# Patient Record
Sex: Female | Born: 1950 | Race: Black or African American | Hispanic: No | Marital: Married | Smoking: Former smoker
Health system: Southern US, Community
[De-identification: ages and names within clinical notes are randomized; demographics above are authoritative.]

## PROBLEM LIST (undated history)

## (undated) DIAGNOSIS — I480 Paroxysmal atrial fibrillation: Secondary | ICD-10-CM

## (undated) DIAGNOSIS — E119 Type 2 diabetes mellitus without complications: Secondary | ICD-10-CM

## (undated) DIAGNOSIS — R7881 Bacteremia: Secondary | ICD-10-CM

## (undated) DIAGNOSIS — G4730 Sleep apnea, unspecified: Secondary | ICD-10-CM

## (undated) DIAGNOSIS — K219 Gastro-esophageal reflux disease without esophagitis: Secondary | ICD-10-CM

## (undated) DIAGNOSIS — J45909 Unspecified asthma, uncomplicated: Secondary | ICD-10-CM

## (undated) DIAGNOSIS — M1990 Unspecified osteoarthritis, unspecified site: Secondary | ICD-10-CM

## (undated) DIAGNOSIS — J449 Chronic obstructive pulmonary disease, unspecified: Secondary | ICD-10-CM

## (undated) DIAGNOSIS — Z9289 Personal history of other medical treatment: Secondary | ICD-10-CM

## (undated) DIAGNOSIS — I10 Essential (primary) hypertension: Secondary | ICD-10-CM

## (undated) DIAGNOSIS — E669 Obesity, unspecified: Secondary | ICD-10-CM

## (undated) DIAGNOSIS — N179 Acute kidney failure, unspecified: Secondary | ICD-10-CM

## (undated) DIAGNOSIS — N809 Endometriosis, unspecified: Secondary | ICD-10-CM

## (undated) HISTORY — DX: Type 2 diabetes mellitus without complications: E11.9

## (undated) HISTORY — DX: Endometriosis, unspecified: N80.9

## (undated) HISTORY — DX: Essential (primary) hypertension: I10

## (undated) HISTORY — DX: Gastro-esophageal reflux disease without esophagitis: K21.9

## (undated) HISTORY — PX: ABDOMINAL HYSTERECTOMY: SHX81

## (undated) HISTORY — DX: Obesity, unspecified: E66.9

## (undated) HISTORY — DX: Sleep apnea, unspecified: G47.30

## (undated) HISTORY — DX: Chronic obstructive pulmonary disease, unspecified: J44.9

## (undated) HISTORY — DX: Unspecified asthma, uncomplicated: J45.909

## (undated) HISTORY — DX: Unspecified osteoarthritis, unspecified site: M19.90

---

## 1997-10-09 ENCOUNTER — Inpatient Hospital Stay (HOSPITAL_COMMUNITY): Admission: RE | Admit: 1997-10-09 | Discharge: 1997-10-10 | Payer: Self-pay | Admitting: Gynecology

## 1998-07-25 ENCOUNTER — Other Ambulatory Visit: Admission: RE | Admit: 1998-07-25 | Discharge: 1998-07-25 | Payer: Self-pay | Admitting: Gynecology

## 1999-06-13 ENCOUNTER — Other Ambulatory Visit: Admission: RE | Admit: 1999-06-13 | Discharge: 1999-06-13 | Payer: Self-pay | Admitting: Gynecology

## 2000-04-14 HISTORY — PX: NASAL SINUS SURGERY: SHX719

## 2000-08-13 ENCOUNTER — Other Ambulatory Visit: Admission: RE | Admit: 2000-08-13 | Discharge: 2000-08-13 | Payer: Self-pay | Admitting: Gynecology

## 2001-07-02 ENCOUNTER — Other Ambulatory Visit: Admission: RE | Admit: 2001-07-02 | Discharge: 2001-07-02 | Payer: Self-pay | Admitting: Gynecology

## 2003-11-20 ENCOUNTER — Other Ambulatory Visit: Payer: Self-pay

## 2004-01-19 ENCOUNTER — Ambulatory Visit: Payer: Self-pay

## 2004-01-30 ENCOUNTER — Ambulatory Visit: Payer: Self-pay

## 2004-02-09 ENCOUNTER — Ambulatory Visit: Payer: Self-pay

## 2004-10-01 ENCOUNTER — Ambulatory Visit: Payer: Self-pay | Admitting: Family Medicine

## 2005-04-05 ENCOUNTER — Emergency Department: Payer: Self-pay | Admitting: Emergency Medicine

## 2005-09-17 ENCOUNTER — Emergency Department: Payer: Self-pay | Admitting: General Practice

## 2005-10-06 ENCOUNTER — Ambulatory Visit: Payer: Self-pay | Admitting: Family Medicine

## 2005-10-30 ENCOUNTER — Encounter: Payer: Self-pay | Admitting: Orthopedic Surgery

## 2005-11-02 ENCOUNTER — Emergency Department: Payer: Self-pay | Admitting: Emergency Medicine

## 2005-11-02 ENCOUNTER — Other Ambulatory Visit: Payer: Self-pay

## 2005-11-12 ENCOUNTER — Encounter: Payer: Self-pay | Admitting: Orthopedic Surgery

## 2005-12-13 ENCOUNTER — Encounter: Payer: Self-pay | Admitting: Orthopedic Surgery

## 2006-03-10 ENCOUNTER — Emergency Department: Payer: Self-pay | Admitting: Emergency Medicine

## 2006-03-11 ENCOUNTER — Other Ambulatory Visit: Payer: Self-pay

## 2006-07-02 ENCOUNTER — Ambulatory Visit: Payer: Self-pay

## 2006-07-04 ENCOUNTER — Ambulatory Visit: Payer: Self-pay

## 2006-09-20 ENCOUNTER — Emergency Department: Payer: Self-pay | Admitting: Emergency Medicine

## 2006-09-20 ENCOUNTER — Other Ambulatory Visit: Payer: Self-pay

## 2006-09-21 DIAGNOSIS — I10 Essential (primary) hypertension: Secondary | ICD-10-CM | POA: Insufficient documentation

## 2006-10-27 ENCOUNTER — Ambulatory Visit: Payer: Self-pay | Admitting: Family Medicine

## 2006-10-29 ENCOUNTER — Ambulatory Visit: Payer: Self-pay | Admitting: Gastroenterology

## 2006-10-29 HISTORY — PX: COLONOSCOPY: SHX174

## 2006-11-03 ENCOUNTER — Ambulatory Visit: Payer: Self-pay | Admitting: Family Medicine

## 2007-09-30 ENCOUNTER — Ambulatory Visit: Payer: Self-pay | Admitting: Family Medicine

## 2009-01-31 ENCOUNTER — Ambulatory Visit: Payer: Self-pay

## 2010-01-22 ENCOUNTER — Ambulatory Visit: Payer: Self-pay

## 2011-01-28 ENCOUNTER — Ambulatory Visit: Payer: Self-pay | Admitting: Family Medicine

## 2011-07-06 ENCOUNTER — Emergency Department: Payer: Self-pay | Admitting: Emergency Medicine

## 2012-03-02 ENCOUNTER — Ambulatory Visit: Payer: Self-pay

## 2013-03-02 ENCOUNTER — Ambulatory Visit: Payer: Self-pay

## 2013-07-26 LAB — HEMOGLOBIN A1C: Hgb A1c MFr Bld: 5.8 % (ref 4.0–6.0)

## 2013-08-01 ENCOUNTER — Ambulatory Visit: Payer: Self-pay | Admitting: Family Medicine

## 2013-08-01 DIAGNOSIS — I369 Nonrheumatic tricuspid valve disorder, unspecified: Secondary | ICD-10-CM

## 2013-08-03 ENCOUNTER — Ambulatory Visit: Payer: Self-pay | Admitting: Family Medicine

## 2013-08-10 ENCOUNTER — Ambulatory Visit: Payer: Self-pay | Admitting: Family Medicine

## 2013-08-11 ENCOUNTER — Other Ambulatory Visit: Payer: Self-pay | Admitting: Family Medicine

## 2013-08-11 LAB — CBC WITH DIFFERENTIAL/PLATELET
BASOS PCT: 1.1 %
Basophil #: 0.1 10*3/uL (ref 0.0–0.1)
EOS ABS: 1.2 10*3/uL — AB (ref 0.0–0.7)
Eosinophil %: 11.6 %
HCT: 42.6 % (ref 35.0–47.0)
HGB: 13.5 g/dL (ref 12.0–16.0)
LYMPHS ABS: 3.2 10*3/uL (ref 1.0–3.6)
Lymphocyte %: 31.6 %
MCH: 25.1 pg — ABNORMAL LOW (ref 26.0–34.0)
MCHC: 31.7 g/dL — ABNORMAL LOW (ref 32.0–36.0)
MCV: 79 fL — ABNORMAL LOW (ref 80–100)
Monocyte #: 0.6 x10 3/mm (ref 0.2–0.9)
Monocyte %: 5.6 %
NEUTROS PCT: 50.1 %
Neutrophil #: 5.1 10*3/uL (ref 1.4–6.5)
Platelet: 300 10*3/uL (ref 150–440)
RBC: 5.37 10*6/uL — ABNORMAL HIGH (ref 3.80–5.20)
RDW: 14.1 % (ref 11.5–14.5)
WBC: 10.2 10*3/uL (ref 3.6–11.0)

## 2013-08-11 LAB — COMPREHENSIVE METABOLIC PANEL
ALT: 26 U/L (ref 12–78)
Albumin: 3.1 g/dL — ABNORMAL LOW (ref 3.4–5.0)
Alkaline Phosphatase: 91 U/L
Anion Gap: 4 — ABNORMAL LOW (ref 7–16)
BILIRUBIN TOTAL: 0.3 mg/dL (ref 0.2–1.0)
BUN: 13 mg/dL (ref 7–18)
CO2: 32 mmol/L (ref 21–32)
Calcium, Total: 9.2 mg/dL (ref 8.5–10.1)
Chloride: 102 mmol/L (ref 98–107)
Creatinine: 0.93 mg/dL (ref 0.60–1.30)
GLUCOSE: 107 mg/dL — AB (ref 65–99)
OSMOLALITY: 276 (ref 275–301)
POTASSIUM: 3.3 mmol/L — AB (ref 3.5–5.1)
SGOT(AST): 27 U/L (ref 15–37)
Sodium: 138 mmol/L (ref 136–145)
Total Protein: 8.8 g/dL — ABNORMAL HIGH (ref 6.4–8.2)

## 2013-08-11 LAB — TSH: THYROID STIMULATING HORM: 1.17 u[IU]/mL

## 2013-11-15 ENCOUNTER — Emergency Department: Payer: Self-pay | Admitting: Emergency Medicine

## 2014-02-07 ENCOUNTER — Ambulatory Visit: Payer: Self-pay | Admitting: Family Medicine

## 2014-05-12 DIAGNOSIS — R0981 Nasal congestion: Secondary | ICD-10-CM | POA: Diagnosis not present

## 2014-05-12 DIAGNOSIS — J328 Other chronic sinusitis: Secondary | ICD-10-CM | POA: Diagnosis not present

## 2014-05-12 DIAGNOSIS — J330 Polyp of nasal cavity: Secondary | ICD-10-CM | POA: Diagnosis not present

## 2014-05-17 DIAGNOSIS — I10 Essential (primary) hypertension: Secondary | ICD-10-CM | POA: Diagnosis not present

## 2014-05-17 DIAGNOSIS — J321 Chronic frontal sinusitis: Secondary | ICD-10-CM | POA: Diagnosis not present

## 2014-05-17 DIAGNOSIS — J320 Chronic maxillary sinusitis: Secondary | ICD-10-CM | POA: Diagnosis not present

## 2014-05-17 DIAGNOSIS — J449 Chronic obstructive pulmonary disease, unspecified: Secondary | ICD-10-CM | POA: Diagnosis not present

## 2014-05-22 LAB — HM MAMMOGRAPHY: HM Mammogram: NORMAL

## 2014-05-29 ENCOUNTER — Ambulatory Visit: Payer: Self-pay | Admitting: Anesthesiology

## 2014-05-29 ENCOUNTER — Ambulatory Visit: Payer: Self-pay | Admitting: Family Medicine

## 2014-05-29 DIAGNOSIS — Z1231 Encounter for screening mammogram for malignant neoplasm of breast: Secondary | ICD-10-CM | POA: Diagnosis not present

## 2014-06-08 ENCOUNTER — Ambulatory Visit: Payer: Self-pay | Admitting: Otolaryngology

## 2014-06-08 DIAGNOSIS — J323 Chronic sphenoidal sinusitis: Secondary | ICD-10-CM | POA: Diagnosis not present

## 2014-06-08 DIAGNOSIS — Z87891 Personal history of nicotine dependence: Secondary | ICD-10-CM | POA: Diagnosis not present

## 2014-06-08 DIAGNOSIS — J320 Chronic maxillary sinusitis: Secondary | ICD-10-CM | POA: Diagnosis not present

## 2014-06-08 DIAGNOSIS — J321 Chronic frontal sinusitis: Secondary | ICD-10-CM | POA: Diagnosis not present

## 2014-06-08 DIAGNOSIS — J449 Chronic obstructive pulmonary disease, unspecified: Secondary | ICD-10-CM | POA: Diagnosis not present

## 2014-06-08 DIAGNOSIS — J329 Chronic sinusitis, unspecified: Secondary | ICD-10-CM | POA: Diagnosis not present

## 2014-06-08 DIAGNOSIS — K219 Gastro-esophageal reflux disease without esophagitis: Secondary | ICD-10-CM | POA: Diagnosis not present

## 2014-06-08 DIAGNOSIS — J338 Other polyp of sinus: Secondary | ICD-10-CM | POA: Diagnosis not present

## 2014-06-08 DIAGNOSIS — J339 Nasal polyp, unspecified: Secondary | ICD-10-CM | POA: Diagnosis not present

## 2014-06-08 DIAGNOSIS — J324 Chronic pansinusitis: Secondary | ICD-10-CM | POA: Diagnosis not present

## 2014-06-08 DIAGNOSIS — I10 Essential (primary) hypertension: Secondary | ICD-10-CM | POA: Diagnosis not present

## 2014-06-08 DIAGNOSIS — G4730 Sleep apnea, unspecified: Secondary | ICD-10-CM | POA: Diagnosis not present

## 2014-06-08 DIAGNOSIS — J322 Chronic ethmoidal sinusitis: Secondary | ICD-10-CM | POA: Diagnosis not present

## 2014-06-16 DIAGNOSIS — J323 Chronic sphenoidal sinusitis: Secondary | ICD-10-CM | POA: Diagnosis not present

## 2014-06-16 DIAGNOSIS — J321 Chronic frontal sinusitis: Secondary | ICD-10-CM | POA: Diagnosis not present

## 2014-06-16 DIAGNOSIS — J320 Chronic maxillary sinusitis: Secondary | ICD-10-CM | POA: Diagnosis not present

## 2014-06-16 DIAGNOSIS — J338 Other polyp of sinus: Secondary | ICD-10-CM | POA: Diagnosis not present

## 2014-06-16 DIAGNOSIS — J322 Chronic ethmoidal sinusitis: Secondary | ICD-10-CM | POA: Diagnosis not present

## 2014-06-22 DIAGNOSIS — J328 Other chronic sinusitis: Secondary | ICD-10-CM | POA: Diagnosis not present

## 2014-06-30 DIAGNOSIS — Z48813 Encounter for surgical aftercare following surgery on the respiratory system: Secondary | ICD-10-CM | POA: Diagnosis not present

## 2014-07-11 DIAGNOSIS — J328 Other chronic sinusitis: Secondary | ICD-10-CM | POA: Diagnosis not present

## 2014-08-07 LAB — SURGICAL PATHOLOGY

## 2014-08-11 DIAGNOSIS — J329 Chronic sinusitis, unspecified: Secondary | ICD-10-CM | POA: Diagnosis not present

## 2014-08-11 DIAGNOSIS — J301 Allergic rhinitis due to pollen: Secondary | ICD-10-CM | POA: Diagnosis not present

## 2014-08-11 DIAGNOSIS — J330 Polyp of nasal cavity: Secondary | ICD-10-CM | POA: Diagnosis not present

## 2014-08-13 NOTE — Op Note (Signed)
PATIENT NAME:  Alexandra Foster, Alexandra Foster MR#:  [id number] DATE OF BIRTH:  04-Mar-1951  DATE OF PROCEDURE:  06/08/2014  PREOPERATIVE DIAGNOSES: Chronic sinusitis, pansinusitis, nasal polyposis, nasal obstruction.   POSTOPERATIVE DIAGNOSES: Chronic sinusitis, pansinusitis, nasal polyposis, nasal obstruction.   PROCEDURES PERFORMED: 1.  Bilateral maxillary antrostomy with tissue removal.  2.  Right frontal sinusotomy.  3.  Left frontal sinusotomy.  4.  Right total ethmoidectomy.  5.  Left total ethmoidectomy.  6.  Right sphenoidotomy.  7.  Left sphenoidotomy.  8.  Image-guided sinus surgery.   SURGEON: Carloyn Manner, M.D.   ANESTHESIA: General endotracheal anesthesia.   ESTIMATED BLOOD LOSS: 300 mL.   INTRAVENOUS FLUIDS: Please see anesthesia record.   COMPLICATIONS: None.   DRAINS/STENT PLACEMENTS: Propel stent and Arista.   INDICATIONS FOR PROCEDURE: The patient is a 64 year old female with a history of chronic sinusitis and severe nasal polyposis with near complete nasal obstruction with disease resistant to medical management.   OPERATIVE FINDINGS: Severe bilateral nasal polyposis with complete obstruction of bilateral maxillary antrostomies, complete opacification of bilateral maxillary sinuses with polyps within the antrostomy and sinus itself, complete obliteration of the ethmoids, as well as the frontal sinus outflow tracts bilaterally with nasal polyposis and obstruction of the sphenoid sinus with nasal polyposis, bilateral wide maxillary antrostomies, total ethmoidectomies and sphenoidotomies and frontal sinuses that was performed. Decision was made to stop the procedure with some polypoid tissue along the fovea ethmoidalis remaining secondary to blood loss.   DESCRIPTION OF PROCEDURE: After the patient was identified in holding, the benefits and risks of the procedure were discussed and consent was reviewed. The patient was taken to the operating room and placed in the supine  position. General endotracheal anesthesia was induced. The patient was rotated to 45 degrees and a shoulder roll was placed. The Stryker image-guided sinus system was set up and calibrated in normal fashion with an acceptable error of 1 mm. This was referred to throughout the duration of the case for proper positioning and opened up the diseased sinus contents. At this time, 5 mL of 1% lidocaine with 1:100,000 epinephrine were injected into the patient's anterior septum and anterior/inferior turbinate and columella, and Afrin-soaked pledgets were placed in the patient's nasal cavity. The patient was prepped and draped in sterile fashion and a 0-degree endoscope was brought into the field. This demonstrated nasal polyposis with complete obstruction of the bilateral nasal cavities from the columella all the way back to the nasopharynx. Using a Diego microdebrider and straight biting forceps, the polyps were debulked and debrided until the posterior nasopharynx was identified. Care was taken to avoid injury to the eustachian tube orifice. At this time, the [facility] microdebrider and straight biting forceps were used to superiorly, was brought up superiorly for evaluation until the middle turbinate was identified. There was a large middle turbinate on the right side with some polypoid degeneration on it. This was removed. On the left side, there was a smaller middle turbinate and some fragments of the ethmoid bulla were noted in this area, as well; as well as some fragments of the uncinate process which had been on the left side plastered down and on the right brought forward by the severe nasal polyposis from the maxillary antrostomy. At this time, Acclarent balloon sinuplasty device was used on the right frontal sinus outflow tract. This was inflated sequentially from the right frontal sinus. This demonstrated a widely patent frontal sinus outflow tract, and then using a 45-degree up-biting forceps the outflow  was  carefully picked and the polyps were removed demonstrating a widely patent frontal sinus tract. On the left side, this also was performed with Acclarent balloon sinuplasty device and then nasal polyposis was removed from the sinus outflow tract, as well as the remnants of the agir nasi cell on the left side resulting in a widely patent frontal sinusotomy bilaterally.   At this time, attention was directed to  maxillary antrostomy on the right. Using Acclarent balloon sinuplasty device, the maxillary antrostomy residual ostia was identified. This was sequentially dilated. This demonstrated significant polyps emanating from the right maxillary cavity. This was removed with a 45-degree and straight biting forceps. Using a 30-degree scope, more polyps were removed from the antrostomy and then [facility] microdebrider was used to  enlarge the antrostomy. There was a significant amount of osteitic bone there. At this time, the anterior ethmoid was entered on the right side. There was a significant amount of nasal polyposis pretty much filling the entire anterior and posterior ethmoid with no residual delineation between the two. The nasal polyps were fibrous in nature and with significant amount of bleeding. These were slowly debulked with hemostasis with Afrin-soaked pledgets. This was taken more posteriorly until the face of the sphenoid was encountered, as well as the superior turbinate  had been plastered medially. The sphenoid os was entered with straight suction and polypoid tissue was removed from the sphenoid os. Hemostasis was achieved using topical pledgets.   At this time, attention was directed to the patient's left sinus. In a similar fashion, the left maxillary antrostomy was entered using an Acclarent balloon sinuplasty device. This was sequentially dilated and then nasal polyps were removed from the antrostomy, as well as within the left maxillary sinus itself. Significant amount of thickened mucus behind  the polyps was noted, as well. Diego microdebrider was used to enlarge the antrostomy and connect to the natural os. Again, significant amount of osteitic bone.   Attention was directed to the polyps filling the anterior ethmoid on the left side. At this time, beginning in an inferior and medial position, the left anterior ethmoid was entered. This was debrided superiorly and then laterally using a Diego microdebrider. The superior turbinate was identified and the sphenoid os was entered using a straight image-guided sinus suction, and then polyps around the sphenoid os were removed, and hemostasis was achieved using Afrin-soaked pledgets. At this time, the EBL was estimated at approximately 300 mL. Given the fact that all 8 sinuses had been opened, she did have some residual tissue and polyps along the superior ethmoid cavity bilaterally. At this time, decision was made to place Propel stents and possibly stage the surgery due to increased blood loss, as well as lack of residual landmarks. At this time, the Afrin-soaked pledgets were removed from the patient's nasal cavity and a standard-sized Propel stent was placed bilaterally endoscopically, and then Arista was placed along the sphenoid os and ethmoid cavities bilaterally, as well as the maxillary antrostomy bilaterally. This was suctioned from the nasopharynx with good hemostasis. At this time, care of the patient was transferred to anesthesia.   ____________________________ Jerene Bears, MD ccv:am D: 06/08/2014 10:40:17 ET T: 06/08/2014 22:33:53 ET JOB#: [id number]  cc: Jerene Bears, MD, <Dictator> Jerene Bears MD ELECTRONICALLY SIGNED 07/05/2014 9:55

## 2014-09-18 ENCOUNTER — Ambulatory Visit (INDEPENDENT_AMBULATORY_CARE_PROVIDER_SITE_OTHER): Payer: Commercial Managed Care - HMO | Admitting: Family Medicine

## 2014-09-18 ENCOUNTER — Encounter (INDEPENDENT_AMBULATORY_CARE_PROVIDER_SITE_OTHER): Payer: Self-pay

## 2014-09-18 ENCOUNTER — Encounter: Payer: Self-pay | Admitting: Family Medicine

## 2014-09-18 VITALS — BP 130/76 | HR 104 | Temp 98.6°F | Resp 18 | Ht 64.0 in | Wt 257.0 lb

## 2014-09-18 DIAGNOSIS — J440 Chronic obstructive pulmonary disease with acute lower respiratory infection: Secondary | ICD-10-CM

## 2014-09-18 DIAGNOSIS — K2100 Gastro-esophageal reflux disease with esophagitis, without bleeding: Secondary | ICD-10-CM | POA: Insufficient documentation

## 2014-09-18 DIAGNOSIS — G4733 Obstructive sleep apnea (adult) (pediatric): Secondary | ICD-10-CM

## 2014-09-18 DIAGNOSIS — I10 Essential (primary) hypertension: Secondary | ICD-10-CM

## 2014-09-18 DIAGNOSIS — I779 Disorder of arteries and arterioles, unspecified: Secondary | ICD-10-CM | POA: Insufficient documentation

## 2014-09-18 DIAGNOSIS — I7090 Unspecified atherosclerosis: Secondary | ICD-10-CM | POA: Insufficient documentation

## 2014-09-18 DIAGNOSIS — J449 Chronic obstructive pulmonary disease, unspecified: Secondary | ICD-10-CM | POA: Insufficient documentation

## 2014-09-18 DIAGNOSIS — I6529 Occlusion and stenosis of unspecified carotid artery: Secondary | ICD-10-CM | POA: Diagnosis not present

## 2014-09-18 NOTE — Progress Notes (Signed)
Name: Alexandra Foster   MRN: [id number]    DOB: 1950/07/17   Date:09/18/2014       Progress Note  Subjective  Chief Complaint  Chief Complaint  Patient presents with  . Hypertension  . COPD    Hypertension This is a chronic problem. The current episode started more than 1 year ago. The problem has been gradually improving since onset. Associated symptoms include anxiety, peripheral edema and shortness of breath. Pertinent negatives include no blurred vision, chest pain, headaches, neck pain, orthopnea, palpitations or PND. Risk factors for coronary artery disease include dyslipidemia, obesity and sedentary lifestyle. Past treatments include diuretics. The current treatment provides moderate improvement. Compliance problems include medication cost.  Hypertensive end-organ damage includes kidney disease.  Arthritis Presents for follow-up visit. She complains of pain and joint warmth. Affected locations include the right knee, left knee, right PIP, left PIP, left DIP and right DIP. Her pain is at a severity of 4/10. Pertinent negatives include no diarrhea, dysuria, fever or weight loss. Her past medical history is significant for osteoarthritis. Past treatments include nothing. Factors aggravating her arthritis include activity. Compliance problems include medication cost.     COPD Currently off inhalers 2nd to cost and is having more respiratory difficulty in relation to seasonal changes.   SLEEP APNEA Diagnosed with OSA many years ago, but never got CPAP. NOW has edema, sleep disturbance, memory problems.  EDEMA LOWER EXTREMITY EDEMA minimally responsive to diuretic.  OSA probably contributing  No past medical history on file.  History  Substance Use Topics  . Smoking status: Former Smoker    Quit date: 01/05/2009  . Smokeless tobacco: Not on file  . Alcohol Use: No     Current outpatient prescriptions:  .  albuterol (PROVENTIL HFA;VENTOLIN HFA) 108 (90 BASE) MCG/ACT inhaler,  Inhale into the lungs., Disp: , Rfl:  .  beclomethasone (QVAR) 80 MCG/ACT inhaler, Inhale into the lungs., Disp: , Rfl:  .  Potassium Chloride ER 20 MEQ TBCR, Take by mouth., Disp: , Rfl:  .  triamterene-hydrochlorothiazide (MAXZIDE-25) 37.5-25 MG per tablet, 1 tablet., Disp: , Rfl:   No Known Allergies  Review of Systems  Constitutional: Negative.  Negative for fever, chills and weight loss.  HENT: Negative.  Negative for congestion, hearing loss, sore throat and tinnitus.   Eyes: Negative.  Negative for blurred vision, double vision and redness.  Respiratory: Positive for shortness of breath and wheezing. Negative for cough and hemoptysis.   Cardiovascular: Positive for leg swelling. Negative for chest pain, palpitations, orthopnea, claudication and PND.  Gastrointestinal: Negative for heartburn, nausea, vomiting, diarrhea, constipation and blood in stool.  Genitourinary: Negative.  Negative for dysuria, urgency, frequency and hematuria.  Musculoskeletal: Positive for joint pain, falls and arthritis. Negative for myalgias, back pain and neck pain.  Skin: Negative for itching.  Neurological: Negative for dizziness, tingling, tremors, focal weakness, seizures, loss of consciousness, weakness and headaches.  Endo/Heme/Allergies: Does not bruise/bleed easily.  Psychiatric/Behavioral: Positive for memory loss. Negative for depression and substance abuse. The patient is not nervous/anxious and does not have insomnia.       Objective  Filed Vitals:   09/18/14 0843  BP: 130/76  Pulse: 104  Temp: 98.6 F (37 C)  TempSrc: Oral  Resp: 18  Height: 5' 4" (1.626 m)  Weight: 257 lb (116.574 kg)  SpO2: 98%     Physical Exam  Constitutional: She is oriented to person, place, and time and well-developed, well-nourished, and in no distress.  HENT:  Head: Normocephalic and atraumatic.  Eyes: EOM are normal. Pupils are equal, round, and reactive to light.  Neck: Normal range of motion. No  thyromegaly present.  Cardiovascular: Normal rate, regular rhythm and normal heart sounds.   No murmur heard. Pulmonary/Chest: Effort normal and breath sounds normal. No respiratory distress.  Abdominal: Soft. Bowel sounds are normal.  Musculoskeletal: Normal range of motion. She exhibits no edema.  Neurological: She is alert and oriented to person, place, and time. No cranial nerve deficit. Gait normal.  Skin: Skin is warm and dry. No rash noted.  Psychiatric: Memory and affect normal.       Assessment & Plan  1. Essential hypertension Well-controlled - Potassium Chloride ER 20 MEQ TBCR; Take by mouth. - triamterene-hydrochlorothiazide (MAXZIDE-25) 37.5-25 MG per tablet; 1 tablet. - Lipid Profile - Comp Met (CMET) - TSH  2. Obstructive chronic bronchitis with acute bronchitis Stable - albuterol (PROVENTIL HFA;VENTOLIN HFA) 108 (90 BASE) MCG/ACT inhaler; Inhale into the lungs. - beclomethasone (QVAR) 80 MCG/ACT inhaler; Inhale into the lungs.  3. Carotid artery plaque, unspecified laterality Continue to monitor - Lipid Profile  4. OSA (obstructive sleep apnea) New sleep study - Ambulatory referral to Sleep Studies.

## 2014-09-18 NOTE — Patient Instructions (Signed)
F/u 4 mo

## 2014-10-02 ENCOUNTER — Other Ambulatory Visit: Payer: Self-pay | Admitting: Family Medicine

## 2014-10-28 ENCOUNTER — Other Ambulatory Visit: Payer: Self-pay | Admitting: Family Medicine

## 2014-10-31 NOTE — Telephone Encounter (Signed)
rf only one as they are the same dyazide

## 2014-11-10 DIAGNOSIS — J301 Allergic rhinitis due to pollen: Secondary | ICD-10-CM | POA: Diagnosis not present

## 2014-11-10 DIAGNOSIS — J330 Polyp of nasal cavity: Secondary | ICD-10-CM | POA: Diagnosis not present

## 2014-12-01 ENCOUNTER — Other Ambulatory Visit: Payer: Self-pay | Admitting: Family Medicine

## 2014-12-29 ENCOUNTER — Other Ambulatory Visit: Payer: Self-pay | Admitting: Family Medicine

## 2015-01-29 ENCOUNTER — Other Ambulatory Visit: Payer: Self-pay | Admitting: Family Medicine

## 2015-03-07 ENCOUNTER — Other Ambulatory Visit: Payer: Self-pay | Admitting: Family Medicine

## 2015-03-27 ENCOUNTER — Other Ambulatory Visit: Payer: Self-pay

## 2015-03-28 ENCOUNTER — Telehealth: Payer: Self-pay | Admitting: Family Medicine

## 2015-03-28 MED ORDER — TRIAMTERENE-HCTZ 37.5-25 MG PO CAPS: 1.0000 | ORAL_CAPSULE | Freq: Every day | ORAL | Status: DC

## 2015-03-28 NOTE — Telephone Encounter (Signed)
Alexandra Foster from [facility] is requesting refill on Trimterene and Omeprazole.

## 2015-03-28 NOTE — Telephone Encounter (Signed)
Omeprazole is not in Epic medication list and it is not on pt medication list in allscripts. I did send refill for triamterene HCTZ.

## 2015-04-13 ENCOUNTER — Encounter: Payer: Self-pay | Admitting: Family Medicine

## 2015-04-13 ENCOUNTER — Ambulatory Visit (INDEPENDENT_AMBULATORY_CARE_PROVIDER_SITE_OTHER): Payer: Commercial Managed Care - HMO | Admitting: Family Medicine

## 2015-04-13 ENCOUNTER — Other Ambulatory Visit: Payer: Self-pay | Admitting: Family Medicine

## 2015-04-13 VITALS — BP 128/78 | HR 88 | Temp 98.3°F | Resp 16 | Wt 258.8 lb

## 2015-04-13 DIAGNOSIS — J440 Chronic obstructive pulmonary disease with acute lower respiratory infection: Secondary | ICD-10-CM | POA: Diagnosis not present

## 2015-04-13 DIAGNOSIS — I10 Essential (primary) hypertension: Secondary | ICD-10-CM | POA: Diagnosis not present

## 2015-04-13 DIAGNOSIS — J302 Other seasonal allergic rhinitis: Secondary | ICD-10-CM

## 2015-04-13 DIAGNOSIS — J309 Allergic rhinitis, unspecified: Secondary | ICD-10-CM | POA: Insufficient documentation

## 2015-04-13 MED ORDER — TRIAMTERENE-HCTZ 37.5-25 MG PO CAPS: 1.0000 | ORAL_CAPSULE | Freq: Every day | ORAL | Status: DC

## 2015-04-13 MED ORDER — PREDNISONE 10 MG (21) PO TBPK: ORAL_TABLET | ORAL | Status: DC

## 2015-04-13 MED ORDER — HYDROCOD POLST-CPM POLST ER 10-8 MG/5ML PO SUER: 5.0000 mL | Freq: Two times a day (BID) | ORAL | Status: DC | PRN

## 2015-04-13 MED ORDER — AMOXICILLIN-POT CLAVULANATE 875-125 MG PO TABS: 1.0000 | ORAL_TABLET | Freq: Two times a day (BID) | ORAL | Status: DC

## 2015-04-13 NOTE — Patient Instructions (Signed)
1) START USING QVAR INHALER EVERY DAY  2) ONLY START SYMBICORT INHALER IF STILL FEELING SHORT OF BREATH AFTER USING QVAR CONSISTENTLY FOR 1 WEEK  3) USE NEBULIZER ALBUTEROL MORNING AND EVENING WHEN WHEEZING THEN WEAN DOWN TO AS NEEDED

## 2015-04-13 NOTE — Progress Notes (Signed)
Name: Alexandra Foster   MRN: [id number]    DOB: [birth year]   Date:04/13/2015       Progress Note  Subjective  Chief Complaint  Chief Complaint  Patient presents with  . Bronchitis  . Medication Refill    Dyazide    HPI  Patient is here today with concerns regarding the following symptoms wheezing, sore throat, congestion, post nasal drip, coryza, sinus pressure, productive cough and achiness that started about 2 weeks ago.  Associated with fatigue and malaise. No fevers or headaches. Has tried the following home remedies: nasal saline wash. Not using her Qvar or Symbicort every day as she states she didn't know they were every day medications. Did have sinus surgery with Dr. Pryor Ochoa to help open up her breathing.   Needs refill of Dyazide for her HTN. Taking this every day as instructed with no problems.    Past Medical History  Diagnosis Date  . COPD (chronic obstructive pulmonary disease) ([place])   . Hypertension     Social History  Substance Use Topics  . Smoking status: Former Smoker    Quit date: 01/05/2009  . Smokeless tobacco: Not on file  . Alcohol Use: No     Current outpatient prescriptions:  .  albuterol (PROVENTIL HFA;VENTOLIN HFA) 108 (90 BASE) MCG/ACT inhaler, Inhale into the lungs., Disp: , Rfl:  .  beclomethasone (QVAR) 80 MCG/ACT inhaler, Inhale into the lungs., Disp: , Rfl:  .  triamterene-hydrochlorothiazide (DYAZIDE) 37.5-25 MG capsule, Take 1 each (1 capsule total) by mouth daily., Disp: 90 capsule, Rfl: 0 .  Potassium Chloride ER 20 MEQ TBCR, Take by mouth., Disp: , Rfl:   No Known Allergies  ROS  Positive for fatigue, nasal congestion, sinus pressure, ear fullness, cough as mentioned in HPI, otherwise all systems reviewed and are negative.  Objective  Filed Vitals:   04/13/15 1410  BP: 128/78  Pulse: 88  Temp: 98.3 F (36.8 C)  TempSrc: Oral  Resp: 16  Weight: 258 lb 12.8 oz (117.391 kg)  SpO2: 98%   Body mass index is 44.4  kg/(m^2).   Physical Exam  Constitutional: Patient is obese and well-nourished. In no acute distress but does appear to be fatigued from acute illness. HEENT:  - Head: Normocephalic and atraumatic.  - Ears: RIGHT TM bulging with minimal clear exudate, LEFT TM bulging with minimal clear exudate.  - Nose: Nasal mucosa boggy and congested.  - Mouth/Throat: Oropharynx is moist with slight erythema of bilateral tonsils without hypertrophy or exudates. Post nasal drainage present.  - Eyes: Conjunctivae clear, EOM movements normal. PERRLA. No scleral icterus.  Neck: Normal range of motion. Neck supple. No JVD present. No thyromegaly present. No local lymphadenopathy. Cardiovascular: Regular rate, regular rhythm with no murmurs heard.  Pulmonary/Chest: Effort normal and breath sounds reduced with end exp wheezing.  Musculoskeletal: Normal range of motion bilateral UE and LE, no joint effusions. Skin: Skin is warm and dry. No rash noted. Psychiatric: Patient has a funny laughing mood and affect. Behavior is normal in office today. Judgment and thought content normal in office today.   Assessment & Plan  1. Essential hypertension Refilled.  - triamterene-hydrochlorothiazide (DYAZIDE) 37.5-25 MG capsule; Take 1 each (1 capsule total) by mouth daily.  Dispense: 90 capsule; Refill: 1  2. Obstructive chronic bronchitis with acute bronchitis (HCC) Pulse ox on room air within reasonable limits. Etiologies include initial allergic rhinitis or viral infection progressing to superimposed bacterial infection. Instructed patient on increasing hydration, nasal saline spray,  steam inhalation, NSAID if tolerated and not contraindicated. If not already doing so start taking daily anti-histamine and use a steroid nasal spray.   Instructed patient to start using her inhalers properly.   - amoxicillin-clavulanate (AUGMENTIN) 875-125 MG tablet; Take 1 tablet by mouth 2 (two) times daily.  Dispense: 20 tablet;  Refill: 0 - predniSONE (STERAPRED UNI-PAK 21 TAB) 10 MG (21) TBPK tablet; Use as directed in a 6 day taper PredPak  Dispense: 21 tablet; Refill: 0 - chlorpheniramine-HYDROcodone (TUSSIONEX PENNKINETIC ER) 10-8 MG/5ML SUER; Take 5 mLs by mouth every 12 (twelve) hours as needed.  Dispense: 115 mL; Refill: 0

## 2015-05-03 DIAGNOSIS — J324 Chronic pansinusitis: Secondary | ICD-10-CM | POA: Diagnosis not present

## 2015-05-03 DIAGNOSIS — B379 Candidiasis, unspecified: Secondary | ICD-10-CM | POA: Diagnosis not present

## 2015-05-03 DIAGNOSIS — J330 Polyp of nasal cavity: Secondary | ICD-10-CM | POA: Diagnosis not present

## 2015-05-24 ENCOUNTER — Other Ambulatory Visit: Payer: Self-pay | Admitting: Family Medicine

## 2015-07-03 ENCOUNTER — Telehealth: Payer: Self-pay | Admitting: Family Medicine

## 2015-07-03 NOTE — Telephone Encounter (Signed)
Please send Triamterene HCTZ and her reflux medication to [facility]. Please call patient once complete.

## 2015-07-04 ENCOUNTER — Other Ambulatory Visit: Payer: Self-pay

## 2015-07-04 DIAGNOSIS — I10 Essential (primary) hypertension: Secondary | ICD-10-CM

## 2015-07-10 ENCOUNTER — Other Ambulatory Visit: Payer: Self-pay

## 2015-07-10 DIAGNOSIS — I10 Essential (primary) hypertension: Secondary | ICD-10-CM

## 2015-07-10 DIAGNOSIS — J440 Chronic obstructive pulmonary disease with acute lower respiratory infection: Secondary | ICD-10-CM

## 2015-07-10 MED ORDER — TRIAMTERENE-HCTZ 37.5-25 MG PO CAPS: 1.0000 | ORAL_CAPSULE | Freq: Every day | ORAL | Status: DC

## 2015-07-11 DIAGNOSIS — H5203 Hypermetropia, bilateral: Secondary | ICD-10-CM | POA: Diagnosis not present

## 2015-07-14 DIAGNOSIS — Z0100 Encounter for examination of eyes and vision without abnormal findings: Secondary | ICD-10-CM | POA: Diagnosis not present

## 2015-07-18 ENCOUNTER — Other Ambulatory Visit: Payer: Self-pay

## 2015-07-18 DIAGNOSIS — I10 Essential (primary) hypertension: Secondary | ICD-10-CM

## 2015-07-18 MED ORDER — TRIAMTERENE-HCTZ 37.5-25 MG PO CAPS: 1.0000 | ORAL_CAPSULE | Freq: Every day | ORAL | Status: DC

## 2015-07-20 ENCOUNTER — Telehealth: Payer: Self-pay

## 2015-07-20 NOTE — Telephone Encounter (Signed)
Pt stated that you would know what she ws on for her acid reflux and that she needs a refill for it

## 2015-07-26 ENCOUNTER — Other Ambulatory Visit: Payer: Self-pay | Admitting: Family Medicine

## 2015-07-26 MED ORDER — OMEPRAZOLE 20 MG PO CPDR: 20.0000 mg | DELAYED_RELEASE_CAPSULE | Freq: Every day | ORAL | Status: DC

## 2015-08-31 DIAGNOSIS — J324 Chronic pansinusitis: Secondary | ICD-10-CM | POA: Diagnosis not present

## 2015-08-31 DIAGNOSIS — J331 Polypoid sinus degeneration: Secondary | ICD-10-CM | POA: Diagnosis not present

## 2015-10-03 DIAGNOSIS — J331 Polypoid sinus degeneration: Secondary | ICD-10-CM | POA: Diagnosis not present

## 2015-10-03 DIAGNOSIS — J324 Chronic pansinusitis: Secondary | ICD-10-CM | POA: Diagnosis not present

## 2015-12-24 ENCOUNTER — Other Ambulatory Visit: Payer: Self-pay | Admitting: Family Medicine

## 2015-12-24 DIAGNOSIS — I10 Essential (primary) hypertension: Secondary | ICD-10-CM

## 2016-01-03 DIAGNOSIS — J324 Chronic pansinusitis: Secondary | ICD-10-CM | POA: Diagnosis not present

## 2016-01-03 DIAGNOSIS — J331 Polypoid sinus degeneration: Secondary | ICD-10-CM | POA: Diagnosis not present

## 2016-01-03 DIAGNOSIS — J301 Allergic rhinitis due to pollen: Secondary | ICD-10-CM | POA: Diagnosis not present

## 2016-01-08 ENCOUNTER — Ambulatory Visit (INDEPENDENT_AMBULATORY_CARE_PROVIDER_SITE_OTHER): Payer: Commercial Managed Care - HMO | Admitting: Family Medicine

## 2016-01-08 ENCOUNTER — Encounter: Payer: Self-pay | Admitting: Family Medicine

## 2016-01-08 VITALS — BP 118/78 | HR 84 | Temp 98.1°F | Wt 266.7 lb

## 2016-01-08 DIAGNOSIS — I6529 Occlusion and stenosis of unspecified carotid artery: Secondary | ICD-10-CM | POA: Diagnosis not present

## 2016-01-08 DIAGNOSIS — I10 Essential (primary) hypertension: Secondary | ICD-10-CM | POA: Diagnosis not present

## 2016-01-08 DIAGNOSIS — G4733 Obstructive sleep apnea (adult) (pediatric): Secondary | ICD-10-CM | POA: Diagnosis not present

## 2016-01-08 DIAGNOSIS — Z1231 Encounter for screening mammogram for malignant neoplasm of breast: Secondary | ICD-10-CM | POA: Diagnosis not present

## 2016-01-08 DIAGNOSIS — Z79899 Other long term (current) drug therapy: Secondary | ICD-10-CM

## 2016-01-08 DIAGNOSIS — K219 Gastro-esophageal reflux disease without esophagitis: Secondary | ICD-10-CM

## 2016-01-08 DIAGNOSIS — Z1159 Encounter for screening for other viral diseases: Secondary | ICD-10-CM

## 2016-01-08 DIAGNOSIS — J411 Mucopurulent chronic bronchitis: Secondary | ICD-10-CM | POA: Diagnosis not present

## 2016-01-08 DIAGNOSIS — Z23 Encounter for immunization: Secondary | ICD-10-CM

## 2016-01-08 DIAGNOSIS — E2839 Other primary ovarian failure: Secondary | ICD-10-CM

## 2016-01-08 DIAGNOSIS — Z0001 Encounter for general adult medical examination with abnormal findings: Secondary | ICD-10-CM | POA: Diagnosis not present

## 2016-01-08 DIAGNOSIS — Z1211 Encounter for screening for malignant neoplasm of colon: Secondary | ICD-10-CM

## 2016-01-08 DIAGNOSIS — Z0000 Encounter for general adult medical examination without abnormal findings: Secondary | ICD-10-CM

## 2016-01-08 MED ORDER — TRIAMTERENE-HCTZ 37.5-25 MG PO CAPS: 1.0000 | ORAL_CAPSULE | Freq: Every day | ORAL | 1 refills | Status: DC

## 2016-01-08 MED ORDER — ASPIRIN 81 MG PO TABS: 81.0000 mg | ORAL_TABLET | Freq: Every day | ORAL | 0 refills | Status: DC

## 2016-01-08 MED ORDER — OMEPRAZOLE 20 MG PO CPDR: 20.0000 mg | DELAYED_RELEASE_CAPSULE | Freq: Every day | ORAL | 1 refills | Status: DC

## 2016-01-08 MED ORDER — BUDESONIDE-FORMOTEROL FUMARATE 80-4.5 MCG/ACT IN AERO: 2.0000 | INHALATION_SPRAY | Freq: Two times a day (BID) | RESPIRATORY_TRACT | 1 refills | Status: DC

## 2016-01-08 MED ORDER — ATORVASTATIN CALCIUM 40 MG PO TABS: 40.0000 mg | ORAL_TABLET | Freq: Every day | ORAL | 3 refills | Status: DC

## 2016-01-08 NOTE — Progress Notes (Signed)
Name: Alexandra Foster   MRN: [id number]    DOB: 07/03/1950   Date:01/08/2016       Progress Note  Subjective  Chief Complaint  Chief Complaint  Patient presents with  . Annual Exam  . Medication Refill    HPI  Functional ability/safety issues: No Issues Hearing issues: Addressed  Activities of daily living: Discussed Home safety issues: No Issues  End Of Life Planning: Offered verbal information regarding advanced directives, healthcare power of attorney.  Preventative care, Health maintenance, Preventative health measures discussed.  Preventative screenings discussed today: lab work, colonoscopy,  mammogram, DEXA.  Low Dose CT Chest recommended if Age 28-80 years, 30 pack-year currently smoking OR have quit w/in 15years. Refused  Lifestyle risk factor issued reviewed: Diet, exercise, weight management, advised patient smoking is not healthy, nutrition/diet.  Preventative health measures discussed (5-10 year plan).  Reviewed and recommended vaccinations: - Pneumovax -refused - Prevnar -refused - Annual Influenza -refused - Zostavax -refused - Tdap   Depression screening: Done Fall risk screening: Done Discuss ADLs/IADLs: Done  Current medical providers: See HPI  Other health risk factors identified this visit: No other issues Cognitive impairment issues: None identified  All above discussed with patient. Appropriate education, counseling and referral will be made based upon the above.   HTN: she is taking bp medication and denies side effects, no chest pain, no palpitation  Chronic bronchitis: she quit smoking about 10 years ago, she has been out of Symbicort , she states she coughs when hot, and also every morning when she brushes her teeth, she has SOB with activity, no wheezing, no orthopnea  Obesity: she has a BMI of over 45 with co-morbidities, she skips breakfast, eats junk food and drinks sodas almost 60 ounces of sodas daily.   OSA: she had sleep  study done, but could not afford CPAP supplies, discussed increase risk of heart attacks and strokes with OSA that is not treated.   GERD: she has been taking Omeprazole, explained the risk of colitis, alzheimer's and bone loss associated with long term use of PPI, but she states she is unable to stop it , since when she tries it caused regurgitation and heartburn     Patient Active Problem List   Diagnosis Date Noted  . OSA (obstructive sleep apnea) 01/08/2016  . Allergic rhinitis 04/13/2015  . Arterial vascular disease 09/18/2014  . Carotid artery plaque 09/18/2014  . COPD (chronic obstructive pulmonary disease) ([place]) 09/18/2014  . Morbid obesity ([place]) 09/18/2014  . Esophagitis, reflux 09/18/2014  . Essential hypertension 09/21/2006    History reviewed. No pertinent surgical history.  Family History  Problem Relation Age of Onset  . Congestive Heart Failure Mother   . Coronary artery disease Father     Social History   Social History  . Marital status: Married    Spouse name: N/A  . Number of children: N/A  . Years of education: N/A   Occupational History  . Not on file.   Social History Main Topics  . Smoking status: Former Smoker    Quit date: 01/05/2009  . Smokeless tobacco: Former Systems developer    Types: Snuff    Quit date: 01/07/2006  . Alcohol use No  . Drug use: No  . Sexual activity: No   Other Topics Concern  . Not on file   Social History Narrative  . No narrative on file     Current Outpatient Prescriptions:  .  albuterol (PROVENTIL HFA;VENTOLIN HFA) 108 (90 BASE) MCG/ACT  inhaler, Inhale into the lungs., Disp: , Rfl:  .  budesonide-formoterol (SYMBICORT) 80-4.5 MCG/ACT inhaler, Inhale 2 puffs into the lungs 2 (two) times daily., Disp: 3 Inhaler, Rfl: 1 .  omeprazole (PRILOSEC) 20 MG capsule, Take 1 capsule (20 mg total) by mouth daily., Disp: 90 capsule, Rfl: 1 .  triamterene-hydrochlorothiazide (DYAZIDE) 37.5-25 MG capsule, Take 1 each (1 capsule total)  by mouth daily., Disp: 90 capsule, Rfl: 1  No Known Allergies   ROS  Constitutional: Negative for fever , positive for  weight change.  Respiratory: Positive for cough and shortness of breath.   Cardiovascular: Negative for chest pain or palpitations.  Gastrointestinal: Negative for abdominal pain, no bowel changes.  Musculoskeletal: Positive for gait problem ( secondary to pain ) or joint swelling.  Skin: Negative for rash.  Neurological: Negative for dizziness or headache.  No other specific complaints in a complete review of systems (except as listed in HPI above).  Objective  Vitals:   01/08/16 1506  BP: 118/78  Pulse: 84  Temp: 98.1 F (36.7 C)  SpO2: 98%  Weight: 266 lb 11.2 oz (121 kg)    Body mass index is 45.78 kg/m.  Physical Exam  Constitutional: Patient appears well-developed and well-nourished. No distress.  HENT: Head: Normocephalic and atraumatic. Ears: B TMs ok, no erythema or effusion; Nose: Nose normal. Mouth/Throat: Oropharynx is clear and moist. No oropharyngeal exudate.  Eyes: Conjunctivae and EOM are normal. Pupils are equal, round, and reactive to light. No scleral icterus.  Neck: Normal range of motion. Neck supple. No JVD present. No thyromegaly present.  Cardiovascular: Normal rate, regular rhythm and normal heart sounds.  No murmur heard. No BLE edema. Pulmonary/Chest: Effort normal and breath sounds normal. No respiratory distress. Abdominal: Soft. Bowel sounds are normal, no distension. There is no tenderness. no masses Breast: no lumps or masses, no nipple discharge or rashes FEMALE GENITALIA:  External genitalia normal External urethra normal Vaginal vault normal without discharge or lesions Cervix normal without discharge or lesions Bimanual exam normal without masses RECTAL: no rectal masses or hemorrhoids Musculoskeletal: Normal range of motion, no joint effusions. No gross deformities Neurological: he is alert and oriented to person,  place, and time. No cranial nerve deficit. Coordination, balance, strength, speech and gait are normal.  Skin: Skin is warm and dry. No rash noted. No erythema.  Psychiatric: Patient has a normal mood and affect. behavior is normal. Judgment and thought content normal.   PHQ2/9: Depression screen [facility] 2/9 01/08/2016 04/13/2015  Decreased Interest 0 0  Down, Depressed, Hopeless 0 0  PHQ - 2 Score 0 0     Fall Risk: Fall Risk  01/08/2016 04/13/2015  Falls in the past year? No No      Functional Status Survey: Is the patient deaf or have difficulty hearing?: No Does the patient have difficulty seeing, even when wearing glasses/contacts?: Yes (glasses) Does the patient have difficulty concentrating, remembering, or making decisions?: No Does the patient have difficulty walking or climbing stairs?: No Does the patient have difficulty dressing or bathing?: No Does the patient have difficulty doing errands alone such as visiting a doctor's office or shopping?: No    Assessment & Plan  1. Medicare annual wellness visit, subsequent  Discussed importance of 150 minutes of physical activity weekly, eat two servings of fish weekly, eat one serving of tree nuts ( cashews, pistachios, pecans, almonds.[name]) every other day, eat 6 servings of fruit/vegetables daily and drink plenty of water and avoid sweet beverages.  2. Essential hypertension  - triamterene-hydrochlorothiazide (DYAZIDE) 37.5-25 MG capsule; Take 1 each (1 capsule total) by mouth daily.  Dispense: 90 capsule; Refill: 1 - CBC with Differential/Platelet - COMPLETE METABOLIC PANEL WITH GFR - Lipid panel  3. OSA (obstructive sleep apnea)  -sleep study   4. Morbid obesity, unspecified obesity type [facility])  Discussed with the patient the risk posed by an increased BMI. Discussed importance of portion control, calorie counting and at least 150 minutes of physical activity weekly. Avoid sweet beverages and drink more water. Eat at  least 6 servings of fruit and vegetables daily   5. Mucopurulent chronic bronchitis (HCC)  - budesonide-formoterol (SYMBICORT) 80-4.5 MCG/ACT inhaler; Inhale 2 puffs into the lungs 2 (two) times daily.  Dispense: 3 Inhaler; Refill: 1  6. Needs flu shot  refused  7. Need for vaccination for pneumococcus  refused  8. Need for shingles vaccine  Refused    9. Colon cancer screening  - Ambulatory referral to General Surgery  10. Ovarian failure  - DG Bone Density; Future  11. Encounter for screening mammogram for breast cancer  - MM Digital Screening; Future  12. Gastroesophageal reflux disease without esophagitis  - omeprazole (PRILOSEC) 20 MG capsule; Take 1 capsule (20 mg total) by mouth daily.  Dispense: 90 capsule; Refill: 1  13. Long-term use of high-risk medication  - CBC with Differential/Platelet - COMPLETE METABOLIC PANEL WITH GFR  14. Need for hepatitis C screening test  - Hepatitis C antibody  15. Carotid artery plaque, unspecified laterality  Needs to start statin therapy  - atorvastatin (LIPITOR) 40 MG tablet; Take 1 tablet (40 mg total) by mouth daily.  Dispense: 90 tablet; Refill: 3 - aspirin 81 MG tablet; Take 1 tablet (81 mg total) by mouth daily.  Dispense: 30 tablet; Refill: 0

## 2016-01-09 LAB — COMPLETE METABOLIC PANEL WITH GFR
ALT: 13 U/L (ref 6–29)
AST: 19 U/L (ref 10–35)
Albumin: 3.5 g/dL — ABNORMAL LOW (ref 3.6–5.1)
Alkaline Phosphatase: 70 U/L (ref 33–130)
BUN: 17 mg/dL (ref 7–25)
CHLORIDE: 103 mmol/L (ref 98–110)
CO2: 27 mmol/L (ref 20–31)
CREATININE: 0.95 mg/dL (ref 0.50–0.99)
Calcium: 9.3 mg/dL (ref 8.6–10.4)
GFR, Est African American: 72 mL/min (ref >=60)
GFR, Est Non African American: 62 mL/min (ref >=60)
Glucose, Bld: 110 mg/dL — ABNORMAL HIGH (ref 65–99)
POTASSIUM: 3.7 mmol/L (ref 3.5–5.3)
Sodium: 144 mmol/L (ref 135–146)
Total Bilirubin: 0.4 mg/dL (ref 0.2–1.2)
Total Protein: 7.1 g/dL (ref 6.1–8.1)

## 2016-01-09 LAB — CBC WITH DIFFERENTIAL/PLATELET
BASOS PCT: 1 %
Basophils Absolute: 66 cells/uL (ref 0–200)
EOS PCT: 18 %
Eosinophils Absolute: 1188 cells/uL — ABNORMAL HIGH (ref 15–500)
HCT: 40.7 % (ref 35.0–45.0)
Hemoglobin: 12.7 g/dL (ref 11.7–15.5)
LYMPHS PCT: 19 %
Lymphs Abs: 1254 cells/uL (ref 850–3900)
MCH: 24.8 pg — ABNORMAL LOW (ref 27.0–33.0)
MCHC: 31.2 g/dL — ABNORMAL LOW (ref 32.0–36.0)
MCV: 79.5 fL — ABNORMAL LOW (ref 80.0–100.0)
MONO ABS: 330 {cells}/uL (ref 200–950)
MONOS PCT: 5 %
MPV: 10.4 fL (ref 7.5–12.5)
Neutro Abs: 3762 cells/uL (ref 1500–7800)
Neutrophils Relative %: 57 %
PLATELETS: 320 10*3/uL (ref 140–400)
RBC: 5.12 MIL/uL — AB (ref 3.80–5.10)
RDW: 14.3 % (ref 11.0–15.0)
WBC: 6.6 10*3/uL (ref 3.8–10.8)

## 2016-01-09 LAB — LIPID PANEL
CHOLESTEROL: 166 mg/dL (ref 125–200)
HDL: 31 mg/dL — AB (ref >=46)
LDL Cholesterol: 113 mg/dL (ref <130)
Total CHOL/HDL Ratio: 5.4 Ratio — ABNORMAL HIGH (ref <=5.0)
Triglycerides: 110 mg/dL (ref <150)
VLDL: 22 mg/dL (ref <30)

## 2016-01-09 LAB — HEPATITIS C ANTIBODY: HCV Ab: NEGATIVE

## 2016-01-14 ENCOUNTER — Other Ambulatory Visit: Payer: Self-pay

## 2016-01-14 ENCOUNTER — Other Ambulatory Visit: Payer: Self-pay | Admitting: Family Medicine

## 2016-01-14 DIAGNOSIS — R7309 Other abnormal glucose: Secondary | ICD-10-CM

## 2016-01-23 ENCOUNTER — Encounter: Payer: Self-pay | Admitting: *Deleted

## 2016-01-24 ENCOUNTER — Ambulatory Visit: Payer: Self-pay | Admitting: General Surgery

## 2016-02-05 ENCOUNTER — Encounter: Payer: Commercial Managed Care - HMO | Admitting: Family Medicine

## 2016-02-06 ENCOUNTER — Ambulatory Visit
Admission: RE | Admit: 2016-02-06 | Discharge: 2016-02-06 | Disposition: A | Discharge status: Home / Self Care | Payer: Commercial Managed Care - HMO | Source: Ambulatory Visit | Attending: Family Medicine | Admitting: Family Medicine

## 2016-02-06 ENCOUNTER — Encounter: Payer: Commercial Managed Care - HMO | Admitting: Family Medicine

## 2016-02-06 ENCOUNTER — Other Ambulatory Visit: Payer: Self-pay | Admitting: Family Medicine

## 2016-02-06 DIAGNOSIS — Z1231 Encounter for screening mammogram for malignant neoplasm of breast: Secondary | ICD-10-CM | POA: Diagnosis not present

## 2016-02-06 DIAGNOSIS — M85852 Other specified disorders of bone density and structure, left thigh: Secondary | ICD-10-CM | POA: Diagnosis not present

## 2016-02-06 DIAGNOSIS — E2839 Other primary ovarian failure: Secondary | ICD-10-CM | POA: Insufficient documentation

## 2016-02-07 ENCOUNTER — Encounter: Payer: Self-pay | Admitting: *Deleted

## 2016-02-10 ENCOUNTER — Telehealth: Payer: Self-pay | Admitting: Family Medicine

## 2016-02-11 NOTE — Progress Notes (Signed)
Patient was unaware but will call them to schedule appointment.

## 2016-02-12 NOTE — Telephone Encounter (Signed)
Not seen

## 2016-02-13 ENCOUNTER — Encounter: Payer: Self-pay | Admitting: Family Medicine

## 2016-02-13 ENCOUNTER — Telehealth: Payer: Self-pay

## 2016-02-13 DIAGNOSIS — M8580 Other specified disorders of bone density and structure, unspecified site: Secondary | ICD-10-CM | POA: Insufficient documentation

## 2016-02-13 NOTE — Telephone Encounter (Signed)
Patient called stating somebody called her from this office. I told her that I was not sure why they called but that [facility] wanted a copy of her sleep study and she said that she has not had a sleep study in years.   She then went on to ask about her dexa results. After she verified her date of birth, results were review. I then printed it out for her along with some information on Osteopenia and mailed it to her home address.  She stated that she is already taken the supplements but wanted to know if there was any medication that could help prevent any further damage.  Please advise.

## 2016-03-11 ENCOUNTER — Ambulatory Visit (INDEPENDENT_AMBULATORY_CARE_PROVIDER_SITE_OTHER): Payer: Commercial Managed Care - HMO | Admitting: Neurology

## 2016-03-11 ENCOUNTER — Encounter: Payer: Self-pay | Admitting: Neurology

## 2016-03-11 VITALS — BP 100/62 | HR 78 | Resp 22 | Ht 64.0 in | Wt 260.0 lb

## 2016-03-11 DIAGNOSIS — R400 Somnolence: Secondary | ICD-10-CM | POA: Diagnosis not present

## 2016-03-11 DIAGNOSIS — R351 Nocturia: Secondary | ICD-10-CM | POA: Diagnosis not present

## 2016-03-11 DIAGNOSIS — G4733 Obstructive sleep apnea (adult) (pediatric): Secondary | ICD-10-CM | POA: Diagnosis not present

## 2016-03-11 DIAGNOSIS — G2581 Restless legs syndrome: Secondary | ICD-10-CM | POA: Diagnosis not present

## 2016-03-11 DIAGNOSIS — R51 Headache: Secondary | ICD-10-CM

## 2016-03-11 DIAGNOSIS — R519 Headache, unspecified: Secondary | ICD-10-CM

## 2016-03-11 NOTE — Patient Instructions (Signed)

## 2016-03-11 NOTE — Progress Notes (Signed)
Subjective:    Patient ID: Alexandra Foster is a 65 y.o. female.  HPI      Star Age, MD, PhD [facility] [address] [place], [place] [address]  Dear Dr. Ancil Boozer,   I saw your patient, Alexandra Foster, upon your kind request in my neurologic clinic today for initial consultation of her sleep disorder, in particular, concern for underlying obstructive sleep apnea. The patient is unaccompanied today. As you know, [name] is a 65 year old right-handed woman with an underlying medical history of hypertension, chronic bronchitis, remote history of smoking, and morbid obesity, who was previously diagnosed with obstructive sleep apnea as understand. She had a sleep study over 5 years ago. Sleep study results are not available for my review. As I understand, she was diagnosed with obstructive sleep apnea but could not afford CPAP at the time. I reviewed your office note from 01/08/2016. She would be willing to through with a sleep study and consider CPAP. She reports loud snoring and witnessed apneas while asleep. She reports not waking up rested and daytime somnolence. Her Epworth sleepiness score is 13 out of 24 today, her fatigue score is 49 out of 63. In addition, she endorses restless leg symptoms including need to move her legs and difficulty finding a comfortable position at night. She also suffers from left hip pain and left ankle pain. She was told recently that she has osteoporosis. She has occasional morning headaches and reports nocturia 4-5 times on any given night. She does not drink much in the way of water. She likes to drink sweet tea and drinks about 3 bottles of Volo per day.   Her Past Medical History Is Significant For: Past Medical History:  Diagnosis Date  . Asthma   . COPD (chronic obstructive pulmonary disease) ([place])   . Hypertension     Her Past Surgical History Is Significant For: Past Surgical History:   Procedure Laterality Date  . NASAL SINUS SURGERY  2002    Her Family History Is Significant For: Family History  Problem Relation Age of Onset  . Congestive Heart Failure Mother   . Coronary artery disease Father   . Breast cancer Sister 89    Her Social History Is Significant For: Social History   Social History  . Marital status: Married    Spouse name: N/A  . Number of children: 1  . Years of education: 67   Social History Main Topics  . Smoking status: Former Smoker    Quit date: 01/05/2009  . Smokeless tobacco: Former Systems developer    Types: Snuff    Quit date: 04/2001  . Alcohol use No  . Drug use: No  . Sexual activity: No   Other Topics Concern  . None   Social History Narrative  . None    Her Allergies Are:  No Known Allergies:   Her Current Medications Are:  Outpatient Encounter Prescriptions as of 03/11/2016  Medication Sig  . albuterol (PROVENTIL HFA;VENTOLIN HFA) 108 (90 BASE) MCG/ACT inhaler Inhale into the lungs.  [name] aspirin 81 MG tablet Take 1 tablet (81 mg total) by mouth daily.  [name] atorvastatin (LIPITOR) 40 MG tablet Take 1 tablet (40 mg total) by mouth daily.  . budesonide-formoterol (SYMBICORT) 80-4.5 MCG/ACT inhaler Inhale 2 puffs into the lungs 2 (two) times daily.  [name] omeprazole (PRILOSEC) 20 MG capsule Take 1 capsule (20 mg total) by mouth daily.  [name] triamterene-hydrochlorothiazide (DYAZIDE) 37.5-25 MG capsule Take  1 each (1 capsule total) by mouth daily.   No facility-administered encounter medications on file as of 03/11/2016.   :  Review of Systems:  Out of a complete 14 point review of systems, all are reviewed and negative with the exception of these symptoms as listed below: Review of Systems  Neurological:       Falls asleep easily when sitting still, wakes up many times during the night, snoring, witnessed apnea, wakes up feeling tired, morning headaches, daytime fatigue.   Epworth Sleepiness Scale 0= would never doze 1= slight chance  of dozing 2= moderate chance of dozing 3= high chance of dozing  Sitting and reading:3 Watching TV:3 Sitting inactive in a public place (ex. Theater or meeting):2 As a passenger in a car for an hour without a break:1 Lying down to rest in the afternoon:1 Sitting and talking to someone:0 Sitting quietly after lunch (no alcohol):2 In a car, while stopped in traffic:1 Total:13   Objective:  Neurologic Exam  Physical Exam Physical Examination:   Vitals:   03/11/16 1421  BP: 100/62  Pulse: 78  Resp: (!) 22   General Examination: The patient is a very pleasant 65 y.o. female in no acute distress. She appears well-developed and well-nourished and well groomed.   HEENT: Normocephalic, atraumatic, pupils are equal, round and reactive to light and accommodation. Funduscopic exam is normal with sharp disc margins noted. Extraocular tracking is good without limitation to gaze excursion or nystagmus noted. Normal smooth pursuit is noted. Hearing is grossly intact. Tympanic membranes are clear bilaterally. Face is symmetric with normal facial animation and normal facial sensation. Speech is clear with no dysarthria noted. There is no hypophonia. There is no lip, neck/head, jaw or voice tremor. Neck is supple with full range of passive and active motion. There are no carotid bruits on auscultation. Oropharynx exam reveals: mild mouth dryness, marginal dental hygiene with several missing teeth and moderate airway crowding, due to tonsils of 3+ and larger uvula, somewhat wider tongue. Mallampati is class III. Tongue protrudes centrally and palate elevates symmetrically. Neck size is 16.25 inches. She has a Mild overbite. She appears to have nasal congestion.  Chest: Clear to auscultation without wheezing, rhonchi or crackles noted.  Heart: S1+S2+0, regular and normal without murmurs, rubs or gallops noted.   Abdomen: Soft, non-tender and non-distended with normal bowel sounds appreciated on  auscultation.  Extremities: There is trace edema around the ankles. Pedal pulses are intact.  Skin: Warm and dry without trophic changes noted. There are no varicose veins.  Musculoskeletal: exam reveals no obvious joint deformities, tenderness or joint swelling or erythema with the exception of mild swelling noted on the dorsum of the left hand, slightly tender, has a cystic feel to it.   Neurologically:  Mental status: The patient is awake, alert and oriented in all 4 spheres. Her immediate and remote memory, attention, language skills and fund of knowledge are appropriate. There is no evidence of aphasia, agnosia, apraxia or anomia. Speech is clear with normal prosody and enunciation. Thought process is linear. Mood is normal and affect is normal.  Cranial nerves II - XII are as described above under HEENT exam. In addition: shoulder shrug is normal with equal shoulder height noted. Motor exam: Normal bulk, strength and tone is noted. There is no drift, tremor or rebound. Romberg is negative. Reflexes are 2+ throughout. Fine motor skills and coordination: intact with normal finger taps, normal hand movements, normal rapid alternating patting, normal foot taps and  normal foot agility.  Cerebellar testing: No dysmetria or intention tremor on finger to nose testing. Heel to shin is limited bilaterally, most likely secondary to body habitus.   Sensory exam: intact to light touch, pinprick, vibration, temperature sense in the upper and lower extremities.  Gait, station and balance: She stands with difficulty. No veering to one side is noted. No leaning to one side is noted. Posture is age-appropriate and stance is narrow based. Gait shows normal stride length and normal pace, but she has a mild limp. She does report left hip pain. tandem walk is difficult for her.               Assessment and Plan:  In summary, Angala Hilgers is a very pleasant 65 y.o.-year old female with an underlying medical  history of hypertension, chronic bronchitis, remote history of smoking, and morbid obesity, whose history and physical exam are in keeping with obstructive sleep apnea (OSA). I had a long chat with the patient about my findings and the diagnosis of OSA, its prognosis and treatment options. We talked about medical treatments, surgical interventions and non-pharmacological approaches. I explained in particular the risks and ramifications of untreated moderate to severe OSA, especially with respect to developing cardiovascular disease down the Road, including congestive heart failure, difficult to treat hypertension, cardiac arrhythmias, or stroke. Even type 2 diabetes has, in part, been linked to untreated OSA. Symptoms of untreated OSA include daytime sleepiness, memory problems, mood irritability and mood disorder such as depression and anxiety, lack of energy, as well as recurrent headaches, especially morning headaches. We talked about trying to maintain a healthy lifestyle in general, as well as the importance of weight control. I encouraged the patient to eat healthy, exercise daily and keep well hydrated, to keep a scheduled bedtime and wake time routine, to not skip any meals and eat healthy snacks in between meals. I advised the patient not to drive when feeling sleepy.Furthermore, she is advised to drink more water and reduce her soda and tea intake.  I recommended the following at this time: sleep study with potential positive airway pressure titration. (We will score hypopneas at 4% and split the sleep study into diagnostic and treatment portion, if the estimated. 2 hour AHI is >15/h).   I explained the sleep test procedure to the patient and also outlined possible surgical and non-surgical treatment options of OSA, including the use of a custom-made dental device (which would require a referral to a specialist dentist or oral surgeon), upper airway surgical options, such as pillar implants,  radiofrequency surgery, tongue base surgery, and UPPP (which would involve a referral to an ENT surgeon). Rarely, jaw surgery such as mandibular advancement may be considered.  I also explained the CPAP treatment option to the patient, who indicated that she would be willing to try CPAP if the need arises. I explained the importance of being compliant with PAP treatment, not only for insurance purposes but primarily to improve Her symptoms, and for the patient's long term health benefit, including to reduce Her cardiovascular risks. I answered all her questions today and the patient was in agreement. I would like to see her back after the sleep study is completed and encouraged her to call with any interim questions, concerns, problems or updates.   Thank you very much for allowing me to participate in the care of this nice patient. If I can be of any further assistance to you please do not hesitate to call me  at [phone].  Sincerely,   Star Age, MD, PhD

## 2016-03-14 ENCOUNTER — Ambulatory Visit (INDEPENDENT_AMBULATORY_CARE_PROVIDER_SITE_OTHER): Payer: Commercial Managed Care - HMO | Admitting: Family Medicine

## 2016-03-14 ENCOUNTER — Encounter: Payer: Self-pay | Admitting: Family Medicine

## 2016-03-14 VITALS — BP 118/68 | HR 93 | Temp 98.7°F | Resp 16 | Ht 64.0 in | Wt 263.4 lb

## 2016-03-14 DIAGNOSIS — M85859 Other specified disorders of bone density and structure, unspecified thigh: Secondary | ICD-10-CM

## 2016-03-14 DIAGNOSIS — L301 Dyshidrosis [pompholyx]: Secondary | ICD-10-CM

## 2016-03-14 DIAGNOSIS — I10 Essential (primary) hypertension: Secondary | ICD-10-CM | POA: Diagnosis not present

## 2016-03-14 DIAGNOSIS — M67442 Ganglion, left hand: Secondary | ICD-10-CM | POA: Diagnosis not present

## 2016-03-14 DIAGNOSIS — N3941 Urge incontinence: Secondary | ICD-10-CM

## 2016-03-14 DIAGNOSIS — G4733 Obstructive sleep apnea (adult) (pediatric): Secondary | ICD-10-CM | POA: Diagnosis not present

## 2016-03-14 MED ORDER — AMMONIUM LACTATE 12 % EX CREA
TOPICAL_CREAM | CUTANEOUS | 0 refills | Status: DC | PRN
Start: 2016-03-14 — End: 2017-04-21

## 2016-03-14 NOTE — Progress Notes (Signed)
Name: Alexandra Foster   MRN: [id number]    DOB: 1951/03/14   Date:03/14/2016       Progress Note  Subjective  Chief Complaint  Chief Complaint  Patient presents with  . Foot Injury    cut on bottom of left foot pt has had it for almost 2 weeks due to dry skin  . Cyst    on top of left hand for 2 weeks tender to touch    HPI  Cyst hand: she states that two weeks ago she noticed a bump on her left dorsal hand. It is non-tender, it gets bigger and smaller.   Rash on foot: she has very dry skin on both of her feet, and dries and cracks. She used a cream in the past and it worked well.   OSA: she went to sleep lab and will have repeat sleep study. She still has morning headaches, leg edema, and fatigue.  Urge Incontinence: she has a long history of urge incontinence but much worse over the past couple of months, having to wear depends. No dysuria or hematuria  HTN: she is on diuretic, we will not change at this time. No chest pain or palpitation  Stress: sisters diagnosed with breast cancer and grand-daughter is senior in [employer], has scholarships for [employer] but is now pregnant  Patient Active Problem List   Diagnosis Date Noted  . Urge incontinence 03/14/2016  . Osteopenia 02/13/2016  . OSA (obstructive sleep apnea) 01/08/2016  . Allergic rhinitis 04/13/2015  . Arterial vascular disease 09/18/2014  . Carotid artery plaque 09/18/2014  . COPD (chronic obstructive pulmonary disease) ([place]) 09/18/2014  . Morbid obesity ([place]) 09/18/2014  . Esophagitis, reflux 09/18/2014  . Essential hypertension 09/21/2006    Past Surgical History:  Procedure Laterality Date  . NASAL SINUS SURGERY  2002    Family History  Problem Relation Age of Onset  . Congestive Heart Failure Mother   . Coronary artery disease Father   . Breast cancer Sister 34    Social History   Social History  . Marital status: Married    Spouse name: N/A  . Number of children: 1  . Years of  education: 40   Occupational History  . Not on file.   Social History Main Topics  . Smoking status: Former Smoker    Quit date: 01/05/2009  . Smokeless tobacco: Former Systems developer    Types: Snuff    Quit date: 04/2001  . Alcohol use No  . Drug use: No  . Sexual activity: No   Other Topics Concern  . Not on file   Social History Narrative  . No narrative on file     Current Outpatient Prescriptions:  .  albuterol (PROVENTIL HFA;VENTOLIN HFA) 108 (90 BASE) MCG/ACT inhaler, Inhale into the lungs., Disp: , Rfl:  .  aspirin 81 MG tablet, Take 1 tablet (81 mg total) by mouth daily., Disp: 30 tablet, Rfl: 0 .  atorvastatin (LIPITOR) 40 MG tablet, Take 1 tablet (40 mg total) by mouth daily., Disp: 90 tablet, Rfl: 3 .  budesonide-formoterol (SYMBICORT) 80-4.5 MCG/ACT inhaler, Inhale 2 puffs into the lungs 2 (two) times daily., Disp: 3 Inhaler, Rfl: 1 .  omeprazole (PRILOSEC) 20 MG capsule, Take 1 capsule (20 mg total) by mouth daily., Disp: 90 capsule, Rfl: 1 .  triamterene-hydrochlorothiazide (DYAZIDE) 37.5-25 MG capsule, Take 1 each (1 capsule total) by mouth daily., Disp: 90 capsule, Rfl: 1  No Known Allergies   ROS  Constitutional: Negative  for fever or significant  weight change.  Respiratory: positive  For intermittent  cough and intermittent  shortness of breath.   Cardiovascular: Negative for chest pain or palpitations.  Gastrointestinal: Negative for abdominal pain, no bowel changes.  Musculoskeletal: Negative for gait problem or joint swelling.  Skin: Negative for rash.  Neurological: Negative for dizziness or headache.  No other specific complaints in a complete review of systems (except as listed in HPI above).  Objective  Vitals:   03/14/16 1451  BP: 118/68  Pulse: 93  Resp: 16  Temp: 98.7 F (37.1 C)  TempSrc: Oral  SpO2: 94%  Weight: 263 lb 6 oz (119.5 kg)  Height: '5\' 4"'  (1.626 m)    Body mass index is 45.21 kg/m.  Physical Exam  Constitutional: Patient  appears well-developed and well-nourished. Obese  No distress.  HEENT: head atraumatic, normocephalic, pupils equal and reactive to light, neck supple, throat within normal limits Cardiovascular: Normal rate, regular rhythm and normal heart sounds.  No murmur heard. Trace BLE edema. Pulmonary/Chest: Effort normal and breath sounds normal. No respiratory distress. Abdominal: Soft.  There is no tenderness. Psychiatric: Patient has a normal mood and affect. behavior is normal. Judgment and thought content normal.  Recent Results (from the past 2160 hour(s))  CBC with Differential/Platelet     Status: Abnormal   Collection Time: 01/08/16  3:43 PM  Result Value Ref Range   WBC 6.6 3.8 - 10.8 K/uL   RBC 5.12 (H) 3.80 - 5.10 MIL/uL   Hemoglobin 12.7 11.7 - 15.5 g/dL   HCT 40.7 35.0 - 45.0 %   MCV 79.5 (L) 80.0 - 100.0 fL   MCH 24.8 (L) 27.0 - 33.0 pg   MCHC 31.2 (L) 32.0 - 36.0 g/dL   RDW 14.3 11.0 - 15.0 %   Platelets 320 140 - 400 K/uL   MPV 10.4 7.5 - 12.5 fL   Neutro Abs 3,762 1,500 - 7,800 cells/uL   Lymphs Abs 1,254 850 - 3,900 cells/uL   Monocytes Absolute 330 200 - 950 cells/uL   Eosinophils Absolute 1,188 (H) 15 - 500 cells/uL   Basophils Absolute 66 0 - 200 cells/uL   Neutrophils Relative % 57 %   Lymphocytes Relative 19 %   Monocytes Relative 5 %   Eosinophils Relative 18 %   Basophils Relative 1 %   Smear Review Criteria for review not met   COMPLETE METABOLIC PANEL WITH GFR     Status: Abnormal   Collection Time: 01/08/16  3:43 PM  Result Value Ref Range   Sodium 144 135 - 146 mmol/L   Potassium 3.7 3.5 - 5.3 mmol/L   Chloride 103 98 - 110 mmol/L   CO2 27 20 - 31 mmol/L   Glucose, Bld 110 (H) 65 - 99 mg/dL   BUN 17 7 - 25 mg/dL   Creat 0.95 0.50 - 0.99 mg/dL    Comment:   For patients > or = 67 years of age: The upper reference limit for Creatinine is approximately 13% higher for people identified as African-American.      Total Bilirubin 0.4 0.2 - 1.2 mg/dL    Alkaline Phosphatase 70 33 - 130 U/L   AST 19 10 - 35 U/L   ALT 13 6 - 29 U/L   Total Protein 7.1 6.1 - 8.1 g/dL   Albumin 3.5 (L) 3.6 - 5.1 g/dL   Calcium 9.3 8.6 - 10.4 mg/dL   GFR, Est African American 72 >=60 mL/min  GFR, Est Non African American 62 >=60 mL/min  Hepatitis C antibody     Status: None   Collection Time: 01/08/16  3:43 PM  Result Value Ref Range   HCV Ab NEGATIVE NEGATIVE  Lipid panel     Status: Abnormal   Collection Time: 01/08/16  3:43 PM  Result Value Ref Range   Cholesterol 166 125 - 200 mg/dL   Triglycerides 110 <150 mg/dL   HDL 31 (L) >=46 mg/dL   Total CHOL/HDL Ratio 5.4 (H) <=5.0 Ratio   VLDL 22 <30 mg/dL   LDL Cholesterol 113 <130 mg/dL    Comment:   Total Cholesterol/HDL Ratio:CHD Risk                        Coronary Heart Disease Risk Table                                        Men       Women          1/2 Average Risk              3.4        3.3              Average Risk              5.0        4.4           2X Average Risk              9.6        7.1           3X Average Risk             23.4       11.0 Use the calculated Patient Ratio above and the CHD Risk table  to determine the patient's CHD Risk.       PHQ2/9: Depression screen [facility] 2/9 03/14/2016 01/08/2016 04/13/2015  Decreased Interest 0 0 0  Down, Depressed, Hopeless 0 0 0  PHQ - 2 Score 0 0 0     Fall Risk: Fall Risk  03/14/2016 01/08/2016 04/13/2015  Falls in the past year? No No No      Assessment & Plan  1. Essential hypertension  She is taking diuretic but has urge incontinence, discussed changing medication, but she wants to hold off for now, we will wait for her to start CPAP   2. OSA (obstructive sleep apnea)  She will have repeat study soon  3. Urge incontinence  - CULTURE, URINE COMPREHENSIVE  4. Osteopenia of neck of femur  Continue calcium and vitamin D  5. Ganglion cyst of flexor tendon sheath of finger of left hand  reassurance  6. Dyshidrotic  foot dermatitis  - ammonium lactate (LAC-HYDRIN) 12 % cream; Apply topically as needed for dry skin.  Dispense: 385 g; Refill: 0

## 2016-03-16 LAB — CULTURE, URINE COMPREHENSIVE

## 2016-03-17 ENCOUNTER — Other Ambulatory Visit: Payer: Self-pay | Admitting: Family Medicine

## 2016-03-18 ENCOUNTER — Telehealth: Payer: Self-pay | Admitting: Family Medicine

## 2016-03-18 NOTE — Telephone Encounter (Signed)
Pt is asking for her results of her labs. Can leave a message if no answer.

## 2016-03-19 NOTE — Telephone Encounter (Signed)
Attempted to reach pt but she has a voicemail that has not been set up yet

## 2016-04-02 ENCOUNTER — Telehealth: Payer: Self-pay | Admitting: Family Medicine

## 2016-04-11 NOTE — Telephone Encounter (Signed)
ERRENOUS °

## 2016-04-20 ENCOUNTER — Ambulatory Visit (INDEPENDENT_AMBULATORY_CARE_PROVIDER_SITE_OTHER): Payer: Commercial Managed Care - HMO | Admitting: Neurology

## 2016-04-20 DIAGNOSIS — G4733 Obstructive sleep apnea (adult) (pediatric): Secondary | ICD-10-CM | POA: Diagnosis not present

## 2016-04-20 DIAGNOSIS — G4720 Circadian rhythm sleep disorder, unspecified type: Secondary | ICD-10-CM

## 2016-04-28 ENCOUNTER — Telehealth: Payer: Self-pay

## 2016-04-28 NOTE — Telephone Encounter (Signed)
I spoke to patient and she is aware of results and recommendations. She is willing to start treatment. I will send referral to [facility], she would like to use [facility]. PCP will get a copy of the report. Patient will receive a letter reminding her to make f/u appt and stress the importance of compliance.

## 2016-04-28 NOTE — Progress Notes (Signed)
Alexandra Foster:  Patient referred by Dr. Ancil Boozer, seen by me on 03/11/16, split study on 04/20/16. Please call and notify patient that the recent sleep study confirmed the diagnosis of severe OSA. She did well with CPAP during the study with significant improvement of the respiratory events. Therefore, I would like start the patient on CPAP therapy at home by prescribing a machine for home use. I placed the order in the chart. The patient will need a follow up appointment with me in 8 to 10 weeks post set up that has to be scheduled; please go ahead and schedule while you have the patient on the phone and make sure patient understands the importance of keeping this window for the FU appointment, as it is often an insurance requirement and failing to adhere to this may result in losing coverage for sleep apnea treatment.  Please re-enforce the importance of compliance with treatment and the need for [place] to monitor compliance data - again an insurance requirement and good feedback for the patient as far as how they are doing.  Also remind patient, that any upcoming CPAP machine or mask issues, should be first addressed with the DME company. Please ask if patient has a preference regarding DME company.  Please arrange for CPAP set up at home through a DME company of patient's choice - once you have spoken to the patient - and faxed/routed report to PCP and referring MD (if other than PCP), you can close this encounter, thanks,   Star Age, MD, PhD [facility] ([place])

## 2016-04-28 NOTE — Procedures (Signed)
PATIENT'S NAME:  Alexandra Foster, Alexandra Foster DOB:      04/20/2018      MR#:    [id number]     DATE OF RECORDING: 04/20/2016 REFERRING M.D.:  Ashok Norris, MD Study Performed:  Split-Night Titration Study HISTORY: 66 year old woman with a history of hypertension, chronic bronchitis, remote history of smoking, and morbid obesity, who was previously diagnosed with obstructive sleep over 5 years ago. Sleep study, but could not afford CPAP at the time. The patient endorsed the Epworth Sleepiness Scale at 13/24 points The patient's weight 260 pounds with a height of 64 (inches), resulting in a BMI of 44.4 kg/m2. The patient's neck circumference measured 16.2 inches.  CURRENT MEDICATIONS: Albuterol, Aspirin, Atorvastatin, Budesonide-formoterol, Omeprazole and Triamterene-Hydrochlorothiazide    PROCEDURE:  This is a multichannel digital polysomnogram utilizing the Somnostar 11.2 system.  Electrodes and sensors were applied and monitored per AASM Specifications.   EEG, EOG, Chin and Limb EMG, were sampled at 200 Hz.  ECG, Snore and Nasal Pressure, Thermal Airflow, Respiratory Effort, CPAP Flow and Pressure, Oximetry was sampled at 50 Hz. Digital video and audio were recorded.      BASELINE STUDY WITHOUT CPAP RESULTS:  Lights Out was at 21:16 and Lights On at 05:06 for the night.  Total recording time (TRT) was 233, with a total sleep time (TST) of 202 minutes.   The patient's sleep latency was 13.5 minutes.  REM latency was 79 minutes.  The sleep efficiency was 86.7 %.    SLEEP ARCHITECTURE: WASO (Wake after sleep onset) was 8.5 minutes, Stage N1 was 12 minutes, Stage N2 was 148.5 minutes, Stage N3 was 0 minutes and Stage R (REM sleep) was 41.5 minutes.  The percentages were Stage N1 5.9%, Stage N2 73.5%, Stage N3 was absent, and Stage R (REM sleep) 20.5%.   The arousals were noted as: 25 were spontaneous, 0 were associated with PLMs, 150 were associated with respiratory events.   Audio and video analysis did not  show any abnormal or unusual movements, behaviors, phonations or vocalizations.  The patient took no bathroom breaks. Moderate to loud snoring was noted. The EKG was in keeping with normal sinus rhythm (NSR)  RESPIRATORY ANALYSIS:  There were a total of 150 respiratory events:  52 obstructive apneas, 0 central apneas and 0 mixed apneas with a total of 52 apneas and an apnea index (AI) of 15.4. There were 98 hypopneas with a hypopnea index of 29.1. The patient also had 0 respiratory event related arousals (RERAs).  Snoring was noted.     The total APNEA/HYPOPNEA INDEX (AHI) was 44.6 /hour and the total RESPIRATORY DISTURBANCE INDEX was 44.6 /hour.  57 events occurred in REM sleep and 128 events in NREM. The REM AHI was 82.4, /hour versus a non-REM AHI of 34.8 /hour. The patient spent 91.5 minutes sleep time in the supine position 309 minutes in non-supine. The supine AHI was 52.2 /hour versus a non-supine AHI of 38.6 /hour.  OXYGEN SATURATION & C02:  The wake baseline 02 saturation was 93%, with the lowest being 69%. Time spent below 89% saturation equaled 107 minutes.  PERIODIC LIMB MOVEMENTS:    The patient had a total of 0 Periodic Limb Movements.  The Periodic Limb Movement (PLM) index was 0 /hour and the PLM Arousal index was 0 /hour.  TITRATION STUDY WITH CPAP RESULTS:   CPAP was initiated at 5 cmH20 with heated humidity per AASM split night standards and pressure was advanced to 8/8 cmH20 because of hypopneas,  apneas and desaturations. At a PAP pressure of 8 cmH20, there was a reduction of the AHI to 0/hours (O2 nadir of 91%) with non-supine REM sleep achieved on a pressure of 7 cm.   Total recording time (TRT) was 226 minutes, with a total sleep time (TST) of 198 minutes. The patient's sleep latency was 9 minutes. REM latency was 11 minutes.  The sleep efficiency was 87.6 %.    SLEEP ARCHITECTURE: Wake after sleep was 22 minutes, Stage N1 5.5 minutes, Stage N2 134 minutes, Stage N3 0 minutes  and Stage R (REM sleep) 58.5 minutes. The percentages were: Stage N1 2.8%, Stage N2 67.7%, Stage N3 was absent and Stage R (REM sleep) 29.5%.   The arousals were noted as: 13 were spontaneous, 0 were associated with PLMs, 11 were associated with respiratory events.  RESPIRATORY ANALYSIS:  There were a total of 11 respiratory events: 0 obstructive apneas, 0 central apneas and 0 mixed apneas with a total of 0 apneas and an apnea index (AI) of 0. There were 11 hypopneas with a hypopnea index of 3.3 /hour. The patient also had 0 respiratory event related arousals (RERAs).      The total APNEA/HYPOPNEA INDEX  (AHI) was 3.3 /hour and the total RESPIRATORY DISTURBANCE INDEX was 3.3 /hour.  10 events occurred in REM sleep and 1 events in NREM. The REM AHI was 10.3 /hour versus a non-REM AHI of .4 /hour. REM sleep was achieved on a pressure of  cm/h2o (AHI was  .) The patient spent 2% of total sleep time in the supine position. The supine AHI was 0.0 /hour, versus a non-supine AHI of 3.4/hour.  OXYGEN SATURATION & C02:  The wake baseline 02 saturation was 92%, with the lowest being 84%. Time spent below 89% saturation equaled 27 minutes.  PERIODIC LIMB MOVEMENTS:    The patient had a total of 0 Periodic Limb Movements. The Periodic Limb Movement (PLM) index was 0 /hour and the PLM Arousal index was 0 /hour.   Post-study, the patient indicated that sleep was better than usual.  POLYSOMNOGRAPHY IMPRESSION :   1. Obstructive Sleep Apnea (OSA)  2. Dysfunctions associated with sleep stages or arousals from sleep  RECOMMENDATIONS:  1. This patient has severe obstructive sleep apnea and responded well on CPAP therapy. I will, therefore, start the patient on home CPAP treatment at a pressure of 8 cm via medium FFM, with heated humidity. The patient should be reminded to be fully compliant with PAP therapy to improve sleep related symptoms and decrease long term cardiovascular risks. Please note that untreated  obstructive sleep apnea carries additional perioperative morbidity. Patients with significant obstructive sleep apnea should receive perioperative PAP therapy and the surgeons and particularly the anesthesiologist should be informed of the diagnosis and the severity of the sleep disordered breathing. 2. The patient should be cautioned not to drive, work at heights, or operate dangerous or heavy equipment when tired or sleepy. Review and reiteration of good sleep hygiene measures should be pursued with any patient. 3. This study shows sleep fragmentation and abnormal sleep stage percentages; these are nonspecific findings and per se do not signify an intrinsic sleep disorder or a cause for the patient's sleep-related symptoms. Causes include (but are not limited to) the first night effect of the sleep study, circadian rhythm disturbances, medication effect or an underlying mood disorder or medical problem.  4. The patient will be seen in follow-up by Dr. Rexene Alberts at [facility] for discussion of the test  results and further management strategies. The referring provider will be notified of the test results.  I certify that I have reviewed the entire raw data recording prior to the issuance of this report in accordance with the Standards of Accreditation of the American Academy of Sleep Medicine (AASM)   Star Age, MD, PhD Diplomat, American Board of Psychiatry and Neurology (Neurology and Sleep Medicine)

## 2016-04-28 NOTE — Addendum Note (Signed)
Addended by: Star Age on: 04/28/2016 08:06 AM   Modules accepted: Orders

## 2016-04-28 NOTE — Telephone Encounter (Signed)
-----   Message from Star Age, MD sent at 04/28/2016  8:06 AM EST ----- Beverlee Nims:  Patient referred by Dr. Ancil Boozer, seen by me on 03/11/16, split study on 04/20/16. Please call and notify patient that the recent sleep study confirmed the diagnosis of severe OSA. She did well with CPAP during the study with significant improvement of the respiratory events. Therefore, I would like start the patient on CPAP therapy at home by prescribing a machine for home use. I placed the order in the chart. The patient will need a follow up appointment with me in 8 to 10 weeks post set up that has to be scheduled; please go ahead and schedule while you have the patient on the phone and make sure patient understands the importance of keeping this window for the FU appointment, as it is often an insurance requirement and failing to adhere to this may result in losing coverage for sleep apnea treatment.  Please re-enforce the importance of compliance with treatment and the need for [place] to monitor compliance data - again an insurance requirement and good feedback for the patient as far as how they are doing.  Also remind patient, that any upcoming CPAP machine or mask issues, should be first addressed with the DME company. Please ask if patient has a preference regarding DME company.  Please arrange for CPAP set up at home through a DME company of patient's choice - once you have spoken to the patient - and faxed/routed report to PCP and referring MD (if other than PCP), you can close this encounter, thanks,   Star Age, MD, PhD [facility] ([place])

## 2016-05-06 DIAGNOSIS — G4733 Obstructive sleep apnea (adult) (pediatric): Secondary | ICD-10-CM | POA: Diagnosis not present

## 2016-05-16 DIAGNOSIS — J324 Chronic pansinusitis: Secondary | ICD-10-CM | POA: Diagnosis not present

## 2016-05-16 DIAGNOSIS — J331 Polypoid sinus degeneration: Secondary | ICD-10-CM | POA: Diagnosis not present

## 2016-06-06 DIAGNOSIS — G4733 Obstructive sleep apnea (adult) (pediatric): Secondary | ICD-10-CM | POA: Diagnosis not present

## 2016-06-14 ENCOUNTER — Other Ambulatory Visit: Payer: Self-pay | Admitting: Family Medicine

## 2016-06-14 DIAGNOSIS — I10 Essential (primary) hypertension: Secondary | ICD-10-CM

## 2016-06-27 DIAGNOSIS — J340 Abscess, furuncle and carbuncle of nose: Secondary | ICD-10-CM | POA: Diagnosis not present

## 2016-06-27 DIAGNOSIS — J331 Polypoid sinus degeneration: Secondary | ICD-10-CM | POA: Diagnosis not present

## 2016-07-02 DIAGNOSIS — B351 Tinea unguium: Secondary | ICD-10-CM | POA: Diagnosis not present

## 2016-07-02 DIAGNOSIS — L820 Inflamed seborrheic keratosis: Secondary | ICD-10-CM | POA: Diagnosis not present

## 2016-07-02 DIAGNOSIS — B353 Tinea pedis: Secondary | ICD-10-CM | POA: Diagnosis not present

## 2016-07-02 DIAGNOSIS — L821 Other seborrheic keratosis: Secondary | ICD-10-CM | POA: Diagnosis not present

## 2016-07-04 DIAGNOSIS — G4733 Obstructive sleep apnea (adult) (pediatric): Secondary | ICD-10-CM | POA: Diagnosis not present

## 2016-07-21 ENCOUNTER — Encounter: Payer: Self-pay | Admitting: Neurology

## 2016-07-21 ENCOUNTER — Encounter (INDEPENDENT_AMBULATORY_CARE_PROVIDER_SITE_OTHER): Payer: Self-pay

## 2016-07-21 ENCOUNTER — Ambulatory Visit (INDEPENDENT_AMBULATORY_CARE_PROVIDER_SITE_OTHER): Payer: Medicare HMO | Admitting: Neurology

## 2016-07-21 VITALS — BP 140/84 | HR 75 | Resp 20 | Ht 64.0 in | Wt 266.0 lb

## 2016-07-21 DIAGNOSIS — G4733 Obstructive sleep apnea (adult) (pediatric): Secondary | ICD-10-CM

## 2016-07-21 DIAGNOSIS — Z9989 Dependence on other enabling machines and devices: Secondary | ICD-10-CM | POA: Diagnosis not present

## 2016-07-21 NOTE — Progress Notes (Signed)
Subjective:    Patient ID: Alexandra Foster is a 66 y.o. female.  HPI     Interim history:   Alexandra Foster is a 66 year old right-handed woman with an underlying medical history of hypertension, chronic bronchitis, remote history of smoking, and morbid obesity, who presents for follow-up consultation of her obstructive sleep apnea, after her recent split-night sleep study. The patient is unaccompanied today. I first met her on 03/11/2016 at the request of her primary care physician, at which time she reported a prior diagnosis of OSA. She was invited for sleep study. She had a split-night sleep study on 04/20/2016. I went over her test results with her in detail today. Baseline sleep efficiency was 86.7%, sleep latency 13.5 minutes, wake after sleep onset 8.5 minutes. She had an increased percentage of stage II sleep, absence of slow-wave sleep and REM sleep was 20.5% with a REM latency of 79 minutes which was normal. Total AHI was 44.6 per hour, REM AHI 82.4 per hour. Average oxygen saturation was 93%, nadir was 69%. She had no significant PLMS. CPAP was initiated at 5 cm and titrated to 8 cm. AHI was 0 per hour on the final pressure, O2 nadir of 91% but only nonsupine REM sleep achieved on a pressure of 7 cm. Average oxygen saturation post CPAP was 92%, nadir was 84%. She had no significant PLMS post CPAP.  Today, 07/21/2016 (all dictated new, as well as above notes, some dictation done in note pad or Word, outside of chart, may appear as copied):  I reviewed her CPAP compliance data from 06/17/2016 through 07/16/2016, which is a total of 30 days, during which time she used her CPAP 25 days with percent used days greater than 4 hours at 80%, indicating very good compliance with an average usage of 8 hours and 28 minutes, residual AHI 3.7 per hour, leak on the low side with the 95th percentile at 6.2 L/m on a pressure of 9 cm with EPR of 3. She reports doing very well with CPAP. She likes the machine,  has no recent trouble using it although she had difficulty in the beginning and had to go back to her DME company for more than one time for reeducation and help with the headgear. Finally she got the hang of it and has been doing rather well. She missed a couple of days because of interim stressors. One stressor has been her 56 year old granddaughter who had problems with her pregnancy, and she had to go to ER with her and stay overnight. She reports feeling better with her sleep including less daytime somnolence, better sleep quality and better sleep consolidation. She is rather pleased with how she is doing.    The patient's allergies, current medications, family history, past medical history, past social history, past surgical history and problem list were reviewed and updated as appropriate.   Previously (copied from previous notes for reference):   03/11/2016: She was previously diagnosed with obstructive sleep apnea as understand. She had a sleep study over 5 years ago. Sleep study results are not available for my review. As I understand, she was diagnosed with obstructive sleep apnea but could not afford CPAP at the time. I reviewed your office note from 01/08/2016. She would be willing to through with a sleep study and consider CPAP. She reports loud snoring and witnessed apneas while asleep. She reports not waking up rested and daytime somnolence. Her Epworth sleepiness score is 13 out of 24 today, her fatigue score is  49 out of 63. In addition, she endorses restless leg symptoms including need to move her legs and difficulty finding a comfortable position at night. She also suffers from left hip pain and left ankle pain. She was told recently that she has osteoporosis. She has occasional morning headaches and reports nocturia 4-5 times on any given night. She does not drink much in the way of water. She likes to drink sweet tea and drinks about 3 bottles of Mountain Dew per day.   Her Past  Medical History Is Significant For: Past Medical History:  Diagnosis Date  . Asthma   . COPD (chronic obstructive pulmonary disease) (HCC)   . Hypertension     Her Past Surgical History Is Significant For: Past Surgical History:  Procedure Laterality Date  . NASAL SINUS SURGERY  2002    Her Family History Is Significant For: Family History  Problem Relation Age of Onset  . Congestive Heart Failure Mother   . Coronary artery disease Father   . Breast cancer Sister 86    Her Social History Is Significant For: Social History   Social History  . Marital status: Married    Spouse name: N/A  . Number of children: 1  . Years of education: 34   Social History Main Topics  . Smoking status: Former Smoker    Quit date: 01/05/2009  . Smokeless tobacco: Former Neurosurgeon    Types: Snuff    Quit date: 04/2001  . Alcohol use No  . Drug use: No  . Sexual activity: No   Other Topics Concern  . None   Social History Narrative  . None    Her Allergies Are:  No Known Allergies:   Her Current Medications Are:  Outpatient Encounter Prescriptions as of 07/21/2016  Medication Sig  . albuterol (PROVENTIL HFA;VENTOLIN HFA) 108 (90 BASE) MCG/ACT inhaler Inhale into the lungs.  [name] ammonium lactate (LAC-HYDRIN) 12 % cream Apply topically as needed for dry skin.  [name] aspirin 81 MG tablet Take 1 tablet (81 mg total) by mouth daily.  [name] atorvastatin (LIPITOR) 40 MG tablet Take 1 tablet (40 mg total) by mouth daily.  . budesonide-formoterol (SYMBICORT) 80-4.5 MCG/ACT inhaler Inhale 2 puffs into the lungs 2 (two) times daily.  [name] omeprazole (PRILOSEC) 20 MG capsule Take 1 capsule (20 mg total) by mouth daily.  [name] triamterene-hydrochlorothiazide (DYAZIDE) 37.5-25 MG capsule TAKE 1 CAPSULE EVERY DAY   No facility-administered encounter medications on file as of 07/21/2016.   :  Review of Systems:  Out of a complete 14 point review of systems, all are reviewed and negative with the exception of these  symptoms as listed below: Review of Systems  Neurological:       Pt presents today to discuss her cpap. Pt says that she is feeling better on her cpap and it has helped the soreness in her legs.    Objective:  Neurologic Exam  Physical Exam Physical Examination:   Vitals:   07/21/16 1139  BP: 140/84  Pulse: 75  Resp: 20   General Examination: The patient is a very pleasant 66 y.o. female in no acute distress. She appears well-developed and well-nourished and well groomed.   HEENT: Normocephalic, atraumatic, pupils are equal, round and reactive to light and accommodation. Extraocular tracking is good without limitation to gaze excursion or nystagmus noted. Normal smooth pursuit is noted. Hearing is grossly intact. Face is symmetric with normal facial animation and normal facial sensation. Speech is clear with no dysarthria  noted. There is no hypophonia. There is no lip, neck/head, jaw or voice tremor. Neck is supple with full range of passive and active motion. There are no carotid bruits on auscultation. Oropharynx exam reveals: mild mouth dryness, adequate dental hygiene and moderate airway crowding. Mallampati is class III. Tongue protrudes centrally and palate elevates symmetrically. Tonsils are about 3+ in size.   Chest: Clear to auscultation without wheezing, rhonchi or crackles noted.  Heart: S1+S2+0, regular and normal without murmurs, rubs or gallops noted.   Abdomen: Soft, non-tender and non-distended with normal bowel sounds appreciated on auscultation.  Extremities: There is no pitting edema in the distal lower extremities bilaterally. Pedal pulses are intact.  Skin: Warm and dry without trophic changes noted.  Musculoskeletal: exam reveals no obvious joint deformities, tenderness or joint swelling or erythema.   Neurologically:  Mental status: The patient is awake, alert and oriented in all 4 spheres. Her immediate and remote memory, attention, language skills and fund  of knowledge are appropriate. There is no evidence of aphasia, agnosia, apraxia or anomia. Speech is clear with normal prosody and enunciation. Thought process is linear. Mood is normal and affect is normal.  Cranial nerves II - XII are as described above under HEENT exam. In addition: shoulder shrug is normal with equal shoulder height noted. Motor exam: Normal bulk, strength and tone is noted. There is no drift, tremor or rebound. Romberg is negative. Reflexes are 1+ throughout. Fine motor skills and coordination: intact with normal finger taps, normal hand movements, normal rapid alternating patting, normal foot taps and normal foot agility.  Cerebellar testing: No dysmetria or intention tremor on finger to nose testing. Heel to shin is unremarkable bilaterally. There is no truncal or gait ataxia.  Sensory exam: intact to light touch in the upper and lower extremities.  Gait, station and balance: She stands easily. No veering to one side is noted. No leaning to one side is noted. Posture is age-appropriate and stance is narrow based. Gait shows normal stride length and normal pace. No problems turning are noted.   Assessment and Plan:  In summary, Alexandra Foster is a very pleasant 66 y.o.-year old female with an underlying medical history of hypertension, chronic bronchitis, remote history of smoking, and morbid obesity, who returns for follow-up consultation of her severe obstructive sleep apnea, after her recent split-night sleep study in January 2018. This indicated severe obstructive sleep apnea with a total AHI of 44.6 per hour, O2 nadir at 69%. She did well with CPAP therapy at the time and has been on CPAP treatment at home. She has fairly good CPAP compliance but has skipped a few days in the past month. She indicates improvement of her sleep quality, daytime somnolence and sleep consolidation. Physical exam is stable. We talked about her test results in detail today and also her recent  compliance data. She is commended for her treatment adherence and encouraged to be fully compliant with treatment and not skip any nights. She had some stressors recently including having to go to ER with her GD, who is pregnant and had some complications. Physical exam is stable. I suggested a 6 month follow-up, she can see one of our nurse practitioners at the time. I answered all her questions today and she was in agreement. I spent 25 minutes in total face-to-face time with the patient, more than 50% of which was spent in counseling and coordination of care, reviewing test results, reviewing medication and discussing or reviewing the diagnosis  of OSA, its prognosis and treatment options. Pertinent laboratory and imaging test results that were available during this visit with the patient were reviewed by me and considered in my medical decision making (see chart for details).

## 2016-07-21 NOTE — Patient Instructions (Addendum)
Please continue using your CPAP regularly. While your insurance requires that you use CPAP at least 4 hours each night on 70% of the nights, I recommend, that you not skip any nights and use it throughout the night if you can. Getting used to CPAP and staying with the treatment long term does take time and patience and discipline. Untreated obstructive sleep apnea when it is moderate to severe can have an adverse impact on cardiovascular health and raise her risk for heart disease, arrhythmias, hypertension, congestive heart failure, stroke and diabetes. Untreated obstructive sleep apnea causes sleep disruption, nonrestorative sleep, and sleep deprivation. This can have an impact on your day to day functioning and cause daytime sleepiness and impairment of cognitive function, memory loss, mood disturbance, and problems focussing. Using CPAP regularly can improve these symptoms.  You don't have to bring your machine next time. We can see you in 6 months, you can see one of our nurse practitioners as you are stable. I will see you after that.

## 2016-08-04 DIAGNOSIS — G4733 Obstructive sleep apnea (adult) (pediatric): Secondary | ICD-10-CM | POA: Diagnosis not present

## 2016-08-12 DIAGNOSIS — G4733 Obstructive sleep apnea (adult) (pediatric): Secondary | ICD-10-CM | POA: Diagnosis not present

## 2016-08-13 DIAGNOSIS — L820 Inflamed seborrheic keratosis: Secondary | ICD-10-CM | POA: Diagnosis not present

## 2016-08-13 DIAGNOSIS — L821 Other seborrheic keratosis: Secondary | ICD-10-CM | POA: Diagnosis not present

## 2016-08-18 ENCOUNTER — Encounter: Payer: Self-pay | Admitting: General Surgery

## 2016-08-21 ENCOUNTER — Other Ambulatory Visit: Payer: Self-pay | Admitting: Family Medicine

## 2016-08-21 DIAGNOSIS — K219 Gastro-esophageal reflux disease without esophagitis: Secondary | ICD-10-CM

## 2016-08-21 DIAGNOSIS — I10 Essential (primary) hypertension: Secondary | ICD-10-CM

## 2016-08-26 ENCOUNTER — Encounter: Payer: Self-pay | Admitting: General Surgery

## 2016-08-26 ENCOUNTER — Ambulatory Visit (INDEPENDENT_AMBULATORY_CARE_PROVIDER_SITE_OTHER): Payer: Medicare HMO | Admitting: General Surgery

## 2016-08-26 VITALS — BP 134/84 | HR 76 | Resp 16 | Ht 65.5 in | Wt 260.0 lb

## 2016-08-26 DIAGNOSIS — Z1211 Encounter for screening for malignant neoplasm of colon: Secondary | ICD-10-CM

## 2016-08-26 MED ORDER — MAGNESIUM CITRATE PO SOLN: 1.0000 | Freq: Once | ORAL | 0 refills | Status: AC

## 2016-08-26 MED ORDER — POLYETHYLENE GLYCOL 3350 17 GM/SCOOP PO POWD: 1.0000 | Freq: Once | ORAL | 0 refills | Status: AC

## 2016-08-26 NOTE — Patient Instructions (Addendum)
Colonoscopy, Adult A colonoscopy is an exam to look at the entire large intestine. During the exam, a lubricated, bendable tube is inserted into the anus and then passed into the rectum, colon, and other parts of the large intestine. A colonoscopy is often done as a part of normal colorectal screening or in response to certain symptoms, such as anemia, persistent diarrhea, abdominal pain, and blood in the stool. The exam can help screen for and diagnose medical problems, including:  Tumors.  Polyps.  Inflammation.  Areas of bleeding. Tell a health care provider about:  Any allergies you have.  All medicines you are taking, including vitamins, herbs, eye drops, creams, and over-the-counter medicines.  Any problems you or family members have had with anesthetic medicines.  Any blood disorders you have.  Any surgeries you have had.  Any medical conditions you have.  Any problems you have had passing stool. What are the risks? Generally, this is a safe procedure. However, problems may occur, including:  Bleeding.  A tear in the intestine.  A reaction to medicines given during the exam.  Infection (rare). What happens before the procedure? Eating and drinking restrictions  Follow instructions from your health care provider about eating and drinking, which may include:  A few days before the procedure - follow a low-fiber diet. Avoid nuts, seeds, dried fruit, raw fruits, and vegetables.  1-3 days before the procedure - follow a clear liquid diet. Drink only clear liquids, such as clear broth or bouillon, black coffee or tea, clear juice, clear soft drinks or sports drinks, gelatin dessert, and popsicles. Avoid any liquids that contain red or purple dye.  On the day of the procedure - do not eat or drink anything during the 2 hours before the procedure, or within the time period that your health care provider recommends. Bowel prep  If you were prescribed an oral bowel prep to  clean out your colon:  Take it as told by your health care provider. Starting the day before your procedure, you will need to drink a large amount of medicated liquid. The liquid will cause you to have multiple loose stools until your stool is almost clear or light green.  If your skin or anus gets irritated from diarrhea, you may use these to relieve the irritation:  Medicated wipes, such as adult wet wipes with aloe and vitamin E.  A skin soothing-product like petroleum jelly.  If you vomit while drinking the bowel prep, take a break for up to 60 minutes and then begin the bowel prep again. If vomiting continues and you cannot take the bowel prep without vomiting, call your health care provider. General instructions   Ask your health care provider about changing or stopping your regular medicines. This is especially important if you are taking diabetes medicines or blood thinners.  Plan to have someone take you home from the hospital or clinic. What happens during the procedure?  An IV tube may be inserted into one of your veins.  You will be given medicine to help you relax (sedative).  To reduce your risk of infection:  Your health care team will wash or sanitize their hands.  Your anal area will be washed with soap.  You will be asked to lie on your side with your knees bent.  Your health care provider will lubricate a long, thin, flexible tube. The tube will have a camera and a light on the end.  The tube will be inserted into your anus.    The tube will be gently eased through your rectum and colon.  Air will be delivered into your colon to keep it open. You may feel some pressure or cramping.  The camera will be used to take images during the procedure.  A small tissue sample may be removed from your body to be examined under a microscope (biopsy). If any potential problems are found, the tissue will be sent to a lab for testing.  If small polyps are found, your health  care provider may remove them and have them checked for cancer cells.  The tube that was inserted into your anus will be slowly removed. The procedure may vary among health care providers and hospitals. What happens after the procedure?  Your blood pressure, heart rate, breathing rate, and blood oxygen level will be monitored until the medicines you were given have worn off.  Do not drive for 24 hours after the exam.  You may have a small amount of blood in your stool.  You may pass gas and have mild abdominal cramping or bloating due to the air that was used to inflate your colon during the exam.  It is up to you to get the results of your procedure. Ask your health care provider, or the department performing the procedure, when your results will be ready. This information is not intended to replace advice given to you by your health care provider. Make sure you discuss any questions you have with your health care provider. Document Released: 03/28/2000 Document Revised: 01/30/2016 Document Reviewed: 06/12/2015 Elsevier Interactive Patient Education  2017 [employer].  The patient is scheduled for a Colonoscopy at [facility] on 11/05/16. They are aware to call the day before to get their arrival time. The patient will complete a two day clear liquid prep. She will take Magnesium Citrate 10 oz on Monday 11/03/16 at noon, and will completed the Miralax prep on 11/04/16 at 5 pm.. She may continue her 81 mg Aspirin. The patient will only take her breathing medication and her blood pressure medications with a sip of water at 6 am the morning of her colonoscopy. Miralax and Magnesium Citrate prescriptions have been sent into the patient's pharmacy. The patient is aware of date and instructions.

## 2016-08-26 NOTE — Progress Notes (Signed)
Patient ID: Alexandra Foster, female   DOB: July 17, 1950, 66 y.o.   MRN: [id number]  Chief Complaint  Patient presents with  . Colonoscopy    HPI Alexandra Foster is a 66 y.o. female.  Who presents for a colonoscopy discussion. The last colonoscopy was completed on 10-29-06 . Denies any gastrointestinal issues. Bowels move every 2 weeks and no bleeding noted. She states nothing OTC helps with her bowels, her mother was like that as well. Recently started on CPAP with improved sleep. Has not noticed any increase in energy. HPI  Past Medical History:  Diagnosis Date  . Asthma   . COPD (chronic obstructive pulmonary disease) ([place])   . Endometriosis   . Hypertension   . Sleep apnea    CPAP    Past Surgical History:  Procedure Laterality Date  . COLONOSCOPY  10/29/2006   Dr Allen Norris  . NASAL SINUS SURGERY  2002   Dr Carlis Abbott    Family History  Problem Relation Age of Onset  . Congestive Heart Failure Mother   . Coronary artery disease Father   . Breast cancer Sister 55    Social History Social History  Substance Use Topics  . Smoking status: Former Smoker    Quit date: 01/05/2009  . Smokeless tobacco: Former Systems developer    Types: Snuff    Quit date: 04/2001  . Alcohol use No    No Known Allergies  Current Outpatient Prescriptions  Medication Sig Dispense Refill  . albuterol (PROVENTIL HFA;VENTOLIN HFA) 108 (90 BASE) MCG/ACT inhaler Inhale into the lungs.    [name] ammonium lactate (LAC-HYDRIN) 12 % cream Apply topically as needed for dry skin. 385 g 0  . aspirin 81 MG tablet Take 1 tablet (81 mg total) by mouth daily. 30 tablet 0  . atorvastatin (LIPITOR) 40 MG tablet Take 1 tablet (40 mg total) by mouth daily. 90 tablet 3  . budesonide-formoterol (SYMBICORT) 80-4.5 MCG/ACT inhaler Inhale 2 puffs into the lungs 2 (two) times daily. 3 Inhaler 1  . mometasone (NASONEX) 50 MCG/ACT nasal spray Place 2 sprays into the nose daily.    [name] omeprazole (PRILOSEC) 20 MG capsule TAKE 1 CAPSULE  EVERY DAY 90 capsule 1  . triamterene-hydrochlorothiazide (DYAZIDE) 37.5-25 MG capsule TAKE 1 CAPSULE EVERY DAY 90 capsule 1   No current facility-administered medications for this visit.     Review of Systems Review of Systems  Constitutional: Negative.   Respiratory: Negative.   Cardiovascular: Negative.   Gastrointestinal: Positive for constipation. Negative for blood in stool.    Blood pressure 134/84, pulse 76, resp. rate 16, height 5' 5.5" (1.664 m), weight 260 lb (117.9 kg).  Physical Exam Physical Exam  Constitutional: She is oriented to person, place, and time. She appears well-developed and well-nourished.  HENT:  Mouth/Throat: Oropharynx is clear and moist.  Eyes: Conjunctivae are normal. No scleral icterus.  Neck: Neck supple.  Cardiovascular: Normal rate, regular rhythm and normal heart sounds.   Pulmonary/Chest: Effort normal and breath sounds normal.  Abdominal: Soft.  Lymphadenopathy:    She has no cervical adenopathy.  Neurological: She is alert and oriented to person, place, and time.  Skin: Skin is warm and dry.  Psychiatric: Her behavior is normal.    Data Reviewed 10/29/2006 colonoscopy showed a 6 mm polyp in the rectum. Pathology not available. Exam was completed by Lucilla Lame, M.D.  Assessment    Candidate for screening colonoscopy.    Plan    In light of her profound obstipation  a 2 day clear liquid diet prep with the addition of a 10 ounce bottle of citrate of magnesia preprocedure date 2 will be implemented.    Colonoscopy with possible biopsy/polypectomy prn: Information regarding the procedure, including its potential risks and complications (including but not limited to perforation of the bowel, which may require emergency surgery to repair, and bleeding) was verbally given to the patient. Educational information regarding lower intestinal endoscopy was given to the patient. Written instructions for how to complete the bowel prep using  Miralax were provided. The importance of drinking ample fluids to avoid dehydration as a result of the prep emphasized.    HPI, Physical Exam, Assessment and Plan have been scribed under the direction and in the presence of Robert Bellow, MD.  Karie Fetch, RN  The patient is scheduled for a Colonoscopy at [facility] on 11/05/16. They are aware to call the day before to get their arrival time. The patient will complete a two day clear liquid prep. She will take Magnesium Citrate 10 oz on Monday 11/03/16 at noon, and will completed the Miralax prep on 11/04/16 at 5 pm.. She may continue her 81 mg Aspirin. The patient will only take her breathing medication and her blood pressure medications with a sip of water at 6 am the morning of her colonoscopy. Miralax and Magnesium Citrate prescriptions have been sent into the patient's pharmacy. The patient is aware of date and instructions.  Documented by Lesly Rubenstein LPN  I have completed the exam and reviewed the above documentation for accuracy and completeness.  I agree with the above.  Haematologist has been used and any errors in dictation or transcription are unintentional.  Hervey Ard, M.D., F.A.C.S.  Robert Bellow 08/27/2016, 7:14 AM

## 2016-08-27 ENCOUNTER — Telehealth: Payer: Self-pay | Admitting: Neurology

## 2016-08-27 DIAGNOSIS — Z1211 Encounter for screening for malignant neoplasm of colon: Secondary | ICD-10-CM | POA: Insufficient documentation

## 2016-08-27 NOTE — Telephone Encounter (Signed)
Patient called office in reference to [facility] stating they have not received any record from our office that patient has been here and compliance with CPAP use.  Patient states if information is not sent over [facility] will not pay and patient will be responsible for cost.  Please call

## 2016-08-27 NOTE — Telephone Encounter (Signed)
Usually when DME needs the notes, they send [place] a request form and we fax the notes in. I will fax in notes now for her.

## 2016-09-01 NOTE — Telephone Encounter (Signed)
LM that we have faxed in that information last week. Left call back number for further questions.

## 2016-09-03 DIAGNOSIS — G4733 Obstructive sleep apnea (adult) (pediatric): Secondary | ICD-10-CM | POA: Diagnosis not present

## 2016-10-04 DIAGNOSIS — G4733 Obstructive sleep apnea (adult) (pediatric): Secondary | ICD-10-CM | POA: Diagnosis not present

## 2016-10-14 DIAGNOSIS — J324 Chronic pansinusitis: Secondary | ICD-10-CM | POA: Diagnosis not present

## 2016-11-03 DIAGNOSIS — G4733 Obstructive sleep apnea (adult) (pediatric): Secondary | ICD-10-CM | POA: Diagnosis not present

## 2016-11-05 ENCOUNTER — Encounter: Payer: Self-pay | Admitting: *Deleted

## 2016-11-05 ENCOUNTER — Ambulatory Visit
Admission: RE | Admit: 2016-11-05 | Discharge: 2016-11-05 | Disposition: A | Discharge status: Home / Self Care | Payer: Medicare HMO | Source: Ambulatory Visit | Attending: General Surgery | Admitting: General Surgery

## 2016-11-05 ENCOUNTER — Ambulatory Visit: Payer: Medicare HMO | Admitting: Anesthesiology

## 2016-11-05 ENCOUNTER — Encounter
Admission: RE | Disposition: A | Discharge status: Home / Self Care | Payer: Self-pay | Source: Ambulatory Visit | Attending: General Surgery

## 2016-11-05 DIAGNOSIS — Z6841 Body Mass Index (BMI) 40.0 and over, adult: Secondary | ICD-10-CM | POA: Diagnosis not present

## 2016-11-05 DIAGNOSIS — D120 Benign neoplasm of cecum: Secondary | ICD-10-CM | POA: Insufficient documentation

## 2016-11-05 DIAGNOSIS — K219 Gastro-esophageal reflux disease without esophagitis: Secondary | ICD-10-CM | POA: Insufficient documentation

## 2016-11-05 DIAGNOSIS — G4730 Sleep apnea, unspecified: Secondary | ICD-10-CM | POA: Diagnosis not present

## 2016-11-05 DIAGNOSIS — K5730 Diverticulosis of large intestine without perforation or abscess without bleeding: Secondary | ICD-10-CM | POA: Diagnosis not present

## 2016-11-05 DIAGNOSIS — K5900 Constipation, unspecified: Secondary | ICD-10-CM | POA: Insufficient documentation

## 2016-11-05 DIAGNOSIS — Z79899 Other long term (current) drug therapy: Secondary | ICD-10-CM | POA: Diagnosis not present

## 2016-11-05 DIAGNOSIS — Z7982 Long term (current) use of aspirin: Secondary | ICD-10-CM | POA: Insufficient documentation

## 2016-11-05 DIAGNOSIS — Z87891 Personal history of nicotine dependence: Secondary | ICD-10-CM | POA: Insufficient documentation

## 2016-11-05 DIAGNOSIS — I10 Essential (primary) hypertension: Secondary | ICD-10-CM | POA: Insufficient documentation

## 2016-11-05 DIAGNOSIS — K5790 Diverticulosis of intestine, part unspecified, without perforation or abscess without bleeding: Secondary | ICD-10-CM | POA: Diagnosis not present

## 2016-11-05 DIAGNOSIS — K635 Polyp of colon: Secondary | ICD-10-CM | POA: Diagnosis not present

## 2016-11-05 DIAGNOSIS — Z1211 Encounter for screening for malignant neoplasm of colon: Secondary | ICD-10-CM | POA: Diagnosis not present

## 2016-11-05 DIAGNOSIS — J449 Chronic obstructive pulmonary disease, unspecified: Secondary | ICD-10-CM | POA: Diagnosis not present

## 2016-11-05 HISTORY — PX: COLONOSCOPY WITH PROPOFOL: SHX5780

## 2016-11-05 SURGERY — COLONOSCOPY WITH PROPOFOL
Anesthesia: General

## 2016-11-05 MED ORDER — LIDOCAINE 2% (20 MG/ML) 5 ML SYRINGE
INTRAMUSCULAR | Status: DC | PRN
  Administered 2016-11-05: 50 mg via INTRAVENOUS

## 2016-11-05 MED ORDER — GLYCOPYRROLATE 0.2 MG/ML IJ SOLN
INTRAMUSCULAR | Status: DC | PRN
  Administered 2016-11-05: 0.2 mg via INTRAVENOUS

## 2016-11-05 MED ORDER — PROPOFOL 500 MG/50ML IV EMUL
INTRAVENOUS | Status: AC
  Filled 2016-11-05: qty 50

## 2016-11-05 MED ORDER — SODIUM CHLORIDE 0.9 % IV SOLN
INTRAVENOUS | Status: DC
  Administered 2016-11-05: 08:00:00 via INTRAVENOUS

## 2016-11-05 MED ORDER — PROPOFOL 10 MG/ML IV BOLUS
INTRAVENOUS | Status: DC | PRN
  Administered 2016-11-05: 60 mg via INTRAVENOUS
  Administered 2016-11-05: 20 mg via INTRAVENOUS

## 2016-11-05 MED ORDER — PROPOFOL 500 MG/50ML IV EMUL
INTRAVENOUS | Status: DC | PRN
  Administered 2016-11-05: 150 ug/kg/min via INTRAVENOUS

## 2016-11-05 NOTE — Anesthesia Preprocedure Evaluation (Signed)
Anesthesia Evaluation  Patient identified by MRN, date of birth, ID band Patient awake    Reviewed: Allergy & Precautions, H&P , NPO status , Patient's Chart, lab work & pertinent test results, reviewed documented beta blocker date and time   History of Anesthesia Complications Negative for: history of anesthetic complications  Airway Mallampati: III  TM Distance: >3 FB Neck ROM: full    Dental  (+) Dental Advidsory Given, Chipped   Pulmonary neg shortness of breath, asthma , sleep apnea and Continuous Positive Airway Pressure Ventilation , COPD,  COPD inhaler, neg recent URI, former smoker,           Cardiovascular Exercise Tolerance: Good hypertension, (-) angina(-) CAD, (-) Past MI, (-) Cardiac Stents and (-) CABG (-) dysrhythmias (-) Valvular Problems/Murmurs     Neuro/Psych negative neurological ROS  negative psych ROS   GI/Hepatic Neg liver ROS, GERD  ,  Endo/Other  neg diabetesMorbid obesity  Renal/GU negative Renal ROS  negative genitourinary   Musculoskeletal   Abdominal   Peds  Hematology negative hematology ROS (+)   Anesthesia Other Findings Past Medical History: No date: Asthma No date: COPD (chronic obstructive pulmonary disease) (HCC) No date: Endometriosis No date: Hypertension No date: Sleep apnea     Comment:  CPAP   Reproductive/Obstetrics negative OB ROS                             Anesthesia Physical Anesthesia Plan  ASA: III  Anesthesia Plan: General   Post-op Pain Management:    Induction: Intravenous  PONV Risk Score and Plan: 3 and Propofol  Airway Management Planned: Natural Airway and Nasal Cannula  Additional Equipment:   Intra-op Plan:   Post-operative Plan:   Informed Consent: I have reviewed the patients History and Physical, chart, labs and discussed the procedure including the risks, benefits and alternatives for the proposed anesthesia  with the patient or authorized representative who has indicated his/her understanding and acceptance.   Dental Advisory Given  Plan Discussed with: Anesthesiologist, CRNA and Surgeon  Anesthesia Plan Comments:         Anesthesia Quick Evaluation

## 2016-11-05 NOTE — Transfer of Care (Signed)
Immediate Anesthesia Transfer of Care Note  Patient: Alexandra Foster  Procedure(s) Performed: Procedure(s): COLONOSCOPY WITH PROPOFOL (N/A)  Patient Location: Endoscopy Unit  Anesthesia Type:General  Level of Consciousness: sedated  Airway & Oxygen Therapy: Patient connected to nasal cannula oxygen  Post-op Assessment: Post -op Vital signs reviewed and stable  Post vital signs: stable  Last Vitals:  Vitals:   11/05/16 0850 11/05/16 0853  BP: 100/66 100/66  Pulse: 74 74  Resp: (!) 23 (!) 23  Temp: (!) 35.6 C (!) 35.6 C    Last Pain:  Vitals:   11/05/16 0853  TempSrc: Tympanic         Complications: No apparent anesthesia complications

## 2016-11-05 NOTE — Anesthesia Post-op Follow-up Note (Cosign Needed)
Anesthesia QCDR form completed.        

## 2016-11-05 NOTE — Anesthesia Postprocedure Evaluation (Signed)
Anesthesia Post Note  Patient: Alexandra Foster  Procedure(s) Performed: Procedure(s) (LRB): COLONOSCOPY WITH PROPOFOL (N/A)  Patient location during evaluation: Endoscopy Anesthesia Type: General Level of consciousness: awake and alert Pain management: pain level controlled Vital Signs Assessment: post-procedure vital signs reviewed and stable Respiratory status: spontaneous breathing, nonlabored ventilation, respiratory function stable and patient connected to nasal cannula oxygen Cardiovascular status: blood pressure returned to baseline and stable Postop Assessment: no signs of nausea or vomiting Anesthetic complications: no     Last Vitals:  Vitals:   11/05/16 0930 11/05/16 0935  BP: 107/67 107/62  Pulse: 66 64  Resp: 17 16  Temp:      Last Pain:  Vitals:   11/05/16 0853  TempSrc: Tympanic                 Martha Clan

## 2016-11-05 NOTE — H&P (Signed)
Alexandra Foster [id number] 07-Aug-1950     HPI: 66 y/o woman for screening colonoscopy. Long history of constipation. Tolerated prep well.   Prescriptions Prior to Admission  Medication Sig Dispense Refill Last Dose  . albuterol (PROVENTIL HFA;VENTOLIN HFA) 108 (90 BASE) MCG/ACT inhaler Inhale into the lungs.   Past Month at Unknown time  . aspirin 81 MG tablet Take 1 tablet (81 mg total) by mouth daily. 30 tablet 0 Past Week at Unknown time  . atorvastatin (LIPITOR) 40 MG tablet Take 1 tablet (40 mg total) by mouth daily. 90 tablet 3 Past Week at Unknown time  . budesonide-formoterol (SYMBICORT) 80-4.5 MCG/ACT inhaler Inhale 2 puffs into the lungs 2 (two) times daily. 3 Inhaler 1 Past Week at Unknown time  . mometasone (NASONEX) 50 MCG/ACT nasal spray Place 2 sprays into the nose daily.   Past Week at Unknown time  . omeprazole (PRILOSEC) 20 MG capsule TAKE 1 CAPSULE EVERY DAY 90 capsule 1 Past Week at Unknown time  . triamterene-hydrochlorothiazide (DYAZIDE) 37.5-25 MG capsule TAKE 1 CAPSULE EVERY DAY 90 capsule 1 Past Week at Unknown time  . ammonium lactate (LAC-HYDRIN) 12 % cream Apply topically as needed for dry skin. 385 g 0 Taking   No Known Allergies Past Medical History:  Diagnosis Date  . Asthma   . COPD (chronic obstructive pulmonary disease) ([place])   . Endometriosis   . Hypertension   . Sleep apnea    CPAP   Past Surgical History:  Procedure Laterality Date  . COLONOSCOPY  10/29/2006   Dr Allen Norris  . NASAL SINUS SURGERY  2002   Dr Carlis Abbott   Social History   Social History  . Marital status: Married    Spouse name: N/A  . Number of children: 1  . Years of education: 7   Occupational History  . Not on file.   Social History Main Topics  . Smoking status: Former Smoker    Quit date: 01/05/2009  . Smokeless tobacco: Former Systems developer    Types: Snuff    Quit date: 04/2001  . Alcohol use No  . Drug use: No  . Sexual activity: No   Other Topics Concern  . Not on  file   Social History Narrative  . No narrative on file   Social History   Social History Narrative  . No narrative on file     ROS: Negative.     PE: HEENT: Negative. Lungs: Clear. Cardio: RR. Robert Bellow 11/05/2016   Assessment/Plan:  Proceed with planned endoscopy.

## 2016-11-05 NOTE — Op Note (Signed)
[facility] Patient Name: Alexandra Foster Procedure Date: 11/05/2016 8:19 AM MRN: [id number] Account #: [id number] Date of Birth: 03-Jul-1950 Admit Type: Outpatient Age: 65 Room: [facility] ENDO ROOM 1 Gender: Female Note Status: Finalized Procedure:            Colonoscopy Providers:            Robert Bellow, MD Referring MD:         Bethena Roys. Sowles, MD (Referring MD) Complications:        No immediate complications. Procedure:            Pre-Anesthesia Assessment:                       - Prior to the procedure, a History and Physical was                        performed, and patient medications, allergies and                        sensitivities were reviewed. The patient's tolerance of                        previous anesthesia was reviewed.                       - The risks and benefits of the procedure and the                        sedation options and risks were discussed with the                        patient. All questions were answered and informed                        consent was obtained.                       After obtaining informed consent, the colonoscope was                        passed under direct vision. Throughout the procedure,                        the patient's blood pressure, pulse, and oxygen                        saturations were monitored continuously. The                        Colonoscope was introduced through the anus and                        advanced to the the cecum, identified by appendiceal                        orifice and ileocecal valve. The colonoscopy was                        performed without difficulty. The patient tolerated the  procedure well. The quality of the bowel preparation                        was excellent. Findings:      A few medium-mouthed diverticula were found in the sigmoid colon.      The retroflexed view of the distal rectum and anal verge was normal and        showed no anal or rectal abnormalities.      A 5 mm polyp was found in the cecum. The polyp was sessile. Biopsies       were taken with a cold forceps for histology. Impression:           - Diverticulosis in the sigmoid colon.                       - The distal rectum and anal verge are normal on                        retroflexion view.                       - No specimens collected. Recommendation:       - Repeat colonoscopy in 10 years for screening purposes.                       - Telephone endoscopist for pathology results in 1 week. Robert Bellow, MD 11/05/2016 8:49:53 AM This report has been signed electronically. Number of Addenda: 0 Note Initiated On: 11/05/2016 8:19 AM Scope Withdrawal Time: 0 hours 10 minutes 7 seconds  Total Procedure Duration: 0 hours 17 minutes 23 seconds       [facility]

## 2016-11-06 ENCOUNTER — Telehealth: Payer: Self-pay

## 2016-11-06 ENCOUNTER — Encounter: Payer: Self-pay | Admitting: General Surgery

## 2016-11-06 LAB — SURGICAL PATHOLOGY

## 2016-11-07 NOTE — Telephone Encounter (Signed)
-----   Message from Robert Bellow, MD sent at 11/06/2016  3:08 PM EDT -----  Please notify the patient that the polyp was OK.  Would recommend a follow up exam in 5 years. Thanks.  ----- Message ----- From: Interface, Lab In Three Zero One Sent: 11/06/2016  11:17 AM To: Robert Bellow, MD

## 2016-11-12 NOTE — Telephone Encounter (Signed)
Notified patient as instructed, patient pleased. Discussed follow-up appointments, patient agrees. Patient placed in recalls.   

## 2016-12-04 DIAGNOSIS — G4733 Obstructive sleep apnea (adult) (pediatric): Secondary | ICD-10-CM | POA: Diagnosis not present

## 2016-12-30 ENCOUNTER — Other Ambulatory Visit: Payer: Self-pay | Admitting: Family Medicine

## 2017-01-04 DIAGNOSIS — G4733 Obstructive sleep apnea (adult) (pediatric): Secondary | ICD-10-CM | POA: Diagnosis not present

## 2017-01-20 ENCOUNTER — Encounter (INDEPENDENT_AMBULATORY_CARE_PROVIDER_SITE_OTHER): Payer: Self-pay

## 2017-01-20 ENCOUNTER — Encounter: Payer: Self-pay | Admitting: Adult Health

## 2017-01-20 ENCOUNTER — Ambulatory Visit (INDEPENDENT_AMBULATORY_CARE_PROVIDER_SITE_OTHER): Payer: Medicare HMO | Admitting: Adult Health

## 2017-01-20 VITALS — BP 127/76 | HR 68 | Ht 65.0 in | Wt 265.4 lb

## 2017-01-20 DIAGNOSIS — Z9989 Dependence on other enabling machines and devices: Secondary | ICD-10-CM | POA: Diagnosis not present

## 2017-01-20 DIAGNOSIS — G4733 Obstructive sleep apnea (adult) (pediatric): Secondary | ICD-10-CM

## 2017-01-20 NOTE — Patient Instructions (Signed)
Your Plan:  Continue using CPAP nightly Tighten straps to avoid mask leaking If your symptoms worsen or you develop new symptoms please let us know.   Thank you for coming to see [place] at [facility]. I hope we have been able to provide you high quality care today.  You may receive a patient satisfaction survey over the next few weeks. We would appreciate your feedback and comments so that we may continue to improve ourselves and the health of our patients.

## 2017-01-20 NOTE — Progress Notes (Signed)
I agree with the assessment and plan as directed by NP .   Ruven Corradi, MD  

## 2017-01-20 NOTE — Progress Notes (Signed)
PATIENT: Alexandra Foster DOB: 19-Nov-1950  REASON FOR VISIT: follow up- OSA on CPAP HISTORY FROM: patient  HISTORY OF PRESENT ILLNESS: Today 01/20/17 Ms.Tozzi is an 66 year old female with a history of obstructive sleep apnea on CPAP. She returns today for a compliance download. Her download indicates that she uses her machine 26 out of 30 days for compliance of 87%. On average she uses her machine 8 hours and 46 minutes. Her residual AHI is 3.2 on 9 cm of water with EPR of 3. The patient reports that occasionally she does have a leak. Reports that she has not tried tightening her straps. She denies any new symptoms. She returns today for an evaluation.  HISTORY 07/21/2016 Copied from Dr. Guadelupe Sabin notes: I reviewed her CPAP compliance data from 06/17/2016 through 07/16/2016, which is a total of 30 days, during which time she used her CPAP 25 days with percent used days greater than 4 hours at 80%, indicating very good compliance with an average usage of 8 hours and 28 minutes, residual AHI 3.7 per hour, leak on the low side with the 95th percentile at 6.2 L/m on a pressure of 9 cm with EPR of 3. She reports doing very well with CPAP. She likes the machine, has no recent trouble using it although she had difficulty in the beginning and had to go back to her DME company for more than one time for reeducation and help with the headgear. Finally she got the hang of it and has been doing rather well. She missed a couple of days because of interim stressors. One stressor has been her 61 year old granddaughter who had problems with her pregnancy, and she had to go to ER with her and stay overnight. She reports feeling better with her sleep including less daytime somnolence, better sleep quality and better sleep consolidation. She is rather pleased with how she is doing.    REVIEW OF SYSTEMS: Out of a complete 14 system review of symptoms, the patient complains only of the following symptoms, and all  other reviewed systems are negative. See HPI  ALLERGIES: No Known Allergies  HOME MEDICATIONS: Outpatient Medications Prior to Visit  Medication Sig Dispense Refill  . albuterol (PROVENTIL HFA;VENTOLIN HFA) 108 (90 BASE) MCG/ACT inhaler Inhale into the lungs.    [name] ammonium lactate (LAC-HYDRIN) 12 % cream Apply topically as needed for dry skin. 385 g 0  . aspirin 81 MG tablet Take 1 tablet (81 mg total) by mouth daily. 30 tablet 0  . atorvastatin (LIPITOR) 40 MG tablet Take 1 tablet (40 mg total) by mouth daily. 90 tablet 3  . budesonide-formoterol (SYMBICORT) 80-4.5 MCG/ACT inhaler Inhale 2 puffs into the lungs 2 (two) times daily. 3 Inhaler 1  . mometasone (NASONEX) 50 MCG/ACT nasal spray Place 2 sprays into the nose daily.    [name] omeprazole (PRILOSEC) 20 MG capsule TAKE 1 CAPSULE EVERY DAY 90 capsule 1  . triamterene-hydrochlorothiazide (DYAZIDE) 37.5-25 MG capsule TAKE 1 CAPSULE EVERY DAY 90 capsule 1   No facility-administered medications prior to visit.     PAST MEDICAL HISTORY: Past Medical History:  Diagnosis Date  . Asthma   . COPD (chronic obstructive pulmonary disease) ([place])   . Endometriosis   . Hypertension   . Sleep apnea    CPAP    PAST SURGICAL HISTORY: Past Surgical History:  Procedure Laterality Date  . COLONOSCOPY  10/29/2006   Dr Allen Norris  . COLONOSCOPY WITH PROPOFOL N/A 11/05/2016   Procedure: COLONOSCOPY WITH PROPOFOL;  Surgeon: Robert Bellow, MD;  [facility]: [facility] ENDOSCOPY;  Service: Endoscopy;  Laterality: N/A;  . NASAL SINUS SURGERY  2002   Dr Carlis Abbott    FAMILY HISTORY: Family History  Problem Relation Age of Onset  . Congestive Heart Failure Mother   . Coronary artery disease Father   . Breast cancer Sister 30    SOCIAL HISTORY: Social History   Social History  . Marital status: Married    Spouse name: N/A  . Number of children: 1  . Years of education: 24   Occupational History  . Not on file.   Social History Main Topics  . Smoking  status: Former Smoker    Quit date: 01/05/2009  . Smokeless tobacco: Former Systems developer    Types: Snuff    Quit date: 04/2001  . Alcohol use No  . Drug use: No  . Sexual activity: No   Other Topics Concern  . Not on file   Social History Narrative  . No narrative on file      PHYSICAL EXAM  Vitals:   01/20/17 1456  BP: 127/76  Pulse: 68  Weight: 265 lb 6.4 oz (120.4 kg)  Height: 5\' 5"  (1.651 m)   Body mass index is 44.16 kg/m.  Generalized: Well developed, in no acute distress   Neurological examination  Mentation: Alert oriented to time, place, history taking. Follows all commands speech and language fluent Cranial nerve II-XII: Pupils were equal round reactive to light. Extraocular movements were full, visual field were full on confrontational test. Facial sensation and strength were normal. Uvula tongue midline. Head turning and shoulder shrug  were normal and symmetric.mallampatti 4 Motor: The motor testing reveals 5 over 5 strength of all 4 extremities. Good symmetric motor tone is noted throughout.  Sensory: Sensory testing is intact to soft touch on all 4 extremities. No evidence of extinction is noted.  Coordination: Cerebellar testing reveals good finger-nose-finger and heel-to-shin bilaterally.  Gait and station: Gait is normal.  Reflexes: Deep tendon reflexes are symmetric and normal bilaterally.   DIAGNOSTIC DATA (LABS, IMAGING, TESTING) - I reviewed patient records, labs, notes, testing and imaging myself where available.  Lab Results  Component Value Date   WBC 6.6 01/08/2016   HGB 12.7 01/08/2016   HCT 40.7 01/08/2016   MCV 79.5 (L) 01/08/2016   PLT 320 01/08/2016      Component Value Date/Time   NA 144 01/08/2016 1543   NA 138 08/11/2013 0933   K 3.7 01/08/2016 1543   K 3.3 (L) 08/11/2013 0933   CL 103 01/08/2016 1543   CL 102 08/11/2013 0933   CO2 27 01/08/2016 1543   CO2 32 08/11/2013 0933   GLUCOSE 110 (H) 01/08/2016 1543   GLUCOSE 107 (H)  08/11/2013 0933   BUN 17 01/08/2016 1543   BUN 13 08/11/2013 0933   CREATININE 0.95 01/08/2016 1543   CALCIUM 9.3 01/08/2016 1543   CALCIUM 9.2 08/11/2013 0933   PROT 7.1 01/08/2016 1543   PROT 8.8 (H) 08/11/2013 0933   ALBUMIN 3.5 (L) 01/08/2016 1543   ALBUMIN 3.1 (L) 08/11/2013 0933   AST 19 01/08/2016 1543   AST 27 08/11/2013 0933   ALT 13 01/08/2016 1543   ALT 26 08/11/2013 0933   ALKPHOS 70 01/08/2016 1543   ALKPHOS 91 08/11/2013 0933   BILITOT 0.4 01/08/2016 1543   BILITOT 0.3 08/11/2013 0933   GFRNONAA 62 01/08/2016 1543   GFRAA 72 01/08/2016 1543   Lab Results  Component Value Date  CHOL 166 01/08/2016   HDL 31 (L) 01/08/2016   LDLCALC 113 01/08/2016   TRIG 110 01/08/2016   CHOLHDL 5.4 (H) 01/08/2016   Lab Results  Component Value Date   HGBA1C 5.8 07/26/2013   No results found for: VITAMINB12 Lab Results  Component Value Date   TSH 1.17 08/11/2013      ASSESSMENT AND PLAN 66 y.o. year old female  has a past medical history of Asthma; COPD (chronic obstructive pulmonary disease) ([place]); Endometriosis; Hypertension; and Sleep apnea. here with:  1. Obstructive sleep apnea on CPAP  The patient CPAP download shows excellent compliance in the treatment of her apnea. She is encouraged to continue using the CPAP nightly. She should also make sure she tightens the straps on her mask to avoid a leak. The patient is advised that if her symptoms worsen or she develops new symptoms she should let us know. She will follow-up in one year with Dr. Rexene Alberts  I spent 15 minutes with the patient. 50% of this time was spent reviewing her CPAP      Ward Givens, MSN, NP-C 01/20/2017, 3:22 PM [facility] [address] [place], [place] [address] [phone]

## 2017-01-28 ENCOUNTER — Telehealth: Payer: Self-pay | Admitting: Family Medicine

## 2017-01-28 NOTE — Telephone Encounter (Signed)
Patient does not have a CPE scheduled, will need one to be scheduled. Patient has not been here since March 14, 2016. Patient is overdue for medication follow up. Also will need a physical to make sure there is no new breast problems and breast exam before we can schedule a mammogram. Called patient house and informed husband to have his wife call back up here to schedule an appointment.

## 2017-01-28 NOTE — Telephone Encounter (Signed)
Pt last mammogram was in October and she is now needing a new order to be placed. It is okay to leave detailed message on home phone [phone]

## 2017-01-29 NOTE — Telephone Encounter (Signed)
appt made for 03/23/17

## 2017-02-03 DIAGNOSIS — G4733 Obstructive sleep apnea (adult) (pediatric): Secondary | ICD-10-CM | POA: Diagnosis not present

## 2017-03-06 DIAGNOSIS — G4733 Obstructive sleep apnea (adult) (pediatric): Secondary | ICD-10-CM | POA: Diagnosis not present

## 2017-03-23 ENCOUNTER — Encounter: Payer: Commercial Managed Care - HMO | Admitting: Family Medicine

## 2017-04-05 DIAGNOSIS — G4733 Obstructive sleep apnea (adult) (pediatric): Secondary | ICD-10-CM | POA: Diagnosis not present

## 2017-04-21 ENCOUNTER — Ambulatory Visit (INDEPENDENT_AMBULATORY_CARE_PROVIDER_SITE_OTHER): Payer: Commercial Managed Care - HMO

## 2017-04-21 VITALS — Ht 65.0 in | Wt 263.4 lb

## 2017-04-21 DIAGNOSIS — Z1231 Encounter for screening mammogram for malignant neoplasm of breast: Secondary | ICD-10-CM

## 2017-04-21 DIAGNOSIS — Z1239 Encounter for other screening for malignant neoplasm of breast: Secondary | ICD-10-CM

## 2017-04-21 DIAGNOSIS — Z0000 Encounter for general adult medical examination without abnormal findings: Secondary | ICD-10-CM

## 2017-04-21 NOTE — Patient Instructions (Addendum)
Alexandra Foster , Thank you for taking time to come for your Medicare Wellness Visit. I appreciate your ongoing commitment to your health goals. Please review the following plan we discussed and let me know if I can assist you in the future.   Screening recommendations/referrals: Colonoscopy: Completed 11/05/16. Repeat every 5 years Mammogram: Completed 02/06/16. Repeat every year. Ordered today. Please call [phone] to schedule your mammogram.  Bone Density: Completed 02/06/16. Osteoporotic screenings no longer required Lung Cancer Screening: You declined my offer to submit your name to Burgess Estelle, RN (Oncology Nurse Navigator) regarding the possible need for this exam. If you should change your mind, please call our office.  Hepatitis C Screening: Completed 01/08/16 Hepatitis B/HIV/Syphillis: You do not qualify for this test  Vision/Dental Exams: Recommended yearly ophthalmology/optometry visit for glaucoma screening and checkup Recommended yearly dental visit for hygiene and checkup  Vaccinations: Influenza vaccine: Declined. Please call our office if you should change your mind. Pneumococcal vaccine: Declined. Please call our office if you should change your mind. Tdap vaccine: Declined. Please call your insurance company to determine your out of pocket expense. You may also receive this vaccine at your local pharmacy or Health Dept. Shingles vaccine: Declined. Please call your insurance company to determine your out of pocket expense. You may also receive this vaccine at your local pharmacy or Health Dept.  Advanced directives: Advance directive discussed with you today. I have provided a copy for you to complete at home and have notarized. Once this is complete please bring a copy in to our office so we can scan it into your chart.  Conditions/risks identified: Recommend to exercise at least 150 minutes per week.  Next appointment: You are scheduled to see Dr. Ancil Boozer on 05/06/17 @  3:20pm.   Please schedule your Annual Wellness Visit with your Nurse Health Advisor in one year.  Preventive Care 3 Years and Older, Female Preventive care refers to lifestyle choices and visits with your health care provider that can promote health and wellness. What does preventive care include?  A yearly physical exam. This is also called an annual well check.  Dental exams once or twice a year.  Routine eye exams. Ask your health care provider how often you should have your eyes checked.  Personal lifestyle choices, including:  Daily care of your teeth and gums.  Regular physical activity.  Eating a healthy diet.  Avoiding tobacco and drug use.  Limiting alcohol use.  Practicing safe sex.  Taking low-dose aspirin every day.  Taking vitamin and mineral supplements as recommended by your health care provider. What happens during an annual well check? The services and screenings done by your health care provider during your annual well check will depend on your age, overall health, lifestyle risk factors, and family history of disease. Counseling  Your health care provider may ask you questions about your:  Alcohol use.  Tobacco use.  Drug use.  Emotional well-being.  Home and relationship well-being.  Sexual activity.  Eating habits.  History of falls.  Memory and ability to understand (cognition).  Work and work Statistician.  Reproductive health. Screening  You may have the following tests or measurements:  Height, weight, and BMI.  Blood pressure.  Lipid and cholesterol levels. These may be checked every 5 years, or more frequently if you are over 66 years old.  Skin check.  Lung cancer screening. You may have this screening every year starting at age 48 if you have a 30-pack-year history of  smoking and currently smoke or have quit within the past 15 years.  Fecal occult blood test (FOBT) of the stool. You may have this test every year  starting at age 39.  Flexible sigmoidoscopy or colonoscopy. You may have a sigmoidoscopy every 5 years or a colonoscopy every 10 years starting at age 31.  Hepatitis C blood test.  Hepatitis B blood test.  Sexually transmitted disease (STD) testing.  Diabetes screening. This is done by checking your blood sugar (glucose) after you have not eaten for a while (fasting). You may have this done every 1-3 years.  Bone density scan. This is done to screen for osteoporosis. You may have this done starting at age 67.  Mammogram. This may be done every 1-2 years. Talk to your health care provider about how often you should have regular mammograms. Talk with your health care provider about your test results, treatment options, and if necessary, the need for more tests. Vaccines  Your health care provider may recommend certain vaccines, such as:  Influenza vaccine. This is recommended every year.  Tetanus, diphtheria, and acellular pertussis (Tdap, Td) vaccine. You may need a Td booster every 10 years.  Zoster vaccine. You may need this after age 11.  Pneumococcal 13-valent conjugate (PCV13) vaccine. One dose is recommended after age 19.  Pneumococcal polysaccharide (PPSV23) vaccine. One dose is recommended after age 26. Talk to your health care provider about which screenings and vaccines you need and how often you need them. This information is not intended to replace advice given to you by your health care provider. Make sure you discuss any questions you have with your health care provider. Document Released: 04/27/2015 Document Revised: 12/19/2015 Document Reviewed: 01/30/2015 Elsevier Interactive Patient Education  2017 [place] Prevention in the Home Falls can cause injuries. They can happen to people of all ages. There are many things you can do to make your home safe and to help prevent falls. What can I do on the outside of my home?  Regularly fix the edges of walkways  and driveways and fix any cracks.  Remove anything that might make you trip as you walk through a door, such as a raised step or threshold.  Trim any bushes or trees on the path to your home.  Use bright outdoor lighting.  Clear any walking paths of anything that might make someone trip, such as rocks or tools.  Regularly check to see if handrails are loose or broken. Make sure that both sides of any steps have handrails.  Any raised decks and porches should have guardrails on the edges.  Have any leaves, snow, or ice cleared regularly.  Use sand or salt on walking paths during winter.  Clean up any spills in your garage right away. This includes oil or grease spills. What can I do in the bathroom?  Use night lights.  Install grab bars by the toilet and in the tub and shower. Do not use towel bars as grab bars.  Use non-skid mats or decals in the tub or shower.  If you need to sit down in the shower, use a plastic, non-slip stool.  Keep the floor dry. Clean up any water that spills on the floor as soon as it happens.  Remove soap buildup in the tub or shower regularly.  Attach bath mats securely with double-sided non-slip rug tape.  Do not have throw rugs and other things on the floor that can make you trip. What can  I do in the bedroom?  Use night lights.  Make sure that you have a light by your bed that is easy to reach.  Do not use any sheets or blankets that are too big for your bed. They should not hang down onto the floor.  Have a firm chair that has side arms. You can use this for support while you get dressed.  Do not have throw rugs and other things on the floor that can make you trip. What can I do in the kitchen?  Clean up any spills right away.  Avoid walking on wet floors.  Keep items that you use a lot in easy-to-reach places.  If you need to reach something above you, use a strong step stool that has a grab bar.  Keep electrical cords out of the  way.  Do not use floor polish or wax that makes floors slippery. If you must use wax, use non-skid floor wax.  Do not have throw rugs and other things on the floor that can make you trip. What can I do with my stairs?  Do not leave any items on the stairs.  Make sure that there are handrails on both sides of the stairs and use them. Fix handrails that are broken or loose. Make sure that handrails are as long as the stairways.  Check any carpeting to make sure that it is firmly attached to the stairs. Fix any carpet that is loose or worn.  Avoid having throw rugs at the top or bottom of the stairs. If you do have throw rugs, attach them to the floor with carpet tape.  Make sure that you have a light switch at the top of the stairs and the bottom of the stairs. If you do not have them, ask someone to add them for you. What else can I do to help prevent falls?  Wear shoes that:  Do not have high heels.  Have rubber bottoms.  Are comfortable and fit you well.  Are closed at the toe. Do not wear sandals.  If you use a stepladder:  Make sure that it is fully opened. Do not climb a closed stepladder.  Make sure that both sides of the stepladder are locked into place.  Ask someone to hold it for you, if possible.  Clearly mark and make sure that you can see:  Any grab bars or handrails.  First and last steps.  Where the edge of each step is.  Use tools that help you move around (mobility aids) if they are needed. These include:  Canes.  Walkers.  Scooters.  Crutches.  Turn on the lights when you go into a dark area. Replace any light bulbs as soon as they burn out.  Set up your furniture so you have a clear path. Avoid moving your furniture around.  If any of your floors are uneven, fix them.  If there are any pets around you, be aware of where they are.  Review your medicines with your doctor. Some medicines can make you feel dizzy. This can increase your chance  of falling. Ask your doctor what other things that you can do to help prevent falls. This information is not intended to replace advice given to you by your health care provider. Make sure you discuss any questions you have with your health care provider. Document Released: 01/25/2009 Document Revised: 09/06/2015 Document Reviewed: 05/05/2014 Elsevier Interactive Patient Education  2017 [employer].

## 2017-04-21 NOTE — Progress Notes (Signed)
Subjective:   Alexandra Foster is a 67 y.o. female who presents for Medicare Annual (Subsequent) preventive examination.  Review of Systems:  N/A Cardiac Risk Factors include: sedentary lifestyle;advanced age (>70men, >34 women);hypertension;obesity (BMI >30kg/m2);family history of premature cardiovascular disease     Objective:     Vitals: Ht 5\' 5"  (1.651 m)   Wt 263 lb 6.4 oz (119.5 kg)   BMI 43.83 kg/m   Body mass index is 43.83 kg/m.  Advanced Directives 04/21/2017 11/05/2016 01/08/2016 04/13/2015 09/18/2014 09/18/2014  Does Patient Have a Medical Advance Directive? No No No No No No  Would patient like information on creating a medical advance directive? Yes (MAU/Ambulatory/Procedural Areas - Information given) No - Patient declined - No - patient declined information - -   Pt has been provided with the "MOST" and "DNR" documents for her review. Pt has been advised that she will only need to complete the document that is most appropriate to suit her health care wishes. Once completed, pt has been advised to return the appropriate document to the office for the physician to review and sign. Verbalized acceptance and understanding.  Tobacco Social History   Tobacco Use  Smoking Status Former Smoker  . Packs/day: 2.00  . Years: 40.00  . Pack years: 80.00  . Types: Cigarettes  . Last attempt to quit: 2008  . Years since quitting: 11.0  Smokeless Tobacco Former Systems developer  . Types: Snuff  . Quit date: 04/2001  Tobacco Comment   smoking cessation materials not required     Counseling given: No Comment: smoking cessation materials not required   Clinical Intake:  Pre-visit preparation completed: Yes  Pain : No/denies pain  BMI - recorded: 43.83 Nutritional Status: BMI > 30  Obese Has the patient had any N/V/D within the last 2 months?  No Has the patient had any unintentional weight loss or weight gain?  No Does the patient have any non-healing wounds? Yes, rash under  pannus. Uses cornstarch or powder to prevent break down. Diabetes: No How often do you need to have someone help you when you read instructions, pamphlets, or other written materials from your doctor or pharmacy?: 1 - Never  Interpreter Needed?: No  Information entered by :: Idell Pickles, LPN  Past Medical History:  Diagnosis Date  . Asthma   . COPD (chronic obstructive pulmonary disease) ([place])   . Endometriosis   . Hypertension   . Sleep apnea    CPAP   Past Surgical History:  Procedure Laterality Date  . COLONOSCOPY  10/29/2006   Dr Allen Norris  . COLONOSCOPY WITH PROPOFOL N/A 11/05/2016   Procedure: COLONOSCOPY WITH PROPOFOL;  Surgeon: Robert Bellow, MD;  Location: [facility] ENDOSCOPY;  Service: Endoscopy;  Laterality: N/A;  . NASAL SINUS SURGERY  2002   Dr Carlis Abbott   Family History  Problem Relation Age of Onset  . Congestive Heart Failure Mother   . Coronary artery disease Father   . Breast cancer Sister 59  . Heart disease Brother   . Varicose Veins Brother   . Alcohol abuse Brother    Social History   Socioeconomic History  . Marital status: Married    Spouse name: Trilby Drummer  . Number of children: 1  . Years of education: 35  . Highest education level: 12th grade  Social Needs  . Financial resource strain: Not hard at all  . Food insecurity - worry: Never true  . Food insecurity - inability: Never true  . Transportation needs -  medical: No  . Transportation needs - non-medical: No  Occupational History    Employer: RETIRED  Tobacco Use  . Smoking status: Former Smoker    Packs/day: 2.00    Years: 40.00    Pack years: 80.00    Types: Cigarettes    Last attempt to quit: 2008    Years since quitting: 11.0  . Smokeless tobacco: Former Systems developer    Types: Snuff    Quit date: 04/2001  . Tobacco comment: smoking cessation materials not required  Substance and Sexual Activity  . Alcohol use: No    Alcohol/week: 0.0 oz  . Drug use: No  . Sexual activity: No  Other Topics  Concern  . None  Social History Narrative  . None    Outpatient Encounter Medications as of 04/21/2017  Medication Sig  . albuterol (PROVENTIL HFA;VENTOLIN HFA) 108 (90 BASE) MCG/ACT inhaler Inhale into the lungs.  [name] aspirin 81 MG tablet Take 1 tablet (81 mg total) by mouth daily.  [name] atorvastatin (LIPITOR) 40 MG tablet Take 1 tablet (40 mg total) by mouth daily.  . mometasone (NASONEX) 50 MCG/ACT nasal spray Place 2 sprays into the nose daily.  [name] omeprazole (PRILOSEC) 20 MG capsule TAKE 1 CAPSULE EVERY DAY  . triamterene-hydrochlorothiazide (DYAZIDE) 37.5-25 MG capsule TAKE 1 CAPSULE EVERY DAY  . budesonide-formoterol (SYMBICORT) 80-4.5 MCG/ACT inhaler Inhale 2 puffs into the lungs 2 (two) times daily.  . [DISCONTINUED] ammonium lactate (LAC-HYDRIN) 12 % cream Apply topically as needed for dry skin.   No facility-administered encounter medications on file as of 04/21/2017.     Activities of Daily Living In your present state of health, do you have any difficulty performing the following activities: 04/21/2017  Hearing? N  Comment denies use of hearing aids  Vision? N  Comment wears eyeglasses  Difficulty concentrating or making decisions? Y  Comment short term memory loss  Walking or climbing stairs? Y  Comment shortness of breath and joint pain  Dressing or bathing? N  Doing errands, shopping? N  Preparing Food and eating ? N  Comment denies uese of dentures  Using the Toilet? N  In the past six months, have you accidently leaked urine? N  Do you have problems with loss of bowel control? N  Managing your Medications? N  Managing your Finances? N  Housekeeping or managing your Housekeeping? N  Some recent data might be hidden    Patient Care Team: Steele Sizer, MD as PCP - General (Family Medicine) Star Age, MD as Consulting Physician (Neurology)    Assessment:   This is a routine wellness examination for [place].  Exercise Activities and Dietary  recommendations Current Exercise Habits: The patient does not participate in regular exercise at present, Exercise limited by: None identified  Goals    . Exercise 150 min/wk Moderate Activity     Recommend to exercise at least 150 minutes per week.        Fall Risk Fall Risk  04/21/2017 03/14/2016 01/08/2016 04/13/2015  Falls in the past year? No No No No   Is the patient's home free of loose throw rugs in walkways, pet beds, electrical cords, etc?   yes      Grab bars in the bathroom? yes Denies use of a shower chair      Handrails on the stairs?   No. States she does not have any stairs in or around home      Adequate lighting?   yes   Denies use of  a walker, cane or w/c. States she does use of an elevated toilet seat.  Timed Get Up and Go performed: Yes. 15 sec to ambulate 10 feet. Gait slow and steady. No intervention required.  Depression Screen PHQ 2/9 Scores 04/21/2017 03/14/2016 01/08/2016 04/13/2015  PHQ - 2 Score 0 0 0 0     Cognitive Function     6CIT Screen 04/21/2017  What Year? 0 points  What month? 0 points  What time? 0 points  Count back from 20 0 points  Months in reverse 4 points  Repeat phrase 6 points  Total Score 10    Immunization History  Administered Date(s) Administered  . Meningococcal Conjugate 12/10/2006  . Td 12/10/2006    Qualifies for Shingles Vaccine? Yes. Due for Zostavax or Shingrix vaccine. Education has been provided regarding the importance of this vaccine. Pt has been advised to call her insurance company to determine her out of pocket expense. Advised she may also receive this vaccine at her local pharmacy or Health Dept. Verbalized acceptance and understanding.  Due for Tdap vaccine. Declined my offer to administer today. Education has been provided regarding the importance of this vaccine but still declined. Pt has been advised to call her insurance company to determine her out of pocket expense. Advised she may also receive this  vaccine at her local pharmacy or Health Dept. Verbalized acceptance and understanding.  Due for Flu vaccine. Declined my offer to administer today. Education has been provided regarding the importance of this vaccine but still declined. Pt has been advised to call our office should she change her mind an want to receive this vaccine. Also advised she may receive this vaccine at her local pharmacy or Health Dept. Verbalized acceptance and understanding.  Due for Pneumococcal vaccine. Declined my offer to administer today. Education has been provided regarding the importance of this vaccine but still declined. Pt has been advised to call our office should she change her mind an want to receive this vaccine. Also advised she may receive this vaccine at her local pharmacy or Health Dept. Verbalized acceptance and understanding.  Screening Tests Health Maintenance  Topic Date Due  . MAMMOGRAM  02/05/2017  . INFLUENZA VACCINE  12/22/2017 (Originally 11/12/2016)  . TETANUS/TDAP  04/21/2018 (Originally 12/09/2016)  . PNA vac Low Risk Adult (1 of 2 - PCV13) 04/21/2018 (Originally 11/05/2013)  . COLONOSCOPY  11/05/2021  . DEXA SCAN  Completed  . Hepatitis C Screening  Completed    Cancer Screenings: Lung: Low Dose CT Chest recommended if Age 2-80 years, 30 pack-year currently smoking OR have quit w/in 15years. Patient does qualify. Declined my offer to submit her name to Burgess Estelle, RN (Oncology Nurse Navigator) regarding the possible need for this exam. Education has been provided regarding the importance of this exam but still declined. Pt was advised to call our office if she should change her mind. Verbalized acceptance and understanding. Breast:  Up to date on Mammogram? No  Completed 02/06/16. Repeat every year. Mammogram ordered today. The patient has been provided with contact information to schedule her own appointment. Up to date of Bone Density/Dexa? Yes. Completed 02/06/16. Osteoporotic  screenings no longer required Colorectal: Completed colonoscopy 11/05/16. Repeat every 5 years  Additional Screenings: Hepatitis B/HIV/Syphillis: Does not qualify for this test Hepatitis C Screening: Completed 01/08/16     Plan:  In preparation for this patient's Medicare Annual Wellness visit, I have personally reviewed the following information in the patient's chart:  A. Medical and  Surgical History B. Office visits, Hospitalizations and ER visits within the last 12 months C. Radiology, Laboratory and Pathological Reports (including those records in [place]) D. Health Maintenance for accuracy and any attached, scanned or relevant reports or letters E.  Immunization History F.  Upcoming referrals and appointments G. Advance directives and Code Status  I have personally addressed the Medicare Wellness Questionnaire with the patient and have noted and/or up dated the following information:  A.  Current medications (including OTC medications) B. Medication Allergies C. Medical and Surgical History D.  Physicians on the patient's care team [place] Physical activity G. Functional ability and status H. Nutritional status I. Risk for fall and preventative measures (including TUG test) J. Use of alcohol, tobacco or illicit drugs  K.          Screenings such as hearing, vision, cognitive and depression L. Advance Directives and Code Status M. Realistic Patient Goals N. Financial and Social strains, if any  In addition, I have reviewed and discussed with the patient, certain preventive protocols, quality metrics, and best practice recommendations. To ensure quality care and proper  continuity of care, any concerns that arose during the Medicare Wellness visit were addressed with the patient's physician. A written personalized care plan for preventive services as well as general preventive health recommendations were provided to patient.  Signed,  Aleatha Borer, LPN  I have  reviewed this encounter including the documentation in this note and/or discussed this patient with the provider, Aleatha Borer, LPN. I am certifying that I agree with the content of this note as supervising physician.  Steele Sizer, MD [facility] 04/21/2017, 4:39 PM

## 2017-05-01 ENCOUNTER — Other Ambulatory Visit: Payer: Self-pay | Admitting: Family Medicine

## 2017-05-01 DIAGNOSIS — I6529 Occlusion and stenosis of unspecified carotid artery: Secondary | ICD-10-CM

## 2017-05-01 DIAGNOSIS — I10 Essential (primary) hypertension: Secondary | ICD-10-CM

## 2017-05-01 DIAGNOSIS — K219 Gastro-esophageal reflux disease without esophagitis: Secondary | ICD-10-CM

## 2017-05-04 NOTE — Telephone Encounter (Signed)
Refill request for Hypertension medication:  Dyazide 37.5-25 mg Last office visit pertaining to hypertension: 03/14/2016  BP Readings from Last 3 Encounters:  01/20/17 127/76  11/05/16 107/62  08/26/16 134/84   Follow-up on file. 05/06/2017   Lab Results  Component Value Date   CREATININE 0.95 01/08/2016   BUN 17 01/08/2016   NA 144 01/08/2016   K 3.7 01/08/2016   CL 103 01/08/2016   CO2 27 01/08/2016    Refill Request for Cholesterol medication. Atorvastatin 40 mg  Last physical: 01/08/2016  Lab Results  Component Value Date   CHOL 166 01/08/2016   HDL 31 (L) 01/08/2016   LDLCALC 113 01/08/2016   TRIG 110 01/08/2016   CHOLHDL 5.4 (H) 01/08/2016

## 2017-05-06 ENCOUNTER — Ambulatory Visit: Payer: Commercial Managed Care - HMO | Admitting: Family Medicine

## 2017-05-06 ENCOUNTER — Encounter: Payer: Self-pay | Admitting: Family Medicine

## 2017-05-06 VITALS — BP 124/76 | HR 80 | Temp 98.0°F | Resp 18 | Ht 65.0 in | Wt 264.2 lb

## 2017-05-06 DIAGNOSIS — J411 Mucopurulent chronic bronchitis: Secondary | ICD-10-CM

## 2017-05-06 DIAGNOSIS — I6529 Occlusion and stenosis of unspecified carotid artery: Secondary | ICD-10-CM | POA: Diagnosis not present

## 2017-05-06 DIAGNOSIS — I10 Essential (primary) hypertension: Secondary | ICD-10-CM | POA: Diagnosis not present

## 2017-05-06 DIAGNOSIS — Z23 Encounter for immunization: Secondary | ICD-10-CM

## 2017-05-06 DIAGNOSIS — R739 Hyperglycemia, unspecified: Secondary | ICD-10-CM

## 2017-05-06 DIAGNOSIS — Z0000 Encounter for general adult medical examination without abnormal findings: Secondary | ICD-10-CM

## 2017-05-06 DIAGNOSIS — E785 Hyperlipidemia, unspecified: Secondary | ICD-10-CM

## 2017-05-06 DIAGNOSIS — K219 Gastro-esophageal reflux disease without esophagitis: Secondary | ICD-10-CM | POA: Diagnosis not present

## 2017-05-06 DIAGNOSIS — G4733 Obstructive sleep apnea (adult) (pediatric): Secondary | ICD-10-CM | POA: Diagnosis not present

## 2017-05-06 MED ORDER — UMECLIDINIUM-VILANTEROL 62.5-25 MCG/INH IN AEPB: 1.0000 | INHALATION_SPRAY | Freq: Every day | RESPIRATORY_TRACT | 1 refills | Status: DC

## 2017-05-06 MED ORDER — TRIAMTERENE-HCTZ 37.5-25 MG PO CAPS: 1.0000 | ORAL_CAPSULE | Freq: Every day | ORAL | 1 refills | Status: DC

## 2017-05-06 MED ORDER — ATORVASTATIN CALCIUM 40 MG PO TABS: 40.0000 mg | ORAL_TABLET | Freq: Every day | ORAL | 1 refills | Status: DC

## 2017-05-06 MED ORDER — OMEPRAZOLE 20 MG PO CPDR: 20.0000 mg | DELAYED_RELEASE_CAPSULE | Freq: Every day | ORAL | 1 refills | Status: DC

## 2017-05-06 NOTE — Patient Instructions (Signed)
Preventive Care 69 Years and Older, Female Preventive care refers to lifestyle choices and visits with your health care provider that can promote health and wellness. What does preventive care include?  A yearly physical exam. This is also called an annual well check.  Dental exams once or twice a year.  Routine eye exams. Ask your health care provider how often you should have your eyes checked.  Personal lifestyle choices, including: ? Daily care of your teeth and gums. ? Regular physical activity. ? Eating a healthy diet. ? Avoiding tobacco and drug use. ? Limiting alcohol use. ? Practicing safe sex. ? Taking low-dose aspirin every day. ? Taking vitamin and mineral supplements as recommended by your health care provider. What happens during an annual well check? The services and screenings done by your health care provider during your annual well check will depend on your age, overall health, lifestyle risk factors, and family history of disease. Counseling Your health care provider may ask you questions about your:  Alcohol use.  Tobacco use.  Drug use.  Emotional well-being.  Home and relationship well-being.  Sexual activity.  Eating habits.  History of falls.  Memory and ability to understand (cognition).  Work and work Statistician.  Reproductive health.  Screening You may have the following tests or measurements:  Height, weight, and BMI.  Blood pressure.  Lipid and cholesterol levels. These may be checked every 5 years, or more frequently if you are over 45 years old.  Skin check.  Lung cancer screening. You may have this screening every year starting at age 59 if you have a 30-pack-year history of smoking and currently smoke or have quit within the past 15 years.  Fecal occult blood test (FOBT) of the stool. You may have this test every year starting at age 50.  Flexible sigmoidoscopy or colonoscopy. You may have a sigmoidoscopy every 5 years or  a colonoscopy every 10 years starting at age 21.  Hepatitis C blood test.  Hepatitis B blood test.  Sexually transmitted disease (STD) testing.  Diabetes screening. This is done by checking your blood sugar (glucose) after you have not eaten for a while (fasting). You may have this done every 1-3 years.  Bone density scan. This is done to screen for osteoporosis. You may have this done starting at age 73.  Mammogram. This may be done every 1-2 years. Talk to your health care provider about how often you should have regular mammograms.  Talk with your health care provider about your test results, treatment options, and if necessary, the need for more tests. Vaccines Your health care provider may recommend certain vaccines, such as:  Influenza vaccine. This is recommended every year.  Tetanus, diphtheria, and acellular pertussis (Tdap, Td) vaccine. You may need a Td booster every 10 years.  Varicella vaccine. You may need this if you have not been vaccinated.  Zoster vaccine. You may need this after age 20.  Measles, mumps, and rubella (MMR) vaccine. You may need at least one dose of MMR if you were born in 1957 or later. You may also need a second dose.  Pneumococcal 13-valent conjugate (PCV13) vaccine. One dose is recommended after age 90.  Pneumococcal polysaccharide (PPSV23) vaccine. One dose is recommended after age 72.  Meningococcal vaccine. You may need this if you have certain conditions.  Hepatitis A vaccine. You may need this if you have certain conditions or if you travel or work in places where you may be exposed to hepatitis  A.  Hepatitis B vaccine. You may need this if you have certain conditions or if you travel or work in places where you may be exposed to hepatitis B.  Haemophilus influenzae type b (Hib) vaccine. You may need this if you have certain conditions.  Talk to your health care provider about which screenings and vaccines you need and how often you  need them. This information is not intended to replace advice given to you by your health care provider. Make sure you discuss any questions you have with your health care provider. Document Released: 04/27/2015 Document Revised: 12/19/2015 Document Reviewed: 01/30/2015 Elsevier Interactive Patient Education  2018 Elsevier Inc.  

## 2017-05-06 NOTE — Progress Notes (Signed)
Name: Alexandra Foster   MRN: [id number]    DOB: Feb 28, 1951   Date:05/06/2017       Progress Note  Subjective  Chief Complaint  Chief Complaint  Patient presents with  . Annual Exam  . Hyperlipidemia  . Hypertension    HPI  Well Woman: she is married but they have not been sexually active for along time, husband has ED, she states she does not miss having sex, no vaginal discharge, s/p hysterectomy for DUB, up to date with all screening, refuses all immunization, even after counseling.   HTN: she has not been seen in over one year, she states compliant with medication, no chest pain or palpitation, she has mild lower extremity edema and wears compression stocking hoses daily  Dyslipidemia and carotid artery stenosis: mild taking atorvastatin, needs to have labs done, denies side effects of medication  COPD: doing well, sees Dr. Pryor Ochoa and states no longer on Symbicort, discussed options and we will try switching to Anoro, she states no cough, but has SOB with activity, it may be multifactorial.   Morbid obesity: she has been obese since reconstructive sinus surgery 11 years ago she had to take steroids and gained weight, unable to lose weight since. Weight has been stable, she has a history of hyperglycemia, we will check hgbA1C. She states she likes to drink soda, 120 ounces of mountain Dew daily, discussed importance of weaning self off and try to move more.   GERD: discussed risk associated with long term PPI use, explained that stopping sodas may control GERD symptoms.   OSA: sees neurologist every 6 months , very compliant with CPAP machine, she wakes feeling rested, no longer snoring at night. Echo showed enlarged right atrium before starting on CPAP    Patient Active Problem List   Diagnosis Date Noted  . Encounter for screening colonoscopy 08/27/2016  . Urge incontinence 03/14/2016  . Osteopenia 02/13/2016  . OSA (obstructive sleep apnea) 01/08/2016  . Allergic rhinitis  04/13/2015  . Arterial vascular disease 09/18/2014  . Carotid artery plaque 09/18/2014  . COPD (chronic obstructive pulmonary disease) ([place]) 09/18/2014  . Morbid obesity ([place]) 09/18/2014  . Esophagitis, reflux 09/18/2014  . Essential hypertension 09/21/2006    Past Surgical History:  Procedure Laterality Date  . COLONOSCOPY  10/29/2006   Dr Allen Norris  . COLONOSCOPY WITH PROPOFOL N/A 11/05/2016   Procedure: COLONOSCOPY WITH PROPOFOL;  Surgeon: Robert Bellow, MD;  Location: [facility] ENDOSCOPY;  Service: Endoscopy;  Laterality: N/A;  . NASAL SINUS SURGERY  2002   Dr Carlis Abbott    Family History  Problem Relation Age of Onset  . Congestive Heart Failure Mother   . Coronary artery disease Father   . Breast cancer Sister 39  . Heart disease Brother   . Varicose Veins Brother   . Alcohol abuse Brother     Social History   Socioeconomic History  . Marital status: Married    Spouse name: Trilby Drummer  . Number of children: 1  . Years of education: 6  . Highest education level: 12th grade  Social Needs  . Financial resource strain: Not hard at all  . Food insecurity - worry: Never true  . Food insecurity - inability: Never true  . Transportation needs - medical: No  . Transportation needs - non-medical: No  Occupational History    Employer: RETIRED  Tobacco Use  . Smoking status: Former Smoker    Packs/day: 2.00    Years: 40.00    Pack years:  80.00    Types: Cigarettes    Last attempt to quit: 2008    Years since quitting: 11.0  . Smokeless tobacco: Former Systems developer    Types: Snuff    Quit date: 04/2001  . Tobacco comment: smoking cessation materials not required  Substance and Sexual Activity  . Alcohol use: No    Alcohol/week: 0.0 oz  . Drug use: No  . Sexual activity: No  Other Topics Concern  . Not on file  Social History Narrative  . Not on file     Current Outpatient Medications:  .  albuterol (PROVENTIL HFA;VENTOLIN HFA) 108 (90 BASE) MCG/ACT inhaler, Inhale into the  lungs., Disp: , Rfl:  .  aspirin 81 MG tablet, Take 1 tablet (81 mg total) by mouth daily., Disp: 30 tablet, Rfl: 0 .  atorvastatin (LIPITOR) 40 MG tablet, Take 1 tablet (40 mg total) by mouth daily., Disp: 90 tablet, Rfl: 1 .  omeprazole (PRILOSEC) 20 MG capsule, Take 1 capsule (20 mg total) by mouth daily., Disp: 90 capsule, Rfl: 1 .  triamterene-hydrochlorothiazide (DYAZIDE) 37.5-25 MG capsule, Take 1 each (1 capsule total) by mouth daily., Disp: 90 capsule, Rfl: 1 .  umeclidinium-vilanterol (ANORO ELLIPTA) 62.5-25 MCG/INH AEPB, Inhale 1 puff into the lungs daily., Disp: 180 each, Rfl: 1  No Known Allergies   ROS  Constitutional: Negative for fever or weight change.  Respiratory: Negative for cough, she has  shortness of breath with activity.   Cardiovascular: Negative for chest pain or palpitations.  Gastrointestinal: Negative for abdominal pain, no bowel changes.  Musculoskeletal: Negative for gait problem or joint swelling.  Skin: Negative for rash.  Neurological: Negative for dizziness or headache.  No other specific complaints in a complete review of systems (except as listed in HPI above).  Objective  Vitals:   05/06/17 1529  BP: 124/76  Pulse: 80  Resp: 18  Temp: 98 F (36.7 C)  TempSrc: Oral  SpO2: 94%  Weight: 264 lb 3.2 oz (119.8 kg)  Height: 5\' 5"  (1.651 m)    Body mass index is 43.97 kg/m.  Physical Exam  Constitutional: Patient appears well-developed and obesity  No distress.  HENT: Head: Normocephalic and atraumatic. Ears: B TMs ok, no erythema or effusion; Nose: Nose normal. Mouth/Throat: Oropharynx is clear and moist. No oropharyngeal exudate.  Eyes: Conjunctivae and EOM are normal. Pupils are equal, round, and reactive to light. No scleral icterus.  Neck: Normal range of motion. Neck supple. No JVD present. No thyromegaly present.  Cardiovascular: Normal rate, regular rhythm and normal heart sounds.  No murmur heard. No BLE edema. Pulmonary/Chest:  Effort normal and breath sounds normal. No respiratory distress. Abdominal: Soft. Bowel sounds are normal, no distension. There is no tenderness. no masses Breast: no lumps or masses, no nipple discharge or rashes FEMALE GENITALIA:  Exam not done Pelvic not done, s/p hysterectomy  RECTAL:not done Musculoskeletal: Normal range of motion, no joint effusions. No gross deformities Neurological: he is alert and oriented to person, place, and time. No cranial nerve deficit. Coordination, balance, strength, speech and gait are normal.  Skin: Skin is warm and dry. Hyperpigmentation under breast. No erythema.  Psychiatric: Patient has a normal mood and affect. behavior is normal. Judgment and thought content normal.  PHQ2/9: Depression screen [facility] 2/9 04/21/2017 03/14/2016 01/08/2016 04/13/2015  Decreased Interest 0 0 0 0  Down, Depressed, Hopeless 0 0 0 0  PHQ - 2 Score 0 0 0 0     Fall Risk: Fall Risk  04/21/2017 03/14/2016 01/08/2016 04/13/2015  Falls in the past year? No No No No      Assessment & Plan  1. Encounter for preventive health examination  Discussed importance of 150 minutes of physical activity weekly, eat two servings of fish weekly, eat one serving of tree nuts ( cashews, pistachios, pecans, almonds.[name]) every other day, eat 6 servings of fruit/vegetables daily and drink plenty of water and avoid sweet beverages.   2. Carotid artery plaque, unspecified laterality  - atorvastatin (LIPITOR) 40 MG tablet; Take 1 tablet (40 mg total) by mouth daily.  Dispense: 90 tablet; Refill: 1  3. Essential hypertension  - triamterene-hydrochlorothiazide (DYAZIDE) 37.5-25 MG capsule; Take 1 each (1 capsule total) by mouth daily.  Dispense: 90 capsule; Refill: 1 - COMPLETE METABOLIC PANEL WITH GFR - CBC with Differential/Platelet  4. Gastroesophageal reflux disease without esophagitis  - omeprazole (PRILOSEC) 20 MG capsule; Take 1 capsule (20 mg total) by mouth daily.  Dispense: 90 capsule;  Refill: 1  5. Hyperglycemia  - Hemoglobin A1c  6. Dyslipidemia  - Lipid panel  7. Morbid obesity ([place])  Discussed with the patient the risk posed by an increased BMI. Discussed importance of portion control, calorie counting and at least 150 minutes of physical activity weekly. Avoid sweet beverages and drink more water. Eat at least 6 servings of fruit and vegetables daily   8. Mucopurulent chronic bronchitis (HCC)  Seeing Dr. Pryor Ochoa, and is using Symbicort  - umeclidinium-vilanterol (ANORO ELLIPTA) 62.5-25 MCG/INH AEPB; Inhale 1 puff into the lungs daily.  Dispense: 180 each; Refill: 1  9. Needs flu shot  refused  10. Need for vaccination for pneumococcus  Refused  11. Need for shingles vaccine  refused

## 2017-05-13 DIAGNOSIS — E785 Hyperlipidemia, unspecified: Secondary | ICD-10-CM | POA: Diagnosis not present

## 2017-05-13 DIAGNOSIS — R739 Hyperglycemia, unspecified: Secondary | ICD-10-CM | POA: Diagnosis not present

## 2017-05-13 DIAGNOSIS — I10 Essential (primary) hypertension: Secondary | ICD-10-CM | POA: Diagnosis not present

## 2017-05-14 LAB — CBC WITH DIFFERENTIAL/PLATELET
BASOS ABS: 45 {cells}/uL (ref 0–200)
BASOS PCT: 0.4 %
EOS PCT: 1.9 %
Eosinophils Absolute: 213 cells/uL (ref 15–500)
HCT: 40.5 % (ref 35.0–45.0)
HEMOGLOBIN: 12.5 g/dL (ref 11.7–15.5)
Lymphs Abs: 2576 cells/uL (ref 850–3900)
MCH: 23.4 pg — ABNORMAL LOW (ref 27.0–33.0)
MCHC: 30.9 g/dL — AB (ref 32.0–36.0)
MCV: 75.8 fL — ABNORMAL LOW (ref 80.0–100.0)
MONOS PCT: 6.2 %
MPV: 10.4 fL (ref 7.5–12.5)
NEUTROS ABS: 7672 {cells}/uL (ref 1500–7800)
Neutrophils Relative %: 68.5 %
PLATELETS: 394 10*3/uL (ref 140–400)
RBC: 5.34 10*6/uL — ABNORMAL HIGH (ref 3.80–5.10)
RDW: 13.3 % (ref 11.0–15.0)
TOTAL LYMPHOCYTE: 23 %
WBC mixed population: 694 cells/uL (ref 200–950)
WBC: 11.2 10*3/uL — ABNORMAL HIGH (ref 3.8–10.8)

## 2017-05-14 LAB — COMPLETE METABOLIC PANEL WITH GFR
AG Ratio: 1.3 (calc) (ref 1.0–2.5)
ALBUMIN MSPROF: 3.6 g/dL (ref 3.6–5.1)
ALT: 29 U/L (ref 6–29)
AST: 22 U/L (ref 10–35)
Alkaline phosphatase (APISO): 94 U/L (ref 33–130)
BUN/Creatinine Ratio: 30 (calc) — ABNORMAL HIGH (ref 6–22)
BUN: 27 mg/dL — ABNORMAL HIGH (ref 7–25)
CALCIUM: 9.2 mg/dL (ref 8.6–10.4)
CO2: 34 mmol/L — AB (ref 20–32)
CREATININE: 0.89 mg/dL (ref 0.50–0.99)
Chloride: 105 mmol/L (ref 98–110)
GFR, EST NON AFRICAN AMERICAN: 67 mL/min/{1.73_m2} (ref >=60)
GFR, Est African American: 77 mL/min/{1.73_m2} (ref >=60)
GLOBULIN: 2.7 g/dL (ref 1.9–3.7)
Glucose, Bld: 115 mg/dL — ABNORMAL HIGH (ref 65–99)
Potassium: 4.1 mmol/L (ref 3.5–5.3)
SODIUM: 145 mmol/L (ref 135–146)
TOTAL PROTEIN: 6.3 g/dL (ref 6.1–8.1)
Total Bilirubin: 0.3 mg/dL (ref 0.2–1.2)

## 2017-05-14 LAB — HEMOGLOBIN A1C
HEMOGLOBIN A1C: 6.6 %{Hb} — AB (ref <5.7)
Mean Plasma Glucose: 143 (calc)
eAG (mmol/L): 7.9 (calc)

## 2017-05-14 LAB — LIPID PANEL
CHOL/HDL RATIO: 2.4 (calc) (ref <5.0)
Cholesterol: 117 mg/dL (ref <200)
HDL: 48 mg/dL — ABNORMAL LOW (ref >50)
LDL Cholesterol (Calc): 55 mg/dL (calc)
NON-HDL CHOLESTEROL (CALC): 69 mg/dL (ref <130)
Triglycerides: 68 mg/dL (ref <150)

## 2017-05-18 DIAGNOSIS — J331 Polypoid sinus degeneration: Secondary | ICD-10-CM | POA: Diagnosis not present

## 2017-05-21 ENCOUNTER — Ambulatory Visit
Admission: RE | Admit: 2017-05-21 | Discharge: 2017-05-21 | Disposition: A | Discharge status: Home / Self Care | Payer: Medicare HMO | Source: Ambulatory Visit | Attending: Family Medicine | Admitting: Family Medicine

## 2017-05-21 DIAGNOSIS — Z1239 Encounter for other screening for malignant neoplasm of breast: Secondary | ICD-10-CM

## 2017-05-21 DIAGNOSIS — Z1231 Encounter for screening mammogram for malignant neoplasm of breast: Secondary | ICD-10-CM | POA: Insufficient documentation

## 2017-05-27 DIAGNOSIS — G4733 Obstructive sleep apnea (adult) (pediatric): Secondary | ICD-10-CM | POA: Diagnosis not present

## 2017-06-05 ENCOUNTER — Other Ambulatory Visit: Payer: Self-pay | Admitting: Podiatry

## 2017-06-05 ENCOUNTER — Ambulatory Visit: Payer: Medicare HMO

## 2017-06-05 ENCOUNTER — Encounter: Payer: Self-pay | Admitting: Podiatry

## 2017-06-05 ENCOUNTER — Ambulatory Visit: Payer: Medicare HMO | Admitting: Podiatry

## 2017-06-05 ENCOUNTER — Ambulatory Visit (INDEPENDENT_AMBULATORY_CARE_PROVIDER_SITE_OTHER): Payer: Medicare HMO

## 2017-06-05 DIAGNOSIS — M779 Enthesopathy, unspecified: Principal | ICD-10-CM

## 2017-06-05 DIAGNOSIS — M778 Other enthesopathies, not elsewhere classified: Secondary | ICD-10-CM

## 2017-06-05 DIAGNOSIS — M7751 Other enthesopathy of right foot: Secondary | ICD-10-CM

## 2017-06-05 DIAGNOSIS — M7752 Other enthesopathy of left foot: Secondary | ICD-10-CM | POA: Diagnosis not present

## 2017-06-05 DIAGNOSIS — R52 Pain, unspecified: Secondary | ICD-10-CM

## 2017-06-05 DIAGNOSIS — M722 Plantar fascial fibromatosis: Secondary | ICD-10-CM

## 2017-06-05 NOTE — Progress Notes (Signed)
   Subjective:    Patient ID: Alexandra Foster, female    DOB: [birth year], 67 y.o.   MRN: [id number]  HPI     Review of Systems  All other systems reviewed and are negative.      Objective:   Physical Exam        Assessment & Plan:

## 2017-06-06 DIAGNOSIS — G4733 Obstructive sleep apnea (adult) (pediatric): Secondary | ICD-10-CM | POA: Diagnosis not present

## 2017-06-08 NOTE — Progress Notes (Signed)
   HPI: 67 year old female presenting today with a chief complaint of pain to the dorsal aspect of the right foot that began one week ago. She also reports pain to the left forefoot as well. Walking and bearing weight increase the pain while resting the feet help alleviate it. She has not done anything for treatment. She denies any injury. Patient is here for further evaluation and treatment.   Past Medical History:  Diagnosis Date  . Asthma   . COPD (chronic obstructive pulmonary disease) ([place])   . Endometriosis   . Hypertension   . Sleep apnea    CPAP     Physical Exam: General: The patient is alert and oriented x3 in no acute distress.  Dermatology: Skin is warm, dry and supple bilateral lower extremities. Negative for open lesions or macerations.  Vascular: Palpable pedal pulses bilaterally. No edema or erythema noted. Capillary refill within normal limits.  Neurological: Epicritic and protective threshold grossly intact bilaterally.   Musculoskeletal Exam: Pain with palpation to the dorsal aspect of the right midfoot. Range of motion within normal limits to all pedal and ankle joints bilateral. Muscle strength 5/5 in all groups bilateral.  Fibrous movable mass noted to the plantar medial arch of the left foot consistent with a plantar fibroma.  There is also some pain on palpation along the medial longitudinal arch consistent with a monitor fasciitis likely irritated by the plantar fibroma.  Radiographic Exam:  Normal osseous mineralization. Joint spaces preserved. No fracture/dislocation/boney destruction.    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 X-Rays reviewed.  - Injection of 0.5 mLs Celestone Soluspan injected into the right midfoot. - Injection of 0.5 mLs Celestone Soluspan injected into the fibroma of the left foot. - Recommended good shoe gear. - Recommended OTC Motrin 400 mg.  - Return to clinic as  needed.    Edrick Kins, DPM [facility]  Dr. Edrick Kins, DPM    [address], [place] [address]                Office [phone]  Fax [phone]

## 2017-06-26 ENCOUNTER — Telehealth: Payer: Self-pay | Admitting: Family Medicine

## 2017-06-26 NOTE — Telephone Encounter (Signed)
Refill not appropriate

## 2017-06-26 NOTE — Telephone Encounter (Signed)
Copied from [place]. Topic: Quick Communication - See Telephone Encounter >> Jun 26, 2017  3:05 PM Ether Griffins B wrote: CRM for notification. See Telephone encounter for:  Pt requesting tessalon perles called in. Please send to [facility] [place], [place] 06/26/17.

## 2017-07-04 DIAGNOSIS — G4733 Obstructive sleep apnea (adult) (pediatric): Secondary | ICD-10-CM | POA: Diagnosis not present

## 2017-08-04 DIAGNOSIS — G4733 Obstructive sleep apnea (adult) (pediatric): Secondary | ICD-10-CM | POA: Diagnosis not present

## 2017-08-11 DIAGNOSIS — H2513 Age-related nuclear cataract, bilateral: Secondary | ICD-10-CM | POA: Diagnosis not present

## 2017-08-11 LAB — HM DIABETES EYE EXAM

## 2017-09-03 DIAGNOSIS — G4733 Obstructive sleep apnea (adult) (pediatric): Secondary | ICD-10-CM | POA: Diagnosis not present

## 2017-10-08 ENCOUNTER — Telehealth: Payer: Self-pay | Admitting: Family Medicine

## 2017-10-08 NOTE — Telephone Encounter (Signed)
Please write the letter.  Thank you

## 2017-10-08 NOTE — Telephone Encounter (Signed)
Pt is going out of town on Sunday and is requesting that you please write her a letter stating that she need her cpap machine along with distilled water for medical purposes. (pt is wanting to take it on the plane with her). Pt does have upcoming appointment with you in July.

## 2017-10-27 ENCOUNTER — Other Ambulatory Visit: Payer: Self-pay | Admitting: Family Medicine

## 2017-10-27 DIAGNOSIS — I10 Essential (primary) hypertension: Secondary | ICD-10-CM

## 2017-10-27 DIAGNOSIS — K219 Gastro-esophageal reflux disease without esophagitis: Secondary | ICD-10-CM

## 2017-10-28 NOTE — Telephone Encounter (Signed)
Refill request was sent to Dr. Krichna Sowles for approval and submission.  

## 2017-11-03 ENCOUNTER — Encounter: Payer: Self-pay | Admitting: Family Medicine

## 2017-11-03 ENCOUNTER — Ambulatory Visit (INDEPENDENT_AMBULATORY_CARE_PROVIDER_SITE_OTHER): Payer: Medicare HMO | Admitting: Family Medicine

## 2017-11-03 VITALS — BP 124/76 | HR 78 | Temp 98.2°F | Resp 16 | Ht 65.0 in | Wt 262.4 lb

## 2017-11-03 DIAGNOSIS — Z599 Problem related to housing and economic circumstances, unspecified: Secondary | ICD-10-CM

## 2017-11-03 DIAGNOSIS — E785 Hyperlipidemia, unspecified: Secondary | ICD-10-CM

## 2017-11-03 DIAGNOSIS — D72829 Elevated white blood cell count, unspecified: Secondary | ICD-10-CM | POA: Diagnosis not present

## 2017-11-03 DIAGNOSIS — I6529 Occlusion and stenosis of unspecified carotid artery: Secondary | ICD-10-CM

## 2017-11-03 DIAGNOSIS — J411 Mucopurulent chronic bronchitis: Secondary | ICD-10-CM | POA: Diagnosis not present

## 2017-11-03 DIAGNOSIS — I10 Essential (primary) hypertension: Secondary | ICD-10-CM | POA: Diagnosis not present

## 2017-11-03 DIAGNOSIS — K219 Gastro-esophageal reflux disease without esophagitis: Secondary | ICD-10-CM

## 2017-11-03 DIAGNOSIS — Z598 Other problems related to housing and economic circumstances: Secondary | ICD-10-CM | POA: Diagnosis not present

## 2017-11-03 DIAGNOSIS — M170 Bilateral primary osteoarthritis of knee: Secondary | ICD-10-CM

## 2017-11-03 DIAGNOSIS — E669 Obesity, unspecified: Secondary | ICD-10-CM | POA: Diagnosis not present

## 2017-11-03 DIAGNOSIS — E1169 Type 2 diabetes mellitus with other specified complication: Secondary | ICD-10-CM

## 2017-11-03 DIAGNOSIS — Z79899 Other long term (current) drug therapy: Secondary | ICD-10-CM

## 2017-11-03 MED ORDER — TRIAMTERENE-HCTZ 37.5-25 MG PO CAPS: 1.0000 | ORAL_CAPSULE | Freq: Every day | ORAL | 1 refills | Status: DC

## 2017-11-03 MED ORDER — METFORMIN HCL ER 750 MG PO TB24: 750.0000 mg | ORAL_TABLET | Freq: Every day | ORAL | 1 refills | Status: DC

## 2017-11-03 MED ORDER — ACETAMINOPHEN 500 MG PO TABS: 500.0000 mg | ORAL_TABLET | Freq: Four times a day (QID) | ORAL | 0 refills | Status: DC | PRN

## 2017-11-03 MED ORDER — OMEPRAZOLE 20 MG PO CPDR: 20.0000 mg | DELAYED_RELEASE_CAPSULE | Freq: Every day | ORAL | 1 refills | Status: DC

## 2017-11-03 MED ORDER — ATORVASTATIN CALCIUM 40 MG PO TABS: 40.0000 mg | ORAL_TABLET | Freq: Every day | ORAL | 1 refills | Status: DC

## 2017-11-03 NOTE — Progress Notes (Signed)
Name: Alexandra Foster   MRN: [id number]    DOB: 12-19-1950   Date:11/03/2017       Progress Note  Subjective  Chief Complaint  Chief Complaint  Patient presents with  . Follow-up    patient is here for her 6 months f/u  . Hypertension  . Hyperlipidemia  . COPD  . Obesity    patient would like to talk about some weight loss medication  . Gastroesophageal Reflux  . Sleep Apnea    HPI   HTN: she is compliant with medication. Discussed changing to ARB/ACE but we will only do it if she has proteinuria. No chest pain or palpitation.   Dyslipidemia and carotid artery stenosis: she is taking statin therapy. Reviewed labs and LDL is at goal. No myalgia  OA both knees: she states daily aching pain, no effusion, around 4/10. She has been applying heat and topical medication with help of symptoms.   COPD: doing well, sees Dr. Pryor Ochoa and states no longer on [facility] and she states it really helped with symptoms but she is not able to afford medication. She has daily cough but denies side effects of medication She has an occasional wheeze. The humidity makes symptoms worse this time of the year.   Morbid obesity: she has been obese since reconstructive sinus surgery 11 years ago she had to take steroids and gained weight, unable to lose weight since Weight is stable, lost 2 lbs since last visit.   New onset DM: she had a hgbA1C of 6.6% , she denies polyphagia, polydipsia or polyuria. She likes sweets. Explained regular follow ups, life style modification. She is up to date with eye exam, food exam today, needs to check urine micro.   GERD: discussed risk associated with long term PPI, she is doing well on Omeprazole, no heartburn or indigestion as long as she takes medication   OSA: sees neurologist every 6 months , very compliant with CPAP machine, she wakes feeling rested, no longer snoring at night. Echo showed enlarged right atrium before starting on CPAP. She states  edema controlled with CPAP.    Patient Active Problem List   Diagnosis Date Noted  . Encounter for screening colonoscopy 08/27/2016  . Urge incontinence 03/14/2016  . Osteopenia 02/13/2016  . OSA (obstructive sleep apnea) 01/08/2016  . Allergic rhinitis 04/13/2015  . Arterial vascular disease 09/18/2014  . Carotid artery plaque 09/18/2014  . COPD (chronic obstructive pulmonary disease) ([place]) 09/18/2014  . Morbid obesity ([place]) 09/18/2014  . Esophagitis, reflux 09/18/2014  . Essential hypertension 09/21/2006    Past Surgical History:  Procedure Laterality Date  . COLONOSCOPY  10/29/2006   Dr Allen Norris  . COLONOSCOPY WITH PROPOFOL N/A 11/05/2016   Procedure: COLONOSCOPY WITH PROPOFOL;  Surgeon: Robert Bellow, MD;  Location: [facility] ENDOSCOPY;  Service: Endoscopy;  Laterality: N/A;  . NASAL SINUS SURGERY  2002   Dr Carlis Abbott    Family History  Problem Relation Age of Onset  . Congestive Heart Failure Mother   . Coronary artery disease Father   . Breast cancer Sister 32  . Heart disease Brother   . Varicose Veins Brother   . Alcohol abuse Brother     Social History   Socioeconomic History  . Marital status: Married    Spouse name: Trilby Drummer  . Number of children: 1  . Years of education: 72  . Highest education level: 12th grade  Occupational History    Employer: RETIRED  Social Needs  . Financial  resource strain: Not hard at all  . Food insecurity:    Worry: Never true    Inability: Never true  . Transportation needs:    Medical: No    Non-medical: No  Tobacco Use  . Smoking status: Former Smoker    Packs/day: 2.00    Years: 40.00    Pack years: 80.00    Types: Cigarettes    Last attempt to quit: 2008    Years since quitting: 11.5  . Smokeless tobacco: Former Systems developer    Types: Snuff    Quit date: 04/2001  . Tobacco comment: smoking cessation materials not required  Substance and Sexual Activity  . Alcohol use: No    Alcohol/week: 0.0 oz  . Drug use: No  . Sexual  activity: Never  Lifestyle  . Physical activity:    Days per week: 0 days    Minutes per session: 0 min  . Stress: Not at all  Relationships  . Social connections:    Talks on phone: More than three times a week    Gets together: More than three times a week    Attends religious service: 1 to 4 times per year    Active member of club or organization: No    Attends meetings of clubs or organizations: Never    Relationship status: Married  . Intimate partner violence:    Fear of current or ex partner: No    Emotionally abused: No    Physically abused: No    Forced sexual activity: No  Other Topics Concern  . Not on file  Social History Narrative  . Not on file     Current Outpatient Medications:  .  aspirin 81 MG tablet, Take 1 tablet (81 mg total) by mouth daily., Disp: 30 tablet, Rfl: 0 .  atorvastatin (LIPITOR) 40 MG tablet, Take 1 tablet (40 mg total) by mouth daily., Disp: 90 tablet, Rfl: 1 .  omeprazole (PRILOSEC) 20 MG capsule, TAKE 1 CAPSULE (20 MG TOTAL) BY MOUTH DAILY., Disp: 90 capsule, Rfl: 0 .  triamterene-hydrochlorothiazide (DYAZIDE) 37.5-25 MG capsule, TAKE 1 CAPSULE EVERY DAY, Disp: 90 capsule, Rfl: 0 .  albuterol (PROVENTIL HFA;VENTOLIN HFA) 108 (90 BASE) MCG/ACT inhaler, Inhale into the lungs., Disp: , Rfl:  .  umeclidinium-vilanterol (ANORO ELLIPTA) 62.5-25 MCG/INH AEPB, Inhale 1 puff into the lungs daily. (Patient not taking: Reported on 11/03/2017), Disp: 180 each, Rfl: 1  No Known Allergies   ROS  Constitutional: Negative for fever or weight change.  Respiratory:Positive for morning  cough but no  shortness of breath.   Cardiovascular: Negative for chest pain or palpitations.  Gastrointestinal: Negative for abdominal pain, no bowel changes.  Musculoskeletal: Negative for gait problem or joint swelling. She has joint pain  Skin: Negative for rash.  Neurological: Negative for dizziness or headache.  No other specific complaints in a complete review of  systems (except as listed in HPI above).  Objective  Vitals:   11/03/17 0839  BP: 124/76  Pulse: 78  Resp: 16  Temp: 98.2 F (36.8 C)  TempSrc: Oral  SpO2: 94%  Weight: 262 lb 6.4 oz (119 kg)  Height: 5\' 5"  (1.651 m)    Body mass index is 43.67 kg/m.  Physical Exam  Constitutional: Patient appears well-developed and well-nourished. Obese  No distress.  HEENT: head atraumatic, normocephalic, pupils equal and reactive to light, neck supple, throat within normal limits Cardiovascular: Normal rate, regular rhythm and normal heart sounds.  No murmur heard. Trace  BLE  edema. Pulmonary/Chest: Effort normal and breath sounds normal. No respiratory distress. Abdominal: Soft.  There is no tenderness. Psychiatric: Patient has a normal mood and affect. behavior is normal. Judgment and thought content normal.  Diabetic Foot Exam: Diabetic Foot Exam - Simple   Simple Foot Form Visual Inspection See comments:  Yes Sensation Testing Intact to touch and monofilament testing bilaterally:  Yes Pulse Check Posterior Tibialis and Dorsalis pulse intact bilaterally:  Yes Comments Thick nails     PHQ2/9: Depression screen [facility] 2/9 11/03/2017 04/21/2017 03/14/2016 01/08/2016 04/13/2015  Decreased Interest 0 0 0 0 0  Down, Depressed, Hopeless 0 0 0 0 0  PHQ - 2 Score 0 0 0 0 0  Altered sleeping 0 - - - -  Tired, decreased energy 0 - - - -  Change in appetite 0 - - - -  Feeling bad or failure about yourself  0 - - - -  Trouble concentrating 0 - - - -  Moving slowly or fidgety/restless 0 - - - -  PHQ-9 Score 0 - - - -     Fall Risk: Fall Risk  11/03/2017 04/21/2017 03/14/2016 01/08/2016 04/13/2015  Falls in the past year? No No No No No     Functional Status Survey: Is the patient deaf or have difficulty hearing?: No Does the patient have difficulty seeing, even when wearing glasses/contacts?: No Does the patient have difficulty concentrating, remembering, or making decisions?: No Does  the patient have difficulty walking or climbing stairs?: No Does the patient have difficulty dressing or bathing?: No Does the patient have difficulty doing errands alone such as visiting a doctor's office or shopping?: No    Assessment & Plan  1. Diabetes mellitus type 2 in obese (HCC)  - COMPLETE METABOLIC PANEL WITH GFR - POCT HgB A1C ( error - machine broke)  - Urine Microalbumin w/creat. ratio - Amb ref to Medical Nutrition Therapy-MNT - metFORMIN (GLUCOPHAGE-XR) 750 MG 24 hr tablet; Take 1 tablet (750 mg total) by mouth daily with breakfast.  Dispense: 90 tablet; Refill: 1 - Hemoglobin A1c  2. Leukocytosis, unspecified type  - CBC with Differential/Platelet  3. Essential hypertension  - triamterene-hydrochlorothiazide (DYAZIDE) 37.5-25 MG capsule; Take 1 each (1 capsule total) by mouth daily.  Dispense: 90 capsule; Refill: 1  4. Gastroesophageal reflux disease without esophagitis  - omeprazole (PRILOSEC) 20 MG capsule; Take 1 capsule (20 mg total) by mouth daily.  Dispense: 90 capsule; Refill: 1  5. Dyslipidemia  Continue statin therapy   6. Mucopurulent chronic bronchitis ([place])  We will refer to [facility] for Anoro  7. Morbid obesity ([place])  Discussed with the patient the risk posed by an increased BMI. Discussed importance of portion control, calorie counting and at least 150 minutes of physical activity weekly. Avoid sweet beverages and drink more water. Eat at least 6 servings of fruit and vegetables daily   8. Long-term use of high-risk medication  Check comp panel   9. Carotid artery plaque, unspecified laterality  - atorvastatin (LIPITOR) 40 MG tablet; Take 1 tablet (40 mg total) by mouth daily.  Dispense: 90 tablet; Refill: 1  10. Financial difficulty  - Ambulatory referral to [facility]  11. Primary osteoarthritis of both knees  - acetaminophen (TYLENOL) 500 MG tablet; Take 1 tablet (500 mg total) by mouth every 6 (six) hours as needed.  Dispense: 480  tablet; Refill: 0

## 2017-11-04 LAB — CBC WITH DIFFERENTIAL/PLATELET
Basophils Absolute: 20 cells/uL (ref 0–200)
Basophils Relative: 0.3 %
EOS PCT: 7.4 %
Eosinophils Absolute: 481 cells/uL (ref 15–500)
HCT: 40.6 % (ref 35.0–45.0)
Hemoglobin: 12.7 g/dL (ref 11.7–15.5)
Lymphs Abs: 1157 cells/uL (ref 850–3900)
MCH: 23.9 pg — ABNORMAL LOW (ref 27.0–33.0)
MCHC: 31.3 g/dL — ABNORMAL LOW (ref 32.0–36.0)
MCV: 76.3 fL — AB (ref 80.0–100.0)
MPV: 10.9 fL (ref 7.5–12.5)
Monocytes Relative: 4.9 %
Neutro Abs: 4524 cells/uL (ref 1500–7800)
Neutrophils Relative %: 69.6 %
Platelets: 349 10*3/uL (ref 140–400)
RBC: 5.32 10*6/uL — AB (ref 3.80–5.10)
RDW: 13.1 % (ref 11.0–15.0)
TOTAL LYMPHOCYTE: 17.8 %
WBC: 6.5 10*3/uL (ref 3.8–10.8)
WBCMIX: 319 {cells}/uL (ref 200–950)

## 2017-11-04 LAB — COMPLETE METABOLIC PANEL WITH GFR
AG Ratio: 1.4 (calc) (ref 1.0–2.5)
ALBUMIN MSPROF: 3.9 g/dL (ref 3.6–5.1)
ALKALINE PHOSPHATASE (APISO): 105 U/L (ref 33–130)
ALT: 15 U/L (ref 6–29)
AST: 19 U/L (ref 10–35)
BILIRUBIN TOTAL: 0.5 mg/dL (ref 0.2–1.2)
BUN: 12 mg/dL (ref 7–25)
CHLORIDE: 104 mmol/L (ref 98–110)
CO2: 31 mmol/L (ref 20–32)
Calcium: 9.4 mg/dL (ref 8.6–10.4)
Creat: 0.87 mg/dL (ref 0.50–0.99)
GFR, EST AFRICAN AMERICAN: 79 mL/min/{1.73_m2} (ref >=60)
GFR, Est Non African American: 68 mL/min/{1.73_m2} (ref >=60)
GLUCOSE: 106 mg/dL — AB (ref 65–99)
Globulin: 2.8 g/dL (calc) (ref 1.9–3.7)
Potassium: 3.5 mmol/L (ref 3.5–5.3)
Sodium: 144 mmol/L (ref 135–146)
TOTAL PROTEIN: 6.7 g/dL (ref 6.1–8.1)

## 2017-11-04 LAB — HEMOGLOBIN A1C
EAG (MMOL/L): 7.6 (calc)
Hgb A1c MFr Bld: 6.4 % of total Hgb — ABNORMAL HIGH (ref <5.7)
MEAN PLASMA GLUCOSE: 137 (calc)

## 2017-11-04 LAB — MICROALBUMIN / CREATININE URINE RATIO
Creatinine, Urine: 155 mg/dL (ref 20–275)
MICROALB UR: 0.5 mg/dL
MICROALB/CREAT RATIO: 3 ug/mg{creat} (ref <30)

## 2017-11-17 ENCOUNTER — Encounter: Payer: Self-pay | Admitting: Dietician

## 2017-11-17 ENCOUNTER — Encounter: Payer: Medicare HMO | Attending: Family Medicine | Admitting: Dietician

## 2017-11-17 VITALS — BP 106/74 | Ht 65.0 in | Wt 260.1 lb

## 2017-11-17 DIAGNOSIS — Z713 Dietary counseling and surveillance: Secondary | ICD-10-CM | POA: Insufficient documentation

## 2017-11-17 DIAGNOSIS — E1169 Type 2 diabetes mellitus with other specified complication: Secondary | ICD-10-CM | POA: Diagnosis not present

## 2017-11-17 DIAGNOSIS — E669 Obesity, unspecified: Secondary | ICD-10-CM | POA: Insufficient documentation

## 2017-11-17 DIAGNOSIS — E119 Type 2 diabetes mellitus without complications: Secondary | ICD-10-CM

## 2017-11-17 NOTE — Progress Notes (Signed)
Diabetes Self-Management Education  Visit Type: First/Initial  Appt. Start Time:  1345   Appt. End Time:1500  11/17/2017  Ms. Alverda Skeans, identified by name and date of birth, is a 67 y.o. female with a diagnosis of Diabetes: Type 2.   ASSESSMENT  Blood pressure 106/74, height 5\' 5"  (1.651 m), weight 260 lb 1.6 oz (118 kg). Body mass index is 43.28 kg/m.  Diabetes Self-Management Education - 11/17/17 1513      Visit Information   Visit Type  First/Initial      Initial Visit   Diabetes Type  Type 2      Health Coping   How would you rate your overall health?  Fair      Psychosocial Assessment   Patient Belief/Attitude about Diabetes  Motivated to manage diabetes    Self-care barriers  None    Self-management support  Doctor's office    Other persons present  Patient    Patient Concerns  Weight Control    Special Needs  None    Preferred Learning Style  Auditory    Learning Readiness  Ready    What is the last grade level you completed in school?  12      Pre-Education Assessment   Patient understands the diabetes disease and treatment process.  Needs Instruction    Patient understands incorporating nutritional management into lifestyle.  Needs Instruction    Patient undertands incorporating physical activity into lifestyle.  Needs Instruction    Patient understands using medications safely.  Needs Instruction    Patient understands monitoring blood glucose, interpreting and using results  Needs Instruction    Patient understands prevention, detection, and treatment of acute complications.  Needs Instruction    Patient understands prevention, detection, and treatment of chronic complications.  Needs Instruction    Patient understands how to develop strategies to address psychosocial issues.  Needs Instruction    Patient understands how to develop strategies to promote health/change behavior.  Needs Instruction      Complications   Last HgB A1C per patient/outside  source  6.4 % 11-03-17    Have you had a dilated eye exam in the past 12 months?  Yes 06-2017    Have you had a dental exam in the past 12 months?  Yes 1 year ago    Are you checking your feet?  Yes    How many days per week are you checking your feet?  7      Dietary Intake   Breakfast  eats breakfast at 10a    Snack (morning)  am snack-chips, cookies    Lunch  eats lunch at 2:30p    Snack (afternoon)  PM snack-chips, cookies    Dinner  eats supper at 6:30p-eats fried foods and sweets  2-3x/wk    Snack (evening)  bedtime snack-chips, cookies    Beverage(s)  drinks grape and cranberry juices 1x/day, sweet tea and regular soda 2-3x/day, water 2-3x/day      Exercise   Exercise Type  ADL's limited due to pain in knees due to arthritis      Patient Education   Previous Diabetes Education  No    Disease state   Factors that contribute to the development of diabetes;Definition of diabetes, type 1 and 2, and the diagnosis of diabetes    Nutrition management   Role of diet in the treatment of diabetes and the relationship between the three main macronutrients and blood glucose level;Food label reading, portion sizes and measuring  food.;Carbohydrate counting    Physical activity and exercise   Role of exercise on diabetes management, blood pressure control and cardiac health.;Helped patient identify appropriate exercises in relation to his/her diabetes, diabetes complications and other health issue.    Medications  Reviewed patients medication for diabetes, action, purpose, timing of dose and side effects.    Monitoring  Taught/evaluated SMBG meter.;Purpose and frequency of SMBG.;Yearly dilated eye exam;Identified appropriate SMBG and/or A1C goals.;Taught/discussed recording of test results and interpretation of SMBG. gave pt COntour Next EZ meter and instructed on its use-BG 106 (2 hr pp lunch)    Chronic complications  Relationship between chronic complications and blood glucose control;Retinopathy  and reason for yearly dilated eye exams;Dental care    Personal strategies to promote health  Lifestyle issues that need to be addressed for better diabetes care;Helped patient develop diabetes management plan for (enter comment)      Outcomes   Expected Outcomes  Demonstrated interest in learning. Expect positive outcomes       Individualized Plan for Diabetes Self-Management Training:   Learning Objective:  Patient will have a greater understanding of diabetes self-management. Patient education plan is to attend individual and/or group sessions per assessed needs and concerns.   Plan:   Patient Instructions   Check blood sugars 2 x day before breakfast and 2 hrs after supper every day  Bring blood sugar records to the next appointment/class  Call your doctor for a prescription for:  1. Meter strips (type) Contour Next test strips checking  2 times per day  2. Lancets (type) Microlet lancets checking  2   times per day  Exercise:  Try exercises in Work Out to [employer]  Eat 3 meals day  and a  snack a day at bedtime  Eat 2-3 carbohydrate servings/meal + protein  Eat 1 carbohydrate serving/snack + protein  Space meals 4-5 hours apart  Make healthy food choices  Avoid sugar sweetened drinks (soda, tea, coffee, sports drinks, fruit juices)  Drink plenty of water  Limit intake of fried foods, snack foods and sweets/desserts  Make a dentist  appointment  Get a Sharps container  Return for appointment/classes on:  12-21-17   Expected Outcomes:  Demonstrated interest in learning. Expect positive outcomes  Education material provided: General meal planning guidelines, Contour Next EZ meter, Workout To Go handout  If problems or questions, patient to contact team via:  [phone]  Future DSME appointment:  12-21-17

## 2017-11-17 NOTE — Patient Instructions (Addendum)
  Check blood sugars 2 x day before breakfast and 2 hrs after supper every day  Bring blood sugar records to the next appointment/class  Call your doctor for a prescription for:  1. Meter strips (type) Contour Next test strips checking  2 times per day  2. Lancets (type) Microlet lancets checking  2   times per day  Exercise:  Try exercises in Work Out to [employer]  Eat 3 meals day  And a  snack a day at bedtime  Eat 2-3 carbohydrate servings/meal + protein  Eat 1 carbohydrate serving/snack + protein  Space meals 4-6 hours apart  Make healthy food choices  Avoid sugar sweetened drinks (soda, tea, coffee, sports drinks, fruit juices)  Drink plenty of water  Limit intake of fried foods, snack foods and sweets/desserts  Make a dentist  appointment  Get a Sharps container  Return for appointment/classes on:  12-21-17

## 2017-11-25 ENCOUNTER — Other Ambulatory Visit: Payer: Self-pay | Admitting: Family Medicine

## 2017-11-25 MED ORDER — MICROLET LANCETS MISC: 1.0000 | Freq: Two times a day (BID) | 2 refills | Status: DC

## 2017-11-25 MED ORDER — GLUCOSE BLOOD VI STRP: 1.0000 | ORAL_STRIP | Freq: Two times a day (BID) | 12 refills | Status: DC

## 2017-11-25 NOTE — Telephone Encounter (Signed)
Pt has been to see Ronney Asters ( RN for diabetes) and the RN asked her to let you know that she needs the PCP to call in Contour Next Test Strips ( checking 2 x a day) And Microlet Lancets ( checking 2 x a day).  Pharm is [facility] on [address]

## 2017-11-25 NOTE — Telephone Encounter (Signed)
Refill request was sent to Dr. Bethena Roys. Sowles for approval and submission.

## 2017-12-08 ENCOUNTER — Other Ambulatory Visit: Payer: Self-pay | Admitting: Family Medicine

## 2017-12-08 MED ORDER — ACCU-CHEK AVIVA VI SOLN: 1.0000 | Freq: Every day | 0 refills | Status: DC

## 2017-12-08 MED ORDER — GLUCOSE BLOOD VI STRP: ORAL_STRIP | 12 refills | Status: DC

## 2017-12-08 MED ORDER — ACCU-CHEK SOFT TOUCH LANCETS MISC: 12 refills | Status: DC

## 2017-12-08 NOTE — Telephone Encounter (Signed)
Copied from [place] [phone]. Topic: Quick Communication - Rx Refill/Question >> Dec 08, 2017 10:09 AM Scherrie Gerlach wrote: Medication: glucose blood (CONTOUR NEXT TEST) test strip MICROLET LANCETS MISC  Pt states [employer] never received the RX for any of her supplies to check her sugar.  Pt is out of everything.  Had been reusing the lancets because she did not know. Does not know what to do, because she cannot check her sugar at all. [place], [place] [phone] (Phone) [phone] (Fax)  Pt states she really needs to check her sugar,  Please advise.

## 2017-12-08 NOTE — Telephone Encounter (Signed)
Spoke with [employer] and patient's insurance does not cover Contour. They do however cover Accu-chek or True Metrix please change to preferred meter and testing supplies.

## 2017-12-16 ENCOUNTER — Telehealth: Payer: Self-pay

## 2017-12-16 ENCOUNTER — Ambulatory Visit (INDEPENDENT_AMBULATORY_CARE_PROVIDER_SITE_OTHER): Payer: Medicare HMO

## 2017-12-16 VITALS — BP 120/70 | HR 75 | Ht 65.0 in | Wt 260.1 lb

## 2017-12-16 DIAGNOSIS — R42 Dizziness and giddiness: Secondary | ICD-10-CM | POA: Diagnosis not present

## 2017-12-16 LAB — POCT GLUCOSE (DEVICE FOR HOME USE)
Glucose Fasting, POC: 83 mg/dL (ref 70–99)
POC Glucose: 83 mg/dl (ref 70–99)

## 2017-12-16 NOTE — Telephone Encounter (Signed)
Pt needs Accu-chek aviva plus meter

## 2017-12-16 NOTE — Telephone Encounter (Signed)
Pt states that [employer] has sent 100 test strips and syringes  that do not fit her accu-check. Pt said that she has no way of taking her medication without a new machine. Please advise.

## 2017-12-17 ENCOUNTER — Other Ambulatory Visit: Payer: Self-pay

## 2017-12-17 ENCOUNTER — Other Ambulatory Visit: Payer: Self-pay | Admitting: Nurse Practitioner

## 2017-12-17 MED ORDER — BLOOD GLUCOSE METER KIT: PACK | 0 refills | Status: DC

## 2017-12-17 MED ORDER — ACCU-CHEK AVIVA PLUS W/DEVICE KIT: 1.0000 | PACK | Freq: Every day | 0 refills | Status: DC

## 2017-12-17 NOTE — Addendum Note (Signed)
Addended by: Fredderick Severance on: 12/17/2017 01:40 PM   Modules accepted: Orders

## 2017-12-17 NOTE — Progress Notes (Signed)
Please sign orders for glucose kit and supplies. I signed it once by mistake and it printed and I shredded prescription. Patient states that she got meter lancets and strips but they are all different brands and strips do not fit her machine and needles to not fit into device properly.

## 2017-12-17 NOTE — Telephone Encounter (Signed)
Sent to [facility]

## 2017-12-17 NOTE — Telephone Encounter (Signed)
I contacted [facility] and spoke to the pharmacist, Gerrit Friends, to find out which meter is needed for the supplies that was sent out. She stated that it was the Accu-Chek Aviva Plus Meter. A verbal order was given for the Accu-Chek Aviva plus meter and the order for the [employer]ayer Contour glucometer was discontinued. She stated that it will be sent out priority mail and she should receive it in 5-7 days.

## 2017-12-21 ENCOUNTER — Encounter: Payer: Self-pay | Admitting: Dietician

## 2017-12-21 ENCOUNTER — Ambulatory Visit: Payer: Medicare HMO

## 2017-12-21 NOTE — Progress Notes (Signed)
Patient cancelled her attendance for classes and does not wish to reschedule.

## 2017-12-22 ENCOUNTER — Encounter: Payer: Self-pay | Admitting: Dietician

## 2017-12-23 ENCOUNTER — Encounter: Payer: Self-pay | Admitting: Neurology

## 2017-12-28 ENCOUNTER — Ambulatory Visit: Payer: Medicare HMO

## 2018-01-04 ENCOUNTER — Ambulatory Visit: Payer: Medicare HMO

## 2018-01-13 ENCOUNTER — Other Ambulatory Visit: Payer: Self-pay | Admitting: Family Medicine

## 2018-01-13 DIAGNOSIS — M170 Bilateral primary osteoarthritis of knee: Secondary | ICD-10-CM

## 2018-01-14 ENCOUNTER — Telehealth: Payer: Self-pay

## 2018-01-14 ENCOUNTER — Ambulatory Visit (INDEPENDENT_AMBULATORY_CARE_PROVIDER_SITE_OTHER): Payer: Medicare HMO | Admitting: Family Medicine

## 2018-01-14 VITALS — BP 118/78 | HR 78 | Temp 98.4°F | Resp 16 | Ht 65.0 in | Wt 255.1 lb

## 2018-01-14 DIAGNOSIS — E669 Obesity, unspecified: Secondary | ICD-10-CM | POA: Diagnosis not present

## 2018-01-14 DIAGNOSIS — M170 Bilateral primary osteoarthritis of knee: Secondary | ICD-10-CM | POA: Diagnosis not present

## 2018-01-14 DIAGNOSIS — E1169 Type 2 diabetes mellitus with other specified complication: Secondary | ICD-10-CM | POA: Diagnosis not present

## 2018-01-14 DIAGNOSIS — Z23 Encounter for immunization: Secondary | ICD-10-CM

## 2018-01-14 MED ORDER — LIDOCAINE HCL (PF) 1 % IJ SOLN
2.0000 mL | Freq: Once | INTRAMUSCULAR | Status: AC
  Administered 2018-01-14: 2 mL

## 2018-01-14 MED ORDER — TRIAMCINOLONE ACETONIDE 40 MG/ML IJ SUSP
40.0000 mg | Freq: Once | INTRAMUSCULAR | Status: AC
  Administered 2018-01-14: 40 mg via INTRAMUSCULAR

## 2018-01-14 NOTE — Telephone Encounter (Signed)
We can do that in our office, but I don't mind placing referral if she would prefer that

## 2018-01-14 NOTE — Telephone Encounter (Signed)
Patient is calling and states she is on her way. She will be there in 3 minutes.

## 2018-01-14 NOTE — Telephone Encounter (Signed)
Copied from [place] [phone]. Topic: Referral - Request >> Jan 14, 2018  9:10 AM Hewitt Shorts wrote: Pt was calling for a referral to an orthopedic specialist for knee pain and to consider getting a cortisone shot\ Best number [phone]

## 2018-01-14 NOTE — Progress Notes (Signed)
Name: Alexandra Foster   MRN: [id number]    DOB: 1950/12/10   Date:01/14/2018       Progress Note  Subjective  Chief Complaint  Chief Complaint  Patient presents with  . Knee Pain    Onset-2 weeks ago, constant pain in bilateral knees-has arthritis in knees. Has tried Tylenol, Ibuprofen, pain rub over her knees with no relief.    HPI  OA both knees: she states she started walking about one month ago and had to stop because both knees have been very painful since. She states tylenol helps but pain is getting worse and knees are also stiff when she first starts moving. She states her knees pop, pain now is 8/10. Denies knee instability   Obesity: she lost 6 lbs since last visit with physical activity, advised to resume walking and do some quad exercises at home, also advised to resume water aerobics. Getting stronger and losing weight will help with pain  DM: taking metformin , glucose below 130 , explained cortisone may increase levels and to be strict with her diet   Patient Active Problem List   Diagnosis Date Noted  . Osteoarthritis of both knees 01/14/2018  . Urge incontinence 03/14/2016  . Osteopenia 02/13/2016  . OSA (obstructive sleep apnea) 01/08/2016  . Allergic rhinitis 04/13/2015  . Arterial vascular disease 09/18/2014  . Carotid artery plaque 09/18/2014  . COPD (chronic obstructive pulmonary disease) ([place]) 09/18/2014  . Morbid obesity ([place]) 09/18/2014  . Esophagitis, reflux 09/18/2014  . Essential hypertension 09/21/2006    Past Surgical History:  Procedure Laterality Date  . COLONOSCOPY  10/29/2006   Dr Allen Norris  . COLONOSCOPY WITH PROPOFOL N/A 11/05/2016   Procedure: COLONOSCOPY WITH PROPOFOL;  Surgeon: Robert Bellow, MD;  Location: [facility] ENDOSCOPY;  Service: Endoscopy;  Laterality: N/A;  . NASAL SINUS SURGERY  2002   Dr Carlis Abbott    Family History  Problem Relation Age of Onset  . Congestive Heart Failure Mother   . Coronary artery disease Father   .  Breast cancer Sister 57  . Heart disease Brother   . Varicose Veins Brother   . Alcohol abuse Brother     Social History   Socioeconomic History  . Marital status: Married    Spouse name: Trilby Drummer  . Number of children: 1  . Years of education: 41  . Highest education level: 12th grade  Occupational History    Employer: RETIRED  Social Needs  . Financial resource strain: Not hard at all  . Food insecurity:    Worry: Never true    Inability: Never true  . Transportation needs:    Medical: No    Non-medical: No  Tobacco Use  . Smoking status: Former Smoker    Packs/day: 2.00    Years: 40.00    Pack years: 80.00    Types: Cigarettes    Last attempt to quit: 2008    Years since quitting: 11.7  . Smokeless tobacco: Former Systems developer    Types: Snuff    Quit date: 04/2001  . Tobacco comment: smoking cessation materials not required  Substance and Sexual Activity  . Alcohol use: No    Alcohol/week: 0.0 standard drinks  . Drug use: No  . Sexual activity: Never  Lifestyle  . Physical activity:    Days per week: 0 days    Minutes per session: 0 min  . Stress: Not at all  Relationships  . Social connections:    Talks on phone: More than  three times a week    Gets together: More than three times a week    Attends religious service: 1 to 4 times per year    Active member of club or organization: No    Attends meetings of clubs or organizations: Never    Relationship status: Married  . Intimate partner violence:    Fear of current or ex partner: No    Emotionally abused: No    Physically abused: No    Forced sexual activity: No  Other Topics Concern  . Not on file  Social History Narrative  . Not on file     Current Outpatient Medications:  .  ACETAMINOPHEN EXTRA STRENGTH 500 MG tablet, TAKE 1 TABLET  EVERY 6 (SIX) HOURS AS NEEDED., Disp: 360 tablet, Rfl: 0 .  albuterol (PROVENTIL HFA;VENTOLIN HFA) 108 (90 BASE) MCG/ACT inhaler, Inhale into the lungs., Disp: , Rfl:  .   aspirin 81 MG tablet, Take 1 tablet (81 mg total) by mouth daily., Disp: 30 tablet, Rfl: 0 .  atorvastatin (LIPITOR) 40 MG tablet, Take 1 tablet (40 mg total) by mouth daily., Disp: 90 tablet, Rfl: 1 .  Blood Glucose Calibration (ACCU-CHEK AVIVA) SOLN, 1 each by In Vitro route daily., Disp: 1 each, Rfl: 0 .  blood glucose meter kit and supplies, Dispense based on patient and insurance preference. Use up to four times daily as directed. (FOR ICD-10 E10.9, E11.9)., Disp: 1 each, Rfl: 0 .  blood glucose meter kit and supplies, Dispense based on patient and insurance preference. Use up to four times daily as directed. (FOR ICD-10 E10.9, E11.9)., Disp: 1 each, Rfl: 0 .  Blood Glucose Monitoring Suppl (ACCU-CHEK AVIVA PLUS) w/Device KIT, 1 each by Does not apply route daily., Disp: 1 kit, Rfl: 0 .  glucose blood (ACCU-CHEK AVIVA PLUS) test strip, Use as instructed, Disp: 100 each, Rfl: 12 .  Lancets (ACCU-CHEK SOFT TOUCH) lancets, Use as instructed, Disp: 100 each, Rfl: 12 .  metFORMIN (GLUCOPHAGE-XR) 750 MG 24 hr tablet, Take 1 tablet (750 mg total) by mouth daily with breakfast., Disp: 90 tablet, Rfl: 1 .  montelukast (SINGULAIR) 10 MG tablet, Take 10 mg by mouth at bedtime., Disp: , Rfl:  .  Multiple Vitamins-Minerals (MULTIVITAL PO), Take 1 tablet by mouth daily., Disp: , Rfl:  .  omeprazole (PRILOSEC) 20 MG capsule, Take 1 capsule (20 mg total) by mouth daily., Disp: 90 capsule, Rfl: 1 .  triamterene-hydrochlorothiazide (DYAZIDE) 37.5-25 MG capsule, Take 1 each (1 capsule total) by mouth daily., Disp: 90 capsule, Rfl: 1 .  umeclidinium-vilanterol (ANORO ELLIPTA) 62.5-25 MCG/INH AEPB, Inhale 1 puff into the lungs daily., Disp: 180 each, Rfl: 1  No Known Allergies  I personally reviewed active problem list, medication list, allergies, family history, social history with the patient/caregiver today.   ROS  Constitutional: Negative for fever , positive for mild  weight change.  Respiratory:  Negative for cough and shortness of breath.   Cardiovascular: Negative for chest pain or palpitations.  Gastrointestinal: Negative for abdominal pain, no bowel changes.  Musculoskeletal: positive  for gait problem and intermittent joint swelling.  Skin: Negative for rash.  Neurological: Negative for dizziness or headache.  No other specific complaints in a complete review of systems (except as listed in HPI above).  Objective  Vitals:   01/14/18 1455  BP: 118/78  Pulse: 78  Resp: 16  Temp: 98.4 F (36.9 C)  TempSrc: Oral  SpO2: 96%  Weight: 255 lb 1.6 oz (115.7 kg)  Height: 5' 5" (1.651 m)    Body mass index is 42.45 kg/m.  Physical Exam  Constitutional: Patient appears well-developed and well-nourished. Obese  No distress.  HEENT: head atraumatic, normocephalic, pupils equal and reactive to light, neck supple, throat within normal limits Cardiovascular: Normal rate, regular rhythm and normal heart sounds.  No murmur heard. No BLE edema. Pulmonary/Chest: Effort normal and breath sounds normal. No respiratory distress. Muscular Skeletal: mild knee effusion, no erythema or pain during palpation , crepitus with extension of both knees Abdominal: Soft.  There is no tenderness. Psychiatric: Patient has a normal mood and affect. behavior is normal. Judgment and thought content normal.  Recent Results (from the past 2160 hour(s))  CBC with Differential/Platelet     Status: Abnormal   Collection Time: 11/03/17  9:41 AM  Result Value Ref Range   WBC 6.5 3.8 - 10.8 Thousand/uL   RBC 5.32 (H) 3.80 - 5.10 Million/uL   Hemoglobin 12.7 11.7 - 15.5 g/dL   HCT 40.6 35.0 - 45.0 %   MCV 76.3 (L) 80.0 - 100.0 fL   MCH 23.9 (L) 27.0 - 33.0 pg   MCHC 31.3 (L) 32.0 - 36.0 g/dL   RDW 13.1 11.0 - 15.0 %   Platelets 349 140 - 400 Thousand/uL   MPV 10.9 7.5 - 12.5 fL   Neutro Abs 4,524 1,500 - 7,800 cells/uL   Lymphs Abs 1,157 850 - 3,900 cells/uL   WBC mixed population 319 200 - 950  cells/uL   Eosinophils Absolute 481 15 - 500 cells/uL   Basophils Absolute 20 0 - 200 cells/uL   Neutrophils Relative % 69.6 %   Total Lymphocyte 17.8 %   Monocytes Relative 4.9 %   Eosinophils Relative 7.4 %   Basophils Relative 0.3 %  COMPLETE METABOLIC PANEL WITH GFR     Status: Abnormal   Collection Time: 11/03/17  9:41 AM  Result Value Ref Range   Glucose, Bld 106 (H) 65 - 99 mg/dL    Comment: .            Fasting reference interval . For someone without known diabetes, a glucose value between 100 and 125 mg/dL is consistent with prediabetes and should be confirmed with a follow-up test. .    BUN 12 7 - 25 mg/dL   Creat 0.87 0.50 - 0.99 mg/dL    Comment: For patients >64 years of age, the reference limit for Creatinine is approximately 13% higher for people identified as African-American. .    GFR, Est Non African American 68 > OR = 60 mL/min/1.30m   GFR, Est African American 79 > OR = 60 mL/min/1.749m  BUN/Creatinine Ratio NOT APPLICABLE 6 - 22 (calc)   Sodium 144 135 - 146 mmol/L   Potassium 3.5 3.5 - 5.3 mmol/L   Chloride 104 98 - 110 mmol/L   CO2 31 20 - 32 mmol/L   Calcium 9.4 8.6 - 10.4 mg/dL   Total Protein 6.7 6.1 - 8.1 g/dL   Albumin 3.9 3.6 - 5.1 g/dL   Globulin 2.8 1.9 - 3.7 g/dL (calc)   AG Ratio 1.4 1.0 - 2.5 (calc)   Total Bilirubin 0.5 0.2 - 1.2 mg/dL   Alkaline phosphatase (APISO) 105 33 - 130 U/L   AST 19 10 - 35 U/L   ALT 15 6 - 29 U/L  Urine Microalbumin w/creat. ratio     Status: None   Collection Time: 11/03/17  9:41 AM  Result Value Ref Range  Creatinine, Urine 155 20 - 275 mg/dL   Microalb, Ur 0.5 mg/dL    Comment: Reference Range Not established    Microalb Creat Ratio 3 <30 mcg/mg creat    Comment: . The ADA defines abnormalities in albumin excretion as follows: [name] Category         Result (mcg/mg creatinine) . Normal                    <30 Microalbuminuria         30-299  Clinical albuminuria   > OR = 300 . The ADA  recommends that at least two of three specimens collected within a 3-6 month period be abnormal before considering a patient to be within a diagnostic category.   Hemoglobin A1c     Status: Abnormal   Collection Time: 11/03/17  9:41 AM  Result Value Ref Range   Hgb A1c MFr Bld 6.4 (H) <5.7 % of total Hgb    Comment: For someone without known diabetes, a hemoglobin  A1c value between 5.7% and 6.4% is consistent with prediabetes and should be confirmed with a  follow-up test. . For someone with known diabetes, a value <7% indicates that their diabetes is well controlled. A1c targets should be individualized based on duration of diabetes, age, comorbid conditions, and other considerations. . This assay result is consistent with an increased risk of diabetes. . Currently, no consensus exists regarding use of hemoglobin A1c for diagnosis of diabetes for children. .    Mean Plasma Glucose 137 (calc)   eAG (mmol/L) 7.6 (calc)  POCT Glucose (Device for Home Use)     Status: Normal   Collection Time: 12/16/17  4:40 PM  Result Value Ref Range   Glucose Fasting, POC 83 70 - 99 mg/dL   POC Glucose 83 70 - 99 mg/dl     PHQ2/9: Depression screen [facility] 2/9 01/14/2018 11/17/2017 11/03/2017 04/21/2017 03/14/2016  Decreased Interest 0 0 0 0 0  Down, Depressed, Hopeless 0 0 0 0 0  PHQ - 2 Score 0 0 0 0 0  Altered sleeping - - 0 - -  Tired, decreased energy - - 0 - -  Change in appetite - - 0 - -  Feeling bad or failure about yourself  - - 0 - -  Trouble concentrating - - 0 - -  Moving slowly or fidgety/restless - - 0 - -  PHQ-9 Score - - 0 - -    Fall Risk: Fall Risk  01/14/2018 11/17/2017 11/03/2017 04/21/2017 03/14/2016  Falls in the past year? _0     Functional Status Survey: Is the patient deaf or have difficulty hearing?: No Does the patient have difficulty seeing, even when wearing glasses/contacts?: Yes Does the patient have difficulty concentrating, remembering, or making  decisions?: No Does the patient have difficulty walking or climbing stairs?: Yes(Bilateral Knee Pain) Does the patient have difficulty dressing or bathing?: No Does the patient have difficulty doing errands alone such as visiting a doctor's office or shopping?: No    Assessment & Plan   1. Primary osteoarthritis of both knees   Consent signed: YES  Procedure: Knee Joint Injection Location: right knee Injection approach: lateral knee Equipment used: 25 gauge 1.5 inch needle Medication: 2 mL Kenalog (60m/1mL) Anesthesia: 1% Lidocaine w/o Epinephrine Cleaned and prepped: Betadine  The risks, benefits, treatment options discussed with patient prior to procedure.  Consent signed. Area cleansed with sterile betadine.     Patient tolerated  procedure well with no complications and no bleeding. Bandage placed at site of injection. Patient instructed on potential for steroid reaction pain within the initial 24-48hr period. May use ice packs directly on injected site as needed.  2. Need for immunization against influenza  Refused  3. Morbid obesity ([place])  Discussed with the patient the risk posed by an increased BMI. Discussed importance of portion control, calorie counting and at least 150 minutes of physical activity weekly. Avoid sweet beverages and drink more water. Eat at least 6 servings of fruit and vegetables daily    4. Diabetes mellitus type 2 in obese ([facility])  Diabetes is well controlled at this time, explained that steroid injection may raise glucose levels and she will monitor at this time

## 2018-01-15 ENCOUNTER — Encounter: Payer: Self-pay | Admitting: Family Medicine

## 2018-01-26 ENCOUNTER — Ambulatory Visit: Payer: Medicare HMO | Admitting: Neurology

## 2018-03-09 ENCOUNTER — Ambulatory Visit (INDEPENDENT_AMBULATORY_CARE_PROVIDER_SITE_OTHER): Payer: Medicare HMO | Admitting: Family Medicine

## 2018-03-09 ENCOUNTER — Encounter: Payer: Self-pay | Admitting: Family Medicine

## 2018-03-09 VITALS — BP 118/78 | HR 76 | Temp 98.1°F | Resp 16 | Ht 65.0 in | Wt 256.8 lb

## 2018-03-09 DIAGNOSIS — Z599 Problem related to housing and economic circumstances, unspecified: Secondary | ICD-10-CM

## 2018-03-09 DIAGNOSIS — E669 Obesity, unspecified: Secondary | ICD-10-CM

## 2018-03-09 DIAGNOSIS — I739 Peripheral vascular disease, unspecified: Secondary | ICD-10-CM | POA: Diagnosis not present

## 2018-03-09 DIAGNOSIS — R718 Other abnormality of red blood cells: Secondary | ICD-10-CM

## 2018-03-09 DIAGNOSIS — J411 Mucopurulent chronic bronchitis: Secondary | ICD-10-CM | POA: Diagnosis not present

## 2018-03-09 DIAGNOSIS — E785 Hyperlipidemia, unspecified: Secondary | ICD-10-CM | POA: Diagnosis not present

## 2018-03-09 DIAGNOSIS — M170 Bilateral primary osteoarthritis of knee: Secondary | ICD-10-CM | POA: Diagnosis not present

## 2018-03-09 DIAGNOSIS — I10 Essential (primary) hypertension: Secondary | ICD-10-CM

## 2018-03-09 DIAGNOSIS — I779 Disorder of arteries and arterioles, unspecified: Secondary | ICD-10-CM

## 2018-03-09 DIAGNOSIS — Z598 Other problems related to housing and economic circumstances: Secondary | ICD-10-CM

## 2018-03-09 DIAGNOSIS — E1169 Type 2 diabetes mellitus with other specified complication: Secondary | ICD-10-CM | POA: Diagnosis not present

## 2018-03-09 NOTE — Progress Notes (Signed)
Name: Alexandra Foster   MRN: [id number]    DOB: [birth year]   Date:03/09/2018       Progress Note  Subjective  Chief Complaint  Chief Complaint  Patient presents with  . Medication Refill  . Diabetes    Average-85-90  . Osteoarthritis    Both Knee are worst during the winter times  . Obesity  . Hypertension    Wears compression hoses at night  . Hyperlipidemia    Cramps in her legs at night  . COPD    Denies any symptoms  . Sleep Apnea    HPI  HTN: she is compliant with medication, no proteinuria and is not on ARB or ACE. No chest pain or palpitation.   Dyslipidemia and carotid artery stenosis: she is taking statin therapy. Reviewed labs and LDL is at goal, HDL was low, she has increased fish intake to twice a week, not eating tree nuts yet. No myalgia  COPD: doing well, sees Dr. Pryor Ochoa and states no longer on Symbicort, tried Anoro and she states it really helped with symptoms but she is not able to afford medication, she states she thinks she is on Qvar now, she states all inhalers are too expensive, we will place referral to chronic care management . She denies cough or SOB, she has occasional wheezing. She does not smoke, quit 8 years ago  Morbid obesity: she has been obese since reconstructive sinus surgery 11 years ago she had to take steroids and gained weight. Her weight is stable, she has co-morbidities, knee OA, HTN, dyslipidemia, DM. Discussed life style modification.   DM: she had a hgbA1C of 6.6% January 2019 , she denies polyphagia, polydipsia but she has nocturia.She likes sweets. She went to one class of diabetic teaching class.  She is up to date with eye exam, food exam today, needs to check urine micro. She is on Metformin and denies side effects.   GERD: discussed risk associated with long term PPI, she is doing well on Omeprazole, no heartburn or indigestion as long as she takes medication . Unchanged   OSA: sees neurologist every 6 months , very  compliant with CPAP machine, she wakes feeling rested, no longer snoring at night. Echo showed enlarged right atrium before starting on CPAP. She states edema controlled with CPAP.  Unchanged   OA both knees: she states she started walking during the Summer but stopped because of knee pain. She had steroid injection in our office about 6 weeks ago but only improved the pain for about 2 weeks, discussed referral to Ortho. She states tylenol is helping with symptoms. She states her knees pop, pain now is 2/10. Denies knee instability .   Carotid artery disease: doppler done 2015, discussed repeating test and she is willing to go , on aspirin and statin therapy   Patient Active Problem List   Diagnosis Date Noted  . Osteoarthritis of both knees 01/14/2018  . Urge incontinence 03/14/2016  . Osteopenia 02/13/2016  . OSA (obstructive sleep apnea) 01/08/2016  . Allergic rhinitis 04/13/2015  . Arterial vascular disease 09/18/2014  . Carotid artery plaque 09/18/2014  . COPD (chronic obstructive pulmonary disease) ([place]) 09/18/2014  . Morbid obesity ([place]) 09/18/2014  . Esophagitis, reflux 09/18/2014  . Essential hypertension 09/21/2006    Past Surgical History:  Procedure Laterality Date  . COLONOSCOPY  10/29/2006   Dr Allen Norris  . COLONOSCOPY WITH PROPOFOL N/A 11/05/2016   Procedure: COLONOSCOPY WITH PROPOFOL;  Surgeon: Robert Bellow, MD;  Location: [facility] ENDOSCOPY;  Service: Endoscopy;  Laterality: N/A;  . NASAL SINUS SURGERY  2002   Dr Carlis Abbott    Family History  Problem Relation Age of Onset  . Congestive Heart Failure Mother   . Coronary artery disease Father   . Breast cancer Sister 51  . Heart disease Brother   . Varicose Veins Brother   . Alcohol abuse Brother     Social History   Socioeconomic History  . Marital status: Married    Spouse name: Trilby Drummer  . Number of children: 1  . Years of education: 48  . Highest education level: 12th grade  Occupational History     Employer: RETIRED  Social Needs  . Financial resource strain: Not hard at all  . Food insecurity:    Worry: Never true    Inability: Never true  . Transportation needs:    Medical: No    Non-medical: No  Tobacco Use  . Smoking status: Former Smoker    Packs/day: 2.00    Years: 40.00    Pack years: 80.00    Types: Cigarettes    Last attempt to quit: 2008    Years since quitting: 11.9  . Smokeless tobacco: Former Systems developer    Types: Snuff    Quit date: 04/2001  . Tobacco comment: smoking cessation materials not required  Substance and Sexual Activity  . Alcohol use: No    Alcohol/week: 0.0 standard drinks  . Drug use: No  . Sexual activity: Never  Lifestyle  . Physical activity:    Days per week: 0 days    Minutes per session: 0 min  . Stress: Not at all  Relationships  . Social connections:    Talks on phone: More than three times a week    Gets together: More than three times a week    Attends religious service: 1 to 4 times per year    Active member of club or organization: No    Attends meetings of clubs or organizations: Never    Relationship status: Married  . Intimate partner violence:    Fear of current or ex partner: No    Emotionally abused: No    Physically abused: No    Forced sexual activity: No  Other Topics Concern  . Not on file  Social History Narrative  . Not on file     Current Outpatient Medications:  .  ACETAMINOPHEN EXTRA STRENGTH 500 MG tablet, TAKE 1 TABLET  EVERY 6 (SIX) HOURS AS NEEDED., Disp: 360 tablet, Rfl: 0 .  albuterol (PROVENTIL HFA;VENTOLIN HFA) 108 (90 BASE) MCG/ACT inhaler, Inhale into the lungs., Disp: , Rfl:  .  aspirin 81 MG tablet, Take 1 tablet (81 mg total) by mouth daily., Disp: 30 tablet, Rfl: 0 .  atorvastatin (LIPITOR) 40 MG tablet, Take 1 tablet (40 mg total) by mouth daily., Disp: 90 tablet, Rfl: 1 .  Blood Glucose Calibration (ACCU-CHEK AVIVA) SOLN, 1 each by In Vitro route daily., Disp: 1 each, Rfl: 0 .  blood glucose  meter kit and supplies, Dispense based on patient and insurance preference. Use up to four times daily as directed. (FOR ICD-10 E10.9, E11.9)., Disp: 1 each, Rfl: 0 .  blood glucose meter kit and supplies, Dispense based on patient and insurance preference. Use up to four times daily as directed. (FOR ICD-10 E10.9, E11.9)., Disp: 1 each, Rfl: 0 .  Blood Glucose Monitoring Suppl (ACCU-CHEK AVIVA PLUS) w/Device KIT, 1 each by Does not apply route daily., Disp:  1 kit, Rfl: 0 .  glucose blood (ACCU-CHEK AVIVA PLUS) test strip, Use as instructed, Disp: 100 each, Rfl: 12 .  Lancets (ACCU-CHEK SOFT TOUCH) lancets, Use as instructed, Disp: 100 each, Rfl: 12 .  metFORMIN (GLUCOPHAGE-XR) 750 MG 24 hr tablet, Take 1 tablet (750 mg total) by mouth daily with breakfast., Disp: 90 tablet, Rfl: 1 .  montelukast (SINGULAIR) 10 MG tablet, Take 10 mg by mouth at bedtime., Disp: , Rfl:  .  Multiple Vitamins-Minerals (MULTIVITAL PO), Take 1 tablet by mouth daily., Disp: , Rfl:  .  omeprazole (PRILOSEC) 20 MG capsule, Take 1 capsule (20 mg total) by mouth daily., Disp: 90 capsule, Rfl: 1 .  triamterene-hydrochlorothiazide (DYAZIDE) 37.5-25 MG capsule, Take 1 each (1 capsule total) by mouth daily., Disp: 90 capsule, Rfl: 1 .  umeclidinium-vilanterol (ANORO ELLIPTA) 62.5-25 MCG/INH AEPB, Inhale 1 puff into the lungs daily., Disp: 180 each, Rfl: 1  No Known Allergies  I personally reviewed active problem list, medication list, allergies, family history, social history with the patient/caregiver today.   ROS  Constitutional: Negative for fever or weight change.  Respiratory: Negative for cough and shortness of breath.   Cardiovascular: Negative for chest pain or palpitations.  Gastrointestinal: Negative for abdominal pain, no bowel changes.  Musculoskeletal: Positive  for gait problem and intermittent  joint swelling.  Skin: Negative for rash.  Neurological: Negative for dizziness or headache.  No other specific  complaints in a complete review of systems (except as listed in HPI above).  Objective  Vitals:   03/09/18 0828  BP: 118/78  Pulse: 76  Resp: 16  Temp: 98.1 F (36.7 C)  TempSrc: Oral  SpO2: 96%  Weight: 256 lb 12.8 oz (116.5 kg)  Height: '5\' 5"'  (1.651 m)    Body mass index is 42.73 kg/m.  Physical Exam  Constitutional: Patient appears well-developed and well-nourished. Obese  No distress.  HEENT: head atraumatic, normocephalic, pupils equal and reactive to light, neck supple, throat within normal limits Cardiovascular: Normal rate, regular rhythm and normal heart sounds.  No murmur heard. No BLE edema. Pulmonary/Chest: Effort normal and breath sounds normal. No respiratory distress. Abdominal: Soft.  There is no tenderness. Psychiatric: Patient has a normal mood and affect. behavior is normal. Judgment and thought content normal.  Recent Results (from the past 2160 hour(s))  POCT Glucose (Device for Home Use)     Status: Normal   Collection Time: 12/16/17  4:40 PM  Result Value Ref Range   Glucose Fasting, POC 83 70 - 99 mg/dL   POC Glucose 83 70 - 99 mg/dl    Diabetic Foot Exam: Diabetic Foot Exam - Simple   Simple Foot Form Diabetic Foot exam was performed with the following findings:  Yes 03/09/2018  9:00 AM  Visual Inspection See comments:  Yes Sensation Testing Intact to touch and monofilament testing bilaterally:  Yes Pulse Check Posterior Tibialis and Dorsalis pulse intact bilaterally:  Yes Comments Dry skin , some callus formation, thick toenails.       PHQ2/9: Depression screen [facility] 2/9 03/09/2018 01/14/2018 11/17/2017 11/03/2017 04/21/2017  Decreased Interest 2 0 0 0 0  Down, Depressed, Hopeless 0 0 0 0 0  PHQ - 2 Score 2 0 0 0 0  Altered sleeping 0 - - 0 -  Tired, decreased energy 2 - - 0 -  Change in appetite 0 - - 0 -  Feeling bad or failure about yourself  0 - - 0 -  Trouble concentrating 0 - -  0 -  Moving slowly or fidgety/restless 0 - - 0 -   Suicidal thoughts 0 - - - -  PHQ-9 Score 4 - - 0 -  Difficult doing work/chores Not difficult at all - - - -     Fall Risk: Fall Risk  01/14/2018 11/17/2017 11/03/2017 04/21/2017 03/14/2016  Falls in the past year? No No No No No     Functional Status Survey: Is the patient deaf or have difficulty hearing?: No Does the patient have difficulty seeing, even when wearing glasses/contacts?: Yes Does the patient have difficulty concentrating, remembering, or making decisions?: No Does the patient have difficulty walking or climbing stairs?: Yes Does the patient have difficulty dressing or bathing?: No Does the patient have difficulty doing errands alone such as visiting a doctor's office or shopping?: No    Assessment & Plan  1. Diabetes mellitus type 2 in obese (HCC)  - Hemoglobin A1c  2. Abnormal red blood cells  - CBC with Differential/Platelet - Iron, TIBC and Ferritin Panel  3. Mucopurulent chronic bronchitis (HCC)  Needs Anoro but cannot afford, we will refer to chronic care management   4. Morbid obesity ([place])  Discussed with the patient the risk posed by an increased BMI. Discussed importance of portion control, calorie counting and at least 150 minutes of physical activity weekly. Avoid sweet beverages and drink more water. Eat at least 6 servings of fruit and vegetables daily   5. Essential hypertension  At goal   6. Dyslipidemia   7. Dyslipidemia associated with type 2 diabetes mellitus (HCC)  - Lipid panel  8. Bilateral carotid artery disease, unspecified type ([place])  Continue aspirin and statin Referral vascular study   9. Financial difficulty  - Ambulatory referral to Chronic Care Management Services  10. Primary osteoarthritis of both knees  - Ambulatory referral to Orthopedic Surgery

## 2018-03-18 DIAGNOSIS — M170 Bilateral primary osteoarthritis of knee: Secondary | ICD-10-CM | POA: Diagnosis not present

## 2018-03-19 ENCOUNTER — Ambulatory Visit: Payer: Self-pay

## 2018-03-19 DIAGNOSIS — Z598 Other problems related to housing and economic circumstances: Secondary | ICD-10-CM

## 2018-03-19 DIAGNOSIS — I10 Essential (primary) hypertension: Secondary | ICD-10-CM

## 2018-03-19 DIAGNOSIS — Z599 Problem related to housing and economic circumstances, unspecified: Secondary | ICD-10-CM

## 2018-03-19 DIAGNOSIS — E1169 Type 2 diabetes mellitus with other specified complication: Secondary | ICD-10-CM

## 2018-03-19 DIAGNOSIS — E669 Obesity, unspecified: Principal | ICD-10-CM

## 2018-03-19 NOTE — Chronic Care Management (AMB) (Addendum)
    Chronic Care Management Note    67 y.o. year old female referred to Chronic Care Management by Dr. Steele Sizer for chronic care management and care coordination related to her financial difficulties. Chronic conditions include HTN, COPD, Morbid Obesity and Arterial Vascular Disease. Last office visit with Steele Sizer, MD was 03/09/18.   Was unable to reach patient via telephone today for introduction of services provided by the CCM Team. I have left HIPAA compliant message with her husband asking patient to return my call. (unsuccessful outreach #[phone]).   Addendum: At 4:52 CCM RN CM received an incoming phone call from patient returning call. Ms.Mcneish indicates that she is not taking her medications as prescribed because she cannot afford her inhalers.  Ms. Brodbeck was given information about Chronic Care Management services today including:  1. CCM service includes personalized support from designated clinical staff supervised by her physician, including individualized plan of care and coordination with other care providers 2. 24/7 contact phone numbers for assistance for urgent and routine care needs. 3. Service will only be billed when office clinical staff spend 20 minutes or more in a month to coordinate care. 4. Only one practitioner may furnish and bill the service in a calendar month. 5. The patient may stop CCM services at any time (effective at the end of the month) by phone call to the office staff. 6. The patient will be responsible for cost sharing (co-pay) of up to 20% of the service fee (after annual deductible is met).  Patient agreed to services and verbal consent obtained.   Plan: The CCM Team will meet with Ms. Mullan in the office for her initial visit on 03/30/18 at 1:30     Marge Vandermeulen E. Rollene Rotunda, RN, BSN Nurse Care Coordinator [facility] Care Management  [phone]

## 2018-03-19 NOTE — Patient Instructions (Signed)
1. Thank You for allowing the CCM (Chronic Care Management) Team to assist you with your healthcare goals!! We look forward to meeting you on 03/30/18 at 1:30pm. 2. Please bring all your medications and glucometer with you to your appointment 2. Contact the CCM Team if you have any question or need to reschedule your initial visit.  CCM (Chronic Care Management) Team   Trish Fountain RN, BSN Nurse Care Coordinator  [phone]  Ruben Reason PharmD  Clinical Pharmacist  [phone]   Alexandra Foster was given information about Chronic Care Management services today including:  1. CCM service includes personalized support from designated clinical staff supervised by her physician, including individualized plan of care and coordination with other care providers 2. 24/7 contact phone numbers for assistance for urgent and routine care needs. 3. Service will only be billed when office clinical staff spend 20 minutes or more in a month to coordinate care. 4. Only one practitioner may furnish and bill the service in a calendar month. 5. The patient may stop CCM services at any time (effective at the end of the month) by phone call to the office staff. 6. The patient will be responsible for cost sharing (co-pay) of up to 20% of the service fee (after annual deductible is met).  Patient agreed to services and verbal consent obtained.

## 2018-03-30 ENCOUNTER — Ambulatory Visit (INDEPENDENT_AMBULATORY_CARE_PROVIDER_SITE_OTHER): Payer: Medicare HMO | Admitting: Pharmacist

## 2018-03-30 ENCOUNTER — Ambulatory Visit: Payer: Self-pay

## 2018-03-30 DIAGNOSIS — I10 Essential (primary) hypertension: Secondary | ICD-10-CM | POA: Diagnosis not present

## 2018-03-30 DIAGNOSIS — J411 Mucopurulent chronic bronchitis: Secondary | ICD-10-CM | POA: Diagnosis not present

## 2018-03-30 DIAGNOSIS — E669 Obesity, unspecified: Secondary | ICD-10-CM

## 2018-03-30 DIAGNOSIS — E1169 Type 2 diabetes mellitus with other specified complication: Secondary | ICD-10-CM | POA: Diagnosis not present

## 2018-03-30 NOTE — Chronic Care Management (AMB) (Signed)
Chronic Care Management   Initial Visit Note  03/30/2018 Name: Alexandra Foster MRN: [id number] DOB: 1951-04-03  Referred by: Steele Sizer, MD Reason for referral : Chronic Care Management (financial difficulties)   Subjective: "Working on my breathing is the most important thing you can help me with right now"  Objective:   Assessment: 67 y.o. year old female referred to Chronic Care Management by Dr. Steele Sizer for chronic care management and care coordination related to her financial difficulties. Chronic conditions include HTN, COPD, Morbid Obesity and Arterial Vascular Disease. Last office visit with Steele Sizer, MD was 03/09/18. The CCM Team met with [name] today in the office to establish a plan of care related to her current self stated goals.  Goals Addressed    . "I stay constipated and my mouth stays dry" (pt-stated)       Pharmacist Clinical Goal(s): Over the next 30 days, patient will verbalize understanding of constipation care plan as evidenced by patient report  Interventions:  -CCM pharmacist will review medication list for medications with anticholinergic side effects -CCM pharmacist will recommend a constipation bowel regimen     . "I want to work on my breathing" (pt-stated)       [name] admits to exertional shortness of breath with daily activities such as vacuuming. Although there are no stairs in her home, she feels she would have difficulty. 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 She is not exposed to cleaning materials at work and she no longer uses harsh cleaning products at home. She understands that her weight may play a part in her shortness of breath and is interested in working on weight loss after the first of the year. She is not taking any of her prescribed inhaled medications for her COPD related to cost. [facility] pharmacist will assist with medication cost reduction.  Clinical Goals: (1)Over the next 14 days,  patient will verbalize utilization of purse lip breathing for relief from exertional shortness of breath. (2) Over the next 14 days, patient will verbalize knowledge of COPD yellow zone and actions to take.  Interventions: Basic COPD education provided. Pursed lip breathing with teach back and return demonstration, provided handout via AVS on Pursed lip breathing, provided patient with [facility] Calendar and reinforced COPD action plan    . "My copay for one of my medicines was $400" (pt-stated)       Clinical Goal(s): Over the next 10 days, [name] will provide the necessary supplementary documents (proof of out of pocket prescription expenditure, proof of household income) needed for medication assistance applications to CCM pharmacist.   Interventions:  CCM pharmacist will apply for medication assistance programs for inhalers and DM medications for 2020.        Plan: will follow up with patient in 2 weeks for ongoing assessment and to assess progression towards goals established during today's visit   [name] was given information about Chronic Care Management services today including:  1. CCM service includes personalized support from designated clinical staff supervised by her physician, including individualized plan of care and coordination with other care providers 2. 24/7 contact phone numbers for assistance for urgent and routine care needs. 3. Service will only be billed when office clinical staff spend 20 minutes or more in a month to coordinate care. 4. Only one practitioner may furnish and bill the service in a calendar month. 5. The patient may stop CCM services at any time (effective at the  Chronic Care Management   Initial Visit Note  03/30/2018 Name: Alexandra Foster MRN: [id number] DOB: 1951-04-03  Referred by: Steele Sizer, MD Reason for referral : Chronic Care Management (financial difficulties)   Subjective: "Working on my breathing is the most important thing you can help me with right now"  Objective:   Assessment: 67 y.o. year old female referred to Chronic Care Management by Dr. Steele Sizer for chronic care management and care coordination related to her financial difficulties. Chronic conditions include HTN, COPD, Morbid Obesity and Arterial Vascular Disease. Last office visit with Steele Sizer, MD was 03/09/18. The CCM Team met with [name] today in the office to establish a plan of care related to her current self stated goals.  Goals Addressed    . "I stay constipated and my mouth stays dry" (pt-stated)       Pharmacist Clinical Goal(s): Over the next 30 days, patient will verbalize understanding of constipation care plan as evidenced by patient report  Interventions:  -CCM pharmacist will review medication list for medications with anticholinergic side effects -CCM pharmacist will recommend a constipation bowel regimen     . "I want to work on my breathing" (pt-stated)       [name] admits to exertional shortness of breath with daily activities such as vacuuming. Although there are no stairs in her home, she feels she would have difficulty. 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 She is not exposed to cleaning materials at work and she no longer uses harsh cleaning products at home. She understands that her weight may play a part in her shortness of breath and is interested in working on weight loss after the first of the year. She is not taking any of her prescribed inhaled medications for her COPD related to cost. [facility] pharmacist will assist with medication cost reduction.  Clinical Goals: (1)Over the next 14 days,  patient will verbalize utilization of purse lip breathing for relief from exertional shortness of breath. (2) Over the next 14 days, patient will verbalize knowledge of COPD yellow zone and actions to take.  Interventions: Basic COPD education provided. Pursed lip breathing with teach back and return demonstration, provided handout via AVS on Pursed lip breathing, provided patient with [facility] Calendar and reinforced COPD action plan    . "My copay for one of my medicines was $400" (pt-stated)       Clinical Goal(s): Over the next 10 days, [name] will provide the necessary supplementary documents (proof of out of pocket prescription expenditure, proof of household income) needed for medication assistance applications to CCM pharmacist.   Interventions:  CCM pharmacist will apply for medication assistance programs for inhalers and DM medications for 2020.        Plan: will follow up with patient in 2 weeks for ongoing assessment and to assess progression towards goals established during today's visit   [name] was given information about Chronic Care Management services today including:  1. CCM service includes personalized support from designated clinical staff supervised by her physician, including individualized plan of care and coordination with other care providers 2. 24/7 contact phone numbers for assistance for urgent and routine care needs. 3. Service will only be billed when office clinical staff spend 20 minutes or more in a month to coordinate care. 4. Only one practitioner may furnish and bill the service in a calendar month. 5. The patient may stop CCM services at any time (effective at the

## 2018-03-30 NOTE — Chronic Care Management (AMB) (Signed)
Chronic Care Management   Note  03/30/2018 Name: Alexandra Foster MRN: [id number] DOB: [birth year]   Subjective:   Does the patient  feel that his/her medications are working for him/her?  "I'm still constipated a lot. I'm having breathing trouble because I can't afford my inhalers"  Has the patient been experiencing any side effects to the medications prescribed?  no  Does the patient measure his/her own blood glucose at home?  yes   Does the patient measure his/her own blood pressure at home? no   Does the patient have any problems obtaining medications due to transportation or finances?   yes  Understanding of regimen: fair Understanding of indications: good Potential of compliance: poor - doesn't have inhalers due to financial strain  Objective: Lab Results  Component Value Date   CREATININE 0.87 11/03/2017   CREATININE 0.89 05/13/2017   CREATININE 0.95 01/08/2016    Lab Results  Component Value Date   HGBA1C 6.4 (H) 11/03/2017    Lipid Panel     Component Value Date/Time   CHOL 117 05/13/2017 0900   TRIG 68 05/13/2017 0900   HDL 48 (L) 05/13/2017 0900   CHOLHDL 2.4 05/13/2017 0900   VLDL 22 01/08/2016 1543   LDLCALC 55 05/13/2017 0900    BP Readings from Last 3 Encounters:  03/09/18 118/78  01/14/18 118/78  12/16/17 120/70    No Known Allergies  Medications Reviewed Today    Reviewed by Cathi Roan, [facility] (Pharmacist) on 03/30/18 at 1619  Med List Status: <None>  Medication Order Taking? Sig Documenting Provider Last Dose Status Informant  albuterol (PROVENTIL HFA;VENTOLIN HFA) 108 (90 BASE) MCG/ACT inhaler [id number] No Inhale into the lungs. [provider] Not Taking Active            Med Note [name], LATISHA A   Tue Nov 03, 2017  8:44 AM) Patient cannot afford this medication.   aspirin 81 MG tablet [id number] Yes Take 1 tablet (81 mg total) by mouth daily. Steele Sizer, MD Taking Active   atorvastatin (LIPITOR) 40 MG tablet  [id number] Yes Take 1 tablet (40 mg total) by mouth daily. Steele Sizer, MD Taking Active   Blood Glucose Calibration ([place]) SOLN [id number] Yes 1 each by In Vitro route daily. Steele Sizer, MD Taking Active   Blood Glucose Monitoring Suppl (ACCU-CHEK AVIVA PLUS) w/Device KIT [id number] Yes 1 each by Does not apply route daily. Steele Sizer, MD Taking Active   calcium carbonate (OSCAL) 1500 (600 Ca) MG TABS tablet [id number] Yes Take 600 mg of elemental calcium by mouth 2 (two) times daily with a meal. [provider] Taking Active   docusate sodium (COLACE) 100 MG capsule [id number] Yes Take 100 mg by mouth daily. [provider] Taking Active            Med Note Kary Kos, Blair Heys   Tue Mar 30, 2018  4:19 PM) "I'm still constipated a lot"   glucose blood (ACCU-CHEK AVIVA PLUS) test strip [id number] Yes Use as instructed Steele Sizer, MD Taking Active   Lancets (ACCU-CHEK SOFT TOUCH) lancets [id number] Yes Use as instructed Steele Sizer, MD Taking Active   metFORMIN (GLUCOPHAGE-XR) 750 MG 24 hr tablet [id number] Yes Take 1 tablet (750 mg total) by mouth daily with breakfast. Steele Sizer, MD Taking Active   montelukast (SINGULAIR) 10 MG tablet [id number] Yes Take 10 mg by mouth at bedtime. [provider] Taking Active   Multiple Vitamins-Minerals (MULTIVITAL PO) [id number] Yes Take 1 tablet by  mouth daily. [provider] Taking Active   omeprazole (PRILOSEC) 20 MG capsule [id number] Yes Take 1 capsule (20 mg total) by mouth daily. Steele Sizer, MD Taking Active   triamterene-hydrochlorothiazide (DYAZIDE) 37.5-25 MG capsule [id number] Yes Take 1 each (1 capsule total) by mouth daily. Steele Sizer, MD Taking Active   umeclidinium-vilanterol [facility] ELLIPTA) 62.5-25 MCG/INH AEPB [id number] No Inhale 1 puff into the lungs daily.  Patient not taking:  Reported on 03/30/2018   Steele Sizer, MD Not Taking Active            Med Note [name],  LATISHA A   Tue Nov 03, 2017  8:44 AM) Patient is not able to afford this medication.  vitamin B-12 (CYANOCOBALAMIN) 100 MCG tablet [id number] Yes Take 100 mcg by mouth daily. [provider] Taking Active          Assessment:   Total Number of meds:  9 (polypharmacy > 10 meds)  Indications for all medications: _0  Yes       _1  No  Adherence Review  _2  Excellent (no doses missed/week)     _3  Good (no more than 1 dose missed/week)     _4  Partial (2-3 doses missed/week)     _5  Poor (>3 doses missed/week)  Intervention  YES NO  Explanation   Safety/Adverse Med Event      Contraindication present _6  _7     Unsafe medication _8  _9     Allergic reaction _10  _11     Drug interaction _12  _13     Excessive dose/duration _14  _15     Undesirable side effect _16  _17  Constipation, dry mouth   Adherence      Cannot afford medication _18  _19  Inhalers, medication for a fungus from podiatrist    Cannot self-administer medication appropriately _20  _21     Does not understand directions _22  _23     Prefers not to take _24  _25     Product unavailable _26  _27     Forgets to take _28  _29     Other pertinent pharmacist  counseling      Goals Addressed            This Visit's Progress   . "I stay constipated and my mouth stays dry" (pt-stated)       Pharmacist Clinical Goal(s): Over the next 30 days, patient will verbalize understanding of constipation care plan as evidenced by patient report  Interventions:  -CCM pharmacist will review medication list for medications with anticholinergic side effects -CCM pharmacist will recommend a constipation bowel regimen     . "I want to work on my breathing" (pt-stated)        Clinical Goals: Over the next 14 days, patient will verbalize utilization of purse lip breathing for relief from exertional shortness of breath.  Interventions: Basic COPD education provided. Pursed lip breathing with teach back and return demonstration, provided handout via AVS on Pursed lip  breathing, provided patient with [facility] Calendar and reinforced COPD action plan    . "My copay for one of my medicines was $400" (pt-stated)       Clinical Goal(s): Over the next 10 days, Ms. Oberholzer will provide the necessary supplementary documents (proof of out of pocket prescription expenditure, proof of household income) needed for medication assistance applications to CCM pharmacist.   Interventions:  CCM pharmacist will apply for medication assistance programs for inhalers and DM medications for 2020.         Time:  Time spent counseling patient: 75 minutes Additional time spent on charting: 20  minutes  Plan: Recommendations discussed with patient:  - Pharmacist and patient will apply for assistance for inhalers for the 2020 cycle - Pharmacist will investigate medication prescribed by podiatrist some time ago for foot fungus   Follow up in 2 weeks  Ruben Reason, PharmD Clinical Pharmacist [place] [phone]

## 2018-03-30 NOTE — Patient Instructions (Addendum)
1. Almyra Free (pharmacist) will make an appointment with you for 2020 medication assistance applications. Please gather necessary documents in preparation for appointment.  2. Please continue to take all your oral medications as prescribed. 3. Practice pursed lip breathing when you are feeling stressed or when you have a moment (example: commercial breaks during a favorite TV show). This will really help improve your shortness of breath! Remember to "smell the roses, and blow out the candles" 4. Contact Portia (nurse) or the [place] office if you feel like it might be a "yellow zone" day for your breathing     Ms. Bloxham was given information about Chronic Care Management services today including:  1. CCM service includes personalized support from designated clinical staff supervised by her physician, including individualized plan of care and coordination with other care providers 2. 24/7 contact phone numbers for assistance for urgent and routine care needs. 3. Service will only be billed when office clinical staff spend 20 minutes or more in a month to coordinate care. 4. Only one practitioner may furnish and bill the service in a calendar month. 5. The patient may stop CCM services at any time (effective at the end of the month) by phone call to the office staff. 6. The patient will be responsible for cost sharing (co-pay) of up to 20% of the service fee (after annual deductible is met).  Patient agreed to services and verbal consent obtained.  Please call a member of the CCM (Chronic Care Management) Team with any questions or case management needs:   Vanetta Mulders, BSN Nurse Care Coordinator  [phone]  Ruben Reason, PharmD  Clinical Pharmacist  [phone]  Pursed Lip Breathing Pursed lip breathing is a technique to relieve the feeling of being short of breath. Some long-term respiratory conditions, like chronic obstructive pulmonary disease (COPD) and severe asthma, can  make it hard to breathe out (exhale) all of the air in your lungs. This can make air that has less oxygen than normal build up in your lungs (air trapping). Trapped air means your lungs fill with less fresh air when you breathe in (inhale). As a result, you feel short of breath.  Pursed lip breathing keeps your airways open longer when you exhale and empties more air from your lungs. This makes more space for fresh air when you inhale. Pursed lip breathing can also slow down your breathing and help your body not have to work so hard to breathe. Over time, pursed lip breathing may help you be able to be more physically active and do more activities. How to perform pursed lip breathing Being short of breath can make you tense and anxious. Before you start this breathing exercise, take a minute to relax your shoulders and close your eyes. Then: 1. Start the exercise by closing your mouth. 2. Breathe in through your nose, taking a normal breath. You can do this at your normal rate of breathing. If you feel you are not getting enough air, breathe in while slowly counting to 2 or 3. 3. Pucker (purse) your lips as if you were going to whistle. 4. Gently tighten your abdomen muscles or press on your belly to help push the air out. 5. Breathe out slowly through your pursed lips. Take at least twice as long to breathe out as it takes you to breathe in. 6. Make sure that you breathe out all of the air, but do not force air out. 7. Repeat the exercise until your breathing  improves. Ask your health care provider how often and how long to do this exercise.  Follow these instructions at home:  Take over-the-counter and prescription medicines only as told by your health care provider.  Return to your normal activities as told by your health care provider. Ask your health care provider what activities are safe for you.  Do not use any products that contain nicotine or tobacco, such as cigarettes and e-cigarettes.  If you need help quitting, ask your health care provider.  Keep all follow-up visits as told by your health care provider. This is important. Contact a health care provider if:  Your shortness of breath gets worse.  You become less able to exercise or be active.  You develop a cough.  You develop a fever. Get help right away if:  You are struggling to breathe.  Your shortness of breath prevents you from engaging in any activity. Summary  Pursed lip breathing is a breathing technique that helps to remove trapped air from your lungs. It helps you get more oxygen into your lungs and makes your body have to work less hard to breathe.  Pursed lip breathing can gradually make you more able to be physically active.  You can do pursed lip breathing on your own at home.  Ask your health care provider how often and how long you should do pursed lip breathing. This information is not intended to replace advice given to you by your health care provider. Make sure you discuss any questions you have with your health care provider.   Goals Addressed            This Visit's Progress   . "I stay constipated and my mouth stays dry" (pt-stated)       Pharmacist Clinical Goal(s): Over the next 30 days, patient will verbalize understanding of constipation care plan as evidenced by patient report  Interventions:  -CCM pharmacist will review medication list for medications with anticholinergic side effects -CCM pharmacist will recommend a constipation bowel regimen     . "I want to work on my breathing" (pt-stated)        Clinical Goals: Over the next 14 days, patient will verbalize utilization of purse lip breathing for relief from exertional shortness of breath.  Interventions: Basic COPD education provided. Pursed lip breathing with teach back and return demonstration, provided handout via AVS on Pursed lip breathing, provided patient with [facility] Calendar and reinforced COPD action plan     . "My copay for one of my medicines was $400" (pt-stated)       Clinical Goal(s): Over the next 10 days, Ms. Muckle will provide the necessary supplementary documents (proof of out of pocket prescription expenditure, proof of household income) needed for medication assistance applications to CCM pharmacist.   Interventions:  CCM pharmacist will apply for medication assistance programs for inhalers and DM medications for 2020.

## 2018-04-05 ENCOUNTER — Other Ambulatory Visit: Payer: Self-pay | Admitting: Family Medicine

## 2018-04-05 DIAGNOSIS — E1169 Type 2 diabetes mellitus with other specified complication: Secondary | ICD-10-CM

## 2018-04-05 DIAGNOSIS — E669 Obesity, unspecified: Principal | ICD-10-CM

## 2018-04-09 ENCOUNTER — Other Ambulatory Visit (INDEPENDENT_AMBULATORY_CARE_PROVIDER_SITE_OTHER): Payer: Self-pay | Admitting: Nurse Practitioner

## 2018-04-09 DIAGNOSIS — I779 Disorder of arteries and arterioles, unspecified: Secondary | ICD-10-CM

## 2018-04-09 DIAGNOSIS — I739 Peripheral vascular disease, unspecified: Principal | ICD-10-CM

## 2018-04-13 ENCOUNTER — Telehealth: Payer: Self-pay

## 2018-04-13 ENCOUNTER — Ambulatory Visit: Payer: Self-pay | Admitting: Pharmacist

## 2018-04-13 DIAGNOSIS — J411 Mucopurulent chronic bronchitis: Secondary | ICD-10-CM

## 2018-04-13 DIAGNOSIS — Z598 Other problems related to housing and economic circumstances: Secondary | ICD-10-CM

## 2018-04-13 DIAGNOSIS — Z599 Problem related to housing and economic circumstances, unspecified: Secondary | ICD-10-CM

## 2018-04-13 DIAGNOSIS — E1169 Type 2 diabetes mellitus with other specified complication: Secondary | ICD-10-CM

## 2018-04-13 DIAGNOSIS — E669 Obesity, unspecified: Secondary | ICD-10-CM

## 2018-04-13 NOTE — Chronic Care Management (AMB) (Signed)
  Chronic Care Management   Note  04/13/2018 Name: Alexandra Foster MRN: [id number] DOB: 12/25/1950    67 y.o. year old female referred to Chronic Care Management by Dr. Ancil Boozer for financial difficulties affecting her overall health. Chronic conditions include COPD, obesity. Last office visit with Steele Sizer, MD was  03/09/18. Initial office visit with CCM team was 03/30/18.   Outreach call today was placed to schedule an appointment with CCM pharmacist to complete medication assistance applications for [id number].   Was unable to reach patient via telephone today and have left HIPAA compliant voicemail asking patient to return my call. (unsuccessful outreach #1).  Plan: Will follow-up within 3-5  business days via telephone.   Ruben Reason, PharmD Clinical Pharmacist [facility] [phone]

## 2018-04-15 ENCOUNTER — Encounter (INDEPENDENT_AMBULATORY_CARE_PROVIDER_SITE_OTHER): Payer: Medicare HMO | Admitting: Nurse Practitioner

## 2018-04-15 ENCOUNTER — Encounter (INDEPENDENT_AMBULATORY_CARE_PROVIDER_SITE_OTHER): Payer: Medicare HMO

## 2018-04-16 ENCOUNTER — Ambulatory Visit: Payer: Self-pay | Admitting: Pharmacist

## 2018-04-16 ENCOUNTER — Telehealth: Payer: Self-pay

## 2018-04-16 DIAGNOSIS — E1169 Type 2 diabetes mellitus with other specified complication: Secondary | ICD-10-CM

## 2018-04-16 DIAGNOSIS — Z599 Problem related to housing and economic circumstances, unspecified: Secondary | ICD-10-CM

## 2018-04-16 DIAGNOSIS — E669 Obesity, unspecified: Secondary | ICD-10-CM

## 2018-04-16 DIAGNOSIS — Z598 Other problems related to housing and economic circumstances: Secondary | ICD-10-CM

## 2018-04-16 NOTE — Chronic Care Management (AMB) (Signed)
  Chronic Care Management   Note  04/16/2018 Name: Clemie General MRN: [id number] DOB: December 21, 1950   Outreach call placed to patient today to review medication assistance plan for 2020.  Was unable to reach patient via telephone today and have left HIPAA compliant voicemail asking patient to return my call. (unsuccessful outreach #2).  Plan: Will follow-up within 3-5  business days via telephone.   Ruben Reason, PharmD Clinical Pharmacist [facility] [phone]

## 2018-04-19 ENCOUNTER — Other Ambulatory Visit: Payer: Self-pay | Admitting: Family Medicine

## 2018-04-19 DIAGNOSIS — E1169 Type 2 diabetes mellitus with other specified complication: Secondary | ICD-10-CM

## 2018-04-19 DIAGNOSIS — E669 Obesity, unspecified: Principal | ICD-10-CM

## 2018-04-20 ENCOUNTER — Ambulatory Visit: Payer: Self-pay | Admitting: Pharmacist

## 2018-04-20 ENCOUNTER — Telehealth: Payer: Self-pay

## 2018-04-20 DIAGNOSIS — Z598 Other problems related to housing and economic circumstances: Secondary | ICD-10-CM

## 2018-04-20 DIAGNOSIS — Z599 Problem related to housing and economic circumstances, unspecified: Secondary | ICD-10-CM

## 2018-04-20 NOTE — Chronic Care Management (AMB) (Addendum)
  Care Management   Note  04/20/2018 Name: Azariya Freeman MRN: [id number] DOB: [birth year]   Outreach call placed to patient today to review medication assistance plan for 2020.  Was unable to reach patient via telephone today and have left HIPAA compliant voicemail asking patient to return my call. (unsuccessful outreach #3).  Ms. Cassel did leave CM pharmacist a voicemail during off hours last night, indicating she is engaged in her care and the CM team will continue outreach.   Plan: Will follow-up within 3-5  business days via telephone.   Ruben Reason, PharmD Clinical Pharmacist [facility] [phone]

## 2018-04-22 ENCOUNTER — Other Ambulatory Visit: Payer: Self-pay | Admitting: Family Medicine

## 2018-04-22 ENCOUNTER — Ambulatory Visit: Payer: Medicare HMO

## 2018-04-22 ENCOUNTER — Ambulatory Visit (INDEPENDENT_AMBULATORY_CARE_PROVIDER_SITE_OTHER): Payer: Medicare HMO

## 2018-04-22 ENCOUNTER — Telehealth: Payer: Self-pay

## 2018-04-22 ENCOUNTER — Ambulatory Visit (INDEPENDENT_AMBULATORY_CARE_PROVIDER_SITE_OTHER): Payer: Medicare HMO | Admitting: Pharmacist

## 2018-04-22 VITALS — BP 112/72 | HR 76 | Temp 97.9°F | Resp 16 | Ht 65.0 in | Wt 257.4 lb

## 2018-04-22 DIAGNOSIS — Z598 Other problems related to housing and economic circumstances: Secondary | ICD-10-CM

## 2018-04-22 DIAGNOSIS — E1169 Type 2 diabetes mellitus with other specified complication: Secondary | ICD-10-CM | POA: Diagnosis not present

## 2018-04-22 DIAGNOSIS — E669 Obesity, unspecified: Secondary | ICD-10-CM | POA: Diagnosis not present

## 2018-04-22 DIAGNOSIS — Z1231 Encounter for screening mammogram for malignant neoplasm of breast: Secondary | ICD-10-CM | POA: Diagnosis not present

## 2018-04-22 DIAGNOSIS — M8580 Other specified disorders of bone density and structure, unspecified site: Secondary | ICD-10-CM | POA: Diagnosis not present

## 2018-04-22 DIAGNOSIS — J411 Mucopurulent chronic bronchitis: Secondary | ICD-10-CM

## 2018-04-22 DIAGNOSIS — Z0000 Encounter for general adult medical examination without abnormal findings: Secondary | ICD-10-CM | POA: Diagnosis not present

## 2018-04-22 DIAGNOSIS — Z599 Problem related to housing and economic circumstances, unspecified: Secondary | ICD-10-CM

## 2018-04-22 DIAGNOSIS — Z780 Asymptomatic menopausal state: Secondary | ICD-10-CM | POA: Diagnosis not present

## 2018-04-22 NOTE — Patient Instructions (Signed)
Alexandra Foster , Thank you for taking time to come for your Medicare Wellness Visit. I appreciate your ongoing commitment to your health goals. Please review the following plan we discussed and let me know if I can assist you in the future.   Screening recommendations/referrals: Colonoscopy: done 11/05/16 repeat in 2023 Mammogram: done 05/21/17. Please call [phone] to schedule your mammogram and bone density. Bone Density: done 02/06/16 Recommended yearly ophthalmology/optometry visit for glaucoma screening and checkup Recommended yearly dental visit for hygiene and checkup  Vaccinations: Influenza vaccine: postponed Pneumococcal vaccine: postponed Tdap vaccine: due please contact us if you get a cut or scrape Shingles vaccine: Shingrix discussed. Please contact your pharmacy for coverage information.     Advanced directives: Advance directive discussed with you today. I have provided a copy for you to complete at home and have notarized. Once this is complete please bring a copy in to our office so we can scan it into your chart.  Conditions/risks identified: Recommend increasing physical activity to 150 minutes per week.   Next appointment: Please follow up in one year for your Medicare Annual Wellness visit.     Preventive Care 6 Years and Older, Female Preventive care refers to lifestyle choices and visits with your health care provider that can promote health and wellness. What does preventive care include?  A yearly physical exam. This is also called an annual well check.  Dental exams once or twice a year.  Routine eye exams. Ask your health care provider how often you should have your eyes checked.  Personal lifestyle choices, including:  Daily care of your teeth and gums.  Regular physical activity.  Eating a healthy diet.  Avoiding tobacco and drug use.  Limiting alcohol use.  Practicing safe sex.  Taking low-dose aspirin every day.  Taking vitamin and  mineral supplements as recommended by your health care provider. What happens during an annual well check? The services and screenings done by your health care provider during your annual well check will depend on your age, overall health, lifestyle risk factors, and family history of disease. Counseling  Your health care provider may ask you questions about your:  Alcohol use.  Tobacco use.  Drug use.  Emotional well-being.  Home and relationship well-being.  Sexual activity.  Eating habits.  History of falls.  Memory and ability to understand (cognition).  Work and work Statistician.  Reproductive health. Screening  You may have the following tests or measurements:  Height, weight, and BMI.  Blood pressure.  Lipid and cholesterol levels. These may be checked every 5 years, or more frequently if you are over 69 years old.  Skin check.  Lung cancer screening. You may have this screening every year starting at age 50 if you have a 30-pack-year history of smoking and currently smoke or have quit within the past 15 years.  Fecal occult blood test (FOBT) of the stool. You may have this test every year starting at age 37.  Flexible sigmoidoscopy or colonoscopy. You may have a sigmoidoscopy every 5 years or a colonoscopy every 10 years starting at age 65.  Hepatitis C blood test.  Hepatitis B blood test.  Sexually transmitted disease (STD) testing.  Diabetes screening. This is done by checking your blood sugar (glucose) after you have not eaten for a while (fasting). You may have this done every 1-3 years.  Bone density scan. This is done to screen for osteoporosis. You may have this done starting at age 70.  Mammogram.  This may be done every 1-2 years. Talk to your health care provider about how often you should have regular mammograms. Talk with your health care provider about your test results, treatment options, and if necessary, the need for more tests. Vaccines    Your health care provider may recommend certain vaccines, such as:  Influenza vaccine. This is recommended every year.  Tetanus, diphtheria, and acellular pertussis (Tdap, Td) vaccine. You may need a Td booster every 10 years.  Zoster vaccine. You may need this after age 57.  Pneumococcal 13-valent conjugate (PCV13) vaccine. One dose is recommended after age 68.  Pneumococcal polysaccharide (PPSV23) vaccine. One dose is recommended after age 94. Talk to your health care provider about which screenings and vaccines you need and how often you need them. This information is not intended to replace advice given to you by your health care provider. Make sure you discuss any questions you have with your health care provider. Document Released: 04/27/2015 Document Revised: 12/19/2015 Document Reviewed: 01/30/2015 Elsevier Interactive Patient Education  2017 [place] Prevention in the Home Falls can cause injuries. They can happen to people of all ages. There are many things you can do to make your home safe and to help prevent falls. What can I do on the outside of my home?  Regularly fix the edges of walkways and driveways and fix any cracks.  Remove anything that might make you trip as you walk through a door, such as a raised step or threshold.  Trim any bushes or trees on the path to your home.  Use bright outdoor lighting.  Clear any walking paths of anything that might make someone trip, such as rocks or tools.  Regularly check to see if handrails are loose or broken. Make sure that both sides of any steps have handrails.  Any raised decks and porches should have guardrails on the edges.  Have any leaves, snow, or ice cleared regularly.  Use sand or salt on walking paths during winter.  Clean up any spills in your garage right away. This includes oil or grease spills. What can I do in the bathroom?  Use night lights.  Install grab bars by the toilet and in the  tub and shower. Do not use towel bars as grab bars.  Use non-skid mats or decals in the tub or shower.  If you need to sit down in the shower, use a plastic, non-slip stool.  Keep the floor dry. Clean up any water that spills on the floor as soon as it happens.  Remove soap buildup in the tub or shower regularly.  Attach bath mats securely with double-sided non-slip rug tape.  Do not have throw rugs and other things on the floor that can make you trip. What can I do in the bedroom?  Use night lights.  Make sure that you have a light by your bed that is easy to reach.  Do not use any sheets or blankets that are too big for your bed. They should not hang down onto the floor.  Have a firm chair that has side arms. You can use this for support while you get dressed.  Do not have throw rugs and other things on the floor that can make you trip. What can I do in the kitchen?  Clean up any spills right away.  Avoid walking on wet floors.  Keep items that you use a lot in easy-to-reach places.  If you need to reach  something above you, use a strong step stool that has a grab bar.  Keep electrical cords out of the way.  Do not use floor polish or wax that makes floors slippery. If you must use wax, use non-skid floor wax.  Do not have throw rugs and other things on the floor that can make you trip. What can I do with my stairs?  Do not leave any items on the stairs.  Make sure that there are handrails on both sides of the stairs and use them. Fix handrails that are broken or loose. Make sure that handrails are as long as the stairways.  Check any carpeting to make sure that it is firmly attached to the stairs. Fix any carpet that is loose or worn.  Avoid having throw rugs at the top or bottom of the stairs. If you do have throw rugs, attach them to the floor with carpet tape.  Make sure that you have a light switch at the top of the stairs and the bottom of the stairs. If you  do not have them, ask someone to add them for you. What else can I do to help prevent falls?  Wear shoes that:  Do not have high heels.  Have rubber bottoms.  Are comfortable and fit you well.  Are closed at the toe. Do not wear sandals.  If you use a stepladder:  Make sure that it is fully opened. Do not climb a closed stepladder.  Make sure that both sides of the stepladder are locked into place.  Ask someone to hold it for you, if possible.  Clearly mark and make sure that you can see:  Any grab bars or handrails.  First and last steps.  Where the edge of each step is.  Use tools that help you move around (mobility aids) if they are needed. These include:  Canes.  Walkers.  Scooters.  Crutches.  Turn on the lights when you go into a dark area. Replace any light bulbs as soon as they burn out.  Set up your furniture so you have a clear path. Avoid moving your furniture around.  If any of your floors are uneven, fix them.  If there are any pets around you, be aware of where they are.  Review your medicines with your doctor. Some medicines can make you feel dizzy. This can increase your chance of falling. Ask your doctor what other things that you can do to help prevent falls. This information is not intended to replace advice given to you by your health care provider. Make sure you discuss any questions you have with your health care provider. Document Released: 01/25/2009 Document Revised: 09/06/2015 Document Reviewed: 05/05/2014 Elsevier Interactive Patient Education  2017 [employer].

## 2018-04-22 NOTE — Chronic Care Management (AMB) (Signed)
Chronic Care Management   Note  04/22/2018 Name: Alexandra Foster MRN: [id number] DOB: 11/14/1950   Care Coordination:  Successful telephone encounter to Alexandra Foster, 68 y.o. year old female referred to Chronic Care Management by Dr. [name] for chronic care management and care coordination related to her financial difficulties. Chronic conditions include HTN, COPD, Morbid Obesity and Arterial Vascular Disease. Last office visit with [name], MD was 03/09/18. Alexandra Foster met with the CCM Team on 03/30/18 and goals were established. Today I followed up with Alexandra Foster to assess progress towards goals.  Unfortunately Alexandra Foster could not engage with RN CM during call. She states she "has a bad cough and is on the way to the primary care office for a 2pm appointment. Alexandra Foster request call from CCM RN CM at a later time.  Plan: Will follow up with Alexandra Foster next week    Alexandra Delo E. Suzie Portela, RN, BSN Nurse Care Coordinator [facility] Care Management  [phone]

## 2018-04-22 NOTE — Chronic Care Management (AMB) (Signed)
  Chronic Care Management   Follow Up Note   04/22/2018 Name: Ceonna Frazzini MRN: [id number] DOB: 07-Jan-1951  Referred by: Steele Sizer, MD Reason for referral : Chronic Care Management (Follow up- medication assistance)    Subjective: "I have a cough but I'm okay"   Assessment: Ms. Demario is a 68 year old female seeing Dr. Ancil Boozer for primary care. Ms. Rouse engaged with CCM team for assistance with medication costs, COPD education, and dry mouth side effects. Initial CCM office visit 03/30/18. CCM pharmacist follow up outreach today to follow up on documentation for medication assistance applications. HIPAA identifiers verified.  Ms. Fullenwider states that she will no longer pursue medication assistance applications due to her husband's concerns about sharing financial information. CCM pharmacist and nurse care manager STRONGLY urged Ms. Vanschaick to reconsider.   Goals Addressed            This Visit's Progress   . COMPLETED: "My copay for one of my medicines was $400" (pt-stated)       Clinical Goal(s): Over the next 10 days, Ms. Carnell will provide the necessary supplementary documents (proof of out of pocket prescription expenditure, proof of household income) needed for medication assistance applications to CCM pharmacist.   Interventions:  04/22/18: Patient has declined CCM clinical pharmacy and declines to apply to any medication assistance applications. 03/30/18: CCM pharmacist will apply for medication assistance programs for inhalers and DM medications for 2020.         Plan: CCM pharmacist will be delighted to assist Ms. Weathers in the future with medication assistance applications or speak to Ms. Schorr's husband about his concerns.    Ruben Reason, PharmD Clinical Pharmacist [facility] [phone]

## 2018-04-22 NOTE — Telephone Encounter (Signed)
Copied from [place] [id number]. Topic: General - Other >> Apr 22, 2018 10:57 AM Leward Quan A wrote: Reason for CRM: Patient called to get clarification on why Dr Ancil Boozer denied her Metformin. Patient states that she has been without this medication for 2 weeks now and would like to know why her refill was denied. Requesting a call back from Dr Ancil Boozer or her assistant  Ph# [phone]

## 2018-04-22 NOTE — Telephone Encounter (Signed)
It was sent 01/06 to [facility]

## 2018-04-22 NOTE — Progress Notes (Addendum)
Subjective:   Alexandra Foster is a 68 y.o. female who presents for Medicare Annual (Subsequent) preventive examination.  Review of Systems:   Cardiac Risk Factors include: advanced age (>35mn, >>36women);diabetes mellitus;dyslipidemia;hypertension;obesity (BMI >30kg/m2)     Objective:     Vitals: BP 112/72 (BP Location: Right Arm, Patient Position: Sitting, Cuff Size: Large)   Pulse 76   Temp 97.9 F (36.6 C) (Oral)   Resp 16   Ht '5\' 5"'  (1.651 m)   Wt 257 lb 6.4 oz (116.8 kg)   SpO2 94%   BMI 42.83 kg/m   Body mass index is 42.83 kg/m.  Advanced Directives 04/22/2018 11/17/2017 04/21/2017 11/05/2016 01/08/2016 04/13/2015 09/18/2014  Does Patient Have a Medical Advance Directive? No Yes No No No No No  Type of Advance Directive - Healthcare Power of [place] will - - - - -  Would patient like information on creating a medical advance directive? Yes (MAU/Ambulatory/Procedural Areas - Information given) - Yes (MAU/Ambulatory/Procedural Areas - Information given) No - Patient declined - No - patient declined information -    Tobacco Social History   Tobacco Use  Smoking Status Former Smoker  . Packs/day: 2.00  . Years: 40.00  . Pack years: 80.00  . Types: Cigarettes  . Last attempt to quit: 2008  . Years since quitting: 12.0  Smokeless Tobacco Former USystems developer . Types: Snuff  . Quit date: 04/2001  Tobacco Comment   smoking cessation materials not required     Counseling given: Not Answered Comment: smoking cessation materials not required   Clinical Intake:  Pre-visit preparation completed: Yes  Pain : No/denies pain     Nutritional Status: BMI > 30  Obese Nutritional Risks: None Diabetes: Yes CBG done?: No Did pt. bring in CBG monitor from home?: No   Nutrition Risk Assessment:  Has the patient had any N/V/D within the last 2 months?  No  Does the patient have any non-healing wounds?  No  Has the patient had any unintentional weight loss or weight  gain?  No   Diabetes:  Is the patient diabetic?  Yes  If diabetic, was a CBG obtained today?  No  Did the patient bring in their glucometer from home?  No  How often do you monitor your CBG's? Several times per week.   Financial Strains and Diabetes Management:  Are you having any financial strains with the device, your supplies or your medication? No .  Does the patient want to be seen by Chronic Care Management for management of their diabetes?  Yes  pt already being seen by care management to assist in getting COPD meds  Would the patient like to be referred to a Nutritionist or for Diabetic Management?  No   Diabetic Exams:  [facility]..   Diabetic Foot Exam: Completed 03/09/18.   How often do you need to have someone help you when you read instructions, pamphlets, or other written materials from your doctor or pharmacy?: 1 - Never What is the last grade level you completed in school?: 12th grade  Interpreter Needed?: No  Information entered by :: KClemetine MarkerLPN  Past Medical History:  Diagnosis Date  . Arthritis   . Asthma   . COPD (ch[place] obstructive pulmonary disease) ([place]   . Diabetes mellitus without complication ([place]   . Endometriosis   . GERD (gastroesophageal reflux disease)   . Hypertension   . Obesity   .  Sleep apnea    CPAP   Past Surgical History:  Procedure Laterality Date  . ABDOMINAL HYSTERECTOMY    . COLONOSCOPY  10/29/2006   Dr Allen Norris  . COLONOSCOPY WITH PROPOFOL N/A 11/05/2016   Procedure: COLONOSCOPY WITH PROPOFOL;  Surgeon: Robert Bellow, MD;  Location: [facility] ENDOSCOPY;  Service: Endoscopy;  Laterality: N/A;  . NASAL SINUS SURGERY  2002   Dr Carlis Abbott   Family History  Problem Relation Age of Onset  . Congestive Heart Failure Mother   . Coronary artery disease Father   . Breast cancer Sister 43  . Heart disease Brother   . Varicose Veins Brother   . Alcohol abuse  Brother    Social History   Socioeconomic History  . Marital status: Married    Spouse name: Trilby Drummer  . Number of children: 1  . Years of education: 72  . Highest education level: 12th grade  Occupational History    Employer: RETIRED  Social Needs  . Financial resource strain: Not hard at all  . Food insecurity:    Worry: Never true    Inability: Never true  . Transportation needs:    Medical: No    Non-medical: No  Tobacco Use  . Smoking status: Former Smoker    Packs/day: 2.00    Years: 40.00    Pack years: 80.00    Types: Cigarettes    Last attempt to quit: 2008    Years since quitting: 12.0  . Smokeless tobacco: Former Systems developer    Types: Snuff    Quit date: 04/2001  . Tobacco comment: smoking cessation materials not required  Substance and Sexual Activity  . Alcohol use: No    Alcohol/week: 0.0 standard drinks  . Drug use: No  . Sexual activity: Not Currently  Lifestyle  . Physical activity:    Days per week: 0 days    Minutes per session: 0 min  . Stress: Not at all  Relationships  . Social connections:    Talks on phone: More than three times a week    Gets together: More than three times a week    Attends religious service: 1 to 4 times per year    Active member of club or organization: No    Attends meetings of clubs or organizations: Never    Relationship status: Married  Other Topics Concern  . Not on file  Social History Narrative  . Not on file    Outpatient Encounter Medications as of 04/22/2018  Medication Sig  . aspirin 81 MG tablet Take 1 tablet (81 mg total) by mouth daily.  [name] atorvastatin (LIPITOR) 40 MG tablet Take 1 tablet (40 mg total) by mouth daily.  . Blood Glucose Calibration (ACCU-CHEK AVIVA) SOLN 1 each by In Vitro route daily.  . Blood Glucose Monitoring Suppl (ACCU-CHEK AVIVA PLUS) w/Device KIT 1 each by Does not apply route daily.  . calcium carbonate (OSCAL) 1500 (600 Ca) MG TABS tablet Take 600 mg of elemental calcium by mouth 2  (two) times daily with a meal.  . docusate sodium (COLACE) 100 MG capsule Take 100 mg by mouth daily.  [name] glucose blood (ACCU-CHEK AVIVA PLUS) test strip Use as instructed  . Lancets (ACCU-CHEK SOFT TOUCH) lancets Use as instructed  . metFORMIN (GLUCOPHAGE-XR) 750 MG 24 hr tablet TAKE 1 TABLET  DAILY WITH BREAKFAST. (Patient taking differently: Give w/food.)  . montelukast (SINGULAIR) 10 MG tablet Take 10 mg by mouth at bedtime.  . Multiple Vitamins-Minerals (MULTIVITAL PO)  Take 1 tablet by mouth daily.  [name] omeprazole (PRILOSEC) 20 MG capsule Take 1 capsule (20 mg total) by mouth daily.  [name] triamterene-hydrochlorothiazide (DYAZIDE) 37.5-25 MG capsule Take 1 each (1 capsule total) by mouth daily.  . vitamin B-12 (CYANOCOBALAMIN) 100 MCG tablet Take 100 mcg by mouth daily.  [name] albuterol (PROVENTIL HFA;VENTOLIN HFA) 108 (90 BASE) MCG/ACT inhaler Inhale into the lungs.  . umeclidinium-vilanterol (ANORO ELLIPTA) 62.5-25 MCG/INH AEPB Inhale 1 puff into the lungs daily. (Patient not taking: Reported on 03/30/2018)   No facility-administered encounter medications on file as of 04/22/2018.     Activities of Daily Living In your present state of health, do you have any difficulty performing the following activities: 04/22/2018 03/09/2018  Hearing? N N  Comment declines hearing aids -  Vision? N Y  Comment wears glasses -  Difficulty concentrating or making decisions? N N  Walking or climbing stairs? N Y  Comment - -  Dressing or bathing? N N  Doing errands, shopping? N N  Preparing Food and eating ? N -  Using the Toilet? N -  In the past six months, have you accidently leaked urine? Y -  Comment wears pads for protection -  Do you have problems with loss of bowel control? N -  Managing your Medications? N -  Managing your Finances? N -  Housekeeping or managing your Housekeeping? N -  Some recent data might be hidden    Patient Care Team: Steele Sizer, MD as PCP - General (Family  Medicine) Star Age, MD as Consulting Physician (Neurology) Carloyn Manner, MD as Referring Physician (Otolaryngology) Benedetto Goad, RN as Case Manager Cathi Roan, [facility] (Pharmacist)    Assessment:   This is a routine wellness examination for [place].  Exercise Activities and Dietary recommendations Current Exercise Habits: The patient does not participate in regular exercise at present, Exercise limited by: respiratory conditions(s)  Goals      Patient Stated   . "I stay constipated and my mouth stays dry" (pt-stated)     Pharmacist Clinical Goal(s): Over the next 30 days, patient will verbalize understanding of constipation care plan as evidenced by patient report  Interventions:  -[facility] pharmacist will review medication list for medications with anticholinergic side effects -[facility] pharmacist will recommend a constipation bowel regimen     . "I want to work on my breathing" (pt-stated)      Clinical Goals: Over the next 14 days, patient will verbalize utilization of purse lip breathing for relief from exertional shortness of breath.  Interventions: Basic COPD education provided. Pursed lip breathing with teach back and return demonstration, provided handout via AVS on Pursed lip breathing, provided patient with [facility] Calendar and reinforced COPD action plan      Other   . Exercise 150 min/wk Moderate Activity     Recommend to exercise at least 150 minutes per week.        Fall Risk Fall Risk  04/22/2018 01/14/2018 11/17/2017 11/03/2017 04/21/2017  Falls in the past year? 0 No No No No  Number falls in past yr: 0 - - - -   FALL RISK PREVENTION PERTAINING TO THE HOME:  Any stairs in or around the home WITH handrails? No  Home free of loose throw rugs in walkways, pet beds, electrical cords, etc? Yes  Adequate lighting in your home to reduce risk of falls? Yes   ASSISTIVE DEVICES UTILIZED TO PREVENT FALLS:  Life alert? No  Use of a cane, walker  or w/c? No  Grab bars  in the bathroom? Yes  Shower chair or bench in shower? Yes  Elevated toilet seat or a handicapped toilet? Yes   DME ORDERS:  DME order needed?  No   TIMED UP AND GO:  Was the test performed? Yes .  Length of time to ambulate 10 feet: 6 sec.   GAIT:  Appearance of gait: Gait stead-fast and without the use of an assistive device. Education: Fall risk prevention has been discussed.  Intervention(s) required? No   Depression Screen PHQ 2/9 Scores 04/22/2018 03/09/2018 01/14/2018 11/17/2017  PHQ - 2 Score 0 2 0 0  PHQ- 9 Score - 4 - -     Cognitive Function     6CIT Screen 04/22/2018 04/21/2017  What Year? 0 points 0 points  What month? 0 points 0 points  What time? 0 points 0 points  Count back from 20 2 points 0 points  Months in reverse 4 points 4 points  Repeat phrase 2 points 6 points  Total Score 8 10    Immunization History  Administered Date(s) Administered  . Meningococcal Conjugate 12/10/2006  . Td 12/10/2006    Qualifies for Shingles Vaccine? Yes . Due for Shingrix. Education has been provided regarding the importance of this vaccine. Pt has been advised to call insurance company to determine out of pocket expense. Advised may also receive vaccine at local pharmacy or Health Dept. Verbalized acceptance and understanding.  Tdap: Although this vaccine is not a covered service during a Wellness Exam, does the patient still wish to receive this vaccine today?  No .  Education has been provided regarding the importance of this vaccine. Advised may receive this vaccine at local pharmacy or Health Dept. Aware to provide a copy of the vaccination record if obtained from local pharmacy or Health Dept. Verbalized acceptance and understanding.  Flu Vaccine: Due for Flu vaccine. Does the patient want to receive this vaccine today?  No . Education has been provided regarding the importance of this vaccine but still declined. Advised may receive this vaccine at local pharmacy or Health  Dept. Aware to provide a copy of the vaccination record if obtained from local pharmacy or Health Dept. Verbalized acceptance and understanding.  Pneumococcal Vaccine: Due for Pneumococcal vaccine. Does the patient want to receive this vaccine today?  No . Education has been provided regarding the importance of this vaccine but still declined. Advised may receive this vaccine at local pharmacy or Health Dept. Aware to provide a copy of the vaccination record if obtained from local pharmacy or Health Dept. Verbalized acceptance and understanding.   Screening Tests Health Maintenance  Topic Date Due  . PNA vac Low Risk Adult (1 of 2 - PCV13) 11/05/2013  . TETANUS/TDAP  12/09/2016  . INFLUENZA VACCINE  03/01/2019 (Originally 11/12/2017)  . MAMMOGRAM  05/21/2018  . URINE MICROALBUMIN  11/04/2018  . COLONOSCOPY  11/05/2021  . DEXA SCAN  Completed  . Hepatitis C Screening  Completed    Cancer Screenings:  Colorectal Screening: Completed 11/05/16. Repeat every 5 years;   Mammogram: Completed 05/21/17. Repeat every year;  Ordered today. Pt provided with contact information and advised to call to schedule appt.   Bone Density: Completed 02/06/16. Results reflect OSTEOPENIA,  Repeat every 2 years. Ordered today. Pt provided with contact information and advised to call to schedule appt.   Lung Cancer Screening: (Low Dose CT Chest recommended if Age 65-80 years, 30 pack-year currently smoking OR have quit  w/in 15years.) does not qualify.   Additional Screening:  Hepatitis C Screening: does qualify; Completed 01/08/16  Vision Screening: Recommended annual ophthalmology exams for early detection of glaucoma and other disorders of the eye. Is the patient up to date with their annual eye exam?  Yes  Who is the provider or what is the name of the office in which the pt attends annual eye exams? [facility]  Dental Screening: Recommended annual dental exams for proper oral hygiene  Community  Resource Referral:  CRR required this visit?  No      Plan:    I have personally reviewed and addressed the Medicare Annual Wellness questionnaire and have noted the following in the patient's chart:  A. Medical and social history B. Use of alcohol, tobacco or illicit drugs  C. Current medications and supplements D. Functional ability and status E.  Nutritional status F.  Physical activity G. Advance directives H. List of other physicians I.  Hospitalizations, surgeries, and ER visits in previous 12 months J.  [place] such as hearing and vision if needed, cognitive and depression L. Referrals and appointments   In addition, I have reviewed and discussed with patient certain preventive protocols, quality metrics, and best practice recommendations. A written personalized care plan for preventive services as well as general preventive health recommendations were provided to patient.   Signed,  Clemetine Marker, LPN Nurse Health Advisor   Nurse Notes: Pt c/o cough for the past week. She attended a funeral last Thursday and got wet in the rain and has felt like she has had a cold ever since. She did a breathing tx earlier today with some relief and has appt with Raelyn Ensign tomorrow morning for cough due to nothing OTC helping. She is working with care management to hopefully get an anoro inhaler to help with her COPD as well. Pt appreciative of visit today.

## 2018-04-23 ENCOUNTER — Encounter: Payer: Self-pay | Admitting: Family Medicine

## 2018-04-23 ENCOUNTER — Ambulatory Visit (INDEPENDENT_AMBULATORY_CARE_PROVIDER_SITE_OTHER): Payer: Medicare HMO | Admitting: Family Medicine

## 2018-04-23 VITALS — BP 130/80 | HR 85 | Temp 98.4°F | Resp 16 | Ht 65.0 in | Wt 256.0 lb

## 2018-04-23 DIAGNOSIS — G4733 Obstructive sleep apnea (adult) (pediatric): Secondary | ICD-10-CM

## 2018-04-23 DIAGNOSIS — E785 Hyperlipidemia, unspecified: Secondary | ICD-10-CM | POA: Diagnosis not present

## 2018-04-23 DIAGNOSIS — E669 Obesity, unspecified: Secondary | ICD-10-CM

## 2018-04-23 DIAGNOSIS — E1169 Type 2 diabetes mellitus with other specified complication: Secondary | ICD-10-CM | POA: Insufficient documentation

## 2018-04-23 DIAGNOSIS — R718 Other abnormality of red blood cells: Secondary | ICD-10-CM | POA: Diagnosis not present

## 2018-04-23 DIAGNOSIS — J441 Chronic obstructive pulmonary disease with (acute) exacerbation: Secondary | ICD-10-CM | POA: Diagnosis not present

## 2018-04-23 MED ORDER — ALBUTEROL SULFATE HFA 108 (90 BASE) MCG/ACT IN AERS: 1.0000 | INHALATION_SPRAY | RESPIRATORY_TRACT | 2 refills | Status: DC | PRN

## 2018-04-23 MED ORDER — BENZONATATE 100 MG PO CAPS: 100.0000 mg | ORAL_CAPSULE | Freq: Three times a day (TID) | ORAL | 0 refills | Status: DC | PRN

## 2018-04-23 MED ORDER — AZITHROMYCIN 250 MG PO TABS: ORAL_TABLET | ORAL | 0 refills | Status: DC

## 2018-04-23 MED ORDER — ALBUTEROL SULFATE (2.5 MG/3ML) 0.083% IN NEBU: 2.5000 mg | INHALATION_SOLUTION | Freq: Four times a day (QID) | RESPIRATORY_TRACT | 1 refills | Status: DC | PRN

## 2018-04-23 MED ORDER — PREDNISONE 10 MG PO TABS: ORAL_TABLET | ORAL | 0 refills | Status: AC

## 2018-04-23 MED ORDER — UMECLIDINIUM-VILANTEROL 62.5-25 MCG/INH IN AEPB: 1.0000 | INHALATION_SPRAY | Freq: Every day | RESPIRATORY_TRACT | 1 refills | Status: DC

## 2018-04-23 NOTE — Patient Instructions (Signed)
Cool Mist Vaporizer A cool mist vaporizer is a device that releases a cool mist into the air. If you have a cough or a cold, using a vaporizer may help relieve your symptoms. The mist adds moisture to the air, which may help thin your mucus and make it less sticky. When your mucus is thin and less sticky, it easier for you to breathe and to cough up secretions. Do not use a vaporizer if you are allergic to mold. Follow these instructions at home:  Follow the instructions that come with the vaporizer.  Do not use anything other than distilled water in the vaporizer.  Do not run the vaporizer all of the time. Doing that can cause mold or bacteria to grow in the vaporizer.  Clean the vaporizer after each time that you use it.  Clean and dry the vaporizer well before storing it.  Stop using the vaporizer if your breathing symptoms get worse. This information is not intended to replace advice given to you by your health care provider. Make sure you discuss any questions you have with your health care provider. Document Released: 12/27/2003 Document Revised: 10/19/2015 Document Reviewed: 06/30/2015 Elsevier Interactive Patient Education  2019 [employer]   Chronic Obstructive Pulmonary Disease Exacerbation Chronic obstructive pulmonary disease (COPD) is a long-term (chronic) lung problem. In COPD, the flow of air from the lungs is limited. COPD exacerbations are times that breathing gets worse and you need more than your normal treatment. Without treatment, they can be life threatening. If they happen often, your lungs can become more damaged. If your COPD gets worse, your doctor may treat you with:  Medicines.  Oxygen.  Different ways to clear your airway, such as using a mask. Follow these instructions at home: Medicines  Take over-the-counter and prescription medicines only as told by your doctor.  If you take an antibiotic or steroid medicine, do not stop taking the medicine even  if you start to feel better.  Keep up with shots (vaccinations) as told by your doctor. Be sure to get a yearly (annual) flu shot. Lifestyle  Do not smoke. If you need help quitting, ask your doctor.  Eat healthy foods.  Exercise regularly.  Get plenty of sleep.  Avoid tobacco smoke and other things that can bother your lungs.  Wash your hands often with soap and water. This will help keep you from getting an infection. If you cannot use soap and water, use hand sanitizer.  During flu season, avoid areas that are crowded with people. General instructions  Drink enough fluid to keep your pee (urine) clear or pale yellow. Do not do this if your doctor has told you not to.  Use a cool mist machine (vaporizer).  If you use oxygen or a machine that turns medicine into a mist (nebulizer), continue to use it as told.  Follow all instructions for rehabilitation. These are steps you can take to make your body work better.  Keep all follow-up visits as told by your doctor. This is important. Contact a doctor if:  Your COPD symptoms get worse than normal. Get help right away if:  You are short of breath and it gets worse.  You have trouble talking.  You have chest pain.  You cough up blood.  You have a fever.  You keep throwing up (vomiting).  You feel weak or you pass out (faint).  You feel confused.  You are not able to sleep because of your symptoms.  You are  not able to do daily activities. Summary  COPD exacerbations are times that breathing gets worse and you need more treatment than normal.  COPD exacerbations can be very serious and may cause your lungs to become more damaged.  Do not smoke. If you need help quitting, ask your doctor.  Stay up-to-date on your shots. Get a flu shot every year. This information is not intended to replace advice given to you by your health care provider. Make sure you discuss any questions you have with your health care  provider. Document Released: 03/20/2011 Document Revised: 05/05/2016 Document Reviewed: 05/05/2016 Elsevier Interactive Patient Education  2019 [employer].

## 2018-04-23 NOTE — Progress Notes (Signed)
Name: Alexandra Foster   MRN: [id number]    DOB: 10-30-1950   Date:04/23/2018       Progress Note  Subjective  Chief Complaint  Chief Complaint  Patient presents with  . Cough    HPI  PT presents with concern for ongoing cough for 2 weeks.  She notes 2 weeks ago she was out in the rain for a funeral and noticed that a cough started soon after.  She has been out of her anoro and albuterol HFA for some time now.  She does have a neb machine and is doing albuterol nebs TID - this does seem to help.  She has taken prednisone in the past and done well on it.  She denies shortness of breath with exertion, denies fevers or chills, no URI symptoms.  - DMII: She does have DM, but last A1C was 6.4%.  She is diet controlled only. Discussed DM sick day care with her including eating regular meals and looking out for polyuria, polydipsia, and polyphagia.   Patient Active Problem List   Diagnosis Date Noted  . Osteoarthritis of both knees 01/14/2018  . Urge incontinence 03/14/2016  . Osteopenia 02/13/2016  . OSA (obstructive sleep apnea) 01/08/2016  . Allergic rhinitis 04/13/2015  . Arterial vascular disease 09/18/2014  . Carotid artery plaque 09/18/2014  . COPD (chronic obstructive pulmonary disease) ([place]) 09/18/2014  . Morbid obesity ([place]) 09/18/2014  . Esophagitis, reflux 09/18/2014  . Essential hypertension 09/21/2006    Social History   Tobacco Use  . Smoking status: Former Smoker    Packs/day: 2.00    Years: 40.00    Pack years: 80.00    Types: Cigarettes    Last attempt to quit: 2008    Years since quitting: 12.0  . Smokeless tobacco: Former Systems developer    Types: Snuff    Quit date: 04/2001  . Tobacco comment: smoking cessation materials not required  Substance Use Topics  . Alcohol use: No    Alcohol/week: 0.0 standard drinks     Current Outpatient Medications:  .  aspirin 81 MG tablet, Take 1 tablet (81 mg total) by mouth daily., Disp: 30 tablet, Rfl: 0 .  atorvastatin  (LIPITOR) 40 MG tablet, Take 1 tablet (40 mg total) by mouth daily., Disp: 90 tablet, Rfl: 1 .  Blood Glucose Calibration (ACCU-CHEK AVIVA) SOLN, 1 each by In Vitro route daily., Disp: 1 each, Rfl: 0 .  Blood Glucose Monitoring Suppl (ACCU-CHEK AVIVA PLUS) w/Device KIT, 1 each by Does not apply route daily., Disp: 1 kit, Rfl: 0 .  calcium carbonate (OSCAL) 1500 (600 Ca) MG TABS tablet, Take 600 mg of elemental calcium by mouth 2 (two) times daily with a meal., Disp: , Rfl:  .  docusate sodium (COLACE) 100 MG capsule, Take 100 mg by mouth daily., Disp: , Rfl:  .  glucose blood (ACCU-CHEK AVIVA PLUS) test strip, Use as instructed, Disp: 100 each, Rfl: 12 .  Lancets (ACCU-CHEK SOFT TOUCH) lancets, Use as instructed, Disp: 100 each, Rfl: 12 .  metFORMIN (GLUCOPHAGE-XR) 750 MG 24 hr tablet, TAKE 1 TABLET  DAILY WITH BREAKFAST. (Patient taking differently: Give w/food.), Disp: 90 tablet, Rfl: 1 .  montelukast (SINGULAIR) 10 MG tablet, Take 10 mg by mouth at bedtime., Disp: , Rfl:  .  Multiple Vitamins-Minerals (MULTIVITAL PO), Take 1 tablet by mouth daily., Disp: , Rfl:  .  omeprazole (PRILOSEC) 20 MG capsule, Take 1 capsule (20 mg total) by mouth daily., Disp: 90 capsule, Rfl:  1 .  triamterene-hydrochlorothiazide (DYAZIDE) 37.5-25 MG capsule, Take 1 each (1 capsule total) by mouth daily., Disp: 90 capsule, Rfl: 1 .  umeclidinium-vilanterol (ANORO ELLIPTA) 62.5-25 MCG/INH AEPB, Inhale 1 puff into the lungs daily., Disp: 180 each, Rfl: 1 .  vitamin B-12 (CYANOCOBALAMIN) 100 MCG tablet, Take 100 mcg by mouth daily., Disp: , Rfl:  .  albuterol (PROVENTIL HFA;VENTOLIN HFA) 108 (90 BASE) MCG/ACT inhaler, Inhale into the lungs., Disp: , Rfl:   No Known Allergies  I personally reviewed active problem list, medication list, allergies, lab results with the patient/caregiver today.  ROS  Constitutional: Negative for fever or weight change.  Respiratory: See HPI Cardiovascular: Negative for chest pain or  palpitations.  Gastrointestinal: Negative for abdominal pain, no bowel changes.  Musculoskeletal: Negative for gait problem or joint swelling.  Skin: Negative for rash.  Neurological: Negative for dizziness or headache.  No other specific complaints in a complete review of systems (except as listed in HPI above).   Objective  Vitals:   04/23/18 0729  BP: 130/80  Pulse: 85  Resp: 16  Temp: 98.4 F (36.9 C)  TempSrc: Oral  SpO2: 97%  Weight: 256 lb (116.1 kg)  Height: _0  (1.651 m)   Body mass index is 42.6 kg/m.  Nursing Note and Vital Signs reviewed.  Physical Exam  Constitutional: Patient appears well-developed and well-nourished. Obese No distress.  HEENT: head atraumatic, normocephalic, pupils equal and reactive to light, Bilateral TM's without erythema or effusion,  bilateral maxillary and frontal sinuses are non-tender, neck supple without lymphadenopathy, throat within normal limits - no erythema or exudate, no tonsillar swelling Cardiovascular: Normal rate, regular rhythm and normal heart sounds.  No murmur heard. No BLE edema. Pulmonary/Chest: Effort normal and breath sounds with mild inspiratory wheezes throughout and rhonchi at the bases. No respiratory distress. Abdominal: Soft, bowel sounds normal, there is no tenderness, no HSM Psychiatric: Patient has a normal mood and affect. behavior is normal. Judgment and thought content normal.  No results found for this or any previous visit (from the past 72 hour(s)).  Assessment & Plan  1. Chronic obstructive pulmonary disease with acute exacerbation (HCC) - albuterol (PROVENTIL HFA;VENTOLIN HFA) 108 (90 Base) MCG/ACT inhaler; Inhale 1-2 puffs into the lungs every 4 (four) hours as needed for wheezing or shortness of breath.  Dispense: 1 Inhaler; Refill: 2 - umeclidinium-vilanterol (ANORO ELLIPTA) 62.5-25 MCG/INH AEPB; Inhale 1 puff into the lungs daily.  Dispense: 180 each; Refill: 1 - albuterol (PROVENTIL) (2.5  MG/3ML) 0.083% nebulizer solution; Take 3 mLs (2.5 mg total) by nebulization every 6 (six) hours as needed for wheezing or shortness of breath.  Dispense: 150 mL; Refill: 1 - benzonatate (TESSALON PERLES) 100 MG capsule; Take 1-2 capsules (100-200 mg total) by mouth 3 (three) times daily as needed.  Dispense: 30 capsule; Refill: 0 - azithromycin (ZITHROMAX) 250 MG tablet; Day 1: Take 2 tablets by mouth; Days 2-5: Take 1 tablet by mouth daily  Dispense: 6 tablet; Refill: 0 - predniSONE (DELTASONE) 10 MG tablet; Take 5 tablets (50 mg total) by mouth daily with breakfast for 1 day, THEN 4 tablets (40 mg total) daily with breakfast for 1 day, THEN 3 tablets (30 mg total) daily with breakfast for 1 day, THEN 2 tablets (20 mg total) daily with breakfast for 1 day, THEN 1 tablet (10 mg total) daily with breakfast for 1 day.  Dispense: 15 tablet; Refill: 0  2. OSA (obstructive sleep apnea) - Continue CPAP at night  3.  Diabetes mellitus type 2 in obese ([place]) - Will monitor for polyuria, polyphagia, or polydipsia.  -Red flags and when to present for emergency care or RTC including fever >101.32F, chest pain, shortness of breath, new/worsening/un-resolving symptoms, reviewed with patient at time of visit. Follow up and care instructions discussed and provided in AVS.

## 2018-04-23 NOTE — Telephone Encounter (Signed)
Spoke with [facility] and her Metformin was shipped out on 04/21/2018. Left a message to inform the patient.

## 2018-04-24 LAB — HEMOGLOBIN A1C
HEMOGLOBIN A1C: 6.1 %{Hb} — AB (ref <5.7)
MEAN PLASMA GLUCOSE: 128 (calc)
eAG (mmol/L): 7.1 (calc)

## 2018-04-24 LAB — CBC WITH DIFFERENTIAL/PLATELET
Absolute Monocytes: 362 cells/uL (ref 200–950)
BASOS PCT: 0.6 %
Basophils Absolute: 43 cells/uL (ref 0–200)
EOS PCT: 6.1 %
Eosinophils Absolute: 433 cells/uL (ref 15–500)
HCT: 38.6 % (ref 35.0–45.0)
Hemoglobin: 12.2 g/dL (ref 11.7–15.5)
Lymphs Abs: 1732 cells/uL (ref 850–3900)
MCH: 24.7 pg — ABNORMAL LOW (ref 27.0–33.0)
MCHC: 31.6 g/dL — ABNORMAL LOW (ref 32.0–36.0)
MCV: 78.3 fL — ABNORMAL LOW (ref 80.0–100.0)
MPV: 10.7 fL (ref 7.5–12.5)
Monocytes Relative: 5.1 %
NEUTROS PCT: 63.8 %
Neutro Abs: 4530 cells/uL (ref 1500–7800)
PLATELETS: 366 10*3/uL (ref 140–400)
RBC: 4.93 10*6/uL (ref 3.80–5.10)
RDW: 13.1 % (ref 11.0–15.0)
TOTAL LYMPHOCYTE: 24.4 %
WBC: 7.1 10*3/uL (ref 3.8–10.8)

## 2018-04-24 LAB — LIPID PANEL
Cholesterol: 96 mg/dL (ref <200)
HDL: 33 mg/dL — ABNORMAL LOW (ref >50)
LDL CHOLESTEROL (CALC): 48 mg/dL
NON-HDL CHOLESTEROL (CALC): 63 mg/dL (ref <130)
TRIGLYCERIDES: 72 mg/dL (ref <150)
Total CHOL/HDL Ratio: 2.9 (calc) (ref <5.0)

## 2018-04-24 LAB — IRON,TIBC AND FERRITIN PANEL
%SAT: 11 % — AB (ref 16–45)
Ferritin: 23 ng/mL (ref 16–288)
IRON: 42 ug/dL — AB (ref 45–160)
TIBC: 374 ug/dL (ref 250–450)

## 2018-04-26 NOTE — Patient Instructions (Signed)
Goals Addressed            This Visit's Progress   . COMPLETED: "My copay for one of my medicines was $400" (pt-stated)       Clinical Goal(s): Over the next 10 days, Ms. Raynes will provide the necessary supplementary documents (proof of out of pocket prescription expenditure, proof of household income) needed for medication assistance applications to CCM pharmacist.   Interventions:  04/22/18: Patient has declined CCM clinical pharmacy and declines to apply to any medication assistance applications. 03/30/18: CCM pharmacist will apply for medication assistance programs for inhalers and DM medications for 2020.        1. Alexandra Foster (pharmacist) will be happy to speak with your husband about his concerns applying for medication assistance. We strongly urge you to reconsider applying and are happy to help in the future with applications if you reconsider.   Please call a member of the CCM (Chronic Care Management) Team with any questions or case management needs:   Alexandra Foster, BSN Nurse Care Coordinator  [phone]  Ruben Reason, PharmD  Clinical Pharmacist  [phone]  The patient verbalized understanding of instructions provided today and declined a print copy of patient instruction materials.

## 2018-04-28 ENCOUNTER — Encounter (INDEPENDENT_AMBULATORY_CARE_PROVIDER_SITE_OTHER): Payer: Self-pay | Admitting: Vascular Surgery

## 2018-04-28 ENCOUNTER — Ambulatory Visit (INDEPENDENT_AMBULATORY_CARE_PROVIDER_SITE_OTHER): Payer: Medicare HMO | Admitting: Vascular Surgery

## 2018-04-28 ENCOUNTER — Ambulatory Visit (INDEPENDENT_AMBULATORY_CARE_PROVIDER_SITE_OTHER): Payer: Medicare HMO

## 2018-04-28 VITALS — BP 142/75 | HR 75 | Resp 18 | Ht 65.0 in | Wt 254.6 lb

## 2018-04-28 DIAGNOSIS — M79605 Pain in left leg: Secondary | ICD-10-CM | POA: Diagnosis not present

## 2018-04-28 DIAGNOSIS — R600 Localized edema: Secondary | ICD-10-CM | POA: Diagnosis not present

## 2018-04-28 DIAGNOSIS — M79604 Pain in right leg: Secondary | ICD-10-CM | POA: Diagnosis not present

## 2018-04-28 DIAGNOSIS — I10 Essential (primary) hypertension: Secondary | ICD-10-CM | POA: Diagnosis not present

## 2018-04-28 DIAGNOSIS — I779 Disorder of arteries and arterioles, unspecified: Secondary | ICD-10-CM

## 2018-04-28 DIAGNOSIS — I739 Peripheral vascular disease, unspecified: Secondary | ICD-10-CM | POA: Diagnosis not present

## 2018-04-28 DIAGNOSIS — E669 Obesity, unspecified: Secondary | ICD-10-CM

## 2018-04-28 DIAGNOSIS — E1169 Type 2 diabetes mellitus with other specified complication: Secondary | ICD-10-CM | POA: Diagnosis not present

## 2018-04-29 ENCOUNTER — Ambulatory Visit: Payer: Self-pay

## 2018-04-29 ENCOUNTER — Telehealth: Payer: Self-pay

## 2018-04-29 DIAGNOSIS — J441 Chronic obstructive pulmonary disease with (acute) exacerbation: Secondary | ICD-10-CM

## 2018-04-29 DIAGNOSIS — E1169 Type 2 diabetes mellitus with other specified complication: Secondary | ICD-10-CM

## 2018-04-29 DIAGNOSIS — Z598 Other problems related to housing and economic circumstances: Secondary | ICD-10-CM

## 2018-04-29 DIAGNOSIS — Z599 Problem related to housing and economic circumstances, unspecified: Secondary | ICD-10-CM

## 2018-04-29 DIAGNOSIS — E669 Obesity, unspecified: Secondary | ICD-10-CM

## 2018-04-29 NOTE — Chronic Care Management (AMB) (Signed)
  Chronic Care Management Note   Successful telephone encounter to [facility], 68 y.o.year old femalereferred to Chronic Care Management by Dr. Laure Kidney chronic care management and care coordination related to her financial difficulties. Chronic conditions includeHTN, COPD, Morbid Obesity and Arterial Vascular Disease.Last office visit with Steele Sizer, [place] 03/09/18. Ms Grays met with the CCM Team on 03/30/18 and goals were established. Today I followed up with Ms. Bourcier to assess progress towards goals.  Unfortunately Ms. Dishner was unavailable per her husband. HIPAA compliant message left with husband requesting patient call back.  Plan: Will follow-up within 3-5  business days via telephone.     Theoplis Garciagarcia E. Rollene Rotunda, RN, BSN Nurse Care Coordinator [facility] Care Management  [phone]

## 2018-05-03 ENCOUNTER — Encounter (INDEPENDENT_AMBULATORY_CARE_PROVIDER_SITE_OTHER): Payer: Self-pay | Admitting: Vascular Surgery

## 2018-05-03 ENCOUNTER — Telehealth: Payer: Self-pay

## 2018-05-03 DIAGNOSIS — M79604 Pain in right leg: Secondary | ICD-10-CM | POA: Insufficient documentation

## 2018-05-03 DIAGNOSIS — M79605 Pain in left leg: Principal | ICD-10-CM

## 2018-05-03 DIAGNOSIS — R600 Localized edema: Secondary | ICD-10-CM | POA: Insufficient documentation

## 2018-05-03 NOTE — Progress Notes (Signed)
Subjective:    Patient ID: Oneida Alar, female    DOB: 1951-01-09, 68 y.o.   MRN: [id number] Chief Complaint  Patient presents with  . Establish Care   Presents as a new patient referred by Dr. Carlynn Purl for evaluation of carotid artery disease.  The patient denies any history of amaurosis fugax, neurological deficits or past TIA/CVA.  The patient underwent a bilateral carotid duplex which was notable for no evidence of stenosis in the bilateral internal carotid arteries.  Bilateral vertebral arteries demonstrate antegrade flow.  Normal flow hemodynamics were seen in the bilateral subclavian arteries. The patient endorses a history of experiencing "cramps" in the bilateral legs which occurs mostly at night.  The patient notes that these cramps do not always occur with activity.  The patient also experiences swelling to the bilateral legs which worsens towards the end of the day or with sitting and standing for long periods of time.  Patient denies any claudication-like symptoms, rest pain or ulcer formation to the bilateral legs.  At this time, the patient does not engage in conservative therapy.  The patient denies any recent recurrent bouts of cellulitis.  Patient denies any fever, nausea vomiting.  Review of Systems  Constitutional: Negative.   HENT: Negative.   Eyes: Negative.   Respiratory: Negative.   Cardiovascular: Positive for leg swelling.       Lower extremity cramping  Gastrointestinal: Negative.   Endocrine: Negative.   Genitourinary: Negative.   Musculoskeletal: Negative.   Skin: Negative.   Allergic/Immunologic: Negative.   Neurological: Negative.   Hematological: Negative.   Psychiatric/Behavioral: Negative.       Objective:   Physical Exam Vitals signs reviewed.  Constitutional:      Appearance: Normal appearance.  HENT:     Head: Normocephalic and atraumatic.     Right Ear: External ear normal.     Left Ear: External ear normal.     Nose: Nose normal.   Mouth/Throat:     Mouth: Mucous membranes are dry.     Pharynx: Oropharynx is clear.  Eyes:     Extraocular Movements: Extraocular movements intact.     Conjunctiva/sclera: Conjunctivae normal.     Pupils: Pupils are equal, round, and reactive to light.  Neck:     Musculoskeletal: Normal range of motion.     Comments: No carotid bruits noted on exam Cardiovascular:     Rate and Rhythm: Normal rate and regular rhythm.     Comments: Hard to palpate pedal pulses on exam Pulmonary:     Effort: Pulmonary effort is normal.     Breath sounds: Normal breath sounds.  Musculoskeletal: Normal range of motion.        General: Swelling (Mild nonpitting bilateral lower extremity edema) present.  Neurological:     General: No focal deficit present.     Mental Status: She is alert and oriented to person, place, and time. Mental status is at baseline.  Psychiatric:        Mood and Affect: Mood normal.        Behavior: Behavior normal.        Thought Content: Thought content normal.        Judgment: Judgment normal.    BP (!) 142/75 (BP Location: Right Arm, Patient Position: Sitting)   Pulse 75   Resp 18   Ht 5\' 5"  (1.651 m)   Wt 254 lb 9.6 oz (115.5 kg)   BMI 42.37 kg/m   Past Medical History:  Diagnosis  Date  . Arthritis   . Asthma   . COPD (chronic obstructive pulmonary disease) (HCC)   . Diabetes mellitus without complication (HCC)   . Endometriosis   . GERD (gastroesophageal reflux disease)   . Hypertension   . Obesity   . Sleep apnea    CPAP   Social History   Socioeconomic History  . Marital status: Married    Spouse name: [name]  . Number of children: 1  . Years of education: 50  . Highest education level: 12th grade  Occupational History    Employer: RETIRED  Social Needs  . Financial resource strain: Not hard at all  . Food insecurity:    Worry: Never true    Inability: Never true  . Transportation needs:    Medical: No    Non-medical: No  Tobacco Use  .  Smoking status: Former Smoker    Packs/day: 2.00    Years: 40.00    Pack years: 80.00    Types: Cigarettes    Last attempt to quit: 2008    Years since quitting: 12.0  . Smokeless tobacco: Former Neurosurgeon    Types: Snuff    Quit date: 04/2001  . Tobacco comment: smoking cessation materials not required  Substance and Sexual Activity  . Alcohol use: No    Alcohol/week: 0.0 standard drinks  . Drug use: No  . Sexual activity: Not Currently  Lifestyle  . Physical activity:    Days per week: 0 days    Minutes per session: 0 min  . Stress: Not at all  Relationships  . Social connections:    Talks on phone: More than three times a week    Gets together: More than three times a week    Attends religious service: 1 to 4 times per year    Active member of club or organization: No    Attends meetings of clubs or organizations: Never    Relationship status: Married  . Intimate partner violence:    Fear of current or ex partner: No    Emotionally abused: No    Physically abused: No    Forced sexual activity: No  Other Topics Concern  . Not on file  Social History Narrative  . Not on file   Past Surgical History:  Procedure Laterality Date  . ABDOMINAL HYSTERECTOMY    . COLONOSCOPY  10/29/2006   Dr Servando Snare  . COLONOSCOPY WITH PROPOFOL N/A 11/05/2016   Procedure: COLONOSCOPY WITH PROPOFOL;  Surgeon: Earline Mayotte, MD;  Location: [facility] ENDOSCOPY;  Service: Endoscopy;  Laterality: N/A;  . NASAL SINUS SURGERY  2002   Dr Chestine Spore   Family History  Problem Relation Age of Onset  . Congestive Heart Failure Mother   . Coronary artery disease Father   . Breast cancer Sister 40  . Heart disease Brother   . Varicose Veins Brother   . Alcohol abuse Brother     No Known Allergies     Assessment & Plan:  Presents as a new patient referred by Dr. Carlynn Purl for evaluation of carotid artery disease.  The patient denies any history of amaurosis fugax, neurological deficits or past TIA/CVA.  The  patient underwent a bilateral carotid duplex which was notable for no evidence of stenosis in the bilateral internal carotid arteries.  Bilateral vertebral arteries demonstrate antegrade flow.  Normal flow hemodynamics were seen in the bilateral subclavian arteries. The patient endorses a history of experiencing "cramps" in the bilateral legs which occurs mostly at  night.  The patient notes that these cramps do not always occur with activity.  The patient also experiences swelling to the bilateral legs which worsens towards the end of the day or with sitting and standing for long periods of time.  Patient denies any claudication-like symptoms, rest pain or ulcer formation to the bilateral legs.  At this time, the patient does not engage in conservative therapy.  The patient denies any recent recurrent bouts of cellulitis.  Patient denies any fever, nausea vomiting.  1. Lower extremity pain, bilateral - New Endorses a history of bilateral lower extremity calf cramping which occurs intermittently at night not always associated with activity Hard to palpate pedal pulses on exam Patient with multiple risk factors for peripheral artery disease I will bring the patient back and have her undergo bilateral ABI to rule out any contributing peripheral artery disease  - VAS [place] LOWER EXTREMITY VENOUS REFLUX; Future  2. Bilateral lower extremity edema - New The patient does experience bilateral lower extremity edema associated with cramping at night I will have the patient come back and undergo bilateral lower extremity venous duplex to rule out any contributing venous disease The patient was encouraged to start wearing compression socks medical grade (20-2mmhg).  Placing the socks in the a.m. and removing them in the p.m.  The patient was encouraged not to sleep in her stockings. The patient was encouraged to elevate her legs heart level or higher multiple times a day The patient was encouraged to remain as  active as possible  - VAS [place] LOWER EXTREMITY VENOUS REFLUX; Future  3. Essential hypertension - Stable On appropriate medications Encouraged good control as its slows the progression of atherosclerotic disease  4. Diabetes mellitus type 2 in obese (HCC) - Stable On appropriate medications Encouraged good control as its slows the progression of atherosclerotic disease  5. Carotid Stenosis - None No carotid stenosis seen on duplex No carotid bruits heard on exam  Current Outpatient Medications on File Prior to Visit  Medication Sig Dispense Refill  . albuterol (PROVENTIL HFA;VENTOLIN HFA) 108 (90 Base) MCG/ACT inhaler Inhale 1-2 puffs into the lungs every 4 (four) hours as needed for wheezing or shortness of breath. 1 Inhaler 2  . albuterol (PROVENTIL) (2.5 MG/3ML) 0.083% nebulizer solution Take 3 mLs (2.5 mg total) by nebulization every 6 (six) hours as needed for wheezing or shortness of breath. 150 mL 1  . aspirin 81 MG tablet Take 1 tablet (81 mg total) by mouth daily. 30 tablet 0  . atorvastatin (LIPITOR) 40 MG tablet Take 1 tablet (40 mg total) by mouth daily. 90 tablet 1  . azithromycin (ZITHROMAX) 250 MG tablet Day 1: Take 2 tablets by mouth; Days 2-5: Take 1 tablet by mouth daily 6 tablet 0  . benzonatate (TESSALON PERLES) 100 MG capsule Take 1-2 capsules (100-200 mg total) by mouth 3 (three) times daily as needed. 30 capsule 0  . Blood Glucose Calibration (ACCU-CHEK AVIVA) SOLN 1 each by In Vitro route daily. 1 each 0  . Blood Glucose Monitoring Suppl (ACCU-CHEK AVIVA PLUS) w/Device KIT 1 each by Does not apply route daily. 1 kit 0  . calcium carbonate (OSCAL) 1500 (600 Ca) MG TABS tablet Take 600 mg of elemental calcium by mouth 2 (two) times daily with a meal.    . docusate sodium (COLACE) 100 MG capsule Take 100 mg by mouth daily.    [name] glucose blood (ACCU-CHEK AVIVA PLUS) test strip Use as instructed 100 each 12  .  Lancets (ACCU-CHEK SOFT TOUCH) lancets Use as instructed 100  each 12  . metFORMIN (GLUCOPHAGE-XR) 750 MG 24 hr tablet TAKE 1 TABLET  DAILY WITH BREAKFAST. (Patient taking differently: Give w/food.) 90 tablet 1  . montelukast (SINGULAIR) 10 MG tablet Take 10 mg by mouth at bedtime.    . Multiple Vitamins-Minerals (MULTIVITAL PO) Take 1 tablet by mouth daily.    [name] omeprazole (PRILOSEC) 20 MG capsule Take 1 capsule (20 mg total) by mouth daily. 90 capsule 1  . triamterene-hydrochlorothiazide (DYAZIDE) 37.5-25 MG capsule Take 1 each (1 capsule total) by mouth daily. 90 capsule 1  . umeclidinium-vilanterol (ANORO ELLIPTA) 62.5-25 MCG/INH AEPB Inhale 1 puff into the lungs daily. 180 each 1  . vitamin B-12 (CYANOCOBALAMIN) 100 MCG tablet Take 100 mcg by mouth daily.     No current facility-administered medications on file prior to visit.     There are no Patient Instructions on file for this visit. No follow-ups on file.   Eduardo Honor A Drea Jurewicz, PA-C

## 2018-05-06 ENCOUNTER — Ambulatory Visit: Payer: Self-pay

## 2018-05-06 ENCOUNTER — Telehealth: Payer: Self-pay

## 2018-05-06 DIAGNOSIS — I7090 Unspecified atherosclerosis: Secondary | ICD-10-CM

## 2018-05-06 DIAGNOSIS — E669 Obesity, unspecified: Secondary | ICD-10-CM

## 2018-05-06 DIAGNOSIS — E1169 Type 2 diabetes mellitus with other specified complication: Secondary | ICD-10-CM

## 2018-05-06 DIAGNOSIS — J441 Chronic obstructive pulmonary disease with (acute) exacerbation: Secondary | ICD-10-CM

## 2018-05-06 NOTE — Chronic Care Management (AMB) (Signed)
Chronic Care Management   Outreach Note  05/06/2018 Name: Jhane Vanderzwaag MRN: [id number] DOB: October 30, 1950  Referred by: [name], MD Reason for referral : Chronic Care Management (follow up DM/med assistance)    Ms. Cloe is a 68 y.o.year old femalereferred to Chronic Care Management by Dr. Hoy Register chronic care management and care coordination related to her financial difficulties. Chronic conditions includeHTN, COPD, Morbid Obesity and Arterial Vascular Disease.Last office visit with [name], MDwas 03/09/18.Ms Dileonardo met with the CCM Team on 03/30/18 and goals were established. Today I followed up with Ms. Robers to assess progress towards goals.  2nd unsuccessful telephone outreach attempt to Ms. Nesha Chrisp [place] today. The CM team will reach out to Ms. Luisa Chrisp Roat again over the next 7 days. If we are unable to reach tMs. Minahil Chrisp Medearis by phone on the 3rd attempt, we will discontinue outreach calls but will be available at any time to provide care management services to Ms. Leina Chrisp Dahle.   Follow Up Plan: The CM team will reach out to the patient again over the next 7 days.      Tanasia Budzinski E. Suzie Portela, RN, BSN Nurse Care Coordinator [facility] Care Management  [phone]

## 2018-05-18 ENCOUNTER — Ambulatory Visit: Payer: Self-pay

## 2018-05-18 ENCOUNTER — Telehealth: Payer: Self-pay

## 2018-05-18 DIAGNOSIS — E669 Obesity, unspecified: Secondary | ICD-10-CM

## 2018-05-18 DIAGNOSIS — J441 Chronic obstructive pulmonary disease with (acute) exacerbation: Secondary | ICD-10-CM

## 2018-05-18 DIAGNOSIS — E1169 Type 2 diabetes mellitus with other specified complication: Secondary | ICD-10-CM

## 2018-05-18 NOTE — Chronic Care Management (AMB) (Signed)
Chronic Care Management   Note  05/18/2018 Name: Alexandra Foster MRN: [id number] DOB: 06-Jun-1950  67 y.o. year old female referred to Chronic Care Management team by PCP, Dr.Krichna Sowles for Medication Assistance . Ms Kilkenny met with the CCM Team to establish goals 03/30/18. It has been difficult maintaining contact with Ms. Coste. Third unsuccessful outreach call made today. CCM RN CM again left request for call back with patients husband.   CCM team services are being discontinued due to three unsuccessful outreach attempts.   Patient has been provided CCM contact information if he/she wishes to engage with care managers in the future.     Zach Tietje E. Suzie Portela, RN, BSN Nurse Care Coordinator [facility] / [facility] Care Management  [phone]

## 2018-05-31 ENCOUNTER — Encounter (INDEPENDENT_AMBULATORY_CARE_PROVIDER_SITE_OTHER): Payer: Medicare HMO

## 2018-05-31 ENCOUNTER — Ambulatory Visit (INDEPENDENT_AMBULATORY_CARE_PROVIDER_SITE_OTHER): Payer: Medicare HMO | Admitting: Vascular Surgery

## 2018-05-31 DIAGNOSIS — I7090 Unspecified atherosclerosis: Secondary | ICD-10-CM

## 2018-06-04 ENCOUNTER — Telehealth: Payer: Self-pay

## 2018-06-04 NOTE — Telephone Encounter (Signed)
Copied from [place] [id number]. Topic: Referral - Question >> Jun 03, 2018  2:54 PM Vernona Rieger wrote: Reason for CRM: Darlene with [facility] called and said that they received a request that the office needed a copy of her diabetic eye exam. She said she faxed that back in July and the patient has not been in since. Please advise [phone]

## 2018-06-07 NOTE — Telephone Encounter (Signed)
Spoke with Carlyon Shadow and she will be faxing it over

## 2018-06-10 ENCOUNTER — Encounter: Payer: Self-pay | Admitting: Family Medicine

## 2018-06-24 ENCOUNTER — Other Ambulatory Visit: Payer: Self-pay | Admitting: Family Medicine

## 2018-06-24 DIAGNOSIS — I6529 Occlusion and stenosis of unspecified carotid artery: Secondary | ICD-10-CM

## 2018-06-24 DIAGNOSIS — I10 Essential (primary) hypertension: Secondary | ICD-10-CM

## 2018-06-24 DIAGNOSIS — K219 Gastro-esophageal reflux disease without esophagitis: Secondary | ICD-10-CM

## 2018-06-29 ENCOUNTER — Encounter: Payer: Self-pay | Admitting: Family Medicine

## 2018-06-29 ENCOUNTER — Ambulatory Visit (INDEPENDENT_AMBULATORY_CARE_PROVIDER_SITE_OTHER): Payer: Medicare HMO | Admitting: Family Medicine

## 2018-06-29 ENCOUNTER — Other Ambulatory Visit: Payer: Self-pay

## 2018-06-29 VITALS — BP 140/86 | HR 105 | Temp 99.1°F | Resp 16 | Ht 65.0 in | Wt 265.0 lb

## 2018-06-29 DIAGNOSIS — J411 Mucopurulent chronic bronchitis: Secondary | ICD-10-CM

## 2018-06-29 DIAGNOSIS — E669 Obesity, unspecified: Secondary | ICD-10-CM

## 2018-06-29 DIAGNOSIS — Z23 Encounter for immunization: Secondary | ICD-10-CM

## 2018-06-29 DIAGNOSIS — E1169 Type 2 diabetes mellitus with other specified complication: Secondary | ICD-10-CM

## 2018-06-29 DIAGNOSIS — M170 Bilateral primary osteoarthritis of knee: Secondary | ICD-10-CM | POA: Diagnosis not present

## 2018-06-29 DIAGNOSIS — E785 Hyperlipidemia, unspecified: Secondary | ICD-10-CM

## 2018-06-29 DIAGNOSIS — M62830 Muscle spasm of back: Secondary | ICD-10-CM

## 2018-06-29 DIAGNOSIS — K219 Gastro-esophageal reflux disease without esophagitis: Secondary | ICD-10-CM

## 2018-06-29 DIAGNOSIS — G4733 Obstructive sleep apnea (adult) (pediatric): Secondary | ICD-10-CM | POA: Diagnosis not present

## 2018-06-29 MED ORDER — BACLOFEN 10 MG PO TABS: 10.0000 mg | ORAL_TABLET | Freq: Three times a day (TID) | ORAL | 0 refills | Status: DC

## 2018-06-29 NOTE — Progress Notes (Signed)
Name: Alexandra Foster   MRN: [id number]    DOB: September 26, 1950   Date:06/29/2018       Progress Note  Subjective  Chief Complaint  Chief Complaint  Patient presents with  . Back Pain    HPI  Back pain: she noticed a right upper back pain over the past, described a sore, only present with movement, no rashes, bothers her at night. 7/10 when present. No radiation at this time, she states urine is yellow when she drinks sodas, but clear when drinks water, no urgency, no dysuria or hematuria. She has nocturia but stable. No previous history of kidney stone. Symptoms going on for about one month.   HTN:she is compliant with medication, no proteinuria and is not on ARB or ACE. No chest pain or palpitation.BP is at goal for her. She states at home it is even lower   Dyslipidemia and carotid artery stenosis:she is taking statin therapy. Reviewed labs and LDL is at goal, HDL was low, she has increased fish intake to twice a week, she has been eating tree nuts now.   COPD: she is doing well, last flare was 04/2018 and had to take a Zpack , she has been on Anoro and tolerating medication. . She denies cough or SOB, she has occasional wheezing. She does not smoke, quit 9  years ago  Morbid obesity: she has been obese since reconstructive sinus surgery 12 years ago she had to take steroids and gained weight. Her weight has gone up 9 lbs since Nov . She has co-morbidities, knee OA, HTN, dyslipidemia, DM. Discussed life style modification. She states she had a lot of funerals in the past 4 months and she likes to eat   DM: she had a hgbA1C of 6.6% January 2019 , down to 6.1% January 2020 , she denies polyphagia, polydipsia but she has nocturia.She likes sweets, likes to eat and has been drinking sodas.She states glucose at home has been controlled 90's-114/ . She went to one class of diabetic teaching class lasin 2019.  She iis up to date with foot exam, eye exam due April 2020.She is on Metformin and  denies side effects.   GERD: discussed risk associated with long term PPI, she is doing well on Omeprazole, no heartburn or indigestion as long as she takes medication.   OSA: sees neurologist every 6 months , very compliant with CPAP machine, she wakes feeling rested, no longer snoring at night. Echo showed enlarged right atrium before starting on CPAP. She states edema controlled with CPAP.Unchanged   OAboth knees:no instability, pain is aching like and only when walking, no effusion or redness. Discussed Tylenol prn   Carotid artery disease: doppler done 2015, discussed repeating test and she is willing to go , on aspirin and statin therapy Unchanged, reviewed labs   Patient Active Problem List   Diagnosis Date Noted  . Bilateral lower extremity edema 05/03/2018  . Lower extremity pain, bilateral 05/03/2018  . Diabetes mellitus type 2 in obese ([place]) 04/23/2018  . Osteoarthritis of both knees 01/14/2018  . Urge incontinence 03/14/2016  . Osteopenia 02/13/2016  . OSA (obstructive sleep apnea) 01/08/2016  . Allergic rhinitis 04/13/2015  . Arterial vascular disease 09/18/2014  . Carotid artery plaque 09/18/2014  . COPD (chronic obstructive pulmonary disease) ([place]) 09/18/2014  . Morbid obesity ([place]) 09/18/2014  . Esophagitis, reflux 09/18/2014  . Essential hypertension 09/21/2006    Past Surgical History:  Procedure Laterality Date  . ABDOMINAL HYSTERECTOMY    .  COLONOSCOPY  10/29/2006   Dr Allen Norris  . COLONOSCOPY WITH PROPOFOL N/A 11/05/2016   Procedure: COLONOSCOPY WITH PROPOFOL;  Surgeon: Robert Bellow, MD;  Location: [facility] ENDOSCOPY;  Service: Endoscopy;  Laterality: N/A;  . NASAL SINUS SURGERY  2002   Dr Carlis Abbott    Family History  Problem Relation Age of Onset  . Congestive Heart Failure Mother   . Coronary artery disease Father   . Breast cancer Sister 78  . Heart disease Brother   . Varicose Veins Brother   . Alcohol abuse Brother     Social History    Socioeconomic History  . Marital status: Married    Spouse name: Trilby Drummer  . Number of children: 1  . Years of education: 77  . Highest education level: 12th grade  Occupational History    Employer: RETIRED  Social Needs  . Financial resource strain: Not hard at all  . Food insecurity:    Worry: Never true    Inability: Never true  . Transportation needs:    Medical: No    Non-medical: No  Tobacco Use  . Smoking status: Former Smoker    Packs/day: 2.00    Years: 40.00    Pack years: 80.00    Types: Cigarettes    Last attempt to quit: 2008    Years since quitting: 12.2  . Smokeless tobacco: Former Systems developer    Types: Snuff    Quit date: 04/2001  . Tobacco comment: smoking cessation materials not required  Substance and Sexual Activity  . Alcohol use: No    Alcohol/week: 0.0 standard drinks  . Drug use: No  . Sexual activity: Not Currently  Lifestyle  . Physical activity:    Days per week: 0 days    Minutes per session: 0 min  . Stress: Not at all  Relationships  . Social connections:    Talks on phone: More than three times a week    Gets together: More than three times a week    Attends religious service: 1 to 4 times per year    Active member of club or organization: No    Attends meetings of clubs or organizations: Never    Relationship status: Married  . Intimate partner violence:    Fear of current or ex partner: No    Emotionally abused: No    Physically abused: No    Forced sexual activity: No  Other Topics Concern  . Not on file  Social History Narrative  . Not on file     Current Outpatient Medications:  .  albuterol (PROVENTIL HFA;VENTOLIN HFA) 108 (90 Base) MCG/ACT inhaler, Inhale 1-2 puffs into the lungs every 4 (four) hours as needed for wheezing or shortness of breath., Disp: 1 Inhaler, Rfl: 2 .  albuterol (PROVENTIL) (2.5 MG/3ML) 0.083% nebulizer solution, Take 3 mLs (2.5 mg total) by nebulization every 6 (six) hours as needed for wheezing or  shortness of breath., Disp: 150 mL, Rfl: 1 .  aspirin 81 MG tablet, Take 1 tablet (81 mg total) by mouth daily., Disp: 30 tablet, Rfl: 0 .  atorvastatin (LIPITOR) 40 MG tablet, TAKE 1 TABLET EVERY DAY, Disp: 90 tablet, Rfl: 1 .  baclofen (LIORESAL) 10 MG tablet, Take 1 tablet (10 mg total) by mouth 3 (three) times daily., Disp: 30 each, Rfl: 0 .  Blood Glucose Calibration (ACCU-CHEK AVIVA) SOLN, 1 each by In Vitro route daily., Disp: 1 each, Rfl: 0 .  Blood Glucose Monitoring Suppl (ACCU-CHEK AVIVA PLUS) w/Device  KIT, 1 each by Does not apply route daily., Disp: 1 kit, Rfl: 0 .  calcium carbonate (OSCAL) 1500 (600 Ca) MG TABS tablet, Take 600 mg of elemental calcium by mouth 2 (two) times daily with a meal., Disp: , Rfl:  .  docusate sodium (COLACE) 100 MG capsule, Take 100 mg by mouth daily., Disp: , Rfl:  .  glucose blood (ACCU-CHEK AVIVA PLUS) test strip, Use as instructed, Disp: 100 each, Rfl: 12 .  Lancets (ACCU-CHEK SOFT TOUCH) lancets, Use as instructed, Disp: 100 each, Rfl: 12 .  metFORMIN (GLUCOPHAGE-XR) 750 MG 24 hr tablet, TAKE 1 TABLET  DAILY WITH BREAKFAST. (Patient taking differently: Give w/food.), Disp: 90 tablet, Rfl: 1 .  montelukast (SINGULAIR) 10 MG tablet, Take 10 mg by mouth at bedtime., Disp: , Rfl:  .  Multiple Vitamins-Minerals (MULTIVITAL PO), Take 1 tablet by mouth daily., Disp: , Rfl:  .  omeprazole (PRILOSEC) 20 MG capsule, TAKE 1 CAPSULE EVERY DAY, Disp: 90 capsule, Rfl: 1 .  triamterene-hydrochlorothiazide (DYAZIDE) 37.5-25 MG capsule, TAKE 1 CAPSULE EVERY DAY, Disp: 90 capsule, Rfl: 1 .  umeclidinium-vilanterol (ANORO ELLIPTA) 62.5-25 MCG/INH AEPB, Inhale 1 puff into the lungs daily., Disp: 180 each, Rfl: 1 .  vitamin B-12 (CYANOCOBALAMIN) 100 MCG tablet, Take 100 mcg by mouth daily., Disp: , Rfl:   No Known Allergies  I personally reviewed active problem list, medication list, allergies, family history, social history, health maintenance with the patient/caregiver  today.   ROS  Constitutional: Negative for fever , positive for weight change - 9 lbs .  Respiratory: Negative for cough and shortness of breath.   Cardiovascular: Negative for chest pain or palpitations.  Gastrointestinal: Negative for abdominal pain, no bowel changes.  Musculoskeletal: positive  for gait problem but no  joint swelling.  Skin: Negative for rash.  Neurological: Negative for dizziness or headache.  No other specific complaints in a complete review of systems (except as listed in HPI above).  Objective  Vitals:   06/29/18 1545  BP: 140/86  Pulse: (!) 105  Resp: 16  Temp: 99.1 F (37.3 C)  TempSrc: Oral  SpO2: 98%  Weight: 265 lb (120.2 kg)  Height: '5\' 5"'  (1.651 m)    Body mass index is 44.1 kg/m.  Physical Exam  Constitutional: Patient appears well-developed and well-nourished. Obese No distress.  HEENT: head atraumatic, normocephalic, pupils equal and reactive to light, neck supple, throat within normal limits Cardiovascular: Normal rate, regular rhythm and normal heart sounds.  No murmur heard. No BLE edema. Pulmonary/Chest: Effort normal and breath sounds normal. No respiratory distress. Abdominal: Soft.  There is no tenderness. Negative CVA tenderness Muscular Skeletal: pain during palpation of right upper back, not over spinal processes no rashes Skin: she has a black head pea size, no signs of infection above bra line  Psychiatric: Patient has a normal mood and affect. behavior is normal. Judgment and thought content normal.  Recent Results (from the past 2160 hour(s))  Lipid panel     Status: Abnormal   Collection Time: 04/23/18  8:01 AM  Result Value Ref Range   Cholesterol 96 <200 mg/dL   HDL 33 (L) >50 mg/dL   Triglycerides 72 <150 mg/dL   LDL Cholesterol (Calc) 48 mg/dL (calc)    Comment: Reference range: <100 . Desirable range <100 mg/dL for primary prevention;   <70 mg/dL for patients with CHD or diabetic patients  with > or = 2 CHD  risk factors. [name] LDL-C is now calculated using the  Martin-Hopkins  calculation, which is a validated novel method providing  better accuracy than the Friedewald equation in the  estimation of LDL-C.  Cresenciano Genre et al. Annamaria Helling. 8453;646(80): 2061-2068  (http://education.[facility].com/faq/FAQ164)    Total CHOL/HDL Ratio 2.9 <5.0 (calc)   Non-HDL Cholesterol (Calc) 63 <130 mg/dL (calc)    Comment: For patients with diabetes plus 1 major ASCVD risk  factor, treating to a non-HDL-C goal of <100 mg/dL  (LDL-C of <70 mg/dL) is considered a therapeutic  option.   CBC with Differential/Platelet     Status: Abnormal   Collection Time: 04/23/18  8:01 AM  Result Value Ref Range   WBC 7.1 3.8 - 10.8 Thousand/uL   RBC 4.93 3.80 - 5.10 Million/uL   Hemoglobin 12.2 11.7 - 15.5 g/dL   HCT 38.6 35.0 - 45.0 %   MCV 78.3 (L) 80.0 - 100.0 fL   MCH 24.7 (L) 27.0 - 33.0 pg   MCHC 31.6 (L) 32.0 - 36.0 g/dL   RDW 13.1 11.0 - 15.0 %   Platelets 366 140 - 400 Thousand/uL   MPV 10.7 7.5 - 12.5 fL   Neutro Abs 4,530 1,500 - 7,800 cells/uL   Lymphs Abs 1,732 850 - 3,900 cells/uL   Absolute Monocytes 362 200 - 950 cells/uL   Eosinophils Absolute 433 15 - 500 cells/uL   Basophils Absolute 43 0 - 200 cells/uL   Neutrophils Relative % 63.8 %   Total Lymphocyte 24.4 %   Monocytes Relative 5.1 %   Eosinophils Relative 6.1 %   Basophils Relative 0.6 %  Iron, TIBC and Ferritin Panel     Status: Abnormal   Collection Time: 04/23/18  8:01 AM  Result Value Ref Range   Iron 42 (L) 45 - 160 mcg/dL   TIBC 374 250 - 450 mcg/dL (calc)   %SAT 11 (L) 16 - 45 % (calc)   Ferritin 23 16 - 288 ng/mL  Hemoglobin A1c     Status: Abnormal   Collection Time: 04/23/18  8:01 AM  Result Value Ref Range   Hgb A1c MFr Bld 6.1 (H) <5.7 % of total Hgb    Comment: For someone without known diabetes, a hemoglobin  A1c value between 5.7% and 6.4% is consistent with prediabetes and should be confirmed with a  follow-up  test. . For someone with known diabetes, a value <7% indicates that their diabetes is well controlled. A1c targets should be individualized based on duration of diabetes, age, comorbid conditions, and other considerations. . This assay result is consistent with an increased risk of diabetes. . Currently, no consensus exists regarding use of hemoglobin A1c for diagnosis of diabetes for children. .    Mean Plasma Glucose 128 (calc)   eAG (mmol/L) 7.1 (calc)      PHQ2/9: Depression screen [facility] 2/9 06/29/2018 04/23/2018 04/22/2018 03/09/2018 01/14/2018  Decreased Interest 0 0 0 2 0  Down, Depressed, Hopeless 0 0 0 0 0  PHQ - 2 Score 0 0 0 2 0  Altered sleeping 0 0 - 0 -  Tired, decreased energy 0 1 - 2 -  Change in appetite 0 0 - 0 -  Feeling bad or failure about yourself  0 0 - 0 -  Trouble concentrating 0 0 - 0 -  Moving slowly or fidgety/restless 0 0 - 0 -  Suicidal thoughts 0 0 - 0 -  PHQ-9 Score 0 1 - 4 -  Difficult doing work/chores - Not difficult at all - Not difficult at all -  phq 9 negative  Fall Risk: Fall Risk  06/29/2018 04/23/2018 04/22/2018 01/14/2018 11/17/2017  Falls in the past year? 0 0 0 No No  Number falls in past yr: 0 0 0 - -  Injury with Fall? 0 0 - - -  Follow up - Falls evaluation completed - - -     Assessment & Plan  1. Diabetes mellitus type 2 in obese [facility])  Doing well, continue medication   2. OSA (obstructive sleep apnea)  Compliant continue CPAP   3. Mucopurulent chronic bronchitis ([facility])  Continue Anoro  4. Morbid obesity ([place])  Needs to stop sodas, avoid sweets and exercise more   5. Dyslipidemia  On statin therapy   6. Dyslipidemia associated with type 2 diabetes mellitus ([place])  On statin, last A1C is at goal   7. Primary osteoarthritis of both knees   8. Gastroesophageal reflux disease without esophagitis  Under control   9. Need for vaccination for pneumococcus  She agrees on getting it today   10. Back muscle  spasm  - baclofen (LIORESAL) 10 MG tablet; Take 1 tablet (10 mg total) by mouth 3 (three) times daily.  Dispense: 30 each; Refill: 0

## 2018-07-06 ENCOUNTER — Other Ambulatory Visit: Payer: Medicare HMO

## 2018-07-08 ENCOUNTER — Encounter (INDEPENDENT_AMBULATORY_CARE_PROVIDER_SITE_OTHER): Payer: Self-pay | Admitting: Vascular Surgery

## 2018-07-08 NOTE — Progress Notes (Signed)
Patient was a no-show 

## 2018-07-13 ENCOUNTER — Ambulatory Visit: Payer: Medicare HMO | Admitting: Family Medicine

## 2018-08-12 ENCOUNTER — Other Ambulatory Visit: Payer: Medicare HMO

## 2018-10-12 ENCOUNTER — Ambulatory Visit: Payer: Medicare HMO | Admitting: Family Medicine

## 2018-11-17 ENCOUNTER — Ambulatory Visit
Admission: RE | Admit: 2018-11-17 | Discharge: 2018-11-17 | Disposition: A | Discharge status: Home / Self Care | Payer: Medicare HMO | Source: Ambulatory Visit | Attending: Family Medicine | Admitting: Family Medicine

## 2018-11-17 ENCOUNTER — Other Ambulatory Visit: Payer: Self-pay

## 2018-11-17 DIAGNOSIS — M8580 Other specified disorders of bone density and structure, unspecified site: Secondary | ICD-10-CM | POA: Insufficient documentation

## 2018-11-17 DIAGNOSIS — Z1231 Encounter for screening mammogram for malignant neoplasm of breast: Secondary | ICD-10-CM | POA: Insufficient documentation

## 2018-11-17 DIAGNOSIS — Z780 Asymptomatic menopausal state: Secondary | ICD-10-CM | POA: Insufficient documentation

## 2018-11-17 DIAGNOSIS — M85851 Other specified disorders of bone density and structure, right thigh: Secondary | ICD-10-CM | POA: Diagnosis not present

## 2018-11-24 ENCOUNTER — Ambulatory Visit: Payer: Medicare HMO | Admitting: Family Medicine

## 2018-12-15 ENCOUNTER — Ambulatory Visit (INDEPENDENT_AMBULATORY_CARE_PROVIDER_SITE_OTHER): Payer: Medicare HMO | Admitting: Family Medicine

## 2018-12-15 ENCOUNTER — Encounter: Payer: Self-pay | Admitting: Family Medicine

## 2018-12-15 VITALS — BP 120/87 | Temp 97.2°F | Wt 259.0 lb

## 2018-12-15 DIAGNOSIS — M170 Bilateral primary osteoarthritis of knee: Secondary | ICD-10-CM

## 2018-12-15 DIAGNOSIS — I10 Essential (primary) hypertension: Secondary | ICD-10-CM | POA: Diagnosis not present

## 2018-12-15 DIAGNOSIS — E785 Hyperlipidemia, unspecified: Secondary | ICD-10-CM

## 2018-12-15 DIAGNOSIS — J411 Mucopurulent chronic bronchitis: Secondary | ICD-10-CM

## 2018-12-15 DIAGNOSIS — E611 Iron deficiency: Secondary | ICD-10-CM | POA: Diagnosis not present

## 2018-12-15 DIAGNOSIS — E669 Obesity, unspecified: Secondary | ICD-10-CM

## 2018-12-15 DIAGNOSIS — K219 Gastro-esophageal reflux disease without esophagitis: Secondary | ICD-10-CM

## 2018-12-15 DIAGNOSIS — G4733 Obstructive sleep apnea (adult) (pediatric): Secondary | ICD-10-CM

## 2018-12-15 DIAGNOSIS — I6529 Occlusion and stenosis of unspecified carotid artery: Secondary | ICD-10-CM | POA: Diagnosis not present

## 2018-12-15 DIAGNOSIS — E1169 Type 2 diabetes mellitus with other specified complication: Secondary | ICD-10-CM | POA: Diagnosis not present

## 2018-12-15 NOTE — Progress Notes (Signed)
Name: Alexandra Foster   MRN: [id number]    DOB: [birth year]   Date:12/15/2018       Progress Note  Subjective  Chief Complaint  Chief Complaint  Patient presents with  . Medication Refill  . Diabetes    Checks daily Average-104   . Hypertension    Denies any symptoms  . Dyslipidemia  . COPD  . Morbid Obesity  . Sleep Apnea    Stays her CPAP machine is leaking and needs a part fixed on it.  Sleeping around 12 hours nightly on the machine  . Gastroesophageal Reflux    I connected with  Judith Blonder Yarbro on 12/15/18 at  3:00 PM EDT by telephone and verified that I am speaking with the correct person using two identifiers.  I discussed the limitations, risks, security and privacy concerns of performing an evaluation and management service by telephone and the availability of in person appointments. Staff also discussed with the patient that there may be a patient responsible charge related to this service. Patient Location: at work  Provider Location: [facility]   HPI  HTN:she is compliant with medication, no proteinuria and is not on ARB or ACE.No chest pain or palpitation.She states she is feeling well   Dyslipidemia and carotid artery stenosis:she is taking statin therapy. Reviewed labs and LDL was 48 HDL was low, she has increased fish at least twice a week, she has been eating tree nuts now.   COPD: she is doing well, last flare was 04/2018 and had to take a Zpack , she has been on Anoro and tolerating medication.  She denies cough or SOB, she has occasional wheezing. She does not smoke, she quit smoking many years ago.   Morbid obesity: she has been obese since reconstructive sinus surgery 12 years ago she had to take steroids and gained weight. . She has co-morbidities, knee OA, HTN, dyslipidemia, DM. Discussed life style modification.She is still walking daily for about 20 minutes   DM: she had a hgbA1C of 6.6%January 2019, down to 6.1%  January 2020 , she denies polyphagia, polydipsia but she has nocturia.She likes sweets, likes to eat and has been drinking sodas.She states glucose at home has been controlled 114-124 .She went to one class of diabetic teaching class last year Denies hypoglycemic episodes   GERD: discussed risk associated with long term PPI, she is doing well on Omeprazole, no heartburn or indigestion as long as she takes medication. Unchanged   OSA: sees neurologist every 6 months , very compliant with CPAP machine, she wakes feeling rested, no longer snoring at night. Echo showed enlarged right atrium before starting on CPAP. She states only has edema if sitting up all night   OAboth knees:no instability, pain is aching like and only when walking, no effusion or redness. She states she does not worry about it   Carotid artery disease: doppler done 2015, discussed repeating testand she is willing to go, on aspirin and statin therapy, she has not been back in years but does not want to go   Patient Active Problem List   Diagnosis Date Noted  . Bilateral lower extremity edema 05/03/2018  . Lower extremity pain, bilateral 05/03/2018  . Diabetes mellitus type 2 in obese ([place]) 04/23/2018  . Osteoarthritis of both knees 01/14/2018  . Urge incontinence 03/14/2016  . Osteopenia 02/13/2016  . OSA (obstructive sleep apnea) 01/08/2016  . Allergic rhinitis 04/13/2015  . Arterial vascular disease 09/18/2014  . Carotid artery  plaque 09/18/2014  . COPD (chronic obstructive pulmonary disease) ([place]) 09/18/2014  . Morbid obesity ([place]) 09/18/2014  . Esophagitis, reflux 09/18/2014  . Essential hypertension 09/21/2006    Past Surgical History:  Procedure Laterality Date  . ABDOMINAL HYSTERECTOMY    . COLONOSCOPY  10/29/2006   Dr Allen Norris  . COLONOSCOPY WITH PROPOFOL N/A 11/05/2016   Procedure: COLONOSCOPY WITH PROPOFOL;  Surgeon: Robert Bellow, MD;  Location: [facility] ENDOSCOPY;  Service: Endoscopy;  Laterality:  N/A;  . NASAL SINUS SURGERY  2002   Dr Carlis Abbott    Family History  Problem Relation Age of Onset  . Congestive Heart Failure Mother   . Coronary artery disease Father   . Breast cancer Sister 34  . Heart disease Brother   . Varicose Veins Brother   . Alcohol abuse Brother     Social History   Socioeconomic History  . Marital status: Married    Spouse name: Trilby Drummer  . Number of children: 1  . Years of education: 42  . Highest education level: 12th grade  Occupational History    Employer: RETIRED  Social Needs  . Financial resource strain: Not hard at all  . Food insecurity    Worry: Never true    Inability: Never true  . Transportation needs    Medical: No    Non-medical: No  Tobacco Use  . Smoking status: Former Smoker    Packs/day: 2.00    Years: 40.00    Pack years: 80.00    Types: Cigarettes    Quit date: 2008    Years since quitting: 12.6  . Smokeless tobacco: Former Systems developer    Types: Snuff    Quit date: 04/2001  . Tobacco comment: smoking cessation materials not required  Substance and Sexual Activity  . Alcohol use: No    Alcohol/week: 0.0 standard drinks  . Drug use: No  . Sexual activity: Not Currently  Lifestyle  . Physical activity    Days per week: 0 days    Minutes per session: 0 min  . Stress: Not at all  Relationships  . Social connections    Talks on phone: More than three times a week    Gets together: More than three times a week    Attends religious service: 1 to 4 times per year    Active member of club or organization: No    Attends meetings of clubs or organizations: Never    Relationship status: Married  . Intimate partner violence    Fear of current or ex partner: No    Emotionally abused: No    Physically abused: No    Forced sexual activity: No  Other Topics Concern  . Not on file  Social History Narrative  . Not on file     Current Outpatient Medications:  .  albuterol (PROVENTIL HFA;VENTOLIN HFA) 108 (90 Base) MCG/ACT  inhaler, Inhale 1-2 puffs into the lungs every 4 (four) hours as needed for wheezing or shortness of breath., Disp: 1 Inhaler, Rfl: 2 .  albuterol (PROVENTIL) (2.5 MG/3ML) 0.083% nebulizer solution, Take 3 mLs (2.5 mg total) by nebulization every 6 (six) hours as needed for wheezing or shortness of breath., Disp: 150 mL, Rfl: 1 .  aspirin 81 MG tablet, Take 1 tablet (81 mg total) by mouth daily., Disp: 30 tablet, Rfl: 0 .  atorvastatin (LIPITOR) 40 MG tablet, TAKE 1 TABLET EVERY DAY, Disp: 90 tablet, Rfl: 1 .  Blood Glucose Calibration (ACCU-CHEK AVIVA) SOLN, 1 each by  In Vitro route daily., Disp: 1 each, Rfl: 0 .  Blood Glucose Monitoring Suppl (ACCU-CHEK AVIVA PLUS) w/Device KIT, 1 each by Does not apply route daily., Disp: 1 kit, Rfl: 0 .  calcium carbonate (OSCAL) 1500 (600 Ca) MG TABS tablet, Take 600 mg of elemental calcium by mouth 2 (two) times daily with a meal., Disp: , Rfl:  .  docusate sodium (COLACE) 100 MG capsule, Take 100 mg by mouth daily., Disp: , Rfl:  .  glucose blood (ACCU-CHEK AVIVA PLUS) test strip, Use as instructed, Disp: 100 each, Rfl: 12 .  Lancets (ACCU-CHEK SOFT TOUCH) lancets, Use as instructed, Disp: 100 each, Rfl: 12 .  metFORMIN (GLUCOPHAGE-XR) 750 MG 24 hr tablet, TAKE 1 TABLET  DAILY WITH BREAKFAST. (Patient taking differently: Give w/food.), Disp: 90 tablet, Rfl: 1 .  montelukast (SINGULAIR) 10 MG tablet, Take 10 mg by mouth at bedtime., Disp: , Rfl:  .  Multiple Vitamins-Minerals (MULTIVITAL PO), Take 1 tablet by mouth daily., Disp: , Rfl:  .  omeprazole (PRILOSEC) 20 MG capsule, TAKE 1 CAPSULE EVERY DAY, Disp: 90 capsule, Rfl: 1 .  triamterene-hydrochlorothiazide (DYAZIDE) 37.5-25 MG capsule, TAKE 1 CAPSULE EVERY DAY, Disp: 90 capsule, Rfl: 1 .  umeclidinium-vilanterol (ANORO ELLIPTA) 62.5-25 MCG/INH AEPB, Inhale 1 puff into the lungs daily., Disp: 180 each, Rfl: 1 .  vitamin B-12 (CYANOCOBALAMIN) 100 MCG tablet, Take 100 mcg by mouth daily., Disp: , Rfl:   No  Known Allergies  I personally reviewed active problem list, medication list, allergies, family history, social history, health maintenance with the patient/caregiver today.   ROS  Ten systems reviewed and is negative except as mentioned in HPI  " I am feeling great "  Objective  Virtual encounter, vitals at home  Vitals:   12/15/18 1405  BP: 120/87  Temp: (!) 97.2 F (36.2 C)    Body mass index is 43.1 kg/m.  Physical Exam  Awake, alert and oriented, and very cheerful on the phone  PHQ2/9: Depression screen [facility] 2/9 12/15/2018 06/29/2018 04/23/2018 04/22/2018 03/09/2018  Decreased Interest 0 0 0 0 2  Down, Depressed, Hopeless 0 0 0 0 0  PHQ - 2 Score 0 0 0 0 2  Altered sleeping 0 0 0 - 0  Tired, decreased energy 0 0 1 - 2  Change in appetite 0 0 0 - 0  Feeling bad or failure about yourself  0 0 0 - 0  Trouble concentrating 0 0 0 - 0  Moving slowly or fidgety/restless 0 0 0 - 0  Suicidal thoughts 0 0 0 - 0  PHQ-9 Score 0 0 1 - 4  Difficult doing work/chores Not difficult at all - Not difficult at all - Not difficult at all   PHQ-2/9 Result is negative.    Fall Risk: Fall Risk  12/15/2018 06/29/2018 04/23/2018 04/22/2018 01/14/2018  Falls in the past year? 0 0 0 0 No  Number falls in past yr: 0 0 0 0 -  Injury with Fall? 0 0 0 - -  Follow up - - Falls evaluation completed - -    Assessment & Plan  1. Diabetes mellitus type 2 in obese (H[facility])  - Hemoglobin A1c - Microalbumin / creatinine urine ratio  2. Carotid artery plaque, unspecified laterality   3. Essential hypertension  - COMPLETE METABOLIC PANEL WITH GFR - CBC with Differential/Platelet  4. Gastroesophageal reflux disease without esophagitis  Under control with low dose PPI  5. OSA (obstructive sleep apnea)  compliant  6.  Mucopurulent chronic bronchitis ([place])  Not currently taking medication daily but states feeling better  7. Primary osteoarthritis of both knees  Continue daily walks  8.  Dyslipidemia associated with type 2 diabetes mellitus ([place])   9. Morbid obesity ([place])  Discussed with the patient the risk posed by an increased BMI. Discussed importance of portion control, calorie counting and at least 150 minutes of physical activity weekly. Avoid sweet beverages and drink more water. Eat at least 6 servings of fruit and vegetables daily   10. Iron deficiency  - Iron, TIBC and Ferritin Panel  I discussed the assessment and treatment plan with the patient. The patient was provided an opportunity to ask questions and all were answered. The patient agreed with the plan and demonstrated an understanding of the instructions.   The patient was advised to call back or seek an in-person evaluation if the symptoms worsen or if the condition fails to improve as anticipated.  I provided 25 minutes of non-face-to-face time during this encounter.  Loistine Chance, MD

## 2018-12-16 ENCOUNTER — Other Ambulatory Visit: Payer: Self-pay | Admitting: Family Medicine

## 2018-12-16 DIAGNOSIS — K219 Gastro-esophageal reflux disease without esophagitis: Secondary | ICD-10-CM

## 2018-12-16 DIAGNOSIS — E669 Obesity, unspecified: Secondary | ICD-10-CM

## 2018-12-16 DIAGNOSIS — E1169 Type 2 diabetes mellitus with other specified complication: Secondary | ICD-10-CM

## 2019-01-12 ENCOUNTER — Other Ambulatory Visit: Payer: Self-pay | Admitting: Family Medicine

## 2019-02-17 DIAGNOSIS — E113393 Type 2 diabetes mellitus with moderate nonproliferative diabetic retinopathy without macular edema, bilateral: Secondary | ICD-10-CM | POA: Diagnosis not present

## 2019-02-18 ENCOUNTER — Telehealth: Payer: Self-pay

## 2019-02-18 NOTE — Telephone Encounter (Signed)
Pt called back, requesting return call. Please advise

## 2019-02-18 NOTE — Telephone Encounter (Signed)
Called and spoke with patient. I also called and spoke with [employer]. I informed the patient that she needs to call [phone] option 1 for DME and speak with respiratory therapist and explain what is going on with her machine. Patient verbalized understanding.

## 2019-02-18 NOTE — Telephone Encounter (Signed)
Left message Dr. Ancil Boozer gave patient a prescription for a new CPAP. It is up front and patient can take it to her supplier.

## 2019-02-18 NOTE — Telephone Encounter (Signed)
Copied from [place] [id number]. Topic: General - Other >> Feb 18, 2019  9:06 AM Pauline Good wrote: Reason for CRM: pt calling and wanting to know status of her getting a new sleep machine. Please call pt to advise because she really need it

## 2019-02-18 NOTE — Telephone Encounter (Signed)
Copied from [place] [id number]. Topic: General - Inquiry >> Feb 16, 2019 11:18 AM Alexandra Foster wrote: Reason for CRM:   Pt states that she has a sleep machine and the water is leaking from it.  She wants to know what she needs to do to get another sleep machine. Pt can be reached at [phone]

## 2019-02-22 ENCOUNTER — Ambulatory Visit (INDEPENDENT_AMBULATORY_CARE_PROVIDER_SITE_OTHER): Payer: Medicare HMO

## 2019-02-22 ENCOUNTER — Other Ambulatory Visit: Payer: Self-pay

## 2019-02-22 DIAGNOSIS — Z23 Encounter for immunization: Secondary | ICD-10-CM | POA: Diagnosis not present

## 2019-02-23 DIAGNOSIS — E669 Obesity, unspecified: Secondary | ICD-10-CM | POA: Diagnosis not present

## 2019-02-23 DIAGNOSIS — E1169 Type 2 diabetes mellitus with other specified complication: Secondary | ICD-10-CM | POA: Diagnosis not present

## 2019-02-23 DIAGNOSIS — G4733 Obstructive sleep apnea (adult) (pediatric): Secondary | ICD-10-CM | POA: Diagnosis not present

## 2019-02-23 DIAGNOSIS — I10 Essential (primary) hypertension: Secondary | ICD-10-CM | POA: Diagnosis not present

## 2019-02-23 DIAGNOSIS — E611 Iron deficiency: Secondary | ICD-10-CM | POA: Diagnosis not present

## 2019-02-24 LAB — CBC WITH DIFFERENTIAL/PLATELET
Absolute Monocytes: 400 cells/uL (ref 200–950)
Basophils Absolute: 41 cells/uL (ref 0–200)
Basophils Relative: 0.6 %
Eosinophils Absolute: 373 cells/uL (ref 15–500)
Eosinophils Relative: 5.4 %
HCT: 40.5 % (ref 35.0–45.0)
Hemoglobin: 12.4 g/dL (ref 11.7–15.5)
Lymphs Abs: 1497 cells/uL (ref 850–3900)
MCH: 24.1 pg — ABNORMAL LOW (ref 27.0–33.0)
MCHC: 30.6 g/dL — ABNORMAL LOW (ref 32.0–36.0)
MCV: 78.8 fL — ABNORMAL LOW (ref 80.0–100.0)
MPV: 10.7 fL (ref 7.5–12.5)
Monocytes Relative: 5.8 %
Neutro Abs: 4589 cells/uL (ref 1500–7800)
Neutrophils Relative %: 66.5 %
Platelets: 382 10*3/uL (ref 140–400)
RBC: 5.14 10*6/uL — ABNORMAL HIGH (ref 3.80–5.10)
RDW: 13 % (ref 11.0–15.0)
Total Lymphocyte: 21.7 %
WBC: 6.9 10*3/uL (ref 3.8–10.8)

## 2019-02-24 LAB — COMPLETE METABOLIC PANEL WITH GFR
AG Ratio: 1.5 (calc) (ref 1.0–2.5)
ALT: 13 U/L (ref 6–29)
AST: 16 U/L (ref 10–35)
Albumin: 3.8 g/dL (ref 3.6–5.1)
Alkaline phosphatase (APISO): 84 U/L (ref 37–153)
BUN: 20 mg/dL (ref 7–25)
CO2: 31 mmol/L (ref 20–32)
Calcium: 9.3 mg/dL (ref 8.6–10.4)
Chloride: 106 mmol/L (ref 98–110)
Creat: 0.86 mg/dL (ref 0.60–0.93)
GFR, Est African American: 79 mL/min/{1.73_m2} (ref >=60)
GFR, Est Non African American: 68 mL/min/{1.73_m2} (ref >=60)
Globulin: 2.6 g/dL (calc) (ref 1.9–3.7)
Glucose, Bld: 113 mg/dL — ABNORMAL HIGH (ref 65–99)
Potassium: 3.8 mmol/L (ref 3.5–5.3)
Sodium: 147 mmol/L — ABNORMAL HIGH (ref 135–146)
Total Bilirubin: 0.5 mg/dL (ref 0.2–1.2)
Total Protein: 6.4 g/dL (ref 6.1–8.1)

## 2019-02-24 LAB — HEMOGLOBIN A1C
Hgb A1c MFr Bld: 6.2 % of total Hgb — ABNORMAL HIGH (ref <5.7)
Mean Plasma Glucose: 131 (calc)
eAG (mmol/L): 7.3 (calc)

## 2019-02-24 LAB — IRON,TIBC AND FERRITIN PANEL
%SAT: 17 % (calc) (ref 16–45)
Ferritin: 19 ng/mL (ref 16–288)
Iron: 64 ug/dL (ref 45–160)
TIBC: 368 mcg/dL (calc) (ref 250–450)

## 2019-02-24 LAB — MICROALBUMIN / CREATININE URINE RATIO
Creatinine, Urine: 200 mg/dL (ref 20–275)
Microalb Creat Ratio: 4 mcg/mg creat (ref <30)
Microalb, Ur: 0.8 mg/dL

## 2019-03-02 ENCOUNTER — Other Ambulatory Visit: Payer: Self-pay | Admitting: Family Medicine

## 2019-03-02 DIAGNOSIS — I10 Essential (primary) hypertension: Secondary | ICD-10-CM

## 2019-03-03 NOTE — Telephone Encounter (Signed)
Alexandra Foster, with [facility], states they received order for CPAP, however it did not include pressures. She is requesting call back.

## 2019-03-07 ENCOUNTER — Other Ambulatory Visit: Payer: Self-pay | Admitting: Family Medicine

## 2019-03-07 DIAGNOSIS — J441 Chronic obstructive pulmonary disease with (acute) exacerbation: Secondary | ICD-10-CM

## 2019-03-07 NOTE — Telephone Encounter (Signed)
Spoke with [facility] and they want notes and pressure settings. Since [facility] perform and handle their CPAP [employer] advised to cancel the order from [place]. Then have the patient call [facility] to place order for new CPAP machine. Left detailed message with patient husband and cancelled our order with [employer].

## 2019-03-22 ENCOUNTER — Other Ambulatory Visit: Payer: Self-pay | Admitting: Family Medicine

## 2019-03-22 DIAGNOSIS — I6529 Occlusion and stenosis of unspecified carotid artery: Secondary | ICD-10-CM

## 2019-04-27 ENCOUNTER — Telehealth: Payer: Self-pay | Admitting: Family Medicine

## 2019-04-27 NOTE — Telephone Encounter (Signed)
Pt has been around 3 people that was positive for covid and she is asking to be sent for covid test and she has symptoms of cough, fatigue, cough is very bad and wants cogh meds not the pearls. Pharm is [facility] on [address].

## 2019-04-28 ENCOUNTER — Ambulatory Visit (INDEPENDENT_AMBULATORY_CARE_PROVIDER_SITE_OTHER): Payer: Medicare HMO | Admitting: Family Medicine

## 2019-04-28 ENCOUNTER — Ambulatory Visit: Payer: Medicare HMO

## 2019-04-28 ENCOUNTER — Encounter: Payer: Self-pay | Admitting: Family Medicine

## 2019-04-28 VITALS — BP 124/91 | HR 86 | Temp 97.0°F

## 2019-04-28 DIAGNOSIS — R059 Cough, unspecified: Secondary | ICD-10-CM

## 2019-04-28 DIAGNOSIS — J441 Chronic obstructive pulmonary disease with (acute) exacerbation: Secondary | ICD-10-CM

## 2019-04-28 DIAGNOSIS — R05 Cough: Secondary | ICD-10-CM

## 2019-04-28 MED ORDER — PROMETHAZINE-CODEINE 6.25-10 MG/5ML PO SOLN: 5.0000 mL | Freq: Two times a day (BID) | ORAL | 0 refills | Status: DC

## 2019-04-28 NOTE — Progress Notes (Signed)
Name: Alexandra Foster   MRN: [id number]    DOB: November 17, 1950   Date:04/28/2019       Progress Note  Subjective  Chief Complaint  Chief Complaint  Patient presents with  . Cough    Onset- 5 days, clear cough  . Fatigue    I connected with  Alexandra Foster on 04/28/19 at 11:40 AM EST by telephone and verified that I am speaking with the correct person using two identifiers.  I discussed the limitations, risks, security and privacy concerns of performing an evaluation and management service by telephone and the availability of in person appointments. Staff also discussed with the patient that there may be a patient responsible charge related to this service. Patient Location: at home  Provider Location: [facility]   HPI  Cough: she states she developed a productive cough with clear phlegm about one week ago associated with fatigue and  Also  lack of appetite . She has COPD and denies wheezing or SOB. No fever or chills. Lack of taste but has a chronic lack of smell due to sinus surgery l. She had some tessalon perles at home and it seemed to help with symptoms. She states she is tired of coughing and would like a cough syrup instead. She has been using her inhalers.   Patient Active Problem List   Diagnosis Date Noted  . Bilateral lower extremity edema 05/03/2018  . Lower extremity pain, bilateral 05/03/2018  . Diabetes mellitus type 2 in obese ([place]) 04/23/2018  . Osteoarthritis of both knees 01/14/2018  . Urge incontinence 03/14/2016  . Osteopenia 02/13/2016  . OSA (obstructive sleep apnea) 01/08/2016  . Allergic rhinitis 04/13/2015  . Arterial vascular disease 09/18/2014  . Carotid artery plaque 09/18/2014  . COPD (chronic obstructive pulmonary disease) ([place]) 09/18/2014  . Morbid obesity ([place]) 09/18/2014  . Esophagitis, reflux 09/18/2014  . Essential hypertension 09/21/2006    Past Surgical History:  Procedure Laterality Date  . ABDOMINAL  HYSTERECTOMY    . COLONOSCOPY  10/29/2006   Dr Allen Norris  . COLONOSCOPY WITH PROPOFOL N/A 11/05/2016   Procedure: COLONOSCOPY WITH PROPOFOL;  Surgeon: Robert Bellow, MD;  Location: [facility] ENDOSCOPY;  Service: Endoscopy;  Laterality: N/A;  . NASAL SINUS SURGERY  2002   Dr Carlis Abbott    Family History  Problem Relation Age of Onset  . Congestive Heart Failure Mother   . Coronary artery disease Father   . Breast cancer Sister 72  . Heart disease Brother   . Varicose Veins Brother   . Alcohol abuse Brother       Current Outpatient Medications:  .  albuterol (PROVENTIL HFA;VENTOLIN HFA) 108 (90 Base) MCG/ACT inhaler, Inhale 1-2 puffs into the lungs every 4 (four) hours as needed for wheezing or shortness of breath., Disp: 1 Inhaler, Rfl: 2 .  albuterol (PROVENTIL) (2.5 MG/3ML) 0.083% nebulizer solution, Take 3 mLs (2.5 mg total) by nebulization every 6 (six) hours as needed for wheezing or shortness of breath., Disp: 150 mL, Rfl: 1 .  aspirin 81 MG tablet, Take 1 tablet (81 mg total) by mouth daily., Disp: 30 tablet, Rfl: 0 .  atorvastatin (LIPITOR) 40 MG tablet, TAKE 1 TABLET EVERY DAY, Disp: 90 tablet, Rfl: 1 .  Blood Glucose Calibration (ACCU-CHEK AVIVA) SOLN, 1 each by In Vitro route daily., Disp: 1 each, Rfl: 0 .  Blood Glucose Monitoring Suppl (ACCU-CHEK AVIVA PLUS) w/Device KIT, USE AS DIRECTED, Disp: 1 kit, Rfl: 0 .  calcium carbonate (OSCAL)  1500 (600 Ca) MG TABS tablet, Take 600 mg of elemental calcium by mouth 2 (two) times daily with a meal., Disp: , Rfl:  .  docusate sodium (COLACE) 100 MG capsule, Take 100 mg by mouth daily., Disp: , Rfl:  .  glucose blood (ACCU-CHEK AVIVA PLUS) test strip, Use as instructed, Disp: 100 each, Rfl: 12 .  Lancets (ACCU-CHEK SOFT TOUCH) lancets, Use as instructed, Disp: 100 each, Rfl: 12 .  metFORMIN (GLUCOPHAGE-XR) 750 MG 24 hr tablet, TAKE 1 TABLET DAILY WITH BREAKFAST., Disp: 90 tablet, Rfl: 1 .  montelukast (SINGULAIR) 10 MG tablet, Take 10 mg by  mouth at bedtime., Disp: , Rfl:  .  Multiple Vitamins-Minerals (MULTIVITAL PO), Take 1 tablet by mouth daily., Disp: , Rfl:  .  omeprazole (PRILOSEC) 20 MG capsule, TAKE 1 CAPSULE EVERY DAY, Disp: 90 capsule, Rfl: 1 .  triamterene-hydrochlorothiazide (DYAZIDE) 37.5-25 MG capsule, TAKE 1 CAPSULE EVERY DAY, Disp: 90 capsule, Rfl: 1 .  umeclidinium-vilanterol (ANORO ELLIPTA) 62.5-25 MCG/INH AEPB, Inhale 1 puff into the lungs daily., Disp: 180 each, Rfl: 1 .  vitamin B-12 (CYANOCOBALAMIN) 100 MCG tablet, Take 100 mcg by mouth daily., Disp: , Rfl:   No Known Allergies  I personally reviewed active problem list, medication list, allergies, family history, social history with the patient/caregiver today.   ROS  Ten systems reviewed and is negative except as mentioned in HPI   Objective  Virtual encounter, vitals  Obtained at home  Vitals:   04/28/19 1218  BP: (!) 124/91  Pulse: 86  Temp: (!) 97 F (36.1 C)    There is no height or weight on file to calculate BMI.  Physical Exam  Awake, alert and oriented  PHQ2/9: Depression screen [facility] 2/9 04/28/2019 12/15/2018 06/29/2018 04/23/2018 04/22/2018  Decreased Interest 0 0 0 0 0  Down, Depressed, Hopeless 0 0 0 0 0  PHQ - 2 Score 0 0 0 0 0  Altered sleeping 0 0 0 0 -  Tired, decreased energy 0 0 0 1 -  Change in appetite 0 0 0 0 -  Feeling bad or failure about yourself  0 0 0 0 -  Trouble concentrating 0 0 0 0 -  Moving slowly or fidgety/restless 0 0 0 0 -  Suicidal thoughts 0 0 0 0 -  PHQ-9 Score 0 0 0 1 -  Difficult doing work/chores Not difficult at all Not difficult at all - Not difficult at all -   PHQ-2/9 Result is negative.    Fall Risk: Fall Risk  04/28/2019 12/15/2018 06/29/2018 04/23/2018 04/22/2018  Falls in the past year? 0 0 0 0 0  Number falls in past yr: 0 0 0 0 0  Injury with Fall? 0 0 0 0 -  Follow up - - - Falls evaluation completed -    Assessment & Plan  1. Cough  She is getting tested for COVID-19 at local  pharmacy, appointment already made for 2 pm, discussed self quarantine until results are back, avoid taking cough syrup if she noticed sob or wheezing, if no improvement and COVID test negative we can check CXR and start prednisone and maybe antibiotics - Promethazine-Codeine 6.25-10 MG/5ML SOLN; Take 5 mLs by mouth 2 (two) times daily.  Dispense: 180 mL; Refill: 0  2. COPD exacerbation ([place])  See above  I discussed the assessment and treatment plan with the patient. The patient was provided an opportunity to ask questions and all were answered. The patient agreed with the plan and  demonstrated an understanding of the instructions.   The patient was advised to call back or seek an in-person evaluation if the symptoms worsen or if the condition fails to improve as anticipated.  I provided 15 minutes of non-face-to-face time during this encounter.  Loistine Chance, MD

## 2019-04-29 ENCOUNTER — Other Ambulatory Visit: Payer: Self-pay

## 2019-04-29 ENCOUNTER — Emergency Department: Payer: Medicare HMO

## 2019-04-29 ENCOUNTER — Inpatient Hospital Stay
Admission: EM | Admit: 2019-04-29 | Discharge: 2019-04-30 | DRG: 177 | Disposition: A | Discharge status: Other Acute Inpatient Hospital | Payer: Medicare HMO | Attending: Family Medicine | Admitting: Family Medicine

## 2019-04-29 ENCOUNTER — Encounter: Payer: Self-pay | Admitting: Intensive Care

## 2019-04-29 DIAGNOSIS — E119 Type 2 diabetes mellitus without complications: Secondary | ICD-10-CM | POA: Diagnosis not present

## 2019-04-29 DIAGNOSIS — Z87891 Personal history of nicotine dependence: Secondary | ICD-10-CM | POA: Diagnosis not present

## 2019-04-29 DIAGNOSIS — R05 Cough: Secondary | ICD-10-CM | POA: Diagnosis not present

## 2019-04-29 DIAGNOSIS — J188 Other pneumonia, unspecified organism: Secondary | ICD-10-CM | POA: Diagnosis not present

## 2019-04-29 DIAGNOSIS — E669 Obesity, unspecified: Secondary | ICD-10-CM | POA: Diagnosis present

## 2019-04-29 DIAGNOSIS — Z90710 Acquired absence of both cervix and uterus: Secondary | ICD-10-CM

## 2019-04-29 DIAGNOSIS — I10 Essential (primary) hypertension: Secondary | ICD-10-CM | POA: Diagnosis not present

## 2019-04-29 DIAGNOSIS — U071 COVID-19: Secondary | ICD-10-CM | POA: Diagnosis not present

## 2019-04-29 DIAGNOSIS — Z7984 Long term (current) use of oral hypoglycemic drugs: Secondary | ICD-10-CM | POA: Diagnosis not present

## 2019-04-29 DIAGNOSIS — Z6839 Body mass index (BMI) 39.0-39.9, adult: Secondary | ICD-10-CM | POA: Diagnosis not present

## 2019-04-29 DIAGNOSIS — J1282 Pneumonia due to coronavirus disease 2019: Secondary | ICD-10-CM | POA: Diagnosis not present

## 2019-04-29 DIAGNOSIS — K219 Gastro-esophageal reflux disease without esophagitis: Secondary | ICD-10-CM | POA: Diagnosis present

## 2019-04-29 DIAGNOSIS — R0902 Hypoxemia: Secondary | ICD-10-CM | POA: Diagnosis not present

## 2019-04-29 DIAGNOSIS — Z7982 Long term (current) use of aspirin: Secondary | ICD-10-CM | POA: Diagnosis not present

## 2019-04-29 DIAGNOSIS — J440 Chronic obstructive pulmonary disease with acute lower respiratory infection: Secondary | ICD-10-CM | POA: Diagnosis present

## 2019-04-29 DIAGNOSIS — Z79899 Other long term (current) drug therapy: Secondary | ICD-10-CM

## 2019-04-29 DIAGNOSIS — J449 Chronic obstructive pulmonary disease, unspecified: Secondary | ICD-10-CM | POA: Diagnosis not present

## 2019-04-29 DIAGNOSIS — J9601 Acute respiratory failure with hypoxia: Secondary | ICD-10-CM | POA: Diagnosis not present

## 2019-04-29 DIAGNOSIS — G4733 Obstructive sleep apnea (adult) (pediatric): Secondary | ICD-10-CM | POA: Diagnosis present

## 2019-04-29 DIAGNOSIS — R0602 Shortness of breath: Secondary | ICD-10-CM | POA: Diagnosis not present

## 2019-04-29 DIAGNOSIS — B349 Viral infection, unspecified: Secondary | ICD-10-CM | POA: Diagnosis not present

## 2019-04-29 LAB — CBC WITH DIFFERENTIAL/PLATELET
Abs Immature Granulocytes: 0.14 10*3/uL — ABNORMAL HIGH (ref 0.00–0.07)
Basophils Absolute: 0 10*3/uL (ref 0.0–0.1)
Basophils Relative: 0 %
Eosinophils Absolute: 0.6 10*3/uL — ABNORMAL HIGH (ref 0.0–0.5)
Eosinophils Relative: 6 %
HCT: 38.3 % (ref 36.0–46.0)
Hemoglobin: 11.7 g/dL — ABNORMAL LOW (ref 12.0–15.0)
Immature Granulocytes: 2 %
Lymphocytes Relative: 7 %
Lymphs Abs: 0.7 10*3/uL (ref 0.7–4.0)
MCH: 24.3 pg — ABNORMAL LOW (ref 26.0–34.0)
MCHC: 30.5 g/dL (ref 30.0–36.0)
MCV: 79.5 fL — ABNORMAL LOW (ref 80.0–100.0)
Monocytes Absolute: 0.2 10*3/uL (ref 0.1–1.0)
Monocytes Relative: 2 %
Neutro Abs: 7.8 10*3/uL — ABNORMAL HIGH (ref 1.7–7.7)
Neutrophils Relative %: 83 %
Platelets: 440 10*3/uL — ABNORMAL HIGH (ref 150–400)
RBC: 4.82 MIL/uL (ref 3.87–5.11)
RDW: 13.8 % (ref 11.5–15.5)
WBC: 9.4 10*3/uL (ref 4.0–10.5)
nRBC: 0.3 % — ABNORMAL HIGH (ref 0.0–0.2)

## 2019-04-29 LAB — LACTIC ACID, PLASMA
Lactic Acid, Venous: 1.5 mmol/L (ref 0.5–1.9)
Lactic Acid, Venous: 1.1 mmol/L (ref 0.5–1.9)

## 2019-04-29 LAB — FERRITIN: Ferritin: 136 ng/mL (ref 11–307)

## 2019-04-29 LAB — COMPREHENSIVE METABOLIC PANEL
ALT: 21 U/L (ref 0–44)
AST: 27 U/L (ref 15–41)
Albumin: 2.8 g/dL — ABNORMAL LOW (ref 3.5–5.0)
Alkaline Phosphatase: 63 U/L (ref 38–126)
Anion gap: 13 (ref 5–15)
BUN: 16 mg/dL (ref 8–23)
CO2: 27 mmol/L (ref 22–32)
Calcium: 8.8 mg/dL — ABNORMAL LOW (ref 8.9–10.3)
Chloride: 101 mmol/L (ref 98–111)
Creatinine, Ser: 0.9 mg/dL (ref 0.44–1.00)
GFR calc Af Amer: >60 mL/min (ref >60)
GFR calc non Af Amer: >60 mL/min (ref >60)
Glucose, Bld: 100 mg/dL — ABNORMAL HIGH (ref 70–99)
Potassium: 3.1 mmol/L — ABNORMAL LOW (ref 3.5–5.1)
Sodium: 141 mmol/L (ref 135–145)
Total Bilirubin: 0.8 mg/dL (ref 0.3–1.2)
Total Protein: 7.9 g/dL (ref 6.5–8.1)

## 2019-04-29 LAB — GLUCOSE, CAPILLARY: Glucose-Capillary: 137 mg/dL — ABNORMAL HIGH (ref 70–99)

## 2019-04-29 LAB — BRAIN NATRIURETIC PEPTIDE: B Natriuretic Peptide: 25 pg/mL (ref 0.0–100.0)

## 2019-04-29 LAB — TRIGLYCERIDES: Triglycerides: 118 mg/dL (ref <150)

## 2019-04-29 LAB — FIBRINOGEN: Fibrinogen: 643 mg/dL — ABNORMAL HIGH (ref 210–475)

## 2019-04-29 LAB — C-REACTIVE PROTEIN: CRP: 8.4 mg/dL — ABNORMAL HIGH (ref <1.0)

## 2019-04-29 LAB — POC SARS CORONAVIRUS 2 AG: SARS Coronavirus 2 Ag: POSITIVE — AB

## 2019-04-29 LAB — LACTATE DEHYDROGENASE: LDH: 308 U/L — ABNORMAL HIGH (ref 98–192)

## 2019-04-29 LAB — TROPONIN I (HIGH SENSITIVITY)
Troponin I (High Sensitivity): <2 ng/L (ref <18)
Troponin I (High Sensitivity): <2 ng/L (ref <18)

## 2019-04-29 LAB — FIBRIN DERIVATIVES D-DIMER (ARMC ONLY): Fibrin derivatives D-dimer (ARMC): 3970.48 ng/mL (FEU) — ABNORMAL HIGH (ref 0.00–499.00)

## 2019-04-29 LAB — PROCALCITONIN: Procalcitonin: <0.1 ng/mL

## 2019-04-29 LAB — ABO/RH: ABO/RH(D): O POS

## 2019-04-29 MED ORDER — ZINC SULFATE 220 (50 ZN) MG PO CAPS
220.0000 mg | ORAL_CAPSULE | Freq: Every day | ORAL | Status: DC
  Administered 2019-04-29: 22:00:00 220 mg via ORAL
  Filled 2019-04-29: qty 1

## 2019-04-29 MED ORDER — INSULIN ASPART 100 UNIT/ML ~~LOC~~ SOLN
0.0000 [IU] | SUBCUTANEOUS | Status: DC
  Administered 2019-04-29 – 2019-04-30 (×2): 2 [IU] via SUBCUTANEOUS
  Administered 2019-04-30: 3 [IU] via SUBCUTANEOUS
  Filled 2019-04-29 (×3): qty 1

## 2019-04-29 MED ORDER — DEXAMETHASONE SODIUM PHOSPHATE 10 MG/ML IJ SOLN
6.0000 mg | Freq: Once | INTRAMUSCULAR | Status: AC
  Administered 2019-04-29: 6 mg via INTRAVENOUS
  Filled 2019-04-29: qty 1

## 2019-04-29 MED ORDER — ASCORBIC ACID 500 MG PO TABS
500.0000 mg | ORAL_TABLET | Freq: Every day | ORAL | Status: DC
  Administered 2019-04-29: 22:00:00 500 mg via ORAL
  Filled 2019-04-29: qty 1

## 2019-04-29 MED ORDER — GUAIFENESIN-DM 100-10 MG/5ML PO SYRP
10.0000 mL | ORAL_SOLUTION | ORAL | Status: DC | PRN
  Filled 2019-04-29: qty 10

## 2019-04-29 MED ORDER — ONDANSETRON HCL 4 MG PO TABS: 4.0000 mg | ORAL_TABLET | Freq: Four times a day (QID) | ORAL | Status: DC | PRN

## 2019-04-29 MED ORDER — VITAMIN D 25 MCG (1000 UNIT) PO TABS
1000.0000 [IU] | ORAL_TABLET | Freq: Every day | ORAL | Status: DC
  Administered 2019-04-29 – 2019-04-30 (×2): 1000 [IU] via ORAL
  Filled 2019-04-29 (×2): qty 1

## 2019-04-29 MED ORDER — SODIUM CHLORIDE 0.9 % IV SOLN
200.0000 mg | Freq: Once | INTRAVENOUS | Status: AC
  Administered 2019-04-29: 23:00:00 200 mg via INTRAVENOUS
  Filled 2019-04-29: qty 200

## 2019-04-29 MED ORDER — ASPIRIN EC 81 MG PO TBEC
81.0000 mg | DELAYED_RELEASE_TABLET | Freq: Every day | ORAL | Status: DC
  Administered 2019-04-30: 81 mg via ORAL
  Filled 2019-04-29: qty 1

## 2019-04-29 MED ORDER — ASPIRIN EC 81 MG PO TBEC: 81.0000 mg | DELAYED_RELEASE_TABLET | Freq: Every day | ORAL | Status: DC

## 2019-04-29 MED ORDER — ENOXAPARIN SODIUM 40 MG/0.4ML ~~LOC~~ SOLN
40.0000 mg | SUBCUTANEOUS | Status: DC
  Administered 2019-04-29: 22:00:00 40 mg via SUBCUTANEOUS
  Filled 2019-04-29: qty 0.4

## 2019-04-29 MED ORDER — TRIAMTERENE-HCTZ 37.5-25 MG PO CAPS: 1.0000 | ORAL_CAPSULE | Freq: Every day | ORAL | Status: DC

## 2019-04-29 MED ORDER — ACETAMINOPHEN 325 MG PO TABS: 650.0000 mg | ORAL_TABLET | Freq: Four times a day (QID) | ORAL | Status: DC | PRN

## 2019-04-29 MED ORDER — TRAZODONE HCL 50 MG PO TABS: 25.0000 mg | ORAL_TABLET | Freq: Every evening | ORAL | Status: DC | PRN

## 2019-04-29 MED ORDER — VITAMIN B-12 100 MCG PO TABS
100.0000 ug | ORAL_TABLET | Freq: Every day | ORAL | Status: DC
  Administered 2019-04-30: 11:00:00 100 ug via ORAL
  Filled 2019-04-29: qty 1

## 2019-04-29 MED ORDER — PANTOPRAZOLE SODIUM 40 MG PO TBEC
40.0000 mg | DELAYED_RELEASE_TABLET | Freq: Every day | ORAL | Status: DC
  Administered 2019-04-30: 11:00:00 40 mg via ORAL
  Filled 2019-04-29: qty 1

## 2019-04-29 MED ORDER — DOCUSATE SODIUM 100 MG PO CAPS
100.0000 mg | ORAL_CAPSULE | Freq: Every day | ORAL | Status: DC
  Administered 2019-04-30: 11:00:00 100 mg via ORAL
  Filled 2019-04-29: qty 1

## 2019-04-29 MED ORDER — HYDROCOD POLST-CPM POLST ER 10-8 MG/5ML PO SUER: 5.0000 mL | Freq: Two times a day (BID) | ORAL | Status: DC | PRN

## 2019-04-29 MED ORDER — DEXAMETHASONE SODIUM PHOSPHATE 10 MG/ML IJ SOLN: 6.0000 mg | INTRAMUSCULAR | Status: DC

## 2019-04-29 MED ORDER — UMECLIDINIUM-VILANTEROL 62.5-25 MCG/INH IN AEPB
1.0000 | INHALATION_SPRAY | Freq: Every day | RESPIRATORY_TRACT | Status: DC
  Administered 2019-04-30: 1 via RESPIRATORY_TRACT
  Filled 2019-04-29: qty 14

## 2019-04-29 MED ORDER — ONDANSETRON HCL 4 MG/2ML IJ SOLN: 4.0000 mg | Freq: Four times a day (QID) | INTRAMUSCULAR | Status: DC | PRN

## 2019-04-29 MED ORDER — MONTELUKAST SODIUM 10 MG PO TABS
10.0000 mg | ORAL_TABLET | Freq: Every day | ORAL | Status: DC
  Administered 2019-04-29: 10 mg via ORAL
  Filled 2019-04-29 (×2): qty 1

## 2019-04-29 MED ORDER — ATORVASTATIN CALCIUM 20 MG PO TABS
40.0000 mg | ORAL_TABLET | Freq: Every day | ORAL | Status: DC
  Administered 2019-04-29: 40 mg via ORAL
  Filled 2019-04-29: qty 2

## 2019-04-29 MED ORDER — CALCIUM CARBONATE 1500 (600 CA) MG PO TABS: 600.0000 mg | ORAL_TABLET | Freq: Two times a day (BID) | ORAL | Status: DC

## 2019-04-29 MED ORDER — TRIAMTERENE-HCTZ 37.5-25 MG PO TABS
1.0000 | ORAL_TABLET | Freq: Every day | ORAL | Status: DC
  Administered 2019-04-30: 1 via ORAL
  Filled 2019-04-29: qty 1

## 2019-04-29 MED ORDER — GUAIFENESIN ER 600 MG PO TB12
600.0000 mg | ORAL_TABLET | Freq: Two times a day (BID) | ORAL | Status: DC
  Administered 2019-04-29 – 2019-04-30 (×2): 600 mg via ORAL
  Filled 2019-04-29 (×2): qty 1

## 2019-04-29 MED ORDER — SODIUM CHLORIDE 0.9 % IV SOLN: INTRAVENOUS | Status: DC

## 2019-04-29 MED ORDER — MAGNESIUM HYDROXIDE 400 MG/5ML PO SUSP: 30.0000 mL | Freq: Every day | ORAL | Status: DC | PRN

## 2019-04-29 MED ORDER — ALBUTEROL SULFATE HFA 108 (90 BASE) MCG/ACT IN AERS
1.0000 | INHALATION_SPRAY | RESPIRATORY_TRACT | Status: DC | PRN
  Filled 2019-04-29: qty 6.7

## 2019-04-29 MED ORDER — FAMOTIDINE 20 MG PO TABS
20.0000 mg | ORAL_TABLET | Freq: Two times a day (BID) | ORAL | Status: DC
  Administered 2019-04-29 – 2019-04-30 (×2): 20 mg via ORAL
  Filled 2019-04-29 (×2): qty 1

## 2019-04-29 MED ORDER — SODIUM CHLORIDE 0.9 % IV SOLN
100.0000 mg | Freq: Every day | INTRAVENOUS | Status: DC
  Administered 2019-04-30: 11:00:00 100 mg via INTRAVENOUS
  Filled 2019-04-29 (×2): qty 20

## 2019-04-29 NOTE — Consult Note (Signed)
Remdesivir - Pharmacy Brief Note   O:  ALT: 21 CXR: Impression of multifocal pneumonia SpO2: Hypoxic requiring 2L [place]   A/P:  04/29/19 SARS-CoV-2 POC antigen test positive  Remdesivir 200 mg IVPB once followed by 100 mg IVPB daily x 4 days. (already ordered)  [place] Resident 04/29/2019 9:05 PM

## 2019-04-29 NOTE — ED Triage Notes (Signed)
Patient sent from [facility] urgent care for cough and hypoxemia. Patient reports they also did a chest xray.

## 2019-04-29 NOTE — H&P (Signed)
[facility] [place] NAME: Alexandra Foster    MR#:  [id number]  DATE OF BIRTH:  Nov 28, 1950  DATE OF ADMISSION:  04/29/2019  PRIMARY CARE PHYSICIAN: Steele Sizer, MD   REQUESTING/REFERRING PHYSICIAN: Marjean Donna, MD  CHIEF COMPLAINT:   Chief Complaint  Patient presents with  . Cough    HISTORY OF PRESENT ILLNESS:  Alexandra Foster  is a 69 y.o. female with a known history of COPD/asthma, type 2 diabetes mellitus, hypertension, and obstructive sleep apnea on CPAP, who presented to the emergency room with acute onset of worsening dyspnea with associated cough with no wheezing since last week.  She denied any fever or chills.  Her cough is been any dry.  She admits to loss of taste and smell.  No nausea or vomiting or diarrhea.  She denies any chest pain or palpitations or abdominal pain bleeding diathesis.  No known COVID-19 exposures.  Upon presentation to the emergency room, that she was 99.8 with a pulse ox 97% on room air.  That went up to 94% on 2 L of O2 by nasal cannula.  Labs revealed hypokalemia with potassium of 3.1 and CMP otherwise was unremarkable.  BNP was 25 and troponin I less than 2.  Procalcitonin was less than 0.1 and CBC showed mild thrombocytosis of 440 and hemoglobin of 11.7 with hematocrit 38.3.  COVID-19 rapid antigen test came back positive.  Chest ray showed multifocal pneumonia more notable on the right mid and lower lung zones than elsewhere.  The patient was given 6 mg of IV Decadron and was ordered IV remdesivir.  She will be admitted to medical monitored isolation bed for further evaluation and management. PAST MEDICAL HISTORY:   Past Medical History:  Diagnosis Date  . Arthritis   . Asthma   . COPD (chronic obstructive pulmonary disease) ([place])   . Diabetes mellitus without complication ([place])   . Endometriosis   . GERD (gastroesophageal reflux disease)   . Hypertension   . Obesity   . Sleep apnea    CPAP    PAST SURGICAL  HISTORY:   Past Surgical History:  Procedure Laterality Date  . ABDOMINAL HYSTERECTOMY    . COLONOSCOPY  10/29/2006   Dr Allen Norris  . COLONOSCOPY WITH PROPOFOL N/A 11/05/2016   Procedure: COLONOSCOPY WITH PROPOFOL;  Surgeon: Robert Bellow, MD;  Location: [facility] ENDOSCOPY;  Service: Endoscopy;  Laterality: N/A;  . NASAL SINUS SURGERY  2002   Dr Carlis Abbott    SOCIAL HISTORY:   Social History   Tobacco Use  . Smoking status: Former Smoker    Packs/day: 2.00    Years: 40.00    Pack years: 80.00    Types: Cigarettes    Quit date: 2008    Years since quitting: 13.0  . Smokeless tobacco: Former Systems developer    Types: Snuff    Quit date: 04/2001  . Tobacco comment: smoking cessation materials not required  Substance Use Topics  . Alcohol use: No    Alcohol/week: 0.0 standard drinks    FAMILY HISTORY:   Family History  Problem Relation Age of Onset  . Congestive Heart Failure Mother   . Coronary artery disease Father   . Breast cancer Sister 51  . Heart disease Brother   . Varicose Veins Brother   . Alcohol abuse Brother     DRUG ALLERGIES:  No Known Allergies  REVIEW OF SYSTEMS:   ROS As per history of present illness. All pertinent systems  were reviewed above. Constitutional,  HEENT, cardiovascular, respiratory, GI, GU, musculoskeletal, neuro, psychiatric, endocrine,  integumentary and hematologic systems were reviewed and are otherwise  negative/unremarkable except for positive findings mentioned above in the HPI.   MEDICATIONS AT HOME:   Prior to Admission medications   Medication Sig Start Date End Date Taking? Authorizing Provider  albuterol (PROVENTIL HFA;VENTOLIN HFA) 108 (90 Base) MCG/ACT inhaler Inhale 1-2 puffs into the lungs every 4 (four) hours as needed for wheezing or shortness of breath. 04/23/18   Hubbard Hartshorn, FNP  albuterol (PROVENTIL) (2.5 MG/3ML) 0.083% nebulizer solution Take 3 mLs (2.5 mg total) by nebulization every 6 (six) hours as needed for wheezing  or shortness of breath. 04/23/18   Hubbard Hartshorn, FNP  aspirin 81 MG tablet Take 1 tablet (81 mg total) by mouth daily. 01/08/16   Steele Sizer, MD  atorvastatin (LIPITOR) 40 MG tablet TAKE 1 TABLET EVERY DAY 03/22/19   Steele Sizer, MD  Blood Glucose Calibration (ACCU-CHEK AVIVA) SOLN 1 each by In Vitro route daily. 12/08/17   Steele Sizer, MD  Blood Glucose Monitoring Suppl (ACCU-CHEK AVIVA PLUS) w/Device KIT USE AS DIRECTED 01/13/19   Ancil Boozer, Drue Stager, MD  calcium carbonate (OSCAL) 1500 (600 Ca) MG TABS tablet Take 600 mg of elemental calcium by mouth 2 (two) times daily with a meal.    [provider]  docusate sodium (COLACE) 100 MG capsule Take 100 mg by mouth daily.    [provider]  glucose blood (ACCU-CHEK AVIVA PLUS) test strip Use as instructed 12/08/17   Steele Sizer, MD  Lancets (ACCU-CHEK SOFT TOUCH) lancets Use as instructed 12/08/17   Steele Sizer, MD  metFORMIN (GLUCOPHAGE-XR) 750 MG 24 hr tablet TAKE 1 TABLET DAILY WITH BREAKFAST. 12/16/18   Sowles, Drue Stager, MD  montelukast (SINGULAIR) 10 MG tablet Take 10 mg by mouth at bedtime.    [provider]  Multiple Vitamins-Minerals (MULTIVITAL PO) Take 1 tablet by mouth daily.    [provider]  omeprazole (PRILOSEC) 20 MG capsule TAKE 1 CAPSULE EVERY DAY 12/16/18   Steele Sizer, MD  Promethazine-Codeine 6.25-10 MG/5ML SOLN Take 5 mLs by mouth 2 (two) times daily. 04/28/19   Steele Sizer, MD  triamterene-hydrochlorothiazide (DYAZIDE) 37.5-25 MG capsule TAKE 1 CAPSULE EVERY DAY 03/03/19   Sowles, Drue Stager, MD  umeclidinium-vilanterol [facility] ELLIPTA) 62.5-25 MCG/INH AEPB Inhale 1 puff into the lungs daily. 04/23/18   Hubbard Hartshorn, FNP  vitamin B-12 (CYANOCOBALAMIN) 100 MCG tablet Take 100 mcg by mouth daily.    [provider]      VITAL SIGNS:  Blood pressure (!) 117/59, pulse 77, temperature 99.8 F (37.7 C), temperature source Oral, resp. rate 20, height '5\' 5"'  (1.651 m),  weight 106.6 kg, SpO2 (!) 89 %.  PHYSICAL EXAMINATION:  Physical Exam  GENERAL:  69 y.o.-year-old African-American female patient lying in the bed in mild respiratory distress with conversational dyspnea. EYES: Pupils equal, round, reactive to light and accommodation. No scleral icterus. Extraocular muscles intact.  HEENT: Head atraumatic, normocephalic. Oropharynx and nasopharynx clear.  NECK:  Supple, no jugular venous distention. No thyroid enlargement, no tenderness.  LUNGS: Diminished bibasal breath sounds with bibasal and midlung zone crackles. CARDIOVASCULAR: Regular rate and rhythm, S1, S2 normal. No murmurs, rubs, or gallops.  ABDOMEN: Soft, nondistended, nontender. Bowel sounds present. No organomegaly or mass.  EXTREMITIES: No pedal edema, cyanosis, or clubbing.  NEUROLOGIC: Cranial nerves II through XII are intact. Muscle strength 5/5 in all extremities. Sensation intact. Gait not  checked.  PSYCHIATRIC: The patient is alert and oriented x 3.  Normal affect and good eye contact. SKIN: No obvious rash, lesion, or ulcer.   LABORATORY PANEL:   CBC Recent Labs  Lab 04/29/19 1351  WBC 9.4  HGB 11.7*  HCT 38.3  PLT 440*   ------------------------------------------------------------------------------------------------------------------  Chemistries  Recent Labs  Lab 04/29/19 1351  NA 141  K 3.1*  CL 101  CO2 27  GLUCOSE 100*  BUN 16  CREATININE 0.90  CALCIUM 8.8*  AST 27  ALT 21  ALKPHOS 63  BILITOT 0.8   ------------------------------------------------------------------------------------------------------------------  Cardiac Enzymes No results for input(s): TROPONINI in the last 168 hours. ------------------------------------------------------------------------------------------------------------------  RADIOLOGY:  DG Chest 2 View  Result Date: 04/29/2019 CLINICAL DATA:  Cough and hypoxia EXAM: CHEST - 2 VIEW COMPARISON:  February 07, 2014 FINDINGS: There  is multifocal airspace opacity with somewhat greater opacity in the right mid and lower lung zones compared to the left side where opacity is mainly in the left lower lung region. Heart size and pulmonary vascularity are normal. No adenopathy. There is mild degenerative change in the thoracic spine. IMPRESSION: Multifocal pneumonia, more notable in the right mid and lower lung zones than elsewhere. Cardiac silhouette within normal limits. No adenopathy. Electronically Signed   By: Lowella Grip III M.D.   On: 04/29/2019 14:35      IMPRESSION AND PLAN:   1.  Acute hypoxemic respiratory failure secondary to COVID-19. -The patient will be admitted to a medically monitored isolation bed. -O2 protocol will be followed to keep O2 saturation above 93.   2.  Multifocal pneumonia secondary to COVID-19. -The patient will be admitted to an isolation monitored bed with droplet and contact precautions. -Given multifocal pneumonia we will empirically place the patient on IV Rocephin and Zithromax for possible bacterial superinfection only with elevated Procalcitonin. -The patient will be placed on scheduled Mucinex and as needed Tussionex. -We will avoid nebulization as much as we can, give bronchodilator MDI if needed, and with deterioration of oxygenation try to avoid BiPAP/CPAP if possible.    -Will obtain sputum Gram stain culture and sensitivity and follow blood cultures. -O2 protocol will be followed. -We will follow CRP, ferritin, LDH and D-dimer. -Will follow manual differential for ANC/ALC ratio as well as follow troponin I and daily CBC with manual differential and CMP. - Will place the patient on IV Remdisivir and IV steroid therapy with Decadron with elevated inflammatory markers. -The patient will be placed on vitamin D3, vitamin C, zinc sulfate, p.o. Pepcid and aspirin. -Actemra can be considered for CRP more than 7 with associated hypoxemia.  3.  Type 2 diabetes mellitus  -The patient  will be placed on supplement coverage with NovoLog and will hold off Metformin.  4.  Hypertension.  We will continue her antihypertensives.  5.  COPD/asthma.  No current exacerbation. -We will place her on albuterol MDI as needed. -I will hold off her Breo Ellipta. -We will continue Singulair.  6.  GERD. -We will continue PPI therapy.  7.  DVT prophylaxis. -Subtenons Lovenox    All the records are reviewed and case discussed with ED provider. The plan of care was discussed in details with the patient (and family). I answered all questions. The patient agreed to proceed with the above mentioned plan. Further management will depend upon hospital course.   CODE STATUS: Full code  TOTAL TIME TAKING CARE OF THIS PATIENT: 55 minutes.    Christel Mormon M.D on  04/29/2019 at 8:49 PM  [facility]   From 7 PM-7 AM, contact night-coverage [email or web address]  CC: Primary care physician; Steele Sizer, MD   Note: This dictation was prepared with Dragon dictation along with smaller phrase technology. Any transcriptional errors that result from this process are unintentional.

## 2019-04-29 NOTE — ED Provider Notes (Signed)
[facility] Emergency Department Provider Note  ____________________________________________   None    (approximate)  I have reviewed the triage vital signs and the nursing notes.   HISTORY  Chief Complaint Cough    HPI Alexandra Foster is a 69 y.o. female with COPD, diabetes, hypertension, sleep apnea who comes in from urgent care for cough and hypoxemia.  Patient states that 2 of the people that she hangs out with test positive for coronavirus.  She went to urgent care today because she went to see if she had coronavirus.  She started feeling short of breath today as well.  Shortness of breath is moderate, worse with exertion, better at rest, constant.  Denies any abdominal pain, chest pain.  Maybe a little bit of darker urine than normal.  Patient was told that she was hypoxic at the urgent care but unclear exactly how low she was.  Patient was put on 2 L.          Past Medical History:  Diagnosis Date  . Arthritis   . Asthma   . COPD (chronic obstructive pulmonary disease) ([place])   . Diabetes mellitus without complication ([place])   . Endometriosis   . GERD (gastroesophageal reflux disease)   . Hypertension   . Obesity   . Sleep apnea    CPAP    Patient Active Problem List   Diagnosis Date Noted  . Bilateral lower extremity edema 05/03/2018  . Lower extremity pain, bilateral 05/03/2018  . Diabetes mellitus type 2 in obese ([place]) 04/23/2018  . Osteoarthritis of both knees 01/14/2018  . Urge incontinence 03/14/2016  . Osteopenia 02/13/2016  . OSA (obstructive sleep apnea) 01/08/2016  . Allergic rhinitis 04/13/2015  . Arterial vascular disease 09/18/2014  . Carotid artery plaque 09/18/2014  . COPD (chronic obstructive pulmonary disease) ([place]) 09/18/2014  . Morbid obesity ([place]) 09/18/2014  . Esophagitis, reflux 09/18/2014  . Essential hypertension 09/21/2006    Past Surgical History:  Procedure Laterality Date  . ABDOMINAL  HYSTERECTOMY    . COLONOSCOPY  10/29/2006   Dr Allen Norris  . COLONOSCOPY WITH PROPOFOL N/A 11/05/2016   Procedure: COLONOSCOPY WITH PROPOFOL;  Surgeon: Robert Bellow, MD;  Location: [facility] ENDOSCOPY;  Service: Endoscopy;  Laterality: N/A;  . NASAL SINUS SURGERY  2002   Dr Carlis Abbott    Prior to Admission medications   Medication Sig Start Date End Date Taking? Authorizing Provider  albuterol (PROVENTIL HFA;VENTOLIN HFA) 108 (90 Base) MCG/ACT inhaler Inhale 1-2 puffs into the lungs every 4 (four) hours as needed for wheezing or shortness of breath. 04/23/18   Hubbard Hartshorn, FNP  albuterol (PROVENTIL) (2.5 MG/3ML) 0.083% nebulizer solution Take 3 mLs (2.5 mg total) by nebulization every 6 (six) hours as needed for wheezing or shortness of breath. 04/23/18   Hubbard Hartshorn, FNP  aspirin 81 MG tablet Take 1 tablet (81 mg total) by mouth daily. 01/08/16   Steele Sizer, MD  atorvastatin (LIPITOR) 40 MG tablet TAKE 1 TABLET EVERY DAY 03/22/19   Steele Sizer, MD  Blood Glucose Calibration (ACCU-CHEK AVIVA) SOLN 1 each by In Vitro route daily. 12/08/17   Steele Sizer, MD  Blood Glucose Monitoring Suppl (ACCU-CHEK AVIVA PLUS) w/Device KIT USE AS DIRECTED 01/13/19   Ancil Boozer, Drue Stager, MD  calcium carbonate (OSCAL) 1500 (600 Ca) MG TABS tablet Take 600 mg of elemental calcium by mouth 2 (two) times daily with a meal.    [provider]  docusate sodium (COLACE) 100 MG capsule  Take 100 mg by mouth daily.    [provider]  glucose blood (ACCU-CHEK AVIVA PLUS) test strip Use as instructed 12/08/17   Steele Sizer, MD  Lancets (ACCU-CHEK SOFT TOUCH) lancets Use as instructed 12/08/17   Steele Sizer, MD  metFORMIN (GLUCOPHAGE-XR) 750 MG 24 hr tablet TAKE 1 TABLET DAILY WITH BREAKFAST. 12/16/18   Sowles, Drue Stager, MD  montelukast (SINGULAIR) 10 MG tablet Take 10 mg by mouth at bedtime.    [provider]  Multiple Vitamins-Minerals (MULTIVITAL PO) Take 1 tablet by mouth daily.    [provider]  omeprazole (PRILOSEC) 20 MG capsule TAKE 1 CAPSULE EVERY DAY 12/16/18   Steele Sizer, MD  Promethazine-Codeine 6.25-10 MG/5ML SOLN Take 5 mLs by mouth 2 (two) times daily. 04/28/19   Steele Sizer, MD  triamterene-hydrochlorothiazide (DYAZIDE) 37.5-25 MG capsule TAKE 1 CAPSULE EVERY DAY 03/03/19   Sowles, Drue Stager, MD  umeclidinium-vilanterol [facility] ELLIPTA) 62.5-25 MCG/INH AEPB Inhale 1 puff into the lungs daily. 04/23/18   Hubbard Hartshorn, FNP  vitamin B-12 (CYANOCOBALAMIN) 100 MCG tablet Take 100 mcg by mouth daily.    [provider]    Allergies Patient has no known allergies.  Family History  Problem Relation Age of Onset  . Congestive Heart Failure Mother   . Coronary artery disease Father   . Breast cancer Sister 34  . Heart disease Brother   . Varicose Veins Brother   . Alcohol abuse Brother     Social History Social History   Tobacco Use  . Smoking status: Former Smoker    Packs/day: 2.00    Years: 40.00    Pack years: 80.00    Types: Cigarettes    Quit date: 2008    Years since quitting: 13.0  . Smokeless tobacco: Former Systems developer    Types: Snuff    Quit date: 04/2001  . Tobacco comment: smoking cessation materials not required  Substance Use Topics  . Alcohol use: No    Alcohol/week: 0.0 standard drinks  . Drug use: No      Review of Systems Constitutional: No fever/chills Eyes: No visual changes. ENT: No sore throat. Cardiovascular: No chest pain Respiratory: Positive for SOB Gastrointestinal: No abdominal pain.  No nausea, no vomiting.  No diarrhea.  No constipation. Genitourinary: Negative for dysuria. Musculoskeletal: Negative for back pain. Skin: Negative for rash. Neurological: Negative for headaches, focal weakness or numbness. All other ROS negative ____________________________________________   PHYSICAL EXAM:  VITAL SIGNS: ED Triage Vitals  Enc Vitals Group     BP 04/29/19 1344 (!) 105/55     Pulse Rate 04/29/19  1344 78     Resp 04/29/19 1344 18     Temp 04/29/19 1344 99.8 F (37.7 C)     Temp Source 04/29/19 1344 Oral     SpO2 04/29/19 1344 (!) 87 %     Weight 04/29/19 1345 235 lb (106.6 kg)     Height 04/29/19 1345 '5\' 5"'  (1.651 m)     Head Circumference --      Peak Flow --      Pain Score 04/29/19 1345 0     Pain Loc --      Pain Edu? --      Excl. in [place]? --     Constitutional: Alert and oriented. Well appearing and in no acute distress. Eyes: Conjunctivae are normal. EOMI. Head: Atraumatic. Nose: No congestion/rhinnorhea. Mouth/Throat: Mucous membranes are moist.   Neck: No stridor. Trachea Midline. FROM Cardiovascular: Normal rate,  regular rhythm. Grossly normal heart sounds.  Good peripheral circulation. Respiratory: On 2 L, clear lungs Gastrointestinal: Soft and nontender. No distention. No abdominal bruits.  Musculoskeletal: No lower extremity tenderness nor edema.  No joint effusions. Neurologic:  Normal speech and language. No gross focal neurologic deficits are appreciated.  Skin:  Skin is warm, dry and intact. No rash noted. Psychiatric: Mood and affect are normal. Speech and behavior are normal. GU: Deferred   ____________________________________________   LABS (all labs ordered are listed, but only abnormal results are displayed)  Labs Reviewed  CBC WITH DIFFERENTIAL/PLATELET - Abnormal; Notable for the following components:      Result Value   Hemoglobin 11.7 (*)    MCV 79.5 (*)    MCH 24.3 (*)    Platelets 440 (*)    nRBC 0.3 (*)    Neutro Abs 7.8 (*)    Eosinophils Absolute 0.6 (*)    Abs Immature Granulocytes 0.14 (*)    All other components within normal limits  COMPREHENSIVE METABOLIC PANEL - Abnormal; Notable for the following components:   Potassium 3.1 (*)    Glucose, Bld 100 (*)    Calcium 8.8 (*)    Albumin 2.8 (*)    All other components within normal limits  POC SARS CORONAVIRUS 2 AG - Abnormal; Notable for the following components:   SARS  Coronavirus 2 Ag POSITIVE (*)    All other components within normal limits  URINALYSIS, ROUTINE W REFLEX MICROSCOPIC  PROCALCITONIN  PROCALCITONIN  LACTIC ACID, PLASMA  LACTIC ACID, PLASMA  LACTATE DEHYDROGENASE  FERRITIN  TRIGLYCERIDES  C-REACTIVE PROTEIN  BRAIN NATRIURETIC PEPTIDE  FIBRIN DERIVATIVES D-DIMER ([facility] ONLY)  FIBRINOGEN  POC SARS CORONAVIRUS 2 AG -  ED  TROPONIN I (HIGH SENSITIVITY)   ____________________________________________   ED ECG REPORT I, Vanessa [place], the attending physician, personally viewed and interpreted this ECG.  EKG is normal sinus rate of 78, no ST elevation, no T wave inversions, normal intervals ____________________________________________  RADIOLOGY Robert Bellow, personally viewed and evaluated these images (plain radiographs) as part of my medical decision making, as well as reviewing the written report by the radiologist.  ED MD interpretation: Concern for multifocal pneumonia.  Official radiology report(s): DG Chest 2 View  Result Date: 04/29/2019 CLINICAL DATA:  Cough and hypoxia EXAM: CHEST - 2 VIEW COMPARISON:  February 07, 2014 FINDINGS: There is multifocal airspace opacity with somewhat greater opacity in the right mid and lower lung zones compared to the left side where opacity is mainly in the left lower lung region. Heart size and pulmonary vascularity are normal. No adenopathy. There is mild degenerative change in the thoracic spine. IMPRESSION: Multifocal pneumonia, more notable in the right mid and lower lung zones than elsewhere. Cardiac silhouette within normal limits. No adenopathy. Electronically Signed   By: Lowella Grip III M.D.   On: 04/29/2019 14:35    ____________________________________________   PROCEDURES  Procedure(s) performed (including Critical Care):  .Critical Care Performed by: Vanessa [place], MD Authorized by: Vanessa [place], MD   Critical care provider statement:    Critical care time  (minutes):  35   Critical care was necessary to treat or prevent imminent or life-threatening deterioration of the following conditions:  Respiratory failure   Critical care was time spent personally by me on the following activities:  Discussions with consultants, evaluation of patient's response to treatment, examination of patient, ordering and performing treatments and interventions, ordering and review of laboratory studies,  ordering and review of radiographic studies, pulse oximetry, re-evaluation of patient's condition, obtaining history from patient or surrogate and review of old charts     ____________________________________________   INITIAL IMPRESSION / [place] / ED COURSE   Betania Dizon Chumney was evaluated in Emergency Department on 04/29/2019 for the symptoms described in the history of present illness. She was evaluated in the context of the global COVID-19 pandemic, which necessitated consideration that the patient might be at risk for infection with the SARS-CoV-2 virus that causes COVID-19. Institutional protocols and algorithms that pertain to the evaluation of patients at risk for COVID-19 are in a state of rapid change based on information released by regulatory bodies including the CDC and federal and state organizations. These policies and algorithms were followed during the patient's care in the ED.     Pt presents with SOB. Most likely COVID will get testing.   PNA-will get xray to evaluation Anemia-CBC to evaluate ACS- will get trops Arrhythmia-Will get EKG and keep on monitor.  PE-lower suspicion given no risk factors and other cause more likely  Chest x-ray consistent with Covid pneumonia.  Patient was hypoxic to 87% and so placed on 2 L.  Will give Decadron and remdesivir and discussed with hospital team for admission              ____________________________________________   FINAL CLINICAL IMPRESSION(S) / ED DIAGNOSES   Final  diagnoses:  Acute respiratory failure with hypoxia ([place])  COVID-19     MEDICATIONS GIVEN DURING THIS VISIT:  Medications  dexamethasone (DECADRON) injection 6 mg (has no administration in time range)     ED Discharge Orders    None       Note:  This document was prepared using Dragon voice recognition software and may include unintentional dictation errors.   Vanessa , MD 04/29/19 2034

## 2019-04-30 ENCOUNTER — Inpatient Hospital Stay (HOSPITAL_COMMUNITY)
Admission: AD | Admit: 2019-04-30 | Discharge: 2019-05-03 | DRG: 177 | Disposition: A | Discharge status: Home / Self Care | Payer: Medicare HMO | Source: Other Acute Inpatient Hospital | Attending: Internal Medicine | Admitting: Internal Medicine

## 2019-04-30 ENCOUNTER — Encounter (HOSPITAL_COMMUNITY): Payer: Self-pay | Admitting: Internal Medicine

## 2019-04-30 ENCOUNTER — Other Ambulatory Visit: Payer: Self-pay

## 2019-04-30 DIAGNOSIS — G4733 Obstructive sleep apnea (adult) (pediatric): Secondary | ICD-10-CM | POA: Diagnosis not present

## 2019-04-30 DIAGNOSIS — E785 Hyperlipidemia, unspecified: Secondary | ICD-10-CM | POA: Diagnosis present

## 2019-04-30 DIAGNOSIS — E669 Obesity, unspecified: Secondary | ICD-10-CM | POA: Diagnosis present

## 2019-04-30 DIAGNOSIS — Z8249 Family history of ischemic heart disease and other diseases of the circulatory system: Secondary | ICD-10-CM | POA: Diagnosis not present

## 2019-04-30 DIAGNOSIS — Z79899 Other long term (current) drug therapy: Secondary | ICD-10-CM | POA: Diagnosis not present

## 2019-04-30 DIAGNOSIS — Z7982 Long term (current) use of aspirin: Secondary | ICD-10-CM | POA: Diagnosis not present

## 2019-04-30 DIAGNOSIS — J1282 Pneumonia due to coronavirus disease 2019: Secondary | ICD-10-CM | POA: Diagnosis not present

## 2019-04-30 DIAGNOSIS — J9601 Acute respiratory failure with hypoxia: Secondary | ICD-10-CM | POA: Diagnosis not present

## 2019-04-30 DIAGNOSIS — Z803 Family history of malignant neoplasm of breast: Secondary | ICD-10-CM

## 2019-04-30 DIAGNOSIS — Z7951 Long term (current) use of inhaled steroids: Secondary | ICD-10-CM | POA: Diagnosis not present

## 2019-04-30 DIAGNOSIS — R0902 Hypoxemia: Secondary | ICD-10-CM | POA: Diagnosis not present

## 2019-04-30 DIAGNOSIS — K219 Gastro-esophageal reflux disease without esophagitis: Secondary | ICD-10-CM | POA: Diagnosis present

## 2019-04-30 DIAGNOSIS — Z6839 Body mass index (BMI) 39.0-39.9, adult: Secondary | ICD-10-CM | POA: Diagnosis not present

## 2019-04-30 DIAGNOSIS — E119 Type 2 diabetes mellitus without complications: Secondary | ICD-10-CM | POA: Diagnosis not present

## 2019-04-30 DIAGNOSIS — Z7984 Long term (current) use of oral hypoglycemic drugs: Secondary | ICD-10-CM

## 2019-04-30 DIAGNOSIS — Z23 Encounter for immunization: Secondary | ICD-10-CM | POA: Diagnosis not present

## 2019-04-30 DIAGNOSIS — I10 Essential (primary) hypertension: Secondary | ICD-10-CM | POA: Diagnosis present

## 2019-04-30 DIAGNOSIS — Z87891 Personal history of nicotine dependence: Secondary | ICD-10-CM | POA: Diagnosis not present

## 2019-04-30 DIAGNOSIS — J440 Chronic obstructive pulmonary disease with acute lower respiratory infection: Secondary | ICD-10-CM | POA: Diagnosis present

## 2019-04-30 DIAGNOSIS — U071 COVID-19: Principal | ICD-10-CM | POA: Diagnosis present

## 2019-04-30 LAB — CBC WITH DIFFERENTIAL/PLATELET
Abs Immature Granulocytes: 0.17 10*3/uL — ABNORMAL HIGH (ref 0.00–0.07)
Basophils Absolute: 0 10*3/uL (ref 0.0–0.1)
Basophils Relative: 0 %
Eosinophils Absolute: 0 10*3/uL (ref 0.0–0.5)
Eosinophils Relative: 0 %
HCT: 37.5 % (ref 36.0–46.0)
Hemoglobin: 11.2 g/dL — ABNORMAL LOW (ref 12.0–15.0)
Immature Granulocytes: 2 %
Lymphocytes Relative: 8 %
Lymphs Abs: 0.6 10*3/uL — ABNORMAL LOW (ref 0.7–4.0)
MCH: 24.2 pg — ABNORMAL LOW (ref 26.0–34.0)
MCHC: 29.9 g/dL — ABNORMAL LOW (ref 30.0–36.0)
MCV: 81 fL (ref 80.0–100.0)
Monocytes Absolute: 0.1 10*3/uL (ref 0.1–1.0)
Monocytes Relative: 2 %
Neutro Abs: 7 10*3/uL (ref 1.7–7.7)
Neutrophils Relative %: 88 %
Platelets: 454 10*3/uL — ABNORMAL HIGH (ref 150–400)
RBC: 4.63 MIL/uL (ref 3.87–5.11)
RDW: 13.9 % (ref 11.5–15.5)
WBC: 8 10*3/uL (ref 4.0–10.5)
nRBC: 0.3 % — ABNORMAL HIGH (ref 0.0–0.2)

## 2019-04-30 LAB — URINALYSIS, ROUTINE W REFLEX MICROSCOPIC
Bilirubin Urine: NEGATIVE
Glucose, UA: NEGATIVE mg/dL
Hgb urine dipstick: NEGATIVE
Ketones, ur: NEGATIVE mg/dL
Leukocytes,Ua: NEGATIVE
Nitrite: NEGATIVE
Protein, ur: NEGATIVE mg/dL
Specific Gravity, Urine: 1.019 (ref 1.005–1.030)
pH: 7 (ref 5.0–8.0)

## 2019-04-30 LAB — COMPREHENSIVE METABOLIC PANEL
ALT: 20 U/L (ref 0–44)
AST: 25 U/L (ref 15–41)
Albumin: 2.6 g/dL — ABNORMAL LOW (ref 3.5–5.0)
Alkaline Phosphatase: 58 U/L (ref 38–126)
Anion gap: 11 (ref 5–15)
BUN: 17 mg/dL (ref 8–23)
CO2: 25 mmol/L (ref 22–32)
Calcium: 8.9 mg/dL (ref 8.9–10.3)
Chloride: 107 mmol/L (ref 98–111)
Creatinine, Ser: 0.84 mg/dL (ref 0.44–1.00)
GFR calc Af Amer: >60 mL/min (ref >60)
GFR calc non Af Amer: >60 mL/min (ref >60)
Glucose, Bld: 156 mg/dL — ABNORMAL HIGH (ref 70–99)
Potassium: 3.8 mmol/L (ref 3.5–5.1)
Sodium: 143 mmol/L (ref 135–145)
Total Bilirubin: 0.6 mg/dL (ref 0.3–1.2)
Total Protein: 7.5 g/dL (ref 6.5–8.1)

## 2019-04-30 LAB — GLUCOSE, CAPILLARY
Glucose-Capillary: 108 mg/dL — ABNORMAL HIGH (ref 70–99)
Glucose-Capillary: 119 mg/dL — ABNORMAL HIGH (ref 70–99)
Glucose-Capillary: 132 mg/dL — ABNORMAL HIGH (ref 70–99)
Glucose-Capillary: 132 mg/dL — ABNORMAL HIGH (ref 70–99)
Glucose-Capillary: 160 mg/dL — ABNORMAL HIGH (ref 70–99)

## 2019-04-30 LAB — EXPECTORATED SPUTUM ASSESSMENT W GRAM STAIN, RFLX TO RESP C

## 2019-04-30 LAB — PROCALCITONIN: Procalcitonin: <0.1 ng/mL

## 2019-04-30 LAB — FERRITIN: Ferritin: 148 ng/mL (ref 11–307)

## 2019-04-30 LAB — C-REACTIVE PROTEIN: CRP: 8.4 mg/dL — ABNORMAL HIGH (ref <1.0)

## 2019-04-30 LAB — FIBRIN DERIVATIVES D-DIMER (ARMC ONLY): Fibrin derivatives D-dimer (ARMC): 3386.86 ng/mL (FEU) — ABNORMAL HIGH (ref 0.00–499.00)

## 2019-04-30 MED ORDER — MONTELUKAST SODIUM 10 MG PO TABS
10.0000 mg | ORAL_TABLET | Freq: Every day | ORAL | Status: DC
  Administered 2019-04-30 – 2019-05-02 (×3): 10 mg via ORAL
  Filled 2019-04-30 (×3): qty 1

## 2019-04-30 MED ORDER — ASPIRIN 81 MG PO TBEC
81.0000 mg | DELAYED_RELEASE_TABLET | Freq: Every day | ORAL | Status: DC
  Administered 2019-05-01 – 2019-05-03 (×3): 81 mg via ORAL
  Filled 2019-04-30 (×5): qty 1

## 2019-04-30 MED ORDER — LINAGLIPTIN 5 MG PO TABS
5.0000 mg | ORAL_TABLET | Freq: Every day | ORAL | Status: DC
  Administered 2019-05-01 – 2019-05-03 (×3): 5 mg via ORAL
  Filled 2019-04-30 (×2): qty 1

## 2019-04-30 MED ORDER — ENOXAPARIN SODIUM 60 MG/0.6ML ~~LOC~~ SOLN
50.0000 mg | SUBCUTANEOUS | Status: DC
  Administered 2019-04-30: 50 mg via SUBCUTANEOUS
  Filled 2019-04-30: qty 0.6

## 2019-04-30 MED ORDER — LINAGLIPTIN 5 MG PO TABS
5.0000 mg | ORAL_TABLET | Freq: Every day | ORAL | Status: DC
  Administered 2019-04-30: 11:00:00 5 mg via ORAL
  Filled 2019-04-30: qty 1

## 2019-04-30 MED ORDER — INFLUENZA VAC A&B SA ADJ QUAD 0.5 ML IM PRSY
0.5000 mL | PREFILLED_SYRINGE | INTRAMUSCULAR | Status: AC
  Administered 2019-05-02: 0.5 mL via INTRAMUSCULAR
  Filled 2019-04-30 (×2): qty 0.5

## 2019-04-30 MED ORDER — ACETAMINOPHEN 325 MG PO TABS: 650.0000 mg | ORAL_TABLET | Freq: Four times a day (QID) | ORAL | Status: DC | PRN

## 2019-04-30 MED ORDER — SENNOSIDES-DOCUSATE SODIUM 8.6-50 MG PO TABS: 1.0000 | ORAL_TABLET | Freq: Every evening | ORAL | Status: DC | PRN

## 2019-04-30 MED ORDER — PNEUMOCOCCAL VAC POLYVALENT 25 MCG/0.5ML IJ INJ
0.5000 mL | INJECTION | INTRAMUSCULAR | Status: AC
  Administered 2019-05-02: 13:00:00 0.5 mL via INTRAMUSCULAR
  Filled 2019-04-30 (×2): qty 0.5

## 2019-04-30 MED ORDER — IPRATROPIUM-ALBUTEROL 20-100 MCG/ACT IN AERS
1.0000 | INHALATION_SPRAY | Freq: Four times a day (QID) | RESPIRATORY_TRACT | Status: DC
  Administered 2019-04-30 – 2019-05-03 (×10): 1 via RESPIRATORY_TRACT
  Filled 2019-04-30: qty 4

## 2019-04-30 MED ORDER — DOCUSATE SODIUM 100 MG PO CAPS
100.0000 mg | ORAL_CAPSULE | Freq: Every day | ORAL | Status: DC
  Administered 2019-05-01 – 2019-05-03 (×3): 100 mg via ORAL
  Filled 2019-04-30 (×3): qty 1

## 2019-04-30 MED ORDER — PANTOPRAZOLE SODIUM 40 MG PO TBEC
40.0000 mg | DELAYED_RELEASE_TABLET | Freq: Every day | ORAL | Status: DC
  Administered 2019-05-01 – 2019-05-03 (×3): 40 mg via ORAL
  Filled 2019-04-30 (×3): qty 1

## 2019-04-30 MED ORDER — TRIAMTERENE-HCTZ 37.5-25 MG PO CAPS
1.0000 | ORAL_CAPSULE | Freq: Every day | ORAL | Status: DC
  Administered 2019-05-01 – 2019-05-03 (×3): 1 via ORAL
  Filled 2019-04-30 (×3): qty 1

## 2019-04-30 MED ORDER — DEXAMETHASONE 6 MG PO TABS
6.0000 mg | ORAL_TABLET | ORAL | Status: DC
  Administered 2019-04-30: 6 mg via ORAL
  Filled 2019-04-30: qty 1

## 2019-04-30 MED ORDER — VITAMIN B-12 100 MCG PO TABS
100.0000 ug | ORAL_TABLET | Freq: Every day | ORAL | Status: DC
  Administered 2019-05-01 – 2019-05-03 (×3): 100 ug via ORAL
  Filled 2019-04-30 (×3): qty 1

## 2019-04-30 MED ORDER — CALCIUM CARBONATE 1250 (500 CA) MG PO TABS
500.0000 mg | ORAL_TABLET | Freq: Two times a day (BID) | ORAL | Status: DC
  Administered 2019-05-01 – 2019-05-03 (×4): 500 mg via ORAL
  Filled 2019-04-30 (×8): qty 1

## 2019-04-30 MED ORDER — ATORVASTATIN CALCIUM 40 MG PO TABS
40.0000 mg | ORAL_TABLET | Freq: Every day | ORAL | Status: DC
  Administered 2019-04-30 – 2019-05-03 (×4): 40 mg via ORAL
  Filled 2019-04-30 (×3): qty 1

## 2019-04-30 MED ORDER — ALBUTEROL SULFATE HFA 108 (90 BASE) MCG/ACT IN AERS
1.0000 | INHALATION_SPRAY | RESPIRATORY_TRACT | Status: DC | PRN
  Filled 2019-04-30: qty 6.7

## 2019-04-30 MED ORDER — UMECLIDINIUM-VILANTEROL 62.5-25 MCG/INH IN AEPB
1.0000 | INHALATION_SPRAY | Freq: Every day | RESPIRATORY_TRACT | Status: DC
  Administered 2019-05-01 – 2019-05-03 (×3): 1 via RESPIRATORY_TRACT
  Filled 2019-04-30: qty 14

## 2019-04-30 MED ORDER — INSULIN ASPART 100 UNIT/ML ~~LOC~~ SOLN
0.0000 [IU] | SUBCUTANEOUS | Status: DC
  Administered 2019-05-01: 2 [IU] via SUBCUTANEOUS
  Administered 2019-05-01: 3 [IU] via SUBCUTANEOUS
  Administered 2019-05-02 (×2): 2 [IU] via SUBCUTANEOUS
  Administered 2019-05-02 – 2019-05-03 (×2): 3 [IU] via SUBCUTANEOUS

## 2019-04-30 MED ORDER — GUAIFENESIN-DM 100-10 MG/5ML PO SYRP: 10.0000 mL | ORAL_SOLUTION | ORAL | Status: DC | PRN

## 2019-04-30 MED ORDER — SODIUM CHLORIDE 0.9 % IV SOLN
100.0000 mg | Freq: Every day | INTRAVENOUS | Status: AC
  Administered 2019-05-01 – 2019-05-03 (×3): 100 mg via INTRAVENOUS
  Filled 2019-04-30 (×3): qty 20

## 2019-04-30 MED ORDER — HYDROCOD POLST-CPM POLST ER 10-8 MG/5ML PO SUER: 5.0000 mL | Freq: Two times a day (BID) | ORAL | Status: DC | PRN

## 2019-04-30 NOTE — ED Notes (Signed)
This tech order pt's breakfast form dietary.

## 2019-04-30 NOTE — Discharge Summary (Addendum)
[facility] discharge summary for transfer to Alexandra Foster DOB: 12/09/1950 DOA: 04/29/2019 PCP: Steele Sizer, MD   Date of discharge 04/30/2019    Brief Narrative:  [name] is a 69 y.o. F with asthma/COPD, DM, HTN, MO/OSA on CPAP who presented with few days cough and recent COVID exposure.  Went to UC on day of admission, there was noted to be hypoxic, sent to ER.  In the ER, SpO2 87% on room air and respiratory rate 23-27 per minute. CXR showed multifocal pneumonia.        Assessment & Plan:  Coronavirus pneumonia with acute hypoxic respiratory failure Patient presented with respiratory rate 23 to 27/min, SPO2 87% on room air, multifocal pneumonia on chest x-ray in the setting of the ongoing 2020-2021 COVID-19 pandemic.  -Continue remdesivir, day 2 -Continue steroids, day 2 -Continue zinc and vitamin C    Asthma/COPD No wheezing on exam -Continue steroids -Continue LABA/LAMA -Continue montelukast -Combivent as needed  Diabetes Glucose is stable  -Continue sliding scale corrections -Continue aspirin and atorvastatin -Hold Metformin -Start linagliptin  OSA BMI 39 -Continue Pepcid, pantoprazole -CPAP deferred given Covid  Hypertension Blood pressure controlled -Continue triamterene, hydrochlorothiazide      Disposition: The patient was admitted with COVID-19.   I will transfer to the [facility] for systemwide protocol.        MDM: The below labs and imaging reports were reviewed and summarized above.  Medication management as above.      DVT prophylaxis: Lovenox Code Status: Full code Family Communication:      Subjective: Patient has a cough, but actually feels fairly well.  Would like to shower.  Overnight, still on 2 L supplemental oxygen.  No confusion, vomiting, diarrhea.  Objective: Vitals:   04/29/19 1937 04/29/19 2025 04/29/19 2242 04/30/19 0729  BP: (!) 117/59   (!)  120/57  Pulse: 77  78 69  Resp: 20  (!) 27 (!) 23  Temp:    98.4 F (36.9 C)  TempSrc:    Oral  SpO2: 94% (!) 89% (!) 89% 91%  Weight:      Height:        Intake/Output Summary (Last 24 hours) at 04/30/2019 1129 Last data filed at 04/30/2019 0532 Gross per 24 hour  Intake --  Output 1 ml  Net -1 ml   Filed Weights   04/29/19 1345  Weight: 106.6 kg    Examination: General appearance: Obese adult female, alert and in no acute distress.  No acute sitting on the edge of the bed HEENT: Anicteric, conjunctiva pink, lids and lashes normal. No nasal deformity, discharge, epistaxis.  Lips moist, dentition normal, oropharynx moist, no oral lesions, hearing normal.   Skin: Warm and dry.    No suspicious rashes or lesions. Cardiac: Tachycardic, regular, nl S1-S2, no murmurs appreciated.  Capillary refill is brisk.  JVP not visible.  No LE edema.  Radial pulses 2+ and symmetric. Respiratory: Tachypneic with exertion, no rales or wheezes appreciated, lung sounds diminished bilaterally. Abdomen: Abdomen soft.  No TTP or guarding. No ascites, distension, hepatosplenomegaly.   MSK: No deformities or effusions. Neuro: Awake and alert.  EOMI, moves all extremities. Speech fluent.    Psych: Sensorium intact and responding to questions, attention normal. Affect normal.  Judgment and insight appear normal.    Data Reviewed: I have personally reviewed following labs and imaging studies:  CBC: Recent Labs  Lab 04/29/19 1351 04/30/19 0530  WBC 9.4 8.0  NEUTROABS 7.8* 7.0  HGB 11.7* 11.2*  HCT 38.3 37.5  MCV 79.5* 81.0  PLT 440* 962*   Basic Metabolic Panel: Recent Labs  Lab 04/29/19 1351 04/30/19 0530  NA 141 143  K 3.1* 3.8  CL 101 107  CO2 27 25  GLUCOSE 100* 156*  BUN 16 17  CREATININE 0.90 0.84  CALCIUM 8.8* 8.9   GFR: Estimated Creatinine Clearance: 75.6 mL/min (by C-G formula based on SCr of 0.84 mg/dL). Liver Function Tests: Recent Labs  Lab 04/29/19 1351  04/30/19 0530  AST 27 25  ALT 21 20  ALKPHOS 63 58  BILITOT 0.8 0.6  PROT 7.9 7.5  ALBUMIN 2.8* 2.6*   No results for input(s): LIPASE, AMYLASE in the last 168 hours. No results for input(s): AMMONIA in the last 168 hours. Coagulation Profile: No results for input(s): INR, PROTIME in the last 168 hours. Cardiac Enzymes: No results for input(s): CKTOTAL, CKMB, CKMBINDEX, TROPONINI in the last 168 hours. BNP (last 3 results) No results for input(s): PROBNP in the last 8760 hours. HbA1C: No results for input(s): HGBA1C in the last 72 hours. CBG: Recent Labs  Lab 04/29/19 2250 04/30/19 0515 04/30/19 0734  GLUCAP 137* 160* 119*   Lipid Profile: Recent Labs    04/29/19 1933  TRIG 118   Thyroid Function Tests: No results for input(s): TSH, T4TOTAL, FREET4, T3FREE, THYROIDAB in the last 72 hours. Anemia Panel: Recent Labs    04/29/19 1933 04/30/19 0530  FERRITIN 136 148   Urine analysis: No results found for: COLORURINE, APPEARANCEUR, LABSPEC, PHURINE, GLUCOSEU, HGBUR, BILIRUBINUR, KETONESUR, PROTEINUR, UROBILINOGEN, NITRITE, LEUKOCYTESUR Sepsis Labs: @LABRCNTIP (procalcitonin:4,lacticacidven:4)  ) Recent Results (from the past 240 hour(s))  Culture, blood (Routine X 2) w Reflex to ID Panel     Status: None (Preliminary result)   Collection Time: 04/29/19 10:01 PM   Specimen: BLOOD  Result Value Ref Range Status   Specimen Description BLOOD RIGHT ARM  Final   Special Requests   Final    BOTTLES DRAWN AEROBIC AND ANAEROBIC Blood Culture results may not be optimal due to an excessive volume of blood received in culture bottles   Culture   Final    NO GROWTH < 12 HOURS Performed at [facility], [address], [place], [place] [address]    Report Status PENDING  Incomplete  Culture, blood (Routine X 2) w Reflex to ID Panel     Status: None (Preliminary result)   Collection Time: 04/29/19 10:01 PM   Specimen: BLOOD  Result Value Ref Range Status    Specimen Description BLOOD RIGHT FOREARM  Final   Special Requests   Final    BOTTLES DRAWN AEROBIC AND ANAEROBIC Blood Culture adequate volume   Culture   Final    NO GROWTH < 12 HOURS Performed at [facility], [address] [place], [place] [address]    Report Status PENDING  Incomplete         Radiology Studies: DG Chest 2 View  Result Date: 04/29/2019 CLINICAL DATA:  Cough and hypoxia EXAM: CHEST - 2 VIEW COMPARISON:  February 07, 2014 FINDINGS: There is multifocal airspace opacity with somewhat greater opacity in the right mid and lower lung zones compared to the left side where opacity is mainly in the left lower lung region. Heart size and pulmonary vascularity are normal. No adenopathy. There is mild degenerative change in the thoracic spine. IMPRESSION: Multifocal pneumonia, more notable in the right mid and lower  lung zones than elsewhere. Cardiac silhouette within normal limits. No adenopathy. Electronically Signed   By: Lowella Grip III M.D.   On: 04/29/2019 14:35        Scheduled Meds: . vitamin C  500 mg Oral Daily  . aspirin EC  81 mg Oral Daily  . atorvastatin  40 mg Oral Daily  . cholecalciferol  1,000 Units Oral Daily  . dexamethasone (DECADRON) injection  6 mg Intravenous Q24H  . docusate sodium  100 mg Oral Daily  . enoxaparin (LOVENOX) injection  40 mg Subcutaneous Q24H  . famotidine  20 mg Oral BID  . guaiFENesin  600 mg Oral BID  . insulin aspart  0-15 Units Subcutaneous Q4H  . linagliptin  5 mg Oral Daily  . montelukast  10 mg Oral QHS  . pantoprazole  40 mg Oral Daily  . triamterene-hydrochlorothiazide  1 tablet Oral Daily  . umeclidinium-vilanterol  1 puff Inhalation Daily  . vitamin B-12  100 mcg Oral Daily  . zinc sulfate  220 mg Oral Daily   Continuous Infusions: . remdesivir 100 mg in NS 100 mL 100 mg (04/30/19 1117)     LOS: 1 day    Time spent: 25 minutes    Edwin Dada, MD [facility] 04/30/2019,  11:29 AM     Please page though [place] or Epic secure chat:  For [employer], Adult nurse

## 2019-04-30 NOTE — ED Notes (Signed)
Transfer consent signed by pt on paper- electronic signature not working.

## 2019-04-30 NOTE — H&P (Signed)
History and Physical    Alexandra Foster MIW:[id number] DOB: 01-Apr-1951 DOA: 04/30/2019  PCP: Steele Sizer, MD  Patient coming from: [facility]  I have personally briefly reviewed patient's old medical records in [place]  Chief Complaint: SOB  HPI: Alexandra Foster is a 69 y.o. female with medical history significant of Asthma/COPD, T2DM, HTN, OSA on CPAP initially admitted to [facility] with hypoxemic respiratory failure and COVID-19 pneumonia presenting in transfer to [facility] today.  Patient has been since been treated per team at [facility] with Remdesivir and Steroids with improvement in how she feels.  She has been having symptoms x 1 week. She continues to have cough and sob but overall feels well.  No reported fevers or chills.  She denies any diarrhea.  She is presently feeling comfortable, tired.  Former smoker, no alcohol or drugs  Review of Systems: As per HPI otherwise 10 point review of systems negative.    Past Medical History:  Diagnosis Date  . Arthritis   . Asthma   . COPD (chronic obstructive pulmonary disease) ([place])   . Diabetes mellitus without complication ([place])   . Endometriosis   . GERD (gastroesophageal reflux disease)   . Hypertension   . Obesity   . Sleep apnea    CPAP    Past Surgical History:  Procedure Laterality Date  . ABDOMINAL HYSTERECTOMY    . COLONOSCOPY  10/29/2006   Dr Allen Norris  . COLONOSCOPY WITH PROPOFOL N/A 11/05/2016   Procedure: COLONOSCOPY WITH PROPOFOL;  Surgeon: Robert Bellow, MD;  Location: [facility] ENDOSCOPY;  Service: Endoscopy;  Laterality: N/A;  . NASAL SINUS SURGERY  2002   Dr Carlis Abbott     reports that she quit smoking about 13 years ago. Her smoking use included cigarettes. She has a 80.00 pack-year smoking history. She quit smokeless tobacco use about 18 years ago.  Her smokeless tobacco use included snuff. She reports that she does not drink alcohol or use drugs.  No Known Allergies  Family History  Problem Relation Age of  Onset  . Congestive Heart Failure Mother   . Coronary artery disease Father   . Breast cancer Sister 84  . Heart disease Brother   . Varicose Veins Brother   . Alcohol abuse Brother      Prior to Admission medications   Medication Sig Start Date End Date Taking? Authorizing Provider  albuterol (PROVENTIL HFA;VENTOLIN HFA) 108 (90 Base) MCG/ACT inhaler Inhale 1-2 puffs into the lungs every 4 (four) hours as needed for wheezing or shortness of breath. 04/23/18   Hubbard Hartshorn, FNP  albuterol (PROVENTIL) (2.5 MG/3ML) 0.083% nebulizer solution Take 3 mLs (2.5 mg total) by nebulization every 6 (six) hours as needed for wheezing or shortness of breath. 04/23/18   Hubbard Hartshorn, FNP  aspirin 81 MG tablet Take 1 tablet (81 mg total) by mouth daily. 01/08/16   Steele Sizer, MD  atorvastatin (LIPITOR) 40 MG tablet TAKE 1 TABLET EVERY DAY 03/22/19   Steele Sizer, MD  Blood Glucose Calibration (ACCU-CHEK AVIVA) SOLN 1 each by In Vitro route daily. 12/08/17   Steele Sizer, MD  Blood Glucose Monitoring Suppl (ACCU-CHEK AVIVA PLUS) w/Device KIT USE AS DIRECTED 01/13/19   Ancil Boozer, Drue Stager, MD  calcium carbonate (OSCAL) 1500 (600 Ca) MG TABS tablet Take 600 mg of elemental calcium by mouth 2 (two) times daily with a meal.    [provider]  docusate sodium (COLACE) 100 MG capsule Take 100 mg by mouth daily.  [provider]  glucose blood (ACCU-CHEK AVIVA PLUS) test strip Use as instructed 12/08/17   Steele Sizer, MD  Lancets (ACCU-CHEK SOFT TOUCH) lancets Use as instructed 12/08/17   Steele Sizer, MD  metFORMIN (GLUCOPHAGE-XR) 750 MG 24 hr tablet TAKE 1 TABLET DAILY WITH BREAKFAST. 12/16/18   Sowles, Drue Stager, MD  montelukast (SINGULAIR) 10 MG tablet Take 10 mg by mouth at bedtime.    [provider]  Multiple Vitamins-Minerals (MULTIVITAL PO) Take 1 tablet by mouth daily.    [provider]  omeprazole (PRILOSEC) 20 MG capsule TAKE 1 CAPSULE EVERY DAY 12/16/18    Steele Sizer, MD  Promethazine-Codeine 6.25-10 MG/5ML SOLN Take 5 mLs by mouth 2 (two) times daily. 04/28/19   Steele Sizer, MD  triamterene-hydrochlorothiazide (DYAZIDE) 37.5-25 MG capsule TAKE 1 CAPSULE EVERY DAY 03/03/19   Sowles, Drue Stager, MD  umeclidinium-vilanterol [facility] ELLIPTA) 62.5-25 MCG/INH AEPB Inhale 1 puff into the lungs daily. 04/23/18   Hubbard Hartshorn, FNP  vitamin B-12 (CYANOCOBALAMIN) 100 MCG tablet Take 100 mcg by mouth daily.    [provider]    Physical Exam: Vitals:   04/30/19 1844 04/30/19 1900  BP: (!) 123/58   Pulse:  64  Resp:  20  SpO2:  95%    Vitals:   04/30/19 1844 04/30/19 1900  BP: (!) 123/58   Pulse:  64  Resp:  20  SpO2:  95%     Constitutional: NAD, calm, comfortable, tachypneic Eyes: PERRL, lids and conjunctivae normal, EOMI ENMT: Mucous membranes are moist. Posterior pharynx clear of any exudate or lesions.Normal dentition.  Neck: normal, supple, no masses, no thyromegaly Respiratory: Posterior crackles appreciated. Normal respiratory effort. No accessory muscle use.  Exam limited by PPE Cardiovascular: Regular rate and rhythm, no murmurs / rubs / gallops. No extremity edema. 2+ pedal pulses.   Abdomen: no tenderness, no masses palpated. No hepatosplenomegaly. Bowel sounds positive.  Musculoskeletal: no clubbing / cyanosis. No joint deformity upper and lower extremities. Good ROM, no contractures. Normal muscle tone.  Skin: no rashes, lesions, ulcers. No induration Neurologic: CN 2-12 grossly intact. Sensation and strength intact Psychiatric: Normal judgment and insight. Alert and oriented x 3. Normal mood.    Labs on Admission: I have personally reviewed following labs and imaging studies  CBC: Recent Labs  Lab 04/29/19 1351 04/30/19 0530  WBC 9.4 8.0  NEUTROABS 7.8* 7.0  HGB 11.7* 11.2*  HCT 38.3 37.5  MCV 79.5* 81.0  PLT 440* 053*   Basic Metabolic Panel: Recent Labs  Lab 04/29/19 1351 04/30/19 0530  NA  141 143  K 3.1* 3.8  CL 101 107  CO2 27 25  GLUCOSE 100* 156*  BUN 16 17  CREATININE 0.90 0.84  CALCIUM 8.8* 8.9   GFR: Estimated Creatinine Clearance: 75.6 mL/min (by C-G formula based on SCr of 0.84 mg/dL). Liver Function Tests: Recent Labs  Lab 04/29/19 1351 04/30/19 0530  AST 27 25  ALT 21 20  ALKPHOS 63 58  BILITOT 0.8 0.6  PROT 7.9 7.5  ALBUMIN 2.8* 2.6*   No results for input(s): LIPASE, AMYLASE in the last 168 hours. No results for input(s): AMMONIA in the last 168 hours. Coagulation Profile: No results for input(s): INR, PROTIME in the last 168 hours. Cardiac Enzymes: No results for input(s): CKTOTAL, CKMB, CKMBINDEX, TROPONINI in the last 168 hours. BNP (last 3 results) No results for input(s): PROBNP in the last 8760 hours. HbA1C: No results for input(s): HGBA1C in the last 72 hours. CBG: Recent  Labs  Lab 04/29/19 2250 04/30/19 0515 04/30/19 0734 04/30/19 1244 04/30/19 1938  GLUCAP 137* 160* 119* 132* 108*   Lipid Profile: Recent Labs    04/29/19 1933  TRIG 118   Thyroid Function Tests: No results for input(s): TSH, T4TOTAL, FREET4, T3FREE, THYROIDAB in the last 72 hours. Anemia Panel: Recent Labs    04/29/19 1933 04/30/19 0530  FERRITIN 136 148   Urine analysis:    Component Value Date/Time   COLORURINE YELLOW (A) 04/30/2019 1540   APPEARANCEUR CLEAR (A) 04/30/2019 1540   LABSPEC 1.019 04/30/2019 1540   PHURINE 7.0 04/30/2019 1540   GLUCOSEU NEGATIVE 04/30/2019 1540   HGBUR NEGATIVE 04/30/2019 1540   BILIRUBINUR NEGATIVE 04/30/2019 1540   KETONESUR NEGATIVE 04/30/2019 1540   PROTEINUR NEGATIVE 04/30/2019 1540   NITRITE NEGATIVE 04/30/2019 [place] 04/30/2019 1540    Radiological Exams on Admission: DG Chest 2 View  Result Date: 04/29/2019 CLINICAL DATA:  Cough and hypoxia EXAM: CHEST - 2 VIEW COMPARISON:  February 07, 2014 FINDINGS: There is multifocal airspace opacity with somewhat greater opacity in the  right mid and lower lung zones compared to the left side where opacity is mainly in the left lower lung region. Heart size and pulmonary vascularity are normal. No adenopathy. There is mild degenerative change in the thoracic spine. IMPRESSION: Multifocal pneumonia, more notable in the right mid and lower lung zones than elsewhere. Cardiac silhouette within normal limits. No adenopathy. Electronically Signed   By: Lowella Grip III M.D.   On: 04/29/2019 14:35    EKG: Independently reviewed.   Assessment/Plan Alexandra Foster is a 69 y.o. female with medical history significant of Asthma/COPD, T2DM, HTN, OSA on CPAP initially admitted to [facility] with hypoxemic respiratory failure and COVID-19 pneumonia presenting in transfer to [facility] today.  # Acute Hypoxemic Respiratory Failure # COVID-19 Pneumonia # Asthma/COPD - continue COVID-19 Rx with Remdesivir, Dexamethasone and oxygen supplementation - PharmD to continue 100 mg dosing of Remdesivir (started at [facility]) - do not suspect concurrent bacterial pneumonia,procalcitonin low (was given abx on day of admission) - oxygen requirement at 3 L at this time - continue Vit C/Zinc, potential benefit outweighs harm at this time - continue inhalers, proning and ICS as tolerated - trend inflammatory markers  # T2DM - ISS and CBG - ordered linagliptin  # HTN - continue pta dyazide (triamterene-HCTZ)  # HLD - continue atorvastatin  # Obesity # OSA - BMI of 56.81 - complicates all aspects of care - holding CPAP at present   DVT prophylaxis: Lovenox Code Status: Full Code Admission status: Inpatient   Truddie Hidden MD [facility] Pager [phone]  If 7PM-7AM, please contact night-coverage [email or web address] Password [facility]  04/30/2019, 9:13 PM

## 2019-04-30 NOTE — ED Notes (Signed)
Meal given to pt.

## 2019-05-01 LAB — CBC WITH DIFFERENTIAL/PLATELET
Abs Immature Granulocytes: 0.23 10*3/uL — ABNORMAL HIGH (ref 0.00–0.07)
Basophils Absolute: 0 10*3/uL (ref 0.0–0.1)
Basophils Relative: 0 %
Eosinophils Absolute: 0.1 10*3/uL (ref 0.0–0.5)
Eosinophils Relative: 1 %
HCT: 35.8 % — ABNORMAL LOW (ref 36.0–46.0)
Hemoglobin: 10.7 g/dL — ABNORMAL LOW (ref 12.0–15.0)
Immature Granulocytes: 2 %
Lymphocytes Relative: 5 %
Lymphs Abs: 0.6 10*3/uL — ABNORMAL LOW (ref 0.7–4.0)
MCH: 24.2 pg — ABNORMAL LOW (ref 26.0–34.0)
MCHC: 29.9 g/dL — ABNORMAL LOW (ref 30.0–36.0)
MCV: 80.8 fL (ref 80.0–100.0)
Monocytes Absolute: 0.4 10*3/uL (ref 0.1–1.0)
Monocytes Relative: 3 %
Neutro Abs: 10.2 10*3/uL — ABNORMAL HIGH (ref 1.7–7.7)
Neutrophils Relative %: 89 %
Platelets: 488 10*3/uL — ABNORMAL HIGH (ref 150–400)
RBC: 4.43 MIL/uL (ref 3.87–5.11)
RDW: 14 % (ref 11.5–15.5)
WBC: 11.6 10*3/uL — ABNORMAL HIGH (ref 4.0–10.5)
nRBC: 0.2 % (ref 0.0–0.2)

## 2019-05-01 LAB — COMPREHENSIVE METABOLIC PANEL
ALT: 19 U/L (ref 0–44)
AST: 19 U/L (ref 15–41)
Albumin: 2.5 g/dL — ABNORMAL LOW (ref 3.5–5.0)
Alkaline Phosphatase: 56 U/L (ref 38–126)
Anion gap: 12 (ref 5–15)
BUN: 22 mg/dL (ref 8–23)
CO2: 26 mmol/L (ref 22–32)
Calcium: 9.5 mg/dL (ref 8.9–10.3)
Chloride: 105 mmol/L (ref 98–111)
Creatinine, Ser: 0.81 mg/dL (ref 0.44–1.00)
GFR calc Af Amer: >60 mL/min (ref >60)
GFR calc non Af Amer: >60 mL/min (ref >60)
Glucose, Bld: 130 mg/dL — ABNORMAL HIGH (ref 70–99)
Potassium: 3.5 mmol/L (ref 3.5–5.1)
Sodium: 143 mmol/L (ref 135–145)
Total Bilirubin: 0.4 mg/dL (ref 0.3–1.2)
Total Protein: 7 g/dL (ref 6.5–8.1)

## 2019-05-01 LAB — ABO/RH: ABO/RH(D): O POS

## 2019-05-01 LAB — GLUCOSE, CAPILLARY
Glucose-Capillary: 157 mg/dL — ABNORMAL HIGH (ref 70–99)
Glucose-Capillary: 128 mg/dL — ABNORMAL HIGH (ref 70–99)
Glucose-Capillary: 93 mg/dL (ref 70–99)
Glucose-Capillary: 98 mg/dL (ref 70–99)

## 2019-05-01 LAB — PHOSPHORUS: Phosphorus: 3.1 mg/dL (ref 2.5–4.6)

## 2019-05-01 LAB — MAGNESIUM: Magnesium: 2 mg/dL (ref 1.7–2.4)

## 2019-05-01 LAB — HIV ANTIBODY (ROUTINE TESTING W REFLEX): HIV Screen 4th Generation wRfx: NONREACTIVE

## 2019-05-01 LAB — FERRITIN: Ferritin: 126 ng/mL (ref 11–307)

## 2019-05-01 LAB — D-DIMER, QUANTITATIVE: D-Dimer, Quant: 3.09 ug/mL-FEU — ABNORMAL HIGH (ref 0.00–0.50)

## 2019-05-01 LAB — C-REACTIVE PROTEIN: CRP: 4.4 mg/dL — ABNORMAL HIGH (ref <1.0)

## 2019-05-01 MED ORDER — POTASSIUM CHLORIDE CRYS ER 20 MEQ PO TBCR
40.0000 meq | EXTENDED_RELEASE_TABLET | Freq: Once | ORAL | Status: AC
  Administered 2019-05-01: 40 meq via ORAL
  Filled 2019-05-01: qty 2

## 2019-05-01 MED ORDER — ENOXAPARIN SODIUM 60 MG/0.6ML ~~LOC~~ SOLN
0.5000 mg/kg | SUBCUTANEOUS | Status: DC
  Administered 2019-05-01 – 2019-05-02 (×2): 55 mg via SUBCUTANEOUS
  Filled 2019-05-01 (×3): qty 0.6

## 2019-05-01 MED ORDER — DEXAMETHASONE 4 MG PO TABS
4.0000 mg | ORAL_TABLET | ORAL | Status: DC
  Administered 2019-05-01: 4 mg via ORAL
  Filled 2019-05-01: qty 1

## 2019-05-01 NOTE — Progress Notes (Signed)
PROGRESS NOTE                                                                                                                                                                                                             Patient Demographics:    Alexandra Foster, is a 69 y.o. female, DOB - [birth year], [id number]  Outpatient Primary MD for the patient is Steele Sizer, MD    LOS - 1  Admit date - 04/30/2019    CC - SOB     Brief Narrative  Alexandra Foster is a 69 y.o. female with medical history significant of Asthma/COPD, T2DM, HTN, OSA on CPAP initially admitted to [facility] with hypoxemic respiratory failure and COVID-19 pneumonia presenting in transfer to [facility] on 04/30/2019.  She has been treated so far with IV steroids and remdesivir.   Subjective:    Alexandra Foster today has, No headache, No chest pain, No abdominal pain - No Nausea, No new weakness tingling or numbness, mild cough but improved shortness of breath.   Assessment  & Plan :     1. Acute Hypoxic Resp. Failure due to Acute Covid 19 Viral Pneumonitis during the ongoing 2020 Covid 19 Pandemic - she mild to moderate disease has been treated with IV steroids and remdesivir with good improvement, down to 2 to 3 L nasal cannula oxygen and relatively symptom-free except for cough, will continue to titrate down oxygen and steroids, increase activity.  Likely discharge in the next 1 to 2 days.  Encouraged the patient to sit up in chair in the daytime use I-S and flutter valve for pulmonary toiletry and then prone in bed when at night.   SpO2: 95 % O2 Flow Rate (L/min): 3 L/min  Recent Labs  Lab 04/29/19 1912 04/29/19 1933 04/29/19 2030 04/30/19 0530 05/01/19 0115  CRP  --   --  8.4* 8.4* 4.4*  DDIMER  --   --   --   --  3.09*  FERRITIN  --  136  --  148 126  BNP  --  25.0  --   --   --   PROCALCITON <0.10  --   --  <0.10  --     Hepatic Function Latest Ref Rng & Units  05/01/2019 04/30/2019 04/29/2019  Total Protein 6.5 - 8.1 g/dL 7.0 7.5 7.9  Albumin 3.5 - 5.0 g/dL 2.5(L) 2.6(L) 2.8(L)  AST 15 - 41 U/L '19 25 27  ' ALT 0 - 44 U/L '19 20 21  ' Alk Phosphatase 38 - 126 U/L 56 58 63  Total Bilirubin 0.3 - 1.2 mg/dL 0.4 0.6 0.8      2.  Dyslipidemia.  Home dose statin.  3.  Essential hypertension.  On Dyazide continue.  4.  Obesity with OSA.  BMI of 39, follow with PCP for weight loss, CPAP at night at home, will use use oxygen here.   5.  DM type II.  Continue linagliptin along with sliding scale, change diet to carb modified.  Lab Results  Component Value Date   HGBA1C 6.2 (H) 02/23/2019    CBG (last 3)  Recent Labs    04/30/19 2338 05/01/19 0418 05/01/19 0801  GLUCAP 132* 157* 128*     Condition - Stable  Family Communication  : Husband on 05/01/2019 by me   Code Status :  Full  Diet :   Diet Order            Diet regular Room service appropriate? Yes; Fluid consistency: Thin  Diet effective now               Disposition Plan  :  Home  Consults  :  None  Procedures  :    PUD Prophylaxis : PPI  DVT Prophylaxis  :  Lovenox added  Lab Results  Component Value Date   PLT 488 (H) 05/01/2019    Inpatient Medications  Scheduled Meds: . aspirin  81 mg Oral Daily  . atorvastatin  40 mg Oral Daily  . calcium carbonate  500 mg of elemental calcium Oral BID WC  . dexamethasone  4 mg Oral Q24H  . docusate sodium  100 mg Oral Daily  . influenza vaccine adjuvanted  0.5 mL Intramuscular Tomorrow-1000  . insulin aspart  0-15 Units Subcutaneous Q4H  . Ipratropium-Albuterol  1 puff Inhalation Q6H  . linagliptin  5 mg Oral Daily  . montelukast  10 mg Oral QHS  . pantoprazole  40 mg Oral Daily  . pneumococcal 23 valent vaccine  0.5 mL Intramuscular Tomorrow-1000  . triamterene-hydrochlorothiazide  1 capsule Oral Daily  . umeclidinium-vilanterol  1 puff Inhalation Daily  . vitamin B-12  100 mcg Oral Daily   Continuous  Infusions: . remdesivir 100 mg in NS 100 mL     PRN Meds:.acetaminophen, albuterol, chlorpheniramine-HYDROcodone, guaiFENesin-dextromethorphan, senna-docusate  Antibiotics  :    Anti-infectives (From admission, onward)   Start     Dose/Rate Route Frequency Ordered Stop   05/01/19 1000  remdesivir 100 mg in sodium chloride 0.9 % 100 mL IVPB     100 mg 200 mL/hr over 30 Minutes Intravenous Daily 04/30/19 1946 05/04/19 0959       Time Spent in minutes  30   Lala Lund M.D on 05/01/2019 at 10:14 AM  To page go to [email or web address] - password [facility]  [facility] -  Office  [phone]    See all Orders from today for further details    Objective:   Vitals:   05/01/19 0440 05/01/19 0600 05/01/19 0735 05/01/19 0801  BP:   123/76 118/75  Pulse:  (!) 56 62 60  Resp:  (!) 21 (!) 22 (!) 22  Temp: 97.9 F (36.6 C)  98.2 F (36.8 C)   TempSrc:   Oral   SpO2: 91% 96% 92% 95%  Weight:      Height:        Wt  Readings from Last 3 Encounters:  04/30/19 108 kg  04/29/19 106.6 kg  12/15/18 117.5 kg     Intake/Output Summary (Last 24 hours) at 05/01/2019 1014 Last data filed at 05/01/2019 0000 Gross per 24 hour  Intake 360 ml  Output 2 ml  Net 358 ml     Physical Exam  Awake Alert,   No new F.N deficits, Normal affect [place].AT,PERRAL Supple Neck,No JVD, No cervical lymphadenopathy appriciated.  Symmetrical Chest wall movement, Good air movement bilaterally, CTAB RRR,No Gallops,Rubs or new Murmurs, No Parasternal Heave +ve B.Sounds, Abd Soft, No tenderness, No organomegaly appriciated, No rebound - guarding or rigidity. No Cyanosis, Clubbing or edema, No new Rash or bruise      Data Review:    CBC Recent Labs  Lab 04/29/19 1351 04/30/19 0530 05/01/19 0115  WBC 9.4 8.0 11.6*  HGB 11.7* 11.2* 10.7*  HCT 38.3 37.5 35.8*  PLT 440* 454* 488*  MCV 79.5* 81.0 80.8  MCH 24.3* 24.2* 24.2*  MCHC 30.5 29.9* 29.9*  RDW 13.8 13.9 14.0  LYMPHSABS 0.7 0.6* 0.6*   MONOABS 0.2 0.1 0.4  EOSABS 0.6* 0.0 0.1  BASOSABS 0.0 0.0 0.0    Chemistries  Recent Labs  Lab 04/29/19 1351 04/30/19 0530 05/01/19 0115  NA 141 143 143  K 3.1* 3.8 3.5  CL 101 107 105  CO2 '27 25 26  ' GLUCOSE 100* 156* 130*  BUN '16 17 22  ' CREATININE 0.90 0.84 0.81  CALCIUM 8.8* 8.9 9.5  MG  --   --  2.0  AST '27 25 19  ' ALT '21 20 19  ' ALKPHOS 63 58 56  BILITOT 0.8 0.6 0.4   ------------------------------------------------------------------------------------------------------------------ Recent Labs    04/29/19 1933  TRIG 118    Lab Results  Component Value Date   HGBA1C 6.2 (H) 02/23/2019   ------------------------------------------------------------------------------------------------------------------ No results for input(s): TSH, T4TOTAL, T3FREE, THYROIDAB in the last 72 hours.  Invalid input(s): FREET3  Cardiac Enzymes No results for input(s): CKMB, TROPONINI, MYOGLOBIN in the last 168 hours.  Invalid input(s): CK ------------------------------------------------------------------------------------------------------------------    Component Value Date/Time   BNP 25.0 04/29/2019 1933    Micro Results Recent Results (from the past 240 hour(s))  Culture, blood (Routine X 2) w Reflex to ID Panel     Status: None (Preliminary result)   Collection Time: 04/29/19 10:01 PM   Specimen: BLOOD  Result Value Ref Range Status   Specimen Description BLOOD RIGHT ARM  Final   Special Requests   Final    BOTTLES DRAWN AEROBIC AND ANAEROBIC Blood Culture results may not be optimal due to an excessive volume of blood received in culture bottles   Culture   Final    NO GROWTH 2 DAYS Performed at [facility], [address] [place], [place] [address]    Report Status PENDING  Incomplete  Culture, blood (Routine X 2) w Reflex to ID Panel     Status: None (Preliminary result)   Collection Time: 04/29/19 10:01 PM   Specimen: BLOOD  Result Value Ref Range  Status   Specimen Description BLOOD RIGHT FOREARM  Final   Special Requests   Final    BOTTLES DRAWN AEROBIC AND ANAEROBIC Blood Culture adequate volume   Culture   Final    NO GROWTH 2 DAYS Performed at [facility], [address] [place], [place] [address]    Report Status PENDING  Incomplete  Culture, sputum-assessment     Status: None   Collection Time: 04/30/19  3:40 PM   Specimen: Urine, Clean Catch; Sputum  Result Value Ref Range Status   Specimen Description EXPECTORATED SPUTUM  Final   Special Requests NONE  Final   Sputum evaluation   Final    Sputum specimen not acceptable for testing.  Please recollect.   Performed at [facility], [address], [place], [place] [address]    Report Status 04/30/2019 FINAL  Final    Radiology Reports DG Chest 2 View  Result Date: 04/29/2019 CLINICAL DATA:  Cough and hypoxia EXAM: CHEST - 2 VIEW COMPARISON:  February 07, 2014 FINDINGS: There is multifocal airspace opacity with somewhat greater opacity in the right mid and lower lung zones compared to the left side where opacity is mainly in the left lower lung region. Heart size and pulmonary vascularity are normal. No adenopathy. There is mild degenerative change in the thoracic spine. IMPRESSION: Multifocal pneumonia, more notable in the right mid and lower lung zones than elsewhere. Cardiac silhouette within normal limits. No adenopathy. Electronically Signed   By: Lowella Grip III M.D.   On: 04/29/2019 14:35

## 2019-05-01 NOTE — Progress Notes (Signed)
PHARMACY CONSULT: Lovenox for VTE prophylaxis  Last Lovenox 50mg  (0.5 mg/kg) [place] q24h given on 1/16 at 2100  Wt: 108 kg BMI:  39.6 Scr:  0.81, CrCl >30 ml/hr  H/H: 10.7/35.8 Pltc: 488 D-dimer: 3.09  A/P:  Due to obesity, begin weight-adjusted lovenox 0.5mg /kg sq q24h  Pharmacy will sign off consult, but continue to monitor peripherally.   Gretta Arab PharmD, BCPS Clinical pharmacist phone 7am- 5pm: [phone] 05/01/2019 10:53 AM

## 2019-05-02 LAB — COMPREHENSIVE METABOLIC PANEL
ALT: 17 U/L (ref 0–44)
AST: 16 U/L (ref 15–41)
Albumin: 2.6 g/dL — ABNORMAL LOW (ref 3.5–5.0)
Alkaline Phosphatase: 53 U/L (ref 38–126)
Anion gap: 10 (ref 5–15)
BUN: 25 mg/dL — ABNORMAL HIGH (ref 8–23)
CO2: 25 mmol/L (ref 22–32)
Calcium: 9.4 mg/dL (ref 8.9–10.3)
Chloride: 108 mmol/L (ref 98–111)
Creatinine, Ser: 0.72 mg/dL (ref 0.44–1.00)
GFR calc Af Amer: >60 mL/min (ref >60)
GFR calc non Af Amer: >60 mL/min (ref >60)
Glucose, Bld: 141 mg/dL — ABNORMAL HIGH (ref 70–99)
Potassium: 4.3 mmol/L (ref 3.5–5.1)
Sodium: 143 mmol/L (ref 135–145)
Total Bilirubin: 0.6 mg/dL (ref 0.3–1.2)
Total Protein: 6.7 g/dL (ref 6.5–8.1)

## 2019-05-02 LAB — CBC WITH DIFFERENTIAL/PLATELET
Abs Immature Granulocytes: 0.3 10*3/uL — ABNORMAL HIGH (ref 0.00–0.07)
Basophils Absolute: 0 10*3/uL (ref 0.0–0.1)
Basophils Relative: 0 %
Eosinophils Absolute: 0 10*3/uL (ref 0.0–0.5)
Eosinophils Relative: 0 %
HCT: 36.6 % (ref 36.0–46.0)
Hemoglobin: 11 g/dL — ABNORMAL LOW (ref 12.0–15.0)
Immature Granulocytes: 3 %
Lymphocytes Relative: 6 %
Lymphs Abs: 0.6 10*3/uL — ABNORMAL LOW (ref 0.7–4.0)
MCH: 24.2 pg — ABNORMAL LOW (ref 26.0–34.0)
MCHC: 30.1 g/dL (ref 30.0–36.0)
MCV: 80.4 fL (ref 80.0–100.0)
Monocytes Absolute: 0.4 10*3/uL (ref 0.1–1.0)
Monocytes Relative: 4 %
Neutro Abs: 8.1 10*3/uL — ABNORMAL HIGH (ref 1.7–7.7)
Neutrophils Relative %: 87 %
Platelets: 557 10*3/uL — ABNORMAL HIGH (ref 150–400)
RBC: 4.55 MIL/uL (ref 3.87–5.11)
RDW: 14.1 % (ref 11.5–15.5)
WBC: 9.4 10*3/uL (ref 4.0–10.5)
nRBC: 0.2 % (ref 0.0–0.2)

## 2019-05-02 LAB — D-DIMER, QUANTITATIVE: D-Dimer, Quant: 2.02 ug/mL-FEU — ABNORMAL HIGH (ref 0.00–0.50)

## 2019-05-02 LAB — GLUCOSE, CAPILLARY
Glucose-Capillary: 110 mg/dL — ABNORMAL HIGH (ref 70–99)
Glucose-Capillary: 115 mg/dL — ABNORMAL HIGH (ref 70–99)
Glucose-Capillary: 115 mg/dL — ABNORMAL HIGH (ref 70–99)
Glucose-Capillary: 124 mg/dL — ABNORMAL HIGH (ref 70–99)
Glucose-Capillary: 138 mg/dL — ABNORMAL HIGH (ref 70–99)

## 2019-05-02 LAB — BRAIN NATRIURETIC PEPTIDE: B Natriuretic Peptide: 55.8 pg/mL (ref 0.0–100.0)

## 2019-05-02 LAB — MAGNESIUM: Magnesium: 2.1 mg/dL (ref 1.7–2.4)

## 2019-05-02 LAB — C-REACTIVE PROTEIN: CRP: 1.3 mg/dL — ABNORMAL HIGH (ref <1.0)

## 2019-05-02 MED ORDER — DEXAMETHASONE 2 MG PO TABS
2.0000 mg | ORAL_TABLET | ORAL | Status: DC
  Administered 2019-05-02: 2 mg via ORAL
  Filled 2019-05-02 (×2): qty 1

## 2019-05-02 NOTE — Progress Notes (Signed)
PROGRESS NOTE                                                                                                                                                                                                             Patient Demographics:    Alexandra Foster, is a 69 y.o. female, DOB - 07/01/1950, [id number]  Outpatient Primary MD for the patient is Steele Sizer, MD    LOS - 2  Admit date - 04/30/2019    CC - SOB     Brief Narrative  Alexandra Foster is a 69 y.o. female with medical history significant of Asthma/COPD, T2DM, HTN, OSA on CPAP initially admitted to [facility] with hypoxemic respiratory failure and COVID-19 pneumonia presenting in transfer to [facility] on 04/30/2019.  She has been treated so far with IV steroids and remdesivir.   Subjective:   Patient in bed, appears comfortable, denies any headache, no fever, no chest pain or pressure, no shortness of breath , no abdominal pain. No focal weakness.   Assessment  & Plan :     1. Acute Hypoxic Resp. Failure due to Acute Covid 19 Viral Pneumonitis during the ongoing 2020 Covid 19 Pandemic - she mild to moderate disease has been treated with IV steroids and remdesivir with good improvement, down to1L nasal cannula oxygen and relatively symptom-free except for cough, will continue to titrate down oxygen and steroids, increase activity.  Likely discharge in am.  Encouraged the patient to sit up in chair in the daytime use I-S and flutter valve for pulmonary toiletry and then prone in bed when at night.   Recent Labs  Lab 04/29/19 1912 04/29/19 1933 04/29/19 2030 04/30/19 0530 05/01/19 0115 05/02/19 0540  CRP  --   --  8.4* 8.4* 4.4* 1.3*  DDIMER  --   --   --   --  3.09* 2.02*  FERRITIN  --  136  --  148 126  --   BNP  --  25.0  --   --   --  55.8  PROCALCITON <0.10  --   --  <0.10  --   --     Hepatic Function Latest Ref Rng & Units 05/02/2019 05/01/2019 04/30/2019  Total  Protein 6.5 - 8.1 g/dL 6.7 7.0 7.5  Albumin 3.5 - 5.0 g/dL 2.6(L) 2.5(L) 2.6(L)  AST 15 - 41 U/L _0 ALT  0 - 44 U/L _0 Alk Phosphatase 38 - 126 U/L 53 56 58  Total Bilirubin 0.3 - 1.2 mg/dL 0.6 0.4 0.6      2.  Dyslipidemia.  Home dose statin.  3.  Essential hypertension.  On Dyazide continue.  4.  Obesity with OSA.  BMI of 39, follow with PCP for weight loss, CPAP at night at home, will use use oxygen here.   5.  DM type II.  Continue linagliptin along with sliding scale, change diet to carb modified.  Lab Results  Component Value Date   HGBA1C 6.2 (H) 02/23/2019    CBG (last 3)  Recent Labs    05/01/19 1926 05/02/19 0014 05/02/19 0454  GLUCAP 98 115* 138*     Condition - Stable  Family Communication  : Husband on 05/01/2019 by me   Code Status :  Full  Diet :   Diet Order            Diet Carb Modified Fluid consistency: Thin; Room service appropriate? Yes  Diet effective now               Disposition Plan  :  Home  Consults  :  None  Procedures  :    PUD Prophylaxis : PPI  DVT Prophylaxis  :  Lovenox added  Lab Results  Component Value Date   PLT 557 (H) 05/02/2019    Inpatient Medications  Scheduled Meds: . aspirin  81 mg Oral Daily  . atorvastatin  40 mg Oral Daily  . calcium carbonate  500 mg of elemental calcium Oral BID WC  . dexamethasone  4 mg Oral Q24H  . docusate sodium  100 mg Oral Daily  . enoxaparin (LOVENOX) injection  0.5 mg/kg Subcutaneous Q24H  . influenza vaccine adjuvanted  0.5 mL Intramuscular Tomorrow-1000  . insulin aspart  0-15 Units Subcutaneous Q4H  . Ipratropium-Albuterol  1 puff Inhalation Q6H  . linagliptin  5 mg Oral Daily  . montelukast  10 mg Oral QHS  . pantoprazole  40 mg Oral Daily  . pneumococcal 23 valent vaccine  0.5 mL Intramuscular Tomorrow-1000  . triamterene-hydrochlorothiazide  1 capsule Oral Daily  . umeclidinium-vilanterol  1 puff Inhalation Daily  . vitamin B-12  100 mcg Oral  Daily   Continuous Infusions: . remdesivir 100 mg in NS 100 mL 100 mg (05/02/19 0923)   PRN Meds:.acetaminophen, albuterol, chlorpheniramine-HYDROcodone, guaiFENesin-dextromethorphan, senna-docusate  Antibiotics  :    Anti-infectives (From admission, onward)   Start     Dose/Rate Route Frequency Ordered Stop   05/01/19 1000  remdesivir 100 mg in sodium chloride 0.9 % 100 mL IVPB     100 mg 200 mL/hr over 30 Minutes Intravenous Daily 04/30/19 1946 05/04/19 0959       Time Spent in minutes  30   Lala Lund M.D on 05/02/2019 at 10:56 AM  To page go to [email or web address] - password [facility]  [facility] -  Office  [phone]    See all Orders from today for further details    Objective:   Vitals:   05/01/19 1800 05/01/19 1933 05/02/19 0318 05/02/19 0740  BP: (!) 120/56 114/60 (!) 126/55 113/68  Pulse: 67 74 71   Resp: _1 Temp: 98.2 F (36.8 C) 98.9 F (37.2 C) 98.6 F (37 C) 98.5 F (36.9 C)  TempSrc: Oral Oral Oral Oral  SpO2: 94% 90% 95% 93%  Weight:      Height:  Wt Readings from Last 3 Encounters:  04/30/19 108 kg  04/29/19 106.6 kg  12/15/18 117.5 kg     Intake/Output Summary (Last 24 hours) at 05/02/2019 1056 Last data filed at 05/01/2019 1933 Gross per 24 hour  Intake 200 ml  Output --  Net 200 ml     Physical Exam  Awake Alert,   No new F.N deficits, Normal affect [place].AT,PERRAL Supple Neck,No JVD, No cervical lymphadenopathy appriciated.  Symmetrical Chest wall movement, Good air movement bilaterally, CTAB RRR,No Gallops, Rubs or new Murmurs, No Parasternal Heave +ve B.Sounds, Abd Soft, No tenderness, No organomegaly appriciated, No rebound - guarding or rigidity. No Cyanosis, Clubbing or edema, No new Rash or bruise    Data Review:    CBC Recent Labs  Lab 04/29/19 1351 04/30/19 0530 05/01/19 0115 05/02/19 0540  WBC 9.4 8.0 11.6* 9.4  HGB 11.7* 11.2* 10.7* 11.0*  HCT 38.3 37.5 35.8* 36.6  PLT 440* 454* 488*  557*  MCV 79.5* 81.0 80.8 80.4  MCH 24.3* 24.2* 24.2* 24.2*  MCHC 30.5 29.9* 29.9* 30.1  RDW 13.8 13.9 14.0 14.1  LYMPHSABS 0.7 0.6* 0.6* 0.6*  MONOABS 0.2 0.1 0.4 0.4  EOSABS 0.6* 0.0 0.1 0.0  BASOSABS 0.0 0.0 0.0 0.0    Chemistries  Recent Labs  Lab 04/29/19 1351 04/30/19 0530 05/01/19 0115 05/02/19 0540  NA 141 143 143 143  K 3.1* 3.8 3.5 4.3  CL 101 107 105 108  CO2 _0 GLUCOSE 100* 156* 130* 141*  BUN _1 25*  CREATININE 0.90 0.84 0.81 0.72  CALCIUM 8.8* 8.9 9.5 9.4  MG  --   --  2.0 2.1  AST _2 ALT _3 ALKPHOS 63 58 56 53  BILITOT 0.8 0.6 0.4 0.6   ------------------------------------------------------------------------------------------------------------------ Recent Labs    04/29/19 1933  TRIG 118    Lab Results  Component Value Date   HGBA1C 6.2 (H) 02/23/2019   ------------------------------------------------------------------------------------------------------------------ No results for input(s): TSH, T4TOTAL, T3FREE, THYROIDAB in the last 72 hours.  Invalid input(s): FREET3  Cardiac Enzymes No results for input(s): CKMB, TROPONINI, MYOGLOBIN in the last 168 hours.  Invalid input(s): CK ------------------------------------------------------------------------------------------------------------------    Component Value Date/Time   BNP 55.8 05/02/2019 0540    Micro Results Recent Results (from the past 240 hour(s))  Culture, blood (Routine X 2) w Reflex to ID Panel     Status: None (Preliminary result)   Collection Time: 04/29/19 10:01 PM   Specimen: BLOOD  Result Value Ref Range Status   Specimen Description BLOOD RIGHT ARM  Final   Special Requests   Final    BOTTLES DRAWN AEROBIC AND ANAEROBIC Blood Culture results may not be optimal due to an excessive volume of blood received in culture bottles   Culture   Final    NO GROWTH 3 DAYS Performed at [facility], [address], [place] [address]    Report Status PENDING  Incomplete  Culture, blood (Routine X 2) w Reflex to ID Panel     Status: None (Preliminary result)   Collection Time: 04/29/19 10:01 PM   Specimen: BLOOD  Result Value Ref Range Status   Specimen Description BLOOD RIGHT FOREARM  Final   Special Requests   Final    BOTTLES DRAWN AEROBIC AND ANAEROBIC Blood Culture adequate volume   Culture   Final    NO GROWTH 3 DAYS Performed at [facility], [address] [place], [place] [address]    Report Status PENDING  Incomplete  Culture, sputum-assessment     Status: None   Collection Time: 04/30/19  3:40 PM   Specimen: Urine, Clean Catch; Sputum  Result Value Ref Range Status   Specimen Description EXPECTORATED SPUTUM  Final   Special Requests NONE  Final   Sputum evaluation   Final    Sputum specimen not acceptable for testing.  Please recollect.   Performed at [facility], [address], [place], [place] [address]    Report Status 04/30/2019 FINAL  Final    Radiology Reports DG Chest 2 View  Result Date: 04/29/2019 CLINICAL DATA:  Cough and hypoxia EXAM: CHEST - 2 VIEW COMPARISON:  February 07, 2014 FINDINGS: There is multifocal airspace opacity with somewhat greater opacity in the right mid and lower lung zones compared to the left side where opacity is mainly in the left lower lung region. Heart size and pulmonary vascularity are normal. No adenopathy. There is mild degenerative change in the thoracic spine. IMPRESSION: Multifocal pneumonia, more notable in the right mid and lower lung zones than elsewhere. Cardiac silhouette within normal limits. No adenopathy. Electronically Signed   By: Lowella Grip III M.D.   On: 04/29/2019 14:35

## 2019-05-02 NOTE — Progress Notes (Signed)
Occupational Therapy Evaluation Patient Details Name: Alexandra Foster MRN: [id number] DOB: 1951-03-10 Today's Date: 05/02/2019    History of Present Illness 69 year old female admitted with PNEUMONIA secondary to [place]   PMH includes COPD, Asthma and OSA with CPAP. Transfer from [facility].   Clinical Impression   Patient very determined to work hard.  She lives at home with husband and is independent at prior level. She required supervision with functional mobility and was independent with upper body ADLs.  She did steady herself on surfaces when resting while standing.  Could not get a consistent pleth on SpO2 but at rest she was about 94 and with mobility she was about 89 on room air.  She was not short of breath or lightheaded with mobility.  Will follow acutely with OT to address the deficits listed below to ensure a safe discharge home.     Follow Up Recommendations  No OT follow up    Equipment Recommendations  3 in 1 bedside commode    Recommendations for Other Services       Precautions / Restrictions Precautions Precautions: None Precaution Comments: try RA , use ear Restrictions Weight Bearing Restrictions: No      Mobility Bed Mobility Overal bed mobility: Independent                Transfers Overall transfer level: Needs assistance Equipment used: None Transfers: Sit to/from Stand Sit to Stand: Supervision             Balance Overall balance assessment: Mild deficits observed, not formally tested                                         ADL either performed or assessed with clinical judgement   ADL Overall ADL's : Needs assistance/impaired Eating/Feeding: Independent   Grooming: Standing;Supervision/safety   Upper Body Bathing: Sitting;Modified independent   Lower Body Bathing: Sitting/lateral leans;Supervison/ safety   Upper Body Dressing : Independent;Sitting   Lower Body Dressing: Supervision/safety;Sit to/from stand   Toilet Transfer: Supervision/safety   Toileting- Water quality scientist and Hygiene: Supervision/safety       Functional mobility during ADLs: Supervision/safety       Vision         Perception     Praxis      Pertinent Vitals/Pain Pain Assessment: No/denies pain     Hand Dominance     Extremity/Trunk Assessment Upper Extremity Assessment Upper Extremity Assessment: Generalized weakness   Lower Extremity Assessment Lower Extremity Assessment: Generalized weakness   Cervical / Trunk Assessment Cervical / Trunk Assessment: Normal   Communication Communication Communication: No difficulties   Cognition Arousal/Alertness: Awake/alert Behavior During Therapy: WFL for tasks assessed/performed Overall Cognitive Status: Within Functional Limits for tasks assessed                                     General Comments      Exercises Exercises: Other exercises;General Upper Extremity General Exercises - Upper Extremity Shoulder Flexion: AROM;10 reps;Theraband Theraband Level (Shoulder Flexion): Level 1 (Yellow) Shoulder ABduction: AROM;15 reps;Theraband Theraband Level (Shoulder Abduction): Level 1 (Yellow) Elbow Flexion: AROM;10 reps;Theraband Theraband Level (Elbow Flexion): Level 1 (Yellow) Elbow Extension: AROM;10 reps;Theraband Theraband Level (Elbow Extension): Level 1 (Yellow) Other Exercises Other Exercises: x10 Incentive spirometer Other Exercises: x10 flutter valve   Shoulder  Instructions      Home Living Family/patient expects to be discharged to:: Private residence Living Arrangements: Spouse/significant other Available Help at Discharge: Available PRN/intermittently Type of Home: House Home Access: Stairs to enter [employer] of Steps: 4 Entrance Stairs-Rails: None Home Layout: One level     Bathroom Shower/Tub: Occupational psychologist: Standard     Home Equipment: None          Prior  Functioning/Environment Level of Independence: Independent                 OT Problem List: Decreased strength;Decreased activity tolerance;Impaired balance (sitting and/or standing);Cardiopulmonary status limiting activity      OT Treatment/Interventions: Self-care/ADL training;Therapeutic exercise;Energy conservation;Therapeutic activities;Balance training    OT Goals(Current goals can be found in the care plan section) Acute Rehab OT Goals Patient Stated Goal: To get home OT Goal Formulation: With patient Time For Goal Achievement: 05/16/19 Potential to Achieve Goals: Good  OT Frequency: Min 2X/week   Barriers to D/C:            Co-evaluation              AM-PAC OT "6 Clicks" Daily Activity     Outcome Measure Help from another person eating meals?: None Help from another person taking care of personal grooming?: A Little Help from another person toileting, which includes using toliet, bedpan, or urinal?: A Little Help from another person bathing (including washing, rinsing, drying)?: A Little Help from another person to put on and taking off regular upper body clothing?: None Help from another person to put on and taking off regular lower body clothing?: A Little 6 Click Score: 20   End of Session Nurse Communication: Mobility status  Activity Tolerance: Patient tolerated treatment well Patient left: in chair;with call bell/phone within reach  OT Visit Diagnosis: Unsteadiness on feet (R26.81);Muscle weakness (generalized) (M62.81)                Time: 3419-6222 OT Time Calculation (min): 26 min Charges:  OT General Charges $OT Visit: 1 Visit OT Evaluation $OT Eval Moderate Complexity: 1 Mod OT Treatments $Therapeutic Activity: 8-22 mins  August Luz, OTR/L   Alexandra Foster 05/02/2019, 11:03 AM

## 2019-05-02 NOTE — Plan of Care (Signed)
  Problem: Education: Goal: Knowledge of risk factors and measures for prevention of condition will improve Outcome: Progressing   Problem: Coping: Goal: Psychosocial and spiritual needs will be supported Outcome: Progressing   Problem: Respiratory: Goal: Will maintain a patent airway Outcome: Progressing Goal: Complications related to the disease process, condition or treatment will be avoided or minimized Outcome: Progressing   

## 2019-05-02 NOTE — Evaluation (Addendum)
Physical Therapy Evaluation Patient Details Name: Alexandra Foster MRN: [id number] DOB: [birth year] Today's Date: 05/02/2019   History of Present Illness  69 year old female admitted with PNEUMONIA secondary to [place]   PMH includes COPD, Asthma and OSA with CPAP. Transfer from [facility].  Clinical Impression  The patient is eager to mobilize and ambulate. SPO2(finger) 96% resting. Intermittent readings while ambulating so placed on ear when returned to room with SPO2 92%. Provided HEP and  Instructions, Encouraged IS and flutter and reviewed their use. Patient verbalizes and demonstrates. Patient  Should progress to return home. Pt admitted with above diagnosis.   Pt currently with functional limitations due to the deficits listed below (see PT Problem List). Pt will benefit from skilled PT to increase their independence and safety with mobility to allow discharge to the venue listed below.       Follow Up Recommendations No PT follow up    Equipment Recommendations  Patient requests a toilet riser   Recommendations for Other Services       Precautions / Restrictions Precautions Precaution Comments: try RA , use ear      Mobility  Bed Mobility Overal bed mobility: Independent                Transfers Overall transfer level: Needs assistance Equipment used: None Transfers: Sit to/from Stand Sit to Stand: Supervision         General transfer comment: ad lib in BR ,  Ambulation/Gait Ambulation/Gait assistance: Min guard Gait Distance (Feet): 220 Feet Assistive device: None Gait Pattern/deviations: Step-through pattern Gait velocity: decr Gait velocity interpretation: <1.31 ft/sec, indicative of household ambulator General Gait Details: lateral  weight shifts when ambulating, slow speed  Stairs            Wheelchair Mobility    Modified Rankin (Stroke Patients Only)       Balance Overall balance assessment: No apparent balance deficits (not formally  assessed)                                           Pertinent Vitals/Pain      Home Living Family/patient expects to be discharged to:: Private residence Living Arrangements: Spouse/significant other Available Help at Discharge: Available PRN/intermittently Type of Home: House Home Access: Stairs to enter     Home Layout: One level Home Equipment: None      Prior Function Level of Independence: Independent               Hand Dominance        Extremity/Trunk Assessment        Lower Extremity Assessment Lower Extremity Assessment: Generalized weakness    Cervical / Trunk Assessment Cervical / Trunk Assessment: Normal  Communication      Cognition Arousal/Alertness: Awake/alert Behavior During Therapy: WFL for tasks assessed/performed Overall Cognitive Status: Within Functional Limits for tasks assessed                                        General Comments General comments (skin integrity, edema, etc.): propped on counter to brush teeth.    Exercises     Assessment/Plan    PT Assessment Patient needs continued PT services  PT Problem List Decreased strength;Decreased mobility;Decreased safety awareness;Decreased knowledge of precautions;Decreased activity tolerance;Decreased balance;Cardiopulmonary status  limiting activity       PT Treatment Interventions DME instruction;Gait training;Functional mobility training;Patient/family education;Therapeutic exercise;Therapeutic activities    PT Goals (Current goals can be found in the Care Plan section)  Acute Rehab PT Goals Patient Stated Goal: to get moving PT Goal Formulation: With patient Time For Goal Achievement: 05/16/19 Potential to Achieve Goals: Good    Frequency Min 3X/week   Barriers to discharge   spouse works but poatient should do well in her home.    Co-evaluation               AM-PAC PT "6 Clicks" Mobility  Outcome Measure Help needed  turning from your back to your side while in a flat bed without using bedrails?: None Help needed moving from lying on your back to sitting on the side of a flat bed without using bedrails?: None Help needed moving to and from a bed to a chair (including a wheelchair)?: None Help needed standing up from a chair using your arms (e.g., wheelchair or bedside chair)?: None Help needed to walk in hospital room?: A Little Help needed climbing 3-5 steps with a railing? : A Little 6 Click Score: 22    End of Session Equipment Utilized During Treatment: Oxygen(did not use O2) Activity Tolerance: Patient tolerated treatment well Patient left: in chair;with call bell/phone within reach Nurse Communication: Mobility status PT Visit Diagnosis: Unsteadiness on feet (R26.81);Difficulty in walking, not elsewhere classified (R26.2)    Time: 3790-2409 PT Time Calculation (min) (ACUTE ONLY): 47 min   Charges:   PT Evaluation $PT Eval Moderate Complexity: 1 Mod PT Treatments $Gait Training: 8-22 mins $Self Care/Home Management: [place] Pager [phone] Office [phone]   Claretha Cooper 05/02/2019, 10:08 AM

## 2019-05-02 NOTE — Progress Notes (Signed)
   05/02/19 1258  Family/Significant Other Communication  Family/Significant Other Update Called;Updated (Husband [place])

## 2019-05-03 DIAGNOSIS — Z23 Encounter for immunization: Secondary | ICD-10-CM | POA: Diagnosis not present

## 2019-05-03 LAB — CBC WITH DIFFERENTIAL/PLATELET
Abs Immature Granulocytes: 0.4 10*3/uL — ABNORMAL HIGH (ref 0.00–0.07)
Basophils Absolute: 0.1 10*3/uL (ref 0.0–0.1)
Basophils Relative: 1 %
Eosinophils Absolute: 0 10*3/uL (ref 0.0–0.5)
Eosinophils Relative: 0 %
HCT: 36.2 % (ref 36.0–46.0)
Hemoglobin: 10.9 g/dL — ABNORMAL LOW (ref 12.0–15.0)
Immature Granulocytes: 5 %
Lymphocytes Relative: 11 %
Lymphs Abs: 0.9 10*3/uL (ref 0.7–4.0)
MCH: 24.1 pg — ABNORMAL LOW (ref 26.0–34.0)
MCHC: 30.1 g/dL (ref 30.0–36.0)
MCV: 80.1 fL (ref 80.0–100.0)
Monocytes Absolute: 0.5 10*3/uL (ref 0.1–1.0)
Monocytes Relative: 6 %
Neutro Abs: 7 10*3/uL (ref 1.7–7.7)
Neutrophils Relative %: 77 %
Platelets: 583 10*3/uL — ABNORMAL HIGH (ref 150–400)
RBC: 4.52 MIL/uL (ref 3.87–5.11)
RDW: 14.1 % (ref 11.5–15.5)
WBC: 8.9 10*3/uL (ref 4.0–10.5)
nRBC: 0.2 % (ref 0.0–0.2)

## 2019-05-03 LAB — COMPREHENSIVE METABOLIC PANEL
ALT: 17 U/L (ref 0–44)
AST: 18 U/L (ref 15–41)
Albumin: 2.4 g/dL — ABNORMAL LOW (ref 3.5–5.0)
Alkaline Phosphatase: 50 U/L (ref 38–126)
Anion gap: 8 (ref 5–15)
BUN: 25 mg/dL — ABNORMAL HIGH (ref 8–23)
CO2: 27 mmol/L (ref 22–32)
Calcium: 9.7 mg/dL (ref 8.9–10.3)
Chloride: 107 mmol/L (ref 98–111)
Creatinine, Ser: 0.74 mg/dL (ref 0.44–1.00)
GFR calc Af Amer: >60 mL/min (ref >60)
GFR calc non Af Amer: >60 mL/min (ref >60)
Glucose, Bld: 125 mg/dL — ABNORMAL HIGH (ref 70–99)
Potassium: 3.8 mmol/L (ref 3.5–5.1)
Sodium: 142 mmol/L (ref 135–145)
Total Bilirubin: 0.3 mg/dL (ref 0.3–1.2)
Total Protein: 6.2 g/dL — ABNORMAL LOW (ref 6.5–8.1)

## 2019-05-03 LAB — BRAIN NATRIURETIC PEPTIDE: B Natriuretic Peptide: 66.7 pg/mL (ref 0.0–100.0)

## 2019-05-03 LAB — D-DIMER, QUANTITATIVE: D-Dimer, Quant: 2 ug/mL-FEU — ABNORMAL HIGH (ref 0.00–0.50)

## 2019-05-03 LAB — GLUCOSE, CAPILLARY
Glucose-Capillary: 121 mg/dL — ABNORMAL HIGH (ref 70–99)
Glucose-Capillary: 144 mg/dL — ABNORMAL HIGH (ref 70–99)
Glucose-Capillary: 144 mg/dL — ABNORMAL HIGH (ref 70–99)
Glucose-Capillary: 92 mg/dL (ref 70–99)
Glucose-Capillary: 93 mg/dL (ref 70–99)

## 2019-05-03 LAB — C-REACTIVE PROTEIN: CRP: 0.9 mg/dL (ref <1.0)

## 2019-05-03 LAB — MAGNESIUM: Magnesium: 2 mg/dL (ref 1.7–2.4)

## 2019-05-03 NOTE — Progress Notes (Signed)
   05/03/19 1114  Family/Significant Other Communication  Family/Significant Other Update Called;Updated (Husband [place])

## 2019-05-03 NOTE — Progress Notes (Signed)
Patient scheduled for outpatient Remdesivir infusion at 0830AM on Tuesday 1/20  Please advise them to report to [facility] at [address].  Drive to the security guard and tell them you are here for an infusion. They will direct you to the front entrance where we will come and get you.  For questions call [phone].  Thanks

## 2019-05-03 NOTE — Discharge Summary (Signed)
Alexandra Foster PIR:[id number] DOB: 07-07-1950 DOA: 04/30/2019  PCP: Steele Sizer, MD  Admit date: 04/30/2019  Discharge date: 05/03/2019  Admitted From: [facility]   Disposition:  Home   Recommendations for Outpatient Follow-up:   Follow up with PCP in 1-2 weeks  PCP Please obtain BMP/CBC, 2 view CXR in 1week,  (see Discharge instructions)   PCP Please follow up on the following pending results:    Home Health: None   Equipment/Devices: None  Consultations: None  Discharge Condition: Stable    CODE STATUS: Full    Diet Recommendation: Heart Healthy Low Carb  CC - SOB   Brief history of present illness from the day of admission and additional interim summary    Alexandra Foster a 69 y.o.femalewith medical history significant ofAsthma/COPD, T2DM, HTN, OSA on CPAP initially admitted to [facility] with hypoxemic respiratory failure and COVID-19 pneumonia presenting in transfer to [facility] on 04/30/2019.  She has been treated so far with IV steroids and remdesivir.                                                                 Hospital Course   1. Acute Hypoxic Resp. Failure due to Acute Covid 19 Viral Pneumonitis during the ongoing 2020 Covid 19 Pandemic - she had mild to moderate disease was treated with IV steroids and remdesivir with good improvement, down to RA and symptom-free except for mild cough, will DC home with outpt last Remdesivir dose and PCP follow up in 1 week.   Recent Labs  Lab 04/29/19 1912 04/29/19 1933 04/29/19 2030 04/30/19 0530 05/01/19 0115 05/02/19 0540 05/03/19 0150  CRP  --   --  8.4* 8.4* 4.4* 1.3* 0.9  DDIMER  --   --   --   --  3.09* 2.02* 2.00*  FERRITIN  --  136  --  148 126  --   --   BNP  --  25.0  --   --   --  55.8 66.7  PROCALCITON <0.10  --   --  <0.10  --   --   --      Hepatic Function Latest Ref Rng & Units 05/03/2019 05/02/2019 05/01/2019  Total Protein 6.5 - 8.1 g/dL 6.2(L) 6.7 7.0  Albumin 3.5 - 5.0 g/dL 2.4(L) 2.6(L) 2.5(L)  AST 15 - 41 U/L _0 ALT 0 - 44 U/L _1 Alk Phosphatase 38 - 126 U/L 50 53 56  Total Bilirubin 0.3 - 1.2 mg/dL 0.3 0.6 0.4    2.  Dyslipidemia.  Home dose statin.  3.  Essential hypertension.  On Dyazide continue.  4.  Obesity with OSA.  BMI of 39, follow with PCP for weight loss, CPAP at night at home.   5.  DM type II.  Home Rx and carb modified.   Discharge diagnosis     Active Problems:   Pneumonia due to COVID-19 virus    Discharge instructions    Discharge Instructions    Discharge instructions   Complete by: As directed    You are scheduled for an outpatient infusion of Remdesivir at  0830AM on Tuesday 1/20 .  Please [facility] to [name] at [address].  Drive to the security guard and tell them you are here for an infusion. They will direct you to the front entrance where we will come and get you.  For questions call [phone].     Follow with Primary MD Steele Sizer, MD in 7 days   Get CBC, CMP, 2 view Chest X ray -  checked next visit within 1 week by Primary MD    Activity: As tolerated with Full fall precautions use walker/cane & assistance as needed  Disposition Home    Diet: Heart Healthy Low Carb.  Special Instructions: If you have smoked or chewed Tobacco  in the last 2 yrs please stop smoking, stop any regular Alcohol  and or any Recreational drug use.  On your next visit with your primary care physician please Get Medicines reviewed and adjusted.  Please request your Prim.MD to go over all Hospital Tests and Procedure/Radiological results at the follow up, please get all Hospital records sent to your Prim MD by signing hospital release before you go home.  If you experience worsening of your admission symptoms, develop shortness of breath, life  threatening emergency, suicidal or homicidal thoughts you must seek medical attention immediately by calling 911 or calling your MD immediately  if symptoms less severe.  You Must read complete instructions/literature along with all the possible adverse reactions/side effects for all the Medicines you take and that have been prescribed to you. Take any new Medicines after you have completely understood and accpet all the possible adverse reactions/side effects.   Increase activity slowly   Complete by: As directed    MyChart COVID-19 home monitoring program   Complete by: May 03, 2019    Is the patient willing to use the [place] for home monitoring?: Yes   Temperature monitoring   Complete by: May 03, 2019    After how many days would you like to receive a notification of this patient's flowsheet entries?: 1      Discharge Medications   Allergies as of 05/03/2019   No Known Allergies     Medication List    TAKE these medications   Accu-Chek Aviva Plus w/Device Kit USE AS DIRECTED   Accu-Chek Aviva Soln 1 each by In Vitro route daily.   accu-chek soft touch lancets Use as instructed   albuterol 108 (90 Base) MCG/ACT inhaler Commonly known as: VENTOLIN HFA Inhale 1-2 puffs into the lungs every 4 (four) hours as needed for wheezing or shortness of breath.   albuterol (2.5 MG/3ML) 0.083% nebulizer solution Commonly known as: PROVENTIL Take 3 mLs (2.5 mg total) by nebulization every 6 (six) hours as needed for wheezing or shortness of breath.   aspirin 81 MG tablet Take 1 tablet (81 mg total) by mouth daily.   atorvastatin 40 MG tablet Commonly known as: LIPITOR TAKE 1 TABLET EVERY DAY   calcium carbonate 1500 (600 Ca) MG Tabs tablet Commonly known as: OSCAL Take 600 mg of elemental calcium by mouth 2 (two) times daily with a meal.   docusate sodium 100 MG  capsule Commonly known as: COLACE Take 100 mg by mouth daily.   glucose blood test strip Commonly  known as: Accu-Chek Aviva Plus Use as instructed   metFORMIN 750 MG 24 hr tablet Commonly known as: GLUCOPHAGE-XR TAKE 1 TABLET DAILY WITH BREAKFAST.   montelukast 10 MG tablet Commonly known as: SINGULAIR Take 10 mg by mouth at bedtime.   MULTIVITAL PO Take 1 tablet by mouth daily.   omeprazole 20 MG capsule Commonly known as: PRILOSEC TAKE 1 CAPSULE EVERY DAY   Promethazine-Codeine 6.25-10 MG/5ML Soln Take 5 mLs by mouth 2 (two) times daily.   triamterene-hydrochlorothiazide 37.5-25 MG capsule Commonly known as: DYAZIDE TAKE 1 CAPSULE EVERY DAY   umeclidinium-vilanterol 62.5-25 MCG/INH Aepb Commonly known as: Anoro Ellipta Inhale 1 puff into the lungs daily.   vitamin B-12 100 MCG tablet Commonly known as: CYANOCOBALAMIN Take 100 mcg by mouth daily.       Follow-up Information    Steele Sizer, MD. Schedule an appointment as soon as possible for a visit in 1 week(s).   Specialty: Family Medicine Contact information: [address] [place] [address] [phone]           Major procedures and Radiology Reports - PLEASE review detailed and final reports thoroughly  -        DG Chest 2 View  Result Date: 04/29/2019 CLINICAL DATA:  Cough and hypoxia EXAM: CHEST - 2 VIEW COMPARISON:  February 07, 2014 FINDINGS: There is multifocal airspace opacity with somewhat greater opacity in the right mid and lower lung zones compared to the left side where opacity is mainly in the left lower lung region. Heart size and pulmonary vascularity are normal. No adenopathy. There is mild degenerative change in the thoracic spine. IMPRESSION: Multifocal pneumonia, more notable in the right mid and lower lung zones than elsewhere. Cardiac silhouette within normal limits. No adenopathy. Electronically Signed   By: Lowella Grip III M.D.   On: 04/29/2019 14:35    Micro Results     Recent Results (from the past 240 hour(s))  Culture, blood (Routine X 2) w  Reflex to ID Panel     Status: None (Preliminary result)   Collection Time: 04/29/19 10:01 PM   Specimen: BLOOD  Result Value Ref Range Status   Specimen Description BLOOD RIGHT ARM  Final   Special Requests   Final    BOTTLES DRAWN AEROBIC AND ANAEROBIC Blood Culture results may not be optimal due to an excessive volume of blood received in culture bottles   Culture   Final    NO GROWTH 4 DAYS Performed at [facility], [address], [place], [place] [address]    Report Status PENDING  Incomplete  Culture, blood (Routine X 2) w Reflex to ID Panel     Status: None (Preliminary result)   Collection Time: 04/29/19 10:01 PM   Specimen: BLOOD  Result Value Ref Range Status   Specimen Description BLOOD RIGHT FOREARM  Final   Special Requests   Final    BOTTLES DRAWN AEROBIC AND ANAEROBIC Blood Culture adequate volume   Culture   Final    NO GROWTH 4 DAYS Performed at [facility], [address] [place], [place] [address]    Report Status PENDING  Incomplete  Culture, sputum-assessment     Status: None   Collection Time: 04/30/19  3:40 PM   Specimen: Urine, Clean Catch; Sputum  Result Value Ref Range Status   Specimen Description EXPECTORATED SPUTUM  Final   Special Requests NONE  Final   Sputum evaluation   Final    Sputum specimen not acceptable for testing.  Please recollect.   Performed at [facility], [address], [place], [place] [address]    Report Status 04/30/2019 FINAL  Final    Today   Subjective    Hartlyn Reigel today has no headache,no chest abdominal pain,no new weakness tingling or numbness, feels much better wants to go home today.     Objective   Blood pressure 108/61, pulse 72, temperature 98.5 F (36.9 C), temperature source Oral, resp. rate 16, height _0  (1.651 m), weight 108 kg, SpO2 94 %.   Intake/Output Summary (Last 24 hours) at 05/03/2019 0916 Last data filed at 05/02/2019 1800 Gross per 24 hour  Intake 990  ml  Output -  Net 990 ml    Exam  Awake Alert,  No new F.N deficits, Normal affect [place].AT,PERRAL Supple Neck,No JVD, No cervical lymphadenopathy appriciated.  Symmetrical Chest wall movement, Good air movement bilaterally, CTAB RRR,No Gallops,Rubs or new Murmurs, No Parasternal Heave +ve B.Sounds, Abd Soft, Non tender, No organomegaly appriciated, No rebound -guarding or rigidity. No Cyanosis, Clubbing or edema, No new Rash or bruise   Data Review   CBC w Diff:  Lab Results  Component Value Date   WBC 8.9 05/03/2019   HGB 10.9 (L) 05/03/2019   HGB 13.5 08/11/2013   HCT 36.2 05/03/2019   HCT 42.6 08/11/2013   PLT 583 (H) 05/03/2019   PLT 300 08/11/2013   LYMPHOPCT 11 05/03/2019   LYMPHOPCT 31.6 08/11/2013   MONOPCT 6 05/03/2019   MONOPCT 5.6 08/11/2013   EOSPCT 0 05/03/2019   EOSPCT 11.6 08/11/2013   BASOPCT 1 05/03/2019   BASOPCT 1.1 08/11/2013    CMP:  Lab Results  Component Value Date   NA 142 05/03/2019   NA 138 08/11/2013   K 3.8 05/03/2019   K 3.3 (L) 08/11/2013   CL 107 05/03/2019   CL 102 08/11/2013   CO2 27 05/03/2019   CO2 32 08/11/2013   BUN 25 (H) 05/03/2019   BUN 13 08/11/2013   CREATININE 0.74 05/03/2019   CREATININE 0.86 02/23/2019   PROT 6.2 (L) 05/03/2019   PROT 8.8 (H) 08/11/2013   ALBUMIN 2.4 (L) 05/03/2019   ALBUMIN 3.1 (L) 08/11/2013   BILITOT 0.3 05/03/2019   BILITOT 0.3 08/11/2013   ALKPHOS 50 05/03/2019   ALKPHOS 91 08/11/2013   AST 18 05/03/2019   AST 27 08/11/2013   ALT 17 05/03/2019   ALT 26 08/11/2013  .   Total Time in preparing paper work, data evaluation and todays exam - 57 minutes  Lala Lund M.D on 05/03/2019 at 9:16 AM  [facility]   Office  [phone]

## 2019-05-03 NOTE — Discharge Instructions (Addendum)
You are scheduled for an outpatient infusion of Remdesivir at  0830AM on Tuesday 1/20 .  Please report to [name] at [address]  Drive to the security guard and tell them you are here for an infusion. They will direct you to the front entrance where we will come and get you.  For questions call [phone].     Follow with Primary MD Steele Sizer, MD in 7 days   Get CBC, CMP, 2 view Chest X ray -  checked next visit within 1 week by Primary MD    Activity: As tolerated with Full fall precautions use walker/cane & assistance as needed  Disposition Home    Diet: Heart Healthy Low Carb.  Special Instructions: If you have smoked or chewed Tobacco  in the last 2 yrs please stop smoking, stop any regular Alcohol  and or any Recreational drug use.  On your next visit with your primary care physician please Get Medicines reviewed and adjusted.  Please request your Prim.MD to go over all Hospital Tests and Procedure/Radiological results at the follow up, please get all Hospital records sent to your Prim MD by signing hospital release before you go home.  If you experience worsening of your admission symptoms, develop shortness of breath, life threatening emergency, suicidal or homicidal thoughts you must seek medical attention immediately by calling 911 or calling your MD immediately  if symptoms less severe.  You Must read complete instructions/literature along with all the possible adverse reactions/side effects for all the Medicines you take and that have been prescribed to you. Take any new Medicines after you have completely understood and accpet all the possible adverse reactions/side effects.      Person Under Monitoring Name: Alexandra Foster  Location: [place] [address]   Infection Prevention Recommendations for Individuals Confirmed to have, or Being Evaluated for, 2019 Novel Coronavirus (COVID-19) Infection Who Receive Care at  Home  Individuals who are confirmed to have, or are being evaluated for, COVID-19 should follow the prevention steps below until a healthcare provider or local or state health department says they can return to normal activities.  Stay home except to get medical care You should restrict activities outside your home, except for getting medical care. Do not go to work, school, or public areas, and do not use public transportation or taxis.  Call ahead before visiting your doctor Before your medical appointment, call the healthcare provider and tell them that you have, or are being evaluated for, COVID-19 infection. This will help the healthcare provider's office take steps to keep other people from getting infected. Ask your healthcare provider to call the local or state health department.  Monitor your symptoms Seek prompt medical attention if your illness is worsening (e.g., difficulty breathing). Before going to your medical appointment, call the healthcare provider and tell them that you have, or are being evaluated for, COVID-19 infection. Ask your healthcare provider to call the local or state health department.  Wear a facemask You should wear a facemask that covers your nose and mouth when you are in the same room with other people and when you visit a healthcare provider. People who live with or visit you should also wear a facemask while they are in the same room with you.  Separate yourself from other people in your home As much as possible, you should stay in a different room from other people in your home. Also, you should use a separate  bathroom, if available.  Avoid sharing household items You should not share dishes, drinking glasses, cups, eating utensils, towels, bedding, or other items with other people in your home. After using these items, you should wash them thoroughly with soap and water.  Cover your coughs and sneezes Cover your mouth and nose with a tissue  when you cough or sneeze, or you can cough or sneeze into your sleeve. Throw used tissues in a lined trash can, and immediately wash your hands with soap and water for at least 20 seconds or use an alcohol-based hand rub.  Wash your [facility] your hands often and thoroughly with soap and water for at least 20 seconds. You can use an alcohol-based hand sanitizer if soap and water are not available and if your hands are not visibly dirty. Avoid touching your eyes, nose, and mouth with unwashed hands.   Prevention Steps for Caregivers and Household Members of Individuals Confirmed to have, or Being Evaluated for, COVID-19 Infection Being Cared for in the Home  If you live with, or provide care at home for, a person confirmed to have, or being evaluated for, COVID-19 infection please follow these guidelines to prevent infection:  Follow healthcare provider's instructions Make sure that you understand and can help the patient follow any healthcare provider instructions for all care.  Provide for the patient's basic needs You should help the patient with basic needs in the home and provide support for getting groceries, prescriptions, and other personal needs.  Monitor the patient's symptoms If they are getting sicker, call his or her medical provider and tell them that the patient has, or is being evaluated for, COVID-19 infection. This will help the healthcare provider's office take steps to keep other people from getting infected. Ask the healthcare provider to call the local or state health department.  Limit the number of people who have contact with the patient  If possible, have only one caregiver for the patient.  Other household members should stay in another home or place of residence. If this is not possible, they should stay  in another room, or be separated from the patient as much as possible. Use a separate bathroom, if available.  Restrict visitors who do not have an  essential need to be in the home.  Keep older adults, very young children, and other sick people away from the patient Keep older adults, very young children, and those who have compromised immune systems or chronic health conditions away from the patient. This includes people with chronic heart, lung, or kidney conditions, diabetes, and cancer.  Ensure good ventilation Make sure that shared spaces in the home have good air flow, such as from an air conditioner or an opened window, weather permitting.  Wash your hands often  Wash your hands often and thoroughly with soap and water for at least 20 seconds. You can use an alcohol based hand sanitizer if soap and water are not available and if your hands are not visibly dirty.  Avoid touching your eyes, nose, and mouth with unwashed hands.  Use disposable paper towels to dry your hands. If not available, use dedicated cloth towels and replace them when they become wet.  Wear a facemask and gloves  Wear a disposable facemask at all times in the room and gloves when you touch or have contact with the patient's blood, body fluids, and/or secretions or excretions, such as sweat, saliva, sputum, nasal mucus, vomit, urine, or feces.  Ensure the mask fits  over your nose and mouth tightly, and do not touch it during use.  Throw out disposable facemasks and gloves after using them. Do not reuse.  Wash your hands immediately after removing your facemask and gloves.  If your personal clothing becomes contaminated, carefully remove clothing and launder. Wash your hands after handling contaminated clothing.  Place all used disposable facemasks, gloves, and other waste in a lined container before disposing them with other household waste.  Remove gloves and wash your hands immediately after handling these items.  Do not share dishes, glasses, or other household items with the patient  Avoid sharing household items. You should not share dishes,  drinking glasses, cups, eating utensils, towels, bedding, or other items with a patient who is confirmed to have, or being evaluated for, COVID-19 infection.  After the person uses these items, you should wash them thoroughly with soap and water.  Wash laundry thoroughly  Immediately remove and wash clothes or bedding that have blood, body fluids, and/or secretions or excretions, such as sweat, saliva, sputum, nasal mucus, vomit, urine, or feces, on them.  Wear gloves when handling laundry from the patient.  Read and follow directions on labels of laundry or clothing items and detergent. In general, wash and dry with the warmest temperatures recommended on the label.  Clean all areas the individual has used often  Clean all touchable surfaces, such as counters, tabletops, doorknobs, bathroom fixtures, toilets, phones, keyboards, tablets, and bedside tables, every day. Also, clean any surfaces that may have blood, body fluids, and/or secretions or excretions on them.  Wear gloves when cleaning surfaces the patient has come in contact with.  Use a diluted bleach solution (e.g., dilute bleach with 1 part bleach and 10 parts water) or a household disinfectant with a label that says EPA-registered for coronaviruses. To make a bleach solution at home, add 1 tablespoon of bleach to 1 quart (4 cups) of water. For a larger supply, add  cup of bleach to 1 gallon (16 cups) of water.  Read labels of cleaning products and follow recommendations provided on product labels. Labels contain instructions for safe and effective use of the cleaning product including precautions you should take when applying the product, such as wearing gloves or eye protection and making sure you have good ventilation during use of the product.  Remove gloves and wash hands immediately after cleaning.  Monitor yourself for signs and symptoms of illness Caregivers and household members are considered close contacts, should  monitor their health, and will be asked to limit movement outside of the home to the extent possible. Follow the monitoring steps for close contacts listed on the symptom monitoring form.   ? If you have additional questions, contact your local health department or call the epidemiologist on call at [phone] (available 24/7). ? This guidance is subject to change. For the most up-to-date guidance from [facility], please refer to their website: [email or web address]

## 2019-05-04 ENCOUNTER — Telehealth: Payer: Self-pay

## 2019-05-04 ENCOUNTER — Ambulatory Visit (HOSPITAL_COMMUNITY)
Admission: RE | Admit: 2019-05-04 | Discharge: 2019-05-04 | Disposition: A | Discharge status: Home / Self Care | Payer: Medicare HMO | Source: Ambulatory Visit | Attending: Pulmonary Disease | Admitting: Pulmonary Disease

## 2019-05-04 VITALS — BP 103/66 | HR 74 | Temp 98.2°F | Resp 15

## 2019-05-04 DIAGNOSIS — J1282 Pneumonia due to coronavirus disease 2019: Secondary | ICD-10-CM | POA: Diagnosis not present

## 2019-05-04 DIAGNOSIS — U071 COVID-19: Secondary | ICD-10-CM | POA: Diagnosis not present

## 2019-05-04 LAB — CULTURE, BLOOD (ROUTINE X 2)
Culture: NO GROWTH
Culture: NO GROWTH
Special Requests: ADEQUATE

## 2019-05-04 MED ORDER — EPINEPHRINE 0.3 MG/0.3ML IJ SOAJ: 0.3000 mg | Freq: Once | INTRAMUSCULAR | Status: DC | PRN

## 2019-05-04 MED ORDER — SODIUM CHLORIDE 0.9 % IV SOLN
100.0000 mg | Freq: Once | INTRAVENOUS | Status: AC
  Administered 2019-05-04: 100 mg via INTRAVENOUS

## 2019-05-04 MED ORDER — SODIUM CHLORIDE 0.9 % IV SOLN
INTRAVENOUS | Status: AC
  Filled 2019-05-04: qty 20

## 2019-05-04 MED ORDER — FAMOTIDINE IN NACL 20-0.9 MG/50ML-% IV SOLN: 20.0000 mg | Freq: Once | INTRAVENOUS | Status: DC | PRN

## 2019-05-04 MED ORDER — DIPHENHYDRAMINE HCL 50 MG/ML IJ SOLN: 50.0000 mg | Freq: Once | INTRAMUSCULAR | Status: DC | PRN

## 2019-05-04 MED ORDER — METHYLPREDNISOLONE SODIUM SUCC 125 MG IJ SOLR: 125.0000 mg | Freq: Once | INTRAMUSCULAR | Status: DC | PRN

## 2019-05-04 MED ORDER — ALBUTEROL SULFATE HFA 108 (90 BASE) MCG/ACT IN AERS: 2.0000 | INHALATION_SPRAY | Freq: Once | RESPIRATORY_TRACT | Status: DC | PRN

## 2019-05-04 MED ORDER — SODIUM CHLORIDE 0.9 % IV SOLN
INTRAVENOUS | Status: DC | PRN
  Administered 2019-05-04: 250 mL via INTRAVENOUS

## 2019-05-04 NOTE — Progress Notes (Signed)
  Diagnosis: COVID-19  Physician:Dr. Joya Gaskins  Procedure: [facility] Infusion Clinic Med: remdesivir infusion.  Complications: No immediate complications noted.  Discharge: Discharged home   Alexandra Foster 05/04/2019

## 2019-05-04 NOTE — Progress Notes (Signed)
  Diagnosis: COVID-19  Physician: Dr. Joya Gaskins  Procedure: [facility] Infusion Clinic Med: remdesivir infusion.  Complications: No immediate complications noted.  Discharge: Discharged home   Alexandra Foster 05/04/2019

## 2019-05-04 NOTE — Telephone Encounter (Signed)
Transition Care Management Follow-up Telephone Call  Date of discharge and from where: 05/03/19 [facility]  How have you been since you were released from the hospital? Pt states she is doing okay, received infusion today. SOB on exertion and some fatigue.   Any questions or concerns? No   Items Reviewed:  Did the pt receive and understand the discharge instructions provided? Yes   Medications obtained and verified? Yes   Any new allergies since your discharge? No   Dietary orders reviewed? Yes  Do you have support at home? Yes   Functional Questionnaire: (I = Independent and D = Dependent) ADLs: I  Bathing/Dressing- I  Meal Prep- D while in quarantine but husband at home  Eating- I  Maintaining continence- I  Transferring/Ambulation- I  Managing Meds- I  Follow up appointments reviewed:   PCP Hospital f/u appt confirmed? No  Pt needs appt within one week, Dr. Ancil Boozer booked, msg sent to request overbook or f/u with NP/PA.  Are transportation arrangements needed? No   If their condition worsens, is the pt aware to call PCP or go to the Emergency Dept.? Yes  Was the patient provided with contact information for the PCP's office or ED? Yes  Was to pt encouraged to call back with questions or concerns? Yes

## 2019-05-04 NOTE — Discharge Instructions (Signed)
10 Things You Can Do to Manage Your COVID-19 Symptoms at Home If you have possible or confirmed COVID-19: 1. Stay home from work and school. And stay away from other public places. If you must go out, avoid using any kind of public transportation, ridesharing, or taxis. 2. Monitor your symptoms carefully. If your symptoms get worse, call your healthcare provider immediately. 3. Get rest and stay hydrated. 4. If you have a medical appointment, call the healthcare provider ahead of time and tell them that you have or may have COVID-19. 5. For medical emergencies, call 911 and notify the dispatch personnel that you have or may have COVID-19. 6. Cover your cough and sneezes with a tissue or use the inside of your elbow. 7. Wash your hands often with soap and water for at least 20 seconds or clean your hands with an alcohol-based hand sanitizer that contains at least 60% alcohol. 8. As much as possible, stay in a specific room and away from other people in your home. Also, you should use a separate bathroom, if available. If you need to be around other people in or outside of the home, wear a mask. 9. Avoid sharing personal items with other people in your household, like dishes, towels, and bedding. 10. Clean all surfaces that are touched often, like counters, tabletops, and doorknobs. Use household cleaning sprays or wipes according to the label instructions. cdc.gov/coronavirus 10/13/2018 This information is not intended to replace advice given to you by your health care provider. Make sure you discuss any questions you have with your health care provider. Document Revised: 03/17/2019 Document Reviewed: 03/17/2019 Elsevier Patient Education  2020 Elsevier Inc.  

## 2019-05-09 NOTE — Telephone Encounter (Signed)
Called pt to let her know of her appt on 05-12-2019 AT 10:20

## 2019-05-09 NOTE — Telephone Encounter (Signed)
Please let us know where to schedule her

## 2019-05-09 NOTE — Telephone Encounter (Signed)
Patient called in and became very agitated that no one from office has called her to set up a hospital follow up visit. Attempted to set patient up for an appointment and patient because frustrated, due to still having symptoms of covid-19 appointment turned to virtual visit, and stated she doesn't have time for this foolishness, will find a new doctor, wants to be seen by [place], and so forth. Attempted to make appointment again for patient, advising her of the way the appointment may be, pt states she doesn't care what we do and disconnected the phone. Please advise.

## 2019-05-12 ENCOUNTER — Encounter: Payer: Self-pay | Admitting: Family Medicine

## 2019-05-12 ENCOUNTER — Ambulatory Visit (INDEPENDENT_AMBULATORY_CARE_PROVIDER_SITE_OTHER): Payer: Medicare HMO | Admitting: Family Medicine

## 2019-05-12 ENCOUNTER — Other Ambulatory Visit: Payer: Self-pay

## 2019-05-12 DIAGNOSIS — Z09 Encounter for follow-up examination after completed treatment for conditions other than malignant neoplasm: Secondary | ICD-10-CM

## 2019-05-12 DIAGNOSIS — E441 Mild protein-calorie malnutrition: Secondary | ICD-10-CM

## 2019-05-12 DIAGNOSIS — J1282 Pneumonia due to coronavirus disease 2019: Secondary | ICD-10-CM | POA: Diagnosis not present

## 2019-05-12 DIAGNOSIS — U071 COVID-19: Secondary | ICD-10-CM | POA: Diagnosis not present

## 2019-05-12 NOTE — Progress Notes (Signed)
Name: Alexandra Foster   MRN: [id number]    DOB: [birth year]   Date:05/12/2019       Progress Note  Subjective  Chief Complaint  Chief Complaint  Patient presents with  . Hospitalization Follow-up    I connected with  Judith Blonder Cordle on 05/12/19 at 10:20 AM EST by telephone and verified that I am speaking with the correct person using two identifiers.  I discussed the limitations, risks, security and privacy concerns of performing an evaluation and management service by telephone and the availability of in person appointments. Staff also discussed with the patient that there may be a patient responsible charge related to this service. Patient Location: at home  Provider Location: [facility]   HPI  COVID-19 pneumonia: she went to Urgent Care ( not sure of the name) on 04/29/2019 because she had been exposed to COVID-19 , she was found to be hypoxic and sent to [facility] for further evaluation, at [facility] she was found to have pulse ox of 87 % on room air and RR of 23-27 per minute, CXR showed multifocal pneumonia.  Initial evaluation showed   very high d- dimer, platelets of 440 , mild anemia, low potassium , albumin of 2.8, LDH 308, positive SARS coronavirus 2 ag and CRP,  At [facility] she was given nasal canula  oxygen and started on steroids, remdesivir , zinc and vitamin C, she was  transferred to [facility] on the 16 th, she improved clinically and was discharged home on the 01/19 with recommendation to go back for  Remdesevir  infusion on 01/20 . She did everything as recommended. Per discharge instructions she is due for repeat CXR and labs that we will order today. She states she is feeling much better, no fever or chills, very mild cough and states  she still has mild sob with activity . She states appetite is back to normal, no lack of taste, but she has chronic lack of sense of smell.   She denies nausea, vomiting, change in bowel movements. Reviewed labs and albumin was  low, discussed adding Glucerna once a day. She states her glucose has been at goal int he 120's range or lower.   Patient Active Problem List   Diagnosis Date Noted  . Pneumonia due to COVID-19 virus 04/30/2019  . COVID-19 04/29/2019  . Bilateral lower extremity edema 05/03/2018  . Lower extremity pain, bilateral 05/03/2018  . Diabetes mellitus type 2 in obese ([place]) 04/23/2018  . Osteoarthritis of both knees 01/14/2018  . Urge incontinence 03/14/2016  . Osteopenia 02/13/2016  . OSA (obstructive sleep apnea) 01/08/2016  . Allergic rhinitis 04/13/2015  . Arterial vascular disease 09/18/2014  . Carotid artery plaque 09/18/2014  . COPD (chronic obstructive pulmonary disease) ([place]) 09/18/2014  . Morbid obesity ([place]) 09/18/2014  . Esophagitis, reflux 09/18/2014  . Essential hypertension 09/21/2006    Past Surgical History:  Procedure Laterality Date  . ABDOMINAL HYSTERECTOMY    . COLONOSCOPY  10/29/2006   Dr Allen Norris  . COLONOSCOPY WITH PROPOFOL N/A 11/05/2016   Procedure: COLONOSCOPY WITH PROPOFOL;  Surgeon: Robert Bellow, MD;  Location: [facility] ENDOSCOPY;  Service: Endoscopy;  Laterality: N/A;  . NASAL SINUS SURGERY  2002   Dr Carlis Abbott    Family History  Problem Relation Age of Onset  . Congestive Heart Failure Mother   . Coronary artery disease Father   . Breast cancer Sister 53  . Heart disease Brother   . Varicose Veins Brother   .  Alcohol abuse Brother     Social History   Socioeconomic History  . Marital status: Married    Spouse name: Trilby Drummer  . Number of children: 1  . Years of education: 26  . Highest education level: 12th grade  Occupational History    Employer: RETIRED  Tobacco Use  . Smoking status: Former Smoker    Packs/day: 2.00    Years: 40.00    Pack years: 80.00    Types: Cigarettes    Quit date: 2008    Years since quitting: 13.0  . Smokeless tobacco: Former Systems developer    Types: Snuff    Quit date: 04/2001  . Tobacco comment: smoking cessation  materials not required  Substance and Sexual Activity  . Alcohol use: No    Alcohol/week: 0.0 standard drinks  . Drug use: No  . Sexual activity: Not Currently  Other Topics Concern  . Not on file  Social History Narrative  . Not on file   Social Determinants of Health   Financial Resource Strain:   . Difficulty of Paying Living Expenses: Not on file  Food Insecurity:   . Worried About Charity fundraiser in the Last Year: Not on file  . Ran Out of Food in the Last Year: Not on file  Transportation Needs:   . Lack of Transportation (Medical): Not on file  . Lack of Transportation (Non-Medical): Not on file  Physical Activity:   . Days of Exercise per Week: Not on file  . Minutes of Exercise per Session: Not on file  Stress:   . Feeling of Stress : Not on file  Social Connections:   . Frequency of Communication with Friends and Family: Not on file  . Frequency of Social Gatherings with Friends and Family: Not on file  . Attends Religious Services: Not on file  . Active Member of Clubs or Organizations: Not on file  . Attends Archivist Meetings: Not on file  . Marital Status: Not on file  Intimate Partner Violence:   . Fear of Current or Ex-Partner: Not on file  . Emotionally Abused: Not on file  . Physically Abused: Not on file  . Sexually Abused: Not on file     Current Outpatient Medications:  .  albuterol (PROVENTIL HFA;VENTOLIN HFA) 108 (90 Base) MCG/ACT inhaler, Inhale 1-2 puffs into the lungs every 4 (four) hours as needed for wheezing or shortness of breath., Disp: 1 Inhaler, Rfl: 2 .  albuterol (PROVENTIL) (2.5 MG/3ML) 0.083% nebulizer solution, Take 3 mLs (2.5 mg total) by nebulization every 6 (six) hours as needed for wheezing or shortness of breath., Disp: 150 mL, Rfl: 1 .  aspirin 81 MG tablet, Take 1 tablet (81 mg total) by mouth daily., Disp: 30 tablet, Rfl: 0 .  atorvastatin (LIPITOR) 40 MG tablet, TAKE 1 TABLET EVERY DAY, Disp: 90 tablet, Rfl: 1  .  Blood Glucose Calibration (ACCU-CHEK AVIVA) SOLN, 1 each by In Vitro route daily., Disp: 1 each, Rfl: 0 .  Blood Glucose Monitoring Suppl (ACCU-CHEK AVIVA PLUS) w/Device KIT, USE AS DIRECTED, Disp: 1 kit, Rfl: 0 .  calcium carbonate (OSCAL) 1500 (600 Ca) MG TABS tablet, Take 600 mg of elemental calcium by mouth 2 (two) times daily with a meal., Disp: , Rfl:  .  docusate sodium (COLACE) 100 MG capsule, Take 100 mg by mouth daily., Disp: , Rfl:  .  glucose blood (ACCU-CHEK AVIVA PLUS) test strip, Use as instructed, Disp: 100 each, Rfl: 12 .  Lancets (ACCU-CHEK SOFT TOUCH) lancets, Use as instructed, Disp: 100 each, Rfl: 12 .  metFORMIN (GLUCOPHAGE-XR) 750 MG 24 hr tablet, TAKE 1 TABLET DAILY WITH BREAKFAST., Disp: 90 tablet, Rfl: 1 .  montelukast (SINGULAIR) 10 MG tablet, Take 10 mg by mouth at bedtime., Disp: , Rfl:  .  Multiple Vitamins-Minerals (MULTIVITAL PO), Take 1 tablet by mouth daily., Disp: , Rfl:  .  omeprazole (PRILOSEC) 20 MG capsule, TAKE 1 CAPSULE EVERY DAY, Disp: 90 capsule, Rfl: 1 .  Promethazine-Codeine 6.25-10 MG/5ML SOLN, Take 5 mLs by mouth 2 (two) times daily., Disp: 180 mL, Rfl: 0 .  triamterene-hydrochlorothiazide (DYAZIDE) 37.5-25 MG capsule, TAKE 1 CAPSULE EVERY DAY, Disp: 90 capsule, Rfl: 1 .  umeclidinium-vilanterol (ANORO ELLIPTA) 62.5-25 MCG/INH AEPB, Inhale 1 puff into the lungs daily., Disp: 180 each, Rfl: 1 .  vitamin B-12 (CYANOCOBALAMIN) 100 MCG tablet, Take 100 mcg by mouth daily., Disp: , Rfl:   No Known Allergies  I personally reviewed active problem list, medication list, allergies, family history, social history with the patient/caregiver today.   ROS  Ten systems reviewed and is negative except as mentioned in HPI   Objective  Virtual encounter, vitals not obtained.  There is no height or weight on file to calculate BMI.  Physical Exam  Awake, alert and oriented  PHQ2/9: Depression screen [facility] 2/9 05/12/2019 04/28/2019 12/15/2018 06/29/2018  04/23/2018  Decreased Interest 0 0 0 0 0  Down, Depressed, Hopeless 0 0 0 0 0  PHQ - 2 Score 0 0 0 0 0  Altered sleeping 0 0 0 0 0  Tired, decreased energy 0 0 0 0 1  Change in appetite 0 0 0 0 0  Feeling bad or failure about yourself  0 0 0 0 0  Trouble concentrating 0 0 0 0 0  Moving slowly or fidgety/restless 0 0 0 0 0  Suicidal thoughts 0 0 0 0 0  PHQ-9 Score 0 0 0 0 1  Difficult doing work/chores - Not difficult at all Not difficult at all - Not difficult at all  Some recent data might be hidden   PHQ-2/9 Result is negative.    Fall Risk: Fall Risk  05/12/2019 04/28/2019 12/15/2018 06/29/2018 04/23/2018  Falls in the past year? 0 0 0 0 0  Number falls in past yr: 0 0 0 0 0  Injury with Fall? 0 0 0 0 0  Follow up - - - - Falls evaluation completed    Assessment & Plan  1. Pneumonia due to COVID-19 virus  - DG Chest 2 View; Future - Comprehensive metabolic panel; Future - CBC; Future  2. Hospital discharge follow-up   3. Mild protein-calorie malnutrition ([place])  She will add glucerna once daily   I discussed the assessment and treatment plan with the patient. The patient was provided an opportunity to ask questions and all were answered. The patient agreed with the plan and demonstrated an understanding of the instructions.   The patient was advised to call back or seek an in-person evaluation if the symptoms worsen or if the condition fails to improve as anticipated.  I provided 25 minutes of non-face-to-face time during this encounter.  Loistine Chance, MD

## 2019-05-17 ENCOUNTER — Ambulatory Visit
Admission: RE | Admit: 2019-05-17 | Discharge: 2019-05-17 | Disposition: A | Discharge status: Home / Self Care | Payer: Medicare HMO | Source: Ambulatory Visit | Attending: Family Medicine | Admitting: Family Medicine

## 2019-05-17 ENCOUNTER — Other Ambulatory Visit
Admission: RE | Admit: 2019-05-17 | Discharge: 2019-05-17 | Disposition: A | Discharge status: Home / Self Care | Payer: Medicare HMO | Source: Ambulatory Visit | Attending: Family Medicine | Admitting: Family Medicine

## 2019-05-17 ENCOUNTER — Other Ambulatory Visit: Payer: Self-pay

## 2019-05-17 DIAGNOSIS — J1282 Pneumonia due to coronavirus disease 2019: Secondary | ICD-10-CM

## 2019-05-17 DIAGNOSIS — J181 Lobar pneumonia, unspecified organism: Secondary | ICD-10-CM | POA: Diagnosis not present

## 2019-05-17 DIAGNOSIS — U071 COVID-19: Secondary | ICD-10-CM | POA: Diagnosis not present

## 2019-05-17 LAB — COMPREHENSIVE METABOLIC PANEL
ALT: 14 U/L (ref 0–44)
AST: 19 U/L (ref 15–41)
Albumin: 3.2 g/dL — ABNORMAL LOW (ref 3.5–5.0)
Alkaline Phosphatase: 69 U/L (ref 38–126)
Anion gap: 12 (ref 5–15)
BUN: 16 mg/dL (ref 8–23)
CO2: 28 mmol/L (ref 22–32)
Calcium: 9.2 mg/dL (ref 8.9–10.3)
Chloride: 103 mmol/L (ref 98–111)
Creatinine, Ser: 0.76 mg/dL (ref 0.44–1.00)
GFR calc Af Amer: >60 mL/min (ref >60)
GFR calc non Af Amer: >60 mL/min (ref >60)
Glucose, Bld: 108 mg/dL — ABNORMAL HIGH (ref 70–99)
Potassium: 3.6 mmol/L (ref 3.5–5.1)
Sodium: 143 mmol/L (ref 135–145)
Total Bilirubin: 0.7 mg/dL (ref 0.3–1.2)
Total Protein: 7.3 g/dL (ref 6.5–8.1)

## 2019-05-17 LAB — CBC
HCT: 40.5 % (ref 36.0–46.0)
Hemoglobin: 12.2 g/dL (ref 12.0–15.0)
MCH: 24.7 pg — ABNORMAL LOW (ref 26.0–34.0)
MCHC: 30.1 g/dL (ref 30.0–36.0)
MCV: 82 fL (ref 80.0–100.0)
Platelets: 323 10*3/uL (ref 150–400)
RBC: 4.94 MIL/uL (ref 3.87–5.11)
RDW: 15.5 % (ref 11.5–15.5)
WBC: 7.2 10*3/uL (ref 4.0–10.5)
nRBC: 0 % (ref 0.0–0.2)

## 2019-05-24 ENCOUNTER — Other Ambulatory Visit: Payer: Self-pay

## 2019-05-26 ENCOUNTER — Other Ambulatory Visit: Payer: Self-pay

## 2019-05-26 ENCOUNTER — Ambulatory Visit (INDEPENDENT_AMBULATORY_CARE_PROVIDER_SITE_OTHER): Payer: Medicare HMO

## 2019-05-26 VITALS — BP 110/70 | HR 75 | Temp 97.1°F | Resp 15 | Ht 65.0 in | Wt 250.4 lb

## 2019-05-26 DIAGNOSIS — Z0000 Encounter for general adult medical examination without abnormal findings: Secondary | ICD-10-CM | POA: Diagnosis not present

## 2019-05-26 NOTE — Patient Instructions (Signed)
[name] , Thank you for taking time to come for your Medicare Wellness Visit. I appreciate your ongoing commitment to your health goals. Please review the following plan we discussed and let me know if I can assist you in the future.   Screening recommendations/referrals: Colonoscopy: done 11/05/16 Mammogram: done 11/17/18 Bone Density: done 11/17/18 Recommended yearly ophthalmology/optometry visit for glaucoma screening and checkup Recommended yearly dental visit for hygiene and checkup  Vaccinations: Influenza vaccine: done 05/02/19 Pneumococcal vaccine: done 05/02/19 Tdap vaccine: due Shingles vaccine: Shingrix discussed. Please contact your pharmacy for coverage information.   Advanced directives: Please bring a copy of your health care power of attorney and living will to the office at your convenience.  Conditions/risks identified: Keep up the great work!  Next appointment: Please follow up in one year for your Medicare Annual Wellness visit.     Preventive Care 31 Years and Older, Female Preventive care refers to lifestyle choices and visits with your health care provider that can promote health and wellness. What does preventive care include?  A yearly physical exam. This is also called an annual well check.  Dental exams once or twice a year.  Routine eye exams. Ask your health care provider how often you should have your eyes checked.  Personal lifestyle choices, including:  Daily care of your teeth and gums.  Regular physical activity.  Eating a healthy diet.  Avoiding tobacco and drug use.  Limiting alcohol use.  Practicing safe sex.  Taking low-dose aspirin every day.  Taking vitamin and mineral supplements as recommended by your health care provider. What happens during an annual well check? The services and screenings done by your health care provider during your annual well check will depend on your age, overall health, lifestyle risk factors, and  family history of disease. Counseling  Your health care provider may ask you questions about your:  Alcohol use.  Tobacco use.  Drug use.  Emotional well-being.  Home and relationship well-being.  Sexual activity.  Eating habits.  History of falls.  Memory and ability to understand (cognition).  Work and work Statistician.  Reproductive health. Screening  You may have the following tests or measurements:  Height, weight, and BMI.  Blood pressure.  Lipid and cholesterol levels. These may be checked every 5 years, or more frequently if you are over 30 years old.  Skin check.  Lung cancer screening. You may have this screening every year starting at age 73 if you have a 30-pack-year history of smoking and currently smoke or have quit within the past 15 years.  Fecal occult blood test (FOBT) of the stool. You may have this test every year starting at age 3.  Flexible sigmoidoscopy or colonoscopy. You may have a sigmoidoscopy every 5 years or a colonoscopy every 10 years starting at age 73.  Hepatitis C blood test.  Hepatitis B blood test.  Sexually transmitted disease (STD) testing.  Diabetes screening. This is done by checking your blood sugar (glucose) after you have not eaten for a while (fasting). You may have this done every 1-3 years.  Bone density scan. This is done to screen for osteoporosis. You may have this done starting at age 70.  Mammogram. This may be done every 1-2 years. Talk to your health care provider about how often you should have regular mammograms. Talk with your health care provider about your test results, treatment options, and if necessary, the need for more tests. Vaccines  Your health care provider may  recommend certain vaccines, such as:  Influenza vaccine. This is recommended every year.  Tetanus, diphtheria, and acellular pertussis (Tdap, Td) vaccine. You may need a Td booster every 10 years.  Zoster vaccine. You may need this  after age 60.  Pneumococcal 13-valent conjugate (PCV13) vaccine. One dose is recommended after age 69.  Pneumococcal polysaccharide (PPSV23) vaccine. One dose is recommended after age 81. Talk to your health care provider about which screenings and vaccines you need and how often you need them. This information is not intended to replace advice given to you by your health care provider. Make sure you discuss any questions you have with your health care provider. Document Released: 04/27/2015 Document Revised: 12/19/2015 Document Reviewed: 01/30/2015 Elsevier Interactive Patient Education  2017 [place] Prevention in the Home Falls can cause injuries. They can happen to people of all ages. There are many things you can do to make your home safe and to help prevent falls. What can I do on the outside of my home?  Regularly fix the edges of walkways and driveways and fix any cracks.  Remove anything that might make you trip as you walk through a door, such as a raised step or threshold.  Trim any bushes or trees on the path to your home.  Use bright outdoor lighting.  Clear any walking paths of anything that might make someone trip, such as rocks or tools.  Regularly check to see if handrails are loose or broken. Make sure that both sides of any steps have handrails.  Any raised decks and porches should have guardrails on the edges.  Have any leaves, snow, or ice cleared regularly.  Use sand or salt on walking paths during winter.  Clean up any spills in your garage right away. This includes oil or grease spills. What can I do in the bathroom?  Use night lights.  Install grab bars by the toilet and in the tub and shower. Do not use towel bars as grab bars.  Use non-skid mats or decals in the tub or shower.  If you need to sit down in the shower, use a plastic, non-slip stool.  Keep the floor dry. Clean up any water that spills on the floor as soon as it  happens.  Remove soap buildup in the tub or shower regularly.  Attach bath mats securely with double-sided non-slip rug tape.  Do not have throw rugs and other things on the floor that can make you trip. What can I do in the bedroom?  Use night lights.  Make sure that you have a light by your bed that is easy to reach.  Do not use any sheets or blankets that are too big for your bed. They should not hang down onto the floor.  Have a firm chair that has side arms. You can use this for support while you get dressed.  Do not have throw rugs and other things on the floor that can make you trip. What can I do in the kitchen?  Clean up any spills right away.  Avoid walking on wet floors.  Keep items that you use a lot in easy-to-reach places.  If you need to reach something above you, use a strong step stool that has a grab bar.  Keep electrical cords out of the way.  Do not use floor polish or wax that makes floors slippery. If you must use wax, use non-skid floor wax.  Do not have throw rugs and  other things on the floor that can make you trip. What can I do with my stairs?  Do not leave any items on the stairs.  Make sure that there are handrails on both sides of the stairs and use them. Fix handrails that are broken or loose. Make sure that handrails are as long as the stairways.  Check any carpeting to make sure that it is firmly attached to the stairs. Fix any carpet that is loose or worn.  Avoid having throw rugs at the top or bottom of the stairs. If you do have throw rugs, attach them to the floor with carpet tape.  Make sure that you have a light switch at the top of the stairs and the bottom of the stairs. If you do not have them, ask someone to add them for you. What else can I do to help prevent falls?  Wear shoes that:  Do not have high heels.  Have rubber bottoms.  Are comfortable and fit you well.  Are closed at the toe. Do not wear sandals.  If you  use a stepladder:  Make sure that it is fully opened. Do not climb a closed stepladder.  Make sure that both sides of the stepladder are locked into place.  Ask someone to hold it for you, if possible.  Clearly mark and make sure that you can see:  Any grab bars or handrails.  First and last steps.  Where the edge of each step is.  Use tools that help you move around (mobility aids) if they are needed. These include:  Canes.  Walkers.  Scooters.  Crutches.  Turn on the lights when you go into a dark area. Replace any light bulbs as soon as they burn out.  Set up your furniture so you have a clear path. Avoid moving your furniture around.  If any of your floors are uneven, fix them.  If there are any pets around you, be aware of where they are.  Review your medicines with your doctor. Some medicines can make you feel dizzy. This can increase your chance of falling. Ask your doctor what other things that you can do to help prevent falls. This information is not intended to replace advice given to you by your health care provider. Make sure you discuss any questions you have with your health care provider. Document Released: 01/25/2009 Document Revised: 09/06/2015 Document Reviewed: 05/05/2014 Elsevier Interactive Patient Education  2017 [employer].

## 2019-05-26 NOTE — Progress Notes (Signed)
Subjective:   Alexandra Foster is a 69 y.o. female who presents for Medicare Annual (Subsequent) preventive examination.  Review of Systems:   Cardiac Risk Factors include: advanced age (>54mn, >>58women);diabetes mellitus;dyslipidemia;hypertension;obesity (BMI >30kg/m2)     Objective:     Vitals: BP 110/70 (BP Location: Right Arm, Patient Position: Sitting, Cuff Size: Large)   Pulse 75   Temp (!) 97.1 F (36.2 C) (Temporal)   Resp 15   Ht _0  (1.651 m)   Wt 250 lb 6.4 oz (113.6 kg)   SpO2 92%   BMI 41.67 kg/m   Body mass index is 41.67 kg/m.  Advanced Directives 04/30/2019 04/29/2019 04/22/2018 11/17/2017 04/21/2017 11/05/2016 01/08/2016  Does Patient Have a Medical Advance Directive? Yes Yes No Yes No No No  Type of Advance Directive Living will Living will - [place] - - -  Does patient want to make changes to medical advance directive? No - Patient declined - - - - - -  Would patient like information on creating a medical advance directive? - No - Patient declined Yes (MAU/Ambulatory/Procedural Areas - Information given) - Yes (MAU/Ambulatory/Procedural Areas - Information given) No - Patient declined -    Tobacco Social History   Tobacco Use  Smoking Status Former Smoker  . Packs/day: 2.00  . Years: 40.00  . Pack years: 80.00  . Types: Cigarettes  . Quit date: 2008  . Years since quitting: 13.1  Smokeless Tobacco Former USystems developer . Types: Snuff  . Quit date: 04/2001  Tobacco Comment   smoking cessation materials not required     Counseling given: Not Answered Comment: smoking cessation materials not required   Clinical Intake:  Pre-visit preparation completed: Yes  Pain : No/denies pain     BMI - recorded: 41.67 Nutritional Status: BMI > 30  Obese Nutritional Risks: None Diabetes: Yes CBG done?: No Did pt. bring in CBG monitor from home?: No   Nutrition Risk Assessment:  Has the patient had any N/V/D within the last  2 months?  Yes  Does the patient have any non-healing wounds?  No  Has the patient had any unintentional weight loss or weight gain?  No   Diabetes:  Is the patient diabetic?  Yes  If diabetic, was a CBG obtained today?  No  Did the patient bring in their glucometer from home?  No  How often do you monitor your CBG's? Twice daily.   Financial Strains and Diabetes Management:  Are you having any financial strains with the device, your supplies or your medication? No .  Does the patient want to be seen by Chronic Care Management for management of their diabetes?  No  Would the patient like to be referred to a Nutritionist or for Diabetic Management?  No   Diabetic Exams:  Diabetic Eye Exam: Completed 03/24/19 per patient by Dr. BGloriann Loan will request copy of diabetic eye exam.   Diabetic Foot Exam: Completed 03/09/18. Pt has been advised about the importance in completing this exam. Pt is scheduled for diabetic foot exam on 06/29/19.   How often do you need to have someone help you when you read instructions, pamphlets, or other written materials from your doctor or pharmacy?: 1 - Never  Interpreter Needed?: No  Information entered by :: KClemetine MarkerLPN  Past Medical History:  Diagnosis Date  . Arthritis   . Asthma   . COPD (chronic obstructive pulmonary disease) ([place]   . Diabetes mellitus without  complication ([place])   . Endometriosis   . GERD (gastroesophageal reflux disease)   . Hypertension   . Obesity   . Sleep apnea    CPAP   Past Surgical History:  Procedure Laterality Date  . ABDOMINAL HYSTERECTOMY    . COLONOSCOPY  10/29/2006   Dr Allen Norris  . COLONOSCOPY WITH PROPOFOL N/A 11/05/2016   Procedure: COLONOSCOPY WITH PROPOFOL;  Surgeon: Robert Bellow, MD;  Location: [facility] ENDOSCOPY;  Service: Endoscopy;  Laterality: N/A;  . NASAL SINUS SURGERY  2002   Dr Carlis Abbott   Family History  Problem Relation Age of Onset  . Congestive Heart Failure Mother   . Coronary artery  disease Father   . Breast cancer Sister 78  . Heart disease Brother   . Varicose Veins Brother   . Alcohol abuse Brother    Social History   Socioeconomic History  . Marital status: Married    Spouse name: Trilby Drummer  . Number of children: 1  . Years of education: 51  . Highest education level: 12th grade  Occupational History    Employer: RETIRED  Tobacco Use  . Smoking status: Former Smoker    Packs/day: 2.00    Years: 40.00    Pack years: 80.00    Types: Cigarettes    Quit date: 2008    Years since quitting: 13.1  . Smokeless tobacco: Former Systems developer    Types: Snuff    Quit date: 04/2001  . Tobacco comment: smoking cessation materials not required  Substance and Sexual Activity  . Alcohol use: No    Alcohol/week: 0.0 standard drinks  . Drug use: No  . Sexual activity: Not Currently  Other Topics Concern  . Not on file  Social History Narrative  . Not on file   Social Determinants of Health   Financial Resource Strain: Low Risk   . Difficulty of Paying Living Expenses: Not hard at all  Food Insecurity: No Food Insecurity  . Worried About Charity fundraiser in the Last Year: Never true  . Ran Out of Food in the Last Year: Never true  Transportation Needs: No Transportation Needs  . Lack of Transportation (Medical): No  . Lack of Transportation (Non-Medical): No  Physical Activity: Inactive  . Days of Exercise per Week: 0 days  . Minutes of Exercise per Session: 0 min  Stress: No Stress Concern Present  . Feeling of Stress : Not at all  Social Connections: Slightly Isolated  . Frequency of Communication with Friends and Family: More than three times a week  . Frequency of Social Gatherings with Friends and Family: More than three times a week  . Attends Religious Services: 1 to 4 times per year  . Active Member of Clubs or Organizations: No  . Attends Archivist Meetings: Never  . Marital Status: Married    Outpatient Encounter Medications as of  05/26/2019  Medication Sig  . albuterol (PROVENTIL HFA;VENTOLIN HFA) 108 (90 Base) MCG/ACT inhaler Inhale 1-2 puffs into the lungs every 4 (four) hours as needed for wheezing or shortness of breath.  [name] aspirin 81 MG tablet Take 1 tablet (81 mg total) by mouth daily.  [name] atorvastatin (LIPITOR) 40 MG tablet TAKE 1 TABLET EVERY DAY  . Blood Glucose Calibration (ACCU-CHEK AVIVA) SOLN 1 each by In Vitro route daily.  . Blood Glucose Monitoring Suppl (ACCU-CHEK AVIVA PLUS) w/Device KIT USE AS DIRECTED  . calcium carbonate (OSCAL) 1500 (600 Ca) MG TABS tablet Take 600 mg of  elemental calcium by mouth 2 (two) times daily with a meal.  . docusate sodium (COLACE) 100 MG capsule Take 100 mg by mouth daily.  [name] glucose blood (ACCU-CHEK AVIVA PLUS) test strip Use as instructed  . Lancets (ACCU-CHEK SOFT TOUCH) lancets Use as instructed  . metFORMIN (GLUCOPHAGE-XR) 750 MG 24 hr tablet TAKE 1 TABLET DAILY WITH BREAKFAST.  . montelukast (SINGULAIR) 10 MG tablet Take 10 mg by mouth at bedtime.  . Multiple Vitamins-Minerals (MULTIVITAL PO) Take 1 tablet by mouth daily.  [name] omeprazole (PRILOSEC) 20 MG capsule TAKE 1 CAPSULE EVERY DAY  . triamterene-hydrochlorothiazide (DYAZIDE) 37.5-25 MG capsule TAKE 1 CAPSULE EVERY DAY  . umeclidinium-vilanterol (ANORO ELLIPTA) 62.5-25 MCG/INH AEPB Inhale 1 puff into the lungs daily.  . vitamin B-12 (CYANOCOBALAMIN) 100 MCG tablet Take 100 mcg by mouth daily.  [name] albuterol (PROVENTIL) (2.5 MG/3ML) 0.083% nebulizer solution Take 3 mLs (2.5 mg total) by nebulization every 6 (six) hours as needed for wheezing or shortness of breath. (Patient not taking: Reported on 05/26/2019)  . Promethazine-Codeine 6.25-10 MG/5ML SOLN Take 5 mLs by mouth 2 (two) times daily. (Patient not taking: Reported on 05/26/2019)   No facility-administered encounter medications on file as of 05/26/2019.    Activities of Daily Living In your present state of health, do you have any difficulty performing the  following activities: 05/26/2019 05/12/2019  Hearing? N N  Comment declines hearing aids -  Vision? N N  Difficulty concentrating or making decisions? N N  Walking or climbing stairs? N N  Dressing or bathing? N N  Doing errands, shopping? N N  Preparing Food and eating ? N -  Using the Toilet? N -  In the past six months, have you accidently leaked urine? N -  Do you have problems with loss of bowel control? N -  Managing your Medications? N -  Managing your Finances? N -  Housekeeping or managing your Housekeeping? N -  Some recent data might be hidden    Patient Care Team: Steele Sizer, MD as PCP - General (Family Medicine) Star Age, MD as Consulting Physician (Neurology) Carloyn Manner, MD as Referring Physician (Otolaryngology) Cathi Roan, [facility] (Pharmacist)    Assessment:   This is a routine wellness examination for Maury.  Exercise Activities and Dietary recommendations Current Exercise Habits: The patient does not participate in regular exercise at present, Exercise limited by: orthopedic condition(s);respiratory conditions(s)  Goals    . Exercise 150 min/wk Moderate Activity     Recommend to exercise at least 150 minutes per week.        Fall Risk Fall Risk  05/26/2019 05/12/2019 04/28/2019 12/15/2018 06/29/2018  Falls in the past year? 0 0 0 0 0  Number falls in past yr: 0 0 0 0 0  Injury with Fall? 0 0 0 0 0  Risk for fall due to : No Fall Risks - - - -  Follow up Falls prevention discussed - - - -   FALL RISK PREVENTION PERTAINING TO THE HOME:  Any stairs in or around the home? Yes  If so, do they handrails? No  - a few steps outside  Home free of loose throw rugs in walkways, pet beds, electrical cords, etc? Yes  Adequate lighting in your home to reduce risk of falls? Yes   ASSISTIVE DEVICES UTILIZED TO PREVENT FALLS:  Life alert? No  Use of a cane, walker or w/c? No  Grab bars in the bathroom? No Shower chair or bench in  shower? Yes    Elevated toilet seat or a handicapped toilet? No   DME ORDERS:  DME order needed?  Yes  - nebulizer   TIMED UP AND GO:  Was the test performed? Yes .  Length of time to ambulate 10 feet: 6 sec.   GAIT:  Appearance of gait: Gait stead-fast and without the use of an assistive device.   Education: Fall risk prevention has been discussed.  Intervention(s) required? No   Depression Screen PHQ 2/9 Scores 05/26/2019 05/12/2019 04/28/2019 12/15/2018  PHQ - 2 Score 0 0 0 0  PHQ- 9 Score - 0 0 0     Cognitive Function     6CIT Screen 05/26/2019 04/22/2018 04/21/2017  What Year? 0 points 0 points 0 points  What month? 0 points 0 points 0 points  What time? 0 points 0 points 0 points  Count back from 20 0 points 2 points 0 points  Months in reverse 4 points 4 points 4 points  Repeat phrase 2 points 2 points 6 points  Total Score _0 Immunization History  Administered Date(s) Administered  . Fluad Quad(high Dose 65+) 02/22/2019, 05/02/2019  . Meningococcal Conjugate 12/10/2006  . Pneumococcal Polysaccharide-23 06/29/2018, 05/02/2019  . Td 12/10/2006    Qualifies for Shingles Vaccine? Yes . Due for Shingrix. Education has been provided regarding the importance of this vaccine. Pt has been advised to call insurance company to determine out of pocket expense. Advised may also receive vaccine at local pharmacy or Health Dept. Verbalized acceptance and understanding.  Tdap: Although this vaccine is not a covered service during a Wellness Exam, does the patient still wish to receive this vaccine today?  No .  Education has been provided regarding the importance of this vaccine. Advised may receive this vaccine at local pharmacy or Health Dept. Aware to provide a copy of the vaccination record if obtained from local pharmacy or Health Dept. Verbalized acceptance and understanding.  Flu Vaccine: Up to date  Pneumococcal Vaccine: Up to date   Screening Tests Health Maintenance  Topic  Date Due  . OPHTHALMOLOGY EXAM  08/12/2018  . FOOT EXAM  03/10/2019  . TETANUS/TDAP  04/27/2020 (Originally 12/09/2016)  . HEMOGLOBIN A1C  08/23/2019  . MAMMOGRAM  11/17/2019  . URINE MICROALBUMIN  02/23/2020  . PNA vac Low Risk Adult (2 of 2 - PCV13) 05/01/2020  . COLONOSCOPY  11/05/2021  . INFLUENZA VACCINE  Completed  . DEXA SCAN  Completed  . Hepatitis C Screening  Completed    Cancer Screenings:  Colorectal Screening: Completed 11/05/16. Repeat every 5 years;   Mammogram: Completed 11/17/18. Repeat every year.  Bone Density: Completed 11/17/18. Results reflect NORMAL. Repeat every 2 years.   Lung Cancer Screening: (Low Dose CT Chest recommended if Age 81-80 years, 30 pack-year currently smoking OR have quit w/in 15years.) does not qualify.   Additional Screening:  Hepatitis C Screening: does qualify; Completed 01/08/16  Vision Screening: Recommended annual ophthalmology exams for early detection of glaucoma and other disorders of the eye. Is the patient up to date with their annual eye exam?  Yes  Who is the provider or what is the name of the office in which the pt attends annual eye exams? Dr. Gloriann Loan  Dental Screening: Recommended annual dental exams for proper oral hygiene  Community Resource Referral:  CRR required this visit?  No      Plan:     I have personally reviewed and addressed the Medicare  Annual Wellness questionnaire and have noted the following in the patient's chart:  A. Medical and social history B. Use of alcohol, tobacco or illicit drugs  C. Current medications and supplements D. Functional ability and status E.  Nutritional status F.  Physical activity G. Advance directives H. List of other physicians I.  Hospitalizations, surgeries, and ER visits in previous 12 months J.  [place] such as hearing and vision if needed, cognitive and depression L. Referrals and appointments   In addition, I have reviewed and discussed with patient  certain preventive protocols, quality metrics, and best practice recommendations. A written personalized care plan for preventive services as well as general preventive health recommendations were provided to patient.   Signed,   Clemetine Marker, LPN Nurse Health Advisor     Nurse Notes: pt states she received albuterol nebulizer inhalation solution from [facility] but pt states she does not have a nebulizer machine. Pt will need rx for nebulizer sent to [facility] in order to use nebulizer treatments. Pt has COPD and experiences episodes of SOB at times and uses her Anoro and albuterol inhalers daily. Please advise patient if she needs to use these in addition to albuterol inhaler.

## 2019-05-31 DIAGNOSIS — J449 Chronic obstructive pulmonary disease, unspecified: Secondary | ICD-10-CM | POA: Diagnosis not present

## 2019-05-31 DIAGNOSIS — G4733 Obstructive sleep apnea (adult) (pediatric): Secondary | ICD-10-CM | POA: Diagnosis not present

## 2019-06-10 DIAGNOSIS — J331 Polypoid sinus degeneration: Secondary | ICD-10-CM | POA: Diagnosis not present

## 2019-06-10 DIAGNOSIS — J324 Chronic pansinusitis: Secondary | ICD-10-CM | POA: Diagnosis not present

## 2019-06-28 DIAGNOSIS — G4733 Obstructive sleep apnea (adult) (pediatric): Secondary | ICD-10-CM | POA: Diagnosis not present

## 2019-06-28 DIAGNOSIS — J449 Chronic obstructive pulmonary disease, unspecified: Secondary | ICD-10-CM | POA: Diagnosis not present

## 2019-06-29 ENCOUNTER — Ambulatory Visit: Payer: Medicare HMO | Admitting: Family Medicine

## 2019-07-14 ENCOUNTER — Ambulatory Visit (INDEPENDENT_AMBULATORY_CARE_PROVIDER_SITE_OTHER): Payer: Medicare HMO | Admitting: Family Medicine

## 2019-07-14 ENCOUNTER — Encounter: Payer: Self-pay | Admitting: Family Medicine

## 2019-07-14 ENCOUNTER — Other Ambulatory Visit: Payer: Self-pay

## 2019-07-14 VITALS — BP 140/80 | HR 95 | Temp 96.8°F | Resp 16 | Ht 65.0 in | Wt 260.1 lb

## 2019-07-14 DIAGNOSIS — Z8616 Personal history of COVID-19: Secondary | ICD-10-CM | POA: Insufficient documentation

## 2019-07-14 DIAGNOSIS — I10 Essential (primary) hypertension: Secondary | ICD-10-CM

## 2019-07-14 DIAGNOSIS — J302 Other seasonal allergic rhinitis: Secondary | ICD-10-CM

## 2019-07-14 DIAGNOSIS — K219 Gastro-esophageal reflux disease without esophagitis: Secondary | ICD-10-CM | POA: Diagnosis not present

## 2019-07-14 DIAGNOSIS — E669 Obesity, unspecified: Secondary | ICD-10-CM

## 2019-07-14 DIAGNOSIS — G4733 Obstructive sleep apnea (adult) (pediatric): Secondary | ICD-10-CM | POA: Diagnosis not present

## 2019-07-14 DIAGNOSIS — M170 Bilateral primary osteoarthritis of knee: Secondary | ICD-10-CM | POA: Diagnosis not present

## 2019-07-14 DIAGNOSIS — E1169 Type 2 diabetes mellitus with other specified complication: Secondary | ICD-10-CM | POA: Diagnosis not present

## 2019-07-14 DIAGNOSIS — E785 Hyperlipidemia, unspecified: Secondary | ICD-10-CM

## 2019-07-14 DIAGNOSIS — Z79899 Other long term (current) drug therapy: Secondary | ICD-10-CM

## 2019-07-14 DIAGNOSIS — J411 Mucopurulent chronic bronchitis: Secondary | ICD-10-CM | POA: Diagnosis not present

## 2019-07-14 DIAGNOSIS — J3089 Other allergic rhinitis: Secondary | ICD-10-CM

## 2019-07-14 MED ORDER — METFORMIN HCL ER 750 MG PO TB24: 750.0000 mg | ORAL_TABLET | Freq: Every day | ORAL | 1 refills | Status: DC

## 2019-07-14 MED ORDER — ANORO ELLIPTA 62.5-25 MCG/INH IN AEPB: 1.0000 | INHALATION_SPRAY | Freq: Every day | RESPIRATORY_TRACT | 1 refills | Status: DC

## 2019-07-14 MED ORDER — LORATADINE 10 MG PO TABS: 10.0000 mg | ORAL_TABLET | Freq: Every day | ORAL | 1 refills | Status: DC

## 2019-07-14 MED ORDER — TRIAMTERENE-HCTZ 37.5-25 MG PO CAPS: 1.0000 | ORAL_CAPSULE | Freq: Every day | ORAL | 1 refills | Status: DC

## 2019-07-14 MED ORDER — MONTELUKAST SODIUM 10 MG PO TABS: 10.0000 mg | ORAL_TABLET | Freq: Every day | ORAL | 1 refills | Status: DC

## 2019-07-14 MED ORDER — FLUTICASONE PROPIONATE 50 MCG/ACT NA SUSP: 2.0000 | Freq: Every day | NASAL | 1 refills | Status: DC

## 2019-07-14 MED ORDER — ALBUTEROL SULFATE HFA 108 (90 BASE) MCG/ACT IN AERS: 1.0000 | INHALATION_SPRAY | RESPIRATORY_TRACT | 1 refills | Status: DC | PRN

## 2019-07-14 MED ORDER — OMEPRAZOLE 20 MG PO CPDR: 20.0000 mg | DELAYED_RELEASE_CAPSULE | Freq: Every day | ORAL | 1 refills | Status: DC

## 2019-07-14 NOTE — Progress Notes (Signed)
Name: Alexandra Foster   MRN: [id number]    DOB: 01-09-1951   Date:07/14/2019       Progress Note  Subjective  Chief Complaint  Chief Complaint  Patient presents with  . Diabetes  . Hypertension  . Gastroesophageal Reflux  . COPD    HPI  HTN:she is compliant with medication, no proteinuria and is not on ARB or ACE.No chest pain, palpitation dizziness.   Dyslipidemia and carotid artery stenosis:she is taking statin therapy and aspirin 81 mg daily . No myalgias, we will recheck labs today   COPD:she is doing well, last flare was 04/2018 and had to take a Zpack, also had COVID-19 pneumonia 04/2019, she states back to baseline. She denies wheezing or cough, she would like to resume Anoro because it helps her breath better.   Morbid obesity: she has been obese since reconstructive sinus surgery 12years ago she had to take steroids and gained weight. . She has co-morbidities, knee OA, HTN, dyslipidemia, DM. She has not been physically active but has only gained 4 lbs since last year.   DM: she had a hgbA1C of 6.6%January 2019, down to 6.1% January 2020, she denies polyphagia, polydipsia but she has nocturia.She likes sweets, likes to eat and still drinks  Sodas. She states glucose at home has been controlled 115-134. She states glucose went up last night after she ate two slices of red velvet cake. She went to one class of diabetic teaching class last year Denies hypoglycemic episodes   GERD: discussed risk associated with long term PPI, she is doing well on Omeprazole, no heartburn or indigestion as long as she takes medication. Unchanged   OSA: sees neurologist every 6 months , very compliant with CPAP machine, she wakes feeling rested, no longer snoring at night. Echo showed enlarged right atrium before starting on CPAP. Lower extremity edema is intermittent , doing better since compliant with CPAP   OAboth knees:no instability, pain is aching like and only when walking,  no effusion or redness. Currently no pain, she asked about steroid injections and advised to wait until symptomatic   Carotid artery disease: doppler done 2015, repeat in 2020 and it was negative for plaque, seen by Dr. Erven Colla last year   Patient Active Problem List   Diagnosis Date Noted  . Personal history of covid-19 07/14/2019  . Bilateral lower extremity edema 05/03/2018  . Lower extremity pain, bilateral 05/03/2018  . Diabetes mellitus type 2 in obese ([place]) 04/23/2018  . Osteoarthritis of both knees 01/14/2018  . Urge incontinence 03/14/2016  . Osteopenia 02/13/2016  . OSA (obstructive sleep apnea) 01/08/2016  . Allergic rhinitis 04/13/2015  . COPD (chronic obstructive pulmonary disease) ([place]) 09/18/2014  . Morbid obesity ([place]) 09/18/2014  . Esophagitis, reflux 09/18/2014  . Essential hypertension 09/21/2006    Past Surgical History:  Procedure Laterality Date  . ABDOMINAL HYSTERECTOMY    . COLONOSCOPY  10/29/2006   Dr Allen Norris  . COLONOSCOPY WITH PROPOFOL N/A 11/05/2016   Procedure: COLONOSCOPY WITH PROPOFOL;  Surgeon: Robert Bellow, MD;  Location: [facility] ENDOSCOPY;  Service: Endoscopy;  Laterality: N/A;  . NASAL SINUS SURGERY  2002   Dr Carlis Abbott    Family History  Problem Relation Age of Onset  . Congestive Heart Failure Mother   . Coronary artery disease Father   . Breast cancer Sister 25  . Heart disease Brother   . Varicose Veins Brother   . Alcohol abuse Brother     Social History   Tobacco  Use  . Smoking status: Former Smoker    Packs/day: 2.00    Years: 40.00    Pack years: 80.00    Types: Cigarettes    Quit date: 2008    Years since quitting: 13.2  . Smokeless tobacco: Former Systems developer    Types: Snuff    Quit date: 04/2001  . Tobacco comment: smoking cessation materials not required  Substance Use Topics  . Alcohol use: No    Alcohol/week: 0.0 standard drinks     Current Outpatient Medications:  .  albuterol (PROVENTIL) (2.5 MG/3ML) 0.083%  nebulizer solution, Take 3 mLs (2.5 mg total) by nebulization every 6 (six) hours as needed for wheezing or shortness of breath., Disp: 150 mL, Rfl: 1 .  albuterol (VENTOLIN HFA) 108 (90 Base) MCG/ACT inhaler, Inhale 1-2 puffs into the lungs every 4 (four) hours as needed for wheezing or shortness of breath., Disp: 54 g, Rfl: 1 .  aspirin 81 MG tablet, Take 1 tablet (81 mg total) by mouth daily., Disp: 30 tablet, Rfl: 0 .  atorvastatin (LIPITOR) 40 MG tablet, TAKE 1 TABLET EVERY DAY, Disp: 90 tablet, Rfl: 1 .  Blood Glucose Calibration (ACCU-CHEK AVIVA) SOLN, 1 each by In Vitro route daily., Disp: 1 each, Rfl: 0 .  Blood Glucose Monitoring Suppl (ACCU-CHEK AVIVA PLUS) w/Device KIT, USE AS DIRECTED, Disp: 1 kit, Rfl: 0 .  calcium carbonate (OSCAL) 1500 (600 Ca) MG TABS tablet, Take 600 mg of elemental calcium by mouth 2 (two) times daily with a meal., Disp: , Rfl:  .  docusate sodium (COLACE) 100 MG capsule, Take 100 mg by mouth daily., Disp: , Rfl:  .  glucose blood (ACCU-CHEK AVIVA PLUS) test strip, Use as instructed, Disp: 100 each, Rfl: 12 .  Lancets (ACCU-CHEK SOFT TOUCH) lancets, Use as instructed, Disp: 100 each, Rfl: 12 .  metFORMIN (GLUCOPHAGE-XR) 750 MG 24 hr tablet, Take 1 tablet (750 mg total) by mouth daily with breakfast., Disp: 90 tablet, Rfl: 1 .  montelukast (SINGULAIR) 10 MG tablet, Take 1 tablet (10 mg total) by mouth at bedtime., Disp: 90 tablet, Rfl: 1 .  Multiple Vitamins-Minerals (MULTIVITAL PO), Take 1 tablet by mouth daily., Disp: , Rfl:  .  omeprazole (PRILOSEC) 20 MG capsule, Take 1 capsule (20 mg total) by mouth daily., Disp: 90 capsule, Rfl: 1 .  triamterene-hydrochlorothiazide (DYAZIDE) 37.5-25 MG capsule, Take 1 each (1 capsule total) by mouth daily., Disp: 90 capsule, Rfl: 1 .  umeclidinium-vilanterol (ANORO ELLIPTA) 62.5-25 MCG/INH AEPB, Inhale 1 puff into the lungs daily., Disp: 180 each, Rfl: 1 .  vitamin B-12 (CYANOCOBALAMIN) 100 MCG tablet, Take 100 mcg by mouth  daily., Disp: , Rfl:  .  fluticasone (FLONASE) 50 MCG/ACT nasal spray, Place 2 sprays into both nostrils daily., Disp: 48 g, Rfl: 1 .  loratadine (CLARITIN) 10 MG tablet, Take 1 tablet (10 mg total) by mouth daily., Disp: 90 tablet, Rfl: 1  No Known Allergies  I personally reviewed active problem list, medication list, allergies, family history, social history, health maintenance with the patient/caregiver today.   ROS  Constitutional: Negative for fever or weight change.  Respiratory: Negative for cough , some  shortness of breath with activity .   Cardiovascular: Negative for chest pain or palpitations.  Gastrointestinal: Negative for abdominal pain, no bowel changes.  Musculoskeletal: Negative for gait problem or joint swelling.  Skin: Negative for rash.  Neurological: Negative for dizziness or headache.  No other specific complaints in a complete review of systems (except  as listed in HPI above).   Objective  Vitals:   07/14/19 0905  BP: 140/80  Pulse: 95  Resp: 16  Temp: (!) 96.8 F (36 C)  TempSrc: Temporal  SpO2: 95%  Weight: 260 lb 1.6 oz (118 kg)  Height: '5\' 5"'  (1.651 m)    Body mass index is 43.28 kg/m.  Physical Exam  Constitutional: Patient appears well-developed and well-nourished. Obese  No distress.  HEENT: head atraumatic, normocephalic, pupils equal and reactive to light Cardiovascular: Normal rate, regular rhythm and normal heart sounds.  No murmur heard. No BLE edema. Pulmonary/Chest: Effort normal and breath sounds normal. No respiratory distress. Abdominal: Soft.  There is no tenderness. Psychiatric: Patient has a normal mood and affect. behavior is normal. Judgment and thought content normal.  Recent Results (from the past 2160 hour(s))  CBC with Differential     Status: Abnormal   Collection Time: 04/29/19  1:51 PM  Result Value Ref Range   WBC 9.4 4.0 - 10.5 K/uL   RBC 4.82 3.87 - 5.11 MIL/uL   Hemoglobin 11.7 (L) 12.0 - 15.0 g/dL   HCT  38.3 36.0 - 46.0 %   MCV 79.5 (L) 80.0 - 100.0 fL   MCH 24.3 (L) 26.0 - 34.0 pg   MCHC 30.5 30.0 - 36.0 g/dL   RDW 13.8 11.5 - 15.5 %   Platelets 440 (H) 150 - 400 K/uL   nRBC 0.3 (H) 0.0 - 0.2 %   Neutrophils Relative % 83 %   Neutro Abs 7.8 (H) 1.7 - 7.7 K/uL   Lymphocytes Relative 7 %   Lymphs Abs 0.7 0.7 - 4.0 K/uL   Monocytes Relative 2 %   Monocytes Absolute 0.2 0.1 - 1.0 K/uL   Eosinophils Relative 6 %   Eosinophils Absolute 0.6 (H) 0.0 - 0.5 K/uL   Basophils Relative 0 %   Basophils Absolute 0.0 0.0 - 0.1 K/uL   Immature Granulocytes 2 %   Abs Immature Granulocytes 0.14 (H) 0.00 - 0.07 K/uL    Comment: Performed at [facility], [address] [place], [place] [address]  Comprehensive metabolic panel     Status: Abnormal   Collection Time: 04/29/19  1:51 PM  Result Value Ref Range   Sodium 141 135 - 145 mmol/L   Potassium 3.1 (L) 3.5 - 5.1 mmol/L   Chloride 101 98 - 111 mmol/L   CO2 27 22 - 32 mmol/L   Glucose, Bld 100 (H) 70 - 99 mg/dL   BUN 16 8 - 23 mg/dL   Creatinine, Ser 0.90 0.44 - 1.00 mg/dL   Calcium 8.8 (L) 8.9 - 10.3 mg/dL   Total Protein 7.9 6.5 - 8.1 g/dL   Albumin 2.8 (L) 3.5 - 5.0 g/dL   AST 27 15 - 41 U/L   ALT 21 0 - 44 U/L   Alkaline Phosphatase 63 38 - 126 U/L   Total Bilirubin 0.8 0.3 - 1.2 mg/dL   GFR calc non Af Amer >60 >60 mL/min   GFR calc Af Amer >60 >60 mL/min   Anion gap 13 5 - 15    Comment: Performed at [facility], [place], [place] [address]  Troponin I (High Sensitivity)     Status: None   Collection Time: 04/29/19  7:12 PM  Result Value Ref Range   Troponin I (High Sensitivity) <2 <18 ng/L    Comment: (NOTE) Elevated high sensitivity troponin I (hsTnI) values and significant  changes across serial measurements  may suggest ACS but many other  chronic and acute conditions are known to elevate hsTnI results.  Refer to the "Links" section for chest pain algorithms and additional   guidance. Performed at [facility], [address] [place], [place] [address]   Procalcitonin - Baseline     Status: None   Collection Time: 04/29/19  7:12 PM  Result Value Ref Range   Procalcitonin <0.10 ng/mL    Comment:        Interpretation: PCT (Procalcitonin) <= 0.5 ng/mL: Systemic infection (sepsis) is not likely. Local bacterial infection is possible. (NOTE)       Sepsis PCT Algorithm           Lower Respiratory Tract                                      Infection PCT Algorithm    ----------------------------     ----------------------------         PCT < 0.25 ng/mL                PCT < 0.10 ng/mL         Strongly encourage             Strongly discourage   discontinuation of antibiotics    initiation of antibiotics    ----------------------------     -----------------------------       PCT 0.25 - 0.50 ng/mL            PCT 0.10 - 0.25 ng/mL               OR       >80% decrease in PCT            Discourage initiation of                                            antibiotics      Encourage discontinuation           of antibiotics    ----------------------------     -----------------------------         PCT >= 0.50 ng/mL              PCT 0.26 - 0.50 ng/mL               AND        <80% decrease in PCT             Encourage initiation of                                             antibiotics       Encourage continuation           of antibiotics    ----------------------------     -----------------------------        PCT >= 0.50 ng/mL                  PCT > 0.50 ng/mL               AND         increase in PCT  Strongly encourage                                      initiation of antibiotics    Strongly encourage escalation           of antibiotics                                     -----------------------------                                           PCT <= 0.25 ng/mL                                                 OR                                         > 80% decrease in PCT                                     Discontinue / Do not initiate                                             antibiotics Performed at [facility], [address] [place], [place] [address]   Lactate dehydrogenase     Status: Abnormal   Collection Time: 04/29/19  7:33 PM  Result Value Ref Range   LDH 308 (H) 98 - 192 U/L    Comment: Performed at [facility], [address] [place], [place] [address]  Ferritin     Status: None   Collection Time: 04/29/19  7:33 PM  Result Value Ref Range   Ferritin 136 11 - 307 ng/mL    Comment: Performed at [facility], [address] [place], [place] [address]  Triglycerides     Status: None   Collection Time: 04/29/19  7:33 PM  Result Value Ref Range   Triglycerides 118 <150 mg/dL    Comment: Performed at [facility], [address] [place], [place] [address]  Brain natriuretic peptide     Status: None   Collection Time: 04/29/19  7:33 PM  Result Value Ref Range   B Natriuretic Peptide 25.0 0.0 - 100.0 pg/mL    Comment: Performed at [facility], [address] [place], [place] [address]  POC SARS Coronavirus 2 Ag     Status: Abnormal   Collection Time: 04/29/19  8:11 PM  Result Value Ref Range   SARS Coronavirus 2 Ag POSITIVE (A) NEGATIVE    Comment: (NOTE) SARS-CoV-2 antigen PRESENT. Positive results indicate the presence of viral antigens, but clinical correlation with patient history and other diagnostic information is necessary to determine patient infection status.  Positive results do not rule out bacterial infection or co-infection  with  other viruses. False positive results are rare but can occur, and confirmatory RT-PCR testing may be appropriate in some circumstances. The expected result is Negative. Fact Sheet for Patients: PodPark.tn Fact Sheet for Providers:  GiftContent.is  This test is not yet approved or cleared by the [place] FDA and  has been authorized for detection and/or diagnosis of SARS-CoV-2 by FDA under an Emergency Use Authorization (EUA).  This EUA will remain in effect (meaning this test can be used) for the duration of  the COVID-19 declaration under Section 564(b)(1) of the Act, 21 U.S.C. section 360bbb-3(b)(1), unless the a uthorization is terminated or revoked sooner.   Lactic acid, plasma     Status: None   Collection Time: 04/29/19  8:29 PM  Result Value Ref Range   Lactic Acid, Venous 1.5 0.5 - 1.9 mmol/L    Comment: Performed at [facility], [address] [place], [place] [address]  Fibrin derivatives D-Dimer [facility] only)     Status: Abnormal   Collection Time: 04/29/19  8:29 PM  Result Value Ref Range   Fibrin derivatives D-dimer ([facility]) 3,970.48 (H) 0.00 - 499.00 ng/mL (FEU)    Comment: (NOTE) <> Exclusion of Venous Thromboembolism (VTE) - OUTPATIENT ONLY   (Emergency Department or [place])   0-499 ng/ml (FEU): With a low to intermediate pretest probability                      for VTE this test result excludes the diagnosis                      of VTE.   >499 ng/ml (FEU) : VTE not excluded; additional work up for VTE is                      required. <> Testing on Inpatients and Evaluation of Disseminated Intravascular   Coagulation (DIC) Reference Range:   0-499 ng/ml (FEU) Performed at [facility], [address] [place], [place] [address]   Fibrinogen     Status: Abnormal   Collection Time: 04/29/19  8:29 PM  Result Value Ref Range   Fibrinogen 643 (H) 210 - 475 mg/dL    Comment: Performed at [facility], [address] [place], [place] [address]  C-reactive protein     Status: Abnormal   Collection Time: 04/29/19  8:30 PM  Result Value Ref Range   CRP 8.4 (H) <1.0 mg/dL    Comment: Performed at [facility] [address],  [place], [place] [address]  Troponin I (High Sensitivity)     Status: None   Collection Time: 04/29/19  8:30 PM  Result Value Ref Range   Troponin I (High Sensitivity) <2 <18 ng/L    Comment: (NOTE) Elevated high sensitivity troponin I (hsTnI) values and significant  changes across serial measurements may suggest ACS but many other  chronic and acute conditions are known to elevate hsTnI results.  Refer to the "Links" section for chest pain algorithms and additional  guidance. Performed at [facility], [address] [place], [place] [address]   ABO/Rh     Status: None   Collection Time: 04/29/19  8:30 PM  Result Value Ref Range   ABO/RH(D)      O POS Performed at [facility], [address] [place], [place] [address]   Culture, blood (Routine X 2) w Reflex to ID Panel     Status:  None   Collection Time: 04/29/19 10:01 PM   Specimen: BLOOD  Result Value Ref Range   Specimen Description BLOOD RIGHT ARM    Special Requests      BOTTLES DRAWN AEROBIC AND ANAEROBIC Blood Culture results may not be optimal due to an excessive volume of blood received in culture bottles   Culture      NO GROWTH 5 DAYS Performed at [facility], [address] [place], [place] [address]    Report Status 05/04/2019 FINAL   Culture, blood (Routine X 2) w Reflex to ID Panel     Status: None   Collection Time: 04/29/19 10:01 PM   Specimen: BLOOD  Result Value Ref Range   Specimen Description BLOOD RIGHT FOREARM    Special Requests      BOTTLES DRAWN AEROBIC AND ANAEROBIC Blood Culture adequate volume   Culture      NO GROWTH 5 DAYS Performed at [facility], [address], [place], [place] [address]    Report Status 05/04/2019 FINAL   Lactic acid, plasma     Status: None   Collection Time: 04/29/19 10:02 PM  Result Value Ref Range   Lactic Acid, Venous 1.1 0.5 - 1.9 mmol/L    Comment: Performed at [facility], [address] [place],  [place] [address]  Glucose, capillary     Status: Abnormal   Collection Time: 04/29/19 10:50 PM  Result Value Ref Range   Glucose-Capillary 137 (H) 70 - 99 mg/dL  Glucose, capillary     Status: Abnormal   Collection Time: 04/30/19  5:15 AM  Result Value Ref Range   Glucose-Capillary 160 (H) 70 - 99 mg/dL  Procalcitonin     Status: None   Collection Time: 04/30/19  5:30 AM  Result Value Ref Range   Procalcitonin <0.10 ng/mL    Comment:        Interpretation: PCT (Procalcitonin) <= 0.5 ng/mL: Systemic infection (sepsis) is not likely. Local bacterial infection is possible. (NOTE)       Sepsis PCT Algorithm           Lower Respiratory Tract                                      Infection PCT Algorithm    ----------------------------     ----------------------------         PCT < 0.25 ng/mL                PCT < 0.10 ng/mL         Strongly encourage             Strongly discourage   discontinuation of antibiotics    initiation of antibiotics    ----------------------------     -----------------------------       PCT 0.25 - 0.50 ng/mL            PCT 0.10 - 0.25 ng/mL               OR       >80% decrease in PCT            Discourage initiation of  antibiotics      Encourage discontinuation           of antibiotics    ----------------------------     -----------------------------         PCT >= 0.50 ng/mL              PCT 0.26 - 0.50 ng/mL               AND        <80% decrease in PCT             Encourage initiation of                                             antibiotics       Encourage continuation           of antibiotics    ----------------------------     -----------------------------        PCT >= 0.50 ng/mL                  PCT > 0.50 ng/mL               AND         increase in PCT                  Strongly encourage                                      initiation of antibiotics    Strongly encourage escalation           of antibiotics                                      -----------------------------                                           PCT <= 0.25 ng/mL                                                 OR                                        > 80% decrease in PCT                                     Discontinue / Do not initiate                                             antibiotics Performed at [facility], [address] [place], [place] [address]   CBC with Differential/Platelet     Status: Abnormal   Collection Time: 04/30/19  5:30 AM  Result Value Ref Range   WBC 8.0 4.0 - 10.5 K/uL   RBC 4.63 3.87 - 5.11 MIL/uL   Hemoglobin 11.2 (L) 12.0 - 15.0 g/dL   HCT 37.5 36.0 - 46.0 %   MCV 81.0 80.0 - 100.0 fL   MCH 24.2 (L) 26.0 - 34.0 pg   MCHC 29.9 (L) 30.0 - 36.0 g/dL   RDW 13.9 11.5 - 15.5 %   Platelets 454 (H) 150 - 400 K/uL   nRBC 0.3 (H) 0.0 - 0.2 %   Neutrophils Relative % 88 %   Neutro Abs 7.0 1.7 - 7.7 K/uL   Lymphocytes Relative 8 %   Lymphs Abs 0.6 (L) 0.7 - 4.0 K/uL   Monocytes Relative 2 %   Monocytes Absolute 0.1 0.1 - 1.0 K/uL   Eosinophils Relative 0 %   Eosinophils Absolute 0.0 0.0 - 0.5 K/uL   Basophils Relative 0 %   Basophils Absolute 0.0 0.0 - 0.1 K/uL   Immature Granulocytes 2 %   Abs Immature Granulocytes 0.17 (H) 0.00 - 0.07 K/uL    Comment: Performed at [facility], [address] [place], [place] [address]  Comprehensive metabolic panel     Status: Abnormal   Collection Time: 04/30/19  5:30 AM  Result Value Ref Range   Sodium 143 135 - 145 mmol/L   Potassium 3.8 3.5 - 5.1 mmol/L   Chloride 107 98 - 111 mmol/L   CO2 25 22 - 32 mmol/L   Glucose, Bld 156 (H) 70 - 99 mg/dL   BUN 17 8 - 23 mg/dL   Creatinine, Ser 0.84 0.44 - 1.00 mg/dL   Calcium 8.9 8.9 - 10.3 mg/dL   Total Protein 7.5 6.5 - 8.1 g/dL   Albumin 2.6 (L) 3.5 - 5.0 g/dL   AST 25 15 - 41 U/L   ALT 20 0 - 44 U/L   Alkaline Phosphatase 58 38 - 126 U/L   Total Bilirubin 0.6 0.3 - 1.2 mg/dL    GFR calc non Af Amer >60 >60 mL/min   GFR calc Af Amer >60 >60 mL/min   Anion gap 11 5 - 15    Comment: Performed at [facility], [address] [place], [place] [address]  C-reactive protein     Status: Abnormal   Collection Time: 04/30/19  5:30 AM  Result Value Ref Range   CRP 8.4 (H) <1.0 mg/dL    Comment: Performed at [facility], [address], [place], [place] [address]  Fibrin derivatives D-Dimer [facility] only)     Status: Abnormal   Collection Time: 04/30/19  5:30 AM  Result Value Ref Range   Fibrin derivatives D-dimer ([facility]) 3,386.86 (H) 0.00 - 499.00 ng/mL (FEU)    Comment: (NOTE) <> Exclusion of Venous Thromboembolism (VTE) - OUTPATIENT ONLY   (Emergency Department or [place])   0-499 ng/ml (FEU): With a low to intermediate pretest probability                      for VTE this test result excludes the diagnosis                      of VTE.   >499 ng/ml (FEU) : VTE not excluded; additional work up for VTE is                      required. <> Testing on Inpatients and Evaluation of  Disseminated Intravascular   Coagulation (DIC) Reference Range:   0-499 ng/ml (FEU) Performed at [facility], [address] [place], [place] [address]   Ferritin     Status: None   Collection Time: 04/30/19  5:30 AM  Result Value Ref Range   Ferritin 148 11 - 307 ng/mL    Comment: Performed at [facility], [address] [place], [place] [address]  Glucose, capillary     Status: Abnormal   Collection Time: 04/30/19  7:34 AM  Result Value Ref Range   Glucose-Capillary 119 (H) 70 - 99 mg/dL  Glucose, capillary     Status: Abnormal   Collection Time: 04/30/19 12:44 PM  Result Value Ref Range   Glucose-Capillary 132 (H) 70 - 99 mg/dL  Urinalysis, Routine w reflex microscopic     Status: Abnormal   Collection Time: 04/30/19  3:40 PM  Result Value Ref Range   Color, Urine YELLOW (A) YELLOW   APPearance CLEAR (A) CLEAR   Specific Gravity, Urine 1.019  1.005 - 1.030   pH 7.0 5.0 - 8.0   Glucose, UA NEGATIVE NEGATIVE mg/dL   Hgb urine dipstick NEGATIVE NEGATIVE   Bilirubin Urine NEGATIVE NEGATIVE   Ketones, ur NEGATIVE NEGATIVE mg/dL   Protein, ur NEGATIVE NEGATIVE mg/dL   Nitrite NEGATIVE NEGATIVE   Leukocytes,Ua NEGATIVE NEGATIVE    Comment: Performed at [facility], [address] [place], [place] [address]  Culture, sputum-assessment     Status: None   Collection Time: 04/30/19  3:40 PM   Specimen: Urine, Clean Catch; Sputum  Result Value Ref Range   Specimen Description EXPECTORATED SPUTUM    Special Requests NONE    Sputum evaluation      Sputum specimen not acceptable for testing.  Please recollect.   Performed at [facility], [address] [place], [place] [address]    Report Status 04/30/2019 FINAL   Glucose, capillary     Status: Abnormal   Collection Time: 04/30/19  7:38 PM  Result Value Ref Range   Glucose-Capillary 108 (H) 70 - 99 mg/dL  Glucose, capillary     Status: Abnormal   Collection Time: 04/30/19 11:38 PM  Result Value Ref Range   Glucose-Capillary 132 (H) 70 - 99 mg/dL  HIV Antibody (routine testing w rflx)     Status: None   Collection Time: 05/01/19  1:15 AM  Result Value Ref Range   HIV Screen 4th Generation wRfx NON REACTIVE NON REACTIVE    Comment: Performed at [facility] [address] [place], [place] [address]  ABO/Rh     Status: None   Collection Time: 05/01/19  1:15 AM  Result Value Ref Range   ABO/RH(D)      O POS Performed at [facility], [place] [address] [place], [place] [address]   CBC with Differential/Platelet     Status: Abnormal   Collection Time: 05/01/19  1:15 AM  Result Value Ref Range   WBC 11.6 (H) 4.0 - 10.5 K/uL   RBC 4.43 3.87 - 5.11 MIL/uL   Hemoglobin 10.7 (L) 12.0 - 15.0 g/dL   HCT 35.8 (L) 36.0 - 46.0 %   MCV 80.8 80.0 - 100.0 fL   MCH 24.2 (L) 26.0 - 34.0 pg   MCHC 29.9 (L) 30.0 - 36.0 g/dL   RDW 14.0 11.5 -  15.5 %   Platelets 488 (H) 150 - 400 K/uL   nRBC 0.2 0.0 - 0.2 %  Neutrophils Relative % 89 %   Neutro Abs 10.2 (H) 1.7 - 7.7 K/uL   Lymphocytes Relative 5 %   Lymphs Abs 0.6 (L) 0.7 - 4.0 K/uL   Monocytes Relative 3 %   Monocytes Absolute 0.4 0.1 - 1.0 K/uL   Eosinophils Relative 1 %   Eosinophils Absolute 0.1 0.0 - 0.5 K/uL   Basophils Relative 0 %   Basophils Absolute 0.0 0.0 - 0.1 K/uL   Immature Granulocytes 2 %   Abs Immature Granulocytes 0.23 (H) 0.00 - 0.07 K/uL    Comment: Performed at [facility], [place] [address], [place], [place] [address]  Comprehensive metabolic panel     Status: Abnormal   Collection Time: 05/01/19  1:15 AM  Result Value Ref Range   Sodium 143 135 - 145 mmol/L   Potassium 3.5 3.5 - 5.1 mmol/L   Chloride 105 98 - 111 mmol/L   CO2 26 22 - 32 mmol/L   Glucose, Bld 130 (H) 70 - 99 mg/dL   BUN 22 8 - 23 mg/dL   Creatinine, Ser 0.81 0.44 - 1.00 mg/dL   Calcium 9.5 8.9 - 10.3 mg/dL   Total Protein 7.0 6.5 - 8.1 g/dL   Albumin 2.5 (L) 3.5 - 5.0 g/dL   AST 19 15 - 41 U/L   ALT 19 0 - 44 U/L   Alkaline Phosphatase 56 38 - 126 U/L   Total Bilirubin 0.4 0.3 - 1.2 mg/dL   GFR calc non Af Amer >60 >60 mL/min   GFR calc Af Amer >60 >60 mL/min   Anion gap 12 5 - 15    Comment: Performed at [facility], [place] [address] [place], [place] [address]  C-reactive protein     Status: Abnormal   Collection Time: 05/01/19  1:15 AM  Result Value Ref Range   CRP 4.4 (H) <1.0 mg/dL    Comment: Performed at [facility], [place] [address] [place], [place] [address]  D-dimer, quantitative (not at [facility])     Status: Abnormal   Collection Time: 05/01/19  1:15 AM  Result Value Ref Range   D-Dimer, Quant 3.09 (H) 0.00 - 0.50 ug/mL-FEU    Comment: (NOTE) At the manufacturer cut-off of 0.50 ug/mL FEU, this assay has been documented to exclude PE with a sensitivity and negative predictive value of 97 to 99%.  At this  time, this assay has not been approved by the FDA to exclude DVT/VTE. Results should be correlated with clinical presentation. Performed at [facility], [place] [address], [place], [place] [address]   Ferritin     Status: None   Collection Time: 05/01/19  1:15 AM  Result Value Ref Range   Ferritin 126 11 - 307 ng/mL    Comment: Performed at [facility], [place] [address] [place], [place] [address]  Magnesium     Status: None   Collection Time: 05/01/19  1:15 AM  Result Value Ref Range   Magnesium 2.0 1.7 - 2.4 mg/dL    Comment: Performed at [facility], [place] [address] [place], [place] [address]  Phosphorus     Status: None   Collection Time: 05/01/19  1:15 AM  Result Value Ref Range   Phosphorus 3.1 2.5 - 4.6 mg/dL    Comment: Performed at [facility], [place] [address], [place], [place] [address]  Glucose, capillary     Status: Abnormal   Collection Time: 05/01/19  4:18 AM  Result Value Ref Range   Glucose-Capillary 157 (H) 70 - 99 mg/dL  Glucose, capillary     Status: Abnormal   Collection Time: 05/01/19  8:01 AM  Result Value Ref Range   Glucose-Capillary 128 (H) 70 - 99 mg/dL  Glucose, capillary     Status: None   Collection Time: 05/01/19  5:31 PM  Result Value Ref Range   Glucose-Capillary 93 70 - 99 mg/dL  Glucose, capillary     Status: None   Collection Time: 05/01/19  7:26 PM  Result Value Ref Range   Glucose-Capillary 98 70 - 99 mg/dL  Glucose, capillary     Status: Abnormal   Collection Time: 05/02/19 12:14 AM  Result Value Ref Range   Glucose-Capillary 115 (H) 70 - 99 mg/dL  Glucose, capillary     Status: Abnormal   Collection Time: 05/02/19  4:54 AM  Result Value Ref Range   Glucose-Capillary 138 (H) 70 - 99 mg/dL  CBC with Differential/Platelet     Status: Abnormal   Collection Time: 05/02/19  5:40 AM  Result Value Ref Range   WBC 9.4 4.0 - 10.5 K/uL   RBC 4.55 3.87 - 5.11 MIL/uL    Hemoglobin 11.0 (L) 12.0 - 15.0 g/dL   HCT 36.6 36.0 - 46.0 %   MCV 80.4 80.0 - 100.0 fL   MCH 24.2 (L) 26.0 - 34.0 pg   MCHC 30.1 30.0 - 36.0 g/dL   RDW 14.1 11.5 - 15.5 %   Platelets 557 (H) 150 - 400 K/uL   nRBC 0.2 0.0 - 0.2 %   Neutrophils Relative % 87 %   Neutro Abs 8.1 (H) 1.7 - 7.7 K/uL   Lymphocytes Relative 6 %   Lymphs Abs 0.6 (L) 0.7 - 4.0 K/uL   Monocytes Relative 4 %   Monocytes Absolute 0.4 0.1 - 1.0 K/uL   Eosinophils Relative 0 %   Eosinophils Absolute 0.0 0.0 - 0.5 K/uL   Basophils Relative 0 %   Basophils Absolute 0.0 0.0 - 0.1 K/uL   Immature Granulocytes 3 %   Abs Immature Granulocytes 0.30 (H) 0.00 - 0.07 K/uL    Comment: Performed at [facility], [place] [address] [place], [place] [address]  BNP daily     Status: None   Collection Time: 05/02/19  5:40 AM  Result Value Ref Range   B Natriuretic Peptide 55.8 0.0 - 100.0 pg/mL    Comment: Performed at [facility], [place] [address], [place], [place] [address]  Comprehensive metabolic panel     Status: Abnormal   Collection Time: 05/02/19  5:40 AM  Result Value Ref Range   Sodium 143 135 - 145 mmol/L   Potassium 4.3 3.5 - 5.1 mmol/L    Comment: DELTA CHECK NOTED NO VISIBLE HEMOLYSIS    Chloride 108 98 - 111 mmol/L   CO2 25 22 - 32 mmol/L   Glucose, Bld 141 (H) 70 - 99 mg/dL   BUN 25 (H) 8 - 23 mg/dL   Creatinine, Ser 0.72 0.44 - 1.00 mg/dL   Calcium 9.4 8.9 - 10.3 mg/dL   Total Protein 6.7 6.5 - 8.1 g/dL   Albumin 2.6 (L) 3.5 - 5.0 g/dL   AST 16 15 - 41 U/L   ALT 17 0 - 44 U/L   Alkaline Phosphatase 53 38 - 126 U/L   Total Bilirubin 0.6 0.3 - 1.2 mg/dL   GFR calc non Af Amer >60 >60 mL/min  GFR calc Af Amer >60 >60 mL/min   Anion gap 10 5 - 15    Comment: Performed at [facility], [place] [address] [place], [place] [address]  C-reactive protein     Status: Abnormal   Collection Time: 05/02/19  5:40 AM  Result Value Ref Range   CRP 1.3 (H)  <1.0 mg/dL    Comment: Performed at [facility], [place] [address] [place], [place] [address]  D-dimer, quantitative (not at [facility])     Status: Abnormal   Collection Time: 05/02/19  5:40 AM  Result Value Ref Range   D-Dimer, Quant 2.02 (H) 0.00 - 0.50 ug/mL-FEU    Comment: (NOTE) At the manufacturer cut-off of 0.50 ug/mL FEU, this assay has been documented to exclude PE with a sensitivity and negative predictive value of 97 to 99%.  At this time, this assay has not been approved by the FDA to exclude DVT/VTE. Results should be correlated with clinical presentation. Performed at [facility], [place] [address] [place], [place] [address]   Magnesium     Status: None   Collection Time: 05/02/19  5:40 AM  Result Value Ref Range   Magnesium 2.1 1.7 - 2.4 mg/dL    Comment: Performed at [facility], [place] [address], [place], [place] [address]  Glucose, capillary     Status: Abnormal   Collection Time: 05/02/19  7:43 AM  Result Value Ref Range   Glucose-Capillary 144 (H) 70 - 99 mg/dL  Glucose, capillary     Status: Abnormal   Collection Time: 05/02/19 12:11 PM  Result Value Ref Range   Glucose-Capillary 115 (H) 70 - 99 mg/dL  Glucose, capillary     Status: Abnormal   Collection Time: 05/02/19  4:14 PM  Result Value Ref Range   Glucose-Capillary 124 (H) 70 - 99 mg/dL  Glucose, capillary     Status: Abnormal   Collection Time: 05/02/19  7:33 PM  Result Value Ref Range   Glucose-Capillary 110 (H) 70 - 99 mg/dL  Glucose, capillary     Status: Abnormal   Collection Time: 05/02/19 11:52 PM  Result Value Ref Range   Glucose-Capillary 121 (H) 70 - 99 mg/dL  CBC with Differential/Platelet     Status: Abnormal   Collection Time: 05/03/19  1:50 AM  Result Value Ref Range   WBC 8.9 4.0 - 10.5 K/uL   RBC 4.52 3.87 - 5.11 MIL/uL   Hemoglobin 10.9 (L) 12.0 - 15.0 g/dL   HCT 36.2 36.0 - 46.0 %   MCV 80.1 80.0 - 100.0 fL   MCH 24.1 (L) 26.0  - 34.0 pg   MCHC 30.1 30.0 - 36.0 g/dL   RDW 14.1 11.5 - 15.5 %   Platelets 583 (H) 150 - 400 K/uL   nRBC 0.2 0.0 - 0.2 %   Neutrophils Relative % 77 %   Neutro Abs 7.0 1.7 - 7.7 K/uL   Lymphocytes Relative 11 %   Lymphs Abs 0.9 0.7 - 4.0 K/uL   Monocytes Relative 6 %   Monocytes Absolute 0.5 0.1 - 1.0 K/uL   Eosinophils Relative 0 %   Eosinophils Absolute 0.0 0.0 - 0.5 K/uL   Basophils Relative 1 %   Basophils Absolute 0.1 0.0 - 0.1 K/uL   Immature Granulocytes 5 %   Abs Immature Granulocytes 0.40 (H) 0.00 - 0.07 K/uL    Comment: Performed at [facility], [place] [address], [place], [place] [address]  BNP daily     Status: None   Collection Time: 05/03/19  1:50 AM  Result Value Ref Range   B Natriuretic Peptide 66.7 0.0 - 100.0 pg/mL    Comment: Performed at [facility], [place] [address], [place], [place] [address]  Comprehensive metabolic panel     Status: Abnormal   Collection Time: 05/03/19  1:50 AM  Result Value Ref Range   Sodium 142 135 - 145 mmol/L   Potassium 3.8 3.5 - 5.1 mmol/L   Chloride 107 98 - 111 mmol/L   CO2 27 22 - 32 mmol/L   Glucose, Bld 125 (H) 70 - 99 mg/dL   BUN 25 (H) 8 - 23 mg/dL   Creatinine, Ser 0.74 0.44 - 1.00 mg/dL   Calcium 9.7 8.9 - 10.3 mg/dL   Total Protein 6.2 (L) 6.5 - 8.1 g/dL   Albumin 2.4 (L) 3.5 - 5.0 g/dL   AST 18 15 - 41 U/L   ALT 17 0 - 44 U/L   Alkaline Phosphatase 50 38 - 126 U/L   Total Bilirubin 0.3 0.3 - 1.2 mg/dL   GFR calc non Af Amer >60 >60 mL/min   GFR calc Af Amer >60 >60 mL/min   Anion gap 8 5 - 15    Comment: Performed at [facility], [place] [address], [place], [place] [address]  C-reactive protein     Status: None   Collection Time: 05/03/19  1:50 AM  Result Value Ref Range   CRP 0.9 <1.0 mg/dL    Comment: Performed at [facility], [place] [address], [place], [place] [address]  D-dimer, quantitative (not at [facility])     Status: Abnormal    Collection Time: 05/03/19  1:50 AM  Result Value Ref Range   D-Dimer, Quant 2.00 (H) 0.00 - 0.50 ug/mL-FEU    Comment: (NOTE) At the manufacturer cut-off of 0.50 ug/mL FEU, this assay has been documented to exclude PE with a sensitivity and negative predictive value of 97 to 99%.  At this time, this assay has not been approved by the FDA to exclude DVT/VTE. Results should be correlated with clinical presentation. Performed at [facility], [place] [address] [place], [place] [address]   Magnesium     Status: None   Collection Time: 05/03/19  1:50 AM  Result Value Ref Range   Magnesium 2.0 1.7 - 2.4 mg/dL    Comment: Performed at [facility], [place] [address] [place], [place] [address]  Glucose, capillary     Status: Abnormal   Collection Time: 05/03/19  3:30 AM  Result Value Ref Range   Glucose-Capillary 144 (H) 70 - 99 mg/dL  Glucose, capillary     Status: None   Collection Time: 05/03/19  8:18 AM  Result Value Ref Range   Glucose-Capillary 93 70 - 99 mg/dL  Glucose, capillary     Status: None   Collection Time: 05/03/19 11:51 AM  Result Value Ref Range   Glucose-Capillary 92 70 - 99 mg/dL  CBC     Status: Abnormal   Collection Time: 05/17/19 11:02 AM  Result Value Ref Range   WBC 7.2 4.0 - 10.5 K/uL   RBC 4.94 3.87 - 5.11 MIL/uL   Hemoglobin 12.2 12.0 - 15.0 g/dL   HCT 40.5 36.0 - 46.0 %   MCV 82.0 80.0 - 100.0 fL   MCH 24.7 (L) 26.0 - 34.0 pg   MCHC 30.1 30.0 - 36.0 g/dL  RDW 15.5 11.5 - 15.5 %   Platelets 323 150 - 400 K/uL   nRBC 0.0 0.0 - 0.2 %    Comment: Performed at [facility], [place] [place], [place] [address]  Comprehensive metabolic panel     Status: Abnormal   Collection Time: 05/17/19 11:02 AM  Result Value Ref Range   Sodium 143 135 - 145 mmol/L   Potassium 3.6 3.5 - 5.1 mmol/L   Chloride 103 98 - 111 mmol/L   CO2 28 22 - 32 mmol/L   Glucose, Bld 108 (H) 70 - 99 mg/dL   BUN 16 8 - 23 mg/dL    Creatinine, Ser 0.76 0.44 - 1.00 mg/dL   Calcium 9.2 8.9 - 10.3 mg/dL   Total Protein 7.3 6.5 - 8.1 g/dL   Albumin 3.2 (L) 3.5 - 5.0 g/dL   AST 19 15 - 41 U/L   ALT 14 0 - 44 U/L   Alkaline Phosphatase 69 38 - 126 U/L   Total Bilirubin 0.7 0.3 - 1.2 mg/dL   GFR calc non Af Amer >60 >60 mL/min   GFR calc Af Amer >60 >60 mL/min   Anion gap 12 5 - 15    Comment: Performed at [facility], [address] [place], [place] [address]    Diabetic Foot Exam: Diabetic Foot Exam - Simple   Simple Foot Form Diabetic Foot exam was performed with the following findings: Yes 07/14/2019  9:35 AM  Visual Inspection See comments: Yes Sensation Testing Intact to touch and monofilament testing bilaterally: Yes Pulse Check Posterior Tibialis and Dorsalis pulse intact bilaterally: Yes Comments Cracked heels, thick toenails      PHQ2/9: Depression screen [facility] 2/9 07/14/2019 05/26/2019 05/12/2019 04/28/2019 12/15/2018  Decreased Interest 0 0 0 0 0  Down, Depressed, Hopeless 0 0 0 0 0  PHQ - 2 Score 0 0 0 0 0  Altered sleeping 0 - 0 0 0  Tired, decreased energy 0 - 0 0 0  Change in appetite 0 - 0 0 0  Feeling bad or failure about yourself  0 - 0 0 0  Trouble concentrating 0 - 0 0 0  Moving slowly or fidgety/restless 0 - 0 0 0  Suicidal thoughts 0 - 0 0 0  PHQ-9 Score 0 - 0 0 0  Difficult doing work/chores - - - Not difficult at all Not difficult at all  Some recent data might be hidden    phq 9 is negative   Fall Risk: Fall Risk  07/14/2019 05/26/2019 05/12/2019 04/28/2019 12/15/2018  Falls in the past year? 0 0 0 0 0  Number falls in past yr: 0 0 0 0 0  Injury with Fall? 0 0 0 0 0  Risk for fall due to : - No Fall Risks - - -  Follow up - Falls prevention discussed - - -     Assessment & Plan  1. Diabetes mellitus type 2 in obese (HCC)  - metFORMIN (GLUCOPHAGE-XR) 750 MG 24 hr tablet; Take 1 tablet (750 mg total) by mouth daily with breakfast.  Dispense: 90 tablet; Refill: 1 -  Hemoglobin A1c  2. Mucopurulent chronic bronchitis ([place])  - umeclidinium-vilanterol (ANORO ELLIPTA) 62.5-25 MCG/INH AEPB; Inhale 1 puff into the lungs daily.  Dispense: 180 each; Refill: 1 - albuterol (VENTOLIN HFA) 108 (90 Base) MCG/ACT inhaler; Inhale 1-2 puffs into the lungs every 4 (four) hours as needed for wheezing or shortness of breath.  Dispense: 54 g; Refill:  1  3. OSA (obstructive sleep apnea)  Continue CPAP machine use  4. Dyslipidemia associated with type 2 diabetes mellitus ([place])  - Lipid panel  5. Gastroesophageal reflux disease without esophagitis  - omeprazole (PRILOSEC) 20 MG capsule; Take 1 capsule (20 mg total) by mouth daily.  Dispense: 90 capsule; Refill: 1  6. Primary osteoarthritis of both knees   7. Morbid obesity ([place])  Discussed low carb diet  8. Dyslipidemia  - Lipid panel  9. Personal history of covid-19   10. Essential hypertension  - triamterene-hydrochlorothiazide (DYAZIDE) 37.5-25 MG capsule; Take 1 each (1 capsule total) by mouth daily.  Dispense: 90 capsule; Refill: 1 - COMPLETE METABOLIC PANEL WITH GFR  11. Perennial allergic rhinitis with seasonal variation  - montelukast (SINGULAIR) 10 MG tablet; Take 1 tablet (10 mg total) by mouth at bedtime.  Dispense: 90 tablet; Refill: 1 - loratadine (CLARITIN) 10 MG tablet; Take 1 tablet (10 mg total) by mouth daily.  Dispense: 90 tablet; Refill: 1 - fluticasone (FLONASE) 50 MCG/ACT nasal spray; Place 2 sprays into both nostrils daily.  Dispense: 48 g; Refill: 1  12. Long-term use of high-risk medication  - Vitamin B12

## 2019-07-15 LAB — LIPID PANEL
Cholesterol: 105 mg/dL (ref <200)
HDL: 34 mg/dL — ABNORMAL LOW (ref >=50)
LDL Cholesterol (Calc): 52 mg/dL (calc)
Non-HDL Cholesterol (Calc): 71 mg/dL (calc) (ref <130)
Total CHOL/HDL Ratio: 3.1 (calc) (ref <5.0)
Triglycerides: 100 mg/dL (ref <150)

## 2019-07-15 LAB — HEMOGLOBIN A1C
Hgb A1c MFr Bld: 6.3 % of total Hgb — ABNORMAL HIGH (ref <5.7)
Mean Plasma Glucose: 134 (calc)
eAG (mmol/L): 7.4 (calc)

## 2019-07-15 LAB — COMPLETE METABOLIC PANEL WITH GFR
AG Ratio: 1.3 (calc) (ref 1.0–2.5)
ALT: 25 U/L (ref 6–29)
AST: 21 U/L (ref 10–35)
Albumin: 3.8 g/dL (ref 3.6–5.1)
Alkaline phosphatase (APISO): 86 U/L (ref 37–153)
BUN/Creatinine Ratio: 21 (calc) (ref 6–22)
BUN: 20 mg/dL (ref 7–25)
CO2: 32 mmol/L (ref 20–32)
Calcium: 9.8 mg/dL (ref 8.6–10.4)
Chloride: 103 mmol/L (ref 98–110)
Creat: 0.94 mg/dL — ABNORMAL HIGH (ref 0.60–0.93)
GFR, Est African American: 71 mL/min/{1.73_m2} (ref >=60)
GFR, Est Non African American: 61 mL/min/{1.73_m2} (ref >=60)
Globulin: 2.9 g/dL (calc) (ref 1.9–3.7)
Glucose, Bld: 114 mg/dL — ABNORMAL HIGH (ref 65–99)
Potassium: 4.2 mmol/L (ref 3.5–5.3)
Sodium: 145 mmol/L (ref 135–146)
Total Bilirubin: 0.6 mg/dL (ref 0.2–1.2)
Total Protein: 6.7 g/dL (ref 6.1–8.1)

## 2019-07-15 LAB — VITAMIN B12: Vitamin B-12: >2000 pg/mL — ABNORMAL HIGH (ref 200–1100)

## 2019-07-18 ENCOUNTER — Other Ambulatory Visit: Payer: Self-pay | Admitting: Family Medicine

## 2019-07-18 MED ORDER — STIOLTO RESPIMAT 2.5-2.5 MCG/ACT IN AERS: 2.0000 | INHALATION_SPRAY | Freq: Every day | RESPIRATORY_TRACT | 0 refills | Status: DC

## 2019-07-19 ENCOUNTER — Telehealth: Payer: Self-pay | Admitting: Family Medicine

## 2019-07-19 MED ORDER — STIOLTO RESPIMAT 2.5-2.5 MCG/ACT IN AERS: 2.0000 | INHALATION_SPRAY | Freq: Every day | RESPIRATORY_TRACT | 0 refills | Status: DC

## 2019-07-19 NOTE — Telephone Encounter (Signed)
Tiotropium Bromide-Olodaterol (STIOLTO RESPIMAT) 2.5-2.5 MCG/ACT AERS     Patient's pharmacy calling to request clarification on this medication. They state that it was sent with no directions.

## 2019-07-20 ENCOUNTER — Telehealth: Payer: Self-pay | Admitting: Family Medicine

## 2019-07-20 NOTE — Telephone Encounter (Signed)
Copied from [place] [id number]. Topic: General - Other >> Jul 20, 2019  4:00 PM Celene Kras wrote: Reason for CRM: Pt calling stating that she missed the call for her lab results. Pt is requesting a call back. Please advise.     Returned patient's call to go over lab results per the result note from Dr. Ancil Boozer. Patient verbalized understanding.

## 2019-07-29 DIAGNOSIS — G4733 Obstructive sleep apnea (adult) (pediatric): Secondary | ICD-10-CM | POA: Diagnosis not present

## 2019-07-29 DIAGNOSIS — J449 Chronic obstructive pulmonary disease, unspecified: Secondary | ICD-10-CM | POA: Diagnosis not present

## 2019-08-11 ENCOUNTER — Telehealth: Payer: Self-pay | Admitting: Family Medicine

## 2019-08-11 NOTE — Chronic Care Management (AMB) (Signed)
  Chronic Care Management   Outreach Note  08/11/2019 Name: Mekayla Soman MRN: [id number] DOB: [birth year]  Judith Blonder Mccaster is a 69 y.o. year old female who is a primary care patient of Steele Sizer, MD. I reached out to [employer] by phone today in response to a referral sent by Ms. Judith Blonder Tri[place]'s health plan.     An unsuccessful telephone outreach was attempted today. The patient was referred to the case management team for assistance with care management and care coordination.   Follow Up Plan: A HIPPA compliant phone message was left for the patient providing contact information and requesting a return call.  The care management team will reach out to the patient again over the next 7 days.  If patient returns call to provider office, please advise to call [facility] [place], [place], [place], [place] [address] Direct Dial: [phone] [email or web address] Website: [email or web address]

## 2019-08-12 NOTE — Chronic Care Management (AMB) (Signed)
  Chronic Care Management   Outreach Note  08/12/2019 Name: Eleftheria Taborn MRN: [id number] DOB: 11-May-1950  Judith Blonder Alberts is a 69 y.o. year old female who is a primary care patient of Steele Sizer, MD. I reached out to [employer] by phone today in response to a referral sent by Ms. Judith Blonder Kludt's health plan.     A second unsuccessful telephone outreach was attempted today. The patient was referred to the case management team for assistance with care management and care coordination.   Follow Up Plan: A HIPPA compliant phone message was left for the patient providing contact information and requesting a return call.  The care management team will reach out to the patient again over the next 7 days.  If patient returns call to provider office, please advise to call [facility] at [place], [place], [place], [place] [address] Direct Dial: [phone] [email or web address] Website: [email or web address]

## 2019-08-16 NOTE — Chronic Care Management (AMB) (Signed)
  Chronic Care Management   Outreach Note  08/16/2019 Name: Eupha Lobb MRN: [id number] DOB: 31-Jan-1951  Judith Blonder Baksh is a 69 y.o. year old female who is a primary care patient of Steele Sizer, MD. I reached out to [employer] by phone today in response to a referral sent by Ms. Judith Blonder [place]'s health plan.     Third unsuccessful telephone outreach was attempted today. The patient was referred to the case management team for assistance with care management and care coordination. The patient's primary care provider has been notified of our unsuccessful attempts to make or maintain contact with the patient. The care management team is pleased to engage with this patient at any time in the future should he/she be interested in assistance from the care management team.   Follow Up Plan: The care management team is available to follow up with the patient after provider conversation with the patient regarding recommendation for care management engagement and subsequent re-referral to the care management team.   Noreene Larsson, [place], [place], [place] [address] Direct Dial: [phone] [email or web address] Website: [email or web address]

## 2019-08-17 ENCOUNTER — Ambulatory Visit (INDEPENDENT_AMBULATORY_CARE_PROVIDER_SITE_OTHER): Payer: Medicare HMO | Admitting: Family Medicine

## 2019-08-17 ENCOUNTER — Encounter: Payer: Self-pay | Admitting: Family Medicine

## 2019-08-17 ENCOUNTER — Other Ambulatory Visit: Payer: Self-pay

## 2019-08-17 VITALS — BP 126/70 | HR 77 | Temp 97.5°F | Resp 16 | Ht 65.0 in | Wt 262.6 lb

## 2019-08-17 DIAGNOSIS — E785 Hyperlipidemia, unspecified: Secondary | ICD-10-CM

## 2019-08-17 DIAGNOSIS — M170 Bilateral primary osteoarthritis of knee: Secondary | ICD-10-CM | POA: Diagnosis not present

## 2019-08-17 DIAGNOSIS — E1169 Type 2 diabetes mellitus with other specified complication: Secondary | ICD-10-CM

## 2019-08-17 MED ORDER — LIDOCAINE HCL (PF) 1 % IJ SOLN
4.0000 mL | Freq: Once | INTRAMUSCULAR | Status: AC
  Administered 2019-08-17: 4 mL

## 2019-08-17 MED ORDER — TRIAMCINOLONE ACETONIDE 40 MG/ML IJ SUSP
80.0000 mg | Freq: Once | INTRAMUSCULAR | Status: AC
  Administered 2019-08-17: 80 mg via INTRAMUSCULAR

## 2019-08-17 NOTE — Progress Notes (Signed)
Name: Alexandra Foster   MRN: [id number]    DOB: Jul 30, 1950   Date:08/17/2019       Progress Note  Subjective  Chief Complaint  Chief Complaint  Patient presents with  . Knee Pain    She thinks she needs a shot in her knee to help with pain.    HPI  OAboth knees:no instability, pain is aching like and only when walking, no effusion or redness.She states her pain has been worse over the past week, she is also worried because she will be taking care of her grand-daughter next week and needs to be able to walk.  DMII: last A1C was done 07/14/2019 and it was 6.3%. Explained steroid injection causes elevation of glucose and she needs to be very careful with her diet over the next week.   Patient Active Problem List   Diagnosis Date Noted  . Personal history of covid-19 07/14/2019  . Bilateral lower extremity edema 05/03/2018  . Lower extremity pain, bilateral 05/03/2018  . Diabetes mellitus type 2 in obese ([place]) 04/23/2018  . Osteoarthritis of both knees 01/14/2018  . Urge incontinence 03/14/2016  . Osteopenia 02/13/2016  . OSA (obstructive sleep apnea) 01/08/2016  . Allergic rhinitis 04/13/2015  . COPD (chronic obstructive pulmonary disease) ([place]) 09/18/2014  . Morbid obesity ([place]) 09/18/2014  . Esophagitis, reflux 09/18/2014  . Essential hypertension 09/21/2006    Past Surgical History:  Procedure Laterality Date  . ABDOMINAL HYSTERECTOMY    . COLONOSCOPY  10/29/2006   Dr Allen Norris  . COLONOSCOPY WITH PROPOFOL N/A 11/05/2016   Procedure: COLONOSCOPY WITH PROPOFOL;  Surgeon: Robert Bellow, MD;  Location: [facility] ENDOSCOPY;  Service: Endoscopy;  Laterality: N/A;  . NASAL SINUS SURGERY  2002   Dr Carlis Abbott    Family History  Problem Relation Age of Onset  . Congestive Heart Failure Mother   . Coronary artery disease Father   . Breast cancer Sister 66  . Heart disease Brother   . Varicose Veins Brother   . Alcohol abuse Brother     Social History   Tobacco Use  . Smoking  status: Former Smoker    Packs/day: 2.00    Years: 40.00    Pack years: 80.00    Types: Cigarettes    Quit date: 2008    Years since quitting: 13.3  . Smokeless tobacco: Former Systems developer    Types: Snuff    Quit date: 04/2001  . Tobacco comment: smoking cessation materials not required  Substance Use Topics  . Alcohol use: No    Alcohol/week: 0.0 standard drinks     Current Outpatient Medications:  .  albuterol (PROVENTIL) (2.5 MG/3ML) 0.083% nebulizer solution, Take 3 mLs (2.5 mg total) by nebulization every 6 (six) hours as needed for wheezing or shortness of breath., Disp: 150 mL, Rfl: 1 .  albuterol (VENTOLIN HFA) 108 (90 Base) MCG/ACT inhaler, Inhale 1-2 puffs into the lungs every 4 (four) hours as needed for wheezing or shortness of breath., Disp: 54 g, Rfl: 1 .  aspirin 81 MG tablet, Take 1 tablet (81 mg total) by mouth daily., Disp: 30 tablet, Rfl: 0 .  atorvastatin (LIPITOR) 40 MG tablet, TAKE 1 TABLET EVERY DAY, Disp: 90 tablet, Rfl: 1 .  Blood Glucose Calibration (ACCU-CHEK AVIVA) SOLN, 1 each by In Vitro route daily., Disp: 1 each, Rfl: 0 .  Blood Glucose Monitoring Suppl (ACCU-CHEK AVIVA PLUS) w/Device KIT, USE AS DIRECTED, Disp: 1 kit, Rfl: 0 .  calcium carbonate (OSCAL) 1500 (600 Ca)  MG TABS tablet, Take 600 mg of elemental calcium by mouth 2 (two) times daily with a meal., Disp: , Rfl:  .  docusate sodium (COLACE) 100 MG capsule, Take 100 mg by mouth daily., Disp: , Rfl:  .  fluticasone (FLONASE) 50 MCG/ACT nasal spray, Place 2 sprays into both nostrils daily., Disp: 48 g, Rfl: 1 .  glucose blood (ACCU-CHEK AVIVA PLUS) test strip, Use as instructed, Disp: 100 each, Rfl: 12 .  Lancets (ACCU-CHEK SOFT TOUCH) lancets, Use as instructed, Disp: 100 each, Rfl: 12 .  loratadine (CLARITIN) 10 MG tablet, Take 1 tablet (10 mg total) by mouth daily., Disp: 90 tablet, Rfl: 1 .  metFORMIN (GLUCOPHAGE-XR) 750 MG 24 hr tablet, Take 1 tablet (750 mg total) by mouth daily with breakfast.,  Disp: 90 tablet, Rfl: 1 .  montelukast (SINGULAIR) 10 MG tablet, Take 1 tablet (10 mg total) by mouth at bedtime., Disp: 90 tablet, Rfl: 1 .  Multiple Vitamins-Minerals (MULTIVITAL PO), Take 1 tablet by mouth daily., Disp: , Rfl:  .  omeprazole (PRILOSEC) 20 MG capsule, Take 1 capsule (20 mg total) by mouth daily., Disp: 90 capsule, Rfl: 1 .  Thiamine HCl (VITAMIN B-1) 250 MG tablet, Take 250 mg by mouth daily., Disp: , Rfl:  .  Tiotropium Bromide-Olodaterol (STIOLTO RESPIMAT) 2.5-2.5 MCG/ACT AERS, Inhale 2 puffs into the lungs daily., Disp: 12 g, Rfl: 0 .  triamterene-hydrochlorothiazide (DYAZIDE) 37.5-25 MG capsule, Take 1 each (1 capsule total) by mouth daily., Disp: 90 capsule, Rfl: 1 .  vitamin B-12 (CYANOCOBALAMIN) 100 MCG tablet, Take 100 mcg by mouth daily., Disp: , Rfl:   No Known Allergies  I personally reviewed active problem list, medication list, allergies, family history, social history, health maintenance with the patient/caregiver today.   ROS  Ten systems reviewed and is negative except as mentioned in HPI   Objective  Vitals:   08/17/19 1127  BP: 126/70  Pulse: 77  Resp: 16  Temp: (!) 97.5 F (36.4 C)  TempSrc: Temporal  SpO2: 93%  Weight: 262 lb 9.6 oz (119.1 kg)  Height: '5\' 5"'  (1.651 m)    Body mass index is 43.7 kg/m.  Physical Exam  Constitutional: Patient appears well-developed and well-nourished. Obese  No distress.  HEENT: head atraumatic, normocephalic, pupils equal and reactive to light Cardiovascular: Normal rate, regular rhythm and normal heart sounds.  No murmur heard. No BLE edema. Pulmonary/Chest: Effort normal and breath sounds normal. No respiratory distress. Abdominal: Soft.  There is no tenderness. Muscular Skeletal: crepitus with extension of both knees, mild effusion, no redness.  Psychiatric: Patient has a normal mood and affect. behavior is normal. Judgment and thought content normal.  Recent Results (from the past 2160 hour(s))   Lipid panel     Status: Abnormal   Collection Time: 07/14/19  9:40 AM  Result Value Ref Range   Cholesterol 105 <200 mg/dL   HDL 34 (L) > OR = 50 mg/dL   Triglycerides 100 <150 mg/dL   LDL Cholesterol (Calc) 52 mg/dL (calc)    Comment: Reference range: <100 . Desirable range <100 mg/dL for primary prevention;   <70 mg/dL for patients with CHD or diabetic patients  with > or = 2 CHD risk factors. [name] LDL-C is now calculated using the Martin-Hopkins  calculation, which is a validated novel method providing  better accuracy than the Friedewald equation in the  estimation of LDL-C.  Cresenciano Genre et al. Annamaria Helling. 4665;993(57): 2061-2068  (http://education.[employer].com/faq/FAQ164)    Total CHOL/HDL Ratio 3.1 <  5.0 (calc)   Non-HDL Cholesterol (Calc) 71 <130 mg/dL (calc)    Comment: For patients with diabetes plus 1 major ASCVD risk  factor, treating to a non-HDL-C goal of <100 mg/dL  (LDL-C of <70 mg/dL) is considered a therapeutic  option.   COMPLETE METABOLIC PANEL WITH GFR     Status: Abnormal   Collection Time: 07/14/19  9:40 AM  Result Value Ref Range   Glucose, Bld 114 (H) 65 - 99 mg/dL    Comment: .            Fasting reference interval . For someone without known diabetes, a glucose value between 100 and 125 mg/dL is consistent with prediabetes and should be confirmed with a follow-up test. .    BUN 20 7 - 25 mg/dL   Creat 0.94 (H) 0.60 - 0.93 mg/dL    Comment: For patients >3 years of age, the reference limit for Creatinine is approximately 13% higher for people identified as African-American. .    GFR, Est Non African American 61 > OR = 60 mL/min/1.45m   GFR, Est African American 71 > OR = 60 mL/min/1.710m  BUN/Creatinine Ratio 21 6 - 22 (calc)   Sodium 145 135 - 146 mmol/L   Potassium 4.2 3.5 - 5.3 mmol/L   Chloride 103 98 - 110 mmol/L   CO2 32 20 - 32 mmol/L   Calcium 9.8 8.6 - 10.4 mg/dL   Total Protein 6.7 6.1 - 8.1 g/dL   Albumin 3.8 3.6 - 5.1 g/dL    Globulin 2.9 1.9 - 3.7 g/dL (calc)   AG Ratio 1.3 1.0 - 2.5 (calc)   Total Bilirubin 0.6 0.2 - 1.2 mg/dL   Alkaline phosphatase (APISO) 86 37 - 153 U/L   AST 21 10 - 35 U/L   ALT 25 6 - 29 U/L  Vitamin B12     Status: Abnormal   Collection Time: 07/14/19  9:40 AM  Result Value Ref Range   Vitamin B-12 >2,000 (H) 200 - 1,100 pg/mL  Hemoglobin A1c     Status: Abnormal   Collection Time: 07/14/19  9:40 AM  Result Value Ref Range   Hgb A1c MFr Bld 6.3 (H) <5.7 % of total Hgb    Comment: For someone without known diabetes, a hemoglobin  A1c value between 5.7% and 6.4% is consistent with prediabetes and should be confirmed with a  follow-up test. . For someone with known diabetes, a value <7% indicates that their diabetes is well controlled. A1c targets should be individualized based on duration of diabetes, age, comorbid conditions, and other considerations. . This assay result is consistent with an increased risk of diabetes. . Currently, no consensus exists regarding use of hemoglobin A1c for diagnosis of diabetes for children. .    Mean Plasma Glucose 134 (calc)   eAG (mmol/L) 7.4 (calc)    PHQ2/9: [facility] 08/17/2019 07/14/2019 05/26/2019 05/12/2019 04/28/2019  Decreased Interest 0 0 0 0 0  Down, Depressed, Hopeless 0 0 0 0 0  PHQ - 2 Score 0 0 0 0 0  Altered sleeping 0 0 - 0 0  Tired, decreased energy 0 0 - 0 0  Change in appetite 0 0 - 0 0  Feeling bad or failure about yourself  0 0 - 0 0  Trouble concentrating 0 0 - 0 0  Moving slowly or fidgety/restless 0 0 - 0 0  Suicidal thoughts 0 0 - 0 0  PHQ-9 Score 0 0 -  0 0  Difficult doing work/chores - - - - Not difficult at all  Some recent data might be hidden    phq 9 is negative   Fall Risk: Fall Risk  08/17/2019 07/14/2019 05/26/2019 05/12/2019 04/28/2019  Falls in the past year? 0 0 0 0 0  Number falls in past yr: 0 0 0 0 0  Injury with Fall? 0 0 0 0 0  Risk for fall due to : - - No Fall Risks - -   Follow up - - Falls prevention discussed - -     Functional Status Survey: Is the patient deaf or have difficulty hearing?: No Does the patient have difficulty seeing, even when wearing glasses/contacts?: No Does the patient have difficulty concentrating, remembering, or making decisions?: No Does the patient have difficulty walking or climbing stairs?: Yes Does the patient have difficulty dressing or bathing?: No Does the patient have difficulty doing errands alone such as visiting a doctor's office or shopping?: No    Assessment & Plan  1. Primary osteoarthritis of both knees  Consent form signed Lateral approach of both knees  Area prepped with alcohol  Injection with lidocaine 1% and Kenalog 66m/1 ml injected on both knee joints  Patient tolerated procedure well No side effects  Lidocaine 1 % 4 ml Kenalog 40/ml times two   2. Dyslipidemia associated with type 2 diabetes mellitus [place]  She will follow a strict diabetes diet next week

## 2019-08-28 DIAGNOSIS — G4733 Obstructive sleep apnea (adult) (pediatric): Secondary | ICD-10-CM | POA: Diagnosis not present

## 2019-08-28 DIAGNOSIS — J449 Chronic obstructive pulmonary disease, unspecified: Secondary | ICD-10-CM | POA: Diagnosis not present

## 2019-09-02 ENCOUNTER — Telehealth: Payer: Self-pay | Admitting: Family Medicine

## 2019-09-02 NOTE — Chronic Care Management (AMB) (Signed)
  Chronic Care Management   Outreach Note  09/02/2019 Name: [name] MRN: [id number] DOB: 1951/03/13  Alexandra Foster is a 69 y.o. year old female who is a primary care patient of Steele Sizer, MD. I reached out to [employer] by phone today in response to a referral sent by Ms. Alexandra Blonder Walters's health plan.     An unsuccessful telephone outreach was attempted today. The patient was referred to the case management team for assistance with care management and care coordination.   Follow Up Plan: A HIPPA compliant phone message was left for the patient providing contact information and requesting a return call. The care management team will reach out to the patient again over the next 7 days. If patient returns call to provider office, please advise to call [place] at [phone].  [place], [place] [address] Direct Dial: [phone] [email or web address]: [email or web address]

## 2019-09-08 NOTE — Chronic Care Management (AMB) (Signed)
  Chronic Care Management   Note  09/08/2019 Name: Krista Godsil MRN: [id number] DOB: 03/19/1951  Alexandra Foster is a 69 y.o. year old female who is a primary care patient of Steele Sizer, MD and is actively engaged with the care management team. I reached out to [employer] by phone today to assist with scheduling an initial visit with the Pharmacist.  Follow up plan: Patient declines further follow up and engagement by the care management team. Appropriate care team members and provider have been notified via electronic communication. The care management team is available to follow up with the patient after provider conversation with the patient regarding recommendation for care management engagement and subsequent re-referral to the care management team.    [place], [place], [place] [address] Direct Dial: [email or web address] Website: [email or web address]

## 2019-09-26 ENCOUNTER — Other Ambulatory Visit: Payer: Self-pay

## 2019-09-26 DIAGNOSIS — J441 Chronic obstructive pulmonary disease with (acute) exacerbation: Secondary | ICD-10-CM

## 2019-09-26 MED ORDER — ACCU-CHEK AVIVA PLUS W/DEVICE KIT: 1.0000 | PACK | 0 refills | Status: DC

## 2019-09-26 MED ORDER — ALBUTEROL SULFATE (2.5 MG/3ML) 0.083% IN NEBU: 2.5000 mg | INHALATION_SOLUTION | Freq: Four times a day (QID) | RESPIRATORY_TRACT | 1 refills | Status: DC | PRN

## 2019-09-26 MED ORDER — ACCU-CHEK AVIVA PLUS W/DEVICE KIT: 3.0000 | PACK | Freq: Three times a day (TID) | 0 refills | Status: AC

## 2019-09-27 ENCOUNTER — Telehealth: Payer: Self-pay | Admitting: Family Medicine

## 2019-09-27 ENCOUNTER — Other Ambulatory Visit: Payer: Self-pay

## 2019-09-27 NOTE — Telephone Encounter (Signed)
Copied from [place] [id number]. Topic: General - Inquiry >> Sep 26, 2019  3:47 PM Richardo Priest, NT wrote: Reason for CRM: Patient called in stating she would like to speak with PCP or her team ASAP. Patient did not want to discuss with agent and would not say what it was in regards to, just stated it was urgent. Please advise.

## 2019-09-28 DIAGNOSIS — J449 Chronic obstructive pulmonary disease, unspecified: Secondary | ICD-10-CM | POA: Diagnosis not present

## 2019-09-28 DIAGNOSIS — G4733 Obstructive sleep apnea (adult) (pediatric): Secondary | ICD-10-CM | POA: Diagnosis not present

## 2019-10-03 ENCOUNTER — Other Ambulatory Visit: Payer: Self-pay | Admitting: *Deleted

## 2019-10-03 ENCOUNTER — Other Ambulatory Visit: Payer: Self-pay | Admitting: Family Medicine

## 2019-10-03 DIAGNOSIS — J441 Chronic obstructive pulmonary disease with (acute) exacerbation: Secondary | ICD-10-CM

## 2019-10-03 MED ORDER — ALBUTEROL SULFATE (2.5 MG/3ML) 0.083% IN NEBU: 2.5000 mg | INHALATION_SOLUTION | Freq: Four times a day (QID) | RESPIRATORY_TRACT | 1 refills | Status: DC | PRN

## 2019-10-03 NOTE — Telephone Encounter (Signed)
Patient requesting albuterol (Proventil) 0.083% nebulizer solution, Dispense 150 ml with 1 refill be resent to [facility]. Spoke with Ebony Hail at [facility] prescription never received. Approving request.

## 2019-10-03 NOTE — Telephone Encounter (Signed)
RX REFILL albuterol (PROVENTIL) (2.5 MG/3ML) 0.083% nebulizer solution  [place], [place] Phone:  [phone]  Fax:  [phone]

## 2019-10-18 ENCOUNTER — Telehealth: Payer: Self-pay

## 2019-10-18 NOTE — Telephone Encounter (Signed)
Copied from [place] [phone]. Topic: General - Inquiry >> Oct 10, 2019 11:32 AM Greggory Keen D wrote: Reason for CRM: Pt called asking for  a prescription for prednisone for her sinus.  She stated she is not coming in the office and that Dr. Ruthine Dose has sent it to the pharmacy before.  [address]  CB#  [phone]

## 2019-10-25 ENCOUNTER — Other Ambulatory Visit: Payer: Self-pay | Admitting: Emergency Medicine

## 2019-10-25 NOTE — Progress Notes (Unsigned)
mammogram

## 2019-10-26 ENCOUNTER — Other Ambulatory Visit: Payer: Self-pay | Admitting: Family Medicine

## 2019-10-26 DIAGNOSIS — R059 Cough, unspecified: Secondary | ICD-10-CM

## 2019-10-26 NOTE — Telephone Encounter (Signed)
Requested medication (s) are due for refill today: yesp   Requested medication (s) are on the active medication list: yes  Last refill: 07/21/2019  Future visit scheduled: yes  Notes to clinic:  medication not assigned to a protocol, review manually   Requested Prescriptions  Pending Prescriptions Disp [place] 2.5-2.5 MCG/ACT AERS [Pharmacy Med Name: STIOLTO RESPIMAT 2.5-2.5 MCG/ACT Aerosol Solution] 12 g 0    Sig: Inhale 2 puffs into the lungs daily.      Off-Protocol Failed - 10/26/2019 10:33 AM      Failed - Medication not assigned to a protocol, review manually.      Passed - Valid encounter within last 12 months    Recent Outpatient Visits           2 months ago Primary osteoarthritis of both [facility], Drue Stager, MD   3 months ago Diabetes mellitus type 2 in obese [facility])   [facility] Steele Sizer, MD   5 months ago Pneumonia due to COVID-19 virus   [facility] Steele Sizer, MD   6 months ago Cough   [facility] Steele Sizer, MD   10 months ago OSA (obstructive sleep apnea)   [facility] Steele Sizer, MD       Future Appointments             In 3 weeks Ancil Boozer, Drue Stager, MD [facility], [place]   In 7 months  [facility], [facility]

## 2019-10-27 ENCOUNTER — Other Ambulatory Visit: Payer: Self-pay

## 2019-10-27 ENCOUNTER — Ambulatory Visit (INDEPENDENT_AMBULATORY_CARE_PROVIDER_SITE_OTHER): Payer: Medicare HMO | Admitting: Family Medicine

## 2019-10-27 ENCOUNTER — Encounter: Payer: Self-pay | Admitting: Family Medicine

## 2019-10-27 VITALS — BP 128/78 | HR 96 | Temp 97.6°F | Resp 18 | Ht 65.0 in | Wt 265.2 lb

## 2019-10-27 DIAGNOSIS — J324 Chronic pansinusitis: Secondary | ICD-10-CM

## 2019-10-27 DIAGNOSIS — J441 Chronic obstructive pulmonary disease with (acute) exacerbation: Secondary | ICD-10-CM

## 2019-10-27 MED ORDER — PREDNISONE 10 MG (21) PO TBPK: ORAL_TABLET | ORAL | 0 refills | Status: DC

## 2019-10-27 MED ORDER — BENZONATATE 100 MG PO CAPS: 100.0000 mg | ORAL_CAPSULE | Freq: Three times a day (TID) | ORAL | 2 refills | Status: DC | PRN

## 2019-10-27 MED ORDER — PROMETHAZINE-CODEINE 6.25-10 MG/5ML PO SOLN: 5.0000 mL | Freq: Three times a day (TID) | ORAL | 0 refills | Status: DC | PRN

## 2019-10-27 MED ORDER — IPRATROPIUM-ALBUTEROL 0.5-2.5 (3) MG/3ML IN SOLN: 3.0000 mL | Freq: Three times a day (TID) | RESPIRATORY_TRACT | 0 refills | Status: DC | PRN

## 2019-10-27 MED ORDER — AZITHROMYCIN 250 MG PO TABS: 250.0000 mg | ORAL_TABLET | Freq: Every day | ORAL | 0 refills | Status: DC

## 2019-10-27 NOTE — Progress Notes (Signed)
Patient ID: Alexandra Foster, female    DOB: Oct 02, 1950, 69 y.o.   MRN: [id number]  PCP: Steele Sizer, MD  Chief Complaint  Patient presents with  . Cough  . URI  . COPD    Subjective:   Alexandra Foster is a 69 y.o. female, presents to clinic with CC of the following:  HPI  Patient is a 69 year old female who presents with a COPD exacerbation and chronic sinusitis.  She states that she has been coughing and congested, worse than her baseline for over 2 weeks, and she brings in an old bottle of cough medicine and would like a refill.  She does have albuterol nebs at home, she reports increased cough, sputum production, states that she usually gets better with cough syrup and often require steroids.  She denies any chest pain, fever, sweats, fatigue, myalgias.  She reports that she has a long history of sinus congestion and recurrent sinus infections following a sinus surgery that made her much worse (over 10 years ago).  She does follow with ENT (Dr Pryor Ochoa).  She states that her congestion is about at her baseline and she denies any acute worsening of sinus pain or pressure.  Patient Active Problem List   Diagnosis Date Noted  . Personal history of covid-19 07/14/2019  . Bilateral lower extremity edema 05/03/2018  . Lower extremity pain, bilateral 05/03/2018  . Diabetes mellitus type 2 in obese ([place]) 04/23/2018  . Osteoarthritis of both knees 01/14/2018  . Urge incontinence 03/14/2016  . Osteopenia 02/13/2016  . OSA (obstructive sleep apnea) 01/08/2016  . Allergic rhinitis 04/13/2015  . COPD (chronic obstructive pulmonary disease) ([place]) 09/18/2014  . Morbid obesity ([place]) 09/18/2014  . Esophagitis, reflux 09/18/2014  . Essential hypertension 09/21/2006      Current Outpatient Medications:  .  albuterol (PROVENTIL) (2.5 MG/3ML) 0.083% nebulizer solution, Take 3 mLs (2.5 mg total) by nebulization every 6 (six) hours as needed for wheezing or shortness of breath., Disp:  150 mL, Rfl: 1 .  aspirin 81 MG tablet, Take 1 tablet (81 mg total) by mouth daily., Disp: 30 tablet, Rfl: 0 .  atorvastatin (LIPITOR) 40 MG tablet, TAKE 1 TABLET EVERY DAY, Disp: 90 tablet, Rfl: 1 .  calcium carbonate (OSCAL) 1500 (600 Ca) MG TABS tablet, Take 600 mg of elemental calcium by mouth 2 (two) times daily with a meal., Disp: , Rfl:  .  docusate sodium (COLACE) 100 MG capsule, Take 100 mg by mouth daily., Disp: , Rfl:  .  fluticasone (FLONASE) 50 MCG/ACT nasal spray, Place 2 sprays into both nostrils daily., Disp: 48 g, Rfl: 1 .  glucose blood (ACCU-CHEK AVIVA PLUS) test strip, Use as instructed, Disp: 100 each, Rfl: 12 .  loratadine (CLARITIN) 10 MG tablet, Take 1 tablet (10 mg total) by mouth daily., Disp: 90 tablet, Rfl: 1 .  metFORMIN (GLUCOPHAGE-XR) 750 MG 24 hr tablet, Take 1 tablet (750 mg total) by mouth daily with breakfast., Disp: 90 tablet, Rfl: 1 .  montelukast (SINGULAIR) 10 MG tablet, Take 1 tablet (10 mg total) by mouth at bedtime., Disp: 90 tablet, Rfl: 1 .  Multiple Vitamins-Minerals (MULTIVITAL PO), Take 1 tablet by mouth daily., Disp: , Rfl:  .  omeprazole (PRILOSEC) 20 MG capsule, Take 1 capsule (20 mg total) by mouth daily., Disp: 90 capsule, Rfl: 1 .  STIOLTO RESPIMAT 2.5-2.5 MCG/ACT AERS, INHALE 2 PUFFS INTO THE LUNGS DAILY., Disp: 12 g, Rfl: 0 .  Thiamine HCl (VITAMIN B-1) 250 MG  tablet, Take 250 mg by mouth daily., Disp: , Rfl:  .  triamterene-hydrochlorothiazide (DYAZIDE) 37.5-25 MG capsule, Take 1 each (1 capsule total) by mouth daily., Disp: 90 capsule, Rfl: 1 .  vitamin B-12 (CYANOCOBALAMIN) 100 MCG tablet, Take 100 mcg by mouth daily., Disp: , Rfl:  .  Blood Glucose Calibration (ACCU-CHEK AVIVA) SOLN, 1 each by In Vitro route daily. (Patient not taking: Reported on 10/27/2019), Disp: 1 each, Rfl: 0 .  Blood Glucose Monitoring Suppl (ACCU-CHEK AVIVA PLUS) w/Device KIT, 1 each by Other route See admin instructions. (Patient not taking: Reported on 10/27/2019),  Disp: 1 kit, Rfl: 0 .  Lancets (ACCU-CHEK SOFT TOUCH) lancets, Use as instructed (Patient not taking: Reported on 10/27/2019), Disp: 100 each, Rfl: 12   No Known Allergies   Social History   Tobacco Use  . Smoking status: Former Smoker    Packs/day: 2.00    Years: 40.00    Pack years: 80.00    Types: Cigarettes    Quit date: 2008    Years since quitting: 13.5  . Smokeless tobacco: Former Systems developer    Types: Snuff    Quit date: 04/2001  . Tobacco comment: smoking cessation materials not required  Vaping Use  . Vaping Use: Never used  Substance Use Topics  . Alcohol use: No    Alcohol/week: 0.0 standard drinks  . Drug use: No      Chart Review Today: I personally reviewed active problem list, medication list, allergies, family history, social history, health maintenance, notes from last encounter, lab results, imaging with the patient/caregiver today.   Review of Systems 10 Systems reviewed and are negative for acute change except as noted in the HPI.     Objective:   Vitals:   10/27/19 1030  BP: 128/78  Pulse: 96  Resp: 18  Temp: 97.6 F (36.4 C)  TempSrc: Temporal  SpO2: 97%  Weight: 265 lb 3.2 oz (120.3 kg)  Height: 5' 5" (1.651 m)    Body mass index is 44.13 kg/m.  Physical Exam Vitals and nursing note reviewed.  Constitutional:      General: She is not in acute distress.    Appearance: Normal appearance. She is obese. She is not ill-appearing, toxic-appearing or diaphoretic.  HENT:     Head: No right periorbital erythema or left periorbital erythema.     Right Ear: Tympanic membrane, ear canal and external ear normal.     Left Ear: Ear canal and external ear normal.     Nose: Mucosal edema and congestion present.     Right Turbinates: Enlarged, swollen and pale.     Left Turbinates: Enlarged, swollen and pale.     Right Sinus: No maxillary sinus tenderness or frontal sinus tenderness.     Left Sinus: No maxillary sinus tenderness or frontal sinus  tenderness.     Mouth/Throat:     Pharynx: Oropharynx is clear. Uvula midline. Posterior oropharyngeal erythema (mild injection) present. No pharyngeal swelling, oropharyngeal exudate or uvula swelling.  Eyes:     General: No scleral icterus.       Right Foster: No discharge.        Left Foster: No discharge.     Conjunctiva/sclera: Conjunctivae normal.  Pulmonary:     Effort: Tachypnea (very mildly increased WOB) present. No accessory muscle usage or retractions.     Breath sounds: Examination of the right-lower field reveals decreased breath sounds. Examination of the left-lower field reveals decreased breath sounds. Decreased breath sounds and rhonchi  present. No wheezing or rales.     Comments: Coarse breath sounds bilaterally at the bases, frequent intermittent coughing.  Patient able to speak in full and complete sentences Abdominal:     General: Bowel sounds are normal.     Palpations: Abdomen is soft.  Skin:    Coloration: Skin is not jaundiced or pale.     Findings: No bruising or erythema.  Neurological:     Mental Status: She is alert.     Gait: Gait abnormal.  Psychiatric:        Mood and Affect: Mood normal.        Behavior: Behavior normal.      Results for orders placed or performed in visit on 07/14/19  Lipid panel  Result Value Ref Range   Cholesterol 105 <200 mg/dL   HDL 34 (L) > OR = 50 mg/dL   Triglycerides 100 <150 mg/dL   LDL Cholesterol (Calc) 52 mg/dL (calc)   Total CHOL/HDL Ratio 3.1 <5.0 (calc)   Non-HDL Cholesterol (Calc) 71 <130 mg/dL (calc)  COMPLETE METABOLIC PANEL WITH GFR  Result Value Ref Range   Glucose, Bld 114 (H) 65 - 99 mg/dL   BUN 20 7 - 25 mg/dL   Creat 0.94 (H) 0.60 - 0.93 mg/dL   GFR, Est Non African American 61 > OR = 60 mL/min/1.3m   GFR, Est African American 71 > OR = 60 mL/min/1.717m  BUN/Creatinine Ratio 21 6 - 22 (calc)   Sodium 145 135 - 146 mmol/L   Potassium 4.2 3.5 - 5.3 mmol/L   Chloride 103 98 - 110 mmol/L   CO2 32 20 -  32 mmol/L   Calcium 9.8 8.6 - 10.4 mg/dL   Total Protein 6.7 6.1 - 8.1 g/dL   Albumin 3.8 3.6 - 5.1 g/dL   Globulin 2.9 1.9 - 3.7 g/dL (calc)   AG Ratio 1.3 1.0 - 2.5 (calc)   Total Bilirubin 0.6 0.2 - 1.2 mg/dL   Alkaline phosphatase (APISO) 86 37 - 153 U/L   AST 21 10 - 35 U/L   ALT 25 6 - 29 U/L  Vitamin B12  Result Value Ref Range   Vitamin B-12 >2,000 (H) 200 - 1,100 pg/mL  Hemoglobin A1c  Result Value Ref Range   Hgb A1c MFr Bld 6.3 (H) <5.7 % of total Hgb   Mean Plasma Glucose 134 (calc)   eAG (mmol/L) 7.4 (calc)       Assessment & Plan:   1. Chronic obstructive pulmonary disease with acute exacerbation (HCC) Patient presents with URI type symptoms with acute on chronic sinusitis and COPD exacerbation though it does seem mild at this point.  She has nebulizers at home, has not been using them.  She does bring in with her codeine cough syrup which she states helps her at night for more severe coughing, she request a refill.  She does not take it during the day and she has tolerated this well without any adverse side effects.  She is also run out of [employer] are helpful for her and she usually takes 2-3 times a day when her COPD is little worse.  She notes that the abrupt changes in weather and rain tends to cause her to have a COPD exacerbation.  Weather has been very hot and humid with multiple rainy days in the last week.   She reports that she usually gets a short taper pack of steroids and this helps her get back to baseline.  Encouraged her to use Mucinex, can start the steroids and Z-Pak today, she would like to try DuoNebs as opposed to albuterol nebs, she is having a more difficult time sleeping due to frequent coughing that is worse at night and worse first thing in the morning. Currently she denies any acute worsening of her sinus congestion or pain.  She does see Dr. Pryor Ochoa.  She is nontender to her maxillary and frontal sinuses bilaterally does have  significant amount of edema and congestion on exam today with clear to mucoid nasal discharge very obstructed on the right side.  I explained to her that if she has any worsening of sinus congestion and pain with any severe facial pain fevers or headaches she would need a different antibiotic to treat a bacterial sinus infection.  - azithromycin (ZITHROMAX Z-PAK) 250 MG tablet; Take 1 tablet (250 mg total) by mouth daily. 525m PO day 1, then 2526mPO days 205  Dispense: 6 tablet; Refill: 0 - predniSONE (STERAPRED UNI-PAK 21 TAB) 10 MG (21) TBPK tablet; Take as directed on package.  (60 mg po on day 1, 50 mg po on day 2...)  Dispense: 21 tablet; Refill: 0 - Promethazine-Codeine 6.25-10 MG/5ML SOLN; Take 5 mLs by mouth 3 (three) times daily as needed.  Dispense: 180 mL; Refill: 0 - benzonatate (TESSALON PERLES) 100 MG capsule; Take 1-2 capsules (100-200 mg total) by mouth 3 (three) times daily as needed.  Dispense: 30 capsule; Refill: 2 - ipratropium-albuterol (DUONEB) 0.5-2.5 (3) MG/3ML SOLN; Take 3 mLs by nebulization 3 (three) times daily as needed.  Dispense: 180 mL; Refill: 0  2. Chronic pansinusitis Encouraged her to keep taking meds daily as prescribed by ENT and to follow-up here or with ENT if she experiences acute worsening.  Encouraged patient to follow-up if not improving over the next week.    LeDelsa GranaPA-C 10/27/19 10:43 AM

## 2019-10-27 NOTE — Patient Instructions (Addendum)
Please follow up with [place] or call if you are getting worse - you may need a stronger antibiotic if you are worsening.

## 2019-10-28 DIAGNOSIS — G4733 Obstructive sleep apnea (adult) (pediatric): Secondary | ICD-10-CM | POA: Diagnosis not present

## 2019-10-28 DIAGNOSIS — J449 Chronic obstructive pulmonary disease, unspecified: Secondary | ICD-10-CM | POA: Diagnosis not present

## 2019-11-16 ENCOUNTER — Other Ambulatory Visit: Payer: Self-pay | Admitting: Family Medicine

## 2019-11-16 ENCOUNTER — Telehealth: Payer: Self-pay | Admitting: Emergency Medicine

## 2019-11-16 DIAGNOSIS — Z1231 Encounter for screening mammogram for malignant neoplasm of breast: Secondary | ICD-10-CM

## 2019-11-16 NOTE — Telephone Encounter (Signed)
Please send order for mammogram to [facility]

## 2019-11-18 ENCOUNTER — Ambulatory Visit: Payer: Medicare HMO | Admitting: Family Medicine

## 2019-11-28 DIAGNOSIS — G4733 Obstructive sleep apnea (adult) (pediatric): Secondary | ICD-10-CM | POA: Diagnosis not present

## 2019-11-28 DIAGNOSIS — J449 Chronic obstructive pulmonary disease, unspecified: Secondary | ICD-10-CM | POA: Diagnosis not present

## 2019-12-16 ENCOUNTER — Ambulatory Visit
Admission: RE | Admit: 2019-12-16 | Discharge: 2019-12-16 | Disposition: A | Discharge status: Home / Self Care | Payer: Medicare HMO | Source: Ambulatory Visit | Attending: Family Medicine | Admitting: Family Medicine

## 2019-12-16 ENCOUNTER — Other Ambulatory Visit: Payer: Self-pay

## 2019-12-16 DIAGNOSIS — Z1231 Encounter for screening mammogram for malignant neoplasm of breast: Secondary | ICD-10-CM | POA: Insufficient documentation

## 2019-12-28 ENCOUNTER — Other Ambulatory Visit: Payer: Self-pay | Admitting: Family Medicine

## 2019-12-28 DIAGNOSIS — J302 Other seasonal allergic rhinitis: Secondary | ICD-10-CM

## 2019-12-28 DIAGNOSIS — I6529 Occlusion and stenosis of unspecified carotid artery: Secondary | ICD-10-CM

## 2019-12-28 DIAGNOSIS — I10 Essential (primary) hypertension: Secondary | ICD-10-CM

## 2019-12-28 NOTE — Telephone Encounter (Signed)
Requested Prescriptions  Pending Prescriptions Disp Refills  . fluticasone (FLONASE) 50 MCG/ACT nasal spray [Pharmacy Med Name: FLUTICASONE PROPIONATE 50 MCG/ACT Suspension] 48 g 1    Sig: USE 2 SPRAYS IN EACH NOSTRIL EVERY DAY     Ear, Nose, and Throat: Nasal Preparations - Corticosteroids Passed - 12/28/2019  7:17 PM      Passed - Valid encounter within last 12 months    Recent Outpatient Visits          2 months ago Chronic obstructive pulmonary disease with acute exacerbation [facility])   [facility] Delsa Grana, PA-C   4 months ago Primary osteoarthritis of both knees   [facility] [place], Drue Stager, MD   5 months ago Diabetes mellitus type 2 in obese [facility])   [facility] Steele Sizer, MD   7 months ago Pneumonia due to COVID-19 virus   [facility] Steele Sizer, MD   8 months ago Cough   [facility] Steele Sizer, MD      Future Appointments            In 5 months [facility] [place] [facility]

## 2019-12-29 DIAGNOSIS — J449 Chronic obstructive pulmonary disease, unspecified: Secondary | ICD-10-CM | POA: Diagnosis not present

## 2019-12-29 DIAGNOSIS — G4733 Obstructive sleep apnea (adult) (pediatric): Secondary | ICD-10-CM | POA: Diagnosis not present

## 2020-01-28 DIAGNOSIS — J449 Chronic obstructive pulmonary disease, unspecified: Secondary | ICD-10-CM | POA: Diagnosis not present

## 2020-01-28 DIAGNOSIS — G4733 Obstructive sleep apnea (adult) (pediatric): Secondary | ICD-10-CM | POA: Diagnosis not present

## 2020-01-31 DIAGNOSIS — J324 Chronic pansinusitis: Secondary | ICD-10-CM | POA: Diagnosis not present

## 2020-01-31 DIAGNOSIS — J331 Polypoid sinus degeneration: Secondary | ICD-10-CM | POA: Diagnosis not present

## 2020-01-31 DIAGNOSIS — R430 Anosmia: Secondary | ICD-10-CM | POA: Diagnosis not present

## 2020-02-10 ENCOUNTER — Encounter: Payer: Self-pay | Admitting: Family Medicine

## 2020-02-10 ENCOUNTER — Other Ambulatory Visit: Payer: Self-pay | Admitting: Emergency Medicine

## 2020-02-10 ENCOUNTER — Other Ambulatory Visit: Payer: Self-pay

## 2020-02-10 ENCOUNTER — Ambulatory Visit (INDEPENDENT_AMBULATORY_CARE_PROVIDER_SITE_OTHER): Payer: Medicare HMO | Admitting: Family Medicine

## 2020-02-10 VITALS — BP 120/80 | HR 83 | Temp 98.2°F | Resp 16 | Ht 65.0 in | Wt 264.6 lb

## 2020-02-10 DIAGNOSIS — K219 Gastro-esophageal reflux disease without esophagitis: Secondary | ICD-10-CM

## 2020-02-10 DIAGNOSIS — G4733 Obstructive sleep apnea (adult) (pediatric): Secondary | ICD-10-CM

## 2020-02-10 DIAGNOSIS — J411 Mucopurulent chronic bronchitis: Secondary | ICD-10-CM | POA: Diagnosis not present

## 2020-02-10 DIAGNOSIS — J441 Chronic obstructive pulmonary disease with (acute) exacerbation: Secondary | ICD-10-CM

## 2020-02-10 DIAGNOSIS — Z23 Encounter for immunization: Secondary | ICD-10-CM

## 2020-02-10 DIAGNOSIS — J324 Chronic pansinusitis: Secondary | ICD-10-CM | POA: Diagnosis not present

## 2020-02-10 DIAGNOSIS — M170 Bilateral primary osteoarthritis of knee: Secondary | ICD-10-CM

## 2020-02-10 DIAGNOSIS — E1169 Type 2 diabetes mellitus with other specified complication: Secondary | ICD-10-CM

## 2020-02-10 DIAGNOSIS — E785 Hyperlipidemia, unspecified: Secondary | ICD-10-CM

## 2020-02-10 DIAGNOSIS — E669 Obesity, unspecified: Secondary | ICD-10-CM

## 2020-02-10 DIAGNOSIS — I10 Essential (primary) hypertension: Secondary | ICD-10-CM

## 2020-02-10 DIAGNOSIS — R252 Cramp and spasm: Secondary | ICD-10-CM

## 2020-02-10 LAB — POCT GLYCOSYLATED HEMOGLOBIN (HGB A1C): Hemoglobin A1C: 6.4 % — AB (ref 4.0–5.6)

## 2020-02-10 LAB — POCT UA - MICROALBUMIN: Microalbumin Ur, POC: 20 mg/L

## 2020-02-10 MED ORDER — BACLOFEN 10 MG PO TABS: 10.0000 mg | ORAL_TABLET | Freq: Every day | ORAL | 0 refills | Status: DC | PRN

## 2020-02-10 MED ORDER — METFORMIN HCL ER 750 MG PO TB24: 750.0000 mg | ORAL_TABLET | Freq: Every day | ORAL | 1 refills | Status: DC

## 2020-02-10 MED ORDER — OMEPRAZOLE 20 MG PO CPDR: 20.0000 mg | DELAYED_RELEASE_CAPSULE | Freq: Every day | ORAL | 1 refills | Status: DC

## 2020-02-10 MED ORDER — BENZONATATE 100 MG PO CAPS: 100.0000 mg | ORAL_CAPSULE | Freq: Three times a day (TID) | ORAL | 0 refills | Status: DC | PRN

## 2020-02-10 MED ORDER — STIOLTO RESPIMAT 2.5-2.5 MCG/ACT IN AERS: 2.0000 | INHALATION_SPRAY | Freq: Every day | RESPIRATORY_TRACT | 1 refills | Status: DC

## 2020-02-10 MED ORDER — TRELEGY ELLIPTA 100-62.5-25 MCG/INH IN AEPB: 1.0000 | INHALATION_SPRAY | Freq: Every day | RESPIRATORY_TRACT | 1 refills | Status: DC

## 2020-02-10 NOTE — Progress Notes (Signed)
Name: Alexandra Foster   MRN: [id number]    DOB: 08-07-1950   Date:02/10/2020       Progress Note  Subjective  Chief Complaint  Chief Complaint  Patient presents with  . Follow-up  . Medication Refill    HPI  OAboth knees:no instability, pain is aching like and only when walking 6/10 , no effusion or redness.She has daily pain, no effusion.   HTN:she is compliant with medication, no proteinuria and is not on ARB or ACE.No chest pain, palpitation dizziness, she denies side effects of medication .   Dyslipidemia and carotid artery stenosis:she is taking statin therapy and aspirin 81 mg daily . No myalgias, low HDL discussed importance of eating more fish and tree nuts to increase good cholesterol   COPD:she had a flare  04/2018 and had to take a Zpack, also had COVID-19 pneumonia 04/2019, and another flare July 2021 and took another round of Zpack and prednisone. She has been coughing again over the past few weeks, but feels like is from post-nasal drainage. No sob very seldom has wheezing. She has been using Stiolto bid, explained dose is once daily.   Morbid obesity: she has been obese since reconstructive sinus surgery 12years ago she had to take steroids and gained weight. . She has co-morbidities, knee OA, HTN, dyslipidemia, DM. She is not exercising, likes potatoes chips and has desserts, also still drinking mountain dew   DM: she had a hgbA1C of 6.6%January 2019, today is 6.4 % . She denies polyphagia, polydipsia but she has nocturia. She likes sweets, also still eating chips and [facility].  She states glucose at home has been in the low 100. Discussed importance of diet again   GERD: discussed risk associated with long term PPI, she is doing well on Omeprazole, no heartburn or indigestion as long as she takes medication.Unchanged   OSA: sees neurologist every 6 months , very compliant with CPAP machine, she wakes feeling rested, no longer snoring at night. Echo  showed enlarged right atrium before starting on CPAP. She sates her machine is broken and needs a new one sent to [facility]   Carotid artery disease: doppler done 2015, repeat in 2020 and it was negative for plaque, she is under the care of Alexandra Foster AR: she sees Alexandra Foster last week, she has nasal congestion, post-nasal drainage, no fever or chills.   Muscle cramp: she states sometimes has a muscle spasm on abdomen when she turns, can last up to 20 minutes, we will try a muscle relaxer, explained importance of regular physical activity   Patient Active Problem List   Diagnosis Date Noted  . Personal history of COVID-19 07/14/2019  . Bilateral lower extremity edema 05/03/2018  . Lower extremity pain, bilateral 05/03/2018  . Diabetes mellitus type 2 in obese ([place]) 04/23/2018  . Osteoarthritis of both knees 01/14/2018  . Urge incontinence 03/14/2016  . Osteopenia 02/13/2016  . OSA (obstructive sleep apnea) 01/08/2016  . Allergic rhinitis 04/13/2015  . COPD (chronic obstructive pulmonary disease) ([place]) 09/18/2014  . Morbid obesity ([place]) 09/18/2014  . Esophagitis, reflux 09/18/2014  . Essential hypertension 09/21/2006    Past Surgical History:  Procedure Laterality Date  . ABDOMINAL HYSTERECTOMY    . COLONOSCOPY  10/29/2006   Dr Allen Norris  . COLONOSCOPY WITH PROPOFOL N/A 11/05/2016   Procedure: COLONOSCOPY WITH PROPOFOL;  Surgeon: Robert Bellow, MD;  Location: [facility] ENDOSCOPY;  Service: Endoscopy;  Laterality: N/A;  . NASAL SINUS SURGERY  2002   Dr Carlis Abbott    Family History  Problem Relation Age of Onset  . Congestive Heart Failure Mother   . Coronary artery disease Father   . Breast cancer Sister 45  . Heart disease Brother   . Varicose Veins Brother   . Alcohol abuse Brother     Social History   Tobacco Use  . Smoking status: Former Smoker    Packs/day: 2.00    Years: 40.00    Pack years: 80.00    Types: Cigarettes    Quit date: 2008    Years since  quitting: 13.8  . Smokeless tobacco: Former Systems developer    Types: Snuff    Quit date: 04/2001  . Tobacco comment: smoking cessation materials not required  Substance Use Topics  . Alcohol use: No    Alcohol/week: 0.0 standard drinks     Current Outpatient Medications:  .  albuterol (PROVENTIL) (2.5 MG/3ML) 0.083% nebulizer solution, Take 3 mLs (2.5 mg total) by nebulization every 6 (six) hours as needed for wheezing or shortness of breath., Disp: 150 mL, Rfl: 1 .  aspirin 81 MG tablet, Take 1 tablet (81 mg total) by mouth daily., Disp: 30 tablet, Rfl: 0 .  atorvastatin (LIPITOR) 40 MG tablet, TAKE 1 TABLET EVERY DAY, Disp: 90 tablet, Rfl: 1 .  Blood Glucose Calibration (ACCU-CHEK AVIVA) SOLN, 1 each by In Vitro route daily., Disp: 1 each, Rfl: 0 .  Blood Glucose Monitoring Suppl (ACCU-CHEK AVIVA PLUS) w/Device KIT, 1 each by Other route See admin instructions., Disp: 1 kit, Rfl: 0 .  calcium carbonate (OSCAL) 1500 (600 Ca) MG TABS tablet, Take 600 mg of elemental calcium by mouth 2 (two) times daily with a meal., Disp: , Rfl:  .  docusate sodium (COLACE) 100 MG capsule, Take 100 mg by mouth daily., Disp: , Rfl:  .  Fluticasone Propionate (XHANCE NA), Place 1 puff into the nose in the morning and at bedtime., Disp: , Rfl:  .  glucose blood (ACCU-CHEK AVIVA PLUS) test strip, Use as instructed, Disp: 100 each, Rfl: 12 .  Lancets (ACCU-CHEK SOFT TOUCH) lancets, Use as instructed, Disp: 100 each, Rfl: 12 .  metFORMIN (GLUCOPHAGE-XR) 750 MG 24 hr tablet, Take 1 tablet (750 mg total) by mouth daily with breakfast., Disp: 90 tablet, Rfl: 1 .  montelukast (SINGULAIR) 10 MG tablet, TAKE 1 TABLET AT BEDTIME, Disp: 90 tablet, Rfl: 1 .  Multiple Vitamins-Minerals (MULTIVITAL PO), Take 1 tablet by mouth daily., Disp: , Rfl:  .  omeprazole (PRILOSEC) 20 MG capsule, Take 1 capsule (20 mg total) by mouth daily., Disp: 90 capsule, Rfl: 1 .  Thiamine HCl (VITAMIN B-1) 250 MG tablet, Take 250 mg by mouth daily.,  Disp: , Rfl:  .  triamterene-hydrochlorothiazide (DYAZIDE) 37.5-25 MG capsule, TAKE 1 CAPSULE EVERY DAY, Disp: 90 capsule, Rfl: 1 .  vitamin B-12 (CYANOCOBALAMIN) 100 MCG tablet, Take 100 mcg by mouth daily., Disp: , Rfl:  .  baclofen (LIORESAL) 10 MG tablet, Take 1 tablet (10 mg total) by mouth daily as needed for muscle spasms., Disp: 90 each, Rfl: 0 .  benzonatate (TESSALON PERLES) 100 MG capsule, Take 1-2 capsules (100-200 mg total) by mouth 3 (three) times daily as needed., Disp: 90 capsule, Rfl: 0 .  Fluticasone-Umeclidin-Vilant (TRELEGY ELLIPTA) 100-62.5-25 MCG/INH AEPB, Inhale 1 puff into the lungs daily. In place of Stiolto, Disp: 180 each, Rfl: 1 .  loratadine (CLARITIN) 10 MG tablet, TAKE 1 TABLET EVERY DAY (Patient not taking: Reported on 02/10/2020),  Disp: 90 tablet, Rfl: 1  No Known Allergies  I personally reviewed active problem list, medication list, allergies, family history, social history, health maintenance with the patient/caregiver today.   ROS  Constitutional: Negative for fever or weight change.  Respiratory: Positive for cough but no  shortness of breath.   Cardiovascular: Negative   for chest pain or palpitations.  Gastrointestinal: Negative for abdominal pain, no bowel changes.  Musculoskeletal: Positive   for gait problem but no  joint swelling.  Skin: Negative for rash.  Neurological: Negative for dizziness or headache.  No other specific complaints in a complete review of systems (except as listed in HPI above).  Objective  Vitals:   02/10/20 1320  BP: 120/80  Pulse: 83  Resp: 16  Temp: 98.2 F (36.8 C)  TempSrc: Oral  SpO2: 99%  Weight: 264 lb 9.6 oz (120 kg)  Height: '5\' 5"'  (1.651 m)    Body mass index is 44.03 kg/m.  Physical Exam   Constitutional: Patient appears well-developed and well-nourished. Obese  No distress.  HEENT: head atraumatic, normocephalic, pupils equal and reactive to light,  neck supple Cardiovascular: Normal rate,  regular rhythm and normal heart sounds.  No murmur heard. No BLE edema. Pulmonary/Chest: Effort normal and breath sounds normal. No respiratory distress. Abdominal: Soft.  There is no tenderness.large abdomen  Psychiatric: Patient has a normal mood and affect. behavior is normal. Judgment and thought content normal.  Recent Results (from the past 2160 hour(s))  POCT HgB A1C     Status: Abnormal   Collection Time: 02/10/20  1:24 PM  Result Value Ref Range   Hemoglobin A1C 6.4 (A) 4.0 - 5.6 %   HbA1c POC (<> result, manual entry)     HbA1c, POC (prediabetic range)     HbA1c, POC (controlled diabetic range)        PHQ2/9: Depression screen [facility] 2/9 02/10/2020 10/27/2019 08/17/2019 07/14/2019 05/26/2019  Decreased Interest 0 0 0 0 0  Down, Depressed, Hopeless 0 0 0 0 0  PHQ - 2 Score 0 0 0 0 0  Altered sleeping - 0 0 0 -  Tired, decreased energy - 0 0 0 -  Change in appetite - 0 0 0 -  Feeling bad or failure about yourself  - 0 0 0 -  Trouble concentrating - 0 0 0 -  Moving slowly or fidgety/restless - 0 0 0 -  Suicidal thoughts - 0 0 0 -  PHQ-9 Score - 0 0 0 -  Difficult doing work/chores - Not difficult at all - - -  Some recent data might be hidden    phq 9 is negative   Fall Risk: Fall Risk  02/10/2020 10/27/2019 08/17/2019 07/14/2019 05/26/2019  Falls in the past year? 0 0 0 0 0  Number falls in past yr: 0 0 0 0 0  Injury with Fall? 0 0 0 0 0  Risk for fall due to : - - - - No Fall Risks  Follow up - Falls evaluation completed - - Falls prevention discussed     Functional Status Survey: Is the patient deaf or have difficulty hearing?: No Does the patient have difficulty seeing, even when wearing glasses/contacts?: No Does the patient have difficulty concentrating, remembering, or making decisions?: No Does the patient have difficulty walking or climbing stairs?: No Does the patient have difficulty dressing or bathing?: No Does the patient have difficulty doing errands alone  such as visiting a doctor's office or shopping?: No  Assessment & Plan  1. Diabetes mellitus type 2 in obese (HCC)  - POCT HgB A1C - metFORMIN (GLUCOPHAGE-XR) 750 MG 24 hr tablet; Take 1 tablet (750 mg total) by mouth daily with breakfast.  Dispense: 90 tablet; Refill: 1 - POCT UA - Microalbumin  2. Need for immunization against influenza  - Flu Vaccine QUAD High Dose(Fluad)  3. Chronic obstructive pulmonary disease with acute exacerbation (HCC)  - benzonatate (TESSALON PERLES) 100 MG capsule; Take 1-2 capsules (100-200 mg total) by mouth 3 (three) times daily as needed.  Dispense: 90 capsule; Refill: 0 - Tiotropium Bromide-Olodaterol (STIOLTO RESPIMAT) 2.5-2.5 MCG/ACT AERS; Inhale 2 puffs into the lungs daily.  Dispense: 12 g; Refill: 1  4. Gastroesophageal reflux disease without esophagitis  - omeprazole (PRILOSEC) 20 MG capsule; Take 1 capsule (20 mg total) by mouth daily.  Dispense: 90 capsule; Refill: 1  5. Mucopurulent chronic bronchitis ([place])  Stop stiolto  - Fluticasone-Umeclidin-Vilant (TRELEGY ELLIPTA) 100-62.5-25 MCG/INH AEPB; Inhale 1 puff into the lungs daily. In place of Stiolto  Dispense: 180 each; Refill: 1  6. Dyslipidemia associated with type 2 diabetes mellitus ([place])  On statin   7. Chronic pansinusitis  Under the care of Alexandra Foster  8. Primary osteoarthritis of both knees   9. Morbid obesity ([place])  Discussed with the patient the risk posed by an increased BMI. Discussed importance of portion control, calorie counting and at least 150 minutes of physical activity weekly. Avoid sweet beverages and drink more water. Eat at least 6 servings of fruit and vegetables daily   10. OSA (obstructive sleep apnea)  Sending new rx for CPAP supplies and  Machine to [facility] as requested by patient   11. Essential hypertension   12. Muscle cramp  On chest with movement  - baclofen (LIORESAL) 10 MG tablet; Take 1 tablet (10 mg total) by mouth daily as needed  for muscle spasms.  Dispense: 90 each; Refill: 0

## 2020-02-28 DIAGNOSIS — G4733 Obstructive sleep apnea (adult) (pediatric): Secondary | ICD-10-CM | POA: Diagnosis not present

## 2020-02-28 DIAGNOSIS — J449 Chronic obstructive pulmonary disease, unspecified: Secondary | ICD-10-CM | POA: Diagnosis not present

## 2020-02-29 ENCOUNTER — Telehealth: Payer: Self-pay

## 2020-02-29 NOTE — Telephone Encounter (Signed)
Pt would like to know if your able to give her a cortizone shot in her leg for leg pain (knee pain?)

## 2020-03-01 NOTE — Telephone Encounter (Signed)
appt scheduled

## 2020-03-05 NOTE — Progress Notes (Signed)
Name: Alexandra Foster   MRN: [id number]    DOB: [birth year]   Date:03/06/2020       Progress Note  Subjective  Chief Complaint  Cortisone injection   HPI    OAboth knees:no instability, pain is aching like and only when walking 6/10 but worse over the past week and could barely walk, she states both knees are bothering her, but right worse than left. She denies falls. Pain is aching and sometimes throbbing , on anterior knee and no radiation. Affect her gait at times   DM: glucose at home has been 120's fasting, last A1C at goal, denies polyphagia, polydipsia or polyuria  Patient Active Problem List   Diagnosis Date Noted  . Personal history of COVID-19 07/14/2019  . Bilateral lower extremity edema 05/03/2018  . Lower extremity pain, bilateral 05/03/2018  . Diabetes mellitus type 2 in obese ([place]) 04/23/2018  . Osteoarthritis of both knees 01/14/2018  . Urge incontinence 03/14/2016  . Osteopenia 02/13/2016  . OSA (obstructive sleep apnea) 01/08/2016  . Allergic rhinitis 04/13/2015  . COPD (chronic obstructive pulmonary disease) ([place]) 09/18/2014  . Morbid obesity ([place]) 09/18/2014  . Esophagitis, reflux 09/18/2014  . Essential hypertension 09/21/2006    Past Surgical History:  Procedure Laterality Date  . ABDOMINAL HYSTERECTOMY    . COLONOSCOPY  10/29/2006   Dr Allen Norris  . COLONOSCOPY WITH PROPOFOL N/A 11/05/2016   Procedure: COLONOSCOPY WITH PROPOFOL;  Surgeon: Robert Bellow, MD;  Location: [facility] ENDOSCOPY;  Service: Endoscopy;  Laterality: N/A;  . NASAL SINUS SURGERY  2002   Dr Carlis Abbott    Family History  Problem Relation Age of Onset  . Congestive Heart Failure Mother   . Coronary artery disease Father   . Breast cancer Sister 12  . Heart disease Brother   . Varicose Veins Brother   . Alcohol abuse Brother     Social History   Tobacco Use  . Smoking status: Former Smoker    Packs/day: 2.00    Years: 40.00    Pack years: 80.00    Types: Cigarettes    Quit  date: 2008    Years since quitting: 13.9  . Smokeless tobacco: Former Systems developer    Types: Snuff    Quit date: 04/2001  . Tobacco comment: smoking cessation materials not required  Substance Use Topics  . Alcohol use: No    Alcohol/week: 0.0 standard drinks     Current Outpatient Medications:  .  albuterol (PROVENTIL) (2.5 MG/3ML) 0.083% nebulizer solution, Take 3 mLs (2.5 mg total) by nebulization every 6 (six) hours as needed for wheezing or shortness of breath., Disp: 150 mL, Rfl: 1 .  aspirin 81 MG tablet, Take 1 tablet (81 mg total) by mouth daily., Disp: 30 tablet, Rfl: 0 .  atorvastatin (LIPITOR) 40 MG tablet, TAKE 1 TABLET EVERY DAY, Disp: 90 tablet, Rfl: 1 .  baclofen (LIORESAL) 10 MG tablet, Take 1 tablet (10 mg total) by mouth daily as needed for muscle spasms., Disp: 90 each, Rfl: 0 .  benzonatate (TESSALON PERLES) 100 MG capsule, Take 1-2 capsules (100-200 mg total) by mouth 3 (three) times daily as needed., Disp: 90 capsule, Rfl: 0 .  Blood Glucose Calibration (ACCU-CHEK AVIVA) SOLN, 1 each by In Vitro route daily., Disp: 1 each, Rfl: 0 .  Blood Glucose Monitoring Suppl (ACCU-CHEK AVIVA PLUS) w/Device KIT, 1 each by Other route See admin instructions., Disp: 1 kit, Rfl: 0 .  calcium carbonate (OSCAL) 1500 (600 Ca) MG TABS tablet, Take  600 mg of elemental calcium by mouth 2 (two) times daily with a meal., Disp: , Rfl:  .  docusate sodium (COLACE) 100 MG capsule, Take 100 mg by mouth daily., Disp: , Rfl:  .  Fluticasone Propionate (XHANCE NA), Place 1 puff into the nose in the morning and at bedtime., Disp: , Rfl:  .  Fluticasone-Umeclidin-Vilant (TRELEGY ELLIPTA) 100-62.5-25 MCG/INH AEPB, Inhale 1 puff into the lungs daily. In place of Stiolto, Disp: 180 each, Rfl: 1 .  glucose blood (ACCU-CHEK AVIVA PLUS) test strip, Use as instructed, Disp: 100 each, Rfl: 12 .  Lancets (ACCU-CHEK SOFT TOUCH) lancets, Use as instructed, Disp: 100 each, Rfl: 12 .  metFORMIN (GLUCOPHAGE-XR) 750 MG 24  hr tablet, Take 1 tablet (750 mg total) by mouth daily with breakfast., Disp: 90 tablet, Rfl: 1 .  montelukast (SINGULAIR) 10 MG tablet, TAKE 1 TABLET AT BEDTIME, Disp: 90 tablet, Rfl: 1 .  Multiple Vitamins-Minerals (MULTIVITAL PO), Take 1 tablet by mouth daily., Disp: , Rfl:  .  omeprazole (PRILOSEC) 20 MG capsule, Take 1 capsule (20 mg total) by mouth daily., Disp: 90 capsule, Rfl: 1 .  predniSONE (DELTASONE) 10 MG tablet, , Disp: , Rfl:  .  Thiamine HCl (VITAMIN B-1) 250 MG tablet, Take 250 mg by mouth daily., Disp: , Rfl:  .  triamterene-hydrochlorothiazide (DYAZIDE) 37.5-25 MG capsule, TAKE 1 CAPSULE EVERY DAY, Disp: 90 capsule, Rfl: 1 .  vitamin B-12 (CYANOCOBALAMIN) 100 MCG tablet, Take 100 mcg by mouth daily., Disp: , Rfl:   No Known Allergies  I personally reviewed active problem list, medication list, allergies, family history, social history with the patient/caregiver today.   ROS  Ten systems reviewed and is negative except as mentioned in HPI   Objective  Vitals:   03/06/20 0904  BP: 128/72  Pulse: 79  Resp: 16  Temp: 98 F (36.7 C)  TempSrc: Oral  SpO2: 96%  Weight: 263 lb 8 oz (119.5 kg)  Height: '5\' 5"'  (1.651 m)    Body mass index is 43.85 kg/m.  Physical Exam  Constitutional: Patient appears well-developed and well-nourished. Obese  No distress.  HEENT: head atraumatic, normocephalic, pupils equal and reactive to light,  neck supple Cardiovascular: Normal rate, regular rhythm and normal heart sounds.  No murmur heard. No BLE edema. Pulmonary/Chest: Effort normal and breath sounds normal. No respiratory distress. Abdominal: Soft.  There is no tenderness. Muscular Skeletal: crepitus with extension of both knees, mild effusion on right knee, no increase in warmth or redness, slow gait  Psychiatric: Patient has a normal mood and affect. behavior is normal. Judgment and thought content normal.  Recent Results (from the past 2160 hour(s))  POCT HgB A1C      Status: Abnormal   Collection Time: 02/10/20  1:24 PM  Result Value Ref Range   Hemoglobin A1C 6.4 (A) 4.0 - 5.6 %   HbA1c POC (<> result, manual entry)     HbA1c, POC (prediabetic range)     HbA1c, POC (controlled diabetic range)    POCT UA - Microalbumin     Status: Abnormal   Collection Time: 02/10/20  2:11 PM  Result Value Ref Range   Microalbumin Ur, POC 20 mg/L   Creatinine, POC     Albumin/Creatinine Ratio, Urine, POC        PHQ2/9: Depression screen [facility] 2/9 03/06/2020 02/10/2020 10/27/2019 08/17/2019 07/14/2019  Decreased Interest 2 0 0 0 0  Down, Depressed, Hopeless 0 0 0 0 0  PHQ - 2 Score  2 0 0 0 0  Altered sleeping 0 - 0 0 0  Tired, decreased energy 0 - 0 0 0  Change in appetite 0 - 0 0 0  Feeling bad or failure about yourself  0 - 0 0 0  Trouble concentrating 0 - 0 0 0  Moving slowly or fidgety/restless 0 - 0 0 0  Suicidal thoughts 0 - 0 0 0  PHQ-9 Score 2 - 0 0 0  Difficult doing work/chores - - Not difficult at all - -  Some recent data might be hidden    phq 9 is negative   Fall Risk: Fall Risk  03/06/2020 02/10/2020 10/27/2019 08/17/2019 07/14/2019  Falls in the past year? 0 0 0 0 0  Number falls in past yr: 0 0 0 0 0  Injury with Fall? 0 0 0 0 0  Risk for fall due to : - - - - -  Follow up - - Falls evaluation completed - -      Assessment & Plan  1. Dyslipidemia associated with type 2 diabetes mellitus ([place])  Explained steroids will increase her glucose levels and needs to avoid a lot of sweets specially with Thanksgiving coming up   2. Primary osteoarthritis of both knees   Consent signed: YES  Procedure: right knee  Injection Location:tried medially but too narrow of space and finished laterally  Equipment used: 25 gauge 1.5 inch needle Medication: 1 mL Kenalog (1m/1mL) Anesthesia: 1% Lidocaine w/o Epinephrine Cleaned and prepped: Betadine  The risks, benefits, treatment options discussed with patient prior to procedure.  Consent signed.  Area cleansed with sterile betadine. Overlying skin numbed with liquid nitrogen.  Patient tolerated procedure well with no complications and no bleeding. Bandage placed at site of injection. Patient instructed on potential for steroid reaction pain within the initial 24-48hr period. May use ice packs directly on injected site as needed.  3. Chronic pain of right knee

## 2020-03-06 ENCOUNTER — Encounter: Payer: Self-pay | Admitting: Family Medicine

## 2020-03-06 ENCOUNTER — Ambulatory Visit (INDEPENDENT_AMBULATORY_CARE_PROVIDER_SITE_OTHER): Payer: Medicare HMO | Admitting: Family Medicine

## 2020-03-06 ENCOUNTER — Other Ambulatory Visit: Payer: Self-pay

## 2020-03-06 VITALS — BP 128/72 | HR 79 | Temp 98.0°F | Resp 16 | Ht 65.0 in | Wt 263.5 lb

## 2020-03-06 DIAGNOSIS — M25561 Pain in right knee: Secondary | ICD-10-CM | POA: Diagnosis not present

## 2020-03-06 DIAGNOSIS — E1169 Type 2 diabetes mellitus with other specified complication: Secondary | ICD-10-CM

## 2020-03-06 DIAGNOSIS — G8929 Other chronic pain: Secondary | ICD-10-CM

## 2020-03-06 DIAGNOSIS — M170 Bilateral primary osteoarthritis of knee: Secondary | ICD-10-CM | POA: Diagnosis not present

## 2020-03-06 DIAGNOSIS — E785 Hyperlipidemia, unspecified: Secondary | ICD-10-CM

## 2020-03-29 DIAGNOSIS — G4733 Obstructive sleep apnea (adult) (pediatric): Secondary | ICD-10-CM | POA: Diagnosis not present

## 2020-03-29 DIAGNOSIS — J449 Chronic obstructive pulmonary disease, unspecified: Secondary | ICD-10-CM | POA: Diagnosis not present

## 2020-04-29 DIAGNOSIS — J449 Chronic obstructive pulmonary disease, unspecified: Secondary | ICD-10-CM | POA: Diagnosis not present

## 2020-04-29 DIAGNOSIS — G4733 Obstructive sleep apnea (adult) (pediatric): Secondary | ICD-10-CM | POA: Diagnosis not present

## 2020-05-01 ENCOUNTER — Other Ambulatory Visit: Payer: Self-pay | Admitting: Family Medicine

## 2020-05-01 DIAGNOSIS — J441 Chronic obstructive pulmonary disease with (acute) exacerbation: Secondary | ICD-10-CM

## 2020-05-01 DIAGNOSIS — R252 Cramp and spasm: Secondary | ICD-10-CM

## 2020-05-29 ENCOUNTER — Ambulatory Visit: Payer: Medicare HMO

## 2020-05-30 DIAGNOSIS — G4733 Obstructive sleep apnea (adult) (pediatric): Secondary | ICD-10-CM | POA: Diagnosis not present

## 2020-05-30 DIAGNOSIS — J449 Chronic obstructive pulmonary disease, unspecified: Secondary | ICD-10-CM | POA: Diagnosis not present

## 2020-06-01 ENCOUNTER — Telehealth: Payer: Self-pay | Admitting: Family Medicine

## 2020-06-01 NOTE — Telephone Encounter (Signed)
Copied from [place] [id number]. Topic: Medicare AWV >> Jun 01, 2020  1:28 PM Cher Nakai R wrote: Reason for CRM:   Left message for patient to call back and schedule Medicare Annual Wellness Visit (AWV) in office.   If unable to come into the office for AWV,  please offer to do virtually or by telephone.  Last AWV:  05/26/2019  Please schedule at anytime with [place].  40 minute appointment  Any questions, please contact me at [phone]

## 2020-06-12 ENCOUNTER — Ambulatory Visit: Payer: Medicare HMO | Admitting: Family Medicine

## 2020-06-12 ENCOUNTER — Other Ambulatory Visit: Payer: Self-pay

## 2020-06-12 ENCOUNTER — Ambulatory Visit: Payer: Medicare HMO | Admitting: Podiatry

## 2020-06-12 DIAGNOSIS — M79675 Pain in left toe(s): Secondary | ICD-10-CM | POA: Diagnosis not present

## 2020-06-12 DIAGNOSIS — B351 Tinea unguium: Secondary | ICD-10-CM | POA: Diagnosis not present

## 2020-06-12 DIAGNOSIS — M79674 Pain in right toe(s): Secondary | ICD-10-CM

## 2020-06-12 DIAGNOSIS — L989 Disorder of the skin and subcutaneous tissue, unspecified: Secondary | ICD-10-CM | POA: Diagnosis not present

## 2020-06-12 DIAGNOSIS — E0843 Diabetes mellitus due to underlying condition with diabetic autonomic (poly)neuropathy: Secondary | ICD-10-CM | POA: Diagnosis not present

## 2020-06-12 NOTE — Progress Notes (Signed)
   SUBJECTIVE Patient with a history of diabetes mellitus presents to office today complaining of elongated, thickened nails that cause pain while ambulating in shoes.  She is unable to trim her own nails. Patient is here for further evaluation and treatment.   Past Medical History:  Diagnosis Date  . Arthritis   . Asthma   . COPD (chronic obstructive pulmonary disease) ([place])   . Diabetes mellitus without complication ([place])   . Endometriosis   . GERD (gastroesophageal reflux disease)   . Hypertension   . Obesity   . Sleep apnea    CPAP    OBJECTIVE General Patient is awake, alert, and oriented x 3 and in no acute distress. Derm Skin is dry and supple bilateral. Negative open lesions or macerations. Remaining integument unremarkable. Nails are tender, long, thickened and dystrophic with subungual debris, consistent with onychomycosis, 1-5 bilateral. No signs of infection noted.  Hyperkeratotic preulcerative callus tissue also noted to the bilateral feet Vasc  DP and PT pedal pulses palpable bilaterally. Temperature gradient within normal limits.  Neuro Epicritic and protective threshold sensation diminished bilaterally.  Musculoskeletal Exam No symptomatic pedal deformities noted bilateral. Muscular strength within normal limits.  ASSESSMENT 1. Diabetes Mellitus w/ peripheral neuropathy 2. Onychomycosis of nail due to dermatophyte bilateral 3. Pain in foot bilateral 4.  Benign skin lesions/preulcerative calluses bilateral feet  PLAN OF CARE 1. Patient evaluated today. 2. Instructed to maintain good pedal hygiene and foot care. Stressed importance of controlling blood sugar.  3. Mechanical debridement of nails 1-5 bilaterally performed using a nail nipper. Filed with dremel without incident.  4.  Excisional debridement of the hyperkeratotic skin lesions was performed using a tissue nipper without incident or bleeding  5.  Return to clinic in 3 mos.     Edrick Kins,  DPM [facility]  Dr. Edrick Kins, DPM    [address], [place] [address]                Office [phone]  Fax [phone]

## 2020-07-18 ENCOUNTER — Telehealth: Payer: Self-pay | Admitting: Family Medicine

## 2020-07-18 NOTE — Telephone Encounter (Signed)
Copied from [place] [id number]. Topic: Medicare AWV >> Jul 18, 2020  2:44 PM Cher Nakai R wrote: Reason for CRM:  Left message for  patient to call back and schedule Medicare Annual Wellness Visit (AWV) in office.   If unable to come into the office for AWV,  please offer to do virtually or by telephone.  Last AWV:  05/26/2019  Please schedule at anytime with [place].  40 minute appointment  Any questions, please contact me at [phone]

## 2020-07-23 ENCOUNTER — Telehealth: Payer: Self-pay | Admitting: Family Medicine

## 2020-07-23 NOTE — Telephone Encounter (Signed)
Copied from [place] [phone]. Topic: Medicare AWV >> Jul 23, 2020 10:15 AM Cher Nakai R wrote: Reason for CRM:   Left message for patient to call back and schedule Medicare Annual Wellness Visit (AWV) in office.   If unable to come into the office for AWV,  please offer to do virtually or by telephone.  Last AWV:  05/26/2019  Please schedule at anytime with [place].  40 minute appointment  Any questions, please contact me at [phone]

## 2020-07-30 ENCOUNTER — Telehealth: Payer: Self-pay | Admitting: Family Medicine

## 2020-07-30 NOTE — Telephone Encounter (Signed)
Patient returned call and scheduled for 08/09/2020 at 3:30pm. Patient states she would like to discuss her feet being cold. Please follow up with the patient if symptoms can not be discussed at the time of appointment.   Copied from [place] [id number]. Topic: Medicare AWV >> Jul 23, 2020 10:15 AM Cher Nakai R wrote: Reason for CRM:   Left message for patient to call back and schedule Medicare Annual Wellness Visit (AWV) in office.   If unable to come into the office for AWV,  please offer to do virtually or by telephone.  Last AWV:  05/26/2019  Please schedule at anytime with [facility].  40 minute appointment  Any questions, please contact me at [phone]

## 2020-07-31 ENCOUNTER — Telehealth (INDEPENDENT_AMBULATORY_CARE_PROVIDER_SITE_OTHER): Payer: Medicare HMO | Admitting: Family Medicine

## 2020-07-31 ENCOUNTER — Encounter: Payer: Self-pay | Admitting: Family Medicine

## 2020-07-31 ENCOUNTER — Other Ambulatory Visit: Payer: Self-pay

## 2020-07-31 DIAGNOSIS — J310 Chronic rhinitis: Secondary | ICD-10-CM

## 2020-07-31 DIAGNOSIS — J441 Chronic obstructive pulmonary disease with (acute) exacerbation: Secondary | ICD-10-CM

## 2020-07-31 DIAGNOSIS — J329 Chronic sinusitis, unspecified: Secondary | ICD-10-CM

## 2020-07-31 MED ORDER — BENZONATATE 100 MG PO CAPS: 100.0000 mg | ORAL_CAPSULE | Freq: Three times a day (TID) | ORAL | 0 refills | Status: DC | PRN

## 2020-07-31 MED ORDER — AZELASTINE HCL 0.1 % NA SOLN: 2.0000 | Freq: Two times a day (BID) | NASAL | 1 refills | Status: DC | PRN

## 2020-07-31 MED ORDER — ZYRTEC ALLERGY 10 MG PO TBDP: 10.0000 mg | ORAL_TABLET | Freq: Every day | ORAL | 1 refills | Status: DC

## 2020-07-31 MED ORDER — DOXYCYCLINE HYCLATE 100 MG PO TABS: 100.0000 mg | ORAL_TABLET | Freq: Two times a day (BID) | ORAL | 0 refills | Status: AC

## 2020-07-31 MED ORDER — PREDNISONE 10 MG (21) PO TBPK: ORAL_TABLET | ORAL | 0 refills | Status: DC

## 2020-07-31 NOTE — Progress Notes (Signed)
Name: Alexandra Foster   MRN: [id number]    DOB: [birth year]   Date:07/31/2020       Progress Note  Subjective:    Chief Complaint  Chief Complaint  Patient presents with  . Sinusitis    Cough, congested, head stuffy for 1 week  . Allergic Rhinitis     I connected with  Judith Blonder Kleiman on 07/31/20 at 11:00 AM EDT by telephone and verified that I am speaking with the correct person using two identifiers.   I discussed the limitations, risks, security and privacy concerns of performing an evaluation and management service by telephone and the availability of in person appointments. Staff also discussed with the patient that there may be a patient responsible charge related to this service.  Patient verbalized understanding and agreed to proceed with encounter. Patient Location:  car Provider Location:  [facility] clinic office  Additional Individuals present:  none  Sinusitis This is a recurrent problem. The current episode started in the past 7 days. The problem has been gradually worsening since onset. There has been no fever. Associated symptoms include congestion, ear pain and sinus pressure. Pertinent negatives include no chills, coughing, diaphoresis, headaches, hoarse voice, neck pain, shortness of breath, sneezing, sore throat or swollen glands. Past treatments include nothing.   Pt presents with worse nasal sx and congestion exacerbated with heavy pollen  Last night had an ear ache - but she put some sweet oil in it and it felt better. Pressure behind eyes No HA, fever, sore throat, coughing, facial pain, H/C chills, sweats, wheeze, SOB Does endorse generalized fatigue   She has DM, HTN, HLD and COPD - on singulair and trelegy, she reports hx of sinus surgery and recurrent sinus infections at least once a year - usually gets steroids and an antibiotic   She is not on an antihistamine or any nasal sprays - she says they don't work. She doesn't want augmentin - causes vaginal  sx Hasn't tried any decongestants She wants refill on tessalon     Patient Active Problem List   Diagnosis Date Noted  . Personal history of COVID-19 07/14/2019  . Bilateral lower extremity edema 05/03/2018  . Lower extremity pain, bilateral 05/03/2018  . Diabetes mellitus type 2 in obese ([place]) 04/23/2018  . Osteoarthritis of both knees 01/14/2018  . Urge incontinence 03/14/2016  . Osteopenia 02/13/2016  . OSA (obstructive sleep apnea) 01/08/2016  . Allergic rhinitis 04/13/2015  . COPD (chronic obstructive pulmonary disease) ([place]) 09/18/2014  . Morbid obesity ([place]) 09/18/2014  . Esophagitis, reflux 09/18/2014  . Essential hypertension 09/21/2006    Social History   Tobacco Use  . Smoking status: Former Smoker    Packs/day: 2.00    Years: 40.00    Pack years: 80.00    Types: Cigarettes    Quit date: 2008    Years since quitting: 14.3  . Smokeless tobacco: Former Systems developer    Types: Snuff    Quit date: 04/2001  . Tobacco comment: smoking cessation materials not required  Substance Use Topics  . Alcohol use: No    Alcohol/week: 0.0 standard drinks     Current Outpatient Medications:  .  albuterol (PROVENTIL) (2.5 MG/3ML) 0.083% nebulizer solution, Take 3 mLs (2.5 mg total) by nebulization every 6 (six) hours as needed for wheezing or shortness of breath., Disp: 150 mL, Rfl: 1 .  aspirin 81 MG tablet, Take 1 tablet (81 mg total) by mouth daily., Disp: 30 tablet, Rfl: 0 .  atorvastatin (LIPITOR) 40 MG tablet, TAKE 1 TABLET EVERY DAY, Disp: 90 tablet, Rfl: 1 .  baclofen (LIORESAL) 10 MG tablet, TAKE 1 TABLET DAILY AS NEEDED FOR MUSCLE SPASMS, Disp: 60 tablet, Rfl: 0 .  calcium carbonate (OSCAL) 1500 (600 Ca) MG TABS tablet, Take 600 mg of elemental calcium by mouth 2 (two) times daily with a meal., Disp: , Rfl:  .  docusate sodium (COLACE) 100 MG capsule, Take 100 mg by mouth daily., Disp: , Rfl:  .  Fluticasone Propionate (XHANCE NA), Place 1 puff into the nose in the morning  and at bedtime., Disp: , Rfl:  .  Fluticasone-Umeclidin-Vilant (TRELEGY ELLIPTA) 100-62.5-25 MCG/INH AEPB, Inhale 1 puff into the lungs daily. In place of Stiolto, Disp: 180 each, Rfl: 1 .  metFORMIN (GLUCOPHAGE-XR) 750 MG 24 hr tablet, Take 1 tablet (750 mg total) by mouth daily with breakfast., Disp: 90 tablet, Rfl: 1 .  montelukast (SINGULAIR) 10 MG tablet, TAKE 1 TABLET AT BEDTIME, Disp: 90 tablet, Rfl: 1 .  Multiple Vitamins-Minerals (MULTIVITAL PO), Take 1 tablet by mouth daily., Disp: , Rfl:  .  predniSONE (DELTASONE) 10 MG tablet, , Disp: , Rfl:  .  Thiamine HCl (VITAMIN B-1) 250 MG tablet, Take 250 mg by mouth daily., Disp: , Rfl:  .  triamterene-hydrochlorothiazide (DYAZIDE) 37.5-25 MG capsule, TAKE 1 CAPSULE EVERY DAY, Disp: 90 capsule, Rfl: 1 .  vitamin B-12 (CYANOCOBALAMIN) 100 MCG tablet, Take 100 mcg by mouth daily., Disp: , Rfl:  .  benzonatate (TESSALON PERLES) 100 MG capsule, Take 1-2 capsules (100-200 mg total) by mouth 3 (three) times daily as needed. (Patient not taking: Reported on 07/31/2020), Disp: 90 capsule, Rfl: 0 .  Blood Glucose Calibration (ACCU-CHEK AVIVA) SOLN, 1 each by In Vitro route daily. (Patient not taking: Reported on 07/31/2020), Disp: 1 each, Rfl: 0 .  Blood Glucose Monitoring Suppl (ACCU-CHEK AVIVA PLUS) w/Device KIT, 1 each by Other route See admin instructions. (Patient not taking: Reported on 07/31/2020), Disp: 1 kit, Rfl: 0 .  glucose blood (ACCU-CHEK AVIVA PLUS) test strip, Use as instructed (Patient not taking: Reported on 07/31/2020), Disp: 100 each, Rfl: 12 .  Lancets (ACCU-CHEK SOFT TOUCH) lancets, Use as instructed (Patient not taking: Reported on 07/31/2020), Disp: 100 each, Rfl: 12 .  omeprazole (PRILOSEC) 20 MG capsule, Take 1 capsule (20 mg total) by mouth daily. (Patient not taking: Reported on 07/31/2020), Disp: 90 capsule, Rfl: 1  No Known Allergies  Chart Review: I personally reviewed active problem list, medication list, allergies, family  history, social history, health maintenance, notes from last encounter, lab results, imaging with the patient/caregiver today.   Review of Systems  Constitutional: Negative.  Negative for activity change, appetite change, chills, diaphoresis, fever and unexpected weight change.  HENT: Positive for congestion, ear pain, postnasal drip, rhinorrhea and sinus pressure. Negative for drooling, ear discharge, facial swelling, hoarse voice, sinus pain, sneezing, sore throat, trouble swallowing and voice change.   Eyes: Negative.   Respiratory: Negative.  Negative for cough and shortness of breath.   Cardiovascular: Negative.   Gastrointestinal: Negative.   Endocrine: Negative.   Genitourinary: Negative.   Musculoskeletal: Negative.  Negative for neck pain.  Skin: Negative.   Allergic/Immunologic: Negative.   Neurological: Negative.  Negative for dizziness and headaches.  Hematological: Negative.   Psychiatric/Behavioral: Negative.   All other systems reviewed and are negative.    Objective:    Virtual encounter, vitals limited, only able to obtain the following There were no vitals filed for  this visit. There is no height or weight on file to calculate BMI. Nursing Note and Vital Signs reviewed.  Physical Exam Vitals and nursing note reviewed.  Pulmonary:     Effort: No respiratory distress.  Neurological:     Mental Status: She is alert.     PE limited by telephone encounter  No results found for this or any previous visit (from the past 72 hour(s)).  Assessment and Plan:     ICD-10-CM   1. Rhinosinusitis  J31.0 predniSONE (STERAPRED UNI-PAK 21 TAB) 10 MG (21) TBPK tablet   J32.9 doxycycline (VIBRA-TABS) 100 MG tablet    Cetirizine HCl (ZYRTEC ALLERGY) 10 MG TBDP    azelastine (ASTELIN) 0.1 % nasal spray   seems to be triggered by allergies, hx of surgery and recurrent infections, worsening pain x 7 d behind eyes and severe congestion  2. Chronic obstructive pulmonary  disease with acute exacerbation (HCC)  J44.1 benzonatate (TESSALON PERLES) 100 MG capsule    predniSONE (STERAPRED UNI-PAK 21 TAB) 10 MG (21) TBPK tablet    doxycycline (VIBRA-TABS) 100 MG tablet   currently COPD sx have been at her baseline, steroids and doxy if any worsening with URI sx, she requests refill on tessalon   Instructions to pt:  Suggest using a once a day antihistamine like generic and/or over the counter zyrtec, claritin, allegra or xyzal and doing a steroid nasal spray and nasal saline in addition to avoiding pollen.  If tolerated you can also try decongestants or other cold or allergy meds over the counter.  If the prescribed nasal spray is too expensive you may want to look it up on Goodrx.com or not pay for it and instead use flonase, nasonex, or nasocort - or generic equivalent.  I do believe you need to treat the allergy symptoms and if you do not then the steroids may help for a few days but symptoms may be likely to return again with spring and continued allergens.    Please follow up if not improving in the next 1-2 weeks.   -Red flags and when to present for emergency care or RTC including but not limited to new/worsening/un-resolving symptoms,  reviewed with patient at time of visit. Follow up and care instructions discussed and provided in AVS. - I discussed the assessment and treatment plan with the patient. The patient was provided an opportunity to ask questions and all were answered. The patient agreed with the plan and demonstrated an understanding of the instructions.  - The patient was advised to call back or seek an in-person evaluation if the symptoms worsen or if the condition fails to improve as anticipated.  I provided 23 minutes of non-face-to-face time during this encounter, 17 min on the phone with pt and additional 5+ for documentation, chart review, prescribing meds and arranging f/up plan with staff.  Delsa Grana, PA-C 07/31/20 11:20 AM

## 2020-07-31 NOTE — Patient Instructions (Signed)
Suggest using a once a day antihistamine like generic and/or over the counter zyrtec, claritin, allegra or xyzal and doing a steroid nasal spray and nasal saline in addition to avoiding pollen.  If tolerated you can also try decongestants or other cold or allergy meds over the counter.  If the prescribed nasal spray is too expensive you may want to look it up on Goodrx.com or not pay for it and instead use flonase, nasonex, or nasocort - or generic equivalent.  I do believe you need to treat the allergy symptoms and if you do not then the steroids may help for a few days but symptoms may be likely to return again with spring and continued allergens.    Please follow up if not improving in the next 1-2 weeks.    Allergic Rhinitis, Adult Allergic rhinitis is a reaction to allergens. Allergens are things that can cause an allergic reaction. This condition affects the lining inside the nose (mucous membrane). There are two types of allergic rhinitis:  Seasonal. This type is also called hay fever. It happens only during some times of the year.  Perennial. This type can happen at any time of the year. This condition cannot be spread from person to person (is not contagious). It can be mild, worse, or very bad. It can develop at any age and may be outgrown. What are the causes? This condition may be caused by:  Pollen from grasses, trees, and weeds.  Dust mites.  Smoke.  Mold.  Car fumes.  The pee (urine), spit, or dander of pets. Dander is dead skin cells from a pet.   What increases the risk? You are more likely to develop this condition if:  You have allergies in your family.  You have problems like allergies in your family. You may have: ? Swelling of parts of your eyes and eyelids. ? Asthma. This affects how you breathe. ? Long-term redness and swelling on your skin. ? Food allergies. What are the signs or symptoms? The main symptom of this condition is a runny or stuffy  nose (nasal congestion). Other symptoms may include:  Sneezing or coughing.  Itching and tearing of your eyes.  Mucus that drips down the back of your throat (postnasal drip).  Trouble sleeping.  Feeling tired.  Headache.  Sore throat. How is this treated? There is no cure for this condition. You should avoid things that you are allergic to. Treatment can help to relieve symptoms. This may include:  Medicines that block allergy symptoms, such as corticosteroids or antihistamines. These may be given as a shot, nasal spray, or pill.  Avoiding things you are allergic to.  Medicines that give you bits of what you are allergic to over time. This is called immunotherapy. It is done if other treatments do not help. You may get: ? Shots. ? Medicine under your tongue.  Stronger medicines, if other treatments do not help. Follow these instructions at home: Avoiding allergens Find out what things you are allergic to and avoid them. To do this, try these things:  If you get allergies any time of year: ? Replace carpet with wood, tile, or vinyl flooring. Carpet can trap pet dander and dust. ? Do not smoke. Do not allow smoking in your home. ? Change your heating and air conditioning filters at least once a month.  If you get allergies only some times of the year: ? Keep windows closed when you can. ? Plan things to do outside when  pollen counts are lowest. Check pollen counts before you plan things to do outside. ? When you come indoors, change your clothes and shower before you sit on furniture or bedding.   If you are allergic to a pet: ? Keep the pet out of your bedroom. ? Vacuum, sweep, and dust often.   General instructions  Take over-the-counter and prescription medicines only as told by your doctor.  Drink enough fluid to keep your pee (urine) pale yellow.  Keep all follow-up visits as told by your doctor. This is important. Where to find more information  American  Academy of All[employer], Asthma & Immunology: www.aaaai.org Contact a doctor if:  You have a fever.  You get a cough that does not go away.  You make whistling sounds when you breathe (wheeze).  Your symptoms slow you down.  Your symptoms stop you from doing your normal things each day. Get help right away if:  You are short of breath. This symptom may be an emergency. Do not wait to see if the symptom will go away. Get medical help right away. Call your local emergency services (911 in the [place] Do not drive yourself to the hospital. Summary  Allergic rhinitis may be treated by taking medicines and avoiding things you are allergic to.  If you have allergies only some of the year, keep windows closed when you can at those times.  Contact your doctor if you get a fever or a cough that does not go away. This information is not intended to replace advice given to you by your health care provider. Make sure you discuss any questions you have with your health care provider. Document Revised: 05/23/2019 Document Reviewed: 03/29/2019 Elsevier Patient Education  2021 [employer].   Sinusitis, Adult Sinusitis is soreness and swelling (inflammation) of your sinuses. Sinuses are hollow spaces in the bones around your face. They are located:  Around your eyes.  In the middle of your forehead.  Behind your nose.  In your cheekbones. Your sinuses and nasal passages are lined with a fluid called mucus. Mucus drains out of your sinuses. Swelling can trap mucus in your sinuses. This lets germs (bacteria, virus, or fungus) grow, which leads to infection. Most of the time, this condition is caused by a virus. What are the causes? This condition is caused by:  Allergies.  Asthma.  Germs.  Things that block your nose or sinuses.  Growths in the nose (nasal polyps).  Chemicals or irritants in the air.  Fungus (rare). What increases the risk? You are more likely to develop this  condition if:  You have a weak body defense system (immune system).  You do a lot of swimming or diving.  You use nasal sprays too much.  You smoke. What are the signs or symptoms? The main symptoms of this condition are pain and a feeling of pressure around the sinuses. Other symptoms include:  Stuffy nose (congestion).  Runny nose (drainage).  Swelling and warmth in the sinuses.  Headache.  Toothache.  A cough that may get worse at night.  Mucus that collects in the throat or the back of the nose (postnasal drip).  Being unable to smell and taste.  Being very tired (fatigue).  A fever.  Sore throat.  Bad breath. How is this diagnosed? This condition is diagnosed based on:  Your symptoms.  Your medical history.  A physical exam.  Tests to find out if your condition is short-term (acute) or long-term (chronic). Your  doctor may: ? Check your nose for growths (polyps). ? Check your sinuses using a tool that has a light (endoscope). ? Check for allergies or germs. ? Do imaging tests, such as an MRI or CT scan. How is this treated? Treatment for this condition depends on the cause and whether it is short-term or long-term.  If caused by a virus, your symptoms should go away on their own within 10 days. You may be given medicines to relieve symptoms. They include: ? Medicines that shrink swollen tissue in the nose. ? Medicines that treat allergies (antihistamines). ? A spray that treats swelling of the nostrils. ? Rinses that help get rid of thick mucus in your nose (nasal saline washes).  If caused by bacteria, your doctor may wait to see if you will get better without treatment. You may be given antibiotic medicine if you have: ? A very bad infection. ? A weak body defense system.  If caused by growths in the nose, you may need to have surgery. Follow these instructions at home: Medicines  Take, use, or apply over-the-counter and prescription medicines  only as told by your doctor. These may include nasal sprays.  If you were prescribed an antibiotic medicine, take it as told by your doctor. Do not stop taking the antibiotic even if you start to feel better. Hydrate and humidify  Drink enough water to keep your pee (urine) pale yellow.  Use a cool mist humidifier to keep the humidity level in your home above 50%.  Breathe in steam for 10-15 minutes, 3-4 times a day, or as told by your doctor. You can do this in the bathroom while a hot shower is running.  Try not to spend time in cool or dry air.   Rest  Rest as much as you can.  Sleep with your head raised (elevated).  Make sure you get enough sleep each night. General instructions  Put a warm, moist washcloth on your face 3-4 times a day, or as often as told by your doctor. This will help with discomfort.  Wash your hands often with soap and water. If there is no soap and water, use hand sanitizer.  Do not smoke. Avoid being around people who are smoking (secondhand smoke).  Keep all follow-up visits as told by your doctor. This is important.   Contact a doctor if:  You have a fever.  Your symptoms get worse.  Your symptoms do not get better within 10 days. Get help right away if:  You have a very bad headache.  You cannot stop throwing up (vomiting).  You have very bad pain or swelling around your face or eyes.  You have trouble seeing.  You feel confused.  Your neck is stiff.  You have trouble breathing. Summary  Sinusitis is swelling of your sinuses. Sinuses are hollow spaces in the bones around your face.  This condition is caused by tissues in your nose that become inflamed or swollen. This traps germs. These can lead to infection.  If you were prescribed an antibiotic medicine, take it as told by your doctor. Do not stop taking it even if you start to feel better.  Keep all follow-up visits as told by your doctor. This is important. This information  is not intended to replace advice given to you by your health care provider. Make sure you discuss any questions you have with your health care provider. Document Revised: 08/31/2017 Document Reviewed: 08/31/2017 Elsevier Patient Education  2021 Elsevier  Inc.  

## 2020-08-01 ENCOUNTER — Telehealth: Payer: Self-pay | Admitting: Family Medicine

## 2020-08-01 DIAGNOSIS — J302 Other seasonal allergic rhinitis: Secondary | ICD-10-CM

## 2020-08-01 DIAGNOSIS — J3089 Other allergic rhinitis: Secondary | ICD-10-CM

## 2020-08-01 DIAGNOSIS — R252 Cramp and spasm: Secondary | ICD-10-CM

## 2020-08-01 NOTE — Telephone Encounter (Signed)
Requested medications are due for refill today.  yes  Requested medications are on the active medications list.  yes  Last refill. 05/01/2020  Future visit scheduled.   yes  Notes to clinic.  Medication not delegated.

## 2020-08-01 NOTE — Telephone Encounter (Signed)
Requested Prescriptions  Pending Prescriptions Disp Refills  . montelukast (SINGULAIR) 10 MG tablet [Pharmacy Med Name: MONTELUKAST SODIUM 10 MG Tablet] 90 tablet 1    Sig: TAKE 1 TABLET AT BEDTIME     Pulmonology:  Leukotriene Inhibitors Passed - 08/01/2020  1:04 AM      Passed - Valid encounter within last 12 months    Recent Outpatient Visits          [facility] Delsa Grana, PA-C   4 months ago Dyslipidemia associated with type 2 diabetes mellitus [facility]   [facility] [place], Drue Stager, MD   5 months ago Diabetes mellitus type 2 in obese [facility])   [facility] [place], Drue Stager, MD   9 months ago Chronic obstructive pulmonary disease with acute exacerbation [facility])   [facility] Delsa Grana, PA-C   11 months ago Primary osteoarthritis of both knees   [facility] [place], Drue Stager, MD             . baclofen (LIORESAL) 10 MG tablet [Pharmacy Med Name: BACLOFEN 10 MG Tablet] 60 tablet 0    Sig: TAKE 1 TABLET DAILY AS NEEDED FOR MUSCLE SPASMS     Not Delegated - Analgesics:  Muscle Relaxants Failed - 08/01/2020  1:04 AM      Failed - This refill cannot be delegated      Passed - Valid encounter within last 6 months    Recent Outpatient Visits          [facility] Delsa Grana, PA-C   4 months ago Dyslipidemia associated with type 2 diabetes mellitus [facility])   [facility] [place], Drue Stager, MD   5 months ago Diabetes mellitus type 2 in obese [facility])   [facility] Steele Sizer, MD   9 months ago Chronic obstructive pulmonary disease with acute exacerbation [facility])   [facility] Delsa Grana, PA-C   11 months ago Primary osteoarthritis of both knees   [facility] Steele Sizer, MD

## 2020-08-02 NOTE — Telephone Encounter (Signed)
appt scheduled for 5.13.2022 with Dr Ancil Boozer. Pt aware of medication being sent to pharmacy

## 2020-08-09 ENCOUNTER — Ambulatory Visit (INDEPENDENT_AMBULATORY_CARE_PROVIDER_SITE_OTHER): Payer: Medicare HMO

## 2020-08-09 VITALS — BP 112/72 | HR 87 | Temp 98.3°F | Resp 16 | Ht 65.0 in | Wt 265.5 lb

## 2020-08-09 DIAGNOSIS — Z0000 Encounter for general adult medical examination without abnormal findings: Secondary | ICD-10-CM | POA: Diagnosis not present

## 2020-08-09 NOTE — Progress Notes (Signed)
 Subjective:   Alexandra Foster is a 69 y.o. female who presents for Medicare Annual (Subsequent) preventive examination.  Review of Systems     Cardiac Risk Factors include: advanced age (>55men, >65 women);diabetes mellitus;dyslipidemia;hypertension;sedentary lifestyle;obesity (BMI >30kg/m2)     Objective:    Today's Vitals   08/09/20 1531 08/09/20 1532  BP: 112/72   Pulse: 87   Resp: 16   Temp: 98.3 F (36.8 C)   TempSrc: Oral   SpO2: 95%   Weight: 265 lb 8 oz (120.4 kg)   Height: 5' 5" (1.651 m)   PainSc:  8    Body mass index is 44.18 kg/m.  Advanced Directives 08/09/2020 04/30/2019 04/29/2019 04/22/2018 11/17/2017 04/21/2017 11/05/2016  Does Patient Have a Medical Advance Directive? No Yes Yes No Yes No No  Type of Advance Directive - Living will Living will - Healthcare Power of Attorney;Living will - -  Does patient want to make changes to medical advance directive? - No - Patient declined - - - - -  Would patient like information on creating a medical advance directive? Yes (MAU/Ambulatory/Procedural Areas - Information given) - No - Patient declined Yes (MAU/Ambulatory/Procedural Areas - Information given) - Yes (MAU/Ambulatory/Procedural Areas - Information given) No - Patient declined    Current Medications (verified) Outpatient Encounter Medications as of 08/09/2020  Medication Sig  . albuterol (PROVENTIL) (2.5 MG/3ML) 0.083% nebulizer solution Take 3 mLs (2.5 mg total) by nebulization every 6 (six) hours as needed for wheezing or shortness of breath.  . aspirin 81 MG tablet Take 1 tablet (81 mg total) by mouth daily.  . atorvastatin (LIPITOR) 40 MG tablet TAKE 1 TABLET EVERY DAY  . azelastine (ASTELIN) 0.1 % nasal spray Place 2 sprays into both nostrils 2 (two) times daily as needed for rhinitis or allergies.  . baclofen (LIORESAL) 10 MG tablet TAKE 1 TABLET DAILY AS NEEDED FOR MUSCLE SPASMS  . Blood Glucose Monitoring Suppl (ACCU-CHEK AVIVA PLUS) w/Device KIT 1 each  by Other route See admin instructions.  . calcium carbonate (OSCAL) 1500 (600 Ca) MG TABS tablet Take 600 mg of elemental calcium by mouth 2 (two) times daily with a meal.  . Cetirizine HCl (ZYRTEC ALLERGY) 10 MG TBDP Take 10 mg by mouth at bedtime. For sinus symptoms and seasonal allergies  . Fluticasone-Umeclidin-Vilant (TRELEGY ELLIPTA) 100-62.5-25 MCG/INH AEPB Inhale 1 puff into the lungs daily. In place of Stiolto  . glucose blood (ACCU-CHEK AVIVA PLUS) test strip Use as instructed  . Lancets (ACCU-CHEK SOFT TOUCH) lancets Use as instructed  . metFORMIN (GLUCOPHAGE-XR) 750 MG 24 hr tablet Take 1 tablet (750 mg total) by mouth daily with breakfast.  . montelukast (SINGULAIR) 10 MG tablet TAKE 1 TABLET AT BEDTIME  . triamterene-hydrochlorothiazide (DYAZIDE) 37.5-25 MG capsule TAKE 1 CAPSULE EVERY DAY  . vitamin B-12 (CYANOCOBALAMIN) 100 MCG tablet Take 100 mcg by mouth daily.  . benzonatate (TESSALON PERLES) 100 MG capsule Take 1-2 capsules (100-200 mg total) by mouth 3 (three) times daily as needed. (Patient not taking: Reported on 08/09/2020)  . docusate sodium (COLACE) 100 MG capsule Take 100 mg by mouth daily. (Patient not taking: Reported on 08/09/2020)  . Multiple Vitamins-Minerals (MULTIVITAL PO) Take 1 tablet by mouth daily. (Patient not taking: Reported on 08/09/2020)  . omeprazole (PRILOSEC) 20 MG capsule Take 1 capsule (20 mg total) by mouth daily. (Patient not taking: No sig reported)  . predniSONE (STERAPRED UNI-PAK 21 TAB) 10 MG (21) TBPK tablet Take as directed on package.  (  60 mg po on day 1, 50 mg po on day 2...) (Patient taking differently: Take as directed on package.  (60 mg po on day 1, 50 mg po on day 2...))  . Thiamine HCl (VITAMIN B-1) 250 MG tablet Take 250 mg by mouth daily. (Patient not taking: Reported on 08/09/2020)  . [DISCONTINUED] Blood Glucose Calibration (ACCU-CHEK AVIVA) SOLN 1 each by In Vitro route daily. (Patient not taking: Reported on 07/31/2020)   No  facility-administered encounter medications on file as of 08/09/2020.    Allergies (verified) Patient has no known allergies.   History: Past Medical History:  Diagnosis Date  . Arthritis   . Asthma   . COPD (chronic obstructive pulmonary disease) ([place])   . Diabetes mellitus without complication ([place])   . Endometriosis   . GERD (gastroesophageal reflux disease)   . Hypertension   . Obesity   . Sleep apnea    CPAP   Past Surgical History:  Procedure Laterality Date  . ABDOMINAL HYSTERECTOMY    . COLONOSCOPY  10/29/2006   Dr Allen Norris  . COLONOSCOPY WITH PROPOFOL N/A 11/05/2016   Procedure: COLONOSCOPY WITH PROPOFOL;  Surgeon: Robert Bellow, MD;  Location: [facility] ENDOSCOPY;  Service: Endoscopy;  Laterality: N/A;  . NASAL SINUS SURGERY  2002   Dr Carlis Abbott   Family History  Problem Relation Age of Onset  . Congestive Heart Failure Mother   . Coronary artery disease Father   . Breast cancer Sister 59  . Heart disease Brother   . Varicose Veins Brother   . Alcohol abuse Brother    Social History   Socioeconomic History  . Marital status: Married    Spouse name: Trilby Drummer  . Number of children: 1  . Years of education: 56  . Highest education level: 12th grade  Occupational History    Employer: RETIRED  Tobacco Use  . Smoking status: Former Smoker    Packs/day: 2.00    Years: 40.00    Pack years: 80.00    Types: Cigarettes    Quit date: 2008    Years since quitting: 14.3  . Smokeless tobacco: Former Systems developer    Types: Snuff    Quit date: 04/2001  . Tobacco comment: smoking cessation materials not required  Vaping Use  . Vaping Use: Never used  Substance and Sexual Activity  . Alcohol use: No    Alcohol/week: 0.0 standard drinks  . Drug use: No  . Sexual activity: Not Currently  Other Topics Concern  . Not on file  Social History Narrative  . Not on file   Social Determinants of Health   Financial Resource Strain: Medium Risk  . Difficulty of Paying Living  Expenses: Somewhat hard  Food Insecurity: No Food Insecurity  . Worried About Charity fundraiser in the Last Year: Never true  . Ran Out of Food in the Last Year: Never true  Transportation Needs: No Transportation Needs  . Lack of Transportation (Medical): No  . Lack of Transportation (Non-Medical): No  Physical Activity: Inactive  . Days of Exercise per Week: 0 days  . Minutes of Exercise per Session: 0 min  Stress: No Stress Concern Present  . Feeling of Stress : Not at all  Social Connections: Moderately Integrated  . Frequency of Communication with Friends and Family: More than three times a week  . Frequency of Social Gatherings with Friends and Family: More than three times a week  . Attends Religious Services: More than 4 times per year  .  Active Member of Clubs or Organizations: No  . Attends Archivist Meetings: Never  . Marital Status: Married    Tobacco Counseling Counseling given: Not Answered Comment: smoking cessation materials not required   Clinical Intake:  Pre-visit preparation completed: Yes  Pain : 0-10 Pain Score: 8  Pain Type: Chronic pain Pain Location: Knee Pain Orientation: Right,Left Pain Descriptors / Indicators: Aching,Sore Pain Onset: More than a month ago Pain Frequency: Constant     BMI - recorded: 44.18 Nutritional Status: BMI > 30  Obese Nutritional Risks: None Diabetes: Yes CBG done?: No Did pt. bring in CBG monitor from home?: No  How often do you need to have someone help you when you read instructions, pamphlets, or other written materials from your doctor or pharmacy?: 1 - Never  Nutrition Risk Assessment:  Has the patient had any N/V/D within the last 2 months?  No  Does the patient have any non-healing wounds?  No  Has the patient had any unintentional weight loss or weight gain?  No   Diabetes:  Is the patient diabetic?  Yes  If diabetic, was a CBG obtained today?  No  Did the patient bring in their  glucometer from home?  No  How often do you monitor your CBG's? Occasionally per patient.   Financial Strains and Diabetes Management:  Are you having any financial strains with the device, your supplies or your medication? No .  Does the patient want to be seen by Chronic Care Management for management of their diabetes?  No  Would the patient like to be referred to a Nutritionist or for Diabetic Management?  No   Diabetic Exams:  Diabetic Eye Exam: Completed 08/11/17 negative retinopathy. Overdue for diabetic eye exam. Pt has been advised about the importance in completing this exam.   Diabetic Foot Exam: Completed 07/14/19. Pt has been advised about the importance in completing this exam. Pt is scheduled for diabetic foot exam on 08/24/20.    Interpreter Needed?: No  Information entered by :: Clemetine Marker LPN   Activities of Daily Living In your present state of health, do you have any difficulty performing the following activities: 08/09/2020 07/31/2020  Hearing? N N  Comment declines hearing aids -  Vision? N N  Difficulty concentrating or making decisions? N N  Walking or climbing stairs? Y Y  Dressing or bathing? N N  Doing errands, shopping? N N  Preparing Food and eating ? N -  Using the Toilet? N -  In the past six months, have you accidently leaked urine? Y -  Comment wears pads for protection -  Do you have problems with loss of bowel control? N -  Managing your Medications? N -  Managing your Finances? N -  Housekeeping or managing your Housekeeping? N -  Some recent data might be hidden    Patient Care Team: Steele Sizer, MD as PCP - General (Family Medicine) Star Age, MD as Consulting Physician (Neurology) Carloyn Manner, MD as Referring Physician (Otolaryngology) Cathi Roan, [facility] (Pharmacist)  Indicate any recent Medical Services you may have received from other than [facility] providers in the past year (date may be approximate).     Assessment:    This is a routine wellness examination for [place].  Hearing/Vision screen  Hearing Screening   125Hz 250Hz 500Hz 1000Hz 2000Hz 3000Hz 4000Hz 6000Hz 8000Hz  Right ear:           Left ear:  Comments: Pt denies hearing difficulty  Vision Screening Comments: Annual vision screenings done by Dr. Gloriann Loan  Dietary issues and exercise activities discussed: Current Exercise Habits: The patient does not participate in regular exercise at present, Exercise limited by: orthopedic condition(s)  Goals    . DIET - INCREASE WATER INTAKE     Recommend drinking 6-8 glasses of water per day     . Exercise 150 min/wk Moderate Activity     Recommend to exercise at least 150 minutes per week.       Depression Screen PHQ 2/9 Scores 08/09/2020 07/31/2020 03/06/2020 02/10/2020 10/27/2019 08/17/2019 07/14/2019  PHQ - 2 Score 0 0 2 0 0 0 0  PHQ- 9 Score - - 2 - 0 0 0    Fall Risk Fall Risk  08/09/2020 07/31/2020 03/06/2020 02/10/2020 10/27/2019  Falls in the past year? 0 0 0 0 0  Number falls in past yr: 0 0 0 0 0  Injury with Fall? 0 0 0 0 0  Risk for fall due to : No Fall Risks - - - -  Follow up Falls prevention discussed Falls evaluation completed - - Falls evaluation completed    [place]:  Any stairs in or around the home? Yes  If so, are there any without handrails? Yes  - 3 steps outside Home free of loose throw rugs in walkways, pet beds, electrical cords, etc? Yes  Adequate lighting in your home to reduce risk of falls? Yes   ASSISTIVE DEVICES UTILIZED TO PREVENT FALLS:  Life alert? No  Use of a cane, walker or w/c? No  Grab bars in the bathroom? No  Shower chair or bench in shower? No  Elevated toilet seat or a handicapped toilet? No   TIMED UP AND GO:  Was the test performed? Yes .  Length of time to ambulate 10 feet: 5 sec.   Gait steady and fast without use of assistive device  Cognitive Function:     6CIT Screen 08/09/2020 05/26/2019  04/22/2018 04/21/2017  What Year? 0 points 0 points 0 points 0 points  What month? 0 points 0 points 0 points 0 points  What time? 0 points 0 points 0 points 0 points  Count back from 20 0 points 0 points 2 points 0 points  Months in reverse 4 points 4 points 4 points 4 points  Repeat phrase 10 points 2 points 2 points 6 points  Total Score _0 Immunizations Immunization History  Administered Date(s) Administered  . Fluad Quad(high Dose 65+) 02/22/2019, 05/02/2019, 02/10/2020  . Meningococcal Conjugate 12/10/2006  . Pneumococcal Polysaccharide-23 06/29/2018, 05/02/2019  . Td 12/10/2006    TDAP status: Due, Education has been provided regarding the importance of this vaccine. Advised may receive this vaccine at local pharmacy or Health Dept. Aware to provide a copy of the vaccination record if obtained from local pharmacy or Health Dept. Verbalized acceptance and understanding.  Flu Vaccine status: Up to date  Pneumococcal vaccine status: Up to date  Covid-19 vaccine status: Completed vaccines . Pt advised to bring a copy of vaccine record to next appt.   Qualifies for Shingles Vaccine? Yes   Zostavax completed No   Shingrix Completed?: No.    Education has been provided regarding the importance of this vaccine. Patient has been advised to call insurance company to determine out of pocket expense if they have not yet received this vaccine. Advised may also receive vaccine  at local pharmacy or Health Dept. Verbalized acceptance and understanding.  Screening Tests Health Maintenance  Topic Date Due  . COVID-19 Vaccine (1) Never done  . OPHTHALMOLOGY EXAM  08/12/2018  . PNA vac Low Risk Adult (2 of 2 - PCV13) 05/01/2020  . FOOT EXAM  07/13/2020  . TETANUS/TDAP  04/14/2021 (Originally 12/09/2016)  . HEMOGLOBIN A1C  08/10/2020  . INFLUENZA VACCINE  11/12/2020  . MAMMOGRAM  12/15/2020  . URINE MICROALBUMIN  02/09/2021  . COLONOSCOPY (Pts 45-49yrs Insurance coverage will need  to be confirmed)  11/05/2021  . DEXA SCAN  Completed  . Hepatitis C Screening  Completed  . HPV VACCINES  Aged Out    Health Maintenance  Health Maintenance Due  Topic Date Due  . COVID-19 Vaccine (1) Never done  . OPHTHALMOLOGY EXAM  08/12/2018  . PNA vac Low Risk Adult (2 of 2 - PCV13) 05/01/2020  . FOOT EXAM  07/13/2020    Colorectal cancer screening: Type of screening: Colonoscopy. Completed 11/05/16. Repeat every 5 years  Mammogram status: Completed 12/16/19. Repeat every year  Bone Density status: Completed 11/17/18. Results reflect: Bone density results: NORMAL. Repeat every 2 years.  Lung Cancer Screening: (Low Dose CT Chest recommended if Age 55-80 years, 30 pack-year currently smoking OR have quit w/in 15years.) does not qualify.   Additional Screening:  Hepatitis C Screening: does qualify; Completed 12/19/15  Vision Screening: Recommended annual ophthalmology exams for early detection of glaucoma and other disorders of the eye. Is the patient up to date with their annual eye exam?  No  Who is the provider or what is the name of the office in which the patient attends annual eye exams? Dr. Bell.   Dental Screening: Recommended annual dental exams for proper oral hygiene  Community Resource Referral / Chronic Care Management: CRR required this visit?  No   CCM required this visit?  No      Plan:     I have personally reviewed and noted the following in the patient's chart:   . Medical and social history . Use of alcohol, tobacco or illicit drugs  . Current medications and supplements . Functional ability and status . Nutritional status . Physical activity . Advanced directives . List of other physicians . Hospitalizations, surgeries, and ER visits in previous 12 months . Vitals . Screenings to include cognitive, depression, and falls . Referrals and appointments  In addition, I have reviewed and discussed with patient certain preventive protocols, quality  metrics, and best practice recommendations. A written personalized care plan for preventive services as well as general preventive health recommendations were provided to patient.      , LPN   08/09/2020   Nurse Notes: none     

## 2020-08-09 NOTE — Patient Instructions (Signed)
Alexandra Foster , Thank you for taking time to come for your Medicare Wellness Visit. I appreciate your ongoing commitment to your health goals. Please review the following plan we discussed and let me know if I can assist you in the future.   Screening recommendations/referrals: Colonoscopy: done 11/05/16. Repeat in 2023.  Mammogram: done 12/16/19 Bone Density: done 11/17/18 Recommended yearly ophthalmology/optometry visit for glaucoma screening and checkup Recommended yearly dental visit for hygiene and checkup  Vaccinations: Influenza vaccine: done 02/10/20 Pneumococcal vaccine: done 05/02/19 Tdap vaccine: due Shingles vaccine: Shingrix discussed. Please contact your pharmacy for coverage information.  Covid-19: please bring a copy of your vaccine record to your next appointment.   Advanced directives: Advance directive discussed with you today. I have provided a copy for you to complete at home and have notarized. Once this is complete please bring a copy in to our office so we can scan it into your chart.  Conditions/risks identified: Recommend drinking 6-8 glasses of water per day   Next appointment: Follow up in one year for your annual wellness visit    Preventive Care 65 Years and Older, Female Preventive care refers to lifestyle choices and visits with your health care provider that can promote health and wellness. What does preventive care include?  A yearly physical exam. This is also called an annual well check.  Dental exams once or twice a year.  Routine eye exams. Ask your health care provider how often you should have your eyes checked.  Personal lifestyle choices, including:  Daily care of your teeth and gums.  Regular physical activity.  Eating a healthy diet.  Avoiding tobacco and drug use.  Limiting alcohol use.  Practicing safe sex.  Taking low-dose aspirin every day.  Taking vitamin and mineral supplements as recommended by your health care  provider. What happens during an annual well check? The services and screenings done by your health care provider during your annual well check will depend on your age, overall health, lifestyle risk factors, and family history of disease. Counseling  Your health care provider may ask you questions about your:  Alcohol use.  Tobacco use.  Drug use.  Emotional well-being.  Home and relationship well-being.  Sexual activity.  Eating habits.  History of falls.  Memory and ability to understand (cognition).  Work and work Statistician.  Reproductive health. Screening  You may have the following tests or measurements:  Height, weight, and BMI.  Blood pressure.  Lipid and cholesterol levels. These may be checked every 5 years, or more frequently if you are over 81 years old.  Skin check.  Lung cancer screening. You may have this screening every year starting at age 52 if you have a 30-pack-year history of smoking and currently smoke or have quit within the past 15 years.  Fecal occult blood test (FOBT) of the stool. You may have this test every year starting at age 41.  Flexible sigmoidoscopy or colonoscopy. You may have a sigmoidoscopy every 5 years or a colonoscopy every 10 years starting at age 31.  Hepatitis C blood test.  Hepatitis B blood test.  Sexually transmitted disease (STD) testing.  Diabetes screening. This is done by checking your blood sugar (glucose) after you have not eaten for a while (fasting). You may have this done every 1-3 years.  Bone density scan. This is done to screen for osteoporosis. You may have this done starting at age 58.  Mammogram. This may be done every 1-2 years. Talk to  your health care provider about how often you should have regular mammograms. Talk with your health care provider about your test results, treatment options, and if necessary, the need for more tests. Vaccines  Your health care provider may recommend certain  vaccines, such as:  Influenza vaccine. This is recommended every year.  Tetanus, diphtheria, and acellular pertussis (Tdap, Td) vaccine. You may need a Td booster every 10 years.  Zoster vaccine. You may need this after age 50.  Pneumococcal 13-valent conjugate (PCV13) vaccine. One dose is recommended after age 63.  Pneumococcal polysaccharide (PPSV23) vaccine. One dose is recommended after age 49. Talk to your health care provider about which screenings and vaccines you need and how often you need them. This information is not intended to replace advice given to you by your health care provider. Make sure you discuss any questions you have with your health care provider. Document Released: 04/27/2015 Document Revised: 12/19/2015 Document Reviewed: 01/30/2015 Elsevier Interactive Patient Education  2017 [employer] Prevention in the Home Falls can cause injuries. They can happen to people of all ages. There are many things you can do to make your home safe and to help prevent falls. What can I do on the outside of my home?  Regularly fix the edges of walkways and driveways and fix any cracks.  Remove anything that might make you trip as you walk through a door, such as a raised step or threshold.  Trim any bushes or trees on the path to your home.  Use bright outdoor lighting.  Clear any walking paths of anything that might make someone trip, such as rocks or tools.  Regularly check to see if handrails are loose or broken. Make sure that both sides of any steps have handrails.  Any raised decks and porches should have guardrails on the edges.  Have any leaves, snow, or ice cleared regularly.  Use sand or salt on walking paths during winter.  Clean up any spills in your garage right away. This includes oil or grease spills. What can I do in the bathroom?  Use night lights.  Install grab bars by the toilet and in the tub and shower. Do not use towel bars as grab  bars.  Use non-skid mats or decals in the tub or shower.  If you need to sit down in the shower, use a plastic, non-slip stool.  Keep the floor dry. Clean up any water that spills on the floor as soon as it happens.  Remove soap buildup in the tub or shower regularly.  Attach bath mats securely with double-sided non-slip rug tape.  Do not have throw rugs and other things on the floor that can make you trip. What can I do in the bedroom?  Use night lights.  Make sure that you have a light by your bed that is easy to reach.  Do not use any sheets or blankets that are too big for your bed. They should not hang down onto the floor.  Have a firm chair that has side arms. You can use this for support while you get dressed.  Do not have throw rugs and other things on the floor that can make you trip. What can I do in the kitchen?  Clean up any spills right away.  Avoid walking on wet floors.  Keep items that you use a lot in easy-to-reach places.  If you need to reach something above you, use a strong step stool that has  a grab bar.  Keep electrical cords out of the way.  Do not use floor polish or wax that makes floors slippery. If you must use wax, use non-skid floor wax.  Do not have throw rugs and other things on the floor that can make you trip. What can I do with my stairs?  Do not leave any items on the stairs.  Make sure that there are handrails on both sides of the stairs and use them. Fix handrails that are broken or loose. Make sure that handrails are as long as the stairways.  Check any carpeting to make sure that it is firmly attached to the stairs. Fix any carpet that is loose or worn.  Avoid having throw rugs at the top or bottom of the stairs. If you do have throw rugs, attach them to the floor with carpet tape.  Make sure that you have a light switch at the top of the stairs and the bottom of the stairs. If you do not have them, ask someone to add them for  you. What else can I do to help prevent falls?  Wear shoes that:  Do not have high heels.  Have rubber bottoms.  Are comfortable and fit you well.  Are closed at the toe. Do not wear sandals.  If you use a stepladder:  Make sure that it is fully opened. Do not climb a closed stepladder.  Make sure that both sides of the stepladder are locked into place.  Ask someone to hold it for you, if possible.  Clearly mark and make sure that you can see:  Any grab bars or handrails.  First and last steps.  Where the edge of each step is.  Use tools that help you move around (mobility aids) if they are needed. These include:  Canes.  Walkers.  Scooters.  Crutches.  Turn on the lights when you go into a dark area. Replace any light bulbs as soon as they burn out.  Set up your furniture so you have a clear path. Avoid moving your furniture around.  If any of your floors are uneven, fix them.  If there are any pets around you, be aware of where they are.  Review your medicines with your doctor. Some medicines can make you feel dizzy. This can increase your chance of falling. Ask your doctor what other things that you can do to help prevent falls. This information is not intended to replace advice given to you by your health care provider. Make sure you discuss any questions you have with your health care provider. Document Released: 01/25/2009 Document Revised: 09/06/2015 Document Reviewed: 05/05/2014 Elsevier Interactive Patient Education  2017 [employer].

## 2020-08-23 NOTE — Progress Notes (Signed)
Name: Alexandra Foster   MRN: [id number]    DOB: July 15, 1950   Date:08/24/2020       Progress Note  Subjective  Chief Complaint  Follow up   HPI    OAboth knees:no instability, pain is aching like and only when walking it can go up to 10/10, she states both knees are painful, no effusion or erythema. She denies falls.   DM: glucose at home has been 124-128  fasting, last A1C at goal, denies polyphagia, polydipsia or polyuria. She has dyslipidemia, obesity. She is due for an eye exam.   OSA: machine broke and she has not been wearing it lately, advised to contact me with the name of the company that providers her machine   HTN: she states she still has medication at home, denies chest pain, palpitation of sob  AR: she states having nasal congestion since yesterday, using Astelin and not sure if taking zyrtec of singulair. She asked for more prednisone, but explained not indicated but we should check her for COVID and resume allergy medications. Offered flonase but she declined . She was frustrated about not getting prednisone and having to get tested. Discussed self isolation and need to monitor for SOB and get therapy if COVID-19 positive   Leg cramps: improves with rest, states not sure if baclofen works, initially said she was out, after that she said she has it at home.   GERD: denies heart burn or indigestion as long as she takes PPI   Chronic bronchitis: she coughs in the mornings, dry cough, smoked for 30 years but quit in 2008, she denies sob or wheezing. Taking Trelegy prn only, explained only valid for 30 days once opened   Morbid obesity: she walks one mile daily, she still goes out to eat out but only about twice a week, she does not eat fast food, she drinks mountain three times a week - one can. She is not sure what to do to lose weight. "I love to eat"  Patient Active Problem List   Diagnosis Date Noted  . Personal history of COVID-19 07/14/2019  . Bilateral lower  extremity edema 05/03/2018  . Lower extremity pain, bilateral 05/03/2018  . Diabetes mellitus type 2 in obese ([place]) 04/23/2018  . Osteoarthritis of both knees 01/14/2018  . Urge incontinence 03/14/2016  . Osteopenia 02/13/2016  . OSA (obstructive sleep apnea) 01/08/2016  . Allergic rhinitis 04/13/2015  . COPD (chronic obstructive pulmonary disease) ([place]) 09/18/2014  . Morbid obesity ([place]) 09/18/2014  . Esophagitis, reflux 09/18/2014  . Essential hypertension 09/21/2006    Past Surgical History:  Procedure Laterality Date  . ABDOMINAL HYSTERECTOMY    . COLONOSCOPY  10/29/2006   Dr Allen Norris  . COLONOSCOPY WITH PROPOFOL N/A 11/05/2016   Procedure: COLONOSCOPY WITH PROPOFOL;  Surgeon: Robert Bellow, MD;  Location: [facility] ENDOSCOPY;  Service: Endoscopy;  Laterality: N/A;  . NASAL SINUS SURGERY  2002   Dr Carlis Abbott    Family History  Problem Relation Age of Onset  . Congestive Heart Failure Mother   . Coronary artery disease Father   . Breast cancer Sister 8  . Heart disease Brother   . Varicose Veins Brother   . Alcohol abuse Brother     Social History   Tobacco Use  . Smoking status: Former Smoker    Packs/day: 2.00    Years: 40.00    Pack years: 80.00    Types: Cigarettes    Quit date: 2008    Years since  quitting: 14.3  . Smokeless tobacco: Former Systems developer    Types: Snuff    Quit date: 04/2001  . Tobacco comment: smoking cessation materials not required  Substance Use Topics  . Alcohol use: No    Alcohol/week: 0.0 standard drinks     Current Outpatient Medications:  .  albuterol (PROVENTIL) (2.5 MG/3ML) 0.083% nebulizer solution, Take 3 mLs (2.5 mg total) by nebulization every 6 (six) hours as needed for wheezing or shortness of breath., Disp: 150 mL, Rfl: 1 .  aspirin 81 MG tablet, Take 1 tablet (81 mg total) by mouth daily., Disp: 30 tablet, Rfl: 0 .  azelastine (ASTELIN) 0.1 % nasal spray, Place 2 sprays into both nostrils 2 (two) times daily as needed for rhinitis  or allergies., Disp: 30 mL, Rfl: 1 .  baclofen (LIORESAL) 10 MG tablet, TAKE 1 TABLET DAILY AS NEEDED FOR MUSCLE SPASMS, Disp: 60 tablet, Rfl: 0 .  calcium carbonate (OSCAL) 1500 (600 Ca) MG TABS tablet, Take 600 mg of elemental calcium by mouth 2 (two) times daily with a meal., Disp: , Rfl:  .  Cetirizine HCl (ZYRTEC ALLERGY) 10 MG TBDP, Take 10 mg by mouth at bedtime. For sinus symptoms and seasonal allergies, Disp: 30 tablet, Rfl: 1 .  docusate sodium (COLACE) 100 MG capsule, Take 100 mg by mouth daily., Disp: , Rfl:  .  Fluticasone-Umeclidin-Vilant (TRELEGY ELLIPTA) 100-62.5-25 MCG/INH AEPB, Inhale 1 puff into the lungs daily. In place of Stiolto, Disp: 180 each, Rfl: 1 .  metFORMIN (GLUCOPHAGE-XR) 750 MG 24 hr tablet, Take 1 tablet (750 mg total) by mouth daily with breakfast., Disp: 90 tablet, Rfl: 1 .  Multiple Vitamins-Minerals (MULTIVITAL PO), Take 1 tablet by mouth daily., Disp: , Rfl:  .  triamterene-hydrochlorothiazide (DYAZIDE) 37.5-25 MG capsule, TAKE 1 CAPSULE EVERY DAY, Disp: 90 capsule, Rfl: 1 .  vitamin B-12 (CYANOCOBALAMIN) 100 MCG tablet, Take 100 mcg by mouth daily., Disp: , Rfl:  .  atorvastatin (LIPITOR) 40 MG tablet, Take 1 tablet (40 mg total) by mouth daily., Disp: 90 tablet, Rfl: 1 .  Blood Glucose Monitoring Suppl (ACCU-CHEK AVIVA PLUS) w/Device KIT, 1 each by Other route See admin instructions. (Patient not taking: Reported on 08/24/2020), Disp: 1 kit, Rfl: 0 .  glucose blood (ACCU-CHEK AVIVA PLUS) test strip, Use as instructed (Patient not taking: Reported on 08/24/2020), Disp: 100 each, Rfl: 12 .  Lancets (ACCU-CHEK SOFT TOUCH) lancets, Use as instructed (Patient not taking: Reported on 08/24/2020), Disp: 100 each, Rfl: 12 .  montelukast (SINGULAIR) 10 MG tablet, TAKE 1 TABLET AT BEDTIME (Patient not taking: Reported on 08/24/2020), Disp: 90 tablet, Rfl: 1 .  omeprazole (PRILOSEC) 20 MG capsule, Take 1 capsule (20 mg total) by mouth daily. (Patient not taking: No sig  reported), Disp: 90 capsule, Rfl: 1 .  Thiamine HCl (VITAMIN B-1) 250 MG tablet, Take 250 mg by mouth daily. (Patient not taking: Reported on 08/24/2020), Disp: , Rfl:   No Known Allergies  I personally reviewed active problem list, medication list, allergies, family history, social history with the patient/caregiver today.   ROS  Constitutional: Negative for fever or weight change.  Respiratory: positive  for cough but no  shortness of breath.   Cardiovascular: Negative for chest pain or palpitations.  Gastrointestinal: Negative for abdominal pain, no bowel changes.  Musculoskeletal: positive  for gait problem but no joint swelling.  Skin: Negative for rash.  Neurological: Negative for dizziness or headache.  No other specific complaints in a complete review of systems (  except as listed in HPI above).   Objective  Vitals:   08/24/20 1522  BP: 134/82  Pulse: 78  Resp: 16  Temp: 99.1 F (37.3 C)  TempSrc: Oral  SpO2: 96%  Weight: 263 lb (119.3 kg)  Height: '5\' 5"'  (1.651 m)    Body mass index is 43.77 kg/m.  Physical Exam  Constitutional: Patient appears well-developed and well-nourished. Obese  No distress.  HEENT: head atraumatic, normocephalic, pupils equal and reactive to light,  neck supple, throat within normal limits Cardiovascular: Normal rate, regular rhythm and normal heart sounds.  No murmur heard. No BLE edema. Pulmonary/Chest: Effort normal and breath sounds normal. No respiratory distress. Abdominal: Soft.  There is no tenderness. Psychiatric: Patient has a normal mood and affect. behavior is normal. Judgment and thought content normal.  Recent Results (from the past 2160 hour(s))  POCT HgB A1C     Status: Abnormal   Collection Time: 08/24/20  3:50 PM  Result Value Ref Range   Hemoglobin A1C 6.4 (A) 4.0 - 5.6 %   HbA1c POC (<> result, manual entry)     HbA1c, POC (prediabetic range)     HbA1c, POC (controlled diabetic range)         PHQ2/9: Depression screen [facility] 2/9 08/24/2020 08/09/2020 07/31/2020 03/06/2020 02/10/2020  Decreased Interest 0 0 0 2 0  Down, Depressed, Hopeless 0 0 0 0 0  PHQ - 2 Score 0 0 0 2 0  Altered sleeping - - - 0 -  Tired, decreased energy - - - 0 -  Change in appetite - - - 0 -  Feeling bad or failure about yourself  - - - 0 -  Trouble concentrating - - - 0 -  Moving slowly or fidgety/restless - - - 0 -  Suicidal thoughts - - - 0 -  PHQ-9 Score - - - 2 -  Difficult doing work/chores - - - - -  Some recent data might be hidden    phq 9 is negative   Fall Risk: Fall Risk  08/24/2020 08/09/2020 07/31/2020 03/06/2020 02/10/2020  Falls in the past year? 0 0 0 0 0  Number falls in past yr: 0 0 0 0 0  Injury with Fall? 0 0 0 0 0  Risk for fall due to : - No Fall Risks - - -  Follow up Falls evaluation completed Falls prevention discussed Falls evaluation completed - -    Functional Status Survey: Is the patient deaf or have difficulty hearing?: No Does the patient have difficulty seeing, even when wearing glasses/contacts?: No Does the patient have difficulty concentrating, remembering, or making decisions?: No Does the patient have difficulty walking or climbing stairs?: Yes Does the patient have difficulty dressing or bathing?: No Does the patient have difficulty doing errands alone such as visiting a doctor's office or shopping?: No    Assessment & Plan  1. Dyslipidemia associated with type 2 diabetes mellitus (HCC)  - HM Diabetes Foot Exam - POCT HgB A1C - Lipid panel - atorvastatin (LIPITOR) 40 MG tablet; Take 1 tablet (40 mg total) by mouth daily.  Dispense: 90 tablet; Refill: 1  2. Simple chronic bronchitis ([place])  Discussed importance of taking it daily   3. Perennial allergic rhinitis with seasonal variation  Resume medication  4. Primary osteoarthritis of both knees  Stable, able to walk daily   5. OSA (obstructive sleep apnea)  - CBC with  Differential/Platelet  6. Gastroesophageal reflux disease without esophagitis  Doing well  at this time  7. Morbid obesity ([place])  Discussed with the patient the risk posed by an increased BMI. Discussed importance of portion control, calorie counting and at least 150 minutes of physical activity weekly. Avoid sweet beverages and drink more water. Eat at least 6 servings of fruit and vegetables daily   8. Essential hypertension  - COMPLETE METABOLIC PANEL WITH GFR  9. Diabetes mellitus type 2 in obese ([place])   10. Nasal congestion  - Novel Coronavirus, NAA ([facility])  11. Carotid artery plaque, unspecified laterality  - atorvastatin (LIPITOR) 40 MG tablet; Take 1 tablet (40 mg total) by mouth daily.  Dispense: 90 tablet; Refill: 1  12. Muscle cramp

## 2020-08-24 ENCOUNTER — Other Ambulatory Visit: Payer: Self-pay

## 2020-08-24 ENCOUNTER — Ambulatory Visit (INDEPENDENT_AMBULATORY_CARE_PROVIDER_SITE_OTHER): Payer: Medicare HMO | Admitting: Family Medicine

## 2020-08-24 ENCOUNTER — Encounter: Payer: Self-pay | Admitting: Family Medicine

## 2020-08-24 VITALS — BP 134/82 | HR 78 | Temp 99.1°F | Resp 16 | Ht 65.0 in | Wt 263.0 lb

## 2020-08-24 DIAGNOSIS — K219 Gastro-esophageal reflux disease without esophagitis: Secondary | ICD-10-CM

## 2020-08-24 DIAGNOSIS — Z23 Encounter for immunization: Secondary | ICD-10-CM | POA: Diagnosis not present

## 2020-08-24 DIAGNOSIS — J3089 Other allergic rhinitis: Secondary | ICD-10-CM | POA: Diagnosis not present

## 2020-08-24 DIAGNOSIS — I6529 Occlusion and stenosis of unspecified carotid artery: Secondary | ICD-10-CM

## 2020-08-24 DIAGNOSIS — I10 Essential (primary) hypertension: Secondary | ICD-10-CM

## 2020-08-24 DIAGNOSIS — M170 Bilateral primary osteoarthritis of knee: Secondary | ICD-10-CM | POA: Diagnosis not present

## 2020-08-24 DIAGNOSIS — G4733 Obstructive sleep apnea (adult) (pediatric): Secondary | ICD-10-CM | POA: Diagnosis not present

## 2020-08-24 DIAGNOSIS — J302 Other seasonal allergic rhinitis: Secondary | ICD-10-CM

## 2020-08-24 DIAGNOSIS — E1169 Type 2 diabetes mellitus with other specified complication: Secondary | ICD-10-CM

## 2020-08-24 DIAGNOSIS — J410 Simple chronic bronchitis: Secondary | ICD-10-CM

## 2020-08-24 DIAGNOSIS — E785 Hyperlipidemia, unspecified: Secondary | ICD-10-CM | POA: Diagnosis not present

## 2020-08-24 DIAGNOSIS — R0981 Nasal congestion: Secondary | ICD-10-CM | POA: Diagnosis not present

## 2020-08-24 DIAGNOSIS — R252 Cramp and spasm: Secondary | ICD-10-CM

## 2020-08-24 DIAGNOSIS — E669 Obesity, unspecified: Secondary | ICD-10-CM

## 2020-08-24 LAB — POCT GLYCOSYLATED HEMOGLOBIN (HGB A1C): Hemoglobin A1C: 6.4 % — AB (ref 4.0–5.6)

## 2020-08-24 MED ORDER — ATORVASTATIN CALCIUM 40 MG PO TABS: 1.0000 | ORAL_TABLET | Freq: Every day | ORAL | 1 refills | Status: DC

## 2020-08-25 LAB — SPECIMEN STATUS REPORT

## 2020-08-25 LAB — NOVEL CORONAVIRUS, NAA: SARS-CoV-2, NAA: NOT DETECTED

## 2020-08-25 LAB — SARS-COV-2, NAA 2 DAY TAT

## 2020-08-31 ENCOUNTER — Other Ambulatory Visit: Payer: Self-pay | Admitting: Family Medicine

## 2020-08-31 ENCOUNTER — Telehealth: Payer: Self-pay | Admitting: Family Medicine

## 2020-08-31 DIAGNOSIS — J329 Chronic sinusitis, unspecified: Secondary | ICD-10-CM

## 2020-08-31 DIAGNOSIS — J441 Chronic obstructive pulmonary disease with (acute) exacerbation: Secondary | ICD-10-CM

## 2020-08-31 DIAGNOSIS — J302 Other seasonal allergic rhinitis: Secondary | ICD-10-CM

## 2020-08-31 DIAGNOSIS — J3089 Other allergic rhinitis: Secondary | ICD-10-CM

## 2020-08-31 DIAGNOSIS — I10 Essential (primary) hypertension: Secondary | ICD-10-CM

## 2020-08-31 DIAGNOSIS — J310 Chronic rhinitis: Secondary | ICD-10-CM

## 2020-08-31 NOTE — Telephone Encounter (Signed)
Pt requesting antibotic and prednisone please sent to [facility]

## 2020-09-03 ENCOUNTER — Other Ambulatory Visit: Payer: Self-pay | Admitting: Family Medicine

## 2020-09-03 MED ORDER — AMOXICILLIN-POT CLAVULANATE 875-125 MG PO TABS: 1.0000 | ORAL_TABLET | Freq: Two times a day (BID) | ORAL | 0 refills | Status: DC

## 2020-09-05 ENCOUNTER — Telehealth: Payer: Self-pay

## 2020-09-05 NOTE — Telephone Encounter (Signed)
RX was written and faxed to [facility] 09/04/2020

## 2020-09-05 NOTE — Telephone Encounter (Signed)
Copied from [place] [id number]. Topic: General - Inquiry >> Sep 05, 2020  4:05 PM Valere Dross wrote: Reason for CRM: Patient called in stated she called yesterday 09/04/2020, no documentation is shown on chart, patient states that her CPAP machine is no longer working and requesting to have PCP write a prescription for a new one. Please advise

## 2020-09-18 ENCOUNTER — Ambulatory Visit: Payer: Medicare HMO | Admitting: Podiatry

## 2020-09-18 ENCOUNTER — Other Ambulatory Visit: Payer: Self-pay

## 2020-09-18 DIAGNOSIS — G4733 Obstructive sleep apnea (adult) (pediatric): Secondary | ICD-10-CM

## 2020-10-02 DIAGNOSIS — G4733 Obstructive sleep apnea (adult) (pediatric): Secondary | ICD-10-CM | POA: Diagnosis not present

## 2020-10-04 ENCOUNTER — Other Ambulatory Visit: Payer: Self-pay | Admitting: Family Medicine

## 2020-10-04 DIAGNOSIS — E1169 Type 2 diabetes mellitus with other specified complication: Secondary | ICD-10-CM

## 2020-10-04 DIAGNOSIS — E113393 Type 2 diabetes mellitus with moderate nonproliferative diabetic retinopathy without macular edema, bilateral: Secondary | ICD-10-CM | POA: Diagnosis not present

## 2020-10-04 DIAGNOSIS — K219 Gastro-esophageal reflux disease without esophagitis: Secondary | ICD-10-CM

## 2020-10-08 DIAGNOSIS — M25562 Pain in left knee: Secondary | ICD-10-CM | POA: Diagnosis not present

## 2020-10-08 DIAGNOSIS — M170 Bilateral primary osteoarthritis of knee: Secondary | ICD-10-CM | POA: Diagnosis not present

## 2020-10-08 DIAGNOSIS — M25561 Pain in right knee: Secondary | ICD-10-CM | POA: Diagnosis not present

## 2020-10-22 DIAGNOSIS — M170 Bilateral primary osteoarthritis of knee: Secondary | ICD-10-CM | POA: Diagnosis not present

## 2020-10-22 DIAGNOSIS — M25561 Pain in right knee: Secondary | ICD-10-CM | POA: Diagnosis not present

## 2020-10-22 DIAGNOSIS — M25562 Pain in left knee: Secondary | ICD-10-CM | POA: Diagnosis not present

## 2020-10-29 DIAGNOSIS — M170 Bilateral primary osteoarthritis of knee: Secondary | ICD-10-CM | POA: Diagnosis not present

## 2020-10-29 DIAGNOSIS — M25561 Pain in right knee: Secondary | ICD-10-CM | POA: Diagnosis not present

## 2020-10-29 DIAGNOSIS — M25562 Pain in left knee: Secondary | ICD-10-CM | POA: Diagnosis not present

## 2020-11-01 DIAGNOSIS — G4733 Obstructive sleep apnea (adult) (pediatric): Secondary | ICD-10-CM | POA: Diagnosis not present

## 2020-11-05 DIAGNOSIS — M25561 Pain in right knee: Secondary | ICD-10-CM | POA: Diagnosis not present

## 2020-11-05 DIAGNOSIS — M25562 Pain in left knee: Secondary | ICD-10-CM | POA: Diagnosis not present

## 2020-11-05 DIAGNOSIS — M170 Bilateral primary osteoarthritis of knee: Secondary | ICD-10-CM | POA: Diagnosis not present

## 2020-11-19 DIAGNOSIS — M25561 Pain in right knee: Secondary | ICD-10-CM | POA: Diagnosis not present

## 2020-11-19 DIAGNOSIS — M25562 Pain in left knee: Secondary | ICD-10-CM | POA: Diagnosis not present

## 2020-11-19 DIAGNOSIS — M170 Bilateral primary osteoarthritis of knee: Secondary | ICD-10-CM | POA: Diagnosis not present

## 2020-12-02 DIAGNOSIS — G4733 Obstructive sleep apnea (adult) (pediatric): Secondary | ICD-10-CM | POA: Diagnosis not present

## 2020-12-06 ENCOUNTER — Other Ambulatory Visit: Payer: Self-pay | Admitting: Family Medicine

## 2020-12-06 DIAGNOSIS — R252 Cramp and spasm: Secondary | ICD-10-CM

## 2020-12-06 NOTE — Telephone Encounter (Signed)
Requested medication (s) are due for refill today:  no  Requested medication (s) are on the active medication list: yes  Last refill: 08/02/2020  Future visit scheduled:  yes   Notes to clinic:  this refill cannot be delegated   Requested Prescriptions  Pending Prescriptions Disp Refills   baclofen (LIORESAL) 10 MG tablet [Pharmacy Med Name: BACLOFEN 10 MG Tablet] 60 tablet 0    Sig: TAKE 1 TABLET DAILY AS NEEDED FOR MUSCLE SPASMS     Not Delegated - Analgesics:  Muscle Relaxants Failed - 12/06/2020  2:46 PM      Failed - This refill cannot be delegated      Passed - Valid encounter within last 6 months    Recent Outpatient Visits           3 months ago Dyslipidemia associated with type 2 diabetes mellitus [facility])   [facility] Steele Sizer, MD   4 months ago Rhinosinusitis   [facility] [place], Kristeen Miss, PA-C   9 months ago Dyslipidemia associated with type 2 diabetes mellitus [facility])   [facility] [place], Drue Stager, MD   10 months ago Diabetes mellitus type 2 in obese [facility])   [facility] Steele Sizer, MD   1 year ago Chronic obstructive pulmonary disease with acute exacerbation [facility])   [facility] Delsa Grana, PA-C       Future Appointments             In 2 months Steele Sizer, MD [facility], [facility]

## 2020-12-07 ENCOUNTER — Other Ambulatory Visit: Payer: Self-pay

## 2020-12-20 ENCOUNTER — Other Ambulatory Visit: Payer: Self-pay | Admitting: Family Medicine

## 2020-12-20 DIAGNOSIS — Z1231 Encounter for screening mammogram for malignant neoplasm of breast: Secondary | ICD-10-CM

## 2021-01-01 ENCOUNTER — Ambulatory Visit
Admission: RE | Admit: 2021-01-01 | Discharge: 2021-01-01 | Disposition: A | Discharge status: Home / Self Care | Payer: Medicare HMO | Source: Ambulatory Visit | Attending: Family Medicine | Admitting: Family Medicine

## 2021-01-01 ENCOUNTER — Other Ambulatory Visit: Payer: Self-pay

## 2021-01-01 DIAGNOSIS — Z1231 Encounter for screening mammogram for malignant neoplasm of breast: Secondary | ICD-10-CM | POA: Diagnosis not present

## 2021-01-02 DIAGNOSIS — G4733 Obstructive sleep apnea (adult) (pediatric): Secondary | ICD-10-CM | POA: Diagnosis not present

## 2021-02-01 DIAGNOSIS — G4733 Obstructive sleep apnea (adult) (pediatric): Secondary | ICD-10-CM | POA: Diagnosis not present

## 2021-02-06 DIAGNOSIS — G4733 Obstructive sleep apnea (adult) (pediatric): Secondary | ICD-10-CM | POA: Diagnosis not present

## 2021-02-11 ENCOUNTER — Encounter: Payer: Self-pay | Admitting: Family Medicine

## 2021-02-11 ENCOUNTER — Other Ambulatory Visit: Payer: Self-pay

## 2021-02-11 ENCOUNTER — Ambulatory Visit (INDEPENDENT_AMBULATORY_CARE_PROVIDER_SITE_OTHER): Payer: Medicare HMO | Admitting: Family Medicine

## 2021-02-11 VITALS — BP 130/82 | HR 86 | Temp 98.2°F | Resp 16 | Ht 65.0 in | Wt 269.9 lb

## 2021-02-11 DIAGNOSIS — I10 Essential (primary) hypertension: Secondary | ICD-10-CM

## 2021-02-11 DIAGNOSIS — E1169 Type 2 diabetes mellitus with other specified complication: Secondary | ICD-10-CM

## 2021-02-11 DIAGNOSIS — Z23 Encounter for immunization: Secondary | ICD-10-CM

## 2021-02-11 DIAGNOSIS — E669 Obesity, unspecified: Secondary | ICD-10-CM | POA: Diagnosis not present

## 2021-02-11 DIAGNOSIS — E785 Hyperlipidemia, unspecified: Secondary | ICD-10-CM | POA: Diagnosis not present

## 2021-02-11 DIAGNOSIS — I6529 Occlusion and stenosis of unspecified carotid artery: Secondary | ICD-10-CM

## 2021-02-11 DIAGNOSIS — J3089 Other allergic rhinitis: Secondary | ICD-10-CM

## 2021-02-11 DIAGNOSIS — M170 Bilateral primary osteoarthritis of knee: Secondary | ICD-10-CM

## 2021-02-11 DIAGNOSIS — J410 Simple chronic bronchitis: Secondary | ICD-10-CM | POA: Diagnosis not present

## 2021-02-11 DIAGNOSIS — K219 Gastro-esophageal reflux disease without esophagitis: Secondary | ICD-10-CM | POA: Diagnosis not present

## 2021-02-11 DIAGNOSIS — J302 Other seasonal allergic rhinitis: Secondary | ICD-10-CM

## 2021-02-11 DIAGNOSIS — G4733 Obstructive sleep apnea (adult) (pediatric): Secondary | ICD-10-CM | POA: Diagnosis not present

## 2021-02-11 MED ORDER — TRIAMTERENE-HCTZ 37.5-25 MG PO CAPS: 1.0000 | ORAL_CAPSULE | Freq: Every day | ORAL | 1 refills | Status: DC

## 2021-02-11 MED ORDER — LORATADINE 10 MG PO TABS: 10.0000 mg | ORAL_TABLET | Freq: Every day | ORAL | 1 refills | Status: DC

## 2021-02-11 MED ORDER — METFORMIN HCL ER 750 MG PO TB24: 750.0000 mg | ORAL_TABLET | Freq: Every day | ORAL | 1 refills | Status: DC

## 2021-02-11 MED ORDER — MONTELUKAST SODIUM 10 MG PO TABS: 10.0000 mg | ORAL_TABLET | Freq: Every day | ORAL | 1 refills | Status: DC

## 2021-02-11 MED ORDER — ATORVASTATIN CALCIUM 40 MG PO TABS: 40.0000 mg | ORAL_TABLET | Freq: Every day | ORAL | 1 refills | Status: DC

## 2021-02-11 MED ORDER — OMEPRAZOLE 20 MG PO CPDR: 20.0000 mg | DELAYED_RELEASE_CAPSULE | Freq: Every day | ORAL | 1 refills | Status: DC

## 2021-02-11 NOTE — Progress Notes (Signed)
Name: Alexandra Foster   MRN: [id number]    DOB: [birth year]   Date:02/11/2021       Progress Note  Subjective  Chief Complaint  Chief Complaint  Patient presents with   Consult    Discuss cpap machine    HPI  OA both knees:no instability, pain is aching like and only when walking it can go up to 9/10, zero when resting,  she states both knees are painful, no effusion or erythema. She denies falls. She was seen by Ortho and had hyaluronic acid injections without much help   DM: glucose at home has been 120's  fasting, last A1C at goal but due for repeat level , denies polyphagia, polydipsia or polyuria. She has dyslipidemia, obesity. She is taking statins without problems, she is going to have eye exam tomorrow with Dr. Gloriann Loan   OSA: machine broke last week, but they replaced it yesterday and she got to wear last night.   HTN: she states she still has medication at home, denies chest pain, palpitation and has stable SOB with activity   AR: she states nasal congestion has increased in the past week when her CPAP machine broke. Initially nasal congestion but this morning also some clear rhinorrhea, feels stuffy, no fever or chills or change in appetite  Advised to resume Loratadine in am, make sure she is taking singular and resume using nasal sprays twice daily   Leg cramps: she states calves cramps at night, but does not last, discussed magnesium oxide, advised to stretch before going to bed and make sure she is staying hydrated   GERD: denies heart burn or indigestion as long as she takes PPI , unchanged   Chronic bronchitis: she coughs in the mornings, dry cough, smoked for 30 years but quit in 2008, she denies wheezing, but has some SOB with activity . Taking Trelegy prn only, explained only valid for 60 days once opened , she states she is using about every other day   Morbid obesity: she has only been walking inside her house due to knee pain, has big family dinners multiple times  a week. She states she has a large family and eats frequently . She cooks [place] style food daily for her husband and she likes to eat    Patient Active Problem List   Diagnosis Date Noted   Personal history of COVID-19 07/14/2019   Bilateral lower extremity edema 05/03/2018   Lower extremity pain, bilateral 05/03/2018   Diabetes mellitus type 2 in obese ([place]) 04/23/2018   Osteoarthritis of both knees 01/14/2018   Urge incontinence 03/14/2016   Osteopenia 02/13/2016   OSA (obstructive sleep apnea) 01/08/2016   Allergic rhinitis 04/13/2015   COPD (chronic obstructive pulmonary disease) ([place]) 09/18/2014   Morbid obesity ([place]) 09/18/2014   Esophagitis, reflux 09/18/2014   Essential hypertension 09/21/2006    Past Surgical History:  Procedure Laterality Date   ABDOMINAL HYSTERECTOMY     COLONOSCOPY  10/29/2006   Dr Allen Norris   COLONOSCOPY WITH PROPOFOL N/A 11/05/2016   Procedure: COLONOSCOPY WITH PROPOFOL;  Surgeon: Robert Bellow, MD;  Location: [facility] ENDOSCOPY;  Service: Endoscopy;  Laterality: N/A;   NASAL SINUS SURGERY  2002   Dr Carlis Abbott    Family History  Problem Relation Age of Onset   Congestive Heart Failure Mother    Coronary artery disease Father    Breast cancer Sister 21   Heart disease Brother    Varicose Veins Brother    Alcohol abuse Brother  Social History   Tobacco Use   Smoking status: Former    Packs/day: 2.00    Years: 40.00    Pack years: 80.00    Types: Cigarettes    Quit date: 2008    Years since quitting: 14.8   Smokeless tobacco: Former    Types: Snuff    Quit date: 04/2001   Tobacco comments:    smoking cessation materials not required  Substance Use Topics   Alcohol use: No    Alcohol/week: 0.0 standard drinks     Current Outpatient Medications:    albuterol (PROVENTIL) (2.5 MG/3ML) 0.083% nebulizer solution, Take 3 mLs (2.5 mg total) by nebulization every 6 (six) hours as needed for wheezing or shortness of breath., Disp: 150 mL,  Rfl: 1   aspirin 81 MG tablet, Take 1 tablet (81 mg total) by mouth daily., Disp: 30 tablet, Rfl: 0   atorvastatin (LIPITOR) 40 MG tablet, Take 1 tablet (40 mg total) by mouth daily., Disp: 90 tablet, Rfl: 1   azelastine (ASTELIN) 0.1 % nasal spray, Place 2 sprays into both nostrils 2 (two) times daily as needed for rhinitis or allergies., Disp: 30 mL, Rfl: 1   baclofen (LIORESAL) 10 MG tablet, TAKE 1 TABLET DAILY AS NEEDED FOR MUSCLE SPASMS, Disp: 60 tablet, Rfl: 0   calcium carbonate (OSCAL) 1500 (600 Ca) MG TABS tablet, Take 600 mg of elemental calcium by mouth 2 (two) times daily with a meal., Disp: , Rfl:    Cetirizine HCl (ZYRTEC ALLERGY) 10 MG TBDP, Take 10 mg by mouth at bedtime. For sinus symptoms and seasonal allergies, Disp: 30 tablet, Rfl: 1   docusate sodium (COLACE) 100 MG capsule, Take 100 mg by mouth daily., Disp: , Rfl:    fluticasone (FLONASE) 50 MCG/ACT nasal spray, , Disp: , Rfl:    Fluticasone-Umeclidin-Vilant (TRELEGY ELLIPTA) 100-62.5-25 MCG/INH AEPB, Inhale 1 puff into the lungs daily. In place of Stiolto, Disp: 180 each, Rfl: 1   loratadine (CLARITIN) 10 MG tablet, TAKE 1 TABLET EVERY DAY, Disp: 90 tablet, Rfl: 1   meloxicam (MOBIC) 15 MG tablet, , Disp: , Rfl:    metFORMIN (GLUCOPHAGE-XR) 750 MG 24 hr tablet, TAKE 1 TABLET DAILY WITH BREAKFAST, Disp: 90 tablet, Rfl: 1   Multiple Vitamins-Minerals (MULTIVITAL PO), Take 1 tablet by mouth daily., Disp: , Rfl:    omeprazole (PRILOSEC) 20 MG capsule, TAKE 1 CAPSULE EVERY DAY, Disp: 90 capsule, Rfl: 1   ORTHOVISC 30 MG/2ML SOSY, , Disp: , Rfl:    triamterene-hydrochlorothiazide (DYAZIDE) 37.5-25 MG capsule, TAKE 1 CAPSULE EVERY DAY, Disp: 90 capsule, Rfl: 1   vitamin B-12 (CYANOCOBALAMIN) 100 MCG tablet, Take 100 mcg by mouth daily., Disp: , Rfl:    amoxicillin-clavulanate (AUGMENTIN) 875-125 MG tablet, Take 1 tablet by mouth 2 (two) times daily. (Patient not taking: Reported on 02/11/2021), Disp: 20 tablet, Rfl: 0   Blood  Glucose Monitoring Suppl (ACCU-CHEK AVIVA PLUS) w/Device KIT, 1 each by Other route See admin instructions. (Patient not taking: No sig reported), Disp: 1 kit, Rfl: 0   glucose blood (ACCU-CHEK AVIVA PLUS) test strip, Use as instructed (Patient not taking: No sig reported), Disp: 100 each, Rfl: 12   Lancets (ACCU-CHEK SOFT TOUCH) lancets, Use as instructed (Patient not taking: No sig reported), Disp: 100 each, Rfl: 12   montelukast (SINGULAIR) 10 MG tablet, TAKE 1 TABLET AT BEDTIME (Patient not taking: No sig reported), Disp: 90 tablet, Rfl: 1   Thiamine HCl (VITAMIN B-1) 250 MG tablet, Take 250 mg  by mouth daily. (Patient not taking: No sig reported), Disp: , Rfl:   No Known Allergies  I personally reviewed active problem list, medication list, allergies, family history, social history, health maintenance with the patient/caregiver today.   ROS  Constitutional: Negative for fever, positive  weight change.  Respiratory: Negative for cough but has shortness of breath with activity .   Cardiovascular: Negative for chest pain or palpitations.  Gastrointestinal: Negative for abdominal pain, no bowel changes.  Musculoskeletal: positive for gait problem and intermittent bilateral  joint swelling.  Skin: Negative for rash.  Neurological: Negative for dizziness or headache.  No other specific complaints in a complete review of systems (except as listed in HPI above).   Objective  Vitals:   02/11/21 0851  BP: 130/82  Pulse: 86  Resp: 16  Temp: 98.2 F (36.8 C)  TempSrc: Oral  SpO2: 98%  Weight: 269 lb 14.4 oz (122.4 kg)  Height: _0  (1.651 m)    Body mass index is 44.91 kg/m.  Physical Exam  Constitutional: Patient appears well-developed and well-nourished. Obese  No distress.  HEENT: head atraumatic, normocephalic, pupils equal and reactive to light, neck supple Cardiovascular: Normal rate, regular rhythm and normal heart sounds.  No murmur heard. Trace  BLE  edema. Pulmonary/Chest: Effort normal and breath sounds normal. No respiratory distress. Abdominal: Soft.  There is no tenderness. Psychiatric: Patient has a normal mood and affect. behavior is normal. Judgment and thought content normal.   PHQ2/9: Depression screen [facility] 2/9 02/11/2021 08/24/2020 08/09/2020 07/31/2020 03/06/2020  Decreased Interest 0 0 0 0 2  Down, Depressed, Hopeless 0 0 0 0 0  PHQ - 2 Score 0 0 0 0 2  Altered sleeping - - - - 0  Tired, decreased energy - - - - 0  Change in appetite - - - - 0  Feeling bad or failure about yourself  - - - - 0  Trouble concentrating - - - - 0  Moving slowly or fidgety/restless - - - - 0  Suicidal thoughts - - - - 0  PHQ-9 Score - - - - 2  Difficult doing work/chores - - - - -  Some recent data might be hidden    phq 9 is negative   Fall Risk: Fall Risk  02/11/2021 08/24/2020 08/09/2020 07/31/2020 03/06/2020  Falls in the past year? 0 0 0 0 0  Number falls in past yr: 0 0 0 0 0  Injury with Fall? 0 0 0 0 0  Risk for fall due to : - - No Fall Risks - -  Follow up Falls evaluation completed Falls evaluation completed Falls prevention discussed Falls evaluation completed -      Functional Status Survey: Is the patient deaf or have difficulty hearing?: No Does the patient have difficulty seeing, even when wearing glasses/contacts?: No Does the patient have difficulty concentrating, remembering, or making decisions?: No Does the patient have difficulty walking or climbing stairs?: Yes Does the patient have difficulty dressing or bathing?: No Does the patient have difficulty doing errands alone such as visiting a doctor's office or shopping?: No    Assessment & Plan  1. Diabetes mellitus type 2 in obese (HCC)  - Urine Microalbumin w/creat. ratio - Hemoglobin A1c - metFORMIN (GLUCOPHAGE-XR) 750 MG 24 hr tablet; Take 1 tablet (750 mg total) by mouth daily with breakfast.  Dispense: 90 tablet; Refill: 1  2. Dyslipidemia associated  with type 2 diabetes mellitus (HCC)  - atorvastatin (LIPITOR) 40  MG tablet; Take 1 tablet (40 mg total) by mouth daily.  Dispense: 90 tablet; Refill: 1 - metFORMIN (GLUCOPHAGE-XR) 750 MG 24 hr tablet; Take 1 tablet (750 mg total) by mouth daily with breakfast.  Dispense: 90 tablet; Refill: 1  3. Need for immunization against influenza  - Flu Vaccine QUAD High Dose(Fluad)  4. Morbid obesity ([place])  Discussed with the patient the risk posed by an increased BMI. Discussed importance of portion control, calorie counting and at least 150 minutes of physical activity weekly. Avoid sweet beverages and drink more water. Eat at least 6 servings of fruit and vegetables daily    5. Simple chronic bronchitis (HCC)   6. Carotid artery plaque, unspecified laterality  - atorvastatin (LIPITOR) 40 MG tablet; Take 1 tablet (40 mg total) by mouth daily.  Dispense: 90 tablet; Refill: 1  7. Essential hypertension  - triamterene-hydrochlorothiazide (DYAZIDE) 37.5-25 MG capsule; Take 1 each (1 capsule total) by mouth daily.  Dispense: 90 capsule; Refill: 1  8. Gastroesophageal reflux disease without esophagitis  - omeprazole (PRILOSEC) 20 MG capsule; Take 1 capsule (20 mg total) by mouth daily.  Dispense: 90 capsule; Refill: 1  9. Perennial allergic rhinitis with seasonal variation  - montelukast (SINGULAIR) 10 MG tablet; Take 1 tablet (10 mg total) by mouth at bedtime.  Dispense: 90 tablet; Refill: 1 - loratadine (CLARITIN) 10 MG tablet; Take 1 tablet (10 mg total) by mouth daily.  Dispense: 90 tablet; Refill: 1  10. OSA (obstructive sleep apnea)   11. Primary osteoarthritis of both knees   12. Dyslipidemia

## 2021-02-11 NOTE — Patient Instructions (Signed)
Take Loratadine in am Zyrtec in pm Make sure you are taking montelukast and using fluticasone in am and Astelin in pm

## 2021-02-12 DIAGNOSIS — E113393 Type 2 diabetes mellitus with moderate nonproliferative diabetic retinopathy without macular edema, bilateral: Secondary | ICD-10-CM | POA: Diagnosis not present

## 2021-02-12 LAB — COMPLETE METABOLIC PANEL WITH GFR
AG Ratio: 1.2 (calc) (ref 1.0–2.5)
ALT: 19 U/L (ref 6–29)
AST: 22 U/L (ref 10–35)
Albumin: 3.8 g/dL (ref 3.6–5.1)
Alkaline phosphatase (APISO): 102 U/L (ref 37–153)
BUN: 13 mg/dL (ref 7–25)
CO2: 34 mmol/L — ABNORMAL HIGH (ref 20–32)
Calcium: 9.9 mg/dL (ref 8.6–10.4)
Chloride: 105 mmol/L (ref 98–110)
Creat: 0.98 mg/dL (ref 0.60–1.00)
Globulin: 3.3 g/dL (calc) (ref 1.9–3.7)
Glucose, Bld: 121 mg/dL — ABNORMAL HIGH (ref 65–99)
Potassium: 4.6 mmol/L (ref 3.5–5.3)
Sodium: 147 mmol/L — ABNORMAL HIGH (ref 135–146)
Total Bilirubin: 0.3 mg/dL (ref 0.2–1.2)
Total Protein: 7.1 g/dL (ref 6.1–8.1)
eGFR: 61 mL/min/{1.73_m2} (ref >=60)

## 2021-02-12 LAB — CBC WITH DIFFERENTIAL/PLATELET
Absolute Monocytes: 448 cells/uL (ref 200–950)
Basophils Absolute: 40 cells/uL (ref 0–200)
Basophils Relative: 0.5 %
Eosinophils Absolute: 1152 cells/uL — ABNORMAL HIGH (ref 15–500)
Eosinophils Relative: 14.4 %
HCT: 38.6 % (ref 35.0–45.0)
Hemoglobin: 11.8 g/dL (ref 11.7–15.5)
Lymphs Abs: 1552 cells/uL (ref 850–3900)
MCH: 23.9 pg — ABNORMAL LOW (ref 27.0–33.0)
MCHC: 30.6 g/dL — ABNORMAL LOW (ref 32.0–36.0)
MCV: 78.3 fL — ABNORMAL LOW (ref 80.0–100.0)
MPV: 10.7 fL (ref 7.5–12.5)
Monocytes Relative: 5.6 %
Neutro Abs: 4808 cells/uL (ref 1500–7800)
Neutrophils Relative %: 60.1 %
Platelets: 338 10*3/uL (ref 140–400)
RBC: 4.93 10*6/uL (ref 3.80–5.10)
RDW: 13.8 % (ref 11.0–15.0)
Total Lymphocyte: 19.4 %
WBC: 8 10*3/uL (ref 3.8–10.8)

## 2021-02-12 LAB — HEMOGLOBIN A1C
Hgb A1c MFr Bld: 6.3 % of total Hgb — ABNORMAL HIGH (ref <5.7)
Mean Plasma Glucose: 134 mg/dL
eAG (mmol/L): 7.4 mmol/L

## 2021-02-12 LAB — LIPID PANEL
Cholesterol: 106 mg/dL (ref <200)
HDL: 30 mg/dL — ABNORMAL LOW (ref >=50)
LDL Cholesterol (Calc): 62 mg/dL (calc)
Non-HDL Cholesterol (Calc): 76 mg/dL (calc) (ref <130)
Total CHOL/HDL Ratio: 3.5 (calc) (ref <5.0)
Triglycerides: 67 mg/dL (ref <150)

## 2021-02-12 LAB — MICROALBUMIN / CREATININE URINE RATIO
Creatinine, Urine: 151 mg/dL (ref 20–275)
Microalb Creat Ratio: 3 mcg/mg creat (ref <30)
Microalb, Ur: 0.5 mg/dL

## 2021-02-18 ENCOUNTER — Other Ambulatory Visit: Payer: Self-pay | Admitting: Family Medicine

## 2021-02-18 DIAGNOSIS — E1169 Type 2 diabetes mellitus with other specified complication: Secondary | ICD-10-CM

## 2021-02-18 DIAGNOSIS — J3089 Other allergic rhinitis: Secondary | ICD-10-CM

## 2021-02-18 DIAGNOSIS — I6529 Occlusion and stenosis of unspecified carotid artery: Secondary | ICD-10-CM

## 2021-02-18 DIAGNOSIS — J302 Other seasonal allergic rhinitis: Secondary | ICD-10-CM

## 2021-02-25 DIAGNOSIS — M170 Bilateral primary osteoarthritis of knee: Secondary | ICD-10-CM | POA: Diagnosis not present

## 2021-03-01 ENCOUNTER — Ambulatory Visit: Payer: Medicare HMO | Admitting: Family Medicine

## 2021-03-21 ENCOUNTER — Telehealth: Payer: Self-pay | Admitting: Emergency Medicine

## 2021-03-21 NOTE — Telephone Encounter (Signed)
Cough and congested. Would like something called in.

## 2021-03-25 NOTE — Telephone Encounter (Signed)
Spoke with patient and she already have something for the cough/congestion. She will call back if need

## 2021-03-25 NOTE — Telephone Encounter (Signed)
Call and schedule her appointment

## 2021-04-30 ENCOUNTER — Encounter: Payer: Self-pay | Admitting: Family Medicine

## 2021-04-30 ENCOUNTER — Telehealth (INDEPENDENT_AMBULATORY_CARE_PROVIDER_SITE_OTHER): Payer: Medicare HMO | Admitting: Family Medicine

## 2021-04-30 VITALS — HR 82 | Resp 18 | Ht 65.0 in | Wt 269.0 lb

## 2021-04-30 DIAGNOSIS — J069 Acute upper respiratory infection, unspecified: Secondary | ICD-10-CM

## 2021-04-30 LAB — POCT INFLUENZA A/B
Influenza A, POC: NEGATIVE
Influenza B, POC: NEGATIVE

## 2021-04-30 NOTE — Progress Notes (Signed)
Name: Alexandra Foster   MRN: [id number]    DOB: 05/27/1950   Date:04/30/2021       Progress Note  Subjective  Chief Complaint  Chief Complaint  Patient presents with   Sore Throat    I connected with  Judith Blonder Maenza on 04/30/21 at  9:40 AM EST by telephone and verified that I am speaking with the correct person using two identifiers.   I discussed the limitations, risks, security and privacy concerns of performing an evaluation and management service by telephone and the availability of in person appointments. Staff also discussed with the patient that there may be a patient responsible charge related to this service. Patient Location: at ome  Provider Location: [facility] Additional Individuals present: husband   HPI  URI: she states yesterday she noticed nasal congestion and this morning woke up with a sore throat. Denies rhinorrhea or cough. Denies SOB. She is feeling tired. Appetite is good . She states she does not leave her house frequently but her daughter goes visit her on a regular basis. Daughter was at her house this past weekend and yesterday. She has been using nose spray, she has not tried anything else. No rashes, nausea, vomiting, change in bowel movements   Last comp panel done 10/22 and GFR was 61  She is up to date with flu vaccine , only had 2 COVID vaccines in 2021.   Patient Active Problem List   Diagnosis Date Noted   Personal history of COVID-19 07/14/2019   Bilateral lower extremity edema 05/03/2018   Lower extremity pain, bilateral 05/03/2018   Diabetes mellitus type 2 in obese ([place]) 04/23/2018   Osteoarthritis of both knees 01/14/2018   Urge incontinence 03/14/2016   Osteopenia 02/13/2016   OSA (obstructive sleep apnea) 01/08/2016   Allergic rhinitis 04/13/2015   COPD (chronic obstructive pulmonary disease) ([place]) 09/18/2014   Morbid obesity ([place]) 09/18/2014   Esophagitis, reflux 09/18/2014   Essential hypertension 09/21/2006    Social History    Tobacco Use   Smoking status: Former    Packs/day: 2.00    Years: 40.00    Pack years: 80.00    Types: Cigarettes    Quit date: 2008    Years since quitting: 15.0   Smokeless tobacco: Former    Types: Snuff    Quit date: 04/2001   Tobacco comments:    smoking cessation materials not required  Substance Use Topics   Alcohol use: No    Alcohol/week: 0.0 standard drinks     Current Outpatient Medications:    albuterol (PROVENTIL) (2.5 MG/3ML) 0.083% nebulizer solution, Take 3 mLs (2.5 mg total) by nebulization every 6 (six) hours as needed for wheezing or shortness of breath., Disp: 150 mL, Rfl: 1   aspirin 81 MG tablet, Take 1 tablet (81 mg total) by mouth daily., Disp: 30 tablet, Rfl: 0   atorvastatin (LIPITOR) 40 MG tablet, Take 1 tablet (40 mg total) by mouth daily., Disp: 90 tablet, Rfl: 1   azelastine (ASTELIN) 0.1 % nasal spray, Place 2 sprays into both nostrils 2 (two) times daily as needed for rhinitis or allergies., Disp: 30 mL, Rfl: 1   baclofen (LIORESAL) 10 MG tablet, Take 10 mg by mouth daily., Disp: , Rfl:    calcium carbonate (OSCAL) 1500 (600 Ca) MG TABS tablet, Take 600 mg of elemental calcium by mouth 2 (two) times daily with a meal., Disp: , Rfl:    celecoxib (CELEBREX) 200 MG capsule, Take 200 mg by  mouth daily., Disp: , Rfl:    Cetirizine HCl (ZYRTEC ALLERGY) 10 MG TBDP, Take 10 mg by mouth at bedtime. For sinus symptoms and seasonal allergies, Disp: 30 tablet, Rfl: 1   docusate sodium (COLACE) 100 MG capsule, Take 100 mg by mouth daily., Disp: , Rfl:    fluticasone (FLONASE) 50 MCG/ACT nasal spray, , Disp: , Rfl:    Fluticasone-Umeclidin-Vilant (TRELEGY ELLIPTA) 100-62.5-25 MCG/INH AEPB, Inhale 1 puff into the lungs daily. In place of Stiolto, Disp: 180 each, Rfl: 1   glucose blood (ACCU-CHEK AVIVA PLUS) test strip, Use as instructed, Disp: 100 each, Rfl: 12   Lancets (ACCU-CHEK SOFT TOUCH) lancets, Use as instructed, Disp: 100 each, Rfl: 12   loratadine  (CLARITIN) 10 MG tablet, Take 1 tablet (10 mg total) by mouth daily., Disp: 90 tablet, Rfl: 1   meloxicam (MOBIC) 15 MG tablet, Take 15 mg by mouth daily., Disp: , Rfl:    metFORMIN (GLUCOPHAGE-XR) 750 MG 24 hr tablet, Take 1 tablet (750 mg total) by mouth daily with breakfast., Disp: 90 tablet, Rfl: 1   montelukast (SINGULAIR) 10 MG tablet, Take 1 tablet (10 mg total) by mouth at bedtime., Disp: 90 tablet, Rfl: 1   Multiple Vitamins-Minerals (MULTIVITAL PO), Take 1 tablet by mouth daily., Disp: , Rfl:    omeprazole (PRILOSEC) 20 MG capsule, Take 1 capsule (20 mg total) by mouth daily., Disp: 90 capsule, Rfl: 1   ORTHOVISC 30 MG/2ML SOSY, , Disp: , Rfl:    triamterene-hydrochlorothiazide (DYAZIDE) 37.5-25 MG capsule, Take 1 each (1 capsule total) by mouth daily., Disp: 90 capsule, Rfl: 1   vitamin B-12 (CYANOCOBALAMIN) 100 MCG tablet, Take 100 mcg by mouth daily., Disp: , Rfl:   No Known Allergies  I personally reviewed active problem list, medication list, allergies with the patient/caregiver today.  ROS  Ten systems reviewed and is negative except as mentioned in HPI   Objective  Virtual encounter, vitals not obtained.  Body mass index is 44.76 kg/m.  Nursing Note and Vital Signs reviewed.  Physical Exam  Awake, alert and oriented , sounds nasally   Assessment & Plan  1. Viral upper respiratory tract infection  - POCT Influenza A/B - Novel Coronavirus, NAA ([facility])   -Red flags and when to present for emergency care or RTC including fever >101.22F, chest pain, shortness of breath, new/worsening/un-resolving symptoms,  reviewed with patient at time of visit. Follow up and care instructions discussed and provided in AVS. - I discussed the assessment and treatment plan with the patient. The patient was provided an opportunity to ask questions and all were answered. The patient agreed with the plan and demonstrated an understanding of the instructions.  - The patient was advised  to call back or seek an in-person evaluation if the symptoms worsen or if the condition fails to improve as anticipated.  I provided 15  minutes of non-face-to-face time during this encounter.  Loistine Chance, MD

## 2021-05-01 LAB — SPECIMEN STATUS REPORT

## 2021-05-01 LAB — NOVEL CORONAVIRUS, NAA: SARS-CoV-2, NAA: NOT DETECTED

## 2021-05-01 LAB — SARS-COV-2, NAA 2 DAY TAT

## 2021-05-02 ENCOUNTER — Other Ambulatory Visit: Payer: Self-pay | Admitting: Family Medicine

## 2021-05-02 ENCOUNTER — Ambulatory Visit: Payer: Medicare HMO | Admitting: Family Medicine

## 2021-05-02 DIAGNOSIS — I10 Essential (primary) hypertension: Secondary | ICD-10-CM

## 2021-05-04 ENCOUNTER — Emergency Department: Payer: Medicare HMO

## 2021-05-04 ENCOUNTER — Inpatient Hospital Stay
Admission: EM | Admit: 2021-05-04 | Discharge: 2021-06-13 | DRG: 003 | Disposition: A | Discharge status: Other Acute Inpatient Hospital | Payer: Medicare HMO | Attending: Internal Medicine | Admitting: Internal Medicine

## 2021-05-04 ENCOUNTER — Other Ambulatory Visit: Payer: Self-pay

## 2021-05-04 DIAGNOSIS — K7200 Acute and subacute hepatic failure without coma: Secondary | ICD-10-CM | POA: Diagnosis present

## 2021-05-04 DIAGNOSIS — K862 Cyst of pancreas: Secondary | ICD-10-CM | POA: Diagnosis not present

## 2021-05-04 DIAGNOSIS — R569 Unspecified convulsions: Secondary | ICD-10-CM | POA: Diagnosis not present

## 2021-05-04 DIAGNOSIS — L03116 Cellulitis of left lower limb: Secondary | ICD-10-CM | POA: Diagnosis present

## 2021-05-04 DIAGNOSIS — A483 Toxic shock syndrome: Secondary | ICD-10-CM | POA: Diagnosis present

## 2021-05-04 DIAGNOSIS — Z4682 Encounter for fitting and adjustment of non-vascular catheter: Secondary | ICD-10-CM | POA: Diagnosis not present

## 2021-05-04 DIAGNOSIS — Z87828 Personal history of other (healed) physical injury and trauma: Secondary | ICD-10-CM | POA: Diagnosis not present

## 2021-05-04 DIAGNOSIS — E440 Moderate protein-calorie malnutrition: Secondary | ICD-10-CM | POA: Diagnosis present

## 2021-05-04 DIAGNOSIS — B950 Streptococcus, group A, as the cause of diseases classified elsewhere: Secondary | ICD-10-CM

## 2021-05-04 DIAGNOSIS — J440 Chronic obstructive pulmonary disease with acute lower respiratory infection: Secondary | ICD-10-CM | POA: Diagnosis not present

## 2021-05-04 DIAGNOSIS — I609 Nontraumatic subarachnoid hemorrhage, unspecified: Secondary | ICD-10-CM | POA: Diagnosis not present

## 2021-05-04 DIAGNOSIS — S066X0A Traumatic subarachnoid hemorrhage without loss of consciousness, initial encounter: Secondary | ICD-10-CM | POA: Diagnosis not present

## 2021-05-04 DIAGNOSIS — R6521 Severe sepsis with septic shock: Secondary | ICD-10-CM | POA: Diagnosis not present

## 2021-05-04 DIAGNOSIS — Z430 Encounter for attention to tracheostomy: Secondary | ICD-10-CM | POA: Diagnosis not present

## 2021-05-04 DIAGNOSIS — A408 Other streptococcal sepsis: Secondary | ICD-10-CM | POA: Diagnosis not present

## 2021-05-04 DIAGNOSIS — K8689 Other specified diseases of pancreas: Secondary | ICD-10-CM

## 2021-05-04 DIAGNOSIS — E119 Type 2 diabetes mellitus without complications: Secondary | ICD-10-CM | POA: Diagnosis not present

## 2021-05-04 DIAGNOSIS — J9620 Acute and chronic respiratory failure, unspecified whether with hypoxia or hypercapnia: Secondary | ICD-10-CM

## 2021-05-04 DIAGNOSIS — E11649 Type 2 diabetes mellitus with hypoglycemia without coma: Secondary | ICD-10-CM | POA: Diagnosis present

## 2021-05-04 DIAGNOSIS — Z6841 Body Mass Index (BMI) 40.0 and over, adult: Secondary | ICD-10-CM

## 2021-05-04 DIAGNOSIS — K9423 Gastrostomy malfunction: Secondary | ICD-10-CM | POA: Diagnosis not present

## 2021-05-04 DIAGNOSIS — J441 Chronic obstructive pulmonary disease with (acute) exacerbation: Secondary | ICD-10-CM | POA: Diagnosis not present

## 2021-05-04 DIAGNOSIS — Z95810 Presence of automatic (implantable) cardiac defibrillator: Secondary | ICD-10-CM | POA: Diagnosis not present

## 2021-05-04 DIAGNOSIS — G253 Myoclonus: Secondary | ICD-10-CM | POA: Diagnosis not present

## 2021-05-04 DIAGNOSIS — Z452 Encounter for adjustment and management of vascular access device: Secondary | ICD-10-CM | POA: Diagnosis not present

## 2021-05-04 DIAGNOSIS — R001 Bradycardia, unspecified: Secondary | ICD-10-CM | POA: Diagnosis not present

## 2021-05-04 DIAGNOSIS — E1165 Type 2 diabetes mellitus with hyperglycemia: Secondary | ICD-10-CM | POA: Diagnosis present

## 2021-05-04 DIAGNOSIS — G9340 Encephalopathy, unspecified: Secondary | ICD-10-CM

## 2021-05-04 DIAGNOSIS — B955 Unspecified streptococcus as the cause of diseases classified elsewhere: Secondary | ICD-10-CM

## 2021-05-04 DIAGNOSIS — I480 Paroxysmal atrial fibrillation: Secondary | ICD-10-CM | POA: Diagnosis present

## 2021-05-04 DIAGNOSIS — I878 Other specified disorders of veins: Secondary | ICD-10-CM | POA: Diagnosis not present

## 2021-05-04 DIAGNOSIS — E669 Obesity, unspecified: Secondary | ICD-10-CM | POA: Diagnosis present

## 2021-05-04 DIAGNOSIS — D72829 Elevated white blood cell count, unspecified: Secondary | ICD-10-CM | POA: Diagnosis not present

## 2021-05-04 DIAGNOSIS — E8809 Other disorders of plasma-protein metabolism, not elsewhere classified: Secondary | ICD-10-CM | POA: Diagnosis not present

## 2021-05-04 DIAGNOSIS — G4730 Sleep apnea, unspecified: Secondary | ICD-10-CM | POA: Diagnosis not present

## 2021-05-04 DIAGNOSIS — I959 Hypotension, unspecified: Secondary | ICD-10-CM | POA: Diagnosis not present

## 2021-05-04 DIAGNOSIS — Z791 Long term (current) use of non-steroidal anti-inflammatories (NSAID): Secondary | ICD-10-CM

## 2021-05-04 DIAGNOSIS — K7460 Unspecified cirrhosis of liver: Secondary | ICD-10-CM | POA: Diagnosis not present

## 2021-05-04 DIAGNOSIS — Z20822 Contact with and (suspected) exposure to covid-19: Secondary | ICD-10-CM | POA: Diagnosis present

## 2021-05-04 DIAGNOSIS — E8721 Acute metabolic acidosis: Secondary | ICD-10-CM | POA: Diagnosis not present

## 2021-05-04 DIAGNOSIS — R0689 Other abnormalities of breathing: Secondary | ICD-10-CM | POA: Diagnosis not present

## 2021-05-04 DIAGNOSIS — E1169 Type 2 diabetes mellitus with other specified complication: Secondary | ICD-10-CM | POA: Diagnosis present

## 2021-05-04 DIAGNOSIS — Z9911 Dependence on respirator [ventilator] status: Secondary | ICD-10-CM

## 2021-05-04 DIAGNOSIS — Z8616 Personal history of COVID-19: Secondary | ICD-10-CM | POA: Diagnosis not present

## 2021-05-04 DIAGNOSIS — G9341 Metabolic encephalopathy: Secondary | ICD-10-CM | POA: Diagnosis present

## 2021-05-04 DIAGNOSIS — M79605 Pain in left leg: Secondary | ICD-10-CM | POA: Diagnosis not present

## 2021-05-04 DIAGNOSIS — R1310 Dysphagia, unspecified: Secondary | ICD-10-CM | POA: Diagnosis not present

## 2021-05-04 DIAGNOSIS — G931 Anoxic brain damage, not elsewhere classified: Secondary | ICD-10-CM | POA: Diagnosis not present

## 2021-05-04 DIAGNOSIS — R918 Other nonspecific abnormal finding of lung field: Secondary | ICD-10-CM | POA: Diagnosis not present

## 2021-05-04 DIAGNOSIS — R0602 Shortness of breath: Secondary | ICD-10-CM | POA: Diagnosis not present

## 2021-05-04 DIAGNOSIS — Z515 Encounter for palliative care: Secondary | ICD-10-CM | POA: Diagnosis not present

## 2021-05-04 DIAGNOSIS — J9602 Acute respiratory failure with hypercapnia: Secondary | ICD-10-CM | POA: Diagnosis not present

## 2021-05-04 DIAGNOSIS — R1110 Vomiting, unspecified: Secondary | ICD-10-CM | POA: Diagnosis not present

## 2021-05-04 DIAGNOSIS — A491 Streptococcal infection, unspecified site: Secondary | ICD-10-CM | POA: Diagnosis not present

## 2021-05-04 DIAGNOSIS — A400 Sepsis due to streptococcus, group A: Principal | ICD-10-CM | POA: Diagnosis present

## 2021-05-04 DIAGNOSIS — R52 Pain, unspecified: Secondary | ICD-10-CM | POA: Diagnosis not present

## 2021-05-04 DIAGNOSIS — Z01818 Encounter for other preprocedural examination: Secondary | ICD-10-CM

## 2021-05-04 DIAGNOSIS — R609 Edema, unspecified: Secondary | ICD-10-CM | POA: Diagnosis not present

## 2021-05-04 DIAGNOSIS — I6200 Nontraumatic subdural hemorrhage, unspecified: Secondary | ICD-10-CM

## 2021-05-04 DIAGNOSIS — D6489 Other specified anemias: Secondary | ICD-10-CM | POA: Diagnosis not present

## 2021-05-04 DIAGNOSIS — E871 Hypo-osmolality and hyponatremia: Secondary | ICD-10-CM | POA: Diagnosis not present

## 2021-05-04 DIAGNOSIS — R600 Localized edema: Secondary | ICD-10-CM | POA: Diagnosis not present

## 2021-05-04 DIAGNOSIS — Z811 Family history of alcohol abuse and dependence: Secondary | ICD-10-CM

## 2021-05-04 DIAGNOSIS — N809 Endometriosis, unspecified: Secondary | ICD-10-CM | POA: Diagnosis present

## 2021-05-04 DIAGNOSIS — N186 End stage renal disease: Secondary | ICD-10-CM | POA: Diagnosis not present

## 2021-05-04 DIAGNOSIS — I517 Cardiomegaly: Secondary | ICD-10-CM | POA: Diagnosis not present

## 2021-05-04 DIAGNOSIS — R935 Abnormal findings on diagnostic imaging of other abdominal regions, including retroperitoneum: Secondary | ICD-10-CM | POA: Diagnosis not present

## 2021-05-04 DIAGNOSIS — J90 Pleural effusion, not elsewhere classified: Secondary | ICD-10-CM | POA: Diagnosis not present

## 2021-05-04 DIAGNOSIS — R509 Fever, unspecified: Secondary | ICD-10-CM | POA: Diagnosis not present

## 2021-05-04 DIAGNOSIS — A419 Sepsis, unspecified organism: Secondary | ICD-10-CM | POA: Diagnosis not present

## 2021-05-04 DIAGNOSIS — D631 Anemia in chronic kidney disease: Secondary | ICD-10-CM | POA: Diagnosis present

## 2021-05-04 DIAGNOSIS — J9600 Acute respiratory failure, unspecified whether with hypoxia or hypercapnia: Secondary | ICD-10-CM | POA: Diagnosis not present

## 2021-05-04 DIAGNOSIS — D500 Iron deficiency anemia secondary to blood loss (chronic): Secondary | ICD-10-CM | POA: Diagnosis not present

## 2021-05-04 DIAGNOSIS — J9811 Atelectasis: Secondary | ICD-10-CM | POA: Diagnosis not present

## 2021-05-04 DIAGNOSIS — E1122 Type 2 diabetes mellitus with diabetic chronic kidney disease: Secondary | ICD-10-CM | POA: Diagnosis present

## 2021-05-04 DIAGNOSIS — I5033 Acute on chronic diastolic (congestive) heart failure: Secondary | ICD-10-CM | POA: Diagnosis present

## 2021-05-04 DIAGNOSIS — N2581 Secondary hyperparathyroidism of renal origin: Secondary | ICD-10-CM | POA: Diagnosis present

## 2021-05-04 DIAGNOSIS — N170 Acute kidney failure with tubular necrosis: Secondary | ICD-10-CM | POA: Diagnosis not present

## 2021-05-04 DIAGNOSIS — R109 Unspecified abdominal pain: Secondary | ICD-10-CM | POA: Diagnosis not present

## 2021-05-04 DIAGNOSIS — J811 Chronic pulmonary edema: Secondary | ICD-10-CM | POA: Diagnosis not present

## 2021-05-04 DIAGNOSIS — E872 Acidosis, unspecified: Secondary | ICD-10-CM | POA: Diagnosis present

## 2021-05-04 DIAGNOSIS — I4892 Unspecified atrial flutter: Secondary | ICD-10-CM | POA: Diagnosis not present

## 2021-05-04 DIAGNOSIS — D734 Cyst of spleen: Secondary | ICD-10-CM | POA: Diagnosis not present

## 2021-05-04 DIAGNOSIS — F32A Depression, unspecified: Secondary | ICD-10-CM | POA: Diagnosis not present

## 2021-05-04 DIAGNOSIS — R140 Abdominal distension (gaseous): Secondary | ICD-10-CM

## 2021-05-04 DIAGNOSIS — N179 Acute kidney failure, unspecified: Secondary | ICD-10-CM

## 2021-05-04 DIAGNOSIS — B965 Pseudomonas (aeruginosa) (mallei) (pseudomallei) as the cause of diseases classified elsewhere: Secondary | ICD-10-CM | POA: Diagnosis not present

## 2021-05-04 DIAGNOSIS — G4733 Obstructive sleep apnea (adult) (pediatric): Secondary | ICD-10-CM | POA: Diagnosis present

## 2021-05-04 DIAGNOSIS — E875 Hyperkalemia: Secondary | ICD-10-CM | POA: Diagnosis not present

## 2021-05-04 DIAGNOSIS — J189 Pneumonia, unspecified organism: Secondary | ICD-10-CM | POA: Diagnosis not present

## 2021-05-04 DIAGNOSIS — D62 Acute posthemorrhagic anemia: Secondary | ICD-10-CM | POA: Diagnosis not present

## 2021-05-04 DIAGNOSIS — J449 Chronic obstructive pulmonary disease, unspecified: Secondary | ICD-10-CM | POA: Diagnosis present

## 2021-05-04 DIAGNOSIS — I953 Hypotension of hemodialysis: Secondary | ICD-10-CM | POA: Diagnosis not present

## 2021-05-04 DIAGNOSIS — Z87891 Personal history of nicotine dependence: Secondary | ICD-10-CM | POA: Diagnosis not present

## 2021-05-04 DIAGNOSIS — M726 Necrotizing fasciitis: Secondary | ICD-10-CM | POA: Diagnosis not present

## 2021-05-04 DIAGNOSIS — R0902 Hypoxemia: Secondary | ICD-10-CM | POA: Diagnosis not present

## 2021-05-04 DIAGNOSIS — R7881 Bacteremia: Secondary | ICD-10-CM | POA: Diagnosis not present

## 2021-05-04 DIAGNOSIS — Z930 Tracheostomy status: Secondary | ICD-10-CM

## 2021-05-04 DIAGNOSIS — K219 Gastro-esophageal reflux disease without esophagitis: Secondary | ICD-10-CM | POA: Diagnosis present

## 2021-05-04 DIAGNOSIS — J151 Pneumonia due to Pseudomonas: Secondary | ICD-10-CM | POA: Diagnosis not present

## 2021-05-04 DIAGNOSIS — Z90710 Acquired absence of both cervix and uterus: Secondary | ICD-10-CM

## 2021-05-04 DIAGNOSIS — J9601 Acute respiratory failure with hypoxia: Secondary | ICD-10-CM | POA: Diagnosis not present

## 2021-05-04 DIAGNOSIS — J9503 Malfunction of tracheostomy stoma: Secondary | ICD-10-CM | POA: Diagnosis not present

## 2021-05-04 DIAGNOSIS — I509 Heart failure, unspecified: Secondary | ICD-10-CM | POA: Diagnosis not present

## 2021-05-04 DIAGNOSIS — E876 Hypokalemia: Secondary | ICD-10-CM | POA: Diagnosis not present

## 2021-05-04 DIAGNOSIS — Z978 Presence of other specified devices: Secondary | ICD-10-CM | POA: Diagnosis not present

## 2021-05-04 DIAGNOSIS — K5730 Diverticulosis of large intestine without perforation or abscess without bleeding: Secondary | ICD-10-CM | POA: Diagnosis not present

## 2021-05-04 DIAGNOSIS — D6959 Other secondary thrombocytopenia: Secondary | ICD-10-CM | POA: Diagnosis not present

## 2021-05-04 DIAGNOSIS — K8020 Calculus of gallbladder without cholecystitis without obstruction: Secondary | ICD-10-CM | POA: Diagnosis not present

## 2021-05-04 DIAGNOSIS — K3189 Other diseases of stomach and duodenum: Secondary | ICD-10-CM | POA: Diagnosis not present

## 2021-05-04 DIAGNOSIS — E785 Hyperlipidemia, unspecified: Secondary | ICD-10-CM | POA: Diagnosis present

## 2021-05-04 DIAGNOSIS — I4891 Unspecified atrial fibrillation: Secondary | ICD-10-CM

## 2021-05-04 DIAGNOSIS — Z0189 Encounter for other specified special examinations: Secondary | ICD-10-CM

## 2021-05-04 DIAGNOSIS — M79662 Pain in left lower leg: Secondary | ICD-10-CM | POA: Diagnosis not present

## 2021-05-04 DIAGNOSIS — R6520 Severe sepsis without septic shock: Secondary | ICD-10-CM | POA: Diagnosis not present

## 2021-05-04 DIAGNOSIS — Z7982 Long term (current) use of aspirin: Secondary | ICD-10-CM

## 2021-05-04 DIAGNOSIS — S81802A Unspecified open wound, left lower leg, initial encounter: Secondary | ICD-10-CM | POA: Diagnosis not present

## 2021-05-04 DIAGNOSIS — Z4901 Encounter for fitting and adjustment of extracorporeal dialysis catheter: Secondary | ICD-10-CM | POA: Diagnosis not present

## 2021-05-04 DIAGNOSIS — L089 Local infection of the skin and subcutaneous tissue, unspecified: Secondary | ICD-10-CM | POA: Diagnosis not present

## 2021-05-04 DIAGNOSIS — Z803 Family history of malignant neoplasm of breast: Secondary | ICD-10-CM

## 2021-05-04 DIAGNOSIS — R4182 Altered mental status, unspecified: Secondary | ICD-10-CM | POA: Diagnosis not present

## 2021-05-04 DIAGNOSIS — E878 Other disorders of electrolyte and fluid balance, not elsewhere classified: Secondary | ICD-10-CM | POA: Diagnosis not present

## 2021-05-04 DIAGNOSIS — R9431 Abnormal electrocardiogram [ECG] [EKG]: Secondary | ICD-10-CM | POA: Diagnosis not present

## 2021-05-04 DIAGNOSIS — I132 Hypertensive heart and chronic kidney disease with heart failure and with stage 5 chronic kidney disease, or end stage renal disease: Secondary | ICD-10-CM | POA: Diagnosis present

## 2021-05-04 DIAGNOSIS — D649 Anemia, unspecified: Secondary | ICD-10-CM | POA: Diagnosis not present

## 2021-05-04 DIAGNOSIS — K7689 Other specified diseases of liver: Secondary | ICD-10-CM | POA: Diagnosis not present

## 2021-05-04 DIAGNOSIS — Z7189 Other specified counseling: Secondary | ICD-10-CM | POA: Diagnosis not present

## 2021-05-04 DIAGNOSIS — Z79899 Other long term (current) drug therapy: Secondary | ICD-10-CM

## 2021-05-04 DIAGNOSIS — J9621 Acute and chronic respiratory failure with hypoxia: Secondary | ICD-10-CM | POA: Diagnosis not present

## 2021-05-04 DIAGNOSIS — J9690 Respiratory failure, unspecified, unspecified whether with hypoxia or hypercapnia: Secondary | ICD-10-CM | POA: Diagnosis not present

## 2021-05-04 DIAGNOSIS — R401 Stupor: Secondary | ICD-10-CM | POA: Diagnosis not present

## 2021-05-04 DIAGNOSIS — Z8249 Family history of ischemic heart disease and other diseases of the circulatory system: Secondary | ICD-10-CM

## 2021-05-04 DIAGNOSIS — Z7984 Long term (current) use of oral hypoglycemic drugs: Secondary | ICD-10-CM

## 2021-05-04 HISTORY — DX: Personal history of other medical treatment: Z92.89

## 2021-05-04 HISTORY — DX: Paroxysmal atrial fibrillation: I48.0

## 2021-05-04 HISTORY — DX: Bacteremia: R78.81

## 2021-05-04 HISTORY — DX: Acute kidney failure, unspecified: N17.9

## 2021-05-04 LAB — CBC WITH DIFFERENTIAL/PLATELET
Abs Immature Granulocytes: 0.14 10*3/uL — ABNORMAL HIGH (ref 0.00–0.07)
Basophils Absolute: 0.1 10*3/uL (ref 0.0–0.1)
Basophils Relative: 0 %
Eosinophils Absolute: 0.1 10*3/uL (ref 0.0–0.5)
Eosinophils Relative: 1 %
HCT: 40.8 % (ref 36.0–46.0)
Hemoglobin: 12.5 g/dL (ref 12.0–15.0)
Immature Granulocytes: 1 %
Lymphocytes Relative: 2 %
Lymphs Abs: 0.3 10*3/uL — ABNORMAL LOW (ref 0.7–4.0)
MCH: 24 pg — ABNORMAL LOW (ref 26.0–34.0)
MCHC: 30.6 g/dL (ref 30.0–36.0)
MCV: 78.5 fL — ABNORMAL LOW (ref 80.0–100.0)
Monocytes Absolute: 0.4 10*3/uL (ref 0.1–1.0)
Monocytes Relative: 3 %
Neutro Abs: 15.5 10*3/uL — ABNORMAL HIGH (ref 1.7–7.7)
Neutrophils Relative %: 93 %
Platelets: 282 10*3/uL (ref 150–400)
RBC: 5.2 MIL/uL — ABNORMAL HIGH (ref 3.87–5.11)
RDW: 14.6 % (ref 11.5–15.5)
Smear Review: NORMAL
WBC Morphology: INCREASED
WBC: 16.5 10*3/uL — ABNORMAL HIGH (ref 4.0–10.5)
nRBC: 0.2 % (ref 0.0–0.2)

## 2021-05-04 LAB — COMPREHENSIVE METABOLIC PANEL
ALT: 48 U/L — ABNORMAL HIGH (ref 0–44)
AST: 67 U/L — ABNORMAL HIGH (ref 15–41)
Albumin: 2.8 g/dL — ABNORMAL LOW (ref 3.5–5.0)
Alkaline Phosphatase: 103 U/L (ref 38–126)
Anion gap: 16 — ABNORMAL HIGH (ref 5–15)
BUN: 44 mg/dL — ABNORMAL HIGH (ref 8–23)
CO2: 26 mmol/L (ref 22–32)
Calcium: 8.7 mg/dL — ABNORMAL LOW (ref 8.9–10.3)
Chloride: 99 mmol/L (ref 98–111)
Creatinine, Ser: 3.47 mg/dL — ABNORMAL HIGH (ref 0.44–1.00)
GFR, Estimated: 13 mL/min — ABNORMAL LOW (ref >60)
Glucose, Bld: 144 mg/dL — ABNORMAL HIGH (ref 70–99)
Potassium: 2.9 mmol/L — ABNORMAL LOW (ref 3.5–5.1)
Sodium: 141 mmol/L (ref 135–145)
Total Bilirubin: 1.1 mg/dL (ref 0.3–1.2)
Total Protein: 7.9 g/dL (ref 6.5–8.1)

## 2021-05-04 LAB — RESP PANEL BY RT-PCR (FLU A&B, COVID) ARPGX2
Influenza A by PCR: NEGATIVE
Influenza B by PCR: NEGATIVE
SARS Coronavirus 2 by RT PCR: NEGATIVE

## 2021-05-04 LAB — LACTIC ACID, PLASMA
Lactic Acid, Venous: 3.8 mmol/L (ref 0.5–1.9)
Lactic Acid, Venous: 6 mmol/L (ref 0.5–1.9)

## 2021-05-04 MED ORDER — LACTATED RINGERS IV BOLUS (SEPSIS)
1000.0000 mL | Freq: Once | INTRAVENOUS | Status: AC
  Administered 2021-05-04: 1000 mL via INTRAVENOUS

## 2021-05-04 MED ORDER — VANCOMYCIN HCL IN DEXTROSE 1-5 GM/200ML-% IV SOLN
1000.0000 mg | Freq: Once | INTRAVENOUS | Status: AC
  Administered 2021-05-04: 1000 mg via INTRAVENOUS
  Filled 2021-05-04: qty 200

## 2021-05-04 MED ORDER — POLYETHYLENE GLYCOL 3350 17 G PO PACK
17.0000 g | PACK | Freq: Every day | ORAL | Status: DC | PRN
  Administered 2021-05-09: 17 g via ORAL

## 2021-05-04 MED ORDER — DOCUSATE SODIUM 100 MG PO CAPS: 100.0000 mg | ORAL_CAPSULE | Freq: Two times a day (BID) | ORAL | Status: DC | PRN

## 2021-05-04 MED ORDER — SODIUM CHLORIDE 0.9 % IV BOLUS
1000.0000 mL | Freq: Once | INTRAVENOUS | Status: AC
  Administered 2021-05-04: 1000 mL via INTRAVENOUS

## 2021-05-04 MED ORDER — FENTANYL CITRATE PF 50 MCG/ML IJ SOSY
50.0000 ug | PREFILLED_SYRINGE | Freq: Once | INTRAMUSCULAR | Status: AC
  Administered 2021-05-05: 50 ug via INTRAVENOUS
  Filled 2021-05-04: qty 1

## 2021-05-04 MED ORDER — LACTATED RINGERS IV BOLUS (SEPSIS)
800.0000 mL | Freq: Once | INTRAVENOUS | Status: AC
  Administered 2021-05-04: 800 mL via INTRAVENOUS

## 2021-05-04 MED ORDER — VANCOMYCIN HCL IN DEXTROSE 1-5 GM/200ML-% IV SOLN: 1000.0000 mg | Freq: Once | INTRAVENOUS | Status: DC

## 2021-05-04 MED ORDER — SODIUM CHLORIDE 0.9 % IV SOLN
2.0000 g | Freq: Once | INTRAVENOUS | Status: AC
  Administered 2021-05-04: 2 g via INTRAVENOUS
  Filled 2021-05-04: qty 20

## 2021-05-04 MED ORDER — VANCOMYCIN HCL 1500 MG/300ML IV SOLN
1500.0000 mg | Freq: Once | INTRAVENOUS | Status: AC
  Administered 2021-05-05: 1500 mg via INTRAVENOUS
  Filled 2021-05-04: qty 300

## 2021-05-04 MED ORDER — POTASSIUM CHLORIDE 10 MEQ/100ML IV SOLN
10.0000 meq | INTRAVENOUS | Status: AC
  Administered 2021-05-05 (×3): 10 meq via INTRAVENOUS
  Filled 2021-05-04 (×3): qty 100

## 2021-05-04 MED ORDER — SODIUM CHLORIDE 0.9 % IV SOLN
2.0000 g | INTRAVENOUS | Status: DC
  Administered 2021-05-05: 2 g via INTRAVENOUS
  Filled 2021-05-04: qty 2

## 2021-05-04 MED ORDER — OXYCODONE HCL 5 MG PO TABS
10.0000 mg | ORAL_TABLET | Freq: Four times a day (QID) | ORAL | Status: DC | PRN
  Administered 2021-05-05 – 2021-05-10 (×4): 10 mg via ORAL
  Filled 2021-05-04 (×4): qty 2

## 2021-05-04 MED ORDER — HEPARIN SODIUM (PORCINE) 5000 UNIT/ML IJ SOLN
5000.0000 [IU] | Freq: Three times a day (TID) | INTRAMUSCULAR | Status: DC
  Administered 2021-05-05 – 2021-05-06 (×6): 5000 [IU] via SUBCUTANEOUS
  Filled 2021-05-04 (×6): qty 1

## 2021-05-04 MED ORDER — VANCOMYCIN VARIABLE DOSE PER UNSTABLE RENAL FUNCTION (PHARMACIST DOSING): Status: DC

## 2021-05-04 MED ORDER — ACETAMINOPHEN 325 MG PO TABS
650.0000 mg | ORAL_TABLET | Freq: Once | ORAL | Status: AC
  Administered 2021-05-05: 650 mg via ORAL
  Filled 2021-05-04: qty 2

## 2021-05-04 NOTE — ED Provider Notes (Signed)
[facility] Provider Note    Event Date/Time   First MD Initiated Contact with Patient 05/04/21 2042     (approximate)   History   Leg Pain   HPI  Alexandra Foster is a 71 y.o. female here with left leg pain.  Patient states that over the last several days, she has had progressively worsening aching, throbbing, left leg pain.  She states it began around her ankle and has spread up her leg.  She has had difficulty walking due to this.  She said decreased appetite.  She has had fevers and chills.  Denies history of cellulitis.  She does not recall hitting it or having any wounds there.  She said no drainage from the area.  She has been increasingly weak today which is what brought her to the ER.  Denies any specific alleviating factors.  The pain is severe but particularly worse with any movement or palpation.  It is felt hot and swollen.     Physical Exam   Triage Vital Signs: ED Triage Vitals  Enc Vitals Group     BP 05/04/21 1940 (!) 101/55     Pulse Rate 05/04/21 1940 96     Resp 05/04/21 1940 (!) 22     Temp 05/04/21 1940 99.9 F (37.7 C)     Temp Source 05/04/21 1940 Oral     SpO2 05/04/21 1940 94 %     Weight 05/04/21 1941 266 lb (120.7 kg)     Height 05/04/21 1941 5\' 5"  (1.651 m)     Head Circumference --      Peak Flow --      Pain Score 05/04/21 1940 10     Pain Loc --      Pain Edu? --      Excl. in [place]? --     Most recent vital signs: Vitals:   05/04/21 2255 05/04/21 2300  BP: 111/73 (!) 103/55  Pulse: (!) 102 (!) 101  Resp: (!) 24 (!) 26  Temp:    SpO2: 100% 96%     General: Awake, no distress.  CV:  Good peripheral perfusion.  No murmurs or rubs. Resp:  Normal effort.  Lungs clear bilaterally Abd:  No distention.  No tenderness Other:  Left lower extremity with marked edema, warmth, cellulitis, and induration throughout the mid to lower left leg, circumferential, extending to the ankle.  Ankle range of motion is minimally  painful.  No appreciable fluctuance or focal abscess.  No appreciable crepitance.  Distal DP pulse 2+, toes warm and well-perfused.   ED Results / Procedures / Treatments   Labs (all labs ordered are listed, but only abnormal results are displayed) Labs Reviewed  LACTIC ACID, PLASMA - Abnormal; Notable for the following components:      Result Value   Lactic Acid, Venous 3.8 (*)    All other components within normal limits  COMPREHENSIVE METABOLIC PANEL - Abnormal; Notable for the following components:   Potassium 2.9 (*)    Glucose, Bld 144 (*)    BUN 44 (*)    Creatinine, Ser 3.47 (*)    Calcium 8.7 (*)    Albumin 2.8 (*)    AST 67 (*)    ALT 48 (*)    GFR, Estimated 13 (*)    Anion gap 16 (*)    All other components within normal limits  CBC WITH DIFFERENTIAL/PLATELET - Abnormal; Notable for the following components:   WBC 16.5 (*)  RBC 5.20 (*)    MCV 78.5 (*)    MCH 24.0 (*)    Neutro Abs 15.5 (*)    Lymphs Abs 0.3 (*)    Abs Immature Granulocytes 0.14 (*)    All other components within normal limits  LACTIC ACID, PLASMA - Abnormal; Notable for the following components:   Lactic Acid, Venous 6.0 (*)    All other components within normal limits  RESP PANEL BY RT-PCR (FLU A&B, COVID) ARPGX2  CULTURE, BLOOD (ROUTINE X 2)  CULTURE, BLOOD (ROUTINE X 2)  URINE CULTURE  URINE CULTURE  LACTIC ACID, PLASMA  APTT  URINALYSIS, COMPLETE (UACMP) WITH MICROSCOPIC  PROCALCITONIN  PROCALCITONIN  CBC  CREATININE, SERUM  LACTIC ACID, PLASMA  LACTIC ACID, PLASMA  CORTISOL  PROTIME-INR  APTT  D-DIMER, QUANTITATIVE  CBC  BASIC METABOLIC PANEL  MAGNESIUM  PHOSPHORUS       RADIOLOGY Chest x-ray: Clear CT soft tissue left: Diffuse subcutaneous edema likely from cellulitis, no focal abscess, no free air   I also independently reviewed and agree wit radiologist interpretations.   PROCEDURES:  Critical Care performed: Yes, see critical care procedure  note(s)  .Critical Care Performed by: Duffy Bruce, MD Authorized by: Duffy Bruce, MD   Critical care provider statement:    Critical care time (minutes):  30   Critical care time was exclusive of:  Separately billable procedures and treating other patients   Critical care was necessary to treat or prevent imminent or life-threatening deterioration of the following conditions:  Cardiac failure, circulatory failure and respiratory failure   Critical care was time spent personally by me on the following activities:  Development of treatment plan with patient or surrogate, discussions with consultants, evaluation of patient's response to treatment, examination of patient, ordering and review of laboratory studies, ordering and review of radiographic studies, ordering and performing treatments and interventions, pulse oximetry, re-evaluation of patient's condition and review of old [place] ED: Medications  acetaminophen (TYLENOL) tablet 650 mg (has no administration in time range)  sodium chloride 0.9 % bolus 1,000 mL (has no administration in time range)  potassium chloride 10 mEq in 100 mL IVPB (has no administration in time range)  docusate sodium (COLACE) capsule 100 mg (has no administration in time range)  polyethylene glycol (MIRALAX / GLYCOLAX) packet 17 g (has no administration in time range)  vancomycin (VANCOCIN) IVPB 1000 mg/200 mL premix (has no administration in time range)  ceFEPIme (MAXIPIME) 2 g in sodium chloride 0.9 % 100 mL IVPB (has no administration in time range)  heparin injection 5,000 Units (has no administration in time range)  lactated ringers bolus 1,000 mL (0 mLs Intravenous Stopped 05/04/21 2210)    And  lactated ringers bolus 800 mL (0 mLs Intravenous Stopped 05/04/21 2304)  vancomycin (VANCOCIN) IVPB 1000 mg/200 mL premix (0 mg Intravenous Stopped 05/04/21 2243)  cefTRIAXone (ROCEPHIN) 2 g in sodium chloride 0.9 % 100 mL IVPB (0 g  Intravenous Stopped 05/04/21 2206)     IMPRESSION / MDM / [place] / ED COURSE  I reviewed the triage vital signs and the nursing notes.                               The patient is on the cardiac monitor to evaluate for evidence of arrhythmia and/or significant heart rate changes.   Ddx:  Cellulitis, necrotizing infection, DVT, venous stasis with  secondary infection   MDM:  71 year old female here with severe sepsis secondary to left lower extremity cellulitis.  Patient hypotensive on arrival.  Initial lab work shows significant AKI with creatinine 3.4 and baseline less than 1, mild transaminitis, hypokalemia, as well as significant leukocytosis with bandemia.  Lactic acid 3.8.  This trended up to 6, but this was after lactated ringer fluid resuscitation and given her mild transaminitis, there could be a component of type II lactic acidosis.  Otherwise, CT of the lower extremity obtained secondary to her degree of illness and shows no evidence of neck fascia or free air.  Chest x-ray clear.  Patient given empiric, broad-spectrum antibiotics and resuscitated based on ideal body weight and given additional liter as well for her persistent lactic acidosis.  Patient will be admitted to the ICU.  Intensivist consulted, case discussed, and will admit the patient.   MEDICATIONS GIVEN IN ED: Medications  acetaminophen (TYLENOL) tablet 650 mg (has no administration in time range)  sodium chloride 0.9 % bolus 1,000 mL (has no administration in time range)  potassium chloride 10 mEq in 100 mL IVPB (has no administration in time range)  docusate sodium (COLACE) capsule 100 mg (has no administration in time range)  polyethylene glycol (MIRALAX / GLYCOLAX) packet 17 g (has no administration in time range)  vancomycin (VANCOCIN) IVPB 1000 mg/200 mL premix (has no administration in time range)  ceFEPIme (MAXIPIME) 2 g in sodium chloride 0.9 % 100 mL IVPB (has no administration in time range)   heparin injection 5,000 Units (has no administration in time range)  lactated ringers bolus 1,000 mL (0 mLs Intravenous Stopped 05/04/21 2210)    And  lactated ringers bolus 800 mL (0 mLs Intravenous Stopped 05/04/21 2304)  vancomycin (VANCOCIN) IVPB 1000 mg/200 mL premix (0 mg Intravenous Stopped 05/04/21 2243)  cefTRIAXone (ROCEPHIN) 2 g in sodium chloride 0.9 % 100 mL IVPB (0 g Intravenous Stopped 05/04/21 2206)     Consults:  Intensivist   EMR reviewed  Office visit 11/14 for DM and osteoarthritis 04/30/21 telephone visit for URI sx     FINAL CLINICAL IMPRESSION(S) / ED DIAGNOSES   Final diagnoses:  Severe sepsis ([place])  Left leg cellulitis  AKI (acute kidney injury) ([place])     Rx / DC Orders   ED Discharge Orders     None        Note:  This document was prepared using Dragon voice recognition software and may include unintentional dictation errors.   Duffy Bruce, MD 05/04/21 [phone]

## 2021-05-04 NOTE — ED Triage Notes (Signed)
Pt presents to ER c/o LLE pain since Thursday.  Pt denies any injury to leg.  LLE appears red, warm and swollen. Pt A&O x4, in NAD at this time.

## 2021-05-04 NOTE — ED Notes (Signed)
Hospitalist at bedside 

## 2021-05-04 NOTE — ED Notes (Signed)
First Nurse Note:   Patient in via [facility] from home, reports left lower leg pain since Tuesday, but worsening today.  NAD noted upon arrival.  EMS Vitals: HR 98 SpO2: 98% Temp 98.2 CBG 208

## 2021-05-04 NOTE — Consult Note (Signed)
CODE SEPSIS - PHARMACY COMMUNICATION  **Broad Spectrum Antibiotics should be administered within 1 hour of Sepsis diagnosis**  Time Code Sepsis Called/Page Received: 2040  Antibiotics Ordered: 2040  Time of 1st antibiotic administration: 2136  Additional action taken by pharmacy: N/A  If necessary, Name of Provider/Nurse Contacted: N/A    Darnelle Bos ,PharmD Clinical Pharmacist  05/04/2021  8:42 PM

## 2021-05-04 NOTE — ED Notes (Signed)
Per Dr. Ellender Hose, both liters of LR on pressure bags at this time.

## 2021-05-04 NOTE — Consult Note (Signed)
PHARMACY -  BRIEF ANTIBIOTIC NOTE   Pharmacy has received consult(s) for vancomycin from an ED provider.  The patient's profile has been reviewed for ht/wt/allergies/indication/available labs.    One time order(s) placed for vancomycin 1 g  Further antibiotics/pharmacy consults should be ordered by admitting physician if indicated.                       Thank you, Darnelle Bos, PharmD 05/04/2021  8:43 PM

## 2021-05-04 NOTE — H&P (Signed)
NAME:  Alexandra Foster, MRN:  [id number], DOB:  [birth year], LOS: 0 ADMISSION DATE:  05/04/2021, CONSULTATION DATE:  05/04/2021 REFERRING MD:  Duffy Bruce MD CHIEF COMPLAINT:  Left leg pain    HPI  71 y.o female with medical history significant of Asthma/COPD, T2DM, HTN, OSA on CPAP, GERD, COVID-19 pneumonia, and Bilateral lower extremity edema who presented to the ED with chief complaints of LLE pain and chills since Thursday.  Patient report onset of symptoms for the past several days with worsening pain, swelling, fevers and chills, poor po intake and difficulty walking since Thursday. Denies injury to extremity or hx of cellulitis.  ED Course: In the emergency department, the temperature was 37.7C, the heart rate 103 beats/minute, the blood pressure 75/64  mm Hg, the respiratory rate 27 breaths/minute, and the oxygen saturation 99% on RA. CT of left lower leg showed diffuse subcutaneous edema concerning for cellulitis with no fluid collection/abscess. Pertinent Labs in Red/Diagnostics Findings: Na+/ K+: 141/2.9 Glucose: 144 BUN/Cr.: 44/3.47 AST/ALT:67/48   WBC/ TMAX: 16.5/ afebrile PCT: pending Lactic acid: 3.8>6.0 COVID PCR: Negative  Patient given 30 cc/kg of fluids and started on broad-spectrum antibiotics for sepsis with septic shock. Patient remained hypotensive despite IVF boluses therefore was started on Levophed. PCCM consulted.  Past Medical History    Arthritis   Asthma   COPD (chronic obstructive pulmonary disease) ([place])   Diabetes mellitus without complication ([place])   Endometriosis   GERD (gastroesophageal reflux disease)   Hypertension   Obesity   Sleep apnea    CPAP   Significant Hospital Events   05/04/21: Admitted to the ICU with severe sepsis with shock secondary to cellulitis of LLE  Consults:  PCCM  Procedures:  NONE  Significant Diagnostic Tests:  1/21: Chest Xray> no active cardiopulmonary process 1/21: CT left lower leg>Diffuse  subcutaneous edema may represent cellulitis. No drainable fluid collection/abscess 1/21: Ultrasound lower unilateral left> no DVT  Micro Data:  1/21: SARS-CoV-2 PCR> negative 1/21: Influenza PCR> negative 1/21: Blood culture x2> 1/21: Urine Culture> 1/21: MRSA PCR>>   Antimicrobials:  Vancomycin 1/21> Cefepime 1/21> Ceftriaxone 1/21 X1  OBJECTIVE  Blood pressure (!) 103/55, pulse (!) 101, temperature 99.9 F (37.7 C), temperature source Oral, resp. rate (!) 26, height 5\' 5"  (1.651 m), weight 120.7 kg, SpO2 96 %.        Intake/Output Summary (Last 24 hours) at 05/04/2021 2349 Last data filed at 05/04/2021 2304 Gross per 24 hour  Intake 2025.74 ml  Output --  Net 2025.74 ml   Filed Weights   05/04/21 1941  Weight: 120.7 kg     Physical Examination  GENERAL: 71 year-old critically ill patient lying in the bed with no acute distress.  EYES: Pupils equal, round, reactive to light and accommodation. No scleral icterus. Extraocular muscles intact.  HEENT: Head atraumatic, normocephalic. Oropharynx and nasopharynx clear.  NECK:  Supple, no jugular venous distention. No thyroid enlargement, no tenderness.  LUNGS: Normal breath sounds bilaterally, no wheezing, rales,rhonchi or crepitation. No use of accessory muscles of respiration.  CARDIOVASCULAR: S1, S2 normal. No murmurs, rubs, or gallops.  ABDOMEN: Soft, nontender, nondistended. Bowel sounds present. No organomegaly or mass.  EXTREMITIES: Left lower extremity edema, erythema, NO cyanosis, or clubbing.  NEUROLOGIC: Cranial nerves II through XII are intact.  Muscle strength in LOWER extremities decreased. Sensation intact. Gait not checked.  PSYCHIATRIC: The patient is alert and oriented x 3.  SKIN: No obvious rash, lesion, or ulcer.    Labs/imaging that I  havepersonally reviewed  (right click and "Reselect all SmartList Selections" daily)     Labs   CBC: Recent Labs  Lab 05/04/21 1943  WBC 16.5*  NEUTROABS 15.5*   HGB 12.5  HCT 40.8  MCV 78.5*  PLT 161    Basic Metabolic Panel: Recent Labs  Lab 05/04/21 1943  NA 141  K 2.9*  CL 99  CO2 26  GLUCOSE 144*  BUN 44*  CREATININE 3.47*  CALCIUM 8.7*   GFR: Estimated Creatinine Clearance: 19.1 mL/min (A) (by C-G formula based on SCr of 3.47 mg/dL (H)). Recent Labs  Lab 05/04/21 1943 05/04/21 2150  WBC 16.5*  --   LATICACIDVEN 3.8* 6.0*    Liver Function Tests: Recent Labs  Lab 05/04/21 1943  AST 67*  ALT 48*  ALKPHOS 103  BILITOT 1.1  PROT 7.9  ALBUMIN 2.8*   No results for input(s): LIPASE, AMYLASE in the last 168 hours. No results for input(s): AMMONIA in the last 168 hours.  ABG No results found for: PHART, PCO2ART, PO2ART, HCO3, TCO2, ACIDBASEDEF, O2SAT   Coagulation Profile: No results for input(s): INR, PROTIME in the last 168 hours.  Cardiac Enzymes: No results for input(s): CKTOTAL, CKMB, CKMBINDEX, TROPONINI in the last 168 hours.  HbA1C: Hemoglobin A1C  Date/Time Value Ref Range Status  08/24/2020 03:50 PM 6.4 (A) 4.0 - 5.6 % Final  02/10/2020 01:24 PM 6.4 (A) 4.0 - 5.6 % Final   Hgb A1c MFr Bld  Date/Time Value Ref Range Status  02/11/2021 09:41 AM 6.3 (H) <5.7 % of total Hgb Final    Comment:    For someone without known diabetes, a hemoglobin  A1c value between 5.7% and 6.4% is consistent with prediabetes and should be confirmed with a  follow-up test. . For someone with known diabetes, a value <7% indicates that their diabetes is well controlled. A1c targets should be individualized based on duration of diabetes, age, comorbid conditions, and other considerations. . This assay result is consistent with an increased risk of diabetes. . Currently, no consensus exists regarding use of hemoglobin A1c for diagnosis of diabetes for children. [name]   07/14/2019 09:40 AM 6.3 (H) <5.7 % of total Hgb Final    Comment:    For someone without known diabetes, a hemoglobin  A1c value between 5.7% and  6.4% is consistent with prediabetes and should be confirmed with a  follow-up test. . For someone with known diabetes, a value <7% indicates that their diabetes is well controlled. A1c targets should be individualized based on duration of diabetes, age, comorbid conditions, and other considerations. . This assay result is consistent with an increased risk of diabetes. . Currently, no consensus exists regarding use of hemoglobin A1c for diagnosis of diabetes for children. .     CBG: No results for input(s): GLUCAP in the last 168 hours.  Review of Systems:   Review of Systems  Constitutional:  Positive for chills, fever and malaise/fatigue.  HENT:  Positive for congestion.   Eyes: Negative.   Respiratory: Negative.    Cardiovascular:  Positive for leg swelling.  Gastrointestinal: Negative.   Genitourinary: Negative.   Musculoskeletal: Negative.   Skin: Negative.   Neurological: Negative.   Endo/Heme/Allergies: Negative.   Psychiatric/Behavioral: Negative.     Past Medical History  She,  has a past medical history of Arthritis, Asthma, COPD (chronic obstructive pulmonary disease) ([place]), Diabetes mellitus without complication ([place]), Endometriosis, GERD (gastroesophageal reflux disease), Hypertension, Obesity, and Sleep apnea.   Surgical History  Past Surgical History:  Procedure Laterality Date   ABDOMINAL HYSTERECTOMY     COLONOSCOPY  10/29/2006   Dr Allen Norris   COLONOSCOPY WITH PROPOFOL N/A 11/05/2016   Procedure: COLONOSCOPY WITH PROPOFOL;  Surgeon: Robert Bellow, MD;  Location[facility] ENDOSCOPY;  Service: Endoscopy;  Laterality: N/A;   NASAL SINUS SURGERY  2002   Dr Carlis Abbott     Social History   reports that she quit smoking about 15 years ago. Her smoking use included cigarettes. She has a 80.00 pack-year smoking history. She quit smokeless tobacco use about 20 years ago.  Her smokeless tobacco use included snuff. She reports that she does not drink alcohol and  does not use drugs.   Family History   Her family history includes Alcohol abuse in her brother; Breast cancer (age of onset: 20) in her sister; Congestive Heart Failure in her mother; Coronary artery disease in her father; Heart disease in her brother; Varicose Veins in her brother.   Allergies No Known Allergies   Home Medications  Prior to Admission medications   Medication Sig Start Date End Date Taking? Authorizing Provider  albuterol (PROVENTIL) (2.5 MG/3ML) 0.083% nebulizer solution Take 3 mLs (2.5 mg total) by nebulization every 6 (six) hours as needed for wheezing or shortness of breath. 10/03/19   Steele Sizer, MD  aspirin 81 MG tablet Take 1 tablet (81 mg total) by mouth daily. 01/08/16   Steele Sizer, MD  atorvastatin (LIPITOR) 40 MG tablet Take 1 tablet (40 mg total) by mouth daily. 02/11/21   Steele Sizer, MD  azelastine (ASTELIN) 0.1 % nasal spray Place 2 sprays into both nostrils 2 (two) times daily as needed for rhinitis or allergies. 07/31/20   Delsa Grana, PA-C  baclofen (LIORESAL) 10 MG tablet Take 10 mg by mouth daily. 04/16/21   [provider]  calcium carbonate (OSCAL) 1500 (600 Ca) MG TABS tablet Take 600 mg of elemental calcium by mouth 2 (two) times daily with a meal.    [provider]  celecoxib (CELEBREX) 200 MG capsule Take 200 mg by mouth daily. 04/03/21   [provider]  Cetirizine HCl (ZYRTEC ALLERGY) 10 MG TBDP Take 10 mg by mouth at bedtime. For sinus symptoms and seasonal allergies 07/31/20   Delsa Grana, PA-C  docusate sodium (COLACE) 100 MG capsule Take 100 mg by mouth daily.    [provider]  fluticasone Asencion Islam) 50 MCG/ACT nasal spray  08/31/20   [provider]  Fluticasone-Umeclidin-Vilant (TRELEGY ELLIPTA) 100-62.5-25 MCG/INH AEPB Inhale 1 puff into the lungs daily. In place of Stiolto 02/10/20   Steele Sizer, MD  glucose blood (ACCU-CHEK AVIVA PLUS) test strip Use as instructed 12/08/17   Steele Sizer, MD  Lancets (ACCU-CHEK SOFT TOUCH) lancets Use as instructed 12/08/17   Steele Sizer, MD  loratadine (CLARITIN) 10 MG tablet Take 1 tablet (10 mg total) by mouth daily. 02/11/21   Steele Sizer, MD  meloxicam (MOBIC) 15 MG tablet Take 15 mg by mouth daily. 04/23/21   [provider]  metFORMIN (GLUCOPHAGE-XR) 750 MG 24 hr tablet Take 1 tablet (750 mg total) by mouth daily with breakfast. 02/11/21   Ancil Boozer, Drue Stager, MD  montelukast (SINGULAIR) 10 MG tablet Take 1 tablet (10 mg total) by mouth at bedtime. 02/11/21   Steele Sizer, MD  Multiple Vitamins-Minerals (MULTIVITAL PO) Take 1 tablet by mouth daily.    [provider]  omeprazole (PRILOSEC) 20 MG capsule Take 1 capsule (20 mg total) by mouth daily. 02/11/21  Steele Sizer, MD  Bondurant 30 MG/2ML SOSY  10/09/20   [provider]  triamterene-hydrochlorothiazide (DYAZIDE) 37.5-25 MG capsule TAKE 1 CAPSULE EVERY DAY 05/02/21   Steele Sizer, MD  vitamin B-12 (CYANOCOBALAMIN) 100 MCG tablet Take 100 mcg by mouth daily.    [provider]  Scheduled Meds:  acetaminophen  650 mg Oral Once   [START ON 05/05/2021] fentaNYL (SUBLIMAZE) injection  50 mcg Intravenous Once   heparin  5,000 Units Subcutaneous Q8H   vancomycin variable dose per unstable renal function (pharmacist dosing)   Does not apply See admin instructions   Continuous Infusions:  ceFEPime (MAXIPIME) IV     potassium chloride     sodium chloride     vancomycin     PRN Meds:.docusate sodium, oxyCODONE, polyethylene glycol  Active Hospital Problem list    Assessment & Plan:  Severe Sepsis with shock  Secondary to Cellulitis of left lower leg Lactic: 3.8>6.0, Baseline PCT: 35.76, CT: left lower leg showed diffuse subcutaneous edema concerning for cellulitis, US DVT negative Initial interventions/workup included: 2.8 L of NS/LR & Ceftriaxone/ Vancomycin -Supplemental oxygen as needed, to maintain SpO2 > 90% -F/u cultures,  trend lactic/ PCT -Monitor WBC/ fever curve -IV antibiotics with cefepime & vancomycin  -IVF hydration as needed -Pressors for MAP goal >65 -Strict I/O's  AKI likely hemodynamically mediated in the setting of sepsis Lactic Acidosis Hypokalemia -Monitor I&O's / urinary output -Follow BMP -Ensure adequate renal perfusion -Avoid nephrotoxic agents as able -Replace electrolytes as indicated  COPD without evidence of acute exacerbation PMHx: Asthma, OSA on CPAP -Supplemental O2 as needed to maintain O2 saturations 88 to 92% -Follow intermittent ABG and chest x-ray as needed -Duonebs scheduled and PRN   Diabetes mellitus -CBGs -Sliding scale insulin -Follow ICU hyper/hypoglycemia protocol -Hold home Metformin    HLD  + Goal LDL<100 -Atorvastatin 40mg  PO qhs  Best practice:  Diet:  Oral Pain/Anxiety/Delirium protocol (if indicated): No VAP protocol (if indicated): Not indicated DVT prophylaxis: Subcutaneous Heparin GI prophylaxis: PPI Glucose control:  SSI Yes Central venous access:  N/A Arterial line:  N/A Foley:  N/A Mobility:  bed rest  PT consulted: N/A Last date of multidisciplinary goals of care discussion [1/21] Code Status:  full code Disposition: ICU   = Goals of Care = Code Status Order: ICU  Primary Emergency ContactANALISIA, [place], Home Phone: [phone] Wishes to pursue full aggressive treatment and intervention options, including CPR and intubation, but goals of care will be addressed on going with family if that should become necessary.  Critical care time: 45 minutes     Rufina Falco, DNP, CCRN, FNP-C, AGACNP-BC Acute Care Nurse Practitioner  [facility] Pager: [phone] [place] [facility]  .

## 2021-05-04 NOTE — ED Notes (Signed)
Ultrasound at bedside

## 2021-05-04 NOTE — Sepsis Progress Note (Addendum)
Elink following for Sepsis Protocol Please see Dr. Ellender Hose Notes from 9:40pm, using IBW for fluid resuscitation for sepsis protocol

## 2021-05-05 DIAGNOSIS — R6520 Severe sepsis without septic shock: Secondary | ICD-10-CM

## 2021-05-05 DIAGNOSIS — E872 Acidosis, unspecified: Secondary | ICD-10-CM

## 2021-05-05 DIAGNOSIS — A419 Sepsis, unspecified organism: Secondary | ICD-10-CM

## 2021-05-05 LAB — LACTIC ACID, PLASMA
Lactic Acid, Venous: 6.3 mmol/L (ref 0.5–1.9)
Lactic Acid, Venous: 6.3 mmol/L (ref 0.5–1.9)
Lactic Acid, Venous: 4.2 mmol/L (ref 0.5–1.9)
Lactic Acid, Venous: 3.7 mmol/L (ref 0.5–1.9)
Lactic Acid, Venous: 4.1 mmol/L (ref 0.5–1.9)
Lactic Acid, Venous: 2.9 mmol/L (ref 0.5–1.9)

## 2021-05-05 LAB — BLOOD CULTURE ID PANEL (REFLEXED) - BCID2
A.calcoaceticus-baumannii: NOT DETECTED
Bacteroides fragilis: NOT DETECTED
Candida albicans: NOT DETECTED
Candida auris: NOT DETECTED
Candida glabrata: NOT DETECTED
Candida krusei: NOT DETECTED
Candida parapsilosis: NOT DETECTED
Candida tropicalis: NOT DETECTED
Cryptococcus neoformans/gattii: NOT DETECTED
Enterobacter cloacae complex: NOT DETECTED
Enterobacterales: NOT DETECTED
Enterococcus Faecium: NOT DETECTED
Enterococcus faecalis: NOT DETECTED
Escherichia coli: NOT DETECTED
Haemophilus influenzae: NOT DETECTED
Klebsiella aerogenes: NOT DETECTED
Klebsiella oxytoca: NOT DETECTED
Klebsiella pneumoniae: NOT DETECTED
Listeria monocytogenes: NOT DETECTED
Neisseria meningitidis: NOT DETECTED
Proteus species: NOT DETECTED
Pseudomonas aeruginosa: NOT DETECTED
Salmonella species: NOT DETECTED
Serratia marcescens: NOT DETECTED
Staphylococcus aureus (BCID): NOT DETECTED
Staphylococcus epidermidis: NOT DETECTED
Staphylococcus lugdunensis: NOT DETECTED
Staphylococcus species: NOT DETECTED
Stenotrophomonas maltophilia: NOT DETECTED
Streptococcus agalactiae: NOT DETECTED
Streptococcus pneumoniae: NOT DETECTED
Streptococcus pyogenes: DETECTED — AB
Streptococcus species: DETECTED — AB

## 2021-05-05 LAB — CBC
HCT: 35.7 % — ABNORMAL LOW (ref 36.0–46.0)
Hemoglobin: 10.8 g/dL — ABNORMAL LOW (ref 12.0–15.0)
MCH: 23.6 pg — ABNORMAL LOW (ref 26.0–34.0)
MCHC: 30.3 g/dL (ref 30.0–36.0)
MCV: 77.9 fL — ABNORMAL LOW (ref 80.0–100.0)
Platelets: 228 10*3/uL (ref 150–400)
RBC: 4.58 MIL/uL (ref 3.87–5.11)
RDW: 14.8 % (ref 11.5–15.5)
WBC: 17.6 10*3/uL — ABNORMAL HIGH (ref 4.0–10.5)
nRBC: 0.2 % (ref 0.0–0.2)

## 2021-05-05 LAB — APTT
aPTT: 33 seconds (ref 24–36)
aPTT: 36 seconds (ref 24–36)

## 2021-05-05 LAB — CORTISOL: Cortisol, Plasma: 84.1 ug/dL

## 2021-05-05 LAB — BASIC METABOLIC PANEL
Anion gap: 13 (ref 5–15)
Anion gap: 16 — ABNORMAL HIGH (ref 5–15)
BUN: 48 mg/dL — ABNORMAL HIGH (ref 8–23)
BUN: 54 mg/dL — ABNORMAL HIGH (ref 8–23)
CO2: 21 mmol/L — ABNORMAL LOW (ref 22–32)
CO2: 18 mmol/L — ABNORMAL LOW (ref 22–32)
Calcium: 7.8 mg/dL — ABNORMAL LOW (ref 8.9–10.3)
Calcium: 7.7 mg/dL — ABNORMAL LOW (ref 8.9–10.3)
Chloride: 106 mmol/L (ref 98–111)
Chloride: 105 mmol/L (ref 98–111)
Creatinine, Ser: 3.3 mg/dL — ABNORMAL HIGH (ref 0.44–1.00)
Creatinine, Ser: 3.43 mg/dL — ABNORMAL HIGH (ref 0.44–1.00)
GFR, Estimated: 14 mL/min — ABNORMAL LOW (ref >60)
GFR, Estimated: 14 mL/min — ABNORMAL LOW (ref >60)
Glucose, Bld: 114 mg/dL — ABNORMAL HIGH (ref 70–99)
Glucose, Bld: 166 mg/dL — ABNORMAL HIGH (ref 70–99)
Potassium: 3.3 mmol/L — ABNORMAL LOW (ref 3.5–5.1)
Potassium: 3.8 mmol/L (ref 3.5–5.1)
Sodium: 140 mmol/L (ref 135–145)
Sodium: 139 mmol/L (ref 135–145)

## 2021-05-05 LAB — CBG MONITORING, ED
Glucose-Capillary: 123 mg/dL — ABNORMAL HIGH (ref 70–99)
Glucose-Capillary: 102 mg/dL — ABNORMAL HIGH (ref 70–99)

## 2021-05-05 LAB — PROTIME-INR
INR: 1.3 — ABNORMAL HIGH (ref 0.8–1.2)
Prothrombin Time: 16.1 seconds — ABNORMAL HIGH (ref 11.4–15.2)

## 2021-05-05 LAB — D-DIMER, QUANTITATIVE: D-Dimer, Quant: 2.22 ug/mL-FEU — ABNORMAL HIGH (ref 0.00–0.50)

## 2021-05-05 LAB — MAGNESIUM
Magnesium: 1.5 mg/dL — ABNORMAL LOW (ref 1.7–2.4)
Magnesium: 2.2 mg/dL (ref 1.7–2.4)

## 2021-05-05 LAB — MRSA NEXT GEN BY PCR, NASAL: MRSA by PCR Next Gen: NOT DETECTED

## 2021-05-05 LAB — PHOSPHORUS
Phosphorus: 2.8 mg/dL (ref 2.5–4.6)
Phosphorus: 4.5 mg/dL (ref 2.5–4.6)

## 2021-05-05 LAB — GLUCOSE, CAPILLARY
Glucose-Capillary: 108 mg/dL — ABNORMAL HIGH (ref 70–99)
Glucose-Capillary: 145 mg/dL — ABNORMAL HIGH (ref 70–99)
Glucose-Capillary: 166 mg/dL — ABNORMAL HIGH (ref 70–99)

## 2021-05-05 LAB — PROCALCITONIN
Procalcitonin: 35.76 ng/mL
Procalcitonin: 40.92 ng/mL

## 2021-05-05 MED ORDER — SODIUM BICARBONATE 8.4 % IV SOLN
INTRAVENOUS | Status: AC
  Filled 2021-05-05: qty 150

## 2021-05-05 MED ORDER — ATORVASTATIN CALCIUM 20 MG PO TABS
40.0000 mg | ORAL_TABLET | Freq: Every day | ORAL | Status: DC
  Administered 2021-05-05 – 2021-05-10 (×5): 40 mg via ORAL
  Filled 2021-05-05 (×6): qty 2

## 2021-05-05 MED ORDER — ASPIRIN EC 81 MG PO TBEC
81.0000 mg | DELAYED_RELEASE_TABLET | Freq: Every day | ORAL | Status: DC
  Administered 2021-05-05 – 2021-05-10 (×5): 81 mg via ORAL
  Filled 2021-05-05 (×6): qty 1

## 2021-05-05 MED ORDER — INSULIN ASPART 100 UNIT/ML IJ SOLN: 0.0000 [IU] | Freq: Every day | INTRAMUSCULAR | Status: DC

## 2021-05-05 MED ORDER — CHLORHEXIDINE GLUCONATE CLOTH 2 % EX PADS
6.0000 | MEDICATED_PAD | Freq: Every day | CUTANEOUS | Status: DC
  Administered 2021-05-05 – 2021-06-13 (×35): 6 via TOPICAL

## 2021-05-05 MED ORDER — INSULIN ASPART 100 UNIT/ML IJ SOLN
0.0000 [IU] | Freq: Three times a day (TID) | INTRAMUSCULAR | Status: DC
  Administered 2021-05-05: 1 [IU] via SUBCUTANEOUS
  Administered 2021-05-06: 3 [IU] via SUBCUTANEOUS
  Administered 2021-05-06 – 2021-05-07 (×4): 2 [IU] via SUBCUTANEOUS
  Administered 2021-05-08: 13:00:00 1 [IU] via SUBCUTANEOUS
  Administered 2021-05-08: 08:00:00 2 [IU] via SUBCUTANEOUS
  Administered 2021-05-09 – 2021-05-10 (×2): 1 [IU] via SUBCUTANEOUS
  Filled 2021-05-05 (×12): qty 1

## 2021-05-05 MED ORDER — UMECLIDINIUM BROMIDE 62.5 MCG/ACT IN AEPB
1.0000 | INHALATION_SPRAY | Freq: Every day | RESPIRATORY_TRACT | Status: DC
  Administered 2021-05-05 – 2021-05-10 (×5): 1 via RESPIRATORY_TRACT
  Filled 2021-05-05: qty 7

## 2021-05-05 MED ORDER — PENICILLIN G POTASSIUM 20000000 UNITS IJ SOLR
4.0000 10*6.[IU] | Freq: Three times a day (TID) | INTRAVENOUS | Status: AC
  Administered 2021-05-06 – 2021-05-07 (×6): 4 10*6.[IU] via INTRAVENOUS
  Filled 2021-05-05 (×9): qty 4

## 2021-05-05 MED ORDER — NOREPINEPHRINE 4 MG/250ML-% IV SOLN: 2.0000 ug/min | INTRAVENOUS | Status: DC

## 2021-05-05 MED ORDER — POTASSIUM CHLORIDE CRYS ER 20 MEQ PO TBCR
40.0000 meq | EXTENDED_RELEASE_TABLET | Freq: Once | ORAL | Status: AC
  Administered 2021-05-05: 40 meq via ORAL
  Filled 2021-05-05: qty 2

## 2021-05-05 MED ORDER — CLINDAMYCIN PHOSPHATE 600 MG/50ML IV SOLN
600.0000 mg | Freq: Three times a day (TID) | INTRAVENOUS | Status: DC
  Administered 2021-05-05 – 2021-05-06 (×4): 600 mg via INTRAVENOUS
  Filled 2021-05-05 (×6): qty 50

## 2021-05-05 MED ORDER — LACTATED RINGERS IV BOLUS
500.0000 mL | Freq: Once | INTRAVENOUS | Status: AC
  Administered 2021-05-05: 500 mL via INTRAVENOUS

## 2021-05-05 MED ORDER — MONTELUKAST SODIUM 10 MG PO TABS
10.0000 mg | ORAL_TABLET | Freq: Every day | ORAL | Status: DC
  Administered 2021-05-05 – 2021-05-09 (×3): 10 mg via ORAL
  Filled 2021-05-05 (×3): qty 1

## 2021-05-05 MED ORDER — MAGNESIUM SULFATE 2 GM/50ML IV SOLN
2.0000 g | Freq: Once | INTRAVENOUS | Status: AC
Start: 2021-05-05 — End: 2021-05-05
  Administered 2021-05-05: 2 g via INTRAVENOUS
  Filled 2021-05-05: qty 50

## 2021-05-05 MED ORDER — MOMETASONE FURO-FORMOTEROL FUM 200-5 MCG/ACT IN AERO
2.0000 | INHALATION_SPRAY | Freq: Two times a day (BID) | RESPIRATORY_TRACT | Status: DC
  Administered 2021-05-05 – 2021-05-10 (×7): 2 via RESPIRATORY_TRACT
  Filled 2021-05-05: qty 8.8

## 2021-05-05 MED ORDER — SODIUM CHLORIDE 0.9 % IV SOLN
250.0000 mL | INTRAVENOUS | Status: DC
  Administered 2021-05-10 – 2021-05-28 (×9): 250 mL via INTRAVENOUS

## 2021-05-05 MED ORDER — PENICILLIN G POTASSIUM 20000000 UNITS IJ SOLR: 4.0000 10*6.[IU] | INTRAVENOUS | Status: DC

## 2021-05-05 MED ORDER — SODIUM CHLORIDE 0.9 % IV BOLUS
1000.0000 mL | Freq: Once | INTRAVENOUS | Status: AC
  Administered 2021-05-05: 1000 mL via INTRAVENOUS

## 2021-05-05 MED ORDER — PANTOPRAZOLE SODIUM 40 MG PO TBEC
40.0000 mg | DELAYED_RELEASE_TABLET | Freq: Every day | ORAL | Status: DC
  Administered 2021-05-05 – 2021-05-10 (×5): 40 mg via ORAL
  Filled 2021-05-05 (×5): qty 1

## 2021-05-05 NOTE — Consult Note (Signed)
PHARMACY - PHYSICIAN COMMUNICATION CRITICAL VALUE ALERT - BLOOD CULTURE IDENTIFICATION (BCID)  Alexandra Foster is an 71 y.o. female who presented to [facility] on 05/04/2021 with a chief complaint of cellulitis  Assessment: Good afternoon. Strep pyogenes growing in 2/4 blood cultures (aerobe and anaerobe). No resistance detected. Recommend Penicillin G 4 million units IV q8h and clindamycin 600 mg IV q8h  Name of physician (or Provider) Contacted: Dr. Jonnie Finner  Current antibiotics: Cefepime 2g IV every 24 hours  Changes to prescribed antibiotics recommended:  Recommendations accepted by provider  Results for orders placed or performed during the hospital encounter of 05/04/21  Blood Culture ID Panel (Reflexed) (Collected: 05/04/2021  7:43 PM)  Result Value Ref Range   Enterococcus faecalis NOT DETECTED NOT DETECTED   Enterococcus Faecium NOT DETECTED NOT DETECTED   Listeria monocytogenes NOT DETECTED NOT DETECTED   Staphylococcus species NOT DETECTED NOT DETECTED   Staphylococcus aureus (BCID) NOT DETECTED NOT DETECTED   Staphylococcus epidermidis NOT DETECTED NOT DETECTED   Staphylococcus lugdunensis NOT DETECTED NOT DETECTED   Streptococcus species DETECTED (A) NOT DETECTED   Streptococcus agalactiae NOT DETECTED NOT DETECTED   Streptococcus pneumoniae NOT DETECTED NOT DETECTED   Streptococcus pyogenes DETECTED (A) NOT DETECTED   A.calcoaceticus-baumannii NOT DETECTED NOT DETECTED   Bacteroides fragilis NOT DETECTED NOT DETECTED   Enterobacterales NOT DETECTED NOT DETECTED   Enterobacter cloacae complex NOT DETECTED NOT DETECTED   Escherichia coli NOT DETECTED NOT DETECTED   Klebsiella aerogenes NOT DETECTED NOT DETECTED   Klebsiella oxytoca NOT DETECTED NOT DETECTED   Klebsiella pneumoniae NOT DETECTED NOT DETECTED   Proteus species NOT DETECTED NOT DETECTED   Salmonella species NOT DETECTED NOT DETECTED   Serratia marcescens NOT DETECTED NOT DETECTED   Haemophilus  influenzae NOT DETECTED NOT DETECTED   Neisseria meningitidis NOT DETECTED NOT DETECTED   Pseudomonas aeruginosa NOT DETECTED NOT DETECTED   Stenotrophomonas maltophilia NOT DETECTED NOT DETECTED   Candida albicans NOT DETECTED NOT DETECTED   Candida auris NOT DETECTED NOT DETECTED   Candida glabrata NOT DETECTED NOT DETECTED   Candida krusei NOT DETECTED NOT DETECTED   Candida parapsilosis NOT DETECTED NOT DETECTED   Candida tropicalis NOT DETECTED NOT DETECTED   Cryptococcus neoformans/gattii NOT DETECTED NOT DETECTED    Darrick Penna 05/05/2021  10:25 AM

## 2021-05-05 NOTE — Progress Notes (Signed)
Patient has been placed on cpap of 10 cm h2o. No auto titration available for use at this time.

## 2021-05-05 NOTE — ED Notes (Signed)
Pt resting comfortably in bed. Complaining of sore throat at this time.  Pt denies complaints at this time.

## 2021-05-05 NOTE — Progress Notes (Signed)
Pharmacy Antibiotic Note  Alexandra Foster is a 71 y.o. female admitted on 05/04/2021 with sepsis and cellulitis.  Pharmacy has been consulted for Vanc, Cefepime dosing.  Pt appears to have AKI:  baseline SrCr is ~ 1.0 but SrCr on 1/21 was 3.47.   Plan: Cefepime 2 gm IV Q24H ordered to start on 1/22 @ ~ 0000.  Vancomycin 1 gm IV X 1 given in ED on 1/21 @ 2143. Additional Vanc 1500 mg ordered to make total loading dose of 2500 mg. - since this pt is AKI, will dose by levels until renal function stabilizes. - will draw random vanc on 1/22 @ 2200.   Height: 5\' 5"  (165.1 cm) Weight: 120.7 kg (266 lb) IBW/kg (Calculated) : 57  Temp (24hrs), Avg:99.9 F (37.7 C), Min:99.9 F (37.7 C), Max:99.9 F (37.7 C)  Recent Labs  Lab 05/04/21 1943 05/04/21 2150 05/05/21 0027  WBC 16.5*  --   --   CREATININE 3.47*  --   --   LATICACIDVEN 3.8* 6.0* 6.3*   6.3*    Estimated Creatinine Clearance: 19.1 mL/min (A) (by C-G formula based on SCr of 3.47 mg/dL (H)).    No Known Allergies  Antimicrobials this admission:  >>    >>   Dose adjustments this admission:   Microbiology results:  BCx:   UCx:    Sputum:    MRSA PCR:   Thank you for allowing pharmacy to be a part of this patients care.  Hailley Byers D 05/05/2021 1:33 AM

## 2021-05-05 NOTE — Progress Notes (Signed)
Spoke with Alexandra Foster states pt is not on pressors at this time.  Pt currenly has adequate access for current medications.

## 2021-05-05 NOTE — ED Notes (Signed)
Alexandra Foster (family) leaves bedside. Call with updates and when pt gets up to floor. [phone])

## 2021-05-05 NOTE — Consult Note (Signed)
PHARMACY CONSULT NOTE - FOLLOW UP  Pharmacy Consult for Electrolyte Monitoring and Replacement   Recent Labs: Potassium (mmol/L)  Date Value  05/05/2021 3.3 (L)  08/11/2013 3.3 (L)   Magnesium (mg/dL)  Date Value  05/05/2021 2.2   Calcium (mg/dL)  Date Value  05/05/2021 7.8 (L)   Calcium, Total (mg/dL)  Date Value  08/11/2013 9.2   Albumin (g/dL)  Date Value  05/04/2021 2.8 (L)  08/11/2013 3.1 (L)   Phosphorus (mg/dL)  Date Value  05/05/2021 4.5   Sodium (mmol/L)  Date Value  05/05/2021 140  08/11/2013 138     Assessment: Pharmacy has been consulted to monitor and replace electrolytes in 70yo patient with severe sepsis and strep pyogenes bacteremia.   Goal of Therapy:  Electrolytes WNL  Plan:  K 3.3 --KCL 37mEq PO x 1 dose ordered --will f/u electrolytes with AM labs  Pearla Dubonnet ,PharmD Clinical Pharmacist 05/05/2021 1:26 PM

## 2021-05-05 NOTE — ED Notes (Signed)
Pt c/o of pain rated 7/10 in left leg. Medication will be given

## 2021-05-05 NOTE — ED Notes (Signed)
Pt given some ice chips and bubbler added to oxygen to improve comfort and throat pain

## 2021-05-05 NOTE — ED Notes (Signed)
Pt placed on 2 liters nasal cannula due to pts oxygen dropping to 87% ans pt stating she uses cpap at night.

## 2021-05-06 ENCOUNTER — Encounter: Payer: Self-pay | Admitting: Internal Medicine

## 2021-05-06 ENCOUNTER — Inpatient Hospital Stay (HOSPITAL_COMMUNITY)
Admit: 2021-05-06 | Discharge: 2021-05-06 | Disposition: A | Discharge status: Home / Self Care | Payer: Medicare HMO | Attending: Internal Medicine | Admitting: Internal Medicine

## 2021-05-06 DIAGNOSIS — I4891 Unspecified atrial fibrillation: Secondary | ICD-10-CM

## 2021-05-06 DIAGNOSIS — A400 Sepsis due to streptococcus, group A: Principal | ICD-10-CM

## 2021-05-06 DIAGNOSIS — R7881 Bacteremia: Secondary | ICD-10-CM

## 2021-05-06 DIAGNOSIS — L03116 Cellulitis of left lower limb: Secondary | ICD-10-CM

## 2021-05-06 DIAGNOSIS — A419 Sepsis, unspecified organism: Secondary | ICD-10-CM | POA: Diagnosis not present

## 2021-05-06 LAB — CBC
HCT: 33.4 % — ABNORMAL LOW (ref 36.0–46.0)
Hemoglobin: 10.5 g/dL — ABNORMAL LOW (ref 12.0–15.0)
MCH: 23.9 pg — ABNORMAL LOW (ref 26.0–34.0)
MCHC: 31.4 g/dL (ref 30.0–36.0)
MCV: 75.9 fL — ABNORMAL LOW (ref 80.0–100.0)
Platelets: 240 10*3/uL (ref 150–400)
RBC: 4.4 MIL/uL (ref 3.87–5.11)
RDW: 14.8 % (ref 11.5–15.5)
WBC: 16.2 10*3/uL — ABNORMAL HIGH (ref 4.0–10.5)
nRBC: 0.6 % — ABNORMAL HIGH (ref 0.0–0.2)

## 2021-05-06 LAB — ECHOCARDIOGRAM COMPLETE BUBBLE STUDY
AR max vel: 2.4 cm2
AV Area VTI: 2.75 cm2
AV Area mean vel: 2.31 cm2
AV Mean grad: 5 mmHg
AV Peak grad: 8.9 mmHg
Ao pk vel: 1.49 m/s
Area-P 1/2: 6.07 cm2
S' Lateral: 2.1 cm

## 2021-05-06 LAB — GLUCOSE, CAPILLARY
Glucose-Capillary: 188 mg/dL — ABNORMAL HIGH (ref 70–99)
Glucose-Capillary: 203 mg/dL — ABNORMAL HIGH (ref 70–99)
Glucose-Capillary: 153 mg/dL — ABNORMAL HIGH (ref 70–99)
Glucose-Capillary: 145 mg/dL — ABNORMAL HIGH (ref 70–99)

## 2021-05-06 LAB — HEMOGLOBIN A1C
Hgb A1c MFr Bld: 6.7 % — ABNORMAL HIGH (ref 4.8–5.6)
Mean Plasma Glucose: 146 mg/dL

## 2021-05-06 LAB — PHOSPHORUS: Phosphorus: 4.4 mg/dL (ref 2.5–4.6)

## 2021-05-06 LAB — BASIC METABOLIC PANEL
Anion gap: 11 (ref 5–15)
BUN: 60 mg/dL — ABNORMAL HIGH (ref 8–23)
CO2: 23 mmol/L (ref 22–32)
Calcium: 7.4 mg/dL — ABNORMAL LOW (ref 8.9–10.3)
Chloride: 104 mmol/L (ref 98–111)
Creatinine, Ser: 3.46 mg/dL — ABNORMAL HIGH (ref 0.44–1.00)
GFR, Estimated: 13 mL/min — ABNORMAL LOW (ref >60)
Glucose, Bld: 189 mg/dL — ABNORMAL HIGH (ref 70–99)
Potassium: 4.2 mmol/L (ref 3.5–5.1)
Sodium: 138 mmol/L (ref 135–145)

## 2021-05-06 LAB — MAGNESIUM: Magnesium: 2.4 mg/dL (ref 1.7–2.4)

## 2021-05-06 LAB — PROCALCITONIN: Procalcitonin: 66.16 ng/mL

## 2021-05-06 MED ORDER — HEPARIN BOLUS VIA INFUSION
4300.0000 [IU] | Freq: Once | INTRAVENOUS | Status: AC
  Administered 2021-05-06: 4300 [IU] via INTRAVENOUS
  Filled 2021-05-06: qty 4300

## 2021-05-06 MED ORDER — MIDODRINE HCL 5 MG PO TABS
10.0000 mg | ORAL_TABLET | Freq: Three times a day (TID) | ORAL | Status: DC
  Administered 2021-05-06 – 2021-05-10 (×8): 10 mg via ORAL
  Filled 2021-05-06 (×9): qty 2

## 2021-05-06 MED ORDER — HEPARIN (PORCINE) 25000 UT/250ML-% IV SOLN
1200.0000 [IU]/h | INTRAVENOUS | Status: DC
  Administered 2021-05-06 – 2021-05-07 (×2): 1200 [IU]/h via INTRAVENOUS
  Filled 2021-05-06 (×2): qty 250

## 2021-05-06 MED ORDER — AMIODARONE LOAD VIA INFUSION
150.0000 mg | Freq: Once | INTRAVENOUS | Status: DC
  Administered 2021-05-06: 150 mg via INTRAVENOUS
  Filled 2021-05-06: qty 83.34

## 2021-05-06 MED ORDER — AMIODARONE HCL IN DEXTROSE 360-4.14 MG/200ML-% IV SOLN
60.0000 mg/h | INTRAVENOUS | Status: DC
  Administered 2021-05-06: 60 mg/h via INTRAVENOUS
  Filled 2021-05-06: qty 200

## 2021-05-06 MED ORDER — MIDODRINE HCL 5 MG PO TABS
10.0000 mg | ORAL_TABLET | Freq: Three times a day (TID) | ORAL | Status: DC
Start: 2021-05-07 — End: 2021-05-06

## 2021-05-06 MED ORDER — CLINDAMYCIN PHOSPHATE 900 MG/50ML IV SOLN
900.0000 mg | Freq: Three times a day (TID) | INTRAVENOUS | Status: DC
  Administered 2021-05-06 – 2021-05-08 (×4): 900 mg via INTRAVENOUS
  Filled 2021-05-06 (×7): qty 50

## 2021-05-06 MED ORDER — AMIODARONE HCL IN DEXTROSE 360-4.14 MG/200ML-% IV SOLN
30.0000 mg/h | INTRAVENOUS | Status: DC
  Administered 2021-05-06 – 2021-05-10 (×9): 30 mg/h via INTRAVENOUS
  Filled 2021-05-06 (×9): qty 200

## 2021-05-06 MED ORDER — AMIODARONE IV BOLUS ONLY 150 MG/100ML
INTRAVENOUS | Status: AC
  Administered 2021-05-06: 150 mg
  Filled 2021-05-06: qty 100

## 2021-05-06 NOTE — Consult Note (Signed)
NAME: Alexandra Foster  DOB: 11-Nov-1950  MRN: [id number]  Date/Time: 05/06/2021 2:56 PM  REQUESTING PROVIDER: Dr.Kasa Subjective:  REASON FOR CONSULT: strep pyogenes bacteremia ?History from chart and family at bed side Alexandra Foster is a 71 y.o. female with a history of DM, OSA, COPD, HTN She presented to the ED on 04/15/21 with left leg pain. It had started in the ankle area and progressed upwards making walking very painful In the ED vitals temp of 99.9, BP 101/55, HR 96 . WBC 16.5, cr 3.47, K 2.9, AST 67, ALT 48, lactate 6 Had a sore throat on 04/30/21 with nasal congestion and had a virtual visit with her PCP on 04/30/21 and was diagnosed with viral URI. CT leg showed diffuse subcutaneous edema .Blood cultures sent and she was started on vanco/cefepime which was later changed to PCN and clindamycin as blood culture came back as Group A strep Pt is in the ICU with toxic shock syndrome in AKI Past Medical History:  Diagnosis Date   AKI (acute kidney injury) ([place])    a. 04/2021 in setting of bacteremia/shock.   Arthritis    Asthma    Bacteremia    a. 04/2021 S pyogenes bacteremia in setting of lower ext cellulitis.   COPD (chronic obstructive pulmonary disease) (HCC)    Diabetes mellitus without complication (HCC)    Endometriosis    GERD (gastroesophageal reflux disease)    History of echocardiogram    a. 07/2013 Echo: EF 55-60%, impaired relaxation, mild TR; b. 04/2021 Echo: EF 50-55%, mild LVH, nl RV fxn, mild BAE, Ao sclerosis w/o stenosis.   Hypertension    Obesity    PAF (paroxysmal atrial fibrillation) ([place])    a. 04/2021 in setting of septic shock/cellulitis.   Sleep apnea    CPAP    Past Surgical History:  Procedure Laterality Date   ABDOMINAL HYSTERECTOMY     COLONOSCOPY  10/29/2006   Dr Allen Norris   COLONOSCOPY WITH PROPOFOL N/A 11/05/2016   Procedure: COLONOSCOPY WITH PROPOFOL;  Surgeon: Robert Bellow, MD;  Location: [facility] ENDOSCOPY;  Service: Endoscopy;  Laterality:  N/A;   NASAL SINUS SURGERY  2002   Dr Carlis Abbott    Social History   Socioeconomic History   Marital status: Married    Spouse name: Trilby Drummer   Number of children: 1   Years of education: 12   Highest education level: 12th grade  Occupational History    Employer: RETIRED  Tobacco Use   Smoking status: Former    Packs/day: 2.00    Years: 40.00    Pack years: 80.00    Types: Cigarettes    Quit date: 2008    Years since quitting: 15.0   Smokeless tobacco: Former    Types: Snuff    Quit date: 04/2001   Tobacco comments:    smoking cessation materials not required  Vaping Use   Vaping Use: Never used  Substance and Sexual Activity   Alcohol use: No    Alcohol/week: 0.0 standard drinks   Drug use: No   Sexual activity: Not Currently  Other Topics Concern   Not on file  Social History Narrative   Not on file   Social Determinants of Health   Financial Resource Strain: Medium Risk   Difficulty of Paying Living Expenses: Somewhat hard  Food Insecurity: No Food Insecurity   Worried About Charity fundraiser in the Last Year: Never true   Ran Out of Food in the Last Year: Never  true  Transportation Needs: No Transportation Needs   Lack of Transportation (Medical): No   Lack of Transportation (Non-Medical): No  Physical Activity: Inactive   Days of Exercise per Week: 0 days   Minutes of Exercise per Session: 0 min  Stress: No Stress Concern Present   Feeling of Stress : Not at all  Social Connections: Moderately Integrated   Frequency of Communication with Friends and Family: More than three times a week   Frequency of Social Gatherings with Friends and Family: More than three times a week   Attends Religious Services: More than 4 times per year   Active Member of [employer] or Organizations: No   Attends Music therapist: Never   Marital Status: Married  Human resources officer Violence: Not At Risk   Fear of Current or Ex-Partner: No   Emotionally Abused: No    Physically Abused: No   Sexually Abused: No    Family History  Problem Relation Age of Onset   Congestive Heart Failure Mother    Coronary artery disease Father    Breast cancer Sister 58   Heart disease Brother    Varicose Veins Brother    Alcohol abuse Brother    No Known Allergies I? Current Facility-Administered Medications  Medication Dose Route Frequency Provider Last Rate Last Admin   0.9 %  sodium chloride infusion  250 mL Intravenous Continuous Bennie Pierini, MD   Held at 05/05/21 [phone]   amiodarone (NEXTERONE PREMIX) 360-4.14 MG/200ML-% (1.8 mg/mL) IV infusion  30 mg/hr Intravenous Continuous Flora Lipps, MD 16.67 mL/hr at 05/06/21 1350 30 mg/hr at 05/06/21 1350   aspirin EC tablet 81 mg  81 mg Oral Daily Bennie Pierini, MD   81 mg at 05/06/21 0910   atorvastatin (LIPITOR) tablet 40 mg  40 mg Oral Daily Bennie Pierini, MD   40 mg at 05/06/21 0910   Chlorhexidine Gluconate Cloth 2 % PADS 6 each  6 each Topical Q0600 Bennie Pierini, MD   6 each at 05/06/21 [phone]   clindamycin (CLEOCIN) IVPB 600 mg  600 mg Intravenous Q8H Bennie Pierini, MD 100 mL/hr at 05/06/21 1414 600 mg at 05/06/21 1414   docusate sodium (COLACE) capsule 100 mg  100 mg Oral BID PRN Lang Snow, NP       heparin injection 5,000 Units  5,000 Units Subcutaneous Q8H Lang Snow, NP   5,000 Units at 05/06/21 1340   insulin aspart (novoLOG) injection 0-5 Units  0-5 Units Subcutaneous QHS Lang Snow, NP       insulin aspart (novoLOG) injection 0-9 Units  0-9 Units Subcutaneous TID WC Lang Snow, NP   3 Units at 05/06/21 1238   mometasone-formoterol (DULERA) 200-5 MCG/ACT inhaler 2 puff  2 puff Inhalation BID Bennie Pierini, MD   2 puff at 05/06/21 0743   montelukast (SINGULAIR) tablet 10 mg  10 mg Oral QHS Bennie Pierini, MD   10 mg at 05/05/21 2150   oxyCODONE (Oxy IR/ROXICODONE) immediate release tablet 10 mg  10 mg Oral Q6H PRN Lang Snow, NP   10 mg at 05/06/21 1414   pantoprazole (PROTONIX) EC tablet 40 mg  40 mg Oral Daily Lang Snow, NP   40 mg at 05/06/21 0910   penicillin G potassium 4 Million Units in dextrose 5 % 250 mL IVPB  4 Million Units Intravenous Q8H Bennie Pierini, MD 250 mL/hr at 05/06/21 0910 4 Million Units at  05/06/21 0910   polyethylene glycol (MIRALAX / GLYCOLAX) packet 17 g  17 g Oral Daily PRN Lang Snow, NP       umeclidinium bromide (INCRUSE ELLIPTA) 62.5 MCG/ACT 1 puff  1 puff Inhalation Daily Bennie Pierini, MD   1 puff at 05/06/21 0744     Abtx:  Anti-infectives (From admission, onward)    Start     Dose/Rate Route Frequency Ordered Stop   05/06/21 0030  penicillin G potassium 4 Million Units in dextrose 5 % 250 mL IVPB        4 Million Units 250 mL/hr over 60 Minutes Intravenous Every 8 hours 05/05/21 1023     05/05/21 1400  clindamycin (CLEOCIN) IVPB 600 mg        600 mg 100 mL/hr over 30 Minutes Intravenous Every 8 hours 05/05/21 1010     05/05/21 1200  penicillin G potassium 4 Million Units in dextrose 5 % 250 mL IVPB  Status:  Discontinued        4 Million Units 250 mL/hr over 60 Minutes Intravenous Every 4 hours 05/05/21 1018 05/05/21 1024   05/04/21 2345  ceFEPIme (MAXIPIME) 2 g in sodium chloride 0.9 % 100 mL IVPB  Status:  Discontinued        2 g 200 mL/hr over 30 Minutes Intravenous Every 24 hours 05/04/21 2328 05/05/21 1012   05/04/21 2345  vancomycin (VANCOREADY) IVPB 1500 mg/300 mL        1,500 mg 150 mL/hr over 120 Minutes Intravenous  Once 05/04/21 2337 05/05/21 0349   05/04/21 2338  vancomycin variable dose per unstable renal function (pharmacist dosing)  Status:  Discontinued         Does not apply See admin instructions 05/04/21 2338 05/05/21 1030   05/04/21 2330  vancomycin (VANCOCIN) IVPB 1000 mg/200 mL premix  Status:  Discontinued        1,000 mg 200 mL/hr over 60 Minutes Intravenous  Once 05/04/21 2328 05/04/21 2336    05/04/21 2045  vancomycin (VANCOCIN) IVPB 1000 mg/200 mL premix        1,000 mg 200 mL/hr over 60 Minutes Intravenous  Once 05/04/21 2040 05/04/21 2243   05/04/21 2045  cefTRIAXone (ROCEPHIN) 2 g in sodium chloride 0.9 % 100 mL IVPB        2 g 200 mL/hr over 30 Minutes Intravenous  Once 05/04/21 2040 05/04/21 2206       REVIEW OF SYSTEMS:  Not reliable - pt is confused C/o pain left leg Itching abdomen Objective:  VITALS:  BP (!) 84/63    Pulse 83    Temp 99.5 F (37.5 C) (Oral)    Resp (!) 25    Ht 5\' 5"  (1.651 m)    Wt 123.4 kg    SpO2 96%    BMI 45.27 kg/m  PHYSICAL EXAM:  General: Awake but confused- does respond to questions inconsistently Head: Normocephalic, without obvious abnormality, atraumatic. Eyes: Conjunctivae clear, anicteric sclerae. Pupils are equal ENT could not examine her throat properly Neck: Supple, symmetrical, no adenopathy, thyroid: non tender no carotid bruit and no JVD. Back: did not examine Lungs: b/l air entry Heart: irregular RVR Abdomen: Soft, non-tender,not distended. Bowel sounds normal. No masses Extremities: left leg swollen- erythematous- superficial blisters   Skin: no rash over abdomen or extremities Lymph: Cervical, supraclavicular normal. Neurologic: cannot assess Pertinent Labs Lab Results CBC    Component Value Date/Time   WBC 16.2 (H) 05/06/2021 0325   RBC 4.40  05/06/2021 0325   HGB 10.5 (L) 05/06/2021 0325   HGB 13.5 08/11/2013 0933   HCT 33.4 (L) 05/06/2021 0325   HCT 42.6 08/11/2013 0933   PLT 240 05/06/2021 0325   PLT 300 08/11/2013 0933   MCV 75.9 (L) 05/06/2021 0325   MCV 79 (L) 08/11/2013 0933   MCH 23.9 (L) 05/06/2021 0325   MCHC 31.4 05/06/2021 0325   RDW 14.8 05/06/2021 0325   RDW 14.1 08/11/2013 0933   LYMPHSABS 0.3 (L) 05/04/2021 1943   LYMPHSABS 3.2 08/11/2013 0933   MONOABS 0.4 05/04/2021 1943   MONOABS 0.6 08/11/2013 0933   EOSABS 0.1 05/04/2021 1943   EOSABS 1.2 (H) 08/11/2013 0933   BASOSABS 0.1  05/04/2021 1943   BASOSABS 0.1 08/11/2013 0933    CMP Latest Ref Rng & Units 05/06/2021 05/05/2021 05/05/2021  Glucose 70 - 99 mg/dL 189(H) 166(H) 114(H)  BUN 8 - 23 mg/dL 60(H) 54(H) 48(H)  Creatinine 0.44 - 1.00 mg/dL 3.46(H) 3.43(H) 3.30(H)  Sodium 135 - 145 mmol/L 138 139 140  Potassium 3.5 - 5.1 mmol/L 4.2 3.8 3.3(L)  Chloride 98 - 111 mmol/L 104 105 106  CO2 22 - 32 mmol/L 23 18(L) 21(L)  Calcium 8.9 - 10.3 mg/dL 7.4(L) 7.7(L) 7.8(L)  Total Protein 6.5 - 8.1 g/dL - - -  Total Bilirubin 0.3 - 1.2 mg/dL - - -  Alkaline Phos 38 - 126 U/L - - -  AST 15 - 41 U/L - - -  ALT 0 - 44 U/L - - -      Microbiology: Recent Results (from the past 240 hour(s))  Novel Coronavirus, NAA ([facility])     Status: None   Collection Time: 04/30/21 12:00 AM   Specimen: Nasopharyngeal(NP) swabs in vial transport medium   Nasopharynge  Previous  Result Value Ref Range Status   SARS-CoV-2, NAA Not Detected Not Detected Final    Comment: This nucleic acid amplification test was developed and its performance characteristics determined by [employer]. Nucleic acid amplification tests include RT-PCR and TMA. This test has not been FDA cleared or approved. This test has been authorized by FDA under an Emergency Use Authorization (EUA). This test is only authorized for the duration of time the declaration that circumstances exist justifying the authorization of the emergency use of in vitro diagnostic tests for detection of SARS-CoV-2 virus and/or diagnosis of COVID-19 infection under section 564(b)(1) of the Act, 21 U.S.C. 353IRW-4(R) (1), unless the authorization is terminated or revoked sooner. When diagnostic testing is negative, the possibility of a false negative result should be considered in the context of a patient's recent exposures and the presence of clinical signs and symptoms consistent with COVID-19. An individual without symptoms of COVID-19 and who is not shedding SARS-CoV-2  virus wo uld expect to have a negative (not detected) result in this assay.   SARS-COV-2, NAA 2 DAY TAT     Status: None   Collection Time: 04/30/21 12:00 AM   Nasopharynge  Previous  Result Value Ref Range Status   SARS-CoV-2, NAA 2 DAY TAT Performed  Final  Blood Culture (routine x 2)     Status: Abnormal (Preliminary result)   Collection Time: 05/04/21  7:43 PM   Specimen: BLOOD  Result Value Ref Range Status   Specimen Description   Final    BLOOD RIGHT ANTECUBITAL Performed at [facility], [place] [address] [place], [place] [address]    Special Requests   Final    BOTTLES DRAWN AEROBIC  AND ANAEROBIC Blood Culture adequate volume Performed at [facility], [address] [place], [place] [address]    Culture  Setup Time   Final    GRAM POSITIVE COCCI IN BOTH AEROBIC AND ANAEROBIC BOTTLES RESULT CALLED TO, READ BACK BY AND VERIFIED WITH: Nilsa Nutting 05/05/21 @ 1007 BY SB    Culture (A)  Final    GROUP A STREP (S.PYOGENES) ISOLATED SUSCEPTIBILITIES TO FOLLOW HEALTH DEPARTMENT NOTIFIED Performed at [facility], [place] [address], [place], [place] [address]    Report Status PENDING  Incomplete  Blood Culture ID Panel (Reflexed)     Status: Abnormal   Collection Time: 05/04/21  7:43 PM  Result Value Ref Range Status   Enterococcus faecalis NOT DETECTED NOT DETECTED Final   Enterococcus Faecium NOT DETECTED NOT DETECTED Final   Listeria monocytogenes NOT DETECTED NOT DETECTED Final   Staphylococcus species NOT DETECTED NOT DETECTED Final   Staphylococcus aureus (BCID) NOT DETECTED NOT DETECTED Final   Staphylococcus epidermidis NOT DETECTED NOT DETECTED Final   Staphylococcus lugdunensis NOT DETECTED NOT DETECTED Final   Streptococcus species DETECTED (A) NOT DETECTED Final    Comment: RESULT CALLED TO, READ BACK BY AND VERIFIED WITH: Nilsa Nutting 05/05/21 @ 1007 BY SB    Streptococcus agalactiae NOT DETECTED NOT DETECTED Final   Streptococcus  pneumoniae NOT DETECTED NOT DETECTED Final   Streptococcus pyogenes DETECTED (A) NOT DETECTED Final    Comment: RESULT CALLED TO, READ BACK BY AND VERIFIED WITH: Nilsa Nutting 05/05/21 @ 1007 BY SB    A.calcoaceticus-baumannii NOT DETECTED NOT DETECTED Final   Bacteroides fragilis NOT DETECTED NOT DETECTED Final   Enterobacterales NOT DETECTED NOT DETECTED Final   Enterobacter cloacae complex NOT DETECTED NOT DETECTED Final   Escherichia coli NOT DETECTED NOT DETECTED Final   Klebsiella aerogenes NOT DETECTED NOT DETECTED Final   Klebsiella oxytoca NOT DETECTED NOT DETECTED Final   Klebsiella pneumoniae NOT DETECTED NOT DETECTED Final   Proteus species NOT DETECTED NOT DETECTED Final   Salmonella species NOT DETECTED NOT DETECTED Final   Serratia marcescens NOT DETECTED NOT DETECTED Final   Haemophilus influenzae NOT DETECTED NOT DETECTED Final   Neisseria meningitidis NOT DETECTED NOT DETECTED Final   Pseudomonas aeruginosa NOT DETECTED NOT DETECTED Final   Stenotrophomonas maltophilia NOT DETECTED NOT DETECTED Final   Candida albicans NOT DETECTED NOT DETECTED Final   Candida auris NOT DETECTED NOT DETECTED Final   Candida glabrata NOT DETECTED NOT DETECTED Final   Candida krusei NOT DETECTED NOT DETECTED Final   Candida parapsilosis NOT DETECTED NOT DETECTED Final   Candida tropicalis NOT DETECTED NOT DETECTED Final   Cryptococcus neoformans/gattii NOT DETECTED NOT DETECTED Final    Comment: Performed at [facility], [address] [place], [place] [address]  Blood Culture (routine x 2)     Status: Abnormal (Preliminary result)   Collection Time: 05/04/21  9:29 PM   Specimen: BLOOD  Result Value Ref Range Status   Specimen Description   Final    BLOOD RIGHT ANTECUBITAL Performed at [facility], [address] [place], [place] [address]    Special Requests   Final    BOTTLES DRAWN AEROBIC AND ANAEROBIC Blood Culture adequate volume Performed at [facility], [address] [place], [place] [address]    Culture  Setup Time   Final    GRAM POSITIVE COCCI IN BOTH AEROBIC AND ANAEROBIC BOTTLES CRITICAL VALUE NOTED.  VALUE IS CONSISTENT WITH  PREVIOUSLY REPORTED AND CALLED VALUE. Performed at [facility], [address], [place], [place] [address]    Culture (A)  Final    GROUP A STREP (S.PYOGENES) ISOLATED HEALTH DEPARTMENT NOTIFIED Performed at [facility], [place] [address], [place], [place] [address]    Report Status PENDING  Incomplete  Resp Panel by RT-PCR (Flu A&B, Covid) Nasopharyngeal Swab     Status: None   Collection Time: 05/04/21  9:50 PM   Specimen: Nasopharyngeal Swab; Nasopharyngeal(NP) swabs in vial transport medium  Result Value Ref Range Status   SARS Coronavirus 2 by RT PCR NEGATIVE NEGATIVE Final    Comment: (NOTE) SARS-CoV-2 target nucleic acids are NOT DETECTED.  The SARS-CoV-2 RNA is generally detectable in upper respiratory specimens during the acute phase of infection. The lowest concentration of SARS-CoV-2 viral copies this assay can detect is 138 copies/mL. A negative result does not preclude SARS-Cov-2 infection and should not be used as the sole basis for treatment or other patient management decisions. A negative result may occur with  improper specimen collection/handling, submission of specimen other than nasopharyngeal swab, presence of viral mutation(s) within the areas targeted by this assay, and inadequate number of viral copies(<138 copies/mL). A negative result must be combined with clinical observations, patient history, and epidemiological information. The expected result is Negative.  Fact Sheet for Patients:  EntrepreneurPulse.com.au  Fact Sheet for Healthcare Providers:  IncredibleEmployment.be  This test is no t yet approved or cleared by the [place] FDA and  has been authorized for detection and/or diagnosis of  SARS-CoV-2 by FDA under an Emergency Use Authorization (EUA). This EUA will remain  in effect (meaning this test can be used) for the duration of the COVID-19 declaration under Section 564(b)(1) of the Act, 21 U.S.C.section 360bbb-3(b)(1), unless the authorization is terminated  or revoked sooner.       Influenza A by PCR NEGATIVE NEGATIVE Final   Influenza B by PCR NEGATIVE NEGATIVE Final    Comment: (NOTE) The Xpert Xpress SARS-CoV-2/FLU/RSV plus assay is intended as an aid in the diagnosis of influenza from Nasopharyngeal swab specimens and should not be used as a sole basis for treatment. Nasal washings and aspirates are unacceptable for Xpert Xpress SARS-CoV-2/FLU/RSV testing.  Fact Sheet for Patients: EntrepreneurPulse.com.au  Fact Sheet for Healthcare Providers: IncredibleEmployment.be  This test is not yet approved or cleared by the [place] FDA and has been authorized for detection and/or diagnosis of SARS-CoV-2 by FDA under an Emergency Use Authorization (EUA). This EUA will remain in effect (meaning this test can be used) for the duration of the COVID-19 declaration under Section 564(b)(1) of the Act, 21 U.S.C. section 360bbb-3(b)(1), unless the authorization is terminated or revoked.  Performed at [facility], [address] [place], [place] [address]   MRSA Next Gen by PCR, Nasal     Status: None   Collection Time: 05/05/21 11:02 AM   Specimen: Nasal Mucosa; Nasal Swab  Result Value Ref Range Status   MRSA by PCR Next Gen NOT DETECTED NOT DETECTED Final    Comment: (NOTE) The GeneXpert MRSA Assay (FDA approved for NASAL specimens only), is one component of a comprehensive MRSA colonization surveillance program. It is not intended to diagnose MRSA infection nor to guide or monitor treatment for MRSA infections. Test performance is not FDA approved in patients less than 54 years old. Performed at [facility], [address] [place], [place] [address]     IMAGING RESULTS:  I have personally reviewed the films ? Impression/Recommendation streptococcus pyogenes bacteremia with streptococcal toxic shock syndrome secondary left leg cellulitis On appropriate antibiotics PCN/Clindamycin Will repeat blood culture Would recommend surgery to see her even if no intervention may be needed now ? ?AKI  New onset Afib- on Amiodarone COPD OSA DM- on insulin Anemia  ___________________________________________________ Discussed with family at bed side, requesting provider Note:  This document was prepared using Dragon voice recognition software and may include unintentional dictation errors.

## 2021-05-06 NOTE — Consult Note (Signed)
PHARMACY CONSULT NOTE - FOLLOW UP  Pharmacy Consult for Electrolyte Monitoring and Replacement   Recent Labs: Potassium (mmol/L)  Date Value  05/06/2021 4.2  08/11/2013 3.3 (L)   Magnesium (mg/dL)  Date Value  05/06/2021 2.4   Calcium (mg/dL)  Date Value  05/06/2021 7.4 (L)   Calcium, Total (mg/dL)  Date Value  08/11/2013 9.2   Albumin (g/dL)  Date Value  05/04/2021 2.8 (L)  08/11/2013 3.1 (L)   Phosphorus (mg/dL)  Date Value  05/06/2021 4.4   Sodium (mmol/L)  Date Value  05/06/2021 138  08/11/2013 138     Assessment: Pharmacy has been consulted to monitor and replace electrolytes in 71yo patient with severe sepsis and strep pyogenes bacteremia.   K: 3.3>3.8>4.2 Phos: 2.8>4.5>4.4 Mg: 1.5>2.2>2.4  Goal of Therapy:  Electrolytes WNL  Plan:  Lytes improved to WNL, currently stable. No additional repletion warranted at this time. Will f/u electrolytes with AM labs  Lorna Dibble ,PharmD Clinical Pharmacist 05/06/2021 7:57 AM

## 2021-05-06 NOTE — Progress Notes (Signed)
Dr. Mortimer Fries notified about low BP.  Midodrine started.

## 2021-05-06 NOTE — Consult Note (Signed)
Cardiology Consult    Patient ID: Alexandra Foster MRN: [id number], DOB/AGE: 1950/08/16   Admit date: 05/04/2021 Date of Consult: 05/06/2021  Primary Physician: Steele Sizer, MD Primary Cardiologist: Kathlyn Sacramento, MD - new Requesting Provider: Corbin Ade, MD  Patient Profile    Alexandra Foster is a 70 y.o. female with a history of asthma/COPD, HTN, DMII, obesity, OSA on CPAP, GERD, and COVID-19 pneumonia, who is being seen today for the evaluation of afib w/ RVR in the setting of admission for lower extremity cellulitis, S pyogenes bacteremia, shock, and AKI at the request of Dr. Mortimer Fries.  Past Medical History   Past Medical History:  Diagnosis Date   AKI (acute kidney injury) ([place])    a. 04/2021 in setting of bacteremia/shock.   Arthritis    Asthma    Bacteremia    a. 04/2021 S pyogenes bacteremia in setting of lower ext cellulitis.   COPD (chronic obstructive pulmonary disease) (HCC)    Diabetes mellitus without complication (HCC)    Endometriosis    GERD (gastroesophageal reflux disease)    History of echocardiogram    a. 07/2013 Echo: EF 55-60%, impaired relaxation, mild TR; b. 04/2021 Echo: EF 50-55%, mild LVH, nl RV fxn, mild BAE, Ao sclerosis w/o stenosis.   Hypertension    Obesity    PAF (paroxysmal atrial fibrillation) ([place])    a. 04/2021 in setting of septic shock/cellulitis.   Sleep apnea    CPAP    Past Surgical History:  Procedure Laterality Date   ABDOMINAL HYSTERECTOMY     COLONOSCOPY  10/29/2006   Dr Allen Norris   COLONOSCOPY WITH PROPOFOL N/A 11/05/2016   Procedure: COLONOSCOPY WITH PROPOFOL;  Surgeon: Robert Bellow, MD;  Location: [facility] ENDOSCOPY;  Service: Endoscopy;  Laterality: N/A;   NASAL SINUS SURGERY  2002   Dr Carlis Abbott     Allergies  No Known Allergies  History of Present Illness    71 y/o ? w/ a h/o  asthma/COPD, HTN, DMII, obesity, OSA on CPAP, GERD, and COVID-19 pneumonia.  She prev had an echo in 07/2013, which showed an EF of 55-60% w/  impaired relaxation.  She otw does not have a cardiac history.  She lives locally w/ her husband.  She is sedentary, but is generally able to carry out routine house chores w/o limitations.  She does have some degree of chronic DOE in the setting of prior heavy tobacco abuse (quit ~ 2004) and asthma.  She does not require supplemental O2 @ home, but does use inhalers/nebs, and wears CPAP @ night.   Alexandra Foster was in her [place] until 1/17, when she began to note increasing L lower ext edema.  Over the course of the week, her husband started to note alteration in mental status and pt also developed fever and chills.  Family called EMS on the evening of 1/21 and pt was taken to the [facility] ED.  Here, she had a low-grade fever, and was noted to have an elevated lactate, which eventually rose 6.3; AKI w/ creat up to 3.47 (prev nl on 02/11/2021), and leukocytosis (up to 17.6).  BC grew S pyogenes.  LE u/s was neg for DVT.  She was placed on abx and required vasopressor therapy in the setting of septic shock/hypotension, and admitted to ICU.  Pt responded well to abx and IVF.  She has not required vasopressors in the past 24 hrs.  Unfortunately @ 06:20, she developed afib w/ RVR w/ rates into the 160's.  She was placed on IV amio w/ rates currently trending in the 140's.  She has been completely asymptomatic and denies c/p, dyspnea, or palpitations.  Echo this AM, while in rapid afib, showed low normal EF @ 50-55%.  Family @ bedside.  All questions answered.  Inpatient Medications     aspirin EC  81 mg Oral Daily   atorvastatin  40 mg Oral Daily   Chlorhexidine Gluconate Cloth  6 each Topical Q0600   heparin  4,300 Units Intravenous Once   insulin aspart  0-5 Units Subcutaneous QHS   insulin aspart  0-9 Units Subcutaneous TID WC   mometasone-formoterol  2 puff Inhalation BID   montelukast  10 mg Oral QHS   pantoprazole  40 mg Oral Daily   umeclidinium bromide  1 puff Inhalation Daily    Family History     Family History  Problem Relation Age of Onset   Congestive Heart Failure Mother    Coronary artery disease Father 28   Breast cancer Sister 32   Heart disease Brother    Varicose Veins Brother    Alcohol abuse Brother    She indicated that her mother is deceased. She indicated that her father is deceased. She indicated that her sister is alive. She indicated that two of her three brothers are alive.   Social History    Social History   Socioeconomic History   Marital status: Married    Spouse name: Trilby Drummer   Number of children: 1   Years of education: 12   Highest education level: 12th grade  Occupational History    Employer: RETIRED  Tobacco Use   Smoking status: Former    Packs/day: 2.00    Years: 40.00    Pack years: 80.00    Types: Cigarettes    Quit date: 2003    Years since quitting: 20.0   Smokeless tobacco: Former    Types: Snuff    Quit date: 04/2001   Tobacco comments:    smoking cessation materials not required  Vaping Use   Vaping Use: Never used  Substance and Sexual Activity   Alcohol use: No    Alcohol/week: 0.0 standard drinks   Drug use: No   Sexual activity: Not Currently  Other Topics Concern   Not on file  Social History Narrative   Lives locally w/ husband.  Does not routinely exercise.   Social Determinants of Health   Financial Resource Strain: Medium Risk   Difficulty of Paying Living Expenses: Somewhat hard  Food Insecurity: No Food Insecurity   Worried About Charity fundraiser in the Last Year: Never true   Ran Out of Food in the Last Year: Never true  Transportation Needs: No Transportation Needs   Lack of Transportation (Medical): No   Lack of Transportation (Non-Medical): No  Physical Activity: Inactive   Days of Exercise per Week: 0 days   Minutes of Exercise per Session: 0 min  Stress: No Stress Concern Present   Feeling of Stress : Not at all  Social Connections: Moderately Integrated   Frequency of Communication with  Friends and Family: More than three times a week   Frequency of Social Gatherings with Friends and Family: More than three times a week   Attends Religious Services: More than 4 times per year   Active Member of [employer] or Organizations: No   Attends Archivist Meetings: Never   Marital Status: Married  Human resources officer Violence: Not At Risk   Fear  of Current or Ex-Partner: No   Emotionally Abused: No   Physically Abused: No   Sexually Abused: No     Review of Systems    General:  +++ chills/fever/AMS prior to admission.  No night sweats or weight changes.  Cardiovascular:  No chest pain, +++ chronic dyspnea on exertion, +++ L lower ext edema x 1 wk, no orthopnea, palpitations, paroxysmal nocturnal dyspnea. Dermatological: No rash, lesions/masses Respiratory: No cough, +++ chronic dyspnea on exertion. Urologic: No hematuria, dysuria Abdominal:   No nausea, vomiting, diarrhea, bright red blood per rectum, melena, or hematemesis Neurologic:  +++ AMS/generalized wkns prior to admission.  No visual changes. All other systems reviewed and are otherwise negative except as noted above.  Physical Exam    Blood pressure (!) 84/63, pulse 83, temperature 99.5 F (37.5 C), temperature source Oral, resp. rate (!) 25, height 5\' 5"  (1.651 m), weight 123.4 kg, SpO2 96 %.  General: Pleasant, NAD Psych: Flat affect. Neuro: Alert and oriented X 3. Moves all extremities spontaneously. HEENT: Normal  Neck: Supple, obese, difficult to gauge JVP.  No bruits. Lungs:  Resp regular and unlabored, diminished breath sounds bilat. Heart: IR, IR, tachy, distant, no s3, s4, or murmurs. Abdomen: Obese, soft, non-tender, non-distended, BS + x 4.  Extremities: No clubbing, cyanosis.  3+ L LE edema w/ large fluid filled blisters to L anterior shin and L lateral ankle. DP/PT1+, Radials 2+ and equal bilaterally.  Labs    Cardiac Enzymes No results for input(s): TROPONINIHS in the last 720 hours.    Lab  Results  Component Value Date   WBC 16.2 (H) 05/06/2021   HGB 10.5 (L) 05/06/2021   HCT 33.4 (L) 05/06/2021   MCV 75.9 (L) 05/06/2021   PLT 240 05/06/2021    Recent Labs  Lab 05/04/21 1943 05/05/21 0454 05/06/21 0325  NA 141   < > 138  K 2.9*   < > 4.2  CL 99   < > 104  CO2 26   < > 23  BUN 44*   < > 60*  CREATININE 3.47*   < > 3.46*  CALCIUM 8.7*   < > 7.4*  PROT 7.9  --   --   BILITOT 1.1  --   --   ALKPHOS 103  --   --   ALT 48*  --   --   AST 67*  --   --   GLUCOSE 144*   < > 189*   < > = values in this interval not displayed.   Lab Results  Component Value Date   CHOL 106 02/11/2021   HDL 30 (L) 02/11/2021   LDLCALC 62 02/11/2021   TRIG 67 02/11/2021   Lab Results  Component Value Date   DDIMER 2.22 (H) 05/05/2021     Radiology Studies    CT Tibia Fibula Left Wo Contrast  Result Date: 05/04/2021 CLINICAL DATA:  Concern for soft tissue infection. EXAM: CT OF THE LOWER LEFT EXTREMITY WITHOUT CONTRAST TECHNIQUE: Multidetector CT imaging of the lower left extremity was performed according to the standard protocol. RADIATION DOSE REDUCTION: This exam was performed according to the departmental dose-optimization program which includes automated exposure control, adjustment of the mA and/or kV according to patient size and/or use of iterative reconstruction technique. COMPARISON:  None. FINDINGS: Bones/Joint/Cartilage No acute fracture or dislocation. There is moderate arthritic changes of the knee with tricompartmental narrowing. Ligaments Suboptimally assessed by CT. Muscles and Tendons No acute findings. Soft tissues Diffuse  subcutaneous edema may represent cellulitis. No drainable fluid collection/abscess. No soft tissue gas. IMPRESSION: Diffuse subcutaneous edema may represent cellulitis. No drainable fluid collection/abscess. Electronically Signed   By: Anner Crete M.D.   On: 05/04/2021 22:22   US Venous Img Lower Unilateral Left  Result Date:  05/04/2021 CLINICAL DATA:  Left lower extremity pain. EXAM: Left LOWER EXTREMITY VENOUS DOPPLER ULTRASOUND TECHNIQUE: Gray-scale sonography with compression, as well as color and duplex ultrasound, were performed to evaluate the deep venous system(s) from the level of the common femoral vein through the popliteal and proximal calf veins. COMPARISON:  None. FINDINGS: VENOUS Normal compressibility of the common femoral, superficial femoral, and popliteal veins, as well as the visualized calf veins. Visualized portions of profunda femoral vein and great saphenous vein unremarkable. No filling defects to suggest DVT on grayscale or color Doppler imaging. Doppler waveforms show normal direction of venous flow, normal respiratory plasticity and response to augmentation. Limited views of the contralateral common femoral vein are unremarkable. OTHER None. Limitations: none IMPRESSION: Negative. Electronically Signed   By: Anner Crete M.D.   On: 05/04/2021 23:39   DG Chest Port 1 View  Result Date: 05/04/2021 CLINICAL DATA:  Questionable sepsis. EXAM: PORTABLE CHEST 1 VIEW COMPARISON:  05/17/2019 FINDINGS: Cardiomegaly. No confluent airspace opacity, effusions or edema. No acute bony abnormality. IMPRESSION: No active disease. Electronically Signed   By: Rolm Baptise M.D.   On: 05/04/2021 21:03    ECG & Cardiac Imaging    05/02/2019 RSR, 78, no acute ST/T changes - personally reviewed. 12 lead from this admission pending.  Assessment & Plan    1.  Afib w/ RVR:  pt admitted w/ a several day h/o progressive L lower ext edema, fevers/chills, AMS, hypotension, AKI, sepsis, and S pyogenes bacteremia in the setting of L lower ext cellulitis.  She has responded well to abx and IVF, however, this AM, she developed rapid Afib into the 160's.  She is completely asymptomatic.  She has been placed on IV amiodarone by critical care team and rates are currently trending in the 140's.  Hemodynamically stable.  Echo this  AM w/ low-nl EF (50-55%).  CHA2DS2VASc = 4.  Will add IV heparin for the time being.  Soft BPs prevent usage of ? blocker/dilt, while AKI makes her a poor candidate for digoxin.  If she doesn't convert on IV amio this evening, or becomes hemodynamically unstable, we will need to consider DCCV on 1/24.  Will likely require long term [place].  2.  Septic shock/S pyogenes bacteremia/L lower extremity cellulitis:  slowly improving on IV abx.  Pressures stable and not currently requiring vasopressors.  3.  AKI:  in setting of #2.  Creat nl in 01/2021.  Not much improvement since admission  Creat 3.46 this AM.  Follow.  Avoid nephrotoxic agents.  4.  Essential HTN/Hypotension:  BP stable currently.  Follow closely in the setting of rapid afib.  5.  DMII:  per IM/CCM.  6.  Normocytic anemia:  H/H drifting down since admission.  Follow on heparin.  Signed, Murray Hodgkins, NP 05/06/2021, 4:00 PM  For questions or updates, please contact   Please consult [email or web address] for contact info under Cardiology/STEMI.

## 2021-05-06 NOTE — Progress Notes (Signed)
*  PRELIMINARY RESULTS* Echocardiogram 2D Echocardiogram has been performed.  Alexandra Foster 05/06/2021, 12:17 PM

## 2021-05-06 NOTE — Progress Notes (Signed)
VAST consult received to obtain 2nd IV access; pt on amiodarone and abx. Upon entering patient's room, noted IV pump beeping d/t patient bending arm. She stated she was right handed and this IV is continuously beeping every time she moves. Spoke with her about moving IV to a location where she would be able to move without issue and she agreed.  2 PIV's placed in left forearm area so that right ac IV can be dc'd. Informed patient's nurse.

## 2021-05-06 NOTE — Consult Note (Addendum)
ANTICOAGULATION CONSULT NOTE  Pharmacy Consult for heparin Indication: chest pain/ACS  No Known Allergies  Patient Measurements: Height: 5\' 5"  (165.1 cm) Weight: 123.4 kg (272 lb 0.8 oz) IBW/kg (Calculated) : 57 Heparin Dosing Weight: 86.1 kg  Vital Signs: Temp: 99.5 F (37.5 C) (01/23 1200) Temp Source: Oral (01/23 1200) BP: 84/63 (01/23 1300) Pulse Rate: 83 (01/23 1400)  Labs: Recent Labs    05/04/21 1943 05/05/21 0027 05/05/21 0454 05/05/21 1737 05/06/21 0325  HGB 12.5  --  10.8*  --  10.5*  HCT 40.8  --  35.7*  --  33.4*  PLT 282  --  228  --  240  APTT  --  36   33  --   --   --   LABPROT  --  16.1*  --   --   --   INR  --  1.3*  --   --   --   CREATININE 3.47*  --  3.30* 3.43* 3.46*    Estimated Creatinine Clearance: 19.4 mL/min (A) (by C-G formula based on SCr of 3.46 mg/dL (H)).   Medical History: Past Medical History:  Diagnosis Date   AKI (acute kidney injury) ([place])    a. 04/2021 in setting of bacteremia/shock.   Arthritis    Asthma    Bacteremia    a. 04/2021 S pyogenes bacteremia in setting of lower ext cellulitis.   COPD (chronic obstructive pulmonary disease) (H[facility])    Diabetes mellitus without complication (H[facility])    Endometriosis    GERD (gastroesophageal reflux disease)    History of echocardiogram    a. 07/2013 Echo: EF 55-60%, impaired relaxation, mild TR; b. 04/2021 Echo: EF 50-55%, mild LVH, nl RV fxn, mild BAE, Ao sclerosis w/o stenosis.   Hypertension    Obesity    PAF (paroxysmal atrial fibrillation) ([place])    a. 04/2021 in setting of septic shock/cellulitis.   Sleep apnea    CPAP    Medications:  Medications Prior to Admission  Medication Sig Dispense Refill Last Dose   albuterol (PROVENTIL) (2.5 MG/3ML) 0.083% nebulizer solution Take 3 mLs (2.5 mg total) by nebulization every 6 (six) hours as needed for wheezing or shortness of breath. 150 mL 1 Past Month   aspirin 81 MG tablet Take 1 tablet (81 mg total) by mouth daily. 30 tablet 0  Past Month   atorvastatin (LIPITOR) 40 MG tablet Take 1 tablet (40 mg total) by mouth daily. 90 tablet 1 Past Month   azelastine (ASTELIN) 0.1 % nasal spray Place 2 sprays into both nostrils 2 (two) times daily as needed for rhinitis or allergies. 30 mL 1 Past Month   baclofen (LIORESAL) 10 MG tablet Take 10 mg by mouth daily.   Past Month   celecoxib (CELEBREX) 200 MG capsule Take 200 mg by mouth daily.   Past Month   Cetirizine HCl (ZYRTEC ALLERGY) 10 MG TBDP Take 10 mg by mouth at bedtime. For sinus symptoms and seasonal allergies 30 tablet 1 Past Month   docusate sodium (COLACE) 100 MG capsule Take 100 mg by mouth daily.   Past Month   fluticasone (FLONASE) 50 MCG/ACT nasal spray    Past Month   glucose blood (ACCU-CHEK AVIVA PLUS) test strip Use as instructed 100 each 12 Past Month   Lancets (ACCU-CHEK SOFT TOUCH) lancets Use as instructed 100 each 12 Past Month   meloxicam (MOBIC) 15 MG tablet Take 15 mg by mouth daily.   Past Month   metFORMIN (GLUCOPHAGE-XR)  750 MG 24 hr tablet Take 1 tablet (750 mg total) by mouth daily with breakfast. 90 tablet 1 Past Month   montelukast (SINGULAIR) 10 MG tablet Take 1 tablet (10 mg total) by mouth at bedtime. 90 tablet 1 Past Month   Multiple Vitamins-Minerals (MULTIVITAL PO) Take 1 tablet by mouth daily.   Past Month   omeprazole (PRILOSEC) 20 MG capsule Take 1 capsule (20 mg total) by mouth daily. 90 capsule 1 Past Month   ORTHOVISC 30 MG/2ML SOSY    Past Month   triamterene-hydrochlorothiazide (DYAZIDE) 37.5-25 MG capsule TAKE 1 CAPSULE EVERY DAY 90 capsule 1 Past Month   vitamin B-12 (CYANOCOBALAMIN) 100 MCG tablet Take 100 mcg by mouth daily.   Past Month   calcium carbonate (OSCAL) 1500 (600 Ca) MG TABS tablet Take 600 mg of elemental calcium by mouth 2 (two) times daily with a meal. (Patient not taking: Reported on 05/05/2021)   Not Taking   Fluticasone-Umeclidin-Vilant (TRELEGY ELLIPTA) 100-62.5-25 MCG/INH AEPB Inhale 1 puff into the lungs daily.  In place of Stiolto 180 each 1 PRN at PRN   loratadine (CLARITIN) 10 MG tablet Take 1 tablet (10 mg total) by mouth daily. 90 tablet 1 PRN at PRN   Scheduled:   aspirin EC  81 mg Oral Daily   atorvastatin  40 mg Oral Daily   Chlorhexidine Gluconate Cloth  6 each Topical Q0600   heparin  5,000 Units Subcutaneous Q8H   insulin aspart  0-5 Units Subcutaneous QHS   insulin aspart  0-9 Units Subcutaneous TID WC   mometasone-formoterol  2 puff Inhalation BID   montelukast  10 mg Oral QHS   pantoprazole  40 mg Oral Daily   umeclidinium bromide  1 puff Inhalation Daily   Infusions:   sodium chloride Stopped (05/05/21 0816)   amiodarone 30 mg/hr (05/06/21 1350)   clindamycin (CLEOCIN) IV     pencillin G potassium IV 4 Million Units (05/06/21 0910)   PRN: docusate sodium, oxyCODONE, polyethylene glycol Anti-infectives (From admission, onward)    Start     Dose/Rate Route Frequency Ordered Stop   05/06/21 2200  clindamycin (CLEOCIN) IVPB 900 mg        900 mg 100 mL/hr over 30 Minutes Intravenous Every 8 hours 05/06/21 1519     05/06/21 0030  penicillin G potassium 4 Million Units in dextrose 5 % 250 mL IVPB        4 Million Units 250 mL/hr over 60 Minutes Intravenous Every 8 hours 05/05/21 1023     05/05/21 1400  clindamycin (CLEOCIN) IVPB 600 mg  Status:  Discontinued        600 mg 100 mL/hr over 30 Minutes Intravenous Every 8 hours 05/05/21 1010 05/06/21 1519   05/05/21 1200  penicillin G potassium 4 Million Units in dextrose 5 % 250 mL IVPB  Status:  Discontinued        4 Million Units 250 mL/hr over 60 Minutes Intravenous Every 4 hours 05/05/21 1018 05/05/21 1024   05/04/21 2345  ceFEPIme (MAXIPIME) 2 g in sodium chloride 0.9 % 100 mL IVPB  Status:  Discontinued        2 g 200 mL/hr over 30 Minutes Intravenous Every 24 hours 05/04/21 2328 05/05/21 1012   05/04/21 2345  vancomycin (VANCOREADY) IVPB 1500 mg/300 mL        1,500 mg 150 mL/hr over 120 Minutes Intravenous  Once 05/04/21  2337 05/05/21 0349   05/04/21 2338  vancomycin variable dose per unstable  renal function (pharmacist dosing)  Status:  Discontinued         Does not apply See admin instructions 05/04/21 2338 05/05/21 1030   05/04/21 2330  vancomycin (VANCOCIN) IVPB 1000 mg/200 mL premix  Status:  Discontinued        1,000 mg 200 mL/hr over 60 Minutes Intravenous  Once 05/04/21 2328 05/04/21 2336   05/04/21 2045  vancomycin (VANCOCIN) IVPB 1000 mg/200 mL premix        1,000 mg 200 mL/hr over 60 Minutes Intravenous  Once 05/04/21 2040 05/04/21 2243   05/04/21 2045  cefTRIAXone (ROCEPHIN) 2 g in sodium chloride 0.9 % 100 mL IVPB        2 g 200 mL/hr over 30 Minutes Intravenous  Once 05/04/21 2040 05/04/21 2206       Assessment: Pharmacy consulted to heparin for ACS. No trops ordered. ECG with no acute ST/T changes per note. Pt does have new onset afib. CHA2DS2VASc 4.   Goal of Therapy:  Heparin level 0.3-0.7 units/ml Monitor platelets by anticoagulation protocol: Yes   Plan:  Give 4300 units bolus x 1 Start heparin infusion at 1200 units/hr Check anti-Xa level in 8 hours and daily while on heparin Continue to monitor H&H and platelets  Oswald Hillock, PharmD, BCPS 05/06/2021,3:48 PM

## 2021-05-06 NOTE — Progress Notes (Signed)
NAME:  Alexandra Foster, MRN:  [id number], DOB:  08-May-1950, LOS: 2 ADMISSION DATE:  05/04/2021   BRIEF SYNOPSIS 71 y.o female with medical history significant of Asthma/COPD, T2DM, HTN, OSA on CPAP, GERD, COVID-19 pneumonia, and Bilateral lower extremity edema who presented to the ED with chief complaints of LLE pain and chills since Thursday.   Patient given 30 cc/kg of fluids and started on broad-spectrum antibiotics for sepsis with septic shock. Patient remained hypotensive despite IVF boluses therefore was started on Levophed. PCCM consulted.   Past Medical History     Arthritis   Asthma   COPD (chronic obstructive pulmonary disease) ([place])   Diabetes mellitus without complication ([place])   Endometriosis   GERD (gastroesophageal reflux disease)   Hypertension   Obesity   Sleep apnea    CPAP    Significant Hospital Events   05/04/21: Admitted to the ICU with severe sepsis with shock secondary to cellulitis of LLE  1/23 off pressors +Afib with RVR   Significant Diagnostic Tests:  1/21: Chest Xray> no active cardiopulmonary process 1/21: CT left lower leg>Diffuse subcutaneous edema may represent cellulitis. No drainable fluid collection/abscess 1/21: Ultrasound lower unilateral left> no DVT   Micro Data:  1/21: SARS-CoV-2 PCR> negative 1/21: Influenza PCR> negative 1/21: Blood culture x2> 1/21: Urine Culture> 1/21: MRSA PCR>>    Antimicrobials:  Vancomycin 1/21> Cefepime 1/21> Ceftriaxone 1/21 X1 Micro Data:  1/15 COVID NEG 1/21 strep pyogenes bacteremia      Interim History / Subjective:  Alert and awake Severe afib with RVR HR 160's Admitted for severe sepsis      Objective   Blood pressure 94/75, pulse (!) 106, temperature 98.8 F (37.1 C), temperature source Axillary, resp. rate 12, height 5\' 5"  (1.651 m), weight 123.4 kg, SpO2 95 %.        Intake/Output Summary (Last 24 hours) at 05/06/2021 9937 Last data filed at 05/06/2021 0400 Gross per 24  hour  Intake --  Output 700 ml  Net -700 ml   Filed Weights   05/04/21 1941 05/05/21 1100  Weight: 120.7 kg 123.4 kg    ROS +pain No chest pain no SOB ALL OTHER ROS NEGATIVE  GENERAL: 71 year-old critically ill patient lying in the bed with no acute distress.  EYES: Pupils equal, round, reactive to light and accommodation. No scleral icterus. Extraocular muscles intact.  LUNGS: Normal breath sounds bilaterally, no wheezing CARDIOVASCULAR: S1, S2 normal.  ABDOMEN: Soft, nontender EXTREMITIES: Left lower extremity edema, erythema, NO cyanosis, or clubbing.  NEUROLOGIC: Cranial nerves II through XII are intact.  Muscle strength in LOWER extremities decreased. Sensation intact. Gait not checked.  PSYCHIATRIC: The patient is alert and oriented x 3.  SKIN: No obvious rash, lesion, or ulcer.       Labs/imaging that I havepersonally reviewed  (right click and "Reselect all SmartList Selections" daily)      ASSESSMENT AND PLAN SYNOPSIS  Admitted for Severe Sepsis with shock  Secondary to Cellulitis of left lower leg strep pyogenes bacteremia with acute renla failure and new onset afib with RVR  SEPTIC shock SOURCE-left leg -use vasopressors to keep MAP>65 as needed -follow ABG and LA as needed -follow up cultures -consider stress dose steroids   CARDIAC new onset afib wth RVR ICU monitoring Start amio bolus  and infusion   ACUTE KIDNEY INJURY/Renal Failure -continue Foley Catheter-assess need -Avoid nephrotoxic agents -Follow urine output, BMP -Ensure adequate renal perfusion, optimize oxygenation -Renal dose medications   Intake/Output Summary (  Last 24 hours) at 05/06/2021 9326 Last data filed at 05/06/2021 0400 Gross per 24 hour  Intake --  Output 700 ml  Net -700 ml    INFECTIOUS DISEASE -continue antibiotics as prescribed -follow up cultures -follow up ID consultation  ENDO - ICU hypoglycemic\Hyperglycemia protocol -check FSBS per  protocol   GI GI PROPHYLAXIS as indicated  NUTRITIONAL STATUS DIET--> as tolerated Constipation protocol as indicated   ELECTROLYTES -follow labs as needed -replace as needed -pharmacy consultation and following   ACUTE ANEMIA- TRANSFUSE AS NEEDED CONSIDER TRANSFUSION  IF HGB<7 DVT PRX with TED/SCD's ONLY     Best practice:  Diet:  Oral Pain/Anxiety/Delirium protocol (if indicated): No VAP protocol (if indicated): Not indicated DVT prophylaxis: Subcutaneous Heparin GI prophylaxis: PPI Glucose control:  SSI Yes Central venous access:  N/A Arterial line:  N/A Foley:  N/A Mobility:  bed rest  PT consulted: N/A Last date of multidisciplinary goals of care discussion [1/21] Code Status:  full code Disposition: ICU   Labs   CBC: Recent Labs  Lab 05/04/21 1943 05/05/21 0454 05/06/21 0325  WBC 16.5* 17.6* 16.2*  NEUTROABS 15.5*  --   --   HGB 12.5 10.8* 10.5*  HCT 40.8 35.7* 33.4*  MCV 78.5* 77.9* 75.9*  PLT 282 228 712    Basic Metabolic Panel: Recent Labs  Lab 05/04/21 1943 05/05/21 0454 05/05/21 1153 05/05/21 1737 05/06/21 0325  NA 141 140  --  139 138  K 2.9* 3.3*  --  3.8 4.2  CL 99 106  --  105 104  CO2 26 21*  --  18* 23  GLUCOSE 144* 114*  --  166* 189*  BUN 44* 48*  --  54* 60*  CREATININE 3.47* 3.30*  --  3.43* 3.46*  CALCIUM 8.7* 7.8*  --  7.7* 7.4*  MG  --  1.5* 2.2  --  2.4  PHOS  --  2.8 4.5  --  4.4   GFR: Estimated Creatinine Clearance: 19.4 mL/min (A) (by C-G formula based on SCr of 3.46 mg/dL (H)). Recent Labs  Lab 05/04/21 1943 05/04/21 2130 05/04/21 2150 05/05/21 0453 05/05/21 0454 05/05/21 1153 05/05/21 1737 05/05/21 2017 05/06/21 0325  PROCALCITON  --  35.76  --   --  40.92  --   --   --  66.16  WBC 16.5*  --   --   --  17.6*  --   --   --  16.2*  LATICACIDVEN 3.8*  --    < > 4.2*  --  3.7* 4.1* 2.9*  --    < > = values in this interval not displayed.    Liver Function Tests: Recent Labs  Lab 05/04/21 1943   AST 67*  ALT 48*  ALKPHOS 103  BILITOT 1.1  PROT 7.9  ALBUMIN 2.8*   Coagulation Profile: Recent Labs  Lab 05/05/21 0027  INR 1.3*    Cardiac Enzymes: No results for input(s): CKTOTAL, CKMB, CKMBINDEX, TROPONINI in the last 168 hours.  HbA1C: Hemoglobin A1C  Date/Time Value Ref Range Status  08/24/2020 03:50 PM 6.4 (A) 4.0 - 5.6 % Final  02/10/2020 01:24 PM 6.4 (A) 4.0 - 5.6 % Final   Hgb A1c MFr Bld  Date/Time Value Ref Range Status  02/11/2021 09:41 AM 6.3 (H) <5.7 % of total Hgb Final    Comment:    For someone without known diabetes, a hemoglobin  A1c value between 5.7% and 6.4% is consistent with prediabetes and should be confirmed  with a  follow-up test. . For someone with known diabetes, a value <7% indicates that their diabetes is well controlled. A1c targets should be individualized based on duration of diabetes, age, comorbid conditions, and other considerations. . This assay result is consistent with an increased risk of diabetes. . Currently, no consensus exists regarding use of hemoglobin A1c for diagnosis of diabetes for children. [name]   07/14/2019 09:40 AM 6.3 (H) <5.7 % of total Hgb Final    Comment:    For someone without known diabetes, a hemoglobin  A1c value between 5.7% and 6.4% is consistent with prediabetes and should be confirmed with a  follow-up test. . For someone with known diabetes, a value <7% indicates that their diabetes is well controlled. A1c targets should be individualized based on duration of diabetes, age, comorbid conditions, and other considerations. . This assay result is consistent with an increased risk of diabetes. . Currently, no consensus exists regarding use of hemoglobin A1c for diagnosis of diabetes for children. .     CBG: Recent Labs  Lab 05/05/21 0229 05/05/21 0755 05/05/21 1100 05/05/21 1556 05/05/21 2154  GLUCAP 123* 102* 108* 145* 166*   No Known Allergies   Home Medications  Prior to  Admission medications   Medication Sig Start Date End Date Taking? Authorizing Provider  albuterol (PROVENTIL) (2.5 MG/3ML) 0.083% nebulizer solution Take 3 mLs (2.5 mg total) by nebulization every 6 (six) hours as needed for wheezing or shortness of breath. 10/03/19  Yes Sowles, Drue Stager, MD  aspirin 81 MG tablet Take 1 tablet (81 mg total) by mouth daily. 01/08/16  Yes Sowles, Drue Stager, MD  atorvastatin (LIPITOR) 40 MG tablet Take 1 tablet (40 mg total) by mouth daily. 02/11/21  Yes Sowles, Drue Stager, MD  azelastine (ASTELIN) 0.1 % nasal spray Place 2 sprays into both nostrils 2 (two) times daily as needed for rhinitis or allergies. 07/31/20  Yes Delsa Grana, PA-C  baclofen (LIORESAL) 10 MG tablet Take 10 mg by mouth daily. 04/16/21  Yes [provider]  celecoxib (CELEBREX) 200 MG capsule Take 200 mg by mouth daily. 04/03/21  Yes [provider]  Cetirizine HCl (ZYRTEC ALLERGY) 10 MG TBDP Take 10 mg by mouth at bedtime. For sinus symptoms and seasonal allergies 07/31/20  Yes Delsa Grana, PA-C  docusate sodium (COLACE) 100 MG capsule Take 100 mg by mouth daily.   Yes [provider]  fluticasone Asencion Islam) 50 MCG/ACT nasal spray  08/31/20  Yes [provider]  glucose blood (ACCU-CHEK AVIVA PLUS) test strip Use as instructed 12/08/17  Yes Sowles, Drue Stager, MD  Lancets (ACCU-CHEK SOFT TOUCH) lancets Use as instructed 12/08/17  Yes Sowles, Drue Stager, MD  meloxicam (MOBIC) 15 MG tablet Take 15 mg by mouth daily. 04/23/21  Yes [provider]  metFORMIN (GLUCOPHAGE-XR) 750 MG 24 hr tablet Take 1 tablet (750 mg total) by mouth daily with breakfast. 02/11/21  Yes Sowles, Drue Stager, MD  montelukast (SINGULAIR) 10 MG tablet Take 1 tablet (10 mg total) by mouth at bedtime. 02/11/21  Yes Sowles, Drue Stager, MD  Multiple Vitamins-Minerals (MULTIVITAL PO) Take 1 tablet by mouth daily.   Yes [provider]  omeprazole (PRILOSEC) 20 MG capsule Take 1 capsule (20 mg total) by  mouth daily. 02/11/21  Yes Steele Sizer, MD  [place] 30 MG/2ML SOSY  10/09/20  Yes [provider]  triamterene-hydrochlorothiazide (DYAZIDE) 37.5-25 MG capsule TAKE 1 CAPSULE EVERY DAY 05/02/21  Yes Sowles, Drue Stager, MD  vitamin B-12 (CYANOCOBALAMIN) 100 MCG tablet Take 100  mcg by mouth daily.   Yes [provider]  calcium carbonate (OSCAL) 1500 (600 Ca) MG TABS tablet Take 600 mg of elemental calcium by mouth 2 (two) times daily with a meal. Patient not taking: Reported on 05/05/2021    [provider]  Fluticasone-Umeclidin-Vilant (TRELEGY ELLIPTA) 100-62.5-25 MCG/INH AEPB Inhale 1 puff into the lungs daily. In place of Stiolto 02/10/20   Steele Sizer, MD  loratadine (CLARITIN) 10 MG tablet Take 1 tablet (10 mg total) by mouth daily. 02/11/21   Steele Sizer, MD       DVT/GI PRX  assessed I Assessed the need for Labs I Assessed the need for Foley I Assessed the need for Central Venous Line Family Discussion when available I Assessed the need for Mobilization I made an Assessment of medications to be adjusted accordingly Safety Risk assessment completed  CASE DISCUSSED IN MULTIDISCIPLINARY ROUNDS WITH ICU TEAM     Critical Care Time devoted to patient care services described in this note is 40 minutes.   Critical care was necessary to treat /prevent imminent and life-threatening deterioration.  Patient is critically ill. Patient with Multiorgan failure and at high risk for cardiac arrest and death.    Corrin Parker, M.D.  [facility]  Medical Director [place] Director [facility] Cardio-Pulmonary Department

## 2021-05-07 ENCOUNTER — Encounter
Admission: EM | Disposition: A | Discharge status: Nursing Home (Includes ICF) | Payer: Self-pay | Source: Home / Self Care | Attending: Internal Medicine

## 2021-05-07 ENCOUNTER — Inpatient Hospital Stay: Payer: Medicare HMO

## 2021-05-07 ENCOUNTER — Encounter: Payer: Self-pay | Admitting: Internal Medicine

## 2021-05-07 ENCOUNTER — Inpatient Hospital Stay: Payer: Medicare HMO | Admitting: Anesthesiology

## 2021-05-07 ENCOUNTER — Other Ambulatory Visit: Payer: Self-pay

## 2021-05-07 DIAGNOSIS — L03116 Cellulitis of left lower limb: Secondary | ICD-10-CM | POA: Diagnosis not present

## 2021-05-07 DIAGNOSIS — I480 Paroxysmal atrial fibrillation: Secondary | ICD-10-CM | POA: Diagnosis not present

## 2021-05-07 DIAGNOSIS — I959 Hypotension, unspecified: Secondary | ICD-10-CM | POA: Diagnosis not present

## 2021-05-07 DIAGNOSIS — A419 Sepsis, unspecified organism: Secondary | ICD-10-CM | POA: Diagnosis not present

## 2021-05-07 DIAGNOSIS — A491 Streptococcal infection, unspecified site: Secondary | ICD-10-CM | POA: Diagnosis not present

## 2021-05-07 DIAGNOSIS — R6520 Severe sepsis without septic shock: Secondary | ICD-10-CM | POA: Diagnosis not present

## 2021-05-07 DIAGNOSIS — E872 Acidosis, unspecified: Secondary | ICD-10-CM | POA: Diagnosis not present

## 2021-05-07 HISTORY — PX: WOUND DEBRIDEMENT: SHX247

## 2021-05-07 LAB — CULTURE, BLOOD (ROUTINE X 2): Special Requests: ADEQUATE

## 2021-05-07 LAB — MAGNESIUM: Magnesium: 2.5 mg/dL — ABNORMAL HIGH (ref 1.7–2.4)

## 2021-05-07 LAB — CBC
HCT: 29.9 % — ABNORMAL LOW (ref 36.0–46.0)
Hemoglobin: 9.7 g/dL — ABNORMAL LOW (ref 12.0–15.0)
MCH: 23.8 pg — ABNORMAL LOW (ref 26.0–34.0)
MCHC: 32.4 g/dL (ref 30.0–36.0)
MCV: 73.3 fL — ABNORMAL LOW (ref 80.0–100.0)
Platelets: 294 10*3/uL (ref 150–400)
RBC: 4.08 MIL/uL (ref 3.87–5.11)
RDW: 14.8 % (ref 11.5–15.5)
WBC: 21.9 10*3/uL — ABNORMAL HIGH (ref 4.0–10.5)
nRBC: 0.5 % — ABNORMAL HIGH (ref 0.0–0.2)

## 2021-05-07 LAB — URINALYSIS, COMPLETE (UACMP) WITH MICROSCOPIC
Glucose, UA: NEGATIVE mg/dL
Leukocytes,Ua: NEGATIVE
Nitrite: NEGATIVE
Protein, ur: 100 mg/dL — AB
Specific Gravity, Urine: 1.015 (ref 1.005–1.030)
pH: 5 (ref 5.0–8.0)

## 2021-05-07 LAB — HEPARIN LEVEL (UNFRACTIONATED)
Heparin Unfractionated: 0.44 IU/mL (ref 0.30–0.70)
Heparin Unfractionated: 0.45 IU/mL (ref 0.30–0.70)

## 2021-05-07 LAB — BASIC METABOLIC PANEL
Anion gap: 14 (ref 5–15)
BUN: 69 mg/dL — ABNORMAL HIGH (ref 8–23)
CO2: 22 mmol/L (ref 22–32)
Calcium: 7 mg/dL — ABNORMAL LOW (ref 8.9–10.3)
Chloride: 98 mmol/L (ref 98–111)
Creatinine, Ser: 3.32 mg/dL — ABNORMAL HIGH (ref 0.44–1.00)
GFR, Estimated: 14 mL/min — ABNORMAL LOW (ref >60)
Glucose, Bld: 148 mg/dL — ABNORMAL HIGH (ref 70–99)
Potassium: 3.4 mmol/L — ABNORMAL LOW (ref 3.5–5.1)
Sodium: 134 mmol/L — ABNORMAL LOW (ref 135–145)

## 2021-05-07 LAB — LACTIC ACID, PLASMA
Lactic Acid, Venous: 1.7 mmol/L (ref 0.5–1.9)
Lactic Acid, Venous: 1.7 mmol/L (ref 0.5–1.9)

## 2021-05-07 LAB — GLUCOSE, CAPILLARY
Glucose-Capillary: 142 mg/dL — ABNORMAL HIGH (ref 70–99)
Glucose-Capillary: 148 mg/dL — ABNORMAL HIGH (ref 70–99)
Glucose-Capillary: 111 mg/dL — ABNORMAL HIGH (ref 70–99)

## 2021-05-07 LAB — PHOSPHORUS: Phosphorus: 4.5 mg/dL (ref 2.5–4.6)

## 2021-05-07 SURGERY — DEBRIDEMENT, WOUND
Anesthesia: General | Site: Leg Lower | Laterality: Left

## 2021-05-07 MED ORDER — LACTATED RINGERS IV BOLUS
1000.0000 mL | Freq: Once | INTRAVENOUS | Status: AC
  Administered 2021-05-07: 02:00:00 1000 mL via INTRAVENOUS

## 2021-05-07 MED ORDER — MIDAZOLAM HCL 2 MG/2ML IJ SOLN
INTRAMUSCULAR | Status: AC
  Filled 2021-05-07: qty 2

## 2021-05-07 MED ORDER — PHENYLEPHRINE HCL-NACL 20-0.9 MG/250ML-% IV SOLN
0.0000 ug/min | INTRAVENOUS | Status: DC
  Administered 2021-05-07: 11:00:00 60 ug/min via INTRAVENOUS
  Administered 2021-05-07: 03:00:00 20 ug/min via INTRAVENOUS
  Filled 2021-05-07 (×2): qty 250

## 2021-05-07 MED ORDER — PHENYLEPHRINE HCL (PRESSORS) 10 MG/ML IV SOLN
INTRAVENOUS | Status: AC
  Filled 2021-05-07: qty 1

## 2021-05-07 MED ORDER — PROPOFOL 1000 MG/100ML IV EMUL
0.0000 ug/kg/min | INTRAVENOUS | Status: DC
  Administered 2021-05-08: 05:00:00 25 ug/kg/min via INTRAVENOUS
  Filled 2021-05-07: qty 100

## 2021-05-07 MED ORDER — NOREPINEPHRINE BITARTRATE 1 MG/ML IV SOLN
INTRAVENOUS | Status: DC | PRN
  Administered 2021-05-07: 22:00:00 2 mL via INTRAVENOUS
  Administered 2021-05-07: 23:00:00 3 mL via INTRAVENOUS

## 2021-05-07 MED ORDER — FENTANYL 2500MCG IN NS 250ML (10MCG/ML) PREMIX INFUSION: 25.0000 ug/h | INTRAVENOUS | Status: DC

## 2021-05-07 MED ORDER — PROPOFOL 10 MG/ML IV BOLUS
INTRAVENOUS | Status: AC
  Filled 2021-05-07: qty 20

## 2021-05-07 MED ORDER — FENTANYL CITRATE (PF) 100 MCG/2ML IJ SOLN
INTRAMUSCULAR | Status: AC
  Filled 2021-05-07: qty 2

## 2021-05-07 MED ORDER — SODIUM CHLORIDE 0.9 % IV SOLN: INTRAVENOUS | Status: DC | PRN

## 2021-05-07 MED ORDER — DAKINS (1/4 STRENGTH) 0.125 % EX SOLN
CUTANEOUS | Status: DC | PRN
  Administered 2021-05-07: 1

## 2021-05-07 MED ORDER — LACTATED RINGERS IV BOLUS
1000.0000 mL | Freq: Once | INTRAVENOUS | Status: AC
  Administered 2021-05-07: 08:00:00 1000 mL via INTRAVENOUS

## 2021-05-07 MED ORDER — PROPOFOL 10 MG/ML IV BOLUS
INTRAVENOUS | Status: DC | PRN
Start: 2021-05-07 — End: 2021-05-07
  Administered 2021-05-07: 50 mg via INTRAVENOUS

## 2021-05-07 MED ORDER — BUPIVACAINE-EPINEPHRINE (PF) 0.25% -1:200000 IJ SOLN
INTRAMUSCULAR | Status: AC
  Filled 2021-05-07: qty 30

## 2021-05-07 MED ORDER — MIDAZOLAM HCL 2 MG/2ML IJ SOLN
INTRAMUSCULAR | Status: DC | PRN
Start: 2021-05-07 — End: 2021-05-07
  Administered 2021-05-07: 2 mg via INTRAVENOUS

## 2021-05-07 MED ORDER — ADULT MULTIVITAMIN W/MINERALS CH
1.0000 | ORAL_TABLET | Freq: Every day | ORAL | Status: DC
  Administered 2021-05-09 – 2021-05-10 (×2): 1 via ORAL
  Filled 2021-05-07 (×2): qty 1

## 2021-05-07 MED ORDER — ROCURONIUM BROMIDE 100 MG/10ML IV SOLN
INTRAVENOUS | Status: DC | PRN
  Administered 2021-05-07: 20 mg via INTRAVENOUS
  Administered 2021-05-07: 80 mg via INTRAVENOUS

## 2021-05-07 MED ORDER — CALCIUM CHLORIDE 10 % IV SOLN
INTRAVENOUS | Status: AC
  Filled 2021-05-07: qty 10

## 2021-05-07 MED ORDER — PENICILLIN G POT IN DEXTROSE 60000 UNIT/ML IV SOLN
3.0000 10*6.[IU] | INTRAVENOUS | Status: DC
  Administered 2021-05-08 – 2021-05-14 (×37): 3 10*6.[IU] via INTRAVENOUS
  Filled 2021-05-07 (×46): qty 50

## 2021-05-07 MED ORDER — POTASSIUM CHLORIDE CRYS ER 20 MEQ PO TBCR
40.0000 meq | EXTENDED_RELEASE_TABLET | Freq: Once | ORAL | Status: AC
  Administered 2021-05-07: 12:00:00 40 meq via ORAL
  Filled 2021-05-07: qty 2

## 2021-05-07 MED ORDER — DOCUSATE SODIUM 50 MG/5ML PO LIQD
100.0000 mg | Freq: Two times a day (BID) | ORAL | Status: DC
  Administered 2021-05-09 – 2021-05-20 (×14): 100 mg
  Filled 2021-05-07 (×16): qty 10

## 2021-05-07 MED ORDER — ENSURE MAX PROTEIN PO LIQD
11.0000 [oz_av] | Freq: Two times a day (BID) | ORAL | Status: DC
  Administered 2021-05-07 – 2021-05-09 (×2): 11 [oz_av] via ORAL
  Filled 2021-05-07: qty 330

## 2021-05-07 MED ORDER — FENTANYL 2500MCG IN NS 250ML (10MCG/ML) PREMIX INFUSION
INTRAVENOUS | Status: AC
  Administered 2021-05-07: 25 ug/h via INTRAVENOUS
  Filled 2021-05-07: qty 250

## 2021-05-07 MED ORDER — LACTATED RINGERS IV SOLN
INTRAVENOUS | Status: DC | PRN
Start: 2021-05-07 — End: 2021-05-07

## 2021-05-07 MED ORDER — SODIUM CHLORIDE 0.9 % IR SOLN
Status: DC | PRN
  Administered 2021-05-07: 2500 mL

## 2021-05-07 MED ORDER — PHENYLEPHRINE HCL (PRESSORS) 10 MG/ML IV SOLN
INTRAVENOUS | Status: DC | PRN
  Administered 2021-05-07 (×3): 80 ug via INTRAVENOUS
  Administered 2021-05-07 (×3): 160 ug via INTRAVENOUS
  Administered 2021-05-07: 80 ug via INTRAVENOUS

## 2021-05-07 MED ORDER — NOREPINEPHRINE 4 MG/250ML-% IV SOLN
INTRAVENOUS | Status: AC
  Filled 2021-05-07: qty 250

## 2021-05-07 MED ORDER — DAKINS (1/4 STRENGTH) 0.125 % EX SOLN
CUTANEOUS | Status: AC
  Filled 2021-05-07: qty 473

## 2021-05-07 MED ORDER — FENTANYL BOLUS VIA INFUSION
25.0000 ug | INTRAVENOUS | Status: DC | PRN
  Filled 2021-05-07: qty 100

## 2021-05-07 MED ORDER — SUCCINYLCHOLINE CHLORIDE 200 MG/10ML IV SOSY
PREFILLED_SYRINGE | INTRAVENOUS | Status: DC | PRN
  Administered 2021-05-07: 140 mg via INTRAVENOUS

## 2021-05-07 MED ORDER — POLYETHYLENE GLYCOL 3350 17 G PO PACK
17.0000 g | PACK | Freq: Every day | ORAL | Status: DC
  Administered 2021-05-09 – 2021-05-12 (×2): 17 g
  Filled 2021-05-07 (×4): qty 1

## 2021-05-07 MED ORDER — FENTANYL CITRATE (PF) 100 MCG/2ML IJ SOLN
INTRAMUSCULAR | Status: DC | PRN
  Administered 2021-05-07 (×2): 50 ug via INTRAVENOUS

## 2021-05-07 MED ORDER — CALCIUM CHLORIDE 10 % IV SOLN
INTRAVENOUS | Status: DC | PRN
Start: 2021-05-07 — End: 2021-05-07
  Administered 2021-05-07 (×2): 1 g via INTRAVENOUS

## 2021-05-07 MED ORDER — PROPOFOL 1000 MG/100ML IV EMUL
INTRAVENOUS | Status: AC
  Administered 2021-05-07: 5 ug/kg/min via INTRAVENOUS
  Filled 2021-05-07: qty 100

## 2021-05-07 MED ORDER — NOREPINEPHRINE 4 MG/250ML-% IV SOLN
INTRAVENOUS | Status: DC | PRN
  Administered 2021-05-07: 8 ug/min via INTRAVENOUS

## 2021-05-07 SURGICAL SUPPLY — 38 items
APL PRP STRL LF DISP 70% ISPRP (MISCELLANEOUS)
BLADE CLIPPER SURG (BLADE) ×1 IMPLANT
BLADE SURG 15 STRL LF DISP TIS (BLADE) ×1 IMPLANT
BLADE SURG 15 STRL SS (BLADE) ×2
BNDG ELASTIC 6X5.8 VLCR STR LF (GAUZE/BANDAGES/DRESSINGS) ×2 IMPLANT
BNDG GAUZE ELAST 4 BULKY (GAUZE/BANDAGES/DRESSINGS) ×1 IMPLANT
CHLORAPREP W/TINT 26 (MISCELLANEOUS) ×1 IMPLANT
DRAIN PENROSE 12X.25 LTX STRL (MISCELLANEOUS) IMPLANT
DRAIN PENROSE 5/8X18 LTX STRL (DRAIN) IMPLANT
DRAPE 3/4 80X56 (DRAPES) ×1 IMPLANT
DRAPE IMP U-DRAPE 54X76 (DRAPES) ×1 IMPLANT
DRAPE LAPAROTOMY 77X122 PED (DRAPES) ×1 IMPLANT
ELECT REM PT RETURN 9FT ADLT (ELECTROSURGICAL) ×2
ELECTRODE REM PT RTRN 9FT ADLT (ELECTROSURGICAL) ×1 IMPLANT
GAUZE 4X4 16PLY ~~LOC~~+RFID DBL (SPONGE) ×2 IMPLANT
GAUZE SPONGE 4X4 12PLY STRL (GAUZE/BANDAGES/DRESSINGS) IMPLANT
GLOVE SURG ENC MOIS LTX SZ6.5 (GLOVE) ×4 IMPLANT
GLOVE SURG UNDER POLY LF SZ6.5 (GLOVE) ×4 IMPLANT
GOWN STRL REUS W/ TWL LRG LVL3 (GOWN DISPOSABLE) ×2 IMPLANT
GOWN STRL REUS W/TWL LRG LVL3 (GOWN DISPOSABLE) ×6
KIT TURNOVER KIT A (KITS) ×2 IMPLANT
MANIFOLD NEPTUNE II (INSTRUMENTS) ×3 IMPLANT
NEEDLE HYPO 22GX1.5 SAFETY (NEEDLE) ×2 IMPLANT
NS IRRIG 1000ML POUR BTL (IV SOLUTION) ×2 IMPLANT
PACK BASIN MINOR ARMC (MISCELLANEOUS) ×2 IMPLANT
PAD ABD DERMACEA PRESS 5X9 (GAUZE/BANDAGES/DRESSINGS) IMPLANT
SOL PREP PVP 2OZ (MISCELLANEOUS) ×2
SOLUTION PREP PVP 2OZ (MISCELLANEOUS) ×2 IMPLANT
SPONGE T-LAP 18X18 ~~LOC~~+RFID (SPONGE) ×6 IMPLANT
STOCKINETTE IMPERV 14X48 (MISCELLANEOUS) ×1 IMPLANT
SUT ETHILON 3-0 FS-10 30 BLK (SUTURE)
SUTURE EHLN 3-0 FS-10 30 BLK (SUTURE) IMPLANT
SWAB CULTURE AMIES ANAERIB BLU (MISCELLANEOUS) IMPLANT
SYR 10ML LL (SYRINGE) ×2 IMPLANT
SYR BULB IRRIG 60ML STRL (SYRINGE) ×2 IMPLANT
TOWEL OR 17X26 4PK STRL BLUE (TOWEL DISPOSABLE) ×3 IMPLANT
TRAY FOLEY SLVR 16FR LF STAT (SET/KITS/TRAYS/PACK) ×1 IMPLANT
WATER STERILE IRR 500ML POUR (IV SOLUTION) ×2 IMPLANT

## 2021-05-07 NOTE — Progress Notes (Signed)
ID Patient more awake But hypotensive and  back on pressors. Seen by surgeon and plan is to take her for surgery for the left leg infection.  On examination awake Respond to some questions appropriately Family at bedside BP (!) 97/53    Pulse 95    Temp 100 F (37.8 C) (Oral)    Resp (!) 24    Ht 5\' 5"  (1.651 m)    Wt 123.4 kg    SpO2 94%    BMI 45.27 kg/m   Chest bilateral air entry HS- irregular .  Rate better controlled than yesterday Abdomen soft Left leg swollen below the knee Blisters Tender to touch No crepitus      Labs CBC Latest Ref Rng & Units 05/07/2021 05/06/2021 05/05/2021  WBC 4.0 - 10.5 K/uL 21.9(H) 16.2(H) 17.6(H)  Hemoglobin 12.0 - 15.0 g/dL 9.7(L) 10.5(L) 10.8(L)  Hematocrit 36.0 - 46.0 % 29.9(L) 33.4(L) 35.7(L)  Platelets 150 - 400 K/uL 294 240 228    CMP Latest Ref Rng & Units 05/07/2021 05/06/2021 05/05/2021  Glucose 70 - 99 mg/dL 148(H) 189(H) 166(H)  BUN 8 - 23 mg/dL 69(H) 60(H) 54(H)  Creatinine 0.44 - 1.00 mg/dL 3.32(H) 3.46(H) 3.43(H)  Sodium 135 - 145 mmol/L 134(L) 138 139  Potassium 3.5 - 5.1 mmol/L 3.4(L) 4.2 3.8  Chloride 98 - 111 mmol/L 98 104 105  CO2 22 - 32 mmol/L 22 23 18(L)  Calcium 8.9 - 10.3 mg/dL 7.0(L) 7.4(L) 7.7(L)  Total Protein 6.5 - 8.1 g/dL - - -  Total Bilirubin 0.3 - 1.2 mg/dL - - -  Alkaline Phos 38 - 126 U/L - - -  AST 15 - 41 U/L - - -  ALT 0 - 44 U/L - - -     Micro 05/04/2021 blood culture group A streptococcus  Imaging  Diffuse subcutaneous edema may represent cellulitis. No drainable fluid collection/abscess. No soft tissue gas.   Impression/recommendation Streptococcus pyogenes bacteremia with streptococcal toxic shock syndrome with left leg cellulitis with subcutaneous edema and blistering.  Concern for necrotizing fasciitis but no crepitus. Patient seen by surgeon.  With undergoing surgery.  Agree with that because of worsening leukocytosis. Patient is currently on IV penicillin and clindamycin.  We will send  repeat blood culture   AKI  New onset A. fib on amiodarone  COPD  OSA  Diabetes mellitus on insulin  Anemia  Discussed with daughter and husband at bedside.  Discussed with hospitalist.

## 2021-05-07 NOTE — TOC Initial Note (Signed)
Transition of Care [facility]) - Initial/Assessment Note    Patient Details  Name: Alexandra Foster MRN: [id number] Date of Birth: 1950/07/15  Transition of Care [facility]) CM/SW Contact:    Shelbie Hutching, RN Phone Number: 05/07/2021, 3:50 PM  Clinical Narrative:                 Patient admitted to the hospital with severe sepsis.  RNCM met with patient and patient's daughter at the bedside, daughter Alexandra Foster.  Patient is from home with her husband, independent at home, drives.  Daughter reports that patient is very active.  Patient is current with her PCP Dr. Ancil Boozer and gets prescriptions from [facility] on [address].   [name] reports that patient would likely be open to SNF for rehab if recommended and or home health services.  TOC will cont to follow.   Expected Discharge Plan: Skilled Nursing Facility Barriers to Discharge: Continued Medical Work up   Patient Goals and CMS Choice Patient states their goals for this hospitalization and ongoing recovery are:: patient lethargic but daughter at the bedside and hopes for a full recovery and for patient to get back home CMS Medicare.gov Compare Post Acute Care list provided to:: Patient Choice offered to / list presented to : Patient  Expected Discharge Plan and Services Expected Discharge Plan: [place]   Discharge Planning Services: CM Consult   Living arrangements for the past 2 months: Single Family Home                                      Prior Living Arrangements/Services Living arrangements for the past 2 months: Single Family Home Lives with:: Spouse Patient language and need for interpreter reviewed:: Yes Do you feel safe going back to the place where you live?: Yes      Need for Family Participation in Patient Care: Yes (Comment) Care giver support system in place?: Yes (comment)   Criminal Activity/Legal Involvement Pertinent to Current Situation/Hospitalization: No - Comment as  needed  Activities of Daily Living Home Assistive Devices/Equipment: None ADL Screening (condition at time of admission) Patient's cognitive ability adequate to safely complete daily activities?: Yes Is the patient deaf or have difficulty hearing?: No Does the patient have difficulty seeing, even when wearing glasses/contacts?: No Does the patient have difficulty concentrating, remembering, or making decisions?: No Patient able to express need for assistance with ADLs?: Yes Does the patient have difficulty dressing or bathing?: Yes Independently performs ADLs?: No Communication: Independent Dressing (OT): Needs assistance Is this a change from baseline?: Change from baseline, expected to last >3 days Grooming: Needs assistance Is this a change from baseline?: Change from baseline, expected to last >3 days Feeding: Independent Bathing: Needs assistance Is this a change from baseline?: Change from baseline, expected to last >3 days Toileting: Needs assistance Is this a change from baseline?: Change from baseline, expected to last >3days In/Out Bed: Dependent Is this a change from baseline?: Change from baseline, expected to last >3 days Walks in Home: Needs assistance Is this a change from baseline?: Change from baseline, expected to last >3 days Does the patient have difficulty walking or climbing stairs?: Yes Weakness of Legs: Left Weakness of Arms/Hands: None  Permission Sought/Granted Permission sought to share information with : Case Manager, Family Supports, Other (comment) Permission granted to share information with : Yes, Verbal Permission Granted  Share Information with NAME: Trilby Drummer  Bender     Permission granted to share info w Relationship: spouse  Permission granted to share info w Contact Information: [phone]  Emotional Assessment Appearance:: Appears stated age Attitude/Demeanor/Rapport: Lethargic Affect (typically observed): Accepting Orientation: :  Oriented to Self, Oriented to Place, Oriented to  Time, Oriented to Situation Alcohol / Substance Use: Not Applicable Psych Involvement: No (comment)  Admission diagnosis:  AKI (acute kidney injury) ([place]) [N17.9] Left leg cellulitis [W54.627] Severe sepsis ([place]) [A41.9, R65.20] Severe sepsis with lactic acidosis ([place]) [A41.9, R65.20, E87.20] Patient Active Problem List   Diagnosis Date Noted   Severe sepsis with lactic acidosis ([place]) 05/04/2021   Personal history of COVID-19 07/14/2019   Bilateral lower extremity edema 05/03/2018   Lower extremity pain, bilateral 05/03/2018   Diabetes mellitus type 2 in obese ([place]) 04/23/2018   Osteoarthritis of both knees 01/14/2018   Urge incontinence 03/14/2016   Osteopenia 02/13/2016   OSA (obstructive sleep apnea) 01/08/2016   Allergic rhinitis 04/13/2015   COPD (chronic obstructive pulmonary disease) ([place]) 09/18/2014   Morbid obesity ([place]) 09/18/2014   Esophagitis, reflux 09/18/2014   Essential hypertension 09/21/2006   PCP:  Steele Sizer, MD Pharmacy:   [facility] [place] [place] [address] Phone: [phone] Fax: [phone]  [facility] [address] [place] [address] Phone: [phone] Fax: [phone]     Social Determinants of Health (SDOH) Interventions    Readmission Risk Interventions No flowsheet data found.

## 2021-05-07 NOTE — Procedures (Signed)
Central Venous Catheter Insertion Procedure Note  Alexandra Foster  [id number]  1950/06/21  Date:05/07/21  Time:8:20 PM   Provider Performing:Bentlee Drier A [place]   Procedure: Insertion of Non-tunneled Central Venous [phone]) with US guidance ([id number])   Indication(s) Medication administration and Difficult access  Consent Risks of the procedure as well as the alternatives and risks of each were explained to the patient and/or caregiver.  Consent for the procedure was obtained and is signed in the bedside chart  Anesthesia Topical only with 1% lidocaine   Timeout Verified patient identification, verified procedure, site/side was marked, verified correct patient position, special equipment/implants available, medications/allergies/relevant history reviewed, required imaging and test results available.  Sterile Technique Maximal sterile technique including full sterile barrier drape, hand hygiene, sterile gown, sterile gloves, mask, hair covering, sterile ultrasound probe cover (if used).  Procedure Description Area of catheter insertion was cleaned with chlorhexidine and draped in sterile fashion.  With real-time ultrasound guidance a central venous catheter was placed into the right internal jugular vein. Nonpulsatile blood flow and easy flushing noted in all ports.  The catheter was sutured in place and sterile dressing applied.  Complications/Tolerance None; patient tolerated the procedure well. Chest X-ray is ordered to verify placement for internal jugular or subclavian cannulation.   Chest x-ray is not ordered for femoral cannulation.  EBL Minimal  Specimen(s) None   Rufina Falco, DNP, CCRN, FNP-C, AGACNP-BC Acute Care Nurse Practitioner  [facility] Pager: [phone] [place] [facility]

## 2021-05-07 NOTE — Anesthesia Procedure Notes (Signed)
Procedure Name: Intubation Date/Time: 05/07/2021 8:52 PM Performed by: Esaw Grandchild, CRNA Pre-anesthesia Checklist: Patient identified, Emergency Drugs available, Suction available and Patient being monitored Patient Re-evaluated:Patient Re-evaluated prior to induction Oxygen Delivery Method: Circle system utilized Preoxygenation: Pre-oxygenation with 100% oxygen Induction Type: IV induction and Rapid sequence Laryngoscope Size: McGraph and 3 Grade View: Grade I Tube type: Oral Tube size: 7.5 mm Number of attempts: 1 Airway Equipment and Method: Stylet Placement Confirmation: ETT inserted through vocal cords under direct vision, positive ETCO2 and breath sounds checked- equal and bilateral Secured at: 21 cm Tube secured with: Tape Dental Injury: Teeth and Oropharynx as per pre-operative assessment  Comments: No mask ventilation attempted. Patient with actively bleeding lip prior to induction of anesthesia; she said her lips were very dry.

## 2021-05-07 NOTE — Transfer of Care (Signed)
Immediate Anesthesia Transfer of Care Note  Patient: Alexandra Foster  Procedure(s) Performed: DEBRIDEMENT WOUND (Left: Leg Lower)  Patient Location: ICU  Anesthesia Type:General  Level of Consciousness: Patient remains intubated per anesthesia plan  Airway & Oxygen Therapy: Patient Spontanous Breathing, Patient remains intubated per anesthesia plan and Patient placed on Ventilator (see vital sign flow sheet for setting)  Post-op Assessment: Report given to RN and Post -op Vital signs reviewed and stable  Post vital signs: Reviewed and stable  Last Vitals:  Vitals Value Taken Time  BP 101/58 05/07/21 2250  Temp    Pulse 102 05/07/21 2255  Resp 18 05/07/21 2255  SpO2 94 % 05/07/21 2255  Vitals shown include unvalidated device data.  Last Pain:  Vitals:   05/07/21 2000  TempSrc: Oral  PainSc: 0-No pain         Complications: No notable events documented.

## 2021-05-07 NOTE — Consult Note (Signed)
ANTICOAGULATION CONSULT NOTE  Pharmacy Consult for heparin Indication: chest pain/ACS  No Known Allergies  Patient Measurements: Height: 5\' 5"  (165.1 cm) Weight: 123.4 kg (272 lb 0.8 oz) IBW/kg (Calculated) : 57 Heparin Dosing Weight: 86.1 kg  Vital Signs: Temp: 99.9 F (37.7 C) (01/23 1900) Temp Source: Oral (01/23 1900) BP: 85/60 (01/24 0100) Pulse Rate: 148 (01/24 0100)  Labs: Recent Labs    05/04/21 1943 05/05/21 0027 05/05/21 0454 05/05/21 1737 05/06/21 0325 05/07/21 0007  HGB 12.5  --  10.8*  --  10.5*  --   HCT 40.8  --  35.7*  --  33.4*  --   PLT 282  --  228  --  240  --   APTT  --  36   33  --   --   --   --   LABPROT  --  16.1*  --   --   --   --   INR  --  1.3*  --   --   --   --   HEPARINUNFRC  --   --   --   --   --  0.45  CREATININE 3.47*  --  3.30* 3.43* 3.46*  --      Estimated Creatinine Clearance: 19.4 mL/min (A) (by C-G formula based on SCr of 3.46 mg/dL (H)).   Medical History: Past Medical History:  Diagnosis Date   AKI (acute kidney injury) ([place])    a. 04/2021 in setting of bacteremia/shock.   Arthritis    Asthma    Bacteremia    a. 04/2021 S pyogenes bacteremia in setting of lower ext cellulitis.   COPD (chronic obstructive pulmonary disease) (HCC)    Diabetes mellitus without complication (HCC)    Endometriosis    GERD (gastroesophageal reflux disease)    History of echocardiogram    a. 07/2013 Echo: EF 55-60%, impaired relaxation, mild TR; b. 04/2021 Echo: EF 50-55%, mild LVH, nl RV fxn, mild BAE, Ao sclerosis w/o stenosis.   Hypertension    Obesity    PAF (paroxysmal atrial fibrillation) ([place])    a. 04/2021 in setting of septic shock/cellulitis.   Sleep apnea    CPAP    Medications:  Medications Prior to Admission  Medication Sig Dispense Refill Last Dose   albuterol (PROVENTIL) (2.5 MG/3ML) 0.083% nebulizer solution Take 3 mLs (2.5 mg total) by nebulization every 6 (six) hours as needed for wheezing or shortness of breath.  150 mL 1 Past Month   aspirin 81 MG tablet Take 1 tablet (81 mg total) by mouth daily. 30 tablet 0 Past Month   atorvastatin (LIPITOR) 40 MG tablet Take 1 tablet (40 mg total) by mouth daily. 90 tablet 1 Past Month   azelastine (ASTELIN) 0.1 % nasal spray Place 2 sprays into both nostrils 2 (two) times daily as needed for rhinitis or allergies. 30 mL 1 Past Month   baclofen (LIORESAL) 10 MG tablet Take 10 mg by mouth daily.   Past Month   celecoxib (CELEBREX) 200 MG capsule Take 200 mg by mouth daily.   Past Month   Cetirizine HCl (ZYRTEC ALLERGY) 10 MG TBDP Take 10 mg by mouth at bedtime. For sinus symptoms and seasonal allergies 30 tablet 1 Past Month   docusate sodium (COLACE) 100 MG capsule Take 100 mg by mouth daily.   Past Month   fluticasone (FLONASE) 50 MCG/ACT nasal spray    Past Month   glucose blood (ACCU-CHEK AVIVA PLUS) test strip Use as  instructed 100 each 12 Past Month   Lancets (ACCU-CHEK SOFT TOUCH) lancets Use as instructed 100 each 12 Past Month   meloxicam (MOBIC) 15 MG tablet Take 15 mg by mouth daily.   Past Month   metFORMIN (GLUCOPHAGE-XR) 750 MG 24 hr tablet Take 1 tablet (750 mg total) by mouth daily with breakfast. 90 tablet 1 Past Month   montelukast (SINGULAIR) 10 MG tablet Take 1 tablet (10 mg total) by mouth at bedtime. 90 tablet 1 Past Month   Multiple Vitamins-Minerals (MULTIVITAL PO) Take 1 tablet by mouth daily.   Past Month   omeprazole (PRILOSEC) 20 MG capsule Take 1 capsule (20 mg total) by mouth daily. 90 capsule 1 Past Month   ORTHOVISC 30 MG/2ML SOSY    Past Month   triamterene-hydrochlorothiazide (DYAZIDE) 37.5-25 MG capsule TAKE 1 CAPSULE EVERY DAY 90 capsule 1 Past Month   vitamin B-12 (CYANOCOBALAMIN) 100 MCG tablet Take 100 mcg by mouth daily.   Past Month   calcium carbonate (OSCAL) 1500 (600 Ca) MG TABS tablet Take 600 mg of elemental calcium by mouth 2 (two) times daily with a meal. (Patient not taking: Reported on 05/05/2021)   Not Taking    Fluticasone-Umeclidin-Vilant (TRELEGY ELLIPTA) 100-62.5-25 MCG/INH AEPB Inhale 1 puff into the lungs daily. In place of Stiolto 180 each 1 PRN at PRN   loratadine (CLARITIN) 10 MG tablet Take 1 tablet (10 mg total) by mouth daily. 90 tablet 1 PRN at PRN   Scheduled:   aspirin EC  81 mg Oral Daily   atorvastatin  40 mg Oral Daily   Chlorhexidine Gluconate Cloth  6 each Topical Q0600   insulin aspart  0-5 Units Subcutaneous QHS   insulin aspart  0-9 Units Subcutaneous TID WC   midodrine  10 mg Oral TID WC   mometasone-formoterol  2 puff Inhalation BID   montelukast  10 mg Oral QHS   pantoprazole  40 mg Oral Daily   umeclidinium bromide  1 puff Inhalation Daily   Infusions:   sodium chloride Stopped (05/05/21 0816)   amiodarone 30 mg/hr (05/06/21 1350)   clindamycin (CLEOCIN) IV 900 mg (05/06/21 2127)   heparin 1,200 Units/hr (05/06/21 1817)   pencillin G potassium IV 4 Million Units (05/06/21 1825)   phenylephrine (NEO-SYNEPHRINE) Adult infusion     PRN: docusate sodium, oxyCODONE, polyethylene glycol Anti-infectives (From admission, onward)    Start     Dose/Rate Route Frequency Ordered Stop   05/06/21 2200  clindamycin (CLEOCIN) IVPB 900 mg        900 mg 100 mL/hr over 30 Minutes Intravenous Every 8 hours 05/06/21 1519     05/06/21 0030  penicillin G potassium 4 Million Units in dextrose 5 % 250 mL IVPB        4 Million Units 250 mL/hr over 60 Minutes Intravenous Every 8 hours 05/05/21 1023     05/05/21 1400  clindamycin (CLEOCIN) IVPB 600 mg  Status:  Discontinued        600 mg 100 mL/hr over 30 Minutes Intravenous Every 8 hours 05/05/21 1010 05/06/21 1519   05/05/21 1200  penicillin G potassium 4 Million Units in dextrose 5 % 250 mL IVPB  Status:  Discontinued        4 Million Units 250 mL/hr over 60 Minutes Intravenous Every 4 hours 05/05/21 1018 05/05/21 1024   05/04/21 2345  ceFEPIme (MAXIPIME) 2 g in sodium chloride 0.9 % 100 mL IVPB  Status:  Discontinued  2  g 200 mL/hr over 30 Minutes Intravenous Every 24 hours 05/04/21 2328 05/05/21 1012   05/04/21 2345  vancomycin (VANCOREADY) IVPB 1500 mg/300 mL        1,500 mg 150 mL/hr over 120 Minutes Intravenous  Once 05/04/21 2337 05/05/21 0349   05/04/21 2338  vancomycin variable dose per unstable renal function (pharmacist dosing)  Status:  Discontinued         Does not apply See admin instructions 05/04/21 2338 05/05/21 1030   05/04/21 2330  vancomycin (VANCOCIN) IVPB 1000 mg/200 mL premix  Status:  Discontinued        1,000 mg 200 mL/hr over 60 Minutes Intravenous  Once 05/04/21 2328 05/04/21 2336   05/04/21 2045  vancomycin (VANCOCIN) IVPB 1000 mg/200 mL premix        1,000 mg 200 mL/hr over 60 Minutes Intravenous  Once 05/04/21 2040 05/04/21 2243   05/04/21 2045  cefTRIAXone (ROCEPHIN) 2 g in sodium chloride 0.9 % 100 mL IVPB        2 g 200 mL/hr over 30 Minutes Intravenous  Once 05/04/21 2040 05/04/21 2206       Assessment: Pharmacy consulted to heparin for ACS. No trops ordered. ECG with no acute ST/T changes per note. Pt does have new onset afib. CHA2DS2VASc 4.   Goal of Therapy:  Heparin level 0.3-0.7 units/ml Monitor platelets by anticoagulation protocol: Yes  0124 0007 HL 0.45, therapeutic x 1   Plan:  Continue heparin infusion at 1200 units/hr Recheck HL in 8 hr to confirm CBC daily while on heparin  Renda Rolls, PharmD, [facility] 05/07/2021 1:42 AM

## 2021-05-07 NOTE — Consult Note (Signed)
PHARMACY CONSULT NOTE - FOLLOW UP  Pharmacy Consult for Electrolyte Monitoring and Replacement   Recent Labs: Potassium (mmol/L)  Date Value  05/06/2021 4.2  08/11/2013 3.3 (L)   Magnesium (mg/dL)  Date Value  05/06/2021 2.4   Calcium (mg/dL)  Date Value  05/06/2021 7.4 (L)   Calcium, Total (mg/dL)  Date Value  08/11/2013 9.2   Albumin (g/dL)  Date Value  05/04/2021 2.8 (L)  08/11/2013 3.1 (L)   Phosphorus (mg/dL)  Date Value  05/06/2021 4.4   Sodium (mmol/L)  Date Value  05/06/2021 138  08/11/2013 138     Assessment: Pharmacy has been consulted to monitor and replace electrolytes in 71yo patient with severe sepsis and strep pyogenes bacteremia.   K: 4.2>3.4 Phos: 4.4>4.5 Mg: 2.4>2.5  Goal of Therapy:  Electrolytes WNL  Plan:  K: 4.2>3.4: Replete with 88meq PO x1 based on prior response. No additional repletion warranted at this time. Will f/u electrolytes with AM labs  Lorna Dibble, PharmD, [facility] Clinical Pharmacist 05/07/2021 9:24 AM

## 2021-05-07 NOTE — Progress Notes (Signed)
NAME:  Alexandra Foster, MRN:  [id number], DOB:  02/04/1951, LOS: 3 ADMISSION DATE:  05/04/2021   BRIEF SYNOPSIS 71 y.o female with medical history significant of Asthma/COPD, T2DM, HTN, OSA on CPAP, GERD, COVID-19 pneumonia, and Bilateral lower extremity edema who presented to the ED with chief complaints of LLE pain and chills since Thursday.   Patient given 30 cc/kg of fluids and started on broad-spectrum antibiotics for sepsis with septic shock. Patient remained hypotensive despite IVF boluses therefore was started on Levophed. PCCM consulted.   Past Medical History     Arthritis   Asthma   COPD (chronic obstructive pulmonary disease) ([place])   Diabetes mellitus without complication ([place])   Endometriosis   GERD (gastroesophageal reflux disease)   Hypertension   Obesity   Sleep apnea    CPAP    Significant Hospital Events   05/04/21: Admitted to the ICU with severe sepsis with shock secondary to cellulitis of LLE  1/23 off pressors +Afib with RVR 1/24 blood pressure low but cuff size not big enough, inaccurate readings   Significant Diagnostic Tests:  1/21: Chest Xray> no active cardiopulmonary process 1/21: CT left lower leg>Diffuse subcutaneous edema may represent cellulitis. No drainable fluid collection/abscess 1/21: Ultrasound lower unilateral left> no DVT   Micro Data:  1/21: SARS-CoV-2 PCR> negative 1/21: Influenza PCR> negative 1/21: Blood culture x2> 1/21: Urine Culture> 1/21: MRSA PCR>>    Antimicrobials:  Vancomycin 1/21> Cefepime 1/21> Ceftriaxone 1/21 X1 Micro Data:  1/15 COVID NEG 1/21 strep pyogenes bacteremia      Interim History / Subjective:  Alert and awake Back to Sinus rhythm No major complaints at this time Admitted for severe sepsis Started on pressors but BP cuff is inaccurate      Objective   Blood pressure 91/64, pulse 97, temperature 99.7 F (37.6 C), temperature source Axillary, resp. rate 18, height 5\' 5"  (1.651 m),  weight 123.4 kg, SpO2 91 %.        Intake/Output Summary (Last 24 hours) at 05/07/2021 0733 Last data filed at 05/07/2021 0533 Gross per 24 hour  Intake 660 ml  Output 1500 ml  Net -840 ml    Filed Weights   05/04/21 1941 05/05/21 1100  Weight: 120.7 kg 123.4 kg    ROS +pain No chest pain no SOB ALL OTHER ROS NEGATIVE   Physical Examination:   General Appearance: No distress  EYES PERRLA, EOM intact.   NECK Supple, No JVD Pulmonary: normal breath sounds, No wheezing.  CardiovascularNormal S1,S2.  No m/r/g.   Abdomen: Benign, Soft, non-tender. Skin:   warm, no rashes, no ecchymosis  Extremities: left leg with blister Neuro:without focal findings,  speech normal  PSYCHIATRIC: Mood, affect within normal limits.       Labs/imaging that I havepersonally reviewed  (right click and "Reselect all SmartList Selections" daily)      ASSESSMENT AND PLAN SYNOPSIS  Admitted for Severe Sepsis with shock  Secondary to Cellulitis of left lower leg strep pyogenes bacteremia with acute renla failure and new onset afib with RVR  SEPTIC shock SOURCE-left leg -use vasopressors to keep MAP>65 as needed -follow ABG and LA as needed -stress dose steroids Midodrine 10 mg tid   CARDIAC new onset afib wth RVR ICU monitoring  amio infusion and heparin infusion  ACUTE KIDNEY INJURY/Renal Failure -continue Foley Catheter-assess need -Avoid nephrotoxic agents -Follow urine output, BMP -Ensure adequate renal perfusion, optimize oxygenation -Renal dose medications   Intake/Output Summary (Last 24 hours) at 05/07/2021 0736  Last data filed at 05/07/2021 0533 Gross per 24 hour  Intake 660 ml  Output 1500 ml  Net -840 ml   BMP Latest Ref Rng & Units 05/06/2021 05/05/2021 05/05/2021  Glucose 70 - 99 mg/dL 189(H) 166(H) 114(H)  BUN 8 - 23 mg/dL 60(H) 54(H) 48(H)  Creatinine 0.44 - 1.00 mg/dL 3.46(H) 3.43(H) 3.30(H)  BUN/Creat Ratio 6 - 22 (calc) - - -  Sodium 135 - 145 mmol/L 138  139 140  Potassium 3.5 - 5.1 mmol/L 4.2 3.8 3.3(L)  Chloride 98 - 111 mmol/L 104 105 106  CO2 22 - 32 mmol/L 23 18(L) 21(L)  Calcium 8.9 - 10.3 mg/dL 7.4(L) 7.7(L) 7.8(L)     INFECTIOUS DISEASE -continue antibiotics as prescribed -follow up cultures -follow up ID consultation    ENDO - ICU hypoglycemic\Hyperglycemia protocol -check FSBS per protocol   GI GI PROPHYLAXIS as indicated  NUTRITIONAL STATUS DIET--> as tolerated Constipation protocol as indicated   ELECTROLYTES -follow labs as needed -replace as needed -pharmacy consultation and following  ACUTE ANEMIA- TRANSFUSE AS NEEDED CONSIDER TRANSFUSION  IF HGB<7 DVT PRX with TED/SCD's ONLY     Best practice:  Diet:  Oral Pain/Anxiety/Delirium protocol (if indicated): No VAP protocol (if indicated): Not indicated DVT prophylaxis: Subcutaneous Heparin GI prophylaxis: PPI Glucose control:  SSI Yes Central venous access:  N/A Arterial line:  N/A Foley:  N/A Mobility:  bed rest  PT consulted: N/A Last date of multidisciplinary goals of care discussion [1/21] Code Status:  full code Disposition: ICU   Labs   CBC: Recent Labs  Lab 05/04/21 1943 05/05/21 0454 05/06/21 0325  WBC 16.5* 17.6* 16.2*  NEUTROABS 15.5*  --   --   HGB 12.5 10.8* 10.5*  HCT 40.8 35.7* 33.4*  MCV 78.5* 77.9* 75.9*  PLT 282 228 240     Basic Metabolic Panel: Recent Labs  Lab 05/04/21 1943 05/05/21 0454 05/05/21 1153 05/05/21 1737 05/06/21 0325  NA 141 140  --  139 138  K 2.9* 3.3*  --  3.8 4.2  CL 99 106  --  105 104  CO2 26 21*  --  18* 23  GLUCOSE 144* 114*  --  166* 189*  BUN 44* 48*  --  54* 60*  CREATININE 3.47* 3.30*  --  3.43* 3.46*  CALCIUM 8.7* 7.8*  --  7.7* 7.4*  MG  --  1.5* 2.2  --  2.4  PHOS  --  2.8 4.5  --  4.4    GFR: Estimated Creatinine Clearance: 19.4 mL/min (A) (by C-G formula based on SCr of 3.46 mg/dL (H)). Recent Labs  Lab 05/04/21 1943 05/04/21 2130 05/04/21 2150 05/05/21 0453  05/05/21 0454 05/05/21 1153 05/05/21 1737 05/05/21 2017 05/06/21 0325  PROCALCITON  --  35.76  --   --  40.92  --   --   --  66.16  WBC 16.5*  --   --   --  17.6*  --   --   --  16.2*  LATICACIDVEN 3.8*  --    < > 4.2*  --  3.7* 4.1* 2.9*  --    < > = values in this interval not displayed.     Liver Function Tests: Recent Labs  Lab 05/04/21 1943  AST 67*  ALT 48*  ALKPHOS 103  BILITOT 1.1  PROT 7.9  ALBUMIN 2.8*    Coagulation Profile: Recent Labs  Lab 05/05/21 0027  INR 1.3*     Cardiac Enzymes: No results for  input(s): CKTOTAL, CKMB, CKMBINDEX, TROPONINI in the last 168 hours.  HbA1C: Hgb A1c MFr Bld  Date/Time Value Ref Range Status  05/05/2021 04:53 AM 6.7 (H) 4.8 - 5.6 % Final    Comment:    (NOTE)         Prediabetes: 5.7 - 6.4         Diabetes: >6.4         Glycemic control for adults with diabetes: <7.0   02/11/2021 09:41 AM 6.3 (H) <5.7 % of total Hgb Final    Comment:    For someone without known diabetes, a hemoglobin  A1c value between 5.7% and 6.4% is consistent with prediabetes and should be confirmed with a  follow-up test. . For someone with known diabetes, a value <7% indicates that their diabetes is well controlled. A1c targets should be individualized based on duration of diabetes, age, comorbid conditions, and other considerations. . This assay result is consistent with an increased risk of diabetes. . Currently, no consensus exists regarding use of hemoglobin A1c for diagnosis of diabetes for children. .     CBG: Recent Labs  Lab 05/05/21 2154 05/06/21 0725 05/06/21 1158 05/06/21 1555 05/06/21 2119  GLUCAP 166* 188* 203* 153* 145*    No Known Allergies   Home Medications  Prior to Admission medications   Medication Sig Start Date End Date Taking? Authorizing Provider  albuterol (PROVENTIL) (2.5 MG/3ML) 0.083% nebulizer solution Take 3 mLs (2.5 mg total) by nebulization every 6 (six) hours as needed for wheezing  or shortness of breath. 10/03/19  Yes Sowles, Drue Stager, MD  aspirin 81 MG tablet Take 1 tablet (81 mg total) by mouth daily. 01/08/16  Yes Sowles, Drue Stager, MD  atorvastatin (LIPITOR) 40 MG tablet Take 1 tablet (40 mg total) by mouth daily. 02/11/21  Yes Sowles, Drue Stager, MD  azelastine (ASTELIN) 0.1 % nasal spray Place 2 sprays into both nostrils 2 (two) times daily as needed for rhinitis or allergies. 07/31/20  Yes Delsa Grana, PA-C  baclofen (LIORESAL) 10 MG tablet Take 10 mg by mouth daily. 04/16/21  Yes [provider]  celecoxib (CELEBREX) 200 MG capsule Take 200 mg by mouth daily. 04/03/21  Yes [provider]  Cetirizine HCl (ZYRTEC ALLERGY) 10 MG TBDP Take 10 mg by mouth at bedtime. For sinus symptoms and seasonal allergies 07/31/20  Yes Delsa Grana, PA-C  docusate sodium (COLACE) 100 MG capsule Take 100 mg by mouth daily.   Yes [provider]  fluticasone Asencion Islam) 50 MCG/ACT nasal spray  08/31/20  Yes [provider]  glucose blood (ACCU-CHEK AVIVA PLUS) test strip Use as instructed 12/08/17  Yes Sowles, Drue Stager, MD  Lancets (ACCU-CHEK SOFT TOUCH) lancets Use as instructed 12/08/17  Yes Sowles, Drue Stager, MD  meloxicam (MOBIC) 15 MG tablet Take 15 mg by mouth daily. 04/23/21  Yes [provider]  metFORMIN (GLUCOPHAGE-XR) 750 MG 24 hr tablet Take 1 tablet (750 mg total) by mouth daily with breakfast. 02/11/21  Yes Sowles, Drue Stager, MD  montelukast (SINGULAIR) 10 MG tablet Take 1 tablet (10 mg total) by mouth at bedtime. 02/11/21  Yes Sowles, Drue Stager, MD  Multiple Vitamins-Minerals (MULTIVITAL PO) Take 1 tablet by mouth daily.   Yes [provider]  omeprazole (PRILOSEC) 20 MG capsule Take 1 capsule (20 mg total) by mouth daily. 02/11/21  Yes Steele Sizer, MD  [place] 30 MG/2ML SOSY  10/09/20  Yes [provider]  triamterene-hydrochlorothiazide (DYAZIDE) 37.5-25 MG capsule TAKE 1 CAPSULE EVERY DAY 05/02/21  Yes  Steele Sizer, MD   vitamin B-12 (CYANOCOBALAMIN) 100 MCG tablet Take 100 mcg by mouth daily.   Yes [provider]  calcium carbonate (OSCAL) 1500 (600 Ca) MG TABS tablet Take 600 mg of elemental calcium by mouth 2 (two) times daily with a meal. Patient not taking: Reported on 05/05/2021    [provider]  Fluticasone-Umeclidin-Vilant (TRELEGY ELLIPTA) 100-62.5-25 MCG/INH AEPB Inhale 1 puff into the lungs daily. In place of Stiolto 02/10/20   Steele Sizer, MD  loratadine (CLARITIN) 10 MG tablet Take 1 tablet (10 mg total) by mouth daily. 02/11/21   Steele Sizer, MD        DVT/GI PRX  assessed I Assessed the need for Labs I Assessed the need for Foley I Assessed the need for Central Venous Line Family Discussion when available I Assessed the need for Mobilization I made an Assessment of medications to be adjusted accordingly Safety Risk assessment completed  CASE DISCUSSED IN MULTIDISCIPLINARY ROUNDS WITH ICU TEAM   Critical Care Time devoted to patient care services described in this note is 35 minutes.     Corrin Parker, M.D.  [facility]  Medical Director [place] Director [facility] Cardio-Pulmonary Department

## 2021-05-07 NOTE — Progress Notes (Signed)
Progress Note  Patient Name: Alexandra Foster Date of Encounter: 05/07/2021  Alexandra Foster Cardiologist: Kathlyn Sacramento, MD   Subjective   No specific complaints, general malaise, tired, weak Swelling and pain lower extremities worse on the left Family at the bedside, commenting on worsening blister on left lower extremity  On amiodarone infusion Converted to normal sinus rhythm 12:17 AM Normal sinus rhythm in the 90s on telemetry Remains on low-dose pressors for blood pressure support   Inpatient Medications    Scheduled Meds:  amiodarone  150 mg Intravenous Once   aspirin EC  81 mg Oral Daily   atorvastatin  40 mg Oral Daily   Chlorhexidine Gluconate Cloth  6 each Topical Q0600   insulin aspart  0-5 Units Subcutaneous QHS   insulin aspart  0-9 Units Subcutaneous TID WC   midodrine  10 mg Oral TID WC   mometasone-formoterol  2 puff Inhalation BID   montelukast  10 mg Oral QHS   [START ON 05/08/2021] multivitamin with minerals  1 tablet Oral Daily   pantoprazole  40 mg Oral Daily   Ensure Max Protein  11 oz Oral BID   umeclidinium bromide  1 puff Inhalation Daily   Continuous Infusions:  sodium chloride Stopped (05/05/21 0816)   amiodarone 30 mg/hr (05/07/21 1212)   clindamycin (CLEOCIN) IV 900 mg (05/07/21 0549)   heparin 1,200 Units/hr (05/07/21 1158)   pencillin G potassium IV 4 Million Units (05/07/21 0903)   phenylephrine (NEO-SYNEPHRINE) Adult infusion 60 mcg/min (05/07/21 1041)   PRN Meds: docusate sodium, oxyCODONE, polyethylene glycol   Vital Signs    Vitals:   05/07/21 0800 05/07/21 0900 05/07/21 1000 05/07/21 1100  BP: (!) 87/50 (!) 86/54 (!) 91/47 (!) 91/50  Pulse: 93 89 93 89  Resp: 19 19 20 20   Temp:      TempSrc:      SpO2: 95% 94% 94% 94%  Weight:      Height:        Intake/Output Summary (Last 24 hours) at 05/07/2021 1434 Last data filed at 05/07/2021 1100 Gross per 24 hour  Intake 180 ml  Output 700 ml  Net -520 ml   Last 3  Weights 05/05/2021 05/04/2021 04/30/2021  Weight (lbs) 272 lb 0.8 oz 266 lb 269 lb  Weight (kg) 123.4 kg 120.657 kg 122.018 kg      Telemetry    Normal sinus rhythm- Personally Reviewed  ECG     - Personally Reviewed  Physical Exam   GEN: No acute distress.  Obese Neck: No JVD Cardiac: RRR, no murmurs, rubs, or gallops.  Respiratory: Clear to auscultation bilaterally. GI: Soft, nontender, non-distended  MS: No edema; No deformity. Skin: Blistering left lower extremity Neuro:  Nonfocal  Psych: Normal affect   Labs    High Sensitivity Troponin:  No results for input(s): TROPONINIHS in the last 720 hours.   Chemistry Recent Labs  Lab 05/04/21 1943 05/05/21 0454 05/05/21 1153 05/05/21 1737 05/06/21 0325 05/07/21 0821  NA 141   < >  --  139 138 134*  K 2.9*   < >  --  3.8 4.2 3.4*  CL 99   < >  --  105 104 98  CO2 26   < >  --  18* 23 22  GLUCOSE 144*   < >  --  166* 189* 148*  BUN 44*   < >  --  54* 60* 69*  CREATININE 3.47*   < >  --  3.43* 3.46* 3.32*  CALCIUM 8.7*   < >  --  7.7* 7.4* 7.0*  MG  --    < > 2.2  --  2.4 2.5*  PROT 7.9  --   --   --   --   --   ALBUMIN 2.8*  --   --   --   --   --   AST 67*  --   --   --   --   --   ALT 48*  --   --   --   --   --   ALKPHOS 103  --   --   --   --   --   BILITOT 1.1  --   --   --   --   --   GFRNONAA 13*   < >  --  14* 13* 14*  ANIONGAP 16*   < >  --  16* 11 14   < > = values in this interval not displayed.    Lipids No results for input(s): CHOL, TRIG, HDL, LABVLDL, LDLCALC, CHOLHDL in the last 168 hours.  Hematology Recent Labs  Lab 05/05/21 0454 05/06/21 0325 05/07/21 0821  WBC 17.6* 16.2* 21.9*  RBC 4.58 4.40 4.08  HGB 10.8* 10.5* 9.7*  HCT 35.7* 33.4* 29.9*  MCV 77.9* 75.9* 73.3*  MCH 23.6* 23.9* 23.8*  MCHC 30.3 31.4 32.4  RDW 14.8 14.8 14.8  PLT 228 240 294   Thyroid No results for input(s): TSH, FREET4 in the last 168 hours.  BNPNo results for input(s): BNP, PROBNP in the last 168 hours.   DDimer  Recent Labs  Lab 05/05/21 0027  DDIMER 2.22*     Radiology    ECHOCARDIOGRAM COMPLETE BUBBLE STUDY  Result Date: 05/06/2021    ECHOCARDIOGRAM REPORT   Patient Name:   Alexandra Foster Date of Exam: 05/06/2021 Medical Rec #:  [id number]           Height:       65.0 in Accession #:    [id number]          Weight:       272.0 lb Date of Birth:  1950/09/15           BSA:          2.254 m Patient Age:    71 years            BP:           93/69 mmHg Patient Gender: F                   HR:           139 bpm. Exam Location:  [facility] Procedure: 2D Echo, Cardiac Doppler, Color Doppler and Saline Contrast Bubble            Study Indications:     Bacteremia 790.7  History:         Patient has no prior history of Echocardiogram examinations.                  COPD; Risk Factors:Hypertension and Diabetes.  Sonographer:     Sherrie Sport Referring Phys:  [id number] ADAM ROSS SCHERTZ Diagnosing Phys: Kathlyn Sacramento MD  Sonographer Comments: Suboptimal parasternal window and suboptimal apical window. Image acquisition challenging due to COPD. IMPRESSIONS  1. Left ventricular ejection fraction, by estimation, is 50 to 55%. The left ventricle has low normal function. Left ventricular endocardial border not  optimally defined to evaluate regional wall motion. There is mild left ventricular hypertrophy. Left ventricular diastolic parameters are indeterminate.  2. Right ventricular systolic function is normal. The right ventricular size is normal. Tricuspid regurgitation signal is inadequate for assessing PA pressure.  3. Left atrial size was mildly dilated.  4. Right atrial size was mildly dilated.  5. The mitral valve is normal in structure. No evidence of mitral valve regurgitation. No evidence of mitral stenosis.  6. The aortic valve is normal in structure. Aortic valve regurgitation is not visualized. Aortic valve sclerosis/calcification is present, without any evidence of aortic stenosis.  7. challenging image quality.  FINDINGS  Left Ventricle: Left ventricular ejection fraction, by estimation, is 50 to 55%. The left ventricle has low normal function. Left ventricular endocardial border not optimally defined to evaluate regional wall motion. The left ventricular internal cavity  size was normal in size. There is mild left ventricular hypertrophy. Left ventricular diastolic parameters are indeterminate. Right Ventricle: The right ventricular size is normal. No increase in right ventricular wall thickness. Right ventricular systolic function is normal. Tricuspid regurgitation signal is inadequate for assessing PA pressure. Left Atrium: Left atrial size was mildly dilated. Right Atrium: Right atrial size was mildly dilated. Pericardium: There is no evidence of pericardial effusion. Mitral Valve: The mitral valve is normal in structure. No evidence of mitral valve regurgitation. No evidence of mitral valve stenosis. Tricuspid Valve: The tricuspid valve is normal in structure. Tricuspid valve regurgitation is not demonstrated. No evidence of tricuspid stenosis. Aortic Valve: The aortic valve is normal in structure. Aortic valve regurgitation is not visualized. Aortic valve sclerosis/calcification is present, without any evidence of aortic stenosis. Aortic valve mean gradient measures 5.0 mmHg. Aortic valve peak  gradient measures 8.9 mmHg. Aortic valve area, by VTI measures 2.75 cm. Pulmonic Valve: The pulmonic valve was normal in structure. Pulmonic valve regurgitation is not visualized. No evidence of pulmonic stenosis. Aorta: The aortic root is normal in size and structure. Venous: The inferior vena cava was not well visualized. IAS/Shunts: No atrial level shunt detected by color flow Doppler. Agitated saline contrast was given intravenously to evaluate for intracardiac shunting.  LEFT VENTRICLE PLAX 2D LVIDd:         2.80 cm LVIDs:         2.10 cm LV PW:         1.10 cm LV IVS:        1.05 cm LVOT diam:     2.00 cm LV SV:          45 LV SV Index:   20 LVOT Area:     3.14 cm  RIGHT VENTRICLE RV Basal diam:  4.00 cm RV S prime:     18.70 cm/s LEFT ATRIUM             Index        RIGHT ATRIUM           Index LA diam:        3.00 cm 1.33 cm/m   RA Area:     20.20 cm LA Vol (A2C):   56.4 ml 25.02 ml/m  RA Volume:   61.90 ml  27.46 ml/m LA Vol (A4C):   51.0 ml 22.62 ml/m LA Biplane Vol: 53.5 ml 23.73 ml/m  AORTIC VALVE AV Area (Vmax):    2.40 cm AV Area (Vmean):   2.31 cm AV Area (VTI):     2.75 cm AV Vmax:  149.00 cm/s AV Vmean:          107.000 cm/s AV VTI:            0.164 m AV Peak Grad:      8.9 mmHg AV Mean Grad:      5.0 mmHg LVOT Vmax:         114.00 cm/s LVOT Vmean:        78.700 cm/s LVOT VTI:          0.144 m LVOT/AV VTI ratio: 0.88  AORTA Ao Root diam: 2.83 cm MITRAL VALVE                TRICUSPID VALVE MV Area (PHT): 6.07 cm     TR Peak grad:   13.4 mmHg MV Decel Time: 125 msec     TR Vmax:        183.00 cm/s MV E velocity: 111.00 cm/s                             SHUNTS                             Systemic VTI:  0.14 m                             Systemic Diam: 2.00 cm Kathlyn Sacramento MD Electronically signed by Kathlyn Sacramento MD Signature Date/Time: 05/06/2021/1:25:12 PM    Final     Cardiac Studies   Echo  1. Left ventricular ejection fraction, by estimation, is 50 to 55%. The  left ventricle has low normal function. Left ventricular endocardial  border not optimally defined to evaluate regional wall motion. There is  mild left ventricular hypertrophy. Left  ventricular diastolic parameters are indeterminate.   2. Right ventricular systolic function is normal. The right ventricular  size is normal. Tricuspid regurgitation signal is inadequate for assessing  PA pressure.   3. Left atrial size was mildly dilated.   4. Right atrial size was mildly dilated.   5. The mitral valve is normal in structure. No evidence of mitral valve  regurgitation. No evidence of mitral stenosis.   6. The aortic valve is  normal in structure. Aortic valve regurgitation is  not visualized. Aortic valve sclerosis/calcification is present, without  any evidence of aortic stenosis.   7. challenging image quality.   Patient Profile  Alexandra Foster is a 71 y.o. female with a history of asthma/COPD, HTN, DMII, obesity, OSA on CPAP, GERD, and COVID-19 pneumonia, who is being seen today for the evaluation of afib w/ RVR in the setting of admission for lower extremity cellulitis, S pyogenes bacteremia, shock, and AKI   Assessment & Plan    1.  Afib w/ RVR:   Presenting morning of May 06, 2021 in the setting of sepsis Started on IV amiodarone, converting to normal sinus rhythm 2:17 AM overnight -Echo with low normal ejection fraction and atrial fibrillation -Would continue amiodarone infusion, high risk of recurrent arrhythmia in the setting of sepsis -On heparin infusion CHA2DS2VASc = 4.  (May need to hold heparin given worsening anemia 12.5 down to 9.7 today) -Given low blood pressure, not on ? blocker/dilt, -Not a candidate for digoxin in the setting of renal dysfunction  High risk of recurrent arrhythmia   2.  Septic shock/S pyogenes bacteremia/L lower extremity cellulitis:  Continued hypotension, maintained on low-dose pressors  on antibiotics ID following Worsening blistering on left lower extremity  3.  AKI:/ATN Creat nl in 01/2021.   Most recent measurement creatinine 3.3 BUN 69   4.  DMII:   per IM/CCM.   5.  Normocytic anemia:   H/H drifting down since admission.   12.5, 10.8, 10.5 most recently 9.7 WBC trending upwards, platelets holding steady   Long discussion with family at the bedside,    Total encounter time more than 50 minutes  Greater than 50% was spent in counseling and coordination of care with the patient   For questions or updates, please contact [place] Please consult [email or web address] for contact info under        Signed, Ida Rogue, MD  05/07/2021,  2:34 PM

## 2021-05-07 NOTE — Consult Note (Signed)
SURGICAL CONSULTATION NOTE   HISTORY OF PRESENT ILLNESS (HPI):  71 y.o. female presented to [facility] ED for evaluation of left leg swelling.  Patient unable to give history due to lethargy.  Husband and daughter at bedside giving history.  As per husband there is no timing of when the swelling started.  She has chronic swelling of bilateral lower extremity.  She did start presenting chills and swelling when she came to the emergency room on 05/04/2021.  At that moment she was found with hypotension, tachycardia respiratory rate of 27 and leukocytosis.  CT of the lower extremity without drainable fluid collection or gas.  I personally evaluate the images.  Due to the sepsis picture she was started on broad-spectrum antibiotic and IV fluid resuscitation.  She was started on Levophed.  Blood cultures eventually showed strep pyogenes bacteremia.  In the last few days the swelling of the left leg has been worsening and now with bullae.  There is severe tenderness to palpation and severe edema and cellulitis.  I was consulted today by Dr. Mal Misty for evaluation of worsening left leg swelling.   PAST MEDICAL HISTORY (PMH):  Past Medical History:  Diagnosis Date   AKI (acute kidney injury) ([place])    a. 04/2021 in setting of bacteremia/shock.   Arthritis    Asthma    Bacteremia    a. 04/2021 S pyogenes bacteremia in setting of lower ext cellulitis.   COPD (chronic obstructive pulmonary disease) (HCC)    Diabetes mellitus without complication (HCC)    Endometriosis    GERD (gastroesophageal reflux disease)    History of echocardiogram    a. 07/2013 Echo: EF 55-60%, impaired relaxation, mild TR; b. 04/2021 Echo: EF 50-55%, mild LVH, nl RV fxn, mild BAE, Ao sclerosis w/o stenosis.   Hypertension    Obesity    PAF (paroxysmal atrial fibrillation) ([place])    a. 04/2021 in setting of septic shock/cellulitis.   Sleep apnea    CPAP     PAST SURGICAL HISTORY ([place]):  Past Surgical History:  Procedure Laterality  Date   ABDOMINAL HYSTERECTOMY     COLONOSCOPY  10/29/2006   Dr Allen Norris   COLONOSCOPY WITH PROPOFOL N/A 11/05/2016   Procedure: COLONOSCOPY WITH PROPOFOL;  Surgeon: Robert Bellow, MD;  Location: [facility] ENDOSCOPY;  Service: Endoscopy;  Laterality: N/A;   NASAL SINUS SURGERY  2002   Dr Carlis Abbott     MEDICATIONS:  Prior to Admission medications   Medication Sig Start Date End Date Taking? Authorizing Provider  albuterol (PROVENTIL) (2.5 MG/3ML) 0.083% nebulizer solution Take 3 mLs (2.5 mg total) by nebulization every 6 (six) hours as needed for wheezing or shortness of breath. 10/03/19  Yes Sowles, Drue Stager, MD  aspirin 81 MG tablet Take 1 tablet (81 mg total) by mouth daily. 01/08/16  Yes Sowles, Drue Stager, MD  atorvastatin (LIPITOR) 40 MG tablet Take 1 tablet (40 mg total) by mouth daily. 02/11/21  Yes Sowles, Drue Stager, MD  azelastine (ASTELIN) 0.1 % nasal spray Place 2 sprays into both nostrils 2 (two) times daily as needed for rhinitis or allergies. 07/31/20  Yes Delsa Grana, PA-C  baclofen (LIORESAL) 10 MG tablet Take 10 mg by mouth daily. 04/16/21  Yes [provider]  celecoxib (CELEBREX) 200 MG capsule Take 200 mg by mouth daily. 04/03/21  Yes [provider]  Cetirizine HCl (ZYRTEC ALLERGY) 10 MG TBDP Take 10 mg by mouth at bedtime. For sinus symptoms and seasonal allergies 07/31/20  Yes Delsa Grana, PA-C  docusate  sodium (COLACE) 100 MG capsule Take 100 mg by mouth daily.   Yes [provider]  fluticasone Asencion Islam) 50 MCG/ACT nasal spray  08/31/20  Yes [provider]  glucose blood (ACCU-CHEK AVIVA PLUS) test strip Use as instructed 12/08/17  Yes Sowles, Drue Stager, MD  Lancets (ACCU-CHEK SOFT TOUCH) lancets Use as instructed 12/08/17  Yes Sowles, Drue Stager, MD  meloxicam (MOBIC) 15 MG tablet Take 15 mg by mouth daily. 04/23/21  Yes [provider]  metFORMIN (GLUCOPHAGE-XR) 750 MG 24 hr tablet Take 1 tablet (750 mg total) by mouth daily with breakfast.  02/11/21  Yes Sowles, Drue Stager, MD  montelukast (SINGULAIR) 10 MG tablet Take 1 tablet (10 mg total) by mouth at bedtime. 02/11/21  Yes Sowles, Drue Stager, MD  Multiple Vitamins-Minerals (MULTIVITAL PO) Take 1 tablet by mouth daily.   Yes [provider]  omeprazole (PRILOSEC) 20 MG capsule Take 1 capsule (20 mg total) by mouth daily. 02/11/21  Yes Steele Sizer, MD  [place] 30 MG/2ML SOSY  10/09/20  Yes [provider]  triamterene-hydrochlorothiazide (DYAZIDE) 37.5-25 MG capsule TAKE 1 CAPSULE EVERY DAY 05/02/21  Yes Sowles, Drue Stager, MD  vitamin B-12 (CYANOCOBALAMIN) 100 MCG tablet Take 100 mcg by mouth daily.   Yes [provider]  calcium carbonate (OSCAL) 1500 (600 Ca) MG TABS tablet Take 600 mg of elemental calcium by mouth 2 (two) times daily with a meal. Patient not taking: Reported on 05/05/2021    [provider]  Fluticasone-Umeclidin-Vilant (TRELEGY ELLIPTA) 100-62.5-25 MCG/INH AEPB Inhale 1 puff into the lungs daily. In place of Stiolto 02/10/20   Steele Sizer, MD  loratadine (CLARITIN) 10 MG tablet Take 1 tablet (10 mg total) by mouth daily. 02/11/21   Steele Sizer, MD     ALLERGIES:  No Known Allergies   SOCIAL HISTORY:  Social History   Socioeconomic History   Marital status: Married    Spouse name: Trilby Drummer   Number of children: 1   Years of education: 12   Highest education level: 12th grade  Occupational History    Employer: RETIRED  Tobacco Use   Smoking status: Former    Packs/day: 2.00    Years: 40.00    Pack years: 80.00    Types: Cigarettes    Quit date: 2003    Years since quitting: 20.0   Smokeless tobacco: Former    Types: Snuff    Quit date: 04/2001   Tobacco comments:    smoking cessation materials not required  Vaping Use   Vaping Use: Never used  Substance and Sexual Activity   Alcohol use: No    Alcohol/week: 0.0 standard drinks   Drug use: No   Sexual activity: Not Currently  Other Topics Concern    Not on file  Social History Narrative   Lives locally w/ husband.  Does not routinely exercise.   Social Determinants of Health   Financial Resource Strain: Medium Risk   Difficulty of Paying Living Expenses: Somewhat hard  Food Insecurity: No Food Insecurity   Worried About Charity fundraiser in the Last Year: Never true   Ran Out of Food in the Last Year: Never true  Transportation Needs: No Transportation Needs   Lack of Transportation (Medical): No   Lack of Transportation (Non-Medical): No  Physical Activity: Inactive   Days of Exercise per Week: 0 days   Minutes of Exercise per Session: 0 min  Stress: No Stress Concern Present   Feeling of Stress : Not at all  Social  Connections: Moderately Integrated   Frequency of Communication with Friends and Family: More than three times a week   Frequency of Social Gatherings with Friends and Family: More than three times a week   Attends Religious Services: More than 4 times per year   Active Member of [employer] or Organizations: No   Attends Music therapist: Never   Marital Status: Married  Human resources officer Violence: Not At Risk   Fear of Current or Ex-Partner: No   Emotionally Abused: No   Physically Abused: No   Sexually Abused: No      FAMILY HISTORY:  Family History  Problem Relation Age of Onset   Congestive Heart Failure Mother    Coronary artery disease Father 5   Breast cancer Sister 72   Heart disease Brother    Varicose Veins Brother    Alcohol abuse Brother      REVIEW OF SYSTEMS:  Constitutional: denies weight loss, fever, positive for chills, or sweats  Eyes: denies any other vision changes, history of eye injury  ENT: denies sore throat, hearing problems  Respiratory: denies shortness of breath, wheezing  Cardiovascular: denies chest pain, palpitations  Gastrointestinal: abdominal pain, nausea and vomiting Genitourinary: denies burning with urination or urinary frequency Musculoskeletal:  Positive  joint pains or cramps  Skin: Positive for cellulitis and edema Neurological: denies any other headache, dizziness, weakness  Psychiatric: denies any other depression, anxiety   All other review of systems were negative   VITAL SIGNS:  Temp:  [99.7 F (37.6 C)-99.9 F (37.7 C)] 99.7 F (37.6 C) (01/24 0410) Pulse Rate:  [54-150] 101 (01/24 1600) Resp:  [16-23] 23 (01/24 1600) BP: (73-97)/(47-75) 97/53 (01/24 1600) SpO2:  [88 %-98 %] 94 % (01/24 1600)     Height: 5\' 5"  (165.1 cm) Weight: 123.4 kg BMI (Calculated): 45.27   INTAKE/OUTPUT:  This shift: Total I/O In: 220 [P.O.:220] Out: -   Last 2 shifts: @IOLAST2SHIFTS @   PHYSICAL EXAM:  Constitutional:  -- Obese --Lethargic Eyes:  -- Pupils equally round and reactive to light  -- No scleral icterus  Ear, nose, and throat:  -- No jugular venous distension  Pulmonary:  -- No crackles  -- Equal breath sounds bilaterally -- Breathing non-labored at rest Cardiovascular:  -- S1, S2 present  -- No pericardial rubs --Irregular rhythm Gastrointestinal:  -- Abdomen soft, nontender, non-distended, no guarding or rebound tenderness -- No abdominal masses appreciated, pulsatile or otherwise  Musculoskeletal and Integumentary:  -- Wounds: None appreciated -- Extremities: Lateral lower extremity edema.  Worse on the left leg.  There is severe edema with cellulitis and bullae of the left leg.  There is severe tenderness to palpation. Neurologic:  -- Motor function: intact and symmetric -- Sensation: intact and symmetric   Leg on 05/04/20:                          Leg on 05/07/20:     Labs:  CBC Latest Ref Rng & Units 05/07/2021 05/06/2021 05/05/2021  WBC 4.0 - 10.5 K/uL 21.9(H) 16.2(H) 17.6(H)  Hemoglobin 12.0 - 15.0 g/dL 9.7(L) 10.5(L) 10.8(L)  Hematocrit 36.0 - 46.0 % 29.9(L) 33.4(L) 35.7(L)  Platelets 150 - 400 K/uL 294 240 228   CMP Latest Ref Rng & Units 05/07/2021 05/06/2021 05/05/2021  Glucose 70 - 99 mg/dL 148(H)  189(H) 166(H)  BUN 8 - 23 mg/dL 69(H) 60(H) 54(H)  Creatinine 0.44 - 1.00 mg/dL 3.32(H) 3.46(H) 3.43(H)  Sodium 135 -  145 mmol/L 134(L) 138 139  Potassium 3.5 - 5.1 mmol/L 3.4(L) 4.2 3.8  Chloride 98 - 111 mmol/L 98 104 105  CO2 22 - 32 mmol/L 22 23 18(L)  Calcium 8.9 - 10.3 mg/dL 7.0(L) 7.4(L) 7.7(L)  Total Protein 6.5 - 8.1 g/dL - - -  Total Bilirubin 0.3 - 1.2 mg/dL - - -  Alkaline Phos 38 - 126 U/L - - -  AST 15 - 41 U/L - - -  ALT 0 - 44 U/L - - -    Imaging studies:  EXAM: CT OF THE LOWER LEFT EXTREMITY WITHOUT CONTRAST   TECHNIQUE: Multidetector CT imaging of the lower left extremity was performed according to the standard protocol.   RADIATION DOSE REDUCTION: This exam was performed according to the departmental dose-optimization program which includes automated exposure control, adjustment of the mA and/or kV according to patient size and/or use of iterative reconstruction technique.   COMPARISON:  None.   FINDINGS: Bones/Joint/Cartilage   No acute fracture or dislocation. There is moderate arthritic changes of the knee with tricompartmental narrowing.   Ligaments   Suboptimally assessed by CT.   Muscles and Tendons   No acute findings.   Soft tissues   Diffuse subcutaneous edema may represent cellulitis. No drainable fluid collection/abscess. No soft tissue gas.   IMPRESSION: Diffuse subcutaneous edema may represent cellulitis. No drainable fluid collection/abscess.     Electronically Signed   By: Anner Crete M.D.   On: 05/04/2021 22:22  Assessment/Plan:  71 y.o. female with left leg necrotizing fasciitis, complicated by pertinent comorbidities including streptococcal toxic shock syndrome, strep pyogenes bacteremia, COPD, type 2 diabetes mellitus, hypertension, obstructive sleep apnea, GERD.  I discussed with the family including the husband and the daughter about the difficult situation.  Patient with multiple comorbidities with severe  necrotizing fasciitis of the left leg.  I discussed with the family my recommendation about debridement of the left leg.  The goal of surgery is to control the infection source.  Patient will ended up with huge wound defect with difficult wound care.  This can be a very long-term issue.  Patient diabetic which make having uncontrolled infection very difficult.  I also discussed with patient the possibility of getting out of the surgery with need of mechanical ventilation.  After a long discussion the husband and the daughter agreed with plan of debridement.  Patient not been able to participate in discussion due to lethargy.   Arnold Long, MD

## 2021-05-07 NOTE — Op Note (Addendum)
ATTENDING Surgeon(s): Herbert Pun, MD   ANESTHESIA: General   PRE-OPERATIVE DIAGNOSIS: Left leg necrotizing fasciitis   POST-OPERATIVE DIAGNOSIS: Same   PROCEDURE(S):  1.) Sharp excisional debridement of left leg    INTRAOPERATIVE FINDINGS:  Pre Operative measurement 40 x 30 cm area of cellulitis  Post operative: A 30 x 30 cm (900 sq cm) area of debridement into tibial bone Necrosis of left leg fascia almost circumferentially.    ESTIMATED BLOOD LOSS: 600 mL    SPECIMENS: left leg necrotizing fasciitis   COMPLICATIONS: None apparent   CONDITION AT END OF PROCEDURE: Hemodynamically stable and awake   INDICATIONS FOR PROCEDURE:  Patient is a 71 year old female with left leg necrotizing fasciitis. Patient with leukocytosis, pain, cellulitis and blister.       DETAILS OF PROCEDURE: After informed consent,patient was taken to the OR. Time out performed. General anesthesia induced. Patient placed on supine position. The left leg  was cleaned and draped in sterile fashion. With #15 blade and scissors and electrocautery, necrotic tissue from the left leg area was resected. Ischemic fat tissue, muscle and bone were debrided down to the tibial bone . Pulse lavage was used for debridement and irrigation. Hemostasis achieved. The large wound was dressed with Kerlex wet to dry with Dakins solution.   Herbert Pun, MD, FACS

## 2021-05-07 NOTE — Anesthesia Preprocedure Evaluation (Addendum)
Anesthesia Evaluation  Patient identified by MRN, date of birth, ID bandGeneral Assessment Comment: Patient AOx3 but somnolent.  Patient presented with leg pain, found to be in septic shock and new onset RVR, requiring amiodarone for rhythm control and pressors for BP. Now converted to sinus rhythm. Concern for necrotizing fasciitis per surgeon.  Reviewed: Allergy & Precautions, NPO status , Patient's Chart, lab work & pertinent test results  History of Anesthesia Complications Negative for: history of anesthetic complications  Airway Mallampati: III  TM Distance: >3 FB Neck ROM: Full    Dental no notable dental hx. (+) Teeth Intact   Pulmonary neg pulmonary ROS, asthma , sleep apnea and Continuous Positive Airway Pressure Ventilation , COPD,  COPD inhaler, Patient abstained from smoking.Not current smoker, former smoker,    Pulmonary exam normal breath sounds clear to auscultation       Cardiovascular Exercise Tolerance: Good METShypertension, (-) CAD and (-) Past MI + dysrhythmias Atrial Fibrillation  Rhythm:Regular Rate:Normal - Systolic murmurs Echo 1. Left ventricular ejection fraction, by estimation, is 50 to 55%. The  left ventricle has low normal function. Left ventricular endocardial  border not optimally defined to evaluate regional wall motion. There is  mild left ventricular hypertrophy. Left  ventricular diastolic parameters are indeterminate.  2. Right ventricular systolic function is normal. The right ventricular  size is normal. Tricuspid regurgitation signal is inadequate for assessing  PA pressure.  3. Left atrial size was mildly dilated.  4. Right atrial size was mildly dilated.  5. The mitral valve is normal in structure. No evidence of mitral valve  regurgitation. No evidence of mitral stenosis.  6. The aortic valve is normal in structure. Aortic valve regurgitation is  not visualized. Aortic valve  sclerosis/calcification is present, without  any evidence of aortic stenosis.  7. challenging image quality.    Neuro/Psych negative neurological ROS  negative psych ROS   GI/Hepatic neg GERD  ,(+)     (-) substance abuse  ,   Endo/Other  diabetes, Well ControlledMorbid obesity  Renal/GU CRF and ARFRenal disease     Musculoskeletal  (+) Arthritis ,   Abdominal (+) + obese,   Peds  Hematology   Anesthesia Other Findings Past Medical History: No date: AKI (acute kidney injury) ([place])     Comment:  a. 04/2021 in setting of bacteremia/shock. No date: Arthritis No date: Asthma No date: Bacteremia     Comment:  a. 04/2021 S pyogenes bacteremia in setting of lower ext               cellulitis. No date: COPD (chronic obstructive pulmonary disease) (HCC) No date: Diabetes mellitus without complication (HCC) No date: Endometriosis No date: GERD (gastroesophageal reflux disease) No date: History of echocardiogram     Comment:  a. 07/2013 Echo: EF 55-60%, impaired relaxation, mild TR;              b. 04/2021 Echo: EF 50-55%, mild LVH, nl RV fxn, mild BAE,              Ao sclerosis w/o stenosis. No date: Hypertension No date: Obesity No date: PAF (paroxysmal atrial fibrillation) ([place])     Comment:  a. 04/2021 in setting of septic shock/cellulitis. No date: Sleep apnea     Comment:  CPAP  Reproductive/Obstetrics                            Anesthesia Physical Anesthesia Plan  ASA:  4  Anesthesia Plan: General   Post-op Pain Management: Ofirmev IV (intra-op) and Dilaudid IV   Induction: Intravenous and Rapid sequence  PONV Risk Score and Plan: 4 or greater and Ondansetron, Dexamethasone, Midazolam and Treatment may vary due to age or medical condition  Airway Management Planned: Oral ETT and Video Laryngoscope Planned  Additional Equipment: None  Intra-op Plan:   Post-operative Plan: Extubation in OR and Possible Post-op  intubation/ventilation  Informed Consent: I have reviewed the patients History and Physical, chart, labs and discussed the procedure including the risks, benefits and alternatives for the proposed anesthesia with the patient or authorized representative who has indicated his/her understanding and acceptance.     Dental advisory given and Consent reviewed with POA  Plan Discussed with: CRNA and Surgeon  Anesthesia Plan Comments: (Discussed risks of anesthesia with patient, though due to her somnolence I asked permission if I could talk to her daughter and husband at bedside as well which she agreed. Risks discussed including PONV, sore throat, lip/dental/eye damage. Rare risks discussed as well, such as cardiorespiratory and neurological sequelae, and allergic reactions. Patient counseled on being higher risk for anesthesia due to comorbidities: septic shock, new onset afib, morbid obesity. Patient was told about increased risk of cardiac and respiratory events, including death. Discussed DNR, and patient said she would want to be resuscitated but not be artificially kept alive for a prolonged period. Discussed possible post op intubation. Discussed the role of CRNA in patient's perioperative care. Patient and the family at bedside understand.)        Anesthesia Quick Evaluation

## 2021-05-07 NOTE — Progress Notes (Signed)
Notified of patients continued low blood pressure w/systolics of 24M and MAPs around 68. Remains in Afib w/RVR, HR 130s-140s. She was started on an Amio gtt today and received a bolus prior to initiation.   Heart rate difficult to control with additional medications given low blood pressure. Unable to give Digoxin 2/2 AKI.   Fluid status currently -150cc. Will give additonal 500cc LR bolus x1 now. If no improvement in blood pressure then will initiate Neo gtt.     Tonye Royalty ACNP-BC

## 2021-05-07 NOTE — Consult Note (Signed)
ANTICOAGULATION CONSULT NOTE  Pharmacy Consult for heparin Indication: chest pain/ACS  No Known Allergies  Patient Measurements: Height: 5\' 5"  (165.1 cm) Weight: 123.4 kg (272 lb 0.8 oz) IBW/kg (Calculated) : 57 Heparin Dosing Weight: 86.1 kg  Vital Signs: Temp: 99.7 F (37.6 C) (01/24 0410) Temp Source: Axillary (01/24 0410) BP: 91/64 (01/24 0530) Pulse Rate: 97 (01/24 0530)  Labs: Recent Labs    05/04/21 1943 05/05/21 0027 05/05/21 0454 05/05/21 1737 05/06/21 0325 05/07/21 0007  HGB 12.5  --  10.8*  --  10.5*  --   HCT 40.8  --  35.7*  --  33.4*  --   PLT 282  --  228  --  240  --   APTT  --  36   33  --   --   --   --   LABPROT  --  16.1*  --   --   --   --   INR  --  1.3*  --   --   --   --   HEPARINUNFRC  --   --   --   --   --  0.45  CREATININE 3.47*  --  3.30* 3.43* 3.46*  --      Estimated Creatinine Clearance: 19.4 mL/min (A) (by C-G formula based on SCr of 3.46 mg/dL (H)).   Medical History: Past Medical History:  Diagnosis Date   AKI (acute kidney injury) ([place])    a. 04/2021 in setting of bacteremia/shock.   Arthritis    Asthma    Bacteremia    a. 04/2021 S pyogenes bacteremia in setting of lower ext cellulitis.   COPD (chronic obstructive pulmonary disease) (HCC)    Diabetes mellitus without complication (HCC)    Endometriosis    GERD (gastroesophageal reflux disease)    History of echocardiogram    a. 07/2013 Echo: EF 55-60%, impaired relaxation, mild TR; b. 04/2021 Echo: EF 50-55%, mild LVH, nl RV fxn, mild BAE, Ao sclerosis w/o stenosis.   Hypertension    Obesity    PAF (paroxysmal atrial fibrillation) ([place])    a. 04/2021 in setting of septic shock/cellulitis.   Sleep apnea    CPAP    Medications:  Medications Prior to Admission  Medication Sig Dispense Refill Last Dose   albuterol (PROVENTIL) (2.5 MG/3ML) 0.083% nebulizer solution Take 3 mLs (2.5 mg total) by nebulization every 6 (six) hours as needed for wheezing or shortness of breath.  150 mL 1 Past Month   aspirin 81 MG tablet Take 1 tablet (81 mg total) by mouth daily. 30 tablet 0 Past Month   atorvastatin (LIPITOR) 40 MG tablet Take 1 tablet (40 mg total) by mouth daily. 90 tablet 1 Past Month   azelastine (ASTELIN) 0.1 % nasal spray Place 2 sprays into both nostrils 2 (two) times daily as needed for rhinitis or allergies. 30 mL 1 Past Month   baclofen (LIORESAL) 10 MG tablet Take 10 mg by mouth daily.   Past Month   celecoxib (CELEBREX) 200 MG capsule Take 200 mg by mouth daily.   Past Month   Cetirizine HCl (ZYRTEC ALLERGY) 10 MG TBDP Take 10 mg by mouth at bedtime. For sinus symptoms and seasonal allergies 30 tablet 1 Past Month   docusate sodium (COLACE) 100 MG capsule Take 100 mg by mouth daily.   Past Month   fluticasone (FLONASE) 50 MCG/ACT nasal spray    Past Month   glucose blood (ACCU-CHEK AVIVA PLUS) test strip Use as  instructed 100 each 12 Past Month   Lancets (ACCU-CHEK SOFT TOUCH) lancets Use as instructed 100 each 12 Past Month   meloxicam (MOBIC) 15 MG tablet Take 15 mg by mouth daily.   Past Month   metFORMIN (GLUCOPHAGE-XR) 750 MG 24 hr tablet Take 1 tablet (750 mg total) by mouth daily with breakfast. 90 tablet 1 Past Month   montelukast (SINGULAIR) 10 MG tablet Take 1 tablet (10 mg total) by mouth at bedtime. 90 tablet 1 Past Month   Multiple Vitamins-Minerals (MULTIVITAL PO) Take 1 tablet by mouth daily.   Past Month   omeprazole (PRILOSEC) 20 MG capsule Take 1 capsule (20 mg total) by mouth daily. 90 capsule 1 Past Month   ORTHOVISC 30 MG/2ML SOSY    Past Month   triamterene-hydrochlorothiazide (DYAZIDE) 37.5-25 MG capsule TAKE 1 CAPSULE EVERY DAY 90 capsule 1 Past Month   vitamin B-12 (CYANOCOBALAMIN) 100 MCG tablet Take 100 mcg by mouth daily.   Past Month   calcium carbonate (OSCAL) 1500 (600 Ca) MG TABS tablet Take 600 mg of elemental calcium by mouth 2 (two) times daily with a meal. (Patient not taking: Reported on 05/05/2021)   Not Taking    Fluticasone-Umeclidin-Vilant (TRELEGY ELLIPTA) 100-62.5-25 MCG/INH AEPB Inhale 1 puff into the lungs daily. In place of Stiolto 180 each 1 PRN at PRN   loratadine (CLARITIN) 10 MG tablet Take 1 tablet (10 mg total) by mouth daily. 90 tablet 1 PRN at PRN   Scheduled:   aspirin EC  81 mg Oral Daily   atorvastatin  40 mg Oral Daily   Chlorhexidine Gluconate Cloth  6 each Topical Q0600   insulin aspart  0-5 Units Subcutaneous QHS   insulin aspart  0-9 Units Subcutaneous TID WC   midodrine  10 mg Oral TID WC   mometasone-formoterol  2 puff Inhalation BID   montelukast  10 mg Oral QHS   pantoprazole  40 mg Oral Daily   umeclidinium bromide  1 puff Inhalation Daily   Infusions:   sodium chloride Stopped (05/05/21 0816)   amiodarone 30 mg/hr (05/07/21 0141)   clindamycin (CLEOCIN) IV 900 mg (05/07/21 0549)   heparin 1,200 Units/hr (05/06/21 1817)   pencillin G potassium IV 4 Million Units (05/07/21 0141)   phenylephrine (NEO-SYNEPHRINE) Adult infusion 60 mcg/min (05/07/21 0654)   PRN: docusate sodium, oxyCODONE, polyethylene glycol Anti-infectives (From admission, onward)    Start     Dose/Rate Route Frequency Ordered Stop   05/06/21 2200  clindamycin (CLEOCIN) IVPB 900 mg        900 mg 100 mL/hr over 30 Minutes Intravenous Every 8 hours 05/06/21 1519     05/06/21 0030  penicillin G potassium 4 Million Units in dextrose 5 % 250 mL IVPB        4 Million Units 250 mL/hr over 60 Minutes Intravenous Every 8 hours 05/05/21 1023     05/05/21 1400  clindamycin (CLEOCIN) IVPB 600 mg  Status:  Discontinued        600 mg 100 mL/hr over 30 Minutes Intravenous Every 8 hours 05/05/21 1010 05/06/21 1519   05/05/21 1200  penicillin G potassium 4 Million Units in dextrose 5 % 250 mL IVPB  Status:  Discontinued        4 Million Units 250 mL/hr over 60 Minutes Intravenous Every 4 hours 05/05/21 1018 05/05/21 1024   05/04/21 2345  ceFEPIme (MAXIPIME) 2 g in sodium chloride 0.9 % 100 mL IVPB  Status:   Discontinued  2 g 200 mL/hr over 30 Minutes Intravenous Every 24 hours 05/04/21 2328 05/05/21 1012   05/04/21 2345  vancomycin (VANCOREADY) IVPB 1500 mg/300 mL        1,500 mg 150 mL/hr over 120 Minutes Intravenous  Once 05/04/21 2337 05/05/21 0349   05/04/21 2338  vancomycin variable dose per unstable renal function (pharmacist dosing)  Status:  Discontinued         Does not apply See admin instructions 05/04/21 2338 05/05/21 1030   05/04/21 2330  vancomycin (VANCOCIN) IVPB 1000 mg/200 mL premix  Status:  Discontinued        1,000 mg 200 mL/hr over 60 Minutes Intravenous  Once 05/04/21 2328 05/04/21 2336   05/04/21 2045  vancomycin (VANCOCIN) IVPB 1000 mg/200 mL premix        1,000 mg 200 mL/hr over 60 Minutes Intravenous  Once 05/04/21 2040 05/04/21 2243   05/04/21 2045  cefTRIAXone (ROCEPHIN) 2 g in sodium chloride 0.9 % 100 mL IVPB        2 g 200 mL/hr over 30 Minutes Intravenous  Once 05/04/21 2040 05/04/21 2206       Assessment: Pharmacy consulted to heparin for ACS. No trops ordered. ECG with no acute ST/T changes per note. Pt does have new onset afib. [id number] 4.   Goal of Therapy:  Heparin level 0.3-0.7 units/ml Monitor platelets by anticoagulation protocol: Yes  Date Time   HL Rate/Comment 0124 0007 0.45 Therapeutic x1 0124 0821 0.44 Therapeutic x2     Baseline Labs: aPTT - 33s INR - 1.3 Hgb - 12.5>10.8>10.5>9.7 Plts - 228>240>294   Plan:  HL therapeutic x2. Continue heparin infusion at 1200 units/hr Check Heparin level daily with AM Labs since consecutively therapeutic CTM CBC daily while on heparin gtt  Lorna Dibble, PharmD, [place] Pharmacist 05/07/2021 10:43 AM

## 2021-05-07 NOTE — Progress Notes (Signed)
PHARMACY NOTE:  ANTIMICROBIAL RENAL DOSAGE ADJUSTMENT  Current antimicrobial regimen includes a mismatch between antimicrobial dosage and estimated renal function.  As per policy approved by the Pharmacy & Therapeutics and Medical Executive Committees, the antimicrobial dosage will be adjusted accordingly.  Current antimicrobial dosage: Penicillin G  Indication: Group A streptococcus bacteremia  Renal Function:  Estimated Creatinine Clearance: 20.2 mL/min (A) (by C-G formula based on SCr of 3.32 mg/dL (H)). []      On intermittent HD, scheduled: []      On CRRT    Antimicrobial dosage has been changed to:  Penicillin G 3 MU q4h  Additional comments: Will monitor renal function and need to adjust dose based on ID's plan for antibiotics.   Thank you for allowing pharmacy to be a part of this patient's care.  Doreene Eland, PharmD, BCPS, BCIDP Work Cell: [phone] 05/07/2021 4:18 PM

## 2021-05-07 NOTE — Progress Notes (Signed)
Progress Note    Alexandra Foster  ZOX:[id number] DOB: October 26, 1950  DOA: 05/04/2021 PCP: [name], MD      Brief Narrative:    Medical records reviewed and are as summarized below:  Alexandra Foster is a 71 y.o. female with medical history significant of Asthma/COPD, T2DM, HTN, OSA on CPAP, GERD, COVID-19 pneumonia, and Bilateral lower extremity edema who presented to the ED with chief complaints of left lower extremity pain and chills.      Assessment/Plan:   Principal Problem:   Severe sepsis with lactic acidosis (HCC)   Body mass index is 45.27 kg/m.  (Morbid obesity)  Septic shock from strep pyogenes bacteremia and left leg cellulitis with necrotizing fasciitis: Continue IV penicillin and IV clindamycin.  Analgesics as needed for pain.  She remains on IV Neo-Synephrine drip for refractory hypotension.  Systolic BP was 85 this morning when I saw her.  Consulted Dr. Hazle Quant, general surgeon, for further evaluation.  Patient will be taken to the OR for surgery today.  Follow-up with ID.  Nonoliguric AKI: Creatinine is trending down but is still elevated and not back to baseline.  Monitor BMP closely  Atrial fibrillation with RVR: She was on IV amiodarone drip and IV heparin infusion.  However, IV heparin drip has been discontinued because of drop in hemoglobin  Acute on chronic anemia: This may be partly due to hemodilution.  Monitor H&H closely.  Type II DM with hyperglycemia: Use NovoLog as needed for hyperglycemia.  Hypokalemia: Replete potassium and monitor levels.   Other comorbidities include asthma, COPD, OSA, GERD   Plan of care was discussed with the patient and her sister at the bedside.  Patient was transferred to the hospitalist service today.  However, given her critical illness (septic shock on Neo-Synephrine drip, AKI and plan for debridement of left leg necrotizing fasciitis today), patient will be transferred back to the critical  care service.  Case was discussed with Dr. Belia Heman, who has graciously accepted the patient in transfer.   CRITICAL CARE Performed by: Lurene Shadow   Total critical care time: 36 minutes  Critical care time was exclusive of separately billable procedures and treating other patients.  Critical care was necessary to treat or prevent imminent or life-threatening deterioration.  Critical care was time spent personally by me on the following activities: development of treatment plan with patient and/or surrogate as well as nursing, discussions with consultants, evaluation of patient's response to treatment, examination of patient, obtaining history from patient or surrogate, ordering and performing treatments and interventions, ordering and review of laboratory studies, ordering and review of radiographic studies, pulse oximetry and re-evaluation of patient's condition.     Diet Order             Diet NPO time specified  Diet effective now                      Consultants: Cardiologist Intensivist Infectious disease General surgeon  Procedures: None    Medications:    amiodarone  150 mg Intravenous Once   aspirin EC  81 mg Oral Daily   atorvastatin  40 mg Oral Daily   Chlorhexidine Gluconate Cloth  6 each Topical Q0600   insulin aspart  0-5 Units Subcutaneous QHS   insulin aspart  0-9 Units Subcutaneous TID WC   midodrine  10 mg Oral TID WC   mometasone-formoterol  2 puff Inhalation BID   montelukast  10 mg Oral QHS   [  START ON 05/08/2021] multivitamin with minerals  1 tablet Oral Daily   pantoprazole  40 mg Oral Daily   Ensure Max Protein  11 oz Oral BID   umeclidinium bromide  1 puff Inhalation Daily   Continuous Infusions:  sodium chloride Stopped (05/05/21 0816)   amiodarone 30 mg/hr (05/07/21 1212)   clindamycin (CLEOCIN) IV 900 mg (05/07/21 0549)   pencillin G potassium IV     pencillin G potassium IV 4 Million Units (05/07/21 0903)   phenylephrine  (NEO-SYNEPHRINE) Adult infusion 60 mcg/min (05/07/21 1041)     Anti-infectives (From admission, onward)    Start     Dose/Rate Route Frequency Ordered Stop   05/07/21 2215  penicillin G potassium 3 Million Units in dextrose 50mL IVPB        3 Million Units 100 mL/hr over 30 Minutes Intravenous Every 4 hours 05/07/21 1601     05/06/21 2200  clindamycin (CLEOCIN) IVPB 900 mg        900 mg 100 mL/hr over 30 Minutes Intravenous Every 8 hours 05/06/21 1519     05/06/21 0030  penicillin G potassium 4 Million Units in dextrose 5 % 250 mL IVPB        4 Million Units 250 mL/hr over 60 Minutes Intravenous Every 8 hours 05/05/21 1023 05/08/21 0029   05/05/21 1400  clindamycin (CLEOCIN) IVPB 600 mg  Status:  Discontinued        600 mg 100 mL/hr over 30 Minutes Intravenous Every 8 hours 05/05/21 1010 05/06/21 1519   05/05/21 1200  penicillin G potassium 4 Million Units in dextrose 5 % 250 mL IVPB  Status:  Discontinued        4 Million Units 250 mL/hr over 60 Minutes Intravenous Every 4 hours 05/05/21 1018 05/05/21 1024   05/04/21 2345  ceFEPIme (MAXIPIME) 2 g in sodium chloride 0.9 % 100 mL IVPB  Status:  Discontinued        2 g 200 mL/hr over 30 Minutes Intravenous Every 24 hours 05/04/21 2328 05/05/21 1012   05/04/21 2345  vancomycin (VANCOREADY) IVPB 1500 mg/300 mL        1,500 mg 150 mL/hr over 120 Minutes Intravenous  Once 05/04/21 2337 05/05/21 0349   05/04/21 2338  vancomycin variable dose per unstable renal function (pharmacist dosing)  Status:  Discontinued         Does not apply See admin instructions 05/04/21 2338 05/05/21 1030   05/04/21 2330  vancomycin (VANCOCIN) IVPB 1000 mg/200 mL premix  Status:  Discontinued        1,000 mg 200 mL/hr over 60 Minutes Intravenous  Once 05/04/21 2328 05/04/21 2336   05/04/21 2045  vancomycin (VANCOCIN) IVPB 1000 mg/200 mL premix        1,000 mg 200 mL/hr over 60 Minutes Intravenous  Once 05/04/21 2040 05/04/21 2243   05/04/21 2045  cefTRIAXone  (ROCEPHIN) 2 g in sodium chloride 0.9 % 100 mL IVPB        2 g 200 mL/hr over 30 Minutes Intravenous  Once 05/04/21 2040 05/04/21 2206              Family Communication/Anticipated D/C date and plan/Code Status   DVT prophylaxis:      Code Status: Full Code  Family Communication: Discussed with her sister at the bedside Disposition Plan: To be determined   Status is: Inpatient  Remains inpatient appropriate because: On IV antibiotics and vasopressors  Subjective:   Interval events noted.  She complains of pain in the left leg.  Objective:    Vitals:   05/07/21 1100 05/07/21 1400 05/07/21 1500 05/07/21 1600  BP: (!) 91/50 (!) 95/54 (!) 76/61 (!) 97/53  Pulse: 89 90 89 (!) 101  Resp: 20 20 19  (!) 23  Temp:      TempSrc:      SpO2: 94% 95% 93% 94%  Weight:      Height:       No data found.   Intake/Output Summary (Last 24 hours) at 05/07/2021 1617 Last data filed at 05/07/2021 1400 Gross per 24 hour  Intake 220 ml  Output 700 ml  Net -480 ml   Filed Weights   05/04/21 1941 05/05/21 1100  Weight: 120.7 kg 123.4 kg    Exam:  GEN: NAD SKIN: Warm and dry EYES: No pallor or icterus ENT: MMM CV: RRR PULM: CTA B ABD: soft, obese, NT, +BS CNS: AAO x 3, non focal EXT: Left leg swelling with tenderness.  Blisters have formed on the left leg        Data Reviewed:   I have personally reviewed following labs and imaging studies:  Labs: Labs show the following:   Basic Metabolic Panel: Recent Labs  Lab 05/04/21 1943 05/05/21 0454 05/05/21 1153 05/05/21 1737 05/06/21 0325 05/07/21 0821  NA 141 140  --  139 138 134*  K 2.9* 3.3*  --  3.8 4.2 3.4*  CL 99 106  --  105 104 98  CO2 26 21*  --  18* 23 22  GLUCOSE 144* 114*  --  166* 189* 148*  BUN 44* 48*  --  54* 60* 69*  CREATININE 3.47* 3.30*  --  3.43* 3.46* 3.32*  CALCIUM 8.7* 7.8*  --  7.7* 7.4* 7.0*  MG  --  1.5* 2.2  --  2.4 2.5*  PHOS  --  2.8 4.5  --  4.4 4.5    GFR Estimated Creatinine Clearance: 20.2 mL/min (A) (by C-G formula based on SCr of 3.32 mg/dL (H)). Liver Function Tests: Recent Labs  Lab 05/04/21 1943  AST 67*  ALT 48*  ALKPHOS 103  BILITOT 1.1  PROT 7.9  ALBUMIN 2.8*   No results for input(s): LIPASE, AMYLASE in the last 168 hours. No results for input(s): AMMONIA in the last 168 hours. Coagulation profile Recent Labs  Lab 05/05/21 0027  INR 1.3*    CBC: Recent Labs  Lab 05/04/21 1943 05/05/21 0454 05/06/21 0325 05/07/21 0821  WBC 16.5* 17.6* 16.2* 21.9*  NEUTROABS 15.5*  --   --   --   HGB 12.5 10.8* 10.5* 9.7*  HCT 40.8 35.7* 33.4* 29.9*  MCV 78.5* 77.9* 75.9* 73.3*  PLT 282 228 240 294   Cardiac Enzymes: No results for input(s): CKTOTAL, CKMB, CKMBINDEX, TROPONINI in the last 168 hours. BNP (last 3 results) No results for input(s): PROBNP in the last 8760 hours. CBG: Recent Labs  Lab 05/06/21 1555 05/06/21 2119 05/07/21 0738 05/07/21 1135 05/07/21 1550  GLUCAP 153* 145* 142* 148* 111*   D-Dimer: Recent Labs    05/05/21 0027  DDIMER 2.22*   Hgb A1c: Recent Labs    05/05/21 0453  HGBA1C 6.7*   Lipid Profile: No results for input(s): CHOL, HDL, LDLCALC, TRIG, CHOLHDL, LDLDIRECT in the last 72 hours. Thyroid function studies: No results for input(s): TSH, T4TOTAL, T3FREE, THYROIDAB in the last 72 hours.  Invalid input(s): FREET3 Anemia work up: No results  for input(s): VITAMINB12, FOLATE, FERRITIN, TIBC, IRON, RETICCTPCT in the last 72 hours. Sepsis Labs: Recent Labs  Lab 05/04/21 1943 05/04/21 2130 05/04/21 2150 05/05/21 0454 05/05/21 1153 05/05/21 1737 05/05/21 2017 05/06/21 0325 05/07/21 0821 05/07/21 1046  PROCALCITON  --  35.76  --  40.92  --   --   --  66.16  --   --   WBC 16.5*  --   --  17.6*  --   --   --  16.2* 21.9*  --   LATICACIDVEN 3.8*  --    < >  --    < > 4.1* 2.9*  --  1.7 1.7   < > = values in this interval not displayed.    Microbiology Recent  Results (from the past 240 hour(s))  Novel Coronavirus, NAA ([facility])     Status: None   Collection Time: 04/30/21 12:00 AM   Specimen: Nasopharyngeal(NP) swabs in vial transport medium   Nasopharynge  Previous  Result Value Ref Range Status   SARS-CoV-2, NAA Not Detected Not Detected Final    Comment: This nucleic acid amplification test was developed and its performance characteristics determined by [employer]. Nucleic acid amplification tests include RT-PCR and TMA. This test has not been FDA cleared or approved. This test has been authorized by FDA under an Emergency Use Authorization (EUA). This test is only authorized for the duration of time the declaration that circumstances exist justifying the authorization of the emergency use of in vitro diagnostic tests for detection of SARS-CoV-2 virus and/or diagnosis of COVID-19 infection under section 564(b)(1) of the Act, 21 U.S.C. 563OVF-6(E) (1), unless the authorization is terminated or revoked sooner. When diagnostic testing is negative, the possibility of a false negative result should be considered in the context of a patient's recent exposures and the presence of clinical signs and symptoms consistent with COVID-19. An individual without symptoms of COVID-19 and who is not shedding SARS-CoV-2 virus wo uld expect to have a negative (not detected) result in this assay.   SARS-COV-2, NAA 2 DAY TAT     Status: None   Collection Time: 04/30/21 12:00 AM   Nasopharynge  Previous  Result Value Ref Range Status   SARS-CoV-2, NAA 2 DAY TAT Performed  Final  Blood Culture (routine x 2)     Status: Abnormal   Collection Time: 05/04/21  7:43 PM   Specimen: BLOOD  Result Value Ref Range Status   Specimen Description   Final    BLOOD RIGHT ANTECUBITAL Performed at [facility], [address], [place], [place] [address]    Special Requests   Final    BOTTLES DRAWN AEROBIC AND ANAEROBIC Blood Culture adequate  volume Performed at [facility], [address] [place], [place] [address]    Culture  Setup Time   Final    GRAM POSITIVE COCCI IN BOTH AEROBIC AND ANAEROBIC BOTTLES RESULT CALLED TO, READ BACK BY AND VERIFIED WITH: Delight Stare 05/05/21 @ 1007 BY SB    Culture (A)  Final    GROUP A STREP (S.PYOGENES) ISOLATED HEALTH DEPARTMENT NOTIFIED Performed at [facility], [address] [place], [place] [address]    Report Status 05/07/2021 FINAL  Final   Organism ID, Bacteria GROUP A STREP (S.PYOGENES) ISOLATED  Final      Susceptibility   Group a strep (s.pyogenes) isolated - MIC*    PENICILLIN <=0.06 SENSITIVE Sensitive     CEFTRIAXONE <=0.12 SENSITIVE Sensitive  ERYTHROMYCIN <=0.12 SENSITIVE Sensitive     LEVOFLOXACIN 1 SENSITIVE Sensitive     VANCOMYCIN 0.5 SENSITIVE Sensitive     * GROUP A STREP (S.PYOGENES) ISOLATED  Blood Culture ID Panel (Reflexed)     Status: Abnormal   Collection Time: 05/04/21  7:43 PM  Result Value Ref Range Status   Enterococcus faecalis NOT DETECTED NOT DETECTED Final   Enterococcus Faecium NOT DETECTED NOT DETECTED Final   Listeria monocytogenes NOT DETECTED NOT DETECTED Final   Staphylococcus species NOT DETECTED NOT DETECTED Final   Staphylococcus aureus (BCID) NOT DETECTED NOT DETECTED Final   Staphylococcus epidermidis NOT DETECTED NOT DETECTED Final   Staphylococcus lugdunensis NOT DETECTED NOT DETECTED Final   Streptococcus species DETECTED (A) NOT DETECTED Final    Comment: RESULT CALLED TO, READ BACK BY AND VERIFIED WITH: Delight Stare 05/05/21 @ 1007 BY SB    Streptococcus agalactiae NOT DETECTED NOT DETECTED Final   Streptococcus pneumoniae NOT DETECTED NOT DETECTED Final   Streptococcus pyogenes DETECTED (A) NOT DETECTED Final    Comment: RESULT CALLED TO, READ BACK BY AND VERIFIED WITH: Delight Stare 05/05/21 @ 1007 BY SB    A.calcoaceticus-baumannii NOT DETECTED NOT DETECTED Final   Bacteroides fragilis NOT  DETECTED NOT DETECTED Final   Enterobacterales NOT DETECTED NOT DETECTED Final   Enterobacter cloacae complex NOT DETECTED NOT DETECTED Final   Escherichia coli NOT DETECTED NOT DETECTED Final   Klebsiella aerogenes NOT DETECTED NOT DETECTED Final   Klebsiella oxytoca NOT DETECTED NOT DETECTED Final   Klebsiella pneumoniae NOT DETECTED NOT DETECTED Final   Proteus species NOT DETECTED NOT DETECTED Final   Salmonella species NOT DETECTED NOT DETECTED Final   Serratia marcescens NOT DETECTED NOT DETECTED Final   Haemophilus influenzae NOT DETECTED NOT DETECTED Final   Neisseria meningitidis NOT DETECTED NOT DETECTED Final   Pseudomonas aeruginosa NOT DETECTED NOT DETECTED Final   Stenotrophomonas maltophilia NOT DETECTED NOT DETECTED Final   Candida albicans NOT DETECTED NOT DETECTED Final   Candida auris NOT DETECTED NOT DETECTED Final   Candida glabrata NOT DETECTED NOT DETECTED Final   Candida krusei NOT DETECTED NOT DETECTED Final   Candida parapsilosis NOT DETECTED NOT DETECTED Final   Candida tropicalis NOT DETECTED NOT DETECTED Final   Cryptococcus neoformans/gattii NOT DETECTED NOT DETECTED Final    Comment: Performed at [facility], [address] [place], [place] [address]  Blood Culture (routine x 2)     Status: Abnormal (Preliminary result)   Collection Time: 05/04/21  9:29 PM   Specimen: BLOOD  Result Value Ref Range Status   Specimen Description   Final    BLOOD RIGHT ANTECUBITAL Performed at [facility], [address] [place], [place] [address]    Special Requests   Final    BOTTLES DRAWN AEROBIC AND ANAEROBIC Blood Culture adequate volume Performed at [facility], [address] [place], [place] [address]    Culture  Setup Time   Final    GRAM POSITIVE COCCI IN BOTH AEROBIC AND ANAEROBIC BOTTLES CRITICAL VALUE NOTED.  VALUE IS CONSISTENT WITH PREVIOUSLY REPORTED AND CALLED VALUE. Performed at [facility], [address] [place], [place] [address]    Culture (A)  Final    GROUP A STREP (S.PYOGENES) ISOLATED HEALTH DEPARTMENT NOTIFIED SUSCEPTIBILITIES PERFORMED ON PREVIOUS CULTURE WITHIN THE LAST 5 DAYS. STAPHYLOCOCCUS EPIDERMIDIS THE SIGNIFICANCE OF ISOLATING THIS ORGANISM FROM A SINGLE SET OF BLOOD CULTURES WHEN MULTIPLE SETS ARE DRAWN IS UNCERTAIN. PLEASE  NOTIFY THE MICROBIOLOGY DEPARTMENT WITHIN ONE WEEK IF SPECIATION AND SENSITIVITIES ARE REQUIRED. Performed at [facility], [address] [place], [place] [address]    Report Status PENDING  Incomplete  Resp Panel by RT-PCR (Flu A&B, Covid) Nasopharyngeal Swab     Status: None   Collection Time: 05/04/21  9:50 PM   Specimen: Nasopharyngeal Swab; Nasopharyngeal(NP) swabs in vial transport medium  Result Value Ref Range Status   SARS Coronavirus 2 by RT PCR NEGATIVE NEGATIVE Final    Comment: (NOTE) SARS-CoV-2 target nucleic acids are NOT DETECTED.  The SARS-CoV-2 RNA is generally detectable in upper respiratory specimens during the acute phase of infection. The lowest concentration of SARS-CoV-2 viral copies this assay can detect is 138 copies/mL. A negative result does not preclude SARS-Cov-2 infection and should not be used as the sole basis for treatment or other patient management decisions. A negative result may occur with  improper specimen collection/handling, submission of specimen other than nasopharyngeal swab, presence of viral mutation(s) within the areas targeted by this assay, and inadequate number of viral copies(<138 copies/mL). A negative result must be combined with clinical observations, patient history, and epidemiological information. The expected result is Negative.  Fact Sheet for Patients:  BloggerCourse.com  Fact Sheet for Healthcare Providers:  SeriousBroker.it  This test is no t yet approved or cleared by the [place] FDA and  has been authorized for  detection and/or diagnosis of SARS-CoV-2 by FDA under an Emergency Use Authorization (EUA). This EUA will remain  in effect (meaning this test can be used) for the duration of the COVID-19 declaration under Section 564(b)(1) of the Act, 21 U.S.C.section 360bbb-3(b)(1), unless the authorization is terminated  or revoked sooner.       Influenza A by PCR NEGATIVE NEGATIVE Final   Influenza B by PCR NEGATIVE NEGATIVE Final    Comment: (NOTE) The Xpert Xpress SARS-CoV-2/FLU/RSV plus assay is intended as an aid in the diagnosis of influenza from Nasopharyngeal swab specimens and should not be used as a sole basis for treatment. Nasal washings and aspirates are unacceptable for Xpert Xpress SARS-CoV-2/FLU/RSV testing.  Fact Sheet for Patients: BloggerCourse.com  Fact Sheet for Healthcare Providers: SeriousBroker.it  This test is not yet approved or cleared by the [place] FDA and has been authorized for detection and/or diagnosis of SARS-CoV-2 by FDA under an Emergency Use Authorization (EUA). This EUA will remain in effect (meaning this test can be used) for the duration of the COVID-19 declaration under Section 564(b)(1) of the Act, 21 U.S.C. section 360bbb-3(b)(1), unless the authorization is terminated or revoked.  Performed at [facility], [address] [place], [place] [address]   MRSA Next Gen by PCR, Nasal     Status: None   Collection Time: 05/05/21 11:02 AM   Specimen: Nasal Mucosa; Nasal Swab  Result Value Ref Range Status   MRSA by PCR Next Gen NOT DETECTED NOT DETECTED Final    Comment: (NOTE) The GeneXpert MRSA Assay (FDA approved for NASAL specimens only), is one component of a comprehensive MRSA colonization surveillance program. It is not intended to diagnose MRSA infection nor to guide or monitor treatment for MRSA infections. Test performance is not FDA approved in patients less than 58  years old. Performed at [facility], [address] [place], [place] [address]     Procedures and diagnostic studies:  ECHOCARDIOGRAM COMPLETE BUBBLE STUDY  Result Date: 05/06/2021    ECHOCARDIOGRAM REPORT   Patient Name:  Alexandra Foster Date of Exam: 05/06/2021 Medical Rec #:  [id number]           Height:       65.0 in Accession #:    [id number]          Weight:       272.0 lb Date of Birth:  [birth year]           BSA:          2.254 m Patient Age:    70 years            BP:           93/69 mmHg Patient Gender: F                   HR:           139 bpm. Exam Location:  [facility] Procedure: 2D Echo, Cardiac Doppler, Color Doppler and Saline Contrast Bubble            Study Indications:     Bacteremia 790.7  History:         Patient has no prior history of Echocardiogram examinations.                  COPD; Risk Factors:Hypertension and Diabetes.  Sonographer:     Cristela Blue Referring Phys:  [id number] ADAM ROSS SCHERTZ Diagnosing Phys: Lorine Bears MD  Sonographer Comments: Suboptimal parasternal window and suboptimal apical window. Image acquisition challenging due to COPD. IMPRESSIONS  1. Left ventricular ejection fraction, by estimation, is 50 to 55%. The left ventricle has low normal function. Left ventricular endocardial border not optimally defined to evaluate regional wall motion. There is mild left ventricular hypertrophy. Left ventricular diastolic parameters are indeterminate.  2. Right ventricular systolic function is normal. The right ventricular size is normal. Tricuspid regurgitation signal is inadequate for assessing PA pressure.  3. Left atrial size was mildly dilated.  4. Right atrial size was mildly dilated.  5. The mitral valve is normal in structure. No evidence of mitral valve regurgitation. No evidence of mitral stenosis.  6. The aortic valve is normal in structure. Aortic valve regurgitation is not visualized. Aortic valve sclerosis/calcification is present, without any  evidence of aortic stenosis.  7. challenging image quality. FINDINGS  Left Ventricle: Left ventricular ejection fraction, by estimation, is 50 to 55%. The left ventricle has low normal function. Left ventricular endocardial border not optimally defined to evaluate regional wall motion. The left ventricular internal cavity  size was normal in size. There is mild left ventricular hypertrophy. Left ventricular diastolic parameters are indeterminate. Right Ventricle: The right ventricular size is normal. No increase in right ventricular wall thickness. Right ventricular systolic function is normal. Tricuspid regurgitation signal is inadequate for assessing PA pressure. Left Atrium: Left atrial size was mildly dilated. Right Atrium: Right atrial size was mildly dilated. Pericardium: There is no evidence of pericardial effusion. Mitral Valve: The mitral valve is normal in structure. No evidence of mitral valve regurgitation. No evidence of mitral valve stenosis. Tricuspid Valve: The tricuspid valve is normal in structure. Tricuspid valve regurgitation is not demonstrated. No evidence of tricuspid stenosis. Aortic Valve: The aortic valve is normal in structure. Aortic valve regurgitation is not visualized. Aortic valve sclerosis/calcification is present, without any evidence of aortic stenosis. Aortic valve mean gradient measures 5.0 mmHg. Aortic valve peak  gradient measures 8.9 mmHg. Aortic valve area, by VTI measures 2.75 cm. Pulmonic Valve: The pulmonic valve was normal in  structure. Pulmonic valve regurgitation is not visualized. No evidence of pulmonic stenosis. Aorta: The aortic root is normal in size and structure. Venous: The inferior vena cava was not well visualized. IAS/Shunts: No atrial level shunt detected by color flow Doppler. Agitated saline contrast was given intravenously to evaluate for intracardiac shunting.  LEFT VENTRICLE PLAX 2D LVIDd:         2.80 cm LVIDs:         2.10 cm LV PW:         1.10 cm  LV IVS:        1.05 cm LVOT diam:     2.00 cm LV SV:         45 LV SV Index:   20 LVOT Area:     3.14 cm  RIGHT VENTRICLE RV Basal diam:  4.00 cm RV S prime:     18.70 cm/s LEFT ATRIUM             Index        RIGHT ATRIUM           Index LA diam:        3.00 cm 1.33 cm/m   RA Area:     20.20 cm LA Vol (A2C):   56.4 ml 25.02 ml/m  RA Volume:   61.90 ml  27.46 ml/m LA Vol (A4C):   51.0 ml 22.62 ml/m LA Biplane Vol: 53.5 ml 23.73 ml/m  AORTIC VALVE AV Area (Vmax):    2.40 cm AV Area (Vmean):   2.31 cm AV Area (VTI):     2.75 cm AV Vmax:           149.00 cm/s AV Vmean:          107.000 cm/s AV VTI:            0.164 m AV Peak Grad:      8.9 mmHg AV Mean Grad:      5.0 mmHg LVOT Vmax:         114.00 cm/s LVOT Vmean:        78.700 cm/s LVOT VTI:          0.144 m LVOT/AV VTI ratio: 0.88  AORTA Ao Root diam: 2.83 cm MITRAL VALVE                TRICUSPID VALVE MV Area (PHT): 6.07 cm     TR Peak grad:   13.4 mmHg MV Decel Time: 125 msec     TR Vmax:        183.00 cm/s MV E velocity: 111.00 cm/s                             SHUNTS                             Systemic VTI:  0.14 m                             Systemic Diam: 2.00 cm Lorine Bears MD Electronically signed by Lorine Bears MD Signature Date/Time: 05/06/2021/1:25:12 PM    Final                LOS: 3 days   Arsal Tappan  [facility]   Pager on [email or web address]. If 7PM-7AM, please contact night-coverage at [email or web address]     05/07/2021, 4:17 PM

## 2021-05-08 ENCOUNTER — Encounter: Payer: Self-pay | Admitting: General Surgery

## 2021-05-08 DIAGNOSIS — R7881 Bacteremia: Secondary | ICD-10-CM

## 2021-05-08 DIAGNOSIS — Z978 Presence of other specified devices: Secondary | ICD-10-CM

## 2021-05-08 DIAGNOSIS — A419 Sepsis, unspecified organism: Secondary | ICD-10-CM | POA: Diagnosis not present

## 2021-05-08 DIAGNOSIS — R6521 Severe sepsis with septic shock: Secondary | ICD-10-CM

## 2021-05-08 DIAGNOSIS — D649 Anemia, unspecified: Secondary | ICD-10-CM

## 2021-05-08 DIAGNOSIS — I4891 Unspecified atrial fibrillation: Secondary | ICD-10-CM | POA: Diagnosis not present

## 2021-05-08 DIAGNOSIS — R6520 Severe sepsis without septic shock: Secondary | ICD-10-CM | POA: Diagnosis not present

## 2021-05-08 DIAGNOSIS — E119 Type 2 diabetes mellitus without complications: Secondary | ICD-10-CM

## 2021-05-08 DIAGNOSIS — L03116 Cellulitis of left lower limb: Secondary | ICD-10-CM | POA: Diagnosis not present

## 2021-05-08 DIAGNOSIS — E872 Acidosis, unspecified: Secondary | ICD-10-CM | POA: Diagnosis not present

## 2021-05-08 DIAGNOSIS — B955 Unspecified streptococcus as the cause of diseases classified elsewhere: Secondary | ICD-10-CM

## 2021-05-08 LAB — GLUCOSE, CAPILLARY
Glucose-Capillary: 161 mg/dL — ABNORMAL HIGH (ref 70–99)
Glucose-Capillary: 125 mg/dL — ABNORMAL HIGH (ref 70–99)
Glucose-Capillary: 120 mg/dL — ABNORMAL HIGH (ref 70–99)
Glucose-Capillary: 104 mg/dL — ABNORMAL HIGH (ref 70–99)

## 2021-05-08 LAB — CBC
HCT: 27.7 % — ABNORMAL LOW (ref 36.0–46.0)
Hemoglobin: 9.5 g/dL — ABNORMAL LOW (ref 12.0–15.0)
MCH: 26 pg (ref 26.0–34.0)
MCHC: 34.3 g/dL (ref 30.0–36.0)
MCV: 75.9 fL — ABNORMAL LOW (ref 80.0–100.0)
Platelets: 270 10*3/uL (ref 150–400)
RBC: 3.65 MIL/uL — ABNORMAL LOW (ref 3.87–5.11)
RDW: 16.1 % — ABNORMAL HIGH (ref 11.5–15.5)
WBC: 23.7 10*3/uL — ABNORMAL HIGH (ref 4.0–10.5)
nRBC: 0.7 % — ABNORMAL HIGH (ref 0.0–0.2)

## 2021-05-08 LAB — BASIC METABOLIC PANEL
Anion gap: 13 (ref 5–15)
BUN: 71 mg/dL — ABNORMAL HIGH (ref 8–23)
CO2: 21 mmol/L — ABNORMAL LOW (ref 22–32)
Calcium: 7.5 mg/dL — ABNORMAL LOW (ref 8.9–10.3)
Chloride: 100 mmol/L (ref 98–111)
Creatinine, Ser: 3.48 mg/dL — ABNORMAL HIGH (ref 0.44–1.00)
GFR, Estimated: 13 mL/min — ABNORMAL LOW (ref >60)
Glucose, Bld: 168 mg/dL — ABNORMAL HIGH (ref 70–99)
Potassium: 4.1 mmol/L (ref 3.5–5.1)
Sodium: 134 mmol/L — ABNORMAL LOW (ref 135–145)

## 2021-05-08 LAB — HEPATIC FUNCTION PANEL
ALT: 47 U/L — ABNORMAL HIGH (ref 0–44)
AST: 136 U/L — ABNORMAL HIGH (ref 15–41)
Albumin: <1.5 g/dL — ABNORMAL LOW (ref 3.5–5.0)
Alkaline Phosphatase: 135 U/L — ABNORMAL HIGH (ref 38–126)
Bilirubin, Direct: 2.8 mg/dL — ABNORMAL HIGH (ref 0.0–0.2)
Indirect Bilirubin: 1 mg/dL — ABNORMAL HIGH (ref 0.3–0.9)
Total Bilirubin: 3.8 mg/dL — ABNORMAL HIGH (ref 0.3–1.2)
Total Protein: 4.8 g/dL — ABNORMAL LOW (ref 6.5–8.1)

## 2021-05-08 LAB — BLOOD GAS, ARTERIAL
Acid-base deficit: 5.8 mmol/L — ABNORMAL HIGH (ref 0.0–2.0)
Bicarbonate: 20.7 mmol/L (ref 20.0–28.0)
FIO2: 0.6
MECHVT: 450 mL
Mechanical Rate: 18
O2 Saturation: 96.8 %
PEEP: 5 cmH2O
Patient temperature: 37
pCO2 arterial: 44 mmHg (ref 32.0–48.0)
pH, Arterial: 7.28 — ABNORMAL LOW (ref 7.350–7.450)
pO2, Arterial: 99 mmHg (ref 83.0–108.0)

## 2021-05-08 LAB — TRIGLYCERIDES: Triglycerides: 183 mg/dL — ABNORMAL HIGH (ref <150)

## 2021-05-08 LAB — CULTURE, BLOOD (ROUTINE X 2): Special Requests: ADEQUATE

## 2021-05-08 LAB — PHOSPHORUS: Phosphorus: 7.7 mg/dL — ABNORMAL HIGH (ref 2.5–4.6)

## 2021-05-08 LAB — MAGNESIUM: Magnesium: 2.5 mg/dL — ABNORMAL HIGH (ref 1.7–2.4)

## 2021-05-08 LAB — CORTISOL-AM, BLOOD: Cortisol - AM: 33.9 ug/dL — ABNORMAL HIGH (ref 6.7–22.6)

## 2021-05-08 LAB — CK: Total CK: 1575 U/L — ABNORMAL HIGH (ref 38–234)

## 2021-05-08 MED ORDER — HYDROMORPHONE HCL 1 MG/ML IJ SOLN
0.5000 mg | INTRAMUSCULAR | Status: DC | PRN
  Administered 2021-05-08 – 2021-05-10 (×5): 0.5 mg via INTRAVENOUS
  Filled 2021-05-08 (×6): qty 1

## 2021-05-08 MED ORDER — NOREPINEPHRINE 4 MG/250ML-% IV SOLN
0.0000 ug/min | INTRAVENOUS | Status: DC
  Administered 2021-05-08: 05:00:00 15 ug/min via INTRAVENOUS
  Filled 2021-05-08: qty 250

## 2021-05-08 MED ORDER — LINEZOLID 600 MG/300ML IV SOLN
600.0000 mg | Freq: Two times a day (BID) | INTRAVENOUS | Status: DC
  Administered 2021-05-08 – 2021-05-13 (×12): 600 mg via INTRAVENOUS
  Filled 2021-05-08 (×14): qty 300

## 2021-05-08 MED ORDER — DAKINS (1/4 STRENGTH) 0.125 % EX SOLN
1.0000 "application " | Freq: Every day | CUTANEOUS | Status: AC
  Administered 2021-05-08 – 2021-05-09 (×2): 1
  Filled 2021-05-08 (×2): qty 473

## 2021-05-08 MED ORDER — NOREPINEPHRINE 16 MG/250ML-% IV SOLN
0.0000 ug/min | INTRAVENOUS | Status: DC
  Administered 2021-05-08: 08:00:00 22 ug/min via INTRAVENOUS
  Administered 2021-05-09: 9 ug/min via INTRAVENOUS
  Administered 2021-05-10: 25 ug/min via INTRAVENOUS
  Administered 2021-05-11 (×2): 40 ug/min via INTRAVENOUS
  Administered 2021-05-11: 32 ug/min via INTRAVENOUS
  Administered 2021-05-12: 26 ug/min via INTRAVENOUS
  Filled 2021-05-08 (×9): qty 250

## 2021-05-08 NOTE — Progress Notes (Signed)
Progress Note  Patient Name: Alexandra Foster Date of Encounter: 05/08/2021  Primary Cardiologist: Kathlyn Sacramento, MD  Subjective   Pt currently intubated following excisional debridement of left lower leg overnight.  Sedation off and CCM planning to extubate this AM.  Inpatient Medications    Scheduled Meds:  amiodarone  150 mg Intravenous Once   aspirin EC  81 mg Oral Daily   atorvastatin  40 mg Oral Daily   Chlorhexidine Gluconate Cloth  6 each Topical Q0600   docusate  100 mg Per Tube BID   insulin aspart  0-5 Units Subcutaneous QHS   insulin aspart  0-9 Units Subcutaneous TID WC   midodrine  10 mg Oral TID WC   mometasone-formoterol  2 puff Inhalation BID   montelukast  10 mg Oral QHS   multivitamin with minerals  1 tablet Oral Daily   pantoprazole  40 mg Oral Daily   polyethylene glycol  17 g Per Tube Daily   Ensure Max Protein  11 oz Oral BID   umeclidinium bromide  1 puff Inhalation Daily   Continuous Infusions:  sodium chloride Stopped (05/05/21 0816)   amiodarone 30 mg/hr (05/08/21 0035)   clindamycin (CLEOCIN) IV 900 mg (05/08/21 0612)   norepinephrine (LEVOPHED) Adult infusion 22 mcg/min (05/08/21 0817)   pencillin G potassium IV 3 Million Units (05/08/21 0909)   PRN Meds: docusate sodium, HYDROmorphone (DILAUDID) injection, oxyCODONE, polyethylene glycol   Vital Signs    Vitals:   05/08/21 0830 05/08/21 0900 05/08/21 0930 05/08/21 0944  BP: (!) 88/51 110/61 133/60   Pulse: 91 90 97   Resp: 19 (!) 21 (!) 24   Temp:      TempSrc:      SpO2: 95% 97% 99% 98%  Weight:      Height:        Intake/Output Summary (Last 24 hours) at 05/08/2021 1145 Last data filed at 05/07/2021 2240 Gross per 24 hour  Intake 1270 ml  Output 400 ml  Net 870 ml   Filed Weights   05/04/21 1941 05/05/21 1100  Weight: 120.7 kg 123.4 kg    Physical Exam   GEN: Obese, intubated.  Occas opens eyes. HEENT: Grossly normal.  Neck: Supple, obese, difficult to gauge  JVP.  No carotid bruits, or masses. Cardiac: RRR, no murmurs, rubs, or gallops. No clubbing, cyanosis.  Dsg to left lower leg - edema noted.  Radials 2+, DP/PT 1+ and equal bilaterally.  Respiratory:  Respirations regular and unlabored, coarse/ventilated breath sounds. GI: Soft, nontender, nondistended, BS + x 4. MS: no deformity or atrophy. Skin: warm and dry, no rash. Neuro:  Pt coming off of sedation - minimally responsive. Psych: minimally responsive.  Labs    Chemistry Recent Labs  Lab 05/04/21 1943 05/05/21 0454 05/06/21 0325 05/07/21 0821 05/08/21 0450  NA 141   < > 138 134* 134*  K 2.9*   < > 4.2 3.4* 4.1  CL 99   < > 104 98 100  CO2 26   < > 23 22 21*  GLUCOSE 144*   < > 189* 148* 168*  BUN 44*   < > 60* 69* 71*  CREATININE 3.47*   < > 3.46* 3.32* 3.48*  CALCIUM 8.7*   < > 7.4* 7.0* 7.5*  PROT 7.9  --   --   --   --   ALBUMIN 2.8*  --   --   --   --   AST 67*  --   --   --   --  ALT 48*  --   --   --   --   ALKPHOS 103  --   --   --   --   BILITOT 1.1  --   --   --   --   GFRNONAA 13*   < > 13* 14* 13*  ANIONGAP 16*   < > 11 14 13    < > = values in this interval not displayed.     Hematology Recent Labs  Lab 05/06/21 0325 05/07/21 0821 05/08/21 0450  WBC 16.2* 21.9* 23.7*  RBC 4.40 4.08 3.65*  HGB 10.5* 9.7* 9.5*  HCT 33.4* 29.9* 27.7*  MCV 75.9* 73.3* 75.9*  MCH 23.9* 23.8* 26.0  MCHC 31.4 32.4 34.3  RDW 14.8 14.8 16.1*  PLT 240 294 270   DDimer  Recent Labs  Lab 05/05/21 0027  DDIMER 2.22*     Lipids  Lab Results  Component Value Date   CHOL 106 02/11/2021   HDL 30 (L) 02/11/2021   LDLCALC 62 02/11/2021   TRIG 183 (H) 05/08/2021   CHOLHDL 3.5 02/11/2021    HbA1c  Lab Results  Component Value Date   HGBA1C 6.7 (H) 05/05/2021    Radiology    DG Chest 1 View  Result Date: 05/07/2021 CLINICAL DATA:  Endotracheally intubated. EXAM: CHEST  1 VIEW COMPARISON:  Chest radiograph earlier today. FINDINGS: Endotracheal tube tip is 4 cm  from the carina at the level of the clavicular heads. Right internal jugular central line tip in the region of the atrial caval junction. Cardiomegaly again seen. No pneumothorax. Lung volumes remain low without confluent consolidation. No significant pleural effusion. IMPRESSION: 1. Endotracheal tube tip 4 cm from the carina at the level of the clavicular heads. 2. Right central line tip at the atrial caval junction. 3. Stable cardiomegaly. Low lung volumes. Electronically Signed   By: Keith Rake M.D.   On: 05/07/2021 23:56   CT Tibia Fibula Left Wo Contrast  Result Date: 05/04/2021 CLINICAL DATA:  Concern for soft tissue infection. EXAM: CT OF THE LOWER LEFT EXTREMITY WITHOUT CONTRAST TECHNIQUE: Multidetector CT imaging of the lower left extremity was performed according to the standard protocol. RADIATION DOSE REDUCTION: This exam was performed according to the departmental dose-optimization program which includes automated exposure control, adjustment of the mA and/or kV according to patient size and/or use of iterative reconstruction technique. COMPARISON:  None. FINDINGS: Bones/Joint/Cartilage No acute fracture or dislocation. There is moderate arthritic changes of the knee with tricompartmental narrowing. Ligaments Suboptimally assessed by CT. Muscles and Tendons No acute findings. Soft tissues Diffuse subcutaneous edema may represent cellulitis. No drainable fluid collection/abscess. No soft tissue gas. IMPRESSION: Diffuse subcutaneous edema may represent cellulitis. No drainable fluid collection/abscess. Electronically Signed   By: Anner Crete M.D.   On: 05/04/2021 22:22   US Venous Img Lower Unilateral Left  Result Date: 05/04/2021 CLINICAL DATA:  Left lower extremity pain. EXAM: Left LOWER EXTREMITY VENOUS DOPPLER ULTRASOUND TECHNIQUE: Gray-scale sonography with compression, as well as color and duplex ultrasound, were performed to evaluate the deep venous system(s) from the level of  the common femoral vein through the popliteal and proximal calf veins. COMPARISON:  None. FINDINGS: VENOUS Normal compressibility of the common femoral, superficial femoral, and popliteal veins, as well as the visualized calf veins. Visualized portions of profunda femoral vein and great saphenous vein unremarkable. No filling defects to suggest DVT on grayscale or color Doppler imaging. Doppler waveforms show normal direction of venous flow, normal respiratory  plasticity and response to augmentation. Limited views of the contralateral common femoral vein are unremarkable. OTHER None. Limitations: none IMPRESSION: Negative. Electronically Signed   By: Anner Crete M.D.   On: 05/04/2021 23:39   DG Chest Port 1 View  Result Date: 05/07/2021 CLINICAL DATA:  Central line placement. EXAM: PORTABLE CHEST 1 VIEW COMPARISON:  05/04/2021 FINDINGS: A new right jugular central venous catheter seen with tip overlying the mid right atrium. No evidence of pneumothorax. Moderate cardiomegaly is again noted. Patient is partially rotated to the right. Low lung volumes are again seen, however there is no evidence of pulmonary consolidation or pleural effusion. IMPRESSION: New right jugular central venous catheter tip overlies the mid right atrium. No evidence of pneumothorax. Moderate cardiomegaly and low lung volumes. Electronically Signed   By: Marlaine Hind M.D.   On: 05/07/2021 20:18   DG Chest Port 1 View  Result Date: 05/04/2021 CLINICAL DATA:  Questionable sepsis. EXAM: PORTABLE CHEST 1 VIEW COMPARISON:  05/17/2019 FINDINGS: Cardiomegaly. No confluent airspace opacity, effusions or edema. No acute bony abnormality. IMPRESSION: No active disease. Electronically Signed   By: Rolm Baptise M.D.   On: 05/04/2021 21:03   Telemetry    Suspect ectopic atrial rhythm is setting of very little HR variability - 95-98 w/ brief elevation into 1-teens earlier this AM - Personally Reviewed  ECG    RSR vs Ectopic atrial  rhythm, 97, no acute ST/T changes - Personally Reviewed  Cardiac Studies   2D Echocardiogram 01.23.2023   1. Left ventricular ejection fraction, by estimation, is 50 to 55%. The  left ventricle has low normal function. Left ventricular endocardial  border not optimally defined to evaluate regional wall motion. There is  mild left ventricular hypertrophy. Left  ventricular diastolic parameters are indeterminate.   2. Right ventricular systolic function is normal. The right ventricular  size is normal. Tricuspid regurgitation signal is inadequate for assessing  PA pressure.   3. Left atrial size was mildly dilated.   4. Right atrial size was mildly dilated.   5. The mitral valve is normal in structure. No evidence of mitral valve  regurgitation. No evidence of mitral stenosis.   6. The aortic valve is normal in structure. Aortic valve regurgitation is  not visualized. Aortic valve sclerosis/calcification is present, without  any evidence of aortic stenosis.   7. challenging image quality.  _____________   Patient Profile     71 y.o. female with a history of asthma/COPD, HTN, DMII, obesity, OSA on CPAP, GERD, and COVID-19 pneumonia, who was admitted 1/22 w/ L LE cellulitis, septic shock, S pyogenes bacteremia, hypotension, and AKI.  Developed rapid Afib 1/23  Converted to sinus vs ectopic atrial rhythm early on 1/24 on amio. S/p debridement of LLE.  Assessment & Plan    1.  Afib w/ RVR/Ectopic atrial rhythm:  occurred early on 1/23 in the setting of admission for  L LE cellulitis, septic shock, S pyogenes bacteremia, hypotension, and AKI.  Converted to sinus vs ectopic atrial rhythm early on 1/24 and HR has been relatively steady between 95-98 since.  12 lead this AM likely show ectopic atrial rhythm.  Cont IV amio for the time being.  She is currently intubated, but can consider transition to oral amio once taking POs.  Hopefully will not require long term amio, however BPs previously too  soft for ? blocker or ccb, and she is back on norepi.  Heparin currently on hold in setting of anemia 1/24 and surgery  overnight.  CHA2DS2VASc = 4.  With high risk of recurrent arrhythmia, will likely need [place] once feasible.  2.  Septic shock/hypotension/L LE cellulitis/S pyogenes bacteremia:  s/p debridement of L lower leg overnight.  Abx/vasopressor rx per CCM.  3.  AKI/ATN:  in setting of above.  Creat slow to improve.  4.  DMII:  per IM.  5.  Normocytic anemia:  s/p PRBC's in post-op setting.  Heparin off. Follow.  Signed, Murray Hodgkins, NP  05/08/2021, 11:45 AM    For questions or updates, please contact   Please consult [email or web address] for contact info under Cardiology/STEMI.

## 2021-05-08 NOTE — Progress Notes (Signed)
Patient extubated this am. Lethargic but able to be aroused. Patient intermit confused to date and situation. Foley intact with adequate urine output. Surgeon in to change patients dressing. Dressing has been re-enforced this evening. Npo. Family at bedside continue to monitor.

## 2021-05-08 NOTE — Progress Notes (Signed)
Late entry:  Patient arrived on the unit per OR team, report given at bedside. Left lower extremity assessed with MD and nurse. Vital signs on monitor as follows: HR-99, BP-101/58, Temp-98.0, and 94% oxygenation on ventilator. 2nd unit of emergency PRBC verified by 2 RN and started. PRBC info-[id number], O-Pos expiration date 05/28/2021 @ 2359. Blood bank personnel called to inform blood crossmatch was complete and results were negative. Dressing reinforced due to increased drainage.   Family later updated and at bedside. No questions or concerns at this time. Family will return in the a.m.

## 2021-05-08 NOTE — Progress Notes (Signed)
Patient ID: Alexandra Foster, female   DOB: 1950/09/23, 71 y.o.   MRN: [id number]     [place] [facility] Day(s): 4.   Interval History: Patient seen and examined, no acute events or new complaints overnight.  Patient was able to be extubated this morning.  Still on Levophed blood trying to wean it off.  Patient still lethargic responding to pain and simple questions.  Vital signs in last 24 hours: [min-max] current  Temp:  [98 F (36.7 C)-100 F (37.8 C)] 99 F (37.2 C) (01/25 1500) Pulse Rate:  [90-102] 91 (01/25 1530) Resp:  [10-27] 10 (01/25 1530) BP: (80-133)/(41-92) 113/61 (01/25 1530) SpO2:  [93 %-100 %] 95 % (01/25 1530) FiO2 (%):  [40 %-70 %] 40 % (01/25 0735)     Height: 5\' 5"  (165.1 cm) Weight: 123.4 kg BMI (Calculated): 45.27   Physical Exam:  Constitutional: alert, cooperative and no distress  Respiratory: breathing non-labored at rest  Cardiovascular: regular rate and sinus rhythm  Gastrointestinal: soft, non-tender, and non-distended Extremity: No purulence or gross necrotic tissue.  Exposed muscle bone and tendon.       Labs:  CBC Latest Ref Rng & Units 05/08/2021 05/07/2021 05/06/2021  WBC 4.0 - 10.5 K/uL 23.7(H) 21.9(H) 16.2(H)  Hemoglobin 12.0 - 15.0 g/dL 9.5(L) 9.7(L) 10.5(L)  Hematocrit 36.0 - 46.0 % 27.7(L) 29.9(L) 33.4(L)  Platelets 150 - 400 K/uL 270 294 240   CMP Latest Ref Rng & Units 05/08/2021 05/07/2021 05/06/2021  Glucose 70 - 99 mg/dL 168(H) 148(H) 189(H)  BUN 8 - 23 mg/dL 71(H) 69(H) 60(H)  Creatinine 0.44 - 1.00 mg/dL 3.48(H) 3.32(H) 3.46(H)  Sodium 135 - 145 mmol/L 134(L) 134(L) 138  Potassium 3.5 - 5.1 mmol/L 4.1 3.4(L) 4.2  Chloride 98 - 111 mmol/L 100 98 104  CO2 22 - 32 mmol/L 21(L) 22 23  Calcium 8.9 - 10.3 mg/dL 7.5(L) 7.0(L) 7.4(L)  Total Protein 6.5 - 8.1 g/dL 4.8(L) - -  Total Bilirubin 0.3 - 1.2 mg/dL 3.8(H) - -  Alkaline Phos 38 - 126 U/L 135(H) - -  AST 15 - 41 U/L 136(H) - -  ALT 0 - 44 U/L 47(H) - -     Imaging studies: No new pertinent imaging studies   Assessment/Plan:  71 y.o. female with left leg necrotizing fasciitis with 1 Day Post-Op s/p debridement, complicated by pertinent comorbidities including strep pyogenes bacteremia, COPD, type 2 diabetes mellitus, hypertension, obstructive sleep apnea, GERD  Left leg necrotizing fasciitis -S/p debridement 05/07/2021 -I personally changed the wound and apply wet-to-dry Dakin's solution dressing. -There was a viable muscular tissue.  There was no purulent or necrotic tissue at this moment. -I will continue with bedside dressing changes until better demarcation of skin or other tissue for ischemia.  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 -I will continue to follow closely.   Arnold Long, MD

## 2021-05-08 NOTE — Progress Notes (Signed)
GOALS OF CARE DISCUSSION  The Clinical status was relayed to family in detail. Family at bedside  Updated and notified of patients medical condition.   Explained to family course of therapy and the modalities    Patient with Progressive multiorgan failure with a very high probablity of a very minimal chance of meaningful recovery despite all aggressive and optimal medical therapy.  PATIENT REMAINS FULL CODE  Family understands the situation. Plan for trial of extubation today   Family are satisfied with Plan of action and management. All questions answered  Additional CC time 20 mins   Alexandra Foster Patricia Pesa, M.D.  [facility]  Medical Director [place] Director [facility] Cardio-Pulmonary Department

## 2021-05-08 NOTE — Progress Notes (Signed)
NAME:  Alexandra Foster, MRN:  [id number], DOB:  [birth year], LOS: 4 ADMISSION DATE:  05/04/2021   BRIEF SYNOPSIS 71 y.o female with medical history significant of Asthma/COPD, T2DM, HTN, OSA on CPAP, GERD, COVID-19 pneumonia, and Bilateral lower extremity edema who presented to the ED with chief complaints of LLE pain and chills since Thursday.   Patient given 30 cc/kg of fluids and started on broad-spectrum antibiotics for sepsis with septic shock. Patient remained hypotensive despite IVF boluses therefore was started on Levophed. PCCM consulted.   Past Medical History     Arthritis   Asthma   COPD (chronic obstructive pulmonary disease) ([place])   Diabetes mellitus without complication ([place])   Endometriosis   GERD (gastroesophageal reflux disease)   Hypertension   Obesity   Sleep apnea    CPAP    Significant Hospital Events   05/04/21: Admitted to the ICU with severe sepsis with shock secondary to cellulitis of LLE 1/23 off pressors +Afib with RVR 1/24 blood pressure low  1/24 to OR for fasciotomy 1/25 post op resp failure with severe septic shock   Significant Diagnostic Tests:  1/21: Chest Xray> no active cardiopulmonary process 1/21: CT left lower leg>Diffuse subcutaneous edema may represent cellulitis. No drainable fluid collection/abscess 1/21: Ultrasound lower unilateral left> no DVT   Micro Data:  1/21: SARS-CoV-2 PCR> negative 1/21: Influenza PCR> negative 1/21: Blood culture x2> 1/21: Urine Culture> 1/21: MRSA PCR>>    Antimicrobials:  Vancomycin 1/21> Cefepime 1/21> Ceftriaxone 1/21 X1 Micro Data:  1/15 COVID NEG 1/21 strep pyogenes bacteremia      Interim History / Subjective:  Patient with acute ned fasciitis with emergent fasciotomy Critically ill with multiorgan failure Prognosis is poor      Objective   Blood pressure (!) 80/41, pulse 94, temperature 98.7 F (37.1 C), temperature source Oral, resp. rate 17, height _0  (1.651 m),  weight 123.4 kg, SpO2 97 %.    Vent Mode: PRVC FiO2 (%):  [60 %-70 %] 60 % Set Rate:  [18 bmp] 18 bmp Vt Set:  [450 mL] 450 mL PEEP:  [5 cmH20] 5 cmH20   Intake/Output Summary (Last 24 hours) at 05/08/2021 0720 Last data filed at 05/07/2021 2240 Gross per 24 hour  Intake 1390 ml  Output 400 ml  Net 990 ml    Filed Weights   05/04/21 1941 05/05/21 1100  Weight: 120.7 kg 123.4 kg    REVIEW OF SYSTEMS  PATIENT IS UNABLE TO PROVIDE COMPLETE REVIEW OF SYSTEMS DUE TO SEVERE CRITICAL ILLNESS AND TOXIC METABOLIC ENCEPHALOPATHY    PHYSICAL EXAMINATION:  GENERAL:critically ill appearing, +resp distress EYES: Pupils equal, round, reactive to light.  No scleral icterus.  MOUTH: Moist mucosal membrane. INTUBATED NECK: Supple.  PULMONARY: +rhonchi, +wheezing CARDIOVASCULAR: S1 and S2.  No murmurs  GASTROINTESTINAL: Soft, nontender, -distended. Positive bowel sounds.  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 with post op resp failure with severe septic shock  Severe ACUTE Hypoxic and Hypercapnic Respiratory Failure -continue Mechanical Ventilator support -Wean Fio2 and PEEP as tolerated -VAP/VENT bundle implementation - Wean PEEP & FiO2 as  tolerated, maintain SpO2 > 88% - Head of bed elevated 30 degrees, VAP protocol in place - Plateau pressures less than 30 cm H20  - Intermittent chest x-ray & ABG PRN - Ensure adequate pulmonary hygiene  -will NOT perform SAT/SBT when respiratory parameters are met   SEPTIC shock SOURCE-LLE -use vasopressors to keep MAP>65 as  needed -follow ABG and LA as needed -follow up cultures -emperic ABX - stress dose steroids -  CARDIAC new onset afib wth RVR ICU monitoring  amio infusion and heparin infusion   ACUTE KIDNEY INJURY/Renal Failure -continue Foley Catheter-assess need -Avoid nephrotoxic agents -Follow urine output, BMP -Ensure adequate renal perfusion, optimize oxygenation -Renal dose medications   Intake/Output Summary (Last 24 hours) at 05/08/2021 0727 Last data filed at 05/07/2021 2240 Gross per 24 hour  Intake 1390 ml  Output 400 ml  Net 990 ml    BMP Latest Ref Rng & Units 05/08/2021 05/07/2021 05/06/2021  Glucose 70 - 99 mg/dL 168(H) 148(H) 189(H)  BUN 8 - 23 mg/dL 71(H) 69(H) 60(H)  Creatinine 0.44 - 1.00 mg/dL 3.48(H) 3.32(H) 3.46(H)  BUN/Creat Ratio 6 - 22 (calc) - - -  Sodium 135 - 145 mmol/L 134(L) 134(L) 138  Potassium 3.5 - 5.1 mmol/L 4.1 3.4(L) 4.2  Chloride 98 - 111 mmol/L 100 98 104  CO2 22 - 32 mmol/L 21(L) 22 23  Calcium 8.9 - 10.3 mg/dL 7.5(L) 7.0(L) 7.4(L)    INFECTIOUS DISEASE -continue antibiotics as prescribed -follow up cultures -follow up ID consultation   ENDO - ICU hypoglycemic\Hyperglycemia protocol -check FSBS per protocol   GI GI PROPHYLAXIS as indicated  NUTRITIONAL STATUS DIET-->TF's as tolerated Constipation protocol as indicated   ELECTROLYTES -follow labs as needed -replace as needed -pharmacy consultation and following   ACUTE ANEMIA- TRANSFUSE AS NEEDED CONSIDER TRANSFUSION  IF HGB<7 DVT PRX with TED/SCD's ONLY     Best practice:  Diet:  Oral Pain/Anxiety/Delirium protocol (if indicated): No VAP protocol (if indicated): Not indicated DVT prophylaxis: Subcutaneous Heparin GI prophylaxis: PPI Glucose control:  SSI Yes Central venous access:  N/A Arterial line:  N/A Foley:  N/A Mobility:  bed rest  PT consulted: N/A Last date of multidisciplinary goals of care discussion [1/21] Code Status:  full code Disposition:  ICU   Labs   CBC: Recent Labs  Lab 05/04/21 1943 05/05/21 0454 05/06/21 0325 05/07/21 0821 05/08/21 0450  WBC 16.5* 17.6* 16.2* 21.9* 23.7*  NEUTROABS 15.5*  --   --   --   --   HGB 12.5 10.8* 10.5* 9.7* 9.5*  HCT 40.8 35.7* 33.4* 29.9* 27.7*  MCV 78.5* 77.9* 75.9* 73.3* 75.9*  PLT 282 228 240 294 270     Basic Metabolic Panel: Recent Labs  Lab 05/05/21 0454 05/05/21 1153 05/05/21 1737 05/06/21 0325 05/07/21 0821 05/08/21 0450  NA 140  --  139 138 134* 134*  K 3.3*  --  3.8 4.2 3.4* 4.1  CL 106  --  105 104 98 100  CO2 21*  --  18* 23 22 21*  GLUCOSE 114*  --  166* 189* 148* 168*  BUN 48*  --  54* 60* 69* 71*  CREATININE 3.30*  --  3.43* 3.46* 3.32* 3.48*  CALCIUM 7.8*  --  7.7* 7.4* 7.0* 7.5*  MG 1.5* 2.2  --  2.4 2.5* 2.5*  PHOS 2.8 4.5  --  4.4 4.5 7.7*    GFR: Estimated Creatinine Clearance: 19.3 mL/min (A) (by C-G formula based on SCr of 3.48 mg/dL (  H)). Recent Labs  Lab 05/04/21 2130 05/04/21 2150 05/05/21 0454 05/05/21 1153 05/05/21 1737 05/05/21 2017 05/06/21 0325 05/07/21 0821 05/07/21 1046 05/08/21 0450  PROCALCITON 35.76  --  40.92  --   --   --  66.16  --   --   --   WBC  --   --  17.6*  --   --   --  16.2* 21.9*  --  23.7*  LATICACIDVEN  --    < >  --    < > 4.1* 2.9*  --  1.7 1.7  --    < > = values in this interval not displayed.     Liver Function Tests: Recent Labs  Lab 05/04/21 1943  AST 67*  ALT 48*  ALKPHOS 103  BILITOT 1.1  PROT 7.9  ALBUMIN 2.8*    Coagulation Profile: Recent Labs  Lab 05/05/21 0027  INR 1.3*     Cardiac Enzymes: No results for input(s): CKTOTAL, CKMB, CKMBINDEX, TROPONINI in the last 168 hours.  HbA1C: Hgb A1c MFr Bld  Date/Time Value Ref Range Status  05/05/2021 04:53 AM 6.7 (H) 4.8 - 5.6 % Final    Comment:    (NOTE)         Prediabetes: 5.7 - 6.4         Diabetes: >6.4         Glycemic control for adults with diabetes: <7.0   02/11/2021 09:41 AM 6.3 (H) <5.7 % of total Hgb Final     Comment:    For someone without known diabetes, a hemoglobin  A1c value between 5.7% and 6.4% is consistent with prediabetes and should be confirmed with a  follow-up test. . For someone with known diabetes, a value <7% indicates that their diabetes is well controlled. A1c targets should be individualized based on duration of diabetes, age, comorbid conditions, and other considerations. . This assay result is consistent with an increased risk of diabetes. . Currently, no consensus exists regarding use of hemoglobin A1c for diagnosis of diabetes for children. .     CBG: Recent Labs  Lab 05/06/21 1555 05/06/21 2119 05/07/21 0738 05/07/21 1135 05/07/21 1550  GLUCAP 153* 145* 142* 148* 111*    No Known Allergies   Home Medications  Prior to Admission medications   Medication Sig Start Date End Date Taking? Authorizing Provider  albuterol (PROVENTIL) (2.5 MG/3ML) 0.083% nebulizer solution Take 3 mLs (2.5 mg total) by nebulization every 6 (six) hours as needed for wheezing or shortness of breath. 10/03/19  Yes Sowles, Drue Stager, MD  aspirin 81 MG tablet Take 1 tablet (81 mg total) by mouth daily. 01/08/16  Yes Sowles, Drue Stager, MD  atorvastatin (LIPITOR) 40 MG tablet Take 1 tablet (40 mg total) by mouth daily. 02/11/21  Yes Sowles, Drue Stager, MD  azelastine (ASTELIN) 0.1 % nasal spray Place 2 sprays into both nostrils 2 (two) times daily as needed for rhinitis or allergies. 07/31/20  Yes Delsa Grana, PA-C  baclofen (LIORESAL) 10 MG tablet Take 10 mg by mouth daily. 04/16/21  Yes [provider]  celecoxib (CELEBREX) 200 MG capsule Take 200 mg by mouth daily. 04/03/21  Yes [provider]  Cetirizine HCl (ZYRTEC ALLERGY) 10 MG TBDP Take 10 mg by mouth at bedtime. For sinus symptoms and seasonal allergies 07/31/20  Yes Delsa Grana, PA-C  docusate sodium (COLACE) 100 MG capsule Take 100 mg by mouth daily.   Yes [provider]  fluticasone (FLONASE) 50  MCG/ACT nasal spray  08/31/20  Yes [provider]  glucose blood (ACCU-CHEK AVIVA PLUS) test strip Use as instructed 12/08/17  Yes Sowles, Drue Stager, MD  Lancets (ACCU-CHEK SOFT TOUCH) lancets Use as instructed 12/08/17  Yes Sowles, Drue Stager, MD  meloxicam (MOBIC) 15 MG tablet Take 15 mg by mouth daily. 04/23/21  Yes [provider]  metFORMIN (GLUCOPHAGE-XR) 750 MG 24 hr tablet Take 1 tablet (750 mg total) by mouth daily with breakfast. 02/11/21  Yes Sowles, Drue Stager, MD  montelukast (SINGULAIR) 10 MG tablet Take 1 tablet (10 mg total) by mouth at bedtime. 02/11/21  Yes Sowles, Drue Stager, MD  Multiple Vitamins-Minerals (MULTIVITAL PO) Take 1 tablet by mouth daily.   Yes [provider]  omeprazole (PRILOSEC) 20 MG capsule Take 1 capsule (20 mg total) by mouth daily. 02/11/21  Yes Steele Sizer, MD  [place] 30 MG/2ML SOSY  10/09/20  Yes [provider]  triamterene-hydrochlorothiazide (DYAZIDE) 37.5-25 MG capsule TAKE 1 CAPSULE EVERY DAY 05/02/21  Yes Sowles, Drue Stager, MD  vitamin B-12 (CYANOCOBALAMIN) 100 MCG tablet Take 100 mcg by mouth daily.   Yes [provider]  calcium carbonate (OSCAL) 1500 (600 Ca) MG TABS tablet Take 600 mg of elemental calcium by mouth 2 (two) times daily with a meal. Patient not taking: Reported on 05/05/2021    [provider]  Fluticasone-Umeclidin-Vilant (TRELEGY ELLIPTA) 100-62.5-25 MCG/INH AEPB Inhale 1 puff into the lungs daily. In place of Stiolto 02/10/20   Steele Sizer, MD  loratadine (CLARITIN) 10 MG tablet Take 1 tablet (10 mg total) by mouth daily. 02/11/21   Steele Sizer, MD       DVT/GI PRX  assessed I Assessed the need for Labs I Assessed the need for Foley I Assessed the need for Central Venous Line Family Discussion when available I Assessed the need for Mobilization I made an Assessment of medications to be adjusted accordingly Safety Risk assessment completed  CASE DISCUSSED IN  MULTIDISCIPLINARY ROUNDS WITH ICU TEAM     Critical Care Time devoted to patient care services described in this note is 65 minutes.  Critical care was necessary to treat /prevent imminent and life-threatening deterioration. Overall, patient is critically ill, prognosis is guarded.  Patient with Multiorgan failure and at high risk for cardiac arrest and death.    Corrin Parker, M.D.  [facility]  Medical Director [place] Director [facility] Cardio-Pulmonary Department

## 2021-05-08 NOTE — Procedures (Signed)
Extubation Procedure Note  Patient Details:   Name: Alexandra Foster DOB: Aug 15, 1950 MRN: [id number]   Airway Documentation:  Airway (Active)   Vent end date: 05/08/21 Vent end time: 0950   Evaluation  O2 sats: stable throughout Complications: No apparent complications Patient did tolerate procedure well. Bilateral Breath Sounds: Rhonchi   Yes.  PT extubated to 4L Nasal Canula. Cuff leak present.  Able to cough and speak.  Conni Slipper 05/08/2021, 9:56 AM

## 2021-05-08 NOTE — Consult Note (Signed)
PHARMACY CONSULT NOTE - FOLLOW UP  Pharmacy Consult for Electrolyte Monitoring and Replacement   Recent Labs: Potassium (mmol/L)  Date Value  05/08/2021 4.1  08/11/2013 3.3 (L)   Magnesium (mg/dL)  Date Value  05/08/2021 2.5 (H)   Calcium (mg/dL)  Date Value  05/08/2021 7.5 (L)   Calcium, Total (mg/dL)  Date Value  08/11/2013 9.2   Albumin (g/dL)  Date Value  05/04/2021 2.8 (L)  08/11/2013 3.1 (L)   Phosphorus (mg/dL)  Date Value  05/08/2021 7.7 (H)   Sodium (mmol/L)  Date Value  05/08/2021 134 (L)  08/11/2013 138     Assessment: Pharmacy has been consulted to monitor and replace electrolytes in 71yo patient with severe sepsis and strep pyogenes bacteremia.    Goal of Therapy:  Electrolytes WNL  Plan:  --No replacement indicated at this time --Continue to follow along  Tawnya Crook, PharmD, BCPS Clinical Pharmacist 05/08/2021 12:12 PM

## 2021-05-08 NOTE — Progress Notes (Signed)
Date of Admission:  05/04/2021    ID: Alexandra Foster is a 71 y.o. female  Principal Problem:   Severe sepsis with lactic acidosis (HCC)    Subjective: Pthad extensive debridement to the left leg and necrotizing fascitis tissue  removed Pt got extubated this morning, Still a bit sedated    Medications:   amiodarone  150 mg Intravenous Once   aspirin EC  81 mg Oral Daily   atorvastatin  40 mg Oral Daily   Chlorhexidine Gluconate Cloth  6 each Topical Q0600   docusate  100 mg Per Tube BID   insulin aspart  0-5 Units Subcutaneous QHS   insulin aspart  0-9 Units Subcutaneous TID WC   midodrine  10 mg Oral TID WC   mometasone-formoterol  2 puff Inhalation BID   montelukast  10 mg Oral QHS   multivitamin with minerals  1 tablet Oral Daily   pantoprazole  40 mg Oral Daily   polyethylene glycol  17 g Per Tube Daily   Ensure Max Protein  11 oz Oral BID   umeclidinium bromide  1 puff Inhalation Daily    Objective: Vital signs in last 24 hours:Patient Vitals for the past 24 hrs:  BP Temp Temp src Pulse Resp SpO2  05/08/21 0944 -- -- -- -- -- 98 %  05/08/21 0930 133/60 -- -- 97 (!) 24 99 %  05/08/21 0900 110/61 -- -- 90 (!) 21 97 %  05/08/21 0830 (!) 88/51 -- -- 91 19 95 %  05/08/21 0800 (!) 98/48 -- -- 91 20 95 %  05/08/21 0737 -- -- -- -- -- 97 %  05/08/21 0735 -- -- -- -- -- 100 %  05/08/21 0730 (!) 86/51 -- -- 91 18 97 %  05/08/21 0700 (!) 93/54 98.5 F (36.9 C) -- 92 20 98 %  05/08/21 0400 (!) 80/41 98.7 F (37.1 C) Oral 94 17 97 %  05/07/21 2255 112/67 98 F (36.7 C) Oral (!) 102 18 94 %  05/07/21 2252 -- -- -- -- -- 94 %  05/07/21 2000 (!) 117/92 99.8 F (37.7 C) Oral (!) 102 (!) 27 93 %  05/07/21 1700 -- -- -- 95 (!) 24 94 %  05/07/21 1600 (!) 97/53 100 F (37.8 C) Oral (!) 101 (!) 23 94 %  05/07/21 1500 (!) 76/61 -- -- 89 19 93 %  05/07/21 1400 (!) 95/54 -- -- 90 20 95 %    PHYSICAL EXAM:  General: somnolent Neck:symmetrical, no adenopathy, thyroid: non  tender no carotid bruit and no JVD. Lungs: b/l air entry Heart: s1s2 , sinus tachycardia Abdomen: Soft, non-tender,not distended. Bowel sounds normal. No masses Extremities:left leg  Post debridement 05/08/21     05/07/21   On admission   Skin: No rashes or lesions. Or bruising Lymph: Cervical, supraclavicular normal. Neurologic: cannot be assessed  Lab Results Recent Labs    05/07/21 0821 05/08/21 0450  WBC 21.9* 23.7*  HGB 9.7* 9.5*  HCT 29.9* 27.7*  NA 134* 134*  K 3.4* 4.1  CL 98 100  CO2 22 21*  BUN 69* 71*  CREATININE 3.32* 3.48*   Microbiology: 05/04/21 [facility] Group A strep 4/4 1of 4 staph epidermidis- a contaminant Studies/Results: DG Chest 1 View  Result Date: 05/07/2021 CLINICAL DATA:  Endotracheally intubated. EXAM: CHEST  1 VIEW COMPARISON:  Chest radiograph earlier today. FINDINGS: Endotracheal tube tip is 4 cm from the carina at the level of the clavicular heads. Right internal jugular central  line tip in the region of the atrial caval junction. Cardiomegaly again seen. No pneumothorax. Lung volumes remain low without confluent consolidation. No significant pleural effusion. IMPRESSION: 1. Endotracheal tube tip 4 cm from the carina at the level of the clavicular heads. 2. Right central line tip at the atrial caval junction. 3. Stable cardiomegaly. Low lung volumes. Electronically Signed   By: Alexandra Foster M.D.   On: 05/07/2021 23:56   DG Chest Port 1 View  Result Date: 05/07/2021 CLINICAL DATA:  Central line placement. EXAM: PORTABLE CHEST 1 VIEW COMPARISON:  05/04/2021 FINDINGS: A new right jugular central venous catheter seen with tip overlying the mid right atrium. No evidence of pneumothorax. Moderate cardiomegaly is again noted. Patient is partially rotated to the right. Low lung volumes are again seen, however there is no evidence of pulmonary consolidation or pleural effusion. IMPRESSION: New right jugular central venous catheter tip overlies the mid  right atrium. No evidence of pneumothorax. Moderate cardiomegaly and low lung volumes. Electronically Signed   By: Alexandra Foster M.D.   On: 05/07/2021 20:18   ECHOCARDIOGRAM COMPLETE BUBBLE STUDY  Result Date: 05/06/2021    ECHOCARDIOGRAM REPORT   Patient Name:   Alexandra Foster Date of Exam: 05/06/2021 Medical Rec #:  [id number]           Height:       65.0 in Accession #:    [id number]          Weight:       272.0 lb Date of Birth:  Jul 20, 1950           BSA:          2.254 m Patient Age:    40 years            BP:           93/69 mmHg Patient Gender: F                   HR:           139 bpm. Exam Location:  [facility] Procedure: 2D Echo, Cardiac Doppler, Color Doppler and Saline Contrast Bubble            Study Indications:     Bacteremia 790.7  History:         Patient has no prior history of Echocardiogram examinations.                  COPD; Risk Factors:Hypertension and Diabetes.  Sonographer:     Alexandra Foster Referring Phys:  [id number] Alexandra Foster Diagnosing Phys: Alexandra Sacramento MD  Sonographer Comments: Suboptimal parasternal window and suboptimal apical window. Image acquisition challenging due to COPD. IMPRESSIONS  1. Left ventricular ejection fraction, by estimation, is 50 to 55%. The left ventricle has low normal function. Left ventricular endocardial border not optimally defined to evaluate regional wall motion. There is mild left ventricular hypertrophy. Left ventricular diastolic parameters are indeterminate.  2. Right ventricular systolic function is normal. The right ventricular size is normal. Tricuspid regurgitation signal is inadequate for assessing PA pressure.  3. Left atrial size was mildly dilated.  4. Right atrial size was mildly dilated.  5. The mitral valve is normal in structure. No evidence of mitral valve regurgitation. No evidence of mitral stenosis.  6. The aortic valve is normal in structure. Aortic valve regurgitation is not visualized. Aortic valve sclerosis/calcification is  present, without any evidence of aortic stenosis.  7. challenging image quality. FINDINGS  Left Ventricle: Left ventricular ejection fraction, by estimation, is 50 to 55%. The left ventricle has low normal function. Left ventricular endocardial border not optimally defined to evaluate regional wall motion. The left ventricular internal cavity  size was normal in size. There is mild left ventricular hypertrophy. Left ventricular diastolic parameters are indeterminate. Right Ventricle: The right ventricular size is normal. No increase in right ventricular wall thickness. Right ventricular systolic function is normal. Tricuspid regurgitation signal is inadequate for assessing PA pressure. Left Atrium: Left atrial size was mildly dilated. Right Atrium: Right atrial size was mildly dilated. Pericardium: There is no evidence of pericardial effusion. Mitral Valve: The mitral valve is normal in structure. No evidence of mitral valve regurgitation. No evidence of mitral valve stenosis. Tricuspid Valve: The tricuspid valve is normal in structure. Tricuspid valve regurgitation is not demonstrated. No evidence of tricuspid stenosis. Aortic Valve: The aortic valve is normal in structure. Aortic valve regurgitation is not visualized. Aortic valve sclerosis/calcification is present, without any evidence of aortic stenosis. Aortic valve mean gradient measures 5.0 mmHg. Aortic valve peak  gradient measures 8.9 mmHg. Aortic valve area, by VTI measures 2.75 cm. Pulmonic Valve: The pulmonic valve was normal in structure. Pulmonic valve regurgitation is not visualized. No evidence of pulmonic stenosis. Aorta: The aortic root is normal in size and structure. Venous: The inferior vena cava was not well visualized. IAS/Shunts: No atrial level shunt detected by color flow Doppler. Agitated saline contrast was given intravenously to evaluate for intracardiac shunting.  LEFT VENTRICLE PLAX 2D LVIDd:         2.80 cm LVIDs:         2.10 cm LV  PW:         1.10 cm LV IVS:        1.05 cm LVOT diam:     2.00 cm LV SV:         45 LV SV Index:   20 LVOT Area:     3.14 cm  RIGHT VENTRICLE RV Basal diam:  4.00 cm RV S prime:     18.70 cm/s LEFT ATRIUM             Index        RIGHT ATRIUM           Index LA diam:        3.00 cm 1.33 cm/m   RA Area:     20.20 cm LA Vol (A2C):   56.4 ml 25.02 ml/m  RA Volume:   61.90 ml  27.46 ml/m LA Vol (A4C):   51.0 ml 22.62 ml/m LA Biplane Vol: 53.5 ml 23.73 ml/m  AORTIC VALVE AV Area (Vmax):    2.40 cm AV Area (Vmean):   2.31 cm AV Area (VTI):     2.75 cm AV Vmax:           149.00 cm/s AV Vmean:          107.000 cm/s AV VTI:            0.164 m AV Peak Grad:      8.9 mmHg AV Mean Grad:      5.0 mmHg LVOT Vmax:         114.00 cm/s LVOT Vmean:        78.700 cm/s LVOT VTI:          0.144 m LVOT/AV VTI ratio: 0.88  AORTA Ao Root diam: 2.83 cm MITRAL VALVE  TRICUSPID VALVE MV Area (PHT): 6.07 cm     TR Peak grad:   13.4 mmHg MV Decel Time: 125 msec     TR Vmax:        183.00 cm/s MV E velocity: 111.00 cm/s                             SHUNTS                             Systemic VTI:  0.14 m                             Systemic Diam: 2.00 cm Alexandra Sacramento MD Electronically signed by Alexandra Sacramento MD Signature Date/Time: 05/06/2021/1:25:12 PM    Final      Assessment/Plan:  Streptococcus pyogenes bacteremia with severe sepsis Secondary to left leg necrotizing celluliits>fascitis S/p debridement Pt is currently on penicillin and clindamycin .  2 doses missed. Will Dc clindamycin and change to linezolid to provide antitoxin effect and also to circumvent  potential clindamycin resistance Leucocytosis Pt has been extubated this morning post surgery  Pressor need is decreasing  AKI due to sepsis Discussed with intensivist -- Discussed IVIG risk VS benefit- currently risk outweighs benefit with risk for thrombosis, hypervisocosity and worsening renal function  Anemia  DM on sliding scale Afib now  in sinus rhythm- on Amiodarone  Pt seen with surgeon Discussed the management with family and care team

## 2021-05-09 DIAGNOSIS — I480 Paroxysmal atrial fibrillation: Secondary | ICD-10-CM | POA: Diagnosis not present

## 2021-05-09 DIAGNOSIS — A419 Sepsis, unspecified organism: Secondary | ICD-10-CM | POA: Diagnosis not present

## 2021-05-09 DIAGNOSIS — A408 Other streptococcal sepsis: Secondary | ICD-10-CM

## 2021-05-09 DIAGNOSIS — E119 Type 2 diabetes mellitus without complications: Secondary | ICD-10-CM

## 2021-05-09 DIAGNOSIS — R6520 Severe sepsis without septic shock: Secondary | ICD-10-CM | POA: Diagnosis not present

## 2021-05-09 DIAGNOSIS — N179 Acute kidney failure, unspecified: Secondary | ICD-10-CM | POA: Diagnosis not present

## 2021-05-09 DIAGNOSIS — L03116 Cellulitis of left lower limb: Secondary | ICD-10-CM | POA: Diagnosis not present

## 2021-05-09 DIAGNOSIS — D649 Anemia, unspecified: Secondary | ICD-10-CM

## 2021-05-09 LAB — GLUCOSE, CAPILLARY
Glucose-Capillary: 119 mg/dL — ABNORMAL HIGH (ref 70–99)
Glucose-Capillary: 124 mg/dL — ABNORMAL HIGH (ref 70–99)
Glucose-Capillary: 111 mg/dL — ABNORMAL HIGH (ref 70–99)
Glucose-Capillary: 128 mg/dL — ABNORMAL HIGH (ref 70–99)

## 2021-05-09 LAB — PHOSPHORUS: Phosphorus: 7.8 mg/dL — ABNORMAL HIGH (ref 2.5–4.6)

## 2021-05-09 LAB — CBC
HCT: 23 % — ABNORMAL LOW (ref 36.0–46.0)
Hemoglobin: 8.1 g/dL — ABNORMAL LOW (ref 12.0–15.0)
MCH: 26.2 pg (ref 26.0–34.0)
MCHC: 35.2 g/dL (ref 30.0–36.0)
MCV: 74.4 fL — ABNORMAL LOW (ref 80.0–100.0)
Platelets: 257 10*3/uL (ref 150–400)
RBC: 3.09 MIL/uL — ABNORMAL LOW (ref 3.87–5.11)
RDW: 16.9 % — ABNORMAL HIGH (ref 11.5–15.5)
WBC: 22.2 10*3/uL — ABNORMAL HIGH (ref 4.0–10.5)
nRBC: 0.5 % — ABNORMAL HIGH (ref 0.0–0.2)

## 2021-05-09 LAB — URINE CULTURE: Culture: NO GROWTH

## 2021-05-09 LAB — MAGNESIUM: Magnesium: 2.7 mg/dL — ABNORMAL HIGH (ref 1.7–2.4)

## 2021-05-09 LAB — BASIC METABOLIC PANEL
Anion gap: 12 (ref 5–15)
BUN: 77 mg/dL — ABNORMAL HIGH (ref 8–23)
CO2: 21 mmol/L — ABNORMAL LOW (ref 22–32)
Calcium: 7.2 mg/dL — ABNORMAL LOW (ref 8.9–10.3)
Chloride: 98 mmol/L (ref 98–111)
Creatinine, Ser: 3.88 mg/dL — ABNORMAL HIGH (ref 0.44–1.00)
GFR, Estimated: 12 mL/min — ABNORMAL LOW (ref >60)
Glucose, Bld: 119 mg/dL — ABNORMAL HIGH (ref 70–99)
Potassium: 4.3 mmol/L (ref 3.5–5.1)
Sodium: 131 mmol/L — ABNORMAL LOW (ref 135–145)

## 2021-05-09 MED ORDER — ASCORBIC ACID 500 MG PO TABS
500.0000 mg | ORAL_TABLET | Freq: Two times a day (BID) | ORAL | Status: DC
  Administered 2021-05-09 – 2021-05-14 (×7): 500 mg via ORAL
  Filled 2021-05-09 (×8): qty 1

## 2021-05-09 MED ORDER — SODIUM CHLORIDE 0.9% FLUSH: 10.0000 mL | INTRAVENOUS | Status: DC | PRN

## 2021-05-09 MED ORDER — ZINC SULFATE 220 (50 ZN) MG PO CAPS
220.0000 mg | ORAL_CAPSULE | Freq: Every day | ORAL | Status: DC
  Administered 2021-05-10: 220 mg via ORAL
  Filled 2021-05-09: qty 1

## 2021-05-09 MED ORDER — BLISTEX MEDICATED EX OINT
TOPICAL_OINTMENT | CUTANEOUS | Status: DC | PRN
  Administered 2021-05-14: 1 via TOPICAL
  Filled 2021-05-09 (×4): qty 6.3

## 2021-05-09 MED ORDER — SODIUM CHLORIDE 0.9% FLUSH
10.0000 mL | Freq: Two times a day (BID) | INTRAVENOUS | Status: DC
  Administered 2021-05-09 – 2021-05-10 (×2): 10 mL
  Administered 2021-05-10: 30 mL
  Administered 2021-05-11: 10 mL
  Administered 2021-05-11: 30 mL
  Administered 2021-05-12 – 2021-05-13 (×3): 10 mL
  Administered 2021-05-13: 09:00:00 30 mL
  Administered 2021-05-14 – 2021-05-15 (×3): 10 mL
  Administered 2021-05-15: 10:00:00 30 mL
  Administered 2021-05-16: 10 mL
  Administered 2021-05-16: 10:00:00 30 mL
  Administered 2021-05-17 – 2021-05-27 (×19): 10 mL
  Administered 2021-05-27: 11:00:00 30 mL
  Administered 2021-05-28: 21:00:00 10 mL
  Administered 2021-05-28: 09:00:00 30 mL
  Administered 2021-05-29 – 2021-05-31 (×6): 10 mL
  Administered 2021-06-01: 30 mL
  Administered 2021-06-01 – 2021-06-04 (×4): 10 mL
  Administered 2021-06-04: 20 mL
  Administered 2021-06-05 – 2021-06-10 (×8): 10 mL
  Administered 2021-06-10: 20 mL
  Administered 2021-06-11: 40 mL
  Administered 2021-06-11 – 2021-06-12 (×3): 10 mL
  Administered 2021-06-13: 30 mL

## 2021-05-09 MED ORDER — HEPARIN SODIUM (PORCINE) 5000 UNIT/ML IJ SOLN
5000.0000 [IU] | Freq: Three times a day (TID) | INTRAMUSCULAR | Status: DC
  Administered 2021-05-09 – 2021-05-14 (×13): 5000 [IU] via SUBCUTANEOUS
  Filled 2021-05-09 (×13): qty 1

## 2021-05-09 NOTE — Progress Notes (Signed)
Date of Admission:  05/04/2021    ID: Alexandra Foster is a 71 y.o. female  Principal Problem:   Severe sepsis with lactic acidosis (HCC)    Subjective: Pt somnolent Eyes closed but on calling her name opens her eyes Does not want to talk but acknowledges family    Medications:   amiodarone  150 mg Intravenous Once   vitamin C  500 mg Oral BID   aspirin EC  81 mg Oral Daily   atorvastatin  40 mg Oral Daily   Chlorhexidine Gluconate Cloth  6 each Topical Q0600   docusate  100 mg Per Tube BID   heparin injection (subcutaneous)  5,000 Units Subcutaneous Q8H   insulin aspart  0-5 Units Subcutaneous QHS   insulin aspart  0-9 Units Subcutaneous TID WC   midodrine  10 mg Oral TID WC   mometasone-formoterol  2 puff Inhalation BID   montelukast  10 mg Oral QHS   multivitamin with minerals  1 tablet Oral Daily   pantoprazole  40 mg Oral Daily   polyethylene glycol  17 g Per Tube Daily   Ensure Max Protein  11 oz Oral BID   sodium hypochlorite  1 application Irrigation Daily   umeclidinium bromide  1 puff Inhalation Daily   [START ON 05/10/2021] zinc sulfate  220 mg Oral Daily    Objective: Vital signs in last 24 hours:Patient Vitals for the past 24 hrs:  BP Temp Temp src Pulse Resp SpO2  05/09/21 1830 (!) 103/50 -- -- 92 20 100 %  05/09/21 1815 (!) 97/48 -- -- 90 16 98 %  05/09/21 1800 (!) 95/48 -- -- 88 18 98 %  05/09/21 1745 (!) 97/48 -- -- 89 16 98 %  05/09/21 1730 99/60 -- -- 89 15 99 %  05/09/21 1715 (!) 95/53 -- -- 88 16 97 %  05/09/21 1700 (!) 95/56 -- -- 88 18 100 %  05/09/21 1645 (!) 87/53 -- -- 87 15 97 %  05/09/21 1630 (!) 90/52 -- -- 87 14 97 %  05/09/21 1615 (!) 88/52 -- -- 88 16 97 %  05/09/21 1600 (!) 95/54 98.1 F (36.7 C) Oral 88 15 97 %  05/09/21 1545 (!) 100/55 -- -- 87 14 99 %  05/09/21 1530 (!) 92/54 -- -- 85 16 98 %  05/09/21 1515 (!) 91/54 -- -- 86 15 98 %  05/09/21 1500 (!) 106/52 -- -- 87 16 98 %  05/09/21 1445 (!) 101/50 -- -- 87 14 98 %   05/09/21 1430 (!) 76/55 -- -- 86 16 98 %  05/09/21 1415 (!) 109/56 -- -- 85 15 96 %  05/09/21 1400 (!) 101/56 -- -- 88 15 98 %  05/09/21 1345 (!) 92/58 -- -- 86 16 98 %  05/09/21 1330 (!) 99/52 -- -- 88 17 98 %  05/09/21 1315 (!) 100/55 -- -- 88 18 97 %  05/09/21 1300 (!) 121/59 -- -- 88 16 97 %  05/09/21 1245 (!) 103/54 -- -- 87 17 99 %  05/09/21 1230 (!) 98/55 -- -- 88 17 99 %  05/09/21 1215 (!) 89/49 -- -- 84 17 99 %  05/09/21 1200 (!) 115/50 98.1 F (36.7 C) Oral 90 18 99 %  05/09/21 1145 (!) 97/57 -- -- 88 18 99 %  05/09/21 1130 (!) 105/50 -- -- 89 16 98 %  05/09/21 1115 (!) 100/52 -- -- 88 19 97 %  05/09/21 1100 (!) 101/50 -- -- 87  17 98 %  05/09/21 1045 (!) 110/50 -- -- 82 18 99 %  05/09/21 1030 106/60 -- -- 91 17 99 %  05/09/21 1015 (!) 105/55 -- -- 91 19 99 %  05/09/21 1000 (!) 102/58 -- -- 91 17 99 %  05/09/21 0945 (!) 115/57 -- -- 94 20 100 %  05/09/21 0930 103/60 -- -- 93 17 100 %  05/09/21 0915 106/60 -- -- 93 15 100 %  05/09/21 0900 (!) 106/57 -- -- 92 15 100 %  05/09/21 0845 -- -- -- 88 14 100 %  05/09/21 0830 (!) 116/59 -- -- 94 18 100 %  05/09/21 0815 -- -- -- 92 18 100 %  05/09/21 0800 (!) 112/56 97.6 F (36.4 C) Axillary 91 17 100 %  05/09/21 0700 (!) 98/56 -- -- 88 16 100 %  05/09/21 0400 105/75 98.9 F (37.2 C) Axillary 96 19 99 %  05/09/21 0000 (!) 112/59 99 F (37.2 C) Oral 93 (!) 21 100 %  05/08/21 2000 (!) 112/57 99 F (37.2 C) Oral 94 18 96 %   PHYSICAL EXAM:  General: somnolent Neck:RT IJ central line  Lungs: b/l air entry Heart: s1s2 , HR better controlled  Abdomen: Soft, non-tender,not distended. Bowel sounds normal. No masses Foley  Extremities:left leg  Post debridement 05/08/21     05/07/21   On admission   Skin: No rashes or lesions. Or bruising Lymph: Cervical, supraclavicular normal. Neurologic: cannot be assessed  Lab Results Recent Labs    05/08/21 0450 05/09/21 0545  WBC 23.7* 22.2*  HGB 9.5* 8.1*  HCT 27.7*  23.0*  NA 134* 131*  K 4.1 4.3  CL 100 98  CO2 21* 21*  BUN 71* 77*  CREATININE 3.48* 3.88*   Microbiology: 05/04/21 BC Group A strep 4/4 1of 4 staph epidermidis- a contaminant Studies/Results: DG Chest 1 View  Result Date: 05/07/2021 CLINICAL DATA:  Endotracheally intubated. EXAM: CHEST  1 VIEW COMPARISON:  Chest radiograph earlier today. FINDINGS: Endotracheal tube tip is 4 cm from the carina at the level of the clavicular heads. Right internal jugular central line tip in the region of the atrial caval junction. Cardiomegaly again seen. No pneumothorax. Lung volumes remain low without confluent consolidation. No significant pleural effusion. IMPRESSION: 1. Endotracheal tube tip 4 cm from the carina at the level of the clavicular heads. 2. Right central line tip at the atrial caval junction. 3. Stable cardiomegaly. Low lung volumes. Electronically Signed   By: Keith Rake M.D.   On: 05/07/2021 23:56   DG Chest Port 1 View  Result Date: 05/07/2021 CLINICAL DATA:  Central line placement. EXAM: PORTABLE CHEST 1 VIEW COMPARISON:  05/04/2021 FINDINGS: A new right jugular central venous catheter seen with tip overlying the mid right atrium. No evidence of pneumothorax. Moderate cardiomegaly is again noted. Patient is partially rotated to the right. Low lung volumes are again seen, however there is no evidence of pulmonary consolidation or pleural effusion. IMPRESSION: New right jugular central venous catheter tip overlies the mid right atrium. No evidence of pneumothorax. Moderate cardiomegaly and low lung volumes. Electronically Signed   By: Marlaine Hind M.D.   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   Leucocytosis  Pressor need is decreasing  AKI due to sepsis- cr upward trend Discussed with intensivist -- Discussed  IVIG risk VS benefit-  currently risk outweighs benefit with risk for thrombosis, hypervisocosity and worsening renal function  Anemia  DM on sliding scale Afib now in sinus rhythm- on Amiodarone  Transaminitis/hyperbilirubinemia  Discussed the management with family and her nurse

## 2021-05-09 NOTE — Progress Notes (Signed)
NAME:  Alexandra Foster, MRN:  [id number], DOB:  [birth year], LOS: 5 ADMISSION DATE:  05/04/2021   BRIEF SYNOPSIS 71 y.o female with medical history significant of Asthma/COPD, T2DM, HTN, OSA on CPAP, GERD, COVID-19 pneumonia, and Bilateral lower extremity edema who presented to the ED with chief complaints of LLE pain and chills since Thursday.   Patient given 30 cc/kg of fluids and started on broad-spectrum antibiotics for sepsis with septic shock. Patient remained hypotensive despite IVF boluses therefore was started on Levophed. PCCM consulted.  NOW WITH DX OF SEVERE SEPTIC SHOCK FROM NECROTIZING FASCIITIS  Past Medical History     Arthritis   Asthma   COPD (chronic obstructive pulmonary disease) ([place])   Diabetes mellitus without complication ([place])   Endometriosis   GERD (gastroesophageal reflux disease)   Hypertension   Obesity   Sleep apnea    CPAP    Significant Hospital Events   05/04/21: Admitted to the ICU with severe sepsis with shock secondary to cellulitis of LLE 1/23 off pressors +Afib with RVR 1/24 blood pressure low  1/24 to OR for fasciotomy 1/25 post op resp failure with severe septic shock 1/26 remains on pressors   Significant Diagnostic Tests:  1/21: Chest Xray> no active cardiopulmonary process 1/21: CT left lower leg>Diffuse subcutaneous edema may represent cellulitis. No drainable fluid collection/abscess 1/21: Ultrasound lower unilateral left> no DVT   Micro Data:  1/21: SARS-CoV-2 PCR> negative 1/21: Influenza PCR> negative 1/21: Blood culture x2> 1/21: Urine Culture> 1/21: MRSA PCR>>    Antimicrobials:  Vancomycin 1/21> Cefepime 1/21> Ceftriaxone 1/21 X1 Micro Data:  1/15 COVID NEG 1/21 strep pyogenes bacteremia      Interim History / Subjective:  Patient with acute ned fasciitis with emergent fasciotomy Critically ill remains on pressors Prognosis is poor      Objective   Blood pressure (!) 110/50, pulse 82, temperature  97.6 F (36.4 C), temperature source Axillary, resp. rate 18, height 5\' 5"  (1.651 m), weight 123.4 kg, SpO2 99 %.        Intake/Output Summary (Last 24 hours) at 05/09/2021 1125 Last data filed at 05/09/2021 1051 Gross per 24 hour  Intake 3769.43 ml  Output 1200 ml  Net 2569.43 ml    Filed Weights   05/04/21 1941 05/05/21 1100  Weight: 120.7 kg 123.4 kg   LIMITED ROS due to lethargy    PHYSICAL EXAMINATION:  GENERAL:critically ill appearing,  NECK: Supple.  PULMONARY: +rhonchi,  CARDIOVASCULAR: S1 and S2.  No murmurs  GASTROINTESTINAL: Soft, nontender, -distended. Positive bowel sounds.  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 with post op resp failure with severe septic shock   SEPTIC shock SOURCE-LLE -use vasopressors to keep MAP>65 as needed - stress dose steroids   CARDIAC new onset afib wth RVR ICU monitoring  amio infusion and heparin infusion  ACUTE KIDNEY INJURY/Renal Failure -continue Foley Catheter-assess need -Avoid nephrotoxic agents -Follow urine output, BMP -Ensure adequate renal perfusion, optimize oxygenation -Renal dose medications   Intake/Output Summary (Last 24 hours) at 05/09/2021 1127 Last data filed at 05/09/2021 1051 Gross per 24  hour  Intake 3769.43 ml  Output 1200 ml  Net 2569.43 ml     BMP Latest Ref Rng & Units 05/09/2021 05/08/2021 05/07/2021  Glucose 70 - 99 mg/dL 119(H) 168(H) 148(H)  BUN 8 - 23 mg/dL 77(H) 71(H) 69(H)  Creatinine 0.44 - 1.00 mg/dL 3.88(H) 3.48(H) 3.32(H)  BUN/Creat Ratio 6 -  22 (calc) - - -  Sodium 135 - 145 mmol/L 131(L) 134(L) 134(L)  Potassium 3.5 - 5.1 mmol/L 4.3 4.1 3.4(L)  Chloride 98 - 111 mmol/L 98 100 98  CO2 22 - 32 mmol/L 21(L) 21(L) 22  Calcium 8.9 - 10.3 mg/dL 7.2(L) 7.5(L) 7.0(L)    INFECTIOUS DISEASE -continue antibiotics as prescribed -follow up cultures -follow up ID consultation   ENDO - ICU hypoglycemic\Hyperglycemia protocol -check FSBS per protocol   GI GI PROPHYLAXIS as indicated  NUTRITIONAL STATUS DIET-->as tolerated Constipation protocol as indicated   ELECTROLYTES -follow labs as needed -replace as needed -pharmacy consultation and following  ACUTE ANEMIA- TRANSFUSE AS NEEDED CONSIDER TRANSFUSION  IF HGB<7 DVT PRX with TED/SCD's ONLY     Best practice:  Diet:  Oral Pain/Anxiety/Delirium protocol (if indicated): No VAP protocol (if indicated): Not indicated DVT prophylaxis: Subcutaneous Heparin GI prophylaxis: PPI Glucose control:  SSI Yes Central venous access:  N/A Arterial line:  N/A Foley:  N/A Mobility:  bed rest  PT consulted: N/A Last date of multidisciplinary goals of care discussion [1/21] Code Status:  full code Disposition: ICU   Labs   CBC: Recent Labs  Lab 05/04/21 1943 05/05/21 0454 05/06/21 0325 05/07/21 0821 05/08/21 0450 05/09/21 0545  WBC 16.5* 17.6* 16.2* 21.9* 23.7* 22.2*  NEUTROABS 15.5*  --   --   --   --   --   HGB 12.5 10.8* 10.5* 9.7* 9.5* 8.1*  HCT 40.8 35.7* 33.4* 29.9* 27.7* 23.0*  MCV 78.5* 77.9* 75.9* 73.3* 75.9* 74.4*  PLT 282 228 240 294 270 257     Basic Metabolic Panel: Recent Labs  Lab 05/05/21 1153 05/05/21 1737 05/06/21 0325 05/07/21 0821 05/08/21 0450 05/09/21 0545  NA  --  139 138 134* 134* 131*  K  --  3.8 4.2 3.4* 4.1 4.3  CL  --  105 104 98 100 98  CO2  --  18* 23 22 21* 21*  GLUCOSE  --  166* 189* 148* 168* 119*  BUN  --  54* 60* 69* 71* 77*  CREATININE  --  3.43* 3.46* 3.32* 3.48* 3.88*  CALCIUM  --  7.7* 7.4* 7.0* 7.5* 7.2*  MG  2.2  --  2.4 2.5* 2.5* 2.7*  PHOS 4.5  --  4.4 4.5 7.7* 7.8*    GFR: Estimated Creatinine Clearance: 17.3 mL/min (A) (by C-G formula based on SCr of 3.88 mg/dL (H)). Recent Labs  Lab 05/04/21 2130 05/04/21 2150 05/05/21 0454 05/05/21 1153 05/05/21 1737 05/05/21 2017 05/06/21 0325 05/07/21 0821 05/07/21 1046 05/08/21 0450 05/09/21 0545  PROCALCITON 35.76  --  40.92  --   --   --  66.16  --   --   --   --   WBC  --   --  17.6*  --   --   --  16.2* 21.9*  --  23.7* 22.2*  LATICACIDVEN  --    < >  --    < > 4.1* 2.9*  --  1.7 1.7  --   --    < > = values in this interval not displayed.     Liver  Function Tests: Recent Labs  Lab 05/04/21 1943 05/08/21 1313  AST 67* 136*  ALT 48* 47*  ALKPHOS 103 135*  BILITOT 1.1 3.8*  PROT 7.9 4.8*  ALBUMIN 2.8* <1.5*    Coagulation Profile: Recent Labs  Lab 05/05/21 0027  INR 1.3*     Cardiac Enzymes: Recent Labs  Lab 05/08/21 1313  CKTOTAL 1,575*    HbA1C: Hgb A1c MFr Bld  Date/Time Value Ref Range Status  05/05/2021 04:53 AM 6.7 (H) 4.8 - 5.6 % Final    Comment:    (NOTE)         Prediabetes: 5.7 - 6.4         Diabetes: >6.4         Glycemic control for adults with diabetes: <7.0   02/11/2021 09:41 AM 6.3 (H) <5.7 % of total Hgb Final    Comment:    For someone without known diabetes, a hemoglobin  A1c value between 5.7% and 6.4% is consistent with prediabetes and should be confirmed with a  follow-up test. . For someone with known diabetes, a value <7% indicates that their diabetes is well controlled. A1c targets should be individualized based on duration of diabetes, age, comorbid conditions, and other considerations. . This assay result is consistent with an increased risk of diabetes. . Currently, no consensus exists regarding use of hemoglobin A1c for diagnosis of diabetes for children. .     CBG: Recent Labs  Lab 05/08/21 1115 05/08/21 1609 05/08/21 2142 05/09/21 0729 05/09/21 1103   GLUCAP 125* 120* 104* 119* 124*    No Known Allergies   Home Medications  Prior to Admission medications   Medication Sig Start Date End Date Taking? Authorizing Provider  albuterol (PROVENTIL) (2.5 MG/3ML) 0.083% nebulizer solution Take 3 mLs (2.5 mg total) by nebulization every 6 (six) hours as needed for wheezing or shortness of breath. 10/03/19  Yes Sowles, Drue Stager, MD  aspirin 81 MG tablet Take 1 tablet (81 mg total) by mouth daily. 01/08/16  Yes Sowles, Drue Stager, MD  atorvastatin (LIPITOR) 40 MG tablet Take 1 tablet (40 mg total) by mouth daily. 02/11/21  Yes Sowles, Drue Stager, MD  azelastine (ASTELIN) 0.1 % nasal spray Place 2 sprays into both nostrils 2 (two) times daily as needed for rhinitis or allergies. 07/31/20  Yes Delsa Grana, PA-C  baclofen (LIORESAL) 10 MG tablet Take 10 mg by mouth daily. 04/16/21  Yes [provider]  celecoxib (CELEBREX) 200 MG capsule Take 200 mg by mouth daily. 04/03/21  Yes [provider]  Cetirizine HCl (ZYRTEC ALLERGY) 10 MG TBDP Take 10 mg by mouth at bedtime. For sinus symptoms and seasonal allergies 07/31/20  Yes Delsa Grana, PA-C  docusate sodium (COLACE) 100 MG capsule Take 100 mg by mouth daily.   Yes [provider]  fluticasone Asencion Islam) 50 MCG/ACT nasal spray  08/31/20  Yes [provider]  glucose blood (ACCU-CHEK AVIVA PLUS) test strip Use as instructed 12/08/17  Yes Sowles, Drue Stager, MD  Lancets (ACCU-CHEK SOFT TOUCH) lancets Use as instructed 12/08/17  Yes Sowles, Drue Stager, MD  meloxicam (MOBIC) 15 MG tablet Take 15 mg by mouth daily. 04/23/21  Yes [provider]  metFORMIN (GLUCOPHAGE-XR) 750 MG 24 hr tablet Take 1 tablet (750 mg total) by mouth daily with breakfast. 02/11/21  Yes Sowles, Drue Stager, MD  montelukast (SINGULAIR) 10 MG tablet Take 1 tablet (10 mg total) by mouth at bedtime. 02/11/21  Yes Sowles, Drue Stager, MD  Multiple Vitamins-Minerals (MULTIVITAL PO) Take 1 tablet by  mouth daily.   Yes  [provider]  omeprazole (PRILOSEC) 20 MG capsule Take 1 capsule (20 mg total) by mouth daily. 02/11/21  Yes Steele Sizer, MD  [place] 30 MG/2ML SOSY  10/09/20  Yes [provider]  triamterene-hydrochlorothiazide (DYAZIDE) 37.5-25 MG capsule TAKE 1 CAPSULE EVERY DAY 05/02/21  Yes Sowles, Drue Stager, MD  vitamin B-12 (CYANOCOBALAMIN) 100 MCG tablet Take 100 mcg by mouth daily.   Yes [provider]  calcium carbonate (OSCAL) 1500 (600 Ca) MG TABS tablet Take 600 mg of elemental calcium by mouth 2 (two) times daily with a meal. Patient not taking: Reported on 05/05/2021    [provider]  Fluticasone-Umeclidin-Vilant (TRELEGY ELLIPTA) 100-62.5-25 MCG/INH AEPB Inhale 1 puff into the lungs daily. In place of Stiolto 02/10/20   Steele Sizer, MD  loratadine (CLARITIN) 10 MG tablet Take 1 tablet (10 mg total) by mouth daily. 02/11/21   Steele Sizer, MD          DVT/GI PRX  assessed I Assessed the need for Labs I Assessed the need for Foley I Assessed the need for Central Venous Line Family Discussion when available I Assessed the need for Mobilization I made an Assessment of medications to be adjusted accordingly Safety Risk assessment completed  CASE DISCUSSED IN MULTIDISCIPLINARY ROUNDS WITH ICU TEAM     Critical Care Time devoted to patient care services described in this note is 55 minutes.  Critical care was necessary to treat /prevent imminent and life-threatening deterioration. Overall, patient is critically ill, prognosis is guarded.  Patient with Multiorgan failure and at high risk for cardiac arrest and death.    Corrin Parker, M.D.  [facility]  Medical Director [place] Director [facility] Cardio-Pulmonary Department

## 2021-05-09 NOTE — Progress Notes (Signed)
Initial Nutrition Assessment  DOCUMENTATION CODES:   Morbid obesity  INTERVENTION:   Recommend consideration of nutrition support to support wound healing   Ensure Max protein supplement po BID, each supplement provides 150kcal and 30g of protein.  MVI po daily  Vitamin C 561m po BID  Zinc 228m(5018mlemental) po daily   Pt at high refeed risk; recommend monitor potassium, magnesium and phosphorus labs daily until stable  NUTRITION DIAGNOSIS:   Increased nutrient needs related to wound healing as evidenced by estimated needs.  GOAL:   Patient will meet greater than or equal to 90% of their needs  MONITOR:   PO intake, Supplement acceptance, Labs, Weight trends, Skin, I & O's  REASON FOR ASSESSMENT:   Rounds    ASSESSMENT:   70 39o. female with a history of asthma/COPD, HTN, DMII, obesity, OSA on CPAP, GERD and COVID-19 pneumonia who was admitted 1/22 w/ left LE cellulitis, septic shock, S pyogenes bacteremia, hypotension, new Afib and AKI.  Pt s/p I & D 1/24  Met with pt in room today. Pt lethargic today. Pt reports that she is not hungry. Pt has been refusing meals and supplements. RD discussed with pt the importance of adequate nutrition needed to support wound healing. Pt reports that she will drink the Ensure but then refused later when it was offered. Pt eating <25% of meals for her entire admission. Wound recommend consideration of nutrition support to support wound healing. Pt is at high refeed risk. Per chart, pt appears weight stable at baseline. Pt has not been weighed since 1/22; will request daily weights.   Medications reviewed and include: aspirin, colace, heparin, insulin, MVI, protonix, miralax, levophed, penicillin, hydromorphone, miralax  Labs reviewed: Na 131(L), BUN 77(H), creat 3.88(H), P 7.8(H), Mg 2.7(H), albumin <1.5(L), AST 136(H), ALT 47(H), tbili 3.8(H) Wbc- 22.2(H), Hgb 8.1(L), Hct 23.0(L) Cbgs- 124, 119 x 24hrs AIC 6.7(H)-  1/22  NUTRITION - FOCUSED PHYSICAL EXAM:  Flowsheet Row Most Recent Value  Orbital Region No depletion  Upper Arm Region No depletion  Thoracic and Lumbar Region No depletion  Buccal Region No depletion  Temple Region No depletion  Clavicle Bone Region No depletion  Clavicle and Acromion Bone Region No depletion  Scapular Bone Region No depletion  Dorsal Hand No depletion  Patellar Region No depletion  Anterior Thigh Region No depletion  Posterior Calf Region No depletion  Edema (RD Assessment) Mild  Hair Reviewed  Eyes Reviewed  Mouth Reviewed  Skin Reviewed  Nails Reviewed   Diet Order:   Diet Order             Diet NPO time specified  Diet effective now                  EDUCATION NEEDS:   Education needs have been addressed  Skin:  Skin Assessment: Reviewed RN Assessment (necrotizing fasciitis L leg 30 x 30 cm)  Last BM:  1/26- type 5  Height:   Ht Readings from Last 1 Encounters:  05/04/21 '5\' 5"'  (1.651 m)    Weight:   Wt Readings from Last 1 Encounters:  05/05/21 123.4 kg    Ideal Body Weight:  56.8 kg  BMI:  Body mass index is 45.27 kg/m.  Estimated Nutritional Needs:   Kcal:  2400-2700kcal/day  Protein:  120-140g/day  Fluid:  1.7-2.0L/day  CasKoleen Distance, RD, LDN Please refer to [facility] and/or RD on-call/weekend/after hours pager

## 2021-05-09 NOTE — Progress Notes (Signed)
Progress Note  Patient Name: Alexandra Foster Date of Encounter: 05/09/2021  [facility] Cardiologist: Kathlyn Sacramento, MD   Subjective   Patient remains in NSR. Plan for possible repeat debridement Friday. Scr 3.88, BUN 77,  Hgb 8.1, WBC 22.2.   Inpatient Medications    Scheduled Meds:  amiodarone  150 mg Intravenous Once   aspirin EC  81 mg Oral Daily   atorvastatin  40 mg Oral Daily   Chlorhexidine Gluconate Cloth  6 each Topical Q0600   docusate  100 mg Per Tube BID   insulin aspart  0-5 Units Subcutaneous QHS   insulin aspart  0-9 Units Subcutaneous TID WC   midodrine  10 mg Oral TID WC   mometasone-formoterol  2 puff Inhalation BID   montelukast  10 mg Oral QHS   multivitamin with minerals  1 tablet Oral Daily   pantoprazole  40 mg Oral Daily   polyethylene glycol  17 g Per Tube Daily   Ensure Max Protein  11 oz Oral BID   sodium hypochlorite  1 application Irrigation Daily   umeclidinium bromide  1 puff Inhalation Daily   Continuous Infusions:  sodium chloride Stopped (05/05/21 0816)   amiodarone 30 mg/hr (05/09/21 0850)   linezolid (ZYVOX) IV 600 mg (05/08/21 2138)   norepinephrine (LEVOPHED) Adult infusion 8 mcg/min (05/09/21 0850)   pencillin G potassium IV 3 Million Units (05/09/21 0434)   PRN Meds: docusate sodium, HYDROmorphone (DILAUDID) injection, oxyCODONE, polyethylene glycol   Vital Signs    Vitals:   05/09/21 0815 05/09/21 0830 05/09/21 0845 05/09/21 0900  BP:  (!) 116/59  (!) 106/57  Pulse: 92 94 88 92  Resp: 18 18 14 15   Temp:      TempSrc:      SpO2: 100% 100% 100% 100%  Weight:      Height:        Intake/Output Summary (Last 24 hours) at 05/09/2021 0921 Last data filed at 05/09/2021 0850 Gross per 24 hour  Intake 2584.86 ml  Output 1500 ml  Net 1084.86 ml   Last 3 Weights 05/05/2021 05/04/2021 04/30/2021  Weight (lbs) 272 lb 0.8 oz 266 lb 269 lb  Weight (kg) 123.4 kg 120.657 kg 122.018 kg      Telemetry    NSR, HR 90s -  Personally Reviewed  ECG    No new - Personally Reviewed  Physical Exam   GEN: No acute distress.   Neck: No JVD Cardiac: RRR, no murmurs, rubs, or gallops.  Respiratory: Clear to auscultation bilaterally. GI: Soft, nontender, non-distended  MS: No edema; LLE wrapped Neuro:  Nonfocal  Psych: Normal affect   Labs    High Sensitivity Troponin:  No results for input(s): TROPONINIHS in the last 720 hours.   Chemistry Recent Labs  Lab 05/04/21 1943 05/05/21 0454 05/07/21 0821 05/08/21 0450 05/08/21 1313 05/09/21 0545  NA 141   < > 134* 134*  --  131*  K 2.9*   < > 3.4* 4.1  --  4.3  CL 99   < > 98 100  --  98  CO2 26   < > 22 21*  --  21*  GLUCOSE 144*   < > 148* 168*  --  119*  BUN 44*   < > 69* 71*  --  77*  CREATININE 3.47*   < > 3.32* 3.48*  --  3.88*  CALCIUM 8.7*   < > 7.0* 7.5*  --  7.2*  MG  --    < >  2.5* 2.5*  --  2.7*  PROT 7.9  --   --   --  4.8*  --   ALBUMIN 2.8*  --   --   --  <1.5*  --   AST 67*  --   --   --  136*  --   ALT 48*  --   --   --  47*  --   ALKPHOS 103  --   --   --  135*  --   BILITOT 1.1  --   --   --  3.8*  --   GFRNONAA 13*   < > 14* 13*  --  12*  ANIONGAP 16*   < > 14 13  --  12   < > = values in this interval not displayed.    Lipids  Recent Labs  Lab 05/08/21 0450  TRIG 183*    Hematology Recent Labs  Lab 05/07/21 0821 05/08/21 0450 05/09/21 0545  WBC 21.9* 23.7* 22.2*  RBC 4.08 3.65* 3.09*  HGB 9.7* 9.5* 8.1*  HCT 29.9* 27.7* 23.0*  MCV 73.3* 75.9* 74.4*  MCH 23.8* 26.0 26.2  MCHC 32.4 34.3 35.2  RDW 14.8 16.1* 16.9*  PLT 294 270 257   Thyroid No results for input(s): TSH, FREET4 in the last 168 hours.  BNPNo results for input(s): BNP, PROBNP in the last 168 hours.  DDimer  Recent Labs  Lab 05/05/21 0027  DDIMER 2.22*     Radiology    DG Chest 1 View  Result Date: 05/07/2021 CLINICAL DATA:  Endotracheally intubated. EXAM: CHEST  1 VIEW COMPARISON:  Chest radiograph earlier today. FINDINGS: Endotracheal  tube tip is 4 cm from the carina at the level of the clavicular heads. Right internal jugular central line tip in the region of the atrial caval junction. Cardiomegaly again seen. No pneumothorax. Lung volumes remain low without confluent consolidation. No significant pleural effusion. IMPRESSION: 1. Endotracheal tube tip 4 cm from the carina at the level of the clavicular heads. 2. Right central line tip at the atrial caval junction. 3. Stable cardiomegaly. Low lung volumes. Electronically Signed   By: Keith Rake M.D.   On: 05/07/2021 23:56   DG Chest Port 1 View  Result Date: 05/07/2021 CLINICAL DATA:  Central line placement. EXAM: PORTABLE CHEST 1 VIEW COMPARISON:  05/04/2021 FINDINGS: A new right jugular central venous catheter seen with tip overlying the mid right atrium. No evidence of pneumothorax. Moderate cardiomegaly is again noted. Patient is partially rotated to the right. Low lung volumes are again seen, however there is no evidence of pulmonary consolidation or pleural effusion. IMPRESSION: New right jugular central venous catheter tip overlies the mid right atrium. No evidence of pneumothorax. Moderate cardiomegaly and low lung volumes. Electronically Signed   By: Marlaine Hind M.D.   On: 05/07/2021 20:18    Cardiac Studies   2D Echocardiogram 01.23.2023    1. Left ventricular ejection fraction, by estimation, is 50 to 55%. The  left ventricle has low normal function. Left ventricular endocardial  border not optimally defined to evaluate regional wall motion. There is  mild left ventricular hypertrophy. Left  ventricular diastolic parameters are indeterminate.   2. Right ventricular systolic function is normal. The right ventricular  size is normal. Tricuspid regurgitation signal is inadequate for assessing  PA pressure.   3. Left atrial size was mildly dilated.   4. Right atrial size was mildly dilated.   5. The mitral valve is normal in  structure. No evidence of mitral valve   regurgitation. No evidence of mitral stenosis.   6. The aortic valve is normal in structure. Aortic valve regurgitation is  not visualized. Aortic valve sclerosis/calcification is present, without  any evidence of aortic stenosis.   7. challenging image quality.   Patient Profile     71 y.o. female with a history of asthma/COPD, HTN, DMII, obesity, OSA on CPAP, GERD, and COVID-19 pneumonia, who was admitted 1/22 w/ L LE cellulitis, septic shock, S pyogenes bacteremia, hypotension, and AKI.  Developed rapid Afib 1/23  Converted to sinus vs ectopic atrial rhythm early on 1/24 on amio. S/p debridement of LLE.  Assessment & Plan    Afib RVR Ectopic atrial rhythm - in the setting of lower leg cellulitis, septic shock, hypotension, AKI and S pyogenes bacteremia - noted on 1/23 and converted to NSR 1/24 - IV amiodarone - BP unable to tolerated BB or CCB, on Norepi and midodrine - IV heparin held for anemia - CHADSVAC of 4, she will need longterm anticoagulation once able - continue IV amio for now, maybe can transition to oral after procedures  Septic shock Hypotension LLD cellulitis - s/p debridement - extubated 1/25 - abx and vasopressors, weaning levo - surgery and CCM following  AKI/ATN - scr  up today - may need IVIG  Anemia - s/p PRBCs - IV heparin held - monitor HGb  For questions or updates, please contact [facility] Please consult [email or web address] for contact info under        Signed, Zahara Rembert Ninfa Meeker, PA-C  05/09/2021, 9:21 AM

## 2021-05-09 NOTE — Consult Note (Signed)
PHARMACY CONSULT NOTE - FOLLOW UP  Pharmacy Consult for Electrolyte Monitoring and Replacement   Recent Labs: Potassium (mmol/L)  Date Value  05/09/2021 4.3  08/11/2013 3.3 (L)   Magnesium (mg/dL)  Date Value  05/09/2021 2.7 (H)   Calcium (mg/dL)  Date Value  05/09/2021 7.2 (L)   Calcium, Total (mg/dL)  Date Value  08/11/2013 9.2   Albumin (g/dL)  Date Value  05/08/2021 <1.5 (L)  08/11/2013 3.1 (L)   Phosphorus (mg/dL)  Date Value  05/09/2021 7.8 (H)   Sodium (mmol/L)  Date Value  05/09/2021 131 (L)  08/11/2013 138     Assessment: Pharmacy has been consulted to monitor and replace electrolytes in 71 yo patient with severe sepsis and strep pyogenes bacteremia.    Goal of Therapy:  Electrolytes WNL  Plan:  --No replacement indicated at this time --Continue to follow along  Tawnya Crook, PharmD, BCPS Clinical Pharmacist 05/09/2021 1:30 PM

## 2021-05-09 NOTE — Progress Notes (Signed)
Patient ID: Alexandra Foster, female   DOB: [birth year], 71 y.o.   MRN: [id number]     [facility] Day(s): 5.   Interval History: Patient seen and examined, no acute events or new complaints overnight.  Slowly decreasing Levophed unable to be completely continued.  Continue with elevated white blood cell count.  Vital signs in last 24 hours: [min-max] current  Temp:  [97.6 F (36.4 C)-99 F (37.2 C)] 98.1 F (36.7 C) (01/26 1600) Pulse Rate:  [82-96] 89 (01/26 1745) Resp:  [14-21] 16 (01/26 1745) BP: (76-121)/(48-75) 97/48 (01/26 1745) SpO2:  [96 %-100 %] 98 % (01/26 1745)     Height: 5\' 5"  (165.1 cm) Weight: 123.4 kg BMI (Calculated): 45.27   Physical Exam:  Constitutional: Decreased mental status but arousable and able to answer simple questions. Extremity: Left leg open wound with healthy muscular tissue.  Questionable posterior skin ischemia     Labs:  CBC Latest Ref Rng & Units 05/09/2021 05/08/2021 05/07/2021  WBC 4.0 - 10.5 K/uL 22.2(H) 23.7(H) 21.9(H)  Hemoglobin 12.0 - 15.0 g/dL 8.1(L) 9.5(L) 9.7(L)  Hematocrit 36.0 - 46.0 % 23.0(L) 27.7(L) 29.9(L)  Platelets 150 - 400 K/uL 257 270 294   CMP Latest Ref Rng & Units 05/09/2021 05/08/2021 05/07/2021  Glucose 70 - 99 mg/dL 119(H) 168(H) 148(H)  BUN 8 - 23 mg/dL 77(H) 71(H) 69(H)  Creatinine 0.44 - 1.00 mg/dL 3.88(H) 3.48(H) 3.32(H)  Sodium 135 - 145 mmol/L 131(L) 134(L) 134(L)  Potassium 3.5 - 5.1 mmol/L 4.3 4.1 3.4(L)  Chloride 98 - 111 mmol/L 98 100 98  CO2 22 - 32 mmol/L 21(L) 21(L) 22  Calcium 8.9 - 10.3 mg/dL 7.2(L) 7.5(L) 7.0(L)  Total Protein 6.5 - 8.1 g/dL - 4.8(L) -  Total Bilirubin 0.3 - 1.2 mg/dL - 3.8(H) -  Alkaline Phos 38 - 126 U/L - 135(H) -  AST 15 - 41 U/L - 136(H) -  ALT 0 - 44 U/L - 47(H) -    Imaging studies: No new pertinent imaging studies   Assessment/Plan:  71 y.o. female with left leg necrotizing fasciitis with 1 Day Post-Op s/p debridement, complicated by pertinent  comorbidities including strep pyogenes bacteremia, COPD, type 2 diabetes mellitus, hypertension, obstructive sleep apnea, GERD   Left leg necrotizing fasciitis -S/p debridement 05/07/2021 -I personally changed the wound and apply wet-to-dry Dakin's solution dressing. -There was a viable muscular tissue.  There was no purulent or necrotic tissue at this moment.  Posterior skin questionable survival. -I plan to do second debridement and deep cleaning of the deep muscular or bone tissue in the OR tomorrow and possible placement of negative pressure dressing.  Family at bedside and agree with plan. 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 -I will continue to follow closely.  Arnold Long, MD

## 2021-05-09 NOTE — Anesthesia Postprocedure Evaluation (Signed)
Anesthesia Post Note  Patient: Alexandra Foster  Procedure(s) Performed: DEBRIDEMENT WOUND (Left: Leg Lower)  Patient location during evaluation: SICU Anesthesia Type: General Level of consciousness: awake and awake and alert Pain management: pain level controlled Vital Signs Assessment: post-procedure vital signs reviewed and stable Respiratory status: spontaneous breathing and respiratory function stable Cardiovascular status: stable Postop Assessment: no apparent nausea or vomiting Anesthetic complications: no   No notable events documented.   Last Vitals:  Vitals:   05/09/21 0000 05/09/21 0400  BP: (!) 112/59 105/75  Pulse: 93 96  Resp: (!) 21 19  Temp: 37.2 C 37.2 C  SpO2: 100% 99%    Last Pain:  Vitals:   05/09/21 0417  TempSrc:   PainSc: 0-No pain                 Rolla Plate P

## 2021-05-10 ENCOUNTER — Inpatient Hospital Stay: Payer: Medicare HMO

## 2021-05-10 ENCOUNTER — Other Ambulatory Visit: Payer: Self-pay

## 2021-05-10 ENCOUNTER — Inpatient Hospital Stay: Payer: Medicare HMO | Admitting: Anesthesiology

## 2021-05-10 ENCOUNTER — Encounter
Admission: EM | Disposition: A | Discharge status: Nursing Home (Includes ICF) | Payer: Self-pay | Source: Home / Self Care | Attending: Internal Medicine

## 2021-05-10 DIAGNOSIS — I4891 Unspecified atrial fibrillation: Secondary | ICD-10-CM | POA: Diagnosis not present

## 2021-05-10 DIAGNOSIS — E119 Type 2 diabetes mellitus without complications: Secondary | ICD-10-CM | POA: Diagnosis not present

## 2021-05-10 DIAGNOSIS — R6520 Severe sepsis without septic shock: Secondary | ICD-10-CM | POA: Diagnosis not present

## 2021-05-10 DIAGNOSIS — I4892 Unspecified atrial flutter: Secondary | ICD-10-CM | POA: Diagnosis not present

## 2021-05-10 DIAGNOSIS — A419 Sepsis, unspecified organism: Secondary | ICD-10-CM | POA: Diagnosis not present

## 2021-05-10 DIAGNOSIS — M726 Necrotizing fasciitis: Secondary | ICD-10-CM | POA: Diagnosis not present

## 2021-05-10 DIAGNOSIS — L03116 Cellulitis of left lower limb: Secondary | ICD-10-CM | POA: Diagnosis not present

## 2021-05-10 DIAGNOSIS — E8809 Other disorders of plasma-protein metabolism, not elsewhere classified: Secondary | ICD-10-CM

## 2021-05-10 DIAGNOSIS — A400 Sepsis due to streptococcus, group A: Secondary | ICD-10-CM | POA: Diagnosis not present

## 2021-05-10 DIAGNOSIS — D649 Anemia, unspecified: Secondary | ICD-10-CM | POA: Diagnosis not present

## 2021-05-10 DIAGNOSIS — N170 Acute kidney failure with tubular necrosis: Secondary | ICD-10-CM

## 2021-05-10 DIAGNOSIS — B950 Streptococcus, group A, as the cause of diseases classified elsewhere: Secondary | ICD-10-CM

## 2021-05-10 HISTORY — PX: INCISION AND DRAINAGE OF WOUND: SHX1803

## 2021-05-10 HISTORY — PX: APPLICATION OF WOUND VAC: SHX5189

## 2021-05-10 LAB — CBC
HCT: 17.8 % — ABNORMAL LOW (ref 36.0–46.0)
HCT: 16.3 % — ABNORMAL LOW (ref 36.0–46.0)
Hemoglobin: 6.2 g/dL — ABNORMAL LOW (ref 12.0–15.0)
Hemoglobin: 4.9 g/dL — CL (ref 12.0–15.0)
MCH: 26.2 pg (ref 26.0–34.0)
MCH: 26.1 pg (ref 26.0–34.0)
MCHC: 34.8 g/dL (ref 30.0–36.0)
MCHC: 30.1 g/dL (ref 30.0–36.0)
MCV: 75.1 fL — ABNORMAL LOW (ref 80.0–100.0)
MCV: 86.7 fL (ref 80.0–100.0)
Platelets: 317 10*3/uL (ref 150–400)
Platelets: 267 10*3/uL (ref 150–400)
RBC: 2.37 MIL/uL — ABNORMAL LOW (ref 3.87–5.11)
RBC: 1.88 MIL/uL — ABNORMAL LOW (ref 3.87–5.11)
RDW: 17.9 % — ABNORMAL HIGH (ref 11.5–15.5)
RDW: 19.9 % — ABNORMAL HIGH (ref 11.5–15.5)
WBC: 23.6 10*3/uL — ABNORMAL HIGH (ref 4.0–10.5)
WBC: 40.9 10*3/uL — ABNORMAL HIGH (ref 4.0–10.5)
nRBC: 0.8 % — ABNORMAL HIGH (ref 0.0–0.2)
nRBC: 3.2 % — ABNORMAL HIGH (ref 0.0–0.2)

## 2021-05-10 LAB — BASIC METABOLIC PANEL
Anion gap: 13 (ref 5–15)
BUN: 83 mg/dL — ABNORMAL HIGH (ref 8–23)
BUN: 86 mg/dL — ABNORMAL HIGH (ref 8–23)
CO2: 19 mmol/L — ABNORMAL LOW (ref 22–32)
CO2: <7 mmol/L — ABNORMAL LOW (ref 22–32)
Calcium: 7.5 mg/dL — ABNORMAL LOW (ref 8.9–10.3)
Calcium: 7.5 mg/dL — ABNORMAL LOW (ref 8.9–10.3)
Chloride: 101 mmol/L (ref 98–111)
Chloride: 106 mmol/L (ref 98–111)
Creatinine, Ser: 3.68 mg/dL — ABNORMAL HIGH (ref 0.44–1.00)
Creatinine, Ser: 4.32 mg/dL — ABNORMAL HIGH (ref 0.44–1.00)
GFR, Estimated: 13 mL/min — ABNORMAL LOW (ref >60)
GFR, Estimated: 10 mL/min — ABNORMAL LOW (ref >60)
Glucose, Bld: 145 mg/dL — ABNORMAL HIGH (ref 70–99)
Glucose, Bld: 24 mg/dL — CL (ref 70–99)
Potassium: 4.6 mmol/L (ref 3.5–5.1)
Potassium: >7.5 mmol/L (ref 3.5–5.1)
Sodium: 133 mmol/L — ABNORMAL LOW (ref 135–145)
Sodium: 138 mmol/L (ref 135–145)

## 2021-05-10 LAB — BLOOD GAS, VENOUS
Acid-base deficit: 24.1 mmol/L — ABNORMAL HIGH (ref 0.0–2.0)
Bicarbonate: 7.3 mmol/L — ABNORMAL LOW (ref 20.0–28.0)
FIO2: 100
MECHVT: 460 mL
Mechanical Rate: 20
O2 Saturation: 74.6 %
PEEP: 5 cmH2O
Patient temperature: 37
pCO2, Ven: 38 mmHg — ABNORMAL LOW (ref 44.0–60.0)
pH, Ven: <6.9 — CL (ref 7.250–7.430)
pO2, Ven: 69 mmHg — ABNORMAL HIGH (ref 32.0–45.0)

## 2021-05-10 LAB — HEPATIC FUNCTION PANEL
ALT: 36 U/L (ref 0–44)
AST: 97 U/L — ABNORMAL HIGH (ref 15–41)
Albumin: <1.5 g/dL — ABNORMAL LOW (ref 3.5–5.0)
Alkaline Phosphatase: 145 U/L — ABNORMAL HIGH (ref 38–126)
Bilirubin, Direct: 1 mg/dL — ABNORMAL HIGH (ref 0.0–0.2)
Indirect Bilirubin: 0.8 mg/dL (ref 0.3–0.9)
Total Bilirubin: 1.8 mg/dL — ABNORMAL HIGH (ref 0.3–1.2)
Total Protein: 5 g/dL — ABNORMAL LOW (ref 6.5–8.1)

## 2021-05-10 LAB — GLUCOSE, CAPILLARY
Glucose-Capillary: 121 mg/dL — ABNORMAL HIGH (ref 70–99)
Glucose-Capillary: 103 mg/dL — ABNORMAL HIGH (ref 70–99)
Glucose-Capillary: 107 mg/dL — ABNORMAL HIGH (ref 70–99)
Glucose-Capillary: 25 mg/dL — CL (ref 70–99)
Glucose-Capillary: 169 mg/dL — ABNORMAL HIGH (ref 70–99)

## 2021-05-10 LAB — MAGNESIUM
Magnesium: 3 mg/dL — ABNORMAL HIGH (ref 1.7–2.4)
Magnesium: 3.6 mg/dL — ABNORMAL HIGH (ref 1.7–2.4)

## 2021-05-10 LAB — PHOSPHORUS
Phosphorus: 8.9 mg/dL — ABNORMAL HIGH (ref 2.5–4.6)
Phosphorus: 17.5 mg/dL — ABNORMAL HIGH (ref 2.5–4.6)

## 2021-05-10 LAB — SURGICAL PATHOLOGY

## 2021-05-10 LAB — PREPARE RBC (CROSSMATCH)

## 2021-05-10 SURGERY — IRRIGATION AND DEBRIDEMENT WOUND
Anesthesia: General | Laterality: Left

## 2021-05-10 MED ORDER — LIDOCAINE HCL (CARDIAC) PF 100 MG/5ML IV SOSY
PREFILLED_SYRINGE | INTRAVENOUS | Status: DC | PRN
  Administered 2021-05-10: 100 mg via INTRAVENOUS

## 2021-05-10 MED ORDER — PHENYLEPHRINE 40 MCG/ML (10ML) SYRINGE FOR IV PUSH (FOR BLOOD PRESSURE SUPPORT)
PREFILLED_SYRINGE | INTRAVENOUS | Status: DC | PRN
  Administered 2021-05-10 (×3): 160 ug via INTRAVENOUS

## 2021-05-10 MED ORDER — EPINEPHRINE HCL 5 MG/250ML IV SOLN IN NS
0.5000 ug/min | INTRAVENOUS | Status: DC
  Administered 2021-05-10: 0.5 ug/min via INTRAVENOUS
  Filled 2021-05-10: qty 250

## 2021-05-10 MED ORDER — SUCCINYLCHOLINE CHLORIDE 200 MG/10ML IV SOSY
PREFILLED_SYRINGE | INTRAVENOUS | Status: DC | PRN
  Administered 2021-05-10: 100 mg via INTRAVENOUS

## 2021-05-10 MED ORDER — SODIUM CHLORIDE 0.9% IV SOLUTION: Freq: Once | INTRAVENOUS | Status: AC

## 2021-05-10 MED ORDER — PIVOT 1.5 CAL PO LIQD
1000.0000 mL | ORAL | Status: DC
  Filled 2021-05-10: qty 1000

## 2021-05-10 MED ORDER — IPRATROPIUM-ALBUTEROL 0.5-2.5 (3) MG/3ML IN SOLN
3.0000 mL | Freq: Four times a day (QID) | RESPIRATORY_TRACT | Status: DC
  Administered 2021-05-11 – 2021-05-15 (×17): 3 mL via RESPIRATORY_TRACT
  Filled 2021-05-10 (×18): qty 3

## 2021-05-10 MED ORDER — STERILE WATER FOR INJECTION IV SOLN
INTRAVENOUS | Status: DC
  Filled 2021-05-10: qty 1000
  Filled 2021-05-10: qty 150
  Filled 2021-05-10 (×3): qty 1000
  Filled 2021-05-10: qty 150
  Filled 2021-05-10: qty 1000
  Filled 2021-05-10: qty 150

## 2021-05-10 MED ORDER — INSULIN ASPART 100 UNIT/ML IV SOLN
10.0000 [IU] | Freq: Once | INTRAVENOUS | Status: AC
  Administered 2021-05-10: 10 [IU] via INTRAVENOUS
  Filled 2021-05-10: qty 0.1

## 2021-05-10 MED ORDER — PROPOFOL 10 MG/ML IV BOLUS
INTRAVENOUS | Status: DC | PRN
  Administered 2021-05-10: 100 mg via INTRAVENOUS

## 2021-05-10 MED ORDER — FREE WATER
30.0000 mL | Status: DC
  Administered 2021-05-12 – 2021-05-13 (×8): 30 mL

## 2021-05-10 MED ORDER — SODIUM CHLORIDE 0.9 % IV BOLUS
500.0000 mL | Freq: Once | INTRAVENOUS | Status: AC
  Administered 2021-05-10: 500 mL via INTRAVENOUS

## 2021-05-10 MED ORDER — DOPAMINE-DEXTROSE 3.2-5 MG/ML-% IV SOLN
0.0000 ug/kg/min | INTRAVENOUS | Status: DC
  Administered 2021-05-11 (×3): 20 ug/kg/min via INTRAVENOUS
  Filled 2021-05-10 (×5): qty 250

## 2021-05-10 MED ORDER — SODIUM BICARBONATE 8.4 % IV SOLN
INTRAVENOUS | Status: AC
  Administered 2021-05-10: 50 meq via INTRAVENOUS
  Filled 2021-05-10: qty 50

## 2021-05-10 MED ORDER — LIDOCAINE HCL (PF) 2 % IJ SOLN
INTRAMUSCULAR | Status: AC
  Filled 2021-05-10: qty 5

## 2021-05-10 MED ORDER — VASOPRESSIN 20 UNIT/ML IV SOLN
INTRAVENOUS | Status: DC | PRN
Start: 2021-05-10 — End: 2021-05-10
  Administered 2021-05-10: 2 [IU] via INTRAVENOUS
  Administered 2021-05-10: 1 [IU] via INTRAVENOUS
  Administered 2021-05-10 (×2): 2 [IU] via INTRAVENOUS

## 2021-05-10 MED ORDER — CALCIUM GLUCONATE-NACL 2-0.675 GM/100ML-% IV SOLN
2.0000 g | Freq: Once | INTRAVENOUS | Status: AC
  Administered 2021-05-10: 2000 mg via INTRAVENOUS
  Filled 2021-05-10: qty 100

## 2021-05-10 MED ORDER — VASOPRESSIN 20 UNITS/100 ML INFUSION FOR SHOCK
0.0000 [IU]/min | INTRAVENOUS | Status: DC
  Administered 2021-05-10: 0.04 [IU]/min via INTRAVENOUS
  Administered 2021-05-10: 0.03 [IU]/min via INTRAVENOUS
  Administered 2021-05-11 – 2021-05-12 (×5): 0.04 [IU]/min via INTRAVENOUS
  Administered 2021-05-13: 0.02 [IU]/min via INTRAVENOUS
  Administered 2021-05-13: 0.04 [IU]/min via INTRAVENOUS
  Administered 2021-05-14: 0.02 [IU]/min via INTRAVENOUS
  Filled 2021-05-10 (×10): qty 100

## 2021-05-10 MED ORDER — AMIODARONE HCL 200 MG PO TABS: 400.0000 mg | ORAL_TABLET | Freq: Two times a day (BID) | ORAL | Status: DC

## 2021-05-10 MED ORDER — SODIUM CHLORIDE 0.9 % IV BOLUS
1000.0000 mL | Freq: Once | INTRAVENOUS | Status: AC
  Administered 2021-05-10: 1000 mL via INTRAVENOUS

## 2021-05-10 MED ORDER — ETOMIDATE 2 MG/ML IV SOLN
20.0000 mg | Freq: Once | INTRAVENOUS | Status: AC
  Administered 2021-05-10: 20 mg via INTRAVENOUS

## 2021-05-10 MED ORDER — AMIODARONE HCL 200 MG PO TABS: 200.0000 mg | ORAL_TABLET | Freq: Every day | ORAL | Status: DC

## 2021-05-10 MED ORDER — ETOMIDATE 2 MG/ML IV SOLN
INTRAVENOUS | Status: AC
  Filled 2021-05-10: qty 20

## 2021-05-10 MED ORDER — VECURONIUM BROMIDE 10 MG IV SOLR
INTRAVENOUS | Status: AC
  Filled 2021-05-10: qty 10

## 2021-05-10 MED ORDER — SODIUM CHLORIDE 0.9 % IV SOLN
0.5000 ug/min | INTRAVENOUS | Status: DC
  Administered 2021-05-11 – 2021-05-12 (×4): 20 ug/min via INTRAVENOUS
  Administered 2021-05-12: 12 ug/min via INTRAVENOUS
  Filled 2021-05-10 (×7): qty 10

## 2021-05-10 MED ORDER — SODIUM CHLORIDE 0.9 % IV SOLN
0.5000 mg/h | INTRAVENOUS | Status: DC
  Administered 2021-05-10: 0.58 mg/h via INTRAVENOUS
  Administered 2021-05-11: 0.25 mg/h via INTRAVENOUS
  Filled 2021-05-10: qty 5

## 2021-05-10 MED ORDER — HYDROMORPHONE HCL 1 MG/ML IJ SOLN
0.5000 mg | Freq: Once | INTRAMUSCULAR | Status: AC
  Administered 2021-05-11: 0.5 mg via INTRAVENOUS
  Filled 2021-05-10: qty 1

## 2021-05-10 MED ORDER — SODIUM BICARBONATE 8.4 % IV SOLN: 100.0000 meq | Freq: Once | INTRAVENOUS | Status: AC

## 2021-05-10 MED ORDER — SODIUM BICARBONATE 8.4 % IV SOLN
INTRAVENOUS | Status: AC
  Administered 2021-05-10: 100 meq via INTRAVENOUS
  Filled 2021-05-10: qty 100

## 2021-05-10 MED ORDER — FENTANYL CITRATE (PF) 100 MCG/2ML IJ SOLN
INTRAMUSCULAR | Status: AC
  Filled 2021-05-10: qty 2

## 2021-05-10 MED ORDER — SODIUM BICARBONATE 8.4 % IV SOLN: INTRAVENOUS | Status: DC

## 2021-05-10 MED ORDER — ONDANSETRON HCL 4 MG/2ML IJ SOLN
INTRAMUSCULAR | Status: DC | PRN
  Administered 2021-05-10: 4 mg via INTRAVENOUS

## 2021-05-10 MED ORDER — PROPOFOL 1000 MG/100ML IV EMUL
0.0000 ug/kg/min | INTRAVENOUS | Status: DC
  Filled 2021-05-10: qty 100

## 2021-05-10 MED ORDER — MIDAZOLAM HCL 2 MG/2ML IJ SOLN
4.0000 mg | Freq: Once | INTRAMUSCULAR | Status: AC
  Administered 2021-05-10: 4 mg via INTRAVENOUS

## 2021-05-10 MED ORDER — DOPAMINE-DEXTROSE 3.2-5 MG/ML-% IV SOLN
INTRAVENOUS | Status: AC
  Administered 2021-05-10: 20 ug/kg/min via INTRAVENOUS
  Filled 2021-05-10: qty 250

## 2021-05-10 MED ORDER — MIDAZOLAM HCL 2 MG/2ML IJ SOLN
INTRAMUSCULAR | Status: AC
  Filled 2021-05-10: qty 4

## 2021-05-10 MED ORDER — VECURONIUM BROMIDE 10 MG IV SOLR: 10.0000 mg | Freq: Once | INTRAVENOUS | Status: AC

## 2021-05-10 MED ORDER — ONDANSETRON HCL 4 MG/2ML IJ SOLN
4.0000 mg | Freq: Four times a day (QID) | INTRAMUSCULAR | Status: DC | PRN
  Administered 2021-05-19 – 2021-06-10 (×5): 4 mg via INTRAVENOUS
  Filled 2021-05-10 (×5): qty 2

## 2021-05-10 MED ORDER — JUVEN PO PACK: 1.0000 | PACK | Freq: Two times a day (BID) | ORAL | Status: DC

## 2021-05-10 MED ORDER — DEXTROSE 50 % IV SOLN
1.0000 | Freq: Once | INTRAVENOUS | Status: AC
Start: 2021-05-10 — End: 2021-05-10
  Filled 2021-05-10: qty 50

## 2021-05-10 MED ORDER — DEXTROSE 50 % IV SOLN
INTRAVENOUS | Status: AC
  Administered 2021-05-10: 50 mL via INTRAVENOUS
  Filled 2021-05-10: qty 50

## 2021-05-10 MED ORDER — HYDROMORPHONE BOLUS VIA INFUSION
0.2500 mg | INTRAVENOUS | Status: DC | PRN
  Administered 2021-05-10 – 2021-05-13 (×5): 0.25 mg via INTRAVENOUS
  Filled 2021-05-10: qty 1

## 2021-05-10 MED ORDER — FENTANYL CITRATE (PF) 100 MCG/2ML IJ SOLN
INTRAMUSCULAR | Status: DC | PRN
Start: 2021-05-10 — End: 2021-05-10
  Administered 2021-05-10 (×4): 25 ug via INTRAVENOUS

## 2021-05-10 MED ORDER — SODIUM ZIRCONIUM CYCLOSILICATE 5 G PO PACK
10.0000 g | PACK | Freq: Three times a day (TID) | ORAL | Status: DC
  Administered 2021-05-10: 10 g
  Filled 2021-05-10: qty 2

## 2021-05-10 MED ORDER — FENTANYL CITRATE (PF) 100 MCG/2ML IJ SOLN
100.0000 ug | Freq: Once | INTRAMUSCULAR | Status: AC
  Administered 2021-05-10: 100 ug via INTRAVENOUS

## 2021-05-10 MED ORDER — DEXTROSE 10 % IV SOLN: INTRAVENOUS | Status: DC

## 2021-05-10 MED ORDER — SODIUM BICARBONATE 8.4 % IV SOLN: 50.0000 meq | Freq: Once | INTRAVENOUS | Status: AC

## 2021-05-10 MED ORDER — ONDANSETRON HCL 4 MG/2ML IJ SOLN
INTRAMUSCULAR | Status: AC
  Administered 2021-05-10: 4 mg via INTRAVENOUS
  Filled 2021-05-10: qty 2

## 2021-05-10 MED ORDER — VASOPRESSIN 20 UNIT/ML IV SOLN
INTRAVENOUS | Status: AC
  Filled 2021-05-10: qty 1

## 2021-05-10 SURGICAL SUPPLY — 30 items
BLADE CLIPPER SURG (BLADE) ×3 IMPLANT
BLADE SURG 15 STRL LF DISP TIS (BLADE) ×2 IMPLANT
BLADE SURG 15 STRL SS (BLADE) ×3
BRUSH SCRUB EZ  4% CHG (MISCELLANEOUS) ×3
BRUSH SCRUB EZ 4% CHG (MISCELLANEOUS) ×2 IMPLANT
CANISTER WOUND CARE 500ML ATS (WOUND CARE) ×1 IMPLANT
DRAPE 3/4 80X56 (DRAPES) ×2 IMPLANT
DRAPE CHEST BREAST 77X106 FENE (MISCELLANEOUS) IMPLANT
DRAPE LAPAROTOMY 77X122 PED (DRAPES) ×3 IMPLANT
DRSG MEPITEL 4X7.2 (GAUZE/BANDAGES/DRESSINGS) ×2 IMPLANT
ELECT REM PT RETURN 9FT ADLT (ELECTROSURGICAL) ×3
ELECTRODE REM PT RTRN 9FT ADLT (ELECTROSURGICAL) ×2 IMPLANT
GAUZE 4X4 16PLY ~~LOC~~+RFID DBL (SPONGE) ×3 IMPLANT
GLOVE SURG ENC MOIS LTX SZ6.5 (GLOVE) ×3 IMPLANT
GLOVE SURG UNDER POLY LF SZ6.5 (GLOVE) ×3 IMPLANT
GOWN SRG LRG LVL 4 IMPRV REINF (GOWNS) IMPLANT
GOWN STRL REIN LRG LVL4 (GOWNS) ×12
GOWN STRL REUS W/ TWL LRG LVL3 (GOWN DISPOSABLE) ×4 IMPLANT
GOWN STRL REUS W/TWL LRG LVL3 (GOWN DISPOSABLE) ×12
HANDLE YANKAUER SUCT BULB TIP (MISCELLANEOUS) ×1 IMPLANT
IV NS 1000ML (IV SOLUTION) ×3
IV NS 1000ML BAXH (IV SOLUTION) IMPLANT
MANIFOLD NEPTUNE II (INSTRUMENTS) ×3 IMPLANT
NEEDLE HYPO 22GX1.5 SAFETY (NEEDLE) ×3 IMPLANT
NS IRRIG 1000ML POUR BTL (IV SOLUTION) ×3 IMPLANT
PACK BASIN MINOR ARMC (MISCELLANEOUS) ×3 IMPLANT
SOL PREP PVP 2OZ (MISCELLANEOUS) ×6
SOLUTION PREP PVP 2OZ (MISCELLANEOUS) ×4 IMPLANT
SPONGE T-LAP 18X18 ~~LOC~~+RFID (SPONGE) ×6 IMPLANT
WATER STERILE IRR 500ML POUR (IV SOLUTION) ×3 IMPLANT

## 2021-05-10 NOTE — Op Note (Signed)
ATTENDING Surgeon(s): Herbert Pun, MD   ANESTHESIA: General   PRE-OPERATIVE DIAGNOSIS: Left leg necrotizing fasciitis   POST-OPERATIVE DIAGNOSIS: Same   PROCEDURE(S):  1.) Sharp excisional debridement of left leg 2.) Veraflow (negative pressure dressing (DME) placement)     INTRAOPERATIVE FINDINGS:  Pre Operative measurement 30 x 30 cm area of cellulitis  Post operative: A 40 x 30 cm (1200 sq cm) area of debridement into tidial bone anteriorly and into gastrocnemius muscle posteriorly.     ESTIMATED BLOOD LOSS: 100 mL    SPECIMENS: left leg necrotizing fasciitis   COMPLICATIONS: None apparent   CONDITION AT END OF PROCEDURE: Hemodynamically stable and awake   INDICATIONS FOR PROCEDURE:  Patient is a 72 year old female with left leg necrotizing fasciitis with ischemic posterior skin and persistent elevated WBC count.    DETAILS OF PROCEDURE: After informed consent,patient was taken to the OR. Time out performed. General anesthesia induced. Patient placed on supine position. The left leg  was cleaned and draped in sterile fashion. With electrocautery, necrotic posterior skin and dermal tissue was resected. Ischemic fat tissue, muscle and bone were debrided down to the tibial bone . Pulse lavage was used for debridement and irrigation. Hemostasis achieved. I personally placed the Wound VAC (Veraflow system) covering the 40 x 30 cm (1200 sq cm) area. Patient tolerated the procedure well.    Herbert Pun, MD, FACS

## 2021-05-10 NOTE — Consult Note (Signed)
PHARMACY CONSULT NOTE - FOLLOW UP  Pharmacy Consult for Electrolyte Monitoring and Replacement   Recent Labs: Potassium (mmol/L)  Date Value  05/10/2021 4.6  08/11/2013 3.3 (L)   Magnesium (mg/dL)  Date Value  05/10/2021 3.0 (H)   Calcium (mg/dL)  Date Value  05/10/2021 7.5 (L)   Calcium, Total (mg/dL)  Date Value  08/11/2013 9.2   Albumin (g/dL)  Date Value  05/08/2021 <1.5 (L)  08/11/2013 3.1 (L)   Phosphorus (mg/dL)  Date Value  05/10/2021 8.9 (H)   Sodium (mmol/L)  Date Value  05/10/2021 133 (L)  08/11/2013 138     Assessment: Pharmacy has been consulted to monitor and replace electrolytes in 71 yo patient with severe sepsis and strep pyogenes bacteremia.    Goal of Therapy:  Electrolytes WNL  Plan:  --No replacement indicated at this time --Continue to follow along  Tawnya Crook, PharmD, BCPS Clinical Pharmacist 05/10/2021 11:46 AM

## 2021-05-10 NOTE — Transfer of Care (Addendum)
Immediate Anesthesia Transfer of Care Note  Patient: Alexandra Foster  Procedure(s) Performed: IRRIGATION AND DEBRIDEMENT LEFT LEG (Left) APPLICATION OF WOUND VAC  Patient Location: ICU  Anesthesia Type:General  Level of Consciousness: awake, alert  and oriented  Airway & Oxygen Therapy: Patient Spontanous Breathing and Patient connected to nasal cannula oxygen  Post-op Assessment: Report given to RN and Post -op Vital signs reviewed and stable  Post vital signs: Reviewed and stable  Last Vitals:  Vitals Value Taken Time  BP 115/85 05/10/21 1415  Temp    Pulse 94 05/10/21 1416  Resp 17 05/10/21 1416  SpO2 99 % 05/10/21 1416  Vitals shown include unvalidated device data.  Last Pain:  Vitals:   05/10/21 1215  TempSrc:   PainSc: Asleep         Complications: No notable events documented.

## 2021-05-10 NOTE — Progress Notes (Addendum)
NAME:  Alexandra Foster, MRN:  [id number], DOB:  Feb 02, 1951, LOS: 6 ADMISSION DATE:  05/04/2021   BRIEF SYNOPSIS 71 y.o female with medical history significant of Asthma/COPD, T2DM, HTN, OSA on CPAP, GERD, COVID-19 pneumonia, and Bilateral lower extremity edema who presented to the ED with chief complaints of LLE pain and chills since Thursday.   Patient given 30 cc/kg of fluids and started on broad-spectrum antibiotics for sepsis with septic shock. Patient remained hypotensive despite IVF boluses therefore was started on Levophed. PCCM consulted.  NOW WITH DX OF SEVERE SEPTIC SHOCK FROM NECROTIZING FASCIITIS  Past Medical History     Arthritis   Asthma   COPD (chronic obstructive pulmonary disease) ([place])   Diabetes mellitus without complication ([place])   Endometriosis   GERD (gastroesophageal reflux disease)   Hypertension   Obesity   Sleep apnea    CPAP    Significant Hospital Events   05/04/21: Admitted to the ICU with severe sepsis with shock secondary to cellulitis of LLE 1/23 off pressors +Afib with RVR 1/24 blood pressure low  1/24 to OR for fasciotomy 1/25 post op resp failure with severe septic shock 1/26 remains on pressors 1/27 returns to the OR today for debridement, wound VAC   Significant Diagnostic Tests:  1/21: Chest Xray> no active cardiopulmonary process 1/21: CT left lower leg>Diffuse subcutaneous edema may represent cellulitis. No drainable fluid collection/abscess 1/21: Ultrasound lower unilateral left> no DVT   Micro Data:  1/21: SARS-CoV-2 PCR> negative 1/21: Influenza PCR> negative 1/21: Blood culture x2> Streptococcus pyogenes 1/21: Urine Culture> negative 1/21: MRSA PCR>> negative   Antimicrobials:  Vancomycin 1/21> Cefepime 1/21> Ceftriaxone 1/21 >>1/25 Penicilli G 1/24>> Linezolid 1/25>>   *Antimicrobials per ID Interim History / Subjective:  Patient with acute ned fasciitis with emergent fasciotomy Critically ill remains on  pressors Prognosis is poor Return to the OR 1/27  Objective   Blood pressure 115/85, pulse 95, temperature 98.7 F (37.1 C), temperature source Axillary, resp. rate 14, height 5\' 5"  (1.651 m), weight 123.4 kg, SpO2 99 %.        Intake/Output Summary (Last 24 hours) at 05/10/2021 1611 Last data filed at 05/10/2021 1350 Gross per 24 hour  Intake 843.97 ml  Output 840 ml  Net 3.97 ml    Filed Weights   05/04/21 1941 05/05/21 1100  Weight: 120.7 kg 123.4 kg   LIMITED ROS due to lethargy    PHYSICAL EXAMINATION:  GENERAL: Obese woman, critically ill appearing, does respond but slow to do so, lethargic. NECK: Supple.  Trachea midline, no stridor. PULMONARY: Coarse breath sounds, no wheezes.   CARDIOVASCULAR: S1 and S2.  No murmurs  GASTROINTESTINAL: Obese, soft, nontender, non-distended. Positive bowel sounds.  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  GAS bacteremia/sepsis/septic shock   GAS SEPTIC shock SOURCE-LLE, necrotizing fasciitis Volume challenge Continues to be pressor dependent: Norepinephrine/vasopressin Maintain MAP> 65 Check Cortisol level Source control by surgical intervention   CARDIAC new onset afib wth RVR ICU monitoring On amiodarone  Normal sinus currently  ACUTE KIDNEY INJURY/Renal Failure ATN/septic shock -continue Foley Catheter-assess need -Avoid nephrotoxic agents -Follow urine output, BMP -Ensure  adequate renal perfusion, optimize oxygenation -Renal dose medications   Intake/Output Summary (Last 24 hours) at 05/10/2021 1611 Last data filed at  05/10/2021 1350 Gross per 24 hour  Intake 843.97 ml  Output 840 ml  Net 3.97 ml      BMP Latest Ref Rng & Units 05/10/2021 05/09/2021 05/08/2021  Glucose 70 - 99 mg/dL 145(H) 119(H) 168(H)  BUN 8 - 23 mg/dL 83(H) 77(H) 71(H)  Creatinine 0.44 - 1.00 mg/dL 3.68(H) 3.88(H) 3.48(H)  BUN/Creat Ratio 6 - 22 (calc) - - -  Sodium 135 - 145 mmol/L 133(L) 131(L) 134(L)  Potassium 3.5 - 5.1 mmol/L 4.6 4.3 4.1  Chloride 98 - 111 mmol/L 101 98 100  CO2 22 - 32 mmol/L 19(L) 21(L) 21(L)  Calcium 8.9 - 10.3 mg/dL 7.5(L) 7.2(L) 7.5(L)    INFECTIOUS DISEASE GAS bacteremia/severe sepsis Necrotizing fasciitis -continue antibiotics as prescribed -follow up cultures -follow up ID consultation -Continue surgical management of necrotizing fasciitis   ENDO - ICU hypoglycemic\Hyperglycemia protocol -check FSBS per protocol   GI GI PROPHYLAXIS as indicated  NUTRITIONAL STATUS DIET-->as tolerated Constipation protocol as indicated May need Dobbhoff placed if p.o. intake remains marginal   ELECTROLYTES -follow labs as needed -replace as needed -pharmacy consulting and following  Anemia, multifactorial: Acute blood loss, critical illness Transfuse 1 unit PRBCs as she is below threshold today No evidence of active bleeding    Best practice:  Diet:  Oral Pain/Anxiety/Delirium protocol (if indicated): No VAP protocol (if indicated): Not indicated DVT prophylaxis: Subcutaneous Heparin GI prophylaxis: PPI Glucose control:  SSI Yes Central venous access:  N/A Arterial line:  N/A Foley:  N/A Mobility:  bed rest  PT consulted: N/A Last date of multidisciplinary goals of care discussion [1/21] Code Status:  full code Disposition: ICU   Labs   CBC: Recent Labs  Lab 05/04/21 1943 05/05/21 0454 05/06/21 0325 05/07/21 0821 05/08/21 0450 05/09/21 0545 05/10/21 0450  WBC 16.5*   < > 16.2* 21.9* 23.7* 22.2* 23.6*  NEUTROABS 15.5*  --   --   --   --   --   --   HGB 12.5   < > 10.5*  9.7* 9.5* 8.1* 6.2*  HCT 40.8   < > 33.4* 29.9* 27.7* 23.0* 17.8*  MCV 78.5*   < > 75.9* 73.3* 75.9* 74.4* 75.1*  PLT 282   < > 240 294 270 257 317   < > = values in this interval not displayed.     Basic Metabolic Panel: Recent Labs  Lab 05/06/21 0325 05/07/21 0821 05/08/21 0450 05/09/21 0545 05/10/21 0450  NA 138 134* 134* 131* 133*  K 4.2 3.4* 4.1 4.3 4.6  CL 104 98 100 98 101  CO2 23 22 21* 21* 19*  GLUCOSE 189* 148* 168* 119* 145*  BUN 60* 69* 71* 77* 83*  CREATININE 3.46* 3.32* 3.48* 3.88* 3.68*  CALCIUM 7.4* 7.0* 7.5* 7.2* 7.5*  MG 2.4 2.5* 2.5* 2.7* 3.0*  PHOS 4.4 4.5 7.7* 7.8* 8.9*    GFR: Estimated Creatinine Clearance: 18.2 mL/min (A) (by C-G formula based on SCr of 3.68 mg/dL (H)). Recent Labs  Lab 05/04/21 2130 05/04/21 2150 05/05/21 0454 05/05/21 1153 05/05/21 1737 05/05/21 2017 05/06/21 0325 05/07/21 0821 05/07/21 1046 05/08/21 0450 05/09/21 0545 05/10/21 0450  PROCALCITON 35.76  --  40.92  --   --   --  66.16  --   --   --   --   --   WBC  --   --  17.6*  --   --   --  16.2* 21.9*  --  23.7* 22.2* 23.6*  LATICACIDVEN  --    < >  --    < > 4.1* 2.9*  --  1.7 1.7  --   --   --    < > = values in this interval not displayed.     Liver Function Tests: Recent Labs  Lab 05/04/21 1943 05/08/21 1313 05/10/21 0450  AST 67* 136* 97*  ALT 48* 47* 36  ALKPHOS 103 135* 145*  BILITOT 1.1 3.8* 1.8*  PROT 7.9 4.8* 5.0*  ALBUMIN 2.8* <1.5* <1.5*    Coagulation Profile: Recent Labs  Lab 05/05/21 0027  INR 1.3*     Cardiac Enzymes: Recent Labs  Lab 05/08/21 1313  CKTOTAL 1,575*     HbA1C: Hgb A1c MFr Bld  Date/Time Value Ref Range Status  05/05/2021 04:53 AM 6.7 (H) 4.8 - 5.6 % Final    Comment:    (NOTE)         Prediabetes: 5.7 - 6.4         Diabetes: >6.4         Glycemic control for adults with diabetes: <7.0   02/11/2021 09:41 AM 6.3 (H) <5.7 % of total Hgb Final    Comment:    For someone without known diabetes, a  hemoglobin  A1c value between 5.7% and 6.4% is consistent with prediabetes and should be confirmed with a  follow-up test. . For someone with known diabetes, a value <7% indicates that their diabetes is well controlled. A1c targets should be individualized based on duration of diabetes, age, comorbid conditions, and other considerations. . This assay result is consistent with an increased risk of diabetes. . Currently, no consensus exists regarding use of hemoglobin A1c for diagnosis of diabetes for children. .     CBG: Recent Labs  Lab 05/09/21 1501 05/09/21 2103 05/10/21 0750 05/10/21 1112 05/10/21 1554  GLUCAP 111* 128* 121* 103* 107*    No Known Allergies   Medications  Scheduled Meds:  [START ON 05/15/2021] amiodarone  200 mg Oral Daily   amiodarone  400 mg Oral BID   vitamin C  500 mg Oral BID   atorvastatin  40 mg Oral Daily   Chlorhexidine Gluconate Cloth  6 each Topical Q0600   docusate  100 mg Per Tube BID   free water  30 mL Per Tube Q4H   heparin injection (subcutaneous)  5,000 Units Subcutaneous Q8H   insulin aspart  0-5 Units Subcutaneous QHS   insulin aspart  0-9 Units Subcutaneous TID WC   midodrine  10 mg Oral TID WC   mometasone-formoterol  2 puff Inhalation BID   montelukast  10 mg Oral QHS   multivitamin with minerals  1 tablet Oral Daily   [START ON 05/11/2021] nutrition supplement (JUVEN)  1 packet Oral BID BM   pantoprazole  40 mg Oral Daily   polyethylene glycol  17 g Per Tube Daily   Ensure Max Protein  11 oz Oral BID   sodium chloride flush  10-40 mL Intracatheter Q12H   sodium hypochlorite  1 application Irrigation Daily   umeclidinium bromide  1 puff Inhalation Daily   zinc sulfate  220 mg Oral Daily   Continuous Infusions:  sodium chloride     feeding supplement (PIVOT 1.5 CAL)     linezolid (ZYVOX) IV 600 mg (05/10/21 1127)   norepinephrine (LEVOPHED) Adult infusion 22 mcg/min (05/10/21 1335)  pencillin G potassium IV 3  Million Units (05/10/21 1626)   vasopressin 0.03 Units/min (05/10/21 1631)   PRN Meds:.docusate sodium, HYDROmorphone (DILAUDID) injection, lip balm, ondansetron (ZOFRAN) IV, oxyCODONE, polyethylene glycol, sodium chloride flush   CASE DISCUSSED IN MULTIDISCIPLINARY ROUNDS WITH ICU TEAM.  Updated patient's husband via phone.   Critical Care Time devoted to patient care services described in this note is 45 minutes.  Critical care was necessary to treat /prevent imminent and life-threatening deterioration. Overall, patient is critically ill, prognosis is guarded.  Patient with Multiorgan failure and at high risk for cardiac arrest and death.   Renold Don, MD Advanced Bronchoscopy PCCM [facility]    *This note was dictated using voice recognition software/Dragon.  Despite best efforts to proofread, errors can occur which can change the meaning. Any transcriptional errors that result from this process are unintentional and may not be fully corrected at the time of dictation.

## 2021-05-10 NOTE — Anesthesia Procedure Notes (Signed)
Procedure Name: Intubation Date/Time: 05/10/2021 12:35 PM Performed by: Demetrius Charity, CRNA Pre-anesthesia Checklist: Patient identified, Patient being monitored, Timeout performed, Emergency Drugs available and Suction available Patient Re-evaluated:Patient Re-evaluated prior to induction Oxygen Delivery Method: Circle system utilized Preoxygenation: Pre-oxygenation with 100% oxygen Induction Type: IV induction Ventilation: Mask ventilation without difficulty Laryngoscope Size: 3 and McGraph Grade View: Grade I Tube type: Oral Tube size: 6.5 mm Number of attempts: 1 Airway Equipment and Method: Stylet Placement Confirmation: ETT inserted through vocal cords under direct vision, positive ETCO2 and breath sounds checked- equal and bilateral Secured at: 21 cm Tube secured with: Tape Dental Injury: Teeth and Oropharynx as per pre-operative assessment

## 2021-05-10 NOTE — Progress Notes (Addendum)
Date of Admission:  05/04/2021    ID: Alexandra Foster is a 71 y.o. female  Principal Problem:   Severe sepsis with lactic acidosis (HCC)    Subjective: She is just back from OR  sedated    Objective: Vital signs in last 24 hours:Patient Vitals for the past 24 hrs:  BP Temp Temp src Pulse Resp SpO2  05/10/21 1200 135/61 -- -- 93 (!) 21 98 %  05/10/21 1138 (!) 100/48 98.7 F (37.1 C) Axillary 93 18 99 %  05/10/21 1130 (!) 100/48 -- -- 94 15 98 %  05/10/21 1121 (!) 96/57 98.6 F (37 C) Axillary 92 19 99 %  05/10/21 0900 (!) 100/46 -- -- 95 20 99 %  05/10/21 0804 -- -- -- 92 19 96 %  05/10/21 0800 (!) 93/56 98.7 F (37.1 C) Axillary 98 (!) 9 97 %  05/10/21 0700 (!) 91/57 -- -- 94 19 100 %  05/10/21 0600 101/73 -- -- 97 (!) 24 100 %  05/10/21 0500 (!) 99/59 -- -- 94 (!) 21 98 %  05/10/21 0400 (!) 90/52 98.8 F (37.1 C) Oral 94 (!) 24 95 %  05/10/21 0300 (!) 111/53 -- -- 95 (!) 23 100 %  05/10/21 0200 (!) 103/55 -- -- 93 20 100 %  05/10/21 0100 (!) 108/54 -- -- 97 19 100 %  05/10/21 0000 108/70 99 F (37.2 C) Oral 93 (!) 22 100 %  05/09/21 2300 (!) 99/50 -- -- 94 20 98 %  05/09/21 2200 (!) 90/54 -- -- 94 20 98 %  05/09/21 2100 (!) 92/51 -- -- 93 17 98 %  05/09/21 2000 (!) 93/53 98.6 F (37 C) Oral 93 18 100 %  05/09/21 1830 (!) 103/50 -- -- 92 20 100 %  05/09/21 1815 (!) 97/48 -- -- 90 16 98 %  05/09/21 1800 (!) 95/48 -- -- 88 18 98 %  05/09/21 1745 (!) 97/48 -- -- 89 16 98 %  05/09/21 1730 99/60 -- -- 89 15 99 %  05/09/21 1715 (!) 95/53 -- -- 88 16 97 %  05/09/21 1700 (!) 95/56 -- -- 88 18 100 %  05/09/21 1645 (!) 87/53 -- -- 87 15 97 %  05/09/21 1630 (!) 90/52 -- -- 87 14 97 %  05/09/21 1615 (!) 88/52 -- -- 88 16 97 %  05/09/21 1600 (!) 95/54 98.1 F (36.7 C) Oral 88 15 97 %  05/09/21 1545 (!) 100/55 -- -- 87 14 99 %  05/09/21 1530 (!) 92/54 -- -- 85 16 98 %  05/09/21 1515 (!) 91/54 -- -- 86 15 98 %  05/09/21 1500 (!) 106/52 -- -- 87 16 98 %  05/09/21 1445  (!) 101/50 -- -- 87 14 98 %  05/09/21 1430 (!) 76/55 -- -- 86 16 98 %  05/09/21 1415 (!) 109/56 -- -- 85 15 96 %  05/09/21 1400 (!) 101/56 -- -- 88 15 98 %  05/09/21 1345 (!) 92/58 -- -- 86 16 98 %  05/09/21 1330 (!) 99/52 -- -- 88 17 98 %  05/09/21 1315 (!) 100/55 -- -- 88 18 97 %  05/09/21 1300 (!) 121/59 -- -- 88 16 97 %   PHYSICAL EXAM:  General: somnolent Neck:RT IJ central line  Lungs: b/l air entry Heart: s1s2 , HR better controlled  Abdomen: Soft, non-tender,not distended. Bowel sounds normal. No masses Foley  Extremities:left leg  Today the leg has wound vac Post debridement 05/09/21  05/07/21   On admission   Skin: No rashes or lesions. Or bruising Lymph: Cervical, supraclavicular normal. Neurologic: cannot be assessed  Lab Results Recent Labs    05/09/21 0545 05/10/21 0450  WBC 22.2* 23.6*  HGB 8.1* 6.2*  HCT 23.0* 17.8*  NA 131* 133*  K 4.3 4.6  CL 98 101  CO2 21* 19*  BUN 77* 83*  CREATININE 3.88* 3.68*   Microbiology: 05/04/21 [facility] Group A strep 4/4 1of 4 staph epidermidis- a contaminant 05/09/21 [facility]  Assessment/Plan:  Streptococcus pyogenes bacteremia with severe sepsis Secondary to left leg necrotizing celluliits>fascitis S/p debridement - today was taken back to OR and had wound vac Pt is currently on penicillin and linezolid .   Leucocytosis  AKI due to sepsis- improving Discussed with intensivist  /nephrologist on wed-- Discussed IVIG risk VS benefit- currently risk outweighs benefit with risk for thrombosis, hypervisocosity and worsening renal function  Anemia- got PRBC  DM on sliding scale Afib now in sinus rhythm- on Amiodarone  Transaminitis/hyperbilirubinemia- improving Severe hypoalbuminemia  Discussed the management with care team ID will follow her peripherally this weekend  Call if needed

## 2021-05-10 NOTE — Anesthesia Postprocedure Evaluation (Signed)
Anesthesia Post Note  Patient: Alexandra Foster  Procedure(s) Performed: IRRIGATION AND DEBRIDEMENT LEFT LEG (Left) APPLICATION OF WOUND VAC  Patient location during evaluation: ICU Anesthesia Type: General Level of consciousness: awake Pain management: pain level controlled Vital Signs Assessment: post-procedure vital signs reviewed and stable Respiratory status: spontaneous breathing, respiratory function stable and nonlabored ventilation Cardiovascular status: blood pressure returned to baseline Postop Assessment: no apparent nausea or vomiting Anesthetic complications: no   No notable events documented.   Last Vitals:  Vitals:   05/10/21 1215 05/10/21 1422  BP: (!) 106/55 115/85  Pulse: 93 95  Resp: 17 14  Temp:  37.1 C  SpO2: 98% 99%    Last Pain:  Vitals:   05/10/21 1422  TempSrc: Axillary  PainSc: 10-Worst pain ever                 Darrin Nipper

## 2021-05-10 NOTE — Progress Notes (Signed)
Chaplain arrived in room, husband requested prayer.

## 2021-05-10 NOTE — Anesthesia Preprocedure Evaluation (Addendum)
Anesthesia Evaluation  Patient identified by MRN, date of birth, ID bandGeneral Assessment Comment:Patient drowsy from medication, history obtained from husband and daughter at bedside  Reviewed: Allergy & Precautions, NPO status , Patient's Chart, lab work & pertinent test results  History of Anesthesia Complications Negative for: history of anesthetic complications  Airway Mallampati: Unable to assess       Dental  (+) Teeth Intact   Pulmonary asthma , sleep apnea and Continuous Positive Airway Pressure Ventilation , COPD, former smoker,    Pulmonary exam normal breath sounds clear to auscultation       Cardiovascular hypertension, + dysrhythmias (a fib this admission, now on amiodarone infusion and in SR)  Rhythm:Regular Rate:Normal  ECG 05/06/21:  1. Left ventricular ejection fraction, by estimation, is 50 to 55%. The left ventricle has low normal function. Left ventricular endocardial border not optimally defined to evaluate regional wall motion. There is mild left ventricular hypertrophy. Left ventricular diastolic parameters are indeterminate.  2. Right ventricular systolic function is normal. The right ventricular size is normal. Tricuspid regurgitation signal is inadequate for assessing PA pressure.  3. Left atrial size was mildly dilated.  4. Right atrial size was mildly dilated.  5. The mitral valve is normal in structure. No evidence of mitral valve regurgitation. No evidence of mitral stenosis.  6. The aortic valve is normal in structure. Aortic valve regurgitation is not visualized. Aortic valve sclerosis/calcification is present, without any evidence of aortic stenosis.  7. challenging image quality.   Neuro/Psych negative neurological ROS     GI/Hepatic negative GI ROS,   Endo/Other  diabetes, Type 2Class 3 obesity  Renal/GU Renal disease (AKI on CKD)     Musculoskeletal  (+) Arthritis , Left LE  necrotizing fasciitis s/p fasciotomy and debridement on 05/07/21    Abdominal   Peds  Hematology  (+) Blood dyscrasia, anemia ,   Anesthesia Other Findings From ICU note:  Significant Hospital Events 05/04/21:Admitted to the ICU with severe sepsis with shock secondary to cellulitis of LLE 1/23 off pressors +Afib with RVR 1/24 blood pressure low  1/24 to OR for fasciotomy 1/25 post op resp failure with severe septic shock 1/26 remains on pressors  Reproductive/Obstetrics                            Anesthesia Physical Anesthesia Plan  ASA: 4  Anesthesia Plan: General   Post-op Pain Management:    Induction: Intravenous  PONV Risk Score and Plan: 3 and Ondansetron, Dexamethasone and Treatment may vary due to age or medical condition  Airway Management Planned: Oral ETT  Additional Equipment:   Intra-op Plan:   Post-operative Plan: Extubation in OR  Informed Consent: I have reviewed the patients History and Physical, chart, labs and discussed the procedure including the risks, benefits and alternatives for the proposed anesthesia with the patient or authorized representative who has indicated his/her understanding and acceptance.     Dental advisory given and Consent reviewed with POA  Plan Discussed with: CRNA  Anesthesia Plan Comments: (Patient's husband consented for risks of anesthesia including but not limited to:  - adverse reactions to medications - damage to eyes, teeth, lips or other oral mucosa - nerve damage due to positioning  - sore throat or hoarseness - damage to heart, brain, nerves, lungs, other parts of body or loss of life  Informed patient's husband about role of CRNA in peri- and intra-operative care; he voiced understanding.)  Anesthesia Quick Evaluation

## 2021-05-10 NOTE — Progress Notes (Signed)
Nutrition Follow Up Note   DOCUMENTATION CODES:   Morbid obesity  INTERVENTION:   Once nasogastric tube in place, recommend:  Pivot 1.5_0 /hr- Initiate at 32m/hr and increase by 117mhr q 8 hours until goal rate is reached.   Free water flushes 3083m4 hours to maintain tube patency   Regimen provides 2340kcal/day, 147g/day protein and 1364m12my of free water   Pt at high refeed risk; recommend monitor potassium, magnesium and phosphorus labs daily until stable  Juven Fruit Punch BID via tube, each serving provides 95kcal and 2.5g of protein (amino acids glutamine and arginine)  NUTRITION DIAGNOSIS:   Increased nutrient needs related to wound healing as evidenced by estimated needs.  GOAL:   Patient will meet greater than or equal to 90% of their needs -not met   MONITOR:   PO intake, Supplement acceptance, Labs, Weight trends, Skin, I & O's  ASSESSMENT:   70 y12. female with a history of asthma/COPD, HTN, DMII, obesity, OSA on CPAP, GERD and COVID-19 pneumonia who was admitted 1/22 w/ left LE cellulitis, septic shock, S pyogenes bacteremia, hypotension, new Afib and AKI.  Pt s/p I & D 1/24  Pt with poor appetite and oral intake in hospital. Pt lethargic and did not eat anything or take in any supplements yesterday. Plan for today is for pt to return to the OR for debridement and wound VAC placement. Pt will need adequate nutrition for wound healing. Recommend nutrition support to help pt meet her estimated needs. Pt's nutritional status was discussed in rounds today. Pt would likely benefit from TPN to support wound healing. Pt with bacteremia on admission so unsure if PICC line would be able to be placed. Will plan for NGT and tube feeds for now. Pt is at high refeed risk. Pt may require transfer to tertiary care center.   Medications reviewed and include: vitamin C, colace, heparin, insulin, MVI, protonix, miralax, zinc, levophed, penicillin  Labs reviewed: Na  133(L), K 4.6 wnl, BUN 83(H), creat 3.68(H), P 8.9(H), Mg 3.0(H) Wbc- 23.6(H), Hgb 6.2(L), Hct 17.8(L) Cbgs- 145, 119 x 24hrs  Diet Order:   Diet Order             Diet NPO time specified  Diet effective midnight                  EDUCATION NEEDS:   Education needs have been addressed  Skin:  Skin Assessment: Reviewed RN Assessment (necrotizing fasciitis L leg 30 x 30 cm)  Last BM:  1/27- type 6  Height:   Ht Readings from Last 1 Encounters:  05/04/21 _1  (1.651 m)    Weight:   Wt Readings from Last 1 Encounters:  05/05/21 123.4 kg    Ideal Body Weight:  56.8 kg  BMI:  Body mass index is 45.27 kg/m.  Estimated Nutritional Needs:   Kcal:  2400-2700kcal/day  Protein:  120-140g/day  Fluid:  1.7-2.0L/day  CaseKoleen Distance RD, LDN Please refer to [facility] RD and/or RD on-call/weekend/after hours pager

## 2021-05-10 NOTE — Progress Notes (Signed)
Progress Note  Patient Name: Alexandra Foster Date of Encounter: 05/10/2021  [facility] Cardiologist: Kathlyn Sacramento, MD   Subjective   Seen on the way to the OR for debridement. No chest pain. In SR. Hgb 6.2, Receiving PRBCs.   Inpatient Medications    Scheduled Meds:  amiodarone  150 mg Intravenous Once   vitamin C  500 mg Oral BID   aspirin EC  81 mg Oral Daily   atorvastatin  40 mg Oral Daily   Chlorhexidine Gluconate Cloth  6 each Topical Q0600   docusate  100 mg Per Tube BID   free water  30 mL Per Tube Q4H   heparin injection (subcutaneous)  5,000 Units Subcutaneous Q8H   insulin aspart  0-5 Units Subcutaneous QHS   insulin aspart  0-9 Units Subcutaneous TID WC   midodrine  10 mg Oral TID WC   mometasone-formoterol  2 puff Inhalation BID   montelukast  10 mg Oral QHS   multivitamin with minerals  1 tablet Oral Daily   [START ON 05/11/2021] nutrition supplement (JUVEN)  1 packet Oral BID BM   pantoprazole  40 mg Oral Daily   polyethylene glycol  17 g Per Tube Daily   Ensure Max Protein  11 oz Oral BID   sodium chloride flush  10-40 mL Intracatheter Q12H   sodium hypochlorite  1 application Irrigation Daily   umeclidinium bromide  1 puff Inhalation Daily   zinc sulfate  220 mg Oral Daily   Continuous Infusions:  sodium chloride Stopped (05/05/21 0816)   amiodarone 30 mg/hr (05/10/21 1106)   feeding supplement (PIVOT 1.5 CAL)     linezolid (ZYVOX) IV 600 mg (05/10/21 1127)   norepinephrine (LEVOPHED) Adult infusion 4 mcg/min (05/10/21 1106)   pencillin G potassium IV 3 Million Units (05/10/21 0807)   PRN Meds: docusate sodium, HYDROmorphone (DILAUDID) injection, lip balm, ondansetron (ZOFRAN) IV, oxyCODONE, polyethylene glycol, sodium chloride flush   Vital Signs    Vitals:   05/10/21 1121 05/10/21 1130 05/10/21 1138 05/10/21 1200  BP: (!) 96/57 (!) 100/48 (!) 100/48 135/61  Pulse: 92 94 93 93  Resp: 19 15 18  (!) 21  Temp: 98.6 F (37 C)  98.7 F  (37.1 C)   TempSrc: Axillary  Axillary   SpO2: 99% 98% 99% 98%  Weight:      Height:        Intake/Output Summary (Last 24 hours) at 05/10/2021 1219 Last data filed at 05/10/2021 1138 Gross per 24 hour  Intake 311.68 ml  Output 1200 ml  Net -888.32 ml   Last 3 Weights 05/05/2021 05/04/2021 04/30/2021  Weight (lbs) 272 lb 0.8 oz 266 lb 269 lb  Weight (kg) 123.4 kg 120.657 kg 122.018 kg      Telemetry    SR, HR 90s - Personally Reviewed  ECG    NO new - Personally Reviewed  Physical Exam   GEN: No acute distress.   Neck: No JVD Cardiac: RRR, no murmurs, rubs, or gallops.  Respiratory: Clear to auscultation bilaterally. GI: Soft, nontender, non-distended  MS: No edema; LLE wrapped Neuro:  Nonfocal  Psych: Normal affect   Labs    High Sensitivity Troponin:  No results for input(s): TROPONINIHS in the last 720 hours.   Chemistry Recent Labs  Lab 05/04/21 1943 05/05/21 0454 05/08/21 0450 05/08/21 1313 05/09/21 0545 05/10/21 0450  NA 141   < > 134*  --  131* 133*  K 2.9*   < > 4.1  --  4.3 4.6  CL 99   < > 100  --  98 101  CO2 26   < > 21*  --  21* 19*  GLUCOSE 144*   < > 168*  --  119* 145*  BUN 44*   < > 71*  --  77* 83*  CREATININE 3.47*   < > 3.48*  --  3.88* 3.68*  CALCIUM 8.7*   < > 7.5*  --  7.2* 7.5*  MG  --    < > 2.5*  --  2.7* 3.0*  PROT 7.9  --   --  4.8*  --   --   ALBUMIN 2.8*  --   --  <1.5*  --   --   AST 67*  --   --  136*  --   --   ALT 48*  --   --  47*  --   --   ALKPHOS 103  --   --  135*  --   --   BILITOT 1.1  --   --  3.8*  --   --   GFRNONAA 13*   < > 13*  --  12* 13*  ANIONGAP 16*   < > 13  --  12 13   < > = values in this interval not displayed.    Lipids  Recent Labs  Lab 05/08/21 0450  TRIG 183*    Hematology Recent Labs  Lab 05/08/21 0450 05/09/21 0545 05/10/21 0450  WBC 23.7* 22.2* 23.6*  RBC 3.65* 3.09* 2.37*  HGB 9.5* 8.1* 6.2*  HCT 27.7* 23.0* 17.8*  MCV 75.9* 74.4* 75.1*  MCH 26.0 26.2 26.2  MCHC 34.3 35.2  34.8  RDW 16.1* 16.9* 17.9*  PLT 270 257 317   Thyroid No results for input(s): TSH, FREET4 in the last 168 hours.  BNPNo results for input(s): BNP, PROBNP in the last 168 hours.  DDimer  Recent Labs  Lab 05/05/21 0027  DDIMER 2.22*     Radiology    No results found.  Cardiac Studies     2D Echocardiogram 01.23.2023    1. Left ventricular ejection fraction, by estimation, is 50 to 55%. The  left ventricle has low normal function. Left ventricular endocardial  border not optimally defined to evaluate regional wall motion. There is  mild left ventricular hypertrophy. Left  ventricular diastolic parameters are indeterminate.   2. Right ventricular systolic function is normal. The right ventricular  size is normal. Tricuspid regurgitation signal is inadequate for assessing  PA pressure.   3. Left atrial size was mildly dilated.   4. Right atrial size was mildly dilated.   5. The mitral valve is normal in structure. No evidence of mitral valve  regurgitation. No evidence of mitral stenosis.   6. The aortic valve is normal in structure. Aortic valve regurgitation is  not visualized. Aortic valve sclerosis/calcification is present, without  any evidence of aortic stenosis.   7. challenging image quality.   Patient Profile     71 y.o. female with a history of asthma/COPD, HTN, DMII, obesity, OSA on CPAP, GERD, and COVID-19 pneumonia, who was admitted 1/22 w/ L LE cellulitis, septic shock, S pyogenes bacteremia, hypotension, and AKI.  Developed rapid Afib 1/23  Converted to sinus vs ectopic atrial rhythm early on 1/24 on amio. S/p debridement of LLE.  Assessment & Plan    Afib RVR - in the setting of lower leg cellulitis, septic shock, hypotension, AKI and S pyogenes bacteremia -  noted on 1/23 and converted to NSR 1/24 - IV amiodarone - BP unable to tolerated BB or CCB, on Norepi and midodrine - IV heparin held for anemia - CHADSVAC of 4, she will need longterm  anticoagulation once able - continue IV amio for now, maybe can transition to oral after procedures   Septic shock Hypotension LLD cellulitis - s/p debridement 1/24 - extubated 1/25 - abx and vasopressors - surgery and CCM following - OR today for debridement    AKI/ATN - scr stable - may need IVIG, risks>benefits, hold on this   Anemia - s/p PRBCs - IV heparin held - monitor HGb  For questions or updates, please contact [facility] Please consult [email or web address] for contact info under        Signed, Javian Nudd Ninfa Meeker, PA-C  05/10/2021, 12:19 PM

## 2021-05-10 NOTE — Procedures (Signed)
Intubation Procedure Note  Alexandra Foster  [id number]  Sep 26, 1950  Date:05/10/21  Time:10:04 PM   Provider Performing:Caid Radin L Rust-Chester    Procedure: Intubation (31500)  Indication(s) Respiratory Failure  Consent Risks of the procedure as well as the alternatives and risks of each were explained to the patient and/or caregiver.  Consent for the procedure was obtained and is signed in the bedside chart   Anesthesia Etomidate, Versed, and Fentanyl   Time Out Verified patient identification, verified procedure, site/side was marked, verified correct patient position, special equipment/implants available, medications/allergies/relevant history reviewed, required imaging and test results available.   Sterile Technique Usual hand hygeine, masks, and gloves were used   Procedure Description Patient positioned in bed supine.  Sedation given as noted above.  Patient was intubated with endotracheal tube using Glidescope.  View was Grade 1 full glottis .  Number of attempts was 1.  Colorimetric CO2 detector was consistent with tracheal placement.   Complications/Tolerance None; patient tolerated the procedure well. Chest X-ray is ordered to verify placement.   EBL Minimal   Specimen(s) None  Venetia Night, AGACNP-BC Acute Care Nurse Practitioner [facility]   [phone] / [phone] Please see Amion for pager details.

## 2021-05-11 ENCOUNTER — Inpatient Hospital Stay: Payer: Medicare HMO

## 2021-05-11 DIAGNOSIS — E872 Acidosis, unspecified: Secondary | ICD-10-CM | POA: Diagnosis not present

## 2021-05-11 DIAGNOSIS — A419 Sepsis, unspecified organism: Secondary | ICD-10-CM | POA: Diagnosis not present

## 2021-05-11 DIAGNOSIS — R6521 Severe sepsis with septic shock: Secondary | ICD-10-CM | POA: Diagnosis not present

## 2021-05-11 DIAGNOSIS — R6520 Severe sepsis without septic shock: Secondary | ICD-10-CM | POA: Diagnosis not present

## 2021-05-11 LAB — BASIC METABOLIC PANEL
Anion gap: 26 — ABNORMAL HIGH (ref 5–15)
Anion gap: 16 — ABNORMAL HIGH (ref 5–15)
BUN: 84 mg/dL — ABNORMAL HIGH (ref 8–23)
BUN: 72 mg/dL — ABNORMAL HIGH (ref 8–23)
CO2: 10 mmol/L — ABNORMAL LOW (ref 22–32)
CO2: 25 mmol/L (ref 22–32)
Calcium: 7 mg/dL — ABNORMAL LOW (ref 8.9–10.3)
Calcium: 6.7 mg/dL — ABNORMAL LOW (ref 8.9–10.3)
Chloride: 99 mmol/L (ref 98–111)
Chloride: 95 mmol/L — ABNORMAL LOW (ref 98–111)
Creatinine, Ser: 4.12 mg/dL — ABNORMAL HIGH (ref 0.44–1.00)
Creatinine, Ser: 3.28 mg/dL — ABNORMAL HIGH (ref 0.44–1.00)
GFR, Estimated: 11 mL/min — ABNORMAL LOW (ref >60)
GFR, Estimated: 14 mL/min — ABNORMAL LOW (ref >60)
Glucose, Bld: 343 mg/dL — ABNORMAL HIGH (ref 70–99)
Glucose, Bld: 123 mg/dL — ABNORMAL HIGH (ref 70–99)
Potassium: 6 mmol/L — ABNORMAL HIGH (ref 3.5–5.1)
Potassium: 4.6 mmol/L (ref 3.5–5.1)
Sodium: 135 mmol/L (ref 135–145)
Sodium: 136 mmol/L (ref 135–145)

## 2021-05-11 LAB — BLOOD GAS, VENOUS
Acid-base deficit: 20.6 mmol/L — ABNORMAL HIGH (ref 0.0–2.0)
Acid-base deficit: 12.8 mmol/L — ABNORMAL HIGH (ref 0.0–2.0)
Bicarbonate: 8.9 mmol/L — ABNORMAL LOW (ref 20.0–28.0)
Bicarbonate: 13.2 mmol/L — ABNORMAL LOW (ref 20.0–28.0)
FIO2: 100
MECHVT: 460 mL
Mechanical Rate: 20
O2 Saturation: 91.3 %
O2 Saturation: 98.3 %
PEEP: 5 cmH2O
Patient temperature: 37
Patient temperature: 37
pCO2, Ven: 33 mmHg — ABNORMAL LOW (ref 44.0–60.0)
pCO2, Ven: 30 mmHg — ABNORMAL LOW (ref 44.0–60.0)
pH, Ven: 7.04 — CL (ref 7.250–7.430)
pH, Ven: 7.25 (ref 7.250–7.430)
pO2, Ven: 89 mmHg — ABNORMAL HIGH (ref 32.0–45.0)
pO2, Ven: 126 mmHg — ABNORMAL HIGH (ref 32.0–45.0)

## 2021-05-11 LAB — TYPE AND SCREEN
ABO/RH(D): O POS
Antibody Screen: NEGATIVE
Unit division: 0
Unit division: 0
Unit division: 0
Unit division: 0
Unit division: 0

## 2021-05-11 LAB — CBC WITH DIFFERENTIAL/PLATELET
Abs Immature Granulocytes: 5.79 10*3/uL — ABNORMAL HIGH (ref 0.00–0.07)
Basophils Absolute: 0.1 10*3/uL (ref 0.0–0.1)
Basophils Relative: 0 %
Eosinophils Absolute: 0 10*3/uL (ref 0.0–0.5)
Eosinophils Relative: 0 %
HCT: 33.2 % — ABNORMAL LOW (ref 36.0–46.0)
Hemoglobin: 10.8 g/dL — ABNORMAL LOW (ref 12.0–15.0)
Immature Granulocytes: 14 %
Lymphocytes Relative: 4 %
Lymphs Abs: 1.6 10*3/uL (ref 0.7–4.0)
MCH: 28.7 pg (ref 26.0–34.0)
MCHC: 32.5 g/dL (ref 30.0–36.0)
MCV: 88.3 fL (ref 80.0–100.0)
Monocytes Absolute: 0.9 10*3/uL (ref 0.1–1.0)
Monocytes Relative: 2 %
Neutro Abs: 34.4 10*3/uL — ABNORMAL HIGH (ref 1.7–7.7)
Neutrophils Relative %: 80 %
Platelets: 219 10*3/uL (ref 150–400)
RBC: 3.76 MIL/uL — ABNORMAL LOW (ref 3.87–5.11)
RDW: 18.6 % — ABNORMAL HIGH (ref 11.5–15.5)
Smear Review: NORMAL
WBC: 42.8 10*3/uL — ABNORMAL HIGH (ref 4.0–10.5)
nRBC: 5.4 % — ABNORMAL HIGH (ref 0.0–0.2)

## 2021-05-11 LAB — RENAL FUNCTION PANEL
Albumin: <1.5 g/dL — ABNORMAL LOW (ref 3.5–5.0)
Anion gap: 17 — ABNORMAL HIGH (ref 5–15)
BUN: 89 mg/dL — ABNORMAL HIGH (ref 8–23)
CO2: 22 mmol/L (ref 22–32)
Calcium: 5.5 mg/dL — CL (ref 8.9–10.3)
Chloride: 96 mmol/L — ABNORMAL LOW (ref 98–111)
Creatinine, Ser: 4 mg/dL — ABNORMAL HIGH (ref 0.44–1.00)
GFR, Estimated: 11 mL/min — ABNORMAL LOW (ref >60)
Glucose, Bld: 204 mg/dL — ABNORMAL HIGH (ref 70–99)
Phosphorus: 10.8 mg/dL — ABNORMAL HIGH (ref 2.5–4.6)
Potassium: 4.5 mmol/L (ref 3.5–5.1)
Sodium: 135 mmol/L (ref 135–145)

## 2021-05-11 LAB — BPAM RBC
Blood Product Expiration Date: 202303012359
Blood Product Expiration Date: 202303082359
Blood Product Expiration Date: 202303082359
Blood Product Expiration Date: 202303082359
Blood Product Expiration Date: 202303082359
ISSUE DATE / TIME: 202301271114
ISSUE DATE / TIME: 202301272142
ISSUE DATE / TIME: 202301272202
ISSUE DATE / TIME: 202301272202
ISSUE DATE / TIME: 202301272235
Unit Type and Rh: 5100
Unit Type and Rh: 5100
Unit Type and Rh: 5100
Unit Type and Rh: 5100
Unit Type and Rh: 5100

## 2021-05-11 LAB — COMPREHENSIVE METABOLIC PANEL
ALT: 1746 U/L — ABNORMAL HIGH (ref 0–44)
AST: 4359 U/L — ABNORMAL HIGH (ref 15–41)
Albumin: <1.5 g/dL — ABNORMAL LOW (ref 3.5–5.0)
Alkaline Phosphatase: 185 U/L — ABNORMAL HIGH (ref 38–126)
Anion gap: 24 — ABNORMAL HIGH (ref 5–15)
BUN: 92 mg/dL — ABNORMAL HIGH (ref 8–23)
CO2: 15 mmol/L — ABNORMAL LOW (ref 22–32)
Calcium: 6.8 mg/dL — ABNORMAL LOW (ref 8.9–10.3)
Chloride: 98 mmol/L (ref 98–111)
Creatinine, Ser: 4.21 mg/dL — ABNORMAL HIGH (ref 0.44–1.00)
GFR, Estimated: 11 mL/min — ABNORMAL LOW (ref >60)
Glucose, Bld: 368 mg/dL — ABNORMAL HIGH (ref 70–99)
Potassium: 5.4 mmol/L — ABNORMAL HIGH (ref 3.5–5.1)
Sodium: 137 mmol/L (ref 135–145)
Total Bilirubin: 2.5 mg/dL — ABNORMAL HIGH (ref 0.3–1.2)
Total Protein: 4.6 g/dL — ABNORMAL LOW (ref 6.5–8.1)

## 2021-05-11 LAB — CBC
HCT: 32.2 % — ABNORMAL LOW (ref 36.0–46.0)
Hemoglobin: 9.7 g/dL — ABNORMAL LOW (ref 12.0–15.0)
MCH: 27.6 pg (ref 26.0–34.0)
MCHC: 30.1 g/dL (ref 30.0–36.0)
MCV: 91.5 fL (ref 80.0–100.0)
Platelets: 208 10*3/uL (ref 150–400)
RBC: 3.52 MIL/uL — ABNORMAL LOW (ref 3.87–5.11)
RDW: 18.5 % — ABNORMAL HIGH (ref 11.5–15.5)
WBC: 42.7 10*3/uL — ABNORMAL HIGH (ref 4.0–10.5)
nRBC: 2.6 % — ABNORMAL HIGH (ref 0.0–0.2)

## 2021-05-11 LAB — GLUCOSE, CAPILLARY
Glucose-Capillary: 114 mg/dL — ABNORMAL HIGH (ref 70–99)
Glucose-Capillary: 119 mg/dL — ABNORMAL HIGH (ref 70–99)
Glucose-Capillary: 124 mg/dL — ABNORMAL HIGH (ref 70–99)
Glucose-Capillary: 142 mg/dL — ABNORMAL HIGH (ref 70–99)
Glucose-Capillary: 165 mg/dL — ABNORMAL HIGH (ref 70–99)
Glucose-Capillary: 191 mg/dL — ABNORMAL HIGH (ref 70–99)
Glucose-Capillary: 199 mg/dL — ABNORMAL HIGH (ref 70–99)
Glucose-Capillary: 205 mg/dL — ABNORMAL HIGH (ref 70–99)
Glucose-Capillary: 231 mg/dL — ABNORMAL HIGH (ref 70–99)
Glucose-Capillary: 239 mg/dL — ABNORMAL HIGH (ref 70–99)
Glucose-Capillary: 264 mg/dL — ABNORMAL HIGH (ref 70–99)
Glucose-Capillary: 270 mg/dL — ABNORMAL HIGH (ref 70–99)
Glucose-Capillary: 285 mg/dL — ABNORMAL HIGH (ref 70–99)
Glucose-Capillary: 299 mg/dL — ABNORMAL HIGH (ref 70–99)
Glucose-Capillary: 299 mg/dL — ABNORMAL HIGH (ref 70–99)
Glucose-Capillary: 301 mg/dL — ABNORMAL HIGH (ref 70–99)
Glucose-Capillary: 304 mg/dL — ABNORMAL HIGH (ref 70–99)

## 2021-05-11 LAB — HEMOGLOBIN AND HEMATOCRIT, BLOOD
HCT: 29.8 % — ABNORMAL LOW (ref 36.0–46.0)
HCT: 30.1 % — ABNORMAL LOW (ref 36.0–46.0)
Hemoglobin: 9.9 g/dL — ABNORMAL LOW (ref 12.0–15.0)
Hemoglobin: 9.9 g/dL — ABNORMAL LOW (ref 12.0–15.0)

## 2021-05-11 LAB — CORTISOL: Cortisol, Plasma: 26.7 ug/dL

## 2021-05-11 LAB — PHOSPHORUS
Phosphorus: 14.2 mg/dL — ABNORMAL HIGH (ref 2.5–4.6)
Phosphorus: 8.4 mg/dL — ABNORMAL HIGH (ref 2.5–4.6)

## 2021-05-11 LAB — TRIGLYCERIDES: Triglycerides: 145 mg/dL (ref <150)

## 2021-05-11 LAB — MAGNESIUM
Magnesium: 2.9 mg/dL — ABNORMAL HIGH (ref 1.7–2.4)
Magnesium: 2.2 mg/dL (ref 1.7–2.4)

## 2021-05-11 MED ORDER — LEVETIRACETAM IN NACL 1000 MG/100ML IV SOLN
1000.0000 mg | Freq: Two times a day (BID) | INTRAVENOUS | Status: DC
  Administered 2021-05-12 – 2021-05-14 (×5): 1000 mg via INTRAVENOUS
  Filled 2021-05-11 (×7): qty 100

## 2021-05-11 MED ORDER — INSULIN REGULAR(HUMAN) IN NACL 100-0.9 UT/100ML-% IV SOLN
INTRAVENOUS | Status: DC
  Administered 2021-05-11: 27 [IU]/h via INTRAVENOUS
  Administered 2021-05-11: 14 [IU]/h via INTRAVENOUS
  Filled 2021-05-11 (×2): qty 100

## 2021-05-11 MED ORDER — HYDROCORTISONE SOD SUC (PF) 100 MG IJ SOLR
100.0000 mg | Freq: Three times a day (TID) | INTRAMUSCULAR | Status: DC
  Administered 2021-05-11 – 2021-05-15 (×13): 100 mg via INTRAVENOUS
  Filled 2021-05-11 (×13): qty 2

## 2021-05-11 MED ORDER — ZINC SULFATE 220 (50 ZN) MG PO CAPS
220.0000 mg | ORAL_CAPSULE | Freq: Every day | ORAL | Status: AC
  Administered 2021-05-12 – 2021-05-21 (×9): 220 mg
  Filled 2021-05-11 (×10): qty 1

## 2021-05-11 MED ORDER — INSULIN ASPART 100 UNIT/ML IJ SOLN: 0.0000 [IU] | INTRAMUSCULAR | Status: DC

## 2021-05-11 MED ORDER — SODIUM CHLORIDE 0.9 % FOR CRRT
INTRAVENOUS_CENTRAL | Status: DC | PRN
  Filled 2021-05-11 (×2): qty 1000

## 2021-05-11 MED ORDER — PRISMASOL BGK 0/2.5 32-2.5 MEQ/L EC SOLN
Status: DC
  Filled 2021-05-11: qty 5000

## 2021-05-11 MED ORDER — SODIUM BICARBONATE 8.4 % IV SOLN
100.0000 meq | Freq: Once | INTRAVENOUS | Status: AC
  Administered 2021-05-11: 100 meq via INTRAVENOUS
  Filled 2021-05-11: qty 50

## 2021-05-11 MED ORDER — MIDAZOLAM HCL 2 MG/2ML IJ SOLN
2.0000 mg | INTRAMUSCULAR | Status: DC | PRN
  Administered 2021-05-24 – 2021-05-25 (×2): 2 mg via INTRAVENOUS
  Filled 2021-05-11 (×3): qty 2

## 2021-05-11 MED ORDER — ORAL CARE MOUTH RINSE
15.0000 mL | OROMUCOSAL | Status: DC
  Administered 2021-05-11 – 2021-06-13 (×161): 15 mL via OROMUCOSAL

## 2021-05-11 MED ORDER — BUDESONIDE 0.25 MG/2ML IN SUSP
0.2500 mg | Freq: Two times a day (BID) | RESPIRATORY_TRACT | Status: DC
  Administered 2021-05-11 – 2021-05-23 (×25): 0.25 mg via RESPIRATORY_TRACT
  Filled 2021-05-11 (×25): qty 2

## 2021-05-11 MED ORDER — PRISMASOL BGK 0/2.5 32-2.5 MEQ/L EC SOLN
Status: DC
  Filled 2021-05-11 (×38): qty 5000

## 2021-05-11 MED ORDER — PANTOPRAZOLE SODIUM 40 MG IV SOLR
40.0000 mg | INTRAVENOUS | Status: DC
  Administered 2021-05-11 – 2021-06-05 (×26): 40 mg via INTRAVENOUS
  Filled 2021-05-11: qty 40
  Filled 2021-05-11: qty 10
  Filled 2021-05-11: qty 40
  Filled 2021-05-11 (×2): qty 10
  Filled 2021-05-11: qty 40
  Filled 2021-05-11 (×2): qty 10
  Filled 2021-05-11: qty 40
  Filled 2021-05-11 (×2): qty 10
  Filled 2021-05-11: qty 40
  Filled 2021-05-11 (×2): qty 10
  Filled 2021-05-11: qty 40
  Filled 2021-05-11: qty 10
  Filled 2021-05-11: qty 40
  Filled 2021-05-11: qty 10
  Filled 2021-05-11 (×3): qty 40
  Filled 2021-05-11: qty 10
  Filled 2021-05-11: qty 40
  Filled 2021-05-11 (×3): qty 10

## 2021-05-11 MED ORDER — INSULIN ASPART 100 UNIT/ML IV SOLN
10.0000 [IU] | Freq: Once | INTRAVENOUS | Status: AC
  Administered 2021-05-11: 10 [IU] via INTRAVENOUS
  Filled 2021-05-11: qty 0.1

## 2021-05-11 MED ORDER — INSULIN ASPART 100 UNIT/ML IJ SOLN
1.0000 [IU] | INTRAMUSCULAR | Status: DC
  Administered 2021-05-11: 1 [IU] via SUBCUTANEOUS
  Administered 2021-05-12 (×3): 2 [IU] via SUBCUTANEOUS
  Administered 2021-05-12: 3 [IU] via SUBCUTANEOUS
  Administered 2021-05-12: 2 [IU] via SUBCUTANEOUS
  Administered 2021-05-12: 3 [IU] via SUBCUTANEOUS
  Administered 2021-05-13: 2 [IU] via SUBCUTANEOUS
  Administered 2021-05-13: 1 [IU] via SUBCUTANEOUS
  Administered 2021-05-13: 2 [IU] via SUBCUTANEOUS
  Administered 2021-05-13: 1 [IU] via SUBCUTANEOUS
  Administered 2021-05-13 – 2021-05-14 (×3): 2 [IU] via SUBCUTANEOUS
  Administered 2021-05-14 (×2): 3 [IU] via SUBCUTANEOUS
  Administered 2021-05-14: 2 [IU] via SUBCUTANEOUS
  Administered 2021-05-14 – 2021-05-15 (×5): 3 [IU] via SUBCUTANEOUS
  Administered 2021-05-15 (×3): 2 [IU] via SUBCUTANEOUS
  Administered 2021-05-16 (×3): 1 [IU] via SUBCUTANEOUS
  Administered 2021-05-17: 2 [IU] via SUBCUTANEOUS
  Administered 2021-05-17 (×2): 1 [IU] via SUBCUTANEOUS
  Administered 2021-05-18 (×2): 2 [IU] via SUBCUTANEOUS
  Filled 2021-05-11 (×35): qty 1

## 2021-05-11 MED ORDER — SODIUM BICARBONATE 8.4 % IV SOLN
INTRAVENOUS | Status: AC
  Filled 2021-05-11: qty 50

## 2021-05-11 MED ORDER — IMMUNE GLOBULIN (HUMAN) 10 GM/100ML IV SOLN
500.0000 mg/kg | INTRAVENOUS | Status: AC
  Administered 2021-05-12 – 2021-05-13 (×2): 30 g via INTRAVENOUS
  Filled 2021-05-11 (×2): qty 300

## 2021-05-11 MED ORDER — INSULIN ASPART 100 UNIT/ML IJ SOLN: 1.0000 [IU] | INTRAMUSCULAR | Status: DC

## 2021-05-11 MED ORDER — INSULIN ASPART 100 UNIT/ML IJ SOLN
INTRAMUSCULAR | Status: AC
  Filled 2021-05-11: qty 1

## 2021-05-11 MED ORDER — OXYCODONE HCL 5 MG PO TABS
10.0000 mg | ORAL_TABLET | Freq: Four times a day (QID) | ORAL | Status: DC | PRN
  Filled 2021-05-11: qty 2

## 2021-05-11 MED ORDER — HEPARIN SODIUM (PORCINE) 1000 UNIT/ML DIALYSIS
1000.0000 [IU] | INTRAMUSCULAR | Status: DC | PRN
  Administered 2021-05-11 (×2): 1800 [IU] via INTRAVENOUS_CENTRAL
  Administered 2021-05-14: 2600 [IU] via INTRAVENOUS_CENTRAL
  Filled 2021-05-11 (×2): qty 6
  Filled 2021-05-11: qty 3
  Filled 2021-05-11: qty 5
  Filled 2021-05-11: qty 6

## 2021-05-11 MED ORDER — CHLORHEXIDINE GLUCONATE 0.12% ORAL RINSE (MEDLINE KIT)
15.0000 mL | Freq: Two times a day (BID) | OROMUCOSAL | Status: DC
  Administered 2021-05-11 – 2021-06-13 (×30): 15 mL via OROMUCOSAL

## 2021-05-11 MED ORDER — CALCIUM GLUCONATE-NACL 2-0.675 GM/100ML-% IV SOLN
2.0000 g | Freq: Once | INTRAVENOUS | Status: AC
  Administered 2021-05-11: 2000 mg via INTRAVENOUS
  Filled 2021-05-11: qty 100

## 2021-05-11 MED ORDER — INSULIN DETEMIR 100 UNIT/ML ~~LOC~~ SOLN
38.0000 [IU] | Freq: Two times a day (BID) | SUBCUTANEOUS | Status: DC
  Administered 2021-05-12 – 2021-05-18 (×12): 38 [IU] via SUBCUTANEOUS
  Filled 2021-05-11 (×14): qty 0.38

## 2021-05-11 MED ORDER — DEXTROSE 50 % IV SOLN: 0.0000 mL | INTRAVENOUS | Status: DC | PRN

## 2021-05-11 MED ORDER — IMMUNE GLOBULIN (HUMAN) 10 GM/100ML IV SOLN
1.0000 g/kg | Freq: Once | INTRAVENOUS | Status: AC
Start: 2021-05-11 — End: 2021-05-11
  Administered 2021-05-11: 55 g via INTRAVENOUS
  Filled 2021-05-11: qty 550

## 2021-05-11 MED ORDER — INSULIN DETEMIR 100 UNIT/ML ~~LOC~~ SOLN
38.0000 [IU] | Freq: Two times a day (BID) | SUBCUTANEOUS | Status: DC
  Administered 2021-05-11: 38 [IU] via SUBCUTANEOUS
  Filled 2021-05-11 (×2): qty 0.38

## 2021-05-11 MED ORDER — JUVEN PO PACK
1.0000 | PACK | Freq: Two times a day (BID) | ORAL | Status: DC
  Administered 2021-05-13: 1

## 2021-05-11 MED ORDER — MIDAZOLAM HCL 2 MG/2ML IJ SOLN: 2.0000 mg | Freq: Once | INTRAMUSCULAR | Status: DC

## 2021-05-11 MED ORDER — DEXTROSE 50 % IV SOLN
1.0000 | Freq: Once | INTRAVENOUS | Status: AC
  Administered 2021-05-11: 50 mL via INTRAVENOUS
  Filled 2021-05-11: qty 50

## 2021-05-11 MED ORDER — ALBUMIN HUMAN 25 % IV SOLN
25.0000 g | Freq: Four times a day (QID) | INTRAVENOUS | Status: AC
  Administered 2021-05-11 – 2021-05-12 (×6): 25 g via INTRAVENOUS
  Filled 2021-05-11 (×6): qty 100

## 2021-05-11 MED ORDER — INSULIN ASPART 100 UNIT/ML IJ SOLN
0.0000 [IU] | INTRAMUSCULAR | Status: DC
  Administered 2021-05-11: 11 [IU] via SUBCUTANEOUS
  Filled 2021-05-11: qty 1

## 2021-05-11 MED ORDER — ADULT MULTIVITAMIN W/MINERALS CH
1.0000 | ORAL_TABLET | Freq: Every day | ORAL | Status: DC
  Administered 2021-05-12 – 2021-05-14 (×3): 1
  Filled 2021-05-11 (×4): qty 1

## 2021-05-11 MED ORDER — LEVETIRACETAM IN NACL 1000 MG/100ML IV SOLN
1000.0000 mg | Freq: Once | INTRAVENOUS | Status: AC
  Administered 2021-05-11: 1000 mg via INTRAVENOUS
  Filled 2021-05-11: qty 100

## 2021-05-11 NOTE — Procedures (Signed)
°  OPERATIVE NOTE   PROCEDURE: Ultrasound guidance for vascular access LEFT IJ vein Placement of Foster 20cm dialysis catheter LEFT IJ vein  PRE-OPERATIVE DIAGNOSIS: 1. Acute renal failure 2. Severe, sepsis  POST-OPERATIVE DIAGNOSIS: Same  SURGEON: Jamesetta So, MD  ASSISTANT(S): None  ANESTHESIA: local  ESTIMATED BLOOD LOSS: Minimal   FINDING(S): 1.  None  SPECIMEN(S):  None  INDICATIONS:    Patient is Foster 71 y.o.female who presents with Severe sepsis.  Risks and benefits were discussed, and informed consent was obtained..  DESCRIPTION: After obtaining full informed written consent, the patient was laid flat in the bed.  The Left Neck was sterilely prepped and draped in Foster sterile surgical field was created. The Left Internal Jugular vein was visualized with ultrasound and found to be widely patent. It was then accessed under direct guidance without difficulty with Foster Seldinger needle and Foster permanent image was recorded. Foster J-wire was then placed. After skin nick and dilatation, Foster 20cm dialysis catheter was placed over the wire and the wire was removed. The lumens withdrew dark red nonpulsatile blood and flushed easily with sterile saline. The catheter was secured to the skin with 3 nylon sutures. Sterile dressing was placed.  COMPLICATIONS: None  CONDITION: Stable  Alexandra Foster 05/11/2021 12:57 PM  This note was created with Dragon Medical transcription system. Any errors in dictation are purely unintentional.

## 2021-05-11 NOTE — Progress Notes (Signed)
Concerns for subclinical seizure post suction by respiratory. Involuntary jerking. Asked NP to exam at bedside

## 2021-05-11 NOTE — Progress Notes (Signed)
Acute Hypoxic Respiratory Failure in the setting of worsening septic shock PMHx: COPD Patient re-intubated due to increased lethargy and work of breathing - Ventilator settings: PRVC  8 mL/kg, 100% FiO2, 5 PEEP, continue ventilator support & lung protective strategies - Wean PEEP & FiO2 as tolerated, maintain SpO2 > 90% - Head of bed elevated 30 degrees, VAP protocol in place - Plateau pressures less than 30 cm H20  - Intermittent chest x-ray & ABG PRN - Daily WUA with SBT as tolerated  - Ensure adequate pulmonary hygiene  - Budesonide nebs BID, bronchodilators PRN - PAD protocol in place: continue Dilaudid drip  Severe Metabolic Acidosis Acute Kidney Injury  VBG (as unable to obtain ABG): < 6.9/ 38/69/7.3 Baseline Cr: 0.98, Cr on admission: 3.47 > 4.32 - sodium bicarbonate amps ordered & drip initiated - trend VBG Q 4 - Strict I/O's: alert provider if UOP < 0.5 mL/kg/hr - gentle IVF hydration  - Daily BMP, replace electrolytes PRN - Avoid nephrotoxic agents as able, ensure adequate renal perfusion - Nephrology consulted, appreciate input   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

## 2021-05-11 NOTE — Progress Notes (Signed)
Patient has had an eventful day.  At the beginning of the shift negative pressure dressing was noted to be leaking and negative pressure machine continued to alarm, negative pressure dressing assessed and area with leak reinforced, despite this machine continued to alarm.  Company was reached out too with no resolution, Dr. Peyton Najjar notified of status.  Dr. Peyton Najjar came to bedside and reinforced the negative pressure dressing and stopped the 66mL irrigation.  Negative pressure dressing now functioning properly.  Patient had two blood sugars greater than 250, Dr. Patsey Berthold notified and Insulin gtt ordered.  Patient was later seen by Dr. Theador Hawthorne and CRRT ordered, consents obtained for CRRT and Dialysis catheter placement.  Dialysis catheter placed by Dr. Lorenso Courier once time out was done, patient tolerated well and placement verified via CXR.  Patient continues with fluctuating blood pressures and bed position remain semi trendelenburg to flat position, Dr. Patsey Berthold updated and ok to hold anything that is via OG tube.  Later in the shift, patients blood sugars returned to goal range, Dr. Patsey Berthold notified and ordered for insulin gtt to be discontiued two hours after Levemir administration and sliding scale ordered as well.  Lab called with critical value Calcium of 5.5; other labs noted to be phosphorus 10.8 and albumin <1.5; Dr. Theador Hawthorne notified and ordered to continue with CCRT.  Will endorse to oncoming shift.

## 2021-05-11 NOTE — Progress Notes (Signed)
CRRT restarted with no complications.

## 2021-05-11 NOTE — Progress Notes (Addendum)
NAME:  Alexandra Foster, MRN:  [id number], DOB:  April 02, 1951, LOS: 7 ADMISSION DATE:  05/04/2021   BRIEF SYNOPSIS 71 y.o female with medical history significant of Asthma/COPD, T2DM, HTN, OSA on CPAP, GERD, COVID-19 pneumonia, and Bilateral lower extremity edema who presented to the ED with chief complaints of LLE pain and chills since Thursday.   Patient given 30 cc/kg of fluids and started on broad-spectrum antibiotics for sepsis with septic shock. Patient remained hypotensive despite IVF boluses therefore was started on Levophed. PCCM consulted.  NOW WITH DX OF SEVERE SEPTIC SHOCK FROM NECROTIZING FASCIITIS  Past Medical History     Arthritis   Asthma   COPD (chronic obstructive pulmonary disease) ([place])   Diabetes mellitus without complication ([place])   Endometriosis   GERD (gastroesophageal reflux disease)   Hypertension   Obesity   Sleep apnea    CPAP    Significant Hospital Events   05/04/21: Admitted to the ICU with severe sepsis with shock secondary to cellulitis of LLE 1/23 off pressors +Afib with RVR 1/24 blood pressure low  1/24 to OR for fasciotomy 1/25 post op resp failure with severe septic shock 1/26 remains on pressors 1/27 returns to the OR today for debridement, wound VAC 1/28 overnight with hemodynamic deterioration, required reintubation, currently on pressors   Significant Diagnostic Tests:  1/21: Chest Xray> no active cardiopulmonary process 1/21: CT left lower leg>Diffuse subcutaneous edema may represent cellulitis. No drainable fluid collection/abscess 1/21: Ultrasound lower unilateral left> no DVT   Micro Data:  1/21: SARS-CoV-2 PCR> negative 1/21: Influenza PCR> negative 1/21: Blood culture x2> Streptococcus pyogenes 1/21: Urine Culture> negative 1/21: MRSA PCR>> negative   Antimicrobials:  Vancomycin 1/21> off Cefepime 1/21> off Ceftriaxone 1/21 >> off Penicilli G 1/24>> Linezolid 1/25>>   *Antimicrobials per ID Interim History /  Subjective:  Patient with acute necrotizing fasciitis with emergent fasciotomy Critically ill remains on pressors Prognosis is poor Returned to the OR 1/27 progressive hemodynamic instability after OR  Objective   Blood pressure (!) 141/47, pulse 94, temperature 99.9 F (37.7 C), temperature source Esophageal, resp. rate 20, height 5\' 5"  (1.651 m), weight 128.3 kg, SpO2 93 %.    Vent Mode: PRVC FiO2 (%):  [70 %-100 %] 70 % Set Rate:  [20 bmp] 20 bmp Vt Set:  [460 mL] 460 mL PEEP:  [5 cmH20] 5 cmH20 Plateau Pressure:  [14 cmH20-18 cmH20] 14 cmH20   Intake/Output Summary (Last 24 hours) at 05/11/2021 1018 Last data filed at 05/11/2021 2025 Gross per 24 hour  Intake 9710.48 ml  Output 765 ml  Net 8945.48 ml    Filed Weights   05/04/21 1941 05/05/21 1100 05/11/21 0500  Weight: 120.7 kg 123.4 kg 128.3 kg   Unable to obtain ROS due to the patient being intubated  PHYSICAL EXAMINATION:  GENERAL: Obese, particularly ill-appearing woman, sedated on the ventilator, occasionally will open eyes to no specific stimulus, query myoclonic activity at the jaw.  Unresponsive. HEAD: Normocephalic, atraumatic.  EYES: Pupils equal, sluggishly reactive to light.  No scleral icterus.  Mild scleral edema. MOUTH: Orotracheally intubated, mucosa moist. NECK: Supple. No thyromegaly. Trachea midline. No JVD.  No adenopathy.  No crepitus. PULMONARY: Good air entry bilaterally.  No adventitious sounds. CARDIOVASCULAR: S1 and S2.  Regular rate and rhythm.  ABDOMEN: Obese, nondistended, soft, normoactive bowel sounds. MUSCULOSKELETAL: Left lower extremity with wound VAC in place, surgery redressing.  Status post fasciotomy NEUROLOGIC: Unresponsive, some quivering/myoclonus of the jaw.  Eye opening without significant stimulus,  intermittent. SKIN: Intact,warm,dry.  Assessment & Plan  Acute Hypoxic Respiratory Failure in the setting of worsening septic shock PMHx: COPD Patient re-intubated due to  increased lethargy and work of breathing - Ventilator settings: PRVC  8 mL/kg, 100% FiO2, 5 PEEP, continue ventilator support & lung protective strategies - Wean PEEP & FiO2 as tolerated, maintain SpO2 > 90% - Head of bed elevated 30 degrees, VAP protocol in place - Plateau pressures less than 30 cm H20  - Intermittent chest x-ray & ABG PRN - Daily WUA with SBT as tolerated  - Ensure adequate pulmonary hygiene  - Budesonide nebs BID, bronchodilators PRN - PAD protocol in place: continue Dilaudid drip (avoiding fentanyl due to linezolid administration)   Severe Metabolic Acidosis Acute Kidney Injury  VBG (as unable to obtain ABG): < 6.9/ 38/69 >> 7.04/33/89 Baseline Cr: 0.98, Cr on admission: 3.47 > 4.32>> 4.12 - sodium bicarbonate infusion - trend VBG Q 4 - Strict I/O's: alert provider if UOP < 0.5 mL/kg/hr - Continue volume resuscitation as tolerates - Daily BMP, replace electrolytes PRN - Avoid nephrotoxic agents as able, ensure adequate renal perfusion - Suspect will need CRRT - Nephrology consulted, appreciate input    Worsening Septic Shock secondary to LLE necrotizing fasciitis Streptococcal toxic shock syndrome Hypovolemic shock aggravating the above - Currently on dopamine & epinephrine drips as well as levophed & vasopressin - Weaning off drips as tolerated - Consider IVIG administration - patient volume resuscitated and received transfusion - attempted arterial line multiple times, unsuccessful due to poor caliber of vessels - f/u cortisol level >> 26.7  - SDS ordered  Group A strep necrotizing fasciitis L LE Streptococcal toxic shock - Continue antibiotics: Penicillin G/linezolid - Status postdebridement, wound VAC per surgery - Poor prognosis   Anemia: Acute Blood Loss in the setting of I&D intervention and LLE wound vac placement Hgb: 4.9>>9.7>>10.8 - Transfused 4 units of RBC's last night - Monitor for s/s of bleeding, there is oozing from wound VAC and left  lower extremity - Daily CBC - Transfuse for Hgb <7  Diabetes mellitus with poor control Physiologic stress driving poor control -Transient hypoglycemia, corrected - Now hyperglycemic persistently despite SSI - ICU insulin infusion protocol  Transaminitis D/T shock liver - Supportive care - Monitor hepatic function  Acute encephalopathy DDx: Acute metabolic, query HIE given shock/massive resuscitation last PM -Toxic metabolic encephalopathy - Concern for toxemic/ischemic encephalopathy due to prolonged resuscitation - Continue supportive care - May need MRI brain/EEG when more stable    Best practice:  Diet: NPO Pain/Anxiety/Delirium protocol (if indicated): Yes, initiated VAP protocol (if indicated): Yes, initiated DVT prophylaxis: Subcutaneous Heparin GI prophylaxis: PPI Glucose control: ICU hyperglycemia protocol, insulin drip Central venous access:  N/A Arterial line:  N/A Foley:  N/A Mobility:  bed rest  PT consulted: N/A Last date of multidisciplinary goals of care discussion [1/27] Code Status:  full code Disposition: ICU  Discussed with patient's husband, daughter and sister on 1/27.  Very poor prognosis.  They are aware of the patient's very tenuous status.  Need for multiple pressors due to streptococcal toxic shock syndrome.  They desire full code still present.  Labs   CBC: Recent Labs  Lab 05/04/21 1943 05/05/21 0454 05/09/21 0545 05/10/21 0450 05/10/21 2040 05/11/21 0040 05/11/21 0531  WBC 16.5*   < > 22.2* 23.6* 40.9* 42.7* 42.8*  NEUTROABS 15.5*  --   --   --   --   --  34.4*  HGB 12.5   < >  8.1* 6.2* 4.9* 9.7* 10.8*  HCT 40.8   < > 23.0* 17.8* 16.3* 32.2* 33.2*  MCV 78.5*   < > 74.4* 75.1* 86.7 91.5 88.3  PLT 282   < > 257 317 267 208 219   < > = values in this interval not displayed.     Basic Metabolic Panel: Recent Labs  Lab 05/08/21 0450 05/09/21 0545 05/10/21 0450 05/10/21 2040 05/11/21 0040 05/11/21 0531  NA 134* 131* 133*  138 135 137  K 4.1 4.3 4.6 >7.5* 6.0* 5.4*  CL 100 98 101 106 99 98  CO2 21* 21* 19* <7* 10* 15*  GLUCOSE 168* 119* 145* 24* 343* 368*  BUN 71* 77* 83* 86* 84* 92*  CREATININE 3.48* 3.88* 3.68* 4.32* 4.12* 4.21*  CALCIUM 7.5* 7.2* 7.5* 7.5* 7.0* 6.8*  MG 2.5* 2.7* 3.0* 3.6*  --  2.9*  PHOS 7.7* 7.8* 8.9* 17.5*  --  14.2*    GFR: Estimated Creatinine Clearance: 16.3 mL/min (A) (by C-G formula based on SCr of 4.21 mg/dL (H)). Recent Labs  Lab 05/04/21 2130 05/04/21 2150 05/05/21 0454 05/05/21 1153 05/05/21 1737 05/05/21 2017 05/06/21 0325 05/07/21 0821 05/07/21 1046 05/08/21 0450 05/10/21 0450 05/10/21 2040 05/11/21 0040 05/11/21 0531  PROCALCITON 35.76  --  40.92  --   --   --  66.16  --   --   --   --   --   --   --   WBC  --   --  17.6*  --   --   --  16.2* 21.9*  --    < > 23.6* 40.9* 42.7* 42.8*  LATICACIDVEN  --    < >  --    < > 4.1* 2.9*  --  1.7 1.7  --   --   --   --   --    < > = values in this interval not displayed.     Liver Function Tests: Recent Labs  Lab 05/04/21 1943 05/08/21 1313 05/10/21 0450 05/11/21 0531  AST 67* 136* 97* 4,359*  ALT 48* 47* 36 1,746*  ALKPHOS 103 135* 145* 185*  BILITOT 1.1 3.8* 1.8* 2.5*  PROT 7.9 4.8* 5.0* 4.6*  ALBUMIN 2.8* <1.5* <1.5* <1.5*    Coagulation Profile: Recent Labs  Lab 05/05/21 0027  INR 1.3*     Cardiac Enzymes: Recent Labs  Lab 05/08/21 1313  CKTOTAL 1,575*     HbA1C: Hgb A1c MFr Bld  Date/Time Value Ref Range Status  05/05/2021 04:53 AM 6.7 (H) 4.8 - 5.6 % Final    Comment:    (NOTE)         Prediabetes: 5.7 - 6.4         Diabetes: >6.4         Glycemic control for adults with diabetes: <7.0   02/11/2021 09:41 AM 6.3 (H) <5.7 % of total Hgb Final    Comment:    For someone without known diabetes, a hemoglobin  A1c value between 5.7% and 6.4% is consistent with prediabetes and should be confirmed with a  follow-up test. . For someone with known diabetes, a value <7% indicates  that their diabetes is well controlled. A1c targets should be individualized based on duration of diabetes, age, comorbid conditions, and other considerations. . This assay result is consistent with an increased risk of diabetes. . Currently, no consensus exists regarding use of hemoglobin A1c for diagnosis of diabetes for children. .     CBG: Recent  Labs  Lab 05/11/21 0136 05/11/21 0215 05/11/21 0317 05/11/21 0417 05/11/21 0738  GLUCAP 231* 239* 285* 301* 270*    No Known Allergies   Medications  Scheduled Meds:  vitamin C  500 mg Oral BID   atorvastatin  40 mg Oral Daily   budesonide (PULMICORT) nebulizer solution  0.25 mg Nebulization BID   Chlorhexidine Gluconate Cloth  6 each Topical Q0600   docusate  100 mg Per Tube BID   free water  30 mL Per Tube Q4H   heparin injection (subcutaneous)  5,000 Units Subcutaneous Q8H   hydrocortisone sod succinate (SOLU-CORTEF) inj  100 mg Intravenous Q8H   ipratropium-albuterol  3 mL Nebulization Q6H   multivitamin with minerals  1 tablet Per Tube Daily   nutrition supplement (JUVEN)  1 packet Per Tube BID BM   pantoprazole (PROTONIX) IV  40 mg Intravenous Q24H   polyethylene glycol  17 g Per Tube Daily   sodium chloride flush  10-40 mL Intracatheter Q12H   sodium zirconium cyclosilicate  10 g Per Tube TID   zinc sulfate  220 mg Per Tube Daily   Continuous Infusions:  sodium chloride Stopped (05/10/21 2242)   albumin human 25 g (05/11/21 0913)   calcium gluconate     DOPamine 20 mcg/kg/min (05/11/21 0609)   epinephrine 20 mcg/min (05/11/21 0912)   feeding supplement (PIVOT 1.5 CAL) Stopped (05/10/21 2054)   HYDROmorphone Stopped (05/11/21 0345)   insulin     linezolid (ZYVOX) IV Stopped (05/11/21 0014)   norepinephrine (LEVOPHED) Adult infusion 40 mcg/min (05/11/21 0907)   pencillin G potassium IV 3 Million Units (05/11/21 0522)    sodium bicarbonate (isotonic) infusion in sterile water 125 mL/hr at 05/11/21 0600    vasopressin 0.04 Units/min (05/11/21 0908)   PRN Meds:.dextrose, docusate sodium, HYDROmorphone, lip balm, ondansetron (ZOFRAN) IV, oxyCODONE, polyethylene glycol, sodium chloride flush   The patient is critically ill with multiple organ systems failure and requires high complexity decision making for assessment and support, frequent evaluation and titration of therapies, application of advanced monitoring technologies and extensive interpretation of multiple databases. Critical Care Time devoted to patient care services described in this note is 45 minutes.  Discussed with patient's family and updated.  Discussed with Dr. Peyton Najjar.  Discussed with Dr. Theador Hawthorne, nephrology.  Renold Don, MD Advanced Bronchoscopy PCCM [facility]    *This note was dictated using voice recognition software/Dragon.  Despite best efforts to proofread, errors can occur which can change the meaning. Any transcriptional errors that result from this process are unintentional and may not be fully corrected at the time of dictation.

## 2021-05-11 NOTE — Progress Notes (Signed)
CRRT stopped negative pressure alarms. Will re-start.

## 2021-05-11 NOTE — Progress Notes (Signed)
Patient ID: Alexandra Foster, female   DOB: 1950-08-22, 71 y.o.   MRN: [id number]     [facility] Day(s): 7.   Interval History: Patient seen and examined.  Patient clinically deteriorated overnight.  She has needed increased dose of vasopressors.  She also on mechanical ventilation this morning.  Veraflow wound VAC having issues of leg but the wound VAC therapy to suction it with adequate negative pressure.  Vital signs in last 24 hours: [min-max] current  Temp:  [93.9 F (34.4 C)-99.9 F (37.7 C)] 99.9 F (37.7 C) (01/28 0930) Pulse Rate:  [28-164] 94 (01/28 0330) Resp:  [14-29] 20 (01/28 0930) BP: (44-180)/(15-150) 141/47 (01/28 0930) SpO2:  [43 %-99 %] 93 % (01/28 0930) FiO2 (%):  [70 %-100 %] 70 % (01/28 0930) Weight:  [128.3 kg] 128.3 kg (01/28 0500)     Height: 5\' 5"  (165.1 cm) Weight: 128.3 kg BMI (Calculated): 47.07   Physical Exam:  Constitutional: Critically ill, sedated on mechanical ventilation Extremity: Left leg with negative pressure dressing in place.  No sign of ischemia.  Irrigating fluid is serosanguineous  Labs:  CBC Latest Ref Rng & Units 05/11/2021 05/11/2021 05/10/2021  WBC 4.0 - 10.5 K/uL 42.8(H) 42.7(H) 40.9(H)  Hemoglobin 12.0 - 15.0 g/dL 10.8(L) 9.7(L) 4.9(LL)  Hematocrit 36.0 - 46.0 % 33.2(L) 32.2(L) 16.3(L)  Platelets 150 - 400 K/uL 219 208 267   CMP Latest Ref Rng & Units 05/11/2021 05/11/2021 05/10/2021  Glucose 70 - 99 mg/dL 368(H) 343(H) 24(LL)  BUN 8 - 23 mg/dL 92(H) 84(H) 86(H)  Creatinine 0.44 - 1.00 mg/dL 4.21(H) 4.12(H) 4.32(H)  Sodium 135 - 145 mmol/L 137 135 138  Potassium 3.5 - 5.1 mmol/L 5.4(H) 6.0(H) >7.5(HH)  Chloride 98 - 111 mmol/L 98 99 106  CO2 22 - 32 mmol/L 15(L) 10(L) <7(L)  Calcium 8.9 - 10.3 mg/dL 6.8(L) 7.0(L) 7.5(L)  Total Protein 6.5 - 8.1 g/dL 4.6(L) - -  Total Bilirubin 0.3 - 1.2 mg/dL 2.5(H) - -  Alkaline Phos 38 - 126 U/L 185(H) - -  AST 15 - 41 U/L 4,359(H) - -  ALT 0 - 44 U/L 1,746(H) - -     Imaging studies: No new pertinent imaging studies   Assessment/Plan:  71 y.o. female with left leg necrotizing fasciitis with 3 Day Post-Op s/p debridement, complicated by pertinent comorbidities including strep pyogenes bacteremia now in septic shock, respiratory failure mechanical ventilation, COPD, type 2 diabetes mellitus, hypertension, obstructive sleep apnea, GERD   Left leg necrotizing fasciitis -S/p debridement 05/07/2021 -Second debridement on 05/10/2021.  Application of vera flow wound VAC system -The irrigation system is causing a lot of leaks but the regular wound VAC therapy with adequate seal and negative pressure.  We will keep regular wound VAC therapy. -Patient clinically deteriorating.  Currently critically ill, and septic shock on max dose vasopressors, respiratory failure on mechanical ventilation.  Continue management by primary care team. 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 -I will continue to follow closely.  Arnold Long, MD

## 2021-05-11 NOTE — Plan of Care (Signed)
Continuing with plan of care. 

## 2021-05-11 NOTE — Progress Notes (Signed)
CRRT started 14:35.

## 2021-05-11 NOTE — Progress Notes (Signed)
°   05/11/21 2020  Clinical Encounter Type  Visited With Family  Visit Type Follow-up  Spiritual Encounters  Spiritual Needs Prayer   Chaplain Burris returned to check on family's well-being. Met with Alexandra Foster earlier and she is now joined by her father and two aunts. Daryel November gathered the family in ICU waiting area for prayer. Chaplain B also continued to encourage their self-care and let to them know that Daryel November would be available to offer support throughout the night as needed.

## 2021-05-11 NOTE — Progress Notes (Signed)
°   05/11/21 1340  Clinical Encounter Type  Visited With Family;Health care provider  Visit Type Initial  Spiritual Encounters  Spiritual Needs Prayer;Emotional   Chaplain Burris checked-in with care team; informed them of my presence and availability. Met with Pt's daugther, Berenice Primas, in ICU waiting. Chaplain Burris offered compassionate, non-anxious presence and active listening. Chaplain B also offered prayer at daughter's request. Offered coninued pastoral presence and support; will continue to follow.

## 2021-05-11 NOTE — Consult Note (Signed)
Alexandra Foster MRN: [id number] DOB/AGE: Dec 02, 1950 71 y.o. Primary Care Physician:Sowles, [name], MD Admit date: 05/04/2021 Chief Complaint:  Chief Complaint  Patient presents with   Leg Pain   HPI: Patient is a 71 year old African-American female with a past medical history of diabetes mellitus type 2, hypertension, obstructive sleep apnea on CPAP, GERD, history of COVID-19 pneumonia who came to the ER on January 21 with chief complaint of left lower extremity pain and chills.  Upon evaluation in the ER patient was found to be hypotensive with SBP of 75 mmHg, tachypneic with respirate of 27 breaths/min.   Patient had lactic acidosis of 6.0. Patient was admitted with septic shock secondary to cellulitis of left lower leg.  Patient was admitted to ICU with toxic shock syndrome and acute kidney injury Patient blood cultures later came back positive for Streptococcus pyogenes bacteremia. Surgery was consulted for worsening of lower extremity edema and development of bullae .  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 fib with RVR and cardiology was consulted Nephrology was consulted for worsening of her AKI Patient is currently intubated and on maximum dose of vasopressors        Past Medical History:  Diagnosis Date   AKI (acute kidney injury) (HCC)    a. 04/2021 in setting of bacteremia/shock.   Arthritis    Asthma    Bacteremia    a. 04/2021 S pyogenes bacteremia in setting of lower ext cellulitis.   COPD (chronic obstructive pulmonary disease) (HCC)    Diabetes mellitus without complication (HCC)    Endometriosis    GERD (gastroesophageal reflux disease)    History of echocardiogram    a. 07/2013 Echo: EF 55-60%, impaired relaxation, mild TR; b. 04/2021 Echo: EF 50-55%, mild LVH, nl RV fxn, mild BAE, Ao sclerosis w/o stenosis.   Hypertension     Obesity    PAF (paroxysmal atrial fibrillation) (HCC)    a. 04/2021 in setting of septic shock/cellulitis.   Sleep apnea    CPAP        Family History  Problem Relation Age of Onset   Congestive Heart Failure Mother    Coronary artery disease Father 60   Breast cancer Sister 58   Heart disease Brother    Varicose Veins Brother    Alcohol abuse Brother     Social History:  reports that she quit smoking about 20 years ago. Her smoking use included cigarettes. She has a 80.00 pack-year smoking history. She quit smokeless tobacco use about 20 years ago.  Her smokeless tobacco use included snuff. She reports that she does not drink alcohol and does not use drugs.   Allergies: No Known Allergies  Medications Prior to Admission  Medication Sig Dispense Refill   albuterol (PROVENTIL) (2.5 MG/3ML) 0.083% nebulizer solution Take 3 mLs (2.5 mg total) by nebulization every 6 (six) hours as needed for wheezing or shortness of breath. 150 mL 1   aspirin 81 MG tablet Take 1 tablet (81 mg total) by mouth daily. 30 tablet 0   atorvastatin (LIPITOR) 40 MG tablet Take 1 tablet (40 mg total) by mouth daily. 90 tablet 1   azelastine (ASTELIN) 0.1 % nasal spray Place 2 sprays into both nostrils 2 (two) times daily as needed for rhinitis or allergies. 30 mL 1   baclofen (LIORESAL) 10 MG tablet Take 10 mg by mouth daily.     celecoxib (CELEBREX)  200 MG capsule Take 200 mg by mouth daily.     Cetirizine HCl (ZYRTEC ALLERGY) 10 MG TBDP Take 10 mg by mouth at bedtime. For sinus symptoms and seasonal allergies 30 tablet 1   docusate sodium (COLACE) 100 MG capsule Take 100 mg by mouth daily.     fluticasone (FLONASE) 50 MCG/ACT nasal spray      glucose blood (ACCU-CHEK AVIVA PLUS) test strip Use as instructed 100 each 12   Lancets (ACCU-CHEK SOFT TOUCH) lancets Use as instructed 100 each 12   meloxicam (MOBIC) 15 MG tablet Take 15 mg by mouth daily.     metFORMIN (GLUCOPHAGE-XR) 750 MG 24 hr tablet Take 1  tablet (750 mg total) by mouth daily with breakfast. 90 tablet 1   montelukast (SINGULAIR) 10 MG tablet Take 1 tablet (10 mg total) by mouth at bedtime. 90 tablet 1   Multiple Vitamins-Minerals (MULTIVITAL PO) Take 1 tablet by mouth daily.     omeprazole (PRILOSEC) 20 MG capsule Take 1 capsule (20 mg total) by mouth daily. 90 capsule 1   ORTHOVISC 30 MG/2ML SOSY      triamterene-hydrochlorothiazide (DYAZIDE) 37.5-25 MG capsule TAKE 1 CAPSULE EVERY DAY 90 capsule 1   vitamin B-12 (CYANOCOBALAMIN) 100 MCG tablet Take 100 mcg by mouth daily.     calcium carbonate (OSCAL) 1500 (600 Ca) MG TABS tablet Take 600 mg of elemental calcium by mouth 2 (two) times daily with a meal. (Patient not taking: Reported on 05/05/2021)     Fluticasone-Umeclidin-Vilant (TRELEGY ELLIPTA) 100-62.5-25 MCG/INH AEPB Inhale 1 puff into the lungs daily. In place of Stiolto 180 each 1   loratadine (CLARITIN) 10 MG tablet Take 1 tablet (10 mg total) by mouth daily. 90 tablet 1       ZOX:WRUEAVW is not able to offer any complaints  Medications  vitamin C  500 mg Oral BID   atorvastatin  40 mg Oral Daily   budesonide (PULMICORT) nebulizer solution  0.25 mg Nebulization BID   chlorhexidine gluconate (MEDLINE KIT)  15 mL Mouth Rinse BID   Chlorhexidine Gluconate Cloth  6 each Topical Q0600   docusate  100 mg Per Tube BID   free water  30 mL Per Tube Q4H   heparin injection (subcutaneous)  5,000 Units Subcutaneous Q8H   hydrocortisone sod succinate (SOLU-CORTEF) inj  100 mg Intravenous Q8H   ipratropium-albuterol  3 mL Nebulization Q6H   mouth rinse  15 mL Mouth Rinse 10 times per day   multivitamin with minerals  1 tablet Per Tube Daily   nutrition supplement (JUVEN)  1 packet Per Tube BID BM   pantoprazole (PROTONIX) IV  40 mg Intravenous Q24H   polyethylene glycol  17 g Per Tube Daily   sodium chloride flush  10-40 mL Intracatheter Q12H   zinc sulfate  220 mg Per Tube Daily      Physical Exam: Vital signs in  last 24 hours: Temp:  [93.9 F (34.4 C)-100.8 F (38.2 C)] 98.8 F (37.1 C) (01/28 1600) Pulse Rate:  [28-164] 107 (01/28 1400) Resp:  [14-29] 24 (01/28 1600) BP: (44-180)/(15-150) 123/48 (01/28 1600) SpO2:  [43 %-100 %] 99 % (01/28 1600) FiO2 (%):  [70 %-100 %] 70 % (01/28 1600) Weight:  [128.3 kg] 128.3 kg (01/28 0500) Weight change:  Last BM Date: 05/10/21  Intake/Output from previous day: 01/27 0701 - 01/28 0700 In: 9710.5 [P.O.:600; I.V.:4965.1; Blood:1661; IV Piggyback:2234.4] Out: 765 [Urine:755; Blood:10] Total I/O In: 3310.7 [I.V.:2804.9; Other:50; IV Piggyback:455.8] Out: 184 [  Urine:75; Other:109]   Physical Exam:  General- Patient is critically ill appearing,  Resp- Intubated rhonchi present bilaterally, breath sounds present bilaterally  CVS- S1S2 irregular in rate and rhythm  GIT- BS+, soft, NT, ND  EXT- 2+ LE Edema,  Left leg with negative pressure dressing Wound VAC in situ  CNS- CN 2-12 grossly intact. Moving all 4 extremities  Psych- Unable to assess Access- Temporary catheter was placed   Lab Results: CBC Recent Labs    05/11/21 0040 05/11/21 0531 05/11/21 1239  WBC 42.7* 42.8*  --   HGB 9.7* 10.8* 9.9*  HCT 32.2* 33.2* 29.8*  PLT 208 219  --     BMET Recent Labs    05/11/21 0040 05/11/21 0531  NA 135 137  K 6.0* 5.4*  CL 99 98  CO2 10* 15*  GLUCOSE 343* 368*  BUN 84* 92*  CREATININE 4.12* 4.21*  CALCIUM 7.0* 6.8*    MICRO Recent Results (from the past 240 hour(s))  Blood Culture (routine x 2)     Status: Abnormal   Collection Time: 05/04/21  7:43 PM   Specimen: BLOOD  Result Value Ref Range Status   Specimen Description   Final    BLOOD RIGHT ANTECUBITAL Performed at [facility], [address] [place], [place] [address]    Special Requests   Final    BOTTLES DRAWN AEROBIC AND ANAEROBIC Blood Culture adequate volume Performed at [facility], [address] [place], [place] [address]    Culture   Setup Time   Final    GRAM POSITIVE COCCI IN BOTH AEROBIC AND ANAEROBIC BOTTLES RESULT CALLED TO, READ BACK BY AND VERIFIED WITH: Delight Stare 05/05/21 @ 1007 BY SB    Culture (A)  Final    GROUP A STREP (S.PYOGENES) ISOLATED HEALTH DEPARTMENT NOTIFIED Performed at [facility], [address], [place], [place] [address]    Report Status 05/07/2021 FINAL  Final   Organism ID, Bacteria GROUP A STREP (S.PYOGENES) ISOLATED  Final      Susceptibility   Group a strep (s.pyogenes) isolated - MIC*    PENICILLIN <=0.06 SENSITIVE Sensitive     CEFTRIAXONE <=0.12 SENSITIVE Sensitive     ERYTHROMYCIN <=0.12 SENSITIVE Sensitive     LEVOFLOXACIN 1 SENSITIVE Sensitive     VANCOMYCIN 0.5 SENSITIVE Sensitive     * GROUP A STREP (S.PYOGENES) ISOLATED  Blood Culture ID Panel (Reflexed)     Status: Abnormal   Collection Time: 05/04/21  7:43 PM  Result Value Ref Range Status   Enterococcus faecalis NOT DETECTED NOT DETECTED Final   Enterococcus Faecium NOT DETECTED NOT DETECTED Final   Listeria monocytogenes NOT DETECTED NOT DETECTED Final   Staphylococcus species NOT DETECTED NOT DETECTED Final   Staphylococcus aureus (BCID) NOT DETECTED NOT DETECTED Final   Staphylococcus epidermidis NOT DETECTED NOT DETECTED Final   Staphylococcus lugdunensis NOT DETECTED NOT DETECTED Final   Streptococcus species DETECTED (A) NOT DETECTED Final    Comment: RESULT CALLED TO, READ BACK BY AND VERIFIED WITH: Delight Stare 05/05/21 @ 1007 BY SB    Streptococcus agalactiae NOT DETECTED NOT DETECTED Final   Streptococcus pneumoniae NOT DETECTED NOT DETECTED Final   Streptococcus pyogenes DETECTED (A) NOT DETECTED Final    Comment: RESULT CALLED TO, READ BACK BY AND VERIFIED WITH: Delight Stare 05/05/21 @ 1007 BY SB    A.calcoaceticus-baumannii NOT DETECTED NOT DETECTED Final   Bacteroides fragilis NOT DETECTED NOT DETECTED Final   Enterobacterales  NOT DETECTED NOT DETECTED Final   Enterobacter cloacae  complex NOT DETECTED NOT DETECTED Final   Escherichia coli NOT DETECTED NOT DETECTED Final   Klebsiella aerogenes NOT DETECTED NOT DETECTED Final   Klebsiella oxytoca NOT DETECTED NOT DETECTED Final   Klebsiella pneumoniae NOT DETECTED NOT DETECTED Final   Proteus species NOT DETECTED NOT DETECTED Final   Salmonella species NOT DETECTED NOT DETECTED Final   Serratia marcescens NOT DETECTED NOT DETECTED Final   Haemophilus influenzae NOT DETECTED NOT DETECTED Final   Neisseria meningitidis NOT DETECTED NOT DETECTED Final   Pseudomonas aeruginosa NOT DETECTED NOT DETECTED Final   Stenotrophomonas maltophilia NOT DETECTED NOT DETECTED Final   Candida albicans NOT DETECTED NOT DETECTED Final   Candida auris NOT DETECTED NOT DETECTED Final   Candida glabrata NOT DETECTED NOT DETECTED Final   Candida krusei NOT DETECTED NOT DETECTED Final   Candida parapsilosis NOT DETECTED NOT DETECTED Final   Candida tropicalis NOT DETECTED NOT DETECTED Final   Cryptococcus neoformans/gattii NOT DETECTED NOT DETECTED Final    Comment: Performed at [facility], [address] [place], [place] [address]  Blood Culture (routine x 2)     Status: Abnormal   Collection Time: 05/04/21  9:29 PM   Specimen: BLOOD  Result Value Ref Range Status   Specimen Description   Final    BLOOD RIGHT ANTECUBITAL Performed at [facility], [address] [place], [place] [address]    Special Requests   Final    BOTTLES DRAWN AEROBIC AND ANAEROBIC Blood Culture adequate volume Performed at [facility], [address] [place], [place] [address]    Culture  Setup Time   Final    GRAM POSITIVE COCCI IN BOTH AEROBIC AND ANAEROBIC BOTTLES CRITICAL VALUE NOTED.  VALUE IS CONSISTENT WITH PREVIOUSLY REPORTED AND CALLED VALUE. Performed at [facility], [address], [place], [place] [address]    Culture (A)  Final    GROUP A STREP (S.PYOGENES) ISOLATED HEALTH DEPARTMENT  NOTIFIED SUSCEPTIBILITIES PERFORMED ON PREVIOUS CULTURE WITHIN THE LAST 5 DAYS. STAPHYLOCOCCUS EPIDERMIDIS THE SIGNIFICANCE OF ISOLATING THIS ORGANISM FROM A SINGLE SET OF BLOOD CULTURES WHEN MULTIPLE SETS ARE DRAWN IS UNCERTAIN. PLEASE NOTIFY THE MICROBIOLOGY DEPARTMENT WITHIN ONE WEEK IF SPECIATION AND SENSITIVITIES ARE REQUIRED. Performed at [facility], [address] [place], [place] [address]    Report Status 05/08/2021 FINAL  Final  Resp Panel by RT-PCR (Flu A&B, Covid) Nasopharyngeal Swab     Status: None   Collection Time: 05/04/21  9:50 PM   Specimen: Nasopharyngeal Swab; Nasopharyngeal(NP) swabs in vial transport medium  Result Value Ref Range Status   SARS Coronavirus 2 by RT PCR NEGATIVE NEGATIVE Final    Comment: (NOTE) SARS-CoV-2 target nucleic acids are NOT DETECTED.  The SARS-CoV-2 RNA is generally detectable in upper respiratory specimens during the acute phase of infection. The lowest concentration of SARS-CoV-2 viral copies this assay can detect is 138 copies/mL. A negative result does not preclude SARS-Cov-2 infection and should not be used as the sole basis for treatment or other patient management decisions. A negative result may occur with  improper specimen collection/handling, submission of specimen other than nasopharyngeal swab, presence of viral mutation(s) within the areas targeted by this assay, and inadequate number of viral copies(<138 copies/mL). A negative result must be combined with clinical observations, patient history, and epidemiological information. The expected result is Negative.  Fact Sheet for Patients:  BloggerCourse.com  Fact Sheet for Healthcare  Providers:  SeriousBroker.it  This test is no t yet approved or cleared by the [place] and  has been authorized for detection and/or diagnosis of SARS-CoV-2 by FDA under an Emergency Use Authorization (EUA). This EUA will  remain  in effect (meaning this test can be used) for the duration of the COVID-19 declaration under Section 564(b)(1) of the Act, 21 U.S.C.section 360bbb-3(b)(1), unless the authorization is terminated  or revoked sooner.       Influenza A by PCR NEGATIVE NEGATIVE Final   Influenza B by PCR NEGATIVE NEGATIVE Final    Comment: (NOTE) The Xpert Xpress SARS-CoV-2/FLU/RSV plus assay is intended as an aid in the diagnosis of influenza from Nasopharyngeal swab specimens and should not be used as a sole basis for treatment. Nasal washings and aspirates are unacceptable for Xpert Xpress SARS-CoV-2/FLU/RSV testing.  Fact Sheet for Patients: BloggerCourse.com  Fact Sheet for Healthcare Providers: SeriousBroker.it  This test is not yet approved or cleared by the [place] FDA and has been authorized for detection and/or diagnosis of SARS-CoV-2 by FDA under an Emergency Use Authorization (EUA). This EUA will remain in effect (meaning this test can be used) for the duration of the COVID-19 declaration under Section 564(b)(1) of the Act, 21 U.S.C. section 360bbb-3(b)(1), unless the authorization is terminated or revoked.  Performed at [facility], [address] [place], [place] [address]   MRSA Next Gen by PCR, Nasal     Status: None   Collection Time: 05/05/21 11:02 AM   Specimen: Nasal Mucosa; Nasal Swab  Result Value Ref Range Status   MRSA by PCR Next Gen NOT DETECTED NOT DETECTED Final    Comment: (NOTE) The GeneXpert MRSA Assay (FDA approved for NASAL specimens only), is one component of a comprehensive MRSA colonization surveillance program. It is not intended to diagnose MRSA infection nor to guide or monitor treatment for MRSA infections. Test performance is not FDA approved in patients less than 58 years old. Performed at [facility], [address], [place], [place] [address]   Urine Culture      Status: None   Collection Time: 05/07/21  2:30 PM   Specimen: Urine, Random  Result Value Ref Range Status   Specimen Description   Final    URINE, RANDOM Performed at [facility], [address], [place], [place] [address]    Special Requests   Final    NONE Performed at [facility], [address] [place], [place] [address]    Culture   Final    NO GROWTH Performed at [facility], [address] [place], [place] [address]    Report Status 05/09/2021 FINAL  Final  Aerobic/Anaerobic Culture w Gram Stain (surgical/deep wound)     Status: None (Preliminary result)   Collection Time: 05/07/21  9:35 PM   Specimen: PATH Other; Tissue  Result Value Ref Range Status   Specimen Description   Final    LEG LEFT Performed at [facility], [address] [place], [place] [address]    Special Requests PT PREVIOUSLY ON CLINDAMYCIN,PENICILLIN  Final   Gram Stain   Final    RARE WBC PRESENT,BOTH PMN AND MONONUCLEAR NO ORGANISMS SEEN    Culture   Final    NO GROWTH 3 DAYS NO ANAEROBES ISOLATED; CULTURE IN PROGRESS FOR 5 DAYS Performed at [facility], [address] [place], [place] [address]    Report Status PENDING  Incomplete  CULTURE,  BLOOD (ROUTINE X 2) w Reflex to ID Panel     Status: None (Preliminary result)   Collection Time: 05/09/21  7:13 AM   Specimen: BLOOD  Result Value Ref Range Status   Specimen Description BLOOD BLOOD LEFT HAND  Final   Special Requests   Final    BOTTLES DRAWN AEROBIC AND ANAEROBIC Blood Culture adequate volume   Culture   Final    NO GROWTH 2 DAYS Performed at [facility], [address] [place], [place] [address]    Report Status PENDING  Incomplete  CULTURE, BLOOD (ROUTINE X 2) w Reflex to ID Panel     Status: None (Preliminary result)   Collection Time: 05/09/21  7:23 AM   Specimen: BLOOD  Result Value Ref Range Status   Specimen Description BLOOD LEFT THUMB  Final   Special Requests    Final    BOTTLES DRAWN AEROBIC AND ANAEROBIC Blood Culture results may not be optimal due to an inadequate volume of blood received in culture bottles   Culture   Final    NO GROWTH 2 DAYS Performed at [facility], [address] [place], [place] [address]    Report Status PENDING  Incomplete      Lab Results  Component Value Date   CALCIUM 6.8 (L) 05/11/2021   PHOS 14.2 (H) 05/11/2021      Impression:      1)Renal    Acute kidney injury Patient has AKI secondary to ATN Patient has ATN secondary to septic shock Patient creatinine at the time of admission was 3.5 Patient's creatinine has worsened to now 4.2  Patient acute kidney injury is worsening patient hyperkalemia is worsening Patient acidosis is worsening Patient needs renal replacement therapy I had extensive discussion with the patient's family, husband and daughter.  I did discussion with the patient's ICU team. I discussed with the patient family about need/risk/benefit of CRRT.  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    Lab Results  Component Value Date   CALCIUM 6.8 (L) 05/11/2021   PHOS 14.2 (H) 05/11/2021    Renal failure associated hyperphosphatemia  5) septic shock Patient is on IV pressors Patient is on antibiotics   6) Electrolytes   BMP Latest Ref Rng & Units 05/11/2021 05/11/2021 05/10/2021  Glucose 70 - 99 mg/dL 865(H) 846(N) 62(XB)  BUN 8 - 23 mg/dL 28(U) 13(K) 44(W)   Creatinine 0.44 - 1.00 mg/dL 1.02(V) 2.53(G) 6.44(I)  BUN/Creat Ratio 6 - 22 (calc) - - -  Sodium 135 - 145 mmol/L 137 135 138  Potassium 3.5 - 5.1 mmol/L 5.4(H) 6.0(H) >7.5(HH)  Chloride 98 - 111 mmol/L 98 99 106  CO2 22 - 32 mmol/L 15(L) 10(L) <7(L)  Calcium 8.9 - 10.3 mg/dL 3.4(V) 7.0(L) 7.5(L)     Sodium Normonatremic   Potassium Hyperkalemia Patient is hyperkalemic secondary to AKI     7)Acute metabolic acidosis Patient venous pH had dropped to less than 6.9 last night and was stable  at 7 Patient had lactic acidosis-Patient lactate at the peak was 6.0   Anion gap 137-113 is equal to 24 Patient albumin is less than 1.5  Patient delta anion gap is 24-2 =22 Patient is delta bicarb is 24-15 is equal to 9-patient was also on IV bicarb  8) acute respiratory failure Patient is currently intubated  Plan  Patient requires renal replacement therapy Patient is acidotic, hyperkalemic, with phosphorus of more than 14. I had extensive discussion with the patient family, ICU team about need/risk/benefit renal placement therapy I spent more than 90 minutes in the critical care of the patient.     Oaklyn Jakubek s Wolfgang Phoenix 05/11/2021, 5:08 PM

## 2021-05-11 NOTE — Progress Notes (Signed)
PHARMACY -  BRIEF CONSULT NOTE  Pharmacy has received consult(s) for IVIG therapy for streptococcal toxic shock syndrome: 1 g/kg of ideal body weight on day 1, followed by 0.5 g/kg on days 2 and 3. The patient's profile has been reviewed for ht/wt/allergies/indication/available labs.    Order(s) placed for IVIG 55 g on day 1 followed by IVIG 30 g on days 2 and 3.                   Thank you, Forde Dandy Ragena Fiola 05/11/2021  4:28 PM

## 2021-05-11 NOTE — Progress Notes (Signed)
Shift summary: Patient deteriorating at shift change, requiring re-intubation, max dose vasopressors, D10 (briefly for BG 25), several amps of bicarb and hyperkalemic workups. 275 UOP total. Wound vac has severe leak, 500 output. Surgery and wound vac rep contacted about dressing. Patient started to arouse around 45 and was dyssynch with vent. Low dose diladid started. Family at bedside.

## 2021-05-11 NOTE — Progress Notes (Addendum)
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 No purposeful movement noted. No response to stimuli or blink to threat. Event resolved without PRN administration, lasting about 2 minutes. Due to liver shock, will avoid valproic acid instead treating with Keppra. - load with 1000 mg of keppra, followed by 1000 mg BID while on CRRT - versed PRN for seizure like activity - EEG ordered - consider CTH/MRI once patient's hemodynamics have stabilized - consulted neurology, appreciate input   Venetia Night, AGACNP-BC Acute Care Nurse Practitioner [place]   [phone] / [phone] Please see Amion for pager details.

## 2021-05-11 NOTE — Progress Notes (Signed)
Progress Note  Patient Name: Alexandra Foster Date of Encounter: 05/11/2021  [facility] Cardiologist: Kathlyn Sacramento, MD    Patient Profile     71 y.o. female with a history of asthma/COPD, HTN, DMII, obesity, OSA on CPAP, GERD, and COVID-19 pneumonia, who was admitted 1/22 w/ L LE necrotizing fasciitis, septic shock, S pyogenes bacteremia, hypotension requiring phenylephrine and norepinephrine and AKI and severe anemia.  Developed rapid Afib 1/23 Converted to sinus vs ectopic atrial rhythm early on 1/24 on amio.  Because of anemia anticoagulation was deferred.  s/p debridement of LLE.  Subjective   1/27 hemodynamic deterioration requiring intubation back on pressors indeed, epinephrine, norepinephrine, dopamine, vasopressin  Inpatient Medications    Scheduled Meds:  vitamin C  500 mg Oral BID   atorvastatin  40 mg Oral Daily   budesonide (PULMICORT) nebulizer solution  0.25 mg Nebulization BID   Chlorhexidine Gluconate Cloth  6 each Topical Q0600   docusate  100 mg Per Tube BID   free water  30 mL Per Tube Q4H   heparin injection (subcutaneous)  5,000 Units Subcutaneous Q8H   hydrocortisone sod succinate (SOLU-CORTEF) inj  100 mg Intravenous Q8H   ipratropium-albuterol  3 mL Nebulization Q6H   multivitamin with minerals  1 tablet Per Tube Daily   nutrition supplement (JUVEN)  1 packet Per Tube BID BM   pantoprazole (PROTONIX) IV  40 mg Intravenous Q24H   polyethylene glycol  17 g Per Tube Daily   sodium chloride flush  10-40 mL Intracatheter Q12H   sodium zirconium cyclosilicate  10 g Per Tube TID   zinc sulfate  220 mg Per Tube Daily   Continuous Infusions:  sodium chloride Stopped (05/10/21 2242)   albumin human 25 g (05/11/21 0913)   DOPamine 20 mcg/kg/min (05/11/21 1136)   epinephrine 20 mcg/min (05/11/21 0912)   feeding supplement (PIVOT 1.5 CAL) Stopped (05/10/21 2054)   HYDROmorphone Stopped (05/11/21 0345)   insulin 14 Units/hr (05/11/21 1032)    linezolid (ZYVOX) IV 600 mg (05/11/21 1101)   norepinephrine (LEVOPHED) Adult infusion 40 mcg/min (05/11/21 0907)   pencillin G potassium IV 3 Million Units (05/11/21 1052)   prismasol BGK 2/2.5 dialysis solution      sodium bicarbonate (isotonic) infusion in sterile water 125 mL/hr at 05/11/21 0600   vasopressin 0.04 Units/min (05/11/21 0908)   PRN Meds: dextrose, docusate sodium, heparin, HYDROmorphone, lip balm, ondansetron (ZOFRAN) IV, oxyCODONE, polyethylene glycol, sodium chloride, sodium chloride flush   Vital Signs    Vitals:   05/11/21 0800 05/11/21 0830 05/11/21 0900 05/11/21 0930  BP: (!) 115/51 123/60 128/64 (!) 141/47  Pulse:      Resp: 20 18 20 20   Temp: 99.1 F (37.3 C) 99.3 F (37.4 C) 99.7 F (37.6 C) 99.9 F (37.7 C)  TempSrc: Esophageal Esophageal Esophageal Esophageal  SpO2: 93% 93% 93% 93%  Weight:      Height:        Intake/Output Summary (Last 24 hours) at 05/11/2021 1159 Last data filed at 05/11/2021 1601 Gross per 24 hour  Intake 9710.48 ml  Output 515 ml  Net 9195.48 ml    Last 3 Weights 05/11/2021 05/05/2021 05/04/2021  Weight (lbs) 282 lb 13.6 oz 272 lb 0.8 oz 266 lb  Weight (kg) 128.3 kg 123.4 kg 120.657 kg      Telemetry    SR, HR60's>>110s - personally reviewed   ECG     Physical Exam   Well developed and nourished intubated and sedated  HENT normal Neck supple  Clear good air movement bilaterally laterally  Regular but rapid rate and rhythm,  2  over 6 systolic murmur  abd-soft with active BS No Clubbing cyanosis edema Skin-warm upper extremities     Labs    High Sensitivity Troponin:  No results for input(s): TROPONINIHS in the last 720 hours.   Chemistry Recent Labs  Lab 05/08/21 1313 05/09/21 0545 05/10/21 0450 05/10/21 2040 05/11/21 0040 05/11/21 0531  NA  --    < > 133* 138 135 137  K  --    < > 4.6 >7.5* 6.0* 5.4*  CL  --    < > 101 106 99 98  CO2  --    < > 19* <7* 10* 15*  GLUCOSE  --    < > 145* 24* 343*  368*  BUN  --    < > 83* 86* 84* 92*  CREATININE  --    < > 3.68* 4.32* 4.12* 4.21*  CALCIUM  --    < > 7.5* 7.5* 7.0* 6.8*  MG  --    < > 3.0* 3.6*  --  2.9*  PROT 4.8*  --  5.0*  --   --  4.6*  ALBUMIN <1.5*  --  <1.5*  --   --  <1.5*  AST 136*  --  97*  --   --  4,359*  ALT 47*  --  36  --   --  1,746*  ALKPHOS 135*  --  145*  --   --  185*  BILITOT 3.8*  --  1.8*  --   --  2.5*  GFRNONAA  --    < > 13* 10* 11* 11*  ANIONGAP  --    < > 13 NOT CALCULATED 26* 24*   < > = values in this interval not displayed.     Lipids  Recent Labs  Lab 05/11/21 0531  TRIG 145     Hematology Recent Labs  Lab 05/10/21 2040 05/11/21 0040 05/11/21 0531  WBC 40.9* 42.7* 42.8*  RBC 1.88* 3.52* 3.76*  HGB 4.9* 9.7* 10.8*  HCT 16.3* 32.2* 33.2*  MCV 86.7 91.5 88.3  MCH 26.1 27.6 28.7  MCHC 30.1 30.1 32.5  RDW 19.9* 18.5* 18.6*  PLT 267 208 219    Thyroid No results for input(s): TSH, FREET4 in the last 168 hours.  BNPNo results for input(s): BNP, PROBNP in the last 168 hours.  DDimer  Recent Labs  Lab 05/05/21 0027  DDIMER 2.22*      Radiology    DG Abd 1 View  Result Date: 05/11/2021 CLINICAL DATA:  NG tube placement. EXAM: ABDOMEN - 1 VIEW COMPARISON:  None. FINDINGS: Nasal/orogastric tube passes below the diaphragm, well into the stomach, tip below the included field of view. Endotracheal tube tip projects 3 cm above the carina. IMPRESSION: 1. Well-positioned nasal/orogastric tube. Electronically Signed   By: Lajean Manes M.D.   On: 05/11/2021 11:33   DG Chest Port 1 View  Result Date: 05/10/2021 CLINICAL DATA:  Post intubation, OG tube EXAM: PORTABLE CHEST 1 VIEW COMPARISON:  05/10/2021 at 1906 hours FINDINGS: Lungs are essentially clear.  No pleural effusion or pneumothorax. Endotracheal tube terminates 3 cm above the carina. Enteric tube courses into the stomach. Right IJ venous catheter terminates at the cavoatrial junction. Defibrillator pads overlying the chest.  IMPRESSION: Endotracheal tube terminates 3 cm above the carina. Enteric tube courses into the stomach. Electronically Signed   By:  Julian Hy M.D.   On: 05/10/2021 21:29   DG Chest Port 1 View  Result Date: 05/10/2021 CLINICAL DATA:  Short of breath EXAM: PORTABLE CHEST 1 VIEW COMPARISON:  05/07/2021 FINDINGS: Single frontal view of the chest demonstrates stable right internal jugular catheter. Endotracheal tube is been removed. Cardiac silhouette is unremarkable. No airspace disease, effusion, or pneumothorax. Minimal linear opacities at the lung bases consistent with scarring or subsegmental atelectasis. No acute bony abnormalities. IMPRESSION: 1. Bibasilar hypoventilatory change.  No acute process. Electronically Signed   By: Randa Ngo M.D.   On: 05/10/2021 19:13    Cardiac Studies     2D Echocardiogram 01.23.2023    1. Left ventricular ejection fraction, by estimation, is 50 to 55%. The  left ventricle has low normal function. Left ventricular endocardial  border not optimally defined to evaluate regional wall motion. There is  mild left ventricular hypertrophy. Left  ventricular diastolic parameters are indeterminate.   2. Right ventricular systolic function is normal. The right ventricular  size is normal. Tricuspid regurgitation signal is inadequate for assessing  PA pressure.   3. Left atrial size was mildly dilated.   4. Right atrial size was mildly dilated.   5. The mitral valve is normal in structure. No evidence of mitral valve  regurgitation. No evidence of mitral stenosis.   6. The aortic valve is normal in structure. Aortic valve regurgitation is  not visualized. Aortic valve sclerosis/calcification is present, without  any evidence of aortic stenosis.   7. challenging image quality.    Assessment & Plan    Afib RVR    Septic shock s/p s. Pyogenes  Respiratory failure   AKI/ATN   Anemia   The patient's situation has become quite dire, she is  currently on 4 pressor agents.  Thankfully she is holding sinus rhythm but I suspect she will have recurrent atrial fibrillation.  Given the fact that she is able to hold sinus rhythm, if she were to develop recurrent atrial fibrillation and it were to be associated with hemodynamic instability, I would cardiovert her urgently.  Amiodarone intravenously can be up to a good sword associate with hypotension.  For now we will hold off.  Prognosis looks evermore grim    For questions or updates, please contact [place] Please consult [email or web address] for contact info under        Signed, Virl Axe, MD  05/11/2021, 11:59 AM

## 2021-05-11 NOTE — Consult Note (Signed)
[facility] Consult Note  MRN : [id number]  Alexandra Foster is a 71 y.o. (March 13, 1951) female who presents with chief complaint of  Chief Complaint  Patient presents with   Leg Pain  .  History of Present Illness: Patient is admitted to the hospital with Necrotizing Fascitis and has developed acute renal failure.  The patient is intubated and sedated. The patient is critically ill with Severe Sepsis. The nephrology service has decided to initiate dialysis at this time, and we are asked to place a temporary dialysis catheter for immediate dialysis use.    Current Facility-Administered Medications  Medication Dose Route Frequency Provider Last Rate Last Admin   0.9 %  sodium chloride infusion  250 mL Intravenous Continuous Bennie Pierini, MD   Stopped at 05/10/21 2242   albumin human 25 % solution 25 g  25 g Intravenous Q6H Rust-Chester, Britton L, NP 60 mL/hr at 05/11/21 0913 25 g at 05/11/21 8299   ascorbic acid (VITAMIN C) tablet 500 mg  500 mg Oral BID Flora Lipps, MD   500 mg at 05/10/21 1128   atorvastatin (LIPITOR) tablet 40 mg  40 mg Oral Daily Bennie Pierini, MD   40 mg at 05/10/21 1127   budesonide (PULMICORT) nebulizer solution 0.25 mg  0.25 mg Nebulization BID Rust-Chester, Huel Cote, NP   0.25 mg at 05/11/21 0755   Chlorhexidine Gluconate Cloth 2 % PADS 6 each  6 each Topical Q0600 Bennie Pierini, MD   6 each at 05/10/21 0644   dextrose 50 % solution 0-50 mL  0-50 mL Intravenous PRN Tyler Pita, MD       docusate (COLACE) 50 MG/5ML liquid 100 mg  100 mg Per Tube BID Lang Snow, NP   100 mg at 05/09/21 2142   docusate sodium (COLACE) capsule 100 mg  100 mg Oral BID PRN Lang Snow, NP       DOPamine (INTROPIN) 800 mg in dextrose 5 % 250 mL (3.2 mg/mL) infusion  0-20 mcg/kg/min Intravenous Titrated Rust-Chester, Britton L, NP 46.3 mL/hr at 05/11/21 1136 20 mcg/kg/min at 05/11/21 1136   EPINEPHrine  (ADRENALIN) 10 mg in sodium chloride 0.9 % 250 mL (0.04 mg/mL) infusion  0.5-20 mcg/min Intravenous Titrated Rust-Chester, Britton L, NP 30 mL/hr at 05/11/21 0912 20 mcg/min at 05/11/21 0912   feeding supplement (PIVOT 1.5 CAL) liquid 1,000 mL  1,000 mL Per Tube Continuous Tyler Pita, MD   Held at 05/10/21 2054   free water 30 mL  30 mL Per Tube Q4H Tyler Pita, MD       heparin injection 1,000-6,000 Units  1,000-6,000 Units CRRT PRN Liana Gerold, MD   1,800 Units at 05/11/21 1242   heparin injection 5,000 Units  5,000 Units Subcutaneous Q8H Flora Lipps, MD   5,000 Units at 05/11/21 0522   hydrocortisone sodium succinate (SOLU-CORTEF) 100 MG injection 100 mg  100 mg Intravenous Q8H Rust-Chester, Britton L, NP   100 mg at 05/11/21 0522   HYDROmorphone (DILAUDID) 50 mg in sodium chloride 0.9 % 100 mL (0.5 mg/mL) infusion  0.5-4 mg/hr Intravenous Continuous Rust-Chester, Britton L, NP   Stopped at 05/11/21 0345   HYDROmorphone (DILAUDID) bolus via infusion 0.25-1 mg  0.25-1 mg Intravenous Q30 min PRN Rust-Chester, Britton L, NP   0.25 mg at 05/10/21 2015   insulin regular, human (MYXREDLIN) 100 units/ 100 mL infusion   Intravenous Continuous Tyler Pita, MD 14 mL/hr at  05/11/21 1032 14 Units/hr at 05/11/21 1032   ipratropium-albuterol (DUONEB) 0.5-2.5 (3) MG/3ML nebulizer solution 3 mL  3 mL Nebulization Q6H Tyler Pita, MD   3 mL at 05/11/21 0755   linezolid (ZYVOX) IVPB 600 mg  600 mg Intravenous Q12H Tsosie Billing, MD 300 mL/hr at 05/11/21 1101 600 mg at 05/11/21 1101   lip balm (BLISTEX) ointment   Topical PRN Flora Lipps, MD       multivitamin with minerals tablet 1 tablet  1 tablet Per Tube Daily Rust-Chester, Toribio Harbour L, NP       norepinephrine (LEVOPHED) 16 mg in 237mL premix infusion  0-40 mcg/min Intravenous Titrated Flora Lipps, MD 37.5 mL/hr at 05/11/21 0907 40 mcg/min at 05/11/21 1157   nutrition supplement (JUVEN) (JUVEN) powder packet 1 packet   1 packet Per Tube BID BM Rust-Chester, Huel Cote, NP       ondansetron (ZOFRAN) injection 4 mg  4 mg Intravenous Q6H PRN Tyler Pita, MD   4 mg at 05/10/21 1201   oxyCODONE (Oxy IR/ROXICODONE) immediate release tablet 10 mg  10 mg Per Tube Q6H PRN Rust-Chester, Huel Cote, NP       pantoprazole (PROTONIX) injection 40 mg  40 mg Intravenous Q24H Rust-Chester, Britton L, NP   40 mg at 05/11/21 0522   penicillin G potassium 3 Million Units in dextrose 54mL IVPB  3 Million Units Intravenous Q4H Berton Mount, RPH 100 mL/hr at 05/11/21 1052 3 Million Units at 05/11/21 1052   polyethylene glycol (MIRALAX / GLYCOLAX) packet 17 g  17 g Oral Daily PRN Lang Snow, NP   17 g at 05/09/21 1012   polyethylene glycol (MIRALAX / GLYCOLAX) packet 17 g  17 g Per Tube Daily Lang Snow, NP   17 g at 05/09/21 1038   prismasol BGK 2/2.5 dialysis solution   CRRT Continuous Bhutani, Manpreet S, MD       sodium bicarbonate 150 mEq in sterile water 1,150 mL infusion   Intravenous Continuous Rust-Chester, Britton L, NP 125 mL/hr at 05/11/21 0600 Infusion Verify at 05/11/21 0600   sodium chloride 0.9 % primer fluid for CRRT   CRRT PRN Bhutani, Manpreet S, MD       sodium chloride flush (NS) 0.9 % injection 10-40 mL  10-40 mL Intracatheter Q12H Kathlyn Sacramento A, MD   10 mL at 05/11/21 0915   sodium chloride flush (NS) 0.9 % injection 10-40 mL  10-40 mL Intracatheter PRN Wellington Hampshire, MD       sodium zirconium cyclosilicate (LOKELMA) packet 10 g  10 g Per Tube TID Rust-Chester, Toribio Harbour L, NP   10 g at 05/10/21 2255   vasopressin (PITRESSIN) 20 Units in sodium chloride 0.9 % 100 mL infusion-*FOR SHOCK*  0-0.04 Units/min Intravenous Continuous Rust-Chester, Britton L, NP 12 mL/hr at 05/11/21 0908 0.04 Units/min at 05/11/21 0908   zinc sulfate capsule 220 mg  220 mg Per Tube Daily Rust-Chester, Huel Cote, NP        Past Medical History:  Diagnosis Date   AKI (acute kidney injury) ([place])     a. 04/2021 in setting of bacteremia/shock.   Arthritis    Asthma    Bacteremia    a. 04/2021 S pyogenes bacteremia in setting of lower ext cellulitis.   COPD (chronic obstructive pulmonary disease) (H[facility])    Diabetes mellitus without complication ([facility])    Endometriosis    GERD (gastroesophageal reflux disease)    History of echocardiogram  a. 07/2013 Echo: EF 55-60%, impaired relaxation, mild TR; b. 04/2021 Echo: EF 50-55%, mild LVH, nl RV fxn, mild BAE, Ao sclerosis w/o stenosis.   Hypertension    Obesity    PAF (paroxysmal atrial fibrillation) ([place])    a. 04/2021 in setting of septic shock/cellulitis.   Sleep apnea    CPAP    Past Surgical History:  Procedure Laterality Date   ABDOMINAL HYSTERECTOMY     COLONOSCOPY  10/29/2006   Dr Allen Norris   COLONOSCOPY WITH PROPOFOL N/A 11/05/2016   Procedure: COLONOSCOPY WITH PROPOFOL;  Surgeon: Robert Bellow, MD;  Location: [facility] ENDOSCOPY;  Service: Endoscopy;  Laterality: N/A;   NASAL SINUS SURGERY  2002   Dr Carlis Abbott   WOUND DEBRIDEMENT Left 05/07/2021   Procedure: DEBRIDEMENT WOUND;  Surgeon: Herbert Pun, MD;  Location: [facility] ORS;  Service: General;  Laterality: Left;    Social History Social History   Tobacco Use   Smoking status: Former    Packs/day: 2.00    Years: 40.00    Pack years: 80.00    Types: Cigarettes    Quit date: 2003    Years since quitting: 20.0   Smokeless tobacco: Former    Types: Snuff    Quit date: 04/2001   Tobacco comments:    smoking cessation materials not required  Vaping Use   Vaping Use: Never used  Substance Use Topics   Alcohol use: No    Alcohol/week: 0.0 standard drinks   Drug use: No    Family History Family History  Problem Relation Age of Onset   Congestive Heart Failure Mother    Coronary artery disease Father 97   Breast cancer Sister 70   Heart disease Brother    Varicose Veins Brother    Alcohol abuse Brother     No Known Allergies   REVIEW OF SYSTEMS Unable to  obtain. PT Intubated sedated  Constitutional: [] Weight loss  [] Fever  [] Chills Cardiac: [] Chest pain   [] Chest pressure   [] Palpitations   [] Shortness of breath when laying flat   [] Shortness of breath at rest   [] Shortness of breath with exertion. Vascular:  [] Pain in legs with walking   [] Pain in legs at rest   [] Pain in legs when laying flat   [] Claudication   [] Pain in feet when walking  [] Pain in feet at rest  [] Pain in feet when laying flat   [] History of DVT   [] Phlebitis   [] Swelling in legs   [] Varicose veins   [] Non-healing ulcers Pulmonary:   [] Uses home oxygen   [] Productive cough   [] Hemoptysis   [] Wheeze  [] COPD   [] Asthma Neurologic:  [] Dizziness  [] Blackouts   [] Seizures   [] History of stroke   [] History of TIA  [] Aphasia   [] Temporary blindness   [] Dysphagia   [] Weakness or numbness in arms   [] Weakness or numbness in legs Musculoskeletal:  [] Arthritis   [] Joint swelling   [] Joint pain   [] Low back pain Hematologic:  [] Easy bruising  [] Easy bleeding   [] Hypercoagulable state   [] Anemic  [] Hepatitis Gastrointestinal:  [] Blood in stool   [] Vomiting blood  [] Gastroesophageal reflux/heartburn   [] Difficulty swallowing. Genitourinary:  [] Chronic kidney disease   [] Difficult urination  [] Frequent urination  [] Burning with urination   [] Blood in urine Skin:  [] Rashes   [] Ulcers   [] Wounds Psychological:  [] History of anxiety   []  History of major depression.  Unable to obtain the review of systems due to the patient's severe systemic illness and  altered mental status.       Physical Examination  Vitals:   05/11/21 0800 05/11/21 0830 05/11/21 0900 05/11/21 0930  BP: (!) 115/51 123/60 128/64 (!) 141/47  Pulse:      Resp: 20 18 20 20   Temp: 99.1 F (37.3 C) 99.3 F (37.4 C) 99.7 F (37.6 C) 99.9 F (37.7 C)  TempSrc: Esophageal Esophageal Esophageal Esophageal  SpO2: 93% 93% 93% 93%  Weight:      Height:       Body mass index is 47.07 kg/m. Gen: Intubated sedated Neck:  Supple, no nuchal rigidity.  No JVD.  Pulmonary:  Good air movement, clear to auscultation bilaterally.  Cardiac: RRR, normal S1, S2, no Murmurs, rubs or gallops. Gastrointestinal: soft, non-tender/non-distended. No guarding/reflex.  Musculoskeletal: M/S 5/5 throughout.  Extremities without ischemic changes.  No deformity or atrophy. 2+ Edema in the lower extremities bilaterally Psychiatric: Difficult to assess due to the severity of patient's illness. Dermatologic: No rashes or ulcers noted.   Lymph : No Cervical, Axillary, or Inguinal lymphadenopathy.     CBC Lab Results  Component Value Date   WBC 42.8 (H) 05/11/2021   HGB 10.8 (L) 05/11/2021   HCT 33.2 (L) 05/11/2021   MCV 88.3 05/11/2021   PLT 219 05/11/2021    BMET    Component Value Date/Time   NA 137 05/11/2021 0531   NA 138 08/11/2013 0933   K 5.4 (H) 05/11/2021 0531   K 3.3 (L) 08/11/2013 0933   CL 98 05/11/2021 0531   CL 102 08/11/2013 0933   CO2 15 (L) 05/11/2021 0531   CO2 32 08/11/2013 0933   GLUCOSE 368 (H) 05/11/2021 0531   GLUCOSE 107 (H) 08/11/2013 0933   BUN 92 (H) 05/11/2021 0531   BUN 13 08/11/2013 0933   CREATININE 4.21 (H) 05/11/2021 0531   CREATININE 0.98 02/11/2021 0941   CALCIUM 6.8 (L) 05/11/2021 0531   CALCIUM 9.2 08/11/2013 0933   GFRNONAA 11 (L) 05/11/2021 0531   GFRNONAA 61 07/14/2019 0940   GFRAA 71 07/14/2019 0940   Estimated Creatinine Clearance: 16.3 mL/min (A) (by C-G formula based on SCr of 4.21 mg/dL (H)).  COAG Lab Results  Component Value Date   INR 1.3 (H) 05/05/2021    Radiology DG Chest 1 View  Result Date: 05/07/2021 CLINICAL DATA:  Endotracheally intubated. EXAM: CHEST  1 VIEW COMPARISON:  Chest radiograph earlier today. FINDINGS: Endotracheal tube tip is 4 cm from the carina at the level of the clavicular heads. Right internal jugular central line tip in the region of the atrial caval junction. Cardiomegaly again seen. No pneumothorax. Lung volumes remain low  without confluent consolidation. No significant pleural effusion. IMPRESSION: 1. Endotracheal tube tip 4 cm from the carina at the level of the clavicular heads. 2. Right central line tip at the atrial caval junction. 3. Stable cardiomegaly. Low lung volumes. Electronically Signed   By: Keith Rake M.D.   On: 05/07/2021 23:56   DG Abd 1 View  Result Date: 05/11/2021 CLINICAL DATA:  NG tube placement. EXAM: ABDOMEN - 1 VIEW COMPARISON:  None. FINDINGS: Nasal/orogastric tube passes below the diaphragm, well into the stomach, tip below the included field of view. Endotracheal tube tip projects 3 cm above the carina. IMPRESSION: 1. Well-positioned nasal/orogastric tube. Electronically Signed   By: Lajean Manes M.D.   On: 05/11/2021 11:33   CT Tibia Fibula Left Wo Contrast  Result Date: 05/04/2021 CLINICAL DATA:  Concern for soft tissue infection. EXAM: CT OF  THE LOWER LEFT EXTREMITY WITHOUT CONTRAST TECHNIQUE: Multidetector CT imaging of the lower left extremity was performed according to the standard protocol. RADIATION DOSE REDUCTION: This exam was performed according to the departmental dose-optimization program which includes automated exposure control, adjustment of the mA and/or kV according to patient size and/or use of iterative reconstruction technique. COMPARISON:  None. FINDINGS: Bones/Joint/Cartilage No acute fracture or dislocation. There is moderate arthritic changes of the knee with tricompartmental narrowing. Ligaments Suboptimally assessed by CT. Muscles and Tendons No acute findings. Soft tissues Diffuse subcutaneous edema may represent cellulitis. No drainable fluid collection/abscess. No soft tissue gas. IMPRESSION: Diffuse subcutaneous edema may represent cellulitis. No drainable fluid collection/abscess. Electronically Signed   By: Anner Crete M.D.   On: 05/04/2021 22:22   US Venous Img Lower Unilateral Left  Result Date: 05/04/2021 CLINICAL DATA:  Left lower extremity pain.  EXAM: Left LOWER EXTREMITY VENOUS DOPPLER ULTRASOUND TECHNIQUE: Gray-scale sonography with compression, as well as color and duplex ultrasound, were performed to evaluate the deep venous system(s) from the level of the common femoral vein through the popliteal and proximal calf veins. COMPARISON:  None. FINDINGS: VENOUS Normal compressibility of the common femoral, superficial femoral, and popliteal veins, as well as the visualized calf veins. Visualized portions of profunda femoral vein and great saphenous vein unremarkable. No filling defects to suggest DVT on grayscale or color Doppler imaging. Doppler waveforms show normal direction of venous flow, normal respiratory plasticity and response to augmentation. Limited views of the contralateral common femoral vein are unremarkable. OTHER None. Limitations: none IMPRESSION: Negative. Electronically Signed   By: Anner Crete M.D.   On: 05/04/2021 23:39   DG Chest Port 1 View  Result Date: 05/10/2021 CLINICAL DATA:  Post intubation, OG tube EXAM: PORTABLE CHEST 1 VIEW COMPARISON:  05/10/2021 at 1906 hours FINDINGS: Lungs are essentially clear.  No pleural effusion or pneumothorax. Endotracheal tube terminates 3 cm above the carina. Enteric tube courses into the stomach. Right IJ venous catheter terminates at the cavoatrial junction. Defibrillator pads overlying the chest. IMPRESSION: Endotracheal tube terminates 3 cm above the carina. Enteric tube courses into the stomach. Electronically Signed   By: Julian Hy M.D.   On: 05/10/2021 21:29   DG Chest Port 1 View  Result Date: 05/10/2021 CLINICAL DATA:  Short of breath EXAM: PORTABLE CHEST 1 VIEW COMPARISON:  05/07/2021 FINDINGS: Single frontal view of the chest demonstrates stable right internal jugular catheter. Endotracheal tube is been removed. Cardiac silhouette is unremarkable. No airspace disease, effusion, or pneumothorax. Minimal linear opacities at the lung bases consistent with scarring or  subsegmental atelectasis. No acute bony abnormalities. IMPRESSION: 1. Bibasilar hypoventilatory change.  No acute process. Electronically Signed   By: Randa Ngo M.D.   On: 05/10/2021 19:13   DG Chest Port 1 View  Result Date: 05/07/2021 CLINICAL DATA:  Central line placement. EXAM: PORTABLE CHEST 1 VIEW COMPARISON:  05/04/2021 FINDINGS: A new right jugular central venous catheter seen with tip overlying the mid right atrium. No evidence of pneumothorax. Moderate cardiomegaly is again noted. Patient is partially rotated to the right. Low lung volumes are again seen, however there is no evidence of pulmonary consolidation or pleural effusion. IMPRESSION: New right jugular central venous catheter tip overlies the mid right atrium. No evidence of pneumothorax. Moderate cardiomegaly and low lung volumes. Electronically Signed   By: Marlaine Hind M.D.   On: 05/07/2021 20:18   DG Chest Port 1 View  Result Date: 05/04/2021 CLINICAL DATA:  Questionable sepsis.  EXAM: PORTABLE CHEST 1 VIEW COMPARISON:  05/17/2019 FINDINGS: Cardiomegaly. No confluent airspace opacity, effusions or edema. No acute bony abnormality. IMPRESSION: No active disease. Electronically Signed   By: Rolm Baptise M.D.   On: 05/04/2021 21:03   ECHOCARDIOGRAM COMPLETE BUBBLE STUDY  Result Date: 05/06/2021    ECHOCARDIOGRAM REPORT   Patient Name:   [facility] Date of Exam: 05/06/2021 Medical Rec #:  [id number]           Height:       65.0 in Accession #:    [id number]          Weight:       272.0 lb Date of Birth:  06-01-1950           BSA:          2.254 m Patient Age:    16 years            BP:           93/69 mmHg Patient Gender: F                   HR:           139 bpm. Exam Location:  [facility] Procedure: 2D Echo, Cardiac Doppler, Color Doppler and Saline Contrast Bubble            Study Indications:     Bacteremia 790.7  History:         Patient has no prior history of Echocardiogram examinations.                  COPD; Risk  Factors:Hypertension and Diabetes.  Sonographer:     Sherrie Sport Referring Phys:  [id number] ADAM ROSS SCHERTZ Diagnosing Phys: Kathlyn Sacramento MD  Sonographer Comments: Suboptimal parasternal window and suboptimal apical window. Image acquisition challenging due to COPD. IMPRESSIONS  1. Left ventricular ejection fraction, by estimation, is 50 to 55%. The left ventricle has low normal function. Left ventricular endocardial border not optimally defined to evaluate regional wall motion. There is mild left ventricular hypertrophy. Left ventricular diastolic parameters are indeterminate.  2. Right ventricular systolic function is normal. The right ventricular size is normal. Tricuspid regurgitation signal is inadequate for assessing PA pressure.  3. Left atrial size was mildly dilated.  4. Right atrial size was mildly dilated.  5. The mitral valve is normal in structure. No evidence of mitral valve regurgitation. No evidence of mitral stenosis.  6. The aortic valve is normal in structure. Aortic valve regurgitation is not visualized. Aortic valve sclerosis/calcification is present, without any evidence of aortic stenosis.  7. challenging image quality. FINDINGS  Left Ventricle: Left ventricular ejection fraction, by estimation, is 50 to 55%. The left ventricle has low normal function. Left ventricular endocardial border not optimally defined to evaluate regional wall motion. The left ventricular internal cavity  size was normal in size. There is mild left ventricular hypertrophy. Left ventricular diastolic parameters are indeterminate. Right Ventricle: The right ventricular size is normal. No increase in right ventricular wall thickness. Right ventricular systolic function is normal. Tricuspid regurgitation signal is inadequate for assessing PA pressure. Left Atrium: Left atrial size was mildly dilated. Right Atrium: Right atrial size was mildly dilated. Pericardium: There is no evidence of pericardial effusion. Mitral  Valve: The mitral valve is normal in structure. No evidence of mitral valve regurgitation. No evidence of mitral valve stenosis. Tricuspid Valve: The tricuspid valve is normal in structure. Tricuspid valve regurgitation is not demonstrated. No  evidence of tricuspid stenosis. Aortic Valve: The aortic valve is normal in structure. Aortic valve regurgitation is not visualized. Aortic valve sclerosis/calcification is present, without any evidence of aortic stenosis. Aortic valve mean gradient measures 5.0 mmHg. Aortic valve peak  gradient measures 8.9 mmHg. Aortic valve area, by VTI measures 2.75 cm. Pulmonic Valve: The pulmonic valve was normal in structure. Pulmonic valve regurgitation is not visualized. No evidence of pulmonic stenosis. Aorta: The aortic root is normal in size and structure. Venous: The inferior vena cava was not well visualized. IAS/Shunts: No atrial level shunt detected by color flow Doppler. Agitated saline contrast was given intravenously to evaluate for intracardiac shunting.  LEFT VENTRICLE PLAX 2D LVIDd:         2.80 cm LVIDs:         2.10 cm LV PW:         1.10 cm LV IVS:        1.05 cm LVOT diam:     2.00 cm LV SV:         45 LV SV Index:   20 LVOT Area:     3.14 cm  RIGHT VENTRICLE RV Basal diam:  4.00 cm RV S prime:     18.70 cm/s LEFT ATRIUM             Index        RIGHT ATRIUM           Index LA diam:        3.00 cm 1.33 cm/m   RA Area:     20.20 cm LA Vol (A2C):   56.4 ml 25.02 ml/m  RA Volume:   61.90 ml  27.46 ml/m LA Vol (A4C):   51.0 ml 22.62 ml/m LA Biplane Vol: 53.5 ml 23.73 ml/m  AORTIC VALVE AV Area (Vmax):    2.40 cm AV Area (Vmean):   2.31 cm AV Area (VTI):     2.75 cm AV Vmax:           149.00 cm/s AV Vmean:          107.000 cm/s AV VTI:            0.164 m AV Peak Grad:      8.9 mmHg AV Mean Grad:      5.0 mmHg LVOT Vmax:         114.00 cm/s LVOT Vmean:        78.700 cm/s LVOT VTI:          0.144 m LVOT/AV VTI ratio: 0.88  AORTA Ao Root diam: 2.83 cm MITRAL VALVE                 TRICUSPID VALVE MV Area (PHT): 6.07 cm     TR Peak grad:   13.4 mmHg MV Decel Time: 125 msec     TR Vmax:        183.00 cm/s MV E velocity: 111.00 cm/s                             SHUNTS                             Systemic VTI:  0.14 m                             Systemic Diam: 2.00 cm Kathlyn Sacramento  MD Electronically signed by Kathlyn Sacramento MD Signature Date/Time: 05/06/2021/1:25:12 PM    Final       Assessment/Plan 1. ARF 2. Sepsis   We will proceed with temporary dialysis catheter placement at this time.  Risks and benefits discussed with patient and/or family, and the catheter will be placed to allow immediate initiation of dialysis.  If the patient's renal function does not improve throughout the hospital course, we will be happy to place a tunneled dialysis catheter for long term use prior to discharge.     Evaristo Bury, MD  05/11/2021 12:46 PM

## 2021-05-11 NOTE — Consult Note (Signed)
PHARMACY CONSULT NOTE - FOLLOW UP  Pharmacy Consult for Electrolyte Monitoring and Replacement   Recent Labs: Potassium (mmol/L)  Date Value  05/11/2021 5.4 (H)  08/11/2013 3.3 (L)   Magnesium (mg/dL)  Date Value  05/11/2021 2.9 (H)   Calcium (mg/dL)  Date Value  05/11/2021 6.8 (L)   Calcium, Total (mg/dL)  Date Value  08/11/2013 9.2   Albumin (g/dL)  Date Value  05/11/2021 <1.5 (L)  08/11/2013 3.1 (L)   Phosphorus (mg/dL)  Date Value  05/11/2021 14.2 (H)   Sodium (mmol/L)  Date Value  05/11/2021 137  08/11/2013 138     Assessment: Pharmacy has been consulted to monitor and replace electrolytes in 71 yo patient with severe sepsis and strep pyogenes bacteremia.    Goal of Therapy:  Electrolytes WNL  Plan:  --No replacement indicated at this time --Continue to follow along  Alexandra Foster, PharmD, BCPS Clinical Pharmacist 05/11/2021 9:01 AM

## 2021-05-12 DIAGNOSIS — G9340 Encephalopathy, unspecified: Secondary | ICD-10-CM

## 2021-05-12 DIAGNOSIS — Z9911 Dependence on respirator [ventilator] status: Secondary | ICD-10-CM

## 2021-05-12 DIAGNOSIS — G253 Myoclonus: Secondary | ICD-10-CM | POA: Diagnosis not present

## 2021-05-12 DIAGNOSIS — N179 Acute kidney failure, unspecified: Secondary | ICD-10-CM

## 2021-05-12 DIAGNOSIS — E119 Type 2 diabetes mellitus without complications: Secondary | ICD-10-CM | POA: Diagnosis not present

## 2021-05-12 DIAGNOSIS — K7200 Acute and subacute hepatic failure without coma: Secondary | ICD-10-CM

## 2021-05-12 DIAGNOSIS — A483 Toxic shock syndrome: Secondary | ICD-10-CM

## 2021-05-12 DIAGNOSIS — E872 Acidosis, unspecified: Secondary | ICD-10-CM

## 2021-05-12 DIAGNOSIS — R401 Stupor: Secondary | ICD-10-CM | POA: Diagnosis not present

## 2021-05-12 DIAGNOSIS — A419 Sepsis, unspecified organism: Secondary | ICD-10-CM | POA: Diagnosis not present

## 2021-05-12 DIAGNOSIS — J9601 Acute respiratory failure with hypoxia: Secondary | ICD-10-CM

## 2021-05-12 DIAGNOSIS — R6521 Severe sepsis with septic shock: Secondary | ICD-10-CM | POA: Diagnosis not present

## 2021-05-12 DIAGNOSIS — D62 Acute posthemorrhagic anemia: Secondary | ICD-10-CM

## 2021-05-12 DIAGNOSIS — D649 Anemia, unspecified: Secondary | ICD-10-CM | POA: Diagnosis not present

## 2021-05-12 DIAGNOSIS — G931 Anoxic brain damage, not elsewhere classified: Secondary | ICD-10-CM | POA: Diagnosis not present

## 2021-05-12 DIAGNOSIS — M726 Necrotizing fasciitis: Secondary | ICD-10-CM

## 2021-05-12 DIAGNOSIS — A400 Sepsis due to streptococcus, group A: Secondary | ICD-10-CM | POA: Diagnosis not present

## 2021-05-12 DIAGNOSIS — J9620 Acute and chronic respiratory failure, unspecified whether with hypoxia or hypercapnia: Secondary | ICD-10-CM

## 2021-05-12 DIAGNOSIS — B950 Streptococcus, group A, as the cause of diseases classified elsewhere: Secondary | ICD-10-CM

## 2021-05-12 DIAGNOSIS — L03116 Cellulitis of left lower limb: Secondary | ICD-10-CM | POA: Diagnosis not present

## 2021-05-12 DIAGNOSIS — B955 Unspecified streptococcus as the cause of diseases classified elsewhere: Secondary | ICD-10-CM

## 2021-05-12 LAB — GLUCOSE, CAPILLARY
Glucose-Capillary: 160 mg/dL — ABNORMAL HIGH (ref 70–99)
Glucose-Capillary: 158 mg/dL — ABNORMAL HIGH (ref 70–99)
Glucose-Capillary: 213 mg/dL — ABNORMAL HIGH (ref 70–99)
Glucose-Capillary: 152 mg/dL — ABNORMAL HIGH (ref 70–99)
Glucose-Capillary: 156 mg/dL — ABNORMAL HIGH (ref 70–99)
Glucose-Capillary: 176 mg/dL — ABNORMAL HIGH (ref 70–99)

## 2021-05-12 LAB — RENAL FUNCTION PANEL
Albumin: 2 g/dL — ABNORMAL LOW (ref 3.5–5.0)
Albumin: 2.7 g/dL — ABNORMAL LOW (ref 3.5–5.0)
Anion gap: 9 (ref 5–15)
Anion gap: 12 (ref 5–15)
BUN: 53 mg/dL — ABNORMAL HIGH (ref 8–23)
BUN: 44 mg/dL — ABNORMAL HIGH (ref 8–23)
CO2: 25 mmol/L (ref 22–32)
CO2: 26 mmol/L (ref 22–32)
Calcium: 6.3 mg/dL — CL (ref 8.9–10.3)
Calcium: 7.8 mg/dL — ABNORMAL LOW (ref 8.9–10.3)
Chloride: 98 mmol/L (ref 98–111)
Chloride: 94 mmol/L — ABNORMAL LOW (ref 98–111)
Creatinine, Ser: 2.38 mg/dL — ABNORMAL HIGH (ref 0.44–1.00)
Creatinine, Ser: 2.04 mg/dL — ABNORMAL HIGH (ref 0.44–1.00)
GFR, Estimated: 21 mL/min — ABNORMAL LOW (ref >60)
GFR, Estimated: 25 mL/min — ABNORMAL LOW (ref >60)
Glucose, Bld: 143 mg/dL — ABNORMAL HIGH (ref 70–99)
Glucose, Bld: 162 mg/dL — ABNORMAL HIGH (ref 70–99)
Phosphorus: 6.1 mg/dL — ABNORMAL HIGH (ref 2.5–4.6)
Phosphorus: 4.9 mg/dL — ABNORMAL HIGH (ref 2.5–4.6)
Potassium: 4.1 mmol/L (ref 3.5–5.1)
Potassium: 4.4 mmol/L (ref 3.5–5.1)
Sodium: 132 mmol/L — ABNORMAL LOW (ref 135–145)
Sodium: 132 mmol/L — ABNORMAL LOW (ref 135–145)

## 2021-05-12 LAB — COMPREHENSIVE METABOLIC PANEL
ALT: 1711 U/L — ABNORMAL HIGH (ref 0–44)
AST: 6250 U/L — ABNORMAL HIGH (ref 15–41)
Albumin: 2.5 g/dL — ABNORMAL LOW (ref 3.5–5.0)
Alkaline Phosphatase: 262 U/L — ABNORMAL HIGH (ref 38–126)
Anion gap: 11 (ref 5–15)
BUN: 50 mg/dL — ABNORMAL HIGH (ref 8–23)
CO2: 27 mmol/L (ref 22–32)
Calcium: 7.6 mg/dL — ABNORMAL LOW (ref 8.9–10.3)
Chloride: 94 mmol/L — ABNORMAL LOW (ref 98–111)
Creatinine, Ser: 2.29 mg/dL — ABNORMAL HIGH (ref 0.44–1.00)
GFR, Estimated: 22 mL/min — ABNORMAL LOW (ref >60)
Glucose, Bld: 155 mg/dL — ABNORMAL HIGH (ref 70–99)
Potassium: 4.4 mmol/L (ref 3.5–5.1)
Sodium: 132 mmol/L — ABNORMAL LOW (ref 135–145)
Total Bilirubin: 3.9 mg/dL — ABNORMAL HIGH (ref 0.3–1.2)
Total Protein: 6.3 g/dL — ABNORMAL LOW (ref 6.5–8.1)

## 2021-05-12 LAB — HEPATIC FUNCTION PANEL
ALT: 1613 U/L — ABNORMAL HIGH (ref 0–44)
AST: 6519 U/L — ABNORMAL HIGH (ref 15–41)
Albumin: 2.1 g/dL — ABNORMAL LOW (ref 3.5–5.0)
Alkaline Phosphatase: 232 U/L — ABNORMAL HIGH (ref 38–126)
Bilirubin, Direct: 2 mg/dL — ABNORMAL HIGH (ref 0.0–0.2)
Indirect Bilirubin: 1 mg/dL — ABNORMAL HIGH (ref 0.3–0.9)
Total Bilirubin: 3 mg/dL — ABNORMAL HIGH (ref 0.3–1.2)
Total Protein: 5.3 g/dL — ABNORMAL LOW (ref 6.5–8.1)

## 2021-05-12 LAB — CBC
HCT: 24.5 % — ABNORMAL LOW (ref 36.0–46.0)
Hemoglobin: 8.2 g/dL — ABNORMAL LOW (ref 12.0–15.0)
MCH: 28.7 pg (ref 26.0–34.0)
MCHC: 33.5 g/dL (ref 30.0–36.0)
MCV: 85.7 fL (ref 80.0–100.0)
Platelets: 187 10*3/uL (ref 150–400)
RBC: 2.86 MIL/uL — ABNORMAL LOW (ref 3.87–5.11)
RDW: 19.2 % — ABNORMAL HIGH (ref 11.5–15.5)
WBC: 40.8 10*3/uL — ABNORMAL HIGH (ref 4.0–10.5)
nRBC: 13.7 % — ABNORMAL HIGH (ref 0.0–0.2)

## 2021-05-12 LAB — PHOSPHORUS: Phosphorus: 5.8 mg/dL — ABNORMAL HIGH (ref 2.5–4.6)

## 2021-05-12 LAB — MAGNESIUM: Magnesium: 2.1 mg/dL (ref 1.7–2.4)

## 2021-05-12 LAB — CK: Total CK: 4670 U/L — ABNORMAL HIGH (ref 38–234)

## 2021-05-12 MED ORDER — CALCIUM GLUCONATE-NACL 2-0.675 GM/100ML-% IV SOLN
2.0000 g | Freq: Once | INTRAVENOUS | Status: AC
  Administered 2021-05-12: 2000 mg via INTRAVENOUS
  Filled 2021-05-12: qty 100

## 2021-05-12 MED ORDER — SENNOSIDES 8.8 MG/5ML PO SYRP
10.0000 mL | ORAL_SOLUTION | Freq: Every evening | ORAL | Status: DC | PRN
  Administered 2021-05-14: 10 mL
  Filled 2021-05-12 (×2): qty 10

## 2021-05-12 MED ORDER — POLYETHYLENE GLYCOL 3350 17 G PO PACK
17.0000 g | PACK | Freq: Every day | ORAL | Status: DC
  Administered 2021-05-13 – 2021-05-19 (×5): 17 g
  Filled 2021-05-12 (×6): qty 1

## 2021-05-12 NOTE — Progress Notes (Signed)
Alexandra Foster  MRN: [id number]  DOB/AGE: 1950-08-07 71 y.o.  Primary Care Physician:Sowles, [name], MD  Admit date: 05/04/2021  Chief Complaint:  Chief Complaint  Patient presents with   Leg Pain    S-Pt presented on  05/04/2021 with  Chief Complaint  Patient presents with   Leg Pain  . 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 Patient was diagnosed with septic shock and admitted to ICU Patient had acute kidney injury and nephrology was consulted Patient was evaluated yesterday and was found to have acidosis, hyperphosphatemia, hyperkalemia was on vasopressors patient was started on CRRT Patient was seen again today in ICU Patient remains intubated unable to offer any complaints   Medications  vitamin C  500 mg Oral BID   budesonide (PULMICORT) nebulizer solution  0.25 mg Nebulization BID   chlorhexidine gluconate (MEDLINE KIT)  15 mL Mouth Rinse BID   Chlorhexidine Gluconate Cloth  6 each Topical Q0600   docusate  100 mg Per Tube BID   free water  30 mL Per Tube Q4H   heparin injection (subcutaneous)  5,000 Units Subcutaneous Q8H   hydrocortisone sod succinate (SOLU-CORTEF) inj  100 mg Intravenous Q8H   insulin aspart  1-3 Units Subcutaneous Q4H   insulin detemir  38 Units Subcutaneous BID   ipratropium-albuterol  3 mL Nebulization Q6H   mouth rinse  15 mL Mouth Rinse 10 times per day   multivitamin with minerals  1 tablet Per Tube Daily   nutrition supplement (JUVEN)  1 packet Per Tube BID BM   pantoprazole (PROTONIX) IV  40 mg Intravenous Q24H   polyethylene glycol  17 g Per Tube Daily   sodium chloride flush  10-40 mL Intracatheter Q12H   zinc sulfate  220 mg Per Tube Daily         ROS: Unable to get any data  Physical  Exam: Vital signs in last 24 hours: Temp:  [96.1 F (35.6 C)-100.8 F (38.2 C)] 98.2 F (36.8 C) (01/29 0800) Pulse Rate:  [92-107] 92 (01/28 2100) Resp:  [16-24] 20 (01/29 0800) BP: (98-149)/(24-83) 138/43 (01/29 0800) SpO2:  [93 %-100 %] 98 % (01/29 0800) FiO2 (%):  [70 %] 70 % (01/29 0800) Weight:  [130.4 kg] 130.4 kg (01/29 0500) Weight change: 2.1 kg Last BM Date: 05/10/21  Intake/Output from previous day: 01/28 0701 - 01/29 0700 In: 8031.3 [I.V.:6385.9; IV Piggyback:1595.3] Out: 4495 [Urine:90; Drains:15] Total I/O In: 259.5 [I.V.:186.2; IV Piggyback:73.3] Out: 255 [Urine:15; Drains:30; Other:210]   Physical Exam:  General- Patient is critically ill appearing,   Resp- Intubated rhonchi present bilaterally, breath sounds present bilaterally   CVS- S1S2 irregular in rate and rhythm   GIT- BS+, soft, NT, ND   EXT- 2+ LE Edema,  Left leg with negative pressure dressing Wound VAC in situ    Psych- Unable to assess  Access- Temporary catheter was placed  Lab Results:  CBC  Recent Labs    05/11/21 0040 05/11/21 0531 05/11/21 1239 05/11/21 2052  WBC 42.7* 42.8*  --   --   HGB 9.7* 10.8* 9.9* 9.9*  HCT 32.2* 33.2* 29.8* 30.1*  PLT 208 219  --   --     BMET  Recent Labs    05/11/21 2052 05/12/21 0431  NA  136 132*  K 4.6 4.1  CL 95* 98  CO2 25 25  GLUCOSE 123* 143*  BUN 72* 53*  CREATININE 3.28* 2.38*  CALCIUM 6.7* 6.3*      Most recent Creatinine trend  Lab Results  Component Value Date   CREATININE 2.38 (H) 05/12/2021   CREATININE 3.28 (H) 05/11/2021   CREATININE 4.00 (H) 05/11/2021      MICRO   Recent Results (from the past 240 hour(s))  Blood Culture (routine x 2)     Status: Abnormal   Collection Time: 05/04/21  7:43 PM   Specimen: BLOOD  Result Value Ref Range Status   Specimen Description   Final    BLOOD RIGHT ANTECUBITAL Performed at [facility], [address], [place], [place] [address]    Special Requests    Final    BOTTLES DRAWN AEROBIC AND ANAEROBIC Blood Culture adequate volume Performed at [facility], [address] [place], [place] [address]    Culture  Setup Time   Final    GRAM POSITIVE COCCI IN BOTH AEROBIC AND ANAEROBIC BOTTLES RESULT CALLED TO, READ BACK BY AND VERIFIED WITH: Delight Stare 05/05/21 @ 1007 BY SB    Culture (A)  Final    GROUP A STREP (S.PYOGENES) ISOLATED HEALTH DEPARTMENT NOTIFIED Performed at [facility], [address], [place], [place] [address]    Report Status 05/07/2021 FINAL  Final   Organism ID, Bacteria GROUP A STREP (S.PYOGENES) ISOLATED  Final      Susceptibility   Group a strep (s.pyogenes) isolated - MIC*    PENICILLIN <=0.06 SENSITIVE Sensitive     CEFTRIAXONE <=0.12 SENSITIVE Sensitive     ERYTHROMYCIN <=0.12 SENSITIVE Sensitive     LEVOFLOXACIN 1 SENSITIVE Sensitive     VANCOMYCIN 0.5 SENSITIVE Sensitive     * GROUP A STREP (S.PYOGENES) ISOLATED  Blood Culture ID Panel (Reflexed)     Status: Abnormal   Collection Time: 05/04/21  7:43 PM  Result Value Ref Range Status   Enterococcus faecalis NOT DETECTED NOT DETECTED Final   Enterococcus Faecium NOT DETECTED NOT DETECTED Final   Listeria monocytogenes NOT DETECTED NOT DETECTED Final   Staphylococcus species NOT DETECTED NOT DETECTED Final   Staphylococcus aureus (BCID) NOT DETECTED NOT DETECTED Final   Staphylococcus epidermidis NOT DETECTED NOT DETECTED Final   Staphylococcus lugdunensis NOT DETECTED NOT DETECTED Final   Streptococcus species DETECTED (A) NOT DETECTED Final    Comment: RESULT CALLED TO, READ BACK BY AND VERIFIED WITH: Delight Stare 05/05/21 @ 1007 BY SB    Streptococcus agalactiae NOT DETECTED NOT DETECTED Final   Streptococcus pneumoniae NOT DETECTED NOT DETECTED Final   Streptococcus pyogenes DETECTED (A) NOT DETECTED Final    Comment: RESULT CALLED TO, READ BACK BY AND VERIFIED WITH: Delight Stare 05/05/21 @ 1007 BY SB     A.calcoaceticus-baumannii NOT DETECTED NOT DETECTED Final   Bacteroides fragilis NOT DETECTED NOT DETECTED Final   Enterobacterales NOT DETECTED NOT DETECTED Final   Enterobacter cloacae complex NOT DETECTED NOT DETECTED Final   Escherichia coli NOT DETECTED NOT DETECTED Final   Klebsiella aerogenes NOT DETECTED NOT DETECTED Final   Klebsiella oxytoca NOT DETECTED NOT DETECTED Final   Klebsiella pneumoniae NOT DETECTED NOT DETECTED Final   Proteus species NOT DETECTED NOT DETECTED Final   Salmonella species NOT DETECTED NOT DETECTED Final   Serratia marcescens NOT DETECTED NOT DETECTED Final   Haemophilus influenzae NOT DETECTED NOT DETECTED Final  Neisseria meningitidis NOT DETECTED NOT DETECTED Final   Pseudomonas aeruginosa NOT DETECTED NOT DETECTED Final   Stenotrophomonas maltophilia NOT DETECTED NOT DETECTED Final   Candida albicans NOT DETECTED NOT DETECTED Final   Candida auris NOT DETECTED NOT DETECTED Final   Candida glabrata NOT DETECTED NOT DETECTED Final   Candida krusei NOT DETECTED NOT DETECTED Final   Candida parapsilosis NOT DETECTED NOT DETECTED Final   Candida tropicalis NOT DETECTED NOT DETECTED Final   Cryptococcus neoformans/gattii NOT DETECTED NOT DETECTED Final    Comment: Performed at [facility], [address] [place], [place] [address]  Blood Culture (routine x 2)     Status: Abnormal   Collection Time: 05/04/21  9:29 PM   Specimen: BLOOD  Result Value Ref Range Status   Specimen Description   Final    BLOOD RIGHT ANTECUBITAL Performed at [facility], [address] [place], [place] [address]    Special Requests   Final    BOTTLES DRAWN AEROBIC AND ANAEROBIC Blood Culture adequate volume Performed at [facility], [address] [place], [place] [address]    Culture  Setup Time   Final    GRAM POSITIVE COCCI IN BOTH AEROBIC AND ANAEROBIC BOTTLES CRITICAL VALUE NOTED.  VALUE IS CONSISTENT WITH PREVIOUSLY REPORTED AND  CALLED VALUE. Performed at [facility], [address] [place], [place] [address]    Culture (A)  Final    GROUP A STREP (S.PYOGENES) ISOLATED HEALTH DEPARTMENT NOTIFIED SUSCEPTIBILITIES PERFORMED ON PREVIOUS CULTURE WITHIN THE LAST 5 DAYS. STAPHYLOCOCCUS EPIDERMIDIS THE SIGNIFICANCE OF ISOLATING THIS ORGANISM FROM A SINGLE SET OF BLOOD CULTURES WHEN MULTIPLE SETS ARE DRAWN IS UNCERTAIN. PLEASE NOTIFY THE MICROBIOLOGY DEPARTMENT WITHIN ONE WEEK IF SPECIATION AND SENSITIVITIES ARE REQUIRED. Performed at [facility], [address] [place], [place] [address]    Report Status 05/08/2021 FINAL  Final  Resp Panel by RT-PCR (Flu A&B, Covid) Nasopharyngeal Swab     Status: None   Collection Time: 05/04/21  9:50 PM   Specimen: Nasopharyngeal Swab; Nasopharyngeal(NP) swabs in vial transport medium  Result Value Ref Range Status   SARS Coronavirus 2 by RT PCR NEGATIVE NEGATIVE Final    Comment: (NOTE) SARS-CoV-2 target nucleic acids are NOT DETECTED.  The SARS-CoV-2 RNA is generally detectable in upper respiratory specimens during the acute phase of infection. The lowest concentration of SARS-CoV-2 viral copies this assay can detect is 138 copies/mL. A negative result does not preclude SARS-Cov-2 infection and should not be used as the sole basis for treatment or other patient management decisions. A negative result may occur with  improper specimen collection/handling, submission of specimen other than nasopharyngeal swab, presence of viral mutation(s) within the areas targeted by this assay, and inadequate number of viral copies(<138 copies/mL). A negative result must be combined with clinical observations, patient history, and epidemiological information. The expected result is Negative.  Fact Sheet for Patients:  BloggerCourse.com  Fact Sheet for Healthcare Providers:  SeriousBroker.it  This test is no t yet  approved or cleared by the [place] FDA and  has been authorized for detection and/or diagnosis of SARS-CoV-2 by FDA under an Emergency Use Authorization (EUA). This EUA will remain  in effect (meaning this test can be used) for the duration of the COVID-19 declaration under Section 564(b)(1) of the Act, 21 U.S.C.section 360bbb-3(b)(1), unless the authorization is terminated  or revoked sooner.       Influenza A by PCR NEGATIVE NEGATIVE Final  Influenza B by PCR NEGATIVE NEGATIVE Final    Comment: (NOTE) The Xpert Xpress SARS-CoV-2/FLU/RSV plus assay is intended as an aid in the diagnosis of influenza from Nasopharyngeal swab specimens and should not be used as a sole basis for treatment. Nasal washings and aspirates are unacceptable for Xpert Xpress SARS-CoV-2/FLU/RSV testing.  Fact Sheet for Patients: BloggerCourse.com  Fact Sheet for Healthcare Providers: SeriousBroker.it  This test is not yet approved or cleared by the [place] FDA and has been authorized for detection and/or diagnosis of SARS-CoV-2 by FDA under an Emergency Use Authorization (EUA). This EUA will remain in effect (meaning this test can be used) for the duration of the COVID-19 declaration under Section 564(b)(1) of the Act, 21 U.S.C. section 360bbb-3(b)(1), unless the authorization is terminated or revoked.  Performed at [facility], [address] [place], [place] [address]   MRSA Next Gen by PCR, Nasal     Status: None   Collection Time: 05/05/21 11:02 AM   Specimen: Nasal Mucosa; Nasal Swab  Result Value Ref Range Status   MRSA by PCR Next Gen NOT DETECTED NOT DETECTED Final    Comment: (NOTE) The GeneXpert MRSA Assay (FDA approved for NASAL specimens only), is one component of a comprehensive MRSA colonization surveillance program. It is not intended to diagnose MRSA infection nor to guide or monitor treatment for MRSA  infections. Test performance is not FDA approved in patients less than 65 years old. Performed at [facility], [address], [place], [place] [address]   Urine Culture     Status: None   Collection Time: 05/07/21  2:30 PM   Specimen: Urine, Random  Result Value Ref Range Status   Specimen Description   Final    URINE, RANDOM Performed at [facility], [address], [place], [place] [address]    Special Requests   Final    NONE Performed at [facility], [address] [place], [place] [address]    Culture   Final    NO GROWTH Performed at [facility], [address], [place], [place] [address]    Report Status 05/09/2021 FINAL  Final  Aerobic/Anaerobic Culture w Gram Stain (surgical/deep wound)     Status: None (Preliminary result)   Collection Time: 05/07/21  9:35 PM   Specimen: PATH Other; Tissue  Result Value Ref Range Status   Specimen Description   Final    LEG LEFT Performed at [facility], [address] [place], [place] [address]    Special Requests PT PREVIOUSLY ON CLINDAMYCIN,PENICILLIN  Final   Gram Stain   Final    RARE WBC PRESENT,BOTH PMN AND MONONUCLEAR NO ORGANISMS SEEN    Culture   Final    NO GROWTH 3 DAYS NO ANAEROBES ISOLATED; CULTURE IN PROGRESS FOR 5 DAYS Performed at [facility], [address] [place], [place] [address]    Report Status PENDING  Incomplete  CULTURE, BLOOD (ROUTINE X 2) w Reflex to ID Panel     Status: None (Preliminary result)   Collection Time: 05/09/21  7:13 AM   Specimen: BLOOD  Result Value Ref Range Status   Specimen Description BLOOD BLOOD LEFT HAND  Final   Special Requests   Final    BOTTLES DRAWN AEROBIC AND ANAEROBIC Blood Culture adequate volume   Culture   Final    NO GROWTH 3 DAYS Performed at [facility], [address], [place], [place] [address]  Report Status PENDING  Incomplete  CULTURE, BLOOD (ROUTINE X 2) w Reflex to ID Panel      Status: None (Preliminary result)   Collection Time: 05/09/21  7:23 AM   Specimen: BLOOD  Result Value Ref Range Status   Specimen Description BLOOD LEFT THUMB  Final   Special Requests   Final    BOTTLES DRAWN AEROBIC AND ANAEROBIC Blood Culture results may not be optimal due to an inadequate volume of blood received in culture bottles   Culture   Final    NO GROWTH 3 DAYS Performed at [facility], [address] [place], [place] [address]    Report Status PENDING  Incomplete         Impression:   1)Renal    Acute kidney injury Patient has AKI secondary to ATN Patient has ATN secondary to septic shock Patient creatinine at the time of admission was 3.5 Patient's creatinine has worsened to 4.2   Patient acute kidney injury is worsening patient hyperkalemia is worsening Patient acidosis was worsening Yesterday patient CRRT was initiated on May 11, 2021 We will continue patient on CRRT       2)Septic shock Patient continues to be on vasopressors  Patient yesterday was on 4 pressors.  Today we have been able to titrate blood pressure of Patient though is requiring 3 pressors   3)Anemia of Critical illness  CBC Latest Ref Rng & Units 05/11/2021 05/11/2021 05/11/2021  WBC 4.0 - 10.5 K/uL - - 42.8(H)  Hemoglobin 12.0 - 15.0 g/dL 7.6(H) 6.0(V) 10.8(L)  Hematocrit 36.0 - 46.0 % 30.1(L) 29.8(L) 33.2(L)  Platelets 150 - 400 K/uL - - 219       HGb at goal (9--11)   4) Secondary hyperparathyroidism -CKD Mineral-Bone Disorder/Renal failure associated hyperphosphatemia    Lab Results  Component Value Date   CALCIUM 6.3 (LL) 05/12/2021   PHOS 6.1 (H) 05/12/2021    Patient phosphorus is much better than before yesterday patient had a phosphorus of 14 It has come down to 6.1   5)Hypocalcemia Patient calcium when corrected for albumin of 2 comes out to be 6.3+1.6 is equal to 7.9 Patient creatinine calcium is most likely above 8  6) Electrolytes    BMP Latest Ref Rng & Units 05/12/2021 05/11/2021 05/11/2021  Glucose 70 - 99 mg/dL 371(G) 626(R) 485(I)  BUN 8 - 23 mg/dL 62(V) 03(J) 00(X)  Creatinine 0.44 - 1.00 mg/dL 3.81(W) 2.99(B) 7.16(R)  BUN/Creat Ratio 6 - 22 (calc) - - -  Sodium 135 - 145 mmol/L 132(L) 136 135  Potassium 3.5 - 5.1 mmol/L 4.1 4.6 4.5  Chloride 98 - 111 mmol/L 98 95(L) 96(L)  CO2 22 - 32 mmol/L 25 25 22   Calcium 8.9 - 10.3 mg/dL 6.3(LL) 6.7(L) 5.5(LL)     Sodium Normonatremic   Potassium Patient was hyperkalemic earlier now better    7)Acute metabolic acidosis Patient yesterday required IV bicarb drip Patient acute metabolic acidosis is now better Will DC patient's bicarb drip  8)Acute respiratory failure Patient remains intubated   Plan:  I spent around 40 minutes of critical care time as patient is on CRRT secondary to acidosis, hyperkalemia, acute kidney injury and hyperphosphatemia We will continue patient on CRRT I discussed patient case with the ICU team and with the family   Anthonia Monger s Wolfgang Phoenix 05/12/2021, 8:46 AM

## 2021-05-12 NOTE — Plan of Care (Signed)
Continuing with plan of care. 

## 2021-05-12 NOTE — Consult Note (Signed)
NEURO HOSPITALIST CONSULT NOTE   Requesting physician: Dr. Patsey Berthold  Reason for Consult:Myoclonic seizure activity in the setting of suspected anoxia  History obtained from:  Chart     HPI:                                                                                                                                          Alexandra Foster is an 71 y.o. female with a PMHx of asthma/COPD, DM2, HTN, OSA on CPAP, GERD, COVID-19 pneumonia, and bilateral lower extremity edema who was admitted to the ICU on 1/22 for streptococcal toxic shock syndrome 2/2 LLE cellulitis with Strep pyogenes bacteremia. She also had AKI in the setting of shock, antihypertensive use and NSAID use. Blood pressure responded to aggressive volume resuscitation in the ED. She was awake and alert in the ED on initial evaluation by CCM.   She initially was intubated, then extubated, but required reintubation overnight on 1/28 in the setting of hemodynamic deterioration following wound debridement in the OR.   While here she has developed new onset of atrial fibrillation and her cellulitis has progressed to necrotizing fascitis and she continues to be in severe septic shock requiring pressors. She is on penicillin G and linezolid currently. Prognosis is felt to be poor by CCM.   Yesterday morning, CCM exam revealed quivering/myoclonus of the jaw and intermittent eye opening without significant stimulus. At about 8 PM yesterday, the patient began to exhibit involuntary jerking following suction by RT. CCM NP was called to examine the patient and noted that she was demonstrating rhythmic jerking of bilateral arms and head with no purposeful movement noted, no response to stimuli and no blink to threat. Event resolved without PRN administration, lasting about 2 minutes. EEG was ordered, the patient was loaded with 1000 mg Keppra followed by orders for 1000 mg BID while on CRRT. Versed PRN seizure activity was  also ordered. CCM note stated that VPA would be avoided due to shock liver. The myoclonic seizure activity is suspected to be due to anoxic brain injury. However, the patient so far has been too unstable for MRI.    Past Medical History:  Diagnosis Date   AKI (acute kidney injury) ([place])    a. 04/2021 in setting of bacteremia/shock.   Arthritis    Asthma    Bacteremia    a. 04/2021 S pyogenes bacteremia in setting of lower ext cellulitis.   COPD (chronic obstructive pulmonary disease) (H[facility])    Diabetes mellitus without complication (HCC)    Endometriosis    GERD (gastroesophageal reflux disease)    History of echocardiogram    a. 07/2013 Echo: EF 55-60%, impaired relaxation, mild TR; b. 04/2021 Echo: EF 50-55%, mild LVH, nl RV fxn, mild BAE, Ao sclerosis w/o stenosis.  Hypertension    Obesity    PAF (paroxysmal atrial fibrillation) ([place])    a. 04/2021 in setting of septic shock/cellulitis.   Sleep apnea    CPAP    Past Surgical History:  Procedure Laterality Date   ABDOMINAL HYSTERECTOMY     COLONOSCOPY  10/29/2006   Dr Allen Norris   COLONOSCOPY WITH PROPOFOL N/A 11/05/2016   Procedure: COLONOSCOPY WITH PROPOFOL;  Surgeon: Robert Bellow, MD;  Location: [facility] ENDOSCOPY;  Service: Endoscopy;  Laterality: N/A;   NASAL SINUS SURGERY  2002   Dr Carlis Abbott   WOUND DEBRIDEMENT Left 05/07/2021   Procedure: DEBRIDEMENT WOUND;  Surgeon: Herbert Pun, MD;  Location: [facility] ORS;  Service: General;  Laterality: Left;    Family History  Problem Relation Age of Onset   Congestive Heart Failure Mother    Coronary artery disease Father 52   Breast cancer Sister 2   Heart disease Brother    Varicose Veins Brother    Alcohol abuse Brother               Social History:  reports that she quit smoking about 20 years ago. Her smoking use included cigarettes. She has a 80.00 pack-year smoking history. She quit smokeless tobacco use about 20 years ago.  Her smokeless tobacco use included snuff. She  reports that she does not drink alcohol and does not use drugs.  No Known Allergies  MEDICATIONS:                                                                                                                     Scheduled:  vitamin C  500 mg Oral BID   budesonide (PULMICORT) nebulizer solution  0.25 mg Nebulization BID   chlorhexidine gluconate (MEDLINE KIT)  15 mL Mouth Rinse BID   Chlorhexidine Gluconate Cloth  6 each Topical Q0600   docusate  100 mg Per Tube BID   free water  30 mL Per Tube Q4H   heparin injection (subcutaneous)  5,000 Units Subcutaneous Q8H   hydrocortisone sod succinate (SOLU-CORTEF) inj  100 mg Intravenous Q8H   insulin aspart  1-3 Units Subcutaneous Q4H   insulin detemir  38 Units Subcutaneous BID   ipratropium-albuterol  3 mL Nebulization Q6H   mouth rinse  15 mL Mouth Rinse 10 times per day   multivitamin with minerals  1 tablet Per Tube Daily   nutrition supplement (JUVEN)  1 packet Per Tube BID BM   pantoprazole (PROTONIX) IV  40 mg Intravenous Q24H   [START ON 05/13/2021] polyethylene glycol  17 g Per Tube Daily   sodium chloride flush  10-40 mL Intracatheter Q12H   zinc sulfate  220 mg Per Tube Daily   Continuous:  sodium chloride Stopped (05/10/21 2242)   DOPamine Stopped (05/11/21 2206)   epinephrine 12 mcg/min (05/12/21 2100)   feeding supplement (PIVOT 1.5 CAL) Stopped (05/10/21 2054)   HYDROmorphone 0.5 mg/hr (05/11/21 1915)   Immune Globulin 10%     levETIRAcetam 1,000 mg (05/12/21  1202)   linezolid (ZYVOX) IV Stopped (05/12/21 1155)   norepinephrine (LEVOPHED) Adult infusion Stopped (05/12/21 1816)   pencillin G potassium IV 3 Million Units (05/12/21 2100)   prismasol BGK 2/2.5 dialysis solution 1,500 mL/hr at 05/12/21 1824   prismasol BGK 2/2.5 replacement solution 500 mL/hr at 05/12/21 1121   prismasol BGK 2/2.5 replacement solution 500 mL/hr at 05/12/21 1130   vasopressin 0.04 Units/min (05/12/21 2100)   . ROS:                                                                                                                                        Unable to obtain due to unresponsiveness.    Blood pressure (!) 138/43, pulse 92, temperature 98.2 F (36.8 C), temperature source Axillary, resp. rate 20, height '5\' 5"'  (1.651 m), weight 130.4 kg, SpO2 98 %.   General Examination:                                                                                                       Physical Exam  HEENT-  [place]/AT. Scleral icterus noted.    Lungs- Intubated.    Extremities- Dressing and wound vac to LLE   Neurological Examination Mental Status: Opens eyes to sternal rub and localizes with RUE. Will also move LUE towards chest when RUE is held in position during sternal rub. Does not gaze towards or away from visual stimuli. No attempts to communicate. Not responding to commands.  Cranial Nerves: II: PERRL 2 mm >> 1 mm. No blink to threat bilaterally III,IV, VI: No ptosis. Eyes are dysconjugated. Doll's eye reflex suppressed in the awake state.  V,VII: Weak corneal reflexes bilaterally VIII: Not responding to voice IX,X: Intubated XI: Head is midline XII: Intubated Motor/Sensory: Flaccid tone x 4 while at rest.   Not responding to any motor commands.  Localizes with RUE during sternal rub, moving with 2/5 strength. Will also move LUE slightly towards chest, but more weakly than RUE, when RUE is held in position during sternal rub. RLE only movement is dorsiflexion of foot to noxious plantar stimulation.  No movement of LLE to any stimuli.  Deep Tendon Reflexes: Hypoactive throughout Plantars: Right: Weakly upgoing   Left: Mute Cerebellar/Gait: Unable to assess   Lab Results: Basic Metabolic Panel: Recent Labs  Lab 05/09/21 0545 05/10/21 0450 05/10/21 2040 05/11/21 0040 05/11/21 0531 05/11/21 1600 05/11/21 2052 05/12/21 0431  NA 131* 133* 138 135 137 135 136 132*  K 4.3 4.6 >  7.5* 6.0* 5.4* 4.5 4.6 4.1  CL 98 101  106 99 98 96* 95* 98  CO2 21* 19* <7* 10* 15* '22 25 25  ' GLUCOSE 119* 145* 24* 343* 368* 204* 123* 143*  BUN 77* 83* 86* 84* 92* 89* 72* 53*  CREATININE 3.88* 3.68* 4.32* 4.12* 4.21* 4.00* 3.28* 2.38*  CALCIUM 7.2* 7.5* 7.5* 7.0* 6.8* 5.5* 6.7* 6.3*  MG 2.7* 3.0* 3.6*  --  2.9*  --  2.2  --   PHOS 7.8* 8.9* 17.5*  --  14.2* 10.8* 8.4* 6.1*    CBC: Recent Labs  Lab 05/09/21 0545 05/10/21 0450 05/10/21 2040 05/11/21 0040 05/11/21 0531 05/11/21 1239 05/11/21 2052  WBC 22.2* 23.6* 40.9* 42.7* 42.8*  --   --   NEUTROABS  --   --   --   --  34.4*  --   --   HGB 8.1* 6.2* 4.9* 9.7* 10.8* 9.9* 9.9*  HCT 23.0* 17.8* 16.3* 32.2* 33.2* 29.8* 30.1*  MCV 74.4* 75.1* 86.7 91.5 88.3  --   --   PLT 257 317 267 208 219  --   --     Cardiac Enzymes: Recent Labs  Lab 05/08/21 1313  CKTOTAL 1,575*    Lipid Panel: Recent Labs  Lab 05/08/21 0450 05/11/21 0531  TRIG 183* 145    Imaging: DG Abd 1 View  Result Date: 05/11/2021 CLINICAL DATA:  NG tube placement. EXAM: ABDOMEN - 1 VIEW COMPARISON:  None. FINDINGS: Nasal/orogastric tube passes below the diaphragm, well into the stomach, tip below the included field of view. Endotracheal tube tip projects 3 cm above the carina. IMPRESSION: 1. Well-positioned nasal/orogastric tube. Electronically Signed   By: Lajean Manes M.D.   On: 05/11/2021 11:33   DG Chest Port 1 View  Result Date: 05/11/2021 CLINICAL DATA:  Central line placement. EXAM: PORTABLE CHEST 1 VIEW COMPARISON:  05/10/2021 and earlier exams. FINDINGS: New left internal jugular central venous line. Catheter tip projects in the mid superior vena cava at its confluence with the left brachiocephalic vein. Right internal jugular central venous line, endotracheal tube and nasal/orogastric tube are stable in well positioned. Mild increase in lung base opacities compared to the previous day's exam, consistent with atelectasis accentuated by low lung volumes and patient rotation. Remainder  of the lungs is clear. No convincing pneumothorax. IMPRESSION: 1. New left internal jugular central venous catheter, tip projecting in the mid superior vena cava. 2. No pneumothorax. 3. Mild increase in lung base opacities consistent with atelectasis. No other change. Electronically Signed   By: Lajean Manes M.D.   On: 05/11/2021 12:56   DG Chest Port 1 View  Result Date: 05/10/2021 CLINICAL DATA:  Post intubation, OG tube EXAM: PORTABLE CHEST 1 VIEW COMPARISON:  05/10/2021 at 1906 hours FINDINGS: Lungs are essentially clear.  No pleural effusion or pneumothorax. Endotracheal tube terminates 3 cm above the carina. Enteric tube courses into the stomach. Right IJ venous catheter terminates at the cavoatrial junction. Defibrillator pads overlying the chest. IMPRESSION: Endotracheal tube terminates 3 cm above the carina. Enteric tube courses into the stomach. Electronically Signed   By: Julian Hy M.D.   On: 05/10/2021 21:29   DG Chest Port 1 View  Result Date: 05/10/2021 CLINICAL DATA:  Short of breath EXAM: PORTABLE CHEST 1 VIEW COMPARISON:  05/07/2021 FINDINGS: Single frontal view of the chest demonstrates stable right internal jugular catheter. Endotracheal tube is been removed. Cardiac silhouette is unremarkable. No airspace disease, effusion, or pneumothorax. Minimal linear opacities  at the lung bases consistent with scarring or subsegmental atelectasis. No acute bony abnormalities. IMPRESSION: 1. Bibasilar hypoventilatory change.  No acute process. Electronically Signed   By: Randa Ngo M.D.   On: 05/10/2021 19:13     Assessment: 71 y.o. female with a PMHx of asthma/COPD, DM2, HTN, OSA on CPAP, GERD, COVID-19 pneumonia, and bilateral lower extremity edema who was admitted to the ICU on 1/22 for streptococcal toxic shock syndrome 2/2 LLE cellulitis with Strep pyogenes bacteremia. She also had AKI in the setting of shock, antihypertensive use and NSAID use. Yesterday morning, CCM exam  revealed quivering/myoclonus of the jaw and intermittent eye opening without significant stimulus. At about 8 PM yesterday, the patient began to exhibit involuntary jerking following suction by RT. CCM NP was called to examine the patient and noted that she was demonstrating rhythmic jerking of bilateral arms and head with no purposeful movement noted, no response to stimuli and no blink to threat. Event resolved without PRN administration, lasting about 2 minutes. EEG was ordered, the patient was loaded with 1000 mg Keppra followed by orders for 1000 mg BID while on CRRT. Versed PRN seizure activity was also ordered. CCM note stated that VPA would be avoided due to liver shock. The myoclonic seizure activity is suspected to be due to anoxic brain injury. However, the patient so far has been too unstable for MRI.  - Neurological exam reveals patient to be in an awake semiresponsive state, localizing to sternal rub but not following any commands. No myoclonus or other seizure activity noted.  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  - Severe AKI on CRRT - Multiple electrolyte derangements - Overall prognosis is poor - Neurological prognostication will require MRI brain to assess for possible anoxic brain injury.   Recommendations: - EEG pending.  - Continue Keppra 1000 mg IV BID while on CRRT. Will need to switch to alternate renal dosing regimen if started on intermittent hemodialysis - Continue Versed PRN seizure activity - Agree that VPA is not a good choice given shock liver - Neurology will continue to follow.   40 minutes spent in the neurological evaluation and management of this critically ill patient.   Electronically signed: Dr. Kerney Elbe 05/12/2021, 8:34 AM

## 2021-05-12 NOTE — Progress Notes (Signed)
NAME:  Alexandra Foster, MRN:  [id number], DOB:  [birth year], LOS: 8 ADMISSION DATE:  05/04/2021  BRIEF SYNOPSIS 71 y.o female with medical history significant of Asthma/COPD, T2DM, HTN, OSA on CPAP, GERD, COVID-19 pneumonia, and Bilateral lower extremity edema who presented to the ED with chief complaint of LLE pain and chills since Thursday.  Patient was given 30 cc/kg of fluids and started on broad-spectrum antibiotics for sepsis with septic shock. Patient remained hypotensive despite IVF boluses therefore was started on Levophed. PCCM consulted and patient was admitted to the ICU. She was taken to the OR by general surgery and left leg was debrided  for necrotizing fasciitis and a wound vac placed. Patient developed severe refractory shock pos-op requiring multiple pressors. She is intubated and on 0.5mg  infusion of dilaudid for pain and sedation  Subjective: Patient remains intubated. On epi at , levo at and vasopressin at 0.4. She would open her eyes to voice and withdraw to pain. K+ trending down. Remains anuric. Seen by Dr. Evie Lacks from vascular this morning. States that she may take patient to the OR for a left AKA if she improves hemodynamically and if general surgery is okay.  Per Dr. Jayme Cloud, she will be okay if patient can have left above-the-knee amputation as this may improve her outcome.  She remains on CRRT.  Patient had an episode of myoclonus last night and was loaded with Keppra.  No further seizure-like activity noted.  He has an EEG due today and she is also due for a neurology evaluation.  Nephrology is following.  No major changes to CRRT parameters this morning.  Per nursing staff, patient has not had a bowel movement since the 27th  Past Medical History     Arthritis   Asthma   COPD (chronic obstructive pulmonary disease) (HCC)   Diabetes mellitus without complication (HCC)   Endometriosis   GERD (gastroesophageal reflux disease)   Hypertension   Obesity    Sleep apnea    CPAP    Significant Hospital Events   05/04/21: Admitted to the ICU with severe sepsis with shock secondary to cellulitis of LLE 1/23 off pressors +Afib with RVR 1/24 blood pressure low  1/24 to OR for fasciotomy 1/25 post op resp failure with severe septic shock 1/26 remains on pressors 1/27 returns to the OR today for debridement, wound VAC 1/28 overnight with hemodynamic deterioration, required reintubation, currently on pressors 1/29 remains on 3 pressors, CRRT, wound VAC and minimal sedation.  Awakens to voice and touch.  Seen by vascular surgery and awaiting input from general surgery regarding amputation of left leg   Significant Diagnostic Tests:  1/21: Chest Xray> no active cardiopulmonary process 1/21: CT left lower leg>Diffuse subcutaneous edema may represent cellulitis. No drainable fluid collection/abscess 1/21: Ultrasound lower unilateral left> no DVT   Micro Data:  1/21: SARS-CoV-2 PCR> negative 1/21: Influenza PCR> negative 1/21: Blood culture x2> Streptococcus pyogenes 1/21: Urine Culture> negative 1/21: MRSA PCR>> negative   Antimicrobials:  Vancomycin 1/21> off Cefepime 1/21> off Ceftriaxone 1/21 >> off Penicilli G 1/24>> Linezolid 1/25>>  *Antimicrobials per ID Interim History / Subjective:  Patient with acute necrotizing fasciitis with emergent fasciotomy Critically ill remains on pressors Prognosis is poor Returned to the OR 1/27 progressive hemodynamic instability after OR  Objective   Blood pressure (!) 145/47, pulse 92, temperature (!) 97.5 F (36.4 C), temperature source Esophageal, resp. rate 20, height 5\' 5"  (1.651 m), weight 130.4 kg, SpO2 98 %.  Vent Mode: PRVC FiO2 (%):  [70 %] 70 % Set Rate:  [20 bmp] 20 bmp Vt Set:  [460 mL] 460 mL PEEP:  [5 cmH20] 5 cmH20 Plateau Pressure:  [17 cmH20] 17 cmH20   Intake/Output Summary (Last 24 hours) at 05/12/2021 0920 Last data filed at 05/12/2021 0900 Gross per 24 hour  Intake  8451.44 ml  Output 4995 ml  Net 3456.44 ml    Filed Weights   05/05/21 1100 05/11/21 0500 05/12/21 0500  Weight: 123.4 kg 128.3 kg 130.4 kg   Unable to obtain ROS due to the patient being intubated  PHYSICAL EXAMINATION:  GENERAL: Obese, particularly ill-appearing woman, sedated on the ventilator, opens eyes to voice and touch HEAD: Normocephalic, atraumatic.  EYES: Pupils equal, reactive, + corneal reflexes;  No scleral icterus.  Mild scleral edema. MOUTH: Orotracheally intubated, mucosa moist. NECK: Supple. No thyromegaly. Trachea midline. No JVD.  No adenopathy.  No crepitus. PULMONARY: Good air entry bilaterally.  No adventitious sounds. CARDIOVASCULAR: AP regular, S1 and S2; no murmur ABDOMEN: Obese, distended, firm and hypoactive bowel sounds. MUSCULOSKELETAL: Left lower extremity with wound VAC in place, surgery redressing.  Status post fasciotomy NEUROLOGIC:  Eye opening without significant stimulus, intermittent, withdraws to pain, moves all extremities to noxious stimulus.  SKIN: Intact,warm, LLE with wound VAC and occlusive dressing  Assessment & Plan  Acute Hypoxic Respiratory Failure in the setting of worsening septic shock PMHx: COPD -Patient re-intubated due to increased lethargy and work of breathing; mains on full vent support with the following settings: PRVC  8 mL/kg, 100% FiO2, 5 PEEP, continue ventilator support & lung protective strategies - Wean PEEP & FiO2 as tolerated, maintain SpO2 > 90% - Head of bed elevated 30 degrees, VAP protocol in place - Plateau pressures less than 30 cm H20  - Intermittent chest x-ray & ABG PRN - Daily WUA with SBT as tolerated  - Ensure adequate pulmonary hygiene  - Budesonide nebs BID, bronchodilators PRN - PAD protocol in place: continue Dilaudid drip (avoiding fentanyl due to linezolid administration)   Severe Metabolic Acidosis Acute Kidney Injury  VBG (as unable to obtain ABG): < 6.9/ 38/69 >> 7.04/33/89 Baseline Cr:  0.98, Cr on admission: 3.47 > 4.32>> 4.12; now down to 2.29 - sodium bicarbonate infusion discontinued - VBG prn - Strict I/O's: alert provider if UOP < 0.5 mL/kg/hr - Continue volume resuscitation as tolerated - Daily BMP, replace electrolytes PRN - Avoid nephrotoxic agents as able, ensure adequate renal perfusion - CRRT per nephrology - Nephrology following   Refractory Septic Shock secondary to LLE necrotizing fasciitis Streptococcal toxic shock syndrome Hypovolemic shock aggravating the above - Currently on epinephrine, levophed and vasopressin gttes - Weaning pressors as tolerated - Continue IVIG administration x 3 days as ordered - Continue albumin infusion and sodium bicarb - Monitor CVP and notify provider is <8  or >12 - f/u cortisol level >> 26.7  - SDS ordered  Group A strep necrotizing fasciitis L LE Streptococcal toxic shock - Continue antibiotics: Penicillin G/linezolid - Status postdebridement, wound VAC per surgery - Poor prognosis -Vascular and general surgery following   Anemia: Acute Blood Loss in the setting of I&D intervention and LLE wound vac placement Hgb: 4.9>>9.7>>10.8; now down to 8.2 - Continue to trend CBC and transfuse prn; if going to the OR for any procedure, consider transfusing prior to procedure - Monitor for s/s of bleeding -Monitor wound VAC output; if BRB, notify surgery - Daily CBC  Diabetes mellitus with poor control  Physiologic stress driving poor control -Transient hypoglycemia, corrected; no recurrent episodes - Now hyperglycemic persistently despite SSI; Continue glycemic control and  - ICU insulin infusion protocol  Transaminitis D/T shock liver - Supportive care - Monitor hepatic function  Acute encephalopathy with questionable myoclonic seizure activity last night 05/11/21 DDx: Acute metabolic, query HIE given shock/massive resuscitation last PM -Toxic metabolic encephalopathy - Concern for toxemic/ischemic encephalopathy  due to prolonged resuscitation - Continue supportive care - Loaded with keppra; continue at 1gram bid -EEG ordered and pending -Prn versed  -Neurology consulted; awaiting input    Best practice:  Diet: NPO Pain/Anxiety/Delirium protocol (if indicated): Yes, initiated VAP protocol (if indicated): Yes, initiated DVT prophylaxis: Subcutaneous Heparin GI prophylaxis: PPI Glucose control: ICU hyperglycemia protocol, insulin drip discontinued; SQ insulin Central venous access:  Rt IJV Arterial line:  N/A Foley:  Yes Mobility:  bed rest  PT consulted: N/A Last date of multidisciplinary goals of care discussion [1/27] Code Status:  full code Disposition: ICU   Patient seen and examined with Dr. Jayme Cloud.  Plan of care updated.  Dr. Jayme Cloud updated  patient's husband, daughter and sister on 1/27.  Very poor prognosis.  They are aware of the patient's very tenuous status.  Need for multiple pressors due to streptococcal toxic shock syndrome.  They desire full code still present.  Labs   CBC: Recent Labs  Lab 05/09/21 0545 05/10/21 0450 05/10/21 2040 05/11/21 0040 05/11/21 0531 05/11/21 1239 05/11/21 2052  WBC 22.2* 23.6* 40.9* 42.7* 42.8*  --   --   NEUTROABS  --   --   --   --  34.4*  --   --   HGB 8.1* 6.2* 4.9* 9.7* 10.8* 9.9* 9.9*  HCT 23.0* 17.8* 16.3* 32.2* 33.2* 29.8* 30.1*  MCV 74.4* 75.1* 86.7 91.5 88.3  --   --   PLT 257 317 267 208 219  --   --      Basic Metabolic Panel: Recent Labs  Lab 05/09/21 0545 05/10/21 0450 05/10/21 2040 05/11/21 0040 05/11/21 0531 05/11/21 1600 05/11/21 2052 05/12/21 0431  NA 131* 133* 138 135 137 135 136 132*  K 4.3 4.6 >7.5* 6.0* 5.4* 4.5 4.6 4.1  CL 98 101 106 99 98 96* 95* 98  CO2 21* 19* <7* 10* 15* 22 25 25   GLUCOSE 119* 145* 24* 343* 368* 204* 123* 143*  BUN 77* 83* 86* 84* 92* 89* 72* 53*  CREATININE 3.88* 3.68* 4.32* 4.12* 4.21* 4.00* 3.28* 2.38*  CALCIUM 7.2* 7.5* 7.5* 7.0* 6.8* 5.5* 6.7* 6.3*  MG 2.7* 3.0* 3.6*   --  2.9*  --  2.2  --   PHOS 7.8* 8.9* 17.5*  --  14.2* 10.8* 8.4* 6.1*    GFR: Estimated Creatinine Clearance: 29.1 mL/min (A) (by C-G formula based on SCr of 2.38 mg/dL (H)). Recent Labs  Lab 05/05/21 1737 05/05/21 2017 05/06/21 0325 05/06/21 0325 05/07/21 0821 05/07/21 1046 05/08/21 0450 05/10/21 0450 05/10/21 2040 05/11/21 0040 05/11/21 0531  PROCALCITON  --   --  66.16  --   --   --   --   --   --   --   --   WBC  --   --  16.2*   < > 21.9*  --    < > 23.6* 40.9* 42.7* 42.8*  LATICACIDVEN 4.1* 2.9*  --   --  1.7 1.7  --   --   --   --   --    < > =  values in this interval not displayed.     Liver Function Tests: Recent Labs  Lab 05/08/21 1313 05/10/21 0450 05/11/21 0531 05/11/21 1600 05/12/21 0431  AST 136* 97* 4,359*  --  6,519*  ALT 47* 36 1,746*  --  1,613*  ALKPHOS 135* 145* 185*  --  232*  BILITOT 3.8* 1.8* 2.5*  --  3.0*  PROT 4.8* 5.0* 4.6*  --  5.3*  ALBUMIN <1.5* <1.5* <1.5* <1.5* 2.1*   2.0*    Coagulation Profile: No results for input(s): INR, PROTIME in the last 168 hours.   Cardiac Enzymes: Recent Labs  Lab 05/08/21 1313  CKTOTAL 1,575*     HbA1C: Hgb A1c MFr Bld  Date/Time Value Ref Range Status  05/05/2021 04:53 AM 6.7 (H) 4.8 - 5.6 % Final    Comment:    (NOTE)         Prediabetes: 5.7 - 6.4         Diabetes: >6.4         Glycemic control for adults with diabetes: <7.0   02/11/2021 09:41 AM 6.3 (H) <5.7 % of total Hgb Final    Comment:    For someone without known diabetes, a hemoglobin  A1c value between 5.7% and 6.4% is consistent with prediabetes and should be confirmed with a  follow-up test. . For someone with known diabetes, a value <7% indicates that their diabetes is well controlled. A1c targets should be individualized based on duration of diabetes, age, comorbid conditions, and other considerations. . This assay result is consistent with an increased risk of diabetes. . Currently, no consensus exists  regarding use of hemoglobin A1c for diagnosis of diabetes for children. .     CBG: Recent Labs  Lab 05/11/21 1959 05/11/21 2055 05/11/21 2353 05/12/21 0407 05/12/21 0803  GLUCAP 119* 124* 142* 160* 158*    No Known Allergies   Medications  Scheduled Meds:  vitamin C  500 mg Oral BID   budesonide (PULMICORT) nebulizer solution  0.25 mg Nebulization BID   chlorhexidine gluconate (MEDLINE KIT)  15 mL Mouth Rinse BID   Chlorhexidine Gluconate Cloth  6 each Topical Q0600   docusate  100 mg Per Tube BID   free water  30 mL Per Tube Q4H   heparin injection (subcutaneous)  5,000 Units Subcutaneous Q8H   hydrocortisone sod succinate (SOLU-CORTEF) inj  100 mg Intravenous Q8H   insulin aspart  1-3 Units Subcutaneous Q4H   insulin detemir  38 Units Subcutaneous BID   ipratropium-albuterol  3 mL Nebulization Q6H   mouth rinse  15 mL Mouth Rinse 10 times per day   multivitamin with minerals  1 tablet Per Tube Daily   nutrition supplement (JUVEN)  1 packet Per Tube BID BM   pantoprazole (PROTONIX) IV  40 mg Intravenous Q24H   polyethylene glycol  17 g Per Tube Daily   sodium chloride flush  10-40 mL Intracatheter Q12H   zinc sulfate  220 mg Per Tube Daily   Continuous Infusions:  sodium chloride Stopped (05/10/21 2242)   albumin human 54 mL/hr at 05/12/21 0900   DOPamine Stopped (05/11/21 2206)   epinephrine 20 mcg/min (05/12/21 0900)   feeding supplement (PIVOT 1.5 CAL) Stopped (05/10/21 2054)   HYDROmorphone 0.5 mg/hr (05/11/21 1915)   Immune Globulin 10%     levETIRAcetam     linezolid (ZYVOX) IV Stopped (05/12/21 0149)   norepinephrine (LEVOPHED) Adult infusion 30 mcg/min (05/12/21 0900)   pencillin G potassium IV 3 Million  Units (05/12/21 0523)   prismasol BGK 2/2.5 dialysis solution 1,500 mL/hr at 05/12/21 0800   prismasol BGK 2/2.5 replacement solution 500 mL/hr at 05/12/21 0131   prismasol BGK 2/2.5 replacement solution 500 mL/hr at 05/12/21 0131    sodium bicarbonate  (isotonic) infusion in sterile water 125 mL/hr at 05/12/21 0900   vasopressin 0.04 Units/min (05/12/21 0900)   PRN Meds:.docusate sodium, heparin, HYDROmorphone, lip balm, midazolam, ondansetron (ZOFRAN) IV, oxyCODONE, polyethylene glycol, sodium chloride, sodium chloride flush   The patient is critically ill with multiple organ systems failure and requires high complexity decision making for assessment and support, frequent evaluation and titration of therapies, application of advanced monitoring technologies and extensive interpretation of multiple databases. Critical Care Time devoted to patient care services described in this note is 45 minutes.  Patient seen and examined with Dr. Jayme Cloud.  Dr. Jayme Cloud and I spoke with Esco from vascular surgery.  Awaiting input from general surgery.  Plan of care discussed with patient's family.  Alexandra [facility] ANP-BC Pulmonary and Critical Care Medicine [facility] Pager [phone] or [phone]  NB: This document was prepared using Dragon voice recognition software and may include unintentional dictation errors.

## 2021-05-12 NOTE — Progress Notes (Signed)
Date of Admission:  05/04/2021    ID: Alexandra Foster is a 71 y.o. female  Principal Problem:   Severe sepsis with septic shock (CODE) ([place]) Active Problems:   COPD (chronic obstructive pulmonary disease) ([place])   Morbid obesity ([place])   Diabetes mellitus type 2 in obese [facility])   Atrial fibrillation and flutter (HCC)   Necrotizing fasciitis ([place])   Acute on chronic respiratory failure ([place])   On mechanically assisted ventilation ([place])   Shock liver   Anemia associated with acute blood loss   Metabolic acidosis   Encounter for continuous renal replacement therapy (CRRT) for acute renal failure (HCC)   Acute encephalopathy   Group A streptococcal infection   Streptococcal toxic shock syndrome ([place])    Subjective:  Spoke to her nurses Pt deteriorated over the weekend Had 2nd surgery on Friday Dropped Hb, hypotensive worsening AKI, shock liver Had to go on CRRT on 05/11/21,  I discussed with Dr.Gonzalez yesterday and she was started on IVIG Yesterday she was on 4 pressors and this morning 3 ( epi, norepi and vasopressin)and now 2  Also on dilaudid Had twitching of the rt eye and rt shoulder like  and was started on keppra EEG pending  Neurology consulted   Objective: Vital signs in last 24 hours:Patient Vitals for the past 24 hrs:  BP Temp Temp src Pulse Resp SpO2 Weight  05/12/21 1815 (!) 142/56 (!) 97.3 F (36.3 C) Esophageal 87 11 100 % --  05/12/21 1800 (!) 143/48 (!) 97.5 F (36.4 C) Esophageal 86 (!) 4 100 % --  05/12/21 1745 (!) 146/49 (!) 97.3 F (36.3 C) Esophageal 86 20 100 % --  05/12/21 1730 (!) 145/50 (!) 97.3 F (36.3 C) Esophageal 86 (!) 23 100 % --  05/12/21 1715 (!) 147/49 (!) 97.3 F (36.3 C) Esophageal 87 20 99 % --  05/12/21 1700 (!) 150/52 (!) 97.3 F (36.3 C) Esophageal 88 19 99 % --  05/12/21 1645 (!) 149/51 (!) 97.3 F (36.3 C) Esophageal 87 20 99 % --  05/12/21 1630 (!) 148/51 (!) 97.3 F (36.3 C) Esophageal 87 20 99 % --  05/12/21 1615  (!) 147/51 (!) 97.5 F (36.4 C) Esophageal 88 12 100 % --  05/12/21 1600 (!) 160/57 (!) 97.5 F (36.4 C) Esophageal 89 (!) 22 100 % --  05/12/21 1545 (!) 155/51 97.9 F (36.6 C) Esophageal -- 18 100 % --  05/12/21 1530 (!) 127/58 98.4 F (36.9 C) Esophageal -- 18 100 % --  05/12/21 1515 (!) 136/50 98.2 F (36.8 C) Esophageal -- 19 100 % --  05/12/21 1500 (!) 135/50 98.2 F (36.8 C) Esophageal -- (!) 21 100 % --  05/12/21 1445 (!) 137/47 98.1 F (36.7 C) Esophageal -- (!) 21 100 % --  05/12/21 1430 (!) 136/48 98.1 F (36.7 C) Esophageal -- 19 97 % --  05/12/21 1415 (!) 137/45 98.2 F (36.8 C) Esophageal -- (!) 21 96 % --  05/12/21 1400 (!) 144/47 98.2 F (36.8 C) Esophageal -- (!) 21 97 % --  05/12/21 1345 (!) 145/50 98.2 F (36.8 C) Esophageal -- (!) 23 95 % --  05/12/21 1330 (!) 142/49 98.1 F (36.7 C) Esophageal -- (!) 21 95 % --  05/12/21 1324 -- -- -- -- -- 94 % --  05/12/21 1315 (!) 142/47 98.1 F (36.7 C) Esophageal -- 19 94 % --  05/12/21 1300 (!) 142/46 98.1 F (36.7 C) Esophageal -- 17 95 % --  05/12/21 1245 (!) 145/50 97.9 F (36.6 C) Esophageal -- 16 97 % --  05/12/21 1230 (!) 131/47 97.9 F (36.6 C) Esophageal -- 20 96 % --  05/12/21 1215 (!) 131/49 97.7 F (36.5 C) Esophageal -- (!) 23 96 % --  05/12/21 1200 (!) 114/45 97.7 F (36.5 C) Esophageal -- 19 96 % --  05/12/21 1145 (!) 135/45 97.7 F (36.5 C) Esophageal -- (!) 23 96 % --  05/12/21 1130 (!) 138/50 (!) 97.5 F (36.4 C) Esophageal -- (!) 21 96 % --  05/12/21 1115 (!) 134/44 (!) 97.3 F (36.3 C) Esophageal -- 20 97 % --  05/12/21 1100 (!) 131/47 (!) 97.2 F (36.2 C) Esophageal -- 20 97 % --  05/12/21 1045 (!) 132/45 (!) 96.4 F (35.8 C) Esophageal -- 20 96 % --  05/12/21 1030 (!) 130/48 (!) 97 F (36.1 C) Esophageal -- 20 96 % --  05/12/21 1015 (!) 144/48 (!) 97.2 F (36.2 C) Esophageal -- 18 96 % --  05/12/21 1000 (!) 138/42 (!) 97.2 F (36.2 C) Esophageal -- 19 98 % --  05/12/21 0945 (!)  147/42 (!) 97.2 F (36.2 C) Esophageal -- 20 97 % --  05/12/21 0930 (!) 145/42 (!) 97.3 F (36.3 C) Esophageal -- 20 96 % --  05/12/21 0915 (!) 146/42 (!) 97.3 F (36.3 C) Esophageal -- 20 96 % --  05/12/21 0900 (!) 145/47 (!) 97.5 F (36.4 C) Esophageal -- 20 98 % --  05/12/21 0845 (!) 140/44 97.7 F (36.5 C) Esophageal -- 20 99 % --  05/12/21 0830 (!) 143/41 97.7 F (36.5 C) -- -- 20 99 % --  05/12/21 0815 (!) 140/42 97.7 F (36.5 C) Esophageal -- 20 98 % --  05/12/21 0800 (!) 138/43 98.2 F (36.8 C) Axillary -- 20 98 % --  05/12/21 0745 (!) 140/45 (!) 97.3 F (36.3 C) Esophageal -- 19 96 % --  05/12/21 0740 -- -- -- -- -- 98 % --  05/12/21 0730 (!) 135/45 (!) 97.3 F (36.3 C) Esophageal -- (!) 21 99 % --  05/12/21 0715 (!) 136/53 (!) 97.2 F (36.2 C) Esophageal -- 20 96 % --  05/12/21 0700 (!) 130/43 (!) 97.2 F (36.2 C) Esophageal -- 20 96 % --  05/12/21 0600 (!) 125/39 (!) 97 F (36.1 C) -- -- 20 -- --  05/12/21 0540 -- (!) 96.8 F (36 C) -- -- 18 -- --  05/12/21 0500 (!) 129/42 (!) 96.8 F (36 C) -- -- 20 -- 130.4 kg  05/12/21 0400 (!) 133/38 98.3 F (36.8 C) Axillary -- 20 -- --  05/12/21 0300 (!) 129/42 (!) 96.4 F (35.8 C) -- -- 20 -- --  05/12/21 0216 (!) 129/41 (!) 96.3 F (35.7 C) -- -- 18 100 % --  05/12/21 0200 (!) 129/36 (!) 96.4 F (35.8 C) -- -- 17 -- --  05/12/21 0100 (!) 129/39 97.8 F (36.6 C) Axillary -- 20 -- --  05/12/21 0030 (!) 124/39 (!) 96.1 F (35.6 C) -- -- 20 -- --  05/12/21 0015 (!) 124/42 (!) 96.1 F (35.6 C) -- -- 20 -- --  05/12/21 0000 -- -- -- -- -- 100 % --  05/11/21 2300 -- -- -- -- -- 95 % --  05/11/21 2245 (!) 117/43 (!) 96.3 F (35.7 C) -- -- 20 -- --  05/11/21 2145 (!) 148/46 (!) 96.4 F (35.8 C) -- -- (!) 23 -- --  05/11/21 2130 (!) 146/50 (!) 96.4 F (  35.8 C) -- -- (!) 23 -- --  05/11/21 2115 (!) 149/49 (!) 96.4 F (35.8 C) -- -- (!) 22 -- --  05/11/21 2100 (!) 145/47 (!) 96.4 F (35.8 C) Core 92 (!) 23 95 % --   05/11/21 2045 -- (!) 96.4 F (35.8 C) -- -- (!) 23 -- --  05/11/21 2030 (!) 136/47 (!) 96.4 F (35.8 C) -- -- (!) 22 -- --  05/11/21 2015 -- (!) 96.6 F (35.9 C) -- -- (!) 21 -- --  05/11/21 2001 126/64 (!) 96.6 F (35.9 C) -- -- (!) 22 -- --  05/11/21 2000 126/64 (!) 96.6 F (35.9 C) -- -- (!) 22 -- --  05/11/21 1945 -- (!) 96.6 F (35.9 C) -- -- (!) 22 -- --  05/11/21 1930 (!) 113/58 (!) 96.8 F (36 C) -- -- (!) 21 -- --  05/11/21 1915 -- (!) 97 F (36.1 C) -- -- (!) 21 -- --  05/11/21 1900 113/69 (!) 97.2 F (36.2 C) -- -- (!) 22 -- --   PHYSICAL EXAM:  General: intubated, on 2 pressors Neck:RT IJ central line  Left IJ HD cath Lungs: b/l air entry Heart: s1s2 , sinus   Abdomen: Soft, non-tender,not distended. Bowel sounds normal. No masses Foley  Extremities:left leg   wound vac Post debridement 05/09/21     05/07/21   On admission   Anasarca. Neurologic: cannot be assessed  Lab Results CBC Latest Ref Rng & Units 05/12/2021 05/11/2021 05/11/2021  WBC 4.0 - 10.5 K/uL 40.8(H) - -  Hemoglobin 12.0 - 15.0 g/dL 8.2(L) 9.9(L) 9.9(L)  Hematocrit 36.0 - 46.0 % 24.5(L) 30.1(L) 29.8(L)  Platelets 150 - 400 K/uL 187 - -    CMP Latest Ref Rng & Units 05/12/2021 05/12/2021 05/12/2021  Glucose 70 - 99 mg/dL 162(H) 155(H) 143(H)  BUN 8 - 23 mg/dL 44(H) 50(H) 53(H)  Creatinine 0.44 - 1.00 mg/dL 2.04(H) 2.29(H) 2.38(H)  Sodium 135 - 145 mmol/L 132(L) 132(L) 132(L)  Potassium 3.5 - 5.1 mmol/L 4.4 4.4 4.1  Chloride 98 - 111 mmol/L 94(L) 94(L) 98  CO2 22 - 32 mmol/L 26 27 25   Calcium 8.9 - 10.3 mg/dL 7.8(L) 7.6(L) 6.3(LL)  Total Protein 6.5 - 8.1 g/dL - 6.3(L) 5.3(L)  Total Bilirubin 0.3 - 1.2 mg/dL - 3.9(H) 3.0(H)  Alkaline Phos 38 - 126 U/L - 262(H) 232(H)  AST 15 - 41 U/L - 6,250(H) 6,519(H)  ALT 0 - 44 U/L - 1,711(H) 1,613(H)     Microbiology: 05/04/21 BC Group A strep 4/4 1of 4 staph epidermidis- a contaminant 05/07/21- Wound culture NG 05/09/21  BC-NG  Assessment/Plan:  Streptococcus pyogenes bacteremia with severe sepsis with shock, multiorgan failure  left leg necrotizing celluliits>fascitis S/p debridement  X2 - 05/07/21 and 05/10/21   Pt is currently on penicillin and linezolid  and IVIG   On 2 pressors Leucocytosis -  worsening - could be from stress dose steroids and also reactive to Hb drop   AKI- worsening - hypovolemic shock and septic shock Now on CRRT   Anemia- got PRBC  Shock liver with increasing transaminitis and bilirubin Severe hypoalbuminemia Third spacing- getting Albumin'  DM on sliding scale  Afib now in sinus rhythm-   Intensivist, nephrologist surgeon on board  Discussed the management with her nurses

## 2021-05-12 NOTE — Consult Note (Signed)
PHARMACY CONSULT NOTE - FOLLOW UP  Pharmacy Consult for Electrolyte Monitoring and Replacement   Recent Labs: Potassium (mmol/L)  Date Value  05/12/2021 4.1  08/11/2013 3.3 (L)   Magnesium (mg/dL)  Date Value  05/11/2021 2.2   Calcium (mg/dL)  Date Value  05/12/2021 6.3 (LL)   Calcium, Total (mg/dL)  Date Value  08/11/2013 9.2   Albumin (g/dL)  Date Value  05/12/2021 2.0 (L)  05/12/2021 2.1 (L)  08/11/2013 3.1 (L)   Phosphorus (mg/dL)  Date Value  05/12/2021 6.1 (H)   Sodium (mmol/L)  Date Value  05/12/2021 132 (L)  08/11/2013 138     Assessment: Pharmacy has been consulted to monitor and replace electrolytes in 71 yo patient with severe sepsis and strep pyogenes bacteremia.    Goal of Therapy:  Electrolytes WNL  Plan:  --No replacement indicated at this time --Continue to follow along  Oswald Hillock, PharmD, BCPS Clinical Pharmacist 05/12/2021 8:38 AM

## 2021-05-12 NOTE — Progress Notes (Signed)
Progress Note  Patient Name: Alexandra Foster Date of Encounter: 05/12/2021  [facility] Cardiologist: Kathlyn Sacramento, MD    Patient Profile     71 y.o. female with a history of asthma/COPD, HTN, DMII, obesity, OSA on CPAP, GERD, and COVID-19 pneumonia, who was admitted 1/22 w/ L LE necrotizing fasciitis, septic shock, S pyogenes bacteremia, hypotension requiring phenylephrine and norepinephrine and AKI and severe anemia.  Developed rapid Afib 1/23 Converted to sinus vs ectopic atrial rhythm early on 1/24 on amio.  Because of anemia anticoagulation was deferred.  s/p debridement of LLE. 1/27 hemodynamic deterioration requiring intubation back on pressors indeed, epinephrine, norepinephrine, dopamine, vasopressin; 1/29 remains intubated now on 60% on epinephrine, nor epi and vasopressin AKI on CRRT    Subjective   Intubated and sedated   Inpatient Medications    Scheduled Meds:  vitamin C  500 mg Oral BID   budesonide (PULMICORT) nebulizer solution  0.25 mg Nebulization BID   chlorhexidine gluconate (MEDLINE KIT)  15 mL Mouth Rinse BID   Chlorhexidine Gluconate Cloth  6 each Topical Q0600   docusate  100 mg Per Tube BID   free water  30 mL Per Tube Q4H   heparin injection (subcutaneous)  5,000 Units Subcutaneous Q8H   hydrocortisone sod succinate (SOLU-CORTEF) inj  100 mg Intravenous Q8H   insulin aspart  1-3 Units Subcutaneous Q4H   insulin detemir  38 Units Subcutaneous BID   ipratropium-albuterol  3 mL Nebulization Q6H   mouth rinse  15 mL Mouth Rinse 10 times per day   multivitamin with minerals  1 tablet Per Tube Daily   nutrition supplement (JUVEN)  1 packet Per Tube BID BM   pantoprazole (PROTONIX) IV  40 mg Intravenous Q24H   [START ON 05/13/2021] polyethylene glycol  17 g Per Tube Daily   sodium chloride flush  10-40 mL Intracatheter Q12H   zinc sulfate  220 mg Per Tube Daily   Continuous Infusions:  sodium chloride Stopped (05/10/21 2242)   albumin human  Stopped (05/12/21 1037)   DOPamine Stopped (05/11/21 2206)   epinephrine 20 mcg/min (05/12/21 1300)   feeding supplement (PIVOT 1.5 CAL) Stopped (05/10/21 2054)   HYDROmorphone 0.5 mg/hr (05/11/21 1915)   Immune Globulin 10%     levETIRAcetam 1,000 mg (05/12/21 1202)   linezolid (ZYVOX) IV Stopped (05/12/21 1155)   norepinephrine (LEVOPHED) Adult infusion 21 mcg/min (05/12/21 1300)   pencillin G potassium IV 3 Million Units (05/12/21 1331)   prismasol BGK 2/2.5 dialysis solution 1,500 mL/hr at 05/12/21 0800   prismasol BGK 2/2.5 replacement solution 500 mL/hr at 05/12/21 1121   prismasol BGK 2/2.5 replacement solution 500 mL/hr at 05/12/21 0131   vasopressin 0.04 Units/min (05/12/21 1300)   PRN Meds: docusate sodium, heparin, HYDROmorphone, lip balm, midazolam, ondansetron (ZOFRAN) IV, oxyCODONE, sennosides, sodium chloride, sodium chloride flush   Vital Signs    Vitals:   05/12/21 1245 05/12/21 1300 05/12/21 1315 05/12/21 1324  BP: (!) 145/50 (!) 142/46 (!) 142/47   Pulse:      Resp: '16 17 19   ' Temp: 97.9 F (36.6 C) 98.1 F (36.7 C) 98.1 F (36.7 C)   TempSrc: Esophageal Esophageal Esophageal   SpO2: 97% 95% 94% 94%  Weight:      Height:        Intake/Output Summary (Last 24 hours) at 05/12/2021 1332 Last data filed at 05/12/2021 1300 Gross per 24 hour  Intake 9524.87 ml  Output 6676 ml  Net 2848.87 ml  Last 3 Weights 05/12/2021 05/11/2021 05/05/2021  Weight (lbs) 287 lb 7.7 oz 282 lb 13.6 oz 272 lb 0.8 oz  Weight (kg) 130.4 kg 128.3 kg 123.4 kg      Telemetry    SR, HR60's>>110s - personally reviewed   ECG     Physical Exam   Well developed and nourished intubated and sedated HENT   Neck supple   Air movement laterally  reasonable Regular rate and rhythm,   Abd-soft with active BS No Clubbing cyanosis no edema Skin-warm and dry A & Oriented  Grossly normal sensory and motor function       Labs    High Sensitivity Troponin:  No results for  input(s): TROPONINIHS in the last 720 hours.   Chemistry Recent Labs  Lab 05/11/21 0531 05/11/21 1600 05/11/21 2052 05/12/21 0431 05/12/21 1025  NA 137 135 136 132* 132*  K 5.4* 4.5 4.6 4.1 4.4  CL 98 96* 95* 98 94*  CO2 15* '22 25 25 27  ' GLUCOSE 368* 204* 123* 143* 155*  BUN 92* 89* 72* 53* 50*  CREATININE 4.21* 4.00* 3.28* 2.38* 2.29*  CALCIUM 6.8* 5.5* 6.7* 6.3* 7.6*  MG 2.9*  --  2.2  --  2.1  PROT 4.6*  --   --  5.3* 6.3*  ALBUMIN <1.5* <1.5*  --  2.1*   2.0* 2.5*  AST 4,359*  --   --  6,519* 6,250*  ALT 1,746*  --   --  1,613* 1,711*  ALKPHOS 185*  --   --  232* 262*  BILITOT 2.5*  --   --  3.0* 3.9*  GFRNONAA 11* 11* 14* 21* 22*  ANIONGAP 24* 17* 16* 9 11     Lipids  Recent Labs  Lab 05/11/21 0531  TRIG 145     Hematology Recent Labs  Lab 05/11/21 0040 05/11/21 0531 05/11/21 1239 05/11/21 2052 05/12/21 1025  WBC 42.7* 42.8*  --   --  40.8*  RBC 3.52* 3.76*  --   --  2.86*  HGB 9.7* 10.8* 9.9* 9.9* 8.2*  HCT 32.2* 33.2* 29.8* 30.1* 24.5*  MCV 91.5 88.3  --   --  85.7  MCH 27.6 28.7  --   --  28.7  MCHC 30.1 32.5  --   --  33.5  RDW 18.5* 18.6*  --   --  19.2*  PLT 208 219  --   --  187    Thyroid No results for input(s): TSH, FREET4 in the last 168 hours.  BNPNo results for input(s): BNP, PROBNP in the last 168 hours.  DDimer  No results for input(s): DDIMER in the last 168 hours.    Radiology    DG Abd 1 View  Result Date: 05/11/2021 CLINICAL DATA:  NG tube placement. EXAM: ABDOMEN - 1 VIEW COMPARISON:  None. FINDINGS: Nasal/orogastric tube passes below the diaphragm, well into the stomach, tip below the included field of view. Endotracheal tube tip projects 3 cm above the carina. IMPRESSION: 1. Well-positioned nasal/orogastric tube. Electronically Signed   By: Lajean Manes M.D.   On: 05/11/2021 11:33   DG Chest Port 1 View  Result Date: 05/11/2021 CLINICAL DATA:  Central line placement. EXAM: PORTABLE CHEST 1 VIEW COMPARISON:  05/10/2021 and  earlier exams. FINDINGS: New left internal jugular central venous line. Catheter tip projects in the mid superior vena cava at its confluence with the left brachiocephalic vein. Right internal jugular central venous line, endotracheal tube and nasal/orogastric tube are stable in  well positioned. Mild increase in lung base opacities compared to the previous day's exam, consistent with atelectasis accentuated by low lung volumes and patient rotation. Remainder of the lungs is clear. No convincing pneumothorax. IMPRESSION: 1. New left internal jugular central venous catheter, tip projecting in the mid superior vena cava. 2. No pneumothorax. 3. Mild increase in lung base opacities consistent with atelectasis. No other change. Electronically Signed   By: Lajean Manes M.D.   On: 05/11/2021 12:56   DG Chest Port 1 View  Result Date: 05/10/2021 CLINICAL DATA:  Post intubation, OG tube EXAM: PORTABLE CHEST 1 VIEW COMPARISON:  05/10/2021 at 1906 hours FINDINGS: Lungs are essentially clear.  No pleural effusion or pneumothorax. Endotracheal tube terminates 3 cm above the carina. Enteric tube courses into the stomach. Right IJ venous catheter terminates at the cavoatrial junction. Defibrillator pads overlying the chest. IMPRESSION: Endotracheal tube terminates 3 cm above the carina. Enteric tube courses into the stomach. Electronically Signed   By: Julian Hy M.D.   On: 05/10/2021 21:29   DG Chest Port 1 View  Result Date: 05/10/2021 CLINICAL DATA:  Short of breath EXAM: PORTABLE CHEST 1 VIEW COMPARISON:  05/07/2021 FINDINGS: Single frontal view of the chest demonstrates stable right internal jugular catheter. Endotracheal tube is been removed. Cardiac silhouette is unremarkable. No airspace disease, effusion, or pneumothorax. Minimal linear opacities at the lung bases consistent with scarring or subsegmental atelectasis. No acute bony abnormalities. IMPRESSION: 1. Bibasilar hypoventilatory change.  No acute  process. Electronically Signed   By: Randa Ngo M.D.   On: 05/10/2021 19:13    Cardiac Studies     2D Echocardiogram 01.23.2023    1. Left ventricular ejection fraction, by estimation, is 50 to 55%. The  left ventricle has low normal function. Left ventricular endocardial  border not optimally defined to evaluate regional wall motion. There is  mild left ventricular hypertrophy. Left  ventricular diastolic parameters are indeterminate.   2. Right ventricular systolic function is normal. The right ventricular  size is normal. Tricuspid regurgitation signal is inadequate for assessing  PA pressure.   3. Left atrial size was mildly dilated.   4. Right atrial size was mildly dilated.   5. The mitral valve is normal in structure. No evidence of mitral valve  regurgitation. No evidence of mitral stenosis.   6. The aortic valve is normal in structure. Aortic valve regurgitation is  not visualized. Aortic valve sclerosis/calcification is present, without  any evidence of aortic stenosis.   7. challenging image quality.    Assessment & Plan    Afib RVR    Septic shock s/p s. Pyogenes from necrotizing fasciitis Leg  requiring pressors  Respiratory failure intbuated   AKI/ATN on CRRT   Anemia  Somewhat improved overnight but still on very heavy pressor and ventilatory and now renal support  No family  No Afib  as noted previously in Afib recurs and is hemodynamically compromising would use DCCV and not amio       For questions or updates, please contact [facility] Please consult [email or web address] for contact info under        Signed, Virl Axe, MD  05/12/2021, 1:32 PM

## 2021-05-12 NOTE — Progress Notes (Signed)
Patient had an eventful day.  B/P patient was intermittently responsive to touch and voice.  Last BM charted was 1/22 and Dr. Patsey Berthold notified, as patient was not receiving anything down OG tube due to previously needing to be in trendelenburg position due to hypotension and patient now is able to tolerate a semi-fowler position, Dr. Patsey Berthold ok for patient to receive scheduled stool softeners via OG tube along with other medications and water flushes, tube feeds to still be held at this time.  Throughout the shift the patient has been steadly weaned down from Levo and maintaining B/P, towards end of the shift Levo was turned off.  Patient had a coughing episode not resolved with ETT and oral suctioning, patient hooked up to LIS via OG tube and had output of 80mL and has been stable since.  Will continue to monitor and will endorse to oncoming shift.

## 2021-05-12 NOTE — Progress Notes (Signed)
Patient ID: Alexandra Foster, female   DOB: Feb 23, 1951, 71 y.o.   MRN: [id number]     [facility] Day(s): 8.   Interval History: Patient seen and examined, no acute events or new complaints overnight.  Patient evaluated today and continue critically ill, sedated on mechanical ventilation.  As per nurses no issues with wound VAC since last evaluation yesterday.  Vital signs in last 24 hours: [min-max] current  Temp:  [96.1 F (35.6 C)-100.8 F (38.2 C)] 97.5 F (36.4 C) (01/29 0900) Pulse Rate:  [92-107] 92 (01/28 2100) Resp:  [16-24] 20 (01/29 0900) BP: (98-149)/(24-83) 145/47 (01/29 0900) SpO2:  [93 %-100 %] 98 % (01/29 0900) FiO2 (%):  [70 %] 70 % (01/29 0900) Weight:  [130.4 kg] 130.4 kg (01/29 0500)     Height: 5\' 5"  (165.1 cm) Weight: 130.4 kg BMI (Calculated): 47.84   Physical Exam:  Constitutional: Critically ill, sedated, limitation Extremity: Left leg wound covered with wound VAC system.  Soft and healthy food.  No sign of distal ischemia.  Labs:  CBC Latest Ref Rng & Units 05/11/2021 05/11/2021 05/11/2021  WBC 4.0 - 10.5 K/uL - - 42.8(H)  Hemoglobin 12.0 - 15.0 g/dL 9.9(L) 9.9(L) 10.8(L)  Hematocrit 36.0 - 46.0 % 30.1(L) 29.8(L) 33.2(L)  Platelets 150 - 400 K/uL - - 219   CMP Latest Ref Rng & Units 05/12/2021 05/11/2021 05/11/2021  Glucose 70 - 99 mg/dL 143(H) 123(H) 204(H)  BUN 8 - 23 mg/dL 53(H) 72(H) 89(H)  Creatinine 0.44 - 1.00 mg/dL 2.38(H) 3.28(H) 4.00(H)  Sodium 135 - 145 mmol/L 132(L) 136 135  Potassium 3.5 - 5.1 mmol/L 4.1 4.6 4.5  Chloride 98 - 111 mmol/L 98 95(L) 96(L)  CO2 22 - 32 mmol/L 25 25 22   Calcium 8.9 - 10.3 mg/dL 6.3(LL) 6.7(L) 5.5(LL)  Total Protein 6.5 - 8.1 g/dL 5.3(L) - -  Total Bilirubin 0.3 - 1.2 mg/dL 3.0(H) - -  Alkaline Phos 38 - 126 U/L 232(H) - -  AST 15 - 41 U/L 6,519(H) - -  ALT 0 - 44 U/L 1,613(H) - -    Imaging studies: No new pertinent imaging studies   Assessment/Plan:  71 y.o. female with left leg  necrotizing fasciitis with 3 Day Post-Op s/p debridement, complicated by pertinent comorbidities including strep pyogenes bacteremia now in septic shock, respiratory failure mechanical ventilation, COPD, type 2 diabetes mellitus, hypertension, obstructive sleep apnea, GERD   Left leg necrotizing fasciitis -S/p debridement 05/07/2021 -Second debridement on 05/10/2021.  Application of vera flow wound VAC system -Regular wound VAC therapy working adequately.  No leak.  We will keep regular wound VAC therapy.  We will try to change negative pressure dressing tomorrow. -Patient showed mild improvement in of her critical status.  Currently critically ill, and septic shock on vasopressors, respiratory failure on mechanical ventilation and acute liver failure.  Continue management by primary care team. 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 -I will continue to follow closely.  Arnold Long, MD

## 2021-05-12 NOTE — Progress Notes (Signed)
Calcium 6.3 , informed  NP

## 2021-05-13 ENCOUNTER — Encounter: Payer: Self-pay | Admitting: General Surgery

## 2021-05-13 DIAGNOSIS — A419 Sepsis, unspecified organism: Secondary | ICD-10-CM | POA: Diagnosis not present

## 2021-05-13 DIAGNOSIS — G9340 Encephalopathy, unspecified: Secondary | ICD-10-CM | POA: Diagnosis not present

## 2021-05-13 DIAGNOSIS — I4891 Unspecified atrial fibrillation: Secondary | ICD-10-CM | POA: Diagnosis not present

## 2021-05-13 DIAGNOSIS — R6521 Severe sepsis with septic shock: Secondary | ICD-10-CM | POA: Diagnosis not present

## 2021-05-13 DIAGNOSIS — A400 Sepsis due to streptococcus, group A: Secondary | ICD-10-CM | POA: Diagnosis not present

## 2021-05-13 DIAGNOSIS — I4892 Unspecified atrial flutter: Secondary | ICD-10-CM | POA: Diagnosis not present

## 2021-05-13 DIAGNOSIS — G253 Myoclonus: Secondary | ICD-10-CM | POA: Diagnosis not present

## 2021-05-13 LAB — SEDIMENTATION RATE: Sed Rate: 59 mm/hr — ABNORMAL HIGH (ref 0–22)

## 2021-05-13 LAB — RENAL FUNCTION PANEL
Albumin: 2.4 g/dL — ABNORMAL LOW (ref 3.5–5.0)
Albumin: 2.6 g/dL — ABNORMAL LOW (ref 3.5–5.0)
Anion gap: 7 (ref 5–15)
Anion gap: 11 (ref 5–15)
BUN: 36 mg/dL — ABNORMAL HIGH (ref 8–23)
BUN: 31 mg/dL — ABNORMAL HIGH (ref 8–23)
CO2: 26 mmol/L (ref 22–32)
CO2: 25 mmol/L (ref 22–32)
Calcium: 8.1 mg/dL — ABNORMAL LOW (ref 8.9–10.3)
Calcium: 8.8 mg/dL — ABNORMAL LOW (ref 8.9–10.3)
Chloride: 98 mmol/L (ref 98–111)
Chloride: 97 mmol/L — ABNORMAL LOW (ref 98–111)
Creatinine, Ser: 1.77 mg/dL — ABNORMAL HIGH (ref 0.44–1.00)
Creatinine, Ser: 1.56 mg/dL — ABNORMAL HIGH (ref 0.44–1.00)
GFR, Estimated: 30 mL/min — ABNORMAL LOW (ref >60)
GFR, Estimated: 35 mL/min — ABNORMAL LOW (ref >60)
Glucose, Bld: 159 mg/dL — ABNORMAL HIGH (ref 70–99)
Glucose, Bld: 146 mg/dL — ABNORMAL HIGH (ref 70–99)
Phosphorus: 3.4 mg/dL (ref 2.5–4.6)
Phosphorus: 2.8 mg/dL (ref 2.5–4.6)
Potassium: 4 mmol/L (ref 3.5–5.1)
Potassium: 3.9 mmol/L (ref 3.5–5.1)
Sodium: 131 mmol/L — ABNORMAL LOW (ref 135–145)
Sodium: 133 mmol/L — ABNORMAL LOW (ref 135–145)

## 2021-05-13 LAB — CBC
HCT: 22.2 % — ABNORMAL LOW (ref 36.0–46.0)
Hemoglobin: 7.3 g/dL — ABNORMAL LOW (ref 12.0–15.0)
MCH: 28.9 pg (ref 26.0–34.0)
MCHC: 32.9 g/dL (ref 30.0–36.0)
MCV: 87.7 fL (ref 80.0–100.0)
Platelets: 141 10*3/uL — ABNORMAL LOW (ref 150–400)
RBC: 2.53 MIL/uL — ABNORMAL LOW (ref 3.87–5.11)
RDW: 19.9 % — ABNORMAL HIGH (ref 11.5–15.5)
WBC: 34.8 10*3/uL — ABNORMAL HIGH (ref 4.0–10.5)
nRBC: 13.6 % — ABNORMAL HIGH (ref 0.0–0.2)

## 2021-05-13 LAB — PATHOLOGIST SMEAR REVIEW

## 2021-05-13 LAB — GLUCOSE, CAPILLARY
Glucose-Capillary: 155 mg/dL — ABNORMAL HIGH (ref 70–99)
Glucose-Capillary: 142 mg/dL — ABNORMAL HIGH (ref 70–99)
Glucose-Capillary: 165 mg/dL — ABNORMAL HIGH (ref 70–99)
Glucose-Capillary: 146 mg/dL — ABNORMAL HIGH (ref 70–99)
Glucose-Capillary: 152 mg/dL — ABNORMAL HIGH (ref 70–99)
Glucose-Capillary: 172 mg/dL — ABNORMAL HIGH (ref 70–99)

## 2021-05-13 LAB — AEROBIC/ANAEROBIC CULTURE W GRAM STAIN (SURGICAL/DEEP WOUND): Culture: NO GROWTH

## 2021-05-13 LAB — HEPATIC FUNCTION PANEL
ALT: 1400 U/L — ABNORMAL HIGH (ref 0–44)
AST: 4114 U/L — ABNORMAL HIGH (ref 15–41)
Albumin: 2.4 g/dL — ABNORMAL LOW (ref 3.5–5.0)
Alkaline Phosphatase: 266 U/L — ABNORMAL HIGH (ref 38–126)
Bilirubin, Direct: 2.7 mg/dL — ABNORMAL HIGH (ref 0.0–0.2)
Indirect Bilirubin: 1.3 mg/dL — ABNORMAL HIGH (ref 0.3–0.9)
Total Bilirubin: 4 mg/dL — ABNORMAL HIGH (ref 0.3–1.2)
Total Protein: 5.9 g/dL — ABNORMAL LOW (ref 6.5–8.1)

## 2021-05-13 LAB — TSH: TSH: 0.196 u[IU]/mL — ABNORMAL LOW (ref 0.350–4.500)

## 2021-05-13 LAB — HEMOGLOBIN AND HEMATOCRIT, BLOOD
HCT: 22.4 % — ABNORMAL LOW (ref 36.0–46.0)
Hemoglobin: 7.6 g/dL — ABNORMAL LOW (ref 12.0–15.0)

## 2021-05-13 LAB — MAGNESIUM: Magnesium: 1.8 mg/dL (ref 1.7–2.4)

## 2021-05-13 LAB — PROCALCITONIN: Procalcitonin: 20.94 ng/mL

## 2021-05-13 MED ORDER — PIVOT 1.5 CAL PO LIQD
1000.0000 mL | ORAL | Status: AC
  Administered 2021-05-13: 1000 mL
  Filled 2021-05-13: qty 1000

## 2021-05-13 MED ORDER — ALBUMIN HUMAN 25 % IV SOLN
25.0000 g | Freq: Four times a day (QID) | INTRAVENOUS | Status: AC
  Administered 2021-05-13 – 2021-05-14 (×4): 25 g via INTRAVENOUS
  Filled 2021-05-13 (×3): qty 100

## 2021-05-13 MED ORDER — FREE WATER
30.0000 mL | Status: AC
  Administered 2021-05-13 – 2021-05-21 (×46): 30 mL

## 2021-05-13 NOTE — Consult Note (Signed)
PHARMACY CONSULT NOTE - FOLLOW UP  Pharmacy Consult for Electrolyte Monitoring and Replacement   Recent Labs: Potassium (mmol/L)  Date Value  05/13/2021 4.0  08/11/2013 3.3 (L)   Magnesium (mg/dL)  Date Value  05/13/2021 1.8   Calcium (mg/dL)  Date Value  05/13/2021 8.1 (L)   Calcium, Total (mg/dL)  Date Value  08/11/2013 9.2   Albumin (g/dL)  Date Value  05/13/2021 2.4 (L)  05/13/2021 2.4 (L)  08/11/2013 3.1 (L)   Phosphorus (mg/dL)  Date Value  05/13/2021 3.4   Sodium (mmol/L)  Date Value  05/13/2021 131 (L)  08/11/2013 138   Assessment: 71 yo female with a previous medical history of diabetes mellitus type 2, hypertension, obstructive sleep apnea on CPAP, asthma/COPD, GERD, history of COVID-19 pneumonia presenting with lower left extremity pain and chills. Patient found to have severe sepsis and strep pyogenes bacteremia on admission and is currently intubated and sedated. Pharmacy has been consulted to monitor and replace electrolytes.  Nutrition:  --Feeds per tube --Free water per tube 30 mL Q4H  Goal of Therapy:  Electrolytes within normal limits  Plan:  --Mild hyponatremia Na 131, nephrology following  --Repeat IV albumin 25g Q6H --Follow-up with AM labs  [facility] 05/13/2021 7:49 AM

## 2021-05-13 NOTE — Progress Notes (Signed)
Date of Admission:  05/04/2021    ID: Alexandra Foster is a 71 y.o. female  Principal Problem:   Severe sepsis with septic shock (CODE) ([place]) Active Problems:   COPD (chronic obstructive pulmonary disease) (HCC)   Morbid obesity ([place])   Diabetes mellitus type 2 in obese [facility]   Atrial fibrillation and flutter (HCC)   Necrotizing fasciitis ([place])   Acute on chronic respiratory failure ([place])   On mechanically assisted ventilation ([place])   Shock liver   Anemia associated with acute blood loss   Metabolic acidosis   Encounter for continuous renal replacement therapy (CRRT) for acute renal failure (HCC)   Acute encephalopathy   Group A streptococcal infection   Streptococcal toxic shock syndrome (HCC)    Subjective:  Remains intubated Pt deteriorated over the weekend Had 2nd surgery on Friday Dropped Hb, hypotensive worsening AKI, shock liver Had to go on CRRT on 05/11/21,  I discussed with Dr.Gonzalez over the weekend and started on IVIG Had twitching of the rt eye and rt shoulder like  and was started on keppra EEG triphasic waves , no epileptiform discharges  Objective: Vital signs in last 24 hours:Patient Vitals for the past 24 hrs:  BP Temp Temp src Pulse Resp SpO2 Weight  05/13/21 2021 -- -- -- -- -- 95 % --  05/13/21 2020 -- -- -- -- -- 94 % --  05/13/21 2000 (!) 120/45 97.7 F (36.5 C) Esophageal 83 15 93 % --  05/13/21 1900 (!) 108/52 (!) 97.5 F (36.4 C) Esophageal 84 18 91 % --  05/13/21 1845 (!) 118/46 (!) 97.5 F (36.4 C) Esophageal 85 20 90 % --  05/13/21 1834 -- 97.7 F (36.5 C) Esophageal 92 15 91 % --  05/13/21 1830 (!) 108/48 97.9 F (36.6 C) Esophageal 86 (!) 24 92 % --  05/13/21 1815 (!) 147/57 97.9 F (36.6 C) Esophageal 92 16 (!) 89 % --  05/13/21 1800 121/80 97.7 F (36.5 C) Esophageal 91 15 93 % --  05/13/21 1745 (!) 113/43 (!) 97.5 F (36.4 C) -- 84 17 92 % --  05/13/21 1730 (!) 119/45 (!) 97.3 F (36.3 C) Esophageal 85 20 93 % --   05/13/21 1715 (!) 114/47 (!) 97.3 F (36.3 C) Esophageal 79 19 92 % --  05/13/21 1700 129/60 (!) 96.8 F (36 C) Esophageal 79 (!) 23 92 % --  05/13/21 1645 (!) 110/43 (!) 97.2 F (36.2 C) Esophageal 87 (!) 23 94 % --  05/13/21 1630 (!) 115/46 (!) 97.2 F (36.2 C) Esophageal 77 (!) 24 98 % --  05/13/21 1615 (!) 117/45 (!) 97.2 F (36.2 C) Esophageal 75 (!) 21 100 % --  05/13/21 1600 (!) 125/47 (!) 97.2 F (36.2 C) Esophageal 81 (!) 22 100 % --  05/13/21 1545 (!) 134/55 (!) 97.3 F (36.3 C) Esophageal 88 (!) 21 93 % --  05/13/21 1532 -- (!) 97.3 F (36.3 C) Esophageal 80 (!) 23 95 % --  05/13/21 1530 (!) 113/52 (!) 97.3 F (36.3 C) Esophageal 80 20 94 % --  05/13/21 1515 (!) 117/50 (!) 97.3 F (36.3 C) Esophageal 82 (!) 24 94 % --  05/13/21 1500 (!) 125/49 (!) 97.5 F (36.4 C) Esophageal 82 (!) 25 94 % --  05/13/21 1430 (!) 122/47 97.9 F (36.6 C) Esophageal 93 13 92 % --  05/13/21 1415 (!) 158/64 98.2 F (36.8 C) Esophageal 93 (!) 26 94 % --  05/13/21 1403 -- 98.1 F (36.7  C) Esophageal 90 (!) 21 91 % --  05/13/21 1400 (!) 141/50 98.2 F (36.8 C) Esophageal 87 18 92 % --  05/13/21 1345 (!) 110/48 98.1 F (36.7 C) Esophageal 83 (!) 21 91 % --  05/13/21 1330 (!) 122/51 98.1 F (36.7 C) Esophageal 81 (!) 24 91 % --  05/13/21 1315 (!) 116/47 98.1 F (36.7 C) Esophageal 87 20 91 % --  05/13/21 1301 (!) 121/46 98.1 F (36.7 C) Esophageal 84 (!) 22 91 % --  05/13/21 1300 (!) 121/46 98.1 F (36.7 C) Esophageal 84 18 91 % --  05/13/21 1259 -- 98.1 F (36.7 C) Esophageal 88 (!) 21 (!) 89 % --  05/13/21 1258 -- 98.1 F (36.7 C) Esophageal 84 17 (!) 89 % --  05/13/21 1257 -- 98.1 F (36.7 C) Esophageal 83 (!) 25 (!) 89 % --  05/13/21 1256 -- 98.1 F (36.7 C) -- 86 (!) 23 (!) 89 % --  05/13/21 1255 (!) 126/49 98.1 F (36.7 C) -- 85 (!) 24 (!) 89 % --  05/13/21 1254 -- 98.1 F (36.7 C) -- 89 (!) 29 (!) 89 % --  05/13/21 1253 -- 98.1 F (36.7 C) -- 88 (!) 25 (!) 89 % --   05/13/21 1252 -- 98.2 F (36.8 C) -- 88 18 92 % --  05/13/21 1251 -- 98.1 F (36.7 C) -- 85 20 (!) 89 % --  05/13/21 1250 -- 97.9 F (36.6 C) Esophageal 86 17 90 % --  05/13/21 1249 -- 97.9 F (36.6 C) Esophageal 84 19 (!) 89 % --  05/13/21 1248 -- 97.9 F (36.6 C) Esophageal 88 20 (!) 89 % --  05/13/21 1247 -- 97.7 F (36.5 C) Esophageal 86 19 90 % --  05/13/21 1246 -- (!) 97.5 F (36.4 C) Esophageal 85 (!) 24 90 % --  05/13/21 1245 (!) 128/46 (!) 97.3 F (36.3 C) Esophageal 84 18 92 % --  05/13/21 1230 (!) 125/50 98.1 F (36.7 C) Esophageal 86 (!) 22 91 % --  05/13/21 1215 (!) 134/55 98.1 F (36.7 C) Esophageal 89 18 93 % --  05/13/21 1200 (!) 117/49 97.9 F (36.6 C) Esophageal 83 (!) 22 91 % --  05/13/21 1145 (!) 119/52 98.1 F (36.7 C) Esophageal 85 (!) 22 91 % --  05/13/21 1130 (!) 128/43 97.9 F (36.6 C) Esophageal 87 17 91 % --  05/13/21 1115 (!) 128/40 97.7 F (36.5 C) Esophageal 86 (!) 22 92 % --  05/13/21 1100 (!) 126/47 97.7 F (36.5 C) Esophageal 90 (!) 24 91 % --  05/13/21 1045 (!) 125/49 (!) 96.8 F (36 C) Esophageal 86 (!) 23 92 % --  05/13/21 1030 (!) 126/49 97.7 F (36.5 C) Esophageal 88 16 93 % --  05/13/21 1015 (!) 118/46 (!) 97.5 F (36.4 C) Esophageal 81 20 92 % --  05/13/21 1000 (!) 117/44 (!) 97.5 F (36.4 C) Esophageal 83 20 92 % --  05/13/21 0945 (!) 127/52 (!) 97.5 F (36.4 C) Esophageal 87 14 93 % --  05/13/21 0936 -- -- -- -- -- 93 % --  05/13/21 0934 -- 97.7 F (36.5 C) Esophageal 88 11 (!) 88 % --  05/13/21 0930 (!) 115/49 (!) 97.5 F (36.4 C) Esophageal 83 13 91 % --  05/13/21 0915 (!) 118/50 (!) 97.5 F (36.4 C) Esophageal 86 12 90 % --  05/13/21 0900 (!) 118/41 (!) 97.5 F (36.4 C) Esophageal 85 13 92 % --  05/13/21 0845 [name])  123/49 97.7 F (36.5 C) Esophageal 87 14 91 % --  05/13/21 0830 (!) 113/40 (!) 97.5 F (36.4 C) Esophageal 84 12 93 % --  05/13/21 0815 (!) 128/50 97.7 F (36.5 C) Esophageal 92 12 93 % --  05/13/21 0800  (!) 133/47 97.7 F (36.5 C) Esophageal 86 12 94 % --  05/13/21 0757 -- -- -- -- -- 94 % --  05/13/21 0745 (!) 133/49 (!) 97.5 F (36.4 C) Esophageal 89 12 92 % --  05/13/21 0730 -- (!) 97.5 F (36.4 C) Esophageal 86 13 95 % --  05/13/21 0728 (!) 147/56 (!) 97.5 F (36.4 C) Esophageal 84 13 96 % --  05/13/21 0715 (!) 120/45 (!) 97.3 F (36.3 C) Esophageal 82 19 95 % --  05/13/21 0700 (!) 126/45 (!) 97.3 F (36.3 C) Esophageal 86 18 96 % --  05/13/21 0645 (!) 120/45 (!) 97.3 F (36.3 C) Esophageal 84 16 96 % --  05/13/21 0615 (!) 134/51 (!) 97.5 F (36.4 C) -- 90 13 95 % --  05/13/21 0600 (!) 121/45 (!) 97.3 F (36.3 C) -- 83 20 97 % --  05/13/21 0545 (!) 128/48 (!) 97.3 F (36.3 C) -- 84 (!) 27 97 % --  05/13/21 0530 (!) 125/52 (!) 97.3 F (36.3 C) -- 85 15 96 % --  05/13/21 0515 (!) 123/47 (!) 97.3 F (36.3 C) -- 83 (!) 22 97 % --  05/13/21 0500 (!) 127/51 (!) 97.5 F (36.4 C) -- 85 (!) 26 97 % --  05/13/21 0445 (!) 124/46 (!) 97.5 F (36.4 C) -- 85 14 95 % 130.7 kg  05/13/21 0430 (!) 127/51 97.7 F (36.5 C) -- 87 13 93 % --  05/13/21 0415 (!) 129/49 97.7 F (36.5 C) -- 88 13 93 % --  05/13/21 0400 (!) 125/47 97.7 F (36.5 C) -- 86 14 92 % --  05/13/21 0345 (!) 127/46 97.7 F (36.5 C) -- 86 (!) 27 93 % --  05/13/21 0330 (!) 127/51 97.9 F (36.6 C) -- 87 16 99 % --  05/13/21 0315 (!) 119/40 97.7 F (36.5 C) -- 86 (!) 21 99 % --  05/13/21 0300 (!) 124/45 97.7 F (36.5 C) -- 85 20 97 % --  05/13/21 0245 -- (!) 97.5 F (36.4 C) -- 82 (!) 21 99 % --  05/13/21 0230 (!) 120/44 (!) 97.5 F (36.4 C) -- 82 20 99 % --  05/13/21 0215 (!) 119/44 (!) 97.5 F (36.4 C) -- 82 20 98 % --  05/13/21 0200 (!) 120/43 (!) 97.5 F (36.4 C) -- 82 19 99 % --  05/13/21 0145 (!) 120/43 97.7 F (36.5 C) -- 83 19 100 % --  05/13/21 0130 (!) 122/44 (!) 97.5 F (36.4 C) -- 82 (!) 26 100 % --  05/13/21 0115 (!) 126/44 97.7 F (36.5 C) -- 84 20 99 % --  05/13/21 0100 (!) 128/46 (!) 97.5 F  (36.4 C) -- 83 (!) 22 100 % --  05/13/21 0045 (!) 126/47 (!) 97.5 F (36.4 C) -- 82 (!) 22 99 % --  05/13/21 0030 (!) 126/48 (!) 97.5 F (36.4 C) -- 77 19 100 % --  05/13/21 0015 (!) 128/48 (!) 97.5 F (36.4 C) -- 83 19 99 % --  05/13/21 0000 (!) 135/48 (!) 97.5 F (36.4 C) -- 86 18 100 % --  05/12/21 2345 (!) 127/47 (!) 97.5 F (36.4 C) -- 83 19 98 % --  05/12/21 2330 [name])  128/49 (!) 97.5 F (36.4 C) -- 85 19 100 % --  05/12/21 2315 (!) 130/49 (!) 97.5 F (36.4 C) -- 84 (!) 24 99 % --  05/12/21 2300 (!) 133/50 (!) 97.5 F (36.4 C) -- 85 (!) 25 98 % --  05/12/21 2245 (!) 128/48 (!) 97.5 F (36.4 C) -- 84 (!) 23 98 % --  05/12/21 2230 (!) 130/47 (!) 97.5 F (36.4 C) -- 84 (!) 21 98 % --  05/12/21 2215 (!) 136/52 97.7 F (36.5 C) -- 86 (!) 23 98 % --  05/12/21 2200 (!) 132/44 (!) 97.5 F (36.4 C) -- 83 18 98 % --  05/12/21 2145 (!) 135/49 (!) 97.5 F (36.4 C) -- 84 (!) 23 99 % --   PHYSICAL EXAM:  General: intubated, on 2 pressors Neck:RT IJ central line  Left IJ HD cath Lungs: b/l air entry Heart: s1s2 , sinus   Abdomen: Soft, non-tender,not distended. Bowel sounds normal. No masses Foley  Extremities:left leg   wound vac Anasarca. Neurologic: cannot be assessed  Lab Results CBC Latest Ref Rng & Units 05/13/2021 05/13/2021 05/12/2021  WBC 4.0 - 10.5 K/uL - 34.8(H) 40.8(H)  Hemoglobin 12.0 - 15.0 g/dL 7.6(L) 7.3(L) 8.2(L)  Hematocrit 36.0 - 46.0 % 22.4(L) 22.2(L) 24.5(L)  Platelets 150 - 400 K/uL - 141(L) 187    CMP Latest Ref Rng & Units 05/13/2021 05/13/2021 05/12/2021  Glucose 70 - 99 mg/dL 146(H) 159(H) 162(H)  BUN 8 - 23 mg/dL 31(H) 36(H) 44(H)  Creatinine 0.44 - 1.00 mg/dL 1.56(H) 1.77(H) 2.04(H)  Sodium 135 - 145 mmol/L 133(L) 131(L) 132(L)  Potassium 3.5 - 5.1 mmol/L 3.9 4.0 4.4  Chloride 98 - 111 mmol/L 97(L) 98 94(L)  CO2 22 - 32 mmol/L 25 26 26   Calcium 8.9 - 10.3 mg/dL 8.8(L) 8.1(L) 7.8(L)  Total Protein 6.5 - 8.1 g/dL - 5.9(L) -  Total Bilirubin 0.3 -  1.2 mg/dL - 4.0(H) -  Alkaline Phos 38 - 126 U/L - 266(H) -  AST 15 - 41 U/L - 4,114(H) -  ALT 0 - 44 U/L - 1,400(H) -     Microbiology: 05/04/21 BC Group A strep 4/4 1of 4 staph epidermidis- a contaminant 05/07/21- Wound culture NG 05/09/21 BC-NG  Assessment/Plan:  Streptococcus pyogenes bacteremia with severe sepsis with shock, multiorgan failure  left leg necrotizing celluliits>fascitis S/p debridement  X2 - 05/07/21 and 05/10/21   Pt is currently on penicillin and linezolid  and IVIG ( 3r d dose)  On 2 pressors Leucocytosis -  trending down today- could be from stress dose steroids and also reactive to Hb drop   AKI-  - hypovolemic shock and septic shock Now on CRRT   Anemia- got PRBC  Shock liver with increasing transaminitis and bilirubin Severe hypoalbuminemia Third spacing- getting Albumin'  DM on sliding scale  Afib now in sinus rhythm-   Intensivist, nephrologist surgeon on board  Discussed the management with her nurse and intensivist

## 2021-05-13 NOTE — Progress Notes (Addendum)
Nutrition Follow-up  DOCUMENTATION CODES:   Morbid obesity  INTERVENTION:   -D/c MVI with minerals daily -Renal MVI daily via tube -Continue 500 mg vitamin C BID via tube -Continue 220 mg zinc daily via tube -Initiate trickle feeds of Pivot 1.5 @ 20 ml/hr via OGT  30 ml free water flush every 6 hours   Tube feeding regimen provides 720 kcal (58% of needs), 45 grams of protein, and 360 ml of H2O. Total free water: 540 ml daily  Recommend increasing feed by 10 ml every 4 hours to goal rate of 35 ml/hr.   90 ml Prosource TF TID.    Tube feeding regimen provides 1500 kcal (100% of needs), 145 grams of protein, and 702 ml of H2O.  Total free water: 882 ml daily  NUTRITION DIAGNOSIS:   Increased nutrient needs related to wound healing as evidenced by estimated needs.  Ongoing  GOAL:   Patient will meet greater than or equal to 90% of their needs  Progressing   MONITOR:   PO intake, Supplement acceptance, Labs, Weight trends, Skin, I & O's  REASON FOR ASSESSMENT:   Rounds    ASSESSMENT:   71 y.o. female with a history of asthma/COPD, HTN, DMII, obesity, OSA on CPAP, GERD and COVID-19 pneumonia who was admitted 1/22 w/ left LE cellulitis, septic shock, S pyogenes bacteremia, hypotension, new Afib and AKI.  1/24- s/p I&D of lt leg 1/27- s/p PROCEDURE(S):  1.) Sharp excisional debridement of left leg 2.) Veraflow (negative pressure dressing (DME) placement); NGT placed for enteral nutrition; intubated 1/28- CRRT initiated 1/30- IVIG started due to streptococcal toxic shock syndrome  Patient is currently intubated on ventilator support. OGT placement verified by x-ray.  MV: 9.4 L/min Temp (24hrs), Avg:97.6 F (36.4 C), Min:96.8 F (36 C), Max:98.2 F (36.8 C)  Reviewed I/O's: -1.2 L x 24 hours and +15.6 L since admission  UOP: 22 ml x 24 hours  NGT output: 110 ml x 24 hours  Drain output: 230 ml x 24 hours  Case discussed with RN, MD, and during ICU rounds.  Pt with bleeding from leg and mouth (where she bit her tongue). Pt is tolerating CRRT well; she is still not making urine but outputting 100 ml/hr via CRRT. Pt is +15 L.   Pt with possible seizure activity over the weekend and started on keppra.   Per MD, plan is OR vs bedside debridement vs amputation. Plan to start trickle feeds today.   Medications reviewed and include albumin, epinephrine, dilaudid, keppra, levophed, penicillin, and vasopressin.   Lab Results  Component Value Date   HGBA1C 6.7 (H) 05/05/2021   PTA DM medications are .   Labs reviewed: Na: 131, CBGS: [id number] (inpatient orders for glycemic control are 1-3 units insulin aspart every 4 hours and 38 units insulin detemir BID).    Diet Order:   Diet Order             Diet NPO time specified  Diet effective midnight                   EDUCATION NEEDS:   Education needs have been addressed  Skin:  Skin Assessment: Skin Integrity Issues: Skin Integrity Issues:: Wound VAC Wound Vac: closed lt leg  Last BM:  05/10/21  Height:   Ht Readings from Last 1 Encounters:  05/04/21 5\' 5"  (1.651 m)    Weight:   Wt Readings from Last 1 Encounters:  05/13/21 130.7 kg    Ideal  Body Weight:  56.8 kg  BMI:  Body mass index is 47.95 kg/m.  Estimated Nutritional Needs:   Kcal:  1250-1420  Protein:  130-145 grams  Fluid:  > 1.2 L    Loistine Chance, RD, LDN, [place] Registered Dietitian II Certified Diabetes Care and Education Specialist Please refer to [facility] for RD and/or RD on-call/weekend/after hours pager

## 2021-05-13 NOTE — Procedures (Signed)
Routine EEG Report  Alexandra Foster is a 71 y.o. female with a history of septic shock and encephalopathy who is undergoing an EEG to evaluate for seizures.  Report: This EEG was acquired with electrodes placed according to the International 10-20 electrode system (including Fp1, Fp2, F3, F4, C3, C4, P3, P4, O1, O2, T3, T4, T5, T6, A1, A2, Fz, Cz, Pz). The following electrodes were missing or displaced: none.  The best background was continuous at 3-4 Hz. This activity is reactive to stimulation. No sleep architecture was identified. There were frequent triphasic waves that did not appear epileptiform. There was no focal slowing. There were no interictal epileptiform discharges. There were no electrographic seizures identified. Photic stimulation and hyperventilation were not performed.   Impression and clinical correlation: This EEG was obtained while sedated on hydromorphone and is abnormal due to: - Severe diffuse slowing indicative of global cerebral dysfunction - Frequent triphasic waves indicative of metabolic encephalopathy  Epileptiform abnormalities were not seen during this recording.  Su Monks, MD [facility] [phone]  If 7pm- 7am, please page neurology on call as listed in [place].

## 2021-05-13 NOTE — Progress Notes (Signed)
NAME:  Alexandra Foster, MRN:  [id number], DOB:  1951-03-13, LOS: 9 ADMISSION DATE:  05/04/2021  BRIEF SYNOPSIS 71 y.o female with medical history significant of Asthma/COPD, T2DM, HTN, OSA on CPAP, GERD, COVID-19 pneumonia, and Bilateral lower extremity edema who presented to the ED with chief complaint of LLE pain and chills since Thursday.  Patient was given 30 cc/kg of fluids and started on broad-spectrum antibiotics for sepsis with septic shock. Patient remained hypotensive despite IVF boluses therefore was started on Levophed. PCCM consulted and patient was admitted to the ICU. She was taken to the OR by general surgery and left leg was debrided  for necrotizing fasciitis and a wound vac placed. Patient developed severe refractory shock pos-op requiring multiple pressors. She is intubated and on 0.7m infusion of dilaudid for pain and sedation  Subjective: Patient remains intubated. On epi at 212m, levo at 3137mand vasopressin at 0.4. She would open her eyes to voice and withdraw to pain. K+ trending down. Remains anuric. Seen by Dr. EscLorenso Courierom vascular this morning. States that she may take patient to the OR for a left AKA if she improves hemodynamically and if general surgery is okay.  Per Dr. GonPatsey Bertholdhe will be okay if patient can have left above-the-knee amputation as this may improve her outcome.  She remains on CRRT.  Patient had an episode of myoclonus last night and was loaded with Keppra.  No further seizure-like activity noted.  He has an EEG due today and she is also due for a neurology evaluation.  Nephrology is following.  No major changes to CRRT parameters this morning.  Per nursing staff, patient has not had a bowel movement since the 27th  Past Medical History     Arthritis   Asthma   COPD [place]ch[place] disease) ([place] Endometriosis   GERD (gastroesophageal reflux disease)   Hypertension   Obesity    Sleep apnea    CPAP    Significant Hospital Events   05/04/21: Admitted to the ICU with severe sepsis with shock secondary to cellulitis of LLE 1/23 off pressors +Afib with RVR 1/24 blood pressure low  1/24 to OR for fasciotomy 1/25 post op resp failure with severe septic shock 1/26 remains on pressors 1/27 returns to the OR today for debridement, wound VAC 1/28 overnight with hemodynamic deterioration, required reintubation, currently on pressors 1/29 remains on 3 pressors, CRRT, wound VAC and minimal sedation.  Awakens to voice and touch.  Seen by vascular surgery and awaiting input from general surgery regarding amputation of left leg 1/30 remains on vent remains on pressors   Significant Diagnostic Tests:  1/21: Chest Xray> no active cardiopulmonary process 1/21: CT left lower leg>Diffuse subcutaneous edema may represent cellulitis. No drainable fluid collection/abscess 1/21: Ultrasound lower unilateral left> no DVT   Micro Data:  1/21: SARS-CoV-2 PCR> negative 1/21: Influenza PCR> negative 1/21: Blood culture x2> Streptococcus pyogenes 1/21: Urine Culture> negative 1/21: MRSA PCR>> negative   Antimicrobials:  Vancomycin 1/21> off Cefepime 1/21> off Ceftriaxone 1/21 >> off Penicilli G 1/24>> Linezolid 1/25>>  *Antimicrobials per ID Interim History / Subjective:   Remains critically ill Patient with acute necrotizing fasciitis with emergent fasciotomy remains on pressors Prognosis is poor Returned to the OR 1/27 progressive hemodynamic instability after OR ?need for amputation Remains on CRRT  Objective   Blood pressure (!) 147/56, pulse 84, temperature (!) 97.5 F (36.4 C), temperature source Esophageal, resp.  rate 13, height 5' 5" (1.651 m), weight 130.7 kg, SpO2 94 %.    Vent Mode: PSV FiO2 (%):  [40 %-70 %] 40 % Set Rate:  [20 bmp] 20 bmp Vt Set:  [460 mL] 460 mL PEEP:  [5 cmH20] 5 cmH20 Pressure Support:  [5 cmH20] 5 cmH20 Plateau Pressure:  [17 cmH20] 17  cmH20   Intake/Output Summary (Last 24 hours) at 05/13/2021 0804 Last data filed at 05/13/2021 0800 Gross per 24 hour  Intake 3215.79 ml  Output 4497 ml  Net -1281.21 ml    Filed Weights   05/11/21 0500 05/12/21 0500 05/13/21 0445  Weight: 128.3 kg 130.4 kg 130.7 kg    REVIEW OF SYSTEMS  PATIENT IS UNABLE TO PROVIDE COMPLETE REVIEW OF SYSTEMS DUE TO SEVERE CRITICAL ILLNESS AND TOXIC METABOLIC ENCEPHALOPATHY    PHYSICAL EXAMINATION:  GENERAL:critically ill appearing, +resp distress EYES: Pupils equal, round, reactive to light.  No scleral icterus.  MOUTH: Moist mucosal membrane. INTUBATED NECK: Supple.  PULMONARY: +rhonchi, +wheezing CARDIOVASCULAR: S1 and S2.  No murmurs  GASTROINTESTINAL: Soft, nontender, +distended. Positive bowel sounds.  MUSCULOSKELETAL: No swelling, clubbing, or edema.  NEUROLOGIC: obtunded MUSCULOSKELETAL: Left lower extremity with wound VAC in place, surgery redressing.  Status post fasciotomy   Assessment & Plan   Admitted to the ICU with severe sepsis with shock secondary to cellulitis of LLE septic shock with LLL necrotizing fascitis post op resp failure and severe septic shock with progressive renal failure on CRRT and progressive multiorgan failure  Acute Hypoxic Respiratory Failure in the setting of worsening septic shock PMHx: COPD -Patient re-intubated due to increased lethargy and work of breathing; mains on full vent support with the following settings: PRVC  8 mL/kg, 100% FiO2, 5 PEEP, continue ventilator support & lung protective strategies  Severe ACUTE Hypoxic and Hypercapnic Respiratory Failure -continue Mechanical Ventilator support -Wean Fio2 and PEEP as tolerated -VAP/VENT bundle implementation - Wean PEEP & FiO2 as tolerated, maintain SpO2 > 88% - Head of bed elevated 30 degrees, VAP protocol in place - Plateau pressures less than 30 cm H20  - Intermittent chest x-ray & ABG PRN - Ensure adequate pulmonary hygiene  -will NOT  perform SAT/SBT until GEN surg decides on amputation    ACUTE KIDNEY INJURY/Renal Failure -continue Foley Catheter-assess need -Avoid nephrotoxic agents -Follow urine output, BMP -Ensure adequate renal perfusion, optimize oxygenation -Renal dose medications Remains on CRRT  Intake/Output Summary (Last 24 hours) at 05/13/2021 0808 Last data filed at 05/13/2021 0800 Gross per 24 hour  Intake 3215.79 ml  Output 4497 ml  Net -1281.21 ml   SEPTIC shock SOURCE-LLE Refractory Septic Shock secondary to LLE necrotizing fasciitis Streptococcal toxic shock syndrome -use vasopressors to keep MAP>65 as needed -follow ABG and LA as needed -follow up cultures  stress dose steroids -aggressive IV fluid Resuscitation S/p IVIG   Group A strep necrotizing fasciitis L LE Streptococcal toxic shock - Continue antibiotics: Penicillin G/linezolid - Status postdebridement, wound VAC per surgery - Poor prognosis -Vascular and general surgery following S/p IVIG   ACUTE ANEMIA- TRANSFUSE AS NEEDED CONSIDER TRANSFUSION  IF HGB<7 DVT PRX with TED/SCD's ONLY   ENDO - ICU hypoglycemic\Hyperglycemia protocol -check FSBS per protocol   GI GI PROPHYLAXIS as indicated  Transaminitis D/T shock liver - Supportive care - Monitor hepatic function  NUTRITIONAL STATUS DIET-->NPO Constipation protocol as indicated   ELECTROLYTES -follow labs as needed -replace as needed -pharmacy consultation and following    Acute encephalopathy with questionable myoclonic seizure activity  last night 05/11/21 DDx: Acute metabolic, query HIE given shock/massive resuscitation last PM -Toxic metabolic encephalopathy - Concern for toxemic/ischemic encephalopathy due to prolonged resuscitation - Continue supportive care - Loaded with keppra; continue at 1gram bid -EEG ordered and pending -Prn versed  -Neurology consulted; awaiting input    Best practice:  Diet: NPO Pain/Anxiety/Delirium protocol (if  indicated): Yes, initiated VAP protocol (if indicated): Yes, initiated DVT prophylaxis: Subcutaneous Heparin GI prophylaxis: PPI Glucose control: ICU hyperglycemia protocol, insulin drip discontinued; SQ insulin Central venous access:  Rt IJV Arterial line:  N/A Foley:  Yes Mobility:  bed rest  PT consulted: N/A Last date of multidisciplinary goals of care discussion [1/27] Code Status:  full code Disposition: ICU    Labs   CBC: Recent Labs  Lab 05/10/21 2040 05/11/21 0040 05/11/21 0531 05/11/21 1239 05/11/21 2052 05/12/21 1025 05/13/21 0311  WBC 40.9* 42.7* 42.8*  --   --  40.8* 34.8*  NEUTROABS  --   --  34.4*  --   --   --   --   HGB 4.9* 9.7* 10.8* 9.9* 9.9* 8.2* 7.3*  HCT 16.3* 32.2* 33.2* 29.8* 30.1* 24.5* 22.2*  MCV 86.7 91.5 88.3  --   --  85.7 87.7  PLT 267 208 219  --   --  187 141*     Basic Metabolic Panel: Recent Labs  Lab 05/10/21 2040 05/11/21 0040 05/11/21 0531 05/11/21 1600 05/11/21 2052 05/12/21 0431 05/12/21 1025 05/12/21 1603 05/13/21 0311  NA 138   < > 137   < > 136 132* 132* 132* 131*  K >7.5*   < > 5.4*   < > 4.6 4.1 4.4 4.4 4.0  CL 106   < > 98   < > 95* 98 94* 94* 98  CO2 <7*   < > 15*   < > _0 GLUCOSE 24*   < > 368*   < > 123* 143* 155* 162* 159*  BUN 86*   < > 92*   < > 72* 53* 50* 44* 36*  CREATININE 4.32*   < > 4.21*   < > 3.28* 2.38* 2.29* 2.04* 1.77*  CALCIUM 7.5*   < > 6.8*   < > 6.7* 6.3* 7.6* 7.8* 8.1*  MG 3.6*  --  2.9*  --  2.2  --  2.1  --  1.8  PHOS 17.5*  --  14.2*   < > 8.4* 6.1* 5.8* 4.9* 3.4   < > = values in this interval not displayed.    GFR: Estimated Creatinine Clearance: 39.2 mL/min (A) (by C-G formula based on SCr of 1.77 mg/dL (H)). Recent Labs  Lab 05/07/21 0821 05/07/21 1046 05/08/21 0450 05/11/21 0040 05/11/21 0531 05/12/21 1025 05/13/21 0311  PROCALCITON  --   --   --   --   --   --  20.94  WBC 21.9*  --    < > 42.7* 42.8* 40.8* 34.8*  LATICACIDVEN 1.7 1.7  --   --   --   --    --    < > = values in this interval not displayed.     Liver Function Tests: Recent Labs  Lab 05/10/21 0450 05/11/21 0531 05/11/21 1600 05/12/21 0431 05/12/21 1025 05/12/21 1603 05/13/21 0311  AST 97* 4,359*  --  6,519* 6,250*  --  4,114*  ALT 36 1,746*  --  1,613* 1,711*  --  1,400*  ALKPHOS 145* 185*  --  232*  262*  --  266*  BILITOT 1.8* 2.5*  --  3.0* 3.9*  --  4.0*  PROT 5.0* 4.6*  --  5.3* 6.3*  --  5.9*  ALBUMIN <1.5* <1.5* <1.5* 2.1*   2.0* 2.5* 2.7* 2.4*   2.4*    Coagulation Profile: No results for input(s): INR, PROTIME in the last 168 hours.   Cardiac Enzymes: Recent Labs  Lab 05/08/21 1313 05/12/21 1603  CKTOTAL 1,575* 4,670*     HbA1C: Hgb A1c MFr Bld  Date/Time Value Ref Range Status  05/05/2021 04:53 AM 6.7 (H) 4.8 - 5.6 % Final    Comment:    (NOTE)         Prediabetes: 5.7 - 6.4         Diabetes: >6.4         Glycemic control for adults with diabetes: <7.0   02/11/2021 09:41 AM 6.3 (H) <5.7 % of total Hgb Final    Comment:    For someone without known diabetes, a hemoglobin  A1c value between 5.7% and 6.4% is consistent with prediabetes and should be confirmed with a  follow-up test. . For someone with known diabetes, a value <7% indicates that their diabetes is well controlled. A1c targets should be individualized based on duration of diabetes, age, comorbid conditions, and other considerations. . This assay result is consistent with an increased risk of diabetes. . Currently, no consensus exists regarding use of hemoglobin A1c for diagnosis of diabetes for children. .     CBG: Recent Labs  Lab 05/12/21 1613 05/12/21 1945 05/12/21 2314 05/13/21 0428 05/13/21 0734  GLUCAP 152* 156* 176* 155* 142*    No Known Allergies   Medications  Scheduled Meds:  vitamin C  500 mg Oral BID   budesonide (PULMICORT) nebulizer solution  0.25 mg Nebulization BID   chlorhexidine gluconate (MEDLINE KIT)  15 mL Mouth Rinse BID    Chlorhexidine Gluconate Cloth  6 each Topical Q0600   docusate  100 mg Per Tube BID   free water  30 mL Per Tube Q4H   heparin injection (subcutaneous)  5,000 Units Subcutaneous Q8H   hydrocortisone sod succinate (SOLU-CORTEF) inj  100 mg Intravenous Q8H   insulin aspart  1-3 Units Subcutaneous Q4H   insulin detemir  38 Units Subcutaneous BID   ipratropium-albuterol  3 mL Nebulization Q6H   mouth rinse  15 mL Mouth Rinse 10 times per day   multivitamin with minerals  1 tablet Per Tube Daily   nutrition supplement (JUVEN)  1 packet Per Tube BID BM   pantoprazole (PROTONIX) IV  40 mg Intravenous Q24H   polyethylene glycol  17 g Per Tube Daily   sodium chloride flush  10-40 mL Intracatheter Q12H   zinc sulfate  220 mg Per Tube Daily   Continuous Infusions:  sodium chloride Stopped (05/10/21 2242)   epinephrine 4 mcg/min (05/13/21 0800)   feeding supplement (PIVOT 1.5 CAL) Stopped (05/10/21 2054)   HYDROmorphone 0.5 mg/hr (05/11/21 1915)   Immune Globulin 10% Stopped (05/12/21 2140)   levETIRAcetam Stopped (05/12/21 2150)   linezolid (ZYVOX) IV Stopped (05/12/21 2301)   norepinephrine (LEVOPHED) Adult infusion Stopped (05/12/21 1816)   pencillin G potassium IV 3 Million Units (05/13/21 0604)   prismasol BGK 2/2.5 dialysis solution 1,500 mL/hr at 05/13/21 0803   prismasol BGK 2/2.5 replacement solution 500 mL/hr at 05/13/21 0804   prismasol BGK 2/2.5 replacement solution 500 mL/hr at 05/13/21 0803   vasopressin 0.02 Units/min (05/13/21 0800)  PRN Meds:.docusate sodium, heparin, HYDROmorphone, lip balm, midazolam, ondansetron (ZOFRAN) IV, oxyCODONE, sennosides, sodium chloride, sodium chloride flush    DVT/GI PRX  assessed I Assessed the need for Labs I Assessed the need for Foley I Assessed the need for Central Venous Line Family Discussion when available I Assessed the need for Mobilization I made an Assessment of medications to be adjusted accordingly Safety Risk assessment  completed  CASE DISCUSSED IN MULTIDISCIPLINARY ROUNDS WITH ICU TEAM     Critical Care Time devoted to patient care services described in this note is 55 minutes.  Critical care was necessary to treat /prevent imminent and life-threatening deterioration. Overall, patient is critically ill, prognosis is guarded.  Patient with Multiorgan failure and at high risk for cardiac arrest and death.    Corrin Parker, M.D.  [facility]  Medical Director [place] Director [facility] Cardio-Pulmonary Department

## 2021-05-13 NOTE — Progress Notes (Signed)
Progress Note  Patient Name: Alexandra Foster Date of Encounter: 05/13/2021  Primary Cardiologist: Fletcher Anon  Subjective   She remains intubated and sedated with vasopressor support with vasopressin and epinephrine. No further evidence of Afib on tele.   Inpatient Medications    Scheduled Meds:  vitamin C  500 mg Oral BID   budesonide (PULMICORT) nebulizer solution  0.25 mg Nebulization BID   chlorhexidine gluconate (MEDLINE KIT)  15 mL Mouth Rinse BID   Chlorhexidine Gluconate Cloth  6 each Topical Q0600   docusate  100 mg Per Tube BID   free water  30 mL Per Tube Q4H   heparin injection (subcutaneous)  5,000 Units Subcutaneous Q8H   hydrocortisone sod succinate (SOLU-CORTEF) inj  100 mg Intravenous Q8H   insulin aspart  1-3 Units Subcutaneous Q4H   insulin detemir  38 Units Subcutaneous BID   ipratropium-albuterol  3 mL Nebulization Q6H   mouth rinse  15 mL Mouth Rinse 10 times per day   multivitamin with minerals  1 tablet Per Tube Daily   nutrition supplement (JUVEN)  1 packet Per Tube BID BM   pantoprazole (PROTONIX) IV  40 mg Intravenous Q24H   polyethylene glycol  17 g Per Tube Daily   sodium chloride flush  10-40 mL Intracatheter Q12H   zinc sulfate  220 mg Per Tube Daily   Continuous Infusions:  sodium chloride Stopped (05/10/21 2242)   epinephrine 4 mcg/min (05/13/21 0800)   feeding supplement (PIVOT 1.5 CAL) Stopped (05/10/21 2054)   HYDROmorphone 0.5 mg/hr (05/11/21 1915)   Immune Globulin 10% Stopped (05/12/21 2140)   levETIRAcetam Stopped (05/12/21 2150)   linezolid (ZYVOX) IV Stopped (05/12/21 2301)   norepinephrine (LEVOPHED) Adult infusion Stopped (05/12/21 1816)   pencillin G potassium IV 3 Million Units (05/13/21 0604)   prismasol BGK 2/2.5 dialysis solution 1,500 mL/hr at 05/13/21 0803   prismasol BGK 2/2.5 replacement solution 500 mL/hr at 05/13/21 0804   prismasol BGK 2/2.5 replacement solution 500 mL/hr at 05/13/21 0803   vasopressin 0.02  Units/min (05/13/21 0800)   PRN Meds: docusate sodium, heparin, HYDROmorphone, lip balm, midazolam, ondansetron (ZOFRAN) IV, oxyCODONE, sennosides, sodium chloride, sodium chloride flush   Vital Signs    Vitals:   05/13/21 0600 05/13/21 0615 05/13/21 0728 05/13/21 0757  BP: (!) 121/45 (!) 134/51 (!) 147/56   Pulse: 83 90 84   Resp: '20 13 13   ' Temp: (!) 97.3 F (36.3 C) (!) 97.5 F (36.4 C) (!) 97.5 F (36.4 C)   TempSrc:   Esophageal   SpO2: 97% 95% 96% 94%  Weight:      Height:        Intake/Output Summary (Last 24 hours) at 05/13/2021 0832 Last data filed at 05/13/2021 0800 Gross per 24 hour  Intake 3215.79 ml  Output 4497 ml  Net -1281.21 ml   Filed Weights   05/11/21 0500 05/12/21 0500 05/13/21 0445  Weight: 128.3 kg 130.4 kg 130.7 kg    Telemetry    SR with PACs - Personally Reviewed  ECG    No new tracings - Personally Reviewed  Physical Exam   GEN: Critically ill appearing.  Neck: No JVD. Cardiac: RRR, no murmurs, rubs, or gallops.  Respiratory: Vented breath sounds bilaterally.  GI: Soft, nontender, non-distended.   MS: No edema. Neuro:  Intubated and sedated.  Psych: Intubated and sedated.  Labs    Chemistry Recent Labs  Lab 05/12/21 0431 05/12/21 1025 05/12/21 1603 05/13/21 0311  NA 132*  132* 132* 131*  K 4.1 4.4 4.4 4.0  CL 98 94* 94* 98  CO2 '25 27 26 26  ' GLUCOSE 143* 155* 162* 159*  BUN 53* 50* 44* 36*  CREATININE 2.38* 2.29* 2.04* 1.77*  CALCIUM 6.3* 7.6* 7.8* 8.1*  PROT 5.3* 6.3*  --  5.9*  ALBUMIN 2.1*   2.0* 2.5* 2.7* 2.4*   2.4*  AST 6,519* 6,250*  --  4,114*  ALT 1,613* 1,711*  --  1,400*  ALKPHOS 232* 262*  --  266*  BILITOT 3.0* 3.9*  --  4.0*  GFRNONAA 21* 22* 25* 30*  ANIONGAP '9 11 12 7     ' Hematology Recent Labs  Lab 05/11/21 0531 05/11/21 1239 05/11/21 2052 05/12/21 1025 05/13/21 0311  WBC 42.8*  --   --  40.8* 34.8*  RBC 3.76*  --   --  2.86* 2.53*  HGB 10.8*   < > 9.9* 8.2* 7.3*  HCT 33.2*   < > 30.1*  24.5* 22.2*  MCV 88.3  --   --  85.7 87.7  MCH 28.7  --   --  28.7 28.9  MCHC 32.5  --   --  33.5 32.9  RDW 18.6*  --   --  19.2* 19.9*  PLT 219  --   --  187 141*   < > = values in this interval not displayed.    Cardiac EnzymesNo results for input(s): TROPONINI in the last 168 hours. No results for input(s): TROPIPOC in the last 168 hours.   BNPNo results for input(s): BNP, PROBNP in the last 168 hours.   DDimer No results for input(s): DDIMER in the last 168 hours.   Radiology    DG Abd 1 View  Result Date: 05/11/2021 IMPRESSION: 1. Well-positioned nasal/orogastric tube. Electronically Signed   By: Lajean Manes M.D.   On: 05/11/2021 11:33   DG Chest Port 1 View  Result Date: 05/11/2021 IMPRESSION: 1. New left internal jugular central venous catheter, tip projecting in the mid superior vena cava. 2. No pneumothorax. 3. Mild increase in lung base opacities consistent with atelectasis. No other change. Electronically Signed   By: Lajean Manes M.D.   On: 05/11/2021 12:56    Cardiac Studies   2D echo 05/06/2021: 1. Left ventricular ejection fraction, by estimation, is 50 to 55%. The  left ventricle has low normal function. Left ventricular endocardial  border not optimally defined to evaluate regional wall motion. There is  mild left ventricular hypertrophy. Left  ventricular diastolic parameters are indeterminate.   2. Right ventricular systolic function is normal. The right ventricular  size is normal. Tricuspid regurgitation signal is inadequate for assessing  PA pressure.   3. Left atrial size was mildly dilated.   4. Right atrial size was mildly dilated.   5. The mitral valve is normal in structure. No evidence of mitral valve  regurgitation. No evidence of mitral stenosis.   6. The aortic valve is normal in structure. Aortic valve regurgitation is  not visualized. Aortic valve sclerosis/calcification is present, without  any evidence of aortic stenosis.   7.  challenging image quality.  Patient Profile     71 y.o. female with history of asthma, COPD, HTN, DMII, obesity, OSA on CPAP, GERD, and COVID-19 pneumonia who was admitted 1/22 with septic shock secondary to left lower extremity necrotizing fasciitis s/p debridement, and S pyogenes bacteremia, requiring phenylephrine and norepinephrine with admission complicated by AKI requiring CRRT and severe anemia, along with the development of  rapid Afib on 1/23 with conversion to sinus vs ectopic atrial rhythm early on 1/24 on amiodarone.  Because of anemia anticoagulation was deferred.  On 1/27, she had hemodynamic deterioration requiring intubation along with multiple pressors including epinephrine, norepinephrine, dopamine, vasopressin.  We are seeing for Afib with RVR.  Assessment & Plan    1. Afib with RVR: -In the setting of her severe acute illness -Maintaining sinus rhythm -Heparin gtt/OAC has been deferred due to brief episode in the setting of her acute illness, and in the context of severe anemia requiring pRBC -High risk for recurrent atrial arrhythmia -If she has recurrence of Afib, and is hemodynamically unstable, would require DCCV -Potassium and magnesium normal -Check TSH -Echo with low normal LVSF  Remaining of care per primary service and CCM       For questions or updates, please contact [facility] Please consult [email or web address] for contact info under Cardiology/STEMI.    Signed, Christell Faith, PA-C [place] Pager: [phone] 05/13/2021, 8:32 AM

## 2021-05-13 NOTE — Progress Notes (Signed)
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 EEG today showed triphasic waves but no epileptiform discharges.  O:  Vitals:   05/13/21 1845 05/13/21 1900  BP: (!) 118/46 (!) 108/52  Pulse: 85 84  Resp: 20 18  Temp: (!) 97.5 F (36.4 C) (!) 97.5 F (36.4 C)  SpO2: 90% 91%    Physical Exam  HEENT-  [place]/AT. Scleral icterus noted.    Lungs- Intubated.    Extremities- Dressing and wound vac to LLE     Neurological Examination Mental Status: Opens eyes to sternal rub and localizes with RUE. Will also move LUE towards chest when RUE is held in position during sternal rub. Does not gaze towards or away from visual stimuli. No attempts to communicate. Not responding to commands.  Cranial Nerves: II: PERRL 2 mm >> 1 mm. No blink to threat bilaterally III,IV, VI: No ptosis. Eyes are dysconjugated. Doll's eye reflex suppressed in the awake state.  V,VII: Weak corneal reflexes bilaterally VIII: Not responding to voice IX,X: Intubated XI: Head is midline XII: Intubated Motor/Sensory: Flaccid tone x 4 while at rest.   Not responding to any motor commands.  Localizes with RUE during sternal rub, moving with 2/5 strength. Will also move LUE slightly towards chest, but more weakly than RUE, when RUE is held in position during sternal rub. RLE only movement is dorsiflexion of foot to noxious plantar stimulation.  No movement of LLE to any stimuli.  Deep Tendon Reflexes: Hypoactive throughout Plantars: Right: Weakly upgoing                                    Left: Mute Cerebellar/Gait: Unable to assess  Assessment: 71 y.o. female with a PMHx of asthma/COPD, DM2, HTN, OSA on CPAP, GERD, COVID-19 pneumonia, and bilateral lower extremity edema who was admitted to the ICU on 1/22 for streptococcal toxic shock syndrome 2/2 LLE cellulitis with Strep pyogenes bacteremia. She also had AKI in the setting of shock, antihypertensive use and NSAID use. On 1/28, CCM exam revealed  quivering/myoclonus of the jaw and intermittent eye opening without significant stimulus. At about 8 PM that day, the patient began to exhibit involuntary jerking following suction by RT. CCM NP was called to examine the patient and noted that she was demonstrating rhythmic jerking of bilateral arms and head with no purposeful movement noted, no response to stimuli and no blink to threat. Event resolved without PRN administration, lasting about 2 minutes. EEG was ordered, the patient was loaded with 1000 mg Keppra followed by orders for 1000 mg BID while on CRRT. Versed PRN seizure activity was also ordered. CCM note stated that VPA would be avoided due to liver shock. The myoclonic seizure activity is suspected to be due to anoxic brain injury. However, the patient so far has been too unstable for MRI.  - Neurological exam reveals patient to be in an awake semiresponsive state, localizing to sternal rub but not following any commands. No myoclonus or other seizure activity noted.  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  - Severe AKI on CRRT - Multiple electrolyte derangements - Overall prognosis is poor - Neurological prognostication will require MRI brain to assess for  possible anoxic brain injury.  - EEG 1/30 showed triphasic waves but no epileptiform abnl. Recommend continuation of keppra given recent clinical events c/f seizure   Recommendations: - Continue Keppra 1000 mg IV BID while on CRRT. Will need to switch to alternate renal dosing regimen if started on intermittent hemodialysis - Continue Versed PRN seizure activity - Agree that VPA is not a good choice given shock liver - Neurology will continue to follow.   Su Monks, MD [facility] [phone]  If 7pm- 7am, please page neurology on call as listed in [place].

## 2021-05-13 NOTE — TOC Progression Note (Signed)
Transition of Care [facility]) - Progression Note    Patient Details  Name: Alexandra Foster MRN: [id number] Date of Birth: 04-Oct-1950  [facility] CM/SW Contact  Shelbie Hutching, RN Phone Number: 05/13/2021, 11:49 AM  Clinical Narrative:    Patient is currently in the ICU intubated and sedated requiring CRRT.    TOC will follow.   Expected Discharge Plan: [place] Barriers to Discharge: Continued Medical Work up  Expected Discharge Plan and Services Expected Discharge Plan: [place]   Discharge Planning Services: CM Consult   Living arrangements for the past 2 months: Single Family Home                                       Social Determinants of Health (SDOH) Interventions    Readmission Risk Interventions No flowsheet data found.

## 2021-05-13 NOTE — Progress Notes (Signed)
Eeg done 

## 2021-05-13 NOTE — Progress Notes (Signed)
[facility]  ROUNDING NOTE   Subjective:  Patient remains critically ill at the moment. Still on CRRT at this time. Case discussed with nursing. We decided to increase ultrafiltration target to 100 cc/h.   Objective:  Vital signs in last 24 hours:  Temp:  [96.4 F (35.8 C)-98.4 F (36.9 C)] 97.5 F (36.4 C) (01/30 1000) Pulse Rate:  [77-92] 83 (01/30 1000) Resp:  [4-27] 20 (01/30 1000) BP: (113-160)/(40-58) 117/44 (01/30 1000) SpO2:  [88 %-100 %] 92 % (01/30 1000) FiO2 (%):  [40 %-70 %] 40 % (01/30 1000) Weight:  [130.7 kg] 130.7 kg (01/30 0445)  Weight change: 0.3 kg Filed Weights   05/11/21 0500 05/12/21 0500 05/13/21 0445  Weight: 128.3 kg 130.4 kg 130.7 kg    Intake/Output: I/O last 3 completed shifts: In: 7608.5 [I.V.:4453.4; Other:396; NG/GT:330; IV Piggyback:2429.1] Out: 8135 [Urine:22; Emesis/NG output:110; Drains:230; Other:7773]   Intake/Output this shift:  Total I/O In: 168.2 [I.V.:38.2; Other:30; IV Piggyback:100] Out: 328 [Emesis/NG output:30; Other:298]  Physical Exam: General: Critically ill-appearing  Head: Normocephalic, atraumatic.  Endotracheal tube in place  Eyes: Anicteric  Neck: Supple  Lungs:  Scattered rhonchi bilateral, normal effort  Heart: S1S2 no rubs  Abdomen:  Soft, nontender, bowel sounds present  Extremities: Left lower extremity wound VAC in place.  2+ right lower extremity edema  Neurologic: Intubated, sedated  Skin: No acute rash  Access: Left IJ temporary dialysis catheter    Basic Metabolic Panel: Recent Labs  Lab 05/10/21 2040 05/11/21 0040 05/11/21 0531 05/11/21 1600 05/11/21 2052 05/12/21 0431 05/12/21 1025 05/12/21 1603 05/13/21 0311  NA 138   < > 137   < > 136 132* 132* 132* 131*  K >7.5*   < > 5.4*   < > 4.6 4.1 4.4 4.4 4.0  CL 106   < > 98   < > 95* 98 94* 94* 98  CO2 <7*   < > 15*   < > '25 25 27 26 26  ' GLUCOSE 24*   < > 368*   < > 123* 143* 155* 162* 159*  BUN 86*   < > 92*   < > 72* 53*  50* 44* 36*  CREATININE 4.32*   < > 4.21*   < > 3.28* 2.38* 2.29* 2.04* 1.77*  CALCIUM 7.5*   < > 6.8*   < > 6.7* 6.3* 7.6* 7.8* 8.1*  MG 3.6*  --  2.9*  --  2.2  --  2.1  --  1.8  PHOS 17.5*  --  14.2*   < > 8.4* 6.1* 5.8* 4.9* 3.4   < > = values in this interval not displayed.    Liver Function Tests: Recent Labs  Lab 05/10/21 0450 05/11/21 0531 05/11/21 1600 05/12/21 0431 05/12/21 1025 05/12/21 1603 05/13/21 0311  AST 97* 4,359*  --  6,519* 6,250*  --  4,114*  ALT 36 1,746*  --  1,613* 1,711*  --  1,400*  ALKPHOS 145* 185*  --  232* 262*  --  266*  BILITOT 1.8* 2.5*  --  3.0* 3.9*  --  4.0*  PROT 5.0* 4.6*  --  5.3* 6.3*  --  5.9*  ALBUMIN <1.5* <1.5* <1.5* 2.1*   2.0* 2.5* 2.7* 2.4*   2.4*   No results for input(s): LIPASE, AMYLASE in the last 168 hours. No results for input(s): AMMONIA in the last 168 hours.  CBC: Recent Labs  Lab 05/10/21 2040 05/11/21 0040 05/11/21 0531 05/11/21 1239 05/11/21 2052 05/12/21  1025 05/13/21 0311  WBC 40.9* 42.7* 42.8*  --   --  40.8* 34.8*  NEUTROABS  --   --  34.4*  --   --   --   --   HGB 4.9* 9.7* 10.8* 9.9* 9.9* 8.2* 7.3*  HCT 16.3* 32.2* 33.2* 29.8* 30.1* 24.5* 22.2*  MCV 86.7 91.5 88.3  --   --  85.7 87.7  PLT 267 208 219  --   --  187 141*    Cardiac Enzymes: Recent Labs  Lab 05/08/21 1313 05/12/21 1603  CKTOTAL 1,575* 4,670*    BNP: Invalid input(s): POCBNP  CBG: Recent Labs  Lab 05/12/21 1613 05/12/21 1945 05/12/21 2314 05/13/21 0428 05/13/21 0734  GLUCAP 152* 156* 176* 155* 142*    Microbiology: Results for orders placed or performed during the hospital encounter of 05/04/21  Blood Culture (routine x 2)     Status: Abnormal   Collection Time: 05/04/21  7:43 PM   Specimen: BLOOD  Result Value Ref Range Status   Specimen Description   Final    BLOOD RIGHT ANTECUBITAL Performed at [facility], [place] [address], [place], [place] [address]    Special Requests   Final    BOTTLES DRAWN AEROBIC  AND ANAEROBIC Blood Culture adequate volume Performed at [facility], [address] [place], [place] [address]    Culture  Setup Time   Final    GRAM POSITIVE COCCI IN BOTH AEROBIC AND ANAEROBIC BOTTLES RESULT CALLED TO, READ BACK BY AND VERIFIED WITH: Nilsa Nutting 05/05/21 @ 1007 BY SB    Culture (A)  Final    GROUP A STREP (S.PYOGENES) ISOLATED HEALTH DEPARTMENT NOTIFIED Performed at [facility], [place] [address] [place], [place] [address]    Report Status 05/07/2021 FINAL  Final   Organism ID, Bacteria GROUP A STREP (S.PYOGENES) ISOLATED  Final      Susceptibility   Group a strep (s.pyogenes) isolated - MIC*    PENICILLIN <=0.06 SENSITIVE Sensitive     CEFTRIAXONE <=0.12 SENSITIVE Sensitive     ERYTHROMYCIN <=0.12 SENSITIVE Sensitive     LEVOFLOXACIN 1 SENSITIVE Sensitive     VANCOMYCIN 0.5 SENSITIVE Sensitive     * GROUP A STREP (S.PYOGENES) ISOLATED  Blood Culture ID Panel (Reflexed)     Status: Abnormal   Collection Time: 05/04/21  7:43 PM  Result Value Ref Range Status   Enterococcus faecalis NOT DETECTED NOT DETECTED Final   Enterococcus Faecium NOT DETECTED NOT DETECTED Final   Listeria monocytogenes NOT DETECTED NOT DETECTED Final   Staphylococcus species NOT DETECTED NOT DETECTED Final   Staphylococcus aureus (BCID) NOT DETECTED NOT DETECTED Final   Staphylococcus epidermidis NOT DETECTED NOT DETECTED Final   Staphylococcus lugdunensis NOT DETECTED NOT DETECTED Final   Streptococcus species DETECTED (A) NOT DETECTED Final    Comment: RESULT CALLED TO, READ BACK BY AND VERIFIED WITH: Nilsa Nutting 05/05/21 @ 1007 BY SB    Streptococcus agalactiae NOT DETECTED NOT DETECTED Final   Streptococcus pneumoniae NOT DETECTED NOT DETECTED Final   Streptococcus pyogenes DETECTED (A) NOT DETECTED Final    Comment: RESULT CALLED TO, READ BACK BY AND VERIFIED WITH: Nilsa Nutting 05/05/21 @ 1007 BY SB    A.calcoaceticus-baumannii NOT DETECTED NOT DETECTED  Final   Bacteroides fragilis NOT DETECTED NOT DETECTED Final   Enterobacterales NOT DETECTED NOT DETECTED Final   Enterobacter cloacae complex NOT DETECTED NOT DETECTED Final   Escherichia coli NOT DETECTED NOT DETECTED Final  Klebsiella aerogenes NOT DETECTED NOT DETECTED Final   Klebsiella oxytoca NOT DETECTED NOT DETECTED Final   Klebsiella pneumoniae NOT DETECTED NOT DETECTED Final   Proteus species NOT DETECTED NOT DETECTED Final   Salmonella species NOT DETECTED NOT DETECTED Final   Serratia marcescens NOT DETECTED NOT DETECTED Final   Haemophilus influenzae NOT DETECTED NOT DETECTED Final   Neisseria meningitidis NOT DETECTED NOT DETECTED Final   Pseudomonas aeruginosa NOT DETECTED NOT DETECTED Final   Stenotrophomonas maltophilia NOT DETECTED NOT DETECTED Final   Candida albicans NOT DETECTED NOT DETECTED Final   Candida auris NOT DETECTED NOT DETECTED Final   Candida glabrata NOT DETECTED NOT DETECTED Final   Candida krusei NOT DETECTED NOT DETECTED Final   Candida parapsilosis NOT DETECTED NOT DETECTED Final   Candida tropicalis NOT DETECTED NOT DETECTED Final   Cryptococcus neoformans/gattii NOT DETECTED NOT DETECTED Final    Comment: Performed at [facility], [address] [place], [place] [address]  Blood Culture (routine x 2)     Status: Abnormal   Collection Time: 05/04/21  9:29 PM   Specimen: BLOOD  Result Value Ref Range Status   Specimen Description   Final    BLOOD RIGHT ANTECUBITAL Performed at [facility], [address] [place], [place] [address]    Special Requests   Final    BOTTLES DRAWN AEROBIC AND ANAEROBIC Blood Culture adequate volume Performed at [facility], [address] [place], [place] [address]    Culture  Setup Time   Final    GRAM POSITIVE COCCI IN BOTH AEROBIC AND ANAEROBIC BOTTLES CRITICAL VALUE NOTED.  VALUE IS CONSISTENT WITH PREVIOUSLY REPORTED AND CALLED VALUE. Performed at [facility],  [address] [place], [place] [address]    Culture (A)  Final    GROUP A STREP (S.PYOGENES) ISOLATED HEALTH DEPARTMENT NOTIFIED SUSCEPTIBILITIES PERFORMED ON PREVIOUS CULTURE WITHIN THE LAST 5 DAYS. STAPHYLOCOCCUS EPIDERMIDIS THE SIGNIFICANCE OF ISOLATING THIS ORGANISM FROM A SINGLE SET OF BLOOD CULTURES WHEN MULTIPLE SETS ARE DRAWN IS UNCERTAIN. PLEASE NOTIFY THE MICROBIOLOGY DEPARTMENT WITHIN ONE WEEK IF SPECIATION AND SENSITIVITIES ARE REQUIRED. Performed at [facility], [place] [address], [place], [place] [address]    Report Status 05/08/2021 FINAL  Final  Resp Panel by RT-PCR (Flu A&B, Covid) Nasopharyngeal Swab     Status: None   Collection Time: 05/04/21  9:50 PM   Specimen: Nasopharyngeal Swab; Nasopharyngeal(NP) swabs in vial transport medium  Result Value Ref Range Status   SARS Coronavirus 2 by RT PCR NEGATIVE NEGATIVE Final    Comment: (NOTE) SARS-CoV-2 target nucleic acids are NOT DETECTED.  The SARS-CoV-2 RNA is generally detectable in upper respiratory specimens during the acute phase of infection. The lowest concentration of SARS-CoV-2 viral copies this assay can detect is 138 copies/mL. A negative result does not preclude SARS-Cov-2 infection and should not be used as the sole basis for treatment or other patient management decisions. A negative result may occur with  improper specimen collection/handling, submission of specimen other than nasopharyngeal swab, presence of viral mutation(s) within the areas targeted by this assay, and inadequate number of viral copies(<138 copies/mL). A negative result must be combined with clinical observations, patient history, and epidemiological information. The expected result is Negative.  Fact Sheet for Patients:  EntrepreneurPulse.com.au  Fact Sheet for Healthcare Providers:  IncredibleEmployment.be  This test is no t yet approved or cleared by the [place] FDA and  has  been authorized for detection and/or  diagnosis of SARS-CoV-2 by FDA under an Emergency Use Authorization (EUA). This EUA will remain  in effect (meaning this test can be used) for the duration of the COVID-19 declaration under Section 564(b)(1) of the Act, 21 U.S.C.section 360bbb-3(b)(1), unless the authorization is terminated  or revoked sooner.       Influenza A by PCR NEGATIVE NEGATIVE Final   Influenza B by PCR NEGATIVE NEGATIVE Final    Comment: (NOTE) The Xpert Xpress SARS-CoV-2/FLU/RSV plus assay is intended as an aid in the diagnosis of influenza from Nasopharyngeal swab specimens and should not be used as a sole basis for treatment. Nasal washings and aspirates are unacceptable for Xpert Xpress SARS-CoV-2/FLU/RSV testing.  Fact Sheet for Patients: EntrepreneurPulse.com.au  Fact Sheet for Healthcare Providers: IncredibleEmployment.be  This test is not yet approved or cleared by the [place] FDA and has been authorized for detection and/or diagnosis of SARS-CoV-2 by FDA under an Emergency Use Authorization (EUA). This EUA will remain in effect (meaning this test can be used) for the duration of the COVID-19 declaration under Section 564(b)(1) of the Act, 21 U.S.C. section 360bbb-3(b)(1), unless the authorization is terminated or revoked.  Performed at [facility], [address] [place], [place] [address]   MRSA Next Gen by PCR, Nasal     Status: None   Collection Time: 05/05/21 11:02 AM   Specimen: Nasal Mucosa; Nasal Swab  Result Value Ref Range Status   MRSA by PCR Next Gen NOT DETECTED NOT DETECTED Final    Comment: (NOTE) The GeneXpert MRSA Assay (FDA approved for NASAL specimens only), is one component of a comprehensive MRSA colonization surveillance program. It is not intended to diagnose MRSA infection nor to guide or monitor treatment for MRSA infections. Test performance is not FDA approved in patients  less than 64 years old. Performed at [facility], [address] [place], [place] [address]   Urine Culture     Status: None   Collection Time: 05/07/21  2:30 PM   Specimen: Urine, Random  Result Value Ref Range Status   Specimen Description   Final    URINE, RANDOM Performed at [facility], [address] [place], [place] [address]    Special Requests   Final    NONE Performed at [facility], [address], [place], [place] [address]    Culture   Final    NO GROWTH Performed at [facility], [place] [address], [place], [place] [address]    Report Status 05/09/2021 FINAL  Final  Aerobic/Anaerobic Culture w Gram Stain (surgical/deep wound)     Status: None (Preliminary result)   Collection Time: 05/07/21  9:35 PM   Specimen: PATH Other; Tissue  Result Value Ref Range Status   Specimen Description   Final    LEG LEFT Performed at [facility] [address] [place], [place] [address]    Special Requests PT PREVIOUSLY ON CLINDAMYCIN,PENICILLIN  Final   Gram Stain   Final    RARE WBC PRESENT,BOTH PMN AND MONONUCLEAR NO ORGANISMS SEEN    Culture   Final    NO GROWTH 4 DAYS NO ANAEROBES ISOLATED; CULTURE IN PROGRESS FOR 5 DAYS Performed at [facility], [place] [address], [place], [place] [address]    Report Status PENDING  Incomplete  CULTURE, BLOOD (ROUTINE X 2) w Reflex to ID Panel     Status: None (Preliminary result)   Collection Time: 05/09/21  7:13 AM  Specimen: BLOOD  Result Value Ref Range Status   Specimen Description BLOOD BLOOD LEFT HAND  Final   Special Requests   Final    BOTTLES DRAWN AEROBIC AND ANAEROBIC Blood Culture adequate volume   Culture   Final    NO GROWTH 4 DAYS Performed at [facility], [address] [place], [place] [address]    Report Status PENDING  Incomplete  CULTURE, BLOOD (ROUTINE X 2) w Reflex to ID Panel     Status: None (Preliminary result)   Collection Time: 05/09/21   7:23 AM   Specimen: BLOOD  Result Value Ref Range Status   Specimen Description BLOOD LEFT THUMB  Final   Special Requests   Final    BOTTLES DRAWN AEROBIC AND ANAEROBIC Blood Culture results may not be optimal due to an inadequate volume of blood received in culture bottles   Culture   Final    NO GROWTH 4 DAYS Performed at [facility], [address] [place], [place] [address]    Report Status PENDING  Incomplete    Coagulation Studies: No results for input(s): LABPROT, INR in the last 72 hours.  Urinalysis: No results for input(s): COLORURINE, LABSPEC, PHURINE, GLUCOSEU, HGBUR, BILIRUBINUR, KETONESUR, PROTEINUR, UROBILINOGEN, NITRITE, LEUKOCYTESUR in the last 72 hours.  Invalid input(s): APPERANCEUR    Imaging: DG Chest Port 1 View  Result Date: 05/11/2021 CLINICAL DATA:  Central line placement. EXAM: PORTABLE CHEST 1 VIEW COMPARISON:  05/10/2021 and earlier exams. FINDINGS: New left internal jugular central venous line. Catheter tip projects in the mid superior vena cava at its confluence with the left brachiocephalic vein. Right internal jugular central venous line, endotracheal tube and nasal/orogastric tube are stable in well positioned. Mild increase in lung base opacities compared to the previous day's exam, consistent with atelectasis accentuated by low lung volumes and patient rotation. Remainder of the lungs is clear. No convincing pneumothorax. IMPRESSION: 1. New left internal jugular central venous catheter, tip projecting in the mid superior vena cava. 2. No pneumothorax. 3. Mild increase in lung base opacities consistent with atelectasis. No other change. Electronically Signed   By: Lajean Manes M.D.   On: 05/11/2021 12:56     Medications:    sodium chloride Stopped (05/13/21 0946)   albumin human     epinephrine 3 mcg/min (05/13/21 1000)   feeding supplement (PIVOT 1.5 CAL) Stopped (05/10/21 2054)   HYDROmorphone 0.5 mg/hr (05/13/21 1021)   Immune  Globulin 10% Stopped (05/12/21 2140)   levETIRAcetam 1,000 mg (05/13/21 0917)   linezolid (ZYVOX) IV 600 mg (05/13/21 1025)   norepinephrine (LEVOPHED) Adult infusion Stopped (05/12/21 1816)   pencillin G potassium IV 3 Million Units (05/13/21 0945)   prismasol BGK 2/2.5 dialysis solution 1,500 mL/hr at 05/13/21 0803   prismasol BGK 2/2.5 replacement solution 500 mL/hr at 05/13/21 0804   prismasol BGK 2/2.5 replacement solution 500 mL/hr at 05/13/21 0803   vasopressin 0.02 Units/min (05/13/21 1000)    vitamin C  500 mg Oral BID   budesonide (PULMICORT) nebulizer solution  0.25 mg Nebulization BID   chlorhexidine gluconate (MEDLINE KIT)  15 mL Mouth Rinse BID   Chlorhexidine Gluconate Cloth  6 each Topical Q0600   docusate  100 mg Per Tube BID   free water  30 mL Per Tube Q4H   heparin injection (subcutaneous)  5,000 Units Subcutaneous Q8H   hydrocortisone sod succinate (SOLU-CORTEF) inj  100 mg Intravenous Q8H   insulin aspart  1-3 Units Subcutaneous Q4H  insulin detemir  38 Units Subcutaneous BID   ipratropium-albuterol  3 mL Nebulization Q6H   mouth rinse  15 mL Mouth Rinse 10 times per day   multivitamin with minerals  1 tablet Per Tube Daily   nutrition supplement (JUVEN)  1 packet Per Tube BID BM   pantoprazole (PROTONIX) IV  40 mg Intravenous Q24H   polyethylene glycol  17 g Per Tube Daily   sodium chloride flush  10-40 mL Intracatheter Q12H   zinc sulfate  220 mg Per Tube Daily   docusate sodium, heparin, HYDROmorphone, lip balm, midazolam, ondansetron (ZOFRAN) IV, oxyCODONE, sennosides, sodium chloride, sodium chloride flush  Assessment/ Plan:  71 y.o. female with past medical history of diabetes mellitus type 2, hypertension, obstructive sleep apnea on CPAP, GERD, history of COVID-19 pneumonia who came in with left lower extremity pain and chills and subsequently found to have streptococcal pyogenes bacteremia and cellulitis status post surgical debridement with subsequent  wound VAC placement.  1.  Acute kidney injury secondary to ATN from septic shock. 01/29 0701 - 01/30 0700 In: 3461.1 [I.V.:1437.1; NG/GT:330; IV Piggyback:1298] Out: 4830 [Urine:22; Emesis/NG output:110; Drains:230] Lab Results  Component Value Date   CREATININE 1.77 (H) 05/13/2021   CREATININE 2.04 (H) 05/12/2021   CREATININE 2.29 (H) 05/12/2021  Patient continues to be oliguric at this point in time.  She is requiring multiple pressors.  Therefore we will continue CRRT.  Increase ultrafiltration target to 100 cc/h.  2.  Acute respiratory failure.  Continue ventilatory support at this time.  3.  Hypotension.  Maintain pressors to achieve a map of 65 or greater.  4.  Left lower extremity deep infection status post surgical debridement and wound VAC placement/Streptococcus pyogenes infection.  Patient on linezolid and penicillin at this time.  5.  Hyponatremia.  Serum sodium 131.  Should continue to improve with ongoing dialysis treatment    LOS: 9 Jazminn Pomales 1/30/202310:30 AM

## 2021-05-13 NOTE — Progress Notes (Signed)
Patient ID: Alexandra Foster, female   DOB: 23-Jul-1950, 70 y.o.   MRN: [id number]     [facility] Day(s): 9.   Interval History: Patient seen and examined, no acute events or new complaints overnight.  Patient continue critically ill, sedated on mechanical ventilation.  Continued septic shock on vasopressors.  Vital signs in last 24 hours: [min-max] current  Temp:  [96.8 F (36 C)-98.2 F (36.8 C)] 97.7 F (36.5 C) (01/30 1834) Pulse Rate:  [75-93] 92 (01/30 1834) Resp:  [11-29] 15 (01/30 1834) BP: (108-158)/(40-80) 108/48 (01/30 1830) SpO2:  [88 %-100 %] 91 % (01/30 1834) FiO2 (%):  [40 %-60 %] 45 % (01/30 1630) Weight:  [130.7 kg] 130.7 kg (01/30 0445)     Height: 5\' 5"  (165.1 cm) Weight: 130.7 kg BMI (Calculated): 47.95   Physical Exam:  Constitutional: Critically ill, sedated on mechanical ventilation Extremity: Left leg muscular tissue without sign of ischemia.  No purulence.  There was a small 3 x 3 cm patch of ischemia on the anterior part of the foot.         Labs:  CBC Latest Ref Rng & Units 05/13/2021 05/13/2021 05/12/2021  WBC 4.0 - 10.5 K/uL - 34.8(H) 40.8(H)  Hemoglobin 12.0 - 15.0 g/dL 7.6(L) 7.3(L) 8.2(L)  Hematocrit 36.0 - 46.0 % 22.4(L) 22.2(L) 24.5(L)  Platelets 150 - 400 K/uL - 141(L) 187   CMP Latest Ref Rng & Units 05/13/2021 05/12/2021 05/12/2021  Glucose 70 - 99 mg/dL 159(H) 162(H) 155(H)  BUN 8 - 23 mg/dL 36(H) 44(H) 50(H)  Creatinine 0.44 - 1.00 mg/dL 1.77(H) 2.04(H) 2.29(H)  Sodium 135 - 145 mmol/L 131(L) 132(L) 132(L)  Potassium 3.5 - 5.1 mmol/L 4.0 4.4 4.4  Chloride 98 - 111 mmol/L 98 94(L) 94(L)  CO2 22 - 32 mmol/L 26 26 27   Calcium 8.9 - 10.3 mg/dL 8.1(L) 7.8(L) 7.6(L)  Total Protein 6.5 - 8.1 g/dL 5.9(L) - 6.3(L)  Total Bilirubin 0.3 - 1.2 mg/dL 4.0(H) - 3.9(H)  Alkaline Phos 38 - 126 U/L 266(H) - 262(H)  AST 15 - 41 U/L 4,114(H) - 6,250(H)  ALT 0 - 44 U/L 1,400(H) - 1,711(H)    Imaging studies: No new pertinent  imaging studies   Assessment/Plan:  71 y.o. female with left leg necrotizing fasciitis with 4 Day Post-Op s/p debridement, complicated by pertinent comorbidities including strep pyogenes bacteremia now in septic shock, respiratory failure mechanical ventilation, liver failure, acute renal failure on CRRT, COPD, type 2 diabetes mellitus, hypertension, obstructive sleep apnea, GERD   Left leg necrotizing fasciitis -S/p debridement 05/07/2021 -Second debridement on 05/10/2021.  Application of wound VAC system -I personally changed the wound VAC (DME) today.  Wound still measures 40 cm x 30 cm. -There was a small patch of ischemia on the anterior portion of the left foot.  Eventually this might need to come off but no need of debridement at this moment.  We will let tissue demarcate.  No sign of purulence. -I will continue to follow closely.  Arnold Long, MD

## 2021-05-14 ENCOUNTER — Inpatient Hospital Stay: Payer: Medicare HMO

## 2021-05-14 DIAGNOSIS — A400 Sepsis due to streptococcus, group A: Secondary | ICD-10-CM | POA: Diagnosis not present

## 2021-05-14 DIAGNOSIS — M726 Necrotizing fasciitis: Secondary | ICD-10-CM

## 2021-05-14 DIAGNOSIS — J9600 Acute respiratory failure, unspecified whether with hypoxia or hypercapnia: Secondary | ICD-10-CM

## 2021-05-14 DIAGNOSIS — G253 Myoclonus: Secondary | ICD-10-CM | POA: Diagnosis not present

## 2021-05-14 DIAGNOSIS — G9340 Encephalopathy, unspecified: Secondary | ICD-10-CM | POA: Diagnosis not present

## 2021-05-14 DIAGNOSIS — R6521 Severe sepsis with septic shock: Secondary | ICD-10-CM | POA: Diagnosis not present

## 2021-05-14 DIAGNOSIS — A419 Sepsis, unspecified organism: Secondary | ICD-10-CM | POA: Diagnosis not present

## 2021-05-14 LAB — APTT: aPTT: 45 seconds — ABNORMAL HIGH (ref 24–36)

## 2021-05-14 LAB — TYPE AND SCREEN
ABO/RH(D): O POS
Antibody Screen: NEGATIVE
Unit division: 0
Unit division: 0

## 2021-05-14 LAB — CBC
HCT: 20.5 % — ABNORMAL LOW (ref 36.0–46.0)
Hemoglobin: 6.7 g/dL — ABNORMAL LOW (ref 12.0–15.0)
MCH: 28.3 pg (ref 26.0–34.0)
MCHC: 32.7 g/dL (ref 30.0–36.0)
MCV: 86.5 fL (ref 80.0–100.0)
Platelets: 121 10*3/uL — ABNORMAL LOW (ref 150–400)
RBC: 2.37 MIL/uL — ABNORMAL LOW (ref 3.87–5.11)
RDW: 20.4 % — ABNORMAL HIGH (ref 11.5–15.5)
WBC: 28.4 10*3/uL — ABNORMAL HIGH (ref 4.0–10.5)
nRBC: 14 % — ABNORMAL HIGH (ref 0.0–0.2)

## 2021-05-14 LAB — HEMOGLOBIN AND HEMATOCRIT, BLOOD
HCT: 23.5 % — ABNORMAL LOW (ref 36.0–46.0)
Hemoglobin: 8 g/dL — ABNORMAL LOW (ref 12.0–15.0)

## 2021-05-14 LAB — SURGICAL PATHOLOGY

## 2021-05-14 LAB — RENAL FUNCTION PANEL
Albumin: 3 g/dL — ABNORMAL LOW (ref 3.5–5.0)
Albumin: 2.9 g/dL — ABNORMAL LOW (ref 3.5–5.0)
Anion gap: 9 (ref 5–15)
Anion gap: 10 (ref 5–15)
BUN: 33 mg/dL — ABNORMAL HIGH (ref 8–23)
BUN: 39 mg/dL — ABNORMAL HIGH (ref 8–23)
CO2: 26 mmol/L (ref 22–32)
CO2: 23 mmol/L (ref 22–32)
Calcium: 9.1 mg/dL (ref 8.9–10.3)
Calcium: 9.2 mg/dL (ref 8.9–10.3)
Chloride: 98 mmol/L (ref 98–111)
Chloride: 96 mmol/L — ABNORMAL LOW (ref 98–111)
Creatinine, Ser: 1.45 mg/dL — ABNORMAL HIGH (ref 0.44–1.00)
Creatinine, Ser: 1.75 mg/dL — ABNORMAL HIGH (ref 0.44–1.00)
GFR, Estimated: 38 mL/min — ABNORMAL LOW (ref >60)
GFR, Estimated: 31 mL/min — ABNORMAL LOW (ref >60)
Glucose, Bld: 193 mg/dL — ABNORMAL HIGH (ref 70–99)
Glucose, Bld: 275 mg/dL — ABNORMAL HIGH (ref 70–99)
Phosphorus: 2.3 mg/dL — ABNORMAL LOW (ref 2.5–4.6)
Phosphorus: 2.6 mg/dL (ref 2.5–4.6)
Potassium: 3.6 mmol/L (ref 3.5–5.1)
Potassium: 3.9 mmol/L (ref 3.5–5.1)
Sodium: 133 mmol/L — ABNORMAL LOW (ref 135–145)
Sodium: 129 mmol/L — ABNORMAL LOW (ref 135–145)

## 2021-05-14 LAB — GLUCOSE, CAPILLARY
Glucose-Capillary: 117 mg/dL — ABNORMAL HIGH (ref 70–99)
Glucose-Capillary: 191 mg/dL — ABNORMAL HIGH (ref 70–99)
Glucose-Capillary: 177 mg/dL — ABNORMAL HIGH (ref 70–99)
Glucose-Capillary: 218 mg/dL — ABNORMAL HIGH (ref 70–99)
Glucose-Capillary: 246 mg/dL — ABNORMAL HIGH (ref 70–99)
Glucose-Capillary: 232 mg/dL — ABNORMAL HIGH (ref 70–99)
Glucose-Capillary: 209 mg/dL — ABNORMAL HIGH (ref 70–99)

## 2021-05-14 LAB — BLOOD GAS, ARTERIAL
Acid-Base Excess: 2.8 mmol/L — ABNORMAL HIGH (ref 0.0–2.0)
Bicarbonate: 27.9 mmol/L (ref 20.0–28.0)
FIO2: 0.5
MECHVT: 460 mL
Mechanical Rate: 20
O2 Saturation: 98.9 %
PEEP: 5 cmH2O
Patient temperature: 37
pCO2 arterial: 45 mmHg (ref 32.0–48.0)
pH, Arterial: 7.4 (ref 7.350–7.450)
pO2, Arterial: 129 mmHg — ABNORMAL HIGH (ref 83.0–108.0)

## 2021-05-14 LAB — PREPARE RBC (CROSSMATCH)

## 2021-05-14 LAB — BPAM RBC
Blood Product Expiration Date: 202302142359
Blood Product Expiration Date: 202302142359
ISSUE DATE / TIME: 202301242212
ISSUE DATE / TIME: 202301242212
Unit Type and Rh: 5100
Unit Type and Rh: 5100

## 2021-05-14 LAB — CULTURE, BLOOD (ROUTINE X 2)
Culture: NO GROWTH
Culture: NO GROWTH
Special Requests: ADEQUATE

## 2021-05-14 LAB — PROTIME-INR
INR: 1.8 — ABNORMAL HIGH (ref 0.8–1.2)
Prothrombin Time: 21 seconds — ABNORMAL HIGH (ref 11.4–15.2)

## 2021-05-14 LAB — PROCALCITONIN: Procalcitonin: 21.12 ng/mL

## 2021-05-14 LAB — MAGNESIUM: Magnesium: 1.9 mg/dL (ref 1.7–2.4)

## 2021-05-14 LAB — T4, FREE: Free T4: 0.48 ng/dL — ABNORMAL LOW (ref 0.61–1.12)

## 2021-05-14 MED ORDER — HEPARIN SODIUM (PORCINE) 5000 UNIT/ML IJ SOLN: 5000.0000 [IU] | Freq: Two times a day (BID) | INTRAMUSCULAR | Status: DC

## 2021-05-14 MED ORDER — MAGNESIUM SULFATE 2 GM/50ML IV SOLN
2.0000 g | Freq: Once | INTRAVENOUS | Status: AC
  Administered 2021-05-14: 2 g via INTRAVENOUS
  Filled 2021-05-14: qty 50

## 2021-05-14 MED ORDER — RENA-VITE PO TABS
1.0000 | ORAL_TABLET | Freq: Every day | ORAL | Status: AC
  Administered 2021-05-15 – 2021-05-21 (×7): 1
  Filled 2021-05-14 (×7): qty 1

## 2021-05-14 MED ORDER — JUVEN PO PACK
1.0000 | PACK | Freq: Two times a day (BID) | ORAL | Status: AC
  Administered 2021-05-14 – 2021-05-21 (×13): 1

## 2021-05-14 MED ORDER — POTASSIUM PHOSPHATES 15 MMOLE/5ML IV SOLN
20.0000 mmol | Freq: Once | INTRAVENOUS | Status: AC
  Administered 2021-05-14: 20 mmol via INTRAVENOUS
  Filled 2021-05-14: qty 6.67

## 2021-05-14 MED ORDER — ASCORBIC ACID 500 MG PO TABS
500.0000 mg | ORAL_TABLET | Freq: Two times a day (BID) | ORAL | Status: AC
  Administered 2021-05-14 – 2021-05-21 (×14): 500 mg
  Filled 2021-05-14 (×14): qty 1

## 2021-05-14 MED ORDER — HEPARIN SODIUM (PORCINE) 1000 UNIT/ML DIALYSIS
1000.0000 [IU] | INTRAMUSCULAR | Status: DC | PRN
  Administered 2021-05-15: 1600 [IU] via INTRAVENOUS_CENTRAL
  Administered 2021-05-17: 1300 [IU] via INTRAVENOUS_CENTRAL
  Administered 2021-05-17: 1000 [IU] via INTRAVENOUS_CENTRAL
  Administered 2021-05-17: 1300 [IU] via INTRAVENOUS_CENTRAL
  Filled 2021-05-14: qty 4
  Filled 2021-05-14 (×4): qty 6
  Filled 2021-05-14: qty 4
  Filled 2021-05-14: qty 3
  Filled 2021-05-14 (×3): qty 6

## 2021-05-14 MED ORDER — MIDODRINE HCL 5 MG PO TABS
10.0000 mg | ORAL_TABLET | Freq: Three times a day (TID) | ORAL | Status: DC
  Administered 2021-05-14 – 2021-06-01 (×50): 10 mg
  Filled 2021-05-14 (×48): qty 2

## 2021-05-14 MED ORDER — AMIODARONE HCL IN DEXTROSE 360-4.14 MG/200ML-% IV SOLN
60.0000 mg/h | INTRAVENOUS | Status: AC
  Administered 2021-05-14 (×2): 60 mg/h via INTRAVENOUS
  Filled 2021-05-14 (×2): qty 200

## 2021-05-14 MED ORDER — SODIUM CHLORIDE 0.9 % IV SOLN
2.0000 g | INTRAVENOUS | Status: DC
  Administered 2021-05-14 – 2021-05-20 (×7): 2 g via INTRAVENOUS
  Filled 2021-05-14 (×7): qty 2

## 2021-05-14 MED ORDER — SODIUM CHLORIDE 0.9% IV SOLUTION: Freq: Once | INTRAVENOUS | Status: AC

## 2021-05-14 MED ORDER — LINEZOLID 600 MG PO TABS
600.0000 mg | ORAL_TABLET | Freq: Two times a day (BID) | ORAL | Status: DC
  Administered 2021-05-14 – 2021-05-20 (×13): 600 mg
  Filled 2021-05-14 (×14): qty 1

## 2021-05-14 MED ORDER — FENTANYL CITRATE PF 50 MCG/ML IJ SOSY
25.0000 ug | PREFILLED_SYRINGE | INTRAMUSCULAR | Status: DC | PRN
  Administered 2021-05-15 – 2021-05-20 (×2): 50 ug via INTRAVENOUS
  Filled 2021-05-14 (×2): qty 1

## 2021-05-14 MED ORDER — PROPOFOL 1000 MG/100ML IV EMUL
5.0000 ug/kg/min | INTRAVENOUS | Status: DC
  Administered 2021-05-14: 5 ug/kg/min via INTRAVENOUS
  Administered 2021-05-15 (×2): 13 ug/kg/min via INTRAVENOUS
  Administered 2021-05-15: 18 ug/kg/min via INTRAVENOUS
  Administered 2021-05-16: 15 ug/kg/min via INTRAVENOUS
  Filled 2021-05-14 (×6): qty 100

## 2021-05-14 MED ORDER — PROSOURCE TF PO LIQD
90.0000 mL | Freq: Two times a day (BID) | ORAL | Status: DC
  Administered 2021-05-14 – 2021-05-16 (×5): 90 mL
  Filled 2021-05-14 (×6): qty 90

## 2021-05-14 MED ORDER — FENTANYL CITRATE PF 50 MCG/ML IJ SOSY
PREFILLED_SYRINGE | INTRAMUSCULAR | Status: AC
  Administered 2021-05-14: 50 ug via INTRAVENOUS
  Filled 2021-05-14: qty 1

## 2021-05-14 MED ORDER — AMIODARONE HCL IN DEXTROSE 360-4.14 MG/200ML-% IV SOLN
30.0000 mg/h | INTRAVENOUS | Status: DC
  Administered 2021-05-14 (×2): 30 mg/h via INTRAVENOUS
  Filled 2021-05-14: qty 200

## 2021-05-14 MED ORDER — VITAL AF 1.2 CAL PO LIQD
1000.0000 mL | ORAL | Status: DC
  Administered 2021-05-14: 1000 mL

## 2021-05-14 MED ORDER — PHENYLEPHRINE HCL-NACL 20-0.9 MG/250ML-% IV SOLN
0.0000 ug/min | INTRAVENOUS | Status: DC
  Administered 2021-05-15: 20 ug/min via INTRAVENOUS
  Administered 2021-05-15: 50 ug/min via INTRAVENOUS
  Administered 2021-05-15: 75 ug/min via INTRAVENOUS
  Filled 2021-05-14 (×3): qty 250

## 2021-05-14 MED ORDER — LEVETIRACETAM 100 MG/ML PO SOLN
1000.0000 mg | Freq: Two times a day (BID) | ORAL | Status: DC
  Administered 2021-05-14 – 2021-05-19 (×11): 1000 mg
  Filled 2021-05-14 (×12): qty 10

## 2021-05-14 MED ORDER — FENTANYL CITRATE PF 50 MCG/ML IJ SOSY
50.0000 ug | PREFILLED_SYRINGE | Freq: Once | INTRAMUSCULAR | Status: AC
  Administered 2021-05-14: 50 ug via INTRAVENOUS

## 2021-05-14 MED ORDER — AMIODARONE LOAD VIA INFUSION
150.0000 mg | Freq: Once | INTRAVENOUS | Status: AC
Start: 2021-05-14 — End: 2021-05-14
  Administered 2021-05-14: 150 mg via INTRAVENOUS
  Filled 2021-05-14: qty 83.34

## 2021-05-14 NOTE — Progress Notes (Signed)
GOALS OF CARE DISCUSSION Daughter at bedside The Clinical status was relayed to family in detail.  Updated and notified of patients medical condition.    Patient remains unresponsive and will not open eyes to command.   Patient is having a weak cough and struggling to remove secretions.   Patient with increased WOB and using accessory muscles to breathe Explained to family course of therapy and the modalities    Patient with Progressive multiorgan failure with a very high probablity of a very minimal chance of meaningful recovery despite all aggressive and optimal medical therapy.  PATIENT REMAINS FULL CODE  Family understands the situation. Findings concerning for CVA, obtain MRI Continue vent and pressors and CRRT  Family are satisfied with Plan of action and management. All questions answered  Additional CC time 35 mins   Bennie Chirico Patricia Pesa, M.D.  [facility]  Medical Director [place] Director [facility]

## 2021-05-14 NOTE — Progress Notes (Signed)
Notified by Radiologist regarding abnormal MRI Brain findings with recommendation to obtain Non contrast CT Head to further assess if subarachnoid hemorrhage is present.  Therefore, stat Non contrasted CT Head ordered, and pts daughter updated regarding MRI Brain findings and the need for CT Head.  Dr. Mortimer Fries notified and agreed with plan of care as outline above.  Will continue to monitor and assess pt.  Rosilyn Mings, AGNP  Pulmonary/Critical Care Pager [phone] (please enter 7 digits) PCCM Consult Pager [phone] (please enter 7 digits)

## 2021-05-14 NOTE — Plan of Care (Signed)
°  Problem: Education: Goal: Knowledge of General Education information will improve Description: Including pain rating scale, medication(s)/side effects and non-pharmacologic comfort measures Outcome: Progressing   Problem: Health Behavior/Discharge Planning: Goal: Ability to manage health-related needs will improve Outcome: Progressing   Problem: Clinical Measurements: Goal: Ability to maintain clinical measurements within normal limits will improve Outcome: Progressing Goal: Will remain free from infection Outcome: Progressing Goal: Diagnostic test results will improve Outcome: Progressing   Problem: Pain Managment: Goal: General experience of comfort will improve Outcome: Progressing   Problem: Safety: Goal: Ability to remain free from injury will improve Outcome: Progressing   Problem: Skin Integrity: Goal: Risk for impaired skin integrity will decrease Outcome: Progressing   Problem: Activity: Goal: Ability to tolerate increased activity will improve Outcome: Progressing   Problem: Respiratory: Goal: Ability to maintain a clear airway and adequate ventilation will improve Outcome: Progressing   Problem: Role Relationship: Goal: Method of communication will improve Outcome: Progressing

## 2021-05-14 NOTE — Progress Notes (Addendum)
Wasted 30 mL of Dilaudid gtt with Ashok Pall, RN.

## 2021-05-14 NOTE — Progress Notes (Signed)
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 Opens her eyes to verbal stimuli off sedation but does not follow commands.   O:  Vitals:   05/14/21 1445 05/14/21 1500  BP: (!) 135/57 (!) 126/53  Pulse: 88 87  Resp: 20 18  Temp:    SpO2: 98% 98%    Physical Exam  HEENT-  [place]/AT. Scleral icterus noted.    Lungs- Intubated.    Extremities- Dressing and wound vac to LLE     Neurological Examination Mental Status: Opens eyes to verbal stimuli. Does not gaze towards or away from visual stimuli. No attempts to communicate. Not responding to commands.  Cranial Nerves: II: PERRL 2 mm >> 1 mm. No blink to threat bilaterally III,IV, VI: No ptosis. Eyes are dysconjugated. Doll's eye reflex suppressed in the awake state.  V,VII: Weak corneal reflexes bilaterally VIII: Not responding to voice IX,X: Intubated XI: Head is midline XII: Intubated Motor/Sensory: Flaccid tone x 4 while at rest.   Not responding to any motor commands.  Minimal withdrawal BUE to noxious stimuli  RLE only movement is dorsiflexion of foot to noxious plantar stimulation.  No movement of LLE to any stimuli.  Deep Tendon Reflexes: Hypoactive throughout Plantars: Right: Weakly upgoing                                    Left: Mute Cerebellar/Gait: Unable to assess  Assessment: 71 y.o. female with a PMHx of asthma/COPD, DM2, HTN, OSA on CPAP, GERD, COVID-19 pneumonia, and bilateral lower extremity edema who was admitted to the ICU on 1/22 for streptococcal toxic shock syndrome 2/2 LLE cellulitis with Strep pyogenes bacteremia. She also had AKI in the setting of shock, antihypertensive use and NSAID use. On 1/28, CCM exam revealed quivering/myoclonus of the jaw and intermittent eye opening without significant stimulus. At about 8 PM that day, the patient began to exhibit involuntary jerking following suction by RT. CCM NP was called to examine the patient and noted that she was demonstrating rhythmic  jerking of bilateral arms and head with no purposeful movement noted, no response to stimuli and no blink to threat. Event resolved without PRN administration, lasting about 2 minutes. EEG was ordered, the patient was loaded with 1000 mg Keppra followed by orders for 1000 mg BID while on CRRT. Versed PRN seizure activity was also ordered. CCM note stated that VPA would be avoided due to liver shock. The myoclonic seizure activity is suspected to be due to anoxic brain injury.  - Neurological exam reveals patient to be in an awake semiresponsive state, withdrawing to pain but not following any commands. No myoclonus or other seizure activity noted.  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  - Severe AKI on CRRT - Multiple electrolyte derangements - Overall prognosis is poor - Neurological prognostication will require MRI brain to assess for possible anoxic brain injury.  - EEG 1/30 showed triphasic waves but no epileptiform abnl. Recommend continuation of keppra given recent clinical events c/f seizure   Recommendations: - Continue Keppra 1000 mg IV BID while on CRRT. Will need to switch to alternate renal dosing regimen if started on intermittent hemodialysis - Continue Versed PRN seizure activity -  Agree that VPA is not a good choice given shock liver - MRI brain wo contrast today - Neurology will continue to follow.   Su Monks, MD [facility] [phone]  If 7pm- 7am, please page neurology on call as listed in [place].

## 2021-05-14 NOTE — Consult Note (Signed)
Neurosurgery-New Consultation Evaluation 05/14/2021 Alexandra Foster [id number]  Identifying Statement: Alexandra Foster is a 71 y.o. female from [place] [id number] with severe sepsis  Physician Requesting Consultation: No ref. provider found  History of Present Illness: Alexandra Foster is a 71 y.o female with a history of asthma, COPD, type 2 diabetes, hypertension, obstructive sleep apnea, GERD presenting to the ER with left lower extremity pain and fevers for about a week.  She is found to be in septic shock and admitted to the ICU.  She is currently undergoing treatment for left lower extremity cellulitis with necrotizing fasciitis status post debridement on 05/07/21.  She is currently in multisystem organ failure on CRRT.  Neurosurgery was consulted due to progressive cognitive decline and head CT showing small volume subarachnoid hemorrhage in the setting of a poor neurologic exam. Unfortunately patient is unable to write any additional history due to intubation and being non responsive.  Past Medical History:  Past Medical History:  Diagnosis Date   AKI (acute kidney injury) ([facility])    a. 04/2021 in setting of bacteremia/shock.   Arthritis    Asthma    Bacteremia    a. 04/2021 S pyogenes bacteremia in setting of lower ext cellulitis.   COPD (chronic obstructive pulmonary disease) (H[facility])    Diabetes mellitus without complication ([facility])    Endometriosis    GERD (gastroesophageal reflux disease)    History of echocardiogram    a. 07/2013 Echo: EF 55-60%, impaired relaxation, mild TR; b. 04/2021 Echo: EF 50-55%, mild LVH, nl RV fxn, mild BAE, Ao sclerosis w/o stenosis.   Hypertension    Obesity    PAF (paroxysmal atrial fibrillation) ([facility])    a. 04/2021 in setting of septic shock/cellulitis.   Sleep apnea    CPAP    Social History: Social History   Socioeconomic History   Marital status: Married    Spouse name: Trilby Drummer   Number of children: 1   Years of education: 12    Highest education level: 12th grade  Occupational History    Employer: RETIRED  Tobacco Use   Smoking status: Former    Packs/day: 2.00    Years: 40.00    Pack years: 80.00    Types: Cigarettes    Quit date: 2003    Years since quitting: 20.0   Smokeless tobacco: Former    Types: Snuff    Quit date: 04/2001   Tobacco comments:    smoking cessation materials not required  Vaping Use   Vaping Use: Never used  Substance and Sexual Activity   Alcohol use: No    Alcohol/week: 0.0 standard drinks   Drug use: No   Sexual activity: Not Currently  Other Topics Concern   Not on file  Social History Narrative   Lives locally w/ husband.  Does not routinely exercise.   Social Determinants of Health   Financial Resource Strain: Medium Risk   Difficulty of Paying Living Expenses: Somewhat hard  Food Insecurity: No Food Insecurity   Worried About Charity fundraiser in the Last Year: Never true   Ran Out of Food in the Last Year: Never true  Transportation Needs: No Transportation Needs   Lack of Transportation (Medical): No   Lack of Transportation (Non-Medical): No  Physical Activity: Inactive   Days of Exercise per Week: 0 days   Minutes of Exercise per Session: 0 min  Stress: No Stress Concern Present   Feeling of Stress : Not at all  Social Connections:  Moderately Integrated   Frequency of Communication with Friends and Family: More than three times a week   Frequency of Social Gatherings with Friends and Family: More than three times a week   Attends Religious Services: More than 4 times per year   Active Member of [employer] or Organizations: No   Attends Music therapist: Never   Marital Status: Married  Human resources officer Violence: Not At Risk   Fear of Current or Ex-Partner: No   Emotionally Abused: No   Physically Abused: No   Sexually Abused: No    Family History: Family History  Problem Relation Age of Onset   Congestive Heart Failure Mother     Coronary artery disease Father 52   Breast cancer Sister 74   Heart disease Brother    Varicose Veins Brother    Alcohol abuse Brother     Review of Systems:  Review of Systems - General ROS: Negative Psychological ROS: Negative Ophthalmic ROS: Negative ENT ROS: Negative Hematological and Lymphatic ROS: Negative  Endocrine ROS: Negative Respiratory ROS: Negative Cardiovascular ROS: Negative Gastrointestinal ROS: Negative Genito-Urinary ROS: Negative Musculoskeletal ROS: Negative Neurological ROS: Negative Dermatological ROS: Negative  Physical Exam: BP (!) 149/67    Pulse 89    Temp 99.1 F (37.3 C)    Resp (!) 21    Ht 5\' 5"  (1.651 m)    Wt 128.6 kg    SpO2 98%    BMI 47.18 kg/m  Body mass index is 47.18 kg/m. Body surface area is 2.43 meters squared. General appearance: intubated and unresponsive. Head: Normocephalic, atraumatic Eyes: pulps 61mm but equal and reactive to light Oropharynx: Moist without lesions Heart: Normal, regular rate and rhythm, without murmur Lungs: on vent  Abdomen: Soft, nondistended Ext: significant LLE wound with wound vac in place   Neurologic exam:  intubated and up responsive.  Unable to follow simply commands.  Cough and gag intact Localizes to pain with RUE otherwise grimaces to pain.  Laboratory: Results for orders placed or performed during the hospital encounter of 05/04/21  Resp Panel by RT-PCR (Flu A&B, Covid) Nasopharyngeal Swab   Specimen: Nasopharyngeal Swab; Nasopharyngeal(NP) swabs in vial transport medium  Result Value Ref Range   SARS Coronavirus 2 by RT PCR NEGATIVE NEGATIVE   Influenza A by PCR NEGATIVE NEGATIVE   Influenza B by PCR NEGATIVE NEGATIVE  Blood Culture (routine x 2)   Specimen: BLOOD  Result Value Ref Range   Specimen Description      BLOOD RIGHT ANTECUBITAL Performed at [facility] [address], [place], [place] [address]    Special Requests      BOTTLES DRAWN AEROBIC AND ANAEROBIC Blood  Culture adequate volume Performed at [facility], [place], [place] [address]    Culture  Setup Time      GRAM POSITIVE COCCI IN BOTH AEROBIC AND ANAEROBIC BOTTLES RESULT CALLED TO, READ BACK BY AND VERIFIED WITH: Nilsa Nutting 05/05/21 @ 1007 BY SB    Culture (A)     GROUP A STREP (S.PYOGENES) ISOLATED HEALTH DEPARTMENT NOTIFIED Performed at [facility], [place] [address], [place], [place] [address]    Report Status 05/07/2021 FINAL    Organism ID, Bacteria GROUP A STREP (S.PYOGENES) ISOLATED       Susceptibility   Group a strep (s.pyogenes) isolated - MIC*    PENICILLIN <=0.06 SENSITIVE Sensitive     CEFTRIAXONE <=0.12 SENSITIVE Sensitive     ERYTHROMYCIN <=0.12 SENSITIVE  Sensitive     LEVOFLOXACIN 1 SENSITIVE Sensitive     VANCOMYCIN 0.5 SENSITIVE Sensitive     * GROUP A STREP (S.PYOGENES) ISOLATED  Blood Culture (routine x 2)   Specimen: BLOOD  Result Value Ref Range   Specimen Description      BLOOD RIGHT ANTECUBITAL Performed at [facility], [address] [place], [place] [address]    Special Requests      BOTTLES DRAWN AEROBIC AND ANAEROBIC Blood Culture adequate volume Performed at [facility], [address] [place], [place] [address]    Culture  Setup Time      GRAM POSITIVE COCCI IN BOTH AEROBIC AND ANAEROBIC BOTTLES CRITICAL VALUE NOTED.  VALUE IS CONSISTENT WITH PREVIOUSLY REPORTED AND CALLED VALUE. Performed at [facility], [place], [place] [address]    Culture (A)     GROUP A STREP (S.PYOGENES) ISOLATED HEALTH DEPARTMENT NOTIFIED SUSCEPTIBILITIES PERFORMED ON PREVIOUS CULTURE WITHIN THE LAST 5 DAYS. STAPHYLOCOCCUS EPIDERMIDIS THE SIGNIFICANCE OF ISOLATING THIS ORGANISM FROM A SINGLE SET OF BLOOD CULTURES WHEN MULTIPLE SETS ARE DRAWN IS UNCERTAIN. PLEASE NOTIFY THE MICROBIOLOGY DEPARTMENT WITHIN ONE WEEK IF SPECIATION AND SENSITIVITIES ARE REQUIRED. Performed at [facility], [place] [address], [place], [place] [address]    Report Status 05/08/2021 FINAL   Blood Culture ID Panel (Reflexed)  Result Value Ref Range   Enterococcus faecalis NOT DETECTED NOT DETECTED   Enterococcus Faecium NOT DETECTED NOT DETECTED   Listeria monocytogenes NOT DETECTED NOT DETECTED   Staphylococcus species NOT DETECTED NOT DETECTED   Staphylococcus aureus (BCID) NOT DETECTED NOT DETECTED   Staphylococcus epidermidis NOT DETECTED NOT DETECTED   Staphylococcus lugdunensis NOT DETECTED NOT DETECTED   Streptococcus species DETECTED (A) NOT DETECTED   Streptococcus agalactiae NOT DETECTED NOT DETECTED   Streptococcus pneumoniae NOT DETECTED NOT DETECTED   Streptococcus pyogenes DETECTED (A) NOT DETECTED   A.calcoaceticus-baumannii NOT DETECTED NOT DETECTED   Bacteroides fragilis NOT DETECTED NOT DETECTED   Enterobacterales NOT DETECTED NOT DETECTED   Enterobacter cloacae complex NOT DETECTED NOT DETECTED   Escherichia coli NOT DETECTED NOT DETECTED   Klebsiella aerogenes NOT DETECTED NOT DETECTED   Klebsiella oxytoca NOT DETECTED NOT DETECTED   Klebsiella pneumoniae NOT DETECTED NOT DETECTED   Proteus species NOT DETECTED NOT DETECTED   Salmonella species NOT DETECTED NOT DETECTED   Serratia marcescens NOT DETECTED NOT DETECTED   Haemophilus influenzae NOT DETECTED NOT DETECTED   Neisseria meningitidis NOT DETECTED NOT DETECTED   Pseudomonas aeruginosa NOT DETECTED NOT DETECTED   Stenotrophomonas maltophilia NOT DETECTED NOT DETECTED   Candida albicans NOT DETECTED NOT DETECTED   Candida auris NOT DETECTED NOT DETECTED   Candida glabrata NOT DETECTED NOT DETECTED   Candida krusei NOT DETECTED NOT DETECTED   Candida parapsilosis NOT DETECTED NOT DETECTED   Candida tropicalis NOT DETECTED NOT DETECTED   Cryptococcus neoformans/gattii NOT DETECTED NOT DETECTED  MRSA Next Gen by PCR, Nasal   Specimen: Nasal Mucosa; Nasal Swab  Result Value Ref Range   MRSA by PCR Next Gen  NOT DETECTED NOT DETECTED  Urine Culture   Specimen: Urine, Random  Result Value Ref Range   Specimen Description      URINE, RANDOM Performed at [facility], [address] [place], [place] [address]    Special Requests      NONE Performed at [facility], [address], [place], [place] [address]    Culture  NO GROWTH Performed at [facility], [place] [address] [place], [place] [address]    Report Status 05/09/2021 FINAL   Aerobic/Anaerobic Culture w Gram Stain (surgical/deep wound)   Specimen: PATH Other; Tissue  Result Value Ref Range   Specimen Description      LEG LEFT Performed at [facility], [place] [address] [place], [place] [address]    Special Requests PT PREVIOUSLY ON CLINDAMYCIN,PENICILLIN    Gram Stain      RARE WBC PRESENT,BOTH PMN AND MONONUCLEAR NO ORGANISMS SEEN    Culture      No growth aerobically or anaerobically. Performed at [facility], [place] [address] [place], [place] [address]    Report Status 05/13/2021 FINAL   CULTURE, BLOOD (ROUTINE X 2) w Reflex to ID Panel   Specimen: BLOOD  Result Value Ref Range   Specimen Description BLOOD BLOOD LEFT HAND    Special Requests      BOTTLES DRAWN AEROBIC AND ANAEROBIC Blood Culture adequate volume   Culture      NO GROWTH 5 DAYS Performed at [facility], [address] [place], [place] [address]    Report Status 05/14/2021 FINAL   CULTURE, BLOOD (ROUTINE X 2) w Reflex to ID Panel   Specimen: BLOOD  Result Value Ref Range   Specimen Description BLOOD LEFT THUMB    Special Requests      BOTTLES DRAWN AEROBIC AND ANAEROBIC Blood Culture results may not be optimal due to an inadequate volume of blood received in culture bottles   Culture      NO GROWTH 5 DAYS Performed at [facility], [address] [place], [place] [address]    Report Status 05/14/2021 FINAL   Lactic acid, plasma  Result Value Ref Range   Lactic Acid, Venous  3.8 (HH) 0.5 - 1.9 mmol/L  Comprehensive metabolic panel  Result Value Ref Range   Sodium 141 135 - 145 mmol/L   Potassium 2.9 (L) 3.5 - 5.1 mmol/L   Chloride 99 98 - 111 mmol/L   CO2 26 22 - 32 mmol/L   Glucose, Bld 144 (H) 70 - 99 mg/dL   BUN 44 (H) 8 - 23 mg/dL   Creatinine, Ser 3.47 (H) 0.44 - 1.00 mg/dL   Calcium 8.7 (L) 8.9 - 10.3 mg/dL   Total Protein 7.9 6.5 - 8.1 g/dL   Albumin 2.8 (L) 3.5 - 5.0 g/dL   AST 67 (H) 15 - 41 U/L   ALT 48 (H) 0 - 44 U/L   Alkaline Phosphatase 103 38 - 126 U/L   Total Bilirubin 1.1 0.3 - 1.2 mg/dL   GFR, Estimated 13 (L) >60 mL/min   Anion gap 16 (H) 5 - 15  CBC with Differential  Result Value Ref Range   WBC 16.5 (H) 4.0 - 10.5 K/uL   RBC 5.20 (H) 3.87 - 5.11 MIL/uL   Hemoglobin 12.5 12.0 - 15.0 g/dL   HCT 40.8 36.0 - 46.0 %   MCV 78.5 (L) 80.0 - 100.0 fL   MCH 24.0 (L) 26.0 - 34.0 pg   MCHC 30.6 30.0 - 36.0 g/dL   RDW 14.6 11.5 - 15.5 %   Platelets 282 150 - 400 K/uL   nRBC 0.2 0.0 - 0.2 %   Neutrophils Relative % 93 %   Neutro Abs 15.5 (H) 1.7 - 7.7 K/uL   Lymphocytes Relative 2 %   Lymphs Abs 0.3 (L) 0.7 - 4.0 K/uL   Monocytes  Relative 3 %   Monocytes Absolute 0.4 0.1 - 1.0 K/uL   Eosinophils Relative 1 %   Eosinophils Absolute 0.1 0.0 - 0.5 K/uL   Basophils Relative 0 %   Basophils Absolute 0.1 0.0 - 0.1 K/uL   WBC Morphology INCREASED BANDS (>20% BANDS)    Smear Review Normal platelet morphology    Immature Granulocytes 1 %   Abs Immature Granulocytes 0.14 (H) 0.00 - 0.07 K/uL   Burr Cells PRESENT   Lactic acid, plasma  Result Value Ref Range   Lactic Acid, Venous 6.0 (HH) 0.5 - 1.9 mmol/L  Procalcitonin - Baseline  Result Value Ref Range   Procalcitonin 35.76 ng/mL  Procalcitonin  Result Value Ref Range   Procalcitonin 40.92 ng/mL  Lactic acid, plasma  Result Value Ref Range   Lactic Acid, Venous 6.3 (HH) 0.5 - 1.9 mmol/L  Lactic acid, plasma  Result Value Ref Range   Lactic Acid, Venous 4.2 (HH) 0.5 - 1.9 mmol/L   Cortisol  Result Value Ref Range   Cortisol, Plasma 84.1 ug/dL  Protime-INR  Result Value Ref Range   Prothrombin Time 16.1 (H) 11.4 - 15.2 seconds   INR 1.3 (H) 0.8 - 1.2  APTT  Result Value Ref Range   aPTT 36 24 - 36 seconds  D-dimer, quantitative  Result Value Ref Range   D-Dimer, Quant 2.22 (H) 0.00 - 0.50 ug/mL-FEU  CBC  Result Value Ref Range   WBC 17.6 (H) 4.0 - 10.5 K/uL   RBC 4.58 3.87 - 5.11 MIL/uL   Hemoglobin 10.8 (L) 12.0 - 15.0 g/dL   HCT 35.7 (L) 36.0 - 46.0 %   MCV 77.9 (L) 80.0 - 100.0 fL   MCH 23.6 (L) 26.0 - 34.0 pg   MCHC 30.3 30.0 - 36.0 g/dL   RDW 14.8 11.5 - 15.5 %   Platelets 228 150 - 400 K/uL   nRBC 0.2 0.0 - 0.2 %  Basic metabolic panel  Result Value Ref Range   Sodium 140 135 - 145 mmol/L   Potassium 3.3 (L) 3.5 - 5.1 mmol/L   Chloride 106 98 - 111 mmol/L   CO2 21 (L) 22 - 32 mmol/L   Glucose, Bld 114 (H) 70 - 99 mg/dL   BUN 48 (H) 8 - 23 mg/dL   Creatinine, Ser 3.30 (H) 0.44 - 1.00 mg/dL   Calcium 7.8 (L) 8.9 - 10.3 mg/dL   GFR, Estimated 14 (L) >60 mL/min   Anion gap 13 5 - 15  Magnesium  Result Value Ref Range   Magnesium 1.5 (L) 1.7 - 2.4 mg/dL  Phosphorus  Result Value Ref Range   Phosphorus 2.8 2.5 - 4.6 mg/dL  APTT  Result Value Ref Range   aPTT 33 24 - 36 seconds  Lactic acid, plasma  Result Value Ref Range   Lactic Acid, Venous 6.3 (HH) 0.5 - 1.9 mmol/L  Hemoglobin A1c  Result Value Ref Range   Hgb A1c MFr Bld 6.7 (H) 4.8 - 5.6 %   Mean Plasma Glucose 146 mg/dL  Glucose, capillary  Result Value Ref Range   Glucose-Capillary 108 (H) 70 - 99 mg/dL  Basic metabolic panel  Result Value Ref Range   Sodium 139 135 - 145 mmol/L   Potassium 3.8 3.5 - 5.1 mmol/L   Chloride 105 98 - 111 mmol/L   CO2 18 (L) 22 - 32 mmol/L   Glucose, Bld 166 (H) 70 - 99 mg/dL   BUN 54 (H) 8 - 23 mg/dL  Creatinine, Ser 3.43 (H) 0.44 - 1.00 mg/dL   Calcium 7.7 (L) 8.9 - 10.3 mg/dL   GFR, Estimated 14 (L) >60 mL/min   Anion gap 16 (H) 5 - 15   Magnesium  Result Value Ref Range   Magnesium 2.2 1.7 - 2.4 mg/dL  Phosphorus  Result Value Ref Range   Phosphorus 4.5 2.5 - 4.6 mg/dL  Lactic acid, plasma  Result Value Ref Range   Lactic Acid, Venous 3.7 (HH) 0.5 - 1.9 mmol/L  Lactic acid, plasma  Result Value Ref Range   Lactic Acid, Venous 4.1 (HH) 0.5 - 1.9 mmol/L  Glucose, capillary  Result Value Ref Range   Glucose-Capillary 145 (H) 70 - 99 mg/dL  Procalcitonin  Result Value Ref Range   Procalcitonin 66.16 ng/mL  CBC  Result Value Ref Range   WBC 16.2 (H) 4.0 - 10.5 K/uL   RBC 4.40 3.87 - 5.11 MIL/uL   Hemoglobin 10.5 (L) 12.0 - 15.0 g/dL   HCT 33.4 (L) 36.0 - 46.0 %   MCV 75.9 (L) 80.0 - 100.0 fL   MCH 23.9 (L) 26.0 - 34.0 pg   MCHC 31.4 30.0 - 36.0 g/dL   RDW 14.8 11.5 - 15.5 %   Platelets 240 150 - 400 K/uL   nRBC 0.6 (H) 0.0 - 0.2 %  Basic metabolic panel  Result Value Ref Range   Sodium 138 135 - 145 mmol/L   Potassium 4.2 3.5 - 5.1 mmol/L   Chloride 104 98 - 111 mmol/L   CO2 23 22 - 32 mmol/L   Glucose, Bld 189 (H) 70 - 99 mg/dL   BUN 60 (H) 8 - 23 mg/dL   Creatinine, Ser 3.46 (H) 0.44 - 1.00 mg/dL   Calcium 7.4 (L) 8.9 - 10.3 mg/dL   GFR, Estimated 13 (L) >60 mL/min   Anion gap 11 5 - 15  Magnesium  Result Value Ref Range   Magnesium 2.4 1.7 - 2.4 mg/dL  Phosphorus  Result Value Ref Range   Phosphorus 4.4 2.5 - 4.6 mg/dL  Lactic acid, plasma  Result Value Ref Range   Lactic Acid, Venous 2.9 (HH) 0.5 - 1.9 mmol/L  Glucose, capillary  Result Value Ref Range   Glucose-Capillary 166 (H) 70 - 99 mg/dL  Glucose, capillary  Result Value Ref Range   Glucose-Capillary 188 (H) 70 - 99 mg/dL  Glucose, capillary  Result Value Ref Range   Glucose-Capillary 203 (H) 70 - 99 mg/dL  Glucose, capillary  Result Value Ref Range   Glucose-Capillary 153 (H) 70 - 99 mg/dL  Heparin level (unfractionated)  Result Value Ref Range   Heparin Unfractionated 0.45 0.30 - 0.70 IU/mL  CBC  Result Value Ref Range    WBC 21.9 (H) 4.0 - 10.5 K/uL   RBC 4.08 3.87 - 5.11 MIL/uL   Hemoglobin 9.7 (L) 12.0 - 15.0 g/dL   HCT 29.9 (L) 36.0 - 46.0 %   MCV 73.3 (L) 80.0 - 100.0 fL   MCH 23.8 (L) 26.0 - 34.0 pg   MCHC 32.4 30.0 - 36.0 g/dL   RDW 14.8 11.5 - 15.5 %   Platelets 294 150 - 400 K/uL   nRBC 0.5 (H) 0.0 - 0.2 %  Basic metabolic panel  Result Value Ref Range   Sodium 134 (L) 135 - 145 mmol/L   Potassium 3.4 (L) 3.5 - 5.1 mmol/L   Chloride 98 98 - 111 mmol/L   CO2 22 22 - 32 mmol/L   Glucose, Bld 148 (  H) 70 - 99 mg/dL   BUN 69 (H) 8 - 23 mg/dL   Creatinine, Ser 3.32 (H) 0.44 - 1.00 mg/dL   Calcium 7.0 (L) 8.9 - 10.3 mg/dL   GFR, Estimated 14 (L) >60 mL/min   Anion gap 14 5 - 15  Magnesium  Result Value Ref Range   Magnesium 2.5 (H) 1.7 - 2.4 mg/dL  Phosphorus  Result Value Ref Range   Phosphorus 4.5 2.5 - 4.6 mg/dL  Glucose, capillary  Result Value Ref Range   Glucose-Capillary 145 (H) 70 - 99 mg/dL  Heparin level (unfractionated)  Result Value Ref Range   Heparin Unfractionated 0.44 0.30 - 0.70 IU/mL  Urinalysis, Complete w Microscopic  Result Value Ref Range   Color, Urine YELLOW YELLOW   APPearance CLEAR (A) CLEAR   Specific Gravity, Urine 1.015 1.005 - 1.030   pH 5.0 5.0 - 8.0   Glucose, UA NEGATIVE NEGATIVE mg/dL   Hgb urine dipstick MODERATE (A) NEGATIVE   Bilirubin Urine SMALL (A) NEGATIVE   Ketones, ur TRACE (A) NEGATIVE mg/dL   Protein, ur 100 (A) NEGATIVE mg/dL   Nitrite NEGATIVE NEGATIVE   Leukocytes,Ua NEGATIVE NEGATIVE   RBC / HPF 0-5 0 - 5 RBC/hpf   WBC, UA 0-5 0 - 5 WBC/hpf   Bacteria, UA RARE (A) NONE SEEN   Squamous Epithelial / LPF 0-5 0 - 5   Mucus PRESENT   Glucose, capillary  Result Value Ref Range   Glucose-Capillary 142 (H) 70 - 99 mg/dL  Lactic acid, plasma  Result Value Ref Range   Lactic Acid, Venous 1.7 0.5 - 1.9 mmol/L  Lactic acid, plasma  Result Value Ref Range   Lactic Acid, Venous 1.7 0.5 - 1.9 mmol/L  Glucose, capillary  Result Value Ref  Range   Glucose-Capillary 148 (H) 70 - 99 mg/dL  Glucose, capillary  Result Value Ref Range   Glucose-Capillary 111 (H) 70 - 99 mg/dL  CBC  Result Value Ref Range   WBC 23.7 (H) 4.0 - 10.5 K/uL   RBC 3.65 (L) 3.87 - 5.11 MIL/uL   Hemoglobin 9.5 (L) 12.0 - 15.0 g/dL   HCT 27.7 (L) 36.0 - 46.0 %   MCV 75.9 (L) 80.0 - 100.0 fL   MCH 26.0 26.0 - 34.0 pg   MCHC 34.3 30.0 - 36.0 g/dL   RDW 16.1 (H) 11.5 - 15.5 %   Platelets 270 150 - 400 K/uL   nRBC 0.7 (H) 0.0 - 0.2 %  Basic metabolic panel  Result Value Ref Range   Sodium 134 (L) 135 - 145 mmol/L   Potassium 4.1 3.5 - 5.1 mmol/L   Chloride 100 98 - 111 mmol/L   CO2 21 (L) 22 - 32 mmol/L   Glucose, Bld 168 (H) 70 - 99 mg/dL   BUN 71 (H) 8 - 23 mg/dL   Creatinine, Ser 3.48 (H) 0.44 - 1.00 mg/dL   Calcium 7.5 (L) 8.9 - 10.3 mg/dL   GFR, Estimated 13 (L) >60 mL/min   Anion gap 13 5 - 15  Magnesium  Result Value Ref Range   Magnesium 2.5 (H) 1.7 - 2.4 mg/dL  Phosphorus  Result Value Ref Range   Phosphorus 7.7 (H) 2.5 - 4.6 mg/dL  Cortisol-am, blood  Result Value Ref Range   Cortisol - AM 33.9 (H) 6.7 - 22.6 ug/dL  Triglycerides  Result Value Ref Range   Triglycerides 183 (H) <150 mg/dL  Blood gas, arterial  Result Value Ref Range  FIO2 0.60    Delivery systems VENTILATOR    Mode PRESSURE REGULATED VOLUME CONTROL    VT 450 mL   Peep/cpap 5.0 cm H20   pH, Arterial 7.28 (L) 7.350 - 7.450   pCO2 arterial 44 32.0 - 48.0 mmHg   pO2, Arterial 99 83.0 - 108.0 mmHg   Bicarbonate 20.7 20.0 - 28.0 mmol/L   Acid-base deficit 5.8 (H) 0.0 - 2.0 mmol/L   O2 Saturation 96.8 %   Patient temperature 37.0    Collection site RIGHT RADIAL    Sample type ARTERIAL DRAW    Allens test (pass/fail) PASS PASS   Mechanical Rate 18   Glucose, capillary  Result Value Ref Range   Glucose-Capillary 161 (H) 70 - 99 mg/dL  Glucose, capillary  Result Value Ref Range   Glucose-Capillary 125 (H) 70 - 99 mg/dL  CK  Result Value Ref Range   Total  CK 1,575 (H) 38 - 234 U/L  Hepatic function panel  Result Value Ref Range   Total Protein 4.8 (L) 6.5 - 8.1 g/dL   Albumin <1.5 (L) 3.5 - 5.0 g/dL   AST 136 (H) 15 - 41 U/L   ALT 47 (H) 0 - 44 U/L   Alkaline Phosphatase 135 (H) 38 - 126 U/L   Total Bilirubin 3.8 (H) 0.3 - 1.2 mg/dL   Bilirubin, Direct 2.8 (H) 0.0 - 0.2 mg/dL   Indirect Bilirubin 1.0 (H) 0.3 - 0.9 mg/dL  Glucose, capillary  Result Value Ref Range   Glucose-Capillary 120 (H) 70 - 99 mg/dL  CBC  Result Value Ref Range   WBC 22.2 (H) 4.0 - 10.5 K/uL   RBC 3.09 (L) 3.87 - 5.11 MIL/uL   Hemoglobin 8.1 (L) 12.0 - 15.0 g/dL   HCT 23.0 (L) 36.0 - 46.0 %   MCV 74.4 (L) 80.0 - 100.0 fL   MCH 26.2 26.0 - 34.0 pg   MCHC 35.2 30.0 - 36.0 g/dL   RDW 16.9 (H) 11.5 - 15.5 %   Platelets 257 150 - 400 K/uL   nRBC 0.5 (H) 0.0 - 0.2 %  Basic metabolic panel  Result Value Ref Range   Sodium 131 (L) 135 - 145 mmol/L   Potassium 4.3 3.5 - 5.1 mmol/L   Chloride 98 98 - 111 mmol/L   CO2 21 (L) 22 - 32 mmol/L   Glucose, Bld 119 (H) 70 - 99 mg/dL   BUN 77 (H) 8 - 23 mg/dL   Creatinine, Ser 3.88 (H) 0.44 - 1.00 mg/dL   Calcium 7.2 (L) 8.9 - 10.3 mg/dL   GFR, Estimated 12 (L) >60 mL/min   Anion gap 12 5 - 15  Magnesium  Result Value Ref Range   Magnesium 2.7 (H) 1.7 - 2.4 mg/dL  Phosphorus  Result Value Ref Range   Phosphorus 7.8 (H) 2.5 - 4.6 mg/dL  Glucose, capillary  Result Value Ref Range   Glucose-Capillary 104 (H) 70 - 99 mg/dL  Glucose, capillary  Result Value Ref Range   Glucose-Capillary 119 (H) 70 - 99 mg/dL  Glucose, capillary  Result Value Ref Range   Glucose-Capillary 124 (H) 70 - 99 mg/dL  Glucose, capillary  Result Value Ref Range   Glucose-Capillary 111 (H) 70 - 99 mg/dL  CBC  Result Value Ref Range   WBC 23.6 (H) 4.0 - 10.5 K/uL   RBC 2.37 (L) 3.87 - 5.11 MIL/uL   Hemoglobin 6.2 (L) 12.0 - 15.0 g/dL   HCT 17.8 (L) 36.0 - 46.0 %  MCV 75.1 (L) 80.0 - 100.0 fL   MCH 26.2 26.0 - 34.0 pg   MCHC 34.8  30.0 - 36.0 g/dL   RDW 17.9 (H) 11.5 - 15.5 %   Platelets 317 150 - 400 K/uL   nRBC 0.8 (H) 0.0 - 0.2 %  Basic metabolic panel  Result Value Ref Range   Sodium 133 (L) 135 - 145 mmol/L   Potassium 4.6 3.5 - 5.1 mmol/L   Chloride 101 98 - 111 mmol/L   CO2 19 (L) 22 - 32 mmol/L   Glucose, Bld 145 (H) 70 - 99 mg/dL   BUN 83 (H) 8 - 23 mg/dL   Creatinine, Ser 3.68 (H) 0.44 - 1.00 mg/dL   Calcium 7.5 (L) 8.9 - 10.3 mg/dL   GFR, Estimated 13 (L) >60 mL/min   Anion gap 13 5 - 15  Magnesium  Result Value Ref Range   Magnesium 3.0 (H) 1.7 - 2.4 mg/dL  Phosphorus  Result Value Ref Range   Phosphorus 8.9 (H) 2.5 - 4.6 mg/dL  Glucose, capillary  Result Value Ref Range   Glucose-Capillary 128 (H) 70 - 99 mg/dL   Comment 1 Notify RN    Comment 2 Document in Chart   Glucose, capillary  Result Value Ref Range   Glucose-Capillary 121 (H) 70 - 99 mg/dL  Glucose, capillary  Result Value Ref Range   Glucose-Capillary 103 (H) 70 - 99 mg/dL  Hepatic function panel  Result Value Ref Range   Total Protein 5.0 (L) 6.5 - 8.1 g/dL   Albumin <1.5 (L) 3.5 - 5.0 g/dL   AST 97 (H) 15 - 41 U/L   ALT 36 0 - 44 U/L   Alkaline Phosphatase 145 (H) 38 - 126 U/L   Total Bilirubin 1.8 (H) 0.3 - 1.2 mg/dL   Bilirubin, Direct 1.0 (H) 0.0 - 0.2 mg/dL   Indirect Bilirubin 0.8 0.3 - 0.9 mg/dL  Glucose, capillary  Result Value Ref Range   Glucose-Capillary 107 (H) 70 - 99 mg/dL  Cortisol, Random  Result Value Ref Range   Cortisol, Plasma 26.7 ug/dL  Magnesium  Result Value Ref Range   Magnesium 2.9 (H) 1.7 - 2.4 mg/dL  Phosphorus  Result Value Ref Range   Phosphorus 14.2 (H) 2.5 - 4.6 mg/dL  Triglycerides  Result Value Ref Range   Triglycerides 145 <150 mg/dL  Basic metabolic panel  Result Value Ref Range   Sodium 138 135 - 145 mmol/L   Potassium >7.5 (HH) 3.5 - 5.1 mmol/L   Chloride 106 98 - 111 mmol/L   CO2 <7 (L) 22 - 32 mmol/L   Glucose, Bld 24 (LL) 70 - 99 mg/dL   BUN 86 (H) 8 - 23 mg/dL    Creatinine, Ser 4.32 (H) 0.44 - 1.00 mg/dL   Calcium 7.5 (L) 8.9 - 10.3 mg/dL   GFR, Estimated 10 (L) >60 mL/min   Anion gap NOT CALCULATED 5 - 15  Magnesium  Result Value Ref Range   Magnesium 3.6 (H) 1.7 - 2.4 mg/dL  Blood gas, venous  Result Value Ref Range   FIO2 100.00    Mode PRESSURE REGULATED VOLUME CONTROL    VT 460 mL   Peep/cpap 5.0 cm H20   pH, Ven <6.900 (LL) 7.250 - 7.430   pCO2, Ven 38 (L) 44.0 - 60.0 mmHg   pO2, Ven 69.0 (H) 32.0 - 45.0 mmHg   Bicarbonate 7.3 (L) 20.0 - 28.0 mmol/L   Acid-base deficit 24.1 (H) 0.0 - 2.0  mmol/L   O2 Saturation 74.6 %   Patient temperature 37.0    Collection site VEIN    Sample type VENOUS    Mechanical Rate 20   Phosphorus  Result Value Ref Range   Phosphorus 17.5 (H) 2.5 - 4.6 mg/dL  CBC  Result Value Ref Range   WBC 40.9 (H) 4.0 - 10.5 K/uL   RBC 1.88 (L) 3.87 - 5.11 MIL/uL   Hemoglobin 4.9 (LL) 12.0 - 15.0 g/dL   HCT 16.3 (L) 36.0 - 46.0 %   MCV 86.7 80.0 - 100.0 fL   MCH 26.1 26.0 - 34.0 pg   MCHC 30.1 30.0 - 36.0 g/dL   RDW 19.9 (H) 11.5 - 15.5 %   Platelets 267 150 - 400 K/uL   nRBC 3.2 (H) 0.0 - 0.2 %  Pathologist smear review  Result Value Ref Range   Path Review Blood smear is reviewed.   Glucose, capillary  Result Value Ref Range   Glucose-Capillary 25 (LL) 70 - 99 mg/dL   Comment 1 Repeat Test   Comprehensive metabolic panel  Result Value Ref Range   Sodium 137 135 - 145 mmol/L   Potassium 5.4 (H) 3.5 - 5.1 mmol/L   Chloride 98 98 - 111 mmol/L   CO2 15 (L) 22 - 32 mmol/L   Glucose, Bld 368 (H) 70 - 99 mg/dL   BUN 92 (H) 8 - 23 mg/dL   Creatinine, Ser 4.21 (H) 0.44 - 1.00 mg/dL   Calcium 6.8 (L) 8.9 - 10.3 mg/dL   Total Protein 4.6 (L) 6.5 - 8.1 g/dL   Albumin <1.5 (L) 3.5 - 5.0 g/dL   AST 4,359 (H) 15 - 41 U/L   ALT 1,746 (H) 0 - 44 U/L   Alkaline Phosphatase 185 (H) 38 - 126 U/L   Total Bilirubin 2.5 (H) 0.3 - 1.2 mg/dL   GFR, Estimated 11 (L) >60 mL/min   Anion gap 24 (H) 5 - 15  CBC with  Differential/Platelet  Result Value Ref Range   WBC 42.8 (H) 4.0 - 10.5 K/uL   RBC 3.76 (L) 3.87 - 5.11 MIL/uL   Hemoglobin 10.8 (L) 12.0 - 15.0 g/dL   HCT 33.2 (L) 36.0 - 46.0 %   MCV 88.3 80.0 - 100.0 fL   MCH 28.7 26.0 - 34.0 pg   MCHC 32.5 30.0 - 36.0 g/dL   RDW 18.6 (H) 11.5 - 15.5 %   Platelets 219 150 - 400 K/uL   nRBC 5.4 (H) 0.0 - 0.2 %   Neutrophils Relative % 80 %   Neutro Abs 34.4 (H) 1.7 - 7.7 K/uL   Lymphocytes Relative 4 %   Lymphs Abs 1.6 0.7 - 4.0 K/uL   Monocytes Relative 2 %   Monocytes Absolute 0.9 0.1 - 1.0 K/uL   Eosinophils Relative 0 %   Eosinophils Absolute 0.0 0.0 - 0.5 K/uL   Basophils Relative 0 %   Basophils Absolute 0.1 0.0 - 0.1 K/uL   WBC Morphology MILD LEFT SHIFT (1-5% METAS, OCC MYELO, OCC BANDS)    Smear Review Normal platelet morphology    Immature Granulocytes 14 %   Abs Immature Granulocytes 5.79 (H) 0.00 - 0.07 K/uL   Burr Cells PRESENT    Polychromasia PRESENT   CBC  Result Value Ref Range   WBC 42.7 (H) 4.0 - 10.5 K/uL   RBC 3.52 (L) 3.87 - 5.11 MIL/uL   Hemoglobin 9.7 (L) 12.0 - 15.0 g/dL   HCT 32.2 (L) 36.0 -  46.0 %   MCV 91.5 80.0 - 100.0 fL   MCH 27.6 26.0 - 34.0 pg   MCHC 30.1 30.0 - 36.0 g/dL   RDW 18.5 (H) 11.5 - 15.5 %   Platelets 208 150 - 400 K/uL   nRBC 2.6 (H) 0.0 - 0.2 %  Basic metabolic panel  Result Value Ref Range   Sodium 135 135 - 145 mmol/L   Potassium 6.0 (H) 3.5 - 5.1 mmol/L   Chloride 99 98 - 111 mmol/L   CO2 10 (L) 22 - 32 mmol/L   Glucose, Bld 343 (H) 70 - 99 mg/dL   BUN 84 (H) 8 - 23 mg/dL   Creatinine, Ser 4.12 (H) 0.44 - 1.00 mg/dL   Calcium 7.0 (L) 8.9 - 10.3 mg/dL   GFR, Estimated 11 (L) >60 mL/min   Anion gap 26 (H) 5 - 15  Blood gas, venous  Result Value Ref Range   pH, Ven 7.04 (LL) 7.250 - 7.430   pCO2, Ven 33 (L) 44.0 - 60.0 mmHg   pO2, Ven 89.0 (H) 32.0 - 45.0 mmHg   Bicarbonate 8.9 (L) 20.0 - 28.0 mmol/L   Acid-base deficit 20.6 (H) 0.0 - 2.0 mmol/L   O2 Saturation 91.3 %    Patient temperature 37.0    Collection site LINE    Sample type VENOUS   Glucose, capillary  Result Value Ref Range   Glucose-Capillary 169 (H) 70 - 99 mg/dL  Glucose, capillary  Result Value Ref Range   Glucose-Capillary 191 (H) 70 - 99 mg/dL  Glucose, capillary  Result Value Ref Range   Glucose-Capillary 231 (H) 70 - 99 mg/dL  Glucose, capillary  Result Value Ref Range   Glucose-Capillary 239 (H) 70 - 99 mg/dL  Glucose, capillary  Result Value Ref Range   Glucose-Capillary 285 (H) 70 - 99 mg/dL  Glucose, capillary  Result Value Ref Range   Glucose-Capillary 301 (H) 70 - 99 mg/dL  Blood gas, venous  Result Value Ref Range   FIO2 100.00    Mode PRESSURE REGULATED VOLUME CONTROL    VT 460 mL   Peep/cpap 5.0 cm H20   pH, Ven 7.25 7.250 - 7.430   pCO2, Ven 30 (L) 44.0 - 60.0 mmHg   pO2, Ven 126.0 (H) 32.0 - 45.0 mmHg   Bicarbonate 13.2 (L) 20.0 - 28.0 mmol/L   Acid-base deficit 12.8 (H) 0.0 - 2.0 mmol/L   O2 Saturation 98.3 %   Patient temperature 37.0    Collection site VEIN    Sample type VENOUS    Mechanical Rate 20   Hemoglobin and hematocrit, blood  Result Value Ref Range   Hemoglobin 9.9 (L) 12.0 - 15.0 g/dL   HCT 29.8 (L) 36.0 - 46.0 %  Glucose, capillary  Result Value Ref Range   Glucose-Capillary 270 (H) 70 - 99 mg/dL  Glucose, capillary  Result Value Ref Range   Glucose-Capillary 299 (H) 70 - 99 mg/dL  Renal function panel (daily at 1600)  Result Value Ref Range   Sodium 135 135 - 145 mmol/L   Potassium 4.5 3.5 - 5.1 mmol/L   Chloride 96 (L) 98 - 111 mmol/L   CO2 22 22 - 32 mmol/L   Glucose, Bld 204 (H) 70 - 99 mg/dL   BUN 89 (H) 8 - 23 mg/dL   Creatinine, Ser 4.00 (H) 0.44 - 1.00 mg/dL   Calcium 5.5 (LL) 8.9 - 10.3 mg/dL   Phosphorus 10.8 (H) 2.5 - 4.6 mg/dL  Albumin <1.5 (L) 3.5 - 5.0 g/dL   GFR, Estimated 11 (L) >60 mL/min   Anion gap 17 (H) 5 - 15  Glucose, capillary  Result Value Ref Range   Glucose-Capillary 299 (H) 70 - 99 mg/dL  Glucose,  capillary  Result Value Ref Range   Glucose-Capillary 304 (H) 70 - 99 mg/dL  Glucose, capillary  Result Value Ref Range   Glucose-Capillary 264 (H) 70 - 99 mg/dL  Glucose, capillary  Result Value Ref Range   Glucose-Capillary 205 (H) 70 - 99 mg/dL  Glucose, capillary  Result Value Ref Range   Glucose-Capillary 199 (H) 70 - 99 mg/dL  Renal function panel (daily at 0500)  Result Value Ref Range   Sodium 132 (L) 135 - 145 mmol/L   Potassium 4.1 3.5 - 5.1 mmol/L   Chloride 98 98 - 111 mmol/L   CO2 25 22 - 32 mmol/L   Glucose, Bld 143 (H) 70 - 99 mg/dL   BUN 53 (H) 8 - 23 mg/dL   Creatinine, Ser 2.38 (H) 0.44 - 1.00 mg/dL   Calcium 6.3 (LL) 8.9 - 10.3 mg/dL   Phosphorus 6.1 (H) 2.5 - 4.6 mg/dL   Albumin 2.0 (L) 3.5 - 5.0 g/dL   GFR, Estimated 21 (L) >60 mL/min   Anion gap 9 5 - 15  Glucose, capillary  Result Value Ref Range   Glucose-Capillary 165 (H) 70 - 99 mg/dL  Glucose, capillary  Result Value Ref Range   Glucose-Capillary 114 (H) 70 - 99 mg/dL  Basic metabolic panel  Result Value Ref Range   Sodium 136 135 - 145 mmol/L   Potassium 4.6 3.5 - 5.1 mmol/L   Chloride 95 (L) 98 - 111 mmol/L   CO2 25 22 - 32 mmol/L   Glucose, Bld 123 (H) 70 - 99 mg/dL   BUN 72 (H) 8 - 23 mg/dL   Creatinine, Ser 3.28 (H) 0.44 - 1.00 mg/dL   Calcium 6.7 (L) 8.9 - 10.3 mg/dL   GFR, Estimated 14 (L) >60 mL/min   Anion gap 16 (H) 5 - 15  Magnesium  Result Value Ref Range   Magnesium 2.2 1.7 - 2.4 mg/dL  Phosphorus  Result Value Ref Range   Phosphorus 8.4 (H) 2.5 - 4.6 mg/dL  Hemoglobin and hematocrit, blood  Result Value Ref Range   Hemoglobin 9.9 (L) 12.0 - 15.0 g/dL   HCT 30.1 (L) 36.0 - 46.0 %  Hepatic function panel  Result Value Ref Range   Total Protein 5.3 (L) 6.5 - 8.1 g/dL   Albumin 2.1 (L) 3.5 - 5.0 g/dL   AST 6,519 (H) 15 - 41 U/L   ALT 1,613 (H) 0 - 44 U/L   Alkaline Phosphatase 232 (H) 38 - 126 U/L   Total Bilirubin 3.0 (H) 0.3 - 1.2 mg/dL   Bilirubin, Direct 2.0 (H)  0.0 - 0.2 mg/dL   Indirect Bilirubin 1.0 (H) 0.3 - 0.9 mg/dL  Glucose, capillary  Result Value Ref Range   Glucose-Capillary 119 (H) 70 - 99 mg/dL  Glucose, capillary  Result Value Ref Range   Glucose-Capillary 124 (H) 70 - 99 mg/dL  Renal function panel (daily at 1600)  Result Value Ref Range   Sodium 132 (L) 135 - 145 mmol/L   Potassium 4.4 3.5 - 5.1 mmol/L   Chloride 94 (L) 98 - 111 mmol/L   CO2 26 22 - 32 mmol/L   Glucose, Bld 162 (H) 70 - 99 mg/dL   BUN 44 (H) 8 -  23 mg/dL   Creatinine, Ser 2.04 (H) 0.44 - 1.00 mg/dL   Calcium 7.8 (L) 8.9 - 10.3 mg/dL   Phosphorus 4.9 (H) 2.5 - 4.6 mg/dL   Albumin 2.7 (L) 3.5 - 5.0 g/dL   GFR, Estimated 25 (L) >60 mL/min   Anion gap 12 5 - 15  Glucose, capillary  Result Value Ref Range   Glucose-Capillary 142 (H) 70 - 99 mg/dL  Glucose, capillary  Result Value Ref Range   Glucose-Capillary 160 (H) 70 - 99 mg/dL  Glucose, capillary  Result Value Ref Range   Glucose-Capillary 158 (H) 70 - 99 mg/dL  CBC  Result Value Ref Range   WBC 40.8 (H) 4.0 - 10.5 K/uL   RBC 2.86 (L) 3.87 - 5.11 MIL/uL   Hemoglobin 8.2 (L) 12.0 - 15.0 g/dL   HCT 24.5 (L) 36.0 - 46.0 %   MCV 85.7 80.0 - 100.0 fL   MCH 28.7 26.0 - 34.0 pg   MCHC 33.5 30.0 - 36.0 g/dL   RDW 19.2 (H) 11.5 - 15.5 %   Platelets 187 150 - 400 K/uL   nRBC 13.7 (H) 0.0 - 0.2 %  Comprehensive metabolic panel  Result Value Ref Range   Sodium 132 (L) 135 - 145 mmol/L   Potassium 4.4 3.5 - 5.1 mmol/L   Chloride 94 (L) 98 - 111 mmol/L   CO2 27 22 - 32 mmol/L   Glucose, Bld 155 (H) 70 - 99 mg/dL   BUN 50 (H) 8 - 23 mg/dL   Creatinine, Ser 2.29 (H) 0.44 - 1.00 mg/dL   Calcium 7.6 (L) 8.9 - 10.3 mg/dL   Total Protein 6.3 (L) 6.5 - 8.1 g/dL   Albumin 2.5 (L) 3.5 - 5.0 g/dL   AST 6,250 (H) 15 - 41 U/L   ALT 1,711 (H) 0 - 44 U/L   Alkaline Phosphatase 262 (H) 38 - 126 U/L   Total Bilirubin 3.9 (H) 0.3 - 1.2 mg/dL   GFR, Estimated 22 (L) >60 mL/min   Anion gap 11 5 - 15  Magnesium   Result Value Ref Range   Magnesium 2.1 1.7 - 2.4 mg/dL  Phosphorus  Result Value Ref Range   Phosphorus 5.8 (H) 2.5 - 4.6 mg/dL  Glucose, capillary  Result Value Ref Range   Glucose-Capillary 213 (H) 70 - 99 mg/dL  Glucose, capillary  Result Value Ref Range   Glucose-Capillary 152 (H) 70 - 99 mg/dL  Renal function panel (daily at 0500)  Result Value Ref Range   Sodium 131 (L) 135 - 145 mmol/L   Potassium 4.0 3.5 - 5.1 mmol/L   Chloride 98 98 - 111 mmol/L   CO2 26 22 - 32 mmol/L   Glucose, Bld 159 (H) 70 - 99 mg/dL   BUN 36 (H) 8 - 23 mg/dL   Creatinine, Ser 1.77 (H) 0.44 - 1.00 mg/dL   Calcium 8.1 (L) 8.9 - 10.3 mg/dL   Phosphorus 3.4 2.5 - 4.6 mg/dL   Albumin 2.4 (L) 3.5 - 5.0 g/dL   GFR, Estimated 30 (L) >60 mL/min   Anion gap 7 5 - 15  Magnesium  Result Value Ref Range   Magnesium 1.8 1.7 - 2.4 mg/dL  CBC  Result Value Ref Range   WBC 34.8 (H) 4.0 - 10.5 K/uL   RBC 2.53 (L) 3.87 - 5.11 MIL/uL   Hemoglobin 7.3 (L) 12.0 - 15.0 g/dL   HCT 22.2 (L) 36.0 - 46.0 %   MCV 87.7 80.0 - 100.0  fL   MCH 28.9 26.0 - 34.0 pg   MCHC 32.9 30.0 - 36.0 g/dL   RDW 19.9 (H) 11.5 - 15.5 %   Platelets 141 (L) 150 - 400 K/uL   nRBC 13.6 (H) 0.0 - 0.2 %  Procalcitonin  Result Value Ref Range   Procalcitonin 20.94 ng/mL  Sedimentation rate  Result Value Ref Range   Sed Rate 59 (H) 0 - 22 mm/hr  CK  Result Value Ref Range   Total CK 4,670 (H) 38 - 234 U/L  Glucose, capillary  Result Value Ref Range   Glucose-Capillary 156 (H) 70 - 99 mg/dL  Hepatic function panel  Result Value Ref Range   Total Protein 5.9 (L) 6.5 - 8.1 g/dL   Albumin 2.4 (L) 3.5 - 5.0 g/dL   AST 4,114 (H) 15 - 41 U/L   ALT 1,400 (H) 0 - 44 U/L   Alkaline Phosphatase 266 (H) 38 - 126 U/L   Total Bilirubin 4.0 (H) 0.3 - 1.2 mg/dL   Bilirubin, Direct 2.7 (H) 0.0 - 0.2 mg/dL   Indirect Bilirubin 1.3 (H) 0.3 - 0.9 mg/dL  Glucose, capillary  Result Value Ref Range   Glucose-Capillary 176 (H) 70 - 99 mg/dL  Renal  function panel (daily at 1600)  Result Value Ref Range   Sodium 133 (L) 135 - 145 mmol/L   Potassium 3.9 3.5 - 5.1 mmol/L   Chloride 97 (L) 98 - 111 mmol/L   CO2 25 22 - 32 mmol/L   Glucose, Bld 146 (H) 70 - 99 mg/dL   BUN 31 (H) 8 - 23 mg/dL   Creatinine, Ser 1.56 (H) 0.44 - 1.00 mg/dL   Calcium 8.8 (L) 8.9 - 10.3 mg/dL   Phosphorus 2.8 2.5 - 4.6 mg/dL   Albumin 2.6 (L) 3.5 - 5.0 g/dL   GFR, Estimated 35 (L) >60 mL/min   Anion gap 11 5 - 15  Glucose, capillary  Result Value Ref Range   Glucose-Capillary 155 (H) 70 - 99 mg/dL  Hemoglobin and hematocrit, blood  Result Value Ref Range   Hemoglobin 7.6 (L) 12.0 - 15.0 g/dL   HCT 22.4 (L) 36.0 - 46.0 %  Glucose, capillary  Result Value Ref Range   Glucose-Capillary 142 (H) 70 - 99 mg/dL  TSH  Result Value Ref Range   TSH 0.196 (L) 0.350 - 4.500 uIU/mL  Glucose, capillary  Result Value Ref Range   Glucose-Capillary 165 (H) 70 - 99 mg/dL  Glucose, capillary  Result Value Ref Range   Glucose-Capillary 146 (H) 70 - 99 mg/dL  Renal function panel (daily at 0500)  Result Value Ref Range   Sodium 133 (L) 135 - 145 mmol/L   Potassium 3.6 3.5 - 5.1 mmol/L   Chloride 98 98 - 111 mmol/L   CO2 26 22 - 32 mmol/L   Glucose, Bld 193 (H) 70 - 99 mg/dL   BUN 33 (H) 8 - 23 mg/dL   Creatinine, Ser 1.45 (H) 0.44 - 1.00 mg/dL   Calcium 9.1 8.9 - 10.3 mg/dL   Phosphorus 2.3 (L) 2.5 - 4.6 mg/dL   Albumin 3.0 (L) 3.5 - 5.0 g/dL   GFR, Estimated 38 (L) >60 mL/min   Anion gap 9 5 - 15  Magnesium  Result Value Ref Range   Magnesium 1.9 1.7 - 2.4 mg/dL  Procalcitonin  Result Value Ref Range   Procalcitonin 21.12 ng/mL  Blood gas, arterial  Result Value Ref Range   FIO2 0.50  Delivery systems VENTILATOR    Mode PRESSURE REGULATED VOLUME CONTROL    VT 460 mL   Peep/cpap 5.0 cm H20   pH, Arterial 7.40 7.350 - 7.450   pCO2 arterial 45 32.0 - 48.0 mmHg   pO2, Arterial 129 (H) 83.0 - 108.0 mmHg   Bicarbonate 27.9 20.0 - 28.0 mmol/L    Acid-Base Excess 2.8 (H) 0.0 - 2.0 mmol/L   O2 Saturation 98.9 %   Patient temperature 37.0    Collection site RIGHT RADIAL    Sample type ARTERIAL DRAW    Allens test (pass/fail) PASS PASS   Mechanical Rate 20   T4, free  Result Value Ref Range   Free T4 0.48 (L) 0.61 - 1.12 ng/dL  CBC  Result Value Ref Range   WBC 28.4 (H) 4.0 - 10.5 K/uL   RBC 2.37 (L) 3.87 - 5.11 MIL/uL   Hemoglobin 6.7 (L) 12.0 - 15.0 g/dL   HCT 20.5 (L) 36.0 - 46.0 %   MCV 86.5 80.0 - 100.0 fL   MCH 28.3 26.0 - 34.0 pg   MCHC 32.7 30.0 - 36.0 g/dL   RDW 20.4 (H) 11.5 - 15.5 %   Platelets 121 (L) 150 - 400 K/uL   nRBC 14.0 (H) 0.0 - 0.2 %  Glucose, capillary  Result Value Ref Range   Glucose-Capillary 152 (H) 70 - 99 mg/dL   Comment 1 Document in Chart   Glucose, capillary  Result Value Ref Range   Glucose-Capillary 172 (H) 70 - 99 mg/dL  Renal function panel (daily at 1600)  Result Value Ref Range   Sodium 129 (L) 135 - 145 mmol/L   Potassium 3.9 3.5 - 5.1 mmol/L   Chloride 96 (L) 98 - 111 mmol/L   CO2 23 22 - 32 mmol/L   Glucose, Bld 275 (H) 70 - 99 mg/dL   BUN 39 (H) 8 - 23 mg/dL   Creatinine, Ser 1.75 (H) 0.44 - 1.00 mg/dL   Calcium 9.2 8.9 - 10.3 mg/dL   Phosphorus 2.6 2.5 - 4.6 mg/dL   Albumin 2.9 (L) 3.5 - 5.0 g/dL   GFR, Estimated 31 (L) >60 mL/min   Anion gap 10 5 - 15  Glucose, capillary  Result Value Ref Range   Glucose-Capillary 117 (H) 70 - 99 mg/dL  Glucose, capillary  Result Value Ref Range   Glucose-Capillary 191 (H) 70 - 99 mg/dL  Glucose, capillary  Result Value Ref Range   Glucose-Capillary 177 (H) 70 - 99 mg/dL  Hemoglobin and hematocrit, blood  Result Value Ref Range   Hemoglobin 8.0 (L) 12.0 - 15.0 g/dL   HCT 23.5 (L) 36.0 - 46.0 %  Protime-INR  Result Value Ref Range   Prothrombin Time 21.0 (H) 11.4 - 15.2 seconds   INR 1.8 (H) 0.8 - 1.2  APTT  Result Value Ref Range   aPTT 45 (H) 24 - 36 seconds  Glucose, capillary  Result Value Ref Range    Glucose-Capillary 218 (H) 70 - 99 mg/dL  Glucose, capillary  Result Value Ref Range   Glucose-Capillary 246 (H) 70 - 99 mg/dL  CBG monitoring, ED  Result Value Ref Range   Glucose-Capillary 123 (H) 70 - 99 mg/dL  CBG monitoring, ED  Result Value Ref Range   Glucose-Capillary 102 (H) 70 - 99 mg/dL  ECHOCARDIOGRAM COMPLETE BUBBLE STUDY  Result Value Ref Range   Ao pk vel 1.49 m/s   AV Area VTI 2.75 cm2   AR max vel 2.40 cm2  AV Mean grad 5.0 mmHg   AV Peak grad 8.9 mmHg   S' Lateral 2.10 cm   AV Area mean vel 2.31 cm2   Area-P 1/2 6.07 cm2  Type and screen [place]  Result Value Ref Range   ABO/RH(D) O POS    Antibody Screen NEG    Sample Expiration 05/07/2021,2359    Unit Number [id number]    Blood Component Type RED CELLS,LR    Unit division 00    Status of Unit ISSUED,FINAL    Transfusion Status OK TO TRANSFUSE    Crossmatch Result COMPATIBLE    Unit tag comment      EMERGENCY RELEASE Performed at [facility], [address] [place], [place] [address]    Unit Number [id number]    Blood Component Type RED CELLS,LR    Unit division 00    Status of Unit ISSUED,FINAL    Transfusion Status OK TO TRANSFUSE    Crossmatch Result COMPATIBLE    Unit tag comment EMERGENCY RELEASE   Prepare RBC (crossmatch)  Result Value Ref Range   Order Confirmation      ORDER PROCESSED BY BLOOD BANK Performed at [facility], [address] [place], [place] [address]   Type and screen  Result Value Ref Range   ABO/RH(D) O POS    Antibody Screen NEG    Sample Expiration 05/11/2021,2359    Unit Number [id number]    Blood Component Type RED CELLS,LR    Unit division 00    Status of Unit ISSUED,FINAL    Transfusion Status OK TO TRANSFUSE    Crossmatch Result      Compatible Performed at [facility], [address], [place], [place] [address]    Unit Number [id number]    Blood Component Type RED CELLS,LR    Unit  division 00    Status of Unit ISSUED,FINAL    Transfusion Status OK TO TRANSFUSE    Crossmatch Result Compatible    Unit Number [id number]    Blood Component Type RED CELLS,LR    Unit division 00    Status of Unit ISSUED,FINAL    Transfusion Status OK TO TRANSFUSE    Crossmatch Result Compatible    Unit Number [id number]    Blood Component Type RED CELLS,LR    Unit division 00    Status of Unit ISSUED,FINAL    Transfusion Status OK TO TRANSFUSE    Crossmatch Result Compatible    Unit Number [id number]    Blood Component Type RED CELLS,LR    Unit division 00    Status of Unit ISSUED,FINAL    Transfusion Status OK TO TRANSFUSE    Crossmatch Result Compatible   Prepare RBC (crossmatch)  Result Value Ref Range   Order Confirmation      ORDER PROCESSED BY BLOOD BANK Performed at [facility], [address] [place], [place] [address]   Prepare RBC (crossmatch)  Result Value Ref Range   Order Confirmation      ORDER PROCESSED BY BLOOD BANK Performed at [facility], [address] [place], [place] [address]   Type and screen [place]  Result Value Ref Range   ABO/RH(D) O POS    Antibody Screen NEG    Sample Expiration 05/16/2021,2359    Unit Number [id number]    Blood Component Type RBC LR PHER2    Unit division 00    Status of Unit ISSUED    Transfusion Status OK  TO TRANSFUSE    Crossmatch Result      Compatible Performed at [facility], [address] [place], [place] [address]   Prepare RBC (crossmatch)  Result Value Ref Range   Order Confirmation      ORDER PROCESSED BY BLOOD BANK Performed at [facility], [address] [place], [place] [address]   Surgical pathology  Result Value Ref Range   SURGICAL PATHOLOGY      SURGICAL PATHOLOGY CASE: [id number] PATIENT: Alverda Skeans Surgical Pathology Report     Specimen Submitted: A. Fasciitis, left leg  Clinical History:  Necrotizing fasciitis; left lower extremity      DIAGNOSIS: A. SKIN AND SOFT TISSUE, LEFT LEG; DEBRIDEMENT: - SKIN AND SOFT TISSUE WITH NECROSIS, MIXED INFLAMMATION, AND ABSCESS FORMATION.  GROSS DESCRIPTION: A. Labeled: Left leg fasciitis Received: Fresh Collection time: 9:29 PM on 05/07/2021 Placed into formalin time: 10:47 PM on 05/07/2021 Tissue fragment(s): Multiple Size: Aggregate, 16.0 x 16.0 x 5.5 cm Description: Received are multiple fragments of dull, hemorrhagic skin and soft tissue.  The skin fragments show areas of sloughing. Representative sections are submitted in 2 cassettes.  CM 05/08/2021  Additional sections submitted in A3-A6. CM 05/09/2021  Final Diagnosis performed by Quay Burow, MD.   Electronically signed 05/10/2021 2:02:42PM The electronic signature indicates that the named  Attending Pathologist has evaluated the specimen Technical component performed at [facility], [address] [place], [place] [address] Lab: [phone] Dir: Rush Farmer, MD, MMM  Professional component performed at [facility], [facility], [place], [place], [place] [address] Lab: [phone] Dir: Kathi Simpers, MD   Surgical pathology  Result Value Ref Range   SURGICAL PATHOLOGY      SURGICAL PATHOLOGY CASE: [id number] PATIENT: Alverda Skeans Surgical Pathology Report     Specimen Submitted: A. Skin, left leg  Clinical History: Left leg necrotizing fasciitis      DIAGNOSIS: A. SKIN AND SOFT TISSUE, LEFT LEG; DEBRIDEMENT: - DIFFUSE CELLULITIS WITH DEEP SOFT TISSUE NECROSIS AND PARTIAL EPIDERMAL NECROSIS.  GROSS DESCRIPTION: A. Labeled: Left leg skin Received: Fresh Collection time: 1:02 PM on 05/10/2021 Placed into formalin time: 3:03 PM on 05/10/2021 Tissue fragment(s): 1 Size: 25 x 16 x 2.5 cm Description: Received is a fragment of skin and underlying dull soft tissue.  The skin has multiple areas of sloughing. Representative  sections are submitted in 2 cassettes.  CM 05/13/2021  Final Diagnosis performed by Bryan Lemma, MD.   Electronically signed 05/14/2021 1:01:14PM The electronic signature indicates that the named Attending Pathologist has evaluated the specimen Technical component performed at [facility], [address] [place], [place] [address] Lab: [phone] Dir: Rush Farmer, MD, MMM  Professional component performed at [facility], [facility], [place], [place], [place] [address] Lab: [phone] Dir: Kathi Simpers, MD   [facility]  Result Value Ref Range   ISSUE DATE / TIME [id number]    Blood Product Unit Number [id number]    Unit Type and Rh 5100    Blood Product Expiration Date 488891694503    ISSUE DATE / TIME [id number]    Blood Product Unit Number [id number]    Unit Type and Rh 5100    Blood Product Expiration Date 655374827078   [facility] RBC  Result Value Ref Range   ISSUE DATE / TIME 675449201007    Blood Product Unit Number [id number]    PRODUCT CODE [id number]    Unit Type and Rh 5100    Blood Product Expiration Date  500938182993    ISSUE DATE / TIME 716967893810    Blood Product Unit Number [id number]    PRODUCT CODE [id number]    Unit Type and Rh 5100    Blood Product Expiration Date 443154008676    ISSUE DATE / TIME 195093267124    Blood Product Unit Number [id number]    PRODUCT CODE [id number]    Unit Type and Rh 5100    Blood Product Expiration Date 937902409735    ISSUE DATE / TIME 329924268341    Blood Product Unit Number [id number]    PRODUCT CODE [id number]    Unit Type and Rh 5100    Blood Product Expiration Date 448185631497    ISSUE DATE / TIME 026378588502    Blood Product Unit Number [id number]    PRODUCT CODE [id number]    Unit Type and Rh 5100    Blood Product Expiration Date 836629476546   [facility] RBC  Result Value Ref Range   ISSUE DATE / TIME 503546568127    Blood Product Unit Number [id number]    PRODUCT CODE  [id number]    Unit Type and Rh 5100    Blood Product Expiration Date 665993570177     Imaging:   I personally reviewed radiology studies to include:  CT head 05/14/21 IMPRESSION: Hyperintensity left middle frontal sulcus compatible with acute subarachnoid hemorrhage, small volume.   No other acute intracranial abnormality. No hydrocephalus or infarct.   Extensive paranasal sinus mucosal disease with air-fluid levels.   Electronically Signed: By: Franchot Gallo M.D. On: 05/14/2021 15:59  MRI brain 05/14/21  IMPRESSION: 1. Sulcal FLAIR hyperintensity overlying bilateral frontal lobe sulci may be artifactual related to intubation; however, subarachnoid hemorrhage can have a similar appearance. Differential also includes meningitis. Recommend noncontrast CT for further evaluation for subarachnoid hemorrhage. Consider correlation with lumbar puncture to evaluate for meningitis as indicated, if the CT head is negative for hemorrhage. 2. Fluid in the bilateral occipital scalp.   These results were called by telephone at the time of interpretation on 05/14/2021 at 3:10 pm to provider DANA GRAVES NP, who verbally acknowledged these results.     Electronically Signed   By: Valetta Mole M.D.   On: 05/14/2021 15:11  Impression/Plan:     1.  Diagnosis: small volume subarachnoid hemorrhage overlying left middle frontal sulcus. Question aneurysmal versus spontaneous.  2.  Plan -MRA for further evaluation - continue to avoid sedation if possible -No plan for neurosurgical intervention at this time - further plan per CC - please call with any questions or concerns.  Cooper Render PA-C Neurosurgery

## 2021-05-14 NOTE — Progress Notes (Signed)
Dr. Mortimer Fries rounding on patient and wants her ventilator switched back to [facility] mode.  Switched with MD present and called Heather RT to notify.  Also wanted to start Propofol gtt and give Fentanyl PRN for comfort.

## 2021-05-14 NOTE — Progress Notes (Signed)
Date of Admission:  05/04/2021    ID: Alexandra Foster is a 71 y.o. female  Principal Problem:   Severe sepsis with septic shock (CODE) ([place]) Active Problems:   COPD (chronic obstructive pulmonary disease) ([place])   Morbid obesity ([place])   Diabetes mellitus type 2 in obese [facility])   Atrial fibrillation and flutter ([facility])   Necrotizing fasciitis ([place])   Acute on chronic respiratory failure ([place])   On mechanically assisted ventilation ([place])   Shock liver   Anemia associated with acute blood loss   Metabolic acidosis   Encounter for continuous renal replacement therapy (CRRT) for acute renal failure ([facility])   Acute encephalopathy   Group A streptococcal infection   Streptococcal toxic shock syndrome ([place])    Subjective:  Pt remains intubated No urine output On CRRT On one pressor  Restarted amoidarone today for afib Had MRI brain    Objective: Vital signs in last 24 hours:Patient Vitals for the past 24 hrs:  BP Temp Temp src Pulse Resp SpO2 Weight  05/14/21 0900 132/70 (!) 97.3 F (36.3 C) -- (!) 161 18 99 % --  05/14/21 0847 (!) 137/58 (!) 97.3 F (36.3 C) -- 88 12 99 % --  05/14/21 0837 (!) 137/52 (!) 97.3 F (36.3 C) -- 87 20 100 % --  05/14/21 0807 135/62 (!) 97.2 F (36.2 C) Esophageal 85 19 93 % --  05/14/21 0800 (!) 135/59 (!) 97 F (36.1 C) -- 89 (!) 21 93 % --  05/14/21 0730 (!) 133/52 (!) 97.2 F (36.2 C) Esophageal 87 18 94 % --  05/14/21 0722 -- -- -- -- -- 92 % --  05/14/21 0715 (!) 129/53 (!) 97 F (36.1 C) -- 84 17 93 % --  05/14/21 0600 (!) 127/50 (!) 97.2 F (36.2 C) -- 85 16 92 % --  05/14/21 0500 114/61 97.7 F (36.5 C) -- 87 16 90 % --  05/14/21 0433 -- -- -- -- -- -- 128.6 kg  05/14/21 0400 (!) 124/49 98.1 F (36.7 C) -- 88 20 92 % --  05/14/21 0300 (!) 118/50 98.1 F (36.7 C) -- 86 16 92 % --  05/14/21 0200 (!) 121/53 98.4 F (36.9 C) -- 88 19 93 % --  05/14/21 0100 (!) 116/47 98.6 F (37 C) -- 88 20 93 % --  05/14/21 0000 (!) 116/46 --  -- 94 13 95 % --  05/13/21 2340 -- -- -- -- -- 95 % --  05/13/21 2300 (!) 121/49 98.6 F (37 C) -- 88 16 93 % --  05/13/21 2200 (!) 112/47 98.4 F (36.9 C) -- 88 17 93 % --  05/13/21 2100 (!) 121/48 (!) 97.5 F (36.4 C) -- 88 20 94 % --  05/13/21 2021 -- -- -- -- -- 95 % --  05/13/21 2020 -- -- -- -- -- 94 % --  05/13/21 2000 (!) 120/45 97.7 F (36.5 C) Esophageal 83 15 93 % --  05/13/21 1900 (!) 108/52 (!) 97.5 F (36.4 C) Esophageal 84 18 91 % --  05/13/21 1845 (!) 118/46 (!) 97.5 F (36.4 C) Esophageal 85 20 90 % --  05/13/21 1834 -- 97.7 F (36.5 C) Esophageal 92 15 91 % --  05/13/21 1830 (!) 108/48 97.9 F (36.6 C) Esophageal 86 (!) 24 92 % --  05/13/21 1815 (!) 147/57 97.9 F (36.6 C) Esophageal 92 16 (!) 89 % --  05/13/21 1800 121/80 97.7 F (36.5 C) Esophageal 91 15 93 % --  05/13/21 1745 (!) 113/43 (!) 97.5 F (36.4 C) -- 84 17 92 % --  05/13/21 1730 (!) 119/45 (!) 97.3 F (36.3 C) Esophageal 85 20 93 % --  05/13/21 1715 (!) 114/47 (!) 97.3 F (36.3 C) Esophageal 79 19 92 % --  05/13/21 1700 129/60 (!) 96.8 F (36 C) Esophageal 79 (!) 23 92 % --  05/13/21 1645 (!) 110/43 (!) 97.2 F (36.2 C) Esophageal 87 (!) 23 94 % --  05/13/21 1630 (!) 115/46 (!) 97.2 F (36.2 C) Esophageal 77 (!) 24 98 % --  05/13/21 1615 (!) 117/45 (!) 97.2 F (36.2 C) Esophageal 75 (!) 21 100 % --  05/13/21 1600 (!) 125/47 (!) 97.2 F (36.2 C) Esophageal 81 (!) 22 100 % --  05/13/21 1545 (!) 134/55 (!) 97.3 F (36.3 C) Esophageal 88 (!) 21 93 % --  05/13/21 1532 -- (!) 97.3 F (36.3 C) Esophageal 80 (!) 23 95 % --  05/13/21 1530 (!) 113/52 (!) 97.3 F (36.3 C) Esophageal 80 20 94 % --  05/13/21 1515 (!) 117/50 (!) 97.3 F (36.3 C) Esophageal 82 (!) 24 94 % --  05/13/21 1500 (!) 125/49 (!) 97.5 F (36.4 C) Esophageal 82 (!) 25 94 % --  05/13/21 1430 (!) 122/47 97.9 F (36.6 C) Esophageal 93 13 92 % --  05/13/21 1415 (!) 158/64 98.2 F (36.8 C) Esophageal 93 (!) 26 94 % --  05/13/21  1403 -- 98.1 F (36.7 C) Esophageal 90 (!) 21 91 % --  05/13/21 1400 (!) 141/50 98.2 F (36.8 C) Esophageal 87 18 92 % --  05/13/21 1345 (!) 110/48 98.1 F (36.7 C) Esophageal 83 (!) 21 91 % --  05/13/21 1330 (!) 122/51 98.1 F (36.7 C) Esophageal 81 (!) 24 91 % --  05/13/21 1315 (!) 116/47 98.1 F (36.7 C) Esophageal 87 20 91 % --  05/13/21 1301 (!) 121/46 98.1 F (36.7 C) Esophageal 84 (!) 22 91 % --  05/13/21 1300 (!) 121/46 98.1 F (36.7 C) Esophageal 84 18 91 % --  05/13/21 1259 -- 98.1 F (36.7 C) Esophageal 88 (!) 21 (!) 89 % --  05/13/21 1258 -- 98.1 F (36.7 C) Esophageal 84 17 (!) 89 % --  05/13/21 1257 -- 98.1 F (36.7 C) Esophageal 83 (!) 25 (!) 89 % --  05/13/21 1256 -- 98.1 F (36.7 C) -- 86 (!) 23 (!) 89 % --  05/13/21 1255 (!) 126/49 98.1 F (36.7 C) -- 85 (!) 24 (!) 89 % --  05/13/21 1254 -- 98.1 F (36.7 C) -- 89 (!) 29 (!) 89 % --  05/13/21 1253 -- 98.1 F (36.7 C) -- 88 (!) 25 (!) 89 % --  05/13/21 1252 -- 98.2 F (36.8 C) -- 88 18 92 % --  05/13/21 1251 -- 98.1 F (36.7 C) -- 85 20 (!) 89 % --  05/13/21 1250 -- 97.9 F (36.6 C) Esophageal 86 17 90 % --  05/13/21 1249 -- 97.9 F (36.6 C) Esophageal 84 19 (!) 89 % --  05/13/21 1248 -- 97.9 F (36.6 C) Esophageal 88 20 (!) 89 % --  05/13/21 1247 -- 97.7 F (36.5 C) Esophageal 86 19 90 % --  05/13/21 1246 -- (!) 97.5 F (36.4 C) Esophageal 85 (!) 24 90 % --  05/13/21 1245 (!) 128/46 (!) 97.3 F (36.3 C) Esophageal 84 18 92 % --  05/13/21 1230 (!) 125/50 98.1 F (36.7 C) Esophageal 86 (!) 22 91 % --  05/13/21 1215 (!) 134/55 98.1 F (36.7 C) Esophageal 89 18 93 % --  05/13/21 1200 (!) 117/49 97.9 F (36.6 C) Esophageal 83 (!) 22 91 % --  05/13/21 1145 (!) 119/52 98.1 F (36.7 C) Esophageal 85 (!) 22 91 % --  05/13/21 1130 (!) 128/43 97.9 F (36.6 C) Esophageal 87 17 91 % --  05/13/21 1115 (!) 128/40 97.7 F (36.5 C) Esophageal 86 (!) 22 92 % --  05/13/21 1100 (!) 126/47 97.7 F (36.5 C)  Esophageal 90 (!) 24 91 % --  05/13/21 1045 (!) 125/49 (!) 96.8 F (36 C) Esophageal 86 (!) 23 92 % --  05/13/21 1030 (!) 126/49 97.7 F (36.5 C) Esophageal 88 16 93 % --  05/13/21 1015 (!) 118/46 (!) 97.5 F (36.4 C) Esophageal 81 20 92 % --  05/13/21 1000 (!) 117/44 (!) 97.5 F (36.4 C) Esophageal 83 20 92 % --  05/13/21 0945 (!) 127/52 (!) 97.5 F (36.4 C) Esophageal 87 14 93 % --   PHYSICAL EXAM:  General: intubated, on 1 pressor NG tube- getting tube feeds Neck:RT IJ central line  Left IJ HD cath Lungs: b/l air entry Heart: s1s2 , sinus   Abdomen: Soft, non-tender,not distended. Bowel sounds normal. No masses Foley  Extremities:left leg   wound vac Anasarca. Neurologic: cannot be assessed  Lab Results CBC Latest Ref Rng & Units 05/14/2021 05/13/2021 05/13/2021  WBC 4.0 - 10.5 K/uL 28.4(H) - 34.8(H)  Hemoglobin 12.0 - 15.0 g/dL 6.7(L) 7.6(L) 7.3(L)  Hematocrit 36.0 - 46.0 % 20.5(L) 22.4(L) 22.2(L)  Platelets 150 - 400 K/uL 121(L) - 141(L)    CMP Latest Ref Rng & Units 05/14/2021 05/13/2021 05/13/2021  Glucose 70 - 99 mg/dL 193(H) 146(H) 159(H)  BUN 8 - 23 mg/dL 33(H) 31(H) 36(H)  Creatinine 0.44 - 1.00 mg/dL 1.45(H) 1.56(H) 1.77(H)  Sodium 135 - 145 mmol/L 133(L) 133(L) 131(L)  Potassium 3.5 - 5.1 mmol/L 3.6 3.9 4.0  Chloride 98 - 111 mmol/L 98 97(L) 98  CO2 22 - 32 mmol/L 26 25 26   Calcium 8.9 - 10.3 mg/dL 9.1 8.8(L) 8.1(L)  Total Protein 6.5 - 8.1 g/dL - - 5.9(L)  Total Bilirubin 0.3 - 1.2 mg/dL - - 4.0(H)  Alkaline Phos 38 - 126 U/L - - 266(H)  AST 15 - 41 U/L - - 4,114(H)  ALT 0 - 44 U/L - - 1,400(H)     Microbiology: 05/04/21 BC Group A strep 4/4 1of 4 staph epidermidis- a contaminant 05/07/21- Wound culture NG 05/09/21 BC-NG  Assessment/Plan:  Streptococcus pyogenes bacteremia with septic shock, multiorgan failure  left leg necrotizing celluliits>fascitis S/p debridement  X2 - 05/07/21 and 05/10/21 - has wound vac  Pt is currently on penicillin and linezolid   - PCN will be changed to ceftriaxone to prevent fluid overload and third spacing Completed 3 doses of IVIG Leucocytosis -  trending down --could be from stress dose steroids and also reactive to Hb drop  Acute resp failure- pt remains intubated  CT head shows small volume SAH   AKI-  - hypovolemic shock and septic shock on CRRT since 12/28   Anemia- received  PRBC  Shock liver with  transaminitis and hyperbilirubinemia  Severe hypoalbuminemia Third spacing- getting Albumin'  DM on sliding scale  Afib was in sinus rhythm->> transient AFIB restarted amio>> sinus  Discussed the management with her nurse and intensivist

## 2021-05-14 NOTE — Progress Notes (Signed)
Patient ID: Abiha Lukehart, female   DOB: 1950/09/01, 71 y.o.   MRN: [id number]     [place] [facility] Day(s): 10.   Interval History: Patient seen and examined, no acute events overnight.  Patient continue critically ill, sedated on mechanical ventilation.  Continue on pressors.  Now just on epi and vasopressin.  As per nurse no issues with a negative pressure dressing.  Vital signs in last 24 hours: [min-max] current  Temp:  [96.8 F (36 C)-98.6 F (37 C)] 97.2 F (36.2 C) (01/31 0600) Pulse Rate:  [75-94] 85 (01/31 0600) Resp:  [11-29] 16 (01/31 0600) BP: (108-158)/(40-80) 127/50 (01/31 0600) SpO2:  [88 %-100 %] 92 % (01/31 0600) FiO2 (%):  [40 %-50 %] 50 % (01/31 0301) Weight:  [128.6 kg] 128.6 kg (01/31 0433)     Height: 5\' 5"  (165.1 cm) Weight: 128.6 kg BMI (Calculated): 47.18   Physical Exam:  Constitutional: Critically ill, sedated on mechanical ventilation Extremity: Left leg covered with negative pressure dressing.  Left foot without sign of ischemia.  Labs:  CBC Latest Ref Rng & Units 05/14/2021 05/13/2021 05/13/2021  WBC 4.0 - 10.5 K/uL 28.4(H) - 34.8(H)  Hemoglobin 12.0 - 15.0 g/dL 6.7(L) 7.6(L) 7.3(L)  Hematocrit 36.0 - 46.0 % 20.5(L) 22.4(L) 22.2(L)  Platelets 150 - 400 K/uL 121(L) - 141(L)   CMP Latest Ref Rng & Units 05/14/2021 05/13/2021 05/13/2021  Glucose 70 - 99 mg/dL 193(H) 146(H) 159(H)  BUN 8 - 23 mg/dL 33(H) 31(H) 36(H)  Creatinine 0.44 - 1.00 mg/dL 1.45(H) 1.56(H) 1.77(H)  Sodium 135 - 145 mmol/L 133(L) 133(L) 131(L)  Potassium 3.5 - 5.1 mmol/L 3.6 3.9 4.0  Chloride 98 - 111 mmol/L 98 97(L) 98  CO2 22 - 32 mmol/L 26 25 26   Calcium 8.9 - 10.3 mg/dL 9.1 8.8(L) 8.1(L)  Total Protein 6.5 - 8.1 g/dL - - 5.9(L)  Total Bilirubin 0.3 - 1.2 mg/dL - - 4.0(H)  Alkaline Phos 38 - 126 U/L - - 266(H)  AST 15 - 41 U/L - - 4,114(H)  ALT 0 - 44 U/L - - 1,400(H)    Imaging studies: No new pertinent imaging studies   Assessment/Plan:  71 y.o.  female with left leg necrotizing fasciitis with 5 Day Post-Op s/p debridement, complicated by pertinent comorbidities including strep pyogenes bacteremia now in septic shock, respiratory failure mechanical ventilation, liver failure, acute renal failure on CRRT, COPD, type 2 diabetes mellitus, hypertension, obstructive sleep apnea, GERD   Left leg necrotizing fasciitis -S/p debridement 05/07/2021 -Second debridement on 05/10/2021.  Application of wound VAC system -Negative pressure dressing in place.  No issues with since yesterday.  Next wound VAC change tomorrow. -There has been slow improvement.  Continue critical care by primary team and consultants. -I will continue to follow closely.  Arnold Long, MD

## 2021-05-14 NOTE — Progress Notes (Signed)
[facility]  ROUNDING NOTE   Subjective:  Critical illness persist at this time. Remains oligoanuric with urine output of only 5 cc over the preceding 24 hours. Continues on CRRT. Ventilatory support continues.   Objective:  Vital signs in last 24 hours:  Temp:  [96.8 F (36 C)-98.6 F (37 C)] 97.2 F (36.2 C) (01/31 0730) Pulse Rate:  [75-94] 87 (01/31 0730) Resp:  [11-29] 18 (01/31 0730) BP: (108-158)/(40-80) 133/52 (01/31 0730) SpO2:  [88 %-100 %] 94 % (01/31 0730) FiO2 (%):  [40 %-50 %] 50 % (01/31 0730) Weight:  [128.6 kg] 128.6 kg (01/31 0433)  Weight change: -2.1 kg Filed Weights   05/12/21 0500 05/13/21 0445 05/14/21 0433  Weight: 130.4 kg 130.7 kg 128.6 kg    Intake/Output: I/O last 3 completed shifts: In: 3910.7 [I.V.:1396.7; Other:426; NG/GT:784; IV FVCBSWHQP:5916] Out: [place].Neigh [Urine:9; Emesis/NG output:120; Drains:150; BWGYK:5993]   Intake/Output this shift:  No intake/output data recorded.  Physical Exam: General: Critically ill-appearing  Head: Normocephalic, atraumatic.  Endotracheal tube in place  Eyes: Anicteric  Neck: Supple  Lungs:  Scattered rhonchi bilateral, normal effort  Heart: S1S2 no rubs  Abdomen:  Soft, nontender, bowel sounds present  Extremities: Left lower extremity wound VAC in place.  2+ right lower extremity edema  Neurologic: Intubated, sedated  Skin: No acute rash  Access: Left IJ temporary dialysis catheter    Basic Metabolic Panel: Recent Labs  Lab 05/11/21 0531 05/11/21 1600 05/11/21 2052 05/12/21 0431 05/12/21 1025 05/12/21 1603 05/13/21 0311 05/13/21 1650 05/14/21 0410  NA 137   < > 136   < > 132* 132* 131* 133* 133*  K 5.4*   < > 4.6   < > 4.4 4.4 4.0 3.9 3.6  CL 98   < > 95*   < > 94* 94* 98 97* 98  CO2 15*   < > 25   < > '27 26 26 25 26  ' GLUCOSE 368*   < > 123*   < > 155* 162* 159* 146* 193*  BUN 92*   < > 72*   < > 50* 44* 36* 31* 33*  CREATININE 4.21*   < > 3.28*   < > 2.29* 2.04* 1.77* 1.56*  1.45*  CALCIUM 6.8*   < > 6.7*   < > 7.6* 7.8* 8.1* 8.8* 9.1  MG 2.9*  --  2.2  --  2.1  --  1.8  --  1.9  PHOS 14.2*   < > 8.4*   < > 5.8* 4.9* 3.4 2.8 2.3*   < > = values in this interval not displayed.     Liver Function Tests: Recent Labs  Lab 05/10/21 0450 05/11/21 0531 05/11/21 1600 05/12/21 0431 05/12/21 1025 05/12/21 1603 05/13/21 0311 05/13/21 1650 05/14/21 0410  AST 97* 4,359*  --  6,519* 6,250*  --  4,114*  --   --   ALT 36 1,746*  --  1,613* 1,711*  --  1,400*  --   --   ALKPHOS 145* 185*  --  232* 262*  --  266*  --   --   BILITOT 1.8* 2.5*  --  3.0* 3.9*  --  4.0*  --   --   PROT 5.0* 4.6*  --  5.3* 6.3*  --  5.9*  --   --   ALBUMIN <1.5* <1.5*   < > 2.1*   2.0* 2.5* 2.7* 2.4*   2.4* 2.6* 3.0*   < > = values in this  interval not displayed.    No results for input(s): LIPASE, AMYLASE in the last 168 hours. No results for input(s): AMMONIA in the last 168 hours.  CBC: Recent Labs  Lab 05/11/21 0040 05/11/21 0531 05/11/21 1239 05/11/21 2052 05/12/21 1025 05/13/21 0311 05/13/21 1650 05/14/21 0410  WBC 42.7* 42.8*  --   --  40.8* 34.8*  --  28.4*  NEUTROABS  --  34.4*  --   --   --   --   --   --   HGB 9.7* 10.8*   < > 9.9* 8.2* 7.3* 7.6* 6.7*  HCT 32.2* 33.2*   < > 30.1* 24.5* 22.2* 22.4* 20.5*  MCV 91.5 88.3  --   --  85.7 87.7  --  86.5  PLT 208 219  --   --  187 141*  --  121*   < > = values in this interval not displayed.     Cardiac Enzymes: Recent Labs  Lab 05/08/21 1313 05/12/21 1603  CKTOTAL 1,575* 4,670*     BNP: Invalid input(s): POCBNP  CBG: Recent Labs  Lab 05/13/21 1648 05/13/21 1957 05/13/21 2322 05/14/21 0330 05/14/21 0708  GLUCAP 146* 152* 172* 191* 177*     Microbiology: Results for orders placed or performed during the hospital encounter of 05/04/21  Blood Culture (routine x 2)     Status: Abnormal   Collection Time: 05/04/21  7:43 PM   Specimen: BLOOD  Result Value Ref Range Status   Specimen Description    Final    BLOOD RIGHT ANTECUBITAL Performed at [facility], [place] [address], [place], [place] [address]    Special Requests   Final    BOTTLES DRAWN AEROBIC AND ANAEROBIC Blood Culture adequate volume Performed at [facility], [address] [place], [place] [address]    Culture  Setup Time   Final    GRAM POSITIVE COCCI IN BOTH AEROBIC AND ANAEROBIC BOTTLES RESULT CALLED TO, READ BACK BY AND VERIFIED WITH: Nilsa Nutting 05/05/21 @ 1007 BY SB    Culture (A)  Final    GROUP A STREP (S.PYOGENES) ISOLATED HEALTH DEPARTMENT NOTIFIED Performed at [facility], [place] [address] [place], [place] [address]    Report Status 05/07/2021 FINAL  Final   Organism ID, Bacteria GROUP A STREP (S.PYOGENES) ISOLATED  Final      Susceptibility   Group a strep (s.pyogenes) isolated - MIC*    PENICILLIN <=0.06 SENSITIVE Sensitive     CEFTRIAXONE <=0.12 SENSITIVE Sensitive     ERYTHROMYCIN <=0.12 SENSITIVE Sensitive     LEVOFLOXACIN 1 SENSITIVE Sensitive     VANCOMYCIN 0.5 SENSITIVE Sensitive     * GROUP A STREP (S.PYOGENES) ISOLATED  Blood Culture ID Panel (Reflexed)     Status: Abnormal   Collection Time: 05/04/21  7:43 PM  Result Value Ref Range Status   Enterococcus faecalis NOT DETECTED NOT DETECTED Final   Enterococcus Faecium NOT DETECTED NOT DETECTED Final   Listeria monocytogenes NOT DETECTED NOT DETECTED Final   Staphylococcus species NOT DETECTED NOT DETECTED Final   Staphylococcus aureus (BCID) NOT DETECTED NOT DETECTED Final   Staphylococcus epidermidis NOT DETECTED NOT DETECTED Final   Staphylococcus lugdunensis NOT DETECTED NOT DETECTED Final   Streptococcus species DETECTED (A) NOT DETECTED Final    Comment: RESULT CALLED TO, READ BACK BY AND VERIFIED WITH: Nilsa Nutting 05/05/21 @ 1007 BY SB    Streptococcus agalactiae NOT DETECTED NOT DETECTED Final   Streptococcus pneumoniae  NOT DETECTED NOT DETECTED Final   Streptococcus pyogenes DETECTED (A) NOT DETECTED  Final    Comment: RESULT CALLED TO, READ BACK BY AND VERIFIED WITH: Nilsa Nutting 05/05/21 @ 1007 BY SB    A.calcoaceticus-baumannii NOT DETECTED NOT DETECTED Final   Bacteroides fragilis NOT DETECTED NOT DETECTED Final   Enterobacterales NOT DETECTED NOT DETECTED Final   Enterobacter cloacae complex NOT DETECTED NOT DETECTED Final   Escherichia coli NOT DETECTED NOT DETECTED Final   Klebsiella aerogenes NOT DETECTED NOT DETECTED Final   Klebsiella oxytoca NOT DETECTED NOT DETECTED Final   Klebsiella pneumoniae NOT DETECTED NOT DETECTED Final   Proteus species NOT DETECTED NOT DETECTED Final   Salmonella species NOT DETECTED NOT DETECTED Final   Serratia marcescens NOT DETECTED NOT DETECTED Final   Haemophilus influenzae NOT DETECTED NOT DETECTED Final   Neisseria meningitidis NOT DETECTED NOT DETECTED Final   Pseudomonas aeruginosa NOT DETECTED NOT DETECTED Final   Stenotrophomonas maltophilia NOT DETECTED NOT DETECTED Final   Candida albicans NOT DETECTED NOT DETECTED Final   Candida auris NOT DETECTED NOT DETECTED Final   Candida glabrata NOT DETECTED NOT DETECTED Final   Candida krusei NOT DETECTED NOT DETECTED Final   Candida parapsilosis NOT DETECTED NOT DETECTED Final   Candida tropicalis NOT DETECTED NOT DETECTED Final   Cryptococcus neoformans/gattii NOT DETECTED NOT DETECTED Final    Comment: Performed at [facility], [address] [place], [place] [address]  Blood Culture (routine x 2)     Status: Abnormal   Collection Time: 05/04/21  9:29 PM   Specimen: BLOOD  Result Value Ref Range Status   Specimen Description   Final    BLOOD RIGHT ANTECUBITAL Performed at [facility], [address] [place], [place] [address]    Special Requests   Final    BOTTLES DRAWN AEROBIC AND ANAEROBIC Blood Culture adequate volume Performed at [facility], [address] [place], [place] [address]    Culture  Setup Time   Final    GRAM POSITIVE COCCI  IN BOTH AEROBIC AND ANAEROBIC BOTTLES CRITICAL VALUE NOTED.  VALUE IS CONSISTENT WITH PREVIOUSLY REPORTED AND CALLED VALUE. Performed at [facility], [address], [place], [place] [address]    Culture (A)  Final    GROUP A STREP (S.PYOGENES) ISOLATED HEALTH DEPARTMENT NOTIFIED SUSCEPTIBILITIES PERFORMED ON PREVIOUS CULTURE WITHIN THE LAST 5 DAYS. STAPHYLOCOCCUS EPIDERMIDIS THE SIGNIFICANCE OF ISOLATING THIS ORGANISM FROM A SINGLE SET OF BLOOD CULTURES WHEN MULTIPLE SETS ARE DRAWN IS UNCERTAIN. PLEASE NOTIFY THE MICROBIOLOGY DEPARTMENT WITHIN ONE WEEK IF SPECIATION AND SENSITIVITIES ARE REQUIRED. Performed at [facility], [place] [address] [place], [place] [address]    Report Status 05/08/2021 FINAL  Final  Resp Panel by RT-PCR (Flu A&B, Covid) Nasopharyngeal Swab     Status: None   Collection Time: 05/04/21  9:50 PM   Specimen: Nasopharyngeal Swab; Nasopharyngeal(NP) swabs in vial transport medium  Result Value Ref Range Status   SARS Coronavirus 2 by RT PCR NEGATIVE NEGATIVE Final    Comment: (NOTE) SARS-CoV-2 target nucleic acids are NOT DETECTED.  The SARS-CoV-2 RNA is generally detectable in upper respiratory specimens during the acute phase of infection. The lowest concentration of SARS-CoV-2 viral copies this assay can detect is 138 copies/mL. A negative result does not preclude SARS-Cov-2 infection and should not be used as the sole basis for treatment or other patient management decisions. A negative result may occur with  improper specimen collection/handling, submission  of specimen other than nasopharyngeal swab, presence of viral mutation(s) within the areas targeted by this assay, and inadequate number of viral copies(<138 copies/mL). A negative result must be combined with clinical observations, patient history, and epidemiological information. The expected result is Negative.  Fact Sheet for Patients:   EntrepreneurPulse.com.au  Fact Sheet for Healthcare Providers:  IncredibleEmployment.be  This test is no t yet approved or cleared by the [place] FDA and  has been authorized for detection and/or diagnosis of SARS-CoV-2 by FDA under an Emergency Use Authorization (EUA). This EUA will remain  in effect (meaning this test can be used) for the duration of the COVID-19 declaration under Section 564(b)(1) of the Act, 21 U.S.C.section 360bbb-3(b)(1), unless the authorization is terminated  or revoked sooner.       Influenza A by PCR NEGATIVE NEGATIVE Final   Influenza B by PCR NEGATIVE NEGATIVE Final    Comment: (NOTE) The Xpert Xpress SARS-CoV-2/FLU/RSV plus assay is intended as an aid in the diagnosis of influenza from Nasopharyngeal swab specimens and should not be used as a sole basis for treatment. Nasal washings and aspirates are unacceptable for Xpert Xpress SARS-CoV-2/FLU/RSV testing.  Fact Sheet for Patients: EntrepreneurPulse.com.au  Fact Sheet for Healthcare Providers: IncredibleEmployment.be  This test is not yet approved or cleared by the [place] FDA and has been authorized for detection and/or diagnosis of SARS-CoV-2 by FDA under an Emergency Use Authorization (EUA). This EUA will remain in effect (meaning this test can be used) for the duration of the COVID-19 declaration under Section 564(b)(1) of the Act, 21 U.S.C. section 360bbb-3(b)(1), unless the authorization is terminated or revoked.  Performed at [facility], [address] [place], [place] [address]   MRSA Next Gen by PCR, Nasal     Status: None   Collection Time: 05/05/21 11:02 AM   Specimen: Nasal Mucosa; Nasal Swab  Result Value Ref Range Status   MRSA by PCR Next Gen NOT DETECTED NOT DETECTED Final    Comment: (NOTE) The GeneXpert MRSA Assay (FDA approved for NASAL specimens only), is one component of a  comprehensive MRSA colonization surveillance program. It is not intended to diagnose MRSA infection nor to guide or monitor treatment for MRSA infections. Test performance is not FDA approved in patients less than 43 years old. Performed at [facility], [address] [place], [place] [address]   Urine Culture     Status: None   Collection Time: 05/07/21  2:30 PM   Specimen: Urine, Random  Result Value Ref Range Status   Specimen Description   Final    URINE, RANDOM Performed at [facility], [address] [place], [place] [address]    Special Requests   Final    NONE Performed at [facility], [address], [place], [place] [address]    Culture   Final    NO GROWTH Performed at [facility], [place] [address], [place], [place] [address]    Report Status 05/09/2021 FINAL  Final  Aerobic/Anaerobic Culture w Gram Stain (surgical/deep wound)     Status: None   Collection Time: 05/07/21  9:35 PM   Specimen: PATH Other; Tissue  Result Value Ref Range Status   Specimen Description   Final    LEG LEFT Performed at [facility], [address] [place], [place] [address]    Special Requests PT PREVIOUSLY ON CLINDAMYCIN,PENICILLIN  Final   Gram Stain   Final    RARE WBC  PRESENT,BOTH PMN AND MONONUCLEAR NO ORGANISMS SEEN    Culture   Final    No growth aerobically or anaerobically. Performed at [facility], [place] [address], [place], [place] [address]    Report Status 05/13/2021 FINAL  Final  CULTURE, BLOOD (ROUTINE X 2) w Reflex to ID Panel     Status: None   Collection Time: 05/09/21  7:13 AM   Specimen: BLOOD  Result Value Ref Range Status   Specimen Description BLOOD BLOOD LEFT HAND  Final   Special Requests   Final    BOTTLES DRAWN AEROBIC AND ANAEROBIC Blood Culture adequate volume   Culture   Final    NO GROWTH 5 DAYS Performed at [facility], [address] [place], [place] [address]    Report Status  05/14/2021 FINAL  Final  CULTURE, BLOOD (ROUTINE X 2) w Reflex to ID Panel     Status: None   Collection Time: 05/09/21  7:23 AM   Specimen: BLOOD  Result Value Ref Range Status   Specimen Description BLOOD LEFT THUMB  Final   Special Requests   Final    BOTTLES DRAWN AEROBIC AND ANAEROBIC Blood Culture results may not be optimal due to an inadequate volume of blood received in culture bottles   Culture   Final    NO GROWTH 5 DAYS Performed at [facility], [address] [place], [place] [address]    Report Status 05/14/2021 FINAL  Final    Coagulation Studies: No results for input(s): LABPROT, INR in the last 72 hours.  Urinalysis: No results for input(s): COLORURINE, LABSPEC, PHURINE, GLUCOSEU, HGBUR, BILIRUBINUR, KETONESUR, PROTEINUR, UROBILINOGEN, NITRITE, LEUKOCYTESUR in the last 72 hours.  Invalid input(s): APPERANCEUR    Imaging: EEG adult  Result Date: 05/13/2021 Derek Jack, MD     05/13/2021  8:16 PM Routine EEG Report Alexandra Foster is a 71 y.o. female with a history of septic shock and encephalopathy who is undergoing an EEG to evaluate for seizures. Report: This EEG was acquired with electrodes placed according to the International 10-20 electrode system (including Fp1, Fp2, F3, F4, C3, C4, P3, P4, O1, O2, T3, T4, T5, T6, A1, A2, Fz, Cz, Pz). The following electrodes were missing or displaced: none. The best background was continuous at 3-4 Hz. This activity is reactive to stimulation. No sleep architecture was identified. There were frequent triphasic waves that did not appear epileptiform. There was no focal slowing. There were no interictal epileptiform discharges. There were no electrographic seizures identified. Photic stimulation and hyperventilation were not performed. Impression and clinical correlation: This EEG was obtained while sedated on hydromorphone and is abnormal due to: - Severe diffuse slowing indicative of global cerebral dysfunction -  Frequent triphasic waves indicative of metabolic encephalopathy Epileptiform abnormalities were not seen during this recording. Su Monks, MD [facility] [phone] If 7pm- 7am, please page neurology on call as listed in [place].     Medications:    sodium chloride Stopped (05/13/21 2154)   epinephrine 2.5 mcg/min (05/14/21 0700)   HYDROmorphone 0.5 mg/hr (05/14/21 0700)   levETIRAcetam 1,000 mg (05/13/21 2204)   linezolid (ZYVOX) IV 600 mg (05/13/21 2151)   norepinephrine (LEVOPHED) Adult infusion Stopped (05/12/21 1816)   pencillin G potassium IV Stopped (05/14/21 0542)   potassium PHOSPHATE IVPB (in mmol)     prismasol BGK 2/2.5 dialysis solution 1,500 mL/hr at 05/13/21 1810   prismasol BGK 2/2.5 replacement solution 500 mL/hr at 05/13/21 1800   prismasol  BGK 2/2.5 replacement solution 500 mL/hr at 05/13/21 1805   vasopressin 0.02 Units/min (05/14/21 0700)    sodium chloride   Intravenous Once   vitamin C  500 mg Oral BID   budesonide (PULMICORT) nebulizer solution  0.25 mg Nebulization BID   chlorhexidine gluconate (MEDLINE KIT)  15 mL Mouth Rinse BID   Chlorhexidine Gluconate Cloth  6 each Topical Q0600   docusate  100 mg Per Tube BID   feeding supplement (PIVOT 1.5 CAL)  1,000 mL Per Tube Q24H   free water  30 mL Per Tube Q4H   heparin injection (subcutaneous)  5,000 Units Subcutaneous Q8H   hydrocortisone sod succinate (SOLU-CORTEF) inj  100 mg Intravenous Q8H   insulin aspart  1-3 Units Subcutaneous Q4H   insulin detemir  38 Units Subcutaneous BID   ipratropium-albuterol  3 mL Nebulization Q6H   mouth rinse  15 mL Mouth Rinse 10 times per day   multivitamin with minerals  1 tablet Per Tube Daily   pantoprazole (PROTONIX) IV  40 mg Intravenous Q24H   polyethylene glycol  17 g Per Tube Daily   sodium chloride flush  10-40 mL Intracatheter Q12H   zinc sulfate  220 mg Per Tube Daily   docusate sodium, heparin, HYDROmorphone, lip balm, midazolam, ondansetron  (ZOFRAN) IV, oxyCODONE, sennosides, sodium chloride, sodium chloride flush  Assessment/ Plan:  71 y.o. female with past medical history of diabetes mellitus type 2, hypertension, obstructive sleep apnea on CPAP, GERD, history of COVID-19 pneumonia who came in with left lower extremity pain and chills and subsequently found to have streptococcal pyogenes bacteremia and cellulitis status post surgical debridement with subsequent wound VAC placement.  1.  Acute kidney injury secondary to ATN from septic shock. 01/30 0701 - 01/31 0700 In: 2631.4 [I.V.:1053.4; JS/UN:991; IV Piggyback:854] Out: 4445 [Urine:5; Emesis/NG output:90; Drains:100] Lab Results  Component Value Date   CREATININE 1.45 (H) 05/14/2021   CREATININE 1.56 (H) 05/13/2021   CREATININE 1.77 (H) 05/13/2021  Patient remains oligoanuric.  Urine output only 5 cc over the preceding 24 hours.  Continue CRRT.  Maintain ultrafiltration target of 100 cc/h.  2.  Acute respiratory failure.  Weaning as per pulmonary/critical care.  Continue ventilatory support.  3.  Hypotension.  Continue epinephrine and vasopressin to maintain MAP of 65 or greater.  4.  Left lower extremity deep infection status post surgical debridement and wound VAC placement/Streptococcus pyogenes infection.  Patient on linezolid and penicillin at this time.  5.  Hyponatremia.  Serum sodium up a bit to 133 today.  Continue to monitor.    LOS: 10 Kamali Sakata 1/31/20237:51 AM

## 2021-05-14 NOTE — Consult Note (Addendum)
PHARMACY CONSULT NOTE - FOLLOW UP  Pharmacy Consult for Electrolyte Monitoring and Replacement   Recent Labs: Potassium (mmol/L)  Date Value  05/14/2021 3.6  08/11/2013 3.3 (L)   Magnesium (mg/dL)  Date Value  05/14/2021 1.9   Calcium (mg/dL)  Date Value  05/14/2021 9.1   Calcium, Total (mg/dL)  Date Value  08/11/2013 9.2   Albumin (g/dL)  Date Value  05/14/2021 3.0 (L)  08/11/2013 3.1 (L)   Phosphorus (mg/dL)  Date Value  05/14/2021 2.3 (L)   Sodium (mmol/L)  Date Value  05/14/2021 133 (L)  08/11/2013 138   Assessment: 71 yo female with a previous medical history of diabetes mellitus type 2, hypertension, obstructive sleep apnea on CPAP, asthma/COPD, GERD, history of COVID-19 pneumonia presenting with lower left extremity pain and chills. Patient found to have severe sepsis and strep pyogenes bacteremia on admission and is currently intubated and sedated. Pharmacy has been consulted to monitor and replace electrolytes.  Nutrition:  --Feeds per tube --Free water per tube 30 mL Q4H  Goal of Therapy:  Electrolytes within normal limits  Plan:  --Mild hyponatremia Na 133, nephrology following --Phosphorous 2.3, will give 20 mmol K-Phos (~30 mEq K)  --Follow-up with evening labs  Owens Loffler 05/14/2021 7:35 AM

## 2021-05-14 NOTE — Progress Notes (Signed)
Brief Progress Note  CT Head revealed a small volume [facility] notified Neurology and Neurosurgery consulted.  Case discussed with Dr. Maurine Minister, [place] Pager [phone] (please enter 7 digits) PCCM Consult Pager [phone] (please enter 7 digits)

## 2021-05-14 NOTE — Progress Notes (Addendum)
Nutrition Follow-up  DOCUMENTATION CODES:   Morbid obesity  INTERVENTION:   -Continue renal MVI daily via tube -Continue 500 mg vitamin C BID via tube -Continue 220 mg zinc daily via tube -D/c Pivot 1.5  -Initiate Vital AF 1.2 @ 20 ml/hr via OGT and increase by 10 ml every 4 hours to goal rate of 50 ml/hr.   90 ml Prosource TF BID.    30 ml free water flush every 6 hours to maintain tube patency  Tube feeding regimen provides 1600 kcal (100% of needs), 134 grams of protein, and 973 ml of H2O. Total free water: 1153 ml daily  -1 packet Juven BID, each packet provides 95 calories, 2.5 grams of protein (collagen), and 9.8 grams of carbohydrate (3 grams sugar); also contains 7 grams of L-arginine and L-glutamine, 300 mg vitamin C, 15 mg vitamin E, 1.2 mcg vitamin B-12, 9.5 mg zinc, 200 mg calcium, and 1.5 g  Calcium Beta-hydroxy-Beta-methylbutyrate to support wound healing   NUTRITION DIAGNOSIS:   Increased nutrient needs related to wound healing as evidenced by estimated needs.  Ongoing  GOAL:   Patient will meet greater than or equal to 90% of their needs  Progressing   MONITOR:   PO intake, Supplement acceptance, Labs, Weight trends, Skin, I & O's  REASON FOR ASSESSMENT:   Rounds    ASSESSMENT:   71 y.o. female with a history of asthma/COPD, HTN, DMII, obesity, OSA on CPAP, GERD and COVID-19 pneumonia who was admitted 1/22 w/ left LE cellulitis, septic shock, S pyogenes bacteremia, hypotension, new Afib and AKI.  1/24- s/p I&D of lt leg 1/27- s/p PROCEDURE(S):  1.) Sharp excisional debridement of left leg 2.) Veraflow (negative pressure dressing (DME) placement); NGT placed for enteral nutrition; intubated 1/28- CRRT initiated 1/30- IVIG started due to streptococcal toxic shock syndrome  Patient is currently intubated on ventilator support MV: 8.4 L/min Temp (24hrs), Avg:97.7 F (36.5 C), Min:96.8 F (36 C), Max:98.6 F (37 C)  Reviewed I/O's: -2.5 L x 24  hours and +13.1 L since admission  UOP: 5 ml x 24 hours  NGT output: 90 ml x 24 hours  Drain output: 100 ml x 24 hours  Case discussed with RN, MD, and during ICU rounds.    Per RN, surgery changed wound vac yesterday. Pt not following commands. She developed a-fib with RVR and was started on amiodarone. She is able so bleeding from the mouth; she is currently receiving blood transfusion. Plan to wean off vasopressin.   Pt remains on CRRT and pulling off 100 ml/hr. Pt is +14 L.   Pt remains on trickle feeds (Pivot 1.5 @ 20 ml/hr) and tolerating well. RD received permission to advance TF today. Pivot d/c'd and replaced with Vital AF 1.2, as Pivot 1.5 is currently out of stock.   Medications reviewed and include vitamin C, colace, solu-cortef, keppra, zyvox, miralax, zinc sulfate, amiodarone, dilaudid, magnesium sulfate, and pitressin.   Labs reviewed: Na: 133, Phos: 2.3 (on IV supplementation), CBGS: 152-191 (inpatient orders for glycemic control are 1-3 units insulin aspart every 4 hours and 38 units insulin detemir BID).    Diet Order:   Diet Order             Diet NPO time specified  Diet effective midnight                   EDUCATION NEEDS:   Education needs have been addressed  Skin:  Skin Assessment: Skin Integrity Issues: Skin Integrity  Issues:: Wound VAC Wound Vac: closed lt leg  Last BM:  05/10/21  Height:   Ht Readings from Last 1 Encounters:  05/04/21 5\' 5"  (1.651 m)    Weight:   Wt Readings from Last 1 Encounters:  05/14/21 128.6 kg    Ideal Body Weight:  56.8 kg  BMI:  Body mass index is 47.18 kg/m.  Estimated Nutritional Needs:   Kcal:  1250-1420  Protein:  130-145 grams  Fluid:  > 1.2 L    Loistine Chance, RD, LDN, [place] Registered Dietitian II Certified Diabetes Care and Education Specialist Please refer to [facility] for RD and/or RD on-call/weekend/after hours pager

## 2021-05-14 NOTE — Progress Notes (Signed)
NAME:  Alexandra Foster, MRN:  [id number], DOB:  02-25-1951, LOS: 35 ADMISSION DATE:  05/04/2021  BRIEF SYNOPSIS 71 y.o female with medical history significant of Asthma/COPD, T2DM, HTN, OSA on CPAP, GERD, COVID-19 pneumonia, and Bilateral lower extremity edema who presented to the ED with chief complaint of LLE pain and chills since Thursday.  Patient was given 30 cc/kg of fluids and started on broad-spectrum antibiotics for sepsis with septic shock.   Admitted to the ICU with severe sepsis with shock secondary to cellulitis of LLE septic shock with LLL necrotizing fascitis post op resp failure and severe septic shock with progressive renal failure on CRRT and progressive multiorgan failure   Past Medical History     Arthritis   Asthma   COPD (chronic obstructive pulmonary disease) ([place])   Diabetes mellitus without complication ([place])   Endometriosis   GERD (gastroesophageal reflux disease)   Hypertension   Obesity   Sleep apnea    CPAP    Significant Hospital Events   05/04/21: Admitted to the ICU with severe sepsis with shock secondary to cellulitis of LLE 1/23 off pressors +Afib with RVR 1/24 blood pressure low  1/24 to OR for fasciotomy 1/25 post op resp failure with severe septic shock 1/26 remains on pressors 1/27 returns to the OR today for debridement, wound VAC 1/28 overnight with hemodynamic deterioration, required reintubation, currently on pressors 1/29 remains on 3 pressors, CRRT, wound VAC and minimal sedation.  Awakens to voice and touch.  Seen by vascular surgery and awaiting input from general surgery regarding amputation of left leg 1/30 remains on vent remains on pressors 1/31 remains on pressors, on vent, on CRRT   Significant Diagnostic Tests:  1/21: Chest Xray> no active cardiopulmonary process 1/21: CT left lower leg>Diffuse subcutaneous edema may represent cellulitis. No drainable fluid collection/abscess 1/21: Ultrasound lower unilateral left> no  DVT   Micro Data:  1/21: SARS-CoV-2 PCR> negative 1/21: Influenza PCR> negative 1/21: Blood culture x2> Streptococcus pyogenes 1/21: Urine Culture> negative 1/21: MRSA PCR>> negative   Antimicrobials:  Vancomycin 1/21> off Cefepime 1/21> off Ceftriaxone 1/21 >> off Penicilli G 1/24>> Linezolid 1/25>>  *Antimicrobials per ID    Interim History / Subjective:  Remains critically ill Patient with acute necrotizing fasciitis with emergent fasciotomy remains on pressors Wound vac on left leg Returned to the OR 1/27 progressive hemodynamic instability after OR Remains on CCRT Fio2 increased to 50%   Objective   Blood pressure (!) 133/52, pulse 87, temperature (!) 97.2 F (36.2 C), temperature source Esophageal, resp. rate 18, height 5' 5" (1.651 m), weight 128.6 kg, SpO2 94 %.    Vent Mode: PRVC FiO2 (%):  [40 %-50 %] 50 % Set Rate:  [20 bmp] 20 bmp Vt Set:  [460 mL] 460 mL PEEP:  [5 cmH20] 5 cmH20 Pressure Support:  [5 cmH20] 5 cmH20 Plateau Pressure:  [17 cmH20] 17 cmH20   Intake/Output Summary (Last 24 hours) at 05/14/2021 0741 Last data filed at 05/14/2021 0700 Gross per 24 hour  Intake 2631.37 ml  Output 5128 ml  Net -2496.63 ml    Filed Weights   05/12/21 0500 05/13/21 0445 05/14/21 0433  Weight: 130.4 kg 130.7 kg 128.6 kg    REVIEW OF SYSTEMS  PATIENT IS UNABLE TO PROVIDE COMPLETE REVIEW OF SYSTEMS DUE TO SEVERE CRITICAL ILLNESS AND TOXIC METABOLIC ENCEPHALOPATHY    PHYSICAL EXAMINATION:  GENERAL:critically ill appearing, +resp distress EYES: Pupils equal, round, reactive to light.  No scleral icterus.  MOUTH: Moist mucosal membrane. INTUBATED NECK: Supple.  PULMONARY: +rhonchi, +wheezing CARDIOVASCULAR: S1 and S2.  No murmurs  GASTROINTESTINAL: Soft, nontender, -distended. Positive bowel sounds.  NEUROLOGIC: obtunded MUSCULOSKELETAL: Left lower extremity with wound VAC in place, surgery redressing.  Status post fasciotomy   Assessment & Plan    Admitted to the ICU with severe sepsis with shock secondary to cellulitis of LLE septic shock with LLL necrotizing fascitis post op resp failure and severe septic shock with progressive renal failure on CRRT and progressive multiorgan failure  Severe ACUTE Hypoxic and Hypercapnic Respiratory Failure -continue Mechanical Ventilator support -Wean Fio2 and PEEP as tolerated -VAP/VENT bundle implementation - Wean PEEP & FiO2 as tolerated, maintain SpO2 > 88% - Head of bed elevated 30 degrees, VAP protocol in place - Plateau pressures less than 30 cm H20  - Intermittent chest x-ray & ABG PRN - Ensure adequate pulmonary hygiene  -will NOT perform SAT/SBT when respiratory parameters are met  SEPTIC shock SOURCE-septic shock  -use vasopressors to keep MAP>65 as needed -follow ABG and LA as needed -follow up cultures -emperic ABX -consider stress dose steroids -aggressive IV fluid Resuscitation   Acute Hypoxic Respiratory Failure in the setting of worsening septic shock PMHx: COPD Refractory Septic Shock secondary to LLE necrotizing fasciitis Streptococcal toxic shock syndrome   ACUTE KIDNEY INJURY/Renal Failure -continue Foley Catheter-assess need -Avoid nephrotoxic agents -Follow urine output, BMP -Ensure adequate renal perfusion, optimize oxygenation -Renal dose medications Remains On CRRT   Intake/Output Summary (Last 24 hours) at 05/14/2021 0746 Last data filed at 05/14/2021 0700 Gross per 24 hour  Intake 2631.37 ml  Output 5128 ml  Net -2496.63 ml    SEPTIC shock SOURCE-LLE Refractory Septic Shock secondary to LLE necrotizing fasciitis Streptococcal toxic shock syndrome -use vasopressors to keep MAP>65 as needed  stress dose steroids S/p IVIG   Group A strep necrotizing fasciitis L LE Streptococcal toxic shock - Continue antibiotics: Penicillin G/linezolid - Status postdebridement, wound VAC per surgery - Poor prognosis -Vascular and general surgery  following S/p IVIG   ACUTE ANEMIA- TRANSFUSE AS NEEDED CONSIDER TRANSFUSION  IF HGB<7 DVT PRX with TED/SCD's ONLY   ENDO - ICU hypoglycemic\Hyperglycemia protocol -check FSBS per protocol   GI GI PROPHYLAXIS as indicated  NUTRITIONAL STATUS DIET-->TF's as tolerated Constipation protocol as indicated   ELECTROLYTES -follow labs as needed -replace as needed -pharmacy consultation and following    Acute encephalopathy with questionable myoclonic seizure activity last night 05/11/21 DDx: Acute metabolic, query HIE given shock/massive resuscitation last PM -Toxic metabolic encephalopathy - Concern for toxemic/ischemic encephalopathy due to prolonged resuscitation - Loaded with keppra; continue at 1gram bid Consider MRI brain -Prn versed  -Neurology consulted    Best practice:  Diet: NPO Pain/Anxiety/Delirium protocol (if indicated): Yes, initiated VAP protocol (if indicated): Yes, initiated DVT prophylaxis: Subcutaneous Heparin GI prophylaxis: PPI Glucose control: ICU hyperglycemia protocol, insulin drip discontinued; SQ insulin Central venous access:  Rt IJV Arterial line:  N/A Foley:  Yes Mobility:  bed rest  PT consulted: N/A Last date of multidisciplinary goals of care discussion [1/27] Code Status:  full code Disposition: ICU    Labs   CBC: Recent Labs  Lab 05/11/21 0040 05/11/21 0531 05/11/21 1239 05/11/21 2052 05/12/21 1025 05/13/21 0311 05/13/21 1650 05/14/21 0410  WBC 42.7* 42.8*  --   --  40.8* 34.8*  --  28.4*  NEUTROABS  --  34.4*  --   --   --   --   --   --  HGB 9.7* 10.8*   < > 9.9* 8.2* 7.3* 7.6* 6.7*  HCT 32.2* 33.2*   < > 30.1* 24.5* 22.2* 22.4* 20.5*  MCV 91.5 88.3  --   --  85.7 87.7  --  86.5  PLT 208 219  --   --  187 141*  --  121*   < > = values in this interval not displayed.     Basic Metabolic Panel: Recent Labs  Lab 05/11/21 0531 05/11/21 1600 05/11/21 2052 05/12/21 0431 05/12/21 1025 05/12/21 1603  05/13/21 0311 05/13/21 1650 05/14/21 0410  NA 137   < > 136   < > 132* 132* 131* 133* 133*  K 5.4*   < > 4.6   < > 4.4 4.4 4.0 3.9 3.6  CL 98   < > 95*   < > 94* 94* 98 97* 98  CO2 15*   < > 25   < > _0 GLUCOSE 368*   < > 123*   < > 155* 162* 159* 146* 193*  BUN 92*   < > 72*   < > 50* 44* 36* 31* 33*  CREATININE 4.21*   < > 3.28*   < > 2.29* 2.04* 1.77* 1.56* 1.45*  CALCIUM 6.8*   < > 6.7*   < > 7.6* 7.8* 8.1* 8.8* 9.1  MG 2.9*  --  2.2  --  2.1  --  1.8  --  1.9  PHOS 14.2*   < > 8.4*   < > 5.8* 4.9* 3.4 2.8 2.3*   < > = values in this interval not displayed.    GFR: Estimated Creatinine Clearance: 47.4 mL/min (A) (by C-G formula based on SCr of 1.45 mg/dL (H)). Recent Labs  Lab 05/07/21 0821 05/07/21 1046 05/08/21 0450 05/11/21 0531 05/12/21 1025 05/13/21 0311 05/14/21 0410  PROCALCITON  --   --   --   --   --  20.94 21.12  WBC 21.9*  --    < > 42.8* 40.8* 34.8* 28.4*  LATICACIDVEN 1.7 1.7  --   --   --   --   --    < > = values in this interval not displayed.     Liver Function Tests: Recent Labs  Lab 05/10/21 0450 05/11/21 0531 05/11/21 1600 05/12/21 0431 05/12/21 1025 05/12/21 1603 05/13/21 0311 05/13/21 1650 05/14/21 0410  AST 97* 4,359*  --  6,519* 6,250*  --  4,114*  --   --   ALT 36 1,746*  --  1,613* 1,711*  --  1,400*  --   --   ALKPHOS 145* 185*  --  232* 262*  --  266*  --   --   BILITOT 1.8* 2.5*  --  3.0* 3.9*  --  4.0*  --   --   PROT 5.0* 4.6*  --  5.3* 6.3*  --  5.9*  --   --   ALBUMIN <1.5* <1.5*   < > 2.1*   2.0* 2.5* 2.7* 2.4*   2.4* 2.6* 3.0*   < > = values in this interval not displayed.    Coagulation Profile: No results for input(s): INR, PROTIME in the last 168 hours.   Cardiac Enzymes: Recent Labs  Lab 05/08/21 1313 05/12/21 1603  CKTOTAL 1,575* 4,670*     HbA1C: Hgb A1c MFr Bld  Date/Time Value Ref Range Status  05/05/2021 04:53 AM 6.7 (H) 4.8 - 5.6 % Final    Comment:    (  NOTE)         Prediabetes:  5.7 - 6.4         Diabetes: >6.4         Glycemic control for adults with diabetes: <7.0   02/11/2021 09:41 AM 6.3 (H) <5.7 % of total Hgb Final    Comment:    For someone without known diabetes, a hemoglobin  A1c value between 5.7% and 6.4% is consistent with prediabetes and should be confirmed with a  follow-up test. . For someone with known diabetes, a value <7% indicates that their diabetes is well controlled. A1c targets should be individualized based on duration of diabetes, age, comorbid conditions, and other considerations. . This assay result is consistent with an increased risk of diabetes. . Currently, no consensus exists regarding use of hemoglobin A1c for diagnosis of diabetes for children. .     CBG: Recent Labs  Lab 05/13/21 1648 05/13/21 1957 05/13/21 2322 05/14/21 0330 05/14/21 0708  GLUCAP 146* 152* 172* 191* 177*    No Known Allergies   Medications  Scheduled Meds:  sodium chloride   Intravenous Once   vitamin C  500 mg Oral BID   budesonide (PULMICORT) nebulizer solution  0.25 mg Nebulization BID   chlorhexidine gluconate (MEDLINE KIT)  15 mL Mouth Rinse BID   Chlorhexidine Gluconate Cloth  6 each Topical Q0600   docusate  100 mg Per Tube BID   feeding supplement (PIVOT 1.5 CAL)  1,000 mL Per Tube Q24H   free water  30 mL Per Tube Q4H   heparin injection (subcutaneous)  5,000 Units Subcutaneous Q8H   hydrocortisone sod succinate (SOLU-CORTEF) inj  100 mg Intravenous Q8H   insulin aspart  1-3 Units Subcutaneous Q4H   insulin detemir  38 Units Subcutaneous BID   ipratropium-albuterol  3 mL Nebulization Q6H   mouth rinse  15 mL Mouth Rinse 10 times per day   multivitamin with minerals  1 tablet Per Tube Daily   pantoprazole (PROTONIX) IV  40 mg Intravenous Q24H   polyethylene glycol  17 g Per Tube Daily   sodium chloride flush  10-40 mL Intracatheter Q12H   zinc sulfate  220 mg Per Tube Daily   Continuous Infusions:  sodium chloride  Stopped (05/13/21 2154)   epinephrine 2.5 mcg/min (05/14/21 0700)   HYDROmorphone 0.5 mg/hr (05/14/21 0700)   levETIRAcetam 1,000 mg (05/13/21 2204)   linezolid (ZYVOX) IV 600 mg (05/13/21 2151)   norepinephrine (LEVOPHED) Adult infusion Stopped (05/12/21 1816)   pencillin G potassium IV Stopped (05/14/21 0542)   prismasol BGK 2/2.5 dialysis solution 1,500 mL/hr at 05/13/21 1810   prismasol BGK 2/2.5 replacement solution 500 mL/hr at 05/13/21 1800   prismasol BGK 2/2.5 replacement solution 500 mL/hr at 05/13/21 1805   vasopressin 0.02 Units/min (05/14/21 0700)   PRN Meds:.docusate sodium, heparin, HYDROmorphone, lip balm, midazolam, ondansetron (ZOFRAN) IV, oxyCODONE, sennosides, sodium chloride, sodium chloride flush     DVT/GI PRX  assessed I Assessed the need for Labs I Assessed the need for Foley I Assessed the need for Central Venous Line Family Discussion when available I Assessed the need for Mobilization I made an Assessment of medications to be adjusted accordingly Safety Risk assessment completed  CASE DISCUSSED IN MULTIDISCIPLINARY ROUNDS WITH ICU TEAM     Critical Care Time devoted to patient care services described in this note is 55 minutes.  Critical care was necessary to treat /prevent imminent and life-threatening deterioration. Overall, patient is critically ill, prognosis is guarded.  Patient with Multiorgan failure and at high risk for cardiac arrest and death.    Corrin Parker, M.D.  [facility]  Medical Director [place] Director [facility] Cardio-Pulmonary Department

## 2021-05-15 ENCOUNTER — Inpatient Hospital Stay: Payer: Medicare HMO

## 2021-05-15 DIAGNOSIS — G9340 Encephalopathy, unspecified: Secondary | ICD-10-CM

## 2021-05-15 DIAGNOSIS — D62 Acute posthemorrhagic anemia: Secondary | ICD-10-CM

## 2021-05-15 DIAGNOSIS — A419 Sepsis, unspecified organism: Secondary | ICD-10-CM | POA: Diagnosis not present

## 2021-05-15 DIAGNOSIS — J9621 Acute and chronic respiratory failure with hypoxia: Secondary | ICD-10-CM

## 2021-05-15 DIAGNOSIS — R6521 Severe sepsis with septic shock: Secondary | ICD-10-CM | POA: Diagnosis not present

## 2021-05-15 DIAGNOSIS — A400 Sepsis due to streptococcus, group A: Secondary | ICD-10-CM | POA: Diagnosis not present

## 2021-05-15 LAB — GLUCOSE, CAPILLARY
Glucose-Capillary: 163 mg/dL — ABNORMAL HIGH (ref 70–99)
Glucose-Capillary: 190 mg/dL — ABNORMAL HIGH (ref 70–99)
Glucose-Capillary: 198 mg/dL — ABNORMAL HIGH (ref 70–99)
Glucose-Capillary: 201 mg/dL — ABNORMAL HIGH (ref 70–99)
Glucose-Capillary: 213 mg/dL — ABNORMAL HIGH (ref 70–99)
Glucose-Capillary: 242 mg/dL — ABNORMAL HIGH (ref 70–99)

## 2021-05-15 LAB — RENAL FUNCTION PANEL
Albumin: 2.6 g/dL — ABNORMAL LOW (ref 3.5–5.0)
Albumin: 2.4 g/dL — ABNORMAL LOW (ref 3.5–5.0)
Anion gap: 9 (ref 5–15)
Anion gap: 12 (ref 5–15)
BUN: 48 mg/dL — ABNORMAL HIGH (ref 8–23)
BUN: 51 mg/dL — ABNORMAL HIGH (ref 8–23)
CO2: 24 mmol/L (ref 22–32)
CO2: 21 mmol/L — ABNORMAL LOW (ref 22–32)
Calcium: 9.2 mg/dL (ref 8.9–10.3)
Calcium: 9.3 mg/dL (ref 8.9–10.3)
Chloride: 98 mmol/L (ref 98–111)
Chloride: 99 mmol/L (ref 98–111)
Creatinine, Ser: 1.66 mg/dL — ABNORMAL HIGH (ref 0.44–1.00)
Creatinine, Ser: 1.43 mg/dL — ABNORMAL HIGH (ref 0.44–1.00)
GFR, Estimated: 33 mL/min — ABNORMAL LOW (ref >60)
GFR, Estimated: 39 mL/min — ABNORMAL LOW (ref >60)
Glucose, Bld: 228 mg/dL — ABNORMAL HIGH (ref 70–99)
Glucose, Bld: 241 mg/dL — ABNORMAL HIGH (ref 70–99)
Phosphorus: 2.3 mg/dL — ABNORMAL LOW (ref 2.5–4.6)
Phosphorus: 2.6 mg/dL (ref 2.5–4.6)
Potassium: 3.8 mmol/L (ref 3.5–5.1)
Potassium: 3.3 mmol/L — ABNORMAL LOW (ref 3.5–5.1)
Sodium: 131 mmol/L — ABNORMAL LOW (ref 135–145)
Sodium: 132 mmol/L — ABNORMAL LOW (ref 135–145)

## 2021-05-15 LAB — CBC WITH DIFFERENTIAL/PLATELET
Abs Immature Granulocytes: 0.33 10*3/uL — ABNORMAL HIGH (ref 0.00–0.07)
Basophils Absolute: 0.1 10*3/uL (ref 0.0–0.1)
Basophils Relative: 0 %
Eosinophils Absolute: 0 10*3/uL (ref 0.0–0.5)
Eosinophils Relative: 0 %
HCT: 26.5 % — ABNORMAL LOW (ref 36.0–46.0)
Hemoglobin: 8.7 g/dL — ABNORMAL LOW (ref 12.0–15.0)
Immature Granulocytes: 1 %
Lymphocytes Relative: 3 %
Lymphs Abs: 0.7 10*3/uL (ref 0.7–4.0)
MCH: 28.9 pg (ref 26.0–34.0)
MCHC: 32.8 g/dL (ref 30.0–36.0)
MCV: 88 fL (ref 80.0–100.0)
Monocytes Absolute: 0.6 10*3/uL (ref 0.1–1.0)
Monocytes Relative: 3 %
Neutro Abs: 22.7 10*3/uL — ABNORMAL HIGH (ref 1.7–7.7)
Neutrophils Relative %: 93 %
Platelets: 122 10*3/uL — ABNORMAL LOW (ref 150–400)
RBC: 3.01 MIL/uL — ABNORMAL LOW (ref 3.87–5.11)
RDW: 19.8 % — ABNORMAL HIGH (ref 11.5–15.5)
WBC: 24.5 10*3/uL — ABNORMAL HIGH (ref 4.0–10.5)
nRBC: 15.5 % — ABNORMAL HIGH (ref 0.0–0.2)

## 2021-05-15 LAB — TYPE AND SCREEN
ABO/RH(D): O POS
Antibody Screen: NEGATIVE
Unit division: 0

## 2021-05-15 LAB — TROPONIN I (HIGH SENSITIVITY)
Troponin I (High Sensitivity): 338 ng/L (ref <18)
Troponin I (High Sensitivity): 305 ng/L (ref <18)

## 2021-05-15 LAB — BPAM RBC
Blood Product Expiration Date: 202303032359
ISSUE DATE / TIME: 202301310743
Unit Type and Rh: 5100

## 2021-05-15 LAB — T3: T3, Total: 50 ng/dL — ABNORMAL LOW (ref 71–180)

## 2021-05-15 LAB — HEPATIC FUNCTION PANEL
ALT: 749 U/L — ABNORMAL HIGH (ref 0–44)
AST: 639 U/L — ABNORMAL HIGH (ref 15–41)
Albumin: 2.6 g/dL — ABNORMAL LOW (ref 3.5–5.0)
Alkaline Phosphatase: 328 U/L — ABNORMAL HIGH (ref 38–126)
Bilirubin, Direct: 3.5 mg/dL — ABNORMAL HIGH (ref 0.0–0.2)
Indirect Bilirubin: 1.5 mg/dL — ABNORMAL HIGH (ref 0.3–0.9)
Total Bilirubin: 5 mg/dL — ABNORMAL HIGH (ref 0.3–1.2)
Total Protein: 6.6 g/dL (ref 6.5–8.1)

## 2021-05-15 LAB — MAGNESIUM: Magnesium: 1.9 mg/dL (ref 1.7–2.4)

## 2021-05-15 LAB — TRIGLYCERIDES: Triglycerides: 121 mg/dL (ref <150)

## 2021-05-15 MED ORDER — POTASSIUM PHOSPHATES 15 MMOLE/5ML IV SOLN
20.0000 mmol | Freq: Once | INTRAVENOUS | Status: DC
  Filled 2021-05-15: qty 6.67

## 2021-05-15 MED ORDER — AMIODARONE HCL 200 MG PO TABS
200.0000 mg | ORAL_TABLET | Freq: Two times a day (BID) | ORAL | Status: DC
  Filled 2021-05-15: qty 1

## 2021-05-15 MED ORDER — METOCLOPRAMIDE HCL 5 MG/ML IJ SOLN
10.0000 mg | Freq: Three times a day (TID) | INTRAMUSCULAR | Status: DC
  Administered 2021-05-15 – 2021-05-16 (×3): 10 mg via INTRAVENOUS
  Filled 2021-05-15 (×3): qty 2

## 2021-05-15 MED ORDER — AMIODARONE HCL IN DEXTROSE 360-4.14 MG/200ML-% IV SOLN
INTRAVENOUS | Status: AC
  Administered 2021-05-15: 30 mg/h via INTRAVENOUS
  Filled 2021-05-15: qty 200

## 2021-05-15 MED ORDER — AMIODARONE HCL 200 MG PO TABS: 200.0000 mg | ORAL_TABLET | Freq: Every day | ORAL | Status: DC

## 2021-05-15 MED ORDER — REVEFENACIN 175 MCG/3ML IN SOLN
175.0000 ug | Freq: Every day | RESPIRATORY_TRACT | Status: DC
  Administered 2021-05-15 – 2021-05-23 (×9): 175 ug via RESPIRATORY_TRACT
  Filled 2021-05-15 (×9): qty 3

## 2021-05-15 MED ORDER — AMIODARONE HCL IN DEXTROSE 360-4.14 MG/200ML-% IV SOLN
30.0000 mg/h | INTRAVENOUS | Status: DC
  Administered 2021-05-15 – 2021-05-20 (×10): 30 mg/h via INTRAVENOUS
  Filled 2021-05-15 (×11): qty 200

## 2021-05-15 MED ORDER — MAGIC MOUTHWASH
10.0000 mL | Freq: Four times a day (QID) | ORAL | Status: DC
  Administered 2021-05-15 – 2021-05-16 (×2): 10 mL via ORAL
  Filled 2021-05-15 (×6): qty 10
  Filled 2021-05-15: qty 30
  Filled 2021-05-15 (×4): qty 10
  Filled 2021-05-15: qty 20
  Filled 2021-05-15 (×7): qty 10

## 2021-05-15 MED ORDER — LORAZEPAM 2 MG/ML IJ SOLN: 2.0000 mg | Freq: Once | INTRAMUSCULAR | Status: AC

## 2021-05-15 MED ORDER — ATROPINE SULFATE 1 MG/10ML IJ SOSY
PREFILLED_SYRINGE | INTRAMUSCULAR | Status: AC
  Filled 2021-05-15: qty 10

## 2021-05-15 MED ORDER — POTASSIUM & SODIUM PHOSPHATES 280-160-250 MG PO PACK
1.0000 | PACK | Freq: Three times a day (TID) | ORAL | Status: AC
  Administered 2021-05-15 (×3): 1
  Filled 2021-05-15 (×3): qty 1

## 2021-05-15 MED ORDER — POTASSIUM CHLORIDE 20 MEQ PO PACK
20.0000 meq | PACK | Freq: Once | ORAL | Status: AC
  Administered 2021-05-15: 20 meq
  Filled 2021-05-15: qty 1

## 2021-05-15 MED ORDER — LORAZEPAM 2 MG/ML IJ SOLN
INTRAMUSCULAR | Status: AC
  Administered 2021-05-15: 2 mg via INTRAVENOUS
  Filled 2021-05-15: qty 1

## 2021-05-15 MED ORDER — LACTULOSE 10 GM/15ML PO SOLN
20.0000 g | Freq: Two times a day (BID) | ORAL | Status: DC
  Administered 2021-05-15: 20 g
  Filled 2021-05-15: qty 30

## 2021-05-15 MED ORDER — AMIODARONE HCL IN DEXTROSE 360-4.14 MG/200ML-% IV SOLN: 30.0000 mg/h | INTRAVENOUS | Status: AC

## 2021-05-15 MED ORDER — ARFORMOTEROL TARTRATE 15 MCG/2ML IN NEBU
15.0000 ug | INHALATION_SOLUTION | Freq: Two times a day (BID) | RESPIRATORY_TRACT | Status: DC
  Administered 2021-05-15 – 2021-05-23 (×17): 15 ug via RESPIRATORY_TRACT
  Filled 2021-05-15 (×17): qty 2

## 2021-05-15 NOTE — Progress Notes (Signed)
Nutrition Follow-up  DOCUMENTATION CODES:   Morbid obesity  INTERVENTION:   -Continue renal MVI daily via tube -Continue 500 mg vitamin C BID via tube -Continue 220 mg zinc daily via tube -Continue Vital AF 1.2 @ 50 ml/hr via OGT   90 ml Prosource TF BID.     30 ml free water flush every 6 hours to maintain tube patency   Tube feeding regimen provides 1600 kcal (100% of needs), 134 grams of protein, and 973 ml of H2O. Total free water: 1153 ml daily  TF + propofol provides 1872 kcals  -1 packet Juven BID, each packet provides 95 calories, 2.5 grams of protein (collagen), and 9.8 grams of carbohydrate (3 grams sugar); also contains 7 grams of L-arginine and L-glutamine, 300 mg vitamin C, 15 mg vitamin E, 1.2 mcg vitamin B-12, 9.5 mg zinc, 200 mg calcium, and 1.5 g  Calcium Beta-hydroxy-Beta-methylbutyrate to support wound healing   NUTRITION DIAGNOSIS:   Increased nutrient needs related to wound healing as evidenced by estimated needs.  Ongoing  GOAL:   Patient will meet greater than or equal to 90% of their needs  Met with TF  MONITOR:   PO intake, Supplement acceptance, Labs, Weight trends, Skin, I & O's  REASON FOR ASSESSMENT:   Rounds    ASSESSMENT:   71 y.o. female with a history of asthma/COPD, HTN, DMII, obesity, OSA on CPAP, GERD and COVID-19 pneumonia who was admitted 1/22 w/ left LE cellulitis, septic shock, S pyogenes bacteremia, hypotension, new Afib and AKI.  1/24- s/p I&D of lt leg 1/27- s/p PROCEDURE(S):  1.) Sharp excisional debridement of left leg 2.) Veraflow (negative pressure dressing (DME) placement); NGT placed for enteral nutrition; intubated 1/28- CRRT initiated 1/30- IVIG started due to streptococcal toxic shock syndrome  Patient is currently intubated on ventilator support MV: 11.8 L/min Temp (24hrs), Avg:97.5 F (36.4 C), Min:96.4 F (35.8 C), Max:99.5 F (37.5 C)  Propofol: 10.3 ml/hr (provides 272 kcals)   Case discussed with  RN. MD, and during ICU rounds.   Pt remains ion CRRT. Per RN, pt is till +12 L and took 0.5 L yesterday. She is still not making urine, but has a foley.   Pt with small SAH; per neurology no further needs at this time.   Pt remains with swelling and bleeding gums with oral mucosa sloughing off; per MD< likey related to TSS or Katherina Right syndrome.   Plan to stop steroids today. Pt has not had a BM yet; KUB show contrast in the gut. Lactulose and reglan started (pt had BM after rounding). Pt tolerating TF well.  Medications reviewed and include vitamin C, colace, lactulose, keppra, reglan, miralax, zinc sulfate,   Labs reviewed: Na: 131, CBGS: 190-213 (inpatient orders for glycemic control are 1-3 units insulin aspart every 4 hours and 38 units insulin detemir BID).    Diet Order:   Diet Order             Diet NPO time specified  Diet effective midnight                   EDUCATION NEEDS:   Education needs have been addressed  Skin:  Skin Assessment: Skin Integrity Issues: Skin Integrity Issues:: Wound VAC Wound Vac: closed lt leg  Last BM:  05/15/21  Height:   Ht Readings from Last 1 Encounters:  05/04/21 '5\' 5"'  (1.651 m)    Weight:   Wt Readings from Last 1 Encounters:  05/15/21 128.4 kg  Ideal Body Weight:  56.8 kg  BMI:  Body mass index is 47.11 kg/m.  Estimated Nutritional Needs:   Kcal:  1250-1420  Protein:  130-145 grams  Fluid:  > 1.2 L    Loistine Chance, RD, LDN, [place] Registered Dietitian II Certified Diabetes Care and Education Specialist Please refer to [facility] for RD and/or RD on-call/weekend/after hours pager

## 2021-05-15 NOTE — Plan of Care (Signed)
MRI brain shows small SAH; NSU has been consulted. Per ICU team, no further neurology needs at this time, as patient will be undergoing trach when stable to do so. Neurology to sign off, but please re-engage if additional neurologic concerns arise.   Su Monks, MD [facility] [phone]  If 7pm- 7am, please page neurology on call as listed in [place].,mss

## 2021-05-15 NOTE — Progress Notes (Signed)
Date of Admission:  05/04/2021     ID: Alexandra Foster is a 71 y.o. female Principal Problem:   Severe sepsis with septic shock (CODE) ([place]) Active Problems:   COPD (chronic obstructive pulmonary disease) ([place])   Morbid obesity ([place])   Diabetes mellitus type 2 in obese ([place])   Atrial fibrillation and flutter (HCC)   Necrotizing fasciitis ([place])   Acute on chronic respiratory failure ([place])   On mechanically assisted ventilation ([place])   Shock liver   Anemia associated with acute blood loss   Metabolic acidosis   Encounter for continuous renal replacement therapy (CRRT) for acute renal failure (HCC)   Acute encephalopathy   Group A streptococcal infection   Streptococcal toxic shock syndrome ([place])    Subjective: Patient remains intubated and hence no review of system available As per her nurse t she was on 1 pressor till this morning and she has turned it off at 7 AM.  Blood pressure is well maintained now.  Medications:   amiodarone  200 mg Per Tube BID   Followed by   Derrill Memo ON 05/22/2021] amiodarone  200 mg Per Tube Daily   arformoterol  15 mcg Nebulization BID   vitamin C  500 mg Per Tube BID   atropine       budesonide (PULMICORT) nebulizer solution  0.25 mg Nebulization BID   chlorhexidine gluconate (MEDLINE KIT)  15 mL Mouth Rinse BID   Chlorhexidine Gluconate Cloth  6 each Topical Q0600   docusate  100 mg Per Tube BID   feeding supplement (PROSource TF)  90 mL Per Tube BID   free water  30 mL Per Tube Q4H   insulin aspart  1-3 Units Subcutaneous Q4H   insulin detemir  38 Units Subcutaneous BID   lactulose  20 g Per Tube BID   levETIRAcetam  1,000 mg Per Tube BID   linezolid  600 mg Per Tube Q12H   magic mouthwash  10 mL Oral QID   mouth rinse  15 mL Mouth Rinse 10 times per day   metoCLOPramide (REGLAN) injection  10 mg Intravenous Q8H   midodrine  10 mg Per Tube TID WC   multivitamin  1 tablet Per Tube QHS   nutrition supplement (JUVEN)  1 packet Per Tube  BID BM   pantoprazole (PROTONIX) IV  40 mg Intravenous Q24H   polyethylene glycol  17 g Per Tube Daily   potassium & sodium phosphates  1 packet Per Tube TID WC & HS   revefenacin  175 mcg Nebulization Daily   sodium chloride flush  10-40 mL Intracatheter Q12H   zinc sulfate  220 mg Per Tube Daily    Objective: Vital signs in last 24 hours: Temp:  [96.4 F (35.8 C)-99.5 F (37.5 C)] 97.7 F (36.5 C) (02/01 1600) Pulse Rate:  [30-151] 151 (02/01 1600) Resp:  [16-36] 28 (02/01 1600) BP: (95-151)/(42-131) 102/53 (02/01 1545) SpO2:  [97 %-100 %] 99 % (02/01 1600) FiO2 (%):  [35 %-40 %] 35 % (02/01 1459) Weight:  [128.4 kg] 128.4 kg (02/01 0452)  PHYSICAL EXAM:  General: Intubated and sedated. Head: Normocephalic, without obvious abnormality, atraumatic. Eyes: Conjunctivae clear, anicteric sclerae. Pupils are equal Oral cavity tongue has dried blood Neck: Bilateral IJ lines No carotid bruit and no JVD. Lungs: Bilateral air entry Heart: Regular rate and rhythm, no murmur, rub or gallop. Abdomen: Soft, non-tender,not distended. Bowel sounds normal. No masses Extremities: Right lower extremity wound VAC present  Picture reviewed  skin: No rashes or lesions. Or bruising Lymph: Cervical, supraclavicular normal. Neurologic: Cannot be assessed  Lab Results Recent Labs    05/14/21 0410 05/14/21 1056 05/14/21 1555 05/15/21 0313  WBC 28.4*  --   --  24.5*  HGB 6.7* 8.0*  --  8.7*  HCT 20.5* 23.5*  --  26.5*  NA 133*  --  129* 131*  K 3.6  --  3.9 3.8  CL 98  --  96* 98  CO2 26  --  23 24  BUN 33*  --  39* 48*  CREATININE 1.45*  --  1.75* 1.66*   Liver Panel Recent Labs    05/13/21 0311 05/13/21 1650 05/14/21 1555 05/15/21 0313  PROT 5.9*  --   --  6.6  ALBUMIN 2.4*   2.4*   < > 2.9* 2.6*   2.6*  AST 4,114*  --   --  639*  ALT 1,400*  --   --  749*  ALKPHOS 266*  --   --  328*  BILITOT 4.0*  --   --  5.0*  BILIDIR 2.7*  --   --  3.5*  IBILI 1.3*  --   --   1.5*   < > = values in this interval not displayed.   Sedimentation Rate Recent Labs    05/13/21 0311  ESRSEDRATE 59*   C-Reactive Protein No results for input(s): CRP in the last 72 hours.  Microbiology:  05/04/21 BC Group A strep 4/4 1of 4 staph epidermidis- a contaminant 05/07/21- Wound culture NG 05/09/21 BC-NG Studies/Results: DG Chest 1 View  Result Date: 05/15/2021 CLINICAL DATA:  71 year old female with respiratory failure. Sepsis. EXAM: CHEST  1 VIEW COMPARISON:  Portable chest 05/11/2021 and earlier. FINDINGS: Portable AP supine view at 0609 hours. The patient remains mildly rotated to the right. Endotracheal tube tip in good position between the clavicles and carina. Stable left IJ approach dual lumen vascular catheter. Enteric tube and pH probe in place and appears stable. Right IJ central line tip now projects at the level the right atrium although lung volumes are slightly lower. Stable cardiac size and mediastinal contours. No pneumothorax. No pleural effusion or consolidation. Increased pulmonary vascularity since 05/10/2021. Paucity of bowel gas. No acute osseous abnormality identified. IMPRESSION: 1. Essentially stable lines and tubes. 2. Lower lung volumes with increased pulmonary vascularity. Consider mild or developing interstitial edema. No pleural effusion or consolidation. Electronically Signed   By: Genevie Ann M.D.   On: 05/15/2021 06:46   DG Abd 1 View  Result Date: 05/15/2021 CLINICAL DATA:  71 year old female with respiratory failure. Sepsis. EXAM: ABDOMEN - 1 VIEW COMPARISON:  05/11/2021. FINDINGS: Portable AP supine views at 0603 hours. Enteric tube terminates in the gastric body. Side hole appears to be inside the stomach. There is mild to moderate gastric distention with air. A small volume of fundal oral contrast persists. Paucity of bowel gas elsewhere in the visible abdomen and pelvis. Left hemipelvis phlebolith. No acute osseous abnormality identified. IMPRESSION:  1. Satisfactory enteric tube termination in the stomach. Mild to moderate gastric distention with air. Small volume oral contrast suspected in the gastric fundus. 2. Paucity of bowel gas elsewhere in the abdomen and pelvis. Electronically Signed   By: Genevie Ann M.D.   On: 05/15/2021 06:47   CT HEAD WO CONTRAST (5MM)  Addendum Date: 05/14/2021   ADDENDUM REPORT: 05/14/2021 16:47 ADDENDUM: These results will be called to the ordering clinician or representative by the Radiologist Assistant, and  communication documented in the PACS or [employer]. Electronically Signed   By: Franchot Gallo M.D.   On: 05/14/2021 16:47   Result Date: 05/14/2021 CLINICAL DATA:  Subarachnoid hemorrhage.  Sepsis. EXAM: CT HEAD WITHOUT CONTRAST TECHNIQUE: Contiguous axial images were obtained from the base of the skull through the vertex without intravenous contrast. RADIATION DOSE REDUCTION: This exam was performed according to the departmental dose-optimization program which includes automated exposure control, adjustment of the mA and/or kV according to patient size and/or use of iterative reconstruction technique. COMPARISON:  MRI head 05/14/2021 FINDINGS: Brain: Hyperdensity in the left middle frontal sulcus. This corresponds to the FLAIR hyperintensity on MRI and is most consistent with small volume acute subarachnoid hemorrhage on the left. No other areas of high-density hemorrhage. Ventricle size normal.  Negative for acute infarct or mass. Empty sella with enlargement of the sella filled with CSF. Small pituitary. Vascular: Negative for hyperdense vessel Skull: Negative Sinuses/Orbits: Extensive mucosal edema paranasal sinuses. Probable air-fluid levels in the maxillary sinus bilaterally. Bilateral proptosis without orbital mass. Other: None IMPRESSION: Hyperintensity left middle frontal sulcus compatible with acute subarachnoid hemorrhage, small volume. No other acute intracranial abnormality. No hydrocephalus or infarct.  Extensive paranasal sinus mucosal disease with air-fluid levels. Electronically Signed: By: Franchot Gallo M.D. On: 05/14/2021 15:59   MR ANGIO HEAD WO CONTRAST  Result Date: 05/15/2021 CLINICAL DATA:  Subarachnoid hemorrhage EXAM: MRA HEAD WITHOUT CONTRAST TECHNIQUE: Angiographic images of the Circle of Willis were acquired using MRA technique without intravenous contrast. COMPARISON:  No prior MRA, correlation is made with MRI 05/14/2021 FINDINGS: Anterior circulation: Both internal carotid arteries are patent to the termini, without significant stenosis. A1 segments patent. Normal anterior communicating artery. Anterior cerebral arteries are patent to their distal aspects. No M1 stenosis or occlusion. Normal MCA bifurcations. Distal MCA branches perfused and symmetric. Posterior circulation: Vertebral arteries patent to the vertebrobasilar junction without stenosis. Basilar patent to its distal aspect. Superior cerebellar arteries patent bilaterally. Patent P1 segments. PCAs perfused to their distal aspects without stenosis. The bilateral posterior communicating arteries are patent. Anatomic variants: None significant Other: None. IMPRESSION: No intracranial large vessel occlusion or significant stenosis. No aneurysm or other vascular abnormality. Electronically Signed   By: Merilyn Baba M.D.   On: 05/15/2021 02:43   MR BRAIN WO CONTRAST  Result Date: 05/14/2021 CLINICAL DATA:  Altered mental status, septic shock secondary to left lower extremity cellulitis EXAM: MRI HEAD WITHOUT CONTRAST TECHNIQUE: Multiplanar, multiecho pulse sequences of the brain and surrounding structures were obtained without intravenous contrast. COMPARISON:  Brain MRI 08/10/2013 FINDINGS: Brain: There is no evidence of acute infarct. There is sulcal FLAIR hyperintensity with trace SWI signal dropout overlying a left frontal lobe sulcus and the right superior frontal sulcus (15-35, 15-39). There is no other evidence of acute  intracranial hemorrhage or extra-axial fluid collection. Parenchymal volume is normal. The ventricles are normal in size. Parenchymal signal is normal, with no significant burden of white matter microangiopathic change. A partially empty sella is noted. There is no mass lesion. There is no midline shift. Vascular: Normal flow voids. Skull and upper cervical spine: Normal marrow signal. Sinuses/Orbits: There is mucosal thickening throughout the paranasal sinuses with layering fluid in the maxillary sinuses and complete opacification of the mastoid air cells, likely at least partially related to intubation. The globes and orbits are unremarkable. Other: There is scalp fluid predominantly in the bilateral occipital regions. 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 IMPRESSION: 1. Sulcal FLAIR hyperintensity  overlying bilateral frontal lobe sulci may be artifactual related to intubation; however, subarachnoid hemorrhage can have a similar appearance. Differential also includes meningitis. Recommend noncontrast CT for further evaluation for subarachnoid hemorrhage. Consider correlation with lumbar puncture to evaluate for meningitis as indicated, if the CT head is negative for hemorrhage. 2. Fluid in the bilateral occipital scalp. These results were called by telephone at the time of interpretation on 05/14/2021 at 3:10 pm to provider DANA GRAVES NP, who verbally acknowledged these results. Electronically Signed   By: Valetta Mole M.D.   On: 05/14/2021 15:11   EEG adult  Result Date: 05/13/2021 Derek Jack, MD     05/13/2021  8:16 PM Routine EEG Report Alexandra Foster is a 71 y.o. female with a history of septic shock and encephalopathy who is undergoing an EEG to evaluate for seizures. Report: This EEG was acquired with electrodes placed according to the International 10-20 electrode system (including Fp1, Fp2, F3, F4, C3, C4, P3, P4, O1, O2, T3, T4, T5, T6, A1, A2, Fz, Cz, Pz). The following electrodes  were missing or displaced: none. The best background was continuous at 3-4 Hz. This activity is reactive to stimulation. No sleep architecture was identified. There were frequent triphasic waves that did not appear epileptiform. There was no focal slowing. There were no interictal epileptiform discharges. There were no electrographic seizures identified. Photic stimulation and hyperventilation were not performed. Impression and clinical correlation: This EEG was obtained while sedated on hydromorphone and is abnormal due to: - Severe diffuse slowing indicative of global cerebral dysfunction - Frequent triphasic waves indicative of metabolic encephalopathy Epileptiform abnormalities were not seen during this recording. Su Monks, MD [facility] [phone] If 7pm- 7am, please page neurology on call as listed in [place].     Assessment/Plan:  Streptococcus pyogenes bacteremia with septic shock and multiorgan failure.  Left leg necrotizing fasciitis. Status post debridement and due to on 05/07/2021 and 05/10/2021.  Now has a wound VAC.  She is going to need full-thickness graft Will need plastic surgery to see her Patient is currently on ceftriaxone and linezolid.  She was on penicillin but because of fluid overload it was changed to ceftriaxone on 05/14/2021. She also received 3 doses of IVIG  Leukocytosis has been trending down now  Acute respiratory failure patient remains intubated  Encephalopathy MRI showed small subarachnoid hemorrhage.  MRA was normal  AKI a combination of septic shock and hypovolemic shock.  On CRRT since 05/11/2021.  Transaminitis and hyperbilirubinemia secondary to shock has improved  Anemia has received PRBC in the past.  Hemoglobin dropping again.  The blood loss is from the left leg  Severe hypoalbuminemia with third spacing.  Has been getting albumin until yesterday  Diabetes mellitus On sliding scale  A. fib now in sinus rhythm back on  amiodarone  Discussed the management with the nurse and the care team.

## 2021-05-15 NOTE — Progress Notes (Signed)
Pt transported to MRI and returned to ICU 4 on the vent without incident. Pt remains on the vent and is tol well at this time.

## 2021-05-15 NOTE — Progress Notes (Signed)
GOALS OF CARE DISCUSSION  The Clinical status was relayed to family in detail. Daughter At Bedside Updated and notified of patients medical condition.    Patient remains unresponsive and will not open eyes to command.   Patient is having a weak cough and struggling to remove secretions.   Patient with increased WOB and using accessory muscles to breathe Explained to family course of therapy and the modalities    Patient with Progressive multiorgan failure with a very high probablity of a very minimal chance of meaningful recovery despite all aggressive and optimal medical therapy.  PATIENT REMAINS FULL CODE  Family understands the situation.  Patient has had multiple intubations and with lip and tongue swelling, she will need a trach for survival, she has agreed and consented to [facility] Will consult ENT  Family are satisfied with Plan of action and management. All questions answered  Additional CC time 35 mins   Brenee Gajda Patricia Pesa, M.D.  [facility]  Medical Director [place] Director [facility] Cardio-P[facility] Department

## 2021-05-15 NOTE — Consult Note (Signed)
Alexandra Foster, Alexandra Foster [id number] Aug 26, 1950 [name], MD  Reason for Consult: Respiratory failure, need for tracheostomy Requesting Physician: Flora Lipps, MD Consulting Physician: [name], MD  HPI: This 71 y.o. year old female was admitted on 05/04/2021 for AKI (acute kidney injury) [facility]) [N17.9] Left leg cellulitis [L03.116] Severe sepsis ([place]) [A41.9, R65.20] Severe sepsis with lactic acidosis ([place]) [A41.9, R65.20, E87.20].  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  The patient has failed extubation and the CCU service is recommended proceeding with a tracheostomy.  She notably has a small subarachnoid hemorrhage on CT scan.  Apparently they have stopped anticoagulants but she did have an elevated INR on the 31st and the platelet count is a little low though acceptable at 121 when last evaluated.  Medications:  Current Facility-Administered Medications  Medication Dose Route Frequency Provider Last Rate Last Admin   0.9 %  sodium chloride infusion  250 mL Intravenous Continuous Bennie Pierini, MD   Stopped at 05/13/21 2154   amiodarone (NEXTERONE PREMIX) 360-4.14 MG/200ML-% (1.8 mg/mL) IV infusion  30 mg/hr Intravenous Continuous Flora Lipps, MD 16.67 mL/hr at 05/15/21 1700 30 mg/hr at 05/15/21 1700   amiodarone (NEXTERONE PREMIX) 360-4.14 MG/200ML-% (1.8 mg/mL) IV infusion  30 mg/hr Intravenous Continuous Flora Lipps, MD       amiodarone (PACERONE) tablet 200 mg  200 mg Per Tube BID Flora Lipps, MD       Followed by   Derrill Memo ON 05/22/2021] amiodarone (PACERONE) tablet 200 mg  200 mg Per Tube Daily Flora Lipps, MD       arformoterol (BROVANA) nebulizer solution 15 mcg  15 mcg Nebulization BID Flora Lipps, MD   15 mcg at 05/15/21 1132   ascorbic acid (VITAMIN C) tablet 500 mg  500 mg Per Tube BID Flora Lipps, MD    500 mg at 05/15/21 0939   atropine 1 MG/10ML injection            budesonide (PULMICORT) nebulizer solution 0.25 mg  0.25 mg Nebulization BID Rust-Chester, Britton L, NP   0.25 mg at 05/15/21 0742   cefTRIAXone (ROCEPHIN) 2 g in sodium chloride 0.9 % 100 mL IVPB  2 g Intravenous Q24H Tsosie Billing, MD   Stopped at 05/15/21 1642   chlorhexidine gluconate (MEDLINE KIT) (PERIDEX) 0.12 % solution 15 mL  15 mL Mouth Rinse BID Tyler Pita, MD   15 mL at 05/15/21 0809   Chlorhexidine Gluconate Cloth 2 % PADS 6 each  6 each Topical Q0600 Bennie Pierini, MD   6 each at 05/15/21 0558   docusate (COLACE) 50 MG/5ML liquid 100 mg  100 mg Per Tube BID Lang Snow, NP   100 mg at 05/15/21 1856   docusate sodium (COLACE) capsule 100 mg  100 mg Oral BID PRN Lang Snow, NP       feeding supplement (PROSource TF) liquid 90 mL  90 mL Per Tube BID Flora Lipps, MD   90 mL at 05/15/21 0939   feeding supplement (VITAL AF 1.2 CAL) liquid 1,000 mL  1,000 mL Per Tube Continuous Flora Lipps, MD 40 mL/hr at 05/15/21 0400 Rate Change at 05/15/21 0400   fentaNYL (SUBLIMAZE) injection 25-50 mcg  25-50 mcg Intravenous Q2H PRN Milus Banister, NP   50 mcg at 05/15/21 1338   free water 30 mL  30 mL Per Tube Q4H Kasa,  Maretta Bees, MD   30 mL at 05/15/21 1614   heparin injection 1,000-6,000 Units  1,000-6,000 Units CRRT PRN Lang Snow, NP   1,600 Units at 05/15/21 0125   insulin aspart (novoLOG) injection 1-3 Units  1-3 Units Subcutaneous Q4H Rust-Chester, Huel Cote, NP   3 Units at 05/15/21 1620   insulin detemir (LEVEMIR) injection 38 Units  38 Units Subcutaneous BID Rust-Chester, Britton L, NP   38 Units at 05/15/21 0938   lactulose (CHRONULAC) 10 GM/15ML solution 20 g  20 g Per Tube BID Flora Lipps, MD   20 g at 05/15/21 1108   levETIRAcetam (KEPPRA) 100 MG/ML solution 1,000 mg  1,000 mg Per Tube BID Flora Lipps, MD   1,000 mg at 05/15/21 0939   linezolid (ZYVOX) tablet 600  mg  600 mg Per Tube Q12H Benita Gutter, RPH   600 mg at 05/15/21 3149   lip balm (BLISTEX) ointment   Topical PRN Flora Lipps, MD   Given at 05/14/21 2205   magic mouthwash  10 mL Oral QID Flora Lipps, MD       MEDLINE mouth rinse  15 mL Mouth Rinse 10 times per day Tyler Pita, MD   15 mL at 05/15/21 1600   metoCLOPramide (REGLAN) injection 10 mg  10 mg Intravenous Q8H Flora Lipps, MD   10 mg at 05/15/21 1338   midazolam (VERSED) injection 2 mg  2 mg Intravenous Q1H PRN Rust-Chester, Huel Cote, NP       midodrine (PROAMATINE) tablet 10 mg  10 mg Per Tube TID WC Flora Lipps, MD   10 mg at 05/15/21 1621   multivitamin (RENA-VIT) tablet 1 tablet  1 tablet Per Tube QHS Flora Lipps, MD       nutrition supplement (JUVEN) (JUVEN) powder packet 1 packet  1 packet Per Tube BID BM Flora Lipps, MD   1 packet at 05/15/21 1339   ondansetron (ZOFRAN) injection 4 mg  4 mg Intravenous Q6H PRN Tyler Pita, MD   4 mg at 05/10/21 1201   oxyCODONE (Oxy IR/ROXICODONE) immediate release tablet 10 mg  10 mg Per Tube Q6H PRN Rust-Chester, Huel Cote, NP       pantoprazole (PROTONIX) injection 40 mg  40 mg Intravenous Q24H Rust-Chester, Britton L, NP   40 mg at 05/15/21 0451   phenylephrine (NEO-SYNEPHRINE) 40m/NS 2550mpremix infusion  0-400 mcg/min Intravenous Titrated KaFlora LippsMD 37.5 mL/hr at 05/15/21 1722 50 mcg/min at 05/15/21 1722   polyethylene glycol (MIRALAX / GLYCOLAX) packet 17 g  17 g Per Tube Daily Tukov-Yual, Magdalene S, NP   17 g at 05/15/21 0939   potassium & sodium phosphates (PHOS-NAK) 280-160-250 MG packet 1 packet  1 packet Per Tube TID WC & HS ChBenita GutterRPH   1 packet at 05/15/21 1621   prismasol BGK 2/2.5 dialysis solution   CRRT Continuous BhTheador HawthorneManpreet S, MD 1,500 mL/hr at 05/15/21 1556 New Bag at 05/15/21 1556   prismasol BGK 2/2.5 replacement solution   CRRT Continuous BhTheador HawthorneManpreet S, MD 500 mL/hr at 05/15/21 1555 New Bag at 05/15/21 1555   prismasol  BGK 2/2.5 replacement solution   CRRT Continuous BhTheador HawthorneManpreet S, MD 500 mL/hr at 05/15/21 1554 New Bag at 05/15/21 1554   propofol (DIPRIVAN) 1000 MG/100ML infusion  5-80 mcg/kg/min Intravenous Titrated KaFlora LippsMD 10.03 mL/hr at 05/15/21 1700 13 mcg/kg/min at 05/15/21 1700   revefenacin (YUPELRI) nebulizer solution 175 mcg  175 mcg Nebulization Daily Kasa,  Maretta Bees, MD   175 mcg at 05/15/21 1132   sennosides (SENOKOT) 8.8 MG/5ML syrup 10 mL  10 mL Per Tube QHS PRN Tukov-Yual, Magdalene S, NP   10 mL at 05/14/21 2105   sodium chloride 0.9 % primer fluid for CRRT   CRRT PRN Liana Gerold, MD   Given at 05/14/21 1657   sodium chloride flush (NS) 0.9 % injection 10-40 mL  10-40 mL Intracatheter Q12H Kathlyn Sacramento A, MD   30 mL at 05/15/21 0939   sodium chloride flush (NS) 0.9 % injection 10-40 mL  10-40 mL Intracatheter PRN Wellington Hampshire, MD       zinc sulfate capsule 220 mg  220 mg Per Tube Daily Rust-Chester, Britton L, NP   220 mg at 05/15/21 0939  .  Medications Prior to Admission  Medication Sig Dispense Refill   albuterol (PROVENTIL) (2.5 MG/3ML) 0.083% nebulizer solution Take 3 mLs (2.5 mg total) by nebulization every 6 (six) hours as needed for wheezing or shortness of breath. 150 mL 1   aspirin 81 MG tablet Take 1 tablet (81 mg total) by mouth daily. 30 tablet 0   atorvastatin (LIPITOR) 40 MG tablet Take 1 tablet (40 mg total) by mouth daily. 90 tablet 1   azelastine (ASTELIN) 0.1 % nasal spray Place 2 sprays into both nostrils 2 (two) times daily as needed for rhinitis or allergies. 30 mL 1   baclofen (LIORESAL) 10 MG tablet Take 10 mg by mouth daily.     celecoxib (CELEBREX) 200 MG capsule Take 200 mg by mouth daily.     Cetirizine HCl (ZYRTEC ALLERGY) 10 MG TBDP Take 10 mg by mouth at bedtime. For sinus symptoms and seasonal allergies 30 tablet 1   docusate sodium (COLACE) 100 MG capsule Take 100 mg by mouth daily.     fluticasone (FLONASE) 50 MCG/ACT nasal spray       glucose blood (ACCU-CHEK AVIVA PLUS) test strip Use as instructed 100 each 12   Lancets (ACCU-CHEK SOFT TOUCH) lancets Use as instructed 100 each 12   meloxicam (MOBIC) 15 MG tablet Take 15 mg by mouth daily.     metFORMIN (GLUCOPHAGE-XR) 750 MG 24 hr tablet Take 1 tablet (750 mg total) by mouth daily with breakfast. 90 tablet 1   montelukast (SINGULAIR) 10 MG tablet Take 1 tablet (10 mg total) by mouth at bedtime. 90 tablet 1   Multiple Vitamins-Minerals (MULTIVITAL PO) Take 1 tablet by mouth daily.     omeprazole (PRILOSEC) 20 MG capsule Take 1 capsule (20 mg total) by mouth daily. 90 capsule 1   ORTHOVISC 30 MG/2ML SOSY      triamterene-hydrochlorothiazide (DYAZIDE) 37.5-25 MG capsule TAKE 1 CAPSULE EVERY DAY 90 capsule 1   vitamin B-12 (CYANOCOBALAMIN) 100 MCG tablet Take 100 mcg by mouth daily.     calcium carbonate (OSCAL) 1500 (600 Ca) MG TABS tablet Take 600 mg of elemental calcium by mouth 2 (two) times daily with a meal. (Patient not taking: Reported on 05/05/2021)     Fluticasone-Umeclidin-Vilant (TRELEGY ELLIPTA) 100-62.5-25 MCG/INH AEPB Inhale 1 puff into the lungs daily. In place of Stiolto 180 each 1   loratadine (CLARITIN) 10 MG tablet Take 1 tablet (10 mg total) by mouth daily. 90 tablet 1    Allergies: No Known Allergies  PMH:  Past Medical History:  Diagnosis Date   AKI (acute kidney injury) ([place])    a. 04/2021 in setting of bacteremia/shock.   Arthritis    Asthma  Bacteremia    a. 04/2021 S pyogenes bacteremia in setting of lower ext cellulitis.   COPD (chronic obstructive pulmonary disease) ([facility])    Diabetes mellitus without complication ([facility])    Endometriosis    GERD (gastroesophageal reflux disease)    History of echocardiogram    a. 07/2013 Echo: EF 55-60%, impaired relaxation, mild TR; b. 04/2021 Echo: EF 50-55%, mild LVH, nl RV fxn, mild BAE, Ao sclerosis w/o stenosis.   Hypertension    Obesity    PAF (paroxysmal atrial fibrillation) ([place])    a. 04/2021 in  setting of septic shock/cellulitis.   Sleep apnea    CPAP    Fam Hx:  Family History  Problem Relation Age of Onset   Congestive Heart Failure Mother    Coronary artery disease Father 59   Breast cancer Sister 50   Heart disease Brother    Varicose Veins Brother    Alcohol abuse Brother     Soc Hx:  Social History   Socioeconomic History   Marital status: Married    Spouse name: Trilby Drummer   Number of children: 1   Years of education: 12   Highest education level: 12th grade  Occupational History    Employer: RETIRED  Tobacco Use   Smoking status: Former    Packs/day: 2.00    Years: 40.00    Pack years: 80.00    Types: Cigarettes    Quit date: 2003    Years since quitting: 20.0   Smokeless tobacco: Former    Types: Snuff    Quit date: 04/2001   Tobacco comments:    smoking cessation materials not required  Vaping Use   Vaping Use: Never used  Substance and Sexual Activity   Alcohol use: No    Alcohol/week: 0.0 standard drinks   Drug use: No   Sexual activity: Not Currently  Other Topics Concern   Not on file  Social History Narrative   Lives locally w/ husband.  Does not routinely exercise.   Social Determinants of Health   Financial Resource Strain: Medium Risk   Difficulty of Paying Living Expenses: Somewhat hard  Food Insecurity: No Food Insecurity   Worried About Charity fundraiser in the Last Year: Never true   Ran Out of Food in the Last Year: Never true  Transportation Needs: No Transportation Needs   Lack of Transportation (Medical): No   Lack of Transportation (Non-Medical): No  Physical Activity: Inactive   Days of Exercise per Week: 0 days   Minutes of Exercise per Session: 0 min  Stress: No Stress Concern Present   Feeling of Stress : Not at all  Social Connections: Moderately Integrated   Frequency of Communication with Friends and Family: More than three times a week   Frequency of Social Gatherings with Friends and Family: More than  three times a week   Attends Religious Services: More than 4 times per year   Active Member of [employer] or Organizations: No   Attends Archivist Meetings: Never   Marital Status: Married  Human resources officer Violence: Not At Risk   Fear of Current or Ex-Partner: No   Emotionally Abused: No   Physically Abused: No   Sexually Abused: No    PSH:  Past Surgical History:  Procedure Laterality Date   ABDOMINAL HYSTERECTOMY     APPLICATION OF WOUND VAC  05/10/2021   Procedure: APPLICATION OF WOUND VAC;  Surgeon: Herbert Pun, MD;  Location: [facility] ORS;  Service: General;;  COLONOSCOPY  10/29/2006   Dr Allen Norris   COLONOSCOPY WITH PROPOFOL N/A 11/05/2016   Procedure: COLONOSCOPY WITH PROPOFOL;  Surgeon: Robert Bellow, MD;  Location: [facility] ENDOSCOPY;  Service: Endoscopy;  Laterality: N/A;   INCISION AND DRAINAGE OF WOUND Left 05/10/2021   Procedure: IRRIGATION AND DEBRIDEMENT LEFT LEG;  Surgeon: Herbert Pun, MD;  Location: [facility] ORS;  Service: General;  Laterality: Left;   NASAL SINUS SURGERY  2002   Dr Carlis Abbott   WOUND DEBRIDEMENT Left 05/07/2021   Procedure: DEBRIDEMENT WOUND;  Surgeon: Herbert Pun, MD;  Location: [facility] ORS;  Service: General;  Laterality: Left;  . Procedures since admission: No admission procedures for hospital encounter.  ROS: Review of systems normal other than 12 systems except per HPI.  PHYSICAL EXAM  Vitals: Blood pressure (!) 114/42, pulse (!) 137, temperature (!) 97 F (36.1 C), resp. rate (!) 30, height _0  (1.651 m), weight 128.4 kg, SpO2 100 %.. General: Well-developed, Well-nourished on vent and sedated Mood: Unable to assess. Patient on vent and sedated Orientation: Patient on vent and sedated. Vocal Quality: Unable to assess. Patient intubated, on vent and sedated Head and Face: NCAT. No facial asymmetry. No visible skin lesions. No significant facial scars. No tenderness with sinus percussion. Facial strength normal and  symmetric. Ears: External ears with normal landmarks, no lesions. External auditory canals free of infection, cerumen impaction or lesions. Tympanic membranes intact with good landmarks and normal mobility on pneumatic otoscopy. No middle ear effusion. Hearing: Unable to assess. Patient on vent and sedated Nose: External nose normal with midline dorsum and no lesions or deformity. Nasal Cavity reveals essentially midline septum with normal inferior turbinates. No significant mucosal congestion or erythema. Nasal secretions are minimal and clear. No polyps seen on anterior rhinoscopy. Oral Cavity/ Oropharynx: Lips are mildly edematous.  Lips are cracked and dry and oral cavity shows dry mucous membranes, some edema of the tongue and areas on the sides of the tongue where she has apparently bitten the tongue though no large lacerations are seen and no active bleeding.  No gross dental caries are seen though exam is limited due to presence of endotracheal tube.  Posterior pharynx cannot be visualized. Indirect Laryngoscopy/Nasopharyngoscopy: Visualization of the larynx, hypopharynx and nasopharynx is not possible in this setting with routine examination. Neck: Supple and symmetric with no palpable masses, tenderness or crepitance. The trachea is midline. Thyroid gland is soft, nontender and symmetric with no masses or enlargement. Parotid and submandibular glands are soft, nontender and symmetric, without masses. Lymphatic: Cervical lymph nodes are without palpable lymphadenopathy or tenderness. Respiratory: Patient on ventilator for respiratory support Cardiovascular: Carotid pulse shows regular rate and rhythm Neurologic: Unable to assess. Patient on vent and sedated Eyes: Unable to assess. Patient on vent and sedated  MEDICAL DECISION MAKING: Data Review:  Results for orders placed or performed during the hospital encounter of 05/04/21 (from the past 48 hour(s))  Glucose, capillary     Status:  Abnormal   Collection Time: 05/13/21  7:57 PM  Result Value Ref Range   Glucose-Capillary 152 (H) 70 - 99 mg/dL    Comment: Glucose reference range applies only to samples taken after fasting for at least 8 hours.   Comment 1 Document in Chart   Glucose, capillary     Status: Abnormal   Collection Time: 05/13/21 11:22 PM  Result Value Ref Range   Glucose-Capillary 172 (H) 70 - 99 mg/dL    Comment: Glucose reference range applies only to samples  taken after fasting for at least 8 hours.  Glucose, capillary     Status: Abnormal   Collection Time: 05/14/21  3:30 AM  Result Value Ref Range   Glucose-Capillary 191 (H) 70 - 99 mg/dL    Comment: Glucose reference range applies only to samples taken after fasting for at least 8 hours.  Blood gas, arterial     Status: Abnormal   Collection Time: 05/14/21  3:41 AM  Result Value Ref Range   FIO2 0.50    Delivery systems VENTILATOR    Mode PRESSURE REGULATED VOLUME CONTROL    VT 460 mL   Peep/cpap 5.0 cm H20   pH, Arterial 7.40 7.350 - 7.450   pCO2 arterial 45 32.0 - 48.0 mmHg   pO2, Arterial 129 (H) 83.0 - 108.0 mmHg   Bicarbonate 27.9 20.0 - 28.0 mmol/L   Acid-Base Excess 2.8 (H) 0.0 - 2.0 mmol/L   O2 Saturation 98.9 %   Patient temperature 37.0    Collection site RIGHT RADIAL    Sample type ARTERIAL DRAW    Allens test (pass/fail) PASS PASS   Mechanical Rate 20     Comment: Performed at [facility], [address] [place], [place] [address]  Renal function panel (daily at 0500)     Status: Abnormal   Collection Time: 05/14/21  4:10 AM  Result Value Ref Range   Sodium 133 (L) 135 - 145 mmol/L   Potassium 3.6 3.5 - 5.1 mmol/L   Chloride 98 98 - 111 mmol/L   CO2 26 22 - 32 mmol/L   Glucose, Bld 193 (H) 70 - 99 mg/dL    Comment: Glucose reference range applies only to samples taken after fasting for at least 8 hours.   BUN 33 (H) 8 - 23 mg/dL   Creatinine, Ser 1.45 (H) 0.44 - 1.00 mg/dL   Calcium 9.1 8.9 - 10.3 mg/dL    Phosphorus 2.3 (L) 2.5 - 4.6 mg/dL   Albumin 3.0 (L) 3.5 - 5.0 g/dL   GFR, Estimated 38 (L) >60 mL/min    Comment: (NOTE) Calculated using the CKD-EPI Creatinine Equation (2021)    Anion gap 9 5 - 15    Comment: Performed at [facility], [address] [place], [place] [address]  Magnesium     Status: None   Collection Time: 05/14/21  4:10 AM  Result Value Ref Range   Magnesium 1.9 1.7 - 2.4 mg/dL    Comment: Performed at [facility], [address] [place], [place] [address]  Procalcitonin     Status: None   Collection Time: 05/14/21  4:10 AM  Result Value Ref Range   Procalcitonin 21.12 ng/mL    Comment:        Interpretation: PCT >= 10 ng/mL: Important systemic inflammatory response, almost exclusively due to severe bacterial sepsis or septic shock. (NOTE)       Sepsis PCT Algorithm           Lower Respiratory Tract                                      Infection PCT Algorithm    ----------------------------     ----------------------------         PCT < 0.25 ng/mL                PCT < 0.10 ng/mL  Strongly encourage             Strongly discourage   discontinuation of antibiotics    initiation of antibiotics    ----------------------------     -----------------------------       PCT 0.25 - 0.50 ng/mL            PCT 0.10 - 0.25 ng/mL               OR       >80% decrease in PCT            Discourage initiation of                                            antibiotics      Encourage discontinuation           of antibiotics    ----------------------------     -----------------------------         PCT >= 0.50 ng/mL              PCT 0.26 - 0.50 ng/mL                AND       <80% decrease in PCT             Encourage initiation of                                             antibiotics       Encourage continuation           of antibiotics    ----------------------------     -----------------------------        PCT >= 0.50 ng/mL                   PCT > 0.50 ng/mL               AND         increase in PCT                  Strongly encourage                                      initiation of antibiotics    Strongly encourage escalation           of antibiotics                                     -----------------------------                                           PCT <= 0.25 ng/mL                                                 OR                                        >  80% decrease in PCT                                      Discontinue / Do not initiate                                             antibiotics  Performed at [facility], [address] [place], [place] [address]   T4, free     Status: Abnormal   Collection Time: 05/14/21  4:10 AM  Result Value Ref Range   Free T4 0.48 (L) 0.61 - 1.12 ng/dL    Comment: (NOTE) Biotin ingestion may interfere with free T4 tests. If the results are inconsistent with the TSH level, previous test results, or the clinical presentation, then consider biotin interference. If needed, order repeat testing after stopping biotin. Performed at [facility], [address] [place], [place] [address]   T3     Status: Abnormal   Collection Time: 05/14/21  4:10 AM  Result Value Ref Range   T3, Total 50 (L) 71 - 180 ng/dL    Comment: (NOTE) Performed At: [facility] [address] [place], [place] [id number] Jolene Schimke MD [id number]   CBC     Status: Abnormal   Collection Time: 05/14/21  4:10 AM  Result Value Ref Range   WBC 28.4 (H) 4.0 - 10.5 K/uL   RBC 2.37 (L) 3.87 - 5.11 MIL/uL   Hemoglobin 6.7 (L) 12.0 - 15.0 g/dL   HCT 69.0 (L) 74.2 - 50.4 %   MCV 86.5 80.0 - 100.0 fL   MCH 28.3 26.0 - 34.0 pg   MCHC 32.7 30.0 - 36.0 g/dL   RDW 30.4 (H) 39.3 - 53.8 %   Platelets 121 (L) 150 - 400 K/uL   nRBC 14.0 (H) 0.0 - 0.2 %    Comment: Performed at [facility], [address] [place], [place] [address]  Prepare RBC (crossmatch)      Status: None   Collection Time: 05/14/21  5:30 AM  Result Value Ref Range   Order Confirmation      ORDER PROCESSED BY BLOOD BANK Performed at [facility], [address] [place], [place] [address]   Glucose, capillary     Status: Abnormal   Collection Time: 05/14/21  7:08 AM  Result Value Ref Range   Glucose-Capillary 177 (H) 70 - 99 mg/dL    Comment: Glucose reference range applies only to samples taken after fasting for at least 8 hours.  Hemoglobin and hematocrit, blood     Status: Abnormal   Collection Time: 05/14/21 10:56 AM  Result Value Ref Range   Hemoglobin 8.0 (L) 12.0 - 15.0 g/dL   HCT 28.9 (L) 03.7 - 40.2 %    Comment: Performed at [facility], [address] [place], [place] [address]  Protime-INR     Status: Abnormal   Collection Time: 05/14/21 10:56 AM  Result Value Ref Range   Prothrombin Time 21.0 (H) 11.4 - 15.2 seconds   INR 1.8 (H) 0.8 - 1.2    Comment: (NOTE) INR goal varies based on device and disease states. Performed at [facility], [address] [place], [place] [address]   APTT     Status: Abnormal  Collection Time: 05/14/21 10:56 AM  Result Value Ref Range   aPTT 45 (H) 24 - 36 seconds    Comment:        IF BASELINE aPTT IS ELEVATED, SUGGEST PATIENT RISK ASSESSMENT BE USED TO DETERMINE APPROPRIATE ANTICOAGULANT THERAPY. Performed at [facility], [address] [place], [place] [address]   Glucose, capillary     Status: Abnormal   Collection Time: 05/14/21 11:31 AM  Result Value Ref Range   Glucose-Capillary 218 (H) 70 - 99 mg/dL    Comment: Glucose reference range applies only to samples taken after fasting for at least 8 hours.  Glucose, capillary     Status: Abnormal   Collection Time: 05/14/21  3:16 PM  Result Value Ref Range   Glucose-Capillary 246 (H) 70 - 99 mg/dL    Comment: Glucose reference range applies only to samples taken after fasting for at least 8 hours.  Renal function  panel (daily at 1600)     Status: Abnormal   Collection Time: 05/14/21  3:55 PM  Result Value Ref Range   Sodium 129 (L) 135 - 145 mmol/L   Potassium 3.9 3.5 - 5.1 mmol/L   Chloride 96 (L) 98 - 111 mmol/L   CO2 23 22 - 32 mmol/L   Glucose, Bld 275 (H) 70 - 99 mg/dL    Comment: Glucose reference range applies only to samples taken after fasting for at least 8 hours.   BUN 39 (H) 8 - 23 mg/dL   Creatinine, Ser 1.75 (H) 0.44 - 1.00 mg/dL   Calcium 9.2 8.9 - 10.3 mg/dL   Phosphorus 2.6 2.5 - 4.6 mg/dL   Albumin 2.9 (L) 3.5 - 5.0 g/dL   GFR, Estimated 31 (L) >60 mL/min    Comment: (NOTE) Calculated using the CKD-EPI Creatinine Equation (2021)    Anion gap 10 5 - 15    Comment: Performed at [facility], [address] [place], [place] [address]  Glucose, capillary     Status: Abnormal   Collection Time: 05/14/21  7:25 PM  Result Value Ref Range   Glucose-Capillary 232 (H) 70 - 99 mg/dL    Comment: Glucose reference range applies only to samples taken after fasting for at least 8 hours.   Comment 1 Notify RN    Comment 2 Document in Chart   Glucose, capillary     Status: Abnormal   Collection Time: 05/14/21 11:28 PM  Result Value Ref Range   Glucose-Capillary 209 (H) 70 - 99 mg/dL    Comment: Glucose reference range applies only to samples taken after fasting for at least 8 hours.  Renal function panel (daily at 0500)     Status: Abnormal   Collection Time: 05/15/21  3:13 AM  Result Value Ref Range   Sodium 131 (L) 135 - 145 mmol/L   Potassium 3.8 3.5 - 5.1 mmol/L   Chloride 98 98 - 111 mmol/L   CO2 24 22 - 32 mmol/L   Glucose, Bld 228 (H) 70 - 99 mg/dL    Comment: Glucose reference range applies only to samples taken after fasting for at least 8 hours.   BUN 48 (H) 8 - 23 mg/dL   Creatinine, Ser 1.66 (H) 0.44 - 1.00 mg/dL   Calcium 9.2 8.9 - 10.3 mg/dL   Phosphorus 2.3 (L) 2.5 - 4.6 mg/dL   Albumin 2.6 (L) 3.5 - 5.0 g/dL   GFR, Estimated 33 (L) >60 mL/min     Comment: (NOTE) Calculated using  the CKD-EPI Creatinine Equation (2021)    Anion gap 9 5 - 15    Comment: Performed at [facility], [address] [place], [place] [address]  Magnesium     Status: None   Collection Time: 05/15/21  3:13 AM  Result Value Ref Range   Magnesium 1.9 1.7 - 2.4 mg/dL    Comment: Performed at [facility], [address] [place], [place] [address]  CBC with Differential/Platelet     Status: Abnormal   Collection Time: 05/15/21  3:13 AM  Result Value Ref Range   WBC 24.5 (H) 4.0 - 10.5 K/uL   RBC 3.01 (L) 3.87 - 5.11 MIL/uL   Hemoglobin 8.7 (L) 12.0 - 15.0 g/dL   HCT 26.5 (L) 36.0 - 46.0 %   MCV 88.0 80.0 - 100.0 fL   MCH 28.9 26.0 - 34.0 pg   MCHC 32.8 30.0 - 36.0 g/dL   RDW 19.8 (H) 11.5 - 15.5 %   Platelets 122 (L) 150 - 400 K/uL   nRBC 15.5 (H) 0.0 - 0.2 %   Neutrophils Relative % 93 %   Neutro Abs 22.7 (H) 1.7 - 7.7 K/uL   Lymphocytes Relative 3 %   Lymphs Abs 0.7 0.7 - 4.0 K/uL   Monocytes Relative 3 %   Monocytes Absolute 0.6 0.1 - 1.0 K/uL   Eosinophils Relative 0 %   Eosinophils Absolute 0.0 0.0 - 0.5 K/uL   Basophils Relative 0 %   Basophils Absolute 0.1 0.0 - 0.1 K/uL   WBC Morphology MILD LEFT SHIFT (1-5% METAS, OCC MYELO, OCC BANDS)    Smear Review MORPHOLOGY UNREMARKABLE    Immature Granulocytes 1 %   Abs Immature Granulocytes 0.33 (H) 0.00 - 0.07 K/uL   Polychromasia PRESENT     Comment: Performed at [facility], [address] [place], [place] [address]  Hepatic function panel     Status: Abnormal   Collection Time: 05/15/21  3:13 AM  Result Value Ref Range   Total Protein 6.6 6.5 - 8.1 g/dL   Albumin 2.6 (L) 3.5 - 5.0 g/dL   AST 639 (H) 15 - 41 U/L   ALT 749 (H) 0 - 44 U/L   Alkaline Phosphatase 328 (H) 38 - 126 U/L   Total Bilirubin 5.0 (H) 0.3 - 1.2 mg/dL   Bilirubin, Direct 3.5 (H) 0.0 - 0.2 mg/dL   Indirect Bilirubin 1.5 (H) 0.3 - 0.9 mg/dL    Comment: Performed at [facility],  [address] [place], [place] [address]  Triglycerides     Status: None   Collection Time: 05/15/21  3:13 AM  Result Value Ref Range   Triglycerides 121 <150 mg/dL    Comment: Performed at [facility], [address] [place], [place] [address]  Glucose, capillary     Status: Abnormal   Collection Time: 05/15/21  3:55 AM  Result Value Ref Range   Glucose-Capillary 201 (H) 70 - 99 mg/dL    Comment: Glucose reference range applies only to samples taken after fasting for at least 8 hours.  Glucose, capillary     Status: Abnormal   Collection Time: 05/15/21  7:31 AM  Result Value Ref Range   Glucose-Capillary 190 (H) 70 - 99 mg/dL    Comment: Glucose reference range applies only to samples taken after fasting for at least 8 hours.  Glucose, capillary     Status: Abnormal   Collection Time: 05/15/21 11:08 AM  Result Value Ref  Range   Glucose-Capillary 213 (H) 70 - 99 mg/dL    Comment: Glucose reference range applies only to samples taken after fasting for at least 8 hours.  Glucose, capillary     Status: Abnormal   Collection Time: 05/15/21  4:17 PM  Result Value Ref Range   Glucose-Capillary 242 (H) 70 - 99 mg/dL    Comment: Glucose reference range applies only to samples taken after fasting for at least 8 hours.  . DG Chest 1 View  Result Date: 05/15/2021 CLINICAL DATA:  71 year old female with respiratory failure. Sepsis. EXAM: CHEST  1 VIEW COMPARISON:  Portable chest 05/11/2021 and earlier. FINDINGS: Portable AP supine view at 0609 hours. The patient remains mildly rotated to the right. Endotracheal tube tip in good position between the clavicles and carina. Stable left IJ approach dual lumen vascular catheter. Enteric tube and pH probe in place and appears stable. Right IJ central line tip now projects at the level the right atrium although lung volumes are slightly lower. Stable cardiac size and mediastinal contours. No pneumothorax. No pleural effusion or consolidation.  Increased pulmonary vascularity since 05/10/2021. Paucity of bowel gas. No acute osseous abnormality identified. IMPRESSION: 1. Essentially stable lines and tubes. 2. Lower lung volumes with increased pulmonary vascularity. Consider mild or developing interstitial edema. No pleural effusion or consolidation. Electronically Signed   By: Genevie Ann M.D.   On: 05/15/2021 06:46   DG Abd 1 View  Result Date: 05/15/2021 CLINICAL DATA:  71 year old female with respiratory failure. Sepsis. EXAM: ABDOMEN - 1 VIEW COMPARISON:  05/11/2021. FINDINGS: Portable AP supine views at 0603 hours. Enteric tube terminates in the gastric body. Side hole appears to be inside the stomach. There is mild to moderate gastric distention with air. A small volume of fundal oral contrast persists. Paucity of bowel gas elsewhere in the visible abdomen and pelvis. Left hemipelvis phlebolith. No acute osseous abnormality identified. IMPRESSION: 1. Satisfactory enteric tube termination in the stomach. Mild to moderate gastric distention with air. Small volume oral contrast suspected in the gastric fundus. 2. Paucity of bowel gas elsewhere in the abdomen and pelvis. Electronically Signed   By: Genevie Ann M.D.   On: 05/15/2021 06:47   CT HEAD WO CONTRAST (5MM)  Addendum Date: 05/14/2021   ADDENDUM REPORT: 05/14/2021 16:47 ADDENDUM: These results will be called to the ordering clinician or representative by the Radiologist Assistant, and communication documented in the PACS or [employer]. Electronically Signed   By: Franchot Gallo M.D.   On: 05/14/2021 16:47   Result Date: 05/14/2021 CLINICAL DATA:  Subarachnoid hemorrhage.  Sepsis. EXAM: CT HEAD WITHOUT CONTRAST TECHNIQUE: Contiguous axial images were obtained from the base of the skull through the vertex without intravenous contrast. RADIATION DOSE REDUCTION: This exam was performed according to the departmental dose-optimization program which includes automated exposure control, adjustment  of the mA and/or kV according to patient size and/or use of iterative reconstruction technique. COMPARISON:  MRI head 05/14/2021 FINDINGS: Brain: Hyperdensity in the left middle frontal sulcus. This corresponds to the FLAIR hyperintensity on MRI and is most consistent with small volume acute subarachnoid hemorrhage on the left. No other areas of high-density hemorrhage. Ventricle size normal.  Negative for acute infarct or mass. Empty sella with enlargement of the sella filled with CSF. Small pituitary. Vascular: Negative for hyperdense vessel Skull: Negative Sinuses/Orbits: Extensive mucosal edema paranasal sinuses. Probable air-fluid levels in the maxillary sinus bilaterally. Bilateral proptosis without orbital mass. Other: None IMPRESSION: Hyperintensity left middle frontal  sulcus compatible with acute subarachnoid hemorrhage, small volume. No other acute intracranial abnormality. No hydrocephalus or infarct. Extensive paranasal sinus mucosal disease with air-fluid levels. Electronically Signed: By: Franchot Gallo M.D. On: 05/14/2021 15:59   MR ANGIO HEAD WO CONTRAST  Result Date: 05/15/2021 CLINICAL DATA:  Subarachnoid hemorrhage EXAM: MRA HEAD WITHOUT CONTRAST TECHNIQUE: Angiographic images of the Circle of Willis were acquired using MRA technique without intravenous contrast. COMPARISON:  No prior MRA, correlation is made with MRI 05/14/2021 FINDINGS: Anterior circulation: Both internal carotid arteries are patent to the termini, without significant stenosis. A1 segments patent. Normal anterior communicating artery. Anterior cerebral arteries are patent to their distal aspects. No M1 stenosis or occlusion. Normal MCA bifurcations. Distal MCA branches perfused and symmetric. Posterior circulation: Vertebral arteries patent to the vertebrobasilar junction without stenosis. Basilar patent to its distal aspect. Superior cerebellar arteries patent bilaterally. Patent P1 segments. PCAs perfused to their distal  aspects without stenosis. The bilateral posterior communicating arteries are patent. Anatomic variants: None significant Other: None. IMPRESSION: No intracranial large vessel occlusion or significant stenosis. No aneurysm or other vascular abnormality. Electronically Signed   By: Merilyn Baba M.D.   On: 05/15/2021 02:43   MR BRAIN WO CONTRAST  Result Date: 05/14/2021 CLINICAL DATA:  Altered mental status, septic shock secondary to left lower extremity cellulitis EXAM: MRI HEAD WITHOUT CONTRAST TECHNIQUE: Multiplanar, multiecho pulse sequences of the brain and surrounding structures were obtained without intravenous contrast. COMPARISON:  Brain MRI 08/10/2013 FINDINGS: Brain: There is no evidence of acute infarct. There is sulcal FLAIR hyperintensity with trace SWI signal dropout overlying a left frontal lobe sulcus and the right superior frontal sulcus (15-35, 15-39). There is no other evidence of acute intracranial hemorrhage or extra-axial fluid collection. Parenchymal volume is normal. The ventricles are normal in size. Parenchymal signal is normal, with no significant burden of white matter microangiopathic change. A partially empty sella is noted. There is no mass lesion. There is no midline shift. Vascular: Normal flow voids. Skull and upper cervical spine: Normal marrow signal. Sinuses/Orbits: There is mucosal thickening throughout the paranasal sinuses with layering fluid in the maxillary sinuses and complete opacification of the mastoid air cells, likely at least partially related to intubation. The globes and orbits are unremarkable. Other: There is scalp fluid predominantly in the bilateral occipital regions. 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 IMPRESSION: 1. Sulcal FLAIR hyperintensity overlying bilateral frontal lobe sulci may be artifactual related to intubation; however, subarachnoid hemorrhage can have a similar appearance. Differential also includes meningitis. Recommend noncontrast  CT for further evaluation for subarachnoid hemorrhage. Consider correlation with lumbar puncture to evaluate for meningitis as indicated, if the CT head is negative for hemorrhage. 2. Fluid in the bilateral occipital scalp. These results were called by telephone at the time of interpretation on 05/14/2021 at 3:10 pm to provider DANA GRAVES NP, who verbally acknowledged these results. Electronically Signed   By: Valetta Mole M.D.   On: 05/14/2021 15:11   EEG adult  Result Date: 05/13/2021 Derek Jack, MD     05/13/2021  8:16 PM Routine EEG Report Cherika Jessie Hirt is a 71 y.o. female with a history of septic shock and encephalopathy who is undergoing an EEG to evaluate for seizures. Report: This EEG was acquired with electrodes placed according to the International 10-20 electrode system (including Fp1, Fp2, F3, F4, C3, C4, P3, P4, O1, O2, T3, T4, T5, T6, A1, A2, Fz, Cz, Pz). The following electrodes were missing or  displaced: none. The best background was continuous at 3-4 Hz. This activity is reactive to stimulation. No sleep architecture was identified. There were frequent triphasic waves that did not appear epileptiform. There was no focal slowing. There were no interictal epileptiform discharges. There were no electrographic seizures identified. Photic stimulation and hyperventilation were not performed. Impression and clinical correlation: This EEG was obtained while sedated on hydromorphone and is abnormal due to: - Severe diffuse slowing indicative of global cerebral dysfunction - Frequent triphasic waves indicative of metabolic encephalopathy Epileptiform abnormalities were not seen during this recording. Su Monks, MD [facility] [phone] If 7pm- 7am, please page neurology on call as listed in [place].  [name]   ASSESSMENT: Respiratory failure.  PLAN: Critical care consult involving over 31 minutes of consultation, chart review, examination, discussion with family, and care  coordination. Will schedule tracheostomy. Risks including bleeding, infection, scarring, recurrent laryngeal nerve injury discussed with patient's family and nursing staff will get a consent on the chart.  She is currently scheduled for surgery on Friday at noon.  In the meantime the primary service needs to reassess her coagulation status and try to correct if possible to minimize bleeding.  Platelet count is in an acceptable range but ideally we would have the INR improved.  Since anticoagulation has been discontinued I am assuming this is due to her liver failure. Will recheck INR in AM, but may need primary service to address this in order to safely proceed.    [name], MD 05/15/2021 5:37 PM

## 2021-05-15 NOTE — Progress Notes (Signed)
Inpatient Diabetes Program Recommendations  AACE/ADA: New Consensus Statement on Inpatient Glycemic Control (2015)  Target Ranges:  Prepandial:   less than 140 mg/dL      Peak postprandial:   less than 180 mg/dL (1-2 hours)      Critically ill patients:  140 - 180 mg/dL    Latest Reference Range & Units 05/14/21 23:28 05/15/21 03:55 05/15/21 07:31  Glucose-Capillary 70 - 99 mg/dL 209 (H)  3 units Novolog  201 (H)  3 units Novolog  190 (H)  2 units Novolog   (H): Data is abnormally high   Home DM Meds: Metformin 750 mg daily  Current Orders: Levemir 38 units BID Novolog 1-2-3 Q4H    Getting Solucortef 100 mg Q8H    MD- Note pt getting tube feeds (goal rate 50cc/hr).    CBGs now >200  Once Tube Feeds reach goal rate today, please consider adding Novolog Tube Feed Coverage:  Novolog 3 units Q4 hours HOLD if tube fees HELD for any reason    --Will follow patient during hospitalization--  Wyn Quaker RN, MSN, CDE Diabetes Coordinator Inpatient Glycemic Control Team Team Pager: [phone] (8a-5p)

## 2021-05-15 NOTE — Progress Notes (Signed)
Patient ID: Alexandra Foster, female   DOB: 18-Feb-1951, 72 y.o.   MRN: [id number]     [place] [facility] Day(s): 11.   Interval History: Patient seen and examined, no acute events or new complaints overnight. As per discussed with primary team, small improvement. Now discontinue vasopressors.   Vital signs in last 24 hours: [min-max] current  Temp:  [96.4 F (35.8 C)-99.5 F (37.5 C)] 97.3 F (36.3 C) (02/01 1045) Pulse Rate:  [30-89] 78 (02/01 1045) Resp:  [18-26] 20 (02/01 1045) BP: (95-150)/(42-72) 150/55 (02/01 1045) SpO2:  [97 %-100 %] 100 % (02/01 1045) FiO2 (%):  [35 %-40 %] 35 % (02/01 1132) Weight:  [128.4 kg] 128.4 kg (02/01 0452)     Height: 5\' 5"  (165.1 cm) Weight: 128.4 kg BMI (Calculated): 47.11   Physical Exam:  Constitutional: alert, cooperative and no distress  Extremity: minimal granulation tissue of left leg muscular tissue. Small patch of ischemia of skin at the dorsal aspect of left foot (not increasing in size).        Labs:  CBC Latest Ref Rng & Units 05/15/2021 05/14/2021 05/14/2021  WBC 4.0 - 10.5 K/uL 24.5(H) - 28.4(H)  Hemoglobin 12.0 - 15.0 g/dL 8.7(L) 8.0(L) 6.7(L)  Hematocrit 36.0 - 46.0 % 26.5(L) 23.5(L) 20.5(L)  Platelets 150 - 400 K/uL 122(L) - 121(L)   CMP Latest Ref Rng & Units 05/15/2021 05/14/2021 05/14/2021  Glucose 70 - 99 mg/dL 228(H) 275(H) 193(H)  BUN 8 - 23 mg/dL 48(H) 39(H) 33(H)  Creatinine 0.44 - 1.00 mg/dL 1.66(H) 1.75(H) 1.45(H)  Sodium 135 - 145 mmol/L 131(L) 129(L) 133(L)  Potassium 3.5 - 5.1 mmol/L 3.8 3.9 3.6  Chloride 98 - 111 mmol/L 98 96(L) 98  CO2 22 - 32 mmol/L 24 23 26   Calcium 8.9 - 10.3 mg/dL 9.2 9.2 9.1  Total Protein 6.5 - 8.1 g/dL 6.6 - -  Total Bilirubin 0.3 - 1.2 mg/dL 5.0(H) - -  Alkaline Phos 38 - 126 U/L 328(H) - -  AST 15 - 41 U/L 639(H) - -  ALT 0 - 44 U/L 749(H) - -    Imaging studies: No new pertinent imaging studies   Assessment/Plan:  71 y.o. female with left leg necrotizing  fasciitis with 5 Day Post-Op s/p debridement, complicated by pertinent comorbidities including strep pyogenes bacteremia now in septic shock, respiratory failure mechanical ventilation, liver failure, acute renal failure on CRRT, COPD, type 2 diabetes mellitus, hypertension, obstructive sleep apnea, GERD   Left leg necrotizing fasciitis -S/p debridement 05/07/2021 -Second debridement on 05/10/2021.  Application of wound VAC system -I personally changed the negative pressure dressing with Wound VAC (DME). Wound still measure 40 x 30 cm.  -There has been slow improvement.  Now without vasopressors.  -I will continue to follow closely.  Arnold Long, MD

## 2021-05-15 NOTE — Progress Notes (Signed)
NAME:  Alexandra Foster, MRN:  [id number], DOB:  [birth year], LOS: 52 ADMISSION DATE:  05/04/2021  BRIEF SYNOPSIS 71 y.o female with medical history significant of Asthma/COPD, T2DM, HTN, OSA on CPAP, GERD, COVID-19 pneumonia, and Bilateral lower extremity edema who presented to the ED with chief complaint of LLE pain and chills since Thursday.  Patient was given 30 cc/kg of fluids and started on broad-spectrum antibiotics for sepsis with septic shock.   Admitted to the ICU with severe sepsis with shock secondary to cellulitis of LLE septic shock with LLL necrotizing fascitis post op resp failure and severe septic shock with progressive renal failure on CRRT and progressive multiorgan failure  MRI/CT SHOWS SMALL SAH  Past Medical History     Arthritis   Asthma   COPD (chronic obstructive pulmonary disease) ([place])   Diabetes mellitus without complication ([place])   Endometriosis   GERD (gastroesophageal reflux disease)   Hypertension   Obesity   Sleep apnea    CPAP    Significant Hospital Events   05/04/21: Admitted to the ICU with severe sepsis with shock secondary to cellulitis of LLE 1/23 off pressors +Afib with RVR 1/24 blood pressure low  1/24 to OR for fasciotomy 1/25 post op resp failure with severe septic shock 1/26 remains on pressors 1/27 returns to the OR today for debridement, wound VAC 1/28 overnight with hemodynamic deterioration, required reintubation, currently on pressors 1/29 remains on 3 pressors, CRRT, wound VAC and minimal sedation.  Awakens to voice and touch.  Seen by vascular surgery and awaiting input from general surgery regarding amputation of left leg 1/30 remains on vent remains on pressors 1/31 remains on pressors, on vent, on CRRT 2/1 remains on pressors, on CRRT, MRI/CT shows [place]   Significant Diagnostic Tests:  1/21: Chest Xray> no active cardiopulmonary process 1/21: CT left lower leg>Diffuse subcutaneous edema may represent cellulitis. No  drainable fluid collection/abscess 1/21: Ultrasound lower unilateral left> no DVT   Micro Data:  1/21: SARS-CoV-2 PCR> negative 1/21: Influenza PCR> negative 1/21: Blood culture x2> Streptococcus pyogenes 1/21: Urine Culture> negative 1/21: MRSA PCR>> negative   Antimicrobials:  Vancomycin 1/21> off Cefepime 1/21> off Ceftriaxone 1/21 >> off Penicilli G 1/24>> Linezolid 1/25>>  *Antimicrobials per ID    Interim History / Subjective:  Remains critically ill Patient with acute necrotizing fasciitis with emergent fasciotomy remains on pressors Wound vac in place to be changed today per surgery Returned to the OR 1/27 progressive hemodynamic instability after OR Remains on CCRT  Vent Mode: PRVC FiO2 (%):  [40 %-50 %] 40 % Set Rate:  [20 bmp] 20 bmp Vt Set:  [460 mL] 460 mL PEEP:  [5 cmH20] 5 cmH20 Pressure Support:  [5 cmH20] 5 cmH20     Objective   Blood pressure (!) 128/50, pulse 70, temperature (!) 96.8 F (36 C), resp. rate (!) 22, height _0  (1.651 m), weight 128.4 kg, SpO2 100 %.    Vent Mode: PRVC FiO2 (%):  [40 %-50 %] 40 % Set Rate:  [20 bmp] 20 bmp Vt Set:  [460 mL] 460 mL PEEP:  [5 cmH20] 5 cmH20 Pressure Support:  [5 cmH20] 5 cmH20   Intake/Output Summary (Last 24 hours) at 05/15/2021 0740 Last data filed at 05/15/2021 0700 Gross per 24 hour  Intake 2468.96 ml  Output 3140 ml  Net -671.04 ml    Filed Weights   05/13/21 0445 05/14/21 0433 05/15/21 0452  Weight: 130.7 kg 128.6 kg 128.4 kg  REVIEW OF SYSTEMS  PATIENT IS UNABLE TO PROVIDE COMPLETE REVIEW OF SYSTEMS DUE TO SEVERE CRITICAL ILLNESS AND TOXIC METABOLIC ENCEPHALOPATHY   PHYSICAL EXAMINATION:  GENERAL:critically ill appearing, +resp distress EYES: Pupils equal, round, reactive to light.  No scleral icterus.  MOUTH: Moist mucosal membrane. INTUBATED NECK: Supple.  PULMONARY: +rhonchi, +wheezing CARDIOVASCULAR: S1 and S2.  No murmurs  GASTROINTESTINAL: Soft, nontender,  -distended. Positive bowel sounds.  MUSCULOSKELETAL: No swelling, clubbing, or edema.  NEUROLOGIC: obtunded MUSCULOSKELETAL: Left lower extremity with wound VAC in place, surgery redressing.  Status post fasciotomy   Assessment & Plan   Admitted to the ICU with severe sepsis with shock secondary to cellulitis of LLE septic shock with LLL necrotizing fascitis post op resp failure and severe septic shock with progressive renal failure on CRRT and progressive multiorgan failure   Severe ACUTE Hypoxic and Hypercapnic Respiratory Failure -continue Mechanical Ventilator support -Wean Fio2 and PEEP as tolerated -VAP/VENT bundle implementation - Wean PEEP & FiO2 as tolerated, maintain SpO2 > 88% - Head of bed elevated 30 degrees, VAP protocol in place - Plateau pressures less than 30 cm H20  - Intermittent chest x-ray & ABG PRN - Ensure adequate pulmonary hygiene  -will perform SAT/SBT when respiratory parameters are met  Acute Hypoxic Respiratory Failure in the setting of worsening septic shock PMHx: COPD Refractory Septic Shock secondary to LLE necrotizing fasciitis Streptococcal toxic shock syndrome  ACUTE KIDNEY INJURY/Renal Failure -continue Foley Catheter-assess need -Avoid nephrotoxic agents -Follow urine output, BMP -Ensure adequate renal perfusion, optimize oxygenation -Renal dose medications Remains on CRRT  Intake/Output Summary (Last 24 hours) at 05/15/2021 0743 Last data filed at 05/15/2021 0700 Gross per 24 hour  Intake 2468.96 ml  Output 3140 ml  Net -671.04 ml     SEPTIC shock SOURCE-LLE Refractory Septic Shock secondary to LLE necrotizing fasciitis Streptococcal toxic shock syndrome -use vasopressors to keep MAP>65 as needed  stress dose steroids S/p IVIG Wound vac change today per surgery   Group A strep necrotizing fasciitis L LE Streptococcal toxic shock - Continue antibiotics: Penicillin G/linezolid - Status postdebridement, wound VAC per surgery -  Poor prognosis -Vascular and general surgery following S/p IVIG    ENDO - ICU hypoglycemic\Hyperglycemia protocol -check FSBS per protocol   GI GI PROPHYLAXIS as indicated  NUTRITIONAL STATUS DIET-->TF's as tolerated Constipation protocol as indicated   ELECTROLYTES -follow labs as needed -replace as needed -pharmacy consultation and following   Acute encephalopathy with questionable myoclonic seizure activity last night 05/11/21 DDx: Acute metabolic, query HIE given shock/massive resuscitation last PM -Toxic metabolic encephalopathy - Concern for toxemic/ischemic encephalopathy due to prolonged resuscitation - Loaded with keppra; continue at 1gram bid MRI/CT HEAD SHOWS SMALL VOLUME [place] APPRECIATE [place] -Neurology consulted    Best practice:  Diet: NPO Pain/Anxiety/Delirium protocol (if indicated): Yes, initiated VAP protocol (if indicated): Yes, initiated DVT prophylaxis: Subcutaneous Heparin GI prophylaxis: PPI Glucose control: ICU hyperglycemia protocol, insulin drip discontinued; SQ insulin Central venous access:  Rt IJV Arterial line:  N/A Foley:  Yes Mobility:  bed rest  PT consulted: N/A Last date of multidisciplinary goals of care discussion [1/27] Code Status:  full code Disposition: ICU    Labs   CBC: Recent Labs  Lab 05/11/21 0531 05/11/21 1239 05/12/21 1025 05/13/21 0311 05/13/21 1650 05/14/21 0410 05/14/21 1056 05/15/21 0313  WBC 42.8*  --  40.8* 34.8*  --  28.4*  --  24.5*  NEUTROABS 34.4*  --   --   --   --   --   --  22.7*  HGB 10.8*   < > 8.2* 7.3* 7.6* 6.7* 8.0* 8.7*  HCT 33.2*   < > 24.5* 22.2* 22.4* 20.5* 23.5* 26.5*  MCV 88.3  --  85.7 87.7  --  86.5  --  88.0  PLT 219  --  187 141*  --  121*  --  122*   < > = values in this interval not displayed.     Basic Metabolic Panel: Recent Labs  Lab 05/11/21 2052 05/12/21 0431 05/12/21 1025 05/12/21 1603 05/13/21 0311 05/13/21 1650 05/14/21 0410 05/14/21 1555  05/15/21 0313  NA 136   < > 132*   < > 131* 133* 133* 129* 131*  K 4.6   < > 4.4   < > 4.0 3.9 3.6 3.9 3.8  CL 95*   < > 94*   < > 98 97* 98 96* 98  CO2 25   < > 27   < > _0 GLUCOSE 123*   < > 155*   < > 159* 146* 193* 275* 228*  BUN 72*   < > 50*   < > 36* 31* 33* 39* 48*  CREATININE 3.28*   < > 2.29*   < > 1.77* 1.56* 1.45* 1.75* 1.66*  CALCIUM 6.7*   < > 7.6*   < > 8.1* 8.8* 9.1 9.2 9.2  MG 2.2  --  2.1  --  1.8  --  1.9  --  1.9  PHOS 8.4*   < > 5.8*   < > 3.4 2.8 2.3* 2.6 2.3*   < > = values in this interval not displayed.    GFR: Estimated Creatinine Clearance: 41.4 mL/min (A) (by C-G formula based on SCr of 1.66 mg/dL (H)). Recent Labs  Lab 05/12/21 1025 05/13/21 0311 05/14/21 0410 05/15/21 0313  PROCALCITON  --  20.94 21.12  --   WBC 40.8* 34.8* 28.4* 24.5*     Liver Function Tests: Recent Labs  Lab 05/11/21 0531 05/11/21 1600 05/12/21 0431 05/12/21 1025 05/12/21 1603 05/13/21 0311 05/13/21 1650 05/14/21 0410 05/14/21 1555 05/15/21 0313  AST 4,359*  --  6,519* 6,250*  --  4,114*  --   --   --  639*  ALT 1,746*  --  1,613* 1,711*  --  1,400*  --   --   --  749*  ALKPHOS 185*  --  232* 262*  --  266*  --   --   --  328*  BILITOT 2.5*  --  3.0* 3.9*  --  4.0*  --   --   --  5.0*  PROT 4.6*  --  5.3* 6.3*  --  5.9*  --   --   --  6.6  ALBUMIN <1.5*   < > 2.1*   2.0* 2.5*   < > 2.4*   2.4* 2.6* 3.0* 2.9* 2.6*   2.6*   < > = values in this interval not displayed.    Coagulation Profile: Recent Labs  Lab 05/14/21 1056  INR 1.8*     Cardiac Enzymes: Recent Labs  Lab 05/08/21 1313 05/12/21 1603  CKTOTAL 1,575* 4,670*     HbA1C: Hgb A1c MFr Bld  Date/Time Value Ref Range Status  05/05/2021 04:53 AM 6.7 (H) 4.8 - 5.6 % Final    Comment:    (NOTE)         Prediabetes: 5.7 - 6.4         Diabetes: >6.4  Glycemic control for adults with diabetes: <7.0   02/11/2021 09:41 AM 6.3 (H) <5.7 % of total Hgb Final    Comment:    For  someone without known diabetes, a hemoglobin  A1c value between 5.7% and 6.4% is consistent with prediabetes and should be confirmed with a  follow-up test. . For someone with known diabetes, a value <7% indicates that their diabetes is well controlled. A1c targets should be individualized based on duration of diabetes, age, comorbid conditions, and other considerations. . This assay result is consistent with an increased risk of diabetes. . Currently, no consensus exists regarding use of hemoglobin A1c for diagnosis of diabetes for children. .     CBG: Recent Labs  Lab 05/14/21 1516 05/14/21 1925 05/14/21 2328 05/15/21 0355 05/15/21 0731  GLUCAP 246* 232* 209* 201* 190*    No Known Allergies   Medications  Scheduled Meds:  vitamin C  500 mg Per Tube BID   budesonide (PULMICORT) nebulizer solution  0.25 mg Nebulization BID   chlorhexidine gluconate (MEDLINE KIT)  15 mL Mouth Rinse BID   Chlorhexidine Gluconate Cloth  6 each Topical Q0600   docusate  100 mg Per Tube BID   feeding supplement (PROSource TF)  90 mL Per Tube BID   free water  30 mL Per Tube Q4H   hydrocortisone sod succinate (SOLU-CORTEF) inj  100 mg Intravenous Q8H   insulin aspart  1-3 Units Subcutaneous Q4H   insulin detemir  38 Units Subcutaneous BID   ipratropium-albuterol  3 mL Nebulization Q6H   levETIRAcetam  1,000 mg Per Tube BID   linezolid  600 mg Per Tube Q12H   mouth rinse  15 mL Mouth Rinse 10 times per day   midodrine  10 mg Per Tube TID WC   multivitamin  1 tablet Per Tube QHS   nutrition supplement (JUVEN)  1 packet Per Tube BID BM   pantoprazole (PROTONIX) IV  40 mg Intravenous Q24H   polyethylene glycol  17 g Per Tube Daily   sodium chloride flush  10-40 mL Intracatheter Q12H   zinc sulfate  220 mg Per Tube Daily   Continuous Infusions:  sodium chloride Stopped (05/13/21 2154)   amiodarone 30 mg/hr (05/15/21 0659)   cefTRIAXone (ROCEPHIN)  IV Stopped (05/14/21 1644)   feeding  supplement (VITAL AF 1.2 CAL) 40 mL/hr at 05/15/21 0400   HYDROmorphone Stopped (05/14/21 0843)   norepinephrine (LEVOPHED) Adult infusion Stopped (05/12/21 1816)   phenylephrine (NEO-SYNEPHRINE) Adult infusion 5 mcg/min (05/15/21 0659)   prismasol BGK 2/2.5 dialysis solution 1,500 mL/hr at 05/14/21 2353   prismasol BGK 2/2.5 replacement solution 500 mL/hr at 05/14/21 1700   prismasol BGK 2/2.5 replacement solution 500 mL/hr at 05/14/21 1700   propofol (DIPRIVAN) infusion 13 mcg/kg/min (05/15/21 0659)   PRN Meds:.docusate sodium, fentaNYL (SUBLIMAZE) injection, heparin, HYDROmorphone, lip balm, midazolam, ondansetron (ZOFRAN) IV, oxyCODONE, sennosides, sodium chloride, sodium chloride flush      DVT/GI PRX  assessed I Assessed the need for Labs I Assessed the need for Foley I Assessed the need for Central Venous Line Family Discussion when available I Assessed the need for Mobilization I made an Assessment of medications to be adjusted accordingly Safety Risk assessment completed  CASE DISCUSSED IN MULTIDISCIPLINARY ROUNDS WITH ICU TEAM     Critical Care Time devoted to patient care services described in this note is 55 minutes.  Critical care was necessary to treat /prevent imminent and life-threatening deterioration. Overall, patient is critically ill, prognosis is  guarded.  Patient with Multiorgan failure and at high risk for cardiac arrest and death.    Corrin Parker, M.D.  [facility]  Medical Director [place] Director [facility] Cardio-Pulmonary Department

## 2021-05-15 NOTE — Progress Notes (Signed)
[facility]  ROUNDING NOTE   Subjective:  Patient seen and evaluated at bedside in the critical care unit. Continues with CRRT. Still not making any urine at this time. Weaned off of pressors at the moment.   Objective:  Vital signs in last 24 hours:  Temp:  [96.4 F (35.8 C)-99.5 F (37.5 C)] 96.8 F (36 C) (02/01 0900) Pulse Rate:  [30-138] 72 (02/01 0900) Resp:  [10-28] 20 (02/01 0900) BP: (95-149)/(42-75) 144/59 (02/01 0900) SpO2:  [89 %-100 %] 100 % (02/01 0900) FiO2 (%):  [40 %] 40 % (02/01 0800) Weight:  [128.4 kg] 128.4 kg (02/01 0452)  Weight change: -0.2 kg Filed Weights   05/13/21 0445 05/14/21 0433 05/15/21 0452  Weight: 130.7 kg 128.6 kg 128.4 kg    Intake/Output: I/O last 3 completed shifts: In: 4005.8 [I.V.:1585.9; Blood:482.5; Other:40; NG/GT:973.3; IV Piggyback:924.1] Out: 5718 [Drains:570; QZESP:2330]   Intake/Output this shift:  Total I/O In: 139.1 [I.V.:49.1; NG/GT:90] Out: 360 [Other:360]  Physical Exam: General: Critically ill-appearing  Head: Normocephalic, atraumatic.  Endotracheal tube in place, dried blood in oral cavity  Eyes: Anicteric  Neck: Supple  Lungs:  Scattered rhonchi bilateral, normal effort  Heart: S1S2 no rubs  Abdomen:  Soft, nontender, bowel sounds present  Extremities: Left lower extremity wound VAC in place.  2+ right lower extremity edema  Neurologic: Intubated, sedated  Skin: No acute rash  Access: Left IJ temporary dialysis catheter    Basic Metabolic Panel: Recent Labs  Lab 05/11/21 2052 05/12/21 0431 05/12/21 1025 05/12/21 1603 05/13/21 0311 05/13/21 1650 05/14/21 0410 05/14/21 1555 05/15/21 0313  NA 136   < > 132*   < > 131* 133* 133* 129* 131*  K 4.6   < > 4.4   < > 4.0 3.9 3.6 3.9 3.8  CL 95*   < > 94*   < > 98 97* 98 96* 98  CO2 25   < > 27   < > '26 25 26 23 24  ' GLUCOSE 123*   < > 155*   < > 159* 146* 193* 275* 228*  BUN 72*   < > 50*   < > 36* 31* 33* 39* 48*  CREATININE 3.28*   <  > 2.29*   < > 1.77* 1.56* 1.45* 1.75* 1.66*  CALCIUM 6.7*   < > 7.6*   < > 8.1* 8.8* 9.1 9.2 9.2  MG 2.2  --  2.1  --  1.8  --  1.9  --  1.9  PHOS 8.4*   < > 5.8*   < > 3.4 2.8 2.3* 2.6 2.3*   < > = values in this interval not displayed.     Liver Function Tests: Recent Labs  Lab 05/11/21 0531 05/11/21 1600 05/12/21 0431 05/12/21 1025 05/12/21 1603 05/13/21 0311 05/13/21 1650 05/14/21 0410 05/14/21 1555 05/15/21 0313  AST 4,359*  --  6,519* 6,250*  --  4,114*  --   --   --  639*  ALT 1,746*  --  1,613* 1,711*  --  1,400*  --   --   --  749*  ALKPHOS 185*  --  232* 262*  --  266*  --   --   --  328*  BILITOT 2.5*  --  3.0* 3.9*  --  4.0*  --   --   --  5.0*  PROT 4.6*  --  5.3* 6.3*  --  5.9*  --   --   --  6.6  ALBUMIN <1.5*   < > 2.1*   2.0* 2.5*   < > 2.4*   2.4* 2.6* 3.0* 2.9* 2.6*   2.6*   < > = values in this interval not displayed.    No results for input(s): LIPASE, AMYLASE in the last 168 hours. No results for input(s): AMMONIA in the last 168 hours.  CBC: Recent Labs  Lab 05/11/21 0531 05/11/21 1239 05/12/21 1025 05/13/21 0311 05/13/21 1650 05/14/21 0410 05/14/21 1056 05/15/21 0313  WBC 42.8*  --  40.8* 34.8*  --  28.4*  --  24.5*  NEUTROABS 34.4*  --   --   --   --   --   --  22.7*  HGB 10.8*   < > 8.2* 7.3* 7.6* 6.7* 8.0* 8.7*  HCT 33.2*   < > 24.5* 22.2* 22.4* 20.5* 23.5* 26.5*  MCV 88.3  --  85.7 87.7  --  86.5  --  88.0  PLT 219  --  187 141*  --  121*  --  122*   < > = values in this interval not displayed.     Cardiac Enzymes: Recent Labs  Lab 05/08/21 1313 05/12/21 1603  CKTOTAL 1,575* 4,670*     BNP: Invalid input(s): POCBNP  CBG: Recent Labs  Lab 05/14/21 1516 05/14/21 1925 05/14/21 2328 05/15/21 0355 05/15/21 0731  GLUCAP 246* 232* 209* 201* 190*     Microbiology: Results for orders placed or performed during the hospital encounter of 05/04/21  Blood Culture (routine x 2)     Status: Abnormal   Collection Time:  05/04/21  7:43 PM   Specimen: BLOOD  Result Value Ref Range Status   Specimen Description   Final    BLOOD RIGHT ANTECUBITAL Performed at [facility], [place] [address] [place], [place] [address]    Special Requests   Final    BOTTLES DRAWN AEROBIC AND ANAEROBIC Blood Culture adequate volume Performed at [facility], [address] [place], [place] [address]    Culture  Setup Time   Final    GRAM POSITIVE COCCI IN BOTH AEROBIC AND ANAEROBIC BOTTLES RESULT CALLED TO, READ BACK BY AND VERIFIED WITH: Nilsa Nutting 05/05/21 @ 1007 BY SB    Culture (A)  Final    GROUP A STREP (S.PYOGENES) ISOLATED HEALTH DEPARTMENT NOTIFIED Performed at [facility], [place] [address], [place], [place] [address]    Report Status 05/07/2021 FINAL  Final   Organism ID, Bacteria GROUP A STREP (S.PYOGENES) ISOLATED  Final      Susceptibility   Group a strep (s.pyogenes) isolated - MIC*    PENICILLIN <=0.06 SENSITIVE Sensitive     CEFTRIAXONE <=0.12 SENSITIVE Sensitive     ERYTHROMYCIN <=0.12 SENSITIVE Sensitive     LEVOFLOXACIN 1 SENSITIVE Sensitive     VANCOMYCIN 0.5 SENSITIVE Sensitive     * GROUP A STREP (S.PYOGENES) ISOLATED  Blood Culture ID Panel (Reflexed)     Status: Abnormal   Collection Time: 05/04/21  7:43 PM  Result Value Ref Range Status   Enterococcus faecalis NOT DETECTED NOT DETECTED Final   Enterococcus Faecium NOT DETECTED NOT DETECTED Final   Listeria monocytogenes NOT DETECTED NOT DETECTED Final   Staphylococcus species NOT DETECTED NOT DETECTED Final   Staphylococcus aureus (BCID) NOT DETECTED NOT DETECTED Final   Staphylococcus epidermidis NOT DETECTED NOT DETECTED Final   Staphylococcus lugdunensis NOT DETECTED NOT DETECTED Final   Streptococcus species DETECTED (A) NOT DETECTED Final  Comment: RESULT CALLED TO, READ BACK BY AND VERIFIED WITH: Nilsa Nutting 05/05/21 @ 1007 BY SB    Streptococcus agalactiae NOT DETECTED NOT DETECTED Final   Streptococcus  pneumoniae NOT DETECTED NOT DETECTED Final   Streptococcus pyogenes DETECTED (A) NOT DETECTED Final    Comment: RESULT CALLED TO, READ BACK BY AND VERIFIED WITH: Nilsa Nutting 05/05/21 @ 1007 BY SB    A.calcoaceticus-baumannii NOT DETECTED NOT DETECTED Final   Bacteroides fragilis NOT DETECTED NOT DETECTED Final   Enterobacterales NOT DETECTED NOT DETECTED Final   Enterobacter cloacae complex NOT DETECTED NOT DETECTED Final   Escherichia coli NOT DETECTED NOT DETECTED Final   Klebsiella aerogenes NOT DETECTED NOT DETECTED Final   Klebsiella oxytoca NOT DETECTED NOT DETECTED Final   Klebsiella pneumoniae NOT DETECTED NOT DETECTED Final   Proteus species NOT DETECTED NOT DETECTED Final   Salmonella species NOT DETECTED NOT DETECTED Final   Serratia marcescens NOT DETECTED NOT DETECTED Final   Haemophilus influenzae NOT DETECTED NOT DETECTED Final   Neisseria meningitidis NOT DETECTED NOT DETECTED Final   Pseudomonas aeruginosa NOT DETECTED NOT DETECTED Final   Stenotrophomonas maltophilia NOT DETECTED NOT DETECTED Final   Candida albicans NOT DETECTED NOT DETECTED Final   Candida auris NOT DETECTED NOT DETECTED Final   Candida glabrata NOT DETECTED NOT DETECTED Final   Candida krusei NOT DETECTED NOT DETECTED Final   Candida parapsilosis NOT DETECTED NOT DETECTED Final   Candida tropicalis NOT DETECTED NOT DETECTED Final   Cryptococcus neoformans/gattii NOT DETECTED NOT DETECTED Final    Comment: Performed at [facility], [address] [place], [place] [address]  Blood Culture (routine x 2)     Status: Abnormal   Collection Time: 05/04/21  9:29 PM   Specimen: BLOOD  Result Value Ref Range Status   Specimen Description   Final    BLOOD RIGHT ANTECUBITAL Performed at [facility], [address], [place], [place] [address]    Special Requests   Final    BOTTLES DRAWN AEROBIC AND ANAEROBIC Blood Culture adequate volume Performed at [facility], [address] [place], [place] [address]    Culture  Setup Time   Final    GRAM POSITIVE COCCI IN BOTH AEROBIC AND ANAEROBIC BOTTLES CRITICAL VALUE NOTED.  VALUE IS CONSISTENT WITH PREVIOUSLY REPORTED AND CALLED VALUE. Performed at [facility], [address], [place], [place] [address]    Culture (A)  Final    GROUP A STREP (S.PYOGENES) ISOLATED HEALTH DEPARTMENT NOTIFIED SUSCEPTIBILITIES PERFORMED ON PREVIOUS CULTURE WITHIN THE LAST 5 DAYS. STAPHYLOCOCCUS EPIDERMIDIS THE SIGNIFICANCE OF ISOLATING THIS ORGANISM FROM A SINGLE SET OF BLOOD CULTURES WHEN MULTIPLE SETS ARE DRAWN IS UNCERTAIN. PLEASE NOTIFY THE MICROBIOLOGY DEPARTMENT WITHIN ONE WEEK IF SPECIATION AND SENSITIVITIES ARE REQUIRED. Performed at [facility], [place] [address] [place], [place] [address]    Report Status 05/08/2021 FINAL  Final  Resp Panel by RT-PCR (Flu A&B, Covid) Nasopharyngeal Swab     Status: None   Collection Time: 05/04/21  9:50 PM   Specimen: Nasopharyngeal Swab; Nasopharyngeal(NP) swabs in vial transport medium  Result Value Ref Range Status   SARS Coronavirus 2 by RT PCR NEGATIVE NEGATIVE Final    Comment: (NOTE) SARS-CoV-2 target nucleic acids are NOT DETECTED.  The SARS-CoV-2 RNA is generally detectable in upper respiratory specimens during the acute phase of infection. The lowest concentration of SARS-CoV-2 viral copies this assay can detect is 138 copies/mL. A negative result does  not preclude SARS-Cov-2 infection and should not be used as the sole basis for treatment or other patient management decisions. A negative result may occur with  improper specimen collection/handling, submission of specimen other than nasopharyngeal swab, presence of viral mutation(s) within the areas targeted by this assay, and inadequate number of viral copies(<138 copies/mL). A negative result must be combined with clinical observations, patient history, and epidemiological information. The expected  result is Negative.  Fact Sheet for Patients:  EntrepreneurPulse.com.au  Fact Sheet for Healthcare Providers:  IncredibleEmployment.be  This test is no t yet approved or cleared by the [place] FDA and  has been authorized for detection and/or diagnosis of SARS-CoV-2 by FDA under an Emergency Use Authorization (EUA). This EUA will remain  in effect (meaning this test can be used) for the duration of the COVID-19 declaration under Section 564(b)(1) of the Act, 21 U.S.C.section 360bbb-3(b)(1), unless the authorization is terminated  or revoked sooner.       Influenza A by PCR NEGATIVE NEGATIVE Final   Influenza B by PCR NEGATIVE NEGATIVE Final    Comment: (NOTE) The Xpert Xpress SARS-CoV-2/FLU/RSV plus assay is intended as an aid in the diagnosis of influenza from Nasopharyngeal swab specimens and should not be used as a sole basis for treatment. Nasal washings and aspirates are unacceptable for Xpert Xpress SARS-CoV-2/FLU/RSV testing.  Fact Sheet for Patients: EntrepreneurPulse.com.au  Fact Sheet for Healthcare Providers: IncredibleEmployment.be  This test is not yet approved or cleared by the [place] FDA and has been authorized for detection and/or diagnosis of SARS-CoV-2 by FDA under an Emergency Use Authorization (EUA). This EUA will remain in effect (meaning this test can be used) for the duration of the COVID-19 declaration under Section 564(b)(1) of the Act, 21 U.S.C. section 360bbb-3(b)(1), unless the authorization is terminated or revoked.  Performed at [facility], [address] [place], [place] [address]   MRSA Next Gen by PCR, Nasal     Status: None   Collection Time: 05/05/21 11:02 AM   Specimen: Nasal Mucosa; Nasal Swab  Result Value Ref Range Status   MRSA by PCR Next Gen NOT DETECTED NOT DETECTED Final    Comment: (NOTE) The GeneXpert MRSA Assay (FDA approved  for NASAL specimens only), is one component of a comprehensive MRSA colonization surveillance program. It is not intended to diagnose MRSA infection nor to guide or monitor treatment for MRSA infections. Test performance is not FDA approved in patients less than 43 years old. Performed at [facility], [address] [place], [place] [address]   Urine Culture     Status: None   Collection Time: 05/07/21  2:30 PM   Specimen: Urine, Random  Result Value Ref Range Status   Specimen Description   Final    URINE, RANDOM Performed at [facility], [address] [place], [place] [address]    Special Requests   Final    NONE Performed at [facility], [address] [place], [place] [address]    Culture   Final    NO GROWTH Performed at [facility], [place] [address] [place], [place] [address]    Report Status 05/09/2021 FINAL  Final  Aerobic/Anaerobic Culture w Gram Stain (surgical/deep wound)     Status: None   Collection Time: 05/07/21  9:35 PM   Specimen: PATH Other; Tissue  Result Value Ref Range Status   Specimen Description   Final    LEG LEFT Performed at [facility], [address] [place], [place] [address]    Special Requests PT PREVIOUSLY ON CLINDAMYCIN,PENICILLIN  Final   Gram Stain   Final    RARE WBC PRESENT,BOTH PMN AND MONONUCLEAR NO ORGANISMS SEEN    Culture   Final    No growth aerobically or anaerobically. Performed at [facility], [place] [address], [place], [place] [address]    Report Status 05/13/2021 FINAL  Final  CULTURE, BLOOD (ROUTINE X 2) w Reflex to ID Panel     Status: None   Collection Time: 05/09/21  7:13 AM   Specimen: BLOOD  Result Value Ref Range Status   Specimen Description BLOOD BLOOD LEFT HAND  Final   Special Requests   Final    BOTTLES DRAWN AEROBIC AND ANAEROBIC Blood Culture adequate volume   Culture   Final    NO GROWTH 5 DAYS Performed at [facility], [address] [place], [place] [address]    Report Status 05/14/2021 FINAL  Final  CULTURE, BLOOD (ROUTINE X 2) w Reflex to ID Panel     Status: None   Collection Time: 05/09/21  7:23 AM   Specimen: BLOOD  Result Value Ref Range Status   Specimen Description BLOOD LEFT THUMB  Final   Special Requests   Final    BOTTLES DRAWN AEROBIC AND ANAEROBIC Blood Culture results may not be optimal due to an inadequate volume of blood received in culture bottles   Culture   Final    NO GROWTH 5 DAYS Performed at [facility], [address] [place], [place] [address]    Report Status 05/14/2021 FINAL  Final    Coagulation Studies: Recent Labs    05/14/21 1056  LABPROT 21.0*  INR 1.8*    Urinalysis: No results for input(s): COLORURINE, LABSPEC, PHURINE, GLUCOSEU, HGBUR, BILIRUBINUR, KETONESUR, PROTEINUR, UROBILINOGEN, NITRITE, LEUKOCYTESUR in the last 72 hours.  Invalid input(s): APPERANCEUR    Imaging: DG Chest 1 View  Result Date: 05/15/2021 CLINICAL DATA:  71 year old female with respiratory failure. Sepsis. EXAM: CHEST  1 VIEW COMPARISON:  Portable chest 05/11/2021 and earlier. FINDINGS: Portable AP supine view at 0609 hours. The patient remains mildly rotated to the right. Endotracheal tube tip in good position between the clavicles and carina. Stable left IJ approach dual lumen vascular catheter. Enteric tube and pH probe in place and appears stable. Right IJ central line tip now projects at the level the right atrium although lung volumes are slightly lower. Stable cardiac size and mediastinal contours. No pneumothorax. No pleural effusion or consolidation. Increased pulmonary vascularity since 05/10/2021. Paucity of bowel gas. No acute osseous abnormality identified. IMPRESSION: 1. Essentially stable lines and tubes. 2. Lower lung volumes with increased pulmonary vascularity. Consider mild or developing interstitial edema. No pleural effusion or consolidation. Electronically Signed   By:  Genevie Ann M.D.   On: 05/15/2021 06:46   DG Abd 1 View  Result Date: 05/15/2021 CLINICAL DATA:  71 year old female with respiratory failure. Sepsis. EXAM: ABDOMEN - 1 VIEW COMPARISON:  05/11/2021. FINDINGS: Portable AP supine views at 0603 hours. Enteric tube terminates in the gastric body. Side hole appears to be inside the stomach. There is mild to moderate gastric distention with air. A small volume of fundal oral contrast persists. Paucity of bowel gas elsewhere in the visible abdomen and pelvis. Left hemipelvis phlebolith. No acute osseous abnormality identified. IMPRESSION: 1. Satisfactory enteric tube termination in the stomach. Mild to moderate gastric distention with air. Small volume  oral contrast suspected in the gastric fundus. 2. Paucity of bowel gas elsewhere in the abdomen and pelvis. Electronically Signed   By: Genevie Ann M.D.   On: 05/15/2021 06:47   CT HEAD WO CONTRAST (5MM)  Addendum Date: 05/14/2021   ADDENDUM REPORT: 05/14/2021 16:47 ADDENDUM: These results will be called to the ordering clinician or representative by the Radiologist Assistant, and communication documented in the PACS or [employer]. Electronically Signed   By: Franchot Gallo M.D.   On: 05/14/2021 16:47   Result Date: 05/14/2021 CLINICAL DATA:  Subarachnoid hemorrhage.  Sepsis. EXAM: CT HEAD WITHOUT CONTRAST TECHNIQUE: Contiguous axial images were obtained from the base of the skull through the vertex without intravenous contrast. RADIATION DOSE REDUCTION: This exam was performed according to the departmental dose-optimization program which includes automated exposure control, adjustment of the mA and/or kV according to patient size and/or use of iterative reconstruction technique. COMPARISON:  MRI head 05/14/2021 FINDINGS: Brain: Hyperdensity in the left middle frontal sulcus. This corresponds to the FLAIR hyperintensity on MRI and is most consistent with small volume acute subarachnoid hemorrhage on the left. No other  areas of high-density hemorrhage. Ventricle size normal.  Negative for acute infarct or mass. Empty sella with enlargement of the sella filled with CSF. Small pituitary. Vascular: Negative for hyperdense vessel Skull: Negative Sinuses/Orbits: Extensive mucosal edema paranasal sinuses. Probable air-fluid levels in the maxillary sinus bilaterally. Bilateral proptosis without orbital mass. Other: None IMPRESSION: Hyperintensity left middle frontal sulcus compatible with acute subarachnoid hemorrhage, small volume. No other acute intracranial abnormality. No hydrocephalus or infarct. Extensive paranasal sinus mucosal disease with air-fluid levels. Electronically Signed: By: Franchot Gallo M.D. On: 05/14/2021 15:59   MR ANGIO HEAD WO CONTRAST  Result Date: 05/15/2021 CLINICAL DATA:  Subarachnoid hemorrhage EXAM: MRA HEAD WITHOUT CONTRAST TECHNIQUE: Angiographic images of the Circle of Willis were acquired using MRA technique without intravenous contrast. COMPARISON:  No prior MRA, correlation is made with MRI 05/14/2021 FINDINGS: Anterior circulation: Both internal carotid arteries are patent to the termini, without significant stenosis. A1 segments patent. Normal anterior communicating artery. Anterior cerebral arteries are patent to their distal aspects. No M1 stenosis or occlusion. Normal MCA bifurcations. Distal MCA branches perfused and symmetric. Posterior circulation: Vertebral arteries patent to the vertebrobasilar junction without stenosis. Basilar patent to its distal aspect. Superior cerebellar arteries patent bilaterally. Patent P1 segments. PCAs perfused to their distal aspects without stenosis. The bilateral posterior communicating arteries are patent. Anatomic variants: None significant Other: None. IMPRESSION: No intracranial large vessel occlusion or significant stenosis. No aneurysm or other vascular abnormality. Electronically Signed   By: Merilyn Baba M.D.   On: 05/15/2021 02:43   MR BRAIN WO  CONTRAST  Result Date: 05/14/2021 CLINICAL DATA:  Altered mental status, septic shock secondary to left lower extremity cellulitis EXAM: MRI HEAD WITHOUT CONTRAST TECHNIQUE: Multiplanar, multiecho pulse sequences of the brain and surrounding structures were obtained without intravenous contrast. COMPARISON:  Brain MRI 08/10/2013 FINDINGS: Brain: There is no evidence of acute infarct. There is sulcal FLAIR hyperintensity with trace SWI signal dropout overlying a left frontal lobe sulcus and the right superior frontal sulcus (15-35, 15-39). There is no other evidence of acute intracranial hemorrhage or extra-axial fluid collection. Parenchymal volume is normal. The ventricles are normal in size. Parenchymal signal is normal, with no significant burden of white matter microangiopathic change. A partially empty sella is noted. There is no mass lesion. There is no midline shift. Vascular: Normal flow voids. Skull and upper  cervical spine: Normal marrow signal. Sinuses/Orbits: There is mucosal thickening throughout the paranasal sinuses with layering fluid in the maxillary sinuses and complete opacification of the mastoid air cells, likely at least partially related to intubation. The globes and orbits are unremarkable. Other: There is scalp fluid predominantly in the bilateral occipital regions. 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 IMPRESSION: 1. Sulcal FLAIR hyperintensity overlying bilateral frontal lobe sulci may be artifactual related to intubation; however, subarachnoid hemorrhage can have a similar appearance. Differential also includes meningitis. Recommend noncontrast CT for further evaluation for subarachnoid hemorrhage. Consider correlation with lumbar puncture to evaluate for meningitis as indicated, if the CT head is negative for hemorrhage. 2. Fluid in the bilateral occipital scalp. These results were called by telephone at the time of interpretation on 05/14/2021 at 3:10 pm to provider DANA GRAVES  NP, who verbally acknowledged these results. Electronically Signed   By: Valetta Mole M.D.   On: 05/14/2021 15:11   EEG adult  Result Date: 05/13/2021 Derek Jack, MD     05/13/2021  8:16 PM Routine EEG Report Maudean Hoffmann Cottam is a 71 y.o. female with a history of septic shock and encephalopathy who is undergoing an EEG to evaluate for seizures. Report: This EEG was acquired with electrodes placed according to the International 10-20 electrode system (including Fp1, Fp2, F3, F4, C3, C4, P3, P4, O1, O2, T3, T4, T5, T6, A1, A2, Fz, Cz, Pz). The following electrodes were missing or displaced: none. The best background was continuous at 3-4 Hz. This activity is reactive to stimulation. No sleep architecture was identified. There were frequent triphasic waves that did not appear epileptiform. There was no focal slowing. There were no interictal epileptiform discharges. There were no electrographic seizures identified. Photic stimulation and hyperventilation were not performed. Impression and clinical correlation: This EEG was obtained while sedated on hydromorphone and is abnormal due to: - Severe diffuse slowing indicative of global cerebral dysfunction - Frequent triphasic waves indicative of metabolic encephalopathy Epileptiform abnormalities were not seen during this recording. Su Monks, MD [facility] [phone] If 7pm- 7am, please page neurology on call as listed in [place].     Medications:    sodium chloride Stopped (05/13/21 2154)   amiodarone 30 mg/hr (05/15/21 0900)   cefTRIAXone (ROCEPHIN)  IV Stopped (05/14/21 1644)   feeding supplement (VITAL AF 1.2 CAL) 40 mL/hr at 05/15/21 0400   phenylephrine (NEO-SYNEPHRINE) Adult infusion Stopped (05/15/21 0700)   prismasol BGK 2/2.5 dialysis solution 1,500 mL/hr at 05/15/21 0851   prismasol BGK 2/2.5 replacement solution 500 mL/hr at 05/14/21 1700   prismasol BGK 2/2.5 replacement solution 500 mL/hr at 05/14/21 1700   propofol  (DIPRIVAN) infusion Stopped (05/15/21 0844)    vitamin C  500 mg Per Tube BID   budesonide (PULMICORT) nebulizer solution  0.25 mg Nebulization BID   chlorhexidine gluconate (MEDLINE KIT)  15 mL Mouth Rinse BID   Chlorhexidine Gluconate Cloth  6 each Topical Q0600   docusate  100 mg Per Tube BID   feeding supplement (PROSource TF)  90 mL Per Tube BID   free water  30 mL Per Tube Q4H   hydrocortisone sod succinate (SOLU-CORTEF) inj  100 mg Intravenous Q8H   insulin aspart  1-3 Units Subcutaneous Q4H   insulin detemir  38 Units Subcutaneous BID   ipratropium-albuterol  3 mL Nebulization Q6H   levETIRAcetam  1,000 mg Per Tube BID   linezolid  600 mg Per Tube Q12H   mouth rinse  15 mL Mouth Rinse 10 times per day   midodrine  10 mg Per Tube TID WC   multivitamin  1 tablet Per Tube QHS   nutrition supplement (JUVEN)  1 packet Per Tube BID BM   pantoprazole (PROTONIX) IV  40 mg Intravenous Q24H   polyethylene glycol  17 g Per Tube Daily   potassium & sodium phosphates  1 packet Per Tube TID WC & HS   sodium chloride flush  10-40 mL Intracatheter Q12H   zinc sulfate  220 mg Per Tube Daily   docusate sodium, fentaNYL (SUBLIMAZE) injection, heparin, lip balm, midazolam, ondansetron (ZOFRAN) IV, oxyCODONE, sennosides, sodium chloride, sodium chloride flush  Assessment/ Plan:  71 y.o. female with past medical history of diabetes mellitus type 2, hypertension, obstructive sleep apnea on CPAP, GERD, history of COVID-19 pneumonia who came in with left lower extremity pain and chills and subsequently found to have streptococcal pyogenes bacteremia and cellulitis status post surgical debridement with subsequent wound VAC placement.  1.  Acute kidney injury secondary to ATN from septic shock. 01/31 0701 - 02/01 0700 In: 2469 [I.V.:682.7; Blood:482.5; NG/GT:618.3; IV Piggyback:645.4] Out: 3140 [Drains:570] Lab Results  Component Value Date   CREATININE 1.66 (H) 05/15/2021   CREATININE 1.75 (H)  05/14/2021   CREATININE 1.45 (H) 05/14/2021  We will plan to maintain the patient on CRRT given the fact that she has no urine output at the moment.  Continue ultrafiltration target of 100 cc/h.  2.  Acute respiratory failure.  Weaning protocol as per pulmonary/critical care however suspect that she may end up requiring tracheostomy and PEG tube placement.  3.  Hypotension.  Currently weaned off of pressors but continue to monitor blood pressure closely.  4.  Left lower extremity deep infection status post surgical debridement and wound VAC placement/Streptococcus pyogenes infection.  Patient transition to ceftriaxone.  5.  Hyponatremia.  Sodium slightly lower today at 131.  Continue to monitor    LOS: 11 Mayes Sangiovanni 2/1/20239:42 AM

## 2021-05-15 NOTE — Progress Notes (Signed)
Pt CRRT stopped at 0125 in order to transport to MRI. Blood return succesful. CRRT restarted at 1610 with no complications.

## 2021-05-15 NOTE — Consult Note (Signed)
PHARMACY CONSULT NOTE - FOLLOW UP  Pharmacy Consult for Electrolyte Monitoring and Replacement   Recent Labs: Potassium (mmol/L)  Date Value  05/15/2021 3.8  08/11/2013 3.3 (L)   Magnesium (mg/dL)  Date Value  05/15/2021 1.9   Calcium (mg/dL)  Date Value  05/15/2021 9.2   Calcium, Total (mg/dL)  Date Value  08/11/2013 9.2   Albumin (g/dL)  Date Value  05/15/2021 2.6 (L)  05/15/2021 2.6 (L)  08/11/2013 3.1 (L)   Phosphorus (mg/dL)  Date Value  05/15/2021 2.3 (L)   Sodium (mmol/L)  Date Value  05/15/2021 131 (L)  08/11/2013 138   Assessment: 71 yo female with a previous medical history of diabetes mellitus type 2, hypertension, obstructive sleep apnea on CPAP, asthma/COPD, GERD, history of COVID-19 pneumonia presenting with lower left extremity pain and chills. Patient found to have severe sepsis and strep pyogenes bacteremia on admission and is currently intubated and sedated. Pharmacy has been consulted to monitor and replace electrolytes.  Nutrition:  --Feeds per tube --Free water per tube 30 mL Q4H  Goal of Therapy:  Electrolytes within normal limits  Plan:  --Hyponatremia Na 131, nephrology following --Phosphorous 2.3, will give Phos-Nak 1 packet per tube x 3 doses --Follow-up with evening labs  [facility] 05/15/2021 10:43 AM

## 2021-05-16 ENCOUNTER — Inpatient Hospital Stay (HOSPITAL_COMMUNITY)
Admit: 2021-05-16 | Discharge: 2021-05-16 | Disposition: A | Discharge status: Home / Self Care | Payer: Medicare HMO | Attending: Internal Medicine | Admitting: Internal Medicine

## 2021-05-16 DIAGNOSIS — M726 Necrotizing fasciitis: Secondary | ICD-10-CM

## 2021-05-16 DIAGNOSIS — A400 Sepsis due to streptococcus, group A: Secondary | ICD-10-CM | POA: Diagnosis not present

## 2021-05-16 DIAGNOSIS — R9431 Abnormal electrocardiogram [ECG] [EKG]: Secondary | ICD-10-CM | POA: Diagnosis not present

## 2021-05-16 DIAGNOSIS — R6521 Severe sepsis with septic shock: Secondary | ICD-10-CM | POA: Diagnosis not present

## 2021-05-16 DIAGNOSIS — Z9911 Dependence on respirator [ventilator] status: Secondary | ICD-10-CM

## 2021-05-16 DIAGNOSIS — B950 Streptococcus, group A, as the cause of diseases classified elsewhere: Secondary | ICD-10-CM

## 2021-05-16 DIAGNOSIS — B955 Unspecified streptococcus as the cause of diseases classified elsewhere: Secondary | ICD-10-CM

## 2021-05-16 DIAGNOSIS — I480 Paroxysmal atrial fibrillation: Secondary | ICD-10-CM | POA: Diagnosis not present

## 2021-05-16 DIAGNOSIS — A419 Sepsis, unspecified organism: Secondary | ICD-10-CM | POA: Diagnosis not present

## 2021-05-16 DIAGNOSIS — J441 Chronic obstructive pulmonary disease with (acute) exacerbation: Secondary | ICD-10-CM | POA: Diagnosis not present

## 2021-05-16 DIAGNOSIS — A483 Toxic shock syndrome: Secondary | ICD-10-CM

## 2021-05-16 DIAGNOSIS — A491 Streptococcal infection, unspecified site: Secondary | ICD-10-CM | POA: Diagnosis not present

## 2021-05-16 LAB — APTT: aPTT: 32 seconds (ref 24–36)

## 2021-05-16 LAB — ECHOCARDIOGRAM COMPLETE
AR max vel: 2.07 cm2
AV Area VTI: 1.95 cm2
AV Area mean vel: 1.83 cm2
AV Mean grad: 6 mmHg
AV Peak grad: 9.9 mmHg
Ao pk vel: 1.57 m/s
Area-P 1/2: 3.27 cm2
Height: 65 in
MV VTI: 1.81 cm2
S' Lateral: 3.74 cm
Weight: 4373.93 oz

## 2021-05-16 LAB — PROTIME-INR
INR: 1.3 — ABNORMAL HIGH (ref 0.8–1.2)
Prothrombin Time: 15.7 seconds — ABNORMAL HIGH (ref 11.4–15.2)

## 2021-05-16 LAB — RENAL FUNCTION PANEL
Albumin: 2.2 g/dL — ABNORMAL LOW (ref 3.5–5.0)
Albumin: 2.1 g/dL — ABNORMAL LOW (ref 3.5–5.0)
Anion gap: 11 (ref 5–15)
Anion gap: 13 (ref 5–15)
BUN: 45 mg/dL — ABNORMAL HIGH (ref 8–23)
BUN: 43 mg/dL — ABNORMAL HIGH (ref 8–23)
CO2: 22 mmol/L (ref 22–32)
CO2: 20 mmol/L — ABNORMAL LOW (ref 22–32)
Calcium: 9.6 mg/dL (ref 8.9–10.3)
Calcium: 8.7 mg/dL — ABNORMAL LOW (ref 8.9–10.3)
Chloride: 101 mmol/L (ref 98–111)
Chloride: 100 mmol/L (ref 98–111)
Creatinine, Ser: 1.32 mg/dL — ABNORMAL HIGH (ref 0.44–1.00)
Creatinine, Ser: 1.2 mg/dL — ABNORMAL HIGH (ref 0.44–1.00)
GFR, Estimated: 43 mL/min — ABNORMAL LOW (ref >60)
GFR, Estimated: 48 mL/min — ABNORMAL LOW (ref >60)
Glucose, Bld: 156 mg/dL — ABNORMAL HIGH (ref 70–99)
Glucose, Bld: 124 mg/dL — ABNORMAL HIGH (ref 70–99)
Phosphorus: 2.4 mg/dL — ABNORMAL LOW (ref 2.5–4.6)
Phosphorus: 2.9 mg/dL (ref 2.5–4.6)
Potassium: 3.1 mmol/L — ABNORMAL LOW (ref 3.5–5.1)
Potassium: 3.4 mmol/L — ABNORMAL LOW (ref 3.5–5.1)
Sodium: 134 mmol/L — ABNORMAL LOW (ref 135–145)
Sodium: 133 mmol/L — ABNORMAL LOW (ref 135–145)

## 2021-05-16 LAB — CBC
HCT: 28.6 % — ABNORMAL LOW (ref 36.0–46.0)
Hemoglobin: 9.5 g/dL — ABNORMAL LOW (ref 12.0–15.0)
MCH: 28.5 pg (ref 26.0–34.0)
MCHC: 33.2 g/dL (ref 30.0–36.0)
MCV: 85.9 fL (ref 80.0–100.0)
Platelets: 144 10*3/uL — ABNORMAL LOW (ref 150–400)
RBC: 3.33 MIL/uL — ABNORMAL LOW (ref 3.87–5.11)
RDW: 19.9 % — ABNORMAL HIGH (ref 11.5–15.5)
WBC: 12.6 10*3/uL — ABNORMAL HIGH (ref 4.0–10.5)
nRBC: 38.8 % — ABNORMAL HIGH (ref 0.0–0.2)

## 2021-05-16 LAB — MAGNESIUM: Magnesium: 1.8 mg/dL (ref 1.7–2.4)

## 2021-05-16 LAB — GLUCOSE, CAPILLARY
Glucose-Capillary: 140 mg/dL — ABNORMAL HIGH (ref 70–99)
Glucose-Capillary: 147 mg/dL — ABNORMAL HIGH (ref 70–99)
Glucose-Capillary: 143 mg/dL — ABNORMAL HIGH (ref 70–99)
Glucose-Capillary: 115 mg/dL — ABNORMAL HIGH (ref 70–99)
Glucose-Capillary: 115 mg/dL — ABNORMAL HIGH (ref 70–99)

## 2021-05-16 MED ORDER — POTASSIUM & SODIUM PHOSPHATES 280-160-250 MG PO PACK
1.0000 | PACK | Freq: Three times a day (TID) | ORAL | Status: AC
  Administered 2021-05-16 (×3): 1
  Filled 2021-05-16 (×3): qty 1

## 2021-05-16 MED ORDER — SODIUM CHLORIDE 0.9 % IV SOLN
0.5000 mg/h | INTRAVENOUS | Status: DC
  Administered 2021-05-16: 0.5 mg/h via INTRAVENOUS
  Filled 2021-05-16: qty 5

## 2021-05-16 MED ORDER — PHENYLEPHRINE HCL-NACL 20-0.9 MG/250ML-% IV SOLN
0.0000 ug/min | INTRAVENOUS | Status: DC
  Filled 2021-05-16: qty 250

## 2021-05-16 MED ORDER — PRISMASOL BGK 4/2.5 32-4-2.5 MEQ/L EC SOLN: Status: DC

## 2021-05-16 MED ORDER — PHENYLEPHRINE CONCENTRATED 100MG/250ML (0.4 MG/ML) INFUSION SIMPLE
0.0000 ug/min | INTRAVENOUS | Status: DC
  Administered 2021-05-16: 30 ug/min via INTRAVENOUS
  Administered 2021-05-16: 35 ug/min via INTRAVENOUS
  Filled 2021-05-16 (×4): qty 250

## 2021-05-16 MED ORDER — PRISMASOL BGK 4/2.5 32-4-2.5 MEQ/L REPLACEMENT SOLN
Status: DC
  Filled 2021-05-16 (×15): qty 5000

## 2021-05-16 MED ORDER — HYDROMORPHONE BOLUS VIA INFUSION
0.2500 mg | INTRAVENOUS | Status: DC | PRN
  Filled 2021-05-16: qty 1

## 2021-05-16 MED ORDER — MAGNESIUM SULFATE 2 GM/50ML IV SOLN
2.0000 g | Freq: Once | INTRAVENOUS | Status: AC
  Administered 2021-05-16: 2 g via INTRAVENOUS
  Filled 2021-05-16: qty 50

## 2021-05-16 MED ORDER — VITAL AF 1.2 CAL PO LIQD
1000.0000 mL | ORAL | Status: DC
  Administered 2021-05-16 – 2021-05-19 (×6): 1000 mL

## 2021-05-16 MED ORDER — HYDROMORPHONE HCL 1 MG/ML IJ SOLN
0.5000 mg | Freq: Once | INTRAMUSCULAR | Status: AC
  Administered 2021-05-16: 0.5 mg via INTRAVENOUS

## 2021-05-16 MED ORDER — PHENYLEPHRINE CONCENTRATED 100MG/250ML (0.4 MG/ML) INFUSION SIMPLE
0.0000 ug/min | INTRAVENOUS | Status: DC
  Administered 2021-05-16: 115 ug/min via INTRAVENOUS
  Filled 2021-05-16: qty 250

## 2021-05-16 MED ORDER — PROSOURCE TF PO LIQD
45.0000 mL | Freq: Three times a day (TID) | ORAL | Status: DC
  Administered 2021-05-16 – 2021-05-20 (×10): 45 mL
  Filled 2021-05-16 (×12): qty 45

## 2021-05-16 MED ORDER — ARTIFICIAL TEARS OPHTHALMIC OINT
TOPICAL_OINTMENT | OPHTHALMIC | Status: DC | PRN
  Administered 2021-05-16: 1 via OPHTHALMIC
  Filled 2021-05-16 (×2): qty 3.5

## 2021-05-16 NOTE — Progress Notes (Signed)
*  PRELIMINARY RESULTS* Echocardiogram 2D Echocardiogram has been performed.  Alexandra Foster 05/16/2021, 12:46 PM

## 2021-05-16 NOTE — Consult Note (Addendum)
PHARMACY CONSULT NOTE - FOLLOW UP  Pharmacy Consult for Electrolyte Monitoring and Replacement   Recent Labs: Potassium (mmol/L)  Date Value  05/16/2021 3.1 (L)  08/11/2013 3.3 (L)   Magnesium (mg/dL)  Date Value  05/16/2021 1.8   Calcium (mg/dL)  Date Value  05/16/2021 9.6   Calcium, Total (mg/dL)  Date Value  08/11/2013 9.2   Albumin (g/dL)  Date Value  05/16/2021 2.2 (L)  08/11/2013 3.1 (L)   Phosphorus (mg/dL)  Date Value  05/16/2021 2.4 (L)   Sodium (mmol/L)  Date Value  05/16/2021 134 (L)  08/11/2013 138   Assessment: 71 yo female with a previous medical history of diabetes mellitus type 2, hypertension, obstructive sleep apnea on CPAP, asthma/COPD, GERD, history of COVID-19 pneumonia presenting with lower left extremity pain and chills. Patient found to have severe sepsis and strep pyogenes bacteremia on admission and is currently intubated and sedated. Pharmacy has been consulted to monitor and replace electrolytes.  Nutrition:  --Feeds per tube --Free water per tube 30 mL Q4H  Goal of Therapy:  Electrolytes within normal limits  Plan:  --Mild hyponatremia Na 134 likely due to fluid overload volume management per CRRT --Phosphorous 2.4, will give Phos-Nak 1 packet per tube x 3 doses --K 3.1, will defer potassium management to nephrology  --Mg 1.8, gave Mag sulfate IV 2 g x 1 --Follow-up with evening labs  [facility] 05/16/2021 7:27 AM

## 2021-05-16 NOTE — Progress Notes (Signed)
Progress Note  Patient Name: Alexandra Foster Date of Encounter: 05/16/2021  [facility] Cardiologist: Kathlyn Sacramento, MD   Subjective   Patient intubated,  Notes reviewed, case discussed with nursing Bradycardia yesterday, amiodarone infusion held Later in afternoon around 3:30 PM converted to atrial fibrillation with RVR Amiodarone restarted, converted to normal sinus rhythm around 6:15 PM Remains on amiodarone infusion 0.5 mg/min Normal sinus rhythm overnight and this morning, blood pressure stable on low-dose pressor  Inpatient Medications    Scheduled Meds:  amiodarone  200 mg Per Tube BID   Followed by   Derrill Memo ON 05/22/2021] amiodarone  200 mg Per Tube Daily   arformoterol  15 mcg Nebulization BID   vitamin C  500 mg Per Tube BID   budesonide (PULMICORT) nebulizer solution  0.25 mg Nebulization BID   chlorhexidine gluconate (MEDLINE KIT)  15 mL Mouth Rinse BID   Chlorhexidine Gluconate Cloth  6 each Topical Q0600   docusate  100 mg Per Tube BID   feeding supplement (PROSource TF)  45 mL Per Tube TID   free water  30 mL Per Tube Q4H   insulin aspart  1-3 Units Subcutaneous Q4H   insulin detemir  38 Units Subcutaneous BID   levETIRAcetam  1,000 mg Per Tube BID   linezolid  600 mg Per Tube Q12H   magic mouthwash  10 mL Oral QID   mouth rinse  15 mL Mouth Rinse 10 times per day   midodrine  10 mg Per Tube TID WC   multivitamin  1 tablet Per Tube QHS   nutrition supplement (JUVEN)  1 packet Per Tube BID BM   pantoprazole (PROTONIX) IV  40 mg Intravenous Q24H   polyethylene glycol  17 g Per Tube Daily   potassium & sodium phosphates  1 packet Per Tube TID WC & HS   revefenacin  175 mcg Nebulization Daily   sodium chloride flush  10-40 mL Intracatheter Q12H   zinc sulfate  220 mg Per Tube Daily   Continuous Infusions:   prismasol BGK 4/2.5 500 mL/hr at 05/16/21 0805    prismasol BGK 4/2.5 500 mL/hr at 05/16/21 0805   sodium chloride Stopped (05/13/21 2154)    amiodarone 30 mg/hr (05/16/21 1500)   cefTRIAXone (ROCEPHIN)  IV Stopped (05/15/21 1642)   feeding supplement (VITAL AF 1.2 CAL) 65 mL/hr at 05/16/21 1415   HYDROmorphone 0.5 mg/hr (05/16/21 1500)   phenylephrine (NEO-SYNEPHRINE) Adult infusion 125 mcg/min (05/16/21 1500)   prismasol BGK 4/2.5 2,000 mL/hr at 05/16/21 1304   PRN Meds: artificial tears, docusate sodium, fentaNYL (SUBLIMAZE) injection, heparin, HYDROmorphone, lip balm, midazolam, ondansetron (ZOFRAN) IV, oxyCODONE, sennosides, sodium chloride, sodium chloride flush   Vital Signs    Vitals:   05/16/21 1330 05/16/21 1400 05/16/21 1430 05/16/21 1500  BP: (!) 135/48 (!) 134/56 116/67 125/90  Pulse:  77 76 74  Resp: (!) 23 (!) '21 19 18  ' Temp: (!) 96.6 F (35.9 C) (!) 96.6 F (35.9 C) (!) 96.8 F (36 C) (!) 97 F (36.1 C)  TempSrc:      SpO2:  100% 99% 100%  Weight:      Height:        Intake/Output Summary (Last 24 hours) at 05/16/2021 1558 Last data filed at 05/16/2021 1500 Gross per 24 hour  Intake 2287.93 ml  Output 3959 ml  Net -1671.07 ml   Last 3 Weights 05/16/2021 05/15/2021 05/14/2021  Weight (lbs) 273 lb 5.9 oz 283 lb 1.1 oz 283  lb 8.2 oz  Weight (kg) 124 kg 128.4 kg 128.6 kg      Telemetry    Atrial fibrillation 3:30 PM yesterday continuing to 6:15 PM, normal sinus rhythm thereafter- Personally Reviewed  ECG     - Personally Reviewed  Physical Exam   GEN: Intubated sedated Neck: Unable to estimate JVD Cardiac: RRR, no murmurs, rubs, or gallops.  Respiratory: Clear to auscultation bilaterally. GI: Soft, non-distended  MS: No deformity.  Legs wrapped Neuro: Unable to test, sedated, intubated Psych: Unable to test  Labs    High Sensitivity Troponin:   Recent Labs  Lab 05/15/21 1708 05/15/21 1926  TROPONINIHS 338* 305*     Chemistry Recent Labs  Lab 05/12/21 1025 05/12/21 1603 05/13/21 0311 05/13/21 1650 05/14/21 0410 05/14/21 1555 05/15/21 0313 05/15/21 1708 05/16/21 0422  NA 132*    < > 131*   < > 133*   < > 131* 132* 134*  K 4.4   < > 4.0   < > 3.6   < > 3.8 3.3* 3.1*  CL 94*   < > 98   < > 98   < > 98 99 101  CO2 27   < > 26   < > 26   < > 24 21* 22  GLUCOSE 155*   < > 159*   < > 193*   < > 228* 241* 156*  BUN 50*   < > 36*   < > 33*   < > 48* 51* 45*  CREATININE 2.29*   < > 1.77*   < > 1.45*   < > 1.66* 1.43* 1.32*  CALCIUM 7.6*   < > 8.1*   < > 9.1   < > 9.2 9.3 9.6  MG 2.1  --  1.8  --  1.9  --  1.9  --  1.8  PROT 6.3*  --  5.9*  --   --   --  6.6  --   --   ALBUMIN 2.5*   < > 2.4*   2.4*   < > 3.0*   < > 2.6*   2.6* 2.4* 2.2*  AST 6,250*  --  4,114*  --   --   --  639*  --   --   ALT 1,711*  --  1,400*  --   --   --  749*  --   --   ALKPHOS 262*  --  266*  --   --   --  328*  --   --   BILITOT 3.9*  --  4.0*  --   --   --  5.0*  --   --   GFRNONAA 22*   < > 30*   < > 38*   < > 33* 39* 43*  ANIONGAP 11   < > 7   < > 9   < > '9 12 11   ' < > = values in this interval not displayed.    Lipids  Recent Labs  Lab 05/15/21 0313  TRIG 121    Hematology Recent Labs  Lab 05/14/21 0410 05/14/21 1056 05/15/21 0313 05/16/21 0422  WBC 28.4*  --  24.5* 12.6*  RBC 2.37*  --  3.01* 3.33*  HGB 6.7* 8.0* 8.7* 9.5*  HCT 20.5* 23.5* 26.5* 28.6*  MCV 86.5  --  88.0 85.9  MCH 28.3  --  28.9 28.5  MCHC 32.7  --  32.8 33.2  RDW 20.4*  --  19.8* 19.9*  PLT 121*  --  122* 144*   Thyroid  Recent Labs  Lab 05/13/21 0311 05/14/21 0410  TSH 0.196*  --   FREET4  --  0.48*    BNPNo results for input(s): BNP, PROBNP in the last 168 hours.  DDimer No results for input(s): DDIMER in the last 168 hours.   Radiology    DG Chest 1 View  Result Date: 05/15/2021 CLINICAL DATA:  71 year old female with respiratory failure. Sepsis. EXAM: CHEST  1 VIEW COMPARISON:  Portable chest 05/11/2021 and earlier. FINDINGS: Portable AP supine view at 0609 hours. The patient remains mildly rotated to the right. Endotracheal tube tip in good position between the clavicles and carina. Stable  left IJ approach dual lumen vascular catheter. Enteric tube and pH probe in place and appears stable. Right IJ central line tip now projects at the level the right atrium although lung volumes are slightly lower. Stable cardiac size and mediastinal contours. No pneumothorax. No pleural effusion or consolidation. Increased pulmonary vascularity since 05/10/2021. Paucity of bowel gas. No acute osseous abnormality identified. IMPRESSION: 1. Essentially stable lines and tubes. 2. Lower lung volumes with increased pulmonary vascularity. Consider mild or developing interstitial edema. No pleural effusion or consolidation. Electronically Signed   By: Genevie Ann M.D.   On: 05/15/2021 06:46   DG Abd 1 View  Result Date: 05/15/2021 CLINICAL DATA:  71 year old female with respiratory failure. Sepsis. EXAM: ABDOMEN - 1 VIEW COMPARISON:  05/11/2021. FINDINGS: Portable AP supine views at 0603 hours. Enteric tube terminates in the gastric body. Side hole appears to be inside the stomach. There is mild to moderate gastric distention with air. A small volume of fundal oral contrast persists. Paucity of bowel gas elsewhere in the visible abdomen and pelvis. Left hemipelvis phlebolith. No acute osseous abnormality identified. IMPRESSION: 1. Satisfactory enteric tube termination in the stomach. Mild to moderate gastric distention with air. Small volume oral contrast suspected in the gastric fundus. 2. Paucity of bowel gas elsewhere in the abdomen and pelvis. Electronically Signed   By: Genevie Ann M.D.   On: 05/15/2021 06:47   MR ANGIO HEAD WO CONTRAST  Result Date: 05/15/2021 CLINICAL DATA:  Subarachnoid hemorrhage EXAM: MRA HEAD WITHOUT CONTRAST TECHNIQUE: Angiographic images of the Circle of Willis were acquired using MRA technique without intravenous contrast. COMPARISON:  No prior MRA, correlation is made with MRI 05/14/2021 FINDINGS: Anterior circulation: Both internal carotid arteries are patent to the termini, without  significant stenosis. A1 segments patent. Normal anterior communicating artery. Anterior cerebral arteries are patent to their distal aspects. No M1 stenosis or occlusion. Normal MCA bifurcations. Distal MCA branches perfused and symmetric. Posterior circulation: Vertebral arteries patent to the vertebrobasilar junction without stenosis. Basilar patent to its distal aspect. Superior cerebellar arteries patent bilaterally. Patent P1 segments. PCAs perfused to their distal aspects without stenosis. The bilateral posterior communicating arteries are patent. Anatomic variants: None significant Other: None. IMPRESSION: No intracranial large vessel occlusion or significant stenosis. No aneurysm or other vascular abnormality. Electronically Signed   By: Merilyn Baba M.D.   On: 05/15/2021 02:43   ECHOCARDIOGRAM COMPLETE  Result Date: 05/16/2021    ECHOCARDIOGRAM REPORT   Patient Name:   Alexandra Foster Date of Exam: 05/16/2021 Medical Rec #:  [id number]           Height:       65.0 in Accession #:    [id number]          Weight:  273.4 lb Date of Birth:  09-16-1950           BSA:          2.259 m Patient Age:    23 years            BP:           115/50 mmHg Patient Gender: F                   HR:           75 bpm. Exam Location:  [facility] Procedure: 2D Echo, Color Doppler and Cardiac Doppler Indications:     R94.31 Abnormal ECG  History:         Patient has prior history of Echocardiogram examinations, most                  recent 05/06/2021. COPD; Risk Factors:Diabetes and Hypertension.  Sonographer:     Charmayne Sheer Referring Phys:  [id number] Flora Lipps Diagnosing Phys: Ida Rogue MD  Sonographer Comments: Echo performed with patient supine and on artificial respirator and Technically difficult study due to poor echo windows. Image acquisition challenging due to patient body habitus and Image acquisition challenging due to COPD. IMPRESSIONS  1. Left ventricular ejection fraction, by estimation, is 60 to 65%. The left  ventricle has normal function. The left ventricle has no regional wall motion abnormalities. There is mild left ventricular hypertrophy. Left ventricular diastolic parameters are consistent with Grade I diastolic dysfunction (impaired relaxation).  2. Right ventricular systolic function is normal. The right ventricular size is normal. There is mildly elevated pulmonary artery systolic pressure. The estimated right ventricular systolic pressure is 95.6 mmHg.  3. The mitral valve is normal in structure. No evidence of mitral valve regurgitation. No evidence of mitral stenosis.  4. The aortic valve is normal in structure. Aortic valve regurgitation is not visualized. No aortic stenosis is present.  5. The inferior vena cava is normal in size with greater than 50% respiratory variability, suggesting right atrial pressure of 3 mmHg. FINDINGS  Left Ventricle: Left ventricular ejection fraction, by estimation, is 60 to 65%. The left ventricle has normal function. The left ventricle has no regional wall motion abnormalities. The left ventricular internal cavity size was normal in size. There is  mild left ventricular hypertrophy. Left ventricular diastolic parameters are consistent with Grade I diastolic dysfunction (impaired relaxation). Right Ventricle: The right ventricular size is normal. No increase in right ventricular wall thickness. Right ventricular systolic function is normal. There is mildly elevated pulmonary artery systolic pressure. The tricuspid regurgitant velocity is 3.16  m/s, and with an assumed right atrial pressure of 5 mmHg, the estimated right ventricular systolic pressure is 21.3 mmHg. Left Atrium: Left atrial size was normal in size. Right Atrium: Right atrial size was normal in size. Pericardium: There is no evidence of pericardial effusion. Mitral Valve: The mitral valve is normal in structure. No evidence of mitral valve regurgitation. No evidence of mitral valve stenosis. MV peak gradient, 3.7  mmHg. The mean mitral valve gradient is 2.0 mmHg. Tricuspid Valve: The tricuspid valve is normal in structure. Tricuspid valve regurgitation is mild . No evidence of tricuspid stenosis. Aortic Valve: The aortic valve is normal in structure. Aortic valve regurgitation is not visualized. No aortic stenosis is present. Aortic valve mean gradient measures 6.0 mmHg. Aortic valve peak gradient measures 9.9 mmHg. Aortic valve area, by VTI measures 1.95 cm. Pulmonic Valve: The pulmonic valve was normal in structure.  Pulmonic valve regurgitation is mild. No evidence of pulmonic stenosis. Aorta: The aortic root is normal in size and structure. Venous: The inferior vena cava is normal in size with greater than 50% respiratory variability, suggesting right atrial pressure of 3 mmHg. IAS/Shunts: No atrial level shunt detected by color flow Doppler.  LEFT VENTRICLE PLAX 2D LVIDd:         4.57 cm   Diastology LVIDs:         3.74 cm   LV e' medial:    5.66 cm/s LV PW:         1.01 cm   LV E/e' medial:  10.6 LV IVS:        0.73 cm   LV e' lateral:   4.13 cm/s LVOT diam:     1.80 cm   LV E/e' lateral: 14.6 LV SV:         47 LV SV Index:   21 LVOT Area:     2.54 cm  LEFT ATRIUM           Index LA diam:      3.20 cm 1.42 cm/m LA Vol (A4C): 42.8 ml 18.95 ml/m  AORTIC VALVE                     PULMONIC VALVE AV Area (Vmax):    2.07 cm      PV Vmax:          1.32 m/s AV Area (Vmean):   1.83 cm      PV Vmean:         97.400 cm/s AV Area (VTI):     1.95 cm      PV VTI:           0.208 m AV Vmax:           157.00 cm/s   PV Peak grad:     7.0 mmHg AV Vmean:          108.000 cm/s  PV Mean grad:     4.0 mmHg AV VTI:            0.240 m       PR End Diast Vel: 6.66 msec AV Peak Grad:      9.9 mmHg AV Mean Grad:      6.0 mmHg LVOT Vmax:         128.00 cm/s LVOT Vmean:        77.800 cm/s LVOT VTI:          0.184 m LVOT/AV VTI ratio: 0.77  AORTA Ao Root diam: 2.60 cm MITRAL VALVE               TRICUSPID VALVE MV Area (PHT): 3.27 cm    TR  Peak grad:   39.9 mmHg MV Area VTI:   1.81 cm    TR Vmax:        316.00 cm/s MV Peak grad:  3.7 mmHg MV Mean grad:  2.0 mmHg    SHUNTS MV Vmax:       0.96 m/s    Systemic VTI:  0.18 m MV Vmean:      56.7 cm/s   Systemic Diam: 1.80 cm MV Decel Time: 232 msec MV E velocity: 60.10 cm/s MV A velocity: 67.90 cm/s MV E/A ratio:  0.89 Ida Rogue MD Electronically signed by Ida Rogue MD Signature Date/Time: 05/16/2021/3:38:58 PM    Final     Cardiac Studies   Echocardiogram performed today showing  normal LV function, normal RV function, no significant valvular heart disease, mild to moderately elevated right heart pressure  Patient Profile     Alexandra Foster is a 71 y.o. female with a history of asthma/COPD, HTN, DMII, obesity, OSA on CPAP, GERD, and COVID-19 pneumonia, who is being seen today for the evaluation of afib w/ RVR in the setting of admission for lower extremity cellulitis, S pyogenes bacteremia, shock, and AKI   Assessment & Plan    1.  Afib w/ RVR:   -Has had paroxysmal atrial fibrillation since admission though had been relatively well controlled on IV amiodarone -Amiodarone held yesterday in the setting of bradycardia though with recurrent atrial fibrillation has been restarted, now maintaining normal sinus rhythm as of 6 PM --Would continue amiodarone infusion at low rate given high risk of recurrent arrhythmia -Unable to use other rhythm controlling agents given hypotension -On heparin infusion CHA2DS2VASc = 4.   -Receiving heparin infusion through CRRT -Normal ejection fraction on echo  2.  Septic shock/S pyogenes bacteremia/L lower extremity cellulitis:   Continued hypotension  Has had radical debridement January 24, January 27, wound VAC in place Plan to apply extracellular matrix per surgery  3.  AKI:/ATN Oliguric ATN in the setting of sepsis /shock requiring CRRT   4.  DMII:   per IM/CCM.   5.  Normocytic anemia:   Slow improvement, hemoglobin 9.5 up from  7.33 days ago  6.  Bradycardia Likely in the setting of sedation For recurrent bradycardia would hold sedation, amiodarone until rate improves then consider restarting amiodarone infusion at low rate   Case discussed with nursing Critically ill, very complex, multiorgan system failure  Total encounter time more than 60 minutes  Greater than 50% was spent in counseling and coordination of care with the patient   For questions or updates, please contact [place] Please consult [email or web address] for contact info under        Signed, Ida Rogue, MD  05/16/2021, 3:58 PM

## 2021-05-16 NOTE — Plan of Care (Signed)
°  Problem: Education: Goal: Knowledge of General Education information will improve Description: Including pain rating scale, medication(s)/side effects and non-pharmacologic comfort measures Outcome: Progressing   Problem: Health Behavior/Discharge Planning: Goal: Ability to manage health-related needs will improve Outcome: Progressing   Problem: Clinical Measurements: Goal: Ability to maintain clinical measurements within normal limits will improve Outcome: Progressing Goal: Will remain free from infection Outcome: Progressing Goal: Diagnostic test results will improve Outcome: Progressing   Problem: Pain Managment: Goal: General experience of comfort will improve Outcome: Progressing   Problem: Safety: Goal: Ability to remain free from injury will improve Outcome: Progressing   Problem: Skin Integrity: Goal: Risk for impaired skin integrity will decrease Outcome: Progressing   Problem: Activity: Goal: Ability to tolerate increased activity will improve Outcome: Progressing   Problem: Respiratory: Goal: Ability to maintain a clear airway and adequate ventilation will improve Outcome: Progressing   Problem: Role Relationship: Goal: Method of communication will improve Outcome: Progressing   Problem: Education: Goal: Knowledge of disease and its progression will improve Outcome: Progressing   Problem: Clinical Measurements: Goal: Complications related to the disease process or treatment will be avoided or minimized Outcome: Progressing Goal: Dialysis access will remain free of complications Outcome: Progressing   Problem: Fluid Volume: Goal: Fluid volume balance will be maintained or improved Outcome: Progressing   Problem: Urinary Elimination: Goal: Progression of disease will be identified and treated Outcome: Progressing

## 2021-05-16 NOTE — Progress Notes (Signed)
[facility]  ROUNDING NOTE   Subjective:  Patient remains critically ill. Case discussed with nursing. We will switch the patient to a 4K bath.    Objective:  Vital signs in last 24 hours:  Temp:  [95.4 F (35.2 C)-98.6 F (37 C)] 97.7 F (36.5 C) (02/02 0645) Pulse Rate:  [30-151] 79 (02/02 0645) Resp:  [16-36] 28 (02/02 0645) BP: (72-151)/(42-131) 123/47 (02/02 0645) SpO2:  [99 %-100 %] 100 % (02/02 0645) FiO2 (%):  [35 %-40 %] 35 % (02/02 0310) Weight:  [329 kg] 124 kg (02/02 0500)  Weight change: -4.4 kg Filed Weights   05/14/21 0433 05/15/21 0452 05/16/21 0500  Weight: 128.6 kg 128.4 kg 124 kg    Intake/Output: I/O last 3 completed shifts: In: 2969.1 [I.V.:1193.8; Other:45.3; NG/GT:1630; IV Piggyback:100] Out: 5188 [Urine:15; Drains:245; CZYSA:6301]   Intake/Output this shift:  No intake/output data recorded.  Physical Exam: General: Critically ill-appearing  Head: Normocephalic, atraumatic.  Endotracheal tube in place  Eyes: Anicteric  Neck: Supple  Lungs:  Scattered rhonchi bilateral, normal effort  Heart: S1S2 no rubs  Abdomen:  Soft, nontender, bowel sounds present  Extremities: Left lower extremity wound VAC in place.  1+ right lower extremity edema  Neurologic: Intubated  Skin: No acute rash  Access: Left IJ temporary dialysis catheter    Basic Metabolic Panel: Recent Labs  Lab 05/12/21 1025 05/12/21 1603 05/13/21 0311 05/13/21 1650 05/14/21 0410 05/14/21 1555 05/15/21 0313 05/15/21 1708 05/16/21 0422  NA 132*   < > 131*   < > 133* 129* 131* 132* 134*  K 4.4   < > 4.0   < > 3.6 3.9 3.8 3.3* 3.1*  CL 94*   < > 98   < > 98 96* 98 99 101  CO2 27   < > 26   < > '26 23 24 ' 21* 22  GLUCOSE 155*   < > 159*   < > 193* 275* 228* 241* 156*  BUN 50*   < > 36*   < > 33* 39* 48* 51* 45*  CREATININE 2.29*   < > 1.77*   < > 1.45* 1.75* 1.66* 1.43* 1.32*  CALCIUM 7.6*   < > 8.1*   < > 9.1 9.2 9.2 9.3 9.6  MG 2.1  --  1.8  --  1.9  --  1.9   --  1.8  PHOS 5.8*   < > 3.4   < > 2.3* 2.6 2.3* 2.6 2.4*   < > = values in this interval not displayed.     Liver Function Tests: Recent Labs  Lab 05/11/21 0531 05/11/21 1600 05/12/21 0431 05/12/21 1025 05/12/21 1603 05/13/21 0311 05/13/21 1650 05/14/21 0410 05/14/21 1555 05/15/21 0313 05/15/21 1708 05/16/21 0422  AST 4,359*  --  6,519* 6,250*  --  4,114*  --   --   --  639*  --   --   ALT 1,746*  --  1,613* 1,711*  --  1,400*  --   --   --  749*  --   --   ALKPHOS 185*  --  232* 262*  --  266*  --   --   --  328*  --   --   BILITOT 2.5*  --  3.0* 3.9*  --  4.0*  --   --   --  5.0*  --   --   PROT 4.6*  --  5.3* 6.3*  --  5.9*  --   --   --  6.6  --   --   ALBUMIN <1.5*   < > 2.1*   2.0* 2.5*   < > 2.4*   2.4*   < > 3.0* 2.9* 2.6*   2.6* 2.4* 2.2*   < > = values in this interval not displayed.    No results for input(s): LIPASE, AMYLASE in the last 168 hours. No results for input(s): AMMONIA in the last 168 hours.  CBC: Recent Labs  Lab 05/11/21 0531 05/11/21 1239 05/12/21 1025 05/13/21 0311 05/13/21 1650 05/14/21 0410 05/14/21 1056 05/15/21 0313 05/16/21 0422  WBC 42.8*  --  40.8* 34.8*  --  28.4*  --  24.5* 12.6*  NEUTROABS 34.4*  --   --   --   --   --   --  22.7*  --   HGB 10.8*   < > 8.2* 7.3* 7.6* 6.7* 8.0* 8.7* 9.5*  HCT 33.2*   < > 24.5* 22.2* 22.4* 20.5* 23.5* 26.5* 28.6*  MCV 88.3  --  85.7 87.7  --  86.5  --  88.0 85.9  PLT 219  --  187 141*  --  121*  --  122* 144*   < > = values in this interval not displayed.     Cardiac Enzymes: Recent Labs  Lab 05/12/21 1603  CKTOTAL 4,670*     BNP: Invalid input(s): POCBNP  CBG: Recent Labs  Lab 05/15/21 1617 05/15/21 1917 05/15/21 2330 05/16/21 0317 05/16/21 0721  GLUCAP 242* 198* 163* 140* 147*     Microbiology: Results for orders placed or performed during the hospital encounter of 05/04/21  Blood Culture (routine x 2)     Status: Abnormal   Collection Time: 05/04/21  7:43 PM    Specimen: BLOOD  Result Value Ref Range Status   Specimen Description   Final    BLOOD RIGHT ANTECUBITAL Performed at [facility], [place] [address] [place], [place] [address]    Special Requests   Final    BOTTLES DRAWN AEROBIC AND ANAEROBIC Blood Culture adequate volume Performed at [facility], [address] [place], [place] [address]    Culture  Setup Time   Final    GRAM POSITIVE COCCI IN BOTH AEROBIC AND ANAEROBIC BOTTLES RESULT CALLED TO, READ BACK BY AND VERIFIED WITH: Nilsa Nutting 05/05/21 @ 1007 BY SB    Culture (A)  Final    GROUP A STREP (S.PYOGENES) ISOLATED HEALTH DEPARTMENT NOTIFIED Performed at [facility], [place] [address] [place], [place] [address]    Report Status 05/07/2021 FINAL  Final   Organism ID, Bacteria GROUP A STREP (S.PYOGENES) ISOLATED  Final      Susceptibility   Group a strep (s.pyogenes) isolated - MIC*    PENICILLIN <=0.06 SENSITIVE Sensitive     CEFTRIAXONE <=0.12 SENSITIVE Sensitive     ERYTHROMYCIN <=0.12 SENSITIVE Sensitive     LEVOFLOXACIN 1 SENSITIVE Sensitive     VANCOMYCIN 0.5 SENSITIVE Sensitive     * GROUP A STREP (S.PYOGENES) ISOLATED  Blood Culture ID Panel (Reflexed)     Status: Abnormal   Collection Time: 05/04/21  7:43 PM  Result Value Ref Range Status   Enterococcus faecalis NOT DETECTED NOT DETECTED Final   Enterococcus Faecium NOT DETECTED NOT DETECTED Final   Listeria monocytogenes NOT DETECTED NOT DETECTED Final   Staphylococcus species NOT DETECTED NOT DETECTED Final   Staphylococcus aureus (BCID) NOT DETECTED NOT DETECTED Final   Staphylococcus epidermidis NOT DETECTED NOT DETECTED Final  Staphylococcus lugdunensis NOT DETECTED NOT DETECTED Final   Streptococcus species DETECTED (A) NOT DETECTED Final    Comment: RESULT CALLED TO, READ BACK BY AND VERIFIED WITH: Nilsa Nutting 05/05/21 @ 1007 BY SB    Streptococcus agalactiae NOT DETECTED NOT DETECTED Final   Streptococcus pneumoniae NOT  DETECTED NOT DETECTED Final   Streptococcus pyogenes DETECTED (A) NOT DETECTED Final    Comment: RESULT CALLED TO, READ BACK BY AND VERIFIED WITH: Nilsa Nutting 05/05/21 @ 1007 BY SB    A.calcoaceticus-baumannii NOT DETECTED NOT DETECTED Final   Bacteroides fragilis NOT DETECTED NOT DETECTED Final   Enterobacterales NOT DETECTED NOT DETECTED Final   Enterobacter cloacae complex NOT DETECTED NOT DETECTED Final   Escherichia coli NOT DETECTED NOT DETECTED Final   Klebsiella aerogenes NOT DETECTED NOT DETECTED Final   Klebsiella oxytoca NOT DETECTED NOT DETECTED Final   Klebsiella pneumoniae NOT DETECTED NOT DETECTED Final   Proteus species NOT DETECTED NOT DETECTED Final   Salmonella species NOT DETECTED NOT DETECTED Final   Serratia marcescens NOT DETECTED NOT DETECTED Final   Haemophilus influenzae NOT DETECTED NOT DETECTED Final   Neisseria meningitidis NOT DETECTED NOT DETECTED Final   Pseudomonas aeruginosa NOT DETECTED NOT DETECTED Final   Stenotrophomonas maltophilia NOT DETECTED NOT DETECTED Final   Candida albicans NOT DETECTED NOT DETECTED Final   Candida auris NOT DETECTED NOT DETECTED Final   Candida glabrata NOT DETECTED NOT DETECTED Final   Candida krusei NOT DETECTED NOT DETECTED Final   Candida parapsilosis NOT DETECTED NOT DETECTED Final   Candida tropicalis NOT DETECTED NOT DETECTED Final   Cryptococcus neoformans/gattii NOT DETECTED NOT DETECTED Final    Comment: Performed at [facility], [address] [place], [place] [address]  Blood Culture (routine x 2)     Status: Abnormal   Collection Time: 05/04/21  9:29 PM   Specimen: BLOOD  Result Value Ref Range Status   Specimen Description   Final    BLOOD RIGHT ANTECUBITAL Performed at [facility], [address] [place], [place] [address]    Special Requests   Final    BOTTLES DRAWN AEROBIC AND ANAEROBIC Blood Culture adequate volume Performed at [facility], [address] [place], [place] [address]    Culture  Setup Time   Final    GRAM POSITIVE COCCI IN BOTH AEROBIC AND ANAEROBIC BOTTLES CRITICAL VALUE NOTED.  VALUE IS CONSISTENT WITH PREVIOUSLY REPORTED AND CALLED VALUE. Performed at [facility], [address], [place], [place] [address]    Culture (A)  Final    GROUP A STREP (S.PYOGENES) ISOLATED HEALTH DEPARTMENT NOTIFIED SUSCEPTIBILITIES PERFORMED ON PREVIOUS CULTURE WITHIN THE LAST 5 DAYS. STAPHYLOCOCCUS EPIDERMIDIS THE SIGNIFICANCE OF ISOLATING THIS ORGANISM FROM A SINGLE SET OF BLOOD CULTURES WHEN MULTIPLE SETS ARE DRAWN IS UNCERTAIN. PLEASE NOTIFY THE MICROBIOLOGY DEPARTMENT WITHIN ONE WEEK IF SPECIATION AND SENSITIVITIES ARE REQUIRED. Performed at [facility], [place] [address], [place], [place] [address]    Report Status 05/08/2021 FINAL  Final  Resp Panel by RT-PCR (Flu A&B, Covid) Nasopharyngeal Swab     Status: None   Collection Time: 05/04/21  9:50 PM   Specimen: Nasopharyngeal Swab; Nasopharyngeal(NP) swabs in vial transport medium  Result Value Ref Range Status   SARS Coronavirus 2 by RT PCR NEGATIVE NEGATIVE Final    Comment: (NOTE) SARS-CoV-2 target nucleic acids are NOT DETECTED.  The SARS-CoV-2 RNA is generally detectable in upper respiratory specimens during the acute phase of  infection. The lowest concentration of SARS-CoV-2 viral copies this assay can detect is 138 copies/mL. A negative result does not preclude SARS-Cov-2 infection and should not be used as the sole basis for treatment or other patient management decisions. A negative result may occur with  improper specimen collection/handling, submission of specimen other than nasopharyngeal swab, presence of viral mutation(s) within the areas targeted by this assay, and inadequate number of viral copies(<138 copies/mL). A negative result must be combined with clinical observations, patient history, and epidemiological information. The expected result is  Negative.  Fact Sheet for Patients:  EntrepreneurPulse.com.au  Fact Sheet for Healthcare Providers:  IncredibleEmployment.be  This test is no t yet approved or cleared by the [place] FDA and  has been authorized for detection and/or diagnosis of SARS-CoV-2 by FDA under an Emergency Use Authorization (EUA). This EUA will remain  in effect (meaning this test can be used) for the duration of the COVID-19 declaration under Section 564(b)(1) of the Act, 21 U.S.C.section 360bbb-3(b)(1), unless the authorization is terminated  or revoked sooner.       Influenza A by PCR NEGATIVE NEGATIVE Final   Influenza B by PCR NEGATIVE NEGATIVE Final    Comment: (NOTE) The Xpert Xpress SARS-CoV-2/FLU/RSV plus assay is intended as an aid in the diagnosis of influenza from Nasopharyngeal swab specimens and should not be used as a sole basis for treatment. Nasal washings and aspirates are unacceptable for Xpert Xpress SARS-CoV-2/FLU/RSV testing.  Fact Sheet for Patients: EntrepreneurPulse.com.au  Fact Sheet for Healthcare Providers: IncredibleEmployment.be  This test is not yet approved or cleared by the [place] FDA and has been authorized for detection and/or diagnosis of SARS-CoV-2 by FDA under an Emergency Use Authorization (EUA). This EUA will remain in effect (meaning this test can be used) for the duration of the COVID-19 declaration under Section 564(b)(1) of the Act, 21 U.S.C. section 360bbb-3(b)(1), unless the authorization is terminated or revoked.  Performed at [facility], [address] [place], [place] [address]   MRSA Next Gen by PCR, Nasal     Status: None   Collection Time: 05/05/21 11:02 AM   Specimen: Nasal Mucosa; Nasal Swab  Result Value Ref Range Status   MRSA by PCR Next Gen NOT DETECTED NOT DETECTED Final    Comment: (NOTE) The GeneXpert MRSA Assay (FDA approved for NASAL  specimens only), is one component of a comprehensive MRSA colonization surveillance program. It is not intended to diagnose MRSA infection nor to guide or monitor treatment for MRSA infections. Test performance is not FDA approved in patients less than 19 years old. Performed at [facility], [address], [place], [place] [address]   Urine Culture     Status: None   Collection Time: 05/07/21  2:30 PM   Specimen: Urine, Random  Result Value Ref Range Status   Specimen Description   Final    URINE, RANDOM Performed at [facility], [address] [place], [place] [address]    Special Requests   Final    NONE Performed at [facility], [address] [place], [place] [address]    Culture   Final    NO GROWTH Performed at [facility], [place] [address], [place], [place] [address]    Report Status 05/09/2021 FINAL  Final  Aerobic/Anaerobic Culture w Gram Stain (surgical/deep wound)     Status: None   Collection Time: 05/07/21  9:35 PM   Specimen: PATH Other; Tissue  Result Value  Ref Range Status   Specimen Description   Final    LEG LEFT Performed at [facility], [address] [place], [place] [address]    Special Requests PT PREVIOUSLY ON CLINDAMYCIN,PENICILLIN  Final   Gram Stain   Final    RARE WBC PRESENT,BOTH PMN AND MONONUCLEAR NO ORGANISMS SEEN    Culture   Final    No growth aerobically or anaerobically. Performed at [facility], [place] [address], [place], [place] [address]    Report Status 05/13/2021 FINAL  Final  CULTURE, BLOOD (ROUTINE X 2) w Reflex to ID Panel     Status: None   Collection Time: 05/09/21  7:13 AM   Specimen: BLOOD  Result Value Ref Range Status   Specimen Description BLOOD BLOOD LEFT HAND  Final   Special Requests   Final    BOTTLES DRAWN AEROBIC AND ANAEROBIC Blood Culture adequate volume   Culture   Final    NO GROWTH 5 DAYS Performed at [facility], [address]  [place], [place] [address]    Report Status 05/14/2021 FINAL  Final  CULTURE, BLOOD (ROUTINE X 2) w Reflex to ID Panel     Status: None   Collection Time: 05/09/21  7:23 AM   Specimen: BLOOD  Result Value Ref Range Status   Specimen Description BLOOD LEFT THUMB  Final   Special Requests   Final    BOTTLES DRAWN AEROBIC AND ANAEROBIC Blood Culture results may not be optimal due to an inadequate volume of blood received in culture bottles   Culture   Final    NO GROWTH 5 DAYS Performed at [facility], [address] [place], [place] [address]    Report Status 05/14/2021 FINAL  Final    Coagulation Studies: Recent Labs    05/14/21 1056 05/16/21 0422  LABPROT 21.0* 15.7*  INR 1.8* 1.3*     Urinalysis: No results for input(s): COLORURINE, LABSPEC, PHURINE, GLUCOSEU, HGBUR, BILIRUBINUR, KETONESUR, PROTEINUR, UROBILINOGEN, NITRITE, LEUKOCYTESUR in the last 72 hours.  Invalid input(s): APPERANCEUR    Imaging: DG Chest 1 View  Result Date: 05/15/2021 CLINICAL DATA:  71 year old female with respiratory failure. Sepsis. EXAM: CHEST  1 VIEW COMPARISON:  Portable chest 05/11/2021 and earlier. FINDINGS: Portable AP supine view at 0609 hours. The patient remains mildly rotated to the right. Endotracheal tube tip in good position between the clavicles and carina. Stable left IJ approach dual lumen vascular catheter. Enteric tube and pH probe in place and appears stable. Right IJ central line tip now projects at the level the right atrium although lung volumes are slightly lower. Stable cardiac size and mediastinal contours. No pneumothorax. No pleural effusion or consolidation. Increased pulmonary vascularity since 05/10/2021. Paucity of bowel gas. No acute osseous abnormality identified. IMPRESSION: 1. Essentially stable lines and tubes. 2. Lower lung volumes with increased pulmonary vascularity. Consider mild or developing interstitial edema. No pleural effusion or consolidation.  Electronically Signed   By: Genevie Ann M.D.   On: 05/15/2021 06:46   DG Abd 1 View  Result Date: 05/15/2021 CLINICAL DATA:  71 year old female with respiratory failure. Sepsis. EXAM: ABDOMEN - 1 VIEW COMPARISON:  05/11/2021. FINDINGS: Portable AP supine views at 0603 hours. Enteric tube terminates in the gastric body. Side hole appears to be inside the stomach. There is mild to moderate gastric distention with air. A small volume of fundal oral contrast persists. Paucity of bowel gas elsewhere in the visible abdomen and pelvis. Left hemipelvis  phlebolith. No acute osseous abnormality identified. IMPRESSION: 1. Satisfactory enteric tube termination in the stomach. Mild to moderate gastric distention with air. Small volume oral contrast suspected in the gastric fundus. 2. Paucity of bowel gas elsewhere in the abdomen and pelvis. Electronically Signed   By: Genevie Ann M.D.   On: 05/15/2021 06:47   CT HEAD WO CONTRAST (5MM)  Addendum Date: 05/14/2021   ADDENDUM REPORT: 05/14/2021 16:47 ADDENDUM: These results will be called to the ordering clinician or representative by the Radiologist Assistant, and communication documented in the PACS or [employer]. Electronically Signed   By: Franchot Gallo M.D.   On: 05/14/2021 16:47   Result Date: 05/14/2021 CLINICAL DATA:  Subarachnoid hemorrhage.  Sepsis. EXAM: CT HEAD WITHOUT CONTRAST TECHNIQUE: Contiguous axial images were obtained from the base of the skull through the vertex without intravenous contrast. RADIATION DOSE REDUCTION: This exam was performed according to the departmental dose-optimization program which includes automated exposure control, adjustment of the mA and/or kV according to patient size and/or use of iterative reconstruction technique. COMPARISON:  MRI head 05/14/2021 FINDINGS: Brain: Hyperdensity in the left middle frontal sulcus. This corresponds to the FLAIR hyperintensity on MRI and is most consistent with small volume acute subarachnoid  hemorrhage on the left. No other areas of high-density hemorrhage. Ventricle size normal.  Negative for acute infarct or mass. Empty sella with enlargement of the sella filled with CSF. Small pituitary. Vascular: Negative for hyperdense vessel Skull: Negative Sinuses/Orbits: Extensive mucosal edema paranasal sinuses. Probable air-fluid levels in the maxillary sinus bilaterally. Bilateral proptosis without orbital mass. Other: None IMPRESSION: Hyperintensity left middle frontal sulcus compatible with acute subarachnoid hemorrhage, small volume. No other acute intracranial abnormality. No hydrocephalus or infarct. Extensive paranasal sinus mucosal disease with air-fluid levels. Electronically Signed: By: Franchot Gallo M.D. On: 05/14/2021 15:59   MR ANGIO HEAD WO CONTRAST  Result Date: 05/15/2021 CLINICAL DATA:  Subarachnoid hemorrhage EXAM: MRA HEAD WITHOUT CONTRAST TECHNIQUE: Angiographic images of the Circle of Willis were acquired using MRA technique without intravenous contrast. COMPARISON:  No prior MRA, correlation is made with MRI 05/14/2021 FINDINGS: Anterior circulation: Both internal carotid arteries are patent to the termini, without significant stenosis. A1 segments patent. Normal anterior communicating artery. Anterior cerebral arteries are patent to their distal aspects. No M1 stenosis or occlusion. Normal MCA bifurcations. Distal MCA branches perfused and symmetric. Posterior circulation: Vertebral arteries patent to the vertebrobasilar junction without stenosis. Basilar patent to its distal aspect. Superior cerebellar arteries patent bilaterally. Patent P1 segments. PCAs perfused to their distal aspects without stenosis. The bilateral posterior communicating arteries are patent. Anatomic variants: None significant Other: None. IMPRESSION: No intracranial large vessel occlusion or significant stenosis. No aneurysm or other vascular abnormality. Electronically Signed   By: Merilyn Baba M.D.   On:  05/15/2021 02:43   MR BRAIN WO CONTRAST  Result Date: 05/14/2021 CLINICAL DATA:  Altered mental status, septic shock secondary to left lower extremity cellulitis EXAM: MRI HEAD WITHOUT CONTRAST TECHNIQUE: Multiplanar, multiecho pulse sequences of the brain and surrounding structures were obtained without intravenous contrast. COMPARISON:  Brain MRI 08/10/2013 FINDINGS: Brain: There is no evidence of acute infarct. There is sulcal FLAIR hyperintensity with trace SWI signal dropout overlying a left frontal lobe sulcus and the right superior frontal sulcus (15-35, 15-39). There is no other evidence of acute intracranial hemorrhage or extra-axial fluid collection. Parenchymal volume is normal. The ventricles are normal in size. Parenchymal signal is normal, with no significant burden of white matter microangiopathic  change. A partially empty sella is noted. There is no mass lesion. There is no midline shift. Vascular: Normal flow voids. Skull and upper cervical spine: Normal marrow signal. Sinuses/Orbits: There is mucosal thickening throughout the paranasal sinuses with layering fluid in the maxillary sinuses and complete opacification of the mastoid air cells, likely at least partially related to intubation. The globes and orbits are unremarkable. Other: There is scalp fluid predominantly in the bilateral occipital regions. 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 IMPRESSION: 1. Sulcal FLAIR hyperintensity overlying bilateral frontal lobe sulci may be artifactual related to intubation; however, subarachnoid hemorrhage can have a similar appearance. Differential also includes meningitis. Recommend noncontrast CT for further evaluation for subarachnoid hemorrhage. Consider correlation with lumbar puncture to evaluate for meningitis as indicated, if the CT head is negative for hemorrhage. 2. Fluid in the bilateral occipital scalp. These results were called by telephone at the time of interpretation on 05/14/2021  at 3:10 pm to provider DANA GRAVES NP, who verbally acknowledged these results. Electronically Signed   By: Valetta Mole M.D.   On: 05/14/2021 15:11     Medications:     prismasol BGK 4/2.5      prismasol BGK 4/2.5     sodium chloride Stopped (05/13/21 2154)   amiodarone 30 mg/hr (05/16/21 0159)   cefTRIAXone (ROCEPHIN)  IV Stopped (05/15/21 1642)   feeding supplement (VITAL AF 1.2 CAL) 40 mL/hr at 05/15/21 0400   magnesium sulfate bolus IVPB     phenylephrine (NEO-SYNEPHRINE) Adult infusion 35 mcg/min (05/16/21 0659)   prismasol BGK 4/2.5     propofol (DIPRIVAN) infusion 5 mcg/kg/min (05/16/21 0659)    amiodarone  200 mg Per Tube BID   Followed by   Derrill Memo ON 05/22/2021] amiodarone  200 mg Per Tube Daily   arformoterol  15 mcg Nebulization BID   vitamin C  500 mg Per Tube BID   budesonide (PULMICORT) nebulizer solution  0.25 mg Nebulization BID   chlorhexidine gluconate (MEDLINE KIT)  15 mL Mouth Rinse BID   Chlorhexidine Gluconate Cloth  6 each Topical Q0600   docusate  100 mg Per Tube BID   feeding supplement (PROSource TF)  90 mL Per Tube BID   free water  30 mL Per Tube Q4H   insulin aspart  1-3 Units Subcutaneous Q4H   insulin detemir  38 Units Subcutaneous BID   lactulose  20 g Per Tube BID   levETIRAcetam  1,000 mg Per Tube BID   linezolid  600 mg Per Tube Q12H   magic mouthwash  10 mL Oral QID   mouth rinse  15 mL Mouth Rinse 10 times per day   metoCLOPramide (REGLAN) injection  10 mg Intravenous Q8H   midodrine  10 mg Per Tube TID WC   multivitamin  1 tablet Per Tube QHS   nutrition supplement (JUVEN)  1 packet Per Tube BID BM   pantoprazole (PROTONIX) IV  40 mg Intravenous Q24H   polyethylene glycol  17 g Per Tube Daily   potassium & sodium phosphates  1 packet Per Tube TID WC & HS   revefenacin  175 mcg Nebulization Daily   sodium chloride flush  10-40 mL Intracatheter Q12H   zinc sulfate  220 mg Per Tube Daily   docusate sodium, fentaNYL (SUBLIMAZE) injection,  heparin, lip balm, midazolam, ondansetron (ZOFRAN) IV, oxyCODONE, sennosides, sodium chloride, sodium chloride flush  Assessment/ Plan:  71 y.o. female with past medical history of diabetes mellitus type 2, hypertension, obstructive  sleep apnea on CPAP, GERD, history of COVID-19 pneumonia who came in with left lower extremity pain and chills and subsequently found to have streptococcal pyogenes bacteremia and cellulitis status post surgical debridement with subsequent wound VAC placement.  1.  Acute kidney injury secondary to ATN from septic shock. 02/01 0701 - 02/02 0700 In: 2075.4 [I.V.:760.1; NG/GT:1210; IV Piggyback:100] Out: 9968 [Urine:15; Drains:125] Lab Results  Component Value Date   CREATININE 1.32 (H) 05/16/2021   CREATININE 1.43 (H) 05/15/2021   CREATININE 1.66 (H) 05/15/2021  02/01 0701 - 02/02 0700 In: 2075.4 [I.V.:760.1; NG/GT:1210; IV Piggyback:100] Out: 9570 [Urine:15; Drains:125]  Patient remains quite oliguric.  We will maintain the patient on CRRT in an effort to continue ultrafiltration as the patient has been positive for the admission.  Switch the patient to a 4K bath.  2.  Acute respiratory failure.  Continue to wean the patient from the ventilator.  Tracheostomy placement to occur soon.  3.  Hypotension.  Patient back on phenylephrine.  4.  Left lower extremity deep infection status post surgical debridement and wound VAC placement/Streptococcus pyogenes infection.  Patient transitioned to ceftriaxone.  5.  Hyponatremia.  Sodium improved to 134 today.    LOS: 12 Latrelle Bazar 2/2/20237:35 AM

## 2021-05-16 NOTE — Progress Notes (Signed)
NAME:  Alexandra Foster, MRN:  [id number], DOB:  19-May-1950, LOS: 3 ADMISSION DATE:  05/04/2021  BRIEF SYNOPSIS 71 y.o female with medical history significant of Asthma/COPD, T2DM, HTN, OSA on CPAP, GERD, COVID-19 pneumonia, and Bilateral lower extremity edema who presented to the ED with chief complaint of LLE pain and chills since Thursday.  Patient was given 30 cc/kg of fluids and started on broad-spectrum antibiotics for sepsis with septic shock.   Admitted to the ICU with severe sepsis with shock secondary to cellulitis of LLE septic shock with LLL necrotizing fascitis post op resp failure and severe septic shock with progressive renal failure on CRRT and progressive multiorgan failure  MRI/CT SHOWS SMALL SAH  Past Medical History     Arthritis   Asthma   COPD (chronic obstructive pulmonary disease) ([place])   Diabetes mellitus without complication ([place])   Endometriosis   GERD (gastroesophageal reflux disease)   Hypertension   Obesity   Sleep apnea    CPAP    Significant Hospital Events   05/04/21: Admitted to the ICU with severe sepsis with shock secondary to cellulitis of LLE 1/23 off pressors +Afib with RVR 1/24 blood pressure low  1/24 to OR for fasciotomy 1/25 post op resp failure with severe septic shock 1/26 remains on pressors 1/27 returns to the OR today for debridement, wound VAC 1/28 overnight with hemodynamic deterioration, required reintubation, currently on pressors 1/29 remains on 3 pressors, CRRT, wound VAC and minimal sedation.  Awakens to voice and touch.  Seen by vascular surgery and awaiting input from general surgery regarding amputation of left leg 1/30 remains on vent remains on pressors 1/31 remains on pressors, on vent, on CRRT 2/1 remains on pressors, on CRRT, MRI/CT shows SAH, afib with RVR, ECHO pending, restarted AMIO at 33 2/2 remains on vent plan for trach tomorrow   Significant Diagnostic Tests:  1/21: Chest Xray> no active cardiopulmonary  process 1/21: CT left lower leg>Diffuse subcutaneous edema may represent cellulitis. No drainable fluid collection/abscess 1/21: Ultrasound lower unilateral left> no DVT   Micro Data:  1/21: SARS-CoV-2 PCR> negative 1/21: Influenza PCR> negative 1/21: Blood culture x2> Streptococcus pyogenes 1/21: Urine Culture> negative 1/21: MRSA PCR>> negative   Antimicrobials:  Vancomycin 1/21> off Cefepime 1/21> off Ceftriaxone 1/21 >> off Penicilli G 1/24>> Linezolid 1/25>>  *Antimicrobials per ID    Interim History / Subjective:  Remains critically ill Patient with acute necrotizing fasciitis with emergent fasciotomy Off pressors On CRRT  Returned to the OR 1/27 progressive hemodynamic instability after OR ENT consulted for [facility]  Vent Mode: PRVC FiO2 (%):  [35 %-40 %] 35 % Set Rate:  [20 bmp] 20 bmp Vt Set:  [460 mL] 460 mL PEEP:  [5 cmH20] 5 cmH20     Objective   Blood pressure (!) 123/47, pulse 79, temperature 97.7 F (36.5 C), resp. rate (!) 28, height '5\' 5"'  (1.651 m), weight 124 kg, SpO2 100 %.    Vent Mode: PRVC FiO2 (%):  [35 %-40 %] 35 % Set Rate:  [20 bmp] 20 bmp Vt Set:  [460 mL] 460 mL PEEP:  [5 cmH20] 5 cmH20   Intake/Output Summary (Last 24 hours) at 05/16/2021 0741 Last data filed at 05/16/2021 0700 Gross per 24 hour  Intake 2075.42 ml  Output 4094 ml  Net -2018.58 ml    Filed Weights   05/14/21 0433 05/15/21 0452 05/16/21 0500  Weight: 128.6 kg 128.4 kg 124 kg    REVIEW OF SYSTEMS  PATIENT  IS UNABLE TO PROVIDE COMPLETE REVIEW OF SYSTEMS DUE TO SEVERE CRITICAL ILLNESS AND TOXIC METABOLIC ENCEPHALOPATHY    PHYSICAL EXAMINATION:  GENERAL:critically ill appearing, +resp distress EYES: Pupils equal, round, reactive to light.  No scleral icterus.  MOUTH: Moist mucosal membrane. INTUBATED NECK: Supple.  PULMONARY: +rhonchi, +wheezing CARDIOVASCULAR: S1 and S2.  No murmurs  GASTROINTESTINAL: Soft, nontender, -distended. Positive bowel sounds.   NEUROLOGIC: obtunded MUSCULOSKELETAL: Left lower extremity with wound VAC in place, surgery redressing.  Status post fasciotomy   Assessment & Plan   Admitted to the ICU with severe sepsis with shock secondary to cellulitis of LLE septic shock with LLL necrotizing fascitis post op resp failure and severe septic shock with progressive renal failure on CRRT and progressive multiorgan failure  Severe ACUTE Hypoxic and Hypercapnic Respiratory Failure -continue Mechanical Ventilator support -Wean Fio2 and PEEP as tolerated -VAP/VENT bundle implementation - Wean PEEP & FiO2 as tolerated, maintain SpO2 > 88% - Head of bed elevated 30 degrees, VAP protocol in place - Plateau pressures less than 30 cm H20  - Intermittent chest x-ray & ABG PRN - Ensure adequate pulmonary hygiene  -will NOT perform SAT/SBT when respiratory parameters are met ENT CONSULTED FOR TRACH   Acute Hypoxic Respiratory Failure in the setting of worsening septic shock PMHx: COPD Refractory Septic Shock secondary to LLE necrotizing fasciitis Streptococcal toxic shock syndrome Weaning off pressors now  ACUTE KIDNEY INJURY/Renal Failure -continue Foley Catheter-assess need -Avoid nephrotoxic agents -Follow urine output, BMP -Ensure adequate renal perfusion, optimize oxygenation -Renal dose medications On CRRT   Intake/Output Summary (Last 24 hours) at 05/16/2021 0743 Last data filed at 05/16/2021 0700 Gross per 24 hour  Intake 2075.42 ml  Output 4094 ml  Net -2018.58 ml     SEPTIC shock SOURCE-LLE Refractory Septic Shock secondary to LLE necrotizing fasciitis Streptococcal toxic shock syndrome -use vasopressors to keep MAP>65 as needed S/p IVIG  INFECTIOUS DISEASE -continue antibiotics as prescribed -follow up cultures -follow up ID consultation Group A strep necrotizing fasciitis L LE Streptococcal toxic shock - Continue antibiotics: Penicillin G/linezolid - Status postdebridement, wound VAC per  surgery - Poor prognosis -Vascular and general surgery following S/p IVIG    ENDO - ICU hypoglycemic\Hyperglycemia protocol -check FSBS per protocol   GI GI PROPHYLAXIS as indicated  NUTRITIONAL STATUS DIET-->TF's as tolerated Constipation protocol as indicated   ELECTROLYTES -follow labs as needed -replace as needed -pharmacy consultation and following  Acute encephalopathy  -Toxic metabolic encephalopathy - Loaded with keppra; continue at 1gram bid MRI/CT HEAD SHOWS SMALL VOLUME [place] APPRECIATE NEUROSURGERY RECS -Neurology consulted    Best practice:  Diet: NPO Pain/Anxiety/Delirium protocol (if indicated): Yes, initiated VAP protocol (if indicated): Yes, initiated DVT prophylaxis: Subcutaneous Heparin GI prophylaxis: PPI Glucose control: ICU hyperglycemia protocol, insulin drip discontinued; SQ insulin Central venous access:  Rt IJV Arterial line:  N/A Foley:  Yes Mobility:  bed rest  PT consulted: N/A Last date of multidisciplinary goals of care discussion [1/27] Code Status:  full code Disposition: ICU    Labs   CBC: Recent Labs  Lab 05/11/21 0531 05/11/21 1239 05/12/21 1025 05/13/21 0311 05/13/21 1650 05/14/21 0410 05/14/21 1056 05/15/21 0313 05/16/21 0422  WBC 42.8*  --  40.8* 34.8*  --  28.4*  --  24.5* 12.6*  NEUTROABS 34.4*  --   --   --   --   --   --  22.7*  --   HGB 10.8*   < > 8.2* 7.3* 7.6* 6.7* 8.0* 8.7*  9.5*  HCT 33.2*   < > 24.5* 22.2* 22.4* 20.5* 23.5* 26.5* 28.6*  MCV 88.3  --  85.7 87.7  --  86.5  --  88.0 85.9  PLT 219  --  187 141*  --  121*  --  122* 144*   < > = values in this interval not displayed.     Basic Metabolic Panel: Recent Labs  Lab 05/12/21 1025 05/12/21 1603 05/13/21 0311 05/13/21 1650 05/14/21 0410 05/14/21 1555 05/15/21 0313 05/15/21 1708 05/16/21 0422  NA 132*   < > 131*   < > 133* 129* 131* 132* 134*  K 4.4   < > 4.0   < > 3.6 3.9 3.8 3.3* 3.1*  CL 94*   < > 98   < > 98 96* 98 99 101  CO2  27   < > 26   < > '26 23 24 ' 21* 22  GLUCOSE 155*   < > 159*   < > 193* 275* 228* 241* 156*  BUN 50*   < > 36*   < > 33* 39* 48* 51* 45*  CREATININE 2.29*   < > 1.77*   < > 1.45* 1.75* 1.66* 1.43* 1.32*  CALCIUM 7.6*   < > 8.1*   < > 9.1 9.2 9.2 9.3 9.6  MG 2.1  --  1.8  --  1.9  --  1.9  --  1.8  PHOS 5.8*   < > 3.4   < > 2.3* 2.6 2.3* 2.6 2.4*   < > = values in this interval not displayed.    GFR: Estimated Creatinine Clearance: 51 mL/min (A) (by C-G formula based on SCr of 1.32 mg/dL (H)). Recent Labs  Lab 05/13/21 0311 05/14/21 0410 05/15/21 0313 05/16/21 0422  PROCALCITON 20.94 21.12  --   --   WBC 34.8* 28.4* 24.5* 12.6*     Liver Function Tests: Recent Labs  Lab 05/11/21 0531 05/11/21 1600 05/12/21 0431 05/12/21 1025 05/12/21 1603 05/13/21 0311 05/13/21 1650 05/14/21 0410 05/14/21 1555 05/15/21 0313 05/15/21 1708 05/16/21 0422  AST 4,359*  --  6,519* 6,250*  --  4,114*  --   --   --  639*  --   --   ALT 1,746*  --  1,613* 1,711*  --  1,400*  --   --   --  749*  --   --   ALKPHOS 185*  --  232* 262*  --  266*  --   --   --  328*  --   --   BILITOT 2.5*  --  3.0* 3.9*  --  4.0*  --   --   --  5.0*  --   --   PROT 4.6*  --  5.3* 6.3*  --  5.9*  --   --   --  6.6  --   --   ALBUMIN <1.5*   < > 2.1*   2.0* 2.5*   < > 2.4*   2.4*   < > 3.0* 2.9* 2.6*   2.6* 2.4* 2.2*   < > = values in this interval not displayed.    Coagulation Profile: Recent Labs  Lab 05/14/21 1056 05/16/21 0422  INR 1.8* 1.3*     Cardiac Enzymes: Recent Labs  Lab 05/12/21 1603  CKTOTAL 4,670*     HbA1C: Hgb A1c MFr Bld  Date/Time Value Ref Range Status  05/05/2021 04:53 AM 6.7 (H) 4.8 - 5.6 % Final  Comment:    (NOTE)         Prediabetes: 5.7 - 6.4         Diabetes: >6.4         Glycemic control for adults with diabetes: <7.0   02/11/2021 09:41 AM 6.3 (H) <5.7 % of total Hgb Final    Comment:    For someone without known diabetes, a hemoglobin  A1c value between 5.7%  and 6.4% is consistent with prediabetes and should be confirmed with a  follow-up test. . For someone with known diabetes, a value <7% indicates that their diabetes is well controlled. A1c targets should be individualized based on duration of diabetes, age, comorbid conditions, and other considerations. . This assay result is consistent with an increased risk of diabetes. . Currently, no consensus exists regarding use of hemoglobin A1c for diagnosis of diabetes for children. .     CBG: Recent Labs  Lab 05/15/21 1617 05/15/21 1917 05/15/21 2330 05/16/21 0317 05/16/21 0721  GLUCAP 242* 198* 163* 140* 147*    No Known Allergies   Medications  Scheduled Meds:  amiodarone  200 mg Per Tube BID   Followed by   Derrill Memo ON 05/22/2021] amiodarone  200 mg Per Tube Daily   arformoterol  15 mcg Nebulization BID   vitamin C  500 mg Per Tube BID   budesonide (PULMICORT) nebulizer solution  0.25 mg Nebulization BID   chlorhexidine gluconate (MEDLINE KIT)  15 mL Mouth Rinse BID   Chlorhexidine Gluconate Cloth  6 each Topical Q0600   docusate  100 mg Per Tube BID   feeding supplement (PROSource TF)  90 mL Per Tube BID   free water  30 mL Per Tube Q4H   insulin aspart  1-3 Units Subcutaneous Q4H   insulin detemir  38 Units Subcutaneous BID   lactulose  20 g Per Tube BID   levETIRAcetam  1,000 mg Per Tube BID   linezolid  600 mg Per Tube Q12H   magic mouthwash  10 mL Oral QID   mouth rinse  15 mL Mouth Rinse 10 times per day   metoCLOPramide (REGLAN) injection  10 mg Intravenous Q8H   midodrine  10 mg Per Tube TID WC   multivitamin  1 tablet Per Tube QHS   nutrition supplement (JUVEN)  1 packet Per Tube BID BM   pantoprazole (PROTONIX) IV  40 mg Intravenous Q24H   polyethylene glycol  17 g Per Tube Daily   potassium & sodium phosphates  1 packet Per Tube TID WC & HS   revefenacin  175 mcg Nebulization Daily   sodium chloride flush  10-40 mL Intracatheter Q12H   zinc sulfate  220  mg Per Tube Daily   Continuous Infusions:   prismasol BGK 4/2.5      prismasol BGK 4/2.5     sodium chloride Stopped (05/13/21 2154)   amiodarone 30 mg/hr (05/16/21 0159)   cefTRIAXone (ROCEPHIN)  IV Stopped (05/15/21 1642)   feeding supplement (VITAL AF 1.2 CAL) 40 mL/hr at 05/15/21 0400   magnesium sulfate bolus IVPB     phenylephrine (NEO-SYNEPHRINE) Adult infusion 35 mcg/min (05/16/21 0659)   prismasol BGK 4/2.5     propofol (DIPRIVAN) infusion 5 mcg/kg/min (05/16/21 0659)   PRN Meds:.docusate sodium, fentaNYL (SUBLIMAZE) injection, heparin, lip balm, midazolam, ondansetron (ZOFRAN) IV, oxyCODONE, sennosides, sodium chloride, sodium chloride flush      DVT/GI PRX  assessed I Assessed the need for Labs I Assessed the need for Foley I  Assessed the need for Central Venous Line Family Discussion when available I Assessed the need for Mobilization I made an Assessment of medications to be adjusted accordingly Safety Risk assessment completed  CASE DISCUSSED IN MULTIDISCIPLINARY ROUNDS WITH ICU TEAM     Critical Care Time devoted to patient care services described in this note is 55 minutes.  Critical care was necessary to treat /prevent imminent and life-threatening deterioration. Overall, patient is critically ill, prognosis is guarded.  Patient with Multiorgan failure and at high risk for cardiac arrest and death.    Corrin Parker, M.D.  [facility]  Medical Director [place] Director [facility] Cardio-Pulmonary Department

## 2021-05-16 NOTE — Progress Notes (Signed)
Patient ID: Alexandra Foster, female   DOB: 1950-12-21, 71 y.o.   MRN: [id number]     [place] [facility] Day(s): 12.   Interval History: Patient seen and examined, no acute events or new complaints overnight.  Patient continue critically ill, sedated on mechanical ventilation.  No issues with the wound VAC system as per nurse.  Vital signs in last 24 hours: [min-max] current  Temp:  [95.4 F (35.2 C)-98.6 F (37 C)] 97.2 F (36.2 C) (02/02 0830) Pulse Rate:  [31-151] 79 (02/02 0815) Resp:  [16-36] 29 (02/02 0830) BP: (72-151)/(42-131) 98/57 (02/02 0830) SpO2:  [99 %-100 %] 100 % (02/02 0815) FiO2 (%):  [30 %-35 %] 30 % (02/02 0754) Weight:  [124 kg] 124 kg (02/02 0500)     Height: 5\' 5"  (165.1 cm) Weight: 124 kg BMI (Calculated): 45.49   Physical Exam:  Constitutional: Critically ill, sedated on mechanical ventilation Extremity: Left leg covered with negative pressure dressing.  Left foot warm without sign of ischemia.  Labs:  CBC Latest Ref Rng & Units 05/16/2021 05/15/2021 05/14/2021  WBC 4.0 - 10.5 K/uL 12.6(H) 24.5(H) -  Hemoglobin 12.0 - 15.0 g/dL 9.5(L) 8.7(L) 8.0(L)  Hematocrit 36.0 - 46.0 % 28.6(L) 26.5(L) 23.5(L)  Platelets 150 - 400 K/uL 144(L) 122(L) -   CMP Latest Ref Rng & Units 05/16/2021 05/15/2021 05/15/2021  Glucose 70 - 99 mg/dL 156(H) 241(H) 228(H)  BUN 8 - 23 mg/dL 45(H) 51(H) 48(H)  Creatinine 0.44 - 1.00 mg/dL 1.32(H) 1.43(H) 1.66(H)  Sodium 135 - 145 mmol/L 134(L) 132(L) 131(L)  Potassium 3.5 - 5.1 mmol/L 3.1(L) 3.3(L) 3.8  Chloride 98 - 111 mmol/L 101 99 98  CO2 22 - 32 mmol/L 22 21(L) 24  Calcium 8.9 - 10.3 mg/dL 9.6 9.3 9.2  Total Protein 6.5 - 8.1 g/dL - - 6.6  Total Bilirubin 0.3 - 1.2 mg/dL - - 5.0(H)  Alkaline Phos 38 - 126 U/L - - 328(H)  AST 15 - 41 U/L - - 639(H)  ALT 0 - 44 U/L - - 749(H)    Imaging studies: No new pertinent imaging studies   Assessment/Plan:  71 y.o. female with left leg necrotizing fasciitis with 9 Day Post-Op  s/p debridement, complicated by pertinent comorbidities including strep pyogenes bacteremia now in septic shock, respiratory failure mechanical ventilation, liver failure, acute renal failure on CRRT, COPD, type 2 diabetes mellitus, hypertension, obstructive sleep apnea, GERD   Left leg necrotizing fasciitis -S/p debridement 05/07/2021 -Second debridement on 05/10/2021.  Application of wound VAC system -I coordinated to change the wound VAC tomorrow and apply extracellular matrix to aid in wound healing.  -There has been slow improvement.  Continue without vasopressors.  -I will continue to follow closely.  Arnold Long, MD

## 2021-05-16 NOTE — Progress Notes (Signed)
Date of Admission:  05/04/2021     ID: Alexandra Foster is a 71 y.o. female Principal Problem:   Severe sepsis with septic shock (CODE) ([place]) Active Problems:   COPD (chronic obstructive pulmonary disease) ([place])   Morbid obesity ([place])   Diabetes mellitus type 2 in obese ([place])   Atrial fibrillation and flutter (HCC)   Necrotizing fasciitis ([place])   Acute on chronic respiratory failure ([place])   On mechanically assisted ventilation ([place])   Shock liver   Anemia associated with acute blood loss   Metabolic acidosis   Encounter for continuous renal replacement therapy (CRRT) for acute renal failure (HCC)   Acute encephalopathy   Group A streptococcal infection   Streptococcal toxic shock syndrome (HCC)    Subjective: Remains intubated. On pressor On Dilaudid  Medications:   arformoterol  15 mcg Nebulization BID   vitamin C  500 mg Per Tube BID   budesonide (PULMICORT) nebulizer solution  0.25 mg Nebulization BID   chlorhexidine gluconate (MEDLINE KIT)  15 mL Mouth Rinse BID   Chlorhexidine Gluconate Cloth  6 each Topical Q0600   docusate  100 mg Per Tube BID   feeding supplement (PROSource TF)  45 mL Per Tube TID   free water  30 mL Per Tube Q4H   insulin aspart  1-3 Units Subcutaneous Q4H   insulin detemir  38 Units Subcutaneous BID   levETIRAcetam  1,000 mg Per Tube BID   linezolid  600 mg Per Tube Q12H   magic mouthwash  10 mL Oral QID   mouth rinse  15 mL Mouth Rinse 10 times per day   midodrine  10 mg Per Tube TID WC   multivitamin  1 tablet Per Tube QHS   nutrition supplement (JUVEN)  1 packet Per Tube BID BM   pantoprazole (PROTONIX) IV  40 mg Intravenous Q24H   polyethylene glycol  17 g Per Tube Daily   revefenacin  175 mcg Nebulization Daily   sodium chloride flush  10-40 mL Intracatheter Q12H   zinc sulfate  220 mg Per Tube Daily    Objective: Vital signs in last 24 hours: Temp:  [95.2 F (35.1 C)-98.6 F (37 C)] 97.3 F (36.3 C) (02/02 2300) Pulse  Rate:  [70-82] 78 (02/02 2300) Resp:  [15-32] 20 (02/02 2300) BP: (88-135)/(37-90) 105/59 (02/02 2300) SpO2:  [99 %-100 %] 100 % (02/02 2300) FiO2 (%):  [30 %-35 %] 30 % (02/02 2000) Weight:  [643 kg] 124 kg (02/02 0500)  PHYSICAL EXAM:  General: Intubated and sedated. Head: Normocephalic, without obvious abnormality, atraumatic. Eyes: Conjunctivae clear, anicteric sclerae. Pupils are equal Oral cavity tongue has dried blood Neck: Bilateral IJ lines No carotid bruit and no JVD. Lungs: Bilateral air entry Heart: Regular rate and rhythm, no murmur, rub or gallop. Abdomen: Soft, non-tender,not distended. Bowel sounds normal. No masses Extremities: Right lower extremity wound VAC present  Picture reviewed    skin: No rashes or lesions. Or bruising Lymph: Cervical, supraclavicular normal. Neurologic: Cannot be assessed  Lab Results Recent Labs    05/15/21 0313 05/15/21 1708 05/16/21 0422 05/16/21 1646  WBC 24.5*  --  12.6*  --   HGB 8.7*  --  9.5*  --   HCT 26.5*  --  28.6*  --   NA 131*   < > 134* 133*  K 3.8   < > 3.1* 3.4*  CL 98   < > 101 100  CO2 24   < > 22  20*  BUN 48*   < > 45* 43*  CREATININE 1.66*   < > 1.32* 1.20*   < > = values in this interval not displayed.   Liver Panel Recent Labs    05/15/21 0313 05/15/21 1708 05/16/21 0422 05/16/21 1646  PROT 6.6  --   --   --   ALBUMIN 2.6*   2.6*   < > 2.2* 2.1*  AST 639*  --   --   --   ALT 749*  --   --   --   ALKPHOS 328*  --   --   --   BILITOT 5.0*  --   --   --   BILIDIR 3.5*  --   --   --   IBILI 1.5*  --   --   --    < > = values in this interval not displayed.   Sedimentation Rate No results for input(s): ESRSEDRATE in the last 72 hours.  C-Reactive Protein No results for input(s): CRP in the last 72 hours.  Microbiology:  05/04/21 BC Group A strep 4/4 1of 4 staph epidermidis- a contaminant 05/07/21- Wound culture NG 05/09/21 BC-NG Studies/Results: DG Chest 1 View  Result Date:  05/15/2021 CLINICAL DATA:  71 year old female with respiratory failure. Sepsis. EXAM: CHEST  1 VIEW COMPARISON:  Portable chest 05/11/2021 and earlier. FINDINGS: Portable AP supine view at 0609 hours. The patient remains mildly rotated to the right. Endotracheal tube tip in good position between the clavicles and carina. Stable left IJ approach dual lumen vascular catheter. Enteric tube and pH probe in place and appears stable. Right IJ central line tip now projects at the level the right atrium although lung volumes are slightly lower. Stable cardiac size and mediastinal contours. No pneumothorax. No pleural effusion or consolidation. Increased pulmonary vascularity since 05/10/2021. Paucity of bowel gas. No acute osseous abnormality identified. IMPRESSION: 1. Essentially stable lines and tubes. 2. Lower lung volumes with increased pulmonary vascularity. Consider mild or developing interstitial edema. No pleural effusion or consolidation. Electronically Signed   By: Genevie Ann M.D.   On: 05/15/2021 06:46   DG Abd 1 View  Result Date: 05/15/2021 CLINICAL DATA:  71 year old female with respiratory failure. Sepsis. EXAM: ABDOMEN - 1 VIEW COMPARISON:  05/11/2021. FINDINGS: Portable AP supine views at 0603 hours. Enteric tube terminates in the gastric body. Side hole appears to be inside the stomach. There is mild to moderate gastric distention with air. A small volume of fundal oral contrast persists. Paucity of bowel gas elsewhere in the visible abdomen and pelvis. Left hemipelvis phlebolith. No acute osseous abnormality identified. IMPRESSION: 1. Satisfactory enteric tube termination in the stomach. Mild to moderate gastric distention with air. Small volume oral contrast suspected in the gastric fundus. 2. Paucity of bowel gas elsewhere in the abdomen and pelvis. Electronically Signed   By: Genevie Ann M.D.   On: 05/15/2021 06:47   MR ANGIO HEAD WO CONTRAST  Result Date: 05/15/2021 CLINICAL DATA:  Subarachnoid  hemorrhage EXAM: MRA HEAD WITHOUT CONTRAST TECHNIQUE: Angiographic images of the Circle of Willis were acquired using MRA technique without intravenous contrast. COMPARISON:  No prior MRA, correlation is made with MRI 05/14/2021 FINDINGS: Anterior circulation: Both internal carotid arteries are patent to the termini, without significant stenosis. A1 segments patent. Normal anterior communicating artery. Anterior cerebral arteries are patent to their distal aspects. No M1 stenosis or occlusion. Normal MCA bifurcations. Distal MCA branches perfused and symmetric. Posterior circulation: Vertebral arteries patent to  the vertebrobasilar junction without stenosis. Basilar patent to its distal aspect. Superior cerebellar arteries patent bilaterally. Patent P1 segments. PCAs perfused to their distal aspects without stenosis. The bilateral posterior communicating arteries are patent. Anatomic variants: None significant Other: None. IMPRESSION: No intracranial large vessel occlusion or significant stenosis. No aneurysm or other vascular abnormality. Electronically Signed   By: Merilyn Baba M.D.   On: 05/15/2021 02:43   ECHOCARDIOGRAM COMPLETE  Result Date: 05/16/2021    ECHOCARDIOGRAM REPORT   Patient Name:   [facility] Date of Exam: 05/16/2021 Medical Rec #:  [id number]           Height:       65.0 in Accession #:    [id number]          Weight:       273.4 lb Date of Birth:  [birth year]           BSA:          2.259 m Patient Age:    54 years            BP:           115/50 mmHg Patient Gender: F                   HR:           75 bpm. Exam Location:  [facility] Procedure: 2D Echo, Color Doppler and Cardiac Doppler Indications:     R94.31 Abnormal ECG  History:         Patient has prior history of Echocardiogram examinations, most                  recent 05/06/2021. COPD; Risk Factors:Diabetes and Hypertension.  Sonographer:     Charmayne Sheer Referring Phys:  [id number] Flora Lipps Diagnosing Phys: Ida Rogue MD  Sonographer  Comments: Echo performed with patient supine and on artificial respirator and Technically difficult study due to poor echo windows. Image acquisition challenging due to patient body habitus and Image acquisition challenging due to COPD. IMPRESSIONS  1. Left ventricular ejection fraction, by estimation, is 60 to 65%. The left ventricle has normal function. The left ventricle has no regional wall motion abnormalities. There is mild left ventricular hypertrophy. Left ventricular diastolic parameters are consistent with Grade I diastolic dysfunction (impaired relaxation).  2. Right ventricular systolic function is normal. The right ventricular size is normal. There is mildly elevated pulmonary artery systolic pressure. The estimated right ventricular systolic pressure is 61.6 mmHg.  3. The mitral valve is normal in structure. No evidence of mitral valve regurgitation. No evidence of mitral stenosis.  4. The aortic valve is normal in structure. Aortic valve regurgitation is not visualized. No aortic stenosis is present.  5. The inferior vena cava is normal in size with greater than 50% respiratory variability, suggesting right atrial pressure of 3 mmHg. FINDINGS  Left Ventricle: Left ventricular ejection fraction, by estimation, is 60 to 65%. The left ventricle has normal function. The left ventricle has no regional wall motion abnormalities. The left ventricular internal cavity size was normal in size. There is  mild left ventricular hypertrophy. Left ventricular diastolic parameters are consistent with Grade I diastolic dysfunction (impaired relaxation). Right Ventricle: The right ventricular size is normal. No increase in right ventricular wall thickness. Right ventricular systolic function is normal. There is mildly elevated pulmonary artery systolic pressure. The tricuspid regurgitant velocity is 3.16  m/s, and with an assumed right atrial pressure of 5 mmHg,  the estimated right ventricular systolic pressure is 16.1  mmHg. Left Atrium: Left atrial size was normal in size. Right Atrium: Right atrial size was normal in size. Pericardium: There is no evidence of pericardial effusion. Mitral Valve: The mitral valve is normal in structure. No evidence of mitral valve regurgitation. No evidence of mitral valve stenosis. MV peak gradient, 3.7 mmHg. The mean mitral valve gradient is 2.0 mmHg. Tricuspid Valve: The tricuspid valve is normal in structure. Tricuspid valve regurgitation is mild . No evidence of tricuspid stenosis. Aortic Valve: The aortic valve is normal in structure. Aortic valve regurgitation is not visualized. No aortic stenosis is present. Aortic valve mean gradient measures 6.0 mmHg. Aortic valve peak gradient measures 9.9 mmHg. Aortic valve area, by VTI measures 1.95 cm. Pulmonic Valve: The pulmonic valve was normal in structure. Pulmonic valve regurgitation is mild. No evidence of pulmonic stenosis. Aorta: The aortic root is normal in size and structure. Venous: The inferior vena cava is normal in size with greater than 50% respiratory variability, suggesting right atrial pressure of 3 mmHg. IAS/Shunts: No atrial level shunt detected by color flow Doppler.  LEFT VENTRICLE PLAX 2D LVIDd:         4.57 cm   Diastology LVIDs:         3.74 cm   LV e' medial:    5.66 cm/s LV PW:         1.01 cm   LV E/e' medial:  10.6 LV IVS:        0.73 cm   LV e' lateral:   4.13 cm/s LVOT diam:     1.80 cm   LV E/e' lateral: 14.6 LV SV:         47 LV SV Index:   21 LVOT Area:     2.54 cm  LEFT ATRIUM           Index LA diam:      3.20 cm 1.42 cm/m LA Vol (A4C): 42.8 ml 18.95 ml/m  AORTIC VALVE                     PULMONIC VALVE AV Area (Vmax):    2.07 cm      PV Vmax:          1.32 m/s AV Area (Vmean):   1.83 cm      PV Vmean:         97.400 cm/s AV Area (VTI):     1.95 cm      PV VTI:           0.208 m AV Vmax:           157.00 cm/s   PV Peak grad:     7.0 mmHg AV Vmean:          108.000 cm/s  PV Mean grad:     4.0 mmHg AV VTI:             0.240 m       PR End Diast Vel: 6.66 msec AV Peak Grad:      9.9 mmHg AV Mean Grad:      6.0 mmHg LVOT Vmax:         128.00 cm/s LVOT Vmean:        77.800 cm/s LVOT VTI:          0.184 m LVOT/AV VTI ratio: 0.77  AORTA Ao Root diam: 2.60 cm MITRAL VALVE  TRICUSPID VALVE MV Area (PHT): 3.27 cm    TR Peak grad:   39.9 mmHg MV Area VTI:   1.81 cm    TR Vmax:        316.00 cm/s MV Peak grad:  3.7 mmHg MV Mean grad:  2.0 mmHg    SHUNTS MV Vmax:       0.96 m/s    Systemic VTI:  0.18 m MV Vmean:      56.7 cm/s   Systemic Diam: 1.80 cm MV Decel Time: 232 msec MV E velocity: 60.10 cm/s MV A velocity: 67.90 cm/s MV E/A ratio:  0.89 Ida Rogue MD Electronically signed by Ida Rogue MD Signature Date/Time: 05/16/2021/3:38:58 PM    Final      Assessment/Plan:  Streptococcus pyogenes bacteremia with septic shock and multiorgan failure.  Left leg necrotizing fasciitis. Status post debridement and due to on 05/07/2021 and 05/10/2021.  Now has a wound VAC.  She is going to need full-thickness graft Will need plastic surgery to see her Patient is currently on ceftriaxone and linezolid.  She was on penicillin but because of fluid overload it was changed to ceftriaxone on 05/14/2021. She also received 3 doses of IVIG  Acute hypoxic respiratory failure.  Remains intubated Planning for tracheostomy tomorrow  Leukocytosis almost back to normal. Acute respiratory failure patient remains intubated  Encephalopathy MRI showed small subarachnoid hemorrhage.  MRA was normal  AKI a combination of septic shock and hypovolemic shock.  On CRRT since 05/11/2021. No urine output   Transaminitis and hyperbilirubinemia secondary to shock has improved  Anemia has received PRBC in the past.  Hemoglobin dropping again.  The blood loss is from the left leg  Severe hypoalbuminemia with third spacing.  Has been getting albumin until yesterday  Diabetes mellitus On sliding scale  A. fib now in sinus  rhythm  on amiodarone  Discussed the management with the nurse and the care team.

## 2021-05-16 NOTE — Progress Notes (Addendum)
Nutrition Follow-up  DOCUMENTATION CODES:   Morbid obesity  INTERVENTION:   -Continue renal MVI daily via tube -Continue 500 mg vitamin C BID via tube -Continue 220 mg zinc daily via tube -Increase Vital AF 1.2 @ 65 ml/hr via OGT   45 ml Prosource TF TID.     30 ml free water flush every 6 hours to maintain tube patency   Tube feeding regimen provides 1992 kcal (100% of needs), 150 grams of protein, and 1265 ml of H2O. Total free water: 1445 ml daily   -1 packet Juven BID, each packet provides 95 calories, 2.5 grams of protein (collagen), and 9.8 grams of carbohydrate (3 grams sugar); also contains 7 grams of L-arginine and L-glutamine, 300 mg vitamin C, 15 mg vitamin E, 1.2 mcg vitamin B-12, 9.5 mg zinc, 200 mg calcium, and 1.5 g  Calcium Beta-hydroxy-Beta-methylbutyrate to support wound healing   NUTRITION DIAGNOSIS:   Increased nutrient needs related to wound healing as evidenced by estimated needs.  Ongoing  GOAL:   Patient will meet greater than or equal to 90% of their needs  Progressing   MONITOR:   PO intake, Supplement acceptance, Labs, Weight trends, Skin, I & O's  REASON FOR ASSESSMENT:   Rounds    ASSESSMENT:   71 y.o. female with a history of asthma/COPD, HTN, DMII, obesity, OSA on CPAP, GERD and COVID-19 pneumonia who was admitted 1/22 w/ left LE cellulitis, septic shock, S pyogenes bacteremia, hypotension, new Afib and AKI.  1/24- s/p I&D of lt leg 1/27- s/p PROCEDURE(S):  1.) Sharp excisional debridement of left leg 2.) Veraflow (negative pressure dressing (DME) placement); NGT placed for enteral nutrition; intubated 1/28- CRRT initiated 1/30- IVIG started due to streptococcal toxic shock syndrome 2/1- rectal tube placed  Patient is currently intubated on ventilator support MV: 13.4 L/min Temp (24hrs), Avg:97.3 F (36.3 C), Min:95.4 F (35.2 C), Max:98.6 F (37 C)  Reviewed I/O's: -2.1 L x 24 hours and +10.4 L since admission  UOP: 15 ml  x 24 hours  Drain output: 125 ml x 24 hours  Rectal tube output: 100 ml x 24 hours  Case discussed with RN. MD, and during ICU rounds.   Pt now with rectal tube due to loose stools. Plan to d/c reglan and transition colace and miralax to PRN.   Plan for trach tomorrow. General surgery also plans to add extracellular matrix to wound vac change tomorrow to decrease frequency of changes (pt does not tolerate wound vac changes well). Plan to hold off on PEG placement, but will place NGT in OR. Pt currently on TF and tolerating well.   Pt with elevated troponins, which are trending down. Plan for echo.   Medications reviewed and include colace, keppra, zyvox, miralax, dilaudid, and phenylephrine.   Labs reviewed: Na: 134, K: 3.1, CBGS: 140-198 (inpatient orders for glycemic control are 1-3 units insulin aspart every 4 hours and 38 units insulin detemir BID).    Diet Order:   Diet Order             Diet NPO time specified  Diet effective midnight                   EDUCATION NEEDS:   Education needs have been addressed  Skin:  Skin Assessment: Skin Integrity Issues: Skin Integrity Issues:: Stage I Stage I: coccyx Wound Vac: closed lt leg  Last BM:  05/16/21 (via rectal tube)  Height:   Ht Readings from Last 1 Encounters:  05/04/21 5\' 5"  (1.651 m)    Weight:   Wt Readings from Last 1 Encounters:  05/16/21 124 kg    Ideal Body Weight:  56.8 kg  BMI:  Body mass index is 45.49 kg/m.  Estimated Nutritional Needs:   Kcal:  2000-2200  Protein:  120-140 grams  Fluid:  > 2 L    Loistine Chance, RD, LDN, [place] Registered Dietitian II Certified Diabetes Care and Education Specialist Please refer to [facility] for RD and/or RD on-call/weekend/after hours pager

## 2021-05-16 NOTE — Progress Notes (Signed)
° °   Attending Progress Note  History: Alexandra Foster is here for sepsis and necrotizing fasciitis with continued critical illness.   She continues to slowly improve from a critical illness standpoint.   Physical Exam: Vitals:   05/16/21 0754 05/16/21 0800  BP:  (!) 109/53  Pulse:    Resp: (!) 26 (!) 28  Temp: (!) 97.3 F (36.3 C) (!) 97.5 F (36.4 C)  SpO2: 100%    Intubated, unresponsive Cough and gag intact No volitional movement  Data:  Recent Labs  Lab 05/15/21 0313 05/15/21 1708 05/16/21 0422  NA 131* 132* 134*  K 3.8 3.3* 3.1*  CL 98 99 101  CO2 24 21* 22  BUN 48* 51* 45*  CREATININE 1.66* 1.43* 1.32*  GLUCOSE 228* 241* 156*  CALCIUM 9.2 9.3 9.6   Recent Labs  Lab 05/15/21 0313  AST 639*  ALT 749*  ALKPHOS 328*     Recent Labs  Lab 05/14/21 0410 05/14/21 1056 05/15/21 0313 05/16/21 0422  WBC 28.4*  --  24.5* 12.6*  HGB 6.7*   < > 8.7* 9.5*  HCT 20.5*   < > 26.5* 28.6*  PLT 121*  --  122* 144*   < > = values in this interval not displayed.   Recent Labs  Lab 05/14/21 1056 05/16/21 0422  APTT 45* 32  INR 1.8* 1.3*         Other tests/results:  MRA 05/15/21 IMPRESSION: No intracranial large vessel occlusion or significant stenosis. No aneurysm or other vascular abnormality.     Electronically Signed   By: Merilyn Baba M.D.   On: 05/15/2021 02:43  Assessment/Plan:  Alexandra Foster is suffering from sepsis, necrotizing fasciitis, liver, kidney, and respiratory failure.  She was found to have a small subarachnoid hemorrhage on imaging.  This does not follow a vascular pattern, and there is no aneurysm on MRA.  I think her SAH is likely secondary to some event during her critical illness, whether swings in blood pressure, alterations of vascular permeability during sepsis, or some other cause.  - No further workup necessary - No change in management necessary - Care per ICU/ID/other teams involved  Meade Maw MD,  [facility], [place] Department of Neurosurgery

## 2021-05-17 ENCOUNTER — Inpatient Hospital Stay: Payer: Medicare HMO | Admitting: Certified Registered"

## 2021-05-17 ENCOUNTER — Encounter
Admission: EM | Disposition: A | Discharge status: Nursing Home (Includes ICF) | Payer: Self-pay | Source: Home / Self Care | Attending: Internal Medicine

## 2021-05-17 ENCOUNTER — Other Ambulatory Visit: Payer: Self-pay

## 2021-05-17 ENCOUNTER — Inpatient Hospital Stay: Payer: Medicare HMO

## 2021-05-17 DIAGNOSIS — J9601 Acute respiratory failure with hypoxia: Secondary | ICD-10-CM | POA: Diagnosis not present

## 2021-05-17 DIAGNOSIS — J441 Chronic obstructive pulmonary disease with (acute) exacerbation: Secondary | ICD-10-CM | POA: Diagnosis not present

## 2021-05-17 DIAGNOSIS — E1169 Type 2 diabetes mellitus with other specified complication: Secondary | ICD-10-CM

## 2021-05-17 DIAGNOSIS — I4891 Unspecified atrial fibrillation: Secondary | ICD-10-CM | POA: Diagnosis not present

## 2021-05-17 DIAGNOSIS — E669 Obesity, unspecified: Secondary | ICD-10-CM

## 2021-05-17 DIAGNOSIS — A491 Streptococcal infection, unspecified site: Secondary | ICD-10-CM | POA: Diagnosis not present

## 2021-05-17 DIAGNOSIS — G9340 Encephalopathy, unspecified: Secondary | ICD-10-CM | POA: Diagnosis not present

## 2021-05-17 DIAGNOSIS — A419 Sepsis, unspecified organism: Secondary | ICD-10-CM | POA: Diagnosis not present

## 2021-05-17 HISTORY — PX: TRACHEOSTOMY TUBE PLACEMENT: SHX814

## 2021-05-17 HISTORY — PX: APPLICATION OF WOUND VAC: SHX5189

## 2021-05-17 LAB — RENAL FUNCTION PANEL
Albumin: 1.9 g/dL — ABNORMAL LOW (ref 3.5–5.0)
Albumin: 2.2 g/dL — ABNORMAL LOW (ref 3.5–5.0)
Anion gap: 12 (ref 5–15)
Anion gap: 10 (ref 5–15)
BUN: 44 mg/dL — ABNORMAL HIGH (ref 8–23)
BUN: 52 mg/dL — ABNORMAL HIGH (ref 8–23)
CO2: 21 mmol/L — ABNORMAL LOW (ref 22–32)
CO2: 20 mmol/L — ABNORMAL LOW (ref 22–32)
Calcium: 8.3 mg/dL — ABNORMAL LOW (ref 8.9–10.3)
Calcium: 7.8 mg/dL — ABNORMAL LOW (ref 8.9–10.3)
Chloride: 102 mmol/L (ref 98–111)
Chloride: 103 mmol/L (ref 98–111)
Creatinine, Ser: 1.38 mg/dL — ABNORMAL HIGH (ref 0.44–1.00)
Creatinine, Ser: 1.58 mg/dL — ABNORMAL HIGH (ref 0.44–1.00)
GFR, Estimated: 41 mL/min — ABNORMAL LOW (ref >60)
GFR, Estimated: 35 mL/min — ABNORMAL LOW (ref >60)
Glucose, Bld: 103 mg/dL — ABNORMAL HIGH (ref 70–99)
Glucose, Bld: 159 mg/dL — ABNORMAL HIGH (ref 70–99)
Phosphorus: 3.6 mg/dL (ref 2.5–4.6)
Phosphorus: 4.6 mg/dL (ref 2.5–4.6)
Potassium: 4.5 mmol/L (ref 3.5–5.1)
Potassium: 5.1 mmol/L (ref 3.5–5.1)
Sodium: 135 mmol/L (ref 135–145)
Sodium: 133 mmol/L — ABNORMAL LOW (ref 135–145)

## 2021-05-17 LAB — CBC
HCT: 28 % — ABNORMAL LOW (ref 36.0–46.0)
Hemoglobin: 9.2 g/dL — ABNORMAL LOW (ref 12.0–15.0)
MCH: 28.8 pg (ref 26.0–34.0)
MCHC: 32.9 g/dL (ref 30.0–36.0)
MCV: 87.5 fL (ref 80.0–100.0)
Platelets: 156 10*3/uL (ref 150–400)
RBC: 3.2 MIL/uL — ABNORMAL LOW (ref 3.87–5.11)
RDW: 20.3 % — ABNORMAL HIGH (ref 11.5–15.5)
WBC: 11.4 10*3/uL — ABNORMAL HIGH (ref 4.0–10.5)
nRBC: 72.9 % — ABNORMAL HIGH (ref 0.0–0.2)

## 2021-05-17 LAB — HEPATIC FUNCTION PANEL
ALT: 326 U/L — ABNORMAL HIGH (ref 0–44)
AST: 163 U/L — ABNORMAL HIGH (ref 15–41)
Albumin: 1.9 g/dL — ABNORMAL LOW (ref 3.5–5.0)
Alkaline Phosphatase: 452 U/L — ABNORMAL HIGH (ref 38–126)
Bilirubin, Direct: 2.4 mg/dL — ABNORMAL HIGH (ref 0.0–0.2)
Indirect Bilirubin: 1.2 mg/dL — ABNORMAL HIGH (ref 0.3–0.9)
Total Bilirubin: 3.6 mg/dL — ABNORMAL HIGH (ref 0.3–1.2)
Total Protein: 6.2 g/dL — ABNORMAL LOW (ref 6.5–8.1)

## 2021-05-17 LAB — GLUCOSE, CAPILLARY
Glucose-Capillary: 107 mg/dL — ABNORMAL HIGH (ref 70–99)
Glucose-Capillary: 106 mg/dL — ABNORMAL HIGH (ref 70–99)
Glucose-Capillary: 109 mg/dL — ABNORMAL HIGH (ref 70–99)
Glucose-Capillary: 142 mg/dL — ABNORMAL HIGH (ref 70–99)
Glucose-Capillary: 138 mg/dL — ABNORMAL HIGH (ref 70–99)
Glucose-Capillary: 156 mg/dL — ABNORMAL HIGH (ref 70–99)

## 2021-05-17 LAB — MAGNESIUM: Magnesium: 2.4 mg/dL (ref 1.7–2.4)

## 2021-05-17 LAB — PROTIME-INR
INR: 1.3 — ABNORMAL HIGH (ref 0.8–1.2)
Prothrombin Time: 15.8 seconds — ABNORMAL HIGH (ref 11.4–15.2)

## 2021-05-17 SURGERY — CREATION, TRACHEOSTOMY
Anesthesia: General | Site: Neck

## 2021-05-17 SURGERY — APPLICATION, WOUND VAC
Anesthesia: Choice | Laterality: Left

## 2021-05-17 MED ORDER — HYDROCORTISONE SOD SUC (PF) 100 MG IJ SOLR
100.0000 mg | Freq: Three times a day (TID) | INTRAMUSCULAR | Status: DC
  Administered 2021-05-17 – 2021-05-19 (×6): 100 mg via INTRAVENOUS
  Filled 2021-05-17 (×6): qty 2

## 2021-05-17 MED ORDER — SODIUM CHLORIDE 0.9 % IV SOLN: INTRAVENOUS | Status: DC | PRN

## 2021-05-17 MED ORDER — MAGIC MOUTHWASH W/LIDOCAINE
10.0000 mL | Freq: Four times a day (QID) | ORAL | Status: DC
  Administered 2021-05-17 – 2021-05-20 (×11): 10 mL via ORAL
  Filled 2021-05-17 (×14): qty 10

## 2021-05-17 MED ORDER — PHENYLEPHRINE 40 MCG/ML (10ML) SYRINGE FOR IV PUSH (FOR BLOOD PRESSURE SUPPORT)
PREFILLED_SYRINGE | INTRAVENOUS | Status: DC | PRN
  Administered 2021-05-17: 200 ug via INTRAVENOUS
  Administered 2021-05-17: 100 ug via INTRAVENOUS

## 2021-05-17 MED ORDER — PHENYLEPHRINE HCL (PRESSORS) 10 MG/ML IV SOLN
INTRAVENOUS | Status: AC
  Filled 2021-05-17: qty 1

## 2021-05-17 MED ORDER — LIDOCAINE-EPINEPHRINE 1 %-1:100000 IJ SOLN
INTRAMUSCULAR | Status: AC
  Filled 2021-05-17: qty 1

## 2021-05-17 MED ORDER — LIDOCAINE-EPINEPHRINE 1 %-1:100000 IJ SOLN
INTRAMUSCULAR | Status: DC | PRN
  Administered 2021-05-17: 5 mL

## 2021-05-17 MED ORDER — ROCURONIUM BROMIDE 100 MG/10ML IV SOLN
INTRAVENOUS | Status: DC | PRN
  Administered 2021-05-17: 70 mg via INTRAVENOUS

## 2021-05-17 MED ORDER — FENTANYL CITRATE (PF) 100 MCG/2ML IJ SOLN
INTRAMUSCULAR | Status: AC
  Filled 2021-05-17: qty 2

## 2021-05-17 MED ORDER — MIDAZOLAM HCL 2 MG/2ML IJ SOLN
INTRAMUSCULAR | Status: DC | PRN
  Administered 2021-05-17: 2 mg via INTRAVENOUS

## 2021-05-17 MED ORDER — MIDAZOLAM HCL 2 MG/2ML IJ SOLN
INTRAMUSCULAR | Status: AC
  Filled 2021-05-17: qty 2

## 2021-05-17 MED ORDER — ALBUMIN HUMAN 25 % IV SOLN
25.0000 g | Freq: Two times a day (BID) | INTRAVENOUS | Status: DC
  Administered 2021-05-17 – 2021-05-19 (×6): 25 g via INTRAVENOUS
  Filled 2021-05-17 (×7): qty 100

## 2021-05-17 MED ORDER — SODIUM ZIRCONIUM CYCLOSILICATE 5 G PO PACK
10.0000 g | PACK | Freq: Once | ORAL | Status: AC
  Administered 2021-05-17: 10 g
  Filled 2021-05-17: qty 2

## 2021-05-17 MED ORDER — PHENYLEPHRINE CONCENTRATED 100MG/250ML (0.4 MG/ML) INFUSION SIMPLE
0.0000 ug/min | INTRAVENOUS | Status: DC
  Administered 2021-05-17: 220 ug/min via INTRAVENOUS
  Filled 2021-05-17 (×3): qty 250

## 2021-05-17 MED ORDER — 0.9 % SODIUM CHLORIDE (POUR BTL) OPTIME
TOPICAL | Status: DC | PRN
  Administered 2021-05-17: 1000 mL

## 2021-05-17 MED ORDER — VASOPRESSIN 20 UNITS/100 ML INFUSION FOR SHOCK
0.0000 [IU]/min | INTRAVENOUS | Status: DC
  Administered 2021-05-17 – 2021-05-20 (×6): 0.03 [IU]/min via INTRAVENOUS
  Administered 2021-05-20: 0.02 [IU]/min via INTRAVENOUS
  Filled 2021-05-17 (×9): qty 100

## 2021-05-17 MED ORDER — SUGAMMADEX SODIUM 200 MG/2ML IV SOLN
INTRAVENOUS | Status: DC | PRN
  Administered 2021-05-17: 200 mg via INTRAVENOUS

## 2021-05-17 MED ORDER — SODIUM CHLORIDE 0.9 % IV SOLN
0.0000 ug/min | INTRAVENOUS | Status: DC
  Administered 2021-05-17: 180 ug/min via INTRAVENOUS
  Filled 2021-05-17: qty 5

## 2021-05-17 MED ORDER — VASOPRESSIN 20 UNIT/ML IV SOLN
INTRAVENOUS | Status: DC | PRN
Start: 2021-05-17 — End: 2021-05-17
  Administered 2021-05-17: 1 [IU] via INTRAVENOUS
  Administered 2021-05-17: 2 [IU] via INTRAVENOUS
  Administered 2021-05-17 (×2): 1 [IU] via INTRAVENOUS

## 2021-05-17 MED ORDER — MAGIC MOUTHWASH
10.0000 mL | Freq: Four times a day (QID) | ORAL | Status: DC
  Filled 2021-05-17 (×3): qty 10

## 2021-05-17 MED ORDER — PROPOFOL 500 MG/50ML IV EMUL
INTRAVENOUS | Status: AC
  Filled 2021-05-17: qty 50

## 2021-05-17 SURGICAL SUPPLY — 67 items
4.0 vicryl PS-2 ×1 IMPLANT
BASIN GRAD PLASTIC 32OZ STRL (MISCELLANEOUS) ×1 IMPLANT
BLADE SURG 15 STRL LF DISP TIS (BLADE) ×3 IMPLANT
BLADE SURG 15 STRL SS (BLADE) ×4
BLADE SURG SZ11 CARB STEEL (BLADE) ×4 IMPLANT
BNDG COHESIVE 4X5 TAN ST LF (GAUZE/BANDAGES/DRESSINGS) ×1 IMPLANT
CANISTER WOUND CARE 500ML ATS (WOUND CARE) ×4 IMPLANT
DRAPE INCISE 23X17 IOBAN STRL (DRAPES) ×2
DRAPE INCISE 23X17 STRL (DRAPES) IMPLANT
DRAPE INCISE IOBAN 23X17 STRL (DRAPES) ×6 IMPLANT
DRAPE LAPAROTOMY 100X77 ABD (DRAPES) ×4 IMPLANT
DRAPE MAG INST 16X20 L/F (DRAPES) ×4 IMPLANT
DRSG EMULSION OIL 3X8 NADH (GAUZE/BANDAGES/DRESSINGS) ×4 IMPLANT
DRSG MEPITEL 4X7.2 (GAUZE/BANDAGES/DRESSINGS) ×2 IMPLANT
DRSG MEPITEL 8X12 (GAUZE/BANDAGES/DRESSINGS) ×3 IMPLANT
DRSG VAC ATS LRG SENSATRAC (GAUZE/BANDAGES/DRESSINGS) ×3 IMPLANT
DRSG VAC ATS MED SENSATRAC (GAUZE/BANDAGES/DRESSINGS) ×4 IMPLANT
ELECT CAUTERY BLADE TIP 2.5 (TIP) ×4
ELECT REM PT RETURN 9FT ADLT (ELECTROSURGICAL) ×4
ELECTRODE CAUTERY BLDE TIP 2.5 (TIP) ×3 IMPLANT
ELECTRODE REM PT RTRN 9FT ADLT (ELECTROSURGICAL) ×6 IMPLANT
GAUZE 4X4 16PLY ~~LOC~~+RFID DBL (SPONGE) ×9 IMPLANT
GLOVE SURG ENC MOIS LTX SZ6.5 (GLOVE) ×4 IMPLANT
GLOVE SURG ENC MOIS LTX SZ7.5 (GLOVE) ×4 IMPLANT
GLOVE SURG SYN 6.5 ES PF (GLOVE) ×4 IMPLANT
GLOVE SURG SYN 6.5 PF PI (GLOVE) IMPLANT
GLOVE SURG UNDER POLY LF SZ6.5 (GLOVE) ×4 IMPLANT
GLOVE SURG UNDER POLY LF SZ7 (GLOVE) ×1 IMPLANT
GOWN STRL REUS W/ TWL LRG LVL3 (GOWN DISPOSABLE) ×12 IMPLANT
GOWN STRL REUS W/TWL LRG LVL3 (GOWN DISPOSABLE) ×16
GRAFT MYRIAD 20X20 (Graft) ×1 IMPLANT
GRAFT MYRIAD 3 LAYER 10X10 (Graft) ×1 IMPLANT
GRAFT MYRIAD 3 LAYER 10X20 (Graft) ×2 IMPLANT
HEMOSTAT SURGICEL 2X3 (HEMOSTASIS) IMPLANT
HLDR TRACH TUBE NECKBAND 18 (MISCELLANEOUS) ×3 IMPLANT
HOLDER TRACH TUBE NECKBAND 18 (MISCELLANEOUS) ×4
KIT TURNOVER KIT A (KITS) ×8 IMPLANT
LABEL OR SOLS (LABEL) ×4 IMPLANT
MANIFOLD NEPTUNE II (INSTRUMENTS) ×8 IMPLANT
NS IRRIG 500ML POUR BTL (IV SOLUTION) ×4 IMPLANT
PACK BASIN MINOR ARMC (MISCELLANEOUS) ×4 IMPLANT
PACK HEAD/NECK (MISCELLANEOUS) ×4 IMPLANT
POWDER MYRIAD MORCELLS 1000MG (Miscellaneous) ×1 IMPLANT
POWDER MYRIAD MORCELLS 500MG (Miscellaneous) ×1 IMPLANT
SHEARS HARMONIC 9CM CVD (BLADE) ×4 IMPLANT
SOL PREP PVP 2OZ (MISCELLANEOUS) ×4
SOLUTION PREP PVP 2OZ (MISCELLANEOUS) ×3 IMPLANT
SPONGE DRAIN TRACH 4X4 STRL 2S (GAUZE/BANDAGES/DRESSINGS) ×4 IMPLANT
SPONGE KITTNER 5P (MISCELLANEOUS) ×4 IMPLANT
SPONGE T-LAP 18X18 ~~LOC~~+RFID (SPONGE) ×4 IMPLANT
STOCKINETTE IMPERVIOUS 9X36 MD (GAUZE/BANDAGES/DRESSINGS) ×1 IMPLANT
SUCTION FRAZIER HANDLE 10FR (MISCELLANEOUS) ×4
SUCTION TUBE FRAZIER 10FR DISP (MISCELLANEOUS) IMPLANT
SURGILUBE 2OZ TUBE FLIPTOP (MISCELLANEOUS) ×1 IMPLANT
SUT ETHILON 2 0 FS 18 (SUTURE) ×4 IMPLANT
SUT SILK 2 0 (SUTURE) ×8
SUT SILK 2-0 18XBRD TIE 12 (SUTURE) ×3 IMPLANT
SUT VIC AB 3-0 PS2 18 (SUTURE) ×5 IMPLANT
SUT VIC AB 4-0 PS2 18 (SUTURE) ×1 IMPLANT
SYR 10ML LL (SYRINGE) ×4 IMPLANT
TUBE TRACH  6.0 CUFF FLEX (MISCELLANEOUS)
TUBE TRACH 6.0 CUFF FLEX (MISCELLANEOUS) IMPLANT
TUBE TRACH 8.0 EXL PROX  CUF (TUBING)
TUBE TRACH 8.0 EXL PROX CUF (TUBING) IMPLANT
TUBE TRACH FLEX 8.0 CUFF (MISCELLANEOUS) ×4
TUBE TRACH FLEX 8.5 CUFF (MISCELLANEOUS) IMPLANT
WATER STERILE IRR 500ML POUR (IV SOLUTION) ×5 IMPLANT

## 2021-05-17 NOTE — Progress Notes (Signed)
Restarted CRRT with no complications.

## 2021-05-17 NOTE — H&P (Signed)
Patient has had ongoing blood pressure lability and they added another pressor this morning.  Discussed with Dr. Mortimer Fries as well as Dr. Micah Flesher and they feel that despite this it is reasonable to proceed with surgery.  I did discuss with the family that the patient is obviously critically ill so any procedures carry some risk from an anesthetic perspective, but they do agree to proceed.. All questions regarding the procedure answered, and family expressed understanding of the procedure.  [name] @TODAY @

## 2021-05-17 NOTE — Progress Notes (Signed)
NAME:  Alexandra Foster, MRN:  [id number], DOB:  1950/04/16, LOS: 7 ADMISSION DATE:  05/04/2021  BRIEF SYNOPSIS 71 y.o female with medical history significant of Asthma/COPD, T2DM, HTN, OSA on CPAP, GERD, COVID-19 pneumonia, and Bilateral lower extremity edema who presented to the ED with chief complaint of LLE pain and chills since Thursday.  Patient was given 30 cc/kg of fluids and started on broad-spectrum antibiotics for sepsis with septic shock.   Admitted to the ICU with severe sepsis with shock secondary to cellulitis of LLE septic shock with LLL necrotizing fascitis post op resp failure and severe septic shock with progressive renal failure on CRRT and progressive multiorgan failure  MRI/CT SHOWS SMALL SAH  Past Medical History     Arthritis   Asthma   COPD (chronic obstructive pulmonary disease) ([place])   Diabetes mellitus without complication ([place])   Endometriosis   GERD (gastroesophageal reflux disease)   Hypertension   Obesity   Sleep apnea    CPAP    Significant Hospital Events   05/04/21: Admitted to the ICU with severe sepsis with shock secondary to cellulitis of LLE 1/23 off pressors +Afib with RVR 1/24 blood pressure low  1/24 to OR for fasciotomy 1/25 post op resp failure with severe septic shock 1/26 remains on pressors 1/27 returns to the OR today for debridement, wound VAC 1/28 overnight with hemodynamic deterioration, required reintubation, currently on pressors 1/29 remains on 3 pressors, CRRT, wound VAC and minimal sedation.  Awakens to voice and touch.  Seen by vascular surgery and awaiting input from general surgery regarding amputation of left leg 1/30 remains on vent remains on pressors 1/31 remains on pressors, on vent, on CRRT 2/1 remains on pressors, on CRRT, MRI/CT shows SAH, afib with RVR, ECHO pending, restarted AMIO at 33 2/2 remains on vent plan for trach tomorrow 2/3 remains on crrt, remains on vent, plan for [facility] and wound vac change in  RO   Significant Diagnostic Tests:  1/21: Chest Xray> no active cardiopulmonary process 1/21: CT left lower leg>Diffuse subcutaneous edema may represent cellulitis. No drainable fluid collection/abscess 1/21: Ultrasound lower unilateral left> no DVT   Micro Data:  1/21: SARS-CoV-2 PCR> negative 1/21: Influenza PCR> negative 1/21: Blood culture x2> Streptococcus pyogenes 1/21: Urine Culture> negative 1/21: MRSA PCR>> negative   Antimicrobials:  Vancomycin 1/21> off Cefepime 1/21> off Ceftriaxone 1/21 >> off Penicilli G 1/24>> Linezolid 1/25>>  *Antimicrobials per ID    Interim History / Subjective:  Remains critically ill Patient with acute necrotizing fasciitis with emergent fasciotomy Off pressors On CRRT Remains on vent Plan for trach today   Vent Mode: PRVC FiO2 (%):  [30 %-40 %] 40 % Set Rate:  [20 bmp] 20 bmp Vt Set:  [460 mL] 460 mL PEEP:  [5 cmH20] 5 cmH20     Objective   Blood pressure (!) 104/48, pulse 82, temperature 98.6 F (37 C), resp. rate 15, height '5\' 5"'  (1.651 m), weight 124 kg, SpO2 99 %.    Vent Mode: PRVC FiO2 (%):  [30 %-40 %] 40 % Set Rate:  [20 bmp] 20 bmp Vt Set:  [460 mL] 460 mL PEEP:  [5 cmH20] 5 cmH20   Intake/Output Summary (Last 24 hours) at 05/17/2021 0733 Last data filed at 05/17/2021 0700 Gross per 24 hour  Intake 1814.24 ml  Output 4024 ml  Net -2209.76 ml    Filed Weights   05/15/21 0452 05/16/21 0500 05/17/21 0445  Weight: 128.4 kg 124 kg 124 kg  REVIEW OF SYSTEMS  PATIENT IS UNABLE TO PROVIDE COMPLETE REVIEW OF SYSTEMS DUE TO SEVERE CRITICAL ILLNESS AND TOXIC METABOLIC ENCEPHALOPATHY    PHYSICAL EXAMINATION:  GENERAL:critically ill appearing, +resp distress EYES: Pupils equal, round, reactive to light.  No scleral icterus.  MOUTH: Moist mucosal membrane. INTUBATED NECK: Supple.  PULMONARY: +rhonchi, +wheezing CARDIOVASCULAR: S1 and S2.  No murmurs  GASTROINTESTINAL: Soft, nontender, -distended.  Positive bowel sounds.  MUSCULOSKELETAL: No swelling, clubbing, or edema.  NEUROLOGIC: obtunded MUSCULOSKELETAL: Left lower extremity with wound VAC in place, surgery redressing.  Status post fasciotomy   Assessment & Plan   Admitted to the ICU with severe sepsis with shock secondary to cellulitis of LLE septic shock with LLL necrotizing fascitis post op resp failure and severe septic shock with progressive renal failure on CRRT and progressive multiorgan failure  Severe ACUTE Hypoxic and Hypercapnic Respiratory Failure -continue Mechanical Ventilator support -Wean Fio2 and PEEP as tolerated -VAP/VENT bundle implementation - Wean PEEP & FiO2 as tolerated, maintain SpO2 > 88% - Head of bed elevated 30 degrees, VAP protocol in place - Plateau pressures less than 30 cm H20  - Intermittent chest x-ray & ABG PRN - Ensure adequate pulmonary hygiene  TRACH TODAY   Acute Hypoxic Respiratory Failure in the setting of worsening septic shock PMHx: COPD Refractory Septic Shock secondary to LLE necrotizing fasciitis Streptococcal toxic shock syndrome Weaning off pressors now   SEPTIC shock SOURCE-LLE Refractory Septic Shock secondary to LLE necrotizing fasciitis Streptococcal toxic shock syndrome -use vasopressors to keep MAP>65 as needed S/p IVIG  INFECTIOUS DISEASE -continue antibiotics as prescribed -follow up cultures -follow up ID consultation Group A strep necrotizing fasciitis L LE Streptococcal toxic shock - Continue antibiotics: Penicillin G/linezolid - Status postdebridement, wound VAC per surgery - Poor prognosis -Vascular and general surgery following S/p IVIG   ACUTE KIDNEY INJURY/Renal Failure -continue Foley Catheter-assess need -Avoid nephrotoxic agents -Follow urine output, BMP -Ensure adequate renal perfusion, optimize oxygenation -Renal dose medications Continue CRRT  Intake/Output Summary (Last 24 hours) at 05/17/2021 0734 Last data filed at 05/17/2021  0700 Gross per 24 hour  Intake 1814.24 ml  Output 4024 ml  Net -2209.76 ml    Acute encephalopathy  -Toxic metabolic encephalopathy - Loaded with keppra; continue at 1gram bid MRI/CT HEAD SHOWS SMALL VOLUME [place] APPRECIATE NEUROSURGERY RECS -Neurology consulted   ENDO - ICU hypoglycemic\Hyperglycemia protocol -check FSBS per protocol   GI GI PROPHYLAXIS as indicated Check LFT's   NUTRITIONAL STATUS DIET-->TF's as tolerated Constipation protocol as indicated   ELECTROLYTES -follow labs as needed -replace as needed -pharmacy consultation and following    Best practice:  Diet: NPO Pain/Anxiety/Delirium protocol (if indicated): Yes, initiated VAP protocol (if indicated): Yes, initiated DVT prophylaxis: Subcutaneous Heparin GI prophylaxis: PPI Glucose control: ICU hyperglycemia protocol, insulin drip discontinued; SQ insulin Central venous access:  Rt IJV Arterial line:  N/A Foley:  Yes Mobility:  bed rest  PT consulted: N/A Last date of multidisciplinary goals of care discussion [1/27] Code Status:  full code Disposition: ICU    Labs   CBC: Recent Labs  Lab 05/11/21 0531 05/11/21 1239 05/13/21 0311 05/13/21 1650 05/14/21 0410 05/14/21 1056 05/15/21 0313 05/16/21 0422 05/17/21 0350  WBC 42.8*   < > 34.8*  --  28.4*  --  24.5* 12.6* 11.4*  NEUTROABS 34.4*  --   --   --   --   --  22.7*  --   --   HGB 10.8*   < > 7.3*   < >  6.7* 8.0* 8.7* 9.5* 9.2*  HCT 33.2*   < > 22.2*   < > 20.5* 23.5* 26.5* 28.6* 28.0*  MCV 88.3   < > 87.7  --  86.5  --  88.0 85.9 87.5  PLT 219   < > 141*  --  121*  --  122* 144* 156   < > = values in this interval not displayed.     Basic Metabolic Panel: Recent Labs  Lab 05/13/21 0311 05/13/21 1650 05/14/21 0410 05/14/21 1555 05/15/21 0313 05/15/21 1708 05/16/21 0422 05/16/21 1646 05/17/21 0350  NA 131*   < > 133*   < > 131* 132* 134* 133* 135  K 4.0   < > 3.6   < > 3.8 3.3* 3.1* 3.4* 4.5  CL 98   < > 98   < > 98  99 101 100 102  CO2 26   < > 26   < > 24 21* 22 20* 21*  GLUCOSE 159*   < > 193*   < > 228* 241* 156* 124* 103*  BUN 36*   < > 33*   < > 48* 51* 45* 43* 44*  CREATININE 1.77*   < > 1.45*   < > 1.66* 1.43* 1.32* 1.20* 1.38*  CALCIUM 8.1*   < > 9.1   < > 9.2 9.3 9.6 8.7* 8.3*  MG 1.8  --  1.9  --  1.9  --  1.8  --  2.4  PHOS 3.4   < > 2.3*   < > 2.3* 2.6 2.4* 2.9 3.6   < > = values in this interval not displayed.    GFR: Estimated Creatinine Clearance: 48.7 mL/min (A) (by C-G formula based on SCr of 1.38 mg/dL (H)). Recent Labs  Lab 05/13/21 0311 05/14/21 0410 05/15/21 0313 05/16/21 0422 05/17/21 0350  PROCALCITON 20.94 21.12  --   --   --   WBC 34.8* 28.4* 24.5* 12.6* 11.4*     Liver Function Tests: Recent Labs  Lab 05/11/21 0531 05/11/21 1600 05/12/21 0431 05/12/21 1025 05/12/21 1603 05/13/21 0311 05/13/21 1650 05/15/21 0313 05/15/21 1708 05/16/21 0422 05/16/21 1646 05/17/21 0350  AST 4,359*  --  6,519* 6,250*  --  4,114*  --  639*  --   --   --   --   ALT 1,746*  --  1,613* 1,711*  --  1,400*  --  749*  --   --   --   --   ALKPHOS 185*  --  232* 262*  --  266*  --  328*  --   --   --   --   BILITOT 2.5*  --  3.0* 3.9*  --  4.0*  --  5.0*  --   --   --   --   PROT 4.6*  --  5.3* 6.3*  --  5.9*  --  6.6  --   --   --   --   ALBUMIN <1.5*   < > 2.1*   2.0* 2.5*   < > 2.4*   2.4*   < > 2.6*   2.6* 2.4* 2.2* 2.1* 1.9*   < > = values in this interval not displayed.    Coagulation Profile: Recent Labs  Lab 05/14/21 1056 05/16/21 0422 05/17/21 0350  INR 1.8* 1.3* 1.3*     Cardiac Enzymes: Recent Labs  Lab 05/12/21 1603  CKTOTAL 4,670*     HbA1C: Hgb A1c MFr Bld  Date/Time  Value Ref Range Status  05/05/2021 04:53 AM 6.7 (H) 4.8 - 5.6 % Final    Comment:    (NOTE)         Prediabetes: 5.7 - 6.4         Diabetes: >6.4         Glycemic control for adults with diabetes: <7.0   02/11/2021 09:41 AM 6.3 (H) <5.7 % of total Hgb Final    Comment:    For  someone without known diabetes, a hemoglobin  A1c value between 5.7% and 6.4% is consistent with prediabetes and should be confirmed with a  follow-up test. . For someone with known diabetes, a value <7% indicates that their diabetes is well controlled. A1c targets should be individualized based on duration of diabetes, age, comorbid conditions, and other considerations. . This assay result is consistent with an increased risk of diabetes. . Currently, no consensus exists regarding use of hemoglobin A1c for diagnosis of diabetes for children. .     CBG: Recent Labs  Lab 05/16/21 1601 05/16/21 1907 05/16/21 2316 05/17/21 0433 05/17/21 0729  GLUCAP 143* 115* 115* 107* 106*    No Known Allergies   Medications  Scheduled Meds:  arformoterol  15 mcg Nebulization BID   vitamin C  500 mg Per Tube BID   budesonide (PULMICORT) nebulizer solution  0.25 mg Nebulization BID   chlorhexidine gluconate (MEDLINE KIT)  15 mL Mouth Rinse BID   Chlorhexidine Gluconate Cloth  6 each Topical Q0600   docusate  100 mg Per Tube BID   feeding supplement (PROSource TF)  45 mL Per Tube TID   free water  30 mL Per Tube Q4H   insulin aspart  1-3 Units Subcutaneous Q4H   insulin detemir  38 Units Subcutaneous BID   levETIRAcetam  1,000 mg Per Tube BID   linezolid  600 mg Per Tube Q12H   magic mouthwash  10 mL Oral QID   mouth rinse  15 mL Mouth Rinse 10 times per day   midodrine  10 mg Per Tube TID WC   multivitamin  1 tablet Per Tube QHS   nutrition supplement (JUVEN)  1 packet Per Tube BID BM   pantoprazole (PROTONIX) IV  40 mg Intravenous Q24H   polyethylene glycol  17 g Per Tube Daily   revefenacin  175 mcg Nebulization Daily   sodium chloride flush  10-40 mL Intracatheter Q12H   zinc sulfate  220 mg Per Tube Daily   Continuous Infusions:   prismasol BGK 4/2.5 500 mL/hr at 05/17/21 0459    prismasol BGK 4/2.5 500 mL/hr at 05/17/21 0459   sodium chloride Stopped (05/13/21 2154)    amiodarone 30 mg/hr (05/17/21 0700)   cefTRIAXone (ROCEPHIN)  IV Stopped (05/16/21 1700)   feeding supplement (VITAL AF 1.2 CAL) Stopped (05/17/21 0000)   HYDROmorphone 0.5 mg/hr (05/17/21 0700)   phenylephrine (NEO-SYNEPHRINE) Adult infusion     prismasol BGK 4/2.5 2,000 mL/hr at 05/17/21 0500   PRN Meds:.artificial tears, docusate sodium, fentaNYL (SUBLIMAZE) injection, heparin, HYDROmorphone, lip balm, midazolam, ondansetron (ZOFRAN) IV, oxyCODONE, sennosides, sodium chloride, sodium chloride flush      DVT/GI PRX  assessed I Assessed the need for Labs I Assessed the need for Foley I Assessed the need for Central Venous Line Family Discussion when available I Assessed the need for Mobilization I made an Assessment of medications to be adjusted accordingly Safety Risk assessment completed  CASE DISCUSSED IN MULTIDISCIPLINARY ROUNDS WITH ICU TEAM  Critical Care Time devoted to patient care services described in this note is 55 minutes.  Critical care was necessary to treat /prevent imminent and life-threatening deterioration. Overall, patient is critically ill, prognosis is guarded.  Patient with Multiorgan failure and at high risk for cardiac arrest and death.    Corrin Parker, M.D.  [facility]  Medical Director [place] Director [facility] Cardio-Pulmonary Department

## 2021-05-17 NOTE — Anesthesia Preprocedure Evaluation (Signed)
Anesthesia Evaluation  Patient identified by MRN, date of birth, ID band Patient awake    Reviewed: Allergy & Precautions, NPO status , Patient's Chart, lab work & pertinent test results  History of Anesthesia Complications Negative for: history of anesthetic complications  Airway Mallampati: Intubated       Dental  (+) Dental Advidsory Given   Pulmonary shortness of breath, asthma , sleep apnea , COPD, former smoker,     + decreased breath sounds      Cardiovascular hypertension, Normal cardiovascular exam     Neuro/Psych negative neurological ROS  negative psych ROS   GI/Hepatic Neg liver ROS, GERD  ,(+) Hepatitis -  Endo/Other  diabetes  Renal/GU Renal disease     Musculoskeletal   Abdominal   Peds  Hematology negative hematology ROS (+)   Anesthesia Other Findings Past Medical History: No date: AKI (acute kidney injury) ([place])     Comment:  a. 04/2021 in setting of bacteremia/shock. No date: Arthritis No date: Asthma No date: Bacteremia     Comment:  a. 04/2021 S pyogenes bacteremia in setting of lower ext               cellulitis. No date: COPD (chronic obstructive pulmonary disease) ([facility]) No date: Diabetes mellitus without complication ([facility]) No date: Endometriosis No date: GERD (gastroesophageal reflux disease) No date: History of echocardiogram     Comment:  a. 07/2013 Echo: EF 55-60%, impaired relaxation, mild TR;              b. 04/2021 Echo: EF 50-55%, mild LVH, nl RV fxn, mild BAE,              Ao sclerosis w/o stenosis. No date: Hypertension No date: Obesity No date: PAF (paroxysmal atrial fibrillation) ([place])     Comment:  a. 04/2021 in setting of septic shock/cellulitis. No date: Sleep apnea     Comment:  CPAP  Past Surgical History: No date: ABDOMINAL HYSTERECTOMY 3/71/6967: APPLICATION OF WOUND VAC     Comment:  Procedure: APPLICATION OF WOUND VAC;  Surgeon:               Herbert Pun, MD;  Location: [facility] ORS;  Service:              General;; 10/29/2006: COLONOSCOPY     Comment:  Dr Allen Norris 11/05/2016: COLONOSCOPY WITH PROPOFOL; N/A     Comment:  Procedure: COLONOSCOPY WITH PROPOFOL;  Surgeon: Robert Bellow, MD;  Location: [facility] ENDOSCOPY;  Service:               Endoscopy;  Laterality: N/A; 05/10/2021: INCISION AND DRAINAGE OF WOUND; Left     Comment:  Procedure: IRRIGATION AND DEBRIDEMENT LEFT LEG;                Surgeon: Herbert Pun, MD;  Location: [facility] ORS;               Service: General;  Laterality: Left; 2002: NASAL SINUS SURGERY     Comment:  Dr Carlis Abbott 05/07/2021: WOUND DEBRIDEMENT; Left     Comment:  Procedure: DEBRIDEMENT WOUND;  Surgeon: Herbert Pun, MD;  Location: [facility] ORS;  Service: General;                Laterality: Left;  BMI    Body Mass Index: 45.49 kg/m  Reproductive/Obstetrics negative OB ROS                             Anesthesia Physical Anesthesia Plan  ASA: 4  Anesthesia Plan: General ETT   Post-op Pain Management:    Induction: Intravenous  PONV Risk Score and Plan: Ondansetron, Dexamethasone, Midazolam and Treatment may vary due to age or medical condition  Airway Management Planned: Oral ETT  Additional Equipment:   Intra-op Plan:   Post-operative Plan: Post-operative intubation/ventilation  Informed Consent: I have reviewed the patients History and Physical, chart, labs and discussed the procedure including the risks, benefits and alternatives for the proposed anesthesia with the patient or authorized representative who has indicated his/her understanding and acceptance.     Dental Advisory Given  Plan Discussed with: Anesthesiologist, CRNA and Surgeon  Anesthesia Plan Comments: (History and phone consent from the patients husband [name] at [phone]   Husband consented for risks of anesthesia including but not limited to:   - adverse reactions to medications - damage to eyes, teeth, lips or other oral mucosa - nerve damage due to positioning  - sore throat or hoarseness - Damage to heart, brain, nerves, lungs, other parts of body or loss of life  He voiced understanding.)        Anesthesia Quick Evaluation

## 2021-05-17 NOTE — Op Note (Signed)
ATTENDING Surgeon(s): Herbert Pun, MD   ANESTHESIA: General   PRE-OPERATIVE DIAGNOSIS: Left leg open wound   POST-OPERATIVE DIAGNOSIS: Left leg open wound   PROCEDURE(S):  1.) Surgical preparation and creation of recipient site by excision of open wound for skin substitute 2.) Implantation of cellular matrix on left leg 3.) Negative pressure dressing DME (1400 sq cm)   INTRAOPERATIVE FINDINGS:  A 40 x 35 cm left leg wound (1400 sq cm) Implantation of Myriad Matrix sheets:  - 20 x 20 cm (1) - 10 x 20 cm (2) - 10 x 10 cm (1)  Total coverage with extracellular matrix (700 sq cm)   ESTIMATED BLOOD LOSS: 0 mL    SPECIMENS: None   COMPLICATIONS: None apparent   CONDITION AT END OF PROCEDURE: Critically ill   INDICATIONS FOR PROCEDURE:  Patient is 71 year old female with large complex left leg wound. Extracellular matrix implant indicated to aid in the healing process.    DETAILS OF PROCEDURE: After informed consent,patient was taken to the OR. Time out performed. General anesthesia induced. Patient placed on supine position. Previous wound VAC was removed. The left leg  was cleaned and draped in sterile fashion.  With scissors and cautery the wound was debrided for preparation for implantation of extracellular matrix.  Once adequate muscular, tendon and bone tissue was achieved multiple layers of myriad matrix and Myriad Morcells were implanted and fixated to the tissue with 3-0 Vicryl.  And new negative pressure dressing was applied in layers.  Adequate negative pressure dressing was achieved.

## 2021-05-17 NOTE — Progress Notes (Signed)
Patient is on neo and vaso to maintain map greater then 65. CRRT restarted with no complications. Albumin given.Trach placed this afternoon and cellular matrix per surgery. Foley and rectal tube intact. Family updated throughout the day. KUB pending, placed during trach in the OR per MD. Potassium 5.1, per Dr. Holley Raring give one dose of Lokelma. Continue to assess.

## 2021-05-17 NOTE — Progress Notes (Signed)
During trach placement, MD placed right NG. Order for KUB. Renal panel back, potassium 5.1, sodium 133. Dr. Holley Raring notified and orders for [facility]. Per MD does not want to switch 4k bath. If potassium gets above 5.5 orders to switch patient back to the 2k bath.

## 2021-05-17 NOTE — Progress Notes (Signed)
Date of Admission:  05/04/2021     ID: Alexandra Foster is a 71 y.o. female Principal Problem:   Severe sepsis with septic shock (CODE) ([place]) Active Problems:   COPD (chronic obstructive pulmonary disease) ([place])   Morbid obesity ([place])   Diabetes mellitus type 2 in obese ([place])   Atrial fibrillation and flutter (HCC)   Necrotizing fasciitis ([place])   Acute on chronic respiratory failure ([place])   On mechanically assisted ventilation ([place])   Shock liver   Anemia associated with acute blood loss   Metabolic acidosis   Encounter for continuous renal replacement therapy (CRRT) for acute renal failure (HCC)   Acute encephalopathy   Group A streptococcal infection   Streptococcal toxic shock syndrome (HCC)    Subjective: Had tracheotomy today On 2 pressors Medications:   arformoterol  15 mcg Nebulization BID   vitamin C  500 mg Per Tube BID   budesonide (PULMICORT) nebulizer solution  0.25 mg Nebulization BID   chlorhexidine gluconate (MEDLINE KIT)  15 mL Mouth Rinse BID   Chlorhexidine Gluconate Cloth  6 each Topical Q0600   docusate  100 mg Per Tube BID   feeding supplement (PROSource TF)  45 mL Per Tube TID   free water  30 mL Per Tube Q4H   hydrocortisone sod succinate (SOLU-CORTEF) inj  100 mg Intravenous Q8H   insulin aspart  1-3 Units Subcutaneous Q4H   insulin detemir  38 Units Subcutaneous BID   levETIRAcetam  1,000 mg Per Tube BID   linezolid  600 mg Per Tube Q12H   magic mouthwash  10 mL Oral QID   mouth rinse  15 mL Mouth Rinse 10 times per day   midodrine  10 mg Per Tube TID WC   multivitamin  1 tablet Per Tube QHS   nutrition supplement (JUVEN)  1 packet Per Tube BID BM   pantoprazole (PROTONIX) IV  40 mg Intravenous Q24H   polyethylene glycol  17 g Per Tube Daily   revefenacin  175 mcg Nebulization Daily   sodium chloride flush  10-40 mL Intracatheter Q12H   zinc sulfate  220 mg Per Tube Daily    Objective: Vital signs in last 24 hours: Temp:  [95.2 F  (35.1 C)-100.4 F (38 C)] 98.4 F (36.9 C) (02/03 1030) Pulse Rate:  [72-95] 84 (02/03 1030) Resp:  [13-27] 13 (02/03 1030) BP: (93-135)/(45-90) 102/46 (02/03 1030) SpO2:  [98 %-100 %] 100 % (02/03 1111) FiO2 (%):  [30 %-40 %] 30 % (02/03 1111) Weight:  [546 kg] 124 kg (02/03 0445)  PHYSICAL EXAM:  General: tracheostomy, on the vent, on dilaudid On calling her name she opened her eyes but no other response  Head: Normocephalic, without obvious abnormality, atraumatic. Eyes: chemosis Oral cavity tongue has dried blood Neck: Bilateral IJ lines tracheostomy No carotid bruit and no JVD. Lungs: Bilateral air entry Heart: Regular rate and rhythm, no murmur, rub or gallop. Abdomen: Soft, non-tender,not distended. Bowel sounds normal. No masses Extremities: Right lower extremity wound VAC present  Picture reviewed   skin: left forearm blister at the site of previous line Edema legs and arms better Lymph: Cervical, supraclavicular normal. Neurologic: Cannot be assessed  Lab Results Recent Labs    05/16/21 0422 05/16/21 1646 05/17/21 0350  WBC 12.6*  --  11.4*  HGB 9.5*  --  9.2*  HCT 28.6*  --  28.0*  NA 134* 133* 135  K 3.1* 3.4* 4.5  CL 101 100 102  CO2  22 20* 21*  BUN 45* 43* 44*  CREATININE 1.32* 1.20* 1.38*   Liver Panel Recent Labs    05/15/21 0313 05/15/21 1708 05/16/21 1646 05/17/21 0350  PROT 6.6  --   --  6.2*  ALBUMIN 2.6*   2.6*   < > 2.1* 1.9*   1.9*  AST 639*  --   --  163*  ALT 749*  --   --  326*  ALKPHOS 328*  --   --  452*  BILITOT 5.0*  --   --  3.6*  BILIDIR 3.5*  --   --  2.4*  IBILI 1.5*  --   --  1.2*   < > = values in this interval not displayed.   Sedimentation Rate No results for input(s): ESRSEDRATE in the last 72 hours.  C-Reactive Protein No results for input(s): CRP in the last 72 hours.  Microbiology:  05/04/21 BC Group A strep 4/4 1of 4 staph epidermidis- a contaminant 05/07/21- Wound culture NG 05/09/21  BC-NG Studies/Results: ECHOCARDIOGRAM COMPLETE  Result Date: 05/16/2021    ECHOCARDIOGRAM REPORT   Patient Name:   [facility] Date of Exam: 05/16/2021 Medical Rec #:  [id number]           Height:       65.0 in Accession #:    [id number]          Weight:       273.4 lb Date of Birth:  11-07-1950           BSA:          2.259 m Patient Age:    71 years            BP:           115/50 mmHg Patient Gender: F                   HR:           75 bpm. Exam Location:  [facility] Procedure: 2D Echo, Color Doppler and Cardiac Doppler Indications:     R94.31 Abnormal ECG  History:         Patient has prior history of Echocardiogram examinations, most                  recent 05/06/2021. COPD; Risk Factors:Diabetes and Hypertension.  Sonographer:     Charmayne Sheer Referring Phys:  [id number] Flora Lipps Diagnosing Phys: Ida Rogue MD  Sonographer Comments: Echo performed with patient supine and on artificial respirator and Technically difficult study due to poor echo windows. Image acquisition challenging due to patient body habitus and Image acquisition challenging due to COPD. IMPRESSIONS  1. Left ventricular ejection fraction, by estimation, is 60 to 65%. The left ventricle has normal function. The left ventricle has no regional wall motion abnormalities. There is mild left ventricular hypertrophy. Left ventricular diastolic parameters are consistent with Grade I diastolic dysfunction (impaired relaxation).  2. Right ventricular systolic function is normal. The right ventricular size is normal. There is mildly elevated pulmonary artery systolic pressure. The estimated right ventricular systolic pressure is 70.2 mmHg.  3. The mitral valve is normal in structure. No evidence of mitral valve regurgitation. No evidence of mitral stenosis.  4. The aortic valve is normal in structure. Aortic valve regurgitation is not visualized. No aortic stenosis is present.  5. The inferior vena cava is normal in size with greater than 50%  respiratory variability, suggesting right atrial pressure of 3 mmHg. FINDINGS  Left Ventricle: Left ventricular ejection fraction, by estimation, is 60 to 65%. The left ventricle has normal function. The left ventricle has no regional wall motion abnormalities. The left ventricular internal cavity size was normal in size. There is  mild left ventricular hypertrophy. Left ventricular diastolic parameters are consistent with Grade I diastolic dysfunction (impaired relaxation). Right Ventricle: The right ventricular size is normal. No increase in right ventricular wall thickness. Right ventricular systolic function is normal. There is mildly elevated pulmonary artery systolic pressure. The tricuspid regurgitant velocity is 3.16  m/s, and with an assumed right atrial pressure of 5 mmHg, the estimated right ventricular systolic pressure is 95.0 mmHg. Left Atrium: Left atrial size was normal in size. Right Atrium: Right atrial size was normal in size. Pericardium: There is no evidence of pericardial effusion. Mitral Valve: The mitral valve is normal in structure. No evidence of mitral valve regurgitation. No evidence of mitral valve stenosis. MV peak gradient, 3.7 mmHg. The mean mitral valve gradient is 2.0 mmHg. Tricuspid Valve: The tricuspid valve is normal in structure. Tricuspid valve regurgitation is mild . No evidence of tricuspid stenosis. Aortic Valve: The aortic valve is normal in structure. Aortic valve regurgitation is not visualized. No aortic stenosis is present. Aortic valve mean gradient measures 6.0 mmHg. Aortic valve peak gradient measures 9.9 mmHg. Aortic valve area, by VTI measures 1.95 cm. Pulmonic Valve: The pulmonic valve was normal in structure. Pulmonic valve regurgitation is mild. No evidence of pulmonic stenosis. Aorta: The aortic root is normal in size and structure. Venous: The inferior vena cava is normal in size with greater than 50% respiratory variability, suggesting right atrial pressure  of 3 mmHg. IAS/Shunts: No atrial level shunt detected by color flow Doppler.  LEFT VENTRICLE PLAX 2D LVIDd:         4.57 cm   Diastology LVIDs:         3.74 cm   LV e' medial:    5.66 cm/s LV PW:         1.01 cm   LV E/e' medial:  10.6 LV IVS:        0.73 cm   LV e' lateral:   4.13 cm/s LVOT diam:     1.80 cm   LV E/e' lateral: 14.6 LV SV:         47 LV SV Index:   21 LVOT Area:     2.54 cm  LEFT ATRIUM           Index LA diam:      3.20 cm 1.42 cm/m LA Vol (A4C): 42.8 ml 18.95 ml/m  AORTIC VALVE                     PULMONIC VALVE AV Area (Vmax):    2.07 cm      PV Vmax:          1.32 m/s AV Area (Vmean):   1.83 cm      PV Vmean:         97.400 cm/s AV Area (VTI):     1.95 cm      PV VTI:           0.208 m AV Vmax:           157.00 cm/s   PV Peak grad:     7.0 mmHg AV Vmean:          108.000 cm/s  PV Mean grad:     4.0 mmHg AV VTI:  0.240 m       PR End Diast Vel: 6.66 msec AV Peak Grad:      9.9 mmHg AV Mean Grad:      6.0 mmHg LVOT Vmax:         128.00 cm/s LVOT Vmean:        77.800 cm/s LVOT VTI:          0.184 m LVOT/AV VTI ratio: 0.77  AORTA Ao Root diam: 2.60 cm MITRAL VALVE               TRICUSPID VALVE MV Area (PHT): 3.27 cm    TR Peak grad:   39.9 mmHg MV Area VTI:   1.81 cm    TR Vmax:        316.00 cm/s MV Peak grad:  3.7 mmHg MV Mean grad:  2.0 mmHg    SHUNTS MV Vmax:       0.96 m/s    Systemic VTI:  0.18 m MV Vmean:      56.7 cm/s   Systemic Diam: 1.80 cm MV Decel Time: 232 msec MV E velocity: 60.10 cm/s MV A velocity: 67.90 cm/s MV E/A ratio:  0.89 Ida Rogue MD Electronically signed by Ida Rogue MD Signature Date/Time: 05/16/2021/3:38:58 PM    Final      Assessment/Plan:  Streptococcus pyogenes bacteremia with septic shock and multiorgan failure.  Left leg necrotizing fasciitis. Status post debridement and due to on 05/07/2021 and 05/10/2021.  Now has a wound VAC.  Patient is currently on ceftriaxone and linezolid.  She was on penicillin but because of fluid overload it  was changed to ceftriaxone on 05/14/2021. She also received 3 doses of IVIG She is followed by Surgeon and underwent vac change today  Acute hypoxic respiratory failure. Tracheostomy done today  Leukocytosis almost back to normal.   Encephalopathy MRI showed small subarachnoid hemorrhage.  MRA was normal  AKI a combination of septic shock and hypovolemic shock.  On CRRT since 05/11/2021. No urine output   Transaminitis and hyperbilirubinemia secondary to shock has improved  Anemia has received PRBC    The blood loss is from the left leg- improvd now  Severe hypoalbuminemia with third spacing.  improving  Diabetes mellitus On sliding scale  A. fib now in sinus rhythm  on amiodarone  Discussed the management with the nurse and the care team. ID will follow her peripherally this weekend , call if needed

## 2021-05-17 NOTE — Consult Note (Signed)
PHARMACY CONSULT NOTE - FOLLOW UP  Pharmacy Consult for Electrolyte Monitoring and Replacement   Recent Labs: Potassium (mmol/L)  Date Value  05/17/2021 4.5  08/11/2013 3.3 (L)   Magnesium (mg/dL)  Date Value  05/17/2021 2.4   Calcium (mg/dL)  Date Value  05/17/2021 8.3 (L)   Calcium, Total (mg/dL)  Date Value  08/11/2013 9.2   Albumin (g/dL)  Date Value  05/17/2021 1.9 (L)  08/11/2013 3.1 (L)   Phosphorus (mg/dL)  Date Value  05/17/2021 3.6   Sodium (mmol/L)  Date Value  05/17/2021 135  08/11/2013 138   Assessment: 71 yo female with a previous medical history of diabetes mellitus type 2, hypertension, obstructive sleep apnea on CPAP, asthma/COPD, GERD, history of COVID-19 pneumonia presenting with lower left extremity pain and chills. Patient found to have severe sepsis and strep pyogenes bacteremia on admission and is currently intubated and sedated. Pharmacy has been consulted to monitor and replace electrolytes.  Nutrition:  --Feeds per tube --Free water per tube 30 mL Q4H  Goal of Therapy:  Electrolytes within normal limits  Plan:  --No replacement required at this time  --Follow-up with evening labs  [facility] 05/17/2021 7:57 AM

## 2021-05-17 NOTE — Progress Notes (Addendum)
Progress Note  Patient Name: Alexandra Foster Date of Encounter: 05/17/2021  [facility] Cardiologist: Kathlyn Sacramento, MD   Subjective   Patient intubated,  On pressors, blood pressure stable Short episode atrial fibrillation yesterday evening otherwise remaining in normal sinus rhythm Plan for trach today per ICU team   Inpatient Medications    Scheduled Meds:  [MAR Hold] arformoterol  15 mcg Nebulization BID   [MAR Hold] vitamin C  500 mg Per Tube BID   [MAR Hold] budesonide (PULMICORT) nebulizer solution  0.25 mg Nebulization BID   [MAR Hold] chlorhexidine gluconate (MEDLINE KIT)  15 mL Mouth Rinse BID   [MAR Hold] Chlorhexidine Gluconate Cloth  6 each Topical Q0600   [MAR Hold] docusate  100 mg Per Tube BID   [MAR Hold] feeding supplement (PROSource TF)  45 mL Per Tube TID   [MAR Hold] free water  30 mL Per Tube Q4H   [MAR Hold] hydrocortisone sod succinate (SOLU-CORTEF) inj  100 mg Intravenous Q8H   [MAR Hold] insulin aspart  1-3 Units Subcutaneous Q4H   [MAR Hold] insulin detemir  38 Units Subcutaneous BID   [MAR Hold] levETIRAcetam  1,000 mg Per Tube BID   [MAR Hold] linezolid  600 mg Per Tube Q12H   [MAR Hold] magic mouthwash  10 mL Oral QID   [MAR Hold] mouth rinse  15 mL Mouth Rinse 10 times per day   [facility] Hold] midodrine  10 mg Per Tube TID WC   [MAR Hold] multivitamin  1 tablet Per Tube QHS   [MAR Hold] nutrition supplement (JUVEN)  1 packet Per Tube BID BM   [MAR Hold] pantoprazole (PROTONIX) IV  40 mg Intravenous Q24H   [MAR Hold] polyethylene glycol  17 g Per Tube Daily   [MAR Hold] revefenacin  175 mcg Nebulization Daily   [MAR Hold] sodium chloride flush  10-40 mL Intracatheter Q12H   [MAR Hold] zinc sulfate  220 mg Per Tube Daily   Continuous Infusions:   prismasol BGK 4/2.5 500 mL/hr at 05/17/21 0459    prismasol BGK 4/2.5 500 mL/hr at 05/17/21 0459   sodium chloride Stopped (05/13/21 2154)   [MAR Hold] albumin human 25 g (05/17/21 1131)    amiodarone 30 mg/hr (05/17/21 1250)   [MAR Hold] cefTRIAXone (ROCEPHIN)  IV Stopped (05/16/21 1700)   feeding supplement (VITAL AF 1.2 CAL) Stopped (05/17/21 0000)   HYDROmorphone Stopped (05/17/21 1258)   [MAR Hold] phenylephrine     prismasol BGK 4/2.5 2,000 mL/hr at 05/17/21 1023   vasopressin 0.04 Units/min (05/17/21 1301)   PRN Meds: [MAR Hold] artificial tears, [MAR Hold] docusate sodium, [MAR Hold] fentaNYL (SUBLIMAZE) injection, [MAR Hold] heparin, [MAR Hold] HYDROmorphone, lidocaine-EPINEPHrine, [MAR Hold] lip balm, [MAR Hold] midazolam, [MAR Hold] ondansetron (ZOFRAN) IV, [MAR Hold] oxyCODONE, [MAR Hold] sennosides, [MAR Hold] sodium chloride, [MAR Hold] sodium chloride flush   Vital Signs    Vitals:   05/17/21 1030 05/17/21 1100 05/17/21 1111 05/17/21 1130  BP: (!) 102/46 (!) 116/57  (!) 124/47  Pulse: 84 79  81  Resp: '13 16  20  ' Temp: 98.4 F (36.9 C) 98.4 F (36.9 C)  98.8 F (37.1 C)  TempSrc:      SpO2: 99% 99% 100% 100%  Weight:      Height:        Intake/Output Summary (Last 24 hours) at 05/17/2021 1422 Last data filed at 05/17/2021 1309 Gross per 24 hour  Intake 1465.61 ml  Output 4305 ml  Net -2839.39  ml   Last 3 Weights 05/17/2021 05/16/2021 05/15/2021  Weight (lbs) 273 lb 5.9 oz 273 lb 5.9 oz 283 lb 1.1 oz  Weight (kg) 124 kg 124 kg 128.4 kg      Telemetry    Predominantly normal sinus rhythm, brief episode atrial fibrillation yesterday evening  ECG     - Personally Reviewed  Physical Exam   Constitutional: Intubated, sedated, obese HENT:  Head: Grossly normal Eyes:  no discharge. No scleral icterus.  Neck: No JVD, no carotid bruits  Cardiovascular: Regular rate and rhythm, no murmurs appreciated Pulmonary/Chest: Clear to auscultation bilaterally, no wheezes or rails Abdominal: Soft.  no distension.   Musculoskeletal: Unable to test, left leg with wound VAC in place Neurological: Unable to test Skin: Skin warm and dry Psychiatric: Unable to  test   Labs    High Sensitivity Troponin:   Recent Labs  Lab 05/15/21 1708 05/15/21 1926  TROPONINIHS 338* 305*     Chemistry Recent Labs  Lab 05/13/21 0311 05/13/21 1650 05/15/21 0313 05/15/21 1708 05/16/21 0422 05/16/21 1646 05/17/21 0350  NA 131*   < > 131*   < > 134* 133* 135  K 4.0   < > 3.8   < > 3.1* 3.4* 4.5  CL 98   < > 98   < > 101 100 102  CO2 26   < > 24   < > 22 20* 21*  GLUCOSE 159*   < > 228*   < > 156* 124* 103*  BUN 36*   < > 48*   < > 45* 43* 44*  CREATININE 1.77*   < > 1.66*   < > 1.32* 1.20* 1.38*  CALCIUM 8.1*   < > 9.2   < > 9.6 8.7* 8.3*  MG 1.8   < > 1.9  --  1.8  --  2.4  PROT 5.9*  --  6.6  --   --   --  6.2*  ALBUMIN 2.4*   2.4*   < > 2.6*   2.6*   < > 2.2* 2.1* 1.9*   1.9*  AST 4,114*  --  639*  --   --   --  163*  ALT 1,400*  --  749*  --   --   --  326*  ALKPHOS 266*  --  328*  --   --   --  452*  BILITOT 4.0*  --  5.0*  --   --   --  3.6*  GFRNONAA 30*   < > 33*   < > 43* 48* 41*  ANIONGAP 7   < > 9   < > '11 13 12   ' < > = values in this interval not displayed.    Lipids  Recent Labs  Lab 05/15/21 0313  TRIG 121    Hematology Recent Labs  Lab 05/15/21 0313 05/16/21 0422 05/17/21 0350  WBC 24.5* 12.6* 11.4*  RBC 3.01* 3.33* 3.20*  HGB 8.7* 9.5* 9.2*  HCT 26.5* 28.6* 28.0*  MCV 88.0 85.9 87.5  MCH 28.9 28.5 28.8  MCHC 32.8 33.2 32.9  RDW 19.8* 19.9* 20.3*  PLT 122* 144* 156   Thyroid  Recent Labs  Lab 05/13/21 0311 05/14/21 0410  TSH 0.196*  --   FREET4  --  0.48*    BNPNo results for input(s): BNP, PROBNP in the last 168 hours.  DDimer No results for input(s): DDIMER in the last 168 hours.   Radiology  ECHOCARDIOGRAM COMPLETE  Result Date: 05/16/2021    ECHOCARDIOGRAM REPORT   Patient Name:   Alexandra Foster Date of Exam: 05/16/2021 Medical Rec #:  [id number]           Height:       65.0 in Accession #:    [id number]          Weight:       273.4 lb Date of Birth:  29-Jun-1950           BSA:          2.259 m  Patient Age:    71 years            BP:           115/50 mmHg Patient Gender: F                   HR:           75 bpm. Exam Location:  [facility] Procedure: 2D Echo, Color Doppler and Cardiac Doppler Indications:     R94.31 Abnormal ECG  History:         Patient has prior history of Echocardiogram examinations, most                  recent 05/06/2021. COPD; Risk Factors:Diabetes and Hypertension.  Sonographer:     Charmayne Sheer Referring Phys:  [id number] Flora Lipps Diagnosing Phys: Ida Rogue MD  Sonographer Comments: Echo performed with patient supine and on artificial respirator and Technically difficult study due to poor echo windows. Image acquisition challenging due to patient body habitus and Image acquisition challenging due to COPD. IMPRESSIONS  1. Left ventricular ejection fraction, by estimation, is 60 to 65%. The left ventricle has normal function. The left ventricle has no regional wall motion abnormalities. There is mild left ventricular hypertrophy. Left ventricular diastolic parameters are consistent with Grade I diastolic dysfunction (impaired relaxation).  2. Right ventricular systolic function is normal. The right ventricular size is normal. There is mildly elevated pulmonary artery systolic pressure. The estimated right ventricular systolic pressure is 02.6 mmHg.  3. The mitral valve is normal in structure. No evidence of mitral valve regurgitation. No evidence of mitral stenosis.  4. The aortic valve is normal in structure. Aortic valve regurgitation is not visualized. No aortic stenosis is present.  5. The inferior vena cava is normal in size with greater than 50% respiratory variability, suggesting right atrial pressure of 3 mmHg. FINDINGS  Left Ventricle: Left ventricular ejection fraction, by estimation, is 60 to 65%. The left ventricle has normal function. The left ventricle has no regional wall motion abnormalities. The left ventricular internal cavity size was normal in size. There is  mild left  ventricular hypertrophy. Left ventricular diastolic parameters are consistent with Grade I diastolic dysfunction (impaired relaxation). Right Ventricle: The right ventricular size is normal. No increase in right ventricular wall thickness. Right ventricular systolic function is normal. There is mildly elevated pulmonary artery systolic pressure. The tricuspid regurgitant velocity is 3.16  m/s, and with an assumed right atrial pressure of 5 mmHg, the estimated right ventricular systolic pressure is 37.8 mmHg. Left Atrium: Left atrial size was normal in size. Right Atrium: Right atrial size was normal in size. Pericardium: There is no evidence of pericardial effusion. Mitral Valve: The mitral valve is normal in structure. No evidence of mitral valve regurgitation. No evidence of mitral valve stenosis. MV peak gradient, 3.7 mmHg. The mean mitral valve gradient is 2.0 mmHg. Tricuspid  Valve: The tricuspid valve is normal in structure. Tricuspid valve regurgitation is mild . No evidence of tricuspid stenosis. Aortic Valve: The aortic valve is normal in structure. Aortic valve regurgitation is not visualized. No aortic stenosis is present. Aortic valve mean gradient measures 6.0 mmHg. Aortic valve peak gradient measures 9.9 mmHg. Aortic valve area, by VTI measures 1.95 cm. Pulmonic Valve: The pulmonic valve was normal in structure. Pulmonic valve regurgitation is mild. No evidence of pulmonic stenosis. Aorta: The aortic root is normal in size and structure. Venous: The inferior vena cava is normal in size with greater than 50% respiratory variability, suggesting right atrial pressure of 3 mmHg. IAS/Shunts: No atrial level shunt detected by color flow Doppler.  LEFT VENTRICLE PLAX 2D LVIDd:         4.57 cm   Diastology LVIDs:         3.74 cm   LV e' medial:    5.66 cm/s LV PW:         1.01 cm   LV E/e' medial:  10.6 LV IVS:        0.73 cm   LV e' lateral:   4.13 cm/s LVOT diam:     1.80 cm   LV E/e' lateral: 14.6 LV SV:          47 LV SV Index:   21 LVOT Area:     2.54 cm  LEFT ATRIUM           Index LA diam:      3.20 cm 1.42 cm/m LA Vol (A4C): 42.8 ml 18.95 ml/m  AORTIC VALVE                     PULMONIC VALVE AV Area (Vmax):    2.07 cm      PV Vmax:          1.32 m/s AV Area (Vmean):   1.83 cm      PV Vmean:         97.400 cm/s AV Area (VTI):     1.95 cm      PV VTI:           0.208 m AV Vmax:           157.00 cm/s   PV Peak grad:     7.0 mmHg AV Vmean:          108.000 cm/s  PV Mean grad:     4.0 mmHg AV VTI:            0.240 m       PR End Diast Vel: 6.66 msec AV Peak Grad:      9.9 mmHg AV Mean Grad:      6.0 mmHg LVOT Vmax:         128.00 cm/s LVOT Vmean:        77.800 cm/s LVOT VTI:          0.184 m LVOT/AV VTI ratio: 0.77  AORTA Ao Root diam: 2.60 cm MITRAL VALVE               TRICUSPID VALVE MV Area (PHT): 3.27 cm    TR Peak grad:   39.9 mmHg MV Area VTI:   1.81 cm    TR Vmax:        316.00 cm/s MV Peak grad:  3.7 mmHg MV Mean grad:  2.0 mmHg    SHUNTS MV Vmax:       0.96 m/s  Systemic VTI:  0.18 m MV Vmean:      56.7 cm/s   Systemic Diam: 1.80 cm MV Decel Time: 232 msec MV E velocity: 60.10 cm/s MV A velocity: 67.90 cm/s MV E/A ratio:  0.89 Ida Rogue MD Electronically signed by Ida Rogue MD Signature Date/Time: 05/16/2021/3:38:58 PM    Final     Cardiac Studies   Echocardiogram performed today showing normal LV function, normal RV function, no significant valvular heart disease, mild to moderately elevated right heart pressure  Patient Profile     Sundee Garland is a 71 y.o. female with a history of asthma/COPD, HTN, DMII, obesity, OSA on CPAP, GERD, and COVID-19 pneumonia, who is being seen today for the evaluation of afib w/ RVR in the setting of admission for lower extremity cellulitis, S pyogenes bacteremia, shock, and AKI   Assessment & Plan    1.  Afib w/ RVR:   Very brief episode atrial fibrillation, but predominately normal sinus rhythm on amiodarone infusion High risk of  recurrent arrhythmia -On heparin infusion CHA2DS2VASc = 4.   -Receiving heparin infusion through CRRT -Normal ejection fraction on echo Would continue amiodarone infusion , consider transition to oral amiodarone once extubated, on trach, with PEG tube ,having bowel movements  2.  Septic shock/S pyogenes bacteremia/L lower extremity cellulitis:   Intubated, on pressors Has had radical debridement January 24, January 27, wound VAC in place Surgery following  3.  AKI:/ATN Oliguric ATN in the setting of sepsis /shock requiring CRRT Heparin   4.  DMII:   per IM/CCM.   5.  Normocytic anemia:   Stable, hemoglobin 9.2  6.  Bradycardia For any recurrent episodes would hold sedation If bradycardia persists may need to briefly hold amiodarone  Case discussed with nursing, ICU team  Total encounter time more than 50 minutes  Greater than 50% was spent in counseling and coordination of care with the patient    For questions or updates, please contact [place] Please consult [email or web address] for contact info under        Signed, Ida Rogue, MD  05/17/2021, 2:22 PM

## 2021-05-17 NOTE — Transfer of Care (Signed)
Immediate Anesthesia Transfer of Care Note  Patient: Alexandra Foster  Procedure(s) Performed: TRACHEOSTOMY (Neck) APPLICATION OF WOUND VAC/WOUND VAC EXCHANGE-Matrix Myriad (Left)  Patient Location: ICU  Anesthesia Type:General  Level of Consciousness: Patient remains intubated per anesthesia plan  Airway & Oxygen Therapy: Patient placed on Ventilator (see vital sign flow sheet for setting)  Post-op Assessment: Report given to RN and Post -op Vital signs reviewed and stable  Post vital signs: Reviewed and stable  Last Vitals:  Vitals Value Taken Time  BP    Temp    Pulse    Resp    SpO2      Last Pain:  Vitals:   05/17/21 0400  TempSrc: Esophageal  PainSc: 0-No pain         Complications: No notable events documented.

## 2021-05-17 NOTE — Consult Note (Signed)
PHARMACY CONSULT NOTE - FOLLOW UP  Pharmacy Consult for Electrolyte Monitoring and Replacement   Recent Labs: Potassium (mmol/L)  Date Value  05/17/2021 5.1  08/11/2013 3.3 (L)   Magnesium (mg/dL)  Date Value  05/17/2021 2.4   Calcium (mg/dL)  Date Value  05/17/2021 7.8 (L)   Calcium, Total (mg/dL)  Date Value  08/11/2013 9.2   Albumin (g/dL)  Date Value  05/17/2021 2.2 (L)  08/11/2013 3.1 (L)   Phosphorus (mg/dL)  Date Value  05/17/2021 4.6   Sodium (mmol/L)  Date Value  05/17/2021 133 (L)  08/11/2013 138   Corr Ca= 9.2  Assessment: 71 yo female with a previous medical history of diabetes mellitus type 2, hypertension, obstructive sleep apnea on CPAP, asthma/COPD, GERD, history of COVID-19 pneumonia presenting with lower left extremity pain and chills. Patient found to have severe sepsis and strep pyogenes bacteremia on admission and is currently intubated and sedated. Pharmacy has been consulted to monitor and replace electrolytes.  Nutrition:  --Feeds per tube --Free water per tube 30 mL Q4H  Goal of Therapy:  Electrolytes within normal limits  Plan:  --No replacement required at this time  --Follow-up with am labs  Alexandra Foster A 05/17/2021 4:29 PM

## 2021-05-17 NOTE — Progress Notes (Signed)
[facility]  ROUNDING NOTE   Subjective:  Patient remains on CRRT. Currently 8 L positive for the admission. Due for tracheostomy placement today.  Objective:  Vital signs in last 24 hours:  Temp:  [95.2 F (35.1 C)-100.4 F (38 C)] 99.1 F (37.3 C) (02/03 0830) Pulse Rate:  [70-95] 87 (02/03 0830) Resp:  [13-32] 16 (02/03 0830) BP: (93-135)/(45-90) 117/56 (02/03 0830) SpO2:  [98 %-100 %] 99 % (02/03 0830) FiO2 (%):  [30 %-40 %] 40 % (02/03 0810) Weight:  [124 kg] 124 kg (02/03 0445)  Weight change: 0 kg Filed Weights   05/15/21 0452 05/16/21 0500 05/17/21 0445  Weight: 128.4 kg 124 kg 124 kg    Intake/Output: I/O last 3 completed shifts: In: 3020.7 [I.V.:1284.4; Other:5.3; NG/GT:1585; IV Piggyback:146] Out: 6133 [Urine:10; Drains:475; MWNUU:7253; Stool:250]   Intake/Output this shift:  Total I/O In: 81.7 [I.V.:81.7] Out: 280 [Other:280]  Physical Exam: General: Critically ill-appearing  Head: Normocephalic, atraumatic.  Endotracheal tube in place  Eyes: Anicteric  Neck: Supple  Lungs:  Scattered rhonchi bilateral, normal effort  Heart: S1S2 no rubs  Abdomen:  Soft, nontender, bowel sounds present  Extremities: Left lower extremity wound VAC in place.  1+ right lower extremity edema  Neurologic: Intubated  Skin: No acute rash  Access: Left IJ temporary dialysis catheter    Basic Metabolic Panel: Recent Labs  Lab 05/13/21 0311 05/13/21 1650 05/14/21 0410 05/14/21 1555 05/15/21 0313 05/15/21 1708 05/16/21 0422 05/16/21 1646 05/17/21 0350  NA 131*   < > 133*   < > 131* 132* 134* 133* 135  K 4.0   < > 3.6   < > 3.8 3.3* 3.1* 3.4* 4.5  CL 98   < > 98   < > 98 99 101 100 102  CO2 26   < > 26   < > 24 21* 22 20* 21*  GLUCOSE 159*   < > 193*   < > 228* 241* 156* 124* 103*  BUN 36*   < > 33*   < > 48* 51* 45* 43* 44*  CREATININE 1.77*   < > 1.45*   < > 1.66* 1.43* 1.32* 1.20* 1.38*  CALCIUM 8.1*   < > 9.1   < > 9.2 9.3 9.6 8.7* 8.3*  MG 1.8   --  1.9  --  1.9  --  1.8  --  2.4  PHOS 3.4   < > 2.3*   < > 2.3* 2.6 2.4* 2.9 3.6   < > = values in this interval not displayed.     Liver Function Tests: Recent Labs  Lab 05/11/21 0531 05/11/21 1600 05/12/21 0431 05/12/21 1025 05/12/21 1603 05/13/21 0311 05/13/21 1650 05/15/21 0313 05/15/21 1708 05/16/21 0422 05/16/21 1646 05/17/21 0350  AST 4,359*  --  6,519* 6,250*  --  4,114*  --  639*  --   --   --   --   ALT 1,746*  --  1,613* 1,711*  --  1,400*  --  749*  --   --   --   --   ALKPHOS 185*  --  232* 262*  --  266*  --  328*  --   --   --   --   BILITOT 2.5*  --  3.0* 3.9*  --  4.0*  --  5.0*  --   --   --   --   PROT 4.6*  --  5.3* 6.3*  --  5.9*  --  6.6  --   --   --   --   ALBUMIN <1.5*   < > 2.1*   2.0* 2.5*   < > 2.4*   2.4*   < > 2.6*   2.6* 2.4* 2.2* 2.1* 1.9*   < > = values in this interval not displayed.    No results for input(s): LIPASE, AMYLASE in the last 168 hours. No results for input(s): AMMONIA in the last 168 hours.  CBC: Recent Labs  Lab 05/11/21 0531 05/11/21 1239 05/13/21 0311 05/13/21 1650 05/14/21 0410 05/14/21 1056 05/15/21 0313 05/16/21 0422 05/17/21 0350  WBC 42.8*   < > 34.8*  --  28.4*  --  24.5* 12.6* 11.4*  NEUTROABS 34.4*  --   --   --   --   --  22.7*  --   --   HGB 10.8*   < > 7.3*   < > 6.7* 8.0* 8.7* 9.5* 9.2*  HCT 33.2*   < > 22.2*   < > 20.5* 23.5* 26.5* 28.6* 28.0*  MCV 88.3   < > 87.7  --  86.5  --  88.0 85.9 87.5  PLT 219   < > 141*  --  121*  --  122* 144* 156   < > = values in this interval not displayed.     Cardiac Enzymes: Recent Labs  Lab 05/12/21 1603  CKTOTAL 4,670*     BNP: Invalid input(s): POCBNP  CBG: Recent Labs  Lab 05/16/21 1601 05/16/21 1907 05/16/21 2316 05/17/21 0433 05/17/21 0729  GLUCAP 143* 115* 115* 107* 106*     Microbiology: Results for orders placed or performed during the hospital encounter of 05/04/21  Blood Culture (routine x 2)     Status: Abnormal   Collection  Time: 05/04/21  7:43 PM   Specimen: BLOOD  Result Value Ref Range Status   Specimen Description   Final    BLOOD RIGHT ANTECUBITAL Performed at [facility], [address] [place], [place] [address]    Special Requests   Final    BOTTLES DRAWN AEROBIC AND ANAEROBIC Blood Culture adequate volume Performed at [facility], [address] [place], [place] [address]    Culture  Setup Time   Final    GRAM POSITIVE COCCI IN BOTH AEROBIC AND ANAEROBIC BOTTLES RESULT CALLED TO, READ BACK BY AND VERIFIED WITH: Delight Stare 05/05/21 @ 1007 BY SB    Culture (A)  Final    GROUP A STREP (S.PYOGENES) ISOLATED HEALTH DEPARTMENT NOTIFIED Performed at [facility], [address], [place], [place] [address]    Report Status 05/07/2021 FINAL  Final   Organism ID, Bacteria GROUP A STREP (S.PYOGENES) ISOLATED  Final      Susceptibility   Group a strep (s.pyogenes) isolated - MIC*    PENICILLIN <=0.06 SENSITIVE Sensitive     CEFTRIAXONE <=0.12 SENSITIVE Sensitive     ERYTHROMYCIN <=0.12 SENSITIVE Sensitive     LEVOFLOXACIN 1 SENSITIVE Sensitive     VANCOMYCIN 0.5 SENSITIVE Sensitive     * GROUP A STREP (S.PYOGENES) ISOLATED  Blood Culture ID Panel (Reflexed)     Status: Abnormal   Collection Time: 05/04/21  7:43 PM  Result Value Ref Range Status   Enterococcus faecalis NOT DETECTED NOT DETECTED Final   Enterococcus Faecium NOT DETECTED NOT DETECTED Final   Listeria monocytogenes NOT DETECTED NOT DETECTED Final   Staphylococcus species NOT DETECTED NOT DETECTED Final   Staphylococcus aureus (  BCID) NOT DETECTED NOT DETECTED Final   Staphylococcus epidermidis NOT DETECTED NOT DETECTED Final   Staphylococcus lugdunensis NOT DETECTED NOT DETECTED Final   Streptococcus species DETECTED (A) NOT DETECTED Final    Comment: RESULT CALLED TO, READ BACK BY AND VERIFIED WITH: Delight Stare 05/05/21 @ 1007 BY SB    Streptococcus agalactiae NOT DETECTED NOT DETECTED Final    Streptococcus pneumoniae NOT DETECTED NOT DETECTED Final   Streptococcus pyogenes DETECTED (A) NOT DETECTED Final    Comment: RESULT CALLED TO, READ BACK BY AND VERIFIED WITH: Delight Stare 05/05/21 @ 1007 BY SB    A.calcoaceticus-baumannii NOT DETECTED NOT DETECTED Final   Bacteroides fragilis NOT DETECTED NOT DETECTED Final   Enterobacterales NOT DETECTED NOT DETECTED Final   Enterobacter cloacae complex NOT DETECTED NOT DETECTED Final   Escherichia coli NOT DETECTED NOT DETECTED Final   Klebsiella aerogenes NOT DETECTED NOT DETECTED Final   Klebsiella oxytoca NOT DETECTED NOT DETECTED Final   Klebsiella pneumoniae NOT DETECTED NOT DETECTED Final   Proteus species NOT DETECTED NOT DETECTED Final   Salmonella species NOT DETECTED NOT DETECTED Final   Serratia marcescens NOT DETECTED NOT DETECTED Final   Haemophilus influenzae NOT DETECTED NOT DETECTED Final   Neisseria meningitidis NOT DETECTED NOT DETECTED Final   Pseudomonas aeruginosa NOT DETECTED NOT DETECTED Final   Stenotrophomonas maltophilia NOT DETECTED NOT DETECTED Final   Candida albicans NOT DETECTED NOT DETECTED Final   Candida auris NOT DETECTED NOT DETECTED Final   Candida glabrata NOT DETECTED NOT DETECTED Final   Candida krusei NOT DETECTED NOT DETECTED Final   Candida parapsilosis NOT DETECTED NOT DETECTED Final   Candida tropicalis NOT DETECTED NOT DETECTED Final   Cryptococcus neoformans/gattii NOT DETECTED NOT DETECTED Final    Comment: Performed at [facility], [address] [place], [place] [address]  Blood Culture (routine x 2)     Status: Abnormal   Collection Time: 05/04/21  9:29 PM   Specimen: BLOOD  Result Value Ref Range Status   Specimen Description   Final    BLOOD RIGHT ANTECUBITAL Performed at [facility], [address] [place], [place] [address]    Special Requests   Final    BOTTLES DRAWN AEROBIC AND ANAEROBIC Blood Culture adequate volume Performed at [facility], [address] [place], [place] [address]    Culture  Setup Time   Final    GRAM POSITIVE COCCI IN BOTH AEROBIC AND ANAEROBIC BOTTLES CRITICAL VALUE NOTED.  VALUE IS CONSISTENT WITH PREVIOUSLY REPORTED AND CALLED VALUE. Performed at [facility], [address], [place], [place] [address]    Culture (A)  Final    GROUP A STREP (S.PYOGENES) ISOLATED HEALTH DEPARTMENT NOTIFIED SUSCEPTIBILITIES PERFORMED ON PREVIOUS CULTURE WITHIN THE LAST 5 DAYS. STAPHYLOCOCCUS EPIDERMIDIS THE SIGNIFICANCE OF ISOLATING THIS ORGANISM FROM A SINGLE SET OF BLOOD CULTURES WHEN MULTIPLE SETS ARE DRAWN IS UNCERTAIN. PLEASE NOTIFY THE MICROBIOLOGY DEPARTMENT WITHIN ONE WEEK IF SPECIATION AND SENSITIVITIES ARE REQUIRED. Performed at [facility], [address] [place], [place] [address]    Report Status 05/08/2021 FINAL  Final  Resp Panel by RT-PCR (Flu A&B, Covid) Nasopharyngeal Swab     Status: None   Collection Time: 05/04/21  9:50 PM   Specimen: Nasopharyngeal Swab; Nasopharyngeal(NP) swabs in vial transport medium  Result Value Ref Range Status   SARS Coronavirus 2 by RT PCR NEGATIVE NEGATIVE Final    Comment: (NOTE) SARS-CoV-2 target nucleic acids are NOT  DETECTED.  The SARS-CoV-2 RNA is generally detectable in upper respiratory specimens during the acute phase of infection. The lowest concentration of SARS-CoV-2 viral copies this assay can detect is 138 copies/mL. A negative result does not preclude SARS-Cov-2 infection and should not be used as the sole basis for treatment or other patient management decisions. A negative result may occur with  improper specimen collection/handling, submission of specimen other than nasopharyngeal swab, presence of viral mutation(s) within the areas targeted by this assay, and inadequate number of viral copies(<138 copies/mL). A negative result must be combined with clinical observations, patient history, and  epidemiological information. The expected result is Negative.  Fact Sheet for Patients:  BloggerCourse.com  Fact Sheet for Healthcare Providers:  SeriousBroker.it  This test is no t yet approved or cleared by the [place] FDA and  has been authorized for detection and/or diagnosis of SARS-CoV-2 by FDA under an Emergency Use Authorization (EUA). This EUA will remain  in effect (meaning this test can be used) for the duration of the COVID-19 declaration under Section 564(b)(1) of the Act, 21 U.S.C.section 360bbb-3(b)(1), unless the authorization is terminated  or revoked sooner.       Influenza A by PCR NEGATIVE NEGATIVE Final   Influenza B by PCR NEGATIVE NEGATIVE Final    Comment: (NOTE) The Xpert Xpress SARS-CoV-2/FLU/RSV plus assay is intended as an aid in the diagnosis of influenza from Nasopharyngeal swab specimens and should not be used as a sole basis for treatment. Nasal washings and aspirates are unacceptable for Xpert Xpress SARS-CoV-2/FLU/RSV testing.  Fact Sheet for Patients: BloggerCourse.com  Fact Sheet for Healthcare Providers: SeriousBroker.it  This test is not yet approved or cleared by the [place] FDA and has been authorized for detection and/or diagnosis of SARS-CoV-2 by FDA under an Emergency Use Authorization (EUA). This EUA will remain in effect (meaning this test can be used) for the duration of the COVID-19 declaration under Section 564(b)(1) of the Act, 21 U.S.C. section 360bbb-3(b)(1), unless the authorization is terminated or revoked.  Performed at [facility], [address] [place], [place] [address]   MRSA Next Gen by PCR, Nasal     Status: None   Collection Time: 05/05/21 11:02 AM   Specimen: Nasal Mucosa; Nasal Swab  Result Value Ref Range Status   MRSA by PCR Next Gen NOT DETECTED NOT DETECTED Final    Comment:  (NOTE) The GeneXpert MRSA Assay (FDA approved for NASAL specimens only), is one component of a comprehensive MRSA colonization surveillance program. It is not intended to diagnose MRSA infection nor to guide or monitor treatment for MRSA infections. Test performance is not FDA approved in patients less than 42 years old. Performed at [facility], [address], [place], [place] [address]   Urine Culture     Status: None   Collection Time: 05/07/21  2:30 PM   Specimen: Urine, Random  Result Value Ref Range Status   Specimen Description   Final    URINE, RANDOM Performed at [facility], [address] [place], [place] [address]    Special Requests   Final    NONE Performed at [facility], [address], [place], [place] [address]    Culture   Final    NO GROWTH Performed at [facility], [address] [place], [place] [address]    Report Status 05/09/2021 FINAL  Final  Aerobic/Anaerobic Culture w Gram Stain (surgical/deep wound)     Status: None  Collection Time: 05/07/21  9:35 PM   Specimen: PATH Other; Tissue  Result Value Ref Range Status   Specimen Description   Final    LEG LEFT Performed at [facility], [address] [place], [place] [address]    Special Requests PT PREVIOUSLY ON CLINDAMYCIN,PENICILLIN  Final   Gram Stain   Final    RARE WBC PRESENT,BOTH PMN AND MONONUCLEAR NO ORGANISMS SEEN    Culture   Final    No growth aerobically or anaerobically. Performed at [facility], [address] [place], [place] [address]    Report Status 05/13/2021 FINAL  Final  CULTURE, BLOOD (ROUTINE X 2) w Reflex to ID Panel     Status: None   Collection Time: 05/09/21  7:13 AM   Specimen: BLOOD  Result Value Ref Range Status   Specimen Description BLOOD BLOOD LEFT HAND  Final   Special Requests   Final    BOTTLES DRAWN AEROBIC AND ANAEROBIC Blood Culture adequate volume   Culture   Final    NO GROWTH 5  DAYS Performed at [facility], [address] [place], [place] [address]    Report Status 05/14/2021 FINAL  Final  CULTURE, BLOOD (ROUTINE X 2) w Reflex to ID Panel     Status: None   Collection Time: 05/09/21  7:23 AM   Specimen: BLOOD  Result Value Ref Range Status   Specimen Description BLOOD LEFT THUMB  Final   Special Requests   Final    BOTTLES DRAWN AEROBIC AND ANAEROBIC Blood Culture results may not be optimal due to an inadequate volume of blood received in culture bottles   Culture   Final    NO GROWTH 5 DAYS Performed at [facility], [address], [place], [place] [address]    Report Status 05/14/2021 FINAL  Final    Coagulation Studies: Recent Labs    05/14/21 1056 05/16/21 0422 05/17/21 0350  LABPROT 21.0* 15.7* 15.8*  INR 1.8* 1.3* 1.3*     Urinalysis: No results for input(s): COLORURINE, LABSPEC, PHURINE, GLUCOSEU, HGBUR, BILIRUBINUR, KETONESUR, PROTEINUR, UROBILINOGEN, NITRITE, LEUKOCYTESUR in the last 72 hours.  Invalid input(s): APPERANCEUR    Imaging: ECHOCARDIOGRAM COMPLETE  Result Date: 05/16/2021    ECHOCARDIOGRAM REPORT   Patient Name:   [facility] Date of Exam: 05/16/2021 Medical Rec #:  [id number]           Height:       65.0 in Accession #:    [id number]          Weight:       273.4 lb Date of Birth:  18-Apr-1950           BSA:          2.259 m Patient Age:    70 years            BP:           115/50 mmHg Patient Gender: F                   HR:           75 bpm. Exam Location:  [facility] Procedure: 2D Echo, Color Doppler and Cardiac Doppler Indications:     R94.31 Abnormal ECG  History:         Patient has prior history of Echocardiogram examinations, most                  recent 05/06/2021. COPD;  Risk Factors:Diabetes and Hypertension.  Sonographer:     Humphrey Rolls Referring Phys:  [id number] Erin Fulling Diagnosing Phys: Julien Nordmann MD  Sonographer Comments: Echo performed with patient supine and on artificial respirator and  Technically difficult study due to poor echo windows. Image acquisition challenging due to patient body habitus and Image acquisition challenging due to COPD. IMPRESSIONS  1. Left ventricular ejection fraction, by estimation, is 60 to 65%. The left ventricle has normal function. The left ventricle has no regional wall motion abnormalities. There is mild left ventricular hypertrophy. Left ventricular diastolic parameters are consistent with Grade I diastolic dysfunction (impaired relaxation).  2. Right ventricular systolic function is normal. The right ventricular size is normal. There is mildly elevated pulmonary artery systolic pressure. The estimated right ventricular systolic pressure is 44.9 mmHg.  3. The mitral valve is normal in structure. No evidence of mitral valve regurgitation. No evidence of mitral stenosis.  4. The aortic valve is normal in structure. Aortic valve regurgitation is not visualized. No aortic stenosis is present.  5. The inferior vena cava is normal in size with greater than 50% respiratory variability, suggesting right atrial pressure of 3 mmHg. FINDINGS  Left Ventricle: Left ventricular ejection fraction, by estimation, is 60 to 65%. The left ventricle has normal function. The left ventricle has no regional wall motion abnormalities. The left ventricular internal cavity size was normal in size. There is  mild left ventricular hypertrophy. Left ventricular diastolic parameters are consistent with Grade I diastolic dysfunction (impaired relaxation). Right Ventricle: The right ventricular size is normal. No increase in right ventricular wall thickness. Right ventricular systolic function is normal. There is mildly elevated pulmonary artery systolic pressure. The tricuspid regurgitant velocity is 3.16  m/s, and with an assumed right atrial pressure of 5 mmHg, the estimated right ventricular systolic pressure is 44.9 mmHg. Left Atrium: Left atrial size was normal in size. Right Atrium: Right  atrial size was normal in size. Pericardium: There is no evidence of pericardial effusion. Mitral Valve: The mitral valve is normal in structure. No evidence of mitral valve regurgitation. No evidence of mitral valve stenosis. MV peak gradient, 3.7 mmHg. The mean mitral valve gradient is 2.0 mmHg. Tricuspid Valve: The tricuspid valve is normal in structure. Tricuspid valve regurgitation is mild . No evidence of tricuspid stenosis. Aortic Valve: The aortic valve is normal in structure. Aortic valve regurgitation is not visualized. No aortic stenosis is present. Aortic valve mean gradient measures 6.0 mmHg. Aortic valve peak gradient measures 9.9 mmHg. Aortic valve area, by VTI measures 1.95 cm. Pulmonic Valve: The pulmonic valve was normal in structure. Pulmonic valve regurgitation is mild. No evidence of pulmonic stenosis. Aorta: The aortic root is normal in size and structure. Venous: The inferior vena cava is normal in size with greater than 50% respiratory variability, suggesting right atrial pressure of 3 mmHg. IAS/Shunts: No atrial level shunt detected by color flow Doppler.  LEFT VENTRICLE PLAX 2D LVIDd:         4.57 cm   Diastology LVIDs:         3.74 cm   LV e' medial:    5.66 cm/s LV PW:         1.01 cm   LV E/e' medial:  10.6 LV IVS:        0.73 cm   LV e' lateral:   4.13 cm/s LVOT diam:     1.80 cm   LV E/e' lateral: 14.6 LV SV:  47 LV SV Index:   21 LVOT Area:     2.54 cm  LEFT ATRIUM           Index LA diam:      3.20 cm 1.42 cm/m LA Vol (A4C): 42.8 ml 18.95 ml/m  AORTIC VALVE                     PULMONIC VALVE AV Area (Vmax):    2.07 cm      PV Vmax:          1.32 m/s AV Area (Vmean):   1.83 cm      PV Vmean:         97.400 cm/s AV Area (VTI):     1.95 cm      PV VTI:           0.208 m AV Vmax:           157.00 cm/s   PV Peak grad:     7.0 mmHg AV Vmean:          108.000 cm/s  PV Mean grad:     4.0 mmHg AV VTI:            0.240 m       PR End Diast Vel: 6.66 msec AV Peak Grad:      9.9  mmHg AV Mean Grad:      6.0 mmHg LVOT Vmax:         128.00 cm/s LVOT Vmean:        77.800 cm/s LVOT VTI:          0.184 m LVOT/AV VTI ratio: 0.77  AORTA Ao Root diam: 2.60 cm MITRAL VALVE               TRICUSPID VALVE MV Area (PHT): 3.27 cm    TR Peak grad:   39.9 mmHg MV Area VTI:   1.81 cm    TR Vmax:        316.00 cm/s MV Peak grad:  3.7 mmHg MV Mean grad:  2.0 mmHg    SHUNTS MV Vmax:       0.96 m/s    Systemic VTI:  0.18 m MV Vmean:      56.7 cm/s   Systemic Diam: 1.80 cm MV Decel Time: 232 msec MV E velocity: 60.10 cm/s MV A velocity: 67.90 cm/s MV E/A ratio:  0.89 Julien Nordmann MD Electronically signed by Julien Nordmann MD Signature Date/Time: 05/16/2021/3:38:58 PM    Final      Medications:     prismasol BGK 4/2.5 500 mL/hr at 05/17/21 0459    prismasol BGK 4/2.5 500 mL/hr at 05/17/21 0459   sodium chloride Stopped (05/13/21 2154)   amiodarone 30 mg/hr (05/17/21 0900)   cefTRIAXone (ROCEPHIN)  IV Stopped (05/16/21 1700)   feeding supplement (VITAL AF 1.2 CAL) Stopped (05/17/21 0000)   HYDROmorphone 0.5 mg/hr (05/17/21 0900)   phenylephrine (NEO-SYNEPHRINE) Adult infusion     prismasol BGK 4/2.5 2,000 mL/hr at 05/17/21 0738    arformoterol  15 mcg Nebulization BID   vitamin C  500 mg Per Tube BID   budesonide (PULMICORT) nebulizer solution  0.25 mg Nebulization BID   chlorhexidine gluconate (MEDLINE KIT)  15 mL Mouth Rinse BID   Chlorhexidine Gluconate Cloth  6 each Topical Q0600   docusate  100 mg Per Tube BID   feeding supplement (PROSource TF)  45 mL Per Tube TID   free water  30  mL Per Tube Q4H   insulin aspart  1-3 Units Subcutaneous Q4H   insulin detemir  38 Units Subcutaneous BID   levETIRAcetam  1,000 mg Per Tube BID   linezolid  600 mg Per Tube Q12H   magic mouthwash  10 mL Oral QID   mouth rinse  15 mL Mouth Rinse 10 times per day   midodrine  10 mg Per Tube TID WC   multivitamin  1 tablet Per Tube QHS   nutrition supplement (JUVEN)  1 packet Per Tube BID BM    pantoprazole (PROTONIX) IV  40 mg Intravenous Q24H   polyethylene glycol  17 g Per Tube Daily   revefenacin  175 mcg Nebulization Daily   sodium chloride flush  10-40 mL Intracatheter Q12H   zinc sulfate  220 mg Per Tube Daily   artificial tears, docusate sodium, fentaNYL (SUBLIMAZE) injection, heparin, HYDROmorphone, lip balm, midazolam, ondansetron (ZOFRAN) IV, oxyCODONE, sennosides, sodium chloride, sodium chloride flush  Assessment/ Plan:  71 y.o. female with past medical history of diabetes mellitus type 2, hypertension, obstructive sleep apnea on CPAP, GERD, history of COVID-19 pneumonia who came in with left lower extremity pain and chills and subsequently found to have streptococcal pyogenes bacteremia and cellulitis status post surgical debridement with subsequent wound VAC placement.  1.  Acute kidney injury secondary to ATN from septic shock. 02/02 0701 - 02/03 0700 In: 1814.2 [I.V.:783.3; NG/GT:885; IV Piggyback:146] Out: 4024 [Urine:10; Drains:400; Stool:250] Lab Results  Component Value Date   CREATININE 1.38 (H) 05/17/2021   CREATININE 1.20 (H) 05/16/2021   CREATININE 1.32 (H) 05/16/2021  02/02 0701 - 02/03 0700 In: 1814.2 [I.V.:783.3; NG/GT:885; IV Piggyback:146] Out: 4024 [Urine:10; Drains:400; Stool:250]  Urine output only 10 cc over the preceding 24 hours.  Patient continues to tolerate CRRT.  She is now net +8 L from the admission which is decreasing.  Continue ultrafiltration at 100 cc/h as we have made some progress over the course of this week.  2.  Acute respiratory failure.  Tracheostomy to be placed today.  3.  Hypotension.  Pressor support as per pulmonary/critical care.  4.  Left lower extremity deep infection status post surgical debridement and wound VAC placement/Streptococcus pyogenes infection.  Patient transitioned to ceftriaxone.  5.  Hyponatremia.  Sodium now normalized to 135.    LOS: 13 Alexandra Foster 2/3/20239:30 AM

## 2021-05-17 NOTE — Progress Notes (Signed)
CRRT stopped, patient will be going down for trach and left leg procedure. Dr. Holley Raring aware. Continue to monitor.

## 2021-05-17 NOTE — Op Note (Signed)
05/17/2021  1:44 PM    Alexandra Foster  [id number]   Pre-Op Diagnosis:  Respiratory failure, complex Left leg wound  Post-op Diagnosis: Respiratory failure, complex Left leg wound  Procedure: Elective Tracheostomy  Surgeon:  [name]  Assistant: Carloyn Manner  Anesthesia:  General endotracheal anesthesia  EBL:  Minimal  Complications:  None  Findings: None  Procedure: The patient was taken to the Operating Room from the CCU, already intubated, and placed in the supine position.  After induction of general anesthesia, the patient was placed on a shoulder roll with the neck extended. The skin was injected along the proposed incision line over the trachea with 1% lidocaine with epinephrine, 1:100,000. The area was then prepped and draped in the usual sterile fashion.  A 15 blade was then used to incise the skin in a horizontal incision over the trachea. The dissection was carried down to the subcutaneous tissues and through the platysma with the Bovie. Anterior jugular veins were divided with the harmonic scalpel for hemostasis. The strap muscles were divided in the midline and retracted laterally. The thyroid isthmus was exposed and divided in the midline over the trachea and dissected away from the anterior aspect of the trachea with the Bovie. With hemostasis obtained, the anesthesiologist was alerted that the airway was about to be entered so that the oxygen concentration could be lowered to reduce fire risk. The scrub tech prepared the tracheostomy tube, confirming no leak in the balloon cuff. The trachea was then incised between the 2nd and third tracheal rings, and an inferiorly based tracheal flap created by cutting through the third tracheal ring laterally. This was sutured up to the skin with a 4-0 Vicryl suture to help create a tracheocutaneous tract. The airway was suctioned and, after the anesthesiologist pulled the endotracheal tube back,  a #8 Shiley tracheostomy  tube was inserted into the tracheal lumen. The inner cannula was placed, the cuff inflated,  and the patient hooked to the anesthesia circuit for ventilation. CO2 return and adequate ventilation was confirmed with the anesthesiologist. Hemostasis was confirmed and the flange of the tracheostomy tube was sutured to the skin with 3-0 silk suture. A trach tie was placed around the neck to further secure the tracheostomy tube. Betadine soaked gauze was placed around the wound. A [place] Sump NG tube was placed and position confirmed by auscultation of the stomach.  The patient was then returned to the anesthesiologist and the General Surgery team for additional procedures.  Disposition:   Return to the CCU  Plan: Routine trach care and suctioning each shift and PRN. Vent settings per CCU admitting physician. Sutures can be removed in a week.  [name] 05/17/2021 1:44 PM

## 2021-05-18 DIAGNOSIS — I4892 Unspecified atrial flutter: Secondary | ICD-10-CM | POA: Diagnosis not present

## 2021-05-18 DIAGNOSIS — R6521 Severe sepsis with septic shock: Secondary | ICD-10-CM | POA: Diagnosis not present

## 2021-05-18 DIAGNOSIS — N179 Acute kidney failure, unspecified: Secondary | ICD-10-CM | POA: Diagnosis not present

## 2021-05-18 DIAGNOSIS — A400 Sepsis due to streptococcus, group A: Secondary | ICD-10-CM | POA: Diagnosis not present

## 2021-05-18 DIAGNOSIS — I4891 Unspecified atrial fibrillation: Secondary | ICD-10-CM | POA: Diagnosis not present

## 2021-05-18 DIAGNOSIS — A419 Sepsis, unspecified organism: Secondary | ICD-10-CM | POA: Diagnosis not present

## 2021-05-18 DIAGNOSIS — G9340 Encephalopathy, unspecified: Secondary | ICD-10-CM | POA: Diagnosis not present

## 2021-05-18 DIAGNOSIS — I6200 Nontraumatic subdural hemorrhage, unspecified: Secondary | ICD-10-CM

## 2021-05-18 DIAGNOSIS — Z930 Tracheostomy status: Secondary | ICD-10-CM

## 2021-05-18 DIAGNOSIS — J9690 Respiratory failure, unspecified, unspecified whether with hypoxia or hypercapnia: Secondary | ICD-10-CM

## 2021-05-18 LAB — RENAL FUNCTION PANEL
Albumin: 2.6 g/dL — ABNORMAL LOW (ref 3.5–5.0)
Albumin: 2.7 g/dL — ABNORMAL LOW (ref 3.5–5.0)
Anion gap: 11 (ref 5–15)
Anion gap: 10 (ref 5–15)
BUN: 38 mg/dL — ABNORMAL HIGH (ref 8–23)
BUN: 48 mg/dL — ABNORMAL HIGH (ref 8–23)
CO2: 23 mmol/L (ref 22–32)
CO2: 21 mmol/L — ABNORMAL LOW (ref 22–32)
Calcium: 8.3 mg/dL — ABNORMAL LOW (ref 8.9–10.3)
Calcium: 8.8 mg/dL — ABNORMAL LOW (ref 8.9–10.3)
Chloride: 100 mmol/L (ref 98–111)
Chloride: 104 mmol/L (ref 98–111)
Creatinine, Ser: 1.12 mg/dL — ABNORMAL HIGH (ref 0.44–1.00)
Creatinine, Ser: 1.12 mg/dL — ABNORMAL HIGH (ref 0.44–1.00)
GFR, Estimated: 52 mL/min — ABNORMAL LOW (ref >60)
GFR, Estimated: 52 mL/min — ABNORMAL LOW (ref >60)
Glucose, Bld: 197 mg/dL — ABNORMAL HIGH (ref 70–99)
Glucose, Bld: 231 mg/dL — ABNORMAL HIGH (ref 70–99)
Phosphorus: 3.4 mg/dL (ref 2.5–4.6)
Phosphorus: 2 mg/dL — ABNORMAL LOW (ref 2.5–4.6)
Potassium: 4.4 mmol/L (ref 3.5–5.1)
Potassium: 3.5 mmol/L (ref 3.5–5.1)
Sodium: 134 mmol/L — ABNORMAL LOW (ref 135–145)
Sodium: 135 mmol/L (ref 135–145)

## 2021-05-18 LAB — GLUCOSE, CAPILLARY
Glucose-Capillary: 163 mg/dL — ABNORMAL HIGH (ref 70–99)
Glucose-Capillary: 168 mg/dL — ABNORMAL HIGH (ref 70–99)
Glucose-Capillary: 178 mg/dL — ABNORMAL HIGH (ref 70–99)
Glucose-Capillary: 179 mg/dL — ABNORMAL HIGH (ref 70–99)
Glucose-Capillary: 182 mg/dL — ABNORMAL HIGH (ref 70–99)
Glucose-Capillary: 184 mg/dL — ABNORMAL HIGH (ref 70–99)
Glucose-Capillary: 196 mg/dL — ABNORMAL HIGH (ref 70–99)
Glucose-Capillary: 205 mg/dL — ABNORMAL HIGH (ref 70–99)
Glucose-Capillary: 225 mg/dL — ABNORMAL HIGH (ref 70–99)
Glucose-Capillary: 258 mg/dL — ABNORMAL HIGH (ref 70–99)
Glucose-Capillary: 261 mg/dL — ABNORMAL HIGH (ref 70–99)
Glucose-Capillary: 268 mg/dL — ABNORMAL HIGH (ref 70–99)

## 2021-05-18 LAB — MAGNESIUM: Magnesium: 2.4 mg/dL (ref 1.7–2.4)

## 2021-05-18 LAB — CBC
HCT: 25.3 % — ABNORMAL LOW (ref 36.0–46.0)
Hemoglobin: 8.3 g/dL — ABNORMAL LOW (ref 12.0–15.0)
MCH: 28.6 pg (ref 26.0–34.0)
MCHC: 32.8 g/dL (ref 30.0–36.0)
MCV: 87.2 fL (ref 80.0–100.0)
Platelets: 152 10*3/uL (ref 150–400)
RBC: 2.9 MIL/uL — ABNORMAL LOW (ref 3.87–5.11)
RDW: 20.7 % — ABNORMAL HIGH (ref 11.5–15.5)
WBC: 14.6 10*3/uL — ABNORMAL HIGH (ref 4.0–10.5)
nRBC: 41.1 % — ABNORMAL HIGH (ref 0.0–0.2)

## 2021-05-18 LAB — ALBUMIN: Albumin: 2.4 g/dL — ABNORMAL LOW (ref 3.5–5.0)

## 2021-05-18 MED ORDER — PHENYLEPHRINE CONCENTRATED 100MG/250ML (0.4 MG/ML) INFUSION SIMPLE
0.0000 ug/min | INTRAVENOUS | Status: DC
  Filled 2021-05-18 (×2): qty 250

## 2021-05-18 MED ORDER — DEXTROSE 50 % IV SOLN: 0.0000 mL | INTRAVENOUS | Status: DC | PRN

## 2021-05-18 MED ORDER — PHENYLEPHRINE CONCENTRATED 100MG/250ML (0.4 MG/ML) INFUSION SIMPLE: 0.0000 ug/min | INTRAVENOUS | Status: DC

## 2021-05-18 MED ORDER — INSULIN DETEMIR 100 UNIT/ML ~~LOC~~ SOLN
29.0000 [IU] | Freq: Two times a day (BID) | SUBCUTANEOUS | Status: DC
  Administered 2021-05-18 – 2021-05-19 (×2): 29 [IU] via SUBCUTANEOUS
  Filled 2021-05-18 (×4): qty 0.29

## 2021-05-18 MED ORDER — INSULIN ASPART 100 UNIT/ML IJ SOLN
3.0000 [IU] | INTRAMUSCULAR | Status: DC
  Administered 2021-05-19 (×2): 9 [IU] via SUBCUTANEOUS
  Filled 2021-05-18 (×2): qty 1

## 2021-05-18 MED ORDER — INSULIN ASPART 100 UNIT/ML IJ SOLN
10.0000 [IU] | INTRAMUSCULAR | Status: DC
  Administered 2021-05-19 (×3): 10 [IU] via SUBCUTANEOUS
  Filled 2021-05-18 (×3): qty 1

## 2021-05-18 MED ORDER — DEXTROSE 10 % IV SOLN: INTRAVENOUS | Status: DC | PRN

## 2021-05-18 MED ORDER — SODIUM CHLORIDE 0.9 % IV SOLN
0.0000 ug/min | INTRAVENOUS | Status: DC
  Administered 2021-05-18: 220 ug/min via INTRAVENOUS
  Filled 2021-05-18 (×2): qty 10

## 2021-05-18 MED ORDER — POTASSIUM PHOSPHATES 15 MMOLE/5ML IV SOLN
15.0000 mmol | Freq: Once | INTRAVENOUS | Status: AC
  Administered 2021-05-18: 15 mmol via INTRAVENOUS
  Filled 2021-05-18: qty 5

## 2021-05-18 MED ORDER — INSULIN REGULAR(HUMAN) IN NACL 100-0.9 UT/100ML-% IV SOLN
INTRAVENOUS | Status: DC
  Administered 2021-05-18: 14 [IU]/h via INTRAVENOUS
  Filled 2021-05-18: qty 100

## 2021-05-18 NOTE — Progress Notes (Signed)
Progress Note  Patient Name: Alexandra Foster Date of Encounter: 05/18/2021  [facility] Cardiologist: Kathlyn Sacramento, MD   Subjective   No acute events overnight, s/p trach.  Maintaining sinus rhythm on amiodarone drip.  Needing pressors, antibiotics.  Inpatient Medications    Scheduled Meds:  arformoterol  15 mcg Nebulization BID   vitamin C  500 mg Per Tube BID   budesonide (PULMICORT) nebulizer solution  0.25 mg Nebulization BID   chlorhexidine gluconate (MEDLINE KIT)  15 mL Mouth Rinse BID   Chlorhexidine Gluconate Cloth  6 each Topical Q0600   docusate  100 mg Per Tube BID   feeding supplement (PROSource TF)  45 mL Per Tube TID   free water  30 mL Per Tube Q4H   hydrocortisone sod succinate (SOLU-CORTEF) inj  100 mg Intravenous Q8H   insulin aspart  1-3 Units Subcutaneous Q4H   insulin detemir  38 Units Subcutaneous BID   levETIRAcetam  1,000 mg Per Tube BID   linezolid  600 mg Per Tube Q12H   magic mouthwash w/lidocaine  10 mL Oral QID   mouth rinse  15 mL Mouth Rinse 10 times per day   midodrine  10 mg Per Tube TID WC   multivitamin  1 tablet Per Tube QHS   nutrition supplement (JUVEN)  1 packet Per Tube BID BM   pantoprazole (PROTONIX) IV  40 mg Intravenous Q24H   polyethylene glycol  17 g Per Tube Daily   revefenacin  175 mcg Nebulization Daily   sodium chloride flush  10-40 mL Intracatheter Q12H   zinc sulfate  220 mg Per Tube Daily   Continuous Infusions:   prismasol BGK 4/2.5 500 mL/hr at 05/18/21 0546    prismasol BGK 4/2.5 500 mL/hr at 05/18/21 0546   sodium chloride Stopped (05/13/21 2154)   albumin human 25 g (05/18/21 0948)   amiodarone 30 mg/hr (05/18/21 0700)   cefTRIAXone (ROCEPHIN)  IV Stopped (05/17/21 1638)   feeding supplement (VITAL AF 1.2 CAL) 1,000 mL (05/17/21 2210)   HYDROmorphone Stopped (05/18/21 0730)   phenylephrine (NEO-SYNEPHRINE) Adult infusion 100 mcg/min (05/18/21 1021)   phenylephrine (NEO-SYNEPHRINE) Adult infusion      prismasol BGK 4/2.5 2,000 mL/hr at 05/18/21 0546   vasopressin 0.03 Units/min (05/18/21 0700)   PRN Meds: artificial tears, docusate sodium, fentaNYL (SUBLIMAZE) injection, heparin, HYDROmorphone, lip balm, midazolam, ondansetron (ZOFRAN) IV, oxyCODONE, sennosides, sodium chloride, sodium chloride flush   Vital Signs    Vitals:   05/18/21 1000 05/18/21 1100 05/18/21 1111 05/18/21 1200  BP: 136/62 (!) 117/59  (!) 129/59  Pulse: 69 69 72 70  Resp: '20 15 14 14  ' Temp: 99.1 F (37.3 C) 99.1 F (37.3 C) 99 F (37.2 C) 99 F (37.2 C)  TempSrc:      SpO2: 100% 100% 100% 100%  Weight:      Height:        Intake/Output Summary (Last 24 hours) at 05/18/2021 1248 Last data filed at 05/18/2021 1200 Gross per 24 hour  Intake 2655.68 ml  Output 5441 ml  Net -2785.32 ml   Last 3 Weights 05/18/2021 05/17/2021 05/16/2021  Weight (lbs) 261 lb 7.5 oz 273 lb 5.9 oz 273 lb 5.9 oz  Weight (kg) 118.6 kg 124 kg 124 kg      Telemetry    Sinus rhythm- Personally Reviewed  ECG     - Personally Reviewed  Physical Exam   GEN: Somnolent, does not follow commands Neck: S/p trach Cardiac: RRR,  no murmurs, Respiratory: Vented breath sounds GI: Soft, nontender, distended  MS: Left lower extremity wound VAC, extensive debridement. Neuro: Unable to assess Psych: Unable to assess  Labs    High Sensitivity Troponin:   Recent Labs  Lab 05/15/21 1708 05/15/21 1926  TROPONINIHS 338* 305*     Chemistry Recent Labs  Lab 05/13/21 0311 05/13/21 1650 05/15/21 0313 05/15/21 1708 05/16/21 0422 05/16/21 1646 05/17/21 0350 05/17/21 1604 05/18/21 0305  NA 131*   < > 131*   < > 134*   < > 135 133* 134*  K 4.0   < > 3.8   < > 3.1*   < > 4.5 5.1 4.4  CL 98   < > 98   < > 101   < > 102 103 100  CO2 26   < > 24   < > 22   < > 21* 20* 23  GLUCOSE 159*   < > 228*   < > 156*   < > 103* 159* 197*  BUN 36*   < > 48*   < > 45*   < > 44* 52* 38*  CREATININE 1.77*   < > 1.66*   < > 1.32*   < > 1.38* 1.58*  1.12*  CALCIUM 8.1*   < > 9.2   < > 9.6   < > 8.3* 7.8* 8.3*  MG 1.8   < > 1.9  --  1.8  --  2.4  --  2.4  PROT 5.9*  --  6.6  --   --   --  6.2*  --   --   ALBUMIN 2.4*   2.4*   < > 2.6*   2.6*   < > 2.2*   < > 1.9*   1.9* 2.2* 2.6*   2.4*  AST 4,114*  --  639*  --   --   --  163*  --   --   ALT 1,400*  --  749*  --   --   --  326*  --   --   ALKPHOS 266*  --  328*  --   --   --  452*  --   --   BILITOT 4.0*  --  5.0*  --   --   --  3.6*  --   --   GFRNONAA 30*   < > 33*   < > 43*   < > 41* 35* 52*  ANIONGAP 7   < > 9   < > 11   < > '12 10 11   ' < > = values in this interval not displayed.    Lipids  Recent Labs  Lab 05/15/21 0313  TRIG 121    Hematology Recent Labs  Lab 05/16/21 0422 05/17/21 0350 05/18/21 0305  WBC 12.6* 11.4* 14.6*  RBC 3.33* 3.20* 2.90*  HGB 9.5* 9.2* 8.3*  HCT 28.6* 28.0* 25.3*  MCV 85.9 87.5 87.2  MCH 28.5 28.8 28.6  MCHC 33.2 32.9 32.8  RDW 19.9* 20.3* 20.7*  PLT 144* 156 152   Thyroid  Recent Labs  Lab 05/13/21 0311 05/14/21 0410  TSH 0.196*  --   FREET4  --  0.48*    BNPNo results for input(s): BNP, PROBNP in the last 168 hours.  DDimer No results for input(s): DDIMER in the last 168 hours.   Radiology    DG Abd 1 View  Result Date: 05/17/2021 CLINICAL DATA:  Nasogastric tube placement EXAM: ABDOMEN -  1 VIEW COMPARISON:  05/15/2021 FINDINGS: Nasogastric tube tip is seen within the epigastric region overlying the expected mid body of the stomach. Small retained contrast likely within the gastric fundus. Central venous catheter tip noted within the superior right atrium. Retrocardiac atelectasis or infiltrate noted. The abdominal gas pattern is indeterminate due to a paucity of intra-abdominal gas. IMPRESSION: Nasogastric tube tip within the expected mid body of the stomach. Electronically Signed   By: Fidela Salisbury M.D.   On: 05/17/2021 19:37    Cardiac Studies   TTE 05/2021 1. Left ventricular ejection fraction, by estimation, is 60 to  65%. The  left ventricle has normal function. The left ventricle has no regional  wall motion abnormalities. There is mild left ventricular hypertrophy.  Left ventricular diastolic parameters  are consistent with Grade I diastolic dysfunction (impaired relaxation).   2. Right ventricular systolic function is normal. The right ventricular  size is normal. There is mildly elevated pulmonary artery systolic  pressure. The estimated right ventricular systolic pressure is 26.3 mmHg.   3. The mitral valve is normal in structure. No evidence of mitral valve  regurgitation. No evidence of mitral stenosis.   4. The aortic valve is normal in structure. Aortic valve regurgitation is  not visualized. No aortic stenosis is present.   5. The inferior vena cava is normal in size with greater than 50%  respiratory variability, suggesting right atrial pressure of 3 mmHg.   Patient Profile     71 y.o. female with history of hypertension, diabetes, obesity, COPD admitted with lower extremity cellulitis, bacteremia and shock.  Hospital course complicated by respiratory failure s/p trach, significant infection of the left lower extremity s/p extensive debridement.  Being seen for A. fib RVR.  Assessment & Plan    A. fib RVR -Maintaining sinus rhythm -Continue amiodarone drip -Transition to p.o. when able to tolerate p.o. -Not on heparin due to anemia, recent surgery -Echo with preserved EF  2.  Cellulitis, bacteremia, s/p debridement, wound VAC -Antibiotics as per ICU/ID teams  3.  Respiratory failure s/p trach -Management as per respiratory/ICU team  Total encounter time more than 50 minutes  Greater than 50% was spent in counseling and coordination of care with ICU team, RN.     Signed, Kate Sable, MD  05/18/2021, 12:48 PM

## 2021-05-18 NOTE — Consult Note (Signed)
PHARMACY CONSULT NOTE - FOLLOW UP  Pharmacy Consult for Electrolyte Monitoring and Replacement   Recent Labs: Potassium (mmol/L)  Date Value  05/18/2021 4.4  08/11/2013 3.3 (L)   Magnesium (mg/dL)  Date Value  05/18/2021 2.4   Calcium (mg/dL)  Date Value  05/18/2021 8.3 (L)   Calcium, Total (mg/dL)  Date Value  08/11/2013 9.2   Albumin (g/dL)  Date Value  05/18/2021 2.6 (L)  05/18/2021 2.4 (L)  08/11/2013 3.1 (L)   Phosphorus (mg/dL)  Date Value  05/18/2021 3.4   Sodium (mmol/L)  Date Value  05/18/2021 134 (L)  08/11/2013 138   Corr Ca= 9.2  Assessment: 71 yo female with a previous medical history of diabetes mellitus type 2, hypertension, obstructive sleep apnea on CPAP, asthma/COPD, GERD, history of COVID-19 pneumonia presenting with lower left extremity pain and chills. Patient found to have severe sepsis and strep pyogenes bacteremia on admission and is currently intubated and sedated. LLL necrotizing fascitis. [facility]  Pharmacy has been consulted to monitor and replace electrolytes.  -on CRRT -s/p trach 2/3, still on vent  Nutrition:  --Feeds per tube --Free water per tube 30 mL Q4H  Goal of Therapy:  Electrolytes within normal limits  Plan:  --No replacement at this time  --Follow-up with am labs  Sherilee Smotherman A 05/18/2021 11:36 AM

## 2021-05-18 NOTE — Plan of Care (Signed)
Patient remains on CRRT; tolerating fluid removal. Remains on Vent, however, tolerated several hours on PSV. Remains on pressors; weaning as tolerated. Remains on insulin drip per Endotool. Following commands; no movements noted in LUE or BLE.  Problem: Education: Goal: Knowledge of General Education information will improve Description: Including pain rating scale, medication(s)/side effects and non-pharmacologic comfort measures Outcome: Progressing   Problem: Health Behavior/Discharge Planning: Goal: Ability to manage health-related needs will improve Outcome: Progressing   Problem: Clinical Measurements: Goal: Ability to maintain clinical measurements within normal limits will improve Outcome: Progressing Goal: Will remain free from infection Outcome: Progressing Goal: Diagnostic test results will improve Outcome: Progressing   Problem: Pain Managment: Goal: General experience of comfort will improve Outcome: Progressing   Problem: Safety: Goal: Ability to remain free from injury will improve Outcome: Progressing   Problem: Skin Integrity: Goal: Risk for impaired skin integrity will decrease Outcome: Progressing   Problem: Activity: Goal: Ability to tolerate increased activity will improve Outcome: Progressing   Problem: Respiratory: Goal: Ability to maintain a clear airway and adequate ventilation will improve Outcome: Progressing   Problem: Role Relationship: Goal: Method of communication will improve Outcome: Progressing   Problem: Education: Goal: Knowledge of disease and its progression will improve Outcome: Progressing   Problem: Clinical Measurements: Goal: Complications related to the disease process or treatment will be avoided or minimized Outcome: Progressing Goal: Dialysis access will remain free of complications Outcome: Progressing   Problem: Fluid Volume: Goal: Fluid volume balance will be maintained or improved Outcome: Progressing    Problem: Urinary Elimination: Goal: Progression of disease will be identified and treated Outcome: Progressing   Problem: Education: Goal: Knowledge of disease or condition will improve Outcome: Progressing Goal: Knowledge of secondary prevention will improve (SELECT ALL) Outcome: Progressing Goal: Knowledge of patient specific risk factors will improve (INDIVIDUALIZE FOR PATIENT) Outcome: Progressing Goal: Individualized Educational Video(s) Outcome: Progressing   Problem: Coping: Goal: Will verbalize positive feelings about self Outcome: Progressing Goal: Will identify appropriate support needs Outcome: Progressing   Problem: Health Behavior/Discharge Planning: Goal: Ability to manage health-related needs will improve Outcome: Progressing   Problem: Self-Care: Goal: Ability to participate in self-care as condition permits will improve Outcome: Progressing Goal: Verbalization of feelings and concerns over difficulty with self-care will improve Outcome: Progressing Goal: Ability to communicate needs accurately will improve Outcome: Progressing   Problem: Nutrition: Goal: Risk of aspiration will decrease Outcome: Progressing Goal: Dietary intake will improve Outcome: Progressing   Problem: Spontaneous Subarachnoid Hemorrhage Tissue Perfusion: Goal: Complications of Spontaneous Subarachnoid Hemorrhage will be minimized Outcome: Progressing

## 2021-05-18 NOTE — Progress Notes (Signed)
NAME:  Alexandra Foster, MRN:  [id number], DOB:  1950/08/15, LOS: 23 ADMISSION DATE:  05/04/2021  BRIEF SYNOPSIS 71 y.o female with medical history significant of Asthma/COPD, T2DM, HTN, OSA on CPAP, GERD, COVID-19 pneumonia, and Bilateral lower extremity edema who presented to the ED with chief complaint of LLE pain and chills since Thursday.  Patient was given 30 cc/kg of fluids and started on broad-spectrum antibiotics for sepsis with septic shock.   Admitted to the ICU with severe sepsis with shock secondary to cellulitis of LLE septic shock with LLL necrotizing fascitis post op resp failure and severe septic shock with progressive renal failure on CRRT and progressive multiorgan failure  MRI/CT SHOWS SMALL SAH  Past Medical History     Arthritis   Asthma   COPD (chronic obstructive pulmonary disease) ([place])   Diabetes mellitus without complication ([place])   Endometriosis   GERD (gastroesophageal reflux disease)   Hypertension   Obesity   Sleep apnea    CPAP    Significant Hospital Events   05/04/21: Admitted to the ICU with severe sepsis with shock secondary to cellulitis of LLE 1/23 off pressors +Afib with RVR 1/24 blood pressure low  1/24 to OR for fasciotomy 1/25 post op resp failure with severe septic shock 1/26 remains on pressors 1/27 returns to the OR today for debridement, wound VAC 1/28 overnight with hemodynamic deterioration, required reintubation, currently on pressors 1/29 remains on 3 pressors, CRRT, wound VAC and minimal sedation.  Awakens to voice and touch.  Seen by vascular surgery and awaiting input from general surgery regarding amputation of left leg 1/30 remains on vent remains on pressors 1/31 remains on pressors, on vent, on CRRT 2/1 remains on pressors, on CRRT, MRI/CT shows SAH, afib with RVR, ECHO pending, restarted AMIO at 33 2/2 remains on vent plan for trach tomorrow 2/3 remains on crrt, remains on vent, plan for [facility] and wound vac change in  OR 2/3 S/P TRACH,  Implantation of cellular matrix on left leg 2/4 REMAINS ON PRESSORS, REMAINS ON VENT, REMAINS ON CRRT  Significant Diagnostic Tests:  1/21: Chest Xray> no active cardiopulmonary process 1/21: CT left lower leg>Diffuse subcutaneous edema may represent cellulitis. No drainable fluid collection/abscess 1/21: Ultrasound lower unilateral left> no DVT   Micro Data:  1/21: SARS-CoV-2 PCR> negative 1/21: Influenza PCR> negative 1/21: Blood culture x2> Streptococcus pyogenes 1/21: Urine Culture> negative 1/21: MRSA PCR>> negative   Antimicrobials:  Vancomycin 1/21> off Cefepime 1/21> off Ceftriaxone 1/21 >> off Penicilli G 1/24>> Linezolid 1/25>>  *Antimicrobials per ID    Interim History / Subjective:  Patient with acute necrotizing fasciitis with emergent fasciotomy Implantation of cellular matrix on left leg REMAINS ON pressors On CRRT REMAINS CRITICALLY ILL REMAINS ON VENT S/P TRACH WEAN SEDATION AND ASSESS NEURO STATUS TODAY   Vent Mode: PSV;CPAP FiO2 (%):  [30 %-40 %] 30 % Set Rate:  [20 bmp] 20 bmp Vt Set:  [460 mL] 460 mL PEEP:  [5 cmH20] 5 cmH20 Pressure Support:  [10 cmH20] 10 cmH20 Plateau Pressure:  [11 cmH20-16 cmH20] 11 cmH20     Objective   Blood pressure (!) 120/54, pulse 62, temperature 97.9 F (36.6 C), temperature source Oral, resp. rate 15, height '5\' 5"'  (1.651 m), weight 118.6 kg, SpO2 100 %.    Vent Mode: PSV;CPAP FiO2 (%):  [30 %-40 %] 30 % Set Rate:  [20 bmp] 20 bmp Vt Set:  [460 mL] 460 mL PEEP:  [5 cmH20] 5 cmH20 Pressure Support:  [  [place] Pressure:  [11 cmH20-16 cmH20] 11 cmH20   Intake/Output Summary (Last 24 hours) at 05/18/2021 0748 Last data filed at 05/18/2021 0700 Gross per 24 hour  Intake 2793.98 ml  Output 4918 ml  Net -2124.02 ml    Filed Weights   05/16/21 0500 05/17/21 0445 05/18/21 0413  Weight: 124 kg 124 kg 118.6 kg     REVIEW OF SYSTEMS  PATIENT IS UNABLE TO PROVIDE COMPLETE  REVIEW OF SYSTEMS DUE TO SEVERE CRITICAL ILLNESS AND TOXIC METABOLIC ENCEPHALOPATHY    PHYSICAL EXAMINATION:  GENERAL:critically ill appearing, +resp distress EYES: Pupils equal, round, reactive to light.  No scleral icterus.  MOUTH: Moist mucosal membrane. INTUBATED NECK: Supple.  PULMONARY: +rhonchi, +wheezing CARDIOVASCULAR: S1 and S2.  No murmurs  GASTROINTESTINAL: Soft, nontender, -distended. Positive bowel sounds.  MUSCULOSKELETAL: No swelling, clubbing, or edema.  NEUROLOGIC: obtunded MUSCULOSKELETAL: Left lower extremity with wound VAC in place, surgery redressing.  Status post fasciotomy   Assessment & Plan   Admitted to the ICU with severe sepsis with shock secondary to cellulitis of LLE septic shock with LLL necrotizing fascitis post op resp failure and severe septic shock with progressive renal failure on CRRT and progressive multiorgan failure  Severe ACUTE Hypoxic and Hypercapnic Respiratory Failure -continue Mechanical Ventilator support -Wean Fio2 and PEEP as tolerated -VAP/VENT bundle implementation - Wean PEEP & FiO2 as tolerated, maintain SpO2 > 88% - Head of bed elevated 30 degrees, VAP protocol in place - Plateau pressures less than 30 cm H20  - Intermittent chest x-ray & ABG PRN - Ensure adequate pulmonary hygiene  -will perform SAT/SBT when respiratory parameters are met S/P TRACH   SEPTIC shock SOURCE-LLE Acute Hypoxic Respiratory Failure in the setting of worsening septic shock PMHx: COPD Refractory Septic Shock secondary to LLE necrotizing fasciitis Streptococcal toxic shock syndrome Weaning PRESSORS AS TOLERATED STRESS DOSE STEROIDS use vasopressors to keep MAP>65 as needed S/p IVIG    INFECTIOUS DISEASE -continue antibiotics as prescribed -follow up ID consultation Group A strep necrotizing fasciitis L LE Streptococcal toxic shock - Continue antibiotics: Penicillin G/linezolid - Status postdebridement, wound VAC per surgery - Poor  prognosis -Vascular and general surgery following S/p IVIG    ACUTE KIDNEY INJURY/Renal Failure -continue Foley Catheter-assess need -Avoid nephrotoxic agents -Follow urine output, BMP -Ensure adequate renal perfusion, optimize oxygenation -Renal dose medications CONTINUE CRRT  Intake/Output Summary (Last 24 hours) at 05/18/2021 0754 Last data filed at 05/18/2021 0700 Gross per 24 hour  Intake 2793.98 ml  Output 4918 ml  Net -2124.02 ml    Acute encephalopathy  Toxic metabolic encephalopathy Loaded with keppra; continue at 1gram bid MRI/CT HEAD SHOWS SMALL VOLUME SAH APPRECIATE NEUROSURGERY RECS -Neurology consulted NO ANTICOAGULATION FOR  1 MONTH AND THEN NEED TO RE-ASSESS NO ANTIPLATELET AGENTS FOR 1 MONTH AND THEN NEED TO RE-ASSESS CAN POSSIBLY CONSIDER BABY ASA BUT WOULD NEED TO PROCEED WITH CAUTION CAN CONSIDER  DVT PROPHYLAXIS AFTER SURGERY BASED ON RISK OF BLEEDING   ENDO - ICU hypoglycemic\Hyperglycemia protocol -check FSBS per protocol   GI GI PROPHYLAXIS as indicated  NUTRITIONAL STATUS DIET-->TF's as tolerated Constipation protocol as indicated   ELECTROLYTES -follow labs as needed -replace as needed -pharmacy consultation and following    Best practice:  Diet: NPO Pain/Anxiety/Delirium protocol (if indicated): Yes, initiated VAP protocol (if indicated): Yes, initiated DVT prophylaxis: Subcutaneous Heparin GI prophylaxis: PPI Glucose control: ICU hyperglycemia protocol, insulin drip discontinued; SQ insulin Central venous access:  Rt IJV Arterial line:  N/A  Foley:  Yes Mobility:  bed rest  PT consulted: N/A Last date of multidisciplinary goals of care discussion [1/27] Code Status:  full code Disposition: ICU    Labs   CBC: Recent Labs  Lab 05/14/21 0410 05/14/21 1056 05/15/21 0313 05/16/21 0422 05/17/21 0350 05/18/21 0305  WBC 28.4*  --  24.5* 12.6* 11.4* 14.6*  NEUTROABS  --   --  22.7*  --   --   --   HGB 6.7* 8.0* 8.7* 9.5*  9.2* 8.3*  HCT 20.5* 23.5* 26.5* 28.6* 28.0* 25.3*  MCV 86.5  --  88.0 85.9 87.5 87.2  PLT 121*  --  122* 144* 156 152     Basic Metabolic Panel: Recent Labs  Lab 05/14/21 0410 05/14/21 1555 05/15/21 0313 05/15/21 1708 05/16/21 0422 05/16/21 1646 05/17/21 0350 05/17/21 1604 05/18/21 0305  NA 133*   < > 131*   < > 134* 133* 135 133* 134*  K 3.6   < > 3.8   < > 3.1* 3.4* 4.5 5.1 4.4  CL 98   < > 98   < > 101 100 102 103 100  CO2 26   < > 24   < > 22 20* 21* 20* 23  GLUCOSE 193*   < > 228*   < > 156* 124* 103* 159* 197*  BUN 33*   < > 48*   < > 45* 43* 44* 52* 38*  CREATININE 1.45*   < > 1.66*   < > 1.32* 1.20* 1.38* 1.58* 1.12*  CALCIUM 9.1   < > 9.2   < > 9.6 8.7* 8.3* 7.8* 8.3*  MG 1.9  --  1.9  --  1.8  --  2.4  --  2.4  PHOS 2.3*   < > 2.3*   < > 2.4* 2.9 3.6 4.6 3.4   < > = values in this interval not displayed.    GFR: Estimated Creatinine Clearance: 58.5 mL/min (A) (by C-G formula based on SCr of 1.12 mg/dL (H)). Recent Labs  Lab 05/13/21 0311 05/14/21 0410 05/15/21 0313 05/16/21 0422 05/17/21 0350 05/18/21 0305  PROCALCITON 20.94 21.12  --   --   --   --   WBC 34.8* 28.4* 24.5* 12.6* 11.4* 14.6*     Liver Function Tests: Recent Labs  Lab 05/12/21 0431 05/12/21 1025 05/12/21 1603 05/13/21 0311 05/13/21 1650 05/15/21 0313 05/15/21 1708 05/16/21 0422 05/16/21 1646 05/17/21 0350 05/17/21 1604 05/18/21 0305  AST 6,519* 6,250*  --  4,114*  --  639*  --   --   --  163*  --   --   ALT 1,613* 1,711*  --  1,400*  --  749*  --   --   --  326*  --   --   ALKPHOS 232* 262*  --  266*  --  328*  --   --   --  452*  --   --   BILITOT 3.0* 3.9*  --  4.0*  --  5.0*  --   --   --  3.6*  --   --   PROT 5.3* 6.3*  --  5.9*  --  6.6  --   --   --  6.2*  --   --   ALBUMIN 2.1*   2.0* 2.5*   < > 2.4*   2.4*   < > 2.6*   2.6*   < > 2.2* 2.1* 1.9*   1.9* 2.2* 2.6*   2.4*   < > =  values in this interval not displayed.    Coagulation Profile: Recent Labs  Lab  05/14/21 1056 05/16/21 0422 05/17/21 0350  INR 1.8* 1.3* 1.3*     Cardiac Enzymes: Recent Labs  Lab 05/12/21 1603  CKTOTAL 4,670*     HbA1C: Hgb A1c MFr Bld  Date/Time Value Ref Range Status  05/05/2021 04:53 AM 6.7 (H) 4.8 - 5.6 % Final    Comment:    (NOTE)         Prediabetes: 5.7 - 6.4         Diabetes: >6.4         Glycemic control for adults with diabetes: <7.0   02/11/2021 09:41 AM 6.3 (H) <5.7 % of total Hgb Final    Comment:    For someone without known diabetes, a hemoglobin  A1c value between 5.7% and 6.4% is consistent with prediabetes and should be confirmed with a  follow-up test. . For someone with known diabetes, a value <7% indicates that their diabetes is well controlled. A1c targets should be individualized based on duration of diabetes, age, comorbid conditions, and other considerations. . This assay result is consistent with an increased risk of diabetes. . Currently, no consensus exists regarding use of hemoglobin A1c for diagnosis of diabetes for children. .     CBG: Recent Labs  Lab 05/17/21 1111 05/17/21 1514 05/17/21 1934 05/17/21 2310 05/18/21 0733  GLUCAP 109* 142* 138* 156* 184*    No Known Allergies   Medications  Scheduled Meds:  arformoterol  15 mcg Nebulization BID   vitamin C  500 mg Per Tube BID   budesonide (PULMICORT) nebulizer solution  0.25 mg Nebulization BID   chlorhexidine gluconate (MEDLINE KIT)  15 mL Mouth Rinse BID   Chlorhexidine Gluconate Cloth  6 each Topical Q0600   docusate  100 mg Per Tube BID   feeding supplement (PROSource TF)  45 mL Per Tube TID   free water  30 mL Per Tube Q4H   hydrocortisone sod succinate (SOLU-CORTEF) inj  100 mg Intravenous Q8H   insulin aspart  1-3 Units Subcutaneous Q4H   insulin detemir  38 Units Subcutaneous BID   levETIRAcetam  1,000 mg Per Tube BID   linezolid  600 mg Per Tube Q12H   magic mouthwash w/lidocaine  10 mL Oral QID   mouth rinse  15 mL Mouth Rinse  10 times per day   midodrine  10 mg Per Tube TID WC   multivitamin  1 tablet Per Tube QHS   nutrition supplement (JUVEN)  1 packet Per Tube BID BM   pantoprazole (PROTONIX) IV  40 mg Intravenous Q24H   polyethylene glycol  17 g Per Tube Daily   revefenacin  175 mcg Nebulization Daily   sodium chloride flush  10-40 mL Intracatheter Q12H   zinc sulfate  220 mg Per Tube Daily   Continuous Infusions:   prismasol BGK 4/2.5 500 mL/hr at 05/18/21 0546    prismasol BGK 4/2.5 500 mL/hr at 05/18/21 0546   sodium chloride Stopped (05/13/21 2154)   albumin human Stopped (05/17/21 2235)   amiodarone 30 mg/hr (05/18/21 0700)   cefTRIAXone (ROCEPHIN)  IV Stopped (05/17/21 1638)   feeding supplement (VITAL AF 1.2 CAL) 1,000 mL (05/17/21 2210)   HYDROmorphone 0.5 mg/hr (05/18/21 0700)   phenylephrine (NEO-SYNEPHRINE) Adult infusion 220 mcg/min (05/18/21 0700)   phenylephrine (NEO-SYNEPHRINE) Adult infusion     prismasol BGK 4/2.5 2,000 mL/hr at 05/18/21 0546   vasopressin 0.03 Units/min (05/18/21 0700)  PRN Meds:.artificial tears, docusate sodium, fentaNYL (SUBLIMAZE) injection, heparin, HYDROmorphone, lip balm, midazolam, ondansetron (ZOFRAN) IV, oxyCODONE, sennosides, sodium chloride, sodium chloride flush       Critical Care Time devoted to patient care services described in this note is 55 minutes.  Critical care was necessary to treat /prevent imminent and life-threatening deterioration. Overall, patient is critically ill, prognosis is guarded.  Patient with Multiorgan failure and at high risk for cardiac arrest and death.    Corrin Parker, M.D.  [facility]  Medical Director [place] Director [facility] Cardio[facility] Department

## 2021-05-18 NOTE — Progress Notes (Signed)
[facility]  PROGRESS NOTE   Subjective:   Patient remains on CRRT.  Presently tolerating well with 100 cc fluid removal an hour. She had tracheostomy done yesterday.  Objective:  Vital signs in last 24 hours:  Temp:  [97.5 F (36.4 C)-99.9 F (37.7 C)] 99.1 F (37.3 C) (02/04 1000) Pulse Rate:  [52-80] 69 (02/04 1000) Resp:  [12-26] 20 (02/04 1000) BP: (108-136)/(40-64) 136/62 (02/04 1000) SpO2:  [99 %-100 %] 100 % (02/04 1111) FiO2 (%):  [30 %] 30 % (02/04 1111) Weight:  [118.6 kg] 118.6 kg (02/04 0413)  Weight change: -5.4 kg Filed Weights   05/16/21 0500 05/17/21 0445 05/18/21 0413  Weight: 124 kg 124 kg 118.6 kg    Intake/Output: I/O last 3 completed shifts: In: 3647 [I.V.:2149; NG/GT:1313.8; IV Piggyback:184.2] Out: 2800 [Urine:25; Drains:275; LKJZP:9150; Stool:425]   Intake/Output this shift:  Total I/O In: 65 [NG/GT:65] Out: 888 [Other:888]  Physical Exam: General:  No acute distress  Head:  Normocephalic, atraumatic. Moist oral mucosal membranes  Eyes:  Anicteric  Neck:  Supple  Lungs:   Clear to auscultation, normal effort  Heart:  S1S2 no rubs  Abdomen:   Soft, nontender, bowel sounds present  Extremities:  peripheral edema.  Neurologic: Off of sedation but is still lethargic.  Skin:  No lesions  Access:     Basic Metabolic Panel: Recent Labs  Lab 05/14/21 0410 05/14/21 1555 05/15/21 0313 05/15/21 1708 05/16/21 0422 05/16/21 1646 05/17/21 0350 05/17/21 1604 05/18/21 0305  NA 133*   < > 131*   < > 134* 133* 135 133* 134*  K 3.6   < > 3.8   < > 3.1* 3.4* 4.5 5.1 4.4  CL 98   < > 98   < > 101 100 102 103 100  CO2 26   < > 24   < > 22 20* 21* 20* 23  GLUCOSE 193*   < > 228*   < > 156* 124* 103* 159* 197*  BUN 33*   < > 48*   < > 45* 43* 44* 52* 38*  CREATININE 1.45*   < > 1.66*   < > 1.32* 1.20* 1.38* 1.58* 1.12*  CALCIUM 9.1   < > 9.2   < > 9.6 8.7* 8.3* 7.8* 8.3*  MG 1.9  --  1.9  --  1.8  --  2.4  --  2.4  PHOS 2.3*   < >  2.3*   < > 2.4* 2.9 3.6 4.6 3.4   < > = values in this interval not displayed.    CBC: Recent Labs  Lab 05/14/21 0410 05/14/21 1056 05/15/21 0313 05/16/21 0422 05/17/21 0350 05/18/21 0305  WBC 28.4*  --  24.5* 12.6* 11.4* 14.6*  NEUTROABS  --   --  22.7*  --   --   --   HGB 6.7* 8.0* 8.7* 9.5* 9.2* 8.3*  HCT 20.5* 23.5* 26.5* 28.6* 28.0* 25.3*  MCV 86.5  --  88.0 85.9 87.5 87.2  PLT 121*  --  122* 144* 156 152     Urinalysis: No results for input(s): COLORURINE, LABSPEC, PHURINE, GLUCOSEU, HGBUR, BILIRUBINUR, KETONESUR, PROTEINUR, UROBILINOGEN, NITRITE, LEUKOCYTESUR in the last 72 hours.  Invalid input(s): APPERANCEUR    Imaging: DG Abd 1 View  Result Date: 05/17/2021 CLINICAL DATA:  Nasogastric tube placement EXAM: ABDOMEN - 1 VIEW COMPARISON:  05/15/2021 FINDINGS: Nasogastric tube tip is seen within the epigastric region overlying the expected mid body of the stomach. Small retained  contrast likely within the gastric fundus. Central venous catheter tip noted within the superior right atrium. Retrocardiac atelectasis or infiltrate noted. The abdominal gas pattern is indeterminate due to a paucity of intra-abdominal gas. IMPRESSION: Nasogastric tube tip within the expected mid body of the stomach. Electronically Signed   By: Fidela Salisbury M.D.   On: 05/17/2021 19:37   ECHOCARDIOGRAM COMPLETE  Result Date: 05/16/2021    ECHOCARDIOGRAM REPORT   Patient Name:   [facility] Date of Exam: 05/16/2021 Medical Rec #:  [id number]           Height:       65.0 in Accession #:    [id number]          Weight:       273.4 lb Date of Birth:  1950-11-02           BSA:          2.259 m Patient Age:    71 years            BP:           115/50 mmHg Patient Gender: F                   HR:           75 bpm. Exam Location:  [facility] Procedure: 2D Echo, Color Doppler and Cardiac Doppler Indications:     R94.31 Abnormal ECG  History:         Patient has prior history of Echocardiogram examinations, most                   recent 05/06/2021. COPD; Risk Factors:Diabetes and Hypertension.  Sonographer:     Charmayne Sheer Referring Phys:  [id number] Flora Lipps Diagnosing Phys: Ida Rogue MD  Sonographer Comments: Echo performed with patient supine and on artificial respirator and Technically difficult study due to poor echo windows. Image acquisition challenging due to patient body habitus and Image acquisition challenging due to COPD. IMPRESSIONS  1. Left ventricular ejection fraction, by estimation, is 60 to 65%. The left ventricle has normal function. The left ventricle has no regional wall motion abnormalities. There is mild left ventricular hypertrophy. Left ventricular diastolic parameters are consistent with Grade I diastolic dysfunction (impaired relaxation).  2. Right ventricular systolic function is normal. The right ventricular size is normal. There is mildly elevated pulmonary artery systolic pressure. The estimated right ventricular systolic pressure is 99.3 mmHg.  3. The mitral valve is normal in structure. No evidence of mitral valve regurgitation. No evidence of mitral stenosis.  4. The aortic valve is normal in structure. Aortic valve regurgitation is not visualized. No aortic stenosis is present.  5. The inferior vena cava is normal in size with greater than 50% respiratory variability, suggesting right atrial pressure of 3 mmHg. FINDINGS  Left Ventricle: Left ventricular ejection fraction, by estimation, is 60 to 65%. The left ventricle has normal function. The left ventricle has no regional wall motion abnormalities. The left ventricular internal cavity size was normal in size. There is  mild left ventricular hypertrophy. Left ventricular diastolic parameters are consistent with Grade I diastolic dysfunction (impaired relaxation). Right Ventricle: The right ventricular size is normal. No increase in right ventricular wall thickness. Right ventricular systolic function is normal. There is mildly elevated  pulmonary artery systolic pressure. The tricuspid regurgitant velocity is 3.16  m/s, and with an assumed right atrial pressure of 5 mmHg, the estimated right ventricular systolic pressure is 44.9  mmHg. Left Atrium: Left atrial size was normal in size. Right Atrium: Right atrial size was normal in size. Pericardium: There is no evidence of pericardial effusion. Mitral Valve: The mitral valve is normal in structure. No evidence of mitral valve regurgitation. No evidence of mitral valve stenosis. MV peak gradient, 3.7 mmHg. The mean mitral valve gradient is 2.0 mmHg. Tricuspid Valve: The tricuspid valve is normal in structure. Tricuspid valve regurgitation is mild . No evidence of tricuspid stenosis. Aortic Valve: The aortic valve is normal in structure. Aortic valve regurgitation is not visualized. No aortic stenosis is present. Aortic valve mean gradient measures 6.0 mmHg. Aortic valve peak gradient measures 9.9 mmHg. Aortic valve area, by VTI measures 1.95 cm. Pulmonic Valve: The pulmonic valve was normal in structure. Pulmonic valve regurgitation is mild. No evidence of pulmonic stenosis. Aorta: The aortic root is normal in size and structure. Venous: The inferior vena cava is normal in size with greater than 50% respiratory variability, suggesting right atrial pressure of 3 mmHg. IAS/Shunts: No atrial level shunt detected by color flow Doppler.  LEFT VENTRICLE PLAX 2D LVIDd:         4.57 cm   Diastology LVIDs:         3.74 cm   LV e' medial:    5.66 cm/s LV PW:         1.01 cm   LV E/e' medial:  10.6 LV IVS:        0.73 cm   LV e' lateral:   4.13 cm/s LVOT diam:     1.80 cm   LV E/e' lateral: 14.6 LV SV:         47 LV SV Index:   21 LVOT Area:     2.54 cm  LEFT ATRIUM           Index LA diam:      3.20 cm 1.42 cm/m LA Vol (A4C): 42.8 ml 18.95 ml/m  AORTIC VALVE                     PULMONIC VALVE AV Area (Vmax):    2.07 cm      PV Vmax:          1.32 m/s AV Area (Vmean):   1.83 cm      PV Vmean:          97.400 cm/s AV Area (VTI):     1.95 cm      PV VTI:           0.208 m AV Vmax:           157.00 cm/s   PV Peak grad:     7.0 mmHg AV Vmean:          108.000 cm/s  PV Mean grad:     4.0 mmHg AV VTI:            0.240 m       PR End Diast Vel: 6.66 msec AV Peak Grad:      9.9 mmHg AV Mean Grad:      6.0 mmHg LVOT Vmax:         128.00 cm/s LVOT Vmean:        77.800 cm/s LVOT VTI:          0.184 m LVOT/AV VTI ratio: 0.77  AORTA Ao Root diam: 2.60 cm MITRAL VALVE               TRICUSPID VALVE MV Area (  PHT): 3.27 cm    TR Peak grad:   39.9 mmHg MV Area VTI:   1.81 cm    TR Vmax:        316.00 cm/s MV Peak grad:  3.7 mmHg MV Mean grad:  2.0 mmHg    SHUNTS MV Vmax:       0.96 m/s    Systemic VTI:  0.18 m MV Vmean:      56.7 cm/s   Systemic Diam: 1.80 cm MV Decel Time: 232 msec MV E velocity: 60.10 cm/s MV A velocity: 67.90 cm/s MV E/A ratio:  0.89 Ida Rogue MD Electronically signed by Ida Rogue MD Signature Date/Time: 05/16/2021/3:38:58 PM    Final      Medications:     prismasol BGK 4/2.5 500 mL/hr at 05/18/21 0546    prismasol BGK 4/2.5 500 mL/hr at 05/18/21 0546   sodium chloride Stopped (05/13/21 2154)   albumin human 25 g (05/18/21 0948)   amiodarone 30 mg/hr (05/18/21 0700)   cefTRIAXone (ROCEPHIN)  IV Stopped (05/17/21 1638)   feeding supplement (VITAL AF 1.2 CAL) 1,000 mL (05/17/21 2210)   HYDROmorphone Stopped (05/18/21 0730)   phenylephrine (NEO-SYNEPHRINE) Adult infusion 100 mcg/min (05/18/21 1021)   phenylephrine (NEO-SYNEPHRINE) Adult infusion     prismasol BGK 4/2.5 2,000 mL/hr at 05/18/21 0546   vasopressin 0.03 Units/min (05/18/21 0700)    arformoterol  15 mcg Nebulization BID   vitamin C  500 mg Per Tube BID   budesonide (PULMICORT) nebulizer solution  0.25 mg Nebulization BID   chlorhexidine gluconate (MEDLINE KIT)  15 mL Mouth Rinse BID   Chlorhexidine Gluconate Cloth  6 each Topical Q0600   docusate  100 mg Per Tube BID   feeding supplement (PROSource TF)  45 mL Per Tube  TID   free water  30 mL Per Tube Q4H   hydrocortisone sod succinate (SOLU-CORTEF) inj  100 mg Intravenous Q8H   insulin aspart  1-3 Units Subcutaneous Q4H   insulin detemir  38 Units Subcutaneous BID   levETIRAcetam  1,000 mg Per Tube BID   linezolid  600 mg Per Tube Q12H   magic mouthwash w/lidocaine  10 mL Oral QID   mouth rinse  15 mL Mouth Rinse 10 times per day   midodrine  10 mg Per Tube TID WC   multivitamin  1 tablet Per Tube QHS   nutrition supplement (JUVEN)  1 packet Per Tube BID BM   pantoprazole (PROTONIX) IV  40 mg Intravenous Q24H   polyethylene glycol  17 g Per Tube Daily   revefenacin  175 mcg Nebulization Daily   sodium chloride flush  10-40 mL Intracatheter Q12H   zinc sulfate  220 mg Per Tube Daily    Assessment/ Plan:     Principal Problem:   Severe sepsis with septic shock (CODE) (HCC) Active Problems:   COPD (chronic obstructive pulmonary disease) (HCC)   Morbid obesity (HCC)   Diabetes mellitus type 2 in obese ([place])   Atrial fibrillation and flutter ([place])   Necrotizing fasciitis ([place])   Acute on chronic respiratory failure ([place])   On mechanically assisted ventilation (HCC)   Shock liver   Anemia associated with acute blood loss   Metabolic acidosis   Encounter for continuous renal replacement therapy (CRRT) for acute renal failure (HCC)   Acute encephalopathy   Group A streptococcal infection   Streptococcal toxic shock syndrome ([place])  71 year old female with history of asthma/COPD, diabetes, hypertension, obstructive sleep apnea, COVID-pneumonia initially admitted  with lower extremity pain.  She was found to be in septic shock and also was found to have cellulitis with complication of necrotizing fasciitis of the left lower extremity.  Patient also developed respiratory failure now on the ventilator with progressive multiorgan failure along with acute kidney injury with hypotension needing CRRT.  #1: Acute kidney injury: AKI is secondary to ischemic  nephropathy secondary to hypotension complicated with sepsis.  Patient is still hypotensive requiring pressors.  We will continue CRRT with 100 mL of fluid removal every hour.  #2: Vent failure: Patient is s/p tracheostomy yesterday.  #3: Hyponatremia: Has improved.  #4: Sepsis: S/p necrotizing fasciitis of the lower extremity.  S/p surgery.  Continue antibiotics as per surgery and ID.  Overall prognosis is guarded.  We will continue to monitor closely. Continue CRRT with the present recommendations.   LOS: [place] 2/4/202312:16 PM

## 2021-05-18 NOTE — Progress Notes (Signed)
ID Pt more awake today Opening eyes spontaneously and to commands Sister is at bedside and she recognized her On 1 pressors _ vasopressin  Amiodarone IV Tracheostomy Rt and left IJ CRRT NG tube Patient Vitals for the past 24 hrs:  BP Temp Temp src Pulse Resp SpO2 Height Weight  05/18/21 1800 126/70 98.1 F (36.7 C) -- 75 16 100 % -- --  05/18/21 1700 (!) 131/52 97.9 F (36.6 C) -- 71 16 100 % -- --  05/18/21 1600 (!) 126/54 97.9 F (36.6 C) -- 78 16 100 % -- --  05/18/21 1528 (!) 127/50 97.9 F (36.6 C) -- 67 19 100 % -- --  05/18/21 1500 -- -- -- 79 18 100 % -- --  05/18/21 1400 (!) 127/58 98.8 F (37.1 C) -- 71 16 100 % -- --  05/18/21 1300 (!) 119/56 98.8 F (37.1 C) -- 66 13 100 % -- --  05/18/21 1200 (!) 129/59 99 F (37.2 C) -- 70 14 100 % -- --  05/18/21 1111 -- 99 F (37.2 C) -- 72 14 100 % -- --  05/18/21 1100 (!) 117/59 99.1 F (37.3 C) -- 69 15 100 % -- --  05/18/21 1000 136/62 99.1 F (37.3 C) -- 69 20 100 % -- --  05/18/21 0900 136/64 99 F (37.2 C) -- 73 12 100 % -- --  05/18/21 0800 (!) 132/58 99 F (37.2 C) Oral 66 12 100 % -- --  05/18/21 0710 -- -- -- 62 15 100 % -- --  05/18/21 0700 (!) 120/54 -- -- (!) 57 17 100 % -- --  05/18/21 0600 (!) 118/52 -- -- (!) 59 16 100 % -- --  05/18/21 0500 (!) 119/55 -- -- (!) 56 (!) 22 100 % -- --  05/18/21 0413 -- -- -- -- -- -- -- 118.6 kg  05/18/21 0400 (!) 125/54 97.9 F (36.6 C) Oral (!) 57 (!) 21 100 % -- --  05/18/21 0300 (!) 116/56 -- -- (!) 55 (!) 23 100 % -- --  05/18/21 0203 -- -- -- -- -- 100 % 5\' 5"  (1.651 m) --  05/18/21 0200 (!) 117/56 -- -- (!) 52 (!) 22 100 % -- --  05/18/21 0100 116/61 (!) 97.5 F (36.4 C) Oral (!) 52 (!) 22 100 % -- --  05/18/21 0000 (!) 123/56 -- -- (!) 52 (!) 24 100 % -- --  05/17/21 2354 -- -- -- (!) 55 (!) 22 100 % -- --  05/17/21 2300 (!) 117/55 -- -- (!) 53 (!) 23 100 % -- --  05/17/21 2200 (!) 108/50 -- -- (!) 56 17 100 % -- --  05/17/21 2130 (!) 128/53 97.6 F (36.4  C) Axillary (!) 57 (!) 21 100 % -- --  05/17/21 2100 (!) 131/53 -- -- (!) 53 18 100 % -- --  05/17/21 2000 (!) 113/40 -- -- 60 (!) 23 100 % -- --  05/17/21 1934 -- -- -- -- -- 100 % 5\' 5"  (1.651 m) --  05/17/21 1900 (!) 114/48 98.3 F (36.8 C) -- 63 (!) 26 100 % -- --  05/17/21 1830 (!) 117/49 -- -- 70 14 100 % -- --    Chest b/l air entry [id number]- sinus rhythm Abd soft Left leg wound vac CNS moves rt arm . Left hand she tried to squeeze my fingers  Labs CBC Latest Ref Rng & Units 05/18/2021 05/17/2021 05/16/2021  WBC 4.0 - 10.5 K/uL 14.6(H) 11.4(H) 12.6(H)  Hemoglobin 12.0 - 15.0 g/dL 8.3(L) 9.2(L) 9.5(L)  Hematocrit 36.0 - 46.0 % 25.3(L) 28.0(L) 28.6(L)  Platelets 150 - 400 K/uL 152 156 144(L)     CMP Latest Ref Rng & Units 05/18/2021 05/18/2021 05/17/2021  Glucose 70 - 99 mg/dL 231(H) 197(H) 159(H)  BUN 8 - 23 mg/dL 48(H) 38(H) 52(H)  Creatinine 0.44 - 1.00 mg/dL 1.12(H) 1.12(H) 1.58(H)  Sodium 135 - 145 mmol/L 135 134(L) 133(L)  Potassium 3.5 - 5.1 mmol/L 3.5 4.4 5.1  Chloride 98 - 111 mmol/L 104 100 103  CO2 22 - 32 mmol/L 21(L) 23 20(L)  Calcium 8.9 - 10.3 mg/dL 8.8(L) 8.3(L) 7.8(L)  Total Protein 6.5 - 8.1 g/dL - - -  Total Bilirubin 0.3 - 1.2 mg/dL - - -  Alkaline Phos 38 - 126 U/L - - -  AST 15 - 41 U/L - - -  ALT 0 - 44 U/L - - -     Micro BC 05/04/21- GAS 05/07/21- Wound culture NG 05/09/21 BC -NG  Imaging None in 24 hrs  Impression/recommendation Group A streptococcal invasive infection with septic shock, Multiorgan failure Necrotizing infection of the left leg s/p debridement Is on ceftriaxone and linezolid Also received 3 doses of IVIG Day 14 of appropriate antibiotic May stop in 48 hrs or so  Encephalopathy Seizures- on keppra Small subdural hemorrhage- normal MRA  Acute hypoxic resp failure has tracheostomy- on the vent  AKI- on CRRT  Anemia due to infection and blood loss from the left leg post suregry Has received multiple units of PRBC  Shock liver  with transaminitis- improving  Afib - now in sinus rhythm- on amiodarone drip- followed by cardiology  Anasarca- getting IVIG and CRRT helping  Discussed the management with her care team

## 2021-05-18 NOTE — Anesthesia Postprocedure Evaluation (Signed)
Anesthesia Post Note  Patient: Alexandra Foster  Procedure(s) Performed: TRACHEOSTOMY (Neck) APPLICATION OF WOUND VAC/WOUND VAC EXCHANGE-Matrix [facility] (Left) APPLICATION OF SKIN SUBSTITUTE  Patient location during evaluation: SICU Anesthesia Type: General Level of consciousness: sedated Pain management: pain level controlled Vital Signs Assessment: post-procedure vital signs reviewed and stable Respiratory status: patient on ventilator - see flowsheet for VS (Trach in place, still on vent) Cardiovascular status: stable Postop Assessment: no apparent nausea or vomiting Anesthetic complications: no Comments: Patient is trached and sedated, still on pressors and currently undergoing CRRT.   No notable events documented.   Last Vitals:  Vitals:   05/18/21 1800 05/18/21 1900  BP: 126/70 (!) 131/54  Pulse: 75 73  Resp: 16 17  Temp: 36.7 C 36.7 C  SpO2: 100% 100%    Last Pain:  Vitals:   05/18/21 0800  TempSrc: Oral  PainSc: 0-No pain                 Martha Clan

## 2021-05-19 DIAGNOSIS — L03116 Cellulitis of left lower limb: Secondary | ICD-10-CM | POA: Diagnosis not present

## 2021-05-19 DIAGNOSIS — I4891 Unspecified atrial fibrillation: Secondary | ICD-10-CM | POA: Diagnosis not present

## 2021-05-19 DIAGNOSIS — A419 Sepsis, unspecified organism: Secondary | ICD-10-CM | POA: Diagnosis not present

## 2021-05-19 DIAGNOSIS — R6521 Severe sepsis with septic shock: Secondary | ICD-10-CM | POA: Diagnosis not present

## 2021-05-19 DIAGNOSIS — I4892 Unspecified atrial flutter: Secondary | ICD-10-CM | POA: Diagnosis not present

## 2021-05-19 LAB — RENAL FUNCTION PANEL
Albumin: 2.9 g/dL — ABNORMAL LOW (ref 3.5–5.0)
Albumin: 3.3 g/dL — ABNORMAL LOW (ref 3.5–5.0)
Anion gap: 11 (ref 5–15)
Anion gap: 11 (ref 5–15)
BUN: 50 mg/dL — ABNORMAL HIGH (ref 8–23)
BUN: 55 mg/dL — ABNORMAL HIGH (ref 8–23)
CO2: 22 mmol/L (ref 22–32)
CO2: 23 mmol/L (ref 22–32)
Calcium: 8.9 mg/dL (ref 8.9–10.3)
Calcium: 9.2 mg/dL (ref 8.9–10.3)
Chloride: 102 mmol/L (ref 98–111)
Chloride: 103 mmol/L (ref 98–111)
Creatinine, Ser: 1.14 mg/dL — ABNORMAL HIGH (ref 0.44–1.00)
Creatinine, Ser: 1.13 mg/dL — ABNORMAL HIGH (ref 0.44–1.00)
GFR, Estimated: 51 mL/min — ABNORMAL LOW (ref >60)
GFR, Estimated: 52 mL/min — ABNORMAL LOW (ref >60)
Glucose, Bld: 282 mg/dL — ABNORMAL HIGH (ref 70–99)
Glucose, Bld: 125 mg/dL — ABNORMAL HIGH (ref 70–99)
Phosphorus: 3 mg/dL (ref 2.5–4.6)
Phosphorus: 1.5 mg/dL — ABNORMAL LOW (ref 2.5–4.6)
Potassium: 4 mmol/L (ref 3.5–5.1)
Potassium: 3.6 mmol/L (ref 3.5–5.1)
Sodium: 135 mmol/L (ref 135–145)
Sodium: 137 mmol/L (ref 135–145)

## 2021-05-19 LAB — CBC WITH DIFFERENTIAL/PLATELET
Band Neutrophils: 0 %
Basophils Absolute: 0.4 10*3/uL — ABNORMAL HIGH (ref 0.0–0.1)
Basophils Relative: 2 %
Blasts: 0 %
Eosinophils Absolute: 0.1 10*3/uL (ref 0.0–0.5)
Eosinophils Relative: 0 %
HCT: 22.6 % — ABNORMAL LOW (ref 36.0–46.0)
Hemoglobin: 7.7 g/dL — ABNORMAL LOW (ref 12.0–15.0)
Immature Granulocytes: 0 %
Lymphocytes Relative: 7 %
Lymphs Abs: 1.7 10*3/uL (ref 0.7–4.0)
MCH: 28.5 pg (ref 26.0–34.0)
MCHC: 34.1 g/dL (ref 30.0–36.0)
MCV: 83.7 fL (ref 80.0–100.0)
Metamyelocytes Relative: 0 %
Monocytes Absolute: 2.2 10*3/uL — ABNORMAL HIGH (ref 0.1–1.0)
Monocytes Relative: 9 %
Myelocytes: 0 %
Neutro Abs: 18.9 10*3/uL — ABNORMAL HIGH (ref 1.7–7.7)
Neutrophils Relative %: 73 %
Other: 0 %
Platelets: 127 10*3/uL — ABNORMAL LOW (ref 150–400)
Promyelocytes Relative: 0 %
RBC: 2.7 MIL/uL — ABNORMAL LOW (ref 3.87–5.11)
RDW: 20.7 % — ABNORMAL HIGH (ref 11.5–15.5)
WBC: 25.9 10*3/uL — ABNORMAL HIGH (ref 4.0–10.5)
nRBC: 28.4 % — ABNORMAL HIGH (ref 0.0–0.2)
nRBC: 7 /100 WBC — ABNORMAL HIGH

## 2021-05-19 LAB — GLUCOSE, CAPILLARY
Glucose-Capillary: 177 mg/dL — ABNORMAL HIGH (ref 70–99)
Glucose-Capillary: 188 mg/dL — ABNORMAL HIGH (ref 70–99)
Glucose-Capillary: 255 mg/dL — ABNORMAL HIGH (ref 70–99)
Glucose-Capillary: 263 mg/dL — ABNORMAL HIGH (ref 70–99)
Glucose-Capillary: 241 mg/dL — ABNORMAL HIGH (ref 70–99)
Glucose-Capillary: 230 mg/dL — ABNORMAL HIGH (ref 70–99)
Glucose-Capillary: 157 mg/dL — ABNORMAL HIGH (ref 70–99)
Glucose-Capillary: 122 mg/dL — ABNORMAL HIGH (ref 70–99)
Glucose-Capillary: 124 mg/dL — ABNORMAL HIGH (ref 70–99)
Glucose-Capillary: 137 mg/dL — ABNORMAL HIGH (ref 70–99)
Glucose-Capillary: 142 mg/dL — ABNORMAL HIGH (ref 70–99)
Glucose-Capillary: 128 mg/dL — ABNORMAL HIGH (ref 70–99)
Glucose-Capillary: 123 mg/dL — ABNORMAL HIGH (ref 70–99)
Glucose-Capillary: 130 mg/dL — ABNORMAL HIGH (ref 70–99)
Glucose-Capillary: 156 mg/dL — ABNORMAL HIGH (ref 70–99)

## 2021-05-19 LAB — MAGNESIUM: Magnesium: 2.7 mg/dL — ABNORMAL HIGH (ref 1.7–2.4)

## 2021-05-19 MED ORDER — INSULIN REGULAR(HUMAN) IN NACL 100-0.9 UT/100ML-% IV SOLN
INTRAVENOUS | Status: DC
  Administered 2021-05-19: 11.5 [IU]/h via INTRAVENOUS
  Filled 2021-05-19: qty 100

## 2021-05-19 MED ORDER — HYDROCORTISONE SOD SUC (PF) 100 MG IJ SOLR: 100.0000 mg | Freq: Two times a day (BID) | INTRAMUSCULAR | Status: DC

## 2021-05-19 MED ORDER — DEXTROSE 50 % IV SOLN: 0.0000 mL | INTRAVENOUS | Status: DC | PRN

## 2021-05-19 MED ORDER — POTASSIUM PHOSPHATES 15 MMOLE/5ML IV SOLN
45.0000 mmol | Freq: Once | INTRAVENOUS | Status: AC
  Administered 2021-05-19: 45 mmol via INTRAVENOUS
  Filled 2021-05-19: qty 15

## 2021-05-19 NOTE — Progress Notes (Signed)
[facility]  PROGRESS NOTE   Subjective:   Dialysis lines clotted. CRRT restarted again More awake  Off of pressors.   Objective:  Vital signs in last 24 hours:  Temp:  [96.3 F (35.7 C)-99.7 F (37.6 C)] 99.7 F (37.6 C) (02/05 1200) Pulse Rate:  [61-88] 88 (02/05 1200) Resp:  [13-27] 24 (02/05 1200) BP: (93-143)/(45-76) 143/55 (02/05 1200) SpO2:  [100 %] 100 % (02/05 1200) FiO2 (%):  [30 %] 30 % (02/05 1128) Weight:  [114.9 kg] 114.9 kg (02/05 0600)  Weight change: -3.7 kg Filed Weights   05/17/21 0445 05/18/21 0413 05/19/21 0600  Weight: 124 kg 118.6 kg 114.9 kg    Intake/Output: I/O last 3 completed shifts: In: 5174.4 [I.V.:1484.5; Other:47; NU/UV:2536.6; IV Piggyback:679.1] Out: 8976 [Urine:13; Drains:100; YQIHK:7425; Stool:800]   Intake/Output this shift:  Total I/O In: 628.1 [I.V.:91.3; NG/GT:345; IV Piggyback:191.8] Out: 570 [Other:570]  Physical Exam: General:  No acute distress  Head:  Normocephalic, atraumatic. Moist oral mucosal membranes  Eyes:  Anicteric  Neck:  Supple  Lungs:   Clear to auscultation, normal effort  Heart:  S1S2 no rubs  Abdomen:   Soft, nontender, bowel sounds present  Extremities:  peripheral edema.  Neurologic:  Awake, alert, following commands  Skin:  No lesions  Access:     Basic Metabolic Panel: Recent Labs  Lab 05/15/21 0313 05/15/21 1708 05/16/21 0422 05/16/21 1646 05/17/21 0350 05/17/21 1604 05/18/21 0305 05/18/21 1614 05/19/21 0420 05/19/21 0421  NA 131*   < > 134*   < > 135 133* 134* 135 135  --   K 3.8   < > 3.1*   < > 4.5 5.1 4.4 3.5 4.0  --   CL 98   < > 101   < > 102 103 100 104 102  --   CO2 24   < > 22   < > 21* 20* 23 21* 22  --   GLUCOSE 228*   < > 156*   < > 103* 159* 197* 231* 282*  --   BUN 48*   < > 45*   < > 44* 52* 38* 48* 50*  --   CREATININE 1.66*   < > 1.32*   < > 1.38* 1.58* 1.12* 1.12* 1.14*  --   CALCIUM 9.2   < > 9.6   < > 8.3* 7.8* 8.3* 8.8* 8.9  --   MG 1.9  --  1.8   --  2.4  --  2.4  --   --  2.7*  PHOS 2.3*   < > 2.4*   < > 3.6 4.6 3.4 2.0* 3.0  --    < > = values in this interval not displayed.    CBC: Recent Labs  Lab 05/15/21 0313 05/16/21 0422 05/17/21 0350 05/18/21 0305 05/19/21 0421  WBC 24.5* 12.6* 11.4* 14.6* 25.9*  NEUTROABS 22.7*  --   --   --  18.9*  HGB 8.7* 9.5* 9.2* 8.3* 7.7*  HCT 26.5* 28.6* 28.0* 25.3* 22.6*  MCV 88.0 85.9 87.5 87.2 83.7  PLT 122* 144* 156 152 127*     Urinalysis: No results for input(s): COLORURINE, LABSPEC, PHURINE, GLUCOSEU, HGBUR, BILIRUBINUR, KETONESUR, PROTEINUR, UROBILINOGEN, NITRITE, LEUKOCYTESUR in the last 72 hours.  Invalid input(s): APPERANCEUR    Imaging: DG Abd 1 View  Result Date: 05/17/2021 CLINICAL DATA:  Nasogastric tube placement EXAM: ABDOMEN - 1 VIEW COMPARISON:  05/15/2021 FINDINGS: Nasogastric tube tip is seen within the epigastric region overlying the  expected mid body of the stomach. Small retained contrast likely within the gastric fundus. Central venous catheter tip noted within the superior right atrium. Retrocardiac atelectasis or infiltrate noted. The abdominal gas pattern is indeterminate due to a paucity of intra-abdominal gas. IMPRESSION: Nasogastric tube tip within the expected mid body of the stomach. Electronically Signed   By: Fidela Salisbury M.D.   On: 05/17/2021 19:37     Medications:     prismasol BGK 4/2.5 500 mL/hr at 05/19/21 0205    prismasol BGK 4/2.5 500 mL/hr at 05/19/21 6063   sodium chloride 10 mL/hr at 05/19/21 1100   albumin human Stopped (05/19/21 1042)   amiodarone 30 mg/hr (05/19/21 1213)   cefTRIAXone (ROCEPHIN)  IV 200 mL/hr at 05/18/21 2100   dextrose     feeding supplement (VITAL AF 1.2 CAL) 65 mL/hr at 05/19/21 1100   phenylephrine (NEO-SYNEPHRINE) Adult infusion Stopped (05/18/21 1428)   phenylephrine (NEO-SYNEPHRINE) Adult infusion     prismasol BGK 4/2.5 2,000 mL/hr at 05/19/21 0160   vasopressin Stopped (05/19/21 0101)    arformoterol   15 mcg Nebulization BID   vitamin C  500 mg Per Tube BID   budesonide (PULMICORT) nebulizer solution  0.25 mg Nebulization BID   chlorhexidine gluconate (MEDLINE KIT)  15 mL Mouth Rinse BID   Chlorhexidine Gluconate Cloth  6 each Topical Q0600   docusate  100 mg Per Tube BID   feeding supplement (PROSource TF)  45 mL Per Tube TID   free water  30 mL Per Tube Q4H   hydrocortisone sod succinate (SOLU-CORTEF) inj  100 mg Intravenous Q12H   insulin aspart  10 Units Subcutaneous Q4H   insulin aspart  3-9 Units Subcutaneous Q4H   insulin detemir  29 Units Subcutaneous Q12H   levETIRAcetam  1,000 mg Per Tube BID   linezolid  600 mg Per Tube Q12H   magic mouthwash w/lidocaine  10 mL Oral QID   mouth rinse  15 mL Mouth Rinse 10 times per day   midodrine  10 mg Per Tube TID WC   multivitamin  1 tablet Per Tube QHS   nutrition supplement (JUVEN)  1 packet Per Tube BID BM   pantoprazole (PROTONIX) IV  40 mg Intravenous Q24H   polyethylene glycol  17 g Per Tube Daily   revefenacin  175 mcg Nebulization Daily   sodium chloride flush  10-40 mL Intracatheter Q12H   zinc sulfate  220 mg Per Tube Daily    Assessment/ Plan:     Principal Problem:   Severe sepsis with septic shock (CODE) (HCC) Active Problems:   COPD (chronic obstructive pulmonary disease) (HCC)   Morbid obesity ([place])   Diabetes mellitus type 2 in obese ([place])   Atrial fibrillation and flutter ([place])   Necrotizing fasciitis ([place])   Acute on chronic respiratory failure ([place])   On mechanically assisted ventilation (HCC)   Shock liver   Anemia associated with acute blood loss   Metabolic acidosis   Encounter for continuous renal replacement therapy (CRRT) for acute renal failure (HCC)   Acute encephalopathy   Group A streptococcal infection   Streptococcal toxic shock syndrome ([place])   Status post tracheostomy ([place])   Subdural hemorrhage (HCC)   Left leg cellulitis  71 year old female with history of asthma/COPD, diabetes,  hypertension, obstructive sleep apnea, COVID-pneumonia initially admitted with lower extremity pain.  She was found to be in septic shock and also was found to have cellulitis with complication of necrotizing fasciitis of  the left lower extremity.  Patient also developed respiratory failure now on the ventilator with progressive multiorgan failure along with acute kidney injury with hypotension needing CRRT.   #1: Acute kidney injury: AKI is secondary to ischemic nephropathy secondary to hypotension complicated with sepsis.  She is off of pressors now.  We will continue CRRT with 100 mL of fluid removal every hour.   #2: Vent failure: Patient is s/p tracheostomy yesterday.   #3: Hyponatremia: Has improved.   #4: Sepsis: S/p necrotizing fasciitis of the lower extremity.  S/p surgery.  Continue antibiotics as per surgery and ID.   Overall prognosis is guarded.  We will continue to monitor closely. Continue CRRT with the present recommendations.   LOS: [place], [facility] 2/5/202312:41 PM

## 2021-05-19 NOTE — Progress Notes (Signed)
Progress Note  Patient Name: Alexandra Foster Date of Encounter: 05/19/2021  [facility] Cardiologist: Kathlyn Sacramento, MD   Subjective   No acute events overnight, s/p trach, breathing trials planned for later today.  Maintaining sinus rhythm on amiodarone drip.  Still needing pressors, antibiotics.  Inpatient Medications    Scheduled Meds:  arformoterol  15 mcg Nebulization BID   vitamin C  500 mg Per Tube BID   budesonide (PULMICORT) nebulizer solution  0.25 mg Nebulization BID   chlorhexidine gluconate (MEDLINE KIT)  15 mL Mouth Rinse BID   Chlorhexidine Gluconate Cloth  6 each Topical Q0600   docusate  100 mg Per Tube BID   feeding supplement (PROSource TF)  45 mL Per Tube TID   free water  30 mL Per Tube Q4H   hydrocortisone sod succinate (SOLU-CORTEF) inj  100 mg Intravenous Q12H   insulin aspart  10 Units Subcutaneous Q4H   insulin aspart  3-9 Units Subcutaneous Q4H   insulin detemir  29 Units Subcutaneous Q12H   levETIRAcetam  1,000 mg Per Tube BID   linezolid  600 mg Per Tube Q12H   magic mouthwash w/lidocaine  10 mL Oral QID   mouth rinse  15 mL Mouth Rinse 10 times per day   midodrine  10 mg Per Tube TID WC   multivitamin  1 tablet Per Tube QHS   nutrition supplement (JUVEN)  1 packet Per Tube BID BM   pantoprazole (PROTONIX) IV  40 mg Intravenous Q24H   polyethylene glycol  17 g Per Tube Daily   revefenacin  175 mcg Nebulization Daily   sodium chloride flush  10-40 mL Intracatheter Q12H   zinc sulfate  220 mg Per Tube Daily   Continuous Infusions:   prismasol BGK 4/2.5 500 mL/hr at 05/19/21 0205    prismasol BGK 4/2.5 500 mL/hr at 05/19/21 0207   sodium chloride 10 mL/hr at 05/19/21 0907   albumin human 60 mL/hr at 05/19/21 1000   amiodarone 30 mg/hr (05/19/21 1000)   cefTRIAXone (ROCEPHIN)  IV 200 mL/hr at 05/18/21 2100   dextrose     feeding supplement (VITAL AF 1.2 CAL) 65 mL/hr at 05/19/21 1000   phenylephrine (NEO-SYNEPHRINE) Adult infusion  Stopped (05/18/21 1428)   phenylephrine (NEO-SYNEPHRINE) Adult infusion     prismasol BGK 4/2.5 2,000 mL/hr at 05/19/21 8546   vasopressin Stopped (05/19/21 0101)   PRN Meds: artificial tears, dextrose, docusate sodium, fentaNYL (SUBLIMAZE) injection, heparin, lip balm, midazolam, ondansetron (ZOFRAN) IV, oxyCODONE, sennosides, sodium chloride, sodium chloride flush   Vital Signs    Vitals:   05/19/21 0725 05/19/21 0736 05/19/21 0800 05/19/21 1128  BP:   (!) 128/49   Pulse:  74 77   Resp:  19 18   Temp:  98.1 F (36.7 C) 98.1 F (36.7 C)   TempSrc:      SpO2: 100% 100% 100% 100%  Weight:      Height:        Intake/Output Summary (Last 24 hours) at 05/19/2021 1148 Last data filed at 05/19/2021 1000 Gross per 24 hour  Intake 3540.58 ml  Output 5890 ml  Net -2349.42 ml   Last 3 Weights 05/19/2021 05/18/2021 05/17/2021  Weight (lbs) 253 lb 4.9 oz 261 lb 7.5 oz 273 lb 5.9 oz  Weight (kg) 114.9 kg 118.6 kg 124 kg      Telemetry    Sinus rhythm- Personally Reviewed  ECG     - Personally Reviewed  Physical Exam  GEN: Somnolent, does not follow commands Neck: S/p trach Cardiac: RRR, no murmurs, Respiratory: Vented breath sounds GI: Soft, nontender, distended  MS: Left lower extremity wound VAC, extensive debridement. Neuro: Unable to assess Psych: Unable to assess  Labs    High Sensitivity Troponin:   Recent Labs  Lab 05/15/21 1708 05/15/21 1926  TROPONINIHS 338* 305*     Chemistry Recent Labs  Lab 05/13/21 0311 05/13/21 1650 05/15/21 0313 05/15/21 1708 05/17/21 0350 05/17/21 1604 05/18/21 0305 05/18/21 1614 05/19/21 0420 05/19/21 0421  NA 131*   < > 131*   < > 135   < > 134* 135 135  --   K 4.0   < > 3.8   < > 4.5   < > 4.4 3.5 4.0  --   CL 98   < > 98   < > 102   < > 100 104 102  --   CO2 26   < > 24   < > 21*   < > 23 21* 22  --   GLUCOSE 159*   < > 228*   < > 103*   < > 197* 231* 282*  --   BUN 36*   < > 48*   < > 44*   < > 38* 48* 50*  --    CREATININE 1.77*   < > 1.66*   < > 1.38*   < > 1.12* 1.12* 1.14*  --   CALCIUM 8.1*   < > 9.2   < > 8.3*   < > 8.3* 8.8* 8.9  --   MG 1.8   < > 1.9   < > 2.4  --  2.4  --   --  2.7*  PROT 5.9*  --  6.6  --  6.2*  --   --   --   --   --   ALBUMIN 2.4*   2.4*   < > 2.6*   2.6*   < > 1.9*   1.9*   < > 2.6*   2.4* 2.7* 2.9*  --   AST 4,114*  --  639*  --  163*  --   --   --   --   --   ALT 1,400*  --  749*  --  326*  --   --   --   --   --   ALKPHOS 266*  --  328*  --  452*  --   --   --   --   --   BILITOT 4.0*  --  5.0*  --  3.6*  --   --   --   --   --   GFRNONAA 30*   < > 33*   < > 41*   < > 52* 52* 51*  --   ANIONGAP 7   < > 9   < > 12   < > '11 10 11  ' --    < > = values in this interval not displayed.    Lipids  Recent Labs  Lab 05/15/21 0313  TRIG 121    Hematology Recent Labs  Lab 05/17/21 0350 05/18/21 0305 05/19/21 0421  WBC 11.4* 14.6* 25.9*  RBC 3.20* 2.90* 2.70*  HGB 9.2* 8.3* 7.7*  HCT 28.0* 25.3* 22.6*  MCV 87.5 87.2 83.7  MCH 28.8 28.6 28.5  MCHC 32.9 32.8 34.1  RDW 20.3* 20.7* 20.7*  PLT 156 152 127*   Thyroid  Recent Labs  Lab 05/13/21 0311 05/14/21 0410  TSH 0.196*  --   FREET4  --  0.48*    BNPNo results for input(s): BNP, PROBNP in the last 168 hours.  DDimer No results for input(s): DDIMER in the last 168 hours.   Radiology    DG Abd 1 View  Result Date: 05/17/2021 CLINICAL DATA:  Nasogastric tube placement EXAM: ABDOMEN - 1 VIEW COMPARISON:  05/15/2021 FINDINGS: Nasogastric tube tip is seen within the epigastric region overlying the expected mid body of the stomach. Small retained contrast likely within the gastric fundus. Central venous catheter tip noted within the superior right atrium. Retrocardiac atelectasis or infiltrate noted. The abdominal gas pattern is indeterminate due to a paucity of intra-abdominal gas. IMPRESSION: Nasogastric tube tip within the expected mid body of the stomach. Electronically Signed   By: Fidela Salisbury M.D.   On:  05/17/2021 19:37    Cardiac Studies   TTE 05/2021 1. Left ventricular ejection fraction, by estimation, is 60 to 65%. The  left ventricle has normal function. The left ventricle has no regional  wall motion abnormalities. There is mild left ventricular hypertrophy.  Left ventricular diastolic parameters  are consistent with Grade I diastolic dysfunction (impaired relaxation).   2. Right ventricular systolic function is normal. The right ventricular  size is normal. There is mildly elevated pulmonary artery systolic  pressure. The estimated right ventricular systolic pressure is 32.2 mmHg.   3. The mitral valve is normal in structure. No evidence of mitral valve  regurgitation. No evidence of mitral stenosis.   4. The aortic valve is normal in structure. Aortic valve regurgitation is  not visualized. No aortic stenosis is present.   5. The inferior vena cava is normal in size with greater than 50%  respiratory variability, suggesting right atrial pressure of 3 mmHg.   Patient Profile     71 y.o. female with history of hypertension, diabetes, obesity, COPD admitted with lower extremity cellulitis, bacteremia and shock.  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  Being seen for A. fib RVR.  Assessment & Plan    A. fib RVR -Maintaining sinus rhythm -Continue amiodarone drip -Transition to p.o. when able to tolerate p.o. -Not on heparin due to anemia, recent surgery -Echo with preserved EF  2.  Necrotizing fasciitis, bacteremia, s/p debridement, wound VAC -Antibiotics as per ICU/ID teams  3.  Respiratory failure s/p trach -Management as per respiratory/ICU team -Plans for breathing trials today  Total encounter time more than 50 minutes  Greater than 50% was spent in counseling and coordination of care with ICU team, RN.     Signed, Kate Sable, MD  05/19/2021, 11:48 AM

## 2021-05-19 NOTE — Plan of Care (Addendum)
Plan of care is to maintain PSV as long as tolerated per Dr. Mortimer Fries. Emesis overnight; TF's held per Domingo Pulse, NP. Vasopressin restarted overnight. Remains on CRRT; tolerating fluid removal as ordered. Hypothermic, requiring Retail banker.  Problem: Education: Goal: Knowledge of General Education information will improve Description: Including pain rating scale, medication(s)/side effects and non-pharmacologic comfort measures Outcome: Progressing   Problem: Health Behavior/Discharge Planning: Goal: Ability to manage health-related needs will improve Outcome: Progressing   Problem: Clinical Measurements: Goal: Ability to maintain clinical measurements within normal limits will improve Outcome: Progressing Goal: Will remain free from infection Outcome: Progressing Goal: Diagnostic test results will improve Outcome: Progressing   Problem: Pain Managment: Goal: General experience of comfort will improve Outcome: Progressing   Problem: Safety: Goal: Ability to remain free from injury will improve Outcome: Progressing   Problem: Skin Integrity: Goal: Risk for impaired skin integrity will decrease Outcome: Progressing   Problem: Activity: Goal: Ability to tolerate increased activity will improve Outcome: Progressing   Problem: Respiratory: Goal: Ability to maintain a clear airway and adequate ventilation will improve Outcome: Progressing   Problem: Role Relationship: Goal: Method of communication will improve Outcome: Progressing   Problem: Education: Goal: Knowledge of disease and its progression will improve Outcome: Progressing   Problem: Clinical Measurements: Goal: Complications related to the disease process or treatment will be avoided or minimized Outcome: Progressing Goal: Dialysis access will remain free of complications Outcome: Progressing   Problem: Fluid Volume: Goal: Fluid volume balance will be maintained or improved Outcome: Progressing   Problem:  Urinary Elimination: Goal: Progression of disease will be identified and treated Outcome: Progressing   Problem: Education: Goal: Knowledge of disease or condition will improve Outcome: Progressing Goal: Knowledge of secondary prevention will improve (SELECT ALL) Outcome: Progressing Goal: Knowledge of patient specific risk factors will improve (INDIVIDUALIZE FOR PATIENT) Outcome: Progressing Goal: Individualized Educational Video(s) Outcome: Progressing   Problem: Coping: Goal: Will verbalize positive feelings about self Outcome: Progressing Goal: Will identify appropriate support needs Outcome: Progressing   Problem: Health Behavior/Discharge Planning: Goal: Ability to manage health-related needs will improve Outcome: Progressing   Problem: Self-Care: Goal: Ability to participate in self-care as condition permits will improve Outcome: Progressing Goal: Verbalization of feelings and concerns over difficulty with self-care will improve Outcome: Progressing Goal: Ability to communicate needs accurately will improve Outcome: Progressing   Problem: Nutrition: Goal: Risk of aspiration will decrease Outcome: Progressing Goal: Dietary intake will improve Outcome: Progressing   Problem: Spontaneous Subarachnoid Hemorrhage Tissue Perfusion: Goal: Complications of Spontaneous Subarachnoid Hemorrhage will be minimized Outcome: Progressing

## 2021-05-19 NOTE — Progress Notes (Signed)
Discussed blood sugar with Dr. Mortimer Fries. Agreed to give the SS insulin and Levemir that is ordered and watch BS closer to evaluate need for gtt vs increase dose of Levemir.

## 2021-05-19 NOTE — Progress Notes (Signed)
Patient ID: Meredeth Furber, female   DOB: 1951/01/04, 71 y.o.   MRN: [id number]     [facility] Day(s): 15.   Interval History: Patient seen and examined, no acute events or new complaints overnight.  Patient continue critically ill on mechanical ventilation.  Patient has been able to be weaned off the pressors.  Still on CRRT.  Getting a little bit more alert without sedation.  No issues with the negative pressure dressing.  Vital signs in last 24 hours: [min-max] current  Temp:  [96.3 F (35.7 C)-99.1 F (37.3 C)] 98.1 F (36.7 C) (02/05 0800) Pulse Rate:  [61-81] 77 (02/05 0800) Resp:  [13-27] 18 (02/05 0800) BP: (93-142)/(45-76) 128/49 (02/05 0800) SpO2:  [100 %] 100 % (02/05 0800) FiO2 (%):  [30 %] 30 % (02/05 0736) Weight:  [114.9 kg] 114.9 kg (02/05 0600)     Height: _0  (165.1 cm) Weight: 114.9 kg BMI (Calculated): 42.15   Physical Exam:  Constitutional: Critically ill Respiratory: On exam ventilation, tracheostomy. Cardiovascular: regular rate and sinus rhythm  Gastrointestinal: soft, non-tender, and non-distended Extremity:.  Negative pressure dressing in place and working adequately.  Adequate suction.  Left foot with adequate capillary refill, soft.  No sign of ischemia.  Labs:  CBC Latest Ref Rng & Units 05/19/2021 05/18/2021 05/17/2021  WBC 4.0 - 10.5 K/uL 25.9(H) 14.6(H) 11.4(H)  Hemoglobin 12.0 - 15.0 g/dL 7.7(L) 8.3(L) 9.2(L)  Hematocrit 36.0 - 46.0 % 22.6(L) 25.3(L) 28.0(L)  Platelets 150 - 400 K/uL 127(L) 152 156   CMP Latest Ref Rng & Units 05/19/2021 05/18/2021 05/18/2021  Glucose 70 - 99 mg/dL 282(H) 231(H) 197(H)  BUN 8 - 23 mg/dL 50(H) 48(H) 38(H)  Creatinine 0.44 - 1.00 mg/dL 1.14(H) 1.12(H) 1.12(H)  Sodium 135 - 145 mmol/L 135 135 134(L)  Potassium 3.5 - 5.1 mmol/L 4.0 3.5 4.4  Chloride 98 - 111 mmol/L 102 104 100  CO2 22 - 32 mmol/L 22 21(L) 23  Calcium 8.9 - 10.3 mg/dL 8.9 8.8(L) 8.3(L)  Total Protein 6.5 - 8.1 g/dL - - -  Total  Bilirubin 0.3 - 1.2 mg/dL - - -  Alkaline Phos 38 - 126 U/L - - -  AST 15 - 41 U/L - - -  ALT 0 - 44 U/L - - -    Imaging studies: No new pertinent imaging studies   Assessment/Plan:  71 y.o. female with left leg necrotizing fasciitis with 9 Day Post-Op s/p debridement, and 2-day postop  Myriad matrix placement complicated by pertinent comorbidities including strep pyogenes bacteremia now in septic shock (resolved), respiratory failure mechanical ventilation, liver failure (resolved), acute renal failure on CRRT, COPD, type 2 diabetes mellitus, hypertension, obstructive sleep apnea, GERD   Left leg necrotizing fasciitis -S/p debridement 05/07/2021 -Second debridement on 05/10/2021.  Application of wound VAC system -S/p myriad matrix placement on 05/17/2021 -Negative pressure dressing working adequately -Patient slowly improving clinically now without pressors -Possible change of negative pressure dressing tomorrow. -I will continue to follow closely.  Arnold Long, MD

## 2021-05-19 NOTE — Consult Note (Signed)
PHARMACY CONSULT NOTE - FOLLOW UP  Pharmacy Consult for Electrolyte Monitoring and Replacement   Recent Labs: Potassium (mmol/L)  Date Value  05/19/2021 4.0  08/11/2013 3.3 (L)   Magnesium (mg/dL)  Date Value  05/19/2021 2.7 (H)   Calcium (mg/dL)  Date Value  05/19/2021 8.9   Calcium, Total (mg/dL)  Date Value  08/11/2013 9.2   Albumin (g/dL)  Date Value  05/19/2021 2.9 (L)  08/11/2013 3.1 (L)   Phosphorus (mg/dL)  Date Value  05/19/2021 3.0   Sodium (mmol/L)  Date Value  05/19/2021 135  08/11/2013 138   Corr Ca= 9.2  Assessment: 71 yo female with a previous medical history of diabetes mellitus type 2, hypertension, obstructive sleep apnea on CPAP, asthma/COPD, GERD, history of COVID-19 pneumonia presenting with lower left extremity pain and chills. Patient found to have severe sepsis and strep pyogenes bacteremia on admission and is currently intubated and sedated. LLL necrotizing fascitis. [facility]  Pharmacy has been consulted to monitor and replace electrolytes.  -on CRRT -s/p trach 2/3, still on vent  Nutrition:  --Feeds per tube --Free water per tube 30 mL Q4H  Goal of Therapy:  Electrolytes within normal limits  Plan:  --No replacement at this time  --Follow-up with am labs  Justin Meisenheimer A 05/19/2021 8:40 AM

## 2021-05-19 NOTE — Progress Notes (Signed)
NAME:  Alexandra Foster, MRN:  [id number], DOB:  1950/05/17, LOS: 26 ADMISSION DATE:  05/04/2021  BRIEF SYNOPSIS 71 y.o female with medical history significant of Asthma/COPD, T2DM, HTN, OSA on CPAP, GERD, COVID-19 pneumonia, and Bilateral lower extremity edema who presented to the ED with chief complaint of LLE pain and chills since Thursday.  Patient was given 30 cc/kg of fluids and started on broad-spectrum antibiotics for sepsis with septic shock.   Admitted to the ICU with severe sepsis with shock secondary to cellulitis of LLE septic shock with LLL necrotizing fascitis post op resp failure and severe septic shock with progressive renal failure on CRRT and progressive multiorgan failure  MRI/CT SHOWS SMALL SAH  Past Medical History     Arthritis   Asthma   COPD (chronic obstructive pulmonary disease) ([place])   Diabetes mellitus without complication ([place])   Endometriosis   GERD (gastroesophageal reflux disease)   Hypertension   Obesity   Sleep apnea    CPAP    Significant Hospital Events   05/04/21: Admitted to the ICU with severe sepsis with shock secondary to cellulitis of LLE 1/23 off pressors +Afib with RVR 1/24 blood pressure low  1/24 to OR for fasciotomy 1/25 post op resp failure with severe septic shock 1/26 remains on pressors 1/27 returns to the OR today for debridement, wound VAC 1/28 overnight with hemodynamic deterioration, required reintubation, currently on pressors 1/29 remains on 3 pressors, CRRT, wound VAC and minimal sedation.  Awakens to voice and touch.  Seen by vascular surgery and awaiting input from general surgery regarding amputation of left leg 1/30 remains on vent remains on pressors 1/31 remains on pressors, on vent, on CRRT 2/1 remains on pressors, on CRRT, MRI/CT shows SAH, afib with RVR, ECHO pending, restarted AMIO at 33 2/2 remains on vent plan for trach tomorrow 2/3 remains on crrt, remains on vent, plan for [facility] and wound vac change in  OR 2/3 S/P TRACH,  Implantation of cellular matrix on left leg 2/4 REMAINS ON PRESSORS, REMAINS ON VENT, REMAINS ON CRRT 2/5 REMAINS ON VENT, OFF PRESSORS, REMAINS ON CRRT  Significant Diagnostic Tests:  1/21: Chest Xray> no active cardiopulmonary process 1/21: CT left lower leg>Diffuse subcutaneous edema may represent cellulitis. No drainable fluid collection/abscess 1/21: Ultrasound lower unilateral left> no DVT   Micro Data:  1/21: SARS-CoV-2 PCR> negative 1/21: Influenza PCR> negative 1/21: Blood culture x2> Streptococcus pyogenes 1/21: Urine Culture> negative 1/21: MRSA PCR>> negative   Antimicrobials:  Vancomycin 1/21> off Cefepime 1/21> off Ceftriaxone 1/21 >> off Penicilli G 1/24>> Linezolid 1/25>>  *Antimicrobials per ID    Interim History / Subjective:  Patient with acute necrotizing fasciitis with emergent fasciotomy Implantation of cellular matrix on left leg OFF PRESSORS On CRRT REMAINS CRITICALLY ILL REMAINS ON VENT S/P TRACH  WEAN OFF SEDATION PLAN FOR PS MODE AND POSSIBLE TCT'S   Vent Mode: PSV;CPAP FiO2 (%):  [30 %] 30 % Set Rate:  [20 bmp] 20 bmp Vt Set:  [460 mL] 460 mL PEEP:  [5 cmH20] 5 cmH20 Pressure Support:  [5 cmH20] 5 cmH20     Objective   Blood pressure (!) 126/54, pulse 78, temperature 98.1 F (36.7 C), resp. rate 20, height '5\' 5"'  (1.651 m), weight 114.9 kg, SpO2 100 %. CVP:  [6 mmHg-14 mmHg] 8 mmHg  Vent Mode: PSV;CPAP FiO2 (%):  [30 %] 30 % Set Rate:  [20 bmp] 20 bmp Vt Set:  [460 mL] 460 mL PEEP:  [5 cmH20]  5 cmH20 Pressure Support:  [5 cmH20] 5 cmH20   Intake/Output Summary (Last 24 hours) at 05/19/2021 0750 Last data filed at 05/19/2021 0700 Gross per 24 hour  Intake 3556.64 ml  Output 6208 ml  Net -2651.36 ml    Filed Weights   05/17/21 0445 05/18/21 0413 05/19/21 0600  Weight: 124 kg 118.6 kg 114.9 kg    REVIEW OF SYSTEMS  PATIENT IS UNABLE TO PROVIDE COMPLETE REVIEW OF SYSTEMS DUE TO SEVERE CRITICAL ILLNESS  AND TOXIC METABOLIC ENCEPHALOPATHY    PHYSICAL EXAMINATION:  GENERAL:critically ill appearing, +resp distress EYES: Pupils equal, round, reactive to light.  No scleral icterus.  MOUTH: Moist mucosal membrane. INTUBATED NECK: Supple.  PULMONARY: +rhonchi, +wheezing CARDIOVASCULAR: S1 and S2.  No murmurs  GASTROINTESTINAL: Soft, nontender, -distended. Positive bowel sounds.  MUSCULOSKELETAL: No swelling, clubbing, or edema.  NEUROLOGIC: obtunded MUSCULOSKELETAL: Left lower extremity with wound VAC in place, surgery redressing.  Status post fasciotomy     Assessment & Plan    Admitted to the ICU with severe sepsis with shock secondary to cellulitis of LLE septic shock with LLL necrotizing fascitis post op resp failure and severe septic shock with progressive renal failure on CRRT and progressive multiorgan failure   Severe ACUTE Hypoxic and Hypercapnic Respiratory Failure -continue Mechanical Ventilator support -Wean Fio2 and PEEP as tolerated -VAP/VENT bundle implementation - Wean PEEP & FiO2 as tolerated, maintain SpO2 > 88% - Head of bed elevated 30 degrees, VAP protocol in place - Plateau pressures less than 30 cm H20  - Intermittent chest x-ray & ABG PRN - Ensure adequate pulmonary hygiene  S/P TRACH WEAN TO PS MODE CONSIDER TCT   SEPTIC shock SOURCE-LLE Acute Hypoxic Respiratory Failure in the setting of worsening septic shock PMHx: COPD Refractory Septic Shock secondary to LLE necrotizing fasciitis NOW OFF PRESSORS Streptococcal toxic shock syndrome STRESS DOSE STEROIDS use vasopressors to keep MAP>65 as needed  INFECTIOUS DISEASE -continue antibiotics as prescribed -follow up ID consultation Group A strep necrotizing fasciitis L LE Streptococcal toxic shock - Continue antibiotics: Penicillin G/linezolid - Status postdebridement, wound VAC per surgery - Poor prognosis -Vascular and general surgery following S/p IVIG   ACUTE KIDNEY INJURY/Renal  Failure -continue Foley Catheter-assess need -Avoid nephrotoxic agents -Follow urine output, BMP -Ensure adequate renal perfusion, optimize oxygenation -Renal dose medications CONSIDER STOPPING CRRT  Intake/Output Summary (Last 24 hours) at 05/19/2021 0753 Last data filed at 05/19/2021 0700 Gross per 24 hour  Intake 3556.64 ml  Output 6208 ml  Net -2651.36 ml    Acute encephalopathy  Toxic metabolic encephalopathy Loaded with keppra; continue at 1gram bid MRI/CT HEAD SHOWS SMALL VOLUME SAH APPRECIATE NEUROSURGERY RECS -Neurology consulted NO ANTICOAGULATION FOR  1 MONTH AND THEN NEED TO RE-ASSESS NO ANTIPLATELET AGENTS FOR 1 MONTH AND THEN NEED TO RE-ASSESS CAN POSSIBLY CONSIDER BABY ASA BUT WOULD NEED TO PROCEED WITH CAUTION CAN CONSIDER  DVT PROPHYLAXIS AFTER SURGERY BASED ON RISK OF BLEEDING   ENDO - ICU hypoglycemic\Hyperglycemia protocol -check FSBS per protocol   GI GI PROPHYLAXIS as indicated  NUTRITIONAL STATUS DIET-->TF's as tolerated Constipation protocol as indicated   ELECTROLYTES -follow labs as needed -replace as needed -pharmacy consultation and following  ENDO - ICU hypoglycemic\Hyperglycemia protocol -check FSBS per protocol   Best practice:  Diet: NPO Pain/Anxiety/Delirium protocol (if indicated): Yes, initiated VAP protocol (if indicated): Yes, initiated DVT prophylaxis: Subcutaneous Heparin GI prophylaxis: PPI Glucose control: ICU hyperglycemia protocol, insulin drip discontinued; SQ insulin Central venous access:  Rt IJV Arterial line:  N/A Foley:  Yes Mobility:  bed rest  PT consulted: N/A Last date of multidisciplinary goals of care discussion [1/27] Code Status:  full code Disposition: ICU    Labs   CBC: Recent Labs  Lab 05/15/21 0313 05/16/21 0422 05/17/21 0350 05/18/21 0305 05/19/21 0421  WBC 24.5* 12.6* 11.4* 14.6* 25.9*  NEUTROABS 22.7*  --   --   --  18.9*  HGB 8.7* 9.5* 9.2* 8.3* 7.7*  HCT 26.5* 28.6* 28.0*  25.3* 22.6*  MCV 88.0 85.9 87.5 87.2 83.7  PLT 122* 144* 156 152 127*     Basic Metabolic Panel: Recent Labs  Lab 05/15/21 0313 05/15/21 1708 05/16/21 0422 05/16/21 1646 05/17/21 0350 05/17/21 1604 05/18/21 0305 05/18/21 1614 05/19/21 0420 05/19/21 0421  NA 131*   < > 134*   < > 135 133* 134* 135 135  --   K 3.8   < > 3.1*   < > 4.5 5.1 4.4 3.5 4.0  --   CL 98   < > 101   < > 102 103 100 104 102  --   CO2 24   < > 22   < > 21* 20* 23 21* 22  --   GLUCOSE 228*   < > 156*   < > 103* 159* 197* 231* 282*  --   BUN 48*   < > 45*   < > 44* 52* 38* 48* 50*  --   CREATININE 1.66*   < > 1.32*   < > 1.38* 1.58* 1.12* 1.12* 1.14*  --   CALCIUM 9.2   < > 9.6   < > 8.3* 7.8* 8.3* 8.8* 8.9  --   MG 1.9  --  1.8  --  2.4  --  2.4  --   --  2.7*  PHOS 2.3*   < > 2.4*   < > 3.6 4.6 3.4 2.0* 3.0  --    < > = values in this interval not displayed.    GFR: Estimated Creatinine Clearance: 56.5 mL/min (A) (by C-G formula based on SCr of 1.14 mg/dL (H)). Recent Labs  Lab 05/13/21 0311 05/14/21 0410 05/15/21 0313 05/16/21 0422 05/17/21 0350 05/18/21 0305 05/19/21 0421  PROCALCITON 20.94 21.12  --   --   --   --   --   WBC 34.8* 28.4*   < > 12.6* 11.4* 14.6* 25.9*   < > = values in this interval not displayed.     Liver Function Tests: Recent Labs  Lab 05/12/21 1025 05/12/21 1603 05/13/21 0311 05/13/21 1650 05/15/21 0313 05/15/21 1708 05/17/21 0350 05/17/21 1604 05/18/21 0305 05/18/21 1614 05/19/21 0420  AST 6,250*  --  4,114*  --  639*  --  163*  --   --   --   --   ALT 1,711*  --  1,400*  --  749*  --  326*  --   --   --   --   ALKPHOS 262*  --  266*  --  328*  --  452*  --   --   --   --   BILITOT 3.9*  --  4.0*  --  5.0*  --  3.6*  --   --   --   --   PROT 6.3*  --  5.9*  --  6.6  --  6.2*  --   --   --   --   ALBUMIN 2.5*   < > 2.4*  2.4*   < > 2.6*   2.6*   < > 1.9*   1.9* 2.2* 2.6*   2.4* 2.7* 2.9*   < > = values in this interval not displayed.    Coagulation  Profile: Recent Labs  Lab 05/14/21 1056 05/16/21 0422 05/17/21 0350  INR 1.8* 1.3* 1.3*     Cardiac Enzymes: Recent Labs  Lab 05/12/21 1603  CKTOTAL 4,670*     HbA1C: Hgb A1c MFr Bld  Date/Time Value Ref Range Status  05/05/2021 04:53 AM 6.7 (H) 4.8 - 5.6 % Final    Comment:    (NOTE)         Prediabetes: 5.7 - 6.4         Diabetes: >6.4         Glycemic control for adults with diabetes: <7.0   02/11/2021 09:41 AM 6.3 (H) <5.7 % of total Hgb Final    Comment:    For someone without known diabetes, a hemoglobin  A1c value between 5.7% and 6.4% is consistent with prediabetes and should be confirmed with a  follow-up test. . For someone with known diabetes, a value <7% indicates that their diabetes is well controlled. A1c targets should be individualized based on duration of diabetes, age, comorbid conditions, and other considerations. . This assay result is consistent with an increased risk of diabetes. . Currently, no consensus exists regarding use of hemoglobin A1c for diagnosis of diabetes for children. .     CBG: Recent Labs  Lab 05/18/21 2354 05/19/21 0059 05/19/21 0158 05/19/21 0402 05/19/21 0721  GLUCAP 196* 177* 188* 255* 263*    No Known Allergies   Medications  Scheduled Meds:  arformoterol  15 mcg Nebulization BID   vitamin C  500 mg Per Tube BID   budesonide (PULMICORT) nebulizer solution  0.25 mg Nebulization BID   chlorhexidine gluconate (MEDLINE KIT)  15 mL Mouth Rinse BID   Chlorhexidine Gluconate Cloth  6 each Topical Q0600   docusate  100 mg Per Tube BID   feeding supplement (PROSource TF)  45 mL Per Tube TID   free water  30 mL Per Tube Q4H   hydrocortisone sod succinate (SOLU-CORTEF) inj  100 mg Intravenous Q8H   insulin aspart  10 Units Subcutaneous Q4H   insulin aspart  3-9 Units Subcutaneous Q4H   insulin detemir  29 Units Subcutaneous Q12H   levETIRAcetam  1,000 mg Per Tube BID   linezolid  600 mg Per Tube Q12H    magic mouthwash w/lidocaine  10 mL Oral QID   mouth rinse  15 mL Mouth Rinse 10 times per day   midodrine  10 mg Per Tube TID WC   multivitamin  1 tablet Per Tube QHS   nutrition supplement (JUVEN)  1 packet Per Tube BID BM   pantoprazole (PROTONIX) IV  40 mg Intravenous Q24H   polyethylene glycol  17 g Per Tube Daily   revefenacin  175 mcg Nebulization Daily   sodium chloride flush  10-40 mL Intracatheter Q12H   zinc sulfate  220 mg Per Tube Daily   Continuous Infusions:   prismasol BGK 4/2.5 500 mL/hr at 05/19/21 0205    prismasol BGK 4/2.5 500 mL/hr at 05/19/21 0207   sodium chloride 10 mL/hr at 05/19/21 0700   albumin human Stopped (05/18/21 2358)   amiodarone 30 mg/hr (05/19/21 0700)   cefTRIAXone (ROCEPHIN)  IV 200 mL/hr at 05/18/21 2100   dextrose     feeding supplement (VITAL AF 1.2  CAL) 1,000 mL (05/19/21 8251)   phenylephrine (NEO-SYNEPHRINE) Adult infusion Stopped (05/18/21 1428)   phenylephrine (NEO-SYNEPHRINE) Adult infusion     prismasol BGK 4/2.5 2,000 mL/hr at 05/19/21 8984   vasopressin Stopped (05/19/21 0101)   PRN Meds:.artificial tears, dextrose, docusate sodium, fentaNYL (SUBLIMAZE) injection, heparin, lip balm, midazolam, ondansetron (ZOFRAN) IV, oxyCODONE, sennosides, sodium chloride, sodium chloride flush     Critical Care Time devoted to patient care services described in this note is 50 minutes.  Critical care was necessary to treat /prevent imminent and life-threatening deterioration. Overall, patient is critically ill, prognosis is guarded.  Patient with Multiorgan failure and at high risk for cardiac arrest and death.    Corrin Parker, M.D.  [facility]  Medical Director [place] Director [facility] Cardio-Pulmonary Department

## 2021-05-20 ENCOUNTER — Inpatient Hospital Stay: Payer: Medicare HMO

## 2021-05-20 ENCOUNTER — Encounter: Payer: Self-pay | Admitting: Otolaryngology

## 2021-05-20 DIAGNOSIS — I4891 Unspecified atrial fibrillation: Secondary | ICD-10-CM | POA: Diagnosis not present

## 2021-05-20 DIAGNOSIS — I4892 Unspecified atrial flutter: Secondary | ICD-10-CM | POA: Diagnosis not present

## 2021-05-20 DIAGNOSIS — A491 Streptococcal infection, unspecified site: Secondary | ICD-10-CM | POA: Diagnosis not present

## 2021-05-20 DIAGNOSIS — B955 Unspecified streptococcus as the cause of diseases classified elsewhere: Secondary | ICD-10-CM | POA: Diagnosis not present

## 2021-05-20 DIAGNOSIS — R7881 Bacteremia: Secondary | ICD-10-CM | POA: Diagnosis not present

## 2021-05-20 LAB — GLUCOSE, CAPILLARY
Glucose-Capillary: 127 mg/dL — ABNORMAL HIGH (ref 70–99)
Glucose-Capillary: 131 mg/dL — ABNORMAL HIGH (ref 70–99)
Glucose-Capillary: 136 mg/dL — ABNORMAL HIGH (ref 70–99)
Glucose-Capillary: 147 mg/dL — ABNORMAL HIGH (ref 70–99)
Glucose-Capillary: 156 mg/dL — ABNORMAL HIGH (ref 70–99)
Glucose-Capillary: 157 mg/dL — ABNORMAL HIGH (ref 70–99)
Glucose-Capillary: 161 mg/dL — ABNORMAL HIGH (ref 70–99)
Glucose-Capillary: 162 mg/dL — ABNORMAL HIGH (ref 70–99)
Glucose-Capillary: 164 mg/dL — ABNORMAL HIGH (ref 70–99)
Glucose-Capillary: 165 mg/dL — ABNORMAL HIGH (ref 70–99)
Glucose-Capillary: 165 mg/dL — ABNORMAL HIGH (ref 70–99)
Glucose-Capillary: 166 mg/dL — ABNORMAL HIGH (ref 70–99)
Glucose-Capillary: 175 mg/dL — ABNORMAL HIGH (ref 70–99)
Glucose-Capillary: 182 mg/dL — ABNORMAL HIGH (ref 70–99)
Glucose-Capillary: 217 mg/dL — ABNORMAL HIGH (ref 70–99)

## 2021-05-20 LAB — CBC WITH DIFFERENTIAL/PLATELET
Abs Immature Granulocytes: 2.39 10*3/uL — ABNORMAL HIGH (ref 0.00–0.07)
Basophils Absolute: 0.4 10*3/uL — ABNORMAL HIGH (ref 0.0–0.1)
Basophils Relative: 2 %
Eosinophils Absolute: 0.1 10*3/uL (ref 0.0–0.5)
Eosinophils Relative: 1 %
HCT: 22.5 % — ABNORMAL LOW (ref 36.0–46.0)
Hemoglobin: 7.8 g/dL — ABNORMAL LOW (ref 12.0–15.0)
Immature Granulocytes: 10 %
Lymphocytes Relative: 5 %
Lymphs Abs: 1.2 10*3/uL (ref 0.7–4.0)
MCH: 28.6 pg (ref 26.0–34.0)
MCHC: 34.7 g/dL (ref 30.0–36.0)
MCV: 82.4 fL (ref 80.0–100.0)
Monocytes Absolute: 1.5 10*3/uL — ABNORMAL HIGH (ref 0.1–1.0)
Monocytes Relative: 6 %
Neutro Abs: 19.4 10*3/uL — ABNORMAL HIGH (ref 1.7–7.7)
Neutrophils Relative %: 76 %
Platelets: 161 10*3/uL (ref 150–400)
RBC: 2.73 MIL/uL — ABNORMAL LOW (ref 3.87–5.11)
RDW: 20.6 % — ABNORMAL HIGH (ref 11.5–15.5)
Smear Review: NORMAL
WBC: 25 10*3/uL — ABNORMAL HIGH (ref 4.0–10.5)
nRBC: 36.7 % — ABNORMAL HIGH (ref 0.0–0.2)

## 2021-05-20 LAB — RENAL FUNCTION PANEL
Albumin: 3.7 g/dL (ref 3.5–5.0)
Albumin: 3.2 g/dL — ABNORMAL LOW (ref 3.5–5.0)
Anion gap: 10 (ref 5–15)
Anion gap: 11 (ref 5–15)
BUN: 36 mg/dL — ABNORMAL HIGH (ref 8–23)
BUN: 44 mg/dL — ABNORMAL HIGH (ref 8–23)
CO2: 24 mmol/L (ref 22–32)
CO2: 22 mmol/L (ref 22–32)
Calcium: 9 mg/dL (ref 8.9–10.3)
Calcium: 9.1 mg/dL (ref 8.9–10.3)
Chloride: 100 mmol/L (ref 98–111)
Chloride: 100 mmol/L (ref 98–111)
Creatinine, Ser: 0.9 mg/dL (ref 0.44–1.00)
Creatinine, Ser: 0.93 mg/dL (ref 0.44–1.00)
GFR, Estimated: >60 mL/min (ref >60)
GFR, Estimated: >60 mL/min (ref >60)
Glucose, Bld: 158 mg/dL — ABNORMAL HIGH (ref 70–99)
Glucose, Bld: 238 mg/dL — ABNORMAL HIGH (ref 70–99)
Phosphorus: 5.1 mg/dL — ABNORMAL HIGH (ref 2.5–4.6)
Phosphorus: 2.5 mg/dL (ref 2.5–4.6)
Potassium: 4.5 mmol/L (ref 3.5–5.1)
Potassium: 4.7 mmol/L (ref 3.5–5.1)
Sodium: 134 mmol/L — ABNORMAL LOW (ref 135–145)
Sodium: 133 mmol/L — ABNORMAL LOW (ref 135–145)

## 2021-05-20 LAB — PATHOLOGIST SMEAR REVIEW

## 2021-05-20 LAB — MAGNESIUM: Magnesium: 2.5 mg/dL — ABNORMAL HIGH (ref 1.7–2.4)

## 2021-05-20 LAB — CORTISOL: Cortisol, Plasma: 36.6 ug/dL

## 2021-05-20 MED ORDER — DOCUSATE SODIUM 50 MG/5ML PO LIQD: 100.0000 mg | Freq: Two times a day (BID) | ORAL | Status: DC | PRN

## 2021-05-20 MED ORDER — FENTANYL CITRATE PF 50 MCG/ML IJ SOSY
25.0000 ug | PREFILLED_SYRINGE | INTRAMUSCULAR | Status: DC | PRN
  Administered 2021-05-21 – 2021-05-28 (×19): 50 ug via INTRAVENOUS
  Administered 2021-05-29: 25 ug via INTRAVENOUS
  Administered 2021-05-29 – 2021-05-31 (×7): 50 ug via INTRAVENOUS
  Filled 2021-05-20 (×27): qty 1

## 2021-05-20 MED ORDER — METRONIDAZOLE 500 MG/100ML IV SOLN
500.0000 mg | Freq: Two times a day (BID) | INTRAVENOUS | Status: AC
  Administered 2021-05-20: 500 mg via INTRAVENOUS
  Filled 2021-05-20: qty 100

## 2021-05-20 MED ORDER — ACETAMINOPHEN 325 MG PO TABS
650.0000 mg | ORAL_TABLET | Freq: Four times a day (QID) | ORAL | Status: DC | PRN
  Administered 2021-05-21 – 2021-06-04 (×5): 650 mg via NASOGASTRIC
  Filled 2021-05-20 (×5): qty 2

## 2021-05-20 MED ORDER — MAGIC MOUTHWASH W/LIDOCAINE
10.0000 mL | Freq: Four times a day (QID) | ORAL | Status: AC
  Administered 2021-05-20 – 2021-05-23 (×12): 10 mL via ORAL
  Filled 2021-05-20 (×15): qty 10

## 2021-05-20 MED ORDER — PIVOT 1.5 CAL PO LIQD
1000.0000 mL | ORAL | Status: DC
  Administered 2021-05-20: 1000 mL
  Filled 2021-05-20: qty 1000

## 2021-05-20 MED ORDER — INSULIN ASPART 100 UNIT/ML IJ SOLN
0.0000 [IU] | INTRAMUSCULAR | Status: DC
  Administered 2021-05-20: 2 [IU] via SUBCUTANEOUS
  Administered 2021-05-20: 3 [IU] via SUBCUTANEOUS
  Administered 2021-05-20 – 2021-05-21 (×2): 2 [IU] via SUBCUTANEOUS
  Administered 2021-05-21 (×2): 1 [IU] via SUBCUTANEOUS
  Administered 2021-05-21: 2 [IU] via SUBCUTANEOUS
  Administered 2021-05-21 – 2021-05-22 (×2): 1 [IU] via SUBCUTANEOUS
  Administered 2021-05-23 (×2): 2 [IU] via SUBCUTANEOUS
  Administered 2021-05-23 (×2): 3 [IU] via SUBCUTANEOUS
  Administered 2021-05-23: 2 [IU] via SUBCUTANEOUS
  Administered 2021-05-23: 1 [IU] via SUBCUTANEOUS
  Administered 2021-05-24 (×4): 2 [IU] via SUBCUTANEOUS
  Administered 2021-05-25: 5 [IU] via SUBCUTANEOUS
  Administered 2021-05-25: 3 [IU] via SUBCUTANEOUS
  Administered 2021-05-25 (×2): 2 [IU] via SUBCUTANEOUS
  Administered 2021-05-25: 3 [IU] via SUBCUTANEOUS
  Filled 2021-05-20 (×24): qty 1

## 2021-05-20 MED ORDER — HEPARIN SODIUM (PORCINE) 5000 UNIT/ML IJ SOLN
5000.0000 [IU] | Freq: Two times a day (BID) | INTRAMUSCULAR | Status: AC
  Administered 2021-05-20 – 2021-05-21 (×3): 5000 [IU] via SUBCUTANEOUS
  Filled 2021-05-20 (×3): qty 1

## 2021-05-20 MED ORDER — INSULIN ASPART 100 UNIT/ML IJ SOLN
7.0000 [IU] | INTRAMUSCULAR | Status: DC
  Administered 2021-05-20 – 2021-05-21 (×4): 7 [IU] via SUBCUTANEOUS
  Filled 2021-05-20 (×4): qty 1

## 2021-05-20 MED ORDER — SODIUM CHLORIDE 0.9 % IV SOLN
3.0000 g | Freq: Three times a day (TID) | INTRAVENOUS | Status: DC
  Administered 2021-05-21: 3 g via INTRAVENOUS
  Filled 2021-05-20: qty 8
  Filled 2021-05-20: qty 3
  Filled 2021-05-20: qty 8

## 2021-05-20 MED ORDER — INSULIN DETEMIR 100 UNIT/ML ~~LOC~~ SOLN
17.0000 [IU] | Freq: Two times a day (BID) | SUBCUTANEOUS | Status: DC
  Administered 2021-05-20 – 2021-05-31 (×19): 17 [IU] via SUBCUTANEOUS
  Filled 2021-05-20 (×25): qty 0.17

## 2021-05-20 MED ORDER — OXYCODONE HCL 5 MG PO TABS
5.0000 mg | ORAL_TABLET | Freq: Four times a day (QID) | ORAL | Status: DC | PRN
  Administered 2021-05-20 – 2021-05-28 (×8): 10 mg
  Filled 2021-05-20 (×7): qty 2

## 2021-05-20 MED ORDER — LEVETIRACETAM 100 MG/ML PO SOLN
500.0000 mg | Freq: Two times a day (BID) | ORAL | Status: DC
  Administered 2021-05-20 (×2): 500 mg
  Filled 2021-05-20 (×4): qty 5

## 2021-05-20 MED ORDER — SODIUM CHLORIDE 0.9 % IV SOLN
250.0000 mg | Freq: Three times a day (TID) | INTRAVENOUS | Status: AC
  Administered 2021-05-20 – 2021-05-23 (×11): 250 mg via INTRAVENOUS
  Filled 2021-05-20 (×12): qty 5

## 2021-05-20 NOTE — Progress Notes (Addendum)
SLP Cancellation Note  Patient Details Name: Alexandra Foster MRN: [id number] DOB: 04-25-1950   Cancelled treatment:       Reason Eval/Treat Not Completed: Patient not medically ready (chart reviewed; consulted RT, NSG and NP) Pt currently on CRRT; warming blanket for min low temps. RT suctioning pt currently; pt has a Shiley #8 cuffed w/ flexible inner cannula. Pt is having her first successful wean today per RT and NP. Will Hold on PMV evaluation today and f/u in the morning if pt continues to tolerate wean; also when Family can be present to help engage pt verbally. NP agreed.      Orinda Kenner, MS, CCC-SLP Speech Language Pathologist Rehab Services; [place] [phone] (ascom) [place] 05/20/2021, 1:36 PM

## 2021-05-20 NOTE — Progress Notes (Signed)
Pt placed on 30% trach collar, tolerating well at this time. Respiratory rate 18/min, sats 100%. Will continue to monitor.

## 2021-05-20 NOTE — Progress Notes (Signed)
Nutrition Follow Up Note   DOCUMENTATION CODES:   Morbid obesity  INTERVENTION:   Change to Pivot 1.5_0 /hr- Initiate at 16m/hr, once pt is tolerating, increase by 190mhr q 8 hours until goal rate is reached.   Free water flushes 304m4 hours to maintain tube patency   Regimen provides 2340kcal/day, 146g/day protein and 1364m26my of free water   Continue Juven Fruit Punch BID via tube, each serving provides 95kcal and 2.5g of protein (amino acids glutamine and arginine)  Rena-vit daily via tube   NUTRITION DIAGNOSIS:   Increased nutrient needs related to wound healing as evidenced by estimated needs.  GOAL:   Provide needs based on ASPEN/SCCM guidelines -previously met with tube feeds   MONITOR:   Labs, Weight trends, TF tolerance, Skin, I & O's, Vent status  ASSESSMENT:   70 y25. female with a history of asthma/COPD, HTN, DMII, obesity, OSA on CPAP, GERD and COVID-19 pneumonia who was admitted 1/22 w/ left LE cellulitis, septic shock, S pyogenes bacteremia, hypotension, new Afib and AKI.  Pt s/p I & D 1/24 Pt s/p I & D with veraflow placement 1/27 Pt s/p Left IJ catheter and CRRT 1/28 Pt s/p CT head 1/31; noted to have small SAH  Pt s/p tracheostomy and cellular matrix placement 2/3   Pt currently on trach collar. NGT in place. Tube feeds held overnight as pt vomited once overnight (a large amount). Pt previously on reglan; this has been discontinued. KUB from today with no significant findings. Plan is for erythromycin and trickle feeds today. Pt continues on CRRT. Per chart, pt is down 18lbs since admission and is down ~20lbs from her UBW. RD will increase calories needs as pt now with trach. Pt is having diarrhea; rectal tube in place.   Medications reviewed and include: vitamin C, heparin, insulin, rena-vit, juven, protonix, miralax, zinc, ceftriaxone, erythromycin, vasopressin   Labs reviewed: Na 134(L), K 4.5 wnl, BUN 36(H), P 5.1(H), Mg 2.5(H) Wbc- 25.0(H),  Hgb 7.8(L), Hct 22.5(L) Cbgs- 162, 161, 157, 131, 136, 127, 164, 165 x 24hrs  Patient is currently intubated on ventilator support MV: 12.1 L/min Temp (24hrs), Avg:97.4 F (36.3 C), Min:96.6 F (35.9 C), Max:99.3 F (37.4 C)  Propofol: none   MAP- >65mm18m Diet Order:   Diet Order             Diet NPO time specified  Diet effective midnight                  EDUCATION NEEDS:   Education needs have been addressed  Skin:  Skin Assessment: Reviewed RN Assessment (40 x 35 cm left leg wound, Stage I coccyx, new wounds left arm)  Last BM:  2/6- 60ml 29mrectal tube  Height:   Ht Readings from Last 1 Encounters:  05/18/21 _1  (1.651 m)    Weight:   Wt Readings from Last 1 Encounters:  05/20/21 112.7 kg    Ideal Body Weight:  56.8 kg  BMI:  Body mass index is 41.35 kg/m.  Estimated Nutritional Needs:   Kcal:  2200-2500kcal/day  Protein:  120-140 grams  Fluid:  1.7-2.0L/day  Alexandra Foster Koleen DistanceD, LDN Please refer to [facility] and/or RD on-call/weekend/after hours pager

## 2021-05-20 NOTE — Progress Notes (Signed)
° °NAME:  Alexandra Foster, MRN:  [id number], DOB:  01/19/1951, LOS: 16 °ADMISSION DATE:  05/04/2021 ° °History of present Illness: ° °70 y.o female with medical history significant of Asthma/COPD, T2DM, HTN, OSA on CPAP, GERD, COVID-19 pneumonia, and Bilateral lower extremity edema who presented to the ED with chief complaint of LLE pain and chills since Thursday.  °Patient was given 30 cc/kg of fluids and started on broad-spectrum antibiotics for sepsis with septic shock.  ° °Admitted to the ICU with severe sepsis with shock secondary to cellulitis of LLE septic shock with LLL necrotizing fascitis post op resp failure and severe septic shock with progressive renal failure on CRRT and progressive multiorgan failure ° °MRI/CT SHOWS SMALL SAH ° °-05/20/21- Patient is improved she is able to follow communication verbally and move extermities. She is on 5L/min Trache collar. We have stopped levophed and working on reducing vasopressin. She remains on CRRT.  Insulin infusion is ongoing and plan to have weight based regimen initiated. Additionally she will have SLP for vocalization and PMV trial as well as ROM training with OT/PT.  Multiple specialists on case appreciate everyone involved.  ° °Past Medical History  °  ° Arthritis  ° Asthma  ° COPD (chronic obstructive pulmonary disease) (HCC)  ° Diabetes mellitus without complication (HCC)  ° Endometriosis  ° GERD (gastroesophageal reflux disease)  ° Hypertension  ° Obesity  ° Sleep apnea  °  CPAP  °  °Significant Hospital Events   °05/04/21: Admitted to the ICU with severe sepsis with shock secondary to cellulitis of LLE °1/23 off pressors +Afib with RVR °1/24 blood pressure low  °1/24 to OR for fasciotomy °1/25 post op resp failure with severe septic shock °1/26 remains on pressors °1/27 returns to the OR today for debridement, wound VAC °1/28 overnight with hemodynamic deterioration, required reintubation, currently on pressors °1/29 remains on 3 pressors, CRRT, wound  VAC and minimal sedation.  Awakens to voice and touch.  Seen by vascular surgery and awaiting input from general surgery regarding amputation of left leg °1/30 remains on vent remains on pressors °1/31 remains on pressors, on vent, on CRRT °2/1 remains on pressors, on CRRT, MRI/CT shows SAH, afib with RVR, ECHO pending, restarted AMIO at 33 °2/2 remains on vent plan for trach tomorrow °2/3 remains on crrt, remains on vent, plan for TRACH and wound vac change in OR °2/3 S/P TRACH,  Implantation of cellular matrix on left leg °2/4 REMAINS ON PRESSORS, REMAINS ON VENT, REMAINS ON CRRT °2/5 REMAINS ON VENT, OFF PRESSORS, REMAINS ON CRRT ° °Significant Diagnostic Tests:  °1/21: Chest Xray> no active cardiopulmonary process °1/21: CT left lower leg>Diffuse subcutaneous edema may represent cellulitis. No drainable fluid collection/abscess °1/21: Ultrasound lower unilateral left> no DVT °  °Micro Data:  °1/21: SARS-CoV-2 PCR> negative °1/21: Influenza PCR> negative °1/21: Blood culture x2> Streptococcus pyogenes °1/21: Urine Culture> negative °1/21: MRSA PCR>> negative °  °Antimicrobials:  °Vancomycin 1/21> off °Cefepime 1/21> off °Ceftriaxone 1/21 >> off °Penicilli G 1/24>> °Linezolid 1/25>> ° °*Antimicrobials per ID ° ° ° °Interim History / Subjective:  °Patient with acute necrotizing fasciitis with emergent fasciotomy °Implantation of cellular matrix on left leg °OFF PRESSORS °On CRRT °REMAINS CRITICALLY ILL °REMAINS ON VENT °S/P TRACH  °WEAN OFF SEDATION ° °Trache collar on 5L/min ° ° ° ° °Objective   °Blood pressure (!) 103/57, pulse 70, temperature (!) 97 °F (36.1 °C), temperature source Rectal, resp. rate (!) 23, height 5' 5" (1.651 m), weight 112.7   NAME:  Alexandra Foster, MRN:  [id number], DOB:  Aug 21, 1950, LOS: 34 ADMISSION DATE:  05/04/2021  History of present Illness:  71 y.o female with medical history significant of Asthma/COPD, T2DM, HTN, OSA on CPAP, GERD, COVID-19 pneumonia, and Bilateral lower extremity edema who presented to the ED with chief complaint of LLE pain and chills since Thursday.  Patient was given 30 cc/kg of fluids and started on broad-spectrum antibiotics for sepsis with septic shock.   Admitted to the ICU with severe sepsis with shock secondary to cellulitis of LLE septic shock with LLL necrotizing fascitis post op resp failure and severe septic shock with progressive renal failure on CRRT and progressive multiorgan failure  MRI/CT SHOWS SMALL SAH  -05/20/21- Patient is improved she is able to follow communication verbally and move extermities. She is on 5L/min Trache collar. We have stopped levophed and working on reducing vasopressin. She remains on CRRT.  Insulin infusion is ongoing and plan to have weight based regimen initiated. Additionally she will have SLP for vocalization and PMV trial as well as ROM training with OT/PT.  Multiple specialists on case appreciate everyone involved.   Past Medical History     Arthritis   Asthma   COPD (chronic obstructive pulmonary disease) ([place])   Diabetes mellitus without complication ([place])   Endometriosis   GERD (gastroesophageal reflux disease)   Hypertension   Obesity   Sleep apnea    CPAP    Significant Hospital Events   05/04/21: Admitted to the ICU with severe sepsis with shock secondary to cellulitis of LLE 1/23 off pressors +Afib with RVR 1/24 blood pressure low  1/24 to OR for fasciotomy 1/25 post op resp failure with severe septic shock 1/26 remains on pressors 1/27 returns to the OR today for debridement, wound VAC 1/28 overnight with hemodynamic deterioration, required reintubation, currently on pressors 1/29 remains on 3 pressors, CRRT, wound  VAC and minimal sedation.  Awakens to voice and touch.  Seen by vascular surgery and awaiting input from general surgery regarding amputation of left leg 1/30 remains on vent remains on pressors 1/31 remains on pressors, on vent, on CRRT 2/1 remains on pressors, on CRRT, MRI/CT shows SAH, afib with RVR, ECHO pending, restarted AMIO at 33 2/2 remains on vent plan for trach tomorrow 2/3 remains on crrt, remains on vent, plan for [facility] and wound vac change in OR 2/3 S/P TRACH,  Implantation of cellular matrix on left leg 2/4 REMAINS ON PRESSORS, REMAINS ON VENT, REMAINS ON CRRT 2/5 REMAINS ON VENT, OFF PRESSORS, REMAINS ON CRRT  Significant Diagnostic Tests:  1/21: Chest Xray> no active cardiopulmonary process 1/21: CT left lower leg>Diffuse subcutaneous edema may represent cellulitis. No drainable fluid collection/abscess 1/21: Ultrasound lower unilateral left> no DVT   Micro Data:  1/21: SARS-CoV-2 PCR> negative 1/21: Influenza PCR> negative 1/21: Blood culture x2> Streptococcus pyogenes 1/21: Urine Culture> negative 1/21: MRSA PCR>> negative   Antimicrobials:  Vancomycin 1/21> off Cefepime 1/21> off Ceftriaxone 1/21 >> off Penicilli G 1/24>> Linezolid 1/25>>  *Antimicrobials per ID    Interim History / Subjective:  Patient with acute necrotizing fasciitis with emergent fasciotomy Implantation of cellular matrix on left leg OFF PRESSORS On CRRT REMAINS CRITICALLY ILL REMAINS ON VENT S/P TRACH  WEAN OFF SEDATION  Trache collar on 5L/min     Objective   Blood pressure (!) 103/57, pulse 70, temperature (!) 97 F (36.1 C), temperature source Rectal, resp. rate (!) 23, height 5' 5" (1.651 m), weight  ° °NAME:  Alexandra Foster, MRN:  [id number], DOB:  01/19/1951, LOS: 16 °ADMISSION DATE:  05/04/2021 ° °History of present Illness: ° °70 y.o female with medical history significant of Asthma/COPD, T2DM, HTN, OSA on CPAP, GERD, COVID-19 pneumonia, and Bilateral lower extremity edema who presented to the ED with chief complaint of LLE pain and chills since Thursday.  °Patient was given 30 cc/kg of fluids and started on broad-spectrum antibiotics for sepsis with septic shock.  ° °Admitted to the ICU with severe sepsis with shock secondary to cellulitis of LLE septic shock with LLL necrotizing fascitis post op resp failure and severe septic shock with progressive renal failure on CRRT and progressive multiorgan failure ° °MRI/CT SHOWS SMALL SAH ° °-05/20/21- Patient is improved she is able to follow communication verbally and move extermities. She is on 5L/min Trache collar. We have stopped levophed and working on reducing vasopressin. She remains on CRRT.  Insulin infusion is ongoing and plan to have weight based regimen initiated. Additionally she will have SLP for vocalization and PMV trial as well as ROM training with OT/PT.  Multiple specialists on case appreciate everyone involved.  ° °Past Medical History  °  ° Arthritis  ° Asthma  ° COPD (chronic obstructive pulmonary disease) (HCC)  ° Diabetes mellitus without complication (HCC)  ° Endometriosis  ° GERD (gastroesophageal reflux disease)  ° Hypertension  ° Obesity  ° Sleep apnea  °  CPAP  °  °Significant Hospital Events   °05/04/21: Admitted to the ICU with severe sepsis with shock secondary to cellulitis of LLE °1/23 off pressors +Afib with RVR °1/24 blood pressure low  °1/24 to OR for fasciotomy °1/25 post op resp failure with severe septic shock °1/26 remains on pressors °1/27 returns to the OR today for debridement, wound VAC °1/28 overnight with hemodynamic deterioration, required reintubation, currently on pressors °1/29 remains on 3 pressors, CRRT, wound  VAC and minimal sedation.  Awakens to voice and touch.  Seen by vascular surgery and awaiting input from general surgery regarding amputation of left leg °1/30 remains on vent remains on pressors °1/31 remains on pressors, on vent, on CRRT °2/1 remains on pressors, on CRRT, MRI/CT shows SAH, afib with RVR, ECHO pending, restarted AMIO at 33 °2/2 remains on vent plan for trach tomorrow °2/3 remains on crrt, remains on vent, plan for TRACH and wound vac change in OR °2/3 S/P TRACH,  Implantation of cellular matrix on left leg °2/4 REMAINS ON PRESSORS, REMAINS ON VENT, REMAINS ON CRRT °2/5 REMAINS ON VENT, OFF PRESSORS, REMAINS ON CRRT ° °Significant Diagnostic Tests:  °1/21: Chest Xray> no active cardiopulmonary process °1/21: CT left lower leg>Diffuse subcutaneous edema may represent cellulitis. No drainable fluid collection/abscess °1/21: Ultrasound lower unilateral left> no DVT °  °Micro Data:  °1/21: SARS-CoV-2 PCR> negative °1/21: Influenza PCR> negative °1/21: Blood culture x2> Streptococcus pyogenes °1/21: Urine Culture> negative °1/21: MRSA PCR>> negative °  °Antimicrobials:  °Vancomycin 1/21> off °Cefepime 1/21> off °Ceftriaxone 1/21 >> off °Penicilli G 1/24>> °Linezolid 1/25>> ° °*Antimicrobials per ID ° ° ° °Interim History / Subjective:  °Patient with acute necrotizing fasciitis with emergent fasciotomy °Implantation of cellular matrix on left leg °OFF PRESSORS °On CRRT °REMAINS CRITICALLY ILL °REMAINS ON VENT °S/P TRACH  °WEAN OFF SEDATION ° °Trache collar on 5L/min ° ° ° ° °Objective   °Blood pressure (!) 103/57, pulse 70, temperature (!) 97 °F (36.1 °C), temperature source Rectal, resp. rate (!) 23, height 5' 5" (1.651 m), weight 112.7   ° °NAME:  Alexandra Foster, MRN:  [id number], DOB:  01/19/1951, LOS: 16 °ADMISSION DATE:  05/04/2021 ° °History of present Illness: ° °70 y.o female with medical history significant of Asthma/COPD, T2DM, HTN, OSA on CPAP, GERD, COVID-19 pneumonia, and Bilateral lower extremity edema who presented to the ED with chief complaint of LLE pain and chills since Thursday.  °Patient was given 30 cc/kg of fluids and started on broad-spectrum antibiotics for sepsis with septic shock.  ° °Admitted to the ICU with severe sepsis with shock secondary to cellulitis of LLE septic shock with LLL necrotizing fascitis post op resp failure and severe septic shock with progressive renal failure on CRRT and progressive multiorgan failure ° °MRI/CT SHOWS SMALL SAH ° °-05/20/21- Patient is improved she is able to follow communication verbally and move extermities. She is on 5L/min Trache collar. We have stopped levophed and working on reducing vasopressin. She remains on CRRT.  Insulin infusion is ongoing and plan to have weight based regimen initiated. Additionally she will have SLP for vocalization and PMV trial as well as ROM training with OT/PT.  Multiple specialists on case appreciate everyone involved.  ° °Past Medical History  °  ° Arthritis  ° Asthma  ° COPD (chronic obstructive pulmonary disease) (HCC)  ° Diabetes mellitus without complication (HCC)  ° Endometriosis  ° GERD (gastroesophageal reflux disease)  ° Hypertension  ° Obesity  ° Sleep apnea  °  CPAP  °  °Significant Hospital Events   °05/04/21: Admitted to the ICU with severe sepsis with shock secondary to cellulitis of LLE °1/23 off pressors +Afib with RVR °1/24 blood pressure low  °1/24 to OR for fasciotomy °1/25 post op resp failure with severe septic shock °1/26 remains on pressors °1/27 returns to the OR today for debridement, wound VAC °1/28 overnight with hemodynamic deterioration, required reintubation, currently on pressors °1/29 remains on 3 pressors, CRRT, wound  VAC and minimal sedation.  Awakens to voice and touch.  Seen by vascular surgery and awaiting input from general surgery regarding amputation of left leg °1/30 remains on vent remains on pressors °1/31 remains on pressors, on vent, on CRRT °2/1 remains on pressors, on CRRT, MRI/CT shows SAH, afib with RVR, ECHO pending, restarted AMIO at 33 °2/2 remains on vent plan for trach tomorrow °2/3 remains on crrt, remains on vent, plan for TRACH and wound vac change in OR °2/3 S/P TRACH,  Implantation of cellular matrix on left leg °2/4 REMAINS ON PRESSORS, REMAINS ON VENT, REMAINS ON CRRT °2/5 REMAINS ON VENT, OFF PRESSORS, REMAINS ON CRRT ° °Significant Diagnostic Tests:  °1/21: Chest Xray> no active cardiopulmonary process °1/21: CT left lower leg>Diffuse subcutaneous edema may represent cellulitis. No drainable fluid collection/abscess °1/21: Ultrasound lower unilateral left> no DVT °  °Micro Data:  °1/21: SARS-CoV-2 PCR> negative °1/21: Influenza PCR> negative °1/21: Blood culture x2> Streptococcus pyogenes °1/21: Urine Culture> negative °1/21: MRSA PCR>> negative °  °Antimicrobials:  °Vancomycin 1/21> off °Cefepime 1/21> off °Ceftriaxone 1/21 >> off °Penicilli G 1/24>> °Linezolid 1/25>> ° °*Antimicrobials per ID ° ° ° °Interim History / Subjective:  °Patient with acute necrotizing fasciitis with emergent fasciotomy °Implantation of cellular matrix on left leg °OFF PRESSORS °On CRRT °REMAINS CRITICALLY ILL °REMAINS ON VENT °S/P TRACH  °WEAN OFF SEDATION ° °Trache collar on 5L/min ° ° ° ° °Objective   °Blood pressure (!) 103/57, pulse 70, temperature (!) 97 °F (36.1 °C), temperature source Rectal, resp. rate (!) 23, height 5' 5" (1.651 m), weight 112.7   ° °NAME:  Alexandra Foster, MRN:  [id number], DOB:  01/19/1951, LOS: 16 °ADMISSION DATE:  05/04/2021 ° °History of present Illness: ° °70 y.o female with medical history significant of Asthma/COPD, T2DM, HTN, OSA on CPAP, GERD, COVID-19 pneumonia, and Bilateral lower extremity edema who presented to the ED with chief complaint of LLE pain and chills since Thursday.  °Patient was given 30 cc/kg of fluids and started on broad-spectrum antibiotics for sepsis with septic shock.  ° °Admitted to the ICU with severe sepsis with shock secondary to cellulitis of LLE septic shock with LLL necrotizing fascitis post op resp failure and severe septic shock with progressive renal failure on CRRT and progressive multiorgan failure ° °MRI/CT SHOWS SMALL SAH ° °-05/20/21- Patient is improved she is able to follow communication verbally and move extermities. She is on 5L/min Trache collar. We have stopped levophed and working on reducing vasopressin. She remains on CRRT.  Insulin infusion is ongoing and plan to have weight based regimen initiated. Additionally she will have SLP for vocalization and PMV trial as well as ROM training with OT/PT.  Multiple specialists on case appreciate everyone involved.  ° °Past Medical History  °  ° Arthritis  ° Asthma  ° COPD (chronic obstructive pulmonary disease) (HCC)  ° Diabetes mellitus without complication (HCC)  ° Endometriosis  ° GERD (gastroesophageal reflux disease)  ° Hypertension  ° Obesity  ° Sleep apnea  °  CPAP  °  °Significant Hospital Events   °05/04/21: Admitted to the ICU with severe sepsis with shock secondary to cellulitis of LLE °1/23 off pressors +Afib with RVR °1/24 blood pressure low  °1/24 to OR for fasciotomy °1/25 post op resp failure with severe septic shock °1/26 remains on pressors °1/27 returns to the OR today for debridement, wound VAC °1/28 overnight with hemodynamic deterioration, required reintubation, currently on pressors °1/29 remains on 3 pressors, CRRT, wound  VAC and minimal sedation.  Awakens to voice and touch.  Seen by vascular surgery and awaiting input from general surgery regarding amputation of left leg °1/30 remains on vent remains on pressors °1/31 remains on pressors, on vent, on CRRT °2/1 remains on pressors, on CRRT, MRI/CT shows SAH, afib with RVR, ECHO pending, restarted AMIO at 33 °2/2 remains on vent plan for trach tomorrow °2/3 remains on crrt, remains on vent, plan for TRACH and wound vac change in OR °2/3 S/P TRACH,  Implantation of cellular matrix on left leg °2/4 REMAINS ON PRESSORS, REMAINS ON VENT, REMAINS ON CRRT °2/5 REMAINS ON VENT, OFF PRESSORS, REMAINS ON CRRT ° °Significant Diagnostic Tests:  °1/21: Chest Xray> no active cardiopulmonary process °1/21: CT left lower leg>Diffuse subcutaneous edema may represent cellulitis. No drainable fluid collection/abscess °1/21: Ultrasound lower unilateral left> no DVT °  °Micro Data:  °1/21: SARS-CoV-2 PCR> negative °1/21: Influenza PCR> negative °1/21: Blood culture x2> Streptococcus pyogenes °1/21: Urine Culture> negative °1/21: MRSA PCR>> negative °  °Antimicrobials:  °Vancomycin 1/21> off °Cefepime 1/21> off °Ceftriaxone 1/21 >> off °Penicilli G 1/24>> °Linezolid 1/25>> ° °*Antimicrobials per ID ° ° ° °Interim History / Subjective:  °Patient with acute necrotizing fasciitis with emergent fasciotomy °Implantation of cellular matrix on left leg °OFF PRESSORS °On CRRT °REMAINS CRITICALLY ILL °REMAINS ON VENT °S/P TRACH  °WEAN OFF SEDATION ° °Trache collar on 5L/min ° ° ° ° °Objective   °Blood pressure (!) 103/57, pulse 70, temperature (!) 97 °F (36.1 °C), temperature source Rectal, resp. rate (!) 23, height 5' 5" (1.651 m), weight 112.7

## 2021-05-20 NOTE — Plan of Care (Signed)
Neuro: following commands, nodding in response to questions, responds to voice, will remain alert for short periods of time Resp: stable on trach collar throughout the day, plan for vent overnight CV: bair hugger used for thermoregulation off and on throughout the day, vital signs stable, continuing to wean vasopressin as tolerated, edema improving, all pulses stable GIGU: foley/flexiseal in place, tube feeds restarted at trickle rate and tolerating Skin: clean, dry, popped blisters on left forearm, lower abdomen and left flank-foam, left lower leg dressing change completed by Dr. Windell Moment today-possible plan for next dressing change on Friday Social: family visiting throughout the day, all questions and concerns addressed  Events: CRRT d/c'd   Problem: Education: Goal: Knowledge of General Education information will improve Description: Including pain rating scale, medication(s)/side effects and non-pharmacologic comfort measures Outcome: Progressing   Problem: Health Behavior/Discharge Planning: Goal: Ability to manage health-related needs will improve Outcome: Progressing   Problem: Clinical Measurements: Goal: Ability to maintain clinical measurements within normal limits will improve Outcome: Progressing Goal: Will remain free from infection Outcome: Progressing Goal: Diagnostic test results will improve Outcome: Progressing   Problem: Pain Managment: Goal: General experience of comfort will improve Outcome: Progressing   Problem: Safety: Goal: Ability to remain free from injury will improve Outcome: Progressing   Problem: Skin Integrity: Goal: Risk for impaired skin integrity will decrease Outcome: Progressing   Problem: Activity: Goal: Ability to tolerate increased activity will improve Outcome: Progressing   Problem: Respiratory: Goal: Ability to maintain a clear airway and adequate ventilation will improve Outcome: Progressing   Problem: Role  Relationship: Goal: Method of communication will improve Outcome: Progressing   Problem: Education: Goal: Knowledge of disease and its progression will improve Outcome: Progressing   Problem: Clinical Measurements: Goal: Complications related to the disease process or treatment will be avoided or minimized Outcome: Progressing Goal: Dialysis access will remain free of complications Outcome: Progressing   Problem: Fluid Volume: Goal: Fluid volume balance will be maintained or improved Outcome: Progressing   Problem: Urinary Elimination: Goal: Progression of disease will be identified and treated Outcome: Progressing   Problem: Education: Goal: Knowledge of disease or condition will improve Outcome: Progressing Goal: Knowledge of secondary prevention will improve (SELECT ALL) Outcome: Progressing Goal: Knowledge of patient specific risk factors will improve (INDIVIDUALIZE FOR PATIENT) Outcome: Progressing Goal: Individualized Educational Video(s) Outcome: Progressing   Problem: Coping: Goal: Will verbalize positive feelings about self Outcome: Progressing Goal: Will identify appropriate support needs Outcome: Progressing   Problem: Health Behavior/Discharge Planning: Goal: Ability to manage health-related needs will improve Outcome: Progressing   Problem: Self-Care: Goal: Ability to participate in self-care as condition permits will improve Outcome: Progressing Goal: Verbalization of feelings and concerns over difficulty with self-care will improve Outcome: Progressing Goal: Ability to communicate needs accurately will improve Outcome: Progressing   Problem: Nutrition: Goal: Risk of aspiration will decrease Outcome: Progressing Goal: Dietary intake will improve Outcome: Progressing   Problem: Spontaneous Subarachnoid Hemorrhage Tissue Perfusion: Goal: Complications of Spontaneous Subarachnoid Hemorrhage will be minimized Outcome: Progressing

## 2021-05-20 NOTE — Progress Notes (Signed)
[facility]  PROGRESS NOTE   Subjective:   CRRT Net -2 liters  Amiodarone gtt Vasopressin gtt  Family at bedside.   Objective:  Vital signs in last 24 hours:  Temp:  [96.6 F (35.9 C)-99.3 F (37.4 C)] 97.3 F (36.3 C) (02/06 1000) Pulse Rate:  [54-87] 66 (02/06 1131) Resp:  [15-27] 22 (02/06 1131) BP: (97-151)/(29-97) 116/49 (02/06 1131) SpO2:  [100 %] 100 % (02/06 1131) FiO2 (%):  [28 %-30 %] 30 % (02/06 1131) Weight:  [112.7 kg] 112.7 kg (02/06 0600)  Weight change: -2.2 kg Filed Weights   05/18/21 0413 05/19/21 0600 05/20/21 0600  Weight: 118.6 kg 114.9 kg 112.7 kg    Intake/Output: I/O last 3 completed shifts: In: 4658.9 [I.V.:969.3; Other:67; NG/GT:2393.8; IV Piggyback:1228.9] Out: 2703 [Urine:5; Emesis/NG output:100; Drains:150; JKKXF:8182; Stool:1960]   Intake/Output this shift:  Total I/O In: 200 [I.V.:100; Other:10; NG/GT:90] Out: 548 [Other:548]  Physical Exam: General:  Critically ill  Head:  ETT  Eyes:  Anicteric  Neck:  Trachea collar  Lungs:   Trach collar, diminished bilaterally  Heart:  regular  Abdomen:   Soft, nontender  Extremities:  +peripheral edema.  Neurologic:  Off sedation  Skin:  No lesions  Access: Left IJ temp HD catheter    Basic Metabolic Panel: Recent Labs  Lab 05/16/21 0422 05/16/21 1646 05/17/21 0350 05/17/21 1604 05/18/21 0305 05/18/21 1614 05/19/21 0420 05/19/21 0421 05/19/21 1606 05/20/21 0549 05/20/21 0550  NA 134*   < > 135   < > 134* 135 135  --  137 134*  --   K 3.1*   < > 4.5   < > 4.4 3.5 4.0  --  3.6 4.5  --   CL 101   < > 102   < > 100 104 102  --  103 100  --   CO2 22   < > 21*   < > 23 21* 22  --  23 24  --   GLUCOSE 156*   < > 103*   < > 197* 231* 282*  --  125* 158*  --   BUN 45*   < > 44*   < > 38* 48* 50*  --  55* 36*  --   CREATININE 1.32*   < > 1.38*   < > 1.12* 1.12* 1.14*  --  1.13* 0.90  --   CALCIUM 9.6   < > 8.3*   < > 8.3* 8.8* 8.9  --  9.2 9.0  --   MG 1.8  --  2.4   --  2.4  --   --  2.7*  --   --  2.5*  PHOS 2.4*   < > 3.6   < > 3.4 2.0* 3.0  --  1.5* 5.1*  --    < > = values in this interval not displayed.     CBC: Recent Labs  Lab 05/15/21 0313 05/16/21 0422 05/17/21 0350 05/18/21 0305 05/19/21 0421 05/20/21 0550  WBC 24.5* 12.6* 11.4* 14.6* 25.9* 25.0*  NEUTROABS 22.7*  --   --   --  18.9* 19.4*  HGB 8.7* 9.5* 9.2* 8.3* 7.7* 7.8*  HCT 26.5* 28.6* 28.0* 25.3* 22.6* 22.5*  MCV 88.0 85.9 87.5 87.2 83.7 82.4  PLT 122* 144* 156 152 127* 161      Urinalysis: No results for input(s): COLORURINE, LABSPEC, PHURINE, GLUCOSEU, HGBUR, BILIRUBINUR, KETONESUR, PROTEINUR, UROBILINOGEN, NITRITE, LEUKOCYTESUR in the last 72 hours.  Invalid input(s): APPERANCEUR  Imaging: DG Abd 1 View  Result Date: 05/20/2021 CLINICAL DATA:  Vomiting EXAM: ABDOMEN - 1 VIEW COMPARISON:  Abdominal radiograph dated May 17, 2021 FINDINGS: NG tube tip and side port within the stomach. Paucity of bowel gas somewhat limits evaluation. A few nondilated gas-filled loops of bowel are seen in the pelvis. IMPRESSION: Paucity of bowel gas somewhat limits evaluation. A few nondilated gas-filled loops of bowel are seen in the pelvis. Electronically Signed   By: Yetta Glassman M.D.   On: 05/20/2021 08:12   DG Chest Port 1 View  Result Date: 05/20/2021 CLINICAL DATA:  Vomiting EXAM: PORTABLE CHEST 1 VIEW COMPARISON:  05/15/2021 chest radiograph. FINDINGS: Tracheostomy tube tip overlies the tracheal air column at the thoracic inlet. Right internal jugular central venous catheter terminates over the cavoatrial junction. Enteric tube enters stomach with the tip not seen on this image. Left internal jugular central venous catheter terminates in the upper third of the SVC. Stable cardiomediastinal silhouette with mild cardiomegaly. No pneumothorax. Trace bilateral pleural effusions, similar. Borderline mild pulmonary edema, improved. Improved lung volumes with residual mild left  basilar atelectasis. IMPRESSION: 1. Mild position support structures.  No pneumothorax. 2. Borderline mild congestive heart failure, improved. 3. Trace bilateral pleural effusions, similar. 4. Improved lung volumes with residual mild left basilar atelectasis. Electronically Signed   By: Ilona Sorrel M.D.   On: 05/20/2021 08:12     Medications:     prismasol BGK 4/2.5 500 mL/hr at 05/20/21 0806    prismasol BGK 4/2.5 500 mL/hr at 05/20/21 0810   sodium chloride Stopped (05/19/21 2139)   amiodarone 30 mg/hr (05/20/21 1155)   cefTRIAXone (ROCEPHIN)  IV 2 g (05/19/21 1604)   erythromycin     feeding supplement (VITAL AF 1.2 CAL) Stopped (05/19/21 1945)   insulin 0.7 Units/hr (05/20/21 1100)   prismasol BGK 4/2.5 2,000 mL/hr at 05/20/21 0745   vasopressin 0.02 Units/min (05/20/21 1100)    arformoterol  15 mcg Nebulization BID   vitamin C  500 mg Per Tube BID   budesonide (PULMICORT) nebulizer solution  0.25 mg Nebulization BID   chlorhexidine gluconate (MEDLINE KIT)  15 mL Mouth Rinse BID   Chlorhexidine Gluconate Cloth  6 each Topical Q0600   feeding supplement (PROSource TF)  45 mL Per Tube TID   free water  30 mL Per Tube Q4H   heparin injection (subcutaneous)  5,000 Units Subcutaneous Q12H   insulin aspart  0-9 Units Subcutaneous Q4H   insulin aspart  7 Units Subcutaneous Q4H   insulin detemir  17 Units Subcutaneous BID   levETIRAcetam  500 mg Per Tube BID   linezolid  600 mg Per Tube Q12H   magic mouthwash w/lidocaine  10 mL Oral QID   mouth rinse  15 mL Mouth Rinse 10 times per day   midodrine  10 mg Per Tube TID WC   multivitamin  1 tablet Per Tube QHS   nutrition supplement (JUVEN)  1 packet Per Tube BID BM   pantoprazole (PROTONIX) IV  40 mg Intravenous Q24H   revefenacin  175 mcg Nebulization Daily   sodium chloride flush  10-40 mL Intracatheter Q12H   zinc sulfate  220 mg Per Tube Daily    Assessment/ Plan:     Principal Problem:   Severe sepsis with septic shock  (CODE) ([place]) Active Problems:   COPD (chronic obstructive pulmonary disease) (HCC)   Morbid obesity ([place])   Diabetes mellitus type 2 in obese [facility])   Atrial fibrillation  and flutter ([place])   Necrotizing fasciitis ([place])   Acute on chronic respiratory failure ([place])   On mechanically assisted ventilation ([place])   Shock liver   Anemia associated with acute blood loss   Metabolic acidosis   Encounter for continuous renal replacement therapy (CRRT) for acute renal failure (HCC)   Acute encephalopathy   Group A streptococcal infection   Streptococcal toxic shock syndrome ([place])   Status post tracheostomy ([place])   Subdural hemorrhage ([place])   Left leg cellulitis  [name] is a 71 y.o. black female with COPD/asthma, diabetes mellitus type II, hypertension, obstructive sleep apnea, paroxysmal atrial fibrillation, GERD, who presents to [facility] on 05/04/2021 for AKI (acute kidney injury) ([place]) [N17.9] Left leg cellulitis [L03.116] Severe sepsis ([place]) [A41.9, R65.20] Severe sepsis with lactic acidosis ([place]) [A41.9, R65.20, E87.20]   #1: Acute kidney injury: AKI is secondary to ischemic nephropathy secondary to hypotension complicated with sepsis. History of bland urine. Baseline creatinine of 0.98, GFR > 60 on 02/11/21. Anuric urine output.  - Continue CRRT with 100 mL of fluid removal every hour.   #2: Sepsis: necrotizing fasciitis for left lower extremity status post multiple debridement. Requiring vasopressors: vasopressin. Continue ceftriaxone.  #3. Anemia with kidney injury: hemoglobin 7.8. Patient may require ESA.  #4. Acute Respiratory Failure: weaning off mechanical ventilation.   Overall prognosis is guarded.  We will continue to monitor closely. Continue CRRT.    LOS: [place], MD [facility] 2/6/202312:00 PM

## 2021-05-20 NOTE — Progress Notes (Signed)
Inpatient Diabetes Program Recommendations  AACE/ADA: New Consensus Statement on Inpatient Glycemic Control   Target Ranges:  Prepandial:   less than 140 mg/dL      Peak postprandial:   less than 180 mg/dL (1-2 hours)      Critically ill patients:  140 - 180 mg/dL    Latest Reference Range & Units 05/20/21 00:03 05/20/21 01:02 05/20/21 03:01 05/20/21 05:21 05/20/21 06:14 05/20/21 06:58 05/20/21 07:59 05/20/21 09:05  Glucose-Capillary 70 - 99 mg/dL 166 (H) 165 (H) 164 (H) 127 (H) 136 (H) 131 (H) 157 (H) 161 (H)    Review of Glycemic Control  Diabetes history: DM2 Outpatient Diabetes medications: Metformin XR 750 mg daily Current orders for Inpatient glycemic control: IV insulin; Vital @ 65 ml/hr  Inpatient Diabetes Program Recommendations:    Insulin: Noted patient was ordered SQ insulin on 05/19/21, received Levemir 29 units at 9:24 am on 05/19/21 and started back on IV insulin at 13:36 on 05/19/21. If provider would like to transition from IV to SQ insulin, would recommend ordering Levemir 17 units BID, CBGs Q4H, Novolog 7 units Q4H for tube feeding coverage, and Novolog 0-9 units Q4H.  NOTE: Patient is currently ordered ICU Phase 2 IV insulin per [employer]. In reviewing chart, noted patient was ordered Hydrocortisone 100 mcg Q12H (last dose 05/19/21 at 3:04 am). Patient was ordered SQ insulin and last received Levemir 29 units at 9:24 am on 05/19/21 and was transitioned to IV insulin at 13:36 on 05/19/21. Per chart, patient remains on vent, CRRT, and tube feeding.   Thanks, Barnie Alderman, RN, MSN, CDE Diabetes Coordinator Inpatient Diabetes Program [phone] (Team Pager from 8am to 5pm)

## 2021-05-20 NOTE — Progress Notes (Signed)
Progress Note  Patient Name: Tamyka Bezio Date of Encounter: 05/20/2021  [facility] Cardiologist: Kathlyn Sacramento, MD   Subjective   The patient is awake and responsive to some questions. Tele shows SR.   Inpatient Medications    Scheduled Meds:  arformoterol  15 mcg Nebulization BID   vitamin C  500 mg Per Tube BID   budesonide (PULMICORT) nebulizer solution  0.25 mg Nebulization BID   chlorhexidine gluconate (MEDLINE KIT)  15 mL Mouth Rinse BID   Chlorhexidine Gluconate Cloth  6 each Topical Q0600   docusate  100 mg Per Tube BID   feeding supplement (PROSource TF)  45 mL Per Tube TID   free water  30 mL Per Tube Q4H   levETIRAcetam  1,000 mg Per Tube BID   linezolid  600 mg Per Tube Q12H   magic mouthwash w/lidocaine  10 mL Oral QID   mouth rinse  15 mL Mouth Rinse 10 times per day   midodrine  10 mg Per Tube TID WC   multivitamin  1 tablet Per Tube QHS   nutrition supplement (JUVEN)  1 packet Per Tube BID BM   pantoprazole (PROTONIX) IV  40 mg Intravenous Q24H   polyethylene glycol  17 g Per Tube Daily   revefenacin  175 mcg Nebulization Daily   sodium chloride flush  10-40 mL Intracatheter Q12H   zinc sulfate  220 mg Per Tube Daily   Continuous Infusions:   prismasol BGK 4/2.5 500 mL/hr at 05/20/21 0806    prismasol BGK 4/2.5 500 mL/hr at 05/20/21 0810   sodium chloride Stopped (05/19/21 2139)   albumin human Stopped (05/19/21 2258)   amiodarone 30 mg/hr (05/20/21 0800)   cefTRIAXone (ROCEPHIN)  IV 2 g (05/19/21 1604)   feeding supplement (VITAL AF 1.2 CAL) Stopped (05/19/21 1945)   insulin 0.5 Units/hr (05/20/21 0800)   phenylephrine (NEO-SYNEPHRINE) Adult infusion Stopped (05/18/21 1428)   prismasol BGK 4/2.5 2,000 mL/hr at 05/20/21 0745   vasopressin 0.03 Units/min (05/20/21 0813)   PRN Meds: artificial tears, dextrose, docusate sodium, fentaNYL (SUBLIMAZE) injection, heparin, lip balm, midazolam, ondansetron (ZOFRAN) IV, oxyCODONE, sennosides,  sodium chloride, sodium chloride flush   Vital Signs    Vitals:   05/20/21 0645 05/20/21 0700 05/20/21 0724 05/20/21 0800  BP: 113/61 (!) 109/58  (!) 103/57  Pulse: 69 68  70  Resp: 19 (!) 21  (!) 23  Temp: (!) 96.6 F (35.9 C) (!) 96.6 F (35.9 C)  (!) 97 F (36.1 C)  TempSrc:    Rectal  SpO2: 100% 100% 100% 100%  Weight:      Height:        Intake/Output Summary (Last 24 hours) at 05/20/2021 0824 Last data filed at 05/20/2021 0800 Gross per 24 hour  Intake 2834.06 ml  Output 5578 ml  Net -2743.94 ml   Last 3 Weights 05/20/2021 05/19/2021 05/18/2021  Weight (lbs) 248 lb 7.3 oz 253 lb 4.9 oz 261 lb 7.5 oz  Weight (kg) 112.7 kg 114.9 kg 118.6 kg      Telemetry    NSR,HR 60-70 - Personally Reviewed  ECG    No new - Personally Reviewed  Physical Exam   GEN: somnolent Neck: s/p tach Cardiac: RRR, no murmurs, rubs, or gallops.  Respiratory: Clear to auscultation bilaterally. GI: Soft, nontender, non-distended  MS: No edema; LLE wound VAC Neuro:  Nonfocal  Psych: unable to assess  Labs    High Sensitivity Troponin:   Recent Labs  Lab 05/15/21 1708 05/15/21 1926  TROPONINIHS 338* 305*     Chemistry Recent Labs  Lab 05/15/21 0313 05/15/21 1708 05/17/21 0350 05/17/21 1604 05/18/21 0305 05/18/21 1614 05/19/21 0420 05/19/21 0421 05/19/21 1606 05/20/21 0549 05/20/21 0550  NA 131*   < > 135   < > 134*   < > 135  --  137 134*  --   K 3.8   < > 4.5   < > 4.4   < > 4.0  --  3.6 4.5  --   CL 98   < > 102   < > 100   < > 102  --  103 100  --   CO2 24   < > 21*   < > 23   < > 22  --  23 24  --   GLUCOSE 228*   < > 103*   < > 197*   < > 282*  --  125* 158*  --   BUN 48*   < > 44*   < > 38*   < > 50*  --  55* 36*  --   CREATININE 1.66*   < > 1.38*   < > 1.12*   < > 1.14*  --  1.13* 0.90  --   CALCIUM 9.2   < > 8.3*   < > 8.3*   < > 8.9  --  9.2 9.0  --   MG 1.9   < > 2.4  --  2.4  --   --  2.7*  --   --  2.5*  PROT 6.6  --  6.2*  --   --   --   --   --   --   --    --   ALBUMIN 2.6*   2.6*   < > 1.9*   1.9*   < > 2.6*   2.4*   < > 2.9*  --  3.3* 3.7  --   AST 639*  --  163*  --   --   --   --   --   --   --   --   ALT 749*  --  326*  --   --   --   --   --   --   --   --   ALKPHOS 328*  --  452*  --   --   --   --   --   --   --   --   BILITOT 5.0*  --  3.6*  --   --   --   --   --   --   --   --   GFRNONAA 33*   < > 41*   < > 52*   < > 51*  --  52* >60  --   ANIONGAP 9   < > 12   < > 11   < > 11  --  11 10  --    < > = values in this interval not displayed.    Lipids  Recent Labs  Lab 05/15/21 0313  TRIG 121    Hematology Recent Labs  Lab 05/18/21 0305 05/19/21 0421 05/20/21 0550  WBC 14.6* 25.9* 25.0*  RBC 2.90* 2.70* 2.73*  HGB 8.3* 7.7* 7.8*  HCT 25.3* 22.6* 22.5*  MCV 87.2 83.7 82.4  MCH 28.6 28.5 28.6  MCHC 32.8 34.1 34.7  RDW 20.7* 20.7* 20.6*  PLT 152 127*  161   Thyroid  Recent Labs  Lab 05/14/21 0410  FREET4 0.48*    BNPNo results for input(s): BNP, PROBNP in the last 168 hours.  DDimer No results for input(s): DDIMER in the last 168 hours.   Radiology    DG Abd 1 View  Result Date: 05/20/2021 CLINICAL DATA:  Vomiting EXAM: ABDOMEN - 1 VIEW COMPARISON:  Abdominal radiograph dated May 17, 2021 FINDINGS: NG tube tip and side port within the stomach. Paucity of bowel gas somewhat limits evaluation. A few nondilated gas-filled loops of bowel are seen in the pelvis. IMPRESSION: Paucity of bowel gas somewhat limits evaluation. A few nondilated gas-filled loops of bowel are seen in the pelvis. Electronically Signed   By: Yetta Glassman M.D.   On: 05/20/2021 08:12   DG Chest Port 1 View  Result Date: 05/20/2021 CLINICAL DATA:  Vomiting EXAM: PORTABLE CHEST 1 VIEW COMPARISON:  05/15/2021 chest radiograph. FINDINGS: Tracheostomy tube tip overlies the tracheal air column at the thoracic inlet. Right internal jugular central venous catheter terminates over the cavoatrial junction. Enteric tube enters stomach with the tip not  seen on this image. Left internal jugular central venous catheter terminates in the upper third of the SVC. Stable cardiomediastinal silhouette with mild cardiomegaly. No pneumothorax. Trace bilateral pleural effusions, similar. Borderline mild pulmonary edema, improved. Improved lung volumes with residual mild left basilar atelectasis. IMPRESSION: 1. Mild position support structures.  No pneumothorax. 2. Borderline mild congestive heart failure, improved. 3. Trace bilateral pleural effusions, similar. 4. Improved lung volumes with residual mild left basilar atelectasis. Electronically Signed   By: Ilona Sorrel M.D.   On: 05/20/2021 08:12    Cardiac Studies   TTE 05/2021 1. Left ventricular ejection fraction, by estimation, is 60 to 65%. The  left ventricle has normal function. The left ventricle has no regional  wall motion abnormalities. There is mild left ventricular hypertrophy.  Left ventricular diastolic parameters  are consistent with Grade I diastolic dysfunction (impaired relaxation).   2. Right ventricular systolic function is normal. The right ventricular  size is normal. There is mildly elevated pulmonary artery systolic  pressure. The estimated right ventricular systolic pressure is 84.6 mmHg.   3. The mitral valve is normal in structure. No evidence of mitral valve  regurgitation. No evidence of mitral stenosis.   4. The aortic valve is normal in structure. Aortic valve regurgitation is  not visualized. No aortic stenosis is present.   5. The inferior vena cava is normal in size with greater than 50%  respiratory variability, suggesting right atrial pressure of 3 mmHg.   Patient Profile     71 y.o. female with history of hypertension, diabetes, obesity, COPD admitted with lower extremity cellulitis, bacteremia and shock.  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  Being seen for A. fib  RVR.  Assessment & Plan    Afib RVR - maintaining NSR - IV amiodarone - transition to oral amiodarone when able - heparin held with anemia. Hgb today 7.8 - Echo showed preserved LVEF  Necrotizing fascitis/bacteremia/Septic chock - s/p multiple debridements of lower left leg - off pressors - s/p IVIG - wound VAC - abx per ICU  Respiratory failure s/p trach - trach/vent management per respiratory/ICU team  AKI on CRRT - nephology following  For questions or updates, please contact [facility] Please consult [email or web address] for contact info under        Signed, Nakaya Mishkin  Ninfa Meeker, PA-C  05/20/2021, 8:24 AM

## 2021-05-20 NOTE — Progress Notes (Signed)
Patient ID: Pamala Hayman, female   DOB: 09-29-1950, 71 y.o.   MRN: [id number]     [facility] Day(s): 16.   Interval History: Patient seen and examined, no acute events or new complaints overnight.  Patient continue critically ill.  She continues on CRRT.  She is on surgical mask.  Seen low-dose vasopressor.  No issues with the wound VAC.  Vital signs in last 24 hours: [min-max] current  Temp:  [96.6 F (35.9 C)-99 F (37.2 C)] 96.6 F (35.9 C) (02/06 1200) Pulse Rate:  [54-84] 70 (02/06 1200) Resp:  [15-27] 21 (02/06 1200) BP: (97-145)/(29-97) 118/50 (02/06 1200) SpO2:  [100 %] 100 % (02/06 1200) FiO2 (%):  [28 %-30 %] 30 % (02/06 1131) Weight:  [112.7 kg] 112.7 kg (02/06 0600)     Height: '5\' 5"'  (165.1 cm) Weight: 112.7 kg BMI (Calculated): 41.35   Physical Exam:  Constitutional: Critically ill Respiratory: breathing non-labored at rest  Cardiovascular: regular rate and sinus rhythm  Gastrointestinal: soft, non-tender, and non-distended Extremity: Wound VAC removed.  Left leg there was some portion of incorporation of the excess oral matrix.  Adequate blood flow to the foot.       Labs:  CBC Latest Ref Rng & Units 05/20/2021 05/19/2021 05/18/2021  WBC 4.0 - 10.5 K/uL 25.0(H) 25.9(H) 14.6(H)  Hemoglobin 12.0 - 15.0 g/dL 7.8(L) 7.7(L) 8.3(L)  Hematocrit 36.0 - 46.0 % 22.5(L) 22.6(L) 25.3(L)  Platelets 150 - 400 K/uL 161 127(L) 152   CMP Latest Ref Rng & Units 05/20/2021 05/19/2021 05/19/2021  Glucose 70 - 99 mg/dL 158(H) 125(H) 282(H)  BUN 8 - 23 mg/dL 36(H) 55(H) 50(H)  Creatinine 0.44 - 1.00 mg/dL 0.90 1.13(H) 1.14(H)  Sodium 135 - 145 mmol/L 134(L) 137 135  Potassium 3.5 - 5.1 mmol/L 4.5 3.6 4.0  Chloride 98 - 111 mmol/L 100 103 102  CO2 22 - 32 mmol/L '24 23 22  ' Calcium 8.9 - 10.3 mg/dL 9.0 9.2 8.9  Total Protein 6.5 - 8.1 g/dL - - -  Total Bilirubin 0.3 - 1.2 mg/dL - - -  Alkaline Phos 38 - 126 U/L - - -  AST 15 - 41 U/L - - -  ALT 0 - 44 U/L - - -     Imaging studies: No new pertinent imaging studies   Assessment/Plan:  71 y.o. female with left leg necrotizing fasciitis with 9 Day Post-Op s/p debridement, and 2-day postop  Myriad matrix placement complicated by pertinent comorbidities including strep pyogenes bacteremia now in septic shock (resolved), respiratory failure mechanical ventilation, liver failure (resolved), acute renal failure on CRRT, COPD, type 2 diabetes mellitus, hypertension, obstructive sleep apnea, GERD   Left leg necrotizing fasciitis -S/p debridement 05/07/2021 -Second debridement on 05/10/2021.  Application of wound VAC system -S/p myriad matrix placement on 05/17/2021 -I personally changed the negative pressure dressing (DME) today. -Patient slowly improving clinically.  Today on tracheal mask.  Still on CRRT -Possible change of negative pressure dressing in 4 days. -I will continue to follow closely.  Arnold Long, MD

## 2021-05-20 NOTE — Consult Note (Signed)
PHARMACY CONSULT NOTE - FOLLOW UP  Pharmacy Consult for Electrolyte Monitoring and Replacement   Recent Labs: Potassium (mmol/L)  Date Value  05/20/2021 4.5  08/11/2013 3.3 (L)   Magnesium (mg/dL)  Date Value  05/20/2021 2.5 (H)   Calcium (mg/dL)  Date Value  05/20/2021 9.0   Calcium, Total (mg/dL)  Date Value  08/11/2013 9.2   Albumin (g/dL)  Date Value  05/20/2021 3.7  08/11/2013 3.1 (L)   Phosphorus (mg/dL)  Date Value  05/20/2021 5.1 (H)   Sodium (mmol/L)  Date Value  05/20/2021 134 (L)  08/11/2013 138   Assessment: 72 yo female with a previous medical history of diabetes mellitus type 2, hypertension, obstructive sleep apnea on CPAP, asthma/COPD, GERD, history of COVID-19 pneumonia presenting with lower left extremity pain and chills. Patient found to have severe sepsis and strep pyogenes bacteremia on admission and is currently intubated and sedated. LLL necrotizing fascitis. [facility]  Pharmacy has been consulted to monitor and replace electrolytes.  -on CRRT -s/p trach 2/3, still on vent  Nutrition:  --Feeds per tube --Free water per tube 30 mL Q4H  Goal of Therapy:  Electrolytes within normal limits  Plan:  --No replacement at this time  --Renal function panels BID while on CRRT  Benita Gutter 05/20/2021 7:29 AM

## 2021-05-20 NOTE — Progress Notes (Signed)
I  Date of Admission:  05/04/2021     ID: Alexandra Foster is a 71 y.o. female  Principal Problem:   Severe sepsis with septic shock (CODE) ([place]) Active Problems:   COPD (chronic obstructive pulmonary disease) ([place])   Morbid obesity ([place])   Diabetes mellitus type 2 in obese ([place])   Atrial fibrillation and flutter (HCC)   Necrotizing fasciitis ([place])   Acute on chronic respiratory failure ([place])   On mechanically assisted ventilation ([place])   Shock liver   Anemia associated with acute blood loss   Metabolic acidosis   Encounter for continuous renal replacement therapy (CRRT) for acute renal failure (HCC)   Acute encephalopathy   Group A streptococcal infection   Streptococcal toxic shock syndrome ([place])   Status post tracheostomy ([place])   Subdural hemorrhage ([place])   Left leg cellulitis    Subjective: Review of system-not available from patient as she has got a tracheostomy and is on vent She opens her eyes on calling her name and follows some commands like squeezing my fingers.  Medications:   arformoterol  15 mcg Nebulization BID   vitamin C  500 mg Per Tube BID   budesonide (PULMICORT) nebulizer solution  0.25 mg Nebulization BID   chlorhexidine gluconate (MEDLINE KIT)  15 mL Mouth Rinse BID   Chlorhexidine Gluconate Cloth  6 each Topical Q0600   feeding supplement (PIVOT 1.5 CAL)  1,000 mL Per Tube Q24H   free water  30 mL Per Tube Q4H   heparin injection (subcutaneous)  5,000 Units Subcutaneous Q12H   insulin aspart  0-9 Units Subcutaneous Q4H   insulin aspart  7 Units Subcutaneous Q4H   insulin detemir  17 Units Subcutaneous BID   levETIRAcetam  500 mg Per Tube BID   magic mouthwash w/lidocaine  10 mL Oral QID   mouth rinse  15 mL Mouth Rinse 10 times per day   midodrine  10 mg Per Tube TID WC   multivitamin  1 tablet Per Tube QHS   nutrition supplement (JUVEN)  1 packet Per Tube BID BM   pantoprazole (PROTONIX) IV  40 mg Intravenous Q24H   revefenacin  175 mcg  Nebulization Daily   sodium chloride flush  10-40 mL Intracatheter Q12H   zinc sulfate  220 mg Per Tube Daily    Objective: Vital signs in last 24 hours: Temp:  [96.4 F (35.8 C)-98.2 F (36.8 C)] 98.2 F (36.8 C) (02/06 1800) Pulse Rate:  [54-81] 81 (02/06 1800) Resp:  [15-27] 23 (02/06 1800) BP: (97-127)/(29-97) 121/53 (02/06 1800) SpO2:  [97 %-100 %] 100 % (02/06 1800) FiO2 (%):  [30 %] 30 % (02/06 1532) Weight:  [112.7 kg] 112.7 kg (02/06 0600)  PHYSICAL EXAM:  General: On calling the name patient opens her eyes.  She does not talk.  But can squeeze my fingers as commanded Not in distress  Head: Normocephalic, without obvious abnormality, atraumatic. Eyes: Conjunctivae clear, anicteric sclerae. Pupils are equal ENT tongue looks like there are  some cuts. But she is unable to stick her tongue out completely Neck: Tracheostomy  no carotid bruit and no JVD.  Lungs: Bilateral air entry  heart: Regular rate and rhythm, no murmur, rub or gallop. Abdomen: Soft, non-tender,not distended. Bowel sounds normal. No masses Extremities: Left leg has wound VAC Picture from today reviewed     Lymph: Cervical, supraclavicular normal. Neurologic: Cannot be assessed Bilateral IJ lines.  On CRRT NG tube on tube feeds Foley catheter  anuria  Lab Results Recent Labs    05/19/21 0421 05/19/21 1606 05/20/21 0549 05/20/21 0550 05/20/21 1630  WBC 25.9*  --   --  25.0*  --   HGB 7.7*  --   --  7.8*  --   HCT 22.6*  --   --  22.5*  --   NA  --    < > 134*  --  133*  K  --    < > 4.5  --  4.7  CL  --    < > 100  --  100  CO2  --    < > 24  --  22  BUN  --    < > 36*  --  44*  CREATININE  --    < > 0.90  --  0.93   < > = values in this interval not displayed.   Liver Panel Recent Labs    05/20/21 0549 05/20/21 1630  ALBUMIN 3.7 3.2*   Sedimentation Rate No results for input(s): ESRSEDRATE in the last 72 hours. C-Reactive Protein No results for input(s): CRP in the  last 72 hours.  Microbiology: [facility] 05/04/21- GAS 05/07/21- Wound culture NG 05/09/21 BC -NG Studies/Results: DG Abd 1 View  Result Date: 05/20/2021 CLINICAL DATA:  Vomiting EXAM: ABDOMEN - 1 VIEW COMPARISON:  Abdominal radiograph dated May 17, 2021 FINDINGS: NG tube tip and side port within the stomach. Paucity of bowel gas somewhat limits evaluation. A few nondilated gas-filled loops of bowel are seen in the pelvis. IMPRESSION: Paucity of bowel gas somewhat limits evaluation. A few nondilated gas-filled loops of bowel are seen in the pelvis. Electronically Signed   By: Yetta Glassman M.D.   On: 05/20/2021 08:12   DG Chest Port 1 View  Result Date: 05/20/2021 CLINICAL DATA:  Vomiting EXAM: PORTABLE CHEST 1 VIEW COMPARISON:  05/15/2021 chest radiograph. FINDINGS: Tracheostomy tube tip overlies the tracheal air column at the thoracic inlet. Right internal jugular central venous catheter terminates over the cavoatrial junction. Enteric tube enters stomach with the tip not seen on this image. Left internal jugular central venous catheter terminates in the upper third of the SVC. Stable cardiomediastinal silhouette with mild cardiomegaly. No pneumothorax. Trace bilateral pleural effusions, similar. Borderline mild pulmonary edema, improved. Improved lung volumes with residual mild left basilar atelectasis. IMPRESSION: 1. Mild position support structures.  No pneumothorax. 2. Borderline mild congestive heart failure, improved. 3. Trace bilateral pleural effusions, similar. 4. Improved lung volumes with residual mild left basilar atelectasis. Electronically Signed   By: Ilona Sorrel M.D.   On: 05/20/2021 08:12     Assessment/Plan:  Group A streptococcus bacteremia with invasive infection with septic shock, multiorgan failure.  Necrotizing infection of the left leg.  Status post debridement.  Has wound VAC Patient patient is on day 15 of appropriate IV antibiotics.  Currently on ceftriaxone and linezolid.   Will discontinue linezolid.  She also received 3 doses of IVIG.  Leukocytosis.  Continue.  WBC 25 K.  Wonder whether it is because of the tongue which is kind of looking infected Will discontinue ceftriaxone and put her on Unasyn.   Encephalopathy: Improved Seizures on Keppra Small subdural hemorrhage.  Normal MRA  AKI on CRRT  Acute hypoxic respiratory failure.  Tracheostomy and is on the vent  Shock liver with transaminitis improving  Anemia due to infection and blood loss from the left leg.  Has received multiple units of PRBC  A-fib now in sinus rhythm.  On amiodarone.  Followed by cardiology.  Anasarca.  Getting albumin and CRRT  Discussed management with the care team.

## 2021-05-20 NOTE — TOC Progression Note (Signed)
Transition of Care [facility]) - Progression Note    Patient Details  Name: Alexandra Foster MRN: [id number] Date of Birth: 29-Mar-1951  Transition of [facility]) CM/SW Contact  Shelbie Hutching, RN Phone Number: 05/20/2021, 12:09 PM  Clinical Narrative:    TOC continues to follow patient progress.  Patient still on CRRT, tolerating trach collar this morning.  Surgery did a bedside dressing change today.   TOC will cont to follow.   Expected Discharge Plan: [place] Barriers to Discharge: Continued Medical Work up  Expected Discharge Plan and Services Expected Discharge Plan: [place]   Discharge Planning Services: CM Consult   Living arrangements for the past 2 months: Single Family Home                                       Social Determinants of Health (SDOH) Interventions    Readmission Risk Interventions No flowsheet data found.

## 2021-05-20 NOTE — Progress Notes (Signed)
Contacted neurosurgeon Dr. Izora Ribas via secure chat in epic regarding restarting prophylactic anticoagulation given recent acute SAH findings on CT Head.  Dr. Izora Ribas sent message stating it was ok to start prophylactic anticoagulation given pts risk for developing blood clots.  Therefore, subcutaneous heparin restarted @5 ,000 units bid. Will continue to monitor and assess pt  Rosilyn Mings, [place] Pager [phone] (please enter 7 digits) PCCM Consult Pager [phone] (please enter 7 digits)

## 2021-05-21 ENCOUNTER — Inpatient Hospital Stay: Payer: Medicare HMO

## 2021-05-21 DIAGNOSIS — D72829 Elevated white blood cell count, unspecified: Secondary | ICD-10-CM

## 2021-05-21 DIAGNOSIS — B950 Streptococcus, group A, as the cause of diseases classified elsewhere: Secondary | ICD-10-CM | POA: Diagnosis not present

## 2021-05-21 DIAGNOSIS — I4891 Unspecified atrial fibrillation: Secondary | ICD-10-CM | POA: Diagnosis not present

## 2021-05-21 DIAGNOSIS — I4892 Unspecified atrial flutter: Secondary | ICD-10-CM | POA: Diagnosis not present

## 2021-05-21 DIAGNOSIS — R7881 Bacteremia: Secondary | ICD-10-CM

## 2021-05-21 DIAGNOSIS — N179 Acute kidney failure, unspecified: Secondary | ICD-10-CM | POA: Diagnosis not present

## 2021-05-21 LAB — CBC WITH DIFFERENTIAL/PLATELET
Abs Immature Granulocytes: 2.41 10*3/uL — ABNORMAL HIGH (ref 0.00–0.07)
Basophils Absolute: 0.2 10*3/uL — ABNORMAL HIGH (ref 0.0–0.1)
Basophils Relative: 1 %
Eosinophils Absolute: 0.3 10*3/uL (ref 0.0–0.5)
Eosinophils Relative: 2 %
HCT: 22.5 % — ABNORMAL LOW (ref 36.0–46.0)
Hemoglobin: 7.4 g/dL — ABNORMAL LOW (ref 12.0–15.0)
Immature Granulocytes: 11 %
Lymphocytes Relative: 7 %
Lymphs Abs: 1.6 10*3/uL (ref 0.7–4.0)
MCH: 28.7 pg (ref 26.0–34.0)
MCHC: 32.9 g/dL (ref 30.0–36.0)
MCV: 87.2 fL (ref 80.0–100.0)
Monocytes Absolute: 0.7 10*3/uL (ref 0.1–1.0)
Monocytes Relative: 3 %
Neutro Abs: 17.5 10*3/uL — ABNORMAL HIGH (ref 1.7–7.7)
Neutrophils Relative %: 76 %
Platelets: 166 10*3/uL (ref 150–400)
RBC: 2.58 MIL/uL — ABNORMAL LOW (ref 3.87–5.11)
RDW: 20.6 % — ABNORMAL HIGH (ref 11.5–15.5)
Smear Review: NORMAL
WBC: 22.7 10*3/uL — ABNORMAL HIGH (ref 4.0–10.5)
nRBC: 48.4 % — ABNORMAL HIGH (ref 0.0–0.2)

## 2021-05-21 LAB — RENAL FUNCTION PANEL
Albumin: 2.9 g/dL — ABNORMAL LOW (ref 3.5–5.0)
Anion gap: 11 (ref 5–15)
BUN: 63 mg/dL — ABNORMAL HIGH (ref 8–23)
CO2: 22 mmol/L (ref 22–32)
Calcium: 9.2 mg/dL (ref 8.9–10.3)
Chloride: 101 mmol/L (ref 98–111)
Creatinine, Ser: 1.69 mg/dL — ABNORMAL HIGH (ref 0.44–1.00)
GFR, Estimated: 32 mL/min — ABNORMAL LOW (ref >60)
Glucose, Bld: 134 mg/dL — ABNORMAL HIGH (ref 70–99)
Phosphorus: 2.8 mg/dL (ref 2.5–4.6)
Potassium: 4.6 mmol/L (ref 3.5–5.1)
Sodium: 134 mmol/L — ABNORMAL LOW (ref 135–145)

## 2021-05-21 LAB — HEPATITIS B SURFACE ANTIBODY,QUALITATIVE: Hep B S Ab: REACTIVE — AB

## 2021-05-21 LAB — MAGNESIUM: Magnesium: 2.6 mg/dL — ABNORMAL HIGH (ref 1.7–2.4)

## 2021-05-21 LAB — GLUCOSE, CAPILLARY
Glucose-Capillary: 131 mg/dL — ABNORMAL HIGH (ref 70–99)
Glucose-Capillary: 108 mg/dL — ABNORMAL HIGH (ref 70–99)
Glucose-Capillary: 149 mg/dL — ABNORMAL HIGH (ref 70–99)
Glucose-Capillary: 182 mg/dL — ABNORMAL HIGH (ref 70–99)
Glucose-Capillary: 158 mg/dL — ABNORMAL HIGH (ref 70–99)
Glucose-Capillary: 148 mg/dL — ABNORMAL HIGH (ref 70–99)

## 2021-05-21 LAB — HEPATITIS B SURFACE ANTIGEN: Hepatitis B Surface Ag: NONREACTIVE

## 2021-05-21 MED ORDER — PIPERACILLIN-TAZOBACTAM 3.375 G IVPB
3.3750 g | Freq: Three times a day (TID) | INTRAVENOUS | Status: AC
  Administered 2021-05-21 – 2021-05-31 (×31): 3.375 g via INTRAVENOUS
  Filled 2021-05-21 (×31): qty 50

## 2021-05-21 MED ORDER — SODIUM CHLORIDE 0.9 % IV SOLN
3.0000 g | Freq: Two times a day (BID) | INTRAVENOUS | Status: DC
  Filled 2021-05-21 (×2): qty 8

## 2021-05-21 MED ORDER — HEPARIN SODIUM (PORCINE) 1000 UNIT/ML IJ SOLN
INTRAMUSCULAR | Status: AC
  Administered 2021-05-21: 2600 [IU] via INTRAVENOUS_CENTRAL
  Filled 2021-05-21: qty 10

## 2021-05-21 MED ORDER — POVIDONE-IODINE 5 % EX SOLN
Freq: Every day | CUTANEOUS | Status: DC
  Filled 2021-05-21 (×2): qty 88.7

## 2021-05-21 MED ORDER — PIVOT 1.5 CAL PO LIQD
1000.0000 mL | ORAL | Status: AC
  Administered 2021-05-21 (×3): 1000 mL

## 2021-05-21 MED ORDER — INSULIN ASPART 100 UNIT/ML IJ SOLN
4.0000 [IU] | INTRAMUSCULAR | Status: DC
  Administered 2021-05-21 – 2021-05-31 (×28): 4 [IU] via SUBCUTANEOUS
  Filled 2021-05-21 (×29): qty 1

## 2021-05-21 MED ORDER — AMIODARONE HCL 200 MG PO TABS
200.0000 mg | ORAL_TABLET | Freq: Every day | ORAL | Status: DC
  Administered 2021-05-21 – 2021-06-01 (×9): 200 mg
  Filled 2021-05-21 (×9): qty 1

## 2021-05-21 MED ORDER — NOREPINEPHRINE 16 MG/250ML-% IV SOLN
0.0000 ug/min | INTRAVENOUS | Status: DC
  Administered 2021-05-21: 12:00:00 2 ug/min via INTRAVENOUS
  Administered 2021-05-23: 13 ug/min via INTRAVENOUS
  Administered 2021-05-24: 14:00:00 10 ug/min via INTRAVENOUS
  Administered 2021-05-25: 25 ug/min via INTRAVENOUS
  Administered 2021-05-25: 21:00:00 14 ug/min via INTRAVENOUS
  Administered 2021-05-27: 21:00:00 6 ug/min via INTRAVENOUS
  Filled 2021-05-21 (×9): qty 250

## 2021-05-21 MED ORDER — EPOETIN ALFA 10000 UNIT/ML IJ SOLN
10000.0000 [IU] | Freq: Once | INTRAMUSCULAR | Status: AC
  Administered 2021-05-21: 10000 [IU] via INTRAVENOUS
  Filled 2021-05-21: qty 1

## 2021-05-21 NOTE — Progress Notes (Signed)
NAME:  Alexandra Foster, MRN:  [id number], DOB:  December 02, 1950, LOS: 19 ADMISSION DATE:  05/04/2021  History of present Illness:  71 y.o female with medical history significant of Asthma/COPD, T2DM, HTN, OSA on CPAP, GERD, COVID-19 pneumonia, and Bilateral lower extremity edema who presented to the ED with chief complaint of LLE pain and chills since Thursday.  Patient was given 30 cc/kg of fluids and started on broad-spectrum antibiotics for sepsis with septic shock.   Admitted to the ICU with severe sepsis with shock secondary to cellulitis of LLE septic shock with LLL necrotizing fascitis post op resp failure and severe septic shock with progressive renal failure on CRRT and progressive multiorgan failure  MRI/CT SHOWS SMALL SAH  -05/20/21- Patient is improved she is able to follow communication verbally and move extermities. She is on 5L/min Trache collar. We have stopped levophed and working on reducing vasopressin. She remains on CRRT.  Insulin infusion is ongoing and plan to have weight based regimen initiated. Additionally she will have SLP for vocalization and PMV trial as well as ROM training with OT/PT.  Multiple specialists on case appreciate everyone involved.   05/21/21-patient is improved mildly , she's off CRRT, shes off insulin drip. She is mentating.  She was evaluated by [facility].  She was unable to vocalize with PMV.  She may need PEG tube for nourishment.   Past Medical History     Arthritis   Asthma   COPD (chronic obstructive pulmonary disease) ([place])   Diabetes mellitus without complication ([place])   Endometriosis   GERD (gastroesophageal reflux disease)   Hypertension   Obesity   Sleep apnea    CPAP    Significant Hospital Events   05/04/21: Admitted to the ICU with severe sepsis with shock secondary to cellulitis of LLE 1/23 off pressors +Afib with RVR 1/24 blood pressure low  1/24 to OR for fasciotomy 1/25 post op resp failure with severe septic shock 1/26  remains on pressors 1/27 returns to the OR today for debridement, wound VAC 1/28 overnight with hemodynamic deterioration, required reintubation, currently on pressors 1/29 remains on 3 pressors, CRRT, wound VAC and minimal sedation.  Awakens to voice and touch.  Seen by vascular surgery and awaiting input from general surgery regarding amputation of left leg 1/30 remains on vent remains on pressors 1/31 remains on pressors, on vent, on CRRT 2/1 remains on pressors, on CRRT, MRI/CT shows SAH, afib with RVR, ECHO pending, restarted AMIO at 33 2/2 remains on vent plan for trach tomorrow 2/3 remains on crrt, remains on vent, plan for [facility] and wound vac change in OR 2/3 S/P TRACH,  Implantation of cellular matrix on left leg 2/4 REMAINS ON PRESSORS, REMAINS ON VENT, REMAINS ON CRRT 2/5 REMAINS ON VENT, OFF PRESSORS, REMAINS ON CRRT  Significant Diagnostic Tests:  1/21: Chest Xray> no active cardiopulmonary process 1/21: CT left lower leg>Diffuse subcutaneous edema may represent cellulitis. No drainable fluid collection/abscess 1/21: Ultrasound lower unilateral left> no DVT   Micro Data:  1/21: SARS-CoV-2 PCR> negative 1/21: Influenza PCR> negative 1/21: Blood culture x2> Streptococcus pyogenes 1/21: Urine Culture> negative 1/21: MRSA PCR>> negative   Antimicrobials:  Vancomycin 1/21> off Cefepime 1/21> off Ceftriaxone 1/21 >> off Penicilli G 1/24>> Linezolid 1/25>>  *Antimicrobials per ID    Interim History / Subjective:  Patient with acute necrotizing fasciitis with emergent fasciotomy Implantation of cellular matrix on left leg OFF PRESSORS On CRRT REMAINS CRITICALLY ILL REMAINS ON VENT S/P TRACH  WEAN  OFF SEDATION  Trache collar on 5L/min     Objective   Blood pressure (!) 112/54, pulse 81, temperature 100 F (37.8 C), resp. rate (!) 22, height _0  (1.651 m), weight 117.1 kg, SpO2 100 %.    Vent Mode: PSV;CPAP FiO2 (%):  [28 %-30 %] 28 % PEEP:  [5 cmH20] 5  cmH20 Pressure Support:  [5 cmH20] 5 cmH20   Intake/Output Summary (Last 24 hours) at 05/21/2021 1334 Last data filed at 05/21/2021 1211 Gross per 24 hour  Intake 1770.36 ml  Output 900 ml  Net 870.36 ml    Filed Weights   05/19/21 0600 05/20/21 0600 05/21/21 0413  Weight: 114.9 kg 112.7 kg 117.1 kg    REVIEW OF SYSTEMS  PATIENT IS UNABLE TO PROVIDE COMPLETE REVIEW OF SYSTEMS DUE TO SEVERE CRITICAL ILLNESS AND TOXIC METABOLIC ENCEPHALOPATHY    PHYSICAL EXAMINATION:  GENERAL:critically ill appearing, +resp distress EYES: Pupils equal, round, reactive to light.  No scleral icterus.  MOUTH: Moist mucosal membrane. INTUBATED NECK: Supple.  PULMONARY: Mild rhonchi bilaterally  CARDIOVASCULAR: S1 and S2.  No murmurs  GASTROINTESTINAL: Soft, nontender, -distended. Positive bowel sounds.  MUSCULOSKELETAL: No swelling, clubbing, or edema.  NEUROLOGIC: GCS8T MUSCULOSKELETAL: Left lower extremity with wound VAC in place, surgery redressing.  Status post fasciotomy     Assessment & Plan    Admitted to the ICU with severe sepsis with shock secondary to cellulitis of LLE septic shock with LLL necrotizing fascitis post op resp failure and severe septic shock with progressive renal failure on CRRT and progressive multiorgan failure   Severe ACUTE Hypoxic and Hypercapnic Respiratory Failure       ETIOLOGY IS SEPTIC SHOCK WITH ATELECTASIS, CHF PULMONARY EDEMA AND PLEURAL EFFUSIONS ALL PRESENT ON ADMISSION -continue Mechanical Ventilator support -Wean Fio2 and PEEP as tolerated -VAP/VENT bundle implementation - Wean PEEP & FiO2 as tolerated, maintain SpO2 > 88% - Head of bed elevated 30 degrees, VAP protocol in place - Plateau pressures less than 30 cm H20  - Intermittent chest x-ray & ABG PRN - Ensure adequate pulmonary hygiene  S/P TRACH WEAN TO PS MODE CONSIDER TCT -SLP and PT/OT ordered -05/20/21  SEPTIC shock SOURCE-LLE-present on admission  Refractory Septic Shock secondary  to LLE necrotizing fasciitis Streptococcal toxic shock syndrome use vasopressors to keep MAP>65 as needed -continue antibiotics as prescribed-zyvox and rocephin IV - Status postdebridement, wound VAC per surgery -Vascular and general surgery following S/p IVIG   ACUTE KIDNEY INJURY/Renal Failure -continue Foley Catheter-assess need -Avoid nephrotoxic agents -Follow urine output, BMP -Ensure adequate renal perfusion, optimize oxygenation -Renal dose medications CRRT ongoing - GFR is now normal while being dialyzed  Intake/Output Summary (Last 24 hours) at 05/21/2021 1334 Last data filed at 05/21/2021 1211 Gross per 24 hour  Intake 1770.36 ml  Output 900 ml  Net 870.36 ml    SHOCK LIVER     - S/P supportive care for sepsis with improved liver enzymes and resolution of transaminitis     - resolved    Severe electrolyte derrangements     - pharmacy consultation       -improved   Acute encephalopathy  Toxic metabolic encephalopathy Loaded with keppra; continue at 1gram bid MRI/CT HEAD SHOWS SMALL VOLUME SAH APPRECIATE NEUROSURGERY RECS -Neurology consulted NO ANTICOAGULATION FOR  1 MONTH AND THEN NEED TO RE-ASSESS NO ANTIPLATELET AGENTS FOR 1 MONTH AND THEN NEED TO RE-ASSESS CAN POSSIBLY CONSIDER BABY ASA BUT WOULD NEED TO PROCEED WITH CAUTION CAN CONSIDER  DVT PROPHYLAXIS  AFTER SURGERY BASED ON RISK OF BLEEDING   ENDO - ICU hypoglycemic\Hyperglycemia protocol -check FSBS per protocol   GI GI PROPHYLAXIS as indicated  NUTRITIONAL STATUS DIET-->TF's as tolerated Constipation protocol as indicated   ELECTROLYTES -follow labs as needed -replace as needed -pharmacy consultation and following  ENDO - ICU hypoglycemic\Hyperglycemia protocol -check FSBS per protocol   Best practice:  Diet: NPO Pain/Anxiety/Delirium protocol (if indicated): Yes, initiated VAP protocol (if indicated): Yes, initiated DVT prophylaxis: Subcutaneous Heparin GI prophylaxis:  PPI Glucose control: ICU hyperglycemia protocol, insulin drip discontinued; SQ insulin Central venous access:  Rt IJV Arterial line:  N/A Foley:  Yes Mobility:  bed rest  PT consulted: N/A Last date of multidisciplinary goals of care discussion [1/27] Code Status:  full code Disposition: ICU    Labs   CBC: Recent Labs  Lab 05/15/21 0313 05/16/21 0422 05/17/21 0350 05/18/21 0305 05/19/21 0421 05/20/21 0550 05/21/21 0402  WBC 24.5*   < > 11.4* 14.6* 25.9* 25.0* 22.7*  NEUTROABS 22.7*  --   --   --  18.9* 19.4* 17.5*  HGB 8.7*   < > 9.2* 8.3* 7.7* 7.8* 7.4*  HCT 26.5*   < > 28.0* 25.3* 22.6* 22.5* 22.5*  MCV 88.0   < > 87.5 87.2 83.7 82.4 87.2  PLT 122*   < > 156 152 127* 161 166   < > = values in this interval not displayed.     Basic Metabolic Panel: Recent Labs  Lab 05/17/21 0350 05/17/21 1604 05/18/21 0305 05/18/21 1614 05/19/21 0420 05/19/21 0421 05/19/21 1606 05/20/21 0549 05/20/21 0550 05/20/21 1630 05/21/21 0402  NA 135   < > 134*   < > 135  --  137 134*  --  133* 134*  K 4.5   < > 4.4   < > 4.0  --  3.6 4.5  --  4.7 4.6  CL 102   < > 100   < > 102  --  103 100  --  100 101  CO2 21*   < > 23   < > 22  --  23 24  --  22 22  GLUCOSE 103*   < > 197*   < > 282*  --  125* 158*  --  238* 134*  BUN 44*   < > 38*   < > 50*  --  55* 36*  --  44* 63*  CREATININE 1.38*   < > 1.12*   < > 1.14*  --  1.13* 0.90  --  0.93 1.69*  CALCIUM 8.3*   < > 8.3*   < > 8.9  --  9.2 9.0  --  9.1 9.2  MG 2.4  --  2.4  --   --  2.7*  --   --  2.5*  --  2.6*  PHOS 3.6   < > 3.4   < > 3.0  --  1.5* 5.1*  --  2.5 2.8   < > = values in this interval not displayed.    GFR: Estimated Creatinine Clearance: 38.5 mL/min (A) (by C-G formula based on SCr of 1.69 mg/dL (H)). Recent Labs  Lab 05/18/21 0305 05/19/21 0421 05/20/21 0550 05/21/21 0402  WBC 14.6* 25.9* 25.0* 22.7*     Liver Function Tests: Recent Labs  Lab 05/15/21 0313 05/15/21 1708 05/17/21 0350 05/17/21 1604  05/19/21 0420 05/19/21 1606 05/20/21 0549 05/20/21 1630 05/21/21 0402  AST 639*  --  163*  --   --   --   --   --   --  ALT 749*  --  326*  --   --   --   --   --   --   ALKPHOS 328*  --  452*  --   --   --   --   --   --   BILITOT 5.0*  --  3.6*  --   --   --   --   --   --   PROT 6.6  --  6.2*  --   --   --   --   --   --   ALBUMIN 2.6*   2.6*   < > 1.9*   1.9*   < > 2.9* 3.3* 3.7 3.2* 2.9*   < > = values in this interval not displayed.    Coagulation Profile: Recent Labs  Lab 05/16/21 0422 05/17/21 0350  INR 1.3* 1.3*     Cardiac Enzymes: No results for input(s): CKTOTAL, CKMB, CKMBINDEX, TROPONINI in the last 168 hours.   HbA1C: Hgb A1c MFr Bld  Date/Time Value Ref Range Status  05/05/2021 04:53 AM 6.7 (H) 4.8 - 5.6 % Final    Comment:    (NOTE)         Prediabetes: 5.7 - 6.4         Diabetes: >6.4         Glycemic control for adults with diabetes: <7.0   02/11/2021 09:41 AM 6.3 (H) <5.7 % of total Hgb Final    Comment:    For someone without known diabetes, a hemoglobin  A1c value between 5.7% and 6.4% is consistent with prediabetes and should be confirmed with a  follow-up test. . For someone with known diabetes, a value <7% indicates that their diabetes is well controlled. A1c targets should be individualized based on duration of diabetes, age, comorbid conditions, and other considerations. . This assay result is consistent with an increased risk of diabetes. . Currently, no consensus exists regarding use of hemoglobin A1c for diagnosis of diabetes for children. .     CBG: Recent Labs  Lab 05/20/21 1908 05/20/21 2320 05/21/21 0401 05/21/21 0722 05/21/21 1112  GLUCAP 182* 156* 131* 108* 149*    No Known Allergies   Medications  Scheduled Meds:  amiodarone  200 mg Per Tube Daily   arformoterol  15 mcg Nebulization BID   vitamin C  500 mg Per Tube BID   budesonide (PULMICORT) nebulizer solution  0.25 mg Nebulization BID    chlorhexidine gluconate (MEDLINE KIT)  15 mL Mouth Rinse BID   Chlorhexidine Gluconate Cloth  6 each Topical Q0600   free water  30 mL Per Tube Q4H   heparin injection (subcutaneous)  5,000 Units Subcutaneous Q12H   insulin aspart  0-9 Units Subcutaneous Q4H   insulin aspart  4 Units Subcutaneous Q4H   insulin detemir  17 Units Subcutaneous BID   magic mouthwash w/lidocaine  10 mL Oral QID   mouth rinse  15 mL Mouth Rinse 10 times per day   midodrine  10 mg Per Tube TID WC   multivitamin  1 tablet Per Tube QHS   nutrition supplement (JUVEN)  1 packet Per Tube BID BM   pantoprazole (PROTONIX) IV  40 mg Intravenous Q24H   revefenacin  175 mcg Nebulization Daily   sodium chloride flush  10-40 mL Intracatheter Q12H   zinc sulfate  220 mg Per Tube Daily   Continuous Infusions:  sodium chloride 250 mL (05/20/21 1427)   ampicillin-sulbactam (UNASYN) IV  erythromycin Stopped (05/21/21 0734)   feeding supplement (PIVOT 1.5 CAL) 1,000 mL (05/21/21 1200)   norepinephrine (LEVOPHED) Adult infusion 6 mcg/min (05/21/21 1211)   PRN Meds:.acetaminophen, artificial tears, docusate, fentaNYL (SUBLIMAZE) injection, lip balm, midazolam, ondansetron (ZOFRAN) IV, oxyCODONE, sennosides, sodium chloride flush    Critical care provider statement:   Total critical care time: 33 minutes   Performed by: Lanney Gins MD   Critical care time was exclusive of separately billable procedures and treating other patients.   Critical care was necessary to treat or prevent imminent or life-threatening deterioration.   Critical care was time spent personally by me on the following activities: development of treatment plan with patient and/or surrogate as well as nursing, discussions with consultants, evaluation of patient's response to treatment, examination of patient, obtaining history from patient or surrogate, ordering and performing treatments and interventions, ordering and review of laboratory studies,  ordering and review of radiographic studies, pulse oximetry and re-evaluation of patient's condition.    Ottie Glazier, M.D.  Pulmonary & Critical Care Medicine

## 2021-05-21 NOTE — Consult Note (Addendum)
PHARMACY CONSULT NOTE - FOLLOW UP  Pharmacy Consult for Electrolyte Monitoring and Replacement   Recent Labs: Potassium (mmol/L)  Date Value  05/21/2021 4.6  08/11/2013 3.3 (L)   Magnesium (mg/dL)  Date Value  05/21/2021 2.6 (H)   Calcium (mg/dL)  Date Value  05/21/2021 9.2   Calcium, Total (mg/dL)  Date Value  08/11/2013 9.2   Albumin (g/dL)  Date Value  05/21/2021 2.9 (L)  08/11/2013 3.1 (L)   Phosphorus (mg/dL)  Date Value  05/21/2021 2.8   Sodium (mmol/L)  Date Value  05/21/2021 134 (L)  08/11/2013 138   Assessment: 71 yo female with a previous medical history of diabetes mellitus type 2, hypertension, obstructive sleep apnea on CPAP, asthma/COPD, GERD, history of COVID-19 pneumonia presenting with lower left extremity pain and chills. Patient found to have severe sepsis and strep pyogenes bacteremia on admission and is currently intubated and sedated. LLL necrotizing fascitis. [facility]  Pharmacy has been consulted to monitor and replace electrolytes.  -CRRT stopped 2/6 -s/p trach 2/3, tolerating trach collar trials  Nutrition:  --Feeds per tube --Free water per tube 30 mL Q4H  Goal of Therapy:  Electrolytes within normal limits  Plan:  --No replacement at this time  --Follow-up electrolytes with AM labs tomorrow  Benita Gutter 05/21/2021 7:35 AM

## 2021-05-21 NOTE — Progress Notes (Signed)
Hemodialysis notes  17:32 Pt started HD treatment. CVC ports blood returned noticed but with slight resistance. Lines reversed because arterial port has more resistance than venous port. Started at BFR of 250 as ordered but after 45 minutes, Arterial Pressure keeps alarming. BFR lowered to 230. Monitor pt closely. Towards the last 30 minutes, it cannot tolerate the 230 BFR. BFR lowered again to 200.  Dr Juleen China was notified.  19:39 HD completed with zero UF net removal.

## 2021-05-21 NOTE — Progress Notes (Signed)
Inpatient Diabetes Program Recommendations  AACE/ADA: New Consensus Statement on Inpatient Glycemic Control   Target Ranges:  Prepandial:   less than 140 mg/dL      Peak postprandial:   less than 180 mg/dL (1-2 hours)      Critically ill patients:  140 - 180 mg/dL    Latest Reference Range & Units 05/21/21 04:01 05/21/21 07:22  Glucose-Capillary 70 - 99 mg/dL 131 (H) 108 (H)    Latest Reference Range & Units 05/20/21 10:07 05/20/21 11:17 05/20/21 12:18 05/20/21 13:18 05/20/21 16:11 05/20/21 19:08 05/20/21 23:20  Glucose-Capillary 70 - 99 mg/dL 162 (H) 147 (H) 165 (H) 175 (H) 217 (H) 182 (H) 156 (H)   Review of Glycemic Control  Diabetes history: DM2 Outpatient Diabetes medications: Metformin XR 750 mg daily Current orders for Inpatient glycemic control: Levemir 17 units BID, Novolog 7 units Q4H, Novolog 0-9 units Q4H; Pivot @ 20 ml/hr  Inpatient Diabetes Program Recommendations:    Insulin: Please consider decreasing Novolog tube feeding coverage to Novolog 4 units Q4H.   NOTE: Per RD note on 05/20/21 tube feeding was changed, "Change to Pivot 1.5@65ml /hr- Initiate at 67ml/hr, once pt is tolerating, increase by 81ml/hr q 8 hours until goal rate is reached."  Thanks, Barnie Alderman, RN, MSN, CDE Diabetes Coordinator Inpatient Diabetes Program [phone] (Team Pager from 8am to 5pm)

## 2021-05-21 NOTE — Evaluation (Signed)
[facility] - Evaluation Patient Details  Name: Alexandra Foster MRN: [id number] Date of Birth: [birth year]  Today's Date: 05/21/2021 Time: 0952-1100 SLP Time Calculation (min) (ACUTE ONLY): 68 min  Past Medical History:  Past Medical History:  Diagnosis Date   AKI (acute kidney injury) (HCC)    a. 04/2021 in setting of bacteremia/shock.   Arthritis    Asthma    Bacteremia    a. 04/2021 S pyogenes bacteremia in setting of lower ext cellulitis.   COPD (chronic obstructive pulmonary disease) (HCC)    Diabetes mellitus without complication (HCC)    Endometriosis    GERD (gastroesophageal reflux disease)    History of echocardiogram    a. 07/2013 Echo: EF 55-60%, impaired relaxation, mild TR; b. 04/2021 Echo: EF 50-55%, mild LVH, nl RV fxn, mild BAE, Ao sclerosis w/o stenosis.   Hypertension    Obesity    PAF (paroxysmal atrial fibrillation) (HCC)    a. 04/2021 in setting of septic shock/cellulitis.   Sleep apnea    CPAP   Past Surgical History:  Past Surgical History:  Procedure Laterality Date   ABDOMINAL HYSTERECTOMY     APPLICATION OF WOUND VAC  05/10/2021   Procedure: APPLICATION OF WOUND VAC;  Surgeon: Carolan Shiver, MD;  Location: [facility] ORS;  Service: General;;   APPLICATION OF WOUND VAC Left 05/17/2021   Procedure: APPLICATION OF WOUND VAC/WOUND VAC EXCHANGE-[employer];  Surgeon: Carolan Shiver, MD;  Location: [facility] ORS;  Service: General;  Laterality: Left;   COLONOSCOPY  10/29/2006   Dr Servando Snare   COLONOSCOPY WITH PROPOFOL N/A 11/05/2016   Procedure: COLONOSCOPY WITH PROPOFOL;  Surgeon: Earline Mayotte, MD;  Location: [facility] END[facility]COPY;  Service: Endoscopy;  Laterality: N/A;   INCISION AND DRAINAGE OF WOUND Left 05/10/2021   Procedure: IRRIGATION AND DEBRIDEMENT LEFT LEG;  Surgeon: Carolan Shiver, MD;  Location: [facility] ORS;  Service: General;  Laterality: Left;   NASAL SINUS SURGERY  2002   Dr Chestine Spore   TRACHEOSTOMY TUBE PLACEMENT N/A 05/17/2021    Procedure: TRACHEOSTOMY;  Surgeon: Geanie Logan, MD;  Location: [facility] ORS;  Service: ENT;  Laterality: N/A;   WOUND DEBRIDEMENT Left 05/07/2021   Procedure: DEBRIDEMENT WOUND;  Surgeon: Carolan Shiver, MD;  Location: [facility] ORS;  Service: General;  Laterality: Left;   HPI:  Pt is a 71 y.o female admitted to the ED on 05/04/2021 with a history of asthma, COPD, type 2 diabetes, hypertension, obstructive sleep apnea, GERD presenting to the ER with left lower extremity pain and fevers for about a week.  She is found to be in septic shock and admitted to the ICU.  She is currently undergoing treatment for left lower extremity cellulitis with necrotizing fasciitis status post debridement on 05/07/21.  She is currently in multisystem organ failure on CRRT.   Neurosurgery was consulted due to progressive cognitive decline and head CT showing small volume subarachnoid hemorrhage in the setting of a poor neurologic exam.   CXR on 05/20/2021: Mild position support structures.  No pneumothorax.  2. Borderline mild congestive heart failure, improved.  3. Trace bilateral pleural effusions, similar.  4. Improved lung volumes with residual mild left basilar  atelectasis.  Head CT/MRI on 05/14/2021: Hyperintensity left middle frontal sulcus compatible with acute  subarachnoid hemorrhage, small volume.     No other acute intracranial abnormality. No hydrocephalus or  infarct.    Assessment / Plan / Recommendation  Clinical Impression  Pt seen for PMV evaluation for toleration of PMV wear/use for verbal  communication w/ others and to promote Pulmonary strengthening. Pt continues on TC wean today per RT/NSG; she tolerated ~8+ hours yesterday per RT(stating it was her "first" successful weaning day). Cuff deflated at baseline while on TC. Tracheal secretions continue to be Moderate and thick at the tracheal level only. Pt resting in bed w/ open-mouth posture. Brief head nod to basic Y/N questions, though this was inconsistent.  Daughter present. Pt currently has a NGT for TFs.  Pt and Dtr given explanation of the PMV, its use/wear, and roadblocks to its use/wear. Trach Cuff deflated at baseline; ensured. Noted airflow/exhalation at trach level; moisture in the TC itself. Finger occlusion first attempted, inconsistent follow through w/ attempts(flexi inner cannula was difficult to occlude). Phonations were not achieved.  Placement of PMV then attempted in order to provide occlusion. Pt was unable to redirect airflow superiorly to phonate or vocalize. A slight, gravely phonation noted x1-2. Pt's respiratory effort appeared uncomfortable w/ accessory muscle use/change; PMV removed, and a rush of air noted at the level of trach -- suspect breath stacking occurring. This was attempted a 2nd time w/ same result. W/ both attempts, PMV placement was ~20-25 seconds. RR: 22-24, O2 sats 99-100%, HR 84-88. FiO2 28% at 5L per chart.  PMV placement was not tolerated during session. Overt discomfort noted w/ placement attempts x2. Suspect breath stacking d/t impact of Shiley #8 -- too large a diameter of trach. No overt desaturation or change in HR/RR noted during brief placement but no superior air movement was sustained. Recommend f/u w/ ENT/Pulmonology to determine pt's readiness for trach downsize to a Shiley #6 for smaller diameter and air movement around the trach itself for superior air movement. This was discussed at Rounds w/ MD. ST services will continue to follow pt's status awaiting appropriate time for re-evaluation of PMV toleration post trach downsize. Much education was given to both Daughter present, then Husband. Both agreed. SLP Visit Diagnosis: Aphonia (R49.1) (tracheostomy)    SLP Assessment  Patient needs continued Speech Lanaguage Pathology Services    Recommendations for follow up therapy are one component of a multi-disciplinary discharge planning process, led by the attending physician.  Recommendations may be updated  based on patient status, additional functional criteria and insurance authorization.  Follow Up Recommendations  Skilled nursing-short term rehab (<3 hours/day)    Assistance Recommended at Discharge Frequent or constant Supervision/Assistance  Functional Status Assessment Patient has had a recent decline in their functional status and demonstrates the ability to make significant improvements in function in a reasonable and predictable amount of time. (TBD)  Frequency and Duration  (when trach can be downsized to a shiley #6)   (TBD)    PMSV Trial PMSV was placed for: ~25 seconds Able to redirect subglottic air through upper airway: No Able to Attain Phonation: No Voice Quality:  (slight gravely noise noted x1-2; no phonations or vocalizations) Able to Expectorate Secretions: No (not sufficient) Level of Secretion Expectoration with PMSV: Tracheal Breath Support for Phonation:  (n/a) Intelligibility: Unable to assess (comment) Respirations During Trial: 22 SpO2 During Trial: 100 % Pulse During Trial: 84 Behavior: Alert (nodded head x1; worried look towards end of session)   Tracheostomy Tube  Additional Tracheostomy Tube Assessment Trach Collar Period: ~1-2 hours this AM; ~8+ hours yesterday on 04/19/2021 Secretion Description: moderate, thick Frequency of Tracheal Suctioning: PRN Level of Secretion Expectoration: Tracheal    Vent Dependency  Vent Dependent: No (nocturnal) FiO2 (%): 28 %    Cuff Deflation Trial Tolerated  Cuff Deflation: Yes Length of Time for Cuff Deflation Trial: ~1-2 hours this morning; ~8+ hours yesterday Behavior: Alert (nodded head slightly to some Y/N; inconsistent) Cuff Deflation Trial - Comments: pt is tolerating cuff deflation at baseline during TC weaning; this is the 3rd day of weaning w/ yesterday being her "first successful day", per RT.         Uvaldo Rybacki 05/21/2021, 2:36 PM Jerilynn Som, MS, CCC-SLP Speech Language  Pathologist Rehab Services; [facility] [phone] (ascom)

## 2021-05-21 NOTE — Progress Notes (Signed)
I  Date of Admission:  05/04/2021     ID: Alexandra Foster is a 71 y.o. female  Principal Problem:   Severe sepsis with septic shock (CODE) ([place]) Active Problems:   COPD (chronic obstructive pulmonary disease) ([place])   Morbid obesity ([place])   Diabetes mellitus type 2 in obese ([place])   Atrial fibrillation and flutter ([place])   Necrotizing fasciitis ([place])   Acute on chronic respiratory failure ([place])   On mechanically assisted ventilation ([place])   Shock liver   Anemia associated with acute blood loss   Metabolic acidosis   Encounter for continuous renal replacement therapy (CRRT) for acute renal failure (HCC)   Acute encephalopathy   Group A streptococcal infection   Streptococcal toxic shock syndrome ([place])   Status post tracheostomy ([place])   Subdural hemorrhage (HCC)   Left leg cellulitis    Subjective: Pt does not give any ROS Ventilated Having fever since last night  Medications:   amiodarone  200 mg Per Tube Daily   arformoterol  15 mcg Nebulization BID   vitamin C  500 mg Per Tube BID   budesonide (PULMICORT) nebulizer solution  0.25 mg Nebulization BID   chlorhexidine gluconate (MEDLINE KIT)  15 mL Mouth Rinse BID   Chlorhexidine Gluconate Cloth  6 each Topical Q0600   free water  30 mL Per Tube Q4H   heparin injection (subcutaneous)  5,000 Units Subcutaneous Q12H   insulin aspart  0-9 Units Subcutaneous Q4H   insulin aspart  4 Units Subcutaneous Q4H   insulin detemir  17 Units Subcutaneous BID   magic mouthwash w/lidocaine  10 mL Oral QID   mouth rinse  15 mL Mouth Rinse 10 times per day   midodrine  10 mg Per Tube TID WC   multivitamin  1 tablet Per Tube QHS   nutrition supplement (JUVEN)  1 packet Per Tube BID BM   pantoprazole (PROTONIX) IV  40 mg Intravenous Q24H   revefenacin  175 mcg Nebulization Daily   sodium chloride flush  10-40 mL Intracatheter Q12H   zinc sulfate  220 mg Per Tube Daily    Objective: Vital signs in last 24 hours: Temp:  [97.7 F  (36.5 C)-101.3 F (38.5 C)] 100.4 F (38 C) (02/07 1400) Pulse Rate:  [70-89] 79 (02/07 1400) Resp:  [16-37] 22 (02/07 1400) BP: (105-134)/(44-55) 131/52 (02/07 1400) SpO2:  [95 %-100 %] 100 % (02/07 1400) FiO2 (%):  [28 %-30 %] 28 % (02/07 1120) Weight:  [117.1 kg] 117.1 kg (02/07 0413)  PHYSICAL EXAM:  General: On calling the name patient opens her eyes.  She does not talk.  But can squeeze my fingers as commanded Not in distress  Head: Normocephalic, without obvious abnormality, atraumatic. Eyes: Conjunctivae clear, anicteric sclerae. Pupils are equal ENT t- tongue tip looks ischemic with eschar Neck: Tracheostomy - gelatinous fluid no carotid bruit and no JVD.  Lungs: Bilateral air entry  heart: Regular rate and rhythm, no murmur, rub or gallop. Abdomen: Soft, non-tender,not distended. Bowel sounds normal. No masses Extremities: Left leg has wound VAC Picture from today reviewed     Lymph: Cervical, supraclavicular normal. Neurologic: Cannot be assessed Bilateral IJ lines.  On CRRT NG tube on tube feeds Foley catheter  anuria   Lab Results Recent Labs    05/20/21 0550 05/20/21 1630 05/21/21 0402  WBC 25.0*  --  22.7*  HGB 7.8*  --  7.4*  HCT 22.5*  --  22.5*  NA  --  133* 134*  K  --  4.7 4.6  CL  --  100 101  CO2  --  22 22  BUN  --  44* 63*  CREATININE  --  0.93 1.69*   Liver Panel Recent Labs    05/20/21 1630 05/21/21 0402  ALBUMIN 3.2* 2.9*   Sedimentation Rate No results for input(s): ESRSEDRATE in the last 72 hours. C-Reactive Protein No results for input(s): CRP in the last 72 hours.  Microbiology: [facility] 05/04/21- GAS 05/07/21- Wound culture NG 05/09/21 BC -NG Studies/Results: DG Abd 1 View  Result Date: 05/20/2021 CLINICAL DATA:  Vomiting EXAM: ABDOMEN - 1 VIEW COMPARISON:  Abdominal radiograph dated May 17, 2021 FINDINGS: NG tube tip and side port within the stomach. Paucity of bowel gas somewhat limits evaluation. A few nondilated  gas-filled loops of bowel are seen in the pelvis. IMPRESSION: Paucity of bowel gas somewhat limits evaluation. A few nondilated gas-filled loops of bowel are seen in the pelvis. Electronically Signed   By: Yetta Glassman M.D.   On: 05/20/2021 08:12   DG Chest Port 1 View  Result Date: 05/20/2021 CLINICAL DATA:  Vomiting EXAM: PORTABLE CHEST 1 VIEW COMPARISON:  05/15/2021 chest radiograph. FINDINGS: Tracheostomy tube tip overlies the tracheal air column at the thoracic inlet. Right internal jugular central venous catheter terminates over the cavoatrial junction. Enteric tube enters stomach with the tip not seen on this image. Left internal jugular central venous catheter terminates in the upper third of the SVC. Stable cardiomediastinal silhouette with mild cardiomegaly. No pneumothorax. Trace bilateral pleural effusions, similar. Borderline mild pulmonary edema, improved. Improved lung volumes with residual mild left basilar atelectasis. IMPRESSION: 1. Mild position support structures.  No pneumothorax. 2. Borderline mild congestive heart failure, improved. 3. Trace bilateral pleural effusions, similar. 4. Improved lung volumes with residual mild left basilar atelectasis. Electronically Signed   By: Ilona Sorrel M.D.   On: 05/20/2021 08:12     Assessment/Plan:  Group A streptococcus bacteremia with invasive infection with septic shock, multiorgan failure.  Necrotizing infection of the left leg.  Status post debridement.  Has wound VAC Patient completed 15 days of appropriate antibiotics   She also received 3 doses of IVIG.  Leukocytosis.    WBC 22 K.  Wonder whether it is because of the tongue which is kind of looking infected VS tracheostomy infection Culture sent Changed unasyn to zosyn  Encephalopathy: Improved Seizures on Keppra Small subdural hemorrhage.  Normal MRA  AKI on CRRT  Acute hypoxic respiratory failure.  Tracheostomy and is on the vent  Shock liver with transaminitis  improving  Anemia due to infection and blood loss from the left leg.  Has received multiple units of PRBC  A-fib now in sinus rhythm.  On amiodarone.  Followed by cardiology.  Anasarca.  Much better  Discussed management with the care team.

## 2021-05-21 NOTE — Progress Notes (Signed)
PHARMACY NOTE:  ANTIMICROBIAL RENAL DOSAGE ADJUSTMENT  Current antimicrobial regimen includes a mismatch between antimicrobial dosage and estimated renal function.  As per policy approved by the Pharmacy & Therapeutics and Medical Executive Committees, the antimicrobial dosage will be adjusted accordingly.  Current antimicrobial dosage:  Unasyn 3 g IV q8h  Indication: Wound infection / mouth infection  Renal Function:  Estimated Creatinine Clearance: 38.5 mL/min (A) (by C-G formula based on SCr of 1.69 mg/dL (H)). [x]      On intermittent HD, scheduled: pending 2/7. Low threshold to switch back to CRRT per nephrology. Patient still with tenuous hemodynamics []      On CRRT >> stopped 2/6    Antimicrobial dosage has been changed to:  Unasyn 3 g IV q12h  Additional comments: Will continue to follow RRT plan per nephrology and make adjustments as indicated   Thank you for allowing pharmacy to be a part of this patient's care.  Benita Gutter, [facility] 05/21/2021 11:14 AM

## 2021-05-21 NOTE — Progress Notes (Signed)
Subjective:   CC: dysphagia  HPI:  Alexandra Foster is a 71 y.o. female who was consulted by aleskerov for evaluation of above.  Prolonged ICU stay with interval development of dysphagia, concerns for need for long-term nutritional support.  PEG consult placed.  Unable to obtain history directly from patient due to current condition.   Past Medical History:  has a past medical history of AKI (acute kidney injury) ([place]), Arthritis, Asthma, Bacteremia, COPD (chronic obstructive pulmonary disease) ([place]), Diabetes mellitus without complication ([place]), Endometriosis, GERD (gastroesophageal reflux disease), History of echocardiogram, Hypertension, Obesity, PAF (paroxysmal atrial fibrillation) ([place]), and Sleep apnea.  Past Surgical History:  has a past surgical history that includes Nasal sinus surgery (2002); Colonoscopy (10/29/2006); Colonoscopy with propofol (N/A, 11/05/2016); Abdominal hysterectomy; Wound debridement (Left, 05/07/2021); Incision and drainage of wound (Left, 05/10/2021); Application if wound vac (05/10/2021); Tracheostomy tube placement (N/A, 0/12/4707); and Application if wound vac (Left, 05/17/2021).  Family History: family history includes Alcohol abuse in her brother; Breast cancer (age of onset: 62) in her sister; Congestive Heart Failure in her mother; Coronary artery disease (age of onset: 28) in her father; Heart disease in her brother; Varicose Veins in her brother.  Social History:  reports that she quit smoking about 20 years ago. Her smoking use included cigarettes. She has a 80.00 pack-year smoking history. She quit smokeless tobacco use about 20 years ago.  Her smokeless tobacco use included snuff. She reports that she does not drink alcohol and does not use drugs.  Current Medications:  Prior to Admission medications   Medication Sig Start Date End Date Taking? Authorizing Provider  albuterol (PROVENTIL) (2.5 MG/3ML) 0.083% nebulizer solution Take 3 mLs (2.5 mg total) by  nebulization every 6 (six) hours as needed for wheezing or shortness of breath. 10/03/19  Yes Sowles, Drue Stager, MD  aspirin 81 MG tablet Take 1 tablet (81 mg total) by mouth daily. 01/08/16  Yes Sowles, Drue Stager, MD  atorvastatin (LIPITOR) 40 MG tablet Take 1 tablet (40 mg total) by mouth daily. 02/11/21  Yes Sowles, Drue Stager, MD  azelastine (ASTELIN) 0.1 % nasal spray Place 2 sprays into both nostrils 2 (two) times daily as needed for rhinitis or allergies. 07/31/20  Yes Delsa Grana, PA-C  baclofen (LIORESAL) 10 MG tablet Take 10 mg by mouth daily. 04/16/21  Yes [provider]  celecoxib (CELEBREX) 200 MG capsule Take 200 mg by mouth daily. 04/03/21  Yes [provider]  Cetirizine HCl (ZYRTEC ALLERGY) 10 MG TBDP Take 10 mg by mouth at bedtime. For sinus symptoms and seasonal allergies 07/31/20  Yes Delsa Grana, PA-C  docusate sodium (COLACE) 100 MG capsule Take 100 mg by mouth daily.   Yes [provider]  fluticasone Asencion Islam) 50 MCG/ACT nasal spray  08/31/20  Yes [provider]  glucose blood (ACCU-CHEK AVIVA PLUS) test strip Use as instructed 12/08/17  Yes Sowles, Drue Stager, MD  Lancets (ACCU-CHEK SOFT TOUCH) lancets Use as instructed 12/08/17  Yes Sowles, Drue Stager, MD  meloxicam (MOBIC) 15 MG tablet Take 15 mg by mouth daily. 04/23/21  Yes [provider]  metFORMIN (GLUCOPHAGE-XR) 750 MG 24 hr tablet Take 1 tablet (750 mg total) by mouth daily with breakfast. 02/11/21  Yes Sowles, Drue Stager, MD  montelukast (SINGULAIR) 10 MG tablet Take 1 tablet (10 mg total) by mouth at bedtime. 02/11/21  Yes Sowles, Drue Stager, MD  Multiple Vitamins-Minerals (MULTIVITAL PO) Take 1 tablet by mouth daily.   Yes [provider]  omeprazole (PRILOSEC) 20 MG capsule Take  1 capsule (20 mg total) by mouth daily. 02/11/21  Yes Steele Sizer, MD  [place] 30 MG/2ML SOSY  10/09/20  Yes [provider]  triamterene-hydrochlorothiazide (DYAZIDE) 37.5-25 MG capsule TAKE 1  CAPSULE EVERY DAY 05/02/21  Yes Sowles, Drue Stager, MD  vitamin B-12 (CYANOCOBALAMIN) 100 MCG tablet Take 100 mcg by mouth daily.   Yes [provider]  calcium carbonate (OSCAL) 1500 (600 Ca) MG TABS tablet Take 600 mg of elemental calcium by mouth 2 (two) times daily with a meal. Patient not taking: Reported on 05/05/2021    [provider]  Fluticasone-Umeclidin-Vilant (TRELEGY ELLIPTA) 100-62.5-25 MCG/INH AEPB Inhale 1 puff into the lungs daily. In place of Stiolto 02/10/20   Steele Sizer, MD  loratadine (CLARITIN) 10 MG tablet Take 1 tablet (10 mg total) by mouth daily. 02/11/21   Steele Sizer, MD    Allergies:  No Known Allergies  ROS:  Unable to obtain due to pt status   Objective:     BP (!) 121/52 (BP Location: Left Arm)    Pulse 84    Temp (!) 100.6 F (38.1 C) (Rectal)    Resp (!) 22    Ht 5\' 5"  (1.651 m)    Wt 117.1 kg    SpO2 98%    BMI 42.96 kg/m   Constitutional :  no distress  Lymphatics/Throat:  no asymmetry, masses, or scars  Respiratory:  clear to auscultation bilaterally  Cardiovascular:  regular rate and rhythm  Gastrointestinal: soft, non-tender; bowel sounds normal; no masses,  no organomegaly.  Musculoskeletal: Steady gait and movement  Skin: Cool and moist, no upper abdominal surgical scars  Psychiatric: Normal affect, non-agitated, not confused       LABS:  CMP Latest Ref Rng & Units 05/21/2021 05/20/2021 05/20/2021  Glucose 70 - 99 mg/dL 134(H) 238(H) 158(H)  BUN 8 - 23 mg/dL 63(H) 44(H) 36(H)  Creatinine 0.44 - 1.00 mg/dL 1.69(H) 0.93 0.90  Sodium 135 - 145 mmol/L 134(L) 133(L) 134(L)  Potassium 3.5 - 5.1 mmol/L 4.6 4.7 4.5  Chloride 98 - 111 mmol/L 101 100 100  CO2 22 - 32 mmol/L 22 22 24   Calcium 8.9 - 10.3 mg/dL 9.2 9.1 9.0  Total Protein 6.5 - 8.1 g/dL - - -  Total Bilirubin 0.3 - 1.2 mg/dL - - -  Alkaline Phos 38 - 126 U/L - - -  AST 15 - 41 U/L - - -  ALT 0 - 44 U/L - - -   CBC Latest Ref Rng & Units 05/21/2021 05/20/2021  05/19/2021  WBC 4.0 - 10.5 K/uL 22.7(H) 25.0(H) 25.9(H)  Hemoglobin 12.0 - 15.0 g/dL 7.4(L) 7.8(L) 7.7(L)  Hematocrit 36.0 - 46.0 % 22.5(L) 22.5(L) 22.6(L)  Platelets 150 - 400 K/uL 166 161 127(L)    RADS: N/a  Assessment:     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   Plan:     Discussed with family member R/b/a discussed.  Risks include bleeding, perforation, tube dysfunction, inability to complete procedure.  Benefits include nutritional benefits.  Alternatives include continued feeding via NG tube. She verbalized understanding and agreeable to proceed.     labs/images/medications/previous chart entries reviewed personally and relevant changes/updates noted above.

## 2021-05-21 NOTE — Progress Notes (Signed)
Patient placed on 30% ATC and appears to be tolerating well at this time.

## 2021-05-21 NOTE — Progress Notes (Signed)
[facility]  PROGRESS NOTE   Subjective:   CRRT stopped yesterday. UF 1856m. UOP 571m   Amiodarone gtt Vasopressin gtt  Daughter at bedside.   Febrile throughout the night.   Objective:  Vital signs in last 24 hours:  Temp:  [96.4 F (35.8 C)-101.3 F (38.5 C)] 100 F (37.8 C) (02/07 0800) Pulse Rate:  [66-89] 70 (02/07 0800) Resp:  [16-37] 22 (02/07 0800) BP: (105-134)/(44-60) 112/54 (02/07 0800) SpO2:  [97 %-100 %] 100 % (02/07 0800) FiO2 (%):  [30 %] 30 % (02/07 0834) Weight:  [117.1 kg] 117.1 kg (02/07 0413)  Weight change: 4.4 kg Filed Weights   05/19/21 0600 05/20/21 0600 05/21/21 0413  Weight: 114.9 kg 112.7 kg 117.1 kg    Intake/Output: I/O last 3 completed shifts: In: 3127.9 [I.V.:844.3; Other:75; NGRA/QT:6226.3IV Piggyback:860.6] Out: 503354Urine:50; Emesis/NG output:100; OtTGYBW:3893Stool:585]   Intake/Output this shift:  Total I/O In: 280 [I.V.:80; IV Piggyback:200] Out: -   Physical Exam: General:  Critically ill  Head:  ETT  Eyes:  Anicteric  Neck:  Trachea collar  Lungs:   Trach collar, diminished bilaterally  Heart:  regular  Abdomen:   Soft, nontender  Extremities:  +peripheral edema. Left lower extremity wound vac  Neurologic:  Off sedation  Skin:  No lesions  Access: Left IJ temp HD catheter    Basic Metabolic Panel: Recent Labs  Lab 05/17/21 0350 05/17/21 1604 05/18/21 0305 05/18/21 1614 05/19/21 0420 05/19/21 0421 05/19/21 1606 05/20/21 0549 05/20/21 0550 05/20/21 1630 05/21/21 0402  NA 135   < > 134*   < > 135  --  137 134*  --  133* 134*  K 4.5   < > 4.4   < > 4.0  --  3.6 4.5  --  4.7 4.6  CL 102   < > 100   < > 102  --  103 100  --  100 101  CO2 21*   < > 23   < > 22  --  23 24  --  22 22  GLUCOSE 103*   < > 197*   < > 282*  --  125* 158*  --  238* 134*  BUN 44*   < > 38*   < > 50*  --  55* 36*  --  44* 63*  CREATININE 1.38*   < > 1.12*   < > 1.14*  --  1.13* 0.90  --  0.93 1.69*  CALCIUM 8.3*   < >  8.3*   < > 8.9  --  9.2 9.0  --  9.1 9.2  MG 2.4  --  2.4  --   --  2.7*  --   --  2.5*  --  2.6*  PHOS 3.6   < > 3.4   < > 3.0  --  1.5* 5.1*  --  2.5 2.8   < > = values in this interval not displayed.     CBC: Recent Labs  Lab 05/15/21 0313 05/16/21 0422 05/17/21 0350 05/18/21 0305 05/19/21 0421 05/20/21 0550 05/21/21 0402  WBC 24.5*   < > 11.4* 14.6* 25.9* 25.0* 22.7*  NEUTROABS 22.7*  --   --   --  18.9* 19.4* 17.5*  HGB 8.7*   < > 9.2* 8.3* 7.7* 7.8* 7.4*  HCT 26.5*   < > 28.0* 25.3* 22.6* 22.5* 22.5*  MCV 88.0   < > 87.5 87.2 83.7 82.4 87.2  PLT 122*   < >  156 152 127* 161 166   < > = values in this interval not displayed.      Urinalysis: No results for input(s): COLORURINE, LABSPEC, PHURINE, GLUCOSEU, HGBUR, BILIRUBINUR, KETONESUR, PROTEINUR, UROBILINOGEN, NITRITE, LEUKOCYTESUR in the last 72 hours.  Invalid input(s): APPERANCEUR    Imaging: DG Abd 1 View  Result Date: 05/20/2021 CLINICAL DATA:  Vomiting EXAM: ABDOMEN - 1 VIEW COMPARISON:  Abdominal radiograph dated May 17, 2021 FINDINGS: NG tube tip and side port within the stomach. Paucity of bowel gas somewhat limits evaluation. A few nondilated gas-filled loops of bowel are seen in the pelvis. IMPRESSION: Paucity of bowel gas somewhat limits evaluation. A few nondilated gas-filled loops of bowel are seen in the pelvis. Electronically Signed   By: Yetta Glassman M.D.   On: 05/20/2021 08:12   DG Chest Port 1 View  Result Date: 05/20/2021 CLINICAL DATA:  Vomiting EXAM: PORTABLE CHEST 1 VIEW COMPARISON:  05/15/2021 chest radiograph. FINDINGS: Tracheostomy tube tip overlies the tracheal air column at the thoracic inlet. Right internal jugular central venous catheter terminates over the cavoatrial junction. Enteric tube enters stomach with the tip not seen on this image. Left internal jugular central venous catheter terminates in the upper third of the SVC. Stable cardiomediastinal silhouette with mild cardiomegaly.  No pneumothorax. Trace bilateral pleural effusions, similar. Borderline mild pulmonary edema, improved. Improved lung volumes with residual mild left basilar atelectasis. IMPRESSION: 1. Mild position support structures.  No pneumothorax. 2. Borderline mild congestive heart failure, improved. 3. Trace bilateral pleural effusions, similar. 4. Improved lung volumes with residual mild left basilar atelectasis. Electronically Signed   By: Ilona Sorrel M.D.   On: 05/20/2021 08:12     Medications:    sodium chloride 250 mL (05/20/21 1427)   amiodarone 30 mg/hr (05/21/21 0847)   ampicillin-sulbactam (UNASYN) IV     erythromycin Stopped (05/21/21 0734)   norepinephrine (LEVOPHED) Adult infusion     vasopressin 0.03 Units/min (05/21/21 0847)    arformoterol  15 mcg Nebulization BID   vitamin C  500 mg Per Tube BID   budesonide (PULMICORT) nebulizer solution  0.25 mg Nebulization BID   chlorhexidine gluconate (MEDLINE KIT)  15 mL Mouth Rinse BID   Chlorhexidine Gluconate Cloth  6 each Topical Q0600   feeding supplement (PIVOT 1.5 CAL)  1,000 mL Per Tube Q24H   free water  30 mL Per Tube Q4H   heparin injection (subcutaneous)  5,000 Units Subcutaneous Q12H   insulin aspart  0-9 Units Subcutaneous Q4H   insulin aspart  4 Units Subcutaneous Q4H   insulin detemir  17 Units Subcutaneous BID   levETIRAcetam  500 mg Per Tube BID   magic mouthwash w/lidocaine  10 mL Oral QID   mouth rinse  15 mL Mouth Rinse 10 times per day   midodrine  10 mg Per Tube TID WC   multivitamin  1 tablet Per Tube QHS   nutrition supplement (JUVEN)  1 packet Per Tube BID BM   pantoprazole (PROTONIX) IV  40 mg Intravenous Q24H   revefenacin  175 mcg Nebulization Daily   sodium chloride flush  10-40 mL Intracatheter Q12H   zinc sulfate  220 mg Per Tube Daily    Assessment/ Plan:     Principal Problem:   Severe sepsis with septic shock (CODE) (HCC) Active Problems:   COPD (chronic obstructive pulmonary disease) (HCC)    Morbid obesity (HCC)   Diabetes mellitus type 2 in obese (HCC)   Atrial fibrillation and flutter (  [place])   Necrotizing fasciitis ([place])   Acute on chronic respiratory failure ([place])   On mechanically assisted ventilation ([place])   Shock liver   Anemia associated with acute blood loss   Metabolic acidosis   Encounter for continuous renal replacement therapy (CRRT) for acute renal failure ([place])   Acute encephalopathy   Group A streptococcal infection   Streptococcal toxic shock syndrome ([place])   Status post tracheostomy ([place])   Subdural hemorrhage ([place])   Left leg cellulitis  Alexandra Foster is a 71 y.o. black female with COPD/asthma, diabetes mellitus type II, hypertension, obstructive sleep apnea, paroxysmal atrial fibrillation, GERD, who presents to [facility] on 05/04/2021 for AKI (acute kidney injury) ([place]) [N17.9] Left leg cellulitis [L03.116] Severe sepsis ([place]) [A41.9, R65.20] Severe sepsis with lactic acidosis ([place]) [A41.9, R65.20, E87.20]   #1: Acute kidney injury: AKI is secondary to ischemic nephropathy secondary to hypotension complicated with sepsis. History of bland urine. Baseline creatinine of 0.98, GFR > 60 on 02/11/21. Anuric urine output.  - Plan for intermittent hemodialysis today. Orders prepared. Low threshold to revert back to continous renal replacement therapy.    #2: Sepsis: necrotizing fasciitis for left lower extremity status post multiple debridement. Requiring vasopressors: vasopressin. Continue ceftriaxone.  - restart norepinephrine  #3. Anemia with kidney injury: hemoglobin 7.4   - EPO ordered for HD treatment today.   #4. Acute Respiratory Failure: weaning off mechanical ventilation.   Overall prognosis is guarded.  Discussed case with daughter in detail.     LOS: [place] 2/7/20239:46 AM

## 2021-05-21 NOTE — Progress Notes (Signed)
Neuro: Responds to voice and pain, answers questions appropriately with head gestures. Resp: Trach collar at 5L throughout day shift. Site questionable for complications. ID and NP assessed, new orders for Zosyn placed by ID. Cardio: NSR, MAP goal > 65. Vaso off and Levo at 11ml/hr. GI/GU: scant OP from [place] Skin: No changes from previous shift. Psych: Anxious with interactions related to fear of pain.  Events: Wound culture collected by RT and sent for eval r/t trach site.

## 2021-05-21 NOTE — Progress Notes (Signed)
Progress Note  Patient Name: Honey Zakarian Date of Encounter: 05/21/2021  [facility] Cardiologist: Kathlyn Sacramento, MD   Subjective   Patient is awake, but somnolent. Denies pain anywhere. She is on NSR.   Inpatient Medications    Scheduled Meds:  amiodarone  200 mg Per Tube Daily   arformoterol  15 mcg Nebulization BID   vitamin C  500 mg Per Tube BID   budesonide (PULMICORT) nebulizer solution  0.25 mg Nebulization BID   chlorhexidine gluconate (MEDLINE KIT)  15 mL Mouth Rinse BID   Chlorhexidine Gluconate Cloth  6 each Topical Q0600   feeding supplement (PIVOT 1.5 CAL)  1,000 mL Per Tube Q24H   free water  30 mL Per Tube Q4H   heparin injection (subcutaneous)  5,000 Units Subcutaneous Q12H   insulin aspart  0-9 Units Subcutaneous Q4H   insulin aspart  4 Units Subcutaneous Q4H   insulin detemir  17 Units Subcutaneous BID   magic mouthwash w/lidocaine  10 mL Oral QID   mouth rinse  15 mL Mouth Rinse 10 times per day   midodrine  10 mg Per Tube TID WC   multivitamin  1 tablet Per Tube QHS   nutrition supplement (JUVEN)  1 packet Per Tube BID BM   pantoprazole (PROTONIX) IV  40 mg Intravenous Q24H   revefenacin  175 mcg Nebulization Daily   sodium chloride flush  10-40 mL Intracatheter Q12H   zinc sulfate  220 mg Per Tube Daily   Continuous Infusions:  sodium chloride 250 mL (05/20/21 1427)   ampicillin-sulbactam (UNASYN) IV     erythromycin Stopped (05/21/21 0734)   norepinephrine (LEVOPHED) Adult infusion     PRN Meds: acetaminophen, artificial tears, docusate, fentaNYL (SUBLIMAZE) injection, lip balm, midazolam, ondansetron (ZOFRAN) IV, oxyCODONE, sennosides, sodium chloride flush   Vital Signs    Vitals:   05/21/21 0700 05/21/21 0720 05/21/21 0727 05/21/21 0800  BP: (!) 126/53   (!) 112/54  Pulse: 81   70  Resp: 20   (!) 22  Temp: 100 F (37.8 C)   100 F (37.8 C)  TempSrc:      SpO2: 100% 100% 100% 100%  Weight:      Height:         Intake/Output Summary (Last 24 hours) at 05/21/2021 1019 Last data filed at 05/21/2021 0847 Gross per 24 hour  Intake 1951.73 ml  Output 1478 ml  Net 473.73 ml   Last 3 Weights 05/21/2021 05/20/2021 05/19/2021  Weight (lbs) 258 lb 2.5 oz 248 lb 7.3 oz 253 lb 4.9 oz  Weight (kg) 117.1 kg 112.7 kg 114.9 kg      Telemetry    NSR 80s - Personally Reviewed  ECG    No new - Personally Reviewed  Physical Exam   GEN: somnolent  Neck: s/p trach Cardiac: RRR, no murmurs, rubs, or gallops.  Respiratory: vent sounds. GI: Soft, nontender, non-distended  MS: No edema; No deformity. Psych: unable to assess   Labs    High Sensitivity Troponin:   Recent Labs  Lab 05/15/21 1708 05/15/21 1926  TROPONINIHS 338* 305*     Chemistry Recent Labs  Lab 05/15/21 0313 05/15/21 1708 05/17/21 0350 05/17/21 1604 05/19/21 0421 05/19/21 1606 05/20/21 0549 05/20/21 0550 05/20/21 1630 05/21/21 0402  NA 131*   < > 135   < >  --    < > 134*  --  133* 134*  K 3.8   < > 4.5   < >  --    < >  4.5  --  4.7 4.6  CL 98   < > 102   < >  --    < > 100  --  100 101  CO2 24   < > 21*   < >  --    < > 24  --  22 22  GLUCOSE 228*   < > 103*   < >  --    < > 158*  --  238* 134*  BUN 48*   < > 44*   < >  --    < > 36*  --  44* 63*  CREATININE 1.66*   < > 1.38*   < >  --    < > 0.90  --  0.93 1.69*  CALCIUM 9.2   < > 8.3*   < >  --    < > 9.0  --  9.1 9.2  MG 1.9   < > 2.4   < > 2.7*  --   --  2.5*  --  2.6*  PROT 6.6  --  6.2*  --   --   --   --   --   --   --   ALBUMIN 2.6*   2.6*   < > 1.9*   1.9*   < >  --    < > 3.7  --  3.2* 2.9*  AST 639*  --  163*  --   --   --   --   --   --   --   ALT 749*  --  326*  --   --   --   --   --   --   --   ALKPHOS 328*  --  452*  --   --   --   --   --   --   --   BILITOT 5.0*  --  3.6*  --   --   --   --   --   --   --   GFRNONAA 33*   < > 41*   < >  --    < > >60  --  >60 32*  ANIONGAP 9   < > 12   < >  --    < > 10  --  11 11   < > = values in this interval not  displayed.    Lipids  Recent Labs  Lab 05/15/21 0313  TRIG 121    Hematology Recent Labs  Lab 05/19/21 0421 05/20/21 0550 05/21/21 0402  WBC 25.9* 25.0* 22.7*  RBC 2.70* 2.73* 2.58*  HGB 7.7* 7.8* 7.4*  HCT 22.6* 22.5* 22.5*  MCV 83.7 82.4 87.2  MCH 28.5 28.6 28.7  MCHC 34.1 34.7 32.9  RDW 20.7* 20.6* 20.6*  PLT 127* 161 166   Thyroid No results for input(s): TSH, FREET4 in the last 168 hours.  BNPNo results for input(s): BNP, PROBNP in the last 168 hours.  DDimer No results for input(s): DDIMER in the last 168 hours.   Radiology    DG Abd 1 View  Result Date: 05/20/2021 CLINICAL DATA:  Vomiting EXAM: ABDOMEN - 1 VIEW COMPARISON:  Abdominal radiograph dated May 17, 2021 FINDINGS: NG tube tip and side port within the stomach. Paucity of bowel gas somewhat limits evaluation. A few nondilated gas-filled loops of bowel are seen in the pelvis. IMPRESSION: Paucity of bowel gas somewhat limits evaluation. A few nondilated gas-filled loops of bowel are  seen in the pelvis. Electronically Signed   By: Yetta Glassman M.D.   On: 05/20/2021 08:12   DG Chest Port 1 View  Result Date: 05/20/2021 CLINICAL DATA:  Vomiting EXAM: PORTABLE CHEST 1 VIEW COMPARISON:  05/15/2021 chest radiograph. FINDINGS: Tracheostomy tube tip overlies the tracheal air column at the thoracic inlet. Right internal jugular central venous catheter terminates over the cavoatrial junction. Enteric tube enters stomach with the tip not seen on this image. Left internal jugular central venous catheter terminates in the upper third of the SVC. Stable cardiomediastinal silhouette with mild cardiomegaly. No pneumothorax. Trace bilateral pleural effusions, similar. Borderline mild pulmonary edema, improved. Improved lung volumes with residual mild left basilar atelectasis. IMPRESSION: 1. Mild position support structures.  No pneumothorax. 2. Borderline mild congestive heart failure, improved. 3. Trace bilateral pleural  effusions, similar. 4. Improved lung volumes with residual mild left basilar atelectasis. Electronically Signed   By: Ilona Sorrel M.D.   On: 05/20/2021 08:12    Cardiac Studies   TTE 05/2021 1. Left ventricular ejection fraction, by estimation, is 60 to 65%. The  left ventricle has normal function. The left ventricle has no regional  wall motion abnormalities. There is mild left ventricular hypertrophy.  Left ventricular diastolic parameters  are consistent with Grade I diastolic dysfunction (impaired relaxation).   2. Right ventricular systolic function is normal. The right ventricular  size is normal. There is mildly elevated pulmonary artery systolic  pressure. The estimated right ventricular systolic pressure is 69.6 mmHg.   3. The mitral valve is normal in structure. No evidence of mitral valve  regurgitation. No evidence of mitral stenosis.   4. The aortic valve is normal in structure. Aortic valve regurgitation is  not visualized. No aortic stenosis is present.   5. The inferior vena cava is normal in size with greater than 50%  respiratory variability, suggesting right atrial pressure of 3 mmHg.     Patient Profile     71 y.o. female  with history of hypertension, diabetes, obesity, COPD admitted with lower extremity cellulitis, bacteremia and shock.  Hospital course complicated by respiratory failure s/p trach, necrotizing fasciitis of the left lower extremity s/p extensive debridement who is being seen for A. fib RVR.  Assessment & Plan    Afib RVR - maintaining NSR - IV amiodarone, transition to oral amiodarone today - heparin held with anemia and small SAH.  - Hgb 7.4 - Echo showed preserved LVEF   Necrotizing fascitis/bacteremia/Septic chock - s/p multiple debridements of lower left leg - off pressors - s/p IVIG - wound VAC - abx per ICU   Respiratory failure s/p trach - trach/vent management per respiratory/ICU team   AKI  - off CRRT - nephology  following  For questions or updates, please contact [facility] Please consult [email or web address] for contact info under        Signed, Forrester Blando Ninfa Meeker, PA-C  05/21/2021, 10:19 AM

## 2021-05-21 NOTE — Progress Notes (Signed)
°   05/21/21 1540  Clinical Encounter Type  Visited With Family  Visit Type Follow-up;Spiritual support;Social support  Referral From Other (Comment) (rounding)  Alexandra Foster engaged Pt's daughter in hallway outside ICU. Chaplain B inquired about progress and what needs the family has at this time. Also inquired about family's self-care and general well-being since  Alexandra Foster's extended time of hospitalization. Chaplain B offered ongoing support and prayer; will f/u on 2/8.

## 2021-05-22 ENCOUNTER — Encounter: Payer: Self-pay | Admitting: Registered Nurse

## 2021-05-22 ENCOUNTER — Encounter
Admission: EM | Disposition: A | Discharge status: Nursing Home (Includes ICF) | Payer: Self-pay | Source: Home / Self Care | Attending: Internal Medicine

## 2021-05-22 DIAGNOSIS — A491 Streptococcal infection, unspecified site: Secondary | ICD-10-CM | POA: Diagnosis not present

## 2021-05-22 DIAGNOSIS — R6521 Severe sepsis with septic shock: Secondary | ICD-10-CM | POA: Diagnosis not present

## 2021-05-22 DIAGNOSIS — J189 Pneumonia, unspecified organism: Secondary | ICD-10-CM | POA: Diagnosis not present

## 2021-05-22 DIAGNOSIS — I4891 Unspecified atrial fibrillation: Secondary | ICD-10-CM | POA: Diagnosis not present

## 2021-05-22 DIAGNOSIS — I4892 Unspecified atrial flutter: Secondary | ICD-10-CM | POA: Diagnosis not present

## 2021-05-22 LAB — GLUCOSE, CAPILLARY
Glucose-Capillary: 80 mg/dL (ref 70–99)
Glucose-Capillary: 80 mg/dL (ref 70–99)
Glucose-Capillary: 83 mg/dL (ref 70–99)
Glucose-Capillary: 96 mg/dL (ref 70–99)
Glucose-Capillary: 114 mg/dL — ABNORMAL HIGH (ref 70–99)
Glucose-Capillary: 107 mg/dL — ABNORMAL HIGH (ref 70–99)
Glucose-Capillary: 121 mg/dL — ABNORMAL HIGH (ref 70–99)
Glucose-Capillary: 148 mg/dL — ABNORMAL HIGH (ref 70–99)

## 2021-05-22 LAB — CBC WITH DIFFERENTIAL/PLATELET
Abs Immature Granulocytes: 1.85 10*3/uL — ABNORMAL HIGH (ref 0.00–0.07)
Basophils Absolute: 0.1 10*3/uL (ref 0.0–0.1)
Basophils Relative: 1 %
Eosinophils Absolute: 0.4 10*3/uL (ref 0.0–0.5)
Eosinophils Relative: 2 %
HCT: 24.1 % — ABNORMAL LOW (ref 36.0–46.0)
Hemoglobin: 7.8 g/dL — ABNORMAL LOW (ref 12.0–15.0)
Immature Granulocytes: 11 %
Lymphocytes Relative: 8 %
Lymphs Abs: 1.4 10*3/uL (ref 0.7–4.0)
MCH: 28.2 pg (ref 26.0–34.0)
MCHC: 32.4 g/dL (ref 30.0–36.0)
MCV: 87 fL (ref 80.0–100.0)
Monocytes Absolute: 0.9 10*3/uL (ref 0.1–1.0)
Monocytes Relative: 5 %
Neutro Abs: 12.6 10*3/uL — ABNORMAL HIGH (ref 1.7–7.7)
Neutrophils Relative %: 73 %
Platelets: 228 10*3/uL (ref 150–400)
RBC: 2.77 MIL/uL — ABNORMAL LOW (ref 3.87–5.11)
RDW: 22.4 % — ABNORMAL HIGH (ref 11.5–15.5)
Smear Review: NORMAL
WBC: 17.2 10*3/uL — ABNORMAL HIGH (ref 4.0–10.5)
nRBC: 47 % — ABNORMAL HIGH (ref 0.0–0.2)

## 2021-05-22 LAB — HEPATIC FUNCTION PANEL
ALT: 138 U/L — ABNORMAL HIGH (ref 0–44)
AST: 102 U/L — ABNORMAL HIGH (ref 15–41)
Albumin: 2.4 g/dL — ABNORMAL LOW (ref 3.5–5.0)
Alkaline Phosphatase: 323 U/L — ABNORMAL HIGH (ref 38–126)
Bilirubin, Direct: 1.7 mg/dL — ABNORMAL HIGH (ref 0.0–0.2)
Indirect Bilirubin: 1.1 mg/dL — ABNORMAL HIGH (ref 0.3–0.9)
Total Bilirubin: 2.8 mg/dL — ABNORMAL HIGH (ref 0.3–1.2)
Total Protein: 6.3 g/dL — ABNORMAL LOW (ref 6.5–8.1)

## 2021-05-22 LAB — RENAL FUNCTION PANEL
Albumin: 2.4 g/dL — ABNORMAL LOW (ref 3.5–5.0)
Albumin: 2.2 g/dL — ABNORMAL LOW (ref 3.5–5.0)
Anion gap: 9 (ref 5–15)
Anion gap: 9 (ref 5–15)
BUN: 71 mg/dL — ABNORMAL HIGH (ref 8–23)
BUN: 74 mg/dL — ABNORMAL HIGH (ref 8–23)
CO2: 25 mmol/L (ref 22–32)
CO2: 22 mmol/L (ref 22–32)
Calcium: 9.2 mg/dL (ref 8.9–10.3)
Calcium: 8.6 mg/dL — ABNORMAL LOW (ref 8.9–10.3)
Chloride: 102 mmol/L (ref 98–111)
Chloride: 102 mmol/L (ref 98–111)
Creatinine, Ser: 2.34 mg/dL — ABNORMAL HIGH (ref 0.44–1.00)
Creatinine, Ser: 2.48 mg/dL — ABNORMAL HIGH (ref 0.44–1.00)
GFR, Estimated: 22 mL/min — ABNORMAL LOW (ref >60)
GFR, Estimated: 20 mL/min — ABNORMAL LOW (ref >60)
Glucose, Bld: 94 mg/dL (ref 70–99)
Glucose, Bld: 138 mg/dL — ABNORMAL HIGH (ref 70–99)
Phosphorus: 3.9 mg/dL (ref 2.5–4.6)
Phosphorus: 4 mg/dL (ref 2.5–4.6)
Potassium: 4.3 mmol/L (ref 3.5–5.1)
Potassium: 4.4 mmol/L (ref 3.5–5.1)
Sodium: 136 mmol/L (ref 135–145)
Sodium: 133 mmol/L — ABNORMAL LOW (ref 135–145)

## 2021-05-22 LAB — PROTIME-INR
INR: 1.2 (ref 0.8–1.2)
Prothrombin Time: 15.4 seconds — ABNORMAL HIGH (ref 11.4–15.2)

## 2021-05-22 LAB — MRSA NEXT GEN BY PCR, NASAL: MRSA by PCR Next Gen: NOT DETECTED

## 2021-05-22 LAB — CORTISOL-AM, BLOOD: Cortisol - AM: 22.1 ug/dL (ref 6.7–22.6)

## 2021-05-22 SURGERY — INSERTION, PEG TUBE
Anesthesia: General

## 2021-05-22 MED ORDER — PRISMASOL BGK 0/2.5 32-2.5 MEQ/L EC SOLN
Status: DC
  Filled 2021-05-22 (×11): qty 5000

## 2021-05-22 MED ORDER — SODIUM CHLORIDE 0.9 % FOR CRRT
INTRAVENOUS_CENTRAL | Status: DC | PRN
  Filled 2021-05-22: qty 1000

## 2021-05-22 MED ORDER — HEPARIN SODIUM (PORCINE) 1000 UNIT/ML DIALYSIS
1000.0000 [IU] | INTRAMUSCULAR | Status: DC | PRN
  Administered 2021-05-24 (×2): 1000 [IU] via INTRAVENOUS_CENTRAL
  Administered 2021-05-26: 2600 [IU] via INTRAVENOUS_CENTRAL
  Administered 2021-05-29 (×2): 1300 [IU] via INTRAVENOUS_CENTRAL
  Administered 2021-05-31 – 2021-06-01 (×2): 3000 [IU] via INTRAVENOUS_CENTRAL
  Administered 2021-06-02 (×2): 1400 [IU] via INTRAVENOUS_CENTRAL
  Filled 2021-05-22: qty 6
  Filled 2021-05-22: qty 3
  Filled 2021-05-22: qty 6
  Filled 2021-05-22: qty 3
  Filled 2021-05-22: qty 6
  Filled 2021-05-22: qty 3
  Filled 2021-05-22 (×2): qty 6
  Filled 2021-05-22: qty 3
  Filled 2021-05-22: qty 4
  Filled 2021-05-22: qty 6
  Filled 2021-05-22: qty 4
  Filled 2021-05-22: qty 6
  Filled 2021-05-22: qty 2
  Filled 2021-05-22: qty 6
  Filled 2021-05-22: qty 4
  Filled 2021-05-22: qty 6
  Filled 2021-05-22: qty 3
  Filled 2021-05-22: qty 6

## 2021-05-22 MED ORDER — FREE WATER
30.0000 mL | Status: DC
  Administered 2021-05-22 – 2021-05-28 (×34): 30 mL

## 2021-05-22 MED ORDER — PIVOT 1.5 CAL PO LIQD
1000.0000 mL | ORAL | Status: DC
  Administered 2021-05-22 – 2021-05-23 (×2): 1000 mL
  Filled 2021-05-22: qty 1000

## 2021-05-22 MED ORDER — PRISMASOL BGK 0/2.5 32-2.5 MEQ/L EC SOLN
Status: DC
  Filled 2021-05-22 (×28): qty 5000

## 2021-05-22 NOTE — Progress Notes (Signed)
Progress Note  Patient Name: Alexandra Foster Date of Encounter: 05/22/2021  [facility] Cardiologist: Kathlyn Sacramento, MD   Subjective   Patient remains in [place]. Denies chest pain. Family at bedside.   Inpatient Medications    Scheduled Meds:  amiodarone  200 mg Per Tube Daily   arformoterol  15 mcg Nebulization BID   budesonide (PULMICORT) nebulizer solution  0.25 mg Nebulization BID   chlorhexidine gluconate (MEDLINE KIT)  15 mL Mouth Rinse BID   Chlorhexidine Gluconate Cloth  6 each Topical Q0600   insulin aspart  0-9 Units Subcutaneous Q4H   insulin aspart  4 Units Subcutaneous Q4H   insulin detemir  17 Units Subcutaneous BID   magic mouthwash w/lidocaine  10 mL Oral QID   mouth rinse  15 mL Mouth Rinse 10 times per day   midodrine  10 mg Per Tube TID WC   pantoprazole (PROTONIX) IV  40 mg Intravenous Q24H   povidone-Iodine   Topical Daily   revefenacin  175 mcg Nebulization Daily   sodium chloride flush  10-40 mL Intracatheter Q12H   Continuous Infusions:  sodium chloride 250 mL (05/20/21 1427)   erythromycin Stopped (05/22/21 6144)   norepinephrine (LEVOPHED) Adult infusion 15 mcg/min (05/22/21 1000)   piperacillin-tazobactam (ZOSYN)  IV 3.375 g (05/22/21 1045)   prismasol BGK 2/2.5 dialysis solution     prismasol BGK 2/2.5 replacement solution     prismasol BGK 2/2.5 replacement solution     PRN Meds: acetaminophen, artificial tears, docusate, fentaNYL (SUBLIMAZE) injection, heparin, lip balm, midazolam, ondansetron (ZOFRAN) IV, oxyCODONE, sennosides, sodium chloride, sodium chloride flush   Vital Signs    Vitals:   05/22/21 0800 05/22/21 0817 05/22/21 0900 05/22/21 1000  BP: (!) 110/48 (!) 121/46 (!) 109/49 (!) 113/53  Pulse: 89 90 88 89  Resp: (!) 22 (!) 28 (!) 22 (!) 26  Temp: 100.2 F (37.9 C) 100.2 F (37.9 C) 100.2 F (37.9 C)   TempSrc:      SpO2: 100% 97% 96% 97%  Weight:      Height:        Intake/Output Summary (Last 24 hours) at  05/22/2021 1045 Last data filed at 05/22/2021 1030 Gross per 24 hour  Intake 964.85 ml  Output 386 ml  Net 578.85 ml   Last 3 Weights 05/22/2021 05/21/2021 05/21/2021  Weight (lbs) 257 lb 0.9 oz 258 lb 13.1 oz 258 lb 2.5 oz  Weight (kg) 116.6 kg 117.4 kg 117.1 kg      Telemetry    SR, HR 80s - Personally Reviewed  ECG    No new - Personally Reviewed  Physical Exam   GEN: critically ill Neck: s/p trach Cardiac: RRR, no murmurs, rubs, or gallops.  Respiratory: Clear to auscultation bilaterally. GI: Soft, nontender, non-distended  MS: No edema; left lower leg wound with wound vac Neuro:  Nonfocal  Psych: unable to assess  Labs    High Sensitivity Troponin:   Recent Labs  Lab 05/15/21 1708 05/15/21 1926  TROPONINIHS 338* 305*     Chemistry Recent Labs  Lab 05/17/21 0350 05/17/21 1604 05/19/21 0421 05/19/21 1606 05/20/21 0550 05/20/21 1630 05/21/21 0402 05/22/21 0526  NA 135   < >  --    < >  --  133* 134* 136  K 4.5   < >  --    < >  --  4.7 4.6 4.3  CL 102   < >  --    < >  --  100 101 102  CO2 21*   < >  --    < >  --  _0 GLUCOSE 103*   < >  --    < >  --  238* 134* 94  BUN 44*   < >  --    < >  --  44* 63* 71*  CREATININE 1.38*   < >  --    < >  --  0.93 1.69* 2.34*  CALCIUM 8.3*   < >  --    < >  --  9.1 9.2 9.2  MG 2.4   < > 2.7*  --  2.5*  --  2.6*  --   PROT 6.2*  --   --   --   --   --   --   --   ALBUMIN 1.9*   1.9*   < >  --    < >  --  3.2* 2.9* 2.4*  AST 163*  --   --   --   --   --   --   --   ALT 326*  --   --   --   --   --   --   --   ALKPHOS 452*  --   --   --   --   --   --   --   BILITOT 3.6*  --   --   --   --   --   --   --   GFRNONAA 41*   < >  --    < >  --  >60 32* 22*  ANIONGAP 12   < >  --    < >  --  _1 < > = values in this interval not displayed.    Lipids No results for input(s): CHOL, TRIG, HDL, LABVLDL, LDLCALC, CHOLHDL in the last 168 hours.  Hematology Recent Labs  Lab 05/20/21 0550 05/21/21 0402  05/22/21 0526  WBC 25.0* 22.7* 17.2*  RBC 2.73* 2.58* 2.77*  HGB 7.8* 7.4* 7.8*  HCT 22.5* 22.5* 24.1*  MCV 82.4 87.2 87.0  MCH 28.6 28.7 28.2  MCHC 34.7 32.9 32.4  RDW 20.6* 20.6* 22.4*  PLT 161 166 228   Thyroid No results for input(s): TSH, FREET4 in the last 168 hours.  BNPNo results for input(s): BNP, PROBNP in the last 168 hours.  DDimer No results for input(s): DDIMER in the last 168 hours.   Radiology    DG Chest Port 1 View  Result Date: 05/21/2021 CLINICAL DATA:  [place] EXAM: PORTABLE CHEST 1 VIEW COMPARISON:  05/20/2021 FINDINGS: Tracheostomy in good position. NG tube in the stomach. Right jugular central venous catheter tip in the right atrium unchanged. Left jugular central venous catheter tip at the mid SVC unchanged. Progression of bilateral airspace disease left greater than right. Progression of small pleural effusions. Negative for pneumothorax. IMPRESSION: Support lines unchanged in position Progressive bilateral airspace disease. This likely represents fluid overload however could represent pneumonia. Electronically Signed   By: Franchot Gallo M.D.   On: 05/21/2021 17:39    Cardiac Studies   TTE 05/2021 1. Left ventricular ejection fraction, by estimation, is 60 to 65%. The  left ventricle has normal function. The left ventricle has no regional  wall motion abnormalities. There is mild left ventricular hypertrophy.  Left ventricular diastolic parameters  are consistent with Grade I diastolic dysfunction (impaired relaxation).  2. Right ventricular systolic function is normal. The right ventricular  size is normal. There is mildly elevated pulmonary artery systolic  pressure. The estimated right ventricular systolic pressure is 64.3 mmHg.   3. The mitral valve is normal in structure. No evidence of mitral valve  regurgitation. No evidence of mitral stenosis.   4. The aortic valve is normal in structure. Aortic valve regurgitation is  not visualized. No aortic  stenosis is present.   5. The inferior vena cava is normal in size with greater than 50%  respiratory variability, suggesting right atrial pressure of 3 mmHg.   Patient Profile     71 y.o. female with history of hypertension, diabetes, obesity, COPD admitted with lower extremity cellulitis, bacteremia and shock.  Hospital course complicated by respiratory failure s/p trach, necrotizing fasciitis of the left lower extremity s/p extensive debridement who is being seen for A. fib RVR.  Assessment & Plan    Afib RVR - IV amiodarone transitioned to oral amiodarone today - maintaining NSR - continue 287m daily - heparin held with anemia and small SAH.  - Hgb 7.8, may need repeat transfusion - Echo showed preserved LVEF   Necrotizing fascitis/bacteremia/Septic chock - s/p multiple debridements of lower left leg - on norepi and midodrine - s/p IVIG - wound VAC - abx per ICU   Respiratory failure s/p trach - trach/vent management per respiratory/ICU team   AKI  - on CRRT - nephology following    For questions or updates, please contact [facility] Please consult [email or web address] for contact info under        Signed, Khali Perella HNinfa Meeker PA-C  05/22/2021, 10:45 AM

## 2021-05-22 NOTE — Progress Notes (Addendum)
Nutrition Follow-up  DOCUMENTATION CODES:   Morbid obesity  INTERVENTION:   -Initiate trickle feeds per MD:  Pivot 1.5 @ 20 ml/hr   30 ml free water flush every 4 hours to maintain tube patency  Regimen provides 720 kcals, 45 grams protein, and 360 ml free water. Total free water: 540 ml daily  Once able to advance recommend advance 10 ml/hr every 8 hours to goal rate of 65 ml/hr  Regimen provides 2340kcal/day, 146g/day protein and 1347ml/day of free water   -Continue Renal MVI daily via tube  NUTRITION DIAGNOSIS:   Increased nutrient needs related to wound healing as evidenced by estimated needs.  Progressing   GOAL:   Provide needs based on ASPEN/SCCM guidelines  Ongoing  MONITOR:   Labs, Weight trends, TF tolerance, Skin, I & O's, Vent status  REASON FOR ASSESSMENT:   Rounds    ASSESSMENT:   71 y.o. female with a history of asthma/COPD, HTN, DMII, obesity, OSA on CPAP, GERD and COVID-19 pneumonia who was admitted 1/22 w/ left LE cellulitis, septic shock, S pyogenes bacteremia, hypotension, new Afib and AKI.  Pt s/p I & D 1/24 Pt s/p I & D with veraflow placement 1/27 Pt s/p Left IJ catheter and CRRT 1/28 Pt s/p CT head 1/31; noted to have small SAH  Pt s/p tracheostomy and cellular matrix placement 2/3   Reviewed I/O's: +771 ml x 24 hours and -1.6 L since 05/08/21  UOP: 36 ml x 24 hours  NGT output: 50 ml x 24 hours  Drain output: 100 ml x 24 hours  Rectal tube output: 200 ml x 24 hours   Case discussed with MD, RN, and during ICU rounds.   Pt on trach collar during the day, but requiring vent support at night. She is alert and following commands. She has a shiley #8 trach.   HD started yesterday, but pt did not tolerqate well (almost coded in HD per MD). Pt also with increased pressor requirements. Plan to re-start CRRT today. PEG is on hold secondary to elevated pressor requirements. Per MD, will start trickle feeds today.   Medications  reviewed and include norepinephrine.   Labs reviewed: CBGS: 80-158 (inpatient orders for glycemic control are 0-9 units insulin aspart every 4 hours, 4 units insulin aspart every 4 hours, and 17 units insulin detemir BID).    Diet Order:   Diet Order             Diet NPO time specified  Diet effective midnight                   EDUCATION NEEDS:   Education needs have been addressed  Skin:  Skin Assessment: Reviewed RN Assessment (40 x 35 cm left leg wound, Stage I coccyx, new wounds left arm) Skin Integrity Issues:: Stage I Stage I: coccyx Wound Vac: closed lt leg  Last BM:  05/21/21  Height:   Ht Readings from Last 1 Encounters:  05/18/21 5\' 5"  (1.651 m)    Weight:   Wt Readings from Last 1 Encounters:  05/22/21 116.6 kg    Ideal Body Weight:  56.8 kg  BMI:  Body mass index is 42.78 kg/m.  Estimated Nutritional Needs:   Kcal:  2200-2500kcal/day  Protein:  120-140 grams  Fluid:  1.7-2.0L/day    Loistine Chance, RD, LDN, [place] Registered Dietitian II Certified Diabetes Care and Education Specialist Please refer to [facility] for RD and/or RD on-call/weekend/after hours pager

## 2021-05-22 NOTE — Progress Notes (Signed)
[facility]  PROGRESS NOTE   Subjective:   Intermittent hemodialysis treatment. Poor BFR and increased requirements in vasopressors.   Currently anuric.   Norepinephrine 70mg  Changed to erythromycin and zosyn.   Tmax 100.8   Objective:  Vital signs in last 24 hours:  Temp:  [99.7 F (37.6 C)-100.8 F (38.2 C)] 100.2 F (37.9 C) (02/08 0817) Pulse Rate:  [77-101] 90 (02/08 0817) Resp:  [18-28] 28 (02/08 0817) BP: (104-141)/(46-55) 121/46 (02/08 0817) SpO2:  [97 %-100 %] 97 % (02/08 0817) FiO2 (%):  [28 %-30 %] 28 % (02/08 0817) Weight:  [116.6 kg-117.4 kg] 116.6 kg (02/08 0500)  Weight change: 0 kg Filed Weights   05/21/21 1648 05/21/21 2000 05/22/21 0500  Weight: 117.1 kg 117.4 kg 116.6 kg    Intake/Output: I/O last 3 completed shifts: In: 1735.5 [I.V.:663.9; NG/GT:444.8; IV Piggyback:626.7] Out: 431 [Urine:81; Emesis/NG output:50; Drains:100; SOEHOZ:224]  Intake/Output this shift:  No intake/output data recorded.  Physical Exam: General:  Critically ill  Head:  ETT  Eyes:  Anicteric  Neck:  Trachea collar  Lungs:   Trach collar, diminished bilaterally  Heart:  regular  Abdomen:   Soft, nontender  Extremities:  +peripheral edema. Left lower extremity wound vac  Neurologic:  Off sedation  Skin:  No lesions  Access: Left IJ temp HD catheter    Basic Metabolic Panel: Recent Labs  Lab 05/17/21 0350 05/17/21 1604 05/18/21 0305 05/18/21 1614 05/19/21 0421 05/19/21 1606 05/20/21 0549 05/20/21 0550 05/20/21 1630 05/21/21 0402 05/22/21 0526  NA 135   < > 134*   < >  --  137 134*  --  133* 134* 136  K 4.5   < > 4.4   < >  --  3.6 4.5  --  4.7 4.6 4.3  CL 102   < > 100   < >  --  103 100  --  100 101 102  CO2 21*   < > 23   < >  --  23 24  --  '22 22 25  ' GLUCOSE 103*   < > 197*   < >  --  125* 158*  --  238* 134* 94  BUN 44*   < > 38*   < >  --  55* 36*  --  44* 63* 71*  CREATININE 1.38*   < > 1.12*   < >  --  1.13* 0.90  --  0.93 1.69*  2.34*  CALCIUM 8.3*   < > 8.3*   < >  --  9.2 9.0  --  9.1 9.2 9.2  MG 2.4  --  2.4  --  2.7*  --   --  2.5*  --  2.6*  --   PHOS 3.6   < > 3.4   < >  --  1.5* 5.1*  --  2.5 2.8 3.9   < > = values in this interval not displayed.     CBC: Recent Labs  Lab 05/18/21 0305 05/19/21 0421 05/20/21 0550 05/21/21 0402 05/22/21 0526  WBC 14.6* 25.9* 25.0* 22.7* 17.2*  NEUTROABS  --  18.9* 19.4* 17.5* 12.6*  HGB 8.3* 7.7* 7.8* 7.4* 7.8*  HCT 25.3* 22.6* 22.5* 22.5* 24.1*  MCV 87.2 83.7 82.4 87.2 87.0  PLT 152 127* 161 166 228      Urinalysis: No results for input(s): COLORURINE, LABSPEC, PHURINE, GLUCOSEU, HGBUR, BILIRUBINUR, KETONESUR, PROTEINUR, UROBILINOGEN, NITRITE, LEUKOCYTESUR in the last 72 hours.  Invalid input(s): APPERANCEUR  Imaging: DG Chest Port 1 View  Result Date: 05/21/2021 CLINICAL DATA:  Fever EXAM: PORTABLE CHEST 1 VIEW COMPARISON:  05/20/2021 FINDINGS: Tracheostomy in good position. NG tube in the stomach. Right jugular central venous catheter tip in the right atrium unchanged. Left jugular central venous catheter tip at the mid SVC unchanged. Progression of bilateral airspace disease left greater than right. Progression of small pleural effusions. Negative for pneumothorax. IMPRESSION: Support lines unchanged in position Progressive bilateral airspace disease. This likely represents fluid overload however could represent pneumonia. Electronically Signed   By: Franchot Gallo M.D.   On: 05/21/2021 17:39     Medications:    sodium chloride 250 mL (05/20/21 1427)   erythromycin 100 mL/hr at 05/22/21 0600   norepinephrine (LEVOPHED) Adult infusion 13 mcg/min (05/22/21 0600)   piperacillin-tazobactam (ZOSYN)  IV 12.5 mL/hr at 05/22/21 0600   prismasol BGK 2/2.5 dialysis solution     prismasol BGK 2/2.5 replacement solution     prismasol BGK 2/2.5 replacement solution      amiodarone  200 mg Per Tube Daily   arformoterol  15 mcg Nebulization BID   budesonide  (PULMICORT) nebulizer solution  0.25 mg Nebulization BID   chlorhexidine gluconate (MEDLINE KIT)  15 mL Mouth Rinse BID   Chlorhexidine Gluconate Cloth  6 each Topical Q0600   insulin aspart  0-9 Units Subcutaneous Q4H   insulin aspart  4 Units Subcutaneous Q4H   insulin detemir  17 Units Subcutaneous BID   magic mouthwash w/lidocaine  10 mL Oral QID   mouth rinse  15 mL Mouth Rinse 10 times per day   midodrine  10 mg Per Tube TID WC   pantoprazole (PROTONIX) IV  40 mg Intravenous Q24H   povidone-Iodine   Topical Daily   revefenacin  175 mcg Nebulization Daily   sodium chloride flush  10-40 mL Intracatheter Q12H    Assessment/ Plan:     Principal Problem:   Severe sepsis with septic shock (CODE) (HCC) Active Problems:   COPD (chronic obstructive pulmonary disease) (HCC)   Morbid obesity ([place])   Diabetes mellitus type 2 in obese ([place])   Atrial fibrillation and flutter ([place])   Necrotizing fasciitis ([place])   Acute on chronic respiratory failure ([place])   On mechanically assisted ventilation (HCC)   Shock liver   Anemia associated with acute blood loss   Metabolic acidosis   Encounter for continuous renal replacement therapy (CRRT) for acute renal failure (HCC)   Acute encephalopathy   Group A streptococcal infection   Streptococcal toxic shock syndrome ([place])   Status post tracheostomy ([place])   Subdural hemorrhage ([place])   Left leg cellulitis  Alexandra Foster is a 71 y.o. black female with COPD/asthma, diabetes mellitus type II, hypertension, obstructive sleep apnea, paroxysmal atrial fibrillation, GERD, who presents to [facility] on 05/04/2021 for AKI (acute kidney injury) ([place]) [N17.9] Left leg cellulitis [L03.116] Severe sepsis ([place]) [A41.9, R65.20] Severe sepsis with lactic acidosis ([place]) [A41.9, R65.20, E87.20]   #1: Acute kidney injury: AKI is secondary to ischemic nephropathy secondary to hypotension complicated with sepsis. History of bland urine. Baseline creatinine of  0.98, GFR > 60 on 02/11/21. Anuric urine output.  - Resume continuous renal replacement therapy.    #2: Sepsis: necrotizing fasciitis for left lower extremity status post multiple debridement. Requiring vasopressors. Continue zosyn. Appreciate ID input  - norepinephrine  #3. Anemia with kidney injury: hemoglobin 7.8. EPO given on 2/7.   #4. Acute Respiratory Failure: weaning off mechanical ventilation.  Status post tracheostomy.   Overall prognosis is guarded.  Discussed case with daughter in detail.     LOS: [place], [facility] 2/8/20239:10 AM

## 2021-05-22 NOTE — TOC Progression Note (Signed)
Transition of Care [facility]) - Progression Note    Patient Details  Name: Alexandra Foster MRN: [id number] Date of Birth: 03-Mar-1951  [facility]) CM/SW Contact  Shelbie Hutching, RN Phone Number: 05/22/2021, 10:13 AM  Clinical Narrative:    Patient is tolerating trach collar during the day and then on the vent just on pressure support at night.  Patient did not tolerate HD so CRRT will be restarted today.  Patient is still requiring vasopressors.   Potential plan for LtAC once patient stabilizes.     Expected Discharge Plan: [place] Barriers to Discharge: Continued Medical Work up  Expected Discharge Plan and Services Expected Discharge Plan: [place]   Discharge Planning Services: CM Consult   Living arrangements for the past 2 months: Single Family Home                                       Social Determinants of Health (SDOH) Interventions    Readmission Risk Interventions No flowsheet data found.

## 2021-05-22 NOTE — Consult Note (Addendum)
Alexandra Foster, Alexandra Foster [id number] [birth year] [name], MD  Reason for Consult: Lurline Idol follow-up Requesting Physician: Ottie Glazier, MD Consulting Physician: [name], MD  HPI: This 71 y.o. year old female was admitted on 05/04/2021 for AKI (acute kidney injury) [facility]) [N17.9] Left leg cellulitis [L03.116] Severe sepsis ([place]) [A41.9, R65.20] Severe sepsis with lactic acidosis ([place]) [A41.9, R65.20, E87.20].  Patient is postop day 5 after the tracheostomy.  Currently the ICU team is looking at sources of an increased white count.  Trach wound was questioned as a possible source as well as inflammation of her tongue.  They have been taking her off the vent for periods of time during the day which time the tracheostomy tube cuff is deflated.  They have not seen any air leaks however at night when she is on the vent, and the cuff is up.  Medications:  Current Facility-Administered Medications  Medication Dose Route Frequency Provider Last Rate Last Admin   0.9 %  sodium chloride infusion  250 mL Intravenous Continuous Bennie Pierini, MD 5 mL/hr at 05/22/21 1600 Infusion Verify at 05/22/21 1600   acetaminophen (TYLENOL) tablet 650 mg  650 mg Per NG tube Q6H PRN Ottie Glazier, MD   650 mg at 05/22/21 1551   amiodarone (PACERONE) tablet 200 mg  200 mg Per Tube Daily Darel Hong D, NP   200 mg at 05/22/21 0954   arformoterol (BROVANA) nebulizer solution 15 mcg  15 mcg Nebulization BID Flora Lipps, MD   15 mcg at 05/22/21 0743   artificial tears (LACRILUBE) ophthalmic ointment   Both Eyes Q4H PRN Flora Lipps, MD   Given at 05/16/21 2313   budesonide (PULMICORT) nebulizer solution 0.25 mg  0.25 mg Nebulization BID Rust-[place], Britton L, NP   0.25 mg at 05/22/21 0743   chlorhexidine gluconate (MEDLINE KIT) (PERIDEX) 0.12 % solution 15 mL  15 mL Mouth Rinse BID Tyler Pita, MD   15 mL at 05/22/21 1200   Chlorhexidine Gluconate Cloth 2 % PADS 6 each  6 each Topical Q0600 Bennie Pierini, MD   6 each at 05/21/21 1134   docusate (COLACE) 50 MG/5ML liquid 100 mg  100 mg Per Tube BID PRN Teressa Lower, NP       erythromycin 250 mg in sodium chloride 0.9 % 100 mL IVPB  250 mg Intravenous Q8H Teressa Lower, NP   Stopped at 05/22/21 1557   feeding supplement (PIVOT 1.5 CAL) liquid 1,000 mL  1,000 mL Per Tube Continuous Ottie Glazier, MD 20 mL/hr at 05/22/21 1400 1,000 mL at 05/22/21 1400   fentaNYL (SUBLIMAZE) injection 25-50 mcg  25-50 mcg Intravenous Q2H PRN Ottie Glazier, MD   50 mcg at 05/21/21 1148   free water 30 mL  30 mL Per Tube Q4H Aleskerov, Fuad, MD   30 mL at 05/22/21 1300   heparin injection 1,000-6,000 Units  1,000-6,000 Units CRRT PRN Kolluru, Sarath, MD       insulin aspart (novoLOG) injection 0-9 Units  0-9 Units Subcutaneous Q4H Ottie Glazier, MD   1 Units at 05/21/21 2337   insulin aspart (novoLOG) injection 4 Units  4 Units Subcutaneous Q4H Ottie Glazier, MD   4 Units at 05/21/21 2337   insulin detemir (LEVEMIR) injection 17 Units  17 Units Subcutaneous BID Ottie Glazier, MD   17 Units at 05/21/21 2128   lip balm (BLISTEX) ointment   Topical PRN Flora Lipps, MD   Given at 05/14/21 2205   magic mouthwash  w/lidocaine  10 mL Oral QID Teressa Lower, NP   10 mL at 05/22/21 1554   MEDLINE mouth rinse  15 mL Mouth Rinse 10 times per day Tyler Pita, MD   15 mL at 05/22/21 1000   midazolam (VERSED) injection 2 mg  2 mg Intravenous Q1H PRN Rust-[place]ster, Huel Cote, NP       midodrine (PROAMATINE) tablet 10 mg  10 mg Per Tube TID WC Flora Lipps, MD   10 mg at 05/22/21 1316   norepinephrine (LEVOPHED) 16 mg in 235m premix infusion  0-40 mcg/min Intravenous Titrated Kolluru, Sarath, MD 13.13 mL/hr at 05/22/21 1600 14 mcg/min at 05/22/21 1600   ondansetron (ZOFRAN) injection 4 mg  4 mg Intravenous Q6H PRN GTyler Pita MD   4 mg at 05/20/21 03500  oxyCODONE (Oxy IR/ROXICODONE) immediate release tablet 5-10 mg  5-10 mg Per Tube Q6H PRN AOttie Glazier MD   10 mg at 05/20/21 1057   pantoprazole (PROTONIX) injection 40 mg  40 mg Intravenous Q24H Rust-[place], Britton L, NP   40 mg at 05/22/21 0537   piperacillin-tazobactam (ZOSYN) IVPB 3.375 g  3.375 g Intravenous QIsabel Caprice MD   Stopped at 05/22/21 1456   povidone-Iodine (BETADINE) 5 % topical solution   Topical Daily KBradly Bienenstock NP   Given at 05/22/21 1500   prismasol BGK 2/2.5 dialysis solution   CRRT Continuous Kolluru, Sarath, MD 1,000 mL/hr at 05/22/21 1304 New Bag at 05/22/21 1304   prismasol BGK 2/2.5 replacement solution   CRRT Continuous Kolluru, Sarath, MD 400 mL/hr at 05/22/21 1304 New Bag at 05/22/21 1304   prismasol BGK 2/2.5 replacement solution   CRRT Continuous Kolluru, Sarath, MD 400 mL/hr at 05/22/21 1304 New Bag at 05/22/21 1304   revefenacin (YUPELRI) nebulizer solution 175 mcg  175 mcg Nebulization Daily KFlora Lipps MD   175 mcg at 05/22/21 0743   sennosides (SENOKOT) 8.8 MG/5ML syrup 10 mL  10 mL Per Tube QHS PRN Tukov-Yual, Magdalene S, NP   10 mL at 05/14/21 2105   sodium chloride 0.9 % primer fluid for CRRT   CRRT PRN Kolluru, SLurena Nida MD       sodium chloride flush (NS) 0.9 % injection 10-40 mL  10-40 mL Intracatheter Q12H AKathlyn SacramentoA, MD   10 mL at 05/22/21 1000   sodium chloride flush (NS) 0.9 % injection 10-40 mL  10-40 mL Intracatheter PRN AWellington Hampshire MD      .  Medications Prior to Admission  Medication Sig Dispense Refill   albuterol (PROVENTIL) (2.5 MG/3ML) 0.083% nebulizer solution Take 3 mLs (2.5 mg total) by nebulization every 6 (six) hours as needed for wheezing or shortness of breath. 150 mL 1   aspirin 81 MG tablet Take 1 tablet (81 mg total) by mouth daily. 30 tablet 0   atorvastatin (LIPITOR) 40 MG tablet Take 1 tablet (40 mg total) by mouth daily. 90 tablet 1   azelastine (ASTELIN) 0.1 % nasal spray Place 2 sprays into both nostrils 2 (two) times daily as needed for rhinitis or allergies. 30 mL 1   baclofen  (LIORESAL) 10 MG tablet Take 10 mg by mouth daily.     celecoxib (CELEBREX) 200 MG capsule Take 200 mg by mouth daily.     Cetirizine HCl (ZYRTEC ALLERGY) 10 MG TBDP Take 10 mg by mouth at bedtime. For sinus symptoms and seasonal allergies 30 tablet 1   docusate sodium (COLACE) 100 MG capsule Take  100 mg by mouth daily.     fluticasone (FLONASE) 50 MCG/ACT nasal spray      glucose blood (ACCU-CHEK AVIVA PLUS) test strip Use as instructed 100 each 12   Lancets (ACCU-CHEK SOFT TOUCH) lancets Use as instructed 100 each 12   meloxicam (MOBIC) 15 MG tablet Take 15 mg by mouth daily.     metFORMIN (GLUCOPHAGE-XR) 750 MG 24 hr tablet Take 1 tablet (750 mg total) by mouth daily with breakfast. 90 tablet 1   montelukast (SINGULAIR) 10 MG tablet Take 1 tablet (10 mg total) by mouth at bedtime. 90 tablet 1   Multiple Vitamins-Minerals (MULTIVITAL PO) Take 1 tablet by mouth daily.     omeprazole (PRILOSEC) 20 MG capsule Take 1 capsule (20 mg total) by mouth daily. 90 capsule 1   ORTHOVISC 30 MG/2ML SOSY      triamterene-hydrochlorothiazide (DYAZIDE) 37.5-25 MG capsule TAKE 1 CAPSULE EVERY DAY 90 capsule 1   vitamin B-12 (CYANOCOBALAMIN) 100 MCG tablet Take 100 mcg by mouth daily.     calcium carbonate (OSCAL) 1500 (600 Ca) MG TABS tablet Take 600 mg of elemental calcium by mouth 2 (two) times daily with a meal. (Patient not taking: Reported on 05/05/2021)     Fluticasone-Umeclidin-Vilant (TRELEGY ELLIPTA) 100-62.5-25 MCG/INH AEPB Inhale 1 puff into the lungs daily. In place of Stiolto 180 each 1   loratadine (CLARITIN) 10 MG tablet Take 1 tablet (10 mg total) by mouth daily. 90 tablet 1    Allergies: No Known Allergies  PMH:  Past Medical History:  Diagnosis Date   AKI (acute kidney injury) ([place])    a. 04/2021 in setting of bacteremia/shock.   Arthritis    Asthma    Bacteremia    a. 04/2021 S pyogenes bacteremia in setting of lower ext cellulitis.   COPD (chronic obstructive pulmonary disease) (HCC)     Diabetes mellitus without complication (HCC)    Endometriosis    GERD (gastroesophageal reflux disease)    History of echocardiogram    a. 07/2013 Echo: EF 55-60%, impaired relaxation, mild TR; b. 04/2021 Echo: EF 50-55%, mild LVH, nl RV fxn, mild BAE, Ao sclerosis w/o stenosis.   Hypertension    Obesity    PAF (paroxysmal atrial fibrillation) ([place])    a. 04/2021 in setting of septic shock/cellulitis.   Sleep apnea    CPAP    Fam Hx:  Family History  Problem Relation Age of Onset   Congestive Heart Failure Mother    Coronary artery disease Father 51   Breast cancer Sister 27   Heart disease Brother    Varicose Veins Brother    Alcohol abuse Brother     Soc Hx:  Social History   Socioeconomic History   Marital status: Married    Spouse name: Trilby Drummer   Number of children: 1   Years of education: 12   Highest education level: 12th grade  Occupational History    Employer: RETIRED  Tobacco Use   Smoking status: Former    Packs/day: 2.00    Years: 40.00    Pack years: 80.00    Types: Cigarettes    Quit date: 2003    Years since quitting: 20.1   Smokeless tobacco: Former    Types: Snuff    Quit date: 04/2001   Tobacco comments:    smoking cessation materials not required  Vaping Use   Vaping Use: Never used  Substance and Sexual Activity   Alcohol use: No    Alcohol/week: 0.0  standard drinks   Drug use: No   Sexual activity: Not Currently  Other Topics Concern   Not on file  Social History Narrative   Lives locally w/ husband.  Does not routinely exercise.   Social Determinants of Health   Financial Resource Strain: Medium Risk   Difficulty of Paying Living Expenses: Somewhat hard  Food Insecurity: No Food Insecurity   Worried About Charity fundraiser in the Last Year: Never true   Ran Out of Food in the Last Year: Never true  Transportation Needs: No Transportation Needs   Lack of Transportation (Medical): No   Lack of Transportation (Non-Medical): No   Physical Activity: Inactive   Days of Exercise per Week: 0 days   Minutes of Exercise per Session: 0 min  Stress: No Stress Concern Present   Feeling of Stress : Not at all  Social Connections: Moderately Integrated   Frequency of Communication with Friends and Family: More than three times a week   Frequency of Social Gatherings with Friends and Family: More than three times a week   Attends Religious Services: More than 4 times per year   Active Member of Clubs or Organizations: No   Attends Archivist Meetings: Never   Marital Status: Married  Human resources officer Violence: Not At Risk   Fear of Current or Ex-Partner: No   Emotionally Abused: No   Physically Abused: No   Sexually Abused: No    PSH:  Past Surgical History:  Procedure Laterality Date   ABDOMINAL HYSTERECTOMY     APPLICATION OF WOUND VAC  05/10/2021   Procedure: APPLICATION OF WOUND VAC;  Surgeon: Herbert Pun, MD;  Location: [facility] ORS;  Service: General;;   APPLICATION OF WOUND VAC Left 05/17/2021   Procedure: APPLICATION OF WOUND VAC/WOUND VAC EXCHANGE-[employer];  Surgeon: Herbert Pun, MD;  Location: [facility] ORS;  Service: General;  Laterality: Left;   COLONOSCOPY  10/29/2006   Dr Allen Norris   COLONOSCOPY WITH PROPOFOL N/A 11/05/2016   Procedure: COLONOSCOPY WITH PROPOFOL;  Surgeon: Robert Bellow, MD;  Location: [facility] ENDOSCOPY;  Service: Endoscopy;  Laterality: N/A;   INCISION AND DRAINAGE OF WOUND Left 05/10/2021   Procedure: IRRIGATION AND DEBRIDEMENT LEFT LEG;  Surgeon: Herbert Pun, MD;  Location: [facility] ORS;  Service: General;  Laterality: Left;   NASAL SINUS SURGERY  2002   Dr Carlis Abbott   TRACHEOSTOMY TUBE PLACEMENT N/A 05/17/2021   Procedure: TRACHEOSTOMY;  Surgeon: Clyde Canterbury, MD;  Location: [facility] ORS;  Service: ENT;  Laterality: N/A;   WOUND DEBRIDEMENT Left 05/07/2021   Procedure: DEBRIDEMENT WOUND;  Surgeon: Herbert Pun, MD;  Location: [facility] ORS;  Service: General;   Laterality: Left;  . Procedures since admission: No admission procedures for hospital encounter.  ROS: Review of systems normal other than 12 systems except per HPI.  PHYSICAL EXAM  Vitals: Blood pressure (!) 112/52, pulse 89, temperature (!) 100.6 F (38.1 C), resp. rate (!) 22, height _0  (1.651 m), weight 116.6 kg, SpO2 99 %.. General: Critically ill sedated patient in no acute distress. Mood: Patient is cooperative. Orientation: Patient is not alert or oriented. head and Face: NCAT. No facial asymmetry. No visible skin lesions. No significant facial scars. No tenderness with sinus percussion. Facial strength normal and symmetric. Ears: External ears with normal landmarks, no lesions. External auditory canals free of infection, cerumen impaction or lesions. Tympanic membranes intact with bilateral serous otitis media but no pus in the middle ear. Nose: External nose normal with midline  dorsum and no lesions or deformity. Nasal Cavity reveals essentially midline septum with normal inferior turbinates.  There is no evidence of infection.  Postsurgical changes from sinus surgery can be partially visualized with small polyps noted in the nasal cavity.   Oral Cavity/ Oropharynx: Lips are normal with no lesions.  She has some edema of the tongue and exudates where the teeth have abraded the tongue.  She is partially edentulous but no frank dental caries are visualized. Indirect Laryngoscopy/Nasopharyngoscopy: Visualization of the larynx, hypopharynx and nasopharynx is not possible in this setting with routine examination. Neck: Supple and symmetric with no palpable masses, tenderness or crepitance. The tracheostomy tube is in place.  The wound is essentially the same size as it was at the time of surgery, but she is slow to granulate in and respiratory secretions are noted in the wound bed .  There is some exudate over the soft tissues but no evidence of any serious infection.    MEDICAL DECISION  MAKING: Data Review:  Results for orders placed or performed during the hospital encounter of 05/04/21 (from the past 48 hour(s))  Glucose, capillary     Status: Abnormal   Collection Time: 05/20/21  7:08 PM  Result Value Ref Range   Glucose-Capillary 182 (H) 70 - 99 mg/dL    Comment: Glucose reference range applies only to samples taken after fasting for at least 8 hours.  Glucose, capillary     Status: Abnormal   Collection Time: 05/20/21 11:20 PM  Result Value Ref Range   Glucose-Capillary 156 (H) 70 - 99 mg/dL    Comment: Glucose reference range applies only to samples taken after fasting for at least 8 hours.   Comment 1 Notify RN    Comment 2 Document in Chart   Glucose, capillary     Status: Abnormal   Collection Time: 05/21/21  4:01 AM  Result Value Ref Range   Glucose-Capillary 131 (H) 70 - 99 mg/dL    Comment: Glucose reference range applies only to samples taken after fasting for at least 8 hours.  CBC with Differential/Platelet     Status: Abnormal   Collection Time: 05/21/21  4:02 AM  Result Value Ref Range   WBC 22.7 (H) 4.0 - 10.5 K/uL    Comment: WHITE COUNT CONFIRMED ON SMEAR   RBC 2.58 (L) 3.87 - 5.11 MIL/uL   Hemoglobin 7.4 (L) 12.0 - 15.0 g/dL   HCT 22.5 (L) 36.0 - 46.0 %   MCV 87.2 80.0 - 100.0 fL   MCH 28.7 26.0 - 34.0 pg   MCHC 32.9 30.0 - 36.0 g/dL   RDW 20.6 (H) 11.5 - 15.5 %   Platelets 166 150 - 400 K/uL   nRBC 48.4 (H) 0.0 - 0.2 %   Neutrophils Relative % 76 %   Neutro Abs 17.5 (H) 1.7 - 7.7 K/uL   Lymphocytes Relative 7 %   Lymphs Abs 1.6 0.7 - 4.0 K/uL   Monocytes Relative 3 %   Monocytes Absolute 0.7 0.1 - 1.0 K/uL   Eosinophils Relative 2 %   Eosinophils Absolute 0.3 0.0 - 0.5 K/uL   Basophils Relative 1 %   Basophils Absolute 0.2 (H) 0.0 - 0.1 K/uL   WBC Morphology      MODERATE LEFT SHIFT (>5% METAS AND MYELOS,OCC PRO NOTED)   Smear Review Normal platelet morphology    Immature Granulocytes 11 %   Abs Immature Granulocytes 2.41 (H)  0.00 - 0.07 K/uL   Polychromasia PRESENT  Target Cells PRESENT     Comment: Performed at [facility], [address] [place], [place] [address]  Renal function panel     Status: Abnormal   Collection Time: 05/21/21  4:02 AM  Result Value Ref Range   Sodium 134 (L) 135 - 145 mmol/L   Potassium 4.6 3.5 - 5.1 mmol/L   Chloride 101 98 - 111 mmol/L   CO2 22 22 - 32 mmol/L   Glucose, Bld 134 (H) 70 - 99 mg/dL    Comment: Glucose reference range applies only to samples taken after fasting for at least 8 hours.   BUN 63 (H) 8 - 23 mg/dL   Creatinine, Ser 1.69 (H) 0.44 - 1.00 mg/dL   Calcium 9.2 8.9 - 10.3 mg/dL   Phosphorus 2.8 2.5 - 4.6 mg/dL   Albumin 2.9 (L) 3.5 - 5.0 g/dL   GFR, Estimated 32 (L) >60 mL/min    Comment: (NOTE) Calculated using the CKD-EPI Creatinine Equation (2021)    Anion gap 11 5 - 15    Comment: Performed at [facility], [address] [place], [place] [address]  Magnesium     Status: Abnormal   Collection Time: 05/21/21  4:02 AM  Result Value Ref Range   Magnesium 2.6 (H) 1.7 - 2.4 mg/dL    Comment: Performed at [facility], [address] [place], [place] [address]  Glucose, capillary     Status: Abnormal   Collection Time: 05/21/21  7:22 AM  Result Value Ref Range   Glucose-Capillary 108 (H) 70 - 99 mg/dL    Comment: Glucose reference range applies only to samples taken after fasting for at least 8 hours.  Glucose, capillary     Status: Abnormal   Collection Time: 05/21/21 11:12 AM  Result Value Ref Range   Glucose-Capillary 149 (H) 70 - 99 mg/dL    Comment: Glucose reference range applies only to samples taken after fasting for at least 8 hours.  Glucose, capillary     Status: Abnormal   Collection Time: 05/21/21  3:34 PM  Result Value Ref Range   Glucose-Capillary 182 (H) 70 - 99 mg/dL    Comment: Glucose reference range applies only to samples taken after fasting for at least 8 hours.  Hepatitis B surface antigen      Status: None   Collection Time: 05/21/21  4:33 PM  Result Value Ref Range   Hepatitis B Surface Ag NON REACTIVE NON REACTIVE    Comment: Performed at [facility] [address] [place], [place] [address]  Hepatitis B surface antibody     Status: Abnormal   Collection Time: 05/21/21  4:33 PM  Result Value Ref Range   Hep B S Ab Reactive (A) NON REACTIVE    Comment: (NOTE) Consistent with immunity, greater than 9.9 mIU/mL.  Performed at [facility], [place] [address], [place], [place] [address]   Aerobic/Anaerobic Culture w Gram Stain (surgical/deep wound)     Status: None (Preliminary result)   Collection Time: 05/21/21  5:09 PM   Specimen: Neck; Wound  Result Value Ref Range   Specimen Description      NECK Performed at [facility], [address], [place],  [address]    Special Requests      NONE Performed at [facility], [address] [place], [place] [address]    Gram Stain      MODERATE WBC PRESENT,BOTH PMN AND MONONUCLEAR NO ORGANISMS SEEN  Performed at [facility], [place] [address], [place], [place] [address]    Culture PENDING    Report Status PENDING   Glucose, capillary     Status: Abnormal   Collection Time: 05/21/21  7:20 PM  Result Value Ref Range   Glucose-Capillary 158 (H) 70 - 99 mg/dL    Comment: Glucose reference range applies only to samples taken after fasting for at least 8 hours.   Comment 1 Notify RN    Comment 2 Document in Chart   Glucose, capillary     Status: Abnormal   Collection Time: 05/21/21 11:09 PM  Result Value Ref Range   Glucose-Capillary 148 (H) 70 - 99 mg/dL    Comment: Glucose reference range applies only to samples taken after fasting for at least 8 hours.   Comment 1 Notify RN    Comment 2 Document in Chart   Glucose, capillary     Status: None   Collection Time: 05/22/21  3:29 AM  Result Value Ref Range   Glucose-Capillary 80 70 - 99 mg/dL    Comment: Glucose reference range  applies only to samples taken after fasting for at least 8 hours.   Comment 1 Notify RN    Comment 2 Document in Chart   CBC with Differential/Platelet     Status: Abnormal   Collection Time: 05/22/21  5:26 AM  Result Value Ref Range   WBC 17.2 (H) 4.0 - 10.5 K/uL    Comment: WHITE COUNT CONFIRMED ON SMEAR   RBC 2.77 (L) 3.87 - 5.11 MIL/uL   Hemoglobin 7.8 (L) 12.0 - 15.0 g/dL   HCT 24.1 (L) 36.0 - 46.0 %   MCV 87.0 80.0 - 100.0 fL   MCH 28.2 26.0 - 34.0 pg   MCHC 32.4 30.0 - 36.0 g/dL   RDW 22.4 (H) 11.5 - 15.5 %   Platelets 228 150 - 400 K/uL   nRBC 47.0 (H) 0.0 - 0.2 %   Neutrophils Relative % 73 %   Neutro Abs 12.6 (H) 1.7 - 7.7 K/uL   Lymphocytes Relative 8 %   Lymphs Abs 1.4 0.7 - 4.0 K/uL   Monocytes Relative 5 %   Monocytes Absolute 0.9 0.1 - 1.0 K/uL   Eosinophils Relative 2 %   Eosinophils Absolute 0.4 0.0 - 0.5 K/uL   Basophils Relative 1 %   Basophils Absolute 0.1 0.0 - 0.1 K/uL   WBC Morphology MILD LEFT SHIFT (1-5% METAS, OCC MYELO, OCC BANDS)    Smear Review Normal platelet morphology    Immature Granulocytes 11 %   Abs Immature Granulocytes 1.85 (H) 0.00 - 0.07 K/uL   Polychromasia PRESENT    Target Cells PRESENT     Comment: Performed at [facility], [address] [place], [place] [address]  Renal function panel     Status: Abnormal   Collection Time: 05/22/21  5:26 AM  Result Value Ref Range   Sodium 136 135 - 145 mmol/L   Potassium 4.3 3.5 - 5.1 mmol/L   Chloride 102 98 - 111 mmol/L   CO2 25 22 - 32 mmol/L   Glucose, Bld 94 70 - 99 mg/dL    Comment: Glucose reference range applies only to samples taken after fasting for at least 8 hours.   BUN 71 (H) 8 - 23 mg/dL   Creatinine, Ser 2.34 (H) 0.44 - 1.00 mg/dL   Calcium 9.2 8.9 - 10.3 mg/dL   Phosphorus 3.9 2.5 - 4.6 mg/dL  Albumin 2.4 (L) 3.5 - 5.0 g/dL   GFR, Estimated 22 (L) >60 mL/min    Comment: (NOTE) Calculated using the CKD-EPI Creatinine Equation (2021)    Anion gap 9 5 - 15     Comment: Performed at [facility], [address] [place], [place]  [address]  Protime-INR     Status: Abnormal   Collection Time: 05/22/21  5:26 AM  Result Value Ref Range   Prothrombin Time 15.4 (H) 11.4 - 15.2 seconds   INR 1.2 0.8 - 1.2    Comment: (NOTE) INR goal varies based on device and disease states. Performed at [facility], [address] [place], [place] [address]   Glucose, capillary     Status: None   Collection Time: 05/22/21  7:23 AM  Result Value Ref Range   Glucose-Capillary 80 70 - 99 mg/dL    Comment: Glucose reference range applies only to samples taken after fasting for at least 8 hours.  Cortisol-am, blood     Status: None   Collection Time: 05/22/21 11:04 AM  Result Value Ref Range   Cortisol - AM 22.1 6.7 - 22.6 ug/dL    Comment: Performed at [facility], [place] [address] [place], [place] [address]  Hepatic function panel     Status: Abnormal   Collection Time: 05/22/21 11:04 AM  Result Value Ref Range   Total Protein 6.3 (L) 6.5 - 8.1 g/dL   Albumin 2.4 (L) 3.5 - 5.0 g/dL   AST 102 (H) 15 - 41 U/L   ALT 138 (H) 0 - 44 U/L   Alkaline Phosphatase 323 (H) 38 - 126 U/L   Total Bilirubin 2.8 (H) 0.3 - 1.2 mg/dL   Bilirubin, Direct 1.7 (H) 0.0 - 0.2 mg/dL   Indirect Bilirubin 1.1 (H) 0.3 - 0.9 mg/dL    Comment: Performed at [facility], [address] [place], [place] [address]  Glucose, capillary     Status: None   Collection Time: 05/22/21 11:31 AM  Result Value Ref Range   Glucose-Capillary 83 70 - 99 mg/dL    Comment: Glucose reference range applies only to samples taken after fasting for at least 8 hours.  Glucose, capillary     Status: None   Collection Time: 05/22/21  2:48 PM  Result Value Ref Range   Glucose-Capillary 96 70 - 99 mg/dL    Comment: Glucose reference range applies only to samples taken after fasting for at least 8 hours.  Darletta Moll Chest Port 1 View  Result Date: 05/21/2021 CLINICAL DATA:   Fever EXAM: PORTABLE CHEST 1 VIEW COMPARISON:  05/20/2021 FINDINGS: Tracheostomy in good position. NG tube in the stomach. Right jugular central venous catheter tip in the right atrium unchanged. Left jugular central venous catheter tip at the mid SVC unchanged. Progression of bilateral airspace disease left greater than right. Progression of small pleural effusions. Negative for pneumothorax. IMPRESSION: Support lines unchanged in position Progressive bilateral airspace disease. This likely represents fluid overload however could represent pneumonia. Electronically Signed   By: Franchot Gallo M.D.   On: 05/21/2021 17:39  .   ASSESSMENT: Tracheostomy wound is slow to granulate though does not have a markedly abnormal appearance for postop day 5.  Certainly her debilitated state likely contributes to the slow healing. I am not concerned about this wound as a source of an elevated white count.  Exudate in an open wound like this exposed to respiratory secretions is absolutely normal.  It is  possible that letting the cuff down might also be impeding some of the progress with the wound granulating in by causing some wound contamination however.  I also do not think the superficial exudates on the tongue would be likely to cause a significantly elevated white count.  Fortunately her white count is trending down some from yesterday.  Pulmonary sources are probably more likely cause of the white count, given the CXR results from 2/7.  PLAN: Discussed wound care with the nursing staff.  I would probably switch to wet-to-dry dressings just with saline.  I do not think the Betadine probably adds much and could potentially slow healing to some degree.  The key thing with the dressing is trying to keep the trach flange off of the wound bed and adjacent skin.  It may also be best to keep her tracheostomy cuff up for a period of time until this wound begins to granulate more because of the potential for wound contamination  from respiratory secretions, which could slow healing. Nursing staff should continue oral care as best they can.    [name], MD 05/22/2021 4:36 PM

## 2021-05-22 NOTE — Progress Notes (Signed)
I  Date of Admission:  05/04/2021     ID: Alexandra Foster is a 71 y.o. female  Principal Problem:   Severe sepsis with septic shock (CODE) ([place]) Active Problems:   COPD (chronic obstructive pulmonary disease) (HCC)   Morbid obesity ([place])   Diabetes mellitus type 2 in obese ([place])   Atrial fibrillation and flutter (HCC)   Necrotizing fasciitis ([place])   Acute on chronic respiratory failure ([place])   On mechanically assisted ventilation ([place])   Shock liver   Anemia associated with acute blood loss   Metabolic acidosis   Encounter for continuous renal replacement therapy (CRRT) for acute renal failure (HCC)   Acute encephalopathy   Group A streptococcal infection   Streptococcal toxic shock syndrome ([place])   Status post tracheostomy ([place])   Subdural hemorrhage (HCC)   Left leg cellulitis    Subjective: Opens eyes to calling her name Following simple commands like squeezing fingers  Medications:   amiodarone  200 mg Per Tube Daily   arformoterol  15 mcg Nebulization BID   budesonide (PULMICORT) nebulizer solution  0.25 mg Nebulization BID   chlorhexidine gluconate (MEDLINE KIT)  15 mL Mouth Rinse BID   Chlorhexidine Gluconate Cloth  6 each Topical Q0600   insulin aspart  0-9 Units Subcutaneous Q4H   insulin aspart  4 Units Subcutaneous Q4H   insulin detemir  17 Units Subcutaneous BID   magic mouthwash w/lidocaine  10 mL Oral QID   mouth rinse  15 mL Mouth Rinse 10 times per day   midodrine  10 mg Per Tube TID WC   pantoprazole (PROTONIX) IV  40 mg Intravenous Q24H   povidone-Iodine   Topical Daily   revefenacin  175 mcg Nebulization Daily   sodium chloride flush  10-40 mL Intracatheter Q12H    Objective: Vital signs in last 24 hours: Temp:  [99.7 F (37.6 C)-100.8 F (38.2 C)] 100.2 F (37.9 C) (02/08 0900) Pulse Rate:  [77-101] 89 (02/08 1000) Resp:  [18-28] 26 (02/08 1000) BP: (104-141)/(46-55) 113/53 (02/08 1000) SpO2:  [96 %-100 %] 97 % (02/08 1000) FiO2 (%):   [28 %-30 %] 28 % (02/08 0817) Weight:  [116.6 kg-117.4 kg] 116.6 kg (02/08 0500)  PHYSICAL EXAM:  General: On calling the name patient opens her eyes.  She does not talk.  But can squeeze my fingers as commanded Not in distress  Head: Normocephalic, without obvious abnormality, atraumatic. Eyes: Conjunctivae clear, anicteric sclerae. Pupils are equal ENT t- tongue tip looks ischemic with eschar Neck: Tracheostomy - gelatinous fluid no carotid bruit and no JVD.  Lungs: Bilateral air entry  heart: Regular rate and rhythm, no murmur, rub or gallop. Abdomen: Soft, non-tender,not distended. Bowel sounds normal. No masses Extremities: Left leg has wound VAC Picture from today reviewed     Lymph: Cervical, supraclavicular normal. Neurologic: Cannot be assessed Bilateral IJ lines.  On CRRT NG tube on tube feeds Foley catheter  anuria   Lab Results Recent Labs    05/21/21 0402 05/22/21 0526  WBC 22.7* 17.2*  HGB 7.4* 7.8*  HCT 22.5* 24.1*  NA 134* 136  K 4.6 4.3  CL 101 102  CO2 22 25  BUN 63* 71*  CREATININE 1.69* 2.34*   Liver Panel Recent Labs    05/21/21 0402 05/22/21 0526  ALBUMIN 2.9* 2.4*   Sedimentation Rate No results for input(s): ESRSEDRATE in the last 72 hours. C-Reactive Protein No results for input(s): CRP in the last 72 hours.  Microbiology:  BC 05/04/21- GAS 05/07/21- Wound culture NG 05/09/21 BC -NG Studies/Results:   DG Chest Port 1 View  Result Date: 05/21/2021 CLINICAL DATA:  Fever EXAM: PORTABLE CHEST 1 VIEW COMPARISON:  05/20/2021 FINDINGS: Tracheostomy in good position. NG tube in the stomach. Right jugular central venous catheter tip in the right atrium unchanged. Left jugular central venous catheter tip at the mid SVC unchanged. Progression of bilateral airspace disease left greater than right. Progression of small pleural effusions. Negative for pneumothorax. IMPRESSION: Support lines unchanged in position Progressive bilateral airspace  disease. This likely represents fluid overload however could represent pneumonia. Electronically Signed   By: Franchot Gallo M.D.   On: 05/21/2021 17:39     Assessment/Plan:  Group A streptococcus bacteremia with invasive infection with septic shock, multiorgan failure.  Necrotizing infection of the left leg.  Status post debridement.  Has wound VAC Patient completed 15 days of appropriate antibiotics   She also received 3 doses of IVIG.  Leukocytosis.    improving.  Likely due to pneumonia VS eschar tip of tongue  VS tracheostomy site-  Culture sent On zosyn Recommend removing foley as she is anuric and doing daily bladder scans   Encephalopathy: Improved Seizures on Keppra Small subdural hemorrhage.  Normal MRA  AKI on CRRT  Acute hypoxic respiratory failure.  Tracheostomy and is on the vent  Shock liver with transaminitis improving  Anemia due to infection and blood loss from the left leg.  Has received multiple units of PRBC  A-fib now in sinus rhythm.  On amiodarone.  Followed by cardiology.  Anasarca.  Much better  Discussed management with the care team.

## 2021-05-22 NOTE — Progress Notes (Signed)
Patient started on CRRT, no complications.

## 2021-05-22 NOTE — Progress Notes (Signed)
NAME:  Alexandra Foster, MRN:  [id number], DOB:  Apr 22, 1950, LOS: 41 ADMISSION DATE:  05/04/2021  History of present Illness:  71 y.o female with medical history significant of Asthma/COPD, T2DM, HTN, OSA on CPAP, GERD, COVID-19 pneumonia, and Bilateral lower extremity edema who presented to the ED with chief complaint of LLE pain and chills since Thursday.  Patient was given 30 cc/kg of fluids and started on broad-spectrum antibiotics for sepsis with septic shock.   Admitted to the ICU with severe sepsis with shock secondary to cellulitis of LLE septic shock with LLL necrotizing fascitis post op resp failure and severe septic shock with progressive renal failure on CRRT and progressive multiorgan failure  MRI/CT SHOWS SMALL SAH  -05/20/21- Patient is improved she is able to follow communication verbally and move extermities. She is on 5L/min Trache collar. We have stopped levophed and working on reducing vasopressin. She remains on CRRT.  Insulin infusion is ongoing and plan to have weight based regimen initiated. Additionally she will have SLP for vocalization and PMV trial as well as ROM training with OT/PT.  Multiple specialists on case appreciate everyone involved.   05/21/21-patient is improved mildly , she's off CRRT, shes off insulin drip. She is mentating.  She was evaluated by [facility].  She was unable to vocalize with PMV.  She may need PEG tube for nourishment.   05/22/21- patient is overnight worsened with fever and increased levophed dose.  She is negative 1.7L.   She is on zosyn.   Past Medical History     Arthritis   Asthma   COPD (chronic obstructive pulmonary disease) ([place])   Diabetes mellitus without complication ([place])   Endometriosis   GERD (gastroesophageal reflux disease)   Hypertension   Obesity   Sleep apnea    CPAP    Significant Hospital Events   05/04/21: Admitted to the ICU with severe sepsis with shock secondary to cellulitis of LLE 1/23 off pressors  +Afib with RVR 1/24 blood pressure low  1/24 to OR for fasciotomy 1/25 post op resp failure with severe septic shock 1/26 remains on pressors 1/27 returns to the OR today for debridement, wound VAC 1/28 overnight with hemodynamic deterioration, required reintubation, currently on pressors 1/29 remains on 3 pressors, CRRT, wound VAC and minimal sedation.  Awakens to voice and touch.  Seen by vascular surgery and awaiting input from general surgery regarding amputation of left leg 1/30 remains on vent remains on pressors 1/31 remains on pressors, on vent, on CRRT 2/1 remains on pressors, on CRRT, MRI/CT shows SAH, afib with RVR, ECHO pending, restarted AMIO at 33 2/2 remains on vent plan for trach tomorrow 2/3 remains on crrt, remains on vent, plan for [facility] and wound vac change in OR 2/3 S/P TRACH,  Implantation of cellular matrix on left leg 2/4 REMAINS ON PRESSORS, REMAINS ON VENT, REMAINS ON CRRT 2/5 REMAINS ON VENT, OFF PRESSORS, REMAINS ON CRRT  Significant Diagnostic Tests:  1/21: Chest Xray> no active cardiopulmonary process 1/21: CT left lower leg>Diffuse subcutaneous edema may represent cellulitis. No drainable fluid collection/abscess 1/21: Ultrasound lower unilateral left> no DVT   Micro Data:  1/21: SARS-CoV-2 PCR> negative 1/21: Influenza PCR> negative 1/21: Blood culture x2> Streptococcus pyogenes 1/21: Urine Culture> negative 1/21: MRSA PCR>> negative   Antimicrobials:  Vancomycin 1/21> off Cefepime 1/21> off Ceftriaxone 1/21 >> off Penicilli G 1/24>> Linezolid 1/25>>  *Antimicrobials per ID- zosyn now   Objective   Blood pressure (!) 109/49, pulse 88,  temperature 100.2 F (37.9 C), resp. rate (!) 22, height '5\' 5"'  (1.651 m), weight 116.6 kg, SpO2 96 %.    Vent Mode: PSV;CPAP FiO2 (%):  [28 %-30 %] 28 % PEEP:  [5 cmH20] 5 cmH20 Pressure Support:  [5 cmH20] 5 cmH20   Intake/Output Summary (Last 24 hours) at 05/22/2021 1012 Last data filed at 05/22/2021  0600 Gross per 24 hour  Intake 794.78 ml  Output 386 ml  Net 408.78 ml    Filed Weights   05/21/21 1648 05/21/21 2000 05/22/21 0500  Weight: 117.1 kg 117.4 kg 116.6 kg    REVIEW OF SYSTEMS  PATIENT IS UNABLE TO PROVIDE COMPLETE REVIEW OF SYSTEMS DUE TO SEVERE CRITICAL ILLNESS AND TOXIC METABOLIC ENCEPHALOPATHY    PHYSICAL EXAMINATION:  GENERAL:critically ill appearing, +resp distress EYES: Pupils equal, round, reactive to light.  No scleral icterus.  MOUTH: Moist mucosal membrane. INTUBATED NECK: Supple.  PULMONARY: Mild rhonchi bilaterally  CARDIOVASCULAR: S1 and S2.  No murmurs  GASTROINTESTINAL: Soft, nontender, -distended. Positive bowel sounds.  MUSCULOSKELETAL: No swelling, clubbing, or edema.  NEUROLOGIC: GCS8T MUSCULOSKELETAL: Left lower extremity with wound VAC in place, surgery redressing.  Status post fasciotomy     Assessment & Plan    Admitted to the ICU with severe sepsis with shock secondary to cellulitis of LLE septic shock with LLL necrotizing fascitis post op resp failure and severe septic shock with progressive renal failure on CRRT and progressive multiorgan failure   Severe ACUTE Hypoxic and Hypercapnic Respiratory Failure       ETIOLOGY IS SEPTIC SHOCK WITH ATELECTASIS, CHF PULMONARY EDEMA AND PLEURAL EFFUSIONS ALL PRESENT ON ADMISSION -continue Mechanical Ventilator support -Wean Fio2 and PEEP as tolerated -VAP/VENT bundle implementation - Wean PEEP & FiO2 as tolerated, maintain SpO2 > 88% - Head of bed elevated 30 degrees, VAP protocol in place - Plateau pressures less than 30 cm H20  - Intermittent chest x-ray & ABG PRN - Ensure adequate pulmonary hygiene  S/P TRACH WEAN TO PS MODE CONSIDER TCT -SLP and PT/OT ordered -05/20/21  SEPTIC shock SOURCE-LLE-present on admission  Refractory Septic Shock secondary to LLE necrotizing fasciitis Streptococcal toxic shock syndrome use vasopressors to keep MAP>65 as needed -continue antibiotics as  prescribed-zyvox and rocephin IV - Status postdebridement, wound VAC per surgery -Vascular and general surgery following S/p IVIG   ACUTE KIDNEY INJURY/Renal Failure -continue Foley Catheter-assess need -Avoid nephrotoxic agents -Follow urine output, BMP -Ensure adequate renal perfusion, optimize oxygenation -Renal dose medications CRRT ongoing - GFR is now normal while being dialyzed  Intake/Output Summary (Last 24 hours) at 05/22/2021 1012 Last data filed at 05/22/2021 0600 Gross per 24 hour  Intake 794.78 ml  Output 386 ml  Net 408.78 ml    SHOCK LIVER     - S/P supportive care for sepsis with improved liver enzymes and resolution of transaminitis     - resolved    Severe electrolyte derrangements     - pharmacy consultation       -improved   Acute encephalopathy  Toxic metabolic encephalopathy Loaded with keppra; continue at 1gram bid MRI/CT HEAD SHOWS SMALL VOLUME SAH APPRECIATE NEUROSURGERY RECS -Neurology consulted NO ANTICOAGULATION FOR  1 MONTH AND THEN NEED TO RE-ASSESS NO ANTIPLATELET AGENTS FOR 1 MONTH AND THEN NEED TO RE-ASSESS CAN POSSIBLY CONSIDER BABY ASA BUT WOULD NEED TO PROCEED WITH CAUTION CAN CONSIDER  DVT PROPHYLAXIS AFTER SURGERY BASED ON RISK OF BLEEDING   ENDO - ICU hypoglycemic\Hyperglycemia protocol -check FSBS per protocol  GI GI PROPHYLAXIS as indicated  NUTRITIONAL STATUS DIET-->TF's as tolerated Constipation protocol as indicated   ELECTROLYTES -follow labs as needed -replace as needed -pharmacy consultation and following  ENDO - ICU hypoglycemic\Hyperglycemia protocol -check FSBS per protocol   Best practice:  Diet: NPO Pain/Anxiety/Delirium protocol (if indicated): Yes, initiated VAP protocol (if indicated): Yes, initiated DVT prophylaxis: Subcutaneous Heparin GI prophylaxis: PPI Glucose control: ICU hyperglycemia protocol, insulin drip discontinued; SQ insulin Central venous access:  Rt IJV Arterial line:   N/A Foley:  Yes Mobility:  bed rest  PT consulted: N/A Last date of multidisciplinary goals of care discussion [1/27] Code Status:  full code Disposition: ICU    Labs   CBC: Recent Labs  Lab 05/18/21 0305 05/19/21 0421 05/20/21 0550 05/21/21 0402 05/22/21 0526  WBC 14.6* 25.9* 25.0* 22.7* 17.2*  NEUTROABS  --  18.9* 19.4* 17.5* 12.6*  HGB 8.3* 7.7* 7.8* 7.4* 7.8*  HCT 25.3* 22.6* 22.5* 22.5* 24.1*  MCV 87.2 83.7 82.4 87.2 87.0  PLT 152 127* 161 166 228     Basic Metabolic Panel: Recent Labs  Lab 05/17/21 0350 05/17/21 1604 05/18/21 0305 05/18/21 1614 05/19/21 0421 05/19/21 1606 05/20/21 0549 05/20/21 0550 05/20/21 1630 05/21/21 0402 05/22/21 0526  NA 135   < > 134*   < >  --  137 134*  --  133* 134* 136  K 4.5   < > 4.4   < >  --  3.6 4.5  --  4.7 4.6 4.3  CL 102   < > 100   < >  --  103 100  --  100 101 102  CO2 21*   < > 23   < >  --  23 24  --  '22 22 25  ' GLUCOSE 103*   < > 197*   < >  --  125* 158*  --  238* 134* 94  BUN 44*   < > 38*   < >  --  55* 36*  --  44* 63* 71*  CREATININE 1.38*   < > 1.12*   < >  --  1.13* 0.90  --  0.93 1.69* 2.34*  CALCIUM 8.3*   < > 8.3*   < >  --  9.2 9.0  --  9.1 9.2 9.2  MG 2.4  --  2.4  --  2.7*  --   --  2.5*  --  2.6*  --   PHOS 3.6   < > 3.4   < >  --  1.5* 5.1*  --  2.5 2.8 3.9   < > = values in this interval not displayed.    GFR: Estimated Creatinine Clearance: 27.7 mL/min (A) (by C-G formula based on SCr of 2.34 mg/dL (H)). Recent Labs  Lab 05/19/21 0421 05/20/21 0550 05/21/21 0402 05/22/21 0526  WBC 25.9* 25.0* 22.7* 17.2*     Liver Function Tests: Recent Labs  Lab 05/17/21 0350 05/17/21 1604 05/19/21 1606 05/20/21 0549 05/20/21 1630 05/21/21 0402 05/22/21 0526  AST 163*  --   --   --   --   --   --   ALT 326*  --   --   --   --   --   --   ALKPHOS 452*  --   --   --   --   --   --   BILITOT 3.6*  --   --   --   --   --   --  PROT 6.2*  --   --   --   --   --   --   ALBUMIN 1.9*   1.9*   <  > 3.3* 3.7 3.2* 2.9* 2.4*   < > = values in this interval not displayed.    Coagulation Profile: Recent Labs  Lab 05/16/21 0422 05/17/21 0350 05/22/21 0526  INR 1.3* 1.3* 1.2     Cardiac Enzymes: No results for input(s): CKTOTAL, CKMB, CKMBINDEX, TROPONINI in the last 168 hours.   HbA1C: Hgb A1c MFr Bld  Date/Time Value Ref Range Status  05/05/2021 04:53 AM 6.7 (H) 4.8 - 5.6 % Final    Comment:    (NOTE)         Prediabetes: 5.7 - 6.4         Diabetes: >6.4         Glycemic control for adults with diabetes: <7.0   02/11/2021 09:41 AM 6.3 (H) <5.7 % of total Hgb Final    Comment:    For someone without known diabetes, a hemoglobin  A1c value between 5.7% and 6.4% is consistent with prediabetes and should be confirmed with a  follow-up test. . For someone with known diabetes, a value <7% indicates that their diabetes is well controlled. A1c targets should be individualized based on duration of diabetes, age, comorbid conditions, and other considerations. . This assay result is consistent with an increased risk of diabetes. . Currently, no consensus exists regarding use of hemoglobin A1c for diagnosis of diabetes for children. .     CBG: Recent Labs  Lab 05/21/21 1534 05/21/21 1920 05/21/21 2309 05/22/21 0329 05/22/21 0723  GLUCAP 182* 158* 148* 80 80    No Known Allergies   Medications  Scheduled Meds:  amiodarone  200 mg Per Tube Daily   arformoterol  15 mcg Nebulization BID   budesonide (PULMICORT) nebulizer solution  0.25 mg Nebulization BID   chlorhexidine gluconate (MEDLINE KIT)  15 mL Mouth Rinse BID   Chlorhexidine Gluconate Cloth  6 each Topical Q0600   insulin aspart  0-9 Units Subcutaneous Q4H   insulin aspart  4 Units Subcutaneous Q4H   insulin detemir  17 Units Subcutaneous BID   magic mouthwash w/lidocaine  10 mL Oral QID   mouth rinse  15 mL Mouth Rinse 10 times per day   midodrine  10 mg Per Tube TID WC   pantoprazole (PROTONIX)  IV  40 mg Intravenous Q24H   povidone-Iodine   Topical Daily   revefenacin  175 mcg Nebulization Daily   sodium chloride flush  10-40 mL Intracatheter Q12H   Continuous Infusions:  sodium chloride 250 mL (05/20/21 1427)   erythromycin 100 mL/hr at 05/22/21 0600   norepinephrine (LEVOPHED) Adult infusion 13 mcg/min (05/22/21 0600)   piperacillin-tazobactam (ZOSYN)  IV 12.5 mL/hr at 05/22/21 0600   prismasol BGK 2/2.5 dialysis solution     prismasol BGK 2/2.5 replacement solution     prismasol BGK 2/2.5 replacement solution     PRN Meds:.acetaminophen, artificial tears, docusate, fentaNYL (SUBLIMAZE) injection, heparin, lip balm, midazolam, ondansetron (ZOFRAN) IV, oxyCODONE, sennosides, sodium chloride, sodium chloride flush    Critical care provider statement:   Total critical care time: 33 minutes   Performed by: Lanney Gins MD   Critical care time was exclusive of separately billable procedures and treating other patients.   Critical care was necessary to treat or prevent imminent or life-threatening deterioration.   Critical care was time spent personally by me on the following  activities: development of treatment plan with patient and/or surrogate as well as nursing, discussions with consultants, evaluation of patient's response to treatment, examination of patient, obtaining history from patient or surrogate, ordering and performing treatments and interventions, ordering and review of laboratory studies, ordering and review of radiographic studies, pulse oximetry and re-evaluation of patient's condition.    Ottie Glazier, M.D.  Pulmonary & Critical Care Medicine

## 2021-05-22 NOTE — Consult Note (Signed)
PHARMACY CONSULT NOTE - FOLLOW UP  Pharmacy Consult for Electrolyte Monitoring and Replacement   Recent Labs: Potassium (mmol/L)  Date Value  05/22/2021 4.3  08/11/2013 3.3 (L)   Magnesium (mg/dL)  Date Value  05/21/2021 2.6 (H)   Calcium (mg/dL)  Date Value  05/22/2021 9.2   Calcium, Total (mg/dL)  Date Value  08/11/2013 9.2   Albumin (g/dL)  Date Value  05/22/2021 2.4 (L)  08/11/2013 3.1 (L)   Phosphorus (mg/dL)  Date Value  05/22/2021 3.9   Sodium (mmol/L)  Date Value  05/22/2021 136  08/11/2013 138   Assessment: 71 yo female with a previous medical history of diabetes mellitus type 2, hypertension, obstructive sleep apnea on CPAP, asthma/COPD, GERD, history of COVID-19 pneumonia presenting with lower left extremity pain and chills. Patient found to have severe sepsis and strep pyogenes bacteremia on admission and is currently intubated and sedated. LLL necrotizing fascitis. [facility]  Pharmacy has been consulted to monitor and replace electrolytes.  -CRRT stopped 2/6 -s/p trach 2/3, tolerating trach collar trials  Nutrition:  --Feeds per tube --Free water per tube 30 mL Q4H  Goal of Therapy:  Electrolytes within normal limits  Plan:  --No replacement at this time  --Follow-up electrolytes with AM labs tomorrow  Benita Gutter 05/22/2021 7:26 AM

## 2021-05-22 NOTE — Progress Notes (Signed)
Inpatient Diabetes Program Recommendations  AACE/ADA: New Consensus Statement on Inpatient Glycemic Control   Target Ranges:  Prepandial:   less than 140 mg/dL      Peak postprandial:   less than 180 mg/dL (1-2 hours)      Critically ill patients:  140 - 180 mg/dL    Latest Reference Range & Units 05/21/21 07:22 05/21/21 11:12 05/21/21 15:34 05/21/21 19:20 05/21/21 23:09 05/22/21 03:29 05/22/21 07:23  Glucose-Capillary 70 - 99 mg/dL 108 (H) 149 (H) 182 (H) 158 (H) 148 (H) 80 80   Review of Glycemic Control  Diabetes history: DM2 Outpatient Diabetes medications: Metformin XR 750 mg daily Current orders for Inpatient glycemic control: Levemir 17 units BID, Novolog 4 units Q4H, Novolog 0-9 units Q4H  Inpatient Diabetes Program Recommendations:    Insulin: Noted Pivot order for 65 ml/hr (stopped at midnight) and patient is planned for PEG tube placement today. Novolog 4 units Q4H for tube feeding coverage should continue to be held until tube feeding resumed. Please consider decreasing Levemir to 13 units BID.  Thanks, Barnie Alderman, RN, MSN, CDE Diabetes Coordinator Inpatient Diabetes Program [phone] (Team Pager from 8am to 5pm)

## 2021-05-22 NOTE — Progress Notes (Signed)
Patient alert and able to follow commands, in NSR, on trach collar. +2 pulses, +1 on LLE. Patient is able to squeeze hands and wiggle toes. Weakness in lower left extremity, but movement present. Tube feeds restarted at trickle per MD. Foley removed per MD. Patient currently on CRRT,no complications, olerating well, with net zero goal. Patient given PRN medication per orders. Oral care performed as well as patient would tolerate. Trach care performed. Wound care provided to stoma site and wound on backside. Patient is currently resting.

## 2021-05-23 ENCOUNTER — Inpatient Hospital Stay: Payer: Medicare HMO

## 2021-05-23 DIAGNOSIS — M726 Necrotizing fasciitis: Secondary | ICD-10-CM | POA: Diagnosis not present

## 2021-05-23 DIAGNOSIS — R569 Unspecified convulsions: Secondary | ICD-10-CM

## 2021-05-23 DIAGNOSIS — B950 Streptococcus, group A, as the cause of diseases classified elsewhere: Secondary | ICD-10-CM | POA: Diagnosis not present

## 2021-05-23 DIAGNOSIS — D500 Iron deficiency anemia secondary to blood loss (chronic): Secondary | ICD-10-CM

## 2021-05-23 DIAGNOSIS — B965 Pseudomonas (aeruginosa) (mallei) (pseudomallei) as the cause of diseases classified elsewhere: Secondary | ICD-10-CM

## 2021-05-23 DIAGNOSIS — R6521 Severe sepsis with septic shock: Secondary | ICD-10-CM | POA: Diagnosis not present

## 2021-05-23 LAB — CBC WITH DIFFERENTIAL/PLATELET
Abs Immature Granulocytes: 0.94 10*3/uL — ABNORMAL HIGH (ref 0.00–0.07)
Basophils Absolute: 0.1 10*3/uL (ref 0.0–0.1)
Basophils Relative: 1 %
Eosinophils Absolute: 0.5 10*3/uL (ref 0.0–0.5)
Eosinophils Relative: 4 %
HCT: 22.2 % — ABNORMAL LOW (ref 36.0–46.0)
Hemoglobin: 7.4 g/dL — ABNORMAL LOW (ref 12.0–15.0)
Immature Granulocytes: 7 %
Lymphocytes Relative: 7 %
Lymphs Abs: 1 10*3/uL (ref 0.7–4.0)
MCH: 28.4 pg (ref 26.0–34.0)
MCHC: 33.3 g/dL (ref 30.0–36.0)
MCV: 85.1 fL (ref 80.0–100.0)
Monocytes Absolute: 0.8 10*3/uL (ref 0.1–1.0)
Monocytes Relative: 6 %
Neutro Abs: 10.3 10*3/uL — ABNORMAL HIGH (ref 1.7–7.7)
Neutrophils Relative %: 75 %
Platelets: 248 10*3/uL (ref 150–400)
RBC: 2.61 MIL/uL — ABNORMAL LOW (ref 3.87–5.11)
RDW: 23.6 % — ABNORMAL HIGH (ref 11.5–15.5)
WBC: 13.6 10*3/uL — ABNORMAL HIGH (ref 4.0–10.5)
nRBC: 31.8 % — ABNORMAL HIGH (ref 0.0–0.2)

## 2021-05-23 LAB — GLUCOSE, CAPILLARY
Glucose-Capillary: 152 mg/dL — ABNORMAL HIGH (ref 70–99)
Glucose-Capillary: 150 mg/dL — ABNORMAL HIGH (ref 70–99)
Glucose-Capillary: 162 mg/dL — ABNORMAL HIGH (ref 70–99)
Glucose-Capillary: 150 mg/dL — ABNORMAL HIGH (ref 70–99)
Glucose-Capillary: 155 mg/dL — ABNORMAL HIGH (ref 70–99)
Glucose-Capillary: 221 mg/dL — ABNORMAL HIGH (ref 70–99)
Glucose-Capillary: 204 mg/dL — ABNORMAL HIGH (ref 70–99)
Glucose-Capillary: 197 mg/dL — ABNORMAL HIGH (ref 70–99)

## 2021-05-23 LAB — RENAL FUNCTION PANEL
Albumin: 2.1 g/dL — ABNORMAL LOW (ref 3.5–5.0)
Albumin: 2.2 g/dL — ABNORMAL LOW (ref 3.5–5.0)
Anion gap: 8 (ref 5–15)
Anion gap: 8 (ref 5–15)
BUN: 52 mg/dL — ABNORMAL HIGH (ref 8–23)
BUN: 56 mg/dL — ABNORMAL HIGH (ref 8–23)
CO2: 26 mmol/L (ref 22–32)
CO2: 25 mmol/L (ref 22–32)
Calcium: 8.9 mg/dL (ref 8.9–10.3)
Calcium: 9.2 mg/dL (ref 8.9–10.3)
Chloride: 100 mmol/L (ref 98–111)
Chloride: 100 mmol/L (ref 98–111)
Creatinine, Ser: 1.95 mg/dL — ABNORMAL HIGH (ref 0.44–1.00)
Creatinine, Ser: 1.84 mg/dL — ABNORMAL HIGH (ref 0.44–1.00)
GFR, Estimated: 27 mL/min — ABNORMAL LOW (ref >60)
GFR, Estimated: 29 mL/min — ABNORMAL LOW (ref >60)
Glucose, Bld: 180 mg/dL — ABNORMAL HIGH (ref 70–99)
Glucose, Bld: 246 mg/dL — ABNORMAL HIGH (ref 70–99)
Phosphorus: 3.2 mg/dL (ref 2.5–4.6)
Phosphorus: 3.2 mg/dL (ref 2.5–4.6)
Potassium: 3.7 mmol/L (ref 3.5–5.1)
Potassium: 3.9 mmol/L (ref 3.5–5.1)
Sodium: 134 mmol/L — ABNORMAL LOW (ref 135–145)
Sodium: 133 mmol/L — ABNORMAL LOW (ref 135–145)

## 2021-05-23 LAB — HEPATITIS B SURFACE ANTIBODY, QUANTITATIVE: Hep B S AB Quant (Post): 112.8 m[IU]/mL (ref >9.9)

## 2021-05-23 LAB — MAGNESIUM: Magnesium: 2.2 mg/dL (ref 1.7–2.4)

## 2021-05-23 MED ORDER — ALBUMIN HUMAN 25 % IV SOLN
12.5000 g | Freq: Every day | INTRAVENOUS | Status: DC
  Administered 2021-05-23 – 2021-05-27 (×5): 12.5 g via INTRAVENOUS
  Filled 2021-05-23 (×5): qty 50

## 2021-05-23 MED ORDER — HEPARIN SODIUM (PORCINE) 5000 UNIT/ML IJ SOLN
5000.0000 [IU] | Freq: Two times a day (BID) | INTRAMUSCULAR | Status: AC
  Administered 2021-05-23 (×2): 5000 [IU] via SUBCUTANEOUS
  Filled 2021-05-23 (×2): qty 1

## 2021-05-23 MED ORDER — RENA-VITE PO TABS
1.0000 | ORAL_TABLET | Freq: Every day | ORAL | Status: DC
  Administered 2021-05-23 – 2021-06-12 (×19): 1
  Filled 2021-05-23 (×19): qty 1

## 2021-05-23 MED ORDER — HYDROCORTISONE SOD SUC (PF) 100 MG IJ SOLR
100.0000 mg | Freq: Two times a day (BID) | INTRAMUSCULAR | Status: DC
  Administered 2021-05-23 – 2021-06-01 (×19): 100 mg via INTRAVENOUS
  Filled 2021-05-23 (×19): qty 2

## 2021-05-23 MED ORDER — JUVEN PO PACK
1.0000 | PACK | Freq: Two times a day (BID) | ORAL | Status: DC
  Administered 2021-05-23 – 2021-06-13 (×33): 1

## 2021-05-23 MED ORDER — PIVOT 1.5 CAL PO LIQD
1000.0000 mL | ORAL | Status: DC
  Administered 2021-05-26 – 2021-05-27 (×3): 1000 mL
  Filled 2021-05-23: qty 1000

## 2021-05-23 MED ORDER — LACTATED RINGERS IV BOLUS
1000.0000 mL | Freq: Once | INTRAVENOUS | Status: AC
  Administered 2021-05-23: 1000 mL via INTRAVENOUS

## 2021-05-23 NOTE — Evaluation (Signed)
Physical Therapy Evaluation Patient Details Name: Alexandra Foster MRN: [id number] DOB: 12-20-1950 Today's Date: 05/23/2021  History of Present Illness  71 y/o female with history of Asthma/COPD, T2DM, HTN, OSA on CPAP, GERD, COVID-19 pneumonia, and Bilateral lower extremity edema who presented to the ED with chief complaint of LLE pain.  Cellulitis of LLE, septic shock with LLL necrotizing fascitis post op resp failure and severe septic shock with progressive renal failure on CRRT and progressive multiorgan failure.  Pt needing vent support on trach at night, HD, tube feeds.  Clinical Impression  Difficult situation, pt able to open eyes and show some inconsistent participation but is extremely weak and limited.  Per nursing she will likely be on continuous HD for a while (likely at least until tomorrow) so requests not to try mobility/sitting, frankly she could not have done this today anyway.  Pt with trach and questionable alertness, focused most of the session on trying to elicit AAROM  LE (R>L) and UE - pt with some inconsistent AAROM but functionally very limited.  Per discussion with surgical MD okay to try some WBing simulation on L but with attempts today pt indicated severe discomfort with L LE activity.  Pt will clearly need medical and rehab care for some time and plans are in place for LTAC, which this PT fully supports.  We will pick pt up for PT with the understanding that she will likely be tolerating little more than limited P/AAROM for some time until her medical situation improves.  Pt back to surgery for further I&D of L LE tomorrow.       Recommendations for follow up therapy are one component of a multi-disciplinary discharge planning process, led by the attending physician.  Recommendations may be updated based on patient status, additional functional criteria and insurance authorization.  Follow Up Recommendations PT at Long-term acute care hospital    Assistance Recommended  at Discharge Frequent or constant Supervision/Assistance  Patient can return home with the following  Other (comment) (current status precludes ability to return home)    Equipment Recommendations  (TBD)  Recommendations for Other Services       Functional Status Assessment Patient has had a recent decline in their functional status and/or demonstrates limited ability to make significant improvements in function in a reasonable and predictable amount of time     Precautions / Restrictions Restrictions Weight Bearing Restrictions: Yes LLE Weight Bearing: Weight bearing as tolerated (per surgeon okay to simulate WBing per tolerance)      Mobility  Bed Mobility               General bed mobility comments: HD via IJ, nursing requests not to try sitting up, etc today; regardless she could not have attempted anyway    Transfers                        Ambulation/Gait                  Stairs            Wheelchair Mobility    Modified Rankin (Stroke Patients Only)       Balance                                             Pertinent Vitals/Pain Pain Assessment Pain Assessment: Faces Faces  Pain Scale: Hurts even more Pain Location: did not indicate pain with anything but during attempts at [place] expects to be discharged to:: Inpatient rehab Living Arrangements: Spouse/significant other                      Prior Function Prior Level of Function :  (information gathered from prior documentation)             Mobility Comments: Pt was hosptialized with Covid ~2 years ago and was able to easily ambulate >200 ft w/o AD, d/c'd from PT w/o further needs.  Appears she was relatively recently independent       Hand Dominance        Extremity/Trunk Assessment   Upper Extremity Assessment Upper Extremity Assessment: Generalized weakness (inconsistent with testing, very weak t/o,  able to do some minimal AAROM elbow/hand; no shoulder activation during PT session but nursing reports she had attempted to raise her arms against gravity minimally)    Lower Extremity Assessment Lower Extremity Assessment: Generalized weakness (very limited even with heavy cuing and AAROM, very poor tolerance to L LE movement due to wound)       Communication   Communication: Tracheostomy (able to open eyes, did not give thumbs up when cued but able to make pain known when working with L LE, minimal interactive/awake)  Cognition Arousal/Alertness: Lethargic Behavior During Therapy: Flat affect Overall Cognitive Status: Difficult to assess                                 General Comments: Pt opens eyes to name, does not interact consistently or readily.  Did inconsistently follow instructions for exercises but overall appears limited cognition and unable to fully participate        General Comments General comments (skin integrity, edema, etc.): Pt with inconsistent and poor ability to particiapte with exercises, but did show some effort with plenty of encouragement and repeated cuing    Exercises General Exercises - Upper Extremity Elbow Flexion: PROM, AAROM, 10 reps Elbow Extension: PROM, AAROM, 10 reps Digit Composite Flexion: AROM, 10 reps General Exercises - Lower Extremity Ankle Circles/Pumps: PROM, AAROM, 10 reps (pt with very poor tolerance (pain) on L with PROM) Short Arc Quad: PROM, 10 reps (unble to initiate any quad engagement) Heel Slides: PROM, 10 reps, Right (AAROM leg ext with some ability to push actively against "resistance") Hip ABduction/ADduction: AAROM, 10 reps (again very poor tolerance on L, 5 reps PROM)   Assessment/Plan    PT Assessment Patient needs continued PT services  PT Problem List Decreased strength;Decreased range of motion;Decreased activity tolerance;Decreased balance;Decreased mobility;Decreased coordination;Decreased  cognition;Decreased knowledge of use of DME;Decreased safety awareness;Pain       PT Treatment Interventions Gait training;DME instruction;Functional mobility training;Therapeutic activities;Therapeutic exercise;Balance training;Cognitive remediation;Neuromuscular re-education;Patient/family education    PT Goals (Current goals can be found in the Care Plan section)  Acute Rehab PT Goals PT Goal Formulation: Patient unable to participate in goal setting Time For Goal Achievement: 06/06/21 Potential to Achieve Goals: Poor    Frequency Min 2X/week     Co-evaluation               AM-PAC PT "6 Clicks" Mobility  Outcome Measure Help needed turning from your back to your side while in a flat bed without using bedrails?: Total Help needed moving from lying on your back  to sitting on the side of a flat bed without using bedrails?: Total Help needed moving to and from a bed to a chair (including a wheelchair)?: Total Help needed standing up from a chair using your arms (e.g., wheelchair or bedside chair)?: Total Help needed to walk in hospital room?: Total Help needed climbing 3-5 steps with a railing? : Total 6 Click Score: 6    End of Session Equipment Utilized During Treatment: Oxygen Activity Tolerance: Patient limited by lethargy;Patient limited by pain Patient left: in bed;with nursing/sitter in room Nurse Communication: Mobility status PT Visit Diagnosis: Muscle weakness (generalized) (M62.81);Adult, failure to thrive (R62.7)    Time: 1137-1202 PT Time Calculation (min) (ACUTE ONLY): 25 min   Charges:   PT Evaluation $PT Eval Moderate Complexity: 1 Mod PT Treatments $Therapeutic Exercise: 8-22 mins        Kreg Shropshire, DPT 05/23/2021, 1:46 PM

## 2021-05-23 NOTE — Evaluation (Signed)
Occupational Therapy Evaluation Patient Details Name: Alexandra Foster MRN: [id number] DOB: December 28, 1950 Today's Date: 05/23/2021   History of Present Illness 71 y/o female with history of Asthma/COPD, T2DM, HTN, OSA on CPAP, GERD, COVID-19 pneumonia, and Bilateral lower extremity edema who presented to the ED with chief complaint of LLE pain.  Cellulitis of LLE, septic shock with LLL necrotizing fascitis post op resp failure and severe septic shock with progressive renal failure on CRRT and progressive multiorgan failure.  Pt needing vent support on trach at night, HD, tube feeds.   Clinical Impression   Pt was seen for limited OT evaluation this date. RN and nurse tech present, RN agreeable to limited assessment including bed level bathing. Actively getting IJ HD. NG tube feeds paused. Pt intermittently alert, opens eyes briefly and able to follow simple commands to squeeze hands, give "thumbs up." Pt profoundly weak, noted some skin tears/wounds under skin folds (nursing observed as well). Pt required TOTAL A +2-3 for rolling and pt was TOTAL A +2 for bathing, pericare, and TOTAL A +1 for grooming tasks. Pt demonstrates significant impairments as described below (See OT problem list) which functionally limit her ability to perform ADL/self-care tasks and ADL mobility. Pt would benefit from skilled OT services to address noted impairments and functional limitations (see below for any additional details) in order to maximize safety and independence while minimizing falls risk and caregiver burden. Upon hospital discharge, recommend skilled OT services at long term acute care hospital once medically stable.      Recommendations for follow up therapy are one component of a multi-disciplinary discharge planning process, led by the attending physician.  Recommendations may be updated based on patient status, additional functional criteria and insurance authorization.   Follow Up Recommendations  OT at  Long-term acute care hospital    Assistance Recommended at Discharge Frequent or constant Supervision/Assistance  Patient can return home with the following Two people to help with bathing/dressing/bathroom;Two people to help with walking and/or transfers;Assistance with cooking/housework;Assist for transportation;Help with stairs or ramp for entrance;Assistance with feeding;Direct supervision/assist for financial management;Direct supervision/assist for medications management    Functional Status Assessment  Patient has had a recent decline in their functional status and demonstrates the ability to make significant improvements in function in a reasonable and predictable amount of time.  Equipment Recommendations  Other (comment) (defer to next venue)    Recommendations for Other Services       Precautions / Restrictions Precautions Precaution Comments: R IJ, L IJ, wounds under most skin folds, LLE wounds w/ wound vac, trach, NG tube Restrictions Weight Bearing Restrictions: Yes LLE Weight Bearing: Weight bearing as tolerated (per surgeon okay to simulate WBing per tolerance)      Mobility Bed Mobility Overal bed mobility: Needs Assistance Bed Mobility: Rolling Rolling: Total assist, +2 for physical assistance         General bed mobility comments: TOTAL +2-3 for log rolling during bed level bathing and skin checks    Transfers                   General transfer comment: not safe to attempt (IJ HD)      Balance                                           ADL either performed or assessed with clinical judgement   ADL  General ADL Comments: Pt currently requires MAX A x1-2 for all ADL at this time 2/2 profound weakness     Vision         Perception     Praxis      Pertinent Vitals/Pain Pain Assessment Pain Assessment: Faces Faces Pain Scale: Hurts even more Pain Location: when  LLE was repositioned Pain Intervention(s): Limited activity within patient's tolerance, Monitored during session, Repositioned     Hand Dominance Left   Extremity/Trunk Assessment Upper Extremity Assessment Upper Extremity Assessment: Generalized weakness (profound weakness bilaterally)   Lower Extremity Assessment Lower Extremity Assessment: Generalized weakness (significant wounds to LLE)       Communication Communication Communication: Tracheostomy (able to open eyes, did not give thumbs up when cued but able to make pain known when working with L LE, minimal interactive/awake)   Cognition Arousal/Alertness: Lethargic Behavior During Therapy: Flat affect Overall Cognitive Status: Difficult to assess                                 General Comments: opens eyes with VC, follows commands to give thumbs up and to squeeze OT's hand x3     General Comments      Exercises Other Exercises Other Exercises: bed level bathing involving log rolling, pt able to engage intermittently, squeezes OT's hand some when asked during RUE bathing   Shoulder Instructions      Home Living Family/patient expects to be discharged to:: Inpatient rehab Living Arrangements: Spouse/significant other                                      Prior Functioning/Environment Prior Level of Function :  (information gathered from prior documentation)             Mobility Comments: Pt was hosptialized with Covid ~2 years ago and was able to easily ambulate >200 ft w/o AD, d/c'd from PT w/o further needs.  Appears she was relatively recently independent          OT Problem List: Decreased strength;Pain;Decreased range of motion;Decreased cognition;Obesity;Impaired balance (sitting and/or standing);Decreased activity tolerance;Impaired UE functional use      OT Treatment/Interventions: Self-care/ADL training;Therapeutic exercise;Therapeutic activities;DME and/or AE  instruction;Patient/family education;Balance training    OT Goals(Current goals can be found in the care plan section) Acute Rehab OT Goals Patient Stated Goal: pt unable to state OT Goal Formulation: Patient unable to participate in goal setting Time For Goal Achievement: 06/06/21 Potential to Achieve Goals: Fair ADL Goals Pt Will Perform Grooming: bed level;with mod assist (supported sitting position in bed, wash face, oral care) Pt/caregiver will Perform Home Exercise Program: Increased strength;Both right and left upper extremity;With written HEP provided (with MOD A/ AAROM) Additional ADL Goal #1: Pt will participate in bed mobility requiring MAX A +1 person for bed level ADL and repositioning.  OT Frequency: Min 2X/week    Co-evaluation              AM-PAC OT "6 Clicks" Daily Activity     Outcome Measure Help from another person eating meals?: Total Help from another person taking care of personal grooming?: Total Help from another person toileting, which includes using toliet, bedpan, or urinal?: Total Help from another person bathing (including washing, rinsing, drying)?: Total Help from another person to put on and taking off regular upper body clothing?:  Total Help from another person to put on and taking off regular lower body clothing?: Total 6 Click Score: 6   End of Session    Activity Tolerance: Treatment limited secondary to medical complications (Comment) (IJ HD, etc.) Patient left:    OT Visit Diagnosis: Other abnormalities of gait and mobility (R26.89);Muscle weakness (generalized) (M62.81);Pain Pain - Right/Left: Left Pain - part of body: Leg                Time: 2482-5003 OT Time Calculation (min): 33 min Charges:  OT General Charges $OT Visit: 1 Visit OT Evaluation $OT Eval High Complexity: 1 High OT Treatments $Self Care/Home Management : 8-22 mins  Ardeth Perfect., MPH, MS, OTR/L ascom [phone] 05/23/21, 5:02 PM

## 2021-05-23 NOTE — Plan of Care (Addendum)
Patient placed back on vent overnight per order. Remains on levophed; weaning as tolerated. CRRT paused briefly overnight due to issue with access: tPa/cathflo for line per order. CRRT restarted at 0338 hrs.  Edit: PRBC initiated at 0524 hrs. Witnessed by Madelon Lips, RN. Infusion complete at 0630 hrs.  Problem: Education: Goal: Knowledge of General Education information will improve Description: Including pain rating scale, medication(s)/side effects and non-pharmacologic comfort measures Outcome: Progressing   Problem: Health Behavior/Discharge Planning: Goal: Ability to manage health-related needs will improve Outcome: Progressing   Problem: Clinical Measurements: Goal: Ability to maintain clinical measurements within normal limits will improve Outcome: Progressing Goal: Will remain free from infection Outcome: Progressing Goal: Diagnostic test results will improve Outcome: Progressing   Problem: Pain Managment: Goal: General experience of comfort will improve Outcome: Progressing   Problem: Safety: Goal: Ability to remain free from injury will improve Outcome: Progressing   Problem: Skin Integrity: Goal: Risk for impaired skin integrity will decrease Outcome: Progressing   Problem: Activity: Goal: Ability to tolerate increased activity will improve Outcome: Progressing   Problem: Respiratory: Goal: Ability to maintain a clear airway and adequate ventilation will improve Outcome: Progressing   Problem: Role Relationship: Goal: Method of communication will improve Outcome: Progressing   Problem: Education: Goal: Knowledge of disease and its progression will improve Outcome: Progressing   Problem: Clinical Measurements: Goal: Complications related to the disease process or treatment will be avoided or minimized Outcome: Progressing Goal: Dialysis access will remain free of complications Outcome: Progressing   Problem: Fluid Volume: Goal: Fluid volume  balance will be maintained or improved Outcome: Progressing   Problem: Urinary Elimination: Goal: Progression of disease will be identified and treated Outcome: Progressing   Problem: Education: Goal: Knowledge of disease or condition will improve Outcome: Progressing Goal: Knowledge of secondary prevention will improve (SELECT ALL) Outcome: Progressing Goal: Knowledge of patient specific risk factors will improve (INDIVIDUALIZE FOR PATIENT) Outcome: Progressing Goal: Individualized Educational Video(s) Outcome: Progressing   Problem: Coping: Goal: Will verbalize positive feelings about self Outcome: Progressing Goal: Will identify appropriate support needs Outcome: Progressing   Problem: Health Behavior/Discharge Planning: Goal: Ability to manage health-related needs will improve Outcome: Progressing   Problem: Self-Care: Goal: Ability to participate in self-care as condition permits will improve Outcome: Progressing Goal: Verbalization of feelings and concerns over difficulty with self-care will improve Outcome: Progressing Goal: Ability to communicate needs accurately will improve Outcome: Progressing   Problem: Nutrition: Goal: Risk of aspiration will decrease Outcome: Progressing Goal: Dietary intake will improve Outcome: Progressing   Problem: Spontaneous Subarachnoid Hemorrhage Tissue Perfusion: Goal: Complications of Spontaneous Subarachnoid Hemorrhage will be minimized Outcome: Progressing

## 2021-05-23 NOTE — Progress Notes (Signed)
Pt follows commands this shift, Symmetrical strength in all extremities. Denies pain. Refuses oral care at times due to oral bleeding and pain, Magic mouthwash administered. Trache cre performed. Pink secretions from trach and oral suction. VSS on levophed gtt. No urine output this shift, bladder scan shows 25 cc in bladder. Tolerating tube feed. Pt goal positive 1L this shift for LR bolus independent of CRRT.

## 2021-05-23 NOTE — Consult Note (Signed)
PHARMACY CONSULT NOTE - FOLLOW UP  Pharmacy Consult for Electrolyte Monitoring and Replacement   Recent Labs: Potassium (mmol/L)  Date Value  05/23/2021 3.7  08/11/2013 3.3 (L)   Magnesium (mg/dL)  Date Value  05/23/2021 2.2   Calcium (mg/dL)  Date Value  05/23/2021 8.9   Calcium, Total (mg/dL)  Date Value  08/11/2013 9.2   Albumin (g/dL)  Date Value  05/23/2021 2.1 (L)  08/11/2013 3.1 (L)   Phosphorus (mg/dL)  Date Value  05/23/2021 3.2   Sodium (mmol/L)  Date Value  05/23/2021 134 (L)  08/11/2013 138   Assessment: 71 yo female with a previous medical history of diabetes mellitus type 2, hypertension, obstructive sleep apnea on CPAP, asthma/COPD, GERD, history of COVID-19 pneumonia presenting with lower left extremity pain and chills. Patient found to have severe sepsis and strep pyogenes bacteremia on admission and is currently intubated and sedated. LLL necrotizing fascitis. [facility]  Pharmacy has been consulted to monitor and replace electrolytes.  -CRRT stopped 2/6, re-started 2/8 -s/p trach 2/3, tolerating trach collar trials  Nutrition:  --Feeds per tube --Free water per tube 30 mL Q4H  Goal of Therapy:  Electrolytes within normal limits  Plan:  --No replacement at this time  --Renal function panels BID while on CRRT  Benita Gutter 05/23/2021 7:40 AM

## 2021-05-23 NOTE — Progress Notes (Signed)
I  Date of Admission:  05/04/2021     ID: Alexandra Foster is a 71 y.o. female  Principal Problem:   Severe sepsis with septic shock (CODE) ([place]) Active Problems:   COPD (chronic obstructive pulmonary disease) ([place])   Morbid obesity ([place])   Diabetes mellitus type 2 in obese ([place])   Atrial fibrillation and flutter (HCC)   Necrotizing fasciitis ([place])   Acute on chronic respiratory failure ([place])   On mechanically assisted ventilation ([place])   Shock liver   Anemia associated with acute blood loss   Metabolic acidosis   Encounter for continuous renal replacement therapy (CRRT) for acute renal failure (HCC)   Acute encephalopathy   Group A streptococcal infection   Streptococcal toxic shock syndrome ([place])   Status post tracheostomy ([place])   Subdural hemorrhage ([place])   Left leg cellulitis    Subjective: No review of system available from patient.  Medications:   amiodarone  200 mg Per Tube Daily   chlorhexidine gluconate (MEDLINE KIT)  15 mL Mouth Rinse BID   Chlorhexidine Gluconate Cloth  6 each Topical Q0600   free water  30 mL Per Tube Q4H   heparin injection (subcutaneous)  5,000 Units Subcutaneous Q12H   hydrocortisone sod succinate (SOLU-CORTEF) inj  100 mg Intravenous Q12H   insulin aspart  0-9 Units Subcutaneous Q4H   insulin aspart  4 Units Subcutaneous Q4H   insulin detemir  17 Units Subcutaneous BID   magic mouthwash w/lidocaine  10 mL Oral QID   mouth rinse  15 mL Mouth Rinse 10 times per day   midodrine  10 mg Per Tube TID WC   multivitamin  1 tablet Per Tube QHS   nutrition supplement (JUVEN)  1 packet Per Tube BID BM   pantoprazole (PROTONIX) IV  40 mg Intravenous Q24H   povidone-Iodine   Topical Daily   sodium chloride flush  10-40 mL Intracatheter Q12H    Objective: Vital signs in last 24 hours: Temp:  [99.3 F (37.4 C)-100.6 F (38.1 C)] 99.8 F (37.7 C) (02/09 1125) Pulse Rate:  [70-93] 80 (02/09 1345) Resp:  [15-27] 22 (02/09 1345) BP:  (98-133)/(43-70) 125/49 (02/09 1345) SpO2:  [97 %-100 %] 98 % (02/09 1345) FiO2 (%):  [28 %-30 %] 30 % (02/09 0728) Weight:  [115.1 kg] 115.1 kg (02/09 0500)  PHYSICAL EXAM:  General: On calling the name patient opens her eyes.  She is able to follow commands like lifting her arm.  She squeezes fingers. Head: Normocephalic, without obvious abnormality, atraumatic. Eyes: Conjunctivae clear, anicteric sclerae. Pupils are equal ENT t- tongue tip looks ischemic with eschar Neck: Tracheostomy - gelatinous fluid no carotid bruit and no JVD.  Lungs: Bilateral air entry  heart: Regular rate and rhythm, no murmur, rub or gallop. Abdomen: Soft, non-tender,not distended. Bowel sounds normal. No masses Extremities: Left leg has wound VAC Picture reviewed     Lymph: Cervical, supraclavicular normal. Neurologic: Cannot be assessed Bilateral IJ lines.  On CRRT NG tube on tube feeds Foley catheter out anuria   Lab Results Recent Labs    05/22/21 0526 05/22/21 1615 05/23/21 0500  WBC 17.2*  --  13.6*  HGB 7.8*  --  7.4*  HCT 24.1*  --  22.2*  NA 136 133* 134*  K 4.3 4.4 3.7  CL 102 102 100  CO2 _0 BUN 71* 74* 52*  CREATININE 2.34* 2.48* 1.95*   Liver Panel Recent Labs    05/22/21 1104 05/22/21  1615 05/23/21 0500  PROT 6.3*  --   --   ALBUMIN 2.4* 2.2* 2.1*  AST 102*  --   --   ALT 138*  --   --   ALKPHOS 323*  --   --   BILITOT 2.8*  --   --   BILIDIR 1.7*  --   --   IBILI 1.1*  --   --    Microbiology: [facility] 05/04/21- GAS 05/07/21- Wound culture NG 05/09/21 BC -NG Neck wound/tracheal aspiration with Pseudomonas Studies/Results:   DG Chest Port 1 View  Result Date: 05/23/2021 CLINICAL DATA:  Acute respiratory failure EXAM: PORTABLE CHEST 1 VIEW COMPARISON:  Chest x-ray dated May 21, 2021 FINDINGS: Tracheostomy tube, enteric tube, right IJ line, and left IJ line are unchanged in position. Stable cardiac and mediastinal contours. Bibasilar atelectasis. No focal  consolidation. No large pleural effusion or pneumothorax. IMPRESSION: Stable support devices.  No new parenchymal opacity. Electronically Signed   By: Yetta Glassman M.D.   On: 05/23/2021 08:28   DG Chest Port 1 View  Result Date: 05/21/2021 CLINICAL DATA:  Fever EXAM: PORTABLE CHEST 1 VIEW COMPARISON:  05/20/2021 FINDINGS: Tracheostomy in good position. NG tube in the stomach. Right jugular central venous catheter tip in the right atrium unchanged. Left jugular central venous catheter tip at the mid SVC unchanged. Progression of bilateral airspace disease left greater than right. Progression of small pleural effusions. Negative for pneumothorax. IMPRESSION: Support lines unchanged in position Progressive bilateral airspace disease. This likely represents fluid overload however could represent pneumonia. Electronically Signed   By: Franchot Gallo M.D.   On: 05/21/2021 17:39     Assessment/Plan:  Group A streptococcus bacteremia with invasive infection with septic shock, multiorgan failure.  Necrotizing infection of the left leg.  Status post debridement.  Has wound VAC Patient completed 15 days of appropriate antibiotics   She also received 3 doses of IVIG.  Leukocytosis.   Much improved On zosyn Pseudomonas in culture from the tracheal wound aspirate which could be respiratory secretions coating the tracheostomy  B/l lung infiltrates questioning pneumonia   Encephalopathy: Improved Seizures on Keppra Small subdural hemorrhage.  Normal MRA  AKI on CRRT  Acute hypoxic respiratory failure.  Tracheostomy and is on the vent  Shock liver with transaminitis improving  Anemia due to infection and blood loss from the left leg.  Has received multiple units of PRBC  A-fib now in sinus rhythm.  On amiodarone.  Followed by cardiology.   Discussed management with the care team.

## 2021-05-23 NOTE — Progress Notes (Signed)
NAME:  Rhian Asebedo, MRN:  [id number], DOB:  07-24-1950, LOS: 40 ADMISSION DATE:  05/04/2021  History of present Illness:  71 y.o female with medical history significant of Asthma/COPD, T2DM, HTN, OSA on CPAP, GERD, COVID-19 pneumonia, and Bilateral lower extremity edema who presented to the ED with chief complaint of LLE pain and chills since Thursday.  Patient was given 30 cc/kg of fluids and started on broad-spectrum antibiotics for sepsis with septic shock.   Admitted to the ICU with severe sepsis with shock secondary to cellulitis of LLE septic shock with LLL necrotizing fascitis post op resp failure and severe septic shock with progressive renal failure on CRRT and progressive multiorgan failure  MRI/CT SHOWS SMALL SAH  -05/20/21- Patient is improved she is able to follow communication verbally and move extermities. She is on 5L/min Trache collar. We have stopped levophed and working on reducing vasopressin. She remains on CRRT.  Insulin infusion is ongoing and plan to have weight based regimen initiated. Additionally she will have SLP for vocalization and PMV trial as well as ROM training with OT/PT.  Multiple specialists on case appreciate everyone involved.   05/21/21-patient is improved mildly , she's off CRRT, shes off insulin drip. She is mentating.  She was evaluated by [facility].  She was unable to vocalize with PMV.  She may need PEG tube for nourishment.   05/22/21- patient is overnight worsened with fever and increased levophed dose.  She is negative 1.7L.   She is on zosyn.   05/23/21- patient is improved this am. She is giving thumbs up during examination. 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 She seems volume depleted as well so we will start CVP monitoring and give IV LR 1L bolus today to check for volume responsiveness. Albumin also while on CRRT. PT/OT starting today. Type and  screen repeat due to plan for OR in am. On zosyn with ID following. Starting  heparin today.   Past Medical History     Arthritis   Asthma   COPD (chronic obstructive pulmonary disease) ([place])   Diabetes mellitus without complication ([place])   Endometriosis   GERD (gastroesophageal reflux disease)   Hypertension   Obesity   Sleep apnea    CPAP    Significant Hospital Events   05/04/21: Admitted to the ICU with severe sepsis with shock secondary to cellulitis of LLE 1/23 off pressors +Afib with RVR 1/24 blood pressure low  1/24 to OR for fasciotomy 1/25 post op resp failure with severe septic shock 1/26 remains on pressors 1/27 returns to the OR today for debridement, wound VAC 1/28 overnight with hemodynamic deterioration, required reintubation, currently on pressors 1/29 remains on 3 pressors, CRRT, wound VAC and minimal sedation.  Awakens to voice and touch.  Seen by vascular surgery and awaiting input from general surgery regarding amputation of left leg 1/30 remains on vent remains on pressors 1/31 remains on pressors, on vent, on CRRT 2/1 remains on pressors, on CRRT, MRI/CT shows SAH, afib with RVR, ECHO pending, restarted AMIO at 33 2/2 remains on vent plan for trach tomorrow 2/3 remains on crrt, remains on vent, plan for [facility] and wound vac change in OR 2/3 S/P TRACH,  Implantation of cellular matrix on left leg 2/4 REMAINS ON PRESSORS, REMAINS ON VENT, REMAINS ON CRRT 2/5 REMAINS ON VENT, OFF PRESSORS, REMAINS ON CRRT  Significant Diagnostic Tests:  1/21: Chest Xray> no active  cardiopulmonary process 1/21: CT left lower leg>Diffuse subcutaneous edema may represent cellulitis. No drainable fluid collection/abscess 1/21: Ultrasound lower unilateral left> no DVT   Micro Data:  1/21: SARS-CoV-2 PCR> negative 1/21: Influenza PCR> negative 1/21: Blood culture x2> Streptococcus pyogenes 1/21: Urine Culture> negative 1/21: MRSA PCR>> negative   Antimicrobials:   Vancomycin 1/21> off Cefepime 1/21> off Ceftriaxone 1/21 >> off Penicilli G 1/24>> Linezolid 1/25>>  *Antimicrobials per ID- zosyn now   Objective   Blood pressure (!) 125/53, pulse 93, temperature 99.3 F (37.4 C), temperature source Axillary, resp. rate (!) 27, height _0  (1.651 m), weight 115.1 kg, SpO2 100 %.    Vent Mode: PSV FiO2 (%):  [28 %-30 %] 30 % PEEP:  [5 cmH20] 5 cmH20 Pressure Support:  [5 cmH20] 5 cmH20   Intake/Output Summary (Last 24 hours) at 05/23/2021 1003 Last data filed at 05/23/2021 9528 Gross per 24 hour  Intake 1697.17 ml  Output 1860 ml  Net -162.83 ml    Filed Weights   05/21/21 2000 05/22/21 0500 05/23/21 0500  Weight: 117.4 kg 116.6 kg 115.1 kg    REVIEW OF SYSTEMS  PATIENT IS UNABLE TO PROVIDE COMPLETE REVIEW OF SYSTEMS DUE TO SEVERE CRITICAL ILLNESS AND TOXIC METABOLIC ENCEPHALOPATHY    PHYSICAL EXAMINATION:  GENERAL:critically ill appearing, +resp distress EYES: Pupils equal, round, reactive to light.  No scleral icterus.  MOUTH: Moist mucosal membrane. INTUBATED NECK: Supple.  PULMONARY: Mild rhonchi bilaterally  CARDIOVASCULAR: S1 and S2.  No murmurs  GASTROINTESTINAL: Soft, nontender, -distended. Positive bowel sounds.  MUSCULOSKELETAL: No swelling, clubbing, or edema.  NEUROLOGIC: GCS8T MUSCULOSKELETAL: Left lower extremity with wound VAC in place, surgery redressing.  Status post fasciotomy     Assessment & Plan    Admitted to the ICU with severe sepsis with shock secondary to cellulitis of LLE septic shock with LLL necrotizing fascitis post op resp failure and severe septic shock with progressive renal failure on CRRT and progressive multiorgan failure   Severe ACUTE Hypoxic and Hypercapnic Respiratory Failure       ETIOLOGY IS SEPTIC SHOCK WITH ATELECTASIS, CHF PULMONARY EDEMA AND PLEURAL EFFUSIONS ALL PRESENT ON ADMISSION -continue Mechanical Ventilator support -Wean Fio2 and PEEP as tolerated -VAP/VENT bundle  implementation - Wean PEEP & FiO2 as tolerated, maintain SpO2 > 88% - Head of bed elevated 30 degrees, VAP protocol in place - Plateau pressures less than 30 cm H20  - Intermittent chest x-ray & ABG PRN - Ensure adequate pulmonary hygiene  S/P TRACH WEAN TO PS MODE CONSIDER TCT -SLP and PT/OT ordered -05/20/21  SEPTIC shock SOURCE-LLE-present on admission  Refractory Septic Shock secondary to LLE necrotizing fasciitis Streptococcal toxic shock syndrome use vasopressors to keep MAP>65 as needed -continue antibiotics as prescribed-zyvox and rocephin IV - Status postdebridement, wound VAC per surgery -Vascular and general surgery following S/p IVIG   ACUTE KIDNEY INJURY/Renal Failure -continue Foley Catheter-assess need -Avoid nephrotoxic agents -Follow urine output, BMP -Ensure adequate renal perfusion, optimize oxygenation -Renal dose medications CRRT ongoing - GFR is now normal while being dialyzed  Intake/Output Summary (Last 24 hours) at 05/23/2021 1003 Last data filed at 05/23/2021 4132 Gross per 24 hour  Intake 1697.17 ml  Output 1860 ml  Net -162.83 ml    SHOCK LIVER     - S/P supportive care for sepsis with improved liver enzymes and resolution of transaminitis     - resolved    Severe electrolyte derrangements     - pharmacy consultation       -  improved   Acute encephalopathy  Toxic metabolic encephalopathy Loaded with keppra; continue at 1gram bid MRI/CT HEAD SHOWS SMALL VOLUME [place] APPRECIATE NEUROSURGERY RECS -Neurology consulted NO ANTICOAGULATION FOR  1 MONTH AND THEN NEED TO RE-ASSESS NO ANTIPLATELET AGENTS FOR 1 MONTH AND THEN NEED TO RE-ASSESS CAN POSSIBLY CONSIDER BABY ASA BUT WOULD NEED TO PROCEED WITH CAUTION CAN CONSIDER  DVT PROPHYLAXIS AFTER SURGERY BASED ON RISK OF BLEEDING   ENDO - ICU hypoglycemic\Hyperglycemia protocol -check FSBS per protocol   GI GI PROPHYLAXIS as indicated  NUTRITIONAL STATUS DIET-->TF's as  tolerated Constipation protocol as indicated   ELECTROLYTES -follow labs as needed -replace as needed -pharmacy consultation and following  ENDO - ICU hypoglycemic\Hyperglycemia protocol -check FSBS per protocol   Best practice:  Diet: NPO Pain/Anxiety/Delirium protocol (if indicated): Yes, initiated VAP protocol (if indicated): Yes, initiated DVT prophylaxis: Subcutaneous Heparin GI prophylaxis: PPI Glucose control: ICU hyperglycemia protocol, insulin drip discontinued; SQ insulin Central venous access:  Rt IJV Arterial line:  N/A Foley:  Yes Mobility:  bed rest  PT consulted: N/A Last date of multidisciplinary goals of care discussion [1/27] Code Status:  full code Disposition: ICU    Labs   CBC: Recent Labs  Lab 05/19/21 0421 05/20/21 0550 05/21/21 0402 05/22/21 0526 05/23/21 0500  WBC 25.9* 25.0* 22.7* 17.2* 13.6*  NEUTROABS 18.9* 19.4* 17.5* 12.6* 10.3*  HGB 7.7* 7.8* 7.4* 7.8* 7.4*  HCT 22.6* 22.5* 22.5* 24.1* 22.2*  MCV 83.7 82.4 87.2 87.0 85.1  PLT 127* 161 166 228 248     Basic Metabolic Panel: Recent Labs  Lab 05/18/21 0305 05/18/21 1614 05/19/21 0421 05/19/21 1606 05/20/21 0550 05/20/21 1630 05/21/21 0402 05/22/21 0526 05/22/21 1615 05/23/21 0500  NA 134*   < >  --    < >  --  133* 134* 136 133* 134*  K 4.4   < >  --    < >  --  4.7 4.6 4.3 4.4 3.7  CL 100   < >  --    < >  --  100 101 102 102 100  CO2 23   < >  --    < >  --  _0 GLUCOSE 197*   < >  --    < >  --  238* 134* 94 138* 180*  BUN 38*   < >  --    < >  --  44* 63* 71* 74* 52*  CREATININE 1.12*   < >  --    < >  --  0.93 1.69* 2.34* 2.48* 1.95*  CALCIUM 8.3*   < >  --    < >  --  9.1 9.2 9.2 8.6* 8.9  MG 2.4  --  2.7*  --  2.5*  --  2.6*  --   --  2.2  PHOS 3.4   < >  --    < >  --  2.5 2.8 3.9 4.0 3.2   < > = values in this interval not displayed.    GFR: Estimated Creatinine Clearance: 33 mL/min (A) (by C-G formula based on SCr of 1.95 mg/dL (H)). Recent  Labs  Lab 05/20/21 0550 05/21/21 0402 05/22/21 0526 05/23/21 0500  WBC 25.0* 22.7* 17.2* 13.6*     Liver Function Tests: Recent Labs  Lab 05/17/21 0350 05/17/21 1604 05/21/21 0402 05/22/21 0526 05/22/21 1104 05/22/21 1615 05/23/21 0500  AST 163*  --   --   --  102*  --   --   ALT 326*  --   --   --  138*  --   --   ALKPHOS 452*  --   --   --  323*  --   --   BILITOT 3.6*  --   --   --  2.8*  --   --   PROT 6.2*  --   --   --  6.3*  --   --   ALBUMIN 1.9*   1.9*   < > 2.9* 2.4* 2.4* 2.2* 2.1*   < > = values in this interval not displayed.    Coagulation Profile: Recent Labs  Lab 05/17/21 0350 05/22/21 0526  INR 1.3* 1.2     Cardiac Enzymes: No results for input(s): CKTOTAL, CKMB, CKMBINDEX, TROPONINI in the last 168 hours.   HbA1C: Hgb A1c MFr Bld  Date/Time Value Ref Range Status  05/05/2021 04:53 AM 6.7 (H) 4.8 - 5.6 % Final    Comment:    (NOTE)         Prediabetes: 5.7 - 6.4         Diabetes: >6.4         Glycemic control for adults with diabetes: <7.0   02/11/2021 09:41 AM 6.3 (H) <5.7 % of total Hgb Final    Comment:    For someone without known diabetes, a hemoglobin  A1c value between 5.7% and 6.4% is consistent with prediabetes and should be confirmed with a  follow-up test. . For someone with known diabetes, a value <7% indicates that their diabetes is well controlled. A1c targets should be individualized based on duration of diabetes, age, comorbid conditions, and other considerations. . This assay result is consistent with an increased risk of diabetes. . Currently, no consensus exists regarding use of hemoglobin A1c for diagnosis of diabetes for children. .     CBG: Recent Labs  Lab 05/22/21 2211 05/22/21 2313 05/23/21 0328 05/23/21 0650 05/23/21 0755  GLUCAP 121* 148* 152* 150* 162*    No Known Allergies   Medications  Scheduled Meds:  amiodarone  200 mg Per Tube Daily   chlorhexidine gluconate (MEDLINE KIT)  15 mL  Mouth Rinse BID   Chlorhexidine Gluconate Cloth  6 each Topical Q0600   free water  30 mL Per Tube Q4H   hydrocortisone sod succinate (SOLU-CORTEF) inj  100 mg Intravenous Q12H   insulin aspart  0-9 Units Subcutaneous Q4H   insulin aspart  4 Units Subcutaneous Q4H   insulin detemir  17 Units Subcutaneous BID   magic mouthwash w/lidocaine  10 mL Oral QID   mouth rinse  15 mL Mouth Rinse 10 times per day   midodrine  10 mg Per Tube TID WC   pantoprazole (PROTONIX) IV  40 mg Intravenous Q24H   povidone-Iodine   Topical Daily   sodium chloride flush  10-40 mL Intracatheter Q12H   Continuous Infusions:  sodium chloride Stopped (05/22/21 1937)   erythromycin Stopped (05/23/21 4403)   feeding supplement (PIVOT 1.5 CAL) 1,000 mL (05/23/21 0500)   norepinephrine (LEVOPHED) Adult infusion 14 mcg/min (05/23/21 0900)   piperacillin-tazobactam (ZOSYN)  IV 3.375 g (05/23/21 0914)   prismasol BGK 2/2.5 dialysis solution 1,000 mL/hr at 05/23/21 0911   prismasol BGK 2/2.5 replacement solution 400 mL/hr at 05/23/21 0130   prismasol BGK 2/2.5 replacement solution 400 mL/hr at 05/23/21 0130   PRN Meds:.acetaminophen, artificial tears, docusate, fentaNYL (SUBLIMAZE) injection, heparin, lip balm, midazolam, ondansetron (ZOFRAN) IV,  oxyCODONE, sennosides, sodium chloride, sodium chloride flush    Critical care provider statement:   Total critical care time: 33 minutes   Performed by: Lanney Gins MD   Critical care time was exclusive of separately billable procedures and treating other patients.   Critical care was necessary to treat or prevent imminent or life-threatening deterioration.   Critical care was time spent personally by me on the following activities: development of treatment plan with patient and/or surrogate as well as nursing, discussions with consultants, evaluation of patient's response to treatment, examination of patient, obtaining history from patient or surrogate, ordering and  performing treatments and interventions, ordering and review of laboratory studies, ordering and review of radiographic studies, pulse oximetry and re-evaluation of patient's condition.    Ottie Glazier, M.D.  Pulmonary & Critical Care Medicine

## 2021-05-23 NOTE — Progress Notes (Signed)
[facility]  PROGRESS NOTE   Subjective:   Placed back on CRRT yesterday. Net even UF.   Currently on norepinephrine gtt  Currently anuric.   Tmax 100.6   Objective:  Vital signs in last 24 hours:  Temp:  [99.3 F (37.4 C)-100.6 F (38.1 C)] 99.8 F (37.7 C) (02/09 1125) Pulse Rate:  [70-93] 84 (02/09 1125) Resp:  [15-27] 25 (02/09 1125) BP: (98-128)/(43-70) 107/52 (02/09 1115) SpO2:  [97 %-100 %] 100 % (02/09 1125) FiO2 (%):  [28 %-30 %] 30 % (02/09 0728) Weight:  [115.1 kg] 115.1 kg (02/09 0500)  Weight change: -2 kg Filed Weights   05/21/21 2000 05/22/21 0500 05/23/21 0500  Weight: 117.4 kg 116.6 kg 115.1 kg    Intake/Output: I/O last 3 completed shifts: In: 1973.6 [I.V.:601.4; NG/GT:625; IV Piggyback:747.1] Out: 1991 [Urine:110; Drains:200; FFMBW:4665; Stool:400]   Intake/Output this shift:  Total I/O In: 313.8 [I.V.:80.3; NG/GT:210; IV Piggyback:23.6] Out: 307 [Drains:50; Other:257]  Physical Exam: General:  Critically ill  Head: [place]/AT  Eyes:  Anicteric  Neck:  Trachea collar, tracheostomy  Lungs:   Trach collar, diminished bilaterally  Heart:  regular  Abdomen:   Soft, nontender  Extremities:  No peripheral edema. Left lower extremity wound vac  Neurologic:  Off sedation  Skin:  No lesions  Access: Left IJ temp HD catheter    Basic Metabolic Panel: Recent Labs  Lab 05/18/21 0305 05/18/21 1614 05/19/21 0421 05/19/21 1606 05/20/21 0550 05/20/21 1630 05/21/21 0402 05/22/21 0526 05/22/21 1615 05/23/21 0500  NA 134*   < >  --    < >  --  133* 134* 136 133* 134*  K 4.4   < >  --    < >  --  4.7 4.6 4.3 4.4 3.7  CL 100   < >  --    < >  --  100 101 102 102 100  CO2 23   < >  --    < >  --  '22 22 25 22 26  ' GLUCOSE 197*   < >  --    < >  --  238* 134* 94 138* 180*  BUN 38*   < >  --    < >  --  44* 63* 71* 74* 52*  CREATININE 1.12*   < >  --    < >  --  0.93 1.69* 2.34* 2.48* 1.95*  CALCIUM 8.3*   < >  --    < >  --  9.1 9.2 9.2  8.6* 8.9  MG 2.4  --  2.7*  --  2.5*  --  2.6*  --   --  2.2  PHOS 3.4   < >  --    < >  --  2.5 2.8 3.9 4.0 3.2   < > = values in this interval not displayed.     CBC: Recent Labs  Lab 05/19/21 0421 05/20/21 0550 05/21/21 0402 05/22/21 0526 05/23/21 0500  WBC 25.9* 25.0* 22.7* 17.2* 13.6*  NEUTROABS 18.9* 19.4* 17.5* 12.6* 10.3*  HGB 7.7* 7.8* 7.4* 7.8* 7.4*  HCT 22.6* 22.5* 22.5* 24.1* 22.2*  MCV 83.7 82.4 87.2 87.0 85.1  PLT 127* 161 166 228 248      Urinalysis: No results for input(s): COLORURINE, LABSPEC, PHURINE, GLUCOSEU, HGBUR, BILIRUBINUR, KETONESUR, PROTEINUR, UROBILINOGEN, NITRITE, LEUKOCYTESUR in the last 72 hours.  Invalid input(s): APPERANCEUR    Imaging: DG Chest Port 1 View  Result Date: 05/23/2021 CLINICAL DATA:  Acute respiratory failure EXAM: PORTABLE CHEST 1 VIEW COMPARISON:  Chest x-ray dated May 21, 2021 FINDINGS: Tracheostomy tube, enteric tube, right IJ line, and left IJ line are unchanged in position. Stable cardiac and mediastinal contours. Bibasilar atelectasis. No focal consolidation. No large pleural effusion or pneumothorax. IMPRESSION: Stable support devices.  No new parenchymal opacity. Electronically Signed   By: Yetta Glassman M.D.   On: 05/23/2021 08:28   DG Chest Port 1 View  Result Date: 05/21/2021 CLINICAL DATA:  Fever EXAM: PORTABLE CHEST 1 VIEW COMPARISON:  05/20/2021 FINDINGS: Tracheostomy in good position. NG tube in the stomach. Right jugular central venous catheter tip in the right atrium unchanged. Left jugular central venous catheter tip at the mid SVC unchanged. Progression of bilateral airspace disease left greater than right. Progression of small pleural effusions. Negative for pneumothorax. IMPRESSION: Support lines unchanged in position Progressive bilateral airspace disease. This likely represents fluid overload however could represent pneumonia. Electronically Signed   By: Franchot Gallo M.D.   On: 05/21/2021 17:39      Medications:    sodium chloride Stopped (05/22/21 1937)   albumin human 60 mL/hr at 05/23/21 1100   erythromycin Stopped (05/23/21 9163)   feeding supplement (PIVOT 1.5 CAL)     lactated ringers     norepinephrine (LEVOPHED) Adult infusion 12 mcg/min (05/23/21 1100)   piperacillin-tazobactam (ZOSYN)  IV 12.5 mL/hr at 05/23/21 1100   prismasol BGK 2/2.5 dialysis solution 1,000 mL/hr at 05/23/21 0911   prismasol BGK 2/2.5 replacement solution 400 mL/hr at 05/23/21 0130   prismasol BGK 2/2.5 replacement solution 400 mL/hr at 05/23/21 0130    amiodarone  200 mg Per Tube Daily   chlorhexidine gluconate (MEDLINE KIT)  15 mL Mouth Rinse BID   Chlorhexidine Gluconate Cloth  6 each Topical Q0600   free water  30 mL Per Tube Q4H   heparin injection (subcutaneous)  5,000 Units Subcutaneous Q12H   hydrocortisone sod succinate (SOLU-CORTEF) inj  100 mg Intravenous Q12H   insulin aspart  0-9 Units Subcutaneous Q4H   insulin aspart  4 Units Subcutaneous Q4H   insulin detemir  17 Units Subcutaneous BID   magic mouthwash w/lidocaine  10 mL Oral QID   mouth rinse  15 mL Mouth Rinse 10 times per day   midodrine  10 mg Per Tube TID WC   multivitamin  1 tablet Per Tube QHS   nutrition supplement (JUVEN)  1 packet Per Tube BID BM   pantoprazole (PROTONIX) IV  40 mg Intravenous Q24H   povidone-Iodine   Topical Daily   sodium chloride flush  10-40 mL Intracatheter Q12H    Assessment/ Plan:     Principal Problem:   Severe sepsis with septic shock (CODE) ([place]) Active Problems:   COPD (chronic obstructive pulmonary disease) (HCC)   Morbid obesity ([place])   Diabetes mellitus type 2 in obese ([place])   Atrial fibrillation and flutter ([place])   Necrotizing fasciitis ([employer])   Acute on chronic respiratory failure ([place])   On mechanically assisted ventilation (HCC)   Shock liver   Anemia associated with acute blood loss   Metabolic acidosis   Encounter for continuous renal replacement therapy (CRRT) for  acute renal failure (HCC)   Acute encephalopathy   Group A streptococcal infection   Streptococcal toxic shock syndrome ([place])   Status post tracheostomy ([place])   Subdural hemorrhage ([place])   Left leg cellulitis  [name] is a 71 y.o. black female with COPD/asthma, diabetes mellitus type  II, hypertension, obstructive sleep apnea, paroxysmal atrial fibrillation, GERD, who presents to [facility] on 05/04/2021 for AKI (acute kidney injury) ([place]) [N17.9] Left leg cellulitis [L03.116] Severe sepsis ([place]) [A41.9, R65.20] Severe sepsis with lactic acidosis ([place]) [A41.9, R65.20, E87.20] Foun to have necrotizing fascitis requiring multiple debridements by Dr. Peyton Najjar: 1/24. 1/27, 2/3 and scheduled for 2/10.    #1: Acute kidney injury: AKI is secondary to ischemic nephropathy secondary to hypotension complicated with sepsis. History of bland urine. Baseline creatinine of 0.98, GFR > 60 on 02/11/21. Anuric urine output.  - Continue continuous renal replacement therapy" hemodialifiltration. Net even ultrafiltration.  - IV albumin   #2: Sepsis: necrotizing fasciitis for left lower extremity status post multiple debridement. Requiring vasopressors: norepinephrine. On stress dose steroids and midodrine Continue zosyn. Appreciate ID input  #3. Anemia with kidney injury: hemoglobin 7.4. EPO given on 2/7.   #4. Acute Respiratory Failure: weaning off mechanical ventilation. Status post tracheostomy.   Overall prognosis is guarded. Appreciate critical care, infectious disease, ENT, cardiology, and general surgery.     LOS: [place] 2/9/202311:54 AM

## 2021-05-24 ENCOUNTER — Encounter: Payer: Self-pay | Admitting: General Surgery

## 2021-05-24 ENCOUNTER — Inpatient Hospital Stay: Payer: Medicare HMO | Admitting: Anesthesiology

## 2021-05-24 ENCOUNTER — Other Ambulatory Visit: Payer: Self-pay

## 2021-05-24 ENCOUNTER — Encounter
Admission: EM | Disposition: A | Discharge status: Nursing Home (Includes ICF) | Payer: Self-pay | Source: Home / Self Care | Attending: Internal Medicine

## 2021-05-24 DIAGNOSIS — R6521 Severe sepsis with septic shock: Secondary | ICD-10-CM | POA: Diagnosis not present

## 2021-05-24 DIAGNOSIS — B965 Pseudomonas (aeruginosa) (mallei) (pseudomallei) as the cause of diseases classified elsewhere: Secondary | ICD-10-CM | POA: Diagnosis not present

## 2021-05-24 DIAGNOSIS — B950 Streptococcus, group A, as the cause of diseases classified elsewhere: Secondary | ICD-10-CM | POA: Diagnosis not present

## 2021-05-24 DIAGNOSIS — M726 Necrotizing fasciitis: Secondary | ICD-10-CM | POA: Diagnosis not present

## 2021-05-24 HISTORY — PX: MINOR GRAFT APPLICATION: SHX5978

## 2021-05-24 HISTORY — PX: INCISION AND DRAINAGE OF WOUND: SHX1803

## 2021-05-24 HISTORY — PX: APPLICATION OF WOUND VAC: SHX5189

## 2021-05-24 LAB — RENAL FUNCTION PANEL
Albumin: 2.3 g/dL — ABNORMAL LOW (ref 3.5–5.0)
Albumin: 2.2 g/dL — ABNORMAL LOW (ref 3.5–5.0)
Anion gap: 8 (ref 5–15)
Anion gap: 11 (ref 5–15)
BUN: 54 mg/dL — ABNORMAL HIGH (ref 8–23)
BUN: 55 mg/dL — ABNORMAL HIGH (ref 8–23)
CO2: 24 mmol/L (ref 22–32)
CO2: 21 mmol/L — ABNORMAL LOW (ref 22–32)
Calcium: 9 mg/dL (ref 8.9–10.3)
Calcium: 8.8 mg/dL — ABNORMAL LOW (ref 8.9–10.3)
Chloride: 101 mmol/L (ref 98–111)
Chloride: 101 mmol/L (ref 98–111)
Creatinine, Ser: 1.79 mg/dL — ABNORMAL HIGH (ref 0.44–1.00)
Creatinine, Ser: 1.65 mg/dL — ABNORMAL HIGH (ref 0.44–1.00)
GFR, Estimated: 30 mL/min — ABNORMAL LOW (ref >60)
GFR, Estimated: 33 mL/min — ABNORMAL LOW (ref >60)
Glucose, Bld: 189 mg/dL — ABNORMAL HIGH (ref 70–99)
Glucose, Bld: 202 mg/dL — ABNORMAL HIGH (ref 70–99)
Phosphorus: 3.2 mg/dL (ref 2.5–4.6)
Phosphorus: 3.3 mg/dL (ref 2.5–4.6)
Potassium: 3.6 mmol/L (ref 3.5–5.1)
Potassium: 3.4 mmol/L — ABNORMAL LOW (ref 3.5–5.1)
Sodium: 133 mmol/L — ABNORMAL LOW (ref 135–145)
Sodium: 133 mmol/L — ABNORMAL LOW (ref 135–145)

## 2021-05-24 LAB — PROTIME-INR
INR: 1.1 (ref 0.8–1.2)
Prothrombin Time: 14.1 seconds (ref 11.4–15.2)

## 2021-05-24 LAB — HEMOGLOBIN AND HEMATOCRIT, BLOOD
HCT: 23.9 % — ABNORMAL LOW (ref 36.0–46.0)
HCT: 23 % — ABNORMAL LOW (ref 36.0–46.0)
Hemoglobin: 7.6 g/dL — ABNORMAL LOW (ref 12.0–15.0)
Hemoglobin: 7.4 g/dL — ABNORMAL LOW (ref 12.0–15.0)

## 2021-05-24 LAB — CBC WITH DIFFERENTIAL/PLATELET
Abs Immature Granulocytes: 0.39 10*3/uL — ABNORMAL HIGH (ref 0.00–0.07)
Basophils Absolute: 0.1 10*3/uL (ref 0.0–0.1)
Basophils Relative: 1 %
Eosinophils Absolute: 0 10*3/uL (ref 0.0–0.5)
Eosinophils Relative: 0 %
HCT: 21.7 % — ABNORMAL LOW (ref 36.0–46.0)
Hemoglobin: 6.9 g/dL — ABNORMAL LOW (ref 12.0–15.0)
Immature Granulocytes: 3 %
Lymphocytes Relative: 5 %
Lymphs Abs: 0.6 10*3/uL — ABNORMAL LOW (ref 0.7–4.0)
MCH: 27.8 pg (ref 26.0–34.0)
MCHC: 31.8 g/dL (ref 30.0–36.0)
MCV: 87.5 fL (ref 80.0–100.0)
Monocytes Absolute: 0.7 10*3/uL (ref 0.1–1.0)
Monocytes Relative: 5 %
Neutro Abs: 12 10*3/uL — ABNORMAL HIGH (ref 1.7–7.7)
Neutrophils Relative %: 86 %
Platelets: 268 10*3/uL (ref 150–400)
RBC: 2.48 MIL/uL — ABNORMAL LOW (ref 3.87–5.11)
RDW: 23.9 % — ABNORMAL HIGH (ref 11.5–15.5)
Smear Review: NORMAL
WBC: 13.9 10*3/uL — ABNORMAL HIGH (ref 4.0–10.5)
nRBC: 27.2 % — ABNORMAL HIGH (ref 0.0–0.2)

## 2021-05-24 LAB — GLUCOSE, CAPILLARY
Glucose-Capillary: 180 mg/dL — ABNORMAL HIGH (ref 70–99)
Glucose-Capillary: 152 mg/dL — ABNORMAL HIGH (ref 70–99)
Glucose-Capillary: 120 mg/dL — ABNORMAL HIGH (ref 70–99)
Glucose-Capillary: 164 mg/dL — ABNORMAL HIGH (ref 70–99)
Glucose-Capillary: 180 mg/dL — ABNORMAL HIGH (ref 70–99)
Glucose-Capillary: 160 mg/dL — ABNORMAL HIGH (ref 70–99)

## 2021-05-24 LAB — PREPARE RBC (CROSSMATCH)

## 2021-05-24 LAB — MAGNESIUM: Magnesium: 2.1 mg/dL (ref 1.7–2.4)

## 2021-05-24 SURGERY — IRRIGATION AND DEBRIDEMENT WOUND
Anesthesia: General | Laterality: Left

## 2021-05-24 MED ORDER — VASOPRESSIN 20 UNITS/100 ML INFUSION FOR SHOCK
0.0000 [IU]/min | INTRAVENOUS | Status: DC
  Administered 2021-05-24: 0.03 [IU]/min via INTRAVENOUS
  Filled 2021-05-24 (×2): qty 100

## 2021-05-24 MED ORDER — SODIUM CHLORIDE 0.9 % IV SOLN
80.0000 mg | Freq: Once | INTRAVENOUS | Status: AC
  Administered 2021-05-24: 80 mg
  Filled 2021-05-24: qty 80

## 2021-05-24 MED ORDER — ONDANSETRON HCL 4 MG/2ML IJ SOLN
INTRAMUSCULAR | Status: DC | PRN
  Administered 2021-05-24: 4 mg via INTRAVENOUS

## 2021-05-24 MED ORDER — LIDOCAINE HCL (PF) 2 % IJ SOLN
INTRAMUSCULAR | Status: AC
  Filled 2021-05-24: qty 5

## 2021-05-24 MED ORDER — PROPOFOL 10 MG/ML IV BOLUS
INTRAVENOUS | Status: AC
  Filled 2021-05-24: qty 20

## 2021-05-24 MED ORDER — 0.9 % SODIUM CHLORIDE (POUR BTL) OPTIME
TOPICAL | Status: DC | PRN
  Administered 2021-05-24: 500 mL

## 2021-05-24 MED ORDER — BUPIVACAINE-EPINEPHRINE (PF) 0.25% -1:200000 IJ SOLN
INTRAMUSCULAR | Status: AC
  Filled 2021-05-24: qty 30

## 2021-05-24 MED ORDER — MIDAZOLAM HCL 2 MG/2ML IJ SOLN
INTRAMUSCULAR | Status: DC | PRN
  Administered 2021-05-24 (×2): 1 mg via INTRAVENOUS

## 2021-05-24 MED ORDER — FENTANYL CITRATE (PF) 100 MCG/2ML IJ SOLN
INTRAMUSCULAR | Status: DC | PRN
  Administered 2021-05-24 (×5): 25 ug via INTRAVENOUS

## 2021-05-24 MED ORDER — SODIUM CHLORIDE 0.9% IV SOLUTION: Freq: Once | INTRAVENOUS | Status: DC

## 2021-05-24 MED ORDER — KETAMINE HCL 50 MG/ML IJ SOLN
INTRAMUSCULAR | Status: DC | PRN
Start: 2021-05-24 — End: 2021-05-24
  Administered 2021-05-24 (×3): 10 mg via INTRAMUSCULAR
  Administered 2021-05-24: 5 mg via INTRAMUSCULAR
  Administered 2021-05-24: 15 mg via INTRAMUSCULAR

## 2021-05-24 MED ORDER — ALTEPLASE 2 MG IJ SOLR
2.0000 mg | Freq: Once | INTRAMUSCULAR | Status: AC | PRN
  Administered 2021-05-24: 2 mg

## 2021-05-24 MED ORDER — MIDAZOLAM HCL 2 MG/2ML IJ SOLN
INTRAMUSCULAR | Status: AC
  Filled 2021-05-24: qty 2

## 2021-05-24 MED ORDER — KETAMINE HCL 50 MG/ML IJ SOLN
INTRAMUSCULAR | Status: AC
  Filled 2021-05-24: qty 10

## 2021-05-24 MED ORDER — POTASSIUM CHLORIDE 10 MEQ/50ML IV SOLN
10.0000 meq | INTRAVENOUS | Status: AC
  Administered 2021-05-24 – 2021-05-25 (×4): 10 meq via INTRAVENOUS
  Filled 2021-05-24 (×4): qty 50

## 2021-05-24 MED ORDER — GLYCOPYRROLATE 0.2 MG/ML IJ SOLN
INTRAMUSCULAR | Status: AC
  Filled 2021-05-24: qty 1

## 2021-05-24 MED ORDER — GLYCOPYRROLATE PF 0.2 MG/ML IJ SOSY
PREFILLED_SYRINGE | INTRAMUSCULAR | Status: DC | PRN
Start: 2021-05-24 — End: 2021-05-24
  Administered 2021-05-24: .1 mg via INTRAVENOUS

## 2021-05-24 MED ORDER — ONDANSETRON HCL 4 MG/2ML IJ SOLN
INTRAMUSCULAR | Status: AC
  Filled 2021-05-24: qty 2

## 2021-05-24 MED ORDER — FENTANYL CITRATE (PF) 100 MCG/2ML IJ SOLN
INTRAMUSCULAR | Status: AC
  Filled 2021-05-24: qty 2

## 2021-05-24 MED ORDER — SODIUM CHLORIDE 0.9 % IV SOLN: INTRAVENOUS | Status: DC | PRN

## 2021-05-24 SURGICAL SUPPLY — 50 items
APL PRP STRL LF DISP 70% ISPRP (MISCELLANEOUS)
BLADE CLIPPER SURG (BLADE) ×2 IMPLANT
BLADE SURG 15 STRL LF DISP TIS (BLADE) ×2 IMPLANT
BLADE SURG 15 STRL SS (BLADE) ×3
BNDG GAUZE ELAST 4 BULKY (GAUZE/BANDAGES/DRESSINGS) IMPLANT
CANISTER WOUND CARE 500ML ATS (WOUND CARE) ×1 IMPLANT
CHLORAPREP W/TINT 26 (MISCELLANEOUS) ×2 IMPLANT
DRAIN PENROSE 12X.25 LTX STRL (MISCELLANEOUS) IMPLANT
DRAIN PENROSE 5/8X18 LTX STRL (DRAIN) IMPLANT
DRAPE INCISE 23X17 IOBAN STRL (DRAPES) ×3
DRAPE INCISE 23X17 STRL (DRAPES) IMPLANT
DRAPE INCISE IOBAN 23X17 STRL (DRAPES) ×4 IMPLANT
DRAPE LAPAROTOMY 77X122 PED (DRAPES) ×3 IMPLANT
DRSG MEPITEL 8X12 (GAUZE/BANDAGES/DRESSINGS) ×2 IMPLANT
DRSG VAC ATS LRG SENSATRAC (GAUZE/BANDAGES/DRESSINGS) ×3 IMPLANT
DRSG VAC ATS MED SENSATRAC (GAUZE/BANDAGES/DRESSINGS) ×1 IMPLANT
ELECT REM PT RETURN 9FT ADLT (ELECTROSURGICAL) ×3
ELECTRODE REM PT RTRN 9FT ADLT (ELECTROSURGICAL) ×2 IMPLANT
GAUZE 4X4 16PLY ~~LOC~~+RFID DBL (SPONGE) ×3 IMPLANT
GAUZE SPONGE 4X4 12PLY STRL (GAUZE/BANDAGES/DRESSINGS) IMPLANT
GLOVE SURG ENC MOIS LTX SZ6.5 (GLOVE) ×3 IMPLANT
GLOVE SURG UNDER POLY LF SZ6.5 (GLOVE) ×3 IMPLANT
GOWN STRL REUS W/ TWL LRG LVL3 (GOWN DISPOSABLE) ×4 IMPLANT
GOWN STRL REUS W/TWL LRG LVL3 (GOWN DISPOSABLE) ×6
GRAFT MYRIAD 20X20 (Graft) ×1 IMPLANT
GRAFT MYRIAD 3 LAYER 10X20 (Graft) ×1 IMPLANT
GRAFT MYRIAD 3 LAYER 5X5 (Graft) ×1 IMPLANT
GRAFT MYRIAD 3 LAYER 7X10 (Graft) ×1 IMPLANT
KIT TURNOVER KIT A (KITS) ×3 IMPLANT
MANIFOLD NEPTUNE II (INSTRUMENTS) ×3 IMPLANT
NEEDLE HYPO 22GX1.5 SAFETY (NEEDLE) ×3 IMPLANT
NS IRRIG 1000ML POUR BTL (IV SOLUTION) ×3 IMPLANT
PACK BASIN MINOR ARMC (MISCELLANEOUS) ×3 IMPLANT
PAD ABD DERMACEA PRESS 5X9 (GAUZE/BANDAGES/DRESSINGS) IMPLANT
POWDER MYRIAD MORCELLS 1000MG (Miscellaneous) ×1 IMPLANT
POWDER MYRIAD MORCELLS 500MG (Miscellaneous) ×1 IMPLANT
SOAP 2 % CHG 4 OZ (WOUND CARE) ×1 IMPLANT
SOL PREP PVP 2OZ (MISCELLANEOUS)
SOLUTION PREP PVP 2OZ (MISCELLANEOUS) ×4 IMPLANT
SPONGE T-LAP 18X18 ~~LOC~~+RFID (SPONGE) ×5 IMPLANT
SUT ETHILON 3-0 FS-10 30 BLK (SUTURE)
SUT SILK 2 0 (SUTURE) ×3
SUT SILK 2-0 30XBRD TIE 12 (SUTURE) IMPLANT
SUT VIC AB 3-0 PS2 18 (SUTURE) ×5 IMPLANT
SUTURE EHLN 3-0 FS-10 30 BLK (SUTURE) IMPLANT
SWAB CULTURE AMIES ANAERIB BLU (MISCELLANEOUS) IMPLANT
SYR 10ML LL (SYRINGE) ×3 IMPLANT
SYR BULB IRRIG 60ML STRL (SYRINGE) ×2 IMPLANT
TUBE TRACH 8.0 EXL DIST CUF (TUBING) ×1 IMPLANT
WATER STERILE IRR 500ML POUR (IV SOLUTION) ×2 IMPLANT

## 2021-05-24 NOTE — Progress Notes (Signed)
Patient CRRT off at this time- patient going to surgery.

## 2021-05-24 NOTE — Transfer of Care (Signed)
Immediate Anesthesia Transfer of Care Note  Patient: Alexandra Foster  Procedure(s) Performed: IRRIGATION AND DEBRIDEMENT LEFT LEG (Left) [place]  APPLICATION APPLICATION OF WOUND VAC  Patient Location: ICU  Anesthesia Type:General  Level of Consciousness: awake  Airway & Oxygen Therapy: Patient Spontanous Breathing and Patient connected to tracheostomy mask oxygen  Post-op Assessment: Report given to RN and Post -op Vital signs reviewed and stable  Post vital signs: Reviewed and stable  Last Vitals:  Vitals Value Taken Time  BP 110/63 05/24/21 1236  Temp    Pulse 78 05/24/21 1238  Resp 18 05/24/21 1238  SpO2 98 % 05/24/21 1238  Vitals shown include unvalidated device data.  Last Pain:  Vitals:   05/24/21 0800  TempSrc: Axillary  PainSc: 0-No pain      Patients Stated Pain Goal: 0 (able to nod head in response to yes and no questions) (87/19/59 7471)  Complications: No notable events documented.

## 2021-05-24 NOTE — Consult Note (Signed)
PHARMACY CONSULT NOTE - FOLLOW UP  Pharmacy Consult for Electrolyte Monitoring and Replacement   Recent Labs: Potassium (mmol/L)  Date Value  05/24/2021 3.4 (L)  08/11/2013 3.3 (L)   Magnesium (mg/dL)  Date Value  05/24/2021 2.1   Calcium (mg/dL)  Date Value  05/24/2021 8.8 (L)   Calcium, Total (mg/dL)  Date Value  08/11/2013 9.2   Albumin (g/dL)  Date Value  05/24/2021 2.2 (L)  08/11/2013 3.1 (L)   Phosphorus (mg/dL)  Date Value  05/24/2021 3.3   Sodium (mmol/L)  Date Value  05/24/2021 133 (L)  08/11/2013 138   Assessment: 71 yo female with a previous medical history of diabetes mellitus type 2, hypertension, obstructive sleep apnea on CPAP, asthma/COPD, GERD, history of COVID-19 pneumonia presenting with lower left extremity pain and chills. Patient found to have severe sepsis and strep pyogenes bacteremia on admission and is currently intubated and sedated. LLL necrotizing fascitis. [facility]  Pharmacy has been consulted to monitor and replace electrolytes.  -CRRT stopped 2/6, re-started 2/8>> -s/p trach 2/3, tolerating trach collar trials  Potassium: 3.4  Nutrition:  --Feeds per tube @65ml /hr --Free water per tube 30 mL Q4H  Goal of Therapy:  Electrolytes within normal limits  Plan:  Continues on CRRT; prismasol 2/2.5  --Will order KCl 10 mEq IV x 4 doses  --Renal function panels BID while on CRRT  Darnelle Bos, PharmD Clinical Pharmacist 05/24/2021 7:39 PM

## 2021-05-24 NOTE — Plan of Care (Signed)
Continuing with plan of care. 

## 2021-05-24 NOTE — Progress Notes (Signed)
NAME:  Alexandra Foster, MRN:  [id number], DOB:  07/12/1950, LOS: 39 ADMISSION DATE:  05/04/2021  History of present Illness:  71 y.o female with medical history significant of Asthma/COPD, T2DM, HTN, OSA on CPAP, GERD, COVID-19 pneumonia, and Bilateral lower extremity edema who presented to the ED with chief complaint of LLE pain and chills since Thursday.  Patient was given 30 cc/kg of fluids and started on broad-spectrum antibiotics for sepsis with septic shock.   Admitted to the ICU with severe sepsis with shock secondary to cellulitis of LLE septic shock with LLL necrotizing fascitis post op resp failure and severe septic shock with progressive renal failure on CRRT and progressive multiorgan failure  MRI/CT SHOWS SMALL SAH  -05/20/21- Patient is improved she is able to follow communication verbally and move extermities. She is on 5L/min Trache collar. We have stopped levophed and working on reducing vasopressin. She remains on CRRT.  Insulin infusion is ongoing and plan to have weight based regimen initiated. Additionally she will have SLP for vocalization and PMV trial as well as ROM training with OT/PT.  Multiple specialists on case appreciate everyone involved.   05/21/21-patient is improved mildly , she's off CRRT, shes off insulin drip. She is mentating.  She was evaluated by [facility].  She was unable to vocalize with PMV.  She may need PEG tube for nourishment.   05/22/21- patient is overnight worsened with fever and increased levophed dose.  She is negative 1.7L.   She is on zosyn.   05/23/21- patient is improved this am. She is giving thumbs up during examination. 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 She seems volume depleted as well so we will start CVP monitoring and give IV LR 1L bolus today to check for volume responsiveness. Albumin also while on CRRT. PT/OT starting today. Type and  screen repeat due to plan for OR in am. On zosyn with ID following. Starting [place] heparin today.   05/24/21- patient is s/p repeat surgery on RLE. She is on levophed 55mg post surgery. She is not tachycardic and able to follow some verbal communication.    Past Medical History     Arthritis   Asthma   [place] disease) ([place]   Diabetes mellitus without complication ([place]   Endometriosis   GERD (gastroesophageal reflux disease)   Hypertension   Obesity   Sleep apnea    CPAP    Significant Hospital Events   05/04/21: Admitted to the ICU with severe sepsis with shock secondary to cellulitis of LLE 1/23 off pressors +Afib with RVR 1/24 blood pressure low  1/24 to OR for fasciotomy 1/25 post op resp failure with severe septic shock 1/26 remains on pressors 1/27 returns to the OR today for debridement, wound VAC 1/28 overnight with hemodynamic deterioration, required reintubation, currently on pressors 1/29 remains on 3 pressors, CRRT, wound VAC and minimal sedation.  Awakens to voice and touch.  Seen by vascular surgery and awaiting input from general surgery regarding amputation of left leg 1/30 remains on vent remains on pressors 1/31 remains on pressors, on vent, on CRRT 2/1 remains on pressors, on CRRT, MRI/CT shows SAH, afib with RVR, ECHO pending, restarted AMIO at 33 2/2 remains on vent plan for trach tomorrow 2/3 remains on crrt, remains on vent, [facility] 2/3 S/P TRACH,  Implantation of cellular matrix on left leg  2/4 REMAINS ON PRESSORS, REMAINS ON VENT, REMAINS ON CRRT 2/5 REMAINS ON VENT, OFF PRESSORS, REMAINS ON CRRT  Significant Diagnostic Tests:  1/21: Chest Xray> no active cardiopulmonary process 1/21: CT left lower leg>Diffuse subcutaneous edema may represent cellulitis. No drainable fluid collection/abscess 1/21: Ultrasound lower unilateral left> no DVT   Micro Data:  1/21: SARS-CoV-2 PCR> negative 1/21:  Influenza PCR> negative 1/21: Blood culture x2> Streptococcus pyogenes 1/21: Urine Culture> negative 1/21: MRSA PCR>> negative   Antimicrobials:  Vancomycin 1/21> off Cefepime 1/21> off Ceftriaxone 1/21 >> off Penicilli G 1/24>> Linezolid 1/25>>  *Antimicrobials per ID- zosyn now   Objective   Blood pressure (!) 126/54, pulse 71, temperature 97.7 F (36.5 C), temperature source Axillary, resp. rate (!) 24, height '5\' 5"'  (1.651 m), weight 110.5 kg, SpO2 99 %. CVP:  [1 mmHg-10 mmHg] 2 mmHg  Vent Mode: PSV FiO2 (%):  [28 %-30 %] 28 % PEEP:  [5 cmH20] 5 cmH20 Pressure Support:  [5 cmH20] 5 cmH20   Intake/Output Summary (Last 24 hours) at 05/24/2021 1231 Last data filed at 05/24/2021 1000 Gross per 24 hour  Intake 2912.09 ml  Output 2160 ml  Net 752.09 ml    Filed Weights   05/22/21 0500 05/23/21 0500 05/24/21 0500  Weight: 116.6 kg 115.1 kg 110.5 kg    REVIEW OF SYSTEMS  PATIENT IS UNABLE TO PROVIDE COMPLETE REVIEW OF SYSTEMS DUE TO SEVERE CRITICAL ILLNESS AND TOXIC METABOLIC ENCEPHALOPATHY    PHYSICAL EXAMINATION:  GENERAL:critically ill appearing, +resp distress EYES: Pupils equal, round, reactive to light.  No scleral icterus.  MOUTH: Moist mucosal membrane. INTUBATED NECK: Supple.  PULMONARY: Mild rhonchi bilaterally  CARDIOVASCULAR: S1 and S2.  No murmurs  GASTROINTESTINAL: Soft, nontender, -distended. Positive bowel sounds.  MUSCULOSKELETAL: No swelling, clubbing, or edema.  NEUROLOGIC: GCS8T MUSCULOSKELETAL: Left lower extremity with wound VAC in place, surgery redressing.  Status post fasciotomy     Assessment & Plan    Admitted to the ICU with severe sepsis with shock secondary to cellulitis of LLE septic shock with LLL necrotizing fascitis post op resp failure and severe septic shock with progressive renal failure on CRRT and progressive multiorgan failure   Severe ACUTE Hypoxic and Hypercapnic Respiratory Failure       ETIOLOGY IS SEPTIC SHOCK WITH  ATELECTASIS, CHF PULMONARY EDEMA AND PLEURAL EFFUSIONS ALL PRESENT ON ADMISSION -continue Mechanical Ventilator support -Wean Fio2 and PEEP as tolerated -VAP/VENT bundle implementation - Wean PEEP & FiO2 as tolerated, maintain SpO2 > 88% - Head of bed elevated 30 degrees, VAP protocol in place - Plateau pressures less than 30 cm H20  - Intermittent chest x-ray & ABG PRN - Ensure adequate pulmonary hygiene  S/P TRACH WEAN TO PS MODE CONSIDER TCT -SLP and PT/OT ordered -05/20/21  SEPTIC shock SOURCE-LLE-present on admission  Refractory Septic Shock secondary to LLE necrotizing fasciitis Streptococcal toxic shock syndrome use vasopressors to keep MAP>65 as needed -continue antibiotics as prescribed-zyvox and rocephin IV - Status postdebridement, wound VAC per surgery -Vascular and general surgery following S/p IVIG   ACUTE KIDNEY INJURY/Renal Failure -continue Foley Catheter-assess need -Avoid nephrotoxic agents -Follow urine output, BMP -Ensure adequate renal perfusion, optimize oxygenation -Renal dose medications CRRT ongoing - GFR is now normal while being dialyzed  Intake/Output Summary (Last 24 hours) at 05/24/2021 1231 Last data filed at 05/24/2021 1000 Gross per 24 hour  Intake 2912.09 ml  Output 2160 ml  Net 752.09 ml    SHOCK LIVER     - S/P  supportive care for sepsis with improved liver enzymes and resolution of transaminitis     - resolved    Severe electrolyte derrangements     - pharmacy consultation       -improved   Acute encephalopathy  Toxic metabolic encephalopathy Loaded with keppra; continue at 1gram bid MRI/CT HEAD SHOWS SMALL VOLUME [place] -Neurology consulted NO ANTICOAGULATION FOR  1 MONTH AND THEN NEED TO RE-ASSESS NO ANTIPLATELET AGENTS FOR 1 MONTH AND THEN NEED TO RE-ASSESS CAN POSSIBLY CONSIDER BABY ASA BUT WOULD NEED TO PROCEED WITH CAUTION CAN CONSIDER  DVT PROPHYLAXIS AFTER SURGERY BASED ON RISK OF  BLEEDING   ENDO - ICU hypoglycemic\Hyperglycemia protocol -check FSBS per protocol   GI GI PROPHYLAXIS as indicated  NUTRITIONAL STATUS DIET-->TF's as tolerated Constipation protocol as indicated   ELECTROLYTES -follow labs as needed -replace as needed -pharmacy consultation and following  ENDO - ICU hypoglycemic\Hyperglycemia protocol -check FSBS per protocol   Best practice:  Diet: NPO Pain/Anxiety/Delirium protocol (if indicated): Yes, initiated VAP protocol (if indicated): Yes, initiated DVT prophylaxis: Subcutaneous Heparin GI prophylaxis: PPI Glucose control: ICU hyperglycemia protocol, insulin drip discontinued; SQ insulin Central venous access:  Rt IJV Arterial line:  N/A Foley:  Yes Mobility:  bed rest  PT consulted: N/A Last date of multidisciplinary goals of care discussion [1/27] Code Status:  full code Disposition: ICU    Labs   CBC: Recent Labs  Lab 05/20/21 0550 05/21/21 0402 05/22/21 0526 05/23/21 0500 05/24/21 0335  WBC 25.0* 22.7* 17.2* 13.6* 13.9*  NEUTROABS 19.4* 17.5* 12.6* 10.3* 12.0*  HGB 7.8* 7.4* 7.8* 7.4* 6.9*  HCT 22.5* 22.5* 24.1* 22.2* 21.7*  MCV 82.4 87.2 87.0 85.1 87.5  PLT 161 166 228 248 268     Basic Metabolic Panel: Recent Labs  Lab 05/19/21 0421 05/19/21 1606 05/20/21 0550 05/20/21 1630 05/21/21 0402 05/22/21 0526 05/22/21 1615 05/23/21 0500 05/23/21 1708 05/24/21 0335 05/24/21 0340  NA  --    < >  --    < > 134* 136 133* 134* 133*  --  133*  K  --    < >  --    < > 4.6 4.3 4.4 3.7 3.9  --  3.6  CL  --    < >  --    < > 101 102 102 100 100  --  101  CO2  --    < >  --    < > '22 25 22 26 25  ' --  24  GLUCOSE  --    < >  --    < > 134* 94 138* 180* 246*  --  189*  BUN  --    < >  --    < > 63* 71* 74* 52* 56*  --  54*  CREATININE  --    < >  --    < > 1.69* 2.34* 2.48* 1.95* 1.84*  --  1.79*  CALCIUM  --    < >  --    < > 9.2 9.2 8.6* 8.9 9.2  --  9.0  MG 2.7*  --  2.5*  --  2.6*  --   --  2.2  --  2.1   --   PHOS  --    < >  --    < > 2.8 3.9 4.0 3.2 3.2  --  3.2   < > = values in this interval not displayed.    GFR: Estimated Creatinine  Clearance: 35.2 mL/min (A) (by C-G formula based on SCr of 1.79 mg/dL (H)). Recent Labs  Lab 05/21/21 0402 05/22/21 0526 05/23/21 0500 05/24/21 0335  WBC 22.7* 17.2* 13.6* 13.9*     Liver Function Tests: Recent Labs  Lab 05/22/21 1104 05/22/21 1615 05/23/21 0500 05/23/21 1708 05/24/21 0340  AST 102*  --   --   --   --   ALT 138*  --   --   --   --   ALKPHOS 323*  --   --   --   --   BILITOT 2.8*  --   --   --   --   PROT 6.3*  --   --   --   --   ALBUMIN 2.4* 2.2* 2.1* 2.2* 2.3*    Coagulation Profile: Recent Labs  Lab 05/22/21 0526 05/24/21 0335  INR 1.2 1.1     Cardiac Enzymes: No results for input(s): CKTOTAL, CKMB, CKMBINDEX, TROPONINI in the last 168 hours.   HbA1C: Hgb A1c MFr Bld  Date/Time Value Ref Range Status  05/05/2021 04:53 AM 6.7 (H) 4.8 - 5.6 % Final    Comment:    (NOTE)         Prediabetes: 5.7 - 6.4         Diabetes: >6.4         Glycemic control for adults with diabetes: <7.0   02/11/2021 09:41 AM 6.3 (H) <5.7 % of total Hgb Final    Comment:    For someone without known diabetes, a hemoglobin  A1c value between 5.7% and 6.4% is consistent with prediabetes and should be confirmed with a  follow-up test. . For someone with known diabetes, a value <7% indicates that their diabetes is well controlled. A1c targets should be individualized based on duration of diabetes, age, comorbid conditions, and other considerations. . This assay result is consistent with an increased risk of diabetes. . Currently, no consensus exists regarding use of hemoglobin A1c for diagnosis of diabetes for children. .     CBG: Recent Labs  Lab 05/23/21 1518 05/23/21 1916 05/23/21 2328 05/24/21 0346 05/24/21 0717  GLUCAP 221* 204* 197* 180* 152*    No Known Allergies   Medications  Scheduled Meds:   [MAR Hold] sodium chloride   Intravenous Once   [MAR Hold] amiodarone  200 mg Per Tube Daily   [MAR Hold] chlorhexidine gluconate (MEDLINE KIT)  15 mL Mouth Rinse BID   [MAR Hold] Chlorhexidine Gluconate Cloth  6 each Topical Q0600   [MAR Hold] free water  30 mL Per Tube Q4H   [MAR Hold] hydrocortisone sod succinate (SOLU-CORTEF) inj  100 mg Intravenous Q12H   [MAR Hold] insulin aspart  0-9 Units Subcutaneous Q4H   [MAR Hold] insulin aspart  4 Units Subcutaneous Q4H   [MAR Hold] insulin detemir  17 Units Subcutaneous BID   [MAR Hold] mouth rinse  15 mL Mouth Rinse 10 times per day   [facility] Hold] midodrine  10 mg Per Tube TID WC   [MAR Hold] multivitamin  1 tablet Per Tube QHS   [MAR Hold] nutrition supplement (JUVEN)  1 packet Per Tube BID BM   [MAR Hold] pantoprazole (PROTONIX) IV  40 mg Intravenous Q24H   [MAR Hold] povidone-Iodine   Topical Daily   [MAR Hold] sodium chloride flush  10-40 mL Intracatheter Q12H   Continuous Infusions:  sodium chloride 250 mL (05/24/21 0703)   [MAR Hold] albumin human 12.5 g (05/24/21 0837)  feeding supplement (PIVOT 1.5 CAL) Stopped (05/23/21 2355)   [MAR Hold] norepinephrine (LEVOPHED) Adult infusion 6 mcg/min (05/24/21 1153)   [MAR Hold] piperacillin-tazobactam (ZOSYN)  IV 3.375 g (05/24/21 0840)   prismasol BGK 2/2.5 dialysis solution 1,000 mL/hr at 05/24/21 0847   prismasol BGK 2/2.5 replacement solution 400 mL/hr at 05/23/21 1413   prismasol BGK 2/2.5 replacement solution 400 mL/hr at 05/23/21 1412   PRN Meds:.[MAR Hold] acetaminophen, [MAR Hold] artificial tears, [MAR Hold] docusate, [MAR Hold] fentaNYL (SUBLIMAZE) injection, [MAR Hold] heparin, [MAR Hold] lip balm, [MAR Hold] midazolam, [MAR Hold] ondansetron (ZOFRAN) IV, [MAR Hold] oxyCODONE, [MAR Hold] sennosides, [MAR Hold] sodium chloride, [MAR Hold] sodium chloride flush    Critical care provider statement:   Total critical care time: 33 minutes   Performed by: Lanney Gins MD   Critical  care time was exclusive of separately billable procedures and treating other patients.   Critical care was necessary to treat or prevent imminent or life-threatening deterioration.   Critical care was time spent personally by me on the following activities: development of treatment plan with patient and/or surrogate as well as nursing, discussions with consultants, evaluation of patient's response to treatment, examination of patient, obtaining history from patient or surrogate, ordering and performing treatments and interventions, ordering and review of laboratory studies, ordering and review of radiographic studies, pulse oximetry and re-evaluation of patient's condition.    Ottie Glazier, M.D.  Pulmonary & Critical Care Medicine

## 2021-05-24 NOTE — Progress Notes (Signed)
Patient returned from surgery; report received from nurse.

## 2021-05-24 NOTE — Progress Notes (Signed)
I  Date of Admission:  05/04/2021     ID: Alexandra Foster is a 71 y.o. female  Principal Problem:   Severe sepsis with septic shock (CODE) ([place]) Active Problems:   COPD (chronic obstructive pulmonary disease) (HCC)   Morbid obesity ([place])   Diabetes mellitus type 2 in obese ([place])   Atrial fibrillation and flutter (HCC)   Necrotizing fasciitis ([place])   Acute on chronic respiratory failure ([place])   On mechanically assisted ventilation ([place])   Shock liver   Anemia associated with acute blood loss   Metabolic acidosis   Encounter for continuous renal replacement therapy (CRRT) for acute renal failure (HCC)   Acute encephalopathy   Group A streptococcal infection   Streptococcal toxic shock syndrome ([place])   Status post tracheostomy ([place])   Subdural hemorrhage (HCC)   Left leg cellulitis    Subjective: Underwent debridement of left leg in the OR today  Medications:   sodium chloride   Intravenous Once   amiodarone  200 mg Per Tube Daily   chlorhexidine gluconate (MEDLINE KIT)  15 mL Mouth Rinse BID   Chlorhexidine Gluconate Cloth  6 each Topical Q0600   free water  30 mL Per Tube Q4H   hydrocortisone sod succinate (SOLU-CORTEF) inj  100 mg Intravenous Q12H   insulin aspart  0-9 Units Subcutaneous Q4H   insulin aspart  4 Units Subcutaneous Q4H   insulin detemir  17 Units Subcutaneous BID   mouth rinse  15 mL Mouth Rinse 10 times per day   midodrine  10 mg Per Tube TID WC   multivitamin  1 tablet Per Tube QHS   nutrition supplement (JUVEN)  1 packet Per Tube BID BM   pantoprazole (PROTONIX) IV  40 mg Intravenous Q24H   povidone-Iodine   Topical Daily   sodium chloride flush  10-40 mL Intracatheter Q12H    Objective: Vital signs in last 24 hours: Temp:  [97.7 F (36.5 C)-99.2 F (37.3 C)] 97.8 F (36.6 C) (02/10 1236) Pulse Rate:  [66-87] 66 (02/10 1300) Resp:  [16-40] 19 (02/10 1300) BP: (94-133)/(47-76) 113/68 (02/10 1300) SpO2:  [96 %-100 %] 100 % (02/10  1300) FiO2 (%):  [28 %-30 %] 28 % (02/10 1300) Weight:  [110.5 kg] 110.5 kg (02/10 0500)  PHYSICAL EXAM:  General:sedated trachesotomy Head: Normocephalic, without obvious abnormality, atraumatic. Eyes: Conjunctivae clear, anicteric sclerae. Pupils are equal ENT t- tongue tip looks ischemic with eschar B/l IJ lines On CRRT Lungs: Bilateral air entry  heart: Regular rate and rhythm, no murmur, rub or gallop. Abdomen: Soft, non-tender,not distended. Bowel sounds normal. No masses Extremities: Left leg has wound VAC Picture reviewed        Lymph: Cervical, supraclavicular normal. Neurologic: Cannot be assessed NG tube on tube feeds Foley catheter out anuria   Lab Results Recent Labs    05/23/21 0500 05/23/21 1708 05/24/21 0335 05/24/21 0340  WBC 13.6*  --  13.9*  --   HGB 7.4*  --  6.9*  --   HCT 22.2*  --  21.7*  --   NA 134* 133*  --  133*  K 3.7 3.9  --  3.6  CL 100 100  --  101  CO2 26 25  --  24  BUN 52* 56*  --  54*  CREATININE 1.95* 1.84*  --  1.79*   Liver Panel Recent Labs    05/22/21 1104 05/22/21 1615 05/23/21 1708 05/24/21 0340  PROT 6.3*  --   --   --  ALBUMIN 2.4*   < > 2.2* 2.3*  AST 102*  --   --   --   ALT 138*  --   --   --   ALKPHOS 323*  --   --   --   BILITOT 2.8*  --   --   --   BILIDIR 1.7*  --   --   --   IBILI 1.1*  --   --   --    < > = values in this interval not displayed.   Microbiology: [facility] 05/04/21- GAS 05/07/21- Wound culture NG 05/09/21 BC -NG Neck wound/tracheal aspiration with Pseudomonas Studies/Results:   DG Chest Port 1 View  Result Date: 05/23/2021 CLINICAL DATA:  Acute respiratory failure EXAM: PORTABLE CHEST 1 VIEW COMPARISON:  Chest x-ray dated May 21, 2021 FINDINGS: Tracheostomy tube, enteric tube, right IJ line, and left IJ line are unchanged in position. Stable cardiac and mediastinal contours. Bibasilar atelectasis. No focal consolidation. No large pleural effusion or pneumothorax. IMPRESSION: Stable  support devices.  No new parenchymal opacity. Electronically Signed   By: Yetta Glassman M.D.   On: 05/23/2021 08:28     Assessment/Plan:  Group A streptococcus bacteremia with invasive infection with septic shock, multiorgan failure.  Necrotizing infection of the left leg.  Status post debridement.  Has wound VAC Patient completed 15 days of appropriate antibiotics   She also received 3 doses of IVIG.  Leukocytosis.   Much improved  Pseudomonas in culture from the tracheal wound aspirate which could be respiratory secretions coating the tracheostomy  B/l lung infiltrates questioning pneumonia Continue zosyn  Encephalopathy: Improved Seizures on Keppra Small subdural hemorrhage.  Normal MRA  AKI on CRRT  Acute hypoxic respiratory failure.  Tracheostomy and is on the vent  Shock liver with transaminitis improving  Anemia due to infection and blood loss from the left leg.  Has received multiple units of PRBC  A-fib now in sinus rhythm.  On amiodarone.    Discussed management with the care team. ID will follow her peripherally this weekend - call if needed

## 2021-05-24 NOTE — Anesthesia Preprocedure Evaluation (Signed)
Anesthesia Evaluation  Patient identified by MRN, date of birth, ID band Patient awake    Reviewed: Allergy & Precautions, H&P , NPO status , Patient's Chart, lab work & pertinent test results, reviewed documented beta blocker date and time   Airway Mallampati: II   Neck ROM: full   Comment: Spont. Ventilation with trach collar. 28%fio2 Dental  (+) Poor Dentition   Pulmonary asthma , sleep apnea , COPD, former smoker,    Pulmonary exam normal        Cardiovascular Exercise Tolerance: Poor hypertension, On Medications negative cardio ROS Normal cardiovascular exam Rhythm:regular Rate:Normal     Neuro/Psych negative neurological ROS  negative psych ROS   GI/Hepatic GERD  ,(+) Hepatitis -  Endo/Other  negative endocrine ROSdiabetes  Renal/GU ESRF and DialysisRenal disease  negative genitourinary   Musculoskeletal   Abdominal   Peds  Hematology  (+) Blood dyscrasia, anemia ,   Anesthesia Other Findings Past Medical History: No date: AKI (acute kidney injury) ([place])     Comment:  a. 04/2021 in setting of bacteremia/shock. No date: Arthritis No date: Asthma No date: Bacteremia     Comment:  a. 04/2021 S pyogenes bacteremia in setting of lower ext               cellulitis. No date: COPD (chronic obstructive pulmonary disease) (HCC) No date: Diabetes mellitus without complication (HCC) No date: Endometriosis No date: GERD (gastroesophageal reflux disease) No date: History of echocardiogram     Comment:  a. 07/2013 Echo: EF 55-60%, impaired relaxation, mild TR;              b. 04/2021 Echo: EF 50-55%, mild LVH, nl RV fxn, mild BAE,              Ao sclerosis w/o stenosis. No date: Hypertension No date: Obesity No date: PAF (paroxysmal atrial fibrillation) ([place])     Comment:  a. 04/2021 in setting of septic shock/cellulitis. No date: Sleep apnea     Comment:  CPAP Past Surgical History: No date: ABDOMINAL  HYSTERECTOMY 1/70/0174: APPLICATION OF WOUND VAC     Comment:  Procedure: APPLICATION OF WOUND VAC;  Surgeon:               Herbert Pun, MD;  Location: [facility] ORS;  Service:              General;; 12/16/4965: APPLICATION OF WOUND VAC; Left     Comment:  Procedure: APPLICATION OF WOUND VAC/WOUND VAC               EXCHANGE-[employer];  Surgeon: Herbert Pun,               MD;  Location: [facility] ORS;  Service: General;  Laterality:               Left; 10/29/2006: COLONOSCOPY     Comment:  Dr Allen Norris 11/05/2016: COLONOSCOPY WITH PROPOFOL; N/A     Comment:  Procedure: COLONOSCOPY WITH PROPOFOL;  Surgeon: Robert Bellow, MD;  Location: [facility] ENDOSCOPY;  Service:               Endoscopy;  Laterality: N/A; 05/10/2021: INCISION AND DRAINAGE OF WOUND; Left     Comment:  Procedure: IRRIGATION AND DEBRIDEMENT LEFT LEG;                Surgeon: Herbert Pun, MD;  Location: [facility] ORS;  Service: General;  Laterality: Left; 2002: NASAL SINUS SURGERY     Comment:  Dr Carlis Abbott 05/17/2021: TRACHEOSTOMY TUBE PLACEMENT; N/A     Comment:  Procedure: TRACHEOSTOMY;  Surgeon: Clyde Canterbury, MD;                Location: [facility] ORS;  Service: ENT;  Laterality: N/A; 05/07/2021: WOUND DEBRIDEMENT; Left     Comment:  Procedure: DEBRIDEMENT WOUND;  Surgeon: Herbert Pun, MD;  Location: [facility] ORS;  Service: General;                Laterality: Left; BMI    Body Mass Index: 40.54 kg/m     Reproductive/Obstetrics negative OB ROS                             Anesthesia Physical Anesthesia Plan  ASA: 4 and emergent  Anesthesia Plan: General   Post-op Pain Management:    Induction:   PONV Risk Score and Plan:   Airway Management Planned:   Additional Equipment:   Intra-op Plan:   Post-operative Plan:   Informed Consent: I have reviewed the patients History and Physical, chart, labs and discussed the procedure  including the risks, benefits and alternatives for the proposed anesthesia with the patient or authorized representative who has indicated his/her understanding and acceptance.     Dental Advisory Given  Plan Discussed with: CRNA  Anesthesia Plan Comments:         Anesthesia Quick Evaluation

## 2021-05-24 NOTE — Consult Note (Signed)
Alexandra Foster, Alexandra Foster [id number] 15-Aug-1950 [name], MD  Reason for Consult: Follow-up trach wound Requesting Physician: Ottie Glazier, MD Consulting Physician: [name], MD  HPI: This 71 y.o. year old female was admitted on 05/04/2021 for AKI (acute kidney injury) [facility]) [N17.9] Left leg cellulitis [L03.116] Severe sepsis ([place]) [A41.9, R65.20] Severe sepsis with lactic acidosis ([place]) [A41.9, R65.20, E87.20]. Following up on leukocytosis and trach wound. White count has gone down.  Culture from tracheal wound did show scant Pseudomonas.  Medications:  Current Facility-Administered Medications  Medication Dose Route Frequency Provider Last Rate Last Admin   0.9 %  sodium chloride infusion (Manually program via Guardrails IV Fluids)   Intravenous Once Lang Snow, NP       0.9 %  sodium chloride infusion  250 mL Intravenous Continuous Bennie Pierini, MD 10 mL/hr at 05/24/21 0703 250 mL at 05/24/21 0703   acetaminophen (TYLENOL) tablet 650 mg  650 mg Per NG tube Q6H PRN Ottie Glazier, MD   650 mg at 05/22/21 1551   albumin human 25 % solution 12.5 g  12.5 g Intravenous Daily Ottie Glazier, MD   Stopped at 05/24/21 0929   amiodarone (PACERONE) tablet 200 mg  200 mg Per Tube Daily Darel Hong D, NP   200 mg at 05/24/21 3159   artificial tears (LACRILUBE) ophthalmic ointment   Both Eyes Q4H PRN Flora Lipps, MD   Given at 05/16/21 2313   chlorhexidine gluconate (MEDLINE KIT) (PERIDEX) 0.12 % solution 15 mL  15 mL Mouth Rinse BID Tyler Pita, MD   15 mL at 05/23/21 1934   Chlorhexidine Gluconate Cloth 2 % PADS 6 each  6 each Topical Q0600 Bennie Pierini, MD   6 each at 05/24/21 0900   docusate (COLACE) 50 MG/5ML liquid 100 mg  100 mg Per Tube BID PRN Teressa Lower, NP       feeding supplement (PIVOT 1.5 CAL) liquid 1,000 mL  1,000 mL Per Tube Continuous Ottie Glazier, MD   Stopped at 05/23/21 2355   fentaNYL (SUBLIMAZE) injection 25-50 mcg  25-50 mcg  Intravenous Q2H PRN Ottie Glazier, MD   50 mcg at 05/24/21 1811   free water 30 mL  30 mL Per Tube Q4H Aleskerov, Fuad, MD   30 mL at 05/24/21 1600   heparin injection 1,000-6,000 Units  1,000-6,000 Units CRRT PRN Kolluru, Lurena Nida, MD   1,000 Units at 05/24/21 1304   hydrocortisone sodium succinate (SOLU-CORTEF) 100 MG injection 100 mg  100 mg Intravenous Q12H Darel Hong D, NP   100 mg at 05/24/21 4585   insulin aspart (novoLOG) injection 0-9 Units  0-9 Units Subcutaneous Q4H Ottie Glazier, MD   2 Units at 05/24/21 1735   insulin aspart (novoLOG) injection 4 Units  4 Units Subcutaneous Q4H Ottie Glazier, MD   4 Units at 05/24/21 1736   insulin detemir (LEVEMIR) injection 17 Units  17 Units Subcutaneous BID Ottie Glazier, MD   17 Units at 05/24/21 9292   lip balm (BLISTEX) ointment   Topical PRN Flora Lipps, MD   Given at 05/23/21 1940   MEDLINE mouth rinse  15 mL Mouth Rinse 10 times per day Tyler Pita, MD   15 mL at 05/24/21 1457   midazolam (VERSED) injection 2 mg  2 mg Intravenous Q1H PRN Rust-Chester, Britton L, NP   2 mg at 05/24/21 0037   midodrine (PROAMATINE) tablet 10 mg  10 mg Per Tube TID WC Flora Lipps, MD  10 mg at 05/24/21 1735   multivitamin (RENA-VIT) tablet 1 tablet  1 tablet Per Tube QHS Ottie Glazier, MD   1 tablet at 05/23/21 2123   norepinephrine (LEVOPHED) 16 mg in 237m premix infusion  0-40 mcg/min Intravenous Titrated Kolluru, Sarath, MD 18.75 mL/hr at 05/24/21 1800 20 mcg/min at 05/24/21 1800   nutrition supplement (JUVEN) (JUVEN) powder packet 1 packet  1 packet Per Tube BID BM AOttie Glazier MD   1 packet at 05/24/21 1331   ondansetron (ZOFRAN) injection 4 mg  4 mg Intravenous Q6H PRN GTyler Pita MD   4 mg at 05/20/21 0936   oxyCODONE (Oxy IR/ROXICODONE) immediate release tablet 5-10 mg  5-10 mg Per Tube Q6H PRN AOttie Glazier MD   10 mg at 05/20/21 1057   pantoprazole (PROTONIX) injection 40 mg  40 mg Intravenous Q24H Rust-Chester,  Britton L, NP   40 mg at 05/24/21 0424   piperacillin-tazobactam (ZOSYN) IVPB 3.375 g  3.375 g Intravenous Q8H Ravishankar, JJoellyn Quails MD 12.5 mL/hr at 05/24/21 1800 Infusion Verify at 05/24/21 1800   povidone-Iodine (BETADINE) 5 % topical solution   Topical Daily KBradly Bienenstock NP   Given at 05/22/21 1500   prismasol BGK 2/2.5 dialysis solution   CRRT Continuous Kolluru, Sarath, MD 1,000 mL/hr at 05/24/21 1845 New Bag at 05/24/21 1845   prismasol BGK 2/2.5 replacement solution   CRRT Continuous Kolluru, Sarath, MD 400 mL/hr at 05/23/21 1413 New Bag at 05/23/21 1413   prismasol BGK 2/2.5 replacement solution   CRRT Continuous Kolluru, Sarath, MD 400 mL/hr at 05/23/21 1412 New Bag at 05/23/21 1412   sennosides (SENOKOT) 8.8 MG/5ML syrup 10 mL  10 mL Per Tube QHS PRN Tukov-Yual, Magdalene S, NP   10 mL at 05/14/21 2105   sodium chloride 0.9 % primer fluid for CRRT   CRRT PRN Kolluru, Sarath, MD       sodium chloride flush (NS) 0.9 % injection 10-40 mL  10-40 mL Intracatheter Q12H Arida, Muhammad A, MD   10 mL at 05/24/21 0900   sodium chloride flush (NS) 0.9 % injection 10-40 mL  10-40 mL Intracatheter PRN AWellington Hampshire MD       vasopressin (PITRESSIN) 20 Units in sodium chloride 0.9 % 100 mL infusion-*FOR SHOCK*  0-0.03 Units/min Intravenous Continuous Rust-Chester, BHuel Cote NP      .  Medications Prior to Admission  Medication Sig Dispense Refill   albuterol (PROVENTIL) (2.5 MG/3ML) 0.083% nebulizer solution Take 3 mLs (2.5 mg total) by nebulization every 6 (six) hours as needed for wheezing or shortness of breath. 150 mL 1   aspirin 81 MG tablet Take 1 tablet (81 mg total) by mouth daily. 30 tablet 0   atorvastatin (LIPITOR) 40 MG tablet Take 1 tablet (40 mg total) by mouth daily. 90 tablet 1   azelastine (ASTELIN) 0.1 % nasal spray Place 2 sprays into both nostrils 2 (two) times daily as needed for rhinitis or allergies. 30 mL 1   baclofen (LIORESAL) 10 MG tablet Take 10 mg by mouth  daily.     celecoxib (CELEBREX) 200 MG capsule Take 200 mg by mouth daily.     Cetirizine HCl (ZYRTEC ALLERGY) 10 MG TBDP Take 10 mg by mouth at bedtime. For sinus symptoms and seasonal allergies 30 tablet 1   docusate sodium (COLACE) 100 MG capsule Take 100 mg by mouth daily.     fluticasone (FLONASE) 50 MCG/ACT nasal spray      glucose blood (ACCU-CHEK  AVIVA PLUS) test strip Use as instructed 100 each 12   Lancets (ACCU-CHEK SOFT TOUCH) lancets Use as instructed 100 each 12   meloxicam (MOBIC) 15 MG tablet Take 15 mg by mouth daily.     metFORMIN (GLUCOPHAGE-XR) 750 MG 24 hr tablet Take 1 tablet (750 mg total) by mouth daily with breakfast. 90 tablet 1   montelukast (SINGULAIR) 10 MG tablet Take 1 tablet (10 mg total) by mouth at bedtime. 90 tablet 1   Multiple Vitamins-Minerals (MULTIVITAL PO) Take 1 tablet by mouth daily.     omeprazole (PRILOSEC) 20 MG capsule Take 1 capsule (20 mg total) by mouth daily. 90 capsule 1   ORTHOVISC 30 MG/2ML SOSY      triamterene-hydrochlorothiazide (DYAZIDE) 37.5-25 MG capsule TAKE 1 CAPSULE EVERY DAY 90 capsule 1   vitamin B-12 (CYANOCOBALAMIN) 100 MCG tablet Take 100 mcg by mouth daily.     calcium carbonate (OSCAL) 1500 (600 Ca) MG TABS tablet Take 600 mg of elemental calcium by mouth 2 (two) times daily with a meal. (Patient not taking: Reported on 05/05/2021)     Fluticasone-Umeclidin-Vilant (TRELEGY ELLIPTA) 100-62.5-25 MCG/INH AEPB Inhale 1 puff into the lungs daily. In place of Stiolto 180 each 1   loratadine (CLARITIN) 10 MG tablet Take 1 tablet (10 mg total) by mouth daily. 90 tablet 1    Allergies: No Known Allergies  PMH:  Past Medical History:  Diagnosis Date   AKI (acute kidney injury) ([place])    a. 04/2021 in setting of bacteremia/shock.   Arthritis    Asthma    Bacteremia    a. 04/2021 S pyogenes bacteremia in setting of lower ext cellulitis.   COPD (chronic obstructive pulmonary disease) (H[facility])    Diabetes mellitus without complication  (H[facility])    Endometriosis    GERD (gastroesophageal reflux disease)    History of echocardiogram    a. 07/2013 Echo: EF 55-60%, impaired relaxation, mild TR; b. 04/2021 Echo: EF 50-55%, mild LVH, nl RV fxn, mild BAE, Ao sclerosis w/o stenosis.   Hypertension    Obesity    PAF (paroxysmal atrial fibrillation) ([place])    a. 04/2021 in setting of septic shock/cellulitis.   Sleep apnea    CPAP    Fam Hx:  Family History  Problem Relation Age of Onset   Congestive Heart Failure Mother    Coronary artery disease Father 29   Breast cancer Sister 39   Heart disease Brother    Varicose Veins Brother    Alcohol abuse Brother     Soc Hx:  Social History   Socioeconomic History   Marital status: Married    Spouse name: Trilby Drummer   Number of children: 1   Years of education: 12   Highest education level: 12th grade  Occupational History    Employer: RETIRED  Tobacco Use   Smoking status: Former    Packs/day: 2.00    Years: 40.00    Pack years: 80.00    Types: Cigarettes    Quit date: 2003    Years since quitting: 20.1   Smokeless tobacco: Former    Types: Snuff    Quit date: 04/2001   Tobacco comments:    smoking cessation materials not required  Vaping Use   Vaping Use: Never used  Substance and Sexual Activity   Alcohol use: No    Alcohol/week: 0.0 standard drinks   Drug use: No   Sexual activity: Not Currently  Other Topics Concern   Not on file  Social History Narrative   Lives locally w/ husband.  Does not routinely exercise.   Social Determinants of Health   Financial Resource Strain: Medium Risk   Difficulty of Paying Living Expenses: Somewhat hard  Food Insecurity: No Food Insecurity   Worried About Charity fundraiser in the Last Year: Never true   Ran Out of Food in the Last Year: Never true  Transportation Needs: No Transportation Needs   Lack of Transportation (Medical): No   Lack of Transportation (Non-Medical): No  Physical Activity: Inactive   Days of  Exercise per Week: 0 days   Minutes of Exercise per Session: 0 min  Stress: No Stress Concern Present   Feeling of Stress : Not at all  Social Connections: Moderately Integrated   Frequency of Communication with Friends and Family: More than three times a week   Frequency of Social Gatherings with Friends and Family: More than three times a week   Attends Religious Services: More than 4 times per year   Active Member of Clubs or Organizations: No   Attends Archivist Meetings: Never   Marital Status: Married  Human resources officer Violence: Not At Risk   Fear of Current or Ex-Partner: No   Emotionally Abused: No   Physically Abused: No   Sexually Abused: No    PSH:  Past Surgical History:  Procedure Laterality Date   ABDOMINAL HYSTERECTOMY     APPLICATION OF WOUND VAC  05/10/2021   Procedure: APPLICATION OF WOUND VAC;  Surgeon: Herbert Pun, MD;  Location: [facility];  Service: General;;   APPLICATION OF WOUND VAC Left 05/17/2021   Procedure: APPLICATION OF WOUND VAC/WOUND VAC EXCHANGE-Matrix Myriad;  Surgeon: Herbert Pun, MD;  Location: [facility];  Service: General;  Laterality: Left;   APPLICATION OF WOUND VAC  05/24/2021   Procedure: APPLICATION OF WOUND VAC;  Surgeon: Herbert Pun, MD;  Location: [facility];  Service: General;;   COLONOSCOPY  10/29/2006   Dr Allen Norris   COLONOSCOPY WITH PROPOFOL N/A 11/05/2016   Procedure: COLONOSCOPY WITH PROPOFOL;  Surgeon: Robert Bellow, MD;  Location: [facility];  Service: Endoscopy;  Laterality: N/A;   INCISION AND DRAINAGE OF WOUND Left 05/10/2021   Procedure: IRRIGATION AND DEBRIDEMENT LEFT LEG;  Surgeon: Herbert Pun, MD;  Location: [facility];  Service: General;  Laterality: Left;   INCISION AND DRAINAGE OF WOUND Left 05/24/2021   Procedure: IRRIGATION AND DEBRIDEMENT LEFT LEG;  Surgeon: Herbert Pun, MD;  Location: [facility];  Service: General;  Laterality: Left;   MINOR GRAFT APPLICATION   0/81/4481   Procedure: Myriad Matrix  APPLICATION;  Surgeon: Herbert Pun, MD;  Location: [facility];  Service: General;;   NASAL SINUS SURGERY  2002   Dr Carlis Abbott   TRACHEOSTOMY TUBE PLACEMENT N/A 05/17/2021   Procedure: TRACHEOSTOMY;  Surgeon: Clyde Canterbury, MD;  Location: [facility];  Service: ENT;  Laterality: N/A;   WOUND DEBRIDEMENT Left 05/07/2021   Procedure: DEBRIDEMENT WOUND;  Surgeon: Herbert Pun, MD;  Location: [facility];  Service: General;  Laterality: Left;  . Procedures since admission: No admission procedures for hospital encounter.  ROS: Review of systems normal other than 12 systems except per HPI.  PHYSICAL EXAM  Vitals: Blood pressure (!) 119/48, pulse 89, temperature 97.8 F (36.6 C), temperature source Axillary, resp. rate (!) 23, height '5\' 5"'  (1.651 m), weight 119.7 kg, SpO2 100 %.. Neck: Tracheostomy wound seems a little smaller with some progress with granulation.  Still a lot of secretions in the  wound and there was not a wet to dry dressing in place, just dry split gauze  MEDICAL DECISION MAKING: Data Review:  Results for orders placed or performed during the hospital encounter of 05/04/21 (from the past 48 hour(s))  Glucose, capillary     Status: Abnormal   Collection Time: 05/22/21 10:11 PM  Result Value Ref Range   Glucose-Capillary 121 (H) 70 - 99 mg/dL    Comment: Glucose reference range applies only to samples taken after fasting for at least 8 hours.  Glucose, capillary     Status: Abnormal   Collection Time: 05/22/21 11:13 PM  Result Value Ref Range   Glucose-Capillary 148 (H) 70 - 99 mg/dL    Comment: Glucose reference range applies only to samples taken after fasting for at least 8 hours.   Comment 1 Notify RN    Comment 2 Document in Chart   Glucose, capillary     Status: Abnormal   Collection Time: 05/23/21  3:28 AM  Result Value Ref Range   Glucose-Capillary 152 (H) 70 - 99 mg/dL    Comment: Glucose reference range applies only to  samples taken after fasting for at least 8 hours.   Comment 1 Notify RN    Comment 2 Document in Chart   CBC with Differential/Platelet     Status: Abnormal   Collection Time: 05/23/21  5:00 AM  Result Value Ref Range   WBC 13.6 (H) 4.0 - 10.5 K/uL    Comment: WHITE COUNT CONFIRMED ON SMEAR   RBC 2.61 (L) 3.87 - 5.11 MIL/uL   Hemoglobin 7.4 (L) 12.0 - 15.0 g/dL   HCT 22.2 (L) 36.0 - 46.0 %   MCV 85.1 80.0 - 100.0 fL   MCH 28.4 26.0 - 34.0 pg   MCHC 33.3 30.0 - 36.0 g/dL   RDW 23.6 (H) 11.5 - 15.5 %   Platelets 248 150 - 400 K/uL   nRBC 31.8 (H) 0.0 - 0.2 %   Neutrophils Relative % 75 %   Neutro Abs 10.3 (H) 1.7 - 7.7 K/uL   Lymphocytes Relative 7 %   Lymphs Abs 1.0 0.7 - 4.0 K/uL   Monocytes Relative 6 %   Monocytes Absolute 0.8 0.1 - 1.0 K/uL   Eosinophils Relative 4 %   Eosinophils Absolute 0.5 0.0 - 0.5 K/uL   Basophils Relative 1 %   Basophils Absolute 0.1 0.0 - 0.1 K/uL   Immature Granulocytes 7 %   Abs Immature Granulocytes 0.94 (H) 0.00 - 0.07 K/uL    Comment: Performed at [facility], [address] [place], [place] [address]  Renal function panel (daily at 0500)     Status: Abnormal   Collection Time: 05/23/21  5:00 AM  Result Value Ref Range   Sodium 134 (L) 135 - 145 mmol/L   Potassium 3.7 3.5 - 5.1 mmol/L   Chloride 100 98 - 111 mmol/L   CO2 26 22 - 32 mmol/L   Glucose, Bld 180 (H) 70 - 99 mg/dL    Comment: Glucose reference range applies only to samples taken after fasting for at least 8 hours.   BUN 52 (H) 8 - 23 mg/dL   Creatinine, Ser 1.95 (H) 0.44 - 1.00 mg/dL   Calcium 8.9 8.9 - 10.3 mg/dL   Phosphorus 3.2 2.5 - 4.6 mg/dL   Albumin 2.1 (L) 3.5 - 5.0 g/dL   GFR, Estimated 27 (L) >60 mL/min    Comment: (NOTE) Calculated using the CKD-EPI Creatinine Equation (2021)  Anion gap 8 5 - 15    Comment: Performed at [facility], [address] [place], [place] [address]  Magnesium     Status: None   Collection Time: 05/23/21  5:00  AM  Result Value Ref Range   Magnesium 2.2 1.7 - 2.4 mg/dL    Comment: Performed at [facility], [address] [place], [place] [address]  Glucose, capillary     Status: Abnormal   Collection Time: 05/23/21  6:50 AM  Result Value Ref Range   Glucose-Capillary 150 (H) 70 - 99 mg/dL    Comment: Glucose reference range applies only to samples taken after fasting for at least 8 hours.   Comment 1 Notify RN    Comment 2 Document in Chart   Glucose, capillary     Status: Abnormal   Collection Time: 05/23/21  7:55 AM  Result Value Ref Range   Glucose-Capillary 162 (H) 70 - 99 mg/dL    Comment: Glucose reference range applies only to samples taken after fasting for at least 8 hours.  Glucose, capillary     Status: Abnormal   Collection Time: 05/23/21 11:22 AM  Result Value Ref Range   Glucose-Capillary 150 (H) 70 - 99 mg/dL    Comment: Glucose reference range applies only to samples taken after fasting for at least 8 hours.  Type and screen [place]     Status: None (Preliminary result)   Collection Time: 05/23/21 11:47 AM  Result Value Ref Range   ABO/RH(D) O POS    Antibody Screen NEG    Sample Expiration 05/26/2021,2359    Unit Number [id number]    Blood Component Type RED CELLS,LR    Unit division 00    Status of Unit ISSUED    Transfusion Status OK TO TRANSFUSE    Crossmatch Result      Compatible Performed at [facility], [address] [place], [place] [address]   Glucose, capillary     Status: Abnormal   Collection Time: 05/23/21 11:56 AM  Result Value Ref Range   Glucose-Capillary 155 (H) 70 - 99 mg/dL    Comment: Glucose reference range applies only to samples taken after fasting for at least 8 hours.  Glucose, capillary     Status: Abnormal   Collection Time: 05/23/21  3:18 PM  Result Value Ref Range   Glucose-Capillary 221 (H) 70 - 99 mg/dL    Comment: Glucose reference range applies only to samples taken after  fasting for at least 8 hours.  Renal function panel (daily at 1600)     Status: Abnormal   Collection Time: 05/23/21  5:08 PM  Result Value Ref Range   Sodium 133 (L) 135 - 145 mmol/L   Potassium 3.9 3.5 - 5.1 mmol/L   Chloride 100 98 - 111 mmol/L   CO2 25 22 - 32 mmol/L   Glucose, Bld 246 (H) 70 - 99 mg/dL    Comment: Glucose reference range applies only to samples taken after fasting for at least 8 hours.   BUN 56 (H) 8 - 23 mg/dL   Creatinine, Ser 1.84 (H) 0.44 - 1.00 mg/dL   Calcium 9.2 8.9 - 10.3 mg/dL   Phosphorus 3.2 2.5 - 4.6 mg/dL   Albumin 2.2 (L) 3.5 - 5.0 g/dL   GFR, Estimated 29 (L) >60 mL/min    Comment: (NOTE) Calculated using the CKD-EPI Creatinine Equation (2021)    Anion gap 8 5 - 15  Comment: Performed at [facility], [address] [place], [place] [address]  Glucose, capillary     Status: Abnormal   Collection Time: 05/23/21  7:16 PM  Result Value Ref Range   Glucose-Capillary 204 (H) 70 - 99 mg/dL    Comment: Glucose reference range applies only to samples taken after fasting for at least 8 hours.   Comment 1 Notify RN    Comment 2 Document in Chart   Glucose, capillary     Status: Abnormal   Collection Time: 05/23/21 11:28 PM  Result Value Ref Range   Glucose-Capillary 197 (H) 70 - 99 mg/dL    Comment: Glucose reference range applies only to samples taken after fasting for at least 8 hours.  CBC with Differential/Platelet     Status: Abnormal   Collection Time: 05/24/21  3:35 AM  Result Value Ref Range   WBC 13.9 (H) 4.0 - 10.5 K/uL   RBC 2.48 (L) 3.87 - 5.11 MIL/uL   Hemoglobin 6.9 (L) 12.0 - 15.0 g/dL   HCT 21.7 (L) 36.0 - 46.0 %   MCV 87.5 80.0 - 100.0 fL   MCH 27.8 26.0 - 34.0 pg   MCHC 31.8 30.0 - 36.0 g/dL   RDW 23.9 (H) 11.5 - 15.5 %   Platelets 268 150 - 400 K/uL   nRBC 27.2 (H) 0.0 - 0.2 %   Neutrophils Relative % 86 %   Neutro Abs 12.0 (H) 1.7 - 7.7 K/uL   Lymphocytes Relative 5 %   Lymphs Abs 0.6 (L) 0.7 - 4.0 K/uL    Monocytes Relative 5 %   Monocytes Absolute 0.7 0.1 - 1.0 K/uL   Eosinophils Relative 0 %   Eosinophils Absolute 0.0 0.0 - 0.5 K/uL   Basophils Relative 1 %   Basophils Absolute 0.1 0.0 - 0.1 K/uL   WBC Morphology TOXIC GRANULATION    Smear Review Normal platelet morphology    Immature Granulocytes 3 %   Abs Immature Granulocytes 0.39 (H) 0.00 - 0.07 K/uL   Polychromasia PRESENT    Target Cells PRESENT     Comment: Performed at [facility], [address] [place], [place] [address]  Magnesium     Status: None   Collection Time: 05/24/21  3:35 AM  Result Value Ref Range   Magnesium 2.1 1.7 - 2.4 mg/dL    Comment: Performed at [facility], [address] [place], [place] [address]  Protime-INR     Status: None   Collection Time: 05/24/21  3:35 AM  Result Value Ref Range   Prothrombin Time 14.1 11.4 - 15.2 seconds   INR 1.1 0.8 - 1.2    Comment: (NOTE) INR goal varies based on device and disease states. Performed at [facility], [address] [place], [place] [address]   Renal function panel (daily at 0500)     Status: Abnormal   Collection Time: 05/24/21  3:40 AM  Result Value Ref Range   Sodium 133 (L) 135 - 145 mmol/L   Potassium 3.6 3.5 - 5.1 mmol/L   Chloride 101 98 - 111 mmol/L   CO2 24 22 - 32 mmol/L   Glucose, Bld 189 (H) 70 - 99 mg/dL    Comment: Glucose reference range applies only to samples taken after fasting for at least 8 hours.   BUN 54 (H) 8 - 23 mg/dL   Creatinine, Ser 1.79 (H) 0.44 - 1.00 mg/dL   Calcium 9.0 8.9 - 10.3 mg/dL  Phosphorus 3.2 2.5 - 4.6 mg/dL   Albumin 2.3 (L) 3.5 - 5.0 g/dL   GFR, Estimated 30 (L) >60 mL/min    Comment: (NOTE) Calculated using the CKD-EPI Creatinine Equation (2021)    Anion gap 8 5 - 15    Comment: Performed at [facility], [address] [place], [place] [address]  Glucose, capillary     Status: Abnormal   Collection Time: 05/24/21  3:46 AM  Result Value Ref Range    Glucose-Capillary 180 (H) 70 - 99 mg/dL    Comment: Glucose reference range applies only to samples taken after fasting for at least 8 hours.   Comment 1 QC Due   Prepare RBC (crossmatch)     Status: None   Collection Time: 05/24/21  5:30 AM  Result Value Ref Range   Order Confirmation      ORDER PROCESSED BY BLOOD BANK Performed at [facility], [address] [place], [place] [address]   Glucose, capillary     Status: Abnormal   Collection Time: 05/24/21  7:17 AM  Result Value Ref Range   Glucose-Capillary 152 (H) 70 - 99 mg/dL    Comment: Glucose reference range applies only to samples taken after fasting for at least 8 hours.  Glucose, capillary     Status: Abnormal   Collection Time: 05/24/21 12:41 PM  Result Value Ref Range   Glucose-Capillary 120 (H) 70 - 99 mg/dL    Comment: Glucose reference range applies only to samples taken after fasting for at least 8 hours.  Hemoglobin and hematocrit, blood     Status: Abnormal   Collection Time: 05/24/21  1:00 PM  Result Value Ref Range   Hemoglobin 7.6 (L) 12.0 - 15.0 g/dL   HCT 23.9 (L) 36.0 - 46.0 %    Comment: Performed at [facility], [address] [place], [place] [address]  Glucose, capillary     Status: Abnormal   Collection Time: 05/24/21  5:19 PM  Result Value Ref Range   Glucose-Capillary 164 (H) 70 - 99 mg/dL    Comment: Glucose reference range applies only to samples taken after fasting for at least 8 hours.  Renal function panel (daily at 1600)     Status: Abnormal   Collection Time: 05/24/21  5:42 PM  Result Value Ref Range   Sodium 133 (L) 135 - 145 mmol/L   Potassium 3.4 (L) 3.5 - 5.1 mmol/L   Chloride 101 98 - 111 mmol/L   CO2 21 (L) 22 - 32 mmol/L   Glucose, Bld 202 (H) 70 - 99 mg/dL    Comment: Glucose reference range applies only to samples taken after fasting for at least 8 hours.   BUN 55 (H) 8 - 23 mg/dL   Creatinine, Ser 1.65 (H) 0.44 - 1.00 mg/dL   Calcium 8.8 (L) 8.9 - 10.3  mg/dL   Phosphorus 3.3 2.5 - 4.6 mg/dL   Albumin 2.2 (L) 3.5 - 5.0 g/dL   GFR, Estimated 33 (L) >60 mL/min    Comment: (NOTE) Calculated using the CKD-EPI Creatinine Equation (2021)    Anion gap 11 5 - 15    Comment: Performed at [facility], [address] [place], [place] [address]  Hemoglobin and hematocrit, blood     Status: Abnormal   Collection Time: 05/24/21  5:42 PM  Result Value Ref Range   Hemoglobin 7.4 (L) 12.0 - 15.0 g/dL   HCT 23.0 (L) 36.0 - 46.0 %  Comment: Performed at [facility], [address] [place], [place] [address]  Glucose, capillary     Status: Abnormal   Collection Time: 05/24/21  7:37 PM  Result Value Ref Range   Glucose-Capillary 180 (H) 70 - 99 mg/dL    Comment: Glucose reference range applies only to samples taken after fasting for at least 8 hours.  . DG Chest Port 1 View  Result Date: 05/23/2021 CLINICAL DATA:  Acute respiratory failure EXAM: PORTABLE CHEST 1 VIEW COMPARISON:  Chest x-ray dated May 21, 2021 FINDINGS: Tracheostomy tube, enteric tube, right IJ line, and left IJ line are unchanged in position. Stable cardiac and mediastinal contours. Bibasilar atelectasis. No focal consolidation. No large pleural effusion or pneumothorax. IMPRESSION: Stable support devices.  No new parenchymal opacity. Electronically Signed   By: Yetta Glassman M.D.   On: 05/23/2021 08:28  .   ASSESSMENT: Stable trach wound.  Again do not feel this is the source of the increased white count, which fortunately has improved.  The wound is slow to granulate.  The Pseudomonas is likely contamination from the respiratory tract.  It would be best to have consistent wet-to-dry dressings placed to assist with cleansing the wound.  There was just a dry dressing on the wound when I examined the patient. PLAN: We will order 4 times daily wet to dry dressings to the tracheal wound with the wet gauze dressing soaked in gentamicin irrigation, which should help  reduce Pseudomonas colonization of the wound and facilitate a cleaner wound environment for healing.   [name], MD 05/24/2021 7:39 PM

## 2021-05-24 NOTE — Progress Notes (Signed)
Patient restarted on CRRT at 14:45.

## 2021-05-24 NOTE — Progress Notes (Signed)
[facility]  PROGRESS NOTE   Subjective:   Remains on CRRT. Norepinephrine gtt has been weaned down to 56mg. UF goal of net even.   Afebrile last 24 hours.   Surgery for later today.   Hemoglobin 6.9 - PRBC transfusion last night.   Objective:  Vital signs in last 24 hours:  Temp:  [97.7 F (36.5 C)-99.8 F (37.7 C)] 97.7 F (36.5 C) (02/10 0800) Pulse Rate:  [68-91] 78 (02/10 0830) Resp:  [16-40] 25 (02/10 0830) BP: (94-133)/(46-76) 111/54 (02/10 0830) SpO2:  [96 %-100 %] 98 % (02/10 0830) FiO2 (%):  [28 %-30 %] 28 % (02/10 0830) Weight:  [110.5 kg] 110.5 kg (02/10 0500)  Weight change: -4.6 kg Filed Weights   05/22/21 0500 05/23/21 0500 05/24/21 0500  Weight: 116.6 kg 115.1 kg 110.5 kg    Intake/Output: I/O last 3 completed shifts: In: 4124.5 [I.V.:516.1; Blood:365; Other:120; NG/GT:1395.3; IV Piggyback:1728.1] Out: 31660[Urine:40; Drains:200; OYTKZS:0109 Stool:270]   Intake/Output this shift:  Total I/O In: -  Out: 54[Other:59]  Physical Exam: General:  Critically ill  Head: [place]/AT  Eyes:  Anicteric  Neck:  Trachea collar, tracheostomy  Lungs:   Trach collar, diminished bilaterally  Heart:  regular  Abdomen:   Soft, nontender  Extremities:  No peripheral edema. Left lower extremity wound vac  Neurologic:  Off sedation  Skin:  No lesions  Access: Left IJ temp HD catheter    Basic Metabolic Panel: Recent Labs  Lab 05/19/21 0421 05/19/21 1606 05/20/21 0550 05/20/21 1630 05/21/21 0402 05/22/21 0526 05/22/21 1615 05/23/21 0500 05/23/21 1708 05/24/21 0335 05/24/21 0340  NA  --    < >  --    < > 134* 136 133* 134* 133*  --  133*  K  --    < >  --    < > 4.6 4.3 4.4 3.7 3.9  --  3.6  CL  --    < >  --    < > 101 102 102 100 100  --  101  CO2  --    < >  --    < > '22 25 22 26 25  ' --  24  GLUCOSE  --    < >  --    < > 134* 94 138* 180* 246*  --  189*  BUN  --    < >  --    < > 63* 71* 74* 52* 56*  --  54*  CREATININE  --    < >  --     < > 1.69* 2.34* 2.48* 1.95* 1.84*  --  1.79*  CALCIUM  --    < >  --    < > 9.2 9.2 8.6* 8.9 9.2  --  9.0  MG 2.7*  --  2.5*  --  2.6*  --   --  2.2  --  2.1  --   PHOS  --    < >  --    < > 2.8 3.9 4.0 3.2 3.2  --  3.2   < > = values in this interval not displayed.     CBC: Recent Labs  Lab 05/20/21 0550 05/21/21 0402 05/22/21 0526 05/23/21 0500 05/24/21 0335  WBC 25.0* 22.7* 17.2* 13.6* 13.9*  NEUTROABS 19.4* 17.5* 12.6* 10.3* 12.0*  HGB 7.8* 7.4* 7.8* 7.4* 6.9*  HCT 22.5* 22.5* 24.1* 22.2* 21.7*  MCV 82.4 87.2 87.0 85.1 87.5  PLT 161 166 228 248 268  Urinalysis: No results for input(s): COLORURINE, LABSPEC, PHURINE, GLUCOSEU, HGBUR, BILIRUBINUR, KETONESUR, PROTEINUR, UROBILINOGEN, NITRITE, LEUKOCYTESUR in the last 72 hours.  Invalid input(s): APPERANCEUR    Imaging: DG Chest Port 1 View  Result Date: 05/23/2021 CLINICAL DATA:  Acute respiratory failure EXAM: PORTABLE CHEST 1 VIEW COMPARISON:  Chest x-ray dated May 21, 2021 FINDINGS: Tracheostomy tube, enteric tube, right IJ line, and left IJ line are unchanged in position. Stable cardiac and mediastinal contours. Bibasilar atelectasis. No focal consolidation. No large pleural effusion or pneumothorax. IMPRESSION: Stable support devices.  No new parenchymal opacity. Electronically Signed   By: Yetta Glassman M.D.   On: 05/23/2021 08:28     Medications:    sodium chloride 250 mL (05/24/21 0703)   albumin human 12.5 g (05/24/21 0837)   feeding supplement (PIVOT 1.5 CAL) Stopped (05/23/21 2355)   norepinephrine (LEVOPHED) Adult infusion 6 mcg/min (05/24/21 0700)   piperacillin-tazobactam (ZOSYN)  IV 3.375 g (05/24/21 0840)   prismasol BGK 2/2.5 dialysis solution 1,000 mL/hr at 05/24/21 0847   prismasol BGK 2/2.5 replacement solution 400 mL/hr at 05/23/21 1413   prismasol BGK 2/2.5 replacement solution 400 mL/hr at 05/23/21 1412    sodium chloride   Intravenous Once   amiodarone  200 mg Per Tube Daily    chlorhexidine gluconate (MEDLINE KIT)  15 mL Mouth Rinse BID   Chlorhexidine Gluconate Cloth  6 each Topical Q0600   free water  30 mL Per Tube Q4H   hydrocortisone sod succinate (SOLU-CORTEF) inj  100 mg Intravenous Q12H   insulin aspart  0-9 Units Subcutaneous Q4H   insulin aspart  4 Units Subcutaneous Q4H   insulin detemir  17 Units Subcutaneous BID   magic mouthwash w/lidocaine  10 mL Oral QID   mouth rinse  15 mL Mouth Rinse 10 times per day   midodrine  10 mg Per Tube TID WC   multivitamin  1 tablet Per Tube QHS   nutrition supplement (JUVEN)  1 packet Per Tube BID BM   pantoprazole (PROTONIX) IV  40 mg Intravenous Q24H   povidone-Iodine   Topical Daily   sodium chloride flush  10-40 mL Intracatheter Q12H    Assessment/ Plan:     Principal Problem:   Severe sepsis with septic shock (CODE) ([place]) Active Problems:   COPD (chronic obstructive pulmonary disease) (HCC)   Morbid obesity ([place])   Diabetes mellitus type 2 in obese ([place])   Atrial fibrillation and flutter ([place])   Necrotizing fasciitis ([place])   Acute on chronic respiratory failure ([place])   On mechanically assisted ventilation ([place])   Shock liver   Anemia associated with acute blood loss   Metabolic acidosis   Encounter for continuous renal replacement therapy (CRRT) for acute renal failure (HCC)   Acute encephalopathy   Group A streptococcal infection   Streptococcal toxic shock syndrome ([place])   Status post tracheostomy ([place])   Subdural hemorrhage ([place])   Left leg cellulitis  Alexandra Foster is a 71 y.o. black female with COPD/asthma, diabetes mellitus type II, hypertension, obstructive sleep apnea, paroxysmal atrial fibrillation, GERD, who presents to [facility] on 05/04/2021 for AKI (acute kidney injury) ([place]) [N17.9] Left leg cellulitis [L03.116] Severe sepsis ([place]) [A41.9, R65.20] Severe sepsis with lactic acidosis ([place]) [A41.9, R65.20, E87.20] Found to have necrotizing fascitis requiring multiple debridements  by Dr. Peyton Najjar: 1/24. 1/27, 2/3 and scheduled for 2/10.    #1: Acute kidney injury: AKI is secondary to ischemic nephropathy secondary to hypotension complicated with sepsis. History  of bland urine. Baseline creatinine of 0.98, GFR > 60 on 02/11/21. Anuric urine output.  - Continue continuous renal replacement therapy. Net even ultrafiltration.  - IV albumin   #2: Sepsis: necrotizing fasciitis for left lower extremity status post multiple debridement. Requiring vasopressors: norepinephrine. On stress dose steroids and midodrine Continue zosyn. Appreciate ID input  #3. Anemia with kidney injury: hemoglobin 6.9. EPO given on 2/7. PRBC transfusion on 2/10   #4. Acute Respiratory Failure: weaning off mechanical ventilation. Status post tracheostomy.   Overall prognosis is guarded. Appreciate critical care, infectious disease, ENT, cardiology, and general surgery.     LOS: [place], [facility] 2/10/20239:41 AM

## 2021-05-24 NOTE — Progress Notes (Signed)
Inpatient Diabetes Program Recommendations  AACE/ADA: New Consensus Statement on Inpatient Glycemic Control   Target Ranges:  Prepandial:   less than 140 mg/dL      Peak postprandial:   less than 180 mg/dL (1-2 hours)      Critically ill patients:  140 - 180 mg/dL    Latest Reference Range & Units 05/24/21 03:40  Glucose 70 - 99 mg/dL 189 (H)    Latest Reference Range & Units 05/23/21 07:55 05/23/21 11:22 05/23/21 11:56 05/23/21 15:18 05/23/21 19:16 05/23/21 23:28  Glucose-Capillary 70 - 99 mg/dL 162 (H) 150 (H) 155 (H) 221 (H) 204 (H) 197 (H)   Review of Glycemic Control  Diabetes history: DM2 Outpatient Diabetes medications: Metformin XR 750 mg daily Current orders for Inpatient glycemic control: Levemir 17 units BID, Novolog 4 units Q4H, Novolog 0-9 units Q4H; Solucortef 100 mg Q12H, Pivot @ 65 ml/hr  Inpatient Diabetes Program Recommendations:    Insulin: In reviewing chart, noted Novolog 4 units Q4H for tube feeding coverage has not been given in past 24 hours (charted as not given, TF not at goal). Once tube feeding is at goal, please administer Novolog tube feeding coverage as ordered.  Thanks, Barnie Alderman, RN, MSN, CDE Diabetes Coordinator Inpatient Diabetes Program [phone] (Team Pager from 8am to 5pm)

## 2021-05-24 NOTE — Progress Notes (Signed)
Patient transported to surgery via hospital bed, in stable condition.

## 2021-05-24 NOTE — Anesthesia Postprocedure Evaluation (Signed)
Anesthesia Post Note  Patient: Alexandra Foster  Procedure(s) Performed: IRRIGATION AND DEBRIDEMENT LEFT LEG (Left) Myriad Matrix  APPLICATION APPLICATION OF WOUND VAC  Patient location during evaluation: PACU Anesthesia Type: General Level of consciousness: awake and alert Pain management: pain level controlled Vital Signs Assessment: post-procedure vital signs reviewed and stable Respiratory status: spontaneous breathing, nonlabored ventilation, respiratory function stable and patient connected to nasal cannula oxygen Cardiovascular status: blood pressure returned to baseline and stable Postop Assessment: no apparent nausea or vomiting Anesthetic complications: no   No notable events documented.   Last Vitals:  Vitals:   05/24/21 1425 05/24/21 1430  BP: (!) 116/52 (!) 121/46  Pulse: 61 63  Resp: 16 15  Temp:    SpO2: 100% 100%    Last Pain:  Vitals:   05/24/21 1236  TempSrc: Axillary  PainSc:                  Molli Barrows

## 2021-05-24 NOTE — Progress Notes (Signed)
PT Cancellation Note  Patient Details Name: Alexandra Foster MRN: [id number] DOB: 1950/09/17   Cancelled Treatment:    Reason Eval/Treat Not Completed: Patient at procedure or test/unavailable Pt noted to be off unit for procedure. Will f/u as able & as pt is appropriate for PT. Pt will require either new orders or continue on transfer orders to resume PT after procedure.   Lavone Nian, PT, DPT 05/24/21, 12:24 PM  [name] 05/24/2021, 12:23 PM

## 2021-05-24 NOTE — Consult Note (Signed)
PHARMACY CONSULT NOTE - FOLLOW UP  Pharmacy Consult for Electrolyte Monitoring and Replacement   Recent Labs: Potassium (mmol/L)  Date Value  05/24/2021 3.6  08/11/2013 3.3 (L)   Magnesium (mg/dL)  Date Value  05/24/2021 2.1   Calcium (mg/dL)  Date Value  05/24/2021 9.0   Calcium, Total (mg/dL)  Date Value  08/11/2013 9.2   Albumin (g/dL)  Date Value  05/24/2021 2.3 (L)  08/11/2013 3.1 (L)   Phosphorus (mg/dL)  Date Value  05/24/2021 3.2   Sodium (mmol/L)  Date Value  05/24/2021 133 (L)  08/11/2013 138   Assessment: 71 yo female with a previous medical history of diabetes mellitus type 2, hypertension, obstructive sleep apnea on CPAP, asthma/COPD, GERD, history of COVID-19 pneumonia presenting with lower left extremity pain and chills. Patient found to have severe sepsis and strep pyogenes bacteremia on admission and is currently intubated and sedated. LLL necrotizing fascitis. [facility]  Pharmacy has been consulted to monitor and replace electrolytes.  -CRRT stopped 2/6, re-started 2/8>> -s/p trach 2/3, tolerating trach collar trials  Nutrition:  --Feeds per tube @65ml /hr --Free water per tube 30 mL Q4H  Goal of Therapy:  Electrolytes within normal limits  Plan:  Continues on CRRT; prismasol 2/2.5  --Lytes remain WNL; No replacement at this time  --Renal function panels BID while on CRRT  Lorna Dibble, PharmD, [facility] Clinical Pharmacist 05/24/2021 8:38 AM

## 2021-05-24 NOTE — Op Note (Signed)
ATTENDING Surgeon(s): Herbert Pun, MD   ANESTHESIA: General   PRE-OPERATIVE DIAGNOSIS: Left leg open wound   POST-OPERATIVE DIAGNOSIS: Left leg open wound   PROCEDURE(S):  1.) Debridement of left leg and foot down into tibial and fibular bone, ankles (1540 sq cm) 2.) Implantation of cellular matrix on left leg 3.) Negative pressure dressing DME (1540 sq cm)   INTRAOPERATIVE FINDINGS:  New necrotic tissue extending towards the foot.  Pre operative wound: A 40 x 35 cm left leg wound (1400 sq cm) Post Operative Wound : A 44 x 35 cm left leg wound (1540 sq cm) Implantation of Myriad Matrix sheets:  - 20 x 20 cm (1) - 10 x 20 cm (2) - 7 x 10 cm (1) - 5 x 5 cm (1)  Total coverage with extracellular matrix (895 sq cm)        ESTIMATED BLOOD LOSS: 50 mL    SPECIMENS: None   COMPLICATIONS: None apparent   CONDITION AT END OF PROCEDURE: Critically ill   INDICATIONS FOR PROCEDURE:  Patient is 71 year old female with large complex left leg wound. new ischemic tissue extending into foot and proximal leg. New Extracellular matrix implant needed for incorporation of tissue to aid in the healing process.    DETAILS OF PROCEDURE: After informed consent,patient was taken to the OR. Time out performed. General anesthesia induced. Patient placed on supine position. Previous wound VAC was removed. The left leg  was cleaned and draped in sterile fashion.  With scissors and cautery the wound was debrided and the left proxima foot was also debrided down into ankle bones. The proximal leg was also debrided.  Once adequate muscular, tendon and bone tissue was achieved multiple layers of myriad matrix and Myriad Morcells were implanted and fixated to the tissue with 3-0 Vicryl.   A new negative pressure dressing was applied in layers. Adequate seal was achieved.

## 2021-05-25 LAB — RENAL FUNCTION PANEL
Albumin: 2.3 g/dL — ABNORMAL LOW (ref 3.5–5.0)
Albumin: 2.3 g/dL — ABNORMAL LOW (ref 3.5–5.0)
Anion gap: 14 (ref 5–15)
Anion gap: 9 (ref 5–15)
BUN: 50 mg/dL — ABNORMAL HIGH (ref 8–23)
BUN: 54 mg/dL — ABNORMAL HIGH (ref 8–23)
CO2: 21 mmol/L — ABNORMAL LOW (ref 22–32)
CO2: 23 mmol/L (ref 22–32)
Calcium: 9.3 mg/dL (ref 8.9–10.3)
Calcium: 8.9 mg/dL (ref 8.9–10.3)
Chloride: 100 mmol/L (ref 98–111)
Chloride: 101 mmol/L (ref 98–111)
Creatinine, Ser: 1.56 mg/dL — ABNORMAL HIGH (ref 0.44–1.00)
Creatinine, Ser: 1.6 mg/dL — ABNORMAL HIGH (ref 0.44–1.00)
GFR, Estimated: 35 mL/min — ABNORMAL LOW (ref >60)
GFR, Estimated: 34 mL/min — ABNORMAL LOW (ref >60)
Glucose, Bld: 228 mg/dL — ABNORMAL HIGH (ref 70–99)
Glucose, Bld: 253 mg/dL — ABNORMAL HIGH (ref 70–99)
Phosphorus: 3 mg/dL (ref 2.5–4.6)
Phosphorus: 2.2 mg/dL — ABNORMAL LOW (ref 2.5–4.6)
Potassium: 4.3 mmol/L (ref 3.5–5.1)
Potassium: 3.5 mmol/L (ref 3.5–5.1)
Sodium: 135 mmol/L (ref 135–145)
Sodium: 133 mmol/L — ABNORMAL LOW (ref 135–145)

## 2021-05-25 LAB — GLUCOSE, CAPILLARY
Glucose-Capillary: 180 mg/dL — ABNORMAL HIGH (ref 70–99)
Glucose-Capillary: 190 mg/dL — ABNORMAL HIGH (ref 70–99)
Glucose-Capillary: 192 mg/dL — ABNORMAL HIGH (ref 70–99)
Glucose-Capillary: 220 mg/dL — ABNORMAL HIGH (ref 70–99)
Glucose-Capillary: 221 mg/dL — ABNORMAL HIGH (ref 70–99)
Glucose-Capillary: 230 mg/dL — ABNORMAL HIGH (ref 70–99)
Glucose-Capillary: 251 mg/dL — ABNORMAL HIGH (ref 70–99)

## 2021-05-25 LAB — CBC WITH DIFFERENTIAL/PLATELET
Abs Immature Granulocytes: 0.21 10*3/uL — ABNORMAL HIGH (ref 0.00–0.07)
Basophils Absolute: 0 10*3/uL (ref 0.0–0.1)
Basophils Relative: 0 %
Eosinophils Absolute: 0 10*3/uL (ref 0.0–0.5)
Eosinophils Relative: 0 %
HCT: 24 % — ABNORMAL LOW (ref 36.0–46.0)
Hemoglobin: 7.4 g/dL — ABNORMAL LOW (ref 12.0–15.0)
Immature Granulocytes: 1 %
Lymphocytes Relative: 0 %
Lymphs Abs: 0 10*3/uL — ABNORMAL LOW (ref 0.7–4.0)
MCH: 28.6 pg (ref 26.0–34.0)
MCHC: 30.8 g/dL (ref 30.0–36.0)
MCV: 92.7 fL (ref 80.0–100.0)
Monocytes Absolute: 0.9 10*3/uL (ref 0.1–1.0)
Monocytes Relative: 6 %
Neutro Abs: 13.7 10*3/uL — ABNORMAL HIGH (ref 1.7–7.7)
Neutrophils Relative %: 93 %
Platelets: 292 10*3/uL (ref 150–400)
RBC: 2.59 MIL/uL — ABNORMAL LOW (ref 3.87–5.11)
RDW: 22.9 % — ABNORMAL HIGH (ref 11.5–15.5)
WBC: 14.9 10*3/uL — ABNORMAL HIGH (ref 4.0–10.5)
nRBC: 41.9 % — ABNORMAL HIGH (ref 0.0–0.2)

## 2021-05-25 LAB — HEMOGLOBIN AND HEMATOCRIT, BLOOD
HCT: 24.3 % — ABNORMAL LOW (ref 36.0–46.0)
Hemoglobin: 7.7 g/dL — ABNORMAL LOW (ref 12.0–15.0)

## 2021-05-25 LAB — MAGNESIUM: Magnesium: 2.1 mg/dL (ref 1.7–2.4)

## 2021-05-25 LAB — FUNGITELL, SERUM: Fungitell Result: 138 pg/mL — ABNORMAL HIGH (ref <80)

## 2021-05-25 MED ORDER — INSULIN ASPART 100 UNIT/ML IJ SOLN
0.0000 [IU] | INTRAMUSCULAR | Status: DC
  Administered 2021-05-25 – 2021-05-26 (×4): 4 [IU] via SUBCUTANEOUS
  Administered 2021-05-26 (×2): 7 [IU] via SUBCUTANEOUS
  Administered 2021-05-26: 11 [IU] via SUBCUTANEOUS
  Administered 2021-05-27 (×2): 4 [IU] via SUBCUTANEOUS
  Administered 2021-05-27: 3 [IU] via SUBCUTANEOUS
  Administered 2021-05-27 (×2): 4 [IU] via SUBCUTANEOUS
  Administered 2021-05-27: 11 [IU] via SUBCUTANEOUS
  Administered 2021-05-27: 12:00:00 4 [IU] via SUBCUTANEOUS
  Administered 2021-05-28 – 2021-05-29 (×6): 3 [IU] via SUBCUTANEOUS
  Administered 2021-05-29 – 2021-05-30 (×2): 4 [IU] via SUBCUTANEOUS
  Administered 2021-05-30: 7 [IU] via SUBCUTANEOUS
  Administered 2021-05-30: 4 [IU] via SUBCUTANEOUS
  Administered 2021-05-30: 7 [IU] via SUBCUTANEOUS
  Administered 2021-05-30: 4 [IU] via SUBCUTANEOUS
  Administered 2021-05-30: 7 [IU] via SUBCUTANEOUS
  Administered 2021-05-31: 11 [IU] via SUBCUTANEOUS
  Administered 2021-05-31: 4 [IU] via SUBCUTANEOUS
  Administered 2021-05-31: 7 [IU] via SUBCUTANEOUS
  Administered 2021-05-31: 11 [IU] via SUBCUTANEOUS
  Administered 2021-05-31 (×2): 4 [IU] via SUBCUTANEOUS
  Administered 2021-06-01: 7 [IU] via SUBCUTANEOUS
  Administered 2021-06-01: 3 [IU] via SUBCUTANEOUS
  Administered 2021-06-01: 4 [IU] via SUBCUTANEOUS
  Administered 2021-06-01: 7 [IU] via SUBCUTANEOUS
  Administered 2021-06-01: 3 [IU] via SUBCUTANEOUS
  Administered 2021-06-02: 4 [IU] via SUBCUTANEOUS
  Administered 2021-06-02 (×2): 3 [IU] via SUBCUTANEOUS
  Administered 2021-06-02 (×3): 4 [IU] via SUBCUTANEOUS
  Administered 2021-06-03: 3 [IU] via SUBCUTANEOUS
  Administered 2021-06-03: 4 [IU] via SUBCUTANEOUS
  Administered 2021-06-03: 3 [IU] via SUBCUTANEOUS
  Administered 2021-06-03: 4 [IU] via SUBCUTANEOUS
  Administered 2021-06-03 – 2021-06-04 (×3): 3 [IU] via SUBCUTANEOUS
  Administered 2021-06-04 (×2): 4 [IU] via SUBCUTANEOUS
  Administered 2021-06-04: 3 [IU] via SUBCUTANEOUS
  Administered 2021-06-04: 4 [IU] via SUBCUTANEOUS
  Administered 2021-06-05: 3 [IU] via SUBCUTANEOUS
  Administered 2021-06-05: 4 [IU] via SUBCUTANEOUS
  Administered 2021-06-05: 3 [IU] via SUBCUTANEOUS
  Administered 2021-06-05 (×2): 4 [IU] via SUBCUTANEOUS
  Administered 2021-06-05: 3 [IU] via SUBCUTANEOUS
  Administered 2021-06-06: 4 [IU] via SUBCUTANEOUS
  Administered 2021-06-08: 3 [IU] via SUBCUTANEOUS
  Administered 2021-06-08: 4 [IU] via SUBCUTANEOUS
  Administered 2021-06-09 – 2021-06-10 (×5): 3 [IU] via SUBCUTANEOUS
  Administered 2021-06-10 (×2): 4 [IU] via SUBCUTANEOUS
  Administered 2021-06-10 – 2021-06-11 (×3): 3 [IU] via SUBCUTANEOUS
  Filled 2021-05-25 (×74): qty 1

## 2021-05-25 MED ORDER — MAGIC MOUTHWASH W/LIDOCAINE
10.0000 mL | Freq: Four times a day (QID) | ORAL | Status: DC
  Administered 2021-05-25 – 2021-06-10 (×42): 10 mL via ORAL
  Filled 2021-05-25 (×69): qty 10

## 2021-05-25 MED ORDER — SODIUM CHLORIDE 0.9 % IV BOLUS
500.0000 mL | Freq: Once | INTRAVENOUS | Status: AC
  Administered 2021-05-25: 500 mL via INTRAVENOUS

## 2021-05-25 MED ORDER — SODIUM CHLORIDE 0.9 % IV SOLN
80.0000 mg | Freq: Four times a day (QID) | INTRAVENOUS | Status: DC
  Administered 2021-05-25 – 2021-05-29 (×14): 80 mg
  Filled 2021-05-25: qty 2
  Filled 2021-05-25: qty 80
  Filled 2021-05-25: qty 2
  Filled 2021-05-25: qty 80
  Filled 2021-05-25 (×2): qty 2
  Filled 2021-05-25: qty 80
  Filled 2021-05-25: qty 2
  Filled 2021-05-25: qty 80
  Filled 2021-05-25 (×3): qty 2
  Filled 2021-05-25 (×2): qty 80
  Filled 2021-05-25: qty 2
  Filled 2021-05-25: qty 80

## 2021-05-25 NOTE — Progress Notes (Signed)
While preparing to change dressing around trach, noticed mucous on chest and coming from trach stoma. Patient started desatting. Respiratory responded quickly and inserted new trach.  ETCO2 detector positive for correct placement and O2 sat quickly rebounded. Respiratory therapy requests  that since stoma is so large that they change the dressing when it needs to happen.

## 2021-05-25 NOTE — Progress Notes (Signed)
NAME:  Alexandra Foster, MRN:  [id number], DOB:  [birth year], LOS: 21 ADMISSION DATE:  05/04/2021  History of present Illness:  71 y.o female with medical history significant of Asthma/COPD, T2DM, HTN, OSA on CPAP, GERD, COVID-19 pneumonia, and Bilateral lower extremity edema who presented to the ED with chief complaint of LLE pain and chills since Thursday.  Patient was given 30 cc/kg of fluids and started on broad-spectrum antibiotics for sepsis with septic shock.   Admitted to the ICU with severe sepsis with shock secondary to cellulitis of LLE septic shock with LLL necrotizing fascitis post op resp failure and severe septic shock with progressive renal failure on CRRT and progressive multiorgan failure  MRI/CT SHOWS SMALL SAH  -05/20/21- Patient is improved she is able to follow communication verbally and move extermities. She is on 5L/min Trache collar. We have stopped levophed and working on reducing vasopressin. She remains on CRRT.  Insulin infusion is ongoing and plan to have weight based regimen initiated. Additionally she will have SLP for vocalization and PMV trial as well as ROM training with OT/PT.  Multiple specialists on case appreciate everyone involved.   05/21/21-patient is improved mildly , she's off CRRT, shes off insulin drip. She is mentating.  She was evaluated by [facility] [place].  She was unable to vocalize with PMV.  She may need PEG tube for nourishment.   05/22/21- patient is overnight worsened with fever and increased levophed dose.  She is negative 1.7L.   She is on zosyn.   05/23/21- patient is improved this am. She is giving thumbs up during examination. 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 She seems volume depleted as well so we will start CVP monitoring and give IV LR 1L bolus today to check for volume responsiveness. Albumin also while on CRRT. PT/OT starting today. Type and  screen repeat due to plan for OR in am. On zosyn with ID following. Starting [facility] heparin today.   05/24/21- patient is s/p repeat surgery on RLE. She is on levophed 12mg post surgery. She is not tachycardic and able to follow some verbal communication.    05/25/21- patient stable overnight. Reordering PT/OT today. Wbc count slightly trending up remains on abx probably post surgical.  Remains severely anemic on CRRT and vasopressor support with tracheostomy.   Past Medical History     Arthritis   Asthma   COPD (chronic obstructive pulmonary disease) ([facility]   Diabetes mellitus without complication ([place]   Endometriosis   GERD (gastroesophageal reflux disease)   Hypertension   Obesity   Sleep apnea    CPAP    Significant Hospital Events   05/04/21: Admitted to the ICU with severe sepsis with shock secondary to cellulitis of LLE 1/23 off pressors +Afib with RVR 1/24 blood pressure low  1/24 to OR for fasciotomy 1/25 post op resp failure with severe septic shock 1/26 remains on pressors 1/27 returns to the OR today for debridement, wound VAC 1/28 overnight with hemodynamic deterioration, required reintubation, currently on pressors 1/29 remains on 3 pressors, CRRT, wound VAC and minimal sedation.  Awakens to voice and touch.  Seen by vascular surgery and awaiting input from general surgery regarding amputation of left leg 1/30 remains on vent remains on pressors 1/31 remains on pressors, on vent, on CRRT 2/1 remains on pressors, on CRRT, MRI/CT shows SAH, afib with RVR, ECHO pending, restarted AMIO at 33 2/2 remains on vent  plan for trach tomorrow 2/3 remains on crrt, remains on vent, plan for [facility] and wound vac change in OR 2/3 S/P TRACH,  Implantation of cellular matrix on left leg 2/4 REMAINS ON PRESSORS, REMAINS ON VENT, REMAINS ON CRRT 2/5 REMAINS ON VENT, OFF PRESSORS, REMAINS ON CRRT  Significant Diagnostic Tests:  1/21: Chest Xray> no active cardiopulmonary process 1/21: CT  left lower leg>Diffuse subcutaneous edema may represent cellulitis. No drainable fluid collection/abscess 1/21: Ultrasound lower unilateral left> no DVT   Micro Data:  1/21: SARS-CoV-2 PCR> negative 1/21: Influenza PCR> negative 1/21: Blood culture x2> Streptococcus pyogenes 1/21: Urine Culture> negative 1/21: MRSA PCR>> negative   Antimicrobials:  Vancomycin 1/21> off Cefepime 1/21> off Ceftriaxone 1/21 >> off Penicilli G 1/24>> Linezolid 1/25>>  *Antimicrobials per ID- zosyn now   Objective   Blood pressure (!) 113/55, pulse 98, temperature 98.3 F (36.8 C), temperature source Axillary, resp. rate (!) 24, height _0  (1.651 m), weight 111.8 kg, SpO2 100 %. CVP:  [0 mmHg-14 mmHg] 2 mmHg  Vent Mode: PSV FiO2 (%):  [28 %-30 %] 28 % PEEP:  [5 cmH20] 5 cmH20 Pressure Support:  [5 cmH20] 5 cmH20 Plateau Pressure:  [17 cmH20] 17 cmH20   Intake/Output Summary (Last 24 hours) at 05/25/2021 0900 Last data filed at 05/25/2021 0800 Gross per 24 hour  Intake 1136.12 ml  Output 2024 ml  Net -887.88 ml    Filed Weights   05/24/21 0500 05/24/21 1445 05/25/21 0500  Weight: 110.5 kg 119.7 kg 111.8 kg    REVIEW OF SYSTEMS  PATIENT IS UNABLE TO PROVIDE COMPLETE REVIEW OF SYSTEMS DUE TO SEVERE CRITICAL ILLNESS AND TOXIC METABOLIC ENCEPHALOPATHY    PHYSICAL EXAMINATION:  GENERAL:critically ill appearing, +resp distress EYES: Pupils equal, round, reactive to light.  No scleral icterus.  MOUTH: Moist mucosal membrane. INTUBATED NECK: Supple.  PULMONARY: Mild rhonchi bilaterally  CARDIOVASCULAR: S1 and S2.  No murmurs  GASTROINTESTINAL: Soft, nontender, -distended. Positive bowel sounds.  MUSCULOSKELETAL: No swelling, clubbing, or edema.  NEUROLOGIC: GCS8T MUSCULOSKELETAL: Left lower extremity with wound VAC in place, surgery redressing.  Status post fasciotomy     Assessment & Plan    Admitted to the ICU with severe sepsis with shock secondary to cellulitis of LLE septic  shock with LLL necrotizing fascitis post op resp failure and severe septic shock with progressive renal failure on CRRT and progressive multiorgan failure   Severe ACUTE Hypoxic and Hypercapnic Respiratory Failure       ETIOLOGY IS SEPTIC SHOCK WITH ATELECTASIS, CHF PULMONARY EDEMA AND PLEURAL EFFUSIONS ALL PRESENT ON ADMISSION -continue Mechanical Ventilator support -Wean Fio2 and PEEP as tolerated -VAP/VENT bundle implementation - Wean PEEP & FiO2 as tolerated, maintain SpO2 > 88% - Head of bed elevated 30 degrees, VAP protocol in place - Plateau pressures less than 30 cm H20  - Intermittent chest x-ray & ABG PRN - Ensure adequate pulmonary hygiene  S/P TRACH WEAN TO PS MODE CONSIDER TCT -SLP and PT/OT ordered -05/20/21    SEPTIC shock SOURCE-LLE-present on admission  Refractory Septic Shock secondary to LLE necrotizing fasciitis Streptococcal toxic shock syndrome use vasopressors to keep MAP>65 as needed -continue antibiotics as prescribed-zyvox and rocephin IV - Status postdebridement, wound VAC per surgery -Vascular and general surgery following S/p IVIG   ACUTE KIDNEY INJURY/Renal Failure -continue Foley Catheter-assess need -Avoid nephrotoxic agents -Follow urine output, BMP -Ensure adequate renal perfusion, optimize oxygenation -Renal dose medications CRRT ongoing - GFR is now normal while being dialyzed  Intake/Output  Summary (Last 24 hours) at 05/25/2021 0900 Last data filed at 05/25/2021 0800 Gross per 24 hour  Intake 1136.12 ml  Output 2024 ml  Net -887.88 ml    SHOCK LIVER -imporved     - S/P supportive care for sepsis with improved liver enzymes and resolution of transaminitis     - resolved    Severe electrolyte derrangements     - pharmacy consultation       -improved   Acute encephalopathy  Toxic metabolic encephalopathy Loaded with keppra; continue at 1gram bid-RESOLVED  MRI/CT HEAD SHOWS SMALL VOLUME [place] -Neurology  consulted NO ANTICOAGULATION FOR  1 MONTH AND THEN NEED TO RE-ASSESS NO ANTIPLATELET AGENTS FOR 1 MONTH AND THEN NEED TO RE-ASSESS CAN POSSIBLY CONSIDER BABY ASA BUT WOULD NEED TO PROCEED WITH CAUTION CAN CONSIDER  DVT PROPHYLAXIS AFTER SURGERY BASED ON RISK OF BLEEDING   ENDO - ICU hypoglycemic\Hyperglycemia protocol -check FSBS per protocol   GI GI PROPHYLAXIS as indicated  NUTRITIONAL STATUS DIET-->TF's as tolerated Constipation protocol as indicated   ELECTROLYTES -follow labs as needed -replace as needed -pharmacy consultation and following  ENDO - ICU hypoglycemic\Hyperglycemia protocol -check FSBS per protocol   Best practice:  Diet: NPO Pain/Anxiety/Delirium protocol (if indicated): Yes, initiated VAP protocol (if indicated): Yes, initiated DVT prophylaxis: Subcutaneous Heparin GI prophylaxis: PPI Glucose control: ICU hyperglycemia protocol, insulin drip discontinued; SQ insulin Central venous access:  Rt IJV Arterial line:  N/A Foley:  Yes Mobility:  bed rest  PT consulted: N/A Last date of multidisciplinary goals of care discussion [1/27] Code Status:  full code Disposition: ICU    Labs   CBC: Recent Labs  Lab 05/21/21 0402 05/22/21 0526 05/23/21 0500 05/24/21 0335 05/24/21 1300 05/24/21 1742 05/25/21 0059 05/25/21 0459  WBC 22.7* 17.2* 13.6* 13.9*  --   --   --  14.9*  NEUTROABS 17.5* 12.6* 10.3* 12.0*  --   --   --  13.7*  HGB 7.4* 7.8* 7.4* 6.9* 7.6* 7.4* 7.7* 7.4*  HCT 22.5* 24.1* 22.2* 21.7* 23.9* 23.0* 24.3* 24.0*  MCV 87.2 87.0 85.1 87.5  --   --   --  92.7  PLT 166 228 248 268  --   --   --  292     Basic Metabolic Panel: Recent Labs  Lab 05/20/21 0550 05/20/21 1630 05/21/21 0402 05/22/21 0526 05/23/21 0500 05/23/21 1708 05/24/21 0335 05/24/21 0340 05/24/21 1742 05/25/21 0459  NA  --    < > 134*   < > 134* 133*  --  133* 133* 135  K  --    < > 4.6   < > 3.7 3.9  --  3.6 3.4* 4.3  CL  --    < > 101   < > 100 100  --   101 101 100  CO2  --    < > 22   < > 26 25  --  24 21* 21*  GLUCOSE  --    < > 134*   < > 180* 246*  --  189* 202* 228*  BUN  --    < > 63*   < > 52* 56*  --  54* 55* 50*  CREATININE  --    < > 1.69*   < > 1.95* 1.84*  --  1.79* 1.65* 1.56*  CALCIUM  --    < > 9.2   < > 8.9 9.2  --  9.0 8.8* 9.3  MG 2.5*  --  2.6*  --  2.2  --  2.1  --   --  2.1  PHOS  --    < > 2.8   < > 3.2 3.2  --  3.2 3.3 3.0   < > = values in this interval not displayed.    GFR: Estimated Creatinine Clearance: 40.6 mL/min (A) (by C-G formula based on SCr of 1.56 mg/dL (H)). Recent Labs  Lab 05/22/21 0526 05/23/21 0500 05/24/21 0335 05/25/21 0459  WBC 17.2* 13.6* 13.9* 14.9*     Liver Function Tests: Recent Labs  Lab 05/22/21 1104 05/22/21 1615 05/23/21 0500 05/23/21 1708 05/24/21 0340 05/24/21 1742 05/25/21 0459  AST 102*  --   --   --   --   --   --   ALT 138*  --   --   --   --   --   --   ALKPHOS 323*  --   --   --   --   --   --   BILITOT 2.8*  --   --   --   --   --   --   PROT 6.3*  --   --   --   --   --   --   ALBUMIN 2.4*   < > 2.1* 2.2* 2.3* 2.2* 2.3*   < > = values in this interval not displayed.    Coagulation Profile: Recent Labs  Lab 05/22/21 0526 05/24/21 0335  INR 1.2 1.1     Cardiac Enzymes: No results for input(s): CKTOTAL, CKMB, CKMBINDEX, TROPONINI in the last 168 hours.   HbA1C: Hgb A1c MFr Bld  Date/Time Value Ref Range Status  05/05/2021 04:53 AM 6.7 (H) 4.8 - 5.6 % Final    Comment:    (NOTE)         Prediabetes: 5.7 - 6.4         Diabetes: >6.4         Glycemic control for adults with diabetes: <7.0   02/11/2021 09:41 AM 6.3 (H) <5.7 % of total Hgb Final    Comment:    For someone without known diabetes, a hemoglobin  A1c value between 5.7% and 6.4% is consistent with prediabetes and should be confirmed with a  follow-up test. . For someone with known diabetes, a value <7% indicates that their diabetes is well controlled. A1c targets should be  individualized based on duration of diabetes, age, comorbid conditions, and other considerations. . This assay result is consistent with an increased risk of diabetes. . Currently, no consensus exists regarding use of hemoglobin A1c for diagnosis of diabetes for children. .     CBG: Recent Labs  Lab 05/24/21 1719 05/24/21 1937 05/24/21 2323 05/25/21 0331 05/25/21 0731  GLUCAP 164* 180* 160* 192* 221*    No Known Allergies   Medications  Scheduled Meds:  sodium chloride   Intravenous Once   amiodarone  200 mg Per Tube Daily   chlorhexidine gluconate (MEDLINE KIT)  15 mL Mouth Rinse BID   Chlorhexidine Gluconate Cloth  6 each Topical Q0600   free water  30 mL Per Tube Q4H   hydrocortisone sod succinate (SOLU-CORTEF) inj  100 mg Intravenous Q12H   insulin aspart  0-9 Units Subcutaneous Q4H   insulin aspart  4 Units Subcutaneous Q4H   insulin detemir  17 Units Subcutaneous BID   mouth rinse  15 mL Mouth Rinse 10 times per day   midodrine  10 mg Per Tube  TID WC   multivitamin  1 tablet Per Tube QHS   nutrition supplement (JUVEN)  1 packet Per Tube BID BM   pantoprazole (PROTONIX) IV  40 mg Intravenous Q24H   povidone-Iodine   Topical Daily   sodium chloride flush  10-40 mL Intracatheter Q12H   Continuous Infusions:  sodium chloride 250 mL (05/24/21 0703)   albumin human 12.5 g (05/25/21 0838)   feeding supplement (PIVOT 1.5 CAL) Stopped (05/23/21 2355)   norepinephrine (LEVOPHED) Adult infusion 30 mcg/min (05/25/21 0800)   piperacillin-tazobactam (ZOSYN)  IV 3.375 g (05/25/21 0837)   prismasol BGK 2/2.5 dialysis solution 1,000 mL/hr at 05/24/21 1845   prismasol BGK 2/2.5 replacement solution 400 mL/hr at 05/24/21 2250   prismasol BGK 2/2.5 replacement solution 400 mL/hr at 05/24/21 2250   vasopressin Stopped (05/24/21 2223)   PRN Meds:.acetaminophen, artificial tears, docusate, fentaNYL (SUBLIMAZE) injection, heparin, lip balm, midazolam, ondansetron (ZOFRAN) IV,  oxyCODONE, sennosides, sodium chloride, sodium chloride flush    Critical care provider statement:   Total critical care time: 33 minutes   Performed by: Lanney Gins MD   Critical care time was exclusive of separately billable procedures and treating other patients.   Critical care was necessary to treat or prevent imminent or life-threatening deterioration.   Critical care was time spent personally by me on the following activities: development of treatment plan with patient and/or surrogate as well as nursing, discussions with consultants, evaluation of patient's response to treatment, examination of patient, obtaining history from patient or surrogate, ordering and performing treatments and interventions, ordering and review of laboratory studies, ordering and review of radiographic studies, pulse oximetry and re-evaluation of patient's condition.    Ottie Glazier, M.D.  Pulmonary & Critical Care Medicine

## 2021-05-25 NOTE — Progress Notes (Addendum)
Called to patient's bedside due to vent alarming. RN request assistance with patient's airway. States patient had coughed forcefully and RN was attempting to keep trach in place.  Not able to advance trach back into airway. Stoma full of thick mucous and numerous plugs.  RTs able to insert new #8 flex shiley trach into airway and suction copious amounts of thick pink tinged secretions with plugs without out difficulty. BBS noted after suctioning. End tidal used to verify positive color change. Placed patient back on ventilator. All patient parameter wnl per patient. Vitals stable. Notified provider of situation.  Stoma was cleaned and packed with gentamicin gauze and dry dressing once new trach was placed and verified. RN at bedside to verify placement of gauze.

## 2021-05-25 NOTE — Consult Note (Signed)
PHARMACY CONSULT NOTE - FOLLOW UP  Pharmacy Consult for Electrolyte Monitoring and Replacement   Recent Labs: Potassium (mmol/L)  Date Value  05/25/2021 4.3  08/11/2013 3.3 (L)   Magnesium (mg/dL)  Date Value  05/25/2021 2.1   Calcium (mg/dL)  Date Value  05/25/2021 9.3   Calcium, Total (mg/dL)  Date Value  08/11/2013 9.2   Albumin (g/dL)  Date Value  05/25/2021 2.3 (L)  08/11/2013 3.1 (L)   Phosphorus (mg/dL)  Date Value  05/25/2021 3.0   Sodium (mmol/L)  Date Value  05/25/2021 135  08/11/2013 138   Assessment: 71 yo female with a previous medical history of diabetes mellitus type 2, hypertension, obstructive sleep apnea on CPAP, asthma/COPD, GERD, history of COVID-19 pneumonia presenting with lower left extremity pain and chills. Patient found to have severe sepsis and strep pyogenes bacteremia on admission and is currently intubated and sedated. LLL necrotizing fascitis. [facility]  Pharmacy has been consulted to monitor and replace electrolytes.  -CRRT stopped 2/6, re-started 2/8>> -s/p trach 2/3, tolerating trach collar trials  Potassium: 3.4  Nutrition:  --Feeds per tube @65ml /hr --Free water per tube 30 mL Q4H  Goal of Therapy:  Electrolytes within normal limits  Plan:  Continues on CRRT; prismasol 2/2.5  --No replacement currently needed --Renal function panels BID while on CRRT  Pearla Dubonnet, PharmD Clinical Pharmacist 05/25/2021 7:35 AM

## 2021-05-25 NOTE — Progress Notes (Signed)
Patient blood return from CRRT- filter was soon to clot off.

## 2021-05-25 NOTE — Progress Notes (Signed)
[facility]  PROGRESS NOTE   Subjective:   CRRT. UF goal of net even.   Norepinephrine gtt 18mg/min when examined.   Afebrile last 48 hours.   Debridement of left leg by Dr. CPeyton Najjaryesterday.   Objective:  Vital signs in last 24 hours:  Temp:  [97.8 F (36.6 C)-98.3 F (36.8 C)] 98.3 F (36.8 C) (02/11 0800) Pulse Rate:  [58-109] 93 (02/11 1000) Resp:  [15-28] 23 (02/11 1000) BP: (67-131)/(31-117) 131/58 (02/11 1000) SpO2:  [88 %-100 %] 93 % (02/11 1000) FiO2 (%):  [28 %-50 %] 50 % (02/11 0923) Weight:  [111.8 kg-119.7 kg] 111.8 kg (02/11 0500)  Weight change: 9.2 kg Filed Weights   05/24/21 0500 05/24/21 1445 05/25/21 0500  Weight: 110.5 kg 119.7 kg 111.8 kg    Intake/Output: I/O last 3 completed shifts: In: 2194.9 [I.V.:586.8; Blood:365; NG/GT:655.5; IV Piggyback:587.6] Out: 3299 [Drains:350; Other:2949]   Intake/Output this shift:  Total I/O In: 386.3 [I.V.:78.9; NG/GT:240; IV Piggyback:67.4] Out: 388 [Other:388]  Physical Exam: General:  Critically ill  Head: [place]/AT  Eyes:  Anicteric  Neck:  Trachea collar, tracheostomy  Lungs:   diminished bilaterally  Heart:  regular  Abdomen:   Soft, nontender  Extremities:  Left lower extremity wound vac  Neurologic:  Off sedation, mo  Skin:  No lesions  Access: Left IJ temp HD catheter    Basic Metabolic Panel: Recent Labs  Lab 05/20/21 0550 05/20/21 1630 05/21/21 0402 05/22/21 0526 05/23/21 0500 05/23/21 1708 05/24/21 0335 05/24/21 0340 05/24/21 1742 05/25/21 0459  NA  --    < > 134*   < > 134* 133*  --  133* 133* 135  K  --    < > 4.6   < > 3.7 3.9  --  3.6 3.4* 4.3  CL  --    < > 101   < > 100 100  --  101 101 100  CO2  --    < > 22   < > 26 25  --  24 21* 21*  GLUCOSE  --    < > 134*   < > 180* 246*  --  189* 202* 228*  BUN  --    < > 63*   < > 52* 56*  --  54* 55* 50*  CREATININE  --    < > 1.69*   < > 1.95* 1.84*  --  1.79* 1.65* 1.56*  CALCIUM  --    < > 9.2   < > 8.9 9.2  --   9.0 8.8* 9.3  MG 2.5*  --  2.6*  --  2.2  --  2.1  --   --  2.1  PHOS  --    < > 2.8   < > 3.2 3.2  --  3.2 3.3 3.0   < > = values in this interval not displayed.     CBC: Recent Labs  Lab 05/21/21 0402 05/22/21 0526 05/23/21 0500 05/24/21 0335 05/24/21 1300 05/24/21 1742 05/25/21 0059 05/25/21 0459  WBC 22.7* 17.2* 13.6* 13.9*  --   --   --  14.9*  NEUTROABS 17.5* 12.6* 10.3* 12.0*  --   --   --  13.7*  HGB 7.4* 7.8* 7.4* 6.9* 7.6* 7.4* 7.7* 7.4*  HCT 22.5* 24.1* 22.2* 21.7* 23.9* 23.0* 24.3* 24.0*  MCV 87.2 87.0 85.1 87.5  --   --   --  92.7  PLT 166 228 248 268  --   --   --  292      Urinalysis: No results for input(s): COLORURINE, LABSPEC, PHURINE, GLUCOSEU, HGBUR, BILIRUBINUR, KETONESUR, PROTEINUR, UROBILINOGEN, NITRITE, LEUKOCYTESUR in the last 72 hours.  Invalid input(s): APPERANCEUR    Imaging: No results found.   Medications:    sodium chloride 250 mL (05/24/21 0703)   albumin human Stopped (05/25/21 0936)   feeding supplement (PIVOT 1.5 CAL) Stopped (05/23/21 2355)   norepinephrine (LEVOPHED) Adult infusion 26 mcg/min (05/25/21 1000)   piperacillin-tazobactam (ZOSYN)  IV 12.5 mL/hr at 05/25/21 1000   prismasol BGK 2/2.5 dialysis solution 1,000 mL/hr at 05/25/21 0936   prismasol BGK 2/2.5 replacement solution 400 mL/hr at 05/25/21 1003   prismasol BGK 2/2.5 replacement solution 400 mL/hr at 05/25/21 1003   vasopressin Stopped (05/24/21 2223)    sodium chloride   Intravenous Once   amiodarone  200 mg Per Tube Daily   chlorhexidine gluconate (MEDLINE KIT)  15 mL Mouth Rinse BID   Chlorhexidine Gluconate Cloth  6 each Topical Q0600   free water  30 mL Per Tube Q4H   hydrocortisone sod succinate (SOLU-CORTEF) inj  100 mg Intravenous Q12H   insulin aspart  0-9 Units Subcutaneous Q4H   insulin aspart  4 Units Subcutaneous Q4H   insulin detemir  17 Units Subcutaneous BID   mouth rinse  15 mL Mouth Rinse 10 times per day   midodrine  10 mg Per Tube TID WC    multivitamin  1 tablet Per Tube QHS   nutrition supplement (JUVEN)  1 packet Per Tube BID BM   pantoprazole (PROTONIX) IV  40 mg Intravenous Q24H   povidone-Iodine   Topical Daily   sodium chloride flush  10-40 mL Intracatheter Q12H    Assessment/ Plan:     Principal Problem:   Severe sepsis with septic shock (CODE) ([place]) Active Problems:   COPD (chronic obstructive pulmonary disease) (HCC)   Morbid obesity ([place])   Diabetes mellitus type 2 in obese ([place])   Atrial fibrillation and flutter ([place])   Necrotizing fasciitis ([place])   Acute on chronic respiratory failure ([place])   On mechanically assisted ventilation ([place])   Shock liver   Anemia associated with acute blood loss   Metabolic acidosis   Encounter for continuous renal replacement therapy (CRRT) for acute renal failure (HCC)   Acute encephalopathy   Group A streptococcal infection   Streptococcal toxic shock syndrome ([place])   Status post tracheostomy ([place])   Subdural hemorrhage ([place])   Left leg cellulitis  Alexandra Foster is a 71 y.o. black female with COPD/asthma, diabetes mellitus type II, hypertension, obstructive sleep apnea, paroxysmal atrial fibrillation, GERD, who presents to [facility] on 05/04/2021 for AKI (acute kidney injury) ([place]) [N17.9] Left leg cellulitis [L03.116] Severe sepsis ([place]) [A41.9, R65.20] Severe sepsis with lactic acidosis ([place]) [A41.9, R65.20, E87.20] Found to have necrotizing fascitis requiring multiple debridements by Dr. Peyton Najjar: 1/24. 1/27, 2/3 and 2/10.    #1: Acute kidney injury: AKI is secondary to ischemic nephropathy secondary to hypotension complicated with sepsis. History of bland urine. Baseline creatinine of 0.98, GFR > 60 on 02/11/21. Anuric urine output.  - Continue continuous renal replacement therapy. Net even ultrafiltration.  - IV albumin     #2: Sepsis: necrotizing fasciitis for left lower extremity status post multiple debridement. Requiring vasopressors: norepinephrine. On  stress dose steroids and midodrine Continue zosyn. Appreciate ID input  #3. Anemia with kidney injury: EPO given on 2/7. PRBC transfusion on 2/10   #4. Acute Respiratory Failure: weaned off mechanical  ventilation. Status post tracheostomy.   Overall prognosis is guarded. Appreciate critical care, infectious disease, ENT, cardiology, and general surgery.     LOS: [place], [facility] 2/11/202310:44 AM

## 2021-05-25 NOTE — Evaluation (Signed)
Occupational Therapy re-Evaluation Patient Details Name: Alexandra Foster MRN: [id number] DOB: 24-Oct-1950 Today's Date: 05/25/2021   History of Present Illness 71 y/o female with history of Asthma/COPD, T2DM, HTN, OSA on CPAP, GERD, COVID-19 pneumonia, and Bilateral lower extremity edema who presented to the ED with chief complaint of LLE pain.  Cellulitis of LLE, septic shock with LLL necrotizing fascitis post op resp failure and severe septic shock with progressive renal failure on CRRT and progressive multiorgan failure.  Pt needing vent support on trach at night, HD, tube feeds. Pt is now s/p I&D of LLE & wound vac application on 07/22/79. Per secure chat with nephrologist it is okay to see pt while on CRRT. Surgeon also clears pt for participation with WBAT LLE.   Clinical Impression   Pt seen for OT re-evaluation this date s/p debridement on 2/10. She is currently on CRRT, but light session is approved by attending, nephrologist and surgeon. Both primary and orienting RN are present in room for entirety of session. Pt with significant global weakness of trunk and extremities. Understandable, very limited tolerance for any/all movement of L LE. In addition, L UE noted to be weaker and significantly more edematous than R UE. OT engages pt in oral hygiene bed level with HOB elevated 30 degrees with MOD/MAX A hand over hand-essentially MAX A as pt is resistive to any/all hygiene task participation. OT/PT co-tx completed to maximize services in setting of several significant medical complications. She is able, however to functionally move R UE and participate. OT also encourages repositioning of cervical region and engages pt in gentle AAROM of cervical spine to turn head more towards the right to achieve a neutral position. PT assisted with adjusting pillows to allow pt to hold this more neutral position. Pt left with all needs met and in reach. Will continue to follow acutely. At this time, therapy is  very limited, but will continue to attempt to progress as prudent/appropriate.   Note: spO2 86-92% on the trach/vent throughout session.      Recommendations for follow up therapy are one component of a multi-disciplinary discharge planning process, led by the attending physician.  Recommendations may be updated based on patient status, additional functional criteria and insurance authorization.   Follow Up Recommendations  OT at Long-term acute care hospital    Assistance Recommended at Discharge Frequent or constant Supervision/Assistance  Patient can return home with the following Two people to help with bathing/dressing/bathroom;Two people to help with walking and/or transfers;Assistance with cooking/housework;Assist for transportation;Help with stairs or ramp for entrance;Assistance with feeding;Direct supervision/assist for financial management;Direct supervision/assist for medications management    Functional Status Assessment  Patient has had a recent decline in their functional status and demonstrates the ability to make significant improvements in function in a reasonable and predictable amount of time.  Equipment Recommendations  Other (comment)    Recommendations for Other Services       Precautions / Restrictions Precautions Precautions: Fall Precaution Comments: L jugular temp dialysis catheter, wounds under most skin folds, LLE wound vac, ventilator via tracheostomy, rectal tube, catheter Restrictions Weight Bearing Restrictions: Yes LLE Weight Bearing: Weight bearing as tolerated      Mobility Bed Mobility               General bed mobility comments: okay'ed by attending, surgeon, and nephrologist, but deferred d/t signficant weakness and extensive line mgt needs while on CRRT    Transfers  Balance                                           ADL either performed or assessed with clinical judgement    ADL Overall ADL's : Needs assistance/impaired   Eating/Feeding Details (indicate cue type and reason): NA, feeding tube Grooming: Wash/dry face;Oral care;Moderate assistance;Maximal assistance;Bed level Grooming Details (indicate cue type and reason): HOB at 30 degrees, requires MOD/MAX A hand over hand with use of more functional/stronger R hand to perform oral care and wash face, significant sloughing to buccal area noted. Pt very resistant to oral hygiene tasks.                               General ADL Comments: Essentially MAX A for UB ADLs bed level and TOTAL A for LB ADLs, MAX A +2 for any level of repositioning/bed mobility. Extensive line mgt assistance required.     Vision Patient Visual Report: No change from baseline       Perception     Praxis      Pertinent Vitals/Pain Pain Assessment Pain Assessment: Faces Faces Pain Scale: Hurts whole lot Pain Location: LLE when PT attempts to dorsiflex ankle Pain Descriptors / Indicators: Grimacing, Discomfort Pain Intervention(s): Limited activity within patient's tolerance, Monitored during session, Repositioned     Hand Dominance Left   Extremity/Trunk Assessment Upper Extremity Assessment Upper Extremity Assessment: RUE deficits/detail;LUE deficits/detail RUE Deficits / Details: weak grasp strength of ~3+/5, able to minimally grasp ADL items like oral swab. fxl elbow flex/ext. Shld flexion PROM to 3/4 range, AAROM to 1/2 range. LUE Deficits / Details: significantly weaker and more edematous. Digit flex/ext strength 3/5. Elbow PROM to full range, AAROM to 1/4 range. Shld flexion with just trace strength 1/5. Tolerates PROM to 3/4 range   Lower Extremity Assessment Lower Extremity Assessment: Defer to PT evaluation;Generalized weakness (essentially no tolerance for DF/PF on L side, tolerates gentle PROM internal/external rotation) RLE Deficits / Details: WNL ankle dorsiflexion PROM but PT unable to flex knee  despite multiple attempts LLE Deficits / Details: Pt with behaviors demonstrating significant pain when PT attempts to dorsiflex ankle but appears to tolerate AAROM/PROM for hip internal rotation in supine position   Cervical / Trunk Assessment Cervical / Trunk Assessment:  (pt lying with head rotated to R, requires PROM to achieve neutral)   Communication Communication Communication: Tracheostomy   Cognition Arousal/Alertness: Lethargic Behavior During Therapy: Flat affect Overall Cognitive Status: Difficult to assess                                 General Comments: Pt with eyes open, attempts to use thumb up/down to communicate, resistive to grooming tasks, resistive to moving lips to communicate     General Comments       Exercises Other Exercises Other Exercises: OT engages pt in bed level oral care   Shoulder Instructions      Home Living Family/patient expects to be discharged to:: Inpatient rehab Living Arrangements: Spouse/significant other                                      Prior Functioning/Environment Prior Level of Function :  (  information gathered from prior documentation)             Mobility Comments: Pt was hosptialized with Covid ~2 years ago and was able to easily ambulate >200 ft w/o AD, d/c'd from PT w/o further needs.  Appears she was relatively recently independent          OT Problem List: Decreased strength;Pain;Decreased range of motion;Decreased cognition;Obesity;Impaired balance (sitting and/or standing);Decreased activity tolerance;Impaired UE functional use      OT Treatment/Interventions: Self-care/ADL training;Therapeutic exercise;Therapeutic activities;DME and/or AE instruction;Patient/family education;Balance training    OT Goals(Current goals can be found in the care plan section) Acute Rehab OT Goals Patient Stated Goal: none stated OT Goal Formulation: Patient unable to participate in goal  setting Time For Goal Achievement: 06/08/21 Potential to Achieve Goals: Fair  OT Frequency: Min 2X/week    Co-evaluation PT/OT/SLP Co-Evaluation/Treatment: Yes Reason for Co-Treatment: Complexity of the patient's impairments (multi-system involvement);Necessary to address cognition/behavior during functional activity;For patient/therapist safety PT goals addressed during session: Mobility/safety with mobility OT goals addressed during session: ADL's and self-care      AM-PAC OT "6 Clicks" Daily Activity     Outcome Measure Help from another person eating meals?: A Lot Help from another person taking care of personal grooming?: A Lot Help from another person toileting, which includes using toliet, bedpan, or urinal?: Total Help from another person bathing (including washing, rinsing, drying)?: Total Help from another person to put on and taking off regular upper body clothing?: Total Help from another person to put on and taking off regular lower body clothing?: Total 6 Click Score: 8   End of Session Equipment Utilized During Treatment: Oxygen (vent/trach.) Nurse Communication: Other (comment) (oral care concerns)  Activity Tolerance: Treatment limited secondary to medical complications (Comment) (extensive line mgt needs) Patient left: in bed;with call bell/phone within reach;with bed alarm set;with nursing/sitter in room  OT Visit Diagnosis: Other abnormalities of gait and mobility (R26.89);Muscle weakness (generalized) (M62.81);Pain Pain - Right/Left: Left Pain - part of body: Leg                Time: 1252-7129 OT Time Calculation (min): 16 min Charges:  OT General Charges $OT Visit: 1 Visit OT Evaluation $OT Re-eval: 1 Re-eval  Gerrianne Scale, MS, OTR/L ascom [phone] 05/25/21, 3:21 PM

## 2021-05-25 NOTE — Progress Notes (Signed)
CRRT restarted at 1100.

## 2021-05-25 NOTE — Evaluation (Addendum)
Physical Therapy Evaluation Patient Details Name: Alexandra Foster MRN: [id number] DOB: [birth year] Today's Date: 05/25/2021  History of Present Illness  71 y/o female with history of Asthma/COPD, T2DM, HTN, OSA on CPAP, GERD, COVID-19 pneumonia, and Bilateral lower extremity edema who presented to the ED with chief complaint of LLE pain.  Cellulitis of LLE, septic shock with LLL necrotizing fascitis post op resp failure and severe septic shock with progressive renal failure on CRRT and progressive multiorgan failure.  Pt needing vent support on trach at night, HD, tube feeds. Pt is now s/p I&D of LLE & wound vac application on 7/84/69. Per secure chat with nephrologist it is okay to see pt while on CRRT. Surgeon also clears pt for participation with WBAT LLE.  Clinical Impression  Pt seen for PT re-evaluation with co-tx with OT. Pt cleared for participation via secure chat (cleared by nephrologist, attending & surgeon). Pt requires max encouragement/cuing to participate in basic movements in bed. Pt initially very resistant to performing even grooming tasks. Pt demonstrates WNL R ankle ROM but PT unable to flex R knee despite multiple attempts. Pt grimaces in significant pain when PT attempts to dorsiflex L ankle but tolerates hip internal rotation. PT encourages pt to participate in even minimal bed exercises to prevent further deconditioning while in acute hospital.     SpO2 86-90% during session Nurse present in room for session.     Recommendations for follow up therapy are one component of a multi-disciplinary discharge planning process, led by the attending physician.  Recommendations may be updated based on patient status, additional functional criteria and insurance authorization.  Follow Up Recommendations PT at Long-term acute care hospital    Assistance Recommended at Discharge Frequent or constant Supervision/Assistance  Patient can return home with the following   (dependent care  for all tasks)    Equipment Recommendations None recommended by PT  Recommendations for Other Services       Functional Status Assessment Patient has had a recent decline in their functional status and/or demonstrates limited ability to make significant improvements in function in a reasonable and predictable amount of time     Precautions / Restrictions Precautions Precautions: Fall Precaution Comments: L jugular temp dialysis catheter, wounds under most skin folds, LLE wound vac, ventilator via tracheostomy, rectal tube, catheter Restrictions Weight Bearing Restrictions: Yes LLE Weight Bearing: Weight bearing as tolerated      Mobility  Bed Mobility                    Transfers                        Ambulation/Gait                  Stairs            Wheelchair Mobility    Modified Rankin (Stroke Patients Only)       Balance                                             Pertinent Vitals/Pain Pain Assessment Pain Assessment: Faces Faces Pain Scale: Hurts whole lot Pain Location: LLE when PT attempts to dorsiflex ankle Pain Descriptors / Indicators: Grimacing, Discomfort Pain Intervention(s): Monitored during session, Repositioned, Limited activity within patient's tolerance    Home Living Family/patient expects to be  discharged to:: Inpatient rehab Living Arrangements: Spouse/significant other                      Prior Function               Mobility Comments: Pt was hosptialized with Covid ~2 years ago and was able to easily ambulate >200 ft w/o AD, d/c'd from PT w/o further needs.  Appears she was relatively recently independent       Hand Dominance        Extremity/Trunk Assessment   Upper Extremity Assessment Upper Extremity Assessment: Defer to OT evaluation (able to flex digits on L hand & R, difficulty holding oral swab with R hand, some active movement noted in LUE when attempting  to flex shoulder/elbow)    Lower Extremity Assessment Lower Extremity Assessment: LLE deficits/detail;RLE deficits/detail RLE Deficits / Details: WNL ankle dorsiflexion PROM but PT unable to flex knee despite multiple attempts LLE Deficits / Details: Pt with behaviors demonstrating significant pain when PT attempts to dorsiflex ankle but appears to tolerate AAROM/PROM for hip internal rotation in supine position    Cervical / Trunk Assessment Cervical / Trunk Assessment:  (pt lying with head rotated to R, requires PROM to achieve neutral)  Communication   Communication: Tracheostomy  Cognition Arousal/Alertness: Lethargic Behavior During Therapy: Flat affect Overall Cognitive Status: Difficult to assess                                 General Comments: Pt with eyes open, attempts to use thumb up/down to communicate, resistive to grooming tasks        General Comments      Exercises     Assessment/Plan    PT Assessment Patient needs continued PT services  PT Problem List Decreased strength;Decreased range of motion;Decreased activity tolerance;Decreased balance;Decreased mobility;Decreased coordination;Decreased cognition;Decreased knowledge of use of DME;Decreased safety awareness;Pain;Decreased knowledge of precautions;Cardiopulmonary status limiting activity;Decreased skin integrity;Obesity       PT Treatment Interventions Gait training;DME instruction;Functional mobility training;Therapeutic activities;Therapeutic exercise;Balance training;Cognitive remediation;Neuromuscular re-education;Patient/family education;Modalities;Manual techniques;Wheelchair mobility training    PT Goals (Current goals can be found in the Care Plan section)  Acute Rehab PT Goals Patient Stated Goal: none stated PT Goal Formulation: Patient unable to participate in goal setting Time For Goal Achievement: 06/08/21 Potential to Achieve Goals: Poor    Frequency Min 2X/week      Co-evaluation PT/OT/SLP Co-Evaluation/Treatment: Yes Reason for Co-Treatment: Complexity of the patient's impairments (multi-system involvement);Necessary to address cognition/behavior during functional activity;For patient/therapist safety PT goals addressed during session: Mobility/safety with mobility;Strengthening/ROM         AM-PAC PT "6 Clicks" Mobility  Outcome Measure Help needed turning from your back to your side while in a flat bed without using bedrails?: Total Help needed moving from lying on your back to sitting on the side of a flat bed without using bedrails?: Total Help needed moving to and from a bed to a chair (including a wheelchair)?: Total Help needed standing up from a chair using your arms (e.g., wheelchair or bedside chair)?: Total Help needed to walk in hospital room?: Total Help needed climbing 3-5 steps with a railing? : Total 6 Click Score: 6    End of Session Equipment Utilized During Treatment: Oxygen (vent via trach) Activity Tolerance: Patient limited by lethargy;Patient limited by pain Patient left: in bed;with nursing/sitter in room Nurse Communication: Mobility status PT Visit Diagnosis:  Muscle weakness (generalized) (M62.81);Adult, failure to thrive (R62.7)    Time: 6580-0634 PT Time Calculation (min) (ACUTE ONLY): 13 min   Charges:   PT Evaluation $PT Re-evaluation: 1 Re-eval          Lavone Nian, PT, DPT 05/25/21, 1:29 PM   [name] 05/25/2021, 1:21 PM

## 2021-05-25 NOTE — Progress Notes (Signed)
On CRRT entire shift, had to increase levophed to 25 mcg's, attempted Vasopressin, but it did not appear to have any great effect on blood pressure, so I titrated it off, but have more available if needed. Refusing mouth care all night, attempted to educate patient on necessity for mouth care, she shook her head and swung her hand at the swab.

## 2021-05-25 NOTE — Plan of Care (Signed)
Continuing with plan of care. 

## 2021-05-26 LAB — RENAL FUNCTION PANEL
Albumin: 2.2 g/dL — ABNORMAL LOW (ref 3.5–5.0)
Albumin: 2.4 g/dL — ABNORMAL LOW (ref 3.5–5.0)
Anion gap: 9 (ref 5–15)
Anion gap: 8 (ref 5–15)
BUN: 51 mg/dL — ABNORMAL HIGH (ref 8–23)
BUN: 48 mg/dL — ABNORMAL HIGH (ref 8–23)
CO2: 25 mmol/L (ref 22–32)
CO2: 25 mmol/L (ref 22–32)
Calcium: 9.2 mg/dL (ref 8.9–10.3)
Calcium: 9.2 mg/dL (ref 8.9–10.3)
Chloride: 98 mmol/L (ref 98–111)
Chloride: 99 mmol/L (ref 98–111)
Creatinine, Ser: 1.47 mg/dL — ABNORMAL HIGH (ref 0.44–1.00)
Creatinine, Ser: 1.32 mg/dL — ABNORMAL HIGH (ref 0.44–1.00)
GFR, Estimated: 38 mL/min — ABNORMAL LOW (ref >60)
GFR, Estimated: 43 mL/min — ABNORMAL LOW (ref >60)
Glucose, Bld: 214 mg/dL — ABNORMAL HIGH (ref 70–99)
Glucose, Bld: 253 mg/dL — ABNORMAL HIGH (ref 70–99)
Phosphorus: 2.2 mg/dL — ABNORMAL LOW (ref 2.5–4.6)
Phosphorus: 2.4 mg/dL — ABNORMAL LOW (ref 2.5–4.6)
Potassium: 3.6 mmol/L (ref 3.5–5.1)
Potassium: 3.6 mmol/L (ref 3.5–5.1)
Sodium: 132 mmol/L — ABNORMAL LOW (ref 135–145)
Sodium: 132 mmol/L — ABNORMAL LOW (ref 135–145)

## 2021-05-26 LAB — CBC WITH DIFFERENTIAL/PLATELET
Abs Immature Granulocytes: 0.31 10*3/uL — ABNORMAL HIGH (ref 0.00–0.07)
Basophils Absolute: 0.1 10*3/uL (ref 0.0–0.1)
Basophils Relative: 0 %
Eosinophils Absolute: 0 10*3/uL (ref 0.0–0.5)
Eosinophils Relative: 0 %
HCT: 19.6 % — ABNORMAL LOW (ref 36.0–46.0)
Hemoglobin: 5.9 g/dL — ABNORMAL LOW (ref 12.0–15.0)
Immature Granulocytes: 2 %
Lymphocytes Relative: 0 %
Lymphs Abs: 0 10*3/uL — ABNORMAL LOW (ref 0.7–4.0)
MCH: 28.5 pg (ref 26.0–34.0)
MCHC: 30.1 g/dL (ref 30.0–36.0)
MCV: 94.7 fL (ref 80.0–100.0)
Monocytes Absolute: 1.4 10*3/uL — ABNORMAL HIGH (ref 0.1–1.0)
Monocytes Relative: 7 %
Neutro Abs: 19.1 10*3/uL — ABNORMAL HIGH (ref 1.7–7.7)
Neutrophils Relative %: 91 %
Platelets: 221 10*3/uL (ref 150–400)
RBC: 2.07 MIL/uL — ABNORMAL LOW (ref 3.87–5.11)
RDW: 22.9 % — ABNORMAL HIGH (ref 11.5–15.5)
Smear Review: NORMAL
WBC: 20.8 10*3/uL — ABNORMAL HIGH (ref 4.0–10.5)
nRBC: 20.7 % — ABNORMAL HIGH (ref 0.0–0.2)

## 2021-05-26 LAB — PREPARE RBC (CROSSMATCH)

## 2021-05-26 LAB — GLUCOSE, CAPILLARY
Glucose-Capillary: 219 mg/dL — ABNORMAL HIGH (ref 70–99)
Glucose-Capillary: 195 mg/dL — ABNORMAL HIGH (ref 70–99)
Glucose-Capillary: 195 mg/dL — ABNORMAL HIGH (ref 70–99)
Glucose-Capillary: 261 mg/dL — ABNORMAL HIGH (ref 70–99)
Glucose-Capillary: 214 mg/dL — ABNORMAL HIGH (ref 70–99)

## 2021-05-26 LAB — MAGNESIUM: Magnesium: 1.9 mg/dL (ref 1.7–2.4)

## 2021-05-26 LAB — HEMOGLOBIN AND HEMATOCRIT, BLOOD
HCT: 25.8 % — ABNORMAL LOW (ref 36.0–46.0)
Hemoglobin: 8 g/dL — ABNORMAL LOW (ref 12.0–15.0)

## 2021-05-26 MED ORDER — SODIUM CHLORIDE 0.9% IV SOLUTION: Freq: Once | INTRAVENOUS | Status: DC

## 2021-05-26 MED ORDER — SODIUM CHLORIDE 0.9% IV SOLUTION: Freq: Once | INTRAVENOUS | Status: AC

## 2021-05-26 MED ORDER — MIDAZOLAM HCL 2 MG/2ML IJ SOLN
1.0000 mg | INTRAMUSCULAR | Status: DC | PRN
  Administered 2021-05-26 – 2021-05-30 (×14): 2 mg via INTRAVENOUS
  Filled 2021-05-26 (×13): qty 2

## 2021-05-26 NOTE — Progress Notes (Signed)
Ch encountered family in waiting room, offered services. Family asked for prayer. Ch sat with family and prayed.

## 2021-05-26 NOTE — Progress Notes (Signed)
[facility]  PROGRESS NOTE   Subjective:   CRRT. UF goal of net even.   Norepinephrine gtt weaned to 66mg/min when examined  Afebrile    PRBC transfusion scheduled for today.   Objective:  Vital signs in last 24 hours:  Temp:  [98.2 F (36.8 C)-99.3 F (37.4 C)] 99 F (37.2 C) (02/12 0937) Pulse Rate:  [77-103] 82 (02/12 1000) Resp:  [16-32] 16 (02/12 1000) BP: (101-130)/(43-75) 113/55 (02/12 1000) SpO2:  [90 %-98 %] 96 % (02/12 1000) FiO2 (%):  [40 %-50 %] 40 % (02/12 1000)  Weight change:  Filed Weights   05/24/21 0500 05/24/21 1445 05/25/21 0500  Weight: 110.5 kg 119.7 kg 111.8 kg    Intake/Output: I/O last 3 completed shifts: In: 2559.9 [I.V.:700.9; Other:30; NG/GT:940; IV Piggyback:889] Out: 47017[Other:4309; Stool:25]   Intake/Output this shift:  Total I/O In: 607.8 [I.V.:22.5; Blood:290; NG/GT:267.3; IV Piggyback:28] Out: 290 [Other:290]  Physical Exam: General:  Critically ill  Head: /AT  Eyes:  Anicteric  Neck:  Trachea collar, tracheostomy  Lungs:   diminished bilaterally, Pressure Support FIO2 40%  Heart:  regular  Abdomen:   Soft, nontender  Extremities:  Left lower extremity wound vac  Neurologic:  On sedation and opioids   Skin:  No lesions  Access: Left IJ temp HD catheter    Basic Metabolic Panel: Recent Labs  Lab 05/21/21 0402 05/22/21 0526 05/23/21 0500 05/23/21 1708 05/24/21 0335 05/24/21 0340 05/24/21 1742 05/25/21 0459 05/25/21 1600 05/26/21 0514  NA 134*   < > 134*   < >  --  133* 133* 135 133* 132*  K 4.6   < > 3.7   < >  --  3.6 3.4* 4.3 3.5 3.6  CL 101   < > 100   < >  --  101 101 100 101 98  CO2 22   < > 26   < >  --  24 21* 21* 23 25  GLUCOSE 134*   < > 180*   < >  --  189* 202* 228* 253* 214*  BUN 63*   < > 52*   < >  --  54* 55* 50* 54* 51*  CREATININE 1.69*   < > 1.95*   < >  --  1.79* 1.65* 1.56* 1.60* 1.47*  CALCIUM 9.2   < > 8.9   < >  --  9.0 8.8* 9.3 8.9 9.2  MG 2.6*  --  2.2  --  2.1  --    --  2.1  --  1.9  PHOS 2.8   < > 3.2   < >  --  3.2 3.3 3.0 2.2* 2.2*   < > = values in this interval not displayed.     CBC: Recent Labs  Lab 05/22/21 0526 05/23/21 0500 05/24/21 0335 05/24/21 1300 05/24/21 1742 05/25/21 0059 05/25/21 0459 05/26/21 0514  WBC 17.2* 13.6* 13.9*  --   --   --  14.9* 20.8*  NEUTROABS 12.6* 10.3* 12.0*  --   --   --  13.7* 19.1*  HGB 7.8* 7.4* 6.9* 7.6* 7.4* 7.7* 7.4* 5.9*  HCT 24.1* 22.2* 21.7* 23.9* 23.0* 24.3* 24.0* 19.6*  MCV 87.0 85.1 87.5  --   --   --  92.7 94.7  PLT 228 248 268  --   --   --  292 221      Urinalysis: No results for input(s): COLORURINE, LABSPEC, PHURINE, GLUCOSEU, HGBUR, BILIRUBINUR, KETONESUR, PROTEINUR, UROBILINOGEN,  NITRITE, LEUKOCYTESUR in the last 72 hours.  Invalid input(s): APPERANCEUR    Imaging: No results found.   Medications:    sodium chloride 250 mL (05/24/21 0703)   albumin human 60 mL/hr at 05/26/21 1000   feeding supplement (PIVOT 1.5 CAL) 1,000 mL (05/26/21 0832)   norepinephrine (LEVOPHED) Adult infusion 8 mcg/min (05/26/21 1000)   piperacillin-tazobactam (ZOSYN)  IV 3.375 g (05/26/21 1029)   prismasol BGK 2/2.5 dialysis solution 1,000 mL/hr at 05/26/21 1049   prismasol BGK 2/2.5 replacement solution 400 mL/hr at 05/26/21 1049   prismasol BGK 2/2.5 replacement solution 400 mL/hr at 05/26/21 1048   vasopressin Stopped (05/24/21 2223)    sodium chloride   Intravenous Once   sodium chloride   Intravenous Once   amiodarone  200 mg Per Tube Daily   chlorhexidine gluconate (MEDLINE KIT)  15 mL Mouth Rinse BID   Chlorhexidine Gluconate Cloth  6 each Topical Q0600   free water  30 mL Per Tube Q4H   gentamicin irrigation  80 mg Irrigation QID   hydrocortisone sod succinate (SOLU-CORTEF) inj  100 mg Intravenous Q12H   insulin aspart  0-20 Units Subcutaneous Q4H   insulin aspart  4 Units Subcutaneous Q4H   insulin detemir  17 Units Subcutaneous BID   magic mouthwash w/lidocaine  10 mL Oral QID    mouth rinse  15 mL Mouth Rinse 10 times per day   midodrine  10 mg Per Tube TID WC   multivitamin  1 tablet Per Tube QHS   nutrition supplement (JUVEN)  1 packet Per Tube BID BM   pantoprazole (PROTONIX) IV  40 mg Intravenous Q24H   povidone-Iodine   Topical Daily   sodium chloride flush  10-40 mL Intracatheter Q12H    Assessment/ Plan:     Principal Problem:   Severe sepsis with septic shock (CODE) ([place]) Active Problems:   COPD (chronic obstructive pulmonary disease) (HCC)   Morbid obesity ([place])   Diabetes mellitus type 2 in obese ([place])   Atrial fibrillation and flutter ([place])   Necrotizing fasciitis ([place])   Acute on chronic respiratory failure ([place])   On mechanically assisted ventilation (HCC)   Shock liver   Anemia associated with acute blood loss   Metabolic acidosis   Encounter for continuous renal replacement therapy (CRRT) for acute renal failure (HCC)   Acute encephalopathy   Group A streptococcal infection   Streptococcal toxic shock syndrome ([place])   Status post tracheostomy ([place])   Subdural hemorrhage ([place])   Left leg cellulitis  Ms. Norissa Bartee Pfeifer is a 71 y.o. black female with COPD/asthma, diabetes mellitus type II, hypertension, obstructive sleep apnea, paroxysmal atrial fibrillation, GERD, who presents to [facility] on 05/04/2021 for AKI (acute kidney injury) ([place]) [N17.9] Left leg cellulitis [L03.116] Severe sepsis ([place]) [A41.9, R65.20] Severe sepsis with lactic acidosis ([place]) [A41.9, R65.20, E87.20] Found to have necrotizing fascitis requiring multiple debridements by Dr. Peyton Najjar: 1/24. 1/27, 2/3 and 2/10.    #1: Acute kidney injury: AKI is secondary to ischemic nephropathy secondary to hypotension complicated with sepsis. History of bland urine. Baseline creatinine of 0.98, GFR > 60 on 02/11/21. Anuric urine output.  - Continue continuous renal replacement therapy. Net even ultrafiltration.  - IV albumin     #2: Sepsis and hypotension: necrotizing fasciitis for  left lower extremity status post multiple debridement. Continues to lose volume from wound vac. Requiring vasopressors: norepinephrine. On stress dose steroids and midodrine - Continue zosyn. - Appreciate ID input  #3. Anemia  with kidney injury: hemoglobin 5.9. EPO given on 2/7. PRBC transfusion on 2/10.   - PRBC transfusion for today  #4. Acute Respiratory Failure:requiring mechanical ventilation. Status post tracheostomy  Overall prognosis is guarded. Patient remains critically ill. Appreciate critical care, infectious disease, ENT, cardiology, and general surgery.     LOS: [place], [facility] 2/12/202310:49 AM

## 2021-05-26 NOTE — Progress Notes (Signed)
NAME:  Alexandra Foster, MRN:  [id number], DOB:  [birth year], LOS: 44 ADMISSION DATE:  05/04/2021  History of present Illness:  71 y.o female with medical history significant of Asthma/COPD, T2DM, HTN, OSA on CPAP, GERD, COVID-19 pneumonia, and Bilateral lower extremity edema who presented to the ED with chief complaint of LLE pain and chills since Thursday.  Patient was given 30 cc/kg of fluids and started on broad-spectrum antibiotics for sepsis with septic shock.   Admitted to the ICU with severe sepsis with shock secondary to cellulitis of LLE septic shock with LLL necrotizing fascitis post op resp failure and severe septic shock with progressive renal failure on CRRT and progressive multiorgan failure  MRI/CT SHOWS SMALL SAH  -05/20/21- Patient is improved she is able to follow communication verbally and move extermities. She is on 5L/min Trache collar. We have stopped levophed and working on reducing vasopressin. She remains on CRRT.  Insulin infusion is ongoing and plan to have weight based regimen initiated. Additionally she will have SLP for vocalization and PMV trial as well as ROM training with OT/PT.  Multiple specialists on case appreciate everyone involved.   05/21/21-patient is improved mildly , she's off CRRT, shes off insulin drip. She is mentating.  She was evaluated by [facility]  She was unable to vocalize with PMV.  She may need PEG tube for nourishment.   05/22/21- patient is overnight worsened with fever and increased levophed dose.  She is negative 1.7L.   She is on zosyn.   05/23/21- patient is improved this am. She is giving thumbs up during examination. 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 She seems volume depleted as well so we will start CVP monitoring and give IV LR 1L bolus today to check for volume responsiveness. Albumin also while on CRRT. PT/OT starting today. Type and  screen repeat due to plan for OR in am. On zosyn with ID following. Starting [place] heparin today.   05/24/21- patient is s/p repeat surgery on RLE. She is on levophed 37mg post surgery. She is not tachycardic and able to follow some verbal communication.    05/25/21- patient stable overnight. Reordering PT/OT today. Wbc count slightly trending up remains on abx probably post surgical.  Remains severely anemic on CRRT and vasopressor support with tracheostomy.   05/26/21- patient with severe anemia today.  Reviewed with renal team for blood transfusion today.   Past Medical History     Arthritis   Asthma   COPD (chronic obstructive [place] disease) [place]   Diabetes mellitus without complication ([place]   Endometriosis   GERD (gastroesophageal reflux disease)   Hypertension   Obesity   Sleep apnea    CPAP    Significant Hospital Events   05/04/21: Admitted to the ICU with severe sepsis with shock secondary to cellulitis of LLE 1/23 off pressors +Afib with RVR 1/24 blood pressure low  1/24 to OR for fasciotomy 1/25 post op resp failure with severe septic shock 1/26 remains on pressors 1/27 returns to the OR today for debridement, wound VAC 1/28 overnight with hemodynamic deterioration, required reintubation, currently on pressors 1/29 remains on 3 pressors, CRRT, wound VAC and minimal sedation.  Awakens to voice and touch.  Seen by vascular surgery and awaiting input from general surgery regarding amputation of left leg 1/30 remains on vent remains on pressors 1/31 remains on pressors, on vent, on CRRT 2/1 remains on pressors, on  CRRT, MRI/CT shows SAH, afib with RVR, ECHO pending, restarted AMIO at 33 2/2 remains on vent plan for trach tomorrow 2/3 remains on crrt, remains on vent, plan for [facility] and wound vac change in OR 2/3 S/P TRACH,  Implantation of cellular matrix on left leg 2/4 REMAINS ON PRESSORS, REMAINS ON VENT, REMAINS ON CRRT 2/5 REMAINS ON VENT, OFF PRESSORS, REMAINS ON  CRRT  Significant Diagnostic Tests:  1/21: Chest Xray> no active cardiopulmonary process 1/21: CT left lower leg>Diffuse subcutaneous edema may represent cellulitis. No drainable fluid collection/abscess 1/21: Ultrasound lower unilateral left> no DVT   Micro Data:  1/21: SARS-CoV-2 PCR> negative 1/21: Influenza PCR> negative 1/21: Blood culture x2> Streptococcus pyogenes 1/21: Urine Culture> negative 1/21: MRSA PCR>> negative   Antimicrobials:  Vancomycin 1/21> off Cefepime 1/21> off Ceftriaxone 1/21 >> off Penicilli G 1/24>> Linezolid 1/25>>  *Antimicrobials per ID- zosyn now   Objective   Blood pressure (!) 111/55, pulse 85, temperature 99 F (37.2 C), resp. rate (!) 21, height _0  (1.651 m), weight 111.8 kg, SpO2 95 %. CVP:  [2 mmHg-15 mmHg] 9 mmHg  Vent Mode: PSV FiO2 (%):  [40 %-50 %] 40 % Set Rate:  [20 bmp] 20 bmp PEEP:  [5 cmH20] 5 cmH20 Pressure Support:  [5 cmH20] 5 cmH20 Plateau Pressure:  [15 cmH20] 15 cmH20   Intake/Output Summary (Last 24 hours) at 05/26/2021 2500 Last data filed at 05/26/2021 0800 Gross per 24 hour  Intake 1900.07 ml  Output 2980 ml  Net -1079.93 ml    Filed Weights   05/24/21 0500 05/24/21 1445 05/25/21 0500  Weight: 110.5 kg 119.7 kg 111.8 kg    REVIEW OF SYSTEMS  PATIENT IS UNABLE TO PROVIDE COMPLETE REVIEW OF SYSTEMS DUE TO SEVERE CRITICAL ILLNESS AND TOXIC METABOLIC ENCEPHALOPATHY    PHYSICAL EXAMINATION:  GENERAL:critically ill appearing, +resp distress EYES: Pupils equal, round, reactive to light.  No scleral icterus.  MOUTH: Moist mucosal membrane. INTUBATED NECK: Supple.  PULMONARY: Mild rhonchi bilaterally  CARDIOVASCULAR: S1 and S2.  No murmurs  GASTROINTESTINAL: Soft, nontender, -distended. Positive bowel sounds.  MUSCULOSKELETAL: No swelling, clubbing, or edema.  NEUROLOGIC: GCS8T MUSCULOSKELETAL: Left lower extremity with wound VAC in place, surgery redressing.  Status post fasciotomy     Assessment &  Plan    Admitted to the ICU with severe sepsis with shock secondary to cellulitis of LLE septic shock with LLL necrotizing fascitis post op resp failure and severe septic shock with progressive renal failure on CRRT and progressive multiorgan failure   Severe ACUTE Hypoxic and Hypercapnic Respiratory Failure       ETIOLOGY IS SEPTIC SHOCK WITH ATELECTASIS, CHF PULMONARY EDEMA AND PLEURAL EFFUSIONS ALL PRESENT ON ADMISSION -continue Mechanical Ventilator support -Wean Fio2 and PEEP as tolerated -VAP/VENT bundle implementation - Wean PEEP & FiO2 as tolerated, maintain SpO2 > 88% - Head of bed elevated 30 degrees, VAP protocol in place - Plateau pressures less than 30 cm H20  - Intermittent chest x-ray & ABG PRN - Ensure adequate pulmonary hygiene  S/P TRACH WEAN TO PS MODE CONSIDER TCT -SLP and PT/OT ordered -05/20/21    SEPTIC shock SOURCE-LLE-present on admission  Refractory Septic Shock secondary to LLE necrotizing fasciitis Streptococcal toxic shock syndrome use vasopressors to keep MAP>65 as needed -continue antibiotics as prescribed-zyvox and rocephin IV - Status postdebridement, wound VAC per surgery -Vascular and general surgery following S/p IVIG   ACUTE KIDNEY INJURY/Renal Failure -continue Foley Catheter-assess need -Avoid nephrotoxic agents -Follow urine output, BMP -  Ensure adequate renal perfusion, optimize oxygenation -Renal dose medications CRRT ongoing - GFR is now normal while being dialyzed  Intake/Output Summary (Last 24 hours) at 05/26/2021 4656 Last data filed at 05/26/2021 0800 Gross per 24 hour  Intake 1900.07 ml  Output 2980 ml  Net -1079.93 ml    SHOCK LIVER -imporved     - S/P supportive care for sepsis with improved liver enzymes and resolution of transaminitis     - resolved    Severe electrolyte derrangements     - pharmacy consultation       -improved   Acute encephalopathy  Toxic metabolic encephalopathy Loaded with keppra; continue  at 1gram bid-RESOLVED  MRI/CT HEAD SHOWS SMALL VOLUME [place] -Neurology consulted NO ANTICOAGULATION FOR  1 MONTH AND THEN NEED TO RE-ASSESS NO ANTIPLATELET AGENTS FOR 1 MONTH AND THEN NEED TO RE-ASSESS CAN POSSIBLY CONSIDER BABY ASA BUT WOULD NEED TO PROCEED WITH CAUTION CAN CONSIDER  DVT PROPHYLAXIS AFTER SURGERY BASED ON RISK OF BLEEDING   ENDO - ICU hypoglycemic\Hyperglycemia protocol -check FSBS per protocol   GI GI PROPHYLAXIS as indicated  NUTRITIONAL STATUS DIET-->TF's as tolerated Constipation protocol as indicated   ELECTROLYTES -follow labs as needed -replace as needed -pharmacy consultation and following  ENDO - ICU hypoglycemic\Hyperglycemia protocol -check FSBS per protocol   Best practice:  Diet: NPO Pain/Anxiety/Delirium protocol (if indicated): Yes, initiated VAP protocol (if indicated): Yes, initiated DVT prophylaxis: Subcutaneous Heparin GI prophylaxis: PPI Glucose control: ICU hyperglycemia protocol, insulin drip discontinued; SQ insulin Central venous access:  Rt IJV Arterial line:  N/A Foley:  Yes Mobility:  bed rest  PT consulted: N/A Last date of multidisciplinary goals of care discussion [1/27] Code Status:  full code Disposition: ICU    Labs   CBC: Recent Labs  Lab 05/22/21 0526 05/23/21 0500 05/24/21 0335 05/24/21 1300 05/24/21 1742 05/25/21 0059 05/25/21 0459 05/26/21 0514  WBC 17.2* 13.6* 13.9*  --   --   --  14.9* 20.8*  NEUTROABS 12.6* 10.3* 12.0*  --   --   --  13.7* 19.1*  HGB 7.8* 7.4* 6.9* 7.6* 7.4* 7.7* 7.4* 5.9*  HCT 24.1* 22.2* 21.7* 23.9* 23.0* 24.3* 24.0* 19.6*  MCV 87.0 85.1 87.5  --   --   --  92.7 94.7  PLT 228 248 268  --   --   --  292 221     Basic Metabolic Panel: Recent Labs  Lab 05/21/21 0402 05/22/21 0526 05/23/21 0500 05/23/21 1708 05/24/21 0335 05/24/21 0340 05/24/21 1742 05/25/21 0459 05/25/21 1600 05/26/21 0514  NA 134*   < > 134*   < >  --  133* 133* 135  133* 132*  K 4.6   < > 3.7   < >  --  3.6 3.4* 4.3 3.5 3.6  CL 101   < > 100   < >  --  101 101 100 101 98  CO2 22   < > 26   < >  --  24 21* 21* 23 25  GLUCOSE 134*   < > 180*   < >  --  189* 202* 228* 253* 214*  BUN 63*   < > 52*   < >  --  54* 55* 50* 54* 51*  CREATININE 1.69*   < > 1.95*   < >  --  1.79* 1.65* 1.56* 1.60* 1.47*  CALCIUM 9.2   < > 8.9   < >  --  9.0 8.8* 9.3  8.9 9.2  MG 2.6*  --  2.2  --  2.1  --   --  2.1  --  1.9  PHOS 2.8   < > 3.2   < >  --  3.2 3.3 3.0 2.2* 2.2*   < > = values in this interval not displayed.    GFR: Estimated Creatinine Clearance: 43.1 mL/min (A) (by C-G formula based on SCr of 1.47 mg/dL (H)). Recent Labs  Lab 05/23/21 0500 05/24/21 0335 05/25/21 0459 05/26/21 0514  WBC 13.6* 13.9* 14.9* 20.8*     Liver Function Tests: Recent Labs  Lab 05/22/21 1104 05/22/21 1615 05/24/21 0340 05/24/21 1742 05/25/21 0459 05/25/21 1600 05/26/21 0514  AST 102*  --   --   --   --   --   --   ALT 138*  --   --   --   --   --   --   ALKPHOS 323*  --   --   --   --   --   --   BILITOT 2.8*  --   --   --   --   --   --   PROT 6.3*  --   --   --   --   --   --   ALBUMIN 2.4*   < > 2.3* 2.2* 2.3* 2.3* 2.2*   < > = values in this interval not displayed.    Coagulation Profile: Recent Labs  Lab 05/22/21 0526 05/24/21 0335  INR 1.2 1.1     Cardiac Enzymes: No results for input(s): CKTOTAL, CKMB, CKMBINDEX, TROPONINI in the last 168 hours.   HbA1C: Hgb A1c MFr Bld  Date/Time Value Ref Range Status  05/05/2021 04:53 AM 6.7 (H) 4.8 - 5.6 % Final    Comment:    (NOTE)         Prediabetes: 5.7 - 6.4         Diabetes: >6.4         Glycemic control for adults with diabetes: <7.0   02/11/2021 09:41 AM 6.3 (H) <5.7 % of total Hgb Final    Comment:    For someone without known diabetes, a hemoglobin  A1c value between 5.7% and 6.4% is consistent with prediabetes and should be confirmed with a  follow-up test. . For someone with known  diabetes, a value <7% indicates that their diabetes is well controlled. A1c targets should be individualized based on duration of diabetes, age, comorbid conditions, and other considerations. . This assay result is consistent with an increased risk of diabetes. . Currently, no consensus exists regarding use of hemoglobin A1c for diagnosis of diabetes for children. .     CBG: Recent Labs  Lab 05/25/21 1858 05/25/21 2008 05/25/21 2345 05/26/21 0413 05/26/21 0713  GLUCAP 220* 190* 180* 219* 195*    No Known Allergies   Medications  Scheduled Meds:  sodium chloride   Intravenous Once   sodium chloride   Intravenous Once   amiodarone  200 mg Per Tube Daily   chlorhexidine gluconate (MEDLINE KIT)  15 mL Mouth Rinse BID   Chlorhexidine Gluconate Cloth  6 each Topical Q0600   free water  30 mL Per Tube Q4H   gentamicin irrigation  80 mg Irrigation QID   hydrocortisone sod succinate (SOLU-CORTEF) inj  100 mg Intravenous Q12H   insulin aspart  0-20 Units Subcutaneous Q4H   insulin aspart  4 Units Subcutaneous Q4H   insulin detemir  17 Units  Subcutaneous BID   magic mouthwash w/lidocaine  10 mL Oral QID   mouth rinse  15 mL Mouth Rinse 10 times per day   midodrine  10 mg Per Tube TID WC   multivitamin  1 tablet Per Tube QHS   nutrition supplement (JUVEN)  1 packet Per Tube BID BM   pantoprazole (PROTONIX) IV  40 mg Intravenous Q24H   povidone-Iodine   Topical Daily   sodium chloride flush  10-40 mL Intracatheter Q12H   Continuous Infusions:  sodium chloride 250 mL (05/24/21 0703)   albumin human Stopped (05/25/21 0936)   feeding supplement (PIVOT 1.5 CAL) 1,000 mL (05/26/21 0832)   norepinephrine (LEVOPHED) Adult infusion 8 mcg/min (05/26/21 0800)   piperacillin-tazobactam (ZOSYN)  IV Stopped (05/26/21 0607)   prismasol BGK 2/2.5 dialysis solution 1,000 mL/hr at 05/25/21 2036   prismasol BGK 2/2.5 replacement solution 400 mL/hr at 05/25/21 1003   prismasol BGK 2/2.5  replacement solution 400 mL/hr at 05/25/21 1003   vasopressin Stopped (05/24/21 2223)   PRN Meds:.acetaminophen, artificial tears, docusate, fentaNYL (SUBLIMAZE) injection, heparin, lip balm, midazolam, ondansetron (ZOFRAN) IV, oxyCODONE, sennosides, sodium chloride, sodium chloride flush    Critical care provider statement:   Total critical care time: 33 minutes   Performed by: Lanney Gins MD   Critical care time was exclusive of separately billable procedures and treating other patients.   Critical care was necessary to treat or prevent imminent or life-threatening deterioration.   Critical care was time spent personally by me on the following activities: development of treatment plan with patient and/or surrogate as well as nursing, discussions with consultants, evaluation of patient's response to treatment, examination of patient, obtaining history from patient or surrogate, ordering and performing treatments and interventions, ordering and review of laboratory studies, ordering and review of radiographic studies, pulse oximetry and re-evaluation of patient's condition.    Ottie Glazier, M.D.  Pulmonary & Critical Care Medicine

## 2021-05-26 NOTE — Consult Note (Signed)
PHARMACY CONSULT NOTE - FOLLOW UP  Pharmacy Consult for Electrolyte Monitoring and Replacement   Recent Labs: Potassium (mmol/L)  Date Value  05/26/2021 3.6  08/11/2013 3.3 (L)   Magnesium (mg/dL)  Date Value  05/26/2021 1.9   Calcium (mg/dL)  Date Value  05/26/2021 9.2   Calcium, Total (mg/dL)  Date Value  08/11/2013 9.2   Albumin (g/dL)  Date Value  05/26/2021 2.2 (L)  08/11/2013 3.1 (L)   Phosphorus (mg/dL)  Date Value  05/26/2021 2.2 (L)   Sodium (mmol/L)  Date Value  05/26/2021 132 (L)  08/11/2013 138   Assessment: 71 yo female with a previous medical history of diabetes mellitus type 2, hypertension, obstructive sleep apnea on CPAP, asthma/COPD, GERD, history of COVID-19 pneumonia presenting with lower left extremity pain and chills. Patient found to have severe sepsis and strep pyogenes bacteremia on admission and is currently intubated and sedated. LLL necrotizing fascitis. Small [facility]  Pharmacy has been consulted to monitor and replace electrolytes.  -CRRT stopped 2/6, re-started 2/8>> -s/p trach 2/3, tolerating trach collar trials  Potassium: 3.6  Nutrition:  --Feeds per tube @65ml /hr --Free water per tube 30 mL Q4H  Goal of Therapy:  Electrolytes within normal limits  Plan:  Continues on CRRT; prismasol 2/2.5  --No replacement currently needed --Renal function panels BID while on CRRT  Pearla Dubonnet, PharmD Clinical Pharmacist 05/26/2021 9:11 AM

## 2021-05-27 ENCOUNTER — Encounter: Payer: Self-pay | Admitting: General Surgery

## 2021-05-27 DIAGNOSIS — A419 Sepsis, unspecified organism: Secondary | ICD-10-CM | POA: Diagnosis not present

## 2021-05-27 DIAGNOSIS — B950 Streptococcus, group A, as the cause of diseases classified elsewhere: Secondary | ICD-10-CM | POA: Diagnosis not present

## 2021-05-27 DIAGNOSIS — R7881 Bacteremia: Secondary | ICD-10-CM | POA: Diagnosis not present

## 2021-05-27 DIAGNOSIS — R6521 Severe sepsis with septic shock: Secondary | ICD-10-CM | POA: Diagnosis not present

## 2021-05-27 DIAGNOSIS — D72829 Elevated white blood cell count, unspecified: Secondary | ICD-10-CM | POA: Diagnosis not present

## 2021-05-27 LAB — CBC WITH DIFFERENTIAL/PLATELET
Abs Immature Granulocytes: 0.27 10*3/uL — ABNORMAL HIGH (ref 0.00–0.07)
Basophils Absolute: 0.1 10*3/uL (ref 0.0–0.1)
Basophils Relative: 0 %
Eosinophils Absolute: 0.1 10*3/uL (ref 0.0–0.5)
Eosinophils Relative: 0 %
HCT: 24.2 % — ABNORMAL LOW (ref 36.0–46.0)
Hemoglobin: 7.5 g/dL — ABNORMAL LOW (ref 12.0–15.0)
Immature Granulocytes: 2 %
Lymphocytes Relative: 2 %
Lymphs Abs: 0.4 10*3/uL — ABNORMAL LOW (ref 0.7–4.0)
MCH: 29.3 pg (ref 26.0–34.0)
MCHC: 31 g/dL (ref 30.0–36.0)
MCV: 94.5 fL (ref 80.0–100.0)
Monocytes Absolute: 1.1 10*3/uL — ABNORMAL HIGH (ref 0.1–1.0)
Monocytes Relative: 6 %
Neutro Abs: 16.5 10*3/uL — ABNORMAL HIGH (ref 1.7–7.7)
Neutrophils Relative %: 90 %
Platelets: 148 10*3/uL — ABNORMAL LOW (ref 150–400)
RBC: 2.56 MIL/uL — ABNORMAL LOW (ref 3.87–5.11)
RDW: 21.1 % — ABNORMAL HIGH (ref 11.5–15.5)
Smear Review: NORMAL
WBC Morphology: INCREASED
WBC: 18.4 10*3/uL — ABNORMAL HIGH (ref 4.0–10.5)
nRBC: 10.6 % — ABNORMAL HIGH (ref 0.0–0.2)

## 2021-05-27 LAB — TYPE AND SCREEN
ABO/RH(D): O POS
Antibody Screen: NEGATIVE
Unit division: 0
Unit division: 0
Unit division: 0

## 2021-05-27 LAB — RENAL FUNCTION PANEL
Albumin: 2.3 g/dL — ABNORMAL LOW (ref 3.5–5.0)
Albumin: 2.4 g/dL — ABNORMAL LOW (ref 3.5–5.0)
Anion gap: 11 (ref 5–15)
Anion gap: 8 (ref 5–15)
BUN: 47 mg/dL — ABNORMAL HIGH (ref 8–23)
BUN: 57 mg/dL — ABNORMAL HIGH (ref 8–23)
CO2: 26 mmol/L (ref 22–32)
CO2: 25 mmol/L (ref 22–32)
Calcium: 9.3 mg/dL (ref 8.9–10.3)
Calcium: 9.1 mg/dL (ref 8.9–10.3)
Chloride: 99 mmol/L (ref 98–111)
Chloride: 101 mmol/L (ref 98–111)
Creatinine, Ser: 1.36 mg/dL — ABNORMAL HIGH (ref 0.44–1.00)
Creatinine, Ser: 1.34 mg/dL — ABNORMAL HIGH (ref 0.44–1.00)
GFR, Estimated: 41 mL/min — ABNORMAL LOW (ref >60)
GFR, Estimated: 42 mL/min — ABNORMAL LOW (ref >60)
Glucose, Bld: 256 mg/dL — ABNORMAL HIGH (ref 70–99)
Glucose, Bld: 198 mg/dL — ABNORMAL HIGH (ref 70–99)
Phosphorus: 2.3 mg/dL — ABNORMAL LOW (ref 2.5–4.6)
Phosphorus: 3.7 mg/dL (ref 2.5–4.6)
Potassium: 3.6 mmol/L (ref 3.5–5.1)
Potassium: 3.6 mmol/L (ref 3.5–5.1)
Sodium: 136 mmol/L (ref 135–145)
Sodium: 134 mmol/L — ABNORMAL LOW (ref 135–145)

## 2021-05-27 LAB — GLUCOSE, CAPILLARY
Glucose-Capillary: 148 mg/dL — ABNORMAL HIGH (ref 70–99)
Glucose-Capillary: 157 mg/dL — ABNORMAL HIGH (ref 70–99)
Glucose-Capillary: 178 mg/dL — ABNORMAL HIGH (ref 70–99)
Glucose-Capillary: 189 mg/dL — ABNORMAL HIGH (ref 70–99)
Glucose-Capillary: 189 mg/dL — ABNORMAL HIGH (ref 70–99)
Glucose-Capillary: 200 mg/dL — ABNORMAL HIGH (ref 70–99)
Glucose-Capillary: 270 mg/dL — ABNORMAL HIGH (ref 70–99)

## 2021-05-27 LAB — BPAM RBC
Blood Product Expiration Date: 202303082359
Blood Product Expiration Date: 202303142359
Blood Product Expiration Date: 202303142359
ISSUE DATE / TIME: 202302100515
ISSUE DATE / TIME: 202302120748
ISSUE DATE / TIME: 202302120917
Unit Type and Rh: 5100
Unit Type and Rh: 5100
Unit Type and Rh: 5100

## 2021-05-27 LAB — AEROBIC/ANAEROBIC CULTURE W GRAM STAIN (SURGICAL/DEEP WOUND)

## 2021-05-27 LAB — MAGNESIUM: Magnesium: 2 mg/dL (ref 1.7–2.4)

## 2021-05-27 MED ORDER — ASCORBIC ACID 500 MG PO TABS
500.0000 mg | ORAL_TABLET | Freq: Two times a day (BID) | ORAL | Status: DC
  Administered 2021-05-27: 500 mg
  Filled 2021-05-27 (×2): qty 1

## 2021-05-27 MED ORDER — VITAMIN A 3 MG (10000 UNIT) PO CAPS
10000.0000 [IU] | ORAL_CAPSULE | Freq: Every day | ORAL | Status: DC
  Administered 2021-05-30 – 2021-05-31 (×2): 10000 [IU]
  Filled 2021-05-27 (×8): qty 1

## 2021-05-27 MED ORDER — PRISMASOL BGK 4/2.5 32-4-2.5 MEQ/L REPLACEMENT SOLN
Status: DC
  Filled 2021-05-27 (×10): qty 5000

## 2021-05-27 MED ORDER — PRISMASOL BGK 4/2.5 32-4-2.5 MEQ/L EC SOLN: Status: DC

## 2021-05-27 MED ORDER — PROSOURCE TF PO LIQD
90.0000 mL | Freq: Two times a day (BID) | ORAL | Status: AC
  Administered 2021-05-27 (×2): 90 mL
  Filled 2021-05-27 (×2): qty 90

## 2021-05-27 MED ORDER — ALBUMIN HUMAN 25 % IV SOLN
25.0000 g | Freq: Four times a day (QID) | INTRAVENOUS | Status: AC
  Administered 2021-05-27 – 2021-05-28 (×4): 25 g via INTRAVENOUS
  Filled 2021-05-27 (×4): qty 100

## 2021-05-27 MED ORDER — ZINC SULFATE 220 (50 ZN) MG PO CAPS
220.0000 mg | ORAL_CAPSULE | Freq: Every day | ORAL | Status: DC
  Administered 2021-05-29 – 2021-06-06 (×9): 220 mg
  Filled 2021-05-27 (×10): qty 1

## 2021-05-27 MED ORDER — SODIUM PHOSPHATES 45 MMOLE/15ML IV SOLN
15.0000 mmol | Freq: Once | INTRAVENOUS | Status: AC
  Administered 2021-05-27: 15 mmol via INTRAVENOUS
  Filled 2021-05-27: qty 5

## 2021-05-27 MED ORDER — DARBEPOETIN ALFA 100 MCG/0.5ML IJ SOSY
100.0000 ug | PREFILLED_SYRINGE | INTRAMUSCULAR | Status: DC
  Administered 2021-05-27 – 2021-06-10 (×3): 100 ug via SUBCUTANEOUS
  Filled 2021-05-27 (×4): qty 0.5

## 2021-05-27 NOTE — Progress Notes (Signed)
°   05/27/21 1000  Clinical Encounter Type  Visited With Patient and family together  Visit Type Follow-up;Social support  Stress Factors  Family Stress Factors Major life changes   Chaplain visited predominantly with patient's family however, was able to engage a little bit with the patient for the first time. Chaplain provided support with compassionate presence and conversation. Chaplain also noticed on chart to encourage upper limb movement and so Chaplain gave verbal directives to wave hand and say hi etc.

## 2021-05-27 NOTE — Progress Notes (Signed)
Patient ID: Alexandra Foster, female   DOB: 05-21-1950, 71 y.o.   MRN: [id number]     [facility] Day(s): 23.   Interval History: Patient seen and examined, no acute events or new complaints overnight. Patient more alert able to answer simple questions with yes or no gestures. No issues with negative pressure dressing as per nurse.   Vital signs in last 24 hours: [min-max] current  Temp:  [98.3 F (36.8 C)-99 F (37.2 C)] 99 F (37.2 C) (02/13 1215) Pulse Rate:  [63-87] 77 (02/13 1215) Resp:  [13-23] 16 (02/13 1215) BP: (106-149)/(43-71) 135/61 (02/13 1215) SpO2:  [95 %-97 %] 97 % (02/13 1215) FiO2 (%):  [40 %] 40 % (02/13 1135) Weight:  [111.5 kg] 111.5 kg (02/13 0500)     Height: '5\' 5"'  (165.1 cm) Weight: 111.5 kg BMI (Calculated): 40.91   Physical Exam:  Constitutional: alert, cooperative and no distress  Extremity: Left leg covered with negative pressure dressing. Left foot with with adequate pulse. Able to move feet softly.   Labs:  CBC Latest Ref Rng & Units 05/27/2021 05/26/2021 05/26/2021  WBC 4.0 - 10.5 K/uL 18.4(H) - 20.8(H)  Hemoglobin 12.0 - 15.0 g/dL 7.5(L) 8.0(L) 5.9(L)  Hematocrit 36.0 - 46.0 % 24.2(L) 25.8(L) 19.6(L)  Platelets 150 - 400 K/uL 148(L) - 221   CMP Latest Ref Rng & Units 05/27/2021 05/26/2021 05/26/2021  Glucose 70 - 99 mg/dL 256(H) 253(H) 214(H)  BUN 8 - 23 mg/dL 47(H) 48(H) 51(H)  Creatinine 0.44 - 1.00 mg/dL 1.36(H) 1.32(H) 1.47(H)  Sodium 135 - 145 mmol/L 136 132(L) 132(L)  Potassium 3.5 - 5.1 mmol/L 3.6 3.6 3.6  Chloride 98 - 111 mmol/L 99 99 98  CO2 22 - 32 mmol/L '26 25 25  ' Calcium 8.9 - 10.3 mg/dL 9.3 9.2 9.2  Total Protein 6.5 - 8.1 g/dL - - -  Total Bilirubin 0.3 - 1.2 mg/dL - - -  Alkaline Phos 38 - 126 U/L - - -  AST 15 - 41 U/L - - -  ALT 0 - 44 U/L - - -    Imaging studies: No new pertinent imaging studies   Assessment/Plan:  71 y.o. female with left leg necrotizing fasciitis with 23 Day Post-Op s/p  debridement, complicated by pertinent comorbidities including strep pyogenes bacteremia now in septic shock (resolved), respiratory failure mechanical ventilation (resolved), liver failure (resolved), acute renal failure on CRRT, COPD, type 2 diabetes mellitus, hypertension, obstructive sleep apnea, GERD   Left leg necrotizing fasciitis -S/p debridement 05/07/2021 -Second debridement on 05/10/2021.  Application of wound VAC system -S/p myriad matrix placement on 05/17/2021 -No clinical deterioration. Doing well with negative pressure dressings.  -Possible change of negative pressure dressing tomorrow -I will continue to follow closely.  Arnold Long, MD

## 2021-05-27 NOTE — Progress Notes (Signed)
[facility]  PROGRESS NOTE   Subjective:   CRRT continued. UF goal of net even. UOP minimal. Dialysis Cartridge was changed yesterday Norepinephrine gtt continued PRBC transfusion over the weekend Brother at bedside Plan for PEG and wound vac change on tuesday  Objective:  Vital signs in last 24 hours:  Temp:  [98.3 F (36.8 C)-99 F (37.2 C)] 98.8 F (37.1 C) (02/13 0400) Pulse Rate:  [63-87] 75 (02/13 1100) Resp:  [13-23] 18 (02/13 1100) BP: (106-149)/(43-71) 114/61 (02/13 1100) SpO2:  [95 %-97 %] 96 % (02/13 1135) FiO2 (%):  [40 %] 40 % (02/13 1135) Weight:  [111.5 kg] 111.5 kg (02/13 0500)  Weight change:  Filed Weights   05/24/21 1445 05/25/21 0500 05/27/21 0500  Weight: 119.7 kg 111.8 kg 111.5 kg    Intake/Output: I/O last 3 completed shifts: In: 3022.1 [I.V.:273.6; Blood:630; NG/GT:1922.3; IV Piggyback:196.1] Out: 6389 [Drains:250; HTDSK:8768; Stool:225]   Intake/Output this shift:  Total I/O In: 394.7 [I.V.:54.6; NG/GT:340; IV Piggyback:0.1] Out: 330 [Other:330]  Physical Exam: General:  Critically ill  Head:  [place]/AT  Eyes:  Anicteric  Neck:  Trachea collar, tracheostomy  Lungs:   diminished bilaterally,    Heart:  regular  Abdomen:   Soft, nontender  Extremities:  Left lower extremity wound vac  Neurologic: Able to follow simple commands   Skin:  No lesions  Access: Left IJ temp HD catheter    Basic Metabolic Panel: Recent Labs  Lab 05/23/21 0500 05/23/21 1708 05/24/21 0335 05/24/21 0340 05/25/21 0459 05/25/21 1600 05/26/21 0514 05/26/21 1358 05/27/21 0455  NA 134*   < >  --    < > 135 133* 132* 132* 136  K 3.7   < >  --    < > 4.3 3.5 3.6 3.6 3.6  CL 100   < >  --    < > 100 101 98 99 99  CO2 26   < >  --    < > 21* '23 25 25 26  ' GLUCOSE 180*   < >  --    < > 228* 253* 214* 253* 256*  BUN 52*   < >  --    < > 50* 54* 51* 48* 47*  CREATININE 1.95*   < >  --    < > 1.56* 1.60* 1.47* 1.32* 1.36*  CALCIUM 8.9   < >  --    < >  9.3 8.9 9.2 9.2 9.3  MG 2.2  --  2.1  --  2.1  --  1.9  --  2.0  PHOS 3.2   < >  --    < > 3.0 2.2* 2.2* 2.4* 2.3*   < > = values in this interval not displayed.     CBC: Recent Labs  Lab 05/23/21 0500 05/24/21 0335 05/24/21 1300 05/25/21 0059 05/25/21 0459 05/26/21 0514 05/26/21 1215 05/27/21 0455  WBC 13.6* 13.9*  --   --  14.9* 20.8*  --  18.4*  NEUTROABS 10.3* 12.0*  --   --  13.7* 19.1*  --  16.5*  HGB 7.4* 6.9*   < > 7.7* 7.4* 5.9* 8.0* 7.5*  HCT 22.2* 21.7*   < > 24.3* 24.0* 19.6* 25.8* 24.2*  MCV 85.1 87.5  --   --  92.7 94.7  --  94.5  PLT 248 268  --   --  292 221  --  148*   < > = values in this interval not displayed.  Urinalysis: No results for input(s): COLORURINE, LABSPEC, PHURINE, GLUCOSEU, HGBUR, BILIRUBINUR, KETONESUR, PROTEINUR, UROBILINOGEN, NITRITE, LEUKOCYTESUR in the last 72 hours.  Invalid input(s): APPERANCEUR    Imaging: No results found.   Medications:     prismasol BGK 4/2.5      prismasol BGK 4/2.5     sodium chloride 250 mL (05/27/21 1100)   albumin human 12.5 g (05/27/21 1046)   norepinephrine (LEVOPHED) Adult infusion 5 mcg/min (05/27/21 1100)   piperacillin-tazobactam (ZOSYN)  IV 3.375 g (05/27/21 1114)   prismasol BGK 4/2.5     sodium phosphate  Dextrose 5% IVPB 15 mmol (05/27/21 1059)   vasopressin Stopped (05/24/21 2223)    sodium chloride   Intravenous Once   sodium chloride   Intravenous Once   amiodarone  200 mg Per Tube Daily   chlorhexidine gluconate (MEDLINE KIT)  15 mL Mouth Rinse BID   Chlorhexidine Gluconate Cloth  6 each Topical Q0600   free water  30 mL Per Tube Q4H   gentamicin irrigation  80 mg Irrigation QID   hydrocortisone sod succinate (SOLU-CORTEF) inj  100 mg Intravenous Q12H   insulin aspart  0-20 Units Subcutaneous Q4H   insulin aspart  4 Units Subcutaneous Q4H   insulin detemir  17 Units Subcutaneous BID   magic mouthwash w/lidocaine  10 mL Oral QID   mouth rinse  15 mL Mouth Rinse 10 times  per day   midodrine  10 mg Per Tube TID WC   multivitamin  1 tablet Per Tube QHS   nutrition supplement (JUVEN)  1 packet Per Tube BID BM   pantoprazole (PROTONIX) IV  40 mg Intravenous Q24H   sodium chloride flush  10-40 mL Intracatheter Q12H    Assessment/ Plan:     Principal Problem:   Severe sepsis with septic shock (CODE) (HCC) Active Problems:   COPD (chronic obstructive pulmonary disease) (HCC)   Morbid obesity (HCC)   Diabetes mellitus type 2 in obese (HCC)   Atrial fibrillation and flutter (HCC)   Necrotizing fasciitis ([place])   Acute on chronic respiratory failure (HCC)   On mechanically assisted ventilation (HCC)   Shock liver   Anemia associated with acute blood loss   Metabolic acidosis   Encounter for continuous renal replacement therapy (CRRT) for acute renal failure (HCC)   Acute encephalopathy   Group A streptococcal infection   Streptococcal toxic shock syndrome ([place])   Status post tracheostomy ([place])   Subdural hemorrhage ([place])   Left leg cellulitis  Alexandra Foster is a 71 y.o. black female with COPD/asthma, diabetes mellitus type II, hypertension, obstructive sleep apnea, paroxysmal atrial fibrillation, GERD, who presents to [facility] 05/04/2021 for AKI (acute kidney injury) ([place]) [N17.9] Left leg cellulitis [L03.116] Severe sepsis ([place]) [A41.9, R65.20] Severe sepsis with lactic acidosis ([place]) [A41.9, R65.20, E87.20] Found to have necrotizing fascitis requiring multiple debridements by Dr. Peyton Najjar: 1/24. 1/27, 2/3 and 2/10.    #1: Acute kidney injury: AKI is secondary to ischemic nephropathy secondary to hypotension complicated with sepsis. History of bland urine. Baseline creatinine of 0.98, GFR > 60 on 02/11/21. Anuric urine output.  - Continue continuous renal replacement therapy. Net even ultrafiltration.  - IV albumin   - change to 4 K bath - plan for IHD on Wednesday; may d/c CRRT prior to going to OR/PEG tuesday   #2: Sepsis and hypotension:  necrotizing fasciitis for left lower extremity status post multiple debridement. Continues to lose volume from wound vac. Requiring vasopressors: norepinephrine. On  stress dose steroids and midodrine - Continue zosyn. - Appreciate ID input  #3. Anemia with kidney injury:  Lab Results  Component Value Date   HGB 7.5 (L) 05/27/2021    S/p blood transfusions this admission Aranesp weekly SQ (mon)  #4. Acute Respiratory Failure Management as per ICU team    LOS: [place], MD [facility] 2/13/202311:50 AM

## 2021-05-27 NOTE — Consult Note (Signed)
Alexandra Foster, Alexandra Foster [id number] [birth year] [name], MD  Reason for Consult: Lurline Idol leak Requesting Physician: Flora Lipps, MD Consulting Physician: [name], MD  HPI: This 71 y.o. year old female was admitted on 05/04/2021 for AKI (acute kidney injury) [facility]) [N17.9] Left leg cellulitis [L03.116] Severe sepsis ([place]) [A41.9, R65.20] Severe sepsis with lactic acidosis ([place]) [A41.9, R65.20, E87.20]. Patient has developed leak around trach, request assistance.   Medications:  Current Facility-Administered Medications  Medication Dose Route Frequency Provider Last Rate Last Admin    prismasol BGK 4/2.5 infusion   CRRT Continuous Murlean Iba, MD 400 mL/hr at 05/27/21 1339 New Bag at 05/27/21 1339    prismasol BGK 4/2.5 infusion   CRRT Continuous Murlean Iba, MD 400 mL/hr at 05/27/21 1339 New Bag at 05/27/21 1339   0.9 %  sodium chloride infusion (Manually program via Guardrails IV Fluids)   Intravenous Once Lang Snow, NP       0.9 %  sodium chloride infusion (Manually program via Guardrails IV Fluids)   Intravenous Once Rust-Chester, Huel Cote, NP   Held at 05/26/21 0758   0.9 %  sodium chloride infusion  250 mL Intravenous Continuous Bennie Pierini, MD 10 mL/hr at 05/27/21 1100 250 mL at 05/27/21 1100   acetaminophen (TYLENOL) tablet 650 mg  650 mg Per NG tube Q6H PRN Ottie Glazier, MD   650 mg at 05/22/21 1551   albumin human 25 % solution 12.5 g  12.5 g Intravenous Daily Ottie Glazier, MD 60 mL/hr at 05/27/21 1046 12.5 g at 05/27/21 1046   amiodarone (PACERONE) tablet 200 mg  200 mg Per Tube Daily Darel Hong D, NP   200 mg at 05/27/21 1031   artificial tears (LACRILUBE) ophthalmic ointment   Both Eyes Q4H PRN Flora Lipps, MD   Given at 05/16/21 2313   ascorbic acid (VITAMIN C) tablet 500 mg  500 mg Per Tube BID Flora Lipps, MD       chlorhexidine gluconate (MEDLINE KIT) (PERIDEX) 0.12 % solution 15 mL  15 mL Mouth Rinse BID Tyler Pita, MD   15 mL  at 05/27/21 4599   Chlorhexidine Gluconate Cloth 2 % PADS 6 each  6 each Topical Q0600 Bennie Pierini, MD   6 each at 05/27/21 1230   Darbepoetin Alfa (ARANESP) injection 100 mcg  100 mcg Subcutaneous Q7 days Murlean Iba, MD   100 mcg at 05/27/21 1412   docusate (COLACE) 50 MG/5ML liquid 100 mg  100 mg Per Tube BID PRN Teressa Lower, NP       feeding supplement (PROSource TF) liquid 90 mL  90 mL Per Tube BID Flora Lipps, MD   90 mL at 05/27/21 1413   fentaNYL (SUBLIMAZE) injection 25-50 mcg  25-50 mcg Intravenous Q2H PRN Ottie Glazier, MD   50 mcg at 05/27/21 1515   free water 30 mL  30 mL Per Tube Q4H Aleskerov, Fuad, MD   30 mL at 05/27/21 1611   gentamicin (GARAMYCIN) 80 mg in sodium chloride 0.9 % 500 mL irrigation  80 mg Irrigation QID Clyde Canterbury, MD   80 mg at 05/27/21 1646   heparin injection 1,000-6,000 Units  1,000-6,000 Units CRRT PRN Kolluru, Lurena Nida, MD   2,600 Units at 05/26/21 1646   hydrocortisone sodium succinate (SOLU-CORTEF) 100 MG injection 100 mg  100 mg Intravenous Q12H Darel Hong D, NP   100 mg at 05/27/21 1032   insulin aspart (novoLOG) injection 0-20 Units  0-20 Units Subcutaneous Q4H  Rust-Chester, Huel Cote, NP   4 Units at 05/27/21 1536   insulin aspart (novoLOG) injection 4 Units  4 Units Subcutaneous Q4H Ottie Glazier, MD   4 Units at 05/27/21 0759   insulin detemir (LEVEMIR) injection 17 Units  17 Units Subcutaneous BID Ottie Glazier, MD   17 Units at 05/27/21 1033   lip balm (BLISTEX) ointment   Topical PRN Flora Lipps, MD   Given at 05/23/21 1940   magic mouthwash w/lidocaine  10 mL Oral QID Rust-Chester, Huel Cote, NP   10 mL at 05/27/21 1436   MEDLINE mouth rinse  15 mL Mouth Rinse 10 times per day Tyler Pita, MD   15 mL at 05/27/21 1613   midazolam (VERSED) injection 1-2 mg  1-2 mg Intravenous Q2H PRN Rust-Chester, Huel Cote, NP   2 mg at 05/27/21 1634   midodrine (PROAMATINE) tablet 10 mg  10 mg Per Tube TID WC Flora Lipps, MD   10  mg at 05/27/21 1603   multivitamin (RENA-VIT) tablet 1 tablet  1 tablet Per Tube QHS Ottie Glazier, MD   1 tablet at 05/26/21 2229   norepinephrine (LEVOPHED) 16 mg in 268m premix infusion  0-40 mcg/min Intravenous Titrated Kolluru, Sarath, MD 1.88 mL/hr at 05/27/21 1600 2 mcg/min at 05/27/21 1600   nutrition supplement (JUVEN) (JUVEN) powder packet 1 packet  1 packet Per Tube BID BM AOttie Glazier MD   1 packet at 05/27/21 1413   ondansetron (ZOFRAN) injection 4 mg  4 mg Intravenous Q6H PRN GTyler Pita MD   4 mg at 05/20/21 01610  oxyCODONE (Oxy IR/ROXICODONE) immediate release tablet 5-10 mg  5-10 mg Per Tube Q6H PRN AOttie Glazier MD   10 mg at 05/27/21 1031   pantoprazole (PROTONIX) injection 40 mg  40 mg Intravenous Q24H Rust-Chester, Britton L, NP   40 mg at 05/27/21 0454   piperacillin-tazobactam (ZOSYN) IVPB 3.375 g  3.375 g Intravenous QIsabel Caprice MD   Stopped at 05/27/21 1514   prismasol BGK 4/2.5 infusion   CRRT Continuous SMurlean Iba MD 1,000 mL/hr at 05/27/21 1339 New Bag at 05/27/21 1339   sennosides (SENOKOT) 8.8 MG/5ML syrup 10 mL  10 mL Per Tube QHS PRN Tukov-Yual, Magdalene S, NP   10 mL at 05/14/21 2105   sodium chloride 0.9 % primer fluid for CRRT   CRRT PRN Kolluru, SLurena Nida MD       sodium chloride flush (NS) 0.9 % injection 10-40 mL  10-40 mL Intracatheter Q12H AKathlyn SacramentoA, MD   30 mL at 05/27/21 1034   sodium chloride flush (NS) 0.9 % injection 10-40 mL  10-40 mL Intracatheter PRN AWellington Hampshire MD       sodium phosphate 15 mmol in dextrose 5 % 250 mL infusion  15 mmol Intravenous Once BLorna Dibble RPH 43 mL/hr at 05/27/21 1600 Infusion Verify at 05/27/21 1600   vasopressin (PITRESSIN) 20 Units in sodium chloride 0.9 % 100 mL infusion-*FOR SHOCK*  0-0.03 Units/min Intravenous Continuous Rust-Chester, BHuel Cote NP   Stopped at 05/24/21 2223   [START ON 05/28/2021] vitamin A capsule 10,000 Units  10,000 Units Per Tube Daily KFlora Lipps MD       [Derrill MemoON 05/28/2021] zinc sulfate capsule 220 mg  220 mg Per Tube Daily KFlora Lipps MD      .  Medications Prior to Admission  Medication Sig Dispense Refill   albuterol (PROVENTIL) (2.5 MG/3ML) 0.083% nebulizer solution Take 3 mLs (2.5  mg total) by nebulization every 6 (six) hours as needed for wheezing or shortness of breath. 150 mL 1   aspirin 81 MG tablet Take 1 tablet (81 mg total) by mouth daily. 30 tablet 0   atorvastatin (LIPITOR) 40 MG tablet Take 1 tablet (40 mg total) by mouth daily. 90 tablet 1   azelastine (ASTELIN) 0.1 % nasal spray Place 2 sprays into both nostrils 2 (two) times daily as needed for rhinitis or allergies. 30 mL 1   baclofen (LIORESAL) 10 MG tablet Take 10 mg by mouth daily.     celecoxib (CELEBREX) 200 MG capsule Take 200 mg by mouth daily.     Cetirizine HCl (ZYRTEC ALLERGY) 10 MG TBDP Take 10 mg by mouth at bedtime. For sinus symptoms and seasonal allergies 30 tablet 1   docusate sodium (COLACE) 100 MG capsule Take 100 mg by mouth daily.     fluticasone (FLONASE) 50 MCG/ACT nasal spray      glucose blood (ACCU-CHEK AVIVA PLUS) test strip Use as instructed 100 each 12   Lancets (ACCU-CHEK SOFT TOUCH) lancets Use as instructed 100 each 12   meloxicam (MOBIC) 15 MG tablet Take 15 mg by mouth daily.     metFORMIN (GLUCOPHAGE-XR) 750 MG 24 hr tablet Take 1 tablet (750 mg total) by mouth daily with breakfast. 90 tablet 1   montelukast (SINGULAIR) 10 MG tablet Take 1 tablet (10 mg total) by mouth at bedtime. 90 tablet 1   Multiple Vitamins-Minerals (MULTIVITAL PO) Take 1 tablet by mouth daily.     omeprazole (PRILOSEC) 20 MG capsule Take 1 capsule (20 mg total) by mouth daily. 90 capsule 1   ORTHOVISC 30 MG/2ML SOSY      triamterene-hydrochlorothiazide (DYAZIDE) 37.5-25 MG capsule TAKE 1 CAPSULE EVERY DAY 90 capsule 1   vitamin B-12 (CYANOCOBALAMIN) 100 MCG tablet Take 100 mcg by mouth daily.     calcium carbonate (OSCAL) 1500 (600 Ca) MG TABS  tablet Take 600 mg of elemental calcium by mouth 2 (two) times daily with a meal. (Patient not taking: Reported on 05/05/2021)     Fluticasone-Umeclidin-Vilant (TRELEGY ELLIPTA) 100-62.5-25 MCG/INH AEPB Inhale 1 puff into the lungs daily. In place of Stiolto 180 each 1   loratadine (CLARITIN) 10 MG tablet Take 1 tablet (10 mg total) by mouth daily. 90 tablet 1    Allergies: No Known Allergies  PMH:  Past Medical History:  Diagnosis Date   AKI (acute kidney injury) ([place])    a. 04/2021 in setting of bacteremia/shock.   Arthritis    Asthma    Bacteremia    a. 04/2021 S pyogenes bacteremia in setting of lower ext cellulitis.   COPD (chronic obstructive pulmonary disease) (HCC)    Diabetes mellitus without complication (HCC)    Endometriosis    GERD (gastroesophageal reflux disease)    History of echocardiogram    a. 07/2013 Echo: EF 55-60%, impaired relaxation, mild TR; b. 04/2021 Echo: EF 50-55%, mild LVH, nl RV fxn, mild BAE, Ao sclerosis w/o stenosis.   Hypertension    Obesity    PAF (paroxysmal atrial fibrillation) ([place])    a. 04/2021 in setting of septic shock/cellulitis.   Sleep apnea    CPAP    Fam Hx:  Family History  Problem Relation Age of Onset   Congestive Heart Failure Mother    Coronary artery disease Father 66   Breast cancer Sister 59   Heart disease Brother    Varicose Veins Brother    Alcohol  abuse Brother     Soc Hx:  Social History   Socioeconomic History   Marital status: Married    Spouse name: Trilby Drummer   Number of children: 1   Years of education: 12   Highest education level: 12th grade  Occupational History    Employer: RETIRED  Tobacco Use   Smoking status: Former    Packs/day: 2.00    Years: 40.00    Pack years: 80.00    Types: Cigarettes    Quit date: 2003    Years since quitting: 20.1   Smokeless tobacco: Former    Types: Snuff    Quit date: 04/2001   Tobacco comments:    smoking cessation materials not required  Vaping Use   Vaping  Use: Never used  Substance and Sexual Activity   Alcohol use: No    Alcohol/week: 0.0 standard drinks   Drug use: No   Sexual activity: Not Currently  Other Topics Concern   Not on file  Social History Narrative   Lives locally w/ husband.  Does not routinely exercise.   Social Determinants of Health   Financial Resource Strain: Medium Risk   Difficulty of Paying Living Expenses: Somewhat hard  Food Insecurity: No Food Insecurity   Worried About Charity fundraiser in the Last Year: Never true   Ran Out of Food in the Last Year: Never true  Transportation Needs: No Transportation Needs   Lack of Transportation (Medical): No   Lack of Transportation (Non-Medical): No  Physical Activity: Inactive   Days of Exercise per Week: 0 days   Minutes of Exercise per Session: 0 min  Stress: No Stress Concern Present   Feeling of Stress : Not at all  Social Connections: Moderately Integrated   Frequency of Communication with Friends and Family: More than three times a week   Frequency of Social Gatherings with Friends and Family: More than three times a week   Attends Religious Services: More than 4 times per year   Active Member of Clubs or Organizations: No   Attends Archivist Meetings: Never   Marital Status: Married  Human resources officer Violence: Not At Risk   Fear of Current or Ex-Partner: No   Emotionally Abused: No   Physically Abused: No   Sexually Abused: No    PSH:  Past Surgical History:  Procedure Laterality Date   ABDOMINAL HYSTERECTOMY     APPLICATION OF WOUND VAC  05/10/2021   Procedure: APPLICATION OF WOUND VAC;  Surgeon: Herbert Pun, MD;  Location: [facility];  Service: General;;   APPLICATION OF WOUND VAC Left 05/17/2021   Procedure: APPLICATION OF WOUND VAC/WOUND VAC EXCHANGE-Matrix Myriad;  Surgeon: Herbert Pun, MD;  Location: [facility];  Service: General;  Laterality: Left;   APPLICATION OF WOUND VAC  05/24/2021   Procedure: APPLICATION OF  WOUND VAC;  Surgeon: Herbert Pun, MD;  Location: [facility];  Service: General;;   COLONOSCOPY  10/29/2006   Dr Allen Norris   COLONOSCOPY WITH PROPOFOL N/A 11/05/2016   Procedure: COLONOSCOPY WITH PROPOFOL;  Surgeon: Robert Bellow, MD;  Location: [facility];  Service: Endoscopy;  Laterality: N/A;   INCISION AND DRAINAGE OF WOUND Left 05/10/2021   Procedure: IRRIGATION AND DEBRIDEMENT LEFT LEG;  Surgeon: Herbert Pun, MD;  Location: [facility];  Service: General;  Laterality: Left;   INCISION AND DRAINAGE OF WOUND Left 05/24/2021   Procedure: IRRIGATION AND DEBRIDEMENT LEFT LEG;  Surgeon: Herbert Pun, MD;  Location: [facility];  Service: General;  Laterality: Left;   MINOR GRAFT APPLICATION  2/75/1700   Procedure: Myriad Matrix  APPLICATION;  Surgeon: Herbert Pun, MD;  Location: [facility];  Service: General;;   NASAL SINUS SURGERY  2002   Dr Carlis Abbott   TRACHEOSTOMY TUBE PLACEMENT N/A 05/17/2021   Procedure: TRACHEOSTOMY;  Surgeon: Clyde Canterbury, MD;  Location: [facility];  Service: ENT;  Laterality: N/A;   WOUND DEBRIDEMENT Left 05/07/2021   Procedure: DEBRIDEMENT WOUND;  Surgeon: Herbert Pun, MD;  Location: [facility];  Service: General;  Laterality: Left;  . Procedures since admission: No admission procedures for hospital encounter.  ROS: Review of systems normal other than 12 systems except per HPI.  PHYSICAL EXAM  Vitals: Blood pressure (!) 96/39, pulse 84, temperature 98.1 F (36.7 C), temperature source Axillary, resp. rate 18, height '5\' 5"'  (1.651 m), weight 111.5 kg, SpO2 100 %.. Neck: Trach wound looks cleaner but still slow to granulate in. Trach tube was almost out with cuff within the wound. Removed trach and was able to easily replace with a #8 distal XLT.   MEDICAL DECISION MAKING: Data Review:  Results for orders placed or performed during the hospital encounter of 05/04/21 (from the past 48 hour(s))  Glucose, capillary     Status: Abnormal    Collection Time: 05/25/21  6:58 PM  Result Value Ref Range   Glucose-Capillary 220 (H) 70 - 99 mg/dL    Comment: Glucose reference range applies only to samples taken after fasting for at least 8 hours.  Glucose, capillary     Status: Abnormal   Collection Time: 05/25/21  8:08 PM  Result Value Ref Range   Glucose-Capillary 190 (H) 70 - 99 mg/dL    Comment: Glucose reference range applies only to samples taken after fasting for at least 8 hours.  Glucose, capillary     Status: Abnormal   Collection Time: 05/25/21 11:45 PM  Result Value Ref Range   Glucose-Capillary 180 (H) 70 - 99 mg/dL    Comment: Glucose reference range applies only to samples taken after fasting for at least 8 hours.  Glucose, capillary     Status: Abnormal   Collection Time: 05/26/21  4:13 AM  Result Value Ref Range   Glucose-Capillary 219 (H) 70 - 99 mg/dL    Comment: Glucose reference range applies only to samples taken after fasting for at least 8 hours.  CBC with Differential/Platelet     Status: Abnormal   Collection Time: 05/26/21  5:14 AM  Result Value Ref Range   WBC 20.8 (H) 4.0 - 10.5 K/uL   RBC 2.07 (L) 3.87 - 5.11 MIL/uL   Hemoglobin 5.9 (L) 12.0 - 15.0 g/dL   HCT 19.6 (L) 36.0 - 46.0 %   MCV 94.7 80.0 - 100.0 fL   MCH 28.5 26.0 - 34.0 pg   MCHC 30.1 30.0 - 36.0 g/dL   RDW 22.9 (H) 11.5 - 15.5 %   Platelets 221 150 - 400 K/uL   nRBC 20.7 (H) 0.0 - 0.2 %   Neutrophils Relative % 91 %   Neutro Abs 19.1 (H) 1.7 - 7.7 K/uL   Lymphocytes Relative 0 %   Lymphs Abs 0.0 (L) 0.7 - 4.0 K/uL   Monocytes Relative 7 %   Monocytes Absolute 1.4 (H) 0.1 - 1.0 K/uL   Eosinophils Relative 0 %   Eosinophils Absolute 0.0 0.0 - 0.5 K/uL   Basophils Relative 0 %   Basophils Absolute 0.1 0.0 - 0.1 K/uL   WBC Morphology MILD  LEFT SHIFT (1-5% METAS, OCC MYELO, OCC BANDS)    RBC Morphology MIXED RBC POPULATION    Smear Review Normal platelet morphology    Immature Granulocytes 2 %   Abs Immature Granulocytes 0.31  (H) 0.00 - 0.07 K/uL   Polychromasia PRESENT     Comment: Performed at [facility], [address] [place], [place] [address]  Renal function panel (daily at 0500)     Status: Abnormal   Collection Time: 05/26/21  5:14 AM  Result Value Ref Range   Sodium 132 (L) 135 - 145 mmol/L   Potassium 3.6 3.5 - 5.1 mmol/L   Chloride 98 98 - 111 mmol/L   CO2 25 22 - 32 mmol/L   Glucose, Bld 214 (H) 70 - 99 mg/dL    Comment: Glucose reference range applies only to samples taken after fasting for at least 8 hours.   BUN 51 (H) 8 - 23 mg/dL   Creatinine, Ser 1.47 (H) 0.44 - 1.00 mg/dL   Calcium 9.2 8.9 - 10.3 mg/dL   Phosphorus 2.2 (L) 2.5 - 4.6 mg/dL   Albumin 2.2 (L) 3.5 - 5.0 g/dL   GFR, Estimated 38 (L) >60 mL/min    Comment: (NOTE) Calculated using the CKD-EPI Creatinine Equation (2021)    Anion gap 9 5 - 15    Comment: Performed at [facility], [address] [place], [place] [address]  Magnesium     Status: None   Collection Time: 05/26/21  5:14 AM  Result Value Ref Range   Magnesium 1.9 1.7 - 2.4 mg/dL    Comment: Performed at [facility], [address] [place], [place] [address]  Prepare RBC (crossmatch)     Status: None   Collection Time: 05/26/21  6:00 AM  Result Value Ref Range   Order Confirmation      ORDER PROCESSED BY BLOOD BANK Performed at [facility], [address] [place], [place] [address]   Prepare RBC (crossmatch)     Status: None   Collection Time: 05/26/21  7:00 AM  Result Value Ref Range   Order Confirmation      ORDER PROCESSED BY BLOOD BANK Performed at [facility], [address] [place], [place] [address]   Glucose, capillary     Status: Abnormal   Collection Time: 05/26/21  7:13 AM  Result Value Ref Range   Glucose-Capillary 195 (H) 70 - 99 mg/dL    Comment: Glucose reference range applies only to samples taken after fasting for at least 8 hours.  Glucose, capillary     Status: Abnormal    Collection Time: 05/26/21 11:26 AM  Result Value Ref Range   Glucose-Capillary 195 (H) 70 - 99 mg/dL    Comment: Glucose reference range applies only to samples taken after fasting for at least 8 hours.  Hemoglobin and hematocrit, blood     Status: Abnormal   Collection Time: 05/26/21 12:15 PM  Result Value Ref Range   Hemoglobin 8.0 (L) 12.0 - 15.0 g/dL   HCT 25.8 (L) 36.0 - 46.0 %    Comment: Performed at [facility], [address], [place], [place] [address]  Renal function panel (daily at 1600)     Status: Abnormal   Collection Time: 05/26/21  1:58 PM  Result Value Ref Range   Sodium 132 (L) 135 - 145 mmol/L   Potassium 3.6 3.5 - 5.1 mmol/L   Chloride 99 98 - 111 mmol/L   CO2 25 22 -  32 mmol/L   Glucose, Bld 253 (H) 70 - 99 mg/dL    Comment: Glucose reference range applies only to samples taken after fasting for at least 8 hours.   BUN 48 (H) 8 - 23 mg/dL   Creatinine, Ser 1.32 (H) 0.44 - 1.00 mg/dL   Calcium 9.2 8.9 - 10.3 mg/dL   Phosphorus 2.4 (L) 2.5 - 4.6 mg/dL   Albumin 2.4 (L) 3.5 - 5.0 g/dL   GFR, Estimated 43 (L) >60 mL/min    Comment: (NOTE) Calculated using the CKD-EPI Creatinine Equation (2021)    Anion gap 8 5 - 15    Comment: Performed at [facility], [address] [place], [place] [address]  Glucose, capillary     Status: Abnormal   Collection Time: 05/26/21  3:31 PM  Result Value Ref Range   Glucose-Capillary 261 (H) 70 - 99 mg/dL    Comment: Glucose reference range applies only to samples taken after fasting for at least 8 hours.  Glucose, capillary     Status: Abnormal   Collection Time: 05/26/21  8:05 PM  Result Value Ref Range   Glucose-Capillary 214 (H) 70 - 99 mg/dL    Comment: Glucose reference range applies only to samples taken after fasting for at least 8 hours.  Glucose, capillary     Status: Abnormal   Collection Time: 05/27/21 12:08 AM  Result Value Ref Range   Glucose-Capillary 189 (H) 70 - 99 mg/dL    Comment:  Glucose reference range applies only to samples taken after fasting for at least 8 hours.  Glucose, capillary     Status: Abnormal   Collection Time: 05/27/21  4:16 AM  Result Value Ref Range   Glucose-Capillary 189 (H) 70 - 99 mg/dL    Comment: Glucose reference range applies only to samples taken after fasting for at least 8 hours.  CBC with Differential/Platelet     Status: Abnormal   Collection Time: 05/27/21  4:55 AM  Result Value Ref Range   WBC 18.4 (H) 4.0 - 10.5 K/uL   RBC 2.56 (L) 3.87 - 5.11 MIL/uL   Hemoglobin 7.5 (L) 12.0 - 15.0 g/dL   HCT 24.2 (L) 36.0 - 46.0 %   MCV 94.5 80.0 - 100.0 fL   MCH 29.3 26.0 - 34.0 pg   MCHC 31.0 30.0 - 36.0 g/dL   RDW 21.1 (H) 11.5 - 15.5 %   Platelets 148 (L) 150 - 400 K/uL   nRBC 10.6 (H) 0.0 - 0.2 %   Neutrophils Relative % 90 %   Neutro Abs 16.5 (H) 1.7 - 7.7 K/uL   Lymphocytes Relative 2 %   Lymphs Abs 0.4 (L) 0.7 - 4.0 K/uL   Monocytes Relative 6 %   Monocytes Absolute 1.1 (H) 0.1 - 1.0 K/uL   Eosinophils Relative 0 %   Eosinophils Absolute 0.1 0.0 - 0.5 K/uL   Basophils Relative 0 %   Basophils Absolute 0.1 0.0 - 0.1 K/uL   WBC Morphology INCREASED BANDS (>20% BANDS)     Comment: TOXIC GRANULATION VACUOLATED NEUTROPHILS    RBC Morphology See Note     Comment: MIXED RBC POPULATION   Smear Review Normal platelet morphology    Immature Granulocytes 2 %   Abs Immature Granulocytes 0.27 (H) 0.00 - 0.07 K/uL   Polychromasia PRESENT    Target Cells PRESENT     Comment: Performed at [facility], [address] [place], [place] [address]  Renal function panel (daily at  0500)     Status: Abnormal   Collection Time: 05/27/21  4:55 AM  Result Value Ref Range   Sodium 136 135 - 145 mmol/L   Potassium 3.6 3.5 - 5.1 mmol/L   Chloride 99 98 - 111 mmol/L   CO2 26 22 - 32 mmol/L   Glucose, Bld 256 (H) 70 - 99 mg/dL    Comment: Glucose reference range applies only to samples taken after fasting for at least 8 hours.   BUN  47 (H) 8 - 23 mg/dL   Creatinine, Ser 1.36 (H) 0.44 - 1.00 mg/dL   Calcium 9.3 8.9 - 10.3 mg/dL   Phosphorus 2.3 (L) 2.5 - 4.6 mg/dL   Albumin 2.3 (L) 3.5 - 5.0 g/dL   GFR, Estimated 41 (L) >60 mL/min    Comment: (NOTE) Calculated using the CKD-EPI Creatinine Equation (2021)    Anion gap 11 5 - 15    Comment: Performed at [facility], [address] [place], [place] [address]  Magnesium     Status: None   Collection Time: 05/27/21  4:55 AM  Result Value Ref Range   Magnesium 2.0 1.7 - 2.4 mg/dL    Comment: Performed at [facility], [address] [place], [place] [address]  Glucose, capillary     Status: Abnormal   Collection Time: 05/27/21  7:16 AM  Result Value Ref Range   Glucose-Capillary 270 (H) 70 - 99 mg/dL    Comment: Glucose reference range applies only to samples taken after fasting for at least 8 hours.  Glucose, capillary     Status: Abnormal   Collection Time: 05/27/21 11:17 AM  Result Value Ref Range   Glucose-Capillary 200 (H) 70 - 99 mg/dL    Comment: Glucose reference range applies only to samples taken after fasting for at least 8 hours.  Glucose, capillary     Status: Abnormal   Collection Time: 05/27/21  3:12 PM  Result Value Ref Range   Glucose-Capillary 178 (H) 70 - 99 mg/dL    Comment: Glucose reference range applies only to samples taken after fasting for at least 8 hours.  Renal function panel (daily at 1600)     Status: Abnormal   Collection Time: 05/27/21  3:51 PM  Result Value Ref Range   Sodium 134 (L) 135 - 145 mmol/L   Potassium 3.6 3.5 - 5.1 mmol/L   Chloride 101 98 - 111 mmol/L   CO2 25 22 - 32 mmol/L   Glucose, Bld 198 (H) 70 - 99 mg/dL    Comment: Glucose reference range applies only to samples taken after fasting for at least 8 hours.   BUN 57 (H) 8 - 23 mg/dL   Creatinine, Ser 1.34 (H) 0.44 - 1.00 mg/dL   Calcium 9.1 8.9 - 10.3 mg/dL   Phosphorus 3.7 2.5 - 4.6 mg/dL   Albumin 2.4 (L) 3.5 - 5.0 g/dL   GFR,  Estimated 42 (L) >60 mL/min    Comment: (NOTE) Calculated using the CKD-EPI Creatinine Equation (2021)    Anion gap 8 5 - 15    Comment: Performed at [facility], [address] [place], [place] [address]  . No results found..   ASSESSMENT: Trach leak PLAN: Traded out standard trach for a #8 cuffed distal XLT without difficulty. Wound cleaner. Hopefully will granulate better with distal XLT and better seal.    [name], MD 05/27/2021 4:48 PM

## 2021-05-27 NOTE — Progress Notes (Signed)
Inpatient Diabetes Program Recommendations  AACE/ADA: New Consensus Statement on Inpatient Glycemic Control (2015)  Target Ranges:  Prepandial:   less than 140 mg/dL      Peak postprandial:   less than 180 mg/dL (1-2 hours)      Critically ill patients:  140 - 180 mg/dL    Latest Reference Range & Units 05/25/21 23:45 05/26/21 04:13 05/26/21 07:13 05/26/21 11:26 05/26/21 15:31 05/26/21 20:05  Glucose-Capillary 70 - 99 mg/dL 180 (H)  8 units Novolog  219 (H)  11 units Novolog  195 (H)  8 units Novolog  17 units Levemir @0914   195 (H)  8 units Novolog  261 (H)  15 units Novolog  214 (H)  11 units Novolog  17 units Levemir @2230      Latest Reference Range & Units 05/27/21 00:08 05/27/21 04:16  Glucose-Capillary 70 - 99 mg/dL 189 (H)  8 units Novolog  189 (H)  8 units Novolog       Current Orders: Levemir 17 units BID Novolog 0-20 units Q4H  Novolog 4 units Q4H for Tube feed Coverage    Getting Solucortef 100 mg BID  Tube Feeds running 65cc/hr    MD- Please consider increasing the Novolog tube Feed Coverage to 6 units Q4 hours     --Will follow patient during hospitalization--  Wyn Quaker RN, MSN, CDE Diabetes Coordinator Inpatient Glycemic Control Team Team Pager: [phone] (8a-5p)

## 2021-05-27 NOTE — Progress Notes (Signed)
I  Date of Admission:  05/04/2021     ID: Alexandra Foster is a 71 y.o. female  Principal Problem:   Severe sepsis with septic shock (CODE) ([place]) Active Problems:   COPD (chronic obstructive pulmonary disease) (HCC)   Morbid obesity ([place])   Diabetes mellitus type 2 in obese ([place])   Atrial fibrillation and flutter (HCC)   Necrotizing fasciitis ([place])   Acute on chronic respiratory failure ([place])   On mechanically assisted ventilation ([place])   Shock liver   Anemia associated with acute blood loss   Metabolic acidosis   Encounter for continuous renal replacement therapy (CRRT) for acute renal failure (HCC)   Acute encephalopathy   Group A streptococcal infection   Streptococcal toxic shock syndrome ([place])   Status post tracheostomy ([place])   Subdural hemorrhage (HCC)   Left leg cellulitis    Subjective: ROS not available  Medications:   sodium chloride   Intravenous Once   sodium chloride   Intravenous Once   amiodarone  200 mg Per Tube Daily   vitamin C  500 mg Per Tube BID   chlorhexidine gluconate (MEDLINE KIT)  15 mL Mouth Rinse BID   Chlorhexidine Gluconate Cloth  6 each Topical Q0600   Darbepoetin Alfa  100 mcg Subcutaneous Q7 days   feeding supplement (PROSource TF)  90 mL Per Tube BID   free water  30 mL Per Tube Q4H   gentamicin irrigation  80 mg Irrigation QID   hydrocortisone sod succinate (SOLU-CORTEF) inj  100 mg Intravenous Q12H   insulin aspart  0-20 Units Subcutaneous Q4H   insulin aspart  4 Units Subcutaneous Q4H   insulin detemir  17 Units Subcutaneous BID   magic mouthwash w/lidocaine  10 mL Oral QID   mouth rinse  15 mL Mouth Rinse 10 times per day   midodrine  10 mg Per Tube TID WC   multivitamin  1 tablet Per Tube QHS   nutrition supplement (JUVEN)  1 packet Per Tube BID BM   pantoprazole (PROTONIX) IV  40 mg Intravenous Q24H   sodium chloride flush  10-40 mL Intracatheter Q12H   [START ON 05/28/2021] vitamin A  10,000 Units Per Tube Daily   [START  ON 05/28/2021] zinc sulfate  220 mg Per Tube Daily    Objective: Vital signs in last 24 hours: Temp:  [98.3 F (36.8 C)-99 F (37.2 C)] 99 F (37.2 C) (02/13 1215) Pulse Rate:  [63-87] 75 (02/13 1415) Resp:  [13-23] 18 (02/13 1415) BP: (106-135)/(43-70) 106/49 (02/13 1415) SpO2:  [95 %-100 %] 100 % (02/13 1415) FiO2 (%):  [40 %] 40 % (02/13 1415) Weight:  [111.5 kg] 111.5 kg (02/13 0500)  PHYSICAL EXAM:  General:awake trachesotomy Head: Normocephalic, without obvious abnormality, atraumatic. Eyes: Conjunctivae clear, anicteric sclerae. Pupils are equal ENT t- tongue tip looks ischemic with eschar B/l IJ lines On CRRT Lungs: Bilateral air entry  heart: Regular rate and rhythm, no murmur, rub or gallop. Abdomen: Soft, non-tender,not distended. Bowel sounds normal. No masses Extremities: Left leg has wound VAC Picture reviewed        Lymph: Cervical, supraclavicular normal. Neurologic: Cannot be assessed NG tube on tube feeds Foley catheter out anuria   Lab Results Recent Labs    05/26/21 0514 05/26/21 1215 05/26/21 1358 05/27/21 0455  WBC 20.8*  --   --  18.4*  HGB 5.9* 8.0*  --  7.5*  HCT 19.6* 25.8*  --  24.2*  NA 132*  --  132* 136  K 3.6  --  3.6 3.6  CL 98  --  99 99  CO2 25  --  25 26  BUN 51*  --  48* 47*  CREATININE 1.47*  --  1.32* 1.36*   Liver Panel Recent Labs    05/26/21 1358 05/27/21 0455  ALBUMIN 2.4* 2.3*   Microbiology: [facility] 05/04/21- GAS 05/07/21- Wound culture NG 05/09/21 BC -NG Neck wound/tracheal aspiration with Pseudomonas Studies/Results:   No results found.   Assessment/Plan:  Group A streptococcus bacteremia with invasive infection with septic shock, multiorgan failure.  Necrotizing infection of the left leg.  Status post debridement.  Has wound VAC Patient completed 15 days of appropriate antibiotics   She also received 3 doses of IVIG.  Leukocytosis.   Fluctuating- can be a stress response to the severe anemia she  had over the weekend  Anemia received PRBC  -2  units over weekend  Pseudomonas in culture from the tracheal wound aspirate which could be respiratory secretions coating the tracheostomy  B/l lung infiltrates questioning pneumonia Continue zosyn- end date 05/31/21  Encephalopathy: Improved Seizures on Keppra Small subdural hemorrhage.  Normal MRA  AKI on CRRT  Acute hypoxic respiratory failure.  Tracheostomy and is on the vent  Shock liver with transaminitis improving  Anemia due to infection and blood loss from the left leg.  Has received multiple units of PRBC  A-fib now in sinus rhythm.  On amiodarone.    Discussed management with the care team.

## 2021-05-27 NOTE — Consult Note (Signed)
PHARMACY CONSULT NOTE - FOLLOW UP  Pharmacy Consult for Electrolyte Monitoring and Replacement   Recent Labs: Potassium (mmol/L)  Date Value  05/27/2021 3.6  08/11/2013 3.3 (L)   Magnesium (mg/dL)  Date Value  05/27/2021 2.0   Calcium (mg/dL)  Date Value  05/27/2021 9.3   Calcium, Total (mg/dL)  Date Value  08/11/2013 9.2   Albumin (g/dL)  Date Value  05/27/2021 2.3 (L)  08/11/2013 3.1 (L)   Phosphorus (mg/dL)  Date Value  05/27/2021 2.3 (L)   Sodium (mmol/L)  Date Value  05/27/2021 136  08/11/2013 138   Assessment: 71 yo female with a previous medical history of diabetes mellitus type 2, hypertension, obstructive sleep apnea on CPAP, asthma/COPD, GERD, history of COVID-19 pneumonia presenting with lower left extremity pain and chills. Patient found to have severe sepsis and strep pyogenes bacteremia on admission and is currently intubated and sedated. LLL necrotizing fascitis. Small [facility]  Pharmacy has been consulted to monitor and replace electrolytes.  -CRRT stopped 2/6, re-started (2/8>> -s/p trach 2/3, tolerating trach collar trials  Sodium: 132>132>136 Potassium: 3.6>3.6 Phos: 2.3>2.4>2.3 (NPO; on TF's)  Nutrition:  --Feeds per tube @65ml /hr --Free water per tube 30 mL Q4H  Goal of Therapy:  Electrolytes within normal limits  Plan:  Continues on CRRT; prismasol 2/2.5. Phos consistently low despite tube feeds will replete modestly. Sodium corrected today, K stable WNL Replete with IV NaPhos 9mmol x1 dose CTM q12h Renal function panels while on CRRT  Lorna Dibble, PharmD, [facility] Clinical Pharmacist 05/27/2021 8:51 AM

## 2021-05-27 NOTE — Progress Notes (Signed)
Patient alert spontaneously and to voice stimulation throughout shift. Able to follow commands. Would not let staff perform mouthcare effectively during shift, despite bleeding obvious sores in patient's mouth. Sometimes patient would let RN swab lips but most often she would push down staff's hands when they attempted to perform mouthcare, move her head away, and keep her mouth as closed as possible during attempts. Explained how important mouthcare was and tried different types of solutions on the swabs (mouth rinse, water, the magic mouthwash with lidocaine, just an ice chip held to lips) with no change. Was able to get small amounts of magic mouthwash with lidocaine in patient's mouth on the swabs but not much.

## 2021-05-27 NOTE — Progress Notes (Signed)
°   05/27/21 1000  Clinical Encounter Type  Visited With Patient and family together  Visit Type Follow-up;Social support  Stress Factors  Family Stress Factors Major life changes   Chaplain visited with patient and family members providing support through compassionate presence and conversation. Chaplain also noticed on chart to encourage upper limb movement and so provided verbal directives to encourage movement like waving to say hi.

## 2021-05-27 NOTE — Progress Notes (Signed)
NAME:  Alexandra Foster, MRN:  [id number], DOB:  11/21/1950, LOS: 13 ADMISSION DATE:  05/04/2021  History of present Illness:  71 y.o female with medical history significant of Asthma/COPD, T2DM, HTN, OSA on CPAP, GERD, COVID-19 pneumonia, and Bilateral lower extremity edema who presented to the ED with chief complaint of LLE pain and chills since Thursday.  Patient was given 30 cc/kg of fluids and started on broad-spectrum antibiotics for sepsis with septic shock.   Admitted to the ICU with severe sepsis with shock secondary to cellulitis of LLE septic shock with LLL necrotizing fascitis post op resp failure and severe septic shock with progressive renal failure on CRRT and progressive multiorgan failure  MRI/CT SHOWS SMALL [facility] Events   05/04/21: Admitted to the ICU with severe sepsis with shock secondary to cellulitis of LLE 1/23 off pressors +Afib with RVR 1/24 blood pressure low  1/24 to OR for fasciotomy 1/25 post op resp failure with severe septic shock 1/26 remains on pressors 1/27 returns to the OR today for debridement, wound VAC 1/28 overnight with hemodynamic deterioration, required reintubation, currently on pressors 1/29 remains on 3 pressors, CRRT, wound VAC and minimal sedation.  Awakens to voice and touch.  Seen by vascular surgery and awaiting input from general surgery regarding amputation of left leg 1/30 remains on vent remains on pressors 1/31 remains on pressors, on vent, on CRRT 2/1 remains on pressors, on CRRT, MRI/CT shows SAH, afib with RVR, ECHO pending, restarted AMIO at 33 2/2 remains on vent plan for trach tomorrow 2/3 remains on crrt, remains on vent, plan for [facility] and wound vac change in OR 2/3 S/P TRACH,  Implantation of cellular matrix on left leg 2/4 REMAINS ON PRESSORS, REMAINS ON VENT, REMAINS ON CRRT 2/5 REMAINS ON VENT, OFF PRESSORS, REMAINS ON CRRT 05/20/21- Patient is improved she is able to follow communication verbally  and move extermities. She is on 5L/min Trache collar. We have stopped levophed and working on reducing vasopressin. She remains on CRRT.  Insulin infusion is ongoing and plan to have weight based regimen initiated. Additionally she will have SLP for vocalization and PMV trial as well as ROM training with OT/PT.  Multiple specialists on case appreciate everyone involved.   05/21/21-patient is improved mildly , she's off CRRT, shes off insulin drip. She is mentating.  She was evaluated by [facility].  She was unable to vocalize with PMV.  She may need PEG tube for nourishment.   05/22/21- patient is overnight worsened with fever and increased levophed dose.  She is negative 1.7L.   She is on zosyn.   05/23/21- patient is improved this am. She is giving thumbs up during examination. 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 She seems volume depleted as well so we will start CVP monitoring and give IV LR 1L bolus today to check for volume responsiveness. Albumin also while on CRRT. PT/OT starting today. Type and screen repeat due to plan for OR in am. On zosyn with ID following. Starting [place] heparin today.   05/24/21- patient is s/p repeat surgery on RLE. She is on levophed 85mg post surgery. She is not tachycardic and able to follow some verbal communication.    05/25/21- patient stable overnight. Reordering PT/OT today. Wbc count slightly trending up remains on abx probably post surgical.  Remains severely anemic on CRRT and vasopressor support with tracheostomy.   05/26/21- patient  with severe anemia today.  Reviewed with renal team for blood transfusion today.   2/13 remains critically ill, on pressors and CRRT  Significant Diagnostic Tests:  1/21: Chest Xray> no active cardiopulmonary process 1/21: CT left lower leg>Diffuse subcutaneous edema may represent cellulitis. No drainable fluid collection/abscess 1/21:  Ultrasound lower unilateral left> no DVT   Micro Data:  1/21: SARS-CoV-2 PCR> negative 1/21: Influenza PCR> negative 1/21: Blood culture x2> Streptococcus pyogenes 1/21: Urine Culture> negative 1/21: MRSA PCR>> negative 2/7 PSEUDOMONAS INFECTION TRACH SITE   Antimicrobials:  Vancomycin 1/21> off Cefepime 1/21> off Ceftriaxone 1/21 >> off Penicilli G 1/24>> Linezolid 1/25>>  *Antimicrobials per ID- zosyn now,  Gentamycin Gauze to [facility] site  Objective   Blood pressure (!) 118/54, pulse 74, temperature 98.8 F (37.1 C), temperature source Axillary, resp. rate 16, height '5\' 5"'  (1.651 m), weight 111.5 kg, SpO2 96 %.    Vent Mode: PSV FiO2 (%):  [40 %] 40 % PEEP:  [5 cmH20] 5 cmH20 Pressure Support:  [5 cmH20] 5 cmH20   Intake/Output Summary (Last 24 hours) at 05/27/2021 0735 Last data filed at 05/27/2021 0700 Gross per 24 hour  Intake 2657.71 ml  Output 2704 ml  Net -46.29 ml    Filed Weights   05/24/21 1445 05/25/21 0500 05/27/21 0500  Weight: 119.7 kg 111.8 kg 111.5 kg    REVIEW OF SYSTEMS  PATIENT IS UNABLE TO PROVIDE COMPLETE REVIEW OF SYSTEMS DUE TOCRITICAL ILLNESS    PHYSICAL EXAMINATION:  GENERAL:critically ill appearing, +resp distress EYES: Pupils equal, round, reactive to light.  No scleral icterus.  MOUTH: Moist mucosal membrane. s/p trach PULMONARY: Mild rhonchi bilaterally  CARDIOVASCULAR: S1,S2 no murmurs GASTROINTESTINAL: Soft, nontender, -distended.  MUSCULOSKELETAL: No swelling, clubbing, or edema.  NEUROLOGIC: GCS8T MUSCULOSKELETAL: Left lower extremity with wound VAC in place, surgery redressing.  Status post fasciotomy     Assessment & Plan   71 yo morbidly AAF Admitted to the ICU with severe sepsis with shock secondary to cellulitis of LLE septic shock with LLL necrotizing fascitis post op resp failure and severe septic shock with progressive renal failure on CRRT and progressive multiorgan failure  Severe ACUTE Hypoxic and Hypercapnic  Respiratory Failure -continue Mechanical Ventilator support -Wean Fio2 and PEEP as tolerated -VAP/VENT bundle implementation - Wean PEEP & FiO2 as tolerated, maintain SpO2 > 88% - Head of bed elevated 30 degrees, VAP protocol in place - Plateau pressures less than 30 cm H20  - Intermittent chest x-ray & ABG PRN - Ensure adequate pulmonary hygiene  -will perform SAT/SBT when respiratory parameters are met TCT as tolerated  SEPTIC shock SOURCE-Refractory Septic Shock secondary to LLE necrotizing fasciitis Streptococcal toxic shock syndrome LLE POA and PSEUDOMONAS INFECTION -use vasopressors to keep MAP>65 as needed -follow ABG and LA as needed -follow up cultures -continue antibiotics as prescribed-zyvox and rocephin IV - Status postdebridement, wound VAC per surgery -Vascular and general surgery following S/p IVIG    ACUTE KIDNEY INJURY/Renal Failure -continue Foley Catheter-assess need -Avoid nephrotoxic agents -Follow urine output, BMP -Ensure adequate renal perfusion, optimize oxygenation -Renal dose medications Remains on CRRT   Intake/Output Summary (Last 24 hours) at 05/27/2021 0741 Last data filed at 05/27/2021 0700 Gross per 24 hour  Intake 2657.71 ml  Output 2704 ml  Net -46.29 ml   SHOCK LIVER -imporved     - S/P supportive care for sepsis with improved liver enzymes and resolution of transaminitis     - resolved    Severe electrolyte derrangements     -  pharmacy consultation       -improved   Acute encephalopathy  Toxic metabolic encephalopathy Loaded with keppra; continue at 1gram bid MRI/CT HEAD SHOWS SMALL VOLUME Gillespie APPRECIATE NEUROSURGERY RECS -Neurology consulted NO ANTICOAGULATION FOR  1 MONTH AND THEN NEED TO RE-ASSESS NO ANTIPLATELET AGENTS FOR 1 MONTH AND THEN NEED TO RE-ASSESS CAN POSSIBLY CONSIDER BABY ASA BUT WOULD NEED TO PROCEED WITH CAUTION CAN CONSIDER  DVT PROPHYLAXIS AFTER SURGERY BASED ON RISK OF BLEEDING    ENDO - ICU  hypoglycemic\Hyperglycemia protocol -check FSBS per protocol   GI GI PROPHYLAXIS as indicated  NUTRITIONAL STATUS DIET-->TF's as tolerated Constipation protocol as indicated   ELECTROLYTES -follow labs as needed -replace as needed -pharmacy consultation and following      Best practice:  Diet: NPO Pain/Anxiety/Delirium protocol (if indicated): Yes, initiated VAP protocol (if indicated): Yes, initiated DVT prophylaxis: Subcutaneous Heparin GI prophylaxis: PPI Glucose control: ICU hyperglycemia protocol, insulin drip discontinued; SQ insulin Central venous access:  Rt IJV Arterial line:  N/A Foley:  Yes Mobility:  bed rest  PT consulted: N/A Code Status:  full code Disposition: ICU    Labs   CBC: Recent Labs  Lab 05/23/21 0500 05/24/21 0335 05/24/21 1300 05/25/21 0059 05/25/21 0459 05/26/21 0514 05/26/21 1215 05/27/21 0455  WBC 13.6* 13.9*  --   --  14.9* 20.8*  --  18.4*  NEUTROABS 10.3* 12.0*  --   --  13.7* 19.1*  --  16.5*  HGB 7.4* 6.9*   < > 7.7* 7.4* 5.9* 8.0* 7.5*  HCT 22.2* 21.7*   < > 24.3* 24.0* 19.6* 25.8* 24.2*  MCV 85.1 87.5  --   --  92.7 94.7  --  94.5  PLT 248 268  --   --  292 221  --  148*   < > = values in this interval not displayed.     Basic Metabolic Panel: Recent Labs  Lab 05/23/21 0500 05/23/21 1708 05/24/21 0335 05/24/21 0340 05/25/21 0459 05/25/21 1600 05/26/21 0514 05/26/21 1358 05/27/21 0455  NA 134*   < >  --    < > 135 133* 132* 132* 136  K 3.7   < >  --    < > 4.3 3.5 3.6 3.6 3.6  CL 100   < >  --    < > 100 101 98 99 99  CO2 26   < >  --    < > 21* '23 25 25 26  ' GLUCOSE 180*   < >  --    < > 228* 253* 214* 253* 256*  BUN 52*   < >  --    < > 50* 54* 51* 48* 47*  CREATININE 1.95*   < >  --    < > 1.56* 1.60* 1.47* 1.32* 1.36*  CALCIUM 8.9   < >  --    < > 9.3 8.9 9.2 9.2 9.3  MG 2.2  --  2.1  --  2.1  --  1.9  --  2.0  PHOS 3.2   < >  --    < > 3.0 2.2* 2.2* 2.4* 2.3*   < > = values in this interval not  displayed.    GFR: Estimated Creatinine Clearance: 46.5 mL/min (A) (by C-G formula based on SCr of 1.36 mg/dL (H)). Recent Labs  Lab 05/24/21 0335 05/25/21 0459 05/26/21 0514 05/27/21 0455  WBC 13.9* 14.9* 20.8* 18.4*     Liver Function Tests: Recent Labs  Lab  05/22/21 1104 05/22/21 1615 05/25/21 0459 05/25/21 1600 05/26/21 0514 05/26/21 1358 05/27/21 0455  AST 102*  --   --   --   --   --   --   ALT 138*  --   --   --   --   --   --   ALKPHOS 323*  --   --   --   --   --   --   BILITOT 2.8*  --   --   --   --   --   --   PROT 6.3*  --   --   --   --   --   --   ALBUMIN 2.4*   < > 2.3* 2.3* 2.2* 2.4* 2.3*   < > = values in this interval not displayed.    Coagulation Profile: Recent Labs  Lab 05/22/21 0526 05/24/21 0335  INR 1.2 1.1     Cardiac Enzymes: No results for input(s): CKTOTAL, CKMB, CKMBINDEX, TROPONINI in the last 168 hours.   HbA1C: Hgb A1c MFr Bld  Date/Time Value Ref Range Status  05/05/2021 04:53 AM 6.7 (H) 4.8 - 5.6 % Final    Comment:    (NOTE)         Prediabetes: 5.7 - 6.4         Diabetes: >6.4         Glycemic control for adults with diabetes: <7.0   02/11/2021 09:41 AM 6.3 (H) <5.7 % of total Hgb Final    Comment:    For someone without known diabetes, a hemoglobin  A1c value between 5.7% and 6.4% is consistent with prediabetes and should be confirmed with a  follow-up test. . For someone with known diabetes, a value <7% indicates that their diabetes is well controlled. A1c targets should be individualized based on duration of diabetes, age, comorbid conditions, and other considerations. . This assay result is consistent with an increased risk of diabetes. . Currently, no consensus exists regarding use of hemoglobin A1c for diagnosis of diabetes for children. .     CBG: Recent Labs  Lab 05/26/21 1531 05/26/21 2005 05/27/21 0008 05/27/21 0416 05/27/21 0716  GLUCAP 261* 214* 189* 189* 270*    No Known  Allergies   Medications  Scheduled Meds:  sodium chloride   Intravenous Once   sodium chloride   Intravenous Once   amiodarone  200 mg Per Tube Daily   chlorhexidine gluconate (MEDLINE KIT)  15 mL Mouth Rinse BID   Chlorhexidine Gluconate Cloth  6 each Topical Q0600   free water  30 mL Per Tube Q4H   gentamicin irrigation  80 mg Irrigation QID   hydrocortisone sod succinate (SOLU-CORTEF) inj  100 mg Intravenous Q12H   insulin aspart  0-20 Units Subcutaneous Q4H   insulin aspart  4 Units Subcutaneous Q4H   insulin detemir  17 Units Subcutaneous BID   magic mouthwash w/lidocaine  10 mL Oral QID   mouth rinse  15 mL Mouth Rinse 10 times per day   midodrine  10 mg Per Tube TID WC   multivitamin  1 tablet Per Tube QHS   nutrition supplement (JUVEN)  1 packet Per Tube BID BM   pantoprazole (PROTONIX) IV  40 mg Intravenous Q24H   povidone-Iodine   Topical Daily   sodium chloride flush  10-40 mL Intracatheter Q12H   Continuous Infusions:  sodium chloride 250 mL (05/24/21 0703)   albumin human 60 mL/hr at 05/26/21 1400  feeding supplement (PIVOT 1.5 CAL) 1,000 mL (05/27/21 0216)   norepinephrine (LEVOPHED) Adult infusion 2 mcg/min (05/27/21 0700)   piperacillin-tazobactam (ZOSYN)  IV Stopped (05/27/21 5072)   prismasol BGK 2/2.5 dialysis solution 1,000 mL/hr at 05/27/21 0302   prismasol BGK 2/2.5 replacement solution 400 mL/hr at 05/26/21 1049   prismasol BGK 2/2.5 replacement solution 400 mL/hr at 05/26/21 1048   vasopressin Stopped (05/24/21 2223)   PRN Meds:.acetaminophen, artificial tears, docusate, fentaNYL (SUBLIMAZE) injection, heparin, lip balm, midazolam, ondansetron (ZOFRAN) IV, oxyCODONE, sennosides, sodium chloride, sodium chloride flush    DVT/GI PRX  assessed I Assessed the need for Labs I Assessed the need for Foley I Assessed the need for Central Venous Line Family Discussion when available I Assessed the need for Mobilization I made an Assessment of medications  to be adjusted accordingly Safety Risk assessment completed  CASE DISCUSSED IN MULTIDISCIPLINARY ROUNDS WITH ICU TEAM     Critical Care Time devoted to patient care services described in this note is 55 minutes.  Critical care was necessary to treat /prevent imminent and life-threatening deterioration. Overall, patient is critically ill, prognosis is guarded.  Patient with Multiorgan failure and at high risk for cardiac arrest and death.    Corrin Parker, M.D.  [facility]  Medical Director [place] Director [facility] Cardio-Pulmonary Department

## 2021-05-28 ENCOUNTER — Inpatient Hospital Stay: Admission: RE | Admit: 2021-05-28 | Payer: Medicare HMO | Source: Home / Self Care | Admitting: Surgery

## 2021-05-28 ENCOUNTER — Encounter: Payer: Self-pay | Admitting: Certified Registered Nurse Anesthetist

## 2021-05-28 ENCOUNTER — Encounter
Admission: EM | Disposition: A | Discharge status: Nursing Home (Includes ICF) | Payer: Self-pay | Source: Home / Self Care | Attending: Internal Medicine

## 2021-05-28 DIAGNOSIS — A419 Sepsis, unspecified organism: Secondary | ICD-10-CM | POA: Diagnosis not present

## 2021-05-28 DIAGNOSIS — A491 Streptococcal infection, unspecified site: Secondary | ICD-10-CM | POA: Diagnosis not present

## 2021-05-28 DIAGNOSIS — I4891 Unspecified atrial fibrillation: Secondary | ICD-10-CM | POA: Diagnosis not present

## 2021-05-28 DIAGNOSIS — D62 Acute posthemorrhagic anemia: Secondary | ICD-10-CM | POA: Diagnosis not present

## 2021-05-28 DIAGNOSIS — R6521 Severe sepsis with septic shock: Secondary | ICD-10-CM | POA: Diagnosis not present

## 2021-05-28 HISTORY — PX: PEG PLACEMENT: SHX5437

## 2021-05-28 LAB — RENAL FUNCTION PANEL
Albumin: 2.9 g/dL — ABNORMAL LOW (ref 3.5–5.0)
Albumin: 3.1 g/dL — ABNORMAL LOW (ref 3.5–5.0)
Anion gap: 7 (ref 5–15)
Anion gap: 9 (ref 5–15)
BUN: 54 mg/dL — ABNORMAL HIGH (ref 8–23)
BUN: 43 mg/dL — ABNORMAL HIGH (ref 8–23)
CO2: 25 mmol/L (ref 22–32)
CO2: 25 mmol/L (ref 22–32)
Calcium: 9 mg/dL (ref 8.9–10.3)
Calcium: 9.2 mg/dL (ref 8.9–10.3)
Chloride: 104 mmol/L (ref 98–111)
Chloride: 102 mmol/L (ref 98–111)
Creatinine, Ser: 1.34 mg/dL — ABNORMAL HIGH (ref 0.44–1.00)
Creatinine, Ser: 1.09 mg/dL — ABNORMAL HIGH (ref 0.44–1.00)
GFR, Estimated: 42 mL/min — ABNORMAL LOW (ref >60)
GFR, Estimated: 54 mL/min — ABNORMAL LOW (ref >60)
Glucose, Bld: 135 mg/dL — ABNORMAL HIGH (ref 70–99)
Glucose, Bld: 131 mg/dL — ABNORMAL HIGH (ref 70–99)
Phosphorus: 3.3 mg/dL (ref 2.5–4.6)
Phosphorus: 3.8 mg/dL (ref 2.5–4.6)
Potassium: 3.8 mmol/L (ref 3.5–5.1)
Potassium: 3.8 mmol/L (ref 3.5–5.1)
Sodium: 136 mmol/L (ref 135–145)
Sodium: 136 mmol/L (ref 135–145)

## 2021-05-28 LAB — GLUCOSE, CAPILLARY
Glucose-Capillary: 103 mg/dL — ABNORMAL HIGH (ref 70–99)
Glucose-Capillary: 105 mg/dL — ABNORMAL HIGH (ref 70–99)
Glucose-Capillary: 112 mg/dL — ABNORMAL HIGH (ref 70–99)
Glucose-Capillary: 122 mg/dL — ABNORMAL HIGH (ref 70–99)
Glucose-Capillary: 125 mg/dL — ABNORMAL HIGH (ref 70–99)
Glucose-Capillary: 130 mg/dL — ABNORMAL HIGH (ref 70–99)

## 2021-05-28 LAB — CBC WITH DIFFERENTIAL/PLATELET
Abs Immature Granulocytes: 0.13 10*3/uL — ABNORMAL HIGH (ref 0.00–0.07)
Basophils Absolute: 0 10*3/uL (ref 0.0–0.1)
Basophils Relative: 0 %
Eosinophils Absolute: 0 10*3/uL (ref 0.0–0.5)
Eosinophils Relative: 0 %
HCT: 22.1 % — ABNORMAL LOW (ref 36.0–46.0)
Hemoglobin: 6.8 g/dL — ABNORMAL LOW (ref 12.0–15.0)
Immature Granulocytes: 1 %
Lymphocytes Relative: 3 %
Lymphs Abs: 0.5 10*3/uL — ABNORMAL LOW (ref 0.7–4.0)
MCH: 30 pg (ref 26.0–34.0)
MCHC: 30.8 g/dL (ref 30.0–36.0)
MCV: 97.4 fL (ref 80.0–100.0)
Monocytes Absolute: 1 10*3/uL (ref 0.1–1.0)
Monocytes Relative: 7 %
Neutro Abs: 12.2 10*3/uL — ABNORMAL HIGH (ref 1.7–7.7)
Neutrophils Relative %: 89 %
Platelets: 183 10*3/uL (ref 150–400)
RBC: 2.27 MIL/uL — ABNORMAL LOW (ref 3.87–5.11)
RDW: 21.2 % — ABNORMAL HIGH (ref 11.5–15.5)
WBC: 13.7 10*3/uL — ABNORMAL HIGH (ref 4.0–10.5)
nRBC: 6.2 % — ABNORMAL HIGH (ref 0.0–0.2)

## 2021-05-28 LAB — MAGNESIUM: Magnesium: 2.2 mg/dL (ref 1.7–2.4)

## 2021-05-28 LAB — HEMOGLOBIN AND HEMATOCRIT, BLOOD
HCT: 24.8 % — ABNORMAL LOW (ref 36.0–46.0)
Hemoglobin: 7.6 g/dL — ABNORMAL LOW (ref 12.0–15.0)

## 2021-05-28 LAB — PROTIME-INR
INR: 1.2 (ref 0.8–1.2)
Prothrombin Time: 14.9 seconds (ref 11.4–15.2)

## 2021-05-28 LAB — PREPARE RBC (CROSSMATCH)

## 2021-05-28 SURGERY — INSERTION, PEG TUBE
Anesthesia: General

## 2021-05-28 MED ORDER — SODIUM CHLORIDE 0.9% IV SOLUTION: Freq: Once | INTRAVENOUS | Status: AC

## 2021-05-28 MED ORDER — SODIUM CHLORIDE 0.9 % IV SOLN
Freq: Once | INTRAVENOUS | Status: AC
  Filled 2021-05-28: qty 2

## 2021-05-28 MED ORDER — PROPOFOL 1000 MG/100ML IV EMUL
5.0000 ug/kg/min | INTRAVENOUS | Status: DC
  Administered 2021-05-28: 5 ug/kg/min via INTRAVENOUS
  Filled 2021-05-28: qty 100

## 2021-05-28 MED ORDER — FENTANYL CITRATE PF 50 MCG/ML IJ SOSY
50.0000 ug | PREFILLED_SYRINGE | INTRAMUSCULAR | Status: DC | PRN
  Administered 2021-05-28: 100 ug via INTRAVENOUS
  Filled 2021-05-28: qty 2

## 2021-05-28 MED ORDER — ASCORBIC ACID 500 MG/ML IJ SOLN
1000.0000 mg | Freq: Once | INTRAMUSCULAR | Status: DC
  Filled 2021-05-28: qty 2

## 2021-05-28 NOTE — Progress Notes (Signed)
PT Cancellation Note  Patient Details Name: Alexandra Foster MRN: [id number] DOB: 1951/03/01   Cancelled Treatment:     Pt noted with Hgb 6.8 today, currently getting blood transfusion. Will continue PT services per POC next available date/time.    Josie Dixon 05/28/2021, 11:59 AM

## 2021-05-28 NOTE — Consult Note (Signed)
PHARMACY CONSULT NOTE  Pharmacy Consult for Electrolyte Monitoring and Replacement   Recent Labs: Potassium (mmol/L)  Date Value  05/28/2021 3.8  08/11/2013 3.3 (L)   Magnesium (mg/dL)  Date Value  05/28/2021 2.2   Calcium (mg/dL)  Date Value  05/28/2021 9.0   Calcium, Total (mg/dL)  Date Value  08/11/2013 9.2   Albumin (g/dL)  Date Value  05/28/2021 2.9 (L)  08/11/2013 3.1 (L)   Phosphorus (mg/dL)  Date Value  05/28/2021 3.3   Sodium (mmol/L)  Date Value  05/28/2021 136  08/11/2013 138   Assessment: 71 yo female with a previous medical history of diabetes mellitus type 2, hypertension, obstructive sleep apnea on CPAP, asthma/COPD, GERD, history of COVID-19 pneumonia presenting with lower left extremity pain and chills. Patient found to have severe sepsis and strep pyogenes bacteremia on admission and is currently intubated and sedated. LLL necrotizing fascitis. [facility]  Pharmacy has been consulted to monitor and replace electrolytes.  -CRRT stopped 2/6, re-started (2/8>> -s/p trach 2/3, tolerating trach collar trials  Nutrition:  --Feeds per tube @65ml /hr --Free water per tube 30 mL Q4H  Goal of Therapy:  Electrolytes within normal limits  Plan:  Continues on CRRT; prismasol 2/2.5.  No electrolyte replacement warranted for today q12h Renal function panels while on CRRT  Vallery Sa, PharmD, [facility] Clinical Pharmacist 05/28/2021 6:57 AM

## 2021-05-28 NOTE — Progress Notes (Signed)
I  Date of Admission:  05/04/2021     ID: Alexandra Foster is a 71 y.o. female  Principal Problem:   Severe sepsis with septic shock (CODE) ([place]) Active Problems:   COPD (chronic obstructive pulmonary disease) (HCC)   Morbid obesity ([place])   Diabetes mellitus type 2 in obese ([place])   Atrial fibrillation and flutter ([place])   Necrotizing fasciitis ([place])   Acute on chronic respiratory failure ([place])   On mechanically assisted ventilation ([place])   Shock liver   Anemia associated with acute blood loss   Metabolic acidosis   Encounter for continuous renal replacement therapy (CRRT) for acute renal failure (HCC)   Acute encephalopathy   Group A streptococcal infection   Streptococcal toxic shock syndrome ([place])   Status post tracheostomy ([place])   Subdural hemorrhage (HCC)   Left leg cellulitis    Subjective: ROS not available Underwent peg placement today  Medications:   sodium chloride   Intravenous Once   sodium chloride   Intravenous Once   amiodarone  200 mg Per Tube Daily   chlorhexidine gluconate (MEDLINE KIT)  15 mL Mouth Rinse BID   Chlorhexidine Gluconate Cloth  6 each Topical Q0600   Darbepoetin Alfa  100 mcg Subcutaneous Q7 days   gentamicin irrigation  80 mg Irrigation QID   hydrocortisone sod succinate (SOLU-CORTEF) inj  100 mg Intravenous Q12H   insulin aspart  0-20 Units Subcutaneous Q4H   insulin aspart  4 Units Subcutaneous Q4H   insulin detemir  17 Units Subcutaneous BID   magic mouthwash w/lidocaine  10 mL Oral QID   mouth rinse  15 mL Mouth Rinse 10 times per day   midodrine  10 mg Per Tube TID WC   multivitamin  1 tablet Per Tube QHS   nutrition supplement (JUVEN)  1 packet Per Tube BID BM   pantoprazole (PROTONIX) IV  40 mg Intravenous Q24H   sodium chloride flush  10-40 mL Intracatheter Q12H   vitamin A  10,000 Units Per Tube Daily   zinc sulfate  220 mg Per Tube Daily    Objective: Vital signs in last 24 hours: Temp:  [97.4 F (36.3 C)-98.9 F  (37.2 C)] 97.8 F (36.6 C) (02/14 1415) Pulse Rate:  [54-84] 67 (02/14 1415) Resp:  [12-23] 17 (02/14 1415) BP: (86-138)/(39-66) 111/45 (02/14 1415) SpO2:  [95 %-100 %] 100 % (02/14 1415) FiO2 (%):  [30 %-40 %] 30 % (02/14 1100) Weight:  [118.5 kg] 118.5 kg (02/14 0455)  PHYSICAL EXAM:  General:awake trachesotomy Oral cavity- dried blood B/l IJ lines On CRRT Lungs: Bilateral air entry  heart: Regular rate and rhythm, no murmur, rub or gallop. Abdomen: Soft, peg in place Extremities: Left leg has wound VAC Picture reviewed     Lymph: Cervical, supraclavicular normal. Neurologic: Cannot be assessed    Lab Results Recent Labs    05/27/21 0455 05/27/21 1551 05/28/21 0439 05/28/21 1127  WBC 18.4*  --  13.7*  --   HGB 7.5*  --  6.8* 7.6*  HCT 24.2*  --  22.1* 24.8*  NA 136 134* 136  --   K 3.6 3.6 3.8  --   CL 99 101 104  --   CO2 _0 --   BUN 47* 57* 54*  --   CREATININE 1.36* 1.34* 1.34*  --    Liver Panel Recent Labs    05/27/21 1551 05/28/21 0439  ALBUMIN 2.4* 2.9*   Microbiology: [facility] 05/04/21- GAS 05/07/21-  Wound culture NG 05/09/21 BC -NG Neck wound/tracheal aspiration with Pseudomonas Studies/Results:   No results found.   Assessment/Plan:  Group A streptococcus bacteremia with invasive infection with septic shock, multiorgan failure.  Necrotizing infection of the left leg.  Status post debridement.  Has wound VAC Patient completed 15 days of appropriate antibiotics   She also received 3 doses of IVIG.  Leukocytosis.   Fluctuating- can be a stress response to the severe anemia she had over the weekend  Anemia received PRBC  -2  units over weekend  Pseudomonas in culture from the tracheal wound aspirate which could be respiratory secretions coating the tracheostomy  B/l lung infiltrates questioning pneumonia Continue zosyn- end date 05/31/21  Encephalopathy: Improved Seizures on Keppra Small subdural hemorrhage.  Normal MRA  AKI on  CRRT  Acute hypoxic respiratory failure.  Tracheostomy and is on the vent  Shock liver with transaminitis improving  Anemia due to infection and blood loss from the left leg.  Has received multiple units of PRBC  A-fib now in sinus rhythm.  On amiodarone.    PEG in place  Discussed management with the care team.  ID will follow peripherally- call if needed

## 2021-05-28 NOTE — Progress Notes (Signed)
NAME:  Alexandra Foster, MRN:  [id number], DOB:  February 28, 1951, LOS: 24 ADMISSION DATE:  05/04/2021  History of present Illness:  71 y.o female with medical history significant of Asthma/COPD, T2DM, HTN, OSA on CPAP, GERD, COVID-19 pneumonia, and Bilateral lower extremity edema who presented to the ED with chief complaint of LLE pain and chills since Thursday.  Patient was given 30 cc/kg of fluids and started on broad-spectrum antibiotics for sepsis with septic shock.   Admitted to the ICU with severe sepsis with shock secondary to cellulitis of LLE septic shock with LLL necrotizing fascitis post op resp failure and severe septic shock with progressive renal failure on CRRT and progressive multiorgan failure  MRI/CT SHOWS SMALL [place] [facility] Events   05/04/21: Admitted to the ICU with severe sepsis with shock secondary to cellulitis of LLE 1/23 off pressors +Afib with RVR 1/24 blood pressure low  1/24 to OR for fasciotomy 1/25 post op resp failure with severe septic shock 1/26 remains on pressors 1/27 returns to the OR today for debridement, wound VAC 1/28 overnight with hemodynamic deterioration, required reintubation, currently on pressors 1/29 remains on 3 pressors, CRRT, wound VAC and minimal sedation.  Awakens to voice and touch.  Seen by vascular surgery and awaiting input from general surgery regarding amputation of left leg 1/30 remains on vent remains on pressors 1/31 remains on pressors, on vent, on CRRT 2/1 remains on pressors, on CRRT, MRI/CT shows SAH, afib with RVR, ECHO pending, restarted AMIO at 33 2/2 remains on vent plan for trach tomorrow 2/3 remains on crrt, remains on vent, plan for [facility] and wound vac change in OR 2/3 S/P TRACH,  Implantation of cellular matrix on left leg 2/4 REMAINS ON PRESSORS, REMAINS ON VENT, REMAINS ON CRRT 2/5 REMAINS ON VENT, OFF PRESSORS, REMAINS ON CRRT 05/20/21- Patient is improved she is able to follow communication verbally  and move extermities. She is on 5L/min Trache collar. We have stopped levophed and working on reducing vasopressin. She remains on CRRT.  Insulin infusion is ongoing and plan to have weight based regimen initiated. Additionally she will have SLP for vocalization and PMV trial as well as ROM training with OT/PT.  Multiple specialists on case appreciate everyone involved.   05/21/21-patient is improved mildly , she's off CRRT, shes off insulin drip. She is mentating.  She was evaluated by [facility]  She was unable to vocalize with PMV.  She may need PEG tube for nourishment.   05/22/21- patient is overnight worsened with fever and increased levophed dose.  She is negative 1.7L.   She is on zosyn.   05/23/21- patient is improved this am. She is giving thumbs up during examination. 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 She seems volume depleted as well so we will start CVP monitoring and give IV LR 1L bolus today to check for volume responsiveness. Albumin also while on CRRT. PT/OT starting today. Type and screen repeat due to plan for OR in am. On zosyn with ID following. Starting [place] heparin today.   05/24/21- patient is s/p repeat surgery on RLE. She is on levophed 32mg post surgery. She is not tachycardic and able to follow some verbal communication.    05/25/21- patient stable overnight. Reordering PT/OT today. Wbc count slightly trending up remains on abx probably post surgical.  Remains severely anemic on CRRT and vasopressor support with tracheostomy.   05/26/21- patient  with severe anemia today.  Reviewed with renal team for blood transfusion today.   2/13 remains critically ill, on pressors and CRRT 2/13 trach changed for a #8 cuffed distal XLT without difficulty. Wound cleaner. PER ENT 2/14 remains on pressors, CRRT, VENT, plan for PEG and WOUND vac change at bedside 2/14 1 unit of  PRBC's  Significant Diagnostic Tests:  1/21: Chest Xray> no active cardiopulmonary process 1/21: CT left lower leg>Diffuse subcutaneous edema may represent cellulitis. No drainable fluid collection/abscess 1/21: Ultrasound lower unilateral left> no DVT   Micro Data:  1/21: SARS-CoV-2 PCR> negative 1/21: Influenza PCR> negative 1/21: Blood culture x2> Streptococcus pyogenes 1/21: Urine Culture> negative 1/21: MRSA PCR>> negative 2/7 PSEUDOMONAS INFECTION TRACH SITE   Antimicrobials:  Vancomycin 1/21> off Cefepime 1/21> off Ceftriaxone 1/21 >> off Penicilli G 1/24>> Linezolid 1/25>>  *Antimicrobials per ID- zosyn now,  Gentamycin Gauze to [facility] site   INTERVAL CHANGES Remains critically ill Remains on pressors Remains on vent Unit PRBC's given today  Vent Mode: PSV;CPAP FiO2 (%):  [40 %] 40 % PEEP:  [5 cmH20] 5 cmH20 Pressure Support:  [5 cmH20] 5 cmH20 Plateau Pressure:  [14 cmH20] 14 cmH20  Plan for PEG tube and wound vac change at bedside        Objective   Blood pressure 113/61, pulse 65, temperature 98.4 F (36.9 C), temperature source Axillary, resp. rate 16, height _0  (1.651 m), weight 118.5 kg, SpO2 100 %.    Vent Mode: PSV;CPAP FiO2 (%):  [40 %] 40 % PEEP:  [5 cmH20] 5 cmH20 Pressure Support:  [5 cmH20] 5 cmH20 Plateau Pressure:  [14 cmH20] 14 cmH20   Intake/Output Summary (Last 24 hours) at 05/28/2021 0725 Last data filed at 05/28/2021 0700 Gross per 24 hour  Intake 1418.98 ml  Output 1598 ml  Net -179.02 ml    Filed Weights   05/25/21 0500 05/27/21 0500 05/28/21 0455  Weight: 111.8 kg 111.5 kg 118.5 kg    REVIEW OF SYSTEMS  PATIENT IS UNABLE TO PROVIDE COMPLETE REVIEW OF SYSTEMS DUE TO SEVERE CRITICAL ILLNESS AND TOXIC METABOLIC ENCEPHALOPATHY    PHYSICAL EXAMINATION:  GENERAL:critically ill appearing, EYES: Pupils equal, round, reactive to light.  No scleral icterus.  MOUTH: Moist mucosal membrane. S/p TRACH NECK: Supple.   PULMONARY: +rhonchi,  CARDIOVASCULAR: S1 and S2.  No murmurs  GASTROINTESTINAL: Soft, nontender, -distended. Positive bowel sounds.  MUSCULOSKELETAL: No swelling, clubbing, or edema.  NEUROLOGIC: obtunded MUSCULOSKELETAL: Left lower extremity with wound VAC in place, surgery redressing.  Status post fasciotomy     Assessment & Plan   71 yo morbidly AAF Admitted to the ICU with severe sepsis with shock secondary to cellulitis of LLE septic shock with LLL necrotizing fascitis post op resp failure and severe septic shock with progressive renal failure on CRRT and progressive multiorgan failure   Severe ACUTE Hypoxic and Hypercapnic Respiratory Failure -continue Mechanical Ventilator support -Wean Fio2 and PEEP as tolerated -VAP/VENT bundle implementation - Wean PEEP & FiO2 as tolerated, maintain SpO2 > 88% - Head of bed elevated 30 degrees, VAP protocol in place - Plateau pressures less than 30 cm H20  - Intermittent chest x-ray & ABG PRN - Ensure adequate pulmonary hygiene  -will perform SAT/SBT when respiratory parameters are met TCT as tolerated   SEPTIC shock SOURCE-Refractory Septic Shock secondary to LLE necrotizing fasciitis Streptococcal toxic shock syndrome LLE POA and PSEUDOMONAS INFECTION -use vasopressors to keep MAP>65 as needed -follow ABG and LA as needed -follow up  cultures -emperic ABX -consider stress dose steroids  INFECTIOUS DISEASE -continue antibiotics as prescribed -follow up cultures -follow up ID consultation - Status postdebridement, wound VAC per surgery -Vascular and general surgery following S/p IVIG   ACUTE KIDNEY INJURY/Renal Failure-->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 -continue Foley Catheter-assess need -Avoid nephrotoxic agents -Follow urine output, BMP -Ensure adequate renal perfusion, optimize oxygenation -Renal dose medications Continue CRRT   Intake/Output Summary (Last 24 hours) at 05/28/2021 0730 Last data filed at 05/28/2021  0700 Gross per 24 hour  Intake 1418.98 ml  Output 1598 ml  Net -179.02 ml    SHOCK LIVER -imporved     - S/P supportive care for sepsis with improved liver enzymes and resolution of transaminitis     - resolved    Acute encephalopathy  Toxic metabolic encephalopathy Loaded with keppra; continue at 1gram bid MRI/CT HEAD SHOWS SMALL VOLUME SAH APPRECIATE NEUROSURGERY RECS -Neurology consulted NO ANTICOAGULATION FOR  1 MONTH AND THEN NEED TO RE-ASSESS NO ANTIPLATELET AGENTS FOR 1 MONTH AND THEN NEED TO RE-ASSESS CAN POSSIBLY CONSIDER BABY ASA BUT WOULD NEED TO PROCEED WITH CAUTION CAN CONSIDER  DVT PROPHYLAXIS AFTER SURGERY BASED ON RISK OF BLEEDING   ENDO - ICU hypoglycemic\Hyperglycemia protocol -check FSBS per protocol   GI GI PROPHYLAXIS as indicated  NUTRITIONAL STATUS DIET-->TF's on hold for PEG TUBE Constipation protocol as indicated   ELECTROLYTES -follow labs as needed -replace as needed -pharmacy consultation and following     Best practice:  Diet: NPO Pain/Anxiety/Delirium protocol (if indicated): Yes, initiated VAP protocol (if indicated): Yes, initiated DVT prophylaxis: Subcutaneous Heparin GI prophylaxis: PPI Glucose control: ICU hyperglycemia protocol, insulin drip discontinued; SQ insulin Central venous access:  Rt IJV Arterial line:  N/A Foley:  Yes Mobility:  bed rest  PT consulted: N/A Code Status:  full code Disposition: ICU    Labs   CBC: Recent Labs  Lab 05/24/21 0335 05/24/21 1300 05/25/21 0459 05/26/21 0514 05/26/21 1215 05/27/21 0455 05/28/21 0439  WBC 13.9*  --  14.9* 20.8*  --  18.4* 13.7*  NEUTROABS 12.0*  --  13.7* 19.1*  --  16.5* 12.2*  HGB 6.9*   < > 7.4* 5.9* 8.0* 7.5* 6.8*  HCT 21.7*   < > 24.0* 19.6* 25.8* 24.2* 22.1*  MCV 87.5  --  92.7 94.7  --  94.5 97.4  PLT 268  --  292 221  --  148* 183   < > = values in this interval not displayed.     Basic Metabolic Panel: Recent Labs  Lab 05/24/21 0335  05/24/21 0340 05/25/21 0459 05/25/21 1600 05/26/21 0514 05/26/21 1358 05/27/21 0455 05/27/21 1551 05/28/21 0439  NA  --    < > 135   < > 132* 132* 136 134* 136  K  --    < > 4.3   < > 3.6 3.6 3.6 3.6 3.8  CL  --    < > 100   < > 98 99 99 101 104  CO2  --    < > 21*   < > _0 GLUCOSE  --    < > 228*   < > 214* 253* 256* 198* 135*  BUN  --    < > 50*   < > 51* 48* 47* 57* 54*  CREATININE  --    < > 1.56*   < > 1.47* 1.32* 1.36* 1.34* 1.34*  CALCIUM  --    < > 9.3   < >  9.2 9.2 9.3 9.1 9.0  MG 2.1  --  2.1  --  1.9  --  2.0  --  2.2  PHOS  --    < > 3.0   < > 2.2* 2.4* 2.3* 3.7 3.3   < > = values in this interval not displayed.    GFR: Estimated Creatinine Clearance: 48.9 mL/min (A) (by C-G formula based on SCr of 1.34 mg/dL (H)). Recent Labs  Lab 05/25/21 0459 05/26/21 0514 05/27/21 0455 05/28/21 0439  WBC 14.9* 20.8* 18.4* 13.7*     Liver Function Tests: Recent Labs  Lab 05/22/21 1104 05/22/21 1615 05/26/21 0514 05/26/21 1358 05/27/21 0455 05/27/21 1551 05/28/21 0439  AST 102*  --   --   --   --   --   --   ALT 138*  --   --   --   --   --   --   ALKPHOS 323*  --   --   --   --   --   --   BILITOT 2.8*  --   --   --   --   --   --   PROT 6.3*  --   --   --   --   --   --   ALBUMIN 2.4*   < > 2.2* 2.4* 2.3* 2.4* 2.9*   < > = values in this interval not displayed.    Coagulation Profile: Recent Labs  Lab 05/22/21 0526 05/24/21 0335  INR 1.2 1.1     Cardiac Enzymes: No results for input(s): CKTOTAL, CKMB, CKMBINDEX, TROPONINI in the last 168 hours.   HbA1C: Hgb A1c MFr Bld  Date/Time Value Ref Range Status  05/05/2021 04:53 AM 6.7 (H) 4.8 - 5.6 % Final    Comment:    (NOTE)         Prediabetes: 5.7 - 6.4         Diabetes: >6.4         Glycemic control for adults with diabetes: <7.0   02/11/2021 09:41 AM 6.3 (H) <5.7 % of total Hgb Final    Comment:    For someone without known diabetes, a hemoglobin  A1c value between 5.7% and  6.4% is consistent with prediabetes and should be confirmed with a  follow-up test. . For someone with known diabetes, a value <7% indicates that their diabetes is well controlled. A1c targets should be individualized based on duration of diabetes, age, comorbid conditions, and other considerations. . This assay result is consistent with an increased risk of diabetes. . Currently, no consensus exists regarding use of hemoglobin A1c for diagnosis of diabetes for children. .     CBG: Recent Labs  Lab 05/27/21 1117 05/27/21 1512 05/27/21 1929 05/27/21 2332 05/28/21 0325  GLUCAP 200* 178* 157* 148* 130*    No Known Allergies   Medications  Scheduled Meds:  sodium chloride   Intravenous Once   sodium chloride   Intravenous Once   amiodarone  200 mg Per Tube Daily   vitamin C  500 mg Per Tube BID   chlorhexidine gluconate (MEDLINE KIT)  15 mL Mouth Rinse BID   Chlorhexidine Gluconate Cloth  6 each Topical Q0600   Darbepoetin Alfa  100 mcg Subcutaneous Q7 days   free water  30 mL Per Tube Q4H   gentamicin irrigation  80 mg Irrigation QID   hydrocortisone sod succinate (SOLU-CORTEF) inj  100 mg Intravenous Q12H   insulin aspart  0-20  Units Subcutaneous Q4H   insulin aspart  4 Units Subcutaneous Q4H   insulin detemir  17 Units Subcutaneous BID   magic mouthwash w/lidocaine  10 mL Oral QID   mouth rinse  15 mL Mouth Rinse 10 times per day   midodrine  10 mg Per Tube TID WC   multivitamin  1 tablet Per Tube QHS   nutrition supplement (JUVEN)  1 packet Per Tube BID BM   pantoprazole (PROTONIX) IV  40 mg Intravenous Q24H   sodium chloride flush  10-40 mL Intracatheter Q12H   vitamin A  10,000 Units Per Tube Daily   zinc sulfate  220 mg Per Tube Daily   Continuous Infusions:   prismasol BGK 4/2.5 400 mL/hr at 05/28/21 0219    prismasol BGK 4/2.5 400 mL/hr at 05/28/21 0217   sodium chloride 10 mL/hr at 05/28/21 0700   albumin human Stopped (05/28/21 0349)    norepinephrine (LEVOPHED) Adult infusion 2 mcg/min (05/28/21 0700)   piperacillin-tazobactam (ZOSYN)  IV Stopped (05/28/21 0612)   prismasol BGK 4/2.5 1,000 mL/hr at 05/28/21 0506   propofol (DIPRIVAN) infusion     vasopressin Stopped (05/24/21 2223)   PRN Meds:.acetaminophen, artificial tears, docusate, fentaNYL (SUBLIMAZE) injection, heparin, lip balm, midazolam, ondansetron (ZOFRAN) IV, oxyCODONE, sennosides, sodium chloride, sodium chloride flush    DVT/GI PRX  assessed I Assessed the need for Labs I Assessed the need for Foley I Assessed the need for Central Venous Line Family Discussion when available I Assessed the need for Mobilization I made an Assessment of medications to be adjusted accordingly Safety Risk assessment completed  CASE DISCUSSED IN MULTIDISCIPLINARY ROUNDS WITH ICU TEAM     Critical Care Time devoted to patient care services described in this note is 50 minutes.  Critical care was necessary to treat /prevent imminent and life-threatening deterioration. Overall, patient is critically ill, prognosis is guarded.  Patient with Multiorgan failure and at high risk for cardiac arrest and death.    Corrin Parker, M.D.  [facility]  Medical Director [place] Director [facility] Cardio-Pulmonary Department      DVT/GI PRX  assessed I Assessed the need for Labs I Assessed the need for Foley I Assessed the need for Central Venous Line Family Discussion when available I Assessed the need for Mobilization I made an Assessment of medications to be adjusted accordingly Safety Risk assessment completed  CASE DISCUSSED IN MULTIDISCIPLINARY ROUNDS WITH ICU TEAM     Critical Care Time devoted to patient care services described in this note is 55 minutes.  Critical care was necessary to treat /prevent imminent and life-threatening deterioration. Overall, patient is critically ill, prognosis is guarded.  Patient  with Multiorgan failure and at high risk for cardiac arrest and death.    Corrin Parker, M.D.  [facility]  Medical Director [place] Director [facility] Cardio-Pulmonary Department

## 2021-05-28 NOTE — Progress Notes (Addendum)
Patient ID: Alexandra Foster, female   DOB: 1951-04-02, 71 y.o.   MRN: [id number]     [place] [facility] Day(s): 24.   Interval History: Patient seen and examined, no acute events or new complaints overnight.  Patient was sedated for PEG placement.  No issues with negative pressure dressing.  Vital signs in last 24 hours: [min-max] current  Temp:  [97.4 F (36.3 C)-98.9 F (37.2 C)] 97.8 F (36.6 C) (02/14 1415) Pulse Rate:  [54-84] 64 (02/14 1430) Resp:  [12-23] 15 (02/14 1430) BP: (86-138)/(39-66) 109/45 (02/14 1430) SpO2:  [89 %-100 %] 100 % (02/14 1430) FiO2 (%):  [30 %-100 %] 30 % (02/14 1430) Weight:  [118.5 kg] 118.5 kg (02/14 0455)     Height: '5\' 5"'  (165.1 cm) Weight: 118.5 kg BMI (Calculated): 43.47   Physical Exam:  Constitutional: Consciously sedated.  On tracheal mask. Respiratory: breathing non-labored at rest  Cardiovascular: regular rate and sinus rhythm  Gastrointestinal: soft, non-tender, and non-distended Extremity: There is partial incorporation of the myriad matrix.  No purulence or necrotic tissue.       Labs:  CBC Latest Ref Rng & Units 05/28/2021 05/28/2021 05/27/2021  WBC 4.0 - 10.5 K/uL - 13.7(H) 18.4(H)  Hemoglobin 12.0 - 15.0 g/dL 7.6(L) 6.8(L) 7.5(L)  Hematocrit 36.0 - 46.0 % 24.8(L) 22.1(L) 24.2(L)  Platelets 150 - 400 K/uL - 183 148(L)   CMP Latest Ref Rng & Units 05/28/2021 05/27/2021 05/27/2021  Glucose 70 - 99 mg/dL 135(H) 198(H) 256(H)  BUN 8 - 23 mg/dL 54(H) 57(H) 47(H)  Creatinine 0.44 - 1.00 mg/dL 1.34(H) 1.34(H) 1.36(H)  Sodium 135 - 145 mmol/L 136 134(L) 136  Potassium 3.5 - 5.1 mmol/L 3.8 3.6 3.6  Chloride 98 - 111 mmol/L 104 101 99  CO2 22 - 32 mmol/L '25 25 26  ' Calcium 8.9 - 10.3 mg/dL 9.0 9.1 9.3  Total Protein 6.5 - 8.1 g/dL - - -  Total Bilirubin 0.3 - 1.2 mg/dL - - -  Alkaline Phos 38 - 126 U/L - - -  AST 15 - 41 U/L - - -  ALT 0 - 44 U/L - - -    Imaging studies: No new pertinent imaging  studies   Assessment/Plan:  71 y.o. female with left leg necrotizing fasciitis with 24 Day Post-Op s/p debridement, complicated by pertinent comorbidities including strep pyogenes bacteremia now in septic shock (resolved), respiratory failure mechanical ventilation (resolved), liver failure (resolved), acute renal failure on CRRT, COPD, type 2 diabetes mellitus, hypertension, obstructive sleep apnea, GERD   Left leg necrotizing fasciitis -S/p debridement 05/07/2021 -Second debridement on 05/10/2021.  Application of wound VAC system -S/p myriad matrix placement on 05/17/2021 -I personally changed the negative pressure dressing (DME) today.  No sign of clinical deterioration.  We will continue with current management off of negative pressure dressing expecting the incorporation of myriad matrix. -I will continue to follow closely.  Arnold Long, MD

## 2021-05-28 NOTE — Progress Notes (Signed)
OT Cancellation Note  Patient Details Name: Alexandra Foster MRN: [id number] DOB: 01/11/1951   Cancelled Treatment:    Reason Eval/Treat Not Completed: Medical issues which prohibited therapy. Pt noted with Hgb 6.8 today, currently getting blood transfusion. Will re-attempt OT tx at later date/time as medically appropriate.   Ardeth Perfect., MPH, MS, OTR/L ascom [phone] 05/28/21, 9:47 AM

## 2021-05-28 NOTE — Anesthesia Preprocedure Evaluation (Deleted)
Anesthesia Evaluation  Patient identified by MRN, date of birth, ID band Patient awake    Reviewed: Allergy & Precautions, H&P , NPO status , Patient's Chart, lab work & pertinent test results, reviewed documented beta blocker date and time   Airway Mallampati: II   Neck ROM: full   Comment: Spont. Ventilation with trach collar. 28%fio2 Dental  (+) Poor Dentition   Pulmonary asthma , sleep apnea , COPD, former smoker,    Pulmonary exam normal        Cardiovascular Exercise Tolerance: Poor hypertension, On Medications Normal cardiovascular exam+ dysrhythmias Atrial Fibrillation  Rhythm:regular Rate:Normal  Bilateral lower extremity edema  ECHO 2/23: 1. Left ventricular ejection fraction, by estimation, is 60 to 65%. The  left ventricle has normal function. The left ventricle has no regional  wall motion abnormalities. There is mild left ventricular hypertrophy.  Left ventricular diastolic parameters  are consistent with Grade I diastolic dysfunction (impaired relaxation).  2. Right ventricular systolic function is normal. The right ventricular  size is normal. There is mildly elevated pulmonary artery systolic  pressure. The estimated right ventricular systolic pressure is 67.8 mmHg.  3. The mitral valve is normal in structure. No evidence of mitral valve  regurgitation. No evidence of mitral stenosis.  4. The aortic valve is normal in structure. Aortic valve regurgitation is  not visualized. No aortic stenosis is present.  5. The inferior vena cava is normal in size with greater than 50%  respiratory variability, suggesting right atrial pressure of 3 mmHg.    Neuro/Psych negative neurological ROS  negative psych ROS   GI/Hepatic GERD  ,(+) Hepatitis -  Endo/Other  diabetes, Type 2partial adrenal insufficiency  Renal/GU ESRF and DialysisRenal disease  negative genitourinary   Musculoskeletal   Abdominal    Peds  Hematology  (+) Blood dyscrasia (6.8 am 2/14, Transfused 1 unit in response), anemia ,   Anesthesia Other Findings Pt was admitted to the ICU with severe sepsis with shock secondary to cellulitis of LLE septic shock with LLL necrotizing fascitis post op resp failure and severe septic shock with progressive renal failure on CRRT and progressive multiorgan failure. Trach placed 2/3. #8 cuffed distal XLT. Pt remains on pressors, CRRT, VENT, plan for PEG and WOUND vac change at bedside   Past Medical History: No date: AKI (acute kidney injury) ([place])     Comment:  a. 04/2021 in setting of bacteremia/shock. No date: Arthritis No date: Asthma No date: Bacteremia     Comment:  a. 04/2021 S pyogenes bacteremia in setting of lower ext               cellulitis. No date: COPD (chronic obstructive pulmonary disease) (HCC) No date: Diabetes mellitus without complication (HCC) No date: Endometriosis No date: GERD (gastroesophageal reflux disease) No date: History of echocardiogram     Comment:  a. 07/2013 Echo: EF 55-60%, impaired relaxation, mild TR;              b. 04/2021 Echo: EF 50-55%, mild LVH, nl RV fxn, mild BAE,              Ao sclerosis w/o stenosis. No date: Hypertension No date: Obesity No date: PAF (paroxysmal atrial fibrillation) ([place])     Comment:  a. 04/2021 in setting of septic shock/cellulitis. No date: Sleep apnea     Comment:  CPAP Past Surgical History: No date: ABDOMINAL HYSTERECTOMY 9/38/1017: APPLICATION OF WOUND VAC     Comment:  Procedure: APPLICATION OF WOUND VAC;  Surgeon:               Herbert Pun, MD;  Location: [facility]S;  Service:              General;; 04/19/1094: APPLICATION OF WOUND VAC; Left     Comment:  Procedure: APPLICATION OF WOUND VAC/WOUND VAC               EXCHANGE-[employer];  Surgeon: Herbert Pun,               MD;  Location: [facility] ORS;  Service: General;  Laterality:               Left; 10/29/2006: COLONOSCOPY      Comment:  Dr Allen Norris 11/05/2016: COLONOSCOPY WITH PROPOFOL; N/A     Comment:  Procedure: COLONOSCOPY WITH PROPOFOL;  Surgeon: Robert Bellow, MD;  Location: [facility];  Service:               Endoscopy;  Laterality: N/A; 05/10/2021: INCISION AND DRAINAGE OF WOUND; Left     Comment:  Procedure: IRRIGATION AND DEBRIDEMENT LEFT LEG;                Surgeon: Herbert Pun, MD;  Location: [facility];               Service: General;  Laterality: Left; 2002: NASAL SINUS SURGERY     Comment:  Dr Carlis Abbott 05/17/2021: [place]; N/A     Comment:  Procedure: TRACHEOSTOMY;  Surgeon: Clyde Canterbury, MD;                Location: [facility] OR[facility];  Service: ENT;  Laterality: N/A; 05/07/2021: WOUND DEBRIDEMENT; Left     Comment:  Procedure: DEBRIDEMENT WOUND;  Surgeon: Herbert Pun, MD;  Location: [facility];  Service: General;                Laterality: Left; BMI    Body Mass Index: 40.54 kg/m     Reproductive/Obstetrics negative OB ROS                            Anesthesia Physical  Anesthesia Plan  ASA: 4  Anesthesia Plan: General   Post-op Pain Management:    Induction: Intravenous  PONV Risk Score and Plan: 2 and Treatment may vary due to age or medical condition and Ondansetron  Airway Management Planned: Tracheostomy  Additional Equipment:   Intra-op Plan:   Post-operative Plan:   Informed Consent:   Plan Discussed with: CRNA  Anesthesia Plan Comments:         Anesthesia Quick Evaluation

## 2021-05-28 NOTE — Progress Notes (Signed)
SLP Cancellation Note  Patient Details Name: Reighlyn Elmes MRN: [id number] DOB: 1951-04-09   Cancelled treatment:       Reason Eval/Treat Not Completed: Patient not medically ready;Medical issues which prohibited therapy (chart reviewed) Per chart review, pt has severe anemia; Hgb 6.8 today w/ transfusion. Per MD notes, ENT changed trach on 05/27/2021 for a #8 Cuffed distal XLT, wound cleaned by ENT. On 05/28/2021, pt remains on pressors, CRRT, VENT w/ plan for PEG and WOUND vac change at bedside.  As pt remains on Vent support and is unable to downsize in trach diameter to a #6 at this time, the PMV is not able to be considered for use/wear. When pt is able to return to TC, tolerate 4-6 hours at a time on TC, and have the trach downsized by ENT to a #6 diameter, ST services can be re-consulted for PMV therapy. In order to maintain oral hygiene and stimulation of swallowing while NPO w/ NGT, and PEG planned, recommend frequent use of oral swabs. This has been discussed w/ family and NSG w/ order placed in chart. Recommend pt have f/u ST services at discharge to next venue of care in order to address PMV and dysphagia therapies.     Orinda Kenner, MS, CCC-SLP Speech Language Pathologist Rehab Services; [place] [phone] (ascom) Rayla Pember 05/28/2021, 3:03 PM

## 2021-05-28 NOTE — Progress Notes (Signed)
[facility]  PROGRESS NOTE   Subjective:   CRRT continued. UF goal of net even. UOP minimal. Blood flow rate 250 cc/min Norepinephrine gtt continued PRBC transfusion over the weekend and today Plan for PEG and wound vac change on tuesday3 Patient quite somnolent today-especially after pain medications.  Objective:  Vital signs in last 24 hours:  Temp:  [97.4 F (36.3 C)-99 F (37.2 C)] 97.4 F (36.3 C) (02/14 0745) Pulse Rate:  [59-84] 65 (02/14 0830) Resp:  [14-21] 17 (02/14 0830) BP: (86-138)/(39-66) 112/53 (02/14 0830) SpO2:  [95 %-100 %] 99 % (02/14 0830) FiO2 (%):  [40 %] 40 % (02/14 0830) Weight:  [118.5 kg] 118.5 kg (02/14 0455)  Weight change: 7 kg Filed Weights   05/25/21 0500 05/27/21 0500 05/28/21 0455  Weight: 111.8 kg 111.5 kg 118.5 kg    Intake/Output: I/O last 3 completed shifts: In: 2364.8 [I.V.:327.6; Other:30; NG/GT:1400; IV Piggyback:607.2] Out: 2734 [Emesis/NG output:90; Drains:150; TKPTW:6568; Stool:330]   Intake/Output this shift:  Total I/O In: 226.7 [I.V.:24.7; Blood:152; NG/GT:50] Out: 65 [Other:65]  Physical Exam: General:  Critically ill  Head:  [place]/AT  Eyes:  Anicteric  Neck:  Trachea collar, tracheostomy, vent assisted  Lungs:   diminished bilaterally,    Heart:  regular, bradycardia, heart rate in 50s  Abdomen:   Soft, nontender  Extremities:  Left lower extremity wound vac  Neurologic: Somnolent today but arousable  Skin:  No lesions  Access: Left IJ temp HD catheter    Basic Metabolic Panel: Recent Labs  Lab 05/24/21 0335 05/24/21 0340 05/25/21 0459 05/25/21 1600 05/26/21 0514 05/26/21 1358 05/27/21 0455 05/27/21 1551 05/28/21 0439  NA  --    < > 135   < > 132* 132* 136 134* 136  K  --    < > 4.3   < > 3.6 3.6 3.6 3.6 3.8  CL  --    < > 100   < > 98 99 99 101 104  CO2  --    < > 21*   < > _0 GLUCOSE  --    < > 228*   < > 214* 253* 256* 198* 135*  BUN  --    < > 50*   < > 51* 48* 47* 57*  54*  CREATININE  --    < > 1.56*   < > 1.47* 1.32* 1.36* 1.34* 1.34*  CALCIUM  --    < > 9.3   < > 9.2 9.2 9.3 9.1 9.0  MG 2.1  --  2.1  --  1.9  --  2.0  --  2.2  PHOS  --    < > 3.0   < > 2.2* 2.4* 2.3* 3.7 3.3   < > = values in this interval not displayed.     CBC: Recent Labs  Lab 05/24/21 0335 05/24/21 1300 05/25/21 0459 05/26/21 0514 05/26/21 1215 05/27/21 0455 05/28/21 0439  WBC 13.9*  --  14.9* 20.8*  --  18.4* 13.7*  NEUTROABS 12.0*  --  13.7* 19.1*  --  16.5* 12.2*  HGB 6.9*   < > 7.4* 5.9* 8.0* 7.5* 6.8*  HCT 21.7*   < > 24.0* 19.6* 25.8* 24.2* 22.1*  MCV 87.5  --  92.7 94.7  --  94.5 97.4  PLT 268  --  292 221  --  148* 183   < > = values in this interval not displayed.      Urinalysis:  No results for input(s): COLORURINE, LABSPEC, [place], GLUCOSEU, HGBUR, BILIRUBINUR, KETONESUR, PROTEINUR, UROBILINOGEN, NITRITE, LEUKOCYTESUR in the last 72 hours.  Invalid input(s): APPERANCEUR    Imaging: No results found.   Medications:     prismasol BGK 4/2.5 400 mL/hr at 05/28/21 0219    prismasol BGK 4/2.5 400 mL/hr at 05/28/21 0217   sodium chloride 10 mL/hr at 05/28/21 0900   albumin human Stopped (05/28/21 0349)   norepinephrine (LEVOPHED) Adult infusion 3 mcg/min (05/28/21 0900)   piperacillin-tazobactam (ZOSYN)  IV Stopped (05/28/21 0612)   prismasol BGK 4/2.5 1,000 mL/hr at 05/28/21 0506   propofol (DIPRIVAN) infusion     vasopressin Stopped (05/24/21 2223)    sodium chloride   Intravenous Once   sodium chloride   Intravenous Once   amiodarone  200 mg Per Tube Daily   vitamin C  500 mg Per Tube BID   chlorhexidine gluconate (MEDLINE KIT)  15 mL Mouth Rinse BID   Chlorhexidine Gluconate Cloth  6 each Topical Q0600   Darbepoetin Alfa  100 mcg Subcutaneous Q7 days   free water  30 mL Per Tube Q4H   gentamicin irrigation  80 mg Irrigation QID   hydrocortisone sod succinate (SOLU-CORTEF) inj  100 mg Intravenous Q12H   insulin aspart  0-20 Units  Subcutaneous Q4H   insulin aspart  4 Units Subcutaneous Q4H   insulin detemir  17 Units Subcutaneous BID   magic mouthwash w/lidocaine  10 mL Oral QID   mouth rinse  15 mL Mouth Rinse 10 times per day   midodrine  10 mg Per Tube TID WC   multivitamin  1 tablet Per Tube QHS   nutrition supplement (JUVEN)  1 packet Per Tube BID BM   pantoprazole (PROTONIX) IV  40 mg Intravenous Q24H   sodium chloride flush  10-40 mL Intracatheter Q12H   vitamin A  10,000 Units Per Tube Daily   zinc sulfate  220 mg Per Tube Daily    Assessment/ Plan:     Principal Problem:   Severe sepsis with septic shock (CODE) (HCC) Active Problems:   COPD (chronic obstructive pulmonary disease) (HCC)   Morbid obesity (HCC)   Diabetes mellitus type 2 in obese ([place])   Atrial fibrillation and flutter ([place])   Necrotizing fasciitis ([place])   Acute on chronic respiratory failure ([place])   On mechanically assisted ventilation (HCC)   Shock liver   Anemia associated with acute blood loss   Metabolic acidosis   Encounter for continuous renal replacement therapy (CRRT) for acute renal failure (HCC)   Acute encephalopathy   Group A streptococcal infection   Streptococcal toxic shock syndrome ([place])   Status post tracheostomy ([place])   Subdural hemorrhage (HCC)   Left leg cellulitis  Ms. Graclynn Vanantwerp Balash is a 71 y.o. black female with COPD/asthma, diabetes mellitus type II, hypertension, obstructive sleep apnea, paroxysmal atrial fibrillation, GERD, who presents to [facility] on 05/04/2021 for AKI (acute kidney injury) ([place]) [N17.9] Left leg cellulitis [L03.116] Severe sepsis ([place]) [A41.9, R65.20] Severe sepsis with lactic acidosis ([place]) [A41.9, R65.20, E87.20] Found to have necrotizing fascitis requiring multiple debridements by Dr. Peyton Najjar: 1/24. 1/27, 2/3 and 2/10.    #1: Acute kidney injury: AKI is secondary to ischemic nephropathy secondary to hypotension complicated with sepsis. History of bland urine. Baseline creatinine  of 0.98, GFR > 60 on 02/11/21. Anuric urine output.  - Continue continuous renal replacement therapy. Net even ultrafiltration.  - IV albumin supplementation -Continue on 4 K bath - plan  for trial of IHD on Wednesday; as pressor requirement is only low-dose   #2: Sepsis and hypotension: necrotizing fasciitis for left lower extremity status post multiple debridement. Continues to lose volume from wound vac. Requiring vasopressors: norepinephrine. On stress dose steroids and midodrine - Continue zosyn. - Appreciate ID input  #3. Anemia with Acute kidney injury:  Lab Results  Component Value Date   HGB 6.8 (L) 05/28/2021    S/p blood transfusions this admission Aranesp weekly SQ (mon)  #4. Acute Respiratory Failure Ventilator assisted. Management as per ICU team    LOS: [place], MD [facility] 2/14/20239:06 AM

## 2021-05-29 ENCOUNTER — Encounter
Admission: EM | Disposition: A | Discharge status: Nursing Home (Includes ICF) | Payer: Self-pay | Source: Home / Self Care | Attending: Internal Medicine

## 2021-05-29 ENCOUNTER — Encounter: Payer: Self-pay | Admitting: Surgery

## 2021-05-29 DIAGNOSIS — J9602 Acute respiratory failure with hypercapnia: Secondary | ICD-10-CM

## 2021-05-29 DIAGNOSIS — J9601 Acute respiratory failure with hypoxia: Secondary | ICD-10-CM

## 2021-05-29 DIAGNOSIS — R140 Abdominal distension (gaseous): Secondary | ICD-10-CM | POA: Diagnosis not present

## 2021-05-29 DIAGNOSIS — J9620 Acute and chronic respiratory failure, unspecified whether with hypoxia or hypercapnia: Secondary | ICD-10-CM | POA: Diagnosis not present

## 2021-05-29 DIAGNOSIS — J9621 Acute and chronic respiratory failure with hypoxia: Secondary | ICD-10-CM | POA: Diagnosis not present

## 2021-05-29 DIAGNOSIS — D62 Acute posthemorrhagic anemia: Secondary | ICD-10-CM

## 2021-05-29 HISTORY — PX: IVC FILTER INSERTION: CATH118245

## 2021-05-29 LAB — RENAL FUNCTION PANEL
Albumin: 3.1 g/dL — ABNORMAL LOW (ref 3.5–5.0)
Albumin: 2.9 g/dL — ABNORMAL LOW (ref 3.5–5.0)
Anion gap: 6 (ref 5–15)
Anion gap: 8 (ref 5–15)
BUN: 37 mg/dL — ABNORMAL HIGH (ref 8–23)
BUN: 37 mg/dL — ABNORMAL HIGH (ref 8–23)
CO2: 26 mmol/L (ref 22–32)
CO2: 25 mmol/L (ref 22–32)
Calcium: 9.1 mg/dL (ref 8.9–10.3)
Calcium: 9 mg/dL (ref 8.9–10.3)
Chloride: 105 mmol/L (ref 98–111)
Chloride: 104 mmol/L (ref 98–111)
Creatinine, Ser: 1.33 mg/dL — ABNORMAL HIGH (ref 0.44–1.00)
Creatinine, Ser: 1.34 mg/dL — ABNORMAL HIGH (ref 0.44–1.00)
GFR, Estimated: 43 mL/min — ABNORMAL LOW (ref >60)
GFR, Estimated: 42 mL/min — ABNORMAL LOW (ref >60)
Glucose, Bld: 143 mg/dL — ABNORMAL HIGH (ref 70–99)
Glucose, Bld: 147 mg/dL — ABNORMAL HIGH (ref 70–99)
Phosphorus: 3.5 mg/dL (ref 2.5–4.6)
Phosphorus: 3.6 mg/dL (ref 2.5–4.6)
Potassium: 4.2 mmol/L (ref 3.5–5.1)
Potassium: 4.3 mmol/L (ref 3.5–5.1)
Sodium: 137 mmol/L (ref 135–145)
Sodium: 137 mmol/L (ref 135–145)

## 2021-05-29 LAB — CBC WITH DIFFERENTIAL/PLATELET
Abs Immature Granulocytes: 0.06 10*3/uL (ref 0.00–0.07)
Basophils Absolute: 0 10*3/uL (ref 0.0–0.1)
Basophils Relative: 0 %
Eosinophils Absolute: 0 10*3/uL (ref 0.0–0.5)
Eosinophils Relative: 0 %
HCT: 27.3 % — ABNORMAL LOW (ref 36.0–46.0)
Hemoglobin: 8.4 g/dL — ABNORMAL LOW (ref 12.0–15.0)
Immature Granulocytes: 1 %
Lymphocytes Relative: 4 %
Lymphs Abs: 0.4 10*3/uL — ABNORMAL LOW (ref 0.7–4.0)
MCH: 29 pg (ref 26.0–34.0)
MCHC: 30.8 g/dL (ref 30.0–36.0)
MCV: 94.1 fL (ref 80.0–100.0)
Monocytes Absolute: 0.5 10*3/uL (ref 0.1–1.0)
Monocytes Relative: 5 %
Neutro Abs: 8.8 10*3/uL — ABNORMAL HIGH (ref 1.7–7.7)
Neutrophils Relative %: 90 %
Platelets: 184 10*3/uL (ref 150–400)
RBC: 2.9 MIL/uL — ABNORMAL LOW (ref 3.87–5.11)
RDW: 20.9 % — ABNORMAL HIGH (ref 11.5–15.5)
WBC: 9.8 10*3/uL (ref 4.0–10.5)
nRBC: 2.1 % — ABNORMAL HIGH (ref 0.0–0.2)

## 2021-05-29 LAB — GLUCOSE, CAPILLARY
Glucose-Capillary: 113 mg/dL — ABNORMAL HIGH (ref 70–99)
Glucose-Capillary: 130 mg/dL — ABNORMAL HIGH (ref 70–99)
Glucose-Capillary: 137 mg/dL — ABNORMAL HIGH (ref 70–99)
Glucose-Capillary: 137 mg/dL — ABNORMAL HIGH (ref 70–99)
Glucose-Capillary: 141 mg/dL — ABNORMAL HIGH (ref 70–99)
Glucose-Capillary: 160 mg/dL — ABNORMAL HIGH (ref 70–99)

## 2021-05-29 LAB — BPAM RBC
Blood Product Expiration Date: 202303172359
ISSUE DATE / TIME: 202302140640
Unit Type and Rh: 5100

## 2021-05-29 LAB — TYPE AND SCREEN
ABO/RH(D): O POS
Antibody Screen: NEGATIVE
Unit division: 0

## 2021-05-29 LAB — MAGNESIUM: Magnesium: 2.5 mg/dL — ABNORMAL HIGH (ref 1.7–2.4)

## 2021-05-29 SURGERY — IVC FILTER INSERTION
Anesthesia: Moderate Sedation

## 2021-05-29 MED ORDER — CEFAZOLIN SODIUM-DEXTROSE 1-4 GM/50ML-% IV SOLN
1.0000 g | INTRAVENOUS | Status: AC
  Filled 2021-05-29: qty 50

## 2021-05-29 MED ORDER — MIDAZOLAM HCL 2 MG/2ML IJ SOLN
INTRAMUSCULAR | Status: DC | PRN
  Administered 2021-05-29: 1 mg via INTRAVENOUS

## 2021-05-29 MED ORDER — SODIUM CHLORIDE 0.9 % IV SOLN
80.0000 mg | Freq: Four times a day (QID) | INTRAVENOUS | Status: DC
  Administered 2021-05-29 – 2021-06-06 (×32): 80 mg
  Filled 2021-05-29 (×15): qty 2
  Filled 2021-05-29: qty 3.2
  Filled 2021-05-29 (×9): qty 2
  Filled 2021-05-29: qty 3.2
  Filled 2021-05-29 (×4): qty 2
  Filled 2021-05-29: qty 3.2
  Filled 2021-05-29 (×5): qty 2

## 2021-05-29 MED ORDER — FREE WATER
30.0000 mL | Status: DC
  Administered 2021-05-29 – 2021-06-13 (×78): 30 mL

## 2021-05-29 MED ORDER — MIDAZOLAM HCL 2 MG/2ML IJ SOLN
INTRAMUSCULAR | Status: AC
  Filled 2021-05-29: qty 4

## 2021-05-29 MED ORDER — ASCORBIC ACID 500 MG PO TABS
500.0000 mg | ORAL_TABLET | Freq: Two times a day (BID) | ORAL | Status: DC
  Administered 2021-05-29 – 2021-06-13 (×27): 500 mg
  Filled 2021-05-29 (×27): qty 1

## 2021-05-29 MED ORDER — OXYCODONE HCL 5 MG PO TABS
5.0000 mg | ORAL_TABLET | Freq: Four times a day (QID) | ORAL | Status: DC | PRN
  Administered 2021-06-02 – 2021-06-03 (×2): 5 mg
  Filled 2021-05-29: qty 1

## 2021-05-29 MED ORDER — ESCITALOPRAM OXALATE 10 MG PO TABS
10.0000 mg | ORAL_TABLET | Freq: Every day | ORAL | Status: DC
  Administered 2021-05-29 – 2021-06-13 (×15): 10 mg
  Filled 2021-05-29 (×16): qty 1

## 2021-05-29 MED ORDER — IODIXANOL 320 MG/ML IV SOLN
INTRAVENOUS | Status: DC | PRN
  Administered 2021-05-29: 20 mL via INTRAVENOUS

## 2021-05-29 MED ORDER — OXYCODONE HCL 5 MG PO TABS
5.0000 mg | ORAL_TABLET | Freq: Four times a day (QID) | ORAL | Status: DC
  Administered 2021-05-29 – 2021-06-05 (×27): 5 mg
  Filled 2021-05-29 (×28): qty 1

## 2021-05-29 MED ORDER — FENTANYL CITRATE PF 50 MCG/ML IJ SOSY
PREFILLED_SYRINGE | INTRAMUSCULAR | Status: AC
  Filled 2021-05-29: qty 2

## 2021-05-29 MED ORDER — PIVOT 1.5 CAL PO LIQD
1000.0000 mL | ORAL | Status: DC
  Administered 2021-05-29 – 2021-06-13 (×17): 1000 mL
  Filled 2021-05-29 (×2): qty 1000

## 2021-05-29 SURGICAL SUPPLY — 9 items
COVER PROBE U/S 5X48 (MISCELLANEOUS) ×1 IMPLANT
GOWN SRG XL LVL 3 NONREINFORCE (GOWNS) IMPLANT
GOWN STRL NON-REIN TWL XL LVL3 (GOWNS) ×2
GUIDEWIRE VERSACORE 260 (WIRE) ×1 IMPLANT
HANDLE YANKAUER SUCT BULB TIP (MISCELLANEOUS) ×1 IMPLANT
KIT FEM OPTION ELITE FILTER (Filter) ×1 IMPLANT
PACK ANGIOGRAPHY (CUSTOM PROCEDURE TRAY) ×2 IMPLANT
TUBING CONNECTING 10 (TUBING) ×2 IMPLANT
WIRE GUIDERIGHT .035X150 (WIRE) ×1 IMPLANT

## 2021-05-29 NOTE — Op Note (Signed)
[place] [facility]   OPERATIVE NOTE    PRE-OPERATIVE DIAGNOSIS: High risk DVT in a septic patient with multisystem organ failure and high risk for PE  POST-OPERATIVE DIAGNOSIS: Same  PROCEDURE: 1.   Ultrasound guidance for vascular access to the right common femoral vein 2.   Catheter placement into the inferior vena cava 3.   Inferior venacavogram 4.   Placement of a option Elite IVC filter  SURGEON: Hortencia Pilar  ASSISTANT(S): None  ANESTHESIA: Conscious sedation was administered by the interventional radiology RN under my direct supervision. IV Versed plus fentanyl were utilized. Continuous ECG, pulse oximetry and blood pressure was monitored throughout the entire procedure. Conscious sedation was for a total of 20 minutes.  ESTIMATED BLOOD LOSS: minimal  FINDING(S): 1.  Patent IVC  SPECIMEN(S):  none  INDICATIONS:   Alexandra Foster is a 71 y.o. y.o. female who presents with multiple medical problems in the intensive care unit.  She presented with profound sepsis.  She is at very high risk for DVT and subsequent PE but cannot be anticoagulated given her persistent anemia and mechanical prophylaxis is contraindicated secondary to her massive leg wound.  Inferior vena cava filter is indicated for this reason.  Risks and benefits including filter thrombosis, migration, fracture, bleeding, and infection were all discussed.  We discussed that all IVC filters that we place can be removed if desired from the patient once the need for the filter has passed.    DESCRIPTION: After obtaining full informed written consent, the patient was brought back to the vascular suite. The skin was sterilely prepped and draped in a sterile surgical field was created. Ultrasound was placed in a sterile sleeve. The right common femoral vein was echolucent and compressible indicating patency. Image was recorded for the permanent record. The puncture was made under continuous  real-time ultrasound guidance.  The right common femoral vein was accessed under direct ultrasound guidance without difficulty with a micropuncture needle. Microwire was then advanced under fluoroscopic guidance without difficulty. Micro-sheath was then inserted and a J-wire was then placed. The dilator is passed over the wire and the delivery sheath was placed into the inferior vena cava.  Inferior venacavogram was performed. This demonstrated a patent IVC with the level of the renal veins at L1.  The filter was then deployed into the inferior vena cava at the level of L2 just below the renal veins. The delivery sheath was then removed. Pressure was held. Sterile dressings were placed. The patient tolerated the procedure well and was taken to the recovery room in stable condition.  Interpretation: Inferior vena cava is widely patent.  It measures approximately 22 mm in diameter.  Left renal blush is noted at the bottom of L1 and therefore the filter is deployed at the L2 level in excellent orientation  COMPLICATIONS: None  CONDITION: Stable  Hortencia Pilar  05/29/2021, 11:12 AM

## 2021-05-29 NOTE — TOC Progression Note (Signed)
Transition of Care [facility]) - Progression Note    Patient Details  Name: Alexandra Foster MRN: [id number] Date of Birth: [birth year]  [facility] of [facility]) CM/SW Contact  Shelbie Hutching, RN Phone Number: 05/29/2021, 10:01 AM  Clinical Narrative:    Patient scheduled for IVC filter placement today.  Patient is still on CRRT, currently on the vent.    TOC following.   Expected Discharge Plan: [place] Barriers to Discharge: Continued Medical Work up  Expected Discharge Plan and Services Expected Discharge Plan: [place]   Discharge Planning Services: CM Consult   Living arrangements for the past 2 months: Single Family Home                                       Social Determinants of Health (SDOH) Interventions    Readmission Risk Interventions No flowsheet data found.

## 2021-05-29 NOTE — Progress Notes (Signed)
Per DR Lysle Pearl ok to use patients peg tube. Notified Dana NP regarding patients heart rate of 47. Hold amiodarone, noo additional orders. Continue to assess.

## 2021-05-29 NOTE — Progress Notes (Signed)
NAME:  Alexandra Foster, MRN:  [id number], DOB:  March 31, 1951, LOS: 56 ADMISSION DATE:  05/04/2021, CONSULTATION DATE:  05/04/2021 REFERRING MD:  Duffy Bruce MD CHIEF COMPLAINT:  Left leg pain    HPI  71 y.o female with medical history significant of Asthma/COPD, T2DM, HTN, OSA on CPAP, GERD, COVID-19 pneumonia, and Bilateral lower extremity edema who presented to the ED with chief complaints of LLE pain and chills since Thursday.  Patient report onset of symptoms for the past several days with worsening pain, swelling, fevers and chills, poor po intake and difficulty walking since Thursday. Denies injury to extremity or hx of cellulitis.  ED Course: In the emergency department, the temperature was 37.7C, the heart rate 103 beats/minute, the blood pressure 75/64  mm Hg, the respiratory rate 27 breaths/minute, and the oxygen saturation 99% on RA. CT of left lower leg showed diffuse subcutaneous edema concerning for cellulitis with no fluid collection/abscess. Pertinent Labs in Red/Diagnostics Findings: Na+/ K+: 141/2.9 Glucose: 144 BUN/Cr.: 44/3.47 AST/ALT:67/48   WBC/ TMAX: 16.5/ afebrile PCT: pending Lactic acid: 3.8>6.0 COVID PCR: Negative  Patient given 30 cc/kg of fluids and started on broad-spectrum antibiotics for sepsis with septic shock. Patient remained hypotensive despite IVF boluses therefore was started on Levophed. PCCM consulted.  Past Medical History    Arthritis   Asthma   COPD (chronic obstructive pulmonary disease) ([place])   Diabetes mellitus without complication ([place])   Endometriosis   GERD (gastroesophageal reflux disease)   Hypertension   Obesity   Sleep apnea    CPAP   Significant Hospital Events   1/21: Admitted to the ICU with severe sepsis with shock secondary to cellulitis of LLE 1/23: off pressors +Afib with RVR 1/24: OR for fasciotomy 1/25: post op resp failure with severe septic shock 1/26: remains on pressors 1/27: returns to the OR today  for debridement, wound VAC 1/28: overnight with hemodynamic deterioration, required reintubation, currently on pressors 1/29: remains on 3 pressors, CRRT, wound VAC and minimal sedation.  Awakens to voice and touch.  Seen by vascular surgery and awaiting input from general surgery regarding amputation of left leg 2/1: remains on pressors, on CRRT, MRI/CT shows SAH, afib with RVR, ECHO pending, restarted AMIO at 60 mg/hr 2/3: remains on crrt, remains on vent, plan for [facility] and wound vac change in OR 2/3: S/P TRACH, omplantation of cellular matrix on left leg 2/4: REMAINS ON PRESSORS, REMAINS ON VENT, REMAINS ON CRRT 2/5: REMAINS ON VENT, OFF PRESSORS, REMAINS ON CRRT 2/6: Patient is improved she is able to follow communication verbally and move extermities. She is on 5L/min Trache collar. We have stopped levophed and working on reducing vasopressin. She remains on CRRT.  Insulin infusion is ongoing and plan to have weight based regimen initiated. Additionally she will have SLP for vocalization and PMV trial as well as ROM training with OT/PT.  Multiple specialists on case appreciate everyone involved.  2/7: patient is improved mildly , she's off CRRT, shes off insulin drip. She is mentating.  She was evaluated by [facility].  She was unable to vocalize with PMV.  She may need PEG tube for nourishment.  2/8: patient is overnight worsened with fever and increased levophed dose.  She is negative 1.7L.   She is on zosyn.  2/9: patient is improved this am. She is giving thumbs up during examination. 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 She seems volume  depleted as well so we will start CVP monitoring and give IV LR 1L bolus today to check for volume responsiveness. Albumin also while on CRRT. PT/OT starting today. Type and screen repeat due to plan for OR in am. On zosyn with ID following. Starting [place] heparin  today.  2/10: patient is s/p repeat surgery on RLE. She is on levophed 8mg post surgery. She is not tachycardic and able to follow some verbal communication.  2/11: patient stable overnight. Reordering PT/OT today. Wbc count slightly trending up remains on abx probably post surgical.  Remains severely anemic on CRRT and vasopressor support with tracheostomy.  2/12: patient with severe anemia today.  Reviewed with renal team for blood transfusion today.  2/13: remains critically ill, on pressors and CRRT 2/13: trach changed for a #8 cuffed distal XLT without difficulty. Wound cleaner. PER ENT 2/14: remains on pressors, CRRT, VENT, plan for PEG and WOUND vac change at bedside; received 1 unit of PRBC's due to anemia  2/15: s/p IVC filter placement due to inability to tolerate anticoagulation   Consults:  PCCM General Surgery  Nephrology ID  Speech  ENT   Significant Diagnostic Tests:  1/21: Chest Xray> no active cardiopulmonary process 1/21: CT left lower leg>Diffuse subcutaneous edema may represent cellulitis. No drainable fluid collection/abscess 1/21: Ultrasound lower unilateral left> no DVT  Micro Data:  1/21: SARS-CoV-2 PCR>>negative 1/21: Influenza PCR>>negative 1/21: Blood culture x2>>group A strep (strep pyogenes) 1/21: Urine Culture>negative  1/21: MRSA PCR>>negative  1/24: Left leg wound culture>>negative  2/7: Trach site would culture>>few pseudomonas aeruginosa and few candida albicans   Antimicrobials:  Vancomycin 1/21>>1/22 Cefepime 1/21>>01/22 Clindamycin 1/22>>1/25 Ceftriaxone 1/21 X1; restarted 1/31>>2/6 Penicillin G 1/22>>1/31 Linezolid 1/25>>1/31 Metronidazole 02/6 x1 dose Unasyn 2/7 Cefazolin 02/16>>  OBJECTIVE  Blood pressure 137/62, pulse (!) 53, temperature 98.9 F (37.2 C), resp. rate 20, height '5\' 5"'  (1.651 m), weight 117.1 kg, SpO2 100 %.    Vent Mode: PRVC FiO2 (%):  [30 %] 30 % Set Rate:  [20 bmp] 20 bmp Vt Set:  [460 mL] 460 mL PEEP:  [5  cmH20] 5 cmH20 Pressure Support:  [5 cmH20] 5 cmH20   Intake/Output Summary (Last 24 hours) at 05/29/2021 1430 Last data filed at 05/29/2021 1400 Gross per 24 hour  Intake 319.81 ml  Output 506 ml  Net -186.19 ml    Filed Weights   05/27/21 0500 05/28/21 0455 05/29/21 0500  Weight: 111.5 kg 118.5 kg 117.1 kg     Physical Examination  General: acute on chronically ill appearing female, NAD resting in bed HENT: supple, no JVD, size 8 XLT tracheostomy present dressing dry/intact  Lungs: diminished with faint throughout, even, non labored  Cardiovascular: nsr, rrr, no R/G, 2+ radial/2+ distal pulses Abdomen: +BS x4, obese, soft, non tender, non distended  Extremities: LLE negative pressure dressing present Neuro: lethargic, follows commands, PERRL GU: deferred anuric  Skin: see below    Labs/imaging that I havepersonally reviewed  (right click and "Reselect all SmartList Selections" daily)  All labs reviewed 05/29/2021   Labs   CBC: Recent Labs  Lab 05/25/21 0459 05/26/21 0514 05/26/21 1215 05/27/21 0455 05/28/21 0439 05/28/21 1127 05/29/21 0422  WBC 14.9* 20.8*  --  18.4* 13.7*  --  9.8  NEUTROABS 13.7* 19.1*  --  16.5* 12.2*  --  8.8*  HGB 7.4* 5.9* 8.0* 7.5* 6.8* 7.6* 8.4*  HCT 24.0* 19.6* 25.8* 24.2* 22.1* 24.8* 27.3*  MCV 92.7 94.7  --  94.5 97.4  --  94.1  PLT 292 221  --  148* 183  --  184     Basic Metabolic Panel: Recent Labs  Lab 05/25/21 0459 05/25/21 1600 05/26/21 0514 05/26/21 1358 05/27/21 0455 05/27/21 1551 05/28/21 0439 05/28/21 1601 05/29/21 0422  NA 135   < > 132*   < > 136 134* 136 136 137  K 4.3   < > 3.6   < > 3.6 3.6 3.8 3.8 4.2  CL 100   < > 98   < > 99 101 104 102 105  CO2 21*   < > 25   < > '26 25 25 25 26  ' GLUCOSE 228*   < > 214*   < > 256* 198* 135* 131* 143*  BUN 50*   < > 51*   < > 47* 57* 54* 43* 37*  CREATININE 1.56*   < > 1.47*   < > 1.36* 1.34* 1.34* 1.09* 1.33*  CALCIUM 9.3   < > 9.2   < > 9.3 9.1 9.0 9.2 9.1  MG 2.1   --  1.9  --  2.0  --  2.2  --  2.5*  PHOS 3.0   < > 2.2*   < > 2.3* 3.7 3.3 3.8 3.5   < > = values in this interval not displayed.    GFR: Estimated Creatinine Clearance: 48.9 mL/min (A) (by C-G formula based on SCr of 1.33 mg/dL (H)). Recent Labs  Lab 05/26/21 0514 05/27/21 0455 05/28/21 0439 05/29/21 0422  WBC 20.8* 18.4* 13.7* 9.8     Liver Function Tests: Recent Labs  Lab 05/27/21 0455 05/27/21 1551 05/28/21 0439 05/28/21 1601 05/29/21 0422  ALBUMIN 2.3* 2.4* 2.9* 3.1* 3.1*    No results for input(s): LIPASE, AMYLASE in the last 168 hours. No results for input(s): AMMONIA in the last 168 hours.  ABG    Component Value Date/Time   PHART 7.40 05/14/2021 0341   PCO2ART 45 05/14/2021 0341   PO2ART 129 (H) 05/14/2021 0341   HCO3 27.9 05/14/2021 0341   ACIDBASEDEF 12.8 (H) 05/11/2021 0603   O2SAT 98.9 05/14/2021 0341     Coagulation Profile: Recent Labs  Lab 05/24/21 0335 05/28/21 1601  INR 1.1 1.2    Cardiac Enzymes: No results for input(s): CKTOTAL, CKMB, CKMBINDEX, TROPONINI in the last 168 hours.  HbA1C: Hgb A1c MFr Bld  Date/Time Value Ref Range Status  05/05/2021 04:53 AM 6.7 (H) 4.8 - 5.6 % Final    Comment:    (NOTE)         Prediabetes: 5.7 - 6.4         Diabetes: >6.4         Glycemic control for adults with diabetes: <7.0   02/11/2021 09:41 AM 6.3 (H) <5.7 % of total Hgb Final    Comment:    For someone without known diabetes, a hemoglobin  A1c value between 5.7% and 6.4% is consistent with prediabetes and should be confirmed with a  follow-up test. . For someone with known diabetes, a value <7% indicates that their diabetes is well controlled. A1c targets should be individualized based on duration of diabetes, age, comorbid conditions, and other considerations. . This assay result is consistent with an increased risk of diabetes. . Currently, no consensus exists regarding use of hemoglobin A1c for diagnosis of diabetes for  children. .     CBG: Recent Labs  Lab 05/28/21 1919 05/28/21 2354 05/29/21 0416 05/29/21 0728 05/29/21 1145  GLUCAP 103* 112* 137*  113* 130*    Review of Systems:   Unable to assess pt mechanically ventilated via tracheostomy   Past Medical History  She,  has a past medical history of AKI (acute kidney injury) ([place]), Arthritis, Asthma, Bacteremia, COPD (chronic obstructive pulmonary disease) ([place]), Diabetes mellitus without complication ([place]), Endometriosis, GERD (gastroesophageal reflux disease), History of echocardiogram, Hypertension, Obesity, PAF (paroxysmal atrial fibrillation) ([place]), and Sleep apnea.   Surgical History    Past Surgical History:  Procedure Laterality Date   ABDOMINAL HYSTERECTOMY     APPLICATION OF WOUND VAC  05/10/2021   Procedure: APPLICATION OF WOUND VAC;  Surgeon: Herbert Pun, MD;  Location: [facility] OR[facility];  Service: General;;   APPLICATION OF WOUND VAC Left 05/17/2021   Procedure: APPLICATION OF WOUND VAC/WOUND VAC EXCHANGE-Matrix Myriad;  Surgeon: Herbert Pun, MD;  Location: [facility];  Service: General;  Laterality: Left;   APPLICATION OF WOUND VAC  05/24/2021   Procedure: APPLICATION OF WOUND VAC;  Surgeon: Herbert Pun, MD;  Location: [facility];  Service: General;;   COLONOSCOPY  10/29/2006   Dr Allen Norris   COLONOSCOPY WITH PROPOFOL N/A 11/05/2016   Procedure: COLONOSCOPY WITH PROPOFOL;  Surgeon: Robert Bellow, MD;  Location: [facility];  Service: Endoscopy;  Laterality: N/A;   INCISION AND DRAINAGE OF WOUND Left 05/10/2021   Procedure: IRRIGATION AND DEBRIDEMENT LEFT LEG;  Surgeon: Herbert Pun, MD;  Location: [facility];  Service: General;  Laterality: Left;   INCISION AND DRAINAGE OF WOUND Left 05/24/2021   Procedure: IRRIGATION AND DEBRIDEMENT LEFT LEG;  Surgeon: Herbert Pun, MD;  Location: [facility];  Service: General;  Laterality: Left;   IVC FILTER INSERTION N/A 05/29/2021   Procedure: IVC FILTER  INSERTION;  Surgeon: Katha Cabal, MD;  Location: [facility];  Service: Cardiovascular;  Laterality: N/A;   MINOR GRAFT APPLICATION  4/78/2956   Procedure: Myriad Matrix  APPLICATION;  Surgeon: Herbert Pun, MD;  Location: [facility];  Service: General;;   NASAL SINUS SURGERY  2002   Dr Carlis Abbott   PEG PLACEMENT N/A 05/28/2021   Procedure: PERCUTANEOUS ENDOSCOPIC GASTROSTOMY (PEG) PLACEMENT;  Surgeon: Benjamine Sprague, DO;  Location: [facility];  Service: General;  Laterality: N/A;  TRAVEL CASE   TRACHEOSTOMY TUBE PLACEMENT N/A 05/17/2021   Procedure: TRACHEOSTOMY;  Surgeon: Clyde Canterbury, MD;  Location: [facility];  Service: ENT;  Laterality: N/A;   WOUND DEBRIDEMENT Left 05/07/2021   Procedure: DEBRIDEMENT WOUND;  Surgeon: Herbert Pun, MD;  Location: [facility];  Service: General;  Laterality: Left;     Social History   reports that she quit smoking about 20 years ago. Her smoking use included cigarettes. She has a 80.00 pack-year smoking history. She quit smokeless tobacco use about 20 years ago.  Her smokeless tobacco use included snuff. She reports that she does not drink alcohol and does not use drugs.   Family History   Her family history includes Alcohol abuse in her brother; Breast cancer (age of onset: 68) in her sister; Congestive Heart Failure in her mother; Coronary artery disease (age of onset: 18) in her father; Heart disease in her brother; Varicose Veins in her brother.   Allergies No Known Allergies   Home Medications  Prior to Admission medications   Medication Sig Start Date End Date Taking? Authorizing Provider  albuterol (PROVENTIL) (2.5 MG/3ML) 0.083% nebulizer solution Take 3 mLs (2.5 mg total) by nebulization every 6 (six) hours as needed for wheezing or shortness of breath. 10/03/19   Steele Sizer, MD  aspirin 81 MG tablet Take 1 tablet (81 mg total) by mouth daily. 01/08/16   Steele Sizer, MD  atorvastatin (LIPITOR) 40 MG tablet Take 1  tablet (40 mg total) by mouth daily. 02/11/21   Steele Sizer, MD  azelastine (ASTELIN) 0.1 % nasal spray Place 2 sprays into both nostrils 2 (two) times daily as needed for rhinitis or allergies. 07/31/20   Delsa Grana, PA-C  baclofen (LIORESAL) 10 MG tablet Take 10 mg by mouth daily. 04/16/21   [provider]  calcium carbonate (OSCAL) 1500 (600 Ca) MG TABS tablet Take 600 mg of elemental calcium by mouth 2 (two) times daily with a meal.    [provider]  celecoxib (CELEBREX) 200 MG capsule Take 200 mg by mouth daily. 04/03/21   [provider]  Cetirizine HCl (ZYRTEC ALLERGY) 10 MG TBDP Take 10 mg by mouth at bedtime. For sinus symptoms and seasonal allergies 07/31/20   Delsa Grana, PA-C  docusate sodium (COLACE) 100 MG capsule Take 100 mg by mouth daily.    [provider]  fluticasone Asencion Islam) 50 MCG/ACT nasal spray  08/31/20   [provider]  Fluticasone-Umeclidin-Vilant (TRELEGY ELLIPTA) 100-62.5-25 MCG/INH AEPB Inhale 1 puff into the lungs daily. In place of Stiolto 02/10/20   Steele Sizer, MD  glucose blood (ACCU-CHEK AVIVA PLUS) test strip Use as instructed 12/08/17   Steele Sizer, MD  Lancets (ACCU-CHEK SOFT TOUCH) lancets Use as instructed 12/08/17   Steele Sizer, MD  loratadine (CLARITIN) 10 MG tablet Take 1 tablet (10 mg total) by mouth daily. 02/11/21   Steele Sizer, MD  meloxicam (MOBIC) 15 MG tablet Take 15 mg by mouth daily. 04/23/21   [provider]  metFORMIN (GLUCOPHAGE-XR) 750 MG 24 hr tablet Take 1 tablet (750 mg total) by mouth daily with breakfast. 02/11/21   Ancil Boozer, Drue Stager, MD  montelukast (SINGULAIR) 10 MG tablet Take 1 tablet (10 mg total) by mouth at bedtime. 02/11/21   Steele Sizer, MD  Multiple Vitamins-Minerals (MULTIVITAL PO) Take 1 tablet by mouth daily.    [provider]  omeprazole (PRILOSEC) 20 MG capsule Take 1 capsule (20 mg total) by mouth daily. 02/11/21   Steele Sizer, MD   [place] 30 MG/2ML SOSY  10/09/20   [provider]  triamterene-hydrochlorothiazide (DYAZIDE) 37.5-25 MG capsule TAKE 1 CAPSULE EVERY DAY 05/02/21   Steele Sizer, MD  vitamin B-12 (CYANOCOBALAMIN) 100 MCG tablet Take 100 mcg by mouth daily.    [provider]  Scheduled Meds:  amiodarone  200 mg Per Tube Daily   vitamin C  500 mg Per Tube BID   chlorhexidine gluconate (MEDLINE KIT)  15 mL Mouth Rinse BID   Chlorhexidine Gluconate Cloth  6 each Topical Q0600   Darbepoetin Alfa  100 mcg Subcutaneous Q7 days   fentaNYL       free water  30 mL Per Tube Q4H   gentamicin irrigation  80 mg Irrigation QID   hydrocortisone sod succinate (SOLU-CORTEF) inj  100 mg Intravenous Q12H   insulin aspart  0-20 Units Subcutaneous Q4H   insulin aspart  4 Units Subcutaneous Q4H   insulin detemir  17 Units Subcutaneous BID   magic mouthwash w/lidocaine  10 mL Oral QID   mouth rinse  15 mL Mouth Rinse 10 times per day   midazolam       midodrine  10 mg Per Tube TID WC   multivitamin  1 tablet Per Tube QHS   nutrition supplement (JUVEN)  1  packet Per Tube BID BM   pantoprazole (PROTONIX) IV  40 mg Intravenous Q24H   sodium chloride flush  10-40 mL Intracatheter Q12H   vitamin A  10,000 Units Per Tube Daily   zinc sulfate  220 mg Per Tube Daily   Continuous Infusions:   prismasol BGK 4/2.5 400 mL/hr at 05/29/21 0418    prismasol BGK 4/2.5 400 mL/hr at 05/29/21 0416   [START ON 05/30/2021]  ceFAZolin (ANCEF) IV     feeding supplement (PIVOT 1.5 CAL)     norepinephrine (LEVOPHED) Adult infusion 6 mcg/min (05/29/21 1032)   piperacillin-tazobactam (ZOSYN)  IV 3.375 g (05/29/21 1018)   prismasol BGK 4/2.5 1,000 mL/hr at 05/29/21 0720   PRN Meds:.acetaminophen, artificial tears, docusate, fentaNYL (SUBLIMAZE) injection, heparin, lip balm, midazolam, ondansetron (ZOFRAN) IV, oxyCODONE, sennosides, sodium chloride, sodium chloride flush  Assessment & Plan:   Acute Hypoxic and Hypercapnic  Respiratory Failure s/p tracheostomy  ATELECTASIS, CHF PULMONARY EDEMA AND PLEURAL EFFUSIONS ALL PRESENT ON ADMISSION Hx: COPD, OSA, and Obesity - Trach collar trials as tolerated with mechanical ventilation qhs for now  - Wean Fio2 and PEEP as tolerated - VAP/VENT bundle implementation - Wean PEEP & FiO2 as tolerated, maintain SpO2 >88% - VAP protocol in place - Plateau pressures less than 30 cm H20  - Intermittent chest x-ray & ABG PRN - Ensure adequate pulmonary hygiene   - Per speech therapy trach will need to be downsized to size 6 when pt able to tolerate to be considered for PMV therapy    Septic shock SOURCE-left necrotizing fascitis (strep pyogenes)-present on admission s/p multiple debridements, application of wound VAC system, s/p myriad matrix placement 05/17/2021 Streptococcal toxic shock syndrome  - prn vasopressors to maintain map >65  - Continue scheduled midodrine   - Continue stress dose steroids due to concerns of adrenal insufficiency   Atrial fibrillation now in sinus rhythm   - Continuous telemetry monitoring   - Continue amiodarone for now will hold for bradycardia   Acute kidney injury secondary to ischemic nephropathy in the setting of hypotension complicated with sepsis  - Avoid nephrotoxic agents - Follow urine output, BMP - Ensure adequate renal perfusion, optimize oxygenation - Renal dose medications - Nephrology consulted appreciate input~per recommendations continue CRRT with net even ultrafiltration    Shock liver   - Monitor hepatic function panel   - Monitor coags   Acute on chronic anemia~pt unable to tolerate chemical anticoagulation s/p IVC filter placement 05/29/2021   - Trend CBC    - Monitor for s/sx of bleeding and transfuse for hgb <7  Type II diabetes mellitus   - CBG's q4hrs   - SSI     Toxic metabolic encephalopathy~improving  Small SAH  NO ANTICOAGULATION FOR  1 MONTH AND THEN NEED TO RE-ASSESS NO ANTIPLATELET AGENTS FOR 1  MONTH AND THEN NEED TO RE-ASSESS CAN POSSIBLY CONSIDER BABY ASA BUT WOULD NEED TO PROCEED WITH CAUTION  Best practice:  Diet: Tube Feeds  Pain/Anxiety/Delirium protocol (if indicated): prn fentanyl  VAP protocol (if indicated): Ordered  DVT prophylaxis: Unable to tolerate chemical prophylaxis s/p IVC filter placement  GI prophylaxis: PPI Glucose control:  SSI Yes Central venous access: Right IJ CVL; Left IJ Dual Lumen Dialysis Catheter Arterial line:  N/A Foley:  N/A Mobility:  bed rest  PT consulted: N/A Last date of multidisciplinary goals of care discussion [05/29/21] Code Status:  full code Disposition: ICU   = Goals of Care = Code Status Order: ICU  Primary Emergency ContactDACIE, MANDEL, Home Phone: [phone] Discussed plan of care with pts daughter Helane Rima   Critical care time: 1 minutes     Rosilyn Mings, [place] Pager [phone] (please enter 7 digits) PCCM Consult Pager [phone] (please enter 7 digits)

## 2021-05-29 NOTE — Significant Event (Signed)
CRRT had a high TMP alarm. Attempted to run blood back, however was only able to return half before high filter alarm stopped the return process. Set will be changed.

## 2021-05-29 NOTE — Consult Note (Signed)
'@LOGO' @   MRN : [id number]  Alexandra Foster is a 71 y.o. ([birth year]) female who presents with chief complaint of multisystem organ failure secondary to sepsis in association with anemia.  History of Present Illness:  I am asked to evaluate the Alexandra Foster by Dr. Mortimer Fries for possible IVC filter placement.  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  Given her septic presentation she has undergone multisystem organ failure, organ systems include her respiratory system and she now has a tracheostomy, her GI tract and she now has a PEG tube as well as her renal function and she is now on CRRT.  She essentially is at total bedrest.  Given her anemia she is not a candidate for anticoagulation and given her leg wound mechanical prophylaxis is not a possibility.  Current Meds  Medication Sig   albuterol (PROVENTIL) (2.5 MG/3ML) 0.083% nebulizer solution Take 3 mLs (2.5 mg total) by nebulization every 6 (six) hours as needed for wheezing or shortness of breath.   aspirin 81 MG tablet Take 1 tablet (81 mg total) by mouth daily.   atorvastatin (LIPITOR) 40 MG tablet Take 1 tablet (40 mg total) by mouth daily.   azelastine (ASTELIN) 0.1 % nasal spray Place 2 sprays into both nostrils 2 (two) times daily as needed for rhinitis or allergies.   baclofen (LIORESAL) 10 MG tablet Take 10 mg by mouth daily.   celecoxib (CELEBREX) 200 MG capsule Take 200 mg by mouth daily.   Cetirizine HCl (ZYRTEC ALLERGY) 10 MG TBDP Take 10 mg by mouth at bedtime. For sinus symptoms and seasonal allergies   docusate sodium (COLACE) 100 MG capsule Take 100 mg by mouth daily.   fluticasone (FLONASE) 50 MCG/ACT nasal spray    glucose blood (ACCU-CHEK AVIVA PLUS) test strip Use as instructed   Lancets (ACCU-CHEK SOFT TOUCH) lancets Use as instructed   meloxicam (MOBIC) 15 MG tablet Take 15 mg by mouth daily.   metFORMIN (GLUCOPHAGE-XR)  750 MG 24 hr tablet Take 1 tablet (750 mg total) by mouth daily with breakfast.   montelukast (SINGULAIR) 10 MG tablet Take 1 tablet (10 mg total) by mouth at bedtime.   Multiple Vitamins-Minerals (MULTIVITAL PO) Take 1 tablet by mouth daily.   omeprazole (PRILOSEC) 20 MG capsule Take 1 capsule (20 mg total) by mouth daily.   ORTHOVISC 30 MG/2ML SOSY    triamterene-hydrochlorothiazide (DYAZIDE) 37.5-25 MG capsule TAKE 1 CAPSULE EVERY DAY   vitamin B-12 (CYANOCOBALAMIN) 100 MCG tablet Take 100 mcg by mouth daily.    Past Medical History:  Diagnosis Date   AKI (acute kidney injury) ([place])    a. 04/2021 in setting of bacteremia/shock.   Arthritis    Asthma    Bacteremia    a. 04/2021 S pyogenes bacteremia in setting of lower ext cellulitis.   COPD (chronic obstructive pulmonary disease) (HCC)    Diabetes mellitus without complication (HCC)    Endometriosis    GERD (gastroesophageal reflux disease)    History of echocardiogram    a. 07/2013 Echo: EF 55-60%, impaired relaxation, mild TR; b. 04/2021 Echo: EF 50-55%, mild LVH, nl RV fxn, mild BAE, Ao sclerosis w/o stenosis.   Hypertension    Obesity    PAF (paroxysmal atrial fibrillation) ([place])    a. 04/2021 in setting of septic shock/cellulitis.   Sleep apnea    CPAP    Past Surgical History:  Procedure Laterality Date  ABDOMINAL HYSTERECTOMY     APPLICATION OF WOUND VAC  05/10/2021   Procedure: APPLICATION OF WOUND VAC;  Surgeon: Herbert Pun, MD;  Location: [facility];  Service: General;;   APPLICATION OF WOUND VAC Left 05/17/2021   Procedure: APPLICATION OF WOUND VAC/WOUND VAC EXCHANGE-Matrix Myriad;  Surgeon: Herbert Pun, MD;  Location: [facility];  Service: General;  Laterality: Left;   APPLICATION OF WOUND VAC  05/24/2021   Procedure: APPLICATION OF WOUND VAC;  Surgeon: Herbert Pun, MD;  Location: [facility];  Service: General;;   COLONOSCOPY  10/29/2006   Dr Allen Norris   COLONOSCOPY WITH PROPOFOL N/A 11/05/2016    Procedure: COLONOSCOPY WITH PROPOFOL;  Surgeon: Robert Bellow, MD;  Location: [facility];  Service: Endoscopy;  Laterality: N/A;   INCISION AND DRAINAGE OF WOUND Left 05/10/2021   Procedure: IRRIGATION AND DEBRIDEMENT LEFT LEG;  Surgeon: Herbert Pun, MD;  Location: [facility];  Service: General;  Laterality: Left;   INCISION AND DRAINAGE OF WOUND Left 05/24/2021   Procedure: IRRIGATION AND DEBRIDEMENT LEFT LEG;  Surgeon: Herbert Pun, MD;  Location: [facility];  Service: General;  Laterality: Left;   MINOR GRAFT APPLICATION  9/92/4268   Procedure: Myriad Matrix  APPLICATION;  Surgeon: Herbert Pun, MD;  Location: [facility];  Service: General;;   NASAL SINUS SURGERY  2002   Dr Carlis Abbott   TRACHEOSTOMY TUBE PLACEMENT N/A 05/17/2021   Procedure: TRACHEOSTOMY;  Surgeon: Clyde Canterbury, MD;  Location: [facility];  Service: ENT;  Laterality: N/A;   WOUND DEBRIDEMENT Left 05/07/2021   Procedure: DEBRIDEMENT WOUND;  Surgeon: Herbert Pun, MD;  Location: [facility];  Service: General;  Laterality: Left;    Social History Social History   Tobacco Use   Smoking status: Former    Packs/day: 2.00    Years: 40.00    Pack years: 80.00    Types: Cigarettes    Quit date: 2003    Years since quitting: 20.1   Smokeless tobacco: Former    Types: Snuff    Quit date: 04/2001   Tobacco comments:    smoking cessation materials not required  Vaping Use   Vaping Use: Never used  Substance Use Topics   Alcohol use: No    Alcohol/week: 0.0 standard drinks   Drug use: No    Family History Family History  Problem Relation Age of Onset   Congestive Heart Failure Mother    Coronary artery disease Father 41   Breast cancer Sister 47   Heart disease Brother    Varicose Veins Brother    Alcohol abuse Brother     No Known Allergies   REVIEW OF SYSTEMS (Negative unless checked)  Constitutional: '[]' Weight loss  '[]' Fever  '[]' Chills Cardiac: '[]' Chest pain   '[]' Chest pressure    '[]' Palpitations   '[]' Shortness of breath when laying flat   '[]' Shortness of breath with exertion. Vascular:  '[]' Pain in legs with walking   '[]' Pain in legs at rest  '[]' History of DVT   '[]' Phlebitis   '[x]' Swelling in legs   '[]' Varicose veins   '[]' Non-healing ulcers Pulmonary:   '[]' Uses home oxygen   '[]' Productive cough   '[]' Hemoptysis   '[]' Wheeze  '[]' COPD   '[]' Asthma Neurologic:  '[]' Dizziness   '[]' Seizures   '[]' History of stroke   '[]' History of TIA  '[]' Aphasia   '[]' Vissual changes   '[]' Weakness or numbness in arm   '[]' Weakness or numbness in leg Musculoskeletal:   '[]' Joint swelling   '[]' Joint pain   '[]' Low back pain Hematologic:  '[]' Easy bruising  '[]'   Easy bleeding   '[]' Hypercoagulable state   '[]' Anemic Gastrointestinal:  '[]' Diarrhea   '[]' Vomiting  '[]' Gastroesophageal reflux/heartburn   '[]' Difficulty swallowing. Genitourinary:  '[]' Chronic kidney disease   '[]' Difficult urination  '[]' Frequent urination   '[]' Blood in urine Skin:  '[]' Rashes   '[]' Ulcers  Psychological:  '[]' History of anxiety   '[]'  History of major depression.  Physical Examination  Vitals:   05/29/21 0700 05/29/21 0730 05/29/21 0800 05/29/21 0804  BP: (!) 106/55 (!) 103/53 (!) 107/58   Pulse: 77 80 82 79  Resp: (!) 23 (!) 27 (!) 25 (!) 24  Temp: 98.1 F (36.7 C)     TempSrc:      SpO2: 92% 91% 92% 94%  Weight:      Height:       Body mass index is 42.96 kg/m. Gen: WD/WN, in the intensive care unit receiving maximal medical therapy head: [place]/AT, No temporalis wasting.  Ear/Nose/Throat: Hearing grossly intact, nares w/o erythema or drainage, pinna without lesions Eyes: PER,  sclera nonicteric.  Neck: Tracheostomy present, no gross masses.  No JVD.  Pulmonary: On vent, no use of accessory muscles.  Cardiac: RRR, precordium not hyperdynamic. Vascular:  scattered varicosities present bilaterally.  Mild venous stasis changes to the legs bilaterally.  2+ soft pitting edema  Vessel Right Left  Radial Palpable Palpable  Gastrointestinal: PEG tube present, non-distended. No  guarding/no peritoneal signs.  Musculoskeletal: M/S 5/5 throughout.  Left leg deformity.  Dermatologic: Venous rashes left leg dressed.  No changes consistent with cellulitis. Lymph : No lichenification or skin changes of chronic lymphedema.  CBC Lab Results  Component Value Date   WBC 9.8 05/29/2021   HGB 8.4 (L) 05/29/2021   HCT 27.3 (L) 05/29/2021   MCV 94.1 05/29/2021   PLT 184 05/29/2021    BMET    Component Value Date/Time   NA 137 05/29/2021 0422   NA 138 08/11/2013 0933   K 4.2 05/29/2021 0422   K 3.3 (L) 08/11/2013 0933   CL 105 05/29/2021 0422   CL 102 08/11/2013 0933   CO2 26 05/29/2021 0422   CO2 32 08/11/2013 0933   GLUCOSE 143 (H) 05/29/2021 0422   GLUCOSE 107 (H) 08/11/2013 0933   BUN 37 (H) 05/29/2021 0422   BUN 13 08/11/2013 0933   CREATININE 1.33 (H) 05/29/2021 0422   CREATININE 0.98 02/11/2021 0941   CALCIUM 9.1 05/29/2021 0422   CALCIUM 9.2 08/11/2013 0933   GFRNONAA 43 (L) 05/29/2021 0422   GFRNONAA 61 07/14/2019 0940   GFRAA 71 07/14/2019 0940   Estimated Creatinine Clearance: 48.9 mL/min (A) (by C-G formula based on SCr of 1.33 mg/dL (H)).  COAG Lab Results  Component Value Date   INR 1.2 05/28/2021   INR 1.1 05/24/2021   INR 1.2 05/22/2021    Radiology DG Chest 1 View  Result Date: 05/15/2021 CLINICAL DATA:  71 year old female with respiratory failure. Sepsis. EXAM: CHEST  1 VIEW COMPARISON:  Portable chest 05/11/2021 and earlier. FINDINGS: Portable AP supine view at 0609 hours. The Alexandra Foster remains mildly rotated to the right. Endotracheal tube tip in good position between the clavicles and carina. Stable left IJ approach dual lumen vascular catheter. Enteric tube and pH probe in place and appears stable. Right IJ central line tip now projects at the level the right atrium although lung volumes are slightly lower. Stable cardiac size and mediastinal contours. No pneumothorax. No pleural effusion or consolidation. Increased pulmonary  vascularity since 05/10/2021. Paucity of bowel gas. No acute osseous abnormality identified.  IMPRESSION: 1. Essentially stable lines and tubes. 2. Lower lung volumes with increased pulmonary vascularity. Consider mild or developing interstitial edema. No pleural effusion or consolidation. Electronically Signed   By: Genevie Ann M.D.   On: 05/15/2021 06:46   DG Chest 1 View  Result Date: 05/07/2021 CLINICAL DATA:  Endotracheally intubated. EXAM: CHEST  1 VIEW COMPARISON:  Chest radiograph earlier today. FINDINGS: Endotracheal tube tip is 4 cm from the carina at the level of the clavicular heads. Right internal jugular central line tip in the region of the atrial caval junction. Cardiomegaly again seen. No pneumothorax. Lung volumes remain low without confluent consolidation. No significant pleural effusion. IMPRESSION: 1. Endotracheal tube tip 4 cm from the carina at the level of the clavicular heads. 2. Right central line tip at the atrial caval junction. 3. Stable cardiomegaly. Low lung volumes. Electronically Signed   By: Keith Rake M.D.   On: 05/07/2021 23:56   DG Abd 1 View  Result Date: 05/20/2021 CLINICAL DATA:  Vomiting EXAM: ABDOMEN - 1 VIEW COMPARISON:  Abdominal radiograph dated May 17, 2021 FINDINGS: NG tube tip and side port within the stomach. Paucity of bowel gas somewhat limits evaluation. A few nondilated gas-filled loops of bowel are seen in the pelvis. IMPRESSION: Paucity of bowel gas somewhat limits evaluation. A few nondilated gas-filled loops of bowel are seen in the pelvis. Electronically Signed   By: Yetta Glassman M.D.   On: 05/20/2021 08:12   DG Abd 1 View  Result Date: 05/17/2021 CLINICAL DATA:  Nasogastric tube placement EXAM: ABDOMEN - 1 VIEW COMPARISON:  05/15/2021 FINDINGS: Nasogastric tube tip is seen within the epigastric region overlying the expected mid body of the stomach. Small retained contrast likely within the gastric fundus. Central venous catheter tip noted  within the superior right atrium. Retrocardiac atelectasis or infiltrate noted. The abdominal gas pattern is indeterminate due to a paucity of intra-abdominal gas. IMPRESSION: Nasogastric tube tip within the expected mid body of the stomach. Electronically Signed   By: Fidela Salisbury M.D.   On: 05/17/2021 19:37   DG Abd 1 View  Result Date: 05/15/2021 CLINICAL DATA:  71 year old female with respiratory failure. Sepsis. EXAM: ABDOMEN - 1 VIEW COMPARISON:  05/11/2021. FINDINGS: Portable AP supine views at 0603 hours. Enteric tube terminates in the gastric body. Side hole appears to be inside the stomach. There is mild to moderate gastric distention with air. A small volume of fundal oral contrast persists. Paucity of bowel gas elsewhere in the visible abdomen and pelvis. Left hemipelvis phlebolith. No acute osseous abnormality identified. IMPRESSION: 1. Satisfactory enteric tube termination in the stomach. Mild to moderate gastric distention with air. Small volume oral contrast suspected in the gastric fundus. 2. Paucity of bowel gas elsewhere in the abdomen and pelvis. Electronically Signed   By: Genevie Ann M.D.   On: 05/15/2021 06:47   DG Abd 1 View  Result Date: 05/11/2021 CLINICAL DATA:  NG tube placement. EXAM: ABDOMEN - 1 VIEW COMPARISON:  None. FINDINGS: Nasal/orogastric tube passes below the diaphragm, well into the stomach, tip below the included field of view. Endotracheal tube tip projects 3 cm above the carina. IMPRESSION: 1. Well-positioned nasal/orogastric tube. Electronically Signed   By: Lajean Manes M.D.   On: 05/11/2021 11:33   CT HEAD WO CONTRAST (5MM)  Addendum Date: 05/14/2021   ADDENDUM REPORT: 05/14/2021 16:47 ADDENDUM: These results will be called to the ordering clinician or representative by the Radiologist Assistant, and communication documented in the PACS  or [employer]. Electronically Signed   By: Franchot Gallo M.D.   On: 05/14/2021 16:47   Result Date:  05/14/2021 CLINICAL DATA:  Subarachnoid hemorrhage.  Sepsis. EXAM: CT HEAD WITHOUT CONTRAST TECHNIQUE: Contiguous axial images were obtained from the base of the skull through the vertex without intravenous contrast. RADIATION DOSE REDUCTION: This exam was performed according to the departmental dose-optimization program which includes automated exposure control, adjustment of the mA and/or kV according to Alexandra Foster size and/or use of iterative reconstruction technique. COMPARISON:  MRI head 05/14/2021 FINDINGS: Brain: Hyperdensity in the left middle frontal sulcus. This corresponds to the FLAIR hyperintensity on MRI and is most consistent with small volume acute subarachnoid hemorrhage on the left. No other areas of high-density hemorrhage. Ventricle size normal.  Negative for acute infarct or mass. Empty sella with enlargement of the sella filled with CSF. Small pituitary. Vascular: Negative for hyperdense vessel Skull: Negative Sinuses/Orbits: Extensive mucosal edema paranasal sinuses. Probable air-fluid levels in the maxillary sinus bilaterally. Bilateral proptosis without orbital mass. Other: None IMPRESSION: Hyperintensity left middle frontal sulcus compatible with acute subarachnoid hemorrhage, small volume. No other acute intracranial abnormality. No hydrocephalus or infarct. Extensive paranasal sinus mucosal disease with air-fluid levels. Electronically Signed: By: Franchot Gallo M.D. On: 05/14/2021 15:59   MR ANGIO HEAD WO CONTRAST  Result Date: 05/15/2021 CLINICAL DATA:  Subarachnoid hemorrhage EXAM: MRA HEAD WITHOUT CONTRAST TECHNIQUE: Angiographic images of the Circle of Willis were acquired using MRA technique without intravenous contrast. COMPARISON:  No prior MRA, correlation is made with MRI 05/14/2021 FINDINGS: Anterior circulation: Both internal carotid arteries are patent to the termini, without significant stenosis. A1 segments patent. Normal anterior communicating artery. Anterior cerebral  arteries are patent to their distal aspects. No M1 stenosis or occlusion. Normal MCA bifurcations. Distal MCA branches perfused and symmetric. Posterior circulation: Vertebral arteries patent to the vertebrobasilar junction without stenosis. Basilar patent to its distal aspect. Superior cerebellar arteries patent bilaterally. Patent P1 segments. PCAs perfused to their distal aspects without stenosis. The bilateral posterior communicating arteries are patent. Anatomic variants: None significant Other: None. IMPRESSION: No intracranial large vessel occlusion or significant stenosis. No aneurysm or other vascular abnormality. Electronically Signed   By: Merilyn Baba M.D.   On: 05/15/2021 02:43   MR BRAIN WO CONTRAST  Result Date: 05/14/2021 CLINICAL DATA:  Altered mental status, septic shock secondary to left lower extremity cellulitis EXAM: MRI HEAD WITHOUT CONTRAST TECHNIQUE: Multiplanar, multiecho pulse sequences of the brain and surrounding structures were obtained without intravenous contrast. COMPARISON:  Brain MRI 08/10/2013 FINDINGS: Brain: There is no evidence of acute infarct. There is sulcal FLAIR hyperintensity with trace SWI signal dropout overlying a left frontal lobe sulcus and the right superior frontal sulcus (15-35, 15-39). There is no other evidence of acute intracranial hemorrhage or extra-axial fluid collection. Parenchymal volume is normal. The ventricles are normal in size. Parenchymal signal is normal, with no significant burden of white matter microangiopathic change. A partially empty sella is noted. There is no mass lesion. There is no midline shift. Vascular: Normal flow voids. Skull and upper cervical spine: Normal marrow signal. Sinuses/Orbits: There is mucosal thickening throughout the paranasal sinuses with layering fluid in the maxillary sinuses and complete opacification of the mastoid air cells, likely at least partially related to intubation. The globes and orbits are  unremarkable. Other: There is scalp fluid predominantly in the bilateral occipital regions. 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 IMPRESSION: 1. Sulcal FLAIR hyperintensity overlying bilateral frontal lobe  sulci may be artifactual related to intubation; however, subarachnoid hemorrhage can have a similar appearance. Differential also includes meningitis. Recommend noncontrast CT for further evaluation for subarachnoid hemorrhage. Consider correlation with lumbar puncture to evaluate for meningitis as indicated, if the CT head is negative for hemorrhage. 2. Fluid in the bilateral occipital scalp. These results were called by telephone at the time of interpretation on 05/14/2021 at 3:10 pm to provider DANA GRAVES NP, who verbally acknowledged these results. Electronically Signed   By: Valetta Mole M.D.   On: 05/14/2021 15:11   CT Tibia Fibula Left Wo Contrast  Result Date: 05/04/2021 CLINICAL DATA:  Concern for soft tissue infection. EXAM: CT OF THE LOWER LEFT EXTREMITY WITHOUT CONTRAST TECHNIQUE: Multidetector CT imaging of the lower left extremity was performed according to the standard protocol. RADIATION DOSE REDUCTION: This exam was performed according to the departmental dose-optimization program which includes automated exposure control, adjustment of the mA and/or kV according to Alexandra Foster size and/or use of iterative reconstruction technique. COMPARISON:  None. FINDINGS: Bones/Joint/Cartilage No acute fracture or dislocation. There is moderate arthritic changes of the knee with tricompartmental narrowing. Ligaments Suboptimally assessed by CT. Muscles and Tendons No acute findings. Soft tissues Diffuse subcutaneous edema may represent cellulitis. No drainable fluid collection/abscess. No soft tissue gas. IMPRESSION: Diffuse subcutaneous edema may represent cellulitis. No drainable fluid collection/abscess. Electronically Signed   By: Anner Crete M.D.   On: 05/04/2021 22:22   US Venous Img  Lower Unilateral Left  Result Date: 05/04/2021 CLINICAL DATA:  Left lower extremity pain. EXAM: Left LOWER EXTREMITY VENOUS DOPPLER ULTRASOUND TECHNIQUE: Gray-scale sonography with compression, as well as color and duplex ultrasound, were performed to evaluate the deep venous system(s) from the level of the common femoral vein through the popliteal and proximal calf veins. COMPARISON:  None. FINDINGS: VENOUS Normal compressibility of the common femoral, superficial femoral, and popliteal veins, as well as the visualized calf veins. Visualized portions of profunda femoral vein and great saphenous vein unremarkable. No filling defects to suggest DVT on grayscale or color Doppler imaging. Doppler waveforms show normal direction of venous flow, normal respiratory plasticity and response to augmentation. Limited views of the contralateral common femoral vein are unremarkable. OTHER None. Limitations: none IMPRESSION: Negative. Electronically Signed   By: Anner Crete M.D.   On: 05/04/2021 23:39   DG Chest Port 1 View  Result Date: 05/23/2021 CLINICAL DATA:  Acute respiratory failure EXAM: PORTABLE CHEST 1 VIEW COMPARISON:  Chest x-ray dated May 21, 2021 FINDINGS: Tracheostomy tube, enteric tube, right IJ line, and left IJ line are unchanged in position. Stable cardiac and mediastinal contours. Bibasilar atelectasis. No focal consolidation. No large pleural effusion or pneumothorax. IMPRESSION: Stable support devices.  No new parenchymal opacity. Electronically Signed   By: Yetta Glassman M.D.   On: 05/23/2021 08:28   DG Chest Port 1 View  Result Date: 05/21/2021 CLINICAL DATA:  Fever EXAM: PORTABLE CHEST 1 VIEW COMPARISON:  05/20/2021 FINDINGS: Tracheostomy in good position. NG tube in the stomach. Right jugular central venous catheter tip in the right atrium unchanged. Left jugular central venous catheter tip at the mid SVC unchanged. Progression of bilateral airspace disease left greater than right.  Progression of small pleural effusions. Negative for pneumothorax. IMPRESSION: Support lines unchanged in position Progressive bilateral airspace disease. This likely represents fluid overload however could represent pneumonia. Electronically Signed   By: Franchot Gallo M.D.   On: 05/21/2021 17:39   DG Chest Port 1 View  Result Date:  05/20/2021 CLINICAL DATA:  Vomiting EXAM: PORTABLE CHEST 1 VIEW COMPARISON:  05/15/2021 chest radiograph. FINDINGS: Tracheostomy tube tip overlies the tracheal air column at the thoracic inlet. Right internal jugular central venous catheter terminates over the cavoatrial junction. Enteric tube enters stomach with the tip not seen on this image. Left internal jugular central venous catheter terminates in the upper third of the SVC. Stable cardiomediastinal silhouette with mild cardiomegaly. No pneumothorax. Trace bilateral pleural effusions, similar. Borderline mild pulmonary edema, improved. Improved lung volumes with residual mild left basilar atelectasis. IMPRESSION: 1. Mild position support structures.  No pneumothorax. 2. Borderline mild congestive heart failure, improved. 3. Trace bilateral pleural effusions, similar. 4. Improved lung volumes with residual mild left basilar atelectasis. Electronically Signed   By: Ilona Sorrel M.D.   On: 05/20/2021 08:12   DG Chest Port 1 View  Result Date: 05/11/2021 CLINICAL DATA:  Central line placement. EXAM: PORTABLE CHEST 1 VIEW COMPARISON:  05/10/2021 and earlier exams. FINDINGS: New left internal jugular central venous line. Catheter tip projects in the mid superior vena cava at its confluence with the left brachiocephalic vein. Right internal jugular central venous line, endotracheal tube and nasal/orogastric tube are stable in well positioned. Mild increase in lung base opacities compared to the previous day's exam, consistent with atelectasis accentuated by low lung volumes and Alexandra Foster rotation. Remainder of the lungs is clear. No  convincing pneumothorax. IMPRESSION: 1. New left internal jugular central venous catheter, tip projecting in the mid superior vena cava. 2. No pneumothorax. 3. Mild increase in lung base opacities consistent with atelectasis. No other change. Electronically Signed   By: Lajean Manes M.D.   On: 05/11/2021 12:56   DG Chest Port 1 View  Result Date: 05/10/2021 CLINICAL DATA:  Post intubation, OG tube EXAM: PORTABLE CHEST 1 VIEW COMPARISON:  05/10/2021 at 1906 hours FINDINGS: Lungs are essentially clear.  No pleural effusion or pneumothorax. Endotracheal tube terminates 3 cm above the carina. Enteric tube courses into the stomach. Right IJ venous catheter terminates at the cavoatrial junction. Defibrillator pads overlying the chest. IMPRESSION: Endotracheal tube terminates 3 cm above the carina. Enteric tube courses into the stomach. Electronically Signed   By: Julian Hy M.D.   On: 05/10/2021 21:29   DG Chest Port 1 View  Result Date: 05/10/2021 CLINICAL DATA:  Short of breath EXAM: PORTABLE CHEST 1 VIEW COMPARISON:  05/07/2021 FINDINGS: Single frontal view of the chest demonstrates stable right internal jugular catheter. Endotracheal tube is been removed. Cardiac silhouette is unremarkable. No airspace disease, effusion, or pneumothorax. Minimal linear opacities at the lung bases consistent with scarring or subsegmental atelectasis. No acute bony abnormalities. IMPRESSION: 1. Bibasilar hypoventilatory change.  No acute process. Electronically Signed   By: Randa Ngo M.D.   On: 05/10/2021 19:13   DG Chest Port 1 View  Result Date: 05/07/2021 CLINICAL DATA:  Central line placement. EXAM: PORTABLE CHEST 1 VIEW COMPARISON:  05/04/2021 FINDINGS: A new right jugular central venous catheter seen with tip overlying the mid right atrium. No evidence of pneumothorax. Moderate cardiomegaly is again noted. Alexandra Foster is partially rotated to the right. Low lung volumes are again seen, however there is no  evidence of pulmonary consolidation or pleural effusion. IMPRESSION: New right jugular central venous catheter tip overlies the mid right atrium. No evidence of pneumothorax. Moderate cardiomegaly and low lung volumes. Electronically Signed   By: Marlaine Hind M.D.   On: 05/07/2021 20:18   DG Chest Port 1 View  Result Date: 05/04/2021  CLINICAL DATA:  Questionable sepsis. EXAM: PORTABLE CHEST 1 VIEW COMPARISON:  05/17/2019 FINDINGS: Cardiomegaly. No confluent airspace opacity, effusions or edema. No acute bony abnormality. IMPRESSION: No active disease. Electronically Signed   By: Rolm Baptise M.D.   On: 05/04/2021 21:03   EEG adult  Result Date: 05/13/2021 Derek Jack, MD     05/13/2021  8:16 PM Routine EEG Report Jenah Vanasten Sarff is a 71 y.o. female with a history of septic shock and encephalopathy who is undergoing an EEG to evaluate for seizures. Report: This EEG was acquired with electrodes placed according to the International 10-20 electrode system (including Fp1, Fp2, F3, F4, C3, C4, P3, P4, O1, O2, T3, T4, T5, T6, A1, A2, Fz, Cz, Pz). The following electrodes were missing or displaced: none. The best background was continuous at 3-4 Hz. This activity is reactive to stimulation. No sleep architecture was identified. There were frequent triphasic waves that did not appear epileptiform. There was no focal slowing. There were no interictal epileptiform discharges. There were no electrographic seizures identified. Photic stimulation and hyperventilation were not performed. Impression and clinical correlation: This EEG was obtained while sedated on hydromorphone and is abnormal due to: - Severe diffuse slowing indicative of global cerebral dysfunction - Frequent triphasic waves indicative of metabolic encephalopathy Epileptiform abnormalities were not seen during this recording. Su Monks, MD [facility] [phone] If 7pm- 7am, please page neurology on call as listed in [place].    ECHOCARDIOGRAM COMPLETE  Result Date: 05/16/2021    ECHOCARDIOGRAM REPORT   Alexandra Foster Name:   [facility] Date of Exam: 05/16/2021 Medical Rec #:  [id number]           Height:       65.0 in Accession #:    [id number]          Weight:       273.4 lb Date of Birth:  [birth year]           BSA:          2.259 m Alexandra Foster Age:    55 years            BP:           115/50 mmHg Alexandra Foster Gender: F                   HR:           75 bpm. Exam Location:  [facility] Procedure: 2D Echo, Color Doppler and Cardiac Doppler Indications:     R94.31 Abnormal ECG  History:         Alexandra Foster has prior history of Echocardiogram examinations, most                  recent 05/06/2021. COPD; Risk Factors:Diabetes and Hypertension.  Sonographer:     Charmayne Sheer Referring Phys:  [id number] Flora Lipps Diagnosing Phys: Ida Rogue MD  Sonographer Comments: Echo performed with Alexandra Foster supine and on artificial respirator and Technically difficult study due to poor echo windows. Image acquisition challenging due to Alexandra Foster body habitus and Image acquisition challenging due to COPD. IMPRESSIONS  1. Left ventricular ejection fraction, by estimation, is 60 to 65%. The left ventricle has normal function. The left ventricle has no regional wall motion abnormalities. There is mild left ventricular hypertrophy. Left ventricular diastolic parameters are consistent with Grade I diastolic dysfunction (impaired relaxation).  2. Right ventricular systolic function is normal. The right ventricular size is normal. There is mildly elevated pulmonary artery systolic  pressure. The estimated right ventricular systolic pressure is 34.1 mmHg.  3. The mitral valve is normal in structure. No evidence of mitral valve regurgitation. No evidence of mitral stenosis.  4. The aortic valve is normal in structure. Aortic valve regurgitation is not visualized. No aortic stenosis is present.  5. The inferior vena cava is normal in size with greater than 50% respiratory variability,  suggesting right atrial pressure of 3 mmHg. FINDINGS  Left Ventricle: Left ventricular ejection fraction, by estimation, is 60 to 65%. The left ventricle has normal function. The left ventricle has no regional wall motion abnormalities. The left ventricular internal cavity size was normal in size. There is  mild left ventricular hypertrophy. Left ventricular diastolic parameters are consistent with Grade I diastolic dysfunction (impaired relaxation). Right Ventricle: The right ventricular size is normal. No increase in right ventricular wall thickness. Right ventricular systolic function is normal. There is mildly elevated pulmonary artery systolic pressure. The tricuspid regurgitant velocity is 3.16  m/s, and with an assumed right atrial pressure of 5 mmHg, the estimated right ventricular systolic pressure is 96.2 mmHg. Left Atrium: Left atrial size was normal in size. Right Atrium: Right atrial size was normal in size. Pericardium: There is no evidence of pericardial effusion. Mitral Valve: The mitral valve is normal in structure. No evidence of mitral valve regurgitation. No evidence of mitral valve stenosis. MV peak gradient, 3.7 mmHg. The mean mitral valve gradient is 2.0 mmHg. Tricuspid Valve: The tricuspid valve is normal in structure. Tricuspid valve regurgitation is mild . No evidence of tricuspid stenosis. Aortic Valve: The aortic valve is normal in structure. Aortic valve regurgitation is not visualized. No aortic stenosis is present. Aortic valve mean gradient measures 6.0 mmHg. Aortic valve peak gradient measures 9.9 mmHg. Aortic valve area, by VTI measures 1.95 cm. Pulmonic Valve: The pulmonic valve was normal in structure. Pulmonic valve regurgitation is mild. No evidence of pulmonic stenosis. Aorta: The aortic root is normal in size and structure. Venous: The inferior vena cava is normal in size with greater than 50% respiratory variability, suggesting right atrial pressure of 3 mmHg. IAS/Shunts: No  atrial level shunt detected by color flow Doppler.  LEFT VENTRICLE PLAX 2D LVIDd:         4.57 cm   Diastology LVIDs:         3.74 cm   LV e' medial:    5.66 cm/s LV PW:         1.01 cm   LV E/e' medial:  10.6 LV IVS:        0.73 cm   LV e' lateral:   4.13 cm/s LVOT diam:     1.80 cm   LV E/e' lateral: 14.6 LV SV:         47 LV SV Index:   21 LVOT Area:     2.54 cm  LEFT ATRIUM           Index LA diam:      3.20 cm 1.42 cm/m LA Vol (A4C): 42.8 ml 18.95 ml/m  AORTIC VALVE                     PULMONIC VALVE AV Area (Vmax):    2.07 cm      PV Vmax:          1.32 m/s AV Area (Vmean):   1.83 cm      PV Vmean:         97.400 cm/s AV Area (  VTI):     1.95 cm      PV VTI:           0.208 m AV Vmax:           157.00 cm/s   PV Peak grad:     7.0 mmHg AV Vmean:          108.000 cm/s  PV Mean grad:     4.0 mmHg AV VTI:            0.240 m       PR End Diast Vel: 6.66 msec AV Peak Grad:      9.9 mmHg AV Mean Grad:      6.0 mmHg LVOT Vmax:         128.00 cm/s LVOT Vmean:        77.800 cm/s LVOT VTI:          0.184 m LVOT/AV VTI ratio: 0.77  AORTA Ao Root diam: 2.60 cm MITRAL VALVE               TRICUSPID VALVE MV Area (PHT): 3.27 cm    TR Peak grad:   39.9 mmHg MV Area VTI:   1.81 cm    TR Vmax:        316.00 cm/s MV Peak grad:  3.7 mmHg MV Mean grad:  2.0 mmHg    SHUNTS MV Vmax:       0.96 m/s    Systemic VTI:  0.18 m MV Vmean:      56.7 cm/s   Systemic Diam: 1.80 cm MV Decel Time: 232 msec MV E velocity: 60.10 cm/s MV A velocity: 67.90 cm/s MV E/A ratio:  0.89 Ida Rogue MD Electronically signed by Ida Rogue MD Signature Date/Time: 05/16/2021/3:38:58 PM    Final    ECHOCARDIOGRAM COMPLETE BUBBLE STUDY  Result Date: 05/06/2021    ECHOCARDIOGRAM REPORT   Alexandra Foster Name:   [facility] Date of Exam: 05/06/2021 Medical Rec #:  [id number]           Height:       65.0 in Accession #:    [id number]          Weight:       272.0 lb Date of Birth:  Nov 28, 1950           BSA:          2.254 m Alexandra Foster Age:    25 years             BP:           93/69 mmHg Alexandra Foster Gender: F                   HR:           139 bpm. Exam Location:  [facility] Procedure: 2D Echo, Cardiac Doppler, Color Doppler and Saline Contrast Bubble            Study Indications:     Bacteremia 790.7  History:         Alexandra Foster has no prior history of Echocardiogram examinations.                  COPD; Risk Factors:Hypertension and Diabetes.  Sonographer:     Sherrie Sport Referring Phys:  [id number] ADAM ROSS SCHERTZ Diagnosing Phys: Kathlyn Sacramento MD  Sonographer Comments: Suboptimal parasternal window and suboptimal apical window. Image acquisition challenging due to COPD. IMPRESSIONS  1. Left ventricular ejection fraction, by estimation, is 50 to  55%. The left ventricle has low normal function. Left ventricular endocardial border not optimally defined to evaluate regional wall motion. There is mild left ventricular hypertrophy. Left ventricular diastolic parameters are indeterminate.  2. Right ventricular systolic function is normal. The right ventricular size is normal. Tricuspid regurgitation signal is inadequate for assessing PA pressure.  3. Left atrial size was mildly dilated.  4. Right atrial size was mildly dilated.  5. The mitral valve is normal in structure. No evidence of mitral valve regurgitation. No evidence of mitral stenosis.  6. The aortic valve is normal in structure. Aortic valve regurgitation is not visualized. Aortic valve sclerosis/calcification is present, without any evidence of aortic stenosis.  7. challenging image quality. FINDINGS  Left Ventricle: Left ventricular ejection fraction, by estimation, is 50 to 55%. The left ventricle has low normal function. Left ventricular endocardial border not optimally defined to evaluate regional wall motion. The left ventricular internal cavity  size was normal in size. There is mild left ventricular hypertrophy. Left ventricular diastolic parameters are indeterminate. Right Ventricle: The right ventricular size is  normal. No increase in right ventricular wall thickness. Right ventricular systolic function is normal. Tricuspid regurgitation signal is inadequate for assessing PA pressure. Left Atrium: Left atrial size was mildly dilated. Right Atrium: Right atrial size was mildly dilated. Pericardium: There is no evidence of pericardial effusion. Mitral Valve: The mitral valve is normal in structure. No evidence of mitral valve regurgitation. No evidence of mitral valve stenosis. Tricuspid Valve: The tricuspid valve is normal in structure. Tricuspid valve regurgitation is not demonstrated. No evidence of tricuspid stenosis. Aortic Valve: The aortic valve is normal in structure. Aortic valve regurgitation is not visualized. Aortic valve sclerosis/calcification is present, without any evidence of aortic stenosis. Aortic valve mean gradient measures 5.0 mmHg. Aortic valve peak  gradient measures 8.9 mmHg. Aortic valve area, by VTI measures 2.75 cm. Pulmonic Valve: The pulmonic valve was normal in structure. Pulmonic valve regurgitation is not visualized. No evidence of pulmonic stenosis. Aorta: The aortic root is normal in size and structure. Venous: The inferior vena cava was not well visualized. IAS/Shunts: No atrial level shunt detected by color flow Doppler. Agitated saline contrast was given intravenously to evaluate for intracardiac shunting.  LEFT VENTRICLE PLAX 2D LVIDd:         2.80 cm LVIDs:         2.10 cm LV PW:         1.10 cm LV IVS:        1.05 cm LVOT diam:     2.00 cm LV SV:         45 LV SV Index:   20 LVOT Area:     3.14 cm  RIGHT VENTRICLE RV Basal diam:  4.00 cm RV S prime:     18.70 cm/s LEFT ATRIUM             Index        RIGHT ATRIUM           Index LA diam:        3.00 cm 1.33 cm/m   RA Area:     20.20 cm LA Vol (A2C):   56.4 ml 25.02 ml/m  RA Volume:   61.90 ml  27.46 ml/m LA Vol (A4C):   51.0 ml 22.62 ml/m LA Biplane Vol: 53.5 ml 23.73 ml/m  AORTIC VALVE AV Area (Vmax):    2.40 cm AV Area  (Vmean):   2.31 cm AV Area (VTI):  2.75 cm AV Vmax:           149.00 cm/s AV Vmean:          107.000 cm/s AV VTI:            0.164 m AV Peak Grad:      8.9 mmHg AV Mean Grad:      5.0 mmHg LVOT Vmax:         114.00 cm/s LVOT Vmean:        78.700 cm/s LVOT VTI:          0.144 m LVOT/AV VTI ratio: 0.88  AORTA Ao Root diam: 2.83 cm MITRAL VALVE                TRICUSPID VALVE MV Area (PHT): 6.07 cm     TR Peak grad:   13.4 mmHg MV Decel Time: 125 msec     TR Vmax:        183.00 cm/s MV E velocity: 111.00 cm/s                             SHUNTS                             Systemic VTI:  0.14 m                             Systemic Diam: 2.00 cm Kathlyn Sacramento MD Electronically signed by Kathlyn Sacramento MD Signature Date/Time: 05/06/2021/1:25:12 PM    Final      Assessment/Plan  Multisystem organ dysfunction: As noted in the H&P Alexandra Foster is at high 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  She is debilitated and immobile.  She has anemia and cannot be prophylaxed or anticoagulated.  She is unable to wear or utilize mechanical prophylaxis because of her left leg fasciitis.  Given these findings IVC filter is indicated.  I discussed this with the daughter I have also discussed removal of the filter when she is improved.  Risks and benefits were reviewed all questions were answered Alexandra Foster's daughter agrees for [place] to proceed. 2.  Necrotizing fasciitis: Plan per general surgery 3.  Acute renal failure: Continue CRRT permacath placement when appropriate 4.  Respiratory failure: Alexandra Foster is on maximal ventilatory support with tracheostomy.  She will wean as tolerated   Hortencia Pilar, MD  05/29/2021 8:20 AM

## 2021-05-29 NOTE — Progress Notes (Signed)
CRRT stopped for IVC placement with DR Schnier.

## 2021-05-29 NOTE — H&P (View-Only) (Signed)
'@LOGO' @   MRN : [id number]  Alexandra Foster is a 71 y.o. ([birth year]) female who presents with chief complaint of multisystem organ failure secondary to sepsis in association with anemia.  History of Present Illness:  I am asked to evaluate the patient by Dr. Mortimer Fries for possible IVC filter placement.  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  Given her septic presentation she has undergone multisystem organ failure, organ systems include her respiratory system and she now has a tracheostomy, her GI tract and she now has a PEG tube as well as her renal function and she is now on CRRT.  She essentially is at total bedrest.  Given her anemia she is not a candidate for anticoagulation and given her leg wound mechanical prophylaxis is not a possibility.  Current Meds  Medication Sig   albuterol (PROVENTIL) (2.5 MG/3ML) 0.083% nebulizer solution Take 3 mLs (2.5 mg total) by nebulization every 6 (six) hours as needed for wheezing or shortness of breath.   aspirin 81 MG tablet Take 1 tablet (81 mg total) by mouth daily.   atorvastatin (LIPITOR) 40 MG tablet Take 1 tablet (40 mg total) by mouth daily.   azelastine (ASTELIN) 0.1 % nasal spray Place 2 sprays into both nostrils 2 (two) times daily as needed for rhinitis or allergies.   baclofen (LIORESAL) 10 MG tablet Take 10 mg by mouth daily.   celecoxib (CELEBREX) 200 MG capsule Take 200 mg by mouth daily.   Cetirizine HCl (ZYRTEC ALLERGY) 10 MG TBDP Take 10 mg by mouth at bedtime. For sinus symptoms and seasonal allergies   docusate sodium (COLACE) 100 MG capsule Take 100 mg by mouth daily.   fluticasone (FLONASE) 50 MCG/ACT nasal spray    glucose blood (ACCU-CHEK AVIVA PLUS) test strip Use as instructed   Lancets (ACCU-CHEK SOFT TOUCH) lancets Use as instructed   meloxicam (MOBIC) 15 MG tablet Take 15 mg by mouth daily.   metFORMIN (GLUCOPHAGE-XR)  750 MG 24 hr tablet Take 1 tablet (750 mg total) by mouth daily with breakfast.   montelukast (SINGULAIR) 10 MG tablet Take 1 tablet (10 mg total) by mouth at bedtime.   Multiple Vitamins-Minerals (MULTIVITAL PO) Take 1 tablet by mouth daily.   omeprazole (PRILOSEC) 20 MG capsule Take 1 capsule (20 mg total) by mouth daily.   ORTHOVISC 30 MG/2ML SOSY    triamterene-hydrochlorothiazide (DYAZIDE) 37.5-25 MG capsule TAKE 1 CAPSULE EVERY DAY   vitamin B-12 (CYANOCOBALAMIN) 100 MCG tablet Take 100 mcg by mouth daily.    Past Medical History:  Diagnosis Date   AKI (acute kidney injury) ([place])    a. 04/2021 in setting of bacteremia/shock.   Arthritis    Asthma    Bacteremia    a. 04/2021 S pyogenes bacteremia in setting of lower ext cellulitis.   COPD (chronic obstructive pulmonary disease) (HCC)    Diabetes mellitus without complication (HCC)    Endometriosis    GERD (gastroesophageal reflux disease)    History of echocardiogram    a. 07/2013 Echo: EF 55-60%, impaired relaxation, mild TR; b. 04/2021 Echo: EF 50-55%, mild LVH, nl RV fxn, mild BAE, Ao sclerosis w/o stenosis.   Hypertension    Obesity    PAF (paroxysmal atrial fibrillation) ([place])    a. 04/2021 in setting of septic shock/cellulitis.   Sleep apnea    CPAP    Past Surgical History:  Procedure Laterality Date  ABDOMINAL HYSTERECTOMY     APPLICATION OF WOUND VAC  05/10/2021   Procedure: APPLICATION OF WOUND VAC;  Surgeon: Herbert Pun, MD;  Location: [facility];  Service: General;;   APPLICATION OF WOUND VAC Left 05/17/2021   Procedure: APPLICATION OF WOUND VAC/WOUND VAC EXCHANGE-Matrix Myriad;  Surgeon: Herbert Pun, MD;  Location: [facility];  Service: General;  Laterality: Left;   APPLICATION OF WOUND VAC  05/24/2021   Procedure: APPLICATION OF WOUND VAC;  Surgeon: Herbert Pun, MD;  Location: [facility];  Service: General;;   COLONOSCOPY  10/29/2006   Dr Allen Norris   COLONOSCOPY WITH PROPOFOL N/A 11/05/2016    Procedure: COLONOSCOPY WITH PROPOFOL;  Surgeon: Robert Bellow, MD;  Location: [facility];  Service: Endoscopy;  Laterality: N/A;   INCISION AND DRAINAGE OF WOUND Left 05/10/2021   Procedure: IRRIGATION AND DEBRIDEMENT LEFT LEG;  Surgeon: Herbert Pun, MD;  Location: [facility];  Service: General;  Laterality: Left;   INCISION AND DRAINAGE OF WOUND Left 05/24/2021   Procedure: IRRIGATION AND DEBRIDEMENT LEFT LEG;  Surgeon: Herbert Pun, MD;  Location: [facility];  Service: General;  Laterality: Left;   MINOR GRAFT APPLICATION  7/34/1937   Procedure: Myriad Matrix  APPLICATION;  Surgeon: Herbert Pun, MD;  Location: [facility];  Service: General;;   NASAL SINUS SURGERY  2002   Dr Carlis Abbott   TRACHEOSTOMY TUBE PLACEMENT N/A 05/17/2021   Procedure: TRACHEOSTOMY;  Surgeon: Clyde Canterbury, MD;  Location: [facility];  Service: ENT;  Laterality: N/A;   WOUND DEBRIDEMENT Left 05/07/2021   Procedure: DEBRIDEMENT WOUND;  Surgeon: Herbert Pun, MD;  Location: [facility];  Service: General;  Laterality: Left;    Social History Social History   Tobacco Use   Smoking status: Former    Packs/day: 2.00    Years: 40.00    Pack years: 80.00    Types: Cigarettes    Quit date: 2003    Years since quitting: 20.1   Smokeless tobacco: Former    Types: Snuff    Quit date: 04/2001   Tobacco comments:    smoking cessation materials not required  Vaping Use   Vaping Use: Never used  Substance Use Topics   Alcohol use: No    Alcohol/week: 0.0 standard drinks   Drug use: No    Family History Family History  Problem Relation Age of Onset   Congestive Heart Failure Mother    Coronary artery disease Father 45   Breast cancer Sister 81   Heart disease Brother    Varicose Veins Brother    Alcohol abuse Brother     No Known Allergies   REVIEW OF SYSTEMS (Negative unless checked)  Constitutional: '[]' Weight loss  '[]' Fever  '[]' Chills Cardiac: '[]' Chest pain   '[]' Chest pressure    '[]' Palpitations   '[]' Shortness of breath when laying flat   '[]' Shortness of breath with exertion. Vascular:  '[]' Pain in legs with walking   '[]' Pain in legs at rest  '[]' History of DVT   '[]' Phlebitis   '[x]' Swelling in legs   '[]' Varicose veins   '[]' Non-healing ulcers Pulmonary:   '[]' Uses home oxygen   '[]' Productive cough   '[]' Hemoptysis   '[]' Wheeze  '[]' COPD   '[]' Asthma Neurologic:  '[]' Dizziness   '[]' Seizures   '[]' History of stroke   '[]' History of TIA  '[]' Aphasia   '[]' Vissual changes   '[]' Weakness or numbness in arm   '[]' Weakness or numbness in leg Musculoskeletal:   '[]' Joint swelling   '[]' Joint pain   '[]' Low back pain Hematologic:  '[]' Easy bruising  '[]'   Easy bleeding   '[]' Hypercoagulable state   '[]' Anemic Gastrointestinal:  '[]' Diarrhea   '[]' Vomiting  '[]' Gastroesophageal reflux/heartburn   '[]' Difficulty swallowing. Genitourinary:  '[]' Chronic kidney disease   '[]' Difficult urination  '[]' Frequent urination   '[]' Blood in urine Skin:  '[]' Rashes   '[]' Ulcers  Psychological:  '[]' History of anxiety   '[]'  History of major depression.  Physical Examination  Vitals:   05/29/21 0700 05/29/21 0730 05/29/21 0800 05/29/21 0804  BP: (!) 106/55 (!) 103/53 (!) 107/58   Pulse: 77 80 82 79  Resp: (!) 23 (!) 27 (!) 25 (!) 24  Temp: 98.1 F (36.7 C)     TempSrc:      SpO2: 92% 91% 92% 94%  Weight:      Height:       Body mass index is 42.96 kg/m. Gen: WD/WN, in the intensive care unit receiving maximal medical therapy head: [place]/AT, No temporalis wasting.  Ear/Nose/Throat: Hearing grossly intact, nares w/o erythema or drainage, pinna without lesions Eyes: PER,  sclera nonicteric.  Neck: Tracheostomy present, no gross masses.  No JVD.  Pulmonary: On vent, no use of accessory muscles.  Cardiac: RRR, precordium not hyperdynamic. Vascular:  scattered varicosities present bilaterally.  Mild venous stasis changes to the legs bilaterally.  2+ soft pitting edema  Vessel Right Left  Radial Palpable Palpable  Gastrointestinal: PEG tube present, non-distended. No  guarding/no peritoneal signs.  Musculoskeletal: M/S 5/5 throughout.  Left leg deformity.  Dermatologic: Venous rashes left leg dressed.  No changes consistent with cellulitis. Lymph : No lichenification or skin changes of chronic lymphedema.  CBC Lab Results  Component Value Date   WBC 9.8 05/29/2021   HGB 8.4 (L) 05/29/2021   HCT 27.3 (L) 05/29/2021   MCV 94.1 05/29/2021   PLT 184 05/29/2021    BMET    Component Value Date/Time   NA 137 05/29/2021 0422   NA 138 08/11/2013 0933   K 4.2 05/29/2021 0422   K 3.3 (L) 08/11/2013 0933   CL 105 05/29/2021 0422   CL 102 08/11/2013 0933   CO2 26 05/29/2021 0422   CO2 32 08/11/2013 0933   GLUCOSE 143 (H) 05/29/2021 0422   GLUCOSE 107 (H) 08/11/2013 0933   BUN 37 (H) 05/29/2021 0422   BUN 13 08/11/2013 0933   CREATININE 1.33 (H) 05/29/2021 0422   CREATININE 0.98 02/11/2021 0941   CALCIUM 9.1 05/29/2021 0422   CALCIUM 9.2 08/11/2013 0933   GFRNONAA 43 (L) 05/29/2021 0422   GFRNONAA 61 07/14/2019 0940   GFRAA 71 07/14/2019 0940   Estimated Creatinine Clearance: 48.9 mL/min (A) (by C-G formula based on SCr of 1.33 mg/dL (H)).  COAG Lab Results  Component Value Date   INR 1.2 05/28/2021   INR 1.1 05/24/2021   INR 1.2 05/22/2021    Radiology DG Chest 1 View  Result Date: 05/15/2021 CLINICAL DATA:  71 year old female with respiratory failure. Sepsis. EXAM: CHEST  1 VIEW COMPARISON:  Portable chest 05/11/2021 and earlier. FINDINGS: Portable AP supine view at 0609 hours. The patient remains mildly rotated to the right. Endotracheal tube tip in good position between the clavicles and carina. Stable left IJ approach dual lumen vascular catheter. Enteric tube and pH probe in place and appears stable. Right IJ central line tip now projects at the level the right atrium although lung volumes are slightly lower. Stable cardiac size and mediastinal contours. No pneumothorax. No pleural effusion or consolidation. Increased pulmonary  vascularity since 05/10/2021. Paucity of bowel gas. No acute osseous abnormality identified.  IMPRESSION: 1. Essentially stable lines and tubes. 2. Lower lung volumes with increased pulmonary vascularity. Consider mild or developing interstitial edema. No pleural effusion or consolidation. Electronically Signed   By: Genevie Ann M.D.   On: 05/15/2021 06:46   DG Chest 1 View  Result Date: 05/07/2021 CLINICAL DATA:  Endotracheally intubated. EXAM: CHEST  1 VIEW COMPARISON:  Chest radiograph earlier today. FINDINGS: Endotracheal tube tip is 4 cm from the carina at the level of the clavicular heads. Right internal jugular central line tip in the region of the atrial caval junction. Cardiomegaly again seen. No pneumothorax. Lung volumes remain low without confluent consolidation. No significant pleural effusion. IMPRESSION: 1. Endotracheal tube tip 4 cm from the carina at the level of the clavicular heads. 2. Right central line tip at the atrial caval junction. 3. Stable cardiomegaly. Low lung volumes. Electronically Signed   By: Keith Rake M.D.   On: 05/07/2021 23:56   DG Abd 1 View  Result Date: 05/20/2021 CLINICAL DATA:  Vomiting EXAM: ABDOMEN - 1 VIEW COMPARISON:  Abdominal radiograph dated May 17, 2021 FINDINGS: NG tube tip and side port within the stomach. Paucity of bowel gas somewhat limits evaluation. A few nondilated gas-filled loops of bowel are seen in the pelvis. IMPRESSION: Paucity of bowel gas somewhat limits evaluation. A few nondilated gas-filled loops of bowel are seen in the pelvis. Electronically Signed   By: Yetta Glassman M.D.   On: 05/20/2021 08:12   DG Abd 1 View  Result Date: 05/17/2021 CLINICAL DATA:  Nasogastric tube placement EXAM: ABDOMEN - 1 VIEW COMPARISON:  05/15/2021 FINDINGS: Nasogastric tube tip is seen within the epigastric region overlying the expected mid body of the stomach. Small retained contrast likely within the gastric fundus. Central venous catheter tip noted  within the superior right atrium. Retrocardiac atelectasis or infiltrate noted. The abdominal gas pattern is indeterminate due to a paucity of intra-abdominal gas. IMPRESSION: Nasogastric tube tip within the expected mid body of the stomach. Electronically Signed   By: Fidela Salisbury M.D.   On: 05/17/2021 19:37   DG Abd 1 View  Result Date: 05/15/2021 CLINICAL DATA:  71 year old female with respiratory failure. Sepsis. EXAM: ABDOMEN - 1 VIEW COMPARISON:  05/11/2021. FINDINGS: Portable AP supine views at 0603 hours. Enteric tube terminates in the gastric body. Side hole appears to be inside the stomach. There is mild to moderate gastric distention with air. A small volume of fundal oral contrast persists. Paucity of bowel gas elsewhere in the visible abdomen and pelvis. Left hemipelvis phlebolith. No acute osseous abnormality identified. IMPRESSION: 1. Satisfactory enteric tube termination in the stomach. Mild to moderate gastric distention with air. Small volume oral contrast suspected in the gastric fundus. 2. Paucity of bowel gas elsewhere in the abdomen and pelvis. Electronically Signed   By: Genevie Ann M.D.   On: 05/15/2021 06:47   DG Abd 1 View  Result Date: 05/11/2021 CLINICAL DATA:  NG tube placement. EXAM: ABDOMEN - 1 VIEW COMPARISON:  None. FINDINGS: Nasal/orogastric tube passes below the diaphragm, well into the stomach, tip below the included field of view. Endotracheal tube tip projects 3 cm above the carina. IMPRESSION: 1. Well-positioned nasal/orogastric tube. Electronically Signed   By: Lajean Manes M.D.   On: 05/11/2021 11:33   CT HEAD WO CONTRAST (5MM)  Addendum Date: 05/14/2021   ADDENDUM REPORT: 05/14/2021 16:47 ADDENDUM: These results will be called to the ordering clinician or representative by the Radiologist Assistant, and communication documented in the PACS  or [employer]. Electronically Signed   By: Franchot Gallo M.D.   On: 05/14/2021 16:47   Result Date:  05/14/2021 CLINICAL DATA:  Subarachnoid hemorrhage.  Sepsis. EXAM: CT HEAD WITHOUT CONTRAST TECHNIQUE: Contiguous axial images were obtained from the base of the skull through the vertex without intravenous contrast. RADIATION DOSE REDUCTION: This exam was performed according to the departmental dose-optimization program which includes automated exposure control, adjustment of the mA and/or kV according to patient size and/or use of iterative reconstruction technique. COMPARISON:  MRI head 05/14/2021 FINDINGS: Brain: Hyperdensity in the left middle frontal sulcus. This corresponds to the FLAIR hyperintensity on MRI and is most consistent with small volume acute subarachnoid hemorrhage on the left. No other areas of high-density hemorrhage. Ventricle size normal.  Negative for acute infarct or mass. Empty sella with enlargement of the sella filled with CSF. Small pituitary. Vascular: Negative for hyperdense vessel Skull: Negative Sinuses/Orbits: Extensive mucosal edema paranasal sinuses. Probable air-fluid levels in the maxillary sinus bilaterally. Bilateral proptosis without orbital mass. Other: None IMPRESSION: Hyperintensity left middle frontal sulcus compatible with acute subarachnoid hemorrhage, small volume. No other acute intracranial abnormality. No hydrocephalus or infarct. Extensive paranasal sinus mucosal disease with air-fluid levels. Electronically Signed: By: Franchot Gallo M.D. On: 05/14/2021 15:59   MR ANGIO HEAD WO CONTRAST  Result Date: 05/15/2021 CLINICAL DATA:  Subarachnoid hemorrhage EXAM: MRA HEAD WITHOUT CONTRAST TECHNIQUE: Angiographic images of the Circle of Willis were acquired using MRA technique without intravenous contrast. COMPARISON:  No prior MRA, correlation is made with MRI 05/14/2021 FINDINGS: Anterior circulation: Both internal carotid arteries are patent to the termini, without significant stenosis. A1 segments patent. Normal anterior communicating artery. Anterior cerebral  arteries are patent to their distal aspects. No M1 stenosis or occlusion. Normal MCA bifurcations. Distal MCA branches perfused and symmetric. Posterior circulation: Vertebral arteries patent to the vertebrobasilar junction without stenosis. Basilar patent to its distal aspect. Superior cerebellar arteries patent bilaterally. Patent P1 segments. PCAs perfused to their distal aspects without stenosis. The bilateral posterior communicating arteries are patent. Anatomic variants: None significant Other: None. IMPRESSION: No intracranial large vessel occlusion or significant stenosis. No aneurysm or other vascular abnormality. Electronically Signed   By: Merilyn Baba M.D.   On: 05/15/2021 02:43   MR BRAIN WO CONTRAST  Result Date: 05/14/2021 CLINICAL DATA:  Altered mental status, septic shock secondary to left lower extremity cellulitis EXAM: MRI HEAD WITHOUT CONTRAST TECHNIQUE: Multiplanar, multiecho pulse sequences of the brain and surrounding structures were obtained without intravenous contrast. COMPARISON:  Brain MRI 08/10/2013 FINDINGS: Brain: There is no evidence of acute infarct. There is sulcal FLAIR hyperintensity with trace SWI signal dropout overlying a left frontal lobe sulcus and the right superior frontal sulcus (15-35, 15-39). There is no other evidence of acute intracranial hemorrhage or extra-axial fluid collection. Parenchymal volume is normal. The ventricles are normal in size. Parenchymal signal is normal, with no significant burden of white matter microangiopathic change. A partially empty sella is noted. There is no mass lesion. There is no midline shift. Vascular: Normal flow voids. Skull and upper cervical spine: Normal marrow signal. Sinuses/Orbits: There is mucosal thickening throughout the paranasal sinuses with layering fluid in the maxillary sinuses and complete opacification of the mastoid air cells, likely at least partially related to intubation. The globes and orbits are  unremarkable. Other: There is scalp fluid predominantly in the bilateral occipital regions. 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 IMPRESSION: 1. Sulcal FLAIR hyperintensity overlying bilateral frontal lobe  sulci may be artifactual related to intubation; however, subarachnoid hemorrhage can have a similar appearance. Differential also includes meningitis. Recommend noncontrast CT for further evaluation for subarachnoid hemorrhage. Consider correlation with lumbar puncture to evaluate for meningitis as indicated, if the CT head is negative for hemorrhage. 2. Fluid in the bilateral occipital scalp. These results were called by telephone at the time of interpretation on 05/14/2021 at 3:10 pm to provider DANA GRAVES NP, who verbally acknowledged these results. Electronically Signed   By: Valetta Mole M.D.   On: 05/14/2021 15:11   CT Tibia Fibula Left Wo Contrast  Result Date: 05/04/2021 CLINICAL DATA:  Concern for soft tissue infection. EXAM: CT OF THE LOWER LEFT EXTREMITY WITHOUT CONTRAST TECHNIQUE: Multidetector CT imaging of the lower left extremity was performed according to the standard protocol. RADIATION DOSE REDUCTION: This exam was performed according to the departmental dose-optimization program which includes automated exposure control, adjustment of the mA and/or kV according to patient size and/or use of iterative reconstruction technique. COMPARISON:  None. FINDINGS: Bones/Joint/Cartilage No acute fracture or dislocation. There is moderate arthritic changes of the knee with tricompartmental narrowing. Ligaments Suboptimally assessed by CT. Muscles and Tendons No acute findings. Soft tissues Diffuse subcutaneous edema may represent cellulitis. No drainable fluid collection/abscess. No soft tissue gas. IMPRESSION: Diffuse subcutaneous edema may represent cellulitis. No drainable fluid collection/abscess. Electronically Signed   By: Anner Crete M.D.   On: 05/04/2021 22:22   US Venous Img  Lower Unilateral Left  Result Date: 05/04/2021 CLINICAL DATA:  Left lower extremity pain. EXAM: Left LOWER EXTREMITY VENOUS DOPPLER ULTRASOUND TECHNIQUE: Gray-scale sonography with compression, as well as color and duplex ultrasound, were performed to evaluate the deep venous system(s) from the level of the common femoral vein through the popliteal and proximal calf veins. COMPARISON:  None. FINDINGS: VENOUS Normal compressibility of the common femoral, superficial femoral, and popliteal veins, as well as the visualized calf veins. Visualized portions of profunda femoral vein and great saphenous vein unremarkable. No filling defects to suggest DVT on grayscale or color Doppler imaging. Doppler waveforms show normal direction of venous flow, normal respiratory plasticity and response to augmentation. Limited views of the contralateral common femoral vein are unremarkable. OTHER None. Limitations: none IMPRESSION: Negative. Electronically Signed   By: Anner Crete M.D.   On: 05/04/2021 23:39   DG Chest Port 1 View  Result Date: 05/23/2021 CLINICAL DATA:  Acute respiratory failure EXAM: PORTABLE CHEST 1 VIEW COMPARISON:  Chest x-ray dated May 21, 2021 FINDINGS: Tracheostomy tube, enteric tube, right IJ line, and left IJ line are unchanged in position. Stable cardiac and mediastinal contours. Bibasilar atelectasis. No focal consolidation. No large pleural effusion or pneumothorax. IMPRESSION: Stable support devices.  No new parenchymal opacity. Electronically Signed   By: Yetta Glassman M.D.   On: 05/23/2021 08:28   DG Chest Port 1 View  Result Date: 05/21/2021 CLINICAL DATA:  Fever EXAM: PORTABLE CHEST 1 VIEW COMPARISON:  05/20/2021 FINDINGS: Tracheostomy in good position. NG tube in the stomach. Right jugular central venous catheter tip in the right atrium unchanged. Left jugular central venous catheter tip at the mid SVC unchanged. Progression of bilateral airspace disease left greater than right.  Progression of small pleural effusions. Negative for pneumothorax. IMPRESSION: Support lines unchanged in position Progressive bilateral airspace disease. This likely represents fluid overload however could represent pneumonia. Electronically Signed   By: Franchot Gallo M.D.   On: 05/21/2021 17:39   DG Chest Port 1 View  Result Date:  05/20/2021 CLINICAL DATA:  Vomiting EXAM: PORTABLE CHEST 1 VIEW COMPARISON:  05/15/2021 chest radiograph. FINDINGS: Tracheostomy tube tip overlies the tracheal air column at the thoracic inlet. Right internal jugular central venous catheter terminates over the cavoatrial junction. Enteric tube enters stomach with the tip not seen on this image. Left internal jugular central venous catheter terminates in the upper third of the SVC. Stable cardiomediastinal silhouette with mild cardiomegaly. No pneumothorax. Trace bilateral pleural effusions, similar. Borderline mild pulmonary edema, improved. Improved lung volumes with residual mild left basilar atelectasis. IMPRESSION: 1. Mild position support structures.  No pneumothorax. 2. Borderline mild congestive heart failure, improved. 3. Trace bilateral pleural effusions, similar. 4. Improved lung volumes with residual mild left basilar atelectasis. Electronically Signed   By: Ilona Sorrel M.D.   On: 05/20/2021 08:12   DG Chest Port 1 View  Result Date: 05/11/2021 CLINICAL DATA:  Central line placement. EXAM: PORTABLE CHEST 1 VIEW COMPARISON:  05/10/2021 and earlier exams. FINDINGS: New left internal jugular central venous line. Catheter tip projects in the mid superior vena cava at its confluence with the left brachiocephalic vein. Right internal jugular central venous line, endotracheal tube and nasal/orogastric tube are stable in well positioned. Mild increase in lung base opacities compared to the previous day's exam, consistent with atelectasis accentuated by low lung volumes and patient rotation. Remainder of the lungs is clear. No  convincing pneumothorax. IMPRESSION: 1. New left internal jugular central venous catheter, tip projecting in the mid superior vena cava. 2. No pneumothorax. 3. Mild increase in lung base opacities consistent with atelectasis. No other change. Electronically Signed   By: Lajean Manes M.D.   On: 05/11/2021 12:56   DG Chest Port 1 View  Result Date: 05/10/2021 CLINICAL DATA:  Post intubation, OG tube EXAM: PORTABLE CHEST 1 VIEW COMPARISON:  05/10/2021 at 1906 hours FINDINGS: Lungs are essentially clear.  No pleural effusion or pneumothorax. Endotracheal tube terminates 3 cm above the carina. Enteric tube courses into the stomach. Right IJ venous catheter terminates at the cavoatrial junction. Defibrillator pads overlying the chest. IMPRESSION: Endotracheal tube terminates 3 cm above the carina. Enteric tube courses into the stomach. Electronically Signed   By: Julian Hy M.D.   On: 05/10/2021 21:29   DG Chest Port 1 View  Result Date: 05/10/2021 CLINICAL DATA:  Short of breath EXAM: PORTABLE CHEST 1 VIEW COMPARISON:  05/07/2021 FINDINGS: Single frontal view of the chest demonstrates stable right internal jugular catheter. Endotracheal tube is been removed. Cardiac silhouette is unremarkable. No airspace disease, effusion, or pneumothorax. Minimal linear opacities at the lung bases consistent with scarring or subsegmental atelectasis. No acute bony abnormalities. IMPRESSION: 1. Bibasilar hypoventilatory change.  No acute process. Electronically Signed   By: Randa Ngo M.D.   On: 05/10/2021 19:13   DG Chest Port 1 View  Result Date: 05/07/2021 CLINICAL DATA:  Central line placement. EXAM: PORTABLE CHEST 1 VIEW COMPARISON:  05/04/2021 FINDINGS: A new right jugular central venous catheter seen with tip overlying the mid right atrium. No evidence of pneumothorax. Moderate cardiomegaly is again noted. Patient is partially rotated to the right. Low lung volumes are again seen, however there is no  evidence of pulmonary consolidation or pleural effusion. IMPRESSION: New right jugular central venous catheter tip overlies the mid right atrium. No evidence of pneumothorax. Moderate cardiomegaly and low lung volumes. Electronically Signed   By: Marlaine Hind M.D.   On: 05/07/2021 20:18   DG Chest Port 1 View  Result Date: 05/04/2021  CLINICAL DATA:  Questionable sepsis. EXAM: PORTABLE CHEST 1 VIEW COMPARISON:  05/17/2019 FINDINGS: Cardiomegaly. No confluent airspace opacity, effusions or edema. No acute bony abnormality. IMPRESSION: No active disease. Electronically Signed   By: Rolm Baptise M.D.   On: 05/04/2021 21:03   EEG adult  Result Date: 05/13/2021 Derek Jack, MD     05/13/2021  8:16 PM Routine EEG Report Hamna Asa Hush is a 71 y.o. female with a history of septic shock and encephalopathy who is undergoing an EEG to evaluate for seizures. Report: This EEG was acquired with electrodes placed according to the International 10-20 electrode system (including Fp1, Fp2, F3, F4, C3, C4, P3, P4, O1, O2, T3, T4, T5, T6, A1, A2, Fz, Cz, Pz). The following electrodes were missing or displaced: none. The best background was continuous at 3-4 Hz. This activity is reactive to stimulation. No sleep architecture was identified. There were frequent triphasic waves that did not appear epileptiform. There was no focal slowing. There were no interictal epileptiform discharges. There were no electrographic seizures identified. Photic stimulation and hyperventilation were not performed. Impression and clinical correlation: This EEG was obtained while sedated on hydromorphone and is abnormal due to: - Severe diffuse slowing indicative of global cerebral dysfunction - Frequent triphasic waves indicative of metabolic encephalopathy Epileptiform abnormalities were not seen during this recording. Su Monks, MD [facility] [phone] If 7pm- 7am, please page neurology on call as listed in [place].    ECHOCARDIOGRAM COMPLETE  Result Date: 05/16/2021    ECHOCARDIOGRAM REPORT   Patient Name:   [facility] Date of Exam: 05/16/2021 Medical Rec #:  [id number]           Height:       65.0 in Accession #:    [id number]          Weight:       273.4 lb Date of Birth:  06-Jul-1950           BSA:          2.259 m Patient Age:    50 years            BP:           115/50 mmHg Patient Gender: F                   HR:           75 bpm. Exam Location:  [facility] Procedure: 2D Echo, Color Doppler and Cardiac Doppler Indications:     R94.31 Abnormal ECG  History:         Patient has prior history of Echocardiogram examinations, most                  recent 05/06/2021. COPD; Risk Factors:Diabetes and Hypertension.  Sonographer:     Charmayne Sheer Referring Phys:  [id number] Flora Lipps Diagnosing Phys: Ida Rogue MD  Sonographer Comments: Echo performed with patient supine and on artificial respirator and Technically difficult study due to poor echo windows. Image acquisition challenging due to patient body habitus and Image acquisition challenging due to COPD. IMPRESSIONS  1. Left ventricular ejection fraction, by estimation, is 60 to 65%. The left ventricle has normal function. The left ventricle has no regional wall motion abnormalities. There is mild left ventricular hypertrophy. Left ventricular diastolic parameters are consistent with Grade I diastolic dysfunction (impaired relaxation).  2. Right ventricular systolic function is normal. The right ventricular size is normal. There is mildly elevated pulmonary artery systolic  pressure. The estimated right ventricular systolic pressure is 14.7 mmHg.  3. The mitral valve is normal in structure. No evidence of mitral valve regurgitation. No evidence of mitral stenosis.  4. The aortic valve is normal in structure. Aortic valve regurgitation is not visualized. No aortic stenosis is present.  5. The inferior vena cava is normal in size with greater than 50% respiratory variability,  suggesting right atrial pressure of 3 mmHg. FINDINGS  Left Ventricle: Left ventricular ejection fraction, by estimation, is 60 to 65%. The left ventricle has normal function. The left ventricle has no regional wall motion abnormalities. The left ventricular internal cavity size was normal in size. There is  mild left ventricular hypertrophy. Left ventricular diastolic parameters are consistent with Grade I diastolic dysfunction (impaired relaxation). Right Ventricle: The right ventricular size is normal. No increase in right ventricular wall thickness. Right ventricular systolic function is normal. There is mildly elevated pulmonary artery systolic pressure. The tricuspid regurgitant velocity is 3.16  m/s, and with an assumed right atrial pressure of 5 mmHg, the estimated right ventricular systolic pressure is 82.9 mmHg. Left Atrium: Left atrial size was normal in size. Right Atrium: Right atrial size was normal in size. Pericardium: There is no evidence of pericardial effusion. Mitral Valve: The mitral valve is normal in structure. No evidence of mitral valve regurgitation. No evidence of mitral valve stenosis. MV peak gradient, 3.7 mmHg. The mean mitral valve gradient is 2.0 mmHg. Tricuspid Valve: The tricuspid valve is normal in structure. Tricuspid valve regurgitation is mild . No evidence of tricuspid stenosis. Aortic Valve: The aortic valve is normal in structure. Aortic valve regurgitation is not visualized. No aortic stenosis is present. Aortic valve mean gradient measures 6.0 mmHg. Aortic valve peak gradient measures 9.9 mmHg. Aortic valve area, by VTI measures 1.95 cm. Pulmonic Valve: The pulmonic valve was normal in structure. Pulmonic valve regurgitation is mild. No evidence of pulmonic stenosis. Aorta: The aortic root is normal in size and structure. Venous: The inferior vena cava is normal in size with greater than 50% respiratory variability, suggesting right atrial pressure of 3 mmHg. IAS/Shunts: No  atrial level shunt detected by color flow Doppler.  LEFT VENTRICLE PLAX 2D LVIDd:         4.57 cm   Diastology LVIDs:         3.74 cm   LV e' medial:    5.66 cm/s LV PW:         1.01 cm   LV E/e' medial:  10.6 LV IVS:        0.73 cm   LV e' lateral:   4.13 cm/s LVOT diam:     1.80 cm   LV E/e' lateral: 14.6 LV SV:         47 LV SV Index:   21 LVOT Area:     2.54 cm  LEFT ATRIUM           Index LA diam:      3.20 cm 1.42 cm/m LA Vol (A4C): 42.8 ml 18.95 ml/m  AORTIC VALVE                     PULMONIC VALVE AV Area (Vmax):    2.07 cm      PV Vmax:          1.32 m/s AV Area (Vmean):   1.83 cm      PV Vmean:         97.400 cm/s AV Area (  VTI):     1.95 cm      PV VTI:           0.208 m AV Vmax:           157.00 cm/s   PV Peak grad:     7.0 mmHg AV Vmean:          108.000 cm/s  PV Mean grad:     4.0 mmHg AV VTI:            0.240 m       PR End Diast Vel: 6.66 msec AV Peak Grad:      9.9 mmHg AV Mean Grad:      6.0 mmHg LVOT Vmax:         128.00 cm/s LVOT Vmean:        77.800 cm/s LVOT VTI:          0.184 m LVOT/AV VTI ratio: 0.77  AORTA Ao Root diam: 2.60 cm MITRAL VALVE               TRICUSPID VALVE MV Area (PHT): 3.27 cm    TR Peak grad:   39.9 mmHg MV Area VTI:   1.81 cm    TR Vmax:        316.00 cm/s MV Peak grad:  3.7 mmHg MV Mean grad:  2.0 mmHg    SHUNTS MV Vmax:       0.96 m/s    Systemic VTI:  0.18 m MV Vmean:      56.7 cm/s   Systemic Diam: 1.80 cm MV Decel Time: 232 msec MV E velocity: 60.10 cm/s MV A velocity: 67.90 cm/s MV E/A ratio:  0.89 Ida Rogue MD Electronically signed by Ida Rogue MD Signature Date/Time: 05/16/2021/3:38:58 PM    Final    ECHOCARDIOGRAM COMPLETE BUBBLE STUDY  Result Date: 05/06/2021    ECHOCARDIOGRAM REPORT   Patient Name:   [facility] Date of Exam: 05/06/2021 Medical Rec #:  [id number]           Height:       65.0 in Accession #:    [id number]          Weight:       272.0 lb Date of Birth:  02-21-1951           BSA:          2.254 m Patient Age:    31 years             BP:           93/69 mmHg Patient Gender: F                   HR:           139 bpm. Exam Location:  [facility] Procedure: 2D Echo, Cardiac Doppler, Color Doppler and Saline Contrast Bubble            Study Indications:     Bacteremia 790.7  History:         Patient has no prior history of Echocardiogram examinations.                  COPD; Risk Factors:Hypertension and Diabetes.  Sonographer:     Sherrie Sport Referring Phys:  [id number] ADAM ROSS SCHERTZ Diagnosing Phys: Kathlyn Sacramento MD  Sonographer Comments: Suboptimal parasternal window and suboptimal apical window. Image acquisition challenging due to COPD. IMPRESSIONS  1. Left ventricular ejection fraction, by estimation, is 50 to  55%. The left ventricle has low normal function. Left ventricular endocardial border not optimally defined to evaluate regional wall motion. There is mild left ventricular hypertrophy. Left ventricular diastolic parameters are indeterminate.  2. Right ventricular systolic function is normal. The right ventricular size is normal. Tricuspid regurgitation signal is inadequate for assessing PA pressure.  3. Left atrial size was mildly dilated.  4. Right atrial size was mildly dilated.  5. The mitral valve is normal in structure. No evidence of mitral valve regurgitation. No evidence of mitral stenosis.  6. The aortic valve is normal in structure. Aortic valve regurgitation is not visualized. Aortic valve sclerosis/calcification is present, without any evidence of aortic stenosis.  7. challenging image quality. FINDINGS  Left Ventricle: Left ventricular ejection fraction, by estimation, is 50 to 55%. The left ventricle has low normal function. Left ventricular endocardial border not optimally defined to evaluate regional wall motion. The left ventricular internal cavity  size was normal in size. There is mild left ventricular hypertrophy. Left ventricular diastolic parameters are indeterminate. Right Ventricle: The right ventricular size is  normal. No increase in right ventricular wall thickness. Right ventricular systolic function is normal. Tricuspid regurgitation signal is inadequate for assessing PA pressure. Left Atrium: Left atrial size was mildly dilated. Right Atrium: Right atrial size was mildly dilated. Pericardium: There is no evidence of pericardial effusion. Mitral Valve: The mitral valve is normal in structure. No evidence of mitral valve regurgitation. No evidence of mitral valve stenosis. Tricuspid Valve: The tricuspid valve is normal in structure. Tricuspid valve regurgitation is not demonstrated. No evidence of tricuspid stenosis. Aortic Valve: The aortic valve is normal in structure. Aortic valve regurgitation is not visualized. Aortic valve sclerosis/calcification is present, without any evidence of aortic stenosis. Aortic valve mean gradient measures 5.0 mmHg. Aortic valve peak  gradient measures 8.9 mmHg. Aortic valve area, by VTI measures 2.75 cm. Pulmonic Valve: The pulmonic valve was normal in structure. Pulmonic valve regurgitation is not visualized. No evidence of pulmonic stenosis. Aorta: The aortic root is normal in size and structure. Venous: The inferior vena cava was not well visualized. IAS/Shunts: No atrial level shunt detected by color flow Doppler. Agitated saline contrast was given intravenously to evaluate for intracardiac shunting.  LEFT VENTRICLE PLAX 2D LVIDd:         2.80 cm LVIDs:         2.10 cm LV PW:         1.10 cm LV IVS:        1.05 cm LVOT diam:     2.00 cm LV SV:         45 LV SV Index:   20 LVOT Area:     3.14 cm  RIGHT VENTRICLE RV Basal diam:  4.00 cm RV S prime:     18.70 cm/s LEFT ATRIUM             Index        RIGHT ATRIUM           Index LA diam:        3.00 cm 1.33 cm/m   RA Area:     20.20 cm LA Vol (A2C):   56.4 ml 25.02 ml/m  RA Volume:   61.90 ml  27.46 ml/m LA Vol (A4C):   51.0 ml 22.62 ml/m LA Biplane Vol: 53.5 ml 23.73 ml/m  AORTIC VALVE AV Area (Vmax):    2.40 cm AV Area  (Vmean):   2.31 cm AV Area (VTI):  2.75 cm AV Vmax:           149.00 cm/s AV Vmean:          107.000 cm/s AV VTI:            0.164 m AV Peak Grad:      8.9 mmHg AV Mean Grad:      5.0 mmHg LVOT Vmax:         114.00 cm/s LVOT Vmean:        78.700 cm/s LVOT VTI:          0.144 m LVOT/AV VTI ratio: 0.88  AORTA Ao Root diam: 2.83 cm MITRAL VALVE                TRICUSPID VALVE MV Area (PHT): 6.07 cm     TR Peak grad:   13.4 mmHg MV Decel Time: 125 msec     TR Vmax:        183.00 cm/s MV E velocity: 111.00 cm/s                             SHUNTS                             Systemic VTI:  0.14 m                             Systemic Diam: 2.00 cm Kathlyn Sacramento MD Electronically signed by Kathlyn Sacramento MD Signature Date/Time: 05/06/2021/1:25:12 PM    Final      Assessment/Plan  Multisystem organ dysfunction: As noted in the H&P patient is at high 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  She is debilitated and immobile.  She has anemia and cannot be prophylaxed or anticoagulated.  She is unable to wear or utilize mechanical prophylaxis because of her left leg fasciitis.  Given these findings IVC filter is indicated.  I discussed this with the daughter I have also discussed removal of the filter when she is improved.  Risks and benefits were reviewed all questions were answered patient's daughter agrees for [place] to proceed. 2.  Necrotizing fasciitis: Plan per general surgery 3.  Acute renal failure: Continue CRRT permacath placement when appropriate 4.  Respiratory failure: Patient is on maximal ventilatory support with tracheostomy.  She will wean as tolerated   Hortencia Pilar, MD  05/29/2021 8:20 AM

## 2021-05-29 NOTE — Progress Notes (Signed)
PT Cancellation Note  Patient Details Name: Alexandra Foster MRN: [id number] DOB: 1951-02-10   Cancelled Treatment:     Reason Eval/Treat Not Completed: Patient at procedure or test/unavailable   Lieutenant Diego PT, DPT 10:40 AM,05/29/21

## 2021-05-29 NOTE — Progress Notes (Signed)
Patient ID: Alexandra Foster, female   DOB: 1951-02-25, 71 y.o.   MRN: [id number] Alexandra Foster, Alexandra Foster [id number] November 11, 1950 [name], MD   SUBJECTIVE: This 71 y.o. year old female is status post tracheostomy.  No leaks from the new extra long distal tracheostomy tube reported by the nursing staff.  They are packing the wound and report no issues. Medications:  Current Facility-Administered Medications  Medication Dose Route Frequency Provider Last Rate Last Admin    prismasol BGK 4/2.5 infusion   CRRT Continuous Schnier, Dolores Lory, MD 400 mL/hr at 05/29/21 0418 New Bag at 05/29/21 0418    prismasol BGK 4/2.5 infusion   CRRT Continuous Schnier, Dolores Lory, MD 400 mL/hr at 05/29/21 0416 New Bag at 05/29/21 0416   acetaminophen (TYLENOL) tablet 650 mg  650 mg Per NG tube Q6H PRN Schnier, Dolores Lory, MD   650 mg at 05/22/21 1551   amiodarone (PACERONE) tablet 200 mg  200 mg Per Tube Daily Schnier, Dolores Lory, MD   200 mg at 05/27/21 1031   artificial tears (LACRILUBE) ophthalmic ointment   Both Eyes Q4H PRN Schnier, Dolores Lory, MD   Given at 05/16/21 2313   ascorbic acid (VITAMIN C) tablet 500 mg  500 mg Per Tube BID Flora Lipps, MD       Derrill Memo ON 05/30/2021] ceFAZolin (ANCEF) IVPB 1 g/50 mL premix  1 g Intravenous On Call Schnier, Dolores Lory, MD       chlorhexidine gluconate (MEDLINE KIT) (PERIDEX) 0.12 % solution 15 mL  15 mL Mouth Rinse BID Schnier, Dolores Lory, MD   15 mL at 05/27/21 8099   Chlorhexidine Gluconate Cloth 2 % PADS 6 each  6 each Topical Q0600 Katha Cabal, MD   6 each at 05/29/21 0846   Darbepoetin Alfa (ARANESP) injection 100 mcg  100 mcg Subcutaneous Q7 days Katha Cabal, MD   100 mcg at 05/27/21 1412   docusate (COLACE) 50 MG/5ML liquid 100 mg  100 mg Per Tube BID PRN Schnier, Dolores Lory, MD       escitalopram (LEXAPRO) tablet 10 mg  10 mg Per Tube Daily Teressa Lower, NP   10 mg at 05/29/21 1616   feeding supplement (PIVOT 1.5 CAL) liquid 1,000 mL  1,000 mL Per Tube  Continuous Flora Lipps, MD       fentaNYL (SUBLIMAZE) 50 MCG/ML injection            fentaNYL (SUBLIMAZE) injection 25-50 mcg  25-50 mcg Intravenous Q2H PRN Schnier, Dolores Lory, MD   25 mcg at 05/29/21 8338   free water 30 mL  30 mL Per Tube Q4H Flora Lipps, MD       gentamicin (GARAMYCIN) 80 mg in sodium chloride 0.9 % 500 mL irrigation  80 mg Irrigation QID Schnier, Dolores Lory, MD   80 mg at 05/29/21 1125   heparin injection 1,000-6,000 Units  1,000-6,000 Units CRRT PRN Katha Cabal, MD   1,300 Units at 05/29/21 1001   hydrocortisone sodium succinate (SOLU-CORTEF) 100 MG injection 100 mg  100 mg Intravenous Q12H Katha Cabal, MD   100 mg at 05/29/21 0905   insulin aspart (novoLOG) injection 0-20 Units  0-20 Units Subcutaneous Q4H Schnier, Dolores Lory, MD   3 Units at 05/29/21 1550   insulin aspart (novoLOG) injection 4 Units  4 Units Subcutaneous Q4H Schnier, Dolores Lory, MD   4 Units at 05/27/21 0759   insulin detemir (LEVEMIR) injection 17 Units  17 Units Subcutaneous BID  Schnier, Dolores Lory, MD   17 Units at 05/27/21 2106   lip balm (BLISTEX) ointment   Topical PRN Schnier, Dolores Lory, MD   Given at 05/23/21 1940   magic mouthwash w/lidocaine  10 mL Oral QID Delana Meyer Dolores Lory, MD   10 mL at 05/29/21 0847   MEDLINE mouth rinse  15 mL Mouth Rinse 10 times per day Katha Cabal, MD   15 mL at 05/29/21 0606   midazolam (VERSED) 2 MG/2ML injection            midazolam (VERSED) injection 1-2 mg  1-2 mg Intravenous Q2H PRN Schnier, Dolores Lory, MD   2 mg at 05/29/21 0109   midodrine (PROAMATINE) tablet 10 mg  10 mg Per Tube TID WC Schnier, Dolores Lory, MD   10 mg at 05/29/21 1549   multivitamin (RENA-VIT) tablet 1 tablet  1 tablet Per Tube QHS Schnier, Dolores Lory, MD   1 tablet at 05/27/21 2106   norepinephrine (LEVOPHED) 16 mg in 210m premix infusion  0-40 mcg/min Intravenous Titrated SKatha Cabal MD 5.63 mL/hr at 05/29/21 1032 6 mcg/min at 05/29/21 1032   nutrition supplement (JUVEN)  (JUVEN) powder packet 1 packet  1 packet Per Tube BID BM Schnier, GDolores Lory MD   1 packet at 05/29/21 1420   ondansetron (ZOFRAN) injection 4 mg  4 mg Intravenous Q6H PRN Schnier, GDolores Lory MD   4 mg at 05/20/21 00762  oxyCODONE (Oxy IR/ROXICODONE) immediate release tablet 5 mg  5 mg Per Tube Q6H NTeressa Lower NP   5 mg at 05/29/21 1549   oxyCODONE (Oxy IR/ROXICODONE) immediate release tablet 5-10 mg  5-10 mg Per Tube Q6H PRN Schnier, GDolores Lory MD   10 mg at 05/28/21 0753   pantoprazole (PROTONIX) injection 40 mg  40 mg Intravenous Q24H Schnier, GDolores Lory MD   40 mg at 05/29/21 0429   piperacillin-tazobactam (ZOSYN) IVPB 3.375 g  3.375 g Intravenous Q8H Ravishankar, JJoellyn Quails MD 12.5 mL/hr at 05/29/21 1018 3.375 g at 05/29/21 1018   prismasol BGK 4/2.5 infusion   CRRT Continuous Schnier, GDolores Lory MD 1,000 mL/hr at 05/29/21 1451 New Bag at 05/29/21 1451   sennosides (SENOKOT) 8.8 MG/5ML syrup 10 mL  10 mL Per Tube QHS PRN Schnier, GDolores Lory MD   10 mL at 05/14/21 2105   sodium chloride 0.9 % primer fluid for CRRT   CRRT PRN Schnier, GDolores Lory MD       sodium chloride flush (NS) 0.9 % injection 10-40 mL  10-40 mL Intracatheter Q12H Schnier, GDolores Lory MD   10 mL at 05/29/21 0906   sodium chloride flush (NS) 0.9 % injection 10-40 mL  10-40 mL Intracatheter PRN Schnier, GDolores Lory MD       vitamin A capsule 10,000 Units  10,000 Units Per Tube Daily Schnier, GDolores Lory MD       zinc sulfate capsule 220 mg  220 mg Per Tube Daily Schnier, GDolores Lory MD   220 mg at 05/29/21 1549  .  Medications Prior to Admission  Medication Sig Dispense Refill   albuterol (PROVENTIL) (2.5 MG/3ML) 0.083% nebulizer solution Take 3 mLs (2.5 mg total) by nebulization every 6 (six) hours as needed for wheezing or shortness of breath. 150 mL 1   aspirin 81 MG tablet Take 1 tablet (81 mg total) by mouth daily. 30 tablet 0   atorvastatin (LIPITOR) 40 MG tablet Take 1 tablet (40 mg total) by mouth daily. 90 tablet  1    azelastine (ASTELIN) 0.1 % nasal spray Place 2 sprays into both nostrils 2 (two) times daily as needed for rhinitis or allergies. 30 mL 1   baclofen (LIORESAL) 10 MG tablet Take 10 mg by mouth daily.     celecoxib (CELEBREX) 200 MG capsule Take 200 mg by mouth daily.     Cetirizine HCl (ZYRTEC ALLERGY) 10 MG TBDP Take 10 mg by mouth at bedtime. For sinus symptoms and seasonal allergies 30 tablet 1   docusate sodium (COLACE) 100 MG capsule Take 100 mg by mouth daily.     fluticasone (FLONASE) 50 MCG/ACT nasal spray      glucose blood (ACCU-CHEK AVIVA PLUS) test strip Use as instructed 100 each 12   Lancets (ACCU-CHEK SOFT TOUCH) lancets Use as instructed 100 each 12   meloxicam (MOBIC) 15 MG tablet Take 15 mg by mouth daily.     metFORMIN (GLUCOPHAGE-XR) 750 MG 24 hr tablet Take 1 tablet (750 mg total) by mouth daily with breakfast. 90 tablet 1   montelukast (SINGULAIR) 10 MG tablet Take 1 tablet (10 mg total) by mouth at bedtime. 90 tablet 1   Multiple Vitamins-Minerals (MULTIVITAL PO) Take 1 tablet by mouth daily.     omeprazole (PRILOSEC) 20 MG capsule Take 1 capsule (20 mg total) by mouth daily. 90 capsule 1   ORTHOVISC 30 MG/2ML SOSY      triamterene-hydrochlorothiazide (DYAZIDE) 37.5-25 MG capsule TAKE 1 CAPSULE EVERY DAY 90 capsule 1   vitamin B-12 (CYANOCOBALAMIN) 100 MCG tablet Take 100 mcg by mouth daily.     calcium carbonate (OSCAL) 1500 (600 Ca) MG TABS tablet Take 600 mg of elemental calcium by mouth 2 (two) times daily with a meal. (Patient not taking: Reported on 05/05/2021)     Fluticasone-Umeclidin-Vilant (TRELEGY ELLIPTA) 100-62.5-25 MCG/INH AEPB Inhale 1 puff into the lungs daily. In place of Stiolto 180 each 1   loratadine (CLARITIN) 10 MG tablet Take 1 tablet (10 mg total) by mouth daily. 90 tablet 1    OBJECTIVE:  PHYSICAL EXAM  Vitals: Blood pressure 130/76, pulse (!) 58, temperature 98.7 F (37.1 C), resp. rate 20, height 5' 5" (1.651 m), weight 117.1 kg, SpO2 100  %.. Neck: Tracheostomy wound is still slow to granulate inferiorly.  She appears to keep some secretions in the wound likely from the upper throat as there appears to be no further cuff leak from the tracheostomy itself.  Wound may be slightly more granulated and than earlier this week however. MEDICAL DECISION MAKING: Data Review:  Results for orders placed or performed during the hospital encounter of 05/04/21 (from the past 48 hour(s))  Glucose, capillary     Status: Abnormal   Collection Time: 05/27/21  7:29 PM  Result Value Ref Range   Glucose-Capillary 157 (H) 70 - 99 mg/dL    Comment: Glucose reference range applies only to samples taken after fasting for at least 8 hours.   Comment 1 Notify RN    Comment 2 Document in Chart   Glucose, capillary     Status: Abnormal   Collection Time: 05/27/21 11:32 PM  Result Value Ref Range   Glucose-Capillary 148 (H) 70 - 99 mg/dL    Comment: Glucose reference range applies only to samples taken after fasting for at least 8 hours.   Comment 1 Notify RN    Comment 2 Document in Chart   Glucose, capillary     Status: Abnormal   Collection Time: 05/28/21  3:25 AM  Result Value Ref Range   Glucose-Capillary 130 (H) 70 - 99 mg/dL    Comment: Glucose reference range applies only to samples taken after fasting for at least 8 hours.   Comment 1 Notify RN    Comment 2 Document in Chart   CBC with Differential/Platelet     Status: Abnormal   Collection Time: 05/28/21  4:39 AM  Result Value Ref Range   WBC 13.7 (H) 4.0 - 10.5 K/uL   RBC 2.27 (L) 3.87 - 5.11 MIL/uL   Hemoglobin 6.8 (L) 12.0 - 15.0 g/dL   HCT 22.1 (L) 36.0 - 46.0 %   MCV 97.4 80.0 - 100.0 fL   MCH 30.0 26.0 - 34.0 pg   MCHC 30.8 30.0 - 36.0 g/dL   RDW 21.2 (H) 11.5 - 15.5 %   Platelets 183 150 - 400 K/uL   nRBC 6.2 (H) 0.0 - 0.2 %   Neutrophils Relative % 89 %   Neutro Abs 12.2 (H) 1.7 - 7.7 K/uL   Lymphocytes Relative 3 %   Lymphs Abs 0.5 (L) 0.7 - 4.0 K/uL   Monocytes  Relative 7 %   Monocytes Absolute 1.0 0.1 - 1.0 K/uL   Eosinophils Relative 0 %   Eosinophils Absolute 0.0 0.0 - 0.5 K/uL   Basophils Relative 0 %   Basophils Absolute 0.0 0.0 - 0.1 K/uL   Immature Granulocytes 1 %   Abs Immature Granulocytes 0.13 (H) 0.00 - 0.07 K/uL   Polychromasia PRESENT     Comment: Performed at [facility], [address] [place], [place] [address]  Renal function panel (daily at 0500)     Status: Abnormal   Collection Time: 05/28/21  4:39 AM  Result Value Ref Range   Sodium 136 135 - 145 mmol/L   Potassium 3.8 3.5 - 5.1 mmol/L   Chloride 104 98 - 111 mmol/L   CO2 25 22 - 32 mmol/L   Glucose, Bld 135 (H) 70 - 99 mg/dL    Comment: Glucose reference range applies only to samples taken after fasting for at least 8 hours.   BUN 54 (H) 8 - 23 mg/dL   Creatinine, Ser 1.34 (H) 0.44 - 1.00 mg/dL   Calcium 9.0 8.9 - 10.3 mg/dL   Phosphorus 3.3 2.5 - 4.6 mg/dL   Albumin 2.9 (L) 3.5 - 5.0 g/dL   GFR, Estimated 42 (L) >60 mL/min    Comment: (NOTE) Calculated using the CKD-EPI Creatinine Equation (2021)    Anion gap 7 5 - 15    Comment: Performed at [facility], [address] [place], [place] [address]  Magnesium     Status: None   Collection Time: 05/28/21  4:39 AM  Result Value Ref Range   Magnesium 2.2 1.7 - 2.4 mg/dL    Comment: Performed at [facility], [address] [place], [place] [address]  Type and screen [place]     Status: None   Collection Time: 05/28/21  5:07 AM  Result Value Ref Range   ABO/RH(D) O POS    Antibody Screen NEG    Sample Expiration      05/31/2021,2359 Performed at [facility], [address], [place], [place] [address]    Unit Number [id number]    Blood Component Type RED CELLS,LR    Unit division 00    Status of Unit ISSUED,FINAL    Transfusion Status OK TO TRANSFUSE    Crossmatch Result Compatible   Prepare RBC (  crossmatch)     Status: None    Collection Time: 05/28/21  5:07 AM  Result Value Ref Range   Order Confirmation      ORDER PROCESSED BY BLOOD BANK Performed at [facility], [address] [place], [place] [address]   Glucose, capillary     Status: Abnormal   Collection Time: 05/28/21  7:27 AM  Result Value Ref Range   Glucose-Capillary 105 (H) 70 - 99 mg/dL    Comment: Glucose reference range applies only to samples taken after fasting for at least 8 hours.  Hemoglobin and hematocrit, blood     Status: Abnormal   Collection Time: 05/28/21 11:27 AM  Result Value Ref Range   Hemoglobin 7.6 (L) 12.0 - 15.0 g/dL   HCT 24.8 (L) 36.0 - 46.0 %    Comment: Performed at [facility], [address] [place], [place] [address]  Glucose, capillary     Status: Abnormal   Collection Time: 05/28/21  1:26 PM  Result Value Ref Range   Glucose-Capillary 122 (H) 70 - 99 mg/dL    Comment: Glucose reference range applies only to samples taken after fasting for at least 8 hours.  Glucose, capillary     Status: Abnormal   Collection Time: 05/28/21  3:55 PM  Result Value Ref Range   Glucose-Capillary 125 (H) 70 - 99 mg/dL    Comment: Glucose reference range applies only to samples taken after fasting for at least 8 hours.  Renal function panel (daily at 1600)     Status: Abnormal   Collection Time: 05/28/21  4:01 PM  Result Value Ref Range   Sodium 136 135 - 145 mmol/L   Potassium 3.8 3.5 - 5.1 mmol/L   Chloride 102 98 - 111 mmol/L   CO2 25 22 - 32 mmol/L   Glucose, Bld 131 (H) 70 - 99 mg/dL    Comment: Glucose reference range applies only to samples taken after fasting for at least 8 hours.   BUN 43 (H) 8 - 23 mg/dL   Creatinine, Ser 1.09 (H) 0.44 - 1.00 mg/dL   Calcium 9.2 8.9 - 10.3 mg/dL   Phosphorus 3.8 2.5 - 4.6 mg/dL   Albumin 3.1 (L) 3.5 - 5.0 g/dL   GFR, Estimated 54 (L) >60 mL/min    Comment: (NOTE) Calculated using the CKD-EPI Creatinine Equation (2021)    Anion gap 9 5 - 15    Comment:  Performed at [facility], [address] [place], [place] [address]  Protime-INR     Status: None   Collection Time: 05/28/21  4:01 PM  Result Value Ref Range   Prothrombin Time 14.9 11.4 - 15.2 seconds   INR 1.2 0.8 - 1.2    Comment: (NOTE) INR goal varies based on device and disease states. Performed at [facility], [address] [place], [place] [address]   Glucose, capillary     Status: Abnormal   Collection Time: 05/28/21  7:19 PM  Result Value Ref Range   Glucose-Capillary 103 (H) 70 - 99 mg/dL    Comment: Glucose reference range applies only to samples taken after fasting for at least 8 hours.   Comment 1 Notify RN    Comment 2 Document in Chart   Glucose, capillary     Status: Abnormal   Collection Time: 05/28/21 11:54 PM  Result Value Ref Range   Glucose-Capillary 112 (H) 70 - 99 mg/dL    Comment: Glucose reference range applies only  to samples taken after fasting for at least 8 hours.   Comment 1 Notify RN    Comment 2 Document in Chart   Glucose, capillary     Status: Abnormal   Collection Time: 05/29/21  4:16 AM  Result Value Ref Range   Glucose-Capillary 137 (H) 70 - 99 mg/dL    Comment: Glucose reference range applies only to samples taken after fasting for at least 8 hours.  CBC with Differential/Platelet     Status: Abnormal   Collection Time: 05/29/21  4:22 AM  Result Value Ref Range   WBC 9.8 4.0 - 10.5 K/uL   RBC 2.90 (L) 3.87 - 5.11 MIL/uL   Hemoglobin 8.4 (L) 12.0 - 15.0 g/dL   HCT 27.3 (L) 36.0 - 46.0 %   MCV 94.1 80.0 - 100.0 fL   MCH 29.0 26.0 - 34.0 pg   MCHC 30.8 30.0 - 36.0 g/dL   RDW 20.9 (H) 11.5 - 15.5 %   Platelets 184 150 - 400 K/uL   nRBC 2.1 (H) 0.0 - 0.2 %   Neutrophils Relative % 90 %   Neutro Abs 8.8 (H) 1.7 - 7.7 K/uL   Lymphocytes Relative 4 %   Lymphs Abs 0.4 (L) 0.7 - 4.0 K/uL   Monocytes Relative 5 %   Monocytes Absolute 0.5 0.1 - 1.0 K/uL   Eosinophils Relative 0 %   Eosinophils Absolute 0.0 0.0 -  0.5 K/uL   Basophils Relative 0 %   Basophils Absolute 0.0 0.0 - 0.1 K/uL   Immature Granulocytes 1 %   Abs Immature Granulocytes 0.06 0.00 - 0.07 K/uL    Comment: Performed at [facility], [address] [place], [place] [address]  Renal function panel (daily at 0500)     Status: Abnormal   Collection Time: 05/29/21  4:22 AM  Result Value Ref Range   Sodium 137 135 - 145 mmol/L   Potassium 4.2 3.5 - 5.1 mmol/L   Chloride 105 98 - 111 mmol/L   CO2 26 22 - 32 mmol/L   Glucose, Bld 143 (H) 70 - 99 mg/dL    Comment: Glucose reference range applies only to samples taken after fasting for at least 8 hours.   BUN 37 (H) 8 - 23 mg/dL   Creatinine, Ser 1.33 (H) 0.44 - 1.00 mg/dL   Calcium 9.1 8.9 - 10.3 mg/dL   Phosphorus 3.5 2.5 - 4.6 mg/dL   Albumin 3.1 (L) 3.5 - 5.0 g/dL   GFR, Estimated 43 (L) >60 mL/min    Comment: (NOTE) Calculated using the CKD-EPI Creatinine Equation (2021)    Anion gap 6 5 - 15    Comment: Performed at [facility], [address], [place], [place] [address]  Magnesium     Status: Abnormal   Collection Time: 05/29/21  4:22 AM  Result Value Ref Range   Magnesium 2.5 (H) 1.7 - 2.4 mg/dL    Comment: Performed at [facility], [address] [place], [place] [address]  Glucose, capillary     Status: Abnormal   Collection Time: 05/29/21  7:28 AM  Result Value Ref Range   Glucose-Capillary 113 (H) 70 - 99 mg/dL    Comment: Glucose reference range applies only to samples taken after fasting for at least 8 hours.  Glucose, capillary     Status: Abnormal   Collection Time: 05/29/21 11:45 AM  Result Value Ref Range   Glucose-Capillary 130 (H) 70 - 99 mg/dL    Comment:  Glucose reference range applies only to samples taken after fasting for at least 8 hours.  Glucose, capillary     Status: Abnormal   Collection Time: 05/29/21  3:30 PM  Result Value Ref Range   Glucose-Capillary 137 (H) 70 - 99 mg/dL    Comment: Glucose reference  range applies only to samples taken after fasting for at least 8 hours.  Renal function panel (daily at 1600)     Status: Abnormal   Collection Time: 05/29/21  3:31 PM  Result Value Ref Range   Sodium 137 135 - 145 mmol/L   Potassium 4.3 3.5 - 5.1 mmol/L   Chloride 104 98 - 111 mmol/L   CO2 25 22 - 32 mmol/L   Glucose, Bld 147 (H) 70 - 99 mg/dL    Comment: Glucose reference range applies only to samples taken after fasting for at least 8 hours.   BUN 37 (H) 8 - 23 mg/dL   Creatinine, Ser 1.34 (H) 0.44 - 1.00 mg/dL   Calcium 9.0 8.9 - 10.3 mg/dL   Phosphorus 3.6 2.5 - 4.6 mg/dL   Albumin 2.9 (L) 3.5 - 5.0 g/dL   GFR, Estimated 42 (L) >60 mL/min    Comment: (NOTE) Calculated using the CKD-EPI Creatinine Equation (2021)    Anion gap 8 5 - 15    Comment: Performed at [facility], [address], [place], [place] [address]  . PERIPHERAL VASCULAR CATHETERIZATION  Result Date: 05/29/2021 See surgical note for result. .   ASSESSMENT: Status post tracheostomy.  Her wound is stable overall though very slowly granulating in.  The new tracheostomy tube does appear to be better and preventing any cuff leaks. PLAN: Continue wound care with gentamicin impregnated packing.  I will be out for medical procedure later this week so please consult on call ENT if there are any issues in the interim.  [name], MD 05/29/2021 5:10 PM

## 2021-05-29 NOTE — Consult Note (Signed)
PHARMACY CONSULT NOTE  Pharmacy Consult for Electrolyte Monitoring and Replacement   Recent Labs: Potassium (mmol/L)  Date Value  05/29/2021 4.2  08/11/2013 3.3 (L)   Magnesium (mg/dL)  Date Value  05/29/2021 2.5 (H)   Calcium (mg/dL)  Date Value  05/29/2021 9.1   Calcium, Total (mg/dL)  Date Value  08/11/2013 9.2   Albumin (g/dL)  Date Value  05/29/2021 3.1 (L)  08/11/2013 3.1 (L)   Phosphorus (mg/dL)  Date Value  05/29/2021 3.5   Sodium (mmol/L)  Date Value  05/29/2021 137  08/11/2013 138   Assessment: 71 yo female with a previous medical history of diabetes mellitus type 2, hypertension, obstructive sleep apnea on CPAP, asthma/COPD, GERD, history of COVID-19 pneumonia presenting with lower left extremity pain and chills. Patient found to have severe sepsis and strep pyogenes bacteremia on admission and is currently intubated and sedated. LLL necrotizing fascitis. Small [facility]  Pharmacy has been consulted to monitor and replace electrolytes.  -CRRT stopped 2/6, re-started (2/8>> -s/p trach 2/3, tolerating trach collar trials  Nutrition:  --Feeds per tube @65ml /hr --Free water per tube 30 mL Q4H  Goal of Therapy:  Electrolytes within normal limits  Plan:  Continues on CRRT No electrolyte replacement warranted for today q12h Renal function panels while on CRRT  Vallery Sa, PharmD, BCPS Clinical Pharmacist 05/29/2021 7:09 AM

## 2021-05-29 NOTE — Interval H&P Note (Signed)
History and Physical Interval Note:  05/29/2021 10:38 AM  Alexandra Foster  has presented today for surgery, with the diagnosis of multisystem organ dysfunction,high risk for DVT; anemia precludes anticoagulation.  The various methods of treatment have been discussed with the patient and family. After consideration of risks, benefits and other options for treatment, the patient has consented to  Procedure(s): IVC FILTER INSERTION (N/A) as a surgical intervention.  The patient's history has been reviewed, patient examined, no change in status, stable for surgery.  I have reviewed the patient's chart and labs.  Questions were answered to the patient's satisfaction.     Hortencia Pilar

## 2021-05-29 NOTE — Progress Notes (Signed)
Nutrition Follow Up Note   DOCUMENTATION CODES:   Morbid obesity  INTERVENTION:   Pivot 1.5_0 /hr- Initiate at 66m/hr and increase by 181mhr q 8 hours until goal rate is reached.   Free water flushes 3061m4 hours to maintain tube patency   Regimen provides 2340kcal/day, 146g/day protein and 1364m51my of free water   Continue Juven Fruit Punch BID via tube, each serving provides 95kcal and 2.5g of protein (amino acids glutamine and arginine)  Rena-vit daily via tube   Vitamin C 500mg54m via tube   Zinc 220mg 45my via tube x 14 days  Vitamin A 10,000 units daily via tube   NUTRITION DIAGNOSIS:   Increased nutrient needs related to wound healing as evidenced by estimated needs.  GOAL:   Provide needs based on ASPEN/SCCM guidelines -previously met with tube feeds   MONITOR:   Labs, Weight trends, TF tolerance, Skin, I & O's, Vent status  ASSESSMENT:   70 y.o78female with a history of asthma/COPD, HTN, DMII, obesity, OSA on CPAP, GERD and COVID-19 pneumonia who was admitted 1/22 w/ left LE cellulitis, septic shock, S pyogenes bacteremia, hypotension, new Afib and AKI.  Pt s/p I & D 1/24 Pt s/p I & D with veraflow placement 1/27 Pt s/p Left IJ catheter and CRRT 1/28 Pt s/p CT head 1/31; noted to have small SAH  Pt s/p tracheostomy and cellular matrix placement 2/3  Pt s/p surgically placed PEG tube 2/15  Pt remains ventilated via trach with intermittent periods on trach collar. PEG tube placed by surgery yesterday. NGT removed. Plan is for IVC filter today. Will resume tube feeds this afternoon after procedure; will increase slowly to goal as pt noted to previously have some vomiting requiring Erythromycin. Pt remains on CRRT; plan is to switch to HD after procedures. Per chart, pt is down 11lbs from her UBW but appears to be weight stable for the past two weeks. Pt -12.9L on her I & Os. Edema improved.   Medications reviewed and include: darbepoetin, solu-cortef,  vitamin C, insulin, rena-vit, juven, protonix, zinc, vitamin A, levophed, zosyn   Labs reviewed: Na 137 wnl, K 4.2 wnl, BUN 37(H), creat 1.33(H), P 3.5 wnl, Mg 2.5(H) Hgb 8.4(L), Hct 27.3(L) Cbgs- 113, 137 x 24hrs  Patient is currently intubated on ventilator support MV: 8.8 L/min Temp (24hrs), Avg:97.8 F (36.6 C), Min:97.3 F (36.3 C), Max:98.1 F (36.7 C)  Propofol: none   MAP- >65mmHg85miet Order:   Diet Order     None      EDUCATION NEEDS:   Education needs have been addressed  Skin:  Skin Assessment: Reviewed RN Assessment (40 x 35 cm left leg wound, Stage I coccyx, new wounds left arm), VAC  Last BM:  2/14- type 7 (200ml)  46mht:   Ht Readings from Last 1 Encounters:  05/27/21 _1  (1.651 m)    Weight:   Wt Readings from Last 1 Encounters:  05/29/21 117.1 kg    Ideal Body Weight:  56.8 kg  BMI:  Body mass index is 42.96 kg/m.  Estimated Nutritional Needs:   Kcal:  2200-2500kcal/day  Protein:  120-140 grams  Fluid:  1.7-2.0L/day  Trinidi Toppins CaKoleen Distance LDN Please refer to AMION foMerrimack Valley Endoscopy Centerand/or RD on-call/weekend/after hours pager

## 2021-05-29 NOTE — Progress Notes (Signed)
Patient ID: Alexandra Foster, female   DOB: 11/14/1950, 71 y.o.   MRN: [id number]     [facility] Day(s): 25.   Interval History: Patient seen and examined, no acute events or new complaints overnight.  Vital signs in last 24 hours: [min-max] current  Temp:  [97.3 F (36.3 C)-98.1 F (36.7 C)] 98.1 F (36.7 C) (02/15 0700) Pulse Rate:  [56-82] 71 (02/15 0900) Resp:  [12-27] 19 (02/15 0900) BP: (71-148)/(37-64) 92/44 (02/15 0900) SpO2:  [89 %-100 %] 98 % (02/15 0900) FiO2 (%):  [30 %-100 %] 30 % (02/15 0804) Weight:  [117.1 kg] 117.1 kg (02/15 0500)     Height: '5\' 5"'  (165.1 cm) Weight: 117.1 kg BMI (Calculated): 42.96   Physical Exam:  Constitutional: alert, cooperative and no distress  Extremity: Left leg Negative pressure dressing in place with adequate seal.   Labs:  CBC Latest Ref Rng & Units 05/29/2021 05/28/2021 05/28/2021  WBC 4.0 - 10.5 K/uL 9.8 - 13.7(H)  Hemoglobin 12.0 - 15.0 g/dL 8.4(L) 7.6(L) 6.8(L)  Hematocrit 36.0 - 46.0 % 27.3(L) 24.8(L) 22.1(L)  Platelets 150 - 400 K/uL 184 - 183   CMP Latest Ref Rng & Units 05/29/2021 05/28/2021 05/28/2021  Glucose 70 - 99 mg/dL 143(H) 131(H) 135(H)  BUN 8 - 23 mg/dL 37(H) 43(H) 54(H)  Creatinine 0.44 - 1.00 mg/dL 1.33(H) 1.09(H) 1.34(H)  Sodium 135 - 145 mmol/L 137 136 136  Potassium 3.5 - 5.1 mmol/L 4.2 3.8 3.8  Chloride 98 - 111 mmol/L 105 102 104  CO2 22 - 32 mmol/L '26 25 25  ' Calcium 8.9 - 10.3 mg/dL 9.1 9.2 9.0  Total Protein 6.5 - 8.1 g/dL - - -  Total Bilirubin 0.3 - 1.2 mg/dL - - -  Alkaline Phos 38 - 126 U/L - - -  AST 15 - 41 U/L - - -  ALT 0 - 44 U/L - - -    Imaging studies: No new pertinent imaging studies   Assessment/Plan:  71 y.o. female with left leg necrotizing fasciitis with 24 Day Post-Op s/p debridement, complicated by pertinent comorbidities including strep pyogenes bacteremia now in septic shock (resolved), respiratory failure mechanical ventilation (resolved), liver failure  (resolved), acute renal failure on CRRT, COPD, type 2 diabetes mellitus, hypertension, obstructive sleep apnea, GERD   Left leg necrotizing fasciitis -S/p debridement 05/07/2021 -Second debridement on 05/10/2021.  Application of wound VAC system -S/p myriad matrix placement on 05/17/2021 -Negative pressure dressing working adequately without complication. Plant for change on Friday.  -I will continue to follow along.  Arnold Long, MD

## 2021-05-29 NOTE — Progress Notes (Signed)
PT Cancellation Note  Patient Details Name: Pricila Bridge MRN: [id number] DOB: Sep 16, 1950   Cancelled Treatment:    Reason Eval/Treat Not Completed: Other (comment). Pt able to open eyes on command, shake head yes/no, squeeze R hand. Attempted RLE exercises, pt shakes her head, does not actively participate in exercises (x10 ankle pumps, x5 heel slides) despite education and extra time. PT to re-attempt as able.  Lieutenant Diego PT, DPT 2:58 PM,05/29/21

## 2021-05-29 NOTE — Progress Notes (Addendum)
OT Cancellation Note  Patient Details Name: Alexandra Foster MRN: [id number] DOB: 1951/01/13   Cancelled Treatment:    Reason Eval/Treat Not Completed: Patient at procedure or test/ unavailable Pt still OTF s/p IVC filter placement. Will f/u for OT tx at later date/time as able. Thank you.  Gerrianne Scale, [place], OTR/L ascom [phone] 05/29/21, 11:29 AM

## 2021-05-29 NOTE — Progress Notes (Signed)
Patient will open eyes to voice, follows commands. Patient refuses mouth care. Daughter in room several times and unable to get patient to agree to mouth care. Explained the importance, patient swats arms, hands an shakes her head. Did allow me once to perform lip care. Sinus and bradycardia throughout the shift. Tube feeds ordered for this evening. Lexapro and oxycodone scheduled for patient. Rectal tube intact, wound vac intact. IVC filter placed this afternoon by Dr. Delana Meyer. CRRT running with no complications. Continue to assess.

## 2021-05-29 NOTE — Progress Notes (Signed)
CRRT Restarted with no complications.

## 2021-05-29 NOTE — Progress Notes (Signed)
[facility]  PROGRESS NOTE   Subjective:   CRRT continued. UF goal of net even. UOP minimal. Blood flow rate 225-250 cc/min Norepinephrine gtt continued PRBC transfusion over the weekend and tuesday Underwent PEG and wound vac change on tuesday   Objective:  Vital signs in last 24 hours:  Temp:  [97.3 F (36.3 C)-98.9 F (37.2 C)] 98.9 F (37.2 C) (02/15 1200) Pulse Rate:  [53-82] 70 (02/15 1200) Resp:  [12-27] 23 (02/15 1200) BP: (71-163)/(37-76) 107/58 (02/15 1200) SpO2:  [91 %-100 %] 97 % (02/15 1200) FiO2 (%):  [30 %-100 %] 30 % (02/15 1125) Weight:  [117.1 kg] 117.1 kg (02/15 0500)  Weight change: -1.4 kg Filed Weights   05/27/21 0500 05/28/21 0455 05/29/21 0500  Weight: 111.5 kg 118.5 kg 117.1 kg    Intake/Output: I/O last 3 completed shifts: In: 1431.6 [I.V.:388.7; Blood:402; Other:30; NG/GT:110; IV Piggyback:500.9] Out: 1902 [Emesis/NG output:90; Drains:100; XJOIT:2549; Stool:350]   Intake/Output this shift:  Total I/O In: 13.8 [I.V.:9.9; IV Piggyback:3.8] Out: 165 [Drains:85; Other:30; Stool:50]  Physical Exam: General:  Critically ill  Head:  [place]/AT  Eyes:  Anicteric  Neck:  Trachea collar, tracheostomy, vent assisted  Lungs:   diminished bilaterally,    Heart:  regular,  Abdomen:   Soft, nontender  Extremities:  Left lower extremity wound vac  Neurologic: Sedated  Skin:  No lesions  Access: Left IJ temp HD catheter    Basic Metabolic Panel: Recent Labs  Lab 05/25/21 0459 05/25/21 1600 05/26/21 0514 05/26/21 1358 05/27/21 0455 05/27/21 1551 05/28/21 0439 05/28/21 1601 05/29/21 0422  NA 135   < > 132*   < > 136 134* 136 136 137  K 4.3   < > 3.6   < > 3.6 3.6 3.8 3.8 4.2  CL 100   < > 98   < > 99 101 104 102 105  CO2 21*   < > 25   < > '26 25 25 25 26  ' GLUCOSE 228*   < > 214*   < > 256* 198* 135* 131* 143*  BUN 50*   < > 51*   < > 47* 57* 54* 43* 37*  CREATININE 1.56*   < > 1.47*   < > 1.36* 1.34* 1.34* 1.09* 1.33*  CALCIUM  9.3   < > 9.2   < > 9.3 9.1 9.0 9.2 9.1  MG 2.1  --  1.9  --  2.0  --  2.2  --  2.5*  PHOS 3.0   < > 2.2*   < > 2.3* 3.7 3.3 3.8 3.5   < > = values in this interval not displayed.     CBC: Recent Labs  Lab 05/25/21 0459 05/26/21 0514 05/26/21 1215 05/27/21 0455 05/28/21 0439 05/28/21 1127 05/29/21 0422  WBC 14.9* 20.8*  --  18.4* 13.7*  --  9.8  NEUTROABS 13.7* 19.1*  --  16.5* 12.2*  --  8.8*  HGB 7.4* 5.9* 8.0* 7.5* 6.8* 7.6* 8.4*  HCT 24.0* 19.6* 25.8* 24.2* 22.1* 24.8* 27.3*  MCV 92.7 94.7  --  94.5 97.4  --  94.1  PLT 292 221  --  148* 183  --  184      Urinalysis: No results for input(s): COLORURINE, LABSPEC, PHURINE, GLUCOSEU, HGBUR, BILIRUBINUR, KETONESUR, PROTEINUR, UROBILINOGEN, NITRITE, LEUKOCYTESUR in the last 72 hours.  Invalid input(s): APPERANCEUR    Imaging: No results found.   Medications:     prismasol BGK 4/2.5 400 mL/hr at 05/29/21 458 172 [id number]  prismasol BGK 4/2.5 400 mL/hr at 05/29/21 0416   [START ON 05/30/2021]  ceFAZolin (ANCEF) IV     feeding supplement (PIVOT 1.5 CAL)     norepinephrine (LEVOPHED) Adult infusion 6 mcg/min (05/29/21 1032)   piperacillin-tazobactam (ZOSYN)  IV 3.375 g (05/29/21 1018)   prismasol BGK 4/2.5 1,000 mL/hr at 05/29/21 0720    amiodarone  200 mg Per Tube Daily   vitamin C  500 mg Per Tube BID   chlorhexidine gluconate (MEDLINE KIT)  15 mL Mouth Rinse BID   Chlorhexidine Gluconate Cloth  6 each Topical Q0600   Darbepoetin Alfa  100 mcg Subcutaneous Q7 days   fentaNYL       free water  30 mL Per Tube Q4H   gentamicin irrigation  80 mg Irrigation QID   hydrocortisone sod succinate (SOLU-CORTEF) inj  100 mg Intravenous Q12H   insulin aspart  0-20 Units Subcutaneous Q4H   insulin aspart  4 Units Subcutaneous Q4H   insulin detemir  17 Units Subcutaneous BID   magic mouthwash w/lidocaine  10 mL Oral QID   mouth rinse  15 mL Mouth Rinse 10 times per day   midazolam       midodrine  10 mg Per Tube TID WC   multivitamin   1 tablet Per Tube QHS   nutrition supplement (JUVEN)  1 packet Per Tube BID BM   pantoprazole (PROTONIX) IV  40 mg Intravenous Q24H   sodium chloride flush  10-40 mL Intracatheter Q12H   vitamin A  10,000 Units Per Tube Daily   zinc sulfate  220 mg Per Tube Daily    Assessment/ Plan:     Principal Problem:   Severe sepsis with septic shock (CODE) (HCC) Active Problems:   COPD (chronic obstructive pulmonary disease) (HCC)   Morbid obesity (HCC)   Diabetes mellitus type 2 in obese (HCC)   Atrial fibrillation and flutter (HCC)   Necrotizing fasciitis ([place])   Acute on chronic respiratory failure (HCC)   On mechanically assisted ventilation (HCC)   Shock liver   Anemia associated with acute blood loss   Metabolic acidosis   Encounter for continuous renal replacement therapy (CRRT) for acute renal failure (HCC)   Acute encephalopathy   Group A streptococcal infection   Streptococcal toxic shock syndrome ([place])   Status post tracheostomy ([place])   Subdural hemorrhage (HCC)   Left leg cellulitis  Alexandra Foster is a 71 y.o. black female with COPD/asthma, diabetes mellitus type II, hypertension, obstructive sleep apnea, paroxysmal atrial fibrillation, GERD, who presents to [facility] on 05/04/2021 for AKI (acute kidney injury) ([place]) [N17.9] Left leg cellulitis [L03.116] Severe sepsis ([place]) [A41.9, R65.20] Severe sepsis with lactic acidosis ([place]) [A41.9, R65.20, E87.20] Found to have necrotizing fascitis requiring multiple debridements by Dr. Peyton Najjar: 1/24. 1/27, 2/3 and 2/10.    #1: Acute kidney injury: AKI is secondary to ischemic nephropathy secondary to hypotension complicated with sepsis. History of bland urine. Baseline creatinine of 0.98, GFR > 60 on 02/11/21.  -Anuric at present.  - Continue continuous renal replacement therapy. Net even ultrafiltration.  - IV albumin supplementation - Continue on 4 K bath - too unstable for IHD today, re-evaluate tomorrow   #2: Sepsis and  hypotension: necrotizing fasciitis for left lower extremity status post multiple debridement.  Requiring vasopressors: norepinephrine. On stress dose steroids and midodrine -Antibiotics as per ID and primary team  #3. Anemia with Acute kidney injury:  Lab Results  Component Value Date   HGB 8.4 (  L) 05/29/2021    S/p blood transfusions this admission Aranesp weekly SQ (mon)  #4. Acute Respiratory Failure Ventilator assisted. Management as per ICU team    LOS: [place], MD [facility] 2/15/202312:35 PM

## 2021-05-30 ENCOUNTER — Encounter: Payer: Self-pay | Admitting: General Surgery

## 2021-05-30 DIAGNOSIS — Z7189 Other specified counseling: Secondary | ICD-10-CM

## 2021-05-30 DIAGNOSIS — J9601 Acute respiratory failure with hypoxia: Secondary | ICD-10-CM | POA: Diagnosis not present

## 2021-05-30 DIAGNOSIS — J9602 Acute respiratory failure with hypercapnia: Secondary | ICD-10-CM | POA: Diagnosis not present

## 2021-05-30 LAB — RENAL FUNCTION PANEL
Albumin: 2.7 g/dL — ABNORMAL LOW (ref 3.5–5.0)
Albumin: 2.5 g/dL — ABNORMAL LOW (ref 3.5–5.0)
Anion gap: 7 (ref 5–15)
Anion gap: 5 (ref 5–15)
BUN: 36 mg/dL — ABNORMAL HIGH (ref 8–23)
BUN: 32 mg/dL — ABNORMAL HIGH (ref 8–23)
CO2: 26 mmol/L (ref 22–32)
CO2: 23 mmol/L (ref 22–32)
Calcium: 9 mg/dL (ref 8.9–10.3)
Calcium: 7.8 mg/dL — ABNORMAL LOW (ref 8.9–10.3)
Chloride: 104 mmol/L (ref 98–111)
Chloride: 108 mmol/L (ref 98–111)
Creatinine, Ser: 1.27 mg/dL — ABNORMAL HIGH (ref 0.44–1.00)
Creatinine, Ser: 1.14 mg/dL — ABNORMAL HIGH (ref 0.44–1.00)
GFR, Estimated: 45 mL/min — ABNORMAL LOW (ref >60)
GFR, Estimated: 51 mL/min — ABNORMAL LOW (ref >60)
Glucose, Bld: 209 mg/dL — ABNORMAL HIGH (ref 70–99)
Glucose, Bld: 233 mg/dL — ABNORMAL HIGH (ref 70–99)
Phosphorus: 3.1 mg/dL (ref 2.5–4.6)
Phosphorus: 2.3 mg/dL — ABNORMAL LOW (ref 2.5–4.6)
Potassium: 4.5 mmol/L (ref 3.5–5.1)
Potassium: 4.2 mmol/L (ref 3.5–5.1)
Sodium: 137 mmol/L (ref 135–145)
Sodium: 136 mmol/L (ref 135–145)

## 2021-05-30 LAB — CBC WITH DIFFERENTIAL/PLATELET
Abs Immature Granulocytes: 0.05 10*3/uL (ref 0.00–0.07)
Basophils Absolute: 0 10*3/uL (ref 0.0–0.1)
Basophils Relative: 0 %
Eosinophils Absolute: 0 10*3/uL (ref 0.0–0.5)
Eosinophils Relative: 0 %
HCT: 28.1 % — ABNORMAL LOW (ref 36.0–46.0)
Hemoglobin: 8.7 g/dL — ABNORMAL LOW (ref 12.0–15.0)
Immature Granulocytes: 1 %
Lymphocytes Relative: 3 %
Lymphs Abs: 0.3 10*3/uL — ABNORMAL LOW (ref 0.7–4.0)
MCH: 29.3 pg (ref 26.0–34.0)
MCHC: 31 g/dL (ref 30.0–36.0)
MCV: 94.6 fL (ref 80.0–100.0)
Monocytes Absolute: 0.4 10*3/uL (ref 0.1–1.0)
Monocytes Relative: 4 %
Neutro Abs: 8.7 10*3/uL — ABNORMAL HIGH (ref 1.7–7.7)
Neutrophils Relative %: 92 %
Platelets: 199 10*3/uL (ref 150–400)
RBC: 2.97 MIL/uL — ABNORMAL LOW (ref 3.87–5.11)
RDW: 20.3 % — ABNORMAL HIGH (ref 11.5–15.5)
WBC: 9.4 10*3/uL (ref 4.0–10.5)
nRBC: 1.4 % — ABNORMAL HIGH (ref 0.0–0.2)

## 2021-05-30 LAB — GLUCOSE, CAPILLARY
Glucose-Capillary: 175 mg/dL — ABNORMAL HIGH (ref 70–99)
Glucose-Capillary: 229 mg/dL — ABNORMAL HIGH (ref 70–99)
Glucose-Capillary: 171 mg/dL — ABNORMAL HIGH (ref 70–99)
Glucose-Capillary: 211 mg/dL — ABNORMAL HIGH (ref 70–99)
Glucose-Capillary: 190 mg/dL — ABNORMAL HIGH (ref 70–99)
Glucose-Capillary: 233 mg/dL — ABNORMAL HIGH (ref 70–99)

## 2021-05-30 LAB — MAGNESIUM: Magnesium: 2.6 mg/dL — ABNORMAL HIGH (ref 1.7–2.4)

## 2021-05-30 MED ORDER — HYDROMORPHONE HCL 1 MG/ML IJ SOLN
1.0000 mg | Freq: Once | INTRAMUSCULAR | Status: AC
  Administered 2021-05-30: 1 mg via INTRAVENOUS
  Filled 2021-05-30: qty 1

## 2021-05-30 MED ORDER — CLONAZEPAM 0.5 MG PO TABS
0.5000 mg | ORAL_TABLET | Freq: Two times a day (BID) | ORAL | Status: DC | PRN
Start: 2021-05-30 — End: 2021-06-13
  Administered 2021-05-31 – 2021-06-01 (×2): 0.5 mg
  Filled 2021-05-30 (×2): qty 1

## 2021-05-30 MED ORDER — CLINDAMYCIN PHOSPHATE 600 MG/50ML IV SOLN: 600.0000 mg | Freq: Three times a day (TID) | INTRAVENOUS | Status: DC

## 2021-05-30 MED ORDER — IPRATROPIUM-ALBUTEROL 0.5-2.5 (3) MG/3ML IN SOLN
3.0000 mL | Freq: Four times a day (QID) | RESPIRATORY_TRACT | Status: DC
  Administered 2021-05-30 – 2021-06-06 (×26): 3 mL via RESPIRATORY_TRACT
  Filled 2021-05-30 (×27): qty 3

## 2021-05-30 MED ORDER — ALBUTEROL SULFATE (2.5 MG/3ML) 0.083% IN NEBU
2.5000 mg | INHALATION_SOLUTION | RESPIRATORY_TRACT | Status: DC | PRN
Start: 2021-05-30 — End: 2021-06-13
  Administered 2021-06-09: 2.5 mg via RESPIRATORY_TRACT
  Filled 2021-05-30: qty 3

## 2021-05-30 MED ORDER — DEXMEDETOMIDINE HCL IN NACL 400 MCG/100ML IV SOLN
0.4000 ug/kg/h | INTRAVENOUS | Status: DC
  Administered 2021-05-30: 0.4 ug/kg/h via INTRAVENOUS
  Filled 2021-05-30: qty 100

## 2021-05-30 NOTE — Progress Notes (Signed)
CRRT Stopped, high filter pressures.

## 2021-05-30 NOTE — Progress Notes (Signed)
NAME:  Alexandra Foster, MRN:  [id number], DOB:  [birth year], LOS: 48 ADMISSION DATE:  05/04/2021, CONSULTATION DATE:  05/04/2021 REFERRING MD:  Duffy Bruce MD CHIEF COMPLAINT:  Left leg pain   HPI  71 y.o female with medical history significant of Asthma/COPD, T2DM, HTN, OSA on CPAP, GERD, COVID-19 pneumonia, and Bilateral lower extremity edema who presented to the ED with chief complaints of LLE pain and chills since Thursday.  Patient report onset of symptoms for the past several days with worsening pain, swelling, fevers and chills, poor po intake and difficulty walking since Thursday. Denies injury to extremity or hx of cellulitis.  ED Course: In the emergency department, the temperature was 37.7C, the heart rate 103 beats/minute, the blood pressure 75/64  mm Hg, the respiratory rate 27 breaths/minute, and the oxygen saturation 99% on RA. CT of left lower leg showed diffuse subcutaneous edema concerning for cellulitis with no fluid collection/abscess. Pertinent Labs in Red/Diagnostics Findings: Na+/ K+: 141/2.9 Glucose: 144 BUN/Cr.: 44/3.47 AST/ALT:67/48   WBC/ TMAX: 16.5/ afebrile PCT: pending Lactic acid: 3.8>6.0 COVID PCR: Negative  Patient given 30 cc/kg of fluids and started on broad-spectrum antibiotics for sepsis with septic shock. Patient remained hypotensive despite IVF boluses therefore was started on Levophed. PCCM consulted.  Past Medical History    Arthritis   Asthma   COPD (chronic obstructive pulmonary disease) ([place])   Diabetes mellitus without complication ([place])   Endometriosis   GERD (gastroesophageal reflux disease)   Hypertension   Obesity   Sleep apnea    CPAP   Significant Hospital Events   1/21: Admitted to the ICU with severe sepsis with shock secondary to cellulitis of LLE 1/23: off pressors +Afib with RVR 1/24: OR for fasciotomy 1/25: post op resp failure with severe septic shock 1/26: remains on pressors 1/27: returns to the OR today for  debridement, wound VAC 1/28: overnight with hemodynamic deterioration, required reintubation, currently on pressors 1/29: remains on 3 pressors, CRRT, wound VAC and minimal sedation.  Awakens to voice and touch.  Seen by vascular surgery and awaiting input from general surgery regarding amputation of left leg 2/1: remains on pressors, on CRRT, MRI/CT shows SAH, afib with RVR, ECHO pending, restarted AMIO at 60 mg/hr 2/3: remains on crrt, remains on vent, plan for [facility] and wound vac change in OR 2/3: S/P TRACH, omplantation of cellular matrix on left leg 2/4: REMAINS ON PRESSORS, REMAINS ON VENT, REMAINS ON CRRT 2/5: REMAINS ON VENT, OFF PRESSORS, REMAINS ON CRRT 2/6: Patient is improved she is able to follow communication verbally and move extermities. She is on 5L/min Trache collar. We have stopped levophed and working on reducing vasopressin. She remains on CRRT.  Insulin infusion is ongoing and plan to have weight based regimen initiated. Additionally she will have SLP for vocalization and PMV trial as well as ROM training with OT/PT.  Multiple specialists on case appreciate everyone involved.  2/7: patient is improved mildly , she's off CRRT, shes off insulin drip. She is mentating.  She was evaluated by [facility].  She was unable to vocalize with PMV.  She may need PEG tube for nourishment.  2/8: patient is overnight worsened with fever and increased levophed dose.  She is negative 1.7L.   She is on zosyn.  2/9: patient is improved this am. She is giving thumbs up during examination. 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 She seems volume depleted  as well so we will start CVP monitoring and give IV LR 1L bolus today to check for volume responsiveness. Albumin also while on CRRT. PT/OT starting today. Type and screen repeat due to plan for OR in am. On zosyn with ID following. Starting  heparin today.   2/10: patient is s/p repeat surgery on RLE. She is on levophed 70mg post surgery. She is not tachycardic and able to follow some verbal communication.  2/11: patient stable overnight. Reordering PT/OT today. Wbc count slightly trending up remains on abx probably post surgical.  Remains severely anemic on CRRT and vasopressor support with tracheostomy.  2/12: patient with severe anemia today.  Reviewed with renal team for blood transfusion today.  2/13: remains critically ill, on pressors and CRRT 2/13: trach changed for a #8 cuffed distal XLT without difficulty. Wound cleaner. PER ENT 2/14: remains on pressors, CRRT, VENT, plan for PEG and WOUND vac change at bedside; received 1 unit of PRBC's due to anemia  2/15: s/p IVC filter placement due to inability to tolerate anticoagulation  2/16: plan for Trach collar trials as tolerated.  Continues on CRRT. Pt refusing nursing care and to work with PT/OT, Palliative Care re-consulted  Consults:  PCCM General Surgery  Nephrology ID  Speech  ENT   Significant Diagnostic Tests:  1/21: Chest Xray> no active cardiopulmonary process 1/21: CT left lower leg>Diffuse subcutaneous edema may represent cellulitis. No drainable fluid collection/abscess 1/21: Ultrasound lower unilateral left> no DVT  Micro Data:  1/21: SARS-CoV-2 PCR>>negative 1/21: Influenza PCR>>negative 1/21: Blood culture x2>>group A strep (strep pyogenes) 1/21: Urine Culture>negative  1/21: MRSA PCR>>negative  1/24: Left leg wound culture>>negative  2/7: Trach site would culture>>few pseudomonas aeruginosa and few candida albicans   Antimicrobials:  Vancomycin 1/21>>1/22 Cefepime 1/21>>01/22 Clindamycin 1/22>>1/25 Ceftriaxone 1/21 X1; restarted 1/31>>2/6 Penicillin G 1/22>>1/31 Linezolid 1/25>>1/31 Metronidazole 02/6 x1 dose Unasyn 2/7 Cefazolin 02/16>>  OBJECTIVE  Blood pressure 113/60, pulse 66, temperature 98.2 F (36.8 C), resp. rate 20, height '5\' 5"'  (1.651 m),  weight 116.1 kg, SpO2 100 %.    Vent Mode: PRVC FiO2 (%):  [30 %] 30 % Set Rate:  [20 bmp] 20 bmp Vt Set:  [460 mL] 460 mL PEEP:  [5 cmH20] 5 cmH20 Plateau Pressure:  [14 cmH20-15 cmH20] 14 cmH20   Intake/Output Summary (Last 24 hours) at 05/30/2021 [id number] data filed at 05/30/2021 0800 Gross per 24 hour  Intake 903.99 ml  Output 994 ml  Net -90.01 ml    Filed Weights   05/28/21 0455 05/29/21 0500 05/30/21 0500  Weight: 118.5 kg 117.1 kg 116.1 kg     Physical Examination  General: acute on chronically ill appearing female, NAD resting in bed on vent HENT: supple, no JVD, size 8 XLT tracheostomy present dressing dry/intact  Lungs: diminished with faint throughout, even, non labored  Cardiovascular: nsr, rrr, no R/G, 2+ radial/2+ distal pulses Abdomen: +BS x4, obese, soft, non tender, non distended  Extremities: LLE negative pressure dressing present clean dry and intact Neuro: Awake, follows commands, generalized weakness (but no focal deficits) PERRL.  Affect is very flat GU: deferred anuric  Skin: see below    Labs/imaging that I havepersonally reviewed  (right click and "Reselect all SmartList Selections" daily)  All labs reviewed 05/30/2021   Labs   CBC: Recent Labs  Lab 05/26/21 0514 05/26/21 1215 05/27/21 0455 05/28/21 0439 05/28/21 1127 05/29/21 0422 05/30/21 0430  WBC 20.8*  --  18.4* 13.7*  --  9.8 9.4  NEUTROABS  19.1*  --  16.5* 12.2*  --  8.8* 8.7*  HGB 5.9*   < > 7.5* 6.8* 7.6* 8.4* 8.7*  HCT 19.6*   < > 24.2* 22.1* 24.8* 27.3* 28.1*  MCV 94.7  --  94.5 97.4  --  94.1 94.6  PLT 221  --  148* 183  --  184 199   < > = values in this interval not displayed.     Basic Metabolic Panel: Recent Labs  Lab 05/26/21 0514 05/26/21 1358 05/27/21 0455 05/27/21 1551 05/28/21 0439 05/28/21 1601 05/29/21 0422 05/29/21 1531 05/30/21 0430  NA 132*   < > 136   < > 136 136 137 137 137  K 3.6   < > 3.6   < > 3.8 3.8 4.2 4.3 4.5  CL 98   < > 99   < > 104  102 105 104 104  CO2 25   < > 26   < > '25 25 26 25 26  ' GLUCOSE 214*   < > 256*   < > 135* 131* 143* 147* 209*  BUN 51*   < > 47*   < > 54* 43* 37* 37* 36*  CREATININE 1.47*   < > 1.36*   < > 1.34* 1.09* 1.33* 1.34* 1.27*  CALCIUM 9.2   < > 9.3   < > 9.0 9.2 9.1 9.0 9.0  MG 1.9  --  2.0  --  2.2  --  2.5*  --  2.6*  PHOS 2.2*   < > 2.3*   < > 3.3 3.8 3.5 3.6 3.1   < > = values in this interval not displayed.    GFR: Estimated Creatinine Clearance: 50.9 mL/min (A) (by C-G formula based on SCr of 1.27 mg/dL (H)). Recent Labs  Lab 05/27/21 0455 05/28/21 0439 05/29/21 0422 05/30/21 0430  WBC 18.4* 13.7* 9.8 9.4     Liver Function Tests: Recent Labs  Lab 05/28/21 0439 05/28/21 1601 05/29/21 0422 05/29/21 1531 05/30/21 0430  ALBUMIN 2.9* 3.1* 3.1* 2.9* 2.7*    No results for input(s): LIPASE, AMYLASE in the last 168 hours. No results for input(s): AMMONIA in the last 168 hours.  ABG    Component Value Date/Time   PHART 7.40 05/14/2021 0341   PCO2ART 45 05/14/2021 0341   PO2ART 129 (H) 05/14/2021 0341   HCO3 27.9 05/14/2021 0341   ACIDBASEDEF 12.8 (H) 05/11/2021 0603   O2SAT 98.9 05/14/2021 0341     Coagulation Profile: Recent Labs  Lab 05/24/21 0335 05/28/21 1601  INR 1.1 1.2     Cardiac Enzymes: No results for input(s): CKTOTAL, CKMB, CKMBINDEX, TROPONINI in the last 168 hours.  HbA1C: Hgb A1c MFr Bld  Date/Time Value Ref Range Status  05/05/2021 04:53 AM 6.7 (H) 4.8 - 5.6 % Final    Comment:    (NOTE)         Prediabetes: 5.7 - 6.4         Diabetes: >6.4         Glycemic control for adults with diabetes: <7.0   02/11/2021 09:41 AM 6.3 (H) <5.7 % of total Hgb Final    Comment:    For someone without known diabetes, a hemoglobin  A1c value between 5.7% and 6.4% is consistent with prediabetes and should be confirmed with a  follow-up test. . For someone with known diabetes, a value <7% indicates that their diabetes is well controlled. A1c targets  should be individualized based on duration of diabetes,  age, comorbid conditions, and other considerations. . This assay result is consistent with an increased risk of diabetes. . Currently, no consensus exists regarding use of hemoglobin A1c for diagnosis of diabetes for children. .     CBG: Recent Labs  Lab 05/29/21 1530 05/29/21 1923 05/29/21 2354 05/30/21 0321 05/30/21 0714  GLUCAP 137* 160* 141* 175* 229*     Review of Systems:   Unable to assess pt mechanically ventilated via tracheostomy   Past Medical History  She,  has a past medical history of AKI (acute kidney injury) ([place]), Arthritis, Asthma, Bacteremia, COPD (chronic obstructive pulmonary disease) ([place]), Diabetes mellitus without complication ([place]), Endometriosis, GERD (gastroesophageal reflux disease), History of echocardiogram, Hypertension, Obesity, PAF (paroxysmal atrial fibrillation) ([place]), and Sleep apnea.   Surgical History    Past Surgical History:  Procedure Laterality Date   ABDOMINAL HYSTERECTOMY     APPLICATION OF WOUND VAC  05/10/2021   Procedure: APPLICATION OF WOUND VAC;  Surgeon: Herbert Pun, MD;  Location: [facility] ORS;  Service: General;;   APPLICATION OF WOUND VAC Left 05/17/2021   Procedure: APPLICATION OF WOUND VAC/WOUND VAC EXCHANGE-Matrix Myriad;  Surgeon: Herbert Pun, MD;  Location: [facility];  Service: General;  Laterality: Left;   APPLICATION OF WOUND VAC  05/24/2021   Procedure: APPLICATION OF WOUND VAC;  Surgeon: Herbert Pun, MD;  Location: [facility];  Service: General;;   COLONOSCOPY  10/29/2006   Dr Allen Norris   COLONOSCOPY WITH PROPOFOL N/A 11/05/2016   Procedure: COLONOSCOPY WITH PROPOFOL;  Surgeon: Robert Bellow, MD;  Location: [facility];  Service: Endoscopy;  Laterality: N/A;   INCISION AND DRAINAGE OF WOUND Left 05/10/2021   Procedure: IRRIGATION AND DEBRIDEMENT LEFT LEG;  Surgeon: Herbert Pun, MD;  Location: [facility];  Service: General;   Laterality: Left;   INCISION AND DRAINAGE OF WOUND Left 05/24/2021   Procedure: IRRIGATION AND DEBRIDEMENT LEFT LEG;  Surgeon: Herbert Pun, MD;  Location: [facility];  Service: General;  Laterality: Left;   IVC FILTER INSERTION N/A 05/29/2021   Procedure: IVC FILTER INSERTION;  Surgeon: Katha Cabal, MD;  Location: [facility];  Service: Cardiovascular;  Laterality: N/A;   MINOR GRAFT APPLICATION  7/85/8850   Procedure: Myriad Matrix  APPLICATION;  Surgeon: Herbert Pun, MD;  Location: [facility];  Service: General;;   NASAL SINUS SURGERY  2002   Dr Carlis Abbott   PEG PLACEMENT N/A 05/28/2021   Procedure: PERCUTANEOUS ENDOSCOPIC GASTROSTOMY (PEG) PLACEMENT;  Surgeon: Benjamine Sprague, DO;  Location: [facility];  Service: General;  Laterality: N/A;  TRAVEL CASE   TRACHEOSTOMY TUBE PLACEMENT N/A 05/17/2021   Procedure: TRACHEOSTOMY;  Surgeon: Clyde Canterbury, MD;  Location: [facility];  Service: ENT;  Laterality: N/A;   WOUND DEBRIDEMENT Left 05/07/2021   Procedure: DEBRIDEMENT WOUND;  Surgeon: Herbert Pun, MD;  Location: [facility];  Service: General;  Laterality: Left;     Social History   reports that she quit smoking about 20 years ago. Her smoking use included cigarettes. She has a 80.00 pack-year smoking history. She quit smokeless tobacco use about 20 years ago.  Her smokeless tobacco use included snuff. She reports that she does not drink alcohol and does not use drugs.   Family History   Her family history includes Alcohol abuse in her brother; Breast cancer (age of onset: 76) in her sister; Congestive Heart Failure in her mother; Coronary artery disease (age of onset: 40) in her father; Heart disease in her brother; Varicose Veins in her brother.  Allergies No Known Allergies   Home Medications  Prior to Admission medications   Medication Sig Start Date End Date Taking? Authorizing Provider  albuterol (PROVENTIL) (2.5 MG/3ML) 0.083% nebulizer solution Take 3  mLs (2.5 mg total) by nebulization every 6 (six) hours as needed for wheezing or shortness of breath. 10/03/19   Steele Sizer, MD  aspirin 81 MG tablet Take 1 tablet (81 mg total) by mouth daily. 01/08/16   Steele Sizer, MD  atorvastatin (LIPITOR) 40 MG tablet Take 1 tablet (40 mg total) by mouth daily. 02/11/21   Steele Sizer, MD  azelastine (ASTELIN) 0.1 % nasal spray Place 2 sprays into both nostrils 2 (two) times daily as needed for rhinitis or allergies. 07/31/20   Delsa Grana, PA-C  baclofen (LIORESAL) 10 MG tablet Take 10 mg by mouth daily. 04/16/21   [provider]  calcium carbonate (OSCAL) 1500 (600 Ca) MG TABS tablet Take 600 mg of elemental calcium by mouth 2 (two) times daily with a meal.    [provider]  celecoxib (CELEBREX) 200 MG capsule Take 200 mg by mouth daily. 04/03/21   [provider]  Cetirizine HCl (ZYRTEC ALLERGY) 10 MG TBDP Take 10 mg by mouth at bedtime. For sinus symptoms and seasonal allergies 07/31/20   Delsa Grana, PA-C  docusate sodium (COLACE) 100 MG capsule Take 100 mg by mouth daily.    [provider]  fluticasone Asencion Islam) 50 MCG/ACT nasal spray  08/31/20   [provider]  Fluticasone-Umeclidin-Vilant (TRELEGY ELLIPTA) 100-62.5-25 MCG/INH AEPB Inhale 1 puff into the lungs daily. In place of Stiolto 02/10/20   Steele Sizer, MD  glucose blood (ACCU-CHEK AVIVA PLUS) test strip Use as instructed 12/08/17   Steele Sizer, MD  Lancets (ACCU-CHEK SOFT TOUCH) lancets Use as instructed 12/08/17   Steele Sizer, MD  loratadine (CLARITIN) 10 MG tablet Take 1 tablet (10 mg total) by mouth daily. 02/11/21   Steele Sizer, MD  meloxicam (MOBIC) 15 MG tablet Take 15 mg by mouth daily. 04/23/21   [provider]  metFORMIN (GLUCOPHAGE-XR) 750 MG 24 hr tablet Take 1 tablet (750 mg total) by mouth daily with breakfast. 02/11/21   Ancil Boozer, Drue Stager, MD  montelukast (SINGULAIR) 10 MG tablet Take 1 tablet (10 mg  total) by mouth at bedtime. 02/11/21   Steele Sizer, MD  Multiple Vitamins-Minerals (MULTIVITAL PO) Take 1 tablet by mouth daily.    [provider]  omeprazole (PRILOSEC) 20 MG capsule Take 1 capsule (20 mg total) by mouth daily. 02/11/21   Steele Sizer, MD  [place] 30 MG/2ML SOSY  10/09/20   [provider]  triamterene-hydrochlorothiazide (DYAZIDE) 37.5-25 MG capsule TAKE 1 CAPSULE EVERY DAY 05/02/21   Steele Sizer, MD  vitamin B-12 (CYANOCOBALAMIN) 100 MCG tablet Take 100 mcg by mouth daily.    [provider]  Scheduled Meds:  amiodarone  200 mg Per Tube Daily   vitamin C  500 mg Per Tube BID   chlorhexidine gluconate (MEDLINE KIT)  15 mL Mouth Rinse BID   Chlorhexidine Gluconate Cloth  6 each Topical Q0600   Darbepoetin Alfa  100 mcg Subcutaneous Q7 days   escitalopram  10 mg Per Tube Daily   free water  30 mL Per Tube Q4H   gentamicin irrigation  80 mg Irrigation QID   hydrocortisone sod succinate (SOLU-CORTEF) inj  100 mg Intravenous Q12H   insulin aspart  0-20 Units Subcutaneous Q4H   insulin aspart  4 Units Subcutaneous Q4H   insulin detemir  17 Units Subcutaneous BID   magic mouthwash w/lidocaine  10 mL Oral QID   mouth rinse  15 mL Mouth Rinse 10 times per day   midodrine  10 mg Per Tube TID WC   multivitamin  1 tablet Per Tube QHS   nutrition supplement (JUVEN)  1 packet Per Tube BID BM   oxyCODONE  5 mg Per Tube Q6H   pantoprazole (PROTONIX) IV  40 mg Intravenous Q24H   sodium chloride flush  10-40 mL Intracatheter Q12H   vitamin A  10,000 Units Per Tube Daily   zinc sulfate  220 mg Per Tube Daily   Continuous Infusions:   prismasol BGK 4/2.5 400 mL/hr at 05/30/21 0819    prismasol BGK 4/2.5 400 mL/hr at 05/30/21 7824    ceFAZolin (ANCEF) IV Stopped (05/29/21 2349)   feeding supplement (PIVOT 1.5 CAL) 1,000 mL (05/29/21 1730)   norepinephrine (LEVOPHED) Adult infusion 8 mcg/min (05/30/21 0800)   piperacillin-tazobactam (ZOSYN)  IV  Stopped (05/30/21 0532)   prismasol BGK 4/2.5 1,000 mL/hr at 05/30/21 0701   PRN Meds:.acetaminophen, artificial tears, docusate, fentaNYL (SUBLIMAZE) injection, heparin, lip balm, midazolam, ondansetron (ZOFRAN) IV, oxyCODONE, sennosides, sodium chloride, sodium chloride flush  Assessment & Plan:   Acute Hypoxic and Hypercapnic Respiratory Failure s/p tracheostomy  ATELECTASIS, CHF PULMONARY EDEMA AND PLEURAL EFFUSIONS ALL PRESENT ON ADMISSION Hx: COPD, OSA, and Obesity - Trach collar trials as tolerated with mechanical ventilation qhs for now  - Wean Fio2 and PEEP as tolerated - VAP/VENT bundle implementation - Wean PEEP & FiO2 as tolerated, maintain SpO2 >88% - VAP protocol in place - Plateau pressures less than 30 cm H20  - Intermittent chest x-ray & ABG PRN - Ensure adequate pulmonary hygiene   - Per speech therapy trach will need to be downsized to size 6 when pt able to tolerate to be considered for PMV therapy    Septic shock Atrial fibrillation, now in NSR -Continuous cardiac monitoring -Maintain MAP >65 -Vasopressors as needed to maintain MAP goal -Continue scheduled midodrine   -Continue stress dose steroids due to concerns of adrenal insufficiency  -Continue amiodarone for now will hold for bradycardia   Severe Sepsis SOURCE-left necrotizing fascitis (strep pyogenes)-present on admission s/p multiple debridements, application of wound VAC system, s/p myriad matrix placement 05/17/2021 Streptococcal toxic shock syndrome Pseudomonas in Tracheal wound culture (? respiratory secretions coating the tracheostomy) -Monitor fever curve -Trend WBC's  -Follow cultures as above -Continue Zosyn as per ID (end date 05/31/21) -General Surgery following, appreciate input ~ wound care as per Surgery  Acute kidney injury secondary to ischemic nephropathy in the setting of hypotension complicated with sepsis  - Avoid nephrotoxic agents - Follow urine output, BMP - Ensure adequate  renal perfusion, optimize oxygenation - Renal dose medications - Nephrology consulted appreciate input~per recommendations continue CRRT with net even ultrafiltration    Shock liver   - Monitor hepatic function panel   - Monitor coags   Acute on chronic anemia~pt unable to tolerate chemical anticoagulation s/p IVC filter placement 05/29/2021 -Monitor for S/Sx of bleeding -Trend CBC -Unable to tolerate anticoagulants or SCD's for VTE Prophylaxis ~ IVC filter placed 2/15 -Transfuse for Hgb <7  Type II diabetes mellitus  -CBG's q4h; Target range of 140 to 180 -SSI -Follow ICU Hypo/Hyperglycemia protocol    Toxic metabolic encephalopathy~improving  Small SAH  NO ANTICOAGULATION FOR  1 MONTH AND THEN NEED TO RE-ASSESS NO ANTIPLATELET AGENTS FOR 1 MONTH AND THEN NEED TO RE-ASSESS CAN POSSIBLY CONSIDER  BABY ASA BUT WOULD NEED TO PROCEED WITH CAUTION  Best practice:  Diet: Tube Feeds  Pain/Anxiety/Delirium protocol (if indicated): prn fentanyl  VAP protocol (if indicated): Ordered  DVT prophylaxis: Unable to tolerate chemical prophylaxis s/p IVC filter placement  GI prophylaxis: PPI Glucose control:  SSI Yes Central venous access: Right IJ CVL; Left IJ Dual Lumen Dialysis Catheter Arterial line:  N/A Foley:  N/A Mobility:  bed rest  PT consulted: N/A Last date of multidisciplinary goals of care discussion [05/30/21] Code Status:  full code Disposition: ICU   = Goals of Care = Code Status Order: ICU  Primary Emergency ContactTAETUM, FLEWELLEN, Home Phone: [phone] Discussed plan of care with pts daughter Helane Rima   Critical care time: 63 minutes    Darel Hong, AGACNP-BC [facility] [place] epic messenger for cross cover needs If after hours, please call E-link

## 2021-05-30 NOTE — Consult Note (Signed)
PHARMACY CONSULT NOTE  Pharmacy Consult for Electrolyte Monitoring and Replacement   Recent Labs: Potassium (mmol/L)  Date Value  05/30/2021 4.5  08/11/2013 3.3 (L)   Magnesium (mg/dL)  Date Value  05/30/2021 2.6 (H)   Calcium (mg/dL)  Date Value  05/30/2021 9.0   Calcium, Total (mg/dL)  Date Value  08/11/2013 9.2   Albumin (g/dL)  Date Value  05/30/2021 2.7 (L)  08/11/2013 3.1 (L)   Phosphorus (mg/dL)  Date Value  05/30/2021 3.1   Sodium (mmol/L)  Date Value  05/30/2021 137  08/11/2013 138   Assessment: 71 yo female with a previous medical history of diabetes mellitus type 2, hypertension, obstructive sleep apnea on CPAP, asthma/COPD, GERD, history of COVID-19 pneumonia presenting with lower left extremity pain and chills. Patient found to have severe sepsis and strep pyogenes bacteremia on admission and is currently intubated and sedated. LLL necrotizing fascitis. [facility]  Pharmacy has been consulted to monitor and replace electrolytes.  -CRRT stopped 2/6, re-started (2/8>> -s/p trach 2/3, tolerating trach collar trials  Nutrition:  --Feeds per tube @65ml /hr --Free water per tube 30 mL Q4H  Goal of Therapy:  Electrolytes within normal limits  Plan:  Continues on CRRT No electrolyte replacement warranted for today q12h Renal function panels while on CRRT  Vallery Sa, PharmD, BCPS Clinical Pharmacist 05/30/2021 7:06 AM

## 2021-05-30 NOTE — Progress Notes (Signed)
Patient following simple commands. Attempted trach collar this afternoon but dropping down to mid 80's. CRRT continued through out shift. PT/OT into work with patient but refused. Patient refusing oral/lip care. Husband and daughter at bedside but unable to get patient to agree. Patient shakes head no and swats hands up. Tube feeds increased to 50 ml/hr. Rectal tube and wound vac intact. Gathered supplies for Dr. Peyton Najjar, will change left leg dressing tomorrow. Palliative care consulted in rounds this am. Continue to assess.

## 2021-05-30 NOTE — Progress Notes (Signed)
Duoneb order due to protocol assessment score of 10 and Rhonchi breath sounds

## 2021-05-30 NOTE — Significant Event (Signed)
At 0400 CRRT filter pressure shot up from 130 to over 500 suddenly. When cartridge was removed the thermax had very large clots in the blood warmer compartment. This caused having to change the cartridge without returning blood. CRRT restarted at 0445

## 2021-05-30 NOTE — Progress Notes (Signed)
Spoke with Aura Fey NP,regarding not returning blood back to patient due to clots in the CRRT filter. NP will check hemoglobin in the AM 2/17. No further orders given. Continue to assess.

## 2021-05-30 NOTE — Progress Notes (Signed)
PT Cancellation Note  Patient Details Name: Alexandra Foster MRN: [id number] DOB: 09/26/1950   Cancelled Treatment:    Reason Eval/Treat Not Completed: Other (comment). Patient shaking her head no at all PT attempts to initiate session (hand squeezing, removal of blankets). Did report pain and RN in room to assess patient. PT to re-attempt as able.   Lieutenant Diego PT, DPT 9:15 AM,05/30/21

## 2021-05-30 NOTE — Progress Notes (Signed)
This RT was called to pt room for desat to mid 80's while on TC trial. Pt placed back on resting settings at this time.

## 2021-05-30 NOTE — Progress Notes (Signed)
CRRT stopped and cartridge changed due to high filter pressures. Clots noted at the top of filter and unable to return blood. CRRT restarted.

## 2021-05-30 NOTE — Progress Notes (Signed)
Pt found on PRVC VT 460, RR 20, 40% +10 Peep. Pt tol setting well , will continue to monitor.

## 2021-05-30 NOTE — Progress Notes (Signed)
Occupational Therapy Treatment Patient Details Name: Alexandra Foster MRN: [id number] DOB: 07-Jun-1950 Today's Date: 05/30/2021   History of present illness 71 y/o female with history of Asthma/COPD, T2DM, HTN, OSA on CPAP, GERD, COVID-19 pneumonia, and Bilateral lower extremity edema who presented to the ED with chief complaint of LLE pain.  Cellulitis of LLE, septic shock with LLL necrotizing fascitis post op resp failure and severe septic shock with progressive renal failure on CRRT and progressive multiorgan failure.  Pt needing vent support on trach at night, HD, tube feeds. Pt is now s/p I&D of LLE & wound vac application on 7/61/95. Per secure chat with nephrologist it is okay to see pt while on CRRT. Surgeon also clears pt for participation with WBAT LLE.   OT comments  Pt seen for OT Tx this date to f/u re: safety with ADLs/ADL mobility. Pt continues to be largely resistive to therapy tasks. Difficult to discern cognitive status d/t trach. While she is able to seemingly appropriately say yes/no, she makes no attempts to mouth any words. She has various family members present this session which does seem to help some, but she continues to be resistive to oral care, AROM/AAROM/PROM. Family members including sister, spouse and daughter are educated on how to gently help facilitate ROM as tolerable for patient while she is bed level in the anterior and lateral planes. She demos improved movement of L UE versus evaluation date, but narrowly participates in active use when encouraged. She requires MOD/MAX A for oral care and is only really an active participant when trying to remove the oral swab although good oral care is a significant need as evidenced by oral secretions/sanguinous drainage hardened and so established that she is unable to part lips. (RN aware and pt has been refusing). Pt only minimally actively engaged in session. Will continue to attempt meaningful treatment sessions as able, but  mindful that a trial basis might be a more prudent approach to gauge the appropriateness of therapy given pt's general perceived lack of desire.    Recommendations for follow up therapy are one component of a multi-disciplinary discharge planning process, led by the attending physician.  Recommendations may be updated based on patient status, additional functional criteria and insurance authorization.    Follow Up Recommendations  OT at Long-term acute care hospital    Assistance Recommended at Discharge    Patient can return home with the following  Two people to help with bathing/dressing/bathroom;Two people to help with walking and/or transfers;Assistance with cooking/housework;Assist for transportation;Help with stairs or ramp for entrance;Assistance with feeding;Direct supervision/assist for financial management;Direct supervision/assist for medications management   Equipment Recommendations  Other (comment)    Recommendations for Other Services      Precautions / Restrictions Precautions Precautions: Fall Precaution Comments: L jugular temp dialysis catheter, wounds under most skin folds, LLE wound vac, ventilator via tracheostomy, rectal tube, catheter Restrictions Weight Bearing Restrictions: Yes LLE Weight Bearing: Weight bearing as tolerated       Mobility Bed Mobility                    Transfers                         Balance  ADL either performed or assessed with clinical judgement   ADL Overall ADL's : Needs assistance/impaired     Grooming: Oral care;Moderate assistance;Maximal assistance;Bed level Grooming Details (indicate cue type and reason): Pt continues to be very resistive to oral care, is only slightly more participatory today, reaching up to take swab out of her mouth when OT attempts to assist with cleaning.                                     Extremity/Trunk Assessment              Vision       Perception     Praxis      Cognition Arousal/Alertness: Lethargic Behavior During Therapy: Flat affect, Agitated Overall Cognitive Status: Difficult to assess                                 General Comments: She seemingly answers some yes/no appropriately, but essentially is resistive to most attempts at engaging pt in therapy session.        Exercises Other Exercises Other Exercises: OT attempts to engage pt in AAROM of UEs and oral care, pt very resistive to both tasks. Ed wtih pt and family re: gentle stretch of L LE to internally rotate to help prevent contracture as well as ed re: benefits of neutral positioning of neck as tolerable to prevent contracture.    Shoulder Instructions       General Comments      Pertinent Vitals/ Pain       Pain Assessment Pain Assessment: Faces Faces Pain Scale: Hurts whole lot Pain Location: mouth with oral care and cervical region with attempts to neutralize positioning (although tolerating repositioning better today) Pain Descriptors / Indicators: Grimacing, Discomfort Pain Intervention(s): Limited activity within patient's tolerance, Monitored during session, Repositioned  Home Living                                          Prior Functioning/Environment              Frequency  Min 2X/week        Progress Toward Goals  OT Goals(current goals can now be found in the care plan section)  Progress towards OT goals: OT to reassess next treatment  Acute Rehab OT Goals Patient Stated Goal: none stated OT Goal Formulation: Patient unable to participate in goal setting Time For Goal Achievement: 06/08/21 Potential to Achieve Goals: [place] Discharge plan remains appropriate    Co-evaluation                 AM-PAC OT "6 Clicks" Daily Activity     Outcome Measure   Help from another person eating meals?: A  Lot Help from another person taking care of personal grooming?: A Lot Help from another person toileting, which includes using toliet, bedpan, or urinal?: Total Help from another person bathing (including washing, rinsing, drying)?: Total Help from another person to put on and taking off regular upper body clothing?: Total Help from another person to put on and taking off regular lower body clothing?: Total 6 Click Score: 8    End of Session Equipment Utilized During Treatment: Other (comment) (on trach collar today)  OT  Visit Diagnosis: Other abnormalities of gait and mobility (R26.89);Muscle weakness (generalized) (M62.81);Pain Pain - Right/Left: Left Pain - part of body: Leg (and mouth)   Activity Tolerance Treatment limited secondary to medical complications (Comment)   Patient Left in bed;with call bell/phone within reach;with bed alarm set;with nursing/sitter in room;with family/visitor present (pt's sister present first time OT presents to room, then her spouse, then daughter.)   Nurse Communication          Time: [phone] (reason for some potential overlap is because OT re-presented to room in afternoon to completed education with family while working toward AAROM/PROM as tolerable with patient.) OT Time Calculation (min): 28 min  Charges: OT General Charges $OT Visit: 1 Visit OT Treatments $Self Care/Home Management : 8-22 mins $Therapeutic Activity: 8-22 mins  Gerrianne Scale, [place], OTR/L ascom [phone] 05/30/21, 8:05 PM

## 2021-05-30 NOTE — Progress Notes (Signed)
[facility]  PROGRESS NOTE   Subjective:   Patient remains critically ill, requiring ventilator support CRRT continued. UF goal of net even. UOP minimal. Blood flow rate 225-250 cc/min Norepinephrine gtt continued.  Rate is higher at 8  PRBC transfusion over the weekend and tuesday Underwent PEG and wound vac change on Tuesday Large clot was discovered in the blood warmer.  Dialyzer and tubing changed by nursing Cousin at bedside this morning  Objective:  Vital signs in last 24 hours:  Temp:  [97.3 F (36.3 C)-98.9 F (37.2 C)] 98.2 F (36.8 C) (02/16 0700) Pulse Rate:  [51-79] 66 (02/16 0930) Resp:  [16-23] 20 (02/16 0930) BP: (73-176)/(40-95) 124/58 (02/16 0930) SpO2:  [97 %-100 %] 97 % (02/16 0930) FiO2 (%):  [30 %] 30 % (02/16 0826) Weight:  [116.1 kg] 116.1 kg (02/16 0500)  Weight change: -1 kg Filed Weights   05/28/21 0455 05/29/21 0500 05/30/21 0500  Weight: 118.5 kg 117.1 kg 116.1 kg    Intake/Output: I/O last 3 completed shifts: In: 1006 [I.V.:193.6; Other:30; NG/GT:560; IV Piggyback:222.4] Out: 30 [Drains:235; DUKGU:542; Stool:95]   Intake/Output this shift:  Total I/O In: 94.3 [I.V.:14.3; NG/GT:80] Out: 120 [Other:120]  Physical Exam: General:  Critically ill  Head:  Combs/AT  Eyes:  Anicteric  Neck:  Trachea collar, tracheostomy, vent assisted  Lungs:   diminished bilaterally,    Heart:  regular,  Abdomen:   Soft, nontender  Extremities:  Left lower extremity wound vac  Neurologic: Sedated, able to follow simple commands  Skin:  No lesions  Access: Left IJ temp HD catheter    Basic Metabolic Panel: Recent Labs  Lab 05/26/21 0514 05/26/21 1358 05/27/21 0455 05/27/21 1551 05/28/21 0439 05/28/21 1601 05/29/21 0422 05/29/21 1531 05/30/21 0430  NA 132*   < > 136   < > 136 136 137 137 137  K 3.6   < > 3.6   < > 3.8 3.8 4.2 4.3 4.5  CL 98   < > 99   < > 104 102 105 104 104  CO2 25   < > 26   < > '25 25 26 25 26  ' GLUCOSE 214*    < > 256*   < > 135* 131* 143* 147* 209*  BUN 51*   < > 47*   < > 54* 43* 37* 37* 36*  CREATININE 1.47*   < > 1.36*   < > 1.34* 1.09* 1.33* 1.34* 1.27*  CALCIUM 9.2   < > 9.3   < > 9.0 9.2 9.1 9.0 9.0  MG 1.9  --  2.0  --  2.2  --  2.5*  --  2.6*  PHOS 2.2*   < > 2.3*   < > 3.3 3.8 3.5 3.6 3.1   < > = values in this interval not displayed.     CBC: Recent Labs  Lab 05/26/21 0514 05/26/21 1215 05/27/21 0455 05/28/21 0439 05/28/21 1127 05/29/21 0422 05/30/21 0430  WBC 20.8*  --  18.4* 13.7*  --  9.8 9.4  NEUTROABS 19.1*  --  16.5* 12.2*  --  8.8* 8.7*  HGB 5.9*   < > 7.5* 6.8* 7.6* 8.4* 8.7*  HCT 19.6*   < > 24.2* 22.1* 24.8* 27.3* 28.1*  MCV 94.7  --  94.5 97.4  --  94.1 94.6  PLT 221  --  148* 183  --  184 199   < > = values in this interval not displayed.  Urinalysis: No results for input(s): COLORURINE, LABSPEC, PHURINE, GLUCOSEU, HGBUR, BILIRUBINUR, KETONESUR, PROTEINUR, UROBILINOGEN, NITRITE, LEUKOCYTESUR in the last 72 hours.  Invalid input(s): APPERANCEUR    Imaging: PERIPHERAL VASCULAR CATHETERIZATION  Result Date: 05/29/2021 See surgical note for result.    Medications:     prismasol BGK 4/2.5 400 mL/hr at 05/30/21 0819    prismasol BGK 4/2.5 400 mL/hr at 05/30/21 0819    ceFAZolin (ANCEF) IV Stopped (05/29/21 2349)   feeding supplement (PIVOT 1.5 CAL) 1,000 mL (05/29/21 1730)   norepinephrine (LEVOPHED) Adult infusion 8 mcg/min (05/30/21 0900)   piperacillin-tazobactam (ZOSYN)  IV 3.375 g (05/30/21 0901)   prismasol BGK 4/2.5 1,000 mL/hr at 05/30/21 0701    amiodarone  200 mg Per Tube Daily   vitamin C  500 mg Per Tube BID   chlorhexidine gluconate (MEDLINE KIT)  15 mL Mouth Rinse BID   Chlorhexidine Gluconate Cloth  6 each Topical Q0600   Darbepoetin Alfa  100 mcg Subcutaneous Q7 days   escitalopram  10 mg Per Tube Daily   free water  30 mL Per Tube Q4H   gentamicin irrigation  80 mg Irrigation QID   hydrocortisone sod succinate (SOLU-CORTEF)  inj  100 mg Intravenous Q12H   insulin aspart  0-20 Units Subcutaneous Q4H   insulin aspart  4 Units Subcutaneous Q4H   insulin detemir  17 Units Subcutaneous BID   ipratropium-albuterol  3 mL Nebulization Q6H   magic mouthwash w/lidocaine  10 mL Oral QID   mouth rinse  15 mL Mouth Rinse 10 times per day   midodrine  10 mg Per Tube TID WC   multivitamin  1 tablet Per Tube QHS   nutrition supplement (JUVEN)  1 packet Per Tube BID BM   oxyCODONE  5 mg Per Tube Q6H   pantoprazole (PROTONIX) IV  40 mg Intravenous Q24H   sodium chloride flush  10-40 mL Intracatheter Q12H   vitamin A  10,000 Units Per Tube Daily   zinc sulfate  220 mg Per Tube Daily    Assessment/ Plan:     Principal Problem:   Severe sepsis with septic shock (CODE) (HCC) Active Problems:   COPD (chronic obstructive pulmonary disease) (HCC)   Morbid obesity (HCC)   Diabetes mellitus type 2 in obese (HCC)   Atrial fibrillation and flutter ([place])   Necrotizing fasciitis ([place])   Acute on chronic respiratory failure ([place])   On mechanically assisted ventilation (HCC)   Shock liver   Anemia associated with acute blood loss   Metabolic acidosis   Encounter for continuous renal replacement therapy (CRRT) for acute renal failure (HCC)   Acute encephalopathy   Group A streptococcal infection   Streptococcal toxic shock syndrome ([place])   Status post tracheostomy ([place])   Subdural hemorrhage ([place])   Left leg cellulitis  Ms. Jandi Swiger Hellickson is a 71 y.o. black female with COPD/asthma, diabetes mellitus type II, hypertension, obstructive sleep apnea, paroxysmal atrial fibrillation, GERD, who presents to [facility] 05[facility]04/2021 for AKI (acute kidney injury) ([place]) [N17.9] Left leg cellulitis [L03.116] Severe sepsis ([place]) [A41.9, R65.20] Severe sepsis with lactic acidosis ([place]) [A41.9, R65.20, E87.20] Found to have necrotizing fascitis requiring multiple debridements by Dr. Peyton Najjar: 1/24. 1/27, 2/3 and 2/10.    #1: Acute kidney  injury: AKI is secondary to ischemic nephropathy secondary to hypotension complicated with sepsis. History of bland urine. Baseline creatinine of 0.98, GFR > 60 on 02/11/21.  -Anuric at present.  - Continue continuous renal replacement therapy. Net  even ultrafiltration.  - IV albumin supplementation - Continue on 4 K bath - too unstable for IHD today, re-evaluate tomorrow   #2: Sepsis and hypotension: necrotizing fasciitis for left lower extremity status post multiple debridement.  Requiring vasopressors: norepinephrine. On stress dose steroids and midodrine -Antibiotics as per ID and primary team  #3. Anemia with Acute kidney injury:  Lab Results  Component Value Date   HGB 8.7 (L) 05/30/2021    S/p blood transfusions this admission Aranesp weekly SQ (mon)  #4. Acute Respiratory Failure Ventilator assisted. Management as per ICU team    LOS: [place], MD [facility] 2/16/20239:55 AM

## 2021-05-30 NOTE — Consult Note (Addendum)
Consultation Note Date: 05/30/2021   Patient Name: Alexandra Foster  DOB: 1950/11/19  MRN: [id number]  Age / Sex: 71 y.o., female  PCP: Steele Sizer, MD Referring Physician: Flora Lipps, MD  Reason for Consultation: Establishing goals of care  HPI/Patient Profile: 71 y.o female with medical history significant of Asthma/COPD, T2DM, HTN, OSA on CPAP, GERD, COVID-19 pneumonia, and Bilateral lower extremity edema who presented to the ED with chief complaints of LLE pain and chills since Thursday.   Patient report onset of symptoms for the past several days with worsening pain, swelling, fevers and chills, poor po intake and difficulty walking since Thursday. Denies injury to extremity or hx of cellulitis.  Clinical Assessment and Goals of Care:  Per staff patient is refusing care. Patient is sitting in bed with CRRT and trach collar. She opens her eyes. Inquired if she wanted to continue current care and she nodded. Asked if I could talk to her daughter or family and she shook her head no. Nursing attempted to care for the patient and attempted mouth care where her mouth began to bleed and patient resisted care and shook her head fiercly trying to stop the mouth care. Asked if she wanted [place] to continue and confirmed she did not by shaking her head no. Asked if I could speak with her daughter if she does not want these interventions and she nodded yes.   Met with daughter. Daughter discusses that patient has been married 32 years and has 1 child, her. She states her mother has grandchildren and a great grandchild she loves to spend time with. She states she loves to cook and spend time with family.   She states her mother began feeling bad the week before admission and they called 911 when she developed the blisters.   We discussed her diagnoses, prognosis, GOC, EOL wishes disposition and options.  She  states she has spoke with her mother and feels she would want to keep going and trying to live. She states she affirmed her love for her mother and asked her mother if she loved her. She states she encouraged her to continue working to get well and ensured her that she would help her no matter what. She states she loves her mother and will do anything for her.   A detailed discussion was had today regarding advanced directives.  Concepts specific to code status, artifical feeding and hydration, IV antibiotics and rehospitalization were discussed.  The difference between an aggressive medical intervention path and a comfort care path was discussed.  Values and goals of care important to patient and family were attempted to be elicited.  Discussed limitations of medical interventions to prolong quality of life in some situations and discussed the concept of human mortality. Discussed family talking about any boundaries and acceptable QOL.   Discussed patient's resistance to allow care. Discussed talking about her wishes and autonomy with patient and family.  I answered daughter's questions about comfort care and end of life process.  SUMMARY OF RECOMMENDATIONS   Daughter voices being grateful for our conversation. She is aware I will be off service next week. Discussed having another member of PMT to follow up in my place; she states she does not want to speak with anyone else on our team. She states she will talk with CCM as needed.       Primary Diagnoses: Present on Admission:  Morbid obesity ([place])  COPD (chronic obstructive pulmonary disease) ([place])  Diabetes mellitus type 2 in obese ([place])   I have reviewed the medical record, interviewed the patient and family, and examined the patient. The following aspects are pertinent.  Past Medical History:  Diagnosis Date   AKI (acute kidney injury) ([place])    a. 04/2021 in setting of bacteremia/shock.   Arthritis    Asthma    Bacteremia     a. 04/2021 S pyogenes bacteremia in setting of lower ext cellulitis.   COPD (chronic obstructive pulmonary disease) ([facility])    Diabetes mellitus without complication ([facility])    Endometriosis    GERD (gastroesophageal reflux disease)    History of echocardiogram    a. 07/2013 Echo: EF 55-60%, impaired relaxation, mild TR; b. 04/2021 Echo: EF 50-55%, mild LVH, nl RV fxn, mild BAE, Ao sclerosis w/o stenosis.   Hypertension    Obesity    PAF (paroxysmal atrial fibrillation) ([place])    a. 04/2021 in setting of septic shock/cellulitis.   Sleep apnea    CPAP   Social History   Socioeconomic History   Marital status: Married    Spouse name: Trilby Drummer   Number of children: 1   Years of education: 12   Highest education level: 12th grade  Occupational History    Employer: RETIRED  Tobacco Use   Smoking status: Former    Packs/day: 2.00    Years: 40.00    Pack years: 80.00    Types: Cigarettes    Quit date: 2003    Years since quitting: 20.1   Smokeless tobacco: Former    Types: Snuff    Quit date: 04/2001   Tobacco comments:    smoking cessation materials not required  Vaping Use   Vaping Use: Never used  Substance and Sexual Activity   Alcohol use: No    Alcohol/week: 0.0 standard drinks   Drug use: No   Sexual activity: Not Currently  Other Topics Concern   Not on file  Social History Narrative   Lives locally w/ husband.  Does not routinely exercise.   Social Determinants of Health   Financial Resource Strain: Medium Risk   Difficulty of Paying Living Expenses: Somewhat hard  Food Insecurity: No Food Insecurity   Worried About Charity fundraiser in the Last Year: Never true   Ran Out of Food in the Last Year: Never true  Transportation Needs: No Transportation Needs   Lack of Transportation (Medical): No   Lack of Transportation (Non-Medical): No  Physical Activity: Inactive   Days of Exercise per Week: 0 days   Minutes of Exercise per Session: 0 min  Stress: No Stress  Concern Present   Feeling of Stress : Not at all  Social Connections: Moderately Integrated   Frequency of Communication with Friends and Family: More than three times a week   Frequency of Social Gatherings with Friends and Family: More than three times a week   Attends Religious Services: More than 4 times per year   Active Member of Clubs or Organizations: No   Attends  Club or Organization Meetings: Never   Marital Status: Married   Family History  Problem Relation Age of Onset   Congestive Heart Failure Mother    Coronary artery disease Father 91   Breast cancer Sister 27   Heart disease Brother    Varicose Veins Brother    Alcohol abuse Brother    Scheduled Meds:  amiodarone  200 mg Per Tube Daily   vitamin C  500 mg Per Tube BID   chlorhexidine gluconate (MEDLINE KIT)  15 mL Mouth Rinse BID   Chlorhexidine Gluconate Cloth  6 each Topical Q0600   Darbepoetin Alfa  100 mcg Subcutaneous Q7 days   escitalopram  10 mg Per Tube Daily   free water  30 mL Per Tube Q4H   gentamicin irrigation  80 mg Irrigation QID   hydrocortisone sod succinate (SOLU-CORTEF) inj  100 mg Intravenous Q12H   insulin aspart  0-20 Units Subcutaneous Q4H   insulin aspart  4 Units Subcutaneous Q4H   insulin detemir  17 Units Subcutaneous BID   ipratropium-albuterol  3 mL Nebulization Q6H   magic mouthwash w/lidocaine  10 mL Oral QID   mouth rinse  15 mL Mouth Rinse 10 times per day   midodrine  10 mg Per Tube TID WC   multivitamin  1 tablet Per Tube QHS   nutrition supplement (JUVEN)  1 packet Per Tube BID BM   oxyCODONE  5 mg Per Tube Q6H   pantoprazole (PROTONIX) IV  40 mg Intravenous Q24H   sodium chloride flush  10-40 mL Intracatheter Q12H   vitamin A  10,000 Units Per Tube Daily   zinc sulfate  220 mg Per Tube Daily   Continuous Infusions:   prismasol BGK 4/2.5 400 mL/hr at 05/30/21 0819    prismasol BGK 4/2.5 400 mL/hr at 05/30/21 7893    ceFAZolin (ANCEF) IV Stopped (05/29/21 2349)    feeding supplement (PIVOT 1.5 CAL) 1,000 mL (05/30/21 1050)   norepinephrine (LEVOPHED) Adult infusion 10 mcg/min (05/30/21 1504)   piperacillin-tazobactam (ZOSYN)  IV Stopped (05/30/21 1301)   prismasol BGK 4/2.5 1,000 mL/hr at 05/30/21 0701   PRN Meds:.acetaminophen, albuterol, artificial tears, docusate, fentaNYL (SUBLIMAZE) injection, heparin, lip balm, midazolam, ondansetron (ZOFRAN) IV, oxyCODONE, sennosides, sodium chloride, sodium chloride flush Medications Prior to Admission:  Prior to Admission medications   Medication Sig Start Date End Date Taking? Authorizing Provider  albuterol (PROVENTIL) (2.5 MG/3ML) 0.083% nebulizer solution Take 3 mLs (2.5 mg total) by nebulization every 6 (six) hours as needed for wheezing or shortness of breath. 10/03/19  Yes Sowles, Drue Stager, MD  aspirin 81 MG tablet Take 1 tablet (81 mg total) by mouth daily. 01/08/16  Yes Sowles, Drue Stager, MD  atorvastatin (LIPITOR) 40 MG tablet Take 1 tablet (40 mg total) by mouth daily. 02/11/21  Yes Sowles, Drue Stager, MD  azelastine (ASTELIN) 0.1 % nasal spray Place 2 sprays into both nostrils 2 (two) times daily as needed for rhinitis or allergies. 07/31/20  Yes Delsa Grana, PA-C  baclofen (LIORESAL) 10 MG tablet Take 10 mg by mouth daily. 04/16/21  Yes [provider]  celecoxib (CELEBREX) 200 MG capsule Take 200 mg by mouth daily. 04/03/21  Yes [provider]  Cetirizine HCl (ZYRTEC ALLERGY) 10 MG TBDP Take 10 mg by mouth at bedtime. For sinus symptoms and seasonal allergies 07/31/20  Yes Delsa Grana, PA-C  docusate sodium (COLACE) 100 MG capsule Take 100 mg by mouth daily.   Yes [provider]  fluticasone (FLONASE) 50  MCG/ACT nasal spray  08/31/20  Yes [provider]  glucose blood (ACCU-CHEK AVIVA PLUS) test strip Use as instructed 12/08/17  Yes Sowles, Drue Stager, MD  Lancets (ACCU-CHEK SOFT TOUCH) lancets Use as instructed 12/08/17  Yes Sowles, Drue Stager, MD  meloxicam (MOBIC) 15 MG tablet  Take 15 mg by mouth daily. 04/23/21  Yes [provider]  metFORMIN (GLUCOPHAGE-XR) 750 MG 24 hr tablet Take 1 tablet (750 mg total) by mouth daily with breakfast. 02/11/21  Yes Sowles, Drue Stager, MD  montelukast (SINGULAIR) 10 MG tablet Take 1 tablet (10 mg total) by mouth at bedtime. 02/11/21  Yes Sowles, Drue Stager, MD  Multiple Vitamins-Minerals (MULTIVITAL PO) Take 1 tablet by mouth daily.   Yes [provider]  omeprazole (PRILOSEC) 20 MG capsule Take 1 capsule (20 mg total) by mouth daily. 02/11/21  Yes Steele Sizer, MD  [place] 30 MG/2ML SOSY  10/09/20  Yes [provider]  triamterene-hydrochlorothiazide (DYAZIDE) 37.5-25 MG capsule TAKE 1 CAPSULE EVERY DAY 05/02/21  Yes Sowles, Drue Stager, MD  vitamin B-12 (CYANOCOBALAMIN) 100 MCG tablet Take 100 mcg by mouth daily.   Yes [provider]  calcium carbonate (OSCAL) 1500 (600 Ca) MG TABS tablet Take 600 mg of elemental calcium by mouth 2 (two) times daily with a meal. Patient not taking: Reported on 05/05/2021    [provider]  Fluticasone-Umeclidin-Vilant (TRELEGY ELLIPTA) 100-62.5-25 MCG/INH AEPB Inhale 1 puff into the lungs daily. In place of Stiolto 02/10/20   Steele Sizer, MD  loratadine (CLARITIN) 10 MG tablet Take 1 tablet (10 mg total) by mouth daily. 02/11/21   Steele Sizer, MD   No Known Allergies Review of Systems  Unable to perform ROS  Physical Exam Pulmonary:     Effort: Pulmonary effort is normal.  Neurological:     Mental Status: She is alert.    Vital Signs: BP (!) 113/51    Pulse 70    Temp 98.7 F (37.1 C)    Resp (!) 24    Ht 5' 5" (1.651 m)    Wt 116.1 kg    SpO2 100%    BMI 42.59 kg/m  Pain Scale: CPOT POSS *See Group Information*: S-Acceptable,Sleep, easy to arouse Pain Score: Asleep   SpO2: SpO2: 100 % O2 Device:SpO2: 100 % O2 Flow Rate: .O2 Flow Rate (L/min): 5 L/min  IO: Intake/output summary:  Intake/Output Summary (Last 24 hours) at 05/30/2021  1642 Last data filed at 05/30/2021 1504 Gross per 24 hour  Intake 1705.88 ml  Output 1583 ml  Net 122.88 ml    LBM: Last BM Date : 05/29/21 Baseline Weight: Weight: 120.7 kg Most recent weight: Weight: 116.1 kg        Signed by: Asencion Gowda, NP   Please contact Palliative Medicine Team phone at [phone] for questions and concerns.  For individual provider: See Shea Evans

## 2021-05-31 ENCOUNTER — Inpatient Hospital Stay: Payer: Medicare HMO

## 2021-05-31 ENCOUNTER — Encounter: Payer: Self-pay | Admitting: General Surgery

## 2021-05-31 DIAGNOSIS — J9602 Acute respiratory failure with hypercapnia: Secondary | ICD-10-CM | POA: Diagnosis not present

## 2021-05-31 DIAGNOSIS — D62 Acute posthemorrhagic anemia: Secondary | ICD-10-CM | POA: Diagnosis not present

## 2021-05-31 DIAGNOSIS — J9621 Acute and chronic respiratory failure with hypoxia: Secondary | ICD-10-CM | POA: Diagnosis not present

## 2021-05-31 DIAGNOSIS — R6521 Severe sepsis with septic shock: Secondary | ICD-10-CM | POA: Diagnosis not present

## 2021-05-31 DIAGNOSIS — J9601 Acute respiratory failure with hypoxia: Secondary | ICD-10-CM | POA: Diagnosis not present

## 2021-05-31 DIAGNOSIS — G9340 Encephalopathy, unspecified: Secondary | ICD-10-CM | POA: Diagnosis not present

## 2021-05-31 LAB — RENAL FUNCTION PANEL
Albumin: 2.4 g/dL — ABNORMAL LOW (ref 3.5–5.0)
Albumin: 2.2 g/dL — ABNORMAL LOW (ref 3.5–5.0)
Anion gap: 7 (ref 5–15)
Anion gap: 10 (ref 5–15)
BUN: 40 mg/dL — ABNORMAL HIGH (ref 8–23)
BUN: 47 mg/dL — ABNORMAL HIGH (ref 8–23)
CO2: 26 mmol/L (ref 22–32)
CO2: 24 mmol/L (ref 22–32)
Calcium: 8.4 mg/dL — ABNORMAL LOW (ref 8.9–10.3)
Calcium: 9.2 mg/dL (ref 8.9–10.3)
Chloride: 104 mmol/L (ref 98–111)
Chloride: 102 mmol/L (ref 98–111)
Creatinine, Ser: 1.45 mg/dL — ABNORMAL HIGH (ref 0.44–1.00)
Creatinine, Ser: 1.32 mg/dL — ABNORMAL HIGH (ref 0.44–1.00)
GFR, Estimated: 38 mL/min — ABNORMAL LOW (ref >60)
GFR, Estimated: 43 mL/min — ABNORMAL LOW (ref >60)
Glucose, Bld: 301 mg/dL — ABNORMAL HIGH (ref 70–99)
Glucose, Bld: 235 mg/dL — ABNORMAL HIGH (ref 70–99)
Phosphorus: 2.9 mg/dL (ref 2.5–4.6)
Phosphorus: 2.8 mg/dL (ref 2.5–4.6)
Potassium: 5.2 mmol/L — ABNORMAL HIGH (ref 3.5–5.1)
Potassium: 4.8 mmol/L (ref 3.5–5.1)
Sodium: 137 mmol/L (ref 135–145)
Sodium: 136 mmol/L (ref 135–145)

## 2021-05-31 LAB — CBC WITH DIFFERENTIAL/PLATELET
Abs Immature Granulocytes: 0.05 10*3/uL (ref 0.00–0.07)
Basophils Absolute: 0 10*3/uL (ref 0.0–0.1)
Basophils Relative: 0 %
Eosinophils Absolute: 0 10*3/uL (ref 0.0–0.5)
Eosinophils Relative: 0 %
HCT: 27 % — ABNORMAL LOW (ref 36.0–46.0)
Hemoglobin: 8 g/dL — ABNORMAL LOW (ref 12.0–15.0)
Immature Granulocytes: 1 %
Lymphocytes Relative: 3 %
Lymphs Abs: 0.3 10*3/uL — ABNORMAL LOW (ref 0.7–4.0)
MCH: 28.7 pg (ref 26.0–34.0)
MCHC: 29.6 g/dL — ABNORMAL LOW (ref 30.0–36.0)
MCV: 96.8 fL (ref 80.0–100.0)
Monocytes Absolute: 0.5 10*3/uL (ref 0.1–1.0)
Monocytes Relative: 5 %
Neutro Abs: 9.9 10*3/uL — ABNORMAL HIGH (ref 1.7–7.7)
Neutrophils Relative %: 91 %
Platelets: 173 10*3/uL (ref 150–400)
RBC: 2.79 MIL/uL — ABNORMAL LOW (ref 3.87–5.11)
RDW: 19.7 % — ABNORMAL HIGH (ref 11.5–15.5)
WBC: 10.8 10*3/uL — ABNORMAL HIGH (ref 4.0–10.5)
nRBC: 0.9 % — ABNORMAL HIGH (ref 0.0–0.2)

## 2021-05-31 LAB — GLUCOSE, CAPILLARY
Glucose-Capillary: 277 mg/dL — ABNORMAL HIGH (ref 70–99)
Glucose-Capillary: 278 mg/dL — ABNORMAL HIGH (ref 70–99)
Glucose-Capillary: 153 mg/dL — ABNORMAL HIGH (ref 70–99)
Glucose-Capillary: 200 mg/dL — ABNORMAL HIGH (ref 70–99)
Glucose-Capillary: 192 mg/dL — ABNORMAL HIGH (ref 70–99)
Glucose-Capillary: 221 mg/dL — ABNORMAL HIGH (ref 70–99)

## 2021-05-31 LAB — HEPATIC FUNCTION PANEL
ALT: 104 U/L — ABNORMAL HIGH (ref 0–44)
AST: 75 U/L — ABNORMAL HIGH (ref 15–41)
Albumin: 2.5 g/dL — ABNORMAL LOW (ref 3.5–5.0)
Alkaline Phosphatase: 206 U/L — ABNORMAL HIGH (ref 38–126)
Bilirubin, Direct: 0.7 mg/dL — ABNORMAL HIGH (ref 0.0–0.2)
Indirect Bilirubin: 0.9 mg/dL (ref 0.3–0.9)
Total Bilirubin: 1.6 mg/dL — ABNORMAL HIGH (ref 0.3–1.2)
Total Protein: 5.8 g/dL — ABNORMAL LOW (ref 6.5–8.1)

## 2021-05-31 LAB — VITAMIN C: Vitamin C: 2 mg/dL (ref 0.4–2.0)

## 2021-05-31 LAB — MAGNESIUM: Magnesium: 2.5 mg/dL — ABNORMAL HIGH (ref 1.7–2.4)

## 2021-05-31 MED ORDER — HYDROMORPHONE HCL 1 MG/ML IJ SOLN
1.0000 mg | INTRAMUSCULAR | Status: DC | PRN
  Administered 2021-05-31 – 2021-06-13 (×16): 1 mg via INTRAVENOUS
  Filled 2021-05-31 (×19): qty 1

## 2021-05-31 MED ORDER — MIDAZOLAM HCL 2 MG/2ML IJ SOLN
2.0000 mg | Freq: Once | INTRAMUSCULAR | Status: AC
  Administered 2021-05-31: 2 mg via INTRAVENOUS
  Filled 2021-05-31: qty 2

## 2021-05-31 MED ORDER — NOREPINEPHRINE 16 MG/250ML-% IV SOLN
0.0000 ug/min | INTRAVENOUS | Status: DC
  Administered 2021-05-31: 8.96 ug/min via INTRAVENOUS
  Administered 2021-06-01: 10 ug/min via INTRAVENOUS
  Filled 2021-05-31 (×3): qty 250

## 2021-05-31 MED ORDER — ALBUMIN HUMAN 25 % IV SOLN
25.0000 g | Freq: Four times a day (QID) | INTRAVENOUS | Status: AC
  Administered 2021-05-31 – 2021-06-01 (×4): 25 g via INTRAVENOUS
  Filled 2021-05-31 (×4): qty 100

## 2021-05-31 MED ORDER — INSULIN ASPART 100 UNIT/ML IJ SOLN
6.0000 [IU] | INTRAMUSCULAR | Status: DC
  Administered 2021-05-31 – 2021-06-01 (×6): 6 [IU] via SUBCUTANEOUS
  Filled 2021-05-31 (×6): qty 1

## 2021-05-31 MED ORDER — INSULIN DETEMIR 100 UNIT/ML ~~LOC~~ SOLN
23.0000 [IU] | Freq: Two times a day (BID) | SUBCUTANEOUS | Status: DC
  Administered 2021-05-31 – 2021-06-12 (×19): 23 [IU] via SUBCUTANEOUS
  Filled 2021-05-31 (×25): qty 0.23

## 2021-05-31 MED ORDER — PRISMASOL BGK 0/2.5 32-2.5 MEQ/L EC SOLN
Status: DC
  Filled 2021-05-31 (×8): qty 5000

## 2021-05-31 MED ORDER — ALBUMIN HUMAN 25 % IV SOLN
25.0000 g | Freq: Every day | INTRAVENOUS | Status: DC
  Administered 2021-06-02 – 2021-06-05 (×4): 25 g via INTRAVENOUS
  Filled 2021-05-31 (×4): qty 100

## 2021-05-31 MED ORDER — PRISMASOL BGK 0/2.5 32-2.5 MEQ/L EC SOLN
Status: DC
  Filled 2021-05-31 (×16): qty 5000

## 2021-05-31 MED ORDER — PRISMASOL BGK 0/2.5 32-2.5 MEQ/L REPLACEMENT SOLN
Status: DC
Start: 2021-05-31 — End: 2021-06-04
  Filled 2021-05-31 (×23): qty 5000

## 2021-05-31 MED ORDER — PRISMASOL BGK 0/2.5 32-2.5 MEQ/L EC SOLN
Status: DC
  Filled 2021-05-31 (×5): qty 5000

## 2021-05-31 MED ORDER — NOREPINEPHRINE BITARTRATE 1 MG/ML IV SOLN
0.0000 ug/min | INTRAVENOUS | Status: DC
  Administered 2021-05-31: 9 ug/min via INTRAVENOUS
  Filled 2021-05-31: qty 16

## 2021-05-31 NOTE — Progress Notes (Signed)
Heparin removed, CRRT restarted and Pt BP tolerating well, able to go down on levo from 11 to 9. Pt in sinus tach at 108, and still refusing mouthcare, will shake her head away and swat her hands towards staff.

## 2021-05-31 NOTE — Consult Note (Signed)
PHARMACY CONSULT NOTE  Pharmacy Consult for Electrolyte Monitoring and Replacement   Recent Labs: Potassium (mmol/L)  Date Value  05/31/2021 5.2 (H)  08/11/2013 3.3 (L)   Magnesium (mg/dL)  Date Value  05/31/2021 2.5 (H)   Calcium (mg/dL)  Date Value  05/31/2021 8.4 (L)   Calcium, Total (mg/dL)  Date Value  08/11/2013 9.2   Albumin (g/dL)  Date Value  05/31/2021 2.4 (L)  05/31/2021 2.5 (L)  08/11/2013 3.1 (L)   Phosphorus (mg/dL)  Date Value  05/31/2021 2.9   Sodium (mmol/L)  Date Value  05/31/2021 137  08/11/2013 138   Assessment: 71 yo female with a previous medical history of diabetes mellitus type 2, hypertension, obstructive sleep apnea on CPAP, asthma/COPD, GERD, history of COVID-19 pneumonia presenting with lower left extremity pain and chills. Patient found to have severe sepsis and strep pyogenes bacteremia on admission and is currently intubated and sedated. LLL necrotizing fascitis. [facility]  Pharmacy has been consulted to monitor and replace electrolytes.  -CRRT stopped 2/6, re-started (2/8>> -s/p trach 2/3, tolerating trach collar trials  Nutrition:  --Feeds per tube @65ml /hr --Free water per tube 30 mL Q4H  Goal of Therapy:  Electrolytes within normal limits  Plan:  Continues on CRRT No electrolyte replacement warranted for today q12h Renal function panels while on CRRT  Vallery Sa, PharmD, BCPS Clinical Pharmacist 05/31/2021 6:59 AM

## 2021-05-31 NOTE — Progress Notes (Signed)
Pt's husband updated at bedside.  All questions answered to his satisfaction. Appreciative of update.    Darel Hong, AGACNP-BC [facility] Prefer epic messenger for cross cover needs If after hours, please call E-link

## 2021-05-31 NOTE — Progress Notes (Signed)
ID Patient seems to be depressed Is not interested in any care Has been swatting at the hands of staff and nodding head no.  Refusing to open her mouth for mild CAD I did speak with her and it looks like she is not interested in medical care. Palliative also spoke with her yesterday  BP (!) 100/56 (BP Location: Right Arm)    Pulse 92    Temp 98.9 F (37.2 C) (Axillary)    Resp 19    Ht '5\' 5"'  (1.651 m)    Wt 111.7 kg    SpO2 100%    BMI 40.98 kg/m   Tracheostomy Chest bilateral air entry Heart sounds S1-S2 tachycardia Abdomen soft PEG in place Bilateral IJ lines On CRRT Left leg wound VAC  Labs CBC Latest Ref Rng & Units 05/31/2021 05/30/2021 05/29/2021  WBC 4.0 - 10.5 K/uL 10.8(H) 9.4 9.8  Hemoglobin 12.0 - 15.0 g/dL 8.0(L) 8.7(L) 8.4(L)  Hematocrit 36.0 - 46.0 % 27.0(L) 28.1(L) 27.3(L)  Platelets 150 - 400 K/uL 173 199 184     CMP Latest Ref Rng & Units 05/31/2021 05/31/2021 05/30/2021  Glucose 70 - 99 mg/dL 235(H) 301(H) 233(H)  BUN 8 - 23 mg/dL 47(H) 40(H) 32(H)  Creatinine 0.44 - 1.00 mg/dL 1.32(H) 1.45(H) 1.14(H)  Sodium 135 - 145 mmol/L 136 137 136  Potassium 3.5 - 5.1 mmol/L 4.8 5.2(H) 4.2  Chloride 98 - 111 mmol/L 102 104 108  CO2 22 - 32 mmol/L '24 26 23  ' Calcium 8.9 - 10.3 mg/dL 9.2 8.4(L) 7.8(L)  Total Protein 6.5 - 8.1 g/dL - 5.8(L) -  Total Bilirubin 0.3 - 1.2 mg/dL - 1.6(H) -  Alkaline Phos 38 - 126 U/L - 206(H) -  AST 15 - 41 U/L - 75(H) -  ALT 0 - 44 U/L - 104(H) -     MICRO Microbiology: [facility] 05/04/21- GAS 05/07/21- Wound culture NG 05/09/21 BC -NG Neck wound/tracheal aspiration with Pseudomonas   Impression/recommendation  Group A streptococcus bacteremia with septic shock and multiorgan failure.  She has received 3 weeks of IV antibiotics as well as IVIG to 3 doses Necrotizing infection of the left leg.  She had debridement needed to She has a wound VAC on the knee which she has a myriad  matrix  Encephalopathy is improved a lot and patient is more  alert But she is depressed and is not wanting care now  /Seizures on Keppra  Shock liver: With abnormal LFTs.  Much improved Pseudomonas tracheal wound infection versus Pseudomonas pneumonia Patient is on Zosyn and leukocytosis is resolved.  Today is day 10 of Zosyn and it can be discontinued after this.  Acute hypoxic respiratory failure.  Patient now has tracheostomy.  AKI on CRRT  PEG in place  IVC filter in place  ID will sign off as there is no active infectious issue currently.  Call if needed.

## 2021-05-31 NOTE — Progress Notes (Signed)
OT Cancellation Note  Patient Details Name: Alexandra Foster MRN: [id number] DOB: Oct 05, 1950   Cancelled Treatment:    Reason Eval/Treat Not Completed: Patient declined, no reason specified Unable to work with pt this date as she adamantly refuses, batting therapist away with attempts to even attempt ROM. Her spouse is present and offers gentle encouragement, but pt continues to adamantly refuse treatment. Will continue to follow acutely as able, but now that ROM education has been completed with family, should pt refuse to actively participate 2 more times, will sign off per rehab department policy. Pt's spouse made aware of policy and verbalized understanding. Thank you.  Gerrianne Scale, [place], OTR/L ascom [phone] 05/31/21, 12:12 PM

## 2021-05-31 NOTE — Progress Notes (Signed)
NAME:  Alexandra Foster, MRN:  [id number], DOB:  [birth year], LOS: 35 ADMISSION DATE:  05/04/2021, CONSULTATION DATE:  05/04/2021 REFERRING MD:  Duffy Bruce MD CHIEF COMPLAINT:  Left leg pain   HPI  71 y.o female with medical history significant of Asthma/COPD, T2DM, HTN, OSA on CPAP, GERD, COVID-19 pneumonia, and Bilateral lower extremity edema who presented to the ED with chief complaints of LLE pain and chills since Thursday.  Patient report onset of symptoms for the past several days with worsening pain, swelling, fevers and chills, poor po intake and difficulty walking since Thursday. Denies injury to extremity or hx of cellulitis.  ED Course: In the emergency department, the temperature was 37.7C, the heart rate 103 beats/minute, the blood pressure 75/64  mm Hg, the respiratory rate 27 breaths/minute, and the oxygen saturation 99% on RA. CT of left lower leg showed diffuse subcutaneous edema concerning for cellulitis with no fluid collection/abscess. Pertinent Labs in Red/Diagnostics Findings: Na+/ K+: 141/2.9 Glucose: 144 BUN/Cr.: 44/3.47 AST/ALT:67/48   WBC/ TMAX: 16.5/ afebrile PCT: pending Lactic acid: 3.8>6.0 COVID PCR: Negative  Patient given 30 cc/kg of fluids and started on broad-spectrum antibiotics for sepsis with septic shock. Patient remained hypotensive despite IVF boluses therefore was started on Levophed. PCCM consulted.  Past Medical History    Arthritis   Asthma   COPD (chronic obstructive pulmonary disease) ([place])   Diabetes mellitus without complication ([place])   Endometriosis   GERD (gastroesophageal reflux disease)   Hypertension   Obesity   Sleep apnea    CPAP   Significant Hospital Events   1/21: Admitted to the ICU with severe sepsis with shock secondary to cellulitis of LLE 1/23: off pressors +Afib with RVR 1/24: OR for fasciotomy 1/25: post op resp failure with severe septic shock 1/26: remains on pressors 1/27: returns to the OR today for  debridement, wound VAC 1/28: overnight with hemodynamic deterioration, required reintubation, currently on pressors 1/29: remains on 3 pressors, CRRT, wound VAC and minimal sedation.  Awakens to voice and touch.  Seen by vascular surgery and awaiting input from general surgery regarding amputation of left leg 2/1: remains on pressors, on CRRT, MRI/CT shows SAH, afib with RVR, ECHO pending, restarted AMIO at 60 mg/hr 2/3: remains on crrt, remains on vent, plan for [facility] and wound vac change in OR 2/3: S/P TRACH, omplantation of cellular matrix on left leg 2/4: REMAINS ON PRESSORS, REMAINS ON VENT, REMAINS ON CRRT 2/5: REMAINS ON VENT, OFF PRESSORS, REMAINS ON CRRT 2/6: Patient is improved she is able to follow communication verbally and move extermities. She is on 5L/min Trache collar. We have stopped levophed and working on reducing vasopressin. She remains on CRRT.  Insulin infusion is ongoing and plan to have weight based regimen initiated. Additionally she will have SLP for vocalization and PMV trial as well as ROM training with OT/PT.  Multiple specialists on case appreciate everyone involved.  2/7: patient is improved mildly , she's off CRRT, shes off insulin drip. She is mentating.  She was evaluated by [facility].  She was unable to vocalize with PMV.  She may need PEG tube for nourishment.  2/8: patient is overnight worsened with fever and increased levophed dose.  She is negative 1.7L.   She is on zosyn.  2/9: patient is improved this am. She is giving thumbs up during examination. 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 She seems volume depleted  as well so we will start CVP monitoring and give IV LR 1L bolus today to check for volume responsiveness. Albumin also while on CRRT. PT/OT starting today. Type and screen repeat due to plan for OR in am. On zosyn with ID following. Starting [place] heparin today.   2/10: patient is s/p repeat surgery on RLE. She is on levophed 75mg post surgery. She is not tachycardic and able to follow some verbal communication.  2/11: patient stable overnight. Reordering PT/OT today. Wbc count slightly trending up remains on abx probably post surgical.  Remains severely anemic on CRRT and vasopressor support with tracheostomy.  2/12: patient with severe anemia today.  Reviewed with renal team for blood transfusion today.  2/13: remains critically ill, on pressors and CRRT 2/13: trach changed for a #8 cuffed distal XLT without difficulty. Wound cleaner. PER ENT 2/14: remains on pressors, CRRT, VENT, plan for PEG and WOUND vac change at bedside; received 1 unit of PRBC's due to anemia  2/15: s/p IVC filter placement due to inability to tolerate anticoagulation  2/16: plan for Trach collar trials as tolerated.  Continues on CRRT. Pt refusing nursing care and to work with PT/OT, Palliative Care re-consulted 2/17: Pt seems depressed, refusing nursing care.  Consult Psychiatry.  Consults:  PCCM General Surgery  Nephrology ID  Speech  ENT   Significant Diagnostic Tests:  1/21: Chest Xray> no active cardiopulmonary process 1/21: CT left lower leg>Diffuse subcutaneous edema may represent cellulitis. No drainable fluid collection/abscess 1/21: Ultrasound lower unilateral left> no DVT  Micro Data:  1/21: SARS-CoV-2 PCR>>negative 1/21: Influenza PCR>>negative 1/21: Blood culture x2>>group A strep (strep pyogenes) 1/21: Urine Culture>negative  1/21: MRSA PCR>>negative  1/24: Left leg wound culture>>negative  2/7: Trach site would culture>>few pseudomonas aeruginosa and few candida albicans  2/8: MRSA PCR>> negative  Antimicrobials:  Vancomycin 1/21>>1/22 Cefepime 1/21>>01/22 Clindamycin 1/22>>1/25 Ceftriaxone 1/21 X1; restarted 1/31>>2/6 Penicillin G 1/22>>1/31 Linezolid 1/25>>1/31 Metronidazole 02/6 x1 dose Unasyn 2/7>>2/7 Zosyn 2/7>>2/17   OBJECTIVE   Blood pressure (!) 96/49, pulse (!) 112, temperature 99.7 F (37.6 C), temperature source Axillary, resp. rate (!) 24, height _0  (1.651 m), weight 111.7 kg, SpO2 100 %.    Vent Mode: PRVC FiO2 (%):  [28 %-40 %] 40 % Set Rate:  [20 bmp] 20 bmp Vt Set:  [460 mL] 460 mL PEEP:  [5 cmH20-10 cmH20] 5 cmH20 Plateau Pressure:  [14 cmH20] 14 cmH20   Intake/Output Summary (Last 24 hours) at 05/31/2021 05732Last data filed at 05/31/2021 02025Gross per 24 hour  Intake 2072.66 ml  Output 2082 ml  Net -9.34 ml    Filed Weights   05/29/21 0500 05/30/21 0500 05/31/21 0500  Weight: 117.1 kg 116.1 kg 111.7 kg     Physical Examination  General: acute on chronically ill appearing female, NAD resting in bed on vent HENT: supple, no JVD, size 8 XLT tracheostomy present dressing dry/intact  Lungs: diminished throughout, no wheezing or rales noted, overbreathes the vent, even, non labored  Cardiovascular: Tachycardia, regular rhythm, no M/R/G, 2+ radial/2+ distal pulses Abdomen: +BS x4, obese, soft, non tender, non distended  Extremities: LLE negative pressure dressing present clean dry and intact Neuro: Awake, intermittently follows commands (refuses at times), generalized weakness (but no focal deficits) PERRL.  Affect is very flat GU: deferred anuric  Skin: see below    Labs/imaging that I havepersonally reviewed  (right click and "Reselect all SmartList Selections" daily)  All labs reviewed 05/31/2021   Labs   CBC:  Recent Labs  Lab 05/27/21 0455 05/28/21 0439 05/28/21 1127 05/29/21 0422 05/30/21 0430 05/31/21 0306  WBC 18.4* 13.7*  --  9.8 9.4 10.8*  NEUTROABS 16.5* 12.2*  --  8.8* 8.7* 9.9*  HGB 7.5* 6.8* 7.6* 8.4* 8.7* 8.0*  HCT 24.2* 22.1* 24.8* 27.3* 28.1* 27.0*  MCV 94.5 97.4  --  94.1 94.6 96.8  PLT 148* 183  --  184 199 173     Basic Metabolic Panel: Recent Labs  Lab 05/27/21 0455 05/27/21 1551 05/28/21 0439 05/28/21 1601 05/29/21 0422 05/29/21 1531  05/30/21 0430 05/30/21 1532 05/31/21 0306  NA 136   < > 136   < > 137 137 137 136 137  K 3.6   < > 3.8   < > 4.2 4.3 4.5 4.2 5.2*  CL 99   < > 104   < > 105 104 104 108 104  CO2 26   < > 25   < > _0 GLUCOSE 256*   < > 135*   < > 143* 147* 209* 233* 301*  BUN 47*   < > 54*   < > 37* 37* 36* 32* 40*  CREATININE 1.36*   < > 1.34*   < > 1.33* 1.34* 1.27* 1.14* 1.45*  CALCIUM 9.3   < > 9.0   < > 9.1 9.0 9.0 7.8* 8.4*  MG 2.0  --  2.2  --  2.5*  --  2.6*  --  2.5*  PHOS 2.3*   < > 3.3   < > 3.5 3.6 3.1 2.3* 2.9   < > = values in this interval not displayed.    GFR: Estimated Creatinine Clearance: 43.7 mL/min (A) (by C-G formula based on SCr of 1.45 mg/dL (H)). Recent Labs  Lab 05/28/21 0439 05/29/21 0422 05/30/21 0430 05/31/21 0306  WBC 13.7* 9.8 9.4 10.8*     Liver Function Tests: Recent Labs  Lab 05/29/21 0422 05/29/21 1531 05/30/21 0430 05/30/21 1532 05/31/21 0306  AST  --   --   --   --  75*  ALT  --   --   --   --  104*  ALKPHOS  --   --   --   --  206*  BILITOT  --   --   --   --  1.6*  PROT  --   --   --   --  5.8*  ALBUMIN 3.1* 2.9* 2.7* 2.5* 2.5*   2.4*    No results for input(s): LIPASE, AMYLASE in the last 168 hours. No results for input(s): AMMONIA in the last 168 hours.  ABG    Component Value Date/Time   PHART 7.40 05/14/2021 0341   PCO2ART 45 05/14/2021 0341   PO2ART 129 (H) 05/14/2021 0341   HCO3 27.9 05/14/2021 0341   ACIDBASEDEF 12.8 (H) 05/11/2021 0603   O2SAT 98.9 05/14/2021 0341     Coagulation Profile: Recent Labs  Lab 05/28/21 1601  INR 1.2     Cardiac Enzymes: No results for input(s): CKTOTAL, CKMB, CKMBINDEX, TROPONINI in the last 168 hours.  HbA1C: Hgb A1c MFr Bld  Date/Time Value Ref Range Status  05/05/2021 04:53 AM 6.7 (H) 4.8 - 5.6 % Final    Comment:    (NOTE)         Prediabetes: 5.7 - 6.4         Diabetes: >6.4         Glycemic control for adults with diabetes: <  7.0   02/11/2021 09:41 AM 6.3 (H)  <5.7 % of total Hgb Final    Comment:    For someone without known diabetes, a hemoglobin  A1c value between 5.7% and 6.4% is consistent with prediabetes and should be confirmed with a  follow-up test. . For someone with known diabetes, a value <7% indicates that their diabetes is well controlled. A1c targets should be individualized based on duration of diabetes, age, comorbid conditions, and other considerations. . This assay result is consistent with an increased risk of diabetes. . Currently, no consensus exists regarding use of hemoglobin A1c for diagnosis of diabetes for children. .     CBG: Recent Labs  Lab 05/30/21 1513 05/30/21 1924 05/30/21 2321 05/31/21 0310 05/31/21 0810  GLUCAP 211* 190* 233* 277* 278*     Review of Systems:   Unable to assess pt mechanically ventilated via tracheostomy, pt also refuses to answer questions  Past Medical History  She,  has a past medical history of AKI (acute kidney injury) ([place]), Arthritis, Asthma, Bacteremia, COPD (chronic obstructive pulmonary disease) ([place]), Diabetes mellitus without complication ([place]), Endometriosis, GERD (gastroesophageal reflux disease), History of echocardiogram, Hypertension, Obesity, PAF (paroxysmal atrial fibrillation) ([place]), and Sleep apnea.   Surgical History    Past Surgical History:  Procedure Laterality Date   ABDOMINAL HYSTERECTOMY     APPLICATION OF WOUND VAC  05/10/2021   Procedure: APPLICATION OF WOUND VAC;  Surgeon: Herbert Pun, MD;  Location: [facility] OR[facility];  Service: General;;   APPLICATION OF WOUND VAC Left 05/17/2021   Procedure: APPLICATION OF WOUND VAC/WOUND VAC EXCHANGE-Matrix Myriad;  Surgeon: Herbert Pun, MD;  Location: [facility];  Service: General;  Laterality: Left;   APPLICATION OF WOUND VAC  05/24/2021   Procedure: APPLICATION OF WOUND VAC;  Surgeon: Herbert Pun, MD;  Location: [facility];  Service: General;;   COLONOSCOPY  10/29/2006   Dr Allen Norris    COLONOSCOPY WITH PROPOFOL N/A 11/05/2016   Procedure: COLONOSCOPY WITH PROPOFOL;  Surgeon: Robert Bellow, MD;  Location: [facility];  Service: Endoscopy;  Laterality: N/A;   INCISION AND DRAINAGE OF WOUND Left 05/10/2021   Procedure: IRRIGATION AND DEBRIDEMENT LEFT LEG;  Surgeon: Herbert Pun, MD;  Location: [facility];  Service: General;  Laterality: Left;   INCISION AND DRAINAGE OF WOUND Left 05/24/2021   Procedure: IRRIGATION AND DEBRIDEMENT LEFT LEG;  Surgeon: Herbert Pun, MD;  Location: [facility];  Service: General;  Laterality: Left;   IVC FILTER INSERTION N/A 05/29/2021   Procedure: IVC FILTER INSERTION;  Surgeon: Katha Cabal, MD;  Location: [place] [facility];  Service: Cardiovascular;  Laterality: N/A;   MINOR GRAFT APPLICATION  07/03/2246   Procedure: Myriad Matrix  APPLICATION;  Surgeon: Herbert Pun, MD;  Location: [facility];  Service: General;;   NASAL SINUS SURGERY  2002   Dr Carlis Abbott   PEG PLACEMENT N/A 05/28/2021   Procedure: PERCUTANEOUS ENDOSCOPIC GASTROSTOMY (PEG) PLACEMENT;  Surgeon: Benjamine Sprague, DO;  Location: [facility];  Service: General;  Laterality: N/A;  TRAVEL CASE   TRACHEOSTOMY TUBE PLACEMENT N/A 05/17/2021   Procedure: TRACHEOSTOMY;  Surgeon: Clyde Canterbury, MD;  Location: [facility];  Service: ENT;  Laterality: N/A;   WOUND DEBRIDEMENT Left 05/07/2021   Procedure: DEBRIDEMENT WOUND;  Surgeon: Herbert Pun, MD;  Location: [facility];  Service: General;  Laterality: Left;     Social History   reports that she quit smoking about 20 years ago. Her smoking use included cigarettes. She has a 80.00 pack-year smoking history.  She quit smokeless tobacco use about 20 years ago.  Her smokeless tobacco use included snuff. She reports that she does not drink alcohol and does not use drugs.   Family History   Her family history includes Alcohol abuse in her brother; Breast cancer (age of onset: 56) in her sister; Congestive Heart Failure  in her mother; Coronary artery disease (age of onset: 78) in her father; Heart disease in her brother; Varicose Veins in her brother.   Allergies No Known Allergies   Home Medications  Prior to Admission medications   Medication Sig Start Date End Date Taking? Authorizing Provider  albuterol (PROVENTIL) (2.5 MG/3ML) 0.083% nebulizer solution Take 3 mLs (2.5 mg total) by nebulization every 6 (six) hours as needed for wheezing or shortness of breath. 10/03/19   Steele Sizer, MD  aspirin 81 MG tablet Take 1 tablet (81 mg total) by mouth daily. 01/08/16   Steele Sizer, MD  atorvastatin (LIPITOR) 40 MG tablet Take 1 tablet (40 mg total) by mouth daily. 02/11/21   Steele Sizer, MD  azelastine (ASTELIN) 0.1 % nasal spray Place 2 sprays into both nostrils 2 (two) times daily as needed for rhinitis or allergies. 07/31/20   Delsa Grana, PA-C  baclofen (LIORESAL) 10 MG tablet Take 10 mg by mouth daily. 04/16/21   [provider]  calcium carbonate (OSCAL) 1500 (600 Ca) MG TABS tablet Take 600 mg of elemental calcium by mouth 2 (two) times daily with a meal.    [provider]  celecoxib (CELEBREX) 200 MG capsule Take 200 mg by mouth daily. 04/03/21   [provider]  Cetirizine HCl (ZYRTEC ALLERGY) 10 MG TBDP Take 10 mg by mouth at bedtime. For sinus symptoms and seasonal allergies 07/31/20   Delsa Grana, PA-C  docusate sodium (COLACE) 100 MG capsule Take 100 mg by mouth daily.    [provider]  fluticasone Asencion Islam) 50 MCG/ACT nasal spray  08/31/20   [provider]  Fluticasone-Umeclidin-Vilant (TRELEGY ELLIPTA) 100-62.5-25 MCG/INH AEPB Inhale 1 puff into the lungs daily. In place of Stiolto 02/10/20   Steele Sizer, MD  glucose blood (ACCU-CHEK AVIVA PLUS) test strip Use as instructed 12/08/17   Steele Sizer, MD  Lancets (ACCU-CHEK SOFT TOUCH) lancets Use as instructed 12/08/17   Steele Sizer, MD  loratadine (CLARITIN) 10 MG tablet Take 1 tablet  (10 mg total) by mouth daily. 02/11/21   Steele Sizer, MD  meloxicam (MOBIC) 15 MG tablet Take 15 mg by mouth daily. 04/23/21   [provider]  metFORMIN (GLUCOPHAGE-XR) 750 MG 24 hr tablet Take 1 tablet (750 mg total) by mouth daily with breakfast. 02/11/21   Ancil Boozer, Drue Stager, MD  montelukast (SINGULAIR) 10 MG tablet Take 1 tablet (10 mg total) by mouth at bedtime. 02/11/21   Steele Sizer, MD  Multiple Vitamins-Minerals (MULTIVITAL PO) Take 1 tablet by mouth daily.    [provider]  omeprazole (PRILOSEC) 20 MG capsule Take 1 capsule (20 mg total) by mouth daily. 02/11/21   Steele Sizer, MD  [place] 30 MG/2ML SOSY  10/09/20   [provider]  triamterene-hydrochlorothiazide (DYAZIDE) 37.5-25 MG capsule TAKE 1 CAPSULE EVERY DAY 05/02/21   Steele Sizer, MD  vitamin B-12 (CYANOCOBALAMIN) 100 MCG tablet Take 100 mcg by mouth daily.    [provider]  Scheduled Meds:  amiodarone  200 mg Per Tube Daily   vitamin C  500 mg Per Tube BID   chlorhexidine gluconate (MEDLINE KIT)  15 mL Mouth Rinse BID  Chlorhexidine Gluconate Cloth  6 each Topical Q0600   Darbepoetin Alfa  100 mcg Subcutaneous Q7 days   escitalopram  10 mg Per Tube Daily   free water  30 mL Per Tube Q4H   gentamicin irrigation  80 mg Irrigation QID   hydrocortisone sod succinate (SOLU-CORTEF) inj  100 mg Intravenous Q12H   insulin aspart  0-20 Units Subcutaneous Q4H   insulin aspart  4 Units Subcutaneous Q4H   insulin detemir  17 Units Subcutaneous BID   ipratropium-albuterol  3 mL Nebulization Q6H   magic mouthwash w/lidocaine  10 mL Oral QID   mouth rinse  15 mL Mouth Rinse 10 times per day   midodrine  10 mg Per Tube TID WC   multivitamin  1 tablet Per Tube QHS   nutrition supplement (JUVEN)  1 packet Per Tube BID BM   oxyCODONE  5 mg Per Tube Q6H   pantoprazole (PROTONIX) IV  40 mg Intravenous Q24H   sodium chloride flush  10-40 mL Intracatheter Q12H   vitamin A  10,000 Units  Per Tube Daily   zinc sulfate  220 mg Per Tube Daily   Continuous Infusions:   prismasol BGK 4/2.5 400 mL/hr at 05/30/21 2130    prismasol BGK 4/2.5 400 mL/hr at 05/30/21 2130   feeding supplement (PIVOT 1.5 CAL) 1,000 mL (05/31/21 0652)   norepinephrine (LEVOPHED) Adult infusion 8.96 mcg/min (05/31/21 0741)   piperacillin-tazobactam (ZOSYN)  IV Stopped (05/31/21 0654)   prismasol BGK 4/2.5 1,000 mL/hr at 05/30/21 2345   PRN Meds:.acetaminophen, albuterol, artificial tears, clonazePAM, docusate, fentaNYL (SUBLIMAZE) injection, heparin, lip balm, ondansetron (ZOFRAN) IV, oxyCODONE, sennosides, sodium chloride, sodium chloride flush  Assessment & Plan:   Ventilator Dependent Respiratory Failure s/p tracheostomy  ATELECTASIS, CHF PULMONARY EDEMA AND PLEURAL EFFUSIONS ALL PRESENT ON ADMISSION Hx: COPD, OSA, and Obesity - Trach collar trials as tolerated with mechanical ventilation qhs for now  - Wean Fio2 and PEEP as tolerated - VAP/VENT bundle implementation - Wean PEEP & FiO2 as tolerated, maintain SpO2 >88% - VAP protocol in place - Plateau pressures less than 30 cm H20  - Intermittent chest x-ray & ABG PRN - Ensure adequate pulmonary hygiene   - Per speech therapy trach will need to be downsized to size 6 when pt able to tolerate to be considered for PMV therapy    Septic shock Atrial fibrillation, converted to NSR -Continuous cardiac monitoring -Maintain MAP >65 -Vasopressors as needed to maintain MAP goal -Continue scheduled midodrine   -Continue stress dose steroids due to concerns of adrenal insufficiency  -Continue amiodarone   Severe Sepsis SOURCE-left necrotizing fascitis (strep pyogenes)-present on admission s/p multiple debridements, application of wound VAC system, s/p myriad matrix placement 05/17/2021 Streptococcal toxic shock syndrome Pseudomonas in Tracheal wound culture (? respiratory secretions coating the tracheostomy) -Monitor fever curve -Trend WBC's   -Follow cultures as above -Continue Zosyn as per ID (end date 05/31/21) -General Surgery following, appreciate input ~ wound care as per Surgery  Acute kidney injury secondary to ischemic nephropathy in the setting of hypotension complicated with sepsis  - Avoid nephrotoxic agents - Follow urine output, BMP - Ensure adequate renal perfusion, optimize oxygenation - Renal dose medications - Nephrology consulted appreciate input~per recommendations continue CRRT with net even ultrafiltration    Shock liver   - Monitor hepatic function panel   - Monitor coags   Acute on chronic anemia~pt unable to tolerate chemical anticoagulation s/p IVC filter placement 05/29/2021 -Monitor for S/Sx of bleeding -Trend  CBC -Unable to tolerate anticoagulants or SCD's for VTE Prophylaxis ~ IVC filter placed 2/15 -Transfuse for Hgb <7  Type II diabetes mellitus  -CBG's q4h; Target range of 140 to 180 -SSI -Follow ICU Hypo/Hyperglycemia protocol    Toxic metabolic encephalopathy~improving  Small SAH  Appears Depressed NO ANTICOAGULATION FOR  1 MONTH AND THEN NEED TO RE-ASSESS NO ANTIPLATELET AGENTS FOR 1 MONTH AND THEN NEED TO RE-ASSESS CAN POSSIBLY CONSIDER BABY ASA BUT WOULD NEED TO PROCEED WITH CAUTION -Continue Lexapro -Psychiatry consulted, appreciate input  Best practice:  Diet: Tube Feeds  Pain/Anxiety/Delirium protocol (if indicated): prn fentanyl  VAP protocol (if indicated): Ordered  DVT prophylaxis: Unable to tolerate chemical prophylaxis s/p IVC filter placement  GI prophylaxis: PPI Glucose control:  SSI Yes Central venous access: Right IJ CVL; Left IJ Dual Lumen Dialysis Catheter Arterial line:  N/A Foley:  N/A Mobility:  bed rest  PT consulted: N/A Last date of multidisciplinary goals of care discussion [05/31/21] Code Status:  full code Disposition: ICU   = Goals of Care = Code Status Order: ICU  Primary Emergency ContactCANDIS, KABEL, Home Phone:  [phone] Discussed plan of care with pts daughter Helane Rima   Critical care time: 40 minutes    Darel Hong, AGACNP-BC [facility] [place] epic messenger for cross cover needs If after hours, please call E-link

## 2021-05-31 NOTE — Consult Note (Signed)
Psychiatry: Consult placed for this 71 year old woman in the intensive care unit out of concern that she has become depressed during her lengthy hospitalization.  Chart reviewed.  Came by to see patient this evening but she had been started back on a ventilator.  Unable to participate in any interview which would have made any assessment really impossible.  Please let us know when she is able to participate in conversation and we will be happy to see her.

## 2021-05-31 NOTE — Progress Notes (Signed)
PT Cancellation Note  Patient Details Name: Alexandra Foster MRN: [id number] DOB: 1950-09-11   Cancelled Treatment:    Reason Eval/Treat Not Completed: Other (comment) Attempted to see pt for PT tx with pt's sister present & providing encouragement. Upon PT arrival nurse was present & attempting to administer medication but pt adamantly refusing. PT asks pt ~8 questions with yes/no responses with pt accurately answering 80% of them. Pt declines participation & when asked if she wants to get better pt shakes her head no. Pt declines participation despite sister's encouragement. Per protocol, pt has declined participation 3x so PT will sign off at this time. Please re-consult if changes occur.  Lavone Nian, PT, DPT 05/31/21, 12:28 PM   [name] 05/31/2021, 12:24 PM

## 2021-05-31 NOTE — Progress Notes (Signed)
Patient ID: Geralynn Capri, female   DOB: 1950/12/27, 71 y.o.   MRN: [id number]     [place] Day(s): 27.   Interval History: Patient seen and examined, no acute events or new complaints overnight.  She is again on the vent.  Vital signs in last 24 hours: [min-max] current  Temp:  [98.2 F (36.8 C)-100.3 F (37.9 C)] 98.9 F (37.2 C) (02/17 1200) Pulse Rate:  [70-116] 95 (02/17 1300) Resp:  [12-25] 12 (02/17 1300) BP: (88-137)/(41-122) 119/63 (02/17 1300) SpO2:  [84 %-100 %] 100 % (02/17 1300) FiO2 (%):  [28 %-40 %] 40 % (02/17 1200) Weight:  [111.7 kg] 111.7 kg (02/17 0500)     Height: '5\' 5"'  (165.1 cm) Weight: 111.7 kg BMI (Calculated): 40.98   Physical Exam:  Constitutional: alert, cooperative and no distress  Extremity: Left leg negative pressure dressing removed.  The most likely is getting adequate granulation tissue.  Dehiscence of myriad matrix.  No ischemic or purulent tissue.  Labs:  CBC Latest Ref Rng & Units 05/31/2021 05/30/2021 05/29/2021  WBC 4.0 - 10.5 K/uL 10.8(H) 9.4 9.8  Hemoglobin 12.0 - 15.0 g/dL 8.0(L) 8.7(L) 8.4(L)  Hematocrit 36.0 - 46.0 % 27.0(L) 28.1(L) 27.3(L)  Platelets 150 - 400 K/uL 173 199 184   CMP Latest Ref Rng & Units 05/31/2021 05/30/2021 05/30/2021  Glucose 70 - 99 mg/dL 301(H) 233(H) 209(H)  BUN 8 - 23 mg/dL 40(H) 32(H) 36(H)  Creatinine 0.44 - 1.00 mg/dL 1.45(H) 1.14(H) 1.27(H)  Sodium 135 - 145 mmol/L 137 136 137  Potassium 3.5 - 5.1 mmol/L 5.2(H) 4.2 4.5  Chloride 98 - 111 mmol/L 104 108 104  CO2 22 - 32 mmol/L '26 23 26  ' Calcium 8.9 - 10.3 mg/dL 8.4(L) 7.8(L) 9.0  Total Protein 6.5 - 8.1 g/dL 5.8(L) - -  Total Bilirubin 0.3 - 1.2 mg/dL 1.6(H) - -  Alkaline Phos 38 - 126 U/L 206(H) - -  AST 15 - 41 U/L 75(H) - -  ALT 0 - 44 U/L 104(H) - -    Imaging studies: No new pertinent imaging studies   Assessment/Plan:  71 y.o. female with left leg necrotizing fasciitis with 24 Day Post-Op s/p debridement, complicated  by pertinent comorbidities including strep pyogenes bacteremia now in septic shock (resolved), respiratory failure mechanical ventilation (resolved), liver failure (resolved), acute renal failure on CRRT, COPD, type 2 diabetes mellitus, hypertension, obstructive sleep apnea, GERD   Left leg necrotizing fasciitis -S/p debridement 05/07/2021 -Second debridement on 05/10/2021.  Application of wound VAC system -S/p myriad matrix placement on 05/17/2021 -Today I took out the negative pressure dressing.  Patient is starting to get adequate granulation tissue on the proximal muscular area.  There is dehiscence of myriad matrix.  No sign of infection or ischemia -I personally placed back and you were negative pressure dressing (DME).  Adequate seal was achieved. -I will continue to follow along.  Arnold Long, MD

## 2021-05-31 NOTE — Progress Notes (Signed)
CRRT stopped @ 2140 d/t increasing filter pressures. Blood return successful.   Restarted @ 8502 w/ no complications.

## 2021-05-31 NOTE — Progress Notes (Addendum)
1235 SEDATED FOR  LEFT LOWER LEG DRESSING PER DR. CINTRON-DIAZ. EXTENSIVE AND PAINFUL LEFT LEG DRESSING CHANGE. PATIENT TOLERATED PROCEDURE FAIR. HUSBAND BACK IN AFTER DRESSING CHANGE.

## 2021-05-31 NOTE — Progress Notes (Addendum)
0730 Report  received. Patient waving nurse away and trying to push against nurse just to start morning assessment. Explained  to patient that we have to take care of her ie mouth care, ventilator care and listen to her lungs.  Patient tried to push away NP when he listened to her lungs. Patient shook head no to all of her am care,assessment,etc. Explained to patient that daughter had decided that she wanted everything done in her care.Patient shook her head no. RT also explained code status to patient. Patient shook her head no and mouthed no. All am care done due to Full code status. 1300 Changes made to CRRT per orders. PBP increased to  500. Replacement flow rate decreased to 300. Family in and out all day Husband begging her to get better and come home. Sister fussing because patient refuses care from nurses. None of the family understanding when the doctors tell them she will probably not be able to come home. Patient continues to ignore family and refuse care.

## 2021-05-31 NOTE — Progress Notes (Signed)
Pt refusing oral care. Swatting hands at staff, nodding head no, and refusing to open mouth. Educated pt on importance of consistent mouth care and offered Magic mouth wash to help with oral pain. Pt still refusing. Will continue to educate and encourage pt to participate in care.

## 2021-05-31 NOTE — Progress Notes (Signed)
[facility]  PROGRESS NOTE   Subjective:   Patient remains critically ill, requiring ventilator support CRRT continued. UF goal of net even. UOP minimal. Blood flow rate 200 cc/min Norepinephrine gtt continued.  Rate is higher today at 9  PRBC transfusion over last weekend and tuesday Underwent PEG and wound vac change on Tuesday Nursing staff reports frequent requirement for dialyzer change every 8 hours almost.  Patient has bleeding from the mouth after mouth care.  She is agitated and pushing caregivers away.  Tube feeds continued at 65 cc/h.  Objective:  Vital signs in last 24 hours:  Temp:  [98.2 F (36.8 C)-100.3 F (37.9 C)] 99.7 F (37.6 C) (02/17 0610) Pulse Rate:  [58-112] 112 (02/17 0815) Resp:  [18-26] 24 (02/17 0815) BP: (88-137)/(41-67) 96/49 (02/17 0815) SpO2:  [84 %-100 %] 100 % (02/17 0815) FiO2 (%):  [28 %-40 %] 40 % (02/17 0800) Weight:  [111.7 kg] 111.7 kg (02/17 0500)  Weight change: -4.4 kg Filed Weights   05/29/21 0500 05/30/21 0500 05/31/21 0500  Weight: 117.1 kg 116.1 kg 111.7 kg    Intake/Output: I/O last 3 completed shifts: In: 2805.4 [I.V.:312.3; Other:600; NG/GT:1650; IV Piggyback:243.1] Out: 2575 [Drains:275; LHTDS:2876; Stool:125]   Intake/Output this shift:  Total I/O In: 40 [Other:40] Out: 160 [Other:160]  Physical Exam: General:  Critically ill  Head:  [place]/AT, some bleeding noted around the lips  Eyes:  Anicteric  Neck:  Trachea collar, tracheostomy, vent assisted  Lungs:   diminished bilaterally,    Heart:  regular,  Abdomen:   Soft, nontender  Extremities:  Left lower extremity wound vac  Neurologic: Awake, agitated  Skin:  No lesions  Access: Left IJ temp HD catheter    Basic Metabolic Panel: Recent Labs  Lab 05/27/21 0455 05/27/21 1551 05/28/21 0439 05/28/21 1601 05/29/21 0422 05/29/21 1531 05/30/21 0430 05/30/21 1532 05/31/21 0306  NA 136   < > 136   < > 137 137 137 136 137  K 3.6   < > 3.8   < >  4.2 4.3 4.5 4.2 5.2*  CL 99   < > 104   < > 105 104 104 108 104  CO2 26   < > 25   < > '26 25 26 23 26  ' GLUCOSE 256*   < > 135*   < > 143* 147* 209* 233* 301*  BUN 47*   < > 54*   < > 37* 37* 36* 32* 40*  CREATININE 1.36*   < > 1.34*   < > 1.33* 1.34* 1.27* 1.14* 1.45*  CALCIUM 9.3   < > 9.0   < > 9.1 9.0 9.0 7.8* 8.4*  MG 2.0  --  2.2  --  2.5*  --  2.6*  --  2.5*  PHOS 2.3*   < > 3.3   < > 3.5 3.6 3.1 2.3* 2.9   < > = values in this interval not displayed.     CBC: Recent Labs  Lab 05/27/21 0455 05/28/21 0439 05/28/21 1127 05/29/21 0422 05/30/21 0430 05/31/21 0306  WBC 18.4* 13.7*  --  9.8 9.4 10.8*  NEUTROABS 16.5* 12.2*  --  8.8* 8.7* 9.9*  HGB 7.5* 6.8* 7.6* 8.4* 8.7* 8.0*  HCT 24.2* 22.1* 24.8* 27.3* 28.1* 27.0*  MCV 94.5 97.4  --  94.1 94.6 96.8  PLT 148* 183  --  184 199 173      Urinalysis: No results for input(s): COLORURINE, LABSPEC, PHURINE, GLUCOSEU, HGBUR, BILIRUBINUR, KETONESUR,  PROTEINUR, UROBILINOGEN, NITRITE, LEUKOCYTESUR in the last 72 hours.  Invalid input(s): APPERANCEUR    Imaging: PERIPHERAL VASCULAR CATHETERIZATION  Result Date: 05/29/2021 See surgical note for result.  DG Chest Port 1 View  Result Date: 05/31/2021 CLINICAL DATA:  Acute respiratory failure with hypoxia. EXAM: PORTABLE CHEST 1 VIEW COMPARISON:  May 23, 2021. FINDINGS: Stable cardiomediastinal silhouette. Tracheostomy tube is unchanged in position. Bilateral jugular catheters are unchanged in position. Increased left basilar opacity is noted concerning for atelectasis or infiltrate. Minimal right basilar subsegmental atelectasis is noted. Bony thorax is unremarkable. IMPRESSION: Increased left basilar opacity is noted concerning for atelectasis or infiltrate. Minimal right basilar subsegmental atelectasis. Electronically Signed   By: Marijo Conception M.D.   On: 05/31/2021 06:40     Medications:     prismasol BGK 4/2.5 400 mL/hr at 05/30/21 2130    prismasol BGK 4/2.5 400  mL/hr at 05/30/21 2130   feeding supplement (PIVOT 1.5 CAL) 1,000 mL (05/31/21 7628)   norepinephrine (LEVOPHED) Adult infusion 8.96 mcg/min (05/31/21 0741)   piperacillin-tazobactam (ZOSYN)  IV Stopped (05/31/21 0654)   prismasol BGK 4/2.5 1,000 mL/hr at 05/30/21 2345    amiodarone  200 mg Per Tube Daily   vitamin C  500 mg Per Tube BID   chlorhexidine gluconate (MEDLINE KIT)  15 mL Mouth Rinse BID   Chlorhexidine Gluconate Cloth  6 each Topical Q0600   Darbepoetin Alfa  100 mcg Subcutaneous Q7 days   escitalopram  10 mg Per Tube Daily   free water  30 mL Per Tube Q4H   gentamicin irrigation  80 mg Irrigation QID   hydrocortisone sod succinate (SOLU-CORTEF) inj  100 mg Intravenous Q12H   insulin aspart  0-20 Units Subcutaneous Q4H   insulin aspart  4 Units Subcutaneous Q4H   insulin detemir  17 Units Subcutaneous BID   ipratropium-albuterol  3 mL Nebulization Q6H   magic mouthwash w/lidocaine  10 mL Oral QID   mouth rinse  15 mL Mouth Rinse 10 times per day   midodrine  10 mg Per Tube TID WC   multivitamin  1 tablet Per Tube QHS   nutrition supplement (JUVEN)  1 packet Per Tube BID BM   oxyCODONE  5 mg Per Tube Q6H   pantoprazole (PROTONIX) IV  40 mg Intravenous Q24H   sodium chloride flush  10-40 mL Intracatheter Q12H   vitamin A  10,000 Units Per Tube Daily   zinc sulfate  220 mg Per Tube Daily    Assessment/ Plan:     Principal Problem:   Severe sepsis with septic shock (CODE) (HCC) Active Problems:   COPD (chronic obstructive pulmonary disease) (HCC)   Morbid obesity (HCC)   Diabetes mellitus type 2 in obese (HCC)   Atrial fibrillation and flutter ([place])   Necrotizing fasciitis ([place])   Acute on chronic respiratory failure ([place])   On mechanically assisted ventilation (HCC)   Shock liver   Anemia associated with acute blood loss   Metabolic acidosis   Encounter for continuous renal replacement therapy (CRRT) for acute renal failure (HCC)   Acute encephalopathy    Group A streptococcal infection   Streptococcal toxic shock syndrome ([place])   Status post tracheostomy ([place])   Subdural hemorrhage ([place])   Left leg cellulitis  Ms. Skya Mccullum Bernardini is a 71 y.o. black female with COPD/asthma, diabetes mellitus type II, hypertension, obstructive sleep apnea, paroxysmal atrial fibrillation, GERD, who presents to [facility] on 05/04/2021 for AKI (acute kidney injury) ([place]) [N17.9]  Left leg cellulitis [L03.116] Severe sepsis (HCC) [A41.9, R65.20] Severe sepsis with lactic acidosis ([place]) [A41.9, R65.20, E87.20] Found to have necrotizing fascitis requiring multiple debridements by Dr. Peyton Najjar: 1/24. 1/27, 2/3 and 2/10.    #1: Acute kidney injury: AKI is secondary to ischemic nephropathy secondary to hypotension complicated with sepsis. History of bland urine. Baseline creatinine of 0.98, GFR > 60 on 02/11/21.  -Anuric at present.  - Continue continuous renal replacement therapy. Net even ultrafiltration.  - Change to 2 K bath as K is trending higher  - pre filter 500, dialysate 1000, post filter 300  - too unstable for IHD today, re-evaluate daily    #2: Sepsis and hypotension: necrotizing fasciitis for left lower extremity status post multiple debridement.  Requiring vasopressors: norepinephrine. On stress dose steroids and midodrine -Antibiotics as per ID and primary team  #3. Anemia with Acute kidney injury:  Lab Results  Component Value Date   HGB 8.0 (L) 05/31/2021    S/p blood transfusions this admission Aranesp weekly SQ (mon)  #4. Acute Respiratory Failure Ventilator assisted. Management as per ICU team    LOS: [place], MD [facility] 2/17/20239:51 AM

## 2021-05-31 NOTE — Progress Notes (Signed)
Inpatient Diabetes Program Recommendations  AACE/ADA: New Consensus Statement on Inpatient Glycemic Control (2015)  Target Ranges:  Prepandial:   less than 140 mg/dL      Peak postprandial:   less than 180 mg/dL (1-2 hours)      Critically ill patients:  140 - 180 mg/dL   Lab Results  Component Value Date   GLUCAP 278 (H) 05/31/2021   HGBA1C 6.7 (H) 05/05/2021    Review of Glycemic Control  Latest Reference Range & Units 05/30/21 07:14 05/30/21 11:09 05/30/21 15:13 05/30/21 19:24 05/30/21 23:21 05/31/21 03:10 05/31/21 08:10  Glucose-Capillary 70 - 99 mg/dL 229 (H) 171 (H) 211 (H) 190 (H) 233 (H) 277 (H) 278 (H)  (H): Data is abnormally high  Inpatient Diabetes Program Recommendations:   CBGs increased with increase in tube feed. Please consider: -Increase Novolog feed coverage to 6 unitsq 4 hrs. (Hold if tf held or stopped for any reason)  Thank you, Nani Gasser. Marcas Bowsher, RN, MSN, CDE  Diabetes Coordinator Inpatient Glycemic Control Team Team Pager [phone] (8am-5pm) 05/31/2021 10:35 AM

## 2021-05-31 NOTE — Progress Notes (Signed)
CRRT filter clotted, big clots found in blood warmer and top of the filter at 0142. Stopped CRRT and replacing filter and pt dialysis catheter was heparinized.

## 2021-06-01 ENCOUNTER — Inpatient Hospital Stay: Payer: Medicare HMO

## 2021-06-01 DIAGNOSIS — A419 Sepsis, unspecified organism: Secondary | ICD-10-CM | POA: Diagnosis not present

## 2021-06-01 LAB — RENAL FUNCTION PANEL
Albumin: 3.2 g/dL — ABNORMAL LOW (ref 3.5–5.0)
Albumin: 3.2 g/dL — ABNORMAL LOW (ref 3.5–5.0)
Anion gap: 10 (ref 5–15)
Anion gap: 10 (ref 5–15)
BUN: 51 mg/dL — ABNORMAL HIGH (ref 8–23)
BUN: 61 mg/dL — ABNORMAL HIGH (ref 8–23)
CO2: 24 mmol/L (ref 22–32)
CO2: 24 mmol/L (ref 22–32)
Calcium: 9.5 mg/dL (ref 8.9–10.3)
Calcium: 9.9 mg/dL (ref 8.9–10.3)
Chloride: 102 mmol/L (ref 98–111)
Chloride: 100 mmol/L (ref 98–111)
Creatinine, Ser: 1.39 mg/dL — ABNORMAL HIGH (ref 0.44–1.00)
Creatinine, Ser: 1.36 mg/dL — ABNORMAL HIGH (ref 0.44–1.00)
GFR, Estimated: 40 mL/min — ABNORMAL LOW (ref >60)
GFR, Estimated: 41 mL/min — ABNORMAL LOW (ref >60)
Glucose, Bld: 252 mg/dL — ABNORMAL HIGH (ref 70–99)
Glucose, Bld: 162 mg/dL — ABNORMAL HIGH (ref 70–99)
Phosphorus: 2.4 mg/dL — ABNORMAL LOW (ref 2.5–4.6)
Phosphorus: 2.2 mg/dL — ABNORMAL LOW (ref 2.5–4.6)
Potassium: 4.4 mmol/L (ref 3.5–5.1)
Potassium: 4.1 mmol/L (ref 3.5–5.1)
Sodium: 136 mmol/L (ref 135–145)
Sodium: 134 mmol/L — ABNORMAL LOW (ref 135–145)

## 2021-06-01 LAB — HEMOGLOBIN AND HEMATOCRIT, BLOOD
HCT: 25.1 % — ABNORMAL LOW (ref 36.0–46.0)
Hemoglobin: 7.5 g/dL — ABNORMAL LOW (ref 12.0–15.0)

## 2021-06-01 LAB — CBC WITH DIFFERENTIAL/PLATELET
Abs Immature Granulocytes: 0.06 10*3/uL (ref 0.00–0.07)
Basophils Absolute: 0 10*3/uL (ref 0.0–0.1)
Basophils Relative: 0 %
Eosinophils Absolute: 0 10*3/uL (ref 0.0–0.5)
Eosinophils Relative: 0 %
HCT: 22.2 % — ABNORMAL LOW (ref 36.0–46.0)
Hemoglobin: 6.4 g/dL — ABNORMAL LOW (ref 12.0–15.0)
Immature Granulocytes: 1 %
Lymphocytes Relative: 4 %
Lymphs Abs: 0.4 10*3/uL — ABNORMAL LOW (ref 0.7–4.0)
MCH: 28.3 pg (ref 26.0–34.0)
MCHC: 28.8 g/dL — ABNORMAL LOW (ref 30.0–36.0)
MCV: 98.2 fL (ref 80.0–100.0)
Monocytes Absolute: 0.5 10*3/uL (ref 0.1–1.0)
Monocytes Relative: 5 %
Neutro Abs: 9.4 10*3/uL — ABNORMAL HIGH (ref 1.7–7.7)
Neutrophils Relative %: 90 %
Platelets: 140 10*3/uL — ABNORMAL LOW (ref 150–400)
RBC: 2.26 MIL/uL — ABNORMAL LOW (ref 3.87–5.11)
RDW: 19.3 % — ABNORMAL HIGH (ref 11.5–15.5)
Smear Review: NORMAL
WBC: 10.5 10*3/uL (ref 4.0–10.5)
nRBC: 0.9 % — ABNORMAL HIGH (ref 0.0–0.2)

## 2021-06-01 LAB — GLUCOSE, CAPILLARY
Glucose-Capillary: 233 mg/dL — ABNORMAL HIGH (ref 70–99)
Glucose-Capillary: 195 mg/dL — ABNORMAL HIGH (ref 70–99)
Glucose-Capillary: 223 mg/dL — ABNORMAL HIGH (ref 70–99)
Glucose-Capillary: 154 mg/dL — ABNORMAL HIGH (ref 70–99)
Glucose-Capillary: 123 mg/dL — ABNORMAL HIGH (ref 70–99)
Glucose-Capillary: 149 mg/dL — ABNORMAL HIGH (ref 70–99)

## 2021-06-01 LAB — LACTIC ACID, PLASMA
Lactic Acid, Venous: 3.4 mmol/L (ref 0.5–1.9)
Lactic Acid, Venous: 3.1 mmol/L (ref 0.5–1.9)
Lactic Acid, Venous: 3 mmol/L (ref 0.5–1.9)
Lactic Acid, Venous: 2.7 mmol/L (ref 0.5–1.9)

## 2021-06-01 LAB — PREPARE RBC (CROSSMATCH)

## 2021-06-01 LAB — MAGNESIUM: Magnesium: 2.2 mg/dL (ref 1.7–2.4)

## 2021-06-01 MED ORDER — MIDAZOLAM HCL 2 MG/2ML IJ SOLN
1.0000 mg | Freq: Once | INTRAMUSCULAR | Status: AC
  Filled 2021-06-01: qty 2

## 2021-06-01 MED ORDER — DILTIAZEM HCL 25 MG/5ML IV SOLN: 5.0000 mg | Freq: Once | INTRAVENOUS | Status: DC

## 2021-06-01 MED ORDER — METOPROLOL TARTRATE 5 MG/5ML IV SOLN
INTRAVENOUS | Status: AC
  Administered 2021-06-01: 5 mg via INTRAVENOUS
  Filled 2021-06-01: qty 5

## 2021-06-01 MED ORDER — DILTIAZEM HCL 25 MG/5ML IV SOLN
INTRAVENOUS | Status: AC
  Administered 2021-06-01: 10 mg via INTRAVENOUS
  Filled 2021-06-01: qty 5

## 2021-06-01 MED ORDER — FENTANYL CITRATE (PF) 2500 MCG/50ML IJ SOLN: 50.0000 ug/h | Status: DC

## 2021-06-01 MED ORDER — FENTANYL CITRATE PF 50 MCG/ML IJ SOSY
PREFILLED_SYRINGE | INTRAMUSCULAR | Status: AC
  Administered 2021-06-01: 50 ug via INTRAVENOUS
  Filled 2021-06-01: qty 1

## 2021-06-01 MED ORDER — MIDAZOLAM HCL 2 MG/2ML IJ SOLN
INTRAMUSCULAR | Status: AC
  Administered 2021-06-01: 1 mg via INTRAVENOUS
  Filled 2021-06-01: qty 2

## 2021-06-01 MED ORDER — LACTATED RINGERS IV BOLUS
1000.0000 mL | Freq: Once | INTRAVENOUS | Status: AC
  Administered 2021-06-01: 1000 mL via INTRAVENOUS

## 2021-06-01 MED ORDER — AMIODARONE HCL 200 MG PO TABS
200.0000 mg | ORAL_TABLET | Freq: Two times a day (BID) | ORAL | Status: DC
  Administered 2021-06-01 – 2021-06-13 (×22): 200 mg
  Filled 2021-06-01 (×22): qty 1

## 2021-06-01 MED ORDER — MIDAZOLAM HCL 2 MG/2ML IJ SOLN
2.0000 mg | Freq: Once | INTRAMUSCULAR | Status: AC
  Administered 2021-06-01: 2 mg via INTRAVENOUS

## 2021-06-01 MED ORDER — MIDODRINE HCL 5 MG PO TABS
15.0000 mg | ORAL_TABLET | Freq: Three times a day (TID) | ORAL | Status: DC
  Administered 2021-06-01 – 2021-06-05 (×13): 15 mg
  Filled 2021-06-01 (×13): qty 3

## 2021-06-01 MED ORDER — DILTIAZEM HCL 25 MG/5ML IV SOLN: 10.0000 mg | Freq: Once | INTRAVENOUS | Status: AC

## 2021-06-01 MED ORDER — FENTANYL CITRATE PF 50 MCG/ML IJ SOSY
50.0000 ug | PREFILLED_SYRINGE | Freq: Once | INTRAMUSCULAR | Status: AC
  Administered 2021-06-01: 50 ug via INTRAVENOUS

## 2021-06-01 MED ORDER — HYDROCORTISONE SOD SUC (PF) 100 MG IJ SOLR
100.0000 mg | Freq: Two times a day (BID) | INTRAMUSCULAR | Status: DC
  Administered 2021-06-01 – 2021-06-05 (×9): 100 mg via INTRAVENOUS
  Filled 2021-06-01 (×8): qty 2

## 2021-06-01 MED ORDER — HYDROCORTISONE SOD SUC (PF) 100 MG IJ SOLR: 50.0000 mg | Freq: Four times a day (QID) | INTRAMUSCULAR | Status: DC

## 2021-06-01 MED ORDER — METOPROLOL TARTRATE 5 MG/5ML IV SOLN: 5.0000 mg | Freq: Once | INTRAVENOUS | Status: AC

## 2021-06-01 MED ORDER — SODIUM CHLORIDE 0.9% IV SOLUTION: Freq: Once | INTRAVENOUS | Status: DC

## 2021-06-01 MED ORDER — INSULIN ASPART 100 UNIT/ML IJ SOLN
8.0000 [IU] | INTRAMUSCULAR | Status: DC
  Administered 2021-06-01 – 2021-06-11 (×36): 8 [IU] via SUBCUTANEOUS
  Filled 2021-06-01 (×36): qty 1

## 2021-06-01 NOTE — Progress Notes (Signed)
Patient in Afib RVR sustaining in 150s.  Dr. Jonnie Finner okay to give Amio per tube early and Cardizem 10 mg IV x1.

## 2021-06-01 NOTE — Procedures (Signed)
Central Venous Catheter Insertion Procedure Note  Pristine Gladhill  [id number]  06-Jul-1950  Date:06/01/21  Time:3:14 PM   Provider Performing:Jw Covin Micheline Chapman   Procedure: Insertion of Non-tunneled Central Venous Catheter(36556) Temporary Hemodialysis Catheter Exchange   Indication(s) Hemodialysis  Consent Risks of the procedure as well as the alternatives and risks of each were explained to the patient and/or caregiver.  Consent for the procedure was obtained and is signed in the bedside chart  Anesthesia Topical only with 1% lidocaine   Timeout Verified patient identification, verified procedure, site/side was marked, verified correct patient position, special equipment/implants available, medications/allergies/relevant history reviewed, required imaging and test results available.  Sterile Technique Maximal sterile technique including full sterile barrier drape, hand hygiene, sterile gown, sterile gloves, mask, hair covering, sterile ultrasound probe cover (if used).  Procedure Description Area of catheter insertion was cleaned with chlorhexidine and draped in sterile fashion. An HD catheter was placed/exchanged into the left internal jugular vein.  Nonpulsatile blood flow and easy flushing noted in all ports.  The catheter was sutured in place and sterile dressing applied.  Complications/Tolerance None; patient tolerated the procedure well. Chest X-ray is ordered to verify placement for internal jugular or subclavian cannulation.  Chest x-ray is not ordered for femoral cannulation.  EBL Minimal  Specimen(s) None  Donell Beers, AGNP  Pulmonary/Critical Care Pager [phone] (please enter 7 digits) PCCM Consult Pager [phone] (please enter 7 digits)

## 2021-06-01 NOTE — Consult Note (Signed)
PHARMACY CONSULT NOTE  Pharmacy Consult for Electrolyte Monitoring and Replacement   Recent Labs: Potassium (mmol/L)  Date Value  06/01/2021 4.4  08/11/2013 3.3 (L)   Magnesium (mg/dL)  Date Value  06/01/2021 2.2   Calcium (mg/dL)  Date Value  06/01/2021 9.5   Calcium, Total (mg/dL)  Date Value  08/11/2013 9.2   Albumin (g/dL)  Date Value  06/01/2021 3.2 (L)  08/11/2013 3.1 (L)   Phosphorus (mg/dL)  Date Value  06/01/2021 2.4 (L)   Sodium (mmol/L)  Date Value  06/01/2021 136  08/11/2013 138   Assessment: 71 yo female with a previous medical history of diabetes mellitus type 2, hypertension, obstructive sleep apnea on CPAP, asthma/COPD, GERD, history of COVID-19 pneumonia presenting with lower left extremity pain and chills. Patient found to have severe sepsis and strep pyogenes bacteremia on admission and is currently intubated and sedated. LLL necrotizing fascitis. [facility]  Pharmacy has been consulted to monitor and replace electrolytes.  -CRRT stopped 2/6, re-started (2/8>> -s/p trach 2/3, tolerating trach collar trials  Nutrition:  --Feeds per tube @65ml /hr --Free water per tube 30 mL Q4H  Goal of Therapy:  Electrolytes within normal limits  Plan:  Continues on CRRT No electrolyte replacement warranted for today q12h Renal function panels while on CRRT  Eleonore Chiquito, PharmD, BCPS Clinical Pharmacist 06/01/2021 9:28 AM

## 2021-06-01 NOTE — Progress Notes (Signed)
Pt went into afib RVR @ 0443 w/ rates reaching 150s. Pt has remained hemodynamically stable through episode. NP notified, metoprolol 5mg  IV ordered. Metoprolol and PRN analgesic given w/ therapeutic effect. Pt now in NSR/ST.   Pt Hgb: 6.4. Type and screen + 1 unit PRBC ordered.

## 2021-06-01 NOTE — Progress Notes (Signed)
Initiated CRRT with no complications

## 2021-06-01 NOTE — Progress Notes (Signed)
Patient ID: Adonis Ryther, female   DOB: 1950/07/16, 71 y.o.   MRN: [id number]     [place] [facility] Day(s): 28.   Interval History: Patient seen and examined, no acute events or new complaints overnight.  Patient's nurse endorses no issues with negative pressure dressing.  Vital signs in last 24 hours: [min-max] current  Temp:  [97.3 F (36.3 C)-99.1 F (37.3 C)] 97.5 F (36.4 C) (02/18 0922) Pulse Rate:  [65-121] 91 (02/18 1030) Resp:  [12-24] 17 (02/18 1030) BP: (91-131)/(44-75) 114/66 (02/18 1030) SpO2:  [100 %] 100 % (02/18 1030) FiO2 (%):  [30 %-40 %] 30 % (02/18 0922) Weight:  [111.4 kg] 111.4 kg (02/18 0500)     Height: _0  (165.1 cm) Weight: 111.4 kg BMI (Calculated): 40.87   Physical Exam:  Constitutional: alert, cooperative and no distress  Extremity: Left leg with wound VAC in place with adequate seal.  Labs:  CBC Latest Ref Rng & Units 06/01/2021 06/01/2021 05/31/2021  WBC 4.0 - 10.5 K/uL - 10.5 10.8(H)  Hemoglobin 12.0 - 15.0 g/dL 7.5(L) 6.4(L) 8.0(L)  Hematocrit 36.0 - 46.0 % 25.1(L) 22.2(L) 27.0(L)  Platelets 150 - 400 K/uL - 140(L) 173   CMP Latest Ref Rng & Units 06/01/2021 05/31/2021 05/31/2021  Glucose 70 - 99 mg/dL 252(H) 235(H) 301(H)  BUN 8 - 23 mg/dL 51(H) 47(H) 40(H)  Creatinine 0.44 - 1.00 mg/dL 1.39(H) 1.32(H) 1.45(H)  Sodium 135 - 145 mmol/L 136 136 137  Potassium 3.5 - 5.1 mmol/L 4.4 4.8 5.2(H)  Chloride 98 - 111 mmol/L 102 102 104  CO2 22 - 32 mmol/L _1 Calcium 8.9 - 10.3 mg/dL 9.5 9.2 8.4(L)  Total Protein 6.5 - 8.1 g/dL - - 5.8(L)  Total Bilirubin 0.3 - 1.2 mg/dL - - 1.6(H)  Alkaline Phos 38 - 126 U/L - - 206(H)  AST 15 - 41 U/L - - 75(H)  ALT 0 - 44 U/L - - 104(H)    Imaging studies: No new pertinent imaging studies   Assessment/Plan:  71 y.o. female with left leg necrotizing fasciitis with 24 Day Post-Op s/p debridement, complicated by pertinent comorbidities including strep pyogenes bacteremia now in septic  shock (resolved), respiratory failure mechanical ventilation (resolved), liver failure (resolved), acute renal failure on CRRT, COPD, type 2 diabetes mellitus, hypertension, obstructive sleep apnea, GERD   Left leg necrotizing fasciitis -S/p debridement 05/07/2021 -Second debridement on 05/10/2021.  Application of wound VAC system -S/p myriad matrix placement on 05/17/2021 -Negative pressure dressing changed yesterday.  Working adequately.  There is adequate seal. -I will continue to follow along.  Arnold Long, MD

## 2021-06-01 NOTE — Consult Note (Signed)
Client continues to be on the ventilator, unable to assess, will continue to try.  Waylan Boga, PMHNP

## 2021-06-01 NOTE — Progress Notes (Signed)
Patient is refusing to allow oral care or turning.  Explained and educated patient.  When trying to complete patient swats at nurse.  Explained to family we are trying and the importance of encouraging movement to aid in healing.

## 2021-06-01 NOTE — Progress Notes (Signed)
Effluent line on CRRT machine is leaking x2.  Called [phone] number and was told we had to waste and start over.  Passed on the issue to night shift.  Company wants the lot numbers to reimburse the cost.

## 2021-06-01 NOTE — Progress Notes (Signed)
CRRT stopped blood returned to patient for line replacement.

## 2021-06-01 NOTE — Progress Notes (Signed)
Occupational Therapy Treatment Patient Details Name: Alexandra Foster MRN: [id number] DOB: November 16, 1950 Today's Date: 06/01/2021   History of present illness 71 y/o female with history of Asthma/COPD, T2DM, HTN, OSA on CPAP, GERD, COVID-19 pneumonia, and Bilateral lower extremity edema who presented to the ED with chief complaint of LLE pain.  Cellulitis of LLE, septic shock with LLL necrotizing fascitis post op resp failure and severe septic shock with progressive renal failure on CRRT and progressive multiorgan failure.  Pt needing vent support on trach at night, HD, tube feeds. Pt is now s/p I&D of LLE & wound vac application on 6/38/45. Per secure chat with nephrologist it is okay to see pt while on CRRT. Surgeon also clears pt for participation with WBAT LLE.   OT comments  Pt seen for OT tx this date to f/u re: basic strengthening as it pertains to safety with ADLs/ADL mobility. OT engages pt in grip squeezes with wash cloth rolled up for light resistance, pt is minimally participatory with gentle encouragement from her nephew to follow OT's cues. He is present throughout session. Extended time still spent primarily on trying to convive pt to participate on some level. Pt on vent support this setting. She does endorses some increased sensation to L foot with light touch. Pt left with all needs met and in reach. She continues to be largely resistive, but does actively participate in about ~10% of session. Will continue to follow.   Recommendations for follow up therapy are one component of a multi-disciplinary discharge planning process, led by the attending physician.  Recommendations may be updated based on patient status, additional functional criteria and insurance authorization.    Follow Up Recommendations  OT at [facility]term acute care hospital    Assistance Recommended at Discharge Frequent or constant Supervision/Assistance  Patient can return home with the following  Two people to help  with bathing/dressing/bathroom;Two people to help with walking and/or transfers;Assistance with cooking/housework;Assist for transportation;Help with stairs or ramp for entrance;Assistance with feeding;Direct supervision/assist for financial management;Direct supervision/assist for medications management   Equipment Recommendations  Other (comment)    Recommendations for Other Services      Precautions / Restrictions Precautions Precautions: Fall Precaution Comments: L jugular temp dialysis catheter, wounds under most skin folds, LLE wound vac, ventilator via tracheostomy, rectal tube, catheter Restrictions Weight Bearing Restrictions: Yes LLE Weight Bearing: Weight bearing as tolerated Other Position/Activity Restrictions: per surgeon, no restrictions       Mobility Bed Mobility                    Transfers                         Balance                                           ADL either performed or assessed with clinical judgement   ADL                                              Extremity/Trunk Assessment              Vision       Perception     Praxis  Cognition Arousal/Alertness: Awake/alert Behavior During Therapy: Flat affect, Agitated Overall Cognitive Status: Difficult to assess                                 General Comments: seemingly approporiate with yes/no, she is more responsive to her nephew than any other family member thus far and she does actually actively participate in ~10% of therapy session.        Exercises Other Exercises Other Exercises: OT engages pt in grip squeezes with wash cloth rolled up for light resistance, pt is minimally participatory with gentle encouragement from her nephew to follow OT's cues. He is present throughout session. Extended time still spent primarily on trying to convive pt to participate on some level.    Shoulder Instructions        General Comments      Pertinent Vitals/ Pain       Pain Assessment Pain Assessment: Faces Faces Pain Scale: Hurts even more Pain Location: mouth Pain Descriptors / Indicators: Grimacing, Guarding Pain Intervention(s): Monitored during session  Home Living                                          Prior Functioning/Environment              Frequency  Min 2X/week        Progress Toward Goals  OT Goals(current goals can now be found in the care plan section)  Progress towards OT goals: Progressing toward goals  Acute Rehab OT Goals Patient Stated Goal: none stated OT Goal Formulation: Patient unable to participate in goal setting Potential to Achieve Goals: [place] Discharge plan remains appropriate    Co-evaluation                 AM-PAC OT "6 Clicks" Daily Activity     Outcome Measure   Help from another person eating meals?: A Lot Help from another person taking care of personal grooming?: A Lot Help from another person toileting, which includes using toliet, bedpan, or urinal?: Total Help from another person bathing (including washing, rinsing, drying)?: Total Help from another person to put on and taking off regular upper body clothing?: Total Help from another person to put on and taking off regular lower body clothing?: Total 6 Click Score: 8    End of Session Equipment Utilized During Treatment: Other (comment) (on vent support today.)  OT Visit Diagnosis: Other abnormalities of gait and mobility (R26.89);Muscle weakness (generalized) (M62.81);Pain Pain - Right/Left: Left Pain - part of body: Leg (and mouth)   Activity Tolerance     Patient Left in bed;with call bell/phone within reach;with bed alarm set;with nursing/sitter in room;with family/visitor present   Nurse Communication          Time: 6147-0929 OT Time Calculation (min): 17 min  Charges: OT General Charges $OT Visit: 1 Visit OT  Treatments $Therapeutic Exercise: 8-22 mins  Gerrianne Scale, [place], OTR/L ascom [phone] 06/01/21, 3:08 PM

## 2021-06-01 NOTE — Progress Notes (Signed)
CRRT restarted with new line.  Line placement verified by Donell Beers, NP.

## 2021-06-01 NOTE — Progress Notes (Addendum)
NAME:  Alexandra Foster, MRN:  [id number], DOB:  [birth year], LOS: 37 ADMISSION DATE:  05/04/2021, CONSULTATION DATE:  05/04/2021 REFERRING MD:  Duffy Bruce MD CHIEF COMPLAINT:  Left leg pain   HPI  71 y.o female with medical history significant of Asthma/COPD, T2DM, HTN, OSA on CPAP, GERD, COVID-19 pneumonia, and Bilateral lower extremity edema who presented to the ED with chief complaints of LLE pain and chills since Thursday.  Patient report onset of symptoms for the past several days with worsening pain, swelling, fevers and chills, poor po intake and difficulty walking since Thursday. Denies injury to extremity or hx of cellulitis.  ED Course: In the emergency department, the temperature was 37.7C, the heart rate 103 beats/minute, the blood pressure 75/64  mm Hg, the respiratory rate 27 breaths/minute, and the oxygen saturation 99% on RA. CT of left lower leg showed diffuse subcutaneous edema concerning for cellulitis with no fluid collection/abscess. Pertinent Labs in Red/Diagnostics Findings: Na+/ K+: 141/2.9 Glucose: 144 BUN/Cr.: 44/3.47 AST/ALT:67/48   WBC/ TMAX: 16.5/ afebrile PCT: pending Lactic acid: 3.8>6.0 COVID PCR: Negative  Patient given 30 cc/kg of fluids and started on broad-spectrum antibiotics for sepsis with septic shock. Patient remained hypotensive despite IVF boluses therefore was started on Levophed. PCCM consulted.  Past Medical History    Arthritis   Asthma   COPD (chronic obstructive pulmonary disease) ([place])   Diabetes mellitus without complication ([place])   Endometriosis   GERD (gastroesophageal reflux disease)   Hypertension   Obesity   Sleep apnea    CPAP   Significant Hospital Events   1/21: Admitted to the ICU with severe sepsis with shock secondary to cellulitis of LLE 1/23: off pressors +Afib with RVR 1/24: OR for fasciotomy 1/25: post op resp failure with severe septic shock 1/26: remains on pressors 1/27: returns to the OR today for  debridement, wound VAC 1/28: overnight with hemodynamic deterioration, required reintubation, currently on pressors 1/29: remains on 3 pressors, CRRT, wound VAC and minimal sedation.  Awakens to voice and touch.  Seen by vascular surgery and awaiting input from general surgery regarding amputation of left leg 2/1: remains on pressors, on CRRT, MRI/CT shows SAH, afib with RVR, ECHO pending, restarted AMIO at 60 mg/hr 2/3: remains on crrt, remains on vent, plan for [facility] and wound vac change in OR 2/3: S/P TRACH, omplantation of cellular matrix on left leg 2/4: REMAINS ON PRESSORS, REMAINS ON VENT, REMAINS ON CRRT 2/5: REMAINS ON VENT, OFF PRESSORS, REMAINS ON CRRT 2/6: Patient is improved she is able to follow communication verbally and move extermities. She is on 5L/min Trache collar. We have stopped levophed and working on reducing vasopressin. She remains on CRRT.  Insulin infusion is ongoing and plan to have weight based regimen initiated. Additionally she will have SLP for vocalization and PMV trial as well as ROM training with OT/PT.  Multiple specialists on case appreciate everyone involved.  2/7: patient is improved mildly , she's off CRRT, shes off insulin drip. She is mentating.  She was evaluated by [facility].  She was unable to vocalize with PMV.  She may need PEG tube for nourishment.  2/8: patient is overnight worsened with fever and increased levophed dose.  She is negative 1.7L.   She is on zosyn.  2/9: patient is improved this am. She is giving thumbs up during examination. 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 She seems volume depleted  as well so we will start CVP monitoring and give IV LR 1L bolus today to check for volume responsiveness. Albumin also while on CRRT. PT/OT starting today. Type and screen repeat due to plan for OR in am. On zosyn with ID following. Starting [place] heparin today.   2/10: patient is s/p repeat surgery on RLE. She is on levophed 46mg post surgery. She is not tachycardic and able to follow some verbal communication.  2/11: patient stable overnight. Reordering PT/OT today. Wbc count slightly trending up remains on abx probably post surgical.  Remains severely anemic on CRRT and vasopressor support with tracheostomy.  2/12: patient with severe anemia today.  Reviewed with renal team for blood transfusion today.  2/13: remains critically ill, on pressors and CRRT 2/13: trach changed for a #8 cuffed distal XLT without difficulty. Wound cleaner. PER ENT 2/14: remains on pressors, CRRT, VENT, plan for PEG and WOUND vac change at bedside; received 1 unit of PRBC's due to anemia  2/15: s/p IVC filter placement due to inability to tolerate anticoagulation  2/16: plan for Trach collar trials as tolerated.  Continues on CRRT. Pt refusing nursing care and to work with PT/OT, Palliative Care re-consulted 2/17: Pt seems depressed, refusing nursing care.  Consult Psychiatry. 2/18: Dialysis filter continues to clot requiring filter changes with inability to return blood back.  Hgb 6.4 pt received 1 unit pRBC's.  Increased amiodarone frequency from 200 mg daily to bid due to pt converting in and out of atrial fibrillation with rvr   Consults:  PCCM General Surgery  Nephrology ID  Speech  ENT   Significant Diagnostic Tests:  1/21: Chest Xray> no active cardiopulmonary process 1/21: CT left lower leg>Diffuse subcutaneous edema may represent cellulitis. No drainable fluid collection/abscess 1/21: Ultrasound lower unilateral left> no DVT  Micro Data:  1/21: SARS-CoV-2 PCR>>negative 1/21: Influenza PCR>>negative 1/21: Blood culture x2>>group A strep (strep pyogenes) 1/21: Urine Culture>negative  1/21: MRSA PCR>>negative  1/24: Left leg wound culture>>negative  2/7: TLurline [place] would culture>>few pseudomonas aeruginosa and few candida albicans  2/8: MRSA PCR>>  negative  Antimicrobials:  Vancomycin 1/21>>1/22 Cefepime 1/21>>01/22 Clindamycin 1/22>>1/25 Ceftriaxone 1/21 X1; restarted 1/31>>2/6 Penicillin G 1/22>>1/31 Linezolid 1/25>>1/31 Metronidazole 02/6 x1 dose Unasyn 2/7>>2/7 Zosyn 2/7>>2/17  Subjective:  Pt intermittently converting from nsr to atrial fibrillation with rvr requiring prn 10 mg iv cardizem x2 and 5 mg iv metoprolol this am.  She is currently rate controlled following interventions   OBJECTIVE  Blood pressure (!) 91/56, pulse 88, temperature (!) 97.3 F (36.3 C), temperature source Axillary, resp. rate (!) 24, height '5\' 5"'  (1.651 m), weight 111.4 kg, SpO2 100 %.    Vent Mode: PRVC FiO2 (%):  [30 %-40 %] 30 % Set Rate:  [20 bmp] 20 bmp Vt Set:  [460 mL] 460 mL PEEP:  [5 cmH20] 5 cmH20 Plateau Pressure:  [13 cmH20] 13 cmH20   Intake/Output Summary (Last 24 hours) at 06/01/2021 0842 Last data filed at 06/01/2021 00569Gross per 24 hour  Intake 2837.41 ml  Output 2517 ml  Net 320.41 ml   Filed Weights   05/30/21 0500 05/31/21 0500 06/01/21 0500  Weight: 116.1 kg 111.7 kg 111.4 kg     Physical Examination  General: acute on chronically ill appearing female, NAD resting in bed on vent HENT: supple, no JVD, size 8 XLT tracheostomy present dressing dry/intact  Lungs: diminished on the right rhonchi all other lobes, even, non labored  Cardiovascular: intermittent atrial fibrillation, no M/R/G, 2+ radial/2+  distal pulses Abdomen: +BS x4, obese, soft, non tender, non distended  Extremities: LLE negative pressure dressing present clean dry and intact Neuro: Awake, intermittently follows commands, generalized weakness (but no focal deficits) PERRL.  Affect is very flat GU: deferred anuric  Skin: see below    Labs/imaging that I havepersonally reviewed  (right click and "Reselect all SmartList Selections" daily)  All labs reviewed 05/31/2021   Labs   CBC: Recent Labs  Lab 05/28/21 0439 05/28/21 1127  05/29/21 0422 05/30/21 0430 05/31/21 0306 06/01/21 0423  WBC 13.7*  --  9.8 9.4 10.8* 10.5  NEUTROABS 12.2*  --  8.8* 8.7* 9.9* 9.4*  HGB 6.8* 7.6* 8.4* 8.7* 8.0* 6.4*  HCT 22.1* 24.8* 27.3* 28.1* 27.0* 22.2*  MCV 97.4  --  94.1 94.6 96.8 98.2  PLT 183  --  184 199 173 140*    Basic Metabolic Panel: Recent Labs  Lab 05/28/21 0439 05/28/21 1601 05/29/21 0422 05/29/21 1531 05/30/21 0430 05/30/21 1532 05/31/21 0306 05/31/21 1624 06/01/21 0423  NA 136   < > 137   < > 137 136 137 136 136  K 3.8   < > 4.2   < > 4.5 4.2 5.2* 4.8 4.4  CL 104   < > 105   < > 104 108 104 102 102  CO2 25   < > 26   < > '26 23 26 24 24  ' GLUCOSE 135*   < > 143*   < > 209* 233* 301* 235* 252*  BUN 54*   < > 37*   < > 36* 32* 40* 47* 51*  CREATININE 1.34*   < > 1.33*   < > 1.27* 1.14* 1.45* 1.32* 1.39*  CALCIUM 9.0   < > 9.1   < > 9.0 7.8* 8.4* 9.2 9.5  MG 2.2  --  2.5*  --  2.6*  --  2.5*  --  2.2  PHOS 3.3   < > 3.5   < > 3.1 2.3* 2.9 2.8 2.4*   < > = values in this interval not displayed.   GFR: Estimated Creatinine Clearance: 45.5 mL/min (A) (by C-G formula based on SCr of 1.39 mg/dL (H)). Recent Labs  Lab 05/29/21 0422 05/30/21 0430 05/31/21 0306 06/01/21 0423  WBC 9.8 9.4 10.8* 10.5    Liver Function Tests: Recent Labs  Lab 05/30/21 0430 05/30/21 1532 05/31/21 0306 05/31/21 1624 06/01/21 0423  AST  --   --  75*  --   --   ALT  --   --  104*  --   --   ALKPHOS  --   --  206*  --   --   BILITOT  --   --  1.6*  --   --   PROT  --   --  5.8*  --   --   ALBUMIN 2.7* 2.5* 2.5*   2.4* 2.2* 3.2*   No results for input(s): LIPASE, AMYLASE in the last 168 hours. No results for input(s): AMMONIA in the last 168 hours.  ABG    Component Value Date/Time   PHART 7.40 05/14/2021 0341   PCO2ART 45 05/14/2021 0341   PO2ART 129 (H) 05/14/2021 0341   HCO3 27.9 05/14/2021 0341   ACIDBASEDEF 12.8 (H) 05/11/2021 0603   O2SAT 98.9 05/14/2021 0341     Coagulation Profile: Recent Labs  Lab  05/28/21 1601  INR 1.2    Cardiac Enzymes: No results for input(s): CKTOTAL, CKMB, CKMBINDEX, TROPONINI in the  last 168 hours.  HbA1C: Hgb A1c MFr Bld  Date/Time Value Ref Range Status  05/05/2021 04:53 AM 6.7 (H) 4.8 - 5.6 % Final    Comment:    (NOTE)         Prediabetes: 5.7 - 6.4         Diabetes: >6.4         Glycemic control for adults with diabetes: <7.0   02/11/2021 09:41 AM 6.3 (H) <5.7 % of total Hgb Final    Comment:    For someone without known diabetes, a hemoglobin  A1c value between 5.7% and 6.4% is consistent with prediabetes and should be confirmed with a  follow-up test. . For someone with known diabetes, a value <7% indicates that their diabetes is well controlled. A1c targets should be individualized based on duration of diabetes, age, comorbid conditions, and other considerations. . This assay result is consistent with an increased risk of diabetes. . Currently, no consensus exists regarding use of hemoglobin A1c for diagnosis of diabetes for children. .     CBG: Recent Labs  Lab 05/31/21 1556 05/31/21 2007 05/31/21 2318 06/01/21 0315 06/01/21 0739  GLUCAP 200* 192* 221* 233* 195*    Review of Systems:   Unable to assess pt mechanically ventilated via tracheostomy, pt also refuses to answer questions  Past Medical History  She,  has a past medical history of AKI (acute kidney injury) ([place]), Arthritis, Asthma, Bacteremia, COPD (chronic obstructive pulmonary disease) ([place]), Diabetes mellitus without complication ([place]), Endometriosis, GERD (gastroesophageal reflux disease), History of echocardiogram, Hypertension, Obesity, PAF (paroxysmal atrial fibrillation) ([place]), and Sleep apnea.   Surgical History    Past Surgical History:  Procedure Laterality Date   ABDOMINAL HYSTERECTOMY     APPLICATION OF WOUND VAC  05/10/2021   Procedure: APPLICATION OF WOUND VAC;  Surgeon: Herbert Pun, MD;  Location: [facility];  Service: General;;    APPLICATION OF WOUND VAC Left 05/17/2021   Procedure: APPLICATION OF WOUND VAC/WOUND VAC EXCHANGE-Matrix Myriad;  Surgeon: Herbert Pun, MD;  Location: [facility];  Service: General;  Laterality: Left;   APPLICATION OF WOUND VAC  05/24/2021   Procedure: APPLICATION OF WOUND VAC;  Surgeon: Herbert Pun, MD;  Location: [facility];  Service: General;;   COLONOSCOPY  10/29/2006   Dr Allen Norris   COLONOSCOPY WITH PROPOFOL N/A 11/05/2016   Procedure: COLONOSCOPY WITH PROPOFOL;  Surgeon: Robert Bellow, MD;  Location: [facility];  Service: Endoscopy;  Laterality: N/A;   INCISION AND DRAINAGE OF WOUND Left 05/10/2021   Procedure: IRRIGATION AND DEBRIDEMENT LEFT LEG;  Surgeon: Herbert Pun, MD;  Location: [facility];  Service: General;  Laterality: Left;   INCISION AND DRAINAGE OF WOUND Left 05/24/2021   Procedure: IRRIGATION AND DEBRIDEMENT LEFT LEG;  Surgeon: Herbert Pun, MD;  Location: [facility];  Service: General;  Laterality: Left;   IVC FILTER INSERTION N/A 05/29/2021   Procedure: IVC FILTER INSERTION;  Surgeon: Katha Cabal, MD;  Location: [place] [facility];  Service: Cardiovascular;  Laterality: N/A;   MINOR GRAFT APPLICATION  6/44/0347   Procedure: Myriad Matrix  APPLICATION;  Surgeon: Herbert Pun, MD;  Location: [facility];  Service: General;;   NASAL SINUS SURGERY  2002   Dr Carlis Abbott   PEG PLACEMENT N/A 05/28/2021   Procedure: PERCUTANEOUS ENDOSCOPIC GASTROSTOMY (PEG) PLACEMENT;  Surgeon: Benjamine Sprague, DO;  Location: [facility];  Service: General;  Laterality: N/A;  TRAVEL CASE   TRACHEOSTOMY TUBE PLACEMENT N/A 05/17/2021   Procedure: TRACHEOSTOMY;  Surgeon:  Clyde Canterbury, MD;  Location: [facility];  Service: ENT;  Laterality: N/A;   WOUND DEBRIDEMENT Left 05/07/2021   Procedure: DEBRIDEMENT WOUND;  Surgeon: Herbert Pun, MD;  Location: [facility];  Service: General;  Laterality: Left;     Social History   reports that she quit smoking about 20  years ago. Her smoking use included cigarettes. She has a 80.00 pack-year smoking history. She quit smokeless tobacco use about 20 years ago.  Her smokeless tobacco use included snuff. She reports that she does not drink alcohol and does not use drugs.   Family History   Her family history includes Alcohol abuse in her brother; Breast cancer (age of onset: 13) in her sister; Congestive Heart Failure in her mother; Coronary artery disease (age of onset: 46) in her father; Heart disease in her brother; Varicose Veins in her brother.   Allergies No Known Allergies   Home Medications  Prior to Admission medications   Medication Sig Start Date End Date Taking? Authorizing Provider  albuterol (PROVENTIL) (2.5 MG/3ML) 0.083% nebulizer solution Take 3 mLs (2.5 mg total) by nebulization every 6 (six) hours as needed for wheezing or shortness of breath. 10/03/19   Steele Sizer, MD  aspirin 81 MG tablet Take 1 tablet (81 mg total) by mouth daily. 01/08/16   Steele Sizer, MD  atorvastatin (LIPITOR) 40 MG tablet Take 1 tablet (40 mg total) by mouth daily. 02/11/21   Steele Sizer, MD  azelastine (ASTELIN) 0.1 % nasal spray Place 2 sprays into both nostrils 2 (two) times daily as needed for rhinitis or allergies. 07/31/20   Delsa Grana, PA-C  baclofen (LIORESAL) 10 MG tablet Take 10 mg by mouth daily. 04/16/21   [provider]  calcium carbonate (OSCAL) 1500 (600 Ca) MG TABS tablet Take 600 mg of elemental calcium by mouth 2 (two) times daily with a meal.    [provider]  celecoxib (CELEBREX) 200 MG capsule Take 200 mg by mouth daily. 04/03/21   [provider]  Cetirizine HCl (ZYRTEC ALLERGY) 10 MG TBDP Take 10 mg by mouth at bedtime. For sinus symptoms and seasonal allergies 07/31/20   Delsa Grana, PA-C  docusate sodium (COLACE) 100 MG capsule Take 100 mg by mouth daily.    [provider]  fluticasone Asencion Islam) 50 MCG/ACT nasal spray  08/31/20   [provider]  Fluticasone-Umeclidin-Vilant (TRELEGY ELLIPTA) 100-62.5-25 MCG/INH AEPB Inhale 1 puff into the lungs daily. In place of Stiolto 02/10/20   Steele Sizer, MD  glucose blood (ACCU-CHEK AVIVA PLUS) test strip Use as instructed 12/08/17   Steele Sizer, MD  Lancets (ACCU-CHEK SOFT TOUCH) lancets Use as instructed 12/08/17   Steele Sizer, MD  loratadine (CLARITIN) 10 MG tablet Take 1 tablet (10 mg total) by mouth daily. 02/11/21   Steele Sizer, MD  meloxicam (MOBIC) 15 MG tablet Take 15 mg by mouth daily. 04/23/21   [provider]  metFORMIN (GLUCOPHAGE-XR) 750 MG 24 hr tablet Take 1 tablet (750 mg total) by mouth daily with breakfast. 02/11/21   Ancil Boozer, Drue Stager, MD  montelukast (SINGULAIR) 10 MG tablet Take 1 tablet (10 mg total) by mouth at bedtime. 02/11/21   Steele Sizer, MD  Multiple Vitamins-Minerals (MULTIVITAL PO) Take 1 tablet by mouth daily.    [provider]  omeprazole (PRILOSEC) 20 MG capsule Take 1 capsule (20 mg total) by mouth daily. 02/11/21   Steele Sizer, MD  [place] 30 MG/2ML SOSY  10/09/20   [provider]  triamterene-hydrochlorothiazide (  DYAZIDE) 37.5-25 MG capsule TAKE 1 CAPSULE EVERY DAY 05/02/21   Steele Sizer, MD  vitamin B-12 (CYANOCOBALAMIN) 100 MCG tablet Take 100 mcg by mouth daily.    [provider]  Scheduled Meds:  sodium chloride   Intravenous Once   amiodarone  200 mg Per Tube BID   vitamin C  500 mg Per Tube BID   chlorhexidine gluconate (MEDLINE KIT)  15 mL Mouth Rinse BID   Chlorhexidine Gluconate Cloth  6 each Topical Q0600   Darbepoetin Alfa  100 mcg Subcutaneous Q7 days   escitalopram  10 mg Per Tube Daily   free water  30 mL Per Tube Q4H   gentamicin irrigation  80 mg Irrigation QID   hydrocortisone sod succinate (SOLU-CORTEF) inj  100 mg Intravenous Q12H   insulin aspart  0-20 Units Subcutaneous Q4H   insulin aspart  6 Units Subcutaneous Q4H   insulin detemir  23 Units Subcutaneous  BID   ipratropium-albuterol  3 mL Nebulization Q6H   magic mouthwash w/lidocaine  10 mL Oral QID   mouth rinse  15 mL Mouth Rinse 10 times per day   midodrine  10 mg Per Tube TID WC   multivitamin  1 tablet Per Tube QHS   nutrition supplement (JUVEN)  1 packet Per Tube BID BM   oxyCODONE  5 mg Per Tube Q6H   pantoprazole (PROTONIX) IV  40 mg Intravenous Q24H   sodium chloride flush  10-40 mL Intracatheter Q12H   vitamin A  10,000 Units Per Tube Daily   zinc sulfate  220 mg Per Tube Daily   Continuous Infusions:  albumin human Stopped (06/01/21 0338)   Followed by   Derrill Memo ON 06/02/2021] albumin human     feeding supplement (PIVOT 1.5 CAL) 1,000 mL (05/31/21 0652)   norepinephrine (LEVOPHED) Adult infusion 12 mcg/min (06/01/21 0800)   prismasol BGK 0/2.5     prismasol BGK 2/2.5 dialysis solution 1,000 mL/hr at 06/01/21 0330   prismasol BGK 2/2.5 replacement solution 500 mL/hr at 06/01/21 0000   prismasol BGK 2/2.5 replacement solution 300 mL/hr at 06/01/21 0416   PRN Meds:.acetaminophen, albuterol, artificial tears, clonazePAM, docusate, heparin, HYDROmorphone (DILAUDID) injection, lip balm, ondansetron (ZOFRAN) IV, oxyCODONE, sennosides, sodium chloride, sodium chloride flush  Assessment & Plan:   Ventilator Dependent Respiratory Failure s/p tracheostomy  ATELECTASIS, CHF PULMONARY EDEMA AND PLEURAL EFFUSIONS ALL PRESENT ON ADMISSION Hx: COPD, OSA, and Obesity - Trach collar trials as tolerated with mechanical ventilation qhs for now  - Wean Fio2 and PEEP as tolerated - VAP/VENT bundle implementation - Wean PEEP & FiO2 as tolerated, maintain SpO2 >88% - VAP protocol in place - Plateau pressures less than 30 cm H20  - Intermittent chest x-ray & ABG PRN - Ensure adequate pulmonary hygiene   - Per speech therapy trach will need to be downsized to size 6 when pt able to tolerate to be considered for PMV therapy    Septic shock Intermittent atrial fibrillation with rvr  -  Continuous cardiac monitoring - Maintain MAP >65 - Vasopressors as needed to maintain MAP goal - Continue scheduled midodrine   - Continue stress dose steroids due to concerns of adrenal insufficiency   - Will increase amiodarone frequency from 200 mg daily to bid   Severe Sepsis SOURCE-left necrotizing fascitis (strep pyogenes)-present on admission s/p multiple debridements, application of wound VAC system, s/p myriad matrix placement 05/17/2021 Streptococcal toxic shock syndrome Pseudomonas in Tracheal wound culture (? respiratory secretions coating the tracheostomy) - Monitor  fever curve - Trend WBC's  - Follow cultures as above - Completed full course of abx therapy~02/17  - General Surgery following, appreciate input ~ wound care as per Surgery  Acute kidney injury secondary to ischemic nephropathy in the setting of hypotension complicated with sepsis  - Avoid nephrotoxic agents - Follow urine output, BMP - Ensure adequate renal perfusion, optimize oxygenation - Renal dose medications - Nephrology consulted appreciate input~per recommendations continue CRRT with net even ultrafiltration    Shock liver   - Monitor hepatic function panel   - Monitor coags   Acute on chronic anemia~pt unable to tolerate chemical anticoagulation s/p IVC filter placement 05/29/2021 -Monitor for S/Sx of bleeding -Trend CBC -Unable to tolerate anticoagulants or SCD's for VTE Prophylaxis ~ IVC filter placed 2/15 -Transfuse for Hgb <7  Type II diabetes mellitus  -CBG's q4h; Target range of 140 to 180 -SSI -Follow ICU Hypo/Hyperglycemia protocol    Toxic metabolic encephalopathy~improving  Small SAH  Appears Depressed NO ANTICOAGULATION FOR  1 MONTH AND THEN NEED TO RE-ASSESS NO ANTIPLATELET AGENTS FOR 1 MONTH AND THEN NEED TO RE-ASSESS CAN POSSIBLY CONSIDER BABY ASA BUT WOULD NEED TO PROCEED WITH CAUTION -Continue Lexapro -Psychiatry consulted, appreciate input  Best practice:  Diet: Tube  Feeds  Pain/Anxiety/Delirium protocol (if indicated): scheduled and prn oxycodone  VAP protocol (if indicated): Ordered  DVT prophylaxis: Unable to tolerate chemical prophylaxis s/p IVC filter placement  GI prophylaxis: PPI Glucose control:  SSI Yes Central venous access: Right IJ CVL; Left IJ Dual Lumen Dialysis Catheter Arterial line:  N/A Foley:  N/A Mobility:  bed rest  PT consulted: N/A Last date of multidisciplinary goals of care discussion [05/31/21] Code Status:  full code Disposition: ICU   = Goals of Care = Code Status Order: ICU  Primary Emergency ContactLAURETTE, VILLESCAS, Home Phone: [phone] Will update family once they arrive at bedside   Critical care time: 35 minutes    Rosilyn Mings, [place] Pager [phone] (please enter 7 digits) PCCM Consult Pager [phone] (please enter 7 digits)

## 2021-06-01 NOTE — Progress Notes (Signed)
[facility]  PROGRESS NOTE   Subjective:   Patient remains critically ill at the moment.  Still on pressors, vent support, CRRT.  Family at bedside today as well.  UF continues with CRRT.     02/17 0701 - 02/18 0700 In: 2227.8 [I.V.:156.8; YK/DX:8338; IV Piggyback:298.1] Out: 2252    Objective:  Vital signs in last 24 hours:  Temp:  [97.3 F (36.3 C)-99.8 F (37.7 C)] 99.8 F (37.7 C) (02/18 1200) Pulse Rate:  [65-146] 146 (02/18 1300) Resp:  [14-24] 18 (02/18 1300) BP: (91-139)/(44-75) 107/63 (02/18 1300) SpO2:  [99 %-100 %] 99 % (02/18 1350) FiO2 (%):  [30 %-35 %] 30 % (02/18 1350) Weight:  [111.4 kg] 111.4 kg (02/18 0500)  Weight change: -0.3 kg Filed Weights   05/30/21 0500 05/31/21 0500 06/01/21 0500  Weight: 116.1 kg 111.7 kg 111.4 kg    Intake/Output: I/O last 3 completed shifts: In: 3217.3 [I.V.:280.8; Other:270; SN/KN:3976; IV Piggyback:383.5] Out: 7341 [Drains:75; Other:3121]   Intake/Output this shift:  Total I/O In: 986 [I.V.:119.1; Blood:353.3; NG/GT:390; IV Piggyback:123.6] Out: 525 [Drains:50; Other:475]  Physical Exam: General:  Critically ill  Head:  [place]/AT, some bleeding noted around the lips  Eyes:  Anicteric  Neck:  tracheostomy in place  Lungs:   diminished bilaterally  Heart:  regular  Abdomen:   Soft, nontender  Extremities:  Left lower extremity wound vac  Neurologic:  Awake, agitated  Skin:  No lesions  Access:  Left IJ temp HD catheter    Basic Metabolic Panel: Recent Labs  Lab 05/28/21 0439 05/28/21 1601 05/29/21 0422 05/29/21 1531 05/30/21 0430 05/30/21 1532 05/31/21 0306 05/31/21 1624 06/01/21 0423  NA 136   < > 137   < > 137 136 137 136 136  K 3.8   < > 4.2   < > 4.5 4.2 5.2* 4.8 4.4  CL 104   < > 105   < > 104 108 104 102 102  CO2 25   < > 26   < > '26 23 26 24 24  ' GLUCOSE 135*   < > 143*   < > 209* 233* 301* 235* 252*  BUN 54*   < > 37*   < > 36* 32* 40* 47* 51*  CREATININE 1.34*   < > 1.33*   < >  1.27* 1.14* 1.45* 1.32* 1.39*  CALCIUM 9.0   < > 9.1   < > 9.0 7.8* 8.4* 9.2 9.5  MG 2.2  --  2.5*  --  2.6*  --  2.5*  --  2.2  PHOS 3.3   < > 3.5   < > 3.1 2.3* 2.9 2.8 2.4*   < > = values in this interval not displayed.     CBC: Recent Labs  Lab 05/28/21 0439 05/28/21 1127 05/29/21 0422 05/30/21 0430 05/31/21 0306 06/01/21 0423 06/01/21 1013  WBC 13.7*  --  9.8 9.4 10.8* 10.5  --   NEUTROABS 12.2*  --  8.8* 8.7* 9.9* 9.4*  --   HGB 6.8*   < > 8.4* 8.7* 8.0* 6.4* 7.5*  HCT 22.1*   < > 27.3* 28.1* 27.0* 22.2* 25.1*  MCV 97.4  --  94.1 94.6 96.8 98.2  --   PLT 183  --  184 199 173 140*  --    < > = values in this interval not displayed.      Urinalysis: No results for input(s): COLORURINE, LABSPEC, [place], [place], [place], [place], [place], [place], [place], NITRITE, LEUKOCYTESUR  in the last 72 hours.  Invalid input(s): APPERANCEUR    Imaging: DG Chest Port 1 View  Result Date: 05/31/2021 CLINICAL DATA:  Acute respiratory failure with hypoxia. EXAM: PORTABLE CHEST 1 VIEW COMPARISON:  May 23, 2021. FINDINGS: Stable cardiomediastinal silhouette. Tracheostomy tube is unchanged in position. Bilateral jugular catheters are unchanged in position. Increased left basilar opacity is noted concerning for atelectasis or infiltrate. Minimal right basilar subsegmental atelectasis is noted. Bony thorax is unremarkable. IMPRESSION: Increased left basilar opacity is noted concerning for atelectasis or infiltrate. Minimal right basilar subsegmental atelectasis. Electronically Signed   By: Marijo Conception M.D.   On: 05/31/2021 06:40     Medications:    albumin human Stopped (06/01/21 1114)   Followed by   Derrill Memo ON 06/02/2021] albumin human     feeding supplement (PIVOT 1.5 CAL) 1,000 mL (05/31/21 2446)   norepinephrine (LEVOPHED) Adult infusion 10 mcg/min (06/01/21 1300)   prismasol BGK 0/2.5     prismasol BGK 2/2.5 dialysis solution 1,000 mL/hr at 06/01/21 0850    prismasol BGK 2/2.5 replacement solution 500 mL/hr at 06/01/21 2863   prismasol BGK 2/2.5 replacement solution 300 mL/hr at 06/01/21 0416    sodium chloride   Intravenous Once   amiodarone  200 mg Per Tube BID   vitamin C  500 mg Per Tube BID   chlorhexidine gluconate (MEDLINE KIT)  15 mL Mouth Rinse BID   Chlorhexidine Gluconate Cloth  6 each Topical Q0600   Darbepoetin Alfa  100 mcg Subcutaneous Q7 days   escitalopram  10 mg Per Tube Daily   free water  30 mL Per Tube Q4H   gentamicin irrigation  80 mg Irrigation QID   hydrocortisone sod succinate (SOLU-CORTEF) inj  100 mg Intravenous BID   insulin aspart  0-20 Units Subcutaneous Q4H   insulin aspart  8 Units Subcutaneous Q4H   insulin detemir  23 Units Subcutaneous BID   ipratropium-albuterol  3 mL Nebulization Q6H   magic mouthwash w/lidocaine  10 mL Oral QID   mouth rinse  15 mL Mouth Rinse 10 times per day   midazolam  1 mg Intravenous Once   midodrine  15 mg Per Tube TID WC   multivitamin  1 tablet Per Tube QHS   nutrition supplement (JUVEN)  1 packet Per Tube BID BM   oxyCODONE  5 mg Per Tube Q6H   pantoprazole (PROTONIX) IV  40 mg Intravenous Q24H   sodium chloride flush  10-40 mL Intracatheter Q12H   vitamin A  10,000 Units Per Tube Daily   zinc sulfate  220 mg Per Tube Daily    Assessment/ Plan:     Principal Problem:   Severe sepsis with septic shock (CODE) (HCC) Active Problems:   COPD (chronic obstructive pulmonary disease) (HCC)   Morbid obesity ([place])   Diabetes mellitus type 2 in obese (HCC)   Atrial fibrillation and flutter ([place])   Necrotizing fasciitis ([place])   Acute on chronic respiratory failure ([place])   On mechanically assisted ventilation (HCC)   Shock liver   Anemia associated with acute blood loss   Metabolic acidosis   Encounter for continuous renal replacement therapy (CRRT) for acute renal failure (HCC)   Acute encephalopathy   Group A streptococcal infection   Streptococcal toxic shock  syndrome ([place])   Status post tracheostomy ([place])   Subdural hemorrhage ([place])   Left leg cellulitis  [name] is a 71 y.o. black female with COPD/asthma, diabetes mellitus type II,  hypertension, obstructive sleep apnea, paroxysmal atrial fibrillation, GERD, who presents to [facility] on 05/04/2021 for AKI (acute kidney injury) ([place]) [N17.9] Left leg cellulitis [L03.116] Severe sepsis ([place]) [A41.9, R65.20] Severe sepsis with lactic acidosis ([place]) [A41.9, R65.20, E87.20] Found to have necrotizing fascitis requiring multiple debridements by Dr. Peyton Najjar: 1/24. 1/27, 2/3 and 2/10.    #1: Acute kidney injury: AKI is secondary to ischemic nephropathy secondary to hypotension complicated with sepsis. History of bland urine. Baseline creatinine of 0.98, GFR > 60 on 02/11/21.  -Patient remains anuric at the moment.  Continues to be critically ill.  As such we will continue CRRT at this time.  Still too unstable to perform intermittent hemodialysis.    #2: Sepsis and hypotension: necrotizing fasciitis for left lower extremity status post multiple debridement.  Requiring vasopressors: norepinephrine. On stress dose steroids and midodrine -Continue pressor and antibiotic support.  #3. Anemia with Acute kidney injury:  Lab Results  Component Value Date   HGB 7.5 (L) 06/01/2021    S/p blood transfusions this admission Aranesp weekly SQ (mon)  #4. Acute Respiratory Failure.  Continue ventilatory support.     LOS: 41 Alexandra Foster, [facility] 2/18/20232:11 PM

## 2021-06-01 NOTE — Progress Notes (Addendum)
PT Cancellation Note  Patient Details Name: Alexandra Foster MRN: [id number] DOB: 29-Jun-1950   Cancelled Treatment:     New PT orders received. Spoke with medical team (MD & NP) re: pt's limited to no participation in therapy over this past week with and without family being present during sessions. Pt has indicated she would participate in therapy if her nephew was there. Spoke with rehab supervisor and at this time will d/c current PT orders as OT is still following pt. If pt does in fact demonstrate improvement in motivation & willingness to participate with nephew present please re-consult PT at that time. Thank you.  Lavone Nian, PT, DPT 06/01/21, 12:02 PM   [name] 06/01/2021, 11:59 AM

## 2021-06-01 NOTE — Procedures (Signed)
Arterial Catheter Insertion Procedure Note  Alexandra Foster  [id number]  10-20-1950  Date:06/01/21  Time:3:12 PM    Provider Performing: Teressa Lower    Procedure: Insertion of Arterial Line [phone]) with US guidance ([id number])   Indication(s) Blood pressure monitoring and/or need for frequent ABGs  Consent Risks of the procedure as well as the alternatives and risks of each were explained to the patient and/or caregiver.  Consent for the procedure was obtained and is signed in the bedside chart  Anesthesia Versed and Fentanyl   Time Out Verified patient identification, verified procedure, site/side was marked, verified correct patient position, special equipment/implants available, medications/allergies/relevant history reviewed, required imaging and test results available.   Sterile Technique Maximal sterile technique including full sterile barrier drape, hand hygiene, sterile gown, sterile gloves, mask, hair covering, sterile ultrasound probe cover (if used).   Procedure Description Area of catheter insertion was cleaned with chlorhexidine and draped in sterile fashion. With real-time ultrasound guidance an arterial catheter was placed into the right radial artery.  Appropriate arterial tracings confirmed on monitor.     Complications/Tolerance None; patient tolerated the procedure well.   EBL Minimal   Specimen(s) None  Donell Beers, AGNP  Pulmonary/Critical Care Pager [phone] (please enter 7 digits) PCCM Consult Pager [phone] (please enter 7 digits)

## 2021-06-02 DIAGNOSIS — A419 Sepsis, unspecified organism: Secondary | ICD-10-CM | POA: Diagnosis not present

## 2021-06-02 LAB — GLUCOSE, CAPILLARY
Glucose-Capillary: 178 mg/dL — ABNORMAL HIGH (ref 70–99)
Glucose-Capillary: 179 mg/dL — ABNORMAL HIGH (ref 70–99)
Glucose-Capillary: 161 mg/dL — ABNORMAL HIGH (ref 70–99)
Glucose-Capillary: 157 mg/dL — ABNORMAL HIGH (ref 70–99)
Glucose-Capillary: 148 mg/dL — ABNORMAL HIGH (ref 70–99)

## 2021-06-02 LAB — CBC WITH DIFFERENTIAL/PLATELET
Abs Immature Granulocytes: 0.1 10*3/uL — ABNORMAL HIGH (ref 0.00–0.07)
Basophils Absolute: 0 10*3/uL (ref 0.0–0.1)
Basophils Relative: 0 %
Eosinophils Absolute: 0 10*3/uL (ref 0.0–0.5)
Eosinophils Relative: 0 %
HCT: 22.9 % — ABNORMAL LOW (ref 36.0–46.0)
Hemoglobin: 6.9 g/dL — ABNORMAL LOW (ref 12.0–15.0)
Immature Granulocytes: 1 %
Lymphocytes Relative: 4 %
Lymphs Abs: 0.4 10*3/uL — ABNORMAL LOW (ref 0.7–4.0)
MCH: 28.6 pg (ref 26.0–34.0)
MCHC: 30.1 g/dL (ref 30.0–36.0)
MCV: 95 fL (ref 80.0–100.0)
Monocytes Absolute: 0.5 10*3/uL (ref 0.1–1.0)
Monocytes Relative: 5 %
Neutro Abs: 8.3 10*3/uL — ABNORMAL HIGH (ref 1.7–7.7)
Neutrophils Relative %: 90 %
Platelets: 146 10*3/uL — ABNORMAL LOW (ref 150–400)
RBC: 2.41 MIL/uL — ABNORMAL LOW (ref 3.87–5.11)
RDW: 18.9 % — ABNORMAL HIGH (ref 11.5–15.5)
WBC: 9.7 10*3/uL (ref 4.0–10.5)
nRBC: 1.3 % — ABNORMAL HIGH (ref 0.0–0.2)

## 2021-06-02 LAB — RENAL FUNCTION PANEL
Albumin: 3.2 g/dL — ABNORMAL LOW (ref 3.5–5.0)
Anion gap: 8 (ref 5–15)
BUN: 56 mg/dL — ABNORMAL HIGH (ref 8–23)
CO2: 24 mmol/L (ref 22–32)
Calcium: 9.3 mg/dL (ref 8.9–10.3)
Chloride: 101 mmol/L (ref 98–111)
Creatinine, Ser: 1.31 mg/dL — ABNORMAL HIGH (ref 0.44–1.00)
GFR, Estimated: 43 mL/min — ABNORMAL LOW (ref >60)
Glucose, Bld: 193 mg/dL — ABNORMAL HIGH (ref 70–99)
Phosphorus: 2.6 mg/dL (ref 2.5–4.6)
Potassium: 4.2 mmol/L (ref 3.5–5.1)
Sodium: 133 mmol/L — ABNORMAL LOW (ref 135–145)

## 2021-06-02 LAB — LACTIC ACID, PLASMA
Lactic Acid, Venous: 3.9 mmol/L (ref 0.5–1.9)
Lactic Acid, Venous: 3 mmol/L (ref 0.5–1.9)

## 2021-06-02 LAB — MAGNESIUM: Magnesium: 2.1 mg/dL (ref 1.7–2.4)

## 2021-06-02 LAB — PREPARE RBC (CROSSMATCH)

## 2021-06-02 MED ORDER — SODIUM CHLORIDE 0.9% IV SOLUTION: Freq: Once | INTRAVENOUS | Status: DC

## 2021-06-02 MED ORDER — LACTATED RINGERS IV BOLUS
1000.0000 mL | Freq: Once | INTRAVENOUS | Status: AC
  Administered 2021-06-02: 1000 mL via INTRAVENOUS

## 2021-06-02 MED ORDER — LACTATED RINGERS IV BOLUS
500.0000 mL | Freq: Once | INTRAVENOUS | Status: AC
  Administered 2021-06-02: 500 mL via INTRAVENOUS

## 2021-06-02 MED ORDER — VITAMIN A 15 MG/ML IM SOLN
10000.0000 [IU] | Freq: Every day | INTRAMUSCULAR | Status: DC
  Administered 2021-06-02 – 2021-06-04 (×2): 10000 [IU] via INTRAMUSCULAR
  Filled 2021-06-02 (×6): qty 2

## 2021-06-02 MED ORDER — LACTATED RINGERS IV BOLUS: 1000.0000 mL | Freq: Once | INTRAVENOUS | Status: AC

## 2021-06-02 MED ORDER — SODIUM CHLORIDE 0.9 % IV SOLN: INTRAVENOUS | Status: DC | PRN

## 2021-06-02 NOTE — Progress Notes (Signed)
NAME:  Alexandra Foster, MRN:  [id number], DOB:  [birth year], LOS: 20 ADMISSION DATE:  05/04/2021, CONSULTATION DATE:  05/04/2021 REFERRING MD:  Duffy Bruce MD CHIEF COMPLAINT:  Left leg pain   HPI  71 y.o female with medical history significant of Asthma/COPD, T2DM, HTN, OSA on CPAP, GERD, COVID-19 pneumonia, and Bilateral lower extremity edema who presented to the ED with chief complaints of LLE pain and chills since Thursday.  Patient report onset of symptoms for the past several days with worsening pain, swelling, fevers and chills, poor po intake and difficulty walking since Thursday. Denies injury to extremity or hx of cellulitis.  ED Course: In the emergency department, the temperature was 37.7C, the heart rate 103 beats/minute, the blood pressure 75/64  mm Hg, the respiratory rate 27 breaths/minute, and the oxygen saturation 99% on RA. CT of left lower leg showed diffuse subcutaneous edema concerning for cellulitis with no fluid collection/abscess. Pertinent Labs in Red/Diagnostics Findings: Na+/ K+: 141/2.9 Glucose: 144 BUN/Cr.: 44/3.47 AST/ALT:67/48   WBC/ TMAX: 16.5/ afebrile PCT: pending Lactic acid: 3.8>6.0 COVID PCR: Negative  Patient given 30 cc/kg of fluids and started on broad-spectrum antibiotics for sepsis with septic shock. Patient remained hypotensive despite IVF boluses therefore was started on Levophed. PCCM consulted.  Past Medical History    Arthritis   Asthma   COPD (chronic obstructive pulmonary disease) ([place])   Diabetes mellitus without complication ([place])   Endometriosis   GERD (gastroesophageal reflux disease)   Hypertension   Obesity   Sleep apnea    CPAP   Significant Hospital Events   1/21: Admitted to the ICU with severe sepsis with shock secondary to cellulitis of LLE 1/23: off pressors +Afib with RVR 1/24: OR for fasciotomy 1/25: post op resp failure with severe septic shock 1/26: remains on pressors 1/27: returns to the OR today for  debridement, wound VAC 1/28: overnight with hemodynamic deterioration, required reintubation, currently on pressors 1/29: remains on 3 pressors, CRRT, wound VAC and minimal sedation.  Awakens to voice and touch.  Seen by vascular surgery and awaiting input from general surgery regarding amputation of left leg 2/1: remains on pressors, on CRRT, MRI/CT shows SAH, afib with RVR, ECHO pending, restarted AMIO at 60 mg/hr 2/3: remains on crrt, remains on vent, plan for [facility] and wound vac change in OR 2/3: S/P TRACH, omplantation of cellular matrix on left leg 2/4: REMAINS ON PRESSORS, REMAINS ON VENT, REMAINS ON CRRT 2/5: REMAINS ON VENT, OFF PRESSORS, REMAINS ON CRRT 2/6: Patient is improved she is able to follow communication verbally and move extermities. She is on 5L/min Trache collar. We have stopped levophed and working on reducing vasopressin. She remains on CRRT.  Insulin infusion is ongoing and plan to have weight based regimen initiated. Additionally she will have SLP for vocalization and PMV trial as well as ROM training with OT/PT.  Multiple specialists on case appreciate everyone involved.  2/7: patient is improved mildly , she's off CRRT, shes off insulin drip. She is mentating.  She was evaluated by [facility].  She was unable to vocalize with PMV.  She may need PEG tube for nourishment.  2/8: patient is overnight worsened with fever and increased levophed dose.  She is negative 1.7L.   She is on zosyn.  2/9: patient is improved this am. She is giving thumbs up during examination. 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 She seems volume  ° °NAME:  Alexandra Foster, MRN:  [id number], DOB:  03/06/1951, LOS: 29 °ADMISSION DATE:  05/04/2021, CONSULTATION DATE:  05/04/2021 °REFERRING MD:  Isaacs, Cameron MD CHIEF COMPLAINT:  Left leg pain  ° °HPI  °70 y.o female with medical history significant of Asthma/COPD, T2DM, HTN, OSA on CPAP, GERD, COVID-19 pneumonia, and Bilateral lower extremity edema who presented to the ED with chief complaints of LLE pain and chills since Thursday. ° °Patient report onset of symptoms for the past several days with worsening pain, swelling, fevers and chills, poor po intake and difficulty walking since Thursday. Denies injury to extremity or hx of cellulitis. ° °ED Course: In the emergency department, the temperature was 37.7°C, the heart rate 103 beats/minute, the blood pressure 75/64  mm Hg, the respiratory rate 27 breaths/minute, and the oxygen saturation 99% on RA. CT of left lower leg showed diffuse subcutaneous edema concerning for cellulitis with no fluid collection/abscess. °Pertinent Labs in Red/Diagnostics Findings: °Na+/ K+: 141/2.9 °Glucose: 144 °BUN/Cr.: 44/3.47 °AST/ALT:67/48 °  °WBC/ TMAX: 16.5/ afebrile °PCT: pending °Lactic acid: 3.8>6.0 °COVID PCR: Negative ° °Patient given 30 cc/kg of fluids and started on broad-spectrum antibiotics for sepsis with septic shock. Patient remained hypotensive despite IVF boluses therefore was started on Levophed. PCCM consulted. ° °Past Medical History  ° ° Arthritis  ° Asthma  ° COPD (chronic obstructive pulmonary disease) ([facility])  ° Diabetes mellitus without complication ([facility])  ° Endometriosis  ° GERD (gastroesophageal reflux disease)  ° Hypertension  ° Obesity  ° Sleep apnea  °  CPAP  ° °Significant Hospital Events   °1/21: Admitted to the ICU with severe sepsis with shock secondary to cellulitis of LLE °1/23: off pressors +Afib with RVR °1/24: OR for fasciotomy °1/25: post op resp failure with severe septic shock °1/26: remains on pressors °1/27: returns to the OR today for  debridement, wound VAC °1/28: overnight with hemodynamic deterioration, required reintubation, currently on pressors °1/29: remains on 3 pressors, CRRT, wound VAC and minimal sedation.  Awakens to voice and touch.  Seen by vascular surgery and awaiting input from general surgery regarding amputation of left leg °2/1: remains on pressors, on CRRT, MRI/CT shows SAH, afib with RVR, ECHO pending, restarted AMIO at 60 mg/hr °2/3: remains on crrt, remains on vent, plan for TRACH and wound vac change in OR °2/3: S/P TRACH, omplantation of cellular matrix on left leg °2/4: REMAINS ON PRESSORS, REMAINS ON VENT, REMAINS ON CRRT °2/5: REMAINS ON VENT, OFF PRESSORS, REMAINS ON CRRT °2/6: Patient is improved she is able to follow communication verbally and move extermities. She is on 5L/min Trache collar. We have stopped levophed and working on reducing vasopressin. She remains on CRRT.  Insulin infusion is ongoing and plan to have weight based regimen initiated. Additionally she will have SLP for vocalization and PMV trial as well as ROM training with OT/PT.  Multiple specialists on case appreciate everyone involved.  °2/7: patient is improved mildly , she's off CRRT, shes off insulin drip. She is mentating.  She was evaluated by [facility].  She was unable to vocalize with PMV.  She may need PEG tube for nourishment.  °2/8: patient is overnight worsened with fever and increased levophed dose.  She is negative 1.7L.   She is on zosyn.  °2/9: patient is improved this am. She is giving thumbs up during examination. 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 She seems volume depleted   NAME:  Alexandra Foster, MRN:  [id number], DOB:  [birth year], LOS: 20 ADMISSION DATE:  05/04/2021, CONSULTATION DATE:  05/04/2021 REFERRING MD:  Duffy Bruce MD CHIEF COMPLAINT:  Left leg pain   HPI  71 y.o female with medical history significant of Asthma/COPD, T2DM, HTN, OSA on CPAP, GERD, COVID-19 pneumonia, and Bilateral lower extremity edema who presented to the ED with chief complaints of LLE pain and chills since Thursday.  Patient report onset of symptoms for the past several days with worsening pain, swelling, fevers and chills, poor po intake and difficulty walking since Thursday. Denies injury to extremity or hx of cellulitis.  ED Course: In the emergency department, the temperature was 37.7C, the heart rate 103 beats/minute, the blood pressure 75/64  mm Hg, the respiratory rate 27 breaths/minute, and the oxygen saturation 99% on RA. CT of left lower leg showed diffuse subcutaneous edema concerning for cellulitis with no fluid collection/abscess. Pertinent Labs in Red/Diagnostics Findings: Na+/ K+: 141/2.9 Glucose: 144 BUN/Cr.: 44/3.47 AST/ALT:67/48   WBC/ TMAX: 16.5/ afebrile PCT: pending Lactic acid: 3.8>6.0 COVID PCR: Negative  Patient given 30 cc/kg of fluids and started on broad-spectrum antibiotics for sepsis with septic shock. Patient remained hypotensive despite IVF boluses therefore was started on Levophed. PCCM consulted.  Past Medical History    Arthritis   Asthma   COPD (chronic obstructive pulmonary disease) ([place])   Diabetes mellitus without complication ([place])   Endometriosis   GERD (gastroesophageal reflux disease)   Hypertension   Obesity   Sleep apnea    CPAP   Significant Hospital Events   1/21: Admitted to the ICU with severe sepsis with shock secondary to cellulitis of LLE 1/23: off pressors +Afib with RVR 1/24: OR for fasciotomy 1/25: post op resp failure with severe septic shock 1/26: remains on pressors 1/27: returns to the OR today for  debridement, wound VAC 1/28: overnight with hemodynamic deterioration, required reintubation, currently on pressors 1/29: remains on 3 pressors, CRRT, wound VAC and minimal sedation.  Awakens to voice and touch.  Seen by vascular surgery and awaiting input from general surgery regarding amputation of left leg 2/1: remains on pressors, on CRRT, MRI/CT shows SAH, afib with RVR, ECHO pending, restarted AMIO at 60 mg/hr 2/3: remains on crrt, remains on vent, plan for [facility] and wound vac change in OR 2/3: S/P TRACH, omplantation of cellular matrix on left leg 2/4: REMAINS ON PRESSORS, REMAINS ON VENT, REMAINS ON CRRT 2/5: REMAINS ON VENT, OFF PRESSORS, REMAINS ON CRRT 2/6: Patient is improved she is able to follow communication verbally and move extermities. She is on 5L/min Trache collar. We have stopped levophed and working on reducing vasopressin. She remains on CRRT.  Insulin infusion is ongoing and plan to have weight based regimen initiated. Additionally she will have SLP for vocalization and PMV trial as well as ROM training with OT/PT.  Multiple specialists on case appreciate everyone involved.  2/7: patient is improved mildly , she's off CRRT, shes off insulin drip. She is mentating.  She was evaluated by [facility].  She was unable to vocalize with PMV.  She may need PEG tube for nourishment.  2/8: patient is overnight worsened with fever and increased levophed dose.  She is negative 1.7L.   She is on zosyn.  2/9: patient is improved this am. She is giving thumbs up during examination. 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 She seems volume  ° °NAME:  Alexandra Foster, MRN:  [id number], DOB:  03/06/1951, LOS: 29 °ADMISSION DATE:  05/04/2021, CONSULTATION DATE:  05/04/2021 °REFERRING MD:  Isaacs, Cameron MD CHIEF COMPLAINT:  Left leg pain  ° °HPI  °70 y.o female with medical history significant of Asthma/COPD, T2DM, HTN, OSA on CPAP, GERD, COVID-19 pneumonia, and Bilateral lower extremity edema who presented to the ED with chief complaints of LLE pain and chills since Thursday. ° °Patient report onset of symptoms for the past several days with worsening pain, swelling, fevers and chills, poor po intake and difficulty walking since Thursday. Denies injury to extremity or hx of cellulitis. ° °ED Course: In the emergency department, the temperature was 37.7°C, the heart rate 103 beats/minute, the blood pressure 75/64  mm Hg, the respiratory rate 27 breaths/minute, and the oxygen saturation 99% on RA. CT of left lower leg showed diffuse subcutaneous edema concerning for cellulitis with no fluid collection/abscess. °Pertinent Labs in Red/Diagnostics Findings: °Na+/ K+: 141/2.9 °Glucose: 144 °BUN/Cr.: 44/3.47 °AST/ALT:67/48 °  °WBC/ TMAX: 16.5/ afebrile °PCT: pending °Lactic acid: 3.8>6.0 °COVID PCR: Negative ° °Patient given 30 cc/kg of fluids and started on broad-spectrum antibiotics for sepsis with septic shock. Patient remained hypotensive despite IVF boluses therefore was started on Levophed. PCCM consulted. ° °Past Medical History  ° ° Arthritis  ° Asthma  ° COPD (chronic obstructive pulmonary disease) ([facility])  ° Diabetes mellitus without complication ([facility])  ° Endometriosis  ° GERD (gastroesophageal reflux disease)  ° Hypertension  ° Obesity  ° Sleep apnea  °  CPAP  ° °Significant Hospital Events   °1/21: Admitted to the ICU with severe sepsis with shock secondary to cellulitis of LLE °1/23: off pressors +Afib with RVR °1/24: OR for fasciotomy °1/25: post op resp failure with severe septic shock °1/26: remains on pressors °1/27: returns to the OR today for  debridement, wound VAC °1/28: overnight with hemodynamic deterioration, required reintubation, currently on pressors °1/29: remains on 3 pressors, CRRT, wound VAC and minimal sedation.  Awakens to voice and touch.  Seen by vascular surgery and awaiting input from general surgery regarding amputation of left leg °2/1: remains on pressors, on CRRT, MRI/CT shows SAH, afib with RVR, ECHO pending, restarted AMIO at 60 mg/hr °2/3: remains on crrt, remains on vent, plan for TRACH and wound vac change in OR °2/3: S/P TRACH, omplantation of cellular matrix on left leg °2/4: REMAINS ON PRESSORS, REMAINS ON VENT, REMAINS ON CRRT °2/5: REMAINS ON VENT, OFF PRESSORS, REMAINS ON CRRT °2/6: Patient is improved she is able to follow communication verbally and move extermities. She is on 5L/min Trache collar. We have stopped levophed and working on reducing vasopressin. She remains on CRRT.  Insulin infusion is ongoing and plan to have weight based regimen initiated. Additionally she will have SLP for vocalization and PMV trial as well as ROM training with OT/PT.  Multiple specialists on case appreciate everyone involved.  °2/7: patient is improved mildly , she's off CRRT, shes off insulin drip. She is mentating.  She was evaluated by [facility].  She was unable to vocalize with PMV.  She may need PEG tube for nourishment.  °2/8: patient is overnight worsened with fever and increased levophed dose.  She is negative 1.7L.   She is on zosyn.  °2/9: patient is improved this am. She is giving thumbs up during examination. 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 She seems volume depleted   ° °NAME:  Alexandra Foster, MRN:  [id number], DOB:  03/06/1951, LOS: 29 °ADMISSION DATE:  05/04/2021, CONSULTATION DATE:  05/04/2021 °REFERRING MD:  Isaacs, Cameron MD CHIEF COMPLAINT:  Left leg pain  ° °HPI  °70 y.o female with medical history significant of Asthma/COPD, T2DM, HTN, OSA on CPAP, GERD, COVID-19 pneumonia, and Bilateral lower extremity edema who presented to the ED with chief complaints of LLE pain and chills since Thursday. ° °Patient report onset of symptoms for the past several days with worsening pain, swelling, fevers and chills, poor po intake and difficulty walking since Thursday. Denies injury to extremity or hx of cellulitis. ° °ED Course: In the emergency department, the temperature was 37.7°C, the heart rate 103 beats/minute, the blood pressure 75/64  mm Hg, the respiratory rate 27 breaths/minute, and the oxygen saturation 99% on RA. CT of left lower leg showed diffuse subcutaneous edema concerning for cellulitis with no fluid collection/abscess. °Pertinent Labs in Red/Diagnostics Findings: °Na+/ K+: 141/2.9 °Glucose: 144 °BUN/Cr.: 44/3.47 °AST/ALT:67/48 °  °WBC/ TMAX: 16.5/ afebrile °PCT: pending °Lactic acid: 3.8>6.0 °COVID PCR: Negative ° °Patient given 30 cc/kg of fluids and started on broad-spectrum antibiotics for sepsis with septic shock. Patient remained hypotensive despite IVF boluses therefore was started on Levophed. PCCM consulted. ° °Past Medical History  ° ° Arthritis  ° Asthma  ° COPD (chronic obstructive pulmonary disease) ([facility])  ° Diabetes mellitus without complication ([facility])  ° Endometriosis  ° GERD (gastroesophageal reflux disease)  ° Hypertension  ° Obesity  ° Sleep apnea  °  CPAP  ° °Significant Hospital Events   °1/21: Admitted to the ICU with severe sepsis with shock secondary to cellulitis of LLE °1/23: off pressors +Afib with RVR °1/24: OR for fasciotomy °1/25: post op resp failure with severe septic shock °1/26: remains on pressors °1/27: returns to the OR today for  debridement, wound VAC °1/28: overnight with hemodynamic deterioration, required reintubation, currently on pressors °1/29: remains on 3 pressors, CRRT, wound VAC and minimal sedation.  Awakens to voice and touch.  Seen by vascular surgery and awaiting input from general surgery regarding amputation of left leg °2/1: remains on pressors, on CRRT, MRI/CT shows SAH, afib with RVR, ECHO pending, restarted AMIO at 60 mg/hr °2/3: remains on crrt, remains on vent, plan for TRACH and wound vac change in OR °2/3: S/P TRACH, omplantation of cellular matrix on left leg °2/4: REMAINS ON PRESSORS, REMAINS ON VENT, REMAINS ON CRRT °2/5: REMAINS ON VENT, OFF PRESSORS, REMAINS ON CRRT °2/6: Patient is improved she is able to follow communication verbally and move extermities. She is on 5L/min Trache collar. We have stopped levophed and working on reducing vasopressin. She remains on CRRT.  Insulin infusion is ongoing and plan to have weight based regimen initiated. Additionally she will have SLP for vocalization and PMV trial as well as ROM training with OT/PT.  Multiple specialists on case appreciate everyone involved.  °2/7: patient is improved mildly , she's off CRRT, shes off insulin drip. She is mentating.  She was evaluated by [facility].  She was unable to vocalize with PMV.  She may need PEG tube for nourishment.  °2/8: patient is overnight worsened with fever and increased levophed dose.  She is negative 1.7L.   She is on zosyn.  °2/9: patient is improved this am. She is giving thumbs up during examination. 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 She seems volume depleted   NAME:  Alexandra Foster, MRN:  [id number], DOB:  [birth year], LOS: 20 ADMISSION DATE:  05/04/2021, CONSULTATION DATE:  05/04/2021 REFERRING MD:  Duffy Bruce MD CHIEF COMPLAINT:  Left leg pain   HPI  71 y.o female with medical history significant of Asthma/COPD, T2DM, HTN, OSA on CPAP, GERD, COVID-19 pneumonia, and Bilateral lower extremity edema who presented to the ED with chief complaints of LLE pain and chills since Thursday.  Patient report onset of symptoms for the past several days with worsening pain, swelling, fevers and chills, poor po intake and difficulty walking since Thursday. Denies injury to extremity or hx of cellulitis.  ED Course: In the emergency department, the temperature was 37.7C, the heart rate 103 beats/minute, the blood pressure 75/64  mm Hg, the respiratory rate 27 breaths/minute, and the oxygen saturation 99% on RA. CT of left lower leg showed diffuse subcutaneous edema concerning for cellulitis with no fluid collection/abscess. Pertinent Labs in Red/Diagnostics Findings: Na+/ K+: 141/2.9 Glucose: 144 BUN/Cr.: 44/3.47 AST/ALT:67/48   WBC/ TMAX: 16.5/ afebrile PCT: pending Lactic acid: 3.8>6.0 COVID PCR: Negative  Patient given 30 cc/kg of fluids and started on broad-spectrum antibiotics for sepsis with septic shock. Patient remained hypotensive despite IVF boluses therefore was started on Levophed. PCCM consulted.  Past Medical History    Arthritis   Asthma   COPD (chronic obstructive pulmonary disease) ([place])   Diabetes mellitus without complication ([place])   Endometriosis   GERD (gastroesophageal reflux disease)   Hypertension   Obesity   Sleep apnea    CPAP   Significant Hospital Events   1/21: Admitted to the ICU with severe sepsis with shock secondary to cellulitis of LLE 1/23: off pressors +Afib with RVR 1/24: OR for fasciotomy 1/25: post op resp failure with severe septic shock 1/26: remains on pressors 1/27: returns to the OR today for  debridement, wound VAC 1/28: overnight with hemodynamic deterioration, required reintubation, currently on pressors 1/29: remains on 3 pressors, CRRT, wound VAC and minimal sedation.  Awakens to voice and touch.  Seen by vascular surgery and awaiting input from general surgery regarding amputation of left leg 2/1: remains on pressors, on CRRT, MRI/CT shows SAH, afib with RVR, ECHO pending, restarted AMIO at 60 mg/hr 2/3: remains on crrt, remains on vent, plan for [facility] and wound vac change in OR 2/3: S/P TRACH, omplantation of cellular matrix on left leg 2/4: REMAINS ON PRESSORS, REMAINS ON VENT, REMAINS ON CRRT 2/5: REMAINS ON VENT, OFF PRESSORS, REMAINS ON CRRT 2/6: Patient is improved she is able to follow communication verbally and move extermities. She is on 5L/min Trache collar. We have stopped levophed and working on reducing vasopressin. She remains on CRRT.  Insulin infusion is ongoing and plan to have weight based regimen initiated. Additionally she will have SLP for vocalization and PMV trial as well as ROM training with OT/PT.  Multiple specialists on case appreciate everyone involved.  2/7: patient is improved mildly , she's off CRRT, shes off insulin drip. She is mentating.  She was evaluated by [facility].  She was unable to vocalize with PMV.  She may need PEG tube for nourishment.  2/8: patient is overnight worsened with fever and increased levophed dose.  She is negative 1.7L.   She is on zosyn.  2/9: patient is improved this am. She is giving thumbs up during examination. 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 She seems volume  NAME:  Alexandra Foster, MRN:  [id number], DOB:  [birth year], LOS: 20 ADMISSION DATE:  05/04/2021, CONSULTATION DATE:  05/04/2021 REFERRING MD:  Duffy Bruce MD CHIEF COMPLAINT:  Left leg pain   HPI  71 y.o female with medical history significant of Asthma/COPD, T2DM, HTN, OSA on CPAP, GERD, COVID-19 pneumonia, and Bilateral lower extremity edema who presented to the ED with chief complaints of LLE pain and chills since Thursday.  Patient report onset of symptoms for the past several days with worsening pain, swelling, fevers and chills, poor po intake and difficulty walking since Thursday. Denies injury to extremity or hx of cellulitis.  ED Course: In the emergency department, the temperature was 37.7C, the heart rate 103 beats/minute, the blood pressure 75/64  mm Hg, the respiratory rate 27 breaths/minute, and the oxygen saturation 99% on RA. CT of left lower leg showed diffuse subcutaneous edema concerning for cellulitis with no fluid collection/abscess. Pertinent Labs in Red/Diagnostics Findings: Na+/ K+: 141/2.9 Glucose: 144 BUN/Cr.: 44/3.47 AST/ALT:67/48   WBC/ TMAX: 16.5/ afebrile PCT: pending Lactic acid: 3.8>6.0 COVID PCR: Negative  Patient given 30 cc/kg of fluids and started on broad-spectrum antibiotics for sepsis with septic shock. Patient remained hypotensive despite IVF boluses therefore was started on Levophed. PCCM consulted.  Past Medical History    Arthritis   Asthma   COPD (chronic obstructive pulmonary disease) ([place])   Diabetes mellitus without complication ([place])   Endometriosis   GERD (gastroesophageal reflux disease)   Hypertension   Obesity   Sleep apnea    CPAP   Significant Hospital Events   1/21: Admitted to the ICU with severe sepsis with shock secondary to cellulitis of LLE 1/23: off pressors +Afib with RVR 1/24: OR for fasciotomy 1/25: post op resp failure with severe septic shock 1/26: remains on pressors 1/27: returns to the OR today for  debridement, wound VAC 1/28: overnight with hemodynamic deterioration, required reintubation, currently on pressors 1/29: remains on 3 pressors, CRRT, wound VAC and minimal sedation.  Awakens to voice and touch.  Seen by vascular surgery and awaiting input from general surgery regarding amputation of left leg 2/1: remains on pressors, on CRRT, MRI/CT shows SAH, afib with RVR, ECHO pending, restarted AMIO at 60 mg/hr 2/3: remains on crrt, remains on vent, plan for [facility] and wound vac change in OR 2/3: S/P TRACH, omplantation of cellular matrix on left leg 2/4: REMAINS ON PRESSORS, REMAINS ON VENT, REMAINS ON CRRT 2/5: REMAINS ON VENT, OFF PRESSORS, REMAINS ON CRRT 2/6: Patient is improved she is able to follow communication verbally and move extermities. She is on 5L/min Trache collar. We have stopped levophed and working on reducing vasopressin. She remains on CRRT.  Insulin infusion is ongoing and plan to have weight based regimen initiated. Additionally she will have SLP for vocalization and PMV trial as well as ROM training with OT/PT.  Multiple specialists on case appreciate everyone involved.  2/7: patient is improved mildly , she's off CRRT, shes off insulin drip. She is mentating.  She was evaluated by [facility].  She was unable to vocalize with PMV.  She may need PEG tube for nourishment.  2/8: patient is overnight worsened with fever and increased levophed dose.  She is negative 1.7L.   She is on zosyn.  2/9: patient is improved this am. She is giving thumbs up during examination. 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 She seems volume

## 2021-06-02 NOTE — Progress Notes (Signed)
Dr. Jonnie Finner notified this am regarding patients heart rate and rhythm. Orders to give amiodarone early and LR bolus. Notified MD regarding lactic acid of 3.9. No additional orders at this time, continue to assess.

## 2021-06-02 NOTE — Progress Notes (Signed)
[facility]  PROGRESS NOTE   Subjective:   Critical illness still persist. Still on pressors. Patient maintained on ventilatory support. CRRT continues well. Patient remains anuric.   02/18 0701 - 02/19 0700 In: 2344.6 [I.V.:269.3; Blood:353.3; NG/GT:455; IV Piggyback:1267] Out: 1368 [Drains:50]    Objective:  Vital signs in last 24 hours:  Temp:  [98.9 F (37.2 C)-100.2 F (37.9 C)] 99.1 F (37.3 C) (02/19 1020) Pulse Rate:  [73-146] 87 (02/19 1200) Resp:  [13-23] 23 (02/19 1200) BP: (107-162)/(48-79) 159/65 (02/19 1200) SpO2:  [96 %-100 %] 96 % (02/19 1200) Arterial Line BP: (90-175)/(25-79) 127/50 (02/19 1230) FiO2 (%):  [28 %-30 %] 28 % (02/19 1130)  Weight change:  Filed Weights   05/30/21 0500 05/31/21 0500 06/01/21 0500  Weight: 116.1 kg 111.7 kg 111.4 kg    Intake/Output: I/O last 3 completed shifts: In: 3488.3 [I.V.:331.1; Blood:353.3; Other:60; NG/GT:1230; IV Piggyback:1513.9] Out: 2505 [Drains:50; Other:2455]   Intake/Output this shift:  Total I/O In: 891.5 [I.V.:34; Blood:440; NG/GT:325; IV Piggyback:92.6] Out: 16 [Drains:40; Other:855]  Physical Exam: General:  Critically ill  Head:  [place]/AT, OM moist  Eyes:  Anicteric  Neck:  tracheostomy in place  Lungs:   diminished bilaterally, vent assisted  Heart:  regular  Abdomen:   Soft, nontender  Extremities:  Left lower extremity wound vac  Neurologic:  Awake, alert  Skin:  No lesions  Access:  Left IJ temp HD catheter    Basic Metabolic Panel: Recent Labs  Lab 05/29/21 0422 05/29/21 1531 05/30/21 0430 05/30/21 1532 05/31/21 0306 05/31/21 1624 06/01/21 0423 06/01/21 1510 06/02/21 0503  NA 137   < > 137   < > 137 136 136 134* 133*  K 4.2   < > 4.5   < > 5.2* 4.8 4.4 4.1 4.2  CL 105   < > 104   < > 104 102 102 100 101  CO2 26   < > 26   < > '26 24 24 24 24  ' GLUCOSE 143*   < > 209*   < > 301* 235* 252* 162* 193*  BUN 37*   < > 36*   < > 40* 47* 51* 61* 56*  CREATININE  1.33*   < > 1.27*   < > 1.45* 1.32* 1.39* 1.36* 1.31*  CALCIUM 9.1   < > 9.0   < > 8.4* 9.2 9.5 9.9 9.3  MG 2.5*  --  2.6*  --  2.5*  --  2.2  --  2.1  PHOS 3.5   < > 3.1   < > 2.9 2.8 2.4* 2.2* 2.6   < > = values in this interval not displayed.     CBC: Recent Labs  Lab 05/29/21 0422 05/30/21 0430 05/31/21 0306 06/01/21 0423 06/01/21 1013 06/02/21 0503  WBC 9.8 9.4 10.8* 10.5  --  9.7  NEUTROABS 8.8* 8.7* 9.9* 9.4*  --  8.3*  HGB 8.4* 8.7* 8.0* 6.4* 7.5* 6.9*  HCT 27.3* 28.1* 27.0* 22.2* 25.1* 22.9*  MCV 94.1 94.6 96.8 98.2  --  95.0  PLT 184 199 173 140*  --  146*      Urinalysis: No results for input(s): COLORURINE, LABSPEC, PHURINE, GLUCOSEU, HGBUR, BILIRUBINUR, KETONESUR, PROTEINUR, UROBILINOGEN, NITRITE, LEUKOCYTESUR in the last 72 hours.  Invalid input(s): APPERANCEUR    Imaging: DG Chest Port 1 View  Result Date: 06/01/2021 CLINICAL DATA:  Central line placement EXAM: PORTABLE CHEST 1 VIEW COMPARISON:  Radiograph 05/31/2021 FINDINGS: Tracheostomy tube overlies the midthoracic trachea.  Right neck catheter tip overlies the right atrium. Left neck catheter tip overlies the region of the distal superior vena cava. Bibasilar opacities. No visible pneumothorax. No large effusion. No acute osseous abnormality. IMPRESSION: Left neck central venous catheter tip overlies the distal SVC. Right neck catheter tip overlies the right atrium. Bibasilar opacities, likely atelectasis.  No visible pneumothorax. Electronically Signed   By: Maurine Simmering M.D.   On: 06/01/2021 15:43     Medications:    albumin human 60 mL/hr at 06/02/21 1200   feeding supplement (PIVOT 1.5 CAL) 1,000 mL (06/02/21 1032)   norepinephrine (LEVOPHED) Adult infusion 6 mcg/min (06/02/21 1200)   prismasol BGK 0/2.5     prismasol BGK 2/2.5 dialysis solution 1,000 mL/hr at 06/02/21 1003   prismasol BGK 2/2.5 replacement solution 500 mL/hr at 06/01/21 1725   prismasol BGK 2/2.5 replacement solution 300 mL/hr at  06/01/21 1725    sodium chloride   Intravenous Once   sodium chloride   Intravenous Once   sodium chloride   Intravenous Once   amiodarone  200 mg Per Tube BID   vitamin C  500 mg Per Tube BID   chlorhexidine gluconate (MEDLINE KIT)  15 mL Mouth Rinse BID   Chlorhexidine Gluconate Cloth  6 each Topical Q0600   Darbepoetin Alfa  100 mcg Subcutaneous Q7 days   escitalopram  10 mg Per Tube Daily   free water  30 mL Per Tube Q4H   gentamicin irrigation  80 mg Irrigation QID   hydrocortisone sod succinate (SOLU-CORTEF) inj  100 mg Intravenous BID   insulin aspart  0-20 Units Subcutaneous Q4H   insulin aspart  8 Units Subcutaneous Q4H   insulin detemir  23 Units Subcutaneous BID   ipratropium-albuterol  3 mL Nebulization Q6H   magic mouthwash w/lidocaine  10 mL Oral QID   mouth rinse  15 mL Mouth Rinse 10 times per day   midodrine  15 mg Per Tube TID WC   multivitamin  1 tablet Per Tube QHS   nutrition supplement (JUVEN)  1 packet Per Tube BID BM   oxyCODONE  5 mg Per Tube Q6H   pantoprazole (PROTONIX) IV  40 mg Intravenous Q24H   sodium chloride flush  10-40 mL Intracatheter Q12H   vitamin A  10,000 Units Per Tube Daily   zinc sulfate  220 mg Per Tube Daily    Assessment/ Plan:     Principal Problem:   Severe sepsis with septic shock (CODE) (HCC) Active Problems:   COPD (chronic obstructive pulmonary disease) (HCC)   Morbid obesity ([place])   Diabetes mellitus type 2 in obese (HCC)   Atrial fibrillation and flutter ([place])   Necrotizing fasciitis ([place])   Acute on chronic respiratory failure ([place])   On mechanically assisted ventilation (HCC)   Shock liver   Anemia associated with acute blood loss   Metabolic acidosis   Encounter for continuous renal replacement therapy (CRRT) for acute renal failure (HCC)   Acute encephalopathy   Group A streptococcal infection   Streptococcal toxic shock syndrome ([place])   Status post tracheostomy ([place])   Subdural hemorrhage ([place])   Left leg  cellulitis  Ms. Ajeenah Heiny Pettis is a 71 y.o. black female with COPD/asthma, diabetes mellitus type II, hypertension, obstructive sleep apnea, paroxysmal atrial fibrillation, GERD, who presents to [facility] on 05/04/2021 for AKI (acute kidney injury) ([place]) [N17.9] Left leg cellulitis [L03.116] Severe sepsis ([place]) [A41.9, R65.20] Severe sepsis with lactic acidosis ([place]) [A41.9, R65.20, E87.20] Found  to have necrotizing fascitis requiring multiple debridements by Dr. Peyton Najjar: 1/24. 1/27, 2/3 and 2/10.    #1: Acute kidney injury: AKI is secondary to ischemic nephropathy secondary to hypotension complicated with sepsis. History of bland urine. Baseline creatinine of 0.98, GFR > 60 on 02/11/21.  -Critical illness persist at this time and the patient's blood pressure has Been labile.  As such we will maintain the patient on CRRT at this time.  Patient remains anuric.    #2: Sepsis and hypotension: necrotizing fasciitis for left lower extremity status post multiple debridement.  Requiring vasopressors: norepinephrine. On stress dose steroids and midodrine -Remains on norepinephrine drip at this time.  #3. Anemia with Acute kidney injury:  Lab Results  Component Value Date   HGB 6.9 (L) 06/02/2021    S/p multiple blood transfusions this admission Aranesp weekly SQ (mon) Patient maintained on vent support at this time.  Weaning as per pulmonary/critical care.  #4. Acute Respiratory Failure.  Continue ventilatory support.     LOS: 43 Levone Otten Holley Raring, [facility] 2/19/202312:45 PM

## 2021-06-02 NOTE — Plan of Care (Addendum)
Patient now off of CRRT. Remains on pressors. Plan for PSV overnight, however, is on ATC at time of writing.   0700 update: weaned off pressors overnight. Patient refused all attempts at provision of oral care despite education.   Problem: Education: Goal: Knowledge of General Education information will improve Description: Including pain rating scale, medication(s)/side effects and non-pharmacologic comfort measures Outcome: Not Progressing   Problem: Health Behavior/Discharge Planning: Goal: Ability to manage health-related needs will improve Outcome: Not Progressing   Problem: Clinical Measurements: Goal: Ability to maintain clinical measurements within normal limits will improve Outcome: Not Progressing Goal: Will remain free from infection Outcome: Not Progressing Goal: Diagnostic test results will improve Outcome: Not Progressing   Problem: Pain Managment: Goal: General experience of comfort will improve Outcome: Not Progressing   Problem: Safety: Goal: Ability to remain free from injury will improve Outcome: Not Progressing   Problem: Skin Integrity: Goal: Risk for impaired skin integrity will decrease Outcome: Not Progressing   Problem: Activity: Goal: Ability to tolerate increased activity will improve Outcome: Not Progressing   Problem: Respiratory: Goal: Ability to maintain a clear airway and adequate ventilation will improve Outcome: Not Progressing   Problem: Role Relationship: Goal: Method of communication will improve Outcome: Not Progressing   Problem: Education: Goal: Knowledge of disease and its progression will improve Outcome: Not Progressing   Problem: Clinical Measurements: Goal: Complications related to the disease process or treatment will be avoided or minimized Outcome: Not Progressing Goal: Dialysis access will remain free of complications Outcome: Not Progressing   Problem: Fluid Volume: Goal: Fluid volume balance will be maintained  or improved Outcome: Not Progressing   Problem: Urinary Elimination: Goal: Progression of disease will be identified and treated Outcome: Not Progressing   Problem: Education: Goal: Knowledge of disease or condition will improve Outcome: Not Progressing Goal: Knowledge of secondary prevention will improve (SELECT ALL) Outcome: Not Progressing Goal: Knowledge of patient specific risk factors will improve (INDIVIDUALIZE FOR PATIENT) Outcome: Not Progressing Goal: Individualized Educational Video(s) Outcome: Not Progressing   Problem: Coping: Goal: Will verbalize positive feelings about self Outcome: Not Progressing Goal: Will identify appropriate support needs Outcome: Not Progressing   Problem: Health Behavior/Discharge Planning: Goal: Ability to manage health-related needs will improve Outcome: Not Progressing   Problem: Self-Care: Goal: Ability to participate in self-care as condition permits will improve Outcome: Not Progressing Goal: Verbalization of feelings and concerns over difficulty with self-care will improve Outcome: Not Progressing Goal: Ability to communicate needs accurately will improve Outcome: Not Progressing   Problem: Nutrition: Goal: Risk of aspiration will decrease Outcome: Not Progressing Goal: Dietary intake will improve Outcome: Not Progressing   Problem: Spontaneous Subarachnoid Hemorrhage Tissue Perfusion: Goal: Complications of Spontaneous Subarachnoid Hemorrhage will be minimized Outcome: Not Progressing

## 2021-06-02 NOTE — Consult Note (Signed)
PHARMACY CONSULT NOTE  Pharmacy Consult for Electrolyte Monitoring and Replacement   Recent Labs: Potassium (mmol/L)  Date Value  06/02/2021 4.2  08/11/2013 3.3 (L)   Magnesium (mg/dL)  Date Value  06/02/2021 2.1   Calcium (mg/dL)  Date Value  06/02/2021 9.3   Calcium, Total (mg/dL)  Date Value  08/11/2013 9.2   Albumin (g/dL)  Date Value  06/02/2021 3.2 (L)  08/11/2013 3.1 (L)   Phosphorus (mg/dL)  Date Value  06/02/2021 2.6   Sodium (mmol/L)  Date Value  06/02/2021 133 (L)  08/11/2013 138   Assessment: 71 yo female with a previous medical history of diabetes mellitus type 2, hypertension, obstructive sleep apnea on CPAP, asthma/COPD, GERD, history of COVID-19 pneumonia presenting with lower left extremity pain and chills. Patient found to have severe sepsis and strep pyogenes bacteremia on admission and is currently intubated and sedated. LLL necrotizing fascitis. [facility]  Pharmacy has been consulted to monitor and replace electrolytes.  -CRRT stopped 2/6, re-started (2/8>> -s/p trach 2/3, tolerating trach collar trials  Nutrition:  --Feeds per tube @65ml /hr --Free water per tube 30 mL Q4H  Goal of Therapy:  Electrolytes within normal limits  Plan:  Continues on CRRT No electrolyte replacement warranted for today q12h Renal function panels while on CRRT  Eleonore Chiquito, PharmD, BCPS Clinical Pharmacist 06/02/2021 8:44 AM

## 2021-06-02 NOTE — Progress Notes (Signed)
Spoke with Dr Holley Raring High filter pressure. Per MD discontinue CRRT. He will enter orders. Will re-evaluate dialysis needs in the next couple days. Continue to assess.

## 2021-06-03 DIAGNOSIS — D62 Acute posthemorrhagic anemia: Secondary | ICD-10-CM | POA: Diagnosis not present

## 2021-06-03 DIAGNOSIS — J9620 Acute and chronic respiratory failure, unspecified whether with hypoxia or hypercapnia: Secondary | ICD-10-CM | POA: Diagnosis not present

## 2021-06-03 DIAGNOSIS — J9621 Acute and chronic respiratory failure with hypoxia: Secondary | ICD-10-CM | POA: Diagnosis not present

## 2021-06-03 DIAGNOSIS — R140 Abdominal distension (gaseous): Secondary | ICD-10-CM | POA: Diagnosis not present

## 2021-06-03 LAB — CBC WITH DIFFERENTIAL/PLATELET
Abs Immature Granulocytes: 0.1 10*3/uL — ABNORMAL HIGH (ref 0.00–0.07)
Basophils Absolute: 0.1 10*3/uL (ref 0.0–0.1)
Basophils Relative: 1 %
Eosinophils Absolute: 0 10*3/uL (ref 0.0–0.5)
Eosinophils Relative: 0 %
HCT: 25.5 % — ABNORMAL LOW (ref 36.0–46.0)
Hemoglobin: 7.9 g/dL — ABNORMAL LOW (ref 12.0–15.0)
Immature Granulocytes: 1 %
Lymphocytes Relative: 4 %
Lymphs Abs: 0.4 10*3/uL — ABNORMAL LOW (ref 0.7–4.0)
MCH: 28.5 pg (ref 26.0–34.0)
MCHC: 31 g/dL (ref 30.0–36.0)
MCV: 92.1 fL (ref 80.0–100.0)
Monocytes Absolute: 0.5 10*3/uL (ref 0.1–1.0)
Monocytes Relative: 5 %
Neutro Abs: 8.5 10*3/uL — ABNORMAL HIGH (ref 1.7–7.7)
Neutrophils Relative %: 89 %
Platelets: 158 10*3/uL (ref 150–400)
RBC: 2.77 MIL/uL — ABNORMAL LOW (ref 3.87–5.11)
RDW: 19.5 % — ABNORMAL HIGH (ref 11.5–15.5)
Smear Review: NORMAL
WBC: 9.5 10*3/uL (ref 4.0–10.5)
nRBC: 1.2 % — ABNORMAL HIGH (ref 0.0–0.2)

## 2021-06-03 LAB — TYPE AND SCREEN
ABO/RH(D): O POS
Antibody Screen: NEGATIVE
Unit division: 0
Unit division: 0

## 2021-06-03 LAB — GLUCOSE, CAPILLARY
Glucose-Capillary: 114 mg/dL — ABNORMAL HIGH (ref 70–99)
Glucose-Capillary: 124 mg/dL — ABNORMAL HIGH (ref 70–99)
Glucose-Capillary: 130 mg/dL — ABNORMAL HIGH (ref 70–99)
Glucose-Capillary: 133 mg/dL — ABNORMAL HIGH (ref 70–99)
Glucose-Capillary: 155 mg/dL — ABNORMAL HIGH (ref 70–99)
Glucose-Capillary: 160 mg/dL — ABNORMAL HIGH (ref 70–99)
Glucose-Capillary: 163 mg/dL — ABNORMAL HIGH (ref 70–99)
Glucose-Capillary: 165 mg/dL — ABNORMAL HIGH (ref 70–99)

## 2021-06-03 LAB — RENAL FUNCTION PANEL
Albumin: 2.9 g/dL — ABNORMAL LOW (ref 3.5–5.0)
Anion gap: 7 (ref 5–15)
BUN: 77 mg/dL — ABNORMAL HIGH (ref 8–23)
CO2: 25 mmol/L (ref 22–32)
Calcium: 9.6 mg/dL (ref 8.9–10.3)
Chloride: 101 mmol/L (ref 98–111)
Creatinine, Ser: 1.83 mg/dL — ABNORMAL HIGH (ref 0.44–1.00)
GFR, Estimated: 29 mL/min — ABNORMAL LOW (ref >60)
Glucose, Bld: 161 mg/dL — ABNORMAL HIGH (ref 70–99)
Phosphorus: 3.3 mg/dL (ref 2.5–4.6)
Potassium: 4.6 mmol/L (ref 3.5–5.1)
Sodium: 133 mmol/L — ABNORMAL LOW (ref 135–145)

## 2021-06-03 LAB — GASTROINTESTINAL PANEL BY PCR, STOOL (REPLACES STOOL CULTURE)

## 2021-06-03 LAB — BPAM RBC
Blood Product Expiration Date: 202303222359
Blood Product Expiration Date: 202303222359
ISSUE DATE / TIME: 202302180624
ISSUE DATE / TIME: 202302190822
Unit Type and Rh: 5100
Unit Type and Rh: 5100

## 2021-06-03 LAB — MAGNESIUM: Magnesium: 2 mg/dL (ref 1.7–2.4)

## 2021-06-03 MED ORDER — NUTRISOURCE FIBER PO PACK
1.0000 | PACK | Freq: Two times a day (BID) | ORAL | Status: DC
  Administered 2021-06-03 – 2021-06-04 (×3): 1
  Filled 2021-06-03 (×5): qty 1

## 2021-06-03 NOTE — Progress Notes (Signed)
NAME:  Alexandra Foster, MRN:  [id number], DOB:  December 28, 1950, LOS: 79 ADMISSION DATE:  05/04/2021, CONSULTATION DATE:  05/04/2021 REFERRING MD:  Duffy Bruce MD CHIEF COMPLAINT:  Left leg pain   HPI  71 y.o female with medical history significant of Asthma/COPD, T2DM, HTN, OSA on CPAP, GERD, COVID-19 pneumonia, and Bilateral lower extremity edema who presented to the ED with chief complaints of LLE pain and chills since Thursday.  Patient report onset of symptoms for the past several days with worsening pain, swelling, fevers and chills, poor po intake and difficulty walking since Thursday. Denies injury to extremity or hx of cellulitis.  ED Course: In the emergency department, the temperature was 37.7C, the heart rate 103 beats/minute, the blood pressure 75/64  mm Hg, the respiratory rate 27 breaths/minute, and the oxygen saturation 99% on RA. CT of left lower leg showed diffuse subcutaneous edema concerning for cellulitis with no fluid collection/abscess. Pertinent Labs in Red/Diagnostics Findings: Na+/ K+: 141/2.9 Glucose: 144 BUN/Cr.: 44/3.47 AST/ALT:67/48   WBC/ TMAX: 16.5/ afebrile PCT: pending Lactic acid: 3.8>6.0 COVID PCR: Negative  Patient given 30 cc/kg of fluids and started on broad-spectrum antibiotics for sepsis with septic shock. Patient remained hypotensive despite IVF boluses therefore was started on Levophed. PCCM consulted.  Past Medical History    Arthritis   Asthma   COPD (chronic obstructive pulmonary disease) ([place])   Diabetes mellitus without complication ([place])   Endometriosis   GERD (gastroesophageal reflux disease)   Hypertension   Obesity   Sleep apnea    CPAP   Significant Hospital Events   1/21: Admitted to the ICU with severe sepsis with shock secondary to cellulitis of LLE 1/23: off pressors +Afib with RVR 1/24: OR for fasciotomy 1/25: post op resp failure with severe septic shock 1/26: remains on pressors 1/27: returns to the OR today for  debridement, wound VAC 1/28: overnight with hemodynamic deterioration, required reintubation, currently on pressors 1/29: remains on 3 pressors, CRRT, wound VAC and minimal sedation.  Awakens to voice and touch.  Seen by vascular surgery and awaiting input from general surgery regarding amputation of left leg 2/1: remains on pressors, on CRRT, MRI/CT shows SAH, afib with RVR, ECHO pending, restarted AMIO at 60 mg/hr 2/3: remains on crrt, remains on vent, plan for [facility] and wound vac change in OR 2/3: S/P TRACH, omplantation of cellular matrix on left leg 2/4: REMAINS ON PRESSORS, REMAINS ON VENT, REMAINS ON CRRT 2/5: REMAINS ON VENT, OFF PRESSORS, REMAINS ON CRRT 2/6: Patient is improved she is able to follow communication verbally and move extermities. She is on 5L/min Trache collar. We have stopped levophed and working on reducing vasopressin. She remains on CRRT.  Insulin infusion is ongoing and plan to have weight based regimen initiated. Additionally she will have SLP for vocalization and PMV trial as well as ROM training with OT/PT.  Multiple specialists on case appreciate everyone involved.  2/7: patient is improved mildly , she's off CRRT, shes off insulin drip. She is mentating.  She was evaluated by [facility].  She was unable to vocalize with PMV.  She may need PEG tube for nourishment.  2/8: patient is overnight worsened with fever and increased levophed dose.  She is negative 1.7L.   She is on zosyn.  2/9: patient is improved this am. She is giving thumbs up during examination. 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 She seems volume depleted  as well so we will start CVP monitoring and give IV LR 1L bolus today to check for volume responsiveness. Albumin also while on CRRT. PT/OT starting today. Type and screen repeat due to plan for OR in am. On zosyn with ID following. Starting [place] heparin today.   2/10: patient is s/p repeat surgery on RLE. She is on levophed 32mg post surgery. She is not tachycardic and able to follow some verbal communication.  2/11: patient stable overnight. Reordering PT/OT today. Wbc count slightly trending up remains on abx probably post surgical.  Remains severely anemic on CRRT and vasopressor support with tracheostomy.  2/12: patient with severe anemia today.  Reviewed with renal team for blood transfusion today.  2/13: remains critically ill, on pressors and CRRT 2/13: trach changed for a #8 cuffed distal XLT without difficulty. Wound cleaner. PER ENT 2/14: remains on pressors, CRRT, VENT, plan for PEG and WOUND vac change at bedside; received 1 unit of PRBC's due to anemia  2/15: s/p IVC filter placement due to inability to tolerate anticoagulation  2/16: plan for Trach collar trials as tolerated.  Continues on CRRT. Pt refusing nursing care and to work with PT/OT, Palliative Care re-consulted 2/17: Pt seems depressed, refusing nursing care.  Consult Psychiatry. 2/18: Dialysis filter continues to clot requiring filter changes with inability to return blood back.  Hgb 6.4 pt received 1 unit pRBC's.  Increased amiodarone frequency from 200 mg daily to bid due to pt converting in and out of atrial fibrillation with rvr 2/19: CRRT discontinued due to frequent clotting of circuit resulting in acute blood loss anemia due to inability to return blood back to pt  2/20: Pt currently off of vasopressors following iv fluid resuscitation   Consults:  PTrianaSurgery  Nephrology ID  Speech  ENT   Significant Diagnostic Tests:  1/21: Chest Xray> no active cardiopulmonary process 1/21: CT left lower leg>Diffuse subcutaneous edema may represent cellulitis. No drainable fluid collection/abscess 1/21: Ultrasound lower unilateral left> no DVT  Micro Data:  1/21: SARS-CoV-2 PCR>>negative 1/21: Influenza PCR>>negative 1/21: Blood culture x2>>group A strep (strep  pyogenes) 1/21: Urine Culture>negative  1/21: MRSA PCR>>negative  1/24: Left leg wound culture>>negative  2/7: Trach site would culture>>few pseudomonas aeruginosa and few candida albicans  2/8: MRSA PCR>>negative  Antimicrobials:  Vancomycin 1/21>>1/22 Cefepime 1/21>>01/22 Clindamycin 1/22>>1/25 Ceftriaxone 1/21 X1; restarted 1/31>>2/6 Penicillin G 1/22>>1/31 Linezolid 1/25>>1/31 Metronidazole 02/6 x1 dose Unasyn 2/7>>2/7 Zosyn 2/7>>2/17  Subjective:  Pt currently on SBT 5/5 tolerating well   OBJECTIVE  Blood pressure (!) 126/55, pulse 87, temperature 98.6 F (37 C), temperature source Axillary, resp. rate 17, height _0  (1.651 m), weight 112.6 kg, SpO2 96 %.    Vent Mode: PSV FiO2 (%):  [21 %-30 %] 21 % PEEP:  [5 cmH20] 5 cmH20 Pressure Support:  [5 cmH20] 5 cmH20   Intake/Output Summary (Last 24 hours) at 06/03/2021 0728 Last data filed at 06/03/2021 0700 Gross per 24 hour  Intake 4595.89 ml  Output 1851 ml  Net 2744.89 ml   Filed Weights   05/31/21 0500 06/01/21 0500 06/03/21 0500  Weight: 111.7 kg 111.4 kg 112.6 kg     Physical Examination  General: chronically ill appearing female, NAD resting in bed on vent HENT: supple, size 8 XLT tracheostomy present dressing dry/intact  Lungs: rhonchi throughout, even, non labored  Cardiovascular: RRR, no M/R/G, 2+ radial/2+ distal pulses Abdomen: +BS x4, obese, soft, non tender, non distended  Extremities: LLE negative pressure dressing present clean  dry and intact Neuro: Awake, intermittently follows commands, generalized weakness (but no focal deficits) PERRL; affect very flat  GU: deferred anuric  Skin: see below    Labs/imaging that I havepersonally reviewed  (right click and "Reselect all SmartList Selections" daily)  All labs reviewed 06/03/2021   Labs   CBC: Recent Labs  Lab 05/30/21 0430 05/31/21 0306 06/01/21 0423 06/01/21 1013 06/02/21 0503 06/03/21 0534  WBC 9.4 10.8* 10.5  --  9.7 9.5   NEUTROABS 8.7* 9.9* 9.4*  --  8.3* 8.5*  HGB 8.7* 8.0* 6.4* 7.5* 6.9* 7.9*  HCT 28.1* 27.0* 22.2* 25.1* 22.9* 25.5*  MCV 94.6 96.8 98.2  --  95.0 92.1  PLT 199 173 140*  --  146* 016    Basic Metabolic Panel: Recent Labs  Lab 05/29/21 0422 05/29/21 1531 05/30/21 0430 05/30/21 1532 05/31/21 0306 05/31/21 1624 06/01/21 0423 06/01/21 1510 06/02/21 0503 06/03/21 0534  NA 137   < > 137   < > 137 136 136 134* 133* 133*  K 4.2   < > 4.5   < > 5.2* 4.8 4.4 4.1 4.2 4.6  CL 105   < > 104   < > 104 102 102 100 101 101  CO2 26   < > 26   < > _0 GLUCOSE 143*   < > 209*   < > 301* 235* 252* 162* 193* 161*  BUN 37*   < > 36*   < > 40* 47* 51* 61* 56* 77*  CREATININE 1.33*   < > 1.27*   < > 1.45* 1.32* 1.39* 1.36* 1.31* 1.83*  CALCIUM 9.1   < > 9.0   < > 8.4* 9.2 9.5 9.9 9.3 9.6  MG 2.5*  --  2.6*  --  2.5*  --  2.2  --  2.1  --   PHOS 3.5   < > 3.1   < > 2.9 2.8 2.4* 2.2* 2.6 3.3   < > = values in this interval not displayed.   GFR: Estimated Creatinine Clearance: 34.7 mL/min (A) (by C-G formula based on SCr of 1.83 mg/dL (H)). Recent Labs  Lab 05/31/21 0306 06/01/21 0423 06/01/21 1041 06/01/21 1830 06/01/21 2200 06/02/21 0503 06/02/21 0815 06/02/21 1111 06/03/21 0534  WBC 10.8* 10.5  --   --   --  9.7  --   --  9.5  LATICACIDVEN  --   --    < > 3.0* 2.7*  --  3.9* 3.0*  --    < > = values in this interval not displayed.    Liver Function Tests: Recent Labs  Lab 05/31/21 0306 05/31/21 1624 06/01/21 0423 06/01/21 1510 06/02/21 0503 06/03/21 0534  AST 75*  --   --   --   --   --   ALT 104*  --   --   --   --   --   ALKPHOS 206*  --   --   --   --   --   BILITOT 1.6*  --   --   --   --   --   PROT 5.8*  --   --   --   --   --   ALBUMIN 2.5*   2.4* 2.2* 3.2* 3.2* 3.2* 2.9*   No results for input(s): LIPASE, AMYLASE in the last 168 hours. No results for input(s): AMMONIA in the last 168 hours.  ABG    Component Value  Date/Time   PHART 7.40  05/14/2021 0341   PCO2ART 45 05/14/2021 0341   PO2ART 129 (H) 05/14/2021 0341   HCO3 27.9 05/14/2021 0341   ACIDBASEDEF 12.8 (H) 05/11/2021 0603   O2SAT 98.9 05/14/2021 0341     Coagulation Profile: Recent Labs  Lab 05/28/21 1601  INR 1.2    Cardiac Enzymes: No results for input(s): CKTOTAL, CKMB, CKMBINDEX, TROPONINI in the last 168 hours.  HbA1C: Hgb A1c MFr Bld  Date/Time Value Ref Range Status  05/05/2021 04:53 AM 6.7 (H) 4.8 - 5.6 % Final    Comment:    (NOTE)         Prediabetes: 5.7 - 6.4         Diabetes: >6.4         Glycemic control for adults with diabetes: <7.0   02/11/2021 09:41 AM 6.3 (H) <5.7 % of total Hgb Final    Comment:    For someone without known diabetes, a hemoglobin  A1c value between 5.7% and 6.4% is consistent with prediabetes and should be confirmed with a  follow-up test. . For someone with known diabetes, a value <7% indicates that their diabetes is well controlled. A1c targets should be individualized based on duration of diabetes, age, comorbid conditions, and other considerations. . This assay result is consistent with an increased risk of diabetes. . Currently, no consensus exists regarding use of hemoglobin A1c for diagnosis of diabetes for children. .     CBG: Recent Labs  Lab 06/02/21 1513 06/02/21 2001 06/03/21 0001 06/03/21 0518 06/03/21 0720  GLUCAP 157* 148* 114* 155* 165*    Review of Systems:   Unable to assess pt mechanically ventilated via tracheostomy, pt also refuses to answer questions  Past Medical History  She,  has a past medical history of AKI (acute kidney injury) ([place]), Arthritis, Asthma, Bacteremia, COPD (chronic obstructive pulmonary disease) ([place]), Diabetes mellitus without complication ([place]), Endometriosis, GERD (gastroesophageal reflux disease), History of echocardiogram, Hypertension, Obesity, PAF (paroxysmal atrial fibrillation) ([place]), and Sleep apnea.   Surgical History    Past Surgical  History:  Procedure Laterality Date   ABDOMINAL HYSTERECTOMY     APPLICATION OF WOUND VAC  05/10/2021   Procedure: APPLICATION OF WOUND VAC;  Surgeon: Herbert Pun, MD;  Location: [facility];  Service: General;;   APPLICATION OF WOUND VAC Left 05/17/2021   Procedure: APPLICATION OF WOUND VAC/WOUND VAC EXCHANGE-Matrix Myriad;  Surgeon: Herbert Pun, MD;  Location: [facility];  Service: General;  Laterality: Left;   APPLICATION OF WOUND VAC  05/24/2021   Procedure: APPLICATION OF WOUND VAC;  Surgeon: Herbert Pun, MD;  Location: [facility];  Service: General;;   COLONOSCOPY  10/29/2006   Dr Allen Norris   COLONOSCOPY WITH PROPOFOL N/A 11/05/2016   Procedure: COLONOSCOPY WITH PROPOFOL;  Surgeon: Robert Bellow, MD;  Location: [facility];  Service: Endoscopy;  Laterality: N/A;   INCISION AND DRAINAGE OF WOUND Left 05/10/2021   Procedure: IRRIGATION AND DEBRIDEMENT LEFT LEG;  Surgeon: Herbert Pun, MD;  Location: [facility];  Service: General;  Laterality: Left;   INCISION AND DRAINAGE OF WOUND Left 05/24/2021   Procedure: IRRIGATION AND DEBRIDEMENT LEFT LEG;  Surgeon: Herbert Pun, MD;  Location: [facility];  Service: General;  Laterality: Left;   IVC FILTER INSERTION N/A 05/29/2021   Procedure: IVC FILTER INSERTION;  Surgeon: Katha Cabal, MD;  Location: [place] [facility];  Service: Cardiovascular;  Laterality: N/A;   MINOR GRAFT APPLICATION  2/80/0349   Procedure: Myriad Matrix  APPLICATION;  Surgeon: Herbert Pun, MD;  Location: [facility];  Service: General;;   NASAL SINUS SURGERY  2002   Dr Carlis Abbott   PEG PLACEMENT N/A 05/28/2021   Procedure: PERCUTANEOUS ENDOSCOPIC GASTROSTOMY (PEG) PLACEMENT;  Surgeon: Benjamine Sprague, DO;  Location: [facility];  Service: General;  Laterality: N/A;  TRAVEL CASE   TRACHEOSTOMY TUBE PLACEMENT N/A 05/17/2021   Procedure: TRACHEOSTOMY;  Surgeon: Clyde Canterbury, MD;  Location: [facility];  Service: ENT;  Laterality: N/A;    WOUND DEBRIDEMENT Left 05/07/2021   Procedure: DEBRIDEMENT WOUND;  Surgeon: Herbert Pun, MD;  Location: [facility];  Service: General;  Laterality: Left;     Social History   reports that she quit smoking about 20 years ago. Her smoking use included cigarettes. She has a 80.00 pack-year smoking history. She quit smokeless tobacco use about 20 years ago.  Her smokeless tobacco use included snuff. She reports that she does not drink alcohol and does not use drugs.   Family History   Her family history includes Alcohol abuse in her brother; Breast cancer (age of onset: 35) in her sister; Congestive Heart Failure in her mother; Coronary artery disease (age of onset: 53) in her father; Heart disease in her brother; Varicose Veins in her brother.   Allergies No Known Allergies   Home Medications  Prior to Admission medications   Medication Sig Start Date End Date Taking? Authorizing Provider  albuterol (PROVENTIL) (2.5 MG/3ML) 0.083% nebulizer solution Take 3 mLs (2.5 mg total) by nebulization every 6 (six) hours as needed for wheezing or shortness of breath. 10/03/19   Steele Sizer, MD  aspirin 81 MG tablet Take 1 tablet (81 mg total) by mouth daily. 01/08/16   Steele Sizer, MD  atorvastatin (LIPITOR) 40 MG tablet Take 1 tablet (40 mg total) by mouth daily. 02/11/21   Steele Sizer, MD  azelastine (ASTELIN) 0.1 % nasal spray Place 2 sprays into both nostrils 2 (two) times daily as needed for rhinitis or allergies. 07/31/20   Delsa Grana, PA-C  baclofen (LIORESAL) 10 MG tablet Take 10 mg by mouth daily. 04/16/21   [provider]  calcium carbonate (OSCAL) 1500 (600 Ca) MG TABS tablet Take 600 mg of elemental calcium by mouth 2 (two) times daily with a meal.    [provider]  celecoxib (CELEBREX) 200 MG capsule Take 200 mg by mouth daily. 04/03/21   [provider]  Cetirizine HCl (ZYRTEC ALLERGY) 10 MG TBDP Take 10 mg by mouth at bedtime. For sinus symptoms  and seasonal allergies 07/31/20   Delsa Grana, PA-C  docusate sodium (COLACE) 100 MG capsule Take 100 mg by mouth daily.    [provider]  fluticasone Asencion Islam) 50 MCG/ACT nasal spray  08/31/20   [provider]  Fluticasone-Umeclidin-Vilant (TRELEGY ELLIPTA) 100-62.5-25 MCG/INH AEPB Inhale 1 puff into the lungs daily. In place of Stiolto 02/10/20   Steele Sizer, MD  glucose blood (ACCU-CHEK AVIVA PLUS) test strip Use as instructed 12/08/17   Steele Sizer, MD  Lancets (ACCU-CHEK SOFT TOUCH) lancets Use as instructed 12/08/17   Steele Sizer, MD  loratadine (CLARITIN) 10 MG tablet Take 1 tablet (10 mg total) by mouth daily. 02/11/21   Steele Sizer, MD  meloxicam (MOBIC) 15 MG tablet Take 15 mg by mouth daily. 04/23/21   [provider]  metFORMIN (GLUCOPHAGE-XR) 750 MG 24 hr tablet Take 1 tablet (750 mg total) by mouth daily with breakfast. 02/11/21   Steele Sizer, MD  montelukast (SINGULAIR) 10 MG  tablet Take 1 tablet (10 mg total) by mouth at bedtime. 02/11/21   Steele Sizer, MD  Multiple Vitamins-Minerals (MULTIVITAL PO) Take 1 tablet by mouth daily.    [provider]  omeprazole (PRILOSEC) 20 MG capsule Take 1 capsule (20 mg total) by mouth daily. 02/11/21   Steele Sizer, MD  [place] 30 MG/2ML SOSY  10/09/20   [provider]  triamterene-hydrochlorothiazide (DYAZIDE) 37.5-25 MG capsule TAKE 1 CAPSULE EVERY DAY 05/02/21   Steele Sizer, MD  vitamin B-12 (CYANOCOBALAMIN) 100 MCG tablet Take 100 mcg by mouth daily.    [provider]  Scheduled Meds:  sodium chloride   Intravenous Once   sodium chloride   Intravenous Once   amiodarone  200 mg Per Tube BID   vitamin C  500 mg Per Tube BID   chlorhexidine gluconate (MEDLINE KIT)  15 mL Mouth Rinse BID   Chlorhexidine Gluconate Cloth  6 each Topical Q0600   Darbepoetin Alfa  100 mcg Subcutaneous Q7 days   escitalopram  10 mg Per Tube Daily   free water  30 mL Per Tube  Q4H   gentamicin irrigation  80 mg Irrigation QID   hydrocortisone sod succinate (SOLU-CORTEF) inj  100 mg Intravenous BID   insulin aspart  0-20 Units Subcutaneous Q4H   insulin aspart  8 Units Subcutaneous Q4H   insulin detemir  23 Units Subcutaneous BID   ipratropium-albuterol  3 mL Nebulization Q6H   magic mouthwash w/lidocaine  10 mL Oral QID   mouth rinse  15 mL Mouth Rinse 10 times per day   midodrine  15 mg Per Tube TID WC   multivitamin  1 tablet Per Tube QHS   nutrition supplement (JUVEN)  1 packet Per Tube BID BM   oxyCODONE  5 mg Per Tube Q6H   pantoprazole (PROTONIX) IV  40 mg Intravenous Q24H   sodium chloride flush  10-40 mL Intracatheter Q12H   vitamin A  10,000 Units Intramuscular Daily   zinc sulfate  220 mg Per Tube Daily   Continuous Infusions:  sodium chloride 10 mL/hr at 06/03/21 0700   albumin human Stopped (06/02/21 1219)   feeding supplement (PIVOT 1.5 CAL) 1,000 mL (06/03/21 0055)   norepinephrine (LEVOPHED) Adult infusion Stopped (06/02/21 2246)   prismasol BGK 0/2.5     PRN Meds:.sodium chloride, acetaminophen, albuterol, artificial tears, clonazePAM, docusate, HYDROmorphone (DILAUDID) injection, lip balm, ondansetron (ZOFRAN) IV, oxyCODONE, sennosides, sodium chloride flush  Assessment & Plan:   Ventilator Dependent Respiratory Failure s/p tracheostomy  Hx: COPD, OSA, and Obesity - Trach collar trials as tolerated with mechanical ventilation qhs for now  - Wean Fio2 and PEEP as tolerated - VAP/VENT bundle implementation - Wean PEEP & FiO2 as tolerated, maintain SpO2 >88% - VAP protocol in place - Plateau pressures less than 30 cm H20  - Intermittent chest x-ray & ABG PRN - Ensure adequate pulmonary hygiene  - Trach collar trials  - Per speech therapy trach will need to be downsized to size 6 when pt able to tolerate to be considered for PMV therapy   Septic shock with probable component of hypovolemia Necrotizing fascitis (strep  pyogenes)-present on admission s/p multiple debridements, application of wound VAC system, s/p myriad matrix placement 05/17/2021 Streptococcal toxic shock syndrome Pseudomonas in Tracheal wound culture Lactic acidosis - Continuous cardiac monitoring - Maintain MAP >65 - Vasopressors as needed to maintain MAP goal - IV fluid boluses, aim for positive fluid balance - Will discuss interruption of CRRT and  potential transition to HD in a few days with Nephrology - Continue scheduled midodrine at 15 mg TID  - Continue stress dose steroids due to concerns of adrenal insufficiency; will need to taper once off pressors - Completed full course of abx therapy~02/17  - General Surgery following, appreciate input ~ wound care as per Surgery  Intermittent atrial fibrillation with rvr~improving    - Continue amiodarone 200 mg bid   Acute kidney injury secondary to ischemic nephropathy in the setting of hypotension complicated with sepsis  - Avoid nephrotoxic agents - Follow urine output, BMP - Ensure adequate renal perfusion, optimize oxygenation - Renal dose medications - Nephrology consulted appreciate input~CRRT discontinued 02/19 will reassess dialysis needs daily per nephrology    Shock liver, resolving  - Monitor hepatic function panel   - Monitor coags   Acute on chronic anemia~pt unable to tolerate chemical anticoagulation s/p IVC filter placement 05/29/2021 Thrombocytopenia, likely in setting of CRRT -Monitor for S/Sx of bleeding -Trend CBC -Unable to tolerate anticoagulants or SCD's for VTE Prophylaxis ~ IVC filter placed 2/15 -Transfuse for Hgb <7  Type II diabetes mellitus  -CBG's q4h; Target range of 140 to 180 -SSI -Follow ICU Hypo/Hyperglycemia protocol    Toxic metabolic encephalopathy~improving  Small SAH  Appears Depressed NO ANTICOAGULATION FOR  1 MONTH AND THEN NEED TO RE-ASSESS NO ANTIPLATELET AGENTS FOR 1 MONTH AND THEN NEED TO RE-ASSESS CAN POSSIBLY CONSIDER BABY  ASA BUT WOULD NEED TO PROCEED WITH CAUTION -Continue Lexapro -Psychiatry consulted, appreciate input  Best practice:  Diet: Tube Feeds  Pain/Anxiety/Delirium protocol (if indicated): Scheduled and prn oxycodone  VAP protocol (if indicated): Ordered  DVT prophylaxis: Unable to tolerate chemical prophylaxis s/p IVC filter placement  GI prophylaxis: PPI Glucose control:  SSI Yes Central venous access: LIJ VasCath with pigtail; right radial a line will discontinue  Arterial line:  N/A Foley:  N/A Mobility:  OOB with PT if able  PT consulted: N/A Last date of multidisciplinary goals of care discussion [06/03/21] Code Status:  full code Disposition: ICU   = Goals of Care = Code Status Order: ICU  Primary Emergency ContactATTALLAH, ONTKO, Home Phone: [phone] Will update family once they arrive at bedside   Critical care time: 72 minutes    Donell Beers, [place] Pager [phone] (please enter 7 digits) PCCM Consult Pager [phone] (please enter 7 digits)

## 2021-06-03 NOTE — Consult Note (Signed)
°  Attempted to consult on this patient. Daughter at bedside. Patient has trach, is somnolent. Bedside RN performing ROM. [name] at bedside; is refusing psychiatry consultation, saying "We don't need it." Patient's medical team was advised.

## 2021-06-03 NOTE — TOC Progression Note (Signed)
Transition of Care [facility]) - Progression Note    Patient Details  Name: Alexandra Foster MRN: [id number] Date of Birth: 09/01/1950  Transition of [facility]) CM/SW Contact  Shelbie Hutching, RN Phone Number: 06/03/2021, 11:20 AM  Clinical Narrative:    Patient tolerating TC today, bedside RN reports patient tolerated TC all day yesterday until midnight last night.  Family at the bedside.  CRRT is off and nephrology is going to attempt hemodialysis in the next day or so.      Expected Discharge Plan: [place] Barriers to Discharge: Continued Medical Work up  Expected Discharge Plan and Services Expected Discharge Plan: [place]   Discharge Planning Services: CM Consult   Living arrangements for the past 2 months: Single Family Home                                       Social Determinants of Health (SDOH) Interventions    Readmission Risk Interventions No flowsheet data found.

## 2021-06-03 NOTE — Consult Note (Signed)
PHARMACY CONSULT NOTE  Pharmacy Consult for Electrolyte Monitoring and Replacement   Recent Labs: Potassium (mmol/L)  Date Value  06/03/2021 4.6  08/11/2013 3.3 (L)   Magnesium (mg/dL)  Date Value  06/02/2021 2.1   Calcium (mg/dL)  Date Value  06/03/2021 9.6   Calcium, Total (mg/dL)  Date Value  08/11/2013 9.2   Albumin (g/dL)  Date Value  06/03/2021 2.9 (L)  08/11/2013 3.1 (L)   Phosphorus (mg/dL)  Date Value  06/03/2021 3.3   Sodium (mmol/L)  Date Value  06/03/2021 133 (L)  08/11/2013 138   Assessment: 71 yo female with a previous medical history of diabetes mellitus type 2, hypertension, obstructive sleep apnea on CPAP, asthma/COPD, GERD, history of COVID-19 pneumonia presenting with lower left extremity pain and chills. Patient found to have severe sepsis and strep pyogenes bacteremia on admission and is currently intubated and sedated. LLL necrotizing fascitis. [facility]  Pharmacy has been consulted to monitor and replace electrolytes. CRRT stopped, planning for intermittent hemodialysis tomorrow  -CRRT stopped 2/6, re-started (2/8>> 2/20) -s/p trach 2/3, tolerating trach collar trials  Nutrition:  --Feeds per tube @65ml /hr --Free water per tube 30 mL Q4H  Goal of Therapy:  Electrolytes within normal limits  Plan:  No electrolyte replacement warranted for today Renal function panel repeat in am  Vallery Sa, PharmD, BCPS Clinical Pharmacist 06/03/2021 6:53 AM

## 2021-06-03 NOTE — Progress Notes (Signed)
Nutrition Follow Up Note   DOCUMENTATION CODES:   Morbid obesity  INTERVENTION:   Pivot 1.5_0 /hr continuous   Free water flushes 50m q4 hours to maintain tube patency   Regimen provides 2340kcal/day, 146g/day protein and 13621mday of free water   Continue Juven Fruit Punch BID via tube, each serving provides 95kcal and 2.5g of protein (amino acids glutamine and arginine)  Continue Rena-vit daily via tube   Continue Vitamin C 50038mID via tube   Continue Zinc 220m51mily via tube x 14 days  Continue Vitamin A 10,000 units daily via tube   Add NutriSource Fiber BID via tube   NUTRITION DIAGNOSIS:   Increased nutrient needs related to wound healing as evidenced by estimated needs.  GOAL:   Provide needs based on ASPEN/SCCM guidelines -met with tube feeds   MONITOR:   Labs, Weight trends, TF tolerance, Skin, I & O's, Vent status  ASSESSMENT:   70 y40. female with a history of asthma/COPD, HTN, DMII, obesity, OSA on CPAP, GERD and COVID-19 pneumonia who was admitted 1/22 w/ left LE cellulitis, septic shock, S pyogenes bacteremia, hypotension, new Afib and AKI.  Pt s/p I & D 1/24 Pt s/p I & D with veraflow placement 1/27 Pt s/p Left IJ catheter and CRRT 1/28 Pt s/p CT head 1/31; noted to have small SAH  Pt s/p tracheostomy and cellular matrix placement 2/3  Pt s/p surgically placed PEG tube 2/14 Pt s/p IVC filter 2/15  Pt on trach collar today. PEG tube in place and pt tolerating tube feeds at goal rate. Pt with rectal tube in place and continues to have diarrhea; will start some soluble fiber today. CRRT stopped yesterday; plan is for possible HD tomorrow. Will plan to check vitamin labs since CRRT discontinued. Pt is off pressors. Per chart, pt is down ~24lbs since admit but remains weight stable for the past two weeks. Pt +2.5L on her I & Os.   Medications reviewed and include: darbepoetin, solu-cortef, vitamin C, insulin, rena-vit, juven, protonix, zinc,  vitamin A, levophed, zosyn   Labs reviewed: Na 137 wnl, K 4.2 wnl, BUN 37(H), creat 1.33(H), P 3.5 wnl, Mg 2.5(H) Hgb 8.4(L), Hct 27.3(L) Cbgs- 113, 137 x 24hrs  Patient is currently intubated on ventilator support MV: 8.8 L/min Temp (24hrs), Avg:98.7 F (37.1 C), Min:98.1 F (36.7 C), Max:99.2 F (37.3 C)  Propofol: none   MAP- >65mm46m Diet Order:   Diet Order     None      EDUCATION NEEDS:   Education needs have been addressed  Skin:  Skin Assessment: Reviewed RN Assessment (40 x 35 cm left leg wound, Stage I coccyx, new wounds left arm), VAC  Last BM:  2/20- TYPE 7 (620ml 37mrectal tube)  Height:   Ht Readings from Last 1 Encounters:  05/31/21 _1  (1.651 m)    Weight:   Wt Readings from Last 1 Encounters:  06/03/21 112.6 kg    Ideal Body Weight:  56.8 kg  BMI:  Body mass index is 41.31 kg/m.  Estimated Nutritional Needs:   Kcal:  2200-2500kcal/day  Protein:  120-140 grams  Fluid:  1.7-2.0L/day  Sufian Ravi Koleen DistanceD, LDN Please refer to [facility] and/or RD on-call/weekend/after hours pager

## 2021-06-03 NOTE — Progress Notes (Signed)
Occupational Therapy Treatment Patient Details Name: Alexandra Foster MRN: [id number] DOB: 15-Jan-1951 Today's Date: 06/03/2021   History of present illness 71 y/o female with history of Asthma/COPD, T2DM, HTN, OSA on CPAP, GERD, COVID-19 pneumonia, and Bilateral lower extremity edema who presented to the ED with chief complaint of LLE pain.  Cellulitis of LLE, septic shock with LLL necrotizing fascitis post op resp failure and severe septic shock with progressive renal failure on CRRT and progressive multiorgan failure.  Pt needing vent support on trach at night, HD, tube feeds. Pt is now s/p I&D of LLE & wound vac application on 5/46/27. Per secure chat with nephrologist it is okay to see pt while on CRRT. Surgeon also clears pt for participation with WBAT LLE.   OT comments  Ms Weitz was seen for OT treatment on this date. Upon arrival to room husband and nephew at bed side, agreeable to tx. Pt opens eyes to name and intermittently to touch, nods and shakes heads inconsistently to questions - reports goal to decrease BUE swelling . Follows ~25% of commands t/o session.   Pt completed simulated hand to face patterns with MAX A, OT and husband performed with MIN cues for technique for family. Family instructed in PROM HEP. Pt tolerated BUE and RLE therex as described below. Pt making progress toward goals. Pt continues to benefit from skilled OT services to maximize return to PLOF and minimize risk of future falls, injury, caregiver burden, and readmission. Will continue to follow POC. Discharge recommendation remains appropriate.     Recommendations for follow up therapy are one component of a multi-disciplinary discharge planning process, led by the attending physician.  Recommendations may be updated based on patient status, additional functional criteria and insurance authorization.    Follow Up Recommendations  OT at Long-term acute care hospital    Assistance Recommended at Discharge  Frequent or constant Supervision/Assistance  Patient can return home with the following  Two people to help with bathing/dressing/bathroom;Two people to help with walking and/or transfers;Assistance with cooking/housework;Assist for transportation;Help with stairs or ramp for entrance;Assistance with feeding;Direct supervision/assist for financial management;Direct supervision/assist for medications management   Equipment Recommendations  Other (comment)    Recommendations for Other Services      Precautions / Restrictions Precautions Precautions: Fall Precaution Comments: L jugular temp dialysis catheter, wounds under most skin folds, LLE wound vac, ventilator via tracheostomy, rectal tube, catheter       Mobility Bed Mobility                    Transfers                             ADL either performed or assessed with clinical judgement   ADL Overall ADL's : Needs assistance/impaired                                       General ADL Comments: Simulated hand to face patterns with MAX A. Family instructed in PROM HEP     Cognition Arousal/Alertness: Awake/alert, Lethargic Behavior During Therapy: Flat affect Overall Cognitive Status: Difficult to assess                                 General Comments: Husband and nephew in room. Pt opens  eyes to name and intermittently to touch. Follows ~25% of commands.        Exercises Exercises: General Upper Extremity, General Lower Extremity General Exercises - Upper Extremity Shoulder Flexion: PROM, AAROM, Both, 20 reps, Supine Shoulder ABduction: PROM, AAROM, Both, 20 reps, Supine Shoulder Horizontal ABduction: PROM, AAROM, Both, 20 reps, Supine Shoulder Horizontal ADduction: PROM, AAROM, Both, 20 reps, Supine Elbow Flexion: PROM, AAROM, Both, 20 reps, Supine Elbow Extension: PROM, AAROM, Both, 20 reps, Supine Wrist Flexion: PROM, AAROM, Both, 20 reps, Supine Wrist  Extension: PROM, AAROM, Both, 20 reps, Supine Digit Composite Flexion: PROM, AAROM, Both, 20 reps, Supine General Exercises - Lower Extremity Ankle Circles/Pumps: PROM, AAROM, Both, 20 reps, Supine     Pertinent Vitals/ Pain       Pain Assessment Pain Assessment: Faces Faces Pain Scale: No hurt   Frequency  Min 2X/week        Progress Toward Goals  OT Goals(current goals can now be found in the care plan section)  Progress towards OT goals: Progressing toward goals  Acute Rehab OT Goals Patient Stated Goal: decrease swelling to improve pain OT Goal Formulation: With patient/family Time For Goal Achievement: 06/08/21 Potential to Achieve Goals: Fair ADL Goals Pt Will Perform Grooming: bed level;with mod assist Pt/caregiver will Perform Home Exercise Program: Increased strength;Both right and left upper extremity;With written HEP provided Additional ADL Goal #1: Pt will participate in bed mobility requiring MAX A +1 person for bed level ADL and repositioning.  Plan Discharge plan remains appropriate;Frequency remains appropriate    Co-evaluation                 AM-PAC OT "6 Clicks" Daily Activity     Outcome Measure   Help from another person eating meals?: A Lot Help from another person taking care of personal grooming?: A Lot Help from another person toileting, which includes using toliet, bedpan, or urinal?: Total Help from another person bathing (including washing, rinsing, drying)?: Total Help from another person to put on and taking off regular upper body clothing?: Total Help from another person to put on and taking off regular lower body clothing?: Total 6 Click Score: 8    End of Session    OT Visit Diagnosis: Other abnormalities of gait and mobility (R26.89);Muscle weakness (generalized) (M62.81);Pain Pain - Right/Left: Left Pain - part of body: Leg   Activity Tolerance Patient limited by fatigue   Patient Left in bed;with call bell/phone  within reach;with family/visitor present   Nurse Communication          Time: 9937-1696 OT Time Calculation (min): 18 min  Charges: OT General Charges $OT Visit: 1 Visit OT Treatments $Therapeutic Exercise: 8-22 mins  Dessie Coma, M.S. OTR/L  06/03/21, 4:10 PM  ascom [phone]

## 2021-06-03 NOTE — Progress Notes (Signed)
[facility]  PROGRESS NOTE   Subjective:   She has been liberated from the ventilator. We also stopped CRRT yesterday. We are planning for intermittent hemodialysis tomorrow. Still anuric however.   02/19 0701 - 02/20 0700 In: 4595.9 [I.V.:163.6; Blood:440; TM/HD:6222; IV Piggyback:2087.3] Out: 9798 [Drains:200; Stool:620]    Objective:  Vital signs in last 24 hours:  Temp:  [98.1 F (36.7 C)-99.2 F (37.3 C)] 98.6 F (37 C) (02/20 0500) Pulse Rate:  [74-94] 92 (02/20 0900) Resp:  [14-23] 18 (02/20 0900) BP: (105-159)/(49-112) 121/56 (02/20 0900) SpO2:  [94 %-100 %] 98 % (02/20 0936) Arterial Line BP: (86-157)/(43-82) 112/50 (02/20 0700) FiO2 (%):  [21 %-28 %] 28 % (02/20 0936) Weight:  [112.6 kg] 112.6 kg (02/20 0500)  Weight change:  Filed Weights   05/31/21 0500 06/01/21 0500 06/03/21 0500  Weight: 111.7 kg 111.4 kg 112.6 kg    Intake/Output: I/O last 3 completed shifts: In: 4768.8 [I.V.:270.5; Blood:440; Other:50; NG/GT:1920; IV Piggyback:2088.3] Out: 2694 [Drains:200; XQJJH:4174; Stool:620]   Intake/Output this shift:  No intake/output data recorded.  Physical Exam: General:  Critically ill  Head:  [place]/AT, OM moist  Eyes:  Anicteric  Neck:  tracheostomy in place  Lungs:   diminished bilaterally, normal effort  Heart:  regular  Abdomen:   Soft, nontender  Extremities:  Left lower extremity wound vac 1+ right lower extremity edema  Neurologic:  Awake, alert  Skin:  No lesions  Access:  Left IJ temp HD catheter    Basic Metabolic Panel: Recent Labs  Lab 05/30/21 0430 05/30/21 1532 05/31/21 0306 05/31/21 1624 06/01/21 0423 06/01/21 1510 06/02/21 0503 06/03/21 0534  NA 137   < > 137 136 136 134* 133* 133*  K 4.5   < > 5.2* 4.8 4.4 4.1 4.2 4.6  CL 104   < > 104 102 102 100 101 101  CO2 26   < > '26 24 24 24 24 25  ' GLUCOSE 209*   < > 301* 235* 252* 162* 193* 161*  BUN 36*   < > 40* 47* 51* 61* 56* 77*  CREATININE 1.27*   < > 1.45*  1.32* 1.39* 1.36* 1.31* 1.83*  CALCIUM 9.0   < > 8.4* 9.2 9.5 9.9 9.3 9.6  MG 2.6*  --  2.5*  --  2.2  --  2.1 2.0  PHOS 3.1   < > 2.9 2.8 2.4* 2.2* 2.6 3.3   < > = values in this interval not displayed.     CBC: Recent Labs  Lab 05/30/21 0430 05/31/21 0306 06/01/21 0423 06/01/21 1013 06/02/21 0503 06/03/21 0534  WBC 9.4 10.8* 10.5  --  9.7 9.5  NEUTROABS 8.7* 9.9* 9.4*  --  8.3* 8.5*  HGB 8.7* 8.0* 6.4* 7.5* 6.9* 7.9*  HCT 28.1* 27.0* 22.2* 25.1* 22.9* 25.5*  MCV 94.6 96.8 98.2  --  95.0 92.1  PLT 199 173 140*  --  146* 158      Urinalysis: No results for input(s): COLORURINE, LABSPEC, PHURINE, GLUCOSEU, HGBUR, BILIRUBINUR, KETONESUR, PROTEINUR, UROBILINOGEN, NITRITE, LEUKOCYTESUR in the last 72 hours.  Invalid input(s): APPERANCEUR    Imaging: DG Chest Port 1 View  Result Date: 06/01/2021 CLINICAL DATA:  Central line placement EXAM: PORTABLE CHEST 1 VIEW COMPARISON:  Radiograph 05/31/2021 FINDINGS: Tracheostomy tube overlies the midthoracic trachea. Right neck catheter tip overlies the right atrium. Left neck catheter tip overlies the region of the distal superior vena cava. Bibasilar opacities. No visible pneumothorax. No large effusion. No acute  osseous abnormality. IMPRESSION: Left neck central venous catheter tip overlies the distal SVC. Right neck catheter tip overlies the right atrium. Bibasilar opacities, likely atelectasis.  No visible pneumothorax. Electronically Signed   By: Maurine Simmering M.D.   On: 06/01/2021 15:43     Medications:    sodium chloride 10 mL/hr at 06/03/21 0700   albumin human 25 g (06/03/21 0903)   feeding supplement (PIVOT 1.5 CAL) 1,000 mL (06/03/21 0055)   norepinephrine (LEVOPHED) Adult infusion Stopped (06/02/21 2246)   prismasol BGK 0/2.5      sodium chloride   Intravenous Once   sodium chloride   Intravenous Once   amiodarone  200 mg Per Tube BID   vitamin C  500 mg Per Tube BID   chlorhexidine gluconate (MEDLINE KIT)  15 mL Mouth  Rinse BID   Chlorhexidine Gluconate Cloth  6 each Topical Q0600   Darbepoetin Alfa  100 mcg Subcutaneous Q7 days   escitalopram  10 mg Per Tube Daily   free water  30 mL Per Tube Q4H   gentamicin irrigation  80 mg Irrigation QID   hydrocortisone sod succinate (SOLU-CORTEF) inj  100 mg Intravenous BID   insulin aspart  0-20 Units Subcutaneous Q4H   insulin aspart  8 Units Subcutaneous Q4H   insulin detemir  23 Units Subcutaneous BID   ipratropium-albuterol  3 mL Nebulization Q6H   magic mouthwash w/lidocaine  10 mL Oral QID   mouth rinse  15 mL Mouth Rinse 10 times per day   midodrine  15 mg Per Tube TID WC   multivitamin  1 tablet Per Tube QHS   nutrition supplement (JUVEN)  1 packet Per Tube BID BM   oxyCODONE  5 mg Per Tube Q6H   pantoprazole (PROTONIX) IV  40 mg Intravenous Q24H   sodium chloride flush  10-40 mL Intracatheter Q12H   vitamin A  10,000 Units Intramuscular Daily   zinc sulfate  220 mg Per Tube Daily    Assessment/ Plan:     Principal Problem:   Severe sepsis with septic shock (CODE) (HCC) Active Problems:   COPD (chronic obstructive pulmonary disease) (HCC)   Morbid obesity (HCC)   Diabetes mellitus type 2 in obese (HCC)   Atrial fibrillation and flutter ([place])   Necrotizing fasciitis ([place])   Acute on chronic respiratory failure ([place])   On mechanically assisted ventilation (HCC)   Shock liver   Anemia associated with acute blood loss   Metabolic acidosis   Encounter for continuous renal replacement therapy (CRRT) for acute renal failure (HCC)   Acute encephalopathy   Group A streptococcal infection   Streptococcal toxic shock syndrome ([place])   Status post tracheostomy ([place])   Subdural hemorrhage ([place])   Left leg cellulitis  Ms. Waneta Fitting Quest is a 71 y.o. black female with COPD/asthma, diabetes mellitus type II, hypertension, obstructive sleep apnea, paroxysmal atrial fibrillation, GERD, who presents to [facility] 05[facility]04[facility]2021 for AKI (acute kidney injury)  ([place]) [N17.9] Left leg cellulitis [L03.116] Severe sepsis ([place]) [A41.9, R65.20] Severe sepsis with lactic acidosis ([place]) [A41.9, R65.20, E87.20] Found to have necrotizing fascitis requiring multiple debridements by Dr. Peyton Najjar: 1/24. 1/27, 2/3 and 2/10.    #1: Acute kidney injury: AKI is secondary to ischemic nephropathy secondary to hypotension complicated with sepsis. History of bland urine. Baseline creatinine of 0.98, GFR > 60 on 02/11/21.  -Patient remains anuric at this time.  However currently off of pressors.  We will hold off on restarting dialysis.  We will plan for intermittent hemodialysis treatment again tomorrow.  Hopefully she is able to tolerate this well otherwise we may need to revert back to CRRT.    #2: Sepsis and hypotension: necrotizing fasciitis for left lower extremity status post multiple debridement.  Requiring vasopressors: norepinephrine. On stress dose steroids and midodrine -Currently off of norepinephrine.  #3. Anemia with Acute kidney injury:  Lab Results  Component Value Date   HGB 7.9 (L) 06/03/2021    S/p multiple blood transfusions this admission Aranesp weekly SQ (mon) Aranesp to be administered today.    #4. Acute Respiratory Failure.  Patient currently off ventilatory support.  Breathing through T-piece.    LOS: 62 Marieclaire Bettenhausen Holley Raring, [facility] 2/20/202310:07 AM

## 2021-06-03 NOTE — Progress Notes (Signed)
°   06/03/21 1200  Clinical Encounter Type  Visited With Patient and family together  Visit Type Follow-up;Spiritual support   Chaplain followed up with family as the patient changed rooms. Progress is slow. Chaplain continues to provide support for the family

## 2021-06-04 DIAGNOSIS — J9620 Acute and chronic respiratory failure, unspecified whether with hypoxia or hypercapnia: Secondary | ICD-10-CM | POA: Diagnosis not present

## 2021-06-04 DIAGNOSIS — J9621 Acute and chronic respiratory failure with hypoxia: Secondary | ICD-10-CM | POA: Diagnosis not present

## 2021-06-04 DIAGNOSIS — D62 Acute posthemorrhagic anemia: Secondary | ICD-10-CM | POA: Diagnosis not present

## 2021-06-04 DIAGNOSIS — R140 Abdominal distension (gaseous): Secondary | ICD-10-CM

## 2021-06-04 LAB — CBC WITH DIFFERENTIAL/PLATELET
Abs Immature Granulocytes: 0.14 10*3/uL — ABNORMAL HIGH (ref 0.00–0.07)
Basophils Absolute: 0 10*3/uL (ref 0.0–0.1)
Basophils Relative: 0 %
Eosinophils Absolute: 0 10*3/uL (ref 0.0–0.5)
Eosinophils Relative: 0 %
HCT: 24.2 % — ABNORMAL LOW (ref 36.0–46.0)
Hemoglobin: 7.4 g/dL — ABNORMAL LOW (ref 12.0–15.0)
Immature Granulocytes: 2 %
Lymphocytes Relative: 5 %
Lymphs Abs: 0.4 10*3/uL — ABNORMAL LOW (ref 0.7–4.0)
MCH: 27.9 pg (ref 26.0–34.0)
MCHC: 30.6 g/dL (ref 30.0–36.0)
MCV: 91.3 fL (ref 80.0–100.0)
Monocytes Absolute: 0.5 10*3/uL (ref 0.1–1.0)
Monocytes Relative: 6 %
Neutro Abs: 6.7 10*3/uL (ref 1.7–7.7)
Neutrophils Relative %: 87 %
Platelets: 198 10*3/uL (ref 150–400)
RBC: 2.65 MIL/uL — ABNORMAL LOW (ref 3.87–5.11)
RDW: 19 % — ABNORMAL HIGH (ref 11.5–15.5)
WBC: 7.7 10*3/uL (ref 4.0–10.5)
nRBC: 0.8 % — ABNORMAL HIGH (ref 0.0–0.2)

## 2021-06-04 LAB — VITAMIN D 25 HYDROXY (VIT D DEFICIENCY, FRACTURES): Vit D, 25-Hydroxy: 16.13 ng/mL — ABNORMAL LOW (ref 30–100)

## 2021-06-04 LAB — GLUCOSE, CAPILLARY
Glucose-Capillary: 113 mg/dL — ABNORMAL HIGH (ref 70–99)
Glucose-Capillary: 131 mg/dL — ABNORMAL HIGH (ref 70–99)
Glucose-Capillary: 158 mg/dL — ABNORMAL HIGH (ref 70–99)
Glucose-Capillary: 158 mg/dL — ABNORMAL HIGH (ref 70–99)
Glucose-Capillary: 172 mg/dL — ABNORMAL HIGH (ref 70–99)
Glucose-Capillary: 175 mg/dL — ABNORMAL HIGH (ref 70–99)

## 2021-06-04 LAB — RENAL FUNCTION PANEL
Albumin: 2.8 g/dL — ABNORMAL LOW (ref 3.5–5.0)
Anion gap: 13 (ref 5–15)
BUN: 98 mg/dL — ABNORMAL HIGH (ref 8–23)
CO2: 21 mmol/L — ABNORMAL LOW (ref 22–32)
Calcium: 9.4 mg/dL (ref 8.9–10.3)
Chloride: 98 mmol/L (ref 98–111)
Creatinine, Ser: 2.86 mg/dL — ABNORMAL HIGH (ref 0.44–1.00)
GFR, Estimated: 17 mL/min — ABNORMAL LOW (ref >60)
Glucose, Bld: 189 mg/dL — ABNORMAL HIGH (ref 70–99)
Phosphorus: 4.6 mg/dL (ref 2.5–4.6)
Potassium: 6 mmol/L — ABNORMAL HIGH (ref 3.5–5.1)
Sodium: 132 mmol/L — ABNORMAL LOW (ref 135–145)

## 2021-06-04 LAB — FECAL FAT, QUALITATIVE
Fat Qual Neutral, Stl: NORMAL
Fat Qual Total, Stl: NORMAL

## 2021-06-04 LAB — MAGNESIUM: Magnesium: 2.2 mg/dL (ref 1.7–2.4)

## 2021-06-04 MED ORDER — VALACYCLOVIR HCL 500 MG PO TABS
1000.0000 mg | ORAL_TABLET | Freq: Every day | ORAL | Status: DC
Start: 2021-06-04 — End: 2021-06-05
  Administered 2021-06-04: 1000 mg
  Filled 2021-06-04 (×2): qty 2

## 2021-06-04 MED ORDER — HEPARIN SODIUM (PORCINE) 1000 UNIT/ML IJ SOLN
INTRAMUSCULAR | Status: AC
  Filled 2021-06-04: qty 10

## 2021-06-04 MED ORDER — NUTRISOURCE FIBER PO PACK
1.0000 | PACK | Freq: Three times a day (TID) | ORAL | Status: DC
  Administered 2021-06-04 – 2021-06-06 (×6): 1
  Filled 2021-06-04 (×8): qty 1

## 2021-06-04 NOTE — Progress Notes (Signed)
[facility]  PROGRESS NOTE   Subjective:   Renal parameters worse since she has been off CRRT. Potassium up to 6.0. BUN up to 98 and creatinine up to 2.86. Serum sodium slightly lower at 132 as well. Hemoglobin also lower at 7.4.   02/20 0701 - 02/21 0700 In: 1700.5 [I.V.:20.5; NG/GT:1580; IV Piggyback:100] Out: 570 [Drains:150; Stool:420]    Objective:  Vital signs in last 24 hours:  Temp:  [98.8 F (37.1 C)-99.1 F (37.3 C)] 98.8 F (37.1 C) (02/21 0400) Pulse Rate:  [77-98] 93 (02/21 0716) Resp:  [14-23] 17 (02/21 0716) BP: (101-147)/(45-79) 120/55 (02/21 0600) SpO2:  [97 %-100 %] 97 % (02/21 0716) FiO2 (%):  [28 %] 28 % (02/21 0716) Weight:  [112.7 kg] 112.7 kg (02/21 0500)  Weight change: 0.1 kg Filed Weights   06/01/21 0500 06/03/21 0500 06/04/21 0500  Weight: 111.4 kg 112.6 kg 112.7 kg    Intake/Output: I/O last 3 completed shifts: In: 2737.4 [I.V.:137.4; Other:50; NG/GT:2450; IV Piggyback:100] Out: 1150 [Drains:310; Stool:840]   Intake/Output this shift:  No intake/output data recorded.  Physical Exam: General:  Critically ill  Head:  [place]/AT, OM moist  Eyes:  Anicteric  Neck:  tracheostomy in place  Lungs:   diminished bilaterally, normal effort  Heart:  regular  Abdomen:   Soft, nontender  Extremities:  Left lower extremity wound vac 1+ right lower extremity edema  Neurologic:  Awake, alert  Skin:  No lesions  Access:  Left IJ temp HD catheter    Basic Metabolic Panel: Recent Labs  Lab 05/31/21 0306 05/31/21 1624 06/01/21 0423 06/01/21 1510 06/02/21 0503 06/03/21 0534 06/04/21 0438  NA 137   < > 136 134* 133* 133* 132*  K 5.2*   < > 4.4 4.1 4.2 4.6 6.0*  CL 104   < > 102 100 101 101 98  CO2 26   < > '24 24 24 25 ' 21*  GLUCOSE 301*   < > 252* 162* 193* 161* 189*  BUN 40*   < > 51* 61* 56* 77* 98*  CREATININE 1.45*   < > 1.39* 1.36* 1.31* 1.83* 2.86*  CALCIUM 8.4*   < > 9.5 9.9 9.3 9.6 9.4  MG 2.5*  --  2.2  --  2.1 2.0  2.2  PHOS 2.9   < > 2.4* 2.2* 2.6 3.3 4.6   < > = values in this interval not displayed.     CBC: Recent Labs  Lab 05/31/21 0306 06/01/21 0423 06/01/21 1013 06/02/21 0503 06/03/21 0534 06/04/21 0438  WBC 10.8* 10.5  --  9.7 9.5 7.7  NEUTROABS 9.9* 9.4*  --  8.3* 8.5* 6.7  HGB 8.0* 6.4* 7.5* 6.9* 7.9* 7.4*  HCT 27.0* 22.2* 25.1* 22.9* 25.5* 24.2*  MCV 96.8 98.2  --  95.0 92.1 91.3  PLT 173 140*  --  146* 158 198      Urinalysis: No results for input(s): COLORURINE, LABSPEC, PHURINE, GLUCOSEU, HGBUR, BILIRUBINUR, KETONESUR, PROTEINUR, UROBILINOGEN, NITRITE, LEUKOCYTESUR in the last 72 hours.  Invalid input(s): APPERANCEUR    Imaging: No results found.   Medications:    sodium chloride Stopped (06/03/21 0902)   albumin human 10 mL/hr at 06/04/21 0400   feeding supplement (PIVOT 1.5 CAL) 1,000 mL (06/04/21 0643)   norepinephrine (LEVOPHED) Adult infusion Stopped (06/02/21 2246)   prismasol BGK 0/2.5      sodium chloride   Intravenous Once   sodium chloride   Intravenous Once   amiodarone  200 mg  Per Tube BID   vitamin C  500 mg Per Tube BID   chlorhexidine gluconate (MEDLINE KIT)  15 mL Mouth Rinse BID   Chlorhexidine Gluconate Cloth  6 each Topical Q0600   Darbepoetin Alfa  100 mcg Subcutaneous Q7 days   escitalopram  10 mg Per Tube Daily   fiber  1 packet Per Tube BID   free water  30 mL Per Tube Q4H   gentamicin irrigation  80 mg Irrigation QID   hydrocortisone sod succinate (SOLU-CORTEF) inj  100 mg Intravenous BID   insulin aspart  0-20 Units Subcutaneous Q4H   insulin aspart  8 Units Subcutaneous Q4H   insulin detemir  23 Units Subcutaneous BID   ipratropium-albuterol  3 mL Nebulization Q6H   magic mouthwash w/lidocaine  10 mL Oral QID   mouth rinse  15 mL Mouth Rinse 10 times per day   midodrine  15 mg Per Tube TID WC   multivitamin  1 tablet Per Tube QHS   nutrition supplement (JUVEN)  1 packet Per Tube BID BM   oxyCODONE  5 mg Per Tube Q6H    pantoprazole (PROTONIX) IV  40 mg Intravenous Q24H   sodium chloride flush  10-40 mL Intracatheter Q12H   vitamin A  10,000 Units Intramuscular Daily   zinc sulfate  220 mg Per Tube Daily    Assessment/ Plan:     Principal Problem:   Severe sepsis with septic shock (CODE) (HCC) Active Problems:   COPD (chronic obstructive pulmonary disease) (HCC)   Morbid obesity (HCC)   Diabetes mellitus type 2 in obese (HCC)   Atrial fibrillation and flutter (HCC)   Necrotizing fasciitis ([place])   Acute on chronic respiratory failure ([place])   On mechanically assisted ventilation (HCC)   Shock liver   Anemia associated with acute blood loss   Metabolic acidosis   Encounter for continuous renal replacement therapy (CRRT) for acute renal failure (HCC)   Acute encephalopathy   Group A streptococcal infection   Streptococcal toxic shock syndrome ([place])   Status post tracheostomy ([place])   Subdural hemorrhage ([place])   Left leg cellulitis  [name] is a 71 y.o. black female with COPD/asthma, diabetes mellitus type II, hypertension, obstructive sleep apnea, paroxysmal atrial fibrillation, GERD, who presents to [facility] on 05/04/2021 for AKI (acute kidney injury) ([place]) [N17.9] Left leg cellulitis [L03.116] Severe sepsis ([place]) [A41.9, R65.20] Severe sepsis with lactic acidosis ([place]) [A41.9, R65.20, E87.20] Found to have necrotizing fascitis requiring multiple debridements by Dr. Peyton Najjar: 1/24. 1/27, 2/3 and 2/10.    #1: Acute kidney injury: AKI is secondary to ischemic nephropathy secondary to hypotension complicated with sepsis. History of bland urine. Baseline creatinine of 0.98, GFR > 60 on 02/11/21.  -Patient still without any urine output.  BUN worse at 98 with creatinine up to 2.86.  Potassium also high at 6.0.  We will plan for hemodialysis treatment today.  Hopefully she will tolerate this well if not we may need to revert back to CRRT.    #2: Sepsis and hypotension: necrotizing fasciitis  for left lower extremity status post multiple debridement.  Requiring vasopressors: norepinephrine. On stress dose steroids and midodrine -Patient remains off norepinephrine.  Maintain the patient on midodrine.  #3. Anemia with Acute kidney injury:  Lab Results  Component Value Date   HGB 7.4 (L) 06/04/2021    S/p multiple blood transfusions this admission Aranesp weekly SQ (mon) Hemoglobin is drifted down to 7.4.  Consider blood transfusion today.  #  4. Acute Respiratory Failure.  Patient currently off ventilatory support.  Breathing through T-piece. Respiratory status appears stable.     LOS: 26 Randa Riss, [facility] 2/21/20237:59 AM

## 2021-06-04 NOTE — Progress Notes (Signed)
Patient ID: Alexandra Foster, female   DOB: 02-05-1951, 71 y.o.   MRN: [id number]     [place] [facility] Day(s): 31.   Interval History: Patient seen and examined, no acute events or new complaints overnight.  As per nurse there has been no complain.  No issues with the negative pressure dressing.  Vital signs in last 24 hours: [min-max] current  Temp:  [98.5 F (36.9 C)-100.8 F (38.2 C)] 98.5 F (36.9 C) (02/21 1204) Pulse Rate:  [77-96] 91 (02/21 1431) Resp:  [14-30] 20 (02/21 1431) BP: (101-140)/(45-79) 110/54 (02/21 1415) SpO2:  [95 %-100 %] 97 % (02/21 1431) FiO2 (%):  [28 %] 28 % (02/21 1200) Weight:  [112.7 kg-121.8 kg] 121.8 kg (02/21 1137)     Height: '5\' 5"'  (165.1 cm) Weight: 121.8 kg BMI (Calculated): 44.68   Physical Exam:  Constitutional: alert, cooperative and no distress  Extremity: Left leg muscular tissue with adequate granulation tissue.  There is no ischemia or purulence.  The myriad matrix did not do incorporated.       Labs:  CBC Latest Ref Rng & Units 06/04/2021 06/03/2021 06/02/2021  WBC 4.0 - 10.5 K/uL 7.7 9.5 9.7  Hemoglobin 12.0 - 15.0 g/dL 7.4(L) 7.9(L) 6.9(L)  Hematocrit 36.0 - 46.0 % 24.2(L) 25.5(L) 22.9(L)  Platelets 150 - 400 K/uL 198 158 146(L)   CMP Latest Ref Rng & Units 06/04/2021 06/03/2021 06/02/2021  Glucose 70 - 99 mg/dL 189(H) 161(H) 193(H)  BUN 8 - 23 mg/dL 98(H) 77(H) 56(H)  Creatinine 0.44 - 1.00 mg/dL 2.86(H) 1.83(H) 1.31(H)  Sodium 135 - 145 mmol/L 132(L) 133(L) 133(L)  Potassium 3.5 - 5.1 mmol/L 6.0(H) 4.6 4.2  Chloride 98 - 111 mmol/L 98 101 101  CO2 22 - 32 mmol/L 21(L) 25 24  Calcium 8.9 - 10.3 mg/dL 9.4 9.6 9.3  Total Protein 6.5 - 8.1 g/dL - - -  Total Bilirubin 0.3 - 1.2 mg/dL - - -  Alkaline Phos 38 - 126 U/L - - -  AST 15 - 41 U/L - - -  ALT 0 - 44 U/L - - -    Imaging studies: No new pertinent imaging studies   Assessment/Plan:  71 y.o. female with left leg necrotizing fasciitis with 27 Day Post-Op  s/p debridement, complicated by pertinent comorbidities including strep pyogenes bacteremia now in septic shock (resolved), respiratory failure mechanical ventilation (resolved), liver failure (resolved), acute renal failure on CRRT, COPD, type 2 diabetes mellitus, hypertension, obstructive sleep apnea, GERD   Left leg necrotizing fasciitis -S/p debridement 05/07/2021 -Second debridement on 05/10/2021.  Application of wound VAC system -S/p myriad matrix placement on 05/17/2021 -The [place] matrix did not incorporated. Most of the matrix removed today.  -I personally placed the negative pressure dressing (DME) today -I will continue to follow along.  Arnold Long, MD

## 2021-06-04 NOTE — Progress Notes (Signed)
NAME:  Alexandra Foster, MRN:  [id number], DOB:  Jan 26, 1951, LOS: 72 ADMISSION DATE:  05/04/2021, CONSULTATION DATE:  05/04/2021 REFERRING MD:  Duffy Bruce MD CHIEF COMPLAINT:  Left leg pain   SYNOPSIS  71 y.o female with medical history significant of Asthma/COPD, T2DM, HTN, OSA on CPAP, GERD, COVID-19 pneumonia, and Bilateral lower extremity edema who presented to the ED with chief complaints of LLE pain and chills since Thursday.  Patient report onset of symptoms for the past several days with worsening pain, swelling, fevers and chills, poor po intake and difficulty walking since Thursday. Denies injury to extremity or hx of cellulitis.  ED Course: In the emergency department, the temperature was 37.7C, the heart rate 103 beats/minute, the blood pressure 75/64  mm Hg, the respiratory rate 27 breaths/minute, and the oxygen saturation 99% on RA. CT of left lower leg showed diffuse subcutaneous edema concerning for cellulitis with no fluid collection/abscess. Pertinent Labs in Red/Diagnostics Findings: Na+/ K+: 141/2.9 Glucose: 144 BUN/Cr.: 44/3.47 AST/ALT:67/48   WBC/ TMAX: 16.5/ afebrile PCT: pending Lactic acid: 3.8>6.0 COVID PCR: Negative  ADMITTED FOR SEPTIC SHOCK  Past Medical History    Arthritis   Asthma   COPD (chronic obstructive pulmonary disease) ([place])   Diabetes mellitus without complication ([place])   Endometriosis   GERD (gastroesophageal reflux disease)   Hypertension   Obesity   Sleep apnea    CPAP   Significant Hospital Events   1/21: Admitted to the ICU with severe sepsis with shock secondary to cellulitis of LLE 1/23: off pressors +Afib with RVR 1/24: OR for fasciotomy 1/25: post op resp failure with severe septic shock 1/26: remains on pressors 1/27: returns to the OR today for debridement, wound VAC 1/28: overnight with hemodynamic deterioration, required reintubation, currently on pressors 1/29: remains on 3 pressors, CRRT, wound VAC and  minimal sedation.  Awakens to voice and touch.  Seen by vascular surgery and awaiting input from general surgery regarding amputation of left leg 2/1: remains on pressors, on CRRT, MRI/CT shows SAH, afib with RVR, ECHO pending, restarted AMIO at 60 mg/hr 2/3: remains on crrt, remains on vent, plan for [facility] and wound vac change in OR 2/3: S/P TRACH, omplantation of cellular matrix on left leg 2/4: REMAINS ON PRESSORS, REMAINS ON VENT, REMAINS ON CRRT 2/5: REMAINS ON VENT, OFF PRESSORS, REMAINS ON CRRT 2/6: Patient is improved she is able to follow communication verbally and move extermities. She is on 5L/min Trache collar. We have stopped levophed and working on reducing vasopressin. She remains on CRRT.  Insulin infusion is ongoing and plan to have weight based regimen initiated. Additionally she will have SLP for vocalization and PMV trial as well as ROM training with OT/PT.  Multiple specialists on case appreciate everyone involved.  2/7: patient is improved mildly , she's off CRRT, shes off insulin drip. She is mentating.  She was evaluated by [facility].  She was unable to vocalize with PMV.  She may need PEG tube for nourishment.  2/8: patient is overnight worsened with fever and increased levophed dose.  She is negative 1.7L.   She is on zosyn.  2/9: patient is improved this am. She is giving thumbs up during examination. 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 She seems volume depleted as well so we will start CVP monitoring and give IV LR 1L bolus today to check for volume responsiveness. Albumin also while on CRRT.  PT/OT starting today. Type and screen repeat due to plan for OR in am. On zosyn with ID following. Starting [place] heparin today.  2/10: patient is s/p repeat surgery on RLE. She is on levophed 6mg post surgery. She is not tachycardic and able to follow some verbal communication.  2/11:  patient stable overnight. Reordering PT/OT today. Wbc count slightly trending up remains on abx probably post surgical.  Remains severely anemic on CRRT and vasopressor support with tracheostomy.  2/12: patient with severe anemia today.  Reviewed with renal team for blood transfusion today.  2/13: remains critically ill, on pressors and CRRT 2/13: trach changed for a #8 cuffed distal XLT without difficulty. Wound cleaner. PER ENT 2/14: remains on pressors, CRRT, VENT, plan for PEG and WOUND vac change at bedside; received 1 unit of PRBC's due to anemia  2/15: s/p IVC filter placement due to inability to tolerate anticoagulation  2/16: plan for Trach collar trials as tolerated.  Continues on CRRT. Pt refusing nursing care and to work with PT/OT, Palliative Care re-consulted 2/17: Pt seems depressed, refusing nursing care.  Consult Psychiatry. 2/18: Dialysis filter continues to clot requiring filter changes with inability to return blood back.  Hgb 6.4 pt received 1 unit pRBC's.  Increased amiodarone frequency from 200 mg daily to bid due to pt converting in and out of atrial fibrillation with rvr 2/19: CRRT discontinued due to frequent clotting of circuit resulting in acute blood loss anemia due to inability to return blood back to pt  2/20: Pt currently off of vasopressors following iv fluid resuscitation  2/21 TCT for 30 hrs,    Consults:  PCCM General Surgery  Nephrology ID  Speech  ENT   Significant Diagnostic Tests:  1/21: Chest Xray> no active cardiopulmonary process 1/21: CT left lower leg>Diffuse subcutaneous edema may represent cellulitis. No drainable fluid collection/abscess 1/21: Ultrasound lower unilateral left> no DVT  Micro Data:  1/21: SARS-CoV-2 PCR>>negative 1/21: Influenza PCR>>negative 1/21: Blood culture x2>>group A strep (strep pyogenes) 1/21: Urine Culture>negative  1/21: MRSA PCR>>negative  1/24: Left leg wound culture>>negative  2/7: TLurline [place] would  culture>>few pseudomonas aeruginosa and few candida albicans  2/8: MRSA PCR>>negative  Antimicrobials:  Vancomycin 1/21>>1/22 Cefepime 1/21>>01/22 Clindamycin 1/22>>1/25 Ceftriaxone 1/21 X1; restarted 1/31>>2/6 Penicillin G 1/22>>1/31 Linezolid 1/25>>1/31 Metronidazole 02/6 x1 dose Unasyn 2/7>>2/7 Zosyn 2/7>>2/17  INTERVAL CHANGES  Off pressors Plan for HD today Patient on TCT Slight increased WOB Refuses all care   OBJECTIVE  Blood pressure (!) 120/55, pulse 93, temperature 98.8 F (37.1 C), temperature source Axillary, resp. rate 17, height '5\' 5"'  (1.651 m), weight 112.7 kg, SpO2 97 %.    Vent Mode: PSV;CPAP FiO2 (%):  [21 %-28 %] 28 % PEEP:  [5 cmH20] 5 cmH20 Pressure Support:  [5 cmH20] 5 cmH20   Intake/Output Summary (Last 24 hours) at 06/04/2021 0725 Last data filed at 06/04/2021 0600 Gross per 24 hour  Intake 1700.47 ml  Output 570 ml  Net 1130.47 ml    Filed Weights   06/01/21 0500 06/03/21 0500 06/04/21 0500  Weight: 111.4 kg 112.6 kg 112.7 kg     Review of Systems:   Alert and awake Unable to vocalize S/p trach, answers yes/no questions does not want any care ALL OTHER ROS ARE NEGATIVE   Physical Examination:  General: chronically ill appearing female, NAD resting in bed on vent HENT: supple, size 8 XLT tracheostomy present dressing dry/intact  Pulmonary: normal breath sounds, +rhonchi CardiovascularNormal S1,S2.  No m/r/g.  Abdomen: Benign, Soft, non-tender. Skin:   warm, no rashes, no ecchymosis  Neuro: Awake, intermittently follows commands, generalized weakness (but no focal deficits)  affect very flat  PSYCHIATRIC: flat affect  Skin/EXTREMITIES   Labs/imaging that I havepersonally reviewed  (right click and "Reselect all SmartList Selections" daily)  All labs reviewed 06/03/2021   Labs   CBC: Recent Labs  Lab 05/31/21 0306 06/01/21 0423 06/01/21 1013 06/02/21 0503 06/03/21 0534 06/04/21 0438  WBC 10.8* 10.5  --  9.7 9.5 7.7   NEUTROABS 9.9* 9.4*  --  8.3* 8.5* 6.7  HGB 8.0* 6.4* 7.5* 6.9* 7.9* 7.4*  HCT 27.0* 22.2* 25.1* 22.9* 25.5* 24.2*  MCV 96.8 98.2  --  95.0 92.1 91.3  PLT 173 140*  --  146* 158 198     Basic Metabolic Panel: Recent Labs  Lab 05/31/21 0306 05/31/21 1624 06/01/21 0423 06/01/21 1510 06/02/21 0503 06/03/21 0534 06/04/21 0438  NA 137   < > 136 134* 133* 133* 132*  K 5.2*   < > 4.4 4.1 4.2 4.6 6.0*  CL 104   < > 102 100 101 101 98  CO2 26   < > '24 24 24 25 ' 21*  GLUCOSE 301*   < > 252* 162* 193* 161* 189*  BUN 40*   < > 51* 61* 56* 77* 98*  CREATININE 1.45*   < > 1.39* 1.36* 1.31* 1.83* 2.86*  CALCIUM 8.4*   < > 9.5 9.9 9.3 9.6 9.4  MG 2.5*  --  2.2  --  2.1 2.0 2.2  PHOS 2.9   < > 2.4* 2.2* 2.6 3.3 4.6   < > = values in this interval not displayed.    GFR: Estimated Creatinine Clearance: 22.3 mL/min (A) (by C-G formula based on SCr of 2.86 mg/dL (H)). Recent Labs  Lab 06/01/21 0423 06/01/21 1041 06/01/21 1830 06/01/21 2200 06/02/21 0503 06/02/21 0815 06/02/21 1111 06/03/21 0534 06/04/21 0438  WBC 10.5  --   --   --  9.7  --   --  9.5 7.7  LATICACIDVEN  --    < > 3.0* 2.7*  --  3.9* 3.0*  --   --    < > = values in this interval not displayed.     Liver Function Tests: Recent Labs  Lab 05/31/21 0306 05/31/21 1624 06/01/21 0423 06/01/21 1510 06/02/21 0503 06/03/21 0534 06/04/21 0438  AST 75*  --   --   --   --   --   --   ALT 104*  --   --   --   --   --   --   ALKPHOS 206*  --   --   --   --   --   --   BILITOT 1.6*  --   --   --   --   --   --   PROT 5.8*  --   --   --   --   --   --   ALBUMIN 2.5*   2.4*   < > 3.2* 3.2* 3.2* 2.9* 2.8*   < > = values in this interval not displayed.    No results for input(s): LIPASE, AMYLASE in the last 168 hours. No results for input(s): AMMONIA in the last 168 hours.  ABG    Component Value Date/Time   PHART 7.40 05/14/2021 0341   PCO2ART 45 05/14/2021 0341   PO2ART 129 (H) 05/14/2021 0341   HCO3 27.9  05/14/2021  0341   ACIDBASEDEF 12.8 (H) 05/11/2021 0603   O2SAT 98.9 05/14/2021 0341     Coagulation Profile: Recent Labs  Lab 05/28/21 1601  INR 1.2     Cardiac Enzymes: No results for input(s): CKTOTAL, CKMB, CKMBINDEX, TROPONINI in the last 168 hours.  HbA1C: Hgb A1c MFr Bld  Date/Time Value Ref Range Status  05/05/2021 04:53 AM 6.7 (H) 4.8 - 5.6 % Final    Comment:    (NOTE)         Prediabetes: 5.7 - 6.4         Diabetes: >6.4         Glycemic control for adults with diabetes: <7.0   02/11/2021 09:41 AM 6.3 (H) <5.7 % of total Hgb Final    Comment:    For someone without known diabetes, a hemoglobin  A1c value between 5.7% and 6.4% is consistent with prediabetes and should be confirmed with a  follow-up test. . For someone with known diabetes, a value <7% indicates that their diabetes is well controlled. A1c targets should be individualized based on duration of diabetes, age, comorbid conditions, and other considerations. . This assay result is consistent with an increased risk of diabetes. . Currently, no consensus exists regarding use of hemoglobin A1c for diagnosis of diabetes for children. .     CBG: Recent Labs  Lab 06/03/21 1531 06/03/21 1943 06/03/21 2357 06/04/21 0421 06/04/21 0722  GLUCAP 163* 124* 130* 172* 175*     Past Medical History  She,  has a past medical history of AKI (acute kidney injury) ([place]), Arthritis, Asthma, Bacteremia, COPD (chronic obstructive pulmonary disease) ([place]), Diabetes mellitus without complication ([place]), Endometriosis, GERD (gastroesophageal reflux disease), History of echocardiogram, Hypertension, Obesity, PAF (paroxysmal atrial fibrillation) ([place]), and Sleep apnea.   Surgical History    Past Surgical History:  Procedure Laterality Date   ABDOMINAL HYSTERECTOMY     APPLICATION OF WOUND VAC  05/10/2021   Procedure: APPLICATION OF WOUND VAC;  Surgeon: Herbert Pun, MD;  Location: [facility];  Service:  General;;   APPLICATION OF WOUND VAC Left 05/17/2021   Procedure: APPLICATION OF WOUND VAC/WOUND VAC EXCHANGE-Matrix Myriad;  Surgeon: Herbert Pun, MD;  Location: [facility];  Service: General;  Laterality: Left;   APPLICATION OF WOUND VAC  05/24/2021   Procedure: APPLICATION OF WOUND VAC;  Surgeon: Herbert Pun, MD;  Location: [facility];  Service: General;;   COLONOSCOPY  10/29/2006   Dr Allen Norris   COLONOSCOPY WITH PROPOFOL N/A 11/05/2016   Procedure: COLONOSCOPY WITH PROPOFOL;  Surgeon: Robert Bellow, MD;  Location: [facility];  Service: Endoscopy;  Laterality: N/A;   INCISION AND DRAINAGE OF WOUND Left 05/10/2021   Procedure: IRRIGATION AND DEBRIDEMENT LEFT LEG;  Surgeon: Herbert Pun, MD;  Location: [facility];  Service: General;  Laterality: Left;   INCISION AND DRAINAGE OF WOUND Left 05/24/2021   Procedure: IRRIGATION AND DEBRIDEMENT LEFT LEG;  Surgeon: Herbert Pun, MD;  Location: [facility];  Service: General;  Laterality: Left;   IVC FILTER INSERTION N/A 05/29/2021   Procedure: IVC FILTER INSERTION;  Surgeon: Katha Cabal, MD;  Location: [place] [facility];  Service: Cardiovascular;  Laterality: N/A;   MINOR GRAFT APPLICATION  5/63/8937   Procedure: Myriad Matrix  APPLICATION;  Surgeon: Herbert Pun, MD;  Location: [facility];  Service: General;;   NASAL SINUS SURGERY  2002   Dr Carlis Abbott   PEG PLACEMENT N/A 05/28/2021   Procedure: PERCUTANEOUS ENDOSCOPIC GASTROSTOMY (PEG) PLACEMENT;  Surgeon: Benjamine Sprague, DO;  Location: [facility];  Service: General;  Laterality: N/A;  TRAVEL CASE   TRACHEOSTOMY TUBE PLACEMENT N/A 05/17/2021   Procedure: TRACHEOSTOMY;  Surgeon: Clyde Canterbury, MD;  Location: [facility];  Service: ENT;  Laterality: N/A;   WOUND DEBRIDEMENT Left 05/07/2021   Procedure: DEBRIDEMENT WOUND;  Surgeon: Herbert Pun, MD;  Location: [facility];  Service: General;  Laterality: Left;    Allergies No Known Allergies   Home  Medications  Prior to Admission medications   Medication Sig Start Date End Date Taking? Authorizing Provider  albuterol (PROVENTIL) (2.5 MG/3ML) 0.083% nebulizer solution Take 3 mLs (2.5 mg total) by nebulization every 6 (six) hours as needed for wheezing or shortness of breath. 10/03/19   Steele Sizer, MD  aspirin 81 MG tablet Take 1 tablet (81 mg total) by mouth daily. 01/08/16   Steele Sizer, MD  atorvastatin (LIPITOR) 40 MG tablet Take 1 tablet (40 mg total) by mouth daily. 02/11/21   Steele Sizer, MD  azelastine (ASTELIN) 0.1 % nasal spray Place 2 sprays into both nostrils 2 (two) times daily as needed for rhinitis or allergies. 07/31/20   Delsa Grana, PA-C  baclofen (LIORESAL) 10 MG tablet Take 10 mg by mouth daily. 04/16/21   [provider]  calcium carbonate (OSCAL) 1500 (600 Ca) MG TABS tablet Take 600 mg of elemental calcium by mouth 2 (two) times daily with a meal.    [provider]  celecoxib (CELEBREX) 200 MG capsule Take 200 mg by mouth daily. 04/03/21   [provider]  Cetirizine HCl (ZYRTEC ALLERGY) 10 MG TBDP Take 10 mg by mouth at bedtime. For sinus symptoms and seasonal allergies 07/31/20   Delsa Grana, PA-C  docusate sodium (COLACE) 100 MG capsule Take 100 mg by mouth daily.    [provider]  fluticasone Asencion Islam) 50 MCG/ACT nasal spray  08/31/20   [provider]  Fluticasone-Umeclidin-Vilant (TRELEGY ELLIPTA) 100-62.5-25 MCG/INH AEPB Inhale 1 puff into the lungs daily. In place of Stiolto 02/10/20   Steele Sizer, MD  glucose blood (ACCU-CHEK AVIVA PLUS) test strip Use as instructed 12/08/17   Steele Sizer, MD  Lancets (ACCU-CHEK SOFT TOUCH) lancets Use as instructed 12/08/17   Steele Sizer, MD  loratadine (CLARITIN) 10 MG tablet Take 1 tablet (10 mg total) by mouth daily. 02/11/21   Steele Sizer, MD  meloxicam (MOBIC) 15 MG tablet Take 15 mg by mouth daily. 04/23/21   [provider]  metFORMIN  (GLUCOPHAGE-XR) 750 MG 24 hr tablet Take 1 tablet (750 mg total) by mouth daily with breakfast. 02/11/21   Ancil Boozer, Drue Stager, MD  montelukast (SINGULAIR) 10 MG tablet Take 1 tablet (10 mg total) by mouth at bedtime. 02/11/21   Steele Sizer, MD  Multiple Vitamins-Minerals (MULTIVITAL PO) Take 1 tablet by mouth daily.    [provider]  omeprazole (PRILOSEC) 20 MG capsule Take 1 capsule (20 mg total) by mouth daily. 02/11/21   Steele Sizer, MD  [place] 30 MG/2ML SOSY  10/09/20   [provider]  triamterene-hydrochlorothiazide (DYAZIDE) 37.5-25 MG capsule TAKE 1 CAPSULE EVERY DAY 05/02/21   Steele Sizer, MD  vitamin B-12 (CYANOCOBALAMIN) 100 MCG tablet Take 100 mcg by mouth daily.    [provider]  Scheduled Meds:  sodium chloride   Intravenous Once   sodium chloride   Intravenous Once   amiodarone  200 mg Per Tube BID   vitamin C  500 mg Per Tube BID   chlorhexidine gluconate (MEDLINE KIT)  15 mL Mouth Rinse  BID   Chlorhexidine Gluconate Cloth  6 each Topical Q0600   Darbepoetin Alfa  100 mcg Subcutaneous Q7 days   escitalopram  10 mg Per Tube Daily   fiber  1 packet Per Tube BID   free water  30 mL Per Tube Q4H   gentamicin irrigation  80 mg Irrigation QID   hydrocortisone sod succinate (SOLU-CORTEF) inj  100 mg Intravenous BID   insulin aspart  0-20 Units Subcutaneous Q4H   insulin aspart  8 Units Subcutaneous Q4H   insulin detemir  23 Units Subcutaneous BID   ipratropium-albuterol  3 mL Nebulization Q6H   magic mouthwash w/lidocaine  10 mL Oral QID   mouth rinse  15 mL Mouth Rinse 10 times per day   midodrine  15 mg Per Tube TID WC   multivitamin  1 tablet Per Tube QHS   nutrition supplement (JUVEN)  1 packet Per Tube BID BM   oxyCODONE  5 mg Per Tube Q6H   pantoprazole (PROTONIX) IV  40 mg Intravenous Q24H   sodium chloride flush  10-40 mL Intracatheter Q12H   vitamin A  10,000 Units Intramuscular Daily   zinc sulfate  220 mg Per Tube Daily    Continuous Infusions:  sodium chloride Stopped (06/03/21 0902)   albumin human 10 mL/hr at 06/04/21 0400   feeding supplement (PIVOT 1.5 CAL) 1,000 mL (06/04/21 0643)   norepinephrine (LEVOPHED) Adult infusion Stopped (06/02/21 2246)   prismasol BGK 0/2.5     PRN Meds:.sodium chloride, acetaminophen, albuterol, artificial tears, clonazePAM, docusate, HYDROmorphone (DILAUDID) injection, lip balm, ondansetron (ZOFRAN) IV, oxyCODONE, sennosides, sodium chloride flush  Assessment & Plan:   71 yo morbidly AAF Admitted to the ICU with severe sepsis with shock secondary to cellulitis of LLE septic shock with LLL necrotizing fascitis post op resp failure and severe septic shock with progressive renal failure      Severe ACUTE Hypoxic and Hypercapnic Respiratory Failure Ventilator Dependent Respiratory Failure s/p tracheostomy  Hx: COPD, OSA, and Obesity -continue TCT as tolerated -Wean Fio2 and PEEP as tolerated -VAP/VENT bundle implementation - Intermittent chest x-ray & ABG PRN - Ensure adequate pulmonary hygiene  - Trach collar trials  - Per speech therapy trach will need to be downsized to size 6 when pt able to tolerate to be considered for PMV therapy    Septic shock with probable component of hypovolemia-resolving Necrotizing fascitis (strep pyogenes)-present on admission s/p multiple debridements, application of wound VAC system, s/p myriad matrix placement 05/17/2021 Streptococcal toxic shock syndrome Pseudomonas in Tracheal wound culture Lactic acidosis - Continuous cardiac monitoring - Maintain MAP >65 - Vasopressors as needed to maintain MAP goal - IV fluid boluses, aim for positive fluid balance - Will discuss interruption of CRRT and potential transition to HD in a few days with    Nephrology - Continue scheduled midodrine at 15 mg TID  - Continue stress dose steroids due to concerns of adrenal insufficiency; will need to taper once off pressors - Completed full  course of abx therapy~02/17  - General Surgery following, appreciate input ~ wound care as per Surgery   ACUTE KIDNEY INJURY/Renal Failure 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 -continue Foley Catheter-assess need -Avoid nephrotoxic agents -Follow urine output, BMP -Ensure adequate renal perfusion, optimize oxygenation -Renal dose medications   Intake/Output Summary (Last 24 hours) at 06/04/2021 0735 Last data filed at 06/04/2021 0600 Gross per 24 hour  Intake 1700.47 ml  Output 570 ml  Net 1130.47 ml  Intermittent atrial fibrillation with rvr~improving    - Continue amiodarone 200 mg bid  No anticoagulation     Shock liver, resolving  - Monitor hepatic function panel   - Monitor coags   Acute on chronic anemia~pt unable to tolerate chemical anticoagulation s/p IVC filter placement 05/29/2021 Thrombocytopenia, likely in setting of CRRT/sepsis CONSIDER TRANSFUSION  IF HGB<7 DVT PRX with TED/SCD's ONLY   ENDO - ICU hypoglycemic\Hyperglycemia protocol -check FSBS per protocol Type II diabetes mellitus  -CBG's q4h; Target range of 140 to 180 -SSI -Follow ICU Hypo/Hyperglycemia protocol     GI GI PROPHYLAXIS as indicated  NUTRITIONAL STATUS DIET-->TF's as tolerated Constipation protocol as indicated   ELECTROLYTES -follow labs as needed -replace as needed -pharmacy consultation and following   Toxic metabolic encephalopathy~improving  Small SAH  Appears Depressed NO ANTICOAGULATION FOR  1 MONTH AND THEN NEED TO RE-ASSESS NO ANTIPLATELET AGENTS FOR 1 MONTH AND THEN NEED TO RE-ASSESS CAN POSSIBLY CONSIDER BABY ASA BUT WOULD NEED TO PROCEED WITH CAUTION -Continue Lexapro -Psychiatry consulted, appreciate input  Best practice:  Diet: Tube Feeds  Pain/Anxiety/Delirium protocol (if indicated): Scheduled and prn oxycodone  VAP protocol (if indicated): Ordered  DVT prophylaxis: Unable to tolerate chemical prophylaxis s/p IVC filter placement  GI  prophylaxis: PPI Glucose control:  SSI Yes Central venous access: LIJ VasCath with pigtail; right radial a line will discontinue  Arterial line:  N/A Foley:  N/A Mobility:  OOB with PT if able  PT consulted: N/A Last date of multidisciplinary goals of care discussion [06/03/21] Code Status:  full code Disposition: ICU     DVT/GI PRX  assessed I Assessed the need for Labs I Assessed the need for Foley I Assessed the need for Central Venous Line Family Discussion when available I Assessed the need for Mobilization I made an Assessment of medications to be adjusted accordingly Safety Risk assessment completed  CASE DISCUSSED IN MULTIDISCIPLINARY ROUNDS WITH ICU TEAM  Critical Care Time devoted to patient care services described in this note is 55 minutes.  Overall, patient is critically ill, prognosis is guarded.     Corrin Parker, M.D.  [facility]  Medical Director [place] Director [facility] Cardio-Pulmonary Department

## 2021-06-04 NOTE — Progress Notes (Signed)
Chaplain On-Call met the patient's son and daughter in the ICU Waiting Room, while supporting another family there.  They described the patient's lengthy hospitalization, and stated their appreciation for continued Chaplain support. They stated also that their Pastors have been visiting regularly.  Chaplain provided spiritual and emotional support.  Chaplain Pollyann Samples M/Div., [facility]

## 2021-06-04 NOTE — Consult Note (Signed)
PHARMACY CONSULT NOTE  Pharmacy Consult for Electrolyte Monitoring and Replacement   Recent Labs: Potassium (mmol/L)  Date Value  06/04/2021 6.0 (H)  08/11/2013 3.3 (L)   Magnesium (mg/dL)  Date Value  06/04/2021 2.2   Calcium (mg/dL)  Date Value  06/04/2021 9.4   Calcium, Total (mg/dL)  Date Value  08/11/2013 9.2   Albumin (g/dL)  Date Value  06/04/2021 2.8 (L)  08/11/2013 3.1 (L)   Phosphorus (mg/dL)  Date Value  06/04/2021 4.6   Sodium (mmol/L)  Date Value  06/04/2021 132 (L)  08/11/2013 138   Assessment: 71 yo female with a previous medical history of diabetes mellitus type 2, hypertension, obstructive sleep apnea on CPAP, asthma/COPD, GERD, history of COVID-19 pneumonia presenting with lower left extremity pain and chills. Patient found to have severe sepsis and strep pyogenes bacteremia on admission and is currently intubated and sedated. LLL necrotizing fascitis. Small [facility]  Pharmacy has been consulted to monitor and replace electrolytes.   -CRRT stopped 2/6, re-started (2/8>> 2/20) -s/p trach 2/3, tolerating trach collar trials -HD started 2/21  Nutrition:  --Feeds per tube @65ml /hr --Free water per tube 30 mL Q4H  Goal of Therapy:  Electrolytes within normal limits  Plan:  No electrolyte replacement warranted for today Renal function panel repeat in am  Vallery Sa, PharmD, BCPS Clinical Pharmacist 06/04/2021 8:05 AM

## 2021-06-04 NOTE — Progress Notes (Signed)
1100 wound vac changed with surgery team  1200 patient HD cath dressing changed then HD started at bedside

## 2021-06-05 DIAGNOSIS — J9621 Acute and chronic respiratory failure with hypoxia: Secondary | ICD-10-CM | POA: Diagnosis not present

## 2021-06-05 DIAGNOSIS — D62 Acute posthemorrhagic anemia: Secondary | ICD-10-CM | POA: Diagnosis not present

## 2021-06-05 DIAGNOSIS — R140 Abdominal distension (gaseous): Secondary | ICD-10-CM | POA: Diagnosis not present

## 2021-06-05 DIAGNOSIS — J9620 Acute and chronic respiratory failure, unspecified whether with hypoxia or hypercapnia: Secondary | ICD-10-CM | POA: Diagnosis not present

## 2021-06-05 LAB — CBC WITH DIFFERENTIAL/PLATELET
Abs Immature Granulocytes: 0.31 10*3/uL — ABNORMAL HIGH (ref 0.00–0.07)
Basophils Absolute: 0 10*3/uL (ref 0.0–0.1)
Basophils Relative: 0 %
Eosinophils Absolute: 0 10*3/uL (ref 0.0–0.5)
Eosinophils Relative: 0 %
HCT: 23.2 % — ABNORMAL LOW (ref 36.0–46.0)
Hemoglobin: 7.3 g/dL — ABNORMAL LOW (ref 12.0–15.0)
Immature Granulocytes: 4 %
Lymphocytes Relative: 6 %
Lymphs Abs: 0.5 10*3/uL — ABNORMAL LOW (ref 0.7–4.0)
MCH: 28.2 pg (ref 26.0–34.0)
MCHC: 31.5 g/dL (ref 30.0–36.0)
MCV: 89.6 fL (ref 80.0–100.0)
Monocytes Absolute: 0.6 10*3/uL (ref 0.1–1.0)
Monocytes Relative: 7 %
Neutro Abs: 7 10*3/uL (ref 1.7–7.7)
Neutrophils Relative %: 83 %
Platelets: 225 10*3/uL (ref 150–400)
RBC: 2.59 MIL/uL — ABNORMAL LOW (ref 3.87–5.11)
RDW: 18.8 % — ABNORMAL HIGH (ref 11.5–15.5)
Smear Review: NORMAL
WBC: 8.5 10*3/uL (ref 4.0–10.5)
nRBC: 0.6 % — ABNORMAL HIGH (ref 0.0–0.2)

## 2021-06-05 LAB — GLUCOSE, CAPILLARY
Glucose-Capillary: 175 mg/dL — ABNORMAL HIGH (ref 70–99)
Glucose-Capillary: 150 mg/dL — ABNORMAL HIGH (ref 70–99)
Glucose-Capillary: 148 mg/dL — ABNORMAL HIGH (ref 70–99)
Glucose-Capillary: 154 mg/dL — ABNORMAL HIGH (ref 70–99)
Glucose-Capillary: 128 mg/dL — ABNORMAL HIGH (ref 70–99)
Glucose-Capillary: 168 mg/dL — ABNORMAL HIGH (ref 70–99)

## 2021-06-05 LAB — RENAL FUNCTION PANEL
Albumin: 3.1 g/dL — ABNORMAL LOW (ref 3.5–5.0)
Anion gap: 13 (ref 5–15)
BUN: 97 mg/dL — ABNORMAL HIGH (ref 8–23)
CO2: 23 mmol/L (ref 22–32)
Calcium: 8.9 mg/dL (ref 8.9–10.3)
Chloride: 94 mmol/L — ABNORMAL LOW (ref 98–111)
Creatinine, Ser: 2.11 mg/dL — ABNORMAL HIGH (ref 0.44–1.00)
GFR, Estimated: 24 mL/min — ABNORMAL LOW (ref >60)
Glucose, Bld: 194 mg/dL — ABNORMAL HIGH (ref 70–99)
Phosphorus: 4.1 mg/dL (ref 2.5–4.6)
Potassium: 4.4 mmol/L (ref 3.5–5.1)
Sodium: 130 mmol/L — ABNORMAL LOW (ref 135–145)

## 2021-06-05 LAB — PANCREATIC ELASTASE, FECAL: Pancreatic Elastase-1, Stool: 235 ug Elast./g (ref >200)

## 2021-06-05 LAB — ZINC: Zinc: 119 ug/dL — ABNORMAL HIGH (ref 44–115)

## 2021-06-05 LAB — MAGNESIUM: Magnesium: 2 mg/dL (ref 1.7–2.4)

## 2021-06-05 MED ORDER — VALACYCLOVIR HCL 500 MG PO TABS
500.0000 mg | ORAL_TABLET | Freq: Every day | ORAL | Status: DC
  Administered 2021-06-05 – 2021-06-13 (×8): 500 mg
  Filled 2021-06-05 (×9): qty 1

## 2021-06-05 MED ORDER — HYDROCORTISONE SOD SUC (PF) 100 MG IJ SOLR: 10.0000 mg | Freq: Two times a day (BID) | INTRAMUSCULAR | Status: DC

## 2021-06-05 MED ORDER — MIDODRINE HCL 5 MG PO TABS
5.0000 mg | ORAL_TABLET | Freq: Three times a day (TID) | ORAL | Status: DC
  Administered 2021-06-06 (×2): 5 mg
  Filled 2021-06-05 (×2): qty 1

## 2021-06-05 MED ORDER — PANTOPRAZOLE 2 MG/ML SUSPENSION
40.0000 mg | Freq: Every day | ORAL | Status: DC
  Administered 2021-06-06 – 2021-06-13 (×7): 40 mg
  Filled 2021-06-05 (×7): qty 20

## 2021-06-05 MED ORDER — VITAMIN D 25 MCG (1000 UNIT) PO TABS
2000.0000 [IU] | ORAL_TABLET | Freq: Every day | ORAL | Status: DC
  Administered 2021-06-05 – 2021-06-13 (×8): 2000 [IU]
  Filled 2021-06-05 (×8): qty 2

## 2021-06-05 MED ORDER — HYDROCORTISONE SOD SUC (PF) 100 MG IJ SOLR
20.0000 mg | Freq: Two times a day (BID) | INTRAMUSCULAR | Status: AC
  Administered 2021-06-05: 20 mg via INTRAVENOUS
  Filled 2021-06-05: qty 2

## 2021-06-05 NOTE — Op Note (Signed)
[facility] Gastroenterology Patient Name: Alexandra Foster Procedure Date: 05/27/2021 3:54 PM MRN: [id number] Account #: [id number] Date of Birth: 01/13/1951 Admit Type: Inpatient Age: 71 Room: [facility] ENDO ROOM 2 Gender: Female Note Status: Finalized Instrument Name: Upper Endoscope [id number] Procedure:             Upper GI endoscopy Indications:           Dysphagia Providers:             Eliseo Squires MD, MD Medicines:             Monitored Anesthesia Care Complications:         No immediate complications. Procedure:             Pre-Anesthesia Assessment:                        - After reviewing the risks and benefits, the patient                         was deemed in satisfactory condition to undergo the                         procedure.                        After obtaining informed consent, the endoscope was                         passed under direct vision. Throughout the procedure,                         the patient's blood pressure, pulse, and oxygen                         saturations were monitored continuously. The Endoscope                         was introduced through the mouth, and advanced to the                         body of the stomach. The upper GI endoscopy was                         accomplished without difficulty. The patient tolerated                         the procedure well. Findings:      The patient was placed in the supine position for PEG placement. The       stomach was insufflated to appose gastric and abdominal walls. A site       was located in the body of the stomach with excellent transillumination       for placement. The abdominal wall was marked and prepped in a sterile       manner. The area was anesthetized with 3 mL of 0.5% lidocaine. The       trocar needle was introduced through the abdominal wall and into the       stomach under direct endoscopic view. A snare was introduced through the       endoscope and opened in the  gastric  lumen. The guide wire was passed       through the trocar and into the open snare. The snare was closed around       the guide wire. The endoscope and snare were removed, pulling the wire       out through the mouth. A skin incision was made at the site of needle       insertion. The externally removable 16 Fr button gastrostomy tube was       lubricated. The G-tube was tied to the guide wire and pulled through the       mouth and into the stomach. The trocar needle was removed, and the       gastrostomy tube was pulled out from the stomach through the skin. The       external bumper was attached to the gastrostomy tube, and the tube was       cut to remove the guide wire. The final position of the gastrostomy tube       was confirmed by relook endoscopy, and skin marking noted to be [cm] at       the external bumper. The final tension and compression of the abdominal       wall by the PEG tube and external bumper were checked and revealed that       the bumper was loose and lightly touching the skin. The feeding tube was       capped, and the tube site cleaned and dressed. Impression:            - An externally removable PEG placement was                         successfully completed.                        - No specimens collected. Recommendation:        - Return patient to hospital ward for ongoing care. Procedure Code(s):     --- Professional ---                        [phone], 35, Esophagogastroduodenoscopy, flexible,                         transoral; with directed placement of percutaneous                         gastrostomy tube CPT copyright 2019 [employer]. All rights reserved. The codes documented in this report are preliminary and upon coder review may  be revised to meet current compliance requirements. Dr. Sheppard Penton, MD Eliseo Squires MD, MD 05/28/2021 12:13:17 PM This report has been signed electronically. Number of Addenda: 0 Note Initiated On:  05/27/2021 3:54 PM Estimated Blood Loss:  Estimated blood loss was minimal.      [facility]

## 2021-06-05 NOTE — Plan of Care (Signed)
°  Problem: Education: Goal: Knowledge of General Education information will improve Description: Including pain rating scale, medication(s)/side effects and non-pharmacologic comfort measures Outcome: Progressing   Problem: Health Behavior/Discharge Planning: Goal: Ability to manage health-related needs will improve Outcome: Progressing   Problem: Clinical Measurements: Goal: Ability to maintain clinical measurements within normal limits will improve Outcome: Progressing Goal: Will remain free from infection Outcome: Progressing Goal: Diagnostic test results will improve Outcome: Progressing   Problem: Pain Managment: Goal: General experience of comfort will improve Outcome: Progressing   Problem: Activity: Goal: Ability to tolerate increased activity will improve Outcome: Progressing   Problem: Respiratory: Goal: Ability to maintain a clear airway and adequate ventilation will improve Outcome: Progressing   Problem: Role Relationship: Goal: Method of communication will improve Outcome: Progressing   Problem: Education: Goal: Knowledge of disease and its progression will improve Outcome: Progressing   Problem: Clinical Measurements: Goal: Complications related to the disease process or treatment will be avoided or minimized Outcome: Progressing Goal: Dialysis access will remain free of complications Outcome: Progressing   Problem: Fluid Volume: Goal: Fluid volume balance will be maintained or improved Outcome: Progressing   Problem: Urinary Elimination: Goal: Progression of disease will be identified and treated Outcome: Progressing   Problem: Education: Goal: Knowledge of disease or condition will improve Outcome: Progressing Goal: Knowledge of secondary prevention will improve ([facility]) Outcome: Progressing Goal: Knowledge of patient specific risk factors will improve (INDIVIDUALIZE FOR PATIENT) Outcome: Progressing Goal: Individualized Educational  Video(s) Outcome: Progressing   Problem: Coping: Goal: Will verbalize positive feelings about self Outcome: Progressing Goal: Will identify appropriate support needs Outcome: Progressing   Problem: Health Behavior/Discharge Planning: Goal: Ability to manage health-related needs will improve Outcome: Progressing   Problem: Spontaneous Subarachnoid Hemorrhage Tissue Perfusion: Goal: Complications of Spontaneous Subarachnoid Hemorrhage will be minimized Outcome: Progressing

## 2021-06-05 NOTE — TOC Progression Note (Signed)
Transition of Care [facility]) - Progression Note    Patient Details  Name: Alexandra Foster MRN: [id number] Date of Birth: 02/07/1951  Transition of Care [facility]) CM/SW Contact  Shelbie Hutching, RN Phone Number: 06/05/2021, 11:47 AM  Clinical Narrative:    RNCM spoke with patient's daughter, Berenice Primas, and husband Trilby Drummer about LTAC.  They are very interested.  Daughter has looked up [facility] and was very impressed with the reviews.  She will also look up [place] does not currently have a dialysis bed but [facility] does.  Raquel Sarna with [facility] is in contact with her team to see if they offer a bed. Will follow up this afternoon.     Expected Discharge Plan: [place] Barriers to Discharge: Continued Medical Work up  Expected Discharge Plan and Services Expected Discharge Plan: [place]   Discharge Planning Services: CM Consult   Living arrangements for the past 2 months: Single Family Home                                       Social Determinants of Health (SDOH) Interventions    Readmission Risk Interventions No flowsheet data found.

## 2021-06-05 NOTE — Progress Notes (Signed)
Patient ID: Alexandra Foster, female   DOB: [birth year], 71 y.o.   MRN: [id number]     [place] Hospital Day(s): 32.   Interval History: Patient seen and examined, no acute events or new complaints overnight.  As per his nurse, there has been no issue with negative pressure dressing.  Adequate seal.  Vital signs in last 24 hours: [min-max] current  Temp:  [98.4 F (36.9 C)-98.7 F (37.1 C)] 98.4 F (36.9 C) (02/22 0800) Pulse Rate:  [80-93] 84 (02/22 1600) Resp:  [14-21] 18 (02/22 1600) BP: (112-145)/(55-70) 122/63 (02/22 1600) SpO2:  [95 %-100 %] 96 % (02/22 1600) FiO2 (%):  [28 %] 28 % (02/22 1600) Weight:  [111.9 kg] 111.9 kg (02/22 0400)     Height: '5\' 5"'  (165.1 cm) Weight: 111.9 kg BMI (Calculated): 41.05   Physical Exam:  Constitutional: alert, cooperative and no distress  Extremity: Left leg with negative pressure dressing in place.  Moving toes.  Labs:  CBC Latest Ref Rng & Units 06/05/2021 06/04/2021 06/03/2021  WBC 4.0 - 10.5 K/uL 8.5 7.7 9.5  Hemoglobin 12.0 - 15.0 g/dL 7.3(L) 7.4(L) 7.9(L)  Hematocrit 36.0 - 46.0 % 23.2(L) 24.2(L) 25.5(L)  Platelets 150 - 400 K/uL 225 198 158   CMP Latest Ref Rng & Units 06/05/2021 06/04/2021 06/03/2021  Glucose 70 - 99 mg/dL 194(H) 189(H) 161(H)  BUN 8 - 23 mg/dL 97(H) 98(H) 77(H)  Creatinine 0.44 - 1.00 mg/dL 2.11(H) 2.86(H) 1.83(H)  Sodium 135 - 145 mmol/L 130(L) 132(L) 133(L)  Potassium 3.5 - 5.1 mmol/L 4.4 6.0(H) 4.6  Chloride 98 - 111 mmol/L 94(L) 98 101  CO2 22 - 32 mmol/L 23 21(L) 25  Calcium 8.9 - 10.3 mg/dL 8.9 9.4 9.6  Total Protein 6.5 - 8.1 g/dL - - -  Total Bilirubin 0.3 - 1.2 mg/dL - - -  Alkaline Phos 38 - 126 U/L - - -  AST 15 - 41 U/L - - -  ALT 0 - 44 U/L - - -    Imaging studies: No new pertinent imaging studies   Assessment/Plan:  71 y.o. female with left leg necrotizing fasciitis with 28 Day Post-Op s/p debridement, complicated by pertinent comorbidities including strep pyogenes bacteremia  now in septic shock (resolved), respiratory failure mechanical ventilation (resolved), liver failure (resolved), acute renal failure on CRRT, COPD, type 2 diabetes mellitus, hypertension, obstructive sleep apnea, GERD   Left leg necrotizing fasciitis -S/p debridement 05/07/2021 -Second debridement on 05/10/2021.  Application of wound VAC system -S/p myriad matrix placement on 05/17/2021 -The Myriad matrix did not incorporated. Most of the matrix removed today.  -Last dressing change done yesterday.  Next wound VAC change on Friday. -There has been some discussion regarding the discharge planning for this patient.  Patient is a candidate for LTAC.  There was a question of what will be the future wound care for this patient.  The fact that the major stenotic complicated on the ligaments and tendons make this patient a difficult case for skin grafting in the future.  Patient needs something to cover the tendons.  I discussed case with plastic surgery at outside hospital and the alternative for this patient are having multiple surgeries for more debridement and placement of new cellular matrix until it incorporates to the tendons.  This will be the only chance to try to save the leg but still it cannot be guarantee that patient will save the leg and we would not know how much functionality he will  have even if the leg is saved.  The second alternative is to proceed with amputation of the left lower extremity and the rationale will be fast recovery.  I had a discussion with the daughter and the patient husband and patient's daughter at length.  They will think about it.  I recommend to involve vascular surgery to discuss with family regarding her alternatives.  I will continue to give local care until decision is made.  Next negative pressure dressing change on Friday.  Arnold Long, MD

## 2021-06-05 NOTE — Progress Notes (Signed)
PHARMACY NOTE:  ANTIMICROBIAL RENAL DOSAGE ADJUSTMENT  Current antimicrobial regimen includes a mismatch between antimicrobial dosage and estimated renal function.  As per policy approved by the Pharmacy & Therapeutics and Medical Executive Committees, the antimicrobial dosage will be adjusted accordingly.  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 [x]      On intermittent HD    Antimicrobial dosage has been changed to:  Valacyclovir 500 mg daily  Thank you for allowing pharmacy to be a part of this patient's care.  Benita Gutter, [facility] 06/05/2021 11:39 AM

## 2021-06-05 NOTE — TOC Progression Note (Signed)
Transition of Care [facility]) - Progression Note    Patient Details  Name: Alexandra Foster MRN: [id number] Date of Birth: [birth year]  Transition [facility] Care [facility]) CM/SW Contact  Shelbie Hutching, RN Phone Number: 06/05/2021, 10:04 AM  Clinical Narrative:    Patient is doing much better, tolerated HD yesterday.  Downgraded to Stepdown status, patient is stable for LTAC - will discuss with family.   Expected Discharge Plan: [place] Barriers to Discharge: Continued Medical Work up  Expected Discharge Plan and Services Expected Discharge Plan: [place]   Discharge Planning Services: CM Consult   Living arrangements for the past 2 months: Single Family Home                                       Social Determinants of Health (SDOH) Interventions    Readmission Risk Interventions No flowsheet data found.

## 2021-06-05 NOTE — Consult Note (Signed)
PHARMACY CONSULT NOTE  Pharmacy Consult for Electrolyte Monitoring and Replacement   Recent Labs: Potassium (mmol/L)  Date Value  06/05/2021 4.4  08/11/2013 3.3 (L)   Magnesium (mg/dL)  Date Value  06/05/2021 2.0   Calcium (mg/dL)  Date Value  06/05/2021 8.9   Calcium, Total (mg/dL)  Date Value  08/11/2013 9.2   Albumin (g/dL)  Date Value  06/05/2021 3.1 (L)  08/11/2013 3.1 (L)   Phosphorus (mg/dL)  Date Value  06/05/2021 4.1   Sodium (mmol/L)  Date Value  06/05/2021 130 (L)  08/11/2013 138   Assessment: 71 yo female with a previous medical history of diabetes mellitus type 2, hypertension, obstructive sleep apnea on CPAP, asthma/COPD, GERD, history of COVID-19 pneumonia presenting with lower left extremity pain and chills. Patient found to have severe sepsis and strep pyogenes bacteremia on admission and is currently intubated and sedated. LLL necrotizing fascitis. [facility] has been consulted to monitor and replace electrolytes.  Sodium is 130   -CRRT stopped 2/6, re-started (2/8>> 2/20) -s/p trach 2/3, tolerating trach collar trials -HD started 2/21 -Hyperkalemia is resolved 6.0>>4.4   Nutrition:  --Feeds Pivot per tube @65ml /hr --Free water per tube 30 mL Q4H  Goal of Therapy:  Electrolytes within normal limits  Plan:  No electrolyte replacement warranted for today Renal function panel repeat in am

## 2021-06-05 NOTE — TOC Progression Note (Signed)
Transition of Care [facility]) - Progression Note    Patient Details  Name: Gurneet Matarese MRN: [id number] Date of Birth: [birth year]  Transition of Care [facility] [place]) CM/SW Contact  Shelbie Hutching, RN Phone Number: 06/05/2021, 4:07 PM  Clinical Narrative:    RNCM followed back up with patient's daughter.  After family reviewed both facilities they prefer [place] with [facility] will be calling daughter to talk about the facility.      Expected Discharge Plan: [place] Barriers to Discharge: Continued Medical Work up  Expected Discharge Plan and Services Expected Discharge Plan: [place]   Discharge Planning Services: CM Consult   Living arrangements for the past 2 months: Single Family Home                                       Social Determinants of Health (SDOH) Interventions    Readmission Risk Interventions No flowsheet data found.

## 2021-06-05 NOTE — Progress Notes (Signed)
NAME:  Alexandra Foster, MRN:  [id number], DOB:  [birth year], LOS: 32 ADMISSION DATE:  05/04/2021, CONSULTATION DATE:  05/04/2021 REFERRING MD:  Shaune Pollack MD CHIEF COMPLAINT:  Left leg pain   SYNOPSIS  71 y.o female with medical history significant of Asthma/COPD, T2DM, HTN, OSA on CPAP, GERD, COVID-19 pneumonia, and Bilateral lower extremity edema who presented to the ED with chief complaints of LLE pain and chills since Thursday.  Patient report onset of symptoms for the past several days with worsening pain, swelling, fevers and chills, poor po intake and difficulty walking since Thursday. Denies injury to extremity or hx of cellulitis.  ED Course: In the emergency department, the temperature was 37.7C, the heart rate 103 beats/minute, the blood pressure 75/64  mm Hg, the respiratory rate 27 breaths/minute, and the oxygen saturation 99% on RA. CT of left lower leg showed diffuse subcutaneous edema concerning for cellulitis with no fluid collection/abscess. Pertinent Labs in Red/Diagnostics Findings: Na+/ K+: 141/2.9 Glucose: 144 BUN/Cr.: 44/3.47 AST/ALT:67/48   WBC/ TMAX: 16.5/ afebrile PCT: pending Lactic acid: 3.8>6.0 COVID PCR: Negative  ADMITTED FOR SEPTIC SHOCK  Past Medical History    Arthritis   Asthma   COPD (chronic obstructive pulmonary disease) (HCC)   Diabetes mellitus without complication (HCC)   Endometriosis   GERD (gastroesophageal reflux disease)   Hypertension   Obesity   Sleep apnea    CPAP   Significant Hospital Events   1/21: Admitted to the ICU with severe sepsis with shock secondary to cellulitis of LLE 1/23: off pressors +Afib with RVR 1/24: OR for fasciotomy 1/25: post op resp failure with severe septic shock 1/26: remains on pressors 1/27: returns to the OR today for debridement, wound VAC 1/28: overnight with hemodynamic deterioration, required reintubation, currently on pressors 1/29: remains on 3 pressors, CRRT, wound VAC and  minimal sedation.  Awakens to voice and touch.  Seen by vascular surgery and awaiting input from general surgery regarding amputation of left leg 2/1: remains on pressors, on CRRT, MRI/CT shows SAH, afib with RVR, ECHO pending, restarted AMIO at 60 mg/hr 2/3: remains on crrt, remains on vent, plan for [facility] and wound vac change in OR 2/3: S/P TRACH, omplantation of cellular matrix on left leg 2/4: REMAINS ON PRESSORS, REMAINS ON VENT, REMAINS ON CRRT 2/5: REMAINS ON VENT, OFF PRESSORS, REMAINS ON CRRT 2/6: Patient is improved she is able to follow communication verbally and move extermities. She is on 5L/min Trache collar. We have stopped levophed and working on reducing vasopressin. She remains on CRRT.  Insulin infusion is ongoing and plan to have weight based regimen initiated. Additionally she will have SLP for vocalization and PMV trial as well as ROM training with OT/PT.  Multiple specialists on case appreciate everyone involved.  2/7: patient is improved mildly , she's off CRRT, shes off insulin drip. She is mentating.  She was evaluated by [facility].  She was unable to vocalize with PMV.  She may need PEG tube for nourishment.  2/8: patient is overnight worsened with fever and increased levophed dose.  She is negative 1.7L.   She is on zosyn.  2/9: patient is improved this am. She is giving thumbs up during examination. 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 She seems volume depleted as well so we will start CVP monitoring and give IV LR 1L bolus today to check for volume responsiveness. Albumin also while on CRRT.  PT/OT starting today. Type and screen repeat due to plan for OR in am. On zosyn with ID following. Starting [place] heparin today.  2/10: patient is s/p repeat surgery on RLE. She is on levophed post surgery. She is not tachycardic and able to follow some verbal communication.  2/11:  patient stable overnight. Reordering PT/OT today. Wbc count slightly trending up remains on abx probably post surgical.  Remains severely anemic on CRRT and vasopressor support with tracheostomy.  2/12: patient with severe anemia today.  Reviewed with renal team for blood transfusion today.  2/13: remains critically ill, on pressors and CRRT 2/13: trach changed for a #8 cuffed distal XLT without difficulty. Wound cleaner. PER ENT 2/14: remains on pressors, CRRT, VENT, plan for PEG and WOUND vac change at bedside; received 1 unit of PRBC's due to anemia  2/15: s/p IVC filter placement due to inability to tolerate anticoagulation  2/16: plan for Trach collar trials as tolerated.  Continues on CRRT. Pt refusing nursing care and to work with PT/OT, Palliative Care re-consulted 2/17: Pt seems depressed, refusing nursing care.  Consult Psychiatry. 2/18: Dialysis filter continues to clot requiring filter changes with inability to return blood back.  Hgb 6.4 pt received 1 unit pRBC's.  Increased amiodarone frequency from 200 mg daily to bid due to pt converting in and out of atrial fibrillation with rvr 2/19: CRRT discontinued due to frequent clotting of circuit resulting in acute blood loss anemia due to inability to return blood back to pt  2/20: Pt currently off of vasopressors following iv fluid resuscitation  2/21 TCT for 30 hrs,   2/22 TCT for 48 hrs, tolerated HD without pressors  Consults:  PCCM General Surgery  Nephrology ID  Speech  ENT   Significant Diagnostic Tests:  1/21: Chest Xray> no active cardiopulmonary process 1/21: CT left lower leg>Diffuse subcutaneous edema may represent cellulitis. No drainable fluid collection/abscess 1/21: Ultrasound lower unilateral left> no DVT  Micro Data:  1/21: SARS-CoV-2 PCR>>negative 1/21: Influenza PCR>>negative 1/21: Blood culture x2>>group A strep (strep pyogenes) 1/21: Urine Culture>negative  1/21: MRSA PCR>>negative  1/24: Left leg wound  culture>>negative  2/7: Janina Mayo site would culture>>few pseudomonas aeruginosa and few candida albicans  2/8: MRSA PCR>>negative  Antimicrobials:  Vancomycin 1/21>>1/22 Cefepime 1/21>>01/22 Clindamycin 1/22>>1/25 Ceftriaxone 1/21 X1; restarted 1/31>>2/6 Penicillin G 1/22>>1/31 Linezolid 1/25>>1/31 Metronidazole 02/6 x1 dose Unasyn 2/7>>2/7 Zosyn 2/7>>2/17  INTERVAL CHANGES  Off pressors Tolerated HD On TCT Oxygenating well Refuses all care   OBJECTIVE  Blood pressure (!) 116/56, pulse 84, temperature 98.7 F (37.1 C), temperature source Axillary, resp. rate 16, height 5\' 5"  (1.651 m), weight 111.9 kg, SpO2 98 %.    FiO2 (%):  [28 %] 28 %   Intake/Output Summary (Last 24 hours) at 06/05/2021 0802 Last data filed at 06/05/2021 0600 Gross per 24 hour  Intake 2247.74 ml  Output 1402 ml  Net 845.74 ml    Filed Weights   06/04/21 0500 06/04/21 1137 06/05/21 0400  Weight: 112.7 kg 121.8 kg 111.9 kg     Review of Systems:   Alert and awake Unable to vocalize S/p trach, answers yes/no questions does not want any care ALL OTHER ROS ARE NEGATIVE    Physical Examination:   General Appearance: No distress ill appearing EYES PERRLA, EOM intact.   Pulmonary: normal breath sounds, No wheezing.  CardiovascularNormal S1,S2.  No m/r/g.   Abdomen: Benign, Soft, non-tender. PEG in place Skin:   warm, no rashes, no ecchymosis  Extremities:  normal, no cyanosis, clubbing. Neuro:without focal findings,  PSYCHIATRIC: flat affect  Skin/EXTREMITIES   Labs/imaging that I havepersonally reviewed  (right click and "Reselect all SmartList Selections" daily)  All labs reviewed 06/03/2021   Labs   CBC: Recent Labs  Lab 06/01/21 0423 06/01/21 1013 06/02/21 0503 06/03/21 0534 06/04/21 0438 06/05/21 0351  WBC 10.5  --  9.7 9.5 7.7 8.5  NEUTROABS 9.4*  --  8.3* 8.5* 6.7 7.0  HGB 6.4* 7.5* 6.9* 7.9* 7.4* 7.3*  HCT 22.2* 25.1* 22.9* 25.5* 24.2* 23.2*  MCV 98.2  --  95.0 92.1  91.3 89.6  PLT 140*  --  146* 158 198 225     Basic Metabolic Panel: Recent Labs  Lab 06/01/21 0423 06/01/21 1510 06/02/21 0503 06/03/21 0534 06/04/21 0438 06/05/21 0351  NA 136 134* 133* 133* 132* 130*  K 4.4 4.1 4.2 4.6 6.0* 4.4  CL 102 100 101 101 98 94*  CO2 24 24 24 25  21* 23  GLUCOSE 252* 162* 193* 161* 189* 194*  BUN 51* 61* 56* 77* 98* 97*  CREATININE 1.39* 1.36* 1.31* 1.83* 2.86* 2.11*  CALCIUM 9.5 9.9 9.3 9.6 9.4 8.9  MG 2.2  --  2.1 2.0 2.2 2.0  PHOS 2.4* 2.2* 2.6 3.3 4.6 4.1    GFR: Estimated Creatinine Clearance: 30.1 mL/min (A) (by C-G formula based on SCr of 2.11 mg/dL (H)). Recent Labs  Lab 06/01/21 1830 06/01/21 2200 06/02/21 0503 06/02/21 0815 06/02/21 1111 06/03/21 0534 06/04/21 0438 06/05/21 0351  WBC  --   --  9.7  --   --  9.5 7.7 8.5  LATICACIDVEN 3.0* 2.7*  --  3.9* 3.0*  --   --   --      Liver Function Tests: Recent Labs  Lab 05/31/21 0306 05/31/21 1624 06/01/21 1510 06/02/21 0503 06/03/21 0534 06/04/21 0438 06/05/21 0351  AST 75*  --   --   --   --   --   --   ALT 104*  --   --   --   --   --   --   ALKPHOS 206*  --   --   --   --   --   --   BILITOT 1.6*  --   --   --   --   --   --   PROT 5.8*  --   --   --   --   --   --   ALBUMIN 2.5*   2.4*   < > 3.2* 3.2* 2.9* 2.8* 3.1*   < > = values in this interval not displayed.    No results for input(s): LIPASE, AMYLASE in the last 168 hours. No results for input(s): AMMONIA in the last 168 hours.  ABG    Component Value Date/Time   PHART 7.40 05/14/2021 0341   PCO2ART 45 05/14/2021 0341   PO2ART 129 (H) 05/14/2021 0341   HCO3 27.9 05/14/2021 0341   ACIDBASEDEF 12.8 (H) 05/11/2021 0603   O2SAT 98.9 05/14/2021 0341     Coagulation Profile: No results for input(s): INR, PROTIME in the last 168 hours.   Cardiac Enzymes: No results for input(s): CKTOTAL, CKMB, CKMBINDEX, TROPONINI in the last 168 hours.  HbA1C: Hgb A1c MFr Bld  Date/Time Value Ref Range Status   05/05/2021 04:53 AM 6.7 (H) 4.8 - 5.6 % Final    Comment:    (NOTE)         Prediabetes: 5.7 - 6.4  Diabetes: >6.4         Glycemic control for adults with diabetes: <7.0   02/11/2021 09:41 AM 6.3 (H) <5.7 % of total Hgb Final    Comment:    For someone without known diabetes, a hemoglobin  A1c value between 5.7% and 6.4% is consistent with prediabetes and should be confirmed with a  follow-up test. . For someone with known diabetes, a value <7% indicates that their diabetes is well controlled. A1c targets should be individualized based on duration of diabetes, age, comorbid conditions, and other considerations. . This assay result is consistent with an increased risk of diabetes. . Currently, no consensus exists regarding use of hemoglobin A1c for diagnosis of diabetes for children. .     CBG: Recent Labs  Lab 06/04/21 1547 06/04/21 1937 06/04/21 2336 06/05/21 0336 06/05/21 0720  GLUCAP 158* 131* 158* 175* 150*     Past Medical History  She,  has a past medical history of AKI (acute kidney injury) (HCC), Arthritis, Asthma, Bacteremia, COPD (chronic obstructive pulmonary disease) (HCC), Diabetes mellitus without complication (HCC), Endometriosis, GERD (gastroesophageal reflux disease), History of echocardiogram, Hypertension, Obesity, PAF (paroxysmal atrial fibrillation) (HCC), and Sleep apnea.   Surgical History    Past Surgical History:  Procedure Laterality Date   ABDOMINAL HYSTERECTOMY     APPLICATION OF WOUND VAC  05/10/2021   Procedure: APPLICATION OF WOUND VAC;  Surgeon: Carolan Shiver, MD;  Location: [facility];  Service: General;;   APPLICATION OF WOUND VAC Left 05/17/2021   Procedure: APPLICATION OF WOUND VAC/WOUND VAC EXCHANGE-Matrix Myriad;  Surgeon: Carolan Shiver, MD;  Location: [facility];  Service: General;  Laterality: Left;   APPLICATION OF WOUND VAC  05/24/2021   Procedure: APPLICATION OF WOUND VAC;  Surgeon: Carolan Shiver, MD;  Location: [facility];  Service: General;;   COLONOSCOPY  10/29/2006   Dr Servando Snare   COLONOSCOPY WITH PROPOFOL N/A 11/05/2016   Procedure: COLONOSCOPY WITH PROPOFOL;  Surgeon: Earline Mayotte, MD;  Location: [facility];  Service: Endoscopy;  Laterality: N/A;   INCISION AND DRAINAGE OF WOUND Left 05/10/2021   Procedure: IRRIGATION AND DEBRIDEMENT LEFT LEG;  Surgeon: Carolan Shiver, MD;  Location: [facility];  Service: General;  Laterality: Left;   INCISION AND DRAINAGE OF WOUND Left 05/24/2021   Procedure: IRRIGATION AND DEBRIDEMENT LEFT LEG;  Surgeon: Carolan Shiver, MD;  Location: [facility];  Service: General;  Laterality: Left;   IVC FILTER INSERTION N/A 05/29/2021   Procedure: IVC FILTER INSERTION;  Surgeon: Renford Dills, MD;  Location: [facility];  Service: Cardiovascular;  Laterality: N/A;   MINOR GRAFT APPLICATION  05/24/2021   Procedure: Myriad Matrix  APPLICATION;  Surgeon: Carolan Shiver, MD;  Location: [facility];  Service: General;;   NASAL SINUS SURGERY  2002   Dr Chestine Spore   PEG PLACEMENT N/A 05/28/2021   Procedure: PERCUTANEOUS ENDOSCOPIC GASTROSTOMY (PEG) PLACEMENT;  Surgeon: Sung Amabile, DO;  Location: [facility];  Service: General;  Laterality: N/A;  TRAVEL CASE   TRACHEOSTOMY TUBE PLACEMENT N/A 05/17/2021   Procedure: TRACHEOSTOMY;  Surgeon: Geanie Logan, MD;  Location: [facility];  Service: ENT;  Laterality: N/A;   WOUND DEBRIDEMENT Left 05/07/2021   Procedure: DEBRIDEMENT WOUND;  Surgeon: Carolan Shiver, MD;  Location: [facility];  Service: General;  Laterality: Left;    Allergies No Known Allergies   Home Medications  Prior to Admission medications   Medication Sig Start Date End Date Taking? Authorizing Provider  albuterol (PROVENTIL) (2.5 MG/3ML) 0.083% nebulizer  solution Take 3 mLs (2.5 mg total) by nebulization every 6 (six) hours as needed for wheezing or shortness of breath. 10/03/19   [name], MD  aspirin 81 MG  tablet Take 1 tablet (81 mg total) by mouth daily. 01/08/16   [name], MD  atorvastatin (LIPITOR) 40 MG tablet Take 1 tablet (40 mg total) by mouth daily. 02/11/21   [name], MD  azelastine (ASTELIN) 0.1 % nasal spray Place 2 sprays into both nostrils 2 (two) times daily as needed for rhinitis or allergies. 07/31/20   Danelle Berry, PA-C  baclofen (LIORESAL) 10 MG tablet Take 10 mg by mouth daily. 04/16/21   [provider]  calcium carbonate (OSCAL) 1500 (600 Ca) MG TABS tablet Take 600 mg of elemental calcium by mouth 2 (two) times daily with a meal.    [provider]  celecoxib (CELEBREX) 200 MG capsule Take 200 mg by mouth daily. 04/03/21   [provider]  Cetirizine HCl (ZYRTEC ALLERGY) 10 MG TBDP Take 10 mg by mouth at bedtime. For sinus symptoms and seasonal allergies 07/31/20   Danelle Berry, PA-C  docusate sodium (COLACE) 100 MG capsule Take 100 mg by mouth daily.    [provider]  fluticasone Aleda Grana) 50 MCG/ACT nasal spray  08/31/20   [provider]  Fluticasone-Umeclidin-Vilant (TRELEGY ELLIPTA) 100-62.5-25 MCG/INH AEPB Inhale 1 puff into the lungs daily. In place of Stiolto 02/10/20   [name], MD  glucose blood (ACCU-CHEK AVIVA PLUS) test strip Use as instructed 12/08/17   [name], MD  Lancets (ACCU-CHEK SOFT TOUCH) lancets Use as instructed 12/08/17   [name], MD  loratadine (CLARITIN) 10 MG tablet Take 1 tablet (10 mg total) by mouth daily. 02/11/21   [name], MD  meloxicam (MOBIC) 15 MG tablet Take 15 mg by mouth daily. 04/23/21   [provider]  metFORMIN (GLUCOPHAGE-XR) 750 MG 24 hr tablet Take 1 tablet (750 mg total) by mouth daily with breakfast. 02/11/21   Carlynn Purl, [name], MD  montelukast (SINGULAIR) 10 MG tablet Take 1 tablet (10 mg total) by mouth at bedtime. 02/11/21   [name], MD  Multiple Vitamins-Minerals (MULTIVITAL PO) Take 1 tablet by mouth daily.    [provider]  omeprazole (PRILOSEC) 20 MG capsule Take 1 capsule (20 mg total) by mouth daily. 02/11/21   [name], MD  ORTHOVISC 30 MG/2ML SOSY  10/09/20   [provider]  triamterene-hydrochlorothiazide (DYAZIDE) 37.5-25 MG capsule TAKE 1 CAPSULE EVERY DAY 05/02/21   [name], MD  vitamin B-12 (CYANOCOBALAMIN) 100 MCG tablet Take 100 mcg by mouth daily.    [provider]  Scheduled Meds:  amiodarone  200 mg Per Tube BID   vitamin C  500 mg Per Tube BID   chlorhexidine gluconate (MEDLINE KIT)  15 mL Mouth Rinse BID   Chlorhexidine Gluconate Cloth  6 each Topical Q0600   Darbepoetin Alfa  100 mcg Subcutaneous Q7 days   escitalopram  10 mg Per Tube Daily   fiber  1 packet Per Tube TID   free water  30 mL Per Tube Q4H   gentamicin irrigation  80 mg Irrigation QID   hydrocortisone sod succinate (SOLU-CORTEF) inj  100 mg Intravenous BID   insulin aspart  0-20 Units Subcutaneous Q4H   insulin aspart  8 Units Subcutaneous Q4H   insulin detemir  23 Units Subcutaneous BID   ipratropium-albuterol  3 mL Nebulization Q6H   magic mouthwash w/lidocaine  10 mL Oral  QID   mouth rinse  15 mL Mouth Rinse 10 times per day   midodrine  15 mg Per Tube TID WC   multivitamin  1 tablet Per Tube QHS   nutrition supplement (JUVEN)  1 packet Per Tube BID BM   oxyCODONE  5 mg Per Tube Q6H   pantoprazole (PROTONIX) IV  40 mg Intravenous Q24H   sodium chloride flush  10-40 mL Intracatheter Q12H   valACYclovir  1,000 mg Per Tube Daily   vitamin A  10,000 Units Intramuscular Daily   zinc sulfate  220 mg Per Tube Daily   Continuous Infusions:  sodium chloride Stopped (06/03/21 0902)   albumin human Stopped (06/04/21 1109)   feeding supplement (PIVOT 1.5 CAL) 65 mL/hr at 06/05/21 0400   norepinephrine (LEVOPHED) Adult infusion Stopped (06/02/21 2246)   PRN Meds:.sodium chloride, acetaminophen, albuterol, artificial tears, clonazePAM, HYDROmorphone (DILAUDID) injection, lip  balm, ondansetron (ZOFRAN) IV, oxyCODONE, sennosides, sodium chloride flush  Assessment & Plan:   71 yo morbidly AAF Admitted to the ICU with severe sepsis with shock secondary to cellulitis of LLE septic shock with LLL necrotizing fascitis post op resp failure and severe septic shock with progressive renal failure    Severe ACUTE Hypoxic and Hypercapnic Respiratory Failure Respiratory Failure s/p tracheostomy  Hx: COPD, OSA, and Obesity Off vent, on TCT for last 48HRS Wean fio2 as needed - Intermittent chest x-ray & ABG PRN - Ensure adequate pulmonary hygiene  - Trach collar trials  - Per speech therapy trach will need to be downsized to size 6 when pt able to tolerate to be considered for PMV therapy    Septic shock with probable component of hypovolemia-resolving Necrotizing fascitis (strep pyogenes)-present on admission s/p multiple debridements, application of wound VAC system, s/p myriad matrix placement 05/17/2021 Streptococcal toxic shock syndrome Pseudomonas in Tracheal wound culture Lactic acidosis - Continuous cardiac monitoring - Maintain MAP >65 - Continue scheduled midodrine at 15 mg TID - Completed full course of abx therapy~02/17  - General Surgery following, appreciate input ~ wound care as per Surgery  ACUTE KIDNEY INJURY/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 -continue Foley Catheter-assess need -Avoid nephrotoxic agents -Follow urine output, BMP -Ensure adequate renal perfusion, optimize oxygenation -Renal dose medications Tolerate HD 2/21   Intake/Output Summary (Last 24 hours) at 06/05/2021 0805 Last data filed at 06/05/2021 0600 Gross per 24 hour  Intake 2247.74 ml  Output 1402 ml  Net 845.74 ml      Intermittent atrial fibrillation with rvr~improving    - Continue amiodarone 200 mg bid  No anticoagulation     Acute on chronic anemia~pt unable to tolerate chemical anticoagulation s/p IVC filter placement 05/29/2021 Thrombocytopenia, likely in  setting of CRRT/sepsis  ACUTE ANEMIA- TRANSFUSE AS NEEDED CONSIDER TRANSFUSION  IF HGB<7  ENDO - ICU hypoglycemic\Hyperglycemia protocol -check FSBS per protocol Type II diabetes mellitus  -CBG's q4h; Target range of 140 to 180 -SSI -Follow ICU Hypo/Hyperglycemia protocol     GI GI PROPHYLAXIS as indicated  NUTRITIONAL STATUS DIET-->TF's as tolerated Constipation protocol as indicated   ELECTROLYTES -follow labs as needed -replace as needed -pharmacy consultation and following   Toxic metabolic encephalopathy~improving  Small SAH  Appears Depressed NO ANTICOAGULATION FOR  1 MONTH AND THEN NEED TO RE-ASSESS NO ANTIPLATELET AGENTS FOR 1 MONTH AND THEN NEED TO RE-ASSESS CAN POSSIBLY CONSIDER BABY ASA BUT WOULD NEED TO PROCEED WITH CAUTION -Continue Lexapro -Psychiatry consulted, appreciate input  Best practice:  Diet: Tube Feeds  Pain/Anxiety/Delirium protocol (  if indicated): Scheduled and prn oxycodone  VAP protocol (if indicated): Ordered  DVT prophylaxis: Unable to tolerate chemical prophylaxis s/p IVC filter placement  GI prophylaxis: PPI Glucose control:  SSI Yes Central venous access: LIJ VasCath with pigtail; right radial a line will discontinue  Arterial line:  N/A Foley:  N/A Mobility:  OOB with PT if able  PT consulted: N/A Last date of multidisciplinary goals of care discussion [06/03/21] Code Status:  full code Disposition: ICU  SD STATUS  PLAN TO TRANSFER TO TRH in next 24-48 HRS    Aniston Christman Santiago Glad, M.D.  [facility]  Medical Director [facility] Medical Director [facility] Cardio-Pulmonary Department

## 2021-06-05 NOTE — Progress Notes (Signed)
°[facility]  °PROGRESS NOTE  ° °Subjective:  ° °Patient did undergo hemodialysis treatment yesterday. °She appears to have tolerated relatively well. °Serum potassium improved down to 4.4 today. °She will be due for hemodialysis treatment again tomorrow. °Patient's family at bedside. ° ° °02/21 0701 - 02/22 0700 °In: 2327.7 [NG/GT:1780; IV Piggyback:187.7] °Out: 1402 [Drains:200; Stool:200] ° ° ° °Objective:  °Vital signs in last 24 hours:  °Temp:  [98.1 °F (36.7 °C)-98.7 °F (37.1 °C)] 98.4 °F (36.9 °C) (02/22 0800) °Pulse Rate:  [80-93] 85 (02/22 1324) °Resp:  [14-25] 16 (02/22 1324) °BP: (104-145)/(55-70) 145/68 (02/22 1324) °SpO2:  [95 %-100 %] 97 % (02/22 1324) °FiO2 (%):  [28 %] 28 % (02/22 1324) °Weight:  [111.9 kg] 111.9 kg (02/22 0400) ° °Weight change: 9.1 kg °Filed Weights  ° 06/04/21 0500 06/04/21 1137 06/05/21 0400  °Weight: 112.7 kg 121.8 kg 111.9 kg  ° ° °Intake/Output: °I/O last 3 completed shifts: °In: 3907.7 [Other:360; NG/GT:3360; IV Piggyback:187.7] °Out: 1802 [Drains:300; Other:1002; Stool:500] °  °Intake/Output this shift: ° Total I/O °In: 760 [Other:220; NG/GT:540] °Out: 50 [Drains:50] ° °Physical Exam: °General:  Critically ill  °Head:  [place]/AT, OM moist  °Eyes:  Anicteric  °Neck:  tracheostomy in place  °Lungs:   diminished bilaterally, normal effort  °Heart:  regular  °Abdomen:   Soft, nontender  °Extremities:  Left lower extremity wound vac 1+ right lower extremity edema  °Neurologic:  Awake, alert  °Skin:  No lesions  °Access:  Left IJ temp HD catheter  ° ° °Basic Metabolic Panel: °Recent Labs  °Lab 06/01/21 °0423 06/01/21 °1510 06/02/21 °0503 06/03/21 °0534 06/04/21 °0438 06/05/21 °0351  °NA 136 134* 133* 133* 132* 130*  °K 4.4 4.1 4.2 4.6 6.0* 4.4  °CL 102 100 101 101 98 94*  °CO2 24 24 24 25 21* 23  °GLUCOSE 252* 162* 193* 161* 189* 194*  °BUN 51* 61* 56* 77* 98* 97*  °CREATININE 1.39* 1.36* 1.31* 1.83* 2.86* 2.11*  °CALCIUM 9.5 9.9 9.3 9.6 9.4 8.9  °MG 2.2  --  2.1 2.0 2.2  2.0  °PHOS 2.4* 2.2* 2.6 3.3 4.6 4.1  ° ° ° °CBC: °Recent Labs  °Lab 06/01/21 °0423 06/01/21 °1013 06/02/21 °0503 06/03/21 °0534 06/04/21 °0438 06/05/21 °0351  °WBC 10.5  --  9.7 9.5 7.7 8.5  °NEUTROABS 9.4*  --  8.3* 8.5* 6.7 7.0  °HGB 6.4* 7.5* 6.9* 7.9* 7.4* 7.3*  °HCT 22.2* 25.1* 22.9* 25.5* 24.2* 23.2*  °MCV 98.2  --  95.0 92.1 91.3 89.6  °PLT 140*  --  146* 158 198 225  ° ° ° ° °Urinalysis: °No results for input(s): COLORURINE, LABSPEC, PHURINE, GLUCOSEU, HGBUR, BILIRUBINUR, KETONESUR, PROTEINUR, UROBILINOGEN, NITRITE, LEUKOCYTESUR in the last 72 hours. ° °Invalid input(s): APPERANCEUR  ° ° °Imaging: °No results found. ° ° °Medications:  ° ° sodium chloride Stopped (06/03/21 0902)  ° feeding supplement (PIVOT 1.5 CAL) 65 mL/hr at 06/05/21 1220  ° norepinephrine (LEVOPHED) Adult infusion Stopped (06/02/21 2246)  ° ° amiodarone  200 mg Per Tube BID  ° vitamin C  500 mg Per Tube BID  ° chlorhexidine gluconate (MEDLINE KIT)  15 mL Mouth Rinse BID  ° Chlorhexidine Gluconate Cloth  6 each Topical Q0600  ° cholecalciferol  2,000 Units Per Tube Daily  ° Darbepoetin Alfa  100 mcg Subcutaneous Q7 days  ° escitalopram  10 mg Per Tube Daily  ° fiber  1 packet Per Tube TID  ° free water  30 mL Per Tube   Tube Q4H   gentamicin irrigation  80 mg Irrigation QID   [START ON 06/06/2021] hydrocortisone sod succinate (SOLU-CORTEF) inj  10 mg Intravenous Q12H   hydrocortisone sod succinate (SOLU-CORTEF) inj  20 mg Intravenous BID   insulin aspart  0-20 Units Subcutaneous Q4H   insulin aspart  8 Units Subcutaneous Q4H   insulin detemir  23 Units Subcutaneous BID   ipratropium-albuterol  3 mL Nebulization Q6H   magic mouthwash w/lidocaine  10 mL Oral QID   mouth rinse  15 mL Mouth Rinse 10 times per day   midodrine  15 mg Per Tube TID WC   multivitamin  1 tablet Per Tube QHS   nutrition supplement (JUVEN)  1 packet Per Tube BID BM   [START ON 06/06/2021] pantoprazole sodium  40 mg Per Tube Daily   sodium chloride flush  10-40  mL Intracatheter Q12H   valACYclovir  500 mg Per Tube Daily   vitamin A  10,000 Units Intramuscular Daily   zinc sulfate  220 mg Per Tube Daily    Assessment/ Plan:     Principal Problem:   Severe sepsis with septic shock (CODE) ([place]) Active Problems:   COPD (chronic obstructive pulmonary disease) ([place])   Morbid obesity ([place])   Diabetes mellitus type 2 in obese ([place])   Atrial fibrillation and flutter ([place])   Necrotizing fasciitis ([place])   Acute and chronic respiratory failure (acute-on-chronic) ([place])   On mechanically assisted ventilation ([place])   Shock liver   Anemia associated with acute blood loss   Metabolic acidosis   Encounter for continuous renal replacement therapy (CRRT) for acute renal failure (HCC)   Acute encephalopathy   Group A streptococcal infection   Streptococcal toxic shock syndrome ([place])   Status post tracheostomy ([place])   Subdural hemorrhage (HCC)   Left leg cellulitis   Abdominal distension  Alexandra Foster is a 71 y.o. black female with COPD/asthma, diabetes mellitus type II, hypertension, obstructive sleep apnea, paroxysmal atrial fibrillation, GERD, who presents to [facility] on 05/04/2021 for AKI (acute kidney injury) ([place]) [N17.9] Left leg cellulitis [L03.116] Severe sepsis ([place]) [A41.9, R65.20] Severe sepsis with lactic acidosis ([place]) [A41.9, R65.20, E87.20] Found to have necrotizing fascitis requiring multiple debridements by Dr. Peyton Najjar: 1/24. 1/27, 2/3 and 2/10.    #1: Acute kidney injury: AKI is secondary to ischemic nephropathy secondary to hypotension complicated with sepsis. History of bland urine. Baseline creatinine of 0.98, GFR > 60 on 02/11/21.  -Patient has transition from CRRT to intermittent hemodialysis.  She tolerated intermittent hemodialysis well yesterday.  We are planning for hemodialysis treatment again tomorrow.    #2: Sepsis and hypotension: necrotizing fasciitis for left lower extremity status post multiple debridement.   Requiring vasopressors: norepinephrine. On stress dose steroids and midodrine -Patient has been off of pressors for 3 days.  Continue to monitor blood pressure.  #3. Anemia with Acute kidney injury:  Lab Results  Component Value Date   HGB 7.3 (L) 06/05/2021    S/p multiple blood transfusions this admission Aranesp weekly SQ (mon) Hemoglobin currently down slightly to 7.3.  Consider blood transfusion for hemoglobin of 7 or less but defer to primary team.  #4. Acute Respiratory Failure.  Continues to do well off the ventilator at the moment.    LOS: 64 Alexandra Foster, [facility] 2/22/20232:55 PM

## 2021-06-06 DIAGNOSIS — A419 Sepsis, unspecified organism: Secondary | ICD-10-CM | POA: Diagnosis not present

## 2021-06-06 LAB — RENAL FUNCTION PANEL
Albumin: 3 g/dL — ABNORMAL LOW (ref 3.5–5.0)
Anion gap: 13 (ref 5–15)
BUN: 152 mg/dL — ABNORMAL HIGH (ref 8–23)
CO2: 23 mmol/L (ref 22–32)
Calcium: 9.5 mg/dL (ref 8.9–10.3)
Chloride: 95 mmol/L — ABNORMAL LOW (ref 98–111)
Creatinine, Ser: 2.95 mg/dL — ABNORMAL HIGH (ref 0.44–1.00)
GFR, Estimated: 16 mL/min — ABNORMAL LOW (ref >60)
Glucose, Bld: 158 mg/dL — ABNORMAL HIGH (ref 70–99)
Phosphorus: 4.9 mg/dL — ABNORMAL HIGH (ref 2.5–4.6)
Potassium: 5.1 mmol/L (ref 3.5–5.1)
Sodium: 131 mmol/L — ABNORMAL LOW (ref 135–145)

## 2021-06-06 LAB — HEMOGLOBIN AND HEMATOCRIT, BLOOD
HCT: 27.5 % — ABNORMAL LOW (ref 36.0–46.0)
Hemoglobin: 8.7 g/dL — ABNORMAL LOW (ref 12.0–15.0)

## 2021-06-06 LAB — CBC WITH DIFFERENTIAL/PLATELET
Abs Immature Granulocytes: 0.45 10*3/uL — ABNORMAL HIGH (ref 0.00–0.07)
Basophils Absolute: 0 10*3/uL (ref 0.0–0.1)
Basophils Relative: 0 %
Eosinophils Absolute: 0.1 10*3/uL (ref 0.0–0.5)
Eosinophils Relative: 2 %
HCT: 21.7 % — ABNORMAL LOW (ref 36.0–46.0)
Hemoglobin: 6.9 g/dL — ABNORMAL LOW (ref 12.0–15.0)
Immature Granulocytes: 5 %
Lymphocytes Relative: 11 %
Lymphs Abs: 1 10*3/uL (ref 0.7–4.0)
MCH: 28.2 pg (ref 26.0–34.0)
MCHC: 31.8 g/dL (ref 30.0–36.0)
MCV: 88.6 fL (ref 80.0–100.0)
Monocytes Absolute: 0.8 10*3/uL (ref 0.1–1.0)
Monocytes Relative: 9 %
Neutro Abs: 6.3 10*3/uL (ref 1.7–7.7)
Neutrophils Relative %: 73 %
Platelets: 255 10*3/uL (ref 150–400)
RBC: 2.45 MIL/uL — ABNORMAL LOW (ref 3.87–5.11)
RDW: 18.4 % — ABNORMAL HIGH (ref 11.5–15.5)
Smear Review: NORMAL
WBC: 8.7 10*3/uL (ref 4.0–10.5)
nRBC: 1.6 % — ABNORMAL HIGH (ref 0.0–0.2)

## 2021-06-06 LAB — GLUCOSE, CAPILLARY
Glucose-Capillary: 151 mg/dL — ABNORMAL HIGH (ref 70–99)
Glucose-Capillary: 82 mg/dL (ref 70–99)
Glucose-Capillary: 103 mg/dL — ABNORMAL HIGH (ref 70–99)
Glucose-Capillary: 116 mg/dL — ABNORMAL HIGH (ref 70–99)
Glucose-Capillary: 71 mg/dL (ref 70–99)

## 2021-06-06 LAB — PREPARE RBC (CROSSMATCH)

## 2021-06-06 MED ORDER — MIDODRINE HCL 5 MG PO TABS
10.0000 mg | ORAL_TABLET | Freq: Three times a day (TID) | ORAL | Status: DC
  Administered 2021-06-06 – 2021-06-09 (×10): 10 mg
  Filled 2021-06-06 (×11): qty 2

## 2021-06-06 MED ORDER — SODIUM CHLORIDE 0.9% IV SOLUTION: Freq: Once | INTRAVENOUS | Status: DC

## 2021-06-06 MED ORDER — FLORANEX PO PACK
1.0000 g | PACK | Freq: Three times a day (TID) | ORAL | Status: DC
  Filled 2021-06-06 (×2): qty 1

## 2021-06-06 MED ORDER — SODIUM CHLORIDE 0.9 % IV SOLN
250.0000 mg | Freq: Three times a day (TID) | INTRAVENOUS | Status: DC
  Filled 2021-06-06 (×2): qty 5

## 2021-06-06 MED ORDER — HEPARIN SODIUM (PORCINE) 1000 UNIT/ML IJ SOLN
INTRAMUSCULAR | Status: AC
  Administered 2021-06-06: 2800 [IU]
  Filled 2021-06-06: qty 10

## 2021-06-06 MED ORDER — NOREPINEPHRINE 4 MG/250ML-% IV SOLN
2.0000 ug/min | INTRAVENOUS | Status: DC
  Administered 2021-06-06: 4 ug/min via INTRAVENOUS
  Administered 2021-06-07 – 2021-06-08 (×2): 2 ug/min via INTRAVENOUS
  Administered 2021-06-09: 3 ug/min via INTRAVENOUS
  Administered 2021-06-12: 2 ug/min via INTRAVENOUS
  Filled 2021-06-06 (×4): qty 250

## 2021-06-06 MED ORDER — NOREPINEPHRINE 4 MG/250ML-% IV SOLN: 0.0000 ug/min | INTRAVENOUS | Status: DC

## 2021-06-06 MED ORDER — FLORANEX PO PACK
1.0000 g | PACK | Freq: Three times a day (TID) | ORAL | Status: DC
  Administered 2021-06-06 – 2021-06-13 (×16): 1 g
  Filled 2021-06-06 (×23): qty 1

## 2021-06-06 MED ORDER — HYDROCORTISONE SOD SUC (PF) 100 MG IJ SOLR
20.0000 mg | Freq: Every day | INTRAMUSCULAR | Status: AC
  Administered 2021-06-06: 20 mg via INTRAVENOUS
  Filled 2021-06-06: qty 2

## 2021-06-06 MED ORDER — ONDANSETRON HCL 4 MG/2ML IJ SOLN: 4.0000 mg | Freq: Once | INTRAMUSCULAR | Status: DC

## 2021-06-06 MED ORDER — IPRATROPIUM-ALBUTEROL 0.5-2.5 (3) MG/3ML IN SOLN
3.0000 mL | Freq: Three times a day (TID) | RESPIRATORY_TRACT | Status: DC
  Administered 2021-06-06 – 2021-06-13 (×20): 3 mL via RESPIRATORY_TRACT
  Filled 2021-06-06 (×21): qty 3

## 2021-06-06 MED ORDER — VITAMIN A 3 MG (10000 UNIT) PO CAPS
10000.0000 [IU] | ORAL_CAPSULE | Freq: Every day | ORAL | Status: DC
  Administered 2021-06-06 – 2021-06-13 (×7): 10000 [IU]
  Filled 2021-06-06 (×8): qty 1

## 2021-06-06 MED ORDER — SODIUM CHLORIDE 0.9 % IV SOLN
250.0000 mL | INTRAVENOUS | Status: DC
  Administered 2021-06-11: 250 mL via INTRAVENOUS

## 2021-06-06 MED ORDER — DEXTROSE 10 % IV SOLN: INTRAVENOUS | Status: DC

## 2021-06-06 MED ORDER — NOREPINEPHRINE 4 MG/250ML-% IV SOLN
INTRAVENOUS | Status: AC
  Filled 2021-06-06: qty 250

## 2021-06-06 MED ORDER — SODIUM CHLORIDE 0.9 % IV SOLN
250.0000 mg | Freq: Three times a day (TID) | INTRAVENOUS | Status: AC
  Administered 2021-06-06 – 2021-06-09 (×9): 250 mg via INTRAVENOUS
  Filled 2021-06-06 (×9): qty 5

## 2021-06-06 NOTE — Progress Notes (Signed)
Patient we have seen earlier on this admission for other issues. Asked to comment on amputation options in her setting. Left lower extremity has extensive skin loss to just above the calf proper and just below the knee circumferentially. No chance for below-knee amputation without skin to close over the amputation. Best option for her would be the left above-knee amputation just above the knee. Discussed with critical care team.

## 2021-06-06 NOTE — Progress Notes (Signed)
Nutrition Follow Up Note   DOCUMENTATION CODES:   Morbid obesity  INTERVENTION:   Pivot 1.5_0 /hr continuous   Free water flushes 21m q4 hours to maintain tube patency   Regimen provides 2340kcal/day, 146g/day protein and 13640mday of free water   Continue Juven Fruit Punch BID via tube, each serving provides 95kcal and 2.5g of protein (amino acids glutamine and arginine)  Continue Rena-vit daily via tube   Continue Vitamin C 50060mID via tube   Discontinue Zinc 220m77montinue Vitamin A 10,000 units daily via tube   Add Cholecalciferol 2000 units daily via tube   Discontinue NutriSource Fiber   Copper and vitamin A labs pending   NUTRITION DIAGNOSIS:   Increased nutrient needs related to wound healing as evidenced by estimated needs.  GOAL:   Provide needs based on ASPEN/SCCM guidelines -met with tube feeds   MONITOR:   Labs, Weight trends, TF tolerance, Skin, I & O's, Vent status  ASSESSMENT:   70 y3. female with a history of asthma/COPD, HTN, DMII, obesity, OSA on CPAP, GERD and COVID-19 pneumonia who was admitted 1/22 w/ left LE cellulitis, septic shock, S pyogenes bacteremia, hypotension, new Afib and AKI.  Pt s/p I & D 1/24 Pt s/p I & D with veraflow placement 1/27 Pt s/p Left IJ catheter and CRRT 1/28 Pt s/p CT head 1/31; noted to have small SAH  Pt s/p tracheostomy and cellular matrix placement 2/3  Pt s/p surgically placed PEG tube 2/14 Pt s/p IVC filter 2/15 Pt transitioned off CRRT and initiated HD 2/21  Pt remains on trach collar and tolerating well. PEG tube in place. NutriSource Fiber started on 2/20 as pt with diarrhea and large amounts of output from rectal tube. Fiber was increased to three times daily yesterday; pt vomited once overnight and again this morning. Pt agitated while vomiting overnight but was sitting calmly when she vomited this morning. Pt with vomiting earlier in her admission and required erythromycin for several days. RD  suspects pt with gastroparesis; will discontinue fiber supplements as pt appears not to tolerate. Plan is to start probiotics today to help with diarrhea; will include instructions for nursing to wear gloves while administering probiotics. Will also provide another course of erythromycin to help with gastric motility. HD initiated 2/21; pt tolerating well. Vitamin C and zinc labs returned wnl; will discontinue zinc supplementation as pt receiving this from tube feeds and Juven. Will continue vitamin C as pt on HD. Pt with vitamin D deficiency; this is being supplemented. Copper and vitamin A labs pending. Per chart, pt remains weight stable for the past 3 weeks. Pt +4.9L on her I & Os. Pt will need numerous additional surgeries to repair her leg; this was discussed with family who is now considering BKA.   Medications reviewed and include: vitamin C, D3, darbepoetin, heparin, insulin, lactobacillus, rena-vit, juven, zofran, protonix, vitamin A, erythromycin   Labs reviewed: Na 131(L), K 5.1 wnl, BUN 152(H), creat 2.95(H), P 4.9(H) Vitamin C- 2.0- 2/14 Zinc- 119(H), vitamin D 16.13(L)- 2/21 Hgb 6.9(L), Hct 21.7(L) Cbgs- 82, 151 x 24hrs  MAP- >65mm44m Drains- 550ml 17met Order:   Diet Order     None      EDUCATION NEEDS:   Education needs have been addressed  Skin:  Skin Assessment: Reviewed RN Assessment (40 x 35 cm left leg wound, Stage I coccyx, wounds left arm), VAC  Last BM:  2/23- 750ml [id number]  Height:   Ht  Readings from Last 1 Encounters:  05/31/21 _0  (1.651 m)    Weight:   Wt Readings from Last 1 Encounters:  06/06/21 117.7 kg    Ideal Body Weight:  56.8 kg  BMI:  Body mass index is 43.18 kg/m.  Estimated Nutritional Needs:   Kcal:  2200-2500kcal/day  Protein:  120-140 grams  Fluid:  1.7-2.0L/day  Koleen Distance MS, RD, LDN Please refer to [facility] for RD and/or RD on-call/weekend/after hours pager

## 2021-06-06 NOTE — Consult Note (Signed)
PHARMACY CONSULT NOTE  Pharmacy Consult for Electrolyte Monitoring and Replacement   Recent Labs: Potassium (mmol/L)  Date Value  06/06/2021 5.1  08/11/2013 3.3 (L)   Magnesium (mg/dL)  Date Value  06/05/2021 2.0   Calcium (mg/dL)  Date Value  06/06/2021 9.5   Calcium, Total (mg/dL)  Date Value  08/11/2013 9.2   Albumin (g/dL)  Date Value  06/06/2021 3.0 (L)  08/11/2013 3.1 (L)   Phosphorus (mg/dL)  Date Value  06/06/2021 4.9 (H)   Sodium (mmol/L)  Date Value  06/06/2021 131 (L)  08/11/2013 138   Assessment: 71 yo female with a previous medical history of diabetes mellitus type 2, hypertension, obstructive sleep apnea on CPAP, asthma/COPD, GERD, history of COVID-19 pneumonia presenting with lower left extremity pain and chills. Patient found to have severe sepsis and strep pyogenes bacteremia on admission and is currently intubated and sedated. LLL necrotizing fascitis. [facility] has been consulted to monitor and replace electrolytes.   -CRRT stopped 2/6, re-started (2/8>> 2/20) -s/p trach 2/3, tolerating trach collar trials -HD started 2/21  Nutrition:  --Pivot per tube at 65 mL/hr --Free water per tube 30 mL q4h  Goal of Therapy:  Electrolytes within normal limits  Plan:  Due for HD today No electrolyte replacement warranted for today Renal function panel repeat in AM  Benita Gutter  06/06/21

## 2021-06-06 NOTE — Progress Notes (Signed)
°[facility]  °PROGRESS NOTE  ° °Subjective:  ° °Patient still with very little urine output. °Did vomit overnight. °Due for hemodialysis treatment today. ° ° °02/22 0701 - 02/23 0700 °In: 2205 [NG/GT:1740] °Out: 1300 [Drains:550; Stool:750] ° ° ° °Objective:  °Vital signs in last 24 hours:  °Temp:  [97.8 °F (36.6 °C)-98.8 °F (37.1 °C)] 98.8 °F (37.1 °C) (02/23 0200) °Pulse Rate:  [80-95] 89 (02/23 0400) °Resp:  [14-21] 17 (02/23 0400) °BP: (112-145)/(48-68) 118/51 (02/23 0400) °SpO2:  [96 %-99 %] 97 % (02/23 0400) °FiO2 (%):  [28 %-38 %] 28 % (02/23 0000) °Weight:  [117.7 kg] 117.7 kg (02/23 0500) ° °Weight change: -4.1 kg °Filed Weights  ° 06/04/21 1137 06/05/21 0400 06/06/21 0500  °Weight: 121.8 kg 111.9 kg 117.7 kg  ° ° °Intake/Output: °I/O last 3 completed shifts: °In: 2985 [Other:465; NG/GT:2520] °Out: 1600 [Drains:750; Stool:850] °  °Intake/Output this shift: ° No intake/output data recorded. ° °Physical Exam: °General:  Critically ill  °Head:  [place]/AT, OM moist  °Eyes:  Anicteric  °Neck:  tracheostomy in place  °Lungs:   diminished bilaterally, normal effort  °Heart:  regular  °Abdomen:   Soft, nontender  °Extremities:  Left lower extremity wound vac 1+ right lower extremity edema  °Neurologic:  Awake, alert  °Skin:  No lesions  °Access:  Left IJ temp HD catheter  ° ° °Basic Metabolic Panel: °Recent Labs  °Lab 06/01/21 °0423 06/01/21 °1510 06/02/21 °0503 06/03/21 °0534 06/04/21 °0438 06/05/21 °0351 06/06/21 °0521  °NA 136   < > 133* 133* 132* 130* 131*  °K 4.4   < > 4.2 4.6 6.0* 4.4 5.1  °CL 102   < > 101 101 98 94* 95*  °CO2 24   < > 24 25 21* 23 23  °GLUCOSE 252*   < > 193* 161* 189* 194* 158*  °BUN 51*   < > 56* 77* 98* 97* 152*  °CREATININE 1.39*   < > 1.31* 1.83* 2.86* 2.11* 2.95*  °CALCIUM 9.5   < > 9.3 9.6 9.4 8.9 9.5  °MG 2.2  --  2.1 2.0 2.2 2.0  --   °PHOS 2.4*   < > 2.6 3.3 4.6 4.1 4.9*  ° < > = values in this interval not displayed.  ° ° ° °CBC: °Recent Labs  °Lab 06/02/21 °0503  06/03/21 °0534 06/04/21 °0438 06/05/21 °0351 06/06/21 °0521  °WBC 9.7 9.5 7.7 8.5 8.7  °NEUTROABS 8.3* 8.5* 6.7 7.0 6.3  °HGB 6.9* 7.9* 7.4* 7.3* 6.9*  °HCT 22.9* 25.5* 24.2* 23.2* 21.7*  °MCV 95.0 92.1 91.3 89.6 88.6  °PLT 146* 158 198 225 255  ° ° ° ° °Urinalysis: °No results for input(s): COLORURINE, LABSPEC, PHURINE, GLUCOSEU, HGBUR, BILIRUBINUR, KETONESUR, PROTEINUR, UROBILINOGEN, NITRITE, LEUKOCYTESUR in the last 72 hours. ° °Invalid input(s): APPERANCEUR  ° ° °Imaging: °No results found. ° ° °Medications:  ° ° sodium chloride Stopped (06/03/21 0902)  ° feeding supplement (PIVOT 1.5 CAL) Stopped (06/06/21 0600)  ° norepinephrine (LEVOPHED) Adult infusion Stopped (06/02/21 2246)  ° ° amiodarone  200 mg Per Tube BID  ° vitamin C  500 mg Per Tube BID  ° chlorhexidine gluconate (MEDLINE KIT)  15 mL Mouth Rinse BID  ° Chlorhexidine Gluconate Cloth  6 each Topical Q0600  ° cholecalciferol  2,000 Units Per Tube Daily  ° Darbepoetin Alfa  100 mcg Subcutaneous Q7 days  ° escitalopram  10 mg Per Tube Daily  ° fiber  1 packet Per Tube TID  °   free water  30 mL Per Tube Q4H  ° gentamicin irrigation  80 mg Irrigation QID  ° hydrocortisone sod succinate (SOLU-CORTEF) inj  10 mg Intravenous Q12H  ° insulin aspart  0-20 Units Subcutaneous Q4H  ° insulin aspart  8 Units Subcutaneous Q4H  ° insulin detemir  23 Units Subcutaneous BID  ° ipratropium-albuterol  3 mL Nebulization Q6H  ° magic mouthwash w/lidocaine  10 mL Oral QID  ° mouth rinse  15 mL Mouth Rinse 10 times per day  ° midodrine  5 mg Per Tube TID WC  ° multivitamin  1 tablet Per Tube QHS  ° nutrition supplement (JUVEN)  1 packet Per Tube BID BM  ° pantoprazole sodium  40 mg Per Tube Daily  ° sodium chloride flush  10-40 mL Intracatheter Q12H  ° valACYclovir  500 mg Per Tube Daily  ° vitamin A  10,000 Units Intramuscular Daily  ° zinc sulfate  220 mg Per Tube Daily  ° ° °Assessment/ Plan:  °   °Principal Problem: °  Severe sepsis with septic shock (CODE) (HCC) °Active  Problems: °  COPD (chronic obstructive pulmonary disease) (HCC) °  Morbid obesity (HCC) °  Diabetes mellitus type 2 in obese (HCC) °  Atrial fibrillation and flutter (HCC) °  Necrotizing fasciitis (HCC) °  Acute and chronic respiratory failure (acute-on-chronic) (HCC) °  On mechanically assisted ventilation (HCC) °  Shock liver °  Anemia associated with acute blood loss °  Metabolic acidosis °  Encounter for continuous renal replacement therapy (CRRT) for acute renal failure (HCC) °  Acute encephalopathy °  Group A streptococcal infection °  Streptococcal toxic shock syndrome (HCC) °  Status post tracheostomy (HCC) °  Subdural hemorrhage (HCC) °  Left leg cellulitis °  Abdominal distension ° °[name] is a 71 y.o. black female with COPD/asthma, diabetes mellitus type II, hypertension, obstructive sleep apnea, paroxysmal atrial fibrillation, GERD, who presents to [facility] on 05/04/2021 for AKI (acute kidney injury) (HCC) [N17.9] °Left leg cellulitis [L03.116] °Severe sepsis (HCC) [A41.9, R65.20] °Severe sepsis with lactic acidosis (HCC) [A41.9, R65.20, E87.20] °Found to have necrotizing fascitis requiring multiple debridements by Dr. Cintron: 1/24. 1/27, 2/3 and 2/10.  °  °#1: Acute kidney injury: AKI is secondary to ischemic nephropathy secondary to hypotension complicated with sepsis. History of bland urine. Baseline creatinine of 0.98, GFR > 60 on 02/11/21.  °-Patient has successfully transitioned off of CRRT.  We are planning for hemodialysis treatment today. ° °  °#2: Sepsis and hypotension: necrotizing fasciitis for left lower extremity status post multiple debridement.  Requiring vasopressors: norepinephrine. On stress dose steroids and midodrine °-Remains off pressors at the moment.  May need to start if blood pressure drops during dialysis treatment. ° °#3. Anemia with Acute kidney injury:  °Lab Results  °Component Value Date  ° HGB 6.9 (L) 06/06/2021  °  °S/p multiple blood transfusions this  admission °Aranesp weekly SQ (mon) °Continues to have blood loss through the wound VAC.  Hemoglobin down to 6.9.  Consider blood transfusion but defer to primary team.  Would recommend giving blood during dialysis treatment. ° ° °#4. Acute Respiratory Failure.  °Continues to do well off the ventilator at the moment. ° ° ° LOS: 33 ° , MD °[facility] °2/23/20237:33 AM °  °

## 2021-06-06 NOTE — Progress Notes (Addendum)
Hemodialysis notes. Pre treatment BP 138/56 (80) 11:19 HD  treatment started 11:34 BP starts to drop to 94/42 then to 84/40. Pt was alert. UF was turned off, flushed a total of 300cc NSS. UF goal net decreased to 1L from 1.5L. NP Shantelle was notified. ICU nurse was also made aware. Bp drop further to 79/34. Then, they started the pt with Levo IV. Bp went back up to 104/ 49 then up more to 119/56. Continue monitoring, UF maintained off. PT also started Blood transfusion. Systolic BP was maintained above 100. UF was turned on, NP shantelle was notified with order to add the blood volume from the blood transfusion to the UF net goal and may increase goal if BP is maintained.  HD completed. Tolerated well. Total treatment time. 3 hours. Total UF net  removed 971ml Post HD BP125/54 (73)

## 2021-06-06 NOTE — Progress Notes (Signed)
NAME:  Alexandra Foster, MRN:  [id number], DOB:  1951/04/07, LOS: 35 ADMISSION DATE:  05/04/2021, CONSULTATION DATE:  05/04/2021 REFERRING MD:  Duffy Bruce MD CHIEF COMPLAINT:  Left leg pain   HPI  71 y.o female with medical history significant of Asthma/COPD, T2DM, HTN, OSA on CPAP, GERD, COVID-19 pneumonia, and Bilateral lower extremity edema who presented to the ED with chief complaints of LLE pain and chills since Thursday.  Patient report onset of symptoms for the past several days with worsening pain, swelling, fevers and chills, poor po intake and difficulty walking since Thursday. Denies injury to extremity or hx of cellulitis.  ED Course: In the emergency department, the temperature was 37.7C, the heart rate 103 beats/minute, the blood pressure 75/64  mm Hg, the respiratory rate 27 breaths/minute, and the oxygen saturation 99% on RA. CT of left lower leg showed diffuse subcutaneous edema concerning for cellulitis with no fluid collection/abscess. Pertinent Labs in Red/Diagnostics Findings: Na+/ K+: 141/2.9 Glucose: 144 BUN/Cr.: 44/3.47 AST/ALT:67/48   WBC/ TMAX: 16.5/ afebrile PCT: pending Lactic acid: 3.8>6.0 COVID PCR: Negative  Patient given 30 cc/kg of fluids and started on broad-spectrum antibiotics for sepsis with septic shock. Patient remained hypotensive despite IVF boluses therefore was started on Levophed. PCCM consulted.  Past Medical History    Arthritis   Asthma   COPD (chronic obstructive pulmonary disease) ([place])   Diabetes mellitus without complication ([place])   Endometriosis   GERD (gastroesophageal reflux disease)   Hypertension   Obesity   Sleep apnea    CPAP   Significant Hospital Events   1/21: Admitted to the ICU with severe sepsis with shock secondary to cellulitis of LLE 1/23: off pressors +Afib with RVR 1/24: OR for fasciotomy 1/25: post op resp failure with severe septic shock 1/26: remains on pressors 1/27: returns to the OR today for  debridement, wound VAC 1/28: overnight with hemodynamic deterioration, required reintubation, currently on pressors 1/29: remains on 3 pressors, CRRT, wound VAC and minimal sedation.  Awakens to voice and touch.  Seen by vascular surgery and awaiting input from general surgery regarding amputation of left leg 2/1: remains on pressors, on CRRT, MRI/CT shows SAH, afib with RVR, ECHO pending, restarted AMIO at 60 mg/hr 2/3: remains on crrt, remains on vent, plan for [facility] and wound vac change in OR 2/3: S/P TRACH, omplantation of cellular matrix on left leg 2/4: REMAINS ON PRESSORS, REMAINS ON VENT, REMAINS ON CRRT 2/5: REMAINS ON VENT, OFF PRESSORS, REMAINS ON CRRT 2/6: Patient is improved she is able to follow communication verbally and move extermities. She is on 5L/min Trache collar. We have stopped levophed and working on reducing vasopressin. She remains on CRRT.  Insulin infusion is ongoing and plan to have weight based regimen initiated. Additionally she will have SLP for vocalization and PMV trial as well as ROM training with OT/PT.  Multiple specialists on case appreciate everyone involved.  2/7: patient is improved mildly , she's off CRRT, shes off insulin drip. She is mentating.  She was evaluated by [facility].  She was unable to vocalize with PMV.  She may need PEG tube for nourishment.  2/8: patient is overnight worsened with fever and increased levophed dose.  She is negative 1.7L.   She is on zosyn.  2/9: patient is improved this am. She is giving thumbs up during examination. 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 She seems volume depleted  as well so we will start CVP monitoring and give IV LR 1L bolus today to check for volume responsiveness. Albumin also while on CRRT. PT/OT starting today. Type and screen repeat due to plan for OR in am. On zosyn with ID following. Starting [place] heparin today.   2/10: patient is s/p repeat surgery on RLE. She is on levophed 70mg post surgery. She is not tachycardic and able to follow some verbal communication.  2/11: patient stable overnight. Reordering PT/OT today. Wbc count slightly trending up remains on abx probably post surgical.  Remains severely anemic on CRRT and vasopressor support with tracheostomy.  2/12: patient with severe anemia today.  Reviewed with renal team for blood transfusion today.  2/13: remains critically ill, on pressors and CRRT 2/13: trach changed for a #8 cuffed distal XLT without difficulty. Wound cleaner. PER ENT 2/14: remains on pressors, CRRT, VENT, plan for PEG and WOUND vac change at bedside; received 1 unit of PRBC's due to anemia  2/15: s/p IVC filter placement due to inability to tolerate anticoagulation  2/16: plan for Trach collar trials as tolerated.  Continues on CRRT. Pt refusing nursing care and to work with PT/OT, Palliative Care re-consulted 2/17: Pt seems depressed, refusing nursing care.  Consult Psychiatry. 2/18: Dialysis filter continues to clot requiring filter changes with inability to return blood back.  Hgb 6.4 pt received 1 unit pRBC's.  Increased amiodarone frequency from 200 mg daily to bid due to pt converting in and out of atrial fibrillation with rvr  2/19: CRRT discontinued due to frequent clotting of circuit resulting in acute blood loss anemia due to inability to return blood back to pt  2/20: Pt currently off of vasopressors following iv fluid resuscitation  2/21: TCT for 30 hrs 2/22 TCT for 48 hrs, tolerated HD without pressors 2/23: General surgery discussing left BKA with pts family~pt with major stenotic complications on the ligaments and tendons resulting in difficult case for skin grafting in the future   Consults:  PGilesSurgery  Nephrology ID  Speech  ENT   Significant Diagnostic Tests:  1/21: Chest Xray> no active cardiopulmonary process 1/21: CT left lower  leg>Diffuse subcutaneous edema may represent cellulitis. No drainable fluid collection/abscess 1/21: Ultrasound lower unilateral left> no DVT  Micro Data:  1/21: SARS-CoV-2 PCR>>negative 1/21: Influenza PCR>>negative 1/21: Blood culture x2>>group A strep (strep pyogenes) 1/21: Urine Culture>negative  1/21: MRSA PCR>>negative  1/24: Left leg wound culture>>negative  2/7: Trach site would culture>>few pseudomonas aeruginosa and few candida albicans  2/8: MRSA PCR>> negative  Antimicrobials:  Vancomycin 1/21>>1/22 Cefepime 1/21>>01/22 Clindamycin 1/22>>1/25 Ceftriaxone 1/21 X1; restarted 1/31>>2/6 Penicillin G 1/22>>1/31 Linezolid 1/25>>1/31 Metronidazole 02/6 x1 dose Unasyn 2/7>>2/7 Zosyn 2/7>>2/17  Subjective:  Pt intermittently following commands no acute events overnight   OBJECTIVE  Blood pressure (!) 116/54, pulse 85, temperature 99.3 F (37.4 C), temperature source Axillary, resp. rate 17, height '5\' 5"'  (1.651 m), weight 117.7 kg, SpO2 97 %.    FiO2 (%):  [28 %-38 %] 28 %   Intake/Output Summary (Last 24 hours) at 06/06/2021 1023 Last data filed at 06/06/2021 0600 Gross per 24 hour  Intake 1575 ml  Output 1250 ml  Net 325 ml   Filed Weights   06/05/21 0400 06/06/21 0500 06/06/21 1000  Weight: 111.9 kg 117.7 kg 117.7 kg     Physical Examination  General: acute on chronically ill appearing female, NAD resting in bed on vent HENT: supple, no JVD, size 8 XLT tracheostomy present dressing  dry/intact  Lungs: diminished on the right rhonchi all other lobes, even, non labored  Cardiovascular: nsr, rrr, no M/R/G, 2+ radial/2+ distal pulses Abdomen: +BS x4, obese, soft, non tender, non distended  Extremities: LLE negative pressure dressing present clean dry and intact Neuro: Awake, intermittently follows commands, generalized weakness (but no focal deficits) PERRL.  Affect is very flat GU: deferred anuric  Skin: see below    Labs/imaging that I havepersonally reviewed   (right click and "Reselect all SmartList Selections" daily)  All labs reviewed 05/31/2021   Labs   CBC: Recent Labs  Lab 06/02/21 0503 06/03/21 0534 06/04/21 0438 06/05/21 0351 06/06/21 0521  WBC 9.7 9.5 7.7 8.5 8.7  NEUTROABS 8.3* 8.5* 6.7 7.0 6.3  HGB 6.9* 7.9* 7.4* 7.3* 6.9*  HCT 22.9* 25.5* 24.2* 23.2* 21.7*  MCV 95.0 92.1 91.3 89.6 88.6  PLT 146* 158 198 225 383    Basic Metabolic Panel: Recent Labs  Lab 06/01/21 0423 06/01/21 1510 06/02/21 0503 06/03/21 0534 06/04/21 0438 06/05/21 0351 06/06/21 0521  NA 136   < > 133* 133* 132* 130* 131*  K 4.4   < > 4.2 4.6 6.0* 4.4 5.1  CL 102   < > 101 101 98 94* 95*  CO2 24   < > 24 25 21* 23 23  GLUCOSE 252*   < > 193* 161* 189* 194* 158*  BUN 51*   < > 56* 77* 98* 97* 152*  CREATININE 1.39*   < > 1.31* 1.83* 2.86* 2.11* 2.95*  CALCIUM 9.5   < > 9.3 9.6 9.4 8.9 9.5  MG 2.2  --  2.1 2.0 2.2 2.0  --   PHOS 2.4*   < > 2.6 3.3 4.6 4.1 4.9*   < > = values in this interval not displayed.   GFR: Estimated Creatinine Clearance: 22.1 mL/min (A) (by C-G formula based on SCr of 2.95 mg/dL (H)). Recent Labs  Lab 06/01/21 1830 06/01/21 2200 06/02/21 0503 06/02/21 0815 06/02/21 1111 06/03/21 0534 06/04/21 0438 06/05/21 0351 06/06/21 0521  WBC  --   --    < >  --   --  9.5 7.7 8.5 8.7  LATICACIDVEN 3.0* 2.7*  --  3.9* 3.0*  --   --   --   --    < > = values in this interval not displayed.    Liver Function Tests: Recent Labs  Lab 05/31/21 0306 05/31/21 1624 06/02/21 0503 06/03/21 0534 06/04/21 0438 06/05/21 0351 06/06/21 0521  AST 75*  --   --   --   --   --   --   ALT 104*  --   --   --   --   --   --   ALKPHOS 206*  --   --   --   --   --   --   BILITOT 1.6*  --   --   --   --   --   --   PROT 5.8*  --   --   --   --   --   --   ALBUMIN 2.5*   2.4*   < > 3.2* 2.9* 2.8* 3.1* 3.0*   < > = values in this interval not displayed.   No results for input(s): LIPASE, AMYLASE in the last 168 hours. No results for  input(s): AMMONIA in the last 168 hours.  ABG    Component Value Date/Time   PHART 7.40 05/14/2021 0341   PCO2ART  45 05/14/2021 0341   PO2ART 129 (H) 05/14/2021 0341   HCO3 27.9 05/14/2021 0341   ACIDBASEDEF 12.8 (H) 05/11/2021 0603   O2SAT 98.9 05/14/2021 0341     Coagulation Profile: No results for input(s): INR, PROTIME in the last 168 hours.   Cardiac Enzymes: No results for input(s): CKTOTAL, CKMB, CKMBINDEX, TROPONINI in the last 168 hours.  HbA1C: Hgb A1c MFr Bld  Date/Time Value Ref Range Status  05/05/2021 04:53 AM 6.7 (H) 4.8 - 5.6 % Final    Comment:    (NOTE)         Prediabetes: 5.7 - 6.4         Diabetes: >6.4         Glycemic control for adults with diabetes: <7.0   02/11/2021 09:41 AM 6.3 (H) <5.7 % of total Hgb Final    Comment:    For someone without known diabetes, a hemoglobin  A1c value between 5.7% and 6.4% is consistent with prediabetes and should be confirmed with a  follow-up test. . For someone with known diabetes, a value <7% indicates that their diabetes is well controlled. A1c targets should be individualized based on duration of diabetes, age, comorbid conditions, and other considerations. . This assay result is consistent with an increased risk of diabetes. . Currently, no consensus exists regarding use of hemoglobin A1c for diagnosis of diabetes for children. .     CBG: Recent Labs  Lab 06/05/21 1628 06/05/21 1918 06/05/21 2328 06/06/21 0320 06/06/21 0734  GLUCAP 154* 128* 168* 151* 82    Review of Systems:   Unable to assess pt mechanically ventilated via tracheostomy, pt also refuses to answer questions  Past Medical History  She,  has a past medical history of AKI (acute kidney injury) ([place]), Arthritis, Asthma, Bacteremia, COPD (chronic obstructive pulmonary disease) ([place]), Diabetes mellitus without complication ([place]), Endometriosis, GERD (gastroesophageal reflux disease), History of echocardiogram, Hypertension,  Obesity, PAF (paroxysmal atrial fibrillation) ([place]), and Sleep apnea.   Surgical History    Past Surgical History:  Procedure Laterality Date   ABDOMINAL HYSTERECTOMY     APPLICATION OF WOUND VAC  05/10/2021   Procedure: APPLICATION OF WOUND VAC;  Surgeon: Herbert Pun, MD;  Location: [facility] ORS;  Service: General;;   APPLICATION OF WOUND VAC Left 05/17/2021   Procedure: APPLICATION OF WOUND VAC/WOUND VAC EXCHANGE-Matrix Myriad;  Surgeon: Herbert Pun, MD;  Location: [facility] ORS;  Service: General;  Laterality: Left;   APPLICATION OF WOUND VAC  05/24/2021   Procedure: APPLICATION OF WOUND VAC;  Surgeon: Herbert Pun, MD;  Location: [facility] ORS;  Service: General;;   COLONOSCOPY  10/29/2006   Dr Allen Norris   COLONOSCOPY WITH PROPOFOL N/A 11/05/2016   Procedure: COLONOSCOPY WITH PROPOFOL;  Surgeon: Robert Bellow, MD;  Location: [facility] ENDOSCOPY;  Service: Endoscopy;  Laterality: N/A;   INCISION AND DRAINAGE OF WOUND Left 05/10/2021   Procedure: IRRIGATION AND DEBRIDEMENT LEFT LEG;  Surgeon: Herbert Pun, MD;  Location: [facility] ORS;  Service: General;  Laterality: Left;   INCISION AND DRAINAGE OF WOUND Left 05/24/2021   Procedure: IRRIGATION AND DEBRIDEMENT LEFT LEG;  Surgeon: Herbert Pun, MD;  Location: [facility] ORS;  Service: General;  Laterality: Left;   IVC FILTER INSERTION N/A 05/29/2021   Procedure: IVC FILTER INSERTION;  Surgeon: Katha Cabal, MD;  Location: [facility];  Service: Cardiovascular;  Laterality: N/A;   MINOR GRAFT APPLICATION  1/95/0932   Procedure: Myriad Matrix  APPLICATION;  Surgeon: Herbert Pun, MD;  Location: [facility]  ORS;  Service: General;;   NASAL SINUS SURGERY  2002   Dr Carlis Abbott   PEG PLACEMENT N/A 05/28/2021   Procedure: PERCUTANEOUS ENDOSCOPIC GASTROSTOMY (PEG) PLACEMENT;  Surgeon: Benjamine Sprague, DO;  Location: [facility] ENDOSCOPY;  Service: General;  Laterality: N/A;  TRAVEL CASE   TRACHEOSTOMY TUBE PLACEMENT N/A 05/17/2021    Procedure: TRACHEOSTOMY;  Surgeon: Clyde Canterbury, MD;  Location: [facility] ORS;  Service: ENT;  Laterality: N/A;   WOUND DEBRIDEMENT Left 05/07/2021   Procedure: DEBRIDEMENT WOUND;  Surgeon: Herbert Pun, MD;  Location: [facility] ORS;  Service: General;  Laterality: Left;     Social History   reports that she quit smoking about 20 years ago. Her smoking use included cigarettes. She has a 80.00 pack-year smoking history. She quit smokeless tobacco use about 20 years ago.  Her smokeless tobacco use included snuff. She reports that she does not drink alcohol and does not use drugs.   Family History   Her family history includes Alcohol abuse in her brother; Breast cancer (age of onset: 25) in her sister; Congestive Heart Failure in her mother; Coronary artery disease (age of onset: 70) in her father; Heart disease in her brother; Varicose Veins in her brother.   Allergies No Known Allergies   Home Medications  Prior to Admission medications   Medication Sig Start Date End Date Taking? Authorizing Provider  albuterol (PROVENTIL) (2.5 MG/3ML) 0.083% nebulizer solution Take 3 mLs (2.5 mg total) by nebulization every 6 (six) hours as needed for wheezing or shortness of breath. 10/03/19   Steele Sizer, MD  aspirin 81 MG tablet Take 1 tablet (81 mg total) by mouth daily. 01/08/16   Steele Sizer, MD  atorvastatin (LIPITOR) 40 MG tablet Take 1 tablet (40 mg total) by mouth daily. 02/11/21   Steele Sizer, MD  azelastine (ASTELIN) 0.1 % nasal spray Place 2 sprays into both nostrils 2 (two) times daily as needed for rhinitis or allergies. 07/31/20   Delsa Grana, PA-C  baclofen (LIORESAL) 10 MG tablet Take 10 mg by mouth daily. 04/16/21   [provider]  calcium carbonate (OSCAL) 1500 (600 Ca) MG TABS tablet Take 600 mg of elemental calcium by mouth 2 (two) times daily with a meal.    [provider]  celecoxib (CELEBREX) 200 MG capsule Take 200 mg by mouth daily. 04/03/21   [provider]  Cetirizine HCl (ZYRTEC ALLERGY) 10 MG TBDP Take 10 mg by mouth at bedtime. For sinus symptoms and seasonal allergies 07/31/20   Delsa Grana, PA-C  docusate sodium (COLACE) 100 MG capsule Take 100 mg by mouth daily.    [provider]  fluticasone Asencion Islam) 50 MCG/ACT nasal spray  08/31/20   [provider]  Fluticasone-Umeclidin-Vilant (TRELEGY ELLIPTA) 100-62.5-25 MCG/INH AEPB Inhale 1 puff into the lungs daily. In place of Stiolto 02/10/20   Steele Sizer, MD  glucose blood (ACCU-CHEK AVIVA PLUS) test strip Use as instructed 12/08/17   Steele Sizer, MD  Lancets (ACCU-CHEK SOFT TOUCH) lancets Use as instructed 12/08/17   Steele Sizer, MD  loratadine (CLARITIN) 10 MG tablet Take 1 tablet (10 mg total) by mouth daily. 02/11/21   Steele Sizer, MD  meloxicam (MOBIC) 15 MG tablet Take 15 mg by mouth daily. 04/23/21   [provider]  metFORMIN (GLUCOPHAGE-XR) 750 MG 24 hr tablet Take 1 tablet (750 mg total) by mouth daily with breakfast. 02/11/21   Ancil Boozer, Drue Stager, MD  montelukast (SINGULAIR) 10 MG tablet Take 1 tablet (10 mg total) by mouth  at bedtime. 02/11/21   Steele Sizer, MD  Multiple Vitamins-Minerals (MULTIVITAL PO) Take 1 tablet by mouth daily.    [provider]  omeprazole (PRILOSEC) 20 MG capsule Take 1 capsule (20 mg total) by mouth daily. 02/11/21   Steele Sizer, MD  Makoti 30 MG/2ML SOSY  10/09/20   [provider]  triamterene-hydrochlorothiazide (DYAZIDE) 37.5-25 MG capsule TAKE 1 CAPSULE EVERY DAY 05/02/21   Steele Sizer, MD  vitamin B-12 (CYANOCOBALAMIN) 100 MCG tablet Take 100 mcg by mouth daily.    [provider]  Scheduled Meds:  sodium chloride   Intravenous Once   amiodarone  200 mg Per Tube BID   vitamin C  500 mg Per Tube BID   chlorhexidine gluconate (MEDLINE KIT)  15 mL Mouth Rinse BID   Chlorhexidine Gluconate Cloth  6 each Topical Q0600   cholecalciferol  2,000 Units Per Tube Daily    Darbepoetin Alfa  100 mcg Subcutaneous Q7 days   escitalopram  10 mg Per Tube Daily   fiber  1 packet Per Tube TID   free water  30 mL Per Tube Q4H   heparin sodium (porcine)       insulin aspart  0-20 Units Subcutaneous Q4H   insulin aspart  8 Units Subcutaneous Q4H   insulin detemir  23 Units Subcutaneous BID   ipratropium-albuterol  3 mL Nebulization Q6H   lactobacillus  1 g Per Tube TID WC   magic mouthwash w/lidocaine  10 mL Oral QID   mouth rinse  15 mL Mouth Rinse 10 times per day   midodrine  5 mg Per Tube TID WC   multivitamin  1 tablet Per Tube QHS   nutrition supplement (JUVEN)  1 packet Per Tube BID BM   pantoprazole sodium  40 mg Per Tube Daily   sodium chloride flush  10-40 mL Intracatheter Q12H   valACYclovir  500 mg Per Tube Daily   vitamin A  10,000 Units Per Tube Daily   zinc sulfate  220 mg Per Tube Daily   Continuous Infusions:  sodium chloride Stopped (06/03/21 0902)   feeding supplement (PIVOT 1.5 CAL) 1,000 mL (06/06/21 0750)   PRN Meds:.sodium chloride, acetaminophen, albuterol, artificial tears, clonazePAM, HYDROmorphone (DILAUDID) injection, lip balm, ondansetron (ZOFRAN) IV, oxyCODONE, sennosides, sodium chloride flush  Assessment & Plan:   Ventilator Dependent Respiratory Failure s/p tracheostomy  ATELECTASIS, CHF PULMONARY EDEMA AND PLEURAL EFFUSIONS ALL PRESENT ON ADMISSION Hx: COPD, OSA, and Obesity - Trach collar trials as tolerated with mechanical ventilation qhs for now  - Wean Fio2 and PEEP as tolerated - VAP/VENT bundle implementation - Wean PEEP & FiO2 as tolerated, maintain SpO2 >88% - VAP protocol in place - Plateau pressures less than 30 cm H20  - Intermittent chest x-ray & ABG PRN - Ensure adequate pulmonary hygiene   - Per speech therapy trach will need to be downsized to size 6 when pt able to tolerate to be considered for PMV therapy    Intermittent atrial fibrillation with rvr  - Continuous cardiac monitoring - Continue  scheduled midodrine   - Continue amiodarone   Left necrotizing fascitis (strep pyogenes)-present on admission s/p multiple debridements, application of wound VAC system, s/p myriad matrix placement 05/17/2021 Streptococcal toxic shock syndrome Pseudomonas in Tracheal wound culture (? respiratory secretions coating the tracheostomy) - Monitor fever curve - Trend WBC's  - Completed full course of abx therapy~02/17  - General Surgery following, appreciate input ~ wound care as per surgery; now discussing possible  left BKA with pts family   Acute kidney injury secondary to ischemic nephropathy in the setting of hypotension complicated with sepsis  - Avoid nephrotoxic agents - Follow urine output, BMP - Ensure adequate renal perfusion, optimize oxygenation - Renal dose medications - Nephrology consulted appreciate input~pt now tolerating HD    Shock liver   - Monitor hepatic function panel   - Monitor coags   Acute on chronic anemia~pt unable to tolerate chemical anticoagulation s/p IVC filter placement 05/29/2021 -Monitor for S/Sx of bleeding -Trend CBC -Unable to tolerate anticoagulants or SCD's for VTE Prophylaxis ~ IVC filter placed 2/15 -Transfuse for Hgb <7  Type II diabetes mellitus  -CBG's q4h; Target range of 140 to 180 -SSI -Follow ICU Hypo/Hyperglycemia protocol    Toxic metabolic encephalopathy~improving  Small SAH  Appears Depressed NO ANTICOAGULATION FOR  1 MONTH AND THEN NEED TO RE-ASSESS NO ANTIPLATELET AGENTS FOR 1 MONTH AND THEN NEED TO RE-ASSESS CAN POSSIBLY CONSIDER BABY ASA BUT WOULD NEED TO PROCEED WITH CAUTION -Continue Lexapro  Best practice:  Diet: Tube Feeds  Pain/Anxiety/Delirium protocol (if indicated): scheduled and prn oxycodone  VAP protocol (if indicated): Ordered  DVT prophylaxis: Unable to tolerate chemical prophylaxis s/p IVC filter placement  GI prophylaxis: PPI Glucose control:  SSI Yes Central venous access: Right IJ CVL; Left IJ Dual  Lumen Dialysis Catheter Arterial line:  N/A Foley:  N/A Mobility:  bed rest  PT consulted: N/A Last date of multidisciplinary goals of care discussion [06/06/21] Code Status:  full code Disposition: ICU   = Goals of Care = Code Status Order: ICU  Primary Emergency ContactJOLINDA, [place], Home Phone: [phone] Will update family once they arrive at bedside   Critical care time: 49 minutes    Rosilyn Mings, [place] Pager [phone] (please enter 7 digits) PCCM Consult Pager [phone] (please enter 7 digits)

## 2021-06-07 ENCOUNTER — Inpatient Hospital Stay: Payer: Medicare HMO

## 2021-06-07 DIAGNOSIS — A419 Sepsis, unspecified organism: Secondary | ICD-10-CM | POA: Diagnosis not present

## 2021-06-07 LAB — RENAL FUNCTION PANEL
Albumin: 2.7 g/dL — ABNORMAL LOW (ref 3.5–5.0)
Anion gap: 14 (ref 5–15)
BUN: 80 mg/dL — ABNORMAL HIGH (ref 8–23)
CO2: 26 mmol/L (ref 22–32)
Calcium: 9.2 mg/dL (ref 8.9–10.3)
Chloride: 91 mmol/L — ABNORMAL LOW (ref 98–111)
Creatinine, Ser: 2.12 mg/dL — ABNORMAL HIGH (ref 0.44–1.00)
GFR, Estimated: 24 mL/min — ABNORMAL LOW (ref >60)
Glucose, Bld: 83 mg/dL (ref 70–99)
Phosphorus: 5.3 mg/dL — ABNORMAL HIGH (ref 2.5–4.6)
Potassium: 4.5 mmol/L (ref 3.5–5.1)
Sodium: 131 mmol/L — ABNORMAL LOW (ref 135–145)

## 2021-06-07 LAB — HEPATIC FUNCTION PANEL
ALT: 118 U/L — ABNORMAL HIGH (ref 0–44)
AST: 81 U/L — ABNORMAL HIGH (ref 15–41)
Albumin: 2.8 g/dL — ABNORMAL LOW (ref 3.5–5.0)
Alkaline Phosphatase: 193 U/L — ABNORMAL HIGH (ref 38–126)
Bilirubin, Direct: 0.6 mg/dL — ABNORMAL HIGH (ref 0.0–0.2)
Indirect Bilirubin: 1 mg/dL — ABNORMAL HIGH (ref 0.3–0.9)
Total Bilirubin: 1.6 mg/dL — ABNORMAL HIGH (ref 0.3–1.2)
Total Protein: 5.7 g/dL — ABNORMAL LOW (ref 6.5–8.1)

## 2021-06-07 LAB — GLUCOSE, CAPILLARY
Glucose-Capillary: 106 mg/dL — ABNORMAL HIGH (ref 70–99)
Glucose-Capillary: 108 mg/dL — ABNORMAL HIGH (ref 70–99)
Glucose-Capillary: 110 mg/dL — ABNORMAL HIGH (ref 70–99)
Glucose-Capillary: 65 mg/dL — ABNORMAL LOW (ref 70–99)
Glucose-Capillary: 72 mg/dL (ref 70–99)
Glucose-Capillary: 75 mg/dL (ref 70–99)
Glucose-Capillary: 95 mg/dL (ref 70–99)

## 2021-06-07 LAB — CBC WITH DIFFERENTIAL/PLATELET
Abs Immature Granulocytes: 1.01 10*3/uL — ABNORMAL HIGH (ref 0.00–0.07)
Basophils Absolute: 0.1 10*3/uL (ref 0.0–0.1)
Basophils Relative: 1 %
Eosinophils Absolute: 0.3 10*3/uL (ref 0.0–0.5)
Eosinophils Relative: 2 %
HCT: 26.2 % — ABNORMAL LOW (ref 36.0–46.0)
Hemoglobin: 8.4 g/dL — ABNORMAL LOW (ref 12.0–15.0)
Immature Granulocytes: 8 %
Lymphocytes Relative: 9 %
Lymphs Abs: 1.2 10*3/uL (ref 0.7–4.0)
MCH: 28 pg (ref 26.0–34.0)
MCHC: 32.1 g/dL (ref 30.0–36.0)
MCV: 87.3 fL (ref 80.0–100.0)
Monocytes Absolute: 1 10*3/uL (ref 0.1–1.0)
Monocytes Relative: 8 %
Neutro Abs: 9.1 10*3/uL — ABNORMAL HIGH (ref 1.7–7.7)
Neutrophils Relative %: 72 %
Platelets: 266 10*3/uL (ref 150–400)
RBC: 3 MIL/uL — ABNORMAL LOW (ref 3.87–5.11)
RDW: 18.6 % — ABNORMAL HIGH (ref 11.5–15.5)
WBC: 12.6 10*3/uL — ABNORMAL HIGH (ref 4.0–10.5)
nRBC: 2.8 % — ABNORMAL HIGH (ref 0.0–0.2)

## 2021-06-07 LAB — TYPE AND SCREEN
ABO/RH(D): O POS
ABO/RH(D): O POS
Antibody Screen: NEGATIVE
Antibody Screen: NEGATIVE
Unit division: 0

## 2021-06-07 LAB — BPAM RBC
Blood Product Expiration Date: 202303262359
ISSUE DATE / TIME: 202302231208
Unit Type and Rh: 5100

## 2021-06-07 LAB — MAGNESIUM: Magnesium: 2 mg/dL (ref 1.7–2.4)

## 2021-06-07 MED ORDER — METOCLOPRAMIDE HCL 5 MG/ML IJ SOLN
5.0000 mg | Freq: Three times a day (TID) | INTRAMUSCULAR | Status: DC
  Administered 2021-06-07 – 2021-06-13 (×18): 5 mg via INTRAVENOUS
  Filled 2021-06-07 (×18): qty 2

## 2021-06-07 MED ORDER — COLLAGENASE 250 UNIT/GM EX OINT
TOPICAL_OINTMENT | Freq: Every day | CUTANEOUS | Status: DC
  Filled 2021-06-07: qty 30

## 2021-06-07 MED ORDER — DEXTROSE 50 % IV SOLN
12.5000 g | Freq: Once | INTRAVENOUS | Status: AC
  Administered 2021-06-07: 12.5 g via INTRAVENOUS
  Filled 2021-06-07: qty 50

## 2021-06-07 MED ORDER — LOPERAMIDE HCL 2 MG PO CAPS: 2.0000 mg | ORAL_CAPSULE | ORAL | Status: DC | PRN

## 2021-06-07 MED ORDER — BLISTEX MEDICATED EX OINT
TOPICAL_OINTMENT | Freq: Three times a day (TID) | CUTANEOUS | Status: DC
  Filled 2021-06-07: qty 6.3

## 2021-06-07 NOTE — Plan of Care (Signed)
°  Problem: Education: Goal: Knowledge of General Education information will improve Description: Including pain rating scale, medication(s)/side effects and non-pharmacologic comfort measures Outcome: Not Progressing   Problem: Health Behavior/Discharge Planning: Goal: Ability to manage health-related needs will improve Outcome: Not Progressing   Problem: Clinical Measurements: Goal: Ability to maintain clinical measurements within normal limits will improve Outcome: Not Progressing Goal: Will remain free from infection Outcome: Not Progressing Goal: Diagnostic test results will improve Outcome: Not Progressing   Problem: Pain Managment: Goal: General experience of comfort will improve Outcome: Not Progressing   Problem: Safety: Goal: Ability to remain free from injury will improve Outcome: Not Progressing   Problem: Skin Integrity: Goal: Risk for impaired skin integrity will decrease Outcome: Not Progressing   Problem: Activity: Goal: Ability to tolerate increased activity will improve Outcome: Not Progressing   Problem: Respiratory: Goal: Ability to maintain a clear airway and adequate ventilation will improve Outcome: Not Progressing   Problem: Role Relationship: Goal: Method of communication will improve Outcome: Not Progressing   Problem: Education: Goal: Knowledge of disease and its progression will improve Outcome: Not Progressing   Problem: Clinical Measurements: Goal: Complications related to the disease process or treatment will be avoided or minimized Outcome: Not Progressing Goal: Dialysis access will remain free of complications Outcome: Not Progressing   Problem: Fluid Volume: Goal: Fluid volume balance will be maintained or improved Outcome: Not Progressing   Problem: Urinary Elimination: Goal: Progression of disease will be identified and treated Outcome: Not Progressing   Problem: Education: Goal: Knowledge of disease or condition will  improve Outcome: Not Progressing Goal: Knowledge of secondary prevention will improve (SELECT ALL) Outcome: Not Progressing Goal: Knowledge of patient specific risk factors will improve (INDIVIDUALIZE FOR PATIENT) Outcome: Not Progressing Goal: Individualized Educational Video(s) Outcome: Not Progressing   Problem: Coping: Goal: Will verbalize positive feelings about self Outcome: Not Progressing Goal: Will identify appropriate support needs Outcome: Not Progressing   Problem: Health Behavior/Discharge Planning: Goal: Ability to manage health-related needs will improve Outcome: Not Progressing   Problem: Self-Care: Goal: Ability to participate in self-care as condition permits will improve Outcome: Not Progressing Goal: Verbalization of feelings and concerns over difficulty with self-care will improve Outcome: Not Progressing Goal: Ability to communicate needs accurately will improve Outcome: Not Progressing   Problem: Nutrition: Goal: Risk of aspiration will decrease Outcome: Not Progressing Goal: Dietary intake will improve Outcome: Not Progressing   Problem: Spontaneous Subarachnoid Hemorrhage Tissue Perfusion: Goal: Complications of Spontaneous Subarachnoid Hemorrhage will be minimized Outcome: Not Progressing  Patient total care, unable to provide care for self

## 2021-06-07 NOTE — Consult Note (Signed)
PHARMACY CONSULT NOTE  Pharmacy Consult for Electrolyte Monitoring and Replacement   Recent Labs: Potassium (mmol/L)  Date Value  06/07/2021 4.5  08/11/2013 3.3 (L)   Magnesium (mg/dL)  Date Value  06/07/2021 2.0   Calcium (mg/dL)  Date Value  06/07/2021 9.2   Calcium, Total (mg/dL)  Date Value  08/11/2013 9.2   Albumin (g/dL)  Date Value  06/07/2021 2.7 (L)  06/07/2021 2.8 (L)  08/11/2013 3.1 (L)   Phosphorus (mg/dL)  Date Value  06/07/2021 5.3 (H)   Sodium (mmol/L)  Date Value  06/07/2021 131 (L)  08/11/2013 138   Assessment: 71 yo female with a previous medical history of diabetes mellitus type 2, hypertension, obstructive sleep apnea on CPAP, asthma/COPD, GERD, history of COVID-19 pneumonia presenting with lower left extremity pain and chills. Patient found to have severe sepsis and strep pyogenes bacteremia on admission and is currently intubated and sedated. LLL necrotizing fascitis. [facility] has been consulted to monitor and replace electrolytes.   -CRRT stopped 2/6, re-started (2/8>> 2/20) -s/p trach 2/3, tolerating trach collar trials -HD 2/21, 2/23  Nutrition:  --Pivot per tube at 65 mL/hr --Free water per tube 30 mL q4h  Goal of Therapy:  Electrolytes within normal limits  Plan:  Mild stable hyponatremia, worsening hypochloremia. Fluid balance / adjustment as indicated per PCCM / nephrology Mild hyperphosphatemia, defer management to nephrology No electrolyte replacement warranted for today Renal function panel repeat in AM  Benita Gutter  06/07/21

## 2021-06-07 NOTE — Progress Notes (Signed)
[facility]  PROGRESS NOTE   Subjective:   Patient did not tolerate dialysis as well as she did on Tuesday. Pressures had to be restarted during dialysis treatment yesterday. She is currently off of pressors however.   02/23 0701 - 02/24 0700 In: 1287.8 [I.V.:350.6; Blood:350; NG/GT:347.2; IV Piggyback:200] Out: 1926 [Urine:375; Drains:400; Stool:200]    Objective:  Vital signs in last 24 hours:  Temp:  [96.2 F (35.7 C)-99.3 F (37.4 C)] 98.2 F (36.8 C) (02/24 0742) Pulse Rate:  [75-101] 87 (02/24 0830) Resp:  [13-23] 18 (02/24 0830) BP: (79-138)/(34-66) 104/54 (02/24 0830) SpO2:  [96 %-100 %] 97 % (02/24 0830) FiO2 (%):  [28 %] 28 % (02/24 0830) Weight:  [116.6 kg-119.1 kg] 116.6 kg (02/24 0313)  Weight change: 0 kg Filed Weights   06/06/21 1000 06/06/21 1449 06/07/21 0313  Weight: 117.7 kg 119.1 kg 116.6 kg    Intake/Output: I/O last 3 completed shifts: In: 2132.8 [I.V.:350.6; Blood:350; Other:40; NG/GT:1192.2; IV ORVIFBPPH:432] Out: 7614 [Urine:375; Drains:750; Other:951; Stool:400]   Intake/Output this shift:  Total I/O In: 220.1 [I.V.:80.1; Other:40; IV Piggyback:100] Out: -   Physical Exam: General:  Critically ill  Head:  [place]/AT, OM moist  Eyes:  Anicteric  Neck:  tracheostomy in place  Lungs:  Scattered rhonchi, normal effort  Heart:  regular  Abdomen:   Soft, nontender  Extremities:  Left lower extremity wound vac 1+ right lower extremity edema  Neurologic:  Awake, alert  Skin:  No lesions  Access:  Left IJ temp HD catheter    Basic Metabolic Panel: Recent Labs  Lab 06/02/21 0503 06/03/21 0534 06/04/21 0438 06/05/21 0351 06/06/21 0521 06/07/21 0421  NA 133* 133* 132* 130* 131* 131*  K 4.2 4.6 6.0* 4.4 5.1 4.5  CL 101 101 98 94* 95* 91*  CO2 24 25 21* '23 23 26  ' GLUCOSE 193* 161* 189* 194* 158* 83  BUN 56* 77* 98* 97* 152* 80*  CREATININE 1.31* 1.83* 2.86* 2.11* 2.95* 2.12*  CALCIUM 9.3 9.6 9.4 8.9 9.5 9.2  MG 2.1 2.0  2.2 2.0  --  2.0  PHOS 2.6 3.3 4.6 4.1 4.9* 5.3*     CBC: Recent Labs  Lab 06/03/21 0534 06/04/21 0438 06/05/21 0351 06/06/21 0521 06/06/21 1615 06/07/21 0421  WBC 9.5 7.7 8.5 8.7  --  12.6*  NEUTROABS 8.5* 6.7 7.0 6.3  --  9.1*  HGB 7.9* 7.4* 7.3* 6.9* 8.7* 8.4*  HCT 25.5* 24.2* 23.2* 21.7* 27.5* 26.2*  MCV 92.1 91.3 89.6 88.6  --  87.3  PLT 158 198 225 255  --  266      Urinalysis: No results for input(s): COLORURINE, LABSPEC, PHURINE, GLUCOSEU, HGBUR, BILIRUBINUR, KETONESUR, PROTEINUR, UROBILINOGEN, NITRITE, LEUKOCYTESUR in the last 72 hours.  Invalid input(s): APPERANCEUR    Imaging: No results found.   Medications:    sodium chloride Stopped (06/03/21 0902)   sodium chloride     dextrose 20 mL/hr at 06/07/21 0800   erythromycin Stopped (06/07/21 0647)   feeding supplement (PIVOT 1.5 CAL) 1,000 mL (06/07/21 0734)   norepinephrine (LEVOPHED) Adult infusion Stopped (06/07/21 0400)    amiodarone  200 mg Per Tube BID   vitamin C  500 mg Per Tube BID   chlorhexidine gluconate (MEDLINE KIT)  15 mL Mouth Rinse BID   Chlorhexidine Gluconate Cloth  6 each Topical Q0600   cholecalciferol  2,000 Units Per Tube Daily   Darbepoetin Alfa  100 mcg Subcutaneous Q7 days   escitalopram  10  mg Per Tube Daily   free water  30 mL Per Tube Q4H   insulin aspart  0-20 Units Subcutaneous Q4H   insulin aspart  8 Units Subcutaneous Q4H   insulin detemir  23 Units Subcutaneous BID   ipratropium-albuterol  3 mL Nebulization TID   lactobacillus  1 g Per Tube TID WC   magic mouthwash w/lidocaine  10 mL Oral QID   mouth rinse  15 mL Mouth Rinse 10 times per day   midodrine  10 mg Per Tube TID WC   multivitamin  1 tablet Per Tube QHS   nutrition supplement (JUVEN)  1 packet Per Tube BID BM   pantoprazole sodium  40 mg Per Tube Daily   sodium chloride flush  10-40 mL Intracatheter Q12H   valACYclovir  500 mg Per Tube Daily   vitamin A  10,000 Units Per Tube Daily    Assessment/  Plan:     Principal Problem:   Severe sepsis with septic shock (CODE) ([place]) Active Problems:   COPD (chronic obstructive pulmonary disease) ([place])   Morbid obesity ([place])   Diabetes mellitus type 2 in obese ([place])   Atrial fibrillation and flutter ([place])   Necrotizing fasciitis ([place])   Acute and chronic respiratory failure (acute-on-chronic) ([place])   On mechanically assisted ventilation ([place])   Shock liver   Anemia associated with acute blood loss   Metabolic acidosis   Encounter for continuous renal replacement therapy (CRRT) for acute renal failure (HCC)   Acute encephalopathy   Group A streptococcal infection   Streptococcal toxic shock syndrome ([place])   Status post tracheostomy ([place])   Subdural hemorrhage (HCC)   Left leg cellulitis   Abdominal distension  Alexandra Foster is a 71 y.o. black female with COPD/asthma, diabetes mellitus type II, hypertension, obstructive sleep apnea, paroxysmal atrial fibrillation, GERD, who presents to [facility] on 05/04/2021 for AKI (acute kidney injury) ([place]) [N17.9] Left leg cellulitis [L03.116] Severe sepsis ([place]) [A41.9, R65.20] Severe sepsis with lactic acidosis ([place]) [A41.9, R65.20, E87.20] Found to have necrotizing fascitis requiring multiple debridements by Dr. Peyton Najjar: 1/24. 1/27, 2/3 and 2/10.    #1: Acute kidney injury: AKI is secondary to ischemic nephropathy secondary to hypotension complicated with sepsis. History of bland urine. Baseline creatinine of 0.98, GFR > 60 on 02/11/21.  Patient was on CRRT for prolonged period of time.  Transitioned to hemodialysis 06/04/2021. -Patient did not tolerate hemodialysis as well as she did on Tuesday.  However she was able to be weaned off of pressors after the dialysis treatment.  We will plan for hemodialysis treatment again tomorrow.    #2: Sepsis and hypotension: necrotizing fasciitis for left lower extremity status post multiple debridement.  Requiring vasopressors: norepinephrine. On stress  dose steroids and midodrine -Did require short-term pressors during dialysis treatment yesterday but now weaned off.  Continue to monitor blood pressure trend.  #3. Anemia with Acute kidney injury:  Lab Results  Component Value Date   HGB 8.4 (L) 06/07/2021    S/p multiple blood transfusions this admission Aranesp weekly SQ (mon) Continues to have blood loss through the wound VAC.  Hemoglobin up to 8.4 post blood transfusion.  Continue to monitor CBC closely if she continues to lose blood through the wound VAC.   #4. Acute Respiratory Failure.  Continues to do well off the ventilator at the moment.     LOS: 13 Alexandra Foster, [facility] 2/24/20239:41 AM

## 2021-06-07 NOTE — Progress Notes (Signed)
Patient ID: Alexandra Foster, female   DOB: 09-28-1950, 71 y.o.   MRN: [id number]     [place] [facility] Day(s): 34.   Interval History: Patient seen and examined, no acute events or new complaints overnight.  No issues as per nurse report.  Daughter present at bedside.  Patient today more alert and cooperative.  Vital signs in last 24 hours: [min-max] current  Temp:  [97.7 F (36.5 C)-98.2 F (36.8 C)] 98 F (36.7 C) (02/24 1200) Pulse Rate:  [75-95] 83 (02/24 1600) Resp:  [15-23] 18 (02/24 1600) BP: (81-135)/(31-66) 105/50 (02/24 1600) SpO2:  [96 %-100 %] 98 % (02/24 1600) FiO2 (%):  [28 %] 28 % (02/24 1358) Weight:  [116.6 kg] 116.6 kg (02/24 0313)     Height: '5\' 5"'  (165.1 cm) Weight: 116.6 kg BMI (Calculated): 42.78   Physical Exam:  Constitutional: alert, cooperative and no distress  Extremity: Patient continue creating adequate granulation tissue and muscular tissue.  Tendons exposed.       Labs:  CBC Latest Ref Rng & Units 06/07/2021 06/06/2021 06/06/2021  WBC 4.0 - 10.5 K/uL 12.6(H) - 8.7  Hemoglobin 12.0 - 15.0 g/dL 8.4(L) 8.7(L) 6.9(L)  Hematocrit 36.0 - 46.0 % 26.2(L) 27.5(L) 21.7(L)  Platelets 150 - 400 K/uL 266 - 255   CMP Latest Ref Rng & Units 06/07/2021 06/06/2021 06/05/2021  Glucose 70 - 99 mg/dL 83 158(H) 194(H)  BUN 8 - 23 mg/dL 80(H) 152(H) 97(H)  Creatinine 0.44 - 1.00 mg/dL 2.12(H) 2.95(H) 2.11(H)  Sodium 135 - 145 mmol/L 131(L) 131(L) 130(L)  Potassium 3.5 - 5.1 mmol/L 4.5 5.1 4.4  Chloride 98 - 111 mmol/L 91(L) 95(L) 94(L)  CO2 22 - 32 mmol/L '26 23 23  ' Calcium 8.9 - 10.3 mg/dL 9.2 9.5 8.9  Total Protein 6.5 - 8.1 g/dL 5.7(L) - -  Total Bilirubin 0.3 - 1.2 mg/dL 1.6(H) - -  Alkaline Phos 38 - 126 U/L 193(H) - -  AST 15 - 41 U/L 81(H) - -  ALT 0 - 44 U/L 118(H) - -    Imaging studies: No new pertinent imaging studies   Assessment/Plan:  71 y.o. female with left leg necrotizing fasciitis with 30 Day Post-Op s/p debridement,  complicated by pertinent comorbidities including strep pyogenes bacteremia now in septic shock (resolved), respiratory failure mechanical ventilation (resolved), liver failure (resolved), acute renal failure on CRRT, COPD, type 2 diabetes mellitus, hypertension, obstructive sleep apnea, GERD.  Left leg necrotizing fasciitis -S/p debridement 05/07/2021 -Second debridement on 05/10/2021.  Application of wound VAC system -S/p myriad matrix placement on 05/17/2021 -The Myriad matrix did not incorporated. Most of the matrix removed today.  -Today I remove the negative pressure dressing and apply a new wound VAC (DME). -I again discussed with daughter about plan of wound care.  At this moment there is still thinking about continue with negative pressure dressing and local cares versus the amputation.  They are thinking of making the final decision once he gets to the LTAC. -I will continue with negative pressure changes on Tuesdays and Fridays until any changes on decisions are made.  Arnold Long, MD

## 2021-06-07 NOTE — Evaluation (Signed)
Occupational Therapy Re-Evaluation Patient Details Name: Alexandra Foster MRN: [id number] DOB: 1951-01-23 Today's Date: 06/07/2021   History of Present Illness 71 y/o female with history of Asthma/COPD, T2DM, HTN, OSA on CPAP, GERD, COVID-19 pneumonia, and Bilateral lower extremity edema who presented to the ED with chief complaint of LLE pain.  Cellulitis of LLE, septic shock with LLL necrotizing fascitis post op resp failure and severe septic shock with progressive renal failure on CRRT and progressive multiorgan failure.  Pt needing vent support on trach at night, HD, tube feeds. Pt is now s/p I&D of LLE & wound vac application on 7/89/38. Per secure chat with nephrologist it is okay to see pt while on CRRT. Surgeon also clears pt for participation with WBAT LLE.   Clinical Impression   Alexandra Foster was seen for OT treatment on this date. Upon arrival to room pt reclined in bed with family at bedside. Pt requires MAX A facewashing at bed level - family in room assisted with pt shaking head with attempted hand over hand assist. Pt tolerated BUE AAROM and therex as described below. Family demonstrates good recall of PROM/AAROM technique and reports completing daily. Additional HEP provided. Goals remain appropriate, frequency decreased to reflect pt decreased participation. Discharge recommendation remains appropriate.        Recommendations for follow up therapy are one component of a multi-disciplinary discharge planning process, led by the attending physician.  Recommendations may be updated based on patient status, additional functional criteria and insurance authorization.   Follow Up Recommendations  OT at Long-term acute care hospital    Assistance Recommended at Discharge Frequent or constant Supervision/Assistance  Patient can return home with the following Two people to help with bathing/dressing/bathroom;Two people to help with walking and/or transfers;Assistance with  cooking/housework;Assist for transportation;Help with stairs or ramp for entrance;Assistance with feeding;Direct supervision/assist for financial management;Direct supervision/assist for medications management    Functional Status Assessment     Equipment Recommendations  Other (comment) (defer to next venue of care)    Recommendations for Other Services       Precautions / Restrictions Precautions Precautions: Fall Precaution Comments: L jugular temp dialysis catheter, wounds under most skin folds, LLE wound vac, ventilator via tracheostomy, rectal tube, catheter Restrictions Weight Bearing Restrictions: Yes LLE Weight Bearing: Weight bearing as tolerated Other Position/Activity Restrictions: per surgeon, no restrictions      Mobility Bed Mobility               General bed mobility comments: pt refuses, RN reports recently completed bed mobility with staf              ADL either performed or assessed with clinical judgement   ADL Overall ADL's : Needs assistance/impaired                                       General ADL Comments: MAX A facewashing at bed level - family in room assisted.      Pertinent Vitals/Pain Pain Assessment Pain Assessment: Faces Faces Pain Scale: Hurts little more Pain Location: mouth Pain Descriptors / Indicators: Grimacing, Guarding Pain Intervention(s): Limited activity within patient's tolerance, Repositioned     Hand Dominance     Extremity/Trunk Assessment Upper Extremity Assessment Upper Extremity Assessment: Generalized weakness           Communication     Cognition Arousal/Alertness: Awake/alert, Lethargic Behavior During Therapy: Flat affect Overall  Cognitive Status: Difficult to assess                                 General Comments: husband and daughter at bed side. Pt responds to yes/no questions with head movement. Follows ~25% of commands this session     General  Comments       Exercises Exercises: General Upper Extremity, General Lower Extremity General Exercises - Upper Extremity Shoulder Flexion: PROM, AAROM, Strengthening, Both, 10 reps, Supine Shoulder ABduction: PROM, AAROM, Strengthening, Both, 10 reps, Supine Shoulder ADduction: PROM, AAROM, Strengthening, Both, 10 reps, Supine Shoulder Horizontal ABduction: PROM, AAROM, Strengthening, Both, 10 reps, Supine Shoulder Horizontal ADduction: PROM, AAROM, Strengthening, Both, 10 reps, Supine Elbow Flexion: PROM, AAROM, Strengthening, Both, 10 reps, Supine Elbow Extension: PROM, AAROM, Strengthening, Both, 10 reps, Supine Wrist Flexion: PROM, AAROM, Strengthening, Both, 10 reps, Supine Wrist Extension: PROM, AAROM, Strengthening, Both, 10 reps, Supine     OT Goals(Current goals can be found in the care plan section) Acute Rehab OT Goals Patient Stated Goal: to be left alone OT Goal Formulation: With patient/family Time For Goal Achievement: 06/21/21 Potential to Achieve Goals: Fair ADL Goals Pt Will Perform Grooming: bed level;with mod assist Pt/caregiver will Perform Home Exercise Program: Increased strength;Both right and left upper extremity;With written HEP provided Additional ADL Goal #1: Pt will participate in bed mobility requiring MAX A +1 person for bed level ADL and repositioning.  OT Frequency: Min 1X/week       AM-PAC OT "6 Clicks" Daily Activity     Outcome Measure Help from another person eating meals?: A Lot Help from another person taking care of personal grooming?: A Lot Help from another person toileting, which includes using toliet, bedpan, or urinal?: Total Help from another person bathing (including washing, rinsing, drying)?: Total Help from another person to put on and taking off regular upper body clothing?: Total Help from another person to put on and taking off regular lower body clothing?: Total 6 Click Score: 8   End of Session Nurse Communication: Mobility  status  Activity Tolerance: Patient limited by fatigue Patient left: in bed;with call bell/phone within reach;with family/visitor present  OT Visit Diagnosis: Other abnormalities of gait and mobility (R26.89);Muscle weakness (generalized) (M62.81);Pain Pain - Right/Left: Left Pain - part of body: Leg                Time: 2010-0712 OT Time Calculation (min): 11 min Charges:  OT General Charges $OT Visit: 1 Visit OT Evaluation $OT Re-eval: 1 Re-eval  Dessie Coma, M.S. OTR/L  06/07/21, 2:47 PM  ascom [phone]

## 2021-06-07 NOTE — TOC Progression Note (Signed)
Transition of Care [facility]) - Progression Note    Patient Details  Name: Alexandra Foster MRN: [id number] Date of Birth: October 24, 1950  [facility] CM/SW Contact  Shelbie Hutching, RN Phone Number: 06/07/2021, 10:37 AM  Clinical Narrative:    Family thinking about amputation of the left leg.  Vascular surgery consulted and they would have to do an above the knee amputation. A bed has become available at [facility] but if the family decides on amputation LTAC will not be an option at this time.    TOC will cont to follow.   Expected Discharge Plan: [place] Barriers to Discharge: Continued Medical Work up  Expected Discharge Plan and Services Expected Discharge Plan: [place]   Discharge Planning Services: CM Consult   Living arrangements for the past 2 months: Single Family Home                                       Social Determinants of Health (SDOH) Interventions    Readmission Risk Interventions No flowsheet data found.

## 2021-06-07 NOTE — Consult Note (Addendum)
[place] Nurse Consult Note: Surgical team is following for left leg Vac changes and plan of care. Vascular team has recommended amputation.   Please refer to these teams for further questions.  Reason for Consult: Requested to assess "several wounds."  Pt is critically ill with multiple systemic factors which can impair healing. She is sedated, on vent and on a low airloss mattress to reduce pressure. Upper and lower lip with brown scattered scabbed areas; right lower lip .5X.5cm and upper lip 1X1.5 cm, dry and adhered.  Pt is currently on Valtrex and already has Blistix lip cream ordered for topical treatment. Middle back with partial thickness skin tear, pink and dry and healing.  1.5X1.5X.1cm Right posterior flank with full thickness wound; 10% red and moist, 90% yellow., 3X10X.2cm; location is not consistent with a pressure injury.   Right upper buttock with pink moist Stage 2 pressure injury; 1X1X.1cm Inner gluteal fold with partial thickness fissure related to moisture; 1X.1X.1cm Inner buttocks near rectum with red moist macerated partial thickness skin loss; affected area is approx 1X1X.1cm.  It is difficult to keep the affected area from becoming soiled realted to patient is incontinent of loose stools; Flexiseal is intact to attempt to contain stool but still leaks around the insertion site.  ICD-10 CM Codes for Irritant Dermatitis L24A2 - Due to fecal, urinary or dual incontinence L24A9 - Due to friction or contact with other specified body fluids  Dressing procedure/placement/frequency: Topical treatment orders provided for bedside nurses to perform to assist with removal of nonviable tissue as follows: 1. Apply Santyl to right posterior flank wound Q day, then cover with moist gauze and foam dressing.  (Change foam dressing Q 3 days or PRN soiling.) 2. Foam dressing to sacrum and back wounds, change Q 3 days or PRN soiling  Please re-consult if further assistance is needed.  Thank-you,   Julien Girt MSN, [place], [place], [place], [place]

## 2021-06-07 NOTE — Progress Notes (Signed)
NAME:  Alexandra Foster, MRN:  [id number], DOB:  06-06-1950, LOS: 5 ADMISSION DATE:  05/04/2021, CONSULTATION DATE:  05/04/2021 REFERRING MD:  Duffy Bruce MD CHIEF COMPLAINT:  Left leg pain   HPI  71 y.o female with medical history significant of Asthma/COPD, T2DM, HTN, OSA on CPAP, GERD, COVID-19 pneumonia, and Bilateral lower extremity edema who presented to the ED with chief complaints of LLE pain and chills since Thursday.  Patient report onset of symptoms for the past several days with worsening pain, swelling, fevers and chills, poor po intake and difficulty walking since Thursday. Denies injury to extremity or hx of cellulitis.  ED Course: In the emergency department, the temperature was 37.7C, the heart rate 103 beats/minute, the blood pressure 75/64  mm Hg, the respiratory rate 27 breaths/minute, and the oxygen saturation 99% on RA. CT of left lower leg showed diffuse subcutaneous edema concerning for cellulitis with no fluid collection/abscess. Pertinent Labs in Red/Diagnostics Findings: Na+/ K+: 141/2.9 Glucose: 144 BUN/Cr.: 44/3.47 AST/ALT:67/48   WBC/ TMAX: 16.5/ afebrile PCT: pending Lactic acid: 3.8>6.0 COVID PCR: Negative  Patient given 30 cc/kg of fluids and started on broad-spectrum antibiotics for sepsis with septic shock. Patient remained hypotensive despite IVF boluses therefore was started on Levophed. PCCM consulted.  Past Medical History    Arthritis   Asthma   COPD (chronic obstructive pulmonary disease) ([place])   Diabetes mellitus without complication ([place])   Endometriosis   GERD (gastroesophageal reflux disease)   Hypertension   Obesity   Sleep apnea    CPAP   Significant Hospital Events   1/21: Admitted to the ICU with severe sepsis with shock secondary to cellulitis of LLE 1/23: off pressors +Afib with RVR 1/24: OR for fasciotomy 1/25: post op resp failure with severe septic shock 1/26: remains on pressors 1/27: returns to the OR today for  debridement, wound VAC 1/28: overnight with hemodynamic deterioration, required reintubation, currently on pressors 1/29: remains on 3 pressors, CRRT, wound VAC and minimal sedation.  Awakens to voice and touch.  Seen by vascular surgery and awaiting input from general surgery regarding amputation of left leg 2/1: remains on pressors, on CRRT, MRI/CT shows SAH, afib with RVR, ECHO pending, restarted AMIO at 60 mg/hr 2/3: remains on crrt, remains on vent, plan for [facility] and wound vac change in OR 2/3: S/P TRACH, omplantation of cellular matrix on left leg 2/4: REMAINS ON PRESSORS, REMAINS ON VENT, REMAINS ON CRRT 2/5: REMAINS ON VENT, OFF PRESSORS, REMAINS ON CRRT 2/6: Patient is improved she is able to follow communication verbally and move extermities. She is on 5L/min Trache collar. We have stopped levophed and working on reducing vasopressin. She remains on CRRT.  Insulin infusion is ongoing and plan to have weight based regimen initiated. Additionally she will have SLP for vocalization and PMV trial as well as ROM training with OT/PT.  Multiple specialists on case appreciate everyone involved.  2/7: patient is improved mildly , she's off CRRT, shes off insulin drip. She is mentating.  She was evaluated by [facility].  She was unable to vocalize with PMV.  She may need PEG tube for nourishment.  2/8: patient is overnight worsened with fever and increased levophed dose.  She is negative 1.7L.   She is on zosyn.  2/9: patient is improved this am. She is giving thumbs up during examination. 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 She seems volume depleted  as well so we will start CVP monitoring and give IV LR 1L bolus today to check for volume responsiveness. Albumin also while on CRRT. PT/OT starting today. Type and screen repeat due to plan for OR in am. On zosyn with ID following. Starting [place] heparin today.   2/10: patient is s/p repeat surgery on RLE. She is on levophed 56mg post surgery. She is not tachycardic and able to follow some verbal communication.  2/11: patient stable overnight. Reordering PT/OT today. Wbc count slightly trending up remains on abx probably post surgical.  Remains severely anemic on CRRT and vasopressor support with tracheostomy.  2/12: patient with severe anemia today.  Reviewed with renal team for blood transfusion today.  2/13: remains critically ill, on pressors and CRRT 2/13: trach changed for a #8 cuffed distal XLT without difficulty. Wound cleaner. PER ENT 2/14: remains on pressors, CRRT, VENT, plan for PEG and WOUND vac change at bedside; received 1 unit of PRBC's due to anemia  2/15: s/p IVC filter placement due to inability to tolerate anticoagulation  2/16: plan for Trach collar trials as tolerated.  Continues on CRRT. Pt refusing nursing care and to work with PT/OT, Palliative Care re-consulted 2/17: Pt seems depressed, refusing nursing care.  Consult Psychiatry. 2/18: Dialysis filter continues to clot requiring filter changes with inability to return blood back.  Hgb 6.4 pt received 1 unit pRBC's.  Increased amiodarone frequency from 200 mg daily to bid due to pt converting in and out of atrial fibrillation with rvr  2/19: CRRT discontinued due to frequent clotting of circuit resulting in acute blood loss anemia due to inability to return blood back to pt  2/20: Pt currently off of vasopressors following iv fluid resuscitation  2/21: TCT for 30 hrs 2/22 TCT for 48 hrs, tolerated HD without pressors 2/23: General surgery discussing left AKA with pts family~pt with major stenotic complications on the ligaments and tendons resulting in difficult case for skin grafting in the future.  Vascular consulted for possible left AKA   Consults:  PCCM General Surgery  Nephrology ID  Speech  ENT   Significant Diagnostic Tests:  1/21: Chest Xray> no active  cardiopulmonary process 1/21: CT left lower leg>Diffuse subcutaneous edema may represent cellulitis. No drainable fluid collection/abscess 1/21: Ultrasound lower unilateral left> no DVT  Micro Data:  1/21: SARS-CoV-2 PCR>>negative 1/21: Influenza PCR>>negative 1/21: Blood culture x2>>group A strep (strep pyogenes) 1/21: Urine Culture>negative  1/21: MRSA PCR>>negative  1/24: Left leg wound culture>>negative  2/7: Trach site would culture>>few pseudomonas aeruginosa and few candida albicans  2/8: MRSA PCR>> negative  Antimicrobials:  Vancomycin 1/21>>1/22 Cefepime 1/21>>01/22 Clindamycin 1/22>>1/25 Ceftriaxone 1/21 X1; restarted 1/31>>2/6 Penicillin G 1/22>>1/31 Linezolid 1/25>>1/31 Metronidazole 02/6 x1 dose Unasyn 2/7>>2/7 Zosyn 2/7>>2/17  Subjective:  Pt intermittently following commands no acute events overnight   OBJECTIVE  Blood pressure (!) 93/44, pulse 85, temperature 98.2 F (36.8 C), temperature source Axillary, resp. rate 17, height '5\' 5"'  (1.651 m), weight 116.6 kg, SpO2 99 %.    FiO2 (%):  [28 %] 28 %   Intake/Output Summary (Last 24 hours) at 06/07/2021 1017 Last data filed at 06/07/2021 1000 Gross per 24 hour  Intake 1770.83 ml  Output 1926 ml  Net -155.17 ml   Filed Weights   06/06/21 1000 06/06/21 1449 06/07/21 0313  Weight: 117.7 kg 119.1 kg 116.6 kg     Physical Examination  General: acute on chronically ill appearing female, NAD resting in bed on vent HENT: supple, no JVD,  size 8 XLT tracheostomy present dressing dry/intact  Lungs: diminished on the right rhonchi all other lobes, even, non labored  Cardiovascular: nsr, rrr, no M/R/G, 2+ radial/2+ distal pulses Abdomen: +BS x4, obese, soft, non tender, non distended  Extremities: LLE negative pressure dressing present clean dry and intact Neuro: Awake, intermittently follows commands, generalized weakness (but no focal deficits) PERRL.  Affect is very flat GU: deferred anuric  Skin: see below     Labs/imaging that I havepersonally reviewed  (right click and "Reselect all SmartList Selections" daily)  All labs reviewed 05/31/2021   Labs   CBC: Recent Labs  Lab 06/03/21 0534 06/04/21 0438 06/05/21 0351 06/06/21 0521 06/06/21 1615 06/07/21 0421  WBC 9.5 7.7 8.5 8.7  --  12.6*  NEUTROABS 8.5* 6.7 7.0 6.3  --  9.1*  HGB 7.9* 7.4* 7.3* 6.9* 8.7* 8.4*  HCT 25.5* 24.2* 23.2* 21.7* 27.5* 26.2*  MCV 92.1 91.3 89.6 88.6  --  87.3  PLT 158 198 225 255  --  861    Basic Metabolic Panel: Recent Labs  Lab 06/02/21 0503 06/03/21 0534 06/04/21 0438 06/05/21 0351 06/06/21 0521 06/07/21 0421  NA 133* 133* 132* 130* 131* 131*  K 4.2 4.6 6.0* 4.4 5.1 4.5  CL 101 101 98 94* 95* 91*  CO2 24 25 21* '23 23 26  ' GLUCOSE 193* 161* 189* 194* 158* 83  BUN 56* 77* 98* 97* 152* 80*  CREATININE 1.31* 1.83* 2.86* 2.11* 2.95* 2.12*  CALCIUM 9.3 9.6 9.4 8.9 9.5 9.2  MG 2.1 2.0 2.2 2.0  --  2.0  PHOS 2.6 3.3 4.6 4.1 4.9* 5.3*   GFR: Estimated Creatinine Clearance: 30.6 mL/min (A) (by C-G formula based on SCr of 2.12 mg/dL (H)). Recent Labs  Lab 06/01/21 1830 06/01/21 2200 06/02/21 0503 06/02/21 0815 06/02/21 1111 06/03/21 0534 06/04/21 0438 06/05/21 0351 06/06/21 0521 06/07/21 0421  WBC  --   --    < >  --   --    < > 7.7 8.5 8.7 12.6*  LATICACIDVEN 3.0* 2.7*  --  3.9* 3.0*  --   --   --   --   --    < > = values in this interval not displayed.    Liver Function Tests: Recent Labs  Lab 06/03/21 0534 06/04/21 0438 06/05/21 0351 06/06/21 0521 06/07/21 0421  AST  --   --   --   --  81*  ALT  --   --   --   --  118*  ALKPHOS  --   --   --   --  193*  BILITOT  --   --   --   --  1.6*  PROT  --   --   --   --  5.7*  ALBUMIN 2.9* 2.8* 3.1* 3.0* 2.7*   2.8*   No results for input(s): LIPASE, AMYLASE in the last 168 hours. No results for input(s): AMMONIA in the last 168 hours.  ABG    Component Value Date/Time   PHART 7.40 05/14/2021 0341   PCO2ART 45 05/14/2021  0341   PO2ART 129 (H) 05/14/2021 0341   HCO3 27.9 05/14/2021 0341   ACIDBASEDEF 12.8 (H) 05/11/2021 0603   O2SAT 98.9 05/14/2021 0341     Coagulation Profile: No results for input(s): INR, PROTIME in the last 168 hours.   Cardiac Enzymes: No results for input(s): CKTOTAL, CKMB, CKMBINDEX, TROPONINI in the last 168 hours.  HbA1C: Hgb A1c MFr Bld  Date/Time  Value Ref Range Status  05/05/2021 04:53 AM 6.7 (H) 4.8 - 5.6 % Final    Comment:    (NOTE)         Prediabetes: 5.7 - 6.4         Diabetes: >6.4         Glycemic control for adults with diabetes: <7.0   02/11/2021 09:41 AM 6.3 (H) <5.7 % of total Hgb Final    Comment:    For someone without known diabetes, a hemoglobin  A1c value between 5.7% and 6.4% is consistent with prediabetes and should be confirmed with a  follow-up test. . For someone with known diabetes, a value <7% indicates that their diabetes is well controlled. A1c targets should be individualized based on duration of diabetes, age, comorbid conditions, and other considerations. . This assay result is consistent with an increased risk of diabetes. . Currently, no consensus exists regarding use of hemoglobin A1c for diagnosis of diabetes for children. .     CBG: Recent Labs  Lab 06/06/21 2034 06/07/21 0000 06/07/21 0318 06/07/21 0401 06/07/21 0743  GLUCAP 71 65* 75 95 72    Review of Systems:   Unable to assess pt mechanically ventilated via tracheostomy  Past Medical History  She,  has a past medical history of AKI (acute kidney injury) ([place]), Arthritis, Asthma, Bacteremia, COPD (chronic obstructive pulmonary disease) ([place]), Diabetes mellitus without complication ([place]), Endometriosis, GERD (gastroesophageal reflux disease), History of echocardiogram, Hypertension, Obesity, PAF (paroxysmal atrial fibrillation) ([place]), and Sleep apnea.   Surgical History    Past Surgical History:  Procedure Laterality Date   ABDOMINAL HYSTERECTOMY      APPLICATION OF WOUND VAC  05/10/2021   Procedure: APPLICATION OF WOUND VAC;  Surgeon: Herbert Pun, MD;  Location: [facility] OR[facility];  Service: General;;   APPLICATION OF WOUND VAC Left 05/17/2021   Procedure: APPLICATION OF WOUND VAC/WOUND VAC EXCHANGE-Matrix Myriad;  Surgeon: Herbert Pun, MD;  Location: [facility];  Service: General;  Laterality: Left;   APPLICATION OF WOUND VAC  05/24/2021   Procedure: APPLICATION OF WOUND VAC;  Surgeon: Herbert Pun, MD;  Location: [facility];  Service: General;;   COLONOSCOPY  10/29/2006   Dr Allen Norris   COLONOSCOPY WITH PROPOFOL N/A 11/05/2016   Procedure: COLONOSCOPY WITH PROPOFOL;  Surgeon: Robert Bellow, MD;  Location: [facility];  Service: Endoscopy;  Laterality: N/A;   INCISION AND DRAINAGE OF WOUND Left 05/10/2021   Procedure: IRRIGATION AND DEBRIDEMENT LEFT LEG;  Surgeon: Herbert Pun, MD;  Location: [facility];  Service: General;  Laterality: Left;   INCISION AND DRAINAGE OF WOUND Left 05/24/2021   Procedure: IRRIGATION AND DEBRIDEMENT LEFT LEG;  Surgeon: Herbert Pun, MD;  Location: [facility];  Service: General;  Laterality: Left;   IVC FILTER INSERTION N/A 05/29/2021   Procedure: IVC FILTER INSERTION;  Surgeon: Katha Cabal, MD;  Location: [place] [facility];  Service: Cardiovascular;  Laterality: N/A;   MINOR GRAFT APPLICATION  08/10/7679   Procedure: Myriad Matrix  APPLICATION;  Surgeon: Herbert Pun, MD;  Location: [facility];  Service: General;;   NASAL SINUS SURGERY  2002   Dr Carlis Abbott   PEG PLACEMENT N/A 05/28/2021   Procedure: PERCUTANEOUS ENDOSCOPIC GASTROSTOMY (PEG) PLACEMENT;  Surgeon: Benjamine Sprague, DO;  Location: [facility] END[facility];  Service: General;  Laterality: N/A;  TRAVEL CASE   TRACHEOSTOMY TUBE PLACEMENT N/A 05/17/2021   Procedure: TRACHEOSTOMY;  Surgeon: Clyde Canterbury, MD;  Location: [facility];  Service: ENT;  Laterality: N/A;   WOUND DEBRIDEMENT  Left 05/07/2021   Procedure: DEBRIDEMENT WOUND;   Surgeon: Herbert Pun, MD;  Location: [facility];  Service: General;  Laterality: Left;     Social History   reports that she quit smoking about 20 years ago. Her smoking use included cigarettes. She has a 80.00 pack-year smoking history. She quit smokeless tobacco use about 20 years ago.  Her smokeless tobacco use included snuff. She reports that she does not drink alcohol and does not use drugs.   Family History   Her family history includes Alcohol abuse in her brother; Breast cancer (age of onset: 92) in her sister; Congestive Heart Failure in her mother; Coronary artery disease (age of onset: 58) in her father; Heart disease in her brother; Varicose Veins in her brother.   Allergies No Known Allergies   Home Medications  Prior to Admission medications   Medication Sig Start Date End Date Taking? Authorizing Provider  albuterol (PROVENTIL) (2.5 MG/3ML) 0.083% nebulizer solution Take 3 mLs (2.5 mg total) by nebulization every 6 (six) hours as needed for wheezing or shortness of breath. 10/03/19   Steele Sizer, MD  aspirin 81 MG tablet Take 1 tablet (81 mg total) by mouth daily. 01/08/16   Steele Sizer, MD  atorvastatin (LIPITOR) 40 MG tablet Take 1 tablet (40 mg total) by mouth daily. 02/11/21   Steele Sizer, MD  azelastine (ASTELIN) 0.1 % nasal spray Place 2 sprays into both nostrils 2 (two) times daily as needed for rhinitis or allergies. 07/31/20   Delsa Grana, PA-C  baclofen (LIORESAL) 10 MG tablet Take 10 mg by mouth daily. 04/16/21   [provider]  calcium carbonate (OSCAL) 1500 (600 Ca) MG TABS tablet Take 600 mg of elemental calcium by mouth 2 (two) times daily with a meal.    [provider]  celecoxib (CELEBREX) 200 MG capsule Take 200 mg by mouth daily. 04/03/21   [provider]  Cetirizine HCl (ZYRTEC ALLERGY) 10 MG TBDP Take 10 mg by mouth at bedtime. For sinus symptoms and seasonal allergies 07/31/20   Delsa Grana, PA-C  docusate  sodium (COLACE) 100 MG capsule Take 100 mg by mouth daily.    [provider]  fluticasone Asencion Islam) 50 MCG/ACT nasal spray  08/31/20   [provider]  Fluticasone-Umeclidin-Vilant (TRELEGY ELLIPTA) 100-62.5-25 MCG/INH AEPB Inhale 1 puff into the lungs daily. In place of Stiolto 02/10/20   Steele Sizer, MD  glucose blood (ACCU-CHEK AVIVA PLUS) test strip Use as instructed 12/08/17   Steele Sizer, MD  Lancets (ACCU-CHEK SOFT TOUCH) lancets Use as instructed 12/08/17   Steele Sizer, MD  loratadine (CLARITIN) 10 MG tablet Take 1 tablet (10 mg total) by mouth daily. 02/11/21   Steele Sizer, MD  meloxicam (MOBIC) 15 MG tablet Take 15 mg by mouth daily. 04/23/21   [provider]  metFORMIN (GLUCOPHAGE-XR) 750 MG 24 hr tablet Take 1 tablet (750 mg total) by mouth daily with breakfast. 02/11/21   Ancil Boozer, Drue Stager, MD  montelukast (SINGULAIR) 10 MG tablet Take 1 tablet (10 mg total) by mouth at bedtime. 02/11/21   Steele Sizer, MD  Multiple Vitamins-Minerals (MULTIVITAL PO) Take 1 tablet by mouth daily.    [provider]  omeprazole (PRILOSEC) 20 MG capsule Take 1 capsule (20 mg total) by mouth daily. 02/11/21   Steele Sizer, MD  [place] 30 MG/2ML SOSY  10/09/20   [provider]  triamterene-hydrochlorothiazide (DYAZIDE) 37.5-25 MG capsule TAKE 1 CAPSULE EVERY DAY 05/02/21   Steele Sizer, MD  vitamin  B-12 (CYANOCOBALAMIN) 100 MCG tablet Take 100 mcg by mouth daily.    [provider]  Scheduled Meds:  amiodarone  200 mg Per Tube BID   vitamin C  500 mg Per Tube BID   chlorhexidine gluconate (MEDLINE KIT)  15 mL Mouth Rinse BID   Chlorhexidine Gluconate Cloth  6 each Topical Q0600   cholecalciferol  2,000 Units Per Tube Daily   collagenase   Topical Daily   Darbepoetin Alfa  100 mcg Subcutaneous Q7 days   escitalopram  10 mg Per Tube Daily   free water  30 mL Per Tube Q4H   insulin aspart  0-20 Units Subcutaneous Q4H   insulin  aspart  8 Units Subcutaneous Q4H   insulin detemir  23 Units Subcutaneous BID   ipratropium-albuterol  3 mL Nebulization TID   lactobacillus  1 g Per Tube TID WC   lip balm   Topical TID   magic mouthwash w/lidocaine  10 mL Oral QID   mouth rinse  15 mL Mouth Rinse 10 times per day   metoCLOPramide (REGLAN) injection  5 mg Intravenous Q8H   midodrine  10 mg Per Tube TID WC   multivitamin  1 tablet Per Tube QHS   nutrition supplement (JUVEN)  1 packet Per Tube BID BM   pantoprazole sodium  40 mg Per Tube Daily   sodium chloride flush  10-40 mL Intracatheter Q12H   valACYclovir  500 mg Per Tube Daily   vitamin A  10,000 Units Per Tube Daily   Continuous Infusions:  sodium chloride Stopped (06/03/21 0902)   sodium chloride     dextrose 20 mL/hr at 06/07/21 1000   erythromycin Stopped (06/07/21 0647)   feeding supplement (PIVOT 1.5 CAL) 1,000 mL (06/07/21 0734)   norepinephrine (LEVOPHED) Adult infusion Stopped (06/07/21 0400)   PRN Meds:.sodium chloride, acetaminophen, albuterol, artificial tears, clonazePAM, HYDROmorphone (DILAUDID) injection, loperamide, ondansetron (ZOFRAN) IV, oxyCODONE, sennosides, sodium chloride flush  Assessment & Plan:   Ventilator Dependent Respiratory Failure s/p tracheostomy  ATELECTASIS, CHF PULMONARY EDEMA AND PLEURAL EFFUSIONS ALL PRESENT ON ADMISSION Hx: COPD, OSA, and Obesity - Trach collar trials as tolerated with mechanical ventilation qhs for now  - Wean Fio2 and PEEP as tolerated - VAP/VENT bundle implementation - Wean PEEP & FiO2 as tolerated, maintain SpO2 >88% - VAP protocol in place - Plateau pressures less than 30 cm H20  - Intermittent chest x-ray & ABG PRN - Ensure adequate pulmonary hygiene   - Per speech therapy trach will need to be downsized to size 6 when pt able to tolerate to be considered for PMV therapy    Intermittent atrial fibrillation with rvr~improving  - Continuous cardiac monitoring - Continue scheduled midodrine    - Continue amiodarone   Left necrotizing fascitis (strep pyogenes)-present on admission s/p multiple debridements, application of wound VAC system, s/p myriad matrix placement 05/17/2021 Streptococcal toxic shock syndrome Pseudomonas in Tracheal wound culture (? respiratory secretions coating the tracheostomy) - Monitor fever curve - Trend WBC's  - Completed full course of abx therapy~02/17  - General Surgery following, appreciate input ~ wound care as per surgery - Vascular Surgery consulted for possible left AKA awaiting pts family decision regarding this   Acute kidney injury secondary to ischemic nephropathy in the setting of hypotension complicated with sepsis  - Avoid nephrotoxic agents - Follow urine output, BMP - Ensure adequate renal perfusion, optimize oxygenation - Renal dose medications - Nephrology consulted appreciate input~pt now tolerating HD  Shock liver   - Monitor hepatic function panel   - Monitor coags   Acute on chronic anemia~pt unable to tolerate chemical anticoagulation s/p IVC filter placement 05/29/2021 -Monitor for S/Sx of bleeding -Trend CBC -Unable to tolerate anticoagulants or SCD's for VTE Prophylaxis ~ IVC filter placed 2/15 -Transfuse for Hgb <7  Type II diabetes mellitus  -CBG's q4h; Target range of 140 to 180 -SSI -Follow ICU Hypo/Hyperglycemia protocol    Toxic metabolic encephalopathy~improving  Small SAH  Appears Depressed NO ANTICOAGULATION FOR  1 MONTH AND THEN NEED TO RE-ASSESS NO ANTIPLATELET AGENTS FOR 1 MONTH AND THEN NEED TO RE-ASSESS CAN POSSIBLY CONSIDER BABY ASA BUT WOULD NEED TO PROCEED WITH CAUTION -Continue Lexapro  Best practice:  Diet: Tube Feeds  Pain/Anxiety/Delirium protocol (if indicated): scheduled and prn oxycodone  VAP protocol (if indicated): Ordered  DVT prophylaxis: Unable to tolerate chemical prophylaxis s/p IVC filter placement  GI prophylaxis: PPI Glucose control:  SSI Yes Central venous access: Left  IJ Trialysis Catheter Arterial line:  N/A Foley:  N/A Mobility:  bed rest  PT consulted: N/A Last date of multidisciplinary goals of care discussion [06/07/21] Code Status:  full code Disposition: ICU   = Goals of Care = Code Status Order: ICU  Primary Emergency ContactKISTA, ROBB, Home Phone: [phone] Will update family once they arrive at bedside   Critical care time: 27 minutes    Rosilyn Mings, [place] Pager [phone] (please enter 7 digits) PCCM Consult Pager [phone] (please enter 7 digits)

## 2021-06-07 NOTE — Progress Notes (Signed)
Nutrition Follow Up Note   DOCUMENTATION CODES:   Morbid obesity  INTERVENTION:   Pivot 1.5_0 /hr- initiate at 71m/hr, once tolerating advance by 112mhr q 8 hours until goal rate is reached.   Free water flushes 3037m4 hours to maintain tube patency   Regimen provides 2340kcal/day, 146g/day protein and 1364m29my of free water   Continue Juven Fruit Punch BID via tube, each serving provides 95kcal and 2.5g of protein (amino acids glutamine and arginine)  Continue Rena-vit daily via tube   Continue Vitamin C 500mg60m via tube   Continue Vitamin A 10,000 units daily via tube   Continue Cholecalciferol 2000 units daily via tube   Copper and vitamin A labs pending   NUTRITION DIAGNOSIS:   Increased nutrient needs related to wound healing as evidenced by estimated needs.  GOAL:   Provide needs based on ASPEN/SCCM guidelines -met with tube feeds   MONITOR:   Labs, Weight trends, TF tolerance, Skin, I & O's, Vent status  ASSESSMENT:   70 y.66 female with a history of asthma/COPD, HTN, DMII, obesity, OSA on CPAP, GERD and COVID-19 pneumonia who was admitted 1/22 w/ left LE cellulitis, septic shock, S pyogenes bacteremia, hypotension, new Afib and AKI.  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 PEG tube in place. Pt with another episode of vomiting overnight. Tube feeds held overnight but were restarted this morning at 30ml/32mKUB from this morning reports no ileus or obstruction. RD suspects vomiting is related to gastroparesis. Erythromycin started yesterday; reglan added today. Will increase tube feeds back to goal rate over the next 24-48hrs. Pt continues to have diarrhea;  imodium ordered as needed. Per chart, pt appears weight stable since admission. Pt +3.8L on her I & Os. Pt remains on HD. Family deciding about AKA.   Medications reviewed and include: vitamin C, D3, darbepoetin, heparin, insulin, lactobacillus, reglan, rena-vit, juven, protonix, vitamin A, erythromycin, 10% dextrose _1 /hr  Labs reviewed: Na 131(L), K 4.5 wnl, BUN 80(H), creat 2.12(H), P 5.3(H), Mg 2.0 wnl Hgb 8.4(L), Hct 26.2(L) Cbgs- 108, 72, 85, 75, 65 x 24hrs  MAP- >65mmHg72mrains- 400ml   48m 375ml   D42mOrder:   Diet Order     None      EDUCATION NEEDS:   Education needs have been addressed  Skin: WOC assessment reviewed: 40 x 35 cm left leg wound with VAC, lower lip .5X.5cm, upper lip 1X1.5 cm, middle back 1.5X1.5X.1cm, right posterior flank 3X10X.2cm, right upper buttock Stage 2 pressure injury; 1X1X.1cm, inner gluteal fold  1X.1X.1cm, inner buttocks1X1X.1cm.  Last BM:  2/24- 200ml via 79mmy  Height:   Ht Readings from Last 1 Encounters:  05/31/21 _2  (1.651 m)    Weight:   Wt Readings from Last 1 Encounters:  06/07/21 116.6 kg    Ideal Body Weight:  56.8 kg  BMI:  Body mass index is 42.78 kg/m.  Estimated Nutritional Needs:   Kcal:  2200-2500kcal/day  Protein:  120-140 grams  Fluid:  1.7-2.0L/day  Keyasha Miah CampKoleen DistanceDN Please refer to [facility]/or RD on-call/weekend/after hours pager

## 2021-06-07 NOTE — Progress Notes (Addendum)
Inpatient Diabetes Program Recommendations  AACE/ADA: New Consensus Statement on Inpatient Glycemic Control (2015)  Target Ranges:  Prepandial:   less than 140 mg/dL      Peak postprandial:   less than 180 mg/dL (1-2 hours)      Critically ill patients:  140 - 180 mg/dL    Latest Reference Range & Units 06/05/21 23:28 06/06/21 03:20 06/06/21 07:34 06/06/21 12:41 06/06/21 16:37 06/06/21 20:34  Glucose-Capillary 70 - 99 mg/dL 168 (H)  12 units Novolog  151 (H)  12 units Novolog  82  Novolog HELD  23 units Levemir @0921  103 (H)  Novolog HELD 116 (H)  Novolog HELD 71  Novolog HELD    Latest Reference Range & Units 06/07/21 00:00 06/07/21 03:18 06/07/21 04:01  Glucose-Capillary 70 - 99 mg/dL 65 (L)  Novolog HELD 75   95  Novolog HELD     Current Orders: Levemir 23 units BID Novolog 0-20 units Q4H  Novolog 8 units Q4H for Tube feed Coverage     MD- Note Hypoglycemia at Midnight last PM.    Levemir dose was HELD that was due at 10pm.  Please consider:   1. Reduce the Levemir to 12 units BID (50% current dose)  2. Reduce Novolog SSI to the 0-9 unit (Sensitive) scale Q4 hours  3. Reduce Novolog Tube Feed Coverage to 4 units Q4 hours     --Will follow patient during hospitalization--  Wyn Quaker RN, MSN, CDE Diabetes Coordinator Inpatient Glycemic Control Team Team Pager: [phone] (8a-5p)

## 2021-06-08 DIAGNOSIS — A419 Sepsis, unspecified organism: Secondary | ICD-10-CM | POA: Diagnosis not present

## 2021-06-08 LAB — CBC WITH DIFFERENTIAL/PLATELET
Abs Immature Granulocytes: 1.04 10*3/uL — ABNORMAL HIGH (ref 0.00–0.07)
Basophils Absolute: 0.1 10*3/uL (ref 0.0–0.1)
Basophils Relative: 0 %
Eosinophils Absolute: 0.2 10*3/uL (ref 0.0–0.5)
Eosinophils Relative: 2 %
HCT: 24.2 % — ABNORMAL LOW (ref 36.0–46.0)
Hemoglobin: 7.8 g/dL — ABNORMAL LOW (ref 12.0–15.0)
Immature Granulocytes: 9 %
Lymphocytes Relative: 11 %
Lymphs Abs: 1.3 10*3/uL (ref 0.7–4.0)
MCH: 28.2 pg (ref 26.0–34.0)
MCHC: 32.2 g/dL (ref 30.0–36.0)
MCV: 87.4 fL (ref 80.0–100.0)
Monocytes Absolute: 0.8 10*3/uL (ref 0.1–1.0)
Monocytes Relative: 6 %
Neutro Abs: 8.9 10*3/uL — ABNORMAL HIGH (ref 1.7–7.7)
Neutrophils Relative %: 72 %
Platelets: 259 10*3/uL (ref 150–400)
RBC: 2.77 MIL/uL — ABNORMAL LOW (ref 3.87–5.11)
RDW: 18.2 % — ABNORMAL HIGH (ref 11.5–15.5)
Smear Review: NORMAL
WBC: 12.3 10*3/uL — ABNORMAL HIGH (ref 4.0–10.5)
nRBC: 2.7 % — ABNORMAL HIGH (ref 0.0–0.2)

## 2021-06-08 LAB — RENAL FUNCTION PANEL
Albumin: 2.4 g/dL — ABNORMAL LOW (ref 3.5–5.0)
Anion gap: 17 — ABNORMAL HIGH (ref 5–15)
BUN: 100 mg/dL — ABNORMAL HIGH (ref 8–23)
CO2: 23 mmol/L (ref 22–32)
Calcium: 9.1 mg/dL (ref 8.9–10.3)
Chloride: 90 mmol/L — ABNORMAL LOW (ref 98–111)
Creatinine, Ser: 2.85 mg/dL — ABNORMAL HIGH (ref 0.44–1.00)
GFR, Estimated: 17 mL/min — ABNORMAL LOW (ref >60)
Glucose, Bld: 102 mg/dL — ABNORMAL HIGH (ref 70–99)
Phosphorus: 6.9 mg/dL — ABNORMAL HIGH (ref 2.5–4.6)
Potassium: 5.2 mmol/L — ABNORMAL HIGH (ref 3.5–5.1)
Sodium: 130 mmol/L — ABNORMAL LOW (ref 135–145)

## 2021-06-08 LAB — GLUCOSE, CAPILLARY
Glucose-Capillary: 102 mg/dL — ABNORMAL HIGH (ref 70–99)
Glucose-Capillary: 98 mg/dL (ref 70–99)
Glucose-Capillary: 101 mg/dL — ABNORMAL HIGH (ref 70–99)
Glucose-Capillary: 121 mg/dL — ABNORMAL HIGH (ref 70–99)
Glucose-Capillary: 143 mg/dL — ABNORMAL HIGH (ref 70–99)
Glucose-Capillary: 154 mg/dL — ABNORMAL HIGH (ref 70–99)

## 2021-06-08 LAB — MAGNESIUM: Magnesium: 2.2 mg/dL (ref 1.7–2.4)

## 2021-06-08 MED ORDER — HEPARIN SODIUM (PORCINE) 1000 UNIT/ML IJ SOLN
INTRAMUSCULAR | Status: AC
  Administered 2021-06-08: 2800 [IU]
  Filled 2021-06-08: qty 10

## 2021-06-08 NOTE — Progress Notes (Signed)
Updated pt's daughter Berenice Primas at beside.  All questions answered.  Family is still in process of deciding which direction to go as far as amputation vs. Further debridements/wound care of LLE.     Darel Hong, AGACNP-BC [facility] Prefer epic messenger for cross cover needs If after hours, please call E-link

## 2021-06-08 NOTE — Progress Notes (Signed)
NAME:  Alexandra Foster, MRN:  [id number], DOB:  Mar 14, 1951, LOS: 44 ADMISSION DATE:  05/04/2021, CONSULTATION DATE:  05/04/2021 REFERRING MD:  Duffy Bruce MD CHIEF COMPLAINT:  Left leg pain   HPI  71 y.o female with medical history significant of Asthma/COPD, T2DM, HTN, OSA on CPAP, GERD, COVID-19 pneumonia, and Bilateral lower extremity edema who presented to the ED with chief complaints of LLE pain and chills since Thursday.  Patient report onset of symptoms for the past several days with worsening pain, swelling, fevers and chills, poor po intake and difficulty walking since Thursday. Denies injury to extremity or hx of cellulitis.  ED Course: In the emergency department, the temperature was 37.7C, the heart rate 103 beats/minute, the blood pressure 75/64  mm Hg, the respiratory rate 27 breaths/minute, and the oxygen saturation 99% on RA. CT of left lower leg showed diffuse subcutaneous edema concerning for cellulitis with no fluid collection/abscess. Pertinent Labs in Red/Diagnostics Findings: Na+/ K+: 141/2.9 Glucose: 144 BUN/Cr.: 44/3.47 AST/ALT:67/48   WBC/ TMAX: 16.5/ afebrile PCT: pending Lactic acid: 3.8>6.0 COVID PCR: Negative  Patient given 30 cc/kg of fluids and started on broad-spectrum antibiotics for sepsis with septic shock. Patient remained hypotensive despite IVF boluses therefore was started on Levophed. PCCM consulted.  Past Medical History    Arthritis   Asthma   COPD (chronic obstructive pulmonary disease) ([place])   Diabetes mellitus without complication ([place])   Endometriosis   GERD (gastroesophageal reflux disease)   Hypertension   Obesity   Sleep apnea    CPAP   Significant Hospital Events   1/21: Admitted to the ICU with severe sepsis with shock secondary to cellulitis of LLE 1/23: off pressors +Afib with RVR 1/24: OR for fasciotomy 1/25: post op resp failure with severe septic shock 1/26: remains on pressors 1/27: returns to the OR today for  debridement, wound VAC 1/28: overnight with hemodynamic deterioration, required reintubation, currently on pressors 1/29: remains on 3 pressors, CRRT, wound VAC and minimal sedation.  Awakens to voice and touch.  Seen by vascular surgery and awaiting input from general surgery regarding amputation of left leg 2/1: remains on pressors, on CRRT, MRI/CT shows SAH, afib with RVR, ECHO pending, restarted AMIO at 60 mg/hr 2/3: remains on crrt, remains on vent, plan for [facility] and wound vac change in OR 2/3: S/P TRACH, omplantation of cellular matrix on left leg 2/4: REMAINS ON PRESSORS, REMAINS ON VENT, REMAINS ON CRRT 2/5: REMAINS ON VENT, OFF PRESSORS, REMAINS ON CRRT 2/6: Patient is improved she is able to follow communication verbally and move extermities. She is on 5L/min Trache collar. We have stopped levophed and working on reducing vasopressin. She remains on CRRT.  Insulin infusion is ongoing and plan to have weight based regimen initiated. Additionally she will have SLP for vocalization and PMV trial as well as ROM training with OT/PT.  Multiple specialists on case appreciate everyone involved.  2/7: patient is improved mildly , she's off CRRT, shes off insulin drip. She is mentating.  She was evaluated by [facility].  She was unable to vocalize with PMV.  She may need PEG tube for nourishment.  2/8: patient is overnight worsened with fever and increased levophed dose.  She is negative 1.7L.   She is on zosyn.  2/9: patient is improved this am. She is giving thumbs up during examination. 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 She seems volume depleted  as well so we will start CVP monitoring and give IV LR 1L bolus today to check for volume responsiveness. Albumin also while on CRRT. PT/OT starting today. Type and screen repeat due to plan for OR in am. On zosyn with ID following. Starting [place] heparin today.   2/10: patient is s/p repeat surgery on RLE. She is on levophed 31mg post surgery. She is not tachycardic and able to follow some verbal communication.  2/11: patient stable overnight. Reordering PT/OT today. Wbc count slightly trending up remains on abx probably post surgical.  Remains severely anemic on CRRT and vasopressor support with tracheostomy.  2/12: patient with severe anemia today.  Reviewed with renal team for blood transfusion today.  2/13: remains critically ill, on pressors and CRRT 2/13: trach changed for a #8 cuffed distal XLT without difficulty. Wound cleaner. PER ENT 2/14: remains on pressors, CRRT, VENT, plan for PEG and WOUND vac change at bedside; received 1 unit of PRBC's due to anemia  2/15: s/p IVC filter placement due to inability to tolerate anticoagulation  2/16: plan for Trach collar trials as tolerated.  Continues on CRRT. Pt refusing nursing care and to work with PT/OT, Palliative Care re-consulted 2/17: Pt seems depressed, refusing nursing care.  Consult Psychiatry. 2/18: Dialysis filter continues to clot requiring filter changes with inability to return blood back.  Hgb 6.4 pt received 1 unit pRBC's.  Increased amiodarone frequency from 200 mg daily to bid due to pt converting in and out of atrial fibrillation with rvr  2/19: CRRT discontinued due to frequent clotting of circuit resulting in acute blood loss anemia due to inability to return blood back to pt  2/20: Pt currently off of vasopressors following iv fluid resuscitation  2/21: TCT for 30 hrs 2/22 TCT for 48 hrs, tolerated HD without pressors 2/23: General surgery discussing left AKA with pts family~pt with major stenotic complications on the ligaments and tendons resulting in difficult case for skin grafting in the future.  Vascular consulted for possible left AKA 2/25: Remains off vent and tolerating TC.  Plan for HD today.  Process for LTAC placement in process.  Pt's family is deciding on whether to  pursue amputation vs. Negative pressure dressing and local care   Consults:  PCCM General Surgery  Nephrology ID  Speech  ENT   Significant Diagnostic Tests:  1/21: Chest Xray> no active cardiopulmonary process 1/21: CT left lower leg>Diffuse subcutaneous edema may represent cellulitis. No drainable fluid collection/abscess 1/21: Ultrasound lower unilateral left> no DVT  Micro Data:  1/21: SARS-CoV-2 PCR>>negative 1/21: Influenza PCR>>negative 1/21: Blood culture x2>>group A strep (strep pyogenes) 1/21: Urine Culture>negative  1/21: MRSA PCR>>negative  1/24: Left leg wound culture>>negative  2/7: [place]line [place] would culture>>few pseudomonas aeruginosa and few candida albicans  2/8: MRSA PCR>> negative  Antimicrobials:  Vancomycin 1/21>>1/22 Cefepime 1/21>>01/22 Clindamycin 1/22>>1/25 Ceftriaxone 1/21 X1; restarted 1/31>>2/6 Penicillin G 1/22>>1/31 Linezolid 1/25>>1/31 Metronidazole 02/6 x1 dose Unasyn 2/7>>2/7 Zosyn 2/7>>2/17  Subjective:  -No significant events noted overnight -Afebrile, hemodynamically stable, remains on/tolerating Trach collar -Pt is more alert, interactive, and cooperative today -Plan for HD today ~ Developed hypotension during HD requiring Levophed -Family to decide on whether to pursue amputation vs. Continued wound care  -Leukocytosis stable at 12.3 (12.6)  OBJECTIVE  Blood pressure (!) 95/41, pulse 80, temperature 99.1 F (37.3 C), resp. rate (!) 21, height _0  (1.651 m), weight 122.9 kg, SpO2 99 %.    FiO2 (%):  [28 %] 28 %  Intake/Output Summary (Last 24 hours) at 06/08/2021 1131 Last data filed at 06/08/2021 0600 Gross per 24 hour  Intake 777.39 ml  Output 325 ml  Net 452.39 ml    Filed Weights   06/06/21 1449 06/07/21 0313 06/08/21 0422  Weight: 119.1 kg 116.6 kg 122.9 kg     Physical Examination  General: acute on chronically ill appearing female, NAD resting in bed on trach collar HENT: supple, no JVD, size 8 XLT  tracheostomy present dressing dry/intact  Lungs: diminished on the right, even, non labored, normal effort Cardiovascular: nsr, rrr, no M/R/G, 2+ radial/2+ distal pulses Abdomen: +BS x4, obese, soft, non tender, non distended  Extremities: LLE negative pressure dressing present clean dry and intact Neuro: Awake, follows commands, generalized weakness (but no focal deficits) PERRL.  Affect is flat GU: deferred anuric  Skin: see below    Labs/imaging that I havepersonally reviewed  (right click and "Reselect all SmartList Selections" daily)  All labs reviewed 06/08/2021   Labs   CBC: Recent Labs  Lab 06/04/21 0438 06/05/21 0351 06/06/21 0521 06/06/21 1615 06/07/21 0421 06/08/21 0430  WBC 7.7 8.5 8.7  --  12.6* 12.3*  NEUTROABS 6.7 7.0 6.3  --  9.1* 8.9*  HGB 7.4* 7.3* 6.9* 8.7* 8.4* 7.8*  HCT 24.2* 23.2* 21.7* 27.5* 26.2* 24.2*  MCV 91.3 89.6 88.6  --  87.3 87.4  PLT 198 225 255  --  266 259     Basic Metabolic Panel: Recent Labs  Lab 06/03/21 0534 06/04/21 0438 06/05/21 0351 06/06/21 0521 06/07/21 0421 06/08/21 0430  NA 133* 132* 130* 131* 131* 130*  K 4.6 6.0* 4.4 5.1 4.5 5.2*  CL 101 98 94* 95* 91* 90*  CO2 25 21* _0 GLUCOSE 161* 189* 194* 158* 83 102*  BUN 77* 98* 97* 152* 80* 100*  CREATININE 1.83* 2.86* 2.11* 2.95* 2.12* 2.85*  CALCIUM 9.6 9.4 8.9 9.5 9.2 9.1  MG 2.0 2.2 2.0  --  2.0 2.2  PHOS 3.3 4.6 4.1 4.9* 5.3* 6.9*    GFR: Estimated Creatinine Clearance: 23.5 mL/min (A) (by C-G formula based on SCr of 2.85 mg/dL (H)). Recent Labs  Lab 06/01/21 1830 06/01/21 2200 06/02/21 0503 06/02/21 0815 06/02/21 1111 06/03/21 0534 06/05/21 0351 06/06/21 0521 06/07/21 0421 06/08/21 0430  WBC  --   --    < >  --   --    < > 8.5 8.7 12.6* 12.3*  LATICACIDVEN 3.0* 2.7*  --  3.9* 3.0*  --   --   --   --   --    < > = values in this interval not displayed.     Liver Function Tests: Recent Labs  Lab 06/04/21 0438 06/05/21 0351 06/06/21 0521  06/07/21 0421 06/08/21 0430  AST  --   --   --  81*  --   ALT  --   --   --  118*  --   ALKPHOS  --   --   --  193*  --   BILITOT  --   --   --  1.6*  --   PROT  --   --   --  5.7*  --   ALBUMIN 2.8* 3.1* 3.0* 2.7*   2.8* 2.4*    No results for input(s): LIPASE, AMYLASE in the last 168 hours. No results for input(s): AMMONIA in the last 168 hours.  ABG    Component Value Date/Time   PHART 7.40 05/14/2021  0341   PCO2ART 45 05/14/2021 0341   PO2ART 129 (H) 05/14/2021 0341   HCO3 27.9 05/14/2021 0341   ACIDBASEDEF 12.8 (H) 05/11/2021 0603   O2SAT 98.9 05/14/2021 0341     Coagulation Profile: No results for input(s): INR, PROTIME in the last 168 hours.   Cardiac Enzymes: No results for input(s): CKTOTAL, CKMB, CKMBINDEX, TROPONINI in the last 168 hours.  HbA1C: Hgb A1c MFr Bld  Date/Time Value Ref Range Status  05/05/2021 04:53 AM 6.7 (H) 4.8 - 5.6 % Final    Comment:    (NOTE)         Prediabetes: 5.7 - 6.4         Diabetes: >6.4         Glycemic control for adults with diabetes: <7.0   02/11/2021 09:41 AM 6.3 (H) <5.7 % of total Hgb Final    Comment:    For someone without known diabetes, a hemoglobin  A1c value between 5.7% and 6.4% is consistent with prediabetes and should be confirmed with a  follow-up test. . For someone with known diabetes, a value <7% indicates that their diabetes is well controlled. A1c targets should be individualized based on duration of diabetes, age, comorbid conditions, and other considerations. . This assay result is consistent with an increased risk of diabetes. . Currently, no consensus exists regarding use of hemoglobin A1c for diagnosis of diabetes for children. .     CBG: Recent Labs  Lab 06/07/21 1946 06/08/21 0046 06/08/21 0404 06/08/21 0724 06/08/21 1107  GLUCAP 106* 102* 98 101* 121*     Review of Systems:   Unable to assess due to tracheostomy  Past Medical History  She,  has a past medical history  of AKI (acute kidney injury) ([place]), Arthritis, Asthma, Bacteremia, COPD (chronic obstructive pulmonary disease) ([place]), Diabetes mellitus without complication ([place]), Endometriosis, GERD (gastroesophageal reflux disease), History of echocardiogram, Hypertension, Obesity, PAF (paroxysmal atrial fibrillation) ([place]), and Sleep apnea.   Surgical History    Past Surgical History:  Procedure Laterality Date   ABDOMINAL HYSTERECTOMY     APPLICATION OF WOUND VAC  05/10/2021   Procedure: APPLICATION OF WOUND VAC;  Surgeon: Herbert Pun, MD;  Location: [facility];  Service: General;;   APPLICATION OF WOUND VAC Left 05/17/2021   Procedure: APPLICATION OF WOUND VAC/WOUND VAC EXCHANGE-Matrix Myriad;  Surgeon: Herbert Pun, MD;  Location: [facility];  Service: General;  Laterality: Left;   APPLICATION OF WOUND VAC  05/24/2021   Procedure: APPLICATION OF WOUND VAC;  Surgeon: Herbert Pun, MD;  Location: [facility];  Service: General;;   COLONOSCOPY  10/29/2006   Dr Allen Norris   COLONOSCOPY WITH PROPOFOL N/A 11/05/2016   Procedure: COLONOSCOPY WITH PROPOFOL;  Surgeon: Robert Bellow, MD;  Location: [facility];  Service: Endoscopy;  Laterality: N/A;   INCISION AND DRAINAGE OF WOUND Left 05/10/2021   Procedure: IRRIGATION AND DEBRIDEMENT LEFT LEG;  Surgeon: Herbert Pun, MD;  Location: [facility];  Service: General;  Laterality: Left;   INCISION AND DRAINAGE OF WOUND Left 05/24/2021   Procedure: IRRIGATION AND DEBRIDEMENT LEFT LEG;  Surgeon: Herbert Pun, MD;  Location: [facility];  Service: General;  Laterality: Left;   IVC FILTER INSERTION N/A 05/29/2021   Procedure: IVC FILTER INSERTION;  Surgeon: Katha Cabal, MD;  Location: [place] [facility];  Service: Cardiovascular;  Laterality: N/A;   MINOR GRAFT APPLICATION  1/61/0960   Procedure: Myriad Matrix  APPLICATION;  Surgeon: Herbert Pun, MD;  Location: [facility];  Service:  General;;   NASAL SINUS SURGERY  2002    Dr Carlis Abbott   PEG PLACEMENT N/A 05/28/2021   Procedure: PERCUTANEOUS ENDOSCOPIC GASTROSTOMY (PEG) PLACEMENT;  Surgeon: Benjamine Sprague, DO;  Location: [facility];  Service: General;  Laterality: N/A;  TRAVEL CASE   TRACHEOSTOMY TUBE PLACEMENT N/A 05/17/2021   Procedure: TRACHEOSTOMY;  Surgeon: Clyde Canterbury, MD;  Location: [facility];  Service: ENT;  Laterality: N/A;   WOUND DEBRIDEMENT Left 05/07/2021   Procedure: DEBRIDEMENT WOUND;  Surgeon: Herbert Pun, MD;  Location: [facility];  Service: General;  Laterality: Left;     Social History   reports that she quit smoking about 20 years ago. Her smoking use included cigarettes. She has a 80.00 pack-year smoking history. She quit smokeless tobacco use about 20 years ago.  Her smokeless tobacco use included snuff. She reports that she does not drink alcohol and does not use drugs.   Family History   Her family history includes Alcohol abuse in her brother; Breast cancer (age of onset: 60) in her sister; Congestive Heart Failure in her mother; Coronary artery disease (age of onset: 87) in her father; Heart disease in her brother; Varicose Veins in her brother.   Allergies No Known Allergies   Home Medications  Prior to Admission medications   Medication Sig Start Date End Date Taking? Authorizing Provider  albuterol (PROVENTIL) (2.5 MG/3ML) 0.083% nebulizer solution Take 3 mLs (2.5 mg total) by nebulization every 6 (six) hours as needed for wheezing or shortness of breath. 10/03/19   Steele Sizer, MD  aspirin 81 MG tablet Take 1 tablet (81 mg total) by mouth daily. 01/08/16   Steele Sizer, MD  atorvastatin (LIPITOR) 40 MG tablet Take 1 tablet (40 mg total) by mouth daily. 02/11/21   Steele Sizer, MD  azelastine (ASTELIN) 0.1 % nasal spray Place 2 sprays into both nostrils 2 (two) times daily as needed for rhinitis or allergies. 07/31/20   Delsa Grana, PA-C  baclofen (LIORESAL) 10 MG tablet Take 10 mg by mouth daily. 04/16/21   [provider]  calcium carbonate (OSCAL) 1500 (600 Ca) MG TABS tablet Take 600 mg of elemental calcium by mouth 2 (two) times daily with a meal.    [provider]  celecoxib (CELEBREX) 200 MG capsule Take 200 mg by mouth daily. 04/03/21   [provider]  Cetirizine HCl (ZYRTEC ALLERGY) 10 MG TBDP Take 10 mg by mouth at bedtime. For sinus symptoms and seasonal allergies 07/31/20   Delsa Grana, PA-C  docusate sodium (COLACE) 100 MG capsule Take 100 mg by mouth daily.    [provider]  fluticasone Asencion Islam) 50 MCG/ACT nasal spray  08/31/20   [provider]  Fluticasone-Umeclidin-Vilant (TRELEGY ELLIPTA) 100-62.5-25 MCG/INH AEPB Inhale 1 puff into the lungs daily. In place of Stiolto 02/10/20   Steele Sizer, MD  glucose blood (ACCU-CHEK AVIVA PLUS) test strip Use as instructed 12/08/17   Steele Sizer, MD  Lancets (ACCU-CHEK SOFT TOUCH) lancets Use as instructed 12/08/17   Steele Sizer, MD  loratadine (CLARITIN) 10 MG tablet Take 1 tablet (10 mg total) by mouth daily. 02/11/21   Steele Sizer, MD  meloxicam (MOBIC) 15 MG tablet Take 15 mg by mouth daily. 04/23/21   [provider]  metFORMIN (GLUCOPHAGE-XR) 750 MG 24 hr tablet Take 1 tablet (750 mg total) by mouth daily with breakfast. 02/11/21   Ancil Boozer, Drue Stager, MD  montelukast (SINGULAIR) 10 MG tablet Take 1 tablet (10 mg total) by mouth at bedtime. 02/11/21  Steele Sizer, MD  Multiple Vitamins-Minerals (MULTIVITAL PO) Take 1 tablet by mouth daily.    [provider]  omeprazole (PRILOSEC) 20 MG capsule Take 1 capsule (20 mg total) by mouth daily. 02/11/21   Steele Sizer, MD  Elizabeth 30 MG/2ML SOSY  10/09/20   [provider]  triamterene-hydrochlorothiazide (DYAZIDE) 37.5-25 MG capsule TAKE 1 CAPSULE EVERY DAY 05/02/21   Steele Sizer, MD  vitamin B-12 (CYANOCOBALAMIN) 100 MCG tablet Take 100 mcg by mouth daily.    [provider]  Scheduled Meds:   amiodarone  200 mg Per Tube BID   vitamin C  500 mg Per Tube BID   chlorhexidine gluconate (MEDLINE KIT)  15 mL Mouth Rinse BID   Chlorhexidine Gluconate Cloth  6 each Topical Q0600   cholecalciferol  2,000 Units Per Tube Daily   collagenase   Topical Daily   Darbepoetin Alfa  100 mcg Subcutaneous Q7 days   escitalopram  10 mg Per Tube Daily   free water  30 mL Per Tube Q4H   heparin sodium (porcine)       insulin aspart  0-20 Units Subcutaneous Q4H   insulin aspart  8 Units Subcutaneous Q4H   insulin detemir  23 Units Subcutaneous BID   ipratropium-albuterol  3 mL Nebulization TID   lactobacillus  1 g Per Tube TID WC   lip balm   Topical TID   magic mouthwash w/lidocaine  10 mL Oral QID   mouth rinse  15 mL Mouth Rinse 10 times per day   metoCLOPramide (REGLAN) injection  5 mg Intravenous Q8H   midodrine  10 mg Per Tube TID WC   multivitamin  1 tablet Per Tube QHS   nutrition supplement (JUVEN)  1 packet Per Tube BID BM   pantoprazole sodium  40 mg Per Tube Daily   sodium chloride flush  10-40 mL Intracatheter Q12H   valACYclovir  500 mg Per Tube Daily   vitamin A  10,000 Units Per Tube Daily   Continuous Infusions:  sodium chloride Stopped (06/03/21 0902)   sodium chloride     dextrose 20 mL/hr at 06/08/21 0600   erythromycin 100 mL/hr at 06/08/21 0600   feeding supplement (PIVOT 1.5 CAL) Stopped (06/07/21 1255)   norepinephrine (LEVOPHED) Adult infusion 4 mcg/min (06/08/21 1117)   PRN Meds:.sodium chloride, acetaminophen, albuterol, artificial tears, clonazePAM, HYDROmorphone (DILAUDID) injection, loperamide, ondansetron (ZOFRAN) IV, oxyCODONE, sennosides, sodium chloride flush  Assessment & Plan:   Ventilator Dependent Respiratory Failure s/p tracheostomy  ATELECTASIS, CHF PULMONARY EDEMA AND PLEURAL EFFUSIONS ALL PRESENT ON ADMISSION Hx: COPD, OSA, and Obesity - Trach collar trials as tolerated with mechanical ventilation qhs for now  - Wean Fio2 and PEEP as  tolerated - VAP/VENT bundle implementation - Wean PEEP & FiO2 as tolerated, maintain SpO2 >88% - VAP protocol in place - Plateau pressures less than 30 cm H20  - Intermittent chest x-ray & ABG PRN - Ensure adequate pulmonary hygiene   - Per speech therapy trach will need to be downsized to size 6 when pt able to tolerate to be considered for PMV therapy    Intermittent atrial fibrillation with rvr~RESOLVED  - Continuous cardiac monitoring - Continue scheduled midodrine   - Continue amiodarone   Left necrotizing fascitis (strep pyogenes)-present on admission s/p multiple debridements, application of wound VAC system, s/p myriad matrix placement 05/17/2021 Streptococcal toxic shock syndrome Pseudomonas in Tracheal wound culture (? respiratory secretions coating the tracheostomy) - Monitor fever curve - Trend WBC's  -  Completed full course of abx therapy~02/17  - General Surgery following, appreciate input ~ wound care as per surgery - Vascular Surgery consulted for possible left AKA awaiting pts family decision regarding this   Acute kidney injury secondary to ischemic nephropathy in the setting of hypotension complicated with sepsis  - Avoid nephrotoxic agents - Follow urine output, BMP - Ensure adequate renal perfusion, optimize oxygenation - Renal dose medications - Nephrology following, appreciate input~pt now tolerating HD    Shock liver   - Monitor hepatic function panel   - Monitor coags   Acute on chronic anemia~pt unable to tolerate chemical anticoagulation s/p IVC filter placement 05/29/2021 -Monitor for S/Sx of bleeding -Trend CBC -Unable to tolerate anticoagulants or SCD's for VTE Prophylaxis ~ IVC filter placed 2/15 -Transfuse for Hgb <7  Type II diabetes mellitus  -CBG's q4h; Target range of 140 to 180 -SSI -Follow ICU Hypo/Hyperglycemia protocol    Toxic metabolic encephalopathy~improving  Small SAH  Appears Depressed NO ANTICOAGULATION FOR  1 MONTH AND  THEN NEED TO RE-ASSESS NO ANTIPLATELET AGENTS FOR 1 MONTH AND THEN NEED TO RE-ASSESS CAN POSSIBLY CONSIDER BABY Alexandra BUT WOULD NEED TO PROCEED WITH CAUTION -Continue Lexapro -Provide supportive care -Promote normal sleep/wake cycle -Promote family presence -Avoid sedating meds as able  Best practice:  Diet: Tube Feeds  Pain/Anxiety/Delirium protocol (if indicated): scheduled and prn oxycodone  VAP protocol (if indicated): Ordered  DVT prophylaxis: Unable to tolerate chemical prophylaxis s/p IVC filter placement  GI prophylaxis: PPI Glucose control:  SSI Yes Central venous access: Left IJ Trialysis Catheter Arterial line:  N/A Foley:  N/A Mobility:  bed rest  PT consulted: N/A Last date of multidisciplinary goals of care discussion [06/08/21] Code Status:  full code Disposition: ICU   = Goals of Care = Code Status Order: ICU  Primary Emergency ContactHADASSA, CERMAK, Home Phone: [phone] Will update family once they arrive at bedside   Critical care time: 40 minutes    Darel Hong, AGACNP-BC [facility] Prefer epic messenger for cross cover needs If after hours, please call E-link

## 2021-06-08 NOTE — Progress Notes (Signed)
Hemodialysis notes 11:08 Pre HD bp was low at 86/35 patient is alert and no complaints. ICU nurse started Norepinephrine (levo) IV.  11:57 HD treatment started 12:00 BP is 104/44.  13:00 BP started dropping to 89/39 (54) bp cuff readjusted, repeat BP was 90/47 (61),  ICU  nurse made aware, she increased her dose of Norepinephrine.  13:15 bp was 87/40 patient is alert. UF was turned off, 100cc NSS flushed, UF net goal decreased to 1L from 1.5L repeat bp was 94/47 (62). DR Theador Hawthorne was notified. UF off maintained.  14:31bp was 78/32 flushed another 100cc NSS, ICU nurse aware, she increased again the dose of norepinephrine. Pt still have 28 minutes remaining. DR Theador Hawthorne was notified with order to continue and finish the treatment, repeat was 121/45. Pt is alert.  UF off maintained.  15:00 HD completed. Total treatment time 3 hours. Total UF net removed  33ml. Patient is alert.

## 2021-06-08 NOTE — Progress Notes (Signed)
[facility]  PROGRESS NOTE   Subjective:    Pt is unable to offer any complaints.  Patient family was present in the room Patient family had no new specific concerns  02/24 0701 - 02/25 0700 In: 1300.4 [I.V.:580; NG/GT:283; IV Piggyback:337.4] Out: 325 [Drains:250; Stool:75]    Objective:  Vital signs in last 24 hours:  Temp:  [98 F (36.7 C)-99.2 F (37.3 C)] 99.1 F (37.3 C) (02/25 0400) Pulse Rate:  [78-94] 94 (02/25 0600) Resp:  [17-23] 21 (02/25 0600) BP: (81-131)/(31-60) 118/60 (02/25 0600) SpO2:  [91 %-100 %] 95 % (02/25 0600) FiO2 (%):  [28 %] 28 % (02/25 0719) Weight:  [122.9 kg] 122.9 kg (02/25 0422)  Weight change: 5.2 kg Filed Weights   06/06/21 1449 06/07/21 0313 06/08/21 0422  Weight: 119.1 kg 116.6 kg 122.9 kg    Intake/Output: I/O last 3 completed shifts: In: 1585.2 [I.V.:788; Other:100; NG/GT:283; IV Piggyback:414.2] Out: 1000 [Urine:375; Drains:550; Stool:75]   Intake/Output this shift:  No intake/output data recorded.  Physical Exam: General:  Critically ill  Head:  /AT, OM moist  Eyes:  Anicteric  Neck:  tracheostomy in place  Lungs:  Scattered rhonchi, normal effort  Heart:  regular  Abdomen:   Soft, nontender  Extremities:  Left lower extremity wound vac 1+ right lower extremity edema  Neurologic:  Awake, alert  Skin:  No lesions  Access:  Left IJ temp HD catheter in situ    Basic Metabolic Panel: Recent Labs  Lab 06/03/21 0534 06/04/21 0438 06/05/21 0351 06/06/21 0521 06/07/21 0421 06/08/21 0430  NA 133* 132* 130* 131* 131* 130*  K 4.6 6.0* 4.4 5.1 4.5 5.2*  CL 101 98 94* 95* 91* 90*  CO2 25 21* _0 GLUCOSE 161* 189* 194* 158* 83 102*  BUN 77* 98* 97* 152* 80* 100*  CREATININE 1.83* 2.86* 2.11* 2.95* 2.12* 2.85*  CALCIUM 9.6 9.4 8.9 9.5 9.2 9.1  MG 2.0 2.2 2.0  --  2.0 2.2  PHOS 3.3 4.6 4.1 4.9* 5.3* 6.9*    CBC: Recent Labs  Lab 06/04/21 0438 06/05/21 0351 06/06/21 0521 06/06/21 1615  06/07/21 0421 06/08/21 0430  WBC 7.7 8.5 8.7  --  12.6* 12.3*  NEUTROABS 6.7 7.0 6.3  --  9.1* 8.9*  HGB 7.4* 7.3* 6.9* 8.7* 8.4* 7.8*  HCT 24.2* 23.2* 21.7* 27.5* 26.2* 24.2*  MCV 91.3 89.6 88.6  --  87.3 87.4  PLT 198 225 255  --  266 259     Urinalysis: No results for input(s): COLORURINE, LABSPEC, PHURINE, GLUCOSEU, HGBUR, BILIRUBINUR, KETONESUR, PROTEINUR, UROBILINOGEN, NITRITE, LEUKOCYTESUR in the last 72 hours.  Invalid input(s): APPERANCEUR    Imaging: DG Abd 1 View  Result Date: 06/07/2021 CLINICAL DATA:  Vomiting, pain, fevers, chills EXAM: ABDOMEN - 1 VIEW COMPARISON:  05/20/2021 FINDINGS: No bowel dilatation to suggest obstruction. No evidence of pneumoperitoneum, portal venous gas or pneumatosis. No pathologic calcifications along the expected course of the ureters. Right nephrolithiasis. IVC filter with the tip at the level of L2. No acute osseous abnormality. IMPRESSION: No evidence of bowel obstruction. Electronically Signed   By: Kathreen Devoid M.D.   On: 06/07/2021 10:34     Medications:    sodium chloride Stopped (06/03/21 0902)   sodium chloride     dextrose 20 mL/hr at 06/08/21 0600   erythromycin 100 mL/hr at 06/08/21 0600   feeding supplement (PIVOT 1.5 CAL) Stopped (06/07/21 1255)   norepinephrine (LEVOPHED) Adult infusion Stopped (06/07/21  0400)    amiodarone  200 mg Per Tube BID   vitamin C  500 mg Per Tube BID   chlorhexidine gluconate (MEDLINE KIT)  15 mL Mouth Rinse BID   Chlorhexidine Gluconate Cloth  6 each Topical Q0600   cholecalciferol  2,000 Units Per Tube Daily   collagenase   Topical Daily   Darbepoetin Alfa  100 mcg Subcutaneous Q7 days   escitalopram  10 mg Per Tube Daily   free water  30 mL Per Tube Q4H   insulin aspart  0-20 Units Subcutaneous Q4H   insulin aspart  8 Units Subcutaneous Q4H   insulin detemir  23 Units Subcutaneous BID   ipratropium-albuterol  3 mL Nebulization TID   lactobacillus  1 g Per Tube TID WC   lip balm    Topical TID   magic mouthwash w/lidocaine  10 mL Oral QID   mouth rinse  15 mL Mouth Rinse 10 times per day   metoCLOPramide (REGLAN) injection  5 mg Intravenous Q8H   midodrine  10 mg Per Tube TID WC   multivitamin  1 tablet Per Tube QHS   nutrition supplement (JUVEN)  1 packet Per Tube BID BM   pantoprazole sodium  40 mg Per Tube Daily   sodium chloride flush  10-40 mL Intracatheter Q12H   valACYclovir  500 mg Per Tube Daily   vitamin A  10,000 Units Per Tube Daily    Assessment/ Plan:     Principal Problem:   Severe sepsis with septic shock (CODE) ([place]) Active Problems:   COPD (chronic obstructive pulmonary disease) (HCC)   Morbid obesity ([place])   Diabetes mellitus type 2 in obese (HCC)   Atrial fibrillation and flutter (HCC)   Necrotizing fasciitis ([place])   Acute and chronic respiratory failure (acute-on-chronic) (HCC)   On mechanically assisted ventilation (HCC)   Shock liver   Anemia associated with acute blood loss   Metabolic acidosis   Encounter for continuous renal replacement therapy (CRRT) for acute renal failure (HCC)   Acute encephalopathy   Group A streptococcal infection   Streptococcal toxic shock syndrome ([place])   Status post tracheostomy ([place])   Subdural hemorrhage (HCC)   Left leg cellulitis   Abdominal distension  [name] is a 71 y.o. black female with COPD/asthma, diabetes mellitus type II, hypertension, obstructive sleep apnea, paroxysmal atrial fibrillation, GERD, who presents to [facility] on 05/04/2021 for AKI (acute kidney injury) ([place]) [N17.9] Left leg cellulitis [L03.116] Severe sepsis ([place]) [A41.9, R65.20] Severe sepsis with lactic acidosis ([place]) [A41.9, R65.20, E87.20] Found to have necrotizing fascitis requiring multiple debridements by Dr. Peyton Najjar: 1/24. 1/27, 2/3 and 2/10.    #1: Acute kidney injury: AKI is secondary to ischemic nephropathy secondary to hypotension complicated with sepsis. History of bland urine. Baseline creatinine  of 0.98, GFR > 60 on 02/11/21.  Patient was on CRRT for prolonged period of time.  Transitioned to hemodialysis 06/04/2021. -I did start patient on dialysis today Patient did require vasopressors today.    #2: Sepsis and hypotension: necrotizing fasciitis for left lower extremity status post multiple debridement.  Requiring vasopressors: norepinephrine. On stress dose steroids and midodrine -Patient is requiring pressors during dialysis today        -Continue to monitor blood pressure trend.  #3. Anemia with Acute kidney injury:  Lab Results  Component Value Date   HGB 7.8 (L) 06/08/2021    S/p multiple blood transfusions this admission Aranesp weekly SQ (mon) Continues to have blood  loss through the wound VAC.  Hemoglobin up to 8.4 post blood transfusion.  Continue to monitor CBC closely if she continues to lose blood through the wound VAC.   #4. Acute Respiratory Failure.  Continues to do well off the ventilator at the moment.     LOS: [place], [facility] 2/25/20238:47 AM

## 2021-06-08 NOTE — Consult Note (Signed)
PHARMACY CONSULT NOTE  Pharmacy Consult for Electrolyte Monitoring and Replacement   Recent Labs: Potassium (mmol/L)  Date Value  06/08/2021 5.2 (H)  08/11/2013 3.3 (L)   Magnesium (mg/dL)  Date Value  06/08/2021 2.2   Calcium (mg/dL)  Date Value  06/08/2021 9.1   Calcium, Total (mg/dL)  Date Value  08/11/2013 9.2   Albumin (g/dL)  Date Value  06/08/2021 2.4 (L)  08/11/2013 3.1 (L)   Phosphorus (mg/dL)  Date Value  06/08/2021 6.9 (H)   Sodium (mmol/L)  Date Value  06/08/2021 130 (L)  08/11/2013 138   Assessment: 72 yo female with a previous medical history of diabetes mellitus type 2, hypertension, obstructive sleep apnea on CPAP, asthma/COPD, GERD, history of COVID-19 pneumonia presenting with lower left extremity pain and chills. Patient found to have severe sepsis and strep pyogenes bacteremia on admission and is currently intubated and sedated. LLL necrotizing fascitis. [facility] has been consulted to monitor and replace electrolytes.   -CRRT stopped 2/6, re-started (2/8>> 2/20) -s/p trach 2/3, tolerating trach collar trials -HD 2/21, 2/23  Nutrition:  --Pivot per tube at 65 mL/hr --Free water per tube 30 mL q4h  Goal of Therapy:  Electrolytes within normal limits  Plan:  Mild stable hyponatremia, worsening hypochloremia. Fluid balance / adjustment as indicated per [facility] / nephrology Mild hyperphosphatemia, defer management to nephrology Hemodialysis planned for today 2/25 No electrolyte replacement warranted for today Renal function panel repeat in AM  Lorris Carducci A  06/08/21

## 2021-06-09 DIAGNOSIS — A419 Sepsis, unspecified organism: Secondary | ICD-10-CM | POA: Diagnosis not present

## 2021-06-09 LAB — RENAL FUNCTION PANEL
Albumin: 2.2 g/dL — ABNORMAL LOW (ref 3.5–5.0)
Anion gap: 10 (ref 5–15)
BUN: 70 mg/dL — ABNORMAL HIGH (ref 8–23)
CO2: 27 mmol/L (ref 22–32)
Calcium: 8.7 mg/dL — ABNORMAL LOW (ref 8.9–10.3)
Chloride: 94 mmol/L — ABNORMAL LOW (ref 98–111)
Creatinine, Ser: 2.06 mg/dL — ABNORMAL HIGH (ref 0.44–1.00)
GFR, Estimated: 25 mL/min — ABNORMAL LOW (ref >60)
Glucose, Bld: 107 mg/dL — ABNORMAL HIGH (ref 70–99)
Phosphorus: 4.6 mg/dL (ref 2.5–4.6)
Potassium: 3.8 mmol/L (ref 3.5–5.1)
Sodium: 131 mmol/L — ABNORMAL LOW (ref 135–145)

## 2021-06-09 LAB — CBC WITH DIFFERENTIAL/PLATELET
Abs Immature Granulocytes: 1.21 10*3/uL — ABNORMAL HIGH (ref 0.00–0.07)
Basophils Absolute: 0.1 10*3/uL (ref 0.0–0.1)
Basophils Relative: 1 %
Eosinophils Absolute: 0.3 10*3/uL (ref 0.0–0.5)
Eosinophils Relative: 2 %
HCT: 24.9 % — ABNORMAL LOW (ref 36.0–46.0)
Hemoglobin: 7.8 g/dL — ABNORMAL LOW (ref 12.0–15.0)
Immature Granulocytes: 8 %
Lymphocytes Relative: 9 %
Lymphs Abs: 1.3 10*3/uL (ref 0.7–4.0)
MCH: 27.8 pg (ref 26.0–34.0)
MCHC: 31.3 g/dL (ref 30.0–36.0)
MCV: 88.6 fL (ref 80.0–100.0)
Monocytes Absolute: 0.7 10*3/uL (ref 0.1–1.0)
Monocytes Relative: 5 %
Neutro Abs: 11 10*3/uL — ABNORMAL HIGH (ref 1.7–7.7)
Neutrophils Relative %: 75 %
Platelets: 282 10*3/uL (ref 150–400)
RBC: 2.81 MIL/uL — ABNORMAL LOW (ref 3.87–5.11)
RDW: 18.4 % — ABNORMAL HIGH (ref 11.5–15.5)
Smear Review: NORMAL
WBC: 14.6 10*3/uL — ABNORMAL HIGH (ref 4.0–10.5)
nRBC: 2.2 % — ABNORMAL HIGH (ref 0.0–0.2)

## 2021-06-09 LAB — GLUCOSE, CAPILLARY
Glucose-Capillary: 102 mg/dL — ABNORMAL HIGH (ref 70–99)
Glucose-Capillary: 114 mg/dL — ABNORMAL HIGH (ref 70–99)
Glucose-Capillary: 120 mg/dL — ABNORMAL HIGH (ref 70–99)
Glucose-Capillary: 131 mg/dL — ABNORMAL HIGH (ref 70–99)
Glucose-Capillary: 138 mg/dL — ABNORMAL HIGH (ref 70–99)
Glucose-Capillary: 141 mg/dL — ABNORMAL HIGH (ref 70–99)
Glucose-Capillary: 141 mg/dL — ABNORMAL HIGH (ref 70–99)

## 2021-06-09 LAB — MAGNESIUM: Magnesium: 2 mg/dL (ref 1.7–2.4)

## 2021-06-09 NOTE — Progress Notes (Signed)
NAME:  Alexandra Foster, MRN:  [id number], DOB:  Nov 27, 1950, LOS: 66 ADMISSION DATE:  05/04/2021, CONSULTATION DATE:  05/04/2021 REFERRING MD:  Duffy Bruce MD CHIEF COMPLAINT:  Left leg pain   HPI  71 y.o female with medical history significant of Asthma/COPD, T2DM, HTN, OSA on CPAP, GERD, COVID-19 pneumonia, and Bilateral lower extremity edema who presented to the ED with chief complaints of LLE pain and chills since Thursday.  Patient report onset of symptoms for the past several days with worsening pain, swelling, fevers and chills, poor po intake and difficulty walking since Thursday. Denies injury to extremity or hx of cellulitis.  ED Course: In the emergency department, the temperature was 37.7C, the heart rate 103 beats/minute, the blood pressure 75/64  mm Hg, the respiratory rate 27 breaths/minute, and the oxygen saturation 99% on RA. CT of left lower leg showed diffuse subcutaneous edema concerning for cellulitis with no fluid collection/abscess. Pertinent Labs in Red/Diagnostics Findings: Na+/ K+: 141/2.9 Glucose: 144 BUN/Cr.: 44/3.47 AST/ALT:67/48   WBC/ TMAX: 16.5/ afebrile PCT: pending Lactic acid: 3.8>6.0 COVID PCR: Negative  Patient given 30 cc/kg of fluids and started on broad-spectrum antibiotics for sepsis with septic shock. Patient remained hypotensive despite IVF boluses therefore was started on Levophed. PCCM consulted.  Past Medical History    Arthritis   Asthma   COPD (chronic obstructive pulmonary disease) ([place])   Diabetes mellitus without complication ([place])   Endometriosis   GERD (gastroesophageal reflux disease)   Hypertension   Obesity   Sleep apnea    CPAP   Significant Hospital Events   1/21: Admitted to the ICU with severe sepsis with shock secondary to cellulitis of LLE 1/23: off pressors +Afib with RVR 1/24: OR for fasciotomy 1/25: post op resp failure with severe septic shock 1/26: remains on pressors 1/27: returns to the OR today for  debridement, wound VAC 1/28: overnight with hemodynamic deterioration, required reintubation, currently on pressors 1/29: remains on 3 pressors, CRRT, wound VAC and minimal sedation.  Awakens to voice and touch.  Seen by vascular surgery and awaiting input from general surgery regarding amputation of left leg 2/1: remains on pressors, on CRRT, MRI/CT shows SAH, afib with RVR, ECHO pending, restarted AMIO at 60 mg/hr 2/3: remains on crrt, remains on vent, plan for [facility] and wound vac change in OR 2/3: S/P TRACH, omplantation of cellular matrix on left leg 2/4: REMAINS ON PRESSORS, REMAINS ON VENT, REMAINS ON CRRT 2/5: REMAINS ON VENT, OFF PRESSORS, REMAINS ON CRRT 2/6: Patient is improved she is able to follow communication verbally and move extermities. She is on 5L/min Trache collar. We have stopped levophed and working on reducing vasopressin. She remains on CRRT.  Insulin infusion is ongoing and plan to have weight based regimen initiated. Additionally she will have SLP for vocalization and PMV trial as well as ROM training with OT/PT.  Multiple specialists on case appreciate everyone involved.  2/7: patient is improved mildly , she's off CRRT, shes off insulin drip. She is mentating.  She was evaluated by [facility].  She was unable to vocalize with PMV.  She may need PEG tube for nourishment.  2/8: patient is overnight worsened with fever and increased levophed dose.  She is negative 1.7L.   She is on zosyn.  2/9: patient is improved this am. She is giving thumbs up during examination. 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 She seems volume depleted  as well so we will start CVP monitoring and give IV LR 1L bolus today to check for volume responsiveness. Albumin also while on CRRT. PT/OT starting today. Type and screen repeat due to plan for OR in am. On zosyn with ID following. Starting [place] heparin today.   2/10: patient is s/p repeat surgery on RLE. She is on levophed 31mg post surgery. She is not tachycardic and able to follow some verbal communication.  2/11: patient stable overnight. Reordering PT/OT today. Wbc count slightly trending up remains on abx probably post surgical.  Remains severely anemic on CRRT and vasopressor support with tracheostomy.  2/12: patient with severe anemia today.  Reviewed with renal team for blood transfusion today.  2/13: remains critically ill, on pressors and CRRT 2/13: trach changed for a #8 cuffed distal XLT without difficulty. Wound cleaner. PER ENT 2/14: remains on pressors, CRRT, VENT, plan for PEG and WOUND vac change at bedside; received 1 unit of PRBC's due to anemia  2/15: s/p IVC filter placement due to inability to tolerate anticoagulation  2/16: plan for Trach collar trials as tolerated.  Continues on CRRT. Pt refusing nursing care and to work with PT/OT, Palliative Care re-consulted 2/17: Pt seems depressed, refusing nursing care.  Consult Psychiatry. 2/18: Dialysis filter continues to clot requiring filter changes with inability to return blood back.  Hgb 6.4 pt received 1 unit pRBC's.  Increased amiodarone frequency from 200 mg daily to bid due to pt converting in and out of atrial fibrillation with rvr  2/19: CRRT discontinued due to frequent clotting of circuit resulting in acute blood loss anemia due to inability to return blood back to pt  2/20: Pt currently off of vasopressors following iv fluid resuscitation  2/21: TCT for 30 hrs 2/22 TCT for 48 hrs, tolerated HD without pressors 2/23: General surgery discussing left AKA with pts family~pt with major stenotic complications on the ligaments and tendons resulting in difficult case for skin grafting in the future.  Vascular consulted for possible left AKA 2/25: Remains off vent and tolerating TC.  Plan for HD today.  Process for LTAC placement in process.  Pt's family is deciding on whether to  pursue amputation vs. Negative pressure dressing and local care 2/26: Required pressors for dialysis yesterday and has remained on low-dose norepinephrine.  She does acknowledge examiner but does not answer any questions shakes head "no" to everything asked, remains off ventilator   Consults:  [facility]  Nephrology ID  Speech  ENT   Significant Diagnostic Tests:  1/21: Chest Xray> no active cardiopulmonary process 1/21: CT left lower leg>Diffuse subcutaneous edema may represent cellulitis. No drainable fluid collection/abscess 1/21: Ultrasound lower unilateral left> no DVT  Micro Data:  1/21: SARS-CoV-2 PCR>>negative 1/21: Influenza PCR>>negative 1/21: Blood culture x2>>group A strep (strep pyogenes) 1/21: Urine Culture>negative  1/21: MRSA PCR>>negative  1/24: Left leg wound culture>>negative  2/7: [facility] [place] would culture>>few pseudomonas aeruginosa and few candida albicans  2/8: MRSA PCR>> negative  Antimicrobials:  Vancomycin 1/21>>1/22 Cefepime 1/21>>01/22 Clindamycin 1/22>>1/25 Ceftriaxone 1/21 X1; restarted 1/31>>2/6 Penicillin G 1/22>>1/31 Linezolid 1/25>>1/31 Metronidazole 02/6 x1 dose Unasyn 2/7>>2/7 Zosyn 2/7>>2/17  Subjective:  -Required pressors yesterday for dialysis, has remained on pressors -Afebrile, remains on/tolerating Trach collar -She is alert, not overtly engaging with examiner -Had HD yesterday ~ Developed hypotension during HD requiring Levophed -Family to decide on whether to pursue amputation vs. Continued wound care  -Leukocytosis persistent 14.6 today  OBJECTIVE  Blood pressure (!) 115/54, pulse 87,  temperature 99.3 F (37.4 C), temperature source Axillary, resp. rate (!) 26, height '5\' 5"'  (1.651 m), weight 115.7 kg, SpO2 96 %.    FiO2 (%):  [28 %] 28 %   Intake/Output Summary (Last 24 hours) at 06/09/2021 0734 Last data filed at 06/09/2021 0600 Gross per 24 hour  Intake 1081.09 ml  Output 597 ml  Net 484.09 ml    Filed  Weights   06/08/21 1127 06/08/21 1537 06/09/21 0452  Weight: 115.7 kg 115.5 kg 115.7 kg     Physical Examination  General: acute on chronically ill appearing female, NAD resting in bed on trach collar HENT: supple, no JVD, size 8 XLT tracheostomy present dressing dry/intact  Lungs: diminished on the right, even, non labored, normal effort Cardiovascular: nsr, rrr, no M/R/G, 2+ radial/2+ distal pulses Abdomen: +BS x4, obese, soft, non tender, non distended  Extremities: LLE negative pressure dressing present clean dry and intact, LE wound as below Neuro: Awake, follows commands, generalized weakness (but no focal deficits) PERRL.  Affect is flat GU: deferred, anuric  Skin: see below      Labs   CBC: Recent Labs  Lab 06/05/21 0351 06/06/21 0521 06/06/21 1615 06/07/21 0421 06/08/21 0430 06/09/21 0455  WBC 8.5 8.7  --  12.6* 12.3* 14.6*  NEUTROABS 7.0 6.3  --  9.1* 8.9* 11.0*  HGB 7.3* 6.9* 8.7* 8.4* 7.8* 7.8*  HCT 23.2* 21.7* 27.5* 26.2* 24.2* 24.9*  MCV 89.6 88.6  --  87.3 87.4 88.6  PLT 225 255  --  266 259 282     Basic Metabolic Panel: Recent Labs  Lab 06/04/21 0438 06/05/21 0351 06/06/21 0521 06/07/21 0421 06/08/21 0430 06/09/21 0455  NA 132* 130* 131* 131* 130* 131*  K 6.0* 4.4 5.1 4.5 5.2* 3.8  CL 98 94* 95* 91* 90* 94*  CO2 21* '23 23 26 23 27  ' GLUCOSE 189* 194* 158* 83 102* 107*  BUN 98* 97* 152* 80* 100* 70*  CREATININE 2.86* 2.11* 2.95* 2.12* 2.85* 2.06*  CALCIUM 9.4 8.9 9.5 9.2 9.1 8.7*  MG 2.2 2.0  --  2.0 2.2 2.0  PHOS 4.6 4.1 4.9* 5.3* 6.9* 4.6    GFR: Estimated Creatinine Clearance: 31.4 mL/min (A) (by C-G formula based on SCr of 2.06 mg/dL (H)). Recent Labs  Lab 06/02/21 0815 06/02/21 1111 06/03/21 0534 06/06/21 0521 06/07/21 0421 06/08/21 0430 06/09/21 0455  WBC  --   --    < > 8.7 12.6* 12.3* 14.6*  LATICACIDVEN 3.9* 3.0*  --   --   --   --   --    < > = values in this interval not displayed.     Liver Function Tests: Recent  Labs  Lab 06/05/21 0351 06/06/21 0521 06/07/21 0421 06/08/21 0430 06/09/21 0455  AST  --   --  81*  --   --   ALT  --   --  118*  --   --   ALKPHOS  --   --  193*  --   --   BILITOT  --   --  1.6*  --   --   PROT  --   --  5.7*  --   --   ALBUMIN 3.1* 3.0* 2.7*   2.8* 2.4* 2.2*    Cardiac Enzymes: No results for input(s): CKTOTAL, CKMB, CKMBINDEX, TROPONINI in the last 168 hours.  HbA1C: Hgb A1c MFr Bld  Date/Time Value Ref Range Status  05/05/2021 04:53 AM 6.7 (H) 4.8 -  5.6 % Final    Comment:    (NOTE)         Prediabetes: 5.7 - 6.4         Diabetes: >6.4         Glycemic control for adults with diabetes: <7.0   02/11/2021 09:41 AM 6.3 (H) <5.7 % of total Hgb Final    Comment:    For someone without known diabetes, a hemoglobin  A1c value between 5.7% and 6.4% is consistent with prediabetes and should be confirmed with a  follow-up test. . For someone with known diabetes, a value <7% indicates that their diabetes is well controlled. A1c targets should be individualized based on duration of diabetes, age, comorbid conditions, and other considerations. . This assay result is consistent with an increased risk of diabetes. . Currently, no consensus exists regarding use of hemoglobin A1c for diagnosis of diabetes for children. .     CBG: Recent Labs  Lab 06/08/21 1107 06/08/21 1630 06/08/21 2023 06/09/21 0026 06/09/21 0451  GLUCAP 121* 143* 154* 141* 120*     Review of Systems:   Unable to assess due to tracheostomy, patient is resistant to engage.  Severe sepsis with streptococcal toxic shock  Allergies No Known Allergies    Scheduled Meds:  amiodarone  200 mg Per Tube BID   vitamin C  500 mg Per Tube BID   chlorhexidine gluconate (MEDLINE KIT)  15 mL Mouth Rinse BID   Chlorhexidine Gluconate Cloth  6 each Topical Q0600   cholecalciferol  2,000 Units Per Tube Daily   collagenase   Topical Daily   Darbepoetin Alfa  100 mcg Subcutaneous Q7 days    escitalopram  10 mg Per Tube Daily   free water  30 mL Per Tube Q4H   insulin aspart  0-20 Units Subcutaneous Q4H   insulin aspart  8 Units Subcutaneous Q4H   insulin detemir  23 Units Subcutaneous BID   ipratropium-albuterol  3 mL Nebulization TID   lactobacillus  1 g Per Tube TID WC   lip balm   Topical TID   magic mouthwash w/lidocaine  10 mL Oral QID   mouth rinse  15 mL Mouth Rinse 10 times per day   metoCLOPramide (REGLAN) injection  5 mg Intravenous Q8H   midodrine  10 mg Per Tube TID WC   multivitamin  1 tablet Per Tube QHS   nutrition supplement (JUVEN)  1 packet Per Tube BID BM   pantoprazole sodium  40 mg Per Tube Daily   sodium chloride flush  10-40 mL Intracatheter Q12H   valACYclovir  500 mg Per Tube Daily   vitamin A  10,000 Units Per Tube Daily   Continuous Infusions:  sodium chloride Stopped (06/03/21 0902)   sodium chloride     erythromycin 250 mg (06/09/21 0656)   feeding supplement (PIVOT 1.5 CAL) Stopped (06/07/21 1255)   norepinephrine (LEVOPHED) Adult infusion 3 mcg/min (06/09/21 0448)   PRN Meds:.sodium chloride, acetaminophen, albuterol, artificial tears, clonazePAM, HYDROmorphone (DILAUDID) injection, loperamide, ondansetron (ZOFRAN) IV, oxyCODONE, sennosides, sodium chloride flush  Assessment & Plan:   Ventilator Dependent Respiratory Failure s/p tracheostomy  Severe sepsis with streptococcal toxic shock was precipitating factor Weaned off of vent Hx: COPD, OSA, and Obesity - Continue trach collar - Intermittent chest x-ray & ABG PRN - Ensure adequate pulmonary hygiene   - Per speech therapy: downsize to size 6 when pt able to tolerate to be considered for PMV therapy   - Reculture sputum  Intermittent atrial fibrillation with rvr~RESOLVED  - Continuous cardiac monitoring - Continue scheduled midodrine   - Continue amiodarone   Left necrotizing fascitis (strep pyogenes)-present on admission s/p multiple debridements, application of wound VAC  system, s/p myriad matrix placement 05/17/2021 -myriad matrix failed Streptococcal toxic shock syndrome Pseudomonas in Tracheal wound culture 2/7  - Monitor fever curve - Trend WBC's, mild bump on leukocytosis today - Completed full course of abx therapy~02/17  - General Surgery following, appreciate input ~ wound care as per surgery - Vascular Surgery consulted for possible left AKA awaiting pts family decision regarding this  - Support left AKA - Leukocytosis may be related to ongoing issues with left lower extremity - Check sputum culture  Acute kidney injury secondary to ischemic nephropathy in the setting of hypotension complicated with sepsis  - Avoid nephrotoxic agents - Follow urine output, BMP - Ensure adequate renal perfusion, optimize oxygenation - Renal dose medications - Nephrology following, appreciate input~pt now tolerating HD albeit with pressor support   Shock liver with persistent transaminitis - Monitor hepatic function panel 2/27 - Monitor coags  - Avoid hepatotoxic drugs as able  Acute on chronic anemia~pt unable to tolerate chemical anticoagulation s/p IVC filter placement 05/29/2021 -Monitor for S/Sx of bleeding -Trend CBC -Unable to tolerate anticoagulants or SCD's for VTE Prophylaxis ~ IVC filter placed 2/15 -Transfuse for Hgb <7 - Hgb 7.8 today  Type II diabetes mellitus  -CBG's q4h; Target range of 140 to 180 -SSI -Follow ICU Hypo/Hyperglycemia protocol    Toxic metabolic encephalopathy~improving  Minor SAH  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  NO ANTICOAGULATION FOR  1 MONTH AND THEN NEED TO RE-ASSESS NO ANTIPLATELET AGENTS FOR 1 MONTH AND THEN NEED TO RE-ASSESS CAN POSSIBLY CONSIDER BABY ASA BUT WOULD NEED TO PROCEED WITH CAUTION -Continue Lexapro -Provide supportive care -Promote normal sleep/wake cycle -Promote family presence -Avoid sedating meds as able -Consider Ritalin (must "washout" Lexapro first)  Moderate  malnutrition Sarcopenic obesity Debility/deconditioning - On tube feeds - Albumin 2.8 - Unable to participate on PT/OT due to issues above - AKA would facilitate, recovery time and participation with PT   Best practice:  Diet: Tube Feeds  Pain/Anxiety/Delirium protocol (if indicated): scheduled and prn oxycodone  VAP protocol (if indicated): Ordered  DVT prophylaxis: Unable to tolerate chemical prophylaxis s/p IVC filter placement  GI prophylaxis: PPI Glucose control:  SSI Yes Central venous access: Left IJ Trialysis Catheter Arterial line:  N/A Foley:  N/A Mobility:  bed rest  PT consulted: N/A Last date of multidisciplinary goals of care discussion [06/08/21] Code Status:  full code Disposition: SDU   = Goals of Care = Code Status Order: FULL  Primary Emergency Contact: [place], Home Phone: [phone]   Level 3 F/U    C. Derrill Kay, MD Advanced Bronchoscopy PCCM [facility]    *This note was dictated using voice recognition software/Dragon.  Despite best efforts to proofread, errors can occur which can change the meaning. Any transcriptional errors that result from this process are unintentional and may not be fully corrected at the time of dictation.

## 2021-06-09 NOTE — Consult Note (Signed)
PHARMACY CONSULT NOTE  Pharmacy Consult for Electrolyte Monitoring and Replacement   Recent Labs: Potassium (mmol/L)  Date Value  06/09/2021 3.8  08/11/2013 3.3 (L)   Magnesium (mg/dL)  Date Value  06/09/2021 2.0   Calcium (mg/dL)  Date Value  06/09/2021 8.7 (L)   Calcium, Total (mg/dL)  Date Value  08/11/2013 9.2   Albumin (g/dL)  Date Value  06/09/2021 2.2 (L)  08/11/2013 3.1 (L)   Phosphorus (mg/dL)  Date Value  06/09/2021 4.6   Sodium (mmol/L)  Date Value  06/09/2021 131 (L)  08/11/2013 138   Assessment: 71 yo female with a previous medical history of diabetes mellitus type 2, hypertension, obstructive sleep apnea on CPAP, asthma/COPD, GERD, history of COVID-19 pneumonia presenting with lower left extremity pain and chills. Patient found to have severe sepsis and strep pyogenes bacteremia on admission and is currently intubated and sedated. LLL necrotizing fascitis. [facility] has been consulted to monitor and replace electrolytes.   -CRRT stopped 2/6, re-started (2/8>> 2/20) -s/p trach 2/3, tolerating trach collar trials -HD 2/21, 2/23, 2/25  Nutrition:  --Pivot per tube at 65 mL/hr --Free water per tube 30 mL q4h  Goal of Therapy:  Electrolytes within normal limits  Plan:  Mild stable hyponatremia, Fluid balance / adjustment as indicated per [facility] / nephrology F/u Hemodialysis plan No electrolyte replacement warranted for today Renal function panel, Mag ordered daily  Terrion Gencarelli A  06/09/21

## 2021-06-09 NOTE — Progress Notes (Signed)
[facility]  PROGRESS NOTE   Subjective:    Pt is unable to offer any complaints.  Patient family was present in the room Patient family had no new specific concerns  02/25 0701 - 02/26 0700 In: 1081.1 [I.V.:551.1; NG/GT:267.5; IV Piggyback:262.5] Out: 597 [Urine:150; Drains:350]    Objective:  Vital signs in last 24 hours:  Temp:  [98.9 F (37.2 C)-99.5 F (37.5 C)] 99.3 F (37.4 C) (02/26 0400) Pulse Rate:  [78-101] 93 (02/26 0900) Resp:  [18-26] 26 (02/26 0200) BP: (48-155)/(30-77) 110/51 (02/26 0900) SpO2:  [92 %-100 %] 94 % (02/26 0900) FiO2 (%):  [28 %] 28 % (02/26 0742) Weight:  [115.5 kg-115.7 kg] 115.7 kg (02/26 0452)  Weight change: -7.2 kg Filed Weights   06/08/21 1127 06/08/21 1537 06/09/21 0452  Weight: 115.7 kg 115.5 kg 115.7 kg    Intake/Output: I/O last 3 completed shifts: In: 1498.6 [I.V.:771.2; NG/GT:327.5; IV Piggyback:399.9] Out: 158 [Urine:150; Drains:400; Other:97; Stool:75]   Intake/Output this shift:  No intake/output data recorded.  Physical Exam: General:  Critically ill  Head:  [place]/AT, OM moist  Eyes:  Anicteric  Neck:  tracheostomy in place  Lungs:  Scattered rhonchi, normal effort  Heart:  regular  Abdomen:   Soft, nontender  Extremities:  Left lower extremity wound vac 1+ right lower extremity edema  Neurologic:  Awake, alert  Skin:  No lesions  Access:  Left IJ temp HD catheter in situ    Basic Metabolic Panel: Recent Labs  Lab 06/04/21 0438 06/05/21 0351 06/06/21 0521 06/07/21 0421 06/08/21 0430 06/09/21 0455  NA 132* 130* 131* 131* 130* 131*  K 6.0* 4.4 5.1 4.5 5.2* 3.8  CL 98 94* 95* 91* 90* 94*  CO2 21* '23 23 26 23 27  ' GLUCOSE 189* 194* 158* 83 102* 107*  BUN 98* 97* 152* 80* 100* 70*  CREATININE 2.86* 2.11* 2.95* 2.12* 2.85* 2.06*  CALCIUM 9.4 8.9 9.5 9.2 9.1 8.7*  MG 2.2 2.0  --  2.0 2.2 2.0  PHOS 4.6 4.1 4.9* 5.3* 6.9* 4.6    CBC: Recent Labs  Lab 06/05/21 0351 06/06/21 0521  06/06/21 1615 06/07/21 0421 06/08/21 0430 06/09/21 0455  WBC 8.5 8.7  --  12.6* 12.3* 14.6*  NEUTROABS 7.0 6.3  --  9.1* 8.9* 11.0*  HGB 7.3* 6.9* 8.7* 8.4* 7.8* 7.8*  HCT 23.2* 21.7* 27.5* 26.2* 24.2* 24.9*  MCV 89.6 88.6  --  87.3 87.4 88.6  PLT 225 255  --  266 259 282     Urinalysis: No results for input(s): COLORURINE, LABSPEC, PHURINE, GLUCOSEU, HGBUR, BILIRUBINUR, KETONESUR, PROTEINUR, UROBILINOGEN, NITRITE, LEUKOCYTESUR in the last 72 hours.  Invalid input(s): APPERANCEUR    Imaging: DG Abd 1 View  Result Date: 06/07/2021 CLINICAL DATA:  Vomiting, pain, fevers, chills EXAM: ABDOMEN - 1 VIEW COMPARISON:  05/20/2021 FINDINGS: No bowel dilatation to suggest obstruction. No evidence of pneumoperitoneum, portal venous gas or pneumatosis. No pathologic calcifications along the expected course of the ureters. Right nephrolithiasis. IVC filter with the tip at the level of L2. No acute osseous abnormality. IMPRESSION: No evidence of bowel obstruction. Electronically Signed   By: Kathreen Devoid M.D.   On: 06/07/2021 10:34     Medications:    sodium chloride Stopped (06/03/21 0902)   sodium chloride     feeding supplement (PIVOT 1.5 CAL) Stopped (06/07/21 1255)   norepinephrine (LEVOPHED) Adult infusion 3 mcg/min (06/09/21 0448)    amiodarone  200 mg Per Tube BID   vitamin  C  500 mg Per Tube BID   chlorhexidine gluconate (MEDLINE KIT)  15 mL Mouth Rinse BID   Chlorhexidine Gluconate Cloth  6 each Topical Q0600   cholecalciferol  2,000 Units Per Tube Daily   collagenase   Topical Daily   Darbepoetin Alfa  100 mcg Subcutaneous Q7 days   escitalopram  10 mg Per Tube Daily   free water  30 mL Per Tube Q4H   insulin aspart  0-20 Units Subcutaneous Q4H   insulin aspart  8 Units Subcutaneous Q4H   insulin detemir  23 Units Subcutaneous BID   ipratropium-albuterol  3 mL Nebulization TID   lactobacillus  1 g Per Tube TID WC   lip balm   Topical TID   magic mouthwash w/lidocaine  10  mL Oral QID   mouth rinse  15 mL Mouth Rinse 10 times per day   metoCLOPramide (REGLAN) injection  5 mg Intravenous Q8H   midodrine  10 mg Per Tube TID WC   multivitamin  1 tablet Per Tube QHS   nutrition supplement (JUVEN)  1 packet Per Tube BID BM   pantoprazole sodium  40 mg Per Tube Daily   sodium chloride flush  10-40 mL Intracatheter Q12H   valACYclovir  500 mg Per Tube Daily   vitamin A  10,000 Units Per Tube Daily    Assessment/ Plan:     Principal Problem:   Severe sepsis with septic shock (CODE) ([place]) Active Problems:   COPD (chronic obstructive pulmonary disease) (HCC)   Morbid obesity ([place])   Diabetes mellitus type 2 in obese (HCC)   Atrial fibrillation and flutter (HCC)   Necrotizing fasciitis ([place])   Acute and chronic respiratory failure (acute-on-chronic) (HCC)   On mechanically assisted ventilation (HCC)   Shock liver   Anemia associated with acute blood loss   Metabolic acidosis   Encounter for continuous renal replacement therapy (CRRT) for acute renal failure (HCC)   Acute encephalopathy   Group A streptococcal infection   Streptococcal toxic shock syndrome ([place])   Status post tracheostomy ([place])   Subdural hemorrhage (HCC)   Left leg cellulitis   Abdominal distension  [name] is a 71 y.o. black female with COPD/asthma, diabetes mellitus type II, hypertension, obstructive sleep apnea, paroxysmal atrial fibrillation, GERD, who presents to [facility] 05[facility]04[facility]2021 for AKI (acute kidney injury) ([place]) [N17.9] Left leg cellulitis [L03.116] Severe sepsis ([place]) [A41.9, R65.20] Severe sepsis with lactic acidosis ([place]) [A41.9, R65.20, E87.20] Found to have necrotizing fascitis requiring multiple debridements by Dr. Peyton Najjar: 1/24. 1/27, 2/3 and 2/10.    #1: Acute kidney injury: AKI is secondary to ischemic nephropathy secondary to hypotension complicated with sepsis. History of bland urine. Baseline creatinine of 0.98, GFR > 60 on 02/11/21.  Patient was on  CRRT for prolonged period of time.  Transitioned to hemodialysis 06/04/2021. Patient required vasopressor during dialysis yesterday No need for renal placement therapy today    #2: Sepsis and hypotension: necrotizing fasciitis for left lower extremity status post multiple debridement.  Requiring vasopressors: norepinephrine. On stress dose steroids and midodrine -  Patient remains hemodynamically fragile, requiring pressors during dialysis .       -Continue to monitor blood pressure trend.  #3. Anemia with Acute kidney injury:  Lab Results  Component Value Date   HGB 7.8 (L) 06/09/2021    S/p multiple blood transfusions this admission Aranesp weekly SQ (mon) Continues to have blood loss through the wound VAC.  Hemoglobin up to 8.4  post blood transfusion.  Continue to monitor CBC closely if she continues to lose blood through the wound VAC.   #4. Acute Respiratory Failure.  Continues to do well off the ventilator at the moment.     LOS: [place], [facility] 2/26/20239:37 AM

## 2021-06-10 ENCOUNTER — Inpatient Hospital Stay: Payer: Medicare HMO

## 2021-06-10 DIAGNOSIS — R6521 Severe sepsis with septic shock: Secondary | ICD-10-CM | POA: Diagnosis not present

## 2021-06-10 DIAGNOSIS — L03116 Cellulitis of left lower limb: Secondary | ICD-10-CM | POA: Diagnosis not present

## 2021-06-10 DIAGNOSIS — N179 Acute kidney failure, unspecified: Secondary | ICD-10-CM | POA: Diagnosis not present

## 2021-06-10 DIAGNOSIS — A419 Sepsis, unspecified organism: Secondary | ICD-10-CM | POA: Diagnosis not present

## 2021-06-10 LAB — CBC WITH DIFFERENTIAL/PLATELET
Abs Immature Granulocytes: 0.96 K/uL — ABNORMAL HIGH (ref 0.00–0.07)
Basophils Absolute: 0.1 K/uL (ref 0.0–0.1)
Basophils Relative: 1 %
Eosinophils Absolute: 0.5 K/uL (ref 0.0–0.5)
Eosinophils Relative: 3 %
HCT: 23.6 % — ABNORMAL LOW (ref 36.0–46.0)
Hemoglobin: 7.6 g/dL — ABNORMAL LOW (ref 12.0–15.0)
Immature Granulocytes: 6 %
Lymphocytes Relative: 8 %
Lymphs Abs: 1.2 K/uL (ref 0.7–4.0)
MCH: 28.5 pg (ref 26.0–34.0)
MCHC: 32.2 g/dL (ref 30.0–36.0)
MCV: 88.4 fL (ref 80.0–100.0)
Monocytes Absolute: 0.6 K/uL (ref 0.1–1.0)
Monocytes Relative: 4 %
Neutro Abs: 12.9 K/uL — ABNORMAL HIGH (ref 1.7–7.7)
Neutrophils Relative %: 78 %
Platelets: 290 K/uL (ref 150–400)
RBC: 2.67 MIL/uL — ABNORMAL LOW (ref 3.87–5.11)
RDW: 18.2 % — ABNORMAL HIGH (ref 11.5–15.5)
Smear Review: NORMAL
WBC: 16.3 K/uL — ABNORMAL HIGH (ref 4.0–10.5)
nRBC: 0.5 % — ABNORMAL HIGH (ref 0.0–0.2)

## 2021-06-10 LAB — GLUCOSE, CAPILLARY
Glucose-Capillary: 119 mg/dL — ABNORMAL HIGH (ref 70–99)
Glucose-Capillary: 99 mg/dL (ref 70–99)
Glucose-Capillary: 134 mg/dL — ABNORMAL HIGH (ref 70–99)
Glucose-Capillary: 146 mg/dL — ABNORMAL HIGH (ref 70–99)
Glucose-Capillary: 157 mg/dL — ABNORMAL HIGH (ref 70–99)
Glucose-Capillary: 155 mg/dL — ABNORMAL HIGH (ref 70–99)

## 2021-06-10 LAB — TYPE AND SCREEN
ABO/RH(D): O POS
Antibody Screen: NEGATIVE

## 2021-06-10 LAB — HEPATIC FUNCTION PANEL
ALT: 92 U/L — ABNORMAL HIGH (ref 0–44)
AST: 70 U/L — ABNORMAL HIGH (ref 15–41)
Albumin: 2.1 g/dL — ABNORMAL LOW (ref 3.5–5.0)
Alkaline Phosphatase: 258 U/L — ABNORMAL HIGH (ref 38–126)
Bilirubin, Direct: 0.6 mg/dL — ABNORMAL HIGH (ref 0.0–0.2)
Indirect Bilirubin: 0.6 mg/dL (ref 0.3–0.9)
Total Bilirubin: 1.2 mg/dL (ref 0.3–1.2)
Total Protein: 5.5 g/dL — ABNORMAL LOW (ref 6.5–8.1)

## 2021-06-10 LAB — RENAL FUNCTION PANEL
Albumin: 2.1 g/dL — ABNORMAL LOW (ref 3.5–5.0)
Anion gap: 16 — ABNORMAL HIGH (ref 5–15)
BUN: 97 mg/dL — ABNORMAL HIGH (ref 8–23)
CO2: 24 mmol/L (ref 22–32)
Calcium: 8.9 mg/dL (ref 8.9–10.3)
Chloride: 92 mmol/L — ABNORMAL LOW (ref 98–111)
Creatinine, Ser: 2.81 mg/dL — ABNORMAL HIGH (ref 0.44–1.00)
GFR, Estimated: 17 mL/min — ABNORMAL LOW (ref >60)
Glucose, Bld: 113 mg/dL — ABNORMAL HIGH (ref 70–99)
Phosphorus: 4.5 mg/dL (ref 2.5–4.6)
Potassium: 4.1 mmol/L (ref 3.5–5.1)
Sodium: 132 mmol/L — ABNORMAL LOW (ref 135–145)

## 2021-06-10 LAB — MAGNESIUM: Magnesium: 2.4 mg/dL (ref 1.7–2.4)

## 2021-06-10 LAB — VITAMIN E
Vitamin E (Alpha Tocopherol): 7.2 mg/L — ABNORMAL LOW (ref 9.0–29.0)
Vitamin E(Gamma Tocopherol): 0.2 mg/L — ABNORMAL LOW (ref 0.5–4.9)

## 2021-06-10 LAB — VITAMIN A: Vitamin A (Retinoic Acid): 56.5 ug/dL (ref 22.0–69.5)

## 2021-06-10 MED ORDER — MIDODRINE HCL 5 MG PO TABS
15.0000 mg | ORAL_TABLET | Freq: Three times a day (TID) | ORAL | Status: DC
  Administered 2021-06-10 – 2021-06-13 (×10): 15 mg
  Filled 2021-06-10 (×9): qty 3

## 2021-06-10 NOTE — Consult Note (Signed)
PHARMACY CONSULT NOTE  Pharmacy Consult for Electrolyte Monitoring and Replacement   Recent Labs: Potassium (mmol/L)  Date Value  06/10/2021 4.1  08/11/2013 3.3 (L)   Magnesium (mg/dL)  Date Value  06/10/2021 2.4   Calcium (mg/dL)  Date Value  06/10/2021 8.9   Calcium, Total (mg/dL)  Date Value  08/11/2013 9.2   Albumin (g/dL)  Date Value  06/10/2021 2.1 (L)  06/10/2021 2.1 (L)  08/11/2013 3.1 (L)   Phosphorus (mg/dL)  Date Value  06/10/2021 PENDING   Sodium (mmol/L)  Date Value  06/10/2021 132 (L)  08/11/2013 138   Assessment: 71 yo female with a previous medical history of diabetes mellitus type 2, hypertension, obstructive sleep apnea on CPAP, asthma/COPD, GERD, history of COVID-19 pneumonia presenting with lower left extremity pain and chills. Patient found to have severe sepsis and strep pyogenes bacteremia on admission and is currently intubated and sedated. LLL necrotizing fascitis. [facility] has been consulted to monitor and replace electrolytes.   -CRRT stopped 2/6, re-started (2/8>> 2/20) -s/p trach 2/3, tolerating trach collar trials --receiving iHD nephrology  Nutrition:  --Pivot per tube at 65 mL/hr --Free water per tube 30 mL q4h  Goal of Therapy:  Electrolytes within normal limits  Plan:  Mild stable hyponatremia, Fluid balance / adjustment as indicated per [facility] / nephrology F/u Hemodialysis plan No electrolyte replacement warranted for today Renal function panel, Mag ordered daily  Dallie Piles  06/10/21

## 2021-06-10 NOTE — Progress Notes (Signed)
Patient ID: Alexandra Foster, female   DOB: 10-Mar-1951, 71 y.o.   MRN: [id number]     [place] [facility] Day(s): 37.   Interval History: Patient seen and examined, no acute events or new complaints overnight.   Vital signs in last 24 hours: [min-max] current  Temp:  [99.2 F (37.3 C)-99.5 F (37.5 C)] 99.2 F (37.3 C) (02/27 0752) Pulse Rate:  [79-95] 84 (02/27 1145) Resp:  [19-30] 22 (02/27 1145) BP: (81-146)/(40-71) 113/50 (02/27 1145) SpO2:  [93 %-100 %] 93 % (02/27 1145) FiO2 (%):  [28 %] 28 % (02/27 0830) Weight:  [111.8 kg] 111.8 kg (02/27 0500)     Height: '5\' 5"'  (165.1 cm) Weight: 111.8 kg BMI (Calculated): 41.02   Physical Exam:  Constitutional: alert, cooperative and no distress  Extremity: Left leg with adequate translation tissue on muscular tissue.  No granulation tissue over the tendons.       Labs:  CBC Latest Ref Rng & Units 06/10/2021 06/09/2021 06/08/2021  WBC 4.0 - 10.5 K/uL 16.3(H) 14.6(H) 12.3(H)  Hemoglobin 12.0 - 15.0 g/dL 7.6(L) 7.8(L) 7.8(L)  Hematocrit 36.0 - 46.0 % 23.6(L) 24.9(L) 24.2(L)  Platelets 150 - 400 K/uL 290 282 259   CMP Latest Ref Rng & Units 06/10/2021 06/09/2021 06/08/2021  Glucose 70 - 99 mg/dL 113(H) 107(H) 102(H)  BUN 8 - 23 mg/dL PENDING 70(H) 100(H)  Creatinine 0.44 - 1.00 mg/dL 2.81(H) 2.06(H) 2.85(H)  Sodium 135 - 145 mmol/L 132(L) 131(L) 130(L)  Potassium 3.5 - 5.1 mmol/L 4.1 3.8 5.2(H)  Chloride 98 - 111 mmol/L 92(L) 94(L) 90(L)  CO2 22 - 32 mmol/L '24 27 23  ' Calcium 8.9 - 10.3 mg/dL 8.9 8.7(L) 9.1  Total Protein 6.5 - 8.1 g/dL 5.5(L) - -  Total Bilirubin 0.3 - 1.2 mg/dL 1.2 - -  Alkaline Phos 38 - 126 U/L 258(H) - -  AST 15 - 41 U/L 70(H) - -  ALT 0 - 44 U/L 92(H) - -    Imaging studies: No new pertinent imaging studies   Assessment/Plan:  71 y.o. female with left leg necrotizing fasciitis with 30 Day Post-Op s/p debridement, complicated by pertinent comorbidities including strep pyogenes bacteremia now  in septic shock (resolved), respiratory failure mechanical ventilation (resolved), liver failure (resolved), acute renal failure on CRRT, COPD, type 2 diabetes mellitus, hypertension, obstructive sleep apnea, GERD.   Left leg necrotizing fasciitis -S/p debridement 05/07/2021 -Second debridement on 05/10/2021.  Application of wound VAC system -S/p myriad matrix placement on 05/17/2021 -The Myriad matrix did not incorporated. Most of the matrix removed today.  -Today I remove the negative pressure dressing and apply a new wound VAC (DME). -I again discussed with daughter about plan of wound care.  At this moment there is still thinking about continue with negative pressure dressing and local cares versus the amputation.  They are thinking of making the final decision once he gets to the LTAC. -I will continue with negative pressure changes.  Next change on Friday  -At this moment the problem with healing is that there is no gross or tendons.  Only alternative for diet will be referral to plastic surgery for multiple surgical procedures.  At this moment family agreed that this is not the best alternative for her quality of life.  Arnold Long, MD

## 2021-06-10 NOTE — Progress Notes (Signed)
Nutrition Follow Up Note  ° °DOCUMENTATION CODES:  ° °Morbid obesity ° °INTERVENTION:  ° °Increase Pivot 1.5 to goal rate of 65ml/hr ° °Free water flushes 30ml q4 hours to maintain tube patency  ° °Regimen provides 2340kcal/day, 146g/day protein and 1364ml/day of free water  ° °Continue Juven Fruit Punch BID via tube, each serving provides 95kcal and 2.5g of protein (amino acids glutamine and arginine) ° °Continue Rena-vit daily via tube  ° °Continue Vitamin C 500mg BID via tube  ° °Continue Vitamin A 10,000 units daily via tube  ° °Continue Cholecalciferol 2000 units daily via tube  ° °Copper and vitamin A labs pending  ° °NUTRITION DIAGNOSIS:  ° °Increased nutrient needs related to wound healing as evidenced by estimated needs. ° °GOAL:  ° °Provide needs based on ASPEN/SCCM guidelines °-met with tube feeds  ° °MONITOR:  ° °Labs, Weight trends, TF tolerance, Skin, I & O's, Vent status ° °ASSESSMENT:  ° °71 y.o. female with a history of asthma/COPD, HTN, DMII, obesity, OSA on CPAP, GERD and COVID-19 pneumonia who was admitted 1/22 w/ left LE cellulitis, septic shock, S pyogenes bacteremia, hypotension, new Afib and AKI. ° °Pt s/p I & D 1/24 °Pt s/p I & D with veraflow placement 1/27 °Pt s/p Left IJ catheter and CRRT 1/28 °Pt s/p CT head 1/31; noted to have small SAH  °Pt s/p tracheostomy and cellular matrix placement 2/3  °Pt s/p surgically placed PEG tube 2/14 °Pt s/p IVC filter 2/15 °Pt transitioned off CRRT and initiated HD 2/21 ° °Pt remains on tach collar. PEG tube in place. No more vomiting since Friday. Pt continues on reglan; erythromycin discontinued. Tube feeds running at 50ml/hr; will increase to goal rate today. Fiber discontinued. Probiotics added; pt continues to have diarrhea but with less volume. Pt more alert today and waves at RD when entering the room. Pt continued on scheduled HD; pt requires pressors during HD at times. Pressors off today. Per chart, pt remains weight stable. Pt +8.1L on her I &  Os. Plan is for LTACH at discharge. Family still deciding about AKA.   ° °Medications reviewed and include: vitamin C, D3, insulin, lactobacillus, reglan, rena-vit, juven, protonix, vitamin A ° °Labs reviewed: Na 132(L), K 4.1 wnl, creat 2.81(H), Mg 2.4 wnl °WBC- 16.3(H), Hgb 7.6(L), Hct 23.6(L) °Cbgs- 134, 99, 119 x 24hrs ° °MAP- >65mmHg  ° °Drains- 300ml  ° °UOP- 450ml  ° °Diet Order:   °Diet Order   ° ° None  ° °  ° °EDUCATION NEEDS:  ° °Education needs have been addressed ° °Skin: WOC assessment reviewed: 40 x 35 cm left leg wound with VAC, lower lip .5X.5cm, upper lip 1X1.5 cm, middle back 1.5X1.5X.1cm, right posterior flank 3X10X.2cm, right upper buttock Stage 2 pressure injury; 1X1X.1cm, inner gluteal fold  1X.1X.1cm, inner buttocks1X1X.1cm. ° °Last BM:  2/27- 50ml via ostomy ° °Height:  ° °Ht Readings from Last 1 Encounters:  °05/31/21 5' 5" (1.651 m)  ° ° °Weight:  ° °Wt Readings from Last 1 Encounters:  °06/10/21 111.8 kg  ° ° °Ideal Body Weight:  56.8 kg ° °BMI:  Body mass index is 41.02 kg/m². ° °Estimated Nutritional Needs:  ° °Kcal:  2200-2500kcal/day ° °Protein:  120-140 grams ° °Fluid:  1.7-2.0L/day ° °  MS, RD, LDN °Please refer to AM[facility] for RD and/or RD on-call/weekend/after hours pager ° °

## 2021-06-10 NOTE — Progress Notes (Signed)
NAME:  Alexandra Foster, MRN:  [id number], DOB:  27-Nov-1950, LOS: 1 ADMISSION DATE:  05/04/2021, CONSULTATION DATE:  05/04/2021 REFERRING MD:  Duffy Bruce MD CHIEF COMPLAINT:  Left leg pain   HPI  71 y.o female with medical history significant of Asthma/COPD, T2DM, HTN, OSA on CPAP, GERD, COVID-19 pneumonia, and Bilateral lower extremity edema who presented to the ED with chief complaints of LLE pain and chills  Patient report onset of symptoms for the past several days with worsening pain, swelling, fevers and chills, poor po intake and difficulty walking Denies injury to extremity or hx of cellulitis.  ED Course: In the emergency department, the temperature was 37.7C, the heart rate 103 beats/minute, the blood pressure 75/64  mm Hg, the respiratory rate 27 breaths/minute, and the oxygen saturation 99% on RA. CT of left lower leg showed diffuse subcutaneous edema concerning for cellulitis with no fluid collection/abscess. Pertinent Labs in Red/Diagnostics Findings:  Past Medical History    Arthritis   Asthma   COPD (chronic obstructive pulmonary disease) ([place])   Diabetes mellitus without complication ([place])   Endometriosis   GERD (gastroesophageal reflux disease)   Hypertension   Obesity   Sleep apnea    CPAP   Significant Hospital Events   1/21: Admitted to the ICU with severe sepsis with shock secondary to cellulitis of LLE 1/23: off pressors +Afib with RVR 1/24: OR for fasciotomy 1/25: post op resp failure with severe septic shock 1/26: remains on pressors 1/27: returns to the OR today for debridement, wound VAC 1/28: overnight with hemodynamic deterioration, required reintubation, currently on pressors 1/29: remains on 3 pressors, CRRT, wound VAC and minimal sedation.  Awakens to voice and touch.  Seen by vascular surgery and awaiting input from general surgery regarding amputation of left leg 2/1: remains on pressors, on CRRT, MRI/CT shows SAH, afib with RVR, ECHO  pending, restarted AMIO at 60 mg/hr 2/3: remains on crrt, remains on vent, plan for [facility] and wound vac change in OR 2/3: S/P TRACH, omplantation of cellular matrix on left leg 2/4: REMAINS ON PRESSORS, REMAINS ON VENT, REMAINS ON CRRT 2/5: REMAINS ON VENT, OFF PRESSORS, REMAINS ON CRRT 2/6: Patient is improved she is able to follow communication verbally and move extermities. She is on 5L/min Trache collar. We have stopped levophed and working on reducing vasopressin. She remains on CRRT.  Insulin infusion is ongoing and plan to have weight based regimen initiated. Additionally she will have SLP for vocalization and PMV trial as well as ROM training with OT/PT.  Multiple specialists on case appreciate everyone involved.  2/7: patient is improved mildly , she's off CRRT, shes off insulin drip. She is mentating.  She was evaluated by [facility].  She was unable to vocalize with PMV.  She may need PEG tube for nourishment.  2/8: patient is overnight worsened with fever and increased levophed dose.  She is negative 1.7L.   She is on zosyn.  2/9: patient is improved this am. She is giving thumbs up during examination. 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 She seems volume depleted as well so we will start CVP monitoring and give IV LR 1L bolus today to check for volume responsiveness. Albumin also while on CRRT. PT/OT starting today. Type and screen repeat due to plan for OR in am. On zosyn with ID following. Starting [place] heparin today.  2/10: patient is s/p repeat surgery on  RLE. She is on levophed 54mg post surgery. She is not tachycardic and able to follow some verbal communication.  2/11: patient stable overnight. Reordering PT/OT today. Wbc count slightly trending up remains on abx probably post surgical.  Remains severely anemic on CRRT and vasopressor support with tracheostomy.  2/12: patient with severe  anemia today.  Reviewed with renal team for blood transfusion today.  2/13: remains critically ill, on pressors and CRRT 2/13: trach changed for a #8 cuffed distal XLT without difficulty. Wound cleaner. PER ENT 2/14: remains on pressors, CRRT, VENT, plan for PEG and WOUND vac change at bedside; received 1 unit of PRBC's due to anemia  2/15: s/p IVC filter placement due to inability to tolerate anticoagulation  2/16: plan for Trach collar trials as tolerated.  Continues on CRRT. Pt refusing nursing care and to work with PT/OT, Palliative Care re-consulted 2/17: Pt seems depressed, refusing nursing care.  Consult Psychiatry. 2/18: Dialysis filter continues to clot requiring filter changes with inability to return blood back.  Hgb 6.4 pt received 1 unit pRBC's.  Increased amiodarone frequency from 200 mg daily to bid due to pt converting in and out of atrial fibrillation with rvr  2/19: CRRT discontinued due to frequent clotting of circuit resulting in acute blood loss anemia due to inability to return blood back to pt  2/20: Pt currently off of vasopressors following iv fluid resuscitation  2/21: TCT for 30 hrs 2/22 TCT for 48 hrs, tolerated HD without pressors 2/23: General surgery discussing left AKA with pts family~pt with major stenotic complications on the ligaments and tendons resulting in difficult case for skin grafting in the future.  Vascular consulted for possible left AKA 2/25: Remains off vent and tolerating TC.  Plan for HD today.  Process for LTAC placement in process.  Pt's family is deciding on whether to pursue amputation vs. Negative pressure dressing and local care 2/26: Required pressors for dialysis yesterday and has remained on low-dose norepinephrine.  She does acknowledge examiner but does not answer any questions shakes head "no" to everything asked, remains off ventilator 2/27 severe shock-refractory   Consults:  PCCM General Surgery  Nephrology ID  Speech  ENT    Significant Diagnostic Tests:  1/21: Chest Xray> no active cardiopulmonary process 1/21: CT left lower leg>Diffuse subcutaneous edema may represent cellulitis. No drainable fluid collection/abscess 1/21: Ultrasound lower unilateral left> no DVT  Micro Data:  1/21: SARS-CoV-2 PCR>>negative 1/21: Influenza PCR>>negative 1/21: Blood culture x2>>group A strep (strep pyogenes) 1/21: Urine Culture>negative  1/21: MRSA PCR>>negative  1/24: Left leg wound culture>>negative  2/7: [place]urline [place] would culture>>few pseudomonas aeruginosa and few candida albicans  2/8: MRSA PCR>> negative  Antimicrobials:  Vancomycin 1/21>>1/22 Cefepime 1/21>>01/22 Clindamycin 1/22>>1/25 Ceftriaxone 1/21 X1; restarted 1/31>>2/6 Penicillin G 1/22>>1/31 Linezolid 1/25>>1/31 Metronidazole 02/6 x1 dose Unasyn 2/7>>2/7 Zosyn 2/7>>2/17  Subjective:  Critically ill  On pressors Off vent -Afebrile, remains on/tolerating Trach collar -She is alert, not overtly engaging with examiner -Had HD yesterday ~ Developed hypotension during HD requiring Levophed -Family to decide on whether to pursue amputation vs. Continued wound care    OBJECTIVE  Blood pressure (!) 112/42, pulse 79, temperature 99.4 F (37.4 C), temperature source Axillary, resp. rate (!) 21, height '5\' 5"'  (1.651 m), weight 111.8 kg, SpO2 99 %.    FiO2 (%):  [28 %] 28 %   Intake/Output Summary (Last 24 hours) at 06/10/2021 0719 Last data filed at 06/10/2021 0600 Gross per 24 hour  Intake 1333.76 ml  Output 800 ml  Net 533.76 ml    Filed Weights   06/08/21 1537 06/09/21 0452 06/10/21 0500  Weight: 115.5 kg 115.7 kg 111.8 kg   REVIEW OF SYSTEMS  PATIENT IS UNABLE TO PROVIDE COMPLETE REVIEW OF SYSTEMS DUE TO SEVERE depressions, disengaged, on pressors Severe sepsis with streptococcal toxic shock   PHYSICAL EXAMINATION:  GENERAL:critically ill appearing, s/p trach EYES: Pupils equal, round, reactive to light.  No scleral icterus.  MOUTH:  oral lesions improved on valtrex NECK: Supple.  PULMONARY: +rhonchi, +wheezing CARDIOVASCULAR: S1 and S2.  No murmurs  GASTROINTESTINAL: Soft, nontender, -distended. Positive bowel sounds.  MUSCULOSKELETAL: No swelling, clubbing, or edema.  NEUROLOGIC: alert, no focal defecits      Labs   CBC: Recent Labs  Lab 06/06/21 0521 06/06/21 1615 06/07/21 0421 06/08/21 0430 06/09/21 0455 06/10/21 0508  WBC 8.7  --  12.6* 12.3* 14.6* 16.3*  NEUTROABS 6.3  --  9.1* 8.9* 11.0* 12.9*  HGB 6.9* 8.7* 8.4* 7.8* 7.8* 7.6*  HCT 21.7* 27.5* 26.2* 24.2* 24.9* 23.6*  MCV 88.6  --  87.3 87.4 88.6 88.4  PLT 255  --  266 259 282 290     Basic Metabolic Panel: Recent Labs  Lab 06/05/21 0351 06/06/21 0521 06/07/21 0421 06/08/21 0430 06/09/21 0455 06/10/21 0508  NA 130* 131* 131* 130* 131*  --   K 4.4 5.1 4.5 5.2* 3.8  --   CL 94* 95* 91* 90* 94*  --   CO2 '23 23 26 23 27  ' --   GLUCOSE 194* 158* 83 102* 107*  --   BUN 97* 152* 80* 100* 70*  --   CREATININE 2.11* 2.95* 2.12* 2.85* 2.06*  --   CALCIUM 8.9 9.5 9.2 9.1 8.7*  --   MG 2.0  --  2.0 2.2 2.0 2.4  PHOS 4.1 4.9* 5.3* 6.9* 4.6  --     GFR: Estimated Creatinine Clearance: 30.7 mL/min (A) (by C-G formula based on SCr of 2.06 mg/dL (H)). Recent Labs  Lab 06/07/21 0421 06/08/21 0430 06/09/21 0455 06/10/21 0508  WBC 12.6* 12.3* 14.6* 16.3*     Liver Function Tests: Recent Labs  Lab 06/06/21 0521 06/07/21 0421 06/08/21 0430 06/09/21 0455 06/10/21 0508  AST  --  81*  --   --  70*  ALT  --  118*  --   --  92*  ALKPHOS  --  193*  --   --  258*  BILITOT  --  1.6*  --   --  1.2  PROT  --  5.7*  --   --  5.5*  ALBUMIN 3.0* 2.7*   2.8* 2.4* 2.2* 2.1*      CBG: Recent Labs  Lab 06/09/21 1118 06/09/21 1711 06/09/21 1914 06/09/21 2328 06/10/21 0400  GLUCAP 131* 141* 138* 114* 119*     Allergies No Known Allergies    Scheduled Meds:  amiodarone  200 mg Per Tube BID   vitamin C  500 mg Per Tube BID    chlorhexidine gluconate (MEDLINE KIT)  15 mL Mouth Rinse BID   Chlorhexidine Gluconate Cloth  6 each Topical Q0600   cholecalciferol  2,000 Units Per Tube Daily   collagenase   Topical Daily   Darbepoetin Alfa  100 mcg Subcutaneous Q7 days   escitalopram  10 mg Per Tube Daily   free water  30 mL Per Tube Q4H   insulin aspart  0-20 Units Subcutaneous Q4H   insulin aspart  8 Units Subcutaneous  Q4H   insulin detemir  23 Units Subcutaneous BID   ipratropium-albuterol  3 mL Nebulization TID   lactobacillus  1 g Per Tube TID WC   lip balm   Topical TID   magic mouthwash w/lidocaine  10 mL Oral QID   mouth rinse  15 mL Mouth Rinse 10 times per day   metoCLOPramide (REGLAN) injection  5 mg Intravenous Q8H   midodrine  10 mg Per Tube TID WC   multivitamin  1 tablet Per Tube QHS   nutrition supplement (JUVEN)  1 packet Per Tube BID BM   pantoprazole sodium  40 mg Per Tube Daily   sodium chloride flush  10-40 mL Intracatheter Q12H   valACYclovir  500 mg Per Tube Daily   vitamin A  10,000 Units Per Tube Daily   Continuous Infusions:  sodium chloride Stopped (06/03/21 0902)   sodium chloride     feeding supplement (PIVOT 1.5 CAL) 50 mL/hr at 06/10/21 0600   norepinephrine (LEVOPHED) Adult infusion 2 mcg/min (06/09/21 2200)   PRN Meds:.sodium chloride, acetaminophen, albuterol, artificial tears, clonazePAM, HYDROmorphone (DILAUDID) injection, loperamide, ondansetron (ZOFRAN) IV, oxyCODONE, sennosides, sodium chloride flush  Assessment & Plan:   Admitted for severe septic shock with  streptococcal toxic shock LLE NECROTIZING fascitis complicated by acute renal failure and hypoixc resp failure   Severe ACUTE Hypoxic and Hypercapnic Respiratory Failure Ventilator Dependent Respiratory Failure s/p tracheostomy  Severe sepsis with streptococcal toxic shock was precipitating factor Weaned off of vent Hx: COPD, OSA, and Obesity TCT AS TOLERATED - Ensure adequate pulmonary hygiene   - Per speech  therapy: downsize to size 6 when pt able to tolerate to be considered for PMV therapy   - Reculture sputum     Intermittent atrial fibrillation with rvr~ - Continuous cardiac monitoring - Continue scheduled midodrine   - Continue amiodarone  Avoid systemic anticoagulation S/p IVC filter placed   INFECTIOUS DISEASE Left necrotizing fascitis (strep pyogenes)-present on admission s/p multiple debridements, application of wound VAC system, s/p myriad matrix placement 05/17/2021 -myriad matrix failed Streptococcal toxic shock syndrome Pseudomonas in Tracheal wound culture 2/7  - Monitor fever curve - Trend WBC's, mild bump on leukocytosis today - Completed full course of abx therapy~02/17  - General Surgery following, appreciate input ~ wound care as per surgery - Vascular Surgery consulted for possible left AKA awaiting pts family decision regarding this  RECOMMEND L AKA - Leukocytosis may be related to ongoing issues with left lower extremity  ACUTE KIDNEY INJURY/Renal Failure -continue Foley Catheter-assess need -Avoid nephrotoxic agents -Follow urine output, BMP -Ensure adequate renal perfusion, optimize oxygenation -Renal dose medications HD with pressors as needed  Intake/Output Summary (Last 24 hours) at 06/10/2021 0726 Last data filed at 06/10/2021 0600 Gross per 24 hour  Intake 1333.76 ml  Output 800 ml  Net 533.76 ml     Shock liver with persistent transaminitis - Monitor hepatic function panel 2/27 - Monitor coags  - Avoid hepatotoxic drugs as able  Toxic metabolic encephalopathy~improving  Minor SAH  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  NO ANTICOAGULATION FOR  1 MONTH AND THEN NEED TO RE-ASSESS NO ANTIPLATELET AGENTS FOR 1 MONTH AND THEN NEED TO RE-ASSESS CAN POSSIBLY CONSIDER BABY ASA BUT WOULD NEED TO PROCEED WITH CAUTION -Continue Lexapro -Provide supportive care -Promote normal sleep/wake cycle -Promote family presence -Avoid sedating  meds as able -Consider Ritalin (must "washout" Lexapro first)   ACUTE ANEMIA- TRANSFUSE AS NEEDED CONSIDER TRANSFUSION  IF HGB<7 DVT PRX  with TED/SCD's ONLY -Unable to tolerate anticoagulants or SCD's for VTE Prophylaxis ~ IVC filter placed 2/15    ENDO - ICU hypoglycemic\Hyperglycemia protocol -check FSBS per protocol   GI GI PROPHYLAXIS as indicated  NUTRITIONAL STATUS DIET-->TF's as tolerated Constipation protocol as indicated   ELECTROLYTES -follow labs as needed -replace as needed -pharmacy consultation and following   Best practice:  Diet: Tube Feeds  Pain/Anxiety/Delirium protocol (if indicated): scheduled and prn oxycodone  VAP protocol (if indicated): Ordered  DVT prophylaxis: Unable to tolerate chemical prophylaxis s/p IVC filter placement  GI prophylaxis: PPI Glucose control:  SSI Yes Central venous access: Left IJ Trialysis Catheter Arterial line:  N/A Foley:  N/A Mobility:  bed rest  PT consulted: N/A Last date of multidisciplinary goals of care discussion [06/08/21] Code Status:  full code Disposition: SDU     DVT/GI PRX  assessed I Assessed the need for Labs I Assessed the need for Foley I Assessed the need for Central Venous Line Family Discussion when available I Assessed the need for Mobilization I made an Assessment of medications to be adjusted accordingly Safety Risk assessment completed  CASE DISCUSSED IN MULTIDISCIPLINARY ROUNDS WITH ICU TEAM     Critical Care Time devoted to patient care services described in this note is 45 minutes.  Critical care was necessary to treat /prevent imminent and life-threatening deterioration. Overall, patient is critically ill, prognosis is guarded.  Patient with Multiorgan failure and at high risk for cardiac arrest and death.    Corrin Parker, M.D.  [facility]  Medical Director [place] Director [facility] Cardio-Pulmonary Department

## 2021-06-10 NOTE — TOC Progression Note (Signed)
Transition of Care [facility]) - Progression Note    Patient Details  Name: Alexandra Foster MRN: [id number] Date of Birth: Nov 08, 1950  Transition of Care [facility]) CM/SW Contact  Shelbie Hutching, RN Phone Number: 06/10/2021, 11:19 AM  Clinical Narrative:    Current plan is for continued wound care left lower leg, no amputation.  Daughter is going tomorrow to tour [facility].  TOC following.     Expected Discharge Plan: [place] Barriers to Discharge: Continued Medical Work up  Expected Discharge Plan and Services Expected Discharge Plan: [place]   Discharge Planning Services: CM Consult   Living arrangements for the past 2 months: Single Family Home                                       Social Determinants of Health (SDOH) Interventions    Readmission Risk Interventions No flowsheet data found.

## 2021-06-10 NOTE — Progress Notes (Signed)
[facility]  PROGRESS NOTE   Subjective:   Family member at bedside.   UOP 450  Wbc 16.3K    Objective:  Vital signs in last 24 hours:  Temp:  [99.2 F (37.3 C)-99.5 F (37.5 C)] 99.2 F (37.3 C) (02/27 0752) Pulse Rate:  [79-97] 90 (02/27 0945) Resp:  [19-30] 29 (02/27 0945) BP: (81-130)/(40-71) 124/47 (02/27 0945) SpO2:  [94 %-100 %] 99 % (02/27 0945) FiO2 (%):  [28 %] 28 % (02/27 0830) Weight:  [111.8 kg] 111.8 kg (02/27 0500)  Weight change: -3.9 kg Filed Weights   06/08/21 1537 06/09/21 0452 06/10/21 0500  Weight: 115.5 kg 115.7 kg 111.8 kg    Intake/Output: I/O last 3 completed shifts: In: 1763.5 [I.V.:296; NG/GT:1267.5; IV Piggyback:200.1] Out: 1100 [Urine:600; Drains:450; Stool:50]   Intake/Output this shift:  Total I/O In: 510 [Other:180; NG/GT:330] Out: -   Physical Exam: General:  Critically ill  Head:  [place]/AT, OM moist  Eyes:  Anicteric  Neck:  tracheostomy in place  Lungs:  Scattered rhonchi, normal effort  Heart:  regular  Abdomen:   Soft, nontender  Extremities:  Left lower extremity wound vac 1+ right lower extremity edema  Neurologic:  Awake, alert, following commands  Skin:  No lesions  Access:  Left IJ temp HD catheter in situ    Basic Metabolic Panel: Recent Labs  Lab 06/05/21 0351 06/06/21 0521 06/07/21 0421 06/08/21 0430 06/09/21 0455 06/10/21 0508  NA 130* 131* 131* 130* 131*  --   K 4.4 5.1 4.5 5.2* 3.8  --   CL 94* 95* 91* 90* 94*  --   CO2 _0 --   GLUCOSE 194* 158* 83 102* 107*  --   BUN 97* 152* 80* 100* 70*  --   CREATININE 2.11* 2.95* 2.12* 2.85* 2.06*  --   CALCIUM 8.9 9.5 9.2 9.1 8.7*  --   MG 2.0  --  2.0 2.2 2.0 2.4  PHOS 4.1 4.9* 5.3* 6.9* 4.6  --      CBC: Recent Labs  Lab 06/06/21 0521 06/06/21 1615 06/07/21 0421 06/08/21 0430 06/09/21 0455 06/10/21 0508  WBC 8.7  --  12.6* 12.3* 14.6* 16.3*  NEUTROABS 6.3  --  9.1* 8.9* 11.0* 12.9*  HGB 6.9* 8.7* 8.4* 7.8* 7.8* 7.6*   HCT 21.7* 27.5* 26.2* 24.2* 24.9* 23.6*  MCV 88.6  --  87.3 87.4 88.6 88.4  PLT 255  --  266 259 282 290      Urinalysis: No results for input(s): COLORURINE, LABSPEC, PHURINE, GLUCOSEU, HGBUR, BILIRUBINUR, KETONESUR, PROTEINUR, UROBILINOGEN, NITRITE, LEUKOCYTESUR in the last 72 hours.  Invalid input(s): APPERANCEUR    Imaging: DG Chest Port 1 View  Result Date: 06/10/2021 CLINICAL DATA:  Respiratory failure. EXAM: PORTABLE CHEST 1 VIEW COMPARISON:  06/01/2021. FINDINGS: 4:13 a.m., 06/10/2021. Double-lumen left IJ approach catheter again terminates in the SVC with interval removal of the prior right IJ line. The tip of the tracheostomy cannula is 3.3 cm from the carina. The patient is rotated to the right today. The heart is enlarged there is increased central vascular distension without overt edema findings. There is moderate increased layering left pleural fluid, development of a small right pleural effusion, and increased opacity in the left lower zone which could be atelectasis or consolidation. The remainder of the lungs are clear. There is no pneumothorax. In all other respects no further changes. IMPRESSION: 1. Increased central vascular distension without overt edema findings. Stable cardiomegaly. 2. Worsening  moderate layering left pleural fluid, small right pleural effusion and increased opacity in the left lower zone which could be atelectasis or consolidation. Electronically Signed   By: Telford Nab M.D.   On: 06/10/2021 06:39     Medications:    sodium chloride Stopped (06/03/21 0902)   sodium chloride     feeding supplement (PIVOT 1.5 CAL) 1,000 mL (06/10/21 0753)   norepinephrine (LEVOPHED) Adult infusion Stopped (06/10/21 0831)    amiodarone  200 mg Per Tube BID   vitamin C  500 mg Per Tube BID   chlorhexidine gluconate (MEDLINE KIT)  15 mL Mouth Rinse BID   Chlorhexidine Gluconate Cloth  6 each Topical Q0600   cholecalciferol  2,000 Units Per Tube Daily    collagenase   Topical Daily   Darbepoetin Alfa  100 mcg Subcutaneous Q7 days   escitalopram  10 mg Per Tube Daily   free water  30 mL Per Tube Q4H   insulin aspart  0-20 Units Subcutaneous Q4H   insulin aspart  8 Units Subcutaneous Q4H   insulin detemir  23 Units Subcutaneous BID   ipratropium-albuterol  3 mL Nebulization TID   lactobacillus  1 g Per Tube TID WC   lip balm   Topical TID   magic mouthwash w/lidocaine  10 mL Oral QID   mouth rinse  15 mL Mouth Rinse 10 times per day   metoCLOPramide (REGLAN) injection  5 mg Intravenous Q8H   midodrine  15 mg Per Tube TID WC   multivitamin  1 tablet Per Tube QHS   nutrition supplement (JUVEN)  1 packet Per Tube BID BM   pantoprazole sodium  40 mg Per Tube Daily   sodium chloride flush  10-40 mL Intracatheter Q12H   valACYclovir  500 mg Per Tube Daily   vitamin A  10,000 Units Per Tube Daily    Assessment/ Plan:     Principal Problem:   Severe sepsis with septic shock (CODE) ([place]) Active Problems:   COPD (chronic obstructive pulmonary disease) (HCC)   Morbid obesity (HCC)   Diabetes mellitus type 2 in obese (HCC)   Atrial fibrillation and flutter (HCC)   Necrotizing fasciitis ([place])   Acute and chronic respiratory failure (acute-on-chronic) (HCC)   On mechanically assisted ventilation (HCC)   Shock liver   Anemia associated with acute blood loss   Metabolic acidosis   Encounter for continuous renal replacement therapy (CRRT) for acute renal failure (HCC)   Acute encephalopathy   Group A streptococcal infection   Streptococcal toxic shock syndrome ([place])   Status post tracheostomy ([place])   Subdural hemorrhage (HCC)   Left leg cellulitis   Abdominal distension  Alexandra Foster is a 71 y.o. black female with COPD/asthma, diabetes mellitus type II, hypertension, obstructive sleep apnea, paroxysmal atrial fibrillation, GERD, who presents to [facility] on 05/04/2021 for AKI (acute kidney injury) ([place]) [N17.9] Left leg cellulitis  [L03.116] Severe sepsis ([place]) [A41.9, R65.20] Severe sepsis with lactic acidosis ([place]) [A41.9, R65.20, E87.20] Found to have necrotizing fascitis requiring multiple debridements by Dr. Peyton Najjar: 1/24. 1/27, 2/3 and 2/10.    #1: Acute kidney injury: AKI is secondary to ischemic nephropathy secondary to hypotension complicated with sepsis. History of bland urine. Baseline creatinine of 0.98, GFR > 60 on 02/11/21.  Patient was on CRRT for prolonged period of time.  Transitioned to hemodialysis 06/04/2021. No need for renal placement therapy today. Anticipate dialysis for tomorrow.     #2: Sepsis and hypotension: necrotizing fasciitis  for left lower extremity status post multiple debridement.  Requiring vasopressors: norepinephrine. On stress dose steroids and midodrine -  Patient remains hemodynamically fragile, requiring pressors during dialysis .       - Restarted on midodrine this morning.   #3. Anemia with Acute kidney injury:  Lab Results  Component Value Date   HGB 7.6 (L) 06/10/2021    S/p multiple blood transfusions this admission Aranesp weekly SQ (mon) Continues to have blood loss through the wound VAC.  Hemoglobin 7.6   #4. Acute Respiratory Failure. Status post tracheostomy.  Continues to do well off the ventilator at the moment.     LOS: [place], [facility] 2/27/202310:07 AM

## 2021-06-11 ENCOUNTER — Inpatient Hospital Stay: Payer: Medicare HMO

## 2021-06-11 DIAGNOSIS — L03116 Cellulitis of left lower limb: Secondary | ICD-10-CM | POA: Diagnosis not present

## 2021-06-11 DIAGNOSIS — R6521 Severe sepsis with septic shock: Secondary | ICD-10-CM | POA: Diagnosis not present

## 2021-06-11 DIAGNOSIS — N179 Acute kidney failure, unspecified: Secondary | ICD-10-CM | POA: Diagnosis not present

## 2021-06-11 DIAGNOSIS — A419 Sepsis, unspecified organism: Secondary | ICD-10-CM | POA: Diagnosis not present

## 2021-06-11 LAB — RENAL FUNCTION PANEL
Albumin: 2 g/dL — ABNORMAL LOW (ref 3.5–5.0)
Anion gap: 9 (ref 5–15)
BUN: 136 mg/dL — ABNORMAL HIGH (ref 8–23)
CO2: 25 mmol/L (ref 22–32)
Calcium: 8.8 mg/dL — ABNORMAL LOW (ref 8.9–10.3)
Chloride: 98 mmol/L (ref 98–111)
Creatinine, Ser: 3.14 mg/dL — ABNORMAL HIGH (ref 0.44–1.00)
GFR, Estimated: 15 mL/min — ABNORMAL LOW (ref >60)
Glucose, Bld: 141 mg/dL — ABNORMAL HIGH (ref 70–99)
Phosphorus: 3.6 mg/dL (ref 2.5–4.6)
Potassium: 4.6 mmol/L (ref 3.5–5.1)
Sodium: 132 mmol/L — ABNORMAL LOW (ref 135–145)

## 2021-06-11 LAB — CBC WITH DIFFERENTIAL/PLATELET
Abs Immature Granulocytes: 0.5 10*3/uL — ABNORMAL HIGH (ref 0.00–0.07)
Basophils Absolute: 0 10*3/uL (ref 0.0–0.1)
Basophils Relative: 0 %
Eosinophils Absolute: 0.4 10*3/uL (ref 0.0–0.5)
Eosinophils Relative: 3 %
HCT: 24.5 % — ABNORMAL LOW (ref 36.0–46.0)
Hemoglobin: 7.6 g/dL — ABNORMAL LOW (ref 12.0–15.0)
Immature Granulocytes: 4 %
Lymphocytes Relative: 8 %
Lymphs Abs: 1.1 10*3/uL (ref 0.7–4.0)
MCH: 27.9 pg (ref 26.0–34.0)
MCHC: 31 g/dL (ref 30.0–36.0)
MCV: 90.1 fL (ref 80.0–100.0)
Monocytes Absolute: 0.6 10*3/uL (ref 0.1–1.0)
Monocytes Relative: 4 %
Neutro Abs: 11.5 10*3/uL — ABNORMAL HIGH (ref 1.7–7.7)
Neutrophils Relative %: 81 %
Platelets: 285 10*3/uL (ref 150–400)
RBC: 2.72 MIL/uL — ABNORMAL LOW (ref 3.87–5.11)
RDW: 18.1 % — ABNORMAL HIGH (ref 11.5–15.5)
WBC: 14.1 10*3/uL — ABNORMAL HIGH (ref 4.0–10.5)
nRBC: 0.4 % — ABNORMAL HIGH (ref 0.0–0.2)

## 2021-06-11 LAB — GLUCOSE, CAPILLARY
Glucose-Capillary: 113 mg/dL — ABNORMAL HIGH (ref 70–99)
Glucose-Capillary: 126 mg/dL — ABNORMAL HIGH (ref 70–99)
Glucose-Capillary: 132 mg/dL — ABNORMAL HIGH (ref 70–99)
Glucose-Capillary: 60 mg/dL — ABNORMAL LOW (ref 70–99)
Glucose-Capillary: 89 mg/dL (ref 70–99)
Glucose-Capillary: 90 mg/dL (ref 70–99)

## 2021-06-11 LAB — PROCALCITONIN: Procalcitonin: 4.11 ng/mL

## 2021-06-11 LAB — CULTURE, RESPIRATORY W GRAM STAIN

## 2021-06-11 LAB — MAGNESIUM: Magnesium: 2.5 mg/dL — ABNORMAL HIGH (ref 1.7–2.4)

## 2021-06-11 LAB — COPPER, SERUM: Copper: 24 ug/dL — ABNORMAL LOW (ref 80–158)

## 2021-06-11 MED ORDER — DEXTROSE 50 % IV SOLN: 12.5000 g | INTRAVENOUS | Status: AC

## 2021-06-11 MED ORDER — LOPERAMIDE HCL 2 MG PO CAPS
2.0000 mg | ORAL_CAPSULE | Freq: Once | ORAL | Status: AC
  Administered 2021-06-11: 2 mg via ORAL
  Filled 2021-06-11: qty 1

## 2021-06-11 MED ORDER — DEXTROSE 50 % IV SOLN
INTRAVENOUS | Status: AC
  Administered 2021-06-11: 12.5 g via INTRAVENOUS
  Filled 2021-06-11: qty 50

## 2021-06-11 MED ORDER — IOHEXOL 9 MG/ML PO SOLN: 500.0000 mL | Freq: Once | ORAL | Status: DC | PRN

## 2021-06-11 MED ORDER — VITAMIN E 45 MG (100 UNIT) PO CAPS
400.0000 [IU] | ORAL_CAPSULE | Freq: Every day | ORAL | Status: DC
  Administered 2021-06-11 – 2021-06-13 (×2): 400 [IU]
  Filled 2021-06-11 (×3): qty 4

## 2021-06-11 MED ORDER — DEXTROSE 5 % IV SOLN
2.0000 mg | Freq: Every day | INTRAVENOUS | Status: DC
  Administered 2021-06-11 – 2021-06-13 (×3): 2 mg via INTRAVENOUS
  Filled 2021-06-11 (×4): qty 5

## 2021-06-11 NOTE — Progress Notes (Signed)
NAME:  Alexandra Foster, MRN:  [id number], DOB:  10/29/1950, LOS: 24 ADMISSION DATE:  05/04/2021, CONSULTATION DATE:  05/04/2021 REFERRING MD:  Duffy Bruce MD CHIEF COMPLAINT:  Left leg pain   HPI  71 y.o female with medical history significant of Asthma/COPD, T2DM, HTN, OSA on CPAP, GERD, COVID-19 pneumonia, and Bilateral lower extremity edema who presented to the ED with chief complaints of LLE pain and chills since Thursday.  Patient report onset of symptoms for the past several days with worsening pain, swelling, fevers and chills, poor po intake and difficulty walking since Thursday. Denies injury to extremity or hx of cellulitis.  ED Course: In the emergency department, the temperature was 37.7C, the heart rate 103 beats/minute, the blood pressure 75/64  mm Hg, the respiratory rate 27 breaths/minute, and the oxygen saturation 99% on RA. CT of left lower leg showed diffuse subcutaneous edema concerning for cellulitis with no fluid collection/abscess. Pertinent Labs in Red/Diagnostics Findings: Na+/ K+: 141/2.9 Glucose: 144 BUN/Cr.: 44/3.47 AST/ALT:67/48   WBC/ TMAX: 16.5/ afebrile PCT: pending Lactic acid: 3.8>6.0 COVID PCR: Negative  Patient given 30 cc/kg of fluids and started on broad-spectrum antibiotics for sepsis with septic shock. Patient remained hypotensive despite IVF boluses therefore was started on Levophed. PCCM consulted.  Past Medical History    Arthritis   Asthma   COPD (chronic obstructive pulmonary disease) ([place])   Diabetes mellitus without complication ([place])   Endometriosis   GERD (gastroesophageal reflux disease)   Hypertension   Obesity   Sleep apnea    CPAP   Significant Hospital Events   1/21: Admitted to the ICU with severe sepsis with shock secondary to cellulitis of LLE 1/23: off pressors +Afib with RVR 1/24: OR for fasciotomy 1/25: post op resp failure with severe septic shock 1/26: remains on pressors 1/27: returns to the OR today for  debridement, wound VAC 1/28: overnight with hemodynamic deterioration, required reintubation, currently on pressors 1/29: remains on 3 pressors, CRRT, wound VAC and minimal sedation.  Awakens to voice and touch.  Seen by vascular surgery and awaiting input from general surgery regarding amputation of left leg 2/1: remains on pressors, on CRRT, MRI/CT shows SAH, afib with RVR, ECHO pending, restarted AMIO at 60 mg/hr 2/3: remains on crrt, remains on vent, plan for [facility] and wound vac change in OR 2/3: S/P TRACH, omplantation of cellular matrix on left leg 2/4: REMAINS ON PRESSORS, REMAINS ON VENT, REMAINS ON CRRT 2/5: REMAINS ON VENT, OFF PRESSORS, REMAINS ON CRRT 2/6: Patient is improved she is able to follow communication verbally and move extermities. She is on 5L/min Trache collar. We have stopped levophed and working on reducing vasopressin. She remains on CRRT.  Insulin infusion is ongoing and plan to have weight based regimen initiated. Additionally she will have SLP for vocalization and PMV trial as well as ROM training with OT/PT.  Multiple specialists on case appreciate everyone involved.  2/7: patient is improved mildly , she's off CRRT, shes off insulin drip. She is mentating.  She was evaluated by [facility].  She was unable to vocalize with PMV.  She may need PEG tube for nourishment.  2/8: patient is overnight worsened with fever and increased levophed dose.  She is negative 1.7L.   She is on zosyn.  2/9: patient is improved this am. She is giving thumbs up during examination. 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 She seems volume depleted  as well so we will start CVP monitoring and give IV LR 1L bolus today to check for volume responsiveness. Albumin also while on CRRT. PT/OT starting today. Type and screen repeat due to plan for OR in am. On zosyn with ID following. Starting [place] heparin today.   2/10: patient is s/p repeat surgery on RLE. She is on levophed 38mg post surgery. She is not tachycardic and able to follow some verbal communication.  2/11: patient stable overnight. Reordering PT/OT today. Wbc count slightly trending up remains on abx probably post surgical.  Remains severely anemic on CRRT and vasopressor support with tracheostomy.  2/12: patient with severe anemia today.  Reviewed with renal team for blood transfusion today.  2/13: remains critically ill, on pressors and CRRT 2/13: trach changed for a #8 cuffed distal XLT without difficulty. Wound cleaner. PER ENT 2/14: remains on pressors, CRRT, VENT, plan for PEG and WOUND vac change at bedside; received 1 unit of PRBC's due to anemia  2/15: s/p IVC filter placement due to inability to tolerate anticoagulation  2/16: plan for Trach collar trials as tolerated.  Continues on CRRT. Pt refusing nursing care and to work with PT/OT, Palliative Care re-consulted 2/17: Pt seems depressed, refusing nursing care.  Consult Psychiatry. 2/18: Dialysis filter continues to clot requiring filter changes with inability to return blood back.  Hgb 6.4 pt received 1 unit pRBC's.  Increased amiodarone frequency from 200 mg daily to bid due to pt converting in and out of atrial fibrillation with rvr  2/19: CRRT discontinued due to frequent clotting of circuit resulting in acute blood loss anemia due to inability to return blood back to pt  2/20: Pt currently off of vasopressors following iv fluid resuscitation  2/21: TCT for 30 hrs 2/22 TCT for 48 hrs, tolerated HD without pressors 2/23: General surgery discussing left AKA with pts family~pt with major stenotic complications on the ligaments and tendons resulting in difficult case for skin grafting in the future.  Vascular consulted for possible left AKA 2/25: Remains off vent and tolerating TC.  Plan for HD today.  Process for LTAC placement in process.  Pt's family is deciding on whether to  pursue amputation vs. Negative pressure dressing and local care 2/26: Required pressors for dialysis yesterday and has remained on low-dose norepinephrine.  She does acknowledge examiner but does not answer any questions shakes head "no" to everything asked, remains off ventilator   Consults:  [facility]  Nephrology ID  Speech  ENT   Significant Diagnostic Tests:  1/21: Chest Xray> no active cardiopulmonary process 1/21: CT left lower leg>Diffuse subcutaneous edema may represent cellulitis. No drainable fluid collection/abscess 1/21: Ultrasound lower unilateral left> no DVT  Micro Data:  1/21: SARS-CoV-2 PCR>>negative 1/21: Influenza PCR>>negative 1/21: Blood culture x2>>group A strep (strep pyogenes) 1/21: Urine Culture>negative  1/21: MRSA PCR>>negative  1/24: Left leg wound culture>>negative  2/7: [place] [place] would culture>>few pseudomonas aeruginosa and few candida albicans  2/8: MRSA PCR>> negative 2/26: few pseudomonas aeruginosa  Antimicrobials:  Vancomycin 1/21>>1/22 Cefepime 1/21>>01/22 Clindamycin 1/22>>1/25 Ceftriaxone 1/21 X1; restarted 1/31>>2/6 Penicillin G 1/22>>1/31 Linezolid 1/25>>1/31 Metronidazole 02/6 x1 dose Unasyn 2/7>>2/7 Zosyn 2/7>>2/17  Subjective:  No acute events overnight.  Leukocytosis improving wbc 14.1   OBJECTIVE  Blood pressure (!) 111/48, pulse 91, temperature 98.9 F (37.2 C), temperature source Axillary, resp. rate (!) 26, height _0  (1.651 m), weight 114.8 kg, SpO2 97 %.    FiO2 (%):  [28 %] 28 %  Intake/Output Summary (Last 24 hours) at 06/11/2021 0751 Last data filed at 06/11/2021 0600 Gross per 24 hour  Intake 2117.75 ml  Output 640 ml  Net 1477.75 ml   Filed Weights   06/09/21 0452 06/10/21 0500 06/11/21 0500  Weight: 115.7 kg 111.8 kg 114.8 kg     Physical Examination  General: acute on chronically ill appearing female, NAD resting in bed on trach collar HENT: supple, no JVD, size 8 XLT tracheostomy  present dressing dry/intact  Lungs: faint rhonchi diminished throughout, even, non labored, normal effort Cardiovascular: nsr, rrr, no M/R/G, 2+ radial/2+ distal pulses Abdomen: +BS x4, obese, soft, non tender, non distended  Extremities: LLE negative pressure dressing present clean dry and intact, LE wound as below Neuro: Awake, follows commands, generalized weakness (but no focal deficits) PERRL GU: Purewick intact draining yellow urine  Skin: see below      Labs   CBC: Recent Labs  Lab 06/07/21 0421 06/08/21 0430 06/09/21 0455 06/10/21 0508 06/11/21 0432  WBC 12.6* 12.3* 14.6* 16.3* 14.1*  NEUTROABS 9.1* 8.9* 11.0* 12.9* 11.5*  HGB 8.4* 7.8* 7.8* 7.6* 7.6*  HCT 26.2* 24.2* 24.9* 23.6* 24.5*  MCV 87.3 87.4 88.6 88.4 90.1  PLT 266 259 282 290 158    Basic Metabolic Panel: Recent Labs  Lab 06/07/21 0421 06/08/21 0430 06/09/21 0455 06/10/21 0508 06/11/21 0432  NA 131* 130* 131* 132* 132*  K 4.5 5.2* 3.8 4.1 4.6  CL 91* 90* 94* 92* 98  CO2 _0 GLUCOSE 83 102* 107* 113* 141*  BUN 80* 100* 70* 97* 136*  CREATININE 2.12* 2.85* 2.06* 2.81* 3.14*  CALCIUM 9.2 9.1 8.7* 8.9 8.8*  MG 2.0 2.2 2.0 2.4 2.5*  PHOS 5.3* 6.9* 4.6 4.5 3.6   GFR: Estimated Creatinine Clearance: 20.5 mL/min (A) (by C-G formula based on SCr of 3.14 mg/dL (H)). Recent Labs  Lab 06/08/21 0430 06/09/21 0455 06/10/21 0508 06/11/21 0432  WBC 12.3* 14.6* 16.3* 14.1*    Liver Function Tests: Recent Labs  Lab 06/07/21 0421 06/08/21 0430 06/09/21 0455 06/10/21 0508 06/11/21 0432  AST 81*  --   --  70*  --   ALT 118*  --   --  92*  --   ALKPHOS 193*  --   --  258*  --   BILITOT 1.6*  --   --  1.2  --   PROT 5.7*  --   --  5.5*  --   ALBUMIN 2.7*   2.8* 2.4* 2.2* 2.1*   2.1* 2.0*   Cardiac Enzymes: No results for input(s): CKTOTAL, CKMB, CKMBINDEX, TROPONINI in the last 168 hours.  HbA1C: Hgb A1c MFr Bld  Date/Time Value Ref Range Status  05/05/2021 04:53 AM 6.7 (H) 4.8 -  5.6 % Final    Comment:    (NOTE)         Prediabetes: 5.7 - 6.4         Diabetes: >6.4         Glycemic control for adults with diabetes: <7.0   02/11/2021 09:41 AM 6.3 (H) <5.7 % of total Hgb Final    Comment:    For someone without known diabetes, a hemoglobin  A1c value between 5.7% and 6.4% is consistent with prediabetes and should be confirmed with a  follow-up test. . For someone with known diabetes, a value <7% indicates that their diabetes is well controlled. A1c targets should be individualized based on duration of diabetes, age, comorbid conditions,  and other considerations. . This assay result is consistent with an increased risk of diabetes. . Currently, no consensus exists regarding use of hemoglobin A1c for diagnosis of diabetes for children. .     CBG: Recent Labs  Lab 06/10/21 1610 06/10/21 1959 06/10/21 2313 06/11/21 0356 06/11/21 0710  GLUCAP 146* 157* 155* 126* 132*    Review of Systems:   Unable to assess due to tracheostomy, patient is resistant to engage.  Severe sepsis with streptococcal toxic shock  Allergies No Known Allergies    Scheduled Meds:  amiodarone  200 mg Per Tube BID   vitamin C  500 mg Per Tube BID   chlorhexidine gluconate (MEDLINE KIT)  15 mL Mouth Rinse BID   Chlorhexidine Gluconate Cloth  6 each Topical Q0600   cholecalciferol  2,000 Units Per Tube Daily   collagenase   Topical Daily   Darbepoetin Alfa  100 mcg Subcutaneous Q7 days   escitalopram  10 mg Per Tube Daily   free water  30 mL Per Tube Q4H   insulin aspart  0-20 Units Subcutaneous Q4H   insulin aspart  8 Units Subcutaneous Q4H   insulin detemir  23 Units Subcutaneous BID   ipratropium-albuterol  3 mL Nebulization TID   lactobacillus  1 g Per Tube TID WC   lip balm   Topical TID   magic mouthwash w/lidocaine  10 mL Oral QID   mouth rinse  15 mL Mouth Rinse 10 times per day   metoCLOPramide (REGLAN) injection  5 mg Intravenous Q8H   midodrine  15 mg  Per Tube TID WC   multivitamin  1 tablet Per Tube QHS   nutrition supplement (JUVEN)  1 packet Per Tube BID BM   pantoprazole sodium  40 mg Per Tube Daily   sodium chloride flush  10-40 mL Intracatheter Q12H   valACYclovir  500 mg Per Tube Daily   vitamin A  10,000 Units Per Tube Daily   Continuous Infusions:  sodium chloride Stopped (06/03/21 0902)   sodium chloride 250 mL (06/11/21 0620)   feeding supplement (PIVOT 1.5 CAL) 65 mL/hr at 06/11/21 0400   norepinephrine (LEVOPHED) Adult infusion Stopped (06/10/21 0831)   PRN Meds:.sodium chloride, acetaminophen, albuterol, artificial tears, clonazePAM, HYDROmorphone (DILAUDID) injection, loperamide, ondansetron (ZOFRAN) IV, oxyCODONE, sennosides, sodium chloride flush  Assessment & Plan:   Ventilator Dependent Respiratory Failure s/p tracheostomy  Severe sepsis with streptococcal toxic shock was precipitating factor Weaned off of vent Hx: COPD, OSA, and Obesity - Continue trach collar - Intermittent chest x-ray & ABG PRN - Ensure adequate pulmonary hygiene   - Per speech therapy: downsize to size 6 when pt able to tolerate to be considered for PMV therapy    Intermittent atrial fibrillation with rvr~RESOLVED  - Continuous cardiac monitoring - Continue scheduled midodrine   - Continue amiodarone   Left necrotizing fascitis (strep pyogenes)-present on admission s/p multiple debridements, application of wound VAC system, s/p myriad matrix placement 05/17/2021 -myriad matrix failed Streptococcal toxic shock syndrome Pseudomonas in Tracheal wound culture 2/7  - Monitor fever curve - Trend WBC's, mild bump on leukocytosis today - Completed full course of abx therapy~02/17  - General Surgery following, appreciate input ~ wound care as per surgery - Vascular Surgery consulted for possible left AKA awaiting pts family decision regarding this  - Support left AKA - Leukocytosis may be related to ongoing issues with left lower extremity,  however temporary left ij trialysis catheter removed 02/27 for line holiday due to  worsening leukocytosis   Acute kidney injury secondary to ischemic nephropathy in the setting of hypotension complicated with sepsis  - Avoid nephrotoxic agents - Follow urine output, BMP - Ensure adequate renal perfusion, optimize oxygenation - Renal dose medications - Nephrology following, appreciate input~pt will need permcath placement if she continues to require HD    Shock liver with persistent transaminitis - Monitor hepatic function panel 2/27 - Monitor coags  - Avoid hepatotoxic drugs as able  Acute on chronic anemia~pt unable to tolerate chemical anticoagulation s/p IVC filter placement 05/29/2021 -Monitor for S/Sx of bleeding -Trend CBC -Unable to tolerate anticoagulants or SCD's for VTE Prophylaxis ~ IVC filter placed 2/15 -Transfuse for Hgb <7 - Hgb 7.8 today  Type II diabetes mellitus  -CBG's q4h; Target range of 140 to 180 -SSI -Follow ICU Hypo/Hyperglycemia protocol    Toxic metabolic encephalopathy~improving  Minor SAH  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  NO ANTICOAGULATION FOR  1 MONTH AND THEN NEED TO RE-ASSESS NO ANTIPLATELET AGENTS FOR 1 MONTH AND THEN NEED TO RE-ASSESS CAN POSSIBLY CONSIDER BABY ASA BUT WOULD NEED TO PROCEED WITH CAUTION -Continue Lexapro -Provide supportive care -Promote normal sleep/wake cycle -Promote family presence -Avoid sedating meds as able  Moderate malnutrition Sarcopenic obesity Debility/deconditioning - On tube feeds - Albumin 2.8 - Unable to participate on PT/OT due to issues above - AKA would facilitate, recovery time and participation with PT  Best practice:  Diet: Tube Feeds  Pain/Anxiety/Delirium protocol (if indicated): scheduled and prn oxycodone  VAP protocol (if indicated): Ordered  DVT prophylaxis: Unable to tolerate chemical prophylaxis s/p IVC filter placement  GI prophylaxis: PPI Glucose control:  SSI  Yes Central venous access: N/A Arterial line:  N/A Foley:  N/A Mobility:  bed rest  PT consulted: N/A Last date of multidisciplinary goals of care discussion [06/11/21] Code Status:  full code Disposition: SDU   = Goals of Care = Code Status Order: FULL  Primary Emergency ContactCHRISTOL, THETFORD, Home Phone: [phone]   Level 3 Follow-up     Donell Beers, [place] Pager [phone] (please enter 7 digits) PCCM Consult Pager [phone] (please enter 7 digits)

## 2021-06-11 NOTE — TOC Progression Note (Signed)
Transition of Care [facility]) - Progression Note    Patient Details  Name: Alexandra Foster MRN: [id number] Date of Birth: November 02, 1950  Transition of [facility]) CM/SW Contact  Shelbie Hutching, RN Phone Number: 06/11/2021, 11:46 AM  Clinical Narrative:    Daughter visited [place] and loved it.  They would like to go ahead and proceed with insurance auth.  Raquel Sarna with [facility] will start working on authorization.    Expected Discharge Plan: [place] Barriers to Discharge: Continued Medical Work up  Expected Discharge Plan and Services Expected Discharge Plan: [place]   Discharge Planning Services: CM Consult   Living arrangements for the past 2 months: Single Family Home                                       Social Determinants of Health (SDOH) Interventions    Readmission Risk Interventions No flowsheet data found.

## 2021-06-11 NOTE — Progress Notes (Signed)
[facility]  Daily Progress Note   Subjective  -   Matisyn Cabeza is a 71 year old female that was seen previously on this admission by vascular for other issues.  In this instance the patient is nearing discharge to an LTAC and is currently on hemodialysis.  In order to transition the patient will need a PermCath.  This has been discussed with the family by the primary ICU staff and they are aware of current planning.  At this time the patient's family has still not made decisions on possible left lower extremity amputation.  Objective Vitals:   06/11/21 1014 06/11/21 1259 06/11/21 1300 06/11/21 1419  BP: (!) 107/42     Pulse: 84 79    Resp: (!) 24 (!) 22 (!) 23   Temp:    97.7 F (36.5 C)  TempSrc:    Axillary  SpO2: 99% 98%  97%  Weight:      Height:        Intake/Output Summary (Last 24 hours) at 06/11/2021 1430 Last data filed at 06/11/2021 1420 Gross per 24 hour  Intake 2161.66 ml  Output 1290 ml  Net 871.66 ml     CV  RRR VASC  left IJ temp HD catheter  Laboratory CBC    Component Value Date/Time   WBC 14.1 (H) 06/11/2021 0432   HGB 7.6 (L) 06/11/2021 0432   HGB 13.5 08/11/2013 0933   HCT 24.5 (L) 06/11/2021 0432   HCT 42.6 08/11/2013 0933   PLT 285 06/11/2021 0432   PLT 300 08/11/2013 0933    BMET    Component Value Date/Time   NA 132 (L) 06/11/2021 0432   NA 138 08/11/2013 0933   K 4.6 06/11/2021 0432   K 3.3 (L) 08/11/2013 0933   CL 98 06/11/2021 0432   CL 102 08/11/2013 0933   CO2 25 06/11/2021 0432   CO2 32 08/11/2013 0933   GLUCOSE 141 (H) 06/11/2021 0432   GLUCOSE 107 (H) 08/11/2013 0933   BUN 136 (H) 06/11/2021 0432   BUN 13 08/11/2013 0933   CREATININE 3.14 (H) 06/11/2021 0432   CREATININE 0.98 02/11/2021 0941   CALCIUM 8.8 (L) 06/11/2021 0432   CALCIUM 9.2 08/11/2013 0933   GFRNONAA 15 (L) 06/11/2021 0432   GFRNONAA 61 07/14/2019 0940   GFRAA 71 07/14/2019 0940    Assessment/Planning: In order to transition  to a LTAC the patient will require PermCath for dialysis access.  I have discussed the risk, benefits and alternatives with the family at bedside.  They do agree to proceed with placement.  Kris Hartmann  06/11/2021, 2:30 PM

## 2021-06-11 NOTE — Plan of Care (Signed)
°  Problem: Education: Goal: Knowledge of General Education information will improve Description: Including pain rating scale, medication(s)/side effects and non-pharmacologic comfort measures Outcome: Progressing   Problem: Health Behavior/Discharge Planning: Goal: Ability to manage health-related needs will improve Outcome: Progressing   Problem: Pain Managment: Goal: General experience of comfort will improve Outcome: Progressing   Problem: Role Relationship: Goal: Method of communication will improve Outcome: Progressing   Problem: Education: Goal: Knowledge of disease and its progression will improve Outcome: Progressing   Problem: Clinical Measurements: Goal: Complications related to the disease process or treatment will be avoided or minimized Outcome: Progressing Goal: Dialysis access will remain free of complications Outcome: Progressing   Problem: Fluid Volume: Goal: Fluid volume balance will be maintained or improved Outcome: Progressing   Problem: Urinary Elimination: Goal: Progression of disease will be identified and treated Outcome: Progressing   Problem: Education: Goal: Knowledge of disease or condition will improve Outcome: Progressing Goal: Knowledge of secondary prevention will improve (SELECT ALL) Outcome: Progressing Goal: Knowledge of patient specific risk factors will improve (INDIVIDUALIZE FOR PATIENT) Outcome: Progressing Goal: Individualized Educational Video(s) Outcome: Progressing   Problem: Coping: Goal: Will verbalize positive feelings about self Outcome: Progressing Goal: Will identify appropriate support needs Outcome: Progressing   Problem: Health Behavior/Discharge Planning: Goal: Ability to manage health-related needs will improve Outcome: Progressing   Problem: Spontaneous Subarachnoid Hemorrhage Tissue Perfusion: Goal: Complications of Spontaneous Subarachnoid Hemorrhage will be minimized Outcome: Progressing Patient total  care unable to decrease trach size r/t wound under trach. Patient total care for all needs unable to move self or assist  Patient vomiting TF again CT scan follow up and abd xray  Patient making urine daily

## 2021-06-11 NOTE — Consult Note (Signed)
PHARMACY CONSULT NOTE  Pharmacy Consult for Electrolyte Monitoring and Replacement   Recent Labs: Potassium (mmol/L)  Date Value  06/11/2021 4.6  08/11/2013 3.3 (L)   Magnesium (mg/dL)  Date Value  06/11/2021 2.5 (H)   Calcium (mg/dL)  Date Value  06/11/2021 8.8 (L)   Calcium, Total (mg/dL)  Date Value  08/11/2013 9.2   Albumin (g/dL)  Date Value  06/11/2021 2.0 (L)  08/11/2013 3.1 (L)   Phosphorus (mg/dL)  Date Value  06/11/2021 3.6   Sodium (mmol/L)  Date Value  06/11/2021 132 (L)  08/11/2013 138   Assessment: 71 yo female with a previous medical history of diabetes mellitus type 2, hypertension, obstructive sleep apnea on CPAP, asthma/COPD, GERD, history of COVID-19 pneumonia presenting with lower left extremity pain and chills. Patient found to have severe sepsis and strep pyogenes bacteremia on admission and is currently intubated and sedated. LLL necrotizing fascitis. [facility] has been consulted to monitor and replace electrolytes.   -CRRT stopped 2/6, re-started (2/8>> 2/20) -s/p trach 2/3, tolerating trach collar trials --receiving iHD nephrology  Nutrition:  --Pivot per tube at 65 mL/hr --Free water per tube 30 mL q4h  Goal of Therapy:  Electrolytes within normal limits  Plan:  Mild stable hyponatremia, Fluid balance / adjustment as indicated per [facility] / nephrology F/u Hemodialysis plan Planning HD today per nephrology  No electrolyte replacement warranted for today Expect to improve w/ HD today. Na: 131>132; K: 3.8>4.1>4.6; Scr 2.06>2.81>3.14 (BL 0.98 PTA) Renal function panel, Mag ordered daily  Lorna Dibble  06/11/21

## 2021-06-11 NOTE — Progress Notes (Signed)
Nutrition Follow Up Note   DOCUMENTATION CODES:   Morbid obesity  INTERVENTION:   Continue Pivot 1.5 @ goal rate of 48m/hr  Free water flushes 334mq4 hours to maintain tube patency   Regimen provides 2340kcal/day, 146g/day protein and 136459may of free water   Continue Juven Fruit Punch BID via tube, each serving provides 95kcal and 2.5g of protein (amino acids glutamine and arginine)  Continue Rena-vit daily via tube   Continue Vitamin C 500m26mD via tube   Continue Vitamin A 10,000 units daily via tube   Continue Cholecalciferol 2000 units daily via tube   Add Copper Chloride 2mg 59mdaily x 7 days  Add Vitamin E 400 units daily via tube   NUTRITION DIAGNOSIS:   Increased nutrient needs related to wound healing as evidenced by estimated needs.  GOAL:   Provide needs based on ASPEN/SCCM guidelines -met with tube feeds   MONITOR:   Labs, Weight trends, TF tolerance, Skin, I & O's, Vent status  ASSESSMENT:   70 y.36 female with a history of asthma/COPD, HTN, DMII, obesity, OSA on CPAP, GERD and COVID-19 pneumonia who was admitted 1/22 w/ left LE cellulitis, septic shock, S pyogenes bacteremia, hypotension, new Afib and AKI.  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 PEG tube in place. Pt tolerating tube feeds at goal rate. Copper and vitamin E labs returned low; will start supplementation. Per chart, pt is weight stable. Pt +9.5L on her I & Os. Nephrology following for HD; pt will need perm cath. [facility]   Medications reviewed and include: vitamin C, D3, insulin, lactobacillus, imodium, reglan, rena-vit, juven, protonix,  vitamin A  Labs reviewed: Na 132(L), K 4.6 wnl, BUN 136(H), creat 3.14(H), P 3.6 wnl, Mg 2.5(H) Copper 24(L)- 2/24 Vitamin D 16.13(L), vitamin A 56.5 wnl, vitamin E 7.2(L), zinc 119(H)- 2/21 WBC- 14.1(H), Hgb 7.6(L), Hct 24.5(L) Cbgs- 113, 132, 126 x 24hrs  Drains- 290ml 80mP- 300ml  80mt Order:   Diet Order     None      EDUCATION NEEDS:   Education needs have been addressed  Skin: WOC assessment reviewed: 40 x 35 cm left leg wound with VAC, lower lip .5X.5cm, upper lip 1X1.5 cm, middle back 1.5X1.5X.1cm, right posterior flank 3X10X.2cm, right upper buttock Stage 2 pressure injury; 1X1X.1cm, inner gluteal fold  1X.1X.1cm, inner buttocks1X1X.1cm.  Last BM:  2/28- 200ml [id number]  Height:   Ht Readings from Last 1 Encounters:  05/31/21 _0  (1.651 m)    Weight:   Wt Readings from Last 1 Encounters:  06/11/21 114.8 kg    Ideal Body Weight:  56.8 kg  BMI:  Body mass index is 42.12 kg/m.  Estimated Nutritional Needs:   Kcal:  2200-2500kcal/day  Protein:  120-140 grams  Fluid:  1.7-2.0L/day  Alexandra Foster Please refer to [facility]and/or RD on-call/weekend/after hours pager

## 2021-06-11 NOTE — Progress Notes (Signed)
[facility]  PROGRESS NOTE   Subjective:   Dialysis catheter removed.   Wound vac changed yesterday.   Family wants patient moved to [place]. Still does not want amputation.   UOP 344m.    Objective:  Vital signs in last 24 hours:  Temp:  [98.9 F (37.2 C)-100.4 F (38 C)] 98.9 F (37.2 C) (02/28 0713) Pulse Rate:  [79-100] 79 (02/28 1259) Resp:  [20-32] 23 (02/28 1300) BP: (100-127)/(41-54) 107/42 (02/28 1014) SpO2:  [95 %-99 %] 98 % (02/28 1259) FiO2 (%):  [28 %] 28 % (02/28 1259) Weight:  [114.8 kg] 114.8 kg (02/28 0500)  Weight change: 3 kg Filed Weights   06/09/21 0452 06/10/21 0500 06/11/21 0500  Weight: 115.7 kg 111.8 kg 114.8 kg    Intake/Output: I/O last 3 completed shifts: In: 3356.6 [I.V.:148.9; Other:360; NG/GT:2847.8] Out: 990 [Urine:550; Drains:340; Stool:100]   Intake/Output this shift:  Total I/O In: 667.6 [Other:280; NG/GT:387.6] Out: 650 [Urine:300; Drains:150; Stool:200]  Physical Exam: General:  Critically ill  Head:  [place]/AT, OM moist  Eyes:  Anicteric  Neck:  tracheostomy in place  Lungs:  Scattered rhonchi, normal effort  Heart:  regular  Abdomen:   Soft, nontender  Extremities:  Left lower extremity wound vac 1+ right lower extremity edema  Neurologic:  Awake, alert, following commands  Skin:  No lesions  Access:  Left IJ temp HD catheter      Basic Metabolic Panel: Recent Labs  Lab 06/07/21 0421 06/08/21 0430 06/09/21 0455 06/10/21 0508 06/11/21 0432  NA 131* 130* 131* 132* 132*  K 4.5 5.2* 3.8 4.1 4.6  CL 91* 90* 94* 92* 98  CO2 _0 GLUCOSE 83 102* 107* 113* 141*  BUN 80* 100* 70* 97* 136*  CREATININE 2.12* 2.85* 2.06* 2.81* 3.14*  CALCIUM 9.2 9.1 8.7* 8.9 8.8*  MG 2.0 2.2 2.0 2.4 2.5*  PHOS 5.3* 6.9* 4.6 4.5 3.6     CBC: Recent Labs  Lab 06/07/21 0421 06/08/21 0430 06/09/21 0455 06/10/21 0508 06/11/21 0432  WBC 12.6* 12.3* 14.6* 16.3* 14.1*  NEUTROABS 9.1* 8.9* 11.0* 12.9* 11.5*   HGB 8.4* 7.8* 7.8* 7.6* 7.6*  HCT 26.2* 24.2* 24.9* 23.6* 24.5*  MCV 87.3 87.4 88.6 88.4 90.1  PLT 266 259 282 290 285      Urinalysis: No results for input(s): COLORURINE, LABSPEC, PHURINE, GLUCOSEU, HGBUR, BILIRUBINUR, KETONESUR, PROTEINUR, UROBILINOGEN, NITRITE, LEUKOCYTESUR in the last 72 hours.  Invalid input(s): APPERANCEUR    Imaging: DG Chest Port 1 View  Result Date: 06/10/2021 CLINICAL DATA:  Respiratory failure. EXAM: PORTABLE CHEST 1 VIEW COMPARISON:  06/01/2021. FINDINGS: 4:13 a.m., 06/10/2021. Double-lumen left IJ approach catheter again terminates in the SVC with interval removal of the prior right IJ line. The tip of the tracheostomy cannula is 3.3 cm from the carina. The patient is rotated to the right today. The heart is enlarged there is increased central vascular distension without overt edema findings. There is moderate increased layering left pleural fluid, development of a small right pleural effusion, and increased opacity in the left lower zone which could be atelectasis or consolidation. The remainder of the lungs are clear. There is no pneumothorax. In all other respects no further changes. IMPRESSION: 1. Increased central vascular distension without overt edema findings. Stable cardiomegaly. 2. Worsening moderate layering left pleural fluid, small right pleural effusion and increased opacity in the left lower zone which could be atelectasis or consolidation. Electronically Signed   By: KNinfa LindenD.  On: 06/10/2021 06:39     Medications:    sodium chloride Stopped (06/03/21 0902)   sodium chloride 250 mL (06/11/21 0620)   copper chloride IVPB     feeding supplement (PIVOT 1.5 CAL) 65 mL/hr at 06/11/21 0400   norepinephrine (LEVOPHED) Adult infusion Stopped (06/10/21 0831)    amiodarone  200 mg Per Tube BID   vitamin C  500 mg Per Tube BID   chlorhexidine gluconate (MEDLINE KIT)  15 mL Mouth Rinse BID   Chlorhexidine Gluconate Cloth  6 each Topical  Q0600   cholecalciferol  2,000 Units Per Tube Daily   collagenase   Topical Daily   Darbepoetin Alfa  100 mcg Subcutaneous Q7 days   escitalopram  10 mg Per Tube Daily   free water  30 mL Per Tube Q4H   insulin aspart  0-20 Units Subcutaneous Q4H   insulin aspart  8 Units Subcutaneous Q4H   insulin detemir  23 Units Subcutaneous BID   ipratropium-albuterol  3 mL Nebulization TID   lactobacillus  1 g Per Tube TID WC   lip balm   Topical TID   mouth rinse  15 mL Mouth Rinse 10 times per day   metoCLOPramide (REGLAN) injection  5 mg Intravenous Q8H   midodrine  15 mg Per Tube TID WC   multivitamin  1 tablet Per Tube QHS   nutrition supplement (JUVEN)  1 packet Per Tube BID BM   pantoprazole sodium  40 mg Per Tube Daily   sodium chloride flush  10-40 mL Intracatheter Q12H   valACYclovir  500 mg Per Tube Daily   vitamin A  10,000 Units Per Tube Daily   vitamin E  400 Units Per Tube Daily    Assessment/ Plan:     Principal Problem:   Severe sepsis with septic shock (CODE) (HCC) Active Problems:   COPD (chronic obstructive pulmonary disease) (HCC)   Morbid obesity (HCC)   Diabetes mellitus type 2 in obese (HCC)   Atrial fibrillation and flutter ([place])   Necrotizing fasciitis ([place])   Acute and chronic respiratory failure (acute-on-chronic) (HCC)   On mechanically assisted ventilation (HCC)   Shock liver   Anemia associated with acute blood loss   Metabolic acidosis   Encounter for continuous renal replacement therapy (CRRT) for acute renal failure (HCC)   Acute encephalopathy   Group A streptococcal infection   Streptococcal toxic shock syndrome ([place])   Status post tracheostomy ([place])   Subdural hemorrhage (HCC)   Left leg cellulitis   Abdominal distension  Alexandra Foster is a 71 y.o. black female with COPD/asthma, diabetes mellitus type II, hypertension, obstructive sleep apnea, paroxysmal atrial fibrillation, GERD, who [facility] to [facility]21 for AKI (acute  kidney injury) ([place]) [N17.9] Left leg cellulitis [L03.116] Severe sepsis ([place]) [A41.9, R65.20] Severe sepsis with lactic acidosis ([place]) [A41.9, R65.20, E87.20] Found to have necrotizing fascitis requiring multiple debridements by Dr. Peyton Najjar: 1/24. 1/27, 2/3 and 2/10.    #1: Acute kidney injury: AKI is secondary to ischemic nephropathy secondary to hypotension complicated with sepsis. History of bland urine. Baseline creatinine of 0.98, GFR > 60 on 02/11/21.  Patient was on CRRT for prolonged period of time.  Transitioned to hemodialysis 06/04/2021. Plan on dialysis catheter placement and hemodialysis for later this week.     #2: Sepsis and hypotension: necrotizing fasciitis for left lower extremity status post multiple debridement.  Requiring vasopressors: norepinephrine. On stress dose steroids and midodrine -  Patient remains hemodynamically fragile,  required pressors during dialysis .       - MIdodrine  #3. Anemia with Acute kidney injury:  Lab Results  Component Value Date   HGB 7.6 (L) 06/11/2021    S/p multiple blood transfusions this admission Aranesp weekly SQ (mon) Continues to have blood loss through the wound VAC.     #4. Acute Respiratory Failure. Status post tracheostomy.  Continues to do well off the ventilator      LOS: Wallace, [facility] 2/28/20232:14 PM

## 2021-06-12 ENCOUNTER — Encounter
Admission: EM | Disposition: A | Discharge status: Nursing Home (Includes ICF) | Payer: Self-pay | Source: Home / Self Care | Attending: Internal Medicine

## 2021-06-12 ENCOUNTER — Encounter (HOSPITAL_COMMUNITY): Payer: Self-pay | Admitting: Interventional Radiology

## 2021-06-12 ENCOUNTER — Inpatient Hospital Stay: Payer: Medicare HMO | Admitting: Radiology

## 2021-06-12 ENCOUNTER — Inpatient Hospital Stay: Payer: Medicare HMO

## 2021-06-12 DIAGNOSIS — A419 Sepsis, unspecified organism: Secondary | ICD-10-CM | POA: Diagnosis not present

## 2021-06-12 HISTORY — PX: IR REPLC GASTRO/COLONIC TUBE PERCUT W/FLUORO: IMG2333

## 2021-06-12 HISTORY — PX: IR PERC TUN PERIT CATH WO PORT S&I /IMAG: IMG2327

## 2021-06-12 HISTORY — PX: IR FLUORO GUIDE CV LINE RIGHT: IMG2283

## 2021-06-12 LAB — GLUCOSE, CAPILLARY
Glucose-Capillary: 111 mg/dL — ABNORMAL HIGH (ref 70–99)
Glucose-Capillary: 137 mg/dL — ABNORMAL HIGH (ref 70–99)
Glucose-Capillary: 70 mg/dL (ref 70–99)
Glucose-Capillary: 70 mg/dL (ref 70–99)
Glucose-Capillary: 71 mg/dL (ref 70–99)
Glucose-Capillary: 72 mg/dL (ref 70–99)
Glucose-Capillary: 72 mg/dL (ref 70–99)
Glucose-Capillary: 73 mg/dL (ref 70–99)
Glucose-Capillary: 74 mg/dL (ref 70–99)
Glucose-Capillary: 76 mg/dL (ref 70–99)
Glucose-Capillary: 77 mg/dL (ref 70–99)
Glucose-Capillary: 88 mg/dL (ref 70–99)
Glucose-Capillary: 94 mg/dL (ref 70–99)

## 2021-06-12 LAB — RENAL FUNCTION PANEL
Albumin: 1.9 g/dL — ABNORMAL LOW (ref 3.5–5.0)
Anion gap: 12 (ref 5–15)
BUN: 154 mg/dL — ABNORMAL HIGH (ref 8–23)
CO2: 25 mmol/L (ref 22–32)
Calcium: 9.1 mg/dL (ref 8.9–10.3)
Chloride: 94 mmol/L — ABNORMAL LOW (ref 98–111)
Creatinine, Ser: 3.4 mg/dL — ABNORMAL HIGH (ref 0.44–1.00)
GFR, Estimated: 14 mL/min — ABNORMAL LOW (ref >60)
Glucose, Bld: 75 mg/dL (ref 70–99)
Phosphorus: 4.2 mg/dL (ref 2.5–4.6)
Potassium: 5.3 mmol/L — ABNORMAL HIGH (ref 3.5–5.1)
Sodium: 131 mmol/L — ABNORMAL LOW (ref 135–145)

## 2021-06-12 LAB — CBC WITH DIFFERENTIAL/PLATELET
Abs Immature Granulocytes: 0.36 10*3/uL — ABNORMAL HIGH (ref 0.00–0.07)
Basophils Absolute: 0.1 10*3/uL (ref 0.0–0.1)
Basophils Relative: 1 %
Eosinophils Absolute: 0.7 10*3/uL — ABNORMAL HIGH (ref 0.0–0.5)
Eosinophils Relative: 5 %
HCT: 25.8 % — ABNORMAL LOW (ref 36.0–46.0)
Hemoglobin: 8 g/dL — ABNORMAL LOW (ref 12.0–15.0)
Immature Granulocytes: 3 %
Lymphocytes Relative: 11 %
Lymphs Abs: 1.5 10*3/uL (ref 0.7–4.0)
MCH: 27.5 pg (ref 26.0–34.0)
MCHC: 31 g/dL (ref 30.0–36.0)
MCV: 88.7 fL (ref 80.0–100.0)
Monocytes Absolute: 0.8 10*3/uL (ref 0.1–1.0)
Monocytes Relative: 5 %
Neutro Abs: 10.5 10*3/uL — ABNORMAL HIGH (ref 1.7–7.7)
Neutrophils Relative %: 75 %
Platelets: 316 10*3/uL (ref 150–400)
RBC: 2.91 MIL/uL — ABNORMAL LOW (ref 3.87–5.11)
RDW: 17.6 % — ABNORMAL HIGH (ref 11.5–15.5)
Smear Review: NORMAL
WBC: 13.8 10*3/uL — ABNORMAL HIGH (ref 4.0–10.5)
nRBC: 0.1 % (ref 0.0–0.2)

## 2021-06-12 LAB — MAGNESIUM: Magnesium: 2.7 mg/dL — ABNORMAL HIGH (ref 1.7–2.4)

## 2021-06-12 SURGERY — DIALYSIS/PERMA CATHETER INSERTION
Anesthesia: Moderate Sedation

## 2021-06-12 MED ORDER — DEXTROSE 10 % IV SOLN: INTRAVENOUS | Status: DC

## 2021-06-12 MED ORDER — MIDAZOLAM HCL 2 MG/2ML IJ SOLN
INTRAMUSCULAR | Status: AC
  Filled 2021-06-12: qty 2

## 2021-06-12 MED ORDER — GADOBUTROL 1 MMOL/ML IV SOLN
10.0000 mL | Freq: Once | INTRAVENOUS | Status: AC | PRN
  Administered 2021-06-12: 10 mL via INTRAVENOUS

## 2021-06-12 MED ORDER — HEPARIN SODIUM (PORCINE) 1000 UNIT/ML IJ SOLN
INTRAMUSCULAR | Status: AC
  Filled 2021-06-12: qty 10

## 2021-06-12 MED ORDER — FENTANYL CITRATE (PF) 100 MCG/2ML IJ SOLN
INTRAMUSCULAR | Status: AC
  Filled 2021-06-12: qty 2

## 2021-06-12 MED ORDER — MIDAZOLAM HCL 2 MG/2ML IJ SOLN
INTRAMUSCULAR | Status: DC | PRN
  Administered 2021-06-12: 1 mg via INTRAVENOUS

## 2021-06-12 MED ORDER — HEPARIN SODIUM (PORCINE) 5000 UNIT/ML IJ SOLN
INTRAMUSCULAR | Status: DC | PRN
  Administered 2021-06-12: 10000 [IU] via INTRAVENOUS

## 2021-06-12 MED ORDER — LIDOCAINE-EPINEPHRINE 1 %-1:100000 IJ SOLN
INTRAMUSCULAR | Status: AC
  Administered 2021-06-12: 12 mL
  Filled 2021-06-12: qty 1

## 2021-06-12 MED ORDER — DEXTROSE 5 % IV SOLN: INTRAVENOUS | Status: DC

## 2021-06-12 NOTE — Progress Notes (Signed)
NAME:  Alexandra Foster, MRN:  [id number], DOB:  Oct 24, 1950, LOS: 5 ADMISSION DATE:  05/04/2021, CONSULTATION DATE:  05/04/2021 REFERRING MD:  Duffy Bruce MD CHIEF COMPLAINT:  Left leg pain   HPI  70 y.o female with medical history significant of Asthma/COPD, T2DM, HTN, OSA on CPAP, GERD, COVID-19 pneumonia, and Bilateral lower extremity edema who presented to the ED with chief complaints of LLE pain and chills since Thursday.  Patient report onset of symptoms for the past several days with worsening pain, swelling, fevers and chills, poor po intake and difficulty walking since Thursday. Denies injury to extremity or hx of cellulitis.  ED Course: In the emergency department, the temperature was 37.7C, the heart rate 103 beats/minute, the blood pressure 75/64  mm Hg, the respiratory rate 27 breaths/minute, and the oxygen saturation 99% on RA. CT of left lower leg showed diffuse subcutaneous edema concerning for cellulitis with no fluid collection/abscess. Pertinent Labs in Red/Diagnostics Findings: Na+/ K+: 141/2.9 Glucose: 144 BUN/Cr.: 44/3.47 AST/ALT:67/48   WBC/ TMAX: 16.5/ afebrile PCT: pending Lactic acid: 3.8>6.0 COVID PCR: Negative  Patient given 30 cc/kg of fluids and started on broad-spectrum antibiotics for sepsis with septic shock. Patient remained hypotensive despite IVF boluses therefore was started on Levophed. PCCM consulted.  Past Medical History    Arthritis   Asthma   COPD (chronic obstructive pulmonary disease) ([place])   Diabetes mellitus without complication ([place])   Endometriosis   GERD (gastroesophageal reflux disease)   Hypertension   Obesity   Sleep apnea    CPAP   Significant Hospital Events   1/21: Admitted to the ICU with severe sepsis with shock secondary to cellulitis of LLE 1/23: off pressors +Afib with RVR 1/24: OR for fasciotomy 1/25: post op resp failure with severe septic shock 1/26: remains on pressors 1/27: returns to the OR today for  debridement, wound VAC 1/28: overnight with hemodynamic deterioration, required reintubation, currently on pressors 1/29: remains on 3 pressors, CRRT, wound VAC and minimal sedation.  Awakens to voice and touch.  Seen by vascular surgery and awaiting input from general surgery regarding amputation of left leg 2/1: remains on pressors, on CRRT, MRI/CT shows SAH, afib with RVR, ECHO pending, restarted AMIO at 60 mg/hr 2/3: remains on crrt, remains on vent, plan for [facility] and wound vac change in OR 2/3: S/P TRACH, omplantation of cellular matrix on left leg 2/4: REMAINS ON PRESSORS, REMAINS ON VENT, REMAINS ON CRRT 2/5: REMAINS ON VENT, OFF PRESSORS, REMAINS ON CRRT 2/6: Patient is improved she is able to follow communication verbally and move extermities. She is on 5L/min Trache collar. We have stopped levophed and working on reducing vasopressin. She remains on CRRT.  Insulin infusion is ongoing and plan to have weight based regimen initiated. Additionally she will have SLP for vocalization and PMV trial as well as ROM training with OT/PT.  Multiple specialists on case appreciate everyone involved.  2/7: patient is improved mildly , she's off CRRT, shes off insulin drip. She is mentating.  She was evaluated by [facility].  She was unable to vocalize with PMV.  She may need PEG tube for nourishment.  2/8: patient is overnight worsened with fever and increased levophed dose.  She is negative 1.7L.   She is on zosyn.  2/9: patient is improved this am. She is giving thumbs up during examination. 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 She seems volume depleted  as well so we will start CVP monitoring and give IV LR 1L bolus today to check for volume responsiveness. Albumin also while on CRRT. PT/OT starting today. Type and screen repeat due to plan for OR in am. On zosyn with ID following. Starting [place] heparin today.   2/10: patient is s/p repeat surgery on RLE. She is on levophed 72mg post surgery. She is not tachycardic and able to follow some verbal communication.  2/11: patient stable overnight. Reordering PT/OT today. Wbc count slightly trending up remains on abx probably post surgical.  Remains severely anemic on CRRT and vasopressor support with tracheostomy.  2/12: patient with severe anemia today.  Reviewed with renal team for blood transfusion today.  2/13: remains critically ill, on pressors and CRRT 2/13: trach changed for a #8 cuffed distal XLT without difficulty. Wound cleaner. PER ENT 2/14: remains on pressors, CRRT, VENT, plan for PEG and WOUND vac change at bedside; received 1 unit of PRBC's due to anemia  2/15: s/p IVC filter placement due to inability to tolerate anticoagulation  2/16: plan for Trach collar trials as tolerated.  Continues on CRRT. Pt refusing nursing care and to work with PT/OT, Palliative Care re-consulted 2/17: Pt seems depressed, refusing nursing care.  Consult Psychiatry. 2/18: Dialysis filter continues to clot requiring filter changes with inability to return blood back.  Hgb 6.4 pt received 1 unit pRBC's.  Increased amiodarone frequency from 200 mg daily to bid due to pt converting in and out of atrial fibrillation with rvr  2/19: CRRT discontinued due to frequent clotting of circuit resulting in acute blood loss anemia due to inability to return blood back to pt  2/20: Pt currently off of vasopressors following iv fluid resuscitation  2/21: TCT for 30 hrs 2/22 TCT for 48 hrs, tolerated HD without pressors 2/23: General surgery discussing left AKA with pts family~pt with major stenotic complications on the ligaments and tendons resulting in difficult case for skin grafting in the future.  Vascular consulted for possible left AKA 2/25: Remains off vent and tolerating TC.  Plan for HD today.  Process for LTAC placement in process.  Pt's family is deciding on whether to  pursue amputation vs. Negative pressure dressing and local care 2/26: Required pressors for dialysis yesterday and has remained on low-dose norepinephrine.  She does acknowledge examiner but does not answer any questions shakes head "no" to everything asked, remains off ventilator, VASC CATH REMOVED FOR LINE HOLIDAY 2/28 off vent off pressors, CT ABD SHOWS ?PANCREATIC MASS, PEG TUBE DISLODGED   Consults:  PCCM General Surgery  Nephrology ID  Speech  ENT   Significant Diagnostic Tests:  1/21: Chest Xray> no active cardiopulmonary process 1/21: CT left lower leg>Diffuse subcutaneous edema may represent cellulitis. No drainable fluid collection/abscess 1/21: Ultrasound lower unilateral left> no DVT  Micro Data:  1/21: SARS-CoV-2 PCR>>negative 1/21: Influenza PCR>>negative 1/21: Blood culture x2>>group A strep (strep pyogenes) 1/21: Urine Culture>negative  1/21: MRSA PCR>>negative  1/24: Left leg wound culture>>negative  2/7: TLurline [place] would culture>>few pseudomonas aeruginosa and few candida albicans  2/8: MRSA PCR>> negative 2/26: few pseudomonas aeruginosa  Antimicrobials:  Vancomycin 1/21>>1/22 Cefepime 1/21>>01/22 Clindamycin 1/22>>1/25 Ceftriaxone 1/21 X1; restarted 1/31>>2/6 Penicillin G 1/22>>1/31 Linezolid 1/25>>1/31 Metronidazole 02/6 x1 dose Unasyn 2/7>>2/7 Zosyn 2/7>>2/17  Subjective:  No acute events overnight S/p TRACH  PEG TUBE NEEDS TO BE REPLACED PERM CATH/VASC CATH NEEDS TO BE PLACED PATIENT WILL NEED MRI/CT CONTRASTED STUDY TO ASSESS PANCREATIC MASS   OBJECTIVE  Blood  pressure (!) 100/54, pulse 79, temperature 98.2 F (36.8 C), resp. rate (!) 23, height _0  (1.651 m), weight 114.5 kg, SpO2 97 %.    FiO2 (%):  [28 %] 28 %   Intake/Output Summary (Last 24 hours) at 06/12/2021 4010 Last data filed at 06/12/2021 0600 Gross per 24 hour  Intake 2014.48 ml  Output 1200 ml  Net 814.48 ml    Filed Weights   06/10/21 0500 06/11/21 0500 06/12/21 0500   Weight: 111.8 kg 114.8 kg 114.5 kg    REVIEW OF SYSTEMS LIMITED BASED ON PATIENTS MOOD    PHYSICAL EXAMINATION:  GENERAL:critically ill appearing,  EYES: Pupils equal, round, reactive to light.  No scleral icterus.  MOUTH: Moist mucosal membrane NECK: Supple. S/p TRACH PULMONARY: +rhonchi,  CARDIOVASCULAR: S1 and S2.  No murmurs  GASTROINTESTINAL: Soft, nontender, -distended. Positive bowel sounds.  S/p PEG  NEURO Awake, follows commands, generalized weakness (but no focal deficits) PERRL Extremities: LLE negative pressure dressing present clean dry and intact, LE wound as below      Labs   CBC: Recent Labs  Lab 06/08/21 0430 06/09/21 0455 06/10/21 0508 06/11/21 0432 06/12/21 0414  WBC 12.3* 14.6* 16.3* 14.1* 13.8*  NEUTROABS 8.9* 11.0* 12.9* 11.5* 10.5*  HGB 7.8* 7.8* 7.6* 7.6* 8.0*  HCT 24.2* 24.9* 23.6* 24.5* 25.8*  MCV 87.4 88.6 88.4 90.1 88.7  PLT 259 282 290 285 316     Basic Metabolic Panel: Recent Labs  Lab 06/08/21 0430 06/09/21 0455 06/10/21 0508 06/11/21 0432 06/12/21 0414  NA 130* 131* 132* 132* 131*  K 5.2* 3.8 4.1 4.6 5.3*  CL 90* 94* 92* 98 94*  CO2 _1 GLUCOSE 102* 107* 113* 141* 75  BUN 100* 70* 97* 136* 154*  CREATININE 2.85* 2.06* 2.81* 3.14* 3.40*  CALCIUM 9.1 8.7* 8.9 8.8* 9.1  MG 2.2 2.0 2.4 2.5* 2.7*  PHOS 6.9* 4.6 4.5 3.6 4.2    GFR: Estimated Creatinine Clearance: 18.9 mL/min (A) (by C-G formula based on SCr of 3.4 mg/dL (H)). Recent Labs  Lab 06/09/21 0455 06/10/21 0508 06/11/21 0432 06/12/21 0414  PROCALCITON  --   --  4.11  --   WBC 14.6* 16.3* 14.1* 13.8*     Liver Function Tests: Recent Labs  Lab 06/07/21 0421 06/08/21 0430 06/09/21 0455 06/10/21 0508 06/11/21 0432 06/12/21 0414  AST 81*  --   --  70*  --   --   ALT 118*  --   --  92*  --   --   ALKPHOS 193*  --   --  258*  --   --   BILITOT 1.6*  --   --  1.2  --   --   PROT 5.7*  --   --  5.5*  --   --   ALBUMIN 2.7*   2.8* 2.4* 2.2*  2.1*   2.1* 2.0* 1.9*    Cardiac Enzymes: No results for input(s): CKTOTAL, CKMB, CKMBINDEX, TROPONINI in the last 168 hours.  HbA1C: Hgb A1c MFr Bld  Date/Time Value Ref Range Status  05/05/2021 04:53 AM 6.7 (H) 4.8 - 5.6 % Final    Comment:    (NOTE)         Prediabetes: 5.7 - 6.4         Diabetes: >6.4         Glycemic control for adults with diabetes: <7.0   02/11/2021 09:41 AM 6.3 (H) <5.7 % of total Hgb Final  Comment:    For someone without known diabetes, a hemoglobin  A1c value between 5.7% and 6.4% is consistent with prediabetes and should be confirmed with a  follow-up test. . For someone with known diabetes, a value <7% indicates that their diabetes is well controlled. A1c targets should be individualized based on duration of diabetes, age, comorbid conditions, and other considerations. . This assay result is consistent with an increased risk of diabetes. . Currently, no consensus exists regarding use of hemoglobin A1c for diagnosis of diabetes for children. .     CBG: Recent Labs  Lab 06/12/21 0152 06/12/21 0311 06/12/21 0401 06/12/21 0606 06/12/21 0704  GLUCAP 77 72 73 70 72     Allergies No Known Allergies    Scheduled Meds:  amiodarone  200 mg Per Tube BID   vitamin C  500 mg Per Tube BID   chlorhexidine gluconate (MEDLINE KIT)  15 mL Mouth Rinse BID   Chlorhexidine Gluconate Cloth  6 each Topical Q0600   cholecalciferol  2,000 Units Per Tube Daily   collagenase   Topical Daily   Darbepoetin Alfa  100 mcg Subcutaneous Q7 days   escitalopram  10 mg Per Tube Daily   free water  30 mL Per Tube Q4H   insulin aspart  0-20 Units Subcutaneous Q4H   insulin aspart  8 Units Subcutaneous Q4H   insulin detemir  23 Units Subcutaneous BID   ipratropium-albuterol  3 mL Nebulization TID   lactobacillus  1 g Per Tube TID WC   lip balm   Topical TID   mouth rinse  15 mL Mouth Rinse 10 times per day   metoCLOPramide (REGLAN) injection  5 mg  Intravenous Q8H   midodrine  15 mg Per Tube TID WC   multivitamin  1 tablet Per Tube QHS   nutrition supplement (JUVEN)  1 packet Per Tube BID BM   pantoprazole sodium  40 mg Per Tube Daily   sodium chloride flush  10-40 mL Intracatheter Q12H   valACYclovir  500 mg Per Tube Daily   vitamin A  10,000 Units Per Tube Daily   vitamin E  400 Units Per Tube Daily   Continuous Infusions:  sodium chloride Stopped (06/03/21 0902)   sodium chloride Stopped (06/12/21 0011)   copper chloride IVPB Stopped (06/11/21 1914)   dextrose Stopped (06/12/21 0605)   feeding supplement (PIVOT 1.5 CAL) Stopped (06/11/21 1420)   norepinephrine (LEVOPHED) Adult infusion Stopped (06/10/21 0831)   PRN Meds:.sodium chloride, acetaminophen, albuterol, artificial tears, clonazePAM, HYDROmorphone (DILAUDID) injection, iohexol, loperamide, ondansetron (ZOFRAN) IV, oxyCODONE, sennosides, sodium chloride flush  Assessment & Plan:     Admitted for severe septic shock with  streptococcal toxic shock LLE NECROTIZING fascitis complicated by acute renal failure and hypoixc resp failure failure to wean from vent ended up with s/p TRACH and s/p PEG tube complicated with progressive end stage renal disease with dislodged PEG tube and 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    Ventilator Dependent Respiratory Failure s/p tracheostomy  Severe sepsis with streptococcal toxic shock was precipitating factor Weaned off of vent Hx: COPD, OSA, and Obesity TCT as tolerated - Continue trach collar - Intermittent chest x-ray & ABG PRN - Ensure adequate pulmonary hygiene     Intermittent atrial fibrillation with rvr~RESOLVED  - Continuous cardiac monitoring - Continue scheduled midodrine   - Continue amiodarone   Left necrotizing fascitis (strep pyogenes)-present on admission s/p multiple debridements, application of wound VAC system, s/p myriad matrix placement 05/17/2021 -myriad matrix failed  Streptococcal toxic shock syndrome Pseudomonas  in Tracheal wound culture 2/7  - Monitor fever curve - Trend WBC's, mild bump on leukocytosis today - Completed full course of abx therapy~02/17  - General Surgery following, appreciate input ~ wound care as per surgery - Vascular Surgery consulted for possible left AKA awaiting pts family decision regarding this FAMILY DOES NOT ANT AMPUTATION AT THIS TIME - Support left AKA - Leukocytosis may be related to ongoing issues with left lower extremity, however temporary left ij trialysis catheter removed 02/27 for line holiday due to worsening leukocytosis  Plan for  PERM cath and Central Line placement today   ACUTE KIDNEY INJURY/Renal Failure ATN now ESRD LAST STAGE OF KIDNEY FAILURE -continue Foley Catheter-assess need -Avoid nephrotoxic agents -Follow urine output, BMP -Ensure adequate renal perfusion, optimize oxygenation -Renal dose medications Plan for PERM CATH PLACEMENT TODAY   Intake/Output Summary (Last 24 hours) at 06/12/2021 0731 Last data filed at 06/12/2021 0600 Gross per 24 hour  Intake 2014.48 ml  Output 1200 ml  Net 814.48 ml    Shock liver with persistent transaminitis - Monitor hepatic function panel 2/27 - Monitor coags  - Avoid hepatotoxic drugs as able  Acute on chronic anemia~pt unable to tolerate chemical anticoagulation s/p IVC filter placement 05/29/2021 -Unable to tolerate anticoagulants or SCD's for VTE Prophylaxis ~ IVC filter placed 2/15 -Transfuse for Hgb <7   ENDO - ICU hypoglycemic\Hyperglycemia protocol -check FSBS per protocol Type II diabetes mellitus  -CBG's q4h; Target range of 140 to 180 -SSI -Follow ICU Hypo/Hyperglycemia protocol     ELECTROLYTES -follow labs as needed -replace as needed -pharmacy consultation and following  Toxic metabolic encephalopathy~improving  Minor SAH  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  NO ANTICOAGULATION FOR  1 MONTH AND THEN NEED TO RE-ASSESS NO ANTIPLATELET AGENTS FOR 1 MONTH AND  THEN NEED TO RE-ASSESS CAN POSSIBLY CONSIDER BABY ASA BUT WOULD NEED TO PROCEED WITH CAUTION -Continue Lexapro -Provide supportive care -Promote normal sleep/wake cycle -Promote family presence -Avoid sedating meds as able  GI Moderate malnutrition Sarcopenic obesity Debility/deconditioning Malpositioned PEG tube-Needs to be reinserted-GEN SURGERY ASSISTING WITH THIS HOLD ALL FEEDS WILL NEED CONTRASTED STUDY TO ASSESS FOR PANCREATIC MASS    Best practice:  Diet: Tube Feeds  Pain/Anxiety/Delirium protocol (if indicated): scheduled and prn oxycodone  VAP protocol (if indicated): Ordered  DVT prophylaxis: Unable to tolerate chemical prophylaxis s/p IVC filter placement  GI prophylaxis: PPI Glucose control:  SSI Yes Central venous access: N/A Arterial line:  N/A Foley:  N/A Mobility:  bed rest  PT consulted: N/A Last date of multidisciplinary goals of care discussion [06/11/21] Code Status:  full code Disposition: SDU   Prognosis is very poor To be discharged to [facility] once work up complete    Corrin Parker, M.D.  [facility]  Medical Director [place] Director [place] Department

## 2021-06-12 NOTE — Consult Note (Addendum)
Chief Complaint: Patient was seen in consultation today for displaced G-tube, AKI, poor venous access at the request of Dr. Mortimer Fries  Referring Physician(s): Dr. Mortimer Fries  Supervising Physician: Jacqulynn Cadet  Patient Status: [facility] - In-pt  History of Present Illness: Alexandra Foster is a 71 y.o. female with PMHx significant for COPD, DM, COVID-19 pneumonia who presented to ED with complaints of LLE pain and chills 1/21. Patient found to have cellulitis of LLE and septic shock with necrotizing fascitis requiring vasopressor support with acute kidney injury. She underwent surgical irrigation and debridement of LLE and tracheostomy and endoscopic g-tube placement, G-tube was placed 2/14. The patient was having episodes of emesis following G-tube placement and a CT was done that revealed gastrostomy tube retention balloon or button is outside the stomach and within the anterior abdominal wall. IR has been consulted today for removal of existing gastrostomy tube and replacement with new gastrostomy tube, image guided tunneled perm catheter for HD and non-tunneled central venous catheter placement for IV access for MRI contrast. History is obtained per chart review and from family as patient is unable to provide.   Past Medical History:  Diagnosis Date   AKI (acute kidney injury) ([place])    a. 04/2021 in setting of bacteremia/shock.   Arthritis    Asthma    Bacteremia    a. 04/2021 S pyogenes bacteremia in setting of lower ext cellulitis.   COPD (chronic obstructive pulmonary disease) (HCC)    Diabetes mellitus without complication (HCC)    Endometriosis    GERD (gastroesophageal reflux disease)    History of echocardiogram    a. 07/2013 Echo: EF 55-60%, impaired relaxation, mild TR; b. 04/2021 Echo: EF 50-55%, mild LVH, nl RV fxn, mild BAE, Ao sclerosis w/o stenosis.   Hypertension    Obesity    PAF (paroxysmal atrial fibrillation) ([place])    a. 04/2021 in setting of septic shock/cellulitis.    Sleep apnea    CPAP    Past Surgical History:  Procedure Laterality Date   ABDOMINAL HYSTERECTOMY     APPLICATION OF WOUND VAC  05/10/2021   Procedure: APPLICATION OF WOUND VAC;  Surgeon: Herbert Pun, MD;  Location: [facility];  Service: General;;   APPLICATION OF WOUND VAC Left 05/17/2021   Procedure: APPLICATION OF WOUND VAC/WOUND VAC EXCHANGE-Matrix Myriad;  Surgeon: Herbert Pun, MD;  Location: [facility];  Service: General;  Laterality: Left;   APPLICATION OF WOUND VAC  05/24/2021   Procedure: APPLICATION OF WOUND VAC;  Surgeon: Herbert Pun, MD;  Location: [facility];  Service: General;;   COLONOSCOPY  10/29/2006   Dr Allen Norris   COLONOSCOPY WITH PROPOFOL N/A 11/05/2016   Procedure: COLONOSCOPY WITH PROPOFOL;  Surgeon: Robert Bellow, MD;  Location: [facility];  Service: Endoscopy;  Laterality: N/A;   INCISION AND DRAINAGE OF WOUND Left 05/10/2021   Procedure: IRRIGATION AND DEBRIDEMENT LEFT LEG;  Surgeon: Herbert Pun, MD;  Location: [facility];  Service: General;  Laterality: Left;   INCISION AND DRAINAGE OF WOUND Left 05/24/2021   Procedure: IRRIGATION AND DEBRIDEMENT LEFT LEG;  Surgeon: Herbert Pun, MD;  Location: [facility];  Service: General;  Laterality: Left;   IVC FILTER INSERTION N/A 05/29/2021   Procedure: IVC FILTER INSERTION;  Surgeon: Katha Cabal, MD;  Location: [facility];  Service: Cardiovascular;  Laterality: N/A;   MINOR GRAFT APPLICATION  10/10/6379   Procedure: Myriad Matrix  APPLICATION;  Surgeon: Herbert Pun, MD;  Location: [facility];  Service: General;;  NASAL SINUS SURGERY  2002   Dr Carlis Abbott   PEG PLACEMENT N/A 05/28/2021   Procedure: PERCUTANEOUS ENDOSCOPIC GASTROSTOMY (PEG) PLACEMENT;  Surgeon: Benjamine Sprague, DO;  Location: [facility];  Service: General;  Laterality: N/A;  TRAVEL CASE   TRACHEOSTOMY TUBE PLACEMENT N/A 05/17/2021   Procedure: TRACHEOSTOMY;  Surgeon: Clyde Canterbury, MD;  Location:  [facility];  Service: ENT;  Laterality: N/A;   WOUND DEBRIDEMENT Left 05/07/2021   Procedure: DEBRIDEMENT WOUND;  Surgeon: Herbert Pun, MD;  Location: [facility];  Service: General;  Laterality: Left;    Allergies: Patient has no known allergies.  Medications: Prior to Admission medications   Medication Sig Start Date End Date Taking? Authorizing Provider  albuterol (PROVENTIL) (2.5 MG/3ML) 0.083% nebulizer solution Take 3 mLs (2.5 mg total) by nebulization every 6 (six) hours as needed for wheezing or shortness of breath. 10/03/19  Yes Sowles, Drue Stager, MD  aspirin 81 MG tablet Take 1 tablet (81 mg total) by mouth daily. 01/08/16  Yes Sowles, Drue Stager, MD  atorvastatin (LIPITOR) 40 MG tablet Take 1 tablet (40 mg total) by mouth daily. 02/11/21  Yes Sowles, Drue Stager, MD  azelastine (ASTELIN) 0.1 % nasal spray Place 2 sprays into both nostrils 2 (two) times daily as needed for rhinitis or allergies. 07/31/20  Yes Delsa Grana, PA-C  baclofen (LIORESAL) 10 MG tablet Take 10 mg by mouth daily. 04/16/21  Yes [provider]  celecoxib (CELEBREX) 200 MG capsule Take 200 mg by mouth daily. 04/03/21  Yes [provider]  Cetirizine HCl (ZYRTEC ALLERGY) 10 MG TBDP Take 10 mg by mouth at bedtime. For sinus symptoms and seasonal allergies 07/31/20  Yes Delsa Grana, PA-C  docusate sodium (COLACE) 100 MG capsule Take 100 mg by mouth daily.   Yes [provider]  fluticasone Asencion Islam) 50 MCG/ACT nasal spray  08/31/20  Yes [provider]  glucose blood (ACCU-CHEK AVIVA PLUS) test strip Use as instructed 12/08/17  Yes Sowles, Drue Stager, MD  Lancets (ACCU-CHEK SOFT TOUCH) lancets Use as instructed 12/08/17  Yes Sowles, Drue Stager, MD  meloxicam (MOBIC) 15 MG tablet Take 15 mg by mouth daily. 04/23/21  Yes [provider]  metFORMIN (GLUCOPHAGE-XR) 750 MG 24 hr tablet Take 1 tablet (750 mg total) by mouth daily with breakfast. 02/11/21  Yes Sowles, Drue Stager, MD  montelukast  (SINGULAIR) 10 MG tablet Take 1 tablet (10 mg total) by mouth at bedtime. 02/11/21  Yes Sowles, Drue Stager, MD  Multiple Vitamins-Minerals (MULTIVITAL PO) Take 1 tablet by mouth daily.   Yes [provider]  omeprazole (PRILOSEC) 20 MG capsule Take 1 capsule (20 mg total) by mouth daily. 02/11/21  Yes Steele Sizer, MD  Comfrey 30 MG/2ML SOSY  10/09/20  Yes [provider]  triamterene-hydrochlorothiazide (DYAZIDE) 37.5-25 MG capsule TAKE 1 CAPSULE EVERY DAY 05/02/21  Yes Sowles, Drue Stager, MD  vitamin B-12 (CYANOCOBALAMIN) 100 MCG tablet Take 100 mcg by mouth daily.   Yes [provider]  calcium carbonate (OSCAL) 1500 (600 Ca) MG TABS tablet Take 600 mg of elemental calcium by mouth 2 (two) times daily with a meal. Patient not taking: Reported on 05/05/2021    [provider]  Fluticasone-Umeclidin-Vilant (TRELEGY ELLIPTA) 100-62.5-25 MCG/INH AEPB Inhale 1 puff into the lungs daily. In place of Stiolto 02/10/20   Steele Sizer, MD  loratadine (CLARITIN) 10 MG tablet Take 1 tablet (10 mg total) by mouth daily. 02/11/21   Steele Sizer, MD     Family History  Problem Relation Age of Onset  Congestive Heart Failure Mother    Coronary artery disease Father 35   Breast cancer Sister 30   Heart disease Brother    Varicose Veins Brother    Alcohol abuse Brother     Social History   Socioeconomic History   Marital status: Married    Spouse name: Trilby Drummer   Number of children: 1   Years of education: 12   Highest education level: 12th grade  Occupational History    Employer: RETIRED  Tobacco Use   Smoking status: Former    Packs/day: 2.00    Years: 40.00    Pack years: 80.00    Types: Cigarettes    Quit date: 2003    Years since quitting: 20.1   Smokeless tobacco: Former    Types: Snuff    Quit date: 04/2001   Tobacco comments:    smoking cessation materials not required  Vaping Use   Vaping Use: Never used  Substance and Sexual Activity    Alcohol use: No    Alcohol/week: 0.0 standard drinks   Drug use: No   Sexual activity: Not Currently  Other Topics Concern   Not on file  Social History Narrative   Lives locally w/ husband.  Does not routinely exercise.   Social Determinants of Health   Financial Resource Strain: Medium Risk   Difficulty of Paying Living Expenses: Somewhat hard  Food Insecurity: No Food Insecurity   Worried About Charity fundraiser in the Last Year: Never true   Ran Out of Food in the Last Year: Never true  Transportation Needs: No Transportation Needs   Lack of Transportation (Medical): No   Lack of Transportation (Non-Medical): No  Physical Activity: Inactive   Days of Exercise per Week: 0 days   Minutes of Exercise per Session: 0 min  Stress: No Stress Concern Present   Feeling of Stress : Not at all  Social Connections: Moderately Integrated   Frequency of Communication with Friends and Family: More than three times a week   Frequency of Social Gatherings with Friends and Family: More than three times a week   Attends Religious Services: More than 4 times per year   Active Member of [employer] or Organizations: No   Attends Music therapist: Never   Marital Status: Married   Review of Systems: Patient unable to provide  Review of Systems  Vital Signs: BP (!) 98/48 (BP Location: Left Arm)    Pulse 79    Temp 99.5 F (37.5 C) (Axillary)    Resp 20    Ht '5\' 5"'  (1.651 m)    Wt 252 lb 6.8 oz (114.5 kg)    SpO2 95%    BMI 42.01 kg/m   Physical Exam General: Awake at times, appears ill, Tracheostomy intact  Abd: Soft, ND, G-tube intact without signs of drainage or skin breakdown  Heart: RRR Lungs: rhonchi present   Imaging: CT ABDOMEN PELVIS WO CONTRAST  Result Date: 06/11/2021 CLINICAL DATA:  Nausea, vomiting EXAM: CT ABDOMEN AND PELVIS WITHOUT CONTRAST TECHNIQUE: Multidetector CT imaging of the abdomen and pelvis was performed following the standard protocol without IV  contrast. RADIATION DOSE REDUCTION: This exam was performed according to the departmental dose-optimization program which includes automated exposure control, adjustment of the mA and/or kV according to patient size and/or use of iterative reconstruction technique. COMPARISON:  None. FINDINGS: Lower chest: Trace bilateral pleural effusions. Bilateral lower lobe airspace disease concerning for pneumonia. Hepatobiliary: Nodular contours of the liver compatible with  cirrhosis. Cysts 1.7 cm gallstone within the gallbladder. No biliary ductal dilatation. Pancreas: Concern for possible low-density lesion in the pancreatic head measuring 2.5 cm. This is difficult to visualized/evaluated on this noncontrast study. No ductal dilatation. Spleen: No focal abnormality.  Normal size. Adrenals/Urinary Tract: 6 mm nonobstructing stone in the midpole of the right kidney. No hydronephrosis or ureteral stones. Adrenal glands unremarkable. No renal mass. Urinary bladder unremarkable. Stomach/Bowel: Rectal tube in the rectum. Colonic diverticulosis. No active diverticulitis. Gastrostomy tube in place. The retention balloon or button is outside the stomach in the anterior abdominal wall. Stomach and small bowel decompressed, unremarkable. Normal appendix. Vascular/Lymphatic: Aortic atherosclerosis.  IVC filter in place. Reproductive: Prior hysterectomy.  No adnexal masses. Other: No free fluid or free air. Musculoskeletal: No acute bony abnormality. IMPRESSION: Changes of cirrhosis. Small bilateral pleural effusions. Bilateral lower lobe opacities concerning for pneumonia. Cholelithiasis. Colonic diverticulosis.  No active diverticulitis. Gastrostomy tube retention balloon or button is outside the stomach, within the anterior abdominal wall. Questionable low-density 2.5 cm mass in the pancreatic head. This is difficult to visualize without IV contrast. When the patient is clinically stable and able to follow directions and hold their  breath (preferably as an outpatient) further evaluation with dedicated abdominal MRI should be considered. Electronically Signed   By: Rolm Baptise M.D.   On: 06/11/2021 17:17   DG Chest 1 View  Result Date: 05/15/2021 CLINICAL DATA:  71 year old female with respiratory failure. Sepsis. EXAM: CHEST  1 VIEW COMPARISON:  Portable chest 05/11/2021 and earlier. FINDINGS: Portable AP supine view at 0609 hours. The patient remains mildly rotated to the right. Endotracheal tube tip in good position between the clavicles and carina. Stable left IJ approach dual lumen vascular catheter. Enteric tube and pH probe in place and appears stable. Right IJ central line tip now projects at the level the right atrium although lung volumes are slightly lower. Stable cardiac size and mediastinal contours. No pneumothorax. No pleural effusion or consolidation. Increased pulmonary vascularity since 05/10/2021. Paucity of bowel gas. No acute osseous abnormality identified. IMPRESSION: 1. Essentially stable lines and tubes. 2. Lower lung volumes with increased pulmonary vascularity. Consider mild or developing interstitial edema. No pleural effusion or consolidation. Electronically Signed   By: Genevie Ann M.D.   On: 05/15/2021 06:46   DG Abd 1 View  Result Date: 06/07/2021 CLINICAL DATA:  Vomiting, pain, fevers, chills EXAM: ABDOMEN - 1 VIEW COMPARISON:  05/20/2021 FINDINGS: No bowel dilatation to suggest obstruction. No evidence of pneumoperitoneum, portal venous gas or pneumatosis. No pathologic calcifications along the expected course of the ureters. Right nephrolithiasis. IVC filter with the tip at the level of L2. No acute osseous abnormality. IMPRESSION: No evidence of bowel obstruction. Electronically Signed   By: Kathreen Devoid M.D.   On: 06/07/2021 10:34   DG Abd 1 View  Result Date: 05/20/2021 CLINICAL DATA:  Vomiting EXAM: ABDOMEN - 1 VIEW COMPARISON:  Abdominal radiograph dated May 17, 2021 FINDINGS: NG tube tip and  side port within the stomach. Paucity of bowel gas somewhat limits evaluation. A few nondilated gas-filled loops of bowel are seen in the pelvis. IMPRESSION: Paucity of bowel gas somewhat limits evaluation. A few nondilated gas-filled loops of bowel are seen in the pelvis. Electronically Signed   By: Yetta Glassman M.D.   On: 05/20/2021 08:12   DG Abd 1 View  Result Date: 05/17/2021 CLINICAL DATA:  Nasogastric tube placement EXAM: ABDOMEN - 1 VIEW COMPARISON:  05/15/2021 FINDINGS: Nasogastric tube tip  is seen within the epigastric region overlying the expected mid body of the stomach. Small retained contrast likely within the gastric fundus. Central venous catheter tip noted within the superior right atrium. Retrocardiac atelectasis or infiltrate noted. The abdominal gas pattern is indeterminate due to a paucity of intra-abdominal gas. IMPRESSION: Nasogastric tube tip within the expected mid body of the stomach. Electronically Signed   By: Fidela Salisbury M.D.   On: 05/17/2021 19:37   DG Abd 1 View  Result Date: 05/15/2021 CLINICAL DATA:  71 year old female with respiratory failure. Sepsis. EXAM: ABDOMEN - 1 VIEW COMPARISON:  05/11/2021. FINDINGS: Portable AP supine views at 0603 hours. Enteric tube terminates in the gastric body. Side hole appears to be inside the stomach. There is mild to moderate gastric distention with air. A small volume of fundal oral contrast persists. Paucity of bowel gas elsewhere in the visible abdomen and pelvis. Left hemipelvis phlebolith. No acute osseous abnormality identified. IMPRESSION: 1. Satisfactory enteric tube termination in the stomach. Mild to moderate gastric distention with air. Small volume oral contrast suspected in the gastric fundus. 2. Paucity of bowel gas elsewhere in the abdomen and pelvis. Electronically Signed   By: Genevie Ann M.D.   On: 05/15/2021 06:47   CT HEAD WO CONTRAST (5MM)  Addendum Date: 05/14/2021   ADDENDUM REPORT: 05/14/2021 16:47 ADDENDUM:  These results will be called to the ordering clinician or representative by the Radiologist Assistant, and communication documented in the PACS or [employer]. Electronically Signed   By: Franchot Gallo M.D.   On: 05/14/2021 16:47   Result Date: 05/14/2021 CLINICAL DATA:  Subarachnoid hemorrhage.  Sepsis. EXAM: CT HEAD WITHOUT CONTRAST TECHNIQUE: Contiguous axial images were obtained from the base of the skull through the vertex without intravenous contrast. RADIATION DOSE REDUCTION: This exam was performed according to the departmental dose-optimization program which includes automated exposure control, adjustment of the mA and/or kV according to patient size and/or use of iterative reconstruction technique. COMPARISON:  MRI head 05/14/2021 FINDINGS: Brain: Hyperdensity in the left middle frontal sulcus. This corresponds to the FLAIR hyperintensity on MRI and is most consistent with small volume acute subarachnoid hemorrhage on the left. No other areas of high-density hemorrhage. Ventricle size normal.  Negative for acute infarct or mass. Empty sella with enlargement of the sella filled with CSF. Small pituitary. Vascular: Negative for hyperdense vessel Skull: Negative Sinuses/Orbits: Extensive mucosal edema paranasal sinuses. Probable air-fluid levels in the maxillary sinus bilaterally. Bilateral proptosis without orbital mass. Other: None IMPRESSION: Hyperintensity left middle frontal sulcus compatible with acute subarachnoid hemorrhage, small volume. No other acute intracranial abnormality. No hydrocephalus or infarct. Extensive paranasal sinus mucosal disease with air-fluid levels. Electronically Signed: By: Franchot Gallo M.D. On: 05/14/2021 15:59   MR ANGIO HEAD WO CONTRAST  Result Date: 05/15/2021 CLINICAL DATA:  Subarachnoid hemorrhage EXAM: MRA HEAD WITHOUT CONTRAST TECHNIQUE: Angiographic images of the Circle of Willis were acquired using MRA technique without intravenous contrast. COMPARISON:   No prior MRA, correlation is made with MRI 05/14/2021 FINDINGS: Anterior circulation: Both internal carotid arteries are patent to the termini, without significant stenosis. A1 segments patent. Normal anterior communicating artery. Anterior cerebral arteries are patent to their distal aspects. No M1 stenosis or occlusion. Normal MCA bifurcations. Distal MCA branches perfused and symmetric. Posterior circulation: Vertebral arteries patent to the vertebrobasilar junction without stenosis. Basilar patent to its distal aspect. Superior cerebellar arteries patent bilaterally. Patent P1 segments. PCAs perfused to their distal aspects without stenosis. The bilateral posterior communicating  arteries are patent. Anatomic variants: None significant Other: None. IMPRESSION: No intracranial large vessel occlusion or significant stenosis. No aneurysm or other vascular abnormality. Electronically Signed   By: Merilyn Baba M.D.   On: 05/15/2021 02:43   MR BRAIN WO CONTRAST  Result Date: 05/14/2021 CLINICAL DATA:  Altered mental status, septic shock secondary to left lower extremity cellulitis EXAM: MRI HEAD WITHOUT CONTRAST TECHNIQUE: Multiplanar, multiecho pulse sequences of the brain and surrounding structures were obtained without intravenous contrast. COMPARISON:  Brain MRI 08/10/2013 FINDINGS: Brain: There is no evidence of acute infarct. There is sulcal FLAIR hyperintensity with trace SWI signal dropout overlying a left frontal lobe sulcus and the right superior frontal sulcus (15-35, 15-39). There is no other evidence of acute intracranial hemorrhage or extra-axial fluid collection. Parenchymal volume is normal. The ventricles are normal in size. Parenchymal signal is normal, with no significant burden of white matter microangiopathic change. A partially empty sella is noted. There is no mass lesion. There is no midline shift. Vascular: Normal flow voids. Skull and upper cervical spine: Normal marrow signal.  Sinuses/Orbits: There is mucosal thickening throughout the paranasal sinuses with layering fluid in the maxillary sinuses and complete opacification of the mastoid air cells, likely at least partially related to intubation. The globes and orbits are unremarkable. Other: There is scalp fluid predominantly in the bilateral occipital regions. 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 IMPRESSION: 1. Sulcal FLAIR hyperintensity overlying bilateral frontal lobe sulci may be artifactual related to intubation; however, subarachnoid hemorrhage can have a similar appearance. Differential also includes meningitis. Recommend noncontrast CT for further evaluation for subarachnoid hemorrhage. Consider correlation with lumbar puncture to evaluate for meningitis as indicated, if the CT head is negative for hemorrhage. 2. Fluid in the bilateral occipital scalp. These results were called by telephone at the time of interpretation on 05/14/2021 at 3:10 pm to provider DANA GRAVES NP, who verbally acknowledged these results. Electronically Signed   By: Valetta Mole M.D.   On: 05/14/2021 15:11   PERIPHERAL VASCULAR CATHETERIZATION  Result Date: 05/29/2021 See surgical note for result.  DG Chest Port 1 View  Result Date: 06/10/2021 CLINICAL DATA:  Respiratory failure. EXAM: PORTABLE CHEST 1 VIEW COMPARISON:  06/01/2021. FINDINGS: 4:13 a.m., 06/10/2021. Double-lumen left IJ approach catheter again terminates in the SVC with interval removal of the prior right IJ line. The tip of the tracheostomy cannula is 3.3 cm from the carina. The patient is rotated to the right today. The heart is enlarged there is increased central vascular distension without overt edema findings. There is moderate increased layering left pleural fluid, development of a small right pleural effusion, and increased opacity in the left lower zone which could be atelectasis or consolidation. The remainder of the lungs are clear. There is no pneumothorax. In all  other respects no further changes. IMPRESSION: 1. Increased central vascular distension without overt edema findings. Stable cardiomegaly. 2. Worsening moderate layering left pleural fluid, small right pleural effusion and increased opacity in the left lower zone which could be atelectasis or consolidation. Electronically Signed   By: Telford Nab M.D.   On: 06/10/2021 06:39   DG Chest Port 1 View  Result Date: 06/01/2021 CLINICAL DATA:  Central line placement EXAM: PORTABLE CHEST 1 VIEW COMPARISON:  Radiograph 05/31/2021 FINDINGS: Tracheostomy tube overlies the midthoracic trachea. Right neck catheter tip overlies the right atrium. Left neck catheter tip overlies the region of the distal superior vena cava. Bibasilar opacities. No visible pneumothorax. No large effusion.  No acute osseous abnormality. IMPRESSION: Left neck central venous catheter tip overlies the distal SVC. Right neck catheter tip overlies the right atrium. Bibasilar opacities, likely atelectasis.  No visible pneumothorax. Electronically Signed   By: Maurine Simmering M.D.   On: 06/01/2021 15:43   DG Chest Port 1 View  Result Date: 05/31/2021 CLINICAL DATA:  Acute respiratory failure with hypoxia. EXAM: PORTABLE CHEST 1 VIEW COMPARISON:  May 23, 2021. FINDINGS: Stable cardiomediastinal silhouette. Tracheostomy tube is unchanged in position. Bilateral jugular catheters are unchanged in position. Increased left basilar opacity is noted concerning for atelectasis or infiltrate. Minimal right basilar subsegmental atelectasis is noted. Bony thorax is unremarkable. IMPRESSION: Increased left basilar opacity is noted concerning for atelectasis or infiltrate. Minimal right basilar subsegmental atelectasis. Electronically Signed   By: Marijo Conception M.D.   On: 05/31/2021 06:40   DG Chest Port 1 View  Result Date: 05/23/2021 CLINICAL DATA:  Acute respiratory failure EXAM: PORTABLE CHEST 1 VIEW COMPARISON:  Chest x-ray dated May 21, 2021  FINDINGS: Tracheostomy tube, enteric tube, right IJ line, and left IJ line are unchanged in position. Stable cardiac and mediastinal contours. Bibasilar atelectasis. No focal consolidation. No large pleural effusion or pneumothorax. IMPRESSION: Stable support devices.  No new parenchymal opacity. Electronically Signed   By: Yetta Glassman M.D.   On: 05/23/2021 08:28   DG Chest Port 1 View  Result Date: 05/21/2021 CLINICAL DATA:  Fever EXAM: PORTABLE CHEST 1 VIEW COMPARISON:  05/20/2021 FINDINGS: Tracheostomy in good position. NG tube in the stomach. Right jugular central venous catheter tip in the right atrium unchanged. Left jugular central venous catheter tip at the mid SVC unchanged. Progression of bilateral airspace disease left greater than right. Progression of small pleural effusions. Negative for pneumothorax. IMPRESSION: Support lines unchanged in position Progressive bilateral airspace disease. This likely represents fluid overload however could represent pneumonia. Electronically Signed   By: Franchot Gallo M.D.   On: 05/21/2021 17:39   DG Chest Port 1 View  Result Date: 05/20/2021 CLINICAL DATA:  Vomiting EXAM: PORTABLE CHEST 1 VIEW COMPARISON:  05/15/2021 chest radiograph. FINDINGS: Tracheostomy tube tip overlies the tracheal air column at the thoracic inlet. Right internal jugular central venous catheter terminates over the cavoatrial junction. Enteric tube enters stomach with the tip not seen on this image. Left internal jugular central venous catheter terminates in the upper third of the SVC. Stable cardiomediastinal silhouette with mild cardiomegaly. No pneumothorax. Trace bilateral pleural effusions, similar. Borderline mild pulmonary edema, improved. Improved lung volumes with residual mild left basilar atelectasis. IMPRESSION: 1. Mild position support structures.  No pneumothorax. 2. Borderline mild congestive heart failure, improved. 3. Trace bilateral pleural effusions, similar. 4.  Improved lung volumes with residual mild left basilar atelectasis. Electronically Signed   By: Ilona Sorrel M.D.   On: 05/20/2021 08:12   EEG adult  Result Date: 05/13/2021 Derek Jack, MD     05/13/2021  8:16 PM Routine EEG Report Ethie Curless Ferriss is a 71 y.o. female with a history of septic shock and encephalopathy who is undergoing an EEG to evaluate for seizures. Report: This EEG was acquired with electrodes placed according to the International 10-20 electrode system (including Fp1, Fp2, F3, F4, C3, C4, P3, P4, O1, O2, T3, T4, T5, T6, A1, A2, Fz, Cz, Pz). The following electrodes were missing or displaced: none. The best background was continuous at 3-4 Hz. This activity is reactive to stimulation. No sleep architecture was identified. There were frequent triphasic waves  that did not appear epileptiform. There was no focal slowing. There were no interictal epileptiform discharges. There were no electrographic seizures identified. Photic stimulation and hyperventilation were not performed. Impression and clinical correlation: This EEG was obtained while sedated on hydromorphone and is abnormal due to: - Severe diffuse slowing indicative of global cerebral dysfunction - Frequent triphasic waves indicative of metabolic encephalopathy Epileptiform abnormalities were not seen during this recording. Su Monks, MD [facility] [phone] If 7pm- 7am, please page neurology on call as listed in [place].   ECHOCARDIOGRAM COMPLETE  Result Date: 05/16/2021    ECHOCARDIOGRAM REPORT   Patient Name:   [facility] Date of Exam: 05/16/2021 Medical Rec #:  [id number]           Height:       65.0 in Accession #:    [id number]          Weight:       273.4 lb Date of Birth:  11-03-1950           BSA:          2.259 m Patient Age:    34 years            BP:           115/50 mmHg Patient Gender: F                   HR:           75 bpm. Exam Location:  [facility] Procedure: 2D Echo, Color Doppler and  Cardiac Doppler Indications:     R94.31 Abnormal ECG  History:         Patient has prior history of Echocardiogram examinations, most                  recent 05/06/2021. COPD; Risk Factors:Diabetes and Hypertension.  Sonographer:     Charmayne Sheer Referring Phys:  [id number] Flora Lipps Diagnosing Phys: Ida Rogue MD  Sonographer Comments: Echo performed with patient supine and on artificial respirator and Technically difficult study due to poor echo windows. Image acquisition challenging due to patient body habitus and Image acquisition challenging due to COPD. IMPRESSIONS  1. Left ventricular ejection fraction, by estimation, is 60 to 65%. The left ventricle has normal function. The left ventricle has no regional wall motion abnormalities. There is mild left ventricular hypertrophy. Left ventricular diastolic parameters are consistent with Grade I diastolic dysfunction (impaired relaxation).  2. Right ventricular systolic function is normal. The right ventricular size is normal. There is mildly elevated pulmonary artery systolic pressure. The estimated right ventricular systolic pressure is 22.0 mmHg.  3. The mitral valve is normal in structure. No evidence of mitral valve regurgitation. No evidence of mitral stenosis.  4. The aortic valve is normal in structure. Aortic valve regurgitation is not visualized. No aortic stenosis is present.  5. The inferior vena cava is normal in size with greater than 50% respiratory variability, suggesting right atrial pressure of 3 mmHg. FINDINGS  Left Ventricle: Left ventricular ejection fraction, by estimation, is 60 to 65%. The left ventricle has normal function. The left ventricle has no regional wall motion abnormalities. The left ventricular internal cavity size was normal in size. There is  mild left ventricular hypertrophy. Left ventricular diastolic parameters are consistent with Grade I diastolic dysfunction (impaired relaxation). Right Ventricle: The right ventricular size  is normal. No increase in right ventricular wall thickness. Right ventricular systolic function is normal. There is mildly elevated pulmonary  artery systolic pressure. The tricuspid regurgitant velocity is 3.16  m/s, and with an assumed right atrial pressure of 5 mmHg, the estimated right ventricular systolic pressure is 10.1 mmHg. Left Atrium: Left atrial size was normal in size. Right Atrium: Right atrial size was normal in size. Pericardium: There is no evidence of pericardial effusion. Mitral Valve: The mitral valve is normal in structure. No evidence of mitral valve regurgitation. No evidence of mitral valve stenosis. MV peak gradient, 3.7 mmHg. The mean mitral valve gradient is 2.0 mmHg. Tricuspid Valve: The tricuspid valve is normal in structure. Tricuspid valve regurgitation is mild . No evidence of tricuspid stenosis. Aortic Valve: The aortic valve is normal in structure. Aortic valve regurgitation is not visualized. No aortic stenosis is present. Aortic valve mean gradient measures 6.0 mmHg. Aortic valve peak gradient measures 9.9 mmHg. Aortic valve area, by VTI measures 1.95 cm. Pulmonic Valve: The pulmonic valve was normal in structure. Pulmonic valve regurgitation is mild. No evidence of pulmonic stenosis. Aorta: The aortic root is normal in size and structure. Venous: The inferior vena cava is normal in size with greater than 50% respiratory variability, suggesting right atrial pressure of 3 mmHg. IAS/Shunts: No atrial level shunt detected by color flow Doppler.  LEFT VENTRICLE PLAX 2D LVIDd:         4.57 cm   Diastology LVIDs:         3.74 cm   LV e' medial:    5.66 cm/s LV PW:         1.01 cm   LV E/e' medial:  10.6 LV IVS:        0.73 cm   LV e' lateral:   4.13 cm/s LVOT diam:     1.80 cm   LV E/e' lateral: 14.6 LV SV:         47 LV SV Index:   21 LVOT Area:     2.54 cm  LEFT ATRIUM           Index LA diam:      3.20 cm 1.42 cm/m LA Vol (A4C): 42.8 ml 18.95 ml/m  AORTIC VALVE                      PULMONIC VALVE AV Area (Vmax):    2.07 cm      PV Vmax:          1.32 m/s AV Area (Vmean):   1.83 cm      PV Vmean:         97.400 cm/s AV Area (VTI):     1.95 cm      PV VTI:           0.208 m AV Vmax:           157.00 cm/s   PV Peak grad:     7.0 mmHg AV Vmean:          108.000 cm/s  PV Mean grad:     4.0 mmHg AV VTI:            0.240 m       PR End Diast Vel: 6.66 msec AV Peak Grad:      9.9 mmHg AV Mean Grad:      6.0 mmHg LVOT Vmax:         128.00 cm/s LVOT Vmean:        77.800 cm/s LVOT VTI:          0.184 m LVOT/AV VTI  ratio: 0.77  AORTA Ao Root diam: 2.60 cm MITRAL VALVE               TRICUSPID VALVE MV Area (PHT): 3.27 cm    TR Peak grad:   39.9 mmHg MV Area VTI:   1.81 cm    TR Vmax:        316.00 cm/s MV Peak grad:  3.7 mmHg MV Mean grad:  2.0 mmHg    SHUNTS MV Vmax:       0.96 m/s    Systemic VTI:  0.18 m MV Vmean:      56.7 cm/s   Systemic Diam: 1.80 cm MV Decel Time: 232 msec MV E velocity: 60.10 cm/s MV A velocity: 67.90 cm/s MV E/A ratio:  0.89 Ida Rogue MD Electronically signed by Ida Rogue MD Signature Date/Time: 05/16/2021/3:38:58 PM    Final     Labs:  CBC: Recent Labs    06/09/21 0455 06/10/21 0508 06/11/21 0432 06/12/21 0414  WBC 14.6* 16.3* 14.1* 13.8*  HGB 7.8* 7.6* 7.6* 8.0*  HCT 24.9* 23.6* 24.5* 25.8*  PLT 282 290 285 316    COAGS: Recent Labs    05/05/21 0027 05/14/21 1056 05/16/21 0422 05/17/21 0350 05/22/21 0526 05/24/21 0335 05/28/21 1601  INR 1.3* 1.8* 1.3* 1.3* 1.2 1.1 1.2  APTT 36   33 45* 32  --   --   --   --     BMP: Recent Labs    06/09/21 0455 06/10/21 0508 06/11/21 0432 06/12/21 0414  NA 131* 132* 132* 131*  K 3.8 4.1 4.6 5.3*  CL 94* 92* 98 94*  CO2 '27 24 25 25  ' GLUCOSE 107* 113* 141* 75  BUN 70* 97* 136* 154*  CALCIUM 8.7* 8.9 8.8* 9.1  CREATININE 2.06* 2.81* 3.14* 3.40*  GFRNONAA 25* 17* 15* 14*    LIVER FUNCTION TESTS: Recent Labs    05/22/21 1104 05/22/21 1615 05/31/21 0306 05/31/21 1624 06/07/21 0421  06/08/21 0430 06/09/21 0455 06/10/21 0508 06/11/21 0432 06/12/21 0414  BILITOT 2.8*  --  1.6*  --  1.6*  --   --  1.2  --   --   AST 102*  --  75*  --  81*  --   --  70*  --   --   ALT 138*  --  104*  --  118*  --   --  92*  --   --   ALKPHOS 323*  --  206*  --  193*  --   --  258*  --   --   PROT 6.3*  --  5.8*  --  5.7*  --   --  5.5*  --   --   ALBUMIN 2.4*   < > 2.5*   2.4*   < > 2.7*   2.8*   < > 2.2* 2.1*   2.1* 2.0* 1.9*   < > = values in this interval not displayed.    Assessment and Plan: This is a 71 year old female with PMHx significant for COPD, DM, COVID-19 pneumonia who presented to ED with complaints of LLE pain and chills 1/21. Patient found to have cellulitis of LLE and septic shock with necrotizing fascitis requiring vasopressor support with acute kidney injury. She underwent surgical irrigation and debridement of LLE and tracheostomy and endoscopic g-tube placement, G-tube was placed 2/14. The patient was having episodes of emesis following G-tube placement and a CT was done that revealed gastrostomy tube retention  balloon or button is outside the stomach and within the anterior abdominal wall. IR has been consulted today for removal of existing gastrostomy tube and replacement with new gastrostomy tube, image guided tunneled perm catheter for HD and non-tunneled central venous catheter placement for IV access for MRI contrast.   The patient has been NPO, imaging, labs and vitals have been reviewed. Spoke to primary team and no concern for current infection, afebrile and off vasopressor support.  Risks and benefits discussed with the patient's family today including, but not limited to bleeding, infection, vascular injury, damage to adjacent structures and inability to successfully remove G-tube or place new G-tube.  All of the questions were answered, patient's family and POA is agreeable to proceed.  Consent signed and in chart.  Thank you for this interesting consult.   I greatly enjoyed meeting [employer] and look forward to participating in their care.  A copy of this report was sent to the requesting provider on this date.  Electronically Signed: Hedy Jacob, PA-C 06/12/2021, 2:03 PM   I spent a total of 20 Minutes in face to face in clinical consultation, greater than 50% of which was counseling/coordinating care for displaced gastrostomy tube, acute kidney injury and poor venous access.

## 2021-06-12 NOTE — Plan of Care (Signed)
?  Problem: Education: ?Goal: Knowledge of General Education information will improve ?Description: Including pain rating scale, medication(s)/side effects and non-pharmacologic comfort measures ?Outcome: Progressing ?  ?Problem: Clinical Measurements: ?Goal: Will remain free from infection ?Outcome: Progressing ?  ?Problem: Pain Managment: ?Goal: General experience of comfort will improve ?Outcome: Progressing ?  ?Problem: Role Relationship: ?Goal: Method of communication will improve ?Outcome: Progressing ?  ?Problem: Education: ?Goal: Knowledge of disease and its progression will improve ?Outcome: Progressing ?  ?Problem: Clinical Measurements: ?Goal: Complications related to the disease process or treatment will be avoided or minimized ?Outcome: Progressing ?Goal: Dialysis access will remain free of complications ?Outcome: Progressing ?  ?Problem: Fluid Volume: ?Goal: Fluid volume balance will be maintained or improved ?Outcome: Progressing ?  ?Problem: Urinary Elimination: ?Goal: Progression of disease will be identified and treated ?Outcome: Progressing ?  ?Problem: Education: ?Goal: Knowledge of disease or condition will improve ?Outcome: Progressing ?Goal: Knowledge of secondary prevention will improve (SELECT ALL) ?Outcome: Progressing ?Goal: Knowledge of patient specific risk factors will improve (INDIVIDUALIZE FOR PATIENT) ?Outcome: Progressing ?Goal: Individualized Educational Video(s) ?Outcome: Progressing ?  ?Problem: Coping: ?Goal: Will verbalize positive feelings about self ?Outcome: Progressing ?Goal: Will identify appropriate support needs ?Outcome: Progressing ?  ?Problem: Health Behavior/Discharge Planning: ?Goal: Ability to manage health-related needs will improve ?Outcome: Progressing ?  ?Problem: Spontaneous Subarachnoid Hemorrhage Tissue Perfusion: ?Goal: Complications of Spontaneous Subarachnoid Hemorrhage will be minimized ?Outcome: Progressing ?  ?

## 2021-06-12 NOTE — Progress Notes (Signed)
[facility]  PROGRESS NOTE   Subjective:   Following commands.   UOP 735m  K 5.3, Creatinine 3.4, BUN 154  Wbc 13.8 (14.1)  Afebrile  midodrine  Objective:  Vital signs in last 24 hours:  Temp:  [97.3 F (36.3 C)-99.5 F (37.5 C)] 99.5 F (37.5 C) (03/01 0737) Pulse Rate:  [76-84] 80 (03/01 0758) Resp:  [19-24] 20 (03/01 0758) BP: (100-107)/(42-54) 100/54 (03/01 0600) SpO2:  [95 %-99 %] 98 % (03/01 0758) FiO2 (%):  [28 %] 28 % (03/01 0758) Weight:  [114.5 kg] 114.5 kg (03/01 0500)  Weight change: -0.3 kg Filed Weights   06/10/21 0500 06/11/21 0500 06/12/21 0500  Weight: 111.8 kg 114.8 kg 114.5 kg    Intake/Output: I/O last 3 completed shifts: In: 2808.3 [I.V.:431.8; Other:780; NG/GT:1501.7; IV Piggyback:94.8] Out: 1450 [Urine:700; Drains:550; SSEGBT:517]  Intake/Output this shift:  No intake/output data recorded.  Physical Exam: General:  Critically ill  Head:  [place]/AT, OM moist  Eyes:  Anicteric  Neck:  tracheostomy in place  Lungs:  Scattered rhonchi, normal effort  Heart:  regular  Abdomen:   Soft, nontender  Extremities:  Left lower extremity wound vac 1+ right lower extremity edema  Neurologic:  Awake, alert, following commands  Skin:  No lesions  Access:  None    Basic Metabolic Panel: Recent Labs  Lab 06/08/21 0430 06/09/21 0455 06/10/21 0508 06/11/21 0432 06/12/21 0414  NA 130* 131* 132* 132* 131*  K 5.2* 3.8 4.1 4.6 5.3*  CL 90* 94* 92* 98 94*  CO2 _0 GLUCOSE 102* 107* 113* 141* 75  BUN 100* 70* 97* 136* 154*  CREATININE 2.85* 2.06* 2.81* 3.14* 3.40*  CALCIUM 9.1 8.7* 8.9 8.8* 9.1  MG 2.2 2.0 2.4 2.5* 2.7*  PHOS 6.9* 4.6 4.5 3.6 4.2     CBC: Recent Labs  Lab 06/08/21 0430 06/09/21 0455 06/10/21 0508 06/11/21 0432 06/12/21 0414  WBC 12.3* 14.6* 16.3* 14.1* 13.8*  NEUTROABS 8.9* 11.0* 12.9* 11.5* 10.5*  HGB 7.8* 7.8* 7.6* 7.6* 8.0*  HCT 24.2* 24.9* 23.6* 24.5* 25.8*  MCV 87.4 88.6 88.4 90.1  88.7  PLT 259 282 290 285 316      Urinalysis: No results for input(s): COLORURINE, LABSPEC, PHURINE, GLUCOSEU, HGBUR, BILIRUBINUR, KETONESUR, PROTEINUR, UROBILINOGEN, NITRITE, LEUKOCYTESUR in the last 72 hours.  Invalid input(s): APPERANCEUR    Imaging: CT ABDOMEN PELVIS WO CONTRAST  Result Date: 06/11/2021 CLINICAL DATA:  Nausea, vomiting EXAM: CT ABDOMEN AND PELVIS WITHOUT CONTRAST TECHNIQUE: Multidetector CT imaging of the abdomen and pelvis was performed following the standard protocol without IV contrast. RADIATION DOSE REDUCTION: This exam was performed according to the departmental dose-optimization program which includes automated exposure control, adjustment of the mA and/or kV according to patient size and/or use of iterative reconstruction technique. COMPARISON:  None. FINDINGS: Lower chest: Trace bilateral pleural effusions. Bilateral lower lobe airspace disease concerning for pneumonia. Hepatobiliary: Nodular contours of the liver compatible with cirrhosis. Cysts 1.7 cm gallstone within the gallbladder. No biliary ductal dilatation. Pancreas: Concern for possible low-density lesion in the pancreatic head measuring 2.5 cm. This is difficult to visualized/evaluated on this noncontrast study. No ductal dilatation. Spleen: No focal abnormality.  Normal size. Adrenals/Urinary Tract: 6 mm nonobstructing stone in the midpole of the right kidney. No hydronephrosis or ureteral stones. Adrenal glands unremarkable. No renal mass. Urinary bladder unremarkable. Stomach/Bowel: Rectal tube in the rectum. Colonic diverticulosis. No active diverticulitis. Gastrostomy tube in place. The retention balloon or button  is outside the stomach in the anterior abdominal wall. Stomach and small bowel decompressed, unremarkable. Normal appendix. Vascular/Lymphatic: Aortic atherosclerosis.  IVC filter in place. Reproductive: Prior hysterectomy.  No adnexal masses. Other: No free fluid or free air. Musculoskeletal:  No acute bony abnormality. IMPRESSION: Changes of cirrhosis. Small bilateral pleural effusions. Bilateral lower lobe opacities concerning for pneumonia. Cholelithiasis. Colonic diverticulosis.  No active diverticulitis. Gastrostomy tube retention balloon or button is outside the stomach, within the anterior abdominal wall. Questionable low-density 2.5 cm mass in the pancreatic head. This is difficult to visualize without IV contrast. When the patient is clinically stable and able to follow directions and hold their breath (preferably as an outpatient) further evaluation with dedicated abdominal MRI should be considered. Electronically Signed   By: Rolm Baptise M.D.   On: 06/11/2021 17:17     Medications:    sodium chloride Stopped (06/03/21 0902)   sodium chloride Stopped (06/12/21 0011)   copper chloride IVPB 2 mg (06/12/21 0903)   dextrose 30 mL/hr at 06/12/21 0700   feeding supplement (PIVOT 1.5 CAL) Stopped (06/11/21 1420)   norepinephrine (LEVOPHED) Adult infusion Stopped (06/10/21 0831)    amiodarone  200 mg Per Tube BID   vitamin C  500 mg Per Tube BID   chlorhexidine gluconate (MEDLINE KIT)  15 mL Mouth Rinse BID   Chlorhexidine Gluconate Cloth  6 each Topical Q0600   cholecalciferol  2,000 Units Per Tube Daily   collagenase   Topical Daily   Darbepoetin Alfa  100 mcg Subcutaneous Q7 days   escitalopram  10 mg Per Tube Daily   free water  30 mL Per Tube Q4H   insulin aspart  0-20 Units Subcutaneous Q4H   insulin aspart  8 Units Subcutaneous Q4H   insulin detemir  23 Units Subcutaneous BID   ipratropium-albuterol  3 mL Nebulization TID   lactobacillus  1 g Per Tube TID WC   lip balm   Topical TID   mouth rinse  15 mL Mouth Rinse 10 times per day   metoCLOPramide (REGLAN) injection  5 mg Intravenous Q8H   midodrine  15 mg Per Tube TID WC   multivitamin  1 tablet Per Tube QHS   nutrition supplement (JUVEN)  1 packet Per Tube BID BM   pantoprazole sodium  40 mg Per Tube Daily    sodium chloride flush  10-40 mL Intracatheter Q12H   valACYclovir  500 mg Per Tube Daily   vitamin A  10,000 Units Per Tube Daily   vitamin E  400 Units Per Tube Daily    Assessment/ Plan:     Principal Problem:   Severe sepsis with septic shock (CODE) (HCC) Active Problems:   COPD (chronic obstructive pulmonary disease) (HCC)   Morbid obesity (HCC)   Diabetes mellitus type 2 in obese (HCC)   Atrial fibrillation and flutter ([place])   Necrotizing fasciitis ([place])   Acute and chronic respiratory failure (acute-on-chronic) ([place])   On mechanically assisted ventilation (HCC)   Shock liver   Anemia associated with acute blood loss   Metabolic acidosis   Encounter for continuous renal replacement therapy (CRRT) for acute renal failure (HCC)   Acute encephalopathy   Group A streptococcal infection   Streptococcal toxic shock syndrome ([place])   Status post tracheostomy ([place])   Subdural hemorrhage (HCC)   Left leg cellulitis   Abdominal distension  [name] is a 71 y.o. black female with COPD/asthma, diabetes mellitus type II, hypertension, obstructive sleep  apnea, paroxysmal atrial fibrillation, GERD, who presents to [facility] on 05/04/2021 for AKI (acute kidney injury) ([place]) [N17.9] Left leg cellulitis [L03.116] Severe sepsis ([place]) [A41.9, R65.20] Severe sepsis with lactic acidosis ([place]) [A41.9, R65.20, E87.20] Found to have necrotizing fascitis requiring multiple debridements by Dr. Peyton Najjar: 1/24. 1/27, 2/3 and 2/10.    #1: Acute kidney injury: AKI is secondary to ischemic nephropathy secondary to hypotension complicated with sepsis. History of bland urine. Baseline creatinine of 0.98, GFR > 60 on 02/11/21.  Patient was on CRRT for prolonged period of time.  Transitioned to hemodialysis 06/04/2021. Plan on dialysis catheter placement and hemodialysis for today.    #2: Sepsis and hypotension: necrotizing fasciitis for left lower extremity status post multiple debridement.  Requiring  vasopressors: norepinephrine. On stress dose steroids and midodrine -  Patient remains hemodynamically fragile, required pressors during dialysis .       - MIdodrine  #3. Anemia with Acute kidney injury:  Lab Results  Component Value Date   HGB 8.0 (L) 06/12/2021    S/p multiple blood transfusions this admission Aranesp weekly SQ (mon) Continues to have blood loss through the wound VAC.     #4. Acute Respiratory Failure. Status post tracheostomy.  Continues to do well off the ventilator      LOS: [place], MD [facility] 3/1/20239:04 AM

## 2021-06-12 NOTE — TOC Progression Note (Signed)
Transition of Care (TOC) - Progression Note  ? ? ?Patient Details  ?Name: Alyssia Heese Marano ?MRN: [id number] ?Date of Birth: 1950-12-11 ? ?Transition of Care (TOC) CM/SW Contact  ?Shelbie Hutching, RN ?Phone Number: ?06/12/2021, 12:34 PM ? ?Clinical Narrative:    ?Raquel Sarna with [facility] started insurance authorization yesterday, auth pending.  ? ? ?Expected Discharge Plan: [place] ?Barriers to Discharge: Continued Medical Work up ? ?Expected Discharge Plan and Services ?Expected Discharge Plan: [place] ?  ?Discharge Planning Services: CM Consult ?  ?Living arrangements for the past 2 months: [place] ?                ?  ?  ?  ?  ?  ?  ?  ?  ?  ?  ? ? ?Social Determinants of Health (SDOH) Interventions ?  ? ?Readmission Risk Interventions ?No flowsheet data found. ? ?

## 2021-06-12 NOTE — Consult Note (Signed)
PHARMACY CONSULT NOTE ? ?Pharmacy Consult for Electrolyte Monitoring and Replacement  ? ?Recent Labs: ?Potassium (mmol/L)  ?Date Value  ?06/12/2021 5.3 (H)  ?08/11/2013 3.3 (L)  ? ?Magnesium (mg/dL)  ?Date Value  ?06/12/2021 2.7 (H)  ? ?Calcium (mg/dL)  ?Date Value  ?06/12/2021 9.1  ? ?Calcium, Total (mg/dL)  ?Date Value  ?08/11/2013 9.2  ? ?Albumin (g/dL)  ?Date Value  ?06/12/2021 1.9 (L)  ?08/11/2013 3.1 (L)  ? ?Phosphorus (mg/dL)  ?Date Value  ?06/12/2021 4.2  ? ?Sodium (mmol/L)  ?Date Value  ?06/12/2021 131 (L)  ?08/11/2013 138  ? ?Assessment: ?71 yo female with a previous medical history of diabetes mellitus type 2, hypertension, obstructive sleep apnea on CPAP, asthma/COPD, GERD, history of COVID-19 pneumonia presenting with lower left extremity pain and chills. Patient found to have severe sepsis and strep pyogenes bacteremia on admission and is currently intubated and sedated. LLL necrotizing fascitis. [facility] has been consulted to monitor and replace electrolytes.  ? ?-CRRT stopped 2/6, re-started (2/8>> 2/20) ?-s/p trach 2/3, tolerating trach collar trials ?--receiving iHD nephrology ? ?Nutrition:  ?--Pivot per tube at 65 mL/hr ?--Free water per tube 30 mL q4h ? ?Goal of Therapy:  ?Electrolytes within normal limits ? ?Plan:  ?Nephrology planning w/ Vasc surgery to place HD perm-cath on 3/01 w/ HD session on 3/01 as well ? ?No electrolyte replacement warranted for today ?Expect to improve w/ HD today. Na: 131>132; K: 3.8>4.1>4.6>5.3; Scr 2.06>2.81>3.14>3.4 (BL 0.98 PTA) ?Mild stable hyponatremia, Fluid balance / adjustment as indicated per [facility] / nephrology ?Renal function panel, Mag ordered daily ? ?Lorna Dibble, PharmD, BCCP ?Clinical Pharmacist ?06/12/2021 11:40 AM ? ?

## 2021-06-12 NOTE — Procedures (Signed)
Interventional Radiology Procedure Note ? ?Procedure:  ?1.) Placement of a right IJ 19 cm tunneled HD catheter, Tips in the RA and ready for use.  ?2.) Placement of a right IJ triple lumen CVC.  Catheter tip in the RA. ?3.) Exchange of malpositioned g-tube for new 57F g-tube.  Ready for use.  ? ?Complications: None ? ?Estimated Blood Loss: None ? ?Recommendations: ?- Routine line care ?- May resume use of g-tube ? ? ? ?Signed, ? ?Criselda Peaches, MD ? ? ?

## 2021-06-12 NOTE — Progress Notes (Signed)
Occupational Therapy Treatment ?Patient Details ?Name: Alexandra Foster ?MRN: [id number] ?DOB: Feb 23, 1951 ?Today's Date: 06/12/2021 ? ? ?History of present illness 71 y/o female with history of Asthma/COPD, T2DM, HTN, OSA on CPAP, GERD, COVID-19 pneumonia, and Bilateral lower extremity edema who presented to the ED with chief complaint of LLE pain.  Cellulitis of LLE, septic shock with LLL necrotizing fascitis post op resp failure and severe septic shock with progressive renal failure on CRRT and progressive multiorgan failure.  Pt needing vent support on trach at night, HD, tube feeds. Pt is now s/p I&D of LLE & wound vac application on 08/21/30. Per secure chat with nephrologist it is okay to see pt while on CRRT. Surgeon also clears pt for participation with WBAT LLE. ?  ?OT comments ? Ms Thorman was seen for OT treatment on this date. Upon arrival to room pt reclined in bed, new family member at bedside agreeable to HEP education. Family member instructed on importance of A/PROM with BUE and R ankle at bed level to prevent contractures. Instructed on positioning to avoid neck contractures. Pt tolerated therex as described below. Pt raises BUE to chin level with no assist however shakes head when asked to participate in grooming tasks - pt demonstrates increased alertness this date compared to prior sessions. Will continue to trial OT however plan to attempt grooming tasks next session. Pt continues to benefit from skilled OT services to maximize return to PLOF and minimize risk of future falls, injury, caregiver burden, and readmission. Will continue to follow POC. Discharge recommendation remains appropriate.  ?  ? ?Recommendations for follow up therapy are one component of a multi-disciplinary discharge planning process, led by the attending physician.  Recommendations may be updated based on patient status, additional functional criteria and insurance authorization. ?   ?Follow Up Recommendations ? OT at  Long-term acute care hospital  ?  ?Assistance Recommended at Discharge Frequent or constant Supervision/Assistance  ?Patient can return home with the following ? Two people to help with bathing/dressing/bathroom;Two people to help with walking and/or transfers;Assistance with cooking/housework;Assist for transportation;Help with stairs or ramp for entrance;Assistance with feeding;Direct supervision/assist for financial management;Direct supervision/assist for medications management ?  ?Equipment Recommendations ? Other (comment) (defer to next venue of care)  ?  ?Recommendations for Other Services   ? ?  ?Precautions / Restrictions Precautions ?Precautions: Fall ?Precaution Comments: L jugular temp dialysis catheter, wounds under most skin folds, LLE wound vac, ventilator via tracheostomy, rectal tube, catheter ?Restrictions ?Weight Bearing Restrictions: Yes ?LLE Weight Bearing: Weight bearing as tolerated  ? ? ?  ? ?Mobility Bed Mobility ?  ?  ?  ?  ?  ?  ?  ?General bed mobility comments: not attempted, per RN pt repeatedly nauseated/vommitting with plans for new PEG tube, pt deferred at this time ?  ? ? ?  ?  ?  ?   ? ?ADL either performed or assessed with clinical judgement  ? ?ADL Overall ADL's : Needs assistance/impaired ?  ?  ?  ?  ?  ?  ?  ?  ?  ?  ?  ?  ?  ?  ?  ?  ?  ?  ?  ?General ADL Comments: Raises BUE to chin level with no assist however shakes head when asked to participate in grooming tasks. ?  ? ? ? ?Cognition Arousal/Alertness: Awake/alert ?Behavior During Therapy: Flat affect ?Overall Cognitive Status: Difficult to assess ?  ?  ?  ?  ?  ?  ?  ?  ?  ?  ?  ?  ?  ?  ?  ?  ?  ?  ?  ?   ?  Exercises Exercises: General Upper Extremity, General Lower Extremity ?General Exercises - Upper Extremity ?Shoulder Flexion: AROM, Strengthening, Both, 5 reps, Supine ?Shoulder ABduction: AROM, Strengthening, Both, 5 reps, Supine ?Elbow Flexion: AROM, Strengthening, Both, 5 reps, Supine ?Elbow Extension: AROM,  Strengthening, Both, 5 reps, Supine ?Digit Composite Flexion: AROM, Strengthening, Both, 5 reps, Supine ?General Exercises - Lower Extremity ?Ankle Circles/Pumps: AROM, Strengthening, Both, 5 reps, Supine ? ?  ?   ?   ? ? ?Pertinent Vitals/ Pain       Pain Assessment ?Pain Assessment: Faces ?Faces Pain Scale: Hurts little more ?Pain Location: RLE PROM ?Pain Descriptors / Indicators: Grimacing ?Pain Intervention(s): Limited activity within patient's tolerance, Repositioned ? ?   ?   ? ?Frequency ? Min 1X/week  ? ? ? ? ?  ?Progress Toward Goals ? ?OT Goals(current goals can now be found in the care plan section) ? Progress towards OT goals: Progressing toward goals ? ?Acute Rehab OT Goals ?Patient Stated Goal: to be warm ?OT Goal Formulation: With patient/family ?Time For Goal Achievement: 06/21/21 ?Potential to Achieve Goals: Fair ?ADL Goals ?Pt Will Perform Grooming: bed level;with mod assist ?Pt/caregiver will Perform Home Exercise Program: Increased strength;Both right and left upper extremity;With written HEP provided ?Additional ADL Goal #1: Pt will participate in bed mobility requiring MAX A +1 person for bed level ADL and repositioning.  ?Plan Discharge plan remains appropriate;Frequency remains appropriate   ? ?Co-evaluation ? ? ?   ?  ?  ?  ?  ? ?  ?AM-PAC OT "6 Clicks" Daily Activity     ?Outcome Measure ? ? Help from another person eating meals?: A Lot ?Help from another person taking care of personal grooming?: A Lot ?Help from another person toileting, which includes using toliet, bedpan, or urinal?: Total ?Help from another person bathing (including washing, rinsing, drying)?: Total ?Help from another person to put on and taking off regular upper body clothing?: Total ?Help from another person to put on and taking off regular lower body clothing?: Total ?6 Click Score: 8 ? ?  ?End of Session   ? ?OT Visit Diagnosis: Other abnormalities of gait and mobility (R26.89);Muscle weakness (generalized)  (M62.81);Pain ?Pain - Right/Left: Left ?Pain - part of body: Leg ?  ?Activity Tolerance Patient limited by fatigue ?  ?Patient Left in bed;with call bell/phone within reach;with family/visitor present ?  ?Nurse Communication   ?  ? ?   ? ?Time: 2683-4196 ?OT Time Calculation (min): 8 min ? ?Charges: OT General Charges ?$OT Visit: 1 Visit ?OT Treatments ?$Therapeutic Exercise: 8-22 mins ? ?Dessie Coma, M.S. OTR/L  ?06/12/21, 11:42 AM  ?ascom [phone] ? ?

## 2021-06-13 ENCOUNTER — Ambulatory Visit (HOSPITAL_COMMUNITY)
Admission: AD | Admit: 2021-06-13 | Discharge: 2021-06-13 | Disposition: A | Discharge status: Home / Self Care | Payer: Medicare HMO | Source: Other Acute Inpatient Hospital | Attending: Internal Medicine | Admitting: Internal Medicine

## 2021-06-13 DIAGNOSIS — G9341 Metabolic encephalopathy: Secondary | ICD-10-CM | POA: Diagnosis not present

## 2021-06-13 DIAGNOSIS — J398 Other specified diseases of upper respiratory tract: Secondary | ICD-10-CM | POA: Diagnosis not present

## 2021-06-13 DIAGNOSIS — L89153 Pressure ulcer of sacral region, stage 3: Secondary | ICD-10-CM | POA: Diagnosis not present

## 2021-06-13 DIAGNOSIS — A419 Sepsis, unspecified organism: Secondary | ICD-10-CM | POA: Insufficient documentation

## 2021-06-13 DIAGNOSIS — J151 Pneumonia due to Pseudomonas: Secondary | ICD-10-CM | POA: Diagnosis not present

## 2021-06-13 DIAGNOSIS — R1312 Dysphagia, oropharyngeal phase: Secondary | ICD-10-CM | POA: Diagnosis not present

## 2021-06-13 DIAGNOSIS — N2581 Secondary hyperparathyroidism of renal origin: Secondary | ICD-10-CM | POA: Diagnosis not present

## 2021-06-13 DIAGNOSIS — L03116 Cellulitis of left lower limb: Secondary | ICD-10-CM | POA: Diagnosis not present

## 2021-06-13 DIAGNOSIS — R2689 Other abnormalities of gait and mobility: Secondary | ICD-10-CM | POA: Diagnosis not present

## 2021-06-13 DIAGNOSIS — N179 Acute kidney failure, unspecified: Secondary | ICD-10-CM | POA: Diagnosis not present

## 2021-06-13 DIAGNOSIS — R110 Nausea: Secondary | ICD-10-CM | POA: Diagnosis not present

## 2021-06-13 DIAGNOSIS — R109 Unspecified abdominal pain: Secondary | ICD-10-CM | POA: Diagnosis not present

## 2021-06-13 DIAGNOSIS — T8189XA Other complications of procedures, not elsewhere classified, initial encounter: Secondary | ICD-10-CM | POA: Diagnosis not present

## 2021-06-13 DIAGNOSIS — Z9981 Dependence on supplemental oxygen: Secondary | ICD-10-CM | POA: Diagnosis not present

## 2021-06-13 DIAGNOSIS — I959 Hypotension, unspecified: Secondary | ICD-10-CM | POA: Diagnosis not present

## 2021-06-13 DIAGNOSIS — G7281 Critical illness myopathy: Secondary | ICD-10-CM | POA: Diagnosis not present

## 2021-06-13 DIAGNOSIS — J9621 Acute and chronic respiratory failure with hypoxia: Secondary | ICD-10-CM | POA: Diagnosis not present

## 2021-06-13 DIAGNOSIS — F331 Major depressive disorder, recurrent, moderate: Secondary | ICD-10-CM | POA: Diagnosis not present

## 2021-06-13 DIAGNOSIS — R5381 Other malaise: Secondary | ICD-10-CM | POA: Diagnosis not present

## 2021-06-13 DIAGNOSIS — E119 Type 2 diabetes mellitus without complications: Secondary | ICD-10-CM | POA: Diagnosis not present

## 2021-06-13 DIAGNOSIS — R0902 Hypoxemia: Secondary | ICD-10-CM | POA: Diagnosis not present

## 2021-06-13 DIAGNOSIS — F411 Generalized anxiety disorder: Secondary | ICD-10-CM | POA: Diagnosis not present

## 2021-06-13 DIAGNOSIS — M726 Necrotizing fasciitis: Secondary | ICD-10-CM | POA: Diagnosis not present

## 2021-06-13 DIAGNOSIS — L89223 Pressure ulcer of left hip, stage 3: Secondary | ICD-10-CM | POA: Diagnosis not present

## 2021-06-13 DIAGNOSIS — J449 Chronic obstructive pulmonary disease, unspecified: Secondary | ICD-10-CM | POA: Diagnosis not present

## 2021-06-13 DIAGNOSIS — G0490 Encephalitis and encephalomyelitis, unspecified: Secondary | ICD-10-CM | POA: Diagnosis not present

## 2021-06-13 DIAGNOSIS — S066X0D Traumatic subarachnoid hemorrhage without loss of consciousness, subsequent encounter: Secondary | ICD-10-CM | POA: Diagnosis not present

## 2021-06-13 DIAGNOSIS — J9601 Acute respiratory failure with hypoxia: Secondary | ICD-10-CM | POA: Diagnosis not present

## 2021-06-13 DIAGNOSIS — R112 Nausea with vomiting, unspecified: Secondary | ICD-10-CM | POA: Diagnosis not present

## 2021-06-13 DIAGNOSIS — I480 Paroxysmal atrial fibrillation: Secondary | ICD-10-CM | POA: Diagnosis not present

## 2021-06-13 DIAGNOSIS — N186 End stage renal disease: Secondary | ICD-10-CM | POA: Diagnosis not present

## 2021-06-13 DIAGNOSIS — J45909 Unspecified asthma, uncomplicated: Secondary | ICD-10-CM | POA: Diagnosis not present

## 2021-06-13 DIAGNOSIS — J9690 Respiratory failure, unspecified, unspecified whether with hypoxia or hypercapnia: Secondary | ICD-10-CM | POA: Diagnosis not present

## 2021-06-13 DIAGNOSIS — S81802A Unspecified open wound, left lower leg, initial encounter: Secondary | ICD-10-CM | POA: Diagnosis not present

## 2021-06-13 DIAGNOSIS — J387 Other diseases of larynx: Secondary | ICD-10-CM | POA: Diagnosis not present

## 2021-06-13 DIAGNOSIS — R6520 Severe sepsis without septic shock: Secondary | ICD-10-CM | POA: Diagnosis not present

## 2021-06-13 DIAGNOSIS — K9423 Gastrostomy malfunction: Secondary | ICD-10-CM | POA: Diagnosis not present

## 2021-06-13 DIAGNOSIS — R1311 Dysphagia, oral phase: Secondary | ICD-10-CM | POA: Diagnosis not present

## 2021-06-13 DIAGNOSIS — F1120 Opioid dependence, uncomplicated: Secondary | ICD-10-CM | POA: Diagnosis not present

## 2021-06-13 DIAGNOSIS — Z4659 Encounter for fitting and adjustment of other gastrointestinal appliance and device: Secondary | ICD-10-CM | POA: Diagnosis not present

## 2021-06-13 DIAGNOSIS — I10 Essential (primary) hypertension: Secondary | ICD-10-CM | POA: Diagnosis not present

## 2021-06-13 DIAGNOSIS — R918 Other nonspecific abnormal finding of lung field: Secondary | ICD-10-CM | POA: Diagnosis not present

## 2021-06-13 DIAGNOSIS — R1310 Dysphagia, unspecified: Secondary | ICD-10-CM | POA: Diagnosis not present

## 2021-06-13 DIAGNOSIS — M6281 Muscle weakness (generalized): Secondary | ICD-10-CM | POA: Diagnosis not present

## 2021-06-13 DIAGNOSIS — K3184 Gastroparesis: Secondary | ICD-10-CM | POA: Diagnosis not present

## 2021-06-13 DIAGNOSIS — R0989 Other specified symptoms and signs involving the circulatory and respiratory systems: Secondary | ICD-10-CM | POA: Diagnosis not present

## 2021-06-13 DIAGNOSIS — L89154 Pressure ulcer of sacral region, stage 4: Secondary | ICD-10-CM | POA: Diagnosis not present

## 2021-06-13 DIAGNOSIS — S066X0A Traumatic subarachnoid hemorrhage without loss of consciousness, initial encounter: Secondary | ICD-10-CM | POA: Diagnosis not present

## 2021-06-13 DIAGNOSIS — D649 Anemia, unspecified: Secondary | ICD-10-CM | POA: Diagnosis not present

## 2021-06-13 DIAGNOSIS — I9589 Other hypotension: Secondary | ICD-10-CM | POA: Diagnosis not present

## 2021-06-13 DIAGNOSIS — K922 Gastrointestinal hemorrhage, unspecified: Secondary | ICD-10-CM | POA: Diagnosis not present

## 2021-06-13 DIAGNOSIS — G4733 Obstructive sleep apnea (adult) (pediatric): Secondary | ICD-10-CM | POA: Diagnosis not present

## 2021-06-13 DIAGNOSIS — R1319 Other dysphagia: Secondary | ICD-10-CM | POA: Diagnosis not present

## 2021-06-13 DIAGNOSIS — D631 Anemia in chronic kidney disease: Secondary | ICD-10-CM | POA: Diagnosis not present

## 2021-06-13 DIAGNOSIS — J189 Pneumonia, unspecified organism: Secondary | ICD-10-CM | POA: Diagnosis not present

## 2021-06-13 DIAGNOSIS — H9192 Unspecified hearing loss, left ear: Secondary | ICD-10-CM | POA: Diagnosis not present

## 2021-06-13 DIAGNOSIS — Z9911 Dependence on respirator [ventilator] status: Secondary | ICD-10-CM | POA: Diagnosis not present

## 2021-06-13 DIAGNOSIS — D62 Acute posthemorrhagic anemia: Secondary | ICD-10-CM | POA: Diagnosis not present

## 2021-06-13 DIAGNOSIS — K2100 Gastro-esophageal reflux disease with esophagitis, without bleeding: Secondary | ICD-10-CM | POA: Diagnosis not present

## 2021-06-13 DIAGNOSIS — R4189 Other symptoms and signs involving cognitive functions and awareness: Secondary | ICD-10-CM | POA: Diagnosis not present

## 2021-06-13 DIAGNOSIS — I4891 Unspecified atrial fibrillation: Secondary | ICD-10-CM | POA: Diagnosis not present

## 2021-06-13 LAB — CBC WITH DIFFERENTIAL/PLATELET
Abs Immature Granulocytes: 0.3 10*3/uL — ABNORMAL HIGH (ref 0.00–0.07)
Basophils Absolute: 0.1 10*3/uL (ref 0.0–0.1)
Basophils Relative: 1 %
Eosinophils Absolute: 0.5 10*3/uL (ref 0.0–0.5)
Eosinophils Relative: 4 %
HCT: 23.4 % — ABNORMAL LOW (ref 36.0–46.0)
Hemoglobin: 7.4 g/dL — ABNORMAL LOW (ref 12.0–15.0)
Immature Granulocytes: 2 %
Lymphocytes Relative: 8 %
Lymphs Abs: 1 10*3/uL (ref 0.7–4.0)
MCH: 27.8 pg (ref 26.0–34.0)
MCHC: 31.6 g/dL (ref 30.0–36.0)
MCV: 88 fL (ref 80.0–100.0)
Monocytes Absolute: 0.6 10*3/uL (ref 0.1–1.0)
Monocytes Relative: 5 %
Neutro Abs: 10.5 10*3/uL — ABNORMAL HIGH (ref 1.7–7.7)
Neutrophils Relative %: 80 %
Platelets: 307 10*3/uL (ref 150–400)
RBC: 2.66 MIL/uL — ABNORMAL LOW (ref 3.87–5.11)
RDW: 17.6 % — ABNORMAL HIGH (ref 11.5–15.5)
WBC: 13 10*3/uL — ABNORMAL HIGH (ref 4.0–10.5)
nRBC: 0.2 % (ref 0.0–0.2)

## 2021-06-13 LAB — RENAL FUNCTION PANEL
Albumin: 1.9 g/dL — ABNORMAL LOW (ref 3.5–5.0)
Anion gap: 10 (ref 5–15)
BUN: 157 mg/dL — ABNORMAL HIGH (ref 8–23)
CO2: 25 mmol/L (ref 22–32)
Calcium: 8.8 mg/dL — ABNORMAL LOW (ref 8.9–10.3)
Chloride: 95 mmol/L — ABNORMAL LOW (ref 98–111)
Creatinine, Ser: 3.67 mg/dL — ABNORMAL HIGH (ref 0.44–1.00)
GFR, Estimated: 13 mL/min — ABNORMAL LOW (ref >60)
Glucose, Bld: 127 mg/dL — ABNORMAL HIGH (ref 70–99)
Phosphorus: 5.5 mg/dL — ABNORMAL HIGH (ref 2.5–4.6)
Potassium: 4.8 mmol/L (ref 3.5–5.1)
Sodium: 130 mmol/L — ABNORMAL LOW (ref 135–145)

## 2021-06-13 LAB — TYPE AND SCREEN
ABO/RH(D): O POS
Antibody Screen: NEGATIVE

## 2021-06-13 LAB — GLUCOSE, CAPILLARY
Glucose-Capillary: 123 mg/dL — ABNORMAL HIGH (ref 70–99)
Glucose-Capillary: 130 mg/dL — ABNORMAL HIGH (ref 70–99)
Glucose-Capillary: 150 mg/dL — ABNORMAL HIGH (ref 70–99)
Glucose-Capillary: 170 mg/dL — ABNORMAL HIGH (ref 70–99)
Glucose-Capillary: 86 mg/dL (ref 70–99)

## 2021-06-13 LAB — MAGNESIUM: Magnesium: 2.7 mg/dL — ABNORMAL HIGH (ref 1.7–2.4)

## 2021-06-13 MED ORDER — LOPERAMIDE HCL 2 MG PO CAPS: 2.0000 mg | ORAL_CAPSULE | ORAL | 0 refills | Status: DC | PRN

## 2021-06-13 MED ORDER — DEXTROSE 5 % IV SOLN: 2.0000 mg | Freq: Every day | INTRAVENOUS | Status: DC

## 2021-06-13 MED ORDER — INSULIN ASPART 100 UNIT/ML IJ SOLN
0.0000 [IU] | INTRAMUSCULAR | Status: DC
  Administered 2021-06-13: 3 [IU] via SUBCUTANEOUS
  Filled 2021-06-13: qty 1

## 2021-06-13 MED ORDER — FLORANEX PO PACK: 1.0000 g | PACK | Freq: Three times a day (TID) | ORAL | Status: DC

## 2021-06-13 MED ORDER — VITAMIN D3 25 MCG PO TABS: 2000.0000 [IU] | ORAL_TABLET | Freq: Every day | ORAL | Status: DC

## 2021-06-13 MED ORDER — BLISTEX MEDICATED EX OINT: 1.0000 "application " | TOPICAL_OINTMENT | Freq: Three times a day (TID) | CUTANEOUS | Status: DC

## 2021-06-13 MED ORDER — CHLORHEXIDINE GLUCONATE 0.12% ORAL RINSE (MEDLINE KIT): 15.0000 mL | Freq: Two times a day (BID) | OROMUCOSAL | 0 refills | Status: DC

## 2021-06-13 MED ORDER — RENA-VITE PO TABS: 1.0000 | ORAL_TABLET | Freq: Every day | ORAL | 0 refills | Status: DC

## 2021-06-13 MED ORDER — ASCORBIC ACID 500 MG PO TABS: 500.0000 mg | ORAL_TABLET | Freq: Two times a day (BID) | ORAL | Status: DC

## 2021-06-13 MED ORDER — PANTOPRAZOLE SODIUM 40 MG PO PACK: 40.0000 mg | PACK | Freq: Every day | ORAL | Status: DC

## 2021-06-13 MED ORDER — IPRATROPIUM-ALBUTEROL 0.5-2.5 (3) MG/3ML IN SOLN: 3.0000 mL | Freq: Three times a day (TID) | RESPIRATORY_TRACT | Status: DC

## 2021-06-13 MED ORDER — ALBUMIN HUMAN 25 % IV SOLN
25.0000 g | Freq: Once | INTRAVENOUS | Status: AC
  Administered 2021-06-13: 25 g via INTRAVENOUS

## 2021-06-13 MED ORDER — SENNOSIDES 8.8 MG/5ML PO SYRP: 10.0000 mL | ORAL_SOLUTION | Freq: Every evening | ORAL | 0 refills | Status: DC | PRN

## 2021-06-13 MED ORDER — ESCITALOPRAM OXALATE 10 MG PO TABS: 10.0000 mg | ORAL_TABLET | Freq: Every day | ORAL | Status: DC

## 2021-06-13 MED ORDER — MIDODRINE HCL 5 MG PO TABS
15.0000 mg | ORAL_TABLET | Freq: Three times a day (TID) | ORAL | Status: DC
Start: 2021-06-13 — End: 2023-01-03

## 2021-06-13 MED ORDER — COLLAGENASE 250 UNIT/GM EX OINT: TOPICAL_OINTMENT | Freq: Every day | CUTANEOUS | 0 refills | Status: DC

## 2021-06-13 MED ORDER — ARTIFICIAL TEARS OPHTHALMIC OINT: TOPICAL_OINTMENT | OPHTHALMIC | Status: DC | PRN

## 2021-06-13 MED ORDER — HEPARIN SODIUM (PORCINE) 1000 UNIT/ML IJ SOLN
3300.0000 [IU] | Freq: Once | INTRAMUSCULAR | Status: AC
  Administered 2021-06-13: 3300 [IU]

## 2021-06-13 MED ORDER — AMIODARONE HCL 200 MG PO TABS: 200.0000 mg | ORAL_TABLET | Freq: Two times a day (BID) | ORAL | Status: DC

## 2021-06-13 NOTE — Consult Note (Signed)
PHARMACY CONSULT NOTE ? ?Pharmacy Consult for Electrolyte Monitoring and Replacement  ? ?Recent Labs: ?Potassium (mmol/L)  ?Date Value  ?06/13/2021 4.8  ?08/11/2013 3.3 (L)  ? ?Magnesium (mg/dL)  ?Date Value  ?06/13/2021 2.7 (H)  ? ?Calcium (mg/dL)  ?Date Value  ?06/13/2021 8.8 (L)  ? ?Calcium, Total (mg/dL)  ?Date Value  ?08/11/2013 9.2  ? ?Albumin (g/dL)  ?Date Value  ?06/13/2021 1.9 (L)  ?08/11/2013 3.1 (L)  ? ?Phosphorus (mg/dL)  ?Date Value  ?06/13/2021 5.5 (H)  ? ?Sodium (mmol/L)  ?Date Value  ?06/13/2021 130 (L)  ?08/11/2013 138  ? ?Assessment: ?71 yo female with a previous medical history of diabetes mellitus type 2, hypertension, obstructive sleep apnea on CPAP, asthma/COPD, GERD, history of COVID-19 pneumonia presenting with lower left extremity pain and chills. Patient found to have severe sepsis and strep pyogenes bacteremia on admission and is currently intubated and sedated. LLL necrotizing fascitis. [facility] been consulted to monitor and replace electrolytes.  ? ?--CRRT stopped 2/6, re-started (2/8>> 2/20) ?--s/p trach 2/3, tolerating trach collar trials ?--receiving iHD nephrology ?--PermCath placed 3/1 ? ?Nutrition:  ?--Pivot per tube at 65 mL/hr ?--Free water per tube 30 mL q4h ? ?Goal of Therapy:  ?Electrolytes within normal limits ? ?Plan:  ?No electrolyte replacement warranted for today ?Mild stable hyponatremia / hypochloremia, Fluid balance / adjustment as indicated per [facility] / nephrology ?Renal function panel daily ? ?Benita Gutter  ?06/13/2021 7:29 AM ? ?

## 2021-06-13 NOTE — Progress Notes (Signed)
?   06/13/21 1630  ?Clinical Encounter Type  ?Visited With Patient;Family;Health care provider  ?Visit Type Follow-up;Spiritual support;Social support  ? ?Chaplain Deloria Lair was notified of Pt's daughter returning to unit and imminent plan to move Mrs. Nagy this evening. Daryel November offered a new prayer shawl and presented to [place]. Chaplain B invited Pt's daughter to share reflections of time at this hospital and what her mother's journey has meant for their family. The experiences were filled with gratitude and a sense of deepened faith. ? ?Chaplain B and [place] shared prayer together. Chaplain also attended to Mrs.Lacasse at bedside to offer a blessing and also to share gratitude with Herschel Sen[place], Therapist, sports and for the others who have shared in her care; this team truly touched the family with their meaningful, compassionate care. ? ? ?

## 2021-06-13 NOTE — Progress Notes (Signed)
Nutrition Follow Up Note  ? ?DOCUMENTATION CODES:  ? ?Morbid obesity ? ?INTERVENTION:  ? ?Continue Pivot 1.5 @ goal rate of 79m/hr ? ?Free water flushes 341mq4 hours to maintain tube patency  ? ?Regimen provides 2340kcal/day, 146g/day protein and 136468may of free water  ? ?Continue Juven Fruit Punch BID via tube, each serving provides 95kcal and 2.5g of protein (amino acids glutamine and arginine) ? ?Continue Rena-vit daily via tube  ? ?Continue Vitamin C 500m45mD via tube  ? ?Continue Vitamin A 10,000 units daily via tube  ? ?Continue Cholecalciferol 2000 units daily via tube  ? ?Continue Copper Chloride 2mg 29mdaily x 7 days ? ?Continue Vitamin E 400 units daily via tube  ? ?NUTRITION DIAGNOSIS:  ? ?Increased nutrient needs related to wound healing as evidenced by estimated needs. ? ?GOAL:  ? ?Provide needs based on ASPEN/SCCM guidelines ?-met with tube feeds  ? ?MONITOR:  ? ?Labs, Weight trends, TF tolerance, Skin, I & O's, Vent status ? ?ASSESSMENT:  ? ?70 y.6 female with a history of asthma/COPD, HTN, DMII, obesity, OSA on CPAP, GERD and COVID-19 pneumonia who was admitted 1/22 w/ left LE cellulitis, septic shock, S pyogenes bacteremia, hypotension, new Afib and AKI. ? 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  ?Pt s/p R IJ tunneled HD cath, right IJ triple lumen CVC and 33F G-tube 3/1  ? ?Ct scan from 2/28 reporting retention balloon or button on PEG tube outside the stomach in the anterior abdominal wall. Pt s/p IR G-tube placement 3/1. Tube feeds were restarted this morning and pt is tolerating well. Rectal tube removed; diarrhea improved. Plan is for  HD today. Vitamins being repleted. Per chart, pt is weight stable. Pt +10.6L on her I & Os. [facility].   ? ?Medications reviewed and include: vitamin C, D3, insulin, lactobacillus, reglan, rena-vit, juven, protonix, vitamin A, vitamin E, copper chloride ? ?Labs reviewed: Na 130(L), K 4.8 wnl, BUN 157(H), creat 3.67(H), P 5.5(H), Mg 2.7(H) ?Ferritin- 126- 1/21 ?Copper 24(L)- 2/24 ?Vitamin C 2.0- 2/14  ?Vitamin D 16.13(L), vitamin A 56.5 wnl, vitamin E 7.2(L), zinc 119(H)- 2/21 ?WBC- 13.0(H), Hgb 7.4(L), Hct 23.4(L) ?Cbgs- 150, 130, 123, 86 x 24hrs ?AIC- 6.7(H)- 1/22 ? ?Drains- 290ml 79mUOP- 850ml  5miet Order:   ?Diet Order   ? ? None  ? ?  ? ?EDUCATION NEEDS:  ? ?Education needs have been addressed ? ?Skin: WOC assessment reviewed: 40 x 35 cm left leg wound with VAC, lower lip .5X.5cm, upper lip 1X1.5 cm, middle back 1.5X1.5X.1cm, right posterior flank 3X10X.2cm, right upper buttock Stage 2 pressure injury; 1X1X.1cm, inner gluteal fold  1X.1X.1cm, inner buttocks1X1X.1cm. ? ?Last BM:  3/2- type 6 ? ?Height:  ? ?Ht Readings from Last 1 Encounters:  ?05/31/21 '5\' 5"'  (1.651 m)  ? ? ?Weight:  ? ?Wt Readings from Last 1 Encounters:  ?06/13/21 117.3 kg  ? ? ?Ideal Body Weight:  56.8 kg ? ?BMI:  Body mass index  is 43.03 kg/m?. ? ?Estimated Nutritional Needs:  ? ?Kcal:  2200-2500kcal/day ? ?Protein:  120-140 grams ? ?Fluid:  1.7-2.0L/day ? ?Koleen Distance MS, RD, LDN ?Please refer to [facility] for RD and/or RD on-call/weekend/after hours pager ? ?

## 2021-06-13 NOTE — Plan of Care (Signed)
Pt discharged

## 2021-06-13 NOTE — Progress Notes (Signed)
?   06/13/21 1100  ?Clinical Encounter Type  ?Visited With Patient  ?Visit Type Follow-up  ?Spiritual Encounters  ?Spiritual Needs Prayer  ? ?Chaplain provided compassionate presence of non-verbal patient who appears to be alert only for seconds at a time. Chaplain interceded through prayer. ?

## 2021-06-13 NOTE — Progress Notes (Signed)
?   06/13/21 1430  ?Clinical Encounter Type  ?Visited With Family;Health care provider  ?Visit Type Follow-up  ?Spiritual Encounters  ?Spiritual Needs Other (Comment) ?(prayer shawl)  ? ?Chaplain Burris received notification that Pt's prayer shawl was lost since last night, possibly in process of MRI. F/U with Herschel [place], RN and with Pt's daughter to assess need. Chaplain went to MRI suite to search for item. Will replace if needed. ?

## 2021-06-13 NOTE — Discharge Summary (Signed)
Physician Discharge Summary  Patient ID: Alexandra Foster MRN: [id number] DOB/AGE: 1950/12/17 71 y.o.  Admit date: 05/04/2021 Discharge date: 06/13/2021  Admission Diagnoses:LLE NECROTIZING FASCIITIS  Discharge Diagnoses:  Principal Problem:   Severe sepsis with septic shock (CODE) ([place]) Active Problems:   COPD (chronic obstructive pulmonary disease) (HCC)   Morbid obesity ([place])   Diabetes mellitus type 2 in obese (HCC)   Atrial fibrillation and flutter (HCC)   Necrotizing fasciitis ([place])   Acute and chronic respiratory failure (acute-on-chronic) (HCC)   On mechanically assisted ventilation ([place])   Shock liver   Anemia associated with acute blood loss   Metabolic acidosis   Encounter for continuous renal replacement therapy (CRRT) for acute renal failure (HCC)   Acute encephalopathy   Group A streptococcal infection   Streptococcal toxic shock syndrome ([place])   Status post tracheostomy ([place])   Subdural hemorrhage (HCC)   Left leg cellulitis   Abdominal distension  71 y.o female with medical history significant of Asthma/COPD, T2DM, HTN, OSA on CPAP, GERD, COVID-19 pneumonia, and Bilateral lower extremity edema who presented to the ED with chief complaints of LLE pain and chills    Patient report onset of symptoms for the past several days with worsening pain, swelling, fevers and chills, poor po intake and difficulty walking since Thursday. Denies injury to extremity or hx of cellulitis.   ED Course: In the emergency department, the temperature was 37.7C, the heart rate 103 beats/minute, the blood pressure 75/64  mm Hg, the respiratory rate 27 breaths/minute, and the oxygen saturation 99% on RA. CT of left lower leg showed diffuse subcutaneous edema concerning for cellulitis with no fluid collection/abscess. Pertinent Labs in Red/Diagnostics Findings: Na+/ K+: 141/2.9 Glucose: 144 BUN/Cr.: 44/3.47 AST/ALT:67/48   WBC/ TMAX: 16.5/ afebrile PCT: pending Lactic acid:  3.8>6.0 COVID PCR: Negative   Patient given 30 cc/kg of fluids and started on broad-spectrum antibiotics for sepsis with septic shock. Patient remained hypotensive despite IVF boluses therefore was started on Levophed. PCCM consulted.   Past Medical History     Arthritis   Asthma   COPD (chronic obstructive pulmonary disease) ([place])   Diabetes mellitus without complication ([place])   Endometriosis   GERD (gastroesophageal reflux disease)   Hypertension   Obesity   Sleep apnea    CPAP    Patient with severe Septic shock and diagnosis of STREP PYOGENS BACTEREMIA WITH LLE NECROTIZING FASCIITIS   MRI/CT ABD SHOWS PANCREATIC CYSTS-WILL NEED FOLLOW UP  AS NEEDED   Discharged Condition: stable  Hospital Course:  1/21: Admitted to the ICU with severe sepsis with shock secondary to cellulitis of LLE 1/23: off pressors +Afib with RVR 1/24: OR for fasciotomy 1/25: post op resp failure with severe septic shock 1/26: remains on pressors 1/27: returns to the OR today for debridement, wound VAC 1/28: overnight with hemodynamic deterioration, required reintubation, currently on pressors 1/29: remains on 3 pressors, CRRT, wound VAC and minimal sedation.  Awakens to voice and touch.  Seen by vascular surgery and awaiting input from general surgery regarding amputation of left leg 2/1: remains on pressors, on CRRT, MRI/CT shows SAH, afib with RVR, ECHO pending, restarted AMIO at 60 mg/hr 2/3: remains on crrt, remains on vent, plan for [facility] and wound vac change in OR 2/3: S/P TRACH, omplantation of cellular matrix on left leg 2/4: REMAINS ON PRESSORS, REMAINS ON VENT, REMAINS ON CRRT 2/5: REMAINS ON VENT, OFF PRESSORS, REMAINS ON CRRT 2/6: Patient is improved she is able to follow communication  verbally and move extermities. She is on 5L/min Trache collar. We have stopped levophed and working on reducing vasopressin. She remains on CRRT.  Insulin infusion is ongoing and plan to have weight based  regimen initiated. Additionally she will have SLP for vocalization and PMV trial as well as ROM training with OT/PT.  Multiple specialists on case appreciate everyone involved.  2/7: patient is improved mildly , she's off CRRT, shes off insulin drip. She is mentating.  She was evaluated by [facility].  She was unable to vocalize with PMV.  She may need PEG tube for nourishment.  2/8: patient is overnight worsened with fever and increased levophed dose.  She is negative 1.7L.   She is on zosyn.  2/9: patient is improved this am. She is giving thumbs up during examination. 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 She seems volume depleted as well so we will start CVP monitoring and give IV LR 1L bolus today to check for volume responsiveness. Albumin also while on CRRT. PT/OT starting today. Type and screen repeat due to plan for OR in am. On zosyn with ID following. Starting [place] heparin today.  2/10: patient is s/p repeat surgery on RLE. She is on levophed 52mg post surgery. She is not tachycardic and able to follow some verbal communication.  2/11: patient stable overnight. Reordering PT/OT today. Wbc count slightly trending up remains on abx probably post surgical.  Remains severely anemic on CRRT and vasopressor support with tracheostomy.  2/12: patient with severe anemia today.  Reviewed with renal team for blood transfusion today.  2/13: remains critically ill, on pressors and CRRT 2/13: trach changed for a #8 cuffed distal XLT without difficulty. Wound cleaner. PER ENT 2/14: remains on pressors, CRRT, VENT, plan for PEG and WOUND vac change at bedside; received 1 unit of PRBC's due to anemia  2/15: s/p IVC filter placement due to inability to tolerate anticoagulation  2/16: plan for Trach collar trials as tolerated.  Continues on CRRT. Pt refusing nursing care and to work with PT/OT, Palliative Care  re-consulted 2/17: Pt seems depressed, refusing nursing care.  Consult Psychiatry. 2/18: Dialysis filter continues to clot requiring filter changes with inability to return blood back.  Hgb 6.4 pt received 1 unit pRBC's.  Increased amiodarone frequency from 200 mg daily to bid due to pt converting in and out of atrial fibrillation with rvr  2/19: CRRT discontinued due to frequent clotting of circuit resulting in acute blood loss anemia due to inability to return blood back to pt  2/20: Pt currently off of vasopressors following iv fluid resuscitation  2/21: TCT for 30 hrs 2/22 TCT for 48 hrs, tolerated HD without pressors 2/23: General surgery discussing left AKA with pts family~pt with major stenotic complications on the ligaments and tendons resulting in difficult case for skin grafting in the future.  Vascular consulted for possible left AKA 2/25: Remains off vent and tolerating TC.  Plan for HD today.  Process for LTAC placement in process.  Pt's family is deciding on whether to pursue amputation vs. Negative pressure dressing and local care 2/26: Required pressors for dialysis yesterday and has remained on low-dose norepinephrine.  She does acknowledge examiner but does not answer any questions shakes head "no" to everything asked, remains off ventilator, VASC CATH REMOVED FOR LINE HOLIDAY 2/28 off vent off pressors, CT ABD SHOWS ?PANCREATIC MASS, PEG TUBE DISLODGED 3/1 PEG replaced, PERM  catha and CVL placed by IR, MRI ABD SHOWS PANCREATIC CYSTS BUT CANT TELL IF ITS MALIGNANT  3/2 Patient has been off VENT for 1 week More alert and awake, follows commands, MOOD improving    Consults: cardiology, nephrology, general surgery, and vascular surgery  Significant Diagnostic Studies: microbiology: blood culture: STREP PYOGENES  and radiology: MRI: +pancreatic cysts may  need ENDOSCOPIC [place] per radiology  recommend repeat scan at some point  Significant Diagnostic Tests:  1/21: Chest Xray> no  active cardiopulmonary process 1/21: CT left lower leg>Diffuse subcutaneous edema may represent cellulitis. No drainable fluid collection/abscess 1/21: Ultrasound lower unilateral left> no DVT   Micro Data:  1/21: SARS-CoV-2 PCR>>negative 1/21: Influenza PCR>>negative 1/21: Blood culture x2>>group A strep (strep pyogenes) 1/21: Urine Culture>negative  1/21: MRSA PCR>>negative  1/24: Left leg wound culture>>negative  2/7: Lurline Idol site would culture>>few pseudomonas aeruginosa and few candida albicans  2/8: MRSA PCR>> negative 2/26: few pseudomonas aeruginosa   Antimicrobials:  Vancomycin 1/21>>1/22 Cefepime 1/21>>01/22 Clindamycin 1/22>>1/25 Ceftriaxone 1/21 X1; restarted 1/31>>2/6 Penicillin G 1/22>>1/31 Linezolid 1/25>>1/31 Metronidazole 02/6 x1 dose Unasyn 2/7>>2/7 Zosyn 2/7>>2/17     Respiratory Failure s/p tracheostomy  tolerating TCT Severe sepsis with streptococcal toxic shock was precipitating factor Weaned off of vent Hx: COPD, OSA, and Obesity TCT as tolerated - Continue trach collar - Intermittent chest x-ray & ABG PRN - Ensure adequate pulmonary hygiene      Intermittent atrial fibrillation with rvr~RESOLVED  - Continuous cardiac monitoring - Continue scheduled midodrine   - Continue amiodarone    Left necrotizing fascitis (strep pyogenes)-present on admission s/p multiple debridements, application of wound VAC system, s/p myriad matrix placement 05/17/2021 -myriad matrix failed Streptococcal toxic shock syndrome Pseudomonas in Tracheal wound culture 2/7  - Monitor fever curve - General Surgery following, appreciate input ~ wound care as per surgery - Vascular Surgery consulted for possible left AKA awaiting pts family decision regarding this FAMILY DOES NOT ANT AMPUTATION AT THIS TIME PERM CATH AND CVL placed by IR 3/1     ACUTE KIDNEY INJURY/Renal Failure -continue Foley Catheter-assess need -Avoid nephrotoxic agents -Follow urine output, BMP -Ensure  adequate renal perfusion, optimize oxygenation -Renal dose medications     Intake/Output Summary (Last 24 hours) at 06/13/2021 0734 Last data filed at 06/13/2021 0602    Gross per 24 hour  Intake 1081.9 ml  Output 1050 ml  Net 31.9 ml      ACUTE ANEMIA- TRANSFUSE AS NEEDED CONSIDER TRANSFUSION  IF HGB<7 DVT PRX with TED/SCD's ONLY -Unable to tolerate anticoagulants or SCD's for VTE Prophylaxis ~  IVC filter placed 2/15     Toxic metabolic encephalopathy~improving  Minor SAH  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  NO ANTICOAGULATION FOR  1 MONTH AND THEN NEED TO RE-ASSESS NO ANTIPLATELET AGENTS FOR 1 MONTH AND THEN NEED TO RE-ASSESS CAN POSSIBLY CONSIDER BABY ASA BUT WOULD NEED TO PROCEED WITH CAUTION -Continue Lexapro -Provide supportive care -Promote normal sleep/wake cycle -Promote family presence -Avoid sedating meds as able   ENDO - ICU hypoglycemic\Hyperglycemia protocol -check FSBS per protocol     GI GI PROPHYLAXIS as indicated   NUTRITIONAL STATUS DIET-->TF's as tolerated Constipation protocol as indicated     ELECTROLYTES -follow labs as needed -replace as needed -pharmacy consultation and following        GI Moderate malnutrition Sarcopenic obesity Debility/deconditioning Malpositioned PEG tube-re-inserted by IR ?malignant cyctic mass Will need input form SURGERY/GI    Discharge Exam: Blood pressure (!) 121/54, pulse 90,  temperature 98.7 F (37.1 C), temperature source Axillary, resp. rate (!) 24, height '5\' 5"'  (1.651 m), weight 117.3 kg, SpO2 99 %.   Disposition: [facility]   Allergies as of 06/13/2021   No Known Allergies      Medication List     STOP taking these medications    accu-chek soft touch lancets   albuterol (2.5 MG/3ML) 0.083% nebulizer solution Commonly known as: PROVENTIL   aspirin 81 MG tablet   atorvastatin 40 MG tablet Commonly known as: LIPITOR   azelastine 0.1 % nasal spray Commonly known  as: ASTELIN   baclofen 10 MG tablet Commonly known as: LIORESAL   calcium carbonate 1500 (600 Ca) MG Tabs tablet Commonly known as: OSCAL   celecoxib 200 MG capsule Commonly known as: CELEBREX   docusate sodium 100 MG capsule Commonly known as: COLACE   fluticasone 50 MCG/ACT nasal spray Commonly known as: FLONASE   glucose blood test strip Commonly known as: Accu-Chek Aviva Plus   loratadine 10 MG tablet Commonly known as: CLARITIN   meloxicam 15 MG tablet Commonly known as: MOBIC   metFORMIN 750 MG 24 hr tablet Commonly known as: GLUCOPHAGE-XR   montelukast 10 MG tablet Commonly known as: SINGULAIR   MULTIVITAL PO   omeprazole 20 MG capsule Commonly known as: PRILOSEC   OrthoVisc 30 MG/2ML Sosy Generic drug: Hyaluronan   Trelegy Ellipta 100-62.5-25 MCG/ACT Aepb Generic drug: Fluticasone-Umeclidin-Vilant   triamterene-hydrochlorothiazide 37.5-25 MG capsule Commonly known as: DYAZIDE   vitamin B-12 100 MCG tablet Commonly known as: CYANOCOBALAMIN   ZyrTEC Allergy 10 MG Tbdp Generic drug: Cetirizine HCl       TAKE these medications    amiodarone 200 MG tablet Commonly known as: PACERONE Place 1 tablet (200 mg total) into feeding tube 2 (two) times daily.   artificial tears Oint ophthalmic ointment Commonly known as: LACRILUBE Place into both eyes every 4 (four) hours as needed for dry eyes.   ascorbic acid 500 MG tablet Commonly known as: VITAMIN C Place 1 tablet (500 mg total) into feeding tube 2 (two) times daily.   chlorhexidine gluconate (MEDLINE KIT) 0.12 % solution Commonly known as: PERIDEX 15 mLs by Mouth Rinse route 2 (two) times daily.   collagenase ointment Commonly known as: SANTYL Apply topically daily. Start taking on: June 14, 2021   dextrose 5 % SOLN 100 mL with copper chloride 0.4 MG/ML SOLN 2 mg Inject 2 mg into the vein daily. Start taking on: June 14, 2021   escitalopram 10 MG tablet Commonly known as:  LEXAPRO Place 1 tablet (10 mg total) into feeding tube daily. Start taking on: June 14, 2021   ipratropium-albuterol 0.5-2.5 (3) MG/3ML Soln Commonly known as: DUONEB Take 3 mLs by nebulization 3 (three) times daily.   lactobacillus Pack Place 1 packet (1 g total) into feeding tube 3 (three) times daily with meals.   lip balm Oint Apply 1 application topically 3 (three) times daily.   loperamide 2 MG capsule Commonly known as: IMODIUM Take 1 capsule (2 mg total) by mouth as needed for diarrhea or loose stools.   midodrine 5 MG tablet Commonly known as: PROAMATINE Place 3 tablets (15 mg total) into feeding tube 3 (three) times daily with meals.   multivitamin Tabs tablet Place 1 tablet into feeding tube at bedtime.   pantoprazole sodium 40 mg Commonly known as: PROTONIX Place 40 mg into feeding tube daily.   sennosides 8.8 MG/5ML syrup Commonly known as: SENOKOT Place 10 mLs into feeding  tube at bedtime as needed for moderate constipation.   Vitamin D3 25 MCG tablet Commonly known as: Vitamin D Place 2 tablets (2,000 Units total) into feeding tube daily. Start taking on: June 14, 2021         Signed: Flora Lipps 06/13/2021, 1:25 PM

## 2021-06-13 NOTE — TOC Transition Note (Signed)
Transition of Care (TOC) - CM/SW Discharge Note ? ? ?Patient Details  ?Name: Alexandra Foster ?MRN: [id number] ?Date of Birth: November 30, 1950 ? ?Transition of Care (TOC) CM/SW Contact:  ?Shelbie Hutching, RN ?Phone Number: ?06/13/2021, 2:44 PM ? ? ?Clinical Narrative:    ?Patient has been accepted to [facility] Medicare has approved authorization for [facility].  [facility] has a bed today room 316.  RNCM has arranged for [facility] at 5pm.  Patient currently getting HD.  Family has been notified of discharge to [facility] today and they agree with transfer.  ICU RN will call report to [phone] or [phone]. ? ? ?Final next level of care: [facility] (LTAC) ?Barriers to Discharge: Barriers Resolved ? ? ?Patient Goals and CMS Choice ?Patient states their goals for this hospitalization and ongoing recovery are:: Family and patient agree to LTAC ?CMS Medicare.gov Compare Post Acute Care list provided to:: Patient Represenative (must comment) ?Choice offered to / list presented to : Adult Children ? ?Discharge Placement ?  ?           ?Patient chooses bed at: Other - please specify in the comment section below: ([facility] in [place]) ?Patient to be transferred to facility by: [facility] ?Name of family member notified: Roderic Ovens ?Patient and family notified of of transfer: 06/13/21 ? ?Discharge Plan and Services ?  ?Discharge Planning Services: CM Consult ?           ?DME Arranged: N/A ?DME Agency: NA ?  ?  ?  ?HH Arranged: NA ?  ?  ?  ?  ? ?Social Determinants of Health (SDOH) Interventions ?  ? ? ?Readmission Risk Interventions ?No flowsheet data found. ? ? ? ? ?

## 2021-06-13 NOTE — Progress Notes (Signed)
Hemodialysis completed with no fluid removal due to blood pressure dropping into low 90s with original ordered uf goal.Albumin was given during treatment and midodrin dose was given pre-dialysis for blood pressure support. Alexandra Breeze,NP was notified.Dialysis lines clotted during treatment, able to rinseback blood and start new system with no other problems during treatment. Patient resting post dialysis with no distress noted. ?

## 2021-06-13 NOTE — Progress Notes (Signed)
NAME:  Alexandra Foster, MRN:  [id number], DOB:  26-May-1950, LOS: 36 ADMISSION DATE:  05/04/2021, CONSULTATION DATE:  05/04/2021 REFERRING MD:  Duffy Bruce MD CHIEF COMPLAINT:  Left leg pain   HPI  71 y.o female with medical history significant of Asthma/COPD, T2DM, HTN, OSA on CPAP, GERD, COVID-19 pneumonia, and Bilateral lower extremity edema who presented to the ED with chief complaints of LLE pain and chills   Patient report onset of symptoms for the past several days with worsening pain, swelling, fevers and chills, poor po intake and difficulty walking since Thursday. Denies injury to extremity or hx of cellulitis.  ED Course: In the emergency department, the temperature was 37.7C, the heart rate 103 beats/minute, the blood pressure 75/64  mm Hg, the respiratory rate 27 breaths/minute, and the oxygen saturation 99% on RA. CT of left lower leg showed diffuse subcutaneous edema concerning for cellulitis with no fluid collection/abscess. Pertinent Labs in Red/Diagnostics Findings: Na+/ K+: 141/2.9 Glucose: 144 BUN/Cr.: 44/3.47 AST/ALT:67/48   WBC/ TMAX: 16.5/ afebrile PCT: pending Lactic acid: 3.8>6.0 COVID PCR: Negative  Patient given 30 cc/kg of fluids and started on broad-spectrum antibiotics for sepsis with septic shock. Patient remained hypotensive despite IVF boluses therefore was started on Levophed. PCCM consulted.  Past Medical History    Arthritis   Asthma   COPD (chronic obstructive pulmonary disease) ([place])   Diabetes mellitus without complication ([place])   Endometriosis   GERD (gastroesophageal reflux disease)   Hypertension   Obesity   Sleep apnea    CPAP   Significant Hospital Events   1/21: Admitted to the ICU with severe sepsis with shock secondary to cellulitis of LLE 1/23: off pressors +Afib with RVR 1/24: OR for fasciotomy 1/25: post op resp failure with severe septic shock 1/26: remains on pressors 1/27: returns to the OR today for debridement,  wound VAC 1/28: overnight with hemodynamic deterioration, required reintubation, currently on pressors 1/29: remains on 3 pressors, CRRT, wound VAC and minimal sedation.  Awakens to voice and touch.  Seen by vascular surgery and awaiting input from general surgery regarding amputation of left leg 2/1: remains on pressors, on CRRT, MRI/CT shows SAH, afib with RVR, ECHO pending, restarted AMIO at 60 mg/hr 2/3: remains on crrt, remains on vent, plan for [facility] and wound vac change in OR 2/3: S/P TRACH, omplantation of cellular matrix on left leg 2/4: REMAINS ON PRESSORS, REMAINS ON VENT, REMAINS ON CRRT 2/5: REMAINS ON VENT, OFF PRESSORS, REMAINS ON CRRT 2/6: Patient is improved she is able to follow communication verbally and move extermities. She is on 5L/min Trache collar. We have stopped levophed and working on reducing vasopressin. She remains on CRRT.  Insulin infusion is ongoing and plan to have weight based regimen initiated. Additionally she will have SLP for vocalization and PMV trial as well as ROM training with OT/PT.  Multiple specialists on case appreciate everyone involved.  2/7: patient is improved mildly , she's off CRRT, shes off insulin drip. She is mentating.  She was evaluated by [facility].  She was unable to vocalize with PMV.  She may need PEG tube for nourishment.  2/8: patient is overnight worsened with fever and increased levophed dose.  She is negative 1.7L.   She is on zosyn.  2/9: patient is improved this am. She is giving thumbs up during examination. 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 She seems volume depleted as  well so we will start CVP monitoring and give IV LR 1L bolus today to check for volume responsiveness. Albumin also while on CRRT. PT/OT starting today. Type and screen repeat due to plan for OR in am. On zosyn with ID following. Starting [place] heparin today.  2/10:  patient is s/p repeat surgery on RLE. She is on levophed 13mg post surgery. She is not tachycardic and able to follow some verbal communication.  2/11: patient stable overnight. Reordering PT/OT today. Wbc count slightly trending up remains on abx probably post surgical.  Remains severely anemic on CRRT and vasopressor support with tracheostomy.  2/12: patient with severe anemia today.  Reviewed with renal team for blood transfusion today.  2/13: remains critically ill, on pressors and CRRT 2/13: trach changed for a #8 cuffed distal XLT without difficulty. Wound cleaner. PER ENT 2/14: remains on pressors, CRRT, VENT, plan for PEG and WOUND vac change at bedside; received 1 unit of PRBC's due to anemia  2/15: s/p IVC filter placement due to inability to tolerate anticoagulation  2/16: plan for Trach collar trials as tolerated.  Continues on CRRT. Pt refusing nursing care and to work with PT/OT, Palliative Care re-consulted 2/17: Pt seems depressed, refusing nursing care.  Consult Psychiatry. 2/18: Dialysis filter continues to clot requiring filter changes with inability to return blood back.  Hgb 6.4 pt received 1 unit pRBC's.  Increased amiodarone frequency from 200 mg daily to bid due to pt converting in and out of atrial fibrillation with rvr  2/19: CRRT discontinued due to frequent clotting of circuit resulting in acute blood loss anemia due to inability to return blood back to pt  2/20: Pt currently off of vasopressors following iv fluid resuscitation  2/21: TCT for 30 hrs 2/22 TCT for 48 hrs, tolerated HD without pressors 2/23: General surgery discussing left AKA with pts family~pt with major stenotic complications on the ligaments and tendons resulting in difficult case for skin grafting in the future.  Vascular consulted for possible left AKA 2/25: Remains off vent and tolerating TC.  Plan for HD today.  Process for LTAC placement in process.  Pt's family is deciding on whether to pursue  amputation vs. Negative pressure dressing and local care 2/26: Required pressors for dialysis yesterday and has remained on low-dose norepinephrine.  She does acknowledge examiner but does not answer any questions shakes head "no" to everything asked, remains off ventilator, VASC CATH REMOVED FOR LINE HOLIDAY 2/28 off vent off pressors, CT ABD SHOWS ?PANCREATIC MASS, PEG TUBE DISLODGED 3/1 PEG replaced, PERM catha and CVL placed by IR, MRI ABD SHOWS PANCREATIC CYSTS BUT CANT TELL IF ITS MALIGNANT   Consults:  PCCM General Surgery  Nephrology ID  Speech  ENT   Significant Diagnostic Tests:  1/21: Chest Xray> no active cardiopulmonary process 1/21: CT left lower leg>Diffuse subcutaneous edema may represent cellulitis. No drainable fluid collection/abscess 1/21: Ultrasound lower unilateral left> no DVT  Micro Data:  1/21: SARS-CoV-2 PCR>>negative 1/21: Influenza PCR>>negative 1/21: Blood culture x2>>group A strep (strep pyogenes) 1/21: Urine Culture>negative  1/21: MRSA PCR>>negative  1/24: Left leg wound culture>>negative  2/7: [place] would culture>>few pseudomonas aeruginosa and few candida albicans  2/8: MRSA PCR>> negative 2/26: few pseudomonas aeruginosa  Antimicrobials:  Vancomycin 1/21>>1/22 Cefepime 1/21>>01/22 Clindamycin 1/22>>1/25 Ceftriaxone 1/21 X1; restarted 1/31>>2/6 Penicillin G 1/22>>1/31 Linezolid 1/25>>1/31 Metronidazole 02/6 x1 dose Unasyn 2/7>>2/7 Zosyn 2/7>>2/17  Subjective:  No acute events overnight [facility] Off pressors  OBJECTIVE  Blood pressure (!) 104/49, pulse 76, temperature 98 F (36.7 C), temperature source Axillary, resp. rate 18, height '5\' 5"'  (1.651 m), weight 117.3 kg, SpO2 95 %.    FiO2 (%):  [28 %] 28 %   Intake/Output Summary (Last 24 hours) at 06/13/2021 0731 Last data filed at 06/13/2021 0602 Gross per 24 hour  Intake 1081.9 ml  Output 1050 ml  Net 31.9 ml    Filed Weights    06/11/21 0500 06/12/21 0500 06/13/21 0442  Weight: 114.8 kg 114.5 kg 117.3 kg    REVIEW OF SYSTEMS LIMITED BASED ON PATIENTS MOOD    PHYSICAL EXAMINATION:  GENERAL:critically ill appearing,  EYES: Pupils equal, round, reactive to light.  No scleral icterus.  NECK: S/p TRACH PULMONARY: +rhonchi,  CARDIOVASCULAR: S1 and S2. GASTROINTESTINAL:  S/p PEG  NEURO lethargic today Extremities: LLE negative pressure dressing present clean dry and intact, LE wound as below      Labs   CBC: Recent Labs  Lab 06/09/21 0455 06/10/21 0508 06/11/21 0432 06/12/21 0414 06/13/21 0429  WBC 14.6* 16.3* 14.1* 13.8* 13.0*  NEUTROABS 11.0* 12.9* 11.5* 10.5* 10.5*  HGB 7.8* 7.6* 7.6* 8.0* 7.4*  HCT 24.9* 23.6* 24.5* 25.8* 23.4*  MCV 88.6 88.4 90.1 88.7 88.0  PLT 282 290 285 316 307     Basic Metabolic Panel: Recent Labs  Lab 06/09/21 0455 06/10/21 0508 06/11/21 0432 06/12/21 0414 06/13/21 0429  NA 131* 132* 132* 131* 130*  K 3.8 4.1 4.6 5.3* 4.8  CL 94* 92* 98 94* 95*  CO2 '27 24 25 25 25  ' GLUCOSE 107* 113* 141* 75 127*  BUN 70* 97* 136* 154* 157*  CREATININE 2.06* 2.81* 3.14* 3.40* 3.67*  CALCIUM 8.7* 8.9 8.8* 9.1 8.8*  MG 2.0 2.4 2.5* 2.7* 2.7*  PHOS 4.6 4.5 3.6 4.2 5.5*    GFR: Estimated Creatinine Clearance: 17.7 mL/min (A) (by C-G formula based on SCr of 3.67 mg/dL (H)). Recent Labs  Lab 06/10/21 0508 06/11/21 0432 06/12/21 0414 06/13/21 0429  PROCALCITON  --  4.11  --   --   WBC 16.3* 14.1* 13.8* 13.0*     Liver Function Tests: Recent Labs  Lab 06/07/21 0421 06/08/21 0430 06/09/21 0455 06/10/21 0508 06/11/21 0432 06/12/21 0414 06/13/21 0429  AST 81*  --   --  70*  --   --   --   ALT 118*  --   --  92*  --   --   --   ALKPHOS 193*  --   --  258*  --   --   --   BILITOT 1.6*  --   --  1.2  --   --   --   PROT 5.7*  --   --  5.5*  --   --   --   ALBUMIN 2.7*   2.8*   < > 2.2* 2.1*   2.1* 2.0* 1.9* 1.9*   < > = values in this interval not displayed.     Cardiac Enzymes: No results for input(s): CKTOTAL, CKMB, CKMBINDEX, TROPONINI in the last 168 hours.  HbA1C: Hgb A1c MFr Bld  Date/Time Value Ref Range Status  05/05/2021 04:53 AM 6.7 (H) 4.8 - 5.6 % Final    Comment:    (NOTE)         Prediabetes: 5.7 - 6.4         Diabetes: >6.4         Glycemic control for adults with diabetes: <  7.0   02/11/2021 09:41 AM 6.3 (H) <5.7 % of total Hgb Final    Comment:    For someone without known diabetes, a hemoglobin  A1c value between 5.7% and 6.4% is consistent with prediabetes and should be confirmed with a  follow-up test. . For someone with known diabetes, a value <7% indicates that their diabetes is well controlled. A1c targets should be individualized based on duration of diabetes, age, comorbid conditions, and other considerations. . This assay result is consistent with an increased risk of diabetes. . Currently, no consensus exists regarding use of hemoglobin A1c for diagnosis of diabetes for children. .     CBG: Recent Labs  Lab 06/12/21 1742 06/12/21 1918 06/12/21 2214 06/13/21 0030 06/13/21 0333  GLUCAP 88 94 111* 86 123*     Allergies No Known Allergies    Scheduled Meds:  amiodarone  200 mg Per Tube BID   vitamin C  500 mg Per Tube BID   chlorhexidine gluconate (MEDLINE KIT)  15 mL Mouth Rinse BID   Chlorhexidine Gluconate Cloth  6 each Topical Q0600   cholecalciferol  2,000 Units Per Tube Daily   collagenase   Topical Daily   Darbepoetin Alfa  100 mcg Subcutaneous Q7 days   escitalopram  10 mg Per Tube Daily   free water  30 mL Per Tube Q4H   ipratropium-albuterol  3 mL Nebulization TID   lactobacillus  1 g Per Tube TID WC   lip balm   Topical TID   mouth rinse  15 mL Mouth Rinse 10 times per day   metoCLOPramide (REGLAN) injection  5 mg Intravenous Q8H   midodrine  15 mg Per Tube TID WC   multivitamin  1 tablet Per Tube QHS   nutrition supplement (JUVEN)  1 packet Per Tube BID BM    pantoprazole sodium  40 mg Per Tube Daily   sodium chloride flush  10-40 mL Intracatheter Q12H   valACYclovir  500 mg Per Tube Daily   vitamin A  10,000 Units Per Tube Daily   vitamin E  400 Units Per Tube Daily   Continuous Infusions:  sodium chloride Stopped (06/03/21 0902)   sodium chloride Stopped (06/12/21 0903)   copper chloride IVPB 2 mg (06/12/21 0903)   dextrose 20 mL/hr at 06/13/21 0602   feeding supplement (PIVOT 1.5 CAL) 20 mL/hr at 06/13/21 0000   norepinephrine (LEVOPHED) Adult infusion Stopped (06/12/21 1745)   PRN Meds:.sodium chloride, acetaminophen, albuterol, artificial tears, clonazePAM, heparin, HYDROmorphone (DILAUDID) injection, iohexol, loperamide, midazolam, ondansetron (ZOFRAN) IV, oxyCODONE, sennosides, sodium chloride flush  Assessment & Plan:     Admitted for severe septic shock with  streptococcal toxic shock LLE NECROTIZING fascitis complicated by acute renal failure and hypoixc resp failure failure to wean from vent ended up with s/p TRACH and s/p PEG tube complicated with progressive end stage renal disease with dislodged PEG tube and 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    Respiratory Failure s/p tracheostomy  tolerating TCT Severe sepsis with streptococcal toxic shock was precipitating factor Weaned off of vent Hx: COPD, OSA, and Obesity TCT as tolerated - Continue trach collar - Intermittent chest x-ray & ABG PRN - Ensure adequate pulmonary hygiene     Intermittent atrial fibrillation with rvr~RESOLVED  - Continuous cardiac monitoring - Continue scheduled midodrine   - Continue amiodarone   Left necrotizing fascitis (strep pyogenes)-present on admission s/p multiple debridements, application of wound VAC system, s/p myriad matrix placement 05/17/2021 -myriad matrix failed Streptococcal toxic shock  syndrome Pseudomonas in Tracheal wound culture 2/7  - Monitor fever curve - General Surgery following, appreciate input ~ wound care as per surgery -  Vascular Surgery consulted for possible left AKA awaiting pts family decision regarding this FAMILY DOES NOT ANT AMPUTATION AT THIS TIME PERM CATH AND CVL placed by IR 3/1   ACUTE KIDNEY INJURY/Renal Failure -continue Foley Catheter-assess need -Avoid nephrotoxic agents -Follow urine output, BMP -Ensure adequate renal perfusion, optimize oxygenation -Renal dose medications   Intake/Output Summary (Last 24 hours) at 06/13/2021 0734 Last data filed at 06/13/2021 0602 Gross per 24 hour  Intake 1081.9 ml  Output 1050 ml  Net 31.9 ml     ACUTE ANEMIA- TRANSFUSE AS NEEDED CONSIDER TRANSFUSION  IF HGB<7 DVT PRX with TED/SCD's ONLY -Unable to tolerate anticoagulants or SCD's for VTE Prophylaxis ~  IVC filter placed 9/29   Toxic metabolic encephalopathy~improving  Minor SAH  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  NO ANTICOAGULATION FOR  1 MONTH AND THEN NEED TO RE-ASSESS NO ANTIPLATELET AGENTS FOR 1 MONTH AND THEN NEED TO RE-ASSESS CAN POSSIBLY CONSIDER BABY ASA BUT WOULD NEED TO PROCEED WITH CAUTION -Continue Lexapro -Provide supportive care -Promote normal sleep/wake cycle -Promote family presence -Avoid sedating meds as able  ENDO - ICU hypoglycemic\Hyperglycemia protocol -check FSBS per protocol   GI GI PROPHYLAXIS as indicated  NUTRITIONAL STATUS DIET-->TF's as tolerated Constipation protocol as indicated   ELECTROLYTES -follow labs as needed -replace as needed -pharmacy consultation and following      GI Moderate malnutrition Sarcopenic obesity Debility/deconditioning Malpositioned PEG tube-re-inserted by IR ?malignant cyctic mass Will need input form SURGERY/GI   Best practice:  Diet: Tube Feeds  Pain/Anxiety/Delirium protocol (if indicated): scheduled and prn oxycodone  VAP protocol (if indicated): Ordered  DVT prophylaxis: Unable to tolerate chemical prophylaxis s/p IVC filter placement  GI prophylaxis: PPI Glucose control:  SSI  Yes Central venous access: N/A Arterial line:  N/A Foley:  N/A Mobility:  bed rest  PT consulted: N/A Last date of multidisciplinary goals of care discussion [06/11/21] Code Status:  full code Disposition: SDU   Prognosis is very poor To be discharged to [facility] once work up complete    Corrin Parker, M.D.  [facility]  Medical Director [place] Director [place] Department

## 2021-06-13 NOTE — Progress Notes (Signed)
Patient ID: Fayrene Towner, female   DOB: 06-25-1950, 71 y.o.   MRN: [id number] ?    SURGICAL PROGRESS NOTE  ? ?Hospital Day(s): 40.  ? ?Interval History: Patient seen and examined, no acute events or new complaints overnight.  There has been no issue with negative pressure dressing since last change on Monday.  No clinical deterioration. ? ?Vital signs in last 24 hours: [min-max] current  ?Temp:  [97.6 ?F (36.4 ?C)-98.7 ?F (37.1 ?C)] 98.7 ?F (37.1 ?C) (03/02 0745) ?Pulse Rate:  [67-88] 88 (03/02 1000) ?Resp:  [16-31] 31 (03/02 1000) ?BP: (82-151)/(33-115) 106/53 (03/02 1000) ?SpO2:  [92 %-99 %] 96 % (03/02 1000) ?FiO2 (%):  [28 %] 28 % (03/02 0828) ?Weight:  [117.3 kg] 117.3 kg (03/02 0442)     Height: _0  (165.1 cm) Weight: 117.3 kg BMI (Calculated): 43.03  ? ?Physical Exam:  ?Constitutional: alert, cooperative and no distress  ?Respiratory: Left leg with adequate granulation tissue over the muscle.  Minimal healing tissue over the tendons.  No necrotic tissue.  No purulence.  No sign of infection. ? ? ? ? ? ? ?Labs:  ?CBC Latest Ref Rng & Units 06/13/2021 06/12/2021 06/11/2021  ?WBC 4.0 - 10.5 K/uL 13.0(H) 13.8(H) 14.1(H)  ?Hemoglobin 12.0 - 15.0 g/dL 7.4(L) 8.0(L) 7.6(L)  ?Hematocrit 36.0 - 46.0 % 23.4(L) 25.8(L) 24.5(L)  ?Platelets 150 - 400 K/uL 307 316 285  ? ?CMP Latest Ref Rng & Units 06/13/2021 06/12/2021 06/11/2021  ?Glucose 70 - 99 mg/dL 127(H) 75 141(H)  ?BUN 8 - 23 mg/dL 157(H) 154(H) 136(H)  ?Creatinine 0.44 - 1.00 mg/dL 3.67(H) 3.40(H) 3.14(H)  ?Sodium 135 - 145 mmol/L 130(L) 131(L) 132(L)  ?Potassium 3.5 - 5.1 mmol/L 4.8 5.3(H) 4.6  ?Chloride 98 - 111 mmol/L 95(L) 94(L) 98  ?CO2 22 - 32 mmol/L _1 ?Calcium 8.9 - 10.3 mg/dL 8.8(L) 9.1 8.8(L)  ?Total Protein 6.5 - 8.1 g/dL - - -  ?Total Bilirubin 0.3 - 1.2 mg/dL - - -  ?Alkaline Phos 38 - 126 U/L - - -  ?AST 15 - 41 U/L - - -  ?ALT 0 - 44 U/L - - -  ? ? ?Imaging studies: I personally evaluated the images of the gastrostomy tube exchange.  Gastrostomy  tube in place. ? ? ?Assessment/Plan:  ?71 y.o. female with left leg necrotizing fasciitis with 30 Day Post-Op s/p debridement, complicated by pertinent comorbidities including strep pyogenes bacteremia now in septic shock (resolved), respiratory failure mechanical ventilation (resolved), liver failure (resolved), acute renal failure on CRRT, COPD, type 2 diabetes mellitus, hypertension, obstructive sleep apnea, GERD. ?  ?Left leg necrotizing fasciitis ?-S/p debridement 05/07/2021 ?-Second debridement on 05/10/2021.  Application of wound VAC system ?-S/p myriad matrix placement on 05/17/2021 ?-The Myriad matrix did not incorporated. Most of the matrix removed today.  ? ?-I discussed case with plastic surgeon for further recommendation of complex wound care.  As per plastic surgery recommendations patient has 2 options.  Option 1 if patient and family members would like to preserve the leg patient will need multiple surgical procedures in the operating room for serial debridements and placement of collagen matrix to be able to create granulation tissue over the tendons.  This means he will be in the hospital for another month or 2 with serial trips to the operating room. ?-Option to is to proceed with amputation for a faster recovery and no need of complex wound care. ? ?-Upon discussion with the family they endorses here that they  do not want the patient to have a complex treatments including more operations.  They are highly considering amputation due to being faster recovery and no need of multiple trips to the operating room. ? ?Since they have not decided yet I will continue to do the negative pressure dressing changes. ? ?Today I perform wound VAC change personally. ? ?What I do to change this complex negative pressure dressing is that after giving patient Dilaudid for pain management,  I remove the tape and irrigate the sponges with saline this make the procedure less painful.  Carefully I remove the black foam.   Then I remove the Mepitel.  Then I irrigated with saline the whole wound.  Then I placed 2 large sheet Mepitel that covers 96% of the wound circumferentially.  Then I cut 4 of the large black foam in a way that I have 4 squares and the extra foam I used to fill the empty space.  Once I know that the whole wound is good to be covered by the foam I wrap the dressing with sterile drape or Ioban.  Make the hole to put the suction catheter.  I applied pressure on the ankle area to get adequate saline.  Then I used the wound VAC tapes to reinforce any area of leakage of the suction.  This is usually on the ankle/foot area.  Adequate saline is confirmed by the machine. ? ?Gwendolyn Grant, MD ? ? ? ?

## 2021-06-13 NOTE — Progress Notes (Signed)
Clot in venous line, lines rinsed back, and patient tolerated well. New dialysis lines connected and treatment resumed. Roque Cash made aware ?

## 2021-06-13 NOTE — Progress Notes (Signed)
[facility]  PROGRESS NOTE   Subjective:   No family at bedside Eyes open and tracking   UOP 768m  Creatinine 3.67 with GFR 13 BUN 157  Wbc 13.0 (13.8) (14.1)  midodrine  Objective:  Vital signs in last 24 hours:  Temp:  [97.6 F (36.4 C)-98.7 F (37.1 C)] 98.7 F (37.1 C) (03/02 1159) Pulse Rate:  [67-95] 89 (03/02 1345) Resp:  [16-31] 25 (03/02 1345) BP: (82-151)/(33-115) 111/63 (03/02 1345) SpO2:  [92 %-100 %] 98 % (03/02 1345) FiO2 (%):  [28 %] 28 % (03/02 0828) Weight:  [117.3 kg] 117.3 kg (03/02 1249)  Weight change: 2.8 kg Filed Weights   06/12/21 0500 06/13/21 0442 06/13/21 1249  Weight: 114.5 kg 117.3 kg 117.3 kg    Intake/Output: I/O last 3 completed shifts: In: 1608.5 [I.V.:1180.3; Other:60; NG/GT:273.3; IV Piggyback:94.8] Out: 1600 [Urine:1150; Drains:450]   Intake/Output this shift:  Total I/O In: 355.7 [I.V.:32.2; Other:50; NG/GT:273.5] Out: 100 [Urine:100]  Physical Exam: General:  Critically ill  Head:  [place]/AT, OM moist  Eyes:  Anicteric  Neck:  tracheostomy in place  Lungs:  Scattered rhonchi, normal effort  Heart:  regular  Abdomen:   Soft, nontender  Extremities:  Left lower extremity wound vac 1+ right lower extremity edema  Neurologic:  Awake, alert, following commands  Skin:  No lesions  Access:  None    Basic Metabolic Panel: Recent Labs  Lab 06/09/21 0455 06/10/21 0508 06/11/21 0432 06/12/21 0414 06/13/21 0429  NA 131* 132* 132* 131* 130*  K 3.8 4.1 4.6 5.3* 4.8  CL 94* 92* 98 94* 95*  CO2 _0 GLUCOSE 107* 113* 141* 75 127*  BUN 70* 97* 136* 154* 157*  CREATININE 2.06* 2.81* 3.14* 3.40* 3.67*  CALCIUM 8.7* 8.9 8.8* 9.1 8.8*  MG 2.0 2.4 2.5* 2.7* 2.7*  PHOS 4.6 4.5 3.6 4.2 5.5*     CBC: Recent Labs  Lab 06/09/21 0455 06/10/21 0508 06/11/21 0432 06/12/21 0414 06/13/21 0429  WBC 14.6* 16.3* 14.1* 13.8* 13.0*  NEUTROABS 11.0* 12.9* 11.5* 10.5* 10.5*  HGB 7.8* 7.6* 7.6* 8.0* 7.4*   HCT 24.9* 23.6* 24.5* 25.8* 23.4*  MCV 88.6 88.4 90.1 88.7 88.0  PLT 282 290 285 316 307      Urinalysis: No results for input(s): COLORURINE, LABSPEC, PHURINE, GLUCOSEU, HGBUR, BILIRUBINUR, KETONESUR, PROTEINUR, UROBILINOGEN, NITRITE, LEUKOCYTESUR in the last 72 hours.  Invalid input(s): APPERANCEUR    Imaging: CT ABDOMEN PELVIS WO CONTRAST  Result Date: 06/11/2021 CLINICAL DATA:  Nausea, vomiting EXAM: CT ABDOMEN AND PELVIS WITHOUT CONTRAST TECHNIQUE: Multidetector CT imaging of the abdomen and pelvis was performed following the standard protocol without IV contrast. RADIATION DOSE REDUCTION: This exam was performed according to the departmental dose-optimization program which includes automated exposure control, adjustment of the mA and/or kV according to patient size and/or use of iterative reconstruction technique. COMPARISON:  None. FINDINGS: Lower chest: Trace bilateral pleural effusions. Bilateral lower lobe airspace disease concerning for pneumonia. Hepatobiliary: Nodular contours of the liver compatible with cirrhosis. Cysts 1.7 cm gallstone within the gallbladder. No biliary ductal dilatation. Pancreas: Concern for possible low-density lesion in the pancreatic head measuring 2.5 cm. This is difficult to visualized/evaluated on this noncontrast study. No ductal dilatation. Spleen: No focal abnormality.  Normal size. Adrenals/Urinary Tract: 6 mm nonobstructing stone in the midpole of the right kidney. No hydronephrosis or ureteral stones. Adrenal glands unremarkable. No renal mass. Urinary bladder unremarkable. Stomach/Bowel: Rectal tube in the rectum. Colonic diverticulosis.  No active diverticulitis. Gastrostomy tube in place. The retention balloon or button is outside the stomach in the anterior abdominal wall. Stomach and small bowel decompressed, unremarkable. Normal appendix. Vascular/Lymphatic: Aortic atherosclerosis.  IVC filter in place. Reproductive: Prior hysterectomy.  No  adnexal masses. Other: No free fluid or free air. Musculoskeletal: No acute bony abnormality. IMPRESSION: Changes of cirrhosis. Small bilateral pleural effusions. Bilateral lower lobe opacities concerning for pneumonia. Cholelithiasis. Colonic diverticulosis.  No active diverticulitis. Gastrostomy tube retention balloon or button is outside the stomach, within the anterior abdominal wall. Questionable low-density 2.5 cm mass in the pancreatic head. This is difficult to visualize without IV contrast. When the patient is clinically stable and able to follow directions and hold their breath (preferably as an outpatient) further evaluation with dedicated abdominal MRI should be considered. Electronically Signed   By: Rolm Baptise M.D.   On: 06/11/2021 17:17   MR 3D Recon At Scanner  Result Date: 06/13/2021 CLINICAL DATA:  Pancreatic mass. EXAM: MRI ABDOMEN WITHOUT AND WITH CONTRAST (INCLUDING MRCP) TECHNIQUE: Multiplanar multisequence MR imaging of the abdomen was performed both before and after the administration of intravenous contrast. Heavily T2-weighted images of the biliary and pancreatic ducts were obtained, and three-dimensional MRCP images were rendered by post processing. CONTRAST:  63m GADAVIST GADOBUTROL 1 MMOL/ML IV SOLN COMPARISON:  CT abdomen pelvis 06/11/2021 FINDINGS: Patient has a tracheostomy tube and is unable to reproducibly breath hold. Free breathing technique used during the scan which results in respiratory motion. Lower chest: Bilateral pleural effusions with bibasilar collapse/consolidation. Hepatobiliary: Liver measures 19.8 cm craniocaudal length. Nodular liver contour is compatible with cirrhosis. No foci of restricted diffusion within the liver parenchyma. Postcontrast imaging is substantially motion degraded but no discernible arterial phase hyperenhancement. There is no evidence for gallstones, gallbladder wall thickening, or pericholecystic fluid. No intrahepatic or extrahepatic  biliary dilation. Pancreas: 4.2 x 5.6 x 3.1 cm multilocular cystic lesion is identified in the inferior head of pancreas/uncinate process. Cystic component show intermediate to low signal intensity on T1 imaging with intermediate signal intensity on T2 imaging. There are intervening enhancing septations between the different locules. No dilatation of the main pancreatic duct. Spleen: Assessment substantially limited on postcontrast imaging due to motion artifact. Small intermediate T2 hyperintensities without enhancement after IV contrast administration, likely small cysts or pseudocyst. Adrenals/Urinary Tract: No adrenal nodule or mass. Kidneys unremarkable. Stomach/Bowel: Gastrostomy tube noted. No gastric wall thickening. No evidence of outlet obstruction. No small bowel or colonic dilatation within the visualized abdomen. Vascular/Lymphatic: No abdominal aortic aneurysm. No abdominal lymphadenopathy. Other: No substantial intraperitoneal free fluid. Small cystic focus in the splenic flexure is stable since prior CT. Musculoskeletal: No focal suspicious marrow enhancement within the visualized bony anatomy. IMPRESSION: 1. 4.2 x 5.6 x 3.1 cm multilocular cystic lesion in the inferior head of pancreas/uncinate process. There are intervening enhancing septations between the different locules. No dilatation of the main pancreatic duct. Pancreatic pseudocyst would be a consideration. IPMN also consideration. Macrocystic appearance can be seen in the setting of mucinous cystic tumor of the pancreas although this lesion is typically located in the tail of pancreas. Imaging features not characteristic for serous cystadenoma. Endoscopic ultrasound would likely prove helpful to further characterize and allow tissue sampling as appropriate. 2. Nodular liver contour compatible with cirrhosis. No foci of restricted diffusion or arterial phase hyperenhancement within the liver parenchyma. 3. Bilateral pleural effusions with  bibasilar collapse/consolidation. Electronically Signed   By: EMisty StanleyM.D.   On: 06/13/2021 06:10  IR Fluoro Guide CV Line Right  Result Date: 06/12/2021 Criselda Peaches, MD     06/12/2021  5:07 PM Interventional Radiology Procedure Note Procedure: 1.) Placement of a right IJ 19 cm tunneled HD catheter, Tips in the RA and ready for use. 2.) Placement of a right IJ triple lumen CVC.  Catheter tip in the RA. 3.) Exchange of malpositioned g-tube for new 59F g-tube.  Ready for use. Complications: None Estimated Blood Loss: None Recommendations: - Routine line care - May resume use of g-tube Signed, Criselda Peaches, MD   IR Replc Gastro/Colonic Tube Percut W/Fluoro  Result Date: 06/12/2021 Criselda Peaches, MD     06/12/2021  5:07 PM Interventional Radiology Procedure Note Procedure: 1.) Placement of a right IJ 19 cm tunneled HD catheter, Tips in the RA and ready for use. 2.) Placement of a right IJ triple lumen CVC.  Catheter tip in the RA. 3.) Exchange of malpositioned g-tube for new 59F g-tube.  Ready for use. Complications: None Estimated Blood Loss: None Recommendations: - Routine line care - May resume use of g-tube Signed, Criselda Peaches, MD   MR ABDOMEN MRCP W WO CONTAST  Result Date: 06/13/2021 CLINICAL DATA:  Pancreatic mass. EXAM: MRI ABDOMEN WITHOUT AND WITH CONTRAST (INCLUDING MRCP) TECHNIQUE: Multiplanar multisequence MR imaging of the abdomen was performed both before and after the administration of intravenous contrast. Heavily T2-weighted images of the biliary and pancreatic ducts were obtained, and three-dimensional MRCP images were rendered by post processing. CONTRAST:  71m GADAVIST GADOBUTROL 1 MMOL/ML IV SOLN COMPARISON:  CT abdomen pelvis 06/11/2021 FINDINGS: Patient has a tracheostomy tube and is unable to reproducibly breath hold. Free breathing technique used during the scan which results in respiratory motion. Lower chest: Bilateral pleural effusions with bibasilar  collapse/consolidation. Hepatobiliary: Liver measures 19.8 cm craniocaudal length. Nodular liver contour is compatible with cirrhosis. No foci of restricted diffusion within the liver parenchyma. Postcontrast imaging is substantially motion degraded but no discernible arterial phase hyperenhancement. There is no evidence for gallstones, gallbladder wall thickening, or pericholecystic fluid. No intrahepatic or extrahepatic biliary dilation. Pancreas: 4.2 x 5.6 x 3.1 cm multilocular cystic lesion is identified in the inferior head of pancreas/uncinate process. Cystic component show intermediate to low signal intensity on T1 imaging with intermediate signal intensity on T2 imaging. There are intervening enhancing septations between the different locules. No dilatation of the main pancreatic duct. Spleen: Assessment substantially limited on postcontrast imaging due to motion artifact. Small intermediate T2 hyperintensities without enhancement after IV contrast administration, likely small cysts or pseudocyst. Adrenals/Urinary Tract: No adrenal nodule or mass. Kidneys unremarkable. Stomach/Bowel: Gastrostomy tube noted. No gastric wall thickening. No evidence of outlet obstruction. No small bowel or colonic dilatation within the visualized abdomen. Vascular/Lymphatic: No abdominal aortic aneurysm. No abdominal lymphadenopathy. Other: No substantial intraperitoneal free fluid. Small cystic focus in the splenic flexure is stable since prior CT. Musculoskeletal: No focal suspicious marrow enhancement within the visualized bony anatomy. IMPRESSION: 1. 4.2 x 5.6 x 3.1 cm multilocular cystic lesion in the inferior head of pancreas/uncinate process. There are intervening enhancing septations between the different locules. No dilatation of the main pancreatic duct. Pancreatic pseudocyst would be a consideration. IPMN also consideration. Macrocystic appearance can be seen in the setting of mucinous cystic tumor of the pancreas  although this lesion is typically located in the tail of pancreas. Imaging features not characteristic for serous cystadenoma. Endoscopic ultrasound would likely prove helpful to further characterize and allow tissue sampling as  appropriate. 2. Nodular liver contour compatible with cirrhosis. No foci of restricted diffusion or arterial phase hyperenhancement within the liver parenchyma. 3. Bilateral pleural effusions with bibasilar collapse/consolidation. Electronically Signed   By: Misty Stanley M.D.   On: 06/13/2021 06:10   IR TUNNELED CENTRAL VENOUS CATHETER PLACEMENT  Result Date: 06/12/2021 Criselda Peaches, MD     06/12/2021  5:07 PM Interventional Radiology Procedure Note Procedure: 1.) Placement of a right IJ 19 cm tunneled HD catheter, Tips in the RA and ready for use. 2.) Placement of a right IJ triple lumen CVC.  Catheter tip in the RA. 3.) Exchange of malpositioned g-tube for new 6F g-tube.  Ready for use. Complications: None Estimated Blood Loss: None Recommendations: - Routine line care - May resume use of g-tube Signed, Criselda Peaches, MD     Medications:    sodium chloride Stopped (06/03/21 0902)   sodium chloride Stopped (06/12/21 0903)   albumin human     copper chloride IVPB 2 mg (06/13/21 1114)   feeding supplement (PIVOT 1.5 CAL) 1,000 mL (06/13/21 0738)   norepinephrine (LEVOPHED) Adult infusion Stopped (06/12/21 1745)    amiodarone  200 mg Per Tube BID   vitamin C  500 mg Per Tube BID   chlorhexidine gluconate (MEDLINE KIT)  15 mL Mouth Rinse BID   Chlorhexidine Gluconate Cloth  6 each Topical Q0600   cholecalciferol  2,000 Units Per Tube Daily   collagenase   Topical Daily   Darbepoetin Alfa  100 mcg Subcutaneous Q7 days   escitalopram  10 mg Per Tube Daily   free water  30 mL Per Tube Q4H   insulin aspart  0-20 Units Subcutaneous Q4H   ipratropium-albuterol  3 mL Nebulization TID   lactobacillus  1 g Per Tube TID WC   lip balm   Topical TID   mouth rinse   15 mL Mouth Rinse 10 times per day   metoCLOPramide (REGLAN) injection  5 mg Intravenous Q8H   midodrine  15 mg Per Tube TID WC   multivitamin  1 tablet Per Tube QHS   nutrition supplement (JUVEN)  1 packet Per Tube BID BM   pantoprazole sodium  40 mg Per Tube Daily   sodium chloride flush  10-40 mL Intracatheter Q12H   valACYclovir  500 mg Per Tube Daily   vitamin A  10,000 Units Per Tube Daily   vitamin E  400 Units Per Tube Daily    Assessment/ Plan:     Principal Problem:   Severe sepsis with septic shock (CODE) ([place]) Active Problems:   COPD (chronic obstructive pulmonary disease) (HCC)   Morbid obesity ([place])   Diabetes mellitus type 2 in obese ([place])   Atrial fibrillation and flutter ([place])   Necrotizing fasciitis ([place])   Acute and chronic respiratory failure (acute-on-chronic) ([place])   On mechanically assisted ventilation ([place])   Shock liver   Anemia associated with acute blood loss   Metabolic acidosis   Encounter for continuous renal replacement therapy (CRRT) for acute renal failure (HCC)   Acute encephalopathy   Group A streptococcal infection   Streptococcal toxic shock syndrome ([place])   Status post tracheostomy ([place])   Subdural hemorrhage (HCC)   Left leg cellulitis   Abdominal distension  Alexandra Foster is a 71 y.o. black female with COPD/asthma, diabetes mellitus type II, hypertension, obstructive sleep apnea, paroxysmal atrial fibrillation, GERD, who presents to [facility] on 05/04/2021 for AKI (acute kidney injury) ([place]) [N17.9] Left leg  cellulitis [B34.356] Severe sepsis ([place]) [A41.9, R65.20] Severe sepsis with lactic acidosis ([place]) [A41.9, R65.20, E87.20] Found to have necrotizing fascitis requiring multiple debridements by Dr. Peyton Najjar: 1/24. 1/27, 2/3 and 2/10.    #1: Acute kidney injury: AKI is secondary to ischemic nephropathy secondary to hypotension complicated with sepsis. History of bland urine. Baseline creatinine of 0.98, GFR > 60 on 02/11/21.   Patient was on CRRT for prolonged period of time.  Transitioned to hemodialysis 06/04/2021. Appreciate PermCath placement yesterday.  Plan to dialyze patient later today.  Low UF and BFR.  Will order albumin 25 g for BP support.  Next treatment scheduled for Saturday.   #2: Sepsis and hypotension: necrotizing fasciitis for left lower extremity status post multiple debridement.  Requiring vasopressors: norepinephrine. On stress dose steroids and midodrine -  Patient remains hemodynamically fragile, required pressors with dialysis .       -Scheduled MIdodrine  #3. Anemia with Acute kidney injury:  Lab Results  Component Value Date   HGB 7.4 (L) 06/13/2021    S/p multiple blood transfusions this admission Aranesp weekly SQ (mon) Continues to have blood loss through the wound VAC.     #4. Acute Respiratory Failure. Status post tracheostomy.  Continues to do well off the ventilator    Possible discharge to [facility] later today    LOS: [place], NP [facility] 3/2/20231:51 PM

## 2021-06-13 NOTE — Progress Notes (Signed)
1615 report called to Audelia Acton RN at [facility] for patient to be transferred at [facility] today  ?

## 2021-06-14 DIAGNOSIS — I480 Paroxysmal atrial fibrillation: Secondary | ICD-10-CM | POA: Diagnosis not present

## 2021-06-14 DIAGNOSIS — E119 Type 2 diabetes mellitus without complications: Secondary | ICD-10-CM | POA: Diagnosis not present

## 2021-06-14 DIAGNOSIS — R1311 Dysphagia, oral phase: Secondary | ICD-10-CM | POA: Diagnosis not present

## 2021-06-14 DIAGNOSIS — D649 Anemia, unspecified: Secondary | ICD-10-CM | POA: Diagnosis not present

## 2021-06-14 DIAGNOSIS — K3184 Gastroparesis: Secondary | ICD-10-CM | POA: Diagnosis not present

## 2021-06-14 DIAGNOSIS — N2581 Secondary hyperparathyroidism of renal origin: Secondary | ICD-10-CM | POA: Diagnosis not present

## 2021-06-14 DIAGNOSIS — K2100 Gastro-esophageal reflux disease with esophagitis, without bleeding: Secondary | ICD-10-CM | POA: Diagnosis not present

## 2021-06-14 DIAGNOSIS — J9601 Acute respiratory failure with hypoxia: Secondary | ICD-10-CM | POA: Diagnosis not present

## 2021-06-14 DIAGNOSIS — J9621 Acute and chronic respiratory failure with hypoxia: Secondary | ICD-10-CM | POA: Diagnosis not present

## 2021-06-14 DIAGNOSIS — G9341 Metabolic encephalopathy: Secondary | ICD-10-CM | POA: Diagnosis not present

## 2021-06-14 DIAGNOSIS — S81802A Unspecified open wound, left lower leg, initial encounter: Secondary | ICD-10-CM | POA: Diagnosis not present

## 2021-06-14 DIAGNOSIS — T8189XA Other complications of procedures, not elsewhere classified, initial encounter: Secondary | ICD-10-CM | POA: Diagnosis not present

## 2021-06-14 DIAGNOSIS — F1120 Opioid dependence, uncomplicated: Secondary | ICD-10-CM | POA: Diagnosis not present

## 2021-06-14 DIAGNOSIS — S066X0A Traumatic subarachnoid hemorrhage without loss of consciousness, initial encounter: Secondary | ICD-10-CM | POA: Diagnosis not present

## 2021-06-14 DIAGNOSIS — M726 Necrotizing fasciitis: Secondary | ICD-10-CM | POA: Diagnosis not present

## 2021-06-14 DIAGNOSIS — I9589 Other hypotension: Secondary | ICD-10-CM | POA: Diagnosis not present

## 2021-06-14 DIAGNOSIS — I10 Essential (primary) hypertension: Secondary | ICD-10-CM | POA: Diagnosis not present

## 2021-06-14 DIAGNOSIS — L03116 Cellulitis of left lower limb: Secondary | ICD-10-CM | POA: Diagnosis not present

## 2021-06-14 DIAGNOSIS — R6520 Severe sepsis without septic shock: Secondary | ICD-10-CM | POA: Diagnosis not present

## 2021-06-14 DIAGNOSIS — J9690 Respiratory failure, unspecified, unspecified whether with hypoxia or hypercapnia: Secondary | ICD-10-CM | POA: Diagnosis not present

## 2021-06-14 DIAGNOSIS — N179 Acute kidney failure, unspecified: Secondary | ICD-10-CM | POA: Diagnosis not present

## 2021-06-15 DIAGNOSIS — G0490 Encephalitis and encephalomyelitis, unspecified: Secondary | ICD-10-CM | POA: Diagnosis not present

## 2021-06-15 DIAGNOSIS — R0902 Hypoxemia: Secondary | ICD-10-CM | POA: Diagnosis not present

## 2021-06-15 DIAGNOSIS — I4891 Unspecified atrial fibrillation: Secondary | ICD-10-CM | POA: Diagnosis not present

## 2021-06-15 DIAGNOSIS — J9621 Acute and chronic respiratory failure with hypoxia: Secondary | ICD-10-CM | POA: Diagnosis not present

## 2021-06-15 DIAGNOSIS — R6520 Severe sepsis without septic shock: Secondary | ICD-10-CM | POA: Diagnosis not present

## 2021-06-15 DIAGNOSIS — R1310 Dysphagia, unspecified: Secondary | ICD-10-CM | POA: Diagnosis not present

## 2021-06-15 DIAGNOSIS — I480 Paroxysmal atrial fibrillation: Secondary | ICD-10-CM | POA: Diagnosis not present

## 2021-06-15 DIAGNOSIS — G9341 Metabolic encephalopathy: Secondary | ICD-10-CM | POA: Diagnosis not present

## 2021-06-16 DIAGNOSIS — R1310 Dysphagia, unspecified: Secondary | ICD-10-CM | POA: Diagnosis not present

## 2021-06-16 DIAGNOSIS — R6520 Severe sepsis without septic shock: Secondary | ICD-10-CM | POA: Diagnosis not present

## 2021-06-16 DIAGNOSIS — K2100 Gastro-esophageal reflux disease with esophagitis, without bleeding: Secondary | ICD-10-CM | POA: Diagnosis not present

## 2021-06-16 DIAGNOSIS — G9341 Metabolic encephalopathy: Secondary | ICD-10-CM | POA: Diagnosis not present

## 2021-06-16 DIAGNOSIS — M726 Necrotizing fasciitis: Secondary | ICD-10-CM | POA: Diagnosis not present

## 2021-06-16 DIAGNOSIS — J9621 Acute and chronic respiratory failure with hypoxia: Secondary | ICD-10-CM | POA: Diagnosis not present

## 2021-06-16 DIAGNOSIS — I480 Paroxysmal atrial fibrillation: Secondary | ICD-10-CM | POA: Diagnosis not present

## 2021-06-16 DIAGNOSIS — K3184 Gastroparesis: Secondary | ICD-10-CM | POA: Diagnosis not present

## 2021-06-16 DIAGNOSIS — I4891 Unspecified atrial fibrillation: Secondary | ICD-10-CM | POA: Diagnosis not present

## 2021-06-16 DIAGNOSIS — G0490 Encephalitis and encephalomyelitis, unspecified: Secondary | ICD-10-CM | POA: Diagnosis not present

## 2021-06-16 DIAGNOSIS — R0902 Hypoxemia: Secondary | ICD-10-CM | POA: Diagnosis not present

## 2021-06-16 DIAGNOSIS — T8189XA Other complications of procedures, not elsewhere classified, initial encounter: Secondary | ICD-10-CM | POA: Diagnosis not present

## 2021-06-16 DIAGNOSIS — R1311 Dysphagia, oral phase: Secondary | ICD-10-CM | POA: Diagnosis not present

## 2021-06-16 DIAGNOSIS — L03116 Cellulitis of left lower limb: Secondary | ICD-10-CM | POA: Diagnosis not present

## 2021-06-17 DIAGNOSIS — N2581 Secondary hyperparathyroidism of renal origin: Secondary | ICD-10-CM | POA: Diagnosis not present

## 2021-06-17 DIAGNOSIS — I480 Paroxysmal atrial fibrillation: Secondary | ICD-10-CM | POA: Diagnosis not present

## 2021-06-17 DIAGNOSIS — E119 Type 2 diabetes mellitus without complications: Secondary | ICD-10-CM | POA: Diagnosis not present

## 2021-06-17 DIAGNOSIS — J9601 Acute respiratory failure with hypoxia: Secondary | ICD-10-CM | POA: Diagnosis not present

## 2021-06-17 DIAGNOSIS — M6281 Muscle weakness (generalized): Secondary | ICD-10-CM | POA: Diagnosis not present

## 2021-06-17 DIAGNOSIS — M726 Necrotizing fasciitis: Secondary | ICD-10-CM | POA: Diagnosis not present

## 2021-06-17 DIAGNOSIS — J9690 Respiratory failure, unspecified, unspecified whether with hypoxia or hypercapnia: Secondary | ICD-10-CM | POA: Diagnosis not present

## 2021-06-17 DIAGNOSIS — I9589 Other hypotension: Secondary | ICD-10-CM | POA: Diagnosis not present

## 2021-06-17 DIAGNOSIS — Z9911 Dependence on respirator [ventilator] status: Secondary | ICD-10-CM | POA: Diagnosis not present

## 2021-06-17 DIAGNOSIS — S066X0A Traumatic subarachnoid hemorrhage without loss of consciousness, initial encounter: Secondary | ICD-10-CM | POA: Diagnosis not present

## 2021-06-17 DIAGNOSIS — F1120 Opioid dependence, uncomplicated: Secondary | ICD-10-CM | POA: Diagnosis not present

## 2021-06-17 DIAGNOSIS — D649 Anemia, unspecified: Secondary | ICD-10-CM | POA: Diagnosis not present

## 2021-06-17 DIAGNOSIS — R2689 Other abnormalities of gait and mobility: Secondary | ICD-10-CM | POA: Diagnosis not present

## 2021-06-17 DIAGNOSIS — R4189 Other symptoms and signs involving cognitive functions and awareness: Secondary | ICD-10-CM | POA: Diagnosis not present

## 2021-06-17 DIAGNOSIS — R5381 Other malaise: Secondary | ICD-10-CM | POA: Diagnosis not present

## 2021-06-17 DIAGNOSIS — S81802A Unspecified open wound, left lower leg, initial encounter: Secondary | ICD-10-CM | POA: Diagnosis not present

## 2021-06-17 DIAGNOSIS — I10 Essential (primary) hypertension: Secondary | ICD-10-CM | POA: Diagnosis not present

## 2021-06-17 DIAGNOSIS — N179 Acute kidney failure, unspecified: Secondary | ICD-10-CM | POA: Diagnosis not present

## 2021-06-17 DIAGNOSIS — J9621 Acute and chronic respiratory failure with hypoxia: Secondary | ICD-10-CM | POA: Diagnosis not present

## 2021-06-18 DIAGNOSIS — N2581 Secondary hyperparathyroidism of renal origin: Secondary | ICD-10-CM | POA: Diagnosis not present

## 2021-06-18 DIAGNOSIS — L03116 Cellulitis of left lower limb: Secondary | ICD-10-CM | POA: Diagnosis not present

## 2021-06-18 DIAGNOSIS — M726 Necrotizing fasciitis: Secondary | ICD-10-CM | POA: Diagnosis not present

## 2021-06-18 DIAGNOSIS — J9601 Acute respiratory failure with hypoxia: Secondary | ICD-10-CM | POA: Diagnosis not present

## 2021-06-18 DIAGNOSIS — D649 Anemia, unspecified: Secondary | ICD-10-CM | POA: Diagnosis not present

## 2021-06-18 DIAGNOSIS — N179 Acute kidney failure, unspecified: Secondary | ICD-10-CM | POA: Diagnosis not present

## 2021-06-18 DIAGNOSIS — Z9911 Dependence on respirator [ventilator] status: Secondary | ICD-10-CM | POA: Diagnosis not present

## 2021-06-18 DIAGNOSIS — J9621 Acute and chronic respiratory failure with hypoxia: Secondary | ICD-10-CM | POA: Diagnosis not present

## 2021-06-18 DIAGNOSIS — S066X0D Traumatic subarachnoid hemorrhage without loss of consciousness, subsequent encounter: Secondary | ICD-10-CM | POA: Diagnosis not present

## 2021-06-18 DIAGNOSIS — R1311 Dysphagia, oral phase: Secondary | ICD-10-CM | POA: Diagnosis not present

## 2021-06-18 DIAGNOSIS — I480 Paroxysmal atrial fibrillation: Secondary | ICD-10-CM | POA: Diagnosis not present

## 2021-06-18 DIAGNOSIS — I9589 Other hypotension: Secondary | ICD-10-CM | POA: Diagnosis not present

## 2021-06-18 DIAGNOSIS — K3184 Gastroparesis: Secondary | ICD-10-CM | POA: Diagnosis not present

## 2021-06-18 DIAGNOSIS — T8189XA Other complications of procedures, not elsewhere classified, initial encounter: Secondary | ICD-10-CM | POA: Diagnosis not present

## 2021-06-18 DIAGNOSIS — K2100 Gastro-esophageal reflux disease with esophagitis, without bleeding: Secondary | ICD-10-CM | POA: Diagnosis not present

## 2021-06-19 DIAGNOSIS — E119 Type 2 diabetes mellitus without complications: Secondary | ICD-10-CM | POA: Diagnosis not present

## 2021-06-19 DIAGNOSIS — J9621 Acute and chronic respiratory failure with hypoxia: Secondary | ICD-10-CM | POA: Diagnosis not present

## 2021-06-19 DIAGNOSIS — N2581 Secondary hyperparathyroidism of renal origin: Secondary | ICD-10-CM | POA: Diagnosis not present

## 2021-06-19 DIAGNOSIS — F1120 Opioid dependence, uncomplicated: Secondary | ICD-10-CM | POA: Diagnosis not present

## 2021-06-19 DIAGNOSIS — I10 Essential (primary) hypertension: Secondary | ICD-10-CM | POA: Diagnosis not present

## 2021-06-19 DIAGNOSIS — I480 Paroxysmal atrial fibrillation: Secondary | ICD-10-CM | POA: Diagnosis not present

## 2021-06-19 DIAGNOSIS — J9690 Respiratory failure, unspecified, unspecified whether with hypoxia or hypercapnia: Secondary | ICD-10-CM | POA: Diagnosis not present

## 2021-06-19 DIAGNOSIS — Z9911 Dependence on respirator [ventilator] status: Secondary | ICD-10-CM | POA: Diagnosis not present

## 2021-06-19 DIAGNOSIS — I9589 Other hypotension: Secondary | ICD-10-CM | POA: Diagnosis not present

## 2021-06-19 DIAGNOSIS — N179 Acute kidney failure, unspecified: Secondary | ICD-10-CM | POA: Diagnosis not present

## 2021-06-19 DIAGNOSIS — S066X0D Traumatic subarachnoid hemorrhage without loss of consciousness, subsequent encounter: Secondary | ICD-10-CM | POA: Diagnosis not present

## 2021-06-19 DIAGNOSIS — S81802A Unspecified open wound, left lower leg, initial encounter: Secondary | ICD-10-CM | POA: Diagnosis not present

## 2021-06-19 DIAGNOSIS — J9601 Acute respiratory failure with hypoxia: Secondary | ICD-10-CM | POA: Diagnosis not present

## 2021-06-19 DIAGNOSIS — M726 Necrotizing fasciitis: Secondary | ICD-10-CM | POA: Diagnosis not present

## 2021-06-19 DIAGNOSIS — D649 Anemia, unspecified: Secondary | ICD-10-CM | POA: Diagnosis not present

## 2021-06-20 DIAGNOSIS — J9621 Acute and chronic respiratory failure with hypoxia: Secondary | ICD-10-CM | POA: Diagnosis not present

## 2021-06-20 DIAGNOSIS — I9589 Other hypotension: Secondary | ICD-10-CM | POA: Diagnosis not present

## 2021-06-20 DIAGNOSIS — M726 Necrotizing fasciitis: Secondary | ICD-10-CM | POA: Diagnosis not present

## 2021-06-20 DIAGNOSIS — S066X0D Traumatic subarachnoid hemorrhage without loss of consciousness, subsequent encounter: Secondary | ICD-10-CM | POA: Diagnosis not present

## 2021-06-20 DIAGNOSIS — I480 Paroxysmal atrial fibrillation: Secondary | ICD-10-CM | POA: Diagnosis not present

## 2021-06-20 DIAGNOSIS — N2581 Secondary hyperparathyroidism of renal origin: Secondary | ICD-10-CM | POA: Diagnosis not present

## 2021-06-20 DIAGNOSIS — D649 Anemia, unspecified: Secondary | ICD-10-CM | POA: Diagnosis not present

## 2021-06-20 DIAGNOSIS — N179 Acute kidney failure, unspecified: Secondary | ICD-10-CM | POA: Diagnosis not present

## 2021-06-20 DIAGNOSIS — G9341 Metabolic encephalopathy: Secondary | ICD-10-CM | POA: Diagnosis not present

## 2021-06-20 DIAGNOSIS — J9601 Acute respiratory failure with hypoxia: Secondary | ICD-10-CM | POA: Diagnosis not present

## 2021-06-20 DIAGNOSIS — R6520 Severe sepsis without septic shock: Secondary | ICD-10-CM | POA: Diagnosis not present

## 2021-06-21 ENCOUNTER — Encounter: Payer: Self-pay | Admitting: General Surgery

## 2021-06-21 DIAGNOSIS — G9341 Metabolic encephalopathy: Secondary | ICD-10-CM | POA: Diagnosis not present

## 2021-06-21 DIAGNOSIS — N179 Acute kidney failure, unspecified: Secondary | ICD-10-CM | POA: Diagnosis not present

## 2021-06-21 DIAGNOSIS — J9621 Acute and chronic respiratory failure with hypoxia: Secondary | ICD-10-CM | POA: Diagnosis not present

## 2021-06-21 DIAGNOSIS — D649 Anemia, unspecified: Secondary | ICD-10-CM | POA: Diagnosis not present

## 2021-06-21 DIAGNOSIS — N2581 Secondary hyperparathyroidism of renal origin: Secondary | ICD-10-CM | POA: Diagnosis not present

## 2021-06-21 DIAGNOSIS — M726 Necrotizing fasciitis: Secondary | ICD-10-CM | POA: Diagnosis not present

## 2021-06-21 DIAGNOSIS — S066X0D Traumatic subarachnoid hemorrhage without loss of consciousness, subsequent encounter: Secondary | ICD-10-CM | POA: Diagnosis not present

## 2021-06-21 DIAGNOSIS — R6520 Severe sepsis without septic shock: Secondary | ICD-10-CM | POA: Diagnosis not present

## 2021-06-21 DIAGNOSIS — I480 Paroxysmal atrial fibrillation: Secondary | ICD-10-CM | POA: Diagnosis not present

## 2021-06-21 DIAGNOSIS — I10 Essential (primary) hypertension: Secondary | ICD-10-CM | POA: Diagnosis not present

## 2021-06-21 DIAGNOSIS — I9589 Other hypotension: Secondary | ICD-10-CM | POA: Diagnosis not present

## 2021-06-21 DIAGNOSIS — E119 Type 2 diabetes mellitus without complications: Secondary | ICD-10-CM | POA: Diagnosis not present

## 2021-06-21 DIAGNOSIS — J9690 Respiratory failure, unspecified, unspecified whether with hypoxia or hypercapnia: Secondary | ICD-10-CM | POA: Diagnosis not present

## 2021-06-21 DIAGNOSIS — J9601 Acute respiratory failure with hypoxia: Secondary | ICD-10-CM | POA: Diagnosis not present

## 2021-06-21 DIAGNOSIS — F1120 Opioid dependence, uncomplicated: Secondary | ICD-10-CM | POA: Diagnosis not present

## 2021-06-21 DIAGNOSIS — S81802A Unspecified open wound, left lower leg, initial encounter: Secondary | ICD-10-CM | POA: Diagnosis not present

## 2021-06-22 DIAGNOSIS — G9341 Metabolic encephalopathy: Secondary | ICD-10-CM | POA: Diagnosis not present

## 2021-06-22 DIAGNOSIS — R6520 Severe sepsis without septic shock: Secondary | ICD-10-CM | POA: Diagnosis not present

## 2021-06-22 DIAGNOSIS — I480 Paroxysmal atrial fibrillation: Secondary | ICD-10-CM | POA: Diagnosis not present

## 2021-06-22 DIAGNOSIS — M726 Necrotizing fasciitis: Secondary | ICD-10-CM | POA: Diagnosis not present

## 2021-06-22 DIAGNOSIS — J9621 Acute and chronic respiratory failure with hypoxia: Secondary | ICD-10-CM | POA: Diagnosis not present

## 2021-06-23 DIAGNOSIS — I480 Paroxysmal atrial fibrillation: Secondary | ICD-10-CM | POA: Diagnosis not present

## 2021-06-23 DIAGNOSIS — R6520 Severe sepsis without septic shock: Secondary | ICD-10-CM | POA: Diagnosis not present

## 2021-06-23 DIAGNOSIS — M726 Necrotizing fasciitis: Secondary | ICD-10-CM | POA: Diagnosis not present

## 2021-06-23 DIAGNOSIS — J9621 Acute and chronic respiratory failure with hypoxia: Secondary | ICD-10-CM | POA: Diagnosis not present

## 2021-06-23 DIAGNOSIS — G9341 Metabolic encephalopathy: Secondary | ICD-10-CM | POA: Diagnosis not present

## 2021-06-24 DIAGNOSIS — M726 Necrotizing fasciitis: Secondary | ICD-10-CM | POA: Diagnosis not present

## 2021-06-24 DIAGNOSIS — I10 Essential (primary) hypertension: Secondary | ICD-10-CM | POA: Diagnosis not present

## 2021-06-24 DIAGNOSIS — J398 Other specified diseases of upper respiratory tract: Secondary | ICD-10-CM | POA: Diagnosis not present

## 2021-06-24 DIAGNOSIS — R6520 Severe sepsis without septic shock: Secondary | ICD-10-CM | POA: Diagnosis not present

## 2021-06-24 DIAGNOSIS — E119 Type 2 diabetes mellitus without complications: Secondary | ICD-10-CM | POA: Diagnosis not present

## 2021-06-24 DIAGNOSIS — N179 Acute kidney failure, unspecified: Secondary | ICD-10-CM | POA: Diagnosis not present

## 2021-06-24 DIAGNOSIS — S066X0D Traumatic subarachnoid hemorrhage without loss of consciousness, subsequent encounter: Secondary | ICD-10-CM | POA: Diagnosis not present

## 2021-06-24 DIAGNOSIS — R1312 Dysphagia, oropharyngeal phase: Secondary | ICD-10-CM | POA: Diagnosis not present

## 2021-06-24 DIAGNOSIS — J9601 Acute respiratory failure with hypoxia: Secondary | ICD-10-CM | POA: Diagnosis not present

## 2021-06-24 DIAGNOSIS — I9589 Other hypotension: Secondary | ICD-10-CM | POA: Diagnosis not present

## 2021-06-24 DIAGNOSIS — F1120 Opioid dependence, uncomplicated: Secondary | ICD-10-CM | POA: Diagnosis not present

## 2021-06-24 DIAGNOSIS — G9341 Metabolic encephalopathy: Secondary | ICD-10-CM | POA: Diagnosis not present

## 2021-06-24 DIAGNOSIS — J9690 Respiratory failure, unspecified, unspecified whether with hypoxia or hypercapnia: Secondary | ICD-10-CM | POA: Diagnosis not present

## 2021-06-24 DIAGNOSIS — S81802A Unspecified open wound, left lower leg, initial encounter: Secondary | ICD-10-CM | POA: Diagnosis not present

## 2021-06-24 DIAGNOSIS — I480 Paroxysmal atrial fibrillation: Secondary | ICD-10-CM | POA: Diagnosis not present

## 2021-06-24 DIAGNOSIS — D649 Anemia, unspecified: Secondary | ICD-10-CM | POA: Diagnosis not present

## 2021-06-24 DIAGNOSIS — J9621 Acute and chronic respiratory failure with hypoxia: Secondary | ICD-10-CM | POA: Diagnosis not present

## 2021-06-24 DIAGNOSIS — N2581 Secondary hyperparathyroidism of renal origin: Secondary | ICD-10-CM | POA: Diagnosis not present

## 2021-06-25 DIAGNOSIS — I480 Paroxysmal atrial fibrillation: Secondary | ICD-10-CM | POA: Diagnosis not present

## 2021-06-25 DIAGNOSIS — J9690 Respiratory failure, unspecified, unspecified whether with hypoxia or hypercapnia: Secondary | ICD-10-CM | POA: Diagnosis not present

## 2021-06-25 DIAGNOSIS — M726 Necrotizing fasciitis: Secondary | ICD-10-CM | POA: Diagnosis not present

## 2021-06-25 DIAGNOSIS — S066X0D Traumatic subarachnoid hemorrhage without loss of consciousness, subsequent encounter: Secondary | ICD-10-CM | POA: Diagnosis not present

## 2021-06-25 DIAGNOSIS — J9601 Acute respiratory failure with hypoxia: Secondary | ICD-10-CM | POA: Diagnosis not present

## 2021-06-25 DIAGNOSIS — I9589 Other hypotension: Secondary | ICD-10-CM | POA: Diagnosis not present

## 2021-06-25 DIAGNOSIS — N2581 Secondary hyperparathyroidism of renal origin: Secondary | ICD-10-CM | POA: Diagnosis not present

## 2021-06-25 DIAGNOSIS — R6520 Severe sepsis without septic shock: Secondary | ICD-10-CM | POA: Diagnosis not present

## 2021-06-25 DIAGNOSIS — J9621 Acute and chronic respiratory failure with hypoxia: Secondary | ICD-10-CM | POA: Diagnosis not present

## 2021-06-25 DIAGNOSIS — G9341 Metabolic encephalopathy: Secondary | ICD-10-CM | POA: Diagnosis not present

## 2021-06-25 DIAGNOSIS — D649 Anemia, unspecified: Secondary | ICD-10-CM | POA: Diagnosis not present

## 2021-06-25 DIAGNOSIS — N179 Acute kidney failure, unspecified: Secondary | ICD-10-CM | POA: Diagnosis not present

## 2021-06-26 DIAGNOSIS — D649 Anemia, unspecified: Secondary | ICD-10-CM | POA: Diagnosis not present

## 2021-06-26 DIAGNOSIS — I9589 Other hypotension: Secondary | ICD-10-CM | POA: Diagnosis not present

## 2021-06-26 DIAGNOSIS — S066X0D Traumatic subarachnoid hemorrhage without loss of consciousness, subsequent encounter: Secondary | ICD-10-CM | POA: Diagnosis not present

## 2021-06-26 DIAGNOSIS — I480 Paroxysmal atrial fibrillation: Secondary | ICD-10-CM | POA: Diagnosis not present

## 2021-06-26 DIAGNOSIS — G9341 Metabolic encephalopathy: Secondary | ICD-10-CM | POA: Diagnosis not present

## 2021-06-26 DIAGNOSIS — J9690 Respiratory failure, unspecified, unspecified whether with hypoxia or hypercapnia: Secondary | ICD-10-CM | POA: Diagnosis not present

## 2021-06-26 DIAGNOSIS — M726 Necrotizing fasciitis: Secondary | ICD-10-CM | POA: Diagnosis not present

## 2021-06-26 DIAGNOSIS — I10 Essential (primary) hypertension: Secondary | ICD-10-CM | POA: Diagnosis not present

## 2021-06-26 DIAGNOSIS — F1120 Opioid dependence, uncomplicated: Secondary | ICD-10-CM | POA: Diagnosis not present

## 2021-06-26 DIAGNOSIS — S81802A Unspecified open wound, left lower leg, initial encounter: Secondary | ICD-10-CM | POA: Diagnosis not present

## 2021-06-26 DIAGNOSIS — N2581 Secondary hyperparathyroidism of renal origin: Secondary | ICD-10-CM | POA: Diagnosis not present

## 2021-06-26 DIAGNOSIS — E119 Type 2 diabetes mellitus without complications: Secondary | ICD-10-CM | POA: Diagnosis not present

## 2021-06-26 DIAGNOSIS — R6520 Severe sepsis without septic shock: Secondary | ICD-10-CM | POA: Diagnosis not present

## 2021-06-26 DIAGNOSIS — J9601 Acute respiratory failure with hypoxia: Secondary | ICD-10-CM | POA: Diagnosis not present

## 2021-06-26 DIAGNOSIS — J9621 Acute and chronic respiratory failure with hypoxia: Secondary | ICD-10-CM | POA: Diagnosis not present

## 2021-06-26 DIAGNOSIS — N179 Acute kidney failure, unspecified: Secondary | ICD-10-CM | POA: Diagnosis not present

## 2021-06-27 DIAGNOSIS — N179 Acute kidney failure, unspecified: Secondary | ICD-10-CM | POA: Diagnosis not present

## 2021-06-27 DIAGNOSIS — S066X0D Traumatic subarachnoid hemorrhage without loss of consciousness, subsequent encounter: Secondary | ICD-10-CM | POA: Diagnosis not present

## 2021-06-27 DIAGNOSIS — I9589 Other hypotension: Secondary | ICD-10-CM | POA: Diagnosis not present

## 2021-06-27 DIAGNOSIS — R6520 Severe sepsis without septic shock: Secondary | ICD-10-CM | POA: Diagnosis not present

## 2021-06-27 DIAGNOSIS — G9341 Metabolic encephalopathy: Secondary | ICD-10-CM | POA: Diagnosis not present

## 2021-06-27 DIAGNOSIS — R5381 Other malaise: Secondary | ICD-10-CM | POA: Diagnosis not present

## 2021-06-27 DIAGNOSIS — J9601 Acute respiratory failure with hypoxia: Secondary | ICD-10-CM | POA: Diagnosis not present

## 2021-06-27 DIAGNOSIS — N2581 Secondary hyperparathyroidism of renal origin: Secondary | ICD-10-CM | POA: Diagnosis not present

## 2021-06-27 DIAGNOSIS — M726 Necrotizing fasciitis: Secondary | ICD-10-CM | POA: Diagnosis not present

## 2021-06-27 DIAGNOSIS — M6281 Muscle weakness (generalized): Secondary | ICD-10-CM | POA: Diagnosis not present

## 2021-06-27 DIAGNOSIS — D649 Anemia, unspecified: Secondary | ICD-10-CM | POA: Diagnosis not present

## 2021-06-27 DIAGNOSIS — I480 Paroxysmal atrial fibrillation: Secondary | ICD-10-CM | POA: Diagnosis not present

## 2021-06-27 DIAGNOSIS — J9621 Acute and chronic respiratory failure with hypoxia: Secondary | ICD-10-CM | POA: Diagnosis not present

## 2021-06-27 DIAGNOSIS — R2689 Other abnormalities of gait and mobility: Secondary | ICD-10-CM | POA: Diagnosis not present

## 2021-06-27 DIAGNOSIS — R4189 Other symptoms and signs involving cognitive functions and awareness: Secondary | ICD-10-CM | POA: Diagnosis not present

## 2021-06-28 DIAGNOSIS — K3184 Gastroparesis: Secondary | ICD-10-CM | POA: Diagnosis not present

## 2021-06-28 DIAGNOSIS — K2100 Gastro-esophageal reflux disease with esophagitis, without bleeding: Secondary | ICD-10-CM | POA: Diagnosis not present

## 2021-06-28 DIAGNOSIS — M726 Necrotizing fasciitis: Secondary | ICD-10-CM | POA: Diagnosis not present

## 2021-06-28 DIAGNOSIS — J9690 Respiratory failure, unspecified, unspecified whether with hypoxia or hypercapnia: Secondary | ICD-10-CM | POA: Diagnosis not present

## 2021-06-28 DIAGNOSIS — N179 Acute kidney failure, unspecified: Secondary | ICD-10-CM | POA: Diagnosis not present

## 2021-06-28 DIAGNOSIS — I10 Essential (primary) hypertension: Secondary | ICD-10-CM | POA: Diagnosis not present

## 2021-06-28 DIAGNOSIS — T8189XA Other complications of procedures, not elsewhere classified, initial encounter: Secondary | ICD-10-CM | POA: Diagnosis not present

## 2021-06-28 DIAGNOSIS — J9621 Acute and chronic respiratory failure with hypoxia: Secondary | ICD-10-CM | POA: Diagnosis not present

## 2021-06-28 DIAGNOSIS — I480 Paroxysmal atrial fibrillation: Secondary | ICD-10-CM | POA: Diagnosis not present

## 2021-06-28 DIAGNOSIS — L03116 Cellulitis of left lower limb: Secondary | ICD-10-CM | POA: Diagnosis not present

## 2021-06-28 DIAGNOSIS — N2581 Secondary hyperparathyroidism of renal origin: Secondary | ICD-10-CM | POA: Diagnosis not present

## 2021-06-28 DIAGNOSIS — S81802A Unspecified open wound, left lower leg, initial encounter: Secondary | ICD-10-CM | POA: Diagnosis not present

## 2021-06-28 DIAGNOSIS — I9589 Other hypotension: Secondary | ICD-10-CM | POA: Diagnosis not present

## 2021-06-28 DIAGNOSIS — F1120 Opioid dependence, uncomplicated: Secondary | ICD-10-CM | POA: Diagnosis not present

## 2021-06-28 DIAGNOSIS — S066X0D Traumatic subarachnoid hemorrhage without loss of consciousness, subsequent encounter: Secondary | ICD-10-CM | POA: Diagnosis not present

## 2021-06-28 DIAGNOSIS — E119 Type 2 diabetes mellitus without complications: Secondary | ICD-10-CM | POA: Diagnosis not present

## 2021-06-28 DIAGNOSIS — G9341 Metabolic encephalopathy: Secondary | ICD-10-CM | POA: Diagnosis not present

## 2021-06-28 DIAGNOSIS — R6520 Severe sepsis without septic shock: Secondary | ICD-10-CM | POA: Diagnosis not present

## 2021-06-28 DIAGNOSIS — R1311 Dysphagia, oral phase: Secondary | ICD-10-CM | POA: Diagnosis not present

## 2021-06-28 DIAGNOSIS — J9601 Acute respiratory failure with hypoxia: Secondary | ICD-10-CM | POA: Diagnosis not present

## 2021-06-28 DIAGNOSIS — D649 Anemia, unspecified: Secondary | ICD-10-CM | POA: Diagnosis not present

## 2021-06-28 DIAGNOSIS — R918 Other nonspecific abnormal finding of lung field: Secondary | ICD-10-CM | POA: Diagnosis not present

## 2021-06-29 DIAGNOSIS — I4891 Unspecified atrial fibrillation: Secondary | ICD-10-CM | POA: Diagnosis not present

## 2021-06-29 DIAGNOSIS — Z9911 Dependence on respirator [ventilator] status: Secondary | ICD-10-CM | POA: Diagnosis not present

## 2021-06-29 DIAGNOSIS — K3184 Gastroparesis: Secondary | ICD-10-CM | POA: Diagnosis not present

## 2021-06-29 DIAGNOSIS — R1311 Dysphagia, oral phase: Secondary | ICD-10-CM | POA: Diagnosis not present

## 2021-06-29 DIAGNOSIS — J9621 Acute and chronic respiratory failure with hypoxia: Secondary | ICD-10-CM | POA: Diagnosis not present

## 2021-06-29 DIAGNOSIS — K2100 Gastro-esophageal reflux disease with esophagitis, without bleeding: Secondary | ICD-10-CM | POA: Diagnosis not present

## 2021-06-29 DIAGNOSIS — M726 Necrotizing fasciitis: Secondary | ICD-10-CM | POA: Diagnosis not present

## 2021-06-29 DIAGNOSIS — L03116 Cellulitis of left lower limb: Secondary | ICD-10-CM | POA: Diagnosis not present

## 2021-06-29 DIAGNOSIS — J189 Pneumonia, unspecified organism: Secondary | ICD-10-CM | POA: Diagnosis not present

## 2021-06-29 DIAGNOSIS — T8189XA Other complications of procedures, not elsewhere classified, initial encounter: Secondary | ICD-10-CM | POA: Diagnosis not present

## 2021-06-30 DIAGNOSIS — M726 Necrotizing fasciitis: Secondary | ICD-10-CM | POA: Diagnosis not present

## 2021-06-30 DIAGNOSIS — K2100 Gastro-esophageal reflux disease with esophagitis, without bleeding: Secondary | ICD-10-CM | POA: Diagnosis not present

## 2021-06-30 DIAGNOSIS — T8189XA Other complications of procedures, not elsewhere classified, initial encounter: Secondary | ICD-10-CM | POA: Diagnosis not present

## 2021-06-30 DIAGNOSIS — Z9911 Dependence on respirator [ventilator] status: Secondary | ICD-10-CM | POA: Diagnosis not present

## 2021-06-30 DIAGNOSIS — K3184 Gastroparesis: Secondary | ICD-10-CM | POA: Diagnosis not present

## 2021-06-30 DIAGNOSIS — J189 Pneumonia, unspecified organism: Secondary | ICD-10-CM | POA: Diagnosis not present

## 2021-06-30 DIAGNOSIS — R1311 Dysphagia, oral phase: Secondary | ICD-10-CM | POA: Diagnosis not present

## 2021-06-30 DIAGNOSIS — L03116 Cellulitis of left lower limb: Secondary | ICD-10-CM | POA: Diagnosis not present

## 2021-06-30 DIAGNOSIS — J9621 Acute and chronic respiratory failure with hypoxia: Secondary | ICD-10-CM | POA: Diagnosis not present

## 2021-06-30 DIAGNOSIS — I4891 Unspecified atrial fibrillation: Secondary | ICD-10-CM | POA: Diagnosis not present

## 2021-07-01 DIAGNOSIS — N179 Acute kidney failure, unspecified: Secondary | ICD-10-CM | POA: Diagnosis not present

## 2021-07-01 DIAGNOSIS — J9621 Acute and chronic respiratory failure with hypoxia: Secondary | ICD-10-CM | POA: Diagnosis not present

## 2021-07-01 DIAGNOSIS — E119 Type 2 diabetes mellitus without complications: Secondary | ICD-10-CM | POA: Diagnosis not present

## 2021-07-01 DIAGNOSIS — I480 Paroxysmal atrial fibrillation: Secondary | ICD-10-CM | POA: Diagnosis not present

## 2021-07-01 DIAGNOSIS — R1312 Dysphagia, oropharyngeal phase: Secondary | ICD-10-CM | POA: Diagnosis not present

## 2021-07-01 DIAGNOSIS — M726 Necrotizing fasciitis: Secondary | ICD-10-CM | POA: Diagnosis not present

## 2021-07-01 DIAGNOSIS — J9690 Respiratory failure, unspecified, unspecified whether with hypoxia or hypercapnia: Secondary | ICD-10-CM | POA: Diagnosis not present

## 2021-07-01 DIAGNOSIS — S066X0A Traumatic subarachnoid hemorrhage without loss of consciousness, initial encounter: Secondary | ICD-10-CM | POA: Diagnosis not present

## 2021-07-01 DIAGNOSIS — S81802A Unspecified open wound, left lower leg, initial encounter: Secondary | ICD-10-CM | POA: Diagnosis not present

## 2021-07-01 DIAGNOSIS — I9589 Other hypotension: Secondary | ICD-10-CM | POA: Diagnosis not present

## 2021-07-01 DIAGNOSIS — F1120 Opioid dependence, uncomplicated: Secondary | ICD-10-CM | POA: Diagnosis not present

## 2021-07-01 DIAGNOSIS — J9601 Acute respiratory failure with hypoxia: Secondary | ICD-10-CM | POA: Diagnosis not present

## 2021-07-01 DIAGNOSIS — D649 Anemia, unspecified: Secondary | ICD-10-CM | POA: Diagnosis not present

## 2021-07-01 DIAGNOSIS — Z9911 Dependence on respirator [ventilator] status: Secondary | ICD-10-CM | POA: Diagnosis not present

## 2021-07-01 DIAGNOSIS — N2581 Secondary hyperparathyroidism of renal origin: Secondary | ICD-10-CM | POA: Diagnosis not present

## 2021-07-01 DIAGNOSIS — J398 Other specified diseases of upper respiratory tract: Secondary | ICD-10-CM | POA: Diagnosis not present

## 2021-07-01 DIAGNOSIS — I10 Essential (primary) hypertension: Secondary | ICD-10-CM | POA: Diagnosis not present

## 2021-07-02 DIAGNOSIS — M726 Necrotizing fasciitis: Secondary | ICD-10-CM | POA: Diagnosis not present

## 2021-07-02 DIAGNOSIS — M6281 Muscle weakness (generalized): Secondary | ICD-10-CM | POA: Diagnosis not present

## 2021-07-02 DIAGNOSIS — S81802A Unspecified open wound, left lower leg, initial encounter: Secondary | ICD-10-CM | POA: Diagnosis not present

## 2021-07-02 DIAGNOSIS — N179 Acute kidney failure, unspecified: Secondary | ICD-10-CM | POA: Diagnosis not present

## 2021-07-02 DIAGNOSIS — R4189 Other symptoms and signs involving cognitive functions and awareness: Secondary | ICD-10-CM | POA: Diagnosis not present

## 2021-07-02 DIAGNOSIS — J398 Other specified diseases of upper respiratory tract: Secondary | ICD-10-CM | POA: Diagnosis not present

## 2021-07-02 DIAGNOSIS — Z9911 Dependence on respirator [ventilator] status: Secondary | ICD-10-CM | POA: Diagnosis not present

## 2021-07-02 DIAGNOSIS — R5381 Other malaise: Secondary | ICD-10-CM | POA: Diagnosis not present

## 2021-07-02 DIAGNOSIS — S066X0A Traumatic subarachnoid hemorrhage without loss of consciousness, initial encounter: Secondary | ICD-10-CM | POA: Diagnosis not present

## 2021-07-02 DIAGNOSIS — I9589 Other hypotension: Secondary | ICD-10-CM | POA: Diagnosis not present

## 2021-07-02 DIAGNOSIS — J9621 Acute and chronic respiratory failure with hypoxia: Secondary | ICD-10-CM | POA: Diagnosis not present

## 2021-07-02 DIAGNOSIS — N2581 Secondary hyperparathyroidism of renal origin: Secondary | ICD-10-CM | POA: Diagnosis not present

## 2021-07-02 DIAGNOSIS — I480 Paroxysmal atrial fibrillation: Secondary | ICD-10-CM | POA: Diagnosis not present

## 2021-07-02 DIAGNOSIS — J9601 Acute respiratory failure with hypoxia: Secondary | ICD-10-CM | POA: Diagnosis not present

## 2021-07-02 DIAGNOSIS — D649 Anemia, unspecified: Secondary | ICD-10-CM | POA: Diagnosis not present

## 2021-07-02 DIAGNOSIS — R2689 Other abnormalities of gait and mobility: Secondary | ICD-10-CM | POA: Diagnosis not present

## 2021-07-03 DIAGNOSIS — S81802A Unspecified open wound, left lower leg, initial encounter: Secondary | ICD-10-CM | POA: Diagnosis not present

## 2021-07-03 DIAGNOSIS — N2581 Secondary hyperparathyroidism of renal origin: Secondary | ICD-10-CM | POA: Diagnosis not present

## 2021-07-03 DIAGNOSIS — I480 Paroxysmal atrial fibrillation: Secondary | ICD-10-CM | POA: Diagnosis not present

## 2021-07-03 DIAGNOSIS — J9690 Respiratory failure, unspecified, unspecified whether with hypoxia or hypercapnia: Secondary | ICD-10-CM | POA: Diagnosis not present

## 2021-07-03 DIAGNOSIS — N179 Acute kidney failure, unspecified: Secondary | ICD-10-CM | POA: Diagnosis not present

## 2021-07-03 DIAGNOSIS — S066X0A Traumatic subarachnoid hemorrhage without loss of consciousness, initial encounter: Secondary | ICD-10-CM | POA: Diagnosis not present

## 2021-07-03 DIAGNOSIS — J9601 Acute respiratory failure with hypoxia: Secondary | ICD-10-CM | POA: Diagnosis not present

## 2021-07-03 DIAGNOSIS — M726 Necrotizing fasciitis: Secondary | ICD-10-CM | POA: Diagnosis not present

## 2021-07-03 DIAGNOSIS — I9589 Other hypotension: Secondary | ICD-10-CM | POA: Diagnosis not present

## 2021-07-03 DIAGNOSIS — D649 Anemia, unspecified: Secondary | ICD-10-CM | POA: Diagnosis not present

## 2021-07-03 DIAGNOSIS — E119 Type 2 diabetes mellitus without complications: Secondary | ICD-10-CM | POA: Diagnosis not present

## 2021-07-03 DIAGNOSIS — F1120 Opioid dependence, uncomplicated: Secondary | ICD-10-CM | POA: Diagnosis not present

## 2021-07-03 DIAGNOSIS — I10 Essential (primary) hypertension: Secondary | ICD-10-CM | POA: Diagnosis not present

## 2021-07-03 DIAGNOSIS — Z9911 Dependence on respirator [ventilator] status: Secondary | ICD-10-CM | POA: Diagnosis not present

## 2021-07-03 DIAGNOSIS — J9621 Acute and chronic respiratory failure with hypoxia: Secondary | ICD-10-CM | POA: Diagnosis not present

## 2021-07-04 DIAGNOSIS — S066X0A Traumatic subarachnoid hemorrhage without loss of consciousness, initial encounter: Secondary | ICD-10-CM | POA: Diagnosis not present

## 2021-07-04 DIAGNOSIS — G9341 Metabolic encephalopathy: Secondary | ICD-10-CM

## 2021-07-04 DIAGNOSIS — N2581 Secondary hyperparathyroidism of renal origin: Secondary | ICD-10-CM | POA: Diagnosis not present

## 2021-07-04 DIAGNOSIS — R6520 Severe sepsis without septic shock: Secondary | ICD-10-CM

## 2021-07-04 DIAGNOSIS — I480 Paroxysmal atrial fibrillation: Secondary | ICD-10-CM

## 2021-07-04 DIAGNOSIS — N179 Acute kidney failure, unspecified: Secondary | ICD-10-CM | POA: Diagnosis not present

## 2021-07-04 DIAGNOSIS — M726 Necrotizing fasciitis: Secondary | ICD-10-CM | POA: Diagnosis not present

## 2021-07-04 DIAGNOSIS — D649 Anemia, unspecified: Secondary | ICD-10-CM | POA: Diagnosis not present

## 2021-07-04 DIAGNOSIS — I9589 Other hypotension: Secondary | ICD-10-CM | POA: Diagnosis not present

## 2021-07-04 DIAGNOSIS — J9621 Acute and chronic respiratory failure with hypoxia: Secondary | ICD-10-CM

## 2021-07-04 DIAGNOSIS — J9601 Acute respiratory failure with hypoxia: Secondary | ICD-10-CM | POA: Diagnosis not present

## 2021-07-05 DIAGNOSIS — I10 Essential (primary) hypertension: Secondary | ICD-10-CM | POA: Diagnosis not present

## 2021-07-05 DIAGNOSIS — J9690 Respiratory failure, unspecified, unspecified whether with hypoxia or hypercapnia: Secondary | ICD-10-CM | POA: Diagnosis not present

## 2021-07-05 DIAGNOSIS — G9341 Metabolic encephalopathy: Secondary | ICD-10-CM | POA: Diagnosis not present

## 2021-07-05 DIAGNOSIS — N2581 Secondary hyperparathyroidism of renal origin: Secondary | ICD-10-CM | POA: Diagnosis not present

## 2021-07-05 DIAGNOSIS — S066X0A Traumatic subarachnoid hemorrhage without loss of consciousness, initial encounter: Secondary | ICD-10-CM | POA: Diagnosis not present

## 2021-07-05 DIAGNOSIS — N179 Acute kidney failure, unspecified: Secondary | ICD-10-CM | POA: Diagnosis not present

## 2021-07-05 DIAGNOSIS — I9589 Other hypotension: Secondary | ICD-10-CM | POA: Diagnosis not present

## 2021-07-05 DIAGNOSIS — F1120 Opioid dependence, uncomplicated: Secondary | ICD-10-CM | POA: Diagnosis not present

## 2021-07-05 DIAGNOSIS — R6520 Severe sepsis without septic shock: Secondary | ICD-10-CM | POA: Diagnosis not present

## 2021-07-05 DIAGNOSIS — E119 Type 2 diabetes mellitus without complications: Secondary | ICD-10-CM | POA: Diagnosis not present

## 2021-07-05 DIAGNOSIS — I480 Paroxysmal atrial fibrillation: Secondary | ICD-10-CM | POA: Diagnosis not present

## 2021-07-05 DIAGNOSIS — J9621 Acute and chronic respiratory failure with hypoxia: Secondary | ICD-10-CM | POA: Diagnosis not present

## 2021-07-05 DIAGNOSIS — S81802A Unspecified open wound, left lower leg, initial encounter: Secondary | ICD-10-CM | POA: Diagnosis not present

## 2021-07-05 DIAGNOSIS — M726 Necrotizing fasciitis: Secondary | ICD-10-CM | POA: Diagnosis not present

## 2021-07-05 DIAGNOSIS — D649 Anemia, unspecified: Secondary | ICD-10-CM | POA: Diagnosis not present

## 2021-07-05 DIAGNOSIS — J9601 Acute respiratory failure with hypoxia: Secondary | ICD-10-CM | POA: Diagnosis not present

## 2021-07-05 NOTE — Progress Notes (Deleted)
Name: Alexandra Foster   MRN: [id number]    DOB: 09-29-1950   Date:07/05/2021 ? ?     Progress Note ? ?Subjective ? ?Chief Complaint ? ?Follow Up ? ?HPI ? ?OA both knees:no instability, pain is aching like and only when walking it can go up to 9/10, zero when resting,  she states both knees are painful, no effusion or erythema. She denies falls. She was seen by Ortho and had hyaluronic acid injections without much help  ? ?DM: glucose at home has been 120's  fasting, last A1C at goal but due for repeat level , denies polyphagia, polydipsia or polyuria. She has dyslipidemia, obesity. She is taking statins without problems, she is going to have eye exam tomorrow with Dr. Gloriann Loan  ? ?OSA: machine broke last week, but they replaced it yesterday and she got to wear last night.  ? ?HTN: she states she still has medication at home, denies chest pain, palpitation and has stable SOB with activity  ? ?AR: she states nasal congestion has increased in the past week when her CPAP machine broke. Initially nasal congestion but this morning also some clear rhinorrhea, feels stuffy, no fever or chills or change in appetite  Advised to resume Loratadine in am, make sure she is taking singular and resume using nasal sprays twice daily  ? ?Leg cramps: she states calves cramps at night, but does not last, discussed magnesium oxide, advised to stretch before going to bed and make sure she is staying hydrated  ? ?GERD: denies heart burn or indigestion as long as she takes PPI , unchanged  ? ?Chronic bronchitis: she coughs in the mornings, dry cough, smoked for 30 years but quit in 2008, she denies wheezing, but has some SOB with activity . Taking Trelegy prn only, explained only valid for 60 days once opened , she states she is using about every other day  ? ?Morbid obesity: she has only been walking inside her house due to knee pain, has big family dinners multiple times a week. She states she has a large family and eats frequently . She  cooks [place] style food daily for her husband and she likes to eat  ? ?Patient Active Problem List  ? Diagnosis Date Noted  ? Abdominal distension   ? Left leg cellulitis   ? Status post tracheostomy ([place]) 05/18/2021  ? Subdural hemorrhage ([place]) 05/18/2021  ? Necrotizing fasciitis ([place]) 05/12/2021  ? Acute and chronic respiratory failure (acute-on-chronic) ([place]) 05/12/2021  ? On mechanically assisted ventilation ([place]) 05/12/2021  ? Shock liver 05/12/2021  ? Anemia associated with acute blood loss 05/12/2021  ? Metabolic acidosis 37/16/9678  ? Encounter for continuous renal replacement therapy (CRRT) for acute renal failure ([place]) 05/12/2021  ? Acute encephalopathy 05/12/2021  ? Group A streptococcal infection 05/12/2021  ? Streptococcal toxic shock syndrome ([place]) 05/12/2021  ? Atrial fibrillation and flutter ([place])   ? Severe sepsis with septic shock (CODE) ([place]) 05/04/2021  ? Personal history of COVID-19 07/14/2019  ? Bilateral lower extremity edema 05/03/2018  ? Lower extremity pain, bilateral 05/03/2018  ? Diabetes mellitus type 2 in obese ([place]) 04/23/2018  ? Osteoarthritis of both knees 01/14/2018  ? Urge incontinence 03/14/2016  ? Osteopenia 02/13/2016  ? OSA (obstructive sleep apnea) 01/08/2016  ? Allergic rhinitis 04/13/2015  ? COPD (chronic obstructive pulmonary disease) ([place]) 09/18/2014  ? Morbid obesity ([place]) 09/18/2014  ? Esophagitis, reflux 09/18/2014  ? Essential hypertension 09/21/2006  ? ? ?Past Surgical History:  ?Procedure Laterality  Date  ? ABDOMINAL HYSTERECTOMY    ? APPLICATION OF WOUND VAC Left 05/17/2021  ? Procedure: APPLICATION OF WOUND VAC/WOUND VAC EXCHANGE-Matrix Myriad;  Surgeon: Herbert Pun, MD;  Location: [facility] ORS;  Service: General;  Laterality: Left;  ? APPLICATION OF WOUND VAC  05/24/2021  ? Procedure: APPLICATION OF WOUND VAC;  Surgeon: Herbert Pun, MD;  Location: [facility] ORS;  Service: General;;  ? APPLICATION OF WOUND VAC  05/10/2021  ? Procedure: APPLICATION OF WOUND  VAC;  Surgeon: Herbert Pun, MD;  Location: [facility] ORS;  Service: General;;  ? COLONOSCOPY  10/29/2006  ? Dr Allen Norris  ? COLONOSCOPY WITH PROPOFOL N/A 11/05/2016  ? Procedure: COLONOSCOPY WITH PROPOFOL;  Surgeon: Robert Bellow, MD;  Location: [facility] ENDOSCOPY;  Service: Endoscopy;  Laterality: N/A;  ? INCISION AND DRAINAGE OF WOUND Left 05/24/2021  ? Procedure: IRRIGATION AND DEBRIDEMENT LEFT LEG;  Surgeon: Herbert Pun, MD;  Location: [facility] ORS;  Service: General;  Laterality: Left;  ? INCISION AND DRAINAGE OF WOUND Left 05/10/2021  ? Procedure: IRRIGATION AND DEBRIDEMENT LEFT LEG;  Surgeon: Herbert Pun, MD;  Location: [facility] OR[facility];  Service: General;  Laterality: Left;  ? IR FLUORO GUIDE CV LINE RIGHT  06/12/2021  ? IR PERC TUN PERIT CATH WO PORT S&I /IMAG  06/12/2021  ? IR REPLC GASTRO/COLONIC TUBE PERCUT W/FLUORO  06/12/2021  ? IVC FILTER INSERTION N/A 05/29/2021  ? Procedure: IVC FILTER INSERTION;  Surgeon: Katha Cabal, MD;  Location: [place] CV LAB;  Service: Cardiovascular;  Laterality: N/A;  ? MINOR GRAFT APPLICATION  1/61/0960  ? Procedure: Myriad Matrix  APPLICATION;  Surgeon: Herbert Pun, MD;  Location: [facility] ORS;  Service: General;;  ? NASAL SINUS SURGERY  2002  ? Dr Carlis Abbott  ? PEG PLACEMENT N/A 05/28/2021  ? Procedure: PERCUTANEOUS ENDOSCOPIC GASTROSTOMY (PEG) PLACEMENT;  Surgeon: Benjamine Sprague, DO;  Location: [facility] ENDOSCOPY;  Service: General;  Laterality: N/A;  TRAVEL CASE  ? TRACHEOSTOMY TUBE PLACEMENT N/A 05/17/2021  ? Procedure: TRACHEOSTOMY;  Surgeon: Clyde Canterbury, MD;  Location: [facility] ORS;  Service: ENT;  Laterality: N/A;  ? WOUND DEBRIDEMENT Left 05/07/2021  ? Procedure: DEBRIDEMENT WOUND;  Surgeon: Herbert Pun, MD;  Location: [facility] ORS;  Service: General;  Laterality: Left;  ? ? ?Family History  ?Problem Relation Age of Onset  ? Congestive Heart Failure Mother   ? Coronary artery disease Father 13  ? Breast cancer Sister 60  ? Heart disease Brother   ? Varicose  Veins Brother   ? Alcohol abuse Brother   ? ? ?Social History  ? ?Tobacco Use  ? Smoking status: Former  ?  Packs/day: 2.00  ?  Years: 40.00  ?  Pack years: 80.00  ?  Types: Cigarettes  ?  Quit date: 2003  ?  Years since quitting: 20.2  ? Smokeless tobacco: Former  ?  Types: Snuff  ?  Quit date: 04/2001  ? Tobacco comments:  ?  smoking cessation materials not required  ?Substance Use Topics  ? Alcohol use: No  ?  Alcohol/week: 0.0 standard drinks  ? ? ? ?Current Outpatient Medications:  ?  amiodarone (PACERONE) 200 MG tablet, Place 1 tablet (200 mg total) into feeding tube 2 (two) times daily., Disp: , Rfl:  ?  artificial tears (LACRILUBE) OINT ophthalmic ointment, Place into both eyes every 4 (four) hours as needed for dry eyes., Disp: , Rfl:  ?  ascorbic acid (VITAMIN C) 500 MG tablet, Place 1 tablet (500 mg total) into feeding tube 2 (  two) times daily., Disp: , Rfl:  ?  chlorhexidine gluconate, MEDLINE KIT, (PERIDEX) 0.12 % solution, 15 mLs by Mouth Rinse route 2 (two) times daily., Disp: 120 mL, Rfl: 0 ?  cholecalciferol (VITAMIN D) 25 MCG tablet, Place 2 tablets (2,000 Units total) into feeding tube daily., Disp: , Rfl:  ?  collagenase (SANTYL) ointment, Apply topically daily., Disp: 15 g, Rfl: 0 ?  dextrose 5 % SOLN 100 mL with copper chloride 0.4 MG/ML SOLN 2 mg, Inject 2 mg into the vein daily., Disp: , Rfl:  ?  escitalopram (LEXAPRO) 10 MG tablet, Place 1 tablet (10 mg total) into feeding tube daily., Disp: , Rfl:  ?  ipratropium-albuterol (DUONEB) 0.5-2.5 (3) MG/3ML SOLN, Take 3 mLs by nebulization 3 (three) times daily., Disp: 360 mL, Rfl:  ?  lactobacillus (FLORANEX/LACTINEX) PACK, Place 1 packet (1 g total) into feeding tube 3 (three) times daily with meals., Disp: , Rfl:  ?  lip balm (BLISTEX) OINT, Apply 1 application topically 3 (three) times daily., Disp: , Rfl:  ?  loperamide (IMODIUM) 2 MG capsule, Take 1 capsule (2 mg total) by mouth as needed for diarrhea or loose stools., Disp: 30 capsule, Rfl:  0 ?  midodrine (PROAMATINE) 5 MG tablet, Place 3 tablets (15 mg total) into feeding tube 3 (three) times daily with meals., Disp: , Rfl:  ?  multivitamin (RENA-VIT) TABS tablet, Place 1 tablet into f

## 2021-07-06 DIAGNOSIS — I480 Paroxysmal atrial fibrillation: Secondary | ICD-10-CM | POA: Diagnosis not present

## 2021-07-06 DIAGNOSIS — J9621 Acute and chronic respiratory failure with hypoxia: Secondary | ICD-10-CM | POA: Diagnosis not present

## 2021-07-06 DIAGNOSIS — M726 Necrotizing fasciitis: Secondary | ICD-10-CM | POA: Diagnosis not present

## 2021-07-06 DIAGNOSIS — G9341 Metabolic encephalopathy: Secondary | ICD-10-CM | POA: Diagnosis not present

## 2021-07-06 DIAGNOSIS — J9601 Acute respiratory failure with hypoxia: Secondary | ICD-10-CM | POA: Diagnosis not present

## 2021-07-06 DIAGNOSIS — S066X0D Traumatic subarachnoid hemorrhage without loss of consciousness, subsequent encounter: Secondary | ICD-10-CM | POA: Diagnosis not present

## 2021-07-06 DIAGNOSIS — R6520 Severe sepsis without septic shock: Secondary | ICD-10-CM | POA: Diagnosis not present

## 2021-07-06 DIAGNOSIS — N179 Acute kidney failure, unspecified: Secondary | ICD-10-CM | POA: Diagnosis not present

## 2021-07-07 DIAGNOSIS — J9621 Acute and chronic respiratory failure with hypoxia: Secondary | ICD-10-CM | POA: Diagnosis not present

## 2021-07-07 DIAGNOSIS — I480 Paroxysmal atrial fibrillation: Secondary | ICD-10-CM | POA: Diagnosis not present

## 2021-07-07 DIAGNOSIS — M726 Necrotizing fasciitis: Secondary | ICD-10-CM | POA: Diagnosis not present

## 2021-07-07 DIAGNOSIS — G9341 Metabolic encephalopathy: Secondary | ICD-10-CM | POA: Diagnosis not present

## 2021-07-07 DIAGNOSIS — J9601 Acute respiratory failure with hypoxia: Secondary | ICD-10-CM | POA: Diagnosis not present

## 2021-07-07 DIAGNOSIS — N179 Acute kidney failure, unspecified: Secondary | ICD-10-CM | POA: Diagnosis not present

## 2021-07-07 DIAGNOSIS — S066X0A Traumatic subarachnoid hemorrhage without loss of consciousness, initial encounter: Secondary | ICD-10-CM | POA: Diagnosis not present

## 2021-07-07 DIAGNOSIS — R6520 Severe sepsis without septic shock: Secondary | ICD-10-CM | POA: Diagnosis not present

## 2021-07-08 ENCOUNTER — Ambulatory Visit: Payer: Medicare HMO | Admitting: Family Medicine

## 2021-07-08 DIAGNOSIS — N2581 Secondary hyperparathyroidism of renal origin: Secondary | ICD-10-CM | POA: Diagnosis not present

## 2021-07-08 DIAGNOSIS — J9601 Acute respiratory failure with hypoxia: Secondary | ICD-10-CM | POA: Diagnosis not present

## 2021-07-08 DIAGNOSIS — I480 Paroxysmal atrial fibrillation: Secondary | ICD-10-CM | POA: Diagnosis not present

## 2021-07-08 DIAGNOSIS — Z23 Encounter for immunization: Secondary | ICD-10-CM

## 2021-07-08 DIAGNOSIS — S066X0A Traumatic subarachnoid hemorrhage without loss of consciousness, initial encounter: Secondary | ICD-10-CM | POA: Diagnosis not present

## 2021-07-08 DIAGNOSIS — F1120 Opioid dependence, uncomplicated: Secondary | ICD-10-CM | POA: Diagnosis not present

## 2021-07-08 DIAGNOSIS — G9341 Metabolic encephalopathy: Secondary | ICD-10-CM | POA: Diagnosis not present

## 2021-07-08 DIAGNOSIS — S81802A Unspecified open wound, left lower leg, initial encounter: Secondary | ICD-10-CM | POA: Diagnosis not present

## 2021-07-08 DIAGNOSIS — D649 Anemia, unspecified: Secondary | ICD-10-CM | POA: Diagnosis not present

## 2021-07-08 DIAGNOSIS — M726 Necrotizing fasciitis: Secondary | ICD-10-CM | POA: Diagnosis not present

## 2021-07-08 DIAGNOSIS — I10 Essential (primary) hypertension: Secondary | ICD-10-CM | POA: Diagnosis not present

## 2021-07-08 DIAGNOSIS — N179 Acute kidney failure, unspecified: Secondary | ICD-10-CM | POA: Diagnosis not present

## 2021-07-08 DIAGNOSIS — J9690 Respiratory failure, unspecified, unspecified whether with hypoxia or hypercapnia: Secondary | ICD-10-CM | POA: Diagnosis not present

## 2021-07-08 DIAGNOSIS — I9589 Other hypotension: Secondary | ICD-10-CM | POA: Diagnosis not present

## 2021-07-08 DIAGNOSIS — E119 Type 2 diabetes mellitus without complications: Secondary | ICD-10-CM | POA: Diagnosis not present

## 2021-07-08 DIAGNOSIS — J9621 Acute and chronic respiratory failure with hypoxia: Secondary | ICD-10-CM | POA: Diagnosis not present

## 2021-07-08 DIAGNOSIS — R6520 Severe sepsis without septic shock: Secondary | ICD-10-CM | POA: Diagnosis not present

## 2021-07-09 DIAGNOSIS — I480 Paroxysmal atrial fibrillation: Secondary | ICD-10-CM | POA: Diagnosis not present

## 2021-07-09 DIAGNOSIS — J9621 Acute and chronic respiratory failure with hypoxia: Secondary | ICD-10-CM | POA: Diagnosis not present

## 2021-07-09 DIAGNOSIS — N2581 Secondary hyperparathyroidism of renal origin: Secondary | ICD-10-CM | POA: Diagnosis not present

## 2021-07-09 DIAGNOSIS — G9341 Metabolic encephalopathy: Secondary | ICD-10-CM | POA: Diagnosis not present

## 2021-07-09 DIAGNOSIS — S066X0A Traumatic subarachnoid hemorrhage without loss of consciousness, initial encounter: Secondary | ICD-10-CM | POA: Diagnosis not present

## 2021-07-09 DIAGNOSIS — R6520 Severe sepsis without septic shock: Secondary | ICD-10-CM | POA: Diagnosis not present

## 2021-07-09 DIAGNOSIS — D649 Anemia, unspecified: Secondary | ICD-10-CM | POA: Diagnosis not present

## 2021-07-09 DIAGNOSIS — I9589 Other hypotension: Secondary | ICD-10-CM | POA: Diagnosis not present

## 2021-07-09 DIAGNOSIS — M726 Necrotizing fasciitis: Secondary | ICD-10-CM | POA: Diagnosis not present

## 2021-07-09 DIAGNOSIS — N179 Acute kidney failure, unspecified: Secondary | ICD-10-CM | POA: Diagnosis not present

## 2021-07-09 DIAGNOSIS — J9601 Acute respiratory failure with hypoxia: Secondary | ICD-10-CM | POA: Diagnosis not present

## 2021-07-10 DIAGNOSIS — N179 Acute kidney failure, unspecified: Secondary | ICD-10-CM | POA: Diagnosis not present

## 2021-07-10 DIAGNOSIS — J9621 Acute and chronic respiratory failure with hypoxia: Secondary | ICD-10-CM | POA: Diagnosis not present

## 2021-07-10 DIAGNOSIS — S81802A Unspecified open wound, left lower leg, initial encounter: Secondary | ICD-10-CM | POA: Diagnosis not present

## 2021-07-10 DIAGNOSIS — G9341 Metabolic encephalopathy: Secondary | ICD-10-CM | POA: Diagnosis not present

## 2021-07-10 DIAGNOSIS — I10 Essential (primary) hypertension: Secondary | ICD-10-CM | POA: Diagnosis not present

## 2021-07-10 DIAGNOSIS — E119 Type 2 diabetes mellitus without complications: Secondary | ICD-10-CM | POA: Diagnosis not present

## 2021-07-10 DIAGNOSIS — I9589 Other hypotension: Secondary | ICD-10-CM | POA: Diagnosis not present

## 2021-07-10 DIAGNOSIS — S066X0A Traumatic subarachnoid hemorrhage without loss of consciousness, initial encounter: Secondary | ICD-10-CM | POA: Diagnosis not present

## 2021-07-10 DIAGNOSIS — R6520 Severe sepsis without septic shock: Secondary | ICD-10-CM | POA: Diagnosis not present

## 2021-07-10 DIAGNOSIS — F1120 Opioid dependence, uncomplicated: Secondary | ICD-10-CM | POA: Diagnosis not present

## 2021-07-10 DIAGNOSIS — I480 Paroxysmal atrial fibrillation: Secondary | ICD-10-CM | POA: Diagnosis not present

## 2021-07-10 DIAGNOSIS — D649 Anemia, unspecified: Secondary | ICD-10-CM | POA: Diagnosis not present

## 2021-07-10 DIAGNOSIS — N2581 Secondary hyperparathyroidism of renal origin: Secondary | ICD-10-CM | POA: Diagnosis not present

## 2021-07-10 DIAGNOSIS — J9690 Respiratory failure, unspecified, unspecified whether with hypoxia or hypercapnia: Secondary | ICD-10-CM | POA: Diagnosis not present

## 2021-07-10 DIAGNOSIS — M726 Necrotizing fasciitis: Secondary | ICD-10-CM | POA: Diagnosis not present

## 2021-07-10 DIAGNOSIS — J9601 Acute respiratory failure with hypoxia: Secondary | ICD-10-CM | POA: Diagnosis not present

## 2021-07-11 DIAGNOSIS — I480 Paroxysmal atrial fibrillation: Secondary | ICD-10-CM | POA: Diagnosis not present

## 2021-07-11 DIAGNOSIS — S066X0A Traumatic subarachnoid hemorrhage without loss of consciousness, initial encounter: Secondary | ICD-10-CM | POA: Diagnosis not present

## 2021-07-11 DIAGNOSIS — D649 Anemia, unspecified: Secondary | ICD-10-CM | POA: Diagnosis not present

## 2021-07-11 DIAGNOSIS — I9589 Other hypotension: Secondary | ICD-10-CM | POA: Diagnosis not present

## 2021-07-11 DIAGNOSIS — J9601 Acute respiratory failure with hypoxia: Secondary | ICD-10-CM | POA: Diagnosis not present

## 2021-07-11 DIAGNOSIS — R0989 Other specified symptoms and signs involving the circulatory and respiratory systems: Secondary | ICD-10-CM | POA: Diagnosis not present

## 2021-07-11 DIAGNOSIS — M726 Necrotizing fasciitis: Secondary | ICD-10-CM | POA: Diagnosis not present

## 2021-07-11 DIAGNOSIS — Z9981 Dependence on supplemental oxygen: Secondary | ICD-10-CM | POA: Diagnosis not present

## 2021-07-11 DIAGNOSIS — N179 Acute kidney failure, unspecified: Secondary | ICD-10-CM | POA: Diagnosis not present

## 2021-07-11 DIAGNOSIS — N2581 Secondary hyperparathyroidism of renal origin: Secondary | ICD-10-CM | POA: Diagnosis not present

## 2021-07-11 DIAGNOSIS — J9621 Acute and chronic respiratory failure with hypoxia: Secondary | ICD-10-CM | POA: Diagnosis not present

## 2021-07-12 DIAGNOSIS — N2581 Secondary hyperparathyroidism of renal origin: Secondary | ICD-10-CM | POA: Diagnosis not present

## 2021-07-12 DIAGNOSIS — I480 Paroxysmal atrial fibrillation: Secondary | ICD-10-CM | POA: Diagnosis not present

## 2021-07-12 DIAGNOSIS — J9621 Acute and chronic respiratory failure with hypoxia: Secondary | ICD-10-CM | POA: Diagnosis not present

## 2021-07-12 DIAGNOSIS — J9690 Respiratory failure, unspecified, unspecified whether with hypoxia or hypercapnia: Secondary | ICD-10-CM | POA: Diagnosis not present

## 2021-07-12 DIAGNOSIS — E119 Type 2 diabetes mellitus without complications: Secondary | ICD-10-CM | POA: Diagnosis not present

## 2021-07-12 DIAGNOSIS — Z9981 Dependence on supplemental oxygen: Secondary | ICD-10-CM | POA: Diagnosis not present

## 2021-07-12 DIAGNOSIS — D649 Anemia, unspecified: Secondary | ICD-10-CM | POA: Diagnosis not present

## 2021-07-12 DIAGNOSIS — S81802A Unspecified open wound, left lower leg, initial encounter: Secondary | ICD-10-CM | POA: Diagnosis not present

## 2021-07-12 DIAGNOSIS — M726 Necrotizing fasciitis: Secondary | ICD-10-CM | POA: Diagnosis not present

## 2021-07-12 DIAGNOSIS — F1120 Opioid dependence, uncomplicated: Secondary | ICD-10-CM | POA: Diagnosis not present

## 2021-07-12 DIAGNOSIS — J9601 Acute respiratory failure with hypoxia: Secondary | ICD-10-CM | POA: Diagnosis not present

## 2021-07-12 DIAGNOSIS — N179 Acute kidney failure, unspecified: Secondary | ICD-10-CM | POA: Diagnosis not present

## 2021-07-12 DIAGNOSIS — S066X0A Traumatic subarachnoid hemorrhage without loss of consciousness, initial encounter: Secondary | ICD-10-CM | POA: Diagnosis not present

## 2021-07-12 DIAGNOSIS — I10 Essential (primary) hypertension: Secondary | ICD-10-CM | POA: Diagnosis not present

## 2021-07-12 DIAGNOSIS — I9589 Other hypotension: Secondary | ICD-10-CM | POA: Diagnosis not present

## 2021-07-13 DIAGNOSIS — Z9981 Dependence on supplemental oxygen: Secondary | ICD-10-CM | POA: Diagnosis not present

## 2021-07-13 DIAGNOSIS — I4891 Unspecified atrial fibrillation: Secondary | ICD-10-CM | POA: Diagnosis not present

## 2021-07-13 DIAGNOSIS — M726 Necrotizing fasciitis: Secondary | ICD-10-CM | POA: Diagnosis not present

## 2021-07-13 DIAGNOSIS — J9621 Acute and chronic respiratory failure with hypoxia: Secondary | ICD-10-CM | POA: Diagnosis not present

## 2021-07-13 DIAGNOSIS — J189 Pneumonia, unspecified organism: Secondary | ICD-10-CM | POA: Diagnosis not present

## 2021-07-14 DIAGNOSIS — Z9981 Dependence on supplemental oxygen: Secondary | ICD-10-CM | POA: Diagnosis not present

## 2021-07-14 DIAGNOSIS — J9621 Acute and chronic respiratory failure with hypoxia: Secondary | ICD-10-CM | POA: Diagnosis not present

## 2021-07-14 DIAGNOSIS — J189 Pneumonia, unspecified organism: Secondary | ICD-10-CM | POA: Diagnosis not present

## 2021-07-14 DIAGNOSIS — M726 Necrotizing fasciitis: Secondary | ICD-10-CM | POA: Diagnosis not present

## 2021-07-14 DIAGNOSIS — I4891 Unspecified atrial fibrillation: Secondary | ICD-10-CM | POA: Diagnosis not present

## 2021-07-15 DIAGNOSIS — J9621 Acute and chronic respiratory failure with hypoxia: Secondary | ICD-10-CM | POA: Diagnosis not present

## 2021-07-15 DIAGNOSIS — D649 Anemia, unspecified: Secondary | ICD-10-CM | POA: Diagnosis not present

## 2021-07-15 DIAGNOSIS — I9589 Other hypotension: Secondary | ICD-10-CM | POA: Diagnosis not present

## 2021-07-15 DIAGNOSIS — J9690 Respiratory failure, unspecified, unspecified whether with hypoxia or hypercapnia: Secondary | ICD-10-CM | POA: Diagnosis not present

## 2021-07-15 DIAGNOSIS — F1120 Opioid dependence, uncomplicated: Secondary | ICD-10-CM | POA: Diagnosis not present

## 2021-07-15 DIAGNOSIS — Z9981 Dependence on supplemental oxygen: Secondary | ICD-10-CM | POA: Diagnosis not present

## 2021-07-15 DIAGNOSIS — N2581 Secondary hyperparathyroidism of renal origin: Secondary | ICD-10-CM | POA: Diagnosis not present

## 2021-07-15 DIAGNOSIS — I10 Essential (primary) hypertension: Secondary | ICD-10-CM | POA: Diagnosis not present

## 2021-07-15 DIAGNOSIS — M726 Necrotizing fasciitis: Secondary | ICD-10-CM | POA: Diagnosis not present

## 2021-07-15 DIAGNOSIS — E119 Type 2 diabetes mellitus without complications: Secondary | ICD-10-CM | POA: Diagnosis not present

## 2021-07-15 DIAGNOSIS — N179 Acute kidney failure, unspecified: Secondary | ICD-10-CM | POA: Diagnosis not present

## 2021-07-15 DIAGNOSIS — S81802A Unspecified open wound, left lower leg, initial encounter: Secondary | ICD-10-CM | POA: Diagnosis not present

## 2021-07-15 DIAGNOSIS — I480 Paroxysmal atrial fibrillation: Secondary | ICD-10-CM | POA: Diagnosis not present

## 2021-07-15 DIAGNOSIS — R0989 Other specified symptoms and signs involving the circulatory and respiratory systems: Secondary | ICD-10-CM | POA: Diagnosis not present

## 2021-07-16 DIAGNOSIS — J9621 Acute and chronic respiratory failure with hypoxia: Secondary | ICD-10-CM | POA: Diagnosis not present

## 2021-07-16 DIAGNOSIS — D649 Anemia, unspecified: Secondary | ICD-10-CM | POA: Diagnosis not present

## 2021-07-16 DIAGNOSIS — N2581 Secondary hyperparathyroidism of renal origin: Secondary | ICD-10-CM | POA: Diagnosis not present

## 2021-07-16 DIAGNOSIS — I480 Paroxysmal atrial fibrillation: Secondary | ICD-10-CM | POA: Diagnosis not present

## 2021-07-16 DIAGNOSIS — N179 Acute kidney failure, unspecified: Secondary | ICD-10-CM | POA: Diagnosis not present

## 2021-07-16 DIAGNOSIS — Z9981 Dependence on supplemental oxygen: Secondary | ICD-10-CM | POA: Diagnosis not present

## 2021-07-16 DIAGNOSIS — I9589 Other hypotension: Secondary | ICD-10-CM | POA: Diagnosis not present

## 2021-07-17 DIAGNOSIS — M726 Necrotizing fasciitis: Secondary | ICD-10-CM | POA: Diagnosis not present

## 2021-07-17 DIAGNOSIS — D649 Anemia, unspecified: Secondary | ICD-10-CM | POA: Diagnosis not present

## 2021-07-17 DIAGNOSIS — I10 Essential (primary) hypertension: Secondary | ICD-10-CM | POA: Diagnosis not present

## 2021-07-17 DIAGNOSIS — S81802A Unspecified open wound, left lower leg, initial encounter: Secondary | ICD-10-CM | POA: Diagnosis not present

## 2021-07-17 DIAGNOSIS — I9589 Other hypotension: Secondary | ICD-10-CM | POA: Diagnosis not present

## 2021-07-17 DIAGNOSIS — J9621 Acute and chronic respiratory failure with hypoxia: Secondary | ICD-10-CM | POA: Diagnosis not present

## 2021-07-17 DIAGNOSIS — N179 Acute kidney failure, unspecified: Secondary | ICD-10-CM | POA: Diagnosis not present

## 2021-07-17 DIAGNOSIS — N2581 Secondary hyperparathyroidism of renal origin: Secondary | ICD-10-CM | POA: Diagnosis not present

## 2021-07-17 DIAGNOSIS — I480 Paroxysmal atrial fibrillation: Secondary | ICD-10-CM | POA: Diagnosis not present

## 2021-07-17 DIAGNOSIS — J9690 Respiratory failure, unspecified, unspecified whether with hypoxia or hypercapnia: Secondary | ICD-10-CM | POA: Diagnosis not present

## 2021-07-17 DIAGNOSIS — F1120 Opioid dependence, uncomplicated: Secondary | ICD-10-CM | POA: Diagnosis not present

## 2021-07-17 DIAGNOSIS — Z9981 Dependence on supplemental oxygen: Secondary | ICD-10-CM | POA: Diagnosis not present

## 2021-07-17 DIAGNOSIS — E119 Type 2 diabetes mellitus without complications: Secondary | ICD-10-CM | POA: Diagnosis not present

## 2021-07-18 ENCOUNTER — Other Ambulatory Visit: Payer: Self-pay | Admitting: Family Medicine

## 2021-07-18 DIAGNOSIS — I9589 Other hypotension: Secondary | ICD-10-CM | POA: Diagnosis not present

## 2021-07-18 DIAGNOSIS — R6520 Severe sepsis without septic shock: Secondary | ICD-10-CM

## 2021-07-18 DIAGNOSIS — N2581 Secondary hyperparathyroidism of renal origin: Secondary | ICD-10-CM | POA: Diagnosis not present

## 2021-07-18 DIAGNOSIS — D649 Anemia, unspecified: Secondary | ICD-10-CM | POA: Diagnosis not present

## 2021-07-18 DIAGNOSIS — I480 Paroxysmal atrial fibrillation: Secondary | ICD-10-CM | POA: Diagnosis not present

## 2021-07-18 DIAGNOSIS — N179 Acute kidney failure, unspecified: Secondary | ICD-10-CM | POA: Diagnosis not present

## 2021-07-18 DIAGNOSIS — J9621 Acute and chronic respiratory failure with hypoxia: Secondary | ICD-10-CM

## 2021-07-18 DIAGNOSIS — G9341 Metabolic encephalopathy: Secondary | ICD-10-CM

## 2021-07-19 DIAGNOSIS — M726 Necrotizing fasciitis: Secondary | ICD-10-CM | POA: Diagnosis not present

## 2021-07-19 DIAGNOSIS — I10 Essential (primary) hypertension: Secondary | ICD-10-CM | POA: Diagnosis not present

## 2021-07-19 DIAGNOSIS — J9690 Respiratory failure, unspecified, unspecified whether with hypoxia or hypercapnia: Secondary | ICD-10-CM | POA: Diagnosis not present

## 2021-07-19 DIAGNOSIS — D649 Anemia, unspecified: Secondary | ICD-10-CM | POA: Diagnosis not present

## 2021-07-19 DIAGNOSIS — S81802A Unspecified open wound, left lower leg, initial encounter: Secondary | ICD-10-CM | POA: Diagnosis not present

## 2021-07-19 DIAGNOSIS — N179 Acute kidney failure, unspecified: Secondary | ICD-10-CM | POA: Diagnosis not present

## 2021-07-19 DIAGNOSIS — E119 Type 2 diabetes mellitus without complications: Secondary | ICD-10-CM | POA: Diagnosis not present

## 2021-07-19 DIAGNOSIS — N2581 Secondary hyperparathyroidism of renal origin: Secondary | ICD-10-CM | POA: Diagnosis not present

## 2021-07-19 DIAGNOSIS — F1120 Opioid dependence, uncomplicated: Secondary | ICD-10-CM | POA: Diagnosis not present

## 2021-07-19 DIAGNOSIS — I9589 Other hypotension: Secondary | ICD-10-CM | POA: Diagnosis not present

## 2021-07-19 DIAGNOSIS — I480 Paroxysmal atrial fibrillation: Secondary | ICD-10-CM | POA: Diagnosis not present

## 2021-07-20 DIAGNOSIS — R0902 Hypoxemia: Secondary | ICD-10-CM | POA: Diagnosis not present

## 2021-07-20 DIAGNOSIS — R1310 Dysphagia, unspecified: Secondary | ICD-10-CM | POA: Diagnosis not present

## 2021-07-20 DIAGNOSIS — G0490 Encephalitis and encephalomyelitis, unspecified: Secondary | ICD-10-CM | POA: Diagnosis not present

## 2021-07-20 DIAGNOSIS — I4891 Unspecified atrial fibrillation: Secondary | ICD-10-CM | POA: Diagnosis not present

## 2021-07-21 DIAGNOSIS — G0490 Encephalitis and encephalomyelitis, unspecified: Secondary | ICD-10-CM | POA: Diagnosis not present

## 2021-07-21 DIAGNOSIS — R6520 Severe sepsis without septic shock: Secondary | ICD-10-CM

## 2021-07-21 DIAGNOSIS — G9341 Metabolic encephalopathy: Secondary | ICD-10-CM

## 2021-07-21 DIAGNOSIS — R1310 Dysphagia, unspecified: Secondary | ICD-10-CM | POA: Diagnosis not present

## 2021-07-21 DIAGNOSIS — I480 Paroxysmal atrial fibrillation: Secondary | ICD-10-CM

## 2021-07-21 DIAGNOSIS — I4891 Unspecified atrial fibrillation: Secondary | ICD-10-CM | POA: Diagnosis not present

## 2021-07-21 DIAGNOSIS — J9621 Acute and chronic respiratory failure with hypoxia: Secondary | ICD-10-CM

## 2021-07-21 DIAGNOSIS — R0902 Hypoxemia: Secondary | ICD-10-CM | POA: Diagnosis not present

## 2021-07-22 DIAGNOSIS — G9341 Metabolic encephalopathy: Secondary | ICD-10-CM

## 2021-07-22 DIAGNOSIS — J9621 Acute and chronic respiratory failure with hypoxia: Secondary | ICD-10-CM

## 2021-07-22 DIAGNOSIS — I480 Paroxysmal atrial fibrillation: Secondary | ICD-10-CM

## 2021-07-22 DIAGNOSIS — R6520 Severe sepsis without septic shock: Secondary | ICD-10-CM

## 2021-07-23 DIAGNOSIS — I480 Paroxysmal atrial fibrillation: Secondary | ICD-10-CM

## 2021-07-23 DIAGNOSIS — R6520 Severe sepsis without septic shock: Secondary | ICD-10-CM

## 2021-07-23 DIAGNOSIS — G9341 Metabolic encephalopathy: Secondary | ICD-10-CM

## 2021-07-23 DIAGNOSIS — J9621 Acute and chronic respiratory failure with hypoxia: Secondary | ICD-10-CM

## 2021-07-24 DIAGNOSIS — G9341 Metabolic encephalopathy: Secondary | ICD-10-CM

## 2021-07-24 DIAGNOSIS — J9621 Acute and chronic respiratory failure with hypoxia: Secondary | ICD-10-CM

## 2021-07-24 DIAGNOSIS — R6520 Severe sepsis without septic shock: Secondary | ICD-10-CM

## 2021-07-24 DIAGNOSIS — I480 Paroxysmal atrial fibrillation: Secondary | ICD-10-CM

## 2021-07-25 DIAGNOSIS — R6520 Severe sepsis without septic shock: Secondary | ICD-10-CM

## 2021-07-25 DIAGNOSIS — I480 Paroxysmal atrial fibrillation: Secondary | ICD-10-CM

## 2021-07-25 DIAGNOSIS — J9621 Acute and chronic respiratory failure with hypoxia: Secondary | ICD-10-CM

## 2021-07-25 DIAGNOSIS — G9341 Metabolic encephalopathy: Secondary | ICD-10-CM

## 2021-07-26 DIAGNOSIS — J9621 Acute and chronic respiratory failure with hypoxia: Secondary | ICD-10-CM

## 2021-07-26 DIAGNOSIS — R6520 Severe sepsis without septic shock: Secondary | ICD-10-CM

## 2021-07-26 DIAGNOSIS — G9341 Metabolic encephalopathy: Secondary | ICD-10-CM

## 2021-07-26 DIAGNOSIS — I480 Paroxysmal atrial fibrillation: Secondary | ICD-10-CM

## 2021-08-01 DIAGNOSIS — J9621 Acute and chronic respiratory failure with hypoxia: Secondary | ICD-10-CM

## 2021-08-01 DIAGNOSIS — G9341 Metabolic encephalopathy: Secondary | ICD-10-CM

## 2021-08-01 DIAGNOSIS — R6520 Severe sepsis without septic shock: Secondary | ICD-10-CM

## 2021-08-01 DIAGNOSIS — I480 Paroxysmal atrial fibrillation: Secondary | ICD-10-CM

## 2021-08-03 DIAGNOSIS — G9341 Metabolic encephalopathy: Secondary | ICD-10-CM

## 2021-08-03 DIAGNOSIS — J9621 Acute and chronic respiratory failure with hypoxia: Secondary | ICD-10-CM

## 2021-08-03 DIAGNOSIS — I480 Paroxysmal atrial fibrillation: Secondary | ICD-10-CM

## 2021-08-03 DIAGNOSIS — R6520 Severe sepsis without septic shock: Secondary | ICD-10-CM

## 2021-08-04 DIAGNOSIS — G9341 Metabolic encephalopathy: Secondary | ICD-10-CM

## 2021-08-04 DIAGNOSIS — J9621 Acute and chronic respiratory failure with hypoxia: Secondary | ICD-10-CM

## 2021-08-04 DIAGNOSIS — R6520 Severe sepsis without septic shock: Secondary | ICD-10-CM

## 2021-08-04 DIAGNOSIS — I480 Paroxysmal atrial fibrillation: Secondary | ICD-10-CM

## 2021-08-05 DIAGNOSIS — G9341 Metabolic encephalopathy: Secondary | ICD-10-CM

## 2021-08-05 DIAGNOSIS — J9621 Acute and chronic respiratory failure with hypoxia: Secondary | ICD-10-CM

## 2021-08-05 DIAGNOSIS — I480 Paroxysmal atrial fibrillation: Secondary | ICD-10-CM

## 2021-08-05 DIAGNOSIS — R6520 Severe sepsis without septic shock: Secondary | ICD-10-CM

## 2021-08-06 DIAGNOSIS — G9341 Metabolic encephalopathy: Secondary | ICD-10-CM

## 2021-08-06 DIAGNOSIS — R6520 Severe sepsis without septic shock: Secondary | ICD-10-CM

## 2021-08-06 DIAGNOSIS — J9621 Acute and chronic respiratory failure with hypoxia: Secondary | ICD-10-CM

## 2021-08-06 DIAGNOSIS — I480 Paroxysmal atrial fibrillation: Secondary | ICD-10-CM

## 2021-08-07 DIAGNOSIS — I480 Paroxysmal atrial fibrillation: Secondary | ICD-10-CM

## 2021-08-07 DIAGNOSIS — G9341 Metabolic encephalopathy: Secondary | ICD-10-CM

## 2021-08-07 DIAGNOSIS — J9621 Acute and chronic respiratory failure with hypoxia: Secondary | ICD-10-CM

## 2021-08-07 DIAGNOSIS — R6520 Severe sepsis without septic shock: Secondary | ICD-10-CM

## 2021-08-15 DIAGNOSIS — R6520 Severe sepsis without septic shock: Secondary | ICD-10-CM

## 2021-08-15 DIAGNOSIS — G9341 Metabolic encephalopathy: Secondary | ICD-10-CM

## 2021-08-15 DIAGNOSIS — J9621 Acute and chronic respiratory failure with hypoxia: Secondary | ICD-10-CM

## 2021-08-15 DIAGNOSIS — I480 Paroxysmal atrial fibrillation: Secondary | ICD-10-CM

## 2021-08-16 DIAGNOSIS — G4733 Obstructive sleep apnea (adult) (pediatric): Secondary | ICD-10-CM | POA: Diagnosis not present

## 2021-08-16 DIAGNOSIS — I480 Paroxysmal atrial fibrillation: Secondary | ICD-10-CM

## 2021-08-16 DIAGNOSIS — J9621 Acute and chronic respiratory failure with hypoxia: Secondary | ICD-10-CM

## 2021-08-16 DIAGNOSIS — R6520 Severe sepsis without septic shock: Secondary | ICD-10-CM

## 2021-08-16 DIAGNOSIS — G9341 Metabolic encephalopathy: Secondary | ICD-10-CM

## 2021-08-19 DIAGNOSIS — J9621 Acute and chronic respiratory failure with hypoxia: Secondary | ICD-10-CM

## 2021-08-19 DIAGNOSIS — R6520 Severe sepsis without septic shock: Secondary | ICD-10-CM

## 2021-08-19 DIAGNOSIS — G9341 Metabolic encephalopathy: Secondary | ICD-10-CM

## 2021-08-19 DIAGNOSIS — I480 Paroxysmal atrial fibrillation: Secondary | ICD-10-CM

## 2021-08-20 DIAGNOSIS — R6520 Severe sepsis without septic shock: Secondary | ICD-10-CM

## 2021-08-20 DIAGNOSIS — I480 Paroxysmal atrial fibrillation: Secondary | ICD-10-CM

## 2021-08-20 DIAGNOSIS — J9621 Acute and chronic respiratory failure with hypoxia: Secondary | ICD-10-CM

## 2021-08-20 DIAGNOSIS — G9341 Metabolic encephalopathy: Secondary | ICD-10-CM

## 2021-08-21 DIAGNOSIS — R6520 Severe sepsis without septic shock: Secondary | ICD-10-CM

## 2021-08-21 DIAGNOSIS — J9621 Acute and chronic respiratory failure with hypoxia: Secondary | ICD-10-CM

## 2021-08-21 DIAGNOSIS — I480 Paroxysmal atrial fibrillation: Secondary | ICD-10-CM

## 2021-08-21 DIAGNOSIS — G9341 Metabolic encephalopathy: Secondary | ICD-10-CM

## 2021-08-27 DIAGNOSIS — R4189 Other symptoms and signs involving cognitive functions and awareness: Secondary | ICD-10-CM | POA: Diagnosis not present

## 2021-08-27 DIAGNOSIS — M726 Necrotizing fasciitis: Secondary | ICD-10-CM | POA: Diagnosis not present

## 2021-08-27 DIAGNOSIS — N179 Acute kidney failure, unspecified: Secondary | ICD-10-CM | POA: Diagnosis not present

## 2021-08-27 DIAGNOSIS — R5381 Other malaise: Secondary | ICD-10-CM | POA: Diagnosis not present

## 2021-08-27 DIAGNOSIS — J9601 Acute respiratory failure with hypoxia: Secondary | ICD-10-CM | POA: Diagnosis not present

## 2021-08-27 DIAGNOSIS — S066X0A Traumatic subarachnoid hemorrhage without loss of consciousness, initial encounter: Secondary | ICD-10-CM | POA: Diagnosis not present

## 2021-08-27 DIAGNOSIS — R2689 Other abnormalities of gait and mobility: Secondary | ICD-10-CM | POA: Diagnosis not present

## 2021-08-27 DIAGNOSIS — M6281 Muscle weakness (generalized): Secondary | ICD-10-CM | POA: Diagnosis not present

## 2021-08-28 DIAGNOSIS — I10 Essential (primary) hypertension: Secondary | ICD-10-CM | POA: Diagnosis not present

## 2021-08-28 DIAGNOSIS — I4891 Unspecified atrial fibrillation: Secondary | ICD-10-CM | POA: Diagnosis not present

## 2021-08-28 DIAGNOSIS — J189 Pneumonia, unspecified organism: Secondary | ICD-10-CM | POA: Diagnosis not present

## 2021-08-28 DIAGNOSIS — N2581 Secondary hyperparathyroidism of renal origin: Secondary | ICD-10-CM | POA: Diagnosis not present

## 2021-08-28 DIAGNOSIS — S81802A Unspecified open wound, left lower leg, initial encounter: Secondary | ICD-10-CM | POA: Diagnosis not present

## 2021-08-28 DIAGNOSIS — J9690 Respiratory failure, unspecified, unspecified whether with hypoxia or hypercapnia: Secondary | ICD-10-CM | POA: Diagnosis not present

## 2021-08-28 DIAGNOSIS — E119 Type 2 diabetes mellitus without complications: Secondary | ICD-10-CM | POA: Diagnosis not present

## 2021-08-28 DIAGNOSIS — I9589 Other hypotension: Secondary | ICD-10-CM | POA: Diagnosis not present

## 2021-08-28 DIAGNOSIS — N179 Acute kidney failure, unspecified: Secondary | ICD-10-CM | POA: Diagnosis not present

## 2021-08-28 DIAGNOSIS — F1120 Opioid dependence, uncomplicated: Secondary | ICD-10-CM | POA: Diagnosis not present

## 2021-08-28 DIAGNOSIS — M726 Necrotizing fasciitis: Secondary | ICD-10-CM | POA: Diagnosis not present

## 2021-08-28 DIAGNOSIS — I480 Paroxysmal atrial fibrillation: Secondary | ICD-10-CM | POA: Diagnosis not present

## 2021-08-28 DIAGNOSIS — D649 Anemia, unspecified: Secondary | ICD-10-CM | POA: Diagnosis not present

## 2021-08-29 DIAGNOSIS — M726 Necrotizing fasciitis: Secondary | ICD-10-CM | POA: Diagnosis not present

## 2021-08-29 DIAGNOSIS — J9601 Acute respiratory failure with hypoxia: Secondary | ICD-10-CM | POA: Diagnosis not present

## 2021-08-29 DIAGNOSIS — S066X0A Traumatic subarachnoid hemorrhage without loss of consciousness, initial encounter: Secondary | ICD-10-CM | POA: Diagnosis not present

## 2021-08-29 DIAGNOSIS — R6520 Severe sepsis without septic shock: Secondary | ICD-10-CM | POA: Diagnosis not present

## 2021-08-29 DIAGNOSIS — N179 Acute kidney failure, unspecified: Secondary | ICD-10-CM | POA: Diagnosis not present

## 2021-08-29 DIAGNOSIS — N2581 Secondary hyperparathyroidism of renal origin: Secondary | ICD-10-CM | POA: Diagnosis not present

## 2021-08-29 DIAGNOSIS — J9621 Acute and chronic respiratory failure with hypoxia: Secondary | ICD-10-CM | POA: Diagnosis not present

## 2021-08-29 DIAGNOSIS — I480 Paroxysmal atrial fibrillation: Secondary | ICD-10-CM | POA: Diagnosis not present

## 2021-08-29 DIAGNOSIS — G9341 Metabolic encephalopathy: Secondary | ICD-10-CM | POA: Diagnosis not present

## 2021-08-29 DIAGNOSIS — I9589 Other hypotension: Secondary | ICD-10-CM | POA: Diagnosis not present

## 2021-08-29 DIAGNOSIS — D649 Anemia, unspecified: Secondary | ICD-10-CM | POA: Diagnosis not present

## 2021-08-30 DIAGNOSIS — J9601 Acute respiratory failure with hypoxia: Secondary | ICD-10-CM | POA: Diagnosis not present

## 2021-08-30 DIAGNOSIS — N179 Acute kidney failure, unspecified: Secondary | ICD-10-CM | POA: Diagnosis not present

## 2021-08-30 DIAGNOSIS — R6520 Severe sepsis without septic shock: Secondary | ICD-10-CM | POA: Diagnosis not present

## 2021-08-30 DIAGNOSIS — E119 Type 2 diabetes mellitus without complications: Secondary | ICD-10-CM | POA: Diagnosis not present

## 2021-08-30 DIAGNOSIS — N2581 Secondary hyperparathyroidism of renal origin: Secondary | ICD-10-CM | POA: Diagnosis not present

## 2021-08-30 DIAGNOSIS — J9621 Acute and chronic respiratory failure with hypoxia: Secondary | ICD-10-CM | POA: Diagnosis not present

## 2021-08-30 DIAGNOSIS — D649 Anemia, unspecified: Secondary | ICD-10-CM | POA: Diagnosis not present

## 2021-08-30 DIAGNOSIS — I9589 Other hypotension: Secondary | ICD-10-CM | POA: Diagnosis not present

## 2021-08-30 DIAGNOSIS — I480 Paroxysmal atrial fibrillation: Secondary | ICD-10-CM | POA: Diagnosis not present

## 2021-08-30 DIAGNOSIS — G9341 Metabolic encephalopathy: Secondary | ICD-10-CM | POA: Diagnosis not present

## 2021-08-30 DIAGNOSIS — I10 Essential (primary) hypertension: Secondary | ICD-10-CM | POA: Diagnosis not present

## 2021-08-30 DIAGNOSIS — S066X0A Traumatic subarachnoid hemorrhage without loss of consciousness, initial encounter: Secondary | ICD-10-CM | POA: Diagnosis not present

## 2021-08-30 DIAGNOSIS — J9690 Respiratory failure, unspecified, unspecified whether with hypoxia or hypercapnia: Secondary | ICD-10-CM | POA: Diagnosis not present

## 2021-08-30 DIAGNOSIS — S81802A Unspecified open wound, left lower leg, initial encounter: Secondary | ICD-10-CM | POA: Diagnosis not present

## 2021-08-30 DIAGNOSIS — M726 Necrotizing fasciitis: Secondary | ICD-10-CM | POA: Diagnosis not present

## 2021-08-30 DIAGNOSIS — F1120 Opioid dependence, uncomplicated: Secondary | ICD-10-CM | POA: Diagnosis not present

## 2021-08-31 DIAGNOSIS — J9621 Acute and chronic respiratory failure with hypoxia: Secondary | ICD-10-CM | POA: Diagnosis not present

## 2021-08-31 DIAGNOSIS — I480 Paroxysmal atrial fibrillation: Secondary | ICD-10-CM | POA: Diagnosis not present

## 2021-08-31 DIAGNOSIS — M726 Necrotizing fasciitis: Secondary | ICD-10-CM | POA: Diagnosis not present

## 2021-08-31 DIAGNOSIS — J189 Pneumonia, unspecified organism: Secondary | ICD-10-CM | POA: Diagnosis not present

## 2021-08-31 DIAGNOSIS — G9341 Metabolic encephalopathy: Secondary | ICD-10-CM | POA: Diagnosis not present

## 2021-08-31 DIAGNOSIS — I4891 Unspecified atrial fibrillation: Secondary | ICD-10-CM | POA: Diagnosis not present

## 2021-08-31 DIAGNOSIS — R6520 Severe sepsis without septic shock: Secondary | ICD-10-CM | POA: Diagnosis not present

## 2021-09-01 DIAGNOSIS — I4891 Unspecified atrial fibrillation: Secondary | ICD-10-CM | POA: Diagnosis not present

## 2021-09-01 DIAGNOSIS — J189 Pneumonia, unspecified organism: Secondary | ICD-10-CM | POA: Diagnosis not present

## 2021-09-01 DIAGNOSIS — M726 Necrotizing fasciitis: Secondary | ICD-10-CM | POA: Diagnosis not present

## 2021-09-02 DIAGNOSIS — E119 Type 2 diabetes mellitus without complications: Secondary | ICD-10-CM | POA: Diagnosis not present

## 2021-09-02 DIAGNOSIS — M726 Necrotizing fasciitis: Secondary | ICD-10-CM | POA: Diagnosis not present

## 2021-09-02 DIAGNOSIS — J449 Chronic obstructive pulmonary disease, unspecified: Secondary | ICD-10-CM | POA: Diagnosis not present

## 2021-09-02 DIAGNOSIS — G4733 Obstructive sleep apnea (adult) (pediatric): Secondary | ICD-10-CM | POA: Diagnosis not present

## 2021-09-02 DIAGNOSIS — F1120 Opioid dependence, uncomplicated: Secondary | ICD-10-CM | POA: Diagnosis not present

## 2021-09-02 DIAGNOSIS — D649 Anemia, unspecified: Secondary | ICD-10-CM | POA: Diagnosis not present

## 2021-09-02 DIAGNOSIS — H9192 Unspecified hearing loss, left ear: Secondary | ICD-10-CM | POA: Diagnosis not present

## 2021-09-02 DIAGNOSIS — I480 Paroxysmal atrial fibrillation: Secondary | ICD-10-CM | POA: Diagnosis not present

## 2021-09-02 DIAGNOSIS — N179 Acute kidney failure, unspecified: Secondary | ICD-10-CM | POA: Diagnosis not present

## 2021-09-02 DIAGNOSIS — J45909 Unspecified asthma, uncomplicated: Secondary | ICD-10-CM | POA: Diagnosis not present

## 2021-09-02 DIAGNOSIS — S81802A Unspecified open wound, left lower leg, initial encounter: Secondary | ICD-10-CM | POA: Diagnosis not present

## 2021-09-02 DIAGNOSIS — I9589 Other hypotension: Secondary | ICD-10-CM | POA: Diagnosis not present

## 2021-09-02 DIAGNOSIS — N2581 Secondary hyperparathyroidism of renal origin: Secondary | ICD-10-CM | POA: Diagnosis not present

## 2021-09-02 DIAGNOSIS — I10 Essential (primary) hypertension: Secondary | ICD-10-CM | POA: Diagnosis not present

## 2021-09-02 DIAGNOSIS — J9690 Respiratory failure, unspecified, unspecified whether with hypoxia or hypercapnia: Secondary | ICD-10-CM | POA: Diagnosis not present

## 2021-09-03 DIAGNOSIS — I480 Paroxysmal atrial fibrillation: Secondary | ICD-10-CM | POA: Diagnosis not present

## 2021-09-03 DIAGNOSIS — S066X0A Traumatic subarachnoid hemorrhage without loss of consciousness, initial encounter: Secondary | ICD-10-CM | POA: Diagnosis not present

## 2021-09-03 DIAGNOSIS — N179 Acute kidney failure, unspecified: Secondary | ICD-10-CM | POA: Diagnosis not present

## 2021-09-03 DIAGNOSIS — N2581 Secondary hyperparathyroidism of renal origin: Secondary | ICD-10-CM | POA: Diagnosis not present

## 2021-09-03 DIAGNOSIS — D649 Anemia, unspecified: Secondary | ICD-10-CM | POA: Diagnosis not present

## 2021-09-03 DIAGNOSIS — M726 Necrotizing fasciitis: Secondary | ICD-10-CM | POA: Diagnosis not present

## 2021-09-03 DIAGNOSIS — I9589 Other hypotension: Secondary | ICD-10-CM | POA: Diagnosis not present

## 2021-09-03 DIAGNOSIS — J9601 Acute respiratory failure with hypoxia: Secondary | ICD-10-CM | POA: Diagnosis not present

## 2021-09-04 DIAGNOSIS — M726 Necrotizing fasciitis: Secondary | ICD-10-CM | POA: Diagnosis not present

## 2021-09-04 DIAGNOSIS — J9601 Acute respiratory failure with hypoxia: Secondary | ICD-10-CM | POA: Diagnosis not present

## 2021-09-04 DIAGNOSIS — N2581 Secondary hyperparathyroidism of renal origin: Secondary | ICD-10-CM | POA: Diagnosis not present

## 2021-09-04 DIAGNOSIS — E119 Type 2 diabetes mellitus without complications: Secondary | ICD-10-CM | POA: Diagnosis not present

## 2021-09-04 DIAGNOSIS — I9589 Other hypotension: Secondary | ICD-10-CM | POA: Diagnosis not present

## 2021-09-04 DIAGNOSIS — N179 Acute kidney failure, unspecified: Secondary | ICD-10-CM | POA: Diagnosis not present

## 2021-09-04 DIAGNOSIS — I480 Paroxysmal atrial fibrillation: Secondary | ICD-10-CM | POA: Diagnosis not present

## 2021-09-04 DIAGNOSIS — D649 Anemia, unspecified: Secondary | ICD-10-CM | POA: Diagnosis not present

## 2021-09-04 DIAGNOSIS — J9690 Respiratory failure, unspecified, unspecified whether with hypoxia or hypercapnia: Secondary | ICD-10-CM | POA: Diagnosis not present

## 2021-09-04 DIAGNOSIS — F1120 Opioid dependence, uncomplicated: Secondary | ICD-10-CM | POA: Diagnosis not present

## 2021-09-04 DIAGNOSIS — S066X0A Traumatic subarachnoid hemorrhage without loss of consciousness, initial encounter: Secondary | ICD-10-CM | POA: Diagnosis not present

## 2021-09-04 DIAGNOSIS — S81802A Unspecified open wound, left lower leg, initial encounter: Secondary | ICD-10-CM | POA: Diagnosis not present

## 2021-09-04 DIAGNOSIS — I10 Essential (primary) hypertension: Secondary | ICD-10-CM | POA: Diagnosis not present

## 2021-09-05 DIAGNOSIS — N2581 Secondary hyperparathyroidism of renal origin: Secondary | ICD-10-CM | POA: Diagnosis not present

## 2021-09-05 DIAGNOSIS — N179 Acute kidney failure, unspecified: Secondary | ICD-10-CM | POA: Diagnosis not present

## 2021-09-05 DIAGNOSIS — J9601 Acute respiratory failure with hypoxia: Secondary | ICD-10-CM | POA: Diagnosis not present

## 2021-09-05 DIAGNOSIS — M726 Necrotizing fasciitis: Secondary | ICD-10-CM | POA: Diagnosis not present

## 2021-09-05 DIAGNOSIS — I9589 Other hypotension: Secondary | ICD-10-CM | POA: Diagnosis not present

## 2021-09-05 DIAGNOSIS — I480 Paroxysmal atrial fibrillation: Secondary | ICD-10-CM | POA: Diagnosis not present

## 2021-09-05 DIAGNOSIS — D649 Anemia, unspecified: Secondary | ICD-10-CM | POA: Diagnosis not present

## 2021-09-05 DIAGNOSIS — S066X0A Traumatic subarachnoid hemorrhage without loss of consciousness, initial encounter: Secondary | ICD-10-CM | POA: Diagnosis not present

## 2021-09-06 ENCOUNTER — Encounter: Payer: Self-pay | Admitting: Internal Medicine

## 2021-09-06 DIAGNOSIS — M726 Necrotizing fasciitis: Secondary | ICD-10-CM | POA: Diagnosis not present

## 2021-09-06 DIAGNOSIS — I10 Essential (primary) hypertension: Secondary | ICD-10-CM | POA: Diagnosis not present

## 2021-09-06 DIAGNOSIS — D649 Anemia, unspecified: Secondary | ICD-10-CM | POA: Diagnosis not present

## 2021-09-06 DIAGNOSIS — E119 Type 2 diabetes mellitus without complications: Secondary | ICD-10-CM | POA: Diagnosis not present

## 2021-09-06 DIAGNOSIS — J9601 Acute respiratory failure with hypoxia: Secondary | ICD-10-CM | POA: Diagnosis not present

## 2021-09-06 DIAGNOSIS — S81802A Unspecified open wound, left lower leg, initial encounter: Secondary | ICD-10-CM | POA: Diagnosis not present

## 2021-09-06 DIAGNOSIS — I9589 Other hypotension: Secondary | ICD-10-CM | POA: Diagnosis not present

## 2021-09-06 DIAGNOSIS — M6281 Muscle weakness (generalized): Secondary | ICD-10-CM | POA: Diagnosis not present

## 2021-09-06 DIAGNOSIS — R2689 Other abnormalities of gait and mobility: Secondary | ICD-10-CM | POA: Diagnosis not present

## 2021-09-06 DIAGNOSIS — R5381 Other malaise: Secondary | ICD-10-CM | POA: Diagnosis not present

## 2021-09-06 DIAGNOSIS — R4189 Other symptoms and signs involving cognitive functions and awareness: Secondary | ICD-10-CM | POA: Diagnosis not present

## 2021-09-06 DIAGNOSIS — F1120 Opioid dependence, uncomplicated: Secondary | ICD-10-CM | POA: Diagnosis not present

## 2021-09-06 DIAGNOSIS — S066X0A Traumatic subarachnoid hemorrhage without loss of consciousness, initial encounter: Secondary | ICD-10-CM | POA: Diagnosis not present

## 2021-09-06 DIAGNOSIS — J9690 Respiratory failure, unspecified, unspecified whether with hypoxia or hypercapnia: Secondary | ICD-10-CM | POA: Diagnosis not present

## 2021-09-06 DIAGNOSIS — I480 Paroxysmal atrial fibrillation: Secondary | ICD-10-CM | POA: Diagnosis not present

## 2021-09-06 DIAGNOSIS — N179 Acute kidney failure, unspecified: Secondary | ICD-10-CM | POA: Diagnosis not present

## 2021-09-06 DIAGNOSIS — N2581 Secondary hyperparathyroidism of renal origin: Secondary | ICD-10-CM | POA: Diagnosis not present

## 2021-09-07 DIAGNOSIS — J9601 Acute respiratory failure with hypoxia: Secondary | ICD-10-CM | POA: Diagnosis not present

## 2021-09-07 DIAGNOSIS — N179 Acute kidney failure, unspecified: Secondary | ICD-10-CM | POA: Diagnosis not present

## 2021-09-07 DIAGNOSIS — M726 Necrotizing fasciitis: Secondary | ICD-10-CM | POA: Diagnosis not present

## 2021-09-07 DIAGNOSIS — S066X0A Traumatic subarachnoid hemorrhage without loss of consciousness, initial encounter: Secondary | ICD-10-CM | POA: Diagnosis not present

## 2021-09-08 DIAGNOSIS — J9601 Acute respiratory failure with hypoxia: Secondary | ICD-10-CM | POA: Diagnosis not present

## 2021-09-08 DIAGNOSIS — M726 Necrotizing fasciitis: Secondary | ICD-10-CM | POA: Diagnosis not present

## 2021-09-08 DIAGNOSIS — N179 Acute kidney failure, unspecified: Secondary | ICD-10-CM | POA: Diagnosis not present

## 2021-09-08 DIAGNOSIS — S066X0A Traumatic subarachnoid hemorrhage without loss of consciousness, initial encounter: Secondary | ICD-10-CM | POA: Diagnosis not present

## 2021-09-09 DIAGNOSIS — S066X0A Traumatic subarachnoid hemorrhage without loss of consciousness, initial encounter: Secondary | ICD-10-CM | POA: Diagnosis not present

## 2021-09-09 DIAGNOSIS — N2581 Secondary hyperparathyroidism of renal origin: Secondary | ICD-10-CM | POA: Diagnosis not present

## 2021-09-09 DIAGNOSIS — I9589 Other hypotension: Secondary | ICD-10-CM | POA: Diagnosis not present

## 2021-09-09 DIAGNOSIS — S81802A Unspecified open wound, left lower leg, initial encounter: Secondary | ICD-10-CM | POA: Diagnosis not present

## 2021-09-09 DIAGNOSIS — I10 Essential (primary) hypertension: Secondary | ICD-10-CM | POA: Diagnosis not present

## 2021-09-09 DIAGNOSIS — N179 Acute kidney failure, unspecified: Secondary | ICD-10-CM | POA: Diagnosis not present

## 2021-09-09 DIAGNOSIS — M726 Necrotizing fasciitis: Secondary | ICD-10-CM | POA: Diagnosis not present

## 2021-09-09 DIAGNOSIS — F1120 Opioid dependence, uncomplicated: Secondary | ICD-10-CM | POA: Diagnosis not present

## 2021-09-09 DIAGNOSIS — D649 Anemia, unspecified: Secondary | ICD-10-CM | POA: Diagnosis not present

## 2021-09-09 DIAGNOSIS — J9690 Respiratory failure, unspecified, unspecified whether with hypoxia or hypercapnia: Secondary | ICD-10-CM | POA: Diagnosis not present

## 2021-09-09 DIAGNOSIS — I480 Paroxysmal atrial fibrillation: Secondary | ICD-10-CM | POA: Diagnosis not present

## 2021-09-09 DIAGNOSIS — J9601 Acute respiratory failure with hypoxia: Secondary | ICD-10-CM | POA: Diagnosis not present

## 2021-09-09 DIAGNOSIS — E119 Type 2 diabetes mellitus without complications: Secondary | ICD-10-CM | POA: Diagnosis not present

## 2021-09-10 DIAGNOSIS — I9589 Other hypotension: Secondary | ICD-10-CM | POA: Diagnosis not present

## 2021-09-10 DIAGNOSIS — I480 Paroxysmal atrial fibrillation: Secondary | ICD-10-CM | POA: Diagnosis not present

## 2021-09-10 DIAGNOSIS — D649 Anemia, unspecified: Secondary | ICD-10-CM | POA: Diagnosis not present

## 2021-09-10 DIAGNOSIS — N2581 Secondary hyperparathyroidism of renal origin: Secondary | ICD-10-CM | POA: Diagnosis not present

## 2021-09-10 DIAGNOSIS — J9601 Acute respiratory failure with hypoxia: Secondary | ICD-10-CM | POA: Diagnosis not present

## 2021-09-10 DIAGNOSIS — M726 Necrotizing fasciitis: Secondary | ICD-10-CM | POA: Diagnosis not present

## 2021-09-10 DIAGNOSIS — S066X0A Traumatic subarachnoid hemorrhage without loss of consciousness, initial encounter: Secondary | ICD-10-CM | POA: Diagnosis not present

## 2021-09-10 DIAGNOSIS — N179 Acute kidney failure, unspecified: Secondary | ICD-10-CM | POA: Diagnosis not present

## 2021-09-11 DIAGNOSIS — E119 Type 2 diabetes mellitus without complications: Secondary | ICD-10-CM | POA: Diagnosis not present

## 2021-09-11 DIAGNOSIS — R5381 Other malaise: Secondary | ICD-10-CM | POA: Diagnosis not present

## 2021-09-11 DIAGNOSIS — S81802A Unspecified open wound, left lower leg, initial encounter: Secondary | ICD-10-CM | POA: Diagnosis not present

## 2021-09-11 DIAGNOSIS — M6281 Muscle weakness (generalized): Secondary | ICD-10-CM | POA: Diagnosis not present

## 2021-09-11 DIAGNOSIS — S066X0A Traumatic subarachnoid hemorrhage without loss of consciousness, initial encounter: Secondary | ICD-10-CM | POA: Diagnosis not present

## 2021-09-11 DIAGNOSIS — J9601 Acute respiratory failure with hypoxia: Secondary | ICD-10-CM | POA: Diagnosis not present

## 2021-09-11 DIAGNOSIS — I9589 Other hypotension: Secondary | ICD-10-CM | POA: Diagnosis not present

## 2021-09-11 DIAGNOSIS — M726 Necrotizing fasciitis: Secondary | ICD-10-CM | POA: Diagnosis not present

## 2021-09-11 DIAGNOSIS — N2581 Secondary hyperparathyroidism of renal origin: Secondary | ICD-10-CM | POA: Diagnosis not present

## 2021-09-11 DIAGNOSIS — F1120 Opioid dependence, uncomplicated: Secondary | ICD-10-CM | POA: Diagnosis not present

## 2021-09-11 DIAGNOSIS — D649 Anemia, unspecified: Secondary | ICD-10-CM | POA: Diagnosis not present

## 2021-09-11 DIAGNOSIS — I10 Essential (primary) hypertension: Secondary | ICD-10-CM | POA: Diagnosis not present

## 2021-09-11 DIAGNOSIS — R2689 Other abnormalities of gait and mobility: Secondary | ICD-10-CM | POA: Diagnosis not present

## 2021-09-11 DIAGNOSIS — I480 Paroxysmal atrial fibrillation: Secondary | ICD-10-CM | POA: Diagnosis not present

## 2021-09-11 DIAGNOSIS — J9690 Respiratory failure, unspecified, unspecified whether with hypoxia or hypercapnia: Secondary | ICD-10-CM | POA: Diagnosis not present

## 2021-09-11 DIAGNOSIS — R4189 Other symptoms and signs involving cognitive functions and awareness: Secondary | ICD-10-CM | POA: Diagnosis not present

## 2021-09-11 DIAGNOSIS — N179 Acute kidney failure, unspecified: Secondary | ICD-10-CM | POA: Diagnosis not present

## 2021-09-12 DIAGNOSIS — M726 Necrotizing fasciitis: Secondary | ICD-10-CM | POA: Diagnosis not present

## 2021-09-12 DIAGNOSIS — I9589 Other hypotension: Secondary | ICD-10-CM | POA: Diagnosis not present

## 2021-09-12 DIAGNOSIS — J9601 Acute respiratory failure with hypoxia: Secondary | ICD-10-CM | POA: Diagnosis not present

## 2021-09-12 DIAGNOSIS — S066X0A Traumatic subarachnoid hemorrhage without loss of consciousness, initial encounter: Secondary | ICD-10-CM | POA: Diagnosis not present

## 2021-09-12 DIAGNOSIS — N179 Acute kidney failure, unspecified: Secondary | ICD-10-CM | POA: Diagnosis not present

## 2021-09-12 DIAGNOSIS — D649 Anemia, unspecified: Secondary | ICD-10-CM | POA: Diagnosis not present

## 2021-09-12 DIAGNOSIS — I480 Paroxysmal atrial fibrillation: Secondary | ICD-10-CM | POA: Diagnosis not present

## 2021-09-12 DIAGNOSIS — N2581 Secondary hyperparathyroidism of renal origin: Secondary | ICD-10-CM | POA: Diagnosis not present

## 2021-09-13 DIAGNOSIS — D649 Anemia, unspecified: Secondary | ICD-10-CM | POA: Diagnosis not present

## 2021-09-13 DIAGNOSIS — F1120 Opioid dependence, uncomplicated: Secondary | ICD-10-CM | POA: Diagnosis not present

## 2021-09-13 DIAGNOSIS — J9601 Acute respiratory failure with hypoxia: Secondary | ICD-10-CM | POA: Diagnosis not present

## 2021-09-13 DIAGNOSIS — S066X0A Traumatic subarachnoid hemorrhage without loss of consciousness, initial encounter: Secondary | ICD-10-CM | POA: Diagnosis not present

## 2021-09-13 DIAGNOSIS — I480 Paroxysmal atrial fibrillation: Secondary | ICD-10-CM | POA: Diagnosis not present

## 2021-09-13 DIAGNOSIS — R4189 Other symptoms and signs involving cognitive functions and awareness: Secondary | ICD-10-CM | POA: Diagnosis not present

## 2021-09-13 DIAGNOSIS — J9690 Respiratory failure, unspecified, unspecified whether with hypoxia or hypercapnia: Secondary | ICD-10-CM | POA: Diagnosis not present

## 2021-09-13 DIAGNOSIS — I10 Essential (primary) hypertension: Secondary | ICD-10-CM | POA: Diagnosis not present

## 2021-09-13 DIAGNOSIS — R2689 Other abnormalities of gait and mobility: Secondary | ICD-10-CM | POA: Diagnosis not present

## 2021-09-13 DIAGNOSIS — S81802A Unspecified open wound, left lower leg, initial encounter: Secondary | ICD-10-CM | POA: Diagnosis not present

## 2021-09-13 DIAGNOSIS — M726 Necrotizing fasciitis: Secondary | ICD-10-CM | POA: Diagnosis not present

## 2021-09-13 DIAGNOSIS — I9589 Other hypotension: Secondary | ICD-10-CM | POA: Diagnosis not present

## 2021-09-13 DIAGNOSIS — N2581 Secondary hyperparathyroidism of renal origin: Secondary | ICD-10-CM | POA: Diagnosis not present

## 2021-09-13 DIAGNOSIS — R5381 Other malaise: Secondary | ICD-10-CM | POA: Diagnosis not present

## 2021-09-13 DIAGNOSIS — N179 Acute kidney failure, unspecified: Secondary | ICD-10-CM | POA: Diagnosis not present

## 2021-09-13 DIAGNOSIS — M6281 Muscle weakness (generalized): Secondary | ICD-10-CM | POA: Diagnosis not present

## 2021-09-13 DIAGNOSIS — E119 Type 2 diabetes mellitus without complications: Secondary | ICD-10-CM | POA: Diagnosis not present

## 2021-09-14 DIAGNOSIS — M726 Necrotizing fasciitis: Secondary | ICD-10-CM | POA: Diagnosis not present

## 2021-09-14 DIAGNOSIS — I4891 Unspecified atrial fibrillation: Secondary | ICD-10-CM | POA: Diagnosis not present

## 2021-09-14 DIAGNOSIS — Z4659 Encounter for fitting and adjustment of other gastrointestinal appliance and device: Secondary | ICD-10-CM | POA: Diagnosis not present

## 2021-09-14 DIAGNOSIS — J189 Pneumonia, unspecified organism: Secondary | ICD-10-CM | POA: Diagnosis not present

## 2021-09-14 DIAGNOSIS — J9690 Respiratory failure, unspecified, unspecified whether with hypoxia or hypercapnia: Secondary | ICD-10-CM | POA: Diagnosis not present

## 2021-09-15 DIAGNOSIS — J189 Pneumonia, unspecified organism: Secondary | ICD-10-CM | POA: Diagnosis not present

## 2021-09-15 DIAGNOSIS — I4891 Unspecified atrial fibrillation: Secondary | ICD-10-CM | POA: Diagnosis not present

## 2021-09-15 DIAGNOSIS — M726 Necrotizing fasciitis: Secondary | ICD-10-CM | POA: Diagnosis not present

## 2021-09-16 DIAGNOSIS — E119 Type 2 diabetes mellitus without complications: Secondary | ICD-10-CM | POA: Diagnosis not present

## 2021-09-16 DIAGNOSIS — S066X0A Traumatic subarachnoid hemorrhage without loss of consciousness, initial encounter: Secondary | ICD-10-CM | POA: Diagnosis not present

## 2021-09-16 DIAGNOSIS — R2689 Other abnormalities of gait and mobility: Secondary | ICD-10-CM | POA: Diagnosis not present

## 2021-09-16 DIAGNOSIS — S81802A Unspecified open wound, left lower leg, initial encounter: Secondary | ICD-10-CM | POA: Diagnosis not present

## 2021-09-16 DIAGNOSIS — N2581 Secondary hyperparathyroidism of renal origin: Secondary | ICD-10-CM | POA: Diagnosis not present

## 2021-09-16 DIAGNOSIS — M726 Necrotizing fasciitis: Secondary | ICD-10-CM | POA: Diagnosis not present

## 2021-09-16 DIAGNOSIS — D649 Anemia, unspecified: Secondary | ICD-10-CM | POA: Diagnosis not present

## 2021-09-16 DIAGNOSIS — M6281 Muscle weakness (generalized): Secondary | ICD-10-CM | POA: Diagnosis not present

## 2021-09-16 DIAGNOSIS — J9601 Acute respiratory failure with hypoxia: Secondary | ICD-10-CM | POA: Diagnosis not present

## 2021-09-16 DIAGNOSIS — I10 Essential (primary) hypertension: Secondary | ICD-10-CM | POA: Diagnosis not present

## 2021-09-16 DIAGNOSIS — J9690 Respiratory failure, unspecified, unspecified whether with hypoxia or hypercapnia: Secondary | ICD-10-CM | POA: Diagnosis not present

## 2021-09-16 DIAGNOSIS — I480 Paroxysmal atrial fibrillation: Secondary | ICD-10-CM | POA: Diagnosis not present

## 2021-09-16 DIAGNOSIS — I9589 Other hypotension: Secondary | ICD-10-CM | POA: Diagnosis not present

## 2021-09-16 DIAGNOSIS — F1120 Opioid dependence, uncomplicated: Secondary | ICD-10-CM | POA: Diagnosis not present

## 2021-09-16 DIAGNOSIS — R4189 Other symptoms and signs involving cognitive functions and awareness: Secondary | ICD-10-CM | POA: Diagnosis not present

## 2021-09-16 DIAGNOSIS — N179 Acute kidney failure, unspecified: Secondary | ICD-10-CM | POA: Diagnosis not present

## 2021-09-16 DIAGNOSIS — R5381 Other malaise: Secondary | ICD-10-CM | POA: Diagnosis not present

## 2021-09-17 DIAGNOSIS — M726 Necrotizing fasciitis: Secondary | ICD-10-CM | POA: Diagnosis not present

## 2021-09-17 DIAGNOSIS — I9589 Other hypotension: Secondary | ICD-10-CM | POA: Diagnosis not present

## 2021-09-17 DIAGNOSIS — N179 Acute kidney failure, unspecified: Secondary | ICD-10-CM | POA: Diagnosis not present

## 2021-09-17 DIAGNOSIS — N2581 Secondary hyperparathyroidism of renal origin: Secondary | ICD-10-CM | POA: Diagnosis not present

## 2021-09-17 DIAGNOSIS — J9601 Acute respiratory failure with hypoxia: Secondary | ICD-10-CM | POA: Diagnosis not present

## 2021-09-17 DIAGNOSIS — R1311 Dysphagia, oral phase: Secondary | ICD-10-CM | POA: Diagnosis not present

## 2021-09-17 DIAGNOSIS — S066X0A Traumatic subarachnoid hemorrhage without loss of consciousness, initial encounter: Secondary | ICD-10-CM | POA: Diagnosis not present

## 2021-09-17 DIAGNOSIS — R1312 Dysphagia, oropharyngeal phase: Secondary | ICD-10-CM | POA: Diagnosis not present

## 2021-09-17 DIAGNOSIS — K9423 Gastrostomy malfunction: Secondary | ICD-10-CM | POA: Diagnosis not present

## 2021-09-17 DIAGNOSIS — D649 Anemia, unspecified: Secondary | ICD-10-CM | POA: Diagnosis not present

## 2021-09-17 DIAGNOSIS — K3184 Gastroparesis: Secondary | ICD-10-CM | POA: Diagnosis not present

## 2021-09-17 DIAGNOSIS — I480 Paroxysmal atrial fibrillation: Secondary | ICD-10-CM | POA: Diagnosis not present

## 2021-09-17 DIAGNOSIS — K2100 Gastro-esophageal reflux disease with esophagitis, without bleeding: Secondary | ICD-10-CM | POA: Diagnosis not present

## 2021-09-18 DIAGNOSIS — F1120 Opioid dependence, uncomplicated: Secondary | ICD-10-CM | POA: Diagnosis not present

## 2021-09-18 DIAGNOSIS — D649 Anemia, unspecified: Secondary | ICD-10-CM | POA: Diagnosis not present

## 2021-09-18 DIAGNOSIS — J9690 Respiratory failure, unspecified, unspecified whether with hypoxia or hypercapnia: Secondary | ICD-10-CM | POA: Diagnosis not present

## 2021-09-18 DIAGNOSIS — J9601 Acute respiratory failure with hypoxia: Secondary | ICD-10-CM | POA: Diagnosis not present

## 2021-09-18 DIAGNOSIS — E119 Type 2 diabetes mellitus without complications: Secondary | ICD-10-CM | POA: Diagnosis not present

## 2021-09-18 DIAGNOSIS — S81802A Unspecified open wound, left lower leg, initial encounter: Secondary | ICD-10-CM | POA: Diagnosis not present

## 2021-09-18 DIAGNOSIS — S066X0A Traumatic subarachnoid hemorrhage without loss of consciousness, initial encounter: Secondary | ICD-10-CM | POA: Diagnosis not present

## 2021-09-18 DIAGNOSIS — I9589 Other hypotension: Secondary | ICD-10-CM | POA: Diagnosis not present

## 2021-09-18 DIAGNOSIS — N179 Acute kidney failure, unspecified: Secondary | ICD-10-CM | POA: Diagnosis not present

## 2021-09-18 DIAGNOSIS — I480 Paroxysmal atrial fibrillation: Secondary | ICD-10-CM | POA: Diagnosis not present

## 2021-09-18 DIAGNOSIS — I10 Essential (primary) hypertension: Secondary | ICD-10-CM | POA: Diagnosis not present

## 2021-09-18 DIAGNOSIS — M726 Necrotizing fasciitis: Secondary | ICD-10-CM | POA: Diagnosis not present

## 2021-09-18 DIAGNOSIS — N2581 Secondary hyperparathyroidism of renal origin: Secondary | ICD-10-CM | POA: Diagnosis not present

## 2021-09-19 DIAGNOSIS — N179 Acute kidney failure, unspecified: Secondary | ICD-10-CM | POA: Diagnosis not present

## 2021-09-19 DIAGNOSIS — S066X0A Traumatic subarachnoid hemorrhage without loss of consciousness, initial encounter: Secondary | ICD-10-CM | POA: Diagnosis not present

## 2021-09-19 DIAGNOSIS — N2581 Secondary hyperparathyroidism of renal origin: Secondary | ICD-10-CM | POA: Diagnosis not present

## 2021-09-19 DIAGNOSIS — R4189 Other symptoms and signs involving cognitive functions and awareness: Secondary | ICD-10-CM | POA: Diagnosis not present

## 2021-09-19 DIAGNOSIS — I9589 Other hypotension: Secondary | ICD-10-CM | POA: Diagnosis not present

## 2021-09-19 DIAGNOSIS — R5381 Other malaise: Secondary | ICD-10-CM | POA: Diagnosis not present

## 2021-09-19 DIAGNOSIS — R2689 Other abnormalities of gait and mobility: Secondary | ICD-10-CM | POA: Diagnosis not present

## 2021-09-19 DIAGNOSIS — M726 Necrotizing fasciitis: Secondary | ICD-10-CM | POA: Diagnosis not present

## 2021-09-19 DIAGNOSIS — J9601 Acute respiratory failure with hypoxia: Secondary | ICD-10-CM | POA: Diagnosis not present

## 2021-09-19 DIAGNOSIS — M6281 Muscle weakness (generalized): Secondary | ICD-10-CM | POA: Diagnosis not present

## 2021-09-19 DIAGNOSIS — I480 Paroxysmal atrial fibrillation: Secondary | ICD-10-CM | POA: Diagnosis not present

## 2021-09-19 DIAGNOSIS — D649 Anemia, unspecified: Secondary | ICD-10-CM | POA: Diagnosis not present

## 2021-09-20 DIAGNOSIS — I480 Paroxysmal atrial fibrillation: Secondary | ICD-10-CM | POA: Diagnosis not present

## 2021-09-20 DIAGNOSIS — I9589 Other hypotension: Secondary | ICD-10-CM | POA: Diagnosis not present

## 2021-09-20 DIAGNOSIS — M726 Necrotizing fasciitis: Secondary | ICD-10-CM | POA: Diagnosis not present

## 2021-09-20 DIAGNOSIS — N2581 Secondary hyperparathyroidism of renal origin: Secondary | ICD-10-CM | POA: Diagnosis not present

## 2021-09-20 DIAGNOSIS — N179 Acute kidney failure, unspecified: Secondary | ICD-10-CM | POA: Diagnosis not present

## 2021-09-20 DIAGNOSIS — J9601 Acute respiratory failure with hypoxia: Secondary | ICD-10-CM | POA: Diagnosis not present

## 2021-09-20 DIAGNOSIS — S066X0A Traumatic subarachnoid hemorrhage without loss of consciousness, initial encounter: Secondary | ICD-10-CM | POA: Diagnosis not present

## 2021-09-20 DIAGNOSIS — J9690 Respiratory failure, unspecified, unspecified whether with hypoxia or hypercapnia: Secondary | ICD-10-CM | POA: Diagnosis not present

## 2021-09-20 DIAGNOSIS — S81802A Unspecified open wound, left lower leg, initial encounter: Secondary | ICD-10-CM | POA: Diagnosis not present

## 2021-09-20 DIAGNOSIS — E119 Type 2 diabetes mellitus without complications: Secondary | ICD-10-CM | POA: Diagnosis not present

## 2021-09-20 DIAGNOSIS — F1120 Opioid dependence, uncomplicated: Secondary | ICD-10-CM | POA: Diagnosis not present

## 2021-09-20 DIAGNOSIS — D649 Anemia, unspecified: Secondary | ICD-10-CM | POA: Diagnosis not present

## 2021-09-20 DIAGNOSIS — I10 Essential (primary) hypertension: Secondary | ICD-10-CM | POA: Diagnosis not present

## 2021-09-21 DIAGNOSIS — G9341 Metabolic encephalopathy: Secondary | ICD-10-CM | POA: Diagnosis not present

## 2021-09-21 DIAGNOSIS — J9690 Respiratory failure, unspecified, unspecified whether with hypoxia or hypercapnia: Secondary | ICD-10-CM | POA: Diagnosis not present

## 2021-09-21 DIAGNOSIS — I480 Paroxysmal atrial fibrillation: Secondary | ICD-10-CM | POA: Diagnosis not present

## 2021-09-21 DIAGNOSIS — R109 Unspecified abdominal pain: Secondary | ICD-10-CM | POA: Diagnosis not present

## 2021-09-21 DIAGNOSIS — M726 Necrotizing fasciitis: Secondary | ICD-10-CM | POA: Diagnosis not present

## 2021-09-22 DIAGNOSIS — I480 Paroxysmal atrial fibrillation: Secondary | ICD-10-CM | POA: Diagnosis not present

## 2021-09-22 DIAGNOSIS — G9341 Metabolic encephalopathy: Secondary | ICD-10-CM | POA: Diagnosis not present

## 2021-09-22 DIAGNOSIS — M726 Necrotizing fasciitis: Secondary | ICD-10-CM | POA: Diagnosis not present

## 2021-09-23 DIAGNOSIS — R4189 Other symptoms and signs involving cognitive functions and awareness: Secondary | ICD-10-CM | POA: Diagnosis not present

## 2021-09-23 DIAGNOSIS — E119 Type 2 diabetes mellitus without complications: Secondary | ICD-10-CM | POA: Diagnosis not present

## 2021-09-23 DIAGNOSIS — I10 Essential (primary) hypertension: Secondary | ICD-10-CM | POA: Diagnosis not present

## 2021-09-23 DIAGNOSIS — R5381 Other malaise: Secondary | ICD-10-CM | POA: Diagnosis not present

## 2021-09-23 DIAGNOSIS — I9589 Other hypotension: Secondary | ICD-10-CM | POA: Diagnosis not present

## 2021-09-23 DIAGNOSIS — S81802A Unspecified open wound, left lower leg, initial encounter: Secondary | ICD-10-CM | POA: Diagnosis not present

## 2021-09-23 DIAGNOSIS — M726 Necrotizing fasciitis: Secondary | ICD-10-CM | POA: Diagnosis not present

## 2021-09-23 DIAGNOSIS — I480 Paroxysmal atrial fibrillation: Secondary | ICD-10-CM | POA: Diagnosis not present

## 2021-09-23 DIAGNOSIS — D649 Anemia, unspecified: Secondary | ICD-10-CM | POA: Diagnosis not present

## 2021-09-23 DIAGNOSIS — J9601 Acute respiratory failure with hypoxia: Secondary | ICD-10-CM | POA: Diagnosis not present

## 2021-09-23 DIAGNOSIS — N2581 Secondary hyperparathyroidism of renal origin: Secondary | ICD-10-CM | POA: Diagnosis not present

## 2021-09-23 DIAGNOSIS — R2689 Other abnormalities of gait and mobility: Secondary | ICD-10-CM | POA: Diagnosis not present

## 2021-09-23 DIAGNOSIS — M6281 Muscle weakness (generalized): Secondary | ICD-10-CM | POA: Diagnosis not present

## 2021-09-23 DIAGNOSIS — F1120 Opioid dependence, uncomplicated: Secondary | ICD-10-CM | POA: Diagnosis not present

## 2021-09-23 DIAGNOSIS — J9690 Respiratory failure, unspecified, unspecified whether with hypoxia or hypercapnia: Secondary | ICD-10-CM | POA: Diagnosis not present

## 2021-09-23 DIAGNOSIS — N179 Acute kidney failure, unspecified: Secondary | ICD-10-CM | POA: Diagnosis not present

## 2021-09-23 DIAGNOSIS — S066X0A Traumatic subarachnoid hemorrhage without loss of consciousness, initial encounter: Secondary | ICD-10-CM | POA: Diagnosis not present

## 2021-09-24 DIAGNOSIS — I9589 Other hypotension: Secondary | ICD-10-CM | POA: Diagnosis not present

## 2021-09-24 DIAGNOSIS — I480 Paroxysmal atrial fibrillation: Secondary | ICD-10-CM | POA: Diagnosis not present

## 2021-09-24 DIAGNOSIS — D649 Anemia, unspecified: Secondary | ICD-10-CM | POA: Diagnosis not present

## 2021-09-24 DIAGNOSIS — R1312 Dysphagia, oropharyngeal phase: Secondary | ICD-10-CM | POA: Diagnosis not present

## 2021-09-24 DIAGNOSIS — S066X0A Traumatic subarachnoid hemorrhage without loss of consciousness, initial encounter: Secondary | ICD-10-CM | POA: Diagnosis not present

## 2021-09-24 DIAGNOSIS — K9423 Gastrostomy malfunction: Secondary | ICD-10-CM | POA: Diagnosis not present

## 2021-09-24 DIAGNOSIS — N179 Acute kidney failure, unspecified: Secondary | ICD-10-CM | POA: Diagnosis not present

## 2021-09-24 DIAGNOSIS — K2100 Gastro-esophageal reflux disease with esophagitis, without bleeding: Secondary | ICD-10-CM | POA: Diagnosis not present

## 2021-09-24 DIAGNOSIS — K3184 Gastroparesis: Secondary | ICD-10-CM | POA: Diagnosis not present

## 2021-09-24 DIAGNOSIS — R1311 Dysphagia, oral phase: Secondary | ICD-10-CM | POA: Diagnosis not present

## 2021-09-24 DIAGNOSIS — J9601 Acute respiratory failure with hypoxia: Secondary | ICD-10-CM | POA: Diagnosis not present

## 2021-09-24 DIAGNOSIS — M726 Necrotizing fasciitis: Secondary | ICD-10-CM | POA: Diagnosis not present

## 2021-09-24 DIAGNOSIS — N2581 Secondary hyperparathyroidism of renal origin: Secondary | ICD-10-CM | POA: Diagnosis not present

## 2021-09-25 ENCOUNTER — Other Ambulatory Visit: Payer: Self-pay | Admitting: Family Medicine

## 2021-09-25 DIAGNOSIS — J9601 Acute respiratory failure with hypoxia: Secondary | ICD-10-CM | POA: Diagnosis not present

## 2021-09-25 DIAGNOSIS — N179 Acute kidney failure, unspecified: Secondary | ICD-10-CM | POA: Diagnosis not present

## 2021-09-25 DIAGNOSIS — E119 Type 2 diabetes mellitus without complications: Secondary | ICD-10-CM | POA: Diagnosis not present

## 2021-09-25 DIAGNOSIS — I480 Paroxysmal atrial fibrillation: Secondary | ICD-10-CM | POA: Diagnosis not present

## 2021-09-25 DIAGNOSIS — N2581 Secondary hyperparathyroidism of renal origin: Secondary | ICD-10-CM | POA: Diagnosis not present

## 2021-09-25 DIAGNOSIS — I9589 Other hypotension: Secondary | ICD-10-CM | POA: Diagnosis not present

## 2021-09-25 DIAGNOSIS — M726 Necrotizing fasciitis: Secondary | ICD-10-CM | POA: Diagnosis not present

## 2021-09-25 DIAGNOSIS — D649 Anemia, unspecified: Secondary | ICD-10-CM | POA: Diagnosis not present

## 2021-09-25 DIAGNOSIS — J9690 Respiratory failure, unspecified, unspecified whether with hypoxia or hypercapnia: Secondary | ICD-10-CM | POA: Diagnosis not present

## 2021-09-25 DIAGNOSIS — F1120 Opioid dependence, uncomplicated: Secondary | ICD-10-CM | POA: Diagnosis not present

## 2021-09-25 DIAGNOSIS — S81802A Unspecified open wound, left lower leg, initial encounter: Secondary | ICD-10-CM | POA: Diagnosis not present

## 2021-09-25 DIAGNOSIS — I10 Essential (primary) hypertension: Secondary | ICD-10-CM | POA: Diagnosis not present

## 2021-09-25 DIAGNOSIS — S066X0A Traumatic subarachnoid hemorrhage without loss of consciousness, initial encounter: Secondary | ICD-10-CM | POA: Diagnosis not present

## 2021-09-26 DIAGNOSIS — I480 Paroxysmal atrial fibrillation: Secondary | ICD-10-CM | POA: Diagnosis not present

## 2021-09-26 DIAGNOSIS — N179 Acute kidney failure, unspecified: Secondary | ICD-10-CM | POA: Diagnosis not present

## 2021-09-26 DIAGNOSIS — J9601 Acute respiratory failure with hypoxia: Secondary | ICD-10-CM | POA: Diagnosis not present

## 2021-09-26 DIAGNOSIS — D649 Anemia, unspecified: Secondary | ICD-10-CM | POA: Diagnosis not present

## 2021-09-26 DIAGNOSIS — S066X0A Traumatic subarachnoid hemorrhage without loss of consciousness, initial encounter: Secondary | ICD-10-CM | POA: Diagnosis not present

## 2021-09-26 DIAGNOSIS — M726 Necrotizing fasciitis: Secondary | ICD-10-CM | POA: Diagnosis not present

## 2021-09-26 DIAGNOSIS — I9589 Other hypotension: Secondary | ICD-10-CM | POA: Diagnosis not present

## 2021-09-26 DIAGNOSIS — N2581 Secondary hyperparathyroidism of renal origin: Secondary | ICD-10-CM | POA: Diagnosis not present

## 2021-09-27 DIAGNOSIS — N179 Acute kidney failure, unspecified: Secondary | ICD-10-CM | POA: Diagnosis not present

## 2021-09-27 DIAGNOSIS — S81802A Unspecified open wound, left lower leg, initial encounter: Secondary | ICD-10-CM | POA: Diagnosis not present

## 2021-09-27 DIAGNOSIS — J9601 Acute respiratory failure with hypoxia: Secondary | ICD-10-CM | POA: Diagnosis not present

## 2021-09-27 DIAGNOSIS — N2581 Secondary hyperparathyroidism of renal origin: Secondary | ICD-10-CM | POA: Diagnosis not present

## 2021-09-27 DIAGNOSIS — E119 Type 2 diabetes mellitus without complications: Secondary | ICD-10-CM | POA: Diagnosis not present

## 2021-09-27 DIAGNOSIS — F1120 Opioid dependence, uncomplicated: Secondary | ICD-10-CM | POA: Diagnosis not present

## 2021-09-27 DIAGNOSIS — I10 Essential (primary) hypertension: Secondary | ICD-10-CM | POA: Diagnosis not present

## 2021-09-27 DIAGNOSIS — S066X0A Traumatic subarachnoid hemorrhage without loss of consciousness, initial encounter: Secondary | ICD-10-CM | POA: Diagnosis not present

## 2021-09-27 DIAGNOSIS — I480 Paroxysmal atrial fibrillation: Secondary | ICD-10-CM | POA: Diagnosis not present

## 2021-09-27 DIAGNOSIS — I9589 Other hypotension: Secondary | ICD-10-CM | POA: Diagnosis not present

## 2021-09-27 DIAGNOSIS — D649 Anemia, unspecified: Secondary | ICD-10-CM | POA: Diagnosis not present

## 2021-09-27 DIAGNOSIS — J9690 Respiratory failure, unspecified, unspecified whether with hypoxia or hypercapnia: Secondary | ICD-10-CM | POA: Diagnosis not present

## 2021-09-27 DIAGNOSIS — M726 Necrotizing fasciitis: Secondary | ICD-10-CM | POA: Diagnosis not present

## 2021-09-28 DIAGNOSIS — N179 Acute kidney failure, unspecified: Secondary | ICD-10-CM | POA: Diagnosis not present

## 2021-09-28 DIAGNOSIS — M726 Necrotizing fasciitis: Secondary | ICD-10-CM | POA: Diagnosis not present

## 2021-09-28 DIAGNOSIS — J9601 Acute respiratory failure with hypoxia: Secondary | ICD-10-CM | POA: Diagnosis not present

## 2021-09-28 DIAGNOSIS — S066X0A Traumatic subarachnoid hemorrhage without loss of consciousness, initial encounter: Secondary | ICD-10-CM | POA: Diagnosis not present

## 2021-09-29 DIAGNOSIS — M726 Necrotizing fasciitis: Secondary | ICD-10-CM | POA: Diagnosis not present

## 2021-09-29 DIAGNOSIS — S066X0A Traumatic subarachnoid hemorrhage without loss of consciousness, initial encounter: Secondary | ICD-10-CM | POA: Diagnosis not present

## 2021-09-29 DIAGNOSIS — J9601 Acute respiratory failure with hypoxia: Secondary | ICD-10-CM | POA: Diagnosis not present

## 2021-09-29 DIAGNOSIS — N179 Acute kidney failure, unspecified: Secondary | ICD-10-CM | POA: Diagnosis not present

## 2021-09-30 DIAGNOSIS — N2581 Secondary hyperparathyroidism of renal origin: Secondary | ICD-10-CM | POA: Diagnosis not present

## 2021-09-30 DIAGNOSIS — I480 Paroxysmal atrial fibrillation: Secondary | ICD-10-CM | POA: Diagnosis not present

## 2021-09-30 DIAGNOSIS — E119 Type 2 diabetes mellitus without complications: Secondary | ICD-10-CM | POA: Diagnosis not present

## 2021-09-30 DIAGNOSIS — D649 Anemia, unspecified: Secondary | ICD-10-CM | POA: Diagnosis not present

## 2021-09-30 DIAGNOSIS — R5381 Other malaise: Secondary | ICD-10-CM | POA: Diagnosis not present

## 2021-09-30 DIAGNOSIS — N179 Acute kidney failure, unspecified: Secondary | ICD-10-CM | POA: Diagnosis not present

## 2021-09-30 DIAGNOSIS — J9601 Acute respiratory failure with hypoxia: Secondary | ICD-10-CM | POA: Diagnosis not present

## 2021-09-30 DIAGNOSIS — S81802A Unspecified open wound, left lower leg, initial encounter: Secondary | ICD-10-CM | POA: Diagnosis not present

## 2021-09-30 DIAGNOSIS — F1120 Opioid dependence, uncomplicated: Secondary | ICD-10-CM | POA: Diagnosis not present

## 2021-09-30 DIAGNOSIS — M6281 Muscle weakness (generalized): Secondary | ICD-10-CM | POA: Diagnosis not present

## 2021-09-30 DIAGNOSIS — J9690 Respiratory failure, unspecified, unspecified whether with hypoxia or hypercapnia: Secondary | ICD-10-CM | POA: Diagnosis not present

## 2021-09-30 DIAGNOSIS — I10 Essential (primary) hypertension: Secondary | ICD-10-CM | POA: Diagnosis not present

## 2021-09-30 DIAGNOSIS — R2689 Other abnormalities of gait and mobility: Secondary | ICD-10-CM | POA: Diagnosis not present

## 2021-09-30 DIAGNOSIS — I9589 Other hypotension: Secondary | ICD-10-CM | POA: Diagnosis not present

## 2021-09-30 DIAGNOSIS — M726 Necrotizing fasciitis: Secondary | ICD-10-CM | POA: Diagnosis not present

## 2021-09-30 DIAGNOSIS — R4189 Other symptoms and signs involving cognitive functions and awareness: Secondary | ICD-10-CM | POA: Diagnosis not present

## 2021-09-30 DIAGNOSIS — S066X0A Traumatic subarachnoid hemorrhage without loss of consciousness, initial encounter: Secondary | ICD-10-CM | POA: Diagnosis not present

## 2021-10-01 DIAGNOSIS — N179 Acute kidney failure, unspecified: Secondary | ICD-10-CM | POA: Diagnosis not present

## 2021-10-01 DIAGNOSIS — N2581 Secondary hyperparathyroidism of renal origin: Secondary | ICD-10-CM | POA: Diagnosis not present

## 2021-10-01 DIAGNOSIS — J9601 Acute respiratory failure with hypoxia: Secondary | ICD-10-CM | POA: Diagnosis not present

## 2021-10-01 DIAGNOSIS — I480 Paroxysmal atrial fibrillation: Secondary | ICD-10-CM | POA: Diagnosis not present

## 2021-10-01 DIAGNOSIS — M726 Necrotizing fasciitis: Secondary | ICD-10-CM | POA: Diagnosis not present

## 2021-10-01 DIAGNOSIS — R1319 Other dysphagia: Secondary | ICD-10-CM | POA: Diagnosis not present

## 2021-10-01 DIAGNOSIS — K922 Gastrointestinal hemorrhage, unspecified: Secondary | ICD-10-CM | POA: Diagnosis not present

## 2021-10-01 DIAGNOSIS — D62 Acute posthemorrhagic anemia: Secondary | ICD-10-CM | POA: Diagnosis not present

## 2021-10-01 DIAGNOSIS — D649 Anemia, unspecified: Secondary | ICD-10-CM | POA: Diagnosis not present

## 2021-10-01 DIAGNOSIS — K3184 Gastroparesis: Secondary | ICD-10-CM | POA: Diagnosis not present

## 2021-10-01 DIAGNOSIS — I9589 Other hypotension: Secondary | ICD-10-CM | POA: Diagnosis not present

## 2021-10-01 DIAGNOSIS — S066X0A Traumatic subarachnoid hemorrhage without loss of consciousness, initial encounter: Secondary | ICD-10-CM | POA: Diagnosis not present

## 2021-10-01 DIAGNOSIS — K9423 Gastrostomy malfunction: Secondary | ICD-10-CM | POA: Diagnosis not present

## 2021-10-01 DIAGNOSIS — R1311 Dysphagia, oral phase: Secondary | ICD-10-CM | POA: Diagnosis not present

## 2021-10-02 DIAGNOSIS — D649 Anemia, unspecified: Secondary | ICD-10-CM | POA: Diagnosis not present

## 2021-10-02 DIAGNOSIS — J9601 Acute respiratory failure with hypoxia: Secondary | ICD-10-CM | POA: Diagnosis not present

## 2021-10-02 DIAGNOSIS — N2581 Secondary hyperparathyroidism of renal origin: Secondary | ICD-10-CM | POA: Diagnosis not present

## 2021-10-02 DIAGNOSIS — I9589 Other hypotension: Secondary | ICD-10-CM | POA: Diagnosis not present

## 2021-10-02 DIAGNOSIS — M726 Necrotizing fasciitis: Secondary | ICD-10-CM | POA: Diagnosis not present

## 2021-10-02 DIAGNOSIS — J9690 Respiratory failure, unspecified, unspecified whether with hypoxia or hypercapnia: Secondary | ICD-10-CM | POA: Diagnosis not present

## 2021-10-02 DIAGNOSIS — S81802A Unspecified open wound, left lower leg, initial encounter: Secondary | ICD-10-CM | POA: Diagnosis not present

## 2021-10-02 DIAGNOSIS — F1120 Opioid dependence, uncomplicated: Secondary | ICD-10-CM | POA: Diagnosis not present

## 2021-10-02 DIAGNOSIS — E119 Type 2 diabetes mellitus without complications: Secondary | ICD-10-CM | POA: Diagnosis not present

## 2021-10-02 DIAGNOSIS — I10 Essential (primary) hypertension: Secondary | ICD-10-CM | POA: Diagnosis not present

## 2021-10-02 DIAGNOSIS — N179 Acute kidney failure, unspecified: Secondary | ICD-10-CM | POA: Diagnosis not present

## 2021-10-02 DIAGNOSIS — S066X0A Traumatic subarachnoid hemorrhage without loss of consciousness, initial encounter: Secondary | ICD-10-CM | POA: Diagnosis not present

## 2021-10-02 DIAGNOSIS — I480 Paroxysmal atrial fibrillation: Secondary | ICD-10-CM | POA: Diagnosis not present

## 2021-10-03 DIAGNOSIS — I480 Paroxysmal atrial fibrillation: Secondary | ICD-10-CM | POA: Diagnosis not present

## 2021-10-03 DIAGNOSIS — R5381 Other malaise: Secondary | ICD-10-CM | POA: Diagnosis not present

## 2021-10-03 DIAGNOSIS — N2581 Secondary hyperparathyroidism of renal origin: Secondary | ICD-10-CM | POA: Diagnosis not present

## 2021-10-03 DIAGNOSIS — D649 Anemia, unspecified: Secondary | ICD-10-CM | POA: Diagnosis not present

## 2021-10-03 DIAGNOSIS — J9601 Acute respiratory failure with hypoxia: Secondary | ICD-10-CM | POA: Diagnosis not present

## 2021-10-03 DIAGNOSIS — R2689 Other abnormalities of gait and mobility: Secondary | ICD-10-CM | POA: Diagnosis not present

## 2021-10-03 DIAGNOSIS — R4189 Other symptoms and signs involving cognitive functions and awareness: Secondary | ICD-10-CM | POA: Diagnosis not present

## 2021-10-03 DIAGNOSIS — M6281 Muscle weakness (generalized): Secondary | ICD-10-CM | POA: Diagnosis not present

## 2021-10-03 DIAGNOSIS — I9589 Other hypotension: Secondary | ICD-10-CM | POA: Diagnosis not present

## 2021-10-03 DIAGNOSIS — N179 Acute kidney failure, unspecified: Secondary | ICD-10-CM | POA: Diagnosis not present

## 2021-10-03 DIAGNOSIS — S066X0A Traumatic subarachnoid hemorrhage without loss of consciousness, initial encounter: Secondary | ICD-10-CM | POA: Diagnosis not present

## 2021-10-03 DIAGNOSIS — M726 Necrotizing fasciitis: Secondary | ICD-10-CM | POA: Diagnosis not present

## 2021-10-04 DIAGNOSIS — J9690 Respiratory failure, unspecified, unspecified whether with hypoxia or hypercapnia: Secondary | ICD-10-CM | POA: Diagnosis not present

## 2021-10-04 DIAGNOSIS — G7281 Critical illness myopathy: Secondary | ICD-10-CM | POA: Diagnosis not present

## 2021-10-04 DIAGNOSIS — I9589 Other hypotension: Secondary | ICD-10-CM | POA: Diagnosis not present

## 2021-10-04 DIAGNOSIS — D649 Anemia, unspecified: Secondary | ICD-10-CM | POA: Diagnosis not present

## 2021-10-04 DIAGNOSIS — I480 Paroxysmal atrial fibrillation: Secondary | ICD-10-CM | POA: Diagnosis not present

## 2021-10-04 DIAGNOSIS — R1312 Dysphagia, oropharyngeal phase: Secondary | ICD-10-CM | POA: Diagnosis not present

## 2021-10-04 DIAGNOSIS — F1120 Opioid dependence, uncomplicated: Secondary | ICD-10-CM | POA: Diagnosis not present

## 2021-10-04 DIAGNOSIS — E119 Type 2 diabetes mellitus without complications: Secondary | ICD-10-CM | POA: Diagnosis not present

## 2021-10-04 DIAGNOSIS — G9341 Metabolic encephalopathy: Secondary | ICD-10-CM | POA: Diagnosis not present

## 2021-10-04 DIAGNOSIS — N2581 Secondary hyperparathyroidism of renal origin: Secondary | ICD-10-CM | POA: Diagnosis not present

## 2021-10-04 DIAGNOSIS — L89153 Pressure ulcer of sacral region, stage 3: Secondary | ICD-10-CM | POA: Diagnosis not present

## 2021-10-04 DIAGNOSIS — S81802A Unspecified open wound, left lower leg, initial encounter: Secondary | ICD-10-CM | POA: Diagnosis not present

## 2021-10-04 DIAGNOSIS — M726 Necrotizing fasciitis: Secondary | ICD-10-CM | POA: Diagnosis not present

## 2021-10-04 DIAGNOSIS — I10 Essential (primary) hypertension: Secondary | ICD-10-CM | POA: Diagnosis not present

## 2021-10-04 DIAGNOSIS — N179 Acute kidney failure, unspecified: Secondary | ICD-10-CM | POA: Diagnosis not present

## 2021-10-05 DIAGNOSIS — G0490 Encephalitis and encephalomyelitis, unspecified: Secondary | ICD-10-CM | POA: Diagnosis not present

## 2021-10-05 DIAGNOSIS — R0902 Hypoxemia: Secondary | ICD-10-CM | POA: Diagnosis not present

## 2021-10-05 DIAGNOSIS — I4891 Unspecified atrial fibrillation: Secondary | ICD-10-CM | POA: Diagnosis not present

## 2021-10-05 DIAGNOSIS — N186 End stage renal disease: Secondary | ICD-10-CM | POA: Diagnosis not present

## 2021-10-06 DIAGNOSIS — R0902 Hypoxemia: Secondary | ICD-10-CM | POA: Diagnosis not present

## 2021-10-06 DIAGNOSIS — G0490 Encephalitis and encephalomyelitis, unspecified: Secondary | ICD-10-CM | POA: Diagnosis not present

## 2021-10-06 DIAGNOSIS — N186 End stage renal disease: Secondary | ICD-10-CM | POA: Diagnosis not present

## 2021-10-06 DIAGNOSIS — I4891 Unspecified atrial fibrillation: Secondary | ICD-10-CM | POA: Diagnosis not present

## 2021-10-07 DIAGNOSIS — S066X0A Traumatic subarachnoid hemorrhage without loss of consciousness, initial encounter: Secondary | ICD-10-CM | POA: Diagnosis not present

## 2021-10-07 DIAGNOSIS — N179 Acute kidney failure, unspecified: Secondary | ICD-10-CM | POA: Diagnosis not present

## 2021-10-07 DIAGNOSIS — J9601 Acute respiratory failure with hypoxia: Secondary | ICD-10-CM | POA: Diagnosis not present

## 2021-10-07 DIAGNOSIS — F1120 Opioid dependence, uncomplicated: Secondary | ICD-10-CM | POA: Diagnosis not present

## 2021-10-07 DIAGNOSIS — E119 Type 2 diabetes mellitus without complications: Secondary | ICD-10-CM | POA: Diagnosis not present

## 2021-10-07 DIAGNOSIS — I9589 Other hypotension: Secondary | ICD-10-CM | POA: Diagnosis not present

## 2021-10-07 DIAGNOSIS — N2581 Secondary hyperparathyroidism of renal origin: Secondary | ICD-10-CM | POA: Diagnosis not present

## 2021-10-07 DIAGNOSIS — M726 Necrotizing fasciitis: Secondary | ICD-10-CM | POA: Diagnosis not present

## 2021-10-07 DIAGNOSIS — D649 Anemia, unspecified: Secondary | ICD-10-CM | POA: Diagnosis not present

## 2021-10-07 DIAGNOSIS — J9690 Respiratory failure, unspecified, unspecified whether with hypoxia or hypercapnia: Secondary | ICD-10-CM | POA: Diagnosis not present

## 2021-10-07 DIAGNOSIS — S81802A Unspecified open wound, left lower leg, initial encounter: Secondary | ICD-10-CM | POA: Diagnosis not present

## 2021-10-07 DIAGNOSIS — I10 Essential (primary) hypertension: Secondary | ICD-10-CM | POA: Diagnosis not present

## 2021-10-07 DIAGNOSIS — I480 Paroxysmal atrial fibrillation: Secondary | ICD-10-CM | POA: Diagnosis not present

## 2021-10-08 DIAGNOSIS — I9589 Other hypotension: Secondary | ICD-10-CM | POA: Diagnosis not present

## 2021-10-08 DIAGNOSIS — N2581 Secondary hyperparathyroidism of renal origin: Secondary | ICD-10-CM | POA: Diagnosis not present

## 2021-10-08 DIAGNOSIS — N179 Acute kidney failure, unspecified: Secondary | ICD-10-CM | POA: Diagnosis not present

## 2021-10-08 DIAGNOSIS — D649 Anemia, unspecified: Secondary | ICD-10-CM | POA: Diagnosis not present

## 2021-10-08 DIAGNOSIS — S066X0A Traumatic subarachnoid hemorrhage without loss of consciousness, initial encounter: Secondary | ICD-10-CM | POA: Diagnosis not present

## 2021-10-08 DIAGNOSIS — I480 Paroxysmal atrial fibrillation: Secondary | ICD-10-CM | POA: Diagnosis not present

## 2021-10-08 DIAGNOSIS — J9601 Acute respiratory failure with hypoxia: Secondary | ICD-10-CM | POA: Diagnosis not present

## 2021-10-08 DIAGNOSIS — M726 Necrotizing fasciitis: Secondary | ICD-10-CM | POA: Diagnosis not present

## 2021-10-09 DIAGNOSIS — E119 Type 2 diabetes mellitus without complications: Secondary | ICD-10-CM | POA: Diagnosis not present

## 2021-10-09 DIAGNOSIS — G9341 Metabolic encephalopathy: Secondary | ICD-10-CM | POA: Diagnosis not present

## 2021-10-09 DIAGNOSIS — N2581 Secondary hyperparathyroidism of renal origin: Secondary | ICD-10-CM | POA: Diagnosis not present

## 2021-10-09 DIAGNOSIS — R1312 Dysphagia, oropharyngeal phase: Secondary | ICD-10-CM | POA: Diagnosis not present

## 2021-10-09 DIAGNOSIS — M726 Necrotizing fasciitis: Secondary | ICD-10-CM | POA: Diagnosis not present

## 2021-10-09 DIAGNOSIS — N179 Acute kidney failure, unspecified: Secondary | ICD-10-CM | POA: Diagnosis not present

## 2021-10-09 DIAGNOSIS — L89153 Pressure ulcer of sacral region, stage 3: Secondary | ICD-10-CM | POA: Diagnosis not present

## 2021-10-09 DIAGNOSIS — I480 Paroxysmal atrial fibrillation: Secondary | ICD-10-CM | POA: Diagnosis not present

## 2021-10-09 DIAGNOSIS — J9690 Respiratory failure, unspecified, unspecified whether with hypoxia or hypercapnia: Secondary | ICD-10-CM | POA: Diagnosis not present

## 2021-10-09 DIAGNOSIS — F1120 Opioid dependence, uncomplicated: Secondary | ICD-10-CM | POA: Diagnosis not present

## 2021-10-09 DIAGNOSIS — I9589 Other hypotension: Secondary | ICD-10-CM | POA: Diagnosis not present

## 2021-10-09 DIAGNOSIS — I10 Essential (primary) hypertension: Secondary | ICD-10-CM | POA: Diagnosis not present

## 2021-10-09 DIAGNOSIS — G7281 Critical illness myopathy: Secondary | ICD-10-CM | POA: Diagnosis not present

## 2021-10-09 DIAGNOSIS — S81802A Unspecified open wound, left lower leg, initial encounter: Secondary | ICD-10-CM | POA: Diagnosis not present

## 2021-10-09 DIAGNOSIS — D649 Anemia, unspecified: Secondary | ICD-10-CM | POA: Diagnosis not present

## 2021-10-10 DIAGNOSIS — M726 Necrotizing fasciitis: Secondary | ICD-10-CM | POA: Diagnosis not present

## 2021-10-10 DIAGNOSIS — I9589 Other hypotension: Secondary | ICD-10-CM | POA: Diagnosis not present

## 2021-10-10 DIAGNOSIS — J9601 Acute respiratory failure with hypoxia: Secondary | ICD-10-CM | POA: Diagnosis not present

## 2021-10-10 DIAGNOSIS — N179 Acute kidney failure, unspecified: Secondary | ICD-10-CM | POA: Diagnosis not present

## 2021-10-10 DIAGNOSIS — I480 Paroxysmal atrial fibrillation: Secondary | ICD-10-CM | POA: Diagnosis not present

## 2021-10-10 DIAGNOSIS — N2581 Secondary hyperparathyroidism of renal origin: Secondary | ICD-10-CM | POA: Diagnosis not present

## 2021-10-10 DIAGNOSIS — D649 Anemia, unspecified: Secondary | ICD-10-CM | POA: Diagnosis not present

## 2021-10-10 DIAGNOSIS — S066X0A Traumatic subarachnoid hemorrhage without loss of consciousness, initial encounter: Secondary | ICD-10-CM | POA: Diagnosis not present

## 2021-10-11 DIAGNOSIS — M726 Necrotizing fasciitis: Secondary | ICD-10-CM | POA: Diagnosis not present

## 2021-10-11 DIAGNOSIS — J9690 Respiratory failure, unspecified, unspecified whether with hypoxia or hypercapnia: Secondary | ICD-10-CM | POA: Diagnosis not present

## 2021-10-11 DIAGNOSIS — F1120 Opioid dependence, uncomplicated: Secondary | ICD-10-CM | POA: Diagnosis not present

## 2021-10-11 DIAGNOSIS — I480 Paroxysmal atrial fibrillation: Secondary | ICD-10-CM | POA: Diagnosis not present

## 2021-10-11 DIAGNOSIS — I9589 Other hypotension: Secondary | ICD-10-CM | POA: Diagnosis not present

## 2021-10-11 DIAGNOSIS — S066X0A Traumatic subarachnoid hemorrhage without loss of consciousness, initial encounter: Secondary | ICD-10-CM | POA: Diagnosis not present

## 2021-10-11 DIAGNOSIS — N2581 Secondary hyperparathyroidism of renal origin: Secondary | ICD-10-CM | POA: Diagnosis not present

## 2021-10-11 DIAGNOSIS — E119 Type 2 diabetes mellitus without complications: Secondary | ICD-10-CM | POA: Diagnosis not present

## 2021-10-11 DIAGNOSIS — S81802A Unspecified open wound, left lower leg, initial encounter: Secondary | ICD-10-CM | POA: Diagnosis not present

## 2021-10-11 DIAGNOSIS — J9601 Acute respiratory failure with hypoxia: Secondary | ICD-10-CM | POA: Diagnosis not present

## 2021-10-11 DIAGNOSIS — I10 Essential (primary) hypertension: Secondary | ICD-10-CM | POA: Diagnosis not present

## 2021-10-11 DIAGNOSIS — D649 Anemia, unspecified: Secondary | ICD-10-CM | POA: Diagnosis not present

## 2021-10-11 DIAGNOSIS — N179 Acute kidney failure, unspecified: Secondary | ICD-10-CM | POA: Diagnosis not present

## 2021-12-08 ENCOUNTER — Other Ambulatory Visit: Payer: Self-pay | Admitting: Family Medicine

## 2022-05-09 ENCOUNTER — Emergency Department
Admission: EM | Admit: 2022-05-09 | Discharge: 2022-05-09 | Disposition: A | Discharge status: Skilled Nursing Facility | Payer: Medicare Other | Attending: Emergency Medicine | Admitting: Emergency Medicine

## 2022-05-09 DIAGNOSIS — E119 Type 2 diabetes mellitus without complications: Secondary | ICD-10-CM | POA: Diagnosis not present

## 2022-05-09 DIAGNOSIS — I509 Heart failure, unspecified: Secondary | ICD-10-CM | POA: Diagnosis not present

## 2022-05-09 DIAGNOSIS — J449 Chronic obstructive pulmonary disease, unspecified: Secondary | ICD-10-CM | POA: Diagnosis not present

## 2022-05-09 DIAGNOSIS — I110 Hypertensive heart disease with heart failure: Secondary | ICD-10-CM | POA: Insufficient documentation

## 2022-05-09 DIAGNOSIS — Z8616 Personal history of COVID-19: Secondary | ICD-10-CM | POA: Insufficient documentation

## 2022-05-09 DIAGNOSIS — K9423 Gastrostomy malfunction: Secondary | ICD-10-CM | POA: Diagnosis present

## 2022-05-09 DIAGNOSIS — J45909 Unspecified asthma, uncomplicated: Secondary | ICD-10-CM | POA: Diagnosis not present

## 2022-05-09 DIAGNOSIS — Z79899 Other long term (current) drug therapy: Secondary | ICD-10-CM | POA: Diagnosis not present

## 2022-05-09 DIAGNOSIS — R6889 Other general symptoms and signs: Secondary | ICD-10-CM

## 2022-05-09 MED ORDER — DIPHENHYDRAMINE HCL 12.5 MG/5ML PO ELIX
50.0000 mg | ORAL_SOLUTION | Freq: Once | ORAL | Status: AC
Start: 2022-05-09 — End: 2022-05-09
  Administered 2022-05-09: 50 mg
  Filled 2022-05-09: qty 20

## 2022-05-09 NOTE — ED Notes (Signed)
Called [facility] for pt Transport back to [facility]

## 2022-05-09 NOTE — Discharge Instructions (Signed)
Return to the ER for recurrent symptoms, persistent vomiting, difficulty breathing or any concerns.

## 2022-05-09 NOTE — ED Triage Notes (Signed)
Pt bib ems from [facility] c/o g tube leaking x tonight.

## 2022-05-09 NOTE — ED Provider Notes (Signed)
[facility] Provider Note    Event Date/Time   First MD Initiated Contact with Patient 05/09/22 [phone]     (approximate)   History   g tube complication    HPI  Alexandra Foster is a 72 y.o. female brought to the ED via EMS from [facility] for G-tube leaking.  Patient states she was soaked from fluids leaking around her G-tube.  Voices no complaints of abdominal pain, nausea/vomiting or diarrhea.     Past Medical History   Past Medical History:  Diagnosis Date   AKI (acute kidney injury) ([place])    a. 04/2021 in setting of bacteremia/shock.   Arthritis    Asthma    Bacteremia    a. 04/2021 S pyogenes bacteremia in setting of lower ext cellulitis.   COPD (chronic obstructive pulmonary disease) (HCC)    Diabetes mellitus without complication (HCC)    Endometriosis    GERD (gastroesophageal reflux disease)    History of echocardiogram    a. 07/2013 Echo: EF 55-60%, impaired relaxation, mild TR; b. 04/2021 Echo: EF 50-55%, mild LVH, nl RV fxn, mild BAE, Ao sclerosis w/o stenosis.   Hypertension    Obesity    PAF (paroxysmal atrial fibrillation) ([place])    a. 04/2021 in setting of septic shock/cellulitis.   Sleep apnea    CPAP     Active Problem List   Patient Active Problem List   Diagnosis Date Noted   Abdominal distension    Left leg cellulitis    Status post tracheostomy ([place]) 05/18/2021   Subdural hemorrhage (HCC) 05/18/2021   Necrotizing fasciitis ([place]) 05/12/2021   Acute and chronic respiratory failure (acute-on-chronic) ([place]) 05/12/2021   On mechanically assisted ventilation ([place]) 05/12/2021   Shock liver 05/12/2021   Anemia associated with acute blood loss 54/65/0354   Metabolic acidosis 65/68/1275   Encounter for continuous renal replacement therapy (CRRT) for acute renal failure ([place]) 05/12/2021   Acute encephalopathy 05/12/2021   Group A streptococcal infection 05/12/2021   Streptococcal toxic shock syndrome ([place]) 05/12/2021    Atrial fibrillation and flutter (HCC)    Severe sepsis with septic shock (CODE) ([place]) 05/04/2021   Personal history of COVID-19 07/14/2019   Bilateral lower extremity edema 05/03/2018   Lower extremity pain, bilateral 05/03/2018   Diabetes mellitus type 2 in obese ([place]) 04/23/2018   Osteoarthritis of both knees 01/14/2018   Urge incontinence 03/14/2016   Osteopenia 02/13/2016   OSA (obstructive sleep apnea) 01/08/2016   Allergic rhinitis 04/13/2015   COPD (chronic obstructive pulmonary disease) ([place]) 09/18/2014   Morbid obesity ([place]) 09/18/2014   Esophagitis, reflux 09/18/2014   Essential hypertension 09/21/2006     Past Surgical History   Past Surgical History:  Procedure Laterality Date   ABDOMINAL HYSTERECTOMY     APPLICATION OF WOUND VAC Left 05/17/2021   Procedure: APPLICATION OF WOUND VAC/WOUND VAC EXCHANGE-Matrix Myriad;  Surgeon: Herbert Pun, MD;  Location: [facility] ORS;  Service: General;  Laterality: Left;   APPLICATION OF WOUND VAC  05/24/2021   Procedure: APPLICATION OF WOUND VAC;  Surgeon: Herbert Pun, MD;  Location: [facility] OR[facility];  Service: General;;   APPLICATION OF WOUND VAC  05/10/2021   Procedure: APPLICATION OF WOUND VAC;  Surgeon: Herbert Pun, MD;  Location: [facility] OR[facility];  Service: General;;   COLONOSCOPY  10/29/2006   Dr Allen Norris   COLONOSCOPY WITH PROPOFOL N/A 11/05/2016   Procedure: COLONOSCOPY WITH PROPOFOL;  Surgeon: Robert Bellow, MD;  Location: [facility] ENDOSCOPY;  Service: Endoscopy;  Laterality: N/A;   INCISION AND DRAINAGE OF WOUND Left 05/24/2021   Procedure: IRRIGATION AND DEBRIDEMENT LEFT LEG;  Surgeon: Herbert Pun, MD;  Location: [facility] OR[facility];  Service: General;  Laterality: Left;   INCISION AND DRAINAGE OF WOUND Left 05/10/2021   Procedure: IRRIGATION AND DEBRIDEMENT LEFT LEG;  Surgeon: Herbert Pun, MD;  Location: [facility] OR[facility];  Service: General;  Laterality: Left;   IR FLUORO GUIDE CV LINE RIGHT  06/12/2021   IR PERC TUN  PERIT CATH WO PORT S&I /IMAG  06/12/2021   IR REPLC GASTRO/COLONIC TUBE PERCUT W/FLUORO  06/12/2021   IVC FILTER INSERTION N/A 05/29/2021   Procedure: IVC FILTER INSERTION;  Surgeon: Katha Cabal, MD;  Location: [place] CV LAB;  Service: Cardiovascular;  Laterality: N/A;   MINOR GRAFT APPLICATION  0/93/2671   Procedure: Myriad Matrix  APPLICATION;  Surgeon: Herbert Pun, MD;  Location: [facility] OR[facility];  Service: General;;   NASAL SINUS SURGERY  2002   Dr Carlis Abbott   PEG PLACEMENT N/A 05/28/2021   Procedure: PERCUTANEOUS ENDOSCOPIC GASTROSTOMY (PEG) PLACEMENT;  Surgeon: Benjamine Sprague, DO;  Location: [facility] ENDOS[facility];  Service: General;  Laterality: N/A;  TRAVEL CASE   TRACHEOSTOMY TUBE PLACEMENT N/A 05/17/2021   Procedure: TRACHEOSTOMY;  Surgeon: Clyde Canterbury, MD;  Location: [facility] OR[facility];  Service: ENT;  Laterality: N/A;   WOUND DEBRIDEMENT Left 05/07/2021   Procedure: DEBRIDEMENT WOUND;  Surgeon: Herbert Pun, MD;  Location: [facility];  Service: General;  Laterality: Left;     Home Medications   Prior to Admission medications   Medication Sig Start Date End Date Taking? Authorizing Provider  amiodarone (PACERONE) 200 MG tablet Place 1 tablet (200 mg total) into feeding tube 2 (two) times daily. 06/13/21   Flora Lipps, MD  artificial tears (LACRILUBE) OINT ophthalmic ointment Place into both eyes every 4 (four) hours as needed for dry eyes. 06/13/21   Flora Lipps, MD  ascorbic acid (VITAMIN C) 500 MG tablet Place 1 tablet (500 mg total) into feeding tube 2 (two) times daily. 06/13/21   Flora Lipps, MD  chlorhexidine gluconate, MEDLINE KIT, (PERIDEX) 0.12 % solution 15 mLs by Mouth Rinse route 2 (two) times daily. 06/13/21   Flora Lipps, MD  cholecalciferol (VITAMIN D) 25 MCG tablet Place 2 tablets (2,000 Units total) into feeding tube daily. 06/14/21   Flora Lipps, MD  collagenase (SANTYL) ointment Apply topically daily. 06/14/21   Flora Lipps, MD  dextrose 5 % SOLN 100 mL with copper chloride  0.4 MG/ML SOLN 2 mg Inject 2 mg into the vein daily. 06/14/21   Flora Lipps, MD  escitalopram (LEXAPRO) 10 MG tablet Place 1 tablet (10 mg total) into feeding tube daily. 06/14/21   Flora Lipps, MD  ipratropium-albuterol (DUONEB) 0.5-2.5 (3) MG/3ML SOLN Take 3 mLs by nebulization 3 (three) times daily. 06/13/21   Flora Lipps, MD  lactobacillus (FLORANEX/LACTINEX) PACK Place 1 packet (1 g total) into feeding tube 3 (three) times daily with meals. 06/13/21   Flora Lipps, MD  lip balm (BLISTEX) OINT Apply 1 application topically 3 (three) times daily. 06/13/21   Flora Lipps, MD  loperamide (IMODIUM) 2 MG capsule Take 1 capsule (2 mg total) by mouth as needed for diarrhea or loose stools. 06/13/21   Flora Lipps, MD  midodrine (PROAMATINE) 5 MG tablet Place 3 tablets (15 mg total) into feeding tube 3 (three) times daily with meals. 06/13/21   Flora Lipps, MD  multivitamin (RENA-VIT) TABS tablet Place 1 tablet into feeding tube at bedtime. 06/13/21  Flora Lipps, MD  pantoprazole sodium (PROTONIX) 40 mg Place 40 mg into feeding tube daily. 06/13/21   Flora Lipps, MD  sennosides (SENOKOT) 8.8 MG/5ML syrup Place 10 mLs into feeding tube at bedtime as needed for moderate constipation. 06/13/21   Flora Lipps, MD     Allergies  Patient has no known allergies.   Family History   Family History  Problem Relation Age of Onset   Congestive Heart Failure Mother    Coronary artery disease Father 74   Breast cancer Sister 33   Heart disease Brother    Varicose Veins Brother    Alcohol abuse Brother      Physical Exam  Triage Vital Signs: ED Triage Vitals  Enc Vitals Group     BP      Pulse      Resp      Temp      Temp src      SpO2      Weight      Height      Head Circumference      Peak Flow      Pain Score      Pain Loc      Pain Edu?      Excl. in [place]?     Updated Vital Signs: BP 96/61   Pulse 62   Temp 98.3 F (36.8 C) (Oral)   Resp 18   SpO2 98%    General: Awake, no distress.   CV:  RRR.  Good peripheral perfusion.  Resp:  Normal effort.  CTAB. Abd:  Obese.  Nontender.  G-tube appears intact.  No distention.  Other:  No truncal vesicles.   ED Results / Procedures / Treatments  Labs (all labs ordered are listed, but only abnormal results are displayed) Labs Reviewed - No data to display   EKG  None   RADIOLOGY None   Official radiology report(s): No results found.   PROCEDURES:  Critical Care performed: No  Procedures   MEDICATIONS ORDERED IN ED: Medications  diphenhydrAMINE (BENADRYL) 12.5 MG/5ML elixir 50 mg (50 mg Per Tube Given 05/09/22 0544)     IMPRESSION / MDM / ASSESSMENT AND PLAN / ED COURSE  I reviewed the triage vital signs and the nursing notes.                             72 year old female sent from [facility] for leaky G-tube.  Tube is not dislodged.  Nursing will troubleshoot.  Patient's presentation is most consistent with acute, uncomplicated illness.  72 Daughter at bedside.  She was not at the facility to witness the leaky G-tube.  Does state 4 days ago facility told her that the tube slid out a little bit and they were able to push it back in.  Nurse flushed G-tube and inflated balloon further.  G-tube flushed well and there is no leak.  Daughter satisfied.  Asking for Benadryl as patient usually gets it PRN each morning between 5 AM and 6 AM for pruritus especially around the G-tube site.  Patient safe and stable for transfer back to [facility].  Strict return precautions given.  Patient and daughter verbalized understanding and agree with plan of care.  FINAL CLINICAL IMPRESSION(S) / ED DIAGNOSES   Final diagnoses:  Complaint associated with gastric tube [facility])     Rx / DC Orders   ED Discharge Orders     None  Note:  This document was prepared using Dragon voice recognition software and may include unintentional dictation errors.   Paulette Blanch, MD 05/09/22 [phone]

## 2022-05-09 NOTE — ED Notes (Signed)
Feeding tube flushed with no complications or resistance. No leakage noted

## 2022-05-09 NOTE — ED Notes (Signed)
Pt resting, denies pain

## 2022-07-22 ENCOUNTER — Emergency Department
Admission: EM | Admit: 2022-07-22 | Discharge: 2022-07-23 | Disposition: A | Discharge status: Home / Self Care | Payer: Medicare Other | Attending: Emergency Medicine | Admitting: Emergency Medicine

## 2022-07-22 DIAGNOSIS — K9423 Gastrostomy malfunction: Secondary | ICD-10-CM | POA: Insufficient documentation

## 2022-07-22 DIAGNOSIS — R1310 Dysphagia, unspecified: Secondary | ICD-10-CM | POA: Diagnosis not present

## 2022-07-22 NOTE — ED Provider Notes (Signed)
[facility] Provider Note    Event Date/Time   First MD Initiated Contact with Patient 07/22/22 2256     (approximate)   History   No chief complaint on file.   HPI  Alexandra Foster is a 72 y.o. female who presents to the ED for evaluation of No chief complaint on file.   I reviewed medical DC summary from prolonged 74-month admission 1 year ago for septic shock, necrotizing fasciitis and various complications.  PEG tube placed at that point due to dysphagia  Patient presents to the ED from her SNF due to her PEG tube being clogged and apparently not working tonight.  No other complaints.  No abdominal pain or fevers.  Physical Exam   Triage Vital Signs: ED Triage Vitals  Enc Vitals Group     BP --      Pulse Rate 07/22/22 2256 (!) 57     Resp 07/22/22 2256 (!) 22     Temp 07/22/22 2256 98.2 F (36.8 C)     Temp Source 07/22/22 2256 Oral     SpO2 07/22/22 2251 96 %     Weight --      Height 07/22/22 2254 5\' 5"  (1.651 m)     Head Circumference --      Peak Flow --      Pain Score 07/22/22 2253 1     Pain Loc --      Pain Edu? --      Excl. in [place]? --     Most recent vital signs: Vitals:   07/22/22 2330 07/23/22 0000  BP: 115/67 114/76  Pulse: (!) 47 (!) 59  Resp: 20 (!) 22  Temp:    SpO2: 97% 98%    General: Awake, no distress.  CV:  Good peripheral perfusion.  Resp:  Normal effort.  Abd:  No distention.  Soft MSK:  No deformity noted.  Neuro:  No focal deficits appreciated. Other:     ED Results / Procedures / Treatments   Labs (all labs ordered are listed, but only abnormal results are displayed) Labs Reviewed - No data to display  EKG   RADIOLOGY   Official radiology report(s): No results found.  PROCEDURES and INTERVENTIONS:  Procedures  Medications  lipase/protease/amylase) (VIOKACE) tablets 20,880 Units (20,880 Units Per Tube Given 07/23/22 0038)    And  sodium bicarbonate tablet 650 mg (650 mg Per  Tube Given 07/23/22 0037)  hydrOXYzine (ATARAX) tablet 50 mg (50 mg Oral Given 07/23/22 0048)  midodrine (PROAMATINE) tablet 5 mg (5 mg Oral Given 07/23/22 0048)     IMPRESSION / MDM / ASSESSMENT AND PLAN / ED COURSE  I reviewed the triage vital signs and the nursing notes.  Patient presents with clogged PEG tube.  We are able to clear it without having to replace it.  Benign abdomen.  Discharged back to SNF with clean dressing on the PEG tube.  Clinical Course as of 07/23/22 0442  Wed Jul 23, 2022  0141 Still clogged after Creon/bicarb instillation.  We will try some soda. May just need to replace it  [DS]  0150 Now it is functional.  I confirm that both ports are functioning [DS]    Clinical Course User Index [DS] Delton Prairie, MD     FINAL CLINICAL IMPRESSION(S) / ED DIAGNOSES   Final diagnoses:  PEG tube malfunction     Rx / DC Orders   ED Discharge Orders     None  Note:  This document was prepared using Dragon voice recognition software and may include unintentional dictation errors.   Delton Prairie, MD 07/23/22 [phone]

## 2022-07-22 NOTE — ED Triage Notes (Signed)
Pt presents to the ED with G-tube problems. The facility noticed that it was clogged tonight. They attempted to get it unclogged without success.

## 2022-07-23 DIAGNOSIS — K9423 Gastrostomy malfunction: Secondary | ICD-10-CM | POA: Diagnosis not present

## 2022-07-23 MED ORDER — SODIUM BICARBONATE 650 MG PO TABS
650.0000 mg | ORAL_TABLET | Freq: Once | ORAL | Status: AC
  Administered 2022-07-23: 650 mg
  Filled 2022-07-23: qty 1

## 2022-07-23 MED ORDER — HYDROXYZINE HCL 25 MG PO TABS
50.0000 mg | ORAL_TABLET | Freq: Once | ORAL | Status: AC
  Administered 2022-07-23: 50 mg via ORAL
  Filled 2022-07-23: qty 2

## 2022-07-23 MED ORDER — MIDODRINE HCL 5 MG PO TABS
5.0000 mg | ORAL_TABLET | Freq: Once | ORAL | Status: AC
  Administered 2022-07-23: 5 mg via ORAL
  Filled 2022-07-23: qty 1

## 2022-07-23 MED ORDER — PANCRELIPASE (LIP-PROT-AMYL) 10440-39150 UNITS PO TABS
20880.0000 [IU] | ORAL_TABLET | Freq: Once | ORAL | Status: AC
  Administered 2022-07-23: 20880 [IU]
  Filled 2022-07-23: qty 2

## 2022-07-23 NOTE — Discharge Instructions (Signed)
PEG tube is now functioning and we changed the dressing.

## 2022-07-23 NOTE — ED Notes (Signed)
called to [facility] for transport to facility/[name]

## 2022-07-23 NOTE — ED Notes (Signed)
G-Tube successfully unclogged. Flushed with 50 ml lukewarm water with no resistance.

## 2022-10-13 ENCOUNTER — Emergency Department: Payer: Medicare Other

## 2022-10-13 ENCOUNTER — Other Ambulatory Visit: Payer: Self-pay

## 2022-10-13 ENCOUNTER — Emergency Department
Admission: EM | Admit: 2022-10-13 | Discharge: 2022-10-13 | Disposition: A | Discharge status: Skilled Nursing Facility | Payer: Medicare Other | Attending: Emergency Medicine | Admitting: Emergency Medicine

## 2022-10-13 DIAGNOSIS — K9423 Gastrostomy malfunction: Secondary | ICD-10-CM | POA: Diagnosis present

## 2022-10-13 DIAGNOSIS — I10 Essential (primary) hypertension: Secondary | ICD-10-CM | POA: Insufficient documentation

## 2022-10-13 DIAGNOSIS — J449 Chronic obstructive pulmonary disease, unspecified: Secondary | ICD-10-CM | POA: Insufficient documentation

## 2022-10-13 DIAGNOSIS — E119 Type 2 diabetes mellitus without complications: Secondary | ICD-10-CM | POA: Insufficient documentation

## 2022-10-13 MED ORDER — PANCRELIPASE (LIP-PROT-AMYL) 10440-39150 UNITS PO TABS
20880.0000 [IU] | ORAL_TABLET | Freq: Once | ORAL | Status: DC
  Filled 2022-10-13: qty 2

## 2022-10-13 MED ORDER — DIATRIZOATE MEGLUMINE & SODIUM 66-10 % PO SOLN
30.0000 mL | Freq: Once | ORAL | Status: AC
  Administered 2022-10-13: 30 mL

## 2022-10-13 MED ORDER — SODIUM BICARBONATE 650 MG PO TABS
650.0000 mg | ORAL_TABLET | Freq: Once | ORAL | Status: DC
  Filled 2022-10-13: qty 1

## 2022-10-13 MED ORDER — DIPHENHYDRAMINE HCL 12.5 MG/5ML PO ELIX
25.0000 mg | ORAL_SOLUTION | Freq: Once | ORAL | Status: AC
  Administered 2022-10-13: 25 mg
  Filled 2022-10-13: qty 10

## 2022-10-13 MED ORDER — LIDOCAINE HCL URETHRAL/MUCOSAL 2 % EX GEL
1.0000 | Freq: Once | CUTANEOUS | Status: DC
  Filled 2022-10-13: qty 10

## 2022-10-13 NOTE — ED Notes (Signed)
MULTIPLE ATTEMPTS TO MAKE CONTACT WITH [facility] BUT NO ANSWER AND NO MESSAGING SYSTEM. FAMILY MADE AWARE OF ATTEMPTS AND MADE AWARE OF DISCHARGE/INSTRUCTIONS.

## 2022-10-13 NOTE — ED Triage Notes (Signed)
Pt has g-tube that is not flushing. 3 Lumen 21.3 cm with 15mL water balloon. EMS reported unable to deflate balloon or fill with saline. Multiple attempts made by staff and EMS to flush g-tube. Pt denies any discomfort at this time. EMS vitals: BP 121/90 HR 70 O2 95% RA

## 2022-10-13 NOTE — ED Provider Notes (Signed)
[facility] Provider Note    Event Date/Time   First MD Initiated Contact with Patient 10/13/22 1818     (approximate)   History   Chief Complaint PEG tube problem  HPI  Alexandra Foster is a 72 y.o. female with past medical history of hypertension, diabetes, COPD, and dysphagia status post PEG tube who presents to the ED complaining of PEG tube problem.  Patient's PEG tube has reportedly been clogged since earlier today, multiple attempts at flushing were made by staff at her nursing facility along with EMS.  Patient denies any associated pain and has not had any nausea or vomiting.      Physical Exam   Triage Vital Signs: ED Triage Vitals [10/13/22 1812]  Enc Vitals Group     BP      Pulse      Resp      Temp      Temp src      SpO2      Weight      Height      Head Circumference      Peak Flow      Pain Score 0     Pain Loc      Pain Edu?      Excl. in GC?     Most recent vital signs: Vitals:   10/13/22 1819 10/13/22 2134  BP: 107/66 115/61  Pulse: (!) 57 63  Resp: 18 20  Temp:  97.7 F (36.5 C)  SpO2: 95% 96%    Constitutional: Alert and oriented. Eyes: Conjunctivae are normal. Head: Atraumatic. Nose: No congestion/rhinnorhea. Mouth/Throat: Mucous membranes are moist.  Cardiovascular: Normal rate, regular rhythm. Grossly normal heart sounds.  2+ radial pulses bilaterally. Respiratory: Normal respiratory effort.  No retractions. Lungs CTAB. Gastrointestinal: Soft and nontender. No distention.  PEG tube intact with no surrounding erythema, warmth, or tenderness. Musculoskeletal: No lower extremity tenderness nor edema.  Neurologic:  Normal speech and language. No gross focal neurologic deficits are appreciated.    ED Results / Procedures / Treatments   Labs (all labs ordered are listed, but only abnormal results are displayed) Labs Reviewed - No data to display  RADIOLOGY Abdominal x-ray reviewed and interpreted by  me with contrast entering the stomach from PEG tube.  PROCEDURES:  Critical Care performed: No  Gastrostomy tube replacement  Date/Time: 10/14/2022 12:04 AM  Performed by: Chesley Noon, MD Authorized by: Chesley Noon, MD  Consent: Verbal consent obtained. Risks and benefits: risks, benefits and alternatives were discussed Consent given by: patient Patient identity confirmed: verbally with patient and arm band Preparation: Patient was prepped and draped in the usual sterile fashion. Local anesthesia used: yes  Anesthesia: Local anesthesia used: yes Local Anesthetic: topical anesthetic  Sedation: Patient sedated: no  Patient tolerance: patient tolerated the procedure well with no immediate complications      MEDICATIONS ORDERED IN ED: Medications  lipase/protease/amylase) (VIOKACE) tablets 20,880 Units (14,782 Units Per Tube Not Given 10/13/22 1900)    And  sodium bicarbonate tablet 650 mg (650 mg Per Tube Not Given 10/13/22 1900)  lidocaine (XYLOCAINE) 2 % jelly 1 Application (has no administration in time range)  diatrizoate meglumine-sodium (GASTROGRAFIN) 66-10 % solution 30 mL (30 mLs Per Tube Given 10/13/22 2023)  diphenhydrAMINE (BENADRYL) 12.5 MG/5ML elixir 25 mg (25 mg Per Tube Given 10/13/22 2141)     IMPRESSION / MDM / ASSESSMENT AND PLAN / ED COURSE  I reviewed the triage vital signs and the  nursing notes.                              72 y.o. female with past medical history of hypertension, diabetes, COPD, and dysphagia status post PEG tube who presents to the ED complaining of clogged PEG tube.  Patient's presentation is most consistent with acute, uncomplicated illness.  Differential diagnosis includes, but is not limited to, clogged tube, tube displacement, site infection.  Patient well-appearing and in no acute distress, vital signs are unremarkable.  Her abdominal exam is benign and tube remains intact.  Attempts at flushing were unsuccessful, tube  successfully replaced with follow-up x-ray showing appropriate positioning.  Patient appropriate for discharge back to nursing facility.      FINAL CLINICAL IMPRESSION(S) / ED DIAGNOSES   Final diagnoses:  PEG tube malfunction (HCC)     Rx / DC Orders   ED Discharge Orders     None        Note:  This document was prepared using Dragon voice recognition software and may include unintentional dictation errors.   Chesley Noon, MD 10/14/22 [phone]

## 2022-11-28 ENCOUNTER — Telehealth: Payer: Self-pay | Admitting: Internal Medicine

## 2022-11-28 NOTE — Telephone Encounter (Signed)
Copied from CRM [id number]. Topic: Appointment Scheduling - Scheduling Inquiry for Clinic >> Nov 27, 2022  2:43 PM Clide Dales wrote: [name] called to see if hospital follow could be moved up to before October 20th. Please advise.

## 2022-11-28 NOTE — Telephone Encounter (Signed)
Pt needs to be advised if she needs to do a HFU. Pt was in the hospital in July. Caller that called in is not on the DPR. Please call pt to advise if she needs to be seen here.

## 2022-12-05 ENCOUNTER — Inpatient Hospital Stay
Admission: EM | Admit: 2022-12-05 | Discharge: 2023-01-03 | DRG: 314 | Disposition: A | Discharge status: Home Health | Payer: Medicare Other | Source: Skilled Nursing Facility | Attending: Internal Medicine | Admitting: Internal Medicine

## 2022-12-05 ENCOUNTER — Emergency Department: Payer: Medicare Other

## 2022-12-05 ENCOUNTER — Other Ambulatory Visit: Payer: Self-pay

## 2022-12-05 DIAGNOSIS — F419 Anxiety disorder, unspecified: Secondary | ICD-10-CM | POA: Diagnosis present

## 2022-12-05 DIAGNOSIS — Z7401 Bed confinement status: Secondary | ICD-10-CM

## 2022-12-05 DIAGNOSIS — Z931 Gastrostomy status: Secondary | ICD-10-CM

## 2022-12-05 DIAGNOSIS — R32 Unspecified urinary incontinence: Secondary | ICD-10-CM | POA: Diagnosis present

## 2022-12-05 DIAGNOSIS — A4102 Sepsis due to Methicillin resistant Staphylococcus aureus: Secondary | ICD-10-CM | POA: Diagnosis present

## 2022-12-05 DIAGNOSIS — E876 Hypokalemia: Secondary | ICD-10-CM | POA: Diagnosis present

## 2022-12-05 DIAGNOSIS — T80211A Bloodstream infection due to central venous catheter, initial encounter: Secondary | ICD-10-CM | POA: Diagnosis not present

## 2022-12-05 DIAGNOSIS — Z992 Dependence on renal dialysis: Secondary | ICD-10-CM

## 2022-12-05 DIAGNOSIS — Z8249 Family history of ischemic heart disease and other diseases of the circulatory system: Secondary | ICD-10-CM

## 2022-12-05 DIAGNOSIS — Z90710 Acquired absence of both cervix and uterus: Secondary | ICD-10-CM

## 2022-12-05 DIAGNOSIS — R1110 Vomiting, unspecified: Secondary | ICD-10-CM

## 2022-12-05 DIAGNOSIS — K7460 Unspecified cirrhosis of liver: Secondary | ICD-10-CM | POA: Diagnosis present

## 2022-12-05 DIAGNOSIS — J690 Pneumonitis due to inhalation of food and vomit: Secondary | ICD-10-CM | POA: Diagnosis not present

## 2022-12-05 DIAGNOSIS — L89322 Pressure ulcer of left buttock, stage 2: Secondary | ICD-10-CM | POA: Diagnosis present

## 2022-12-05 DIAGNOSIS — K219 Gastro-esophageal reflux disease without esophagitis: Secondary | ICD-10-CM | POA: Diagnosis present

## 2022-12-05 DIAGNOSIS — Z1152 Encounter for screening for COVID-19: Secondary | ICD-10-CM

## 2022-12-05 DIAGNOSIS — A4150 Gram-negative sepsis, unspecified: Secondary | ICD-10-CM | POA: Diagnosis present

## 2022-12-05 DIAGNOSIS — Y848 Other medical procedures as the cause of abnormal reaction of the patient, or of later complication, without mention of misadventure at the time of the procedure: Secondary | ICD-10-CM | POA: Diagnosis present

## 2022-12-05 DIAGNOSIS — E871 Hypo-osmolality and hyponatremia: Secondary | ICD-10-CM | POA: Diagnosis present

## 2022-12-05 DIAGNOSIS — R6520 Severe sepsis without septic shock: Secondary | ICD-10-CM | POA: Diagnosis present

## 2022-12-05 DIAGNOSIS — D638 Anemia in other chronic diseases classified elsewhere: Secondary | ICD-10-CM | POA: Insufficient documentation

## 2022-12-05 DIAGNOSIS — E878 Other disorders of electrolyte and fluid balance, not elsewhere classified: Secondary | ICD-10-CM | POA: Diagnosis present

## 2022-12-05 DIAGNOSIS — Z803 Family history of malignant neoplasm of breast: Secondary | ICD-10-CM

## 2022-12-05 DIAGNOSIS — E8809 Other disorders of plasma-protein metabolism, not elsewhere classified: Secondary | ICD-10-CM | POA: Diagnosis present

## 2022-12-05 DIAGNOSIS — I120 Hypertensive chronic kidney disease with stage 5 chronic kidney disease or end stage renal disease: Secondary | ICD-10-CM | POA: Diagnosis present

## 2022-12-05 DIAGNOSIS — Z6834 Body mass index (BMI) 34.0-34.9, adult: Secondary | ICD-10-CM

## 2022-12-05 DIAGNOSIS — L8990 Pressure ulcer of unspecified site, unspecified stage: Secondary | ICD-10-CM | POA: Insufficient documentation

## 2022-12-05 DIAGNOSIS — D631 Anemia in chronic kidney disease: Secondary | ICD-10-CM | POA: Diagnosis present

## 2022-12-05 DIAGNOSIS — R1310 Dysphagia, unspecified: Secondary | ICD-10-CM

## 2022-12-05 DIAGNOSIS — Z79899 Other long term (current) drug therapy: Secondary | ICD-10-CM

## 2022-12-05 DIAGNOSIS — G4733 Obstructive sleep apnea (adult) (pediatric): Secondary | ICD-10-CM | POA: Diagnosis present

## 2022-12-05 DIAGNOSIS — R4182 Altered mental status, unspecified: Secondary | ICD-10-CM | POA: Diagnosis not present

## 2022-12-05 DIAGNOSIS — E669 Obesity, unspecified: Secondary | ICD-10-CM | POA: Insufficient documentation

## 2022-12-05 DIAGNOSIS — I82412 Acute embolism and thrombosis of left femoral vein: Secondary | ICD-10-CM | POA: Diagnosis present

## 2022-12-05 DIAGNOSIS — L89312 Pressure ulcer of right buttock, stage 2: Secondary | ICD-10-CM | POA: Diagnosis present

## 2022-12-05 DIAGNOSIS — I480 Paroxysmal atrial fibrillation: Secondary | ICD-10-CM

## 2022-12-05 DIAGNOSIS — Z811 Family history of alcohol abuse and dependence: Secondary | ICD-10-CM

## 2022-12-05 DIAGNOSIS — Z794 Long term (current) use of insulin: Secondary | ICD-10-CM

## 2022-12-05 DIAGNOSIS — J4489 Other specified chronic obstructive pulmonary disease: Secondary | ICD-10-CM | POA: Diagnosis present

## 2022-12-05 DIAGNOSIS — R7881 Bacteremia: Secondary | ICD-10-CM | POA: Diagnosis present

## 2022-12-05 DIAGNOSIS — I9589 Other hypotension: Secondary | ICD-10-CM | POA: Diagnosis present

## 2022-12-05 DIAGNOSIS — I69354 Hemiplegia and hemiparesis following cerebral infarction affecting left non-dominant side: Secondary | ICD-10-CM

## 2022-12-05 DIAGNOSIS — N390 Urinary tract infection, site not specified: Secondary | ICD-10-CM | POA: Diagnosis present

## 2022-12-05 DIAGNOSIS — Z89612 Acquired absence of left leg above knee: Secondary | ICD-10-CM

## 2022-12-05 DIAGNOSIS — D75838 Other thrombocytosis: Secondary | ICD-10-CM | POA: Diagnosis present

## 2022-12-05 DIAGNOSIS — Z5320 Procedure and treatment not carried out because of patient's decision for unspecified reasons: Secondary | ICD-10-CM | POA: Diagnosis present

## 2022-12-05 DIAGNOSIS — F32A Depression, unspecified: Secondary | ICD-10-CM | POA: Insufficient documentation

## 2022-12-05 DIAGNOSIS — N2581 Secondary hyperparathyroidism of renal origin: Secondary | ICD-10-CM | POA: Diagnosis present

## 2022-12-05 DIAGNOSIS — E785 Hyperlipidemia, unspecified: Secondary | ICD-10-CM | POA: Diagnosis present

## 2022-12-05 DIAGNOSIS — Z5986 Financial insecurity: Secondary | ICD-10-CM

## 2022-12-05 DIAGNOSIS — R6521 Severe sepsis with septic shock: Secondary | ICD-10-CM | POA: Diagnosis present

## 2022-12-05 DIAGNOSIS — G9341 Metabolic encephalopathy: Secondary | ICD-10-CM

## 2022-12-05 DIAGNOSIS — R29810 Facial weakness: Secondary | ICD-10-CM | POA: Diagnosis present

## 2022-12-05 DIAGNOSIS — B9562 Methicillin resistant Staphylococcus aureus infection as the cause of diseases classified elsewhere: Secondary | ICD-10-CM | POA: Diagnosis present

## 2022-12-05 DIAGNOSIS — Z87891 Personal history of nicotine dependence: Secondary | ICD-10-CM

## 2022-12-05 DIAGNOSIS — R197 Diarrhea, unspecified: Secondary | ICD-10-CM | POA: Insufficient documentation

## 2022-12-05 DIAGNOSIS — N186 End stage renal disease: Secondary | ICD-10-CM | POA: Diagnosis present

## 2022-12-05 DIAGNOSIS — A419 Sepsis, unspecified organism: Principal | ICD-10-CM

## 2022-12-05 DIAGNOSIS — Z66 Do not resuscitate: Secondary | ICD-10-CM | POA: Diagnosis present

## 2022-12-05 DIAGNOSIS — E1142 Type 2 diabetes mellitus with diabetic polyneuropathy: Secondary | ICD-10-CM | POA: Diagnosis present

## 2022-12-05 DIAGNOSIS — E1122 Type 2 diabetes mellitus with diabetic chronic kidney disease: Secondary | ICD-10-CM

## 2022-12-05 DIAGNOSIS — Z7951 Long term (current) use of inhaled steroids: Secondary | ICD-10-CM

## 2022-12-05 DIAGNOSIS — L299 Pruritus, unspecified: Secondary | ICD-10-CM | POA: Diagnosis present

## 2022-12-05 LAB — CBC WITH DIFFERENTIAL/PLATELET
Abs Immature Granulocytes: 0.42 10*3/uL — ABNORMAL HIGH (ref 0.00–0.07)
Basophils Absolute: 0.1 10*3/uL (ref 0.0–0.1)
Basophils Relative: 0 %
Eosinophils Absolute: 0 10*3/uL (ref 0.0–0.5)
Eosinophils Relative: 0 %
HCT: 33.4 % — ABNORMAL LOW (ref 36.0–46.0)
Hemoglobin: 10.2 g/dL — ABNORMAL LOW (ref 12.0–15.0)
Immature Granulocytes: 2 %
Lymphocytes Relative: 3 %
Lymphs Abs: 0.6 10*3/uL — ABNORMAL LOW (ref 0.7–4.0)
MCH: 25.8 pg — ABNORMAL LOW (ref 26.0–34.0)
MCHC: 30.5 g/dL (ref 30.0–36.0)
MCV: 84.3 fL (ref 80.0–100.0)
Monocytes Absolute: 0.8 10*3/uL (ref 0.1–1.0)
Monocytes Relative: 4 %
Neutro Abs: 19.4 10*3/uL — ABNORMAL HIGH (ref 1.7–7.7)
Neutrophils Relative %: 91 %
Platelets: 411 10*3/uL — ABNORMAL HIGH (ref 150–400)
RBC: 3.96 MIL/uL (ref 3.87–5.11)
RDW: 15.6 % — ABNORMAL HIGH (ref 11.5–15.5)
Smear Review: NORMAL
WBC: 21.4 10*3/uL — ABNORMAL HIGH (ref 4.0–10.5)
nRBC: 3.7 % — ABNORMAL HIGH (ref 0.0–0.2)

## 2022-12-05 LAB — RESP PANEL BY RT-PCR (RSV, FLU A&B, COVID)  RVPGX2
Influenza A by PCR: NEGATIVE
Influenza B by PCR: NEGATIVE
Resp Syncytial Virus by PCR: NEGATIVE
SARS Coronavirus 2 by RT PCR: NEGATIVE

## 2022-12-05 LAB — LACTIC ACID, PLASMA: Lactic Acid, Venous: 2.3 mmol/L (ref 0.5–1.9)

## 2022-12-05 LAB — COMPREHENSIVE METABOLIC PANEL
ALT: 87 U/L — ABNORMAL HIGH (ref 0–44)
AST: 66 U/L — ABNORMAL HIGH (ref 15–41)
Albumin: 3.3 g/dL — ABNORMAL LOW (ref 3.5–5.0)
Alkaline Phosphatase: 157 U/L — ABNORMAL HIGH (ref 38–126)
Anion gap: 16 — ABNORMAL HIGH (ref 5–15)
BUN: 67 mg/dL — ABNORMAL HIGH (ref 8–23)
CO2: 22 mmol/L (ref 22–32)
Calcium: 9.6 mg/dL (ref 8.9–10.3)
Chloride: 90 mmol/L — ABNORMAL LOW (ref 98–111)
Creatinine, Ser: 4.53 mg/dL — ABNORMAL HIGH (ref 0.44–1.00)
GFR, Estimated: 10 mL/min — ABNORMAL LOW (ref >60)
Glucose, Bld: 197 mg/dL — ABNORMAL HIGH (ref 70–99)
Potassium: 4.2 mmol/L (ref 3.5–5.1)
Sodium: 128 mmol/L — ABNORMAL LOW (ref 135–145)
Total Bilirubin: 0.6 mg/dL (ref 0.3–1.2)
Total Protein: 8.6 g/dL — ABNORMAL HIGH (ref 6.5–8.1)

## 2022-12-05 LAB — APTT: aPTT: 33 seconds (ref 24–36)

## 2022-12-05 LAB — PROTIME-INR
INR: 1.1 (ref 0.8–1.2)
Prothrombin Time: 14.1 seconds (ref 11.4–15.2)

## 2022-12-05 LAB — CBG MONITORING, ED: Glucose-Capillary: 178 mg/dL — ABNORMAL HIGH (ref 70–99)

## 2022-12-05 LAB — ETHANOL: Alcohol, Ethyl (B): <10 mg/dL (ref <10)

## 2022-12-05 MED ORDER — SODIUM CHLORIDE 0.9 % IV SOLN
2.0000 g | INTRAVENOUS | Status: DC
  Administered 2022-12-05: 2 g via INTRAVENOUS
  Filled 2022-12-05: qty 20

## 2022-12-05 MED ORDER — SODIUM CHLORIDE 0.9 % IV SOLN
500.0000 mg | INTRAVENOUS | Status: DC
  Administered 2022-12-05: 500 mg via INTRAVENOUS
  Filled 2022-12-05 (×2): qty 5

## 2022-12-05 MED ORDER — SODIUM CHLORIDE 0.9 % IV BOLUS (SEPSIS)
1000.0000 mL | Freq: Once | INTRAVENOUS | Status: AC
  Administered 2022-12-05: 1000 mL via INTRAVENOUS

## 2022-12-05 MED ORDER — SODIUM CHLORIDE 0.9 % IV BOLUS (SEPSIS)
1000.0000 mL | Freq: Once | INTRAVENOUS | Status: AC
  Administered 2022-12-06: 1000 mL via INTRAVENOUS

## 2022-12-05 MED ORDER — LACTATED RINGERS IV SOLN: INTRAVENOUS | Status: DC

## 2022-12-05 NOTE — ED Notes (Signed)
Patient in CT

## 2022-12-05 NOTE — ED Notes (Signed)
Neurotele at the bedside.

## 2022-12-05 NOTE — Progress Notes (Signed)
   12/05/22 2100  Spiritual Encounters  Type of Visit Attempt (pt unavailable)  Referral source Code page  Reason for visit Code  OnCall Visit Yes  Spiritual Care Plan  Spiritual Care Issues Still Outstanding Chaplain will continue to follow   Receive stroke code page for patient. Patient was in CT and proceeded to be exam by medical staff. Was unable to see patient at that time but will follow up with patient later. There was no family with patient during her arrive to hospital. Ask medical team in ER waiting area and nurses to contact me if family for patient is to show up.

## 2022-12-05 NOTE — Progress Notes (Addendum)
Code stroke activated at 2117. Stroke cart immediately lost power.  TS RN connected to cart in CT while patient in CT and received information. MRS 4. Initial cart fixed and TS RN moved to it.  Dr. Felix Ahmadi connected at 2131 as patient returned from CT.  No TNK, LKW unknown.

## 2022-12-05 NOTE — ED Notes (Signed)
History of dialysis, one abx given at a time.

## 2022-12-05 NOTE — ED Notes (Signed)
Lactic 2.3  Collected 2058 12/05/22

## 2022-12-05 NOTE — ED Notes (Signed)
Activate Code Stroke w/ [facility]

## 2022-12-05 NOTE — ED Provider Notes (Signed)
[facility] Provider Note   Event Date/Time   First MD Initiated Contact with Patient 12/05/22 2051     (approximate) History  Altered Mental Status (Brought in by medic from [facility] [employer]), with AMS/ lethargy. Last well, unknown., According to medic baseline unknown. Facility currently ruling out aspiration pna. History of dialysis, g-tube. Temp 102.2 when medic arrived to facility. )  HPI Alexandra Foster is a 72 y.o. female brought in by EMS from [facility] due to altered mental status.  EMS noted staff states patient was having slurred speech and significant weakness per family who has not seen her for the last week.  Facility also states that patient is being ruled out for aspiration pneumonia.  Patient has a history of dialysis and G-tube with a temperature of 102 on medic arrival to the facility.  Patient has not had any complaints throughout transport.  Patient is not responding to questioning at this time. ROS: Unable to assess   Physical Exam  Triage Vital Signs: ED Triage Vitals  Encounter Vitals Group     BP 12/05/22 2046 125/70     Systolic BP Percentile --      Diastolic BP Percentile --      Pulse Rate 12/05/22 2047 78     Resp 12/05/22 2050 19     Temp 12/05/22 2103 (!) 101.4 F (38.6 C)     Temp Source 12/05/22 2103 Oral     SpO2 12/05/22 2047 99 %     Weight 12/05/22 2101 186 lb 12.8 oz (84.7 kg)     Height 12/05/22 2103 5\' 4"  (1.626 m)     Head Circumference --      Peak Flow --      Pain Score --      Pain Loc --      Pain Education --      Exclude from Growth Chart --    Most recent vital signs: Vitals:   12/06/22 0000 12/06/22 0004  BP: (!) 111/92   Pulse: 64   Resp:    Temp:  99.9 F (37.7 C)  SpO2: 97%    General: Awake, cooperative CV:  Good peripheral perfusion.  Resp:  Normal effort.  Abd:  No distention.  Other:  Elderly obese African-American female resting comfortably in no acute distress.  Patient  has inability to raise upper extremities.  Speech dysarthric ED Results / Procedures / Treatments  Labs (all labs ordered are listed, but only abnormal results are displayed) Labs Reviewed  COMPREHENSIVE METABOLIC PANEL - Abnormal; Notable for the following components:      Result Value   Sodium 128 (*)    Chloride 90 (*)    Glucose, Bld 197 (*)    BUN 67 (*)    Creatinine, Ser 4.53 (*)    Total Protein 8.6 (*)    Albumin 3.3 (*)    AST 66 (*)    ALT 87 (*)    Alkaline Phosphatase 157 (*)    GFR, Estimated 10 (*)    Anion gap 16 (*)    All other components within normal limits  LACTIC ACID, PLASMA - Abnormal; Notable for the following components:   Lactic Acid, Venous 2.3 (*)    All other components within normal limits  CBC WITH DIFFERENTIAL/PLATELET - Abnormal; Notable for the following components:   WBC 21.4 (*)    Hemoglobin 10.2 (*)    HCT 33.4 (*)    MCH 25.8 (*)  RDW 15.6 (*)    Platelets 411 (*)    nRBC 3.7 (*)    Neutro Abs 19.4 (*)    Lymphs Abs 0.6 (*)    Abs Immature Granulocytes 0.42 (*)    All other components within normal limits  CBG MONITORING, ED - Abnormal; Notable for the following components:   Glucose-Capillary 178 (*)    All other components within normal limits  RESP PANEL BY RT-PCR (RSV, FLU A&B, COVID)  RVPGX2  CULTURE, BLOOD (ROUTINE X 2)  CULTURE, BLOOD (ROUTINE X 2)  PROTIME-INR  ETHANOL  APTT  LACTIC ACID, PLASMA  URINALYSIS, W/ REFLEX TO CULTURE (INFECTION SUSPECTED)  URINE DRUG SCREEN, QUALITATIVE ([facility] ONLY)   RADIOLOGY ED MD interpretation: CT of the head without contrast interpreted by me shows no evidence of acute abnormalities including no intracerebral hemorrhage, obvious masses, or significant edema  One-view portable chest x-ray interpreted by me shows no evidence of acute abnormalities including no pneumonia, pneumothorax, or widened mediastinum -Agree with radiology assessment Official radiology report(s): CT HEAD CODE  STROKE WO CONTRAST  Result Date: 12/05/2022 CLINICAL DATA:  Code stroke.  Right facial droop and slurred speech EXAM: CT HEAD WITHOUT CONTRAST TECHNIQUE: Contiguous axial images were obtained from the base of the skull through the vertex without intravenous contrast. RADIATION DOSE REDUCTION: This exam was performed according to the departmental dose-optimization program which includes automated exposure control, adjustment of the mA and/or kV according to patient size and/or use of iterative reconstruction technique. COMPARISON:  None Available. FINDINGS: Brain: There is no mass, hemorrhage or extra-axial collection. The size and configuration of the ventricles and extra-axial CSF spaces are normal. The brain parenchyma is normal, without evidence of acute or chronic infarction. Vascular: No abnormal hyperdensity of the major intracranial arteries or dural venous sinuses. No intracranial atherosclerosis. Skull: The visualized skull base, calvarium and extracranial soft tissues are normal. Sinuses/Orbits: No fluid levels or advanced mucosal thickening of the visualized paranasal sinuses. No mastoid or middle ear effusion. The orbits are normal. ASPECTS [facility] Stroke Program Early CT Score) - Ganglionic level infarction (caudate, lentiform nuclei, internal capsule, insula, M1-M3 cortex): 7 - Supraganglionic infarction (M4-M6 cortex): 3 Total score (0-10 with 10 being normal): 10 IMPRESSION: 1. No acute intracranial abnormality. 2. ASPECTS is 10. These results were called by telephone at the time of interpretation on 12/05/2022 at 9:39 pm to provider [facility] , who verbally acknowledged these results. Electronically Signed   By: Deatra Robinson M.D.   On: 12/05/2022 21:39   DG Chest Port 1 View  Result Date: 12/05/2022 CLINICAL DATA:  Chest pain EXAM: PORTABLE CHEST 1 VIEW COMPARISON:  06/10/2021 FINDINGS: Cardiac shadow is mildly enlarged. Dialysis catheter is noted on the right new from the prior exam.  Lungs are clear bilaterally. Mild central vascular prominence is noted likely related to volume overload. No effusion is seen. No bony abnormality is noted. IMPRESSION: Mild vascular congestion likely related to volume overload. Electronically Signed   By: Alcide Clever M.D.   On: 12/05/2022 21:21   PROCEDURES: Critical Care performed: No .1-3 Lead EKG Interpretation  Performed by: Merwyn Katos, MD Authorized by: Merwyn Katos, MD     Interpretation: normal     ECG rate:  71   ECG rate assessment: normal     Rhythm: sinus rhythm     Ectopy: none     Conduction: normal    MEDICATIONS ORDERED IN ED: Medications  lactated ringers infusion (has no administration in  time range)  sodium chloride 0.9 % bolus 1,000 mL (0 mLs Intravenous Stopped 12/06/22 0001)    And  sodium chloride 0.9 % bolus 1,000 mL (1,000 mLs Intravenous New Bag/Given 12/06/22 0001)    And  sodium chloride 0.9 % bolus 1,000 mL (has no administration in time range)  cefTRIAXone (ROCEPHIN) 2 g in sodium chloride 0.9 % 100 mL IVPB (0 g Intravenous Stopped 12/05/22 2222)  azithromycin (ZITHROMAX) 500 mg in sodium chloride 0.9 % 250 mL IVPB (0 mg Intravenous Stopped 12/05/22 2325)   IMPRESSION / MDM / ASSESSMENT AND PLAN / ED COURSE  I reviewed the triage vital signs and the nursing notes.                             The patient is on the cardiac monitor to evaluate for evidence of arrhythmia and/or significant heart rate changes. Patient's presentation is most consistent with acute presentation with potential threat to life or bodily function. The Pt presents with fever and altered mental status highly concerning for sepsis however patient has new dysarthria and weakness and therefore code stroke was called on arrival. At this time, the Pt is satting well on room air, normotensive, and appears HDS.  Will start empiric antibiotics and fluids.  Due to hypotension, will administer fluids gradually with frequent reassessment.  Have low suspicion for a GI, skin/soft tissue, or CNS source at this time, but will reconsider if initial workup is unremarkable.  - CBC, BMP, LFTs - VBG - UA - BCx x2, Lactate - EKG - CXR - CT head shows no evidence of stroke - Empiric Abx: Rocephin and azithromycin  - Fluids: 30cc/kg NS  Care of this patient will be signed out to the oncoming physician at the end of my shift.  All pertinent patient information conveyed and all questions answered.  All further care and disposition decisions will be made by the oncoming physician.   FINAL CLINICAL IMPRESSION(S) / ED DIAGNOSES   Final diagnoses:  None   Rx / DC Orders   ED Discharge Orders     None      Note:  This document was prepared using Dragon voice recognition software and may include unintentional dictation errors.   Merwyn Katos, MD 12/06/22 [phone]

## 2022-12-05 NOTE — Progress Notes (Signed)
E[facility] monitoring for the code sepsis protocol.  

## 2022-12-05 NOTE — Consult Note (Signed)
[facility]rology Consult Services   Patient Name:   Alexandra Foster, Alexandra Foster Date of Birth:   1950/06/27 Identification Number:   MRN - [id number] Date of Service:   12/05/2022 21:29:53  Diagnosis:       G93.41 - Encephalopathy Metabolic  Impression:      72 yo woman presenting for evaluation of altered mental status and weakness. Unclear time LKN and therefore not a candidate for thrombolytics. Overall I have a low suspicion for stroke given the seemingly global symptoms and multiple metabolic abnormalities on labs.    Consider brain MRI or EEG if she does not improve with medical treatment or if family arrives with more history concerning for primary neurologic cause.  Our recommendations are outlined below.  Recommendations:        Stroke/Telemetry Floor       Neuro Checks       Bedside Swallow Eval       DVT Prophylaxis       IV Fluids, Normal Saline       Head of Bed 30 Degrees       Euglycemia and Avoid Hyperthermia (PRN Acetaminophen)  Sign Out:       Discussed with Emergency Department Provider    ------------------------------------------------------------------------------  Advanced Imaging: Advanced Imaging Deferred because:  Stroke not suspected with clinical presentation and exam  Does not meet criteria due to being out of the 24-hour window for thrombectomy   Metrics: Last Known Well: Unknown [facility] Notification Time: 12/05/2022 21:29:53 Arrival Time: 12/05/2022 20:40:00 Stamp Time: 12/05/2022 21:29:53 Initial Response Time: 12/05/2022 21:31:27 Symptoms: weakness and slurred speech. Initial patient interaction: 12/05/2022 21:34:28 NIHSS Assessment Completed: 12/05/2022 08:65:78 Patient is not a candidate for Thrombolytic. Thrombolytic Medical Decision: 12/05/2022 46:96:29 Patient was not deemed candidate for Thrombolytic because of following reasons: Last Well Known Above 4.5 Hours.  CT head showed no acute hemorrhage or acute core  infarct.  Primary Provider Notified of Diagnostic Impression and Management Plan on: 12/05/2022 21:57:28    ------------------------------------------------------------------------------  History of Present Illness: Patient is a 72 year old Female.  Patient was brought by EMS for symptoms of weakness and slurred speech. This is a 72 yo woman presenting from a skilled nursing facility after being found unresponsive and weak. There is no clear last known normal.  On my exam, she is somnolent and weak throughout, perhaps more so on the left but it is very difficult to assess due to the somnolence. She has a left BKA. She only follows minimal commands, requiring significant prompting to wake up.  Labs are notable for multiple metabolic abnormalities. Head CT neg.   Past Medical History:      Hypertension      Diabetes Mellitus  Medications:  No Anticoagulant use  No Antiplatelet use Reviewed EMR for current medications  Allergies:   Allergies Unable To Obtain Due To: Patient Is Obtunded/ Comatose  Social History: Unable To Obtain Due To Patient Status : Patient Is Obtunded/ Comatose  Family History:  Family History Cannot Be Obtained Because:Patient Is Obtunded/ Comatose  ROS : ROS Cannot Be Obtained Because:  Patient Is Obtunded/ Comatose  Past Surgical History: Past Surgical History Cannot Be Obtained Because: Patient Is Obtunded/ Comatose    Examination: BP(125/70), Pulse(78), Blood Glucose(178) 1A: Level of Consciousness - Requires repeated stimulation to arouse + 2 1B: Ask Month and Age - Aphasic + 2 1C: Blink Eyes & Squeeze Hands - Performs 1 Task + 1 2: Test Horizontal Extraocular Movements - Normal + 0 3:  Test Visual Fields - No Visual Loss + 0 4: Test Facial Palsy (Use Grimace if Obtunded) - Minor paralysis (flat nasolabial fold, smile asymmetry) + 1 5A: Test Left Arm Motor Drift - No Movement + 4 5B: Test Right Arm Motor Drift - No Effort Against Gravity  + 3 6A: Test Left Leg Motor Drift - No Movement + 4 6B: Test Right Leg Motor Drift - No Effort Against Gravity + 3 7: Test Limb Ataxia (FNF/Heel-Shin) - No Ataxia + 0 8: Test Sensation - Normal; No sensory loss + 0 9: Test Language/Aphasia - Severe Aphasia: Fragmentary Expression, Inference Needed, Cannot Identify Materials + 2 10: Test Dysarthria - Mild-Moderate Dysarthria: Slurring but can be understood + 1 11: Test Extinction/Inattention - No abnormality + 0  NIHSS Score: 23   Pre-Morbid Modified Rankin Scale: Unable to assess  Spoke with : Dr. Vicente Males  This consult was conducted in real time using interactive audio and Immunologist. Patient was informed of the technology being used for this visit and agreed to proceed. Patient located in hospital and provider located at home/office setting.   Patient is being evaluated for possible acute neurologic impairment and high probability of imminent or life-threatening deterioration. I spent total of 30 minutes providing care to this patient, including time for face to face visit via telemedicine, review of medical records, imaging studies and discussion of findings with providers, the patient and/or family.   Dr Parthenia Ames   [facility] For Inpatient follow-up with [facility] physician please call [facility] [phone]. This is not an outpatient service. Post hospital discharge, please contact hospital directly.  Please do not communicate with [facility] physicians via secure chat. If you have any questions, Please contact [facility]. Please call or reconsult our service if there are any clinical or diagnostic changes.

## 2022-12-05 NOTE — ED Notes (Signed)
Patient unable to lift BUE - unknown baseline, from snf.

## 2022-12-05 NOTE — Consult Note (Signed)
CODE SEPSIS - PHARMACY COMMUNICATION  **Broad Spectrum Antibiotics should be administered within 1 hour of Sepsis diagnosis**  Time Code Sepsis Called/Page Received: 2119  Antibiotics Ordered: Ceftriaxone and Azithromycin  Time of 1st antibiotic administration: 2145  Additional action taken by pharmacy: none  If necessary, Name of Provider/Nurse Contacted: n/a    Alexandra Foster PharmD, BCPS 12/05/2022 9:28 PM

## 2022-12-06 DIAGNOSIS — K7460 Unspecified cirrhosis of liver: Secondary | ICD-10-CM | POA: Diagnosis present

## 2022-12-06 DIAGNOSIS — N186 End stage renal disease: Secondary | ICD-10-CM

## 2022-12-06 DIAGNOSIS — E669 Obesity, unspecified: Secondary | ICD-10-CM | POA: Diagnosis not present

## 2022-12-06 DIAGNOSIS — F32A Depression, unspecified: Secondary | ICD-10-CM | POA: Insufficient documentation

## 2022-12-06 DIAGNOSIS — I82412 Acute embolism and thrombosis of left femoral vein: Secondary | ICD-10-CM | POA: Diagnosis present

## 2022-12-06 DIAGNOSIS — T80211A Bloodstream infection due to central venous catheter, initial encounter: Secondary | ICD-10-CM | POA: Diagnosis present

## 2022-12-06 DIAGNOSIS — E1122 Type 2 diabetes mellitus with diabetic chronic kidney disease: Secondary | ICD-10-CM | POA: Diagnosis present

## 2022-12-06 DIAGNOSIS — J690 Pneumonitis due to inhalation of food and vomit: Secondary | ICD-10-CM | POA: Diagnosis not present

## 2022-12-06 DIAGNOSIS — G459 Transient cerebral ischemic attack, unspecified: Secondary | ICD-10-CM | POA: Diagnosis not present

## 2022-12-06 DIAGNOSIS — Y848 Other medical procedures as the cause of abnormal reaction of the patient, or of later complication, without mention of misadventure at the time of the procedure: Secondary | ICD-10-CM | POA: Diagnosis present

## 2022-12-06 DIAGNOSIS — Z7189 Other specified counseling: Secondary | ICD-10-CM | POA: Diagnosis not present

## 2022-12-06 DIAGNOSIS — A4150 Gram-negative sepsis, unspecified: Secondary | ICD-10-CM

## 2022-12-06 DIAGNOSIS — N390 Urinary tract infection, site not specified: Secondary | ICD-10-CM

## 2022-12-06 DIAGNOSIS — L89312 Pressure ulcer of right buttock, stage 2: Secondary | ICD-10-CM | POA: Diagnosis present

## 2022-12-06 DIAGNOSIS — R6521 Severe sepsis with septic shock: Secondary | ICD-10-CM

## 2022-12-06 DIAGNOSIS — A4102 Sepsis due to Methicillin resistant Staphylococcus aureus: Secondary | ICD-10-CM | POA: Diagnosis present

## 2022-12-06 DIAGNOSIS — I480 Paroxysmal atrial fibrillation: Secondary | ICD-10-CM | POA: Diagnosis not present

## 2022-12-06 DIAGNOSIS — R4182 Altered mental status, unspecified: Secondary | ICD-10-CM | POA: Diagnosis present

## 2022-12-06 DIAGNOSIS — A419 Sepsis, unspecified organism: Secondary | ICD-10-CM

## 2022-12-06 DIAGNOSIS — Z794 Long term (current) use of insulin: Secondary | ICD-10-CM

## 2022-12-06 DIAGNOSIS — G9341 Metabolic encephalopathy: Secondary | ICD-10-CM

## 2022-12-06 DIAGNOSIS — F419 Anxiety disorder, unspecified: Secondary | ICD-10-CM | POA: Insufficient documentation

## 2022-12-06 DIAGNOSIS — R1310 Dysphagia, unspecified: Secondary | ICD-10-CM | POA: Diagnosis not present

## 2022-12-06 DIAGNOSIS — Z992 Dependence on renal dialysis: Secondary | ICD-10-CM

## 2022-12-06 DIAGNOSIS — Z1152 Encounter for screening for COVID-19: Secondary | ICD-10-CM | POA: Diagnosis not present

## 2022-12-06 DIAGNOSIS — R7881 Bacteremia: Secondary | ICD-10-CM | POA: Diagnosis present

## 2022-12-06 DIAGNOSIS — N184 Chronic kidney disease, stage 4 (severe): Secondary | ICD-10-CM

## 2022-12-06 DIAGNOSIS — E871 Hypo-osmolality and hyponatremia: Secondary | ICD-10-CM | POA: Diagnosis present

## 2022-12-06 DIAGNOSIS — R112 Nausea with vomiting, unspecified: Secondary | ICD-10-CM | POA: Diagnosis not present

## 2022-12-06 DIAGNOSIS — B9562 Methicillin resistant Staphylococcus aureus infection as the cause of diseases classified elsewhere: Secondary | ICD-10-CM | POA: Diagnosis not present

## 2022-12-06 DIAGNOSIS — J4489 Other specified chronic obstructive pulmonary disease: Secondary | ICD-10-CM | POA: Diagnosis present

## 2022-12-06 DIAGNOSIS — T8571XA Infection and inflammatory reaction due to peritoneal dialysis catheter, initial encounter: Secondary | ICD-10-CM | POA: Diagnosis not present

## 2022-12-06 DIAGNOSIS — Z9889 Other specified postprocedural states: Secondary | ICD-10-CM | POA: Diagnosis not present

## 2022-12-06 DIAGNOSIS — D638 Anemia in other chronic diseases classified elsewhere: Secondary | ICD-10-CM | POA: Diagnosis not present

## 2022-12-06 DIAGNOSIS — N2581 Secondary hyperparathyroidism of renal origin: Secondary | ICD-10-CM | POA: Diagnosis present

## 2022-12-06 DIAGNOSIS — I69354 Hemiplegia and hemiparesis following cerebral infarction affecting left non-dominant side: Secondary | ICD-10-CM | POA: Diagnosis not present

## 2022-12-06 DIAGNOSIS — L89322 Pressure ulcer of left buttock, stage 2: Secondary | ICD-10-CM | POA: Diagnosis present

## 2022-12-06 DIAGNOSIS — Z66 Do not resuscitate: Secondary | ICD-10-CM | POA: Diagnosis present

## 2022-12-06 DIAGNOSIS — I120 Hypertensive chronic kidney disease with stage 5 chronic kidney disease or end stage renal disease: Secondary | ICD-10-CM | POA: Diagnosis present

## 2022-12-06 DIAGNOSIS — D631 Anemia in chronic kidney disease: Secondary | ICD-10-CM | POA: Diagnosis present

## 2022-12-06 LAB — BLOOD CULTURE ID PANEL (REFLEXED) - BCID2
A.calcoaceticus-baumannii: NOT DETECTED
Bacteroides fragilis: NOT DETECTED
Candida albicans: NOT DETECTED
Candida auris: NOT DETECTED
Candida glabrata: NOT DETECTED
Candida krusei: NOT DETECTED
Candida parapsilosis: NOT DETECTED
Candida tropicalis: NOT DETECTED
Cryptococcus neoformans/gattii: NOT DETECTED
Enterobacter cloacae complex: NOT DETECTED
Enterobacterales: NOT DETECTED
Enterococcus Faecium: NOT DETECTED
Enterococcus faecalis: NOT DETECTED
Escherichia coli: NOT DETECTED
Haemophilus influenzae: NOT DETECTED
Klebsiella aerogenes: NOT DETECTED
Klebsiella oxytoca: NOT DETECTED
Klebsiella pneumoniae: NOT DETECTED
Listeria monocytogenes: NOT DETECTED
Meth resistant mecA/C and MREJ: DETECTED — AB
Methicillin resistance mecA/C: DETECTED — AB
Neisseria meningitidis: NOT DETECTED
Proteus species: NOT DETECTED
Pseudomonas aeruginosa: NOT DETECTED
Salmonella species: NOT DETECTED
Serratia marcescens: NOT DETECTED
Staphylococcus aureus (BCID): DETECTED — AB
Staphylococcus epidermidis: DETECTED — AB
Staphylococcus lugdunensis: NOT DETECTED
Staphylococcus species: DETECTED — AB
Stenotrophomonas maltophilia: NOT DETECTED
Streptococcus agalactiae: NOT DETECTED
Streptococcus pneumoniae: NOT DETECTED
Streptococcus pyogenes: NOT DETECTED
Streptococcus species: NOT DETECTED

## 2022-12-06 LAB — RENAL FUNCTION PANEL
Albumin: 2.5 g/dL — ABNORMAL LOW (ref 3.5–5.0)
Albumin: 2.3 g/dL — ABNORMAL LOW (ref 3.5–5.0)
Anion gap: 15 (ref 5–15)
Anion gap: 12 (ref 5–15)
BUN: 66 mg/dL — ABNORMAL HIGH (ref 8–23)
BUN: 54 mg/dL — ABNORMAL HIGH (ref 8–23)
CO2: 19 mmol/L — ABNORMAL LOW (ref 22–32)
CO2: 20 mmol/L — ABNORMAL LOW (ref 22–32)
Calcium: 8.7 mg/dL — ABNORMAL LOW (ref 8.9–10.3)
Calcium: 8 mg/dL — ABNORMAL LOW (ref 8.9–10.3)
Chloride: 99 mmol/L (ref 98–111)
Chloride: 100 mmol/L (ref 98–111)
Creatinine, Ser: 4.34 mg/dL — ABNORMAL HIGH (ref 0.44–1.00)
Creatinine, Ser: 3.26 mg/dL — ABNORMAL HIGH (ref 0.44–1.00)
GFR, Estimated: 10 mL/min — ABNORMAL LOW (ref >60)
GFR, Estimated: 14 mL/min — ABNORMAL LOW (ref >60)
Glucose, Bld: 103 mg/dL — ABNORMAL HIGH (ref 70–99)
Glucose, Bld: 129 mg/dL — ABNORMAL HIGH (ref 70–99)
Phosphorus: 2.8 mg/dL (ref 2.5–4.6)
Phosphorus: 2.9 mg/dL (ref 2.5–4.6)
Potassium: 4.1 mmol/L (ref 3.5–5.1)
Potassium: 3.5 mmol/L (ref 3.5–5.1)
Sodium: 133 mmol/L — ABNORMAL LOW (ref 135–145)
Sodium: 132 mmol/L — ABNORMAL LOW (ref 135–145)

## 2022-12-06 LAB — URINALYSIS, W/ REFLEX TO CULTURE (INFECTION SUSPECTED)
Glucose, UA: NEGATIVE mg/dL
Hgb urine dipstick: NEGATIVE
Ketones, ur: NEGATIVE mg/dL
Nitrite: NEGATIVE
Protein, ur: 100 mg/dL — AB
Specific Gravity, Urine: 1.021 (ref 1.005–1.030)
WBC, UA: >50 WBC/hpf (ref 0–5)
pH: 5 (ref 5.0–8.0)

## 2022-12-06 LAB — BASIC METABOLIC PANEL
Anion gap: 11 (ref 5–15)
BUN: 62 mg/dL — ABNORMAL HIGH (ref 8–23)
CO2: 23 mmol/L (ref 22–32)
Calcium: 8.4 mg/dL — ABNORMAL LOW (ref 8.9–10.3)
Chloride: 100 mmol/L (ref 98–111)
Creatinine, Ser: 4.23 mg/dL — ABNORMAL HIGH (ref 0.44–1.00)
GFR, Estimated: 10 mL/min — ABNORMAL LOW (ref >60)
Glucose, Bld: 108 mg/dL — ABNORMAL HIGH (ref 70–99)
Potassium: 4.1 mmol/L (ref 3.5–5.1)
Sodium: 134 mmol/L — ABNORMAL LOW (ref 135–145)

## 2022-12-06 LAB — PROTIME-INR
INR: 1.2 (ref 0.8–1.2)
Prothrombin Time: 15.6 seconds — ABNORMAL HIGH (ref 11.4–15.2)

## 2022-12-06 LAB — PROCALCITONIN: Procalcitonin: 2.01 ng/mL

## 2022-12-06 LAB — CBC
HCT: 28.4 % — ABNORMAL LOW (ref 36.0–46.0)
HCT: 28.1 % — ABNORMAL LOW (ref 36.0–46.0)
Hemoglobin: 8.6 g/dL — ABNORMAL LOW (ref 12.0–15.0)
Hemoglobin: 8.6 g/dL — ABNORMAL LOW (ref 12.0–15.0)
MCH: 25.9 pg — ABNORMAL LOW (ref 26.0–34.0)
MCH: 26.1 pg (ref 26.0–34.0)
MCHC: 30.3 g/dL (ref 30.0–36.0)
MCHC: 30.6 g/dL (ref 30.0–36.0)
MCV: 85.5 fL (ref 80.0–100.0)
MCV: 85.4 fL (ref 80.0–100.0)
Platelets: 352 10*3/uL (ref 150–400)
Platelets: 377 10*3/uL (ref 150–400)
RBC: 3.32 MIL/uL — ABNORMAL LOW (ref 3.87–5.11)
RBC: 3.29 MIL/uL — ABNORMAL LOW (ref 3.87–5.11)
RDW: 15.6 % — ABNORMAL HIGH (ref 11.5–15.5)
RDW: 15.7 % — ABNORMAL HIGH (ref 11.5–15.5)
WBC: 16 10*3/uL — ABNORMAL HIGH (ref 4.0–10.5)
WBC: 16.1 10*3/uL — ABNORMAL HIGH (ref 4.0–10.5)
nRBC: 3.4 % — ABNORMAL HIGH (ref 0.0–0.2)
nRBC: 2.7 % — ABNORMAL HIGH (ref 0.0–0.2)

## 2022-12-06 LAB — LACTIC ACID, PLASMA
Lactic Acid, Venous: 2.9 mmol/L (ref 0.5–1.9)
Lactic Acid, Venous: 2 mmol/L (ref 0.5–1.9)
Lactic Acid, Venous: 1.9 mmol/L (ref 0.5–1.9)
Lactic Acid, Venous: 1.7 mmol/L (ref 0.5–1.9)

## 2022-12-06 LAB — URINE DRUG SCREEN, QUALITATIVE (ARMC ONLY)
Amphetamines, Ur Screen: NOT DETECTED
Barbiturates, Ur Screen: NOT DETECTED
Benzodiazepine, Ur Scrn: NOT DETECTED
Cannabinoid 50 Ng, Ur ~~LOC~~: NOT DETECTED
Cocaine Metabolite,Ur ~~LOC~~: NOT DETECTED
MDMA (Ecstasy)Ur Screen: NOT DETECTED
Methadone Scn, Ur: NOT DETECTED
Opiate, Ur Screen: NOT DETECTED
Phencyclidine (PCP) Ur S: NOT DETECTED
Tricyclic, Ur Screen: POSITIVE — AB

## 2022-12-06 LAB — CORTISOL-AM, BLOOD: Cortisol - AM: 35.9 ug/dL — ABNORMAL HIGH (ref 6.7–22.6)

## 2022-12-06 LAB — HEPATITIS B SURFACE ANTIGEN: Hepatitis B Surface Ag: NONREACTIVE

## 2022-12-06 LAB — HEMOGLOBIN AND HEMATOCRIT, BLOOD
HCT: 26.6 % — ABNORMAL LOW (ref 36.0–46.0)
Hemoglobin: 8.2 g/dL — ABNORMAL LOW (ref 12.0–15.0)

## 2022-12-06 LAB — GLUCOSE, CAPILLARY
Glucose-Capillary: 107 mg/dL — ABNORMAL HIGH (ref 70–99)
Glucose-Capillary: 134 mg/dL — ABNORMAL HIGH (ref 70–99)

## 2022-12-06 LAB — MRSA NEXT GEN BY PCR, NASAL: MRSA by PCR Next Gen: DETECTED — AB

## 2022-12-06 MED ORDER — FAMOTIDINE 40 MG/5ML PO SUSR
40.0000 mg | Freq: Every day | ORAL | Status: DC
  Administered 2022-12-06 – 2022-12-08 (×3): 40 mg
  Filled 2022-12-06 (×4): qty 5

## 2022-12-06 MED ORDER — ACETAMINOPHEN 325 MG PO TABS
650.0000 mg | ORAL_TABLET | Freq: Four times a day (QID) | ORAL | Status: DC | PRN
  Administered 2022-12-06 – 2022-12-29 (×12): 650 mg via ORAL
  Filled 2022-12-06 (×12): qty 2

## 2022-12-06 MED ORDER — DROXIDOPA 100 MG PO CAPS
200.0000 mg | ORAL_CAPSULE | Freq: Three times a day (TID) | ORAL | Status: AC
  Administered 2022-12-06: 200 mg via ORAL
  Filled 2022-12-06: qty 2

## 2022-12-06 MED ORDER — HEPARIN SODIUM (PORCINE) 1000 UNIT/ML IJ SOLN
INTRAMUSCULAR | Status: AC
  Filled 2022-12-06: qty 10

## 2022-12-06 MED ORDER — FLORANEX PO PACK
1.0000 g | PACK | Freq: Three times a day (TID) | ORAL | Status: DC
  Administered 2022-12-06: 1 g
  Filled 2022-12-06 (×11): qty 1

## 2022-12-06 MED ORDER — RENA-VITE PO TABS
1.0000 | ORAL_TABLET | Freq: Every day | ORAL | Status: DC
  Administered 2022-12-06 – 2023-01-02 (×28): 1
  Filled 2022-12-06 (×32): qty 1

## 2022-12-06 MED ORDER — CHLORHEXIDINE GLUCONATE CLOTH 2 % EX PADS
6.0000 | MEDICATED_PAD | Freq: Every day | CUTANEOUS | Status: AC
  Administered 2022-12-07 – 2022-12-11 (×4): 6 via TOPICAL

## 2022-12-06 MED ORDER — HEPARIN SODIUM (PORCINE) 1000 UNIT/ML DIALYSIS
1000.0000 [IU] | INTRAMUSCULAR | Status: DC | PRN
  Administered 2022-12-07: 2800 [IU] via INTRAVENOUS_CENTRAL
  Filled 2022-12-06: qty 6

## 2022-12-06 MED ORDER — MUPIROCIN 2 % EX OINT
TOPICAL_OINTMENT | Freq: Two times a day (BID) | CUTANEOUS | Status: DC
  Administered 2022-12-07 – 2022-12-17 (×4): 1 via NASAL
  Filled 2022-12-06 (×2): qty 22

## 2022-12-06 MED ORDER — VITAMIN C 500 MG PO TABS
500.0000 mg | ORAL_TABLET | Freq: Two times a day (BID) | ORAL | Status: DC
  Administered 2022-12-06 – 2023-01-03 (×55): 500 mg
  Filled 2022-12-06 (×57): qty 1

## 2022-12-06 MED ORDER — CHLORHEXIDINE GLUCONATE 0.12 % MT SOLN
15.0000 mL | Freq: Two times a day (BID) | OROMUCOSAL | Status: DC
  Administered 2022-12-06 – 2023-01-03 (×40): 15 mL via OROMUCOSAL
  Filled 2022-12-06 (×39): qty 15

## 2022-12-06 MED ORDER — HEPARIN SODIUM (PORCINE) 1000 UNIT/ML DIALYSIS
1000.0000 [IU] | INTRAMUSCULAR | Status: DC | PRN
  Administered 2022-12-06: 2800 [IU] via INTRAVENOUS_CENTRAL
  Filled 2022-12-06: qty 6
  Filled 2022-12-06: qty 3

## 2022-12-06 MED ORDER — PANTOPRAZOLE SODIUM 40 MG PO PACK: 40.0000 mg | PACK | Freq: Every day | ORAL | Status: DC

## 2022-12-06 MED ORDER — ONDANSETRON HCL 4 MG PO TABS: 4.0000 mg | ORAL_TABLET | Freq: Four times a day (QID) | ORAL | Status: DC | PRN

## 2022-12-06 MED ORDER — PRISMASOL BGK 2/3.5 32-2-3.5 MEQ/L EC SOLN
Status: DC
  Filled 2022-12-06 (×9): qty 5000

## 2022-12-06 MED ORDER — SODIUM CHLORIDE 0.9 % IV SOLN: 250.0000 [IU]/h | INTRAVENOUS | Status: DC

## 2022-12-06 MED ORDER — ARTIFICIAL TEARS OPHTHALMIC OINT: 1.0000 | TOPICAL_OINTMENT | OPHTHALMIC | Status: DC | PRN

## 2022-12-06 MED ORDER — MAGNESIUM HYDROXIDE 400 MG/5ML PO SUSP: 30.0000 mL | Freq: Every day | ORAL | Status: DC | PRN

## 2022-12-06 MED ORDER — PRISMASOL BGK 2/3.5 32-2-3.5 MEQ/L EC SOLN
Status: DC
  Filled 2022-12-06 (×2): qty 5000

## 2022-12-06 MED ORDER — ENOXAPARIN SODIUM 40 MG/0.4ML IJ SOSY: 40.0000 mg | PREFILLED_SYRINGE | INTRAMUSCULAR | Status: DC

## 2022-12-06 MED ORDER — SODIUM CHLORIDE 0.9 % IV SOLN: 2.0000 g | INTRAVENOUS | Status: DC

## 2022-12-06 MED ORDER — IPRATROPIUM-ALBUTEROL 0.5-2.5 (3) MG/3ML IN SOLN: 3.0000 mL | Freq: Four times a day (QID) | RESPIRATORY_TRACT | Status: DC | PRN

## 2022-12-06 MED ORDER — LACTATED RINGERS IV SOLN
150.0000 mL/h | INTRAVENOUS | Status: DC
  Administered 2022-12-06: 150 mL/h via INTRAVENOUS

## 2022-12-06 MED ORDER — VANCOMYCIN HCL IN DEXTROSE 1-5 GM/200ML-% IV SOLN
1000.0000 mg | INTRAVENOUS | Status: DC
  Administered 2022-12-07: 1000 mg via INTRAVENOUS
  Filled 2022-12-06 (×2): qty 200

## 2022-12-06 MED ORDER — SODIUM CHLORIDE 0.9 % IV SOLN
1000.0000 [IU]/h | INTRAVENOUS | Status: DC
  Administered 2022-12-06: 500 [IU]/h via INTRAVENOUS_CENTRAL
  Administered 2022-12-07: 1000 [IU]/h via INTRAVENOUS_CENTRAL
  Filled 2022-12-06 (×2): qty 2

## 2022-12-06 MED ORDER — SENNOSIDES 8.8 MG/5ML PO SYRP: 10.0000 mL | ORAL_SOLUTION | Freq: Every evening | ORAL | Status: DC | PRN

## 2022-12-06 MED ORDER — SODIUM CHLORIDE 0.9 % IV SOLN
2.0000 g | Freq: Two times a day (BID) | INTRAVENOUS | Status: DC
  Administered 2022-12-06 – 2022-12-08 (×4): 2 g via INTRAVENOUS
  Filled 2022-12-06 (×5): qty 12.5

## 2022-12-06 MED ORDER — IPRATROPIUM-ALBUTEROL 0.5-2.5 (3) MG/3ML IN SOLN
3.0000 mL | Freq: Three times a day (TID) | RESPIRATORY_TRACT | Status: DC
  Administered 2022-12-06: 3 mL via RESPIRATORY_TRACT

## 2022-12-06 MED ORDER — HEPARIN SODIUM (PORCINE) 5000 UNIT/ML IJ SOLN
5000.0000 [IU] | Freq: Three times a day (TID) | INTRAMUSCULAR | Status: DC
  Administered 2022-12-06 – 2023-01-03 (×79): 5000 [IU] via SUBCUTANEOUS
  Filled 2022-12-06 (×80): qty 1

## 2022-12-06 MED ORDER — VITAMIN D 25 MCG (1000 UNIT) PO TABS
2000.0000 [IU] | ORAL_TABLET | Freq: Every day | ORAL | Status: DC
  Administered 2022-12-06 – 2023-01-03 (×28): 2000 [IU]
  Filled 2022-12-06 (×28): qty 2

## 2022-12-06 MED ORDER — ONDANSETRON HCL 4 MG/2ML IJ SOLN
4.0000 mg | Freq: Four times a day (QID) | INTRAMUSCULAR | Status: DC | PRN
  Administered 2022-12-11 – 2022-12-30 (×4): 4 mg via INTRAVENOUS
  Filled 2022-12-06 (×4): qty 2

## 2022-12-06 MED ORDER — ACETAMINOPHEN 650 MG RE SUPP
650.0000 mg | Freq: Four times a day (QID) | RECTAL | Status: DC | PRN
  Administered 2022-12-06: 650 mg via RECTAL
  Filled 2022-12-06: qty 1

## 2022-12-06 MED ORDER — MIDODRINE HCL 5 MG PO TABS
15.0000 mg | ORAL_TABLET | Freq: Three times a day (TID) | ORAL | Status: DC
  Administered 2022-12-06 – 2022-12-16 (×30): 15 mg
  Filled 2022-12-06 (×30): qty 3

## 2022-12-06 MED ORDER — ESCITALOPRAM OXALATE 10 MG PO TABS
10.0000 mg | ORAL_TABLET | Freq: Every day | ORAL | Status: DC
  Administered 2022-12-06 – 2023-01-03 (×28): 10 mg
  Filled 2022-12-06 (×28): qty 1

## 2022-12-06 MED ORDER — LOPERAMIDE HCL 1 MG/7.5ML PO SUSP
2.0000 mg | ORAL | Status: DC | PRN
  Administered 2022-12-11 – 2022-12-26 (×23): 2 mg
  Filled 2022-12-06 (×32): qty 15

## 2022-12-06 MED ORDER — ORAL CARE MOUTH RINSE: 15.0000 mL | OROMUCOSAL | Status: DC | PRN

## 2022-12-06 MED ORDER — SODIUM CHLORIDE 0.9 % FOR CRRT
INTRAVENOUS_CENTRAL | Status: DC | PRN
  Administered 2022-12-06: 2000 mL via INTRAVENOUS_CENTRAL
  Filled 2022-12-06: qty 1000

## 2022-12-06 MED ORDER — TRAZODONE HCL 50 MG PO TABS
25.0000 mg | ORAL_TABLET | Freq: Every evening | ORAL | Status: DC | PRN
  Administered 2022-12-07 – 2022-12-08 (×2): 25 mg via ORAL
  Filled 2022-12-06 (×2): qty 1

## 2022-12-06 MED ORDER — AMIODARONE HCL 200 MG PO TABS
200.0000 mg | ORAL_TABLET | Freq: Two times a day (BID) | ORAL | Status: DC
  Administered 2022-12-06 – 2023-01-03 (×54): 200 mg
  Filled 2022-12-06 (×56): qty 1

## 2022-12-06 MED ORDER — NOREPINEPHRINE 4 MG/250ML-% IV SOLN
0.0000 ug/min | INTRAVENOUS | Status: DC
  Administered 2022-12-06: 4 ug/min via INTRAVENOUS
  Administered 2022-12-06: 5 ug/min via INTRAVENOUS
  Administered 2022-12-07: 15 ug/min via INTRAVENOUS
  Administered 2022-12-07: 11 ug/min via INTRAVENOUS
  Filled 2022-12-06 (×4): qty 250

## 2022-12-06 MED ORDER — SODIUM CHLORIDE 0.9 % IV SOLN: INTRAVENOUS | Status: DC | PRN

## 2022-12-06 MED ORDER — CHLORHEXIDINE GLUCONATE CLOTH 2 % EX PADS
6.0000 | MEDICATED_PAD | Freq: Every day | CUTANEOUS | Status: DC
  Administered 2022-12-06 – 2022-12-07 (×3): 6 via TOPICAL

## 2022-12-06 MED ORDER — IPRATROPIUM-ALBUTEROL 0.5-2.5 (3) MG/3ML IN SOLN: 3.0000 mL | RESPIRATORY_TRACT | Status: DC | PRN

## 2022-12-06 MED ORDER — VANCOMYCIN HCL 1750 MG/350ML IV SOLN
1750.0000 mg | Freq: Once | INTRAVENOUS | Status: AC
  Administered 2022-12-06: 1750 mg via INTRAVENOUS
  Filled 2022-12-06: qty 350

## 2022-12-06 MED ORDER — HEPARIN BOLUS VIA INFUSION (CRRT): 1000.0000 [IU] | INTRAVENOUS | Status: DC | PRN

## 2022-12-06 NOTE — Consult Note (Signed)
[facility] Vascular Consult Note  MRN : [id number]  Alexandra Foster is a 72 y.o. (Jun 29, 1950) female who presents with chief complaint of  Chief Complaint  Patient presents with   Altered Mental Status    Brought in by medic from [facility] [employer]), with AMS/ lethargy. Last well, unknown., According to medic baseline unknown. Facility currently ruling out aspiration pna. History of dialysis, g-tube. Temp 102.2 when medic arrived to facility.   .  History of Present Illness: Patient is admitted to the hospital with sepsis , history of ESRD on Hd via right IG Permacath.  The patient is critically ill with sepsis. The patient requires removal of Permacth.    Current Facility-Administered Medications  Medication Dose Route Frequency Provider Last Rate Last Admin   acetaminophen (TYLENOL) tablet 650 mg  650 mg Oral Q6H PRN Mansy, Jan A, MD   650 mg at 12/06/22 0940   Or   acetaminophen (TYLENOL) suppository 650 mg  650 mg Rectal Q6H PRN Mansy, Jan A, MD   650 mg at 12/06/22 0317   amiodarone (PACERONE) tablet 200 mg  200 mg Per Tube BID Mansy, Jan A, MD   200 mg at 12/06/22 0941   artificial tears (LACRILUBE) ophthalmic ointment 1 Application  1 Application Both Eyes Q4H PRN Mansy, Jan A, MD       ascorbic acid (VITAMIN C) tablet 500 mg  500 mg Per Tube BID Mansy, Jan A, MD   500 mg at 12/06/22 0940   ceFEPIme (MAXIPIME) 2 g in sodium chloride 0.9 % 100 mL IVPB  2 g Intravenous Q12H Lowella Bandy, RPH       chlorhexidine (PERIDEX) 0.12 % solution 15 mL  15 mL Mouth Rinse BID Mansy, Jan A, MD   15 mL at 12/06/22 0429   Chlorhexidine Gluconate Cloth 2 % PADS 6 each  6 each Topical Q0600 Kolluru, Threasa Heads, MD   6 each at 12/06/22 0942   [START ON 12/07/2022] Chlorhexidine Gluconate Cloth 2 % PADS 6 each  6 each Topical Q0600 Lurene Shadow, MD       cholecalciferol (VITAMIN D3) 25 MCG (1000 UNIT) tablet 2,000 Units  2,000 Units Per Tube Daily Mansy, Jan A, MD   2,000  Units at 12/06/22 0941   escitalopram (LEXAPRO) tablet 10 mg  10 mg Per Tube Daily Mansy, Jan A, MD   10 mg at 12/06/22 0940   famotidine (PEPCID) 40 MG/5ML suspension 40 mg  40 mg Per Tube Daily Mansy, Jan A, MD   40 mg at 12/06/22 0941   heparin injection 1,000-6,000 Units  1,000-6,000 Units CRRT PRN Kolluru, Threasa Heads, MD       heparin injection 5,000 Units  5,000 Units Subcutaneous Q8H Mansy, Jan A, MD   5,000 Units at 12/06/22 8469   ipratropium-albuterol (DUONEB) 0.5-2.5 (3) MG/3ML nebulizer solution 3 mL  3 mL Nebulization Q6H PRN Lurene Shadow, MD       lactobacillus (FLORANEX/LACTINEX) granules 1 g  1 g Per Tube TID WC Mansy, Jan A, MD   1 g at 12/06/22 6295   loperamide HCl (IMODIUM) 1 MG/7.5ML solution 2 mg  2 mg Per Tube PRN Mansy, Jan A, MD       magnesium hydroxide (MILK OF MAGNESIA) suspension 30 mL  30 mL Oral Daily PRN Mansy, Jan A, MD       midodrine (PROAMATINE) tablet 15 mg  15 mg Per Tube TID WC Mansy, Vernetta Honey, MD  15 mg at 12/06/22 0941   multivitamin (RENA-VIT) tablet 1 tablet  1 tablet Per Tube QHS Mansy, Vernetta Honey, MD       mupirocin ointment (BACTROBAN) 2 %   Nasal BID Lurene Shadow, MD   Given at 12/06/22 1313   norepinephrine (LEVOPHED) 4mg  in (0.016 mg/mL) premix infusion  0-40 mcg/min Intravenous Titrated Kolluru, Sarath, MD 18.75 mL/hr at 12/06/22 1257 5 mcg/min at 12/06/22 1257   ondansetron (ZOFRAN) tablet 4 mg  4 mg Oral Q6H PRN Mansy, Jan A, MD       Or   ondansetron [facility]) injection 4 mg  4 mg Intravenous Q6H PRN Mansy, Vernetta Honey, MD       PrismaSol BGK 2/3.5 infusion   CRRT Continuous Lamont Dowdy, MD 400 mL/hr at 12/06/22 1249 New Bag at 12/06/22 1249   PrismaSol BGK 2/3.5 infusion   CRRT Continuous Lamont Dowdy, MD 400 mL/hr at 12/06/22 1249 New Bag at 12/06/22 1249   PrismaSol BGK 2/3.5 infusion   CRRT Continuous Kolluru, Threasa Heads, MD 2,000 mL/hr at 12/06/22 1249 New Bag at 12/06/22 1249   sennosides (SENOKOT) 8.8 MG/5ML syrup 10 mL  10 mL Per Tube QHS PRN  Mansy, Jan A, MD       sodium chloride 0.9 % primer fluid for CRRT   CRRT PRN Lamont Dowdy, MD       traZODone (DESYREL) tablet 25 mg  25 mg Oral QHS PRN Mansy, Vernetta Honey, MD       [START ON 12/07/2022] vancomycin (VANCOCIN) IVPB 1000 mg/200 mL premix  1,000 mg Intravenous Q24H Hallaji, Sheema M, RPH       vancomycin (VANCOREADY) IVPB 1750 mg/350 mL  1,750 mg Intravenous Once Gardner Candle, [facility]        Past Medical History:  Diagnosis Date   AKI (acute kidney injury) (HCC)    a. 04/2021 in setting of bacteremia/shock.   Arthritis    Asthma    Bacteremia    a. 04/2021 S pyogenes bacteremia in setting of lower ext cellulitis.   COPD (chronic obstructive pulmonary disease) (HCC)    Diabetes mellitus without complication (HCC)    Endometriosis    GERD (gastroesophageal reflux disease)    History of echocardiogram    a. 07/2013 Echo: EF 55-60%, impaired relaxation, mild TR; b. 04/2021 Echo: EF 50-55%, mild LVH, nl RV fxn, mild BAE, Ao sclerosis w/o stenosis.   Hypertension    Obesity    PAF (paroxysmal atrial fibrillation) (HCC)    a. 04/2021 in setting of septic shock/cellulitis.   Sleep apnea    CPAP    Past Surgical History:  Procedure Laterality Date   ABDOMINAL HYSTERECTOMY     APPLICATION OF WOUND VAC Left 05/17/2021   Procedure: APPLICATION OF WOUND VAC/WOUND VAC EXCHANGE-Matrix Myriad;  Surgeon: Carolan Shiver, MD;  Location: [facility];  Service: General;  Laterality: Left;   APPLICATION OF WOUND VAC  05/24/2021   Procedure: APPLICATION OF WOUND VAC;  Surgeon: Carolan Shiver, MD;  Location: [facility];  Service: General;;   APPLICATION OF WOUND VAC  05/10/2021   Procedure: APPLICATION OF WOUND VAC;  Surgeon: Carolan Shiver, MD;  Location: [facility];  Service: General;;   COLONOSCOPY  10/29/2006   Dr Servando Snare   COLONOSCOPY WITH PROPOFOL N/A 11/05/2016   Procedure: COLONOSCOPY WITH PROPOFOL;  Surgeon: Earline Mayotte, MD;  Location: [facility];  Service:  Endoscopy;  Laterality: N/A;   INCISION AND DRAINAGE OF WOUND Left 05/24/2021   Procedure:  IRRIGATION AND DEBRIDEMENT LEFT LEG;  Surgeon: Carolan Shiver, MD;  Location: [facility];  Service: General;  Laterality: Left;   INCISION AND DRAINAGE OF WOUND Left 05/10/2021   Procedure: IRRIGATION AND DEBRIDEMENT LEFT LEG;  Surgeon: Carolan Shiver, MD;  Location: [facility];  Service: General;  Laterality: Left;   IR FLUORO GUIDE CV LINE RIGHT  06/12/2021   IR PERC TUN PERIT CATH WO PORT S&I /IMAG  06/12/2021   IR REPLC GASTRO/COLONIC TUBE PERCUT W/FLUORO  06/12/2021   IVC FILTER INSERTION N/A 05/29/2021   Procedure: IVC FILTER INSERTION;  Surgeon: Renford Dills, MD;  Location: [facility];  Service: Cardiovascular;  Laterality: N/A;   MINOR GRAFT APPLICATION  05/24/2021   Procedure: Myriad Matrix  APPLICATION;  Surgeon: Carolan Shiver, MD;  Location: [facility];  Service: General;;   NASAL SINUS SURGERY  2002   Dr Chestine Spore   PEG PLACEMENT N/A 05/28/2021   Procedure: PERCUTANEOUS ENDOSCOPIC GASTROSTOMY (PEG) PLACEMENT;  Surgeon: Sung Amabile, DO;  Location: [facility];  Service: General;  Laterality: N/A;  TRAVEL CASE   TRACHEOSTOMY TUBE PLACEMENT N/A 05/17/2021   Procedure: TRACHEOSTOMY;  Surgeon: Geanie Logan, MD;  Location: [facility];  Service: ENT;  Laterality: N/A;   WOUND DEBRIDEMENT Left 05/07/2021   Procedure: DEBRIDEMENT WOUND;  Surgeon: Carolan Shiver, MD;  Location: [facility];  Service: General;  Laterality: Left;    Social History Social History   Tobacco Use   Smoking status: Former    Current packs/day: 0.00    Average packs/day: 2.0 packs/day for 40.0 years (80.0 ttl pk-yrs)    Types: Cigarettes    Start date: 1963    Quit date: 2003    Years since quitting: 21.6   Smokeless tobacco: Former    Types: Snuff    Quit date: 04/2001   Tobacco comments:    smoking cessation materials not required  Vaping Use   Vaping status: Never Used  Substance Use  Topics   Alcohol use: No    Alcohol/week: 0.0 standard drinks of alcohol   Drug use: No    Family History Family History  Problem Relation Age of Onset   Congestive Heart Failure Mother    Coronary artery disease Father 50   Breast cancer Sister 67   Heart disease Brother    Varicose Veins Brother    Alcohol abuse Brother     No Known Allergies   REVIEW OF SYSTEMS (Negative unless checked)  Constitutional: [] Weight loss  [] Fever  [] Chills Cardiac: [] Chest pain   [] Chest pressure   [] Palpitations   [] Shortness of breath when laying flat   [] Shortness of breath at rest   [] Shortness of breath with exertion. Vascular:  [] Pain in legs with walking   [] Pain in legs at rest   [] Pain in legs when laying flat   [] Claudication   [] Pain in feet when walking  [] Pain in feet at rest  [] Pain in feet when laying flat   [] History of DVT   [] Phlebitis   [] Swelling in legs   [] Varicose veins   [] Non-healing ulcers Pulmonary:   [] Uses home oxygen   [] Productive cough   [] Hemoptysis   [] Wheeze  [] COPD   [] Asthma Neurologic:  [] Dizziness  [] Blackouts   [] Seizures   [] History of stroke   [] History of TIA  [] Aphasia   [] Temporary blindness   [] Dysphagia   [] Weakness or numbness in arms   [] Weakness or numbness in legs Musculoskeletal:  [] Arthritis   [] Joint swelling   [] Joint  pain   [] Low back pain Hematologic:  [] Easy bruising  [] Easy bleeding   [] Hypercoagulable state   [] Anemic  [] Hepatitis Gastrointestinal:  [] Blood in stool   [] Vomiting blood  [] Gastroesophageal reflux/heartburn   [] Difficulty swallowing. Genitourinary:  [] Chronic kidney disease   [] Difficult urination  [] Frequent urination  [] Burning with urination   [] Blood in urine Skin:  [] Rashes   [] Ulcers   [] Wounds Psychological:  [] History of anxiety   []  History of major depression.  Unable to obtain the review of systems due to the patient's severe systemic illness and altered mental status.       Physical Examination  Vitals:    12/06/22 1300 12/06/22 1305 12/06/22 1310 12/06/22 1315  BP: (!) 79/49 (!) 89/51 (!) 91/52 (!) 90/48  Pulse: 64 65 65 65  Resp: (!) 31 (!) 25 (!) 26 (!) 21  Temp:      TempSrc:      SpO2: 100% 100% 98% 100%  Weight:      Height:       Body mass index is 31.94 kg/m. Gen: WD/WN Head: [place]/AT, No temporalis wasting Ear/Nose/Throat: Hearing grossly intact, nares w/o erythema or drainage Eyes: Sclera non-icteric, conjunctiva clear Neck: Supple, no nuchal rigidity.  No JVD.  Pulmonary:  decreased bilaterally Cardiac: RRR, normal S1, S2, no Murmurs, rubs or gallops. Vascular: palpable radial pulses Gastrointestinal: soft, non-tender/non-distended. No guarding/reflex.  Musculoskeletal: M/S 5/5 throughout.  Extremities without ischemic changes.  Left AKA Neurologic: somnulent Psychiatric: Difficult to assess due to the severity of patient's illness. Dermatologic: No rashes or ulcers noted.   Lymph : No Cervical, Axillary, or Inguinal lymphadenopathy.     CBC Lab Results  Component Value Date   WBC 16.1 (H) 12/06/2022   HGB 8.6 (L) 12/06/2022   HCT 28.1 (L) 12/06/2022   MCV 85.4 12/06/2022   PLT 377 12/06/2022    BMET    Component Value Date/Time   NA 134 (L) 12/06/2022 0404   NA 138 08/11/2013 0933   K 4.1 12/06/2022 0404   K 3.3 (L) 08/11/2013 0933   CL 100 12/06/2022 0404   CL 102 08/11/2013 0933   CO2 23 12/06/2022 0404   CO2 32 08/11/2013 0933   GLUCOSE 108 (H) 12/06/2022 0404   GLUCOSE 107 (H) 08/11/2013 0933   BUN 62 (H) 12/06/2022 0404   BUN 13 08/11/2013 0933   CREATININE 4.23 (H) 12/06/2022 0404   CREATININE 0.98 02/11/2021 0941   CALCIUM 8.4 (L) 12/06/2022 0404   CALCIUM 9.2 08/11/2013 0933   GFRNONAA 10 (L) 12/06/2022 0404   GFRNONAA 61 07/14/2019 0940   GFRAA 71 07/14/2019 0940   Estimated Creatinine Clearance: 12.3 mL/min (A) (by C-G formula based on SCr of 4.23 mg/dL (H)).  COAG Lab Results  Component Value Date   INR 1.2 12/06/2022   INR 1.1  12/05/2022   INR 1.2 05/28/2021    Radiology CT HEAD CODE STROKE WO CONTRAST  Result Date: 12/05/2022 CLINICAL DATA:  Code stroke.  Right facial droop and slurred speech EXAM: CT HEAD WITHOUT CONTRAST TECHNIQUE: Contiguous axial images were obtained from the base of the skull through the vertex without intravenous contrast. RADIATION DOSE REDUCTION: This exam was performed according to the departmental dose-optimization program which includes automated exposure control, adjustment of the mA and/or kV according to patient size and/or use of iterative reconstruction technique. COMPARISON:  None Available. FINDINGS: Brain: There is no mass, hemorrhage or extra-axial collection. The size and configuration of the ventricles and extra-axial CSF spaces  are normal. The brain parenchyma is normal, without evidence of acute or chronic infarction. Vascular: No abnormal hyperdensity of the major intracranial arteries or dural venous sinuses. No intracranial atherosclerosis. Skull: The visualized skull base, calvarium and extracranial soft tissues are normal. Sinuses/Orbits: No fluid levels or advanced mucosal thickening of the visualized paranasal sinuses. No mastoid or middle ear effusion. The orbits are normal. ASPECTS [facility] Stroke Program Early CT Score) - Ganglionic level infarction (caudate, lentiform nuclei, internal capsule, insula, M1-M3 cortex): 7 - Supraganglionic infarction (M4-M6 cortex): 3 Total score (0-10 with 10 being normal): 10 IMPRESSION: 1. No acute intracranial abnormality. 2. ASPECTS is 10. These results were called by telephone at the time of interpretation on 12/05/2022 at 9:39 pm to provider [facility] , who verbally acknowledged these results. Electronically Signed   By: Deatra Robinson M.D.   On: 12/05/2022 21:39   DG Chest Port 1 View  Result Date: 12/05/2022 CLINICAL DATA:  Chest pain EXAM: PORTABLE CHEST 1 VIEW COMPARISON:  06/10/2021 FINDINGS: Cardiac shadow is mildly enlarged.  Dialysis catheter is noted on the right new from the prior exam. Lungs are clear bilaterally. Mild central vascular prominence is noted likely related to volume overload. No effusion is seen. No bony abnormality is noted. IMPRESSION: Mild vascular congestion likely related to volume overload. Electronically Signed   By: Alcide Clever M.D.   On: 12/05/2022 21:21      Assessment/Plan 1. ESRD 2. Sepsis with evidence of infected Right internal jugular Permacath 3. Will remove permacath and send for culture    We will proceed with temporary dialysis catheter placement at this time.  Risks and benefits discussed with patient and/or family, and the catheter will be placed to allow immediate initiation of dialysis.  If the patient's renal function does not improve throughout the hospital course, we will be happy to place a tunneled dialysis catheter for long term use prior to discharge.     Bertram Denver, MD  12/06/2022 2:14 PM

## 2022-12-06 NOTE — Progress Notes (Signed)
Updated NP Jon Billings about low blood pressure, no new orders at this time.

## 2022-12-06 NOTE — ED Notes (Signed)
pt. admitted to the floor in room #238

## 2022-12-06 NOTE — Progress Notes (Signed)
   12/06/22 1200  Spiritual Encounters  Type of Visit Initial  Care provided to: Patient  Referral source Code page  Reason for visit Code  OnCall Visit Yes   Chaplain responded to Rapid Response Code. Patient being tended to be care team. No family present.

## 2022-12-06 NOTE — Progress Notes (Signed)
[facility]  ROUNDING NOTE   Subjective:   Alexandra Foster is a 72 y.o. female with past medical history of diabetes, COPD, GERD, hypertension. OA, a fib, and end stage renal disease on hemodialysis. Patient presents to the ED from her nursing facility complaining of altered mental status. She has been admitted for Lower urinary tract infectious disease [N39.0] Altered mental status, unspecified altered mental status type [R41.82] Sepsis due to gram-negative UTI (HCC) [A41.50, N39.0] Sepsis, due to unspecified organism, unspecified whether acute organ dysfunction present [facility]) [A41.9]  Patient is known to our practice from previous admissions and receives outpatient dialysis at [facility] on a TTS schedule, supervised by [employer]. Last treatment was completed on Thursday, per family at bedside. They state patient has been lethargic with decreased alertness. Family was concerned for UTI and requested she be brought to hospital for further evaluation. She remains very somnolent but arousable for short periods. Room air  Imaging on admission show mild vascular congestion. UA appears turbid with leukocytes.   We have been consulted to manage dialysis needs during this admission.    Objective:  Vital signs in last 24 hours:  Temp:  [99.8 F (37.7 C)-102.1 F (38.9 C)] 100.7 F (38.2 C) (08/24 0811) Pulse Rate:  [60-85] 68 (08/24 0811) Resp:  [16-30] 21 (08/24 1001) BP: (84-125)/(42-92) 94/52 (08/24 1001) SpO2:  [92 %-99 %] 96 % (08/24 0811) Weight:  [84.7 kg-86 kg] 86 kg (08/24 0309)  Weight change:  Filed Weights   12/05/22 2101 12/06/22 0309  Weight: 84.7 kg 86 kg    Intake/Output: I/O last 3 completed shifts: In: 532.6 [I.V.:532.6] Out: -    Intake/Output this shift:  No intake/output data recorded.  Physical Exam: General: Lethargic  Head: Normocephalic, atraumatic. Moist oral mucosal membranes  Eyes: Anicteric  Lungs:  Clear to  auscultation, normal effort, room air  Heart: Regular rate and rhythm  Abdomen:  Soft, nontender  Extremities:  1+ peripheral edema.  Neurologic: Somnolent   Skin: No lesions  Access: Rt permcath    Basic Metabolic Panel: Recent Labs  Lab 12/05/22 2056 12/06/22 0404  NA 128* 134*  K 4.2 4.1  CL 90* 100  CO2 22 23  GLUCOSE 197* 108*  BUN 67* 62*  CREATININE 4.53* 4.23*  CALCIUM 9.6 8.4*    Liver Function Tests: Recent Labs  Lab 12/05/22 2056  AST 66*  ALT 87*  ALKPHOS 157*  BILITOT 0.6  PROT 8.6*  ALBUMIN 3.3*   No results for input(s): "LIPASE", "AMYLASE" in the last 168 hours. No results for input(s): "AMMONIA" in the last 168 hours.  CBC: Recent Labs  Lab 12/05/22 2056 12/06/22 0404 12/06/22 0734  WBC 21.4* 16.0* 16.1*  NEUTROABS 19.4*  --   --   HGB 10.2* 8.6* 8.6*  HCT 33.4* 28.4* 28.1*  MCV 84.3 85.5 85.4  PLT 411* 352 377    Cardiac Enzymes: No results for input(s): "CKTOTAL", "CKMB", "CKMBINDEX", "TROPONINI" in the last 168 hours.  BNP: Invalid input(s): "POCBNP"  CBG: Recent Labs  Lab 12/05/22 2107  GLUCAP 178*    Microbiology: Results for orders placed or performed during the hospital encounter of 12/05/22  Culture, blood (Routine x 2)     Status: None (Preliminary result)   Collection Time: 12/05/22  8:56 PM   Specimen: BLOOD  Result Value Ref Range Status   Specimen Description BLOOD BLOOD RIGHT ARM  Final   Special Requests   Final    BOTTLES  DRAWN AEROBIC AND ANAEROBIC Blood Culture adequate volume   Culture   Final    NO GROWTH < 12 HOURS Performed at [facility], [address] [place], [place] [address]    Report Status PENDING  Incomplete  Culture, blood (Routine x 2)     Status: None (Preliminary result)   Collection Time: 12/05/22  8:57 PM   Specimen: BLOOD  Result Value Ref Range Status   Specimen Description BLOOD BLOOD LEFT ARM  Final   Special Requests   Final    BOTTLES DRAWN AEROBIC AND ANAEROBIC  Blood Culture adequate volume   Culture   Final    NO GROWTH < 12 HOURS Performed at [facility], [address], [place], [place] [address]    Report Status PENDING  Incomplete  Resp panel by RT-PCR (RSV, Flu A&B, Covid) Anterior Nasal Swab     Status: None   Collection Time: 12/05/22  9:53 PM   Specimen: Anterior Nasal Swab  Result Value Ref Range Status   SARS Coronavirus 2 by RT PCR NEGATIVE NEGATIVE Final    Comment: (NOTE) SARS-CoV-2 target nucleic acids are NOT DETECTED.  The SARS-CoV-2 RNA is generally detectable in upper respiratory specimens during the acute phase of infection. The lowest concentration of SARS-CoV-2 viral copies this assay can detect is 138 copies/mL. A negative result does not preclude SARS-Cov-2 infection and should not be used as the sole basis for treatment or other patient management decisions. A negative result may occur with  improper specimen collection/handling, submission of specimen other than nasopharyngeal swab, presence of viral mutation(s) within the areas targeted by this assay, and inadequate number of viral copies(<138 copies/mL). A negative result must be combined with clinical observations, patient history, and epidemiological information. The expected result is Negative.  Fact Sheet for Patients:  BloggerCourse.com  Fact Sheet for Healthcare Providers:  SeriousBroker.it  This test is no t yet approved or cleared by the [place] FDA and  has been authorized for detection and/or diagnosis of SARS-CoV-2 by FDA under an Emergency Use Authorization (EUA). This EUA will remain  in effect (meaning this test can be used) for the duration of the COVID-19 declaration under Section 564(b)(1) of the Act, 21 U.S.C.section 360bbb-3(b)(1), unless the authorization is terminated  or revoked sooner.       Influenza A by PCR NEGATIVE NEGATIVE Final   Influenza B by PCR NEGATIVE  NEGATIVE Final    Comment: (NOTE) The Xpert Xpress SARS-CoV-2/FLU/RSV plus assay is intended as an aid in the diagnosis of influenza from Nasopharyngeal swab specimens and should not be used as a sole basis for treatment. Nasal washings and aspirates are unacceptable for Xpert Xpress SARS-CoV-2/FLU/RSV testing.  Fact Sheet for Patients: BloggerCourse.com  Fact Sheet for Healthcare Providers: SeriousBroker.it  This test is not yet approved or cleared by the [place] FDA and has been authorized for detection and/or diagnosis of SARS-CoV-2 by FDA under an Emergency Use Authorization (EUA). This EUA will remain in effect (meaning this test can be used) for the duration of the COVID-19 declaration under Section 564(b)(1) of the Act, 21 U.S.C. section 360bbb-3(b)(1), unless the authorization is terminated or revoked.     Resp Syncytial Virus by PCR NEGATIVE NEGATIVE Final    Comment: (NOTE) Fact Sheet for Patients: BloggerCourse.com  Fact Sheet for Healthcare Providers: SeriousBroker.it  This test is not yet approved or cleared by the [place] FDA and has been authorized for detection and/or diagnosis of SARS-CoV-2 by  FDA under an Emergency Use Authorization (EUA). This EUA will remain in effect (meaning this test can be used) for the duration of the COVID-19 declaration under Section 564(b)(1) of the Act, 21 U.S.C. section 360bbb-3(b)(1), unless the authorization is terminated or revoked.  Performed at [facility], [address] [place], [place] [address]   MRSA Next Gen by PCR, Nasal     Status: Abnormal   Collection Time: 12/06/22  4:27 AM   Specimen: Nasal Mucosa; Nasal Swab  Result Value Ref Range Status   MRSA by PCR Next Gen DETECTED (A) NOT DETECTED Final    Comment: RESULT CALLED TO, READ BACK BY AND VERIFIED WITH: KRISTINE CHAMBERS AT 6578  12/06/22.PMF (NOTE) The GeneXpert MRSA Assay (FDA approved for NASAL specimens only), is one component of a comprehensive MRSA colonization surveillance program. It is not intended to diagnose MRSA infection nor to guide or monitor treatment for MRSA infections. Test performance is not FDA approved in patients less than 52 years old. Performed at [facility], [address] [place], [place] [address]     Coagulation Studies: Recent Labs    12/05/22 12/25/54 12/06/22 0404  LABPROT 14.1 15.6*  INR 1.1 1.2    Urinalysis: Recent Labs    12/05/22 2350  COLORURINE YELLOW*  LABSPEC 1.021  PHURINE 5.0  GLUCOSEU NEGATIVE  HGBUR NEGATIVE  BILIRUBINUR SMALL*  KETONESUR NEGATIVE  PROTEINUR 100*  NITRITE NEGATIVE  LEUKOCYTESUR LARGE*      Imaging: CT HEAD CODE STROKE WO CONTRAST  Result Date: 12/05/2022 CLINICAL DATA:  Code stroke.  Right facial droop and slurred speech EXAM: CT HEAD WITHOUT CONTRAST TECHNIQUE: Contiguous axial images were obtained from the base of the skull through the vertex without intravenous contrast. RADIATION DOSE REDUCTION: This exam was performed according to the departmental dose-optimization program which includes automated exposure control, adjustment of the mA and/or kV according to patient size and/or use of iterative reconstruction technique. COMPARISON:  None Available. FINDINGS: Brain: There is no mass, hemorrhage or extra-axial collection. The size and configuration of the ventricles and extra-axial CSF spaces are normal. The brain parenchyma is normal, without evidence of acute or chronic infarction. Vascular: No abnormal hyperdensity of the major intracranial arteries or dural venous sinuses. No intracranial atherosclerosis. Skull: The visualized skull base, calvarium and extracranial soft tissues are normal. Sinuses/Orbits: No fluid levels or advanced mucosal thickening of the visualized paranasal sinuses. No mastoid or middle ear effusion. The  orbits are normal. ASPECTS [facility] Stroke Program Early CT Score) - Ganglionic level infarction (caudate, lentiform nuclei, internal capsule, insula, M1-M3 cortex): 7 - Supraganglionic infarction (M4-M6 cortex): 3 Total score (0-10 with 10 being normal): 10 IMPRESSION: 1. No acute intracranial abnormality. 2. ASPECTS is 10. These results were called by telephone at the time of interpretation on 12/05/2022 at 9:39 pm to provider [facility] , who verbally acknowledged these results. Electronically Signed   By: Deatra Robinson M.D.   On: 12/05/2022 21:39   DG Chest Port 1 View  Result Date: 12/05/2022 CLINICAL DATA:  Chest pain EXAM: PORTABLE CHEST 1 VIEW COMPARISON:  06/10/2021 FINDINGS: Cardiac shadow is mildly enlarged. Dialysis catheter is noted on the right new from the prior exam. Lungs are clear bilaterally. Mild central vascular prominence is noted likely related to volume overload. No effusion is seen. No bony abnormality is noted. IMPRESSION: Mild vascular congestion likely related to volume overload. Electronically Signed   By: Alcide Clever M.D.   On: 12/05/2022 21:21  Medications:    azithromycin Stopped (12/05/22 2325)   cefTRIAXone (ROCEPHIN)  IV      amiodarone  200 mg Per Tube BID   ascorbic acid  500 mg Per Tube BID   chlorhexidine  15 mL Mouth Rinse BID   Chlorhexidine Gluconate Cloth  6 each Topical Q0600   vitamin D3  2,000 Units Per Tube Daily   escitalopram  10 mg Per Tube Daily   famotidine  40 mg Per Tube Daily   heparin injection (subcutaneous)  5,000 Units Subcutaneous Q8H   lactobacillus  1 g Per Tube TID WC   midodrine  15 mg Per Tube TID WC   multivitamin  1 tablet Per Tube QHS   acetaminophen **OR** acetaminophen, artificial tears, ipratropium-albuterol, loperamide HCl, magnesium hydroxide, ondansetron **OR** ondansetron (ZOFRAN) IV, sennosides, traZODone  Assessment/ Plan:  [name] is a 72 y.o.  female with past medical history of diabetes,  COPD, GERD, hypertension. OA, a fib, and end stage renal disease on hemodialysis. Patient presents to the ED from her nursing facility complaining of altered mental status. She has been admitted for Lower urinary tract infectious disease [N39.0] Altered mental status, unspecified altered mental status type [R41.82] Sepsis due to gram-negative UTI (HCC) [A41.50, N39.0] Sepsis, due to unspecified organism, unspecified whether acute organ dysfunction present (HCC) [A41.9]  [facility]/TTS/Rt Permcath/81.3kg  End stage renal disease on hemodialysis. Will provide scheduled dialysis treatment today, per outpatient schedule. Will attempt UF 1L as tolerated.   2. Hypotension due to sepsis. Currently prescribed Midodrine 15mg  three times a day.   3. Anemia of chronic kidney disease  Lab Results  Component Value Date   HGB 8.6 (L) 12/06/2022    Hgb just below desired range. Patient receives Mircera at outpatient clinic.   4. Secondary Hyperparathyroidism: with outpatient labs: PTH 24, phosphorus 5.1, calcium 9.5 on 11/18/22.   Lab Results  Component Value Date   CALCIUM 8.4 (L) 12/06/2022   PHOS 5.5 (H) 06/13/2021    Calcium and phosphorus within acceptable range. Patient prescribed cholecalciferol outpatient .    LOS: 0 Alexandra Foster 8/24/202410:37 AM

## 2022-12-06 NOTE — Consult Note (Signed)
NAME:  Alexandra Foster, MRN:  [id number], DOB:  16-Feb-1951, LOS: 0 ADMISSION DATE:  12/05/2022, CONSULTATION DATE:  12/06/2022 REFERRING MD:  Dr. Wynelle Link, CHIEF COMPLAINT:  Hypotension   Brief Pt Description / Synopsis:  72 y.o female with PMHx significant for ESRD on HD admitted with Acute Metabolic Encephalopathy in setting of Severe Sepsis with Septic Shock due to MRSA & Staph Epi Bacteremia (suspect from Right chest Permcath) along with UTI.  History of Present Illness:  Alexandra Foster is a 72 y.o. female with past medical history significant for asthma, COPD, type diabetes mellitus, s/p G-tube, GERD, ESRD on HD, hypertension, osteoarthritis, paroxysmal atrial fibrillation and OSA on CPAP, who presented to [facility] ED on 12/05/22 from [facility] due to altered mental status with lethargy and decreased responsiveness.  There was a concern about aspiration pneumonia at his facility as the patient had an episode of nausea and vomiting and was reportedly coughing per her sister without wheezing or dyspnea or hypoxia.  The patient's temperature was 102.2 when EMS arrived to this facility.  The patient was not responding to questions throughout transport.  EMS noted the patient having slurred speech and significant weakness per family (family had not seen her since last week).  Code stroke was called upon arrival to the ED. teleneurology evaluated the patient, was not considered a candidate for thrombolytics given unclear last known well time, along with low suspicion for stroke given significantly global symptoms and multiple metabolic derangements and sepsis contributing to encephalopathy.  ED Course: Initial Vital Signs: Temperature 101.4 F orally, pulse 78, respiratory rate 19, blood pressure 125/70, SpO2 99% on room air Significant Labs: hyponatremia 128 and hypochloremia of 90 with blood glucose of 197, BUN of 67 and creatinine 4.53 with CO2 of 22 and anion gap 16. Alk phos was 157 and  albumin 3.3, AST 66 and ALT 87 with total obtained of 8.6. Lactic acid was 2.3 and later 2.9. CBC showed leukocytosis of 21.4 with neutrophilia and anemia. Respiratory panel including influenza A, B, RSV and COVID-19 PCR came back negative. UA was positive for UTI alcohol level was less than 10. Urine drug screen was positive for tricyclic's.  Imaging Chest X-ray>>IMPRESSION: Mild vascular congestion likely related to volume overload. CT Head w/o contrast>>IMPRESSION: 1. No acute intracranial abnormality. 2. ASPECTS is 10. Medications Administered: IV Rocephin and Zithromax as well as 3 L bolus of IV normal saline   Hospitalist were asked to admit for further workup and treatment.  Nephrology consulted for hemodialysis.  Please see "significant hospital events" section below for full detailed hospital course.  Pertinent  Medical History   Past Medical History:  Diagnosis Date   AKI (acute kidney injury) (HCC)    a. 04/2021 in setting of bacteremia/shock.   Arthritis    Asthma    Bacteremia    a. 04/2021 S pyogenes bacteremia in setting of lower ext cellulitis.   COPD (chronic obstructive pulmonary disease) (HCC)    Diabetes mellitus without complication (HCC)    Endometriosis    GERD (gastroesophageal reflux disease)    History of echocardiogram    a. 07/2013 Echo: EF 55-60%, impaired relaxation, mild TR; b. 04/2021 Echo: EF 50-55%, mild LVH, nl RV fxn, mild BAE, Ao sclerosis w/o stenosis.   Hypertension    Obesity    PAF (paroxysmal atrial fibrillation) (HCC)    a. 04/2021 in setting of septic shock/cellulitis.   Sleep apnea    CPAP    Micro Data:  8/23: SARS-CoV-2/RSV/Flu PCR>>negative 8/23: Blood cultures>> 4/4 bottles with MRSA & Staph epidermidis 8/23: MRSA PCR>> positive 8/23: Urine>> 8/24: Permcath tip>>  Antimicrobials:   Anti-infectives (From admission, onward)    Start     Dose/Rate Route Frequency Ordered Stop   12/07/22 1400  vancomycin (VANCOCIN) IVPB 1000  mg/200 mL premix        1,000 mg 200 mL/hr over 60 Minutes Intravenous Every 24 hours 12/06/22 1234     12/06/22 2200  cefTRIAXone (ROCEPHIN) 2 g in sodium chloride 0.9 % 100 mL IVPB  Status:  Discontinued        2 g 200 mL/hr over 30 Minutes Intravenous Every 24 hours 12/06/22 0132 12/06/22 1223   12/06/22 1500  ceFEPIme (MAXIPIME) 2 g in sodium chloride 0.9 % 100 mL IVPB        2 g 200 mL/hr over 30 Minutes Intravenous Every 12 hours 12/06/22 1252     12/06/22 1315  vancomycin (VANCOREADY) IVPB 1750 mg/350 mL        1,750 mg 175 mL/hr over 120 Minutes Intravenous  Once 12/06/22 1224     12/05/22 2130  cefTRIAXone (ROCEPHIN) 2 g in sodium chloride 0.9 % 100 mL IVPB  Status:  Discontinued        2 g 200 mL/hr over 30 Minutes Intravenous Every 24 hours 12/05/22 2115 12/06/22 0202   12/05/22 2130  azithromycin (ZITHROMAX) 500 mg in sodium chloride 0.9 % 250 mL IVPB  Status:  Discontinued        500 mg 250 mL/hr over 60 Minutes Intravenous Every 24 hours 12/05/22 2115 12/06/22 1223        Significant Hospital Events: Including procedures, antibiotic start and stop dates in addition to other pertinent events   8/23: Presented from [facility] due to AMS, Code Stroke called due to slurred speech and weakness. Deemed not a candidate for thrombolytics due to no clear last known normal time.  Admitted by [facility] with Nephrology consultation for dialysis. 8/24: Rapid response called due to Hypotension prior to/during Hemodialysis.  Transfer to ICU for peripheral vasopressors and initiation of CRRT.  PCCM consulted to assist with vasopressors.  Blood cultures + for MRSA & Staph Epi. ID consulted. Vascular Surgery consulted, permcath removed.  Temporary HD catheter placed.  Interim History / Subjective:  -Pt admitted yesterday evening by [facility] -BP's have been soft  -Rapid response called due to hypotension during HD (SBP 60's) despite IV bolus -Transferred to ICU for initiation of peripheral  levophed and initiation of CRRT -Blood cultures with 4/4 bottles with MRSA & Staph epi ~ ABX changed from Azithromycin & Ceftriaxone to Vancomycin ~ will also add Cefepime for now given UTI until UC results given shock and critical illness -Suspect will need permcath removed at some point ~ consult ID -Updated patient's daughter via telephone, she gives consent for placement of arterial line and temporary dialysis catheter if needed.  Objective   Blood pressure 116/73, pulse 65, temperature 99 F (37.2 C), temperature source Oral, resp. rate 10, height 5\' 4"  (1.626 m), weight 84.4 kg, SpO2 100%.        Intake/Output Summary (Last 24 hours) at 12/06/2022 1224 Last data filed at 12/06/2022 1153 Gross per 24 hour  Intake 532.6 ml  Output 0 ml  Net 532.6 ml   Filed Weights   12/06/22 0309 12/06/22 1054 12/06/22 1215  Weight: 86 kg 86 kg 84.4 kg    Examination: General: Acute on chronically ill-appearing obese female, laying  in bed, on room air, no acute distress HENT: Atraumatic, normocephalic, neck supple, difficult to assess JVD due to body habitus Lungs: Diminished breath sounds throughout, even, nonlabored, normal effort Cardiovascular: Regular rate and rhythm, S1-S2, no murmurs, rubs, gallops Abdomen: Obese, soft, nontender, nondistended, no guarding or rebound tenderness, bowel sounds positive x 4 Extremities: Prior left AKA, 1+ edema to right lower extremity, no cyanosis Neuro: Lethargic, arouses easily to voice, oriented to person and place, moves extremities to command (left side slightly weaker, which was noted yesterday during stroke workup) GU: Incontinent of urine  Resolved Hospital Problem list     Assessment & Plan:   #Septic Shock PMHx: Paroxsymal Atrial Fibrillation, HTN Echocardiogram 05/16/21: LVEF 60-65%, mild LVH, Grade I DD, normal RV systolic function, mildly elevated pulmonary artery systolic pressure -Continuous cardiac monitoring -Maintain MAP  >65 -Cautious IV fluids due to ESRD -Vasopressors as needed to maintain MAP goal -Continue Midodrine -Trend lactic acid until normalized -Trend HS Troponin until peaked -Cortisol is 35.9 -Echocardiogram pending -Continue outpatient Amiodarone  #Severe Sepsis due to MRSA & Staph Epi Bacteremia (suspect due to Permcath) & UTI -Monitor fever curve -Trend WBC's & Procalcitonin -Follow cultures as above -Start empiric Cefepime & Vancomycin pending cultures & sensitivities -Obtain Echocardiogram -Right chest Permcath likely will have to be removed ~ Vascular Surgery has been consulted -Consult ID, appreciate input  #ESRD on Hemodialysis #Mild Hyponatremia -Monitor I&O's / urinary output -Follow BMP -Ensure adequate renal perfusion -Avoid nephrotoxic agents as able -Replace electrolytes as indicated ~ Pharmacy following for assistance with electrolyte replacement -Nephrology following, appreciate input: HD vs CRRT per recommendations  #COPD without acute exacerbation #OSA, uses CPAP at home -Supplemental O2 as needed to maintain O2 sats 88 to 92% -CPAP qhs -Follow intermittent Chest X-ray & ABG as needed -Bronchodilators prn -Pulmonary toilet as able  #Anemia of chronic disease -Monitor for S/Sx of bleeding -Trend CBC -Heparin SQ for VTE Prophylaxis  -Transfuse for Hgb <7  #Type 2 diabetes mellitus -CBG's q4h; Target range of 140 to 180 -SSI -Follow ICU Hypo/Hyperglycemia protocol -Hold outpatient metformin  #Acute Metabolic Encephalopathy in setting of sepsis and multiple metabolic derangements PMHx: Depression and Anxiety CT Head 8/23 negative for acute intracranial abnormality Code stroke called 8/23, deemed not a candidate for thrombolytics given unclear last known well time -Treatment of Sepsis and metabolic derangements as outlined above -Provide supportive care -Promote normal sleep/wake cycle and family presence -Avoid sedating medications as able -Hold  Klonopin, Buspar, Lexapro for now    Patient is critically ill with bacteremia and shock.  Prognosis is guarded, high risk for further decompensation, cardiac arrest and death.  Given current illness superimposed on advanced age and multiple chronic comorbidities, overall long-term prognosis is poor.  Recommend DNR status.   Best Practice (right click and "Reselect all SmartList Selections" daily)   Diet/type: NPO DVT prophylaxis: prophylactic heparin  GI prophylaxis: PPI Lines: right radial A-line, left femoral Trialysis (both still needed) Foley:  N/A Code Status:  full code Last date of multidisciplinary goals of care discussion [N/A]  8/24: Updated patient's daughter Earley Abide via telephone on plan of care.  She gives consent for arterial line and temporary dialysis catheter if needed.  Labs   CBC: Recent Labs  Lab 12/05/22 2056 12/06/22 0404 12/06/22 0734  WBC 21.4* 16.0* 16.1*  NEUTROABS 19.4*  --   --   HGB 10.2* 8.6* 8.6*  HCT 33.4* 28.4* 28.1*  MCV 84.3 85.5 85.4  PLT 411* 352 377  Basic Metabolic Panel: Recent Labs  Lab 12/05/22 2056 12/06/22 0404  NA 128* 134*  K 4.2 4.1  CL 90* 100  CO2 22 23  GLUCOSE 197* 108*  BUN 67* 62*  CREATININE 4.53* 4.23*  CALCIUM 9.6 8.4*   GFR: Estimated Creatinine Clearance: 12.3 mL/min (A) (by C-G formula based on SCr of 4.23 mg/dL (H)). Recent Labs  Lab 12/05/22 2056 12/06/22 0016 12/06/22 0404 12/06/22 0734 12/06/22 0735  PROCALCITON  --   --  2.01  --   --   WBC 21.4*  --  16.0* 16.1*  --   LATICACIDVEN 2.3* 2.9* 2.0*  --  1.9    Liver Function Tests: Recent Labs  Lab 12/05/22 2056  AST 66*  ALT 87*  ALKPHOS 157*  BILITOT 0.6  PROT 8.6*  ALBUMIN 3.3*   No results for input(s): "LIPASE", "AMYLASE" in the last 168 hours. No results for input(s): "AMMONIA" in the last 168 hours.  ABG    Component Value Date/Time   PHART 7.40 05/14/2021 0341   PCO2ART 45 05/14/2021 0341   PO2ART 129 (H)  05/14/2021 0341   HCO3 27.9 05/14/2021 0341   ACIDBASEDEF 12.8 (H) 05/11/2021 0603   O2SAT 98.9 05/14/2021 0341     Coagulation Profile: Recent Labs  Lab 12/05/22 2056 12/06/22 0404  INR 1.1 1.2    Cardiac Enzymes: No results for input(s): "CKTOTAL", "CKMB", "CKMBINDEX", "TROPONINI" in the last 168 hours.  HbA1C: Hgb A1c MFr Bld  Date/Time Value Ref Range Status  05/05/2021 04:53 AM 6.7 (H) 4.8 - 5.6 % Final    Comment:    (NOTE)         Prediabetes: 5.7 - 6.4         Diabetes: >6.4         Glycemic control for adults with diabetes: <7.0   02/11/2021 09:41 AM 6.3 (H) <5.7 % of total Hgb Final    Comment:    For someone without known diabetes, a hemoglobin  A1c value between 5.7% and 6.4% is consistent with prediabetes and should be confirmed with a  follow-up test. . For someone with known diabetes, a value <7% indicates that their diabetes is well controlled. A1c targets should be individualized based on duration of diabetes, age, comorbid conditions, and other considerations. . This assay result is consistent with an increased risk of diabetes. . Currently, no consensus exists regarding use of hemoglobin A1c for diagnosis of diabetes for children. .     CBG: Recent Labs  Lab 12/05/22 2107  GLUCAP 178*    Review of Systems:   Unable to assess due to AMS  Past Medical History:  She,  has a past medical history of AKI (acute kidney injury) (HCC), Arthritis, Asthma, Bacteremia, COPD (chronic obstructive pulmonary disease) (HCC), Diabetes mellitus without complication (HCC), Endometriosis, GERD (gastroesophageal reflux disease), History of echocardiogram, Hypertension, Obesity, PAF (paroxysmal atrial fibrillation) (HCC), and Sleep apnea.   Surgical History:   Past Surgical History:  Procedure Laterality Date   ABDOMINAL HYSTERECTOMY     APPLICATION OF WOUND VAC Left 05/17/2021   Procedure: APPLICATION OF WOUND VAC/WOUND VAC EXCHANGE-Matrix Myriad;   Surgeon: Carolan Shiver, MD;  Location: [facility] ORS;  Service: General;  Laterality: Left;   APPLICATION OF WOUND VAC  05/24/2021   Procedure: APPLICATION OF WOUND VAC;  Surgeon: Carolan Shiver, MD;  Location: [facility] ORS;  Service: General;;   APPLICATION OF WOUND VAC  05/10/2021   Procedure: APPLICATION OF WOUND VAC;  Surgeon: Carolan Shiver,  MD;  Location: [facility] ORS;  Service: General;;   COLONOSCOPY  10/29/2006   Dr Servando Snare   COLONOSCOPY WITH PROPOFOL N/A 11/05/2016   Procedure: COLONOSCOPY WITH PROPOFOL;  Surgeon: Earline Mayotte, MD;  Location: [facility];  Service: Endoscopy;  Laterality: N/A;   INCISION AND DRAINAGE OF WOUND Left 05/24/2021   Procedure: IRRIGATION AND DEBRIDEMENT LEFT LEG;  Surgeon: Carolan Shiver, MD;  Location: [facility] ORS;  Service: General;  Laterality: Left;   INCISION AND DRAINAGE OF WOUND Left 05/10/2021   Procedure: IRRIGATION AND DEBRIDEMENT LEFT LEG;  Surgeon: Carolan Shiver, MD;  Location: [facility] ORS;  Service: General;  Laterality: Left;   IR FLUORO GUIDE CV LINE RIGHT  06/12/2021   IR PERC TUN PERIT CATH WO PORT S&I /IMAG  06/12/2021   IR REPLC GASTRO/COLONIC TUBE PERCUT W/FLUORO  06/12/2021   IVC FILTER INSERTION N/A 05/29/2021   Procedure: IVC FILTER INSERTION;  Surgeon: Renford Dills, MD;  Location: [facility] INVASIVE CV LAB;  Service: Cardiovascular;  Laterality: N/A;   MINOR GRAFT APPLICATION  05/24/2021   Procedure: Myriad Matrix  APPLICATION;  Surgeon: Carolan Shiver, MD;  Location: [facility] ORS;  Service: General;;   NASAL SINUS SURGERY  2002   Dr Chestine Spore   PEG PLACEMENT N/A 05/28/2021   Procedure: PERCUTANEOUS ENDOSCOPIC GASTROSTOMY (PEG) PLACEMENT;  Surgeon: Sung Amabile, DO;  Location: [facility] ENDOSCOPY;  Service: General;  Laterality: N/A;  TRAVEL CASE   TRACHEOSTOMY TUBE PLACEMENT N/A 05/17/2021   Procedure: TRACHEOSTOMY;  Surgeon: Geanie Logan, MD;  Location: [facility] ORS;  Service: ENT;  Laterality: N/A;   WOUND DEBRIDEMENT Left  05/07/2021   Procedure: DEBRIDEMENT WOUND;  Surgeon: Carolan Shiver, MD;  Location: [facility] ORS;  Service: General;  Laterality: Left;     Social History:   reports that she quit smoking about 21 years ago. Her smoking use included cigarettes. She started smoking about 61 years ago. She has a 80 pack-year smoking history. She quit smokeless tobacco use about 21 years ago.  Her smokeless tobacco use included snuff. She reports that she does not drink alcohol and does not use drugs.   Family History:  Her family history includes Alcohol abuse in her brother; Breast cancer (age of onset: 106) in her sister; Congestive Heart Failure in her mother; Coronary artery disease (age of onset: 50) in her father; Heart disease in her brother; Varicose Veins in her brother.   Allergies No Known Allergies   Home Medications  Prior to Admission medications   Medication Sig Start Date End Date Taking? Authorizing Provider  amiodarone (PACERONE) 200 MG tablet Place 1 tablet (200 mg total) into feeding tube 2 (two) times daily. 06/13/21  Yes Erin Fulling, MD  ascorbic acid (VITAMIN C) 500 MG tablet Place 1 tablet (500 mg total) into feeding tube 2 (two) times daily. 06/13/21  Yes Kasa, Wallis Bamberg, MD  busPIRone (BUSPAR) 5 MG tablet Take 5 mg by mouth 2 (two) times daily. 12/04/22  Yes [provider]  cholecalciferol (VITAMIN D) 25 MCG tablet Place 2 tablets (2,000 Units total) into feeding tube daily. 06/14/21  Yes Kasa, Wallis Bamberg, MD  clonazePAM (KLONOPIN) 0.5 MG tablet Take 0.25-0.5 mg by mouth 2 (two) times daily. 0.25 mg every morning and 0.5 mg at bedtime for Agitation 11/18/22  Yes [provider]  escitalopram (LEXAPRO) 10 MG tablet Place 1 tablet (10 mg total) into feeding tube daily. 06/14/21  Yes Erin Fulling, MD  hydrOXYzine (ATARAX) 50 MG tablet Take 100 mg by mouth 2 (two) times  daily. 12/02/22  Yes [provider]  lip balm (BLISTEX) OINT Apply 1 application topically 3 (three) times  daily. 06/13/21  Yes Erin Fulling, MD  loperamide (IMODIUM) 2 MG capsule Take 1 capsule (2 mg total) by mouth as needed for diarrhea or loose stools. 06/13/21  Yes Erin Fulling, MD  midodrine (PROAMATINE) 5 MG tablet Place 3 tablets (15 mg total) into feeding tube 3 (three) times daily with meals. 06/13/21  Yes Erin Fulling, MD  multivitamin (RENA-VIT) TABS tablet Place 1 tablet into feeding tube at bedtime. 06/13/21  Yes Erin Fulling, MD  omeprazole (PRILOSEC) 20 MG capsule Take 20 mg by mouth daily. 11/12/22  Yes [provider]  sennosides (SENOKOT) 8.8 MG/5ML syrup Place 10 mLs into feeding tube at bedtime as needed for moderate constipation. 06/13/21  Yes Erin Fulling, MD  artificial tears (LACRILUBE) OINT ophthalmic ointment Place into both eyes every 4 (four) hours as needed for dry eyes. Patient not taking: Reported on 12/06/2022 06/13/21   Erin Fulling, MD  chlorhexidine gluconate, MEDLINE KIT, (PERIDEX) 0.12 % solution 15 mLs by Mouth Rinse route 2 (two) times daily. Patient not taking: Reported on 12/06/2022 06/13/21   Erin Fulling, MD  collagenase (SANTYL) ointment Apply topically daily. Patient not taking: Reported on 12/06/2022 06/14/21   Erin Fulling, MD  dextrose 5 % SOLN 100 mL with copper chloride 0.4 MG/ML SOLN 2 mg Inject 2 mg into the vein daily. 06/14/21   Erin Fulling, MD  ipratropium-albuterol (DUONEB) 0.5-2.5 (3) MG/3ML SOLN Take 3 mLs by nebulization 3 (three) times daily. Patient not taking: Reported on 12/06/2022 06/13/21   Erin Fulling, MD  lactobacillus (FLORANEX/LACTINEX) PACK Place 1 packet (1 g total) into feeding tube 3 (three) times daily with meals. Patient not taking: Reported on 12/06/2022 06/13/21   Erin Fulling, MD  pantoprazole sodium (PROTONIX) 40 mg Place 40 mg into feeding tube daily. Patient not taking: Reported on 12/06/2022 06/13/21   Erin Fulling, MD     Critical care time: 55 minutes     Harlon Ditty, AGACNP-BC [facility] Prefer epic  messenger for cross cover needs If after hours, please call E-link

## 2022-12-06 NOTE — Assessment & Plan Note (Addendum)
Continue dialysis as per nephrology

## 2022-12-06 NOTE — Progress Notes (Signed)
PHARMACY - PHYSICIAN COMMUNICATION CRITICAL VALUE ALERT - BLOOD CULTURE IDENTIFICATION (BCID)  Alexandra Foster is an 72 y.o. female who presented to [facility] on 12/05/2022 with a chief complaint of AMS  Assessment:  Lab called about positive blood cultures for this patient. Bcx 4 of 4 GPC. BCID showing Staph Epi and MRSA. Patient is currently on ceftriaxone and azithromycin for sepsis   Name of physician (or Provider) Contacted: Dr Myriam Forehand  Current antibiotics: ceftriaxone and azithromycin   Changes to prescribed antibiotics recommended:  Recommended starting vancomycin IV  Recommendations accepted by provider  Results for orders placed or performed during the hospital encounter of 12/05/22  Blood Culture ID Panel (Reflexed) (Collected: 12/05/2022  8:57 PM)  Result Value Ref Range   Enterococcus faecalis NOT DETECTED NOT DETECTED   Enterococcus Faecium NOT DETECTED NOT DETECTED   Listeria monocytogenes NOT DETECTED NOT DETECTED   Staphylococcus species DETECTED (A) NOT DETECTED   Staphylococcus aureus (BCID) DETECTED (A) NOT DETECTED   Staphylococcus epidermidis DETECTED (A) NOT DETECTED   Staphylococcus lugdunensis NOT DETECTED NOT DETECTED   Streptococcus species NOT DETECTED NOT DETECTED   Streptococcus agalactiae NOT DETECTED NOT DETECTED   Streptococcus pneumoniae NOT DETECTED NOT DETECTED   Streptococcus pyogenes NOT DETECTED NOT DETECTED   A.calcoaceticus-baumannii NOT DETECTED NOT DETECTED   Bacteroides fragilis NOT DETECTED NOT DETECTED   Enterobacterales NOT DETECTED NOT DETECTED   Enterobacter cloacae complex NOT DETECTED NOT DETECTED   Escherichia coli NOT DETECTED NOT DETECTED   Klebsiella aerogenes NOT DETECTED NOT DETECTED   Klebsiella oxytoca NOT DETECTED NOT DETECTED   Klebsiella pneumoniae NOT DETECTED NOT DETECTED   Proteus species NOT DETECTED NOT DETECTED   Salmonella species NOT DETECTED NOT DETECTED   Serratia marcescens NOT DETECTED NOT DETECTED    Haemophilus influenzae NOT DETECTED NOT DETECTED   Neisseria meningitidis NOT DETECTED NOT DETECTED   Pseudomonas aeruginosa NOT DETECTED NOT DETECTED   Stenotrophomonas maltophilia NOT DETECTED NOT DETECTED   Candida albicans NOT DETECTED NOT DETECTED   Candida auris NOT DETECTED NOT DETECTED   Candida glabrata NOT DETECTED NOT DETECTED   Candida krusei NOT DETECTED NOT DETECTED   Candida parapsilosis NOT DETECTED NOT DETECTED   Candida tropicalis NOT DETECTED NOT DETECTED   Cryptococcus neoformans/gattii NOT DETECTED NOT DETECTED   Methicillin resistance mecA/C DETECTED (A) NOT DETECTED   Meth resistant mecA/C and MREJ DETECTED (A) NOT DETECTED    Gardner Candle, PharmD, BCPS Clinical Pharmacist 12/06/2022 12:21 PM

## 2022-12-06 NOTE — Assessment & Plan Note (Addendum)
MRSA bacteremia, Staph epidermidis bacteremia, Enterococcus faecalis UTI, aspiration pneumonia bilateral lower lobe.  Patient on daptomycin through 921 with dialysis.[name]

## 2022-12-06 NOTE — Assessment & Plan Note (Addendum)
Continue Klonopin BuSpar and Lexapro.

## 2022-12-06 NOTE — Procedures (Signed)
Arterial Catheter Insertion Procedure Note  Alexandra Foster  [id number]  1950-09-23  Date:12/06/22  Time:1:46 PM    Provider Performing: Judithe Modest    Procedure: Insertion of Arterial Line (78295) with US guidance (62130)   Indication(s) Blood pressure monitoring and/or need for frequent ABGs  Consent Risks of the procedure as well as the alternatives and risks of each were explained to the patient and/or caregiver.  Consent for the procedure was obtained and is signed in the bedside chart  Anesthesia None   Time Out Verified patient identification, verified procedure, site/side was marked, verified correct patient position, special equipment/implants available, medications/allergies/relevant history reviewed, required imaging and test results available.   Sterile Technique Maximal sterile technique including full sterile barrier drape, hand hygiene, sterile gown, sterile gloves, mask, hair covering, sterile ultrasound probe cover (if used).   Procedure Description Area of catheter insertion was cleaned with chlorhexidine and draped in sterile fashion. With real-time ultrasound guidance an arterial catheter was placed into the right radial artery.  Appropriate arterial tracings confirmed on monitor.     Complications/Tolerance None; patient tolerated the procedure well.   EBL Minimal   Specimen(s) None   Harlon Ditty, AGACNP-BC [facility] Prefer epic messenger for cross cover needs If after hours, please call E-link

## 2022-12-06 NOTE — Assessment & Plan Note (Addendum)
Continue amiodarone. 

## 2022-12-06 NOTE — Progress Notes (Signed)
Progress Note    Alexandra Foster  SWF:[id number] DOB: January 18, 1951  DOA: 12/05/2022 PCP: Patrecia Pour, MD      Brief Narrative:    Medical records reviewed and are as summarized below:  Alexandra Foster is a 72 y.o. female with medical history significant for asthma, COPD, type diabetes mellitus, s/p G-tube, GERD, ESRD on HD, hypertension, osteoarthritis, paroxysmal atrial fibrillation and OSA on CPAP, who presented to the emergency room with acute onset of altered mental status with lethargy and decreased responsiveness.  There was a concern about aspiration pneumonia at his facility as the patient had an episode of nausea and vomiting and was reportedly coughing per her sister. Her temperature was 102.2 F when EMS arrived at the facility.  Code stroke was activated in the ED because of concern for stroke.  She was evaluated by teleneurologist who felt that clinical presentation was not consistent with acute stroke.  She was admitted to the hospital for severe sepsis.  Initially, this was thought to be due to acute UTI.  Workup revealed MRSA bacteremia (4 out of 4 blood culture bottles were positive).  She developed hypotension which was thought to be due to septic shock and was transferred to the ICU for further management.     Assessment/Plan:   Principal Problem:   Septic shock (HCC) Active Problems:   MRSA bacteremia   Acute metabolic encephalopathy   Paroxysmal atrial fibrillation (HCC)   Type 2 diabetes mellitus with chronic kidney disease, with long-term current use of insulin (HCC)   ESRD on hemodialysis (HCC)   Anxiety and depression    Body mass index is 31.94 kg/m.  (Obesity)   Septic shock secondary to MRSA bacteremia: Discontinue IV ceftriaxone and azithromycin.  Start IV vancomycin.  IV cefepime has been ordered as well.  2D echo has been ordered for further evaluation.  She has also been started on IV Levophed and midodrine. ID  recommended that hemodialysis catheter be removed. Case was discussed with Dr. Daiva Eves (ID specialist), Dr. Wynelle Link (nephrologist), Dr. Evie Lacks (vascular surgeon) and Dr. Belia Heman (intensivist) via secure chat.     Acute metabolic encephalopathy: Patient is still drowsy/lethargic.[name]  Continue supportive care   ESRD on hemodialysis: Hemodialysis was prematurely terminated today because of hypotension.  Follow-up with nephrologist for hemodialysis.   Other comorbidities include paroxysmal atrial fibrillation, type II DM, anxiety, depression    Diet Order             Diet Heart Room service appropriate? Yes; Fluid consistency: Thin  Diet effective now                            Consultants: Nephrologist Intensivist Tele neurologist  Procedures: Left femoral line central line placement on 12/06/2022 Arterial line placement on 12/06/2022    Medications:    amiodarone  200 mg Per Tube BID   ascorbic acid  500 mg Per Tube BID   chlorhexidine  15 mL Mouth Rinse BID   Chlorhexidine Gluconate Cloth  6 each Topical Q0600   [START ON 12/07/2022] Chlorhexidine Gluconate Cloth  6 each Topical Q0600   vitamin D3  2,000 Units Per Tube Daily   escitalopram  10 mg Per Tube Daily   famotidine  40 mg Per Tube Daily   heparin injection (subcutaneous)  5,000 Units Subcutaneous Q8H   lactobacillus  1 g Per Tube TID WC   midodrine  15 mg Per  Tube TID WC   multivitamin  1 tablet Per Tube QHS   mupirocin ointment   Nasal BID   Continuous Infusions:  ceFEPime (MAXIPIME) IV     norepinephrine (LEVOPHED) Adult infusion 5 mcg/min (12/06/22 1257)   PrismaSol BGK 2/3.5 400 mL/hr at 12/06/22 1249   PrismaSol BGK 2/3.5 400 mL/hr at 12/06/22 1249   PrismaSol BGK 2/3.5 2,000 mL/hr at 12/06/22 1249   [START ON 12/07/2022] vancomycin     vancomycin 1,750 mg (12/06/22 1521)     Anti-infectives (From admission, onward)    Start     Dose/Rate Route Frequency Ordered Stop   12/07/22 1400   vancomycin (VANCOCIN) IVPB 1000 mg/200 mL premix        1,000 mg 200 mL/hr over 60 Minutes Intravenous Every 24 hours 12/06/22 1234     12/06/22 2200  cefTRIAXone (ROCEPHIN) 2 g in sodium chloride 0.9 % 100 mL IVPB  Status:  Discontinued        2 g 200 mL/hr over 30 Minutes Intravenous Every 24 hours 12/06/22 0132 12/06/22 1223   12/06/22 1500  ceFEPIme (MAXIPIME) 2 g in sodium chloride 0.9 % 100 mL IVPB        2 g 200 mL/hr over 30 Minutes Intravenous Every 12 hours 12/06/22 1252     12/06/22 1315  vancomycin (VANCOREADY) IVPB 1750 mg/350 mL        1,750 mg 175 mL/hr over 120 Minutes Intravenous  Once 12/06/22 1224     12/05/22 2130  cefTRIAXone (ROCEPHIN) 2 g in sodium chloride 0.9 % 100 mL IVPB  Status:  Discontinued        2 g 200 mL/hr over 30 Minutes Intravenous Every 24 hours 12/05/22 2115 12/06/22 0202   12/05/22 2130  azithromycin (ZITHROMAX) 500 mg in sodium chloride 0.9 % 250 mL IVPB  Status:  Discontinued        500 mg 250 mL/hr over 60 Minutes Intravenous Every 24 hours 12/05/22 2115 12/06/22 1223              Family Communication/Anticipated D/C date and plan/Code Status   DVT prophylaxis: heparin injection 5,000 Units Start: 12/06/22 0600     Code Status: Full Code  Family Communication: Plan discussed with Steward Drone and Earley Abide (2 sisters) at the bedside Disposition Plan: Plan to discharge to SNF   Status is: Inpatient Remains inpatient appropriate because: Septic shock       Subjective:   Interval events noted.  She is lethargic/confused and unable to provide any history.  Sisters, Mansura and Minto, were at the bedside  Objective:    Vitals:   12/06/22 1300 12/06/22 1305 12/06/22 1310 12/06/22 1315  BP: (!) 79/49 (!) 89/51 (!) 91/52 (!) 90/48  Pulse: 64 65 65 65  Resp: (!) 31 (!) 25 (!) 26 (!) 21  Temp:      TempSrc:      SpO2: 100% 100% 98% 100%  Weight:      Height:       No data found.   Intake/Output Summary (Last 24 hours) at  12/06/2022 1522 Last data filed at 12/06/2022 1153 Gross per 24 hour  Intake 532.6 ml  Output 0 ml  Net 532.6 ml   Filed Weights   12/06/22 0309 12/06/22 1054 12/06/22 1215  Weight: 86 kg 86 kg 84.4 kg    Exam:   GEN: NAD SKIN: Warm and dry EYES: No pallor or icterus ENT: MMM CV: RRR PULM: CTA B ABD: soft, obese,  NT, +BS CNS: Drowsy, oriented x 2, non focal EXT: No edema or tenderness       Data Reviewed:   I have personally reviewed following labs and imaging studies:  Labs: Labs show the following:   Basic Metabolic Panel: Recent Labs  Lab 12/05/22 2056 12/06/22 0354 12/06/22 0404  NA 128* 133* 134*  K 4.2 4.1 4.1  CL 90* 99 100  CO2 22 19* 23  GLUCOSE 197* 103* 108*  BUN 67* 66* 62*  CREATININE 4.53* 4.34* 4.23*  CALCIUM 9.6 8.7* 8.4*  PHOS  --  2.8  --    GFR Estimated Creatinine Clearance: 12.3 mL/min (A) (by C-G formula based on SCr of 4.23 mg/dL (H)). Liver Function Tests: Recent Labs  Lab 12/05/22 2056 12/06/22 0354  AST 66*  --   ALT 87*  --   ALKPHOS 157*  --   BILITOT 0.6  --   PROT 8.6*  --   ALBUMIN 3.3* 2.5*   No results for input(s): "LIPASE", "AMYLASE" in the last 168 hours. No results for input(s): "AMMONIA" in the last 168 hours. Coagulation profile Recent Labs  Lab 12/05/22 2056 12/06/22 0404  INR 1.1 1.2    CBC: Recent Labs  Lab 12/05/22 2056 12/06/22 0404 12/06/22 0734  WBC 21.4* 16.0* 16.1*  NEUTROABS 19.4*  --   --   HGB 10.2* 8.6* 8.6*  HCT 33.4* 28.4* 28.1*  MCV 84.3 85.5 85.4  PLT 411* 352 377   Cardiac Enzymes: No results for input(s): "CKTOTAL", "CKMB", "CKMBINDEX", "TROPONINI" in the last 168 hours. BNP (last 3 results) No results for input(s): "PROBNP" in the last 8760 hours. CBG: Recent Labs  Lab 12/05/22 2107  GLUCAP 178*   D-Dimer: No results for input(s): "DDIMER" in the last 72 hours. Hgb A1c: No results for input(s): "HGBA1C" in the last 72 hours. Lipid Profile: No results for  input(s): "CHOL", "HDL", "LDLCALC", "TRIG", "CHOLHDL", "LDLDIRECT" in the last 72 hours. Thyroid function studies: No results for input(s): "TSH", "T4TOTAL", "T3FREE", "THYROIDAB" in the last 72 hours.  Invalid input(s): "FREET3" Anemia work up: No results for input(s): "VITAMINB12", "FOLATE", "FERRITIN", "TIBC", "IRON", "RETICCTPCT" in the last 72 hours. Sepsis Labs: Recent Labs  Lab 12/05/22 2056 12/06/22 0016 12/06/22 0404 12/06/22 0734 12/06/22 0735 12/06/22 1257  PROCALCITON  --   --  2.01  --   --   --   WBC 21.4*  --  16.0* 16.1*  --   --   LATICACIDVEN 2.3* 2.9* 2.0*  --  1.9 1.7    Microbiology Recent Results (from the past 240 hour(s))  Culture, blood (Routine x 2)     Status: None (Preliminary result)   Collection Time: 12/05/22  8:56 PM   Specimen: BLOOD  Result Value Ref Range Status   Specimen Description BLOOD BLOOD RIGHT ARM  Final   Special Requests   Final    BOTTLES DRAWN AEROBIC AND ANAEROBIC Blood Culture adequate volume   Culture  Setup Time   Final    GRAM POSITIVE COCCI IN BOTH AEROBIC AND ANAEROBIC BOTTLES CRITICAL RESULT CALLED TO, READ BACK BY AND VERIFIED WITH: SHEEMA HALLLAJI AT 1206 12/06/22.PMF Performed at [facility], [address] [place], [place] [address]    Culture [facility] POSITIVE COCCI  Final   Report Status PENDING  Incomplete  Culture, blood (Routine x 2)     Status: None (Preliminary result)   Collection Time: 12/05/22  8:57 PM   Specimen: BLOOD  Result Value Ref  Range Status   Specimen Description BLOOD BLOOD LEFT ARM  Final   Special Requests   Final    BOTTLES DRAWN AEROBIC AND ANAEROBIC Blood Culture adequate volume   Culture  Setup Time   Final    Organism ID to follow GRAM POSITIVE COCCI IN BOTH AEROBIC AND ANAEROBIC BOTTLES CRITICAL RESULT CALLED TO, READ BACK BY AND VERIFIED WITH: SHEEMA HALLAJI AT 1206 12/06/22.PMF Performed at [facility], [address] [place], [place] [address]    Culture  [facility] POSITIVE COCCI  Final   Report Status PENDING  Incomplete  Blood Culture ID Panel (Reflexed)     Status: Abnormal   Collection Time: 12/05/22  8:57 PM  Result Value Ref Range Status   Enterococcus faecalis NOT DETECTED NOT DETECTED Final   Enterococcus Faecium NOT DETECTED NOT DETECTED Final   Listeria monocytogenes NOT DETECTED NOT DETECTED Final   Staphylococcus species DETECTED (A) NOT DETECTED Final    Comment: CRITICAL RESULT CALLED TO, READ BACK BY AND VERIFIED WITH: SHEEMA HALLAJI AT 1206 12/06/22.PMF    Staphylococcus aureus (BCID) DETECTED (A) NOT DETECTED Final    Comment: Methicillin (oxacillin)-resistant Staphylococcus aureus (MRSA). MRSA is predictably resistant to beta-lactam antibiotics (except ceftaroline). Preferred therapy is vancomycin unless clinically contraindicated. Patient requires contact precautions if  hospitalized. CRITICAL RESULT CALLED TO, READ BACK BY AND VERIFIED WITH: SHEEMA HALLAJI AT 1206 12/06/22.PMF    Staphylococcus epidermidis DETECTED (A) NOT DETECTED Final    Comment: CRITICAL RESULT CALLED TO, READ BACK BY AND VERIFIED WITH: SHEEMA HALLAJI AT 1206 12/06/22.PMF    Staphylococcus lugdunensis NOT DETECTED NOT DETECTED Final   Streptococcus species NOT DETECTED NOT DETECTED Final   Streptococcus agalactiae NOT DETECTED NOT DETECTED Final   Streptococcus pneumoniae NOT DETECTED NOT DETECTED Final   Streptococcus pyogenes NOT DETECTED NOT DETECTED Final   A.calcoaceticus-baumannii NOT DETECTED NOT DETECTED Final   Bacteroides fragilis NOT DETECTED NOT DETECTED Final   Enterobacterales NOT DETECTED NOT DETECTED Final   Enterobacter cloacae complex NOT DETECTED NOT DETECTED Final   Escherichia coli NOT DETECTED NOT DETECTED Final   Klebsiella aerogenes NOT DETECTED NOT DETECTED Final   Klebsiella oxytoca NOT DETECTED NOT DETECTED Final   Klebsiella pneumoniae NOT DETECTED NOT DETECTED Final   Proteus species NOT DETECTED NOT DETECTED Final    Salmonella species NOT DETECTED NOT DETECTED Final   Serratia marcescens NOT DETECTED NOT DETECTED Final   Haemophilus influenzae NOT DETECTED NOT DETECTED Final   Neisseria meningitidis NOT DETECTED NOT DETECTED Final   Pseudomonas aeruginosa NOT DETECTED NOT DETECTED Final   Stenotrophomonas maltophilia NOT DETECTED NOT DETECTED Final   Candida albicans NOT DETECTED NOT DETECTED Final   Candida auris NOT DETECTED NOT DETECTED Final   Candida glabrata NOT DETECTED NOT DETECTED Final   Candida krusei NOT DETECTED NOT DETECTED Final   Candida parapsilosis NOT DETECTED NOT DETECTED Final   Candida tropicalis NOT DETECTED NOT DETECTED Final   Cryptococcus neoformans/gattii NOT DETECTED NOT DETECTED Final   Methicillin resistance mecA/C DETECTED (A) NOT DETECTED Final    Comment: CRITICAL RESULT CALLED TO, READ BACK BY AND VERIFIED WITH: SHEEMA HALLAJI AT 1206 12/06/22.PMF    Meth resistant mecA/C and MREJ DETECTED (A) NOT DETECTED Final    Comment: CRITICAL RESULT CALLED TO, READ BACK BY AND VERIFIED WITH: SHEEMA HALLAJI AT 1206 12/06/22.PMF Performed at [facility], [address] [place], [place] [address]   Resp panel by RT-PCR (RSV, Flu A&B, Covid) Anterior Nasal Swab  Status: None   Collection Time: 12/05/22  9:53 PM   Specimen: Anterior Nasal Swab  Result Value Ref Range Status   SARS Coronavirus 2 by RT PCR NEGATIVE NEGATIVE Final    Comment: (NOTE) SARS-CoV-2 target nucleic acids are NOT DETECTED.  The SARS-CoV-2 RNA is generally detectable in upper respiratory specimens during the acute phase of infection. The lowest concentration of SARS-CoV-2 viral copies this assay can detect is 138 copies/mL. A negative result does not preclude SARS-Cov-2 infection and should not be used as the sole basis for treatment or other patient management decisions. A negative result may occur with  improper specimen collection/handling, submission of specimen other than  nasopharyngeal swab, presence of viral mutation(s) within the areas targeted by this assay, and inadequate number of viral copies(<138 copies/mL). A negative result must be combined with clinical observations, patient history, and epidemiological information. The expected result is Negative.  Fact Sheet for Patients:  BloggerCourse.com  Fact Sheet for Healthcare Providers:  SeriousBroker.it  This test is no t yet approved or cleared by the [place] FDA and  has been authorized for detection and/or diagnosis of SARS-CoV-2 by FDA under an Emergency Use Authorization (EUA). This EUA will remain  in effect (meaning this test can be used) for the duration of the COVID-19 declaration under Section 564(b)(1) of the Act, 21 U.S.C.section 360bbb-3(b)(1), unless the authorization is terminated  or revoked sooner.       Influenza A by PCR NEGATIVE NEGATIVE Final   Influenza B by PCR NEGATIVE NEGATIVE Final    Comment: (NOTE) The Xpert Xpress SARS-CoV-2/FLU/RSV plus assay is intended as an aid in the diagnosis of influenza from Nasopharyngeal swab specimens and should not be used as a sole basis for treatment. Nasal washings and aspirates are unacceptable for Xpert Xpress SARS-CoV-2/FLU/RSV testing.  Fact Sheet for Patients: BloggerCourse.com  Fact Sheet for Healthcare Providers: SeriousBroker.it  This test is not yet approved or cleared by the [place] FDA and has been authorized for detection and/or diagnosis of SARS-CoV-2 by FDA under an Emergency Use Authorization (EUA). This EUA will remain in effect (meaning this test can be used) for the duration of the COVID-19 declaration under Section 564(b)(1) of the Act, 21 U.S.C. section 360bbb-3(b)(1), unless the authorization is terminated or revoked.     Resp Syncytial Virus by PCR NEGATIVE NEGATIVE Final    Comment:  (NOTE) Fact Sheet for Patients: BloggerCourse.com  Fact Sheet for Healthcare Providers: SeriousBroker.it  This test is not yet approved or cleared by the [place] FDA and has been authorized for detection and/or diagnosis of SARS-CoV-2 by FDA under an Emergency Use Authorization (EUA). This EUA will remain in effect (meaning this test can be used) for the duration of the COVID-19 declaration under Section 564(b)(1) of the Act, 21 U.S.C. section 360bbb-3(b)(1), unless the authorization is terminated or revoked.  Performed at [facility], [address] [place], [place] [address]   MRSA Next Gen by PCR, Nasal     Status: Abnormal   Collection Time: 12/06/22  4:27 AM   Specimen: Nasal Mucosa; Nasal Swab  Result Value Ref Range Status   MRSA by PCR Next Gen DETECTED (A) NOT DETECTED Final    Comment: RESULT CALLED TO, READ BACK BY AND VERIFIED WITH: KRISTINE CHAMBERS AT 1478 12/06/22.PMF (NOTE) The GeneXpert MRSA Assay (FDA approved for NASAL specimens only), is one component of a comprehensive MRSA colonization surveillance program. It is not intended to diagnose MRSA infection nor to guide  or monitor treatment for MRSA infections. Test performance is not FDA approved in patients less than 97 years old. Performed at [facility], [address] [place], [place] [address]     Procedures and diagnostic studies:  CT HEAD CODE STROKE WO CONTRAST  Result Date: 12/05/2022 CLINICAL DATA:  Code stroke.  Right facial droop and slurred speech EXAM: CT HEAD WITHOUT CONTRAST TECHNIQUE: Contiguous axial images were obtained from the base of the skull through the vertex without intravenous contrast. RADIATION DOSE REDUCTION: This exam was performed according to the departmental dose-optimization program which includes automated exposure control, adjustment of the mA and/or kV according to patient size and/or use of  iterative reconstruction technique. COMPARISON:  None Available. FINDINGS: Brain: There is no mass, hemorrhage or extra-axial collection. The size and configuration of the ventricles and extra-axial CSF spaces are normal. The brain parenchyma is normal, without evidence of acute or chronic infarction. Vascular: No abnormal hyperdensity of the major intracranial arteries or dural venous sinuses. No intracranial atherosclerosis. Skull: The visualized skull base, calvarium and extracranial soft tissues are normal. Sinuses/Orbits: No fluid levels or advanced mucosal thickening of the visualized paranasal sinuses. No mastoid or middle ear effusion. The orbits are normal. ASPECTS [facility] Stroke Program Early CT Score) - Ganglionic level infarction (caudate, lentiform nuclei, internal capsule, insula, M1-M3 cortex): 7 - Supraganglionic infarction (M4-M6 cortex): 3 Total score (0-10 with 10 being normal): 10 IMPRESSION: 1. No acute intracranial abnormality. 2. ASPECTS is 10. These results were called by telephone at the time of interpretation on 12/05/2022 at 9:39 pm to provider [facility] , who verbally acknowledged these results. Electronically Signed   By: Deatra Robinson M.D.   On: 12/05/2022 21:39   DG Chest Port 1 View  Result Date: 12/05/2022 CLINICAL DATA:  Chest pain EXAM: PORTABLE CHEST 1 VIEW COMPARISON:  06/10/2021 FINDINGS: Cardiac shadow is mildly enlarged. Dialysis catheter is noted on the right new from the prior exam. Lungs are clear bilaterally. Mild central vascular prominence is noted likely related to volume overload. No effusion is seen. No bony abnormality is noted. IMPRESSION: Mild vascular congestion likely related to volume overload. Electronically Signed   By: Alcide Clever M.D.   On: 12/05/2022 21:21               LOS: 0 days   Alondria Mousseau  [facility]   Pager on [email or web address]. If 7PM-7AM, please contact night-coverage at [email or web address]     12/06/2022, 3:22 PM

## 2022-12-06 NOTE — Progress Notes (Signed)
Anticoagulation monitoring(Lovenox):  72 yo female ordered Lovenox 40 mg Q24h    Filed Weights   12/05/22 2101  Weight: 84.7 kg (186 lb 12.8 oz)   BMI 32   Lab Results  Component Value Date   CREATININE 4.53 (H) 12/05/2022   CREATININE 3.67 (H) 06/13/2021   CREATININE 3.40 (H) 06/12/2021   Estimated Creatinine Clearance: 11.5 mL/min (A) (by C-G formula based on SCr of 4.53 mg/dL (H)). Hemoglobin & Hematocrit     Component Value Date/Time   HGB 10.2 (L) 12/05/2022 2056   HGB 13.5 08/11/2013 0933   HCT 33.4 (L) 12/05/2022 2056   HCT 42.6 08/11/2013 0933     Per Protocol for Patient with estCrcl < 15 ml/min and BMI < 30, will transition to Heparin 5000 units SQ Q8H.

## 2022-12-06 NOTE — Significant Event (Signed)
Rapid Response Event Note   Reason for Call : called RRT for hypotention   Initial Focused Assessment: laying in bed, lethargic but arouses, BP 60's over 20's, see flowsheets for details. Dialysis RN gave 200 ml bolus.      Interventions: spoke to [facility] over phone, transfer to ICU for CRRT. Orders placed, NP Jeri Modena met in ICU 11 to evaluate. RN Katie L received report from floor.   Plan of Care: as above    Event Summary: as above  MD Notified: [email or web address] [id number] Call Time:1202 Arrival Time:1203 End Time:1230  Kamile Fassler A, RN

## 2022-12-06 NOTE — H&P (Addendum)
[place]   PATIENT NAME: Alexandra Foster    MR#:  [id number]  DATE OF BIRTH:  [birth year]  DATE OF ADMISSION:  12/05/2022  PRIMARY CARE PHYSICIAN: Patrecia Pour, MD   Patient is coming from: [facility]  REQUESTING/REFERRING PHYSICIAN: Chiquita Loth, MD  CHIEF COMPLAINT:   Chief Complaint  Patient presents with   Altered Mental Status    Brought in by medic from snf [facility]), with AMS/ lethargy. Last well, unknown., According to medic baseline unknown. Facility currently ruling out aspiration pna. History of dialysis, g-tube. Temp 102.2 when medic arrived to facility.     HISTORY OF PRESENT ILLNESS:  Sakinah Gustaveson is a 72 y.o. female with medical history significant for asthma, COPD, type diabetes mellitus, s/p G-tube, GERD, ESRD on HD, hypertension, osteoarthritis, paroxysmal atrial fibrillation and OSA on CPAP, who presented to the emergency room with acute onset of altered mental status with lethargy and decreased responsiveness.  There was a concern about aspiration pneumonia at his facility as the patient had an episode of nausea and vomiting and was reportedly coughing per her sister without wheezing or dyspnea or hypoxia.  The patient's temperature was 102.2 when EMS arrived to this facility.  The patient was not responding to questions throughout transport.  ED Course: When the patient came to the ER, temperature was 101.4 here with respiratory to 29 with otherwise normal vital signs.  Labs revealed hyponatremia 128 and hypochloremia of 90 with blood glucose of 197, BUN of 67 and creatinine 4.53 with CO2 of 22 and anion gap 16.  Alk phos was 157 and albumin 3.3, AST 66 and ALT 87 with total obtained of 8.6.  Lactic acid was 2.3 and later 2.9.  CBC showed leukocytosis of 21.4 with neutrophilia and anemia.  Respiratory panel including influenza A, B, RSV and COVID-19 PCR came back negative.  UA was positive for UTI alcohol level was less than  10.  Urine drug screen was positive for tricyclic's. EKG as reviewed by me : Pending Imaging: Portable chest x-ray showed mild vascular congestion likely related to volume overload.  The patient was given IV Rocephin and Zithromax as well as 3 L bolus of IV normal saline.  She will be admitted to a progressive unit bed for further evaluation and management. PAST MEDICAL HISTORY:   Past Medical History:  Diagnosis Date   AKI (acute kidney injury) (HCC)    a. 04/2021 in setting of bacteremia/shock.   Arthritis    Asthma    Bacteremia    a. 04/2021 S pyogenes bacteremia in setting of lower ext cellulitis.   COPD (chronic obstructive pulmonary disease) (HCC)    Diabetes mellitus without complication (HCC)    Endometriosis    GERD (gastroesophageal reflux disease)    History of echocardiogram    a. 07/2013 Echo: EF 55-60%, impaired relaxation, mild TR; b. 04/2021 Echo: EF 50-55%, mild LVH, nl RV fxn, mild BAE, Ao sclerosis w/o stenosis.   Hypertension    Obesity    PAF (paroxysmal atrial fibrillation) (HCC)    a. 04/2021 in setting of septic shock/cellulitis.   Sleep apnea    CPAP    PAST SURGICAL HISTORY:   Past Surgical History:  Procedure Laterality Date   ABDOMINAL HYSTERECTOMY     APPLICATION OF WOUND VAC Left 05/17/2021   Procedure: APPLICATION OF WOUND VAC/WOUND VAC EXCHANGE-Matrix Myriad;  Surgeon: Carolan Shiver, MD;  Location: [facility] ORS;  Service: General;  Laterality: Left;  APPLICATION OF WOUND VAC  05/24/2021   Procedure: APPLICATION OF WOUND VAC;  Surgeon: Carolan Shiver, MD;  Location: [facility] OR[facility];  Service: General;;   APPLICATION OF WOUND VAC  05/10/2021   Procedure: APPLICATION OF WOUND VAC;  Surgeon: Carolan Shiver, MD;  Location: [facility];  Service: General;;   COLONOSCOPY  10/29/2006   Dr Servando Snare   COLONOSCOPY WITH PROPOFOL N/A 11/05/2016   Procedure: COLONOSCOPY WITH PROPOFOL;  Surgeon: Earline Mayotte, MD;  Location: [facility] END[facility];  Service:  Endoscopy;  Laterality: N/A;   INCISION AND DRAINAGE OF WOUND Left 05/24/2021   Procedure: IRRIGATION AND DEBRIDEMENT LEFT LEG;  Surgeon: Carolan Shiver, MD;  Location: [facility];  Service: General;  Laterality: Left;   INCISION AND DRAINAGE OF WOUND Left 05/10/2021   Procedure: IRRIGATION AND DEBRIDEMENT LEFT LEG;  Surgeon: Carolan Shiver, MD;  Location: [facility];  Service: General;  Laterality: Left;   IR FLUORO GUIDE CV LINE RIGHT  06/12/2021   IR PERC TUN PERIT CATH WO PORT S&I /IMAG  06/12/2021   IR REPLC GASTRO/COLONIC TUBE PERCUT W/FLUORO  06/12/2021   IVC FILTER INSERTION N/A 05/29/2021   Procedure: IVC FILTER INSERTION;  Surgeon: Renford Dills, MD;  Location: [facility];  Service: Cardiovascular;  Laterality: N/A;   MINOR GRAFT APPLICATION  05/24/2021   Procedure: Myriad Matrix  APPLICATION;  Surgeon: Carolan Shiver, MD;  Location: [facility];  Service: General;;   NASAL SINUS SURGERY  2002   Dr Chestine Spore   PEG PLACEMENT N/A 05/28/2021   Procedure: PERCUTANEOUS ENDOSCOPIC GASTROSTOMY (PEG) PLACEMENT;  Surgeon: Sung Amabile, DO;  Location: [facility];  Service: General;  Laterality: N/A;  TRAVEL CASE   TRACHEOSTOMY TUBE PLACEMENT N/A 05/17/2021   Procedure: TRACHEOSTOMY;  Surgeon: Geanie Logan, MD;  Location: [facility];  Service: ENT;  Laterality: N/A;   WOUND DEBRIDEMENT Left 05/07/2021   Procedure: DEBRIDEMENT WOUND;  Surgeon: Carolan Shiver, MD;  Location: [facility];  Service: General;  Laterality: Left;    SOCIAL HISTORY:   Social History   Tobacco Use   Smoking status: Former    Current packs/day: 0.00    Average packs/day: 2.0 packs/day for 40.0 years (80.0 ttl pk-yrs)    Types: Cigarettes    Start date: 1963    Quit date: 2003    Years since quitting: 21.6   Smokeless tobacco: Former    Types: Snuff    Quit date: 04/2001   Tobacco comments:    smoking cessation materials not required  Substance Use Topics   Alcohol use: No     Alcohol/week: 0.0 standard drinks of alcohol    FAMILY HISTORY:   Family History  Problem Relation Age of Onset   Congestive Heart Failure Mother    Coronary artery disease Father 36   Breast cancer Sister 52   Heart disease Brother    Varicose Veins Brother    Alcohol abuse Brother     DRUG ALLERGIES:  No Known Allergies  REVIEW OF SYSTEMS:   ROS As per history of present illness. All pertinent systems were reviewed above. Constitutional, HEENT, cardiovascular, respiratory, GI, GU, musculoskeletal, neuro, psychiatric, endocrine, integumentary and hematologic systems were reviewed and are otherwise negative/unremarkable except for positive findings mentioned above in the HPI.   MEDICATIONS AT HOME:   Prior to Admission medications   Medication Sig Start Date End Date Taking? Authorizing Provider  amiodarone (PACERONE) 200 MG tablet Place 1 tablet (200 mg total) into feeding tube 2 (two) times daily. 06/13/21  Erin Fulling, MD  artificial tears (LACRILUBE) OINT ophthalmic ointment Place into both eyes every 4 (four) hours as needed for dry eyes. 06/13/21   Erin Fulling, MD  ascorbic acid (VITAMIN C) 500 MG tablet Place 1 tablet (500 mg total) into feeding tube 2 (two) times daily. 06/13/21   Erin Fulling, MD  chlorhexidine gluconate, MEDLINE KIT, (PERIDEX) 0.12 % solution 15 mLs by Mouth Rinse route 2 (two) times daily. 06/13/21   Erin Fulling, MD  cholecalciferol (VITAMIN D) 25 MCG tablet Place 2 tablets (2,000 Units total) into feeding tube daily. 06/14/21   Erin Fulling, MD  collagenase (SANTYL) ointment Apply topically daily. 06/14/21   Erin Fulling, MD  dextrose 5 % SOLN 100 mL with copper chloride 0.4 MG/ML SOLN 2 mg Inject 2 mg into the vein daily. 06/14/21   Erin Fulling, MD  escitalopram (LEXAPRO) 10 MG tablet Place 1 tablet (10 mg total) into feeding tube daily. 06/14/21   Erin Fulling, MD  ipratropium-albuterol (DUONEB) 0.5-2.5 (3) MG/3ML SOLN Take 3 mLs by nebulization 3 (three) times  daily. 06/13/21   Erin Fulling, MD  lactobacillus (FLORANEX/LACTINEX) PACK Place 1 packet (1 g total) into feeding tube 3 (three) times daily with meals. 06/13/21   Erin Fulling, MD  lip balm (BLISTEX) OINT Apply 1 application topically 3 (three) times daily. 06/13/21   Erin Fulling, MD  loperamide (IMODIUM) 2 MG capsule Take 1 capsule (2 mg total) by mouth as needed for diarrhea or loose stools. 06/13/21   Erin Fulling, MD  midodrine (PROAMATINE) 5 MG tablet Place 3 tablets (15 mg total) into feeding tube 3 (three) times daily with meals. 06/13/21   Erin Fulling, MD  multivitamin (RENA-VIT) TABS tablet Place 1 tablet into feeding tube at bedtime. 06/13/21   Erin Fulling, MD  pantoprazole sodium (PROTONIX) 40 mg Place 40 mg into feeding tube daily. 06/13/21   Erin Fulling, MD  sennosides (SENOKOT) 8.8 MG/5ML syrup Place 10 mLs into feeding tube at bedtime as needed for moderate constipation. 06/13/21   Erin Fulling, MD      VITAL SIGNS:  Blood pressure (!) 115/54, pulse 64, temperature 99.9 F (37.7 C), temperature source Oral, resp. rate (!) 21, height 5\' 4"  (1.626 m), weight 84.7 kg, SpO2 97%.  PHYSICAL EXAMINATION:  Physical Exam  GENERAL:  72 y.o.-year-old African-American female patient lying in the bed with no acute distress.  She was somnolent but arousable. EYES: Pupils equal, round, reactive to light and accommodation. No scleral icterus. Extraocular muscles intact.  HEENT: Head atraumatic, normocephalic. Oropharynx and nasopharynx clear.  NECK:  Supple, no jugular venous distention. No thyroid enlargement, no tenderness.  LUNGS: Slight diminished bibasal breath sounds with no wheezing, rales,rhonchi or crepitation. No use of accessory muscles of respiration.  CARDIOVASCULAR: Regular rate and rhythm, S1, S2 normal. No murmurs, rubs, or gallops.  ABDOMEN: Soft, nondistended, nontender. Bowel sounds present. No organomegaly or mass.  G-tube in place. EXTREMITIES: No pedal edema, cyanosis, or  clubbing.  She is status post left AKA. NEUROLOGIC: Cranial nerves II through XII are intact. Muscle strength 5/5 in all extremities. Sensation intact. Gait not checked.  PSYCHIATRIC: The patient is alert and oriented x 3.  Normal affect and good eye contact. SKIN: No obvious rash, lesion, or ulcer.   LABORATORY PANEL:   CBC Recent Labs  Lab 12/05/22 2056  WBC 21.4*  HGB 10.2*  HCT 33.4*  PLT 411*   ------------------------------------------------------------------------------------------------------------------  Chemistries  Recent Labs  Lab 12/05/22 2056  NA 128*  K 4.2  CL 90*  CO2 22  GLUCOSE 197*  BUN 67*  CREATININE 4.53*  CALCIUM 9.6  AST 66*  ALT 87*  ALKPHOS 157*  BILITOT 0.6   ------------------------------------------------------------------------------------------------------------------  Cardiac Enzymes No results for input(s): "TROPONINI" in the last 168 hours. ------------------------------------------------------------------------------------------------------------------  RADIOLOGY:  CT HEAD CODE STROKE WO CONTRAST  Result Date: 12/05/2022 CLINICAL DATA:  Code stroke.  Right facial droop and slurred speech EXAM: CT HEAD WITHOUT CONTRAST TECHNIQUE: Contiguous axial images were obtained from the base of the skull through the vertex without intravenous contrast. RADIATION DOSE REDUCTION: This exam was performed according to the departmental dose-optimization program which includes automated exposure control, adjustment of the mA and/or kV according to patient size and/or use of iterative reconstruction technique. COMPARISON:  None Available. FINDINGS: Brain: There is no mass, hemorrhage or extra-axial collection. The size and configuration of the ventricles and extra-axial CSF spaces are normal. The brain parenchyma is normal, without evidence of acute or chronic infarction. Vascular: No abnormal hyperdensity of the major intracranial arteries or dural venous  sinuses. No intracranial atherosclerosis. Skull: The visualized skull base, calvarium and extracranial soft tissues are normal. Sinuses/Orbits: No fluid levels or advanced mucosal thickening of the visualized paranasal sinuses. No mastoid or middle ear effusion. The orbits are normal. ASPECTS [facility] Stroke Program Early CT Score) - Ganglionic level infarction (caudate, lentiform nuclei, internal capsule, insula, M1-M3 cortex): 7 - Supraganglionic infarction (M4-M6 cortex): 3 Total score (0-10 with 10 being normal): 10 IMPRESSION: 1. No acute intracranial abnormality. 2. ASPECTS is 10. These results were called by telephone at the time of interpretation on 12/05/2022 at 9:39 pm to provider [facility] , who verbally acknowledged these results. Electronically Signed   By: Deatra Robinson M.D.   On: 12/05/2022 21:39   DG Chest Port 1 View  Result Date: 12/05/2022 CLINICAL DATA:  Chest pain EXAM: PORTABLE CHEST 1 VIEW COMPARISON:  06/10/2021 FINDINGS: Cardiac shadow is mildly enlarged. Dialysis catheter is noted on the right new from the prior exam. Lungs are clear bilaterally. Mild central vascular prominence is noted likely related to volume overload. No effusion is seen. No bony abnormality is noted. IMPRESSION: Mild vascular congestion likely related to volume overload. Electronically Signed   By: Alcide Clever M.D.   On: 12/05/2022 21:21      IMPRESSION AND PLAN:  Assessment and Plan: * Sepsis due to gram-negative UTI [facility]) - The patient be admitted to a progressive unit bed. - Sepsis manifested by tachypnea and fever as well as significant leukocytosis. - Will continue antibiotic therapy with IV Rocephin. - Will be cautious with hydration at this time given her ESRD and vascular congestion. - We will follow blood and urine cultures.  Acute metabolic encephalopathy - This is like secondary to #1. - Will follow neurochecks every 4 hours for 24 hours. - Management otherwise as above.  Paroxysmal  atrial fibrillation (HCC) - Continue amiodarone.  Type 2 diabetes mellitus with chronic kidney disease, with long-term current use of insulin (HCC) - We will continue basal coverage and place her on supplemental coverage with NovoLog. - We will hold off metformin XR.  ESRD on hemodialysis [facility]) - The patient has mild vascular pulmonary congestion on chest x-ray. - We are holding off more IV hydration. - Nephrology consult will be obtained. - I notified Dr. Wynelle Link about the patient.  Anxiety and depression - We will continue Klonopin BuSpar and Lexapro.   DVT prophylaxis: Lovenox. Advanced Care Planning:  Code Status:  The patient is DNR and DNI. Family Communication:  The plan of care was discussed in details with the patient (and family). I answered all questions. The patient agreed to proceed with the above mentioned plan. Further management will depend upon hospital course. Disposition Plan: Back to previous home environment Consults called: Nephrology. All the records are reviewed and case discussed with ED provider.  Status is: Inpatient.  At the time of the admission, it appears that the appropriate admission status for this patient is inpatient.  This is judged to be reasonable and necessary in order to provide the required intensity of service to ensure the patient's safety given the presenting symptoms, physical exam findings and initial radiographic and laboratory data in the context of comorbid conditions.  The patient requires inpatient status due to high intensity of service, high risk of further deterioration and high frequency of surveillance required.  I certify that at the time of admission, it is my clinical judgment that the patient will require inpatient hospital care extending more than 2 midnights.                            Dispo: The patient is from: Home              Anticipated d/c is to: Home              Patient currently is not medically stable to d/c.               Difficult to place patient: No  Hannah Beat M.D on 12/06/2022 at 3:08 AM  [facility]   From 7 PM-7 AM, contact night-coverage [email or web address]  CC: Primary care physician; Shackleford, Lovell Sheehan, MD

## 2022-12-06 NOTE — Op Note (Signed)
Operative Note     Preoperative diagnosis:   1. ESRD with functional permanent access  Postoperative diagnosis:  1. ESRD with functional permanent access  Procedure:  Removal of RIGHT Internal Jugular Vein Permcath  Surgeon:  Eli Hose, MD  Anesthesia:  Local  EBL:  Minimal  Indication for the Procedure:  The patient has a functional permanent dialysis access and has evidence of sepsis and infection of permcath.  This needs to be removed.  Risks and benefits are discussed and informed consent is obtained.  Description of the Procedure:  The patient's RIGHT neck, chest and existing catheter were sterilely prepped and draped. The area around the catheter was anesthetized copiously with 1% lidocaine. The catheter was dissected out with curved hemostats until the cuff was freed from the surrounding fibrous sheath. The fiber sheath was transected, and the catheter was then removed in its entirety using gentle traction. The tip of the catheter was sent for culture. Pressure was held and sterile dressings were placed. The patient tolerated the procedure well.     Arayah Krouse A  12/06/2022, 2:11 PM This note was created with Dragon Medical transcription system. Any errors in dictation are purely unintentional.

## 2022-12-06 NOTE — ED Provider Notes (Signed)
-----------------------------------------   12:56 AM on 12/06/2022 -----------------------------------------   UA+; patient already received antibiotics per ED sepsis protocol.  Will consult hospital services for evaluation and admission.   Irean Hong, MD 12/06/22 [phone]

## 2022-12-06 NOTE — Plan of Care (Addendum)
Patient remains in ICU at time of writing. CRRT restarted at 2026 hours by this RN. Heparin syringe added into filter per orders. Patient remains on room air. Patient is requiring levophed infusion for BP support.  Edit: 2335 hrs: Patient assisted by this RN with the removal of CPAP mask. Patient states she would no longer be wearing it tonight.   Problem: Fluid Volume: Goal: Hemodynamic stability will improve Outcome: Progressing   Problem: Clinical Measurements: Goal: Diagnostic test results will improve Outcome: Progressing Goal: Signs and symptoms of infection will decrease Outcome: Progressing   Problem: Respiratory: Goal: Ability to maintain adequate ventilation will improve Outcome: Progressing   Problem: Education: Goal: Knowledge of General Education information will improve Description: Including pain rating scale, medication(s)/side effects and non-pharmacologic comfort measures Outcome: Progressing   Problem: Health Behavior/Discharge Planning: Goal: Ability to manage health-related needs will improve Outcome: Progressing   Problem: Clinical Measurements: Goal: Ability to maintain clinical measurements within normal limits will improve Outcome: Progressing Goal: Will remain free from infection Outcome: Progressing Goal: Diagnostic test results will improve Outcome: Progressing Goal: Respiratory complications will improve Outcome: Progressing Goal: Cardiovascular complication will be avoided Outcome: Progressing   Problem: Activity: Goal: Risk for activity intolerance will decrease Outcome: Progressing   Problem: Nutrition: Goal: Adequate nutrition will be maintained Outcome: Progressing   Problem: Coping: Goal: Level of anxiety will decrease Outcome: Progressing   Problem: Elimination: Goal: Will not experience complications related to bowel motility Outcome: Progressing Goal: Will not experience complications related to urinary retention Outcome:  Progressing   Problem: Pain Managment: Goal: General experience of comfort will improve Outcome: Progressing   Problem: Safety: Goal: Ability to remain free from injury will improve Outcome: Progressing   Problem: Skin Integrity: Goal: Risk for impaired skin integrity will decrease Outcome: Progressing

## 2022-12-06 NOTE — Progress Notes (Signed)
Pharmacy Antibiotic Note  Alexandra Foster is a 72 y.o. female admitted on 12/05/2022 with bacteremia.  Bcx 4 of 4 GPC, BCID showing MRSA and Staph epidermidis. Patient started on CRRT 8/24. Pharmacy has been consulted for Vancomycin dosing and monitoring.   Plan: Vancomycin 1750mg  IV loading dose ( ~20mg /kg) Followed by vancomycin 1000mg  IV every 24 hours.  Will plan for Vancomycin level before 3rd dose.   Pharmacy will continue to monitor and adjust per protocol.   Height: 5\' 4"  (162.6 cm) Weight: 84.4 kg (186 lb 1.1 oz) IBW/kg (Calculated) : 54.7  Temp (24hrs), Avg:100.6 F (38.1 C), Min:99 F (37.2 C), Max:102.1 F (38.9 C)  Recent Labs  Lab 12/05/22 2056 12/06/22 0016 12/06/22 0404 12/06/22 0734 12/06/22 0735  WBC 21.4*  --  16.0* 16.1*  --   CREATININE 4.53*  --  4.23*  --   --   LATICACIDVEN 2.3* 2.9* 2.0*  --  1.9    Estimated Creatinine Clearance: 12.3 mL/min (A) (by C-G formula based on SCr of 4.23 mg/dL (H)).    No Known Allergies  Antimicrobials this admission: 8/23 Azithromycin/CRO  >> x1 8/24 Vancomycin >>   Microbiology results: 8/23 BCx: 4 of 4 GPC 8/23 BCID: MRSA, Staph epidermidis  8/24 MRSA PCR: Positive   Thank you for allowing pharmacy to be a part of this patient's care.  Gardner Candle, PharmD, BCPS Clinical Pharmacist 12/06/2022 12:31 PM

## 2022-12-06 NOTE — Progress Notes (Signed)
Pharmacy Antibiotic Note  Alexandra Foster is a 72 y.o. female admitted on 12/05/2022 with bacteremia.  Bcx 4 of 4 GPC, BCID showing MRSA and Staph epidermidis. Patient started on CRRT 8/24. Pharmacy has been consulted for Vancomycin dosing and monitoring.   Plan:  1) vancomycin 1750mg  IV loading dose ( ~20mg /kg) Followed by vancomycin 1000mg  IV every 24 hours.  Will plan for Vancomycin level before 3rd dose.   2) start cefepime 2 grams IV every 12 hours  Pharmacy will continue to monitor and adjust per protocol.   Height: 5\' 4"  (162.6 cm) Weight: 84.4 kg (186 lb 1.1 oz) IBW/kg (Calculated) : 54.7  Temp (24hrs), Avg:100.6 F (38.1 C), Min:99 F (37.2 C), Max:102.1 F (38.9 C)  Recent Labs  Lab 12/05/22 2056 12/06/22 0016 12/06/22 0354 12/06/22 0404 12/06/22 0734 12/06/22 0735  WBC 21.4*  --   --  16.0* 16.1*  --   CREATININE 4.53*  --  4.34* 4.23*  --   --   LATICACIDVEN 2.3* 2.9*  --  2.0*  --  1.9    Estimated Creatinine Clearance: 12.3 mL/min (A) (by C-G formula based on SCr of 4.23 mg/dL (H)).    No Known Allergies  Antimicrobials this admission: 8/23 azithromycin/CRO  >> x1 8/24 cefepime >> 8/24 vancomycin >>   Microbiology results: 8/23 BCx: 4 of 4 GPC 8/23 BCID: MRSA, Staph epidermidis  8/24 MRSA PCR: Positive   Thank you for allowing pharmacy to be a part of this patient's care.  Lowella Bandy, PharmD, BCPS Clinical Pharmacist 12/06/2022 12:52 PM

## 2022-12-06 NOTE — Progress Notes (Signed)
   12/06/22 0309  Assess: MEWS Score  Temp (!) 102.1 F (38.9 C)  BP (!) 99/44  MAP (mmHg) (!) 60  ECG Heart Rate 63  Resp (!) 26  Level of Consciousness Alert  SpO2 95 %  Assess: MEWS Score  MEWS Temp 2  MEWS Systolic 1  MEWS Pulse 0  MEWS RR 2  MEWS LOC 0  MEWS Score 5  MEWS Score Color Red  Assess: if the MEWS score is Yellow or Red  Were vital signs accurate and taken at a resting state? Yes  Does the patient meet 2 or more of the SIRS criteria? Yes  Does the patient have a confirmed or suspected source of infection? Yes  MEWS guidelines implemented  Yes, red  Treat  MEWS Interventions Considered administering scheduled or prn medications/treatments as ordered  Take Vital Signs  Increase Vital Sign Frequency  Red: Q1hr x2, continue Q4hrs until patient remains green for 12hrs  Escalate  MEWS: Escalate Red: Discuss with charge nurse and notify provider. Consider notifying RRT. If remains red for 2 hours consider need for higher level of care  Notify: Charge Nurse/RN  Name of Charge Nurse/RN Notified Tiffany  Provider Notification  Provider Name/Title Morrsion NP  Date Provider Notified 12/06/22  Time Provider Notified [phone]  Method of Notification Face-to-face  Notification Reason Change in status  Provider response At bedside  Date of Provider Response 12/06/22  Time of Provider Response 0356  Assess: SIRS CRITERIA  SIRS Temperature  1  SIRS Pulse 0  SIRS Respirations  1  SIRS WBC 1  SIRS Score Sum  3   Patient admitted for a UTI, has an elevated temperature. Patient has an history of hypotension, takes midodrine. NP Jon Billings is aware.

## 2022-12-06 NOTE — Progress Notes (Signed)
Unable to complete patient profile due to AMS and lethargy.

## 2022-12-06 NOTE — Progress Notes (Signed)
       Date: 12/06/2022  Patient name: Alexandra Foster  Medical record number: [id number]  Date of birth: 02-Sep-1950    Patient with MRSA bacteremia likely due to HD catheter  She also has staph epi in blood by [facility]  She is critically ill and in the ICU and on pressors and now CRRT  Original HD line has been removed but she will need a "line holiday" when she stablizes  UTI was blamed originally in context of her encephalopathy but I do not think we need to be worrying about organisms in the urine with MRSA in the blood now  TTE will be done  I will repeat blood cultures tomorrow and need to be repeated once she has her central line holiday.      Acey Lav 12/06/2022, 5:02 PM

## 2022-12-06 NOTE — Assessment & Plan Note (Signed)
-   We will place her on supplemental coverage with NovoLog. - We will hold off metformin ER.

## 2022-12-06 NOTE — Progress Notes (Signed)
CRRT Filter pressures >400. Unable to return blood. Treatment stopped. Nephrology made aware of need for heparin.

## 2022-12-06 NOTE — TOC Progression Note (Signed)
Transition of Care [facility]) - Progression Note    Patient Details  Name: Alexandra Foster MRN: [id number] Date of Birth: 11/15/1950  Transition of [facility]) CM/SW Contact  Bing Quarry, RN Phone Number: 12/06/2022, 11:48 AM  Clinical Narrative:  8/24: Admitted 8/23 via EMS from [facility]. Htx of HD, G-tube, AMS, unable to answer questions this am. In HD this am. TOC for SNF potential/return. Neuro consult completed with dx of Metabolic Encephalopathy. +UA. Started on IV antibiotics.   Gabriel Cirri MSN RN CM  Transitions of Care Department [facility] [phone] Weekends Only          Expected Discharge Plan and Services                                               Social Determinants of Health (SDOH) Interventions SDOH Screenings   Food Insecurity: Patient Unable To Answer (12/06/2022)  Housing: High Risk (12/06/2022)  Transportation Needs: No Transportation Needs (12/06/2022)  Utilities: Not At Risk (12/06/2022)  Alcohol Screen: Low Risk  (02/11/2021)  Depression (PHQ2-9): Low Risk  (04/30/2021)  Financial Resource Strain: Medium Risk (08/09/2020)  Physical Activity: Inactive (08/09/2020)  Social Connections: Moderately Integrated (08/09/2020)  Stress: No Stress Concern Present (08/09/2020)  Tobacco Use: Medium Risk (12/05/2022)    Readmission Risk Interventions     No data to display

## 2022-12-06 NOTE — Procedures (Signed)
Central Venous Catheter Insertion Procedure Note  Alexandra Foster  [id number]  May 03, 1950  Date:12/06/22  Time:2:58 PM   Provider Performing:Amal Saiki D Elvina Sidle   Procedure: Insertion of Non-tunneled Central Venous Catheter(36556)with US guidance (40981)    Indication(s) Medication administration, Difficult access, and Hemodialysis  Consent Risks of the procedure as well as the alternatives and risks of each were explained to the patient and/or caregiver.  Consent for the procedure was obtained and is signed in the bedside chart  Anesthesia Topical only with 1% lidocaine   Timeout Verified patient identification, verified procedure, site/side was marked, verified correct patient position, special equipment/implants available, medications/allergies/relevant history reviewed, required imaging and test results available.  Sterile Technique Maximal sterile technique including full sterile barrier drape, hand hygiene, sterile gown, sterile gloves, mask, hair covering, sterile ultrasound probe cover (if used).  Procedure Description Area of catheter insertion was cleaned with chlorhexidine and draped in sterile fashion.   With real-time ultrasound guidance a HD catheter was placed into the left femoral vein.  Nonpulsatile blood flow and easy flushing noted in all ports.  The catheter was sutured in place and sterile dressing applied.  Complications/Tolerance None; patient tolerated the procedure well. Chest X-ray is ordered to verify placement for internal jugular or subclavian cannulation.  Chest x-ray is not ordered for femoral cannulation.  EBL Minimal  Specimen(s) None  BIOPATCH applied to the insertion site. Line secured at the 20 cm mark.   Harlon Ditty, AGACNP-BC [facility] Prefer epic messenger for cross cover needs If after hours, please call E-link

## 2022-12-06 NOTE — Assessment & Plan Note (Addendum)
This is improved.  Patient answer simple questions.

## 2022-12-06 NOTE — Assessment & Plan Note (Addendum)
Patient on sliding scale coverage.  Last hemoglobin A1c 5.2.

## 2022-12-06 NOTE — Progress Notes (Signed)
1153: Blood pressure still low after 200 ml NS bolus. Patient's HD was terminated as ordered by HD MD. RRT was called. Patient is easily arousable with voice and oriented x 3.

## 2022-12-07 ENCOUNTER — Inpatient Hospital Stay: Payer: Medicare Other

## 2022-12-07 ENCOUNTER — Inpatient Hospital Stay (HOSPITAL_COMMUNITY)
Admit: 2022-12-07 | Discharge: 2022-12-07 | Disposition: A | Discharge status: Home / Self Care | Payer: Medicare Other | Attending: Pulmonary Disease | Admitting: Pulmonary Disease

## 2022-12-07 DIAGNOSIS — I480 Paroxysmal atrial fibrillation: Secondary | ICD-10-CM | POA: Diagnosis not present

## 2022-12-07 DIAGNOSIS — A419 Sepsis, unspecified organism: Secondary | ICD-10-CM

## 2022-12-07 DIAGNOSIS — R6521 Severe sepsis with septic shock: Secondary | ICD-10-CM | POA: Diagnosis not present

## 2022-12-07 DIAGNOSIS — A4102 Sepsis due to Methicillin resistant Staphylococcus aureus: Secondary | ICD-10-CM

## 2022-12-07 DIAGNOSIS — R7881 Bacteremia: Secondary | ICD-10-CM

## 2022-12-07 DIAGNOSIS — G459 Transient cerebral ischemic attack, unspecified: Secondary | ICD-10-CM | POA: Diagnosis not present

## 2022-12-07 LAB — RENAL FUNCTION PANEL
Albumin: 1.9 g/dL — ABNORMAL LOW (ref 3.5–5.0)
Albumin: 1.8 g/dL — ABNORMAL LOW (ref 3.5–5.0)
Anion gap: 9 (ref 5–15)
Anion gap: 10 (ref 5–15)
BUN: 32 mg/dL — ABNORMAL HIGH (ref 8–23)
BUN: 26 mg/dL — ABNORMAL HIGH (ref 8–23)
CO2: 20 mmol/L — ABNORMAL LOW (ref 22–32)
CO2: 21 mmol/L — ABNORMAL LOW (ref 22–32)
Calcium: 8.7 mg/dL — ABNORMAL LOW (ref 8.9–10.3)
Calcium: 7.7 mg/dL — ABNORMAL LOW (ref 8.9–10.3)
Chloride: 103 mmol/L (ref 98–111)
Chloride: 101 mmol/L (ref 98–111)
Creatinine, Ser: 1.97 mg/dL — ABNORMAL HIGH (ref 0.44–1.00)
Creatinine, Ser: 1.82 mg/dL — ABNORMAL HIGH (ref 0.44–1.00)
GFR, Estimated: 26 mL/min — ABNORMAL LOW (ref >60)
GFR, Estimated: 29 mL/min — ABNORMAL LOW (ref >60)
Glucose, Bld: 149 mg/dL — ABNORMAL HIGH (ref 70–99)
Glucose, Bld: 211 mg/dL — ABNORMAL HIGH (ref 70–99)
Phosphorus: 2.5 mg/dL (ref 2.5–4.6)
Phosphorus: 1.8 mg/dL — ABNORMAL LOW (ref 2.5–4.6)
Potassium: 3.2 mmol/L — ABNORMAL LOW (ref 3.5–5.1)
Potassium: 3.9 mmol/L (ref 3.5–5.1)
Sodium: 132 mmol/L — ABNORMAL LOW (ref 135–145)
Sodium: 132 mmol/L — ABNORMAL LOW (ref 135–145)

## 2022-12-07 LAB — ECHOCARDIOGRAM COMPLETE
AR max vel: 1.74 cm2
AV Peak grad: 9.2 mmHg
Ao pk vel: 1.52 m/s
Area-P 1/2: 4.12 cm2
Height: 64 in
S' Lateral: 2.75 cm
Weight: 3072.33 [oz_av]

## 2022-12-07 LAB — CBC
HCT: 28.3 % — ABNORMAL LOW (ref 36.0–46.0)
Hemoglobin: 8.8 g/dL — ABNORMAL LOW (ref 12.0–15.0)
MCH: 25.8 pg — ABNORMAL LOW (ref 26.0–34.0)
MCHC: 31.1 g/dL (ref 30.0–36.0)
MCV: 83 fL (ref 80.0–100.0)
Platelets: 293 10*3/uL (ref 150–400)
RBC: 3.41 MIL/uL — ABNORMAL LOW (ref 3.87–5.11)
RDW: 15.5 % (ref 11.5–15.5)
WBC: 22.5 10*3/uL — ABNORMAL HIGH (ref 4.0–10.5)
nRBC: 2.1 % — ABNORMAL HIGH (ref 0.0–0.2)

## 2022-12-07 LAB — PHOSPHORUS: Phosphorus: 1.8 mg/dL — ABNORMAL LOW (ref 2.5–4.6)

## 2022-12-07 LAB — LIPID PANEL
Cholesterol: 128 mg/dL (ref 0–200)
HDL: 18 mg/dL — ABNORMAL LOW (ref >40)
LDL Cholesterol: 77 mg/dL (ref 0–99)
Total CHOL/HDL Ratio: 7.1 RATIO
Triglycerides: 167 mg/dL — ABNORMAL HIGH (ref <150)
VLDL: 33 mg/dL (ref 0–40)

## 2022-12-07 LAB — HEMOGLOBIN A1C
Hgb A1c MFr Bld: 5.2 % (ref 4.8–5.6)
Mean Plasma Glucose: 102.54 mg/dL

## 2022-12-07 LAB — GLUCOSE, CAPILLARY
Glucose-Capillary: 151 mg/dL — ABNORMAL HIGH (ref 70–99)
Glucose-Capillary: 152 mg/dL — ABNORMAL HIGH (ref 70–99)
Glucose-Capillary: 157 mg/dL — ABNORMAL HIGH (ref 70–99)
Glucose-Capillary: 162 mg/dL — ABNORMAL HIGH (ref 70–99)
Glucose-Capillary: 164 mg/dL — ABNORMAL HIGH (ref 70–99)
Glucose-Capillary: 195 mg/dL — ABNORMAL HIGH (ref 70–99)

## 2022-12-07 LAB — PROCALCITONIN: Procalcitonin: 1.51 ng/mL

## 2022-12-07 LAB — MAGNESIUM
Magnesium: 1.9 mg/dL (ref 1.7–2.4)
Magnesium: 2.2 mg/dL (ref 1.7–2.4)

## 2022-12-07 LAB — APTT: aPTT: 44 s — ABNORMAL HIGH (ref 24–36)

## 2022-12-07 MED ORDER — PRISMASOL BGK 4/2.5 32-4-2.5 MEQ/L EC SOLN: Status: DC

## 2022-12-07 MED ORDER — HYDROCORTISONE SOD SUC (PF) 100 MG IJ SOLR
100.0000 mg | Freq: Four times a day (QID) | INTRAMUSCULAR | Status: DC
  Administered 2022-12-07 – 2022-12-08 (×4): 100 mg via INTRAVENOUS
  Filled 2022-12-07 (×4): qty 2

## 2022-12-07 MED ORDER — DROXIDOPA 100 MG PO CAPS
300.0000 mg | ORAL_CAPSULE | Freq: Three times a day (TID) | ORAL | Status: AC
  Filled 2022-12-07 (×3): qty 3

## 2022-12-07 MED ORDER — PRISMASOL BGK 4/2.5 32-4-2.5 MEQ/L REPLACEMENT SOLN
Status: DC
  Filled 2022-12-07 (×2): qty 5000

## 2022-12-07 MED ORDER — VITAL 1.5 CAL PO LIQD: 1000.0000 mL | ORAL | Status: DC

## 2022-12-07 MED ORDER — NOREPINEPHRINE 16 MG/250ML-% IV SOLN
0.0000 ug/min | INTRAVENOUS | Status: DC
  Administered 2022-12-07: 15 ug/min via INTRAVENOUS
  Filled 2022-12-07: qty 250

## 2022-12-07 MED ORDER — ATORVASTATIN CALCIUM 20 MG PO TABS
40.0000 mg | ORAL_TABLET | Freq: Every day | ORAL | Status: DC
  Administered 2022-12-08 – 2023-01-03 (×26): 40 mg
  Filled 2022-12-07 (×26): qty 2

## 2022-12-07 MED ORDER — HYDROXYZINE HCL 25 MG PO TABS
50.0000 mg | ORAL_TABLET | Freq: Two times a day (BID) | ORAL | Status: DC
  Administered 2022-12-07: 50 mg via ORAL
  Filled 2022-12-07: qty 2

## 2022-12-07 MED ORDER — ALTEPLASE 2 MG IJ SOLR
2.0000 mg | Freq: Once | INTRAMUSCULAR | Status: AC
  Administered 2022-12-07: 2 mg
  Filled 2022-12-07: qty 2

## 2022-12-07 MED ORDER — PERFLUTREN LIPID MICROSPHERE
1.0000 mL | INTRAVENOUS | Status: AC | PRN
  Administered 2022-12-07: 5 mL via INTRAVENOUS

## 2022-12-07 MED ORDER — POTASSIUM CHLORIDE CRYS ER 20 MEQ PO TBCR
20.0000 meq | EXTENDED_RELEASE_TABLET | Freq: Once | ORAL | Status: AC
  Administered 2022-12-07: 20 meq via ORAL
  Filled 2022-12-07: qty 1

## 2022-12-07 MED ORDER — PIVOT 1.5 CAL PO LIQD: 1000.0000 mL | ORAL | Status: DC

## 2022-12-07 MED ORDER — PROSOURCE TF20 ENFIT COMPATIBL EN LIQD
60.0000 mL | Freq: Every day | ENTERAL | Status: DC
  Administered 2022-12-07 – 2022-12-26 (×20): 60 mL
  Filled 2022-12-07 (×20): qty 60

## 2022-12-07 NOTE — Progress Notes (Signed)
    72 year old with multiple medical problems admitted with MRSA bacteremia and septic shock with possible source being HD catheter that has been removed.  Mains critically ill in the intensive care unit and on pressors and CVVHD.  I have repeated her blood cultures today but as a mention previously she will need a central line holiday.  I would not be in favor of anticoagulating her since there is a good chance she does have carditis.  Dr. Rivka Safer will see tomorrow.

## 2022-12-07 NOTE — Consult Note (Signed)
Neurology Consultation Reason for Consult: Left-sided weakness Requesting Physician: Erin Fulling   CC: Lethargy   History is obtained from: Family at bedside, chart review   HPI: Alexandra Foster is a 72 y.o. 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  By Thursday she was quite lethargic and for this reason she was brought to the ED for evaluation.  Code stroke was initially activated due to concern for left-sided weakness.  She has been found to have MRSA and Staph epidermidis bacteremia with source presumed to be her dialysis catheter.   Inpatient neurology is asked to follow-up and family at bedside notes that patient is at her neurological baseline at this time.    Of note she had a very complicated and long admission in early 2023 with sepsis secondary to left lower extremity cellulitis which ultimately was complicated by necrotizing fasciitis and amputation was required.  During this admission: 1) she did have paroxysmal atrial fibrillation in the setting of septic shock and was started on heparin drip but this was discontinued due to MRI revealing a small subarachnoid hemorrhage overlying the left middle frontal sulcus felt to be related to critical illness (swings in blood pressure or alterations of vascular permeability during sepsis; no aneurysm identified on MRA).  CHA2DS2-VASc score was noted to be 4 by cardiology 2) She also had some concern for seizure-like activity felt to be possibly  related to anoxic brain injury versus seizure activity for which she was on Keppra.  Family is uncertain when this medication was discontinued but she has not had any further concern for seizure. 3) She ultimately had tracheostomy and PEG tube placed.  However her tracheostomy was weaned and she is now on room air.  She is bedbound at baseline but conversant, not always fully oriented but typically aware of month and age.  Family notes that since early 2023 she has had baseline left-sided weakness 4) Family did not recall being informed of IVC placed during last admission (05/2021, still in place as per abdominal x-ray on 10/13/2022) 5) Regarding her pancreatic head lesion, this was noted incidentally on imaging in March 2023 with recommendation for outpatient MRI scan with contrast  LKW: Wednesday Thrombolytic given?:  No out of the window IA performed?: No, exam not consistent with LVO Premorbid modified rankin scale:      5 - Severe disability. Requires constant nursing care and attention, bedridden  ROS: All other review of systems was negative except as noted in the HPI.   Past Medical History:  Diagnosis Date   AKI (acute kidney injury) (HCC)    a. 04/2021 in setting of bacteremia/shock.   Arthritis    Asthma    Bacteremia    a. 04/2021 S pyogenes bacteremia in setting of lower ext cellulitis.   COPD (chronic obstructive pulmonary disease) (HCC)    Diabetes mellitus without complication (HCC)    Endometriosis    GERD (gastroesophageal reflux disease)    History of echocardiogram    a. 07/2013 Echo: EF 55-60%, impaired relaxation, mild TR; b. 04/2021 Echo: EF 50-55%, mild LVH, nl  RV fxn, mild BAE, Ao sclerosis w/o stenosis.   Hypertension    Obesity    PAF (paroxysmal atrial fibrillation) (HCC)    a. 04/2021 in setting of septic shock/cellulitis.   Sleep apnea    CPAP   Past Surgical History:  Procedure Laterality Date   ABDOMINAL HYSTERECTOMY     APPLICATION OF WOUND VAC Left  05/17/2021   Procedure: APPLICATION OF WOUND VAC/WOUND VAC EXCHANGE-Matrix Myriad;  Surgeon: Carolan Shiver, MD;  Location: [facility];  Service: General;  Laterality: Left;   APPLICATION OF WOUND VAC  05/24/2021   Procedure: APPLICATION OF WOUND VAC;  Surgeon: Carolan Shiver, MD;  Location: [facility];  Service: General;;   APPLICATION OF WOUND VAC  05/10/2021   Procedure: APPLICATION OF WOUND VAC;  Surgeon: Carolan Shiver, MD;  Location: [facility];  Service: General;;   COLONOSCOPY  10/29/2006   Dr Servando Snare   COLONOSCOPY WITH PROPOFOL N/A 11/05/2016   Procedure: COLONOSCOPY WITH PROPOFOL;  Surgeon: Earline Mayotte, MD;  Location: [facility];  Service: Endoscopy;  Laterality: N/A;   INCISION AND DRAINAGE OF WOUND Left 05/24/2021   Procedure: IRRIGATION AND DEBRIDEMENT LEFT LEG;  Surgeon: Carolan Shiver, MD;  Location: [facility];  Service: General;  Laterality: Left;   INCISION AND DRAINAGE OF WOUND Left 05/10/2021   Procedure: IRRIGATION AND DEBRIDEMENT LEFT LEG;  Surgeon: Carolan Shiver, MD;  Location: [facility];  Service: General;  Laterality: Left;   IR FLUORO GUIDE CV LINE RIGHT  06/12/2021   IR PERC TUN PERIT CATH WO PORT S&I /IMAG  06/12/2021   IR REPLC GASTRO/COLONIC TUBE PERCUT W/FLUORO  06/12/2021   IVC FILTER INSERTION N/A 05/29/2021   Procedure: IVC FILTER INSERTION;  Surgeon: Renford Dills, MD;  Location: [facility];  Service: Cardiovascular;  Laterality: N/A;   MINOR GRAFT APPLICATION  05/24/2021   Procedure: Myriad Matrix  APPLICATION;  Surgeon: Carolan Shiver, MD;  Location: [facility];  Service: General;;   NASAL SINUS SURGERY  2002   Dr Chestine Spore   PEG PLACEMENT N/A 05/28/2021   Procedure: PERCUTANEOUS ENDOSCOPIC GASTROSTOMY (PEG) PLACEMENT;  Surgeon: Sung Amabile, DO;  Location: [facility];  Service: General;  Laterality: N/A;  TRAVEL CASE   TRACHEOSTOMY TUBE PLACEMENT N/A 05/17/2021   Procedure: TRACHEOSTOMY;  Surgeon: Geanie Logan, MD;   Location: [facility];  Service: ENT;  Laterality: N/A;   WOUND DEBRIDEMENT Left 05/07/2021   Procedure: DEBRIDEMENT WOUND;  Surgeon: Carolan Shiver, MD;  Location: [facility];  Service: General;  Laterality: Left;   Current Outpatient Medications  Medication Instructions   amiodarone (PACERONE) 200 mg, Per Tube, 2 times daily   artificial tears (LACRILUBE) OINT ophthalmic ointment Both Eyes, Every 4 hours PRN   ascorbic acid (VITAMIN C) 500 mg, Per Tube, 2 times daily   busPIRone (BUSPAR) 5 mg, Oral, 2 times daily   chlorhexidine gluconate, MEDLINE KIT, (PERIDEX) 0.12 % solution 15 mLs, Mouth Rinse, 2 times daily   clonazePAM (KLONOPIN) 0.25-0.5 mg, Oral, 2 times daily, 0.25 mg every morning and 0.5 mg at bedtime for Agitation   collagenase (SANTYL) ointment Topical, Daily   dextrose 5 % SOLN 100 mL with copper chloride 0.4 MG/ML SOLN 2 mg 2 mg, Intravenous, Daily   escitalopram (LEXAPRO) 10 mg, Per Tube, Daily   hydrOXYzine (ATARAX) 100 mg, Oral, 2 times daily   ipratropium-albuterol (DUONEB) 0.5-2.5 (3) MG/3ML SOLN 3 mLs, Nebulization, 3 times daily   lactobacillus (FLORANEX/LACTINEX) PACK 1 g, Per Tube, 3 times daily with meals  lip balm (BLISTEX) OINT 1 application , Topical, 3 times daily   loperamide (IMODIUM) 2 mg, Oral, As needed   midodrine (PROAMATINE) 15 mg, Per Tube, 3 times daily with meals   multivitamin (RENA-VIT) TABS tablet 1 tablet, Per Tube, Daily at bedtime   omeprazole (PRILOSEC) 20 mg, Oral, Daily   pantoprazole sodium (PROTONIX) 40 mg, Per Tube, Daily   sennosides (SENOKOT) 8.8 MG/5ML syrup 10 mLs, Per Tube, At bedtime PRN   vitamin D3 (CHOLECALCIFEROL) 2,000 Units, Per Tube, Daily     Family History  Problem Relation Age of Onset   Congestive Heart Failure Mother    Coronary artery disease Father 96   Breast cancer Sister 77   Heart disease Brother    Varicose Veins Brother    Alcohol abuse Brother    Past Surgical History:  Procedure Laterality Date    ABDOMINAL HYSTERECTOMY     APPLICATION OF WOUND VAC Left 05/17/2021   Procedure: APPLICATION OF WOUND VAC/WOUND VAC EXCHANGE-Matrix Myriad;  Surgeon: Carolan Shiver, MD;  Location: [facility];  Service: General;  Laterality: Left;   APPLICATION OF WOUND VAC  05/24/2021   Procedure: APPLICATION OF WOUND VAC;  Surgeon: Carolan Shiver, MD;  Location: [facility];  Service: General;;   APPLICATION OF WOUND VAC  05/10/2021   Procedure: APPLICATION OF WOUND VAC;  Surgeon: Carolan Shiver, MD;  Location: [facility];  Service: General;;   COLONOSCOPY  10/29/2006   Dr Servando Snare   COLONOSCOPY WITH PROPOFOL N/A 11/05/2016   Procedure: COLONOSCOPY WITH PROPOFOL;  Surgeon: Earline Mayotte, MD;  Location: [facility];  Service: Endoscopy;  Laterality: N/A;   INCISION AND DRAINAGE OF WOUND Left 05/24/2021   Procedure: IRRIGATION AND DEBRIDEMENT LEFT LEG;  Surgeon: Carolan Shiver, MD;  Location: [facility];  Service: General;  Laterality: Left;   INCISION AND DRAINAGE OF WOUND Left 05/10/2021   Procedure: IRRIGATION AND DEBRIDEMENT LEFT LEG;  Surgeon: Carolan Shiver, MD;  Location: [facility];  Service: General;  Laterality: Left;   IR FLUORO GUIDE CV LINE RIGHT  06/12/2021   IR PERC TUN PERIT CATH WO PORT S&I /IMAG  06/12/2021   IR REPLC GASTRO/COLONIC TUBE PERCUT W/FLUORO  06/12/2021   IVC FILTER INSERTION N/A 05/29/2021   Procedure: IVC FILTER INSERTION;  Surgeon: Renford Dills, MD;  Location: [facility];  Service: Cardiovascular;  Laterality: N/A;   MINOR GRAFT APPLICATION  05/24/2021   Procedure: Myriad Matrix  APPLICATION;  Surgeon: Carolan Shiver, MD;  Location: [facility];  Service: General;;   NASAL SINUS SURGERY  2002   Dr Chestine Spore   PEG PLACEMENT N/A 05/28/2021   Procedure: PERCUTANEOUS ENDOSCOPIC GASTROSTOMY (PEG) PLACEMENT;  Surgeon: Sung Amabile, DO;  Location: [facility];  Service: General;  Laterality: N/A;  TRAVEL CASE   TRACHEOSTOMY TUBE PLACEMENT N/A  05/17/2021   Procedure: TRACHEOSTOMY;  Surgeon: Geanie Logan, MD;  Location: [facility];  Service: ENT;  Laterality: N/A;   WOUND DEBRIDEMENT Left 05/07/2021   Procedure: DEBRIDEMENT WOUND;  Surgeon: Carolan Shiver, MD;  Location: [facility];  Service: General;  Laterality: Left;     Social History:  reports that she quit smoking about 21 years ago. Her smoking use included cigarettes. She started smoking about 61 years ago. She has a 80 pack-year smoking history. She quit smokeless tobacco use about 21 years ago.  Her smokeless tobacco use included snuff. She reports that she does not drink alcohol and does not use drugs.  Exam: Current vital signs: BP 100/60  Pulse 100   Temp 99 F (37.2 C) (Oral)   Resp (!) 27   Ht 5\' 4"  (1.626 m)   Wt 87.1 kg   SpO2 99%   BMI 32.96 kg/m  Vital signs in last 24 hours: Temp:  [97.9 F (36.6 C)-101.9 F (38.8 C)] 99 F (37.2 C) (08/25 0800) Pulse Rate:  [63-133] 100 (08/25 0900) Resp:  [10-43] 27 (08/25 0900) BP: (63-157)/(26-114) 100/60 (08/25 0900) SpO2:  [94 %-100 %] 99 % (08/25 0900) Arterial Line BP: (60-134)/(35-71) 95/57 (08/25 0900) Weight:  [84.4 kg-87.1 kg] 87.1 kg (08/25 0500)   Physical Exam  Constitutional: Appears chronically ill Psych: Affect slightly irritated but cooperative Eyes: No scleral injection HENT: No oropharyngeal obstruction.  MSK: Left AKA Cardiovascular: Mild tachycardia with regular rhythm. Respiratory: Effort normal, non-labored breathing GI: Soft.  No distension. There is no tenderness.   Neuro: Mental Status: Patient is awake, alert, oriented to person, month, age, but not place, limited ability to provide history regarding situation  No signs of aphasia or neglect Cranial Nerves: II: Visual Fields are full.  III,IV, VI: EOMI without ptosis or diploplia.  V: Facial sensation is symmetric to light touch VII: Facial movement is symmetric.  VIII: hearing is intact to voice XII: tongue is midline  without atrophy or fasciculations.  Motor: Good antigravity strength in bilateral upper extremities, weaker on the left than the right with bilateral intention tremor stable from baseline, 1/5 in RLE. Left AKA Sensory: Sensation is symmetric to light touch and temperature in the arms and legs. Cerebellar: Intention tremor on finger to nose bilaterally  Gait:  Bedbound at baseline   I have reviewed labs in epic and the results pertinent to this consultation are:  Basic Metabolic Panel: Recent Labs  Lab 12/05/22 2056 12/06/22 0354 12/06/22 0404 12/06/22 1614 12/07/22 0213  NA 128* 133* 134* 132* 132*  K 4.2 4.1 4.1 3.5 3.2*  CL 90* 99 100 100 103  CO2 22 19* 23 20* 20*  GLUCOSE 197* 103* 108* 129* 149*  BUN 67* 66* 62* 54* 32*  CREATININE 4.53* 4.34* 4.23* 3.26* 1.97*  CALCIUM 9.6 8.7* 8.4* 8.0* 8.7*  MG  --   --   --   --  1.9  PHOS  --  2.8  --  2.9 2.5    CBC: Recent Labs  Lab 12/05/22 2056 12/06/22 0404 12/06/22 0734 12/06/22 1934 12/07/22 0213  WBC 21.4* 16.0* 16.1*  --  22.5*  NEUTROABS 19.4*  --   --   --   --   HGB 10.2* 8.6* 8.6* 8.2* 8.8*  HCT 33.4* 28.4* 28.1* 26.6* 28.3*  MCV 84.3 85.5 85.4  --  83.0  PLT 411* 352 377  --  293    Coagulation Studies: Recent Labs    12/05/22 January 06, 2055 12/06/22 0404  LABPROT 14.1 15.6*  INR 1.1 1.2    Lab Results  Component Value Date   HGBA1C 6.7 (H) 05/05/2021   Lab Results  Component Value Date   CHOL 106 02/11/2021   HDL 30 (L) 02/11/2021   LDLCALC 62 02/11/2021   TRIG 121 05/15/2021   CHOLHDL 3.5 02/11/2021     I have reviewed the images obtained:  12/07/22 MRI brain personally reviewed, agree with radiology 1. No evidence of an acute intracranial abnormality. 2. Minimal chronic small vessel ischemic changes within the cerebral white matter. 3. Possible small amount of chronic subarachnoid hemorrhage along the mid-to-posterior left frontal lobe, as described. 4. Mild-to-moderate generalized  cerebral atrophy. 5. Paranasal sinus disease as described.  05/16/21 ECHO, repeat echocardiogram today is pending 1. Left ventricular ejection fraction, by estimation, is 60 to 65%. The  left ventricle has normal function. The left ventricle has no regional  wall motion abnormalities. There is mild left ventricular hypertrophy.  Left ventricular diastolic parameters  are consistent with Grade I diastolic dysfunction (impaired relaxation).   2. Right ventricular systolic function is normal. The right ventricular  size is normal. There is mildly elevated pulmonary artery systolic  pressure. The estimated right ventricular systolic pressure is 44.9 mmHg.   3. The mitral valve is normal in structure. No evidence of mitral valve  regurgitation. No evidence of mitral stenosis.   4. The aortic valve is normal in structure. Aortic valve regurgitation is  not visualized. No aortic stenosis is present.   5. The inferior vena cava is normal in size with greater than 50%  respiratory variability, suggesting right atrial pressure of 3 mmHg.   Impression: Symptoms on admission most consistent with worsening of her baseline left-sided weakness is secondary to sepsis/acute illness.  However she is certainly at risk of stroke with atrial fibrillation not on anticoagulation.  Her remote small subarachnoid hemorrhage in the setting of critical illness is unlikely to be a long-term contraindication to anticoagulation but I defer to neurosurgery to confirm this.  However with MRSA bacteremia which does carry a significant risk of endocarditis, endocarditis should be definitively excluded prior to starting anticoagulation.  Additionally she seems to have had some chronic issues that have been lost to follow-up since her transition to a facility including IVC filter, pancreatic lesion, sleep apnea treatment  Recommendations:  # Possible TIA -Appreciate neurosurgery confirmation that anticoagulation can be resumed  from their perspective, secure chat sent to neurosurgeon on-call -Echocardiogram pending, may need TEE if TTE is negative to definitively exclude endocarditis (defer to primary team, cardiology and infectious disease on risk/benefit) -Anticoagulation for stroke risk prevention once endocarditis is ruled out or resolved, and after neurosurgical clearance -LDL goal less than 70, A1c goal less than 7% (A1c and lipid panel ordered), please adjust medications as needed -Carotid ultrasound -Outpatient follow-up for OSA/CPAP versus inpatient trial as this can increase stroke risk if untreated -Outpatient follow-up for pancreatic lesion as malignancy can increase stroke risk -Outpatient follow-up for IVC filter, could potentially be removed if anticoagulation is reinstituted -Inpatient neurology will follow-up carotid ultrasound but will otherwise sign off at this time. Please do not hesitate to reach out if additional questions or concerns arise -Discussed with Dr. Belia Heman in person and via secure chat  Brooke Dare MD-PhD [facility] [phone] [facility] coverage for [facility] is from 8 AM to 4 AM in-house and 4 PM to 8 PM by telephone/video. 8 PM to 8 AM emergent questions or overnight urgent questions should be addressed to Teleneurology On-call or [facility] neurohospitalist; contact information can be found on AMION  CRITICAL CARE Performed by: Gordy Councilman   Total critical care time: 60 minutes  Critical care time was exclusive of separately billable procedures and treating other patients.  Critical care was necessary to treat or prevent imminent or life-threatening deterioration.  Critical care was time spent personally by me on the following activities: development of treatment plan with patient and/or surrogate as well as nursing, discussions with consultants, evaluation of patient's response to treatment, examination of patient, obtaining history from patient or  surrogate, ordering and performing treatments and interventions, ordering and review of laboratory studies,  ordering and review of radiographic studies, pulse oximetry and re-evaluation of patient's condition.

## 2022-12-07 NOTE — Progress Notes (Signed)
  Echocardiogram 2D Echocardiogram has been performed. Definity IV ultrasound imaging agent used on this study.  Lenor Coffin 12/07/2022, 11:52 AM

## 2022-12-07 NOTE — Progress Notes (Signed)
Patient more alert tonight. She has refused cpap.  Daughter stated lasted night that patient has not used cpap at facility in years. She was willing to try last night but only for a short period before rn removed. Patient in no distress

## 2022-12-07 NOTE — Progress Notes (Signed)
[facility]  ROUNDING NOTE   Subjective:   Alexandra Foster is a 72 y.o. female with past medical history of diabetes, COPD, GERD, hypertension. OA, a fib, and end stage renal disease on hemodialysis. Patient presents to the ED from her nursing facility complaining of altered mental status. She has been admitted for Lower urinary tract infectious disease [N39.0] Altered mental status, unspecified altered mental status type [R41.82] Sepsis due to gram-negative UTI (HCC) [A41.50, N39.0] Sepsis, due to unspecified organism, unspecified whether acute organ dysfunction present (HCC) [A41.9]  Moved to ICU for hypotension. Placed on CRRT. Remains on vasopressors.   Permcath removed yesterday.   Patient more alert and able to tell me her name and place this morning.   Objective:  Vital signs in last 24 hours:  Temp:  [97.9 F (36.6 C)-101.9 F (38.8 C)] 99 F (37.2 C) (08/25 0800) Pulse Rate:  [63-133] 90 (08/25 1130) Resp:  [10-43] 22 (08/25 1130) BP: (63-116)/(26-93) 96/64 (08/25 1130) SpO2:  [94 %-100 %] 100 % (08/25 1130) Arterial Line BP: (47-134)/(35-71) 68/64 (08/25 1130) Weight:  [84.4 kg-87.1 kg] 87.1 kg (08/25 0500)  Weight change: 1.268 kg Filed Weights   12/06/22 1054 12/06/22 1215 12/07/22 0500  Weight: 86 kg 84.4 kg 87.1 kg    Intake/Output: I/O last 3 completed shifts: In: 1859.5 [I.V.:890.6; Other:185; IV Piggyback:783.9] Out: 661.7 [Urine:90]   Intake/Output this shift:  Total I/O In: 86.6 [I.V.:86.6] Out: 0   Physical Exam: General: Lethargic  Head: Normocephalic, atraumatic. Moist oral mucosal membranes  Eyes: Anicteric  Lungs:  Diminished bilaterally   Heart: Regular rate and rhythm  Abdomen:  Soft, nontender  Extremities:  1+ peripheral edema.  Neurologic: Alert to self and place, not to situation or time   Skin: No lesions  Access: Left femoral temp HD catheter.     Basic Metabolic Panel: Recent Labs  Lab 12/05/22 2056  12/06/22 0354 12/06/22 0404 12/06/22 1614 12/07/22 0213  NA 128* 133* 134* 132* 132*  K 4.2 4.1 4.1 3.5 3.2*  CL 90* 99 100 100 103  CO2 22 19* 23 20* 20*  GLUCOSE 197* 103* 108* 129* 149*  BUN 67* 66* 62* 54* 32*  CREATININE 4.53* 4.34* 4.23* 3.26* 1.97*  CALCIUM 9.6 8.7* 8.4* 8.0* 8.7*  MG  --   --   --   --  1.9  PHOS  --  2.8  --  2.9 2.5    Liver Function Tests: Recent Labs  Lab 12/05/22 2056 12/06/22 0354 12/06/22 1614 12/07/22 0213  AST 66*  --   --   --   ALT 87*  --   --   --   ALKPHOS 157*  --   --   --   BILITOT 0.6  --   --   --   PROT 8.6*  --   --   --   ALBUMIN 3.3* 2.5* 2.3* 1.9*   No results for input(s): "LIPASE", "AMYLASE" in the last 168 hours. No results for input(s): "AMMONIA" in the last 168 hours.  CBC: Recent Labs  Lab 12/05/22 2056 12/06/22 0404 12/06/22 0734 12/06/22 1934 12/07/22 0213  WBC 21.4* 16.0* 16.1*  --  22.5*  NEUTROABS 19.4*  --   --   --   --   HGB 10.2* 8.6* 8.6* 8.2* 8.8*  HCT 33.4* 28.4* 28.1* 26.6* 28.3*  MCV 84.3 85.5 85.4  --  83.0  PLT 411* 352 377  --  293    Cardiac  Enzymes: No results for input(s): "CKTOTAL", "CKMB", "CKMBINDEX", "TROPONINI" in the last 168 hours.  BNP: Invalid input(s): "POCBNP"  CBG: Recent Labs  Lab 12/06/22 1916 12/06/22 2336 12/07/22 0402 12/07/22 0725 12/07/22 1143  GLUCAP 107* 134* 162* 151* 152*    Microbiology: Results for orders placed or performed during the hospital encounter of 12/05/22  Culture, blood (Routine x 2)     Status: Abnormal (Preliminary result)   Collection Time: 12/05/22  8:56 PM   Specimen: BLOOD  Result Value Ref Range Status   Specimen Description   Final    BLOOD BLOOD RIGHT ARM Performed at [facility], [address] [place], [place] [address]    Special Requests   Final    BOTTLES DRAWN AEROBIC AND ANAEROBIC Blood Culture adequate volume Performed at [facility], [address], [place], [place] [address]    Culture   Setup Time   Final    GRAM POSITIVE COCCI IN BOTH AEROBIC AND ANAEROBIC BOTTLES CRITICAL RESULT CALLED TO, READ BACK BY AND VERIFIED WITH: SHEEMA HALLLAJI AT 1206 12/06/22.PMF Performed at [facility], [address] [place], [place] [address]    Culture STAPHYLOCOCCUS AUREUS (A)  Final   Report Status PENDING  Incomplete  Culture, blood (Routine x 2)     Status: Abnormal (Preliminary result)   Collection Time: 12/05/22  8:57 PM   Specimen: BLOOD  Result Value Ref Range Status   Specimen Description   Final    BLOOD BLOOD LEFT ARM Performed at [facility], [address], [place], [place] [address]    Special Requests   Final    BOTTLES DRAWN AEROBIC AND ANAEROBIC Blood Culture adequate volume Performed at [facility], [address] [place], [place] [address]    Culture  Setup Time   Final    GRAM POSITIVE COCCI IN BOTH AEROBIC AND ANAEROBIC BOTTLES CRITICAL RESULT CALLED TO, READ BACK BY AND VERIFIED WITH: SHEEMA HALLAJI AT 1206 12/06/22.PMF    Culture (A)  Final    STAPHYLOCOCCUS AUREUS SUSCEPTIBILITIES TO FOLLOW CULTURE REINCUBATED FOR BETTER GROWTH Performed at [facility], [address] [place], [place] [address]    Report Status PENDING  Incomplete  Blood Culture ID Panel (Reflexed)     Status: Abnormal   Collection Time: 12/05/22  8:57 PM  Result Value Ref Range Status   Enterococcus faecalis NOT DETECTED NOT DETECTED Final   Enterococcus Faecium NOT DETECTED NOT DETECTED Final   Listeria monocytogenes NOT DETECTED NOT DETECTED Final   Staphylococcus species DETECTED (A) NOT DETECTED Final    Comment: CRITICAL RESULT CALLED TO, READ BACK BY AND VERIFIED WITH: SHEEMA HALLAJI AT 1206 12/06/22.PMF    Staphylococcus aureus (BCID) DETECTED (A) NOT DETECTED Final    Comment: Methicillin (oxacillin)-resistant Staphylococcus aureus (MRSA). MRSA is predictably resistant to beta-lactam antibiotics (except ceftaroline). Preferred  therapy is vancomycin unless clinically contraindicated. Patient requires contact precautions if  hospitalized. CRITICAL RESULT CALLED TO, READ BACK BY AND VERIFIED WITH: SHEEMA HALLAJI AT 1206 12/06/22.PMF    Staphylococcus epidermidis DETECTED (A) NOT DETECTED Final    Comment: CRITICAL RESULT CALLED TO, READ BACK BY AND VERIFIED WITH: SHEEMA HALLAJI AT 1206 12/06/22.PMF    Staphylococcus lugdunensis NOT DETECTED NOT DETECTED Final   Streptococcus species NOT DETECTED NOT DETECTED Final   Streptococcus agalactiae NOT DETECTED NOT DETECTED Final   Streptococcus pneumoniae NOT DETECTED NOT DETECTED Final   Streptococcus pyogenes NOT DETECTED NOT DETECTED Final   A.calcoaceticus-baumannii  NOT DETECTED NOT DETECTED Final   Bacteroides fragilis NOT DETECTED NOT DETECTED Final   Enterobacterales NOT DETECTED NOT DETECTED Final   Enterobacter cloacae complex NOT DETECTED NOT DETECTED Final   Escherichia coli NOT DETECTED NOT DETECTED Final   Klebsiella aerogenes NOT DETECTED NOT DETECTED Final   Klebsiella oxytoca NOT DETECTED NOT DETECTED Final   Klebsiella pneumoniae NOT DETECTED NOT DETECTED Final   Proteus species NOT DETECTED NOT DETECTED Final   Salmonella species NOT DETECTED NOT DETECTED Final   Serratia marcescens NOT DETECTED NOT DETECTED Final   Haemophilus influenzae NOT DETECTED NOT DETECTED Final   Neisseria meningitidis NOT DETECTED NOT DETECTED Final   Pseudomonas aeruginosa NOT DETECTED NOT DETECTED Final   Stenotrophomonas maltophilia NOT DETECTED NOT DETECTED Final   Candida albicans NOT DETECTED NOT DETECTED Final   Candida auris NOT DETECTED NOT DETECTED Final   Candida glabrata NOT DETECTED NOT DETECTED Final   Candida krusei NOT DETECTED NOT DETECTED Final   Candida parapsilosis NOT DETECTED NOT DETECTED Final   Candida tropicalis NOT DETECTED NOT DETECTED Final   Cryptococcus neoformans/gattii NOT DETECTED NOT DETECTED Final   Methicillin resistance mecA/C  DETECTED (A) NOT DETECTED Final    Comment: CRITICAL RESULT CALLED TO, READ BACK BY AND VERIFIED WITH: SHEEMA HALLAJI AT 1206 12/06/22.PMF    Meth resistant mecA/C and MREJ DETECTED (A) NOT DETECTED Final    Comment: CRITICAL RESULT CALLED TO, READ BACK BY AND VERIFIED WITH: SHEEMA HALLAJI AT 1206 12/06/22.PMF Performed at [facility], [address] [place], [place] [address]   Resp panel by RT-PCR (RSV, Flu A&B, Covid) Anterior Nasal Swab     Status: None   Collection Time: 12/05/22  9:53 PM   Specimen: Anterior Nasal Swab  Result Value Ref Range Status   SARS Coronavirus 2 by RT PCR NEGATIVE NEGATIVE Final    Comment: (NOTE) SARS-CoV-2 target nucleic acids are NOT DETECTED.  The SARS-CoV-2 RNA is generally detectable in upper respiratory specimens during the acute phase of infection. The lowest concentration of SARS-CoV-2 viral copies this assay can detect is 138 copies/mL. A negative result does not preclude SARS-Cov-2 infection and should not be used as the sole basis for treatment or other patient management decisions. A negative result may occur with  improper specimen collection/handling, submission of specimen other than nasopharyngeal swab, presence of viral mutation(s) within the areas targeted by this assay, and inadequate number of viral copies(<138 copies/mL). A negative result must be combined with clinical observations, patient history, and epidemiological information. The expected result is Negative.  Fact Sheet for Patients:  BloggerCourse.com  Fact Sheet for Healthcare Providers:  SeriousBroker.it  This test is no t yet approved or cleared by the [place] FDA and  has been authorized for detection and/or diagnosis of SARS-CoV-2 by FDA under an Emergency Use Authorization (EUA). This EUA will remain  in effect (meaning this test can be used) for the duration of the COVID-19 declaration under  Section 564(b)(1) of the Act, 21 U.S.C.section 360bbb-3(b)(1), unless the authorization is terminated  or revoked sooner.       Influenza A by PCR NEGATIVE NEGATIVE Final   Influenza B by PCR NEGATIVE NEGATIVE Final    Comment: (NOTE) The Xpert Xpress SARS-CoV-2/FLU/RSV plus assay is intended as an aid in the diagnosis of influenza from Nasopharyngeal swab specimens and should not be used as a sole basis for treatment. Nasal washings and aspirates are unacceptable for Xpert Xpress SARS-CoV-2/FLU/RSV testing.  Fact Sheet for  Patients: BloggerCourse.com  Fact Sheet for Healthcare Providers: SeriousBroker.it  This test is not yet approved or cleared by the [place] FDA and has been authorized for detection and/or diagnosis of SARS-CoV-2 by FDA under an Emergency Use Authorization (EUA). This EUA will remain in effect (meaning this test can be used) for the duration of the COVID-19 declaration under Section 564(b)(1) of the Act, 21 U.S.C. section 360bbb-3(b)(1), unless the authorization is terminated or revoked.     Resp Syncytial Virus by PCR NEGATIVE NEGATIVE Final    Comment: (NOTE) Fact Sheet for Patients: BloggerCourse.com  Fact Sheet for Healthcare Providers: SeriousBroker.it  This test is not yet approved or cleared by the [place] FDA and has been authorized for detection and/or diagnosis of SARS-CoV-2 by FDA under an Emergency Use Authorization (EUA). This EUA will remain in effect (meaning this test can be used) for the duration of the COVID-19 declaration under Section 564(b)(1) of the Act, 21 U.S.C. section 360bbb-3(b)(1), unless the authorization is terminated or revoked.  Performed at [facility], [address] [place], [place] [address]   MRSA Next Gen by PCR, Nasal     Status: Abnormal   Collection Time: 12/06/22  4:27 AM   Specimen:  Nasal Mucosa; Nasal Swab  Result Value Ref Range Status   MRSA by PCR Next Gen DETECTED (A) NOT DETECTED Final    Comment: RESULT CALLED TO, READ BACK BY AND VERIFIED WITH: KRISTINE CHAMBERS AT 4782 12/06/22.PMF (NOTE) The GeneXpert MRSA Assay (FDA approved for NASAL specimens only), is one component of a comprehensive MRSA colonization surveillance program. It is not intended to diagnose MRSA infection nor to guide or monitor treatment for MRSA infections. Test performance is not FDA approved in patients less than 75 years old. Performed at [facility], [address] [place], [place] [address]     Coagulation Studies: Recent Labs    12/05/22 12/17/54 12/06/22 0404  LABPROT 14.1 15.6*  INR 1.1 1.2    Urinalysis: Recent Labs    12/05/22 2350  COLORURINE YELLOW*  LABSPEC 1.021  PHURINE 5.0  GLUCOSEU NEGATIVE  HGBUR NEGATIVE  BILIRUBINUR SMALL*  KETONESUR NEGATIVE  PROTEINUR 100*  NITRITE NEGATIVE  LEUKOCYTESUR LARGE*      Imaging: MR BRAIN WO CONTRAST  Result Date: 12/07/2022 CLINICAL DATA:  Provided history: Neuro deficit, acute, stroke suspected. Additional history provided: Altered mental status, lethargy, decreased responsiveness. EXAM: MRI HEAD WITHOUT CONTRAST TECHNIQUE: Multiplanar, multiecho pulse sequences of the brain and surrounding structures were obtained without intravenous contrast. COMPARISON:  Non-contrast head CT 12/05/2022. FINDINGS: Brain: Mild-to-moderate generalized cerebral atrophy. Multifocal T2 FLAIR hyperintense signal abnormality within the cerebral white matter, nonspecific but compatible with minimal chronic small vessel ischemic disease. Curvilinear susceptibility weighted-signal loss within a sulcus along the mid-to-posterior left frontal lobe. This may reflect a vessel or a small amount of chronic subarachnoid hemorrhage. Expanded and partially empty sella turcica. There is no acute infarct. No evidence of an intracranial mass. No  extra-axial fluid collection. No midline shift. Vascular: Maintained flow voids within the proximal large arterial vessels. Possible left frontal lobe developmental venous anomaly (anatomic variant). Skull and upper cervical spine: No focal suspicious marrow lesion. Sinuses/Orbits: No mass or acute finding within the imaged orbits. Postsurgical appearance of the paranasal sinuses. Frothy secretions, and moderate background mucosal thickening, within the right sphenoid sinus. Moderate mucosal thickening within the left sphenoid sinus. Scattered opacification of bilateral ethmoid air cells, overall mild-to-moderate in severity. Mild mucosal thickening within the bilateral  frontal sinuses. Other: Trace fluid within the bilateral mastoid air cells. Other: 7 mm Tornwaldt cyst within the midline posterior nasopharynx. IMPRESSION: 1. No evidence of an acute intracranial abnormality. 2. Minimal chronic small vessel ischemic changes within the cerebral white matter. 3. Possible small amount of chronic subarachnoid hemorrhage along the mid-to-posterior left frontal lobe, as described. 4. Mild-to-moderate generalized cerebral atrophy. 5. Paranasal sinus disease as described. Electronically Signed   By: Jackey Loge D.O.   On: 12/07/2022 10:18   CT HEAD CODE STROKE WO CONTRAST  Result Date: 12/05/2022 CLINICAL DATA:  Code stroke.  Right facial droop and slurred speech EXAM: CT HEAD WITHOUT CONTRAST TECHNIQUE: Contiguous axial images were obtained from the base of the skull through the vertex without intravenous contrast. RADIATION DOSE REDUCTION: This exam was performed according to the departmental dose-optimization program which includes automated exposure control, adjustment of the mA and/or kV according to patient size and/or use of iterative reconstruction technique. COMPARISON:  None Available. FINDINGS: Brain: There is no mass, hemorrhage or extra-axial collection. The size and configuration of the ventricles and  extra-axial CSF spaces are normal. The brain parenchyma is normal, without evidence of acute or chronic infarction. Vascular: No abnormal hyperdensity of the major intracranial arteries or dural venous sinuses. No intracranial atherosclerosis. Skull: The visualized skull base, calvarium and extracranial soft tissues are normal. Sinuses/Orbits: No fluid levels or advanced mucosal thickening of the visualized paranasal sinuses. No mastoid or middle ear effusion. The orbits are normal. ASPECTS [facility] Stroke Program Early CT Score) - Ganglionic level infarction (caudate, lentiform nuclei, internal capsule, insula, M1-M3 cortex): 7 - Supraganglionic infarction (M4-M6 cortex): 3 Total score (0-10 with 10 being normal): 10 IMPRESSION: 1. No acute intracranial abnormality. 2. ASPECTS is 10. These results were called by telephone at the time of interpretation on 12/05/2022 at 9:39 pm to provider [facility] , who verbally acknowledged these results. Electronically Signed   By: Deatra Robinson M.D.   On: 12/05/2022 21:39   DG Chest Port 1 View  Result Date: 12/05/2022 CLINICAL DATA:  Chest pain EXAM: PORTABLE CHEST 1 VIEW COMPARISON:  06/10/2021 FINDINGS: Cardiac shadow is mildly enlarged. Dialysis catheter is noted on the right new from the prior exam. Lungs are clear bilaterally. Mild central vascular prominence is noted likely related to volume overload. No effusion is seen. No bony abnormality is noted. IMPRESSION: Mild vascular congestion likely related to volume overload. Electronically Signed   By: Alcide Clever M.D.   On: 12/05/2022 21:21     Medications:     prismasol BGK 4/2.5 400 mL/hr at 12/07/22 1119    prismasol BGK 4/2.5 400 mL/hr at 12/07/22 1119   sodium chloride Stopped (12/07/22 0210)   ceFEPime (MAXIPIME) IV 2 g (12/07/22 0209)   heparin 10,000 units/ 20 mL infusion syringe 1,000 Units/hr (12/07/22 1118)   norepinephrine (LEVOPHED) Adult infusion 15 mcg/min (12/07/22 1119)   prismasol BGK  4/2.5 2,000 mL/hr at 12/07/22 1120   vancomycin      amiodarone  200 mg Per Tube BID   ascorbic acid  500 mg Per Tube BID   [START ON 12/08/2022] atorvastatin  40 mg Per Tube Daily   chlorhexidine  15 mL Mouth Rinse BID   Chlorhexidine Gluconate Cloth  6 each Topical Q0600   Chlorhexidine Gluconate Cloth  6 each Topical Q0600   vitamin D3  2,000 Units Per Tube Daily   droxidopa  300 mg Oral TID WC   escitalopram  10 mg Per Tube Daily  famotidine  40 mg Per Tube Daily   heparin injection (subcutaneous)  5,000 Units Subcutaneous Q8H   hydrocortisone sod succinate (SOLU-CORTEF) inj  100 mg Intravenous Q6H   lactobacillus  1 g Per Tube TID WC   midodrine  15 mg Per Tube TID WC   multivitamin  1 tablet Per Tube QHS   mupirocin ointment   Nasal BID   sodium chloride, acetaminophen **OR** acetaminophen, artificial tears, heparin, heparin, ipratropium-albuterol, loperamide HCl, magnesium hydroxide, ondansetron **OR** ondansetron (ZOFRAN) IV, mouth rinse, sennosides, sodium chloride, traZODone  Assessment/ Plan:  [name] is a 72 y.o.  female with past medical history of diabetes, COPD, GERD, hypertension. OA, a fib, and end stage renal disease on hemodialysis. Patient presents to the ED from her nursing facility complaining of altered mental status. She has been admitted for Lower urinary tract infectious disease [N39.0] Altered mental status, unspecified altered mental status type [R41.82] Sepsis due to gram-negative UTI (HCC) [A41.50, N39.0] Sepsis, due to unspecified organism, unspecified whether acute organ dysfunction present (HCC) [A41.9]  [facility]/TTS/Rt Permcath/81.3kg  End stage renal disease - CVVHDF with no ultrafiltration.   2. Hypotension due to sepsis. Blood cultures with Staph aureus.  - continue vasopressors.   - continue antibiotics.   3. Anemia of chronic kidney disease  Lab Results  Component Value Date   HGB 8.8 (L) 12/07/2022     -  continue to monitor.   4. Secondary Hyperparathyroidism:    Lab Results  Component Value Date   CALCIUM 8.7 (L) 12/07/2022   PHOS 2.5 12/07/2022     - continue to monitor, replace phosphorus as needed.     LOS: 1 Islay Polanco 8/25/202411:52 AM

## 2022-12-07 NOTE — Progress Notes (Signed)
PHARMACY CONSULT NOTE  Pharmacy Consult for Electrolyte Monitoring and Replacement   Recent Labs: Potassium (mmol/L)  Date Value  12/07/2022 3.2 (L)  08/11/2013 3.3 (L)   Magnesium (mg/dL)  Date Value  86/57/8469 1.9   Calcium (mg/dL)  Date Value  62/95/2841 8.7 (L)   Calcium, Total (mg/dL)  Date Value  32/44/0102 9.2   Albumin (g/dL)  Date Value  72/53/6644 1.9 (L)  08/11/2013 3.1 (L)   Phosphorus (mg/dL)  Date Value  03/47/4259 2.5   Sodium (mmol/L)  Date Value  12/07/2022 132 (L)  08/11/2013 138     Assessment: 72 y.o. female with medical history significant for asthma, COPD, type diabetes mellitus, s/p G-tube, GERD, ESRD on HD, hypertension, osteoarthritis, paroxysmal atrial fibrillation and OSA on CPAP, who presented to the emergency room with acute onset of altered mental status with lethargy and decreased responsiveness now on CRRT  Goal of Therapy:  Electrolytes WNL  Plan:  ---20 mEq KCl per tube x 1 ---renal function panel BID while on CRRT per protocol  Lowella Bandy ,PharmD Clinical Pharmacist 12/07/2022 7:04 AM

## 2022-12-07 NOTE — Progress Notes (Addendum)
Initial Nutrition Assessment  DOCUMENTATION CODES:   Obesity unspecified  INTERVENTION:  - Initiate Vital1.5 @ 15 mL/hr and advance by 10 mL/hr Q8H to goal rate of 22ml/hr +TF 20 PS  - Free water flushes 30ml q4 hours to maintain tube patency  - Regimen provides 2420kcal/day, 125 g/day protein and 1190 ml/day of free water   - Continue Heart Healthy diet, Thin liquids.   NUTRITION DIAGNOSIS:   Increased nutrient needs related to wound healing as evidenced by estimated needs.  GOAL:   Patient will meet greater than or equal to 90% of their needs  MONITOR:   PO intake, TF tolerance  REASON FOR ASSESSMENT:   Consult Assessment of nutrition requirement/status  ASSESSMENT:   72 y.o. female admits related to AMS. PMH includes: AKI, arthritis, asthma, bacteremia, COPD, DM, endometriosis, GERD, HTN, PAF, sleep apnea. Pt is currently receiving medical management related to septic shock.  Meds reviewed: Vit C, Vit D3, pepcid, lactobacillus, rena-vit, klor-con. Labs reviewed: Na low, K low, BUN/Creatinine elevated. IVF: levophed.   RD unable to reach pt via phone. Pt is currently Oriented x2. Pt with a Gtube in place. Pt is on a Heart Healthy diet. Spoke with RN who reports that the pt has been too lethargic too tolerate PO. MD placed consult to initiate EN via PEG tube. RD will initiate TF with slow advancement rate to goal rate. Will continue to monitor PO intakes and adjust TF regimen as needed.   NUTRITION - FOCUSED PHYSICAL EXAM:  Remote assessment.  Diet Order:   Diet Order             Diet Heart Room service appropriate? Yes; Fluid consistency: Thin  Diet effective now                   EDUCATION NEEDS:   Not appropriate for education at this time  Skin:  Skin Assessment: Skin Integrity Issues: Skin Integrity Issues:: Stage II Stage II: L/R buttocks  Last BM:  PTA  Height:   Ht Readings from Last 1 Encounters:  12/05/22 5\' 4"  (1.626 m)    Weight:    Wt Readings from Last 1 Encounters:  12/07/22 87.1 kg    Ideal Body Weight:     BMI:  Body mass index is 32.96 kg/m.  Estimated Nutritional Needs:   Kcal:  2100-2600 kcals  Protein:  105-130 gm  Fluid:  >/= 2.1 L  Bethann Humble, RD, LDN, CNSC.

## 2022-12-07 NOTE — Progress Notes (Addendum)
Patients daughter states that tube feeds at patients facility only run from 10 pm to 6 am due to patient not being able to tolerate large volumes of tube feeds throughout the day.  Patients daughter states that patient will vomit if she receives too much tube feed volume. Night shift nurse and Dr. Belia Heman made aware.

## 2022-12-07 NOTE — Progress Notes (Signed)
NAME:  Alexandra Foster, MRN:  [id number], DOB:  Apr 11, 1951, LOS: 1 ADMISSION DATE:  12/05/2022, CONSULTATION DATE:  12/06/2022 REFERRING MD:  Dr. Wynelle Link, CHIEF COMPLAINT:  Hypotension   Brief Pt Description / Synopsis:  72 y.o female with PMHx significant for ESRD on HD admitted with Acute Metabolic Encephalopathy in setting of Severe Sepsis with Septic Shock due to MRSA & Staph Epi Bacteremia (suspect from Right chest Permcath) along with UTI.  History of Present Illness:  Alexandra Foster is a 72 y.o. female with past medical history significant for asthma, COPD, type diabetes mellitus, s/p G-tube, GERD, ESRD on HD, hypertension, osteoarthritis, paroxysmal atrial fibrillation and OSA on CPAP, who presented to [facility] ED on 12/05/22 from [facility] due to altered mental status with lethargy and decreased responsiveness.  There was a concern about aspiration pneumonia at his facility as the patient had an episode of nausea and vomiting and was reportedly coughing per her sister without wheezing or dyspnea or hypoxia.  The patient's temperature was 102.2 when EMS arrived to this facility.  The patient was not responding to questions throughout transport.  EMS noted the patient having slurred speech and significant weakness per family (family had not seen her since last week).  Code stroke was called upon arrival to the ED. teleneurology evaluated the patient, was not considered a candidate for thrombolytics given unclear last known well time, along with low suspicion for stroke given significantly global symptoms and multiple metabolic derangements and sepsis contributing to encephalopathy.  ED Course: Initial Vital Signs: Temperature 101.4 F orally, pulse 78, respiratory rate 19, blood pressure 125/70, SpO2 99% on room air Significant Labs: hyponatremia 128 and hypochloremia of 90 with blood glucose of 197, BUN of 67 and creatinine 4.53 with CO2 of 22 and anion gap 16. Alk phos was 157 and  albumin 3.3, AST 66 and ALT 87 with total obtained of 8.6. Lactic acid was 2.3 and later 2.9. CBC showed leukocytosis of 21.4 with neutrophilia and anemia. Respiratory panel including influenza A, B, RSV and COVID-19 PCR came back negative. UA was positive for UTI alcohol level was less than 10. Urine drug screen was positive for tricyclic's.  Imaging Chest X-ray>>IMPRESSION: Mild vascular congestion likely related to volume overload. CT Head w/o contrast>>IMPRESSION: 1. No acute intracranial abnormality. 2. ASPECTS is 10. Medications Administered: IV Rocephin and Zithromax as well as 3 L bolus of IV normal saline   Hospitalist were asked to admit for further workup and treatment.  Nephrology consulted for hemodialysis.  Please see "significant hospital events" section below for full detailed hospital course.  Pertinent  Medical History   Past Medical History:  Diagnosis Date   AKI (acute kidney injury) (HCC)    a. 04/2021 in setting of bacteremia/shock.   Arthritis    Asthma    Bacteremia    a. 04/2021 S pyogenes bacteremia in setting of lower ext cellulitis.   COPD (chronic obstructive pulmonary disease) (HCC)    Diabetes mellitus without complication (HCC)    Endometriosis    GERD (gastroesophageal reflux disease)    History of echocardiogram    a. 07/2013 Echo: EF 55-60%, impaired relaxation, mild TR; b. 04/2021 Echo: EF 50-55%, mild LVH, nl RV fxn, mild BAE, Ao sclerosis w/o stenosis.   Hypertension    Obesity    PAF (paroxysmal atrial fibrillation) (HCC)    a. 04/2021 in setting of septic shock/cellulitis.   Sleep apnea    CPAP    Micro Data:  8/23: SARS-CoV-2/RSV/Flu PCR>>negative 8/23: Blood cultures>> 4/4 bottles with MRSA & Staph epidermidis 8/23: MRSA PCR>> positive 8/23: Urine>> 8/24: Permcath tip>>  Antimicrobials:   Anti-infectives (From admission, onward)    Start     Dose/Rate Route Frequency Ordered Stop   12/07/22 1400  vancomycin (VANCOCIN) IVPB 1000  mg/200 mL premix        1,000 mg 200 mL/hr over 60 Minutes Intravenous Every 24 hours 12/06/22 1234     12/06/22 2200  cefTRIAXone (ROCEPHIN) 2 g in sodium chloride 0.9 % 100 mL IVPB  Status:  Discontinued        2 g 200 mL/hr over 30 Minutes Intravenous Every 24 hours 12/06/22 0132 12/06/22 1223   12/06/22 1500  ceFEPIme (MAXIPIME) 2 g in sodium chloride 0.9 % 100 mL IVPB        2 g 200 mL/hr over 30 Minutes Intravenous Every 12 hours 12/06/22 1252     12/06/22 1315  vancomycin (VANCOREADY) IVPB 1750 mg/350 mL        1,750 mg 175 mL/hr over 120 Minutes Intravenous  Once 12/06/22 1224 12/06/22 1724   12/05/22 2130  cefTRIAXone (ROCEPHIN) 2 g in sodium chloride 0.9 % 100 mL IVPB  Status:  Discontinued        2 g 200 mL/hr over 30 Minutes Intravenous Every 24 hours 12/05/22 2115 12/06/22 0202   12/05/22 2130  azithromycin (ZITHROMAX) 500 mg in sodium chloride 0.9 % 250 mL IVPB  Status:  Discontinued        500 mg 250 mL/hr over 60 Minutes Intravenous Every 24 hours 12/05/22 2115 12/06/22 1223        Significant Hospital Events: Including procedures, antibiotic start and stop dates in addition to other pertinent events   8/23: Presented from [facility] due to AMS, Code Stroke called due to slurred speech and weakness. Deemed not a candidate for thrombolytics due to no clear last known normal time.  Admitted by [facility] with Nephrology consultation for dialysis. 8/24: Rapid response called due to Hypotension prior to/during Hemodialysis.  Transfer to ICU for peripheral vasopressors and initiation of CRRT.  PCCM consulted to assist with vasopressors.  Blood cultures + for MRSA & Staph Epi. ID consulted. Vascular Surgery consulted, permcath removed.  Temporary HD catheter placed. 8/24: this morning with pronounce left facial droop and dysarthria, remains on CRRT on Levo @8  keep MAP>55  Interim History / Subjective:  -See significant events  Objective   Blood pressure 90/63, pulse 95,  temperature 98.6 F (37 C), temperature source Oral, resp. rate (!) 24, height 5\' 4"  (1.626 m), weight 87.1 kg, SpO2 100%.        Intake/Output Summary (Last 24 hours) at 12/07/2022 0102 Last data filed at 12/07/2022 0600 Gross per 24 hour  Intake 1822.23 ml  Output 661.7 ml  Net 1160.53 ml   Filed Weights   12/06/22 1054 12/06/22 1215 12/07/22 0500  Weight: 86 kg 84.4 kg 87.1 kg    Examination: General: Acute on chronically ill-appearing obese female, laying in bed, on room air, no acute distress HENT: Atraumatic, normocephalic, neck supple, difficult to assess JVD due to body habitus Lungs: Diminished breath sounds throughout, even, nonlabored, normal effort Cardiovascular: Regular rate and rhythm, S1-S2, no murmurs, rubs, gallops Abdomen: Obese, soft, nontender, nondistended, no guarding or rebound tenderness, bowel sounds positive x 4 Extremities: Prior left AKA, 1+ edema to right lower extremity, no cyanosis Neuro: Lethargic, arouses easily to voice, oriented to person and place, moves extremities  to command (left side slightly weaker, mild to moderate dysarthria, left facial droop GU: Incontinent of urine  Resolved Hospital Problem list     Assessment & Plan:   #Septic Shock PMHx: Paroxsymal Atrial Fibrillation, HTN Echocardiogram 05/16/21: LVEF 60-65%, mild LVH, Grade I DD, normal RV systolic function, mildly elevated pulmonary artery systolic pressure -Continuous cardiac monitoring -Maintain MAP >65 -Cautious IV fluids due to ESRD -Vasopressors as needed to maintain MAP goal -Continue Midodrine -Trend lactic acid until normalized -Trend HS Troponin until peaked -Cortisol is 35.9 -Echocardiogram pending -Continue outpatient Amiodarone  #Severe Sepsis due to MRSA & Staph Epi Bacteremia (suspect due to Permcath) & UTI -Monitor fever curve -Trend WBC's & Procalcitonin -Follow cultures as above -continue empiric Cefepime & Vancomycin pending cultures &  sensitivities -Obtain Echocardiogram -Right chest Permcath removed  -Consult ID, appreciate input  #ESRD on Hemodialysis #Mild Hyponatremia -Monitor I&O's / urinary output -Follow BMP -Ensure adequate renal perfusion -Avoid nephrotoxic agents as able -Replace electrolytes as indicated ~ Pharmacy following for assistance with electrolyte replacement -Nephrology following, appreciate input: HD vs CRRT per recommendations  #COPD without acute exacerbation #OSA, uses CPAP at home -Supplemental O2 as needed to maintain O2 sats 88 to 92% -CPAP qhs -Follow intermittent Chest X-ray & ABG as needed -Bronchodilators prn -Pulmonary toilet as able  #Anemia of chronic disease -Monitor for S/Sx of bleeding -Trend CBC -Heparin SQ for VTE Prophylaxis  -Transfuse for Hgb <7  #Type 2 diabetes mellitus -CBG's q4h; Target range of 140 to 180 -SSI -Follow ICU Hypo/Hyperglycemia protocol -Hold outpatient metformin  #Acute Metabolic Encephalopathy in setting of sepsis and multiple metabolic derangements #?CVA PMHx: Depression and Anxiety CT Head 8/23 negative for acute intracranial abnormality Code stroke called 8/23, deemed not a candidate for thrombolytics given unclear last known well time -Treatment of Sepsis and metabolic derangements as outlined above -Provide supportive care -Promote normal sleep/wake cycle and family presence -Avoid sedating medications as able -Hold Klonopin, Buspar, Lexapro for now  Left sided weakness, Left Facial droop  -CTA head and neck reviewed and shows no acute intracranial abnormality or LVO -Check HgbA1c, fasting lipid panel -Obtain MRI  of the brain without contrast -PT consult, OT consult, Speech consult -Echocardiogram -Neurology consult placed    Patient is critically ill with bacteremia and shock.  Prognosis is guarded, high risk for further decompensation, cardiac arrest and death.  Given current illness superimposed on advanced age and  multiple chronic comorbidities, overall long-term prognosis is poor.  Recommend DNR status.   Best Practice (right click and "Reselect all SmartList Selections" daily)   Diet/type: NPO DVT prophylaxis: prophylactic heparin  GI prophylaxis: PPI Lines: right radial A-line, left femoral Trialysis (both still needed) Foley:  N/A Code Status:  full code Last date of multidisciplinary goals of care discussion [N/A]  Labs   CBC: Recent Labs  Lab 12/05/22 2056 12/06/22 0404 12/06/22 0734 12/06/22 1934 12/07/22 0213  WBC 21.4* 16.0* 16.1*  --  22.5*  NEUTROABS 19.4*  --   --   --   --   HGB 10.2* 8.6* 8.6* 8.2* 8.8*  HCT 33.4* 28.4* 28.1* 26.6* 28.3*  MCV 84.3 85.5 85.4  --  83.0  PLT 411* 352 377  --  293    Basic Metabolic Panel: Recent Labs  Lab 12/05/22 2056 12/06/22 0354 12/06/22 0404 12/06/22 1614 12/07/22 0213  NA 128* 133* 134* 132* 132*  K 4.2 4.1 4.1 3.5 3.2*  CL 90* 99 100 100 103  CO2 22 19* 23  20* 20*  GLUCOSE 197* 103* 108* 129* 149*  BUN 67* 66* 62* 54* 32*  CREATININE 4.53* 4.34* 4.23* 3.26* 1.97*  CALCIUM 9.6 8.7* 8.4* 8.0* 8.7*  MG  --   --   --   --  1.9  PHOS  --  2.8  --  2.9 2.5   GFR: Estimated Creatinine Clearance: 26.8 mL/min (A) (by C-G formula based on SCr of 1.97 mg/dL (H)). Recent Labs  Lab 12/05/22 2056 12/06/22 0016 12/06/22 0404 12/06/22 0734 12/06/22 0735 12/06/22 1257 12/07/22 0213  PROCALCITON  --   --  2.01  --   --   --  1.51  WBC 21.4*  --  16.0* 16.1*  --   --  22.5*  LATICACIDVEN 2.3* 2.9* 2.0*  --  1.9 1.7  --     Liver Function Tests: Recent Labs  Lab 12/05/22 2056 12/06/22 0354 12/06/22 1614 12/07/22 0213  AST 66*  --   --   --   ALT 87*  --   --   --   ALKPHOS 157*  --   --   --   BILITOT 0.6  --   --   --   PROT 8.6*  --   --   --   ALBUMIN 3.3* 2.5* 2.3* 1.9*   No results for input(s): "LIPASE", "AMYLASE" in the last 168 hours. No results for input(s): "AMMONIA" in the last 168 hours.  ABG     Component Value Date/Time   PHART 7.40 05/14/2021 0341   PCO2ART 45 05/14/2021 0341   PO2ART 129 (H) 05/14/2021 0341   HCO3 27.9 05/14/2021 0341   ACIDBASEDEF 12.8 (H) 05/11/2021 0603   O2SAT 98.9 05/14/2021 0341     Coagulation Profile: Recent Labs  Lab 12/05/22 2056 12/06/22 0404  INR 1.1 1.2    Cardiac Enzymes: No results for input(s): "CKTOTAL", "CKMB", "CKMBINDEX", "TROPONINI" in the last 168 hours.  HbA1C: Hgb A1c MFr Bld  Date/Time Value Ref Range Status  05/05/2021 04:53 AM 6.7 (H) 4.8 - 5.6 % Final    Comment:    (NOTE)         Prediabetes: 5.7 - 6.4         Diabetes: >6.4         Glycemic control for adults with diabetes: <7.0   02/11/2021 09:41 AM 6.3 (H) <5.7 % of total Hgb Final    Comment:    For someone without known diabetes, a hemoglobin  A1c value between 5.7% and 6.4% is consistent with prediabetes and should be confirmed with a  follow-up test. . For someone with known diabetes, a value <7% indicates that their diabetes is well controlled. A1c targets should be individualized based on duration of diabetes, age, comorbid conditions, and other considerations. . This assay result is consistent with an increased risk of diabetes. . Currently, no consensus exists regarding use of hemoglobin A1c for diagnosis of diabetes for children. .     CBG: Recent Labs  Lab 12/05/22 2107 12/06/22 1916 12/06/22 2336 12/07/22 0402  GLUCAP 178* 107* 134* 162*    Review of Systems:   Unable to assess due to AMS  Past Medical History:  She,  has a past medical history of AKI (acute kidney injury) (HCC), Arthritis, Asthma, Bacteremia, COPD (chronic obstructive pulmonary disease) (HCC), Diabetes mellitus without complication (HCC), Endometriosis, GERD (gastroesophageal reflux disease), History of echocardiogram, Hypertension, Obesity, PAF (paroxysmal atrial fibrillation) (HCC), and Sleep apnea.   Surgical History:   Past  Surgical History:  Procedure  Laterality Date   ABDOMINAL HYSTERECTOMY     APPLICATION OF WOUND VAC Left 05/17/2021   Procedure: APPLICATION OF WOUND VAC/WOUND VAC EXCHANGE-Matrix Myriad;  Surgeon: Carolan Shiver, MD;  Location: [facility];  Service: General;  Laterality: Left;   APPLICATION OF WOUND VAC  05/24/2021   Procedure: APPLICATION OF WOUND VAC;  Surgeon: Carolan Shiver, MD;  Location: [facility];  Service: General;;   APPLICATION OF WOUND VAC  05/10/2021   Procedure: APPLICATION OF WOUND VAC;  Surgeon: Carolan Shiver, MD;  Location: [facility];  Service: General;;   COLONOSCOPY  10/29/2006   Dr Servando Snare   COLONOSCOPY WITH PROPOFOL N/A 11/05/2016   Procedure: COLONOSCOPY WITH PROPOFOL;  Surgeon: Earline Mayotte, MD;  Location: [facility];  Service: Endoscopy;  Laterality: N/A;   INCISION AND DRAINAGE OF WOUND Left 05/24/2021   Procedure: IRRIGATION AND DEBRIDEMENT LEFT LEG;  Surgeon: Carolan Shiver, MD;  Location: [facility];  Service: General;  Laterality: Left;   INCISION AND DRAINAGE OF WOUND Left 05/10/2021   Procedure: IRRIGATION AND DEBRIDEMENT LEFT LEG;  Surgeon: Carolan Shiver, MD;  Location: [facility];  Service: General;  Laterality: Left;   IR FLUORO GUIDE CV LINE RIGHT  06/12/2021   IR PERC TUN PERIT CATH WO PORT S&I /IMAG  06/12/2021   IR REPLC GASTRO/COLONIC TUBE PERCUT W/FLUORO  06/12/2021   IVC FILTER INSERTION N/A 05/29/2021   Procedure: IVC FILTER INSERTION;  Surgeon: Renford Dills, MD;  Location: [facility];  Service: Cardiovascular;  Laterality: N/A;   MINOR GRAFT APPLICATION  05/24/2021   Procedure: Myriad Matrix  APPLICATION;  Surgeon: Carolan Shiver, MD;  Location: [facility];  Service: General;;   NASAL SINUS SURGERY  2002   Dr Chestine Spore   PEG PLACEMENT N/A 05/28/2021   Procedure: PERCUTANEOUS ENDOSCOPIC GASTROSTOMY (PEG) PLACEMENT;  Surgeon: Sung Amabile, DO;  Location: [facility];  Service: General;  Laterality: N/A;  TRAVEL CASE   TRACHEOSTOMY TUBE  PLACEMENT N/A 05/17/2021   Procedure: TRACHEOSTOMY;  Surgeon: Geanie Logan, MD;  Location: [facility];  Service: ENT;  Laterality: N/A;   WOUND DEBRIDEMENT Left 05/07/2021   Procedure: DEBRIDEMENT WOUND;  Surgeon: Carolan Shiver, MD;  Location: [facility];  Service: General;  Laterality: Left;     Social History:   reports that she quit smoking about 21 years ago. Her smoking use included cigarettes. She started smoking about 61 years ago. She has a 80 pack-year smoking history. She quit smokeless tobacco use about 21 years ago.  Her smokeless tobacco use included snuff. She reports that she does not drink alcohol and does not use drugs.   Family History:  Her family history includes Alcohol abuse in her brother; Breast cancer (age of onset: 21) in her sister; Congestive Heart Failure in her mother; Coronary artery disease (age of onset: 2) in her father; Heart disease in her brother; Varicose Veins in her brother.   Allergies No Known Allergies   Home Medications  Prior to Admission medications   Medication Sig Start Date End Date Taking? Authorizing Provider  amiodarone (PACERONE) 200 MG tablet Place 1 tablet (200 mg total) into feeding tube 2 (two) times daily. 06/13/21  Yes Erin Fulling, MD  ascorbic acid (VITAMIN C) 500 MG tablet Place 1 tablet (500 mg total) into feeding tube 2 (two) times daily. 06/13/21  Yes Kasa, Wallis Bamberg, MD  busPIRone (BUSPAR) 5 MG tablet Take 5 mg by mouth 2 (two) times daily. 12/04/22  Yes [provider]  cholecalciferol (VITAMIN D) 25 MCG tablet Place 2 tablets (2,000 Units total) into feeding tube daily. 06/14/21  Yes Kasa, Wallis Bamberg, MD  clonazePAM (KLONOPIN) 0.5 MG tablet Take 0.25-0.5 mg by mouth 2 (two) times daily. 0.25 mg every morning and 0.5 mg at bedtime for Agitation 11/18/22  Yes [provider]  escitalopram (LEXAPRO) 10 MG tablet Place 1 tablet (10 mg total) into feeding tube daily. 06/14/21  Yes Erin Fulling, MD  hydrOXYzine (ATARAX) 50 MG  tablet Take 100 mg by mouth 2 (two) times daily. 12/02/22  Yes [provider]  lip balm (BLISTEX) OINT Apply 1 application topically 3 (three) times daily. 06/13/21  Yes Erin Fulling, MD  loperamide (IMODIUM) 2 MG capsule Take 1 capsule (2 mg total) by mouth as needed for diarrhea or loose stools. 06/13/21  Yes Erin Fulling, MD  midodrine (PROAMATINE) 5 MG tablet Place 3 tablets (15 mg total) into feeding tube 3 (three) times daily with meals. 06/13/21  Yes Erin Fulling, MD  multivitamin (RENA-VIT) TABS tablet Place 1 tablet into feeding tube at bedtime. 06/13/21  Yes Erin Fulling, MD  omeprazole (PRILOSEC) 20 MG capsule Take 20 mg by mouth daily. 11/12/22  Yes [provider]  sennosides (SENOKOT) 8.8 MG/5ML syrup Place 10 mLs into feeding tube at bedtime as needed for moderate constipation. 06/13/21  Yes Erin Fulling, MD  artificial tears (LACRILUBE) OINT ophthalmic ointment Place into both eyes every 4 (four) hours as needed for dry eyes. Patient not taking: Reported on 12/06/2022 06/13/21   Erin Fulling, MD  chlorhexidine gluconate, MEDLINE KIT, (PERIDEX) 0.12 % solution 15 mLs by Mouth Rinse route 2 (two) times daily. Patient not taking: Reported on 12/06/2022 06/13/21   Erin Fulling, MD  collagenase (SANTYL) ointment Apply topically daily. Patient not taking: Reported on 12/06/2022 06/14/21   Erin Fulling, MD  dextrose 5 % SOLN 100 mL with copper chloride 0.4 MG/ML SOLN 2 mg Inject 2 mg into the vein daily. 06/14/21   Erin Fulling, MD  ipratropium-albuterol (DUONEB) 0.5-2.5 (3) MG/3ML SOLN Take 3 mLs by nebulization 3 (three) times daily. Patient not taking: Reported on 12/06/2022 06/13/21   Erin Fulling, MD  lactobacillus (FLORANEX/LACTINEX) PACK Place 1 packet (1 g total) into feeding tube 3 (three) times daily with meals. Patient not taking: Reported on 12/06/2022 06/13/21   Erin Fulling, MD  pantoprazole sodium (PROTONIX) 40 mg Place 40 mg into feeding tube daily. Patient not taking: Reported on  12/06/2022 06/13/21   Erin Fulling, MD  Scheduled Meds:  amiodarone  200 mg Per Tube BID   ascorbic acid  500 mg Per Tube BID   chlorhexidine  15 mL Mouth Rinse BID   Chlorhexidine Gluconate Cloth  6 each Topical Q0600   Chlorhexidine Gluconate Cloth  6 each Topical Q0600   vitamin D3  2,000 Units Per Tube Daily   escitalopram  10 mg Per Tube Daily   famotidine  40 mg Per Tube Daily   heparin injection (subcutaneous)  5,000 Units Subcutaneous Q8H   lactobacillus  1 g Per Tube TID WC   midodrine  15 mg Per Tube TID WC   multivitamin  1 tablet Per Tube QHS   mupirocin ointment   Nasal BID   Continuous Infusions:   prismasol BGK 4/2.5 400 mL/hr at 12/07/22 0309    prismasol BGK 4/2.5 400 mL/hr at 12/07/22 0307   sodium chloride Stopped (12/07/22 0210)   ceFEPime (MAXIPIME) IV 2 g (12/07/22 0209)   heparin 10,000  units/ 20 mL infusion syringe 500 Units/hr (12/07/22 0603)   norepinephrine (LEVOPHED) Adult infusion 11 mcg/min (12/07/22 0630)   prismasol BGK 4/2.5 2,000 mL/hr at 12/07/22 0622   vancomycin     PRN Meds:.sodium chloride, acetaminophen **OR** acetaminophen, artificial tears, heparin, heparin, ipratropium-albuterol, loperamide HCl, magnesium hydroxide, ondansetron **OR** ondansetron (ZOFRAN) IV, mouth rinse, sennosides, sodium chloride, traZODone   Critical care time: 55 minutes    Webb Silversmith, DNP, CCRN, FNP-C, AGACNP-BC Acute Care & Family Nurse Practitioner  [facility]  See Amion for personal pager PCCM on call pager [phone] until 7 am

## 2022-12-07 NOTE — Progress Notes (Addendum)
Progress Note    Alexandra Foster  PPI:[id number] DOB: Nov 18, 1950  DOA: 12/05/2022 PCP: Patrecia Pour, MD      Brief Narrative:    Medical records reviewed and are as summarized below:  Alexandra Foster is a 72 y.o. 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  There was a concern about aspiration pneumonia at his facility as the patient had an episode of nausea and vomiting and was reportedly coughing per her sister. Her temperature was 102.2 F when EMS arrived at the facility.  Code stroke was activated in the ED because of concern for stroke.  She was evaluated by teleneurologist who felt that clinical presentation was not consistent with acute stroke.  She was admitted to the hospital for severe sepsis.  Initially, this was thought to be due to acute UTI.  Workup revealed MRSA bacteremia (4 out of 4 blood culture bottles were positive).  She developed hypotension which was thought to be due to septic shock and was transferred to the ICU for further management.     Assessment/Plan:   Principal Problem:   Septic shock (HCC) Active Problems:   MRSA bacteremia   Acute metabolic encephalopathy   Paroxysmal atrial fibrillation (HCC)   Type 2 diabetes mellitus with chronic kidney disease, with long-term current use of insulin (HCC)   ESRD on hemodialysis (HCC)   Anxiety and depression    Body mass index is 32.96 kg/m.   (Obesity)   Septic shock secondary to MRSA bacteremia: 4 out of 4 blood culture bottles showed MRSA.  Staph epidermidis was also detected on blood culture.  Continue IV vancomycin and cefepime.  Continue IV Levophed drip and wean off as able.  2D echo is pending.  She may need TEE if 2D echo is unremarkable. S/p removal of right IJ hemodialysis catheter on 12/06/2022. S/p placement of left femoral nontunneled central line on 12/06/2022. Follow-up with intensivist and ID specialist.   Acute metabolic encephalopathy: She is still confused.  Continue supportive care.   ESRD on hemodialysis: Hemodialysis was prematurely terminated on the morning of 12/06/2022.  He had CCRT and evening of 12/06/2022.  Follow-up with nephrologist   Left-sided weakness and left facial droop, possible TIA: CT head and MRI brain did not show any acute abnormality or stroke.  Follow-up carotid ultrasound   Hypokalemia: Replete potassium and monitor levels   COPD: Bronchodilators as needed. OSA: Uses CPAP nightly.   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  Follow-up as an outpatient    Diet Order             Diet Heart Room service appropriate? Yes; Fluid consistency: Thin  Diet effective now  Consultants: Nephrologist Intensivist Tele neurologist  Procedures: Left femoral line central line placement on 12/06/2022 Arterial line placement on 12/06/2022    Medications:    amiodarone  200 mg Per Tube BID   ascorbic acid  500 mg Per Tube BID   [START ON 12/08/2022] atorvastatin  40 mg  Per Tube Daily   chlorhexidine  15 mL Mouth Rinse BID   Chlorhexidine Gluconate Cloth  6 each Topical Q0600   Chlorhexidine Gluconate Cloth  6 each Topical Q0600   vitamin D3  2,000 Units Per Tube Daily   droxidopa  300 mg Oral TID WC   escitalopram  10 mg Per Tube Daily   famotidine  40 mg Per Tube Daily   heparin injection (subcutaneous)  5,000 Units Subcutaneous Q8H   hydrocortisone sod succinate (SOLU-CORTEF) inj  100 mg Intravenous Q6H   lactobacillus  1 g Per Tube TID WC   midodrine  15 mg Per Tube TID WC   multivitamin  1 tablet Per Tube QHS   mupirocin ointment   Nasal BID   Continuous Infusions:   prismasol BGK 4/2.5 400 mL/hr at 12/07/22 1119    prismasol BGK 4/2.5 400 mL/hr at 12/07/22 1119   sodium chloride Stopped (12/07/22 0210)   ceFEPime (MAXIPIME) IV 2 g (12/07/22 0209)   heparin 10,000 units/ 20 mL infusion syringe 1,000 Units/hr (12/07/22 1118)   norepinephrine (LEVOPHED) Adult infusion 15 mcg/min (12/07/22 1200)   prismasol BGK 4/2.5 2,000 mL/hr at 12/07/22 1120   vancomycin       Anti-infectives (From admission, onward)    Start     Dose/Rate Route Frequency Ordered Stop   12/07/22 1400  vancomycin (VANCOCIN) IVPB 1000 mg/200 mL premix        1,000 mg 200 mL/hr over 60 Minutes Intravenous Every 24 hours 12/06/22 1234     12/06/22 2200  cefTRIAXone (ROCEPHIN) 2 g in sodium chloride 0.9 % 100 mL IVPB  Status:  Discontinued        2 g 200 mL/hr over 30 Minutes Intravenous Every 24 hours 12/06/22 0132 12/06/22 1223   12/06/22 1500  ceFEPIme (MAXIPIME) 2 g in sodium chloride 0.9 % 100 mL IVPB        2 g 200 mL/hr over 30 Minutes Intravenous Every 12 hours 12/06/22 1252     12/06/22 1315  vancomycin (VANCOREADY) IVPB 1750 mg/350 mL        1,750 mg 175 mL/hr over 120 Minutes Intravenous  Once 12/06/22 1224 12/06/22 1724   12/05/22 2130  cefTRIAXone (ROCEPHIN) 2 g in sodium chloride 0.9 % 100 mL IVPB  Status:  Discontinued        2 g 200 mL/hr over 30 Minutes  Intravenous Every 24 hours 12/05/22 2115 12/06/22 0202   12/05/22 2130  azithromycin (ZITHROMAX) 500 mg in sodium chloride 0.9 % 250 mL IVPB  Status:  Discontinued        500 mg 250 mL/hr over 60 Minutes Intravenous Every 24 hours 12/05/22 2115 12/06/22 1223              Family Communication/Anticipated D/C date and plan/Code Status   DVT prophylaxis: heparin 10,000 units/ 20 mL infusion syringe Start: 12/06/22 1945 heparin bolus via infusion syringe 1,000 Units Start: 12/06/22 1838 heparin injection 5,000 Units Start: 12/06/22 0600     Code Status: Full Code  Family Communication: None Disposition Plan: Plan to discharge to SNF   Status is: Inpatient Remains inpatient appropriate because: Septic shock  Subjective:   Interval events noted.  She is confused and cannot provide any history.  Patient was noted to have left-sided facial droop overnight.  Objective:    Vitals:   12/07/22 1045 12/07/22 1100 12/07/22 1115 12/07/22 1130  BP: (!) 112/93 (!) 103/26 109/69 96/64  Pulse: 92 89 92 90  Resp: (!) 22 (!) 25 (!) 22 (!) 22  Temp:      TempSrc:      SpO2: 100% 100% 100% 100%  Weight:      Height:       No data found.   Intake/Output Summary (Last 24 hours) at 12/07/2022 1208 Last data filed at 12/07/2022 1200 Gross per 24 hour  Intake 1471.66 ml  Output 661.7 ml  Net 809.96 ml   Filed Weights   12/06/22 1054 12/06/22 1215 12/07/22 0500  Weight: 86 kg 84.4 kg 87.1 kg    Exam:  GEN: NAD SKIN: Warm and dry EYES: No pallor or icterus ENT: MMM CV: RRR PULM: CTA B ABD: soft, obese, NT, +BS CNS: Drowsy but arousable, slurred speech, she is able to move all extremities spontaneously EXT: Left AKA.  Cold extremities    Pressure Injury 12/06/22 Buttocks Left Stage 2 -  Partial thickness loss of dermis presenting as a shallow open injury with a red, pink wound bed without slough. (Active)  12/06/22 1800  Location: Buttocks  Location  Orientation: Left  Staging: Stage 2 -  Partial thickness loss of dermis presenting as a shallow open injury with a red, pink wound bed without slough.  Wound Description (Comments):   Present on Admission: Yes  Dressing Type Foam - Lift dressing to assess site every shift 12/07/22 0530     Pressure Injury 12/06/22 Buttocks Right Stage 2 -  Partial thickness loss of dermis presenting as a shallow open injury with a red, pink wound bed without slough. (Active)  12/06/22 1800  Location: Buttocks  Location Orientation: Right  Staging: Stage 2 -  Partial thickness loss of dermis presenting as a shallow open injury with a red, pink wound bed without slough.  Wound Description (Comments):   Present on Admission: Yes  Dressing Type Foam - Lift dressing to assess site every shift 12/07/22 0530     Data Reviewed:   I have personally reviewed following labs and imaging studies:  Labs: Labs show the following:   Basic Metabolic Panel: Recent Labs  Lab 12/05/22 2056 12/06/22 0354 12/06/22 0404 12/06/22 1614 12/07/22 0213  NA 128* 133* 134* 132* 132*  K 4.2 4.1 4.1 3.5 3.2*  CL 90* 99 100 100 103  CO2 22 19* 23 20* 20*  GLUCOSE 197* 103* 108* 129* 149*  BUN 67* 66* 62* 54* 32*  CREATININE 4.53* 4.34* 4.23* 3.26* 1.97*  CALCIUM 9.6 8.7* 8.4* 8.0* 8.7*  MG  --   --   --   --  1.9  PHOS  --  2.8  --  2.9 2.5   GFR Estimated Creatinine Clearance: 26.8 mL/min (A) (by C-G formula based on SCr of 1.97 mg/dL (H)). Liver Function Tests: Recent Labs  Lab 12/05/22 2056 12/06/22 0354 12/06/22 1614 12/07/22 0213  AST 66*  --   --   --   ALT 87*  --   --   --   ALKPHOS 157*  --   --   --   BILITOT 0.6  --   --   --   PROT 8.6*  --   --   --  ALBUMIN 3.3* 2.5* 2.3* 1.9*   No results for input(s): "LIPASE", "AMYLASE" in the last 168 hours. No results for input(s): "AMMONIA" in the last 168 hours. Coagulation profile Recent Labs  Lab 12/05/22 2056 12/06/22 0404  INR 1.1 1.2     CBC: Recent Labs  Lab 12/05/22 2056 12/06/22 0404 12/06/22 0734 12/06/22 1934 12/07/22 0213  WBC 21.4* 16.0* 16.1*  --  22.5*  NEUTROABS 19.4*  --   --   --   --   HGB 10.2* 8.6* 8.6* 8.2* 8.8*  HCT 33.4* 28.4* 28.1* 26.6* 28.3*  MCV 84.3 85.5 85.4  --  83.0  PLT 411* 352 377  --  293   Cardiac Enzymes: No results for input(s): "CKTOTAL", "CKMB", "CKMBINDEX", "TROPONINI" in the last 168 hours. BNP (last 3 results) No results for input(s): "PROBNP" in the last 8760 hours. CBG: Recent Labs  Lab 12/06/22 1916 12/06/22 2336 12/07/22 0402 12/07/22 0725 12/07/22 1143  GLUCAP 107* 134* 162* 151* 152*   D-Dimer: No results for input(s): "DDIMER" in the last 72 hours. Hgb A1c: No results for input(s): "HGBA1C" in the last 72 hours. Lipid Profile: No results for input(s): "CHOL", "HDL", "LDLCALC", "TRIG", "CHOLHDL", "LDLDIRECT" in the last 72 hours. Thyroid function studies: No results for input(s): "TSH", "T4TOTAL", "T3FREE", "THYROIDAB" in the last 72 hours.  Invalid input(s): "FREET3" Anemia work up: No results for input(s): "VITAMINB12", "FOLATE", "FERRITIN", "TIBC", "IRON", "RETICCTPCT" in the last 72 hours. Sepsis Labs: Recent Labs  Lab 12/05/22 2056 12/06/22 0016 12/06/22 0404 12/06/22 0734 12/06/22 0735 12/06/22 1257 12/07/22 0213  PROCALCITON  --   --  2.01  --   --   --  1.51  WBC 21.4*  --  16.0* 16.1*  --   --  22.5*  LATICACIDVEN 2.3* 2.9* 2.0*  --  1.9 1.7  --     Microbiology Recent Results (from the past 240 hour(s))  Culture, blood (Routine x 2)     Status: Abnormal (Preliminary result)   Collection Time: 12/05/22  8:56 PM   Specimen: BLOOD  Result Value Ref Range Status   Specimen Description   Final    BLOOD BLOOD RIGHT ARM Performed at [facility], [address], [place], [place] [address]    Special Requests   Final    BOTTLES DRAWN AEROBIC AND ANAEROBIC Blood Culture adequate volume Performed at [facility],  [address] [place], [place] [address]    Culture  Setup Time   Final    GRAM POSITIVE COCCI IN BOTH AEROBIC AND ANAEROBIC BOTTLES CRITICAL RESULT CALLED TO, READ BACK BY AND VERIFIED WITH: SHEEMA HALLLAJI AT 1206 12/06/22.PMF Performed at [facility] [address], [place], [place] [address]    Culture STAPHYLOCOCCUS AUREUS (A)  Final   Report Status PENDING  Incomplete  Culture, blood (Routine x 2)     Status: Abnormal (Preliminary result)   Collection Time: 12/05/22  8:57 PM   Specimen: BLOOD  Result Value Ref Range Status   Specimen Description   Final    BLOOD BLOOD LEFT ARM Performed at [facility], [address], [place], [place] [address]    Special Requests   Final    BOTTLES DRAWN AEROBIC AND ANAEROBIC Blood Culture adequate volume Performed at [facility], [address] [place], [place] [address]    Culture  Setup Time   Final    GRAM POSITIVE COCCI IN BOTH AEROBIC AND ANAEROBIC BOTTLES CRITICAL RESULT CALLED TO,  READ BACK BY AND VERIFIED WITH: SHEEMA HALLAJI AT 1206 12/06/22.PMF    Culture (A)  Final    STAPHYLOCOCCUS AUREUS SUSCEPTIBILITIES TO FOLLOW CULTURE REINCUBATED FOR BETTER GROWTH Performed at [facility], [address], [place], [place] [address]    Report Status PENDING  Incomplete  Blood Culture ID Panel (Reflexed)     Status: Abnormal   Collection Time: 12/05/22  8:57 PM  Result Value Ref Range Status   Enterococcus faecalis NOT DETECTED NOT DETECTED Final   Enterococcus Faecium NOT DETECTED NOT DETECTED Final   Listeria monocytogenes NOT DETECTED NOT DETECTED Final   Staphylococcus species DETECTED (A) NOT DETECTED Final    Comment: CRITICAL RESULT CALLED TO, READ BACK BY AND VERIFIED WITH: SHEEMA HALLAJI AT 1206 12/06/22.PMF    Staphylococcus aureus (BCID) DETECTED (A) NOT DETECTED Final    Comment: Methicillin (oxacillin)-resistant Staphylococcus aureus (MRSA). MRSA is predictably resistant to  beta-lactam antibiotics (except ceftaroline). Preferred therapy is vancomycin unless clinically contraindicated. Patient requires contact precautions if  hospitalized. CRITICAL RESULT CALLED TO, READ BACK BY AND VERIFIED WITH: SHEEMA HALLAJI AT 1206 12/06/22.PMF    Staphylococcus epidermidis DETECTED (A) NOT DETECTED Final    Comment: CRITICAL RESULT CALLED TO, READ BACK BY AND VERIFIED WITH: SHEEMA HALLAJI AT 1206 12/06/22.PMF    Staphylococcus lugdunensis NOT DETECTED NOT DETECTED Final   Streptococcus species NOT DETECTED NOT DETECTED Final   Streptococcus agalactiae NOT DETECTED NOT DETECTED Final   Streptococcus pneumoniae NOT DETECTED NOT DETECTED Final   Streptococcus pyogenes NOT DETECTED NOT DETECTED Final   A.calcoaceticus-baumannii NOT DETECTED NOT DETECTED Final   Bacteroides fragilis NOT DETECTED NOT DETECTED Final   Enterobacterales NOT DETECTED NOT DETECTED Final   Enterobacter cloacae complex NOT DETECTED NOT DETECTED Final   Escherichia coli NOT DETECTED NOT DETECTED Final   Klebsiella aerogenes NOT DETECTED NOT DETECTED Final   Klebsiella oxytoca NOT DETECTED NOT DETECTED Final   Klebsiella pneumoniae NOT DETECTED NOT DETECTED Final   Proteus species NOT DETECTED NOT DETECTED Final   Salmonella species NOT DETECTED NOT DETECTED Final   Serratia marcescens NOT DETECTED NOT DETECTED Final   Haemophilus influenzae NOT DETECTED NOT DETECTED Final   Neisseria meningitidis NOT DETECTED NOT DETECTED Final   Pseudomonas aeruginosa NOT DETECTED NOT DETECTED Final   Stenotrophomonas maltophilia NOT DETECTED NOT DETECTED Final   Candida albicans NOT DETECTED NOT DETECTED Final   Candida auris NOT DETECTED NOT DETECTED Final   Candida glabrata NOT DETECTED NOT DETECTED Final   Candida krusei NOT DETECTED NOT DETECTED Final   Candida parapsilosis NOT DETECTED NOT DETECTED Final   Candida tropicalis NOT DETECTED NOT DETECTED Final   Cryptococcus neoformans/gattii NOT DETECTED NOT  DETECTED Final   Methicillin resistance mecA/C DETECTED (A) NOT DETECTED Final    Comment: CRITICAL RESULT CALLED TO, READ BACK BY AND VERIFIED WITH: SHEEMA HALLAJI AT 1206 12/06/22.PMF    Meth resistant mecA/C and MREJ DETECTED (A) NOT DETECTED Final    Comment: CRITICAL RESULT CALLED TO, READ BACK BY AND VERIFIED WITH: SHEEMA HALLAJI AT 1206 12/06/22.PMF Performed at [facility], [address] [place], [place] [address]   Resp panel by RT-PCR (RSV, Flu A&B, Covid) Anterior Nasal Swab     Status: None   Collection Time: 12/05/22  9:53 PM   Specimen: Anterior Nasal Swab  Result Value Ref Range Status   SARS Coronavirus 2 by RT PCR NEGATIVE NEGATIVE Final    Comment: (NOTE) SARS-CoV-2 target nucleic acids are NOT DETECTED.  The SARS-CoV-2 RNA is generally detectable in upper respiratory specimens during the acute phase of infection. The lowest concentration of SARS-CoV-2 viral copies this assay can detect is 138 copies/mL. A negative result does not preclude SARS-Cov-2 infection and should not be used as the sole basis for treatment or other patient management decisions. A negative result may occur with  improper specimen collection/handling, submission of specimen other than nasopharyngeal swab, presence of viral mutation(s) within the areas targeted by this assay, and inadequate number of viral copies(<138 copies/mL). A negative result must be combined with clinical observations, patient history, and epidemiological information. The expected result is Negative.  Fact Sheet for Patients:  BloggerCourse.com  Fact Sheet for Healthcare Providers:  SeriousBroker.it  This test is no t yet approved or cleared by the [place] FDA and  has been authorized for detection and/or diagnosis of SARS-CoV-2 by FDA under an Emergency Use Authorization (EUA). This EUA will remain  in effect (meaning this test can be used) for the  duration of the COVID-19 declaration under Section 564(b)(1) of the Act, 21 U.S.C.section 360bbb-3(b)(1), unless the authorization is terminated  or revoked sooner.       Influenza A by PCR NEGATIVE NEGATIVE Final   Influenza B by PCR NEGATIVE NEGATIVE Final    Comment: (NOTE) The Xpert Xpress SARS-CoV-2/FLU/RSV plus assay is intended as an aid in the diagnosis of influenza from Nasopharyngeal swab specimens and should not be used as a sole basis for treatment. Nasal washings and aspirates are unacceptable for Xpert Xpress SARS-CoV-2/FLU/RSV testing.  Fact Sheet for Patients: BloggerCourse.com  Fact Sheet for Healthcare Providers: SeriousBroker.it  This test is not yet approved or cleared by the [place] FDA and has been authorized for detection and/or diagnosis of SARS-CoV-2 by FDA under an Emergency Use Authorization (EUA). This EUA will remain in effect (meaning this test can be used) for the duration of the COVID-19 declaration under Section 564(b)(1) of the Act, 21 U.S.C. section 360bbb-3(b)(1), unless the authorization is terminated or revoked.     Resp Syncytial Virus by PCR NEGATIVE NEGATIVE Final    Comment: (NOTE) Fact Sheet for Patients: BloggerCourse.com  Fact Sheet for Healthcare Providers: SeriousBroker.it  This test is not yet approved or cleared by the [place] FDA and has been authorized for detection and/or diagnosis of SARS-CoV-2 by FDA under an Emergency Use Authorization (EUA). This EUA will remain in effect (meaning this test can be used) for the duration of the COVID-19 declaration under Section 564(b)(1) of the Act, 21 U.S.C. section 360bbb-3(b)(1), unless the authorization is terminated or revoked.  Performed at [facility], [address] [place], [place] [address]   MRSA Next Gen by PCR, Nasal     Status: Abnormal    Collection Time: 12/06/22  4:27 AM   Specimen: Nasal Mucosa; Nasal Swab  Result Value Ref Range Status   MRSA by PCR Next Gen DETECTED (A) NOT DETECTED Final    Comment: RESULT CALLED TO, READ BACK BY AND VERIFIED WITH: KRISTINE CHAMBERS AT 9518 12/06/22.PMF (NOTE) The GeneXpert MRSA Assay (FDA approved for NASAL specimens only), is one component of a comprehensive MRSA colonization surveillance program. It is not intended to diagnose MRSA infection nor to guide or monitor treatment for MRSA infections. Test performance is not FDA approved in patients less than 26 years old. Performed at [facility], [address], [place], [place] [address]     Procedures and diagnostic studies:  MR BRAIN WO CONTRAST  Result  Date: 12/07/2022 CLINICAL DATA:  Provided history: Neuro deficit, acute, stroke suspected. Additional history provided: Altered mental status, lethargy, decreased responsiveness. EXAM: MRI HEAD WITHOUT CONTRAST TECHNIQUE: Multiplanar, multiecho pulse sequences of the brain and surrounding structures were obtained without intravenous contrast. COMPARISON:  Non-contrast head CT 12/05/2022. FINDINGS: Brain: Mild-to-moderate generalized cerebral atrophy. Multifocal T2 FLAIR hyperintense signal abnormality within the cerebral white matter, nonspecific but compatible with minimal chronic small vessel ischemic disease. Curvilinear susceptibility weighted-signal loss within a sulcus along the mid-to-posterior left frontal lobe. This may reflect a vessel or a small amount of chronic subarachnoid hemorrhage. Expanded and partially empty sella turcica. There is no acute infarct. No evidence of an intracranial mass. No extra-axial fluid collection. No midline shift. Vascular: Maintained flow voids within the proximal large arterial vessels. Possible left frontal lobe developmental venous anomaly (anatomic variant). Skull and upper cervical spine: No focal suspicious marrow lesion.  Sinuses/Orbits: No mass or acute finding within the imaged orbits. Postsurgical appearance of the paranasal sinuses. Frothy secretions, and moderate background mucosal thickening, within the right sphenoid sinus. Moderate mucosal thickening within the left sphenoid sinus. Scattered opacification of bilateral ethmoid air cells, overall mild-to-moderate in severity. Mild mucosal thickening within the bilateral frontal sinuses. Other: Trace fluid within the bilateral mastoid air cells. Other: 7 mm Tornwaldt cyst within the midline posterior nasopharynx. IMPRESSION: 1. No evidence of an acute intracranial abnormality. 2. Minimal chronic small vessel ischemic changes within the cerebral white matter. 3. Possible small amount of chronic subarachnoid hemorrhage along the mid-to-posterior left frontal lobe, as described. 4. Mild-to-moderate generalized cerebral atrophy. 5. Paranasal sinus disease as described. Electronically Signed   By: Jackey Loge D.O.   On: 12/07/2022 10:18   CT HEAD CODE STROKE WO CONTRAST  Result Date: 12/05/2022 CLINICAL DATA:  Code stroke.  Right facial droop and slurred speech EXAM: CT HEAD WITHOUT CONTRAST TECHNIQUE: Contiguous axial images were obtained from the base of the skull through the vertex without intravenous contrast. RADIATION DOSE REDUCTION: This exam was performed according to the departmental dose-optimization program which includes automated exposure control, adjustment of the mA and/or kV according to patient size and/or use of iterative reconstruction technique. COMPARISON:  None Available. FINDINGS: Brain: There is no mass, hemorrhage or extra-axial collection. The size and configuration of the ventricles and extra-axial CSF spaces are normal. The brain parenchyma is normal, without evidence of acute or chronic infarction. Vascular: No abnormal hyperdensity of the major intracranial arteries or dural venous sinuses. No intracranial atherosclerosis. Skull: The visualized  skull base, calvarium and extracranial soft tissues are normal. Sinuses/Orbits: No fluid levels or advanced mucosal thickening of the visualized paranasal sinuses. No mastoid or middle ear effusion. The orbits are normal. ASPECTS [facility] Stroke Program Early CT Score) - Ganglionic level infarction (caudate, lentiform nuclei, internal capsule, insula, M1-M3 cortex): 7 - Supraganglionic infarction (M4-M6 cortex): 3 Total score (0-10 with 10 being normal): 10 IMPRESSION: 1. No acute intracranial abnormality. 2. ASPECTS is 10. These results were called by telephone at the time of interpretation on 12/05/2022 at 9:39 pm to provider [facility] , who verbally acknowledged these results. Electronically Signed   By: Deatra Robinson M.D.   On: 12/05/2022 21:39   DG Chest Port 1 View  Result Date: 12/05/2022 CLINICAL DATA:  Chest pain EXAM: PORTABLE CHEST 1 VIEW COMPARISON:  06/10/2021 FINDINGS: Cardiac shadow is mildly enlarged. Dialysis catheter is noted on the right new from the prior exam. Lungs are clear bilaterally. Mild central vascular prominence is noted likely  related to volume overload. No effusion is seen. No bony abnormality is noted. IMPRESSION: Mild vascular congestion likely related to volume overload. Electronically Signed   By: Alcide Clever M.D.   On: 12/05/2022 21:21               LOS: 1 day   Mortimer Bair  [facility]   Pager on [email or web address]. If 7PM-7AM, please contact night-coverage at [email or web address]     12/07/2022, 12:08 PM

## 2022-12-07 NOTE — Consult Note (Deleted)
NAME:  Alexandra Foster, MRN:  [id number], DOB:  1951/03/05, LOS: 1 ADMISSION DATE:  12/05/2022, CONSULTATION DATE:  12/06/2022 REFERRING MD:  Dr. Wynelle Link, CHIEF COMPLAINT:  Hypotension   Brief Pt Description / Synopsis:  72 y.o female with PMHx significant for ESRD on HD admitted with Acute Metabolic Encephalopathy in setting of Severe Sepsis with Septic Shock due to MRSA & Staph Epi Bacteremia (suspect from Right chest Permcath) along with UTI.  History of Present Illness:  Alexandra Foster is a 72 y.o. female with past medical history significant for asthma, COPD, type diabetes mellitus, s/p G-tube, GERD, ESRD on HD, hypertension, osteoarthritis, paroxysmal atrial fibrillation and OSA on CPAP, who presented to [facility] ED on 12/05/22 from [facility] due to altered mental status with lethargy and decreased responsiveness.  There was a concern about aspiration pneumonia at his facility as the patient had an episode of nausea and vomiting and was reportedly coughing per her sister without wheezing or dyspnea or hypoxia.  The patient's temperature was 102.2 when EMS arrived to this facility.  The patient was not responding to questions throughout transport.  EMS noted the patient having slurred speech and significant weakness per family (family had not seen her since last week).  Code stroke was called upon arrival to the ED. teleneurology evaluated the patient, was not considered a candidate for thrombolytics given unclear last known well time, along with low suspicion for stroke given significantly global symptoms and multiple metabolic derangements and sepsis contributing to encephalopathy.  ED Course: Initial Vital Signs: Temperature 101.4 F orally, pulse 78, respiratory rate 19, blood pressure 125/70, SpO2 99% on room air Significant Labs: hyponatremia 128 and hypochloremia of 90 with blood glucose of 197, BUN of 67 and creatinine 4.53 with CO2 of 22 and anion gap 16. Alk phos was 157 and  albumin 3.3, AST 66 and ALT 87 with total obtained of 8.6. Lactic acid was 2.3 and later 2.9. CBC showed leukocytosis of 21.4 with neutrophilia and anemia. Respiratory panel including influenza A, B, RSV and COVID-19 PCR came back negative. UA was positive for UTI alcohol level was less than 10. Urine drug screen was positive for tricyclic's.  Imaging Chest X-ray>>IMPRESSION: Mild vascular congestion likely related to volume overload. CT Head w/o contrast>>IMPRESSION: 1. No acute intracranial abnormality. 2. ASPECTS is 10. Medications Administered: IV Rocephin and Zithromax as well as 3 L bolus of IV normal saline   Hospitalist were asked to admit for further workup and treatment.  Nephrology consulted for hemodialysis.  Please see "significant hospital events" section below for full detailed hospital course.  Pertinent  Medical History   Past Medical History:  Diagnosis Date   AKI (acute kidney injury) (HCC)    a. 04/2021 in setting of bacteremia/shock.   Arthritis    Asthma    Bacteremia    a. 04/2021 S pyogenes bacteremia in setting of lower ext cellulitis.   COPD (chronic obstructive pulmonary disease) (HCC)    Diabetes mellitus without complication (HCC)    Endometriosis    GERD (gastroesophageal reflux disease)    History of echocardiogram    a. 07/2013 Echo: EF 55-60%, impaired relaxation, mild TR; b. 04/2021 Echo: EF 50-55%, mild LVH, nl RV fxn, mild BAE, Ao sclerosis w/o stenosis.   Hypertension    Obesity    PAF (paroxysmal atrial fibrillation) (HCC)    a. 04/2021 in setting of septic shock/cellulitis.   Sleep apnea    CPAP    Micro Data:  8/23: SARS-CoV-2/RSV/Flu PCR>>negative 8/23: Blood cultures>> 4/4 bottles with MRSA & Staph epidermidis 8/23: MRSA PCR>> positive 8/23: Urine>> 8/24: Permcath tip>>  Antimicrobials:   Anti-infectives (From admission, onward)    Start     Dose/Rate Route Frequency Ordered Stop   12/07/22 1400  vancomycin (VANCOCIN) IVPB 1000  mg/200 mL premix        1,000 mg 200 mL/hr over 60 Minutes Intravenous Every 24 hours 12/06/22 1234     12/06/22 2200  cefTRIAXone (ROCEPHIN) 2 g in sodium chloride 0.9 % 100 mL IVPB  Status:  Discontinued        2 g 200 mL/hr over 30 Minutes Intravenous Every 24 hours 12/06/22 0132 12/06/22 1223   12/06/22 1500  ceFEPIme (MAXIPIME) 2 g in sodium chloride 0.9 % 100 mL IVPB        2 g 200 mL/hr over 30 Minutes Intravenous Every 12 hours 12/06/22 1252     12/06/22 1315  vancomycin (VANCOREADY) IVPB 1750 mg/350 mL        1,750 mg 175 mL/hr over 120 Minutes Intravenous  Once 12/06/22 1224 12/06/22 1724   12/05/22 2130  cefTRIAXone (ROCEPHIN) 2 g in sodium chloride 0.9 % 100 mL IVPB  Status:  Discontinued        2 g 200 mL/hr over 30 Minutes Intravenous Every 24 hours 12/05/22 2115 12/06/22 0202   12/05/22 2130  azithromycin (ZITHROMAX) 500 mg in sodium chloride 0.9 % 250 mL IVPB  Status:  Discontinued        500 mg 250 mL/hr over 60 Minutes Intravenous Every 24 hours 12/05/22 2115 12/06/22 1223        Significant Hospital Events: Including procedures, antibiotic start and stop dates in addition to other pertinent events   8/23: Presented from [facility] due to AMS, Code Stroke called due to slurred speech and weakness. Deemed not a candidate for thrombolytics due to no clear last known normal time.  Admitted by [facility] with Nephrology consultation for dialysis. 8/24: Rapid response called due to Hypotension prior to/during Hemodialysis.  Transfer to ICU for peripheral vasopressors and initiation of CRRT.  PCCM consulted to assist with vasopressors.  Blood cultures + for MRSA & Staph Epi. ID consulted. Vascular Surgery consulted, permcath removed.  Temporary HD catheter placed. 8/24: this morning with pronounce left facial droop and dysarthria, remains on CRRT on Levo @8  keep MAP>55  Interim History / Subjective:  -See significant events  Objective   Blood pressure 101/61, pulse 90,  temperature 98.6 F (37 C), temperature source Oral, resp. rate (!) 26, height 5\' 4"  (1.626 m), weight 87.1 kg, SpO2 100%.        Intake/Output Summary (Last 24 hours) at 12/07/2022 1027 Last data filed at 12/07/2022 0600 Gross per 24 hour  Intake 1822.23 ml  Output 661.7 ml  Net 1160.53 ml   Filed Weights   12/06/22 1054 12/06/22 1215 12/07/22 0500  Weight: 86 kg 84.4 kg 87.1 kg    Examination: General: Acute on chronically ill-appearing obese female, laying in bed, on room air, no acute distress HENT: Atraumatic, normocephalic, neck supple, difficult to assess JVD due to body habitus Lungs: Diminished breath sounds throughout, even, nonlabored, normal effort Cardiovascular: Regular rate and rhythm, S1-S2, no murmurs, rubs, gallops Abdomen: Obese, soft, nontender, nondistended, no guarding or rebound tenderness, bowel sounds positive x 4 Extremities: Prior left AKA, 1+ edema to right lower extremity, no cyanosis Neuro: Lethargic, arouses easily to voice, oriented to person and place, moves extremities  to command (left side slightly weaker, mild to moderate dysarthria, left facial droop GU: Incontinent of urine  Resolved Hospital Problem list     Assessment & Plan:   #Septic Shock PMHx: Paroxsymal Atrial Fibrillation, HTN Echocardiogram 05/16/21: LVEF 60-65%, mild LVH, Grade I DD, normal RV systolic function, mildly elevated pulmonary artery systolic pressure -Continuous cardiac monitoring -Maintain MAP >65 -Cautious IV fluids due to ESRD -Vasopressors as needed to maintain MAP goal -Continue Midodrine -Trend lactic acid until normalized -Trend HS Troponin until peaked -Cortisol is 35.9 -Echocardiogram pending -Continue outpatient Amiodarone  #Severe Sepsis due to MRSA & Staph Epi Bacteremia (suspect due to Permcath) & UTI -Monitor fever curve -Trend WBC's & Procalcitonin -Follow cultures as above -continue empiric Cefepime & Vancomycin pending cultures &  sensitivities -Obtain Echocardiogram -Right chest Permcath removed  -Consult ID, appreciate input  #ESRD on Hemodialysis #Mild Hyponatremia -Monitor I&O's / urinary output -Follow BMP -Ensure adequate renal perfusion -Avoid nephrotoxic agents as able -Replace electrolytes as indicated ~ Pharmacy following for assistance with electrolyte replacement -Nephrology following, appreciate input: HD vs CRRT per recommendations  #COPD without acute exacerbation #OSA, uses CPAP at home -Supplemental O2 as needed to maintain O2 sats 88 to 92% -CPAP qhs -Follow intermittent Chest X-ray & ABG as needed -Bronchodilators prn -Pulmonary toilet as able  #Anemia of chronic disease -Monitor for S/Sx of bleeding -Trend CBC -Heparin SQ for VTE Prophylaxis  -Transfuse for Hgb <7  #Type 2 diabetes mellitus -CBG's q4h; Target range of 140 to 180 -SSI -Follow ICU Hypo/Hyperglycemia protocol -Hold outpatient metformin  #Acute Metabolic Encephalopathy in setting of sepsis and multiple metabolic derangements #?CVA PMHx: Depression and Anxiety CT Head 8/23 negative for acute intracranial abnormality Code stroke called 8/23, deemed not a candidate for thrombolytics given unclear last known well time -Treatment of Sepsis and metabolic derangements as outlined above -Provide supportive care -Promote normal sleep/wake cycle and family presence -Avoid sedating medications as able -Hold Klonopin, Buspar, Lexapro for now  Left sided weakness, Left Facial droop  -CTA head and neck reviewed and shows no acute intracranial abnormality or LVO -Check HgbA1c, fasting lipid panel -Obtain MRI  of the brain without contrast -PT consult, OT consult, Speech consult -Echocardiogram -Neurology consult placed    Patient is critically ill with bacteremia and shock.  Prognosis is guarded, high risk for further decompensation, cardiac arrest and death.  Given current illness superimposed on advanced age and  multiple chronic comorbidities, overall long-term prognosis is poor.  Recommend DNR status.   Best Practice (right click and "Reselect all SmartList Selections" daily)   Diet/type: NPO DVT prophylaxis: prophylactic heparin  GI prophylaxis: PPI Lines: right radial A-line, left femoral Trialysis (both still needed) Foley:  N/A Code Status:  full code Last date of multidisciplinary goals of care discussion [N/A]  Labs   CBC: Recent Labs  Lab 12/05/22 2056 12/06/22 0404 12/06/22 0734 12/06/22 1934 12/07/22 0213  WBC 21.4* 16.0* 16.1*  --  22.5*  NEUTROABS 19.4*  --   --   --   --   HGB 10.2* 8.6* 8.6* 8.2* 8.8*  HCT 33.4* 28.4* 28.1* 26.6* 28.3*  MCV 84.3 85.5 85.4  --  83.0  PLT 411* 352 377  --  293    Basic Metabolic Panel: Recent Labs  Lab 12/05/22 2056 12/06/22 0354 12/06/22 0404 12/06/22 1614 12/07/22 0213  NA 128* 133* 134* 132* 132*  K 4.2 4.1 4.1 3.5 3.2*  CL 90* 99 100 100 103  CO2 22 19* 23  20* 20*  GLUCOSE 197* 103* 108* 129* 149*  BUN 67* 66* 62* 54* 32*  CREATININE 4.53* 4.34* 4.23* 3.26* 1.97*  CALCIUM 9.6 8.7* 8.4* 8.0* 8.7*  MG  --   --   --   --  1.9  PHOS  --  2.8  --  2.9 2.5   GFR: Estimated Creatinine Clearance: 26.8 mL/min (A) (by C-G formula based on SCr of 1.97 mg/dL (H)). Recent Labs  Lab 12/05/22 2056 12/06/22 0016 12/06/22 0404 12/06/22 0734 12/06/22 0735 12/06/22 1257 12/07/22 0213  PROCALCITON  --   --  2.01  --   --   --  1.51  WBC 21.4*  --  16.0* 16.1*  --   --  22.5*  LATICACIDVEN 2.3* 2.9* 2.0*  --  1.9 1.7  --     Liver Function Tests: Recent Labs  Lab 12/05/22 2056 12/06/22 0354 12/06/22 1614 12/07/22 0213  AST 66*  --   --   --   ALT 87*  --   --   --   ALKPHOS 157*  --   --   --   BILITOT 0.6  --   --   --   PROT 8.6*  --   --   --   ALBUMIN 3.3* 2.5* 2.3* 1.9*   No results for input(s): "LIPASE", "AMYLASE" in the last 168 hours. No results for input(s): "AMMONIA" in the last 168 hours.  ABG     Component Value Date/Time   PHART 7.40 05/14/2021 0341   PCO2ART 45 05/14/2021 0341   PO2ART 129 (H) 05/14/2021 0341   HCO3 27.9 05/14/2021 0341   ACIDBASEDEF 12.8 (H) 05/11/2021 0603   O2SAT 98.9 05/14/2021 0341     Coagulation Profile: Recent Labs  Lab 12/05/22 2056 12/06/22 0404  INR 1.1 1.2    Cardiac Enzymes: No results for input(s): "CKTOTAL", "CKMB", "CKMBINDEX", "TROPONINI" in the last 168 hours.  HbA1C: Hgb A1c MFr Bld  Date/Time Value Ref Range Status  05/05/2021 04:53 AM 6.7 (H) 4.8 - 5.6 % Final    Comment:    (NOTE)         Prediabetes: 5.7 - 6.4         Diabetes: >6.4         Glycemic control for adults with diabetes: <7.0   02/11/2021 09:41 AM 6.3 (H) <5.7 % of total Hgb Final    Comment:    For someone without known diabetes, a hemoglobin  A1c value between 5.7% and 6.4% is consistent with prediabetes and should be confirmed with a  follow-up test. . For someone with known diabetes, a value <7% indicates that their diabetes is well controlled. A1c targets should be individualized based on duration of diabetes, age, comorbid conditions, and other considerations. . This assay result is consistent with an increased risk of diabetes. . Currently, no consensus exists regarding use of hemoglobin A1c for diagnosis of diabetes for children. .     CBG: Recent Labs  Lab 12/05/22 2107 12/06/22 1916 12/06/22 2336 12/07/22 0402  GLUCAP 178* 107* 134* 162*    Review of Systems:   Unable to assess due to AMS  Past Medical History:  She,  has a past medical history of AKI (acute kidney injury) (HCC), Arthritis, Asthma, Bacteremia, COPD (chronic obstructive pulmonary disease) (HCC), Diabetes mellitus without complication (HCC), Endometriosis, GERD (gastroesophageal reflux disease), History of echocardiogram, Hypertension, Obesity, PAF (paroxysmal atrial fibrillation) (HCC), and Sleep apnea.   Surgical History:   Past  Surgical History:  Procedure  Laterality Date   ABDOMINAL HYSTERECTOMY     APPLICATION OF WOUND VAC Left 05/17/2021   Procedure: APPLICATION OF WOUND VAC/WOUND VAC EXCHANGE-Matrix Myriad;  Surgeon: Carolan Shiver, MD;  Location: [facility];  Service: General;  Laterality: Left;   APPLICATION OF WOUND VAC  05/24/2021   Procedure: APPLICATION OF WOUND VAC;  Surgeon: Carolan Shiver, MD;  Location: [facility];  Service: General;;   APPLICATION OF WOUND VAC  05/10/2021   Procedure: APPLICATION OF WOUND VAC;  Surgeon: Carolan Shiver, MD;  Location: [facility];  Service: General;;   COLONOSCOPY  10/29/2006   Dr Servando Snare   COLONOSCOPY WITH PROPOFOL N/A 11/05/2016   Procedure: COLONOSCOPY WITH PROPOFOL;  Surgeon: Earline Mayotte, MD;  Location: [facility];  Service: Endoscopy;  Laterality: N/A;   INCISION AND DRAINAGE OF WOUND Left 05/24/2021   Procedure: IRRIGATION AND DEBRIDEMENT LEFT LEG;  Surgeon: Carolan Shiver, MD;  Location: [facility];  Service: General;  Laterality: Left;   INCISION AND DRAINAGE OF WOUND Left 05/10/2021   Procedure: IRRIGATION AND DEBRIDEMENT LEFT LEG;  Surgeon: Carolan Shiver, MD;  Location: [facility];  Service: General;  Laterality: Left;   IR FLUORO GUIDE CV LINE RIGHT  06/12/2021   IR PERC TUN PERIT CATH WO PORT S&I /IMAG  06/12/2021   IR REPLC GASTRO/COLONIC TUBE PERCUT W/FLUORO  06/12/2021   IVC FILTER INSERTION N/A 05/29/2021   Procedure: IVC FILTER INSERTION;  Surgeon: Renford Dills, MD;  Location: [facility];  Service: Cardiovascular;  Laterality: N/A;   MINOR GRAFT APPLICATION  05/24/2021   Procedure: Myriad Matrix  APPLICATION;  Surgeon: Carolan Shiver, MD;  Location: [facility];  Service: General;;   NASAL SINUS SURGERY  2002   Dr Chestine Spore   PEG PLACEMENT N/A 05/28/2021   Procedure: PERCUTANEOUS ENDOSCOPIC GASTROSTOMY (PEG) PLACEMENT;  Surgeon: Sung Amabile, DO;  Location: [facility];  Service: General;  Laterality: N/A;  TRAVEL CASE   TRACHEOSTOMY TUBE  PLACEMENT N/A 05/17/2021   Procedure: TRACHEOSTOMY;  Surgeon: Geanie Logan, MD;  Location: [facility];  Service: ENT;  Laterality: N/A;   WOUND DEBRIDEMENT Left 05/07/2021   Procedure: DEBRIDEMENT WOUND;  Surgeon: Carolan Shiver, MD;  Location: [facility];  Service: General;  Laterality: Left;     Social History:   reports that she quit smoking about 21 years ago. Her smoking use included cigarettes. She started smoking about 61 years ago. She has a 80 pack-year smoking history. She quit smokeless tobacco use about 21 years ago.  Her smokeless tobacco use included snuff. She reports that she does not drink alcohol and does not use drugs.   Family History:  Her family history includes Alcohol abuse in her brother; Breast cancer (age of onset: 3) in her sister; Congestive Heart Failure in her mother; Coronary artery disease (age of onset: 80) in her father; Heart disease in her brother; Varicose Veins in her brother.   Allergies No Known Allergies   Home Medications  Prior to Admission medications   Medication Sig Start Date End Date Taking? Authorizing Provider  amiodarone (PACERONE) 200 MG tablet Place 1 tablet (200 mg total) into feeding tube 2 (two) times daily. 06/13/21  Yes Erin Fulling, MD  ascorbic acid (VITAMIN C) 500 MG tablet Place 1 tablet (500 mg total) into feeding tube 2 (two) times daily. 06/13/21  Yes Kasa, Wallis Bamberg, MD  busPIRone (BUSPAR) 5 MG tablet Take 5 mg by mouth 2 (two) times daily. 12/04/22  Yes [provider]  cholecalciferol (VITAMIN D) 25 MCG tablet Place 2 tablets (2,000 Units total) into feeding tube daily. 06/14/21  Yes Kasa, Wallis Bamberg, MD  clonazePAM (KLONOPIN) 0.5 MG tablet Take 0.25-0.5 mg by mouth 2 (two) times daily. 0.25 mg every morning and 0.5 mg at bedtime for Agitation 11/18/22  Yes [provider]  escitalopram (LEXAPRO) 10 MG tablet Place 1 tablet (10 mg total) into feeding tube daily. 06/14/21  Yes Erin Fulling, MD  hydrOXYzine (ATARAX) 50 MG  tablet Take 100 mg by mouth 2 (two) times daily. 12/02/22  Yes [provider]  lip balm (BLISTEX) OINT Apply 1 application topically 3 (three) times daily. 06/13/21  Yes Erin Fulling, MD  loperamide (IMODIUM) 2 MG capsule Take 1 capsule (2 mg total) by mouth as needed for diarrhea or loose stools. 06/13/21  Yes Erin Fulling, MD  midodrine (PROAMATINE) 5 MG tablet Place 3 tablets (15 mg total) into feeding tube 3 (three) times daily with meals. 06/13/21  Yes Erin Fulling, MD  multivitamin (RENA-VIT) TABS tablet Place 1 tablet into feeding tube at bedtime. 06/13/21  Yes Erin Fulling, MD  omeprazole (PRILOSEC) 20 MG capsule Take 20 mg by mouth daily. 11/12/22  Yes [provider]  sennosides (SENOKOT) 8.8 MG/5ML syrup Place 10 mLs into feeding tube at bedtime as needed for moderate constipation. 06/13/21  Yes Erin Fulling, MD  artificial tears (LACRILUBE) OINT ophthalmic ointment Place into both eyes every 4 (four) hours as needed for dry eyes. Patient not taking: Reported on 12/06/2022 06/13/21   Erin Fulling, MD  chlorhexidine gluconate, MEDLINE KIT, (PERIDEX) 0.12 % solution 15 mLs by Mouth Rinse route 2 (two) times daily. Patient not taking: Reported on 12/06/2022 06/13/21   Erin Fulling, MD  collagenase (SANTYL) ointment Apply topically daily. Patient not taking: Reported on 12/06/2022 06/14/21   Erin Fulling, MD  dextrose 5 % SOLN 100 mL with copper chloride 0.4 MG/ML SOLN 2 mg Inject 2 mg into the vein daily. 06/14/21   Erin Fulling, MD  ipratropium-albuterol (DUONEB) 0.5-2.5 (3) MG/3ML SOLN Take 3 mLs by nebulization 3 (three) times daily. Patient not taking: Reported on 12/06/2022 06/13/21   Erin Fulling, MD  lactobacillus (FLORANEX/LACTINEX) PACK Place 1 packet (1 g total) into feeding tube 3 (three) times daily with meals. Patient not taking: Reported on 12/06/2022 06/13/21   Erin Fulling, MD  pantoprazole sodium (PROTONIX) 40 mg Place 40 mg into feeding tube daily. Patient not taking: Reported on  12/06/2022 06/13/21   Erin Fulling, MD  Scheduled Meds:  amiodarone  200 mg Per Tube BID   ascorbic acid  500 mg Per Tube BID   chlorhexidine  15 mL Mouth Rinse BID   Chlorhexidine Gluconate Cloth  6 each Topical Q0600   Chlorhexidine Gluconate Cloth  6 each Topical Q0600   vitamin D3  2,000 Units Per Tube Daily   escitalopram  10 mg Per Tube Daily   famotidine  40 mg Per Tube Daily   heparin injection (subcutaneous)  5,000 Units Subcutaneous Q8H   lactobacillus  1 g Per Tube TID WC   midodrine  15 mg Per Tube TID WC   multivitamin  1 tablet Per Tube QHS   mupirocin ointment   Nasal BID   Continuous Infusions:   prismasol BGK 4/2.5 400 mL/hr at 12/07/22 0309    prismasol BGK 4/2.5 400 mL/hr at 12/07/22 0307   sodium chloride Stopped (12/07/22 0210)   ceFEPime (MAXIPIME) IV 2 g (12/07/22 0209)   heparin 10,000  units/ 20 mL infusion syringe 500 Units/hr (12/07/22 0603)   norepinephrine (LEVOPHED) Adult infusion 8 mcg/min (12/07/22 0600)   prismasol BGK 4/2.5 2,000 mL/hr at 12/07/22 0307   vancomycin     PRN Meds:.sodium chloride, acetaminophen **OR** acetaminophen, artificial tears, heparin, heparin, ipratropium-albuterol, loperamide HCl, magnesium hydroxide, ondansetron **OR** ondansetron (ZOFRAN) IV, mouth rinse, sennosides, sodium chloride, traZODone   Critical care time: 55 minutes    Webb Silversmith, DNP, CCRN, FNP-C, AGACNP-BC Acute Care & Family Nurse Practitioner  [facility]  See Amion for personal pager PCCM on call pager [phone] until 7 am

## 2022-12-08 ENCOUNTER — Inpatient Hospital Stay: Payer: Medicare Other

## 2022-12-08 DIAGNOSIS — I480 Paroxysmal atrial fibrillation: Secondary | ICD-10-CM | POA: Diagnosis not present

## 2022-12-08 DIAGNOSIS — G9341 Metabolic encephalopathy: Secondary | ICD-10-CM | POA: Diagnosis not present

## 2022-12-08 DIAGNOSIS — Z992 Dependence on renal dialysis: Secondary | ICD-10-CM

## 2022-12-08 DIAGNOSIS — A4102 Sepsis due to Methicillin resistant Staphylococcus aureus: Secondary | ICD-10-CM | POA: Diagnosis not present

## 2022-12-08 DIAGNOSIS — N186 End stage renal disease: Secondary | ICD-10-CM

## 2022-12-08 LAB — CBC
HCT: 21.4 % — ABNORMAL LOW (ref 36.0–46.0)
HCT: 21.7 % — ABNORMAL LOW (ref 36.0–46.0)
Hemoglobin: 6.7 g/dL — ABNORMAL LOW (ref 12.0–15.0)
Hemoglobin: 6.7 g/dL — ABNORMAL LOW (ref 12.0–15.0)
MCH: 25.9 pg — ABNORMAL LOW (ref 26.0–34.0)
MCH: 25.9 pg — ABNORMAL LOW (ref 26.0–34.0)
MCHC: 31.3 g/dL (ref 30.0–36.0)
MCHC: 30.9 g/dL (ref 30.0–36.0)
MCV: 82.6 fL (ref 80.0–100.0)
MCV: 83.8 fL (ref 80.0–100.0)
Platelets: 242 10*3/uL (ref 150–400)
Platelets: 256 10*3/uL (ref 150–400)
RBC: 2.59 MIL/uL — ABNORMAL LOW (ref 3.87–5.11)
RBC: 2.59 MIL/uL — ABNORMAL LOW (ref 3.87–5.11)
RDW: 15.6 % — ABNORMAL HIGH (ref 11.5–15.5)
RDW: 15.6 % — ABNORMAL HIGH (ref 11.5–15.5)
WBC: 34.6 10*3/uL — ABNORMAL HIGH (ref 4.0–10.5)
WBC: 34.5 10*3/uL — ABNORMAL HIGH (ref 4.0–10.5)
nRBC: 1.9 % — ABNORMAL HIGH (ref 0.0–0.2)
nRBC: 2 % — ABNORMAL HIGH (ref 0.0–0.2)

## 2022-12-08 LAB — MAGNESIUM
Magnesium: 2.3 mg/dL (ref 1.7–2.4)
Magnesium: 2.2 mg/dL (ref 1.7–2.4)

## 2022-12-08 LAB — RENAL FUNCTION PANEL
Albumin: 2 g/dL — ABNORMAL LOW (ref 3.5–5.0)
Anion gap: 9 (ref 5–15)
BUN: 41 mg/dL — ABNORMAL HIGH (ref 8–23)
CO2: 21 mmol/L — ABNORMAL LOW (ref 22–32)
Calcium: 8.2 mg/dL — ABNORMAL LOW (ref 8.9–10.3)
Chloride: 100 mmol/L (ref 98–111)
Creatinine, Ser: 2.38 mg/dL — ABNORMAL HIGH (ref 0.44–1.00)
GFR, Estimated: 21 mL/min — ABNORMAL LOW (ref >60)
Glucose, Bld: 178 mg/dL — ABNORMAL HIGH (ref 70–99)
Phosphorus: 1.6 mg/dL — ABNORMAL LOW (ref 2.5–4.6)
Potassium: 3.6 mmol/L (ref 3.5–5.1)
Sodium: 130 mmol/L — ABNORMAL LOW (ref 135–145)

## 2022-12-08 LAB — BASIC METABOLIC PANEL
Anion gap: 10 (ref 5–15)
BUN: 38 mg/dL — ABNORMAL HIGH (ref 8–23)
CO2: 20 mmol/L — ABNORMAL LOW (ref 22–32)
Calcium: 8.2 mg/dL — ABNORMAL LOW (ref 8.9–10.3)
Chloride: 102 mmol/L (ref 98–111)
Creatinine, Ser: 2.57 mg/dL — ABNORMAL HIGH (ref 0.44–1.00)
GFR, Estimated: 19 mL/min — ABNORMAL LOW (ref >60)
Glucose, Bld: 184 mg/dL — ABNORMAL HIGH (ref 70–99)
Potassium: 3.7 mmol/L (ref 3.5–5.1)
Sodium: 132 mmol/L — ABNORMAL LOW (ref 135–145)

## 2022-12-08 LAB — APTT: aPTT: 35 s (ref 24–36)

## 2022-12-08 LAB — CULTURE, BLOOD (ROUTINE X 2)
Special Requests: ADEQUATE
Special Requests: ADEQUATE

## 2022-12-08 LAB — CATH TIP CULTURE: Culture: >=100000 — AB

## 2022-12-08 LAB — HEMOGLOBIN AND HEMATOCRIT, BLOOD
HCT: 23.6 % — ABNORMAL LOW (ref 36.0–46.0)
HCT: 23.2 % — ABNORMAL LOW (ref 36.0–46.0)
Hemoglobin: 7.7 g/dL — ABNORMAL LOW (ref 12.0–15.0)
Hemoglobin: 7.6 g/dL — ABNORMAL LOW (ref 12.0–15.0)

## 2022-12-08 LAB — VITAMIN D 25 HYDROXY (VIT D DEFICIENCY, FRACTURES): Vit D, 25-Hydroxy: 34.2 ng/mL (ref 30–100)

## 2022-12-08 LAB — GLUCOSE, CAPILLARY
Glucose-Capillary: 170 mg/dL — ABNORMAL HIGH (ref 70–99)
Glucose-Capillary: 163 mg/dL — ABNORMAL HIGH (ref 70–99)
Glucose-Capillary: 168 mg/dL — ABNORMAL HIGH (ref 70–99)
Glucose-Capillary: 127 mg/dL — ABNORMAL HIGH (ref 70–99)
Glucose-Capillary: 145 mg/dL — ABNORMAL HIGH (ref 70–99)

## 2022-12-08 LAB — VANCOMYCIN, RANDOM: Vancomycin Rm: 22 ug/mL

## 2022-12-08 LAB — PREPARE RBC (CROSSMATCH)

## 2022-12-08 LAB — PROCALCITONIN: Procalcitonin: 4.43 ng/mL

## 2022-12-08 LAB — PHOSPHORUS
Phosphorus: 1.7 mg/dL — ABNORMAL LOW (ref 2.5–4.6)
Phosphorus: 1.7 mg/dL — ABNORMAL LOW (ref 2.5–4.6)

## 2022-12-08 MED ORDER — SODIUM CHLORIDE 0.9% IV SOLUTION: Freq: Once | INTRAVENOUS | Status: AC

## 2022-12-08 MED ORDER — ORAL CARE MOUTH RINSE: 15.0000 mL | OROMUCOSAL | Status: DC | PRN

## 2022-12-08 MED ORDER — SODIUM CHLORIDE 0.9 % IV SOLN: 250.0000 mL | INTRAVENOUS | Status: DC

## 2022-12-08 MED ORDER — INSULIN ASPART 100 UNIT/ML IJ SOLN
0.0000 [IU] | INTRAMUSCULAR | Status: DC
  Administered 2022-12-08 – 2022-12-30 (×22): 1 [IU] via SUBCUTANEOUS
  Filled 2022-12-08 (×26): qty 1

## 2022-12-08 MED ORDER — FAMOTIDINE 20 MG PO TABS
10.0000 mg | ORAL_TABLET | Freq: Every day | ORAL | Status: DC
  Administered 2022-12-09 – 2023-01-03 (×25): 10 mg
  Filled 2022-12-08 (×25): qty 1
  Filled 2022-12-08: qty 0.5

## 2022-12-08 MED ORDER — ORAL CARE MOUTH RINSE: 15.0000 mL | OROMUCOSAL | Status: DC

## 2022-12-08 MED ORDER — JUVEN PO PACK
1.0000 | PACK | Freq: Two times a day (BID) | ORAL | Status: DC
  Administered 2022-12-09 – 2022-12-22 (×26): 1

## 2022-12-08 MED ORDER — NEPRO/CARBSTEADY PO LIQD
1000.0000 mL | ORAL | Status: DC
  Administered 2022-12-08 – 2022-12-22 (×16): 1000 mL

## 2022-12-08 MED ORDER — SODIUM PHOSPHATES 45 MMOLE/15ML IV SOLN
15.0000 mmol | Freq: Once | INTRAVENOUS | Status: AC
  Administered 2022-12-08: 15 mmol via INTRAVENOUS
  Filled 2022-12-08: qty 5

## 2022-12-08 MED ORDER — VANCOMYCIN VARIABLE DOSE PER UNSTABLE RENAL FUNCTION (PHARMACIST DOSING): Status: DC

## 2022-12-08 MED ORDER — FREE WATER
30.0000 mL | Status: DC
  Administered 2022-12-08 – 2022-12-23 (×84): 30 mL

## 2022-12-08 MED ORDER — HYDROXYZINE HCL 25 MG PO TABS: 50.0000 mg | ORAL_TABLET | Freq: Four times a day (QID) | ORAL | Status: DC

## 2022-12-08 MED ORDER — ORAL CARE MOUTH RINSE
15.0000 mL | OROMUCOSAL | Status: DC
  Administered 2022-12-08 – 2022-12-29 (×53): 15 mL via OROMUCOSAL

## 2022-12-08 MED ORDER — HYDROXYZINE HCL 50 MG PO TABS
50.0000 mg | ORAL_TABLET | Freq: Four times a day (QID) | ORAL | Status: DC
  Administered 2022-12-08 – 2022-12-24 (×64): 50 mg
  Filled 2022-12-08: qty 1
  Filled 2022-12-08 (×3): qty 2
  Filled 2022-12-08 (×2): qty 1
  Filled 2022-12-08 (×7): qty 2
  Filled 2022-12-08: qty 1
  Filled 2022-12-08 (×5): qty 2
  Filled 2022-12-08: qty 1
  Filled 2022-12-08 (×2): qty 2
  Filled 2022-12-08 (×2): qty 1
  Filled 2022-12-08: qty 2
  Filled 2022-12-08 (×2): qty 1
  Filled 2022-12-08 (×2): qty 2
  Filled 2022-12-08: qty 1
  Filled 2022-12-08 (×12): qty 2
  Filled 2022-12-08: qty 1
  Filled 2022-12-08 (×3): qty 2
  Filled 2022-12-08: qty 1
  Filled 2022-12-08 (×2): qty 2
  Filled 2022-12-08: qty 1
  Filled 2022-12-08: qty 2
  Filled 2022-12-08: qty 1
  Filled 2022-12-08 (×2): qty 2
  Filled 2022-12-08: qty 1
  Filled 2022-12-08: qty 2
  Filled 2022-12-08: qty 1
  Filled 2022-12-08 (×4): qty 2
  Filled 2022-12-08: qty 1
  Filled 2022-12-08: qty 2
  Filled 2022-12-08: qty 1
  Filled 2022-12-08: qty 2

## 2022-12-08 MED ORDER — HYDROCORTISONE SOD SUC (PF) 100 MG IJ SOLR
50.0000 mg | Freq: Four times a day (QID) | INTRAMUSCULAR | Status: DC
  Administered 2022-12-08 – 2022-12-10 (×8): 50 mg via INTRAVENOUS
  Filled 2022-12-08 (×8): qty 2

## 2022-12-08 MED ORDER — NOREPINEPHRINE 4 MG/250ML-% IV SOLN
2.0000 ug/min | INTRAVENOUS | Status: DC
  Administered 2022-12-08: 2 ug/min via INTRAVENOUS
  Administered 2022-12-09: 4 ug/min via INTRAVENOUS
  Administered 2022-12-09: 7 ug/min via INTRAVENOUS
  Administered 2022-12-10: 1 ug/min via INTRAVENOUS
  Administered 2022-12-12: 2 ug/min via INTRAVENOUS
  Administered 2022-12-12: 8 ug/min via INTRAVENOUS
  Administered 2022-12-13: 10 ug/min via INTRAVENOUS
  Administered 2022-12-13: 9 ug/min via INTRAVENOUS
  Administered 2022-12-13: 8 ug/min via INTRAVENOUS
  Administered 2022-12-13: 9 ug/min via INTRAVENOUS
  Administered 2022-12-14: 7 ug/min via INTRAVENOUS
  Administered 2022-12-14: 8 ug/min via INTRAVENOUS
  Administered 2022-12-15: 7 ug/min via INTRAVENOUS
  Administered 2022-12-15: 8 ug/min via INTRAVENOUS
  Administered 2022-12-15: 7 ug/min via INTRAVENOUS
  Administered 2022-12-16: 6 ug/min via INTRAVENOUS
  Administered 2022-12-17: 2 ug/min via INTRAVENOUS
  Filled 2022-12-08 (×18): qty 250

## 2022-12-08 NOTE — Progress Notes (Signed)
Pharmacy Antibiotic Note  Alexandra Foster is a 72 y.o. female admitted on 12/05/2022 with bacteremia.  Bcx 4 of 4 GPC, BCID showing MRSA and Staph epidermidis. Patient started on CRRT 8/24 - stopped 8/25. Pharmacy has been consulted for vancomycin dosing and monitoring.   Plan:  Discontinue scheduled vancomycin. CRRT discontinued. Plan is for line holiday. Will check a random vancomycin level today to assess for need to re-dose.  Height: 5\' 4"  (162.6 cm) Weight: 90.3 kg (199 lb 1.2 oz) IBW/kg (Calculated) : 54.7  Temp (24hrs), Avg:98.1 F (36.7 C), Min:97.7 F (36.5 C), Max:98.4 F (36.9 C)  Recent Labs  Lab 12/05/22 2056 12/06/22 0016 12/06/22 0354 12/06/22 0404 12/06/22 0734 12/06/22 0735 12/06/22 1257 12/06/22 1614 12/07/22 0213 12/07/22 1608 12/08/22 0525 12/08/22 0613  WBC 21.4*  --   --  16.0* 16.1*  --   --   --  22.5*  --  34.6* 34.5*  CREATININE 4.53*  --    < > 4.23*  --   --   --  3.26* 1.97* 1.82* 2.38* 2.57*  LATICACIDVEN 2.3* 2.9*  --  2.0*  --  1.9 1.7  --   --   --   --   --    < > = values in this interval not displayed.    Estimated Creatinine Clearance: 20.9 mL/min (A) (by C-G formula based on SCr of 2.57 mg/dL (H)).    No Known Allergies  Antimicrobials this admission: 8/23 Azithromycin / CRO x 1 8/24 Cefepime >> 8/26 8/24 Vancomycin >>   Microbiology results: 8/23 BCx: 4/4 MRSA 8/23 Cath tip: MRSA 8/24 MRSA PCR: Positive   Thank you for allowing pharmacy to be a part of this patient's care.  Tressie Ellis 12/08/2022 3:23 PM

## 2022-12-08 NOTE — Plan of Care (Signed)
Problem: Fluid Volume: Goal: Hemodynamic stability will improve 12/08/2022 2332 by Dorma Russell, RN Outcome: Progressing 12/08/2022 2104 by Dorma Russell, RN Outcome: Progressing   Problem: Clinical Measurements: Goal: Diagnostic test results will improve 12/08/2022 2332 by Dorma Russell, RN Outcome: Progressing 12/08/2022 2104 by Dorma Russell, RN Outcome: Progressing Goal: Signs and symptoms of infection will decrease 12/08/2022 2332 by Dorma Russell, RN Outcome: Progressing 12/08/2022 2104 by Dorma Russell, RN Outcome: Progressing   Problem: Respiratory: Goal: Ability to maintain adequate ventilation will improve 12/08/2022 2332 by Dorma Russell, RN Outcome: Progressing 12/08/2022 2104 by Dorma Russell, RN Outcome: Progressing   Problem: Education: Goal: Knowledge of General Education information will improve Description: Including pain rating scale, medication(s)/side effects and non-pharmacologic comfort measures 12/08/2022 2332 by Dorma Russell, RN Outcome: Progressing 12/08/2022 2104 by Dorma Russell, RN Outcome: Progressing   Problem: Health Behavior/Discharge Planning: Goal: Ability to manage health-related needs will improve 12/08/2022 2332 by Dorma Russell, RN Outcome: Progressing 12/08/2022 2104 by Dorma Russell, RN Outcome: Progressing   Problem: Clinical Measurements: Goal: Ability to maintain clinical measurements within normal limits will improve 12/08/2022 2332 by Dorma Russell, RN Outcome: Progressing 12/08/2022 2104 by Dorma Russell, RN Outcome: Progressing Goal: Will remain free from infection 12/08/2022 2332 by Dorma Russell, RN Outcome: Progressing 12/08/2022 2104 by Dorma Russell, RN Outcome: Progressing Goal: Diagnostic test results will improve 12/08/2022 2332 by Dorma Russell, RN Outcome: Progressing 12/08/2022 2104 by Dorma Russell, RN Outcome: Progressing Goal: Respiratory complications will improve 12/08/2022  2332 by Dorma Russell, RN Outcome: Progressing 12/08/2022 2104 by Dorma Russell, RN Outcome: Progressing Goal: Cardiovascular complication will be avoided 12/08/2022 2332 by Dorma Russell, RN Outcome: Progressing 12/08/2022 2104 by Dorma Russell, RN Outcome: Progressing   Problem: Activity: Goal: Risk for activity intolerance will decrease 12/08/2022 2332 by Dorma Russell, RN Outcome: Progressing 12/08/2022 2104 by Dorma Russell, RN Outcome: Progressing   Problem: Nutrition: Goal: Adequate nutrition will be maintained 12/08/2022 2332 by Dorma Russell, RN Outcome: Progressing 12/08/2022 2104 by Dorma Russell, RN Outcome: Progressing   Problem: Coping: Goal: Level of anxiety will decrease 12/08/2022 2332 by Dorma Russell, RN Outcome: Progressing 12/08/2022 2104 by Dorma Russell, RN Outcome: Progressing   Problem: Elimination: Goal: Will not experience complications related to bowel motility 12/08/2022 2332 by Dorma Russell, RN Outcome: Progressing 12/08/2022 2104 by Dorma Russell, RN Outcome: Progressing Goal: Will not experience complications related to urinary retention 12/08/2022 2332 by Dorma Russell, RN Outcome: Progressing 12/08/2022 2104 by Dorma Russell, RN Outcome: Progressing   Problem: Pain Managment: Goal: General experience of comfort will improve 12/08/2022 2332 by Dorma Russell, RN Outcome: Progressing 12/08/2022 2104 by Dorma Russell, RN Outcome: Progressing   Problem: Safety: Goal: Ability to remain free from injury will improve 12/08/2022 2332 by Dorma Russell, RN Outcome: Progressing 12/08/2022 2104 by Dorma Russell, RN Outcome: Progressing   Problem: Skin Integrity: Goal: Risk for impaired skin integrity will decrease 12/08/2022 2332 by Dorma Russell, RN Outcome: Progressing 12/08/2022 2104 by Dorma Russell, RN Outcome: Progressing   Problem: Education: Goal: Ability to describe self-care measures that may prevent  or decrease complications (Diabetes Survival Skills Education) will improve 12/08/2022 2332 by Dorma Russell, RN Outcome: Progressing 12/08/2022 2104 by Dorma Russell, RN Outcome: Progressing Goal: Individualized Educational Video(s) 12/08/2022 2332 by Dorma Russell,  RN Outcome: Progressing 12/08/2022 2104 by Dorma Russell, RN Outcome: Progressing   Problem: Coping: Goal: Ability to adjust to condition or change in health will improve 12/08/2022 2332 by Dorma Russell, RN Outcome: Progressing 12/08/2022 2104 by Dorma Russell, RN Outcome: Progressing   Problem: Fluid Volume: Goal: Ability to maintain a balanced intake and output will improve 12/08/2022 2332 by Dorma Russell, RN Outcome: Progressing 12/08/2022 2104 by Dorma Russell, RN Outcome: Progressing   Problem: Health Behavior/Discharge Planning: Goal: Ability to identify and utilize available resources and services will improve 12/08/2022 2332 by Dorma Russell, RN Outcome: Progressing 12/08/2022 2104 by Dorma Russell, RN Outcome: Progressing Goal: Ability to manage health-related needs will improve 12/08/2022 2332 by Dorma Russell, RN Outcome: Progressing 12/08/2022 2104 by Dorma Russell, RN Outcome: Progressing   Problem: Metabolic: Goal: Ability to maintain appropriate glucose levels will improve 12/08/2022 2332 by Dorma Russell, RN Outcome: Progressing 12/08/2022 2104 by Dorma Russell, RN Outcome: Progressing   Problem: Nutritional: Goal: Maintenance of adequate nutrition will improve 12/08/2022 2332 by Dorma Russell, RN Outcome: Progressing 12/08/2022 2104 by Dorma Russell, RN Outcome: Progressing Goal: Progress toward achieving an optimal weight will improve 12/08/2022 2332 by Dorma Russell, RN Outcome: Progressing 12/08/2022 2104 by Dorma Russell, RN Outcome: Progressing   Problem: Skin Integrity: Goal: Risk for impaired skin integrity will decrease 12/08/2022 2332 by Dorma Russell, RN Outcome: Progressing 12/08/2022 2104 by Dorma Russell, RN Outcome: Progressing   Problem: Tissue Perfusion: Goal: Adequacy of tissue perfusion will improve 12/08/2022 2332 by Dorma Russell, RN Outcome: Progressing 12/08/2022 2104 by Dorma Russell, RN Outcome: Progressing

## 2022-12-08 NOTE — Progress Notes (Signed)
Pharmacy Antibiotic Note  Alexandra Foster is a 72 y.o. female admitted on 12/05/2022 with bacteremia.  Bcx 4 of 4 GPC, BCID showing MRSA and Staph epidermidis. Patient started on CRRT 8/24 - stopped 8/25. Pharmacy has been consulted for vancomycin dosing and monitoring.   Plan: 12/08/22:  Discontinue scheduled vancomycin. CRRT discontinued. Plan is for line holiday. Level assessment: 12/08/22  1130 Vancomycin random level = 22 . Within goal Follow up  plans to resume RRT for next vancomycin dose   Height: 5\' 4"  (162.6 cm) Weight: 90.3 kg (199 lb 1.2 oz) IBW/kg (Calculated) : 54.7  Temp (24hrs), Avg:98.2 F (36.8 C), Min:97.7 F (36.5 C), Max:98.4 F (36.9 C)  Recent Labs  Lab 12/05/22 2056 12/06/22 0016 12/06/22 0354 12/06/22 0404 12/06/22 0734 12/06/22 0735 12/06/22 1257 12/06/22 1614 12/07/22 0213 12/07/22 1608 12/08/22 0525 12/08/22 0613 12/08/22 1130  WBC 21.4*  --   --  16.0* 16.1*  --   --   --  22.5*  --  34.6* 34.5*  --   CREATININE 4.53*  --    < > 4.23*  --   --   --  3.26* 1.97* 1.82* 2.38* 2.57*  --   LATICACIDVEN 2.3* 2.9*  --  2.0*  --  1.9 1.7  --   --   --   --   --   --   VANCORANDOM  --   --   --   --   --   --   --   --   --   --   --   --  22   < > = values in this interval not displayed.    Estimated Creatinine Clearance: 20.9 mL/min (A) (by C-G formula based on SCr of 2.57 mg/dL (H)).    No Known Allergies  Antimicrobials this admission: 8/23 Azithromycin / CRO x 1 8/24 Cefepime >> 8/26 8/24 Vancomycin >>   Microbiology results: 8/23 BCx: 4/4 MRSA 8/23 Cath tip: MRSA 8/24 MRSA PCR: Positive   Thank you for allowing pharmacy to be a part of this patient's care.  Sharen Hones, PharmD, BCPS Clinical Pharmacist   12/08/2022 6:09 PM

## 2022-12-08 NOTE — Progress Notes (Signed)
NAME:  Alexandra Foster, MRN:  [id number], DOB:  05/08/1950, LOS: 2 ADMISSION DATE:  12/05/2022, CONSULTATION DATE:  12/06/2022 REFERRING MD:  Dr. Wynelle Link, CHIEF COMPLAINT:  Hypotension   Brief Pt Description / Synopsis:  72 y.o female with PMHx significant for ESRD on HD admitted with Acute Metabolic Encephalopathy in setting of Severe Sepsis with Septic Shock due to MRSA & Staph Epi Bacteremia (suspect from Right chest Permcath) along with UTI.  History of Present Illness:  Alexandra Foster is a 72 y.o. female with past medical history significant for asthma, COPD, type diabetes mellitus, s/p G-tube, GERD, ESRD on HD, hypertension, osteoarthritis, paroxysmal atrial fibrillation and OSA on CPAP, who presented to [facility] ED on 12/05/22 from [facility] due to altered mental status with lethargy and decreased responsiveness.  There was a concern about aspiration pneumonia at his facility as the patient had an episode of nausea and vomiting and was reportedly coughing per her sister without wheezing or dyspnea or hypoxia.  The patient's temperature was 102.2 when EMS arrived to this facility.  The patient was not responding to questions throughout transport.  EMS noted the patient having slurred speech and significant weakness per family (family had not seen her since last week).  Code stroke was called upon arrival to the ED. teleneurology evaluated the patient, was not considered a candidate for thrombolytics given unclear last known well time, along with low suspicion for stroke given significantly global symptoms and multiple metabolic derangements and sepsis contributing to encephalopathy.  ED Course: Initial Vital Signs: Temperature 101.4 F orally, pulse 78, respiratory rate 19, blood pressure 125/70, SpO2 99% on room air Significant Labs: hyponatremia 128 and hypochloremia of 90 with blood glucose of 197, BUN of 67 and creatinine 4.53 with CO2 of 22 and anion gap 16. Alk phos was 157 and  albumin 3.3, AST 66 and ALT 87 with total obtained of 8.6. Lactic acid was 2.3 and later 2.9. CBC showed leukocytosis of 21.4 with neutrophilia and anemia. Respiratory panel including influenza A, B, RSV and COVID-19 PCR came back negative. UA was positive for UTI alcohol level was less than 10. Urine drug screen was positive for tricyclic's.  Imaging Chest X-ray>>IMPRESSION: Mild vascular congestion likely related to volume overload. CT Head w/o contrast>>IMPRESSION: 1. No acute intracranial abnormality. 2. ASPECTS is 10. Medications Administered: IV Rocephin and Zithromax as well as 3 L bolus of IV normal saline   Hospitalist were asked to admit for further workup and treatment.  Nephrology consulted for hemodialysis.  Please see "significant hospital events" section below for full detailed hospital course.  Pertinent  Medical History   Past Medical History:  Diagnosis Date   AKI (acute kidney injury) (HCC)    a. 04/2021 in setting of bacteremia/shock.   Arthritis    Asthma    Bacteremia    a. 04/2021 S pyogenes bacteremia in setting of lower ext cellulitis.   COPD (chronic obstructive pulmonary disease) (HCC)    Diabetes mellitus without complication (HCC)    Endometriosis    GERD (gastroesophageal reflux disease)    History of echocardiogram    a. 07/2013 Echo: EF 55-60%, impaired relaxation, mild TR; b. 04/2021 Echo: EF 50-55%, mild LVH, nl RV fxn, mild BAE, Ao sclerosis w/o stenosis.   Hypertension    Obesity    PAF (paroxysmal atrial fibrillation) (HCC)    a. 04/2021 in setting of septic shock/cellulitis.   Sleep apnea    CPAP    Micro Data:  8/23: SARS-CoV-2/RSV/Flu PCR>>negative 8/23: Blood cultures>> 4/4 bottles with MRSA & Staph epidermidis 8/23: MRSA PCR>> positive 8/23: Urine>> 8/24: Permcath tip>>  Antimicrobials:   Anti-infectives (From admission, onward)    Start     Dose/Rate Route Frequency Ordered Stop   12/07/22 1400  vancomycin (VANCOCIN) IVPB 1000  mg/200 mL premix        1,000 mg 200 mL/hr over 60 Minutes Intravenous Every 24 hours 12/06/22 1234     12/06/22 2200  cefTRIAXone (ROCEPHIN) 2 g in sodium chloride 0.9 % 100 mL IVPB  Status:  Discontinued        2 g 200 mL/hr over 30 Minutes Intravenous Every 24 hours 12/06/22 0132 12/06/22 1223   12/06/22 1500  ceFEPIme (MAXIPIME) 2 g in sodium chloride 0.9 % 100 mL IVPB        2 g 200 mL/hr over 30 Minutes Intravenous Every 12 hours 12/06/22 1252     12/06/22 1315  vancomycin (VANCOREADY) IVPB 1750 mg/350 mL        1,750 mg 175 mL/hr over 120 Minutes Intravenous  Once 12/06/22 1224 12/06/22 1724   12/05/22 2130  cefTRIAXone (ROCEPHIN) 2 g in sodium chloride 0.9 % 100 mL IVPB  Status:  Discontinued        2 g 200 mL/hr over 30 Minutes Intravenous Every 24 hours 12/05/22 2115 12/06/22 0202   12/05/22 2130  azithromycin (ZITHROMAX) 500 mg in sodium chloride 0.9 % 250 mL IVPB  Status:  Discontinued        500 mg 250 mL/hr over 60 Minutes Intravenous Every 24 hours 12/05/22 2115 12/06/22 1223        Significant Hospital Events: Including procedures, antibiotic start and stop dates in addition to other pertinent events   8/23: Presented from [facility] due to AMS, Code Stroke called due to slurred speech and weakness. Deemed not a candidate for thrombolytics due to no clear last known normal time.  Admitted by [facility] with Nephrology consultation for dialysis. 8/24: Rapid response called due to Hypotension prior to/during Hemodialysis.  Transfer to ICU for peripheral vasopressors and initiation of CRRT.  PCCM consulted to assist with vasopressors.  Blood cultures + for MRSA & Staph Epi. ID consulted. Vascular Surgery consulted, permcath removed.  Temporary HD catheter placed. 8/24: this morning with pronounce left facial droop and dysarthria, remains on CRRT on Levo @8  keep MAP>55 12/08/22- patient with multispecies sepsis, removing central line today due to bacteremia.  Holding HD as per  nephrology team due to shock.   Interim History / Subjective:  -See significant events  Objective   Blood pressure (!) 108/54, pulse 77, temperature 98.4 F (36.9 C), temperature source Oral, resp. rate (!) 24, height 5\' 4"  (1.626 m), weight 90.3 kg, SpO2 100%.        Intake/Output Summary (Last 24 hours) at 12/08/2022 0827 Last data filed at 12/08/2022 0546 Gross per 24 hour  Intake 880.1 ml  Output 467 ml  Net 413.1 ml   Filed Weights   12/06/22 1215 12/07/22 0500 12/08/22 0233  Weight: 84.4 kg 87.1 kg 90.3 kg    Examination: General: Acute on chronically ill-appearing obese female, laying in bed, on room air, no acute distress HENT: Atraumatic, normocephalic, neck supple, difficult to assess JVD due to body habitus Lungs: Diminished breath sounds throughout, even, nonlabored, normal effort Cardiovascular: Regular rate and rhythm, S1-S2, no murmurs, rubs, gallops Abdomen: Obese, soft, nontender, nondistended, no guarding or rebound tenderness, bowel sounds positive x 4 Extremities:  Prior left AKA, 1+ edema to right lower extremity, no cyanosis Neuro: Lethargic, arouses easily to voice, oriented to person and place, moves extremities to command (left side slightly weaker, mild to moderate dysarthria, left facial droop GU: Incontinent of urine  Resolved Hospital Problem list     Assessment & Plan:   #Septic Shock- present on admission due to blood stream infection with MRSA bacteremia -Severe Sepsis due to MRSA & Staph Epi Bacteremia (suspect due to Permcath) & UTI -Monitor fever curve -Trend WBC's & Procalcitonin -Follow cultures as above -continue empiric Cefepime & Vancomycin pending cultures & sensitivities -Obtain Echocardiogram -Right chest Permcath removed  -Consult ID, appreciate input   Atrial fibrillation  Echocardiogram 05/16/21: LVEF 60-65%, mild LVH, Grade I DD, normal RV systolic function, mildly elevated pulmonary artery systolic pressure -Continuous  cardiac monitoring -Maintain MAP >65 -Cautious IV fluids due to ESRD -Vasopressors as needed to maintain MAP goal -Continue Midodrine -Trend lactic acid until normalized -Trend HS Troponin until peaked -Cortisol is 35.9 -Echocardiogram pending -Continue outpatient Amiodarone    #ESRD on Hemodialysis #Mild Hyponatremia -Monitor I&O's / urinary output -Follow BMP -Ensure adequate renal perfusion -Avoid nephrotoxic agents as able -Replace electrolytes as indicated ~ Pharmacy following for assistance with electrolyte replacement -Nephrology following, appreciate input: HD vs CRRT per recommendations  #COPD without acute exacerbation #OSA, uses CPAP at home -Supplemental O2 as needed to maintain O2 sats 88 to 92% -CPAP qhs -Follow intermittent Chest X-ray & ABG as needed -Bronchodilators prn -Pulmonary toilet as able  #Anemia of chronic disease -Monitor for S/Sx of bleeding -Trend CBC -Heparin SQ for VTE Prophylaxis  -Transfuse for Hgb <7  #Type 2 diabetes mellitus -CBG's q4h; Target range of 140 to 180 -SSI -Follow ICU Hypo/Hyperglycemia protocol -Hold outpatient metformin  #Acute Metabolic Encephalopathy in setting of sepsis and multiple metabolic derangements #?CVA PMHx: Depression and Anxiety CT Head 8/23 negative for acute intracranial abnormality Code stroke called 8/23, deemed not a candidate for thrombolytics given unclear last known well time -Treatment of Sepsis and metabolic derangements as outlined above -Provide supportive care -Promote normal sleep/wake cycle and family presence -Avoid sedating medications as able -Hold Klonopin, Buspar, Lexapro for now  Left sided weakness, Left Facial droop  -CTA head and neck reviewed and shows no acute intracranial abnormality or LVO -Check HgbA1c, fasting lipid panel -Obtain MRI  of the brain without contrast -PT consult, OT consult, Speech consult -Echocardiogram -Neurology consult placed    Best Practice  (right click and "Reselect all SmartList Selections" daily)   Diet/type: NPO DVT prophylaxis: prophylactic heparin  GI prophylaxis: PPI Lines: right radial A-line, left femoral Trialysis (both still needed) Foley:  N/A Code Status:  full code Last date of multidisciplinary goals of care discussion [N/A]  Labs   CBC: Recent Labs  Lab 12/05/22 2056 12/06/22 0404 12/06/22 0734 12/06/22 1934 12/07/22 0213 12/08/22 0525 12/08/22 0613  WBC 21.4* 16.0* 16.1*  --  22.5* 34.6* 34.5*  NEUTROABS 19.4*  --   --   --   --   --   --   HGB 10.2* 8.6* 8.6* 8.2* 8.8* 6.7* 6.7*  HCT 33.4* 28.4* 28.1* 26.6* 28.3* 21.4* 21.7*  MCV 84.3 85.5 85.4  --  83.0 82.6 83.8  PLT 411* 352 377  --  293 242 256    Basic Metabolic Panel: Recent Labs  Lab 12/06/22 1614 12/07/22 0213 12/07/22 1608 12/07/22 1843 12/08/22 0525 12/08/22 0613  NA 132* 132* 132*  --  130* 132*  K  3.5 3.2* 3.9  --  3.6 3.7  CL 100 103 101  --  100 102  CO2 20* 20* 21*  --  21* 20*  GLUCOSE 129* 149* 211*  --  178* 184*  BUN 54* 32* 26*  --  41* 38*  CREATININE 3.26* 1.97* 1.82*  --  2.38* 2.57*  CALCIUM 8.0* 8.7* 7.7*  --  8.2* 8.2*  MG  --  1.9  --  2.2 2.3 2.2  PHOS 2.9 2.5 1.8* 1.8* 1.7*  1.6* 1.7*   GFR: Estimated Creatinine Clearance: 20.9 mL/min (A) (by C-G formula based on SCr of 2.57 mg/dL (H)). Recent Labs  Lab 12/06/22 0016 12/06/22 0404 12/06/22 0734 12/06/22 0735 12/06/22 1257 12/07/22 0213 12/08/22 0525 12/08/22 0613  PROCALCITON  --  2.01  --   --   --  1.51 4.43  --   WBC  --  16.0* 16.1*  --   --  22.5* 34.6* 34.5*  LATICACIDVEN 2.9* 2.0*  --  1.9 1.7  --   --   --     Liver Function Tests: Recent Labs  Lab 12/05/22 2056 12/06/22 0354 12/06/22 1614 12/07/22 0213 12/07/22 1608 12/08/22 0525  AST 66*  --   --   --   --   --   ALT 87*  --   --   --   --   --   ALKPHOS 157*  --   --   --   --   --   BILITOT 0.6  --   --   --   --   --   PROT 8.6*  --   --   --   --   --   ALBUMIN  3.3* 2.5* 2.3* 1.9* 1.8* 2.0*   No results for input(s): "LIPASE", "AMYLASE" in the last 168 hours. No results for input(s): "AMMONIA" in the last 168 hours.  ABG    Component Value Date/Time   PHART 7.40 05/14/2021 0341   PCO2ART 45 05/14/2021 0341   PO2ART 129 (H) 05/14/2021 0341   HCO3 27.9 05/14/2021 0341   ACIDBASEDEF 12.8 (H) 05/11/2021 0603   O2SAT 98.9 05/14/2021 0341     Coagulation Profile: Recent Labs  Lab 12/05/22 2056 12/06/22 0404  INR 1.1 1.2    Cardiac Enzymes: No results for input(s): "CKTOTAL", "CKMB", "CKMBINDEX", "TROPONINI" in the last 168 hours.  HbA1C: Hgb A1c MFr Bld  Date/Time Value Ref Range Status  12/07/2022 02:13 AM 5.2 4.8 - 5.6 % Final    Comment:    (NOTE) Pre diabetes:          5.7%-6.4%  Diabetes:              >6.4%  Glycemic control for   <7.0% adults with diabetes   05/05/2021 04:53 AM 6.7 (H) 4.8 - 5.6 % Final    Comment:    (NOTE)         Prediabetes: 5.7 - 6.4         Diabetes: >6.4         Glycemic control for adults with diabetes: <7.0     CBG: Recent Labs  Lab 12/07/22 1622 12/07/22 2139 12/07/22 2345 12/08/22 0536 12/08/22 0813  GLUCAP 195* 157* 164* 170* 163*    Review of Systems:   Unable to assess due to AMS  Past Medical History:  She,  has a past medical history of AKI (acute kidney injury) (HCC), Arthritis, Asthma, Bacteremia, COPD (chronic obstructive pulmonary disease) (  HCC), Diabetes mellitus without complication (HCC), Endometriosis, GERD (gastroesophageal reflux disease), History of echocardiogram, Hypertension, Obesity, PAF (paroxysmal atrial fibrillation) (HCC), and Sleep apnea.   Surgical History:   Past Surgical History:  Procedure Laterality Date   ABDOMINAL HYSTERECTOMY     APPLICATION OF WOUND VAC Left 05/17/2021   Procedure: APPLICATION OF WOUND VAC/WOUND VAC EXCHANGE-Matrix Myriad;  Surgeon: Carolan Shiver, MD;  Location: [facility];  Service: General;  Laterality: Left;    APPLICATION OF WOUND VAC  05/24/2021   Procedure: APPLICATION OF WOUND VAC;  Surgeon: Carolan Shiver, MD;  Location: [facility];  Service: General;;   APPLICATION OF WOUND VAC  05/10/2021   Procedure: APPLICATION OF WOUND VAC;  Surgeon: Carolan Shiver, MD;  Location: [facility];  Service: General;;   COLONOSCOPY  10/29/2006   Dr Servando Snare   COLONOSCOPY WITH PROPOFOL N/A 11/05/2016   Procedure: COLONOSCOPY WITH PROPOFOL;  Surgeon: Earline Mayotte, MD;  Location: [facility];  Service: Endoscopy;  Laterality: N/A;   INCISION AND DRAINAGE OF WOUND Left 05/24/2021   Procedure: IRRIGATION AND DEBRIDEMENT LEFT LEG;  Surgeon: Carolan Shiver, MD;  Location: [facility];  Service: General;  Laterality: Left;   INCISION AND DRAINAGE OF WOUND Left 05/10/2021   Procedure: IRRIGATION AND DEBRIDEMENT LEFT LEG;  Surgeon: Carolan Shiver, MD;  Location: [facility];  Service: General;  Laterality: Left;   IR FLUORO GUIDE CV LINE RIGHT  06/12/2021   IR PERC TUN PERIT CATH WO PORT S&I /IMAG  06/12/2021   IR REPLC GASTRO/COLONIC TUBE PERCUT W/FLUORO  06/12/2021   IVC FILTER INSERTION N/A 05/29/2021   Procedure: IVC FILTER INSERTION;  Surgeon: Renford Dills, MD;  Location: [facility];  Service: Cardiovascular;  Laterality: N/A;   MINOR GRAFT APPLICATION  05/24/2021   Procedure: Myriad Matrix  APPLICATION;  Surgeon: Carolan Shiver, MD;  Location: [facility];  Service: General;;   NASAL SINUS SURGERY  2002   Dr Chestine Spore   PEG PLACEMENT N/A 05/28/2021   Procedure: PERCUTANEOUS ENDOSCOPIC GASTROSTOMY (PEG) PLACEMENT;  Surgeon: Sung Amabile, DO;  Location: [facility];  Service: General;  Laterality: N/A;  TRAVEL CASE   TRACHEOSTOMY TUBE PLACEMENT N/A 05/17/2021   Procedure: TRACHEOSTOMY;  Surgeon: Geanie Logan, MD;  Location: [facility];  Service: ENT;  Laterality: N/A;   WOUND DEBRIDEMENT Left 05/07/2021   Procedure: DEBRIDEMENT WOUND;  Surgeon: Carolan Shiver, MD;  Location: [facility];   Service: General;  Laterality: Left;     Social History:   reports that she quit smoking about 21 years ago. Her smoking use included cigarettes. She started smoking about 61 years ago. She has a 80 pack-year smoking history. She quit smokeless tobacco use about 21 years ago.  Her smokeless tobacco use included snuff. She reports that she does not drink alcohol and does not use drugs.   Family History:  Her family history includes Alcohol abuse in her brother; Breast cancer (age of onset: 25) in her sister; Congestive Heart Failure in her mother; Coronary artery disease (age of onset: 8) in her father; Heart disease in her brother; Varicose Veins in her brother.   Allergies No Known Allergies   Home Medications  Prior to Admission medications   Medication Sig Start Date End Date Taking? Authorizing Provider  amiodarone (PACERONE) 200 MG tablet Place 1 tablet (200 mg total) into feeding tube 2 (two) times daily. 06/13/21  Yes Erin Fulling, MD  ascorbic acid (VITAMIN C) 500 MG tablet Place 1 tablet (500 mg total) into feeding  tube 2 (two) times daily. 06/13/21  Yes Kasa, Wallis Bamberg, MD  busPIRone (BUSPAR) 5 MG tablet Take 5 mg by mouth 2 (two) times daily. 12/04/22  Yes [provider]  cholecalciferol (VITAMIN D) 25 MCG tablet Place 2 tablets (2,000 Units total) into feeding tube daily. 06/14/21  Yes Kasa, Wallis Bamberg, MD  clonazePAM (KLONOPIN) 0.5 MG tablet Take 0.25-0.5 mg by mouth 2 (two) times daily. 0.25 mg every morning and 0.5 mg at bedtime for Agitation 11/18/22  Yes [provider]  escitalopram (LEXAPRO) 10 MG tablet Place 1 tablet (10 mg total) into feeding tube daily. 06/14/21  Yes Erin Fulling, MD  hydrOXYzine (ATARAX) 50 MG tablet Take 100 mg by mouth 2 (two) times daily. 12/02/22  Yes [provider]  lip balm (BLISTEX) OINT Apply 1 application topically 3 (three) times daily. 06/13/21  Yes Erin Fulling, MD  loperamide (IMODIUM) 2 MG capsule Take 1 capsule (2 mg total) by  mouth as needed for diarrhea or loose stools. 06/13/21  Yes Erin Fulling, MD  midodrine (PROAMATINE) 5 MG tablet Place 3 tablets (15 mg total) into feeding tube 3 (three) times daily with meals. 06/13/21  Yes Erin Fulling, MD  multivitamin (RENA-VIT) TABS tablet Place 1 tablet into feeding tube at bedtime. 06/13/21  Yes Erin Fulling, MD  omeprazole (PRILOSEC) 20 MG capsule Take 20 mg by mouth daily. 11/12/22  Yes [provider]  sennosides (SENOKOT) 8.8 MG/5ML syrup Place 10 mLs into feeding tube at bedtime as needed for moderate constipation. 06/13/21  Yes Erin Fulling, MD  artificial tears (LACRILUBE) OINT ophthalmic ointment Place into both eyes every 4 (four) hours as needed for dry eyes. Patient not taking: Reported on 12/06/2022 06/13/21   Erin Fulling, MD  chlorhexidine gluconate, MEDLINE KIT, (PERIDEX) 0.12 % solution 15 mLs by Mouth Rinse route 2 (two) times daily. Patient not taking: Reported on 12/06/2022 06/13/21   Erin Fulling, MD  collagenase (SANTYL) ointment Apply topically daily. Patient not taking: Reported on 12/06/2022 06/14/21   Erin Fulling, MD  dextrose 5 % SOLN 100 mL with copper chloride 0.4 MG/ML SOLN 2 mg Inject 2 mg into the vein daily. 06/14/21   Erin Fulling, MD  ipratropium-albuterol (DUONEB) 0.5-2.5 (3) MG/3ML SOLN Take 3 mLs by nebulization 3 (three) times daily. Patient not taking: Reported on 12/06/2022 06/13/21   Erin Fulling, MD  lactobacillus (FLORANEX/LACTINEX) PACK Place 1 packet (1 g total) into feeding tube 3 (three) times daily with meals. Patient not taking: Reported on 12/06/2022 06/13/21   Erin Fulling, MD  pantoprazole sodium (PROTONIX) 40 mg Place 40 mg into feeding tube daily. Patient not taking: Reported on 12/06/2022 06/13/21   Erin Fulling, MD  Scheduled Meds:  sodium chloride   Intravenous Once   amiodarone  200 mg Per Tube BID   ascorbic acid  500 mg Per Tube BID   atorvastatin  40 mg Per Tube Daily   chlorhexidine  15 mL Mouth Rinse BID   Chlorhexidine  Gluconate Cloth  6 each Topical Q0600   vitamin D3  2,000 Units Per Tube Daily   droxidopa  300 mg Oral TID WC   escitalopram  10 mg Per Tube Daily   famotidine  40 mg Per Tube Daily   feeding supplement (PROSource TF20)  60 mL Per Tube Daily   heparin injection (subcutaneous)  5,000 Units Subcutaneous Q8H   hydrocortisone sod succinate (SOLU-CORTEF) inj  100 mg Intravenous Q6H   hydrOXYzine  50 mg Per Tube Q6H  lactobacillus  1 g Per Tube TID WC   midodrine  15 mg Per Tube TID WC   multivitamin  1 tablet Per Tube QHS   mupirocin ointment   Nasal BID   Continuous Infusions:  sodium chloride Stopped (12/07/22 0210)   ceFEPime (MAXIPIME) IV Stopped (12/08/22 0308)   feeding supplement (VITAL 1.5 CAL) Stopped (12/07/22 2322)   heparin 10,000 units/ 20 mL infusion syringe 1,000 Units/hr (12/07/22 1118)   norepinephrine (LEVOPHED) Adult infusion 15 mcg/min (12/08/22 0546)   prismasol BGK 4/2.5 Stopped (12/07/22 1700)   sodium phosphate 15 mmol in dextrose 5 % 250 mL infusion     vancomycin Stopped (12/07/22 1541)   PRN Meds:.sodium chloride, acetaminophen **OR** acetaminophen, artificial tears, ipratropium-albuterol, loperamide HCl, magnesium hydroxide, ondansetron **OR** ondansetron (ZOFRAN) IV, mouth rinse, sennosides, sodium chloride, traZODone   Critical care provider statement:   Total critical care time: 33 minutes   Performed by: Karna Christmas MD   Critical care time was exclusive of separately billable procedures and treating other patients.   Critical care was necessary to treat or prevent imminent or life-threatening deterioration.   Critical care was time spent personally by me on the following activities: development of treatment plan with patient and/or surrogate as well as nursing, discussions with consultants, evaluation of patient's response to treatment, examination of patient, obtaining history from patient or surrogate, ordering and performing treatments and  interventions, ordering and review of laboratory studies, ordering and review of radiographic studies, pulse oximetry and re-evaluation of patient's condition.    Vida Rigger, M.D.  Pulmonary & Critical Care Medicine

## 2022-12-08 NOTE — Progress Notes (Signed)
[facility]  ROUNDING NOTE   Subjective:   Alexandra Foster is a 72 y.o. female with past medical history of diabetes, COPD, GERD, hypertension. OA, a fib, and end stage renal disease on hemodialysis. Patient presents to the ED from her nursing facility complaining of altered mental status. She has been admitted for Lower urinary tract infectious disease [N39.0] Altered mental status, unspecified altered mental status type [R41.82] Sepsis due to gram-negative UTI (HCC) [A41.50, N39.0] Sepsis, due to unspecified organism, unspecified whether acute organ dysfunction present (HCC) [A41.9]  Permcath tip culture with MRSA.   CRRT held  Remains on norepinephrine.   Family at bedside.   Patient more awake this morning.   Objective:  Vital signs in last 24 hours:  Temp:  [97.7 F (36.5 C)-98.6 F (37 C)] 97.7 F (36.5 C) (08/26 0800) Pulse Rate:  [50-97] 69 (08/26 1045) Resp:  [14-31] 18 (08/26 1045) BP: (80-128)/(44-95) 86/44 (08/26 1030) SpO2:  [97 %-100 %] 100 % (08/26 1045) Arterial Line BP: (57-124)/(37-101) 111/63 (08/26 0700) Weight:  [90.3 kg] 90.3 kg (08/26 0233)  Weight change: 4.3 kg Filed Weights   12/06/22 1215 12/07/22 0500 12/08/22 0233  Weight: 84.4 kg 87.1 kg 90.3 kg    Intake/Output: I/O last 3 completed shifts: In: 1492.9 [I.V.:807.9; Other:185; IV Piggyback:500.1] Out: 940.7 [Urine:430]   Intake/Output this shift:  Total I/O In: 83.8 [I.V.:33.8; Other:50] Out: -   Physical Exam: General: Lethargic  Head: Normocephalic, atraumatic. Moist oral mucosal membranes  Eyes: Anicteric  Lungs:  Diminished bilaterally   Heart: Irregular   Abdomen:  Soft, nontender  Extremities:  1+ peripheral edema.  Neurologic: Alert to self and place, not to situation or time   Skin: No lesions  Access: Left femoral temp HD catheter.     Basic Metabolic Panel: Recent Labs  Lab 12/06/22 1614 12/07/22 0213 12/07/22 1608 12/07/22 1843 12/08/22 0525  12/08/22 0613  NA 132* 132* 132*  --  130* 132*  K 3.5 3.2* 3.9  --  3.6 3.7  CL 100 103 101  --  100 102  CO2 20* 20* 21*  --  21* 20*  GLUCOSE 129* 149* 211*  --  178* 184*  BUN 54* 32* 26*  --  41* 38*  CREATININE 3.26* 1.97* 1.82*  --  2.38* 2.57*  CALCIUM 8.0* 8.7* 7.7*  --  8.2* 8.2*  MG  --  1.9  --  2.2 2.3 2.2  PHOS 2.9 2.5 1.8* 1.8* 1.7*  1.6* 1.7*    Liver Function Tests: Recent Labs  Lab 12/05/22 2056 12/06/22 0354 12/06/22 1614 12/07/22 0213 12/07/22 1608 12/08/22 0525  AST 66*  --   --   --   --   --   ALT 87*  --   --   --   --   --   ALKPHOS 157*  --   --   --   --   --   BILITOT 0.6  --   --   --   --   --   PROT 8.6*  --   --   --   --   --   ALBUMIN 3.3* 2.5* 2.3* 1.9* 1.8* 2.0*   No results for input(s): "LIPASE", "AMYLASE" in the last 168 hours. No results for input(s): "AMMONIA" in the last 168 hours.  CBC: Recent Labs  Lab 12/05/22 2056 12/06/22 0404 12/06/22 0734 12/06/22 1934 12/07/22 0213 12/08/22 0525 12/08/22 0613  WBC 21.4* 16.0* 16.1*  --  22.5* 34.6* 34.5*  NEUTROABS 19.4*  --   --   --   --   --   --   HGB 10.2* 8.6* 8.6* 8.2* 8.8* 6.7* 6.7*  HCT 33.4* 28.4* 28.1* 26.6* 28.3* 21.4* 21.7*  MCV 84.3 85.5 85.4  --  83.0 82.6 83.8  PLT 411* 352 377  --  293 242 256    Cardiac Enzymes: No results for input(s): "CKTOTAL", "CKMB", "CKMBINDEX", "TROPONINI" in the last 168 hours.  BNP: Invalid input(s): "POCBNP"  CBG: Recent Labs  Lab 12/07/22 1622 12/07/22 2139 12/07/22 2345 12/08/22 0536 12/08/22 0813  GLUCAP 195* 157* 164* 170* 163*    Microbiology: Results for orders placed or performed during the hospital encounter of 12/05/22  Culture, blood (Routine x 2)     Status: Abnormal   Collection Time: 12/05/22  8:56 PM   Specimen: BLOOD  Result Value Ref Range Status   Specimen Description   Final    BLOOD BLOOD RIGHT ARM Performed at [facility], [address], [place], [place] [address]    Special  Requests   Final    BOTTLES DRAWN AEROBIC AND ANAEROBIC Blood Culture adequate volume Performed at [facility], [address] [place], [place] [address]    Culture  Setup Time   Final    GRAM POSITIVE COCCI IN BOTH AEROBIC AND ANAEROBIC BOTTLES CRITICAL RESULT CALLED TO, READ BACK BY AND VERIFIED WITH: SHEEMA HALLLAJI AT 1206 12/06/22.PMF Performed at [facility], [address] [place], [place] [address]    Culture (A)  Final    STAPHYLOCOCCUS AUREUS SUSCEPTIBILITIES PERFORMED ON PREVIOUS CULTURE WITHIN THE LAST 5 DAYS. Performed at [facility], [address], [place], [place] [address]    Report Status 12/08/2022 FINAL  Final  Culture, blood (Routine x 2)     Status: Abnormal   Collection Time: 12/05/22  8:57 PM   Specimen: BLOOD  Result Value Ref Range Status   Specimen Description   Final    BLOOD BLOOD LEFT ARM Performed at [facility], [address], [place], [place] [address]    Special Requests   Final    BOTTLES DRAWN AEROBIC AND ANAEROBIC Blood Culture adequate volume Performed at [facility], [address] [place], [place] [address]    Culture  Setup Time   Final    GRAM POSITIVE COCCI IN BOTH AEROBIC AND ANAEROBIC BOTTLES CRITICAL RESULT CALLED TO, READ BACK BY AND VERIFIED WITH: SHEEMA HALLAJI AT 1206 12/06/22.PMF Performed at [facility], [address], [place], [place] [address]    Culture METHICILLIN RESISTANT STAPHYLOCOCCUS AUREUS (A)  Final   Report Status 12/08/2022 FINAL  Final   Organism ID, Bacteria METHICILLIN RESISTANT STAPHYLOCOCCUS AUREUS  Final      Susceptibility   Methicillin resistant staphylococcus aureus - MIC*    CIPROFLOXACIN >=8 RESISTANT Resistant     ERYTHROMYCIN >=8 RESISTANT Resistant     GENTAMICIN <=0.5 SENSITIVE Sensitive     OXACILLIN >=4 RESISTANT Resistant     TETRACYCLINE <=1 SENSITIVE Sensitive     VANCOMYCIN <=0.5 SENSITIVE Sensitive     TRIMETH/SULFA >=320  RESISTANT Resistant     CLINDAMYCIN <=0.25 SENSITIVE Sensitive     RIFAMPIN <=0.5 SENSITIVE Sensitive     Inducible Clindamycin NEGATIVE Sensitive     LINEZOLID 2 SENSITIVE Sensitive     * METHICILLIN RESISTANT STAPHYLOCOCCUS AUREUS  Blood Culture ID Panel (Reflexed)     Status: Abnormal  Collection Time: 12/05/22  8:57 PM  Result Value Ref Range Status   Enterococcus faecalis NOT DETECTED NOT DETECTED Final   Enterococcus Faecium NOT DETECTED NOT DETECTED Final   Listeria monocytogenes NOT DETECTED NOT DETECTED Final   Staphylococcus species DETECTED (A) NOT DETECTED Final    Comment: CRITICAL RESULT CALLED TO, READ BACK BY AND VERIFIED WITH: SHEEMA HALLAJI AT 1206 12/06/22.PMF    Staphylococcus aureus (BCID) DETECTED (A) NOT DETECTED Final    Comment: Methicillin (oxacillin)-resistant Staphylococcus aureus (MRSA). MRSA is predictably resistant to beta-lactam antibiotics (except ceftaroline). Preferred therapy is vancomycin unless clinically contraindicated. Patient requires contact precautions if  hospitalized. CRITICAL RESULT CALLED TO, READ BACK BY AND VERIFIED WITH: SHEEMA HALLAJI AT 1206 12/06/22.PMF    Staphylococcus epidermidis DETECTED (A) NOT DETECTED Final    Comment: CRITICAL RESULT CALLED TO, READ BACK BY AND VERIFIED WITH: SHEEMA HALLAJI AT 1206 12/06/22.PMF    Staphylococcus lugdunensis NOT DETECTED NOT DETECTED Final   Streptococcus species NOT DETECTED NOT DETECTED Final   Streptococcus agalactiae NOT DETECTED NOT DETECTED Final   Streptococcus pneumoniae NOT DETECTED NOT DETECTED Final   Streptococcus pyogenes NOT DETECTED NOT DETECTED Final   A.calcoaceticus-baumannii NOT DETECTED NOT DETECTED Final   Bacteroides fragilis NOT DETECTED NOT DETECTED Final   Enterobacterales NOT DETECTED NOT DETECTED Final   Enterobacter cloacae complex NOT DETECTED NOT DETECTED Final   Escherichia coli NOT DETECTED NOT DETECTED Final   Klebsiella aerogenes NOT DETECTED NOT DETECTED  Final   Klebsiella oxytoca NOT DETECTED NOT DETECTED Final   Klebsiella pneumoniae NOT DETECTED NOT DETECTED Final   Proteus species NOT DETECTED NOT DETECTED Final   Salmonella species NOT DETECTED NOT DETECTED Final   Serratia marcescens NOT DETECTED NOT DETECTED Final   Haemophilus influenzae NOT DETECTED NOT DETECTED Final   Neisseria meningitidis NOT DETECTED NOT DETECTED Final   Pseudomonas aeruginosa NOT DETECTED NOT DETECTED Final   Stenotrophomonas maltophilia NOT DETECTED NOT DETECTED Final   Candida albicans NOT DETECTED NOT DETECTED Final   Candida auris NOT DETECTED NOT DETECTED Final   Candida glabrata NOT DETECTED NOT DETECTED Final   Candida krusei NOT DETECTED NOT DETECTED Final   Candida parapsilosis NOT DETECTED NOT DETECTED Final   Candida tropicalis NOT DETECTED NOT DETECTED Final   Cryptococcus neoformans/gattii NOT DETECTED NOT DETECTED Final   Methicillin resistance mecA/C DETECTED (A) NOT DETECTED Final    Comment: CRITICAL RESULT CALLED TO, READ BACK BY AND VERIFIED WITH: SHEEMA HALLAJI AT 1206 12/06/22.PMF    Meth resistant mecA/C and MREJ DETECTED (A) NOT DETECTED Final    Comment: CRITICAL RESULT CALLED TO, READ BACK BY AND VERIFIED WITH: SHEEMA HALLAJI AT 1206 12/06/22.PMF Performed at [facility], [address] [place], [place] [address]   Resp panel by RT-PCR (RSV, Flu A&B, Covid) Anterior Nasal Swab     Status: None   Collection Time: 12/05/22  9:53 PM   Specimen: Anterior Nasal Swab  Result Value Ref Range Status   SARS Coronavirus 2 by RT PCR NEGATIVE NEGATIVE Final    Comment: (NOTE) SARS-CoV-2 target nucleic acids are NOT DETECTED.  The SARS-CoV-2 RNA is generally detectable in upper respiratory specimens during the acute phase of infection. The lowest concentration of SARS-CoV-2 viral copies this assay can detect is 138 copies/mL. A negative result does not preclude SARS-Cov-2 infection and should not be used as the sole basis for  treatment or other patient management decisions. A negative result may occur with  improper specimen collection/handling, submission  of specimen other than nasopharyngeal swab, presence of viral mutation(s) within the areas targeted by this assay, and inadequate number of viral copies(<138 copies/mL). A negative result must be combined with clinical observations, patient history, and epidemiological information. The expected result is Negative.  Fact Sheet for Patients:  BloggerCourse.com  Fact Sheet for Healthcare Providers:  SeriousBroker.it  This test is no t yet approved or cleared by the [place] FDA and  has been authorized for detection and/or diagnosis of SARS-CoV-2 by FDA under an Emergency Use Authorization (EUA). This EUA will remain  in effect (meaning this test can be used) for the duration of the COVID-19 declaration under Section 564(b)(1) of the Act, 21 U.S.C.section 360bbb-3(b)(1), unless the authorization is terminated  or revoked sooner.       Influenza A by PCR NEGATIVE NEGATIVE Final   Influenza B by PCR NEGATIVE NEGATIVE Final    Comment: (NOTE) The Xpert Xpress SARS-CoV-2/FLU/RSV plus assay is intended as an aid in the diagnosis of influenza from Nasopharyngeal swab specimens and should not be used as a sole basis for treatment. Nasal washings and aspirates are unacceptable for Xpert Xpress SARS-CoV-2/FLU/RSV testing.  Fact Sheet for Patients: BloggerCourse.com  Fact Sheet for Healthcare Providers: SeriousBroker.it  This test is not yet approved or cleared by the [place] FDA and has been authorized for detection and/or diagnosis of SARS-CoV-2 by FDA under an Emergency Use Authorization (EUA). This EUA will remain in effect (meaning this test can be used) for the duration of the COVID-19 declaration under Section 564(b)(1) of the Act, 21  U.S.C. section 360bbb-3(b)(1), unless the authorization is terminated or revoked.     Resp Syncytial Virus by PCR NEGATIVE NEGATIVE Final    Comment: (NOTE) Fact Sheet for Patients: BloggerCourse.com  Fact Sheet for Healthcare Providers: SeriousBroker.it  This test is not yet approved or cleared by the [place] FDA and has been authorized for detection and/or diagnosis of SARS-CoV-2 by FDA under an Emergency Use Authorization (EUA). This EUA will remain in effect (meaning this test can be used) for the duration of the COVID-19 declaration under Section 564(b)(1) of the Act, 21 U.S.C. section 360bbb-3(b)(1), unless the authorization is terminated or revoked.  Performed at [facility], [address] [place] [address] [place], [place] [address]   MRSA Next Gen by PCR, Nasal     Status: Abnormal   Collection Time: 12/06/22  4:27 AM   Specimen: Nasal Mucosa; Nasal Swab  Result Value Ref Range Status   MRSA by PCR Next Gen DETECTED (A) NOT DETECTED Final    Comment: RESULT CALLED TO, READ BACK BY AND VERIFIED WITH: KRISTINE CHAMBERS AT 2841 12/06/22.PMF (NOTE) The GeneXpert MRSA Assay (FDA approved for NASAL specimens only), is one component of a comprehensive MRSA colonization surveillance program. It is not intended to diagnose MRSA infection nor to guide or monitor treatment for MRSA infections. Test performance is not FDA approved in patients less than 27 years old. Performed at [facility], [address], [place], [place] [address]   Cath Tip Culture     Status: Abnormal   Collection Time: 12/06/22  2:22 PM   Specimen: Catheter Tip; Other  Result Value Ref Range Status   Specimen Description   Final    CATH TIP Performed at [facility], [address], [place], [place] [address]    Special Requests   Final    NONE Performed at [facility], [address] [place], [place] [address]  Culture (A)  Final    >=100,000 COLONIES/mL METHICILLIN RESISTANT STAPHYLOCOCCUS AUREUS   Report Status 12/08/2022 FINAL  Final   Organism ID, Bacteria METHICILLIN RESISTANT STAPHYLOCOCCUS AUREUS (A)  Final      Susceptibility   Methicillin resistant staphylococcus aureus - MIC*    CIPROFLOXACIN >=8 RESISTANT Resistant     ERYTHROMYCIN >=8 RESISTANT Resistant     GENTAMICIN <=0.5 SENSITIVE Sensitive     OXACILLIN >=4 RESISTANT Resistant     TETRACYCLINE <=1 SENSITIVE Sensitive     VANCOMYCIN <=0.5 SENSITIVE Sensitive     TRIMETH/SULFA >=320 RESISTANT Resistant     CLINDAMYCIN <=0.25 SENSITIVE Sensitive     RIFAMPIN <=0.5 SENSITIVE Sensitive     Inducible Clindamycin NEGATIVE Sensitive     LINEZOLID 2 SENSITIVE Sensitive     * >=100,000 COLONIES/mL METHICILLIN RESISTANT STAPHYLOCOCCUS AUREUS  Urine Culture (for pregnant, neutropenic or urologic patients or patients with an indwelling urinary catheter)     Status: Abnormal (Preliminary result)   Collection Time: 12/06/22  5:41 PM   Specimen: Urine, Clean Catch  Result Value Ref Range Status   Specimen Description   Final    URINE, CLEAN CATCH Performed at [facility], [address], [place], [place] [address]    Special Requests   Final    Normal Performed at [facility], [address] [place], [place] [address]    Culture (A)  Final    70,000 COLONIES/mL ENTEROCOCCUS FAECALIS 70,000 COLONIES/mL YEAST SUSCEPTIBILITIES TO FOLLOW Performed at [facility], [address] [place], [place] [address]    Report Status PENDING  Incomplete  Culture, blood (Routine X 2) w Reflex to ID Panel     Status: None (Preliminary result)   Collection Time: 12/07/22  1:31 PM   Specimen: BLOOD  Result Value Ref Range Status   Specimen Description BLOOD BLOOD RIGHT HAND  Final   Special Requests   Final    BOTTLES DRAWN AEROBIC ONLY Blood Culture adequate volume   Culture   Final    NO GROWTH < 24 HOURS Performed  at [facility], [address], [place], [place] [address]    Report Status PENDING  Incomplete  Culture, blood (Routine X 2) w Reflex to ID Panel     Status: None (Preliminary result)   Collection Time: 12/07/22  6:41 PM   Specimen: BLOOD  Result Value Ref Range Status   Specimen Description BLOOD BLOOD RIGHT HAND  Final   Special Requests   Final    BOTTLES DRAWN AEROBIC AND ANAEROBIC Blood Culture results may not be optimal due to an inadequate volume of blood received in culture bottles   Culture   Final    NO GROWTH < 24 HOURS Performed at [facility], [address], [place], [place] [address]    Report Status PENDING  Incomplete    Coagulation Studies: Recent Labs    12/05/22 12-15-2054 12/06/22 0404  LABPROT 14.1 15.6*  INR 1.1 1.2    Urinalysis: Recent Labs    12/05/22 2350  COLORURINE YELLOW*  LABSPEC 1.021  PHURINE 5.0  GLUCOSEU NEGATIVE  HGBUR NEGATIVE  BILIRUBINUR SMALL*  KETONESUR NEGATIVE  PROTEINUR 100*  NITRITE NEGATIVE  LEUKOCYTESUR LARGE*      Imaging: ECHOCARDIOGRAM COMPLETE  Result Date: 12/07/2022    ECHOCARDIOGRAM REPORT   Patient Name:   [facility] Date of Exam: 12/07/2022 Medical Rec #:  [id number]  Height:       64.0 in Accession #:    [id number]          Weight:       192.0 lb Date of Birth:  11/19/1950           BSA:          1.923 m Patient Age:    72 years            BP:           99/69 mmHg Patient Gender: F                   HR:           92 bpm. Exam Location:  [facility] Procedure: 2D Echo and Intracardiac Opacification Agent Indications:     Bacteremia R78.81  History:         Patient has prior history of Echocardiogram examinations, most                  recent 05/16/2021.  Sonographer:     Overton Mam RDCS, FASE Referring Phys:  [id number] Judithe Modest Diagnosing Phys: Dietrich Pates MD  Sonographer Comments: Technically challenging study due to limited acoustic windows, patient is obese and no subcostal  window. Image acquisition challenging due to patient body habitus and Image acquisition challenging due to respiratory motion. IMPRESSIONS  1. Left ventricular ejection fraction, by estimation, is 60 to 65%. The left ventricle has normal function. The left ventricle has no regional wall motion abnormalities.  2. Right ventricular systolic function is low normal. The right ventricular size is mildly enlarged. Mildly increased right ventricular wall thickness.  3. Trivial mitral valve regurgitation.  4. Aortic valve regurgitation is not visualized. No aortic stenosis is present. Comparison(s): The left ventricular function is unchanged. FINDINGS  Left Ventricle: Left ventricular ejection fraction, by estimation, is 60 to 65%. The left ventricle has normal function. The left ventricle has no regional wall motion abnormalities. Definity contrast agent was given IV to delineate the left ventricular  endocardial borders. The left ventricular internal cavity size was normal in size. There is no left ventricular hypertrophy. Right Ventricle: The right ventricular size is mildly enlarged. Mildly increased right ventricular wall thickness. Right ventricular systolic function is low normal. Left Atrium: Left atrial size was normal in size. Right Atrium: Right atrial size was normal in size. Pericardium: There is no evidence of pericardial effusion. Mitral Valve: Mild mitral annular calcification. Trivial mitral valve regurgitation. Tricuspid Valve: The tricuspid valve is normal in structure. Tricuspid valve regurgitation is mild. Aortic Valve: Aortic valve regurgitation is not visualized. No aortic stenosis is present. Aortic valve peak gradient measures 9.2 mmHg. Pulmonic Valve: The pulmonic valve was not well visualized. Pulmonic valve regurgitation is mild. No evidence of pulmonic stenosis. Aorta: The aortic root is normal in size and structure. IAS/Shunts: The interatrial septum was not assessed. Additional Comments: A  device lead is visualized.  LEFT VENTRICLE PLAX 2D LVIDd:         4.15 cm   Diastology LVIDs:         2.75 cm   LV e' medial:    5.11 cm/s LV PW:         1.05 cm   LV E/e' medial:  12.1 LV IVS:        1.05 cm   LV e' lateral:   8.59 cm/s LVOT diam:     1.80 cm   LV E/e' lateral:  7.2 LV SV:         39 LV SV Index:   20 LVOT Area:     2.54 cm  LEFT ATRIUM           Index        RIGHT ATRIUM          Index LA diam:      3.20 cm 1.66 cm/m   RA Area:     5.68 cm LA Vol (A4C): 20.6 ml 10.71 ml/m  RA Volume:   8.95 ml  4.65 ml/m  AORTIC VALVE                 PULMONIC VALVE AV Area (Vmax): 1.74 cm     PV Vmax:       1.30 m/s AV Vmax:        152.00 cm/s  PV Peak grad:  6.8 mmHg AV Peak Grad:   9.2 mmHg LVOT Vmax:      104.00 cm/s LVOT Vmean:     66.900 cm/s LVOT VTI:       0.153 m  AORTA Ao Root diam: 3.40 cm MITRAL VALVE               TRICUSPID VALVE MV Area (PHT): 4.12 cm    TR Peak grad:   37.9 mmHg MV Decel Time: 184 msec    TR Vmax:        308.00 cm/s MV E velocity: 61.80 cm/s MV A velocity: 83.80 cm/s  SHUNTS MV E/A ratio:  0.74        Systemic VTI:  0.15 m                            Systemic Diam: 1.80 cm Dietrich Pates MD Electronically signed by Dietrich Pates MD Signature Date/Time: 12/07/2022/6:35:47 PM    Final    MR BRAIN WO CONTRAST  Result Date: 12/07/2022 CLINICAL DATA:  Provided history: Neuro deficit, acute, stroke suspected. Additional history provided: Altered mental status, lethargy, decreased responsiveness. EXAM: MRI HEAD WITHOUT CONTRAST TECHNIQUE: Multiplanar, multiecho pulse sequences of the brain and surrounding structures were obtained without intravenous contrast. COMPARISON:  Non-contrast head CT 12/05/2022. FINDINGS: Brain: Mild-to-moderate generalized cerebral atrophy. Multifocal T2 FLAIR hyperintense signal abnormality within the cerebral white matter, nonspecific but compatible with minimal chronic small vessel ischemic disease. Curvilinear susceptibility weighted-signal loss within a  sulcus along the mid-to-posterior left frontal lobe. This may reflect a vessel or a small amount of chronic subarachnoid hemorrhage. Expanded and partially empty sella turcica. There is no acute infarct. No evidence of an intracranial mass. No extra-axial fluid collection. No midline shift. Vascular: Maintained flow voids within the proximal large arterial vessels. Possible left frontal lobe developmental venous anomaly (anatomic variant). Skull and upper cervical spine: No focal suspicious marrow lesion. Sinuses/Orbits: No mass or acute finding within the imaged orbits. Postsurgical appearance of the paranasal sinuses. Frothy secretions, and moderate background mucosal thickening, within the right sphenoid sinus. Moderate mucosal thickening within the left sphenoid sinus. Scattered opacification of bilateral ethmoid air cells, overall mild-to-moderate in severity. Mild mucosal thickening within the bilateral frontal sinuses. Other: Trace fluid within the bilateral mastoid air cells. Other: 7 mm Tornwaldt cyst within the midline posterior nasopharynx. IMPRESSION: 1. No evidence of an acute intracranial abnormality. 2. Minimal chronic small vessel ischemic changes within the cerebral white matter. 3. Possible small amount of chronic subarachnoid hemorrhage along the mid-to-posterior left frontal lobe, as described.  4. Mild-to-moderate generalized cerebral atrophy. 5. Paranasal sinus disease as described. Electronically Signed   By: Jackey Loge D.O.   On: 12/07/2022 10:18     Medications:    sodium chloride Stopped (12/07/22 0210)   feeding supplement (VITAL 1.5 CAL) Stopped (12/07/22 2322)   heparin 10,000 units/ 20 mL infusion syringe Stopped (12/08/22 0917)   norepinephrine (LEVOPHED) Adult infusion 9 mcg/min (12/08/22 1045)   prismasol BGK 4/2.5 Stopped (12/07/22 1700)   sodium phosphate 15 mmol in dextrose 5 % 250 mL infusion 15 mmol (12/08/22 1057)    sodium chloride   Intravenous Once   amiodarone   200 mg Per Tube BID   ascorbic acid  500 mg Per Tube BID   atorvastatin  40 mg Per Tube Daily   chlorhexidine  15 mL Mouth Rinse BID   Chlorhexidine Gluconate Cloth  6 each Topical Q0600   vitamin D3  2,000 Units Per Tube Daily   droxidopa  300 mg Oral TID WC   escitalopram  10 mg Per Tube Daily   famotidine  40 mg Per Tube Daily   feeding supplement (PROSource TF20)  60 mL Per Tube Daily   heparin injection (subcutaneous)  5,000 Units Subcutaneous Q8H   hydrocortisone sod succinate (SOLU-CORTEF) inj  50 mg Intravenous Q6H   hydrOXYzine  50 mg Per Tube Q6H   insulin aspart  0-6 Units Subcutaneous Q4H   lactobacillus  1 g Per Tube TID WC   midodrine  15 mg Per Tube TID WC   multivitamin  1 tablet Per Tube QHS   mupirocin ointment   Nasal BID   vancomycin variable dose per unstable renal function (pharmacist dosing)   Does not apply See admin instructions   sodium chloride, acetaminophen **OR** acetaminophen, artificial tears, ipratropium-albuterol, loperamide HCl, magnesium hydroxide, ondansetron **OR** ondansetron (ZOFRAN) IV, mouth rinse, sennosides, sodium chloride, traZODone  Assessment/ Plan:  [name] is a 72 y.o.  female with past medical history of diabetes, COPD, GERD, hypertension. OA, a fib, and end stage renal disease on hemodialysis. Patient presents to the ED from her nursing facility complaining of altered mental status. She has been admitted for Lower urinary tract infectious disease [N39.0] Altered mental status, unspecified altered mental status type [R41.82] Sepsis due to gram-negative UTI (HCC) [A41.50, N39.0] Sepsis, due to unspecified organism, unspecified whether acute organ dysfunction present (HCC) [A41.9]  CK [facility]/TTS/Rt Permcath/81.3kg  End stage renal disease - no acute indication for dialysis a this time.  - holding dialysis at this time - would like to have a line holiday if we can wean off norepinephrine. - monitor daily for  dialysis need. Anticipate restarting dialysis after a 48 hour line holiday.   2. Hypotension due to sepsis. Blood cultures and cath tip cultures with MRSA  - requiring vasopressors.   - continue antibiotics.   3. Anemia of chronic kidney disease  Lab Results  Component Value Date   HGB 6.7 (L) 12/08/2022     - transfusion PRBC   4. Secondary Hyperparathyroidism:    Lab Results  Component Value Date   CALCIUM 8.2 (L) 12/08/2022   PHOS 1.7 (L) 12/08/2022     - replace phosphorus as needed.     LOS: 2 D[place]wi Venable 8/26/202411:10 AM

## 2022-12-08 NOTE — Plan of Care (Signed)
Discussed with patient in front of family plan of care for the evening, pain management and medications with some teach back displayed.  What is important to patient is to decrease itching and sleep.   Problem: Fluid Volume: Goal: Hemodynamic stability will improve Outcome: Progressing   Problem: Education: Goal: Knowledge of General Education information will improve Description: Including pain rating scale, medication(s)/side effects and non-pharmacologic comfort measures Outcome: Progressing   Problem: Health Behavior/Discharge Planning: Goal: Ability to manage health-related needs will improve Outcome: Progressing   Problem: Pain Managment: Goal: General experience of comfort will improve Outcome: Progressing

## 2022-12-08 NOTE — Progress Notes (Signed)
PHARMACY CONSULT NOTE  Pharmacy Consult for Electrolyte Monitoring and Replacement   Recent Labs: Potassium (mmol/L)  Date Value  12/08/2022 3.7  08/11/2013 3.3 (L)   Magnesium (mg/dL)  Date Value  16/01/9603 2.2   Calcium (mg/dL)  Date Value  54/12/8117 8.2 (L)   Calcium, Total (mg/dL)  Date Value  14/78/2956 9.2   Albumin (g/dL)  Date Value  21/30/8657 2.0 (L)  08/11/2013 3.1 (L)   Phosphorus (mg/dL)  Date Value  84/69/6295 1.7 (L)   Sodium (mmol/L)  Date Value  12/08/2022 132 (L)  08/11/2013 138    Assessment: 72 y.o. female with medical history significant for asthma, COPD, type diabetes mellitus, s/p G-tube, GERD, ESRD on HD, hypertension, osteoarthritis, paroxysmal atrial fibrillation and OSA on CPAP, who presented to the emergency room with acute onset of altered mental status with lethargy and decreased responsiveness now on CRRT  Goal of Therapy:  Electrolytes WNL  Plan:  --Na 132, Phos 1.7 ordered sodium phosphate 15 mmol IV per PCCM --Renal function panel BID while on CRRT per protocol  Tressie Ellis 12/08/2022 8:05 AM

## 2022-12-08 NOTE — Plan of Care (Signed)
Problem: Fluid Volume: Goal: Hemodynamic stability will improve 12/08/2022 2332 by Dorma Russell, RN Outcome: Progressing 12/08/2022 2332 by Dorma Russell, RN Outcome: Progressing 12/08/2022 2104 by Dorma Russell, RN Outcome: Progressing   Problem: Clinical Measurements: Goal: Diagnostic test results will improve 12/08/2022 2332 by Dorma Russell, RN Outcome: Progressing 12/08/2022 2332 by Dorma Russell, RN Outcome: Progressing 12/08/2022 2104 by Dorma Russell, RN Outcome: Progressing Goal: Signs and symptoms of infection will decrease 12/08/2022 2332 by Dorma Russell, RN Outcome: Progressing 12/08/2022 2332 by Dorma Russell, RN Outcome: Progressing 12/08/2022 2104 by Dorma Russell, RN Outcome: Progressing   Problem: Respiratory: Goal: Ability to maintain adequate ventilation will improve 12/08/2022 2332 by Dorma Russell, RN Outcome: Progressing 12/08/2022 2332 by Dorma Russell, RN Outcome: Progressing 12/08/2022 2104 by Dorma Russell, RN Outcome: Progressing   Problem: Education: Goal: Knowledge of General Education information will improve Description: Including pain rating scale, medication(s)/side effects and non-pharmacologic comfort measures 12/08/2022 2332 by Dorma Russell, RN Outcome: Progressing 12/08/2022 2332 by Dorma Russell, RN Outcome: Progressing 12/08/2022 2104 by Dorma Russell, RN Outcome: Progressing   Problem: Health Behavior/Discharge Planning: Goal: Ability to manage health-related needs will improve 12/08/2022 2332 by Dorma Russell, RN Outcome: Progressing 12/08/2022 2332 by Dorma Russell, RN Outcome: Progressing 12/08/2022 2104 by Dorma Russell, RN Outcome: Progressing   Problem: Clinical Measurements: Goal: Ability to maintain clinical measurements within normal limits will improve 12/08/2022 2332 by Dorma Russell, RN Outcome: Progressing 12/08/2022 2332 by Dorma Russell, RN Outcome: Progressing 12/08/2022 2104 by  Dorma Russell, RN Outcome: Progressing Goal: Will remain free from infection 12/08/2022 2332 by Dorma Russell, RN Outcome: Progressing 12/08/2022 2332 by Dorma Russell, RN Outcome: Progressing 12/08/2022 2104 by Dorma Russell, RN Outcome: Progressing Goal: Diagnostic test results will improve 12/08/2022 2332 by Dorma Russell, RN Outcome: Progressing 12/08/2022 2332 by Dorma Russell, RN Outcome: Progressing 12/08/2022 2104 by Dorma Russell, RN Outcome: Progressing Goal: Respiratory complications will improve 12/08/2022 2332 by Dorma Russell, RN Outcome: Progressing 12/08/2022 2332 by Dorma Russell, RN Outcome: Progressing 12/08/2022 2104 by Dorma Russell, RN Outcome: Progressing Goal: Cardiovascular complication will be avoided 12/08/2022 2332 by Dorma Russell, RN Outcome: Progressing 12/08/2022 2332 by Dorma Russell, RN Outcome: Progressing 12/08/2022 2104 by Dorma Russell, RN Outcome: Progressing   Problem: Activity: Goal: Risk for activity intolerance will decrease 12/08/2022 2332 by Dorma Russell, RN Outcome: Progressing 12/08/2022 2332 by Dorma Russell, RN Outcome: Progressing 12/08/2022 2104 by Dorma Russell, RN Outcome: Progressing   Problem: Nutrition: Goal: Adequate nutrition will be maintained 12/08/2022 2332 by Dorma Russell, RN Outcome: Progressing 12/08/2022 2332 by Dorma Russell, RN Outcome: Progressing 12/08/2022 2104 by Dorma Russell, RN Outcome: Progressing   Problem: Coping: Goal: Level of anxiety will decrease 12/08/2022 2332 by Dorma Russell, RN Outcome: Progressing 12/08/2022 2332 by Dorma Russell, RN Outcome: Progressing 12/08/2022 2104 by Dorma Russell, RN Outcome: Progressing   Problem: Elimination: Goal: Will not experience complications related to bowel motility 12/08/2022 2332 by Dorma Russell, RN Outcome: Progressing 12/08/2022 2332 by Dorma Russell, RN Outcome: Progressing 12/08/2022 2104 by Dorma Russell, RN Outcome: Progressing Goal: Will not experience complications related to urinary retention 12/08/2022 2332 by Dorma Russell, RN Outcome: Progressing 12/08/2022 2332 by Dorma Russell, RN Outcome: Progressing 12/08/2022 2104 by Dorma Russell, RN  Outcome: Progressing   Problem: Pain Managment: Goal: General experience of comfort will improve 12/08/2022 2332 by Dorma Russell, RN Outcome: Progressing 12/08/2022 2332 by Dorma Russell, RN Outcome: Progressing 12/08/2022 2104 by Dorma Russell, RN Outcome: Progressing   Problem: Safety: Goal: Ability to remain free from injury will improve 12/08/2022 2332 by Dorma Russell, RN Outcome: Progressing 12/08/2022 2332 by Dorma Russell, RN Outcome: Progressing 12/08/2022 2104 by Dorma Russell, RN Outcome: Progressing   Problem: Skin Integrity: Goal: Risk for impaired skin integrity will decrease 12/08/2022 2332 by Dorma Russell, RN Outcome: Progressing 12/08/2022 2332 by Dorma Russell, RN Outcome: Progressing 12/08/2022 2104 by Dorma Russell, RN Outcome: Progressing   Problem: Education: Goal: Ability to describe self-care measures that may prevent or decrease complications (Diabetes Survival Skills Education) will improve 12/08/2022 2332 by Dorma Russell, RN Outcome: Progressing 12/08/2022 2332 by Dorma Russell, RN Outcome: Progressing 12/08/2022 2104 by Dorma Russell, RN Outcome: Progressing Goal: Individualized Educational Video(s) 12/08/2022 2332 by Dorma Russell, RN Outcome: Progressing 12/08/2022 2332 by Dorma Russell, RN Outcome: Progressing 12/08/2022 2104 by Dorma Russell, RN Outcome: Progressing   Problem: Coping: Goal: Ability to adjust to condition or change in health will improve 12/08/2022 2332 by Dorma Russell, RN Outcome: Progressing 12/08/2022 2332 by Dorma Russell, RN Outcome: Progressing 12/08/2022 2104 by Dorma Russell, RN Outcome: Progressing   Problem: Fluid  Volume: Goal: Ability to maintain a balanced intake and output will improve 12/08/2022 2332 by Dorma Russell, RN Outcome: Progressing 12/08/2022 2332 by Dorma Russell, RN Outcome: Progressing 12/08/2022 2104 by Dorma Russell, RN Outcome: Progressing   Problem: Health Behavior/Discharge Planning: Goal: Ability to identify and utilize available resources and services will improve 12/08/2022 2332 by Dorma Russell, RN Outcome: Progressing 12/08/2022 2332 by Dorma Russell, RN Outcome: Progressing 12/08/2022 2104 by Dorma Russell, RN Outcome: Progressing Goal: Ability to manage health-related needs will improve 12/08/2022 2332 by Dorma Russell, RN Outcome: Progressing 12/08/2022 2332 by Dorma Russell, RN Outcome: Progressing 12/08/2022 2104 by Dorma Russell, RN Outcome: Progressing   Problem: Metabolic: Goal: Ability to maintain appropriate glucose levels will improve 12/08/2022 2332 by Dorma Russell, RN Outcome: Progressing 12/08/2022 2332 by Dorma Russell, RN Outcome: Progressing 12/08/2022 2104 by Dorma Russell, RN Outcome: Progressing   Problem: Nutritional: Goal: Maintenance of adequate nutrition will improve 12/08/2022 2332 by Dorma Russell, RN Outcome: Progressing 12/08/2022 2332 by Dorma Russell, RN Outcome: Progressing 12/08/2022 2104 by Dorma Russell, RN Outcome: Progressing Goal: Progress toward achieving an optimal weight will improve 12/08/2022 2332 by Dorma Russell, RN Outcome: Progressing 12/08/2022 2332 by Dorma Russell, RN Outcome: Progressing 12/08/2022 2104 by Dorma Russell, RN Outcome: Progressing   Problem: Skin Integrity: Goal: Risk for impaired skin integrity will decrease 12/08/2022 2332 by Dorma Russell, RN Outcome: Progressing 12/08/2022 2332 by Dorma Russell, RN Outcome: Progressing 12/08/2022 2104 by Dorma Russell, RN Outcome: Progressing   Problem: Tissue Perfusion: Goal: Adequacy of tissue perfusion will  improve 12/08/2022 2332 by Dorma Russell, RN Outcome: Progressing 12/08/2022 2332 by Dorma Russell, RN Outcome: Progressing 12/08/2022 2104 by Dorma Russell, RN Outcome: Progressing

## 2022-12-08 NOTE — Consult Note (Addendum)
NAME: Alexandra Foster  DOB: [birth year]  MRN: [id number]  Date/Time: 12/08/2022 12:18 PM  REQUESTING PROVIDER; Janine Limbo Subjective:  REASON FOR CONSULT: MRSA bacteremia ? Alexandra Foster is a 72 y.o. with a history of diabetes, OSA, COPD, hypertension, , with history of group A streptococcus bacteremia , necrotizing fascitis of left leg, septic shock and multiorgan failure in January 2023 requiring a prolonged hospitalization, needed CRRT followed by hemo dialysis needed tracheostomy which was closed and needing PEG ,  presented from [facility] on 12/05/2022 with altered mental status and lethargy.  They were ruling out aspiration pneumonia.  Had a temperature of 102.2 at the facility when EMS arrived. Patient is currently on dialysis  12/05/22  BP 120/52 !  Temp 101.4 F (38.6 C) !  Pulse Rate 68  Resp 23 !  SpO2 98 %    Latest Reference Range & Units 12/05/22  WBC 4.0 - 10.5 K/uL 21.4 (H)  Hemoglobin 12.0 - 15.0 g/dL 26.9 (L)  HCT 48.5 - 46.2 % 33.4 (L)  Platelets 150 - 400 K/uL 411 (H)  Creatinine 0.44 - 1.00 mg/dL 7.03 (H)  CT head no acute findings CXR no inflitrate Blood culture sent Pt was treated like UTI with ceftriaxone  Blood culture is positive fpr MRSA and I am seeing the patient for the same   Past Medical History:  Diagnosis Date   AKI (acute kidney injury) (HCC)    a. 04/2021 in setting of bacteremia/shock.   Arthritis    Asthma    Bacteremia    a. 04/2021 S pyogenes bacteremia in setting of lower ext cellulitis.   COPD (chronic obstructive pulmonary disease) (HCC)    Diabetes mellitus without complication (HCC)    Endometriosis    GERD (gastroesophageal reflux disease)    History of echocardiogram    a. 07/2013 Echo: EF 55-60%, impaired relaxation, mild TR; b. 04/2021 Echo: EF 50-55%, mild LVH, nl RV fxn, mild BAE, Ao sclerosis w/o stenosis.   Hypertension    Obesity    PAF (paroxysmal atrial fibrillation) (HCC)    a. 04/2021 in setting of  septic shock/cellulitis.   Sleep apnea    CPAP    Past Surgical History:  Procedure Laterality Date   ABDOMINAL HYSTERECTOMY     APPLICATION OF WOUND VAC Left 05/17/2021   Procedure: APPLICATION OF WOUND VAC/WOUND VAC EXCHANGE-Matrix Myriad;  Surgeon: Carolan Shiver, MD;  Location: [facility];  Service: General;  Laterality: Left;   APPLICATION OF WOUND VAC  05/24/2021   Procedure: APPLICATION OF WOUND VAC;  Surgeon: Carolan Shiver, MD;  Location: [facility];  Service: General;;   APPLICATION OF WOUND VAC  05/10/2021   Procedure: APPLICATION OF WOUND VAC;  Surgeon: Carolan Shiver, MD;  Location: [facility];  Service: General;;   COLONOSCOPY  10/29/2006   Dr Servando Snare   COLONOSCOPY WITH PROPOFOL N/A 11/05/2016   Procedure: COLONOSCOPY WITH PROPOFOL;  Surgeon: Earline Mayotte, MD;  Location: [facility];  Service: Endoscopy;  Laterality: N/A;   INCISION AND DRAINAGE OF WOUND Left 05/24/2021   Procedure: IRRIGATION AND DEBRIDEMENT LEFT LEG;  Surgeon: Carolan Shiver, MD;  Location: [facility];  Service: General;  Laterality: Left;   INCISION AND DRAINAGE OF WOUND Left 05/10/2021   Procedure: IRRIGATION AND DEBRIDEMENT LEFT LEG;  Surgeon: Carolan Shiver, MD;  Location: [facility];  Service: General;  Laterality: Left;   IR FLUORO GUIDE CV LINE RIGHT  06/12/2021   IR PERC TUN PERIT CATH WO PORT S&I Judi Cong  06/12/2021   IR REPLC GASTRO/COLONIC TUBE PERCUT W/FLUORO  06/12/2021   IVC FILTER INSERTION N/A 05/29/2021   Procedure: IVC FILTER INSERTION;  Surgeon: Renford Dills, MD;  Location: [facility];  Service: Cardiovascular;  Laterality: N/A;   MINOR GRAFT APPLICATION  05/24/2021   Procedure: Myriad Matrix  APPLICATION;  Surgeon: Carolan Shiver, MD;  Location: [facility];  Service: General;;   NASAL SINUS SURGERY  2002   Dr Chestine Spore   PEG PLACEMENT N/A 05/28/2021   Procedure: PERCUTANEOUS ENDOSCOPIC GASTROSTOMY (PEG) PLACEMENT;  Surgeon: Sung Amabile, DO;  Location:  [facility];  Service: General;  Laterality: N/A;  TRAVEL CASE   TRACHEOSTOMY TUBE PLACEMENT N/A 05/17/2021   Procedure: TRACHEOSTOMY;  Surgeon: Geanie Logan, MD;  Location: [facility];  Service: ENT;  Laterality: N/A;   WOUND DEBRIDEMENT Left 05/07/2021   Procedure: DEBRIDEMENT WOUND;  Surgeon: Carolan Shiver, MD;  Location: [facility];  Service: General;  Laterality: Left;    Social History   Socioeconomic History   Marital status: Married    Spouse name: [name]   Number of children: 1   Years of education: 12   Highest education level: 12th grade  Occupational History    Employer: RETIRED  Tobacco Use   Smoking status: Former    Current packs/day: 0.00    Average packs/day: 2.0 packs/day for 40.0 years (80.0 ttl pk-yrs)    Types: Cigarettes    Start date: 1963    Quit date: 2003    Years since quitting: 21.6   Smokeless tobacco: Former    Types: Snuff    Quit date: 04/2001   Tobacco comments:    smoking cessation materials not required  Vaping Use   Vaping status: Never Used  Substance and Sexual Activity   Alcohol use: No    Alcohol/week: 0.0 standard drinks of alcohol   Drug use: No   Sexual activity: Not Currently  Other Topics Concern   Not on file  Social History Narrative   Lives locally w/ husband.  Does not routinely exercise.   Social Determinants of Health   Financial Resource Strain: Medium Risk (08/09/2020)   Overall Financial Resource Strain (CARDIA)    Difficulty of Paying Living Expenses: Somewhat hard  Food Insecurity: Patient Unable To Answer (12/06/2022)   Hunger Vital Sign    Worried About Running Out of Food in the Last Year: Patient unable to answer    Ran Out of Food in the Last Year: Patient unable to answer  Transportation Needs: No Transportation Needs (12/06/2022)   PRAPARE - Administrator, Civil Service (Medical): No    Lack of Transportation (Non-Medical): No  Physical Activity: Inactive (08/09/2020)   Exercise Vital  Sign    Days of Exercise per Week: 0 days    Minutes of Exercise per Session: 0 min  Stress: No Stress Concern Present (08/09/2020)   [employer] of Occupational Health - Occupational Stress Questionnaire    Feeling of Stress : Not at all  Social Connections: Moderately Integrated (08/09/2020)   Social Connection and Isolation Panel [NHANES]    Frequency of Communication with Friends and Family: More than three times a week    Frequency of Social Gatherings with Friends and Family: More than three times a week    Attends Religious Services: More than 4 times per year    Active Member of [employer] or Organizations: No    Attends Banker Meetings: Never    Marital Status: Married  Intimate Partner Violence: Patient Unable To Answer (12/06/2022)   Humiliation, Afraid, Rape, and Kick questionnaire    Fear of Current or Ex-Partner: Patient unable to answer    Emotionally Abused: Patient unable to answer    Physically Abused: Patient unable to answer    Sexually Abused: Patient unable to answer    Family History  Problem Relation Age of Onset   Congestive Heart Failure Mother    Coronary artery disease Father 16   Breast cancer Sister 50   Heart disease Brother    Varicose Veins Brother    Alcohol abuse Brother    No Known Allergies I? Current Facility-Administered Medications  Medication Dose Route Frequency Provider Last Rate Last Admin   0.9 %  sodium chloride infusion (Manually program via Guardrails IV Fluids)   Intravenous Once Jimmye Norman, NP       0.9 %  sodium chloride infusion   Intravenous PRN Erin Fulling, MD   Stopped at 12/07/22 0210   acetaminophen (TYLENOL) tablet 650 mg  650 mg Oral Q6H PRN Mansy, Jan A, MD   650 mg at 12/06/22 2138   Or   acetaminophen (TYLENOL) suppository 650 mg  650 mg Rectal Q6H PRN Mansy, Jan A, MD   650 mg at 12/06/22 0317   amiodarone (PACERONE) tablet 200 mg  200 mg Per Tube BID Mansy, Jan A, MD   200 mg at 12/08/22  1051   artificial tears (LACRILUBE) ophthalmic ointment 1 Application  1 Application Both Eyes Q4H PRN Mansy, Jan A, MD       ascorbic acid (VITAMIN C) tablet 500 mg  500 mg Per Tube BID Mansy, Jan A, MD   500 mg at 12/08/22 1051   atorvastatin (LIPITOR) tablet 40 mg  40 mg Per Tube Daily Erin Fulling, MD   40 mg at 12/08/22 1052   chlorhexidine (PERIDEX) 0.12 % solution 15 mL  15 mL Mouth Rinse BID Mansy, Jan A, MD   15 mL at 12/08/22 6967   Chlorhexidine Gluconate Cloth 2 % PADS 6 each  6 each Topical Q0600 Lurene Shadow, MD   6 each at 12/07/22 2325   cholecalciferol (VITAMIN D3) 25 MCG (1000 UNIT) tablet 2,000 Units  2,000 Units Per Tube Daily Mansy, Jan A, MD   2,000 Units at 12/08/22 1052   escitalopram (LEXAPRO) tablet 10 mg  10 mg Per Tube Daily Mansy, Jan A, MD   10 mg at 12/08/22 1052   famotidine (PEPCID) 40 MG/5ML suspension 40 mg  40 mg Per Tube Daily Mansy, Jan A, MD   40 mg at 12/08/22 1052   feeding supplement (PROSource TF20) liquid 60 mL  60 mL Per Tube Daily Erin Fulling, MD   60 mL at 12/08/22 1052   feeding supplement (VITAL 1.5 CAL) liquid 1,000 mL  1,000 mL Per Tube Continuous Erin Fulling, MD   Held at 12/07/22 2322   heparin 10,000 units/ 20 mL infusion syringe  1,000 Units/hr CRRT Continuous Lamont Dowdy, MD   Stopped at 12/08/22 0917   heparin injection 5,000 Units  5,000 Units Subcutaneous Q8H Mansy, Jan A, MD   5,000 Units at 12/08/22 0526   hydrocortisone sodium succinate (SOLU-CORTEF) 100 MG injection 50 mg  50 mg Intravenous Q6H Vida Rigger, MD   50 mg at 12/08/22 1217   hydrOXYzine (ATARAX) tablet 50 mg  50 mg Per Tube Q6H Ezequiel Essex, NP   50 mg at 12/08/22 1217   insulin aspart (novoLOG) injection  0-6 Units  0-6 Units Subcutaneous Q4H Ezequiel Essex, NP       ipratropium-albuterol (DUONEB) 0.5-2.5 (3) MG/3ML nebulizer solution 3 mL  3 mL Nebulization Q4H PRN Harlon Ditty D, NP       lactobacillus (FLORANEX/LACTINEX) granules 1 g  1 g Per Tube TID WC  Mansy, Jan A, MD   1 g at 12/06/22 1610   loperamide HCl (IMODIUM) 1 MG/7.5ML solution 2 mg  2 mg Per Tube PRN Mansy, Jan A, MD       magnesium hydroxide (MILK OF MAGNESIA) suspension 30 mL  30 mL Oral Daily PRN Mansy, Jan A, MD       midodrine (PROAMATINE) tablet 15 mg  15 mg Per Tube TID WC Mansy, Jan A, MD   15 mg at 12/08/22 1217   multivitamin (RENA-VIT) tablet 1 tablet  1 tablet Per Tube QHS Mansy, Jan A, MD   1 tablet at 12/07/22 2123   mupirocin ointment (BACTROBAN) 2 %   Nasal BID Lurene Shadow, MD   Given at 12/08/22 1058   norepinephrine (LEVOPHED) 16 mg in (0.064 mg/mL) premix infusion  0-40 mcg/min Intravenous Titrated Lowella Bandy, RPH 8.44 mL/hr at 12/08/22 1200 9 mcg/min at 12/08/22 1200   ondansetron (ZOFRAN) tablet 4 mg  4 mg Oral Q6H PRN Mansy, Jan A, MD       Or   ondansetron [facility]) injection 4 mg  4 mg Intravenous Q6H PRN Mansy, Vernetta Honey, MD       Oral care mouth rinse  15 mL Mouth Rinse 4 times per day Vida Rigger, MD       prismasol BGK 4/2.5 infusion   CRRT Continuous Kolluru, Sarath, MD   Stopped at 12/07/22 1700   sennosides (SENOKOT) 8.8 MG/5ML syrup 10 mL  10 mL Per Tube QHS PRN Mansy, Jan A, MD       sodium chloride 0.9 % primer fluid for CRRT   CRRT PRN Kolluru, Sarath, MD   2,000 mL at 12/06/22 2013   sodium phosphate 15 mmol in dextrose 5 % 250 mL infusion  15 mmol Intravenous Once Ezequiel Essex, NP 43 mL/hr at 12/08/22 1200 Infusion Verify at 12/08/22 1200   traZODone (DESYREL) tablet 25 mg  25 mg Oral QHS PRN Mansy, Jan A, MD   25 mg at 12/07/22 2341   vancomycin variable dose per unstable renal function (pharmacist dosing)   Does not apply See admin instructions Tressie Ellis, RPH         Abtx:  Anti-infectives (From admission, onward)    Start     Dose/Rate Route Frequency Ordered Stop   12/08/22 1040  vancomycin variable dose per unstable renal function (pharmacist dosing)         Does not apply See admin instructions 12/08/22 1041      12/07/22 1400  vancomycin (VANCOCIN) IVPB 1000 mg/200 mL premix  Status:  Discontinued        1,000 mg 200 mL/hr over 60 Minutes Intravenous Every 24 hours 12/06/22 1234 12/08/22 1041   12/06/22 2200  cefTRIAXone (ROCEPHIN) 2 g in sodium chloride 0.9 % 100 mL IVPB  Status:  Discontinued        2 g 200 mL/hr over 30 Minutes Intravenous Every 24 hours 12/06/22 0132 12/06/22 1223   12/06/22 1500  ceFEPIme (MAXIPIME) 2 g in sodium chloride 0.9 % 100 mL IVPB  Status:  Discontinued        2 g 200 mL/hr  over 30 Minutes Intravenous Every 12 hours 12/06/22 1252 12/08/22 1040   12/06/22 1315  vancomycin (VANCOREADY) IVPB 1750 mg/350 mL        1,750 mg 175 mL/hr over 120 Minutes Intravenous  Once 12/06/22 1224 12/06/22 1724   12/05/22 2130  cefTRIAXone (ROCEPHIN) 2 g in sodium chloride 0.9 % 100 mL IVPB  Status:  Discontinued        2 g 200 mL/hr over 30 Minutes Intravenous Every 24 hours 12/05/22 2115 12/06/22 0202   12/05/22 2130  azithromycin (ZITHROMAX) 500 mg in sodium chloride 0.9 % 250 mL IVPB  Status:  Discontinued        500 mg 250 mL/hr over 60 Minutes Intravenous Every 24 hours 12/05/22 2115 12/06/22 1223       REVIEW OF SYSTEMS:  Limited history from patient Says she is feeling better Objective:  VITALS:  BP (!) 97/48   Pulse 72   Temp 97.7 F (36.5 C) (Oral)   Resp 16   Ht 5\' 4"  (1.626 m)   Wt 90.3 kg   SpO2 100%   BMI 34.17 kg/m   PHYSICAL EXAM:  General: awake, oriented in person, place   Head: Normocephalic, without obvious abnormality, atraumatic. Eyes: Conjunctivae clear, anicteric sclerae. Pupils are equal ENT Nares normal. No drainage or sinus tenderness. Lips, mucosa, and tongue normal. No Thrush Neck: tracheostomy scar healed well  Lungs: b/l air entry Heart: s1s2 Abdomen: Soft, peg in place obsese Extremities: left AKA Skin: No rashes or lesions. Or bruising Lymph: Cervical, supraclavicular normal. Neurologic:dysarthria Tremors rt hand and  leg Pertinent Labs Lab Results CBC    Component Value Date/Time   WBC 34.5 (H) 12/08/2022 0613   RBC 2.59 (L) 12/08/2022 0613   HGB 6.7 (L) 12/08/2022 0613   HGB 13.5 08/11/2013 0933   HCT 21.7 (L) 12/08/2022 0613   HCT 42.6 08/11/2013 0933   PLT 256 12/08/2022 0613   PLT 300 08/11/2013 0933   MCV 83.8 12/08/2022 0613   MCV 79 (L) 08/11/2013 0933   MCH 25.9 (L) 12/08/2022 0613   MCHC 30.9 12/08/2022 0613   RDW 15.6 (H) 12/08/2022 0613   RDW 14.1 08/11/2013 0933   LYMPHSABS 0.6 (L) 12/05/2022 2056   LYMPHSABS 3.2 08/11/2013 0933   MONOABS 0.8 12/05/2022 2056   MONOABS 0.6 08/11/2013 0933   EOSABS 0.0 12/05/2022 2056   EOSABS 1.2 (H) 08/11/2013 0933   BASOSABS 0.1 12/05/2022 2056   BASOSABS 0.1 08/11/2013 0933       Latest Ref Rng & Units 12/08/2022    6:13 AM 12/08/2022    5:25 AM 12/07/2022    4:08 PM  CMP  Glucose 70 - 99 mg/dL 010  272  536   BUN 8 - 23 mg/dL 38  41  26   Creatinine 0.44 - 1.00 mg/dL 6.44  0.34  7.42   Sodium 135 - 145 mmol/L 132  130  132   Potassium 3.5 - 5.1 mmol/L 3.7  3.6  3.9   Chloride 98 - 111 mmol/L 102  100  101   CO2 22 - 32 mmol/L 20  21  21    Calcium 8.9 - 10.3 mg/dL 8.2  8.2  7.7       Microbiology: Recent Results (from the past 240 hour(s))  Culture, blood (Routine x 2)     Status: Abnormal   Collection Time: 12/05/22  8:56 PM   Specimen: BLOOD  Result Value Ref Range Status   Specimen Description  Final    BLOOD BLOOD RIGHT ARM Performed at [facility], [address] [place], [place] [address]    Special Requests   Final    BOTTLES DRAWN AEROBIC AND ANAEROBIC Blood Culture adequate volume Performed at [facility], [address] [place], [place] [address]    Culture  Setup Time   Final    GRAM POSITIVE COCCI IN BOTH AEROBIC AND ANAEROBIC BOTTLES CRITICAL RESULT CALLED TO, READ BACK BY AND VERIFIED WITH: SHEEMA HALLLAJI AT 1206 12/06/22.PMF Performed at [facility], [address] [place], [place] [address]    Culture (A)  Final    STAPHYLOCOCCUS AUREUS SUSCEPTIBILITIES PERFORMED ON PREVIOUS CULTURE WITHIN THE LAST 5 DAYS. Performed at [facility], [address], [place], [place] [address]    Report Status 12/08/2022 FINAL  Final  Culture, blood (Routine x 2)     Status: Abnormal   Collection Time: 12/05/22  8:57 PM   Specimen: BLOOD  Result Value Ref Range Status   Specimen Description   Final    BLOOD BLOOD LEFT ARM Performed at [facility], [address] [place], [place] [address]    Special Requests   Final    BOTTLES DRAWN AEROBIC AND ANAEROBIC Blood Culture adequate volume Performed at [facility], [address] [place], [place] [address]    Culture  Setup Time   Final    GRAM POSITIVE COCCI IN BOTH AEROBIC AND ANAEROBIC BOTTLES CRITICAL RESULT CALLED TO, READ BACK BY AND VERIFIED WITH: SHEEMA HALLAJI AT 1206 12/06/22.PMF Performed at [facility], [address], [place], [place] [address]    Culture METHICILLIN RESISTANT STAPHYLOCOCCUS AUREUS (A)  Final   Report Status 12/08/2022 FINAL  Final   Organism ID, Bacteria METHICILLIN RESISTANT STAPHYLOCOCCUS AUREUS  Final      Susceptibility   Methicillin resistant staphylococcus aureus - MIC*    CIPROFLOXACIN >=8 RESISTANT Resistant     ERYTHROMYCIN >=8 RESISTANT Resistant     GENTAMICIN <=0.5 SENSITIVE Sensitive     OXACILLIN >=4 RESISTANT Resistant     TETRACYCLINE <=1 SENSITIVE Sensitive     VANCOMYCIN <=0.5 SENSITIVE Sensitive     TRIMETH/SULFA >=320 RESISTANT Resistant     CLINDAMYCIN <=0.25 SENSITIVE Sensitive     RIFAMPIN <=0.5 SENSITIVE Sensitive     Inducible Clindamycin NEGATIVE Sensitive     LINEZOLID 2 SENSITIVE Sensitive     * METHICILLIN RESISTANT STAPHYLOCOCCUS AUREUS  Blood Culture ID Panel (Reflexed)     Status: Abnormal   Collection Time: 12/05/22  8:57 PM  Result Value Ref Range Status   Enterococcus faecalis NOT DETECTED NOT DETECTED  Final   Enterococcus Faecium NOT DETECTED NOT DETECTED Final   Listeria monocytogenes NOT DETECTED NOT DETECTED Final   Staphylococcus species DETECTED (A) NOT DETECTED Final    Comment: CRITICAL RESULT CALLED TO, READ BACK BY AND VERIFIED WITH: SHEEMA HALLAJI AT 1206 12/06/22.PMF    Staphylococcus aureus (BCID) DETECTED (A) NOT DETECTED Final    Comment: Methicillin (oxacillin)-resistant Staphylococcus aureus (MRSA). MRSA is predictably resistant to beta-lactam antibiotics (except ceftaroline). Preferred therapy is vancomycin unless clinically contraindicated. Patient requires contact precautions if  hospitalized. CRITICAL RESULT CALLED TO, READ BACK BY AND VERIFIED WITH: SHEEMA HALLAJI AT 1206 12/06/22.PMF    Staphylococcus epidermidis DETECTED (A) NOT DETECTED Final    Comment: CRITICAL RESULT CALLED TO, READ BACK BY AND VERIFIED WITH: SHEEMA HALLAJI AT 1206 12/06/22.PMF    Staphylococcus lugdunensis NOT DETECTED  NOT DETECTED Final   Streptococcus species NOT DETECTED NOT DETECTED Final   Streptococcus agalactiae NOT DETECTED NOT DETECTED Final   Streptococcus pneumoniae NOT DETECTED NOT DETECTED Final   Streptococcus pyogenes NOT DETECTED NOT DETECTED Final   A.calcoaceticus-baumannii NOT DETECTED NOT DETECTED Final   Bacteroides fragilis NOT DETECTED NOT DETECTED Final   Enterobacterales NOT DETECTED NOT DETECTED Final   Enterobacter cloacae complex NOT DETECTED NOT DETECTED Final   Escherichia coli NOT DETECTED NOT DETECTED Final   Klebsiella aerogenes NOT DETECTED NOT DETECTED Final   Klebsiella oxytoca NOT DETECTED NOT DETECTED Final   Klebsiella pneumoniae NOT DETECTED NOT DETECTED Final   Proteus species NOT DETECTED NOT DETECTED Final   Salmonella species NOT DETECTED NOT DETECTED Final   Serratia marcescens NOT DETECTED NOT DETECTED Final   Haemophilus influenzae NOT DETECTED NOT DETECTED Final   Neisseria meningitidis NOT DETECTED NOT DETECTED Final   Pseudomonas  aeruginosa NOT DETECTED NOT DETECTED Final   Stenotrophomonas maltophilia NOT DETECTED NOT DETECTED Final   Candida albicans NOT DETECTED NOT DETECTED Final   Candida auris NOT DETECTED NOT DETECTED Final   Candida glabrata NOT DETECTED NOT DETECTED Final   Candida krusei NOT DETECTED NOT DETECTED Final   Candida parapsilosis NOT DETECTED NOT DETECTED Final   Candida tropicalis NOT DETECTED NOT DETECTED Final   Cryptococcus neoformans/gattii NOT DETECTED NOT DETECTED Final   Methicillin resistance mecA/C DETECTED (A) NOT DETECTED Final    Comment: CRITICAL RESULT CALLED TO, READ BACK BY AND VERIFIED WITH: SHEEMA HALLAJI AT 1206 12/06/22.PMF    Meth resistant mecA/C and MREJ DETECTED (A) NOT DETECTED Final    Comment: CRITICAL RESULT CALLED TO, READ BACK BY AND VERIFIED WITH: SHEEMA HALLAJI AT 1206 12/06/22.PMF Performed at [facility], [address] [place], [place] [address]   Resp panel by RT-PCR (RSV, Flu A&B, Covid) Anterior Nasal Swab     Status: None   Collection Time: 12/05/22  9:53 PM   Specimen: Anterior Nasal Swab  Result Value Ref Range Status   SARS Coronavirus 2 by RT PCR NEGATIVE NEGATIVE Final    Comment: (NOTE) SARS-CoV-2 target nucleic acids are NOT DETECTED.  The SARS-CoV-2 RNA is generally detectable in upper respiratory specimens during the acute phase of infection. The lowest concentration of SARS-CoV-2 viral copies this assay can detect is 138 copies/mL. A negative result does not preclude SARS-Cov-2 infection and should not be used as the sole basis for treatment or other patient management decisions. A negative result may occur with  improper specimen collection/handling, submission of specimen other than nasopharyngeal swab, presence of viral mutation(s) within the areas targeted by this assay, and inadequate number of viral copies(<138 copies/mL). A negative result must be combined with clinical observations, patient history, and  epidemiological information. The expected result is Negative.  Fact Sheet for Patients:  BloggerCourse.com  Fact Sheet for Healthcare Providers:  SeriousBroker.it  This test is no t yet approved or cleared by the [place] FDA and  has been authorized for detection and/or diagnosis of SARS-CoV-2 by FDA under an Emergency Use Authorization (EUA). This EUA will remain  in effect (meaning this test can be used) for the duration of the COVID-19 declaration under Section 564(b)(1) of the Act, 21 U.S.C.section 360bbb-3(b)(1), unless the authorization is terminated  or revoked sooner.       Influenza A by PCR NEGATIVE NEGATIVE Final   Influenza B by PCR NEGATIVE NEGATIVE Final    Comment: (NOTE) The Xpert Xpress SARS-CoV-2/FLU/RSV  plus assay is intended as an aid in the diagnosis of influenza from Nasopharyngeal swab specimens and should not be used as a sole basis for treatment. Nasal washings and aspirates are unacceptable for Xpert Xpress SARS-CoV-2/FLU/RSV testing.  Fact Sheet for Patients: BloggerCourse.com  Fact Sheet for Healthcare Providers: SeriousBroker.it  This test is not yet approved or cleared by the [place] FDA and has been authorized for detection and/or diagnosis of SARS-CoV-2 by FDA under an Emergency Use Authorization (EUA). This EUA will remain in effect (meaning this test can be used) for the duration of the COVID-19 declaration under Section 564(b)(1) of the Act, 21 U.S.C. section 360bbb-3(b)(1), unless the authorization is terminated or revoked.     Resp Syncytial Virus by PCR NEGATIVE NEGATIVE Final    Comment: (NOTE) Fact Sheet for Patients: BloggerCourse.com  Fact Sheet for Healthcare Providers: SeriousBroker.it  This test is not yet approved or cleared by the [place] FDA and has been  authorized for detection and/or diagnosis of SARS-CoV-2 by FDA under an Emergency Use Authorization (EUA). This EUA will remain in effect (meaning this test can be used) for the duration of the COVID-19 declaration under Section 564(b)(1) of the Act, 21 U.S.C. section 360bbb-3(b)(1), unless the authorization is terminated or revoked.  Performed at [facility], [address] [place], [place] [address]   MRSA Next Gen by PCR, Nasal     Status: Abnormal   Collection Time: 12/06/22  4:27 AM   Specimen: Nasal Mucosa; Nasal Swab  Result Value Ref Range Status   MRSA by PCR Next Gen DETECTED (A) NOT DETECTED Final    Comment: RESULT CALLED TO, READ BACK BY AND VERIFIED WITH: KRISTINE CHAMBERS AT 2956 12/06/22.PMF (NOTE) The GeneXpert MRSA Assay (FDA approved for NASAL specimens only), is one component of a comprehensive MRSA colonization surveillance program. It is not intended to diagnose MRSA infection nor to guide or monitor treatment for MRSA infections. Test performance is not FDA approved in patients less than 50 years old. Performed at [facility], [address] [place], [place] [address]   Cath Tip Culture     Status: Abnormal   Collection Time: 12/06/22  2:22 PM   Specimen: Catheter Tip; Other  Result Value Ref Range Status   Specimen Description   Final    CATH TIP Performed at [facility], [address] [place], [place] [address]    Special Requests   Final    NONE Performed at [facility], [address] [place], [place] [address]    Culture (A)  Final    >=100,000 COLONIES/mL METHICILLIN RESISTANT STAPHYLOCOCCUS AUREUS   Report Status 12/08/2022 FINAL  Final   Organism ID, Bacteria METHICILLIN RESISTANT STAPHYLOCOCCUS AUREUS (A)  Final      Susceptibility   Methicillin resistant staphylococcus aureus - MIC*    CIPROFLOXACIN >=8 RESISTANT Resistant     ERYTHROMYCIN >=8 RESISTANT Resistant     GENTAMICIN <=0.5  SENSITIVE Sensitive     OXACILLIN >=4 RESISTANT Resistant     TETRACYCLINE <=1 SENSITIVE Sensitive     VANCOMYCIN <=0.5 SENSITIVE Sensitive     TRIMETH/SULFA >=320 RESISTANT Resistant     CLINDAMYCIN <=0.25 SENSITIVE Sensitive     RIFAMPIN <=0.5 SENSITIVE Sensitive     Inducible Clindamycin NEGATIVE Sensitive     LINEZOLID 2 SENSITIVE Sensitive     * >=100,000 COLONIES/mL METHICILLIN RESISTANT STAPHYLOCOCCUS AUREUS  Urine Culture (for pregnant, neutropenic or urologic patients or patients with an indwelling  urinary catheter)     Status: Abnormal (Preliminary result)   Collection Time: 12/06/22  5:41 PM   Specimen: Urine, Clean Catch  Result Value Ref Range Status   Specimen Description   Final    URINE, CLEAN CATCH Performed at [facility], [address], [place], [place] [address]    Special Requests   Final    Normal Performed at [facility], [address] [place], [place] [address]    Culture (A)  Final    70,000 COLONIES/mL ENTEROCOCCUS FAECALIS 70,000 COLONIES/mL YEAST SUSCEPTIBILITIES TO FOLLOW Performed at [facility], [address], [place], [place] [address]    Report Status PENDING  Incomplete  Culture, blood (Routine X 2) w Reflex to ID Panel     Status: None (Preliminary result)   Collection Time: 12/07/22  1:31 PM   Specimen: BLOOD  Result Value Ref Range Status   Specimen Description BLOOD BLOOD RIGHT HAND  Final   Special Requests   Final    BOTTLES DRAWN AEROBIC ONLY Blood Culture adequate volume   Culture   Final    NO GROWTH < 24 HOURS Performed at [facility], [address] [place], [place] [address]    Report Status PENDING  Incomplete  Culture, blood (Routine X 2) w Reflex to ID Panel     Status: None (Preliminary result)   Collection Time: 12/07/22  6:41 PM   Specimen: BLOOD  Result Value Ref Range Status   Specimen Description BLOOD BLOOD RIGHT HAND  Final   Special Requests   Final    BOTTLES DRAWN  AEROBIC AND ANAEROBIC Blood Culture results may not be optimal due to an inadequate volume of blood received in culture bottles   Culture   Final    NO GROWTH < 24 HOURS Performed at [facility], [address], [place], [place] [address]    Report Status PENDING  Incomplete    IMAGING RESULTS:  I have personally reviewed the films ?no infiltrate  MRI Mild to moderate generalized cerebral atrophy 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 Severe leucocytosis Could be secondary to the severe anemia  Anemia Hb down to 6.7? 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

## 2022-12-08 NOTE — Plan of Care (Signed)

## 2022-12-08 NOTE — Progress Notes (Addendum)
Nutrition Follow-up  DOCUMENTATION CODES:   Obesity unspecified  INTERVENTION:   Nocturnal tube feeds:  Nepro @65ml /hr x 16 hrs (from 1800-1000)  ProSource TF 20- Give 60ml daily via tube, each supplement provides 80kcal and 20g of protein.   Free water flushes 30ml q4 hours to maintain tube patency   Regimen provides 1952kcal/day, 104g/day protein and 994ml/day of free water.   Rena-vit daily via tube   Juven Fruit Punch BID via tube, each serving provides 95kcal and 2.5g of protein (amino acids glutamine and arginine)  Pt at high refeed risk; recommend monitor potassium, magnesium and phosphorus labs daily until stable  Daily weights   Check vitamins E, A, C, D, zinc and copper  NUTRITION DIAGNOSIS:   Inadequate oral intake related to dysphagia as evidenced by NPO status (pt with chronic G-tube).  GOAL:   Patient will meet greater than or equal to 90% of their needs  MONITOR:   Labs, Weight trends, TF tolerance, Skin, I & O's  REASON FOR ASSESSMENT:   Consult Assessment of nutrition requirement/status  ASSESSMENT:   72 y.o. female with a history of asthma/COPD, HTN, DMII, obesity, OSA on CPAP, SDH, GERD, ESRD on HD, afib, anxiety, depression, possible cirrhosis and a lengthy admission (04/2021) for necrotizing fasciitis, bacteremia and sepsos ( requiring L AKA, tracheostomy and G-tube placement) and who is now admitted with acute metabolic encephalopathy in setting of severe sepsis with septic shock due to MRSA & staph epi bacteremia (suspected from right chest permcath) and UTI.  Met with pt and pt's daughter in room today. Pt is well known to this RD from a previous lengthy admission. Daughter reports that patient has been doing well pta. Pt continues to receive full nutrition support via her G-tube. Daughter reports that patient was placed on an oral diet and was eating well but she continued to get food stuck in her throat and was made NPO again. Spoke with Charity fundraiser at  [employer]. Pt's home tube feed regimen is Nepro @65ml /hr x 16 hrs overnight (from 1800-1000). Pt gets 60ml water flushes with medications and before starting and after completion of tube feeds. Pt does not receive any protein modulars. Daughter reports that pt was placed on nocturnal tube feeds because the continuous feeds were making her too full and causing her to vomit. Pt's last weight from SNF was 191.6lbs on 8/20. Per chart, pt is down 69lbs(26%) from her last admission (includes AKA) but does appear weight stable pta. Pt is currently up ~8lbs from her UBW. CRRT initiated this admission but was discontinued yesterday secondary to loss of line access. Plan is for pt to continue with HD once access regained. Pt continues to have sacral wound but this is much improved.   Pt with h/o numerous vitamin deficiencies. Pt has remained on vitamin C, vitamin D and zinc for over a year. RD will recheck vitamin labs this admission.   Medications reviewed and include: vitamin C, vitamin D, pepcid, heparin, solu-cortef, insulin, lactobacillus, rena-vit, levophed, Na phosphate  Labs reviewed: Na 132(L), K 3.7 wnl, BUN 38(H), creat 2.57(H), P 1.7(L), Mg 2.2 wnl Wbc- 34.5(L), Hgb 6.7(L), Hct 21.7(L) Cbgs- 168, 163, 170 x 24 hrs  AIC 5.2- 8/25  NUTRITION - FOCUSED PHYSICAL EXAM:  Flowsheet Row Most Recent Value  Orbital Region No depletion  Upper Arm Region No depletion  Thoracic and Lumbar Region No depletion  Buccal Region No depletion  Temple Region No depletion  Clavicle Bone Region No depletion  Clavicle and  Acromion Bone Region No depletion  Scapular Bone Region No depletion  Dorsal Hand No depletion  Patellar Region No depletion  Anterior Thigh Region No depletion  Posterior Calf Region No depletion  Edema (RD Assessment) Mild  Hair Reviewed  Eyes Reviewed  Mouth Reviewed  Skin Reviewed  Nails Reviewed   Diet Order:   Diet Order     None      EDUCATION NEEDS:   No education  needs have been identified at this time  Skin:  Skin Assessment: Skin Integrity Issues: Skin Integrity Issues:: Stage II Stage II: L/R buttocks  Last BM:  8/26- type 4  Height:   Ht Readings from Last 1 Encounters:  12/05/22 5\' 4"  (1.626 m)    Weight:   Wt Readings from Last 1 Encounters:  12/08/22 90.3 kg   BMI:  Body mass index is 34.17 kg/m.  Estimated Nutritional Needs:   Kcal:  1800-2100kcal/day  Protein:  90-105g/day  Fluid:  UOP +1L  Betsey Holiday MS, RD, LDN Please refer to [facility] for RD and/or RD on-call/weekend/after hours pager

## 2022-12-09 ENCOUNTER — Inpatient Hospital Stay: Payer: Medicare Other

## 2022-12-09 DIAGNOSIS — A419 Sepsis, unspecified organism: Secondary | ICD-10-CM | POA: Diagnosis not present

## 2022-12-09 DIAGNOSIS — Z7189 Other specified counseling: Secondary | ICD-10-CM

## 2022-12-09 DIAGNOSIS — R6521 Severe sepsis with septic shock: Secondary | ICD-10-CM | POA: Diagnosis not present

## 2022-12-09 LAB — TYPE AND SCREEN
ABO/RH(D): O POS
Antibody Screen: NEGATIVE
Unit division: 0

## 2022-12-09 LAB — CBC
HCT: 22.7 % — ABNORMAL LOW (ref 36.0–46.0)
Hemoglobin: 7.4 g/dL — ABNORMAL LOW (ref 12.0–15.0)
MCH: 27.2 pg (ref 26.0–34.0)
MCHC: 32.6 g/dL (ref 30.0–36.0)
MCV: 83.5 fL (ref 80.0–100.0)
Platelets: 213 10*3/uL (ref 150–400)
RBC: 2.72 MIL/uL — ABNORMAL LOW (ref 3.87–5.11)
RDW: 15.9 % — ABNORMAL HIGH (ref 11.5–15.5)
WBC: 33.7 10*3/uL — ABNORMAL HIGH (ref 4.0–10.5)
nRBC: 1.7 % — ABNORMAL HIGH (ref 0.0–0.2)

## 2022-12-09 LAB — POTASSIUM
Potassium: 3 mmol/L — ABNORMAL LOW (ref 3.5–5.1)
Potassium: 4.2 mmol/L (ref 3.5–5.1)

## 2022-12-09 LAB — HEPATITIS B SURFACE ANTIBODY, QUANTITATIVE: Hep B S AB Quant (Post): <3.5 m[IU]/mL — ABNORMAL LOW

## 2022-12-09 LAB — RENAL FUNCTION PANEL
Albumin: 2.1 g/dL — ABNORMAL LOW (ref 3.5–5.0)
Anion gap: 10 (ref 5–15)
BUN: 57 mg/dL — ABNORMAL HIGH (ref 8–23)
CO2: 20 mmol/L — ABNORMAL LOW (ref 22–32)
Calcium: 8.4 mg/dL — ABNORMAL LOW (ref 8.9–10.3)
Chloride: 102 mmol/L (ref 98–111)
Creatinine, Ser: 2.88 mg/dL — ABNORMAL HIGH (ref 0.44–1.00)
GFR, Estimated: 17 mL/min — ABNORMAL LOW (ref >60)
Glucose, Bld: 191 mg/dL — ABNORMAL HIGH (ref 70–99)
Phosphorus: 1.7 mg/dL — ABNORMAL LOW (ref 2.5–4.6)
Potassium: 2.9 mmol/L — ABNORMAL LOW (ref 3.5–5.1)
Sodium: 132 mmol/L — ABNORMAL LOW (ref 135–145)

## 2022-12-09 LAB — URINE CULTURE
Culture: 70000 — AB
Special Requests: NORMAL

## 2022-12-09 LAB — GLUCOSE, CAPILLARY
Glucose-Capillary: 129 mg/dL — ABNORMAL HIGH (ref 70–99)
Glucose-Capillary: 150 mg/dL — ABNORMAL HIGH (ref 70–99)
Glucose-Capillary: 175 mg/dL — ABNORMAL HIGH (ref 70–99)
Glucose-Capillary: 175 mg/dL — ABNORMAL HIGH (ref 70–99)

## 2022-12-09 LAB — BPAM RBC
Blood Product Expiration Date: 202409242359
ISSUE DATE / TIME: 202408261210
Unit Type and Rh: 5100

## 2022-12-09 LAB — PHOSPHORUS
Phosphorus: 1.1 mg/dL — ABNORMAL LOW (ref 2.5–4.6)
Phosphorus: 1.1 mg/dL — ABNORMAL LOW (ref 2.5–4.6)

## 2022-12-09 LAB — MAGNESIUM: Magnesium: 2.4 mg/dL (ref 1.7–2.4)

## 2022-12-09 LAB — PROCALCITONIN: Procalcitonin: 4.32 ng/mL

## 2022-12-09 MED ORDER — POTASSIUM & SODIUM PHOSPHATES 280-160-250 MG PO PACK
2.0000 | PACK | Freq: Once | ORAL | Status: AC
  Administered 2022-12-09: 2 via ORAL
  Filled 2022-12-09: qty 2

## 2022-12-09 MED ORDER — GERHARDT'S BUTT CREAM
TOPICAL_CREAM | Freq: Two times a day (BID) | CUTANEOUS | Status: AC
  Administered 2022-12-10 – 2022-12-17 (×3): 1 via TOPICAL
  Filled 2022-12-09 (×3): qty 1

## 2022-12-09 MED ORDER — VANCOMYCIN HCL 750 MG/150ML IV SOLN
750.0000 mg | Freq: Once | INTRAVENOUS | Status: AC
  Administered 2022-12-09: 750 mg via INTRAVENOUS
  Filled 2022-12-09: qty 150

## 2022-12-09 MED ORDER — TRAZODONE HCL 50 MG PO TABS
25.0000 mg | ORAL_TABLET | Freq: Every evening | ORAL | Status: DC | PRN
  Administered 2022-12-09 – 2022-12-30 (×11): 25 mg
  Filled 2022-12-09 (×11): qty 1

## 2022-12-09 MED ORDER — K PHOS MONO-SOD PHOS DI & MONO 155-852-130 MG PO TABS
250.0000 mg | ORAL_TABLET | Freq: Once | ORAL | Status: AC
  Administered 2022-12-09: 250 mg
  Filled 2022-12-09: qty 1

## 2022-12-09 MED ORDER — POTASSIUM CHLORIDE CRYS ER 20 MEQ PO TBCR: 40.0000 meq | EXTENDED_RELEASE_TABLET | Freq: Once | ORAL | Status: DC

## 2022-12-09 MED ORDER — POTASSIUM CHLORIDE 10 MEQ/100ML IV SOLN
10.0000 meq | INTRAVENOUS | Status: AC
  Administered 2022-12-09 (×2): 10 meq via INTRAVENOUS
  Filled 2022-12-09 (×3): qty 100

## 2022-12-09 MED ORDER — BUSPIRONE HCL 10 MG PO TABS
5.0000 mg | ORAL_TABLET | Freq: Two times a day (BID) | ORAL | Status: DC
  Administered 2022-12-09: 5 mg
  Filled 2022-12-09: qty 1

## 2022-12-09 MED ORDER — POTASSIUM CHLORIDE 20 MEQ PO PACK
40.0000 meq | PACK | Freq: Once | ORAL | Status: AC
  Administered 2022-12-09: 40 meq
  Filled 2022-12-09: qty 2

## 2022-12-09 MED ORDER — POTASSIUM & SODIUM PHOSPHATES 280-160-250 MG PO PACK
2.0000 | PACK | Freq: Once | ORAL | Status: DC
  Filled 2022-12-09: qty 2

## 2022-12-09 MED ORDER — CLONAZEPAM 0.25 MG PO TBDP: 0.2500 mg | ORAL_TABLET | Freq: Two times a day (BID) | ORAL | Status: DC | PRN

## 2022-12-09 MED ORDER — POTASSIUM & SODIUM PHOSPHATES 280-160-250 MG PO PACK
2.0000 | PACK | Freq: Once | ORAL | Status: AC
  Administered 2022-12-09: 2

## 2022-12-09 MED ORDER — CLONAZEPAM 0.25 MG PO TBDP
0.2500 mg | ORAL_TABLET | Freq: Two times a day (BID) | ORAL | Status: DC | PRN
  Administered 2022-12-09: 0.5 mg
  Administered 2022-12-18: 0.25 mg
  Administered 2022-12-18 – 2022-12-19 (×2): 0.5 mg
  Administered 2022-12-20: 0.25 mg
  Administered 2022-12-21: 0.5 mg
  Filled 2022-12-09 (×2): qty 2
  Filled 2022-12-09 (×2): qty 1
  Filled 2022-12-09: qty 2
  Filled 2022-12-09: qty 1
  Filled 2022-12-09: qty 2

## 2022-12-09 NOTE — Progress Notes (Signed)
Pharmacy Antibiotic Note  Alexandra Foster is a 72 y.o. female admitted on 12/05/2022 with bacteremia.  Bcx 4 of 4 GPC, BCID showing MRSA and Staph epidermidis. Patient started on CRRT 8/24 - stopped 8/25. Pharmacy has been consulted for vancomycin dosing and monitoring.   Plan:  Discontinue scheduled vancomycin. CRRT discontinued. Plan is for line holiday. Continue to monitor plan per nephrology. 12/08/22 1130 vancomycin random = 22 12/09/22 Give vancomycin 750 mg IV x 1  Height: 5\' 4"  (162.6 cm) Weight: 90.5 kg (199 lb 8.3 oz) IBW/kg (Calculated) : 54.7  Temp (24hrs), Avg:98 F (36.7 C), Min:97.5 F (36.4 C), Max:98.3 F (36.8 C)  Recent Labs  Lab 12/05/22 2056 12/06/22 0016 12/06/22 0354 12/06/22 0404 12/06/22 0734 12/06/22 0735 12/06/22 1257 12/06/22 1614 12/07/22 0213 12/07/22 1608 12/08/22 0525 12/08/22 0613 12/08/22 1130 12/09/22 0417  WBC 21.4*  --   --  16.0* 16.1*  --   --   --  22.5*  --  34.6* 34.5*  --  33.7*  CREATININE 4.53*  --    < > 4.23*  --   --   --    < > 1.97* 1.82* 2.38* 2.57*  --  2.88*  LATICACIDVEN 2.3* 2.9*  --  2.0*  --  1.9 1.7  --   --   --   --   --   --   --   VANCORANDOM  --   --   --   --   --   --   --   --   --   --   --   --  22  --    < > = values in this interval not displayed.    Estimated Creatinine Clearance: 18.7 mL/min (A) (by C-G formula based on SCr of 2.88 mg/dL (H)).    No Known Allergies  Antimicrobials this admission: 8/23 Azithromycin / CRO x 1 8/24 Cefepime >> 8/26 8/24 Vancomycin >>   Microbiology results: 8/23 BCx: 4/4 MRSA 8/23 Cath tip: MRSA 8/24 MRSA PCR: Positive   Thank you for allowing pharmacy to be a part of this patient's care.  Tressie Ellis 12/09/2022 11:23 AM

## 2022-12-09 NOTE — Progress Notes (Signed)
PHARMACY CONSULT NOTE  Pharmacy Consult for Electrolyte Monitoring and Replacement   Recent Labs: Potassium (mmol/L)  Date Value  12/09/2022 3.0 (L)  08/11/2013 3.3 (L)   Magnesium (mg/dL)  Date Value  69/62/9528 2.4   Calcium (mg/dL)  Date Value  41/32/4401 8.4 (L)   Calcium, Total (mg/dL)  Date Value  02/72/5366 9.2   Albumin (g/dL)  Date Value  44/06/4740 2.1 (L)  08/11/2013 3.1 (L)   Phosphorus (mg/dL)  Date Value  59/56/3875 1.1 (L)   Sodium (mmol/L)  Date Value  12/09/2022 132 (L)  08/11/2013 138    Assessment: 72 y.o. female with medical history significant for asthma, COPD, type diabetes mellitus, s/p G-tube, GERD, ESRD on HD, hypertension, osteoarthritis, paroxysmal atrial fibrillation and OSA on CPAP, who presented to the emergency room with acute onset of altered mental status with lethargy and decreased responsiveness now on CRRT.  CRRT stopped 8/25. Plan for line holiday. Patient is on tube feeds and having diarrhea.  Goal of Therapy:  Electrolytes WNL  Plan:  --Na 132, stable. Continue to monitor --K 2.9 >> 3 (s/p Kcl 10 mEq IV x 2), give Kcl 40 mEq PO x 1 --Phos 1.7 >> 1.1 (s/p Phos-Nak 2 packets per tube x 1), give Phos-Nak 2 packets per tube x 1 again --Re-check K+ and phos at 1800, all other labs again tomorrow AM  Tressie Ellis 12/09/2022 2:26 PM

## 2022-12-09 NOTE — Progress Notes (Signed)
[facility]  ROUNDING NOTE   Subjective:   Patient remains confused. Trialysis catheter has been removed.   Weaned down on norepinephrine gtt. Now through peripheral line.   PRBC transfusion yesterday.   UOP  Wbc 33.7 (34.5)  Potassium and phosphorus replaced this morning.   Repeat blood cultures on 8/25 negative for growth for 2 days.   Objective:  Vital signs in last 24 hours:  Temp:  [97.5 F (36.4 C)-98.3 F (36.8 C)] 98.1 F (36.7 C) (08/27 1200) Pulse Rate:  [62-79] 68 (08/27 1300) Resp:  [13-38] 21 (08/27 1300) BP: (66-122)/(29-91) 105/51 (08/27 1300) SpO2:  [96 %-100 %] 98 % (08/27 1300) Weight:  [90.5 kg] 90.5 kg (08/27 0430)  Weight change: 0.2 kg Filed Weights   12/07/22 0500 12/08/22 0233 12/09/22 0430  Weight: 87.1 kg 90.3 kg 90.5 kg    Intake/Output: I/O last 3 completed shifts: In: 2557 [I.V.:585.4; Blood:358; Other:250; NG/GT:844.7; IV Piggyback:518.9] Out: 800 [Urine:800]   Intake/Output this shift:  Total I/O In: 703.9 [I.V.:99.6; Other:210; NG/GT:290; IV Piggyback:104.4] Out: 200 [Urine:200]  Physical Exam: General: Critically ill  Head: Normocephalic, atraumatic. Moist oral mucosal membranes  Eyes: Anicteric  Lungs:  Diminished bilaterally   Heart: regular   Abdomen:  Soft, nontender  Extremities:  1+ peripheral edema.  Neurologic: Alert to self and place, not to situation or time   Skin: No lesions  Access:  none    Basic Metabolic Panel: Recent Labs  Lab 12/07/22 0213 12/07/22 1608 12/07/22 1843 12/08/22 0525 12/08/22 0613 12/09/22 0417 12/09/22 1256  NA 132* 132*  --  130* 132* 132*  --   K 3.2* 3.9  --  3.6 3.7 2.9* 3.0*  CL 103 101  --  100 102 102  --   CO2 20* 21*  --  21* 20* 20*  --   GLUCOSE 149* 211*  --  178* 184* 191*  --   BUN 32* 26*  --  41* 38* 57*  --   CREATININE 1.97* 1.82*  --  2.38* 2.57* 2.88*  --   CALCIUM 8.7* 7.7*  --  8.2* 8.2* 8.4*  --   MG 1.9  --  2.2 2.3 2.2 2.4  --    PHOS 2.5 1.8* 1.8* 1.7*  1.6* 1.7* 1.7* 1.1*    Liver Function Tests: Recent Labs  Lab 12/05/22 2056 12/06/22 0354 12/06/22 1614 12/07/22 0213 12/07/22 1608 12/08/22 0525 12/09/22 0417  AST 66*  --   --   --   --   --   --   ALT 87*  --   --   --   --   --   --   ALKPHOS 157*  --   --   --   --   --   --   BILITOT 0.6  --   --   --   --   --   --   PROT 8.6*  --   --   --   --   --   --   ALBUMIN 3.3*   < > 2.3* 1.9* 1.8* 2.0* 2.1*   < > = values in this interval not displayed.   No results for input(s): "LIPASE", "AMYLASE" in the last 168 hours. No results for input(s): "AMMONIA" in the last 168 hours.  CBC: Recent Labs  Lab 12/05/22 2056 12/06/22 0404 12/06/22 0734 12/06/22 1934 12/07/22 0213 12/08/22 0525 12/08/22 1610 12/08/22 1656 12/08/22 2217 12/09/22 0417  WBC 21.4*   < >  16.1*  --  22.5* 34.6* 34.5*  --   --  33.7*  NEUTROABS 19.4*  --   --   --   --   --   --   --   --   --   HGB 10.2*   < > 8.6*   < > 8.8* 6.7* 6.7* 7.7* 7.6* 7.4*  HCT 33.4*   < > 28.1*   < > 28.3* 21.4* 21.7* 23.6* 23.2* 22.7*  MCV 84.3   < > 85.4  --  83.0 82.6 83.8  --   --  83.5  PLT 411*   < > 377  --  293 242 256  --   --  213   < > = values in this interval not displayed.    Cardiac Enzymes: No results for input(s): "CKTOTAL", "CKMB", "CKMBINDEX", "TROPONINI" in the last 168 hours.  BNP: Invalid input(s): "POCBNP"  CBG: Recent Labs  Lab 12/08/22 1154 12/08/22 1533 12/08/22 1953 12/09/22 0006 12/09/22 0341  GLUCAP 168* 127* 145* 175* 175*    Microbiology: Results for orders placed or performed during the hospital encounter of 12/05/22  Culture, blood (Routine x 2)     Status: Abnormal   Collection Time: 12/05/22  8:56 PM   Specimen: BLOOD  Result Value Ref Range Status   Specimen Description   Final    BLOOD BLOOD RIGHT ARM Performed at [facility], [address] [place], [place] [address]    Special Requests   Final    BOTTLES DRAWN AEROBIC  AND ANAEROBIC Blood Culture adequate volume Performed at [facility], [address], [place], [place] [address]    Culture  Setup Time   Final    GRAM POSITIVE COCCI IN BOTH AEROBIC AND ANAEROBIC BOTTLES CRITICAL RESULT CALLED TO, READ BACK BY AND VERIFIED WITH: SHEEMA HALLLAJI AT 1206 12/06/22.PMF Performed at [facility], [address] [place], [place] [address]    Culture (A)  Final    STAPHYLOCOCCUS AUREUS SUSCEPTIBILITIES PERFORMED ON PREVIOUS CULTURE WITHIN THE LAST 5 DAYS. Performed at [facility], [address] [place], [place] [address]    Report Status 12/08/2022 FINAL  Final  Culture, blood (Routine x 2)     Status: Abnormal   Collection Time: 12/05/22  8:57 PM   Specimen: BLOOD  Result Value Ref Range Status   Specimen Description   Final    BLOOD BLOOD LEFT ARM Performed at [facility], [address], [place], [place] [address]    Special Requests   Final    BOTTLES DRAWN AEROBIC AND ANAEROBIC Blood Culture adequate volume Performed at [facility], [address] [place], [place] [address]    Culture  Setup Time   Final    GRAM POSITIVE COCCI IN BOTH AEROBIC AND ANAEROBIC BOTTLES CRITICAL RESULT CALLED TO, READ BACK BY AND VERIFIED WITH: SHEEMA HALLAJI AT 1206 12/06/22.PMF Performed at [facility], [address], [place], [place] [address]    Culture METHICILLIN RESISTANT STAPHYLOCOCCUS AUREUS (A)  Final   Report Status 12/08/2022 FINAL  Final   Organism ID, Bacteria METHICILLIN RESISTANT STAPHYLOCOCCUS AUREUS  Final      Susceptibility   Methicillin resistant staphylococcus aureus - MIC*    CIPROFLOXACIN >=8 RESISTANT Resistant     ERYTHROMYCIN >=8 RESISTANT Resistant     GENTAMICIN <=0.5 SENSITIVE Sensitive     OXACILLIN >=4 RESISTANT Resistant     TETRACYCLINE <=1 SENSITIVE Sensitive  VANCOMYCIN <=0.5 SENSITIVE Sensitive     TRIMETH/SULFA >=320 RESISTANT Resistant     CLINDAMYCIN <=0.25  SENSITIVE Sensitive     RIFAMPIN <=0.5 SENSITIVE Sensitive     Inducible Clindamycin NEGATIVE Sensitive     LINEZOLID 2 SENSITIVE Sensitive     * METHICILLIN RESISTANT STAPHYLOCOCCUS AUREUS  Blood Culture ID Panel (Reflexed)     Status: Abnormal   Collection Time: 12/05/22  8:57 PM  Result Value Ref Range Status   Enterococcus faecalis NOT DETECTED NOT DETECTED Final   Enterococcus Faecium NOT DETECTED NOT DETECTED Final   Listeria monocytogenes NOT DETECTED NOT DETECTED Final   Staphylococcus species DETECTED (A) NOT DETECTED Final    Comment: CRITICAL RESULT CALLED TO, READ BACK BY AND VERIFIED WITH: SHEEMA HALLAJI AT 1206 12/06/22.PMF    Staphylococcus aureus (BCID) DETECTED (A) NOT DETECTED Final    Comment: Methicillin (oxacillin)-resistant Staphylococcus aureus (MRSA). MRSA is predictably resistant to beta-lactam antibiotics (except ceftaroline). Preferred therapy is vancomycin unless clinically contraindicated. Patient requires contact precautions if  hospitalized. CRITICAL RESULT CALLED TO, READ BACK BY AND VERIFIED WITH: SHEEMA HALLAJI AT 1206 12/06/22.PMF    Staphylococcus epidermidis DETECTED (A) NOT DETECTED Final    Comment: CRITICAL RESULT CALLED TO, READ BACK BY AND VERIFIED WITH: SHEEMA HALLAJI AT 1206 12/06/22.PMF    Staphylococcus lugdunensis NOT DETECTED NOT DETECTED Final   Streptococcus species NOT DETECTED NOT DETECTED Final   Streptococcus agalactiae NOT DETECTED NOT DETECTED Final   Streptococcus pneumoniae NOT DETECTED NOT DETECTED Final   Streptococcus pyogenes NOT DETECTED NOT DETECTED Final   A.calcoaceticus-baumannii NOT DETECTED NOT DETECTED Final   Bacteroides fragilis NOT DETECTED NOT DETECTED Final   Enterobacterales NOT DETECTED NOT DETECTED Final   Enterobacter cloacae complex NOT DETECTED NOT DETECTED Final   Escherichia coli NOT DETECTED NOT DETECTED Final   Klebsiella aerogenes NOT DETECTED NOT DETECTED Final   Klebsiella oxytoca NOT DETECTED NOT  DETECTED Final   Klebsiella pneumoniae NOT DETECTED NOT DETECTED Final   Proteus species NOT DETECTED NOT DETECTED Final   Salmonella species NOT DETECTED NOT DETECTED Final   Serratia marcescens NOT DETECTED NOT DETECTED Final   Haemophilus influenzae NOT DETECTED NOT DETECTED Final   Neisseria meningitidis NOT DETECTED NOT DETECTED Final   Pseudomonas aeruginosa NOT DETECTED NOT DETECTED Final   Stenotrophomonas maltophilia NOT DETECTED NOT DETECTED Final   Candida albicans NOT DETECTED NOT DETECTED Final   Candida auris NOT DETECTED NOT DETECTED Final   Candida glabrata NOT DETECTED NOT DETECTED Final   Candida krusei NOT DETECTED NOT DETECTED Final   Candida parapsilosis NOT DETECTED NOT DETECTED Final   Candida tropicalis NOT DETECTED NOT DETECTED Final   Cryptococcus neoformans/gattii NOT DETECTED NOT DETECTED Final   Methicillin resistance mecA/C DETECTED (A) NOT DETECTED Final    Comment: CRITICAL RESULT CALLED TO, READ BACK BY AND VERIFIED WITH: SHEEMA HALLAJI AT 1206 12/06/22.PMF    Meth resistant mecA/C and MREJ DETECTED (A) NOT DETECTED Final    Comment: CRITICAL RESULT CALLED TO, READ BACK BY AND VERIFIED WITH: SHEEMA HALLAJI AT 1206 12/06/22.PMF Performed at [facility], [address] [place], [place] [address]   Resp panel by RT-PCR (RSV, Flu A&B, Covid) Anterior Nasal Swab     Status: None   Collection Time: 12/05/22  9:53 PM   Specimen: Anterior Nasal Swab  Result Value Ref Range Status   SARS Coronavirus 2 by RT PCR NEGATIVE NEGATIVE Final    Comment: (NOTE) SARS-CoV-2 target nucleic acids are NOT  DETECTED.  The SARS-CoV-2 RNA is generally detectable in upper respiratory specimens during the acute phase of infection. The lowest concentration of SARS-CoV-2 viral copies this assay can detect is 138 copies/mL. A negative result does not preclude SARS-Cov-2 infection and should not be used as the sole basis for treatment or other patient management  decisions. A negative result may occur with  improper specimen collection/handling, submission of specimen other than nasopharyngeal swab, presence of viral mutation(s) within the areas targeted by this assay, and inadequate number of viral copies(<138 copies/mL). A negative result must be combined with clinical observations, patient history, and epidemiological information. The expected result is Negative.  Fact Sheet for Patients:  BloggerCourse.com  Fact Sheet for Healthcare Providers:  SeriousBroker.it  This test is no t yet approved or cleared by the [place] FDA and  has been authorized for detection and/or diagnosis of SARS-CoV-2 by FDA under an Emergency Use Authorization (EUA). This EUA will remain  in effect (meaning this test can be used) for the duration of the COVID-19 declaration under Section 564(b)(1) of the Act, 21 U.S.C.section 360bbb-3(b)(1), unless the authorization is terminated  or revoked sooner.       Influenza A by PCR NEGATIVE NEGATIVE Final   Influenza B by PCR NEGATIVE NEGATIVE Final    Comment: (NOTE) The Xpert Xpress SARS-CoV-2/FLU/RSV plus assay is intended as an aid in the diagnosis of influenza from Nasopharyngeal swab specimens and should not be used as a sole basis for treatment. Nasal washings and aspirates are unacceptable for Xpert Xpress SARS-CoV-2/FLU/RSV testing.  Fact Sheet for Patients: BloggerCourse.com  Fact Sheet for Healthcare Providers: SeriousBroker.it  This test is not yet approved or cleared by the [place] FDA and has been authorized for detection and/or diagnosis of SARS-CoV-2 by FDA under an Emergency Use Authorization (EUA). This EUA will remain in effect (meaning this test can be used) for the duration of the COVID-19 declaration under Section 564(b)(1) of the Act, 21 U.S.C. section 360bbb-3(b)(1), unless the  authorization is terminated or revoked.     Resp Syncytial Virus by PCR NEGATIVE NEGATIVE Final    Comment: (NOTE) Fact Sheet for Patients: BloggerCourse.com  Fact Sheet for Healthcare Providers: SeriousBroker.it  This test is not yet approved or cleared by the [place] FDA and has been authorized for detection and/or diagnosis of SARS-CoV-2 by FDA under an Emergency Use Authorization (EUA). This EUA will remain in effect (meaning this test can be used) for the duration of the COVID-19 declaration under Section 564(b)(1) of the Act, 21 U.S.C. section 360bbb-3(b)(1), unless the authorization is terminated or revoked.  Performed at [facility], [address] [place], [place] [address]   MRSA Next Gen by PCR, Nasal     Status: Abnormal   Collection Time: 12/06/22  4:27 AM   Specimen: Nasal Mucosa; Nasal Swab  Result Value Ref Range Status   MRSA by PCR Next Gen DETECTED (A) NOT DETECTED Final    Comment: RESULT CALLED TO, READ BACK BY AND VERIFIED WITH: KRISTINE CHAMBERS AT 1914 12/06/22.PMF (NOTE) The GeneXpert MRSA Assay (FDA approved for NASAL specimens only), is one component of a comprehensive MRSA colonization surveillance program. It is not intended to diagnose MRSA infection nor to guide or monitor treatment for MRSA infections. Test performance is not FDA approved in patients less than 48 years old. Performed at [facility], [address] [place], [place] [address]   Cath Tip Culture     Status: Abnormal  Collection Time: 12/06/22  2:22 PM   Specimen: Catheter Tip; Other  Result Value Ref Range Status   Specimen Description   Final    CATH TIP Performed at [facility], [address] [place], [place] [address]    Special Requests   Final    NONE Performed at [facility], [address] [place], [place] [address]    Culture (A)  Final    >=100,000  COLONIES/mL METHICILLIN RESISTANT STAPHYLOCOCCUS AUREUS   Report Status 12/08/2022 FINAL  Final   Organism ID, Bacteria METHICILLIN RESISTANT STAPHYLOCOCCUS AUREUS (A)  Final      Susceptibility   Methicillin resistant staphylococcus aureus - MIC*    CIPROFLOXACIN >=8 RESISTANT Resistant     ERYTHROMYCIN >=8 RESISTANT Resistant     GENTAMICIN <=0.5 SENSITIVE Sensitive     OXACILLIN >=4 RESISTANT Resistant     TETRACYCLINE <=1 SENSITIVE Sensitive     VANCOMYCIN <=0.5 SENSITIVE Sensitive     TRIMETH/SULFA >=320 RESISTANT Resistant     CLINDAMYCIN <=0.25 SENSITIVE Sensitive     RIFAMPIN <=0.5 SENSITIVE Sensitive     Inducible Clindamycin NEGATIVE Sensitive     LINEZOLID 2 SENSITIVE Sensitive     * >=100,000 COLONIES/mL METHICILLIN RESISTANT STAPHYLOCOCCUS AUREUS  Urine Culture (for pregnant, neutropenic or urologic patients or patients with an indwelling urinary catheter)     Status: Abnormal   Collection Time: 12/06/22  5:41 PM   Specimen: Urine, Clean Catch  Result Value Ref Range Status   Specimen Description   Final    URINE, CLEAN CATCH Performed at [facility], [address], [place], [place] [address]    Special Requests   Final    Normal Performed at [facility], [address] [place], [place] [address]    Culture (A)  Final    70,000 COLONIES/mL ENTEROCOCCUS FAECALIS 70,000 COLONIES/mL YEAST    Report Status 12/09/2022 FINAL  Final   Organism ID, Bacteria ENTEROCOCCUS FAECALIS (A)  Final      Susceptibility   Enterococcus faecalis - MIC*    AMPICILLIN <=2 SENSITIVE Sensitive     NITROFURANTOIN <=16 SENSITIVE Sensitive     VANCOMYCIN 1 SENSITIVE Sensitive     * 70,000 COLONIES/mL ENTEROCOCCUS FAECALIS  Culture, blood (Routine X 2) w Reflex to ID Panel     Status: None (Preliminary result)   Collection Time: 12/07/22  1:31 PM   Specimen: BLOOD  Result Value Ref Range Status   Specimen Description BLOOD BLOOD RIGHT HAND  Final   Special  Requests   Final    BOTTLES DRAWN AEROBIC ONLY Blood Culture adequate volume   Culture   Final    NO GROWTH 2 DAYS Performed at [facility], [address] [place], [place] [address]    Report Status PENDING  Incomplete  Culture, blood (Routine X 2) w Reflex to ID Panel     Status: None (Preliminary result)   Collection Time: 12/07/22  6:41 PM   Specimen: BLOOD  Result Value Ref Range Status   Specimen Description BLOOD BLOOD RIGHT HAND  Final   Special Requests   Final    BOTTLES DRAWN AEROBIC AND ANAEROBIC Blood Culture results may not be optimal due to an inadequate volume of blood received in culture bottles   Culture   Final    NO GROWTH 2 DAYS Performed at [facility], [address] [place], [place] [address]    Report Status PENDING  Incomplete  Coagulation Studies: No results for input(s): "LABPROT", "INR" in the last 72 hours.   Urinalysis: No results for input(s): "COLORURINE", "LABSPEC", "PHURINE", "GLUCOSEU", "HGBUR", "BILIRUBINUR", "KETONESUR", "PROTEINUR", "UROBILINOGEN", "NITRITE", "LEUKOCYTESUR" in the last 72 hours.  Invalid input(s): "APPERANCEUR"     Imaging: US Carotid Bilateral  Result Date: 12/08/2022 CLINICAL DATA:  TIA Hypertension Diabetes Former tobacco user EXAM: BILATERAL CAROTID DUPLEX ULTRASOUND TECHNIQUE: Wallace Cullens scale imaging, color Doppler and duplex ultrasound were performed of bilateral carotid and vertebral arteries in the neck. COMPARISON:  08/03/2013 FINDINGS: Criteria: Quantification of carotid stenosis is based on velocity parameters that correlate the residual internal carotid diameter with NASCET-based stenosis levels, using the diameter of the distal internal carotid lumen as the denominator for stenosis measurement. The following velocity measurements were obtained: RIGHT ICA: 75/11 cm/sec CCA: 94/16 cm/sec SYSTOLIC ICA/CCA RATIO:  0.8 ECA: 135 cm/sec LEFT ICA: 90/17 cm/sec CCA: 76/15 cm/sec SYSTOLIC ICA/CCA RATIO:   1.2 ECA: 60 cm/sec RIGHT CAROTID ARTERY: No significant atheromatous plaque. RIGHT VERTEBRAL ARTERY:  Antegrade flow. LEFT CAROTID ARTERY:  No significant atheromatous plaque. LEFT VERTEBRAL ARTERY:  Antegrade flow. IMPRESSION: No significant stenosis of internal carotid arteries. Electronically Signed   By: Acquanetta Belling M.D.   On: 12/08/2022 16:55     Medications:    sodium chloride Stopped (12/07/22 0210)   sodium chloride Stopped (12/08/22 1823)   norepinephrine (LEVOPHED) Adult infusion 6 mcg/min (12/09/22 1200)    amiodarone  200 mg Per Tube BID   ascorbic acid  500 mg Per Tube BID   atorvastatin  40 mg Per Tube Daily   busPIRone  5 mg Per Tube BID   chlorhexidine  15 mL Mouth Rinse BID   Chlorhexidine Gluconate Cloth  6 each Topical Q0600   vitamin D3  2,000 Units Per Tube Daily   escitalopram  10 mg Per Tube Daily   famotidine  10 mg Per Tube Daily   feeding supplement (NEPRO CARB STEADY)  1,000 mL Per Tube Q24H   feeding supplement (PROSource TF20)  60 mL Per Tube Daily   free water  30 mL Per Tube Q4H   Gerhardt's butt cream   Topical BID   heparin injection (subcutaneous)  5,000 Units Subcutaneous Q8H   hydrocortisone sod succinate (SOLU-CORTEF) inj  50 mg Intravenous Q6H   hydrOXYzine  50 mg Per Tube Q6H   insulin aspart  0-6 Units Subcutaneous Q4H   midodrine  15 mg Per Tube TID WC   multivitamin  1 tablet Per Tube QHS   mupirocin ointment   Nasal BID   nutrition supplement (JUVEN)  1 packet Per Tube BID BM   mouth rinse  15 mL Mouth Rinse 4 times per day   potassium & sodium phosphates  2 packet Oral Once   vancomycin variable dose per unstable renal function (pharmacist dosing)   Does not apply See admin instructions   sodium chloride, acetaminophen **OR** acetaminophen, artificial tears, clonazePAM, ipratropium-albuterol, loperamide HCl, magnesium hydroxide, ondansetron **OR** ondansetron (ZOFRAN) IV, mouth rinse, sennosides, traZODone  Assessment/ Plan:  Ms.  Alexandra Foster is a 72 y.o.  female with past medical history of diabetes, COPD, GERD, hypertension. OA, a fib, and end stage renal disease on hemodialysis. Patient presents to the ED from her nursing facility complaining of altered mental status. She has been admitted for Lower urinary tract infectious disease [N39.0] Altered mental status, unspecified altered mental status type [R41.82] Sepsis due to gram-negative UTI (HCC) [A41.50, N39.0] Sepsis, due to unspecified organism, unspecified whether  acute organ dysfunction present (HCC) [A41.9]  [facility]/TTS/Rt Permcath/81.3kg  End stage renal disease - no acute indication for dialysis a this time.  - holding dialysis at this time.  - would like to have a line holiday if we can wean off norepinephrine. - monitor daily for dialysis need. Anticipate restarting dialysis after a 48 hour line holiday.  - Will ask if vascular can place a tunneled catheter by the end of the week.   2. Hypotension due to sepsis. Blood cultures and cath tip cultures with MRSA. Repeat cultures with no growth  - requiring vasopressors.   - continue antibiotics.   3. Anemia of chronic kidney disease : PRBC transfusion on 8/26.   4. Secondary Hyperparathyroidism:    - replace phosphorus as needed.     LOS: 3 Kathaleen Dudziak 8/27/20242:15 PM

## 2022-12-09 NOTE — Consult Note (Addendum)
Consultation Note Date: 12/09/2022   Patient Name: Alexandra Foster  DOB: 04/20/1950  MRN: [id number]  Age / Sex: 72 y.o., female  PCP: Lum Keas Lovell Sheehan, MD Referring Physician: Vida Rigger, MD  Reason for Consultation: Establishing goals of care  HPI/Patient Profile: 72 y.o female with PMHx significant for ESRD on HD admitted with Acute Metabolic Encephalopathy in setting of Severe Sepsis with Septic Shock due to MRSA & Staph Epi Bacteremia (suspect from Right chest Permcath) along with UTI.   Clinical Assessment and Goals of Care: Notes and labs reviewed.  In to see patient.  No family at bedside.  Patient is resting in bed at this time with nursing at bedside.  She has a cough which she states is normal for her.  Patient states she is married, and has a child.  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  Patient states she lives in a facility.  She shares that she is followed by speech therapy who is still working with her on swallowing.  She is able to tell me her name, and her date of birth.  She states Danae Orleans is the president.  Nursing present to administer heparin injection, and patient voices frustration with all the care that she is receiving in ICU.  Patient states she wants to do what she is able to live longer, she just wants to be left alone to rest right now.  Upon getting up to leave, she states that she is having pain in her buttocks and wants the nurse to come in to assist her.  RN made aware.    PMT will follow-up tomorrow to try to have further conversation.   SUMMARY OF RECOMMENDATIONS   PMT will follow.       Primary Diagnoses: Present on Admission:  MRSA bacteremia   I have reviewed the medical record, interviewed the patient and family, and examined the patient. The following aspects are pertinent.  Past Medical History:   Diagnosis Date   AKI (acute kidney injury) (HCC)    a. 04/2021 in setting of bacteremia/shock.   Arthritis    Asthma    Bacteremia    a. 04/2021 S pyogenes bacteremia in setting of lower ext cellulitis.   COPD (chronic obstructive pulmonary disease) (HCC)    Diabetes mellitus without complication (HCC)    Endometriosis    GERD (gastroesophageal reflux disease)    History of echocardiogram    a. 07/2013 Echo: EF 55-60%, impaired relaxation, mild TR; b. 04/2021 Echo: EF 50-55%, mild LVH, nl RV fxn, mild BAE, Ao sclerosis w/o stenosis.   Hypertension    Obesity    PAF (paroxysmal atrial fibrillation) (HCC)    a. 04/2021 in setting of septic shock/cellulitis.   Sleep apnea    CPAP   Social History   Socioeconomic History   Marital status: Married    Spouse name: [name]   Number of children: 1   Years of education: 12   Highest education  level: 12th grade  Occupational History    Employer: RETIRED  Tobacco Use   Smoking status: Former    Current packs/day: 0.00    Average packs/day: 2.0 packs/day for 40.0 years (80.0 ttl pk-yrs)    Types: Cigarettes    Start date: 9    Quit date: 2003    Years since quitting: 21.6   Smokeless tobacco: Former    Types: Snuff    Quit date: 04/2001   Tobacco comments:    smoking cessation materials not required  Vaping Use   Vaping status: Never Used  Substance and Sexual Activity   Alcohol use: No    Alcohol/week: 0.0 standard drinks of alcohol   Drug use: No   Sexual activity: Not Currently  Other Topics Concern   Not on file  Social History Narrative   Lives locally w/ husband.  Does not routinely exercise.   Social Determinants of Health   Financial Resource Strain: Medium Risk (08/09/2020)   Overall Financial Resource Strain (CARDIA)    Difficulty of Paying Living Expenses: Somewhat hard  Food Insecurity: Patient Unable To Answer (12/06/2022)   Hunger Vital Sign    Worried About Running Out of Food in the Last Year: Patient  unable to answer    Ran Out of Food in the Last Year: Patient unable to answer  Transportation Needs: No Transportation Needs (12/06/2022)   PRAPARE - Administrator, Civil Service (Medical): No    Lack of Transportation (Non-Medical): No  Physical Activity: Inactive (08/09/2020)   Exercise Vital Sign    Days of Exercise per Week: 0 days    Minutes of Exercise per Session: 0 min  Stress: No Stress Concern Present (08/09/2020)   [employer] of Occupational Health - Occupational Stress Questionnaire    Feeling of Stress : Not at all  Social Connections: Moderately Integrated (08/09/2020)   Social Connection and Isolation Panel [NHANES]    Frequency of Communication with Friends and Family: More than three times a week    Frequency of Social Gatherings with Friends and Family: More than three times a week    Attends Religious Services: More than 4 times per year    Active Member of [employer] or Organizations: No    Attends Banker Meetings: Never    Marital Status: Married   Family History  Problem Relation Age of Onset   Congestive Heart Failure Mother    Coronary artery disease Father 62   Breast cancer Sister 39   Heart disease Brother    Varicose Veins Brother    Alcohol abuse Brother    Scheduled Meds:  amiodarone  200 mg Per Tube BID   ascorbic acid  500 mg Per Tube BID   atorvastatin  40 mg Per Tube Daily   busPIRone  5 mg Per Tube BID   chlorhexidine  15 mL Mouth Rinse BID   Chlorhexidine Gluconate Cloth  6 each Topical Q0600   vitamin D3  2,000 Units Per Tube Daily   escitalopram  10 mg Per Tube Daily   famotidine  10 mg Per Tube Daily   feeding supplement (NEPRO CARB STEADY)  1,000 mL Per Tube Q24H   feeding supplement (PROSource TF20)  60 mL Per Tube Daily   free water  30 mL Per Tube Q4H   Gerhardt's butt cream   Topical BID   heparin injection (subcutaneous)  5,000 Units Subcutaneous Q8H   hydrocortisone sod succinate (SOLU-CORTEF) inj  50  mg  Intravenous Q6H   hydrOXYzine  50 mg Per Tube Q6H   insulin aspart  0-6 Units Subcutaneous Q4H   midodrine  15 mg Per Tube TID WC   multivitamin  1 tablet Per Tube QHS   mupirocin ointment   Nasal BID   nutrition supplement (JUVEN)  1 packet Per Tube BID BM   mouth rinse  15 mL Mouth Rinse 4 times per day   vancomycin variable dose per unstable renal function (pharmacist dosing)   Does not apply See admin instructions   Continuous Infusions:  sodium chloride Stopped (12/07/22 0210)   sodium chloride Stopped (12/08/22 1823)   norepinephrine (LEVOPHED) Adult infusion 6 mcg/min (12/09/22 1500)   vancomycin     PRN Meds:.sodium chloride, acetaminophen **OR** acetaminophen, artificial tears, clonazePAM, ipratropium-albuterol, loperamide HCl, magnesium hydroxide, ondansetron **OR** ondansetron (ZOFRAN) IV, mouth rinse, sennosides, traZODone Medications Prior to Admission:  Prior to Admission medications   Medication Sig Start Date End Date Taking? Authorizing Provider  amiodarone (PACERONE) 200 MG tablet Place 1 tablet (200 mg total) into feeding tube 2 (two) times daily. 06/13/21  Yes Erin Fulling, MD  ascorbic acid (VITAMIN C) 500 MG tablet Place 1 tablet (500 mg total) into feeding tube 2 (two) times daily. 06/13/21  Yes Kasa, Wallis Bamberg, MD  busPIRone (BUSPAR) 5 MG tablet Take 5 mg by mouth 2 (two) times daily. 12/04/22  Yes [provider]  cholecalciferol (VITAMIN D) 25 MCG tablet Place 2 tablets (2,000 Units total) into feeding tube daily. 06/14/21  Yes Kasa, Wallis Bamberg, MD  clonazePAM (KLONOPIN) 0.5 MG tablet Take 0.25-0.5 mg by mouth 2 (two) times daily. 0.25 mg every morning and 0.5 mg at bedtime for Agitation 11/18/22  Yes [provider]  escitalopram (LEXAPRO) 10 MG tablet Place 1 tablet (10 mg total) into feeding tube daily. 06/14/21  Yes Erin Fulling, MD  hydrOXYzine (ATARAX) 50 MG tablet Take 100 mg by mouth 2 (two) times daily. 12/02/22  Yes [provider]  lip balm  (BLISTEX) OINT Apply 1 application topically 3 (three) times daily. 06/13/21  Yes Erin Fulling, MD  loperamide (IMODIUM) 2 MG capsule Take 1 capsule (2 mg total) by mouth as needed for diarrhea or loose stools. 06/13/21  Yes Erin Fulling, MD  midodrine (PROAMATINE) 5 MG tablet Place 3 tablets (15 mg total) into feeding tube 3 (three) times daily with meals. 06/13/21  Yes Erin Fulling, MD  multivitamin (RENA-VIT) TABS tablet Place 1 tablet into feeding tube at bedtime. 06/13/21  Yes Erin Fulling, MD  omeprazole (PRILOSEC) 20 MG capsule Take 20 mg by mouth daily. 11/12/22  Yes [provider]  sennosides (SENOKOT) 8.8 MG/5ML syrup Place 10 mLs into feeding tube at bedtime as needed for moderate constipation. 06/13/21  Yes Erin Fulling, MD  artificial tears (LACRILUBE) OINT ophthalmic ointment Place into both eyes every 4 (four) hours as needed for dry eyes. Patient not taking: Reported on 12/06/2022 06/13/21   Erin Fulling, MD  chlorhexidine gluconate, MEDLINE KIT, (PERIDEX) 0.12 % solution 15 mLs by Mouth Rinse route 2 (two) times daily. Patient not taking: Reported on 12/06/2022 06/13/21   Erin Fulling, MD  collagenase (SANTYL) ointment Apply topically daily. Patient not taking: Reported on 12/06/2022 06/14/21   Erin Fulling, MD  dextrose 5 % SOLN 100 mL with copper chloride 0.4 MG/ML SOLN 2 mg Inject 2 mg into the vein daily. 06/14/21   Erin Fulling, MD  ipratropium-albuterol (DUONEB) 0.5-2.5 (3) MG/3ML SOLN Take 3 mLs by nebulization 3 (three) times  daily. Patient not taking: Reported on 12/06/2022 06/13/21   Erin Fulling, MD  lactobacillus (FLORANEX/LACTINEX) PACK Place 1 packet (1 g total) into feeding tube 3 (three) times daily with meals. Patient not taking: Reported on 12/06/2022 06/13/21   Erin Fulling, MD  pantoprazole sodium (PROTONIX) 40 mg Place 40 mg into feeding tube daily. Patient not taking: Reported on 12/06/2022 06/13/21   Erin Fulling, MD   No Known Allergies Review of Systems  Musculoskeletal:         Pain in the buttocks.    Physical Exam Pulmonary:     Effort: Pulmonary effort is normal.  Skin:    General: Skin is warm and dry.  Neurological:     Mental Status: She is alert.     Vital Signs: BP (!) 98/45   Pulse 72   Temp 98.1 F (36.7 C) (Oral)   Resp 20   Ht 5\' 4"  (1.626 m)   Wt 90.5 kg   SpO2 100%   BMI 34.25 kg/m  Pain Scale: 0-10 POSS *See Group Information*: S-Acceptable,Sleep, easy to arouse Pain Score: 0-No pain   SpO2: SpO2: 100 % O2 Device:SpO2: 100 % O2 Flow Rate: .   IO: Intake/output summary:  Intake/Output Summary (Last 24 hours) at 12/09/2022 1530 Last data filed at 12/09/2022 1500 Gross per 24 hour  Intake 2474.56 ml  Output 700 ml  Net 1774.56 ml    Signed by: Morton Stall, NP   Please contact Palliative Medicine Team phone at [phone] for questions and concerns.  For individual provider: See Loretha Stapler

## 2022-12-09 NOTE — Progress Notes (Signed)
Patient is confused stating that we are "trying to kill her" Daughter at bedside to help orient and calm patient. Was able to administer medications but unable to remove femoral line due to agitation. Will continue to assess for when patient is more oriented. Will continue to monitor, daughter still at bedside.

## 2022-12-09 NOTE — Progress Notes (Signed)
NAME:  Alexandra Foster, MRN:  [id number], DOB:  08/14/1950, LOS: 3 ADMISSION DATE:  12/05/2022, CONSULTATION DATE:  12/06/2022 REFERRING MD:  Dr. Wynelle Link, CHIEF COMPLAINT:  Hypotension   Brief Pt Description / Synopsis:  72 y.o female with PMHx significant for ESRD on HD admitted with Acute Metabolic Encephalopathy in setting of Severe Sepsis with Septic Shock due to MRSA & Staph Epi Bacteremia (suspect from Right chest Permcath) along with UTI.  History of Present Illness:  Alexandra Foster is a 72 y.o. female with past medical history significant for asthma, COPD, type diabetes mellitus, s/p G-tube, GERD, ESRD on HD, hypertension, osteoarthritis, paroxysmal atrial fibrillation and OSA on CPAP, who presented to [facility] ED on 12/05/22 from [facility] due to altered mental status with lethargy and decreased responsiveness.  There was a concern about aspiration pneumonia at his facility as the patient had an episode of nausea and vomiting and was reportedly coughing per her sister without wheezing or dyspnea or hypoxia.  The patient's temperature was 102.2 when EMS arrived to this facility.  The patient was not responding to questions throughout transport.  EMS noted the patient having slurred speech and significant weakness per family (family had not seen her since last week).  Code stroke was called upon arrival to the ED. teleneurology evaluated the patient, was not considered a candidate for thrombolytics given unclear last known well time, along with low suspicion for stroke given significantly global symptoms and multiple metabolic derangements and sepsis contributing to encephalopathy.  12/09/22-patient with loose watery BM today, she does have skin breakdown over abd pannus.  She had re-initiation of her psychiatric medications today.  S/p pRBC transfusion overnight. Electrolytes are being repleted.  On room air but requires levophed still.   Pertinent  Medical History   Past Medical  History:  Diagnosis Date   AKI (acute kidney injury) (HCC)    a. 04/2021 in setting of bacteremia/shock.   Arthritis    Asthma    Bacteremia    a. 04/2021 S pyogenes bacteremia in setting of lower ext cellulitis.   COPD (chronic obstructive pulmonary disease) (HCC)    Diabetes mellitus without complication (HCC)    Endometriosis    GERD (gastroesophageal reflux disease)    History of echocardiogram    a. 07/2013 Echo: EF 55-60%, impaired relaxation, mild TR; b. 04/2021 Echo: EF 50-55%, mild LVH, nl RV fxn, mild BAE, Ao sclerosis w/o stenosis.   Hypertension    Obesity    PAF (paroxysmal atrial fibrillation) (HCC)    a. 04/2021 in setting of septic shock/cellulitis.   Sleep apnea    CPAP    Micro Data:  8/23: SARS-CoV-2/RSV/Flu PCR>>negative 8/23: Blood cultures>> 4/4 bottles with MRSA & Staph epidermidis 8/23: MRSA PCR>> positive 8/23: Urine>> 8/24: Permcath tip>>  Antimicrobials:   Anti-infectives (From admission, onward)    Start     Dose/Rate Route Frequency Ordered Stop   12/08/22 1040  vancomycin variable dose per unstable renal function (pharmacist dosing)         Does not apply See admin instructions 12/08/22 1041     12/07/22 1400  vancomycin (VANCOCIN) IVPB 1000 mg/200 mL premix  Status:  Discontinued        1,000 mg 200 mL/hr over 60 Minutes Intravenous Every 24 hours 12/06/22 1234 12/08/22 1041   12/06/22 2200  cefTRIAXone (ROCEPHIN) 2 g in sodium chloride 0.9 % 100 mL IVPB  Status:  Discontinued        2 g  200 mL/hr over 30 Minutes Intravenous Every 24 hours 12/06/22 0132 12/06/22 1223   12/06/22 1500  ceFEPIme (MAXIPIME) 2 g in sodium chloride 0.9 % 100 mL IVPB  Status:  Discontinued        2 g 200 mL/hr over 30 Minutes Intravenous Every 12 hours 12/06/22 1252 12/08/22 1040   12/06/22 1315  vancomycin (VANCOREADY) IVPB 1750 mg/350 mL        1,750 mg 175 mL/hr over 120 Minutes Intravenous  Once 12/06/22 1224 12/06/22 1724   12/05/22 2130  cefTRIAXone (ROCEPHIN)  2 g in sodium chloride 0.9 % 100 mL IVPB  Status:  Discontinued        2 g 200 mL/hr over 30 Minutes Intravenous Every 24 hours 12/05/22 2115 12/06/22 0202   12/05/22 2130  azithromycin (ZITHROMAX) 500 mg in sodium chloride 0.9 % 250 mL IVPB  Status:  Discontinued        500 mg 250 mL/hr over 60 Minutes Intravenous Every 24 hours 12/05/22 2115 12/06/22 1223        Significant Hospital Events: Including procedures, antibiotic start and stop dates in addition to other pertinent events   8/23: Presented from [facility] due to AMS, Code Stroke called due to slurred speech and weakness. Deemed not a candidate for thrombolytics due to no clear last known normal time.  Admitted by [facility] with Nephrology consultation for dialysis. 8/24: Rapid response called due to Hypotension prior to/during Hemodialysis.  Transfer to ICU for peripheral vasopressors and initiation of CRRT.  PCCM consulted to assist with vasopressors.  Blood cultures + for MRSA & Staph Epi. ID consulted. Vascular Surgery consulted, permcath removed.  Temporary HD catheter placed. 8/24: this morning with pronounce left facial droop and dysarthria, remains on CRRT on Levo @8  keep MAP>55 12/08/22- patient with multispecies sepsis, removing central line today due to bacteremia.  Holding HD as per nephrology team due to shock.   Interim History / Subjective:  -See significant events  Objective   Blood pressure 98/60, pulse 72, temperature 98.3 F (36.8 C), temperature source Oral, resp. rate 19, height 5\' 4"  (1.626 m), weight 90.5 kg, SpO2 100%.        Intake/Output Summary (Last 24 hours) at 12/09/2022 4098 Last data filed at 12/09/2022 1191 Gross per 24 hour  Intake 2211.47 ml  Output 500 ml  Net 1711.47 ml   Filed Weights   12/07/22 0500 12/08/22 0233 12/09/22 0430  Weight: 87.1 kg 90.3 kg 90.5 kg    Examination: General: Acute on chronically ill-appearing obese female, laying in bed, on room air, no acute  distress HENT: Atraumatic, normocephalic, neck supple, difficult to assess JVD due to body habitus Lungs: Diminished breath sounds throughout, even, nonlabored, normal effort Cardiovascular: Regular rate and rhythm, S1-S2, no murmurs, rubs, gallops Abdomen: Obese, soft, nontender, nondistended, no guarding or rebound tenderness, bowel sounds positive x 4 Extremities: Prior left AKA, 1+ edema to right lower extremity, no cyanosis Neuro: Lethargic, arouses easily to voice, oriented to person and place, moves extremities to command (left side slightly weaker, mild to moderate dysarthria, left facial droop GU: Incontinent of urine  Resolved Hospital Problem list     Assessment & Plan:   #Septic Shock- present on admission due to blood stream infection with MRSA bacteremia -Severe Sepsis due to MRSA & Staph Epi Bacteremia (suspect due to Permcath) & UTI -Monitor fever curve -Trend WBC's & Procalcitonin -Follow cultures as above Vancomycin only- for MRSA -Obtain Echocardiogram -Right chest Permcath removed  -  Consult ID, appreciate input- cultures re-drawn -on levophed 76mcg/kg/min   Atrial fibrillation  Echocardiogram 05/16/21: LVEF 60-65%, mild LVH, Grade I DD, normal RV systolic function, mildly elevated pulmonary artery systolic pressure -Continuous cardiac monitoring -Maintain MAP >65 -Cautious IV fluids due to ESRD -Vasopressors as needed to maintain MAP goal -Continue Midodrine -Trend lactic acid until normalized -Trend HS Troponin until peaked -Cortisol is 35.9 -Echocardiogram pending -Continue outpatient Amiodarone           -currently rate/rhythm controlled   #ESRD on Hemodialysis #Mild Hyponatremia -Monitor I&O's / urinary output -Follow BMP -Ensure adequate renal perfusion -Avoid nephrotoxic agents as able -Replace electrolytes as indicated ~ Pharmacy following for assistance with electrolyte replacement -Nephrology following, appreciate input: HD vs CRRT per  recommendations  #COPD without acute exacerbation #OSA, uses CPAP at home -Supplemental O2 as needed to maintain O2 sats 88 to 92% -CPAP qhs -Follow intermittent Chest X-ray & ABG as needed -Bronchodilators prn -Pulmonary toilet as able  #Anemia of chronic disease -Monitor for S/Sx of bleeding -Trend CBC -Heparin SQ for VTE Prophylaxis  -Transfuse for Hgb <7  #Type 2 diabetes mellitus -CBG's q4h; Target range of 140 to 180 -SSI -Follow ICU Hypo/Hyperglycemia protocol -Hold outpatient metformin  #Acute Metabolic Encephalopathy in setting of sepsis and multiple metabolic derangements #?CVA PMHx: Depression and Anxiety CT Head 8/23 negative for acute intracranial abnormality Code stroke called 8/23, deemed not a candidate for thrombolytics given unclear last known well time -Treatment of Sepsis and metabolic derangements as outlined above -Provide supportive care -Promote normal sleep/wake cycle and family presence -Avoid sedating medications as able -Hold Klonopin, Buspar, Lexapro for now  Left sided weakness, Left Facial droop  -CTA head and neck reviewed and shows no acute intracranial abnormality or LVO -Check HgbA1c, fasting lipid panel -Obtain MRI  of the brain without contrast -PT consult, OT consult, Speech consult -Echocardiogram -Neurology consult placed    Best Practice (right click and "Reselect all SmartList Selections" daily)   Diet/type: NPO DVT prophylaxis: prophylactic heparin  GI prophylaxis: PPI Lines: right radial A-line, left femoral Trialysis (both still needed) Foley:  N/A Code Status:  full code Last date of multidisciplinary goals of care discussion [N/A]  Labs   CBC: Recent Labs  Lab 12/05/22 2056 12/06/22 0404 12/06/22 0734 12/06/22 1934 12/07/22 0213 12/08/22 0525 12/08/22 0613 12/08/22 1656 12/08/22 2217 12/09/22 0417  WBC 21.4*   < > 16.1*  --  22.5* 34.6* 34.5*  --   --  33.7*  NEUTROABS 19.4*  --   --   --   --   --   --    --   --   --   HGB 10.2*   < > 8.6*   < > 8.8* 6.7* 6.7* 7.7* 7.6* 7.4*  HCT 33.4*   < > 28.1*   < > 28.3* 21.4* 21.7* 23.6* 23.2* 22.7*  MCV 84.3   < > 85.4  --  83.0 82.6 83.8  --   --  83.5  PLT 411*   < > 377  --  293 242 256  --   --  213   < > = values in this interval not displayed.    Basic Metabolic Panel: Recent Labs  Lab 12/07/22 0213 12/07/22 1608 12/07/22 1843 12/08/22 0525 12/08/22 0613 12/09/22 0417  NA 132* 132*  --  130* 132* 132*  K 3.2* 3.9  --  3.6 3.7 2.9*  CL 103 101  --  100 102 102  CO2 20* 21*  --  21* 20* 20*  GLUCOSE 149* 211*  --  178* 184* 191*  BUN 32* 26*  --  41* 38* 57*  CREATININE 1.97* 1.82*  --  2.38* 2.57* 2.88*  CALCIUM 8.7* 7.7*  --  8.2* 8.2* 8.4*  MG 1.9  --  2.2 2.3 2.2 2.4  PHOS 2.5 1.8* 1.8* 1.7*  1.6* 1.7* 1.7*   GFR: Estimated Creatinine Clearance: 18.7 mL/min (A) (by C-G formula based on SCr of 2.88 mg/dL (H)). Recent Labs  Lab 12/06/22 0016 12/06/22 0404 12/06/22 0734 12/06/22 0735 12/06/22 1257 12/07/22 0213 12/08/22 0525 12/08/22 0613 12/09/22 0417  PROCALCITON  --  2.01  --   --   --  1.51 4.43  --   --   WBC  --  16.0*   < >  --   --  22.5* 34.6* 34.5* 33.7*  LATICACIDVEN 2.9* 2.0*  --  1.9 1.7  --   --   --   --    < > = values in this interval not displayed.    Liver Function Tests: Recent Labs  Lab 12/05/22 2056 12/06/22 0354 12/06/22 1614 12/07/22 0213 12/07/22 1608 12/08/22 0525 12/09/22 0417  AST 66*  --   --   --   --   --   --   ALT 87*  --   --   --   --   --   --   ALKPHOS 157*  --   --   --   --   --   --   BILITOT 0.6  --   --   --   --   --   --   PROT 8.6*  --   --   --   --   --   --   ALBUMIN 3.3*   < > 2.3* 1.9* 1.8* 2.0* 2.1*   < > = values in this interval not displayed.   No results for input(s): "LIPASE", "AMYLASE" in the last 168 hours. No results for input(s): "AMMONIA" in the last 168 hours.  ABG    Component Value Date/Time   PHART 7.40 05/14/2021 0341   PCO2ART 45  05/14/2021 0341   PO2ART 129 (H) 05/14/2021 0341   HCO3 27.9 05/14/2021 0341   ACIDBASEDEF 12.8 (H) 05/11/2021 0603   O2SAT 98.9 05/14/2021 0341     Coagulation Profile: Recent Labs  Lab 12/05/22 2056 12/06/22 0404  INR 1.1 1.2    Cardiac Enzymes: No results for input(s): "CKTOTAL", "CKMB", "CKMBINDEX", "TROPONINI" in the last 168 hours.  HbA1C: Hgb A1c MFr Bld  Date/Time Value Ref Range Status  12/07/2022 02:13 AM 5.2 4.8 - 5.6 % Final    Comment:    (NOTE) Pre diabetes:          5.7%-6.4%  Diabetes:              >6.4%  Glycemic control for   <7.0% adults with diabetes   05/05/2021 04:53 AM 6.7 (H) 4.8 - 5.6 % Final    Comment:    (NOTE)         Prediabetes: 5.7 - 6.4         Diabetes: >6.4         Glycemic control for adults with diabetes: <7.0     CBG: Recent Labs  Lab 12/08/22 1154 12/08/22 1533 12/08/22 1953 12/09/22 0006 12/09/22 0341  GLUCAP 168* 127* 145* 175* 175*    Review of Systems:  Unable to assess due to AMS  Past Medical History:  She,  has a past medical history of AKI (acute kidney injury) (HCC), Arthritis, Asthma, Bacteremia, COPD (chronic obstructive pulmonary disease) (HCC), Diabetes mellitus without complication (HCC), Endometriosis, GERD (gastroesophageal reflux disease), History of echocardiogram, Hypertension, Obesity, PAF (paroxysmal atrial fibrillation) (HCC), and Sleep apnea.   Surgical History:   Past Surgical History:  Procedure Laterality Date   ABDOMINAL HYSTERECTOMY     APPLICATION OF WOUND VAC Left 05/17/2021   Procedure: APPLICATION OF WOUND VAC/WOUND VAC EXCHANGE-Matrix Myriad;  Surgeon: Carolan Shiver, MD;  Location: [facility] OR[facility];  Service: General;  Laterality: Left;   APPLICATION OF WOUND VAC  05/24/2021   Procedure: APPLICATION OF WOUND VAC;  Surgeon: Carolan Shiver, MD;  Location: [facility] OR[facility];  Service: General;;   APPLICATION OF WOUND VAC  05/10/2021   Procedure: APPLICATION OF WOUND VAC;  Surgeon:  Carolan Shiver, MD;  Location: [facility];  Service: General;;   COLONOSCOPY  10/29/2006   Dr Servando Snare   COLONOSCOPY WITH PROPOFOL N/A 11/05/2016   Procedure: COLONOSCOPY WITH PROPOFOL;  Surgeon: Earline Mayotte, MD;  Location: [facility];  Service: Endoscopy;  Laterality: N/A;   INCISION AND DRAINAGE OF WOUND Left 05/24/2021   Procedure: IRRIGATION AND DEBRIDEMENT LEFT LEG;  Surgeon: Carolan Shiver, MD;  Location: [facility];  Service: General;  Laterality: Left;   INCISION AND DRAINAGE OF WOUND Left 05/10/2021   Procedure: IRRIGATION AND DEBRIDEMENT LEFT LEG;  Surgeon: Carolan Shiver, MD;  Location: [facility];  Service: General;  Laterality: Left;   IR FLUORO GUIDE CV LINE RIGHT  06/12/2021   IR PERC TUN PERIT CATH WO PORT S&I /IMAG  06/12/2021   IR REPLC GASTRO/COLONIC TUBE PERCUT W/FLUORO  06/12/2021   IVC FILTER INSERTION N/A 05/29/2021   Procedure: IVC FILTER INSERTION;  Surgeon: Renford Dills, MD;  Location: [facility];  Service: Cardiovascular;  Laterality: N/A;   MINOR GRAFT APPLICATION  05/24/2021   Procedure: Myriad Matrix  APPLICATION;  Surgeon: Carolan Shiver, MD;  Location: [facility];  Service: General;;   NASAL SINUS SURGERY  2002   Dr Chestine Spore   PEG PLACEMENT N/A 05/28/2021   Procedure: PERCUTANEOUS ENDOSCOPIC GASTROSTOMY (PEG) PLACEMENT;  Surgeon: Sung Amabile, DO;  Location: [facility];  Service: General;  Laterality: N/A;  TRAVEL CASE   TRACHEOSTOMY TUBE PLACEMENT N/A 05/17/2021   Procedure: TRACHEOSTOMY;  Surgeon: Geanie Logan, MD;  Location: [facility];  Service: ENT;  Laterality: N/A;   WOUND DEBRIDEMENT Left 05/07/2021   Procedure: DEBRIDEMENT WOUND;  Surgeon: Carolan Shiver, MD;  Location: [facility];  Service: General;  Laterality: Left;     Social History:   reports that she quit smoking about 21 years ago. Her smoking use included cigarettes. She started smoking about 61 years ago. She has a 80 pack-year smoking history. She quit  smokeless tobacco use about 21 years ago.  Her smokeless tobacco use included snuff. She reports that she does not drink alcohol and does not use drugs.   Family History:  Her family history includes Alcohol abuse in her brother; Breast cancer (age of onset: 45) in her sister; Congestive Heart Failure in her mother; Coronary artery disease (age of onset: 38) in her father; Heart disease in her brother; Varicose Veins in her brother.   Allergies No Known Allergies   Home Medications  Prior to Admission medications   Medication Sig Start Date End Date Taking? Authorizing Provider  amiodarone (PACERONE) 200 MG tablet Place 1 tablet (  200 mg total) into feeding tube 2 (two) times daily. 06/13/21  Yes Erin Fulling, MD  ascorbic acid (VITAMIN C) 500 MG tablet Place 1 tablet (500 mg total) into feeding tube 2 (two) times daily. 06/13/21  Yes Kasa, Wallis Bamberg, MD  busPIRone (BUSPAR) 5 MG tablet Take 5 mg by mouth 2 (two) times daily. 12/04/22  Yes [provider]  cholecalciferol (VITAMIN D) 25 MCG tablet Place 2 tablets (2,000 Units total) into feeding tube daily. 06/14/21  Yes Kasa, Wallis Bamberg, MD  clonazePAM (KLONOPIN) 0.5 MG tablet Take 0.25-0.5 mg by mouth 2 (two) times daily. 0.25 mg every morning and 0.5 mg at bedtime for Agitation 11/18/22  Yes [provider]  escitalopram (LEXAPRO) 10 MG tablet Place 1 tablet (10 mg total) into feeding tube daily. 06/14/21  Yes Erin Fulling, MD  hydrOXYzine (ATARAX) 50 MG tablet Take 100 mg by mouth 2 (two) times daily. 12/02/22  Yes [provider]  lip balm (BLISTEX) OINT Apply 1 application topically 3 (three) times daily. 06/13/21  Yes Erin Fulling, MD  loperamide (IMODIUM) 2 MG capsule Take 1 capsule (2 mg total) by mouth as needed for diarrhea or loose stools. 06/13/21  Yes Erin Fulling, MD  midodrine (PROAMATINE) 5 MG tablet Place 3 tablets (15 mg total) into feeding tube 3 (three) times daily with meals. 06/13/21  Yes Erin Fulling, MD  multivitamin  (RENA-VIT) TABS tablet Place 1 tablet into feeding tube at bedtime. 06/13/21  Yes Erin Fulling, MD  omeprazole (PRILOSEC) 20 MG capsule Take 20 mg by mouth daily. 11/12/22  Yes [provider]  sennosides (SENOKOT) 8.8 MG/5ML syrup Place 10 mLs into feeding tube at bedtime as needed for moderate constipation. 06/13/21  Yes Erin Fulling, MD  artificial tears (LACRILUBE) OINT ophthalmic ointment Place into both eyes every 4 (four) hours as needed for dry eyes. Patient not taking: Reported on 12/06/2022 06/13/21   Erin Fulling, MD  chlorhexidine gluconate, MEDLINE KIT, (PERIDEX) 0.12 % solution 15 mLs by Mouth Rinse route 2 (two) times daily. Patient not taking: Reported on 12/06/2022 06/13/21   Erin Fulling, MD  collagenase (SANTYL) ointment Apply topically daily. Patient not taking: Reported on 12/06/2022 06/14/21   Erin Fulling, MD  dextrose 5 % SOLN 100 mL with copper chloride 0.4 MG/ML SOLN 2 mg Inject 2 mg into the vein daily. 06/14/21   Erin Fulling, MD  ipratropium-albuterol (DUONEB) 0.5-2.5 (3) MG/3ML SOLN Take 3 mLs by nebulization 3 (three) times daily. Patient not taking: Reported on 12/06/2022 06/13/21   Erin Fulling, MD  lactobacillus (FLORANEX/LACTINEX) PACK Place 1 packet (1 g total) into feeding tube 3 (three) times daily with meals. Patient not taking: Reported on 12/06/2022 06/13/21   Erin Fulling, MD  pantoprazole sodium (PROTONIX) 40 mg Place 40 mg into feeding tube daily. Patient not taking: Reported on 12/06/2022 06/13/21   Erin Fulling, MD  Scheduled Meds:  amiodarone  200 mg Per Tube BID   ascorbic acid  500 mg Per Tube BID   atorvastatin  40 mg Per Tube Daily   chlorhexidine  15 mL Mouth Rinse BID   Chlorhexidine Gluconate Cloth  6 each Topical Q0600   vitamin D3  2,000 Units Per Tube Daily   escitalopram  10 mg Per Tube Daily   famotidine  10 mg Per Tube Daily   feeding supplement (NEPRO CARB STEADY)  1,000 mL Per Tube Q24H   feeding supplement (PROSource TF20)  60 mL Per Tube Daily    free water  30 mL Per Tube Q4H   heparin injection (subcutaneous)  5,000 Units Subcutaneous Q8H   hydrocortisone sod succinate (SOLU-CORTEF) inj  50 mg Intravenous Q6H   hydrOXYzine  50 mg Per Tube Q6H   insulin aspart  0-6 Units Subcutaneous Q4H   lactobacillus  1 g Per Tube TID WC   midodrine  15 mg Per Tube TID WC   multivitamin  1 tablet Per Tube QHS   mupirocin ointment   Nasal BID   nutrition supplement (JUVEN)  1 packet Per Tube BID BM   mouth rinse  15 mL Mouth Rinse 4 times per day   vancomycin variable dose per unstable renal function (pharmacist dosing)   Does not apply See admin instructions   Continuous Infusions:  sodium chloride Stopped (12/07/22 0210)   sodium chloride Stopped (12/08/22 1823)   norepinephrine (LEVOPHED) Adult infusion 7 mcg/min (12/09/22 0653)   potassium chloride 10 mEq (12/09/22 0725)   PRN Meds:.sodium chloride, acetaminophen **OR** acetaminophen, artificial tears, ipratropium-albuterol, loperamide HCl, magnesium hydroxide, ondansetron **OR** ondansetron (ZOFRAN) IV, mouth rinse, sennosides, traZODone   Critical care provider statement:   Total critical care time: 33 minutes   Performed by: Karna Christmas MD   Critical care time was exclusive of separately billable procedures and treating other patients.   Critical care was necessary to treat or prevent imminent or life-threatening deterioration.   Critical care was time spent personally by me on the following activities: development of treatment plan with patient and/or surrogate as well as nursing, discussions with consultants, evaluation of patient's response to treatment, examination of patient, obtaining history from patient or surrogate, ordering and performing treatments and interventions, ordering and review of laboratory studies, ordering and review of radiographic studies, pulse oximetry and re-evaluation of patient's condition.    Vida Rigger, M.D.  Pulmonary & Critical Care Medicine

## 2022-12-09 NOTE — Consult Note (Signed)
WOC Nurse Consult Note: Reason for Consult: pressure injuries and skin breakdown Patient from SNF with AMS, incontinent of B/B, history of DM, g-tube in place for nutritional supplementation, ESRD/HD Wound type: ICD (irritant contact dermatitis) buttocks and intertriginous  ICD-10 CM Codes for Irritant Dermatitis L24A2 - Due to fecal, urinary or dual incontinence L30.4  - Erythema intertrigo. Also used for abrasion of the hand, chafing of the skin, dermatitis due to sweating and friction, friction dermatitis, friction eczema, and genital/thigh intertrigo.  Weak skin over buttocks some potential areas of sheer and pressure. Partial thickness skin loss, Stage 2 Pressure injuries Pressure Injury POA: Yes Measurement:scattered areas over buttock and linear areas in the skin folds under the pannus Wound OZH:YQMV Drainage (amount, consistency, odor) not reported Periwound: evidence of previous wounds on the sacrum and buttocks  Dressing procedure/placement/frequency: LALM in place while in the ICU for moisture management and pressure redistribution Added Gerhardt's butt cream for ICD BID and after each episode of incontinence Silicone foam if buttocks improve   Discussed POC with bedside nurses in the ICU.  Re consult if needed, will not follow at this time. Thanks  Amory Simonetti [employer], RN,CWOCN, CNS, CWON-AP [phone])

## 2022-12-09 NOTE — Plan of Care (Signed)

## 2022-12-09 NOTE — TOC Initial Note (Addendum)
Transition of Care [facility]) - Initial/Assessment Note    Patient Details  Name: Alexandra Foster MRN: [id number] Date of Birth: 1951-02-09  Transition of Care [facility]) CM/SW Contact:    Kreg Shropshire, RN Phone Number: 12/09/2022, 9:34 AM  Clinical Narrative:    Pt AAOx1 and cm spoke to Daughter [name].                 Pt arrived from ED from: From [employer] long term resident confirmed with Tiffany Caregiver Support: Passenger transport manager DME at Home: Used Animator: Provided by [employer] Previous Services: SNF HH/SNF Preference: Emergency planning/management officer of Contact: Spouse and daughter PCP: Lorine Bears, MD  Pt is a HD pt.   Cm will continue to follow for toc needs and d/c planning.   Expected Discharge Plan: Skilled Nursing Facility Barriers to Discharge: Continued Medical Work up   Patient Goals and CMS Choice   CMS Medicare.gov Compare Post Acute Care list provided to:: Patient Represenative (must comment) Choice offered to / list presented to : Adult Children      Expected Discharge Plan and Services     Post Acute Care Choice: Skilled Nursing Facility Living arrangements for the past 2 months: Skilled Nursing Facility                                      Prior Living Arrangements/Services Living arrangements for the past 2 months: Skilled Nursing Facility Lives with:: Facility Resident          Need for Family Participation in Patient Care: Yes (Comment) Care giver support system in place?: Yes (comment) Current home services: DME    Activities of Daily Living Home Assistive Devices/Equipment: None ADL Screening (condition at time of admission) Patient's cognitive ability adequate to safely complete daily activities?: No Is the patient deaf or have difficulty hearing?: No Does the patient have difficulty seeing, even when wearing glasses/contacts?: Yes Does the patient have difficulty concentrating,  remembering, or making decisions?: Yes Patient able to express need for assistance with ADLs?: No Does the patient have difficulty dressing or bathing?: Yes Independently performs ADLs?: No Does the patient have difficulty walking or climbing stairs?: Yes Weakness of Legs: Both Weakness of Arms/Hands: Both  Permission Sought/Granted                  Emotional Assessment              Admission diagnosis:  Lower urinary tract infectious disease [N39.0] Altered mental status, unspecified altered mental status type [R41.82] Sepsis due to gram-negative UTI (HCC) [A41.50, N39.0] Sepsis, due to unspecified organism, unspecified whether acute organ dysfunction present [facility]) [A41.9] Patient Active Problem List   Diagnosis Date Noted   MRSA bacteremia 12/06/2022   Acute metabolic encephalopathy 12/06/2022   Paroxysmal atrial fibrillation (HCC) 12/06/2022   Anxiety and depression 12/06/2022   Type 2 diabetes mellitus with chronic kidney disease, with long-term current use of insulin (HCC) 12/06/2022   ESRD on hemodialysis (HCC) 12/06/2022   Septic shock (HCC) 12/06/2022   Abdominal distension    Left leg cellulitis    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   Subdural hemorrhage (HCC) 05/18/2021   Necrotizing fasciitis (HCC) 05/12/2021   Acute and chronic respiratory failure (acute-on-chronic) (HCC) 05/12/2021   On mechanically assisted ventilation (HCC) 05/12/2021   Shock liver 05/12/2021   Anemia associated with acute  blood loss 05/12/2021   Metabolic acidosis 05/12/2021   Encounter for continuous renal replacement therapy (CRRT) for acute renal failure (HCC) 05/12/2021   Acute encephalopathy 05/12/2021   Group A streptococcal infection 05/12/2021   Streptococcal toxic shock syndrome (HCC) 05/12/2021   Atrial fibrillation and flutter (HCC)    Severe sepsis with septic shock (CODE) (HCC) 05/04/2021   Personal history of COVID-19 07/14/2019   Bilateral lower extremity  edema 05/03/2018   Lower extremity pain, bilateral 05/03/2018   Diabetes mellitus type 2 in obese 04/23/2018   Osteoarthritis of both knees 01/14/2018   Urge incontinence 03/14/2016   Osteopenia 02/13/2016   OSA (obstructive sleep apnea) 01/08/2016   Allergic rhinitis 04/13/2015   COPD (chronic obstructive pulmonary disease) (HCC) 09/18/2014   Morbid obesity (HCC) 09/18/2014   Esophagitis, reflux 09/18/2014   Essential hypertension 09/21/2006   PCP:  Patrecia Pour, MD Pharmacy:   [facility] - [place], [place] - [address] [place] [address] Phone: [phone] Fax: [phone]  [facility] [address] [place] - [address] [place] [address] Phone: [phone] Fax: [phone]     Social Determinants of Health (SDOH) Social History: SDOH Screenings   Food Insecurity: Patient Unable To Answer (12/06/2022)  Housing: High Risk (12/06/2022)  Transportation Needs: No Transportation Needs (12/06/2022)  Utilities: Not At Risk (12/06/2022)  Alcohol Screen: Low Risk  (02/11/2021)  Depression (PHQ2-9): Low Risk  (04/30/2021)  Financial Resource Strain: Medium Risk (08/09/2020)  Physical Activity: Inactive (08/09/2020)  Social Connections: Moderately Integrated (08/09/2020)  Stress: No Stress Concern Present (08/09/2020)  Tobacco Use: Medium Risk (12/05/2022)   SDOH Interventions: Food Insecurity Interventions: Patient Unable to Answer   Readmission Risk Interventions     No data to display

## 2022-12-09 NOTE — Progress Notes (Signed)
Updated pts daughter regarding plan of care and all questions answered.    Zada Girt, AGNP  Pulmonary/Critical Care Pager [phone] (please enter 7 digits) PCCM Consult Pager [phone] (please enter 7 digits) a

## 2022-12-10 ENCOUNTER — Inpatient Hospital Stay: Payer: Medicare Other

## 2022-12-10 DIAGNOSIS — R6521 Severe sepsis with septic shock: Secondary | ICD-10-CM | POA: Diagnosis not present

## 2022-12-10 DIAGNOSIS — A4102 Sepsis due to Methicillin resistant Staphylococcus aureus: Secondary | ICD-10-CM | POA: Diagnosis not present

## 2022-12-10 DIAGNOSIS — A419 Sepsis, unspecified organism: Secondary | ICD-10-CM | POA: Diagnosis not present

## 2022-12-10 DIAGNOSIS — Z7189 Other specified counseling: Secondary | ICD-10-CM | POA: Diagnosis not present

## 2022-12-10 LAB — CBC
HCT: 23.1 % — ABNORMAL LOW (ref 36.0–46.0)
Hemoglobin: 7.4 g/dL — ABNORMAL LOW (ref 12.0–15.0)
MCH: 27.4 pg (ref 26.0–34.0)
MCHC: 32 g/dL (ref 30.0–36.0)
MCV: 85.6 fL (ref 80.0–100.0)
Platelets: 244 10*3/uL (ref 150–400)
RBC: 2.7 MIL/uL — ABNORMAL LOW (ref 3.87–5.11)
RDW: 16.8 % — ABNORMAL HIGH (ref 11.5–15.5)
WBC: 38.8 10*3/uL — ABNORMAL HIGH (ref 4.0–10.5)
nRBC: 2.3 % — ABNORMAL HIGH (ref 0.0–0.2)

## 2022-12-10 LAB — RENAL FUNCTION PANEL
Albumin: 2.2 g/dL — ABNORMAL LOW (ref 3.5–5.0)
Anion gap: 12 (ref 5–15)
BUN: 89 mg/dL — ABNORMAL HIGH (ref 8–23)
CO2: 20 mmol/L — ABNORMAL LOW (ref 22–32)
Calcium: 8.6 mg/dL — ABNORMAL LOW (ref 8.9–10.3)
Chloride: 103 mmol/L (ref 98–111)
Creatinine, Ser: 3.31 mg/dL — ABNORMAL HIGH (ref 0.44–1.00)
GFR, Estimated: 14 mL/min — ABNORMAL LOW (ref >60)
Glucose, Bld: 179 mg/dL — ABNORMAL HIGH (ref 70–99)
Phosphorus: 1.1 mg/dL — ABNORMAL LOW (ref 2.5–4.6)
Potassium: 3.5 mmol/L (ref 3.5–5.1)
Sodium: 135 mmol/L (ref 135–145)

## 2022-12-10 LAB — GLUCOSE, CAPILLARY
Glucose-Capillary: 107 mg/dL — ABNORMAL HIGH (ref 70–99)
Glucose-Capillary: 116 mg/dL — ABNORMAL HIGH (ref 70–99)
Glucose-Capillary: 120 mg/dL — ABNORMAL HIGH (ref 70–99)
Glucose-Capillary: 140 mg/dL — ABNORMAL HIGH (ref 70–99)
Glucose-Capillary: 154 mg/dL — ABNORMAL HIGH (ref 70–99)
Glucose-Capillary: 154 mg/dL — ABNORMAL HIGH (ref 70–99)
Glucose-Capillary: 156 mg/dL — ABNORMAL HIGH (ref 70–99)
Glucose-Capillary: 177 mg/dL — ABNORMAL HIGH (ref 70–99)
Glucose-Capillary: 177 mg/dL — ABNORMAL HIGH (ref 70–99)

## 2022-12-10 LAB — ZINC: Zinc: 55 ug/dL (ref 44–115)

## 2022-12-10 LAB — COPPER, SERUM: Copper: 29 ug/dL — ABNORMAL LOW (ref 80–158)

## 2022-12-10 MED ORDER — DEXTROSE 5 % IV SOLN
2.0000 mg | Freq: Every day | INTRAVENOUS | Status: AC
  Administered 2022-12-11 – 2022-12-17 (×7): 2 mg via INTRAVENOUS
  Filled 2022-12-10 (×7): qty 5

## 2022-12-10 MED ORDER — COPPER 2 MG PO TABS
2.0000 mg | Freq: Every day | Status: DC
  Administered 2022-12-18 – 2023-01-03 (×16): 2 mg
  Filled 2022-12-10 (×17): qty 1

## 2022-12-10 MED ORDER — POTASSIUM & SODIUM PHOSPHATES 280-160-250 MG PO PACK: 2.0000 | PACK | Freq: Once | ORAL | Status: DC

## 2022-12-10 MED ORDER — DROXIDOPA 100 MG PO CAPS
100.0000 mg | ORAL_CAPSULE | Freq: Three times a day (TID) | ORAL | Status: DC
  Administered 2022-12-10: 100 mg
  Filled 2022-12-10 (×3): qty 1

## 2022-12-10 MED ORDER — POTASSIUM & SODIUM PHOSPHATES 280-160-250 MG PO PACK
2.0000 | PACK | Freq: Two times a day (BID) | ORAL | Status: AC
  Administered 2022-12-10 (×2): 2
  Filled 2022-12-10 (×2): qty 2

## 2022-12-10 NOTE — Progress Notes (Signed)
Updated pt's daughter Earley Abide at bedside regarding plan of care.  All questions answered.    Harlon Ditty, AGACNP-BC [facility] Prefer epic messenger for cross cover needs If after hours, please call E-link

## 2022-12-10 NOTE — Progress Notes (Signed)
Date of Admission:  12/05/2022     ID: Alexandra Foster is a 72 y.o. female  Principal Problem:   Septic shock (HCC) Active Problems:   MRSA bacteremia   Acute metabolic encephalopathy   Paroxysmal atrial fibrillation (HCC)   Anxiety and depression   Type 2 diabetes mellitus with chronic kidney disease, with long-term current use of insulin (HCC)   ESRD on hemodialysis (HCC)    Subjective: Says she is feeling okay  Medications:   amiodarone  200 mg Per Tube BID   ascorbic acid  500 mg Per Tube BID   atorvastatin  40 mg Per Tube Daily   chlorhexidine  15 mL Mouth Rinse BID   Chlorhexidine Gluconate Cloth  6 each Topical Q0600   vitamin D3  2,000 Units Per Tube Daily   [START ON 12/18/2022] copper  2 mg Per Tube Daily   escitalopram  10 mg Per Tube Daily   famotidine  10 mg Per Tube Daily   feeding supplement (NEPRO CARB STEADY)  1,000 mL Per Tube Q24H   feeding supplement (PROSource TF20)  60 mL Per Tube Daily   free water  30 mL Per Tube Q4H   Gerhardt's butt cream   Topical BID   heparin injection (subcutaneous)  5,000 Units Subcutaneous Q8H   hydrOXYzine  50 mg Per Tube Q6H   insulin aspart  0-6 Units Subcutaneous Q4H   midodrine  15 mg Per Tube TID WC   multivitamin  1 tablet Per Tube QHS   mupirocin ointment   Nasal BID   nutrition supplement (JUVEN)  1 packet Per Tube BID BM   mouth rinse  15 mL Mouth Rinse 4 times per day   potassium & sodium phosphates  2 packet Per Tube BID   vancomycin variable dose per unstable renal function (pharmacist dosing)   Does not apply See admin instructions    Objective: Vital signs in last 24 hours: Patient Vitals for the past 24 hrs:  BP Temp Temp src Pulse Resp SpO2 Weight  12/10/22 1000 (!) 96/52 -- -- 67 20 98 % --  12/10/22 0945 91/65 -- -- 67 (!) 21 98 % --  12/10/22 0930 (!) 108/52 -- -- 70 (!) 24 100 % --  12/10/22 0915 (!) 118/48 -- -- 70 20 99 % --  12/10/22 0900 (!) 78/59 -- -- 69 19 98 % --  12/10/22 0845 (!)  115/45 -- -- 69 20 98 % --  12/10/22 0830 (!) 103/46 -- -- 69 18 99 % --  12/10/22 0815 (!) 106/44 -- -- 69 20 98 % --  12/10/22 0800 (!) 103/47 97.7 F (36.5 C) Oral 72 (!) 23 99 % --  12/10/22 0745 (!) 103/44 -- -- 68 19 99 % --  12/10/22 0730 (!) 102/48 -- -- 68 20 99 % --  12/10/22 0715 (!) 96/48 -- -- 66 19 98 % --  12/10/22 0700 (!) 96/44 -- -- 66 19 99 % --  12/10/22 0500 -- -- -- -- -- -- 89.5 kg  12/10/22 0445 (!) 107/53 -- -- 66 18 93 % --  12/10/22 0430 (!) 103/47 -- -- 68 (!) 21 95 % --  12/10/22 0415 94/80 -- -- 73 20 (!) 87 % --  12/10/22 0400 (!) 99/44 97.6 F (36.4 C) Oral 71 17 98 % --  12/10/22 0345 (!) 89/34 -- -- 66 20 97 % --  12/10/22 0330 (!) 91/37 -- -- 66 20 97 % --  12/10/22 0315 (!) 102/38 -- -- 66 (!) 22 97 % --  12/10/22 0300 (!) 106/36 -- -- 67 20 97 % --  12/10/22 0245 (!) 95/38 -- -- 70 (!) 40 97 % --  12/10/22 0230 (!) 96/37 -- -- 67 (!) 21 97 % --  12/10/22 0215 (!) 115/41 -- -- 71 18 97 % --  12/10/22 0200 (!) 104/35 -- -- 66 (!) 24 98 % --  12/10/22 0145 (!) 85/33 -- -- 66 (!) 24 97 % --  12/10/22 0130 (!) 97/44 -- -- 67 20 98 % --  12/10/22 0115 (!) 93/42 -- -- 69 19 98 % --  12/10/22 0100 (!) 92/37 -- -- 69 (!) 46 97 % --  12/10/22 0045 (!) 99/41 -- -- 66 (!) 22 98 % --  12/10/22 0030 (!) 95/36 -- -- 66 (!) 39 98 % --  12/10/22 0015 (!) 95/37 -- -- 66 (!) 32 98 % --  12/10/22 0000 (!) 96/33 97.6 F (36.4 C) Oral 69 (!) 21 98 % --  12/09/22 2345 (!) 97/33 -- -- 67 (!) 30 97 % --  12/09/22 2330 (!) 100/37 -- -- 67 (!) 22 98 % --  12/09/22 2315 (!) 101/40 -- -- 66 17 98 % --  12/09/22 2300 (!) 95/47 -- -- 66 18 98 % --  12/09/22 2245 (!) 102/55 -- -- 67 (!) 23 99 % --  12/09/22 2230 97/67 -- -- 66 17 100 % --  12/09/22 2215 (!) 100/37 -- -- 67 14 99 % --  12/09/22 2200 (!) 105/45 -- -- 71 19 99 % --  12/09/22 2145 (!) 98/38 -- -- 65 18 97 % --  12/09/22 2045 (!) 98/41 -- -- 64 17 98 % --  12/09/22 2030 (!) 97/43 -- -- 63 20 98 % --   12/09/22 2015 (!) 94/40 -- -- 64 18 98 % --  12/09/22 2000 (!) 95/41 -- -- 63 (!) 31 98 % --  12/09/22 1945 (!) 96/43 98 F (36.7 C) Oral 63 18 99 % --  12/09/22 1930 (!) 97/45 -- -- 63 (!) 22 99 % --  12/09/22 1915 (!) 94/44 -- -- 64 19 100 % --  12/09/22 1900 (!) 76/55 -- -- 63 (!) 26 99 % --  12/09/22 1845 (!) 94/36 -- -- 64 18 100 % --  12/09/22 1830 (!) 84/53 -- -- 66 17 99 % --  12/09/22 1815 (!) 99/52 -- -- 66 (!) 21 100 % --  12/09/22 1800 (!) 86/45 -- -- 71 (!) 24 96 % --  12/09/22 1745 (!) 106/93 -- -- 66 -- 99 % --  12/09/22 1730 102/67 -- -- 70 (!) 22 100 % --  12/09/22 1700 (!) 90/44 -- -- 63 18 100 % --  12/09/22 1645 (!) 92/43 -- -- 65 19 99 % --  12/09/22 1630 (!) 88/44 -- -- 65 18 99 % --  12/09/22 1615 (!) 93/46 -- -- 65 20 100 % --  12/09/22 1600 (!) 98/58 98.8 F (37.1 C) Oral 69 18 100 % --  12/09/22 1545 (!) 97/41 -- -- 66 20 98 % --  12/09/22 1530 (!) 94/44 -- -- 65 20 99 % --  12/09/22 1515 (!) 95/44 -- -- 66 (!) 22 99 % --  12/09/22 1500 (!) 94/38 -- -- 67 (!) 23 99 % --  12/09/22 1445 (!) 98/45 -- -- 72 20 100 % --  12/09/22 1430 (!) 102/48 -- -- 68  19 97 % --  12/09/22 1415 104/67 -- -- 69 18 99 % --  12/09/22 1400 (!) 97/51 -- -- 66 17 97 % --  12/09/22 1345 (!) 109/47 -- -- 72 (!) 21 98 % --  12/09/22 1330 (!) 130/52 -- -- 81 17 99 % --  12/09/22 1315 (!) 102/54 -- -- 73 (!) 28 99 % --  12/09/22 1300 (!) 105/51 -- -- 68 (!) 21 98 % --  12/09/22 1250 95/80 -- -- 77 20 98 % --       PHYSICAL EXAM:  General:awake, no distress . Lungs: b/ll air entry Heart: Regular rate and rhythm, no murmur, rub or gallop. Abdomen: Soft, PEG in place Extremities: atraumatic, no cyanosis. No edema. No clubbing Skin: edema of the skin- stretched and shiny Rectal foley Some liquid stool due to PEG feeds  Lab Results    Latest Ref Rng & Units 12/10/2022    5:51 AM 12/09/2022    4:17 AM 12/08/2022   10:17 PM  CBC  WBC 4.0 - 10.5 K/uL 38.8  33.7    Hemoglobin  12.0 - 15.0 g/dL 7.4  7.4  7.6   Hematocrit 36.0 - 46.0 % 23.1  22.7  23.2   Platelets 150 - 400 K/uL 244  213         Latest Ref Rng & Units 12/10/2022    5:51 AM 12/09/2022    6:35 PM 12/09/2022   12:56 PM  CMP  Glucose 70 - 99 mg/dL 784     BUN 8 - 23 mg/dL 89     Creatinine 6.96 - 1.00 mg/dL 2.95     Sodium 284 - 132 mmol/L 135     Potassium 3.5 - 5.1 mmol/L 3.5  4.2  3.0   Chloride 98 - 111 mmol/L 103     CO2 22 - 32 mmol/L 20     Calcium 8.9 - 10.3 mg/dL 8.6         Microbiology: 8/23 [facility] - MRSA 8/24 Cath tip MRSA 8/25 BC- NG so far Urine culture enterococcus and yeast Studies/Results: DG Abd 1 View  Result Date: 12/09/2022 CLINICAL DATA:  Abdominal distension, hypotension EXAM: ABDOMEN - 1 VIEW COMPARISON:  10/13/2022 FINDINGS: Three supine frontal views of the abdomen and pelvis are obtained. Pubic symphysis is excluded by collimation. Percutaneous gastrostomy tube overlies the central upper abdomen. The bowel gas pattern is unremarkable without obstruction or ileus. Stable 7 mm right renal calculus. Numerous canal vein phleboliths are again noted. No abdominal mass. IVC filter again noted. Degenerative changes of the lower lumbar spine and bilateral hips. Lung bases are clear. IMPRESSION: 1. Unremarkable bowel gas pattern. 2. Stable 7 mm right renal calculus. Electronically Signed   By: Sharlet Salina M.D.   On: 12/09/2022 22:18   US Carotid Bilateral  Result Date: 12/08/2022 CLINICAL DATA:  TIA Hypertension Diabetes Former tobacco user EXAM: BILATERAL CAROTID DUPLEX ULTRASOUND TECHNIQUE: Wallace Cullens scale imaging, color Doppler and duplex ultrasound were performed of bilateral carotid and vertebral arteries in the neck. COMPARISON:  08/03/2013 FINDINGS: Criteria: Quantification of carotid stenosis is based on velocity parameters that correlate the residual internal carotid diameter with NASCET-based stenosis levels, using the diameter of the distal internal carotid lumen as the  denominator for stenosis measurement. The following velocity measurements were obtained: RIGHT ICA: 75/11 cm/sec CCA: 94/16 cm/sec SYSTOLIC ICA/CCA RATIO:  0.8 ECA: 135 cm/sec LEFT ICA: 90/17 cm/sec CCA: 76/15 cm/sec SYSTOLIC ICA/CCA RATIO:  1.2 ECA: 60 cm/sec RIGHT  CAROTID ARTERY: No significant atheromatous plaque. RIGHT VERTEBRAL ARTERY:  Antegrade flow. LEFT CAROTID ARTERY:  No significant atheromatous plaque. LEFT VERTEBRAL ARTERY:  Antegrade flow. IMPRESSION: No significant stenosis of internal carotid arteries. Electronically Signed   By: Acquanetta Belling M.D.   On: 12/08/2022 16:55     Assessment/Plan: MRSA bacteremia ( 8/23 and 8/24 culture positive) with septic shock Related to dialysis catheter Which has been removed Cath tip culture MRSA Repeat blood culture sent on 8/25 Will get 2 d echo will need TEE. If tee cannot be done need to empirically treat for 6 weeks on vancomycin   ESRD on dialysis- cath removed Need to have a negative blood culture before a line can be placed ? Severe leucocytosis Could be secondary to the severe anemia Could be related to not having dialysis Unlikely she has Cdiff     Anemia Hb down to 6.7? 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

## 2022-12-10 NOTE — Discharge Planning (Signed)
ESTABLISHED HEMPDIALYSIS  Outpatient Facility  [facility]  [address]  [place] [address] [phone]  Schedule: TTS 11:20AM  Confirmed above schedule with clinic, patient is active and can resume upon discharge.  Dimas Chyle Dialysis Coordinator II  [facility] Cell: [phone] eFax: [phone] [email or web address]

## 2022-12-10 NOTE — Progress Notes (Addendum)
PHARMACY CONSULT NOTE  Pharmacy Consult for Electrolyte Monitoring and Replacement   Recent Labs: Potassium (mmol/L)  Date Value  12/10/2022 3.5  08/11/2013 3.3 (L)   Magnesium (mg/dL)  Date Value  16/01/9603 2.4   Calcium (mg/dL)  Date Value  54/12/8117 8.6 (L)   Calcium, Total (mg/dL)  Date Value  14/78/2956 9.2   Albumin (g/dL)  Date Value  21/30/8657 2.2 (L)  08/11/2013 3.1 (L)   Phosphorus (mg/dL)  Date Value  84/69/6295 1.1 (L)   Sodium (mmol/L)  Date Value  12/10/2022 135  08/11/2013 138    Assessment: 72 y.o. female with medical history significant for asthma, COPD, type diabetes mellitus, s/p G-tube, GERD, ESRD on HD, hypertension, osteoarthritis, paroxysmal atrial fibrillation and OSA on CPAP, who presented to the emergency room with acute onset of altered mental status with lethargy and decreased responsiveness now on CRRT.  CRRT stopped 8/25. Plan for line holiday. Patient is on tube feeds and having diarrhea. Hypophosphatemia suspected secondary to re-feeding.   Goal of Therapy:  Electrolytes WNL  Plan:  --Phos 1.1, give Phos-Nak 2 packets per tube x 2 doses. Trying to avoid IV replacement to avoid fluid overload with renal replacement therapy on hold --Re-check electrolytes again tomorrow AM  Tressie Ellis 12/10/2022 7:58 AM

## 2022-12-10 NOTE — Progress Notes (Addendum)
Daily Progress Note   Patient Name: Alexandra Foster       Date: 12/10/2022 DOB: May 13, 1950  Age: 72 y.o. MRN#: [id number] Attending Physician: Vida Rigger, MD Primary Care Physician: Patrecia Pour, MD Admit Date: 12/05/2022  Reason for Consultation/Follow-up: Establishing goals of care  Subjective: In to see patient.  No family at bedside at this time.  She is sleeping soundly at this time.  Per nursing, patient has continued to have confusion.  Plans to restart dialysis tomorrow.    Will plan to reach out to patient's family; patient is married and has a daughter.  Length of Stay: 4  Current Medications: Scheduled Meds:   amiodarone  200 mg Per Tube BID   ascorbic acid  500 mg Per Tube BID   atorvastatin  40 mg Per Tube Daily   chlorhexidine  15 mL Mouth Rinse BID   Chlorhexidine Gluconate Cloth  6 each Topical Q0600   vitamin D3  2,000 Units Per Tube Daily   [START ON 12/18/2022] copper  2 mg Per Tube Daily   escitalopram  10 mg Per Tube Daily   famotidine  10 mg Per Tube Daily   feeding supplement (NEPRO CARB STEADY)  1,000 mL Per Tube Q24H   feeding supplement (PROSource TF20)  60 mL Per Tube Daily   free water  30 mL Per Tube Q4H   Gerhardt's butt cream   Topical BID   heparin injection (subcutaneous)  5,000 Units Subcutaneous Q8H   hydrOXYzine  50 mg Per Tube Q6H   insulin aspart  0-6 Units Subcutaneous Q4H   midodrine  15 mg Per Tube TID WC   multivitamin  1 tablet Per Tube QHS   mupirocin ointment   Nasal BID   nutrition supplement (JUVEN)  1 packet Per Tube BID BM   mouth rinse  15 mL Mouth Rinse 4 times per day   potassium & sodium phosphates  2 packet Per Tube BID   vancomycin variable dose per unstable renal function (pharmacist dosing)   Does  not apply See admin instructions    Continuous Infusions:  sodium chloride Stopped (12/07/22 0210)   sodium chloride Stopped (12/08/22 1823)   [START ON 12/11/2022] copper chloride 2 mg in dextrose 5 % 100 mL IVPB     norepinephrine (LEVOPHED) Adult infusion  1 mcg/min (12/10/22 1319)    PRN Meds: sodium chloride, acetaminophen **OR** acetaminophen, artificial tears, clonazePAM, ipratropium-albuterol, loperamide HCl, magnesium hydroxide, ondansetron **OR** ondansetron (ZOFRAN) IV, mouth rinse, sennosides, traZODone  Physical Exam Constitutional:      Comments: Eyes closed  Pulmonary:     Effort: Pulmonary effort is normal.     Comments: Unlabored            Vital Signs: BP (!) 84/65   Pulse 68   Temp 97.7 F (36.5 C) (Oral)   Resp (!) 21   Ht 5\' 4"  (1.626 m)   Wt 89.5 kg   SpO2 98%   BMI 33.87 kg/m  SpO2: SpO2: 98 % O2 Device: O2 Device: Room Air O2 Flow Rate:    Intake/output summary:  Intake/Output Summary (Last 24 hours) at 12/10/2022 1538 Last data filed at 12/10/2022 1000 Gross per 24 hour  Intake 1644.75 ml  Output 350 ml  Net 1294.75 ml   LBM: Last BM Date : 12/10/22 Baseline Weight: Weight: 84.7 kg Most recent weight: Weight: 89.5 kg    Patient Active Problem List   Diagnosis Date Noted   MRSA bacteremia 12/06/2022   Acute metabolic encephalopathy 12/06/2022   Paroxysmal atrial fibrillation (HCC) 12/06/2022   Anxiety and depression 12/06/2022   Type 2 diabetes mellitus with chronic kidney disease, with long-term current use of insulin (HCC) 12/06/2022   ESRD on hemodialysis (HCC) 12/06/2022   Septic shock (HCC) 12/06/2022   Abdominal distension    Left leg cellulitis    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   Subdural hemorrhage (HCC) 05/18/2021   Necrotizing fasciitis (HCC) 05/12/2021   Acute and chronic respiratory failure (acute-on-chronic) (HCC) 05/12/2021   On mechanically assisted ventilation (HCC) 05/12/2021   Shock liver 05/12/2021    Anemia associated with acute blood loss 05/12/2021   Metabolic acidosis 05/12/2021   Encounter for continuous renal replacement therapy (CRRT) for acute renal failure (HCC) 05/12/2021   Acute encephalopathy 05/12/2021   Group A streptococcal infection 05/12/2021   Streptococcal toxic shock syndrome (HCC) 05/12/2021   Atrial fibrillation and flutter (HCC)    Severe sepsis with septic shock (CODE) (HCC) 05/04/2021   Personal history of COVID-19 07/14/2019   Bilateral lower extremity edema 05/03/2018   Lower extremity pain, bilateral 05/03/2018   Diabetes mellitus type 2 in obese 04/23/2018   Osteoarthritis of both knees 01/14/2018   Urge incontinence 03/14/2016   Osteopenia 02/13/2016   OSA (obstructive sleep apnea) 01/08/2016   Allergic rhinitis 04/13/2015   COPD (chronic obstructive pulmonary disease) (HCC) 09/18/2014   Morbid obesity (HCC) 09/18/2014   Esophagitis, reflux 09/18/2014   Essential hypertension 09/21/2006    Palliative Care Assessment & Plan    Recommendations/Plan: PMT will follow  Code Status:    Code Status Orders  (From admission, onward)           Start     Ordered   12/06/22 0124  Full code  Continuous       Question:  By:  Answer:  Consent: discussion documented in EHR   12/06/22 0132           Code Status History     Date Active Date Inactive Code Status Order ID Comments User Context   05/04/2021 2329 06/13/2021 2307 Full Code [id number]  Jimmye Norman, NP ED   04/30/2019 1934 05/03/2019 1822 Full Code [id number]  Clydia Llano, MD Inpatient   04/29/2019 2048 04/30/2019 1830 Full Code [id number]  Mansy, Vernetta Honey, MD ED  Thank you for allowing the Palliative Medicine Team to assist in the care of this patient.    Morton Stall, NP  Please contact Palliative Medicine Team phone at [phone] for questions and concerns.

## 2022-12-10 NOTE — Progress Notes (Signed)
[facility]  ROUNDING NOTE   Subjective:   Patient remains confused.   Daughter at bedside   Weaned down on norepinephrine gtt. Now through peripheral line.   PRBC transfusion yesterday.   UOP  Wbc 38.8 (33.7) (34.5)  Repeat blood cultures on 8/25 negative for growth for 3 days.   Objective:  Vital signs in last 24 hours:  Temp:  [97.6 F (36.4 C)-98.8 F (37.1 C)] 97.7 F (36.5 C) (08/28 0800) Pulse Rate:  [63-81] 67 (08/28 1000) Resp:  [14-46] 20 (08/28 1000) BP: (76-130)/(33-93) 96/52 (08/28 1000) SpO2:  [87 %-100 %] 98 % (08/28 1000) Weight:  [89.5 kg] 89.5 kg (08/28 0500)  Weight change: -1 kg Filed Weights   12/08/22 0233 12/09/22 0430 12/10/22 0500  Weight: 90.3 kg 90.5 kg 89.5 kg    Intake/Output: I/O last 3 completed shifts: In: 3355.2 [I.V.:683.2; Other:470; NG/GT:1847.5; IV Piggyback:354.5] Out: 650 [Urine:650]   Intake/Output this shift:  Total I/O In: 518.2 [I.V.:88.2; Other:40; NG/GT:390] Out: -   Physical Exam: General: Critically ill  Head: Normocephalic, atraumatic. Moist oral mucosal membranes  Eyes: Anicteric  Lungs:  Diminished bilaterally   Heart: regular   Abdomen:  Soft, nontender  Extremities:  1+ peripheral edema.  Neurologic: Alert to self and place, not to situation or time   Skin: No lesions  Access:  none    Basic Metabolic Panel: Recent Labs  Lab 12/07/22 0213 12/07/22 1608 12/07/22 1843 12/08/22 0525 12/08/22 0613 12/09/22 0417 12/09/22 1256 12/09/22 1835 12/10/22 0551  NA 132* 132*  --  130* 132* 132*  --   --  135  K 3.2* 3.9  --  3.6 3.7 2.9* 3.0* 4.2 3.5  CL 103 101  --  100 102 102  --   --  103  CO2 20* 21*  --  21* 20* 20*  --   --  20*  GLUCOSE 149* 211*  --  178* 184* 191*  --   --  179*  BUN 32* 26*  --  41* 38* 57*  --   --  89*  CREATININE 1.97* 1.82*  --  2.38* 2.57* 2.88*  --   --  3.31*  CALCIUM 8.7* 7.7*  --  8.2* 8.2* 8.4*  --   --  8.6*  MG 1.9  --  2.2 2.3 2.2 2.4  --    --   --   PHOS 2.5 1.8* 1.8* 1.7*  1.6* 1.7* 1.7* 1.1* 1.1* 1.1*    Liver Function Tests: Recent Labs  Lab 12/05/22 2056 12/06/22 0354 12/07/22 0213 12/07/22 1608 12/08/22 0525 12/09/22 0417 12/10/22 0551  AST 66*  --   --   --   --   --   --   ALT 87*  --   --   --   --   --   --   ALKPHOS 157*  --   --   --   --   --   --   BILITOT 0.6  --   --   --   --   --   --   PROT 8.6*  --   --   --   --   --   --   ALBUMIN 3.3*   < > 1.9* 1.8* 2.0* 2.1* 2.2*   < > = values in this interval not displayed.   No results for input(s): "LIPASE", "AMYLASE" in the last 168 hours. No results for input(s): "AMMONIA" in the last  168 hours.  CBC: Recent Labs  Lab 12/05/22 2056 12/06/22 0404 12/07/22 0213 12/08/22 0525 12/08/22 8413 12/08/22 1656 12/08/22 2217 12/09/22 0417 12/10/22 0551  WBC 21.4*   < > 22.5* 34.6* 34.5*  --   --  33.7* 38.8*  NEUTROABS 19.4*  --   --   --   --   --   --   --   --   HGB 10.2*   < > 8.8* 6.7* 6.7* 7.7* 7.6* 7.4* 7.4*  HCT 33.4*   < > 28.3* 21.4* 21.7* 23.6* 23.2* 22.7* 23.1*  MCV 84.3   < > 83.0 82.6 83.8  --   --  83.5 85.6  PLT 411*   < > 293 242 256  --   --  213 244   < > = values in this interval not displayed.    Cardiac Enzymes: No results for input(s): "CKTOTAL", "CKMB", "CKMBINDEX", "TROPONINI" in the last 168 hours.  BNP: Invalid input(s): "POCBNP"  CBG: Recent Labs  Lab 12/09/22 1923 12/10/22 0004 12/10/22 0356 12/10/22 0802 12/10/22 1112  GLUCAP 129* 156* 177* 154* 140*    Microbiology: Results for orders placed or performed during the hospital encounter of 12/05/22  Culture, blood (Routine x 2)     Status: Abnormal   Collection Time: 12/05/22  8:56 PM   Specimen: BLOOD  Result Value Ref Range Status   Specimen Description   Final    BLOOD BLOOD RIGHT ARM Performed at [facility], [address] [place], [place] [address]    Special Requests   Final    BOTTLES DRAWN AEROBIC AND ANAEROBIC Blood Culture  adequate volume Performed at [facility], [address] [place], [place] [address]    Culture  Setup Time   Final    GRAM POSITIVE COCCI IN BOTH AEROBIC AND ANAEROBIC BOTTLES CRITICAL RESULT CALLED TO, READ BACK BY AND VERIFIED WITH: SHEEMA HALLLAJI AT 1206 12/06/22.PMF Performed at [facility], [address] [place], [place] [address]    Culture (A)  Final    STAPHYLOCOCCUS AUREUS SUSCEPTIBILITIES PERFORMED ON PREVIOUS CULTURE WITHIN THE LAST 5 DAYS. Performed at [facility], [address] [place], [place] [address]    Report Status 12/08/2022 FINAL  Final  Culture, blood (Routine x 2)     Status: Abnormal   Collection Time: 12/05/22  8:57 PM   Specimen: BLOOD  Result Value Ref Range Status   Specimen Description   Final    BLOOD BLOOD LEFT ARM Performed at [facility], [address], [place], [place] [address]    Special Requests   Final    BOTTLES DRAWN AEROBIC AND ANAEROBIC Blood Culture adequate volume Performed at [facility], [address] [place], [place] [address]    Culture  Setup Time   Final    GRAM POSITIVE COCCI IN BOTH AEROBIC AND ANAEROBIC BOTTLES CRITICAL RESULT CALLED TO, READ BACK BY AND VERIFIED WITH: SHEEMA HALLAJI AT 1206 12/06/22.PMF Performed at [facility], [address], [place], [place] [address]    Culture METHICILLIN RESISTANT STAPHYLOCOCCUS AUREUS (A)  Final   Report Status 12/08/2022 FINAL  Final   Organism ID, Bacteria METHICILLIN RESISTANT STAPHYLOCOCCUS AUREUS  Final      Susceptibility   Methicillin resistant staphylococcus aureus - MIC*    CIPROFLOXACIN >=8 RESISTANT Resistant     ERYTHROMYCIN >=8 RESISTANT Resistant     GENTAMICIN <=0.5 SENSITIVE Sensitive     OXACILLIN >=  4 RESISTANT Resistant     TETRACYCLINE <=1 SENSITIVE Sensitive     VANCOMYCIN <=0.5 SENSITIVE Sensitive     TRIMETH/SULFA >=320 RESISTANT Resistant     CLINDAMYCIN <=0.25 SENSITIVE Sensitive      RIFAMPIN <=0.5 SENSITIVE Sensitive     Inducible Clindamycin NEGATIVE Sensitive     LINEZOLID 2 SENSITIVE Sensitive     * METHICILLIN RESISTANT STAPHYLOCOCCUS AUREUS  Blood Culture ID Panel (Reflexed)     Status: Abnormal   Collection Time: 12/05/22  8:57 PM  Result Value Ref Range Status   Enterococcus faecalis NOT DETECTED NOT DETECTED Final   Enterococcus Faecium NOT DETECTED NOT DETECTED Final   Listeria monocytogenes NOT DETECTED NOT DETECTED Final   Staphylococcus species DETECTED (A) NOT DETECTED Final    Comment: CRITICAL RESULT CALLED TO, READ BACK BY AND VERIFIED WITH: SHEEMA HALLAJI AT 1206 12/06/22.PMF    Staphylococcus aureus (BCID) DETECTED (A) NOT DETECTED Final    Comment: Methicillin (oxacillin)-resistant Staphylococcus aureus (MRSA). MRSA is predictably resistant to beta-lactam antibiotics (except ceftaroline). Preferred therapy is vancomycin unless clinically contraindicated. Patient requires contact precautions if  hospitalized. CRITICAL RESULT CALLED TO, READ BACK BY AND VERIFIED WITH: SHEEMA HALLAJI AT 1206 12/06/22.PMF    Staphylococcus epidermidis DETECTED (A) NOT DETECTED Final    Comment: CRITICAL RESULT CALLED TO, READ BACK BY AND VERIFIED WITH: SHEEMA HALLAJI AT 1206 12/06/22.PMF    Staphylococcus lugdunensis NOT DETECTED NOT DETECTED Final   Streptococcus species NOT DETECTED NOT DETECTED Final   Streptococcus agalactiae NOT DETECTED NOT DETECTED Final   Streptococcus pneumoniae NOT DETECTED NOT DETECTED Final   Streptococcus pyogenes NOT DETECTED NOT DETECTED Final   A.calcoaceticus-baumannii NOT DETECTED NOT DETECTED Final   Bacteroides fragilis NOT DETECTED NOT DETECTED Final   Enterobacterales NOT DETECTED NOT DETECTED Final   Enterobacter cloacae complex NOT DETECTED NOT DETECTED Final   Escherichia coli NOT DETECTED NOT DETECTED Final   Klebsiella aerogenes NOT DETECTED NOT DETECTED Final   Klebsiella oxytoca NOT DETECTED NOT DETECTED Final    Klebsiella pneumoniae NOT DETECTED NOT DETECTED Final   Proteus species NOT DETECTED NOT DETECTED Final   Salmonella species NOT DETECTED NOT DETECTED Final   Serratia marcescens NOT DETECTED NOT DETECTED Final   Haemophilus influenzae NOT DETECTED NOT DETECTED Final   Neisseria meningitidis NOT DETECTED NOT DETECTED Final   Pseudomonas aeruginosa NOT DETECTED NOT DETECTED Final   Stenotrophomonas maltophilia NOT DETECTED NOT DETECTED Final   Candida albicans NOT DETECTED NOT DETECTED Final   Candida auris NOT DETECTED NOT DETECTED Final   Candida glabrata NOT DETECTED NOT DETECTED Final   Candida krusei NOT DETECTED NOT DETECTED Final   Candida parapsilosis NOT DETECTED NOT DETECTED Final   Candida tropicalis NOT DETECTED NOT DETECTED Final   Cryptococcus neoformans/gattii NOT DETECTED NOT DETECTED Final   Methicillin resistance mecA/C DETECTED (A) NOT DETECTED Final    Comment: CRITICAL RESULT CALLED TO, READ BACK BY AND VERIFIED WITH: SHEEMA HALLAJI AT 1206 12/06/22.PMF    Meth resistant mecA/C and MREJ DETECTED (A) NOT DETECTED Final    Comment: CRITICAL RESULT CALLED TO, READ BACK BY AND VERIFIED WITH: SHEEMA HALLAJI AT 1206 12/06/22.PMF Performed at [facility], [address] [place], [place] [address]   Resp panel by RT-PCR (RSV, Flu A&B, Covid) Anterior Nasal Swab     Status: None   Collection Time: 12/05/22  9:53 PM   Specimen: Anterior Nasal Swab  Result Value Ref Range Status   SARS Coronavirus 2 by RT  PCR NEGATIVE NEGATIVE Final    Comment: (NOTE) SARS-CoV-2 target nucleic acids are NOT DETECTED.  The SARS-CoV-2 RNA is generally detectable in upper respiratory specimens during the acute phase of infection. The lowest concentration of SARS-CoV-2 viral copies this assay can detect is 138 copies/mL. A negative result does not preclude SARS-Cov-2 infection and should not be used as the sole basis for treatment or other patient management decisions. A negative  result may occur with  improper specimen collection/handling, submission of specimen other than nasopharyngeal swab, presence of viral mutation(s) within the areas targeted by this assay, and inadequate number of viral copies(<138 copies/mL). A negative result must be combined with clinical observations, patient history, and epidemiological information. The expected result is Negative.  Fact Sheet for Patients:  BloggerCourse.com  Fact Sheet for Healthcare Providers:  SeriousBroker.it  This test is no t yet approved or cleared by the [place] FDA and  has been authorized for detection and/or diagnosis of SARS-CoV-2 by FDA under an Emergency Use Authorization (EUA). This EUA will remain  in effect (meaning this test can be used) for the duration of the COVID-19 declaration under Section 564(b)(1) of the Act, 21 U.S.C.section 360bbb-3(b)(1), unless the authorization is terminated  or revoked sooner.       Influenza A by PCR NEGATIVE NEGATIVE Final   Influenza B by PCR NEGATIVE NEGATIVE Final    Comment: (NOTE) The Xpert Xpress SARS-CoV-2/FLU/RSV plus assay is intended as an aid in the diagnosis of influenza from Nasopharyngeal swab specimens and should not be used as a sole basis for treatment. Nasal washings and aspirates are unacceptable for Xpert Xpress SARS-CoV-2/FLU/RSV testing.  Fact Sheet for Patients: BloggerCourse.com  Fact Sheet for Healthcare Providers: SeriousBroker.it  This test is not yet approved or cleared by the [place] FDA and has been authorized for detection and/or diagnosis of SARS-CoV-2 by FDA under an Emergency Use Authorization (EUA). This EUA will remain in effect (meaning this test can be used) for the duration of the COVID-19 declaration under Section 564(b)(1) of the Act, 21 U.S.C. section 360bbb-3(b)(1), unless the authorization is  terminated or revoked.     Resp Syncytial Virus by PCR NEGATIVE NEGATIVE Final    Comment: (NOTE) Fact Sheet for Patients: BloggerCourse.com  Fact Sheet for Healthcare Providers: SeriousBroker.it  This test is not yet approved or cleared by the [place] FDA and has been authorized for detection and/or diagnosis of SARS-CoV-2 by FDA under an Emergency Use Authorization (EUA). This EUA will remain in effect (meaning this test can be used) for the duration of the COVID-19 declaration under Section 564(b)(1) of the Act, 21 U.S.C. section 360bbb-3(b)(1), unless the authorization is terminated or revoked.  Performed at [facility], [address] [place], [place] [address]   MRSA Next Gen by PCR, Nasal     Status: Abnormal   Collection Time: 12/06/22  4:27 AM   Specimen: Nasal Mucosa; Nasal Swab  Result Value Ref Range Status   MRSA by PCR Next Gen DETECTED (A) NOT DETECTED Final    Comment: RESULT CALLED TO, READ BACK BY AND VERIFIED WITH: KRISTINE CHAMBERS AT 2130 12/06/22.PMF (NOTE) The GeneXpert MRSA Assay (FDA approved for NASAL specimens only), is one component of a comprehensive MRSA colonization surveillance program. It is not intended to diagnose MRSA infection nor to guide or monitor treatment for MRSA infections. Test performance is not FDA approved in patients less than 48 years old. Performed at [facility], [address] [place],  [place] [address]   Cath Tip Culture     Status: Abnormal   Collection Time: 12/06/22  2:22 PM   Specimen: Catheter Tip; Other  Result Value Ref Range Status   Specimen Description   Final    CATH TIP Performed at [facility], [address] [place], [place] [address]    Special Requests   Final    NONE Performed at [facility], [address] [place], [place] [address]    Culture (A)  Final    >=100,000 COLONIES/mL METHICILLIN  RESISTANT STAPHYLOCOCCUS AUREUS   Report Status 12/08/2022 FINAL  Final   Organism ID, Bacteria METHICILLIN RESISTANT STAPHYLOCOCCUS AUREUS (A)  Final      Susceptibility   Methicillin resistant staphylococcus aureus - MIC*    CIPROFLOXACIN >=8 RESISTANT Resistant     ERYTHROMYCIN >=8 RESISTANT Resistant     GENTAMICIN <=0.5 SENSITIVE Sensitive     OXACILLIN >=4 RESISTANT Resistant     TETRACYCLINE <=1 SENSITIVE Sensitive     VANCOMYCIN <=0.5 SENSITIVE Sensitive     TRIMETH/SULFA >=320 RESISTANT Resistant     CLINDAMYCIN <=0.25 SENSITIVE Sensitive     RIFAMPIN <=0.5 SENSITIVE Sensitive     Inducible Clindamycin NEGATIVE Sensitive     LINEZOLID 2 SENSITIVE Sensitive     * >=100,000 COLONIES/mL METHICILLIN RESISTANT STAPHYLOCOCCUS AUREUS  Urine Culture (for pregnant, neutropenic or urologic patients or patients with an indwelling urinary catheter)     Status: Abnormal   Collection Time: 12/06/22  5:41 PM   Specimen: Urine, Clean Catch  Result Value Ref Range Status   Specimen Description   Final    URINE, CLEAN CATCH Performed at [facility], [address], [place], [place] [address]    Special Requests   Final    Normal Performed at [facility], [address] [place], [place] [address]    Culture (A)  Final    70,000 COLONIES/mL ENTEROCOCCUS FAECALIS 70,000 COLONIES/mL YEAST    Report Status 12/09/2022 FINAL  Final   Organism ID, Bacteria ENTEROCOCCUS FAECALIS (A)  Final      Susceptibility   Enterococcus faecalis - MIC*    AMPICILLIN <=2 SENSITIVE Sensitive     NITROFURANTOIN <=16 SENSITIVE Sensitive     VANCOMYCIN 1 SENSITIVE Sensitive     * 70,000 COLONIES/mL ENTEROCOCCUS FAECALIS  Culture, blood (Routine X 2) w Reflex to ID Panel     Status: None (Preliminary result)   Collection Time: 12/07/22  1:31 PM   Specimen: BLOOD  Result Value Ref Range Status   Specimen Description BLOOD BLOOD RIGHT HAND  Final   Special Requests   Final    BOTTLES  DRAWN AEROBIC ONLY Blood Culture adequate volume   Culture   Final    NO GROWTH 3 DAYS Performed at [facility], [address] [place], [place] [address]    Report Status PENDING  Incomplete  Culture, blood (Routine X 2) w Reflex to ID Panel     Status: None (Preliminary result)   Collection Time: 12/07/22  6:41 PM   Specimen: BLOOD  Result Value Ref Range Status   Specimen Description BLOOD BLOOD RIGHT HAND  Final   Special Requests   Final    BOTTLES DRAWN AEROBIC AND ANAEROBIC Blood Culture results may not be optimal due to an inadequate volume of blood received in culture bottles   Culture   Final    NO GROWTH 3 DAYS Performed at [facility], [address] [place], [place] [address]    Report Status PENDING  Incomplete    Coagulation Studies: No results for input(s): "LABPROT", "INR" in the last 72 hours.   Urinalysis: No results for input(s): "COLORURINE", "LABSPEC", "PHURINE", "GLUCOSEU", "HGBUR", "BILIRUBINUR", "KETONESUR", "PROTEINUR", "UROBILINOGEN", "NITRITE", "LEUKOCYTESUR" in the last 72 hours.  Invalid input(s): "APPERANCEUR"     Imaging: DG Abd 1 View  Result Date: 12/09/2022 CLINICAL DATA:  Abdominal distension, hypotension EXAM: ABDOMEN - 1 VIEW COMPARISON:  10/13/2022 FINDINGS: Three supine frontal views of the abdomen and pelvis are obtained. Pubic symphysis is excluded by collimation. Percutaneous gastrostomy tube overlies the central upper abdomen. The bowel gas pattern is unremarkable without obstruction or ileus. Stable 7 mm right renal calculus. Numerous canal vein phleboliths are again noted. No abdominal mass. IVC filter again noted. Degenerative changes of the lower lumbar spine and bilateral hips. Lung bases are clear. IMPRESSION: 1. Unremarkable bowel gas pattern. 2. Stable 7 mm right renal calculus. Electronically Signed   By: Sharlet Salina M.D.   On: 12/09/2022 22:18   US Carotid Bilateral  Result Date: 12/08/2022 CLINICAL  DATA:  TIA Hypertension Diabetes Former tobacco user EXAM: BILATERAL CAROTID DUPLEX ULTRASOUND TECHNIQUE: Wallace Cullens scale imaging, color Doppler and duplex ultrasound were performed of bilateral carotid and vertebral arteries in the neck. COMPARISON:  08/03/2013 FINDINGS: Criteria: Quantification of carotid stenosis is based on velocity parameters that correlate the residual internal carotid diameter with NASCET-based stenosis levels, using the diameter of the distal internal carotid lumen as the denominator for stenosis measurement. The following velocity measurements were obtained: RIGHT ICA: 75/11 cm/sec CCA: 94/16 cm/sec SYSTOLIC ICA/CCA RATIO:  0.8 ECA: 135 cm/sec LEFT ICA: 90/17 cm/sec CCA: 76/15 cm/sec SYSTOLIC ICA/CCA RATIO:  1.2 ECA: 60 cm/sec RIGHT CAROTID ARTERY: No significant atheromatous plaque. RIGHT VERTEBRAL ARTERY:  Antegrade flow. LEFT CAROTID ARTERY:  No significant atheromatous plaque. LEFT VERTEBRAL ARTERY:  Antegrade flow. IMPRESSION: No significant stenosis of internal carotid arteries. Electronically Signed   By: Acquanetta Belling M.D.   On: 12/08/2022 16:55     Medications:    sodium chloride Stopped (12/07/22 0210)   sodium chloride Stopped (12/08/22 1823)   [START ON 12/11/2022] copper chloride 2 mg in dextrose 5 % 100 mL IVPB     norepinephrine (LEVOPHED) Adult infusion 3 mcg/min (12/10/22 1000)    amiodarone  200 mg Per Tube BID   ascorbic acid  500 mg Per Tube BID   atorvastatin  40 mg Per Tube Daily   chlorhexidine  15 mL Mouth Rinse BID   Chlorhexidine Gluconate Cloth  6 each Topical Q0600   vitamin D3  2,000 Units Per Tube Daily   [START ON 12/18/2022] copper  2 mg Per Tube Daily   escitalopram  10 mg Per Tube Daily   famotidine  10 mg Per Tube Daily   feeding supplement (NEPRO CARB STEADY)  1,000 mL Per Tube Q24H   feeding supplement (PROSource TF20)  60 mL Per Tube Daily   free water  30 mL Per Tube Q4H   Gerhardt's butt cream   Topical BID   heparin injection  (subcutaneous)  5,000 Units Subcutaneous Q8H   hydrOXYzine  50 mg Per Tube Q6H   insulin aspart  0-6 Units Subcutaneous Q4H   midodrine  15 mg Per Tube TID WC   multivitamin  1 tablet Per Tube QHS   mupirocin ointment   Nasal BID   nutrition supplement (JUVEN)  1 packet Per Tube BID BM   mouth  rinse  15 mL Mouth Rinse 4 times per day   potassium & sodium phosphates  2 packet Per Tube BID   vancomycin variable dose per unstable renal function (pharmacist dosing)   Does not apply See admin instructions   sodium chloride, acetaminophen **OR** acetaminophen, artificial tears, clonazePAM, ipratropium-albuterol, loperamide HCl, magnesium hydroxide, ondansetron **OR** ondansetron (ZOFRAN) IV, mouth rinse, sennosides, traZODone  Assessment/ Plan:  Ms. Marguerita Steed is a 72 y.o.  female with past medical history of diabetes, COPD, GERD, hypertension. OA, a fib, and end stage renal disease on hemodialysis. Patient presents to the ED from her nursing facility complaining of altered mental status. She has been admitted for Lower urinary tract infectious disease [N39.0] Altered mental status, unspecified altered mental status type [R41.82] Sepsis due to gram-negative UTI (HCC) [A41.50, N39.0] Sepsis, due to unspecified organism, unspecified whether acute organ dysfunction present (HCC) [A41.9]  CK [facility]/TTS/Rt Permcath/81.3kg  End stage renal disease - no acute indication for dialysis a this time.  - holding dialysis at this time.  - anticipate restarting dialysis tomorrow.  - discussed case with critical care team.   2. Hypotension due to sepsis. Blood cultures and cath tip cultures with MRSA. Repeat cultures with no growth  - requiring vasopressors.   - continue antibiotics.   3. Anemia of chronic kidney disease : PRBC transfusion on 8/26. Hemoglobin 7.4  4. Secondary Hyperparathyroidism:  with hypophosphatemia.   - continue to replace phosphorus.      LOS: 4 Latoria Dry 8/28/202411:17 AM

## 2022-12-10 NOTE — Progress Notes (Signed)
NAME:  Alexandra Foster, MRN:  [id number], DOB:  [birth year], LOS: 4 ADMISSION DATE:  12/05/2022, CONSULTATION DATE:  12/06/2022 REFERRING MD:  Dr. Wynelle Link, CHIEF COMPLAINT:  Hypotension   Brief Pt Description / Synopsis:  72 y.o female with PMHx significant for ESRD on HD admitted with Acute Metabolic Encephalopathy in setting of Severe Sepsis with Septic Shock due to MRSA & Staph Epi Bacteremia (suspect from Right chest Permcath) along with UTI.  History of Present Illness:  Alexandra Foster is a 72 y.o. female with past medical history significant for asthma, COPD, type diabetes mellitus, s/p G-tube, GERD, ESRD on HD, hypertension, osteoarthritis, paroxysmal atrial fibrillation and OSA on CPAP, who presented to [facility] ED on 12/05/22 from [facility] due to altered mental status with lethargy and decreased responsiveness.  There was a concern about aspiration pneumonia at his facility as the patient had an episode of nausea and vomiting and was reportedly coughing per her sister without wheezing or dyspnea or hypoxia.  The patient's temperature was 102.2 when EMS arrived to this facility.  The patient was not responding to questions throughout transport.  EMS noted the patient having slurred speech and significant weakness per family (family had not seen her since last week).  Code stroke was called upon arrival to the ED. teleneurology evaluated the patient, was not considered a candidate for thrombolytics given unclear last known well time, along with low suspicion for stroke given significantly global symptoms and multiple metabolic derangements and sepsis contributing to encephalopathy.  12/09/22-patient with loose watery BM today, she does have skin breakdown over abd pannus.  She had re-initiation of her psychiatric medications today.  S/p pRBC transfusion overnight. Electrolytes are being repleted.  On room air but requires levophed still.   12/10/22- patient laying in bed with no acute  distress.  Levophed weaned to 33mcg/kg/min, afebrile on room air.  Handling NGT feeds well.  Purewick with no UOP this am. 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   Pertinent  Medical History   Past Medical History:  Diagnosis Date   AKI (acute kidney injury) (HCC)    a. 04/2021 in setting of bacteremia/shock.   Arthritis    Asthma    Bacteremia    a. 04/2021 S pyogenes bacteremia in setting of lower ext cellulitis.   COPD (chronic obstructive pulmonary disease) (HCC)    Diabetes mellitus without complication (HCC)    Endometriosis    GERD (gastroesophageal reflux disease)    History of echocardiogram    a. 07/2013 Echo: EF 55-60%, impaired relaxation, mild TR; b. 04/2021 Echo: EF 50-55%, mild LVH, nl RV fxn, mild BAE, Ao sclerosis w/o stenosis.   Hypertension    Obesity    PAF (paroxysmal atrial fibrillation) (HCC)    a. 04/2021 in setting of septic shock/cellulitis.   Sleep apnea    CPAP    Micro Data:  8/23: SARS-CoV-2/RSV/Flu PCR>>negative 8/23: Blood cultures>> 4/4 bottles with MRSA & Staph epidermidis 8/23: MRSA PCR>> positive 8/23: Urine>> 8/24: Permcath tip>>  Antimicrobials:   Anti-infectives (From admission, onward)    Start     Dose/Rate Route Frequency Ordered Stop   12/09/22 1800  vancomycin (VANCOREADY) IVPB 750 mg/150 mL        750 mg 150 mL/hr over 60 Minutes Intravenous  Once 12/09/22 1423 12/09/22 1848   12/08/22 1040  vancomycin variable dose per unstable renal function (pharmacist dosing)         Does not apply See admin instructions 12/08/22  1041     12/07/22 1400  vancomycin (VANCOCIN) IVPB 1000 mg/200 mL premix  Status:  Discontinued        1,000 mg 200 mL/hr over 60 Minutes Intravenous Every 24 hours 12/06/22 1234 12/08/22 1041   12/06/22 2200  cefTRIAXone (ROCEPHIN) 2 g in sodium chloride 0.9 % 100 mL IVPB  Status:  Discontinued        2 g 200 mL/hr over 30 Minutes Intravenous Every 24 hours 12/06/22 0132 12/06/22 1223   12/06/22 1500   ceFEPIme (MAXIPIME) 2 g in sodium chloride 0.9 % 100 mL IVPB  Status:  Discontinued        2 g 200 mL/hr over 30 Minutes Intravenous Every 12 hours 12/06/22 1252 12/08/22 1040   12/06/22 1315  vancomycin (VANCOREADY) IVPB 1750 mg/350 mL        1,750 mg 175 mL/hr over 120 Minutes Intravenous  Once 12/06/22 1224 12/06/22 1724   12/05/22 2130  cefTRIAXone (ROCEPHIN) 2 g in sodium chloride 0.9 % 100 mL IVPB  Status:  Discontinued        2 g 200 mL/hr over 30 Minutes Intravenous Every 24 hours 12/05/22 2115 12/06/22 0202   12/05/22 2130  azithromycin (ZITHROMAX) 500 mg in sodium chloride 0.9 % 250 mL IVPB  Status:  Discontinued        500 mg 250 mL/hr over 60 Minutes Intravenous Every 24 hours 12/05/22 2115 12/06/22 1223        Significant Hospital Events: Including procedures, antibiotic start and stop dates in addition to other pertinent events   8/23: Presented from [facility] due to AMS, Code Stroke called due to slurred speech and weakness. Deemed not a candidate for thrombolytics due to no clear last known normal time.  Admitted by [facility] with Nephrology consultation for dialysis. 8/24: Rapid response called due to Hypotension prior to/during Hemodialysis.  Transfer to ICU for peripheral vasopressors and initiation of CRRT.  PCCM consulted to assist with vasopressors.  Blood cultures + for MRSA & Staph Epi. ID consulted. Vascular Surgery consulted, permcath removed.  Temporary HD catheter placed. 8/24: this morning with pronounce left facial droop and dysarthria, remains on CRRT on Levo @8  keep MAP>55 12/08/22- patient with multispecies sepsis, removing central line today due to bacteremia.  Holding HD as per nephrology team due to shock.   Interim History / Subjective:  -See significant events  Objective   Blood pressure (!) 107/53, pulse 66, temperature 97.6 F (36.4 C), temperature source Oral, resp. rate 18, height 5\' 4"  (1.626 m), weight 89.5 kg, SpO2 93%.        Intake/Output  Summary (Last 24 hours) at 12/10/2022 0949 Last data filed at 12/10/2022 0400 Gross per 24 hour  Intake 1696.84 ml  Output 350 ml  Net 1346.84 ml   Filed Weights   12/08/22 0233 12/09/22 0430 12/10/22 0500  Weight: 90.3 kg 90.5 kg 89.5 kg    Examination: General: Acute on chronically ill-appearing obese female, laying in bed, on room air, no acute distress HENT: Atraumatic, normocephalic, neck supple, difficult to assess JVD due to body habitus Lungs: Diminished breath sounds throughout, even, nonlabored, normal effort Cardiovascular: Regular rate and rhythm, S1-S2, no murmurs, rubs, gallops Abdomen: Obese, soft, nontender, nondistended, no guarding or rebound tenderness, bowel sounds positive x 4 Extremities: Prior left AKA, 1+ edema to right lower extremity, no cyanosis Neuro: Lethargic, arouses easily to voice, oriented to person and place, moves extremities to command (left side slightly weaker, mild to moderate  dysarthria, left facial droop GU: Incontinent of urine  Resolved Hospital Problem list     Assessment & Plan:   #Septic Shock- present on admission due to blood stream infection with MRSA bacteremia -Severe Sepsis due to MRSA & Staph Epi Bacteremia (suspect due to Permcath) & UTI -Monitor fever curve -Trend WBC's & Procalcitonin -Follow cultures as above Vancomycin only- for MRSA -Obtain Echocardiogram -Right chest Permcath removed  -Consult ID, appreciate input- cultures re-drawn -on levophed 73mcg/kg/min -CT chest abdomen and pelvis today   Atrial fibrillation  Echocardiogram 05/16/21: LVEF 60-65%, mild LVH, Grade I DD, normal RV systolic function, mildly elevated pulmonary artery systolic pressure -Continuous cardiac monitoring -Maintain MAP >65 -Cautious IV fluids due to ESRD -Vasopressors as needed to maintain MAP goal -Continue Midodrine -Trend lactic acid until normalized -Trend HS Troponin until peaked -Cortisol is 35.9 -Echocardiogram  pending -Continue outpatient Amiodarone           -currently rate/rhythm controlled   #ESRD on Hemodialysis #Mild Hyponatremia -Monitor I&O's / urinary output -Follow BMP -Ensure adequate renal perfusion -Avoid nephrotoxic agents as able -Replace electrolytes as indicated ~ Pharmacy following for assistance with electrolyte replacement -Nephrology following, appreciate input: HD vs CRRT per recommendations  #COPD without acute exacerbation #OSA, uses CPAP at home -Supplemental O2 as needed to maintain O2 sats 88 to 92% -CPAP qhs -Follow intermittent Chest X-ray & ABG as needed -Bronchodilators prn -Pulmonary toilet as able  #Anemia of chronic disease -Monitor for S/Sx of bleeding -Trend CBC -Heparin SQ for VTE Prophylaxis  -Transfuse for Hgb <7  #Type 2 diabetes mellitus -CBG's q4h; Target range of 140 to 180 -SSI -Follow ICU Hypo/Hyperglycemia protocol -Hold outpatient metformin  #Acute Metabolic Encephalopathy in setting of sepsis and multiple metabolic derangements #?CVA PMHx: Depression and Anxiety CT Head 8/23 negative for acute intracranial abnormality Code stroke called 8/23, deemed not a candidate for thrombolytics given unclear last known well time -Treatment of Sepsis and metabolic derangements as outlined above -Provide supportive care -Promote normal sleep/wake cycle and family presence -Avoid sedating medications as able -Hold Klonopin, Buspar, Lexapro for now  Left sided weakness, Left Facial droop  -CTA head and neck reviewed and shows no acute intracranial abnormality or LVO -Check HgbA1c, fasting lipid panel -Obtain MRI  of the brain without contrast -PT consult, OT consult, Speech consult -Echocardiogram -Neurology consult placed    Best Practice (right click and "Reselect all SmartList Selections" daily)   Diet/type: NPO DVT prophylaxis: prophylactic heparin  GI prophylaxis: PPI Lines: right radial A-line, left femoral Trialysis (both  still needed) Foley:  N/A Code Status:  full code Last date of multidisciplinary goals of care discussion [N/A]  Labs   CBC: Recent Labs  Lab 12/05/22 2056 12/06/22 0404 12/07/22 0213 12/08/22 0525 12/08/22 4801 12/08/22 1656 12/08/22 2217 12/09/22 0417 12/10/22 0551  WBC 21.4*   < > 22.5* 34.6* 34.5*  --   --  33.7* 38.8*  NEUTROABS 19.4*  --   --   --   --   --   --   --   --   HGB 10.2*   < > 8.8* 6.7* 6.7* 7.7* 7.6* 7.4* 7.4*  HCT 33.4*   < > 28.3* 21.4* 21.7* 23.6* 23.2* 22.7* 23.1*  MCV 84.3   < > 83.0 82.6 83.8  --   --  83.5 85.6  PLT 411*   < > 293 242 256  --   --  213 244   < > = values in this  interval not displayed.    Basic Metabolic Panel: Recent Labs  Lab 12/07/22 0213 12/07/22 1608 12/07/22 1843 12/08/22 0525 12/08/22 0613 12/09/22 0417 12/09/22 1256 12/09/22 1835 12/10/22 0551  NA 132* 132*  --  130* 132* 132*  --   --  135  K 3.2* 3.9  --  3.6 3.7 2.9* 3.0* 4.2 3.5  CL 103 101  --  100 102 102  --   --  103  CO2 20* 21*  --  21* 20* 20*  --   --  20*  GLUCOSE 149* 211*  --  178* 184* 191*  --   --  179*  BUN 32* 26*  --  41* 38* 57*  --   --  89*  CREATININE 1.97* 1.82*  --  2.38* 2.57* 2.88*  --   --  3.31*  CALCIUM 8.7* 7.7*  --  8.2* 8.2* 8.4*  --   --  8.6*  MG 1.9  --  2.2 2.3 2.2 2.4  --   --   --   PHOS 2.5 1.8* 1.8* 1.7*  1.6* 1.7* 1.7* 1.1* 1.1* 1.1*   GFR: Estimated Creatinine Clearance: 16.1 mL/min (A) (by C-G formula based on SCr of 3.31 mg/dL (H)). Recent Labs  Lab 12/06/22 0016 12/06/22 0404 12/06/22 0734 12/06/22 0735 12/06/22 1257 12/07/22 0213 12/08/22 0525 12/08/22 0613 12/09/22 0417 12/10/22 0551  PROCALCITON  --  2.01  --   --   --  1.51 4.43  --  4.32  --   WBC  --  16.0*   < >  --   --  22.5* 34.6* 34.5* 33.7* 38.8*  LATICACIDVEN 2.9* 2.0*  --  1.9 1.7  --   --   --   --   --    < > = values in this interval not displayed.    Liver Function Tests: Recent Labs  Lab 12/05/22 2056 12/06/22 0354  12/07/22 0213 12/07/22 1608 12/08/22 0525 12/09/22 0417 12/10/22 0551  AST 66*  --   --   --   --   --   --   ALT 87*  --   --   --   --   --   --   ALKPHOS 157*  --   --   --   --   --   --   BILITOT 0.6  --   --   --   --   --   --   PROT 8.6*  --   --   --   --   --   --   ALBUMIN 3.3*   < > 1.9* 1.8* 2.0* 2.1* 2.2*   < > = values in this interval not displayed.   No results for input(s): "LIPASE", "AMYLASE" in the last 168 hours. No results for input(s): "AMMONIA" in the last 168 hours.  ABG    Component Value Date/Time   PHART 7.40 05/14/2021 0341   PCO2ART 45 05/14/2021 0341   PO2ART 129 (H) 05/14/2021 0341   HCO3 27.9 05/14/2021 0341   ACIDBASEDEF 12.8 (H) 05/11/2021 0603   O2SAT 98.9 05/14/2021 0341     Coagulation Profile: Recent Labs  Lab 12/05/22 2056 12/06/22 0404  INR 1.1 1.2    Cardiac Enzymes: No results for input(s): "CKTOTAL", "CKMB", "CKMBINDEX", "TROPONINI" in the last 168 hours.  HbA1C: Hgb A1c MFr Bld  Date/Time Value Ref Range Status  12/07/2022 02:13 AM 5.2 4.8 - 5.6 % Final  Comment:    (NOTE) Pre diabetes:          5.7%-6.4%  Diabetes:              >6.4%  Glycemic control for   <7.0% adults with diabetes   05/05/2021 04:53 AM 6.7 (H) 4.8 - 5.6 % Final    Comment:    (NOTE)         Prediabetes: 5.7 - 6.4         Diabetes: >6.4         Glycemic control for adults with diabetes: <7.0     CBG: Recent Labs  Lab 12/09/22 1551 12/09/22 1923 12/10/22 0004 12/10/22 0356 12/10/22 0802  GLUCAP 150* 129* 156* 177* 154*    Review of Systems:   Unable to assess due to AMS  Past Medical History:  She,  has a past medical history of AKI (acute kidney injury) (HCC), Arthritis, Asthma, Bacteremia, COPD (chronic obstructive pulmonary disease) (HCC), Diabetes mellitus without complication (HCC), Endometriosis, GERD (gastroesophageal reflux disease), History of echocardiogram, Hypertension, Obesity, PAF (paroxysmal atrial fibrillation)  (HCC), and Sleep apnea.   Surgical History:   Past Surgical History:  Procedure Laterality Date   ABDOMINAL HYSTERECTOMY     APPLICATION OF WOUND VAC Left 05/17/2021   Procedure: APPLICATION OF WOUND VAC/WOUND VAC EXCHANGE-Matrix Myriad;  Surgeon: Carolan Shiver, MD;  Location: [facility] ORS;  Service: General;  Laterality: Left;   APPLICATION OF WOUND VAC  05/24/2021   Procedure: APPLICATION OF WOUND VAC;  Surgeon: Carolan Shiver, MD;  Location: [facility] ORS;  Service: General;;   APPLICATION OF WOUND VAC  05/10/2021   Procedure: APPLICATION OF WOUND VAC;  Surgeon: Carolan Shiver, MD;  Location: [facility] ORS;  Service: General;;   COLONOSCOPY  10/29/2006   Dr Servando Snare   COLONOSCOPY WITH PROPOFOL N/A 11/05/2016   Procedure: COLONOSCOPY WITH PROPOFOL;  Surgeon: Earline Mayotte, MD;  Location: [facility] ENDOSCOPY;  Service: Endoscopy;  Laterality: N/A;   INCISION AND DRAINAGE OF WOUND Left 05/24/2021   Procedure: IRRIGATION AND DEBRIDEMENT LEFT LEG;  Surgeon: Carolan Shiver, MD;  Location: [facility] OR[facility];  Service: General;  Laterality: Left;   INCISION AND DRAINAGE OF WOUND Left 05/10/2021   Procedure: IRRIGATION AND DEBRIDEMENT LEFT LEG;  Surgeon: Carolan Shiver, MD;  Location: [facility] OR[facility];  Service: General;  Laterality: Left;   IR FLUORO GUIDE CV LINE RIGHT  06/12/2021   IR PERC TUN PERIT CATH WO PORT S&I /IMAG  06/12/2021   IR REPLC GASTRO/COLONIC TUBE PERCUT W/FLUORO  06/12/2021   IVC FILTER INSERTION N/A 05/29/2021   Procedure: IVC FILTER INSERTION;  Surgeon: Renford Dills, MD;  Location: [facility] INVASIVE [facility];  Service: Cardiovascular;  Laterality: N/A;   MINOR GRAFT APPLICATION  05/24/2021   Procedure: Myriad Matrix  APPLICATION;  Surgeon: Carolan Shiver, MD;  Location: [facility] OR[facility];  Service: General;;   NASAL SINUS SURGERY  2002   Dr Chestine Spore   PEG PLACEMENT N/A 05/28/2021   Procedure: PERCUTANEOUS ENDOSCOPIC GASTROSTOMY (PEG) PLACEMENT;  Surgeon: Sung Amabile, DO;  Location: [facility]  ENDOSCOPY;  Service: General;  Laterality: N/A;  TRAVEL CASE   TRACHEOSTOMY TUBE PLACEMENT N/A 05/17/2021   Procedure: TRACHEOSTOMY;  Surgeon: Geanie Logan, MD;  Location: [facility] ORS;  Service: ENT;  Laterality: N/A;   WOUND DEBRIDEMENT Left 05/07/2021   Procedure: DEBRIDEMENT WOUND;  Surgeon: Carolan Shiver, MD;  Location: [facility] ORS;  Service: General;  Laterality: Left;     Social History:   reports that she quit smoking about 21  years ago. Her smoking use included cigarettes. She started smoking about 61 years ago. She has a 80 pack-year smoking history. She quit smokeless tobacco use about 21 years ago.  Her smokeless tobacco use included snuff. She reports that she does not drink alcohol and does not use drugs.   Family History:  Her family history includes Alcohol abuse in her brother; Breast cancer (age of onset: 50) in her sister; Congestive Heart Failure in her mother; Coronary artery disease (age of onset: 55) in her father; Heart disease in her brother; Varicose Veins in her brother.   Allergies No Known Allergies   Home Medications  Prior to Admission medications   Medication Sig Start Date End Date Taking? Authorizing Provider  amiodarone (PACERONE) 200 MG tablet Place 1 tablet (200 mg total) into feeding tube 2 (two) times daily. 06/13/21  Yes Erin Fulling, MD  ascorbic acid (VITAMIN C) 500 MG tablet Place 1 tablet (500 mg total) into feeding tube 2 (two) times daily. 06/13/21  Yes Kasa, Wallis Bamberg, MD  busPIRone (BUSPAR) 5 MG tablet Take 5 mg by mouth 2 (two) times daily. 12/04/22  Yes [provider]  cholecalciferol (VITAMIN D) 25 MCG tablet Place 2 tablets (2,000 Units total) into feeding tube daily. 06/14/21  Yes Kasa, Wallis Bamberg, MD  clonazePAM (KLONOPIN) 0.5 MG tablet Take 0.25-0.5 mg by mouth 2 (two) times daily. 0.25 mg every morning and 0.5 mg at bedtime for Agitation 11/18/22  Yes [provider]  escitalopram (LEXAPRO) 10 MG tablet Place 1 tablet (10 mg total)  into feeding tube daily. 06/14/21  Yes Erin Fulling, MD  hydrOXYzine (ATARAX) 50 MG tablet Take 100 mg by mouth 2 (two) times daily. 12/02/22  Yes [provider]  lip balm (BLISTEX) OINT Apply 1 application topically 3 (three) times daily. 06/13/21  Yes Erin Fulling, MD  loperamide (IMODIUM) 2 MG capsule Take 1 capsule (2 mg total) by mouth as needed for diarrhea or loose stools. 06/13/21  Yes Erin Fulling, MD  midodrine (PROAMATINE) 5 MG tablet Place 3 tablets (15 mg total) into feeding tube 3 (three) times daily with meals. 06/13/21  Yes Erin Fulling, MD  multivitamin (RENA-VIT) TABS tablet Place 1 tablet into feeding tube at bedtime. 06/13/21  Yes Erin Fulling, MD  omeprazole (PRILOSEC) 20 MG capsule Take 20 mg by mouth daily. 11/12/22  Yes [provider]  sennosides (SENOKOT) 8.8 MG/5ML syrup Place 10 mLs into feeding tube at bedtime as needed for moderate constipation. 06/13/21  Yes Erin Fulling, MD  artificial tears (LACRILUBE) OINT ophthalmic ointment Place into both eyes every 4 (four) hours as needed for dry eyes. Patient not taking: Reported on 12/06/2022 06/13/21   Erin Fulling, MD  chlorhexidine gluconate, MEDLINE KIT, (PERIDEX) 0.12 % solution 15 mLs by Mouth Rinse route 2 (two) times daily. Patient not taking: Reported on 12/06/2022 06/13/21   Erin Fulling, MD  collagenase (SANTYL) ointment Apply topically daily. Patient not taking: Reported on 12/06/2022 06/14/21   Erin Fulling, MD  dextrose 5 % SOLN 100 mL with copper chloride 0.4 MG/ML SOLN 2 mg Inject 2 mg into the vein daily. 06/14/21   Erin Fulling, MD  ipratropium-albuterol (DUONEB) 0.5-2.5 (3) MG/3ML SOLN Take 3 mLs by nebulization 3 (three) times daily. Patient not taking: Reported on 12/06/2022 06/13/21   Erin Fulling, MD  lactobacillus (FLORANEX/LACTINEX) PACK Place 1 packet (1 g total) into feeding tube 3 (three) times daily with meals. Patient not taking: Reported on 12/06/2022 06/13/21   Erin Fulling,  MD  pantoprazole sodium  (PROTONIX) 40 mg Place 40 mg into feeding tube daily. Patient not taking: Reported on 12/06/2022 06/13/21   Erin Fulling, MD  Scheduled Meds:  amiodarone  200 mg Per Tube BID   ascorbic acid  500 mg Per Tube BID   atorvastatin  40 mg Per Tube Daily   chlorhexidine  15 mL Mouth Rinse BID   Chlorhexidine Gluconate Cloth  6 each Topical Q0600   vitamin D3  2,000 Units Per Tube Daily   escitalopram  10 mg Per Tube Daily   famotidine  10 mg Per Tube Daily   feeding supplement (NEPRO CARB STEADY)  1,000 mL Per Tube Q24H   feeding supplement (PROSource TF20)  60 mL Per Tube Daily   free water  30 mL Per Tube Q4H   Gerhardt's butt cream   Topical BID   heparin injection (subcutaneous)  5,000 Units Subcutaneous Q8H   hydrocortisone sod succinate (SOLU-CORTEF) inj  50 mg Intravenous Q6H   hydrOXYzine  50 mg Per Tube Q6H   insulin aspart  0-6 Units Subcutaneous Q4H   midodrine  15 mg Per Tube TID WC   multivitamin  1 tablet Per Tube QHS   mupirocin ointment   Nasal BID   nutrition supplement (JUVEN)  1 packet Per Tube BID BM   mouth rinse  15 mL Mouth Rinse 4 times per day   potassium & sodium phosphates  2 packet Per Tube BID   vancomycin variable dose per unstable renal function (pharmacist dosing)   Does not apply See admin instructions   Continuous Infusions:  sodium chloride Stopped (12/07/22 0210)   sodium chloride Stopped (12/08/22 1823)   norepinephrine (LEVOPHED) Adult infusion 4 mcg/min (12/10/22 0400)   PRN Meds:.sodium chloride, acetaminophen **OR** acetaminophen, artificial tears, clonazePAM, ipratropium-albuterol, loperamide HCl, magnesium hydroxide, ondansetron **OR** ondansetron (ZOFRAN) IV, mouth rinse, sennosides, traZODone   Critical care provider statement:   Total critical care time: 33 minutes   Performed by: Karna Christmas MD   Critical care time was exclusive of separately billable procedures and treating other patients.   Critical care was necessary to treat or  prevent imminent or life-threatening deterioration.   Critical care was time spent personally by me on the following activities: development of treatment plan with patient and/or surrogate as well as nursing, discussions with consultants, evaluation of patient's response to treatment, examination of patient, obtaining history from patient or surrogate, ordering and performing treatments and interventions, ordering and review of laboratory studies, ordering and review of radiographic studies, pulse oximetry and re-evaluation of patient's condition.    Vida Rigger, M.D.  Pulmonary & Critical Care Medicine

## 2022-12-11 DIAGNOSIS — A419 Sepsis, unspecified organism: Secondary | ICD-10-CM | POA: Diagnosis not present

## 2022-12-11 DIAGNOSIS — A4102 Sepsis due to Methicillin resistant Staphylococcus aureus: Secondary | ICD-10-CM | POA: Diagnosis not present

## 2022-12-11 DIAGNOSIS — Z7189 Other specified counseling: Secondary | ICD-10-CM | POA: Diagnosis not present

## 2022-12-11 DIAGNOSIS — R6521 Severe sepsis with septic shock: Secondary | ICD-10-CM | POA: Diagnosis not present

## 2022-12-11 LAB — VANCOMYCIN, RANDOM: Vancomycin Rm: 22 ug/mL

## 2022-12-11 LAB — VITAMIN A: Vitamin A (Retinoic Acid): 24.9 ug/dL (ref 22.0–69.5)

## 2022-12-11 LAB — CBC
HCT: 25.3 % — ABNORMAL LOW (ref 36.0–46.0)
Hemoglobin: 8 g/dL — ABNORMAL LOW (ref 12.0–15.0)
MCH: 27.5 pg (ref 26.0–34.0)
MCHC: 31.6 g/dL (ref 30.0–36.0)
MCV: 86.9 fL (ref 80.0–100.0)
Platelets: 301 10*3/uL (ref 150–400)
RBC: 2.91 MIL/uL — ABNORMAL LOW (ref 3.87–5.11)
RDW: 19 % — ABNORMAL HIGH (ref 11.5–15.5)
WBC: 36.8 10*3/uL — ABNORMAL HIGH (ref 4.0–10.5)
nRBC: 4.2 % — ABNORMAL HIGH (ref 0.0–0.2)

## 2022-12-11 LAB — GLUCOSE, CAPILLARY
Glucose-Capillary: 134 mg/dL — ABNORMAL HIGH (ref 70–99)
Glucose-Capillary: 126 mg/dL — ABNORMAL HIGH (ref 70–99)
Glucose-Capillary: 116 mg/dL — ABNORMAL HIGH (ref 70–99)
Glucose-Capillary: 107 mg/dL — ABNORMAL HIGH (ref 70–99)
Glucose-Capillary: 111 mg/dL — ABNORMAL HIGH (ref 70–99)

## 2022-12-11 LAB — HEPATITIS B SURFACE ANTIGEN: Hepatitis B Surface Ag: NONREACTIVE

## 2022-12-11 LAB — RENAL FUNCTION PANEL
Albumin: 2.1 g/dL — ABNORMAL LOW (ref 3.5–5.0)
Anion gap: 11 (ref 5–15)
BUN: 99 mg/dL — ABNORMAL HIGH (ref 8–23)
CO2: 23 mmol/L (ref 22–32)
Calcium: 8.9 mg/dL (ref 8.9–10.3)
Chloride: 105 mmol/L (ref 98–111)
Creatinine, Ser: 3.57 mg/dL — ABNORMAL HIGH (ref 0.44–1.00)
GFR, Estimated: 13 mL/min — ABNORMAL LOW (ref >60)
Glucose, Bld: 132 mg/dL — ABNORMAL HIGH (ref 70–99)
Phosphorus: 1.7 mg/dL — ABNORMAL LOW (ref 2.5–4.6)
Potassium: 2.9 mmol/L — ABNORMAL LOW (ref 3.5–5.1)
Sodium: 139 mmol/L (ref 135–145)

## 2022-12-11 LAB — MAGNESIUM: Magnesium: 2.2 mg/dL (ref 1.7–2.4)

## 2022-12-11 LAB — VITAMIN E
Vitamin E (Alpha Tocopherol): 11.6 mg/L (ref 9.0–29.0)
Vitamin E(Gamma Tocopherol): 0.5 mg/L (ref 0.5–4.9)

## 2022-12-11 MED ORDER — LIDOCAINE HCL (PF) 1 % IJ SOLN: 5.0000 mL | INTRAMUSCULAR | Status: DC | PRN

## 2022-12-11 MED ORDER — SODIUM CHLORIDE 0.9 % IV SOLN
3.0000 g | Freq: Two times a day (BID) | INTRAVENOUS | Status: DC
  Administered 2022-12-12 – 2022-12-15 (×8): 3 g via INTRAVENOUS
  Filled 2022-12-11 (×9): qty 8

## 2022-12-11 MED ORDER — ALTEPLASE 2 MG IJ SOLR: 2.0000 mg | Freq: Once | INTRAMUSCULAR | Status: DC | PRN

## 2022-12-11 MED ORDER — FENTANYL CITRATE PF 50 MCG/ML IJ SOSY
12.5000 ug | PREFILLED_SYRINGE | INTRAMUSCULAR | Status: DC | PRN
  Administered 2022-12-11: 12.5 ug via INTRAVENOUS
  Filled 2022-12-11: qty 1

## 2022-12-11 MED ORDER — NEPRO/CARBSTEADY PO LIQD: 237.0000 mL | ORAL | Status: DC | PRN

## 2022-12-11 MED ORDER — LIDOCAINE-PRILOCAINE 2.5-2.5 % EX CREA: 1.0000 | TOPICAL_CREAM | CUTANEOUS | Status: DC | PRN

## 2022-12-11 MED ORDER — POTASSIUM & SODIUM PHOSPHATES 280-160-250 MG PO PACK
2.0000 | PACK | Freq: Two times a day (BID) | ORAL | Status: AC
  Administered 2022-12-11 (×2): 2
  Filled 2022-12-11 (×2): qty 2

## 2022-12-11 MED ORDER — HEPARIN SODIUM (PORCINE) 1000 UNIT/ML DIALYSIS
1000.0000 [IU] | INTRAMUSCULAR | Status: DC | PRN
  Administered 2022-12-11: 3600 [IU]
  Administered 2022-12-16: 1000 [IU]
  Administered 2022-12-21 – 2022-12-25 (×2): 4600 [IU]
  Administered 2022-12-30: 1000 [IU]
  Filled 2022-12-11 (×5): qty 1

## 2022-12-11 MED ORDER — POTASSIUM CHLORIDE 20 MEQ PO PACK
40.0000 meq | PACK | Freq: Once | ORAL | Status: AC
  Administered 2022-12-11: 40 meq
  Filled 2022-12-11: qty 2

## 2022-12-11 MED ORDER — MIDODRINE HCL 5 MG PO TABS
5.0000 mg | ORAL_TABLET | Freq: Once | ORAL | Status: AC
  Administered 2022-12-11: 5 mg
  Filled 2022-12-11: qty 1

## 2022-12-11 MED ORDER — MIDAZOLAM HCL 2 MG/2ML IJ SOLN: 2.0000 mg | Freq: Once | INTRAMUSCULAR | Status: AC

## 2022-12-11 MED ORDER — HEPARIN SODIUM (PORCINE) 1000 UNIT/ML DIALYSIS
1000.0000 [IU] | INTRAMUSCULAR | Status: DC | PRN
  Administered 2022-12-14: 3600 [IU]
  Filled 2022-12-11 (×3): qty 1

## 2022-12-11 MED ORDER — MIDAZOLAM HCL 2 MG/2ML IJ SOLN
INTRAMUSCULAR | Status: AC
  Administered 2022-12-11: 2 mg via INTRAVENOUS
  Filled 2022-12-11: qty 2

## 2022-12-11 MED ORDER — FENTANYL CITRATE PF 50 MCG/ML IJ SOSY
12.5000 ug | PREFILLED_SYRINGE | INTRAMUSCULAR | Status: DC | PRN
  Administered 2022-12-11: 25 ug via INTRAVENOUS
  Administered 2022-12-13: 12.5 ug via INTRAVENOUS
  Administered 2022-12-18: 25 ug via INTRAVENOUS
  Filled 2022-12-11 (×3): qty 1

## 2022-12-11 MED ORDER — VANCOMYCIN HCL IN DEXTROSE 1-5 GM/200ML-% IV SOLN
1000.0000 mg | Freq: Once | INTRAVENOUS | Status: AC
  Administered 2022-12-11: 1000 mg via INTRAVENOUS
  Filled 2022-12-11: qty 200

## 2022-12-11 MED ORDER — CHLORHEXIDINE GLUCONATE CLOTH 2 % EX PADS
6.0000 | MEDICATED_PAD | Freq: Every day | CUTANEOUS | Status: DC
  Administered 2022-12-12 – 2023-01-03 (×22): 6 via TOPICAL

## 2022-12-11 MED ORDER — ANTICOAGULANT SODIUM CITRATE 4% (200MG/5ML) IV SOLN: 5.0000 mL | Status: DC | PRN

## 2022-12-11 MED ORDER — PENTAFLUOROPROP-TETRAFLUOROETH EX AERO: 1.0000 | INHALATION_SPRAY | CUTANEOUS | Status: DC | PRN

## 2022-12-11 MED ORDER — DROXIDOPA 100 MG PO CAPS
200.0000 mg | ORAL_CAPSULE | Freq: Three times a day (TID) | ORAL | Status: DC
  Administered 2022-12-11 – 2022-12-15 (×13): 200 mg
  Filled 2022-12-11 (×14): qty 2

## 2022-12-11 NOTE — Progress Notes (Signed)
NAME:  Alexandra Foster, MRN:  [id number], DOB:  [birth year], LOS: 5 ADMISSION DATE:  12/05/2022, CONSULTATION DATE:  12/06/2022 REFERRING MD:  Dr. Wynelle Link, CHIEF COMPLAINT:  Hypotension   Brief Pt Description / Synopsis:  72 y.o female with PMHx significant for ESRD on HD admitted with Acute Metabolic Encephalopathy in setting of Severe Sepsis with Septic Shock due to MRSA & Staph Epi Bacteremia (suspect from Right chest Permcath) along with UTI.  History of Present Illness:  Alexandra Foster is a 72 y.o. female with past medical history significant for asthma, COPD, type diabetes mellitus, s/p G-tube, GERD, ESRD on HD, hypertension, osteoarthritis, paroxysmal atrial fibrillation and OSA on CPAP, who presented to [facility] ED on 12/05/22 from [facility] due to altered mental status with lethargy and decreased responsiveness.  There was a concern about aspiration pneumonia at his facility as the patient had an episode of nausea and vomiting and was reportedly coughing per her sister without wheezing or dyspnea or hypoxia.  The patient's temperature was 102.2 when EMS arrived to this facility.  The patient was not responding to questions throughout transport.  EMS noted the patient having slurred speech and significant weakness per family (family had not seen her since last week).  Code stroke was called upon arrival to the ED. teleneurology evaluated the patient, was not considered a candidate for thrombolytics given unclear last known well time, along with low suspicion for stroke given significantly global symptoms and multiple metabolic derangements and sepsis contributing to encephalopathy.  12/09/22-patient with loose watery BM today, she does have skin breakdown over abd pannus.  She had re-initiation of her psychiatric medications today.  S/p pRBC transfusion overnight. Electrolytes are being repleted.  On room air but requires levophed still.   12/10/22- patient laying in bed with no acute  distress.  Levophed weaned to 23mcg/kg/min, afebrile on room air.  Handling NGT feeds well.  Purewick with no UOP this am. 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   12/11/22- patient off levophed , requires HD today, will place catheter per renal team.  Wound care to see for anal wound.    Pertinent  Medical History   Past Medical History:  Diagnosis Date   AKI (acute kidney injury) (HCC)    a. 04/2021 in setting of bacteremia/shock.   Arthritis    Asthma    Bacteremia    a. 04/2021 S pyogenes bacteremia in setting of lower ext cellulitis.   COPD (chronic obstructive pulmonary disease) (HCC)    Diabetes mellitus without complication (HCC)    Endometriosis    GERD (gastroesophageal reflux disease)    History of echocardiogram    a. 07/2013 Echo: EF 55-60%, impaired relaxation, mild TR; b. 04/2021 Echo: EF 50-55%, mild LVH, nl RV fxn, mild BAE, Ao sclerosis w/o stenosis.   Hypertension    Obesity    PAF (paroxysmal atrial fibrillation) (HCC)    a. 04/2021 in setting of septic shock/cellulitis.   Sleep apnea    CPAP    Micro Data:  8/23: SARS-CoV-2/RSV/Flu PCR>>negative 8/23: Blood cultures>> 4/4 bottles with MRSA & Staph epidermidis 8/23: MRSA PCR>> positive 8/23: Urine>> 8/24: Permcath tip>>  Antimicrobials:   Anti-infectives (From admission, onward)    Start     Dose/Rate Route Frequency Ordered Stop   12/09/22 1800  vancomycin (VANCOREADY) IVPB 750 mg/150 mL        750 mg 150 mL/hr over 60 Minutes Intravenous  Once 12/09/22 1423 12/09/22 1848   12/08/22 1040  vancomycin variable dose per unstable renal function (pharmacist dosing)         Does not apply See admin instructions 12/08/22 1041     12/07/22 1400  vancomycin (VANCOCIN) IVPB 1000 mg/200 mL premix  Status:  Discontinued        1,000 mg 200 mL/hr over 60 Minutes Intravenous Every 24 hours 12/06/22 1234 12/08/22 1041   12/06/22 2200  cefTRIAXone (ROCEPHIN) 2 g in sodium chloride 0.9 % 100 mL IVPB  Status:   Discontinued        2 g 200 mL/hr over 30 Minutes Intravenous Every 24 hours 12/06/22 0132 12/06/22 1223   12/06/22 1500  ceFEPIme (MAXIPIME) 2 g in sodium chloride 0.9 % 100 mL IVPB  Status:  Discontinued        2 g 200 mL/hr over 30 Minutes Intravenous Every 12 hours 12/06/22 1252 12/08/22 1040   12/06/22 1315  vancomycin (VANCOREADY) IVPB 1750 mg/350 mL        1,750 mg 175 mL/hr over 120 Minutes Intravenous  Once 12/06/22 1224 12/06/22 1724   12/05/22 2130  cefTRIAXone (ROCEPHIN) 2 g in sodium chloride 0.9 % 100 mL IVPB  Status:  Discontinued        2 g 200 mL/hr over 30 Minutes Intravenous Every 24 hours 12/05/22 2115 12/06/22 0202   12/05/22 2130  azithromycin (ZITHROMAX) 500 mg in sodium chloride 0.9 % 250 mL IVPB  Status:  Discontinued        500 mg 250 mL/hr over 60 Minutes Intravenous Every 24 hours 12/05/22 2115 12/06/22 1223        Significant Hospital Events: Including procedures, antibiotic start and stop dates in addition to other pertinent events   8/23: Presented from [facility] due to AMS, Code Stroke called due to slurred speech and weakness. Deemed not a candidate for thrombolytics due to no clear last known normal time.  Admitted by [facility] with Nephrology consultation for dialysis. 8/24: Rapid response called due to Hypotension prior to/during Hemodialysis.  Transfer to ICU for peripheral vasopressors and initiation of CRRT.  PCCM consulted to assist with vasopressors.  Blood cultures + for MRSA & Staph Epi. ID consulted. Vascular Surgery consulted, permcath removed.  Temporary HD catheter placed. 8/24: this morning with pronounce left facial droop and dysarthria, remains on CRRT on Levo @8  keep MAP>55 12/08/22- patient with multispecies sepsis, removing central line today due to bacteremia.  Holding HD as per nephrology team due to shock.   Interim History / Subjective:  -See significant events  Objective   Blood pressure (!) 82/41, pulse 64, temperature 98.3 F  (36.8 C), resp. rate 18, height 5\' 4"  (1.626 m), weight 90 kg, SpO2 98%.        Intake/Output Summary (Last 24 hours) at 12/11/2022 1014 Last data filed at 12/11/2022 0300 Gross per 24 hour  Intake 1404.97 ml  Output 1050 ml  Net 354.97 ml   Filed Weights   12/09/22 0430 12/10/22 0500 12/11/22 0420  Weight: 90.5 kg 89.5 kg 90 kg    Examination: General: Acute on chronically ill-appearing obese female, laying in bed, on room air, no acute distress HENT: Atraumatic, normocephalic, neck supple, difficult to assess JVD due to body habitus Lungs: Diminished breath sounds throughout, even, nonlabored, normal effort Cardiovascular: Regular rate and rhythm, S1-S2, no murmurs, rubs, gallops Abdomen: Obese, soft, nontender, nondistended, no guarding or rebound tenderness, bowel sounds positive x 4 Extremities: Prior left AKA, 1+ edema to right lower extremity, no cyanosis Neuro:  Lethargic, arouses easily to voice, oriented to person and place, moves extremities to command (left side slightly weaker, mild to moderate dysarthria, left facial droop GU: Incontinent of urine  Resolved Hospital Problem list     Assessment & Plan:   #Septic Shock- present on admission due to blood stream infection with MRSA bacteremia -Severe Sepsis due to MRSA & Staph Epi Bacteremia (suspect due to Permcath) & UTI -Monitor fever curve -Trend WBC's & Procalcitonin -Follow cultures as above Vancomycin only- for MRSA -Obtain Echocardiogram -Right chest Permcath removed  -Consult ID, appreciate input- cultures re-drawn -on levophed 60mcg/kg/min -CT chest abdomen and pelvis today   Atrial fibrillation  Echocardiogram 05/16/21: LVEF 60-65%, mild LVH, Grade I DD, normal RV systolic function, mildly elevated pulmonary artery systolic pressure -Continuous cardiac monitoring -Maintain MAP >65 -Cautious IV fluids due to ESRD -Vasopressors as needed to maintain MAP goal -Continue Midodrine -Trend lactic acid until  normalized -Trend HS Troponin until peaked -Cortisol is 35.9 -Echocardiogram pending -Continue outpatient Amiodarone           -currently rate/rhythm controlled   Anal wound with skin breakdown     - flexiseal tube is not working due to sorrounding edema and mucosal breakdown   - will place consult for wound care evaluation and consider surgical eval.  #ESRD on Hemodialysis #Mild Hyponatremia -Monitor I&O's / urinary output -Follow BMP -Ensure adequate renal perfusion -Avoid nephrotoxic agents as able -Replace electrolytes as indicated ~ Pharmacy following for assistance with electrolyte replacement -Nephrology following, appreciate input: HD vs CRRT per recommendations  #COPD without acute exacerbation #OSA, uses CPAP at home -Supplemental O2 as needed to maintain O2 sats 88 to 92% -CPAP qhs -Follow intermittent Chest X-ray & ABG as needed -Bronchodilators prn -Pulmonary toilet as able  #Anemia of chronic disease -Monitor for S/Sx of bleeding -Trend CBC -Heparin SQ for VTE Prophylaxis  -Transfuse for Hgb <7  #Type 2 diabetes mellitus -CBG's q4h; Target range of 140 to 180 -SSI -Follow ICU Hypo/Hyperglycemia protocol -Hold outpatient metformin  #Acute Metabolic Encephalopathy in setting of sepsis and multiple metabolic derangements #?CVA PMHx: Depression and Anxiety CT Head 8/23 negative for acute intracranial abnormality Code stroke called 8/23, deemed not a candidate for thrombolytics given unclear last known well time -Treatment of Sepsis and metabolic derangements as outlined above -Provide supportive care -Promote normal sleep/wake cycle and family presence -Avoid sedating medications as able -Hold Klonopin, Buspar, Lexapro for now  Left sided weakness, Left Facial droop  -CTA head and neck reviewed and shows no acute intracranial abnormality or LVO -Check HgbA1c, fasting lipid panel -Obtain MRI  of the brain without contrast -PT consult, OT consult,  Speech consult -Echocardiogram -Neurology consult placed    Best Practice (right click and "Reselect all SmartList Selections" daily)   Diet/type: NPO DVT prophylaxis: prophylactic heparin  GI prophylaxis: PPI Lines: right radial A-line, left femoral Trialysis (both still needed) Foley:  N/A Code Status:  full code Last date of multidisciplinary goals of care discussion [N/A]  Labs   CBC: Recent Labs  Lab 12/05/22 2056 12/06/22 0404 12/08/22 0525 12/08/22 5573 12/08/22 1656 12/08/22 2217 12/09/22 0417 12/10/22 0551 12/11/22 0557  WBC 21.4*   < > 34.6* 34.5*  --   --  33.7* 38.8* 36.8*  NEUTROABS 19.4*  --   --   --   --   --   --   --   --   HGB 10.2*   < > 6.7* 6.7* 7.7* 7.6* 7.4* 7.4* 8.0*  HCT 33.4*   < > 21.4* 21.7* 23.6* 23.2* 22.7* 23.1* 25.3*  MCV 84.3   < > 82.6 83.8  --   --  83.5 85.6 86.9  PLT 411*   < > 242 256  --   --  213 244 301   < > = values in this interval not displayed.    Basic Metabolic Panel: Recent Labs  Lab 12/07/22 1843 12/08/22 0525 12/08/22 0613 12/09/22 0417 12/09/22 1256 12/09/22 1835 12/10/22 0551 12/11/22 0557  NA  --  130* 132* 132*  --   --  135 139  K  --  3.6 3.7 2.9* 3.0* 4.2 3.5 2.9*  CL  --  100 102 102  --   --  103 105  CO2  --  21* 20* 20*  --   --  20* 23  GLUCOSE  --  178* 184* 191*  --   --  179* 132*  BUN  --  41* 38* 57*  --   --  89* 99*  CREATININE  --  2.38* 2.57* 2.88*  --   --  3.31* 3.57*  CALCIUM  --  8.2* 8.2* 8.4*  --   --  8.6* 8.9  MG 2.2 2.3 2.2 2.4  --   --   --  2.2  PHOS 1.8* 1.7*  1.6* 1.7* 1.7* 1.1* 1.1* 1.1* 1.7*   GFR: Estimated Creatinine Clearance: 15 mL/min (A) (by C-G formula based on SCr of 3.57 mg/dL (H)). Recent Labs  Lab 12/06/22 0016 12/06/22 0404 12/06/22 0734 12/06/22 0735 12/06/22 1257 12/07/22 0213 12/08/22 0525 12/08/22 1610 12/09/22 0417 12/10/22 0551 12/11/22 0557  PROCALCITON  --  2.01  --   --   --  1.51 4.43  --  4.32  --   --   WBC  --  16.0*   < >  --    --  22.5* 34.6* 34.5* 33.7* 38.8* 36.8*  LATICACIDVEN 2.9* 2.0*  --  1.9 1.7  --   --   --   --   --   --    < > = values in this interval not displayed.    Liver Function Tests: Recent Labs  Lab 12/05/22 2056 12/06/22 0354 12/07/22 1608 12/08/22 0525 12/09/22 0417 12/10/22 0551 12/11/22 0557  AST 66*  --   --   --   --   --   --   ALT 87*  --   --   --   --   --   --   ALKPHOS 157*  --   --   --   --   --   --   BILITOT 0.6  --   --   --   --   --   --   PROT 8.6*  --   --   --   --   --   --   ALBUMIN 3.3*   < > 1.8* 2.0* 2.1* 2.2* 2.1*   < > = values in this interval not displayed.   No results for input(s): "LIPASE", "AMYLASE" in the last 168 hours. No results for input(s): "AMMONIA" in the last 168 hours.  ABG    Component Value Date/Time   PHART 7.40 05/14/2021 0341   PCO2ART 45 05/14/2021 0341   PO2ART 129 (H) 05/14/2021 0341   HCO3 27.9 05/14/2021 0341   ACIDBASEDEF 12.8 (H) 05/11/2021 0603   O2SAT 98.9 05/14/2021 0341     Coagulation Profile: Recent Labs  Lab 12/05/22 2056 12/06/22 0404  INR 1.1 1.2    Cardiac Enzymes: No results for input(s): "CKTOTAL", "CKMB", "CKMBINDEX", "TROPONINI" in the last 168 hours.  HbA1C: Hgb A1c MFr Bld  Date/Time Value Ref Range Status  12/07/2022 02:13 AM 5.2 4.8 - 5.6 % Final    Comment:    (NOTE) Pre diabetes:          5.7%-6.4%  Diabetes:              >6.4%  Glycemic control for   <7.0% adults with diabetes   05/05/2021 04:53 AM 6.7 (H) 4.8 - 5.6 % Final    Comment:    (NOTE)         Prediabetes: 5.7 - 6.4         Diabetes: >6.4         Glycemic control for adults with diabetes: <7.0     CBG: Recent Labs  Lab 12/10/22 1612 12/10/22 2007 12/10/22 2352 12/11/22 0401 12/11/22 0743  GLUCAP 116* 107* 120* 134* 126*    Review of Systems:   Unable to assess due to AMS  Past Medical History:  She,  has a past medical history of AKI (acute kidney injury) (HCC), Arthritis, Asthma, Bacteremia, COPD  (chronic obstructive pulmonary disease) (HCC), Diabetes mellitus without complication (HCC), Endometriosis, GERD (gastroesophageal reflux disease), History of echocardiogram, Hypertension, Obesity, PAF (paroxysmal atrial fibrillation) (HCC), and Sleep apnea.   Surgical History:   Past Surgical History:  Procedure Laterality Date   ABDOMINAL HYSTERECTOMY     APPLICATION OF WOUND VAC Left 05/17/2021   Procedure: APPLICATION OF WOUND VAC/WOUND VAC EXCHANGE-Matrix Myriad;  Surgeon: Carolan Shiver, MD;  Location: [facility];  Service: General;  Laterality: Left;   APPLICATION OF WOUND VAC  05/24/2021   Procedure: APPLICATION OF WOUND VAC;  Surgeon: Carolan Shiver, MD;  Location: [facility];  Service: General;;   APPLICATION OF WOUND VAC  05/10/2021   Procedure: APPLICATION OF WOUND VAC;  Surgeon: Carolan Shiver, MD;  Location: [facility];  Service: General;;   COLONOSCOPY  10/29/2006   Dr Servando Snare   COLONOSCOPY WITH PROPOFOL N/A 11/05/2016   Procedure: COLONOSCOPY WITH PROPOFOL;  Surgeon: Earline Mayotte, MD;  Location: [facility];  Service: Endoscopy;  Laterality: N/A;   INCISION AND DRAINAGE OF WOUND Left 05/24/2021   Procedure: IRRIGATION AND DEBRIDEMENT LEFT LEG;  Surgeon: Carolan Shiver, MD;  Location: [facility];  Service: General;  Laterality: Left;   INCISION AND DRAINAGE OF WOUND Left 05/10/2021   Procedure: IRRIGATION AND DEBRIDEMENT LEFT LEG;  Surgeon: Carolan Shiver, MD;  Location: [facility];  Service: General;  Laterality: Left;   IR FLUORO GUIDE CV LINE RIGHT  06/12/2021   IR PERC TUN PERIT CATH WO PORT S&I /IMAG  06/12/2021   IR REPLC GASTRO/COLONIC TUBE PERCUT W/FLUORO  06/12/2021   IVC FILTER INSERTION N/A 05/29/2021   Procedure: IVC FILTER INSERTION;  Surgeon: Renford Dills, MD;  Location: [facility];  Service: Cardiovascular;  Laterality: N/A;   MINOR GRAFT APPLICATION  05/24/2021   Procedure: Myriad Matrix  APPLICATION;  Surgeon: Carolan Shiver, MD;  Location: [facility];  Service: General;;   NASAL SINUS SURGERY  2002   Dr Chestine Spore   PEG PLACEMENT N/A 05/28/2021   Procedure: PERCUTANEOUS ENDOSCOPIC GASTROSTOMY (PEG) PLACEMENT;  Surgeon: Sung Amabile, DO;  Location: [facility];  Service: General;  Laterality: N/A;  TRAVEL CASE   TRACHEOSTOMY TUBE PLACEMENT N/A 05/17/2021   Procedure: TRACHEOSTOMY;  Surgeon: Geanie Logan,  MD;  Location: [facility];  Service: ENT;  Laterality: N/A;   WOUND DEBRIDEMENT Left 05/07/2021   Procedure: DEBRIDEMENT WOUND;  Surgeon: Carolan Shiver, MD;  Location: [facility];  Service: General;  Laterality: Left;     Social History:   reports that she quit smoking about 21 years ago. Her smoking use included cigarettes. She started smoking about 61 years ago. She has a 80 pack-year smoking history. She quit smokeless tobacco use about 21 years ago.  Her smokeless tobacco use included snuff. She reports that she does not drink alcohol and does not use drugs.   Family History:  Her family history includes Alcohol abuse in her brother; Breast cancer (age of onset: 5) in her sister; Congestive Heart Failure in her mother; Coronary artery disease (age of onset: 79) in her father; Heart disease in her brother; Varicose Veins in her brother.   Allergies No Known Allergies   Home Medications  Prior to Admission medications   Medication Sig Start Date End Date Taking? Authorizing Provider  amiodarone (PACERONE) 200 MG tablet Place 1 tablet (200 mg total) into feeding tube 2 (two) times daily. 06/13/21  Yes Erin Fulling, MD  ascorbic acid (VITAMIN C) 500 MG tablet Place 1 tablet (500 mg total) into feeding tube 2 (two) times daily. 06/13/21  Yes Kasa, Wallis Bamberg, MD  busPIRone (BUSPAR) 5 MG tablet Take 5 mg by mouth 2 (two) times daily. 12/04/22  Yes [provider]  cholecalciferol (VITAMIN D) 25 MCG tablet Place 2 tablets (2,000 Units total) into feeding tube daily. 06/14/21  Yes Kasa, Wallis Bamberg, MD  clonazePAM  (KLONOPIN) 0.5 MG tablet Take 0.25-0.5 mg by mouth 2 (two) times daily. 0.25 mg every morning and 0.5 mg at bedtime for Agitation 11/18/22  Yes [provider]  escitalopram (LEXAPRO) 10 MG tablet Place 1 tablet (10 mg total) into feeding tube daily. 06/14/21  Yes Erin Fulling, MD  hydrOXYzine (ATARAX) 50 MG tablet Take 100 mg by mouth 2 (two) times daily. 12/02/22  Yes [provider]  lip balm (BLISTEX) OINT Apply 1 application topically 3 (three) times daily. 06/13/21  Yes Erin Fulling, MD  loperamide (IMODIUM) 2 MG capsule Take 1 capsule (2 mg total) by mouth as needed for diarrhea or loose stools. 06/13/21  Yes Erin Fulling, MD  midodrine (PROAMATINE) 5 MG tablet Place 3 tablets (15 mg total) into feeding tube 3 (three) times daily with meals. 06/13/21  Yes Erin Fulling, MD  multivitamin (RENA-VIT) TABS tablet Place 1 tablet into feeding tube at bedtime. 06/13/21  Yes Erin Fulling, MD  omeprazole (PRILOSEC) 20 MG capsule Take 20 mg by mouth daily. 11/12/22  Yes [provider]  sennosides (SENOKOT) 8.8 MG/5ML syrup Place 10 mLs into feeding tube at bedtime as needed for moderate constipation. 06/13/21  Yes Erin Fulling, MD  artificial tears (LACRILUBE) OINT ophthalmic ointment Place into both eyes every 4 (four) hours as needed for dry eyes. Patient not taking: Reported on 12/06/2022 06/13/21   Erin Fulling, MD  chlorhexidine gluconate, MEDLINE KIT, (PERIDEX) 0.12 % solution 15 mLs by Mouth Rinse route 2 (two) times daily. Patient not taking: Reported on 12/06/2022 06/13/21   Erin Fulling, MD  collagenase (SANTYL) ointment Apply topically daily. Patient not taking: Reported on 12/06/2022 06/14/21   Erin Fulling, MD  dextrose 5 % SOLN 100 mL with copper chloride 0.4 MG/ML SOLN 2 mg Inject 2 mg into the vein daily. 06/14/21   Erin Fulling, MD  ipratropium-albuterol (DUONEB) 0.5-2.5 (3) MG/3ML  SOLN Take 3 mLs by nebulization 3 (three) times daily. Patient not taking: Reported on 12/06/2022 06/13/21    Erin Fulling, MD  lactobacillus (FLORANEX/LACTINEX) PACK Place 1 packet (1 g total) into feeding tube 3 (three) times daily with meals. Patient not taking: Reported on 12/06/2022 06/13/21   Erin Fulling, MD  pantoprazole sodium (PROTONIX) 40 mg Place 40 mg into feeding tube daily. Patient not taking: Reported on 12/06/2022 06/13/21   Erin Fulling, MD  Scheduled Meds:  amiodarone  200 mg Per Tube BID   ascorbic acid  500 mg Per Tube BID   atorvastatin  40 mg Per Tube Daily   chlorhexidine  15 mL Mouth Rinse BID   Chlorhexidine Gluconate Cloth  6 each Topical Q0600   vitamin D3  2,000 Units Per Tube Daily   [START ON 12/18/2022] copper  2 mg Per Tube Daily   droxidopa  200 mg Per Tube TID WC   escitalopram  10 mg Per Tube Daily   famotidine  10 mg Per Tube Daily   feeding supplement (NEPRO CARB STEADY)  1,000 mL Per Tube Q24H   feeding supplement (PROSource TF20)  60 mL Per Tube Daily   free water  30 mL Per Tube Q4H   Gerhardt's butt cream   Topical BID   heparin injection (subcutaneous)  5,000 Units Subcutaneous Q8H   hydrOXYzine  50 mg Per Tube Q6H   insulin aspart  0-6 Units Subcutaneous Q4H   midodrine  15 mg Per Tube TID WC   multivitamin  1 tablet Per Tube QHS   mupirocin ointment   Nasal BID   nutrition supplement (JUVEN)  1 packet Per Tube BID BM   mouth rinse  15 mL Mouth Rinse 4 times per day   potassium & sodium phosphates  2 packet Per Tube BID   vancomycin variable dose per unstable renal function (pharmacist dosing)   Does not apply See admin instructions   Continuous Infusions:  sodium chloride Stopped (12/07/22 0210)   sodium chloride Stopped (12/08/22 1823)   copper chloride 2 mg in dextrose 5 % 100 mL IVPB 2 mg (12/11/22 0826)   norepinephrine (LEVOPHED) Adult infusion 2 mcg/min (12/11/22 0307)   PRN Meds:.sodium chloride, acetaminophen **OR** acetaminophen, artificial tears, clonazePAM, fentaNYL (SUBLIMAZE) injection, ipratropium-albuterol, loperamide HCl, magnesium  hydroxide, ondansetron **OR** ondansetron (ZOFRAN) IV, mouth rinse, sennosides, traZODone   Critical care provider statement:   Total critical care time: 33 minutes   Performed by: Karna Christmas MD   Critical care time was exclusive of separately billable procedures and treating other patients.   Critical care was necessary to treat or prevent imminent or life-threatening deterioration.   Critical care was time spent personally by me on the following activities: development of treatment plan with patient and/or surrogate as well as nursing, discussions with consultants, evaluation of patient's response to treatment, examination of patient, obtaining history from patient or surrogate, ordering and performing treatments and interventions, ordering and review of laboratory studies, ordering and review of radiographic studies, pulse oximetry and re-evaluation of patient's condition.    Vida Rigger, M.D.  Pulmonary & Critical Care Medicine

## 2022-12-11 NOTE — Progress Notes (Signed)
PHARMACY CONSULT NOTE  Pharmacy Consult for Electrolyte Monitoring and Replacement   Recent Labs: Potassium (mmol/L)  Date Value  12/11/2022 2.9 (L)  08/11/2013 3.3 (L)   Magnesium (mg/dL)  Date Value  96/07/5407 2.2   Calcium (mg/dL)  Date Value  81/19/1478 8.9   Calcium, Total (mg/dL)  Date Value  29/56/2130 9.2   Albumin (g/dL)  Date Value  86/57/8469 2.1 (L)  08/11/2013 3.1 (L)   Phosphorus (mg/dL)  Date Value  62/95/2841 1.7 (L)   Sodium (mmol/L)  Date Value  12/11/2022 139  08/11/2013 138    Assessment: 72 y.o. female with medical history significant for asthma, COPD, type diabetes mellitus, s/p G-tube, GERD, ESRD on HD, hypertension, osteoarthritis, paroxysmal atrial fibrillation and OSA on CPAP, who presented to the emergency room with acute onset of altered mental status with lethargy and decreased responsiveness now on CRRT.  CRRT stopped 8/25. Plan for line holiday. Patient is on tube feeds and having diarrhea. Hypophosphatemia suspected secondary to re-feeding.   Goal of Therapy:  Electrolytes WNL  Plan:  --K 2.9, Kcl 40 mEq per tube x 1 --Phos 1.7, give Phos-Nak 2 packets per tube x 2 doses. Trying to avoid IV replacement to avoid fluid overload with renal replacement therapy on hold --Re-check electrolytes again tomorrow AM  Tressie Ellis 12/11/2022 7:52 AM

## 2022-12-11 NOTE — Progress Notes (Signed)
Pharmacy Antibiotic Note  Alexandra Foster is a 72 y.o. female admitted on 12/05/2022 with  aspiration PNA .  Pharmacy has been consulted for Unasyn dosing.   Pt is on HD.   Plan: Unasyn 3 gm IV Q12H to start 8/29 @ 2300.   Height: 5\' 4"  (162.6 cm) Weight: 90 kg (198 lb 6.6 oz) IBW/kg (Calculated) : 54.7  Temp (24hrs), Avg:98.4 F (36.9 C), Min:97.9 F (36.6 C), Max:98.7 F (37.1 C)  Recent Labs  Lab 12/05/22 2056 12/06/22 0016 12/06/22 0354 12/06/22 0404 12/06/22 0734 12/06/22 0735 12/06/22 1257 12/06/22 1614 12/08/22 0525 12/08/22 0613 12/08/22 1130 12/09/22 0417 12/10/22 0551 12/11/22 0557  WBC 21.4*  --   --  16.0*   < >  --   --    < > 34.6* 34.5*  --  33.7* 38.8* 36.8*  CREATININE 4.53*  --    < > 4.23*  --   --   --    < > 2.38* 2.57*  --  2.88* 3.31* 3.57*  LATICACIDVEN 2.3* 2.9*  --  2.0*  --  1.9 1.7  --   --   --   --   --   --   --   VANCORANDOM  --   --   --   --   --   --   --   --   --   --  22  --   --  22   < > = values in this interval not displayed.    Estimated Creatinine Clearance: 15 mL/min (A) (by C-G formula based on SCr of 3.57 mg/dL (H)).    No Known Allergies  Antimicrobials this admission:   >>    >>   Dose adjustments this admission:   Microbiology results:  BCx:   UCx:    Sputum:    MRSA PCR:   Thank you for allowing pharmacy to be a part of this patient's care.  Jaylene Arrowood D 12/11/2022 10:53 PM

## 2022-12-11 NOTE — Progress Notes (Signed)
Date of Admission:  12/05/2022     ID: Alexandra Foster is a 72 y.o. female  Principal Problem:   Septic shock (HCC) Active Problems:   MRSA bacteremia   Acute metabolic encephalopathy   Paroxysmal atrial fibrillation (HCC)   Anxiety and depression   Type 2 diabetes mellitus with chronic kidney disease, with long-term current use of insulin (HCC)   ESRD on hemodialysis (HCC)    Subjective: Pt had dialysis cath placed Stated dialysis BP low - on pressor, midodrine  Medications:   amiodarone  200 mg Per Tube BID   ascorbic acid  500 mg Per Tube BID   atorvastatin  40 mg Per Tube Daily   chlorhexidine  15 mL Mouth Rinse BID   Chlorhexidine Gluconate Cloth  6 each Topical Q0600   [START ON 12/12/2022] Chlorhexidine Gluconate Cloth  6 each Topical Q0600   vitamin D3  2,000 Units Per Tube Daily   [START ON 12/18/2022] copper  2 mg Per Tube Daily   droxidopa  200 mg Per Tube TID WC   escitalopram  10 mg Per Tube Daily   famotidine  10 mg Per Tube Daily   feeding supplement (NEPRO CARB STEADY)  1,000 mL Per Tube Q24H   feeding supplement (PROSource TF20)  60 mL Per Tube Daily   free water  30 mL Per Tube Q4H   Gerhardt's butt cream   Topical BID   heparin injection (subcutaneous)  5,000 Units Subcutaneous Q8H   hydrOXYzine  50 mg Per Tube Q6H   insulin aspart  0-6 Units Subcutaneous Q4H   midodrine  15 mg Per Tube TID WC   multivitamin  1 tablet Per Tube QHS   mupirocin ointment   Nasal BID   nutrition supplement (JUVEN)  1 packet Per Tube BID BM   mouth rinse  15 mL Mouth Rinse 4 times per day   potassium & sodium phosphates  2 packet Per Tube BID   vancomycin variable dose per unstable renal function (pharmacist dosing)   Does not apply See admin instructions    Objective: Vital signs in last 24 hours: Patient Vitals for the past 24 hrs:  BP Temp Temp src Pulse Resp SpO2 Weight  12/11/22 1519 (!) 108/49 -- -- 71 19 99 % --  12/11/22 1430 (!) 89/49 -- -- 68 18 97 % --   12/11/22 1415 (!) 90/52 -- -- 70 (!) 21 98 % --  12/11/22 1405 (!) 99/50 -- -- 71 (!) 21 98 % --  12/11/22 1345 -- -- -- 71 (!) 22 97 % --  12/11/22 1315 (!) 101/49 -- -- 73 20 96 % --  12/11/22 1300 (!) 92/50 -- -- 75 (!) 23 98 % --  12/11/22 1245 92/72 -- -- 71 18 98 % --  12/11/22 1230 (!) 64/42 -- -- 77 18 99 % --  12/11/22 1215 (!) 94/54 -- -- 74 (!) 25 99 % --  12/11/22 1200 (!) 96/50 -- -- 72 (!) 22 96 % --  12/11/22 1145 (!) 99/57 -- -- 70 19 97 % --  12/11/22 1130 (!) 89/46 -- -- 72 (!) 22 98 % --  12/11/22 1115 (!) 84/54 -- -- 74 -- 92 % --  12/11/22 1045 (!) 76/45 -- -- 67 18 93 % --  12/11/22 1000 (!) 123/108 -- -- 76 14 96 % --  12/11/22 0948 (!) 116/54 -- -- 67 15 99 % --  12/11/22 0845 (!) 98/55 -- -- 70 [name])  22 97 % --  12/11/22 0800 (!) 82/41 -- -- 64 18 98 % --  12/11/22 0745 (!) 87/42 -- -- 64 18 99 % --  12/11/22 0730 (!) 82/51 -- -- 66 19 98 % --  12/11/22 0715 (!) 77/44 -- -- 66 20 98 % --  12/11/22 0700 (!) 90/40 -- -- 67 20 98 % --  12/11/22 0600 (!) 89/52 -- -- 71 20 99 % --  12/11/22 0500 (!) 87/47 -- -- 66 19 98 % --  12/11/22 0420 -- -- -- -- -- -- 90 kg  12/11/22 0400 (!) 83/51 98.3 F (36.8 C) -- 65 20 98 % --  12/11/22 0300 (!) 83/43 -- -- 65 19 98 % --  12/11/22 0200 (!) 92/49 -- -- 64 19 97 % --  12/11/22 0100 (!) 97/49 -- -- 67 (!) 26 99 % --  12/11/22 0000 (!) 86/53 98.6 F (37 C) -- 66 (!) 22 98 % --  12/10/22 2350 -- -- -- 67 19 98 % --  12/10/22 2300 (!) 91/43 -- -- 66 18 98 % --  12/10/22 2200 (!) 88/40 -- -- 63 18 98 % --  12/10/22 2100 (!) 102/49 -- -- 66 19 99 % --  12/10/22 2015 (!) 85/52 98.3 F (36.8 C) -- 63 18 99 % --  12/10/22 2000 (!) 62/44 -- -- 64 15 97 % --  12/10/22 1915 (!) 86/41 -- -- 63 17 98 % --  12/10/22 1900 (!) 74/50 -- -- 63 14 99 % --  12/10/22 1845 (!) 85/37 -- -- 64 18 98 % --  12/10/22 1830 (!) 74/44 -- -- 64 18 97 % --  12/10/22 1815 (!) 76/46 -- -- 63 18 97 % --  12/10/22 1800 (!) 87/43 98.2 F (36.8 C)  Axillary 63 18 98 % --  12/10/22 1745 (!) 88/69 -- -- 64 18 98 % --  12/10/22 1730 (!) 85/40 -- -- 64 19 99 % --  12/10/22 1715 (!) 81/52 -- -- 65 17 99 % --  12/10/22 1700 (!) 94/47 -- -- 65 18 99 % --       PHYSICAL EXAM:  General:awake, no distress . Lungs: b/ll air entry Heart: Regular rate and rhythm, no murmur, rub or gallop. Abdomen: Soft, PEG in place Extremities: atraumatic, no cyanosis. No edema. No clubbing Skin: edema of the skin- stretched and shiny Rectal foley   Lab Results    Latest Ref Rng & Units 12/11/2022    5:57 AM 12/10/2022    5:51 AM 12/09/2022    4:17 AM  CBC  WBC 4.0 - 10.5 K/uL 36.8  38.8  33.7   Hemoglobin 12.0 - 15.0 g/dL 8.0  7.4  7.4   Hematocrit 36.0 - 46.0 % 25.3  23.1  22.7   Platelets 150 - 400 K/uL 301  244  213        Latest Ref Rng & Units 12/11/2022    5:57 AM 12/10/2022    5:51 AM 12/09/2022    6:35 PM  CMP  Glucose 70 - 99 mg/dL 782  956    BUN 8 - 23 mg/dL 99  89    Creatinine 2.13 - 1.00 mg/dL 0.86  5.78    Sodium 469 - 145 mmol/L 139  135    Potassium 3.5 - 5.1 mmol/L 2.9  3.5  4.2   Chloride 98 - 111 mmol/L 105  103    CO2 22 - 32 mmol/L 23  20    Calcium 8.9 - 10.3 mg/dL 8.9  8.6        Microbiology: 8/23 [facility] - MRSA 8/24 Cath tip MRSA 8/25 BC- NG so far Urine culture enterococcus and yeast Studies/Results: CT CHEST ABDOMEN PELVIS WO CONTRAST  Result Date: 12/10/2022 CLINICAL DATA:  Sepsis, bacteremia EXAM: CT CHEST, ABDOMEN AND PELVIS WITHOUT CONTRAST TECHNIQUE: Multidetector CT imaging of the chest, abdomen and pelvis was performed following the standard protocol without IV contrast. RADIATION DOSE REDUCTION: This exam was performed according to the departmental dose-optimization program which includes automated exposure control, adjustment of the mA and/or kV according to patient size and/or use of iterative reconstruction technique. COMPARISON:  CT abdomen pelvis, 06/11/2021 FINDINGS: CT CHEST FINDINGS Cardiovascular:  Aortic atherosclerosis. Normal heart size. No pericardial effusion. Mediastinum/Nodes: No enlarged mediastinal, hilar, or axillary lymph nodes. Thyroid gland, trachea, and esophagus demonstrate no significant findings. Lungs/Pleura: Small bilateral pleural effusions and associated atelectasis or consolidation. Musculoskeletal: No chest wall abnormality. No acute osseous findings. CT ABDOMEN PELVIS FINDINGS Hepatobiliary: Coarse, nodular cirrhotic morphology of the liver. Rim calcified gallstones. No gallbladder wall thickening or biliary ductal dilatation. Pancreas: Unremarkable. No pancreatic ductal dilatation or surrounding inflammatory changes. Spleen: Normal in size without significant abnormality. Adrenals/Urinary Tract: Adrenal glands are unremarkable. Multiple nonobstructive right renal calculi. No ureteral calculi or hydronephrosis. Bladder is unremarkable. Stomach/Bowel: Percutaneous gastrostomy, tip and balloon within the distal gastric body. Appendix appears normal. No evidence of bowel wall thickening, distention, or inflammatory changes. Sigmoid diverticulosis. Rectal tube. Vascular/Lymphatic: Aortic atherosclerosis. Infrarenal IVC filter. No enlarged abdominal or pelvic lymph nodes. Reproductive: Status post hysterectomy. Other: No abdominal wall hernia or abnormality. No ascites. Musculoskeletal: No acute osseous findings. IMPRESSION: 1. Small bilateral pleural effusions and associated atelectasis or consolidation. 2. No acute noncontrast CT findings of the abdomen or pelvis to explain sepsis or bacteremia. 3. Cirrhosis. 4. Cholelithiasis. 5. Nonobstructive right renal calculi. 6. Sigmoid diverticulosis without evidence of acute diverticulitis. Aortic Atherosclerosis (ICD10-I70.0). Electronically Signed   By: Jearld Lesch M.D.   On: 12/10/2022 15:35   DG Abd 1 View  Result Date: 12/09/2022 CLINICAL DATA:  Abdominal distension, hypotension EXAM: ABDOMEN - 1 VIEW COMPARISON:  10/13/2022 FINDINGS:  Three supine frontal views of the abdomen and pelvis are obtained. Pubic symphysis is excluded by collimation. Percutaneous gastrostomy tube overlies the central upper abdomen. The bowel gas pattern is unremarkable without obstruction or ileus. Stable 7 mm right renal calculus. Numerous canal vein phleboliths are again noted. No abdominal mass. IVC filter again noted. Degenerative changes of the lower lumbar spine and bilateral hips. Lung bases are clear. IMPRESSION: 1. Unremarkable bowel gas pattern. 2. Stable 7 mm right renal calculus. Electronically Signed   By: Sharlet Salina M.D.   On: 12/09/2022 22:18     Assessment/Plan: MRSA bacteremia ( 8/23 and 8/24 culture positive) with septic shock Related to dialysis catheter Which has been removed Cath tip culture MRSA Repeat blood culture sent on 8/25 2 d echo no veg will need TEE. If tee cannot be done need to empirically treat for 6 weeks on vancomycin   ESRD on dialysis- previous cath was infected and was  removed New dialysis line placed in rt femoral vein- started dialysis  ? 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

## 2022-12-11 NOTE — Progress Notes (Signed)
Pt's daughter updated at bedside on plan of care.  She gives consent for placement of temporary HD catheter.  All questions answered.    Harlon Ditty, AGACNP-BC  Pulmonary & Critical Care Prefer epic messenger for cross cover needs If after hours, please call E-link

## 2022-12-11 NOTE — Progress Notes (Signed)
Received patient at bedside in ICU.  Alert and oriented.  Informed consent signed and in chart.   TX duration: 2 Hours 11 minutes  Patient tolerated well.  Patient remains in ICU, condition stable Alert, without acute distress.  Hand-off given to patient's nurse.   Access used: Right Femoral HD catheter Access issues: Initial flow issues; resolved with intervention  Total UF removed: 400 mL Medication(s) given: None Post HD VS: please see Data Insert Post HD weight: Unable to obtain   [facility]

## 2022-12-11 NOTE — Progress Notes (Signed)
Pharmacy Antibiotic Note  Alexandra Foster is a 72 y.o. female admitted on 12/05/2022 with bacteremia.  Bcx 4 of 4 GPC, BCID showing MRSA and Staph epidermidis. Patient started on CRRT 8/24 - stopped 8/25. Pharmacy has been consulted for vancomycin dosing and monitoring.   Today, 12/11/2022 Day #5 vancomycin Renal: on HD prior to admission, briefly on CRRT 8/24-8/25.  Her previous HD jugular catheter was removed  8/24 d/t concern for infection.  She is currently on line holiday (femoral line placed for CRRT removed 8/27).   Still makes some Urine Afebrile WBC 36.8 8/23 Bcx MRSA, repeat Bcx 8/25 NGTD 8/24 cath tip: MRSA  Vancomycin levels:  12/08/22 1130 vancomycin random = 22, vancomycin 750mg  IV x 1 8/27 at 17:43 8/29 05:57 random vancomycin level = 22  Plan: Nephrology note yesterday mentions possibility of resuming HD today.  If resumes today, will need access.  If gets  line and has HD today then will plan to give vancomyciin 1gm IV today and follow HD schedule.  If no HD today, then can give vanocmycin dose 8/30 am.    Height: 5\' 4"  (162.6 cm) Weight: 90 kg (198 lb 6.6 oz) IBW/kg (Calculated) : 54.7  Temp (24hrs), Avg:98.2 F (36.8 C), Min:97.7 F (36.5 C), Max:98.6 F (37 C)  Recent Labs  Lab 12/05/22 2056 12/06/22 0016 12/06/22 0354 12/06/22 0404 12/06/22 0734 12/06/22 0735 12/06/22 1257 12/06/22 1614 12/08/22 0525 12/08/22 0613 12/08/22 1130 12/09/22 0417 12/10/22 0551 12/11/22 0557  WBC 21.4*  --   --  16.0*   < >  --   --    < > 34.6* 34.5*  --  33.7* 38.8* 36.8*  CREATININE 4.53*  --    < > 4.23*  --   --   --    < > 2.38* 2.57*  --  2.88* 3.31* 3.57*  LATICACIDVEN 2.3* 2.9*  --  2.0*  --  1.9 1.7  --   --   --   --   --   --   --   VANCORANDOM  --   --   --   --   --   --   --   --   --   --  22  --   --  22   < > = values in this interval not displayed.    Estimated Creatinine Clearance: 15 mL/min (A) (by C-G formula based on SCr of 3.57 mg/dL (H)).     No Known Allergies  Antimicrobials this admission: 8/23 Azithromycin / CRO x 1 8/24 Cefepime >> 8/26 8/24 Vancomycin >>   Microbiology results: 8/23 BCx: 4/4 MRSA 8/23 Cath tip: MRSA 8/24 MRSA PCR: Positive   Thank you for allowing pharmacy to be a part of this patient's care.  Juliette Alcide, PharmD, BCPS, BCIDP Work Cell: [phone] 12/11/2022 10:14 AM

## 2022-12-11 NOTE — Progress Notes (Signed)
Daily Progress Note   Patient Name: Alexandra Foster       Date: 12/11/2022 DOB: 03/01/1951  Age: 72 y.o. MRN#: [id number] Attending Physician: Vida Rigger, MD Primary Care Physician: Patrecia Pour, MD Admit Date: 12/05/2022  Reason for Consultation/Follow-up: Establishing goals of care  Subjective: Notes and labs reviewed.  Into see patient.  She is resting with her eyes closed.  Spoke with daughter in vacant waiting room.  She states that patient is married and she is only child.  She discusses that her mother's intubation was January 2023.  She states that her mother was decannulated this January.  She states speech signed off on all her therapy.  She states patient has an intermittent cough that began after decannulation.  Daughter states that her mother has been happy with her quality of life.  She states her mother has always been cheerful and "the life of the party".  Daughter very clearly advises that patient has been clear that she wants to do anything and everything possible to live longer.  She advises that patient has a great grandchild that should be born in around a month, and patient is very happy about this.  Daughter states she would like to explore options for taking patient home at discharge.  She states that her plan prior to this admission was to take patient home from facility in September.  Length of Stay: 5  Current Medications: Scheduled Meds:   amiodarone  200 mg Per Tube BID   ascorbic acid  500 mg Per Tube BID   atorvastatin  40 mg Per Tube Daily   chlorhexidine  15 mL Mouth Rinse BID   Chlorhexidine Gluconate Cloth  6 each Topical Q0600   [START ON 12/12/2022] Chlorhexidine Gluconate Cloth  6 each Topical Q0600   vitamin D3  2,000 Units Per  Tube Daily   [START ON 12/18/2022] copper  2 mg Per Tube Daily   droxidopa  200 mg Per Tube TID WC   escitalopram  10 mg Per Tube Daily   famotidine  10 mg Per Tube Daily   feeding supplement (NEPRO CARB STEADY)  1,000 mL Per Tube Q24H   feeding supplement (PROSource TF20)  60 mL Per Tube Daily   free water  30 mL Per Tube Q4H   Gerhardt's butt cream  Topical BID   heparin injection (subcutaneous)  5,000 Units Subcutaneous Q8H   hydrOXYzine  50 mg Per Tube Q6H   insulin aspart  0-6 Units Subcutaneous Q4H   midodrine  15 mg Per Tube TID WC   multivitamin  1 tablet Per Tube QHS   mupirocin ointment   Nasal BID   nutrition supplement (JUVEN)  1 packet Per Tube BID BM   mouth rinse  15 mL Mouth Rinse 4 times per day   potassium & sodium phosphates  2 packet Per Tube BID   vancomycin variable dose per unstable renal function (pharmacist dosing)   Does not apply See admin instructions    Continuous Infusions:  sodium chloride Stopped (12/07/22 0210)   sodium chloride Stopped (12/08/22 1823)   anticoagulant sodium citrate     copper chloride 2 mg in dextrose 5 % 100 mL IVPB 2 mg (12/11/22 0826)   norepinephrine (LEVOPHED) Adult infusion 2 mcg/min (12/11/22 0307)   vancomycin      PRN Meds: sodium chloride, acetaminophen **OR** acetaminophen, alteplase, alteplase, anticoagulant sodium citrate, artificial tears, clonazePAM, feeding supplement (NEPRO CARB STEADY), fentaNYL (SUBLIMAZE) injection, heparin, heparin, ipratropium-albuterol, lidocaine (PF), lidocaine-prilocaine, loperamide HCl, magnesium hydroxide, ondansetron **OR** ondansetron (ZOFRAN) IV, mouth rinse, pentafluoroprop-tetrafluoroeth, sennosides, traZODone  Physical Exam Constitutional:      Comments: Eyes closed  Pulmonary:     Effort: Pulmonary effort is normal.             Vital Signs: BP (!) 108/49   Pulse 71   Temp 98.3 F (36.8 C)   Resp 19   Ht 5\' 4"  (1.626 m)   Wt 90 kg   SpO2 99%   BMI 34.06 kg/m  SpO2:  SpO2: 99 % O2 Device: O2 Device: Room Air O2 Flow Rate:    Intake/output summary:  Intake/Output Summary (Last 24 hours) at 12/11/2022 1628 Last data filed at 12/11/2022 1105 Gross per 24 hour  Intake 1244.97 ml  Output 1150 ml  Net 94.97 ml   LBM: Last BM Date : 12/11/22 Baseline Weight: Weight: 84.7 kg Most recent weight: Weight: 90 kg         Patient Active Problem List   Diagnosis Date Noted   MRSA bacteremia 12/06/2022   Acute metabolic encephalopathy 12/06/2022   Paroxysmal atrial fibrillation (HCC) 12/06/2022   Anxiety and depression 12/06/2022   Type 2 diabetes mellitus with chronic kidney disease, with long-term current use of insulin (HCC) 12/06/2022   ESRD on hemodialysis (HCC) 12/06/2022   Septic shock (HCC) 12/06/2022   Abdominal distension    Left leg cellulitis    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   Subdural hemorrhage (HCC) 05/18/2021   Necrotizing fasciitis (HCC) 05/12/2021   Acute and chronic respiratory failure (acute-on-chronic) (HCC) 05/12/2021   On mechanically assisted ventilation (HCC) 05/12/2021   Shock liver 05/12/2021   Anemia associated with acute blood loss 05/12/2021   Metabolic acidosis 05/12/2021   Encounter for continuous renal replacement therapy (CRRT) for acute renal failure (HCC) 05/12/2021   Acute encephalopathy 05/12/2021   Group A streptococcal infection 05/12/2021   Streptococcal toxic shock syndrome (HCC) 05/12/2021   Atrial fibrillation and flutter (HCC)    Severe sepsis with septic shock (CODE) (HCC) 05/04/2021   Personal history of COVID-19 07/14/2019   Bilateral lower extremity edema 05/03/2018   Lower extremity pain, bilateral 05/03/2018   Diabetes mellitus type 2 in obese 04/23/2018   Osteoarthritis of both knees 01/14/2018   Urge incontinence 03/14/2016   Osteopenia 02/13/2016  OSA (obstructive sleep apnea) 01/08/2016   Allergic rhinitis 04/13/2015   COPD (chronic obstructive pulmonary disease) (HCC)  09/18/2014   Morbid obesity (HCC) 09/18/2014   Esophagitis, reflux 09/18/2014   Essential hypertension 09/21/2006    Palliative Care Assessment & Plan    Recommendations/Plan: Full code/full scope   Code Status:    Code Status Orders  (From admission, onward)           Start     Ordered   12/06/22 0124  Full code  Continuous       Question:  By:  Answer:  Consent: discussion documented in EHR   12/06/22 0132           Code Status History     Date Active Date Inactive Code Status Order ID Comments User Context   05/04/2021 2329 06/13/2021 2307 Full Code [id number]  Jimmye Norman, NP ED   04/30/2019 1934 05/03/2019 1822 Full Code [id number]  Clydia Llano, MD Inpatient   04/29/2019 2048 04/30/2019 1830 Full Code [id number]  Mansy, Vernetta Honey, MD ED     Care plan was discussed with nurse  Thank you for allowing the Palliative Medicine Team to assist in the care of this patient.   Morton Stall, NP  Please contact Palliative Medicine Team phone at [phone] for questions and concerns.

## 2022-12-11 NOTE — Progress Notes (Signed)
Received patient at bedside in ICU.  Alert and oriented.  Informed consent signed and in chart.   TX duration: 2 Hours 11 minutes  Patient tolerated well.  Patient remains in ICU, condition stable Alert, without acute distress.  Hand-off given to patient's nurse.   Access used: Right Femoral HD catheter Access issues: Initial flow issues; resolved with intervention  Total UF removed: 400 mL Medication(s) given: None Post HD VS: please see Data Insert Post HD weight: Unable to obtain    12/11/22 2300  Vitals  Temp 98.4 F (36.9 C)  Temp Source Axillary  BP (!) 89/77  MAP (mmHg) 83  BP Location Left Arm  BP Method Automatic  Patient Position (if appropriate) Lying  Pulse Rate Source Monitor  ECG Heart Rate 88  Resp (!) 24  Oxygen Therapy  O2 Device Nasal Cannula  O2 Flow Rate (L/min) 4 L/min  Patient Activity (if Appropriate) In bed  Pulse Oximetry Type Continuous  Hepatitis B Pre Treatment Patient Checks  Hepatitis B Surface Antigen Results Nonreactive  Date Hepatitis B Surface Antigen Drawn 12/11/22  Hep B Antibody Quant/Post Pending  Date Hep B Antibody Quant/Post Drawn 12/11/22  Patient's Immunity Status Pending (labs drawn, awaiting results)  Isolation Initiated No  During Treatment Monitoring  Blood Flow Rate (mL/min) 0 mL/min  Arterial Pressure (mmHg) -34.34 mmHg  Venous Pressure (mmHg) 55.96 mmHg  TMP (mmHg) -0.4 mmHg  Ultrafiltration Rate (mL/min) 479 mL/min  Dialysate Flow Rate (mL/min) 300 ml/min  Duration of HD Treatment -hour(s) 2.19 hour(s)  Cumulative Fluid Removed (mL) per Treatment  351.74  Post Treatment  Dialyzer Clearance Lightly streaked  Hemodialysis Intake (mL) 0 mL  Liters Processed 52.3  Fluid Removed (mL) 400 mL  Tolerated HD Treatment Yes  Note  Patient Observations Patient responsive, reports some relief after emesis episode, no acute distress noted, condition stable at time of this d/c.Treatment terminated with 19 minutes  remaining d/t unstable flow pressure alarms triggering machine flow balance failure and machine advised treatment termination.  Hemodialysis Catheter Right Femoral vein Triple lumen Temporary (Non-Tunneled)  Placement Date/Time: 12/11/22 1210   Time Out: Correct patient;Correct site;Correct procedure  Person Inserting LDA: Leanord Asal, NP  Orientation: Right  Access Location: Femoral vein  Hemodialysis Catheter Type: Triple lumen Temporary (Non-Tunneled)  Site Condition No complications  Blue Lumen Status Flushed;Heparin locked;Dead end cap in place  Red Lumen Status Flushed;Heparin locked;Dead end cap in place  Catheter fill solution Heparin 1000 units/ml  Catheter fill volume (Arterial) 1.8 cc  Catheter fill volume (Venous) 1.8  Dressing Type Transparent  Dressing Status Antimicrobial disc in place;Clean, Dry, Intact  Drainage Description None  Post treatment catheter status Capped and Clamped      [facility]

## 2022-12-11 NOTE — Progress Notes (Signed)
[facility]  ROUNDING NOTE   Subjective:   Daughter at bedside   Weaned down on norepinephrine gtt.   Objective:  Vital signs in last 24 hours:  Temp:  [97.7 F (36.5 C)-98.6 F (37 C)] 98.3 F (36.8 C) (08/29 0400) Pulse Rate:  [59-76] 67 (08/29 1045) Resp:  [14-26] 18 (08/29 1045) BP: (62-123)/(34-108) 76/45 (08/29 1045) SpO2:  [93 %-100 %] 93 % (08/29 1045) Weight:  [90 kg] 90 kg (08/29 0420)  Weight change: 0.5 kg Filed Weights   12/09/22 0430 12/10/22 0500 12/11/22 0420  Weight: 90.5 kg 89.5 kg 90 kg    Intake/Output: I/O last 3 completed shifts: In: 2834.6 [I.V.:318.1; Other:260; ZH/YQ:6578; IV Piggyback:111.4] Out: 1250 [Urine:750; Stool:500]   Intake/Output this shift:  No intake/output data recorded.  Physical Exam: General: Critically ill  Head: Normocephalic, atraumatic. Moist oral mucosal membranes  Eyes: Anicteric  Lungs:  Diminished bilaterally   Heart: regular   Abdomen:  Soft, nontender  Extremities:  1+ peripheral edema.  Neurologic: Alert to self and place, not to situation or time   Skin: No lesions  Access:  none    Basic Metabolic Panel: Recent Labs  Lab 12/07/22 1843 12/08/22 0525 12/08/22 0613 12/09/22 0417 12/09/22 1256 12/09/22 1835 12/10/22 0551 12/11/22 0557  NA  --  130* 132* 132*  --   --  135 139  K  --  3.6 3.7 2.9* 3.0* 4.2 3.5 2.9*  CL  --  100 102 102  --   --  103 105  CO2  --  21* 20* 20*  --   --  20* 23  GLUCOSE  --  178* 184* 191*  --   --  179* 132*  BUN  --  41* 38* 57*  --   --  89* 99*  CREATININE  --  2.38* 2.57* 2.88*  --   --  3.31* 3.57*  CALCIUM  --  8.2* 8.2* 8.4*  --   --  8.6* 8.9  MG 2.2 2.3 2.2 2.4  --   --   --  2.2  PHOS 1.8* 1.7*  1.6* 1.7* 1.7* 1.1* 1.1* 1.1* 1.7*    Liver Function Tests: Recent Labs  Lab 12/05/22 2056 12/06/22 0354 12/07/22 1608 12/08/22 0525 12/09/22 0417 12/10/22 0551 12/11/22 0557  AST 66*  --   --   --   --   --   --   ALT 87*  --   --   --    --   --   --   ALKPHOS 157*  --   --   --   --   --   --   BILITOT 0.6  --   --   --   --   --   --   PROT 8.6*  --   --   --   --   --   --   ALBUMIN 3.3*   < > 1.8* 2.0* 2.1* 2.2* 2.1*   < > = values in this interval not displayed.   No results for input(s): "LIPASE", "AMYLASE" in the last 168 hours. No results for input(s): "AMMONIA" in the last 168 hours.  CBC: Recent Labs  Lab 12/05/22 2056 12/06/22 0404 12/08/22 0525 12/08/22 4696 12/08/22 1656 12/08/22 2217 12/09/22 0417 12/10/22 0551 12/11/22 0557  WBC 21.4*   < > 34.6* 34.5*  --   --  33.7* 38.8* 36.8*  NEUTROABS 19.4*  --   --   --   --   --   --   --   --  HGB 10.2*   < > 6.7* 6.7* 7.7* 7.6* 7.4* 7.4* 8.0*  HCT 33.4*   < > 21.4* 21.7* 23.6* 23.2* 22.7* 23.1* 25.3*  MCV 84.3   < > 82.6 83.8  --   --  83.5 85.6 86.9  PLT 411*   < > 242 256  --   --  213 244 301   < > = values in this interval not displayed.    Cardiac Enzymes: No results for input(s): "CKTOTAL", "CKMB", "CKMBINDEX", "TROPONINI" in the last 168 hours.  BNP: Invalid input(s): "POCBNP"  CBG: Recent Labs  Lab 12/10/22 1612 12/10/22 2007 12/10/22 2352 12/11/22 0401 12/11/22 0743  GLUCAP 116* 107* 120* 134* 126*    Microbiology: Results for orders placed or performed during the hospital encounter of 12/05/22  Culture, blood (Routine x 2)     Status: Abnormal   Collection Time: 12/05/22  8:56 PM   Specimen: BLOOD  Result Value Ref Range Status   Specimen Description   Final    BLOOD BLOOD RIGHT ARM Performed at [facility], [address], [place], [place] [address]    Special Requests   Final    BOTTLES DRAWN AEROBIC AND ANAEROBIC Blood Culture adequate volume Performed at [facility], [address] [place], [place] [address]    Culture  Setup Time   Final    GRAM POSITIVE COCCI IN BOTH AEROBIC AND ANAEROBIC BOTTLES CRITICAL RESULT CALLED TO, READ BACK BY AND VERIFIED WITH: SHEEMA HALLLAJI AT 1206  12/06/22.PMF Performed at [facility], [address] [place], [place] [address]    Culture (A)  Final    STAPHYLOCOCCUS AUREUS SUSCEPTIBILITIES PERFORMED ON PREVIOUS CULTURE WITHIN THE LAST 5 DAYS. Performed at [facility], [address] [place], [place] [address]    Report Status 12/08/2022 FINAL  Final  Culture, blood (Routine x 2)     Status: Abnormal   Collection Time: 12/05/22  8:57 PM   Specimen: BLOOD  Result Value Ref Range Status   Specimen Description   Final    BLOOD BLOOD LEFT ARM Performed at [facility], [address] [place], [place] [address]    Special Requests   Final    BOTTLES DRAWN AEROBIC AND ANAEROBIC Blood Culture adequate volume Performed at [facility], [address] [place], [place] [address]    Culture  Setup Time   Final    GRAM POSITIVE COCCI IN BOTH AEROBIC AND ANAEROBIC BOTTLES CRITICAL RESULT CALLED TO, READ BACK BY AND VERIFIED WITH: SHEEMA HALLAJI AT 1206 12/06/22.PMF Performed at [facility], [address], [place], [place] [address]    Culture METHICILLIN RESISTANT STAPHYLOCOCCUS AUREUS (A)  Final   Report Status 12/08/2022 FINAL  Final   Organism ID, Bacteria METHICILLIN RESISTANT STAPHYLOCOCCUS AUREUS  Final      Susceptibility   Methicillin resistant staphylococcus aureus - MIC*    CIPROFLOXACIN >=8 RESISTANT Resistant     ERYTHROMYCIN >=8 RESISTANT Resistant     GENTAMICIN <=0.5 SENSITIVE Sensitive     OXACILLIN >=4 RESISTANT Resistant     TETRACYCLINE <=1 SENSITIVE Sensitive     VANCOMYCIN <=0.5 SENSITIVE Sensitive     TRIMETH/SULFA >=320 RESISTANT Resistant     CLINDAMYCIN <=0.25 SENSITIVE Sensitive     RIFAMPIN <=0.5 SENSITIVE Sensitive     Inducible Clindamycin NEGATIVE Sensitive     LINEZOLID 2 SENSITIVE Sensitive     * METHICILLIN RESISTANT STAPHYLOCOCCUS AUREUS  Blood Culture ID  Panel (Reflexed)     Status: Abnormal   Collection Time: 12/05/22  8:57 PM  Result Value Ref  Range Status   Enterococcus faecalis NOT DETECTED NOT DETECTED Final   Enterococcus Faecium NOT DETECTED NOT DETECTED Final   Listeria monocytogenes NOT DETECTED NOT DETECTED Final   Staphylococcus species DETECTED (A) NOT DETECTED Final    Comment: CRITICAL RESULT CALLED TO, READ BACK BY AND VERIFIED WITH: SHEEMA HALLAJI AT 1206 12/06/22.PMF    Staphylococcus aureus (BCID) DETECTED (A) NOT DETECTED Final    Comment: Methicillin (oxacillin)-resistant Staphylococcus aureus (MRSA). MRSA is predictably resistant to beta-lactam antibiotics (except ceftaroline). Preferred therapy is vancomycin unless clinically contraindicated. Patient requires contact precautions if  hospitalized. CRITICAL RESULT CALLED TO, READ BACK BY AND VERIFIED WITH: SHEEMA HALLAJI AT 1206 12/06/22.PMF    Staphylococcus epidermidis DETECTED (A) NOT DETECTED Final    Comment: CRITICAL RESULT CALLED TO, READ BACK BY AND VERIFIED WITH: SHEEMA HALLAJI AT 1206 12/06/22.PMF    Staphylococcus lugdunensis NOT DETECTED NOT DETECTED Final   Streptococcus species NOT DETECTED NOT DETECTED Final   Streptococcus agalactiae NOT DETECTED NOT DETECTED Final   Streptococcus pneumoniae NOT DETECTED NOT DETECTED Final   Streptococcus pyogenes NOT DETECTED NOT DETECTED Final   A.calcoaceticus-baumannii NOT DETECTED NOT DETECTED Final   Bacteroides fragilis NOT DETECTED NOT DETECTED Final   Enterobacterales NOT DETECTED NOT DETECTED Final   Enterobacter cloacae complex NOT DETECTED NOT DETECTED Final   Escherichia coli NOT DETECTED NOT DETECTED Final   Klebsiella aerogenes NOT DETECTED NOT DETECTED Final   Klebsiella oxytoca NOT DETECTED NOT DETECTED Final   Klebsiella pneumoniae NOT DETECTED NOT DETECTED Final   Proteus species NOT DETECTED NOT DETECTED Final   Salmonella species NOT DETECTED NOT DETECTED Final   Serratia marcescens NOT DETECTED NOT DETECTED Final   Haemophilus influenzae NOT DETECTED NOT DETECTED Final   Neisseria  meningitidis NOT DETECTED NOT DETECTED Final   Pseudomonas aeruginosa NOT DETECTED NOT DETECTED Final   Stenotrophomonas maltophilia NOT DETECTED NOT DETECTED Final   Candida albicans NOT DETECTED NOT DETECTED Final   Candida auris NOT DETECTED NOT DETECTED Final   Candida glabrata NOT DETECTED NOT DETECTED Final   Candida krusei NOT DETECTED NOT DETECTED Final   Candida parapsilosis NOT DETECTED NOT DETECTED Final   Candida tropicalis NOT DETECTED NOT DETECTED Final   Cryptococcus neoformans/gattii NOT DETECTED NOT DETECTED Final   Methicillin resistance mecA/C DETECTED (A) NOT DETECTED Final    Comment: CRITICAL RESULT CALLED TO, READ BACK BY AND VERIFIED WITH: SHEEMA HALLAJI AT 1206 12/06/22.PMF    Meth resistant mecA/C and MREJ DETECTED (A) NOT DETECTED Final    Comment: CRITICAL RESULT CALLED TO, READ BACK BY AND VERIFIED WITH: SHEEMA HALLAJI AT 1206 12/06/22.PMF Performed at [facility], [address] [place], [place] [address]   Resp panel by RT-PCR (RSV, Flu A&B, Covid) Anterior Nasal Swab     Status: None   Collection Time: 12/05/22  9:53 PM   Specimen: Anterior Nasal Swab  Result Value Ref Range Status   SARS Coronavirus 2 by RT PCR NEGATIVE NEGATIVE Final    Comment: (NOTE) SARS-CoV-2 target nucleic acids are NOT DETECTED.  The SARS-CoV-2 RNA is generally detectable in upper respiratory specimens during the acute phase of infection. The lowest concentration of SARS-CoV-2 viral copies this assay can detect is 138 copies/mL. A negative result does not preclude SARS-Cov-2 infection and should not be used as the sole basis for treatment or other patient management decisions. A  negative result may occur with  improper specimen collection/handling, submission of specimen other than nasopharyngeal swab, presence of viral mutation(s) within the areas targeted by this assay, and inadequate number of viral copies(<138 copies/mL). A negative result must be combined  with clinical observations, patient history, and epidemiological information. The expected result is Negative.  Fact Sheet for Patients:  BloggerCourse.com  Fact Sheet for Healthcare Providers:  SeriousBroker.it  This test is no t yet approved or cleared by the [place] FDA and  has been authorized for detection and/or diagnosis of SARS-CoV-2 by FDA under an Emergency Use Authorization (EUA). This EUA will remain  in effect (meaning this test can be used) for the duration of the COVID-19 declaration under Section 564(b)(1) of the Act, 21 U.S.C.section 360bbb-3(b)(1), unless the authorization is terminated  or revoked sooner.       Influenza A by PCR NEGATIVE NEGATIVE Final   Influenza B by PCR NEGATIVE NEGATIVE Final    Comment: (NOTE) The Xpert Xpress SARS-CoV-2/FLU/RSV plus assay is intended as an aid in the diagnosis of influenza from Nasopharyngeal swab specimens and should not be used as a sole basis for treatment. Nasal washings and aspirates are unacceptable for Xpert Xpress SARS-CoV-2/FLU/RSV testing.  Fact Sheet for Patients: BloggerCourse.com  Fact Sheet for Healthcare Providers: SeriousBroker.it  This test is not yet approved or cleared by the [place] FDA and has been authorized for detection and/or diagnosis of SARS-CoV-2 by FDA under an Emergency Use Authorization (EUA). This EUA will remain in effect (meaning this test can be used) for the duration of the COVID-19 declaration under Section 564(b)(1) of the Act, 21 U.S.C. section 360bbb-3(b)(1), unless the authorization is terminated or revoked.     Resp Syncytial Virus by PCR NEGATIVE NEGATIVE Final    Comment: (NOTE) Fact Sheet for Patients: BloggerCourse.com  Fact Sheet for Healthcare Providers: SeriousBroker.it  This test is not yet approved  or cleared by the [place] FDA and has been authorized for detection and/or diagnosis of SARS-CoV-2 by FDA under an Emergency Use Authorization (EUA). This EUA will remain in effect (meaning this test can be used) for the duration of the COVID-19 declaration under Section 564(b)(1) of the Act, 21 U.S.C. section 360bbb-3(b)(1), unless the authorization is terminated or revoked.  Performed at [facility], [address] [place], [place] [address]   MRSA Next Gen by PCR, Nasal     Status: Abnormal   Collection Time: 12/06/22  4:27 AM   Specimen: Nasal Mucosa; Nasal Swab  Result Value Ref Range Status   MRSA by PCR Next Gen DETECTED (A) NOT DETECTED Final    Comment: RESULT CALLED TO, READ BACK BY AND VERIFIED WITH: KRISTINE CHAMBERS AT 1191 12/06/22.PMF (NOTE) The GeneXpert MRSA Assay (FDA approved for NASAL specimens only), is one component of a comprehensive MRSA colonization surveillance program. It is not intended to diagnose MRSA infection nor to guide or monitor treatment for MRSA infections. Test performance is not FDA approved in patients less than 48 years old. Performed at [facility], [address], [place], [place] [address]   Cath Tip Culture     Status: Abnormal   Collection Time: 12/06/22  2:22 PM   Specimen: Catheter Tip; Other  Result Value Ref Range Status   Specimen Description   Final    CATH TIP Performed at [facility], [address] [place], [place] [address]    Special Requests   Final    NONE Performed at [facility], [address], [place], [place] [address]    Culture (A)  Final    >=100,000 COLONIES/mL METHICILLIN RESISTANT STAPHYLOCOCCUS AUREUS   Report Status 12/08/2022 FINAL  Final   Organism ID, Bacteria METHICILLIN RESISTANT STAPHYLOCOCCUS AUREUS (A)  Final      Susceptibility   Methicillin resistant staphylococcus aureus - MIC*    CIPROFLOXACIN >=8 RESISTANT Resistant     ERYTHROMYCIN >=8  RESISTANT Resistant     GENTAMICIN <=0.5 SENSITIVE Sensitive     OXACILLIN >=4 RESISTANT Resistant     TETRACYCLINE <=1 SENSITIVE Sensitive     VANCOMYCIN <=0.5 SENSITIVE Sensitive     TRIMETH/SULFA >=320 RESISTANT Resistant     CLINDAMYCIN <=0.25 SENSITIVE Sensitive     RIFAMPIN <=0.5 SENSITIVE Sensitive     Inducible Clindamycin NEGATIVE Sensitive     LINEZOLID 2 SENSITIVE Sensitive     * >=100,000 COLONIES/mL METHICILLIN RESISTANT STAPHYLOCOCCUS AUREUS  Urine Culture (for pregnant, neutropenic or urologic patients or patients with an indwelling urinary catheter)     Status: Abnormal   Collection Time: 12/06/22  5:41 PM   Specimen: Urine, Clean Catch  Result Value Ref Range Status   Specimen Description   Final    URINE, CLEAN CATCH Performed at [facility], [address] [place], [place] [address]    Special Requests   Final    Normal Performed at [facility], [address] [place], [place] [address]    Culture (A)  Final    70,000 COLONIES/mL ENTEROCOCCUS FAECALIS 70,000 COLONIES/mL YEAST    Report Status 12/09/2022 FINAL  Final   Organism ID, Bacteria ENTEROCOCCUS FAECALIS (A)  Final      Susceptibility   Enterococcus faecalis - MIC*    AMPICILLIN <=2 SENSITIVE Sensitive     NITROFURANTOIN <=16 SENSITIVE Sensitive     VANCOMYCIN 1 SENSITIVE Sensitive     * 70,000 COLONIES/mL ENTEROCOCCUS FAECALIS  Culture, blood (Routine X 2) w Reflex to ID Panel     Status: None (Preliminary result)   Collection Time: 12/07/22  1:31 PM   Specimen: BLOOD  Result Value Ref Range Status   Specimen Description BLOOD BLOOD RIGHT HAND  Final   Special Requests   Final    BOTTLES DRAWN AEROBIC ONLY Blood Culture adequate volume   Culture   Final    NO GROWTH 4 DAYS Performed at [facility], [address], [place], [place] [address]    Report Status PENDING  Incomplete  Culture, blood (Routine X 2) w Reflex to ID Panel     Status: None (Preliminary  result)   Collection Time: 12/07/22  6:41 PM   Specimen: BLOOD  Result Value Ref Range Status   Specimen Description BLOOD BLOOD RIGHT HAND  Final   Special Requests   Final    BOTTLES DRAWN AEROBIC AND ANAEROBIC Blood Culture results may not be optimal due to an inadequate volume of blood received in culture bottles   Culture   Final    NO GROWTH 4 DAYS Performed at [facility], [address] [place], [place] [address]    Report Status PENDING  Incomplete    Coagulation Studies: No results for input(s): "LABPROT", "INR" in the last 72 hours.   Urinalysis: No results for input(s): "COLORURINE", "LABSPEC", "PHURINE", "GLUCOSEU", "HGBUR", "BILIRUBINUR", "KETONESUR", "PROTEINUR", "UROBILINOGEN", "NITRITE", "LEUKOCYTESUR" in the last 72 hours.  Invalid input(s): "APPERANCEUR"     Imaging: CT CHEST ABDOMEN PELVIS WO CONTRAST  Result Date: 12/10/2022 CLINICAL  DATA:  Sepsis, bacteremia EXAM: CT CHEST, ABDOMEN AND PELVIS WITHOUT CONTRAST TECHNIQUE: Multidetector CT imaging of the chest, abdomen and pelvis was performed following the standard protocol without IV contrast. RADIATION DOSE REDUCTION: This exam was performed according to the departmental dose-optimization program which includes automated exposure control, adjustment of the mA and/or kV according to patient size and/or use of iterative reconstruction technique. COMPARISON:  CT abdomen pelvis, 06/11/2021 FINDINGS: CT CHEST FINDINGS Cardiovascular: Aortic atherosclerosis. Normal heart size. No pericardial effusion. Mediastinum/Nodes: No enlarged mediastinal, hilar, or axillary lymph nodes. Thyroid gland, trachea, and esophagus demonstrate no significant findings. Lungs/Pleura: Small bilateral pleural effusions and associated atelectasis or consolidation. Musculoskeletal: No chest wall abnormality. No acute osseous findings. CT ABDOMEN PELVIS FINDINGS Hepatobiliary: Coarse, nodular cirrhotic morphology of the liver. Rim  calcified gallstones. No gallbladder wall thickening or biliary ductal dilatation. Pancreas: Unremarkable. No pancreatic ductal dilatation or surrounding inflammatory changes. Spleen: Normal in size without significant abnormality. Adrenals/Urinary Tract: Adrenal glands are unremarkable. Multiple nonobstructive right renal calculi. No ureteral calculi or hydronephrosis. Bladder is unremarkable. Stomach/Bowel: Percutaneous gastrostomy, tip and balloon within the distal gastric body. Appendix appears normal. No evidence of bowel wall thickening, distention, or inflammatory changes. Sigmoid diverticulosis. Rectal tube. Vascular/Lymphatic: Aortic atherosclerosis. Infrarenal IVC filter. No enlarged abdominal or pelvic lymph nodes. Reproductive: Status post hysterectomy. Other: No abdominal wall hernia or abnormality. No ascites. Musculoskeletal: No acute osseous findings. IMPRESSION: 1. Small bilateral pleural effusions and associated atelectasis or consolidation. 2. No acute noncontrast CT findings of the abdomen or pelvis to explain sepsis or bacteremia. 3. Cirrhosis. 4. Cholelithiasis. 5. Nonobstructive right renal calculi. 6. Sigmoid diverticulosis without evidence of acute diverticulitis. Aortic Atherosclerosis (ICD10-I70.0). Electronically Signed   By: Jearld Lesch M.D.   On: 12/10/2022 15:35   DG Abd 1 View  Result Date: 12/09/2022 CLINICAL DATA:  Abdominal distension, hypotension EXAM: ABDOMEN - 1 VIEW COMPARISON:  10/13/2022 FINDINGS: Three supine frontal views of the abdomen and pelvis are obtained. Pubic symphysis is excluded by collimation. Percutaneous gastrostomy tube overlies the central upper abdomen. The bowel gas pattern is unremarkable without obstruction or ileus. Stable 7 mm right renal calculus. Numerous canal vein phleboliths are again noted. No abdominal mass. IVC filter again noted. Degenerative changes of the lower lumbar spine and bilateral hips. Lung bases are clear. IMPRESSION: 1.  Unremarkable bowel gas pattern. 2. Stable 7 mm right renal calculus. Electronically Signed   By: Sharlet Salina M.D.   On: 12/09/2022 22:18     Medications:    sodium chloride Stopped (12/07/22 0210)   sodium chloride Stopped (12/08/22 1823)   copper chloride 2 mg in dextrose 5 % 100 mL IVPB 2 mg (12/11/22 0826)   norepinephrine (LEVOPHED) Adult infusion 2 mcg/min (12/11/22 0307)   vancomycin      amiodarone  200 mg Per Tube BID   ascorbic acid  500 mg Per Tube BID   atorvastatin  40 mg Per Tube Daily   chlorhexidine  15 mL Mouth Rinse BID   Chlorhexidine Gluconate Cloth  6 each Topical Q0600   vitamin D3  2,000 Units Per Tube Daily   [START ON 12/18/2022] copper  2 mg Per Tube Daily   droxidopa  200 mg Per Tube TID WC   escitalopram  10 mg Per Tube Daily   famotidine  10 mg Per Tube Daily   feeding supplement (NEPRO CARB STEADY)  1,000 mL Per Tube Q24H   feeding supplement (PROSource TF20)  60 mL Per Tube Daily  free water  30 mL Per Tube Q4H   Gerhardt's butt cream   Topical BID   heparin injection (subcutaneous)  5,000 Units Subcutaneous Q8H   hydrOXYzine  50 mg Per Tube Q6H   insulin aspart  0-6 Units Subcutaneous Q4H   midodrine  15 mg Per Tube TID WC   multivitamin  1 tablet Per Tube QHS   mupirocin ointment   Nasal BID   nutrition supplement (JUVEN)  1 packet Per Tube BID BM   mouth rinse  15 mL Mouth Rinse 4 times per day   potassium & sodium phosphates  2 packet Per Tube BID   vancomycin variable dose per unstable renal function (pharmacist dosing)   Does not apply See admin instructions   sodium chloride, acetaminophen **OR** acetaminophen, artificial tears, clonazePAM, fentaNYL (SUBLIMAZE) injection, ipratropium-albuterol, loperamide HCl, magnesium hydroxide, ondansetron **OR** ondansetron (ZOFRAN) IV, mouth rinse, sennosides, traZODone  Assessment/ Plan:  [name] is a 72 y.o.  female with past medical history of diabetes, COPD, GERD, hypertension. OA,  a fib, and end stage renal disease on hemodialysis. Patient presents to the ED from her nursing facility complaining of altered mental status. She has been admitted for Lower urinary tract infectious disease [N39.0] Altered mental status, unspecified altered mental status type [R41.82] Sepsis due to gram-negative UTI (HCC) [A41.50, N39.0] Sepsis, due to unspecified organism, unspecified whether acute organ dysfunction present (HCC) [A41.9]  [facility] Price/TTS/Rt Permcath/81.3kg  End stage renal disease - Will have temp HD catheter placed today and attempt intermittent hemodialysis. Hesitant to keep catheter in with leukocytosis.  - discussed case with critical care team.   2. Hypotension due to sepsis. Blood cultures and cath tip cultures with MRSA. Repeat cultures with no growth  - requiring vasopressors.   - continue antibiotics.   3. Anemia of chronic kidney disease : PRBC transfusion on 8/26. Hemoglobin 8  - EPO with HD treatment today.   4. Secondary Hyperparathyroidism:  with hypophosphatemia.   - continue to replace phosphorus.   - holding binders.      LOS: 5 Shanen Norris 8/29/202411:06 AM

## 2022-12-11 NOTE — Consult Note (Signed)
WOC asked to re-evaluate patient, seen this admission for same (8/27).  Irritant contact dermatitis over the upper buttocks and sacrum. Partial thickness fissure in the upper aspect of the gluteal cleft related to moisture and exposure to stool Partial thickness linear skin loss under the pannus, treating with antimicrobial wicking fabric   Place patient on LALM (ICU bed) and maintain air mattress during patient admission Gherhardt's butt cream ordered, however needs to be discontinued during the use of fecal pouch Added fecal collector, attempting to contain frequent, loose stools, no rectal tone to use FMS (WOC nurse placed today with assistance of ICU staff) 1 extra left in the room  DC sacral foam dressings, loose stools are soiling the dressing making it really non valuable for the patient.   Discussed POC with bedside nurse.  Re consult if needed, will not follow at this time. Thanks  Hailynn Slovacek [employer], RN,CWOCN, CNS, CWON-AP [phone])

## 2022-12-11 NOTE — Progress Notes (Addendum)
1138- Time out complete for dialysis catheter placement by Leanord Asal, NP. Consent obtained by pt's daughter prior to procedure. Pt aware and in agree ance of catheter placement.  1146-Verbal orders given for 1mg  Versed for placement of catheter by Leanord Asal, NP.  1150-Verbal orders for 1mg  Versed given for placement of catheter by Leanord Asal, NP.  1210- Dialysis catheter placed in right femoral.

## 2022-12-11 NOTE — Procedures (Signed)
Central Venous Catheter Insertion Procedure Note  Alexandra Foster  [id number]  1951/03/14  Date:12/11/22  Time:12:18 PM   Provider Performing:Lesle Faron D Elvina Sidle   Procedure: Insertion of Non-tunneled Central Venous Catheter(36556)with US guidance (84166)    Indication(s) Medication administration, Difficult access, and Hemodialysis  Consent Risks of the procedure as well as the alternatives and risks of each were explained to the patient and/or caregiver.  Consent for the procedure was obtained and is signed in the bedside chart  Anesthesia Topical only with 1% lidocaine   Timeout Verified patient identification, verified procedure, site/side was marked, verified correct patient position, special equipment/implants available, medications/allergies/relevant history reviewed, required imaging and test results available.  Sterile Technique Maximal sterile technique including full sterile barrier drape, hand hygiene, sterile gown, sterile gloves, mask, hair covering, sterile ultrasound probe cover (if used).  Procedure Description Area of catheter insertion was cleaned with chlorhexidine and draped in sterile fashion.   With real-time ultrasound guidance a HD catheter was placed into the right femoral vein.  Nonpulsatile blood flow and easy flushing noted in all ports.  The catheter was sutured in place and sterile dressing applied.  Complications/Tolerance None; patient tolerated the procedure well. Chest X-ray is ordered to verify placement for internal jugular or subclavian cannulation.  Chest x-ray is not ordered for femoral cannulation.  EBL Minimal  Specimen(s) None   Line secured at the 30 cm mark. BIOPATCH applied to the insertion site.   Harlon Ditty, AGACNP-BC [facility] Prefer epic messenger for cross cover needs If after hours, please call E-link

## 2022-12-12 ENCOUNTER — Inpatient Hospital Stay: Payer: Medicare Other

## 2022-12-12 DIAGNOSIS — G9341 Metabolic encephalopathy: Secondary | ICD-10-CM | POA: Diagnosis not present

## 2022-12-12 DIAGNOSIS — Z992 Dependence on renal dialysis: Secondary | ICD-10-CM | POA: Diagnosis not present

## 2022-12-12 DIAGNOSIS — N186 End stage renal disease: Secondary | ICD-10-CM | POA: Diagnosis not present

## 2022-12-12 LAB — CULTURE, BLOOD (ROUTINE X 2)
Culture: NO GROWTH
Culture: NO GROWTH
Special Requests: ADEQUATE

## 2022-12-12 LAB — TRANSFERRIN: Transferrin: 159 mg/dL — ABNORMAL LOW (ref 192–382)

## 2022-12-12 LAB — RENAL FUNCTION PANEL
Albumin: 2.2 g/dL — ABNORMAL LOW (ref 3.5–5.0)
Anion gap: 17 — ABNORMAL HIGH (ref 5–15)
BUN: 67 mg/dL — ABNORMAL HIGH (ref 8–23)
CO2: 25 mmol/L (ref 22–32)
Calcium: 8.9 mg/dL (ref 8.9–10.3)
Chloride: 96 mmol/L — ABNORMAL LOW (ref 98–111)
Creatinine, Ser: 2.42 mg/dL — ABNORMAL HIGH (ref 0.44–1.00)
GFR, Estimated: 20 mL/min — ABNORMAL LOW (ref >60)
Glucose, Bld: 90 mg/dL (ref 70–99)
Phosphorus: 2 mg/dL — ABNORMAL LOW (ref 2.5–4.6)
Potassium: 3.9 mmol/L (ref 3.5–5.1)
Sodium: 138 mmol/L (ref 135–145)

## 2022-12-12 LAB — CBC
HCT: 28.5 % — ABNORMAL LOW (ref 36.0–46.0)
Hemoglobin: 8.8 g/dL — ABNORMAL LOW (ref 12.0–15.0)
MCH: 27 pg (ref 26.0–34.0)
MCHC: 30.9 g/dL (ref 30.0–36.0)
MCV: 87.4 fL (ref 80.0–100.0)
Platelets: 280 10*3/uL (ref 150–400)
RBC: 3.26 MIL/uL — ABNORMAL LOW (ref 3.87–5.11)
RDW: 20.4 % — ABNORMAL HIGH (ref 11.5–15.5)
WBC: 49.8 10*3/uL — ABNORMAL HIGH (ref 4.0–10.5)
nRBC: 9.4 % — ABNORMAL HIGH (ref 0.0–0.2)

## 2022-12-12 LAB — VITAMIN B12: Vitamin B-12: 1044 pg/mL — ABNORMAL HIGH (ref 180–914)

## 2022-12-12 LAB — IRON AND TIBC
Iron: 45 ug/dL (ref 28–170)
Saturation Ratios: 20 % (ref 10.4–31.8)
TIBC: 227 ug/dL — ABNORMAL LOW (ref 250–450)
UIBC: 182 ug/dL

## 2022-12-12 LAB — GLUCOSE, CAPILLARY
Glucose-Capillary: 130 mg/dL — ABNORMAL HIGH (ref 70–99)
Glucose-Capillary: 140 mg/dL — ABNORMAL HIGH (ref 70–99)
Glucose-Capillary: 152 mg/dL — ABNORMAL HIGH (ref 70–99)
Glucose-Capillary: 158 mg/dL — ABNORMAL HIGH (ref 70–99)
Glucose-Capillary: 194 mg/dL — ABNORMAL HIGH (ref 70–99)
Glucose-Capillary: 79 mg/dL (ref 70–99)
Glucose-Capillary: 96 mg/dL (ref 70–99)

## 2022-12-12 LAB — FOLATE: Folate: 20.7 ng/mL (ref >5.9)

## 2022-12-12 LAB — FERRITIN: Ferritin: 1642 ng/mL — ABNORMAL HIGH (ref 11–307)

## 2022-12-12 LAB — C DIFFICILE (CDIFF) QUICK SCRN (NO PCR REFLEX)
C Diff antigen: NEGATIVE
C Diff interpretation: NOT DETECTED
C Diff toxin: NEGATIVE

## 2022-12-12 LAB — MAGNESIUM: Magnesium: 1.9 mg/dL (ref 1.7–2.4)

## 2022-12-12 MED ORDER — POTASSIUM & SODIUM PHOSPHATES 280-160-250 MG PO PACK
2.0000 | PACK | Freq: Two times a day (BID) | ORAL | Status: AC
  Administered 2022-12-12 (×2): 2
  Filled 2022-12-12 (×2): qty 2

## 2022-12-12 MED ORDER — EPOETIN ALFA 10000 UNIT/ML IJ SOLN
10000.0000 [IU] | INTRAMUSCULAR | Status: DC
  Administered 2022-12-16 – 2023-01-03 (×7): 10000 [IU] via INTRAVENOUS
  Filled 2022-12-12 (×10): qty 1

## 2022-12-12 NOTE — Progress Notes (Signed)
PHARMACY CONSULT NOTE  Pharmacy Consult for Electrolyte Monitoring and Replacement   Recent Labs: Potassium (mmol/L)  Date Value  12/12/2022 3.9  08/11/2013 3.3 (L)   Magnesium (mg/dL)  Date Value  16/01/9603 1.9   Calcium (mg/dL)  Date Value  54/12/8117 8.9   Calcium, Total (mg/dL)  Date Value  14/78/2956 9.2   Albumin (g/dL)  Date Value  21/30/8657 2.2 (L)  08/11/2013 3.1 (L)   Phosphorus (mg/dL)  Date Value  84/69/6295 2.0 (L)   Sodium (mmol/L)  Date Value  12/12/2022 138  08/11/2013 138    Assessment: 72 y.o. female with medical history significant for asthma, COPD, type diabetes mellitus, s/p G-tube, GERD, ESRD on HD, hypertension, osteoarthritis, paroxysmal atrial fibrillation and OSA on CPAP, who presented to the emergency room with acute onset of altered mental status with lethargy and decreased responsiveness now on CRRT.  CRRT stopped 8/25. Plan for line holiday. CVC placed 8/29 and HD performed same day. Patient is on tube feeds and having diarrhea. Hypophosphatemia suspected secondary to re-feeding / diarrhea.   Goal of Therapy:  Electrolytes WNL  Plan:  --Phos 2, give Phos-Nak 2 packets per tube x 2 doses --Re-check electrolytes again tomorrow AM  Alexandra Foster 12/12/2022 8:13 AM

## 2022-12-12 NOTE — Progress Notes (Signed)
Pharmacy Antibiotic Note  Alexandra Foster is a 72 y.o. female admitted on 12/05/2022 with bacteremia.  Bcx 4 of 4 GPC, BCID showing MRSA and Staph epidermidis. Patient started on CRRT 8/24 - stopped 8/25. Pharmacy has been consulted for vancomycin dosing and monitoring.   Today, 12/12/2022 Day # 7 vancomycin Renal: on HD prior to admission, briefly on CRRT 8/24-8/25. Her previous HD jugular catheter was removed  8/24 d/t concern for infection.  She is currently on line holiday (femoral line placed for CRRT removed 8/27). CVC inserted 8/29 and HD was performed.  Still makes some Urine Afebrile WBC 49.8 (trending up) 8/23 Bcx MRSA, repeat Bcx 8/25 NGTD 8/24 cath tip: MRSA  Date HD (Y/N) Level Plan  8/24 CRRT  Vanc 1750 mg  8/25 CRRT  Vanc 1000 mg  8/26 N 22   8/27 N  Vanc 750 mg  8/28 N    8/29 Y 22 Vanc 1000 mg  8/30 N      Plan: Per nephrology, no plan for HD 8/30. Follow-up HD schedule per nephrology. Plan for vancomycin 1 g qHD. Still makes some urine and due to non-renal clearance may need level checked before next HD  Height: 5\' 4"  (162.6 cm) Weight: 91.3 kg (201 lb 4.5 oz) IBW/kg (Calculated) : 54.7  Temp (24hrs), Avg:98.4 F (36.9 C), Min:97.9 F (36.6 C), Max:98.9 F (37.2 C)  Recent Labs  Lab 12/05/22 2056 12/06/22 0016 12/06/22 0354 12/06/22 0404 12/06/22 0734 12/06/22 0735 12/06/22 1257 12/06/22 1614 12/08/22 0613 12/08/22 1130 12/09/22 0417 12/10/22 0551 12/11/22 0557 12/12/22 0512  WBC 21.4*  --   --  16.0*   < >  --   --    < > 34.5*  --  33.7* 38.8* 36.8* 49.8*  CREATININE 4.53*  --    < > 4.23*  --   --   --    < > 2.57*  --  2.88* 3.31* 3.57* 2.42*  LATICACIDVEN 2.3* 2.9*  --  2.0*  --  1.9 1.7  --   --   --   --   --   --   --   VANCORANDOM  --   --   --   --   --   --   --   --   --  22  --   --  22  --    < > = values in this interval not displayed.    Estimated Creatinine Clearance: 22.3 mL/min (A) (by C-G formula based on SCr of 2.42  mg/dL (H)).    No Known Allergies  Antimicrobials this admission: 8/23 Azithromycin / CRO x 1 8/24 Cefepime >> 8/26 8/24 Vancomycin >>   Microbiology results: 8/23 BCx: 4/4 MRSA 8/23 Cath tip: MRSA 8/24 MRSA PCR: Positive   Thank you for allowing pharmacy to be a part of this patient's care.  Tressie Ellis 12/12/2022 11:10 AM

## 2022-12-12 NOTE — Progress Notes (Signed)
NAME:  Alexandra Foster, MRN:  [id number], DOB:  14-Mar-1951, LOS: 6 ADMISSION DATE:  12/05/2022, CONSULTATION DATE:  12/06/2022 REFERRING MD:  Dr. Wynelle Link, CHIEF COMPLAINT:  Hypotension   Brief Pt Description / Synopsis:  72 y.o female with PMHx significant for ESRD on HD admitted with Acute Metabolic Encephalopathy in setting of Severe Sepsis with Septic Shock due to MRSA Bacteremia from infected right chest Permcath, along with Enterococcus Faecalis UTI.  History of Present Illness:  Alexandra Foster is a 72 y.o. female with past medical history significant for asthma, COPD, type diabetes mellitus, s/p G-tube, GERD, ESRD on HD, hypertension, osteoarthritis, paroxysmal atrial fibrillation and OSA on CPAP, who presented to [facility] ED on 12/05/22 from [facility] due to altered mental status with lethargy and decreased responsiveness.  There was a concern about aspiration pneumonia at his facility as the patient had an episode of nausea and vomiting and was reportedly coughing per her sister without wheezing or dyspnea or hypoxia.  The patient's temperature was 102.2 when EMS arrived to this facility.  The patient was not responding to questions throughout transport.  EMS noted the patient having slurred speech and significant weakness per family (family had not seen her since last week).  Code stroke was called upon arrival to the ED. teleneurology evaluated the patient, was not considered a candidate for thrombolytics given unclear last known well time, along with low suspicion for stroke given significantly global symptoms and multiple metabolic derangements and sepsis contributing to encephalopathy.  ED Course: Initial Vital Signs: Temperature 101.4 F orally, pulse 78, respiratory rate 19, blood pressure 125/70, SpO2 99% on room air Significant Labs: hyponatremia 128 and hypochloremia of 90 with blood glucose of 197, BUN of 67 and creatinine 4.53 with CO2 of 22 and anion gap 16. Alk phos was  157 and albumin 3.3, AST 66 and ALT 87 with total obtained of 8.6. Lactic acid was 2.3 and later 2.9. CBC showed leukocytosis of 21.4 with neutrophilia and anemia. Respiratory panel including influenza A, B, RSV and COVID-19 PCR came back negative. UA was positive for UTI alcohol level was less than 10. Urine drug screen was positive for tricyclic's.  Imaging Chest X-ray>>IMPRESSION: Mild vascular congestion likely related to volume overload. CT Head w/o contrast>>IMPRESSION: 1. No acute intracranial abnormality. 2. ASPECTS is 10. Medications Administered: IV Rocephin and Zithromax as well as 3 L bolus of IV normal saline   Hospitalist were asked to admit for further workup and treatment.  Nephrology consulted for hemodialysis.  Please see "significant hospital events" section below for full detailed hospital course.  Pertinent  Medical History   Past Medical History:  Diagnosis Date   AKI (acute kidney injury) (HCC)    a. 04/2021 in setting of bacteremia/shock.   Arthritis    Asthma    Bacteremia    a. 04/2021 S pyogenes bacteremia in setting of lower ext cellulitis.   COPD (chronic obstructive pulmonary disease) (HCC)    Diabetes mellitus without complication (HCC)    Endometriosis    GERD (gastroesophageal reflux disease)    History of echocardiogram    a. 07/2013 Echo: EF 55-60%, impaired relaxation, mild TR; b. 04/2021 Echo: EF 50-55%, mild LVH, nl RV fxn, mild BAE, Ao sclerosis w/o stenosis.   Hypertension    Obesity    PAF (paroxysmal atrial fibrillation) (HCC)    a. 04/2021 in setting of septic shock/cellulitis.   Sleep apnea    CPAP    Micro Data:  8/23:  SARS-CoV-2/RSV/Flu PCR>>negative 8/23: Blood cultures>> 4/4 bottles with MRSA & Staph epidermidis 8/23: MRSA PCR>> positive 8/23: Urine>> Enterococcus Faecalis 8/24: Permcath tip>> MRSA 8/25: Repeat Blood cultures>> no growth  Antimicrobials:   Anti-infectives (From admission, onward)    Start     Dose/Rate Route  Frequency Ordered Stop   12/11/22 2345  Ampicillin-Sulbactam (UNASYN) 3 g in sodium chloride 0.9 % 100 mL IVPB        3 g 200 mL/hr over 30 Minutes Intravenous Every 12 hours 12/11/22 2252     12/11/22 1800  vancomycin (VANCOCIN) IVPB 1000 mg/200 mL premix        1,000 mg 200 mL/hr over 60 Minutes Intravenous  Once 12/11/22 1025 12/12/22 0635   12/09/22 1800  vancomycin (VANCOREADY) IVPB 750 mg/150 mL        750 mg 150 mL/hr over 60 Minutes Intravenous  Once 12/09/22 1423 12/09/22 1848   12/08/22 1040  vancomycin variable dose per unstable renal function (pharmacist dosing)         Does not apply See admin instructions 12/08/22 1041     12/07/22 1400  vancomycin (VANCOCIN) IVPB 1000 mg/200 mL premix  Status:  Discontinued        1,000 mg 200 mL/hr over 60 Minutes Intravenous Every 24 hours 12/06/22 1234 12/08/22 1041   12/06/22 2200  cefTRIAXone (ROCEPHIN) 2 g in sodium chloride 0.9 % 100 mL IVPB  Status:  Discontinued        2 g 200 mL/hr over 30 Minutes Intravenous Every 24 hours 12/06/22 0132 12/06/22 1223   12/06/22 1500  ceFEPIme (MAXIPIME) 2 g in sodium chloride 0.9 % 100 mL IVPB  Status:  Discontinued        2 g 200 mL/hr over 30 Minutes Intravenous Every 12 hours 12/06/22 1252 12/08/22 1040   12/06/22 1315  vancomycin (VANCOREADY) IVPB 1750 mg/350 mL        1,750 mg 175 mL/hr over 120 Minutes Intravenous  Once 12/06/22 1224 12/06/22 1724   12/05/22 2130  cefTRIAXone (ROCEPHIN) 2 g in sodium chloride 0.9 % 100 mL IVPB  Status:  Discontinued        2 g 200 mL/hr over 30 Minutes Intravenous Every 24 hours 12/05/22 2115 12/06/22 0202   12/05/22 2130  azithromycin (ZITHROMAX) 500 mg in sodium chloride 0.9 % 250 mL IVPB  Status:  Discontinued        500 mg 250 mL/hr over 60 Minutes Intravenous Every 24 hours 12/05/22 2115 12/06/22 1223        Significant Hospital Events: Including procedures, antibiotic start and stop dates in addition to other pertinent events   8/23:  Presented from [facility] due to AMS, Code Stroke called due to slurred speech and weakness. Deemed not a candidate for thrombolytics due to no clear last known normal time.  Admitted by [facility] with Nephrology consultation for dialysis. 8/24: Rapid response called due to Hypotension prior to/during Hemodialysis.  Transfer to ICU for peripheral vasopressors and initiation of CRRT.  PCCM consulted to assist with vasopressors.  Blood cultures + for MRSA & Staph Epi. ID consulted. Vascular Surgery consulted, permcath removed.  Temporary HD catheter placed. 8/24: this morning with pronounce left facial droop and dysarthria, remains on CRRT on Levo @8  keep MAP>55 12/08/22: patient with multispecies sepsis, removing central line today due to bacteremia. Holding HD as per nephrology team due to shock.  12/09/22-patient with loose watery BM today, she does have skin breakdown over abd pannus.  She had re-initiation of her psychiatric medications today.  S/p pRBC transfusion overnight. Electrolytes are being repleted.  On room air but requires levophed still.  12/10/22- patient laying in bed with no acute distress.  Levophed weaned to 72mcg/kg/min, afebrile on room air.  Handling NGT feeds well.  Purewick with no UOP this am. 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  12/11/22- patient off levophed , requires HD today, will place catheter per renal team.  Wound care to see for anal wound.  With episode of emesis during HD and concern for possible aspiration. 12/12/22- Back on low dose Levophed, weaning as able. Worsening Leukocytosis, ID plans to check for C.diff.  Ongoing discussions of when to remove newly placed temporary HD catheter.  Interim History / Subjective:  -Yesterday evening during HD, she had episode of vomiting ~ feeds were held, Unasyn started for potential aspiration ~ KUB with nonobstructive gas pattern -Afebrile, back on low dose levophed (2 mcg) -Worsening Leukocytosis up to 49.8 from 36, ?  R/t suspected aspiration vs ? C.diff with persistent diarrhea  ~ ID is following, plans to check for C.diff   Objective   Blood pressure (!) 86/45, pulse 66, temperature 98.9 F (37.2 C), resp. rate 18, height 5\' 4"  (1.626 m), weight 91.3 kg, SpO2 100%.        Intake/Output Summary (Last 24 hours) at 12/12/2022 1610 Last data filed at 12/12/2022 0800 Gross per 24 hour  Intake 2521.61 ml  Output 500 ml  Net 2021.61 ml   Filed Weights   12/10/22 0500 12/11/22 0420 12/12/22 0440  Weight: 89.5 kg 90 kg 91.3 kg    Examination: General: Acute on chronically ill-appearing obese female, laying in bed, on room air, no acute distress HENT: Atraumatic, normocephalic, neck supple, difficult to assess JVD due to body habitus Lungs: Diminished breath sounds throughout, even, nonlabored, normal effort Cardiovascular: Regular rate and rhythm, S1-S2, no murmurs, rubs, gallops Abdomen: Obese, soft, nontender, nondistended, no guarding or rebound tenderness, bowel sounds positive x 4 Extremities: Prior left AKA, 1+ edema to right lower extremity, no cyanosis Neuro: Lethargic, arouses easily to voice, oriented to person and place, moves extremities to command (left side slightly weaker, mild to moderate dysarthria, left facial droop which family reports is baseline) GU: Incontinent of urine  Resolved Hospital Problem list     Assessment & Plan:   #Septic Shock #Chronic Hypotension (on midodrine outpatient) PMHx: Paroxsymal Atrial Fibrillation, HTN Echocardiogram 12/07/2022: LVEF 60-65%, RV systolic function low normal, RV size mildly enlarged, trivial mitral regurgitation -Continuous cardiac monitoring -Maintain MAP >55 per Nephrology -Cautious IV fluids due to ESRD -Vasopressors as needed to maintain MAP goal -Continue Midodrine & Droxidopa -Lactic acid is normalized -Trend HS Troponin until peaked -Cortisol is 35.9 -Continue outpatient Amiodarone  #Severe Sepsis due to MRSA BACTEREMIA  due to infected Permcath & Enterococcus Faecalis UTI #Suspected Aspiration Permcath was removed on 8/24 2D Echocardiogram without evidence of vegetations -Monitor fever curve -Trend WBC's & Procalcitonin -Follow cultures as above -ID following appreciate input ~continue Unasyn &  Vancomycin pending cultures & sensitivities -ID recommends TEE ~ but pt is extremely high risk given hypotension, would be difficult to wean from ventilatory ~ thus recommends 6 weeks of Vancomycin since unable to undergo TEE -ID given approval to check for C. Diff on 8/30; ongoing discussions of when to remove newly placed Temporary HD catheter (placed on 8/29)  #ESRD on Hemodialysis #Mild Hyponatremia ~ RESOLVED -Monitor I&O's / urinary output -Follow BMP -Ensure adequate  renal perfusion -Avoid nephrotoxic agents as able -Replace electrolytes as indicated ~ Pharmacy following for assistance with electrolyte replacement -Nephrology following, appreciate input: HD vs CRRT per recommendations  #COPD without acute exacerbation #Concern for Aspiration #OSA, uses CPAP at home -Supplemental O2 as needed to maintain O2 sats 88 to 92% -CPAP qhs -Follow intermittent Chest X-ray & ABG as needed -Bronchodilators prn -Pulmonary toilet as able -ABX as above  #Anemia of chronic kidney disease -Monitor for S/Sx of bleeding -Trend CBC -Heparin SQ for VTE Prophylaxis  -Transfuse for Hgb <7  #Type 2 diabetes mellitus -CBG's q4h; Target range of 140 to 180 -SSI -Follow ICU Hypo/Hyperglycemia protocol -Hold outpatient metformin  #Acute Metabolic Encephalopathy in setting of sepsis and multiple metabolic derangements #? TIA (left sided weakness, left facial droop) PMHx: Depression and Anxiety CT Head 8/23 negative for acute intracranial abnormality Code stroke called 8/23, deemed not a candidate for thrombolytics given unclear last known well time CTA head and neck reviewed and shows no acute intracranial  abnormality or LVO -Evaluated by inpatient Neurology on 8/25 ~ Recommendations included: "-Appreciate neurosurgery confirmation that anticoagulation can be resumed from their perspective, secure chat sent to neurosurgeon on-call -Echocardiogram pending, may need TEE if TTE is negative to definitively exclude endocarditis (defer to primary team, cardiology and infectious disease on risk/benefit) -Anticoagulation for stroke risk prevention once endocarditis is ruled out or resolved, and after neurosurgical clearance -LDL goal less than 70, A1c goal less than 7% (A1c and lipid panel ordered), please adjust medications as needed -Carotid ultrasound -Outpatient follow-up for OSA/CPAP versus inpatient trial as this can increase stroke risk if untreated -Outpatient follow-up for pancreatic lesion as malignancy can increase stroke risk -Outpatient follow-up for IVC filter, could potentially be removed if anticoagulation is reinstituted" -Treatment of Sepsis and metabolic derangements as outlined above -Provide supportive care -Promote normal sleep/wake cycle and family presence -Avoid sedating medications as able -Hold Klonopin, Buspar, Lexapro for now      Patient is critically ill with bacteremia and shock.  Prognosis is guarded, high risk for further decompensation, cardiac arrest and death.  Given current illness superimposed on advanced age and multiple chronic comorbidities, overall long-term prognosis is poor.  Recommend DNR status.  Palliative Care was consulted but has since signed off as pt and family very clear they wish for full code and all aggressive measures.   Best Practice (right click and "Reselect all SmartList Selections" daily)   Diet/type: NPO, tube feeds DVT prophylaxis: prophylactic heparin  GI prophylaxis: PPI Lines: Right femoral Trialysis and is still needed Foley:  N/A Code Status:  full code Last date of multidisciplinary goals of care discussion [8/30]  8/30: Pt  and her daughter [name] updated at bedside.  Labs   CBC: Recent Labs  Lab 12/05/22 2056 12/06/22 0404 12/08/22 9604 12/08/22 1656 12/08/22 2217 12/09/22 0417 12/10/22 0551 12/11/22 0557 12/12/22 0512  WBC 21.4*   < > 34.5*  --   --  33.7* 38.8* 36.8* 49.8*  NEUTROABS 19.4*  --   --   --   --   --   --   --   --   HGB 10.2*   < > 6.7*   < > 7.6* 7.4* 7.4* 8.0* 8.8*  HCT 33.4*   < > 21.7*   < > 23.2* 22.7* 23.1* 25.3* 28.5*  MCV 84.3   < > 83.8  --   --  83.5 85.6 86.9 87.4  PLT 411*   < > 256  --   --  213 244 301 280   < > = values in this interval not displayed.    Basic Metabolic Panel: Recent Labs  Lab 12/08/22 0525 12/08/22 8469 12/09/22 0417 12/09/22 1256 12/09/22 1835 12/10/22 0551 12/11/22 0557 12/12/22 0512  NA 130* 132* 132*  --   --  135 139 138  K 3.6 3.7 2.9* 3.0* 4.2 3.5 2.9* 3.9  CL 100 102 102  --   --  103 105 96*  CO2 21* 20* 20*  --   --  20* 23 25  GLUCOSE 178* 184* 191*  --   --  179* 132* 90  BUN 41* 38* 57*  --   --  89* 99* 67*  CREATININE 2.38* 2.57* 2.88*  --   --  3.31* 3.57* 2.42*  CALCIUM 8.2* 8.2* 8.4*  --   --  8.6* 8.9 8.9  MG 2.3 2.2 2.4  --   --   --  2.2 1.9  PHOS 1.7*  1.6* 1.7* 1.7* 1.1* 1.1* 1.1* 1.7* 2.0*   GFR: Estimated Creatinine Clearance: 22.3 mL/min (A) (by C-G formula based on SCr of 2.42 mg/dL (H)). Recent Labs  Lab 12/06/22 0016 12/06/22 0404 12/06/22 0734 12/06/22 0735 12/06/22 1257 12/07/22 0213 12/08/22 0525 12/08/22 6295 12/09/22 0417 12/10/22 0551 12/11/22 0557 12/12/22 0512  PROCALCITON  --  2.01  --   --   --  1.51 4.43  --  4.32  --   --   --   WBC  --  16.0*   < >  --   --  22.5* 34.6*   < > 33.7* 38.8* 36.8* 49.8*  LATICACIDVEN 2.9* 2.0*  --  1.9 1.7  --   --   --   --   --   --   --    < > = values in this interval not displayed.    Liver Function Tests: Recent Labs  Lab 12/05/22 2056 12/06/22 0354 12/08/22 0525 12/09/22 0417 12/10/22 0551 12/11/22 0557 12/12/22 0512  AST 66*  --    --   --   --   --   --   ALT 87*  --   --   --   --   --   --   ALKPHOS 157*  --   --   --   --   --   --   BILITOT 0.6  --   --   --   --   --   --   PROT 8.6*  --   --   --   --   --   --   ALBUMIN 3.3*   < > 2.0* 2.1* 2.2* 2.1* 2.2*   < > = values in this interval not displayed.   No results for input(s): "LIPASE", "AMYLASE" in the last 168 hours. No results for input(s): "AMMONIA" in the last 168 hours.  ABG    Component Value Date/Time   PHART 7.40 05/14/2021 0341   PCO2ART 45 05/14/2021 0341   PO2ART 129 (H) 05/14/2021 0341   HCO3 27.9 05/14/2021 0341   ACIDBASEDEF 12.8 (H) 05/11/2021 0603   O2SAT 98.9 05/14/2021 0341     Coagulation Profile: Recent Labs  Lab 12/05/22 2056 12/06/22 0404  INR 1.1 1.2    Cardiac Enzymes: No results for input(s): "CKTOTAL", "CKMB", "CKMBINDEX", "TROPONINI" in the last 168 hours.  HbA1C: Hgb A1c MFr Bld  Date/Time Value Ref Range Status  12/07/2022 02:13 AM 5.2 4.8 - 5.6 %  Final    Comment:    (NOTE) Pre diabetes:          5.7%-6.4%  Diabetes:              >6.4%  Glycemic control for   <7.0% adults with diabetes   05/05/2021 04:53 AM 6.7 (H) 4.8 - 5.6 % Final    Comment:    (NOTE)         Prediabetes: 5.7 - 6.4         Diabetes: >6.4         Glycemic control for adults with diabetes: <7.0     CBG: Recent Labs  Lab 12/11/22 1612 12/11/22 2001 12/12/22 0027 12/12/22 0340 12/12/22 0757  GLUCAP 107* 111* 194* 79 96    Review of Systems:   Positives in BOLD: Gen: Denies fever, chills, weight change, fatigue, night sweats HEENT: Denies blurred vision, double vision, hearing loss, tinnitus, sinus congestion, rhinorrhea, sore throat, neck stiffness, dysphagia PULM: Denies shortness of breath, cough, sputum production, hemoptysis, wheezing CV: Denies chest pain, edema, orthopnea, paroxysmal nocturnal dyspnea, palpitations GI: Denies abdominal pain, nausea, vomiting, diarrhea, hematochezia, melena, constipation, change  in bowel habits GU: Denies dysuria, hematuria, polyuria, oliguria, urethral discharge Endocrine: Denies hot or cold intolerance, polyuria, polyphagia or appetite change Derm: Denies rash, dry skin, scaling or peeling skin change Heme: Denies easy bruising, bleeding, bleeding gums Neuro: Denies headache, numbness, weakness, slurred speech, loss of memory or consciousness   Past Medical History:  She,  has a past medical history of AKI (acute kidney injury) (HCC), Arthritis, Asthma, Bacteremia, COPD (chronic obstructive pulmonary disease) (HCC), Diabetes mellitus without complication (HCC), Endometriosis, GERD (gastroesophageal reflux disease), History of echocardiogram, Hypertension, Obesity, PAF (paroxysmal atrial fibrillation) (HCC), and Sleep apnea.   Surgical History:   Past Surgical History:  Procedure Laterality Date   ABDOMINAL HYSTERECTOMY     APPLICATION OF WOUND VAC Left 05/17/2021   Procedure: APPLICATION OF WOUND VAC/WOUND VAC EXCHANGE-Matrix Myriad;  Surgeon: Carolan Shiver, MD;  Location: [facility] OR[facility];  Service: General;  Laterality: Left;   APPLICATION OF WOUND VAC  05/24/2021   Procedure: APPLICATION OF WOUND VAC;  Surgeon: Carolan Shiver, MD;  Location: [facility];  Service: General;;   APPLICATION OF WOUND VAC  05/10/2021   Procedure: APPLICATION OF WOUND VAC;  Surgeon: Carolan Shiver, MD;  Location: [facility];  Service: General;;   COLONOSCOPY  10/29/2006   Dr Servando Snare   COLONOSCOPY WITH PROPOFOL N/A 11/05/2016   Procedure: COLONOSCOPY WITH PROPOFOL;  Surgeon: Earline Mayotte, MD;  Location: [facility];  Service: Endoscopy;  Laterality: N/A;   INCISION AND DRAINAGE OF WOUND Left 05/24/2021   Procedure: IRRIGATION AND DEBRIDEMENT LEFT LEG;  Surgeon: Carolan Shiver, MD;  Location: [facility];  Service: General;  Laterality: Left;   INCISION AND DRAINAGE OF WOUND Left 05/10/2021   Procedure: IRRIGATION AND DEBRIDEMENT LEFT LEG;  Surgeon: Carolan Shiver, MD;  Location: [facility];  Service: General;  Laterality: Left;   IR FLUORO GUIDE CV LINE RIGHT  06/12/2021   IR PERC TUN PERIT CATH WO PORT S&I /IMAG  06/12/2021   IR REPLC GASTRO/COLONIC TUBE PERCUT W/FLUORO  06/12/2021   IVC FILTER INSERTION N/A 05/29/2021   Procedure: IVC FILTER INSERTION;  Surgeon: Renford Dills, MD;  Location: [facility];  Service: Cardiovascular;  Laterality: N/A;   MINOR GRAFT APPLICATION  05/24/2021   Procedure: Myriad Matrix  APPLICATION;  Surgeon: Carolan Shiver, MD;  Location: [facility];  Service: General;;  NASAL SINUS SURGERY  2002   Dr Chestine Spore   PEG PLACEMENT N/A 05/28/2021   Procedure: PERCUTANEOUS ENDOSCOPIC GASTROSTOMY (PEG) PLACEMENT;  Surgeon: Sung Amabile, DO;  Location: [facility];  Service: General;  Laterality: N/A;  TRAVEL CASE   TRACHEOSTOMY TUBE PLACEMENT N/A 05/17/2021   Procedure: TRACHEOSTOMY;  Surgeon: Geanie Logan, MD;  Location: [facility];  Service: ENT;  Laterality: N/A;   WOUND DEBRIDEMENT Left 05/07/2021   Procedure: DEBRIDEMENT WOUND;  Surgeon: Carolan Shiver, MD;  Location: [facility];  Service: General;  Laterality: Left;     Social History:   reports that she quit smoking about 21 years ago. Her smoking use included cigarettes. She started smoking about 61 years ago. She has a 80 pack-year smoking history. She quit smokeless tobacco use about 21 years ago.  Her smokeless tobacco use included snuff. She reports that she does not drink alcohol and does not use drugs.   Family History:  Her family history includes Alcohol abuse in her brother; Breast cancer (age of onset: 24) in her sister; Congestive Heart Failure in her mother; Coronary artery disease (age of onset: 68) in her father; Heart disease in her brother; Varicose Veins in her brother.   Allergies No Known Allergies   Home Medications  Prior to Admission medications   Medication Sig Start Date End Date Taking? Authorizing Provider  amiodarone  (PACERONE) 200 MG tablet Place 1 tablet (200 mg total) into feeding tube 2 (two) times daily. 06/13/21  Yes Erin Fulling, MD  ascorbic acid (VITAMIN C) 500 MG tablet Place 1 tablet (500 mg total) into feeding tube 2 (two) times daily. 06/13/21  Yes Kasa, Wallis Bamberg, MD  busPIRone (BUSPAR) 5 MG tablet Take 5 mg by mouth 2 (two) times daily. 12/04/22  Yes [provider]  cholecalciferol (VITAMIN D) 25 MCG tablet Place 2 tablets (2,000 Units total) into feeding tube daily. 06/14/21  Yes Kasa, Wallis Bamberg, MD  clonazePAM (KLONOPIN) 0.5 MG tablet Take 0.25-0.5 mg by mouth 2 (two) times daily. 0.25 mg every morning and 0.5 mg at bedtime for Agitation 11/18/22  Yes [provider]  escitalopram (LEXAPRO) 10 MG tablet Place 1 tablet (10 mg total) into feeding tube daily. 06/14/21  Yes Erin Fulling, MD  hydrOXYzine (ATARAX) 50 MG tablet Take 100 mg by mouth 2 (two) times daily. 12/02/22  Yes [provider]  lip balm (BLISTEX) OINT Apply 1 application topically 3 (three) times daily. 06/13/21  Yes Erin Fulling, MD  loperamide (IMODIUM) 2 MG capsule Take 1 capsule (2 mg total) by mouth as needed for diarrhea or loose stools. 06/13/21  Yes Erin Fulling, MD  midodrine (PROAMATINE) 5 MG tablet Place 3 tablets (15 mg total) into feeding tube 3 (three) times daily with meals. 06/13/21  Yes Erin Fulling, MD  multivitamin (RENA-VIT) TABS tablet Place 1 tablet into feeding tube at bedtime. 06/13/21  Yes Erin Fulling, MD  omeprazole (PRILOSEC) 20 MG capsule Take 20 mg by mouth daily. 11/12/22  Yes [provider]  sennosides (SENOKOT) 8.8 MG/5ML syrup Place 10 mLs into feeding tube at bedtime as needed for moderate constipation. 06/13/21  Yes Erin Fulling, MD  artificial tears (LACRILUBE) OINT ophthalmic ointment Place into both eyes every 4 (four) hours as needed for dry eyes. Patient not taking: Reported on 12/06/2022 06/13/21   Erin Fulling, MD  chlorhexidine gluconate, MEDLINE KIT, (PERIDEX) 0.12 % solution 15 mLs  by Mouth Rinse route 2 (two) times daily. Patient not taking: Reported on 12/06/2022 06/13/21  Erin Fulling, MD  collagenase (SANTYL) ointment Apply topically daily. Patient not taking: Reported on 12/06/2022 06/14/21   Erin Fulling, MD  dextrose 5 % SOLN 100 mL with copper chloride 0.4 MG/ML SOLN 2 mg Inject 2 mg into the vein daily. 06/14/21   Erin Fulling, MD  ipratropium-albuterol (DUONEB) 0.5-2.5 (3) MG/3ML SOLN Take 3 mLs by nebulization 3 (three) times daily. Patient not taking: Reported on 12/06/2022 06/13/21   Erin Fulling, MD  lactobacillus (FLORANEX/LACTINEX) PACK Place 1 packet (1 g total) into feeding tube 3 (three) times daily with meals. Patient not taking: Reported on 12/06/2022 06/13/21   Erin Fulling, MD  pantoprazole sodium (PROTONIX) 40 mg Place 40 mg into feeding tube daily. Patient not taking: Reported on 12/06/2022 06/13/21   Erin Fulling, MD  Scheduled Meds:  amiodarone  200 mg Per Tube BID   ascorbic acid  500 mg Per Tube BID   atorvastatin  40 mg Per Tube Daily   chlorhexidine  15 mL Mouth Rinse BID   Chlorhexidine Gluconate Cloth  6 each Topical Q0600   vitamin D3  2,000 Units Per Tube Daily   [START ON 12/18/2022] copper  2 mg Per Tube Daily   droxidopa  200 mg Per Tube TID WC   escitalopram  10 mg Per Tube Daily   famotidine  10 mg Per Tube Daily   feeding supplement (NEPRO CARB STEADY)  1,000 mL Per Tube Q24H   feeding supplement (PROSource TF20)  60 mL Per Tube Daily   free water  30 mL Per Tube Q4H   Gerhardt's butt cream   Topical BID   heparin injection (subcutaneous)  5,000 Units Subcutaneous Q8H   hydrOXYzine  50 mg Per Tube Q6H   insulin aspart  0-6 Units Subcutaneous Q4H   midodrine  15 mg Per Tube TID WC   multivitamin  1 tablet Per Tube QHS   mupirocin ointment   Nasal BID   nutrition supplement (JUVEN)  1 packet Per Tube BID BM   mouth rinse  15 mL Mouth Rinse 4 times per day   potassium & sodium phosphates  2 packet Per Tube BID   vancomycin variable dose  per unstable renal function (pharmacist dosing)   Does not apply See admin instructions   Continuous Infusions:  sodium chloride Stopped (12/07/22 0210)   sodium chloride Stopped (12/08/22 1823)   ampicillin-sulbactam (UNASYN) IV Stopped (12/12/22 0226)   anticoagulant sodium citrate     copper chloride 2 mg in dextrose 5 % 100 mL IVPB Stopped (12/11/22 1026)   norepinephrine (LEVOPHED) Adult infusion 2 mcg/min (12/12/22 0800)   PRN Meds:.sodium chloride, acetaminophen **OR** acetaminophen, alteplase, alteplase, anticoagulant sodium citrate, artificial tears, clonazePAM, feeding supplement (NEPRO CARB STEADY), fentaNYL (SUBLIMAZE) injection, heparin, heparin, ipratropium-albuterol, lidocaine (PF), lidocaine-prilocaine, loperamide HCl, magnesium hydroxide, ondansetron **OR** ondansetron (ZOFRAN) IV, mouth rinse, pentafluoroprop-tetrafluoroeth, sennosides, traZODone   Critical care time: 40 minutes    Harlon Ditty, AGACNP-BC [facility] Prefer epic messenger for cross cover needs If after hours, please call E-link

## 2022-12-12 NOTE — Progress Notes (Addendum)
Pt had episode of emesis during dialysis. Pt turned on side and suctioned. Tube feeds stopped.  Cheryll Cockayne NP to bedside. Pt will be started on unasyn and chest xray in am in case of aspiration. Pt with no distress after episode.

## 2022-12-12 NOTE — Plan of Care (Signed)
  Problem: Clinical Measurements: Goal: Diagnostic test results will improve Outcome: Progressing Goal: Signs and symptoms of infection will decrease Outcome: Progressing   Problem: Respiratory: Goal: Ability to maintain adequate ventilation will improve Outcome: Progressing   

## 2022-12-12 NOTE — Progress Notes (Signed)
Date of Admission:  12/05/2022     ID: Alexandra Foster is a 72 y.o. female  Principal Problem:   Septic shock (HCC) Active Problems:   MRSA bacteremia   Acute metabolic encephalopathy   Paroxysmal atrial fibrillation (HCC)   Anxiety and depression   Type 2 diabetes mellitus with chronic kidney disease, with long-term current use of insulin (HCC)   ESRD on hemodialysis (HCC)    Subjective: Pt had vomiting while having dialysis yesterday Concern for aspiration She does not voice any issues  Medications:   amiodarone  200 mg Per Tube BID   ascorbic acid  500 mg Per Tube BID   atorvastatin  40 mg Per Tube Daily   chlorhexidine  15 mL Mouth Rinse BID   Chlorhexidine Gluconate Cloth  6 each Topical Q0600   vitamin D3  2,000 Units Per Tube Daily   [START ON 12/18/2022] copper  2 mg Per Tube Daily   droxidopa  200 mg Per Tube TID WC   [START ON 12/13/2022] epoetin (EPOGEN/PROCRIT) injection  10,000 Units Intravenous Q T,Th,Sa-HD   escitalopram  10 mg Per Tube Daily   famotidine  10 mg Per Tube Daily   feeding supplement (NEPRO CARB STEADY)  1,000 mL Per Tube Q24H   feeding supplement (PROSource TF20)  60 mL Per Tube Daily   free water  30 mL Per Tube Q4H   Gerhardt's butt cream   Topical BID   heparin injection (subcutaneous)  5,000 Units Subcutaneous Q8H   hydrOXYzine  50 mg Per Tube Q6H   insulin aspart  0-6 Units Subcutaneous Q4H   midodrine  15 mg Per Tube TID WC   multivitamin  1 tablet Per Tube QHS   mupirocin ointment   Nasal BID   nutrition supplement (JUVEN)  1 packet Per Tube BID BM   mouth rinse  15 mL Mouth Rinse 4 times per day   potassium & sodium phosphates  2 packet Per Tube BID   vancomycin variable dose per unstable renal function (pharmacist dosing)   Does not apply See admin instructions    Objective: Vital signs in last 24 hours: Patient Vitals for the past 24 hrs:  BP Temp Temp src Pulse Resp SpO2 Weight  12/12/22 1900 (!) 95/50 -- -- 72 15 97 %  --  12/12/22 1800 (!) 100/35 -- -- 72 (!) 22 99 % --  12/12/22 1700 (!) 83/43 -- -- 65 19 97 % --  12/12/22 1630 91/76 -- -- 66 19 99 % --  12/12/22 1600 (!) 89/76 98.4 F (36.9 C) Oral 70 20 100 % --  12/12/22 1530 100/71 -- -- 72 18 100 % --  12/12/22 1500 (!) 110/35 -- -- 72 15 100 % --  12/12/22 1430 (!) 81/38 -- -- 65 19 99 % --  12/12/22 1400 (!) 78/40 -- -- 66 19 99 % --  12/12/22 1330 (!) 84/44 -- -- 66 19 97 % --  12/12/22 1300 (!) 80/32 -- -- 68 (!) 21 99 % --  12/12/22 1230 -- -- -- 70 14 98 % --  12/12/22 1200 (!) 93/55 98.6 F (37 C) Oral 69 18 99 % --  12/12/22 1130 (!) 89/42 -- -- 70 20 99 % --  12/12/22 1100 (!) 81/41 -- -- 66 19 98 % --  12/12/22 1030 (!) 65/35 -- -- 66 20 98 % --  12/12/22 1000 (!) 72/39 -- -- 69 20 98 % --  12/12/22 0930 (!)  88/41 -- -- 67 19 99 % --  12/12/22 0900 (!) 80/44 -- -- 70 17 100 % --  12/12/22 0830 (!) 80/51 -- -- 71 (!) 25 97 % --  12/12/22 0800 (!) 91/45 98 F (36.7 C) Oral 70 16 99 % --  12/12/22 0730 (!) 84/46 -- -- 70 20 98 % --  12/12/22 0700 100/79 -- -- 71 (!) 21 100 % --  12/12/22 0600 (!) 86/45 -- -- 66 18 100 % --  12/12/22 0500 (!) 93/43 -- -- 70 17 100 % --  12/12/22 0440 -- -- -- -- -- -- 91.3 kg  12/12/22 0400 99/60 98.9 F (37.2 C) -- 72 (!) 21 98 % --  12/12/22 0315 (!) 87/41 -- -- (!) 59 18 100 % --  12/12/22 0300 (!) 80/38 -- -- 67 (!) 24 100 % --  12/12/22 0200 (!) 79/42 -- -- 72 17 100 % --  12/12/22 0100 (!) 73/43 -- -- 78 18 100 % --  12/12/22 0051 -- -- -- 78 17 94 % --  12/12/22 0000 127/87 98.3 F (36.8 C) -- (!) 42 15 (!) 88 % --  12/11/22 2330 101/60 -- -- -- (!) 23 -- --  12/11/22 2315 (!) 85/75 -- -- -- (!) 22 -- --  12/11/22 2300 (!) 89/77 98.4 F (36.9 C) Axillary -- (!) 24 -- --  12/11/22 2250 100/69 -- -- (!) 106 18 100 % --  12/11/22 2245 (!) 51/23 -- -- (!) 131 19 100 % --  12/11/22 2230 (!) 71/48 -- -- 90 (!) 22 97 % --  12/11/22 2215 (!) 66/47 -- -- 91 20 98 % --  12/11/22 2205 (!)  74/54 -- -- 88 20 98 % --  12/11/22 2200 (!) 64/33 -- -- -- (!) 26 -- --  12/11/22 2145 (!) 70/43 -- -- 88 (!) 22 98 % --  12/11/22 2130 (!) 71/50 -- -- 88 (!) 22 100 % --  12/11/22 2115 (!) 81/56 -- -- 84 (!) 22 96 % --  12/11/22 2100 (!) 87/54 -- -- 82 (!) 23 98 % --  12/11/22 2045 (!) 70/45 -- -- 85 (!) 23 (!) 87 % --  12/11/22 2032 (!) 64/29 -- -- 84 (!) 23 93 % --  12/11/22 2030 (!) 51/28 -- -- 83 (!) 26 99 % --  12/11/22 2018 (!) 62/29 -- -- 83 (!) 26 98 % --  12/11/22 2015 (!) 54/25 -- -- 77 15 99 % --  12/11/22 2000 (!) 101/40 -- -- 79 (!) 21 97 % --  12/11/22 1948 (!) 96/45 -- -- 77 (!) 25 97 % --  12/11/22 1945 (!) 62/40 -- -- 77 (!) 22 95 % --  12/11/22 1939 (!) 91/39 98.3 F (36.8 C) Axillary 78 20 95 % --       PHYSICAL EXAM:  General:awake, letahrgic- oriented in person, place . Lungs: b/ll air entry Heart:tachycardia Abd- Peg pannus Rt femoral dialysis cath Extremities: left aka Sacrum stage 2 tears Skin: edema of the skin- stretched and shiny Rectal foley   Lab Results    Latest Ref Rng & Units 12/12/2022    5:12 AM 12/11/2022    5:57 AM 12/10/2022    5:51 AM  CBC  WBC 4.0 - 10.5 K/uL 49.8  36.8  38.8   Hemoglobin 12.0 - 15.0 g/dL 8.8  8.0  7.4   Hematocrit 36.0 - 46.0 % 28.5  25.3  23.1   Platelets 150 -  400 K/uL 280  301  244        Latest Ref Rng & Units 12/12/2022    5:12 AM 12/11/2022    5:57 AM 12/10/2022    5:51 AM  CMP  Glucose 70 - 99 mg/dL 90  952  841   BUN 8 - 23 mg/dL 67  99  89   Creatinine 0.44 - 1.00 mg/dL 3.24  4.01  0.27   Sodium 135 - 145 mmol/L 138  139  135   Potassium 3.5 - 5.1 mmol/L 3.9  2.9  3.5   Chloride 98 - 111 mmol/L 96  105  103   CO2 22 - 32 mmol/L 25  23  20    Calcium 8.9 - 10.3 mg/dL 8.9  8.9  8.6       Microbiology: 8/23 [facility] - MRSA 8/24 Cath tip MRSA 8/25 [facility]- NG so far Urine culture enterococcus and yeast Studies/Results:  Bibasilar infiltrate- ? atelectasis   Assessment/Plan: MRSA bacteremia ( 8/23  and 8/24 culture positive) with septic shock Due to  dialysis catheter and it has been removed Cath tip culture MRSA Repeat blood culture sent on 8/25 2 d echo no veg will need TEE. If tee cannot be done need to empirically treat for 6 weeks on vancomycin Has a new femoral cath   ESRD on dialysis- previous cath was infected and was  removed New dialysis line placed in rt femoral vein- started dialysis  ? 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

## 2022-12-12 NOTE — TOC Progression Note (Signed)
Transition of Care [facility]) - Progression Note    Patient Details  Name: Alexandra Foster MRN: [id number] Date of Birth: [birth year]  Transition of [facility]) CM/SW Contact  Kreg Shropshire, RN Phone Number: 12/12/2022, 11:32 AM  Clinical Narrative:    Continue to follow for toc needs   Expected Discharge Plan: Skilled Nursing Facility Barriers to Discharge: Continued Medical Work up  Expected Discharge Plan and Services     Post Acute Care Choice: Skilled Nursing Facility Living arrangements for the past 2 months: Skilled Nursing Facility                                       Social Determinants of Health (SDOH) Interventions SDOH Screenings   Food Insecurity: Patient Unable To Answer (12/06/2022)  Housing: High Risk (12/06/2022)  Transportation Needs: No Transportation Needs (12/06/2022)  Utilities: Not At Risk (12/06/2022)  Alcohol Screen: Low Risk  (02/11/2021)  Depression (PHQ2-9): Low Risk  (04/30/2021)  Financial Resource Strain: Medium Risk (08/09/2020)  Physical Activity: Inactive (08/09/2020)  Social Connections: Moderately Integrated (08/09/2020)  Stress: No Stress Concern Present (08/09/2020)  Tobacco Use: Medium Risk (12/05/2022)    Readmission Risk Interventions     No data to display

## 2022-12-12 NOTE — Progress Notes (Signed)
[facility]  ROUNDING NOTE   Subjective:   Daughter at bedside.  Hemodialysis treatment yesterday.   UF 400.   Some vomiting at end of treatment.   Wbc 49.8 (36.8)  Objective:  Vital signs in last 24 hours:  Temp:  [98 F (36.7 C)-98.9 F (37.2 C)] 98 F (36.7 C) (08/30 0800) Pulse Rate:  [42-131] 66 (08/30 1100) Resp:  [15-28] 19 (08/30 1100) BP: (51-127)/(23-87) 81/41 (08/30 1100) SpO2:  [87 %-100 %] 98 % (08/30 1100) Weight:  [91.3 kg] 91.3 kg (08/30 0440)  Weight change: 1.3 kg Filed Weights   12/10/22 0500 12/11/22 0420 12/12/22 0440  Weight: 89.5 kg 90 kg 91.3 kg    Intake/Output: I/O last 3 completed shifts: In: 3090 [I.V.:325; NG/GT:2365; IV Piggyback:400] Out: 800 [Urine:300; Emesis/NG output:100; Other:400]   Intake/Output this shift:  Total I/O In: 146 [I.V.:35; Other:110; IV Piggyback:1] Out: 300 [Urine:300]  Physical Exam: General: Critically ill  Head: Normocephalic, atraumatic. Moist oral mucosal membranes  Eyes: Anicteric  Lungs:  Diminished bilaterally   Heart: regular   Abdomen:  Soft, nontender  Extremities:  1+ peripheral edema.  Neurologic: Alert to self and place, not to situation or time   Skin: No lesions  Access:  Right temp HD catheter 8/28.     Basic Metabolic Panel: Recent Labs  Lab 12/08/22 0525 12/08/22 0613 12/09/22 0417 12/09/22 1256 12/09/22 1835 12/10/22 0551 12/11/22 0557 12/12/22 0512  NA 130* 132* 132*  --   --  135 139 138  K 3.6 3.7 2.9* 3.0* 4.2 3.5 2.9* 3.9  CL 100 102 102  --   --  103 105 96*  CO2 21* 20* 20*  --   --  20* 23 25  GLUCOSE 178* 184* 191*  --   --  179* 132* 90  BUN 41* 38* 57*  --   --  89* 99* 67*  CREATININE 2.38* 2.57* 2.88*  --   --  3.31* 3.57* 2.42*  CALCIUM 8.2* 8.2* 8.4*  --   --  8.6* 8.9 8.9  MG 2.3 2.2 2.4  --   --   --  2.2 1.9  PHOS 1.7*  1.6* 1.7* 1.7* 1.1* 1.1* 1.1* 1.7* 2.0*    Liver Function Tests: Recent Labs  Lab 12/05/22 2056 12/06/22 0354  12/08/22 0525 12/09/22 0417 12/10/22 0551 12/11/22 0557 12/12/22 0512  AST 66*  --   --   --   --   --   --   ALT 87*  --   --   --   --   --   --   ALKPHOS 157*  --   --   --   --   --   --   BILITOT 0.6  --   --   --   --   --   --   PROT 8.6*  --   --   --   --   --   --   ALBUMIN 3.3*   < > 2.0* 2.1* 2.2* 2.1* 2.2*   < > = values in this interval not displayed.   No results for input(s): "LIPASE", "AMYLASE" in the last 168 hours. No results for input(s): "AMMONIA" in the last 168 hours.  CBC: Recent Labs  Lab 12/05/22 2056 12/06/22 0404 12/08/22 2595 12/08/22 1656 12/08/22 2217 12/09/22 0417 12/10/22 0551 12/11/22 0557 12/12/22 0512  WBC 21.4*   < > 34.5*  --   --  33.7* 38.8* 36.8* 49.8*  NEUTROABS 19.4*  --   --   --   --   --   --   --   --   HGB 10.2*   < > 6.7*   < > 7.6* 7.4* 7.4* 8.0* 8.8*  HCT 33.4*   < > 21.7*   < > 23.2* 22.7* 23.1* 25.3* 28.5*  MCV 84.3   < > 83.8  --   --  83.5 85.6 86.9 87.4  PLT 411*   < > 256  --   --  213 244 301 280   < > = values in this interval not displayed.    Cardiac Enzymes: No results for input(s): "CKTOTAL", "CKMB", "CKMBINDEX", "TROPONINI" in the last 168 hours.  BNP: Invalid input(s): "POCBNP"  CBG: Recent Labs  Lab 12/11/22 2001 12/12/22 0027 12/12/22 0340 12/12/22 0757 12/12/22 1131  GLUCAP 111* 194* 79 96 130*    Microbiology: Results for orders placed or performed during the hospital encounter of 12/05/22  Culture, blood (Routine x 2)     Status: Abnormal   Collection Time: 12/05/22  8:56 PM   Specimen: BLOOD  Result Value Ref Range Status   Specimen Description   Final    BLOOD BLOOD RIGHT ARM Performed at [facility], [address] [place], [place] [address]    Special Requests   Final    BOTTLES DRAWN AEROBIC AND ANAEROBIC Blood Culture adequate volume Performed at [facility], [address] [place], [place] [address]    Culture  Setup Time   Final    GRAM POSITIVE  COCCI IN BOTH AEROBIC AND ANAEROBIC BOTTLES CRITICAL RESULT CALLED TO, READ BACK BY AND VERIFIED WITH: SHEEMA HALLLAJI AT 1206 12/06/22.PMF Performed at [facility], [address] [place], [place] [address]    Culture (A)  Final    STAPHYLOCOCCUS AUREUS SUSCEPTIBILITIES PERFORMED ON PREVIOUS CULTURE WITHIN THE LAST 5 DAYS. Performed at [facility], [address], [place], [place] [address]    Report Status 12/08/2022 FINAL  Final  Culture, blood (Routine x 2)     Status: Abnormal   Collection Time: 12/05/22  8:57 PM   Specimen: BLOOD  Result Value Ref Range Status   Specimen Description   Final    BLOOD BLOOD LEFT ARM Performed at [facility], [address], [place], [place] [address]    Special Requests   Final    BOTTLES DRAWN AEROBIC AND ANAEROBIC Blood Culture adequate volume Performed at [facility], [address] [place], [place] [address]    Culture  Setup Time   Final    GRAM POSITIVE COCCI IN BOTH AEROBIC AND ANAEROBIC BOTTLES CRITICAL RESULT CALLED TO, READ BACK BY AND VERIFIED WITH: SHEEMA HALLAJI AT 1206 12/06/22.PMF Performed at [facility], [address] [place], [place] [address]    Culture METHICILLIN RESISTANT STAPHYLOCOCCUS AUREUS (A)  Final   Report Status 12/08/2022 FINAL  Final   Organism ID, Bacteria METHICILLIN RESISTANT STAPHYLOCOCCUS AUREUS  Final      Susceptibility   Methicillin resistant staphylococcus aureus - MIC*    CIPROFLOXACIN >=8 RESISTANT Resistant     ERYTHROMYCIN >=8 RESISTANT Resistant     GENTAMICIN <=0.5 SENSITIVE Sensitive     OXACILLIN >=4 RESISTANT Resistant     TETRACYCLINE <=1 SENSITIVE Sensitive     VANCOMYCIN <=0.5 SENSITIVE Sensitive     TRIMETH/SULFA >=320 RESISTANT Resistant     CLINDAMYCIN <=0.25 SENSITIVE Sensitive  RIFAMPIN <=0.5 SENSITIVE Sensitive     Inducible Clindamycin NEGATIVE Sensitive     LINEZOLID 2 SENSITIVE Sensitive     * METHICILLIN RESISTANT  STAPHYLOCOCCUS AUREUS  Blood Culture ID Panel (Reflexed)     Status: Abnormal   Collection Time: 12/05/22  8:57 PM  Result Value Ref Range Status   Enterococcus faecalis NOT DETECTED NOT DETECTED Final   Enterococcus Faecium NOT DETECTED NOT DETECTED Final   Listeria monocytogenes NOT DETECTED NOT DETECTED Final   Staphylococcus species DETECTED (A) NOT DETECTED Final    Comment: CRITICAL RESULT CALLED TO, READ BACK BY AND VERIFIED WITH: SHEEMA HALLAJI AT 1206 12/06/22.PMF    Staphylococcus aureus (BCID) DETECTED (A) NOT DETECTED Final    Comment: Methicillin (oxacillin)-resistant Staphylococcus aureus (MRSA). MRSA is predictably resistant to beta-lactam antibiotics (except ceftaroline). Preferred therapy is vancomycin unless clinically contraindicated. Patient requires contact precautions if  hospitalized. CRITICAL RESULT CALLED TO, READ BACK BY AND VERIFIED WITH: SHEEMA HALLAJI AT 1206 12/06/22.PMF    Staphylococcus epidermidis DETECTED (A) NOT DETECTED Final    Comment: CRITICAL RESULT CALLED TO, READ BACK BY AND VERIFIED WITH: SHEEMA HALLAJI AT 1206 12/06/22.PMF    Staphylococcus lugdunensis NOT DETECTED NOT DETECTED Final   Streptococcus species NOT DETECTED NOT DETECTED Final   Streptococcus agalactiae NOT DETECTED NOT DETECTED Final   Streptococcus pneumoniae NOT DETECTED NOT DETECTED Final   Streptococcus pyogenes NOT DETECTED NOT DETECTED Final   A.calcoaceticus-baumannii NOT DETECTED NOT DETECTED Final   Bacteroides fragilis NOT DETECTED NOT DETECTED Final   Enterobacterales NOT DETECTED NOT DETECTED Final   Enterobacter cloacae complex NOT DETECTED NOT DETECTED Final   Escherichia coli NOT DETECTED NOT DETECTED Final   Klebsiella aerogenes NOT DETECTED NOT DETECTED Final   Klebsiella oxytoca NOT DETECTED NOT DETECTED Final   Klebsiella pneumoniae NOT DETECTED NOT DETECTED Final   Proteus species NOT DETECTED NOT DETECTED Final   Salmonella species NOT DETECTED NOT DETECTED  Final   Serratia marcescens NOT DETECTED NOT DETECTED Final   Haemophilus influenzae NOT DETECTED NOT DETECTED Final   Neisseria meningitidis NOT DETECTED NOT DETECTED Final   Pseudomonas aeruginosa NOT DETECTED NOT DETECTED Final   Stenotrophomonas maltophilia NOT DETECTED NOT DETECTED Final   Candida albicans NOT DETECTED NOT DETECTED Final   Candida auris NOT DETECTED NOT DETECTED Final   Candida glabrata NOT DETECTED NOT DETECTED Final   Candida krusei NOT DETECTED NOT DETECTED Final   Candida parapsilosis NOT DETECTED NOT DETECTED Final   Candida tropicalis NOT DETECTED NOT DETECTED Final   Cryptococcus neoformans/gattii NOT DETECTED NOT DETECTED Final   Methicillin resistance mecA/C DETECTED (A) NOT DETECTED Final    Comment: CRITICAL RESULT CALLED TO, READ BACK BY AND VERIFIED WITH: SHEEMA HALLAJI AT 1206 12/06/22.PMF    Meth resistant mecA/C and MREJ DETECTED (A) NOT DETECTED Final    Comment: CRITICAL RESULT CALLED TO, READ BACK BY AND VERIFIED WITH: SHEEMA HALLAJI AT 1206 12/06/22.PMF Performed at [facility], [address] [place], [place] [address]   Resp panel by RT-PCR (RSV, Flu A&B, Covid) Anterior Nasal Swab     Status: None   Collection Time: 12/05/22  9:53 PM   Specimen: Anterior Nasal Swab  Result Value Ref Range Status   SARS Coronavirus 2 by RT PCR NEGATIVE NEGATIVE Final    Comment: (NOTE) SARS-CoV-2 target nucleic acids are NOT DETECTED.  The SARS-CoV-2 RNA is generally detectable in upper respiratory specimens during the acute phase of infection. The lowest concentration of SARS-CoV-2 viral  copies this assay can detect is 138 copies/mL. A negative result does not preclude SARS-Cov-2 infection and should not be used as the sole basis for treatment or other patient management decisions. A negative result may occur with  improper specimen collection/handling, submission of specimen other than nasopharyngeal swab, presence of viral mutation(s)  within the areas targeted by this assay, and inadequate number of viral copies(<138 copies/mL). A negative result must be combined with clinical observations, patient history, and epidemiological information. The expected result is Negative.  Fact Sheet for Patients:  BloggerCourse.com  Fact Sheet for Healthcare Providers:  SeriousBroker.it  This test is no t yet approved or cleared by the [place] FDA and  has been authorized for detection and/or diagnosis of SARS-CoV-2 by FDA under an Emergency Use Authorization (EUA). This EUA will remain  in effect (meaning this test can be used) for the duration of the COVID-19 declaration under Section 564(b)(1) of the Act, 21 U.S.C.section 360bbb-3(b)(1), unless the authorization is terminated  or revoked sooner.       Influenza A by PCR NEGATIVE NEGATIVE Final   Influenza B by PCR NEGATIVE NEGATIVE Final    Comment: (NOTE) The Xpert Xpress SARS-CoV-2/FLU/RSV plus assay is intended as an aid in the diagnosis of influenza from Nasopharyngeal swab specimens and should not be used as a sole basis for treatment. Nasal washings and aspirates are unacceptable for Xpert Xpress SARS-CoV-2/FLU/RSV testing.  Fact Sheet for Patients: BloggerCourse.com  Fact Sheet for Healthcare Providers: SeriousBroker.it  This test is not yet approved or cleared by the [place] FDA and has been authorized for detection and/or diagnosis of SARS-CoV-2 by FDA under an Emergency Use Authorization (EUA). This EUA will remain in effect (meaning this test can be used) for the duration of the COVID-19 declaration under Section 564(b)(1) of the Act, 21 U.S.C. section 360bbb-3(b)(1), unless the authorization is terminated or revoked.     Resp Syncytial Virus by PCR NEGATIVE NEGATIVE Final    Comment: (NOTE) Fact Sheet for  Patients: BloggerCourse.com  Fact Sheet for Healthcare Providers: SeriousBroker.it  This test is not yet approved or cleared by the [place] FDA and has been authorized for detection and/or diagnosis of SARS-CoV-2 by FDA under an Emergency Use Authorization (EUA). This EUA will remain in effect (meaning this test can be used) for the duration of the COVID-19 declaration under Section 564(b)(1) of the Act, 21 U.S.C. section 360bbb-3(b)(1), unless the authorization is terminated or revoked.  Performed at [facility], [address] [place], [place] [address]   MRSA Next Gen by PCR, Nasal     Status: Abnormal   Collection Time: 12/06/22  4:27 AM   Specimen: Nasal Mucosa; Nasal Swab  Result Value Ref Range Status   MRSA by PCR Next Gen DETECTED (A) NOT DETECTED Final    Comment: RESULT CALLED TO, READ BACK BY AND VERIFIED WITH: KRISTINE CHAMBERS AT 1660 12/06/22.PMF (NOTE) The GeneXpert MRSA Assay (FDA approved for NASAL specimens only), is one component of a comprehensive MRSA colonization surveillance program. It is not intended to diagnose MRSA infection nor to guide or monitor treatment for MRSA infections. Test performance is not FDA approved in patients less than 82 years old. Performed at [facility], [address], [place], [place] [address]   Cath Tip Culture     Status: Abnormal   Collection Time: 12/06/22  2:22 PM   Specimen: Catheter Tip; Other  Result Value Ref Range Status   Specimen Description  Final    CATH TIP Performed at [facility], [address] [place], [place] [address]    Special Requests   Final    NONE Performed at [facility], [address] [place], [place] [address]    Culture (A)  Final    >=100,000 COLONIES/mL METHICILLIN RESISTANT STAPHYLOCOCCUS AUREUS   Report Status 12/08/2022 FINAL  Final   Organism ID, Bacteria METHICILLIN RESISTANT  STAPHYLOCOCCUS AUREUS (A)  Final      Susceptibility   Methicillin resistant staphylococcus aureus - MIC*    CIPROFLOXACIN >=8 RESISTANT Resistant     ERYTHROMYCIN >=8 RESISTANT Resistant     GENTAMICIN <=0.5 SENSITIVE Sensitive     OXACILLIN >=4 RESISTANT Resistant     TETRACYCLINE <=1 SENSITIVE Sensitive     VANCOMYCIN <=0.5 SENSITIVE Sensitive     TRIMETH/SULFA >=320 RESISTANT Resistant     CLINDAMYCIN <=0.25 SENSITIVE Sensitive     RIFAMPIN <=0.5 SENSITIVE Sensitive     Inducible Clindamycin NEGATIVE Sensitive     LINEZOLID 2 SENSITIVE Sensitive     * >=100,000 COLONIES/mL METHICILLIN RESISTANT STAPHYLOCOCCUS AUREUS  Urine Culture (for pregnant, neutropenic or urologic patients or patients with an indwelling urinary catheter)     Status: Abnormal   Collection Time: 12/06/22  5:41 PM   Specimen: Urine, Clean Catch  Result Value Ref Range Status   Specimen Description   Final    URINE, CLEAN CATCH Performed at [facility], [address], [place], [place] [address]    Special Requests   Final    Normal Performed at [facility], [address] [place], [place] [address]    Culture (A)  Final    70,000 COLONIES/mL ENTEROCOCCUS FAECALIS 70,000 COLONIES/mL YEAST    Report Status 12/09/2022 FINAL  Final   Organism ID, Bacteria ENTEROCOCCUS FAECALIS (A)  Final      Susceptibility   Enterococcus faecalis - MIC*    AMPICILLIN <=2 SENSITIVE Sensitive     NITROFURANTOIN <=16 SENSITIVE Sensitive     VANCOMYCIN 1 SENSITIVE Sensitive     * 70,000 COLONIES/mL ENTEROCOCCUS FAECALIS  Culture, blood (Routine X 2) w Reflex to ID Panel     Status: None   Collection Time: 12/07/22  1:31 PM   Specimen: BLOOD  Result Value Ref Range Status   Specimen Description BLOOD BLOOD RIGHT HAND  Final   Special Requests   Final    BOTTLES DRAWN AEROBIC ONLY Blood Culture adequate volume   Culture   Final    NO GROWTH 5 DAYS Performed at [facility], [address] [place], [place] [address]    Report Status 12/12/2022 FINAL  Final  Culture, blood (Routine X 2) w Reflex to ID Panel     Status: None   Collection Time: 12/07/22  6:41 PM   Specimen: BLOOD  Result Value Ref Range Status   Specimen Description BLOOD BLOOD RIGHT HAND  Final   Special Requests   Final    BOTTLES DRAWN AEROBIC AND ANAEROBIC Blood Culture results may not be optimal due to an inadequate volume of blood received in culture bottles   Culture   Final    NO GROWTH 5 DAYS Performed at [facility], [address] [place], [place] [address]    Report Status 12/12/2022 FINAL  Final    Coagulation Studies: No results for input(s): "LABPROT", "INR" in the last 72 hours.   Urinalysis: No results for input(s): "COLORURINE", "LABSPEC", "PHURINE", "GLUCOSEU", "HGBUR", "  BILIRUBINUR", "KETONESUR", "PROTEINUR", "UROBILINOGEN", "NITRITE", "LEUKOCYTESUR" in the last 72 hours.  Invalid input(s): "APPERANCEUR"     Imaging: DG Abd 1 View  Result Date: 12/12/2022 CLINICAL DATA:  Vomiting. EXAM: ABDOMEN - 1 VIEW COMPARISON:  12/09/2022 and CT 12/10/2022 FINDINGS: Right femoral dialysis catheter with the tip in the lower IVC region. Patient has an IVC filter above the dialysis catheter tip. Patient is rotated on these images. Nonobstructive bowel gas pattern in the abdomen and pelvis. Round high-density foci in the pelvis most compatible with calcifications and colonic diverticula based on previous CT. Patient has known right renal calculi that are poorly characterized on this examination. Gastrostomy tube is present. IMPRESSION: 1. Nonobstructive bowel gas pattern. 2. Right femoral dialysis catheter. 3. Gastrostomy tube. 4. Right renal calculi. Electronically Signed   By: Richarda Overlie M.D.   On: 12/12/2022 08:00   DG Chest Port 1 View  Result Date: 12/12/2022 CLINICAL DATA:  Altered mental status.  Vomiting. EXAM: PORTABLE CHEST 1 VIEW COMPARISON:  12/05/2022 and chest CT  12/02/2022 FINDINGS: Heart and mediastinum are within normal limits. Patchy interstitial densities in both lungs are nonspecific. No large areas airspace disease or consolidation. Negative for a pneumothorax. No acute bone abnormality. IMPRESSION: Patchy interstitial densities in both lungs. Findings are nonspecific and could represent atelectasis or edema. No large areas of airspace disease. Electronically Signed   By: Richarda Overlie M.D.   On: 12/12/2022 07:57     Medications:    sodium chloride Stopped (12/07/22 0210)   sodium chloride Stopped (12/08/22 1823)   ampicillin-sulbactam (UNASYN) IV 3 g (12/12/22 1116)   anticoagulant sodium citrate     copper chloride 2 mg in dextrose 5 % 100 mL IVPB 50 mL/hr at 12/12/22 1045   norepinephrine (LEVOPHED) Adult infusion 4 mcg/min (12/12/22 1045)    amiodarone  200 mg Per Tube BID   ascorbic acid  500 mg Per Tube BID   atorvastatin  40 mg Per Tube Daily   chlorhexidine  15 mL Mouth Rinse BID   Chlorhexidine Gluconate Cloth  6 each Topical Q0600   vitamin D3  2,000 Units Per Tube Daily   [START ON 12/18/2022] copper  2 mg Per Tube Daily   droxidopa  200 mg Per Tube TID WC   escitalopram  10 mg Per Tube Daily   famotidine  10 mg Per Tube Daily   feeding supplement (NEPRO CARB STEADY)  1,000 mL Per Tube Q24H   feeding supplement (PROSource TF20)  60 mL Per Tube Daily   free water  30 mL Per Tube Q4H   Gerhardt's butt cream   Topical BID   heparin injection (subcutaneous)  5,000 Units Subcutaneous Q8H   hydrOXYzine  50 mg Per Tube Q6H   insulin aspart  0-6 Units Subcutaneous Q4H   midodrine  15 mg Per Tube TID WC   multivitamin  1 tablet Per Tube QHS   mupirocin ointment   Nasal BID   nutrition supplement (JUVEN)  1 packet Per Tube BID BM   mouth rinse  15 mL Mouth Rinse 4 times per day   potassium & sodium phosphates  2 packet Per Tube BID   vancomycin variable dose per unstable renal function (pharmacist dosing)   Does not apply See admin  instructions   sodium chloride, acetaminophen **OR** acetaminophen, alteplase, alteplase, anticoagulant sodium citrate, artificial tears, clonazePAM, feeding supplement (NEPRO CARB STEADY), fentaNYL (SUBLIMAZE) injection, heparin, heparin, ipratropium-albuterol, lidocaine (PF), lidocaine-prilocaine, loperamide HCl, magnesium hydroxide, ondansetron **OR** ondansetron (  ZOFRAN) IV, mouth rinse, pentafluoroprop-tetrafluoroeth, sennosides, traZODone  Assessment/ Plan:  [name] is a 72 y.o.  female with past medical history of diabetes, COPD, GERD, hypertension. OA, a fib, and end stage renal disease on hemodialysis. Patient presents to the ED from her nursing facility complaining of altered mental status. She has been admitted for Lower urinary tract infectious disease [N39.0] Altered mental status, unspecified altered mental status type [R41.82] Sepsis due to gram-negative UTI (HCC) [A41.50, N39.0] Sepsis, due to unspecified organism, unspecified whether acute organ dysfunction present (HCC) [A41.9]  CK [facility]/TTS/Rt Permcath/81.3kg  End stage renal disease - Will have temp HD catheter placed today and attempt intermittent hemodialysis. Hesitant to keep catheter in with leukocytosis.  - discussed case with critical care team.   2. Hypotension due to sepsis. Blood cultures and cath tip cultures with MRSA. Repeat cultures with no growth. However now with worsening leukocytosis.   - requiring vasopressors: norepinephrine.   - continue antibiotics: vancomycin. - Appreciate ID input.   3. Anemia of chronic kidney disease : PRBC transfusion on 8/26. Hemoglobin 8.8  - EPO with HD treatments  4. Secondary Hyperparathyroidism:  with hypophosphatemia.   - continue to replace phosphorus.   - holding binders.      LOS: 6 Ott Zimmerle 8/30/202412:05 PM

## 2022-12-13 DIAGNOSIS — R6521 Severe sepsis with septic shock: Secondary | ICD-10-CM | POA: Diagnosis not present

## 2022-12-13 DIAGNOSIS — A419 Sepsis, unspecified organism: Secondary | ICD-10-CM | POA: Diagnosis not present

## 2022-12-13 LAB — CBC
HCT: 25.5 % — ABNORMAL LOW (ref 36.0–46.0)
Hemoglobin: 7.9 g/dL — ABNORMAL LOW (ref 12.0–15.0)
MCH: 27.9 pg (ref 26.0–34.0)
MCHC: 31 g/dL (ref 30.0–36.0)
MCV: 90.1 fL (ref 80.0–100.0)
Platelets: 351 10*3/uL (ref 150–400)
RBC: 2.83 MIL/uL — ABNORMAL LOW (ref 3.87–5.11)
RDW: 21.2 % — ABNORMAL HIGH (ref 11.5–15.5)
WBC: 37.5 10*3/uL — ABNORMAL HIGH (ref 4.0–10.5)
nRBC: 12.9 % — ABNORMAL HIGH (ref 0.0–0.2)

## 2022-12-13 LAB — RENAL FUNCTION PANEL
Albumin: 2.1 g/dL — ABNORMAL LOW (ref 3.5–5.0)
Anion gap: 12 (ref 5–15)
BUN: 90 mg/dL — ABNORMAL HIGH (ref 8–23)
CO2: 24 mmol/L (ref 22–32)
Calcium: 8.4 mg/dL — ABNORMAL LOW (ref 8.9–10.3)
Chloride: 96 mmol/L — ABNORMAL LOW (ref 98–111)
Creatinine, Ser: 3.09 mg/dL — ABNORMAL HIGH (ref 0.44–1.00)
GFR, Estimated: 15 mL/min — ABNORMAL LOW (ref >60)
Glucose, Bld: 172 mg/dL — ABNORMAL HIGH (ref 70–99)
Phosphorus: 3.6 mg/dL (ref 2.5–4.6)
Potassium: 3.4 mmol/L — ABNORMAL LOW (ref 3.5–5.1)
Sodium: 132 mmol/L — ABNORMAL LOW (ref 135–145)

## 2022-12-13 LAB — GASTROINTESTINAL PANEL BY PCR, STOOL (REPLACES STOOL CULTURE)

## 2022-12-13 LAB — GLUCOSE, CAPILLARY
Glucose-Capillary: 126 mg/dL — ABNORMAL HIGH (ref 70–99)
Glucose-Capillary: 142 mg/dL — ABNORMAL HIGH (ref 70–99)
Glucose-Capillary: 153 mg/dL — ABNORMAL HIGH (ref 70–99)
Glucose-Capillary: 155 mg/dL — ABNORMAL HIGH (ref 70–99)
Glucose-Capillary: 157 mg/dL — ABNORMAL HIGH (ref 70–99)
Glucose-Capillary: 180 mg/dL — ABNORMAL HIGH (ref 70–99)

## 2022-12-13 LAB — TSH: TSH: 2.198 u[IU]/mL (ref 0.350–4.500)

## 2022-12-13 LAB — HEPATITIS B SURFACE ANTIBODY, QUANTITATIVE: Hep B S AB Quant (Post): <3.5 m[IU]/mL — ABNORMAL LOW

## 2022-12-13 MED ORDER — VANCOMYCIN HCL IN DEXTROSE 1-5 GM/200ML-% IV SOLN
1000.0000 mg | Freq: Once | INTRAVENOUS | Status: AC
  Administered 2022-12-13: 1000 mg via INTRAVENOUS
  Filled 2022-12-13: qty 200

## 2022-12-13 MED ORDER — POTASSIUM CHLORIDE 20 MEQ PO PACK
40.0000 meq | PACK | Freq: Once | ORAL | Status: AC
  Administered 2022-12-13: 40 meq
  Filled 2022-12-13: qty 2

## 2022-12-13 NOTE — Plan of Care (Signed)
Discussed with patient and family plan of care for the evening, pain management and medications with some teach back displayed.  What is important to patient is rest and limited movement.  Problem: Education: Goal: Knowledge of General Education information will improve Description: Including pain rating scale, medication(s)/side effects and non-pharmacologic comfort measures Outcome: Progressing   Problem: Health Behavior/Discharge Planning: Goal: Ability to manage health-related needs will improve Outcome: Progressing

## 2022-12-13 NOTE — Plan of Care (Signed)
Discussed with patient and family plan of care for the evening, pain management and skin care with some teach back displayed.  What is important to the patient is rest and pain control.  Patient and family aware dialysis will take place tonight.  Problem: Education: Goal: Knowledge of General Education information will improve Description: Including pain rating scale, medication(s)/side effects and non-pharmacologic comfort measures Outcome: Progressing   Problem: Health Behavior/Discharge Planning: Goal: Ability to manage health-related needs will improve Outcome: Progressing   Problem: Pain Managment: Goal: General experience of comfort will improve Outcome: Not Progressing   Problem: Skin Integrity: Goal: Risk for impaired skin integrity will decrease Outcome: Progressing

## 2022-12-13 NOTE — Progress Notes (Signed)
[facility]  ROUNDING NOTE   Subjective:   Patient remains critically ill.  Family member at bedside. Still requiring Levophed. More alert today, able to answer simple questions. Now has a right femoral temp cath.  Objective:  Vital signs in last 24 hours:  Temp:  [98.1 F (36.7 C)-98.7 F (37.1 C)] 98.7 F (37.1 C) (08/31 0748) Pulse Rate:  [65-83] 73 (08/31 0715) Resp:  [14-28] 19 (08/31 0715) BP: (65-125)/(32-82) 101/51 (08/31 0715) SpO2:  [96 %-100 %] 96 % (08/31 0715) Weight:  [90.5 kg] 90.5 kg (08/31 0445)  Weight change: -0.8 kg Filed Weights   12/11/22 0420 12/12/22 0440 12/13/22 0445  Weight: 90 kg 91.3 kg 90.5 kg    Intake/Output: I/O last 3 completed shifts: In: 3455.4 [I.V.:826.3; Other:210; NG/GT:1819.1; IV Piggyback:600] Out: 1480 [Urine:1000; Other:400; Stool:80]   Intake/Output this shift:  No intake/output data recorded.  Physical Exam: General: Critically ill  Head: Normocephalic, atraumatic. Moist oral mucosal membranes  Eyes: Anicteric  Lungs:  Diminished bilaterally   Heart: regular   Abdomen:  Soft, nontender  Extremities:  1+ peripheral edema.  Neurologic: Alert to self and place, not to situation or time   Skin: No lesions  Access:  Right temp HD catheter 8/28.     Basic Metabolic Panel: Recent Labs  Lab 12/08/22 0525 12/08/22 1610 12/09/22 0417 12/09/22 1256 12/09/22 1835 12/10/22 0551 12/11/22 0557 12/12/22 0512 12/13/22 0428  NA 130* 132* 132*  --   --  135 139 138 132*  K 3.6 3.7 2.9*   < > 4.2 3.5 2.9* 3.9 3.4*  CL 100 102 102  --   --  103 105 96* 96*  CO2 21* 20* 20*  --   --  20* 23 25 24   GLUCOSE 178* 184* 191*  --   --  179* 132* 90 172*  BUN 41* 38* 57*  --   --  89* 99* 67* 90*  CREATININE 2.38* 2.57* 2.88*  --   --  3.31* 3.57* 2.42* 3.09*  CALCIUM 8.2* 8.2* 8.4*  --   --  8.6* 8.9 8.9 8.4*  MG 2.3 2.2 2.4  --   --   --  2.2 1.9  --   PHOS 1.7*  1.6* 1.7* 1.7*   < > 1.1* 1.1* 1.7* 2.0* 3.6   < >  = values in this interval not displayed.    Liver Function Tests: Recent Labs  Lab 12/09/22 0417 12/10/22 0551 12/11/22 0557 12/12/22 0512 12/13/22 0428  ALBUMIN 2.1* 2.2* 2.1* 2.2* 2.1*   No results for input(s): "LIPASE", "AMYLASE" in the last 168 hours. No results for input(s): "AMMONIA" in the last 168 hours.  CBC: Recent Labs  Lab 12/09/22 0417 12/10/22 0551 12/11/22 0557 12/12/22 0512 12/13/22 0428  WBC 33.7* 38.8* 36.8* 49.8* 37.5*  HGB 7.4* 7.4* 8.0* 8.8* 7.9*  HCT 22.7* 23.1* 25.3* 28.5* 25.5*  MCV 83.5 85.6 86.9 87.4 90.1  PLT 213 244 301 280 351    Cardiac Enzymes: No results for input(s): "CKTOTAL", "CKMB", "CKMBINDEX", "TROPONINI" in the last 168 hours.  BNP: Invalid input(s): "POCBNP"  CBG: Recent Labs  Lab 12/12/22 1542 12/12/22 1937 12/12/22 2348 12/13/22 0429 12/13/22 0746  GLUCAP 140* 158* 152* 153* 180*    Microbiology: Results for orders placed or performed during the hospital encounter of 12/05/22  Culture, blood (Routine x 2)     Status: Abnormal   Collection Time: 12/05/22  8:56 PM   Specimen: BLOOD  Result Value  Ref Range Status   Specimen Description   Final    BLOOD BLOOD RIGHT ARM Performed at [facility], [address] [place], [place] [address]    Special Requests   Final    BOTTLES DRAWN AEROBIC AND ANAEROBIC Blood Culture adequate volume Performed at [facility] [address] [place], [place] [address]    Culture  Setup Time   Final    GRAM POSITIVE COCCI IN BOTH AEROBIC AND ANAEROBIC BOTTLES CRITICAL RESULT CALLED TO, READ BACK BY AND VERIFIED WITH: SHEEMA HALLLAJI AT 1206 12/06/22.PMF Performed at [facility], [address] [place], [place] [address]    Culture (A)  Final    STAPHYLOCOCCUS AUREUS SUSCEPTIBILITIES PERFORMED ON PREVIOUS CULTURE WITHIN THE LAST 5 DAYS. Performed at [facility], [address] [place], [place] [address]    Report Status 12/08/2022 FINAL   Final  Culture, blood (Routine x 2)     Status: Abnormal   Collection Time: 12/05/22  8:57 PM   Specimen: BLOOD  Result Value Ref Range Status   Specimen Description   Final    BLOOD BLOOD LEFT ARM Performed at [facility], [address], [place], [place] [address]    Special Requests   Final    BOTTLES DRAWN AEROBIC AND ANAEROBIC Blood Culture adequate volume Performed at [facility], [address] [place], [place] [address]    Culture  Setup Time   Final    GRAM POSITIVE COCCI IN BOTH AEROBIC AND ANAEROBIC BOTTLES CRITICAL RESULT CALLED TO, READ BACK BY AND VERIFIED WITH: SHEEMA HALLAJI AT 1206 12/06/22.PMF Performed at [facility], [address] [place], [place] [address]    Culture METHICILLIN RESISTANT STAPHYLOCOCCUS AUREUS (A)  Final   Report Status 12/08/2022 FINAL  Final   Organism ID, Bacteria METHICILLIN RESISTANT STAPHYLOCOCCUS AUREUS  Final      Susceptibility   Methicillin resistant staphylococcus aureus - MIC*    CIPROFLOXACIN >=8 RESISTANT Resistant     ERYTHROMYCIN >=8 RESISTANT Resistant     GENTAMICIN <=0.5 SENSITIVE Sensitive     OXACILLIN >=4 RESISTANT Resistant     TETRACYCLINE <=1 SENSITIVE Sensitive     VANCOMYCIN <=0.5 SENSITIVE Sensitive     TRIMETH/SULFA >=320 RESISTANT Resistant     CLINDAMYCIN <=0.25 SENSITIVE Sensitive     RIFAMPIN <=0.5 SENSITIVE Sensitive     Inducible Clindamycin NEGATIVE Sensitive     LINEZOLID 2 SENSITIVE Sensitive     * METHICILLIN RESISTANT STAPHYLOCOCCUS AUREUS  Blood Culture ID Panel (Reflexed)     Status: Abnormal   Collection Time: 12/05/22  8:57 PM  Result Value Ref Range Status   Enterococcus faecalis NOT DETECTED NOT DETECTED Final   Enterococcus Faecium NOT DETECTED NOT DETECTED Final   Listeria monocytogenes NOT DETECTED NOT DETECTED Final   Staphylococcus species DETECTED (A) NOT DETECTED Final    Comment: CRITICAL RESULT CALLED TO, READ BACK BY AND VERIFIED WITH: SHEEMA  HALLAJI AT 1206 12/06/22.PMF    Staphylococcus aureus (BCID) DETECTED (A) NOT DETECTED Final    Comment: Methicillin (oxacillin)-resistant Staphylococcus aureus (MRSA). MRSA is predictably resistant to beta-lactam antibiotics (except ceftaroline). Preferred therapy is vancomycin unless clinically contraindicated. Patient requires contact precautions if  hospitalized. CRITICAL RESULT CALLED TO, READ BACK BY AND VERIFIED WITH: SHEEMA HALLAJI AT 1206 12/06/22.PMF    Staphylococcus epidermidis DETECTED (A) NOT DETECTED Final    Comment: CRITICAL RESULT CALLED TO, READ BACK BY AND VERIFIED WITH: SHEEMA HALLAJI AT  1206 12/06/22.PMF    Staphylococcus lugdunensis NOT DETECTED NOT DETECTED Final   Streptococcus species NOT DETECTED NOT DETECTED Final   Streptococcus agalactiae NOT DETECTED NOT DETECTED Final   Streptococcus pneumoniae NOT DETECTED NOT DETECTED Final   Streptococcus pyogenes NOT DETECTED NOT DETECTED Final   A.calcoaceticus-baumannii NOT DETECTED NOT DETECTED Final   Bacteroides fragilis NOT DETECTED NOT DETECTED Final   Enterobacterales NOT DETECTED NOT DETECTED Final   Enterobacter cloacae complex NOT DETECTED NOT DETECTED Final   Escherichia coli NOT DETECTED NOT DETECTED Final   Klebsiella aerogenes NOT DETECTED NOT DETECTED Final   Klebsiella oxytoca NOT DETECTED NOT DETECTED Final   Klebsiella pneumoniae NOT DETECTED NOT DETECTED Final   Proteus species NOT DETECTED NOT DETECTED Final   Salmonella species NOT DETECTED NOT DETECTED Final   Serratia marcescens NOT DETECTED NOT DETECTED Final   Haemophilus influenzae NOT DETECTED NOT DETECTED Final   Neisseria meningitidis NOT DETECTED NOT DETECTED Final   Pseudomonas aeruginosa NOT DETECTED NOT DETECTED Final   Stenotrophomonas maltophilia NOT DETECTED NOT DETECTED Final   Candida albicans NOT DETECTED NOT DETECTED Final   Candida auris NOT DETECTED NOT DETECTED Final   Candida glabrata NOT DETECTED NOT DETECTED Final    Candida krusei NOT DETECTED NOT DETECTED Final   Candida parapsilosis NOT DETECTED NOT DETECTED Final   Candida tropicalis NOT DETECTED NOT DETECTED Final   Cryptococcus neoformans/gattii NOT DETECTED NOT DETECTED Final   Methicillin resistance mecA/C DETECTED (A) NOT DETECTED Final    Comment: CRITICAL RESULT CALLED TO, READ BACK BY AND VERIFIED WITH: SHEEMA HALLAJI AT 1206 12/06/22.PMF    Meth resistant mecA/C and MREJ DETECTED (A) NOT DETECTED Final    Comment: CRITICAL RESULT CALLED TO, READ BACK BY AND VERIFIED WITH: SHEEMA HALLAJI AT 1206 12/06/22.PMF Performed at [facility], [address] [place], [place] [address]   Resp panel by RT-PCR (RSV, Flu A&B, Covid) Anterior Nasal Swab     Status: None   Collection Time: 12/05/22  9:53 PM   Specimen: Anterior Nasal Swab  Result Value Ref Range Status   SARS Coronavirus 2 by RT PCR NEGATIVE NEGATIVE Final    Comment: (NOTE) SARS-CoV-2 target nucleic acids are NOT DETECTED.  The SARS-CoV-2 RNA is generally detectable in upper respiratory specimens during the acute phase of infection. The lowest concentration of SARS-CoV-2 viral copies this assay can detect is 138 copies/mL. A negative result does not preclude SARS-Cov-2 infection and should not be used as the sole basis for treatment or other patient management decisions. A negative result may occur with  improper specimen collection/handling, submission of specimen other than nasopharyngeal swab, presence of viral mutation(s) within the areas targeted by this assay, and inadequate number of viral copies(<138 copies/mL). A negative result must be combined with clinical observations, patient history, and epidemiological information. The expected result is Negative.  Fact Sheet for Patients:  BloggerCourse.com  Fact Sheet for Healthcare Providers:  SeriousBroker.it  This test is no t yet approved or cleared by the [place] FDA and  has been authorized for detection and/or diagnosis of SARS-CoV-2 by FDA under an Emergency Use Authorization (EUA). This EUA will remain  in effect (meaning this test can be used) for the duration of the COVID-19 declaration under Section 564(b)(1) of the Act, 21 U.S.C.section 360bbb-3(b)(1), unless the authorization is terminated  or revoked sooner.       Influenza A by PCR NEGATIVE NEGATIVE Final   Influenza B by PCR NEGATIVE NEGATIVE Final  Comment: (NOTE) The Xpert Xpress SARS-CoV-2/FLU/RSV plus assay is intended as an aid in the diagnosis of influenza from Nasopharyngeal swab specimens and should not be used as a sole basis for treatment. Nasal washings and aspirates are unacceptable for Xpert Xpress SARS-CoV-2/FLU/RSV testing.  Fact Sheet for Patients: BloggerCourse.com  Fact Sheet for Healthcare Providers: SeriousBroker.it  This test is not yet approved or cleared by the [place] FDA and has been authorized for detection and/or diagnosis of SARS-CoV-2 by FDA under an Emergency Use Authorization (EUA). This EUA will remain in effect (meaning this test can be used) for the duration of the COVID-19 declaration under Section 564(b)(1) of the Act, 21 U.S.C. section 360bbb-3(b)(1), unless the authorization is terminated or revoked.     Resp Syncytial Virus by PCR NEGATIVE NEGATIVE Final    Comment: (NOTE) Fact Sheet for Patients: BloggerCourse.com  Fact Sheet for Healthcare Providers: SeriousBroker.it  This test is not yet approved or cleared by the [place] FDA and has been authorized for detection and/or diagnosis of SARS-CoV-2 by FDA under an Emergency Use Authorization (EUA). This EUA will remain in effect (meaning this test can be used) for the duration of the COVID-19 declaration under Section 564(b)(1) of the Act, 21 U.S.C. section  360bbb-3(b)(1), unless the authorization is terminated or revoked.  Performed at [facility], [address] [place], [place] [address]   MRSA Next Gen by PCR, Nasal     Status: Abnormal   Collection Time: 12/06/22  4:27 AM   Specimen: Nasal Mucosa; Nasal Swab  Result Value Ref Range Status   MRSA by PCR Next Gen DETECTED (A) NOT DETECTED Final    Comment: RESULT CALLED TO, READ BACK BY AND VERIFIED WITH: KRISTINE CHAMBERS AT 4696 12/06/22.PMF (NOTE) The GeneXpert MRSA Assay (FDA approved for NASAL specimens only), is one component of a comprehensive MRSA colonization surveillance program. It is not intended to diagnose MRSA infection nor to guide or monitor treatment for MRSA infections. Test performance is not FDA approved in patients less than 46 years old. Performed at [facility], [address], [place], [place] [address]   Cath Tip Culture     Status: Abnormal   Collection Time: 12/06/22  2:22 PM   Specimen: Catheter Tip; Other  Result Value Ref Range Status   Specimen Description   Final    CATH TIP Performed at [facility], [address] [place], [place] [address]    Special Requests   Final    NONE Performed at [facility], [address] [place], [place] [address]    Culture (A)  Final    >=100,000 COLONIES/mL METHICILLIN RESISTANT STAPHYLOCOCCUS AUREUS   Report Status 12/08/2022 FINAL  Final   Organism ID, Bacteria METHICILLIN RESISTANT STAPHYLOCOCCUS AUREUS (A)  Final      Susceptibility   Methicillin resistant staphylococcus aureus - MIC*    CIPROFLOXACIN >=8 RESISTANT Resistant     ERYTHROMYCIN >=8 RESISTANT Resistant     GENTAMICIN <=0.5 SENSITIVE Sensitive     OXACILLIN >=4 RESISTANT Resistant     TETRACYCLINE <=1 SENSITIVE Sensitive     VANCOMYCIN <=0.5 SENSITIVE Sensitive     TRIMETH/SULFA >=320 RESISTANT Resistant     CLINDAMYCIN <=0.25 SENSITIVE Sensitive     RIFAMPIN <=0.5 SENSITIVE Sensitive      Inducible Clindamycin NEGATIVE Sensitive     LINEZOLID 2 SENSITIVE Sensitive     * >=100,000 COLONIES/mL METHICILLIN RESISTANT STAPHYLOCOCCUS AUREUS  Urine Culture (for pregnant, neutropenic or urologic  patients or patients with an indwelling urinary catheter)     Status: Abnormal   Collection Time: 12/06/22  5:41 PM   Specimen: Urine, Clean Catch  Result Value Ref Range Status   Specimen Description   Final    URINE, CLEAN CATCH Performed at [facility], [address], [place], [place] [address]    Special Requests   Final    Normal Performed at [facility], [address] [place], [place] [address]    Culture (A)  Final    70,000 COLONIES/mL ENTEROCOCCUS FAECALIS 70,000 COLONIES/mL YEAST    Report Status 12/09/2022 FINAL  Final   Organism ID, Bacteria ENTEROCOCCUS FAECALIS (A)  Final      Susceptibility   Enterococcus faecalis - MIC*    AMPICILLIN <=2 SENSITIVE Sensitive     NITROFURANTOIN <=16 SENSITIVE Sensitive     VANCOMYCIN 1 SENSITIVE Sensitive     * 70,000 COLONIES/mL ENTEROCOCCUS FAECALIS  Culture, blood (Routine X 2) w Reflex to ID Panel     Status: None   Collection Time: 12/07/22  1:31 PM   Specimen: BLOOD  Result Value Ref Range Status   Specimen Description BLOOD BLOOD RIGHT HAND  Final   Special Requests   Final    BOTTLES DRAWN AEROBIC ONLY Blood Culture adequate volume   Culture   Final    NO GROWTH 5 DAYS Performed at [facility], [address] [place], [place] [address]    Report Status 12/12/2022 FINAL  Final  Culture, blood (Routine X 2) w Reflex to ID Panel     Status: None   Collection Time: 12/07/22  6:41 PM   Specimen: BLOOD  Result Value Ref Range Status   Specimen Description BLOOD BLOOD RIGHT HAND  Final   Special Requests   Final    BOTTLES DRAWN AEROBIC AND ANAEROBIC Blood Culture results may not be optimal due to an inadequate volume of blood received in culture bottles   Culture   Final    NO GROWTH 5  DAYS Performed at [facility], [address] [place], [place] [address]    Report Status 12/12/2022 FINAL  Final  C Difficile Quick Screen (NO PCR Reflex)     Status: None   Collection Time: 12/12/22  4:07 PM   Specimen: STOOL  Result Value Ref Range Status   C Diff antigen NEGATIVE NEGATIVE Final   C Diff toxin NEGATIVE NEGATIVE Final   C Diff interpretation No C. difficile detected.  Final    Comment: Performed at [facility], [address] [place], [place] [address]    Coagulation Studies: No results for input(s): "LABPROT", "INR" in the last 72 hours.   Urinalysis: No results for input(s): "COLORURINE", "LABSPEC", "PHURINE", "GLUCOSEU", "HGBUR", "BILIRUBINUR", "KETONESUR", "PROTEINUR", "UROBILINOGEN", "NITRITE", "LEUKOCYTESUR" in the last 72 hours.  Invalid input(s): "APPERANCEUR"     Imaging: DG Abd 1 View  Result Date: 12/12/2022 CLINICAL DATA:  Vomiting. EXAM: ABDOMEN - 1 VIEW COMPARISON:  12/09/2022 and CT 12/10/2022 FINDINGS: Right femoral dialysis catheter with the tip in the lower IVC region. Patient has an IVC filter above the dialysis catheter tip. Patient is rotated on these images. Nonobstructive bowel gas pattern in the abdomen and pelvis. Round high-density foci in the pelvis most compatible with calcifications and colonic diverticula based on previous CT. Patient has known right renal calculi that are poorly characterized on this examination. Gastrostomy tube is present. IMPRESSION: 1. Nonobstructive bowel gas pattern. 2. Right femoral  dialysis catheter. 3. Gastrostomy tube. 4. Right renal calculi. Electronically Signed   By: Richarda Overlie M.D.   On: 12/12/2022 08:00   DG Chest Port 1 View  Result Date: 12/12/2022 CLINICAL DATA:  Altered mental status.  Vomiting. EXAM: PORTABLE CHEST 1 VIEW COMPARISON:  12/05/2022 and chest CT 12/02/2022 FINDINGS: Heart and mediastinum are within normal limits. Patchy interstitial densities in both lungs are  nonspecific. No large areas airspace disease or consolidation. Negative for a pneumothorax. No acute bone abnormality. IMPRESSION: Patchy interstitial densities in both lungs. Findings are nonspecific and could represent atelectasis or edema. No large areas of airspace disease. Electronically Signed   By: Richarda Overlie M.D.   On: 12/12/2022 07:57     Medications:    sodium chloride Stopped (12/07/22 0210)   sodium chloride Stopped (12/08/22 1823)   ampicillin-sulbactam (UNASYN) IV Stopped (12/13/22 0103)   anticoagulant sodium citrate     copper chloride 2 mg in dextrose 5 % 100 mL IVPB Stopped (12/12/22 1243)   norepinephrine (LEVOPHED) Adult infusion 9 mcg/min (12/13/22 0534)    amiodarone  200 mg Per Tube BID   ascorbic acid  500 mg Per Tube BID   atorvastatin  40 mg Per Tube Daily   chlorhexidine  15 mL Mouth Rinse BID   Chlorhexidine Gluconate Cloth  6 each Topical Q0600   vitamin D3  2,000 Units Per Tube Daily   [START ON 12/18/2022] copper  2 mg Per Tube Daily   droxidopa  200 mg Per Tube TID WC   epoetin (EPOGEN/PROCRIT) injection  10,000 Units Intravenous Q T,Th,Sa-HD   escitalopram  10 mg Per Tube Daily   famotidine  10 mg Per Tube Daily   feeding supplement (NEPRO CARB STEADY)  1,000 mL Per Tube Q24H   feeding supplement (PROSource TF20)  60 mL Per Tube Daily   free water  30 mL Per Tube Q4H   Gerhardt's butt cream   Topical BID   heparin injection (subcutaneous)  5,000 Units Subcutaneous Q8H   hydrOXYzine  50 mg Per Tube Q6H   insulin aspart  0-6 Units Subcutaneous Q4H   midodrine  15 mg Per Tube TID WC   multivitamin  1 tablet Per Tube QHS   mupirocin ointment   Nasal BID   nutrition supplement (JUVEN)  1 packet Per Tube BID BM   mouth rinse  15 mL Mouth Rinse 4 times per day   vancomycin variable dose per unstable renal function (pharmacist dosing)   Does not apply See admin instructions   sodium chloride, acetaminophen **OR** acetaminophen, alteplase, alteplase,  anticoagulant sodium citrate, artificial tears, clonazePAM, fentaNYL (SUBLIMAZE) injection, heparin, heparin, ipratropium-albuterol, lidocaine (PF), lidocaine-prilocaine, loperamide HCl, magnesium hydroxide, ondansetron **OR** ondansetron (ZOFRAN) IV, mouth rinse, pentafluoroprop-tetrafluoroeth, sennosides, traZODone  Assessment/ Plan:  [name] is a 72 y.o.  female with past medical history of diabetes, COPD, GERD, hypertension. OA, a fib, and end stage renal disease on hemodialysis. Patient presents to the ED from her nursing facility complaining of altered mental status. She has been admitted for Lower urinary tract infectious disease [N39.0] Altered mental status, unspecified altered mental status type [R41.82] Sepsis due to gram-negative UTI (HCC) [A41.50, N39.0] Sepsis, due to unspecified organism, unspecified whether acute organ dysfunction present (HCC) [A41.9]  [facility]/TTS/Rt Permcath/81.3kg  End stage renal disease -Hemodialysis via femoral temp cath TTS schedule.   2. Hypotension due to sepsis. Blood cultures and dialysis cath tip cultures with MRSA. Repeat cultures with no growth.  However now with worsening leukocytosis.   - requiring vasopressors: norepinephrine.   - continue antibiotics: vancomycin. - Appreciate ID input.   3. Anemia of chronic kidney disease : PRBC transfusion on 8/26. Hemoglobin 7.9  - EPO with HD treatments  4. Secondary Hyperparathyroidism:  with hypophosphatemia.   - continue to replace phosphorus.   - holding binders.      LOS: 7 Prabhjot Maddux 8/31/20248:16 AM

## 2022-12-13 NOTE — Progress Notes (Signed)
PHARMACY CONSULT NOTE  Pharmacy Consult for Electrolyte Monitoring and Replacement   Recent Labs: Potassium (mmol/L)  Date Value  12/13/2022 3.4 (L)  08/11/2013 3.3 (L)   Magnesium (mg/dL)  Date Value  59/56/3875 1.9   Calcium (mg/dL)  Date Value  64/33/2951 8.4 (L)   Calcium, Total (mg/dL)  Date Value  88/41/6606 9.2   Albumin (g/dL)  Date Value  30/16/0109 2.1 (L)  08/11/2013 3.1 (L)   Phosphorus (mg/dL)  Date Value  32/35/5732 3.6   Sodium (mmol/L)  Date Value  12/13/2022 132 (L)  08/11/2013 138    Assessment: 72 y.o. female with medical history significant for asthma, COPD, type diabetes mellitus, s/p G-tube, GERD, ESRD on HD, hypertension, osteoarthritis, paroxysmal atrial fibrillation and OSA on CPAP, who presented to the emergency room with acute onset of altered mental status with lethargy and decreased responsiveness now on CRRT.  CRRT stopped 8/25. Plan for line holiday. CVC placed 8/29 and HD performed same day. Patient is on tube feeds and having diarrhea. Hypophosphatemia suspected secondary to re-feeding / diarrhea.   Goal of Therapy:  Electrolytes WNL  Plan:  --KCL via PEG tube x1 --Re-check electrolytes again tomorrow AM  Bettey Costa 12/13/2022 8:52 AM

## 2022-12-13 NOTE — Progress Notes (Signed)
Updated pts daughter at bedside regarding pts condition and current plan of care.  All questions were answered.  Zada Girt, AGNP  Pulmonary/Critical Care Pager [phone] (please enter 7 digits) PCCM Consult Pager [phone] (please enter 7 digits)

## 2022-12-13 NOTE — Progress Notes (Signed)
NAME:  Alexandra Foster, MRN:  [id number], DOB:  1950-09-10, LOS: 7 ADMISSION DATE:  12/05/2022, CONSULTATION DATE:  12/06/2022 REFERRING MD:  Dr. Wynelle Link, CHIEF COMPLAINT:  Hypotension   Brief Pt Description / Synopsis:  72 y.o female with PMHx significant for ESRD on HD admitted with Acute Metabolic Encephalopathy in setting of Severe Sepsis with Septic Shock due to MRSA Bacteremia from infected right chest Permcath, along with Enterococcus Faecalis UTI.  History of Present Illness:  Alexandra Foster is a 72 y.o. female with past medical history significant for asthma, COPD, type diabetes mellitus, s/p G-tube, GERD, ESRD on HD, hypertension, osteoarthritis, paroxysmal atrial fibrillation and OSA on CPAP, who presented to [facility] ED on 12/05/22 from [facility] due to altered mental status with lethargy and decreased responsiveness.  There was a concern about aspiration pneumonia at his facility as the patient had an episode of nausea and vomiting and was reportedly coughing per her sister without wheezing or dyspnea or hypoxia.  The patient's temperature was 102.2 when EMS arrived to this facility.  The patient was not responding to questions throughout transport.  EMS noted the patient having slurred speech and significant weakness per family (family had not seen her since last week).  Code stroke was called upon arrival to the ED. teleneurology evaluated the patient, was not considered a candidate for thrombolytics given unclear last known well time, along with low suspicion for stroke given significantly global symptoms and multiple metabolic derangements and sepsis contributing to encephalopathy.  ED Course: Initial Vital Signs: Temperature 101.4 F orally, pulse 78, respiratory rate 19, blood pressure 125/70, SpO2 99% on room air Significant Labs: hyponatremia 128 and hypochloremia of 90 with blood glucose of 197, BUN of 67 and creatinine 4.53 with CO2 of 22 and anion gap 16. Alk phos was  157 and albumin 3.3, AST 66 and ALT 87 with total obtained of 8.6. Lactic acid was 2.3 and later 2.9. CBC showed leukocytosis of 21.4 with neutrophilia and anemia. Respiratory panel including influenza A, B, RSV and COVID-19 PCR came back negative. UA was positive for UTI alcohol level was less than 10. Urine drug screen was positive for tricyclic's.  Imaging Chest X-ray>>IMPRESSION: Mild vascular congestion likely related to volume overload. CT Head w/o contrast>>IMPRESSION: 1. No acute intracranial abnormality. 2. ASPECTS is 10. Medications Administered: IV Rocephin and Zithromax as well as 3 L bolus of IV normal saline   Hospitalist were asked to admit for further workup and treatment.  Nephrology consulted for hemodialysis.  Please see "significant hospital events" section below for full detailed hospital course.  Pertinent  Medical History   Past Medical History:  Diagnosis Date   AKI (acute kidney injury) (HCC)    a. 04/2021 in setting of bacteremia/shock.   Arthritis    Asthma    Bacteremia    a. 04/2021 S pyogenes bacteremia in setting of lower ext cellulitis.   COPD (chronic obstructive pulmonary disease) (HCC)    Diabetes mellitus without complication (HCC)    Endometriosis    GERD (gastroesophageal reflux disease)    History of echocardiogram    a. 07/2013 Echo: EF 55-60%, impaired relaxation, mild TR; b. 04/2021 Echo: EF 50-55%, mild LVH, nl RV fxn, mild BAE, Ao sclerosis w/o stenosis.   Hypertension    Obesity    PAF (paroxysmal atrial fibrillation) (HCC)    a. 04/2021 in setting of septic shock/cellulitis.   Sleep apnea    CPAP    Micro Data:  8/23:  SARS-CoV-2/RSV/Flu PCR>>negative 8/23: Blood cultures>> 4/4 bottles with MRSA & Staph epidermidis 8/23: MRSA PCR>> positive 8/23: Urine>> Enterococcus Faecalis 8/24: Permcath tip>> MRSA 8/25: Repeat Blood cultures>> no growth  Antimicrobials:   Anti-infectives (From admission, onward)    Start     Dose/Rate Route  Frequency Ordered Stop   12/11/22 2345  Ampicillin-Sulbactam (UNASYN) 3 g in sodium chloride 0.9 % 100 mL IVPB        3 g 200 mL/hr over 30 Minutes Intravenous Every 12 hours 12/11/22 2252     12/11/22 1800  vancomycin (VANCOCIN) IVPB 1000 mg/200 mL premix        1,000 mg 200 mL/hr over 60 Minutes Intravenous  Once 12/11/22 1025 12/12/22 0635   12/09/22 1800  vancomycin (VANCOREADY) IVPB 750 mg/150 mL        750 mg 150 mL/hr over 60 Minutes Intravenous  Once 12/09/22 1423 12/09/22 1848   12/08/22 1040  vancomycin variable dose per unstable renal function (pharmacist dosing)         Does not apply See admin instructions 12/08/22 1041     12/07/22 1400  vancomycin (VANCOCIN) IVPB 1000 mg/200 mL premix  Status:  Discontinued        1,000 mg 200 mL/hr over 60 Minutes Intravenous Every 24 hours 12/06/22 1234 12/08/22 1041   12/06/22 2200  cefTRIAXone (ROCEPHIN) 2 g in sodium chloride 0.9 % 100 mL IVPB  Status:  Discontinued        2 g 200 mL/hr over 30 Minutes Intravenous Every 24 hours 12/06/22 0132 12/06/22 1223   12/06/22 1500  ceFEPIme (MAXIPIME) 2 g in sodium chloride 0.9 % 100 mL IVPB  Status:  Discontinued        2 g 200 mL/hr over 30 Minutes Intravenous Every 12 hours 12/06/22 1252 12/08/22 1040   12/06/22 1315  vancomycin (VANCOREADY) IVPB 1750 mg/350 mL        1,750 mg 175 mL/hr over 120 Minutes Intravenous  Once 12/06/22 1224 12/06/22 1724   12/05/22 2130  cefTRIAXone (ROCEPHIN) 2 g in sodium chloride 0.9 % 100 mL IVPB  Status:  Discontinued        2 g 200 mL/hr over 30 Minutes Intravenous Every 24 hours 12/05/22 2115 12/06/22 0202   12/05/22 2130  azithromycin (ZITHROMAX) 500 mg in sodium chloride 0.9 % 250 mL IVPB  Status:  Discontinued        500 mg 250 mL/hr over 60 Minutes Intravenous Every 24 hours 12/05/22 2115 12/06/22 1223        Significant Hospital Events: Including procedures, antibiotic start and stop dates in addition to other pertinent events   8/23:  Presented from [facility] due to AMS, Code Stroke called due to slurred speech and weakness. Deemed not a candidate for thrombolytics due to no clear last known normal time.  Admitted by [facility] with Nephrology consultation for dialysis. 8/24: Rapid response called due to Hypotension prior to/during Hemodialysis.  Transfer to ICU for peripheral vasopressors and initiation of CRRT.  PCCM consulted to assist with vasopressors.  Blood cultures + for MRSA & Staph Epi. ID consulted. Vascular Surgery consulted, permcath removed.  Temporary HD catheter placed. 8/24: this morning with pronounce left facial droop and dysarthria, remains on CRRT on Levo @8  keep MAP>55 12/08/22: patient with multispecies sepsis, removing central line today due to bacteremia. Holding HD as per nephrology team due to shock.  12/09/22-patient with loose watery BM today, she does have skin breakdown over abd pannus.  She had re-initiation of her psychiatric medications today.  S/p pRBC transfusion overnight. Electrolytes are being repleted.  On room air but requires levophed still.  12/10/22- patient laying in bed with no acute distress.  Levophed weaned to 64mcg/kg/min, afebrile on room air.  Handling NGT feeds well.  Purewick with no UOP this am. 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  12/11/22- patient off levophed , requires HD today, will place catheter per renal team.  Wound care to see for anal wound.  With episode of emesis during HD and concern for possible aspiration. 12/12/22- Back on low dose Levophed, weaning as able. Worsening Leukocytosis, ID plans to check for C.diff.  Ongoing discussions of when to remove newly placed temporary HD catheter. 12/12/22- Remains on levophed gtt despite scheduled midodrine and droxidopa   Interim History / Subjective:  Pt c/o sacral spine pain no other complaints at this time.  No acute events overnight.  Requiring levophed gtt @9  mcg/min to maintain map goal   Objective   Blood  pressure (!) 101/51, pulse 73, temperature 98.7 F (37.1 C), temperature source Oral, resp. rate 19, height 5\' 4"  (1.626 m), weight 90.5 kg, SpO2 96%.        Intake/Output Summary (Last 24 hours) at 12/13/2022 6578 Last data filed at 12/13/2022 0534 Gross per 24 hour  Intake 1970.46 ml  Output 780 ml  Net 1190.46 ml   Filed Weights   12/11/22 0420 12/12/22 0440 12/13/22 0445  Weight: 90 kg 91.3 kg 90.5 kg    Examination: General: Acute on chronically ill-appearing obese female, laying in bed, on room air, no acute distress HENT: Atraumatic, normocephalic, neck supple, difficult to assess JVD due to body habitus Lungs: Diminished breath sounds throughout, even, non labored Cardiovascular: NSR, s1s2, no m/r/g, 2+ radial/1+ distal pulses, 1+ lower extremity edema  Abdomen: +BS x4, obese, soft, non tender, non distended Extremities: Prior left AKA Neuro: Alert and oriented and following commands, moves extremities to command (left side slightly weaker, mild to moderate dysarthria, left facial droop which family reports is baseline) GU: Purewick in place   Resolved Hospital Problem list     Assessment & Plan:   #Septic Shock #Chronic Hypotension (on midodrine outpatient) PMHx: Paroxsymal Atrial Fibrillation, HTN Echocardiogram 12/07/2022: LVEF 60-65%, RV systolic function low normal, RV size mildly enlarged, trivial mitral regurgitation - Continuous cardiac monitoring - Maintain MAP >55 per Nephrology - Vasopressors as needed to maintain MAP goal - Continue Midodrine & Droxidopa - Lactic acid is normalized - Cortisol is 35.9 - TSH and free T3 pending  - Continue outpatient amiodarone  #Severe Sepsis due to MRSA BACTEREMIA due to infected Permcath & Enterococcus Faecalis UTI #Suspected Aspiration Permcath was removed on 8/24 2D Echocardiogram without evidence of vegetations - Trend WBC and monitor fever curve  - Trend PCT  - Follow cultures  -ID following appreciate input  ~continue unasyn & vancomycin pending cultures & sensitivities - ID recommends TEE ~ but pt is extremely high risk given hypotension, would be difficult to wean from ventilatory ~ thus recommends 6 weeks of Vancomycin since unable to undergo TEE - Ongoing discussions of when to remove newly placed Temporary HD catheter (placed on 8/29)  #ESRD on Hemodialysis #Mild Hyponatremia - Trend BMP  - Ensure adequate renal perfusion - Avoid nephrotoxic agents as able - Monitor I&O's / urinary output - Replace electrolytes as indicated ~ Pharmacy following for assistance with electrolyte replacement - Nephrology following, appreciate input: HD per recommendations  #COPD without  acute exacerbation #Concern for Aspiration #OSA, uses CPAP at home - Supplemental O2 as needed to maintain O2 sats 88 to 92% - CPAP qhs - Follow intermittent Chest X-ray & ABG as needed - Bronchodilators prn - Pulmonary toilet as able - ABX as above  #Anemia of chronic kidney disease - Trend CBC - Monitor for s/sx of bleeding  - VTE prophylaxis: subcutaneous heparin  - Transfuse for Hgb <7  #Diarrhea Cdiff 08/30: negative  - Continue rectal tube  - GI panel pending   #Type 2 diabetes mellitus - CBG's q4h; Target range of 140 to 180 - SSI - Follow ICU Hypo/Hyperglycemia protocol - Hold outpatient metformin  #Acute metabolic encephalopathy in setting of sepsis and multiple metabolic derangements~improving  #? TIA (left sided weakness, left facial droop) PMHx: Depression and Anxiety CT Head 8/23 negative for acute intracranial abnormality Code stroke called 8/23, deemed not a candidate for thrombolytics given unclear last known well time CTA head and neck reviewed and shows no acute intracranial abnormality or LVO -Evaluated by inpatient Neurology on 8/25 ~ Recommendations included: "-Appreciate neurosurgery confirmation that anticoagulation can be resumed from their perspective, secure chat sent to  neurosurgeon on-call -Echocardiogram pending, may need TEE if TTE is negative to definitively exclude endocarditis (defer to primary team, cardiology and infectious disease on risk/benefit) -Anticoagulation for stroke risk prevention once endocarditis is ruled out or resolved, and after neurosurgical clearance -LDL goal less than 70, A1c goal less than 7% (A1c and lipid panel ordered), please adjust medications as needed -Carotid ultrasound -Outpatient follow-up for OSA/CPAP versus inpatient trial as this can increase stroke risk if untreated -Outpatient follow-up for pancreatic lesion as malignancy can increase stroke risk -Outpatient follow-up for IVC filter, could potentially be removed if anticoagulation is reinstituted" - Treatment of sepsis and metabolic derangements as outlined above - Provide supportive care - Promote normal sleep/wake cycle and family presence - Avoid sedating medications as able - Continue outpatient hydroxyzine, lexapro, and prn klonopin   Patient is critically ill with bacteremia and shock.  Prognosis is guarded, high risk for further decompensation, cardiac arrest and death.  Given current illness superimposed on advanced age and multiple chronic comorbidities, overall long-term prognosis is poor.  Recommend DNR status.  Palliative Care was consulted but has since signed off as pt and family very clear they wish for full code and all aggressive measures. Best Practice (right click and "Reselect all SmartList Selections" daily)   Diet/type: NPO, tube feeds DVT prophylaxis: prophylactic heparin  GI prophylaxis: N/A Lines: Right femoral trialysis and is still needed Foley:  N/A Code Status:  full code Last date of multidisciplinary goals of care discussion [12/13/22]  8/31: Will update pts daughter when she arrives at bedside   Labs   CBC: Recent Labs  Lab 12/09/22 0417 12/10/22 0551 12/11/22 0557 12/12/22 0512 12/13/22 0428  WBC 33.7* 38.8* 36.8* 49.8*  37.5*  HGB 7.4* 7.4* 8.0* 8.8* 7.9*  HCT 22.7* 23.1* 25.3* 28.5* 25.5*  MCV 83.5 85.6 86.9 87.4 90.1  PLT 213 244 301 280 351    Basic Metabolic Panel: Recent Labs  Lab 12/08/22 0525 12/08/22 0613 12/09/22 0417 12/09/22 1256 12/09/22 1835 12/10/22 0551 12/11/22 0557 12/12/22 0512 12/13/22 0428  NA 130* 132* 132*  --   --  135 139 138 132*  K 3.6 3.7 2.9*   < > 4.2 3.5 2.9* 3.9 3.4*  CL 100 102 102  --   --  103 105 96* 96*  CO2 21* 20*  20*  --   --  20* 23 25 24   GLUCOSE 178* 184* 191*  --   --  179* 132* 90 172*  BUN 41* 38* 57*  --   --  89* 99* 67* 90*  CREATININE 2.38* 2.57* 2.88*  --   --  3.31* 3.57* 2.42* 3.09*  CALCIUM 8.2* 8.2* 8.4*  --   --  8.6* 8.9 8.9 8.4*  MG 2.3 2.2 2.4  --   --   --  2.2 1.9  --   PHOS 1.7*  1.6* 1.7* 1.7*   < > 1.1* 1.1* 1.7* 2.0* 3.6   < > = values in this interval not displayed.   GFR: Estimated Creatinine Clearance: 17.4 mL/min (A) (by C-G formula based on SCr of 3.09 mg/dL (H)). Recent Labs  Lab 12/06/22 1257 12/07/22 0213 12/07/22 0213 12/08/22 0525 12/08/22 4332 12/09/22 0417 12/10/22 0551 12/11/22 0557 12/12/22 0512 12/13/22 0428  PROCALCITON  --  1.51  --  4.43  --  4.32  --   --   --   --   WBC  --  22.5*   < > 34.6*   < > 33.7* 38.8* 36.8* 49.8* 37.5*  LATICACIDVEN 1.7  --   --   --   --   --   --   --   --   --    < > = values in this interval not displayed.    Liver Function Tests: Recent Labs  Lab 12/09/22 0417 12/10/22 0551 12/11/22 0557 12/12/22 0512 12/13/22 0428  ALBUMIN 2.1* 2.2* 2.1* 2.2* 2.1*   No results for input(s): "LIPASE", "AMYLASE" in the last 168 hours. No results for input(s): "AMMONIA" in the last 168 hours.  ABG    Component Value Date/Time   PHART 7.40 05/14/2021 0341   PCO2ART 45 05/14/2021 0341   PO2ART 129 (H) 05/14/2021 0341   HCO3 27.9 05/14/2021 0341   ACIDBASEDEF 12.8 (H) 05/11/2021 0603   O2SAT 98.9 05/14/2021 0341     Coagulation Profile: No results for input(s):  "INR", "PROTIME" in the last 168 hours.   Cardiac Enzymes: No results for input(s): "CKTOTAL", "CKMB", "CKMBINDEX", "TROPONINI" in the last 168 hours.  HbA1C: Hgb A1c MFr Bld  Date/Time Value Ref Range Status  12/07/2022 02:13 AM 5.2 4.8 - 5.6 % Final    Comment:    (NOTE) Pre diabetes:          5.7%-6.4%  Diabetes:              >6.4%  Glycemic control for   <7.0% adults with diabetes   05/05/2021 04:53 AM 6.7 (H) 4.8 - 5.6 % Final    Comment:    (NOTE)         Prediabetes: 5.7 - 6.4         Diabetes: >6.4         Glycemic control for adults with diabetes: <7.0     CBG: Recent Labs  Lab 12/12/22 1542 12/12/22 1937 12/12/22 2348 12/13/22 0429 12/13/22 0746  GLUCAP 140* 158* 152* 153* 180*    Review of Systems:   Positives in BOLD: Gen: Denies fever, chills, weight change, fatigue, night sweats HEENT: Denies blurred vision, double vision, hearing loss, tinnitus, sinus congestion, rhinorrhea, sore throat, neck stiffness, dysphagia PULM: Denies shortness of breath, cough, sputum production, hemoptysis, wheezing CV: Denies chest pain, edema, orthopnea, paroxysmal nocturnal dyspnea, palpitations GI: Denies abdominal pain, nausea, vomiting, diarrhea, hematochezia, melena, constipation, change in bowel habits  GU: Denies dysuria, hematuria, polyuria, oliguria, urethral discharge Endocrine: Denies hot or cold intolerance, polyuria, polyphagia or appetite change Derm: Denies rash, dry skin, scaling or peeling skin change Heme: Denies easy bruising, bleeding, bleeding gums Neuro: Denies headache, numbness, weakness, slurred speech, loss of memory or consciousness   Past Medical History:  She,  has a past medical history of AKI (acute kidney injury) (HCC), Arthritis, Asthma, Bacteremia, COPD (chronic obstructive pulmonary disease) (HCC), Diabetes mellitus without complication (HCC), Endometriosis, GERD (gastroesophageal reflux disease), History of echocardiogram,  Hypertension, Obesity, PAF (paroxysmal atrial fibrillation) (HCC), and Sleep apnea.   Surgical History:   Past Surgical History:  Procedure Laterality Date   ABDOMINAL HYSTERECTOMY     APPLICATION OF WOUND VAC Left 05/17/2021   Procedure: APPLICATION OF WOUND VAC/WOUND VAC EXCHANGE-Matrix Myriad;  Surgeon: Carolan Shiver, MD;  Location: [facility] ORS;  Service: General;  Laterality: Left;   APPLICATION OF WOUND VAC  05/24/2021   Procedure: APPLICATION OF WOUND VAC;  Surgeon: Carolan Shiver, MD;  Location: [facility] OR[facility];  Service: General;;   APPLICATION OF WOUND VAC  05/10/2021   Procedure: APPLICATION OF WOUND VAC;  Surgeon: Carolan Shiver, MD;  Location: [facility];  Service: General;;   COLONOSCOPY  10/29/2006   Dr Servando Snare   COLONOSCOPY WITH PROPOFOL N/A 11/05/2016   Procedure: COLONOSCOPY WITH PROPOFOL;  Surgeon: Earline Mayotte, MD;  Location: [facility];  Service: Endoscopy;  Laterality: N/A;   INCISION AND DRAINAGE OF WOUND Left 05/24/2021   Procedure: IRRIGATION AND DEBRIDEMENT LEFT LEG;  Surgeon: Carolan Shiver, MD;  Location: [facility];  Service: General;  Laterality: Left;   INCISION AND DRAINAGE OF WOUND Left 05/10/2021   Procedure: IRRIGATION AND DEBRIDEMENT LEFT LEG;  Surgeon: Carolan Shiver, MD;  Location: [facility];  Service: General;  Laterality: Left;   IR FLUORO GUIDE CV LINE RIGHT  06/12/2021   IR PERC TUN PERIT CATH WO PORT S&I /IMAG  06/12/2021   IR REPLC GASTRO/COLONIC TUBE PERCUT W/FLUORO  06/12/2021   IVC FILTER INSERTION N/A 05/29/2021   Procedure: IVC FILTER INSERTION;  Surgeon: Renford Dills, MD;  Location: [facility];  Service: Cardiovascular;  Laterality: N/A;   MINOR GRAFT APPLICATION  05/24/2021   Procedure: Myriad Matrix  APPLICATION;  Surgeon: Carolan Shiver, MD;  Location: [facility];  Service: General;;   NASAL SINUS SURGERY  2002   Dr Chestine Spore   PEG PLACEMENT N/A 05/28/2021   Procedure: PERCUTANEOUS ENDOSCOPIC GASTROSTOMY  (PEG) PLACEMENT;  Surgeon: Sung Amabile, DO;  Location: [facility];  Service: General;  Laterality: N/A;  TRAVEL CASE   TRACHEOSTOMY TUBE PLACEMENT N/A 05/17/2021   Procedure: TRACHEOSTOMY;  Surgeon: Geanie Logan, MD;  Location: [facility];  Service: ENT;  Laterality: N/A;   WOUND DEBRIDEMENT Left 05/07/2021   Procedure: DEBRIDEMENT WOUND;  Surgeon: Carolan Shiver, MD;  Location: [facility];  Service: General;  Laterality: Left;     Social History:   reports that she quit smoking about 21 years ago. Her smoking use included cigarettes. She started smoking about 61 years ago. She has a 80 pack-year smoking history. She quit smokeless tobacco use about 21 years ago.  Her smokeless tobacco use included snuff. She reports that she does not drink alcohol and does not use drugs.   Family History:  Her family history includes Alcohol abuse in her brother; Breast cancer (age of onset: 11) in her sister; Congestive Heart Failure in her mother; Coronary artery disease (age of onset: 68) in her father; Heart disease  in her brother; Varicose Veins in her brother.   Allergies No Known Allergies   Home Medications  Prior to Admission medications   Medication Sig Start Date End Date Taking? Authorizing Provider  amiodarone (PACERONE) 200 MG tablet Place 1 tablet (200 mg total) into feeding tube 2 (two) times daily. 06/13/21  Yes Erin Fulling, MD  ascorbic acid (VITAMIN C) 500 MG tablet Place 1 tablet (500 mg total) into feeding tube 2 (two) times daily. 06/13/21  Yes Kasa, Wallis Bamberg, MD  busPIRone (BUSPAR) 5 MG tablet Take 5 mg by mouth 2 (two) times daily. 12/04/22  Yes [provider]  cholecalciferol (VITAMIN D) 25 MCG tablet Place 2 tablets (2,000 Units total) into feeding tube daily. 06/14/21  Yes Kasa, Wallis Bamberg, MD  clonazePAM (KLONOPIN) 0.5 MG tablet Take 0.25-0.5 mg by mouth 2 (two) times daily. 0.25 mg every morning and 0.5 mg at bedtime for Agitation 11/18/22  Yes [provider]   escitalopram (LEXAPRO) 10 MG tablet Place 1 tablet (10 mg total) into feeding tube daily. 06/14/21  Yes Erin Fulling, MD  hydrOXYzine (ATARAX) 50 MG tablet Take 100 mg by mouth 2 (two) times daily. 12/02/22  Yes [provider]  lip balm (BLISTEX) OINT Apply 1 application topically 3 (three) times daily. 06/13/21  Yes Erin Fulling, MD  loperamide (IMODIUM) 2 MG capsule Take 1 capsule (2 mg total) by mouth as needed for diarrhea or loose stools. 06/13/21  Yes Erin Fulling, MD  midodrine (PROAMATINE) 5 MG tablet Place 3 tablets (15 mg total) into feeding tube 3 (three) times daily with meals. 06/13/21  Yes Erin Fulling, MD  multivitamin (RENA-VIT) TABS tablet Place 1 tablet into feeding tube at bedtime. 06/13/21  Yes Erin Fulling, MD  omeprazole (PRILOSEC) 20 MG capsule Take 20 mg by mouth daily. 11/12/22  Yes [provider]  sennosides (SENOKOT) 8.8 MG/5ML syrup Place 10 mLs into feeding tube at bedtime as needed for moderate constipation. 06/13/21  Yes Erin Fulling, MD  artificial tears (LACRILUBE) OINT ophthalmic ointment Place into both eyes every 4 (four) hours as needed for dry eyes. Patient not taking: Reported on 12/06/2022 06/13/21   Erin Fulling, MD  chlorhexidine gluconate, MEDLINE KIT, (PERIDEX) 0.12 % solution 15 mLs by Mouth Rinse route 2 (two) times daily. Patient not taking: Reported on 12/06/2022 06/13/21   Erin Fulling, MD  collagenase (SANTYL) ointment Apply topically daily. Patient not taking: Reported on 12/06/2022 06/14/21   Erin Fulling, MD  dextrose 5 % SOLN 100 mL with copper chloride 0.4 MG/ML SOLN 2 mg Inject 2 mg into the vein daily. 06/14/21   Erin Fulling, MD  ipratropium-albuterol (DUONEB) 0.5-2.5 (3) MG/3ML SOLN Take 3 mLs by nebulization 3 (three) times daily. Patient not taking: Reported on 12/06/2022 06/13/21   Erin Fulling, MD  lactobacillus (FLORANEX/LACTINEX) PACK Place 1 packet (1 g total) into feeding tube 3 (three) times daily with meals. Patient not taking: Reported  on 12/06/2022 06/13/21   Erin Fulling, MD  pantoprazole sodium (PROTONIX) 40 mg Place 40 mg into feeding tube daily. Patient not taking: Reported on 12/06/2022 06/13/21   Erin Fulling, MD  Scheduled Meds:  amiodarone  200 mg Per Tube BID   ascorbic acid  500 mg Per Tube BID   atorvastatin  40 mg Per Tube Daily   chlorhexidine  15 mL Mouth Rinse BID   Chlorhexidine Gluconate Cloth  6 each Topical Q0600   vitamin D3  2,000 Units Per Tube Daily   [START ON 12/18/2022]  copper  2 mg Per Tube Daily   droxidopa  200 mg Per Tube TID WC   epoetin (EPOGEN/PROCRIT) injection  10,000 Units Intravenous Q T,Th,Sa-HD   escitalopram  10 mg Per Tube Daily   famotidine  10 mg Per Tube Daily   feeding supplement (NEPRO CARB STEADY)  1,000 mL Per Tube Q24H   feeding supplement (PROSource TF20)  60 mL Per Tube Daily   free water  30 mL Per Tube Q4H   Gerhardt's butt cream   Topical BID   heparin injection (subcutaneous)  5,000 Units Subcutaneous Q8H   hydrOXYzine  50 mg Per Tube Q6H   insulin aspart  0-6 Units Subcutaneous Q4H   midodrine  15 mg Per Tube TID WC   multivitamin  1 tablet Per Tube QHS   mupirocin ointment   Nasal BID   nutrition supplement (JUVEN)  1 packet Per Tube BID BM   mouth rinse  15 mL Mouth Rinse 4 times per day   vancomycin variable dose per unstable renal function (pharmacist dosing)   Does not apply See admin instructions   Continuous Infusions:  sodium chloride Stopped (12/07/22 0210)   sodium chloride Stopped (12/08/22 1823)   ampicillin-sulbactam (UNASYN) IV Stopped (12/13/22 0103)   anticoagulant sodium citrate     copper chloride 2 mg in dextrose 5 % 100 mL IVPB Stopped (12/12/22 1243)   norepinephrine (LEVOPHED) Adult infusion 9 mcg/min (12/13/22 0534)   PRN Meds:.sodium chloride, acetaminophen **OR** acetaminophen, alteplase, alteplase, anticoagulant sodium citrate, artificial tears, clonazePAM, fentaNYL (SUBLIMAZE) injection, heparin, heparin, ipratropium-albuterol,  lidocaine (PF), lidocaine-prilocaine, loperamide HCl, magnesium hydroxide, ondansetron **OR** ondansetron (ZOFRAN) IV, mouth rinse, pentafluoroprop-tetrafluoroeth, sennosides, traZODone   Critical care time: 40 minutes    Zada Girt, AGNP  Pulmonary/Critical Care Pager [phone] (please enter 7 digits) PCCM Consult Pager [phone] (please enter 7 digits)

## 2022-12-14 DIAGNOSIS — A419 Sepsis, unspecified organism: Secondary | ICD-10-CM | POA: Diagnosis not present

## 2022-12-14 DIAGNOSIS — R6521 Severe sepsis with septic shock: Secondary | ICD-10-CM | POA: Diagnosis not present

## 2022-12-14 LAB — CBC
HCT: 25.3 % — ABNORMAL LOW (ref 36.0–46.0)
Hemoglobin: 7.9 g/dL — ABNORMAL LOW (ref 12.0–15.0)
MCH: 27.3 pg (ref 26.0–34.0)
MCHC: 31.2 g/dL (ref 30.0–36.0)
MCV: 87.5 fL (ref 80.0–100.0)
Platelets: 328 10*3/uL (ref 150–400)
RBC: 2.89 MIL/uL — ABNORMAL LOW (ref 3.87–5.11)
RDW: 22 % — ABNORMAL HIGH (ref 11.5–15.5)
WBC: 40.2 10*3/uL — ABNORMAL HIGH (ref 4.0–10.5)
nRBC: 4.4 % — ABNORMAL HIGH (ref 0.0–0.2)

## 2022-12-14 LAB — RENAL FUNCTION PANEL
Albumin: 2.1 g/dL — ABNORMAL LOW (ref 3.5–5.0)
Anion gap: 11 (ref 5–15)
BUN: 43 mg/dL — ABNORMAL HIGH (ref 8–23)
CO2: 27 mmol/L (ref 22–32)
Calcium: 8.2 mg/dL — ABNORMAL LOW (ref 8.9–10.3)
Chloride: 98 mmol/L (ref 98–111)
Creatinine, Ser: 1.72 mg/dL — ABNORMAL HIGH (ref 0.44–1.00)
GFR, Estimated: 31 mL/min — ABNORMAL LOW (ref >60)
Glucose, Bld: 160 mg/dL — ABNORMAL HIGH (ref 70–99)
Phosphorus: 2.3 mg/dL — ABNORMAL LOW (ref 2.5–4.6)
Potassium: 3.6 mmol/L (ref 3.5–5.1)
Sodium: 136 mmol/L (ref 135–145)

## 2022-12-14 LAB — GLUCOSE, CAPILLARY
Glucose-Capillary: 120 mg/dL — ABNORMAL HIGH (ref 70–99)
Glucose-Capillary: 130 mg/dL — ABNORMAL HIGH (ref 70–99)
Glucose-Capillary: 136 mg/dL — ABNORMAL HIGH (ref 70–99)
Glucose-Capillary: 138 mg/dL — ABNORMAL HIGH (ref 70–99)
Glucose-Capillary: 148 mg/dL — ABNORMAL HIGH (ref 70–99)
Glucose-Capillary: 174 mg/dL — ABNORMAL HIGH (ref 70–99)

## 2022-12-14 LAB — VITAMIN C: Vitamin C: 1 mg/dL (ref 0.4–2.0)

## 2022-12-14 LAB — MAGNESIUM: Magnesium: 1.5 mg/dL — ABNORMAL LOW (ref 1.7–2.4)

## 2022-12-14 LAB — PROCALCITONIN: Procalcitonin: 3.18 ng/mL

## 2022-12-14 LAB — CORTISOL: Cortisol, Plasma: 15.5 ug/dL

## 2022-12-14 LAB — C-REACTIVE PROTEIN: CRP: 3.8 mg/dL — ABNORMAL HIGH (ref <1.0)

## 2022-12-14 MED ORDER — FLUDROCORTISONE ACETATE 0.1 MG PO TABS
0.2000 mg | ORAL_TABLET | Freq: Every day | ORAL | Status: DC
  Administered 2022-12-14 – 2023-01-03 (×19): 0.2 mg
  Filled 2022-12-14 (×22): qty 2

## 2022-12-14 MED ORDER — MAGNESIUM SULFATE 4 GM/100ML IV SOLN
4.0000 g | Freq: Once | INTRAVENOUS | Status: AC
  Administered 2022-12-14: 4 g via INTRAVENOUS
  Filled 2022-12-14: qty 100

## 2022-12-14 MED ORDER — POTASSIUM & SODIUM PHOSPHATES 280-160-250 MG PO PACK
1.0000 | PACK | Freq: Once | ORAL | Status: AC
  Administered 2022-12-14: 1
  Filled 2022-12-14: qty 1

## 2022-12-14 MED ORDER — FLUDROCORTISONE ACETATE 0.1 MG PO TABS: 0.2000 mg | ORAL_TABLET | Freq: Every day | ORAL | Status: DC

## 2022-12-14 NOTE — Progress Notes (Signed)
[facility]  ROUNDING NOTE   Subjective:   Patient remains critically ill.    Still requiring Levophed.  Able to follow simple commands Now has a right femoral temp cath.  Objective:  Vital signs in last 24 hours:  Temp:  [98.2 F (36.8 C)-98.7 F (37.1 C)] 98.2 F (36.8 C) (09/01 0400) Pulse Rate:  [44-95] 88 (09/01 0700) Resp:  [13-37] 25 (09/01 0700) BP: (66-193)/(34-169) 106/47 (09/01 0700) SpO2:  [92 %-100 %] 94 % (09/01 0700) Weight:  [93.4 kg] 93.4 kg (09/01 0500)  Weight change: 2.9 kg Filed Weights   12/12/22 0440 12/13/22 0445 12/14/22 0500  Weight: 91.3 kg 90.5 kg 93.4 kg    Intake/Output: I/O last 3 completed shifts: In: 3739.9 [I.V.:1233.1; Other:180; NG/GT:1726.9; IV Piggyback:599.9] Out: 2050 [Urine:1650; Stool:400]   Intake/Output this shift:  No intake/output data recorded.  Physical Exam: General: Critically ill  Head: Normocephalic, atraumatic. Moist oral mucosal membranes  Eyes: Anicteric  Lungs:  Diminished bilaterally   Heart: regular   Abdomen:  Soft, nontender  Extremities:  1+ peripheral edema.  Neurologic: Alert to self and place, not to situation or time   Skin: No lesions  Access:  Right femoral temp HD catheter 8/29.     Basic Metabolic Panel: Recent Labs  Lab 12/08/22 0613 12/09/22 0417 12/09/22 1256 12/10/22 0551 12/11/22 0557 12/12/22 0512 12/13/22 0428 12/14/22 0440  NA 132* 132*  --  135 139 138 132* 136  K 3.7 2.9*   < > 3.5 2.9* 3.9 3.4* 3.6  CL 102 102  --  103 105 96* 96* 98  CO2 20* 20*  --  20* 23 25 24 27   GLUCOSE 184* 191*  --  179* 132* 90 172* 160*  BUN 38* 57*  --  89* 99* 67* 90* 43*  CREATININE 2.57* 2.88*  --  3.31* 3.57* 2.42* 3.09* 1.72*  CALCIUM 8.2* 8.4*  --  8.6* 8.9 8.9 8.4* 8.2*  MG 2.2 2.4  --   --  2.2 1.9  --  1.5*  PHOS 1.7* 1.7*   < > 1.1* 1.7* 2.0* 3.6 2.3*   < > = values in this interval not displayed.    Liver Function Tests: Recent Labs  Lab 12/10/22 0551  12/11/22 0557 12/12/22 0512 12/13/22 0428 12/14/22 0440  ALBUMIN 2.2* 2.1* 2.2* 2.1* 2.1*   No results for input(s): "LIPASE", "AMYLASE" in the last 168 hours. No results for input(s): "AMMONIA" in the last 168 hours.  CBC: Recent Labs  Lab 12/10/22 0551 12/11/22 0557 12/12/22 0512 12/13/22 0428 12/14/22 0440  WBC 38.8* 36.8* 49.8* 37.5* 40.2*  HGB 7.4* 8.0* 8.8* 7.9* 7.9*  HCT 23.1* 25.3* 28.5* 25.5* 25.3*  MCV 85.6 86.9 87.4 90.1 87.5  PLT 244 301 280 351 328    Cardiac Enzymes: No results for input(s): "CKTOTAL", "CKMB", "CKMBINDEX", "TROPONINI" in the last 168 hours.  BNP: Invalid input(s): "POCBNP"  CBG: Recent Labs  Lab 12/13/22 1610 12/13/22 2002 12/13/22 2346 12/14/22 0411 12/14/22 0751  GLUCAP 126* 142* 155* 148* 174*    Microbiology: Results for orders placed or performed during the hospital encounter of 12/05/22  Culture, blood (Routine x 2)     Status: Abnormal   Collection Time: 12/05/22  8:56 PM   Specimen: BLOOD  Result Value Ref Range Status   Specimen Description   Final    BLOOD BLOOD RIGHT ARM Performed at [facility], [address] [place], [place] [address]    Special Requests  Final    BOTTLES DRAWN AEROBIC AND ANAEROBIC Blood Culture adequate volume Performed at [facility], [address] [place], [place] [address]    Culture  Setup Time   Final    GRAM POSITIVE COCCI IN BOTH AEROBIC AND ANAEROBIC BOTTLES CRITICAL RESULT CALLED TO, READ BACK BY AND VERIFIED WITH: SHEEMA HALLLAJI AT 1206 12/06/22.PMF Performed at [facility], [address] [place], [place] [address]    Culture (A)  Final    STAPHYLOCOCCUS AUREUS SUSCEPTIBILITIES PERFORMED ON PREVIOUS CULTURE WITHIN THE LAST 5 DAYS. Performed at [facility], [address] [place], [place] [address]    Report Status 12/08/2022 FINAL  Final  Culture, blood (Routine x 2)     Status: Abnormal   Collection Time: 12/05/22  8:57 PM    Specimen: BLOOD  Result Value Ref Range Status   Specimen Description   Final    BLOOD BLOOD LEFT ARM Performed at [facility], [address], [place], [place] [address]    Special Requests   Final    BOTTLES DRAWN AEROBIC AND ANAEROBIC Blood Culture adequate volume Performed at [facility], [address] [place], [place] [address]    Culture  Setup Time   Final    GRAM POSITIVE COCCI IN BOTH AEROBIC AND ANAEROBIC BOTTLES CRITICAL RESULT CALLED TO, READ BACK BY AND VERIFIED WITH: SHEEMA HALLAJI AT 1206 12/06/22.PMF Performed at [facility], [address], [place], [place] [address]    Culture METHICILLIN RESISTANT STAPHYLOCOCCUS AUREUS (A)  Final   Report Status 12/08/2022 FINAL  Final   Organism ID, Bacteria METHICILLIN RESISTANT STAPHYLOCOCCUS AUREUS  Final      Susceptibility   Methicillin resistant staphylococcus aureus - MIC*    CIPROFLOXACIN >=8 RESISTANT Resistant     ERYTHROMYCIN >=8 RESISTANT Resistant     GENTAMICIN <=0.5 SENSITIVE Sensitive     OXACILLIN >=4 RESISTANT Resistant     TETRACYCLINE <=1 SENSITIVE Sensitive     VANCOMYCIN <=0.5 SENSITIVE Sensitive     TRIMETH/SULFA >=320 RESISTANT Resistant     CLINDAMYCIN <=0.25 SENSITIVE Sensitive     RIFAMPIN <=0.5 SENSITIVE Sensitive     Inducible Clindamycin NEGATIVE Sensitive     LINEZOLID 2 SENSITIVE Sensitive     * METHICILLIN RESISTANT STAPHYLOCOCCUS AUREUS  Blood Culture ID Panel (Reflexed)     Status: Abnormal   Collection Time: 12/05/22  8:57 PM  Result Value Ref Range Status   Enterococcus faecalis NOT DETECTED NOT DETECTED Final   Enterococcus Faecium NOT DETECTED NOT DETECTED Final   Listeria monocytogenes NOT DETECTED NOT DETECTED Final   Staphylococcus species DETECTED (A) NOT DETECTED Final    Comment: CRITICAL RESULT CALLED TO, READ BACK BY AND VERIFIED WITH: SHEEMA HALLAJI AT 1206 12/06/22.PMF    Staphylococcus aureus (BCID) DETECTED (A) NOT DETECTED Final    Comment:  Methicillin (oxacillin)-resistant Staphylococcus aureus (MRSA). MRSA is predictably resistant to beta-lactam antibiotics (except ceftaroline). Preferred therapy is vancomycin unless clinically contraindicated. Patient requires contact precautions if  hospitalized. CRITICAL RESULT CALLED TO, READ BACK BY AND VERIFIED WITH: SHEEMA HALLAJI AT 1206 12/06/22.PMF    Staphylococcus epidermidis DETECTED (A) NOT DETECTED Final    Comment: CRITICAL RESULT CALLED TO, READ BACK BY AND VERIFIED WITH: SHEEMA HALLAJI AT 1206 12/06/22.PMF    Staphylococcus lugdunensis NOT DETECTED NOT DETECTED Final   Streptococcus species NOT DETECTED NOT DETECTED Final   Streptococcus agalactiae NOT DETECTED NOT DETECTED Final   Streptococcus pneumoniae NOT DETECTED  NOT DETECTED Final   Streptococcus pyogenes NOT DETECTED NOT DETECTED Final   A.calcoaceticus-baumannii NOT DETECTED NOT DETECTED Final   Bacteroides fragilis NOT DETECTED NOT DETECTED Final   Enterobacterales NOT DETECTED NOT DETECTED Final   Enterobacter cloacae complex NOT DETECTED NOT DETECTED Final   Escherichia coli NOT DETECTED NOT DETECTED Final   Klebsiella aerogenes NOT DETECTED NOT DETECTED Final   Klebsiella oxytoca NOT DETECTED NOT DETECTED Final   Klebsiella pneumoniae NOT DETECTED NOT DETECTED Final   Proteus species NOT DETECTED NOT DETECTED Final   Salmonella species NOT DETECTED NOT DETECTED Final   Serratia marcescens NOT DETECTED NOT DETECTED Final   Haemophilus influenzae NOT DETECTED NOT DETECTED Final   Neisseria meningitidis NOT DETECTED NOT DETECTED Final   Pseudomonas aeruginosa NOT DETECTED NOT DETECTED Final   Stenotrophomonas maltophilia NOT DETECTED NOT DETECTED Final   Candida albicans NOT DETECTED NOT DETECTED Final   Candida auris NOT DETECTED NOT DETECTED Final   Candida glabrata NOT DETECTED NOT DETECTED Final   Candida krusei NOT DETECTED NOT DETECTED Final   Candida parapsilosis NOT DETECTED NOT DETECTED Final    Candida tropicalis NOT DETECTED NOT DETECTED Final   Cryptococcus neoformans/gattii NOT DETECTED NOT DETECTED Final   Methicillin resistance mecA/C DETECTED (A) NOT DETECTED Final    Comment: CRITICAL RESULT CALLED TO, READ BACK BY AND VERIFIED WITH: SHEEMA HALLAJI AT 1206 12/06/22.PMF    Meth resistant mecA/C and MREJ DETECTED (A) NOT DETECTED Final    Comment: CRITICAL RESULT CALLED TO, READ BACK BY AND VERIFIED WITH: SHEEMA HALLAJI AT 1206 12/06/22.PMF Performed at [facility], [address] [place], [place] [address]   Resp panel by RT-PCR (RSV, Flu A&B, Covid) Anterior Nasal Swab     Status: None   Collection Time: 12/05/22  9:53 PM   Specimen: Anterior Nasal Swab  Result Value Ref Range Status   SARS Coronavirus 2 by RT PCR NEGATIVE NEGATIVE Final    Comment: (NOTE) SARS-CoV-2 target nucleic acids are NOT DETECTED.  The SARS-CoV-2 RNA is generally detectable in upper respiratory specimens during the acute phase of infection. The lowest concentration of SARS-CoV-2 viral copies this assay can detect is 138 copies/mL. A negative result does not preclude SARS-Cov-2 infection and should not be used as the sole basis for treatment or other patient management decisions. A negative result may occur with  improper specimen collection/handling, submission of specimen other than nasopharyngeal swab, presence of viral mutation(s) within the areas targeted by this assay, and inadequate number of viral copies(<138 copies/mL). A negative result must be combined with clinical observations, patient history, and epidemiological information. The expected result is Negative.  Fact Sheet for Patients:  BloggerCourse.com  Fact Sheet for Healthcare Providers:  SeriousBroker.it  This test is no t yet approved or cleared by the [place] FDA and  has been authorized for detection and/or diagnosis of SARS-CoV-2 by FDA under an  Emergency Use Authorization (EUA). This EUA will remain  in effect (meaning this test can be used) for the duration of the COVID-19 declaration under Section 564(b)(1) of the Act, 21 U.S.C.section 360bbb-3(b)(1), unless the authorization is terminated  or revoked sooner.       Influenza A by PCR NEGATIVE NEGATIVE Final   Influenza B by PCR NEGATIVE NEGATIVE Final    Comment: (NOTE) The Xpert Xpress SARS-CoV-2/FLU/RSV plus assay is intended as an aid in the diagnosis of influenza from Nasopharyngeal swab specimens and should not be used as a sole basis for treatment.  Nasal washings and aspirates are unacceptable for Xpert Xpress SARS-CoV-2/FLU/RSV testing.  Fact Sheet for Patients: BloggerCourse.com  Fact Sheet for Healthcare Providers: SeriousBroker.it  This test is not yet approved or cleared by the [place] FDA and has been authorized for detection and/or diagnosis of SARS-CoV-2 by FDA under an Emergency Use Authorization (EUA). This EUA will remain in effect (meaning this test can be used) for the duration of the COVID-19 declaration under Section 564(b)(1) of the Act, 21 U.S.C. section 360bbb-3(b)(1), unless the authorization is terminated or revoked.     Resp Syncytial Virus by PCR NEGATIVE NEGATIVE Final    Comment: (NOTE) Fact Sheet for Patients: BloggerCourse.com  Fact Sheet for Healthcare Providers: SeriousBroker.it  This test is not yet approved or cleared by the [place] FDA and has been authorized for detection and/or diagnosis of SARS-CoV-2 by FDA under an Emergency Use Authorization (EUA). This EUA will remain in effect (meaning this test can be used) for the duration of the COVID-19 declaration under Section 564(b)(1) of the Act, 21 U.S.C. section 360bbb-3(b)(1), unless the authorization is terminated or revoked.  Performed at [facility], [address] [place], [place] [address]   MRSA Next Gen by PCR, Nasal     Status: Abnormal   Collection Time: 12/06/22  4:27 AM   Specimen: Nasal Mucosa; Nasal Swab  Result Value Ref Range Status   MRSA by PCR Next Gen DETECTED (A) NOT DETECTED Final    Comment: RESULT CALLED TO, READ BACK BY AND VERIFIED WITH: KRISTINE CHAMBERS AT 4696 12/06/22.PMF (NOTE) The GeneXpert MRSA Assay (FDA approved for NASAL specimens only), is one component of a comprehensive MRSA colonization surveillance program. It is not intended to diagnose MRSA infection nor to guide or monitor treatment for MRSA infections. Test performance is not FDA approved in patients less than 24 years old. Performed at [facility], [address] [place], [place] [address]   Cath Tip Culture     Status: Abnormal   Collection Time: 12/06/22  2:22 PM   Specimen: Catheter Tip; Other  Result Value Ref Range Status   Specimen Description   Final    CATH TIP Performed at [facility], [address] [place], [place] [address]    Special Requests   Final    NONE Performed at [facility], [address] [place], [place] [address]    Culture (A)  Final    >=100,000 COLONIES/mL METHICILLIN RESISTANT STAPHYLOCOCCUS AUREUS   Report Status 12/08/2022 FINAL  Final   Organism ID, Bacteria METHICILLIN RESISTANT STAPHYLOCOCCUS AUREUS (A)  Final      Susceptibility   Methicillin resistant staphylococcus aureus - MIC*    CIPROFLOXACIN >=8 RESISTANT Resistant     ERYTHROMYCIN >=8 RESISTANT Resistant     GENTAMICIN <=0.5 SENSITIVE Sensitive     OXACILLIN >=4 RESISTANT Resistant     TETRACYCLINE <=1 SENSITIVE Sensitive     VANCOMYCIN <=0.5 SENSITIVE Sensitive     TRIMETH/SULFA >=320 RESISTANT Resistant     CLINDAMYCIN <=0.25 SENSITIVE Sensitive     RIFAMPIN <=0.5 SENSITIVE Sensitive     Inducible Clindamycin NEGATIVE Sensitive     LINEZOLID 2 SENSITIVE Sensitive     * >=100,000  COLONIES/mL METHICILLIN RESISTANT STAPHYLOCOCCUS AUREUS  Urine Culture (for pregnant, neutropenic or urologic patients or patients with an indwelling urinary catheter)     Status: Abnormal   Collection Time: 12/06/22  5:41 PM   Specimen: Urine, Clean Catch  Result Value Ref Range  Status   Specimen Description   Final    URINE, CLEAN CATCH Performed at [facility], [address] [place], [place] [address]    Special Requests   Final    Normal Performed at [facility], [address] [place], [place] [address]    Culture (A)  Final    70,000 COLONIES/mL ENTEROCOCCUS FAECALIS 70,000 COLONIES/mL YEAST    Report Status 12/09/2022 FINAL  Final   Organism ID, Bacteria ENTEROCOCCUS FAECALIS (A)  Final      Susceptibility   Enterococcus faecalis - MIC*    AMPICILLIN <=2 SENSITIVE Sensitive     NITROFURANTOIN <=16 SENSITIVE Sensitive     VANCOMYCIN 1 SENSITIVE Sensitive     * 70,000 COLONIES/mL ENTEROCOCCUS FAECALIS  Culture, blood (Routine X 2) w Reflex to ID Panel     Status: None   Collection Time: 12/07/22  1:31 PM   Specimen: BLOOD  Result Value Ref Range Status   Specimen Description BLOOD BLOOD RIGHT HAND  Final   Special Requests   Final    BOTTLES DRAWN AEROBIC ONLY Blood Culture adequate volume   Culture   Final    NO GROWTH 5 DAYS Performed at [facility], [address] [place], [place] [address]    Report Status 12/12/2022 FINAL  Final  Culture, blood (Routine X 2) w Reflex to ID Panel     Status: None   Collection Time: 12/07/22  6:41 PM   Specimen: BLOOD  Result Value Ref Range Status   Specimen Description BLOOD BLOOD RIGHT HAND  Final   Special Requests   Final    BOTTLES DRAWN AEROBIC AND ANAEROBIC Blood Culture results may not be optimal due to an inadequate volume of blood received in culture bottles   Culture   Final    NO GROWTH 5 DAYS Performed at [facility], [address], [place], [place] [address]     Report Status 12/12/2022 FINAL  Final  C Difficile Quick Screen (NO PCR Reflex)     Status: None   Collection Time: 12/12/22  4:07 PM   Specimen: STOOL  Result Value Ref Range Status   C Diff antigen NEGATIVE NEGATIVE Final   C Diff toxin NEGATIVE NEGATIVE Final   C Diff interpretation No C. difficile detected.  Final    Comment: Performed at [facility], [address] [place], [place] [address]  Gastrointestinal Panel by PCR , Stool     Status: None   Collection Time: 12/13/22  8:38 AM   Specimen: Stool  Result Value Ref Range Status   Campylobacter species NOT DETECTED NOT DETECTED Final   Plesimonas shigelloides NOT DETECTED NOT DETECTED Final   Salmonella species NOT DETECTED NOT DETECTED Final   Yersinia enterocolitica NOT DETECTED NOT DETECTED Final   Vibrio species NOT DETECTED NOT DETECTED Final   Vibrio cholerae NOT DETECTED NOT DETECTED Final   Enteroaggregative E coli (EAEC) NOT DETECTED NOT DETECTED Final   Enteropathogenic E coli (EPEC) NOT DETECTED NOT DETECTED Final   Enterotoxigenic E coli (ETEC) NOT DETECTED NOT DETECTED Final   Shiga like toxin producing E coli (STEC) NOT DETECTED NOT DETECTED Final   Shigella/Enteroinvasive E coli (EIEC) NOT DETECTED NOT DETECTED Final   Cryptosporidium NOT DETECTED NOT DETECTED Final   Cyclospora cayetanensis NOT DETECTED NOT DETECTED Final   Entamoeba histolytica NOT DETECTED NOT DETECTED Final   Giardia lamblia NOT DETECTED NOT DETECTED Final   Adenovirus F40/41 NOT DETECTED NOT  DETECTED Final   Astrovirus NOT DETECTED NOT DETECTED Final   Norovirus GI/GII NOT DETECTED NOT DETECTED Final   Rotavirus A NOT DETECTED NOT DETECTED Final   Sapovirus (I, II, IV, and V) NOT DETECTED NOT DETECTED Final    Comment: Performed at [facility], [address] [place], [place] [address]    Coagulation Studies: No results for input(s): "LABPROT", "INR" in the last 72 hours.   Urinalysis: No results for  input(s): "COLORURINE", "LABSPEC", "PHURINE", "GLUCOSEU", "HGBUR", "BILIRUBINUR", "KETONESUR", "PROTEINUR", "UROBILINOGEN", "NITRITE", "LEUKOCYTESUR" in the last 72 hours.  Invalid input(s): "APPERANCEUR"     Imaging: No results found.   Medications:    sodium chloride Stopped (12/07/22 0210)   sodium chloride Stopped (12/08/22 1823)   ampicillin-sulbactam (UNASYN) IV Stopped (12/14/22 0008)   anticoagulant sodium citrate     copper chloride 2 mg in dextrose 5 % 100 mL IVPB Stopped (12/13/22 1249)   magnesium sulfate bolus IVPB 4 g (12/14/22 0636)   norepinephrine (LEVOPHED) Adult infusion 8 mcg/min (12/14/22 1027)    amiodarone  200 mg Per Tube BID   ascorbic acid  500 mg Per Tube BID   atorvastatin  40 mg Per Tube Daily   chlorhexidine  15 mL Mouth Rinse BID   Chlorhexidine Gluconate Cloth  6 each Topical Q0600   vitamin D3  2,000 Units Per Tube Daily   [START ON 12/18/2022] copper  2 mg Per Tube Daily   droxidopa  200 mg Per Tube TID WC   epoetin (EPOGEN/PROCRIT) injection  10,000 Units Intravenous Q T,Th,Sa-HD   escitalopram  10 mg Per Tube Daily   famotidine  10 mg Per Tube Daily   feeding supplement (NEPRO CARB STEADY)  1,000 mL Per Tube Q24H   feeding supplement (PROSource TF20)  60 mL Per Tube Daily   free water  30 mL Per Tube Q4H   Gerhardt's butt cream   Topical BID   heparin injection (subcutaneous)  5,000 Units Subcutaneous Q8H   hydrOXYzine  50 mg Per Tube Q6H   insulin aspart  0-6 Units Subcutaneous Q4H   midodrine  15 mg Per Tube TID WC   multivitamin  1 tablet Per Tube QHS   mupirocin ointment   Nasal BID   nutrition supplement (JUVEN)  1 packet Per Tube BID BM   mouth rinse  15 mL Mouth Rinse 4 times per day   vancomycin variable dose per unstable renal function (pharmacist dosing)   Does not apply See admin instructions   sodium chloride, acetaminophen **OR** acetaminophen, alteplase, alteplase, anticoagulant sodium citrate, artificial tears, clonazePAM,  fentaNYL (SUBLIMAZE) injection, heparin, heparin, ipratropium-albuterol, lidocaine (PF), lidocaine-prilocaine, loperamide HCl, magnesium hydroxide, ondansetron **OR** ondansetron (ZOFRAN) IV, mouth rinse, pentafluoroprop-tetrafluoroeth, sennosides, traZODone  Assessment/ Plan:  Ms. Skarlette Wietecha is a 72 y.o.  female with past medical history of diabetes, COPD, GERD, hypertension. OA, a fib, and end stage renal disease on hemodialysis. Patient presents to the ED from her nursing facility complaining of altered mental status. She has been admitted for Lower urinary tract infectious disease [N39.0] Altered mental status, unspecified altered mental status type [R41.82] Sepsis due to gram-negative UTI (HCC) [A41.50, N39.0] Sepsis, due to unspecified organism, unspecified whether acute organ dysfunction present (HCC) [A41.9]  [facility]/TTS/Rt Permcath/81.3kg  End stage renal disease -Hemodialysis via femoral temp cath TTS schedule. -PermCath placement deferred until WBC count normalizes and infections are cleared.  2. Hypotension due to sepsis. Blood cultures and dialysis cath tip cultures with MRSA.  Repeat cultures with no growth. However now with significant leukocytosis.   - requiring vasopressors: norepinephrine.   - continue antibiotics: vancomycin and Unasyn.  Pharmacy assisting with dosing. - Appreciate ID input.   3. Anemia of chronic kidney disease : PRBC transfusion on 8/26. Hemoglobin 7.9  - EPO with HD treatments  4. Secondary Hyperparathyroidism:  with hypophosphatemia.   - continue to replace phosphorus.   - holding binders.      LOS: 8 Alexandra Foster 9/1/20248:33 AM

## 2022-12-14 NOTE — Progress Notes (Signed)
Received patient at bedside in ICU.  Alert and oriented.  Informed consent signed and in chart.   TX duration: 3 Hours  Patient tolerated well.  Patient resting comfortably, stable condition Alert, without acute distress.  Hand-off given to patient's nurse.   Access used: Right Femoral HD Catheter Access issues: Initial issue with access flow; symptom stabilized with multiple interventions; unable to achieve maximum BFR at 400 ml/min.  Total UF removed: 0 Medication(s) given: None Post HD VS: please see Data Insert Post HD weight: Unable to obtain    12/14/22 0400  Vitals  Temp 98.2 F (36.8 C)  Temp Source Axillary  BP (!) 109/56  MAP (mmHg) 72  BP Location Left Arm  BP Method Automatic  Patient Position (if appropriate) Lying  Pulse Rate 85  Pulse Rate Source Monitor  ECG Heart Rate 85  Resp (!) 28  Oxygen Therapy  SpO2 100 %  O2 Device Room Air  Patient Activity (if Appropriate) In bed  Pulse Oximetry Type Continuous  Note  Patient Observations Patient awakened from sleep, responsive to verbal cues, no c/o voiced, no acute distress noted, conditon stable.  Hemodialysis Catheter Right Femoral vein Triple lumen Temporary (Non-Tunneled)  Placement Date/Time: 12/11/22 1210   Time Out: Correct patient;Correct site;Correct procedure  Person Inserting LDA: Leanord Asal, NP  Orientation: Right  Access Location: Femoral vein  Hemodialysis Catheter Type: Triple lumen Temporary (Non-Tunneled)  Site Condition No complications  Blue Lumen Status Flushed;Heparin locked;Dead end cap in place  Red Lumen Status Flushed;Heparin locked;Dead end cap in place  Purple Lumen Status Infusing  Catheter fill solution Heparin 1000 units/ml  Catheter fill volume (Arterial) 1.8 cc  Catheter fill volume (Venous) 1.8  Dressing Type Transparent  Dressing Status Antimicrobial disc in place;Clean, Dry, Intact  Interventions New dressing;Dressing changed;Antimicrobial disc changed  Drainage  Description None  Dressing Change Due 12/21/22  Post treatment catheter status Capped and Clamped      [facility]

## 2022-12-14 NOTE — Progress Notes (Signed)
NAME:  Alexandra Foster, MRN:  [id number], DOB:  27-Sep-1950, LOS: 8 ADMISSION DATE:  12/05/2022, CONSULTATION DATE:  12/06/2022 REFERRING MD:  Dr. Wynelle Link, CHIEF COMPLAINT:  Hypotension   Brief Pt Description / Synopsis:  72 y.o female with PMHx significant for ESRD on HD admitted with Acute Metabolic Encephalopathy in setting of Severe Sepsis with Septic Shock due to MRSA Bacteremia from infected right chest Permcath, along with Enterococcus Faecalis UTI.  History of Present Illness:  Alexandra Foster is a 72 y.o. female with past medical history significant for asthma, COPD, type diabetes mellitus, s/p G-tube, GERD, ESRD on HD, hypertension, osteoarthritis, paroxysmal atrial fibrillation and OSA on CPAP, who presented to [facility] ED on 12/05/22 from [facility] due to altered mental status with lethargy and decreased responsiveness.  There was a concern about aspiration pneumonia at his facility as the patient had an episode of nausea and vomiting and was reportedly coughing per her sister without wheezing or dyspnea or hypoxia.  The patient's temperature was 102.2 when EMS arrived to this facility.  The patient was not responding to questions throughout transport.  EMS noted the patient having slurred speech and significant weakness per family (family had not seen her since last week).  Code stroke was called upon arrival to the ED. teleneurology evaluated the patient, was not considered a candidate for thrombolytics given unclear last known well time, along with low suspicion for stroke given significantly global symptoms and multiple metabolic derangements and sepsis contributing to encephalopathy.  ED Course: Initial Vital Signs: Temperature 101.4 F orally, pulse 78, respiratory rate 19, blood pressure 125/70, SpO2 99% on room air Significant Labs: hyponatremia 128 and hypochloremia of 90 with blood glucose of 197, BUN of 67 and creatinine 4.53 with CO2 of 22 and anion gap 16. Alk phos was  157 and albumin 3.3, AST 66 and ALT 87 with total obtained of 8.6. Lactic acid was 2.3 and later 2.9. CBC showed leukocytosis of 21.4 with neutrophilia and anemia. Respiratory panel including influenza A, B, RSV and COVID-19 PCR came back negative. UA was positive for UTI alcohol level was less than 10. Urine drug screen was positive for tricyclic's.  Imaging Chest X-ray>>IMPRESSION: Mild vascular congestion likely related to volume overload. CT Head w/o contrast>>IMPRESSION: 1. No acute intracranial abnormality. 2. ASPECTS is 10. Medications Administered: IV Rocephin and Zithromax as well as 3 L bolus of IV normal saline   Hospitalist were asked to admit for further workup and treatment.  Nephrology consulted for hemodialysis.  Please see "significant hospital events" section below for full detailed hospital course.  Pertinent  Medical History   Past Medical History:  Diagnosis Date   AKI (acute kidney injury) (HCC)    a. 04/2021 in setting of bacteremia/shock.   Arthritis    Asthma    Bacteremia    a. 04/2021 S pyogenes bacteremia in setting of lower ext cellulitis.   COPD (chronic obstructive pulmonary disease) (HCC)    Diabetes mellitus without complication (HCC)    Endometriosis    GERD (gastroesophageal reflux disease)    History of echocardiogram    a. 07/2013 Echo: EF 55-60%, impaired relaxation, mild TR; b. 04/2021 Echo: EF 50-55%, mild LVH, nl RV fxn, mild BAE, Ao sclerosis w/o stenosis.   Hypertension    Obesity    PAF (paroxysmal atrial fibrillation) (HCC)    a. 04/2021 in setting of septic shock/cellulitis.   Sleep apnea    CPAP    Micro Data:  8/23:  SARS-CoV-2/RSV/Flu PCR>>negative 8/23: Blood cultures>> 4/4 bottles with MRSA & Staph epidermidis 8/23: MRSA PCR>> positive 8/23: Urine>> Enterococcus Faecalis 8/24: Permcath tip>> MRSA 8/25: Repeat Blood cultures>> no growth 8/30: Cdiff>>negative  8/31: GI Panel by PCR>>negative   Antimicrobials:    Anti-infectives (From admission, onward)    Start     Dose/Rate Route Frequency Ordered Stop   12/13/22 1800  vancomycin (VANCOCIN) IVPB 1000 mg/200 mL premix        1,000 mg 200 mL/hr over 60 Minutes Intravenous  Once 12/13/22 1158 12/13/22 1820   12/11/22 2345  Ampicillin-Sulbactam (UNASYN) 3 g in sodium chloride 0.9 % 100 mL IVPB        3 g 200 mL/hr over 30 Minutes Intravenous Every 12 hours 12/11/22 2252     12/11/22 1800  vancomycin (VANCOCIN) IVPB 1000 mg/200 mL premix        1,000 mg 200 mL/hr over 60 Minutes Intravenous  Once 12/11/22 1025 12/12/22 0635   12/09/22 1800  vancomycin (VANCOREADY) IVPB 750 mg/150 mL        750 mg 150 mL/hr over 60 Minutes Intravenous  Once 12/09/22 1423 12/09/22 1848   12/08/22 1040  vancomycin variable dose per unstable renal function (pharmacist dosing)         Does not apply See admin instructions 12/08/22 1041     12/07/22 1400  vancomycin (VANCOCIN) IVPB 1000 mg/200 mL premix  Status:  Discontinued        1,000 mg 200 mL/hr over 60 Minutes Intravenous Every 24 hours 12/06/22 1234 12/08/22 1041   12/06/22 2200  cefTRIAXone (ROCEPHIN) 2 g in sodium chloride 0.9 % 100 mL IVPB  Status:  Discontinued        2 g 200 mL/hr over 30 Minutes Intravenous Every 24 hours 12/06/22 0132 12/06/22 1223   12/06/22 1500  ceFEPIme (MAXIPIME) 2 g in sodium chloride 0.9 % 100 mL IVPB  Status:  Discontinued        2 g 200 mL/hr over 30 Minutes Intravenous Every 12 hours 12/06/22 1252 12/08/22 1040   12/06/22 1315  vancomycin (VANCOREADY) IVPB 1750 mg/350 mL        1,750 mg 175 mL/hr over 120 Minutes Intravenous  Once 12/06/22 1224 12/06/22 1724   12/05/22 2130  cefTRIAXone (ROCEPHIN) 2 g in sodium chloride 0.9 % 100 mL IVPB  Status:  Discontinued        2 g 200 mL/hr over 30 Minutes Intravenous Every 24 hours 12/05/22 2115 12/06/22 0202   12/05/22 2130  azithromycin (ZITHROMAX) 500 mg in sodium chloride 0.9 % 250 mL IVPB  Status:  Discontinued        500  mg 250 mL/hr over 60 Minutes Intravenous Every 24 hours 12/05/22 2115 12/06/22 1223        Significant Hospital Events: Including procedures, antibiotic start and stop dates in addition to other pertinent events   8/23: Presented from [facility] due to AMS, Code Stroke called due to slurred speech and weakness. Deemed not a candidate for thrombolytics due to no clear last known normal time.  Admitted by [facility] with Nephrology consultation for dialysis. 8/24: Rapid response called due to Hypotension prior to/during Hemodialysis.  Transfer to ICU for peripheral vasopressors and initiation of CRRT.  PCCM consulted to assist with vasopressors.  Blood cultures + for MRSA & Staph Epi. ID consulted. Vascular Surgery consulted, permcath removed.  Temporary HD catheter placed. 8/24: this morning with pronounce left facial droop and dysarthria, remains on CRRT  on Levo @8  keep MAP>55 12/08/22: patient with multispecies sepsis, removing central line today due to bacteremia. Holding HD as per nephrology team due to shock.  12/09/22-patient with loose watery BM today, she does have skin breakdown over abd pannus.  She had re-initiation of her psychiatric medications today.  S/p pRBC transfusion overnight. Electrolytes are being repleted.  On room air but requires levophed still.  12/10/22- patient laying in bed with no acute distress.  Levophed weaned to 83mcg/kg/min, afebrile on room air.  Handling NGT feeds well.  Purewick with no UOP this am. 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  12/11/22- patient off levophed , requires HD today, will place catheter per renal team.  Wound care to see for anal wound.  With episode of emesis during HD and concern for possible aspiration. 12/12/22- Back on low dose Levophed, weaning as able. Worsening Leukocytosis, ID plans to check for C.diff.  Ongoing discussions of when to remove newly placed temporary HD catheter. 12/13/22- Remains on levophed gtt despite scheduled  midodrine and droxidopa.  HD session completed 12/14/22- No acute events overnight pt remains on levophed gtt   Interim History / Subjective:  Pt remains on levophed gtt currently requiring 8 mcg/min to maintain map goal   Objective   Blood pressure (!) 112/51, pulse 89, temperature 98.2 F (36.8 C), temperature source Axillary, resp. rate (!) 25, height 5\' 4"  (1.626 m), weight 93.4 kg, SpO2 95%.        Intake/Output Summary (Last 24 hours) at 12/14/2022 0657 Last data filed at 12/14/2022 4270 Gross per 24 hour  Intake 2309.31 ml  Output 1700 ml  Net 609.31 ml   Filed Weights   12/12/22 0440 12/13/22 0445 12/14/22 0500  Weight: 91.3 kg 90.5 kg 93.4 kg    Examination: General: Acute on chronically ill-appearing obese female, laying in bed, on room air, no acute distress HENT: Atraumatic, normocephalic, neck supple, difficult to assess JVD due to body habitus Lungs: Diminished breath sounds throughout, even, non labored Cardiovascular: NSR, s1s2, no m/r/g, 2+ radial/1+ distal pulses, 1+ lower extremity edema  Abdomen: +BS x4, obese, soft, non tender, non distended Extremities: Prior left AKA Neuro: Alert, disoriented to time, following commands, moves extremities to command (mild to moderate dysarthria, left facial droop which family reports is baseline) GU: Purewick in place   Resolved Hospital Problem list     Assessment & Plan:   #Septic Shock #Chronic Hypotension (on midodrine outpatient) PMHx: Paroxsymal Atrial Fibrillation, HTN Echocardiogram 12/07/2022: LVEF 60-65%, RV systolic function low normal, RV size mildly enlarged, trivial mitral regurgitation - Continuous cardiac monitoring - Maintain MAP >55 per Nephrology - Vasopressors as needed to maintain MAP goal - Continue Midodrine & Droxidopa - Lactic acid is normalized - Cortisol is 35.9 - TSH within normal limits   - Continue outpatient amiodarone  #Severe Sepsis due to MRSA BACTEREMIA due to infected Permcath &  Enterococcus Faecalis UTI #Suspected Aspiration Permcath was removed on 8/24 2D Echocardiogram without evidence of vegetations - Trend WBC and monitor fever curve  - Trend PCT  - Follow cultures  -ID following appreciate input ~continue unasyn & vancomycin pending cultures & sensitivities - ID recommends TEE ~ but pt is extremely high risk given hypotension, would be difficult to wean from ventilatory ~ thus recommends 6 weeks of Vancomycin since unable to undergo TEE - Ongoing discussions of when to remove newly placed Temporary HD catheter (placed on 8/29)  #ESRD on Hemodialysis #Mild Hyponatremia - Trend BMP  -  Ensure adequate renal perfusion - Avoid nephrotoxic agents as able - Monitor I&O's / urinary output - Replace electrolytes as indicated ~ Pharmacy following for assistance with electrolyte replacement - Nephrology following, appreciate input: HD per recommendations  #COPD without acute exacerbation #Concern for Aspiration #OSA, uses CPAP at home - Supplemental O2 as needed to maintain O2 sats 88 to 92% - CPAP qhs - Follow intermittent Chest X-ray & ABG as needed - Bronchodilators prn - Pulmonary toilet as able - ABX as above  #Anemia of chronic kidney disease - Trend CBC - Monitor for s/sx of bleeding  - VTE prophylaxis: subcutaneous heparin  - Transfuse for Hgb <7  #Diarrhea Cdiff 08/30: negative  - Continue rectal tube  - Prn imodium  #Type 2 diabetes mellitus - CBG's q4h; Target range of 140 to 180 - SSI - Follow ICU Hypo/Hyperglycemia protocol - Hold outpatient metformin  #Acute metabolic encephalopathy in setting of sepsis and multiple metabolic derangements~improving  #? TIA (left sided weakness, left facial droop) PMHx: Depression and Anxiety CT Head 8/23 negative for acute intracranial abnormality Code stroke called 8/23, deemed not a candidate for thrombolytics given unclear last known well time CTA head and neck reviewed and shows no acute  intracranial abnormality or LVO -Evaluated by inpatient Neurology on 8/25 ~ Recommendations included: "-Appreciate neurosurgery confirmation that anticoagulation can be resumed from their perspective, secure chat sent to neurosurgeon on-call -Echocardiogram pending, may need TEE if TTE is negative to definitively exclude endocarditis (defer to primary team, cardiology and infectious disease on risk/benefit) -Anticoagulation for stroke risk prevention once endocarditis is ruled out or resolved, and after neurosurgical clearance -LDL goal less than 70, A1c goal less than 7% (A1c and lipid panel ordered), please adjust medications as needed -Carotid ultrasound -Outpatient follow-up for OSA/CPAP versus inpatient trial as this can increase stroke risk if untreated -Outpatient follow-up for pancreatic lesion as malignancy can increase stroke risk -Outpatient follow-up for IVC filter, could potentially be removed if anticoagulation is reinstituted" - Treatment of sepsis and metabolic derangements as outlined above - Provide supportive care - Promote normal sleep/wake cycle and family presence - Avoid sedating medications as able - Continue outpatient hydroxyzine, lexapro, and prn klonopin - PT/OT   Patient is critically ill with bacteremia and shock.  Prognosis is guarded, high risk for further decompensation, cardiac arrest and death.  Given current illness superimposed on advanced age and multiple chronic comorbidities, overall long-term prognosis is poor.  Recommend DNR status.  Palliative Care was consulted but has since signed off as pt and family very clear they wish for full code and all aggressive measures. Best Practice (right click and "Reselect all SmartList Selections" daily)   Diet/type: NPO, tube feeds DVT prophylaxis: prophylactic heparin  GI prophylaxis: N/A Lines: Right femoral trialysis and is still needed Foley:  N/A Code Status:  full code Last date of multidisciplinary goals of  care discussion [12/14/22]  9/01: Will update pts daughter when she arrives at bedside   Labs   CBC: Recent Labs  Lab 12/10/22 0551 12/11/22 0557 12/12/22 0512 12/13/22 0428 12/14/22 0440  WBC 38.8* 36.8* 49.8* 37.5* 40.2*  HGB 7.4* 8.0* 8.8* 7.9* 7.9*  HCT 23.1* 25.3* 28.5* 25.5* 25.3*  MCV 85.6 86.9 87.4 90.1 87.5  PLT 244 301 280 351 328    Basic Metabolic Panel: Recent Labs  Lab 12/08/22 0613 12/09/22 0417 12/09/22 1256 12/10/22 0551 12/11/22 0557 12/12/22 0512 12/13/22 0428 12/14/22 0440  NA 132* 132*  --  135 139 138  132* 136  K 3.7 2.9*   < > 3.5 2.9* 3.9 3.4* 3.6  CL 102 102  --  103 105 96* 96* 98  CO2 20* 20*  --  20* 23 25 24 27   GLUCOSE 184* 191*  --  179* 132* 90 172* 160*  BUN 38* 57*  --  89* 99* 67* 90* 43*  CREATININE 2.57* 2.88*  --  3.31* 3.57* 2.42* 3.09* 1.72*  CALCIUM 8.2* 8.4*  --  8.6* 8.9 8.9 8.4* 8.2*  MG 2.2 2.4  --   --  2.2 1.9  --  1.5*  PHOS 1.7* 1.7*   < > 1.1* 1.7* 2.0* 3.6 2.3*   < > = values in this interval not displayed.   GFR: Estimated Creatinine Clearance: 31.8 mL/min (A) (by C-G formula based on SCr of 1.72 mg/dL (H)). Recent Labs  Lab 12/08/22 0525 12/08/22 0981 12/09/22 0417 12/10/22 0551 12/11/22 0557 12/12/22 0512 12/13/22 0428 12/14/22 0440  PROCALCITON 4.43  --  4.32  --   --   --   --  3.18  WBC 34.6*   < > 33.7*   < > 36.8* 49.8* 37.5* 40.2*   < > = values in this interval not displayed.    Liver Function Tests: Recent Labs  Lab 12/10/22 0551 12/11/22 0557 12/12/22 0512 12/13/22 0428 12/14/22 0440  ALBUMIN 2.2* 2.1* 2.2* 2.1* 2.1*   No results for input(s): "LIPASE", "AMYLASE" in the last 168 hours. No results for input(s): "AMMONIA" in the last 168 hours.  ABG    Component Value Date/Time   PHART 7.40 05/14/2021 0341   PCO2ART 45 05/14/2021 0341   PO2ART 129 (H) 05/14/2021 0341   HCO3 27.9 05/14/2021 0341   ACIDBASEDEF 12.8 (H) 05/11/2021 0603   O2SAT 98.9 05/14/2021 0341      Coagulation Profile: No results for input(s): "INR", "PROTIME" in the last 168 hours.   Cardiac Enzymes: No results for input(s): "CKTOTAL", "CKMB", "CKMBINDEX", "TROPONINI" in the last 168 hours.  HbA1C: Hgb A1c MFr Bld  Date/Time Value Ref Range Status  12/07/2022 02:13 AM 5.2 4.8 - 5.6 % Final    Comment:    (NOTE) Pre diabetes:          5.7%-6.4%  Diabetes:              >6.4%  Glycemic control for   <7.0% adults with diabetes   05/05/2021 04:53 AM 6.7 (H) 4.8 - 5.6 % Final    Comment:    (NOTE)         Prediabetes: 5.7 - 6.4         Diabetes: >6.4         Glycemic control for adults with diabetes: <7.0     CBG: Recent Labs  Lab 12/13/22 1200 12/13/22 1610 12/13/22 2002 12/13/22 2346 12/14/22 0411  GLUCAP 157* 126* 142* 155* 148*    Review of Systems:   Positives in BOLD: Gen: Denies fever, chills, weight change, fatigue, night sweats HEENT: Denies blurred vision, double vision, hearing loss, tinnitus, sinus congestion, rhinorrhea, sore throat, neck stiffness, dysphagia PULM: Denies shortness of breath, cough, sputum production, hemoptysis, wheezing CV: Denies chest pain, edema, orthopnea, paroxysmal nocturnal dyspnea, palpitations GI: Denies abdominal pain, nausea, vomiting, diarrhea, hematochezia, melena, constipation, change in bowel habits GU: Denies dysuria, hematuria, polyuria, oliguria, urethral discharge Endocrine: Denies hot or cold intolerance, polyuria, polyphagia or appetite change Derm: Denies rash, dry skin, scaling or peeling skin change Heme: Denies easy bruising, bleeding,  bleeding gums Neuro: Denies headache, numbness, weakness, slurred speech, loss of memory or consciousness   Past Medical History:  She,  has a past medical history of AKI (acute kidney injury) (HCC), Arthritis, Asthma, Bacteremia, COPD (chronic obstructive pulmonary disease) (HCC), Diabetes mellitus without complication (HCC), Endometriosis, GERD (gastroesophageal  reflux disease), History of echocardiogram, Hypertension, Obesity, PAF (paroxysmal atrial fibrillation) (HCC), and Sleep apnea.   Surgical History:   Past Surgical History:  Procedure Laterality Date   ABDOMINAL HYSTERECTOMY     APPLICATION OF WOUND VAC Left 05/17/2021   Procedure: APPLICATION OF WOUND VAC/WOUND VAC EXCHANGE-Matrix Myriad;  Surgeon: Carolan Shiver, MD;  Location: [facility];  Service: General;  Laterality: Left;   APPLICATION OF WOUND VAC  05/24/2021   Procedure: APPLICATION OF WOUND VAC;  Surgeon: Carolan Shiver, MD;  Location: [facility];  Service: General;;   APPLICATION OF WOUND VAC  05/10/2021   Procedure: APPLICATION OF WOUND VAC;  Surgeon: Carolan Shiver, MD;  Location: [facility];  Service: General;;   COLONOSCOPY  10/29/2006   Dr Servando Snare   COLONOSCOPY WITH PROPOFOL N/A 11/05/2016   Procedure: COLONOSCOPY WITH PROPOFOL;  Surgeon: Earline Mayotte, MD;  Location: [facility];  Service: Endoscopy;  Laterality: N/A;   INCISION AND DRAINAGE OF WOUND Left 05/24/2021   Procedure: IRRIGATION AND DEBRIDEMENT LEFT LEG;  Surgeon: Carolan Shiver, MD;  Location: [facility];  Service: General;  Laterality: Left;   INCISION AND DRAINAGE OF WOUND Left 05/10/2021   Procedure: IRRIGATION AND DEBRIDEMENT LEFT LEG;  Surgeon: Carolan Shiver, MD;  Location: [facility];  Service: General;  Laterality: Left;   IR FLUORO GUIDE CV LINE RIGHT  06/12/2021   IR PERC TUN PERIT CATH WO PORT S&I /IMAG  06/12/2021   IR REPLC GASTRO/COLONIC TUBE PERCUT W/FLUORO  06/12/2021   IVC FILTER INSERTION N/A 05/29/2021   Procedure: IVC FILTER INSERTION;  Surgeon: Renford Dills, MD;  Location: [facility];  Service: Cardiovascular;  Laterality: N/A;   MINOR GRAFT APPLICATION  05/24/2021   Procedure: Myriad Matrix  APPLICATION;  Surgeon: Carolan Shiver, MD;  Location: [facility];  Service: General;;   NASAL SINUS SURGERY  2002   Dr Chestine Spore   PEG PLACEMENT N/A 05/28/2021    Procedure: PERCUTANEOUS ENDOSCOPIC GASTROSTOMY (PEG) PLACEMENT;  Surgeon: Sung Amabile, DO;  Location: [facility];  Service: General;  Laterality: N/A;  TRAVEL CASE   TRACHEOSTOMY TUBE PLACEMENT N/A 05/17/2021   Procedure: TRACHEOSTOMY;  Surgeon: Geanie Logan, MD;  Location: [facility];  Service: ENT;  Laterality: N/A;   WOUND DEBRIDEMENT Left 05/07/2021   Procedure: DEBRIDEMENT WOUND;  Surgeon: Carolan Shiver, MD;  Location: [facility];  Service: General;  Laterality: Left;     Social History:   reports that she quit smoking about 21 years ago. Her smoking use included cigarettes. She started smoking about 61 years ago. She has a 80 pack-year smoking history. She quit smokeless tobacco use about 21 years ago.  Her smokeless tobacco use included snuff. She reports that she does not drink alcohol and does not use drugs.   Family History:  Her family history includes Alcohol abuse in her brother; Breast cancer (age of onset: 27) in her sister; Congestive Heart Failure in her mother; Coronary artery disease (age of onset: 4) in her father; Heart disease in her brother; Varicose Veins in her brother.   Allergies No Known Allergies   Home Medications  Prior to Admission medications   Medication Sig Start Date End Date Taking? Authorizing Provider  amiodarone (PACERONE) 200 MG tablet Place 1 tablet (200 mg total) into feeding tube 2 (two) times daily. 06/13/21  Yes Erin Fulling, MD  ascorbic acid (VITAMIN C) 500 MG tablet Place 1 tablet (500 mg total) into feeding tube 2 (two) times daily. 06/13/21  Yes Kasa, Wallis Bamberg, MD  busPIRone (BUSPAR) 5 MG tablet Take 5 mg by mouth 2 (two) times daily. 12/04/22  Yes [provider]  cholecalciferol (VITAMIN D) 25 MCG tablet Place 2 tablets (2,000 Units total) into feeding tube daily. 06/14/21  Yes Kasa, Wallis Bamberg, MD  clonazePAM (KLONOPIN) 0.5 MG tablet Take 0.25-0.5 mg by mouth 2 (two) times daily. 0.25 mg every morning and 0.5 mg at bedtime for Agitation  11/18/22  Yes [provider]  escitalopram (LEXAPRO) 10 MG tablet Place 1 tablet (10 mg total) into feeding tube daily. 06/14/21  Yes Erin Fulling, MD  hydrOXYzine (ATARAX) 50 MG tablet Take 100 mg by mouth 2 (two) times daily. 12/02/22  Yes [provider]  lip balm (BLISTEX) OINT Apply 1 application topically 3 (three) times daily. 06/13/21  Yes Erin Fulling, MD  loperamide (IMODIUM) 2 MG capsule Take 1 capsule (2 mg total) by mouth as needed for diarrhea or loose stools. 06/13/21  Yes Erin Fulling, MD  midodrine (PROAMATINE) 5 MG tablet Place 3 tablets (15 mg total) into feeding tube 3 (three) times daily with meals. 06/13/21  Yes Erin Fulling, MD  multivitamin (RENA-VIT) TABS tablet Place 1 tablet into feeding tube at bedtime. 06/13/21  Yes Erin Fulling, MD  omeprazole (PRILOSEC) 20 MG capsule Take 20 mg by mouth daily. 11/12/22  Yes [provider]  sennosides (SENOKOT) 8.8 MG/5ML syrup Place 10 mLs into feeding tube at bedtime as needed for moderate constipation. 06/13/21  Yes Erin Fulling, MD  artificial tears (LACRILUBE) OINT ophthalmic ointment Place into both eyes every 4 (four) hours as needed for dry eyes. Patient not taking: Reported on 12/06/2022 06/13/21   Erin Fulling, MD  chlorhexidine gluconate, MEDLINE KIT, (PERIDEX) 0.12 % solution 15 mLs by Mouth Rinse route 2 (two) times daily. Patient not taking: Reported on 12/06/2022 06/13/21   Erin Fulling, MD  collagenase (SANTYL) ointment Apply topically daily. Patient not taking: Reported on 12/06/2022 06/14/21   Erin Fulling, MD  dextrose 5 % SOLN 100 mL with copper chloride 0.4 MG/ML SOLN 2 mg Inject 2 mg into the vein daily. 06/14/21   Erin Fulling, MD  ipratropium-albuterol (DUONEB) 0.5-2.5 (3) MG/3ML SOLN Take 3 mLs by nebulization 3 (three) times daily. Patient not taking: Reported on 12/06/2022 06/13/21   Erin Fulling, MD  lactobacillus (FLORANEX/LACTINEX) PACK Place 1 packet (1 g total) into feeding tube 3 (three) times daily with  meals. Patient not taking: Reported on 12/06/2022 06/13/21   Erin Fulling, MD  pantoprazole sodium (PROTONIX) 40 mg Place 40 mg into feeding tube daily. Patient not taking: Reported on 12/06/2022 06/13/21   Erin Fulling, MD  Scheduled Meds:  amiodarone  200 mg Per Tube BID   ascorbic acid  500 mg Per Tube BID   atorvastatin  40 mg Per Tube Daily   chlorhexidine  15 mL Mouth Rinse BID   Chlorhexidine Gluconate Cloth  6 each Topical Q0600   vitamin D3  2,000 Units Per Tube Daily   [START ON 12/18/2022] copper  2 mg Per Tube Daily   droxidopa  200 mg Per Tube TID WC   epoetin (EPOGEN/PROCRIT) injection  10,000 Units Intravenous Q T,Th,Sa-HD   escitalopram  10 mg Per  Tube Daily   famotidine  10 mg Per Tube Daily   feeding supplement (NEPRO CARB STEADY)  1,000 mL Per Tube Q24H   feeding supplement (PROSource TF20)  60 mL Per Tube Daily   free water  30 mL Per Tube Q4H   Gerhardt's butt cream   Topical BID   heparin injection (subcutaneous)  5,000 Units Subcutaneous Q8H   hydrOXYzine  50 mg Per Tube Q6H   insulin aspart  0-6 Units Subcutaneous Q4H   midodrine  15 mg Per Tube TID WC   multivitamin  1 tablet Per Tube QHS   mupirocin ointment   Nasal BID   nutrition supplement (JUVEN)  1 packet Per Tube BID BM   mouth rinse  15 mL Mouth Rinse 4 times per day   vancomycin variable dose per unstable renal function (pharmacist dosing)   Does not apply See admin instructions   Continuous Infusions:  sodium chloride Stopped (12/07/22 0210)   sodium chloride Stopped (12/08/22 1823)   ampicillin-sulbactam (UNASYN) IV Stopped (12/14/22 0008)   anticoagulant sodium citrate     copper chloride 2 mg in dextrose 5 % 100 mL IVPB Stopped (12/13/22 1249)   magnesium sulfate bolus IVPB 4 g (12/14/22 0636)   norepinephrine (LEVOPHED) Adult infusion 10 mcg/min (12/14/22 0614)   PRN Meds:.sodium chloride, acetaminophen **OR** acetaminophen, alteplase, alteplase, anticoagulant sodium citrate, artificial tears,  clonazePAM, fentaNYL (SUBLIMAZE) injection, heparin, heparin, ipratropium-albuterol, lidocaine (PF), lidocaine-prilocaine, loperamide HCl, magnesium hydroxide, ondansetron **OR** ondansetron (ZOFRAN) IV, mouth rinse, pentafluoroprop-tetrafluoroeth, sennosides, traZODone   Critical care time: 35 minutes    Zada Girt, AGNP  Pulmonary/Critical Care Pager [phone] (please enter 7 digits) PCCM Consult Pager [phone] (please enter 7 digits)

## 2022-12-14 NOTE — Progress Notes (Signed)
OT Cancellation Note  Patient Details Name: Alexandra Foster MRN: [id number] DOB: 06-08-1950   Cancelled Treatment:    Reason Eval/Treat Not Completed: Fatigue/lethargy limiting ability to participate (Pt very minimally participatory with PT this morning; lethargic; will hold until tomorrow). OT to assess tomorrow or when pt more alert and participatory.  Alvester Morin 12/14/2022, 2:02 PM

## 2022-12-14 NOTE — Evaluation (Signed)
Physical Therapy Evaluation Patient Details Name: Alexandra Foster MRN: [id number] DOB: Oct 14, 1950 Today's Date: 12/14/2022  History of Present Illness  72 y.o female with PMHx significant for ESRD on HD, L AKA admitted with Acute Metabolic Encephalopathy in setting of Severe Sepsis with Septic Shock due to MRSA Bacteremia from infected right chest Permcath, along with Enterococcus Faecalis UTI.  Clinical Impression  Pt lethargic but able to open eyes, answer basic background questions but only inconsistently keeping eyes open and engaging.  She was able to do some limited LE supine exercises but was inconsistent and needing AAROM or PROM and constant cuing/encouragement to actually show some effort/participate 2/2 fatigue and general lack of strength.  Pt was willing to try sitting up but ultimately needed heavy assist and could not help to maintain sitting EOB needing very heavy/total assist for ~20 seconds of sitting.  Pt displays significant functional limitations (Hoyer lift at baseline?) and will require further PT to address these issues and decrease caregiver burden.        If plan is discharge home, recommend the following:  TBD but insure Michiel Sites and w/c are indeed there   Can travel by private vehicle        Equipment Recommendations  (TBD at next venue of care)  Recommendations for Other Services       Functional Status Assessment Patient has had a recent decline in their functional status and demonstrates the ability to make significant improvements in function in a reasonable and predictable amount of time.     Precautions / Restrictions Precautions Precautions: Fall Restrictions Weight Bearing Restrictions: No      Mobility  Bed Mobility Overal bed mobility: Needs Assistance Bed Mobility: Supine to Sit, Sit to Supine     Supine to sit: Max assist Sit to supine: Max assist   General bed mobility comments: Pt able to show some minimal effort/assist with getting  to EOB but ultimately needed heavy assist to get to sitting, maintain EOB and to safety return back to supine    Transfers                   General transfer comment: unsafe to attempt standing EOB given weakness and dependence with getting to sitting    Ambulation/Gait                  Stairs            Wheelchair Mobility     Tilt Bed    Modified Rankin (Stroke Patients Only)       Balance Overall balance assessment: Needs assistance Sitting-balance support: Bilateral upper extremity supported Sitting balance-Leahy Scale: Zero Sitting balance - Comments: heavy PT assist just to sustain sitting ~20 seconds, heavy backward lean and poor ability for pt to make adjustments                                     Pertinent Vitals/Pain Pain Assessment Pain Assessment: No/denies pain    Home Living Family/patient expects to be discharged to:: Skilled nursing facility                   Additional Comments: lives with daughter, reports they have a Hoyerlift and w/c    Prior Function Prior Level of Function : Needs assist             Mobility Comments: Pt reports that she was  using Hoyer lift for transfers at home, struggled to say how much she was doing recently at rehab       Extremity/Trunk Assessment   Upper Extremity Assessment Upper Extremity Assessment: Generalized weakness    Lower Extremity Assessment Lower Extremity Assessment: Generalized weakness (lacks TKE by a few degrees on R with limited range ankle PF/DF, L high AKA, lacks SLR on R)       Communication   Communication Communication: No apparent difficulties  Cognition Arousal: Lethargic Behavior During Therapy: WFL for tasks assessed/performed Overall Cognitive Status: Difficult to assess                                          General Comments General comments (skin integrity, edema, etc.): Pt was able to tolerate some brief EOB  sitting, reported some dizziness in sitting, attempted to check BP but she could not maintain upright long enough to let it read - BPs readings have been and were consistently soft    Exercises General Exercises - Lower Extremity Ankle Circles/Pumps: AAROM, Right, 10 reps Heel Slides: AAROM, 10 reps, Right (with lightly resisted leg ext) Hip ABduction/ADduction: AAROM, PROM, 10 reps, Both Straight Leg Raises: PROM, AROM, Right, Left, 10 reps (AROM L (residual limb), AA/PROM R)   Assessment/Plan    PT Assessment Patient needs continued PT services  PT Problem List Decreased strength;Decreased range of motion;Decreased activity tolerance;Decreased mobility;Decreased balance;Decreased knowledge of use of DME;Decreased safety awareness;Cardiopulmonary status limiting activity       PT Treatment Interventions DME instruction;Functional mobility training;Therapeutic activities;Therapeutic exercise;Balance training;Neuromuscular re-education;Patient/family education;Wheelchair mobility training    PT Goals (Current goals can be found in the Care Plan section)  Acute Rehab PT Goals Patient Stated Goal: unable to state PT Goal Formulation: Patient unable to participate in goal setting Time For Goal Achievement: 12/27/22 Potential to Achieve Goals: Fair    Frequency Min 1X/week     Co-evaluation               AM-PAC PT "6 Clicks" Mobility  Outcome Measure Help needed turning from your back to your side while in a flat bed without using bedrails?: Total Help needed moving from lying on your back to sitting on the side of a flat bed without using bedrails?: Total Help needed moving to and from a bed to a chair (including a wheelchair)?: Total Help needed standing up from a chair using your arms (e.g., wheelchair or bedside chair)?: Total Help needed to walk in hospital room?: Total Help needed climbing 3-5 steps with a railing? : Total 6 Click Score: 6    End of Session    Activity Tolerance: Patient limited by lethargy;Patient limited by fatigue Patient left: with nursing/sitter in room;in bed Nurse Communication: Mobility status;Need for lift equipment PT Visit Diagnosis: Muscle weakness (generalized) (M62.81);Difficulty in walking, not elsewhere classified (R26.2)    Time: 1610-9604 PT Time Calculation (min) (ACUTE ONLY): 21 min   Charges:   PT Evaluation $PT Eval Low Complexity: 1 Low PT Treatments $Therapeutic Exercise: 8-22 mins PT General Charges $$ ACUTE PT VISIT: 1 Visit         Malachi Pro, DPT 12/14/2022, 12:24 PM

## 2022-12-14 NOTE — Plan of Care (Signed)
  Problem: Coping: Goal: Level of anxiety will decrease Outcome: Progressing   Problem: Elimination: Goal: Will not experience complications related to bowel motility Outcome: Progressing   Problem: Pain Managment: Goal: General experience of comfort will improve Outcome: Progressing   Problem: Nutritional: Goal: Maintenance of adequate nutrition will improve Outcome: Progressing

## 2022-12-14 NOTE — Progress Notes (Signed)
Updated pts daughter in the unit regarding pts condition and current plan of care.  All questions were answered.  Zada Girt, AGNP  Pulmonary/Critical Care Pager [phone] (please enter 7 digits) PCCM Consult Pager [phone] (please enter 7 digits)

## 2022-12-14 NOTE — Progress Notes (Signed)
Pharmacy Antibiotic Note  Alexandra Foster is a 72 y.o. female admitted on 12/05/2022 with bacteremia.  Bcx 4 of 4 GPC, BCID showing MRSA and Staph epidermidis. Patient started on CRRT 8/24 - stopped 8/25. Pharmacy has been consulted for vancomycin dosing and monitoring.   Today, 12/14/2022 Day # 7 vancomycin Renal: on HD prior to admission, briefly on CRRT 8/24-8/25. Her previous HD jugular catheter was removed  8/24 d/t concern for infection.  She is currently on line holiday (femoral line placed for CRRT removed 8/27). CVC inserted 8/29 and HD was performed.  Still makes some Urine Afebrile WBC 49.8 (trending up) 8/23 Bcx MRSA, repeat Bcx 8/25 NGTD 8/24 cath tip: MRSA  Date HD (Y/N) Level Plan  8/24 CRRT  Vanc 1750 mg  8/25 CRRT  Vanc 1000 mg  8/26 N 22   8/27 N  Vanc 750 mg  8/28 N    8/29 Y 22 Vanc 1000 mg  8/30 N    8/31 Y  Vanc 1000 mg  9/1 N      Plan: Per nephrology, no plan for HD 9/1. Follow-up HD schedule per nephrology. Plan for vancomycin 1 g qHD(received dose on 8/31). Still makes some urine and due to non-renal clearance may need level checked before next HD  Height: 5\' 4"  (162.6 cm) Weight: 93.4 kg (205 lb 14.6 oz) IBW/kg (Calculated) : 54.7  Temp (24hrs), Avg:98.4 F (36.9 C), Min:98.2 F (36.8 C), Max:98.7 F (37.1 C)  Recent Labs  Lab 12/08/22 1130 12/09/22 0417 12/10/22 0551 12/11/22 0557 12/12/22 0512 12/13/22 0428 12/14/22 0440  WBC  --    < > 38.8* 36.8* 49.8* 37.5* 40.2*  CREATININE  --    < > 3.31* 3.57* 2.42* 3.09* 1.72*  VANCORANDOM 22  --   --  22  --   --   --    < > = values in this interval not displayed.    Estimated Creatinine Clearance: 31.8 mL/min (A) (by C-G formula based on SCr of 1.72 mg/dL (H)).    No Known Allergies  Antimicrobials this admission: 8/23 Azithromycin / CRO x 1 8/24 Cefepime >> 8/26 8/24 Vancomycin >>   Microbiology results: 8/23 BCx: 4/4 MRSA 8/23 Cath tip: MRSA 8/24 MRSA PCR: Positive   Thank you  for allowing pharmacy to be a part of this patient's care.  Harrell Lark A Sheranda Seabrooks 12/14/2022 8:57 AM

## 2022-12-14 NOTE — Progress Notes (Signed)
PHARMACY CONSULT NOTE  Pharmacy Consult for Electrolyte Monitoring and Replacement   Recent Labs: Potassium (mmol/L)  Date Value  12/14/2022 3.6  08/11/2013 3.3 (L)   Magnesium (mg/dL)  Date Value  74/25/9563 1.5 (L)   Calcium (mg/dL)  Date Value  87/56/4332 8.2 (L)   Calcium, Total (mg/dL)  Date Value  95/18/8416 9.2   Albumin (g/dL)  Date Value  60/63/0160 2.1 (L)  08/11/2013 3.1 (L)   Phosphorus (mg/dL)  Date Value  10/93/2355 2.3 (L)   Sodium (mmol/L)  Date Value  12/14/2022 136  08/11/2013 138    Assessment: 72 y.o. female with medical history significant for asthma, COPD, type diabetes mellitus, s/p G-tube, GERD, ESRD on HD, hypertension, osteoarthritis, paroxysmal atrial fibrillation and OSA on CPAP, who presented to the emergency room with acute onset of altered mental status with lethargy and decreased responsiveness now on CRRT.  CRRT stopped 8/25. Plan for line holiday. CVC placed 8/29 and HD performed same day. Patient is on tube feeds and having diarrhea. Hypophosphatemia suspected secondary to re-feeding / diarrhea.   Goal of Therapy:  Electrolytes WNL  Plan:  --Mag 1.5: Mag sulfate 4g IV x 1 ordered by overnight CCMD --Phos 2.3: PhosNak 1 packet via tube x 1 ordered --Re-check electrolytes again tomorrow AM  Bettey Costa 12/14/2022 8:53 AM

## 2022-12-15 DIAGNOSIS — R6521 Severe sepsis with septic shock: Secondary | ICD-10-CM | POA: Diagnosis not present

## 2022-12-15 DIAGNOSIS — Z7189 Other specified counseling: Secondary | ICD-10-CM | POA: Diagnosis not present

## 2022-12-15 DIAGNOSIS — A419 Sepsis, unspecified organism: Secondary | ICD-10-CM | POA: Diagnosis not present

## 2022-12-15 LAB — CBC
HCT: 25.2 % — ABNORMAL LOW (ref 36.0–46.0)
Hemoglobin: 7.4 g/dL — ABNORMAL LOW (ref 12.0–15.0)
MCH: 26.8 pg (ref 26.0–34.0)
MCHC: 29.4 g/dL — ABNORMAL LOW (ref 30.0–36.0)
MCV: 91.3 fL (ref 80.0–100.0)
Platelets: 353 10*3/uL (ref 150–400)
RBC: 2.76 MIL/uL — ABNORMAL LOW (ref 3.87–5.11)
RDW: 22.4 % — ABNORMAL HIGH (ref 11.5–15.5)
WBC: 22.6 10*3/uL — ABNORMAL HIGH (ref 4.0–10.5)
nRBC: 2.3 % — ABNORMAL HIGH (ref 0.0–0.2)

## 2022-12-15 LAB — RENAL FUNCTION PANEL
Albumin: 1.9 g/dL — ABNORMAL LOW (ref 3.5–5.0)
Anion gap: 7 (ref 5–15)
BUN: 61 mg/dL — ABNORMAL HIGH (ref 8–23)
CO2: 29 mmol/L (ref 22–32)
Calcium: 8.1 mg/dL — ABNORMAL LOW (ref 8.9–10.3)
Chloride: 101 mmol/L (ref 98–111)
Creatinine, Ser: 2.4 mg/dL — ABNORMAL HIGH (ref 0.44–1.00)
GFR, Estimated: 21 mL/min — ABNORMAL LOW (ref >60)
Glucose, Bld: 159 mg/dL — ABNORMAL HIGH (ref 70–99)
Phosphorus: 3.9 mg/dL (ref 2.5–4.6)
Potassium: 3.4 mmol/L — ABNORMAL LOW (ref 3.5–5.1)
Sodium: 137 mmol/L (ref 135–145)

## 2022-12-15 LAB — GLUCOSE, CAPILLARY
Glucose-Capillary: 148 mg/dL — ABNORMAL HIGH (ref 70–99)
Glucose-Capillary: 166 mg/dL — ABNORMAL HIGH (ref 70–99)
Glucose-Capillary: 154 mg/dL — ABNORMAL HIGH (ref 70–99)
Glucose-Capillary: 143 mg/dL — ABNORMAL HIGH (ref 70–99)
Glucose-Capillary: 135 mg/dL — ABNORMAL HIGH (ref 70–99)

## 2022-12-15 LAB — MAGNESIUM: Magnesium: 2.7 mg/dL — ABNORMAL HIGH (ref 1.7–2.4)

## 2022-12-15 MED ORDER — DROXIDOPA 100 MG PO CAPS
300.0000 mg | ORAL_CAPSULE | Freq: Three times a day (TID) | ORAL | Status: DC
  Administered 2022-12-15 – 2022-12-27 (×35): 300 mg
  Filled 2022-12-15 (×39): qty 3

## 2022-12-15 MED ORDER — ALBUMIN HUMAN 25 % IV SOLN
25.0000 g | Freq: Once | INTRAVENOUS | Status: AC
  Administered 2022-12-15: 25 g via INTRAVENOUS
  Filled 2022-12-15: qty 100

## 2022-12-15 MED ORDER — POTASSIUM CHLORIDE 20 MEQ PO PACK
20.0000 meq | PACK | Freq: Once | ORAL | Status: AC
  Administered 2022-12-15: 20 meq
  Filled 2022-12-15: qty 1

## 2022-12-15 NOTE — Progress Notes (Signed)
Date of Admission:  12/05/2022     ID: Alexandra Foster is a 72 y.o. female  Principal Problem:   Septic shock (HCC) Active Problems:   MRSA bacteremia   Acute metabolic encephalopathy   Paroxysmal atrial fibrillation (HCC)   Anxiety and depression   Type 2 diabetes mellitus with chronic kidney disease, with long-term current use of insulin (HCC)   ESRD on hemodialysis (HCC)    Subjective: Pt has some itching Nurse tech says she has had it for a few days  Medications:   amiodarone  200 mg Per Tube BID   ascorbic acid  500 mg Per Tube BID   atorvastatin  40 mg Per Tube Daily   chlorhexidine  15 mL Mouth Rinse BID   Chlorhexidine Gluconate Cloth  6 each Topical Q0600   vitamin D3  2,000 Units Per Tube Daily   [START ON 12/18/2022] copper  2 mg Per Tube Daily   droxidopa  300 mg Per Tube TID WC   epoetin (EPOGEN/PROCRIT) injection  10,000 Units Intravenous Q T,Th,Sa-HD   escitalopram  10 mg Per Tube Daily   famotidine  10 mg Per Tube Daily   feeding supplement (NEPRO CARB STEADY)  1,000 mL Per Tube Q24H   feeding supplement (PROSource TF20)  60 mL Per Tube Daily   fludrocortisone  0.2 mg Per Tube Daily   free water  30 mL Per Tube Q4H   Gerhardt's butt cream   Topical BID   heparin injection (subcutaneous)  5,000 Units Subcutaneous Q8H   hydrOXYzine  50 mg Per Tube Q6H   insulin aspart  0-6 Units Subcutaneous Q4H   midodrine  15 mg Per Tube TID WC   multivitamin  1 tablet Per Tube QHS   mupirocin ointment   Nasal BID   nutrition supplement (JUVEN)  1 packet Per Tube BID BM   mouth rinse  15 mL Mouth Rinse 4 times per day   vancomycin variable dose per unstable renal function (pharmacist dosing)   Does not apply See admin instructions    Objective: Vital signs in last 24 hours: Patient Vitals for the past 24 hrs:  BP Temp Temp src Pulse Resp SpO2 Weight  12/15/22 1730 (!) 109/44 -- -- 65 20 100 % --  12/15/22 1630 (!) 81/46 -- -- 62 19 99 % --  12/15/22 1610 -- --  -- 65 17 98 % --  12/15/22 1600 (!) 84/59 97.7 F (36.5 C) Oral (!) 41 15 100 % --  12/15/22 1530 (!) 88/43 -- -- 61 16 97 % --  12/15/22 1430 (!) 95/42 -- -- (!) 59 19 99 % --  12/15/22 1400 (!) 99/43 -- -- (!) 58 18 98 % --  12/15/22 1330 (!) 119/103 -- -- 61 18 98 % --  12/15/22 1300 (!) 97/44 -- -- 62 20 98 % --  12/15/22 1230 (!) 119/54 -- -- 66 20 99 % --  12/15/22 1200 (!) 101/47 98.2 F (36.8 C) Oral 61 20 98 % --  12/15/22 1130 (!) 104/46 -- -- 62 18 98 % --  12/15/22 1105 (!) 100/42 -- -- 62 19 98 % --  12/15/22 1100 (!) 71/33 -- -- 62 (!) 21 98 % --  12/15/22 1030 (!) 108/43 -- -- 62 18 97 % --  12/15/22 1000 (!) 97/43 -- -- 63 (!) 21 97 % --  12/15/22 0940 (!) 92/36 -- -- 65 17 97 % --  12/15/22 0930 (!) 76/31 -- --  65 (!) 22 96 % --  12/15/22 0830 (!) 96/41 -- -- 67 18 98 % --  12/15/22 0800 (!) 106/39 -- -- 67 20 95 % --  12/15/22 0747 -- 98.3 F (36.8 C) Oral 69 17 96 % --  12/15/22 0701 -- -- -- -- -- -- 92.6 kg  12/15/22 0700 (!) 91/40 -- -- 66 (!) 22 100 % --  12/15/22 0600 (!) 93/36 -- -- 66 (!) 22 98 % --  12/15/22 0500 (!) 94/45 98.6 F (37 C) Oral 64 18 95 % --  12/15/22 0400 (!) 99/43 -- -- 65 (!) 22 95 % --  12/15/22 0300 (!) 103/43 -- -- 67 (!) 23 94 % --  12/15/22 0200 (!) 101/42 -- -- 68 (!) 21 96 % --  12/15/22 0100 (!) 95/49 -- -- 63 19 97 % --  12/15/22 0000 (!) 90/42 98.2 F (36.8 C) Oral 61 17 95 % --  12/14/22 2300 (!) 85/41 -- -- (!) 59 17 96 % --  12/14/22 2230 (!) 86/41 -- -- 60 20 95 % --  12/14/22 2200 (!) 96/44 -- -- 62 20 96 % --  12/14/22 2100 -- -- -- 67 (!) 21 100 % --  12/14/22 2000 (!) 94/41 -- -- 65 19 97 % --  12/14/22 1930 (!) 97/44 -- -- 65 19 98 % --  12/14/22 1922 -- 98.6 F (37 C) Oral 64 19 98 % --  12/14/22 1900 (!) 87/46 -- -- 64 20 98 % --  12/14/22 1830 (!) 95/50 -- -- (!) 59 20 97 % --       PHYSICAL EXAM:  General:awake, - oriented in person, place . Lungs: b/ll air entry Heart:tachycardia Abd-  Peg pannus Rt femoral dialysis cath Extremities: left aka Sacrum stage 2 tears Skin: skin now is peeling over the arms, back           No erythema   Lab Results    Latest Ref Rng & Units 12/15/2022    5:21 AM 12/14/2022    4:40 AM 12/13/2022    4:28 AM  CBC  WBC 4.0 - 10.5 K/uL 22.6  40.2  37.5   Hemoglobin 12.0 - 15.0 g/dL 7.4  7.9  7.9   Hematocrit 36.0 - 46.0 % 25.2  25.3  25.5   Platelets 150 - 400 K/uL 353  328  351        Latest Ref Rng & Units 12/15/2022    5:21 AM 12/14/2022    4:40 AM 12/13/2022    4:28 AM  CMP  Glucose 70 - 99 mg/dL 811  914  782   BUN 8 - 23 mg/dL 61  43  90   Creatinine 0.44 - 1.00 mg/dL 9.56  2.13  0.86   Sodium 135 - 145 mmol/L 137  136  132   Potassium 3.5 - 5.1 mmol/L 3.4  3.6  3.4   Chloride 98 - 111 mmol/L 101  98  96   CO2 22 - 32 mmol/L 29  27  24    Calcium 8.9 - 10.3 mg/dL 8.1  8.2  8.4       Microbiology: 8/23 [facility] - MRSA 8/24 Cath tip MRSA 8/25 BC- NG so far Urine culture enterococcus and yeast Studies/Results:  Bibasilar infiltrate- ? atelectasis   Assessment/Plan: MRSA bacteremia ( 8/23 and 8/24 culture positive) with septic shock Due to  dialysis catheter and it has been removed Cath tip culture MRSA Repeat blood culture sent  on 8/25 2 d echo no veg will need TEE. If tee cannot be done need to empirically treat for 6 weeks on vancomycin Has a new femoral cath   ESRD on dialysis- previous cath was infected and was  removed New dialysis line placed in rt femoral vein- started dialysis  ? Severe leucocytosis- improving Could be secondary to  anemia Cdiff negative New dialysis cath placed yesterday- doubt It is the cause though  in the femoral area- leucocytosis was already worsening before cath was placed If no other etiology then the cath will have to be removed Urine culture enterococcus and yeast- doubt true infection  Pruritus and dry, peeling skin- no erythroderma- could be drug allergy VS dry skin Will dc  unasyn Need moisturizers Observe clsely   Aspiration pneumonitis on unasyn X 4 days Because of pruritus and skin peeling- would Dc it   Anemia Hb down to 6.7?, got PRBC  On admission it was 10.3 _ Transaminitis   CVA-  Paroxysmal Afib   DM- management as per primary team   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

## 2022-12-15 NOTE — Progress Notes (Signed)
[facility]  ROUNDING NOTE   Subjective:   Patient remains critically ill.    Still requiring Levophed.  Able to follow simple commands Now has a right femoral temp cath.  Objective:  Vital signs in last 24 hours:  Temp:  [98 F (36.7 C)-98.6 F (37 C)] 98.3 F (36.8 C) (09/02 0747) Pulse Rate:  [59-73] 62 (09/02 1105) Resp:  [14-24] 19 (09/02 1105) BP: (71-122)/(31-108) 100/42 (09/02 1105) SpO2:  [94 %-100 %] 98 % (09/02 1105) Weight:  [92.6 kg] 92.6 kg (09/02 0701)  Weight change: -0.8 kg Filed Weights   12/13/22 0445 12/14/22 0500 12/15/22 0701  Weight: 90.5 kg 93.4 kg 92.6 kg    Intake/Output: I/O last 3 completed shifts: In: 3700.3 [I.V.:1098.4; Other:430; NG/GT:1671.9; IV Piggyback:500] Out: 1730 [Urine:1200; Stool:530]   Intake/Output this shift:  Total I/O In: 1090.8 [I.V.:109.3; NG/GT:981.5] Out: 250 [Urine:250]  Physical Exam: General: Critically ill  Head: Normocephalic, atraumatic. Moist oral mucosal membranes  Eyes: Anicteric  Lungs:  Diminished bilaterally   Heart: regular   Abdomen:  Soft, nontender  Extremities:  1+ peripheral edema.  Neurologic: Alert to self and place,    Skin: No lesions  Access:  Right femoral temp HD catheter 8/29.     Basic Metabolic Panel: Recent Labs  Lab 12/09/22 0417 12/09/22 1256 12/11/22 0557 12/12/22 0512 12/13/22 0428 12/14/22 0440 12/15/22 0521  NA 132*   < > 139 138 132* 136 137  K 2.9*   < > 2.9* 3.9 3.4* 3.6 3.4*  CL 102   < > 105 96* 96* 98 101  CO2 20*   < > 23 25 24 27 29   GLUCOSE 191*   < > 132* 90 172* 160* 159*  BUN 57*   < > 99* 67* 90* 43* 61*  CREATININE 2.88*   < > 3.57* 2.42* 3.09* 1.72* 2.40*  CALCIUM 8.4*   < > 8.9 8.9 8.4* 8.2* 8.1*  MG 2.4  --  2.2 1.9  --  1.5* 2.7*  PHOS 1.7*   < > 1.7* 2.0* 3.6 2.3* 3.9   < > = values in this interval not displayed.    Liver Function Tests: Recent Labs  Lab 12/11/22 0557 12/12/22 0512 12/13/22 0428 12/14/22 0440  12/15/22 0521  ALBUMIN 2.1* 2.2* 2.1* 2.1* 1.9*   No results for input(s): "LIPASE", "AMYLASE" in the last 168 hours. No results for input(s): "AMMONIA" in the last 168 hours.  CBC: Recent Labs  Lab 12/11/22 0557 12/12/22 0512 12/13/22 0428 12/14/22 0440 12/15/22 0521  WBC 36.8* 49.8* 37.5* 40.2* 22.6*  HGB 8.0* 8.8* 7.9* 7.9* 7.4*  HCT 25.3* 28.5* 25.5* 25.3* 25.2*  MCV 86.9 87.4 90.1 87.5 91.3  PLT 301 280 351 328 353    Cardiac Enzymes: No results for input(s): "CKTOTAL", "CKMB", "CKMBINDEX", "TROPONINI" in the last 168 hours.  BNP: Invalid input(s): "POCBNP"  CBG: Recent Labs  Lab 12/14/22 1633 12/14/22 1941 12/14/22 2331 12/15/22 0345 12/15/22 0744  GLUCAP 120* 138* 136* 148* 166*    Microbiology: Results for orders placed or performed during the hospital encounter of 12/05/22  Culture, blood (Routine x 2)     Status: Abnormal   Collection Time: 12/05/22  8:56 PM   Specimen: BLOOD  Result Value Ref Range Status   Specimen Description   Final    BLOOD BLOOD RIGHT ARM Performed at [facility], [address] [place], [place] [address]    Special Requests   Final  BOTTLES DRAWN AEROBIC AND ANAEROBIC Blood Culture adequate volume Performed at [facility], [address] [place], [place] [address]    Culture  Setup Time   Final    GRAM POSITIVE COCCI IN BOTH AEROBIC AND ANAEROBIC BOTTLES CRITICAL RESULT CALLED TO, READ BACK BY AND VERIFIED WITH: SHEEMA HALLLAJI AT 1206 12/06/22.PMF Performed at [facility], [address] [place], [place] [address]    Culture (A)  Final    STAPHYLOCOCCUS AUREUS SUSCEPTIBILITIES PERFORMED ON PREVIOUS CULTURE WITHIN THE LAST 5 DAYS. Performed at [facility], [address], [place], [place] [address]    Report Status 12/08/2022 FINAL  Final  Culture, blood (Routine x 2)     Status: Abnormal   Collection Time: 12/05/22  8:57 PM   Specimen: BLOOD  Result Value Ref Range Status    Specimen Description   Final    BLOOD BLOOD LEFT ARM Performed at [facility], [address], [place], [place] [address]    Special Requests   Final    BOTTLES DRAWN AEROBIC AND ANAEROBIC Blood Culture adequate volume Performed at [facility], [address] [place], [place] [address]    Culture  Setup Time   Final    GRAM POSITIVE COCCI IN BOTH AEROBIC AND ANAEROBIC BOTTLES CRITICAL RESULT CALLED TO, READ BACK BY AND VERIFIED WITH: SHEEMA HALLAJI AT 1206 12/06/22.PMF Performed at [facility], [address], [place], [place] [address]    Culture METHICILLIN RESISTANT STAPHYLOCOCCUS AUREUS (A)  Final   Report Status 12/08/2022 FINAL  Final   Organism ID, Bacteria METHICILLIN RESISTANT STAPHYLOCOCCUS AUREUS  Final      Susceptibility   Methicillin resistant staphylococcus aureus - MIC*    CIPROFLOXACIN >=8 RESISTANT Resistant     ERYTHROMYCIN >=8 RESISTANT Resistant     GENTAMICIN <=0.5 SENSITIVE Sensitive     OXACILLIN >=4 RESISTANT Resistant     TETRACYCLINE <=1 SENSITIVE Sensitive     VANCOMYCIN <=0.5 SENSITIVE Sensitive     TRIMETH/SULFA >=320 RESISTANT Resistant     CLINDAMYCIN <=0.25 SENSITIVE Sensitive     RIFAMPIN <=0.5 SENSITIVE Sensitive     Inducible Clindamycin NEGATIVE Sensitive     LINEZOLID 2 SENSITIVE Sensitive     * METHICILLIN RESISTANT STAPHYLOCOCCUS AUREUS  Blood Culture ID Panel (Reflexed)     Status: Abnormal   Collection Time: 12/05/22  8:57 PM  Result Value Ref Range Status   Enterococcus faecalis NOT DETECTED NOT DETECTED Final   Enterococcus Faecium NOT DETECTED NOT DETECTED Final   Listeria monocytogenes NOT DETECTED NOT DETECTED Final   Staphylococcus species DETECTED (A) NOT DETECTED Final    Comment: CRITICAL RESULT CALLED TO, READ BACK BY AND VERIFIED WITH: SHEEMA HALLAJI AT 1206 12/06/22.PMF    Staphylococcus aureus (BCID) DETECTED (A) NOT DETECTED Final    Comment: Methicillin (oxacillin)-resistant  Staphylococcus aureus (MRSA). MRSA is predictably resistant to beta-lactam antibiotics (except ceftaroline). Preferred therapy is vancomycin unless clinically contraindicated. Patient requires contact precautions if  hospitalized. CRITICAL RESULT CALLED TO, READ BACK BY AND VERIFIED WITH: SHEEMA HALLAJI AT 1206 12/06/22.PMF    Staphylococcus epidermidis DETECTED (A) NOT DETECTED Final    Comment: CRITICAL RESULT CALLED TO, READ BACK BY AND VERIFIED WITH: SHEEMA HALLAJI AT 1206 12/06/22.PMF    Staphylococcus lugdunensis NOT DETECTED NOT DETECTED Final   Streptococcus species NOT DETECTED NOT DETECTED Final   Streptococcus agalactiae NOT DETECTED NOT DETECTED Final   Streptococcus pneumoniae NOT DETECTED NOT DETECTED Final  Streptococcus pyogenes NOT DETECTED NOT DETECTED Final   A.calcoaceticus-baumannii NOT DETECTED NOT DETECTED Final   Bacteroides fragilis NOT DETECTED NOT DETECTED Final   Enterobacterales NOT DETECTED NOT DETECTED Final   Enterobacter cloacae complex NOT DETECTED NOT DETECTED Final   Escherichia coli NOT DETECTED NOT DETECTED Final   Klebsiella aerogenes NOT DETECTED NOT DETECTED Final   Klebsiella oxytoca NOT DETECTED NOT DETECTED Final   Klebsiella pneumoniae NOT DETECTED NOT DETECTED Final   Proteus species NOT DETECTED NOT DETECTED Final   Salmonella species NOT DETECTED NOT DETECTED Final   Serratia marcescens NOT DETECTED NOT DETECTED Final   Haemophilus influenzae NOT DETECTED NOT DETECTED Final   Neisseria meningitidis NOT DETECTED NOT DETECTED Final   Pseudomonas aeruginosa NOT DETECTED NOT DETECTED Final   Stenotrophomonas maltophilia NOT DETECTED NOT DETECTED Final   Candida albicans NOT DETECTED NOT DETECTED Final   Candida auris NOT DETECTED NOT DETECTED Final   Candida glabrata NOT DETECTED NOT DETECTED Final   Candida krusei NOT DETECTED NOT DETECTED Final   Candida parapsilosis NOT DETECTED NOT DETECTED Final   Candida tropicalis NOT DETECTED NOT  DETECTED Final   Cryptococcus neoformans/gattii NOT DETECTED NOT DETECTED Final   Methicillin resistance mecA/C DETECTED (A) NOT DETECTED Final    Comment: CRITICAL RESULT CALLED TO, READ BACK BY AND VERIFIED WITH: SHEEMA HALLAJI AT 1206 12/06/22.PMF    Meth resistant mecA/C and MREJ DETECTED (A) NOT DETECTED Final    Comment: CRITICAL RESULT CALLED TO, READ BACK BY AND VERIFIED WITH: SHEEMA HALLAJI AT 1206 12/06/22.PMF Performed at [facility], [address] [place], [place] [address]   Resp panel by RT-PCR (RSV, Flu A&B, Covid) Anterior Nasal Swab     Status: None   Collection Time: 12/05/22  9:53 PM   Specimen: Anterior Nasal Swab  Result Value Ref Range Status   SARS Coronavirus 2 by RT PCR NEGATIVE NEGATIVE Final    Comment: (NOTE) SARS-CoV-2 target nucleic acids are NOT DETECTED.  The SARS-CoV-2 RNA is generally detectable in upper respiratory specimens during the acute phase of infection. The lowest concentration of SARS-CoV-2 viral copies this assay can detect is 138 copies/mL. A negative result does not preclude SARS-Cov-2 infection and should not be used as the sole basis for treatment or other patient management decisions. A negative result may occur with  improper specimen collection/handling, submission of specimen other than nasopharyngeal swab, presence of viral mutation(s) within the areas targeted by this assay, and inadequate number of viral copies(<138 copies/mL). A negative result must be combined with clinical observations, patient history, and epidemiological information. The expected result is Negative.  Fact Sheet for Patients:  BloggerCourse.com  Fact Sheet for Healthcare Providers:  SeriousBroker.it  This test is no t yet approved or cleared by the [place] FDA and  has been authorized for detection and/or diagnosis of SARS-CoV-2 by FDA under an Emergency Use Authorization (EUA). This  EUA will remain  in effect (meaning this test can be used) for the duration of the COVID-19 declaration under Section 564(b)(1) of the Act, 21 U.S.C.section 360bbb-3(b)(1), unless the authorization is terminated  or revoked sooner.       Influenza A by PCR NEGATIVE NEGATIVE Final   Influenza B by PCR NEGATIVE NEGATIVE Final    Comment: (NOTE) The Xpert Xpress SARS-CoV-2/FLU/RSV plus assay is intended as an aid in the diagnosis of influenza from Nasopharyngeal swab specimens and should not be used as a sole basis for treatment. Nasal washings and aspirates are  unacceptable for Xpert Xpress SARS-CoV-2/FLU/RSV testing.  Fact Sheet for Patients: BloggerCourse.com  Fact Sheet for Healthcare Providers: SeriousBroker.it  This test is not yet approved or cleared by the [place] FDA and has been authorized for detection and/or diagnosis of SARS-CoV-2 by FDA under an Emergency Use Authorization (EUA). This EUA will remain in effect (meaning this test can be used) for the duration of the COVID-19 declaration under Section 564(b)(1) of the Act, 21 U.S.C. section 360bbb-3(b)(1), unless the authorization is terminated or revoked.     Resp Syncytial Virus by PCR NEGATIVE NEGATIVE Final    Comment: (NOTE) Fact Sheet for Patients: BloggerCourse.com  Fact Sheet for Healthcare Providers: SeriousBroker.it  This test is not yet approved or cleared by the [place] FDA and has been authorized for detection and/or diagnosis of SARS-CoV-2 by FDA under an Emergency Use Authorization (EUA). This EUA will remain in effect (meaning this test can be used) for the duration of the COVID-19 declaration under Section 564(b)(1) of the Act, 21 U.S.C. section 360bbb-3(b)(1), unless the authorization is terminated or revoked.  Performed at [facility], [address] [place], [place]  [address]   MRSA Next Gen by PCR, Nasal     Status: Abnormal   Collection Time: 12/06/22  4:27 AM   Specimen: Nasal Mucosa; Nasal Swab  Result Value Ref Range Status   MRSA by PCR Next Gen DETECTED (A) NOT DETECTED Final    Comment: RESULT CALLED TO, READ BACK BY AND VERIFIED WITH: KRISTINE CHAMBERS AT 4401 12/06/22.PMF (NOTE) The GeneXpert MRSA Assay (FDA approved for NASAL specimens only), is one component of a comprehensive MRSA colonization surveillance program. It is not intended to diagnose MRSA infection nor to guide or monitor treatment for MRSA infections. Test performance is not FDA approved in patients less than 7 years old. Performed at [facility], [address], [place], [place] [address]   Cath Tip Culture     Status: Abnormal   Collection Time: 12/06/22  2:22 PM   Specimen: Catheter Tip; Other  Result Value Ref Range Status   Specimen Description   Final    CATH TIP Performed at [facility], [address] [place], [place] [address]    Special Requests   Final    NONE Performed at [facility], [address] [place], [place] [address]    Culture (A)  Final    >=100,000 COLONIES/mL METHICILLIN RESISTANT STAPHYLOCOCCUS AUREUS   Report Status 12/08/2022 FINAL  Final   Organism ID, Bacteria METHICILLIN RESISTANT STAPHYLOCOCCUS AUREUS (A)  Final      Susceptibility   Methicillin resistant staphylococcus aureus - MIC*    CIPROFLOXACIN >=8 RESISTANT Resistant     ERYTHROMYCIN >=8 RESISTANT Resistant     GENTAMICIN <=0.5 SENSITIVE Sensitive     OXACILLIN >=4 RESISTANT Resistant     TETRACYCLINE <=1 SENSITIVE Sensitive     VANCOMYCIN <=0.5 SENSITIVE Sensitive     TRIMETH/SULFA >=320 RESISTANT Resistant     CLINDAMYCIN <=0.25 SENSITIVE Sensitive     RIFAMPIN <=0.5 SENSITIVE Sensitive     Inducible Clindamycin NEGATIVE Sensitive     LINEZOLID 2 SENSITIVE Sensitive     * >=100,000 COLONIES/mL METHICILLIN RESISTANT STAPHYLOCOCCUS  AUREUS  Urine Culture (for pregnant, neutropenic or urologic patients or patients with an indwelling urinary catheter)     Status: Abnormal   Collection Time: 12/06/22  5:41 PM   Specimen: Urine, Clean Catch  Result Value Ref Range Status   Specimen Description  Final    URINE, CLEAN CATCH Performed at [facility], [address] [place], [place] [address]    Special Requests   Final    Normal Performed at [facility], [address] [place], [place] [address]    Culture (A)  Final    70,000 COLONIES/mL ENTEROCOCCUS FAECALIS 70,000 COLONIES/mL YEAST    Report Status 12/09/2022 FINAL  Final   Organism ID, Bacteria ENTEROCOCCUS FAECALIS (A)  Final      Susceptibility   Enterococcus faecalis - MIC*    AMPICILLIN <=2 SENSITIVE Sensitive     NITROFURANTOIN <=16 SENSITIVE Sensitive     VANCOMYCIN 1 SENSITIVE Sensitive     * 70,000 COLONIES/mL ENTEROCOCCUS FAECALIS  Culture, blood (Routine X 2) w Reflex to ID Panel     Status: None   Collection Time: 12/07/22  1:31 PM   Specimen: BLOOD  Result Value Ref Range Status   Specimen Description BLOOD BLOOD RIGHT HAND  Final   Special Requests   Final    BOTTLES DRAWN AEROBIC ONLY Blood Culture adequate volume   Culture   Final    NO GROWTH 5 DAYS Performed at [facility], [address] [place], [place] [address]    Report Status 12/12/2022 FINAL  Final  Culture, blood (Routine X 2) w Reflex to ID Panel     Status: None   Collection Time: 12/07/22  6:41 PM   Specimen: BLOOD  Result Value Ref Range Status   Specimen Description BLOOD BLOOD RIGHT HAND  Final   Special Requests   Final    BOTTLES DRAWN AEROBIC AND ANAEROBIC Blood Culture results may not be optimal due to an inadequate volume of blood received in culture bottles   Culture   Final    NO GROWTH 5 DAYS Performed at [facility], [address], [place], [place] [address]    Report Status 12/12/2022 FINAL  Final  C Difficile  Quick Screen (NO PCR Reflex)     Status: None   Collection Time: 12/12/22  4:07 PM   Specimen: STOOL  Result Value Ref Range Status   C Diff antigen NEGATIVE NEGATIVE Final   C Diff toxin NEGATIVE NEGATIVE Final   C Diff interpretation No C. difficile detected.  Final    Comment: Performed at [facility], [address] [place], [place] [address]  Gastrointestinal Panel by PCR , Stool     Status: None   Collection Time: 12/13/22  8:38 AM   Specimen: Stool  Result Value Ref Range Status   Campylobacter species NOT DETECTED NOT DETECTED Final   Plesimonas shigelloides NOT DETECTED NOT DETECTED Final   Salmonella species NOT DETECTED NOT DETECTED Final   Yersinia enterocolitica NOT DETECTED NOT DETECTED Final   Vibrio species NOT DETECTED NOT DETECTED Final   Vibrio cholerae NOT DETECTED NOT DETECTED Final   Enteroaggregative E coli (EAEC) NOT DETECTED NOT DETECTED Final   Enteropathogenic E coli (EPEC) NOT DETECTED NOT DETECTED Final   Enterotoxigenic E coli (ETEC) NOT DETECTED NOT DETECTED Final   Shiga like toxin producing E coli (STEC) NOT DETECTED NOT DETECTED Final   Shigella/Enteroinvasive E coli (EIEC) NOT DETECTED NOT DETECTED Final   Cryptosporidium NOT DETECTED NOT DETECTED Final   Cyclospora cayetanensis NOT DETECTED NOT DETECTED Final   Entamoeba histolytica NOT DETECTED NOT DETECTED Final   Giardia lamblia NOT DETECTED NOT DETECTED Final   Adenovirus F40/41 NOT DETECTED NOT DETECTED Final   Astrovirus NOT DETECTED  NOT DETECTED Final   Norovirus GI/GII NOT DETECTED NOT DETECTED Final   Rotavirus A NOT DETECTED NOT DETECTED Final   Sapovirus (I, II, IV, and V) NOT DETECTED NOT DETECTED Final    Comment: Performed at [facility], [address] [place], [place] [address]    Coagulation Studies: No results for input(s): "LABPROT", "INR" in the last 72 hours.   Urinalysis: No results for input(s): "COLORURINE", "LABSPEC", "PHURINE", "GLUCOSEU",  "HGBUR", "BILIRUBINUR", "KETONESUR", "PROTEINUR", "UROBILINOGEN", "NITRITE", "LEUKOCYTESUR" in the last 72 hours.  Invalid input(s): "APPERANCEUR"     Imaging: No results found.   Medications:    sodium chloride Stopped (12/15/22 0035)   sodium chloride Stopped (12/08/22 1823)   ampicillin-sulbactam (UNASYN) IV 3 g (12/14/22 2336)   anticoagulant sodium citrate     copper chloride 2 mg in dextrose 5 % 100 mL IVPB 2 mg (12/15/22 1027)   norepinephrine (LEVOPHED) Adult infusion 8 mcg/min (12/15/22 1006)    amiodarone  200 mg Per Tube BID   ascorbic acid  500 mg Per Tube BID   atorvastatin  40 mg Per Tube Daily   chlorhexidine  15 mL Mouth Rinse BID   Chlorhexidine Gluconate Cloth  6 each Topical Q0600   vitamin D3  2,000 Units Per Tube Daily   [START ON 12/18/2022] copper  2 mg Per Tube Daily   droxidopa  300 mg Per Tube TID WC   epoetin (EPOGEN/PROCRIT) injection  10,000 Units Intravenous Q T,Th,Sa-HD   escitalopram  10 mg Per Tube Daily   famotidine  10 mg Per Tube Daily   feeding supplement (NEPRO CARB STEADY)  1,000 mL Per Tube Q24H   feeding supplement (PROSource TF20)  60 mL Per Tube Daily   fludrocortisone  0.2 mg Per Tube Daily   free water  30 mL Per Tube Q4H   Gerhardt's butt cream   Topical BID   heparin injection (subcutaneous)  5,000 Units Subcutaneous Q8H   hydrOXYzine  50 mg Per Tube Q6H   insulin aspart  0-6 Units Subcutaneous Q4H   midodrine  15 mg Per Tube TID WC   multivitamin  1 tablet Per Tube QHS   mupirocin ointment   Nasal BID   nutrition supplement (JUVEN)  1 packet Per Tube BID BM   mouth rinse  15 mL Mouth Rinse 4 times per day   vancomycin variable dose per unstable renal function (pharmacist dosing)   Does not apply See admin instructions   sodium chloride, acetaminophen **OR** acetaminophen, alteplase, alteplase, anticoagulant sodium citrate, artificial tears, clonazePAM, fentaNYL (SUBLIMAZE) injection, heparin, heparin, ipratropium-albuterol,  lidocaine (PF), lidocaine-prilocaine, loperamide HCl, magnesium hydroxide, ondansetron **OR** ondansetron (ZOFRAN) IV, mouth rinse, pentafluoroprop-tetrafluoroeth, sennosides, traZODone  Assessment/ Plan:  Alexandra Foster is a 72 y.o.  female with past medical history of diabetes, COPD, GERD, hypertension. OA, a fib, and end stage renal disease on hemodialysis. Patient presents to the ED from her nursing facility complaining of altered mental status. She has been admitted for Lower urinary tract infectious disease [N39.0] Altered mental status, unspecified altered mental status type [R41.82] Sepsis due to gram-negative UTI (HCC) [A41.50, N39.0] Sepsis, due to unspecified organism, unspecified whether acute organ dysfunction present (HCC) [A41.9]  [facility]/TTS/Rt Permcath/81.3kg  End stage renal disease -Hemodialysis via femoral temp cath TTS schedule. -PermCath placement deferred until WBC count normalizes and infections are cleared. -Some dependent edema present.  UF goal as tolerated ~ 0.5 L -Significant hypoalbuminemia.  May need IV albumin supplementation during  dialysis for hemodynamic stability.  2. Hypotension due to sepsis. Blood cultures and dialysis cath tip cultures with MRSA. Repeat cultures with no growth. However now with significant leukocytosis.   - requiring vasopressors: norepinephrine.   - continue antibiotics: vancomycin and Unasyn.  Pharmacy assisting with dosing. - Appreciate ID input.   3. Anemia of chronic kidney disease : PRBC transfusion on 8/26. Hemoglobin 7.4  - EPO with HD treatments  4. Secondary Hyperparathyroidism:  with hypophosphatemia.   -Phosphorus level has now improved.     LOS: 9 Chalsea Darko 9/2/202411:28 AM

## 2022-12-15 NOTE — Progress Notes (Signed)
PHARMACY CONSULT NOTE  Pharmacy Consult for Electrolyte Monitoring and Replacement   Recent Labs: Potassium (mmol/L)  Date Value  12/15/2022 3.4 (L)  08/11/2013 3.3 (L)   Magnesium (mg/dL)  Date Value  16/01/9603 2.7 (H)   Calcium (mg/dL)  Date Value  54/12/8117 8.1 (L)   Calcium, Total (mg/dL)  Date Value  14/78/2956 9.2   Albumin (g/dL)  Date Value  21/30/8657 1.9 (L)  08/11/2013 3.1 (L)   Phosphorus (mg/dL)  Date Value  84/69/6295 3.9   Sodium (mmol/L)  Date Value  12/15/2022 137  08/11/2013 138    Assessment: 72 y.o. female with medical history significant for asthma, COPD, type diabetes mellitus, s/p G-tube, GERD, ESRD on HD, hypertension, osteoarthritis, paroxysmal atrial fibrillation and OSA on CPAP, who presented to the emergency room with acute onset of altered mental status with lethargy and decreased responsiveness now on CRRT.  CRRT stopped 8/25. Plan for line holiday. CVC placed 8/29 and HD performed same day. Patient is on tube feeds and having diarrhea. Hypophosphatemia suspected secondary to re-feeding / diarrhea.   Goal of Therapy:  Electrolytes WNL  Plan:  --20 mEq KCl per tube x 1 --Re-check electrolytes again tomorrow AM  Lowella Bandy 12/15/2022 7:02 AM

## 2022-12-15 NOTE — Progress Notes (Signed)
OT Cancellation Note  Patient Details Name: Alexandra Foster MRN: [id number] DOB: [birth year]   Cancelled Treatment:    Reason Eval/Treat Not Completed: Patient not medically ready. Consult received, chart reviewed. Recent BP noted to be 71/33 despite ongoing medication to support BP. MEWS score of 4. Will hold OT evaluation at this time and re-attempt at later date/time as medically appropriate.   Arman Filter., MPH, MS, OTR/L ascom [phone] 12/15/22, 11:05 AM

## 2022-12-15 NOTE — Progress Notes (Signed)
Daily Progress Note   Patient Name: Alexandra Foster       Date: 12/15/2022 DOB: July 10, 1950  Age: 72 y.o. MRN#: [id number] Attending Physician: Earley Brooke, MD Primary Care Physician: Patrecia Pour, MD Admit Date: 12/05/2022  Reason for Consultation/Follow-up: Establishing goals of care  Subjective: Notes and labs reviewed.  In to see patient.  She is currently resting in bed with eyes closed.  She does not awaken to my presence.  Patient's husband is at bedside.  He confirms full code/full scope status. He denies questions at this time.  Length of Stay: 9  Current Medications: Scheduled Meds:   amiodarone  200 mg Per Tube BID   ascorbic acid  500 mg Per Tube BID   atorvastatin  40 mg Per Tube Daily   chlorhexidine  15 mL Mouth Rinse BID   Chlorhexidine Gluconate Cloth  6 each Topical Q0600   vitamin D3  2,000 Units Per Tube Daily   [START ON 12/18/2022] copper  2 mg Per Tube Daily   droxidopa  300 mg Per Tube TID WC   epoetin (EPOGEN/PROCRIT) injection  10,000 Units Intravenous Q T,Th,Sa-HD   escitalopram  10 mg Per Tube Daily   famotidine  10 mg Per Tube Daily   feeding supplement (NEPRO CARB STEADY)  1,000 mL Per Tube Q24H   feeding supplement (PROSource TF20)  60 mL Per Tube Daily   fludrocortisone  0.2 mg Per Tube Daily   free water  30 mL Per Tube Q4H   Gerhardt's butt cream   Topical BID   heparin injection (subcutaneous)  5,000 Units Subcutaneous Q8H   hydrOXYzine  50 mg Per Tube Q6H   insulin aspart  0-6 Units Subcutaneous Q4H   midodrine  15 mg Per Tube TID WC   multivitamin  1 tablet Per Tube QHS   mupirocin ointment   Nasal BID   nutrition supplement (JUVEN)  1 packet Per Tube BID BM   mouth rinse  15 mL Mouth Rinse 4 times per day   vancomycin  variable dose per unstable renal function (pharmacist dosing)   Does not apply See admin instructions    Continuous Infusions:  sodium chloride Stopped (12/15/22 0035)   sodium chloride Stopped (12/08/22 1823)   [START ON 12/16/2022] albumin human     ampicillin-sulbactam (  UNASYN) IV Stopped (12/15/22 1312)   anticoagulant sodium citrate     copper chloride 2 mg in dextrose 5 % 100 mL IVPB Stopped (12/15/22 1227)   norepinephrine (LEVOPHED) Adult infusion 8 mcg/min (12/15/22 1419)    PRN Meds: sodium chloride, acetaminophen **OR** acetaminophen, alteplase, alteplase, anticoagulant sodium citrate, artificial tears, clonazePAM, fentaNYL (SUBLIMAZE) injection, heparin, heparin, ipratropium-albuterol, lidocaine (PF), lidocaine-prilocaine, loperamide HCl, magnesium hydroxide, ondansetron **OR** ondansetron (ZOFRAN) IV, mouth rinse, pentafluoroprop-tetrafluoroeth, sennosides, traZODone  Physical Exam Constitutional:      Comments: Eyes closed  Pulmonary:     Effort: Pulmonary effort is normal.             Vital Signs: BP (!) 88/43   Pulse 61   Temp 98.2 F (36.8 C) (Oral)   Resp 16   Ht 5\' 4"  (1.626 m)   Wt 92.6 kg   SpO2 97%   BMI 35.04 kg/m  SpO2: SpO2: 97 % O2 Device: O2 Device: Room Air O2 Flow Rate: O2 Flow Rate (L/min): 4 L/min  Intake/output summary:  Intake/Output Summary (Last 24 hours) at 12/15/2022 1554 Last data filed at 12/15/2022 1427 Gross per 24 hour  Intake 2448.52 ml  Output 1155 ml  Net 1293.52 ml   LBM: Last BM Date : 12/15/22 Baseline Weight: Weight: 84.7 kg Most recent weight: Weight: 92.6 kg    Patient Active Problem List   Diagnosis Date Noted   MRSA bacteremia 12/06/2022   Acute metabolic encephalopathy 12/06/2022   Paroxysmal atrial fibrillation (HCC) 12/06/2022   Anxiety and depression 12/06/2022   Type 2 diabetes mellitus with chronic kidney disease, with long-term current use of insulin (HCC) 12/06/2022   ESRD on hemodialysis (HCC)  12/06/2022   Septic shock (HCC) 12/06/2022   Abdominal distension    Left leg cellulitis    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   Subdural hemorrhage (HCC) 05/18/2021   Necrotizing fasciitis (HCC) 05/12/2021   Acute and chronic respiratory failure (acute-on-chronic) (HCC) 05/12/2021   On mechanically assisted ventilation (HCC) 05/12/2021   Shock liver 05/12/2021   Anemia associated with acute blood loss 05/12/2021   Metabolic acidosis 05/12/2021   Encounter for continuous renal replacement therapy (CRRT) for acute renal failure (HCC) 05/12/2021   Acute encephalopathy 05/12/2021   Group A streptococcal infection 05/12/2021   Streptococcal toxic shock syndrome (HCC) 05/12/2021   Atrial fibrillation and flutter (HCC)    Severe sepsis with septic shock (CODE) (HCC) 05/04/2021   Personal history of COVID-19 07/14/2019   Bilateral lower extremity edema 05/03/2018   Lower extremity pain, bilateral 05/03/2018   Diabetes mellitus type 2 in obese 04/23/2018   Osteoarthritis of both knees 01/14/2018   Urge incontinence 03/14/2016   Osteopenia 02/13/2016   OSA (obstructive sleep apnea) 01/08/2016   Allergic rhinitis 04/13/2015   COPD (chronic obstructive pulmonary disease) (HCC) 09/18/2014   Morbid obesity (HCC) 09/18/2014   Esophagitis, reflux 09/18/2014   Essential hypertension 09/21/2006    Palliative Care Assessment & Plan    Recommendations/Plan: Full code/full scope  Code Status:    Code Status Orders  (From admission, onward)           Start     Ordered   12/06/22 0124  Full code  Continuous       Question:  By:  Answer:  Consent: discussion documented in EHR   12/06/22 0132           Code Status History     Date Active Date Inactive Code Status Order ID Comments  User Context   05/04/2021 2329 06/13/2021 2307 Full Code [id number]  Jimmye Norman, NP ED   04/30/2019 1934 05/03/2019 1822 Full Code [id number]  Clydia Llano, MD Inpatient   04/29/2019  2048 04/30/2019 1830 Full Code [id number]  Mansy, Vernetta Honey, MD ED     Thank you for allowing the Palliative Medicine Team to assist in the care of this patient.   Morton Stall, NP  Please contact Palliative Medicine Team phone at [phone] for questions and concerns.

## 2022-12-15 NOTE — Plan of Care (Signed)
  Problem: Clinical Measurements: Goal: Diagnostic test results will improve Outcome: Progressing Goal: Signs and symptoms of infection will decrease Outcome: Progressing   Problem: Clinical Measurements: Goal: Will remain free from infection Outcome: Progressing Goal: Respiratory complications will improve Outcome: Progressing   Problem: Nutrition: Goal: Adequate nutrition will be maintained Outcome: Progressing   Problem: Pain Managment: Goal: General experience of comfort will improve Outcome: Progressing

## 2022-12-15 NOTE — Progress Notes (Signed)
NAME:  Alexandra Foster, MRN:  [id number], DOB:  1950-04-27, LOS: 9 ADMISSION DATE:  12/05/2022, CONSULTATION DATE:  12/06/2022 REFERRING MD:  Dr. Wynelle Link, CHIEF COMPLAINT:  Hypotension   Brief Pt Description / Synopsis:  72 y.o female with PMHx significant for ESRD on HD admitted with Acute Metabolic Encephalopathy in setting of Severe Sepsis with Septic Shock due to MRSA Bacteremia from infected right chest Permcath, along with Enterococcus Faecalis UTI.  History of Present Illness:  Alexandra Foster is a 72 y.o. female with past medical history significant for asthma, COPD, type diabetes mellitus, s/p G-tube, GERD, ESRD on HD, hypertension, osteoarthritis, paroxysmal atrial fibrillation and OSA on CPAP, who presented to [facility] ED on 12/05/22 from [facility] due to altered mental status with lethargy and decreased responsiveness.  There was a concern about aspiration pneumonia at his facility as the patient had an episode of nausea and vomiting and was reportedly coughing per her sister without wheezing or dyspnea or hypoxia.  The patient's temperature was 102.2 when EMS arrived to this facility.  The patient was not responding to questions throughout transport.  EMS noted the patient having slurred speech and significant weakness per family (family had not seen her since last week).  Code stroke was called upon arrival to the ED. teleneurology evaluated the patient, was not considered a candidate for thrombolytics given unclear last known well time, along with low suspicion for stroke given significantly global symptoms and multiple metabolic derangements and sepsis contributing to encephalopathy.  ED Course: Initial Vital Signs: Temperature 101.4 F orally, pulse 78, respiratory rate 19, blood pressure 125/70, SpO2 99% on room air Significant Labs: hyponatremia 128 and hypochloremia of 90 with blood glucose of 197, BUN of 67 and creatinine 4.53 with CO2 of 22 and anion gap 16. Alk phos was  157 and albumin 3.3, AST 66 and ALT 87 with total obtained of 8.6. Lactic acid was 2.3 and later 2.9. CBC showed leukocytosis of 21.4 with neutrophilia and anemia. Respiratory panel including influenza A, B, RSV and COVID-19 PCR came back negative. UA was positive for UTI alcohol level was less than 10. Urine drug screen was positive for tricyclic's.  Imaging Chest X-ray>>IMPRESSION: Mild vascular congestion likely related to volume overload. CT Head w/o contrast>>IMPRESSION: 1. No acute intracranial abnormality. 2. ASPECTS is 10. Medications Administered: IV Rocephin and Zithromax as well as 3 L bolus of IV normal saline   Hospitalist were asked to admit for further workup and treatment.  Nephrology consulted for hemodialysis.  Please see "significant hospital events" section below for full detailed hospital course.  Pertinent  Medical History   Past Medical History:  Diagnosis Date   AKI (acute kidney injury) (HCC)    a. 04/2021 in setting of bacteremia/shock.   Arthritis    Asthma    Bacteremia    a. 04/2021 S pyogenes bacteremia in setting of lower ext cellulitis.   COPD (chronic obstructive pulmonary disease) (HCC)    Diabetes mellitus without complication (HCC)    Endometriosis    GERD (gastroesophageal reflux disease)    History of echocardiogram    a. 07/2013 Echo: EF 55-60%, impaired relaxation, mild TR; b. 04/2021 Echo: EF 50-55%, mild LVH, nl RV fxn, mild BAE, Ao sclerosis w/o stenosis.   Hypertension    Obesity    PAF (paroxysmal atrial fibrillation) (HCC)    a. 04/2021 in setting of septic shock/cellulitis.   Sleep apnea    CPAP    Micro Data:  8/23:  SARS-CoV-2/RSV/Flu PCR>>negative 8/23: Blood cultures>> 4/4 bottles with MRSA & Staph epidermidis 8/23: MRSA PCR>> positive 8/23: Urine>> Enterococcus Faecalis 8/24: Permcath tip>> MRSA 8/25: Repeat Blood cultures>> no growth 8/30: Cdiff>>negative  8/31: GI Panel by PCR>>negative   Antimicrobials:    Anti-infectives (From admission, onward)    Start     Dose/Rate Route Frequency Ordered Stop   12/13/22 1800  vancomycin (VANCOCIN) IVPB 1000 mg/200 mL premix        1,000 mg 200 mL/hr over 60 Minutes Intravenous  Once 12/13/22 1158 12/13/22 1820   12/11/22 2345  Ampicillin-Sulbactam (UNASYN) 3 g in sodium chloride 0.9 % 100 mL IVPB        3 g 200 mL/hr over 30 Minutes Intravenous Every 12 hours 12/11/22 2252     12/11/22 1800  vancomycin (VANCOCIN) IVPB 1000 mg/200 mL premix        1,000 mg 200 mL/hr over 60 Minutes Intravenous  Once 12/11/22 1025 12/12/22 0635   12/09/22 1800  vancomycin (VANCOREADY) IVPB 750 mg/150 mL        750 mg 150 mL/hr over 60 Minutes Intravenous  Once 12/09/22 1423 12/09/22 1848   12/08/22 1040  vancomycin variable dose per unstable renal function (pharmacist dosing)         Does not apply See admin instructions 12/08/22 1041     12/07/22 1400  vancomycin (VANCOCIN) IVPB 1000 mg/200 mL premix  Status:  Discontinued        1,000 mg 200 mL/hr over 60 Minutes Intravenous Every 24 hours 12/06/22 1234 12/08/22 1041   12/06/22 2200  cefTRIAXone (ROCEPHIN) 2 g in sodium chloride 0.9 % 100 mL IVPB  Status:  Discontinued        2 g 200 mL/hr over 30 Minutes Intravenous Every 24 hours 12/06/22 0132 12/06/22 1223   12/06/22 1500  ceFEPIme (MAXIPIME) 2 g in sodium chloride 0.9 % 100 mL IVPB  Status:  Discontinued        2 g 200 mL/hr over 30 Minutes Intravenous Every 12 hours 12/06/22 1252 12/08/22 1040   12/06/22 1315  vancomycin (VANCOREADY) IVPB 1750 mg/350 mL        1,750 mg 175 mL/hr over 120 Minutes Intravenous  Once 12/06/22 1224 12/06/22 1724   12/05/22 2130  cefTRIAXone (ROCEPHIN) 2 g in sodium chloride 0.9 % 100 mL IVPB  Status:  Discontinued        2 g 200 mL/hr over 30 Minutes Intravenous Every 24 hours 12/05/22 2115 12/06/22 0202   12/05/22 2130  azithromycin (ZITHROMAX) 500 mg in sodium chloride 0.9 % 250 mL IVPB  Status:  Discontinued        500  mg 250 mL/hr over 60 Minutes Intravenous Every 24 hours 12/05/22 2115 12/06/22 1223        Significant Hospital Events: Including procedures, antibiotic start and stop dates in addition to other pertinent events   8/23: Presented from [facility] due to AMS, Code Stroke called due to slurred speech and weakness. Deemed not a candidate for thrombolytics due to no clear last known normal time.  Admitted by [facility] with Nephrology consultation for dialysis. 8/24: Rapid response called due to Hypotension prior to/during Hemodialysis.  Transfer to ICU for peripheral vasopressors and initiation of CRRT.  PCCM consulted to assist with vasopressors.  Blood cultures + for MRSA & Staph Epi. ID consulted. Vascular Surgery consulted, permcath removed.  Temporary HD catheter placed. 8/24: this morning with pronounce left facial droop and dysarthria, remains on CRRT  on Levo @8  keep MAP>55 12/08/22: patient with multispecies sepsis, removing central line today due to bacteremia. Holding HD as per nephrology team due to shock.  12/09/22-patient with loose watery BM today, she does have skin breakdown over abd pannus.  She had re-initiation of her psychiatric medications today.  S/p pRBC transfusion overnight. Electrolytes are being repleted.  On room air but requires levophed still.  12/10/22- patient laying in bed with no acute distress.  Levophed weaned to 74mcg/kg/min, afebrile on room air.  Handling NGT feeds well.  Purewick with no UOP this am. 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  12/11/22- patient off levophed , requires HD today, will place catheter per renal team.  Wound care to see for anal wound.  With episode of emesis during HD and concern for possible aspiration. 12/12/22- Back on low dose Levophed, weaning as able. Worsening Leukocytosis, ID plans to check for C.diff.  Ongoing discussions of when to remove newly placed temporary HD catheter. 12/13/22- Remains on levophed gtt despite scheduled  midodrine and droxidopa.  HD session completed 12/14/22- No acute events overnight pt remains on levophed gtt.  Received HD. 12/15/22- No acute events overnight, remains on Levophed.  Increase Droxidopa to 300 mg TID. Leukocytosis improving.  Interim History / Subjective:  -Received HD yesterday, UOP 700 cc last 24 hrs (net + 11L) -No significant events noted overnight, afebrile, on room air -Pt remains on levophed gtt currently requiring 7 mcg/min to maintain map goal ~ will increase Droxidopa to 300 mg TID -Leukocytosis improved to 22.6 from 40, procalcitonin improved to 3.1 from 4.3 -Pt's only complaint is fatigue  Objective   Blood pressure (!) 91/40, pulse 66, temperature 98.6 F (37 C), temperature source Oral, resp. rate (!) 22, height 5\' 4"  (1.626 m), weight 93.4 kg, SpO2 100%.        Intake/Output Summary (Last 24 hours) at 12/15/2022 0730 Last data filed at 12/15/2022 0602 Gross per 24 hour  Intake 2424.23 ml  Output 1080 ml  Net 1344.23 ml   Filed Weights   12/12/22 0440 12/13/22 0445 12/14/22 0500  Weight: 91.3 kg 90.5 kg 93.4 kg    Examination: General: Acute on chronically ill-appearing obese female, laying in bed, on room air, no acute distress HENT: Atraumatic, normocephalic, neck supple, difficult to assess JVD due to body habitus Lungs: Diminished breath sounds throughout, even, non labored, normal effort Cardiovascular: NSR, s1s2, no m/r/g, 2+ radial/1+ distal pulses, 1+ lower extremity edema  Abdomen: +BS x4, obese, soft, non tender, non distended Extremities: Prior left AKA Neuro: Alert, disoriented to time and situation, following commands, moves extremities to command (mild to moderate dysarthria, left facial droop which family reports is baseline) GU: Purewick in place   Resolved Hospital Problem list     Assessment & Plan:   #Septic Shock #Chronic Hypotension (on midodrine outpatient) PMHx: Paroxsymal Atrial Fibrillation, HTN Echocardiogram 12/07/2022:  LVEF 60-65%, RV systolic function low normal, RV size mildly enlarged, trivial mitral regurgitation - Continuous cardiac monitoring - Maintain MAP >55 per Nephrology - Vasopressors as needed to maintain MAP goal - Continue Midodrine, increase Droxidopa to 300 mg TID - Lactic acid is normalized - Repeat Cortisol is 15.5 (previously 35.9) ~ continue Florinef - TSH within normal limits   - Continue outpatient amiodarone  #Severe Sepsis due to MRSA BACTEREMIA due to infected Permcath & Enterococcus Faecalis UTI #Suspected Aspiration Permcath was removed on 8/24 2D Echocardiogram without evidence of vegetations - Trend WBC and monitor fever curve  -  Trend PCT  - Follow cultures  -ID following appreciate input ~continue unasyn & vancomycin pending cultures & sensitivities - ID recommends TEE ~ but pt is extremely high risk given hypotension, would be difficult to wean from ventilator ~ thus recommends 6 weeks of Vancomycin since unable to undergo TEE - Ongoing discussions of when to remove newly placed Temporary HD catheter (placed on 8/29)  #ESRD on Hemodialysis #Mild Hyponatremia - Trend BMP  - Ensure adequate renal perfusion - Avoid nephrotoxic agents as able - Monitor I&O's / urinary output - Replace electrolytes as indicated ~ Pharmacy following for assistance with electrolyte replacement - Nephrology following, appreciate input: HD per recommendations  #COPD without acute exacerbation #Concern for Aspiration #OSA, uses CPAP at home - Supplemental O2 as needed to maintain O2 sats 88 to 92% - CPAP qhs - Follow intermittent Chest X-ray & ABG as needed - Bronchodilators prn - Pulmonary toilet as able - ABX as above  #Anemia of chronic kidney disease - Trend CBC - Monitor for s/sx of bleeding  - VTE prophylaxis: subcutaneous heparin  - Transfuse for Hgb <7  #Diarrhea Cdiff 08/30: negative  - Continue rectal tube  - Prn imodium  #Type 2 diabetes mellitus - CBG's q4h;  Target range of 140 to 180 - SSI - Follow ICU Hypo/Hyperglycemia protocol - Hold outpatient metformin  #Acute metabolic encephalopathy in setting of sepsis and multiple metabolic derangements~improving  #? TIA (left sided weakness, left facial droop) PMHx: Depression and Anxiety CT Head 8/23 negative for acute intracranial abnormality Code stroke called 8/23, deemed not a candidate for thrombolytics given unclear last known well time CTA head and neck reviewed and shows no acute intracranial abnormality or LVO -Evaluated by inpatient Neurology on 8/25 ~ Recommendations included: "-Appreciate neurosurgery confirmation that anticoagulation can be resumed from their perspective, secure chat sent to neurosurgeon on-call -Echocardiogram pending, may need TEE, if TTE is negative to definitively exclude endocarditis (defer to primary team, cardiology and infectious disease on risk/benefit) -Anticoagulation for stroke risk prevention once endocarditis is ruled out or resolved, and after neurosurgical clearance -LDL goal less than 70, A1c goal less than 7% (A1c and lipid panel ordered), please adjust medications as needed -Carotid ultrasound -Outpatient follow-up for OSA/CPAP versus inpatient trial as this can increase stroke risk if untreated -Outpatient follow-up for pancreatic lesion as malignancy can increase stroke risk -Outpatient follow-up for IVC filter, could potentially be removed if anticoagulation is reinstituted" - Treatment of sepsis and metabolic derangements as outlined above - Provide supportive care - Promote normal sleep/wake cycle and family presence - Avoid sedating medications as able - Continue outpatient hydroxyzine, lexapro, and prn klonopin - PT/OT      Patient is critically ill with bacteremia and shock.  Prognosis is guarded, high risk for further decompensation, cardiac arrest and death.  Given current illness superimposed on advanced age and multiple chronic  comorbidities, overall long-term prognosis is poor.  Recommend DNR status.  Palliative Care was consulted but has since signed off as pt and family very clear they wish for full code and all aggressive measures.  Best Practice (right click and "Reselect all SmartList Selections" daily)   Diet/type: NPO, tube feeds DVT prophylaxis: prophylactic heparin  GI prophylaxis: Pepcid Lines: Right femoral trialysis and is still needed Foley:  N/A Code Status:  full code Last date of multidisciplinary goals of care discussion [12/15/22]  9/02: Will update pts daughter when she arrives at bedside   Labs   CBC: Recent Labs  Lab  12/11/22 0557 12/12/22 0512 12/13/22 0428 12/14/22 0440 12/15/22 0521  WBC 36.8* 49.8* 37.5* 40.2* 22.6*  HGB 8.0* 8.8* 7.9* 7.9* 7.4*  HCT 25.3* 28.5* 25.5* 25.3* 25.2*  MCV 86.9 87.4 90.1 87.5 91.3  PLT 301 280 351 328 353    Basic Metabolic Panel: Recent Labs  Lab 12/09/22 0417 12/09/22 1256 12/11/22 0557 12/12/22 0512 12/13/22 0428 12/14/22 0440 12/15/22 0521  NA 132*   < > 139 138 132* 136 137  K 2.9*   < > 2.9* 3.9 3.4* 3.6 3.4*  CL 102   < > 105 96* 96* 98 101  CO2 20*   < > 23 25 24 27 29   GLUCOSE 191*   < > 132* 90 172* 160* 159*  BUN 57*   < > 99* 67* 90* 43* 61*  CREATININE 2.88*   < > 3.57* 2.42* 3.09* 1.72* 2.40*  CALCIUM 8.4*   < > 8.9 8.9 8.4* 8.2* 8.1*  MG 2.4  --  2.2 1.9  --  1.5* 2.7*  PHOS 1.7*   < > 1.7* 2.0* 3.6 2.3* 3.9   < > = values in this interval not displayed.   GFR: Estimated Creatinine Clearance: 22.8 mL/min (A) (by C-G formula based on SCr of 2.4 mg/dL (H)). Recent Labs  Lab 12/09/22 0417 12/10/22 0551 12/12/22 0512 12/13/22 0428 12/14/22 0440 12/15/22 0521  PROCALCITON 4.32  --   --   --  3.18  --   WBC 33.7*   < > 49.8* 37.5* 40.2* 22.6*   < > = values in this interval not displayed.    Liver Function Tests: Recent Labs  Lab 12/11/22 0557 12/12/22 0512 12/13/22 0428 12/14/22 0440 12/15/22 0521   ALBUMIN 2.1* 2.2* 2.1* 2.1* 1.9*   No results for input(s): "LIPASE", "AMYLASE" in the last 168 hours. No results for input(s): "AMMONIA" in the last 168 hours.  ABG    Component Value Date/Time   PHART 7.40 05/14/2021 0341   PCO2ART 45 05/14/2021 0341   PO2ART 129 (H) 05/14/2021 0341   HCO3 27.9 05/14/2021 0341   ACIDBASEDEF 12.8 (H) 05/11/2021 0603   O2SAT 98.9 05/14/2021 0341     Coagulation Profile: No results for input(s): "INR", "PROTIME" in the last 168 hours.   Cardiac Enzymes: No results for input(s): "CKTOTAL", "CKMB", "CKMBINDEX", "TROPONINI" in the last 168 hours.  HbA1C: Hgb A1c MFr Bld  Date/Time Value Ref Range Status  12/07/2022 02:13 AM 5.2 4.8 - 5.6 % Final    Comment:    (NOTE) Pre diabetes:          5.7%-6.4%  Diabetes:              >6.4%  Glycemic control for   <7.0% adults with diabetes   05/05/2021 04:53 AM 6.7 (H) 4.8 - 5.6 % Final    Comment:    (NOTE)         Prediabetes: 5.7 - 6.4         Diabetes: >6.4         Glycemic control for adults with diabetes: <7.0     CBG: Recent Labs  Lab 12/14/22 1150 12/14/22 1633 12/14/22 1941 12/14/22 2331 12/15/22 0345  GLUCAP 130* 120* 138* 136* 148*    Review of Systems:   Positives in BOLD: Gen: Denies fever, chills, weight change, fatigue, night sweats HEENT: Denies blurred vision, double vision, hearing loss, tinnitus, sinus congestion, rhinorrhea, sore throat, neck stiffness, dysphagia PULM: Denies shortness of breath, cough, sputum  production, hemoptysis, wheezing CV: Denies chest pain, edema, orthopnea, paroxysmal nocturnal dyspnea, palpitations GI: Denies abdominal pain, nausea, vomiting, diarrhea, hematochezia, melena, constipation, change in bowel habits GU: Denies dysuria, hematuria, polyuria, oliguria, urethral discharge Endocrine: Denies hot or cold intolerance, polyuria, polyphagia or appetite change Derm: Denies rash, dry skin, scaling or peeling skin change Heme: Denies  easy bruising, bleeding, bleeding gums Neuro: Denies headache, numbness, weakness, slurred speech, loss of memory or consciousness   Past Medical History:  She,  has a past medical history of AKI (acute kidney injury) (HCC), Arthritis, Asthma, Bacteremia, COPD (chronic obstructive pulmonary disease) (HCC), Diabetes mellitus without complication (HCC), Endometriosis, GERD (gastroesophageal reflux disease), History of echocardiogram, Hypertension, Obesity, PAF (paroxysmal atrial fibrillation) (HCC), and Sleep apnea.   Surgical History:   Past Surgical History:  Procedure Laterality Date   ABDOMINAL HYSTERECTOMY     APPLICATION OF WOUND VAC Left 05/17/2021   Procedure: APPLICATION OF WOUND VAC/WOUND VAC EXCHANGE-Matrix Myriad;  Surgeon: Carolan Shiver, MD;  Location: [facility] ORS;  Service: General;  Laterality: Left;   APPLICATION OF WOUND VAC  05/24/2021   Procedure: APPLICATION OF WOUND VAC;  Surgeon: Carolan Shiver, MD;  Location: [facility] OR[facility];  Service: General;;   APPLICATION OF WOUND VAC  05/10/2021   Procedure: APPLICATION OF WOUND VAC;  Surgeon: Carolan Shiver, MD;  Location: [facility];  Service: General;;   COLONOSCOPY  10/29/2006   Dr Servando Snare   COLONOSCOPY WITH PROPOFOL N/A 11/05/2016   Procedure: COLONOSCOPY WITH PROPOFOL;  Surgeon: Earline Mayotte, MD;  Location: [facility];  Service: Endoscopy;  Laterality: N/A;   INCISION AND DRAINAGE OF WOUND Left 05/24/2021   Procedure: IRRIGATION AND DEBRIDEMENT LEFT LEG;  Surgeon: Carolan Shiver, MD;  Location: [facility];  Service: General;  Laterality: Left;   INCISION AND DRAINAGE OF WOUND Left 05/10/2021   Procedure: IRRIGATION AND DEBRIDEMENT LEFT LEG;  Surgeon: Carolan Shiver, MD;  Location: [facility];  Service: General;  Laterality: Left;   IR FLUORO GUIDE CV LINE RIGHT  06/12/2021   IR PERC TUN PERIT CATH WO PORT S&I /IMAG  06/12/2021   IR REPLC GASTRO/COLONIC TUBE PERCUT W/FLUORO  06/12/2021   IVC FILTER INSERTION  N/A 05/29/2021   Procedure: IVC FILTER INSERTION;  Surgeon: Renford Dills, MD;  Location: [facility];  Service: Cardiovascular;  Laterality: N/A;   MINOR GRAFT APPLICATION  05/24/2021   Procedure: Myriad Matrix  APPLICATION;  Surgeon: Carolan Shiver, MD;  Location: [facility];  Service: General;;   NASAL SINUS SURGERY  2002   Dr Chestine Spore   PEG PLACEMENT N/A 05/28/2021   Procedure: PERCUTANEOUS ENDOSCOPIC GASTROSTOMY (PEG) PLACEMENT;  Surgeon: Sung Amabile, DO;  Location: [facility];  Service: General;  Laterality: N/A;  TRAVEL CASE   TRACHEOSTOMY TUBE PLACEMENT N/A 05/17/2021   Procedure: TRACHEOSTOMY;  Surgeon: Geanie Logan, MD;  Location: [facility];  Service: ENT;  Laterality: N/A;   WOUND DEBRIDEMENT Left 05/07/2021   Procedure: DEBRIDEMENT WOUND;  Surgeon: Carolan Shiver, MD;  Location: [facility];  Service: General;  Laterality: Left;     Social History:   reports that she quit smoking about 21 years ago. Her smoking use included cigarettes. She started smoking about 61 years ago. She has a 80 pack-year smoking history. She quit smokeless tobacco use about 21 years ago.  Her smokeless tobacco use included snuff. She reports that she does not drink alcohol and does not use drugs.   Family History:  Her family history includes Alcohol abuse in her brother;  Breast cancer (age of onset: 78) in her sister; Congestive Heart Failure in her mother; Coronary artery disease (age of onset: 51) in her father; Heart disease in her brother; Varicose Veins in her brother.   Allergies No Known Allergies   Home Medications  Prior to Admission medications   Medication Sig Start Date End Date Taking? Authorizing Provider  amiodarone (PACERONE) 200 MG tablet Place 1 tablet (200 mg total) into feeding tube 2 (two) times daily. 06/13/21  Yes Erin Fulling, MD  ascorbic acid (VITAMIN C) 500 MG tablet Place 1 tablet (500 mg total) into feeding tube 2 (two) times daily. 06/13/21  Yes Kasa,  Wallis Bamberg, MD  busPIRone (BUSPAR) 5 MG tablet Take 5 mg by mouth 2 (two) times daily. 12/04/22  Yes [provider]  cholecalciferol (VITAMIN D) 25 MCG tablet Place 2 tablets (2,000 Units total) into feeding tube daily. 06/14/21  Yes Kasa, Wallis Bamberg, MD  clonazePAM (KLONOPIN) 0.5 MG tablet Take 0.25-0.5 mg by mouth 2 (two) times daily. 0.25 mg every morning and 0.5 mg at bedtime for Agitation 11/18/22  Yes [provider]  escitalopram (LEXAPRO) 10 MG tablet Place 1 tablet (10 mg total) into feeding tube daily. 06/14/21  Yes Erin Fulling, MD  hydrOXYzine (ATARAX) 50 MG tablet Take 100 mg by mouth 2 (two) times daily. 12/02/22  Yes [provider]  lip balm (BLISTEX) OINT Apply 1 application topically 3 (three) times daily. 06/13/21  Yes Erin Fulling, MD  loperamide (IMODIUM) 2 MG capsule Take 1 capsule (2 mg total) by mouth as needed for diarrhea or loose stools. 06/13/21  Yes Erin Fulling, MD  midodrine (PROAMATINE) 5 MG tablet Place 3 tablets (15 mg total) into feeding tube 3 (three) times daily with meals. 06/13/21  Yes Erin Fulling, MD  multivitamin (RENA-VIT) TABS tablet Place 1 tablet into feeding tube at bedtime. 06/13/21  Yes Erin Fulling, MD  omeprazole (PRILOSEC) 20 MG capsule Take 20 mg by mouth daily. 11/12/22  Yes [provider]  sennosides (SENOKOT) 8.8 MG/5ML syrup Place 10 mLs into feeding tube at bedtime as needed for moderate constipation. 06/13/21  Yes Erin Fulling, MD  artificial tears (LACRILUBE) OINT ophthalmic ointment Place into both eyes every 4 (four) hours as needed for dry eyes. Patient not taking: Reported on 12/06/2022 06/13/21   Erin Fulling, MD  chlorhexidine gluconate, MEDLINE KIT, (PERIDEX) 0.12 % solution 15 mLs by Mouth Rinse route 2 (two) times daily. Patient not taking: Reported on 12/06/2022 06/13/21   Erin Fulling, MD  collagenase (SANTYL) ointment Apply topically daily. Patient not taking: Reported on 12/06/2022 06/14/21   Erin Fulling, MD  dextrose 5 %  SOLN 100 mL with copper chloride 0.4 MG/ML SOLN 2 mg Inject 2 mg into the vein daily. 06/14/21   Erin Fulling, MD  ipratropium-albuterol (DUONEB) 0.5-2.5 (3) MG/3ML SOLN Take 3 mLs by nebulization 3 (three) times daily. Patient not taking: Reported on 12/06/2022 06/13/21   Erin Fulling, MD  lactobacillus (FLORANEX/LACTINEX) PACK Place 1 packet (1 g total) into feeding tube 3 (three) times daily with meals. Patient not taking: Reported on 12/06/2022 06/13/21   Erin Fulling, MD  pantoprazole sodium (PROTONIX) 40 mg Place 40 mg into feeding tube daily. Patient not taking: Reported on 12/06/2022 06/13/21   Erin Fulling, MD  Scheduled Meds:  amiodarone  200 mg Per Tube BID   ascorbic acid  500 mg Per Tube BID   atorvastatin  40 mg Per Tube Daily   chlorhexidine  15 mL Mouth  Rinse BID   Chlorhexidine Gluconate Cloth  6 each Topical Q0600   vitamin D3  2,000 Units Per Tube Daily   [START ON 12/18/2022] copper  2 mg Per Tube Daily   droxidopa  200 mg Per Tube TID WC   epoetin (EPOGEN/PROCRIT) injection  10,000 Units Intravenous Q T,Th,Sa-HD   escitalopram  10 mg Per Tube Daily   famotidine  10 mg Per Tube Daily   feeding supplement (NEPRO CARB STEADY)  1,000 mL Per Tube Q24H   feeding supplement (PROSource TF20)  60 mL Per Tube Daily   fludrocortisone  0.2 mg Per Tube Daily   free water  30 mL Per Tube Q4H   Gerhardt's butt cream   Topical BID   heparin injection (subcutaneous)  5,000 Units Subcutaneous Q8H   hydrOXYzine  50 mg Per Tube Q6H   insulin aspart  0-6 Units Subcutaneous Q4H   midodrine  15 mg Per Tube TID WC   multivitamin  1 tablet Per Tube QHS   mupirocin ointment   Nasal BID   nutrition supplement (JUVEN)  1 packet Per Tube BID BM   mouth rinse  15 mL Mouth Rinse 4 times per day   potassium chloride  20 mEq Per Tube Once   vancomycin variable dose per unstable renal function (pharmacist dosing)   Does not apply See admin instructions   Continuous Infusions:  sodium chloride Stopped  (12/07/22 0210)   sodium chloride Stopped (12/08/22 1823)   ampicillin-sulbactam (UNASYN) IV 3 g (12/14/22 2336)   anticoagulant sodium citrate     copper chloride 2 mg in dextrose 5 % 100 mL IVPB Stopped (12/14/22 1121)   norepinephrine (LEVOPHED) Adult infusion 7 mcg/min (12/15/22 0602)   PRN Meds:.sodium chloride, acetaminophen **OR** acetaminophen, alteplase, alteplase, anticoagulant sodium citrate, artificial tears, clonazePAM, fentaNYL (SUBLIMAZE) injection, heparin, heparin, ipratropium-albuterol, lidocaine (PF), lidocaine-prilocaine, loperamide HCl, magnesium hydroxide, ondansetron **OR** ondansetron (ZOFRAN) IV, mouth rinse, pentafluoroprop-tetrafluoroeth, sennosides, traZODone   Critical care time: 38 minutes    Harlon Ditty, AGACNP-BC [facility] Prefer epic messenger for cross cover needs If after hours, please call E-link

## 2022-12-16 DIAGNOSIS — D72829 Elevated white blood cell count, unspecified: Secondary | ICD-10-CM

## 2022-12-16 DIAGNOSIS — R7881 Bacteremia: Secondary | ICD-10-CM

## 2022-12-16 DIAGNOSIS — B9562 Methicillin resistant Staphylococcus aureus infection as the cause of diseases classified elsewhere: Secondary | ICD-10-CM | POA: Diagnosis not present

## 2022-12-16 DIAGNOSIS — R6521 Severe sepsis with septic shock: Secondary | ICD-10-CM | POA: Diagnosis not present

## 2022-12-16 DIAGNOSIS — A419 Sepsis, unspecified organism: Secondary | ICD-10-CM | POA: Diagnosis not present

## 2022-12-16 DIAGNOSIS — T8571XA Infection and inflammatory reaction due to peritoneal dialysis catheter, initial encounter: Secondary | ICD-10-CM | POA: Diagnosis not present

## 2022-12-16 DIAGNOSIS — N186 End stage renal disease: Secondary | ICD-10-CM | POA: Diagnosis not present

## 2022-12-16 LAB — MISC LABCORP TEST (SEND OUT)
LabCorp test name: 1
Labcorp test code: 96388

## 2022-12-16 LAB — RENAL FUNCTION PANEL
Albumin: 2.5 g/dL — ABNORMAL LOW (ref 3.5–5.0)
Anion gap: 10 (ref 5–15)
BUN: 75 mg/dL — ABNORMAL HIGH (ref 8–23)
CO2: 27 mmol/L (ref 22–32)
Calcium: 8.5 mg/dL — ABNORMAL LOW (ref 8.9–10.3)
Chloride: 99 mmol/L (ref 98–111)
Creatinine, Ser: 2.77 mg/dL — ABNORMAL HIGH (ref 0.44–1.00)
GFR, Estimated: 17 mL/min — ABNORMAL LOW (ref >60)
Glucose, Bld: 148 mg/dL — ABNORMAL HIGH (ref 70–99)
Phosphorus: 4.1 mg/dL (ref 2.5–4.6)
Potassium: 3.4 mmol/L — ABNORMAL LOW (ref 3.5–5.1)
Sodium: 136 mmol/L (ref 135–145)

## 2022-12-16 LAB — GLUCOSE, CAPILLARY
Glucose-Capillary: 110 mg/dL — ABNORMAL HIGH (ref 70–99)
Glucose-Capillary: 124 mg/dL — ABNORMAL HIGH (ref 70–99)
Glucose-Capillary: 138 mg/dL — ABNORMAL HIGH (ref 70–99)
Glucose-Capillary: 141 mg/dL — ABNORMAL HIGH (ref 70–99)
Glucose-Capillary: 144 mg/dL — ABNORMAL HIGH (ref 70–99)
Glucose-Capillary: 145 mg/dL — ABNORMAL HIGH (ref 70–99)
Glucose-Capillary: 150 mg/dL — ABNORMAL HIGH (ref 70–99)

## 2022-12-16 LAB — T3, FREE: T3, Free: 2.3 pg/mL (ref 2.0–4.4)

## 2022-12-16 LAB — CBC
HCT: 24 % — ABNORMAL LOW (ref 36.0–46.0)
Hemoglobin: 7.1 g/dL — ABNORMAL LOW (ref 12.0–15.0)
MCH: 27.2 pg (ref 26.0–34.0)
MCHC: 29.6 g/dL — ABNORMAL LOW (ref 30.0–36.0)
MCV: 92 fL (ref 80.0–100.0)
Platelets: 385 10*3/uL (ref 150–400)
RBC: 2.61 MIL/uL — ABNORMAL LOW (ref 3.87–5.11)
RDW: 22.5 % — ABNORMAL HIGH (ref 11.5–15.5)
WBC: 18.2 10*3/uL — ABNORMAL HIGH (ref 4.0–10.5)
nRBC: 1.3 % — ABNORMAL HIGH (ref 0.0–0.2)

## 2022-12-16 LAB — PREPARE RBC (CROSSMATCH)

## 2022-12-16 LAB — LACTIC ACID, PLASMA
Lactic Acid, Venous: 1.4 mmol/L (ref 0.5–1.9)
Lactic Acid, Venous: 1.5 mmol/L (ref 0.5–1.9)

## 2022-12-16 MED ORDER — EPOETIN ALFA 10000 UNIT/ML IJ SOLN
INTRAMUSCULAR | Status: AC
  Filled 2022-12-16: qty 1

## 2022-12-16 MED ORDER — SODIUM CHLORIDE 0.9% IV SOLUTION: Freq: Once | INTRAVENOUS | Status: DC

## 2022-12-16 MED ORDER — DIPHENHYDRAMINE HCL 50 MG/ML IJ SOLN
12.5000 mg | Freq: Once | INTRAMUSCULAR | Status: AC
  Administered 2022-12-16: 12.5 mg via INTRAVENOUS

## 2022-12-16 MED ORDER — SODIUM CHLORIDE 0.9 % IV SOLN: INTRAVENOUS | Status: DC

## 2022-12-16 MED ORDER — VANCOMYCIN HCL IN DEXTROSE 1-5 GM/200ML-% IV SOLN
1000.0000 mg | INTRAVENOUS | Status: DC
  Administered 2022-12-16: 1000 mg via INTRAVENOUS
  Filled 2022-12-16 (×2): qty 200

## 2022-12-16 MED ORDER — POTASSIUM CHLORIDE 20 MEQ PO PACK
20.0000 meq | PACK | Freq: Once | ORAL | Status: AC
  Administered 2022-12-16: 20 meq
  Filled 2022-12-16: qty 1

## 2022-12-16 MED ORDER — MIDODRINE HCL 5 MG PO TABS
20.0000 mg | ORAL_TABLET | Freq: Three times a day (TID) | ORAL | Status: DC
  Administered 2022-12-16 – 2023-01-03 (×50): 20 mg
  Filled 2022-12-16 (×50): qty 4

## 2022-12-16 NOTE — Progress Notes (Signed)
[facility]    Date of Admission:  12/05/2022   Total days of antibiotics 10 since clearance of bacteremia   ID: Alexandra Foster is a 72 y.o. female with  ESRD with MRSA bacteremia, temp cath removed, now with right femoral temp catheter Principal Problem:   Septic shock (HCC) Active Problems:   MRSA bacteremia   Acute metabolic encephalopathy   Paroxysmal atrial fibrillation (HCC)   Anxiety and depression   Type 2 diabetes mellitus with chronic kidney disease, with long-term current use of insulin (HCC)   ESRD on hemodialysis (HCC)    Subjective: Pruritic rash, mostly back. "Can you scratch my back"  Medications:   sodium chloride   Intravenous Once   amiodarone  200 mg Per Tube BID   ascorbic acid  500 mg Per Tube BID   atorvastatin  40 mg Per Tube Daily   chlorhexidine  15 mL Mouth Rinse BID   Chlorhexidine Gluconate Cloth  6 each Topical Q0600   vitamin D3  2,000 Units Per Tube Daily   [START ON 12/18/2022] copper  2 mg Per Tube Daily   droxidopa  300 mg Per Tube TID WC   epoetin (EPOGEN/PROCRIT) injection  10,000 Units Intravenous Q T,Th,Sa-HD   escitalopram  10 mg Per Tube Daily   famotidine  10 mg Per Tube Daily   feeding supplement (NEPRO CARB STEADY)  1,000 mL Per Tube Q24H   feeding supplement (PROSource TF20)  60 mL Per Tube Daily   fludrocortisone  0.2 mg Per Tube Daily   free water  30 mL Per Tube Q4H   Gerhardt's butt cream   Topical BID   heparin injection (subcutaneous)  5,000 Units Subcutaneous Q8H   hydrOXYzine  50 mg Per Tube Q6H   insulin aspart  0-6 Units Subcutaneous Q4H   midodrine  20 mg Per Tube TID WC   multivitamin  1 tablet Per Tube QHS   mupirocin ointment   Nasal BID   nutrition supplement (JUVEN)  1 packet Per Tube BID BM   mouth rinse  15 mL Mouth Rinse 4 times per day    Objective: Vital signs in last 24 hours: Temp:  [98.1 F (36.7 C)-98.7 F (37.1 C)] 98.7 F (37.1 C) (09/03 1505) Pulse Rate:   [58-68] 64 (09/03 1517) Resp:  [15-26] 26 (09/03 1517) BP: (78-132)/(31-104) 122/58 (09/03 1517) SpO2:  [94 %-100 %] 100 % (09/03 1517) Weight:  [89.8 kg-92.6 kg] 89.8 kg (09/03 1517)  Physical Exam  Constitutional:  oriented to person, place, and time. appears well-developed and well-nourished. No distress.  HENT: [place]/AT, PERRLA, no scleral icterus Mouth/Throat: Oropharynx is clear and moist. No oropharyngeal exudate.  Cardiovascular: Normal rate, regular rhythm and normal heart sounds. Exam reveals no gallop and no friction rub.  No murmur heard.  Pulmonary/Chest: Effort normal and breath sounds normal. No respiratory distress.  has no wheezes.  Neck = supple, no nuchal rigidity Abdominal: Soft. Bowel sounds are normal.  exhibits no distension. There is no tenderness.  Lymphadenopathy: no cervical adenopathy. No axillary adenopathy Neurological: alert and oriented to person, place, and time.  Skin: Skin is warm and dry. No rash noted. No erythema. Dry flaking skin to chest, back, axillary, spares legs. Stomach, no mucosal involvement Psychiatric: a normal mood and affect.  behavior is normal.    Lab Results Recent Labs    12/15/22 0521 12/16/22 0531  WBC 22.6* 18.2*  HGB 7.4* 7.1*  HCT 25.2* 24.0*  NA 137 136  K 3.4* 3.4*  CL 101 99  CO2 29 27  BUN 61* 75*  CREATININE 2.40* 2.77*   Liver Panel Recent Labs    12/15/22 0521 12/16/22 0531  ALBUMIN 1.9* 2.5*   Sedimentation Rate No results for input(s): "ESRSEDRATE" in the last 72 hours. C-Reactive Protein Recent Labs    12/14/22 1020  CRP 3.8*    Microbiology: reviewed Studies/Results: No results found.   Assessment/Plan: MRSA bacteremia = continue with vancomycin. Awaiting for patient to get TEE so that can decide length of treatment  Pruritic rash = currently on atarax for symptom relief. Rash thought to be new. Amp/sub was discontinued yesterday. We will continue to monitor to see if it is improving. If  it worsens, we may need to change vancomycin to daptomycin--will hold off presently to change to dapto.  Leukocytosis = improving  Hd line access = if TEE is negative, will discuss with renal if there are any other sites other than femoral site.I suspect not since it is usually a site of last resort  [facility] Pager: [phone]  12/16/2022, 5:13 PM

## 2022-12-16 NOTE — Progress Notes (Signed)
Nutrition Follow-up  DOCUMENTATION CODES:   Obesity unspecified  INTERVENTION:   Continue nocturnal tube feeds:  Nepro @65ml /hr x 16 hrs (from 1800-1000)  ProSource TF 20- Give 60ml daily via tube, each supplement provides 80kcal and 20g of protein.   Free water flushes 30ml q4 hours to maintain tube patency   Regimen provides 1952kcal/day, 104g/day protein and 953ml/day of free water.   Rena-vit daily via tube   Juven Fruit Punch BID via tube, each serving provides 95kcal and 2.5g of protein (amino acids glutamine and arginine)  Pt at high refeed risk; recommend monitor potassium, magnesium and phosphorus labs daily until stable  Daily weights   Copper 2mg  IV daily x 7 days, followed by 2mg  daily via tube x 30 days.   NUTRITION DIAGNOSIS:   Inadequate oral intake related to dysphagia as evidenced by NPO status (pt with chronic G-tube). -ongoing  GOAL:   Patient will meet greater than or equal to 90% of their needs -met  MONITOR:   Labs, Weight trends, TF tolerance, Skin, I & O's  ASSESSMENT:   72 y.o. female with a history of asthma/COPD, HTN, DMII, obesity, OSA on CPAP, SDH, GERD, ESRD on HD, afib, anxiety, depression, possible cirrhosis and a lengthy admission (04/2021) for necrotizing fasciitis, bacteremia and sepsos ( requiring L AKA, tracheostomy and G-tube placement) and who is now admitted with acute metabolic encephalopathy in setting of severe sepsis with septic shock due to MRSA & staph epi bacteremia (suspected from right chest permcath) and UTI.  Pt continues to tolerate tube feeds well on her home regimen. Pt did have an episode of vomiting but this is normal for her baseline. Daughter reports pt with intermittent vomiting at baseline and pt did have intermittent vomiting during her last admission. It was suspected that pt may have some gastroparesis and pt has intermittently required erythromycin in the past. Vitamin labs returned and are much improved. Pt  does continue to have copper deficiency which is being supplemented; RD suspects this is in relation to pt being on zinc for over a year and chronic HD. Plan is for possible TEE. Per chart, pt is up ~10lbs since admission. Pt +12.1L on her I & Os. Plan is for HD today.   Medications reviewed and include: vitamin C, vitamin D, copper, epoetin, pepcid, heparin, insulin, midodrine, rena-vit, juven, levophed  Labs reviewed: K 3.4(L), BUN 75(H), creat 2.77(H), P 4.1 wnl Mg 2.7(H)- 9/2 Iron 45, TIBC 227(L), ferritin 1642(H), transferrin 159(L), folate 20.7- 8/30 Copper 29(L), vitamin D 34.20, vitamin A 24.9, vitamin C 1.0, vitamin E 11.6, zinc 55- 8/26 Wbc- 18.2(L), Hgb 7.1(L), Hct 24.0(L) Cbgs- 145, 138, 141 x 24 hrs   UOP-   Diet Order:   Diet Order     None      EDUCATION NEEDS:   No education needs have been identified at this time  Skin:  Skin Assessment: Skin Integrity Issues: Skin Integrity Issues:: Stage II Stage II: L/R buttocks  Last BM:  9/3- via rectal tube  Height:   Ht Readings from Last 1 Encounters:  12/05/22 5\' 4"  (1.626 m)    Weight:   Wt Readings from Last 1 Encounters:  12/16/22 89.8 kg   BMI:  Body mass index is 33.98 kg/m.  Estimated Nutritional Needs:   Kcal:  1800-2100kcal/day  Protein:  90-105g/day  Fluid:  UOP +1L  Betsey Holiday MS, RD, LDN Please refer to [facility] for RD and/or RD on-call/weekend/after hours pager

## 2022-12-16 NOTE — Progress Notes (Signed)
[facility]  ROUNDING NOTE   Subjective:   Patient remains critically ill.    Still requiring Levophed.  Able to follow simple commands Now has a right femoral temp cath. Seen during HD. Tolerating well. Getting iv copper infusion Nocturnal Tube feeds via G tube   HEMODIALYSIS FLOWSHEET:  Blood Flow Rate (mL/min): 350 mL/min Arterial Pressure (mmHg): -170 mmHg Venous Pressure (mmHg): 180 mmHg TMP (mmHg): 4 mmHg Ultrafiltration Rate (mL/min): 479 mL/min Dialysate Flow Rate (mL/min): 300 ml/min Dialysis Fluid Bolus: Normal Saline Bolus Amount (mL): 200 mL   Objective:  Vital signs in last 24 hours:  Temp:  [97.7 F (36.5 C)-98.4 F (36.9 C)] 98.1 F (36.7 C) (09/03 1320) Pulse Rate:  [41-68] 63 (09/03 1430) Resp:  [15-24] 21 (09/03 1430) BP: (78-132)/(31-104) 111/44 (09/03 1430) SpO2:  [94 %-100 %] 98 % (09/03 1430) Weight:  [89.8 kg-92.6 kg] 89.8 kg (09/03 1145)  Weight change:  Filed Weights   12/15/22 0701 12/16/22 0500 12/16/22 1145  Weight: 92.6 kg 92.6 kg 89.8 kg    Intake/Output: I/O last 3 completed shifts: In: 2650.9 [I.V.:995.3; NG/GT:1355.6; IV Piggyback:300] Out: 1325 [Urine:925; Stool:400]   Intake/Output this shift:  Total I/O In: 420 [I.V.:30; Blood:390] Out: -   Physical Exam: General: Critically ill  Head: Normocephalic, atraumatic. Moist oral mucosal membranes  Eyes: Anicteric  Lungs:  Diminished bilaterally   Heart: regular   Abdomen:  Soft, nontender  Extremities:  1+ peripheral edema.  Neurologic: Alert to self and place,    Access:  Right femoral temp HD catheter 8/29.     Basic Metabolic Panel: Recent Labs  Lab 12/11/22 0557 12/12/22 0512 12/13/22 0428 12/14/22 0440 12/15/22 0521 12/16/22 0531  NA 139 138 132* 136 137 136  K 2.9* 3.9 3.4* 3.6 3.4* 3.4*  CL 105 96* 96* 98 101 99  CO2 23 25 24 27 29 27   GLUCOSE 132* 90 172* 160* 159* 148*  BUN 99* 67* 90* 43* 61* 75*  CREATININE 3.57* 2.42* 3.09* 1.72*  2.40* 2.77*  CALCIUM 8.9 8.9 8.4* 8.2* 8.1* 8.5*  MG 2.2 1.9  --  1.5* 2.7*  --   PHOS 1.7* 2.0* 3.6 2.3* 3.9 4.1    Liver Function Tests: Recent Labs  Lab 12/12/22 0512 12/13/22 0428 12/14/22 0440 12/15/22 0521 12/16/22 0531  ALBUMIN 2.2* 2.1* 2.1* 1.9* 2.5*   No results for input(s): "LIPASE", "AMYLASE" in the last 168 hours. No results for input(s): "AMMONIA" in the last 168 hours.  CBC: Recent Labs  Lab 12/12/22 0512 12/13/22 0428 12/14/22 0440 12/15/22 0521 12/16/22 0531  WBC 49.8* 37.5* 40.2* 22.6* 18.2*  HGB 8.8* 7.9* 7.9* 7.4* 7.1*  HCT 28.5* 25.5* 25.3* 25.2* 24.0*  MCV 87.4 90.1 87.5 91.3 92.0  PLT 280 351 328 353 385    Cardiac Enzymes: No results for input(s): "CKTOTAL", "CKMB", "CKMBINDEX", "TROPONINI" in the last 168 hours.  BNP: Invalid input(s): "POCBNP"  CBG: Recent Labs  Lab 12/15/22 1931 12/16/22 0013 12/16/22 0331 12/16/22 0726 12/16/22 1130  GLUCAP 135* 144* 141* 138* 145*    Microbiology: Results for orders placed or performed during the hospital encounter of 12/05/22  Culture, blood (Routine x 2)     Status: Abnormal   Collection Time: 12/05/22  8:56 PM   Specimen: BLOOD  Result Value Ref Range Status   Specimen Description   Final    BLOOD BLOOD RIGHT ARM Performed at [facility], [address] [place], [place] [address]    Special  Requests   Final    BOTTLES DRAWN AEROBIC AND ANAEROBIC Blood Culture adequate volume Performed at [facility], [address] [place], [place] [address]    Culture  Setup Time   Final    GRAM POSITIVE COCCI IN BOTH AEROBIC AND ANAEROBIC BOTTLES CRITICAL RESULT CALLED TO, READ BACK BY AND VERIFIED WITH: SHEEMA HALLLAJI AT 1206 12/06/22.PMF Performed at [facility], [address] [place], [place] [address]    Culture (A)  Final    STAPHYLOCOCCUS AUREUS SUSCEPTIBILITIES PERFORMED ON PREVIOUS CULTURE WITHIN THE LAST 5 DAYS. Performed at [facility], [address] [place], [place] [address]    Report Status 12/08/2022 FINAL  Final  Culture, blood (Routine x 2)     Status: Abnormal   Collection Time: 12/05/22  8:57 PM   Specimen: BLOOD  Result Value Ref Range Status   Specimen Description   Final    BLOOD BLOOD LEFT ARM Performed at [facility], [address], [place], [place] [address]    Special Requests   Final    BOTTLES DRAWN AEROBIC AND ANAEROBIC Blood Culture adequate volume Performed at [facility], [address] [place], [place] [address]    Culture  Setup Time   Final    GRAM POSITIVE COCCI IN BOTH AEROBIC AND ANAEROBIC BOTTLES CRITICAL RESULT CALLED TO, READ BACK BY AND VERIFIED WITH: SHEEMA HALLAJI AT 1206 12/06/22.PMF Performed at [facility], [address], [place], [place] [address]    Culture METHICILLIN RESISTANT STAPHYLOCOCCUS AUREUS (A)  Final   Report Status 12/08/2022 FINAL  Final   Organism ID, Bacteria METHICILLIN RESISTANT STAPHYLOCOCCUS AUREUS  Final      Susceptibility   Methicillin resistant staphylococcus aureus - MIC*    CIPROFLOXACIN >=8 RESISTANT Resistant     ERYTHROMYCIN >=8 RESISTANT Resistant     GENTAMICIN <=0.5 SENSITIVE Sensitive     OXACILLIN >=4 RESISTANT Resistant     TETRACYCLINE <=1 SENSITIVE Sensitive     VANCOMYCIN <=0.5 SENSITIVE Sensitive     TRIMETH/SULFA >=320 RESISTANT Resistant     CLINDAMYCIN <=0.25 SENSITIVE Sensitive     RIFAMPIN <=0.5 SENSITIVE Sensitive     Inducible Clindamycin NEGATIVE Sensitive     LINEZOLID 2 SENSITIVE Sensitive     * METHICILLIN RESISTANT STAPHYLOCOCCUS AUREUS  Blood Culture ID Panel (Reflexed)     Status: Abnormal   Collection Time: 12/05/22  8:57 PM  Result Value Ref Range Status   Enterococcus faecalis NOT DETECTED NOT DETECTED Final   Enterococcus Faecium NOT DETECTED NOT DETECTED Final   Listeria monocytogenes NOT DETECTED NOT DETECTED Final   Staphylococcus species DETECTED (A) NOT DETECTED Final     Comment: CRITICAL RESULT CALLED TO, READ BACK BY AND VERIFIED WITH: SHEEMA HALLAJI AT 1206 12/06/22.PMF    Staphylococcus aureus (BCID) DETECTED (A) NOT DETECTED Final    Comment: Methicillin (oxacillin)-resistant Staphylococcus aureus (MRSA). MRSA is predictably resistant to beta-lactam antibiotics (except ceftaroline). Preferred therapy is vancomycin unless clinically contraindicated. Patient requires contact precautions if  hospitalized. CRITICAL RESULT CALLED TO, READ BACK BY AND VERIFIED WITH: SHEEMA HALLAJI AT 1206 12/06/22.PMF    Staphylococcus epidermidis DETECTED (A) NOT DETECTED Final    Comment: CRITICAL RESULT CALLED TO, READ BACK BY AND VERIFIED WITH: SHEEMA HALLAJI AT 1206 12/06/22.PMF    Staphylococcus lugdunensis NOT DETECTED NOT DETECTED Final   Streptococcus species NOT DETECTED NOT DETECTED Final   Streptococcus agalactiae NOT DETECTED NOT DETECTED Final   Streptococcus  pneumoniae NOT DETECTED NOT DETECTED Final   Streptococcus pyogenes NOT DETECTED NOT DETECTED Final   A.calcoaceticus-baumannii NOT DETECTED NOT DETECTED Final   Bacteroides fragilis NOT DETECTED NOT DETECTED Final   Enterobacterales NOT DETECTED NOT DETECTED Final   Enterobacter cloacae complex NOT DETECTED NOT DETECTED Final   Escherichia coli NOT DETECTED NOT DETECTED Final   Klebsiella aerogenes NOT DETECTED NOT DETECTED Final   Klebsiella oxytoca NOT DETECTED NOT DETECTED Final   Klebsiella pneumoniae NOT DETECTED NOT DETECTED Final   Proteus species NOT DETECTED NOT DETECTED Final   Salmonella species NOT DETECTED NOT DETECTED Final   Serratia marcescens NOT DETECTED NOT DETECTED Final   Haemophilus influenzae NOT DETECTED NOT DETECTED Final   Neisseria meningitidis NOT DETECTED NOT DETECTED Final   Pseudomonas aeruginosa NOT DETECTED NOT DETECTED Final   Stenotrophomonas maltophilia NOT DETECTED NOT DETECTED Final   Candida albicans NOT DETECTED NOT DETECTED Final   Candida auris NOT DETECTED  NOT DETECTED Final   Candida glabrata NOT DETECTED NOT DETECTED Final   Candida krusei NOT DETECTED NOT DETECTED Final   Candida parapsilosis NOT DETECTED NOT DETECTED Final   Candida tropicalis NOT DETECTED NOT DETECTED Final   Cryptococcus neoformans/gattii NOT DETECTED NOT DETECTED Final   Methicillin resistance mecA/C DETECTED (A) NOT DETECTED Final    Comment: CRITICAL RESULT CALLED TO, READ BACK BY AND VERIFIED WITH: SHEEMA HALLAJI AT 1206 12/06/22.PMF    Meth resistant mecA/C and MREJ DETECTED (A) NOT DETECTED Final    Comment: CRITICAL RESULT CALLED TO, READ BACK BY AND VERIFIED WITH: SHEEMA HALLAJI AT 1206 12/06/22.PMF Performed at [facility], [address] [place], [place] [address]   Resp panel by RT-PCR (RSV, Flu A&B, Covid) Anterior Nasal Swab     Status: None   Collection Time: 12/05/22  9:53 PM   Specimen: Anterior Nasal Swab  Result Value Ref Range Status   SARS Coronavirus 2 by RT PCR NEGATIVE NEGATIVE Final    Comment: (NOTE) SARS-CoV-2 target nucleic acids are NOT DETECTED.  The SARS-CoV-2 RNA is generally detectable in upper respiratory specimens during the acute phase of infection. The lowest concentration of SARS-CoV-2 viral copies this assay can detect is 138 copies/mL. A negative result does not preclude SARS-Cov-2 infection and should not be used as the sole basis for treatment or other patient management decisions. A negative result may occur with  improper specimen collection/handling, submission of specimen other than nasopharyngeal swab, presence of viral mutation(s) within the areas targeted by this assay, and inadequate number of viral copies(<138 copies/mL). A negative result must be combined with clinical observations, patient history, and epidemiological information. The expected result is Negative.  Fact Sheet for Patients:  BloggerCourse.com  Fact Sheet for Healthcare Providers:   SeriousBroker.it  This test is no t yet approved or cleared by the [place] FDA and  has been authorized for detection and/or diagnosis of SARS-CoV-2 by FDA under an Emergency Use Authorization (EUA). This EUA will remain  in effect (meaning this test can be used) for the duration of the COVID-19 declaration under Section 564(b)(1) of the Act, 21 U.S.C.section 360bbb-3(b)(1), unless the authorization is terminated  or revoked sooner.       Influenza A by PCR NEGATIVE NEGATIVE Final   Influenza B by PCR NEGATIVE NEGATIVE Final    Comment: (NOTE) The Xpert Xpress SARS-CoV-2/FLU/RSV plus assay is intended as an aid in the diagnosis of influenza from Nasopharyngeal swab specimens and should not be used as a sole  basis for treatment. Nasal washings and aspirates are unacceptable for Xpert Xpress SARS-CoV-2/FLU/RSV testing.  Fact Sheet for Patients: BloggerCourse.com  Fact Sheet for Healthcare Providers: SeriousBroker.it  This test is not yet approved or cleared by the [place] FDA and has been authorized for detection and/or diagnosis of SARS-CoV-2 by FDA under an Emergency Use Authorization (EUA). This EUA will remain in effect (meaning this test can be used) for the duration of the COVID-19 declaration under Section 564(b)(1) of the Act, 21 U.S.C. section 360bbb-3(b)(1), unless the authorization is terminated or revoked.     Resp Syncytial Virus by PCR NEGATIVE NEGATIVE Final    Comment: (NOTE) Fact Sheet for Patients: BloggerCourse.com  Fact Sheet for Healthcare Providers: SeriousBroker.it  This test is not yet approved or cleared by the [place] FDA and has been authorized for detection and/or diagnosis of SARS-CoV-2 by FDA under an Emergency Use Authorization (EUA). This EUA will remain in effect (meaning this test can be used) for  the duration of the COVID-19 declaration under Section 564(b)(1) of the Act, 21 U.S.C. section 360bbb-3(b)(1), unless the authorization is terminated or revoked.  Performed at [facility], [address] [place], [place] [address]   MRSA Next Gen by PCR, Nasal     Status: Abnormal   Collection Time: 12/06/22  4:27 AM   Specimen: Nasal Mucosa; Nasal Swab  Result Value Ref Range Status   MRSA by PCR Next Gen DETECTED (A) NOT DETECTED Final    Comment: RESULT CALLED TO, READ BACK BY AND VERIFIED WITH: KRISTINE CHAMBERS AT 2440 12/06/22.PMF (NOTE) The GeneXpert MRSA Assay (FDA approved for NASAL specimens only), is one component of a comprehensive MRSA colonization surveillance program. It is not intended to diagnose MRSA infection nor to guide or monitor treatment for MRSA infections. Test performance is not FDA approved in patients less than 29 years old. Performed at [facility], [address], [place], [place] [address]   Cath Tip Culture     Status: Abnormal   Collection Time: 12/06/22  2:22 PM   Specimen: Catheter Tip; Other  Result Value Ref Range Status   Specimen Description   Final    CATH TIP Performed at [facility], [address] [place], [place] [address]    Special Requests   Final    NONE Performed at [facility], [address] [place], [place] [address]    Culture (A)  Final    >=100,000 COLONIES/mL METHICILLIN RESISTANT STAPHYLOCOCCUS AUREUS   Report Status 12/08/2022 FINAL  Final   Organism ID, Bacteria METHICILLIN RESISTANT STAPHYLOCOCCUS AUREUS (A)  Final      Susceptibility   Methicillin resistant staphylococcus aureus - MIC*    CIPROFLOXACIN >=8 RESISTANT Resistant     ERYTHROMYCIN >=8 RESISTANT Resistant     GENTAMICIN <=0.5 SENSITIVE Sensitive     OXACILLIN >=4 RESISTANT Resistant     TETRACYCLINE <=1 SENSITIVE Sensitive     VANCOMYCIN <=0.5 SENSITIVE Sensitive     TRIMETH/SULFA >=320 RESISTANT  Resistant     CLINDAMYCIN <=0.25 SENSITIVE Sensitive     RIFAMPIN <=0.5 SENSITIVE Sensitive     Inducible Clindamycin NEGATIVE Sensitive     LINEZOLID 2 SENSITIVE Sensitive     * >=100,000 COLONIES/mL METHICILLIN RESISTANT STAPHYLOCOCCUS AUREUS  Urine Culture (for pregnant, neutropenic or urologic patients or patients with an indwelling urinary catheter)     Status: Abnormal   Collection Time: 12/06/22  5:41 PM   Specimen: Urine, Clean Catch  Result  Value Ref Range Status   Specimen Description   Final    URINE, CLEAN CATCH Performed at [facility], [address] [place], [place] [address]    Special Requests   Final    Normal Performed at [facility], [address] [place], [place] [address]    Culture (A)  Final    70,000 COLONIES/mL ENTEROCOCCUS FAECALIS 70,000 COLONIES/mL YEAST    Report Status 12/09/2022 FINAL  Final   Organism ID, Bacteria ENTEROCOCCUS FAECALIS (A)  Final      Susceptibility   Enterococcus faecalis - MIC*    AMPICILLIN <=2 SENSITIVE Sensitive     NITROFURANTOIN <=16 SENSITIVE Sensitive     VANCOMYCIN 1 SENSITIVE Sensitive     * 70,000 COLONIES/mL ENTEROCOCCUS FAECALIS  Culture, blood (Routine X 2) w Reflex to ID Panel     Status: None   Collection Time: 12/07/22  1:31 PM   Specimen: BLOOD  Result Value Ref Range Status   Specimen Description BLOOD BLOOD RIGHT HAND  Final   Special Requests   Final    BOTTLES DRAWN AEROBIC ONLY Blood Culture adequate volume   Culture   Final    NO GROWTH 5 DAYS Performed at [facility], [address] [place], [place] [address]    Report Status 12/12/2022 FINAL  Final  Culture, blood (Routine X 2) w Reflex to ID Panel     Status: None   Collection Time: 12/07/22  6:41 PM   Specimen: BLOOD  Result Value Ref Range Status   Specimen Description BLOOD BLOOD RIGHT HAND  Final   Special Requests   Final    BOTTLES DRAWN AEROBIC AND ANAEROBIC Blood Culture results may not be optimal  due to an inadequate volume of blood received in culture bottles   Culture   Final    NO GROWTH 5 DAYS Performed at [facility], [address] [place], [place] [address]    Report Status 12/12/2022 FINAL  Final  C Difficile Quick Screen (NO PCR Reflex)     Status: None   Collection Time: 12/12/22  4:07 PM   Specimen: STOOL  Result Value Ref Range Status   C Diff antigen NEGATIVE NEGATIVE Final   C Diff toxin NEGATIVE NEGATIVE Final   C Diff interpretation No C. difficile detected.  Final    Comment: Performed at [facility], [address] [place], [place] [address]  Gastrointestinal Panel by PCR , Stool     Status: None   Collection Time: 12/13/22  8:38 AM   Specimen: Stool  Result Value Ref Range Status   Campylobacter species NOT DETECTED NOT DETECTED Final   Plesimonas shigelloides NOT DETECTED NOT DETECTED Final   Salmonella species NOT DETECTED NOT DETECTED Final   Yersinia enterocolitica NOT DETECTED NOT DETECTED Final   Vibrio species NOT DETECTED NOT DETECTED Final   Vibrio cholerae NOT DETECTED NOT DETECTED Final   Enteroaggregative E coli (EAEC) NOT DETECTED NOT DETECTED Final   Enteropathogenic E coli (EPEC) NOT DETECTED NOT DETECTED Final   Enterotoxigenic E coli (ETEC) NOT DETECTED NOT DETECTED Final   Shiga like toxin producing E coli (STEC) NOT DETECTED NOT DETECTED Final   Shigella/Enteroinvasive E coli (EIEC) NOT DETECTED NOT DETECTED Final   Cryptosporidium NOT DETECTED NOT DETECTED Final   Cyclospora cayetanensis NOT DETECTED NOT DETECTED Final   Entamoeba histolytica NOT DETECTED NOT DETECTED Final   Giardia lamblia NOT DETECTED NOT DETECTED Final   Adenovirus F40/41  NOT DETECTED NOT DETECTED Final   Astrovirus NOT DETECTED NOT DETECTED Final   Norovirus GI/GII NOT DETECTED NOT DETECTED Final   Rotavirus A NOT DETECTED NOT DETECTED Final   Sapovirus (I, II, IV, and V) NOT DETECTED NOT DETECTED Final    Comment: Performed at [facility], [address] [place], [place] [address]    Coagulation Studies: No results for input(s): "LABPROT", "INR" in the last 72 hours.   Urinalysis: No results for input(s): "COLORURINE", "LABSPEC", "PHURINE", "GLUCOSEU", "HGBUR", "BILIRUBINUR", "KETONESUR", "PROTEINUR", "UROBILINOGEN", "NITRITE", "LEUKOCYTESUR" in the last 72 hours.  Invalid input(s): "APPERANCEUR"     Imaging: No results found.   Medications:    sodium chloride Stopped (12/15/22 0035)   sodium chloride Stopped (12/08/22 1823)   anticoagulant sodium citrate     copper chloride 2 mg in dextrose 5 % 100 mL IVPB 2 mg (12/16/22 1152)   norepinephrine (LEVOPHED) Adult infusion 6 mcg/min (12/16/22 1210)   vancomycin 1,000 mg (12/16/22 1355)    sodium chloride   Intravenous Once   amiodarone  200 mg Per Tube BID   ascorbic acid  500 mg Per Tube BID   atorvastatin  40 mg Per Tube Daily   chlorhexidine  15 mL Mouth Rinse BID   Chlorhexidine Gluconate Cloth  6 each Topical Q0600   vitamin D3  2,000 Units Per Tube Daily   [START ON 12/18/2022] copper  2 mg Per Tube Daily   droxidopa  300 mg Per Tube TID WC   epoetin (EPOGEN/PROCRIT) injection  10,000 Units Intravenous Q T,Th,Sa-HD   escitalopram  10 mg Per Tube Daily   famotidine  10 mg Per Tube Daily   feeding supplement (NEPRO CARB STEADY)  1,000 mL Per Tube Q24H   feeding supplement (PROSource TF20)  60 mL Per Tube Daily   fludrocortisone  0.2 mg Per Tube Daily   free water  30 mL Per Tube Q4H   Gerhardt's butt cream   Topical BID   heparin injection (subcutaneous)  5,000 Units Subcutaneous Q8H   hydrOXYzine  50 mg Per Tube Q6H   insulin aspart  0-6 Units Subcutaneous Q4H   midodrine  20 mg Per Tube TID WC   multivitamin  1 tablet Per Tube QHS   mupirocin ointment   Nasal BID   nutrition supplement (JUVEN)  1 packet Per Tube BID BM   mouth rinse  15 mL Mouth Rinse 4 times per day   sodium chloride, acetaminophen **OR** acetaminophen, alteplase,  alteplase, anticoagulant sodium citrate, artificial tears, clonazePAM, fentaNYL (SUBLIMAZE) injection, heparin, heparin, ipratropium-albuterol, lidocaine (PF), lidocaine-prilocaine, loperamide HCl, magnesium hydroxide, ondansetron **OR** ondansetron (ZOFRAN) IV, mouth rinse, pentafluoroprop-tetrafluoroeth, sennosides, traZODone  Assessment/ Plan:  Ms. Donnett Eimers is a 72 y.o.  female with past medical history of diabetes, COPD, GERD, hypertension. OA, a fib, and end stage renal disease on hemodialysis. Patient presents to the ED from her nursing facility complaining of altered mental status. She has been admitted for Lower urinary tract infectious disease [N39.0] Altered mental status, unspecified altered mental status type [R41.82] Sepsis due to gram-negative UTI (HCC) [A41.50, N39.0] Sepsis, due to unspecified organism, unspecified whether acute organ dysfunction present (HCC) [A41.9]  [facility]/TTS/Rt Permcath/? 81.3kg  End stage renal disease -Hemodialysis via femoral temp cath TTS schedule. -PermCath placement deferred until WBC count normalizes and infections are cleared. -Some dependent edema present.  UF goal as tolerated  -Significant hypoalbuminemia.  May need IV albumin supplementation during dialysis for hemodynamic stability.  2. Hypotension due to sepsis. Blood cultures and dialysis cath tip cultures with MRSA. Repeat cultures with no growth. However now with significant leukocytosis.   - requiring vasopressors: norepinephrine.   - continue antibiotics: vancomycin.  Pharmacy assisting with dosing. ID following.    3. Anemia of chronic kidney disease : PRBC transfusion on 8/26, 9/3  - EPO with HD treatments  - Hgb 7.1 today  -Getting blood transfusion during dialysis today.  4. Secondary Hyperparathyroidism:  with hypophosphatemia.   -Phosphorus level has now improved.     LOS: 10 Adante [place] 9/3/20242:41 PM

## 2022-12-16 NOTE — Progress Notes (Signed)
Hemodialysis note  Received patient at bedside in ICU. Alert and oriented to person, place and situation.  Informed consent signed and in chart.  Treatment initiated: 1156 Treatment completed: 1505  Patient tolerated well. Alert without acute distress.  Report given to patient's RN.   Access used: Right Femoral HD Catheter Access issues: none  Total UF removed: 500 ml Medication(s) given:  Epogen 10 000 units IV and Vancomycin 1000mg  IV  1 unit of RBC was transfused during HD. Patient was having itchiness prior to blood administration. No signs and symptoms of transfusion reaction noted.  Post HD VS: BP: 113/58  Post HD weight: 89.8 kg   Alexandra Foster [facility]

## 2022-12-16 NOTE — Progress Notes (Signed)
OT Cancellation Note  Patient Details Name: Alexandra Foster MRN: [id number] DOB: [birth year]   Cancelled Treatment:    Reason Eval/Treat Not Completed: Other (comment). Pt receiving dialysis and blood transfusion. Will re-attempt OT evaluation at later date/time as appropriate.   Arman Filter., MPH, MS, OTR/L ascom [phone] 12/16/22, 1:35 PM

## 2022-12-16 NOTE — Progress Notes (Addendum)
   [facility] has been requested to perform a transesophageal echocardiogram on [facility] for bacteremia.  After careful review of history and examination, the risks and benefits of transesophageal echocardiogram have been explained including risks of esophageal damage, perforation (1:10,000 risk), bleeding, pharyngeal hematoma as well as other potential complications associated with conscious sedation including aspiration, arrhythmia, respiratory failure and death. Alternatives to treatment were discussed, questions were answered. Patient is willing to proceed.   Nicolasa Ducking, NP  12/16/2022 2:30 PM    Addendum:  After being alerted by RN that dtr and husband would like to talk about TEE, I called into the room and spoke to both of them.  They have concerns re: sedation and ongoing need for norepinephrine and at this time, they do not want Ms. Ringley to proceed with TEE in the AM.  They understand that without TEE, the ID team will have to extend the duration of abx.  The were grateful for the discussion and I advised that I would cancel TEE for the AM.  Nicolasa Ducking, NP 12/16/2022, 6:30 PM

## 2022-12-16 NOTE — Plan of Care (Signed)
°  Problem: Fluid Volume: °Goal: Hemodynamic stability will improve °Outcome: Progressing °  °Problem: Clinical Measurements: °Goal: Diagnostic test results will improve °Outcome: Progressing °Goal: Signs and symptoms of infection will decrease °Outcome: Progressing °  °

## 2022-12-16 NOTE — TOC Progression Note (Signed)
Transition of Care [facility]) - Progression Note    Patient Details  Name: Alexandra Foster MRN: [id number] Date of Birth: 1950/10/06  Transition of Care [facility] CM/SW Contact  Kreg Shropshire, RN Phone Number: 12/16/2022, 1:23 PM  Clinical Narrative:     CM continue to follow for toc needs.  Expected Discharge Plan: Skilled Nursing Facility Barriers to Discharge: Continued Medical Work up  Expected Discharge Plan and Services     Post Acute Care Choice: Skilled Nursing Facility Living arrangements for the past 2 months: Skilled Nursing Facility                                       Social Determinants of Health (SDOH) Interventions SDOH Screenings   Food Insecurity: Patient Unable To Answer (12/06/2022)  Housing: High Risk (12/06/2022)  Transportation Needs: No Transportation Needs (12/06/2022)  Utilities: Not At Risk (12/06/2022)  Alcohol Screen: Low Risk  (02/11/2021)  Depression (PHQ2-9): Low Risk  (04/30/2021)  Financial Resource Strain: Medium Risk (08/09/2020)  Physical Activity: Inactive (08/09/2020)  Social Connections: Moderately Integrated (08/09/2020)  Stress: No Stress Concern Present (08/09/2020)  Tobacco Use: Medium Risk (12/05/2022)    Readmission Risk Interventions     No data to display

## 2022-12-16 NOTE — Progress Notes (Signed)
NAME:  Alexandra Foster, MRN:  [id number], DOB:  03-10-1951, LOS: 10 ADMISSION DATE:  12/05/2022, CONSULTATION DATE:  12/06/2022 REFERRING MD:  Dr. Wynelle Link, CHIEF COMPLAINT:  Hypotension   Brief Pt Description / Synopsis:  72 y.o female with PMHx significant for ESRD on HD admitted with Acute Metabolic Encephalopathy in setting of Severe Sepsis with Septic Shock due to MRSA Bacteremia from infected right chest Permcath, along with Enterococcus Faecalis UTI.  History of Present Illness:  Alexandra Foster is a 72 y.o. female with past medical history significant for asthma, COPD, type diabetes mellitus, s/p G-tube, GERD, ESRD on HD, hypertension, osteoarthritis, paroxysmal atrial fibrillation and OSA on CPAP, who presented to [facility] ED on 12/05/22 from [facility] due to altered mental status with lethargy and decreased responsiveness.  There was a concern about aspiration pneumonia at his facility as the patient had an episode of nausea and vomiting and was reportedly coughing per her sister without wheezing or dyspnea or hypoxia.  The patient's temperature was 102.2 when EMS arrived to this facility.  The patient was not responding to questions throughout transport.  EMS noted the patient having slurred speech and significant weakness per family (family had not seen her since last week).  Code stroke was called upon arrival to the ED. teleneurology evaluated the patient, was not considered a candidate for thrombolytics given unclear last known well time, along with low suspicion for stroke given significantly global symptoms and multiple metabolic derangements and sepsis contributing to encephalopathy.  ED Course: Initial Vital Signs: Temperature 101.4 F orally, pulse 78, respiratory rate 19, blood pressure 125/70, SpO2 99% on room air Significant Labs: hyponatremia 128 and hypochloremia of 90 with blood glucose of 197, BUN of 67 and creatinine 4.53 with CO2 of 22 and anion gap 16. Alk phos was  157 and albumin 3.3, AST 66 and ALT 87 with total obtained of 8.6. Lactic acid was 2.3 and later 2.9. CBC showed leukocytosis of 21.4 with neutrophilia and anemia. Respiratory panel including influenza A, B, RSV and COVID-19 PCR came back negative. UA was positive for UTI alcohol level was less than 10. Urine drug screen was positive for tricyclic's.  Imaging Chest X-ray>>IMPRESSION: Mild vascular congestion likely related to volume overload. CT Head w/o contrast>>IMPRESSION: 1. No acute intracranial abnormality. 2. ASPECTS is 10. Medications Administered: IV Rocephin and Zithromax as well as 3 L bolus of IV normal saline   Hospitalist were asked to admit for further workup and treatment.  Nephrology consulted for hemodialysis.  Please see "significant hospital events" section below for full detailed hospital course.  Pertinent  Medical History   Past Medical History:  Diagnosis Date   AKI (acute kidney injury) (HCC)    a. 04/2021 in setting of bacteremia/shock.   Arthritis    Asthma    Bacteremia    a. 04/2021 S pyogenes bacteremia in setting of lower ext cellulitis.   COPD (chronic obstructive pulmonary disease) (HCC)    Diabetes mellitus without complication (HCC)    Endometriosis    GERD (gastroesophageal reflux disease)    History of echocardiogram    a. 07/2013 Echo: EF 55-60%, impaired relaxation, mild TR; b. 04/2021 Echo: EF 50-55%, mild LVH, nl RV fxn, mild BAE, Ao sclerosis w/o stenosis.   Hypertension    Obesity    PAF (paroxysmal atrial fibrillation) (HCC)    a. 04/2021 in setting of septic shock/cellulitis.   Sleep apnea    CPAP    Micro Data:  8/23:  SARS-CoV-2/RSV/Flu PCR>>negative 8/23: Blood cultures>> 4/4 bottles with MRSA & Staph epidermidis 8/23: MRSA PCR>> positive 8/23: Urine>> Enterococcus Faecalis 8/24: Permcath tip>> MRSA 8/25: Repeat Blood cultures>> no growth 8/30: Cdiff>>negative  8/31: GI Panel by PCR>>negative   Antimicrobials:    Anti-infectives (From admission, onward)    Start     Dose/Rate Route Frequency Ordered Stop   12/13/22 1800  vancomycin (VANCOCIN) IVPB 1000 mg/200 mL premix        1,000 mg 200 mL/hr over 60 Minutes Intravenous  Once 12/13/22 1158 12/13/22 1820   12/11/22 2345  Ampicillin-Sulbactam (UNASYN) 3 g in sodium chloride 0.9 % 100 mL IVPB  Status:  Discontinued        3 g 200 mL/hr over 30 Minutes Intravenous Every 12 hours 12/11/22 2252 12/15/22 2041   12/11/22 1800  vancomycin (VANCOCIN) IVPB 1000 mg/200 mL premix        1,000 mg 200 mL/hr over 60 Minutes Intravenous  Once 12/11/22 1025 12/12/22 0635   12/09/22 1800  vancomycin (VANCOREADY) IVPB 750 mg/150 mL        750 mg 150 mL/hr over 60 Minutes Intravenous  Once 12/09/22 1423 12/09/22 1848   12/08/22 1040  vancomycin variable dose per unstable renal function (pharmacist dosing)         Does not apply See admin instructions 12/08/22 1041     12/07/22 1400  vancomycin (VANCOCIN) IVPB 1000 mg/200 mL premix  Status:  Discontinued        1,000 mg 200 mL/hr over 60 Minutes Intravenous Every 24 hours 12/06/22 1234 12/08/22 1041   12/06/22 2200  cefTRIAXone (ROCEPHIN) 2 g in sodium chloride 0.9 % 100 mL IVPB  Status:  Discontinued        2 g 200 mL/hr over 30 Minutes Intravenous Every 24 hours 12/06/22 0132 12/06/22 1223   12/06/22 1500  ceFEPIme (MAXIPIME) 2 g in sodium chloride 0.9 % 100 mL IVPB  Status:  Discontinued        2 g 200 mL/hr over 30 Minutes Intravenous Every 12 hours 12/06/22 1252 12/08/22 1040   12/06/22 1315  vancomycin (VANCOREADY) IVPB 1750 mg/350 mL        1,750 mg 175 mL/hr over 120 Minutes Intravenous  Once 12/06/22 1224 12/06/22 1724   12/05/22 2130  cefTRIAXone (ROCEPHIN) 2 g in sodium chloride 0.9 % 100 mL IVPB  Status:  Discontinued        2 g 200 mL/hr over 30 Minutes Intravenous Every 24 hours 12/05/22 2115 12/06/22 0202   12/05/22 2130  azithromycin (ZITHROMAX) 500 mg in sodium chloride 0.9 % 250 mL IVPB   Status:  Discontinued        500 mg 250 mL/hr over 60 Minutes Intravenous Every 24 hours 12/05/22 2115 12/06/22 1223        Significant Hospital Events: Including procedures, antibiotic start and stop dates in addition to other pertinent events   8/23: Presented from [facility] due to AMS, Code Stroke called due to slurred speech and weakness. Deemed not a candidate for thrombolytics due to no clear last known normal time.  Admitted by [facility] with Nephrology consultation for dialysis. 8/24: Rapid response called due to Hypotension prior to/during Hemodialysis.  Transfer to ICU for peripheral vasopressors and initiation of CRRT.  PCCM consulted to assist with vasopressors.  Blood cultures + for MRSA & Staph Epi. ID consulted. Vascular Surgery consulted, permcath removed.  Temporary HD catheter placed. 8/24: this morning with pronounce left facial droop and  dysarthria, remains on CRRT on Levo @8  keep MAP>55 12/08/22: patient with multispecies sepsis, removing central line today due to bacteremia. Holding HD as per nephrology team due to shock.  12/09/22-patient with loose watery BM today, she does have skin breakdown over abd pannus.  She had re-initiation of her psychiatric medications today.  S/p pRBC transfusion overnight. Electrolytes are being repleted.  On room air but requires levophed still.  12/10/22- patient laying in bed with no acute distress.  Levophed weaned to 76mcg/kg/min, afebrile on room air.  Handling NGT feeds well.  Purewick with no UOP this am. 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  12/11/22- patient off levophed , requires HD today, will place catheter per renal team.  Wound care to see for anal wound.  With episode of emesis during HD and concern for possible aspiration. 12/12/22- Back on low dose Levophed, weaning as able. Worsening Leukocytosis, ID plans to check for C.diff.  Ongoing discussions of when to remove newly placed temporary HD catheter. 12/13/22- Remains on  levophed gtt despite scheduled midodrine and droxidopa.  HD session completed 12/14/22- No acute events overnight pt remains on levophed gtt.  Received HD. 12/15/22- No acute events overnight, remains on Levophed.  Increase Droxidopa to 300 mg TID. Leukocytosis improving. 12/16/22- No events overnight.  Remains on Levophed (less than yesterday, down to 5 mcg).  Will give 1 unit blood with HD today for Hgb 7.1.  Increase Midodrine to 20 mg TID.  Consult Cardiology to evaluate for potential TEE.  Interim History / Subjective:  -No significant events noted overnight -Afebrile, on room air, remains on low dose Levophed (5 mcg) which is decreased from yesterday -Plan for HD today ~ will transfuse 1 unit blood with HD for Hgb 7.1 -Leukocytosis improved to 18.2 from 22.6  -Will consult Cardiology to evaluate for potential TEE given bacteremia  Objective   Blood pressure (!) 106/48, pulse 65, temperature 98.2 F (36.8 C), temperature source Oral, resp. rate (!) 22, height 5\' 4"  (1.626 m), weight 92.6 kg, SpO2 95%.        Intake/Output Summary (Last 24 hours) at 12/16/2022 0758 Last data filed at 12/16/2022 0635 Gross per 24 hour  Intake 2068.16 ml  Output 775 ml  Net 1293.16 ml   Filed Weights   12/14/22 0500 12/15/22 0701 12/16/22 0500  Weight: 93.4 kg 92.6 kg 92.6 kg    Examination: General: Acute on chronically ill-appearing obese female, laying in bed, on room air, no acute distress HENT: Atraumatic, normocephalic, neck supple, difficult to assess JVD due to body habitus Lungs: Diminished distant breath sounds throughout, even, non labored, normal effort Cardiovascular: NSR, s1s2, no m/r/g, 2+ radial/1+ distal pulses, 1+ lower extremity edema  Abdomen: +BS x4, obese, soft, non tender, non distended, PEG tube in place with dressing clean dry and intact Extremities: Prior left AKA, anasarca Neuro: Alert, disoriented to time and situation, following commands, moves extremities to command (mild  to moderate dysarthria, slight left facial droop which family reports is baseline) GU: Purewick in place   Resolved Hospital Problem list     Assessment & Plan:   #Septic Shock #Chronic Hypotension (on midodrine outpatient) PMHx: Paroxsymal Atrial Fibrillation, HTN Echocardiogram 12/07/2022: LVEF 60-65%, RV systolic function low normal, RV size mildly enlarged, trivial mitral regurgitation - Continuous cardiac monitoring - Maintain MAP >55 per Nephrology - Vasopressors as needed to maintain MAP goal - Increase Midodrine to 20 mg TID, continue Droxidopa to 300 mg TID - Lactic acid previously normalized ~  will repeat today - Repeat Cortisol is 15.5 (previously 35.9) ~ continue Florinef - TSH within normal limits   - Continue outpatient amiodarone  #Severe Sepsis due to MRSA BACTEREMIA due to infected Permcath & Enterococcus Faecalis UTI #Suspected Aspiration ~ TREATED Permcath was removed on 8/24 2D Echocardiogram without evidence of vegetations - Trend WBC and monitor fever curve  - Trend PCT  - Follow cultures  -ID following appreciate input ~continue vancomycin pending cultures & sensitivities (completed 5 days of Unasyn) - ID recommends TEE ~ but pt is extremely high risk given hypotension, would be difficult to wean from ventilator, but will have Cardiology evaluate today ~ recommends 6 weeks of Vancomycin if unable to undergo TEE - Ongoing discussions of when to remove newly placed Temporary femoral HD catheter (placed on 8/29)  #ESRD on Hemodialysis #Mild Hyponatremia ~ RESOLVED - Trend BMP  - Ensure adequate renal perfusion - Avoid nephrotoxic agents as able - Monitor I&O's / urinary output - Replace electrolytes as indicated ~ Pharmacy following for assistance with electrolyte replacement - Nephrology following, appreciate input: HD per recommendations  #COPD without acute exacerbation #Concern for Aspiration ~ TREATED #OSA, uses CPAP at home - Supplemental O2 as  needed to maintain O2 sats 88 to 92% - CPAP qhs - Follow intermittent Chest X-ray & ABG as needed - Bronchodilators prn - Pulmonary toilet as able - ABX as above  #Anemia of chronic kidney disease - Trend CBC - Monitor for s/sx of bleeding  - VTE prophylaxis: subcutaneous heparin  - Transfuse for Hgb <7 ~ Transfuse 1 unit pRBC's on 9/3  #Diarrhea ~ IMPROVED Cdiff 08/30: negative  - Continue rectal tube  - Prn imodium  #Type 2 diabetes mellitus - CBG's q4h; Target range of 140 to 180 - SSI - Follow ICU Hypo/Hyperglycemia protocol - Hold outpatient metformin  #Acute metabolic encephalopathy in setting of sepsis and multiple metabolic derangements~improving  #? TIA (left sided weakness, left facial droop) PMHx: Depression and Anxiety CT Head 8/23 negative for acute intracranial abnormality Code stroke called 8/23, deemed not a candidate for thrombolytics given unclear last known well time CTA head and neck reviewed and shows no acute intracranial abnormality or LVO -Evaluated by inpatient Neurology on 8/25 ~ Recommendations included: "-Appreciate neurosurgery confirmation that anticoagulation can be resumed from their perspective, secure chat sent to neurosurgeon on-call -Echocardiogram pending, may need TEE, if TTE is negative to definitively exclude endocarditis (defer to primary team, cardiology and infectious disease on risk/benefit) -Anticoagulation for stroke risk prevention once endocarditis is ruled out or resolved, and after neurosurgical clearance -LDL goal less than 70, A1c goal less than 7% (A1c and lipid panel ordered), please adjust medications as needed -Carotid ultrasound -Outpatient follow-up for OSA/CPAP versus inpatient trial as this can increase stroke risk if untreated -Outpatient follow-up for pancreatic lesion as malignancy can increase stroke risk -Outpatient follow-up for IVC filter, could potentially be removed if anticoagulation is reinstituted" -  Treatment of sepsis and metabolic derangements as outlined above - Provide supportive care - Promote normal sleep/wake cycle and family presence - Avoid sedating medications as able - Continue outpatient hydroxyzine, lexapro, and prn klonopin - PT/OT      Patient is critically ill with bacteremia and shock.  Prognosis is guarded, high risk for further decompensation, cardiac arrest and death.  Given current illness superimposed on advanced age and multiple chronic comorbidities, overall long-term prognosis is poor.  Recommend DNR status.  Palliative Care was consulted but has since signed off as  pt and family very clear they wish for full code and all aggressive measures.  Best Practice (right click and "Reselect all SmartList Selections" daily)   Diet/type: NPO, tube feeds DVT prophylaxis: prophylactic heparin  GI prophylaxis: Pepcid Lines: Right femoral trialysis and is still needed Foley:  N/A Code Status:  full code Last date of multidisciplinary goals of care discussion [12/16/22]  9/03: Updated pts daughter at bedside   Labs   CBC: Recent Labs  Lab 12/12/22 0512 12/13/22 0428 12/14/22 0440 12/15/22 0521 12/16/22 0531  WBC 49.8* 37.5* 40.2* 22.6* 18.2*  HGB 8.8* 7.9* 7.9* 7.4* 7.1*  HCT 28.5* 25.5* 25.3* 25.2* 24.0*  MCV 87.4 90.1 87.5 91.3 92.0  PLT 280 351 328 353 385    Basic Metabolic Panel: Recent Labs  Lab 12/11/22 0557 12/12/22 0512 12/13/22 0428 12/14/22 0440 12/15/22 0521 12/16/22 0531  NA 139 138 132* 136 137 136  K 2.9* 3.9 3.4* 3.6 3.4* 3.4*  CL 105 96* 96* 98 101 99  CO2 23 25 24 27 29 27   GLUCOSE 132* 90 172* 160* 159* 148*  BUN 99* 67* 90* 43* 61* 75*  CREATININE 3.57* 2.42* 3.09* 1.72* 2.40* 2.77*  CALCIUM 8.9 8.9 8.4* 8.2* 8.1* 8.5*  MG 2.2 1.9  --  1.5* 2.7*  --   PHOS 1.7* 2.0* 3.6 2.3* 3.9 4.1   GFR: Estimated Creatinine Clearance: 19.7 mL/min (A) (by C-G formula based on SCr of 2.77 mg/dL (H)). Recent Labs  Lab 12/13/22 0428  12/14/22 0440 12/15/22 0521 12/16/22 0531  PROCALCITON  --  3.18  --   --   WBC 37.5* 40.2* 22.6* 18.2*    Liver Function Tests: Recent Labs  Lab 12/12/22 0512 12/13/22 0428 12/14/22 0440 12/15/22 0521 12/16/22 0531  ALBUMIN 2.2* 2.1* 2.1* 1.9* 2.5*   No results for input(s): "LIPASE", "AMYLASE" in the last 168 hours. No results for input(s): "AMMONIA" in the last 168 hours.  ABG    Component Value Date/Time   PHART 7.40 05/14/2021 0341   PCO2ART 45 05/14/2021 0341   PO2ART 129 (H) 05/14/2021 0341   HCO3 27.9 05/14/2021 0341   ACIDBASEDEF 12.8 (H) 05/11/2021 0603   O2SAT 98.9 05/14/2021 0341     Coagulation Profile: No results for input(s): "INR", "PROTIME" in the last 168 hours.   Cardiac Enzymes: No results for input(s): "CKTOTAL", "CKMB", "CKMBINDEX", "TROPONINI" in the last 168 hours.  HbA1C: Hgb A1c MFr Bld  Date/Time Value Ref Range Status  12/07/2022 02:13 AM 5.2 4.8 - 5.6 % Final    Comment:    (NOTE) Pre diabetes:          5.7%-6.4%  Diabetes:              >6.4%  Glycemic control for   <7.0% adults with diabetes   05/05/2021 04:53 AM 6.7 (H) 4.8 - 5.6 % Final    Comment:    (NOTE)         Prediabetes: 5.7 - 6.4         Diabetes: >6.4         Glycemic control for adults with diabetes: <7.0     CBG: Recent Labs  Lab 12/15/22 1556 12/15/22 1931 12/16/22 0013 12/16/22 0331 12/16/22 0726  GLUCAP 143* 135* 144* 141* 138*    Review of Systems:   Positives in BOLD: Gen: Denies fever, chills, weight change, fatigue, night sweats HEENT: Denies blurred vision, double vision, hearing loss, tinnitus, sinus congestion, rhinorrhea, sore throat, neck stiffness, dysphagia  PULM: Denies shortness of breath, cough, sputum production, hemoptysis, wheezing CV: Denies chest pain, edema, orthopnea, paroxysmal nocturnal dyspnea, palpitations GI: Denies abdominal pain, nausea, vomiting, diarrhea, hematochezia, melena, constipation, change in bowel  habits GU: Denies dysuria, hematuria, polyuria, oliguria, urethral discharge Endocrine: Denies hot or cold intolerance, polyuria, polyphagia or appetite change Derm: Denies rash, dry skin, scaling or peeling skin change Heme: Denies easy bruising, bleeding, bleeding gums Neuro: Denies headache, numbness, weakness, slurred speech, loss of memory or consciousness   Past Medical History:  She,  has a past medical history of AKI (acute kidney injury) (HCC), Arthritis, Asthma, Bacteremia, COPD (chronic obstructive pulmonary disease) (HCC), Diabetes mellitus without complication (HCC), Endometriosis, GERD (gastroesophageal reflux disease), History of echocardiogram, Hypertension, Obesity, PAF (paroxysmal atrial fibrillation) (HCC), and Sleep apnea.   Surgical History:   Past Surgical History:  Procedure Laterality Date   ABDOMINAL HYSTERECTOMY     APPLICATION OF WOUND VAC Left 05/17/2021   Procedure: APPLICATION OF WOUND VAC/WOUND VAC EXCHANGE-Matrix Myriad;  Surgeon: Carolan Shiver, MD;  Location: [facility] ORS;  Service: General;  Laterality: Left;   APPLICATION OF WOUND VAC  05/24/2021   Procedure: APPLICATION OF WOUND VAC;  Surgeon: Carolan Shiver, MD;  Location: [facility];  Service: General;;   APPLICATION OF WOUND VAC  05/10/2021   Procedure: APPLICATION OF WOUND VAC;  Surgeon: Carolan Shiver, MD;  Location: [facility];  Service: General;;   COLONOSCOPY  10/29/2006   Dr Servando Snare   COLONOSCOPY WITH PROPOFOL N/A 11/05/2016   Procedure: COLONOSCOPY WITH PROPOFOL;  Surgeon: Earline Mayotte, MD;  Location: [facility];  Service: Endoscopy;  Laterality: N/A;   INCISION AND DRAINAGE OF WOUND Left 05/24/2021   Procedure: IRRIGATION AND DEBRIDEMENT LEFT LEG;  Surgeon: Carolan Shiver, MD;  Location: [facility];  Service: General;  Laterality: Left;   INCISION AND DRAINAGE OF WOUND Left 05/10/2021   Procedure: IRRIGATION AND DEBRIDEMENT LEFT LEG;  Surgeon: Carolan Shiver, MD;   Location: [facility];  Service: General;  Laterality: Left;   IR FLUORO GUIDE CV LINE RIGHT  06/12/2021   IR PERC TUN PERIT CATH WO PORT S&I /IMAG  06/12/2021   IR REPLC GASTRO/COLONIC TUBE PERCUT W/FLUORO  06/12/2021   IVC FILTER INSERTION N/A 05/29/2021   Procedure: IVC FILTER INSERTION;  Surgeon: Renford Dills, MD;  Location: [facility];  Service: Cardiovascular;  Laterality: N/A;   MINOR GRAFT APPLICATION  05/24/2021   Procedure: Myriad Matrix  APPLICATION;  Surgeon: Carolan Shiver, MD;  Location: [facility];  Service: General;;   NASAL SINUS SURGERY  2002   Dr Chestine Spore   PEG PLACEMENT N/A 05/28/2021   Procedure: PERCUTANEOUS ENDOSCOPIC GASTROSTOMY (PEG) PLACEMENT;  Surgeon: Sung Amabile, DO;  Location: [facility];  Service: General;  Laterality: N/A;  TRAVEL CASE   TRACHEOSTOMY TUBE PLACEMENT N/A 05/17/2021   Procedure: TRACHEOSTOMY;  Surgeon: Geanie Logan, MD;  Location: [facility];  Service: ENT;  Laterality: N/A;   WOUND DEBRIDEMENT Left 05/07/2021   Procedure: DEBRIDEMENT WOUND;  Surgeon: Carolan Shiver, MD;  Location: [facility];  Service: General;  Laterality: Left;     Social History:   reports that she quit smoking about 21 years ago. Her smoking use included cigarettes. She started smoking about 61 years ago. She has a 80 pack-year smoking history. She quit smokeless tobacco use about 21 years ago.  Her smokeless tobacco use included snuff. She reports that she does not drink alcohol and does not use drugs.   Family History:  Her family  history includes Alcohol abuse in her brother; Breast cancer (age of onset: 20) in her sister; Congestive Heart Failure in her mother; Coronary artery disease (age of onset: 46) in her father; Heart disease in her brother; Varicose Veins in her brother.   Allergies No Known Allergies   Home Medications  Prior to Admission medications   Medication Sig Start Date End Date Taking? Authorizing Provider  amiodarone (PACERONE) 200 MG  tablet Place 1 tablet (200 mg total) into feeding tube 2 (two) times daily. 06/13/21  Yes Erin Fulling, MD  ascorbic acid (VITAMIN C) 500 MG tablet Place 1 tablet (500 mg total) into feeding tube 2 (two) times daily. 06/13/21  Yes Kasa, Wallis Bamberg, MD  busPIRone (BUSPAR) 5 MG tablet Take 5 mg by mouth 2 (two) times daily. 12/04/22  Yes [provider]  cholecalciferol (VITAMIN D) 25 MCG tablet Place 2 tablets (2,000 Units total) into feeding tube daily. 06/14/21  Yes Kasa, Wallis Bamberg, MD  clonazePAM (KLONOPIN) 0.5 MG tablet Take 0.25-0.5 mg by mouth 2 (two) times daily. 0.25 mg every morning and 0.5 mg at bedtime for Agitation 11/18/22  Yes [provider]  escitalopram (LEXAPRO) 10 MG tablet Place 1 tablet (10 mg total) into feeding tube daily. 06/14/21  Yes Erin Fulling, MD  hydrOXYzine (ATARAX) 50 MG tablet Take 100 mg by mouth 2 (two) times daily. 12/02/22  Yes [provider]  lip balm (BLISTEX) OINT Apply 1 application topically 3 (three) times daily. 06/13/21  Yes Erin Fulling, MD  loperamide (IMODIUM) 2 MG capsule Take 1 capsule (2 mg total) by mouth as needed for diarrhea or loose stools. 06/13/21  Yes Erin Fulling, MD  midodrine (PROAMATINE) 5 MG tablet Place 3 tablets (15 mg total) into feeding tube 3 (three) times daily with meals. 06/13/21  Yes Erin Fulling, MD  multivitamin (RENA-VIT) TABS tablet Place 1 tablet into feeding tube at bedtime. 06/13/21  Yes Erin Fulling, MD  omeprazole (PRILOSEC) 20 MG capsule Take 20 mg by mouth daily. 11/12/22  Yes [provider]  sennosides (SENOKOT) 8.8 MG/5ML syrup Place 10 mLs into feeding tube at bedtime as needed for moderate constipation. 06/13/21  Yes Erin Fulling, MD  artificial tears (LACRILUBE) OINT ophthalmic ointment Place into both eyes every 4 (four) hours as needed for dry eyes. Patient not taking: Reported on 12/06/2022 06/13/21   Erin Fulling, MD  chlorhexidine gluconate, MEDLINE KIT, (PERIDEX) 0.12 % solution 15 mLs by Mouth Rinse  route 2 (two) times daily. Patient not taking: Reported on 12/06/2022 06/13/21   Erin Fulling, MD  collagenase (SANTYL) ointment Apply topically daily. Patient not taking: Reported on 12/06/2022 06/14/21   Erin Fulling, MD  dextrose 5 % SOLN 100 mL with copper chloride 0.4 MG/ML SOLN 2 mg Inject 2 mg into the vein daily. 06/14/21   Erin Fulling, MD  ipratropium-albuterol (DUONEB) 0.5-2.5 (3) MG/3ML SOLN Take 3 mLs by nebulization 3 (three) times daily. Patient not taking: Reported on 12/06/2022 06/13/21   Erin Fulling, MD  lactobacillus (FLORANEX/LACTINEX) PACK Place 1 packet (1 g total) into feeding tube 3 (three) times daily with meals. Patient not taking: Reported on 12/06/2022 06/13/21   Erin Fulling, MD  pantoprazole sodium (PROTONIX) 40 mg Place 40 mg into feeding tube daily. Patient not taking: Reported on 12/06/2022 06/13/21   Erin Fulling, MD  Scheduled Meds:  amiodarone  200 mg Per Tube BID   ascorbic acid  500 mg Per Tube BID   atorvastatin  40 mg Per Tube Daily  chlorhexidine  15 mL Mouth Rinse BID   Chlorhexidine Gluconate Cloth  6 each Topical Q0600   vitamin D3  2,000 Units Per Tube Daily   [START ON 12/18/2022] copper  2 mg Per Tube Daily   droxidopa  300 mg Per Tube TID WC   epoetin (EPOGEN/PROCRIT) injection  10,000 Units Intravenous Q T,Th,Sa-HD   escitalopram  10 mg Per Tube Daily   famotidine  10 mg Per Tube Daily   feeding supplement (NEPRO CARB STEADY)  1,000 mL Per Tube Q24H   feeding supplement (PROSource TF20)  60 mL Per Tube Daily   fludrocortisone  0.2 mg Per Tube Daily   free water  30 mL Per Tube Q4H   Gerhardt's butt cream   Topical BID   heparin injection (subcutaneous)  5,000 Units Subcutaneous Q8H   hydrOXYzine  50 mg Per Tube Q6H   insulin aspart  0-6 Units Subcutaneous Q4H   midodrine  15 mg Per Tube TID WC   multivitamin  1 tablet Per Tube QHS   mupirocin ointment   Nasal BID   nutrition supplement (JUVEN)  1 packet Per Tube BID BM   mouth rinse  15 mL Mouth  Rinse 4 times per day   potassium chloride  20 mEq Per Tube Once   vancomycin variable dose per unstable renal function (pharmacist dosing)   Does not apply See admin instructions   Continuous Infusions:  sodium chloride Stopped (12/15/22 0035)   sodium chloride Stopped (12/08/22 1823)   anticoagulant sodium citrate     copper chloride 2 mg in dextrose 5 % 100 mL IVPB Stopped (12/15/22 1227)   norepinephrine (LEVOPHED) Adult infusion 5 mcg/min (12/16/22 0635)   PRN Meds:.sodium chloride, acetaminophen **OR** acetaminophen, alteplase, alteplase, anticoagulant sodium citrate, artificial tears, clonazePAM, fentaNYL (SUBLIMAZE) injection, heparin, heparin, ipratropium-albuterol, lidocaine (PF), lidocaine-prilocaine, loperamide HCl, magnesium hydroxide, ondansetron **OR** ondansetron (ZOFRAN) IV, mouth rinse, pentafluoroprop-tetrafluoroeth, sennosides, traZODone   Critical care time: 40 minutes    Harlon Ditty, AGACNP-BC [facility] Prefer epic messenger for cross cover needs If after hours, please call E-link

## 2022-12-16 NOTE — Progress Notes (Signed)
PHARMACY CONSULT NOTE  Pharmacy Consult for Electrolyte Monitoring and Replacement   Recent Labs: Potassium (mmol/L)  Date Value  12/16/2022 3.4 (L)  08/11/2013 3.3 (L)   Magnesium (mg/dL)  Date Value  19/14/7829 2.7 (H)   Calcium (mg/dL)  Date Value  56/21/3086 8.5 (L)   Calcium, Total (mg/dL)  Date Value  57/84/6962 9.2   Albumin (g/dL)  Date Value  95/28/4132 2.5 (L)  08/11/2013 3.1 (L)   Phosphorus (mg/dL)  Date Value  44/04/270 4.1   Sodium (mmol/L)  Date Value  12/16/2022 136  08/11/2013 138    Assessment: 72 y.o. female with medical history significant for asthma, COPD, type diabetes mellitus, s/p G-tube, GERD, ESRD on HD, hypertension, osteoarthritis, paroxysmal atrial fibrillation and OSA on CPAP, who presented to the emergency room with acute onset of altered mental status with lethargy and decreased responsiveness, started on CRRT.  CRRT stopped 8/25. line holiday. CVC placed 8/29 and HD performed same day. Patient is on tube feeds   Goal of Therapy:  Electrolytes WNL  Plan:  --20 mEq KCl per tube x 1 --Re-check electrolytes again tomorrow AM  Lowella Bandy 12/16/2022 7:43 AM

## 2022-12-17 ENCOUNTER — Inpatient Hospital Stay: Payer: Medicare Other

## 2022-12-17 ENCOUNTER — Encounter
Admission: EM | Disposition: A | Discharge status: Home Health | Payer: Self-pay | Source: Skilled Nursing Facility | Attending: Internal Medicine

## 2022-12-17 DIAGNOSIS — Z7189 Other specified counseling: Secondary | ICD-10-CM | POA: Diagnosis not present

## 2022-12-17 DIAGNOSIS — A419 Sepsis, unspecified organism: Secondary | ICD-10-CM | POA: Diagnosis not present

## 2022-12-17 DIAGNOSIS — R6521 Severe sepsis with septic shock: Secondary | ICD-10-CM | POA: Diagnosis not present

## 2022-12-17 LAB — RENAL FUNCTION PANEL
Albumin: 2.4 g/dL — ABNORMAL LOW (ref 3.5–5.0)
Anion gap: 6 (ref 5–15)
BUN: 51 mg/dL — ABNORMAL HIGH (ref 8–23)
CO2: 27 mmol/L (ref 22–32)
Calcium: 8.1 mg/dL — ABNORMAL LOW (ref 8.9–10.3)
Chloride: 99 mmol/L (ref 98–111)
Creatinine, Ser: 1.83 mg/dL — ABNORMAL HIGH (ref 0.44–1.00)
GFR, Estimated: 29 mL/min — ABNORMAL LOW (ref >60)
Glucose, Bld: 127 mg/dL — ABNORMAL HIGH (ref 70–99)
Phosphorus: 2.7 mg/dL (ref 2.5–4.6)
Potassium: 3.2 mmol/L — ABNORMAL LOW (ref 3.5–5.1)
Sodium: 132 mmol/L — ABNORMAL LOW (ref 135–145)

## 2022-12-17 LAB — GLUCOSE, CAPILLARY
Glucose-Capillary: 109 mg/dL — ABNORMAL HIGH (ref 70–99)
Glucose-Capillary: 110 mg/dL — ABNORMAL HIGH (ref 70–99)
Glucose-Capillary: 119 mg/dL — ABNORMAL HIGH (ref 70–99)
Glucose-Capillary: 122 mg/dL — ABNORMAL HIGH (ref 70–99)
Glucose-Capillary: 137 mg/dL — ABNORMAL HIGH (ref 70–99)
Glucose-Capillary: 92 mg/dL (ref 70–99)

## 2022-12-17 LAB — TYPE AND SCREEN
ABO/RH(D): O POS
Antibody Screen: NEGATIVE
Unit division: 0

## 2022-12-17 LAB — BPAM RBC
Blood Product Expiration Date: 202410042359
ISSUE DATE / TIME: 202409031229
Unit Type and Rh: 5100

## 2022-12-17 LAB — CBC
HCT: 28.7 % — ABNORMAL LOW (ref 36.0–46.0)
Hemoglobin: 9.2 g/dL — ABNORMAL LOW (ref 12.0–15.0)
MCH: 27.3 pg (ref 26.0–34.0)
MCHC: 32.1 g/dL (ref 30.0–36.0)
MCV: 85.2 fL (ref 80.0–100.0)
Platelets: 357 10*3/uL (ref 150–400)
RBC: 3.37 MIL/uL — ABNORMAL LOW (ref 3.87–5.11)
RDW: 21.4 % — ABNORMAL HIGH (ref 11.5–15.5)
WBC: 19.1 10*3/uL — ABNORMAL HIGH (ref 4.0–10.5)
nRBC: 1.1 % — ABNORMAL HIGH (ref 0.0–0.2)

## 2022-12-17 LAB — PROCALCITONIN: Procalcitonin: 1.9 ng/mL

## 2022-12-17 SURGERY — ECHOCARDIOGRAM, TRANSESOPHAGEAL
Anesthesia: General

## 2022-12-17 MED ORDER — DIPHENHYDRAMINE HCL 50 MG/ML IJ SOLN
12.5000 mg | Freq: Once | INTRAMUSCULAR | Status: AC
  Administered 2022-12-17: 12.5 mg via INTRAVENOUS
  Filled 2022-12-17: qty 1

## 2022-12-17 MED ORDER — POTASSIUM CHLORIDE 20 MEQ PO PACK
40.0000 meq | PACK | Freq: Once | ORAL | Status: AC
  Administered 2022-12-17: 40 meq
  Filled 2022-12-17: qty 2

## 2022-12-17 MED ORDER — PHENYLEPHRINE HCL 10 MG PO TABS
10.0000 mg | ORAL_TABLET | Freq: Two times a day (BID) | ORAL | Status: DC
  Administered 2022-12-17 – 2023-01-03 (×33): 10 mg via JEJUNOSTOMY
  Filled 2022-12-17 (×43): qty 1

## 2022-12-17 MED ORDER — PHENYLEPHRINE HCL 10 MG PO TABS
10.0000 mg | ORAL_TABLET | Freq: Two times a day (BID) | ORAL | Status: DC
  Filled 2022-12-17: qty 1

## 2022-12-17 MED ORDER — ALBUMIN HUMAN 25 % IV SOLN
25.0000 g | Freq: Two times a day (BID) | INTRAVENOUS | Status: AC
  Administered 2022-12-17 (×2): 25 g via INTRAVENOUS
  Filled 2022-12-17 (×2): qty 100

## 2022-12-17 NOTE — Discharge Planning (Signed)
Continuing to follow patient's admission. Clinic has been updated on patient's status. Patient remains active at [facility] and will resume TTS 11:20am schedule upon discharge.  Dimas Chyle Dialysis Coordinator II  [facility] Cell: [phone] eFax: [phone] [email or web address]

## 2022-12-17 NOTE — Evaluation (Signed)
Occupational Therapy Evaluation Patient Details Name: Alexandra Foster MRN: [id number] DOB: [birth year] Today's Date: 12/17/2022   History of Present Illness 72 y.o female with PMHx significant for ESRD on HD, L AKA admitted with Acute Metabolic Encephalopathy in setting of Severe Sepsis with Septic Shock due to MRSA Bacteremia from infected right chest Permcath, along with Enterococcus Faecalis UTI.   Clinical Impression   Patient is supine in bed with her daughter present in room agreeable to OT intervention. Pt's daughter reports pt is a LTC resident at [facility] and that they often are over there changing her and providing care from bed. Pt is bed bound at baseline and use of hoyer lift for transfer and total A for self care needs from bed level. Pt uses brief for toileting needs and staff assist with hygiene. Pt's family members also come to facility to wash pt from bed level and provide care as needed. They want to take her home and provide 24/7 care for her. Functionally pt assists with rolling and was able to roll to the R with mod A for therapist to position her in R side lying for skin concerns. OT to see pt to provide education to family for UE strengthening and positioning while in hospital.       If plan is discharge home, recommend the following: Two people to help with walking and/or transfers;A lot of help with bathing/dressing/bathroom;Assistance with cooking/housework;Assist for transportation;Help with stairs or ramp for entrance;Direct supervision/assist for financial management;Direct supervision/assist for medications management    Functional Status Assessment  Patient has had a recent decline in their functional status and demonstrates the ability to make significant improvements in function in a reasonable and predictable amount of time.  Equipment Recommendations  [employer];Hospital bed;Other (comment) (hoyer sling)       Precautions / Restrictions  Precautions Precautions: Fall Restrictions Weight Bearing Restrictions: No      Mobility Bed Mobility               General bed mobility comments: Pt has not sat on EOB for "some time now"    Transfers                   General transfer comment: Pt is hoyer lift dependent          ADL either performed or assessed with clinical judgement   ADL Overall ADL's : At baseline                                       General ADL Comments: total A for self care tasks at bed level at baseline     Vision Patient Visual Report: No change from baseline              Pertinent Vitals/Pain Pain Assessment Pain Assessment: Faces Faces Pain Scale: Hurts little more Pain Location: R UE Pain Descriptors / Indicators: Aching, Discomfort, Grimacing Pain Intervention(s): Limited activity within patient's tolerance, Monitored during session, Premedicated before session, Repositioned     Extremity/Trunk Assessment Upper Extremity Assessment Upper Extremity Assessment: Generalized weakness   Lower Extremity Assessment Lower Extremity Assessment: Generalized weakness       Communication     Cognition Arousal: Alert, Lethargic Behavior During Therapy: WFL for tasks assessed/performed Overall Cognitive Status: History of cognitive impairments - at baseline  General Comments: Pt is at baseline per daughter.                Home Living Family/patient expects to be discharged to:: Skilled nursing facility                                 Additional Comments: Pt has been at LTC for quite some time but family wants to take pt home at hospital discharge and they are able to provide 24/7 assist.      Prior Functioning/Environment Prior Level of Function : Needs assist             Mobility Comments: Hoyer lift transfer at baseline at LTC ADLs Comments: Pt is total A for self care from  bed level, tube feedings at baseline, and can wash face with set up A as well as assisting staff with rolling for care.        OT Problem List: Decreased strength;Decreased range of motion      OT Treatment/Interventions: Therapeutic exercise    OT Goals(Current goals can be found in the care plan section) Acute Rehab OT Goals Patient Stated Goal: to take her home OT Goal Formulation: With family Time For Goal Achievement: 12/31/22 Potential to Achieve Goals: Fair ADL Goals Pt/caregiver will Perform Home Exercise Program: Increased ROM;Increased strength;Both right and left upper extremity;With written HEP provided;With minimal assist  OT Frequency: Min 1X/week       AM-PAC OT "6 Clicks" Daily Activity     Outcome Measure Help from another person eating meals?: Total Help from another person taking care of personal grooming?: A Little Help from another person toileting, which includes using toliet, bedpan, or urinal?: Total Help from another person bathing (including washing, rinsing, drying)?: Total Help from another person to put on and taking off regular upper body clothing?: Total Help from another person to put on and taking off regular lower body clothing?: Total 6 Click Score: 8   End of Session Nurse Communication: Mobility status  Activity Tolerance: Patient limited by fatigue Patient left: in bed;with call bell/phone within reach;with family/visitor present  OT Visit Diagnosis: Muscle weakness (generalized) (M62.81)                Time: 1037-1100 OT Time Calculation (min): 23 min Charges:  OT General Charges $OT Visit: 1 Visit OT Evaluation $OT Eval Moderate Complexity: 1 Mod OT Treatments $Therapeutic Activity: 8-22 mins  [name], MS, OTR/L , CBIS ascom [phone]  12/17/22, 1:30 PM

## 2022-12-17 NOTE — Progress Notes (Addendum)
NAME:  Alexandra Foster, MRN:  [id number], DOB:  May 17, 1950, LOS: 11 ADMISSION DATE:  12/05/2022, CONSULTATION DATE:  12/06/2022 REFERRING MD:  Dr. Wynelle Link, CHIEF COMPLAINT:  Hypotension   Brief Pt Description / Synopsis:  72 y.o female with PMHx significant for ESRD on HD admitted with Acute Metabolic Encephalopathy in setting of Severe Sepsis with Septic Shock due to MRSA Bacteremia from infected right chest Permcath, along with Enterococcus Faecalis UTI.  History of Present Illness:  Alexandra Foster is a 72 y.o. female with past medical history significant for asthma, COPD, type diabetes mellitus, s/p G-tube, GERD, ESRD on HD, hypertension, osteoarthritis, paroxysmal atrial fibrillation and OSA on CPAP, who presented to [facility] ED on 12/05/22 from [facility] due to altered mental status with lethargy and decreased responsiveness.  There was a concern about aspiration pneumonia at his facility as the patient had an episode of nausea and vomiting and was reportedly coughing per her sister without wheezing or dyspnea or hypoxia.  The patient's temperature was 102.2 when EMS arrived to this facility.  The patient was not responding to questions throughout transport.  EMS noted the patient having slurred speech and significant weakness per family (family had not seen her since last week).  Code stroke was called upon arrival to the ED. teleneurology evaluated the patient, was not considered a candidate for thrombolytics given unclear last known well time, along with low suspicion for stroke given significantly global symptoms and multiple metabolic derangements and sepsis contributing to encephalopathy.  ED Course: Initial Vital Signs: Temperature 101.4 F orally, pulse 78, respiratory rate 19, blood pressure 125/70, SpO2 99% on room air Significant Labs: hyponatremia 128 and hypochloremia of 90 with blood glucose of 197, BUN of 67 and creatinine 4.53 with CO2 of 22 and anion gap 16. Alk phos was  157 and albumin 3.3, AST 66 and ALT 87 with total obtained of 8.6. Lactic acid was 2.3 and later 2.9. CBC showed leukocytosis of 21.4 with neutrophilia and anemia. Respiratory panel including influenza A, B, RSV and COVID-19 PCR came back negative. UA was positive for UTI alcohol level was less than 10. Urine drug screen was positive for tricyclic's.  Imaging Chest X-ray>>IMPRESSION: Mild vascular congestion likely related to volume overload. CT Head w/o contrast>>IMPRESSION: 1. No acute intracranial abnormality. 2. ASPECTS is 10. Medications Administered: IV Rocephin and Zithromax as well as 3 L bolus of IV normal saline   Hospitalist were asked to admit for further workup and treatment.  Nephrology consulted for hemodialysis.  Please see "significant hospital events" section below for full detailed hospital course.  Pertinent  Medical History   Past Medical History:  Diagnosis Date   AKI (acute kidney injury) (HCC)    a. 04/2021 in setting of bacteremia/shock.   Arthritis    Asthma    Bacteremia    a. 04/2021 S pyogenes bacteremia in setting of lower ext cellulitis.   COPD (chronic obstructive pulmonary disease) (HCC)    Diabetes mellitus without complication (HCC)    Endometriosis    GERD (gastroesophageal reflux disease)    History of echocardiogram    a. 07/2013 Echo: EF 55-60%, impaired relaxation, mild TR; b. 04/2021 Echo: EF 50-55%, mild LVH, nl RV fxn, mild BAE, Ao sclerosis w/o stenosis.   Hypertension    Obesity    PAF (paroxysmal atrial fibrillation) (HCC)    a. 04/2021 in setting of septic shock/cellulitis.   Sleep apnea    CPAP    Micro Data:  8/23:  SARS-CoV-2/RSV/Flu PCR>>negative 8/23: Blood cultures>> 4/4 bottles with MRSA & Staph epidermidis 8/23: MRSA PCR>> positive 8/23: Urine>> Enterococcus Faecalis 8/24: Permcath tip>> MRSA 8/25: Repeat Blood cultures>> no growth 8/30: Cdiff>>negative  8/31: GI Panel by PCR>>negative   Antimicrobials:    Anti-infectives (From admission, onward)    Start     Dose/Rate Route Frequency Ordered Stop   12/16/22 1400  vancomycin (VANCOCIN) IVPB 1000 mg/200 mL premix        1,000 mg 200 mL/hr over 60 Minutes Intravenous Every T-Th-Sa (Hemodialysis) 12/16/22 1229     12/13/22 1800  vancomycin (VANCOCIN) IVPB 1000 mg/200 mL premix        1,000 mg 200 mL/hr over 60 Minutes Intravenous  Once 12/13/22 1158 12/13/22 1820   12/11/22 2345  Ampicillin-Sulbactam (UNASYN) 3 g in sodium chloride 0.9 % 100 mL IVPB  Status:  Discontinued        3 g 200 mL/hr over 30 Minutes Intravenous Every 12 hours 12/11/22 2252 12/15/22 2041   12/11/22 1800  vancomycin (VANCOCIN) IVPB 1000 mg/200 mL premix        1,000 mg 200 mL/hr over 60 Minutes Intravenous  Once 12/11/22 1025 12/12/22 0635   12/09/22 1800  vancomycin (VANCOREADY) IVPB 750 mg/150 mL        750 mg 150 mL/hr over 60 Minutes Intravenous  Once 12/09/22 1423 12/09/22 1848   12/08/22 1040  vancomycin variable dose per unstable renal function (pharmacist dosing)  Status:  Discontinued         Does not apply See admin instructions 12/08/22 1041 12/16/22 1229   12/07/22 1400  vancomycin (VANCOCIN) IVPB 1000 mg/200 mL premix  Status:  Discontinued        1,000 mg 200 mL/hr over 60 Minutes Intravenous Every 24 hours 12/06/22 1234 12/08/22 1041   12/06/22 2200  cefTRIAXone (ROCEPHIN) 2 g in sodium chloride 0.9 % 100 mL IVPB  Status:  Discontinued        2 g 200 mL/hr over 30 Minutes Intravenous Every 24 hours 12/06/22 0132 12/06/22 1223   12/06/22 1500  ceFEPIme (MAXIPIME) 2 g in sodium chloride 0.9 % 100 mL IVPB  Status:  Discontinued        2 g 200 mL/hr over 30 Minutes Intravenous Every 12 hours 12/06/22 1252 12/08/22 1040   12/06/22 1315  vancomycin (VANCOREADY) IVPB 1750 mg/350 mL        1,750 mg 175 mL/hr over 120 Minutes Intravenous  Once 12/06/22 1224 12/06/22 1724   12/05/22 2130  cefTRIAXone (ROCEPHIN) 2 g in sodium chloride 0.9 % 100 mL IVPB   Status:  Discontinued        2 g 200 mL/hr over 30 Minutes Intravenous Every 24 hours 12/05/22 2115 12/06/22 0202   12/05/22 2130  azithromycin (ZITHROMAX) 500 mg in sodium chloride 0.9 % 250 mL IVPB  Status:  Discontinued        500 mg 250 mL/hr over 60 Minutes Intravenous Every 24 hours 12/05/22 2115 12/06/22 1223        Significant Hospital Events: Including procedures, antibiotic start and stop dates in addition to other pertinent events   8/23: Presented from [facility] due to AMS, Code Stroke called due to slurred speech and weakness. Deemed not a candidate for thrombolytics due to no clear last known normal time.  Admitted by [facility] with Nephrology consultation for dialysis. 8/24: Rapid response called due to Hypotension prior to/during Hemodialysis.  Transfer to ICU for peripheral vasopressors and initiation of  CRRT.  PCCM consulted to assist with vasopressors.  Blood cultures + for MRSA & Staph Epi. ID consulted. Vascular Surgery consulted, permcath removed.  Temporary HD catheter placed. 8/24: this morning with pronounce left facial droop and dysarthria, remains on CRRT on Levo @8  keep MAP>55 12/08/22: patient with multispecies sepsis, removing central line today due to bacteremia. Holding HD as per nephrology team due to shock.  12/09/22-patient with loose watery BM today, she does have skin breakdown over abd pannus.  She had re-initiation of her psychiatric medications today.  S/p pRBC transfusion overnight. Electrolytes are being repleted.  On room air but requires levophed still.  12/10/22- patient laying in bed with no acute distress.  Levophed weaned to 6mcg/kg/min, afebrile on room air.  Handling NGT feeds well.  Purewick with no UOP this am. 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  12/11/22- patient off levophed , requires HD today, will place catheter per renal team.  Wound care to see for anal wound.  With episode of emesis during HD and concern for possible  aspiration. 12/12/22- Back on low dose Levophed, weaning as able. Worsening Leukocytosis, ID plans to check for C.diff.  Ongoing discussions of when to remove newly placed temporary HD catheter. 12/13/22- Remains on levophed gtt despite scheduled midodrine and droxidopa.  HD session completed 12/14/22- No acute events overnight pt remains on levophed gtt.  Received HD. 12/15/22- No acute events overnight, remains on Levophed.  Increase Droxidopa to 300 mg TID. Leukocytosis improving. 12/16/22- No events overnight.  Remains on Levophed (less than yesterday, down to 5 mcg).  Will give 1 unit blood with HD today for Hgb 7.1.  Increase Midodrine to 20 mg TID.  Consult Cardiology for TEE, however pts family declined to proceed with TEE given possible risk associated with procedure  12/17/22: Pt remains on levophed gtt @2mcg /min.  Pt with c/o of RLE pain venous US pending to r/o VTE   Interim History / Subjective:  As outlined above under significant   Objective   Blood pressure (!) 94/52, pulse 62, temperature 99.6 F (37.6 C), temperature source Oral, resp. rate 20, height 5\' 4"  (1.626 m), weight 89.8 kg, SpO2 95%.        Intake/Output Summary (Last 24 hours) at 12/17/2022 0727 Last data filed at 12/17/2022 0600 Gross per 24 hour  Intake 3500.81 ml  Output 1695 ml  Net 1805.81 ml   Filed Weights   12/16/22 0500 12/16/22 1145 12/16/22 1517  Weight: 92.6 kg 89.8 kg 89.8 kg    Examination: General: Acute on chronically ill-appearing obese female, laying in bed, on room air NAD HENT: Atraumatic, normocephalic, neck supple, difficult to assess JVD due to body habitus Lungs: Diminished throughout, even, non labored Cardiovascular: NSR, s1s2, no m/r/g, 2+ radial/1+ distal pulses, 1+ lower extremity edema  Abdomen: +BS x4, obese, soft, non tender, non distended, PEG tube in place with dressing clean dry and intact Extremities: Prior left AKA, anasarca, RLE pain  Neuro: Alert, disoriented to situation  at times, following commands, moves extremities to command (mild to moderate dysarthria, slight left facial droop which family reports is baseline) GU: Purewick in place   Resolved Hospital Problem list     Assessment & Plan:   #Septic Shock #Chronic Hypotension (on midodrine outpatient) PMHx: Paroxsymal Atrial Fibrillation, HTN Echocardiogram 12/07/2022: LVEF 60-65%, RV systolic function low normal, RV size mildly enlarged, trivial mitral regurgitation - Continuous cardiac monitoring - Maintain MAP >55 per Nephrology - Vasopressors as needed to maintain MAP  goal - Increase Midodrine to 20 mg TID, continue Droxidopa to 300 mg TID - Lactic acid previously normalized - Repeat Cortisol is 15.5 (previously 35.9) ~ continue Florinef - TSH within normal limits   - Continue outpatient amiodarone - Albumin 25% solution 25 g q12hrs x1 day  - Venous US of RLE to r/o VTE due to pain and edema   #Severe Sepsis due to MRSA BACTEREMIA due to infected Permcath & Enterococcus Faecalis UTI #Suspected Aspiration ~ TREATED Permcath was removed on 8/24 2D Echocardiogram without evidence of vegetations - Trend WBC and monitor fever curve  - Trend PCT  - Follow cultures  -ID following appreciate input ~continue vancomycin pending cultures & sensitivities (completed 5 days of Unasyn) - ID recommends TEE ~ pts family have decided NOT to proceed with TEE due to concerns of pt receiving sedation for the procedure and ongoing need for levophed.  They understand without TEE this will extend duration of abx therapy per Cardiology progress note on 09/03~ ID recommends 6 weeks of Vancomycin if unable to undergo TEE - Ongoing discussions of when to remove newly placed Temporary femoral HD catheter (placed on 8/29)  #ESRD on Hemodialysis #Mild Hyponatremia #Hypokalemia  - Trend BMP  - Ensure adequate renal perfusion - Avoid nephrotoxic agents as able - Monitor I&O's / urinary output - Replace electrolytes as  indicated ~ Pharmacy following for assistance with electrolyte replacement - Nephrology following, appreciate input: HD per recommendations  #COPD without acute exacerbation #Concern for aspiration~TREATED #OSA, uses CPAP at home - Supplemental O2 as needed to maintain O2 sats 88 to 92% - CPAP qhs - Follow intermittent CXR & ABG as needed - Bronchodilators prn - Pulmonary toilet as able - ABX as above  #Anemia of chronic kidney disease - Trend CBC - Monitor for s/sx of bleeding  - VTE prophylaxis: subcutaneous heparin  - Transfuse for Hgb <7   #Diarrhea ~ IMPROVED Cdiff 08/30: negative  - Continue rectal tube  - Prn imodium  #Severe irritant dermatitis at abdominal folds and buttocks  - Turn q2hrs  - Wound care consulted appreciate input   #Type 2 diabetes mellitus - CBG's q4h; Target range of 140 to 180 - SSI - Follow ICU Hypo/Hyperglycemia protocol - Hold outpatient metformin  #Acute metabolic encephalopathy in setting of sepsis and multiple metabolic derangements~improving  #? TIA (left sided weakness, left facial droop) PMHx: Depression and Anxiety CT Head 8/23 negative for acute intracranial abnormality Code stroke called 8/23, deemed not a candidate for thrombolytics given unclear last known well time CTA head and neck reviewed and shows no acute intracranial abnormality or LVO -Evaluated by inpatient Neurology on 8/25 ~ Recommendations included: "-Appreciate neurosurgery confirmation that anticoagulation can be resumed from their perspective, secure chat sent to neurosurgeon on-call -Echocardiogram pending, may need TEE, if TTE is negative to definitively exclude endocarditis (defer to primary team, cardiology and infectious disease on risk/benefit) -Anticoagulation for stroke risk prevention once endocarditis is ruled out or resolved, and after neurosurgical clearance -LDL goal less than 70, A1c goal less than 7% (A1c and lipid panel ordered), please adjust  medications as needed -Carotid ultrasound -Outpatient follow-up for OSA/CPAP versus inpatient trial as this can increase stroke risk if untreated -Outpatient follow-up for pancreatic lesion as malignancy can increase stroke risk -Outpatient follow-up for IVC filter, could potentially be removed if anticoagulation is reinstituted" - Treatment of sepsis and metabolic derangements as outlined above - Provide supportive care - Promote normal sleep/wake cycle and family presence -  Avoid sedating medications as able - Continue outpatient hydroxyzine, lexapro, and prn klonopin - PT/OT     Patient is critically ill with bacteremia and shock.  Prognosis is guarded, high risk for further decompensation, cardiac arrest and death.  Given current illness superimposed on advanced age and multiple chronic comorbidities, overall long-term prognosis is poor.  Recommend DNR status.  Palliative Care was consulted but has since signed off as pt and family very clear they wish for full code and all aggressive measures.  Best Practice (right click and "Reselect all SmartList Selections" daily)   Diet/type: NPO, tube feeds DVT prophylaxis: prophylactic heparin  GI prophylaxis: Pepcid Lines: Right femoral trialysis and is still needed Foley:  N/A Code Status:  full code Last date of multidisciplinary goals of care discussion [12/17/22]  9/04: Updated pts daughter extensively regarding pts condition and current plan of care all questions were answered  Labs   CBC: Recent Labs  Lab 12/12/22 0512 12/13/22 0428 12/14/22 0440 12/15/22 0521 12/16/22 0531  WBC 49.8* 37.5* 40.2* 22.6* 18.2*  HGB 8.8* 7.9* 7.9* 7.4* 7.1*  HCT 28.5* 25.5* 25.3* 25.2* 24.0*  MCV 87.4 90.1 87.5 91.3 92.0  PLT 280 351 328 353 385    Basic Metabolic Panel: Recent Labs  Lab 12/11/22 0557 12/12/22 0512 12/13/22 0428 12/14/22 0440 12/15/22 0521 12/16/22 0531  NA 139 138 132* 136 137 136  K 2.9* 3.9 3.4* 3.6 3.4* 3.4*   CL 105 96* 96* 98 101 99  CO2 23 25 24 27 29 27   GLUCOSE 132* 90 172* 160* 159* 148*  BUN 99* 67* 90* 43* 61* 75*  CREATININE 3.57* 2.42* 3.09* 1.72* 2.40* 2.77*  CALCIUM 8.9 8.9 8.4* 8.2* 8.1* 8.5*  MG 2.2 1.9  --  1.5* 2.7*  --   PHOS 1.7* 2.0* 3.6 2.3* 3.9 4.1   GFR: Estimated Creatinine Clearance: 19.3 mL/min (A) (by C-G formula based on SCr of 2.77 mg/dL (H)). Recent Labs  Lab 12/13/22 0428 12/14/22 0440 12/15/22 0521 12/16/22 0531 12/16/22 1208 12/16/22 2231  PROCALCITON  --  3.18  --   --   --   --   WBC 37.5* 40.2* 22.6* 18.2*  --   --   LATICACIDVEN  --   --   --   --  1.4 1.5    Liver Function Tests: Recent Labs  Lab 12/12/22 0512 12/13/22 0428 12/14/22 0440 12/15/22 0521 12/16/22 0531  ALBUMIN 2.2* 2.1* 2.1* 1.9* 2.5*   No results for input(s): "LIPASE", "AMYLASE" in the last 168 hours. No results for input(s): "AMMONIA" in the last 168 hours.  ABG    Component Value Date/Time   PHART 7.40 05/14/2021 0341   PCO2ART 45 05/14/2021 0341   PO2ART 129 (H) 05/14/2021 0341   HCO3 27.9 05/14/2021 0341   ACIDBASEDEF 12.8 (H) 05/11/2021 0603   O2SAT 98.9 05/14/2021 0341     Coagulation Profile: No results for input(s): "INR", "PROTIME" in the last 168 hours.   Cardiac Enzymes: No results for input(s): "CKTOTAL", "CKMB", "CKMBINDEX", "TROPONINI" in the last 168 hours.  HbA1C: Hgb A1c MFr Bld  Date/Time Value Ref Range Status  12/07/2022 02:13 AM 5.2 4.8 - 5.6 % Final    Comment:    (NOTE) Pre diabetes:          5.7%-6.4%  Diabetes:              >6.4%  Glycemic control for   <7.0% adults with diabetes   05/05/2021 04:53 AM 6.7 (  H) 4.8 - 5.6 % Final    Comment:    (NOTE)         Prediabetes: 5.7 - 6.4         Diabetes: >6.4         Glycemic control for adults with diabetes: <7.0     CBG: Recent Labs  Lab 12/16/22 1559 12/16/22 1941 12/16/22 2327 12/17/22 0314 12/17/22 0724  GLUCAP 150* 124* 110* 109* 122*    Review of Systems:    Positives in BOLD: Gen: Denies fever, chills, weight change, fatigue, night sweats HEENT: Denies blurred vision, double vision, hearing loss, tinnitus, sinus congestion, rhinorrhea, sore throat, neck stiffness, dysphagia PULM: Denies shortness of breath, cough, sputum production, hemoptysis, wheezing CV: Denies chest pain, edema, orthopnea, paroxysmal nocturnal dyspnea, palpitations GI: Denies abdominal pain, nausea, vomiting, diarrhea, hematochezia, melena, constipation, change in bowel habits GU: Denies dysuria, hematuria, polyuria, oliguria, urethral discharge Endocrine: Denies hot or cold intolerance, polyuria, polyphagia or appetite change Derm: Denies rash, dry skin, scaling or peeling skin change Heme: Denies easy bruising, bleeding, bleeding gums Neuro: Denies headache, numbness, weakness, slurred speech, loss of memory or consciousness   Past Medical History:  She,  has a past medical history of AKI (acute kidney injury) (HCC), Arthritis, Asthma, Bacteremia, COPD (chronic obstructive pulmonary disease) (HCC), Diabetes mellitus without complication (HCC), Endometriosis, GERD (gastroesophageal reflux disease), History of echocardiogram, Hypertension, Obesity, PAF (paroxysmal atrial fibrillation) (HCC), and Sleep apnea.   Surgical History:   Past Surgical History:  Procedure Laterality Date   ABDOMINAL HYSTERECTOMY     APPLICATION OF WOUND VAC Left 05/17/2021   Procedure: APPLICATION OF WOUND VAC/WOUND VAC EXCHANGE-Matrix Myriad;  Surgeon: Carolan Shiver, MD;  Location: [facility];  Service: General;  Laterality: Left;   APPLICATION OF WOUND VAC  05/24/2021   Procedure: APPLICATION OF WOUND VAC;  Surgeon: Carolan Shiver, MD;  Location: [facility];  Service: General;;   APPLICATION OF WOUND VAC  05/10/2021   Procedure: APPLICATION OF WOUND VAC;  Surgeon: Carolan Shiver, MD;  Location: [facility];  Service: General;;   COLONOSCOPY  10/29/2006   Dr Servando Snare   COLONOSCOPY WITH  PROPOFOL N/A 11/05/2016   Procedure: COLONOSCOPY WITH PROPOFOL;  Surgeon: Earline Mayotte, MD;  Location: [facility];  Service: Endoscopy;  Laterality: N/A;   INCISION AND DRAINAGE OF WOUND Left 05/24/2021   Procedure: IRRIGATION AND DEBRIDEMENT LEFT LEG;  Surgeon: Carolan Shiver, MD;  Location: [facility];  Service: General;  Laterality: Left;   INCISION AND DRAINAGE OF WOUND Left 05/10/2021   Procedure: IRRIGATION AND DEBRIDEMENT LEFT LEG;  Surgeon: Carolan Shiver, MD;  Location: [facility];  Service: General;  Laterality: Left;   IR FLUORO GUIDE CV LINE RIGHT  06/12/2021   IR PERC TUN PERIT CATH WO PORT S&I /IMAG  06/12/2021   IR REPLC GASTRO/COLONIC TUBE PERCUT W/FLUORO  06/12/2021   IVC FILTER INSERTION N/A 05/29/2021   Procedure: IVC FILTER INSERTION;  Surgeon: Renford Dills, MD;  Location: [facility];  Service: Cardiovascular;  Laterality: N/A;   MINOR GRAFT APPLICATION  05/24/2021   Procedure: Myriad Matrix  APPLICATION;  Surgeon: Carolan Shiver, MD;  Location: [facility];  Service: General;;   NASAL SINUS SURGERY  2002   Dr Chestine Spore   PEG PLACEMENT N/A 05/28/2021   Procedure: PERCUTANEOUS ENDOSCOPIC GASTROSTOMY (PEG) PLACEMENT;  Surgeon: Sung Amabile, DO;  Location: [facility];  Service: General;  Laterality: N/A;  TRAVEL CASE   TRACHEOSTOMY TUBE PLACEMENT N/A 05/17/2021   Procedure:  TRACHEOSTOMY;  Surgeon: Geanie Logan, MD;  Location: [facility];  Service: ENT;  Laterality: N/A;   WOUND DEBRIDEMENT Left 05/07/2021   Procedure: DEBRIDEMENT WOUND;  Surgeon: Carolan Shiver, MD;  Location: [facility];  Service: General;  Laterality: Left;     Social History:   reports that she quit smoking about 21 years ago. Her smoking use included cigarettes. She started smoking about 61 years ago. She has a 80 pack-year smoking history. She quit smokeless tobacco use about 21 years ago.  Her smokeless tobacco use included snuff. She reports that she does not drink alcohol  and does not use drugs.   Family History:  Her family history includes Alcohol abuse in her brother; Breast cancer (age of onset: 85) in her sister; Congestive Heart Failure in her mother; Coronary artery disease (age of onset: 40) in her father; Heart disease in her brother; Varicose Veins in her brother.   Allergies No Known Allergies   Home Medications  Prior to Admission medications   Medication Sig Start Date End Date Taking? Authorizing Provider  amiodarone (PACERONE) 200 MG tablet Place 1 tablet (200 mg total) into feeding tube 2 (two) times daily. 06/13/21  Yes Erin Fulling, MD  ascorbic acid (VITAMIN C) 500 MG tablet Place 1 tablet (500 mg total) into feeding tube 2 (two) times daily. 06/13/21  Yes Kasa, Wallis Bamberg, MD  busPIRone (BUSPAR) 5 MG tablet Take 5 mg by mouth 2 (two) times daily. 12/04/22  Yes [provider]  cholecalciferol (VITAMIN D) 25 MCG tablet Place 2 tablets (2,000 Units total) into feeding tube daily. 06/14/21  Yes Kasa, Wallis Bamberg, MD  clonazePAM (KLONOPIN) 0.5 MG tablet Take 0.25-0.5 mg by mouth 2 (two) times daily. 0.25 mg every morning and 0.5 mg at bedtime for Agitation 11/18/22  Yes [provider]  escitalopram (LEXAPRO) 10 MG tablet Place 1 tablet (10 mg total) into feeding tube daily. 06/14/21  Yes Erin Fulling, MD  hydrOXYzine (ATARAX) 50 MG tablet Take 100 mg by mouth 2 (two) times daily. 12/02/22  Yes [provider]  lip balm (BLISTEX) OINT Apply 1 application topically 3 (three) times daily. 06/13/21  Yes Erin Fulling, MD  loperamide (IMODIUM) 2 MG capsule Take 1 capsule (2 mg total) by mouth as needed for diarrhea or loose stools. 06/13/21  Yes Erin Fulling, MD  midodrine (PROAMATINE) 5 MG tablet Place 3 tablets (15 mg total) into feeding tube 3 (three) times daily with meals. 06/13/21  Yes Erin Fulling, MD  multivitamin (RENA-VIT) TABS tablet Place 1 tablet into feeding tube at bedtime. 06/13/21  Yes Erin Fulling, MD  omeprazole (PRILOSEC) 20 MG  capsule Take 20 mg by mouth daily. 11/12/22  Yes [provider]  sennosides (SENOKOT) 8.8 MG/5ML syrup Place 10 mLs into feeding tube at bedtime as needed for moderate constipation. 06/13/21  Yes Erin Fulling, MD  artificial tears (LACRILUBE) OINT ophthalmic ointment Place into both eyes every 4 (four) hours as needed for dry eyes. Patient not taking: Reported on 12/06/2022 06/13/21   Erin Fulling, MD  chlorhexidine gluconate, MEDLINE KIT, (PERIDEX) 0.12 % solution 15 mLs by Mouth Rinse route 2 (two) times daily. Patient not taking: Reported on 12/06/2022 06/13/21   Erin Fulling, MD  collagenase (SANTYL) ointment Apply topically daily. Patient not taking: Reported on 12/06/2022 06/14/21   Erin Fulling, MD  dextrose 5 % SOLN 100 mL with copper chloride 0.4 MG/ML SOLN 2 mg Inject 2 mg into the vein daily. 06/14/21   Erin Fulling, MD  ipratropium-albuterol (DUONEB) 0.5-2.5 (3) MG/3ML SOLN Take 3 mLs by nebulization 3 (three) times daily. Patient not taking: Reported on 12/06/2022 06/13/21   Erin Fulling, MD  lactobacillus (FLORANEX/LACTINEX) PACK Place 1 packet (1 g total) into feeding tube 3 (three) times daily with meals. Patient not taking: Reported on 12/06/2022 06/13/21   Erin Fulling, MD  pantoprazole sodium (PROTONIX) 40 mg Place 40 mg into feeding tube daily. Patient not taking: Reported on 12/06/2022 06/13/21   Erin Fulling, MD  Scheduled Meds:  sodium chloride   Intravenous Once   amiodarone  200 mg Per Tube BID   ascorbic acid  500 mg Per Tube BID   atorvastatin  40 mg Per Tube Daily   chlorhexidine  15 mL Mouth Rinse BID   Chlorhexidine Gluconate Cloth  6 each Topical Q0600   vitamin D3  2,000 Units Per Tube Daily   [START ON 12/18/2022] copper  2 mg Per Tube Daily   droxidopa  300 mg Per Tube TID WC   epoetin (EPOGEN/PROCRIT) injection  10,000 Units Intravenous Q T,Th,Sa-HD   escitalopram  10 mg Per Tube Daily   famotidine  10 mg Per Tube Daily   feeding supplement (NEPRO CARB STEADY)  1,000  mL Per Tube Q24H   feeding supplement (PROSource TF20)  60 mL Per Tube Daily   fludrocortisone  0.2 mg Per Tube Daily   free water  30 mL Per Tube Q4H   Gerhardt's butt cream   Topical BID   heparin injection (subcutaneous)  5,000 Units Subcutaneous Q8H   hydrOXYzine  50 mg Per Tube Q6H   insulin aspart  0-6 Units Subcutaneous Q4H   midodrine  20 mg Per Tube TID WC   multivitamin  1 tablet Per Tube QHS   mupirocin ointment   Nasal BID   nutrition supplement (JUVEN)  1 packet Per Tube BID BM   mouth rinse  15 mL Mouth Rinse 4 times per day   Continuous Infusions:  sodium chloride Stopped (12/15/22 0035)   sodium chloride Stopped (12/08/22 1823)   sodium chloride     anticoagulant sodium citrate     copper chloride 2 mg in dextrose 5 % 100 mL IVPB Stopped (12/16/22 1352)   norepinephrine (LEVOPHED) Adult infusion 2 mcg/min (12/17/22 7829)   vancomycin Stopped (12/16/22 1456)   PRN Meds:.sodium chloride, acetaminophen **OR** acetaminophen, alteplase, alteplase, anticoagulant sodium citrate, artificial tears, clonazePAM, fentaNYL (SUBLIMAZE) injection, heparin, heparin, ipratropium-albuterol, lidocaine (PF), lidocaine-prilocaine, loperamide HCl, magnesium hydroxide, ondansetron **OR** ondansetron (ZOFRAN) IV, mouth rinse, pentafluoroprop-tetrafluoroeth, sennosides, traZODone   Critical care time: 40 minutes    Zada Girt, AGNP  Pulmonary/Critical Care Pager [phone] (please enter 7 digits) PCCM Consult Pager [phone] (please enter 7 digits)

## 2022-12-17 NOTE — Consult Note (Signed)
WOC nursing has seen patient several times during this admission. 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 A fecal collector (1087) was placed by [facility] nurse with bedside nursing last week. And a FMS has replaced this 12/12/22.  Containment and protection of the affected skin is about all that can be done. I added a more aggressive moisture barrier cream last week with hydrocortisone.  She was not on a low air loss mattress last week in the ICU, but the nursing team assured me that they would move her on to one that day. If topical care is needed for the development of pressure injuries which does happen in patient with severe ICD please advise.  I have asked for  new images of the affected areas or to [facility] in to assess. No onsite WOC nurse at the [facility] today.    Re consult if needed, will not follow at this time. Thanks  Heavan [employer]com [employer], RN,CWOCN, CNS, CWON-AP [phone])

## 2022-12-17 NOTE — Progress Notes (Signed)
Physical Therapy Treatment Patient Details Name: Alexandra Foster MRN: [id number] DOB: 07/28/1950 Today's Date: 12/17/2022   History of Present Illness 72 y.o female with PMHx significant for ESRD on HD, L AKA admitted with Acute Metabolic Encephalopathy in setting of Severe Sepsis with Septic Shock due to MRSA Bacteremia from infected right chest Permcath, along with Enterococcus Faecalis UTI.    PT Comments  Pt lethargic on arrival, but pleasant and willing to work with PT on a limited basis.  She reports being very tired today, but did participate well with supine exercises.  Pt inconsistently able to do AA/AROM and at times needed extra assist, PROM.  Despite rectal tube she had a loose stool near the end of the session and needed assist with clean up.  Mobility, further PT deferred.     If plan is discharge home, recommend the following: A lot of help with bathing/dressing/bathroom;Assistance with cooking/housework;Assist for transportation;Help with stairs or ramp for entrance;Supervision due to cognitive status;Direct supervision/assist for financial management   Can travel by private vehicle        Equipment Recommendations  [place] lift;Hospital bed (pt will have signficant DME needs if going home)    Recommendations for Other Services       Precautions / Restrictions Precautions Precautions: Fall Required Braces or Orthoses:  (temp fem cath) Restrictions Weight Bearing Restrictions: No     Mobility  Bed Mobility               General bed mobility comments: discussed getting up to sitting per our efforts last weekend, but pt reports being too tired.  then had BM mess (despite rectal tube) during exercises and further mobility deferred    Transfers                   General transfer comment: baseline is hoyer lift dependent    Ambulation/Gait                   Stairs             Wheelchair Mobility     Tilt Bed    Modified Rankin  (Stroke Patients Only)       Balance                                            Cognition Arousal: Alert, Lethargic Behavior During Therapy: WFL for tasks assessed/performed Overall Cognitive Status: History of cognitive impairments - at baseline                                 General Comments: Pt is at baseline per daughter.        Exercises General Exercises - Lower Extremity Ankle Circles/Pumps: AAROM, Right, 10 reps Short Arc Quad: AROM, AAROM, 10 reps (unable to get to full TKE AROM, poor quad control) Heel Slides: AAROM, 10 reps, Right (lightly resisted leg ext) Hip ABduction/ADduction: AAROM, PROM, 10 reps, Both    General Comments        Pertinent Vitals/Pain Pain Assessment Pain Assessment: Faces Faces Pain Scale: Hurts a little bit Pain Location: indicates some mild hesitancy with light R LE exercises    Home Living Family/patient expects to be discharged to:: Skilled nursing facility  Additional Comments: Pt has been at LTC for quite some time but family wants to take pt home at hospital discharge and they are able to provide 24/7 assist.    Prior Function            PT Goals (current goals can now be found in the care plan section) Progress towards PT goals: Progressing toward goals    Frequency    Min 1X/week      PT Plan      Co-evaluation              AM-PAC PT "6 Clicks" Mobility   Outcome Measure  Help needed turning from your back to your side while in a flat bed without using bedrails?: Total Help needed moving from lying on your back to sitting on the side of a flat bed without using bedrails?: Total Help needed moving to and from a bed to a chair (including a wheelchair)?: Total Help needed standing up from a chair using your arms (e.g., wheelchair or bedside chair)?: Total Help needed to walk in hospital room?: Total Help needed climbing 3-5 steps with a railing? :  Total 6 Click Score: 6    End of Session   Activity Tolerance: Patient limited by lethargy;Patient limited by fatigue Patient left: with nursing/sitter in room;in bed Nurse Communication: Mobility status;Need for lift equipment PT Visit Diagnosis: Muscle weakness (generalized) (M62.81);Difficulty in walking, not elsewhere classified (R26.2)     Time: 1610-9604 PT Time Calculation (min) (ACUTE ONLY): 14 min  Charges:    $Therapeutic Exercise: 8-22 mins PT General Charges $$ ACUTE PT VISIT: 1 Visit                     Malachi Pro, DPT 12/17/2022, 3:24 PM

## 2022-12-17 NOTE — Progress Notes (Addendum)
0730 patient alert x3 able to make needs known patient has multiple open area sacrum bleeding bilateral fem areas are open moister associated interdry applied patient neck also open moister associated. CC team and patient daughter informed patient wants multiple blankets keeping her sweaty daughter and patient informed this is bad for her skin. Wounds added to LDAs.   1600 mitts placed on patients she is scratching causing more bleeding and accidentally pulled out FMS  1800 patient FMS out gain unable to keep in CC team aware FMS removed. Patient transferred to ICU because air mattress bed still not delivered.

## 2022-12-17 NOTE — Progress Notes (Signed)
[facility]  ROUNDING NOTE   Subjective:   Patient remains critically ill.    Still requiring Levophed.  Able to follow simple commands Now has a right femoral temp cath. Getting iv copper infusion Nocturnal Tube feeds via G tube    Objective:  Vital signs in last 24 hours:  Temp:  [98.2 F (36.8 C)-99.6 F (37.6 C)] 98.8 F (37.1 C) (09/04 1200) Pulse Rate:  [56-73] 66 (09/04 1300) Resp:  [14-27] 21 (09/04 1300) BP: (82-122)/(34-102) 107/43 (09/04 1300) SpO2:  [92 %-100 %] 96 % (09/04 1300) Weight:  [89.8 kg] 89.8 kg (09/03 1517)  Weight change: -2.8 kg Filed Weights   12/16/22 0500 12/16/22 1145 12/16/22 1517  Weight: 92.6 kg 89.8 kg 89.8 kg    Intake/Output: I/O last 3 completed shifts: In: 3997.7 [I.V.:718.2; Blood:390; NG/GT:2589.4; IV Piggyback:300.1] Out: 1945 [Urine:900; Other:500; Stool:545]   Intake/Output this shift:  Total I/O In: 1024.3 [I.V.:30.1; Other:400; NG/GT:455; IV Piggyback:139.1] Out: 200 [Urine:200]  Physical Exam: General: Critically ill  Head: Normocephalic, atraumatic. Moist oral mucosal membranes  Eyes: Anicteric  Lungs:  Diminished bilaterally   Heart: regular   Abdomen:  Soft, nontender  Extremities:  1+ peripheral edema.  Neurologic: Alert to self and place,    Access:  Right femoral temp HD catheter 8/29.     Basic Metabolic Panel: Recent Labs  Lab 12/11/22 0557 12/12/22 0512 12/13/22 0428 12/14/22 0440 12/15/22 0521 12/16/22 0531 12/17/22 0637  NA 139 138 132* 136 137 136 132*  K 2.9* 3.9 3.4* 3.6 3.4* 3.4* 3.2*  CL 105 96* 96* 98 101 99 99  CO2 23 25 24 27 29 27 27   GLUCOSE 132* 90 172* 160* 159* 148* 127*  BUN 99* 67* 90* 43* 61* 75* 51*  CREATININE 3.57* 2.42* 3.09* 1.72* 2.40* 2.77* 1.83*  CALCIUM 8.9 8.9 8.4* 8.2* 8.1* 8.5* 8.1*  MG 2.2 1.9  --  1.5* 2.7*  --   --   PHOS 1.7* 2.0* 3.6 2.3* 3.9 4.1 2.7    Liver Function Tests: Recent Labs  Lab 12/13/22 0428 12/14/22 0440 12/15/22 0521  12/16/22 0531 12/17/22 0637  ALBUMIN 2.1* 2.1* 1.9* 2.5* 2.4*   No results for input(s): "LIPASE", "AMYLASE" in the last 168 hours. No results for input(s): "AMMONIA" in the last 168 hours.  CBC: Recent Labs  Lab 12/13/22 0428 12/14/22 0440 12/15/22 0521 12/16/22 0531 12/17/22 0637  WBC 37.5* 40.2* 22.6* 18.2* 19.1*  HGB 7.9* 7.9* 7.4* 7.1* 9.2*  HCT 25.5* 25.3* 25.2* 24.0* 28.7*  MCV 90.1 87.5 91.3 92.0 85.2  PLT 351 328 353 385 357    Cardiac Enzymes: No results for input(s): "CKTOTAL", "CKMB", "CKMBINDEX", "TROPONINI" in the last 168 hours.  BNP: Invalid input(s): "POCBNP"  CBG: Recent Labs  Lab 12/16/22 1941 12/16/22 2327 12/17/22 0314 12/17/22 0724 12/17/22 1136  GLUCAP 124* 110* 109* 122* 137*    Microbiology: Results for orders placed or performed during the hospital encounter of 12/05/22  Culture, blood (Routine x 2)     Status: Abnormal   Collection Time: 12/05/22  8:56 PM   Specimen: BLOOD  Result Value Ref Range Status   Specimen Description   Final    BLOOD BLOOD RIGHT ARM Performed at [facility], [address], [place], [place] [address]    Special Requests   Final    BOTTLES DRAWN AEROBIC AND ANAEROBIC Blood Culture adequate volume Performed at [facility], [address], [place], [place] [address]  Culture  Setup Time   Final    GRAM POSITIVE COCCI IN BOTH AEROBIC AND ANAEROBIC BOTTLES CRITICAL RESULT CALLED TO, READ BACK BY AND VERIFIED WITH: SHEEMA HALLLAJI AT 1206 12/06/22.PMF Performed at [facility], [address] [place], [place] [address]    Culture (A)  Final    STAPHYLOCOCCUS AUREUS SUSCEPTIBILITIES PERFORMED ON PREVIOUS CULTURE WITHIN THE LAST 5 DAYS. Performed at [facility], [address], [place], [place] [address]    Report Status 12/08/2022 FINAL  Final  Culture, blood (Routine x 2)     Status: Abnormal   Collection Time: 12/05/22  8:57 PM   Specimen: BLOOD  Result Value  Ref Range Status   Specimen Description   Final    BLOOD BLOOD LEFT ARM Performed at [facility], [address] [place], [place] [address]    Special Requests   Final    BOTTLES DRAWN AEROBIC AND ANAEROBIC Blood Culture adequate volume Performed at [facility], [address] [place], [place] [address]    Culture  Setup Time   Final    GRAM POSITIVE COCCI IN BOTH AEROBIC AND ANAEROBIC BOTTLES CRITICAL RESULT CALLED TO, READ BACK BY AND VERIFIED WITH: SHEEMA HALLAJI AT 1206 12/06/22.PMF Performed at [facility], [address], [place], [place] [address]    Culture METHICILLIN RESISTANT STAPHYLOCOCCUS AUREUS (A)  Final   Report Status 12/08/2022 FINAL  Final   Organism ID, Bacteria METHICILLIN RESISTANT STAPHYLOCOCCUS AUREUS  Final      Susceptibility   Methicillin resistant staphylococcus aureus - MIC*    CIPROFLOXACIN >=8 RESISTANT Resistant     ERYTHROMYCIN >=8 RESISTANT Resistant     GENTAMICIN <=0.5 SENSITIVE Sensitive     OXACILLIN >=4 RESISTANT Resistant     TETRACYCLINE <=1 SENSITIVE Sensitive     VANCOMYCIN <=0.5 SENSITIVE Sensitive     TRIMETH/SULFA >=320 RESISTANT Resistant     CLINDAMYCIN <=0.25 SENSITIVE Sensitive     RIFAMPIN <=0.5 SENSITIVE Sensitive     Inducible Clindamycin NEGATIVE Sensitive     LINEZOLID 2 SENSITIVE Sensitive     * METHICILLIN RESISTANT STAPHYLOCOCCUS AUREUS  Blood Culture ID Panel (Reflexed)     Status: Abnormal   Collection Time: 12/05/22  8:57 PM  Result Value Ref Range Status   Enterococcus faecalis NOT DETECTED NOT DETECTED Final   Enterococcus Faecium NOT DETECTED NOT DETECTED Final   Listeria monocytogenes NOT DETECTED NOT DETECTED Final   Staphylococcus species DETECTED (A) NOT DETECTED Final    Comment: CRITICAL RESULT CALLED TO, READ BACK BY AND VERIFIED WITH: SHEEMA HALLAJI AT 1206 12/06/22.PMF    Staphylococcus aureus (BCID) DETECTED (A) NOT DETECTED Final    Comment: Methicillin  (oxacillin)-resistant Staphylococcus aureus (MRSA). MRSA is predictably resistant to beta-lactam antibiotics (except ceftaroline). Preferred therapy is vancomycin unless clinically contraindicated. Patient requires contact precautions if  hospitalized. CRITICAL RESULT CALLED TO, READ BACK BY AND VERIFIED WITH: SHEEMA HALLAJI AT 1206 12/06/22.PMF    Staphylococcus epidermidis DETECTED (A) NOT DETECTED Final    Comment: CRITICAL RESULT CALLED TO, READ BACK BY AND VERIFIED WITH: SHEEMA HALLAJI AT 1206 12/06/22.PMF    Staphylococcus lugdunensis NOT DETECTED NOT DETECTED Final   Streptococcus species NOT DETECTED NOT DETECTED Final   Streptococcus agalactiae NOT DETECTED NOT DETECTED Final   Streptococcus pneumoniae NOT DETECTED NOT DETECTED Final   Streptococcus pyogenes NOT DETECTED NOT DETECTED Final   A.calcoaceticus-baumannii NOT DETECTED NOT DETECTED Final   Bacteroides fragilis NOT DETECTED NOT DETECTED  Final   Enterobacterales NOT DETECTED NOT DETECTED Final   Enterobacter cloacae complex NOT DETECTED NOT DETECTED Final   Escherichia coli NOT DETECTED NOT DETECTED Final   Klebsiella aerogenes NOT DETECTED NOT DETECTED Final   Klebsiella oxytoca NOT DETECTED NOT DETECTED Final   Klebsiella pneumoniae NOT DETECTED NOT DETECTED Final   Proteus species NOT DETECTED NOT DETECTED Final   Salmonella species NOT DETECTED NOT DETECTED Final   Serratia marcescens NOT DETECTED NOT DETECTED Final   Haemophilus influenzae NOT DETECTED NOT DETECTED Final   Neisseria meningitidis NOT DETECTED NOT DETECTED Final   Pseudomonas aeruginosa NOT DETECTED NOT DETECTED Final   Stenotrophomonas maltophilia NOT DETECTED NOT DETECTED Final   Candida albicans NOT DETECTED NOT DETECTED Final   Candida auris NOT DETECTED NOT DETECTED Final   Candida glabrata NOT DETECTED NOT DETECTED Final   Candida krusei NOT DETECTED NOT DETECTED Final   Candida parapsilosis NOT DETECTED NOT DETECTED Final   Candida  tropicalis NOT DETECTED NOT DETECTED Final   Cryptococcus neoformans/gattii NOT DETECTED NOT DETECTED Final   Methicillin resistance mecA/C DETECTED (A) NOT DETECTED Final    Comment: CRITICAL RESULT CALLED TO, READ BACK BY AND VERIFIED WITH: SHEEMA HALLAJI AT 1206 12/06/22.PMF    Meth resistant mecA/C and MREJ DETECTED (A) NOT DETECTED Final    Comment: CRITICAL RESULT CALLED TO, READ BACK BY AND VERIFIED WITH: SHEEMA HALLAJI AT 1206 12/06/22.PMF Performed at [facility], [address] [place], [place] [address]   Resp panel by RT-PCR (RSV, Flu A&B, Covid) Anterior Nasal Swab     Status: None   Collection Time: 12/05/22  9:53 PM   Specimen: Anterior Nasal Swab  Result Value Ref Range Status   SARS Coronavirus 2 by RT PCR NEGATIVE NEGATIVE Final    Comment: (NOTE) SARS-CoV-2 target nucleic acids are NOT DETECTED.  The SARS-CoV-2 RNA is generally detectable in upper respiratory specimens during the acute phase of infection. The lowest concentration of SARS-CoV-2 viral copies this assay can detect is 138 copies/mL. A negative result does not preclude SARS-Cov-2 infection and should not be used as the sole basis for treatment or other patient management decisions. A negative result may occur with  improper specimen collection/handling, submission of specimen other than nasopharyngeal swab, presence of viral mutation(s) within the areas targeted by this assay, and inadequate number of viral copies(<138 copies/mL). A negative result must be combined with clinical observations, patient history, and epidemiological information. The expected result is Negative.  Fact Sheet for Patients:  BloggerCourse.com  Fact Sheet for Healthcare Providers:  SeriousBroker.it  This test is no t yet approved or cleared by the [place] FDA and  has been authorized for detection and/or diagnosis of SARS-CoV-2 by FDA under an Emergency Use  Authorization (EUA). This EUA will remain  in effect (meaning this test can be used) for the duration of the COVID-19 declaration under Section 564(b)(1) of the Act, 21 U.S.C.section 360bbb-3(b)(1), unless the authorization is terminated  or revoked sooner.       Influenza A by PCR NEGATIVE NEGATIVE Final   Influenza B by PCR NEGATIVE NEGATIVE Final    Comment: (NOTE) The Xpert Xpress SARS-CoV-2/FLU/RSV plus assay is intended as an aid in the diagnosis of influenza from Nasopharyngeal swab specimens and should not be used as a sole basis for treatment. Nasal washings and aspirates are unacceptable for Xpert Xpress SARS-CoV-2/FLU/RSV testing.  Fact Sheet for Patients: BloggerCourse.com  Fact Sheet for Healthcare Providers: SeriousBroker.it  This test is  not yet approved or cleared by the [place] and has been authorized for detection and/or diagnosis of SARS-CoV-2 by FDA under an Emergency Use Authorization (EUA). This EUA will remain in effect (meaning this test can be used) for the duration of the COVID-19 declaration under Section 564(b)(1) of the Act, 21 U.S.C. section 360bbb-3(b)(1), unless the authorization is terminated or revoked.     Resp Syncytial Virus by PCR NEGATIVE NEGATIVE Final    Comment: (NOTE) Fact Sheet for Patients: BloggerCourse.com  Fact Sheet for Healthcare Providers: SeriousBroker.it  This test is not yet approved or cleared by the [place] FDA and has been authorized for detection and/or diagnosis of SARS-CoV-2 by FDA under an Emergency Use Authorization (EUA). This EUA will remain in effect (meaning this test can be used) for the duration of the COVID-19 declaration under Section 564(b)(1) of the Act, 21 U.S.C. section 360bbb-3(b)(1), unless the authorization is terminated or revoked.  Performed at [facility], [address] [place], [place] [address]   MRSA Next Gen by PCR, Nasal     Status: Abnormal   Collection Time: 12/06/22  4:27 AM   Specimen: Nasal Mucosa; Nasal Swab  Result Value Ref Range Status   MRSA by PCR Next Gen DETECTED (A) NOT DETECTED Final    Comment: RESULT CALLED TO, READ BACK BY AND VERIFIED WITH: KRISTINE CHAMBERS AT 6045 12/06/22.PMF (NOTE) The GeneXpert MRSA Assay (FDA approved for NASAL specimens only), is one component of a comprehensive MRSA colonization surveillance program. It is not intended to diagnose MRSA infection nor to guide or monitor treatment for MRSA infections. Test performance is not FDA approved in patients less than 47 years old. Performed at [facility], [address] [place], [place] [address]   Cath Tip Culture     Status: Abnormal   Collection Time: 12/06/22  2:22 PM   Specimen: Catheter Tip; Other  Result Value Ref Range Status   Specimen Description   Final    CATH TIP Performed at [facility], [address] [place], [place] [address]    Special Requests   Final    NONE Performed at [facility], [address] [place], [place] [address]    Culture (A)  Final    >=100,000 COLONIES/mL METHICILLIN RESISTANT STAPHYLOCOCCUS AUREUS   Report Status 12/08/2022 FINAL  Final   Organism ID, Bacteria METHICILLIN RESISTANT STAPHYLOCOCCUS AUREUS (A)  Final      Susceptibility   Methicillin resistant staphylococcus aureus - MIC*    CIPROFLOXACIN >=8 RESISTANT Resistant     ERYTHROMYCIN >=8 RESISTANT Resistant     GENTAMICIN <=0.5 SENSITIVE Sensitive     OXACILLIN >=4 RESISTANT Resistant     TETRACYCLINE <=1 SENSITIVE Sensitive     VANCOMYCIN <=0.5 SENSITIVE Sensitive     TRIMETH/SULFA >=320 RESISTANT Resistant     CLINDAMYCIN <=0.25 SENSITIVE Sensitive     RIFAMPIN <=0.5 SENSITIVE Sensitive     Inducible Clindamycin NEGATIVE Sensitive     LINEZOLID 2 SENSITIVE Sensitive     * >=100,000 COLONIES/mL METHICILLIN  RESISTANT STAPHYLOCOCCUS AUREUS  Urine Culture (for pregnant, neutropenic or urologic patients or patients with an indwelling urinary catheter)     Status: Abnormal   Collection Time: 12/06/22  5:41 PM   Specimen: Urine, Clean Catch  Result Value Ref Range Status   Specimen Description   Final    URINE, CLEAN CATCH Performed at [facility], [address] [place], [place] [address]  Special Requests   Final    Normal Performed at [facility], [address] [place], [place] [address]    Culture (A)  Final    70,000 COLONIES/mL ENTEROCOCCUS FAECALIS 70,000 COLONIES/mL YEAST    Report Status 12/09/2022 FINAL  Final   Organism ID, Bacteria ENTEROCOCCUS FAECALIS (A)  Final      Susceptibility   Enterococcus faecalis - MIC*    AMPICILLIN <=2 SENSITIVE Sensitive     NITROFURANTOIN <=16 SENSITIVE Sensitive     VANCOMYCIN 1 SENSITIVE Sensitive     * 70,000 COLONIES/mL ENTEROCOCCUS FAECALIS  Culture, blood (Routine X 2) w Reflex to ID Panel     Status: None   Collection Time: 12/07/22  1:31 PM   Specimen: BLOOD  Result Value Ref Range Status   Specimen Description BLOOD BLOOD RIGHT HAND  Final   Special Requests   Final    BOTTLES DRAWN AEROBIC ONLY Blood Culture adequate volume   Culture   Final    NO GROWTH 5 DAYS Performed at [facility], [address] [place], [place] [address]    Report Status 12/12/2022 FINAL  Final  Culture, blood (Routine X 2) w Reflex to ID Panel     Status: None   Collection Time: 12/07/22  6:41 PM   Specimen: BLOOD  Result Value Ref Range Status   Specimen Description BLOOD BLOOD RIGHT HAND  Final   Special Requests   Final    BOTTLES DRAWN AEROBIC AND ANAEROBIC Blood Culture results may not be optimal due to an inadequate volume of blood received in culture bottles   Culture   Final    NO GROWTH 5 DAYS Performed at [facility], [address] [place], [place] [address]    Report Status 12/12/2022  FINAL  Final  C Difficile Quick Screen (NO PCR Reflex)     Status: None   Collection Time: 12/12/22  4:07 PM   Specimen: STOOL  Result Value Ref Range Status   C Diff antigen NEGATIVE NEGATIVE Final   C Diff toxin NEGATIVE NEGATIVE Final   C Diff interpretation No C. difficile detected.  Final    Comment: Performed at [facility], [address] [place], [place] [address]  Gastrointestinal Panel by PCR , Stool     Status: None   Collection Time: 12/13/22  8:38 AM   Specimen: Stool  Result Value Ref Range Status   Campylobacter species NOT DETECTED NOT DETECTED Final   Plesimonas shigelloides NOT DETECTED NOT DETECTED Final   Salmonella species NOT DETECTED NOT DETECTED Final   Yersinia enterocolitica NOT DETECTED NOT DETECTED Final   Vibrio species NOT DETECTED NOT DETECTED Final   Vibrio cholerae NOT DETECTED NOT DETECTED Final   Enteroaggregative E coli (EAEC) NOT DETECTED NOT DETECTED Final   Enteropathogenic E coli (EPEC) NOT DETECTED NOT DETECTED Final   Enterotoxigenic E coli (ETEC) NOT DETECTED NOT DETECTED Final   Shiga like toxin producing E coli (STEC) NOT DETECTED NOT DETECTED Final   Shigella/Enteroinvasive E coli (EIEC) NOT DETECTED NOT DETECTED Final   Cryptosporidium NOT DETECTED NOT DETECTED Final   Cyclospora cayetanensis NOT DETECTED NOT DETECTED Final   Entamoeba histolytica NOT DETECTED NOT DETECTED Final   Giardia lamblia NOT DETECTED NOT DETECTED Final   Adenovirus F40/41 NOT DETECTED NOT DETECTED Final   Astrovirus NOT DETECTED NOT DETECTED Final   Norovirus GI/GII NOT DETECTED NOT DETECTED Final   Rotavirus A NOT DETECTED NOT DETECTED Final  Sapovirus (I, II, IV, and V) NOT DETECTED NOT DETECTED Final    Comment: Performed at [facility], [address] [place], [place] [address]    Coagulation Studies: No results for input(s): "LABPROT", "INR" in the last 72 hours.   Urinalysis: No results for input(s): "COLORURINE",  "LABSPEC", "PHURINE", "GLUCOSEU", "HGBUR", "BILIRUBINUR", "KETONESUR", "PROTEINUR", "UROBILINOGEN", "NITRITE", "LEUKOCYTESUR" in the last 72 hours.  Invalid input(s): "APPERANCEUR"     Imaging: US Venous Img Lower Bilateral (DVT)  Result Date: 12/17/2022 CLINICAL DATA:  Bilateral lower extremity pain and edema. History of IVC filter placement. Evaluate for DVT. History of left above knee amputation. EXAM: BILATERAL LOWER EXTREMITY VENOUS DOPPLER ULTRASOUND TECHNIQUE: Gray-scale sonography with graded compression, as well as color Doppler and duplex ultrasound were performed to evaluate the lower extremity deep venous systems from the level of the common femoral vein and including the common femoral, femoral, profunda femoral, popliteal and calf veins including the posterior tibial, peroneal and gastrocnemius veins when visible. The superficial great saphenous vein was also interrogated. Spectral Doppler was utilized to evaluate flow at rest and with distal augmentation maneuvers in the common femoral, femoral and popliteal veins. COMPARISON:  Left lower extremity venous Doppler ultrasound-05/04/2021 FINDINGS: RIGHT LOWER EXTREMITY Common Femoral Vein: No evidence of thrombus. Normal compressibility, respiratory phasicity and response to augmentation. Saphenofemoral Junction: No evidence of thrombus. Normal compressibility and flow on color Doppler imaging. Profunda Femoral Vein: No evidence of thrombus. Normal compressibility and flow on color Doppler imaging. Femoral Vein: No evidence of thrombus. Normal compressibility, respiratory phasicity and response to augmentation. Popliteal Vein: No evidence of thrombus. Normal compressibility, respiratory phasicity and response to augmentation. Calf Veins: Appear patent where visualized. Superficial Great Saphenous Vein: No evidence of thrombus. Normal compressibility. Other Findings:  None. LEFT LOWER EXTREMITY Common Femoral Vein: There is hypoechoic nonocclusive  wall thickening/chronic DVT within the left common femoral vein (image 30 and 40)). Saphenofemoral Junction: There is nonocclusive wall thickening/chronic DVT involving the saphenofemoral junction (image 34). Profunda Femoral Vein: There is nonocclusive wall thickening/chronic DVT involving the left deep femoral vein (image 39). Femoral Vein: No evidence of acute or chronic thrombus. Normal compressibility, respiratory phasicity and response to augmentation. Popliteal Vein: N/A Calf Veins: N/A Superficial Great Saphenous Vein: No evidence of thrombus. Normal compressibility. Other Findings:  None. IMPRESSION: 1. No evidence of acute or chronic DVT within the right lower extremity. 2. 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 Electronically Signed   By: Simonne Come M.D.   On: 12/17/2022 13:34     Medications:    sodium chloride Stopped (12/15/22 0035)   sodium chloride Stopped (12/08/22 1823)   sodium chloride     albumin human Stopped (12/17/22 1347)   anticoagulant sodium citrate     norepinephrine (LEVOPHED) Adult infusion Stopped (12/17/22 1000)   vancomycin Stopped (12/16/22 1456)    sodium chloride   Intravenous Once   amiodarone  200 mg Per Tube BID   ascorbic acid  500 mg Per Tube BID   atorvastatin  40 mg Per Tube Daily   chlorhexidine  15 mL Mouth Rinse BID   Chlorhexidine Gluconate Cloth  6 each Topical Q0600   vitamin D3  2,000 Units Per Tube Daily   [START ON 12/18/2022] copper  2 mg Per Tube Daily   droxidopa  300 mg  Per Tube TID WC   epoetin (EPOGEN/PROCRIT) injection  10,000 Units Intravenous Q T,Th,Sa-HD   escitalopram  10 mg Per Tube Daily   famotidine  10 mg Per Tube Daily   feeding supplement (NEPRO CARB STEADY)  1,000 mL Per Tube Q24H   feeding supplement  (PROSource TF20)  60 mL Per Tube Daily   fludrocortisone  0.2 mg Per Tube Daily   free water  30 mL Per Tube Q4H   Gerhardt's butt cream   Topical BID   heparin injection (subcutaneous)  5,000 Units Subcutaneous Q8H   hydrOXYzine  50 mg Per Tube Q6H   insulin aspart  0-6 Units Subcutaneous Q4H   midodrine  20 mg Per Tube TID WC   multivitamin  1 tablet Per Tube QHS   mupirocin ointment   Nasal BID   nutrition supplement (JUVEN)  1 packet Per Tube BID BM   mouth rinse  15 mL Mouth Rinse 4 times per day   phenylephrine  10 mg Per J Tube BID   sodium chloride, acetaminophen **OR** acetaminophen, alteplase, alteplase, anticoagulant sodium citrate, artificial tears, clonazePAM, fentaNYL (SUBLIMAZE) injection, heparin, heparin, ipratropium-albuterol, lidocaine (PF), lidocaine-prilocaine, loperamide HCl, magnesium hydroxide, ondansetron **OR** ondansetron (ZOFRAN) IV, mouth rinse, pentafluoroprop-tetrafluoroeth, sennosides, traZODone  Assessment/ Plan:  [name] is a 72 y.o.  female with past medical history of diabetes, COPD, GERD, hypertension. OA, a fib, and end stage renal disease on hemodialysis. Patient presents to the ED from her nursing facility complaining of altered mental status. She has been admitted for Lower urinary tract infectious disease [N39.0] Altered mental status, unspecified altered mental status type [R41.82] Sepsis due to gram-negative UTI (HCC) [A41.50, N39.0] Sepsis, due to unspecified organism, unspecified whether acute organ dysfunction present (HCC) [A41.9]  [facility]/TTS/Rt Permcath/? 81.3kg  End stage renal disease -Hemodialysis via femoral temp cath TTS schedule. -PermCath placement deferred until WBC count normalizes and infections are cleared. -Some dependent edema present.  UF goal as tolerated  -Significant hypoalbuminemia.  May need IV albumin supplementation during dialysis for hemodynamic stability.  2. Hypotension due to sepsis.  Blood cultures and dialysis cath tip cultures with MRSA. Repeat cultures with no growth. However now with significant leukocytosis.   - requiring vasopressors: norepinephrine.   - continue antibiotics: vancomycin.  Pharmacy assisting with dosing. ID following.    3. Anemia of chronic kidney disease : PRBC transfusion on 8/26, 9/3  - EPO with HD treatments  - Hgb 9.2 today   4. Secondary Hyperparathyroidism:  with hypophosphatemia.   -Phosphorus level has now improved.  5.  Hypokalemia  -4K potassium bath.      LOS: 11 Sumedha Munnerlyn 9/4/20242:53 PM

## 2022-12-17 NOTE — Progress Notes (Signed)
PHARMACY CONSULT NOTE  Pharmacy Consult for Electrolyte Monitoring and Replacement   Recent Labs: Potassium (mmol/L)  Date Value  12/17/2022 3.2 (L)  08/11/2013 3.3 (L)   Magnesium (mg/dL)  Date Value  86/57/8469 2.7 (H)   Calcium (mg/dL)  Date Value  62/95/2841 8.1 (L)   Calcium, Total (mg/dL)  Date Value  32/44/0102 9.2   Albumin (g/dL)  Date Value  72/53/6644 2.4 (L)  08/11/2013 3.1 (L)   Phosphorus (mg/dL)  Date Value  03/47/4259 2.7   Sodium (mmol/L)  Date Value  12/17/2022 132 (L)  08/11/2013 138    Assessment: 72 y.o. female with medical history significant for asthma, COPD, type diabetes mellitus, s/p G-tube, GERD, ESRD on HD, hypertension, osteoarthritis, paroxysmal atrial fibrillation and OSA on CPAP, who presented to the emergency room with acute onset of altered mental status with lethargy and decreased responsiveness, started on CRRT.  CRRT stopped 8/25. line holiday. CVC placed 8/29 and HD performed same day. Patient is on tube feeds.  Last HD 12/16/2022  Pharmacy has been consulted to monitor and replace electrolytes.  Goal of Therapy:  Electrolytes WNL  Plan:  40 mEq KCl per tube x 1 Recheck electrolytes tomorrow morning  Darolyn Rua, PharmD Student [employer]

## 2022-12-17 NOTE — Progress Notes (Signed)
Daily Progress Note   Patient Name: Alexandra Foster       Date: 12/17/2022 DOB: 08/08/1950  Age: 72 y.o. MRN#: [id number] Attending Physician: Janann Colonel, MD Primary Care Physician: Patrecia Pour, MD Admit Date: 12/05/2022  Reason for Consultation/Follow-up: Establishing goals of care  Subjective: Notes and labs reviewed.  Patient is currently resting in bed no distress noted.  No family at bedside.  Spoke with CCM NP regarding patient. As per conversation, goals are set for full code and full scope, and PMT will sign off at this time.  Please consult if needs arise.  Length of Stay: 11  Current Medications: Scheduled Meds:   sodium chloride   Intravenous Once   amiodarone  200 mg Per Tube BID   ascorbic acid  500 mg Per Tube BID   atorvastatin  40 mg Per Tube Daily   chlorhexidine  15 mL Mouth Rinse BID   Chlorhexidine Gluconate Cloth  6 each Topical Q0600   vitamin D3  2,000 Units Per Tube Daily   [START ON 12/18/2022] copper  2 mg Per Tube Daily   droxidopa  300 mg Per Tube TID WC   epoetin (EPOGEN/PROCRIT) injection  10,000 Units Intravenous Q T,Th,Sa-HD   escitalopram  10 mg Per Tube Daily   famotidine  10 mg Per Tube Daily   feeding supplement (NEPRO CARB STEADY)  1,000 mL Per Tube Q24H   feeding supplement (PROSource TF20)  60 mL Per Tube Daily   fludrocortisone  0.2 mg Per Tube Daily   free water  30 mL Per Tube Q4H   Gerhardt's butt cream   Topical BID   heparin injection (subcutaneous)  5,000 Units Subcutaneous Q8H   hydrOXYzine  50 mg Per Tube Q6H   insulin aspart  0-6 Units Subcutaneous Q4H   midodrine  20 mg Per Tube TID WC   multivitamin  1 tablet Per Tube QHS   mupirocin ointment   Nasal BID   nutrition supplement (JUVEN)  1 packet Per  Tube BID BM   mouth rinse  15 mL Mouth Rinse 4 times per day    Continuous Infusions:  sodium chloride Stopped (12/15/22 0035)   sodium chloride Stopped (12/08/22 1823)   sodium chloride     albumin human 25 g (12/17/22 1207)   anticoagulant sodium  citrate     norepinephrine (LEVOPHED) Adult infusion Stopped (12/17/22 1000)   vancomycin Stopped (12/16/22 1456)    PRN Meds: sodium chloride, acetaminophen **OR** acetaminophen, alteplase, alteplase, anticoagulant sodium citrate, artificial tears, clonazePAM, fentaNYL (SUBLIMAZE) injection, heparin, heparin, ipratropium-albuterol, lidocaine (PF), lidocaine-prilocaine, loperamide HCl, magnesium hydroxide, ondansetron **OR** ondansetron (ZOFRAN) IV, mouth rinse, pentafluoroprop-tetrafluoroeth, sennosides, traZODone  Physical Exam Constitutional:      Comments: Eyes closed.   Pulmonary:     Effort: Pulmonary effort is normal.             Vital Signs: BP (!) 117/102   Pulse 64   Temp 98.8 F (37.1 C) (Axillary)   Resp (!) 21   Ht 5\' 4"  (1.626 m)   Wt 89.8 kg   SpO2 98%   BMI 33.98 kg/m  SpO2: SpO2: 98 % O2 Device: O2 Device: Room Air O2 Flow Rate: O2 Flow Rate (L/min): 4 L/min  Intake/output summary:  Intake/Output Summary (Last 24 hours) at 12/17/2022 1320 Last data filed at 12/17/2022 1207 Gross per 24 hour  Intake 3951.91 ml  Output 1895 ml  Net 2056.91 ml   LBM: Last BM Date : 12/17/22 Baseline Weight: Weight: 84.7 kg Most recent weight: Weight: 89.8 kg   Patient Active Problem List   Diagnosis Date Noted   MRSA bacteremia 12/06/2022   Acute metabolic encephalopathy 12/06/2022   Paroxysmal atrial fibrillation (HCC) 12/06/2022   Anxiety and depression 12/06/2022   Type 2 diabetes mellitus with chronic kidney disease, with long-term current use of insulin (HCC) 12/06/2022   ESRD on hemodialysis (HCC) 12/06/2022   Septic shock (HCC) 12/06/2022   Abdominal distension    Left leg cellulitis    Status post  tracheostomy (HCC) 05/18/2021   Subdural hemorrhage (HCC) 05/18/2021   Necrotizing fasciitis (HCC) 05/12/2021   Acute and chronic respiratory failure (acute-on-chronic) (HCC) 05/12/2021   On mechanically assisted ventilation (HCC) 05/12/2021   Shock liver 05/12/2021   Anemia associated with acute blood loss 05/12/2021   Metabolic acidosis 05/12/2021   Encounter for continuous renal replacement therapy (CRRT) for acute renal failure (HCC) 05/12/2021   Acute encephalopathy 05/12/2021   Group A streptococcal infection 05/12/2021   Streptococcal toxic shock syndrome (HCC) 05/12/2021   Atrial fibrillation and flutter (HCC)    Severe sepsis with septic shock (CODE) (HCC) 05/04/2021   Personal history of COVID-19 07/14/2019   Bilateral lower extremity edema 05/03/2018   Lower extremity pain, bilateral 05/03/2018   Diabetes mellitus type 2 in obese 04/23/2018   Osteoarthritis of both knees 01/14/2018   Urge incontinence 03/14/2016   Osteopenia 02/13/2016   OSA (obstructive sleep apnea) 01/08/2016   Allergic rhinitis 04/13/2015   COPD (chronic obstructive pulmonary disease) (HCC) 09/18/2014   Morbid obesity (HCC) 09/18/2014   Esophagitis, reflux 09/18/2014   Essential hypertension 09/21/2006    Palliative Care Assessment & Plan     Recommendations/Plan: PMT will sign off at this time as goals are set for full code/full scope.  Please reconsult if needs arise.  Code Status:    Code Status Orders  (From admission, onward)           Start     Ordered   12/06/22 0124  Full code  Continuous       Question:  By:  Answer:  Consent: discussion documented in EHR   12/06/22 0132           Code Status History     Date Active Date Inactive Code Status Order ID Comments  User Context   05/04/2021 2329 06/13/2021 2307 Full Code [id number]  Jimmye Norman, NP ED   04/30/2019 1934 05/03/2019 1822 Full Code [id number]  Clydia Llano, MD Inpatient   04/29/2019 2048 04/30/2019 1830  Full Code [id number]  Mansy, Vernetta Honey, MD ED       Care plan was discussed with CCM NP  Thank you for allowing the Palliative Medicine Team to assist in the care of this patient.    Morton Stall, NP  Please contact Palliative Medicine Team phone at [phone] for questions and concerns.

## 2022-12-17 NOTE — Plan of Care (Signed)
  Problem: Fluid Volume: Goal: Hemodynamic stability will improve Outcome: Not Progressing   Problem: Clinical Measurements: Goal: Diagnostic test results will improve Outcome: Not Progressing Goal: Signs and symptoms of infection will decrease Outcome: Not Progressing   Problem: Respiratory: Goal: Ability to maintain adequate ventilation will improve Outcome: Progressing   Problem: Education: Goal: Knowledge of General Education information will improve Description: Including pain rating scale, medication(s)/side effects and non-pharmacologic comfort measures Outcome: Not Progressing   Problem: Health Behavior/Discharge Planning: Goal: Ability to manage health-related needs will improve Outcome: Not Progressing   Problem: Clinical Measurements: Goal: Ability to maintain clinical measurements within normal limits will improve Outcome: Not Progressing Goal: Will remain free from infection Outcome: Not Progressing Goal: Diagnostic test results will improve Outcome: Not Progressing Goal: Respiratory complications will improve Outcome: Not Progressing Goal: Cardiovascular complication will be avoided Outcome: Not Progressing   Problem: Activity: Goal: Risk for activity intolerance will decrease Outcome: Not Progressing   Problem: Nutrition: Goal: Adequate nutrition will be maintained Outcome: Not Progressing   Problem: Coping: Goal: Level of anxiety will decrease Outcome: Not Progressing   Problem: Elimination: Goal: Will not experience complications related to bowel motility Outcome: Not Progressing Goal: Will not experience complications related to urinary retention Outcome: Not Progressing   Problem: Pain Managment: Goal: General experience of comfort will improve Outcome: Not Progressing   Problem: Safety: Goal: Ability to remain free from injury will improve Outcome: Not Progressing   Problem: Skin Integrity: Goal: Risk for impaired skin integrity will  decrease Outcome: Not Progressing   Problem: Education: Goal: Ability to describe self-care measures that may prevent or decrease complications (Diabetes Survival Skills Education) will improve Outcome: Not Progressing Goal: Individualized Educational Video(s) Outcome: Not Progressing   Problem: Coping: Goal: Ability to adjust to condition or change in health will improve Outcome: Not Progressing   Problem: Fluid Volume: Goal: Ability to maintain a balanced intake and output will improve Outcome: Not Progressing   Problem: Health Behavior/Discharge Planning: Goal: Ability to identify and utilize available resources and services will improve Outcome: Not Progressing Goal: Ability to manage health-related needs will improve Outcome: Not Progressing   Problem: Metabolic: Goal: Ability to maintain appropriate glucose levels will improve Outcome: Not Progressing   Problem: Nutritional: Goal: Maintenance of adequate nutrition will improve Outcome: Not Progressing Goal: Progress toward achieving an optimal weight will improve Outcome: Not Progressing   Problem: Skin Integrity: Goal: Risk for impaired skin integrity will decrease Outcome: Not Progressing   Problem: Tissue Perfusion: Goal: Adequacy of tissue perfusion will improve Outcome: Not Progressing

## 2022-12-18 DIAGNOSIS — N186 End stage renal disease: Secondary | ICD-10-CM | POA: Diagnosis not present

## 2022-12-18 DIAGNOSIS — A419 Sepsis, unspecified organism: Secondary | ICD-10-CM | POA: Diagnosis not present

## 2022-12-18 DIAGNOSIS — T8571XA Infection and inflammatory reaction due to peritoneal dialysis catheter, initial encounter: Secondary | ICD-10-CM | POA: Diagnosis not present

## 2022-12-18 DIAGNOSIS — R7881 Bacteremia: Secondary | ICD-10-CM

## 2022-12-18 DIAGNOSIS — Z992 Dependence on renal dialysis: Secondary | ICD-10-CM | POA: Diagnosis not present

## 2022-12-18 DIAGNOSIS — R6521 Severe sepsis with septic shock: Secondary | ICD-10-CM | POA: Diagnosis not present

## 2022-12-18 DIAGNOSIS — B9562 Methicillin resistant Staphylococcus aureus infection as the cause of diseases classified elsewhere: Secondary | ICD-10-CM | POA: Diagnosis not present

## 2022-12-18 LAB — RENAL FUNCTION PANEL
Albumin: 2.8 g/dL — ABNORMAL LOW (ref 3.5–5.0)
Anion gap: 9 (ref 5–15)
BUN: 72 mg/dL — ABNORMAL HIGH (ref 8–23)
CO2: 27 mmol/L (ref 22–32)
Calcium: 8.7 mg/dL — ABNORMAL LOW (ref 8.9–10.3)
Chloride: 100 mmol/L (ref 98–111)
Creatinine, Ser: 2.18 mg/dL — ABNORMAL HIGH (ref 0.44–1.00)
GFR, Estimated: 23 mL/min — ABNORMAL LOW (ref >60)
Glucose, Bld: 112 mg/dL — ABNORMAL HIGH (ref 70–99)
Phosphorus: 3 mg/dL (ref 2.5–4.6)
Potassium: 3.5 mmol/L (ref 3.5–5.1)
Sodium: 136 mmol/L (ref 135–145)

## 2022-12-18 LAB — CBC
HCT: 28.1 % — ABNORMAL LOW (ref 36.0–46.0)
Hemoglobin: 8.8 g/dL — ABNORMAL LOW (ref 12.0–15.0)
MCH: 27.8 pg (ref 26.0–34.0)
MCHC: 31.3 g/dL (ref 30.0–36.0)
MCV: 88.9 fL (ref 80.0–100.0)
Platelets: 363 10*3/uL (ref 150–400)
RBC: 3.16 MIL/uL — ABNORMAL LOW (ref 3.87–5.11)
RDW: 21.9 % — ABNORMAL HIGH (ref 11.5–15.5)
WBC: 19.6 10*3/uL — ABNORMAL HIGH (ref 4.0–10.5)
nRBC: 0.7 % — ABNORMAL HIGH (ref 0.0–0.2)

## 2022-12-18 LAB — VANCOMYCIN, RANDOM: Vancomycin Rm: 19 ug/mL

## 2022-12-18 LAB — PROCALCITONIN: Procalcitonin: 1.85 ng/mL

## 2022-12-18 LAB — GLUCOSE, CAPILLARY
Glucose-Capillary: 101 mg/dL — ABNORMAL HIGH (ref 70–99)
Glucose-Capillary: 106 mg/dL — ABNORMAL HIGH (ref 70–99)
Glucose-Capillary: 107 mg/dL — ABNORMAL HIGH (ref 70–99)
Glucose-Capillary: 115 mg/dL — ABNORMAL HIGH (ref 70–99)
Glucose-Capillary: 117 mg/dL — ABNORMAL HIGH (ref 70–99)
Glucose-Capillary: 95 mg/dL (ref 70–99)

## 2022-12-18 LAB — MAGNESIUM: Magnesium: 1.9 mg/dL (ref 1.7–2.4)

## 2022-12-18 MED ORDER — DIPHENHYDRAMINE HCL 25 MG PO CAPS: 25.0000 mg | ORAL_CAPSULE | Freq: Once | ORAL | Status: DC

## 2022-12-18 MED ORDER — DIPHENHYDRAMINE HCL 50 MG/ML IJ SOLN
12.5000 mg | Freq: Once | INTRAMUSCULAR | Status: AC
  Administered 2022-12-18: 12.5 mg via INTRAVENOUS
  Filled 2022-12-18: qty 1

## 2022-12-18 MED ORDER — DIPHENHYDRAMINE HCL 25 MG PO CAPS
25.0000 mg | ORAL_CAPSULE | Freq: Once | ORAL | Status: AC
  Administered 2022-12-18: 25 mg
  Filled 2022-12-18: qty 1

## 2022-12-18 NOTE — Progress Notes (Signed)
Pharmacy Antibiotic Note  Alexandra Foster is a 72 y.o. female admitted on 12/05/2022 with bacteremia.  Bcx 4 of 4 GPC, BCID showing MRSA and Staph epidermidis. Patient started on CRRT 8/24 - stopped 8/25. Pharmacy has been consulted for daptomycin dosing and monitoring.   Today, 12/18/2022 Day # 13 vancomycin Renal: on HD prior to admission, briefly on CRRT 8/24-8/25. Her previous HD jugular catheter was removed  8/24 d/t concern for infection.  She was on line holiday (femoral line placed for CRRT removed 8/27). CVC inserted 8/29 as temp HD catheter and HD was performed. Plan for permacath by vascular surgery 9/6 Still makes some Urine Afebrile WBC 19.6 8/23 Bcx MRSA, repeat Bcx 8/25 NGTD 8/24 cath tip: MRSA  Date HD (Y/N) Level Plan  8/24 CRRT  Vanc 1750 mg  8/25 CRRT  Vanc 1000 mg  8/26 N 22   8/27 N  Vanc 750 mg  8/28 N    8/29 Y 22 Vanc 1000 mg  8/30 N    8/31 Y  Vanc 1000 mg  9/1 N    9/3 Y  Vanc 1000mg   9/5   19     Plan: Patient continues to have significant pruritus after stopping unasyn.  Will change vancomycin to daptomycin to see if it helps. HD will not be performed today as per her normal HD schedule.  Plan to do HD tomorrow after permacath is placed.  No need to give vancomycin or daptomycin today.   Will plan to start daptomycin 500mg  IV (7.2 mg/kg) IV qHD.  Will need to follow HD schedule closely until back on routine schedule Check CK tomorrow then at least weekly On atorvastatin 40mg  daily   Height: 5\' 4"  (162.6 cm) Weight: 91.2 kg (201 lb) IBW/kg (Calculated) : 54.7  Temp (24hrs), Avg:98.9 F (37.2 C), Min:97.9 F (36.6 C), Max:99.6 F (37.6 C)  Recent Labs  Lab 12/14/22 0440 12/15/22 0521 12/16/22 0531 12/16/22 1208 12/16/22 2231 12/17/22 0637 12/18/22 0449  WBC 40.2* 22.6* 18.2*  --   --  19.1* 19.6*  CREATININE 1.72* 2.40* 2.77*  --   --  1.83* 2.18*  LATICACIDVEN  --   --   --  1.4 1.5  --   --   VANCORANDOM  --   --   --   --   --   --   19    Estimated Creatinine Clearance: 24.8 mL/min (A) (by C-G formula based on SCr of 2.18 mg/dL (H)).    No Known Allergies  Antimicrobials this admission: 8/23 Azithromycin / CRO x 1 8/24 Cefepime >> 8/26 8/24 Vancomycin >>   Microbiology results: 8/23 BCx: 4/4 MRSA 8/23 Cath tip: MRSA 8/24 MRSA PCR: Positive   Thank you for allowing pharmacy to be a part of this patient's care.  Juliette Alcide, PharmD, BCPS, BCIDP Work Cell: [phone] 12/18/2022 3:52 PM

## 2022-12-18 NOTE — Progress Notes (Signed)
NAME:  Alexandra Foster, MRN:  [id number], DOB:  [birth year], LOS: 12 ADMISSION DATE:  12/05/2022, CONSULTATION DATE:  12/06/2022 REFERRING MD:  Dr. Wynelle Link, CHIEF COMPLAINT:  Hypotension   Brief Pt Description / Synopsis:  72 y.o female with PMHx significant for ESRD on HD admitted with Acute Metabolic Encephalopathy in setting of Severe Sepsis with Septic Shock due to MRSA Bacteremia from infected right chest Permcath, along with Enterococcus Faecalis UTI.  History of Present Illness:  Alexandra Foster is a 72 y.o. female with past medical history significant for asthma, COPD, type diabetes mellitus, s/p G-tube, GERD, ESRD on HD, hypertension, osteoarthritis, paroxysmal atrial fibrillation and OSA on CPAP, who presented to [facility] ED on 12/05/22 from [facility] due to altered mental status with lethargy and decreased responsiveness.  There was a concern about aspiration pneumonia at his facility as the patient had an episode of nausea and vomiting and was reportedly coughing per her sister without wheezing or dyspnea or hypoxia.  The patient's temperature was 102.2 when EMS arrived to this facility.  The patient was not responding to questions throughout transport.  EMS noted the patient having slurred speech and significant weakness per family (family had not seen her since last week).  Code stroke was called upon arrival to the ED. teleneurology evaluated the patient, was not considered a candidate for thrombolytics given unclear last known well time, along with low suspicion for stroke given significantly global symptoms and multiple metabolic derangements and sepsis contributing to encephalopathy.  ED Course: Initial Vital Signs: Temperature 101.4 F orally, pulse 78, respiratory rate 19, blood pressure 125/70, SpO2 99% on room air Significant Labs: hyponatremia 128 and hypochloremia of 90 with blood glucose of 197, BUN of 67 and creatinine 4.53 with CO2 of 22 and anion gap 16. Alk phos was  157 and albumin 3.3, AST 66 and ALT 87 with total obtained of 8.6. Lactic acid was 2.3 and later 2.9. CBC showed leukocytosis of 21.4 with neutrophilia and anemia. Respiratory panel including influenza A, B, RSV and COVID-19 PCR came back negative. UA was positive for UTI alcohol level was less than 10. Urine drug screen was positive for tricyclic's.  Imaging Chest X-ray>>IMPRESSION: Mild vascular congestion likely related to volume overload. CT Head w/o contrast>>IMPRESSION: 1. No acute intracranial abnormality. 2. ASPECTS is 10. Medications Administered: IV Rocephin and Zithromax as well as 3 L bolus of IV normal saline   Hospitalist were asked to admit for further workup and treatment.  Nephrology consulted for hemodialysis.  Please see "significant hospital events" section below for full detailed hospital course.  Pertinent  Medical History   Past Medical History:  Diagnosis Date   AKI (acute kidney injury) (HCC)    a. 04/2021 in setting of bacteremia/shock.   Arthritis    Asthma    Bacteremia    a. 04/2021 S pyogenes bacteremia in setting of lower ext cellulitis.   COPD (chronic obstructive pulmonary disease) (HCC)    Diabetes mellitus without complication (HCC)    Endometriosis    GERD (gastroesophageal reflux disease)    History of echocardiogram    a. 07/2013 Echo: EF 55-60%, impaired relaxation, mild TR; b. 04/2021 Echo: EF 50-55%, mild LVH, nl RV fxn, mild BAE, Ao sclerosis w/o stenosis.   Hypertension    Obesity    PAF (paroxysmal atrial fibrillation) (HCC)    a. 04/2021 in setting of septic shock/cellulitis.   Sleep apnea    CPAP    Micro Data:  8/23:  SARS-CoV-2/RSV/Flu PCR>>negative 8/23: Blood cultures>> 4/4 bottles with MRSA & Staph epidermidis 8/23: MRSA PCR>> positive 8/23: Urine>> Enterococcus Faecalis 8/24: Permcath tip>> MRSA 8/25: Repeat Blood cultures>> no growth 8/30: Cdiff>>negative  8/31: GI Panel by PCR>>negative   Antimicrobials:    Anti-infectives (From admission, onward)    Start     Dose/Rate Route Frequency Ordered Stop   12/16/22 1400  vancomycin (VANCOCIN) IVPB 1000 mg/200 mL premix        1,000 mg 200 mL/hr over 60 Minutes Intravenous Every T-Th-Sa (Hemodialysis) 12/16/22 1229     12/13/22 1800  vancomycin (VANCOCIN) IVPB 1000 mg/200 mL premix        1,000 mg 200 mL/hr over 60 Minutes Intravenous  Once 12/13/22 1158 12/13/22 1820   12/11/22 2345  Ampicillin-Sulbactam (UNASYN) 3 g in sodium chloride 0.9 % 100 mL IVPB  Status:  Discontinued        3 g 200 mL/hr over 30 Minutes Intravenous Every 12 hours 12/11/22 2252 12/15/22 2041   12/11/22 1800  vancomycin (VANCOCIN) IVPB 1000 mg/200 mL premix        1,000 mg 200 mL/hr over 60 Minutes Intravenous  Once 12/11/22 1025 12/12/22 0635   12/09/22 1800  vancomycin (VANCOREADY) IVPB 750 mg/150 mL        750 mg 150 mL/hr over 60 Minutes Intravenous  Once 12/09/22 1423 12/09/22 1848   12/08/22 1040  vancomycin variable dose per unstable renal function (pharmacist dosing)  Status:  Discontinued         Does not apply See admin instructions 12/08/22 1041 12/16/22 1229   12/07/22 1400  vancomycin (VANCOCIN) IVPB 1000 mg/200 mL premix  Status:  Discontinued        1,000 mg 200 mL/hr over 60 Minutes Intravenous Every 24 hours 12/06/22 1234 12/08/22 1041   12/06/22 2200  cefTRIAXone (ROCEPHIN) 2 g in sodium chloride 0.9 % 100 mL IVPB  Status:  Discontinued        2 g 200 mL/hr over 30 Minutes Intravenous Every 24 hours 12/06/22 0132 12/06/22 1223   12/06/22 1500  ceFEPIme (MAXIPIME) 2 g in sodium chloride 0.9 % 100 mL IVPB  Status:  Discontinued        2 g 200 mL/hr over 30 Minutes Intravenous Every 12 hours 12/06/22 1252 12/08/22 1040   12/06/22 1315  vancomycin (VANCOREADY) IVPB 1750 mg/350 mL        1,750 mg 175 mL/hr over 120 Minutes Intravenous  Once 12/06/22 1224 12/06/22 1724   12/05/22 2130  cefTRIAXone (ROCEPHIN) 2 g in sodium chloride 0.9 % 100 mL IVPB   Status:  Discontinued        2 g 200 mL/hr over 30 Minutes Intravenous Every 24 hours 12/05/22 2115 12/06/22 0202   12/05/22 2130  azithromycin (ZITHROMAX) 500 mg in sodium chloride 0.9 % 250 mL IVPB  Status:  Discontinued        500 mg 250 mL/hr over 60 Minutes Intravenous Every 24 hours 12/05/22 2115 12/06/22 1223        Significant Hospital Events: Including procedures, antibiotic start and stop dates in addition to other pertinent events   8/23: Presented from [facility] due to AMS, Code Stroke called due to slurred speech and weakness. Deemed not a candidate for thrombolytics due to no clear last known normal time.  Admitted by [facility] with Nephrology consultation for dialysis. 8/24: Rapid response called due to Hypotension prior to/during Hemodialysis.  Transfer to ICU for peripheral vasopressors and initiation of  CRRT.  PCCM consulted to assist with vasopressors.  Blood cultures + for MRSA & Staph Epi. ID consulted. Vascular Surgery consulted, permcath removed.  Temporary HD catheter placed. 8/24: this morning with pronounce left facial droop and dysarthria, remains on CRRT on Levo @8  keep MAP>55 12/08/22: patient with multispecies sepsis, removing central line today due to bacteremia. Holding HD as per nephrology team due to shock.  12/09/22-patient with loose watery BM today, she does have skin breakdown over abd pannus.  She had re-initiation of her psychiatric medications today.  S/p pRBC transfusion overnight. Electrolytes are being repleted.  On room air but requires levophed still.  12/10/22- patient laying in bed with no acute distress.  Levophed weaned to 74mcg/kg/min, afebrile on room air.  Handling NGT feeds well.  Purewick with no UOP this am. 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  12/11/22- patient off levophed , requires HD today, will place catheter per renal team.  Wound care to see for anal wound.  With episode of emesis during HD and concern for possible  aspiration. 12/12/22- Back on low dose Levophed, weaning as able. Worsening Leukocytosis, ID plans to check for C.diff.  Ongoing discussions of when to remove newly placed temporary HD catheter. 12/13/22- Remains on levophed gtt despite scheduled midodrine and droxidopa.  HD session completed 12/14/22- No acute events overnight pt remains on levophed gtt.  Received HD. 12/15/22- No acute events overnight, remains on Levophed.  Increase Droxidopa to 300 mg TID. Leukocytosis improving. 12/16/22- No events overnight.  Remains on Levophed (less than yesterday, down to 5 mcg).  Will give 1 unit blood with HD today for Hgb 7.1.  Increase Midodrine to 20 mg TID.  Consult Cardiology for TEE, however pts family declined to proceed with TEE given possible risk associated with procedure  12/17/22: Pt remains on levophed gtt @2mcg /min.  US Venous Bilateral Lower Extremities revealed no evidence of acute or chronic DVT within the right lower extremity. 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  Will discuss permanent dialysis access with nephrology and vascular surgery today.  If pt remains off levophed gtt will transition to TRH to pick up in the am   Interim History / Subjective:  As outlined above under significant   Objective   Blood pressure (!) 91/51, pulse 67, temperature 99.6 F (37.6 C), temperature source Axillary, resp. rate 13, height 5\' 4"  (1.626 m), weight 91.2 kg, SpO2 95%.        Intake/Output Summary (Last 24 hours) at 12/18/2022 0838 Last data filed at 12/18/2022 0759 Gross per 24 hour  Intake 1999.11 ml  Output 345 ml  Net 1654.11 ml   Filed Weights   12/16/22 1145 12/16/22 1517  12/18/22 0500  Weight: 89.8 kg 89.8 kg 91.2 kg    Examination: General: Acute on chronically ill-appearing obese female, laying in bed, on room air NAD HENT: Atraumatic, normocephalic, neck supple, difficult to assess JVD due to body habitus Lungs: Diminished throughout, even, non labored Cardiovascular: NSR, s1s2, no m/r/g, 2+ radial/1+ distal pulses, 1+ lower extremity edema  Abdomen: +BS x4, obese, soft, non tender, non distended, PEG tube in place with dressing clean dry and intact Extremities: Prior left AKA, anasarca, RLE pain  Neuro: Alert, disoriented to situation  at times, following commands, moves extremities to command (mild to moderate dysarthria, slight left facial droop which family reports is baseline) GU: Purewick in place   Resolved Hospital Problem list     Assessment & Plan:   #Septic Shock #Chronic Hypotension (on midodrine outpatient) #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 (IVC filter placed 05/29/21) PMHx: Paroxsymal Atrial Fibrillation, HTN Echocardiogram 12/07/2022: LVEF 60-65%, RV systolic function low normal, RV size mildly enlarged, trivial mitral regurgitation - Continuous cardiac monitoring - Maintain MAP >55 per Nephrology - Vasopressors as needed to maintain MAP goal - Increase Midodrine to 20 mg TID, continue Droxidopa to 300 mg TID - Lactic acid previously normalized - Repeat Cortisol is 15.5 (previously 35.9) ~ continue Florinef - TSH within normal limits   - Continue outpatient amiodarone  #Severe Sepsis due to MRSA BACTEREMIA due to infected Permcath & Enterococcus Faecalis UTI #Suspected Aspiration ~ TREATED Permcath was removed on 8/24 2D Echocardiogram without evidence of vegetations - Trend WBC and monitor fever curve  - PCT continues to trend down  - ID following appreciate input ~continue vancomycin pending cultures & sensitivities (completed 5 days of Unasyn) - ID recommends TEE ~ pts family have decided NOT to proceed with TEE  due to concerns of pt receiving sedation for the procedure and ongoing need for levophed.  They understand without TEE this will extend duration of abx therapy per Cardiology progress note on 09/03~ ID recommends 6 weeks of Vancomycin if unable to undergo TEE - Ongoing discussions of when to remove newly placed Temporary femoral HD catheter (placed on 8/29).  She will need Permcath placement   #ESRD on Hemodialysis #Mild Hyponatremia #Hypokalemia  - Trend BMP  - Ensure adequate renal perfusion - Avoid nephrotoxic agents as able - Monitor I&O's / urinary output - Replace electrolytes as indicated ~ Pharmacy following for assistance with electrolyte replacement - Nephrology following, appreciate input: HD per recommendations  #COPD without acute exacerbation #Concern for aspiration~TREATED #OSA, uses CPAP at home - Supplemental O2 as needed to maintain O2 sats 88 to 92% - CPAP qhs - Follow intermittent CXR & ABG as needed - Bronchodilators prn - Pulmonary toilet as able - ABX as above  #Anemia of chronic kidney disease - Trend CBC - Monitor for s/sx of bleeding  - VTE prophylaxis: subcutaneous heparin  - Transfuse for Hgb <7   #Diarrhea ~ IMPROVED Cdiff 08/30: negative  - Continue rectal tube  - Prn imodium  #Severe irritant dermatitis at abdominal folds and buttocks  - Turn q2hrs  - Wound care consulted appreciate input   #Type 2 diabetes mellitus - CBG's q4h; Target range of 140 to 180 - SSI - Follow ICU Hypo/Hyperglycemia protocol - Hold outpatient metformin  #Acute metabolic encephalopathy in setting of sepsis and multiple metabolic derangements~improving  #? TIA (left sided weakness, left facial droop) PMHx: Depression and Anxiety CT Head 8/23 negative for acute intracranial abnormality Code stroke called 8/23, deemed not a candidate for thrombolytics given unclear last known well time CTA head and neck reviewed and shows no acute intracranial abnormality or  LVO -Evaluated by inpatient Neurology on 8/25 ~ Recommendations included: "-Appreciate neurosurgery confirmation that anticoagulation can be resumed from their perspective, secure chat sent to neurosurgeon on-call -Echocardiogram pending, may need TEE, if TTE is negative to definitively exclude endocarditis (defer to primary team, cardiology and infectious disease on risk/benefit) -Anticoagulation for stroke risk prevention once endocarditis is ruled out or resolved, and after neurosurgical clearance -LDL goal less than  70, A1c goal less than 7% (A1c and lipid panel ordered), please adjust medications as needed -Carotid ultrasound -Outpatient follow-up for OSA/CPAP versus inpatient trial as this can increase stroke risk if untreated -Outpatient follow-up for pancreatic lesion as malignancy can increase stroke risk -Outpatient follow-up for IVC filter, could potentially be removed if anticoagulation is reinstituted" - Treatment of sepsis and metabolic derangements as outlined above - Provide supportive care - Promote normal sleep/wake cycle and family presence - Avoid sedating medications as able - Continue outpatient hydroxyzine, lexapro, and prn klonopin - PT/OT  Patient is critically ill with bacteremia and shock.  Prognosis is guarded, high risk for further decompensation, cardiac arrest and death.  Given current illness superimposed on advanced age and multiple chronic comorbidities, overall long-term prognosis is poor.  Recommend DNR status.  Palliative Care was consulted but has since signed off as pt and family very clear they wish for full code and all aggressive measures.  Best Practice (right click and "Reselect all SmartList Selections" daily)   Diet/type: NPO, tube feeds DVT prophylaxis: prophylactic heparin  GI prophylaxis: Pepcid Lines: Right femoral trialysis and is still needed Foley:  N/A Code Status:  full code Last date of multidisciplinary goals of care discussion  [12/18/22]  9/05: Will update pts daughter when she arrives at bedside  Labs   CBC: Recent Labs  Lab 12/14/22 0440 12/15/22 0521 12/16/22 0531 12/17/22 0637 12/18/22 0449  WBC 40.2* 22.6* 18.2* 19.1* 19.6*  HGB 7.9* 7.4* 7.1* 9.2* 8.8*  HCT 25.3* 25.2* 24.0* 28.7* 28.1*  MCV 87.5 91.3 92.0 85.2 88.9  PLT 328 353 385 357 363    Basic Metabolic Panel: Recent Labs  Lab 12/12/22 0512 12/13/22 0428 12/14/22 0440 12/15/22 0521 12/16/22 0531 12/17/22 0637 12/18/22 0449  NA 138   < > 136 137 136 132* 136  K 3.9   < > 3.6 3.4* 3.4* 3.2* 3.5  CL 96*   < > 98 101 99 99 100  CO2 25   < > 27 29 27 27 27   GLUCOSE 90   < > 160* 159* 148* 127* 112*  BUN 67*   < > 43* 61* 75* 51* 72*  CREATININE 2.42*   < > 1.72* 2.40* 2.77* 1.83* 2.18*  CALCIUM 8.9   < > 8.2* 8.1* 8.5* 8.1* 8.7*  MG 1.9  --  1.5* 2.7*  --   --  1.9  PHOS 2.0*   < > 2.3* 3.9 4.1 2.7 3.0   < > = values in this interval not displayed.   GFR: Estimated Creatinine Clearance: 24.8 mL/min (A) (by C-G formula based on SCr of 2.18 mg/dL (H)). Recent Labs  Lab 12/14/22 0440 12/15/22 0521 12/16/22 0531 12/16/22 1208 12/16/22 2231 12/17/22 0637 12/18/22 0449  PROCALCITON 3.18  --   --   --   --  1.90 1.85  WBC 40.2* 22.6* 18.2*  --   --  19.1* 19.6*  LATICACIDVEN  --   --   --  1.4 1.5  --   --     Liver Function Tests: Recent Labs  Lab 12/14/22 0440 12/15/22 0521 12/16/22 0531 12/17/22 0637 12/18/22 0449  ALBUMIN 2.1* 1.9* 2.5* 2.4* 2.8*   No results for input(s): "LIPASE", "AMYLASE" in the last 168 hours. No results for input(s): "AMMONIA" in the last 168 hours.  ABG    Component Value Date/Time   PHART 7.40 05/14/2021 0341   PCO2ART 45 05/14/2021 0341   PO2ART 129 (H) 05/14/2021 0341  HCO3 27.9 05/14/2021 0341   ACIDBASEDEF 12.8 (H) 05/11/2021 0603   O2SAT 98.9 05/14/2021 0341     Coagulation Profile: No results for input(s): "INR", "PROTIME" in the last 168 hours.   Cardiac Enzymes: No  results for input(s): "CKTOTAL", "CKMB", "CKMBINDEX", "TROPONINI" in the last 168 hours.  HbA1C: Hgb A1c MFr Bld  Date/Time Value Ref Range Status  12/07/2022 02:13 AM 5.2 4.8 - 5.6 % Final    Comment:    (NOTE) Pre diabetes:          5.7%-6.4%  Diabetes:              >6.4%  Glycemic control for   <7.0% adults with diabetes   05/05/2021 04:53 AM 6.7 (H) 4.8 - 5.6 % Final    Comment:    (NOTE)         Prediabetes: 5.7 - 6.4         Diabetes: >6.4         Glycemic control for adults with diabetes: <7.0     CBG: Recent Labs  Lab 12/17/22 1603 12/17/22 2016 12/17/22 2347 12/18/22 0331 12/18/22 0726  GLUCAP 110* 119* 92 106* 107*    Review of Systems:   Positives in BOLD: Gen: fever, chills, weight change, fatigue, night sweats HEENT: Denies blurred vision, double vision, hearing loss, tinnitus, sinus congestion, rhinorrhea, sore throat, neck stiffness, dysphagia PULM: Denies shortness of breath, cough, sputum production, hemoptysis, wheezing CV: Denies chest pain, edema, orthopnea, paroxysmal nocturnal dyspnea, palpitations GI: Denies abdominal pain, nausea, vomiting, diarrhea, hematochezia, melena, constipation, change in bowel habits GU: Denies dysuria, hematuria, polyuria, oliguria, urethral discharge Endocrine: Denies hot or cold intolerance, polyuria, polyphagia or appetite change Derm: rash, itching, dry skin, scaling or peeling skin change Heme: Denies easy bruising, bleeding, bleeding gums Neuro: Denies headache, numbness, weakness, slurred speech, loss of memory or consciousness   Past Medical History:  She,  has a past medical history of AKI (acute kidney injury) (HCC), Arthritis, Asthma, Bacteremia, COPD (chronic obstructive pulmonary disease) (HCC), Diabetes mellitus without complication (HCC), Endometriosis, GERD (gastroesophageal reflux disease), History of echocardiogram, Hypertension, Obesity, PAF (paroxysmal atrial fibrillation) (HCC), and Sleep apnea.    Surgical History:   Past Surgical History:  Procedure Laterality Date   ABDOMINAL HYSTERECTOMY     APPLICATION OF WOUND VAC Left 05/17/2021   Procedure: APPLICATION OF WOUND VAC/WOUND VAC EXCHANGE-Matrix Myriad;  Surgeon: Carolan Shiver, MD;  Location: [facility] OR[facility];  Service: General;  Laterality: Left;   APPLICATION OF WOUND VAC  05/24/2021   Procedure: APPLICATION OF WOUND VAC;  Surgeon: Carolan Shiver, MD;  Location: [facility];  Service: General;;   APPLICATION OF WOUND VAC  05/10/2021   Procedure: APPLICATION OF WOUND VAC;  Surgeon: Carolan Shiver, MD;  Location: [facility];  Service: General;;   COLONOSCOPY  10/29/2006   Dr Servando Snare   COLONOSCOPY WITH PROPOFOL N/A 11/05/2016   Procedure: COLONOSCOPY WITH PROPOFOL;  Surgeon: Earline Mayotte, MD;  Location: [facility];  Service: Endoscopy;  Laterality: N/A;   INCISION AND DRAINAGE OF WOUND Left 05/24/2021   Procedure: IRRIGATION AND DEBRIDEMENT LEFT LEG;  Surgeon: Carolan Shiver, MD;  Location: [facility];  Service: General;  Laterality: Left;   INCISION AND DRAINAGE OF WOUND Left 05/10/2021   Procedure: IRRIGATION AND DEBRIDEMENT LEFT LEG;  Surgeon: Carolan Shiver, MD;  Location: [facility];  Service: General;  Laterality: Left;   IR FLUORO GUIDE CV LINE RIGHT  06/12/2021   IR PERC TUN PERIT CATH WO PORT  S&I /IMAG  06/12/2021   IR REPLC GASTRO/COLONIC TUBE PERCUT W/FLUORO  06/12/2021   IVC FILTER INSERTION N/A 05/29/2021   Procedure: IVC FILTER INSERTION;  Surgeon: Renford Dills, MD;  Location: [facility];  Service: Cardiovascular;  Laterality: N/A;   MINOR GRAFT APPLICATION  05/24/2021   Procedure: Myriad Matrix  APPLICATION;  Surgeon: Carolan Shiver, MD;  Location: [facility];  Service: General;;   NASAL SINUS SURGERY  2002   Dr Chestine Spore   PEG PLACEMENT N/A 05/28/2021   Procedure: PERCUTANEOUS ENDOSCOPIC GASTROSTOMY (PEG) PLACEMENT;  Surgeon: Sung Amabile, DO;  Location: [facility];  Service:  General;  Laterality: N/A;  TRAVEL CASE   TRACHEOSTOMY TUBE PLACEMENT N/A 05/17/2021   Procedure: TRACHEOSTOMY;  Surgeon: Geanie Logan, MD;  Location: [facility];  Service: ENT;  Laterality: N/A;   WOUND DEBRIDEMENT Left 05/07/2021   Procedure: DEBRIDEMENT WOUND;  Surgeon: Carolan Shiver, MD;  Location: [facility];  Service: General;  Laterality: Left;     Social History:   reports that she quit smoking about 21 years ago. Her smoking use included cigarettes. She started smoking about 61 years ago. She has a 80 pack-year smoking history. She quit smokeless tobacco use about 21 years ago.  Her smokeless tobacco use included snuff. She reports that she does not drink alcohol and does not use drugs.   Family History:  Her family history includes Alcohol abuse in her brother; Breast cancer (age of onset: 90) in her sister; Congestive Heart Failure in her mother; Coronary artery disease (age of onset: 52) in her father; Heart disease in her brother; Varicose Veins in her brother.   Allergies No Known Allergies   Home Medications  Prior to Admission medications   Medication Sig Start Date End Date Taking? Authorizing Provider  amiodarone (PACERONE) 200 MG tablet Place 1 tablet (200 mg total) into feeding tube 2 (two) times daily. 06/13/21  Yes Erin Fulling, MD  ascorbic acid (VITAMIN C) 500 MG tablet Place 1 tablet (500 mg total) into feeding tube 2 (two) times daily. 06/13/21  Yes Kasa, Wallis Bamberg, MD  busPIRone (BUSPAR) 5 MG tablet Take 5 mg by mouth 2 (two) times daily. 12/04/22  Yes [provider]  cholecalciferol (VITAMIN D) 25 MCG tablet Place 2 tablets (2,000 Units total) into feeding tube daily. 06/14/21  Yes Kasa, Wallis Bamberg, MD  clonazePAM (KLONOPIN) 0.5 MG tablet Take 0.25-0.5 mg by mouth 2 (two) times daily. 0.25 mg every morning and 0.5 mg at bedtime for Agitation 11/18/22  Yes [provider]  escitalopram (LEXAPRO) 10 MG tablet Place 1 tablet (10 mg total) into feeding tube daily.  06/14/21  Yes Erin Fulling, MD  hydrOXYzine (ATARAX) 50 MG tablet Take 100 mg by mouth 2 (two) times daily. 12/02/22  Yes [provider]  lip balm (BLISTEX) OINT Apply 1 application topically 3 (three) times daily. 06/13/21  Yes Erin Fulling, MD  loperamide (IMODIUM) 2 MG capsule Take 1 capsule (2 mg total) by mouth as needed for diarrhea or loose stools. 06/13/21  Yes Erin Fulling, MD  midodrine (PROAMATINE) 5 MG tablet Place 3 tablets (15 mg total) into feeding tube 3 (three) times daily with meals. 06/13/21  Yes Erin Fulling, MD  multivitamin (RENA-VIT) TABS tablet Place 1 tablet into feeding tube at bedtime. 06/13/21  Yes Erin Fulling, MD  omeprazole (PRILOSEC) 20 MG capsule Take 20 mg by mouth daily. 11/12/22  Yes [provider]  sennosides (SENOKOT) 8.8 MG/5ML syrup Place 10 mLs into feeding  tube at bedtime as needed for moderate constipation. 06/13/21  Yes Erin Fulling, MD  artificial tears (LACRILUBE) OINT ophthalmic ointment Place into both eyes every 4 (four) hours as needed for dry eyes. Patient not taking: Reported on 12/06/2022 06/13/21   Erin Fulling, MD  chlorhexidine gluconate, MEDLINE KIT, (PERIDEX) 0.12 % solution 15 mLs by Mouth Rinse route 2 (two) times daily. Patient not taking: Reported on 12/06/2022 06/13/21   Erin Fulling, MD  collagenase (SANTYL) ointment Apply topically daily. Patient not taking: Reported on 12/06/2022 06/14/21   Erin Fulling, MD  dextrose 5 % SOLN 100 mL with copper chloride 0.4 MG/ML SOLN 2 mg Inject 2 mg into the vein daily. 06/14/21   Erin Fulling, MD  ipratropium-albuterol (DUONEB) 0.5-2.5 (3) MG/3ML SOLN Take 3 mLs by nebulization 3 (three) times daily. Patient not taking: Reported on 12/06/2022 06/13/21   Erin Fulling, MD  lactobacillus (FLORANEX/LACTINEX) PACK Place 1 packet (1 g total) into feeding tube 3 (three) times daily with meals. Patient not taking: Reported on 12/06/2022 06/13/21   Erin Fulling, MD  pantoprazole sodium (PROTONIX) 40 mg Place 40 mg  into feeding tube daily. Patient not taking: Reported on 12/06/2022 06/13/21   Erin Fulling, MD  Scheduled Meds:  sodium chloride   Intravenous Once   amiodarone  200 mg Per Tube BID   ascorbic acid  500 mg Per Tube BID   atorvastatin  40 mg Per Tube Daily   chlorhexidine  15 mL Mouth Rinse BID   Chlorhexidine Gluconate Cloth  6 each Topical Q0600   vitamin D3  2,000 Units Per Tube Daily   copper  2 mg Per Tube Daily   diphenhydrAMINE  25 mg Per Tube Once   droxidopa  300 mg Per Tube TID WC   epoetin (EPOGEN/PROCRIT) injection  10,000 Units Intravenous Q T,Th,Sa-HD   escitalopram  10 mg Per Tube Daily   famotidine  10 mg Per Tube Daily   feeding supplement (NEPRO CARB STEADY)  1,000 mL Per Tube Q24H   feeding supplement (PROSource TF20)  60 mL Per Tube Daily   fludrocortisone  0.2 mg Per Tube Daily   free water  30 mL Per Tube Q4H   heparin injection (subcutaneous)  5,000 Units Subcutaneous Q8H   hydrOXYzine  50 mg Per Tube Q6H   insulin aspart  0-6 Units Subcutaneous Q4H   midodrine  20 mg Per Tube TID WC   multivitamin  1 tablet Per Tube QHS   mupirocin ointment   Nasal BID   nutrition supplement (JUVEN)  1 packet Per Tube BID BM   mouth rinse  15 mL Mouth Rinse 4 times per day   phenylephrine  10 mg Per J Tube BID   Continuous Infusions:  sodium chloride Stopped (12/15/22 0035)   sodium chloride Stopped (12/08/22 1823)   sodium chloride     anticoagulant sodium citrate     norepinephrine (LEVOPHED) Adult infusion Stopped (12/17/22 1000)   vancomycin Stopped (12/16/22 1456)   PRN Meds:.sodium chloride, acetaminophen **OR** acetaminophen, alteplase, alteplase, anticoagulant sodium citrate, artificial tears, clonazePAM, fentaNYL (SUBLIMAZE) injection, heparin, heparin, ipratropium-albuterol, lidocaine (PF), lidocaine-prilocaine, loperamide HCl, magnesium hydroxide, ondansetron **OR** ondansetron (ZOFRAN) IV, mouth rinse, pentafluoroprop-tetrafluoroeth, sennosides,  traZODone   Critical care time: 32 minutes    Zada Girt, AGNP  Pulmonary/Critical Care Pager [phone] (please enter [phone]) PCCM Consult Pager [phone] (please enter [phone])

## 2022-12-18 NOTE — Progress Notes (Addendum)
[facility]  ROUNDING NOTE   Subjective:   Patient remains critically ill.    Levophed held overnight  Right femoral temp cath in place  Nocturnal Tube feeds via G tube at 21ml/hr    Objective:  Vital signs in last 24 hours:  Temp:  [97.9 F (36.6 C)-99.6 F (37.6 C)] 99.6 F (37.6 C) (09/05 0400) Pulse Rate:  [61-83] 61 (09/05 0900) Resp:  [13-25] 19 (09/05 0900) BP: (81-162)/(37-125) 93/44 (09/05 0900) SpO2:  [93 %-99 %] 97 % (09/05 0900) Weight:  [91.2 kg] 91.2 kg (09/05 0500)  Weight change: 1.373 kg Filed Weights   12/16/22 1145 12/16/22 1517 12/18/22 0500  Weight: 89.8 kg 89.8 kg 91.2 kg    Intake/Output: I/O last 3 completed shifts: In: 3015 [I.V.:147.7; Other:400; NG/GT:2197.1; IV Piggyback:270.2] Out: 545 [Urine:500; Stool:45]   Intake/Output this shift:  Total I/O In: 643.8 [Other:360; NG/GT:283.8] Out: 100 [Urine:100]  Physical Exam: General: Critically ill  Head: Normocephalic, atraumatic. Moist oral mucosal membranes  Eyes: Anicteric  Lungs:  Diminished bilaterally   Heart: regular   Abdomen:  Soft, nontender  Extremities:  1+ peripheral edema.  Neurologic: Alert to self and place,    Access:  Right femoral temp HD catheter 8/29.     Basic Metabolic Panel: Recent Labs  Lab 12/12/22 0512 12/13/22 0428 12/14/22 0440 12/15/22 0521 12/16/22 0531 12/17/22 0637 12/18/22 0449  NA 138   < > 136 137 136 132* 136  K 3.9   < > 3.6 3.4* 3.4* 3.2* 3.5  CL 96*   < > 98 101 99 99 100  CO2 25   < > 27 29 27 27 27   GLUCOSE 90   < > 160* 159* 148* 127* 112*  BUN 67*   < > 43* 61* 75* 51* 72*  CREATININE 2.42*   < > 1.72* 2.40* 2.77* 1.83* 2.18*  CALCIUM 8.9   < > 8.2* 8.1* 8.5* 8.1* 8.7*  MG 1.9  --  1.5* 2.7*  --   --  1.9  PHOS 2.0*   < > 2.3* 3.9 4.1 2.7 3.0   < > = values in this interval not displayed.    Liver Function Tests: Recent Labs  Lab 12/14/22 0440 12/15/22 0521 12/16/22 0531 12/17/22 0637 12/18/22 0449   ALBUMIN 2.1* 1.9* 2.5* 2.4* 2.8*   No results for input(s): "LIPASE", "AMYLASE" in the last 168 hours. No results for input(s): "AMMONIA" in the last 168 hours.  CBC: Recent Labs  Lab 12/14/22 0440 12/15/22 0521 12/16/22 0531 12/17/22 0637 12/18/22 0449  WBC 40.2* 22.6* 18.2* 19.1* 19.6*  HGB 7.9* 7.4* 7.1* 9.2* 8.8*  HCT 25.3* 25.2* 24.0* 28.7* 28.1*  MCV 87.5 91.3 92.0 85.2 88.9  PLT 328 353 385 357 363    Cardiac Enzymes: No results for input(s): "CKTOTAL", "CKMB", "CKMBINDEX", "TROPONINI" in the last 168 hours.  BNP: Invalid input(s): "POCBNP"  CBG: Recent Labs  Lab 12/17/22 1603 12/17/22 2016 12/17/22 2347 12/18/22 0331 12/18/22 0726  GLUCAP 110* 119* 92 106* 107*    Microbiology: Results for orders placed or performed during the hospital encounter of 12/05/22  Culture, blood (Routine x 2)     Status: Abnormal   Collection Time: 12/05/22  8:56 PM   Specimen: BLOOD  Result Value Ref Range Status   Specimen Description   Final    BLOOD BLOOD RIGHT ARM Performed at [facility], [address] [place], [place] [address]    Special Requests   Final  BOTTLES DRAWN AEROBIC AND ANAEROBIC Blood Culture adequate volume Performed at [facility], [address] [place], [place] [address]    Culture  Setup Time   Final    GRAM POSITIVE COCCI IN BOTH AEROBIC AND ANAEROBIC BOTTLES CRITICAL RESULT CALLED TO, READ BACK BY AND VERIFIED WITH: SHEEMA HALLLAJI AT 1206 12/06/22.PMF Performed at [facility], [address] [place], [place] [address]    Culture (A)  Final    STAPHYLOCOCCUS AUREUS SUSCEPTIBILITIES PERFORMED ON PREVIOUS CULTURE WITHIN THE LAST 5 DAYS. Performed at [facility], [address] [place], [place] [address]    Report Status 12/08/2022 FINAL  Final  Culture, blood (Routine x 2)     Status: Abnormal   Collection Time: 12/05/22  8:57 PM   Specimen: BLOOD  Result Value Ref Range Status   Specimen  Description   Final    BLOOD BLOOD LEFT ARM Performed at [facility], [address], [place], [place] [address]    Special Requests   Final    BOTTLES DRAWN AEROBIC AND ANAEROBIC Blood Culture adequate volume Performed at [facility], [address] [place], [place] [address]    Culture  Setup Time   Final    GRAM POSITIVE COCCI IN BOTH AEROBIC AND ANAEROBIC BOTTLES CRITICAL RESULT CALLED TO, READ BACK BY AND VERIFIED WITH: SHEEMA HALLAJI AT 1206 12/06/22.PMF Performed at [facility], [address] [place], [place] [address]    Culture METHICILLIN RESISTANT STAPHYLOCOCCUS AUREUS (A)  Final   Report Status 12/08/2022 FINAL  Final   Organism ID, Bacteria METHICILLIN RESISTANT STAPHYLOCOCCUS AUREUS  Final      Susceptibility   Methicillin resistant staphylococcus aureus - MIC*    CIPROFLOXACIN >=8 RESISTANT Resistant     ERYTHROMYCIN >=8 RESISTANT Resistant     GENTAMICIN <=0.5 SENSITIVE Sensitive     OXACILLIN >=4 RESISTANT Resistant     TETRACYCLINE <=1 SENSITIVE Sensitive     VANCOMYCIN <=0.5 SENSITIVE Sensitive     TRIMETH/SULFA >=320 RESISTANT Resistant     CLINDAMYCIN <=0.25 SENSITIVE Sensitive     RIFAMPIN <=0.5 SENSITIVE Sensitive     Inducible Clindamycin NEGATIVE Sensitive     LINEZOLID 2 SENSITIVE Sensitive     * METHICILLIN RESISTANT STAPHYLOCOCCUS AUREUS  Blood Culture ID Panel (Reflexed)     Status: Abnormal   Collection Time: 12/05/22  8:57 PM  Result Value Ref Range Status   Enterococcus faecalis NOT DETECTED NOT DETECTED Final   Enterococcus Faecium NOT DETECTED NOT DETECTED Final   Listeria monocytogenes NOT DETECTED NOT DETECTED Final   Staphylococcus species DETECTED (A) NOT DETECTED Final    Comment: CRITICAL RESULT CALLED TO, READ BACK BY AND VERIFIED WITH: SHEEMA HALLAJI AT 1206 12/06/22.PMF    Staphylococcus aureus (BCID) DETECTED (A) NOT DETECTED Final    Comment: Methicillin (oxacillin)-resistant Staphylococcus aureus  (MRSA). MRSA is predictably resistant to beta-lactam antibiotics (except ceftaroline). Preferred therapy is vancomycin unless clinically contraindicated. Patient requires contact precautions if  hospitalized. CRITICAL RESULT CALLED TO, READ BACK BY AND VERIFIED WITH: SHEEMA HALLAJI AT 1206 12/06/22.PMF    Staphylococcus epidermidis DETECTED (A) NOT DETECTED Final    Comment: CRITICAL RESULT CALLED TO, READ BACK BY AND VERIFIED WITH: SHEEMA HALLAJI AT 1206 12/06/22.PMF    Staphylococcus lugdunensis NOT DETECTED NOT DETECTED Final   Streptococcus species NOT DETECTED NOT DETECTED Final   Streptococcus agalactiae NOT DETECTED NOT DETECTED Final   Streptococcus pneumoniae NOT DETECTED NOT DETECTED Final  Streptococcus pyogenes NOT DETECTED NOT DETECTED Final   A.calcoaceticus-baumannii NOT DETECTED NOT DETECTED Final   Bacteroides fragilis NOT DETECTED NOT DETECTED Final   Enterobacterales NOT DETECTED NOT DETECTED Final   Enterobacter cloacae complex NOT DETECTED NOT DETECTED Final   Escherichia coli NOT DETECTED NOT DETECTED Final   Klebsiella aerogenes NOT DETECTED NOT DETECTED Final   Klebsiella oxytoca NOT DETECTED NOT DETECTED Final   Klebsiella pneumoniae NOT DETECTED NOT DETECTED Final   Proteus species NOT DETECTED NOT DETECTED Final   Salmonella species NOT DETECTED NOT DETECTED Final   Serratia marcescens NOT DETECTED NOT DETECTED Final   Haemophilus influenzae NOT DETECTED NOT DETECTED Final   Neisseria meningitidis NOT DETECTED NOT DETECTED Final   Pseudomonas aeruginosa NOT DETECTED NOT DETECTED Final   Stenotrophomonas maltophilia NOT DETECTED NOT DETECTED Final   Candida albicans NOT DETECTED NOT DETECTED Final   Candida auris NOT DETECTED NOT DETECTED Final   Candida glabrata NOT DETECTED NOT DETECTED Final   Candida krusei NOT DETECTED NOT DETECTED Final   Candida parapsilosis NOT DETECTED NOT DETECTED Final   Candida tropicalis NOT DETECTED NOT DETECTED Final    Cryptococcus neoformans/gattii NOT DETECTED NOT DETECTED Final   Methicillin resistance mecA/C DETECTED (A) NOT DETECTED Final    Comment: CRITICAL RESULT CALLED TO, READ BACK BY AND VERIFIED WITH: SHEEMA HALLAJI AT 1206 12/06/22.PMF    Meth resistant mecA/C and MREJ DETECTED (A) NOT DETECTED Final    Comment: CRITICAL RESULT CALLED TO, READ BACK BY AND VERIFIED WITH: SHEEMA HALLAJI AT 1206 12/06/22.PMF Performed at [facility], [address] [place], [place] [address]   Resp panel by RT-PCR (RSV, Flu A&B, Covid) Anterior Nasal Swab     Status: None   Collection Time: 12/05/22  9:53 PM   Specimen: Anterior Nasal Swab  Result Value Ref Range Status   SARS Coronavirus 2 by RT PCR NEGATIVE NEGATIVE Final    Comment: (NOTE) SARS-CoV-2 target nucleic acids are NOT DETECTED.  The SARS-CoV-2 RNA is generally detectable in upper respiratory specimens during the acute phase of infection. The lowest concentration of SARS-CoV-2 viral copies this assay can detect is 138 copies/mL. A negative result does not preclude SARS-Cov-2 infection and should not be used as the sole basis for treatment or other patient management decisions. A negative result may occur with  improper specimen collection/handling, submission of specimen other than nasopharyngeal swab, presence of viral mutation(s) within the areas targeted by this assay, and inadequate number of viral copies(<138 copies/mL). A negative result must be combined with clinical observations, patient history, and epidemiological information. The expected result is Negative.  Fact Sheet for Patients:  BloggerCourse.com  Fact Sheet for Healthcare Providers:  SeriousBroker.it  This test is no t yet approved or cleared by the [place] FDA and  has been authorized for detection and/or diagnosis of SARS-CoV-2 by FDA under an Emergency Use Authorization (EUA). This EUA will remain  in  effect (meaning this test can be used) for the duration of the COVID-19 declaration under Section 564(b)(1) of the Act, 21 U.S.C.section 360bbb-3(b)(1), unless the authorization is terminated  or revoked sooner.       Influenza A by PCR NEGATIVE NEGATIVE Final   Influenza B by PCR NEGATIVE NEGATIVE Final    Comment: (NOTE) The Xpert Xpress SARS-CoV-2/FLU/RSV plus assay is intended as an aid in the diagnosis of influenza from Nasopharyngeal swab specimens and should not be used as a sole basis for treatment. Nasal washings and aspirates are  unacceptable for Xpert Xpress SARS-CoV-2/FLU/RSV testing.  Fact Sheet for Patients: BloggerCourse.com  Fact Sheet for Healthcare Providers: SeriousBroker.it  This test is not yet approved or cleared by the [place] FDA and has been authorized for detection and/or diagnosis of SARS-CoV-2 by FDA under an Emergency Use Authorization (EUA). This EUA will remain in effect (meaning this test can be used) for the duration of the COVID-19 declaration under Section 564(b)(1) of the Act, 21 U.S.C. section 360bbb-3(b)(1), unless the authorization is terminated or revoked.     Resp Syncytial Virus by PCR NEGATIVE NEGATIVE Final    Comment: (NOTE) Fact Sheet for Patients: BloggerCourse.com  Fact Sheet for Healthcare Providers: SeriousBroker.it  This test is not yet approved or cleared by the [place] FDA and has been authorized for detection and/or diagnosis of SARS-CoV-2 by FDA under an Emergency Use Authorization (EUA). This EUA will remain in effect (meaning this test can be used) for the duration of the COVID-19 declaration under Section 564(b)(1) of the Act, 21 U.S.C. section 360bbb-3(b)(1), unless the authorization is terminated or revoked.  Performed at [facility], [address] [place], [place] [address]   MRSA Next  Gen by PCR, Nasal     Status: Abnormal   Collection Time: 12/06/22  4:27 AM   Specimen: Nasal Mucosa; Nasal Swab  Result Value Ref Range Status   MRSA by PCR Next Gen DETECTED (A) NOT DETECTED Final    Comment: RESULT CALLED TO, READ BACK BY AND VERIFIED WITH: KRISTINE CHAMBERS AT 2130 12/06/22.PMF (NOTE) The GeneXpert MRSA Assay (FDA approved for NASAL specimens only), is one component of a comprehensive MRSA colonization surveillance program. It is not intended to diagnose MRSA infection nor to guide or monitor treatment for MRSA infections. Test performance is not FDA approved in patients less than 58 years old. Performed at [facility], [address] [place], [place] [address]   Cath Tip Culture     Status: Abnormal   Collection Time: 12/06/22  2:22 PM   Specimen: Catheter Tip; Other  Result Value Ref Range Status   Specimen Description   Final    CATH TIP Performed at [facility], [address] [place], [place] [address]    Special Requests   Final    NONE Performed at [facility], [address] [place], [place] [address]    Culture (A)  Final    >=100,000 COLONIES/mL METHICILLIN RESISTANT STAPHYLOCOCCUS AUREUS   Report Status 12/08/2022 FINAL  Final   Organism ID, Bacteria METHICILLIN RESISTANT STAPHYLOCOCCUS AUREUS (A)  Final      Susceptibility   Methicillin resistant staphylococcus aureus - MIC*    CIPROFLOXACIN >=8 RESISTANT Resistant     ERYTHROMYCIN >=8 RESISTANT Resistant     GENTAMICIN <=0.5 SENSITIVE Sensitive     OXACILLIN >=4 RESISTANT Resistant     TETRACYCLINE <=1 SENSITIVE Sensitive     VANCOMYCIN <=0.5 SENSITIVE Sensitive     TRIMETH/SULFA >=320 RESISTANT Resistant     CLINDAMYCIN <=0.25 SENSITIVE Sensitive     RIFAMPIN <=0.5 SENSITIVE Sensitive     Inducible Clindamycin NEGATIVE Sensitive     LINEZOLID 2 SENSITIVE Sensitive     * >=100,000 COLONIES/mL METHICILLIN RESISTANT STAPHYLOCOCCUS AUREUS  Urine Culture  (for pregnant, neutropenic or urologic patients or patients with an indwelling urinary catheter)     Status: Abnormal   Collection Time: 12/06/22  5:41 PM   Specimen: Urine, Clean Catch  Result Value Ref Range Status   Specimen Description  Final    URINE, CLEAN CATCH Performed at [facility], [address] [place], [place] [address]    Special Requests   Final    Normal Performed at [facility], [address] [place], [place] [address]    Culture (A)  Final    70,000 COLONIES/mL ENTEROCOCCUS FAECALIS 70,000 COLONIES/mL YEAST    Report Status 12/09/2022 FINAL  Final   Organism ID, Bacteria ENTEROCOCCUS FAECALIS (A)  Final      Susceptibility   Enterococcus faecalis - MIC*    AMPICILLIN <=2 SENSITIVE Sensitive     NITROFURANTOIN <=16 SENSITIVE Sensitive     VANCOMYCIN 1 SENSITIVE Sensitive     * 70,000 COLONIES/mL ENTEROCOCCUS FAECALIS  Culture, blood (Routine X 2) w Reflex to ID Panel     Status: None   Collection Time: 12/07/22  1:31 PM   Specimen: BLOOD  Result Value Ref Range Status   Specimen Description BLOOD BLOOD RIGHT HAND  Final   Special Requests   Final    BOTTLES DRAWN AEROBIC ONLY Blood Culture adequate volume   Culture   Final    NO GROWTH 5 DAYS Performed at [facility], [address] [place], [place] [address]    Report Status 12/12/2022 FINAL  Final  Culture, blood (Routine X 2) w Reflex to ID Panel     Status: None   Collection Time: 12/07/22  6:41 PM   Specimen: BLOOD  Result Value Ref Range Status   Specimen Description BLOOD BLOOD RIGHT HAND  Final   Special Requests   Final    BOTTLES DRAWN AEROBIC AND ANAEROBIC Blood Culture results may not be optimal due to an inadequate volume of blood received in culture bottles   Culture   Final    NO GROWTH 5 DAYS Performed at [facility], [address] [place], [place] [address]    Report Status 12/12/2022 FINAL  Final  C Difficile Quick Screen (NO PCR  Reflex)     Status: None   Collection Time: 12/12/22  4:07 PM   Specimen: STOOL  Result Value Ref Range Status   C Diff antigen NEGATIVE NEGATIVE Final   C Diff toxin NEGATIVE NEGATIVE Final   C Diff interpretation No C. difficile detected.  Final    Comment: Performed at [facility], [address] [place], [place] [address]  Gastrointestinal Panel by PCR , Stool     Status: None   Collection Time: 12/13/22  8:38 AM   Specimen: Stool  Result Value Ref Range Status   Campylobacter species NOT DETECTED NOT DETECTED Final   Plesimonas shigelloides NOT DETECTED NOT DETECTED Final   Salmonella species NOT DETECTED NOT DETECTED Final   Yersinia enterocolitica NOT DETECTED NOT DETECTED Final   Vibrio species NOT DETECTED NOT DETECTED Final   Vibrio cholerae NOT DETECTED NOT DETECTED Final   Enteroaggregative E coli (EAEC) NOT DETECTED NOT DETECTED Final   Enteropathogenic E coli (EPEC) NOT DETECTED NOT DETECTED Final   Enterotoxigenic E coli (ETEC) NOT DETECTED NOT DETECTED Final   Shiga like toxin producing E coli (STEC) NOT DETECTED NOT DETECTED Final   Shigella/Enteroinvasive E coli (EIEC) NOT DETECTED NOT DETECTED Final   Cryptosporidium NOT DETECTED NOT DETECTED Final   Cyclospora cayetanensis NOT DETECTED NOT DETECTED Final   Entamoeba histolytica NOT DETECTED NOT DETECTED Final   Giardia lamblia NOT DETECTED NOT DETECTED Final   Adenovirus F40/41 NOT DETECTED NOT DETECTED Final   Astrovirus NOT DETECTED  NOT DETECTED Final   Norovirus GI/GII NOT DETECTED NOT DETECTED Final   Rotavirus A NOT DETECTED NOT DETECTED Final   Sapovirus (I, II, IV, and V) NOT DETECTED NOT DETECTED Final    Comment: Performed at [facility], [address] [place], [place] [address]    Coagulation Studies: No results for input(s): "LABPROT", "INR" in the last 72 hours.   Urinalysis: No results for input(s): "COLORURINE", "LABSPEC", "PHURINE", "GLUCOSEU", "HGBUR", "BILIRUBINUR",  "KETONESUR", "PROTEINUR", "UROBILINOGEN", "NITRITE", "LEUKOCYTESUR" in the last 72 hours.  Invalid input(s): "APPERANCEUR"     Imaging: US Venous Img Lower Bilateral (DVT)  Result Date: 12/17/2022 CLINICAL DATA:  Bilateral lower extremity pain and edema. History of IVC filter placement. Evaluate for DVT. History of left above knee amputation. EXAM: BILATERAL LOWER EXTREMITY VENOUS DOPPLER ULTRASOUND TECHNIQUE: Gray-scale sonography with graded compression, as well as color Doppler and duplex ultrasound were performed to evaluate the lower extremity deep venous systems from the level of the common femoral vein and including the common femoral, femoral, profunda femoral, popliteal and calf veins including the posterior tibial, peroneal and gastrocnemius veins when visible. The superficial great saphenous vein was also interrogated. Spectral Doppler was utilized to evaluate flow at rest and with distal augmentation maneuvers in the common femoral, femoral and popliteal veins. COMPARISON:  Left lower extremity venous Doppler ultrasound-05/04/2021 FINDINGS: RIGHT LOWER EXTREMITY Common Femoral Vein: No evidence of thrombus. Normal compressibility, respiratory phasicity and response to augmentation. Saphenofemoral Junction: No evidence of thrombus. Normal compressibility and flow on color Doppler imaging. Profunda Femoral Vein: No evidence of thrombus. Normal compressibility and flow on color Doppler imaging. Femoral Vein: No evidence of thrombus. Normal compressibility, respiratory phasicity and response to augmentation. Popliteal Vein: No evidence of thrombus. Normal compressibility, respiratory phasicity and response to augmentation. Calf Veins: Appear patent where visualized. Superficial Great Saphenous Vein: No evidence of thrombus. Normal compressibility. Other Findings:  None. LEFT LOWER EXTREMITY Common Femoral Vein: There is hypoechoic nonocclusive wall thickening/chronic DVT within the left common  femoral vein (image 30 and 40)). Saphenofemoral Junction: There is nonocclusive wall thickening/chronic DVT involving the saphenofemoral junction (image 34). Profunda Femoral Vein: There is nonocclusive wall thickening/chronic DVT involving the left deep femoral vein (image 39). Femoral Vein: No evidence of acute or chronic thrombus. Normal compressibility, respiratory phasicity and response to augmentation. Popliteal Vein: N/A Calf Veins: N/A Superficial Great Saphenous Vein: No evidence of thrombus. Normal compressibility. Other Findings:  None. IMPRESSION: 1. No evidence of acute or chronic DVT within the right lower extremity. 2. 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 Electronically Signed   By: Simonne Come M.D.   On: 12/17/2022 13:34     Medications:    sodium chloride Stopped (12/15/22 0035)   sodium chloride Stopped (12/08/22 1823)   sodium chloride     anticoagulant sodium citrate     norepinephrine (LEVOPHED) Adult infusion Stopped (12/17/22 1000)   vancomycin Stopped (12/16/22 1456)    sodium chloride   Intravenous Once   amiodarone  200 mg Per Tube BID   ascorbic acid  500 mg Per Tube BID   atorvastatin  40 mg Per Tube Daily   chlorhexidine  15 mL Mouth Rinse BID   Chlorhexidine Gluconate Cloth  6 each Topical Q0600   vitamin D3  2,000 Units Per Tube Daily  copper  2 mg Per Tube Daily   droxidopa  300 mg Per Tube TID WC   epoetin (EPOGEN/PROCRIT) injection  10,000 Units Intravenous Q T,Th,Sa-HD   escitalopram  10 mg Per Tube Daily   famotidine  10 mg Per Tube Daily   feeding supplement (NEPRO CARB STEADY)  1,000 mL Per Tube Q24H   feeding supplement (PROSource TF20)  60 mL Per Tube Daily   fludrocortisone  0.2 mg Per Tube Daily   free water  30 mL Per Tube Q4H    heparin injection (subcutaneous)  5,000 Units Subcutaneous Q8H   hydrOXYzine  50 mg Per Tube Q6H   insulin aspart  0-6 Units Subcutaneous Q4H   midodrine  20 mg Per Tube TID WC   multivitamin  1 tablet Per Tube QHS   mupirocin ointment   Nasal BID   nutrition supplement (JUVEN)  1 packet Per Tube BID BM   mouth rinse  15 mL Mouth Rinse 4 times per day   phenylephrine  10 mg Per J Tube BID   sodium chloride, acetaminophen **OR** acetaminophen, alteplase, alteplase, anticoagulant sodium citrate, artificial tears, clonazePAM, fentaNYL (SUBLIMAZE) injection, heparin, heparin, ipratropium-albuterol, lidocaine (PF), lidocaine-prilocaine, loperamide HCl, magnesium hydroxide, ondansetron **OR** ondansetron (ZOFRAN) IV, mouth rinse, pentafluoroprop-tetrafluoroeth, sennosides, traZODone  Assessment/ Plan:  [name] is a 72 y.o.  female with past medical history of diabetes, COPD, GERD, hypertension. OA, a fib, and end stage renal disease on hemodialysis. Patient presents to the ED from her nursing facility complaining of altered mental status. She has been admitted for Lower urinary tract infectious disease [N39.0] Altered mental status, unspecified altered mental status type [R41.82] Sepsis due to gram-negative UTI (HCC) [A41.50, N39.0] Sepsis, due to unspecified organism, unspecified whether acute organ dysfunction present (HCC) [A41.9]  [facility]/TTS/Rt Permcath/? 81.3kg  End stage renal disease -Hemodialysis via femoral temp cath TTS schedule. - Dialysis postponed today and will be performed on Friday.  -Vascular surgery consulted for permcath placement.  -Will manage fluid removal with dialysis as patient tolerates.  -Significant hypoalbuminemia. Albumin ordered by primary team.  Supplementation during dialysis for hemodynamic stability.  2. Hypotension due to sepsis. Blood cultures and dialysis cath tip cultures with MRSA. Repeat cultures with no growth. However now  with significant leukocytosis.   - Pressors held since 3am.   - continue antibiotics: vancomycin.  Pharmacy assisting with dosing. ID following.    3. Anemia of chronic kidney disease : PRBC transfusion on 8/26, 9/3  - EPO with HD treatments  - Hgb 8.8   4. Secondary Hyperparathyroidism:  with hypophosphatemia.   - Calcium and phosphorus within acceptable range.   5.  Hypokalemia  -Potassium 3.5      LOS: 12 Renea Schoonmaker 9/5/202411:12 AM

## 2022-12-18 NOTE — Progress Notes (Signed)
PHARMACY CONSULT NOTE  Pharmacy Consult for Electrolyte Monitoring and Replacement   Recent Labs: Potassium (mmol/L)  Date Value  12/18/2022 3.5  08/11/2013 3.3 (L)   Magnesium (mg/dL)  Date Value  91/47/8295 1.9   Calcium (mg/dL)  Date Value  62/13/0865 8.7 (L)   Calcium, Total (mg/dL)  Date Value  78/46/9629 9.2   Albumin (g/dL)  Date Value  52/84/1324 2.8 (L)  08/11/2013 3.1 (L)   Phosphorus (mg/dL)  Date Value  40/01/2724 3.0   Sodium (mmol/L)  Date Value  12/18/2022 136  08/11/2013 138    Assessment: 72 y.o. female with medical history significant for asthma, COPD, type diabetes mellitus, s/p G-tube, GERD, ESRD on HD, hypertension, osteoarthritis, paroxysmal atrial fibrillation and OSA on CPAP, who presented to the emergency room with acute onset of altered mental status with lethargy and decreased responsiveness, started on CRRT.  CRRT stopped 8/25. line holiday. CVC placed 8/29 and HD performed same day. Patient is on tube feeds.  Last HD 12/16/2022  Nutrition: Nepro Carb Steady @ 65 mL/hr + 30 mL of free water q4h  Pharmacy has been consulted to monitor and replace electrolytes.  Goal of Therapy:  Electrolytes WNL  Plan:  No electrolyte supplementation needed today Recheck electrolytes tomorrow morning  Darolyn Rua, PharmD Student [employer]

## 2022-12-18 NOTE — Progress Notes (Signed)
[facility]    Date of Admission:  12/05/2022      ID: Alexandra Foster is a 72 y.o. female with  MRSA bacteremia thought to be due to HD catheter Principal Problem:   Septic shock (HCC) Active Problems:   MRSA bacteremia   Acute metabolic encephalopathy   Paroxysmal atrial fibrillation (HCC)   Anxiety and depression   Type 2 diabetes mellitus with chronic kidney disease, with long-term current use of insulin (HCC)   ESRD on hemodialysis (HCC)    Subjective: Afebrile, but still with pruritic rash, "itch my back please"  Medications:   sodium chloride   Intravenous Once   amiodarone  200 mg Per Tube BID   ascorbic acid  500 mg Per Tube BID   atorvastatin  40 mg Per Tube Daily   chlorhexidine  15 mL Mouth Rinse BID   Chlorhexidine Gluconate Cloth  6 each Topical Q0600   vitamin D3  2,000 Units Per Tube Daily   copper  2 mg Per Tube Daily   droxidopa  300 mg Per Tube TID WC   epoetin (EPOGEN/PROCRIT) injection  10,000 Units Intravenous Q T,Th,Sa-HD   escitalopram  10 mg Per Tube Daily   famotidine  10 mg Per Tube Daily   feeding supplement (NEPRO CARB STEADY)  1,000 mL Per Tube Q24H   feeding supplement (PROSource TF20)  60 mL Per Tube Daily   fludrocortisone  0.2 mg Per Tube Daily   free water  30 mL Per Tube Q4H   heparin injection (subcutaneous)  5,000 Units Subcutaneous Q8H   hydrOXYzine  50 mg Per Tube Q6H   insulin aspart  0-6 Units Subcutaneous Q4H   midodrine  20 mg Per Tube TID WC   multivitamin  1 tablet Per Tube QHS   mupirocin ointment   Nasal BID   nutrition supplement (JUVEN)  1 packet Per Tube BID BM   mouth rinse  15 mL Mouth Rinse 4 times per day   phenylephrine  10 mg Per J Tube BID    Objective: Vital signs in last 24 hours: Temp:  [97.9 F (36.6 C)-99.6 F (37.6 C)] 97.9 F (36.6 C) (09/05 1559) Pulse Rate:  [57-83] 57 (09/05 1900) Resp:  [13-25] 15 (09/05 1900) BP: (81-115)/(37-96) 89/45 (09/05 1900) SpO2:   [93 %-100 %] 97 % (09/05 1900) Weight:  [91.2 kg] 91.2 kg (09/05 0500)  Physical Exam  Constitutional:  oriented to person, place, and time. appears well-developed and well-nourished. No distress.  HENT: [place]/AT, PERRLA, no scleral icterus Mouth/Throat: Oropharynx is clear and moist. No oropharyngeal exudate.  Cardiovascular: Normal rate, regular rhythm and normal heart sounds. Exam reveals no gallop and no friction rub.  No murmur heard.  Pulmonary/Chest: Effort normal and breath sounds normal. No respiratory distress.  has no wheezes.  Neck = supple, no nuchal rigidity Ext= femoral temp cath Abdominal: Soft. Bowel sounds are normal.  exhibits no distension. There is no tenderness.  Lymphadenopathy: no cervical adenopathy. No axillary adenopathy Neurological: alert and oriented to person, place, and time.  Skin: Skin is warm and dry. No rash noted. No erythema. Dry skin to check, neck, hands and torso Psychiatric: a normal mood and affect.  behavior is normal.    Lab Results Recent Labs    12/17/22 0637 12/18/22 0449  WBC 19.1* 19.6*  HGB 9.2* 8.8*  HCT 28.7* 28.1*  NA 132* 136  K 3.2* 3.5  CL 99 100  CO2 27  27  BUN 51* 72*  CREATININE 1.83* 2.18*   Liver Panel Recent Labs    12/17/22 0637 12/18/22 0449  ALBUMIN 2.4* 2.8*   Sedimentation Rate No results for input(s): "ESRSEDRATE" in the last 72 hours. C-Reactive Protein No results for input(s): "CRP" in the last 72 hours.  Microbiology: 8/25 blood cx ngtd 8/25 cath tip MRSA   Methicillin resistant staphylococcus aureus    MIC    CIPROFLOXACIN >=8 RESISTANT Resistant    CLINDAMYCIN <=0.25 SENS... Sensitive    ERYTHROMYCIN >=8 RESISTANT Resistant    GENTAMICIN <=0.5 SENSI... Sensitive    Inducible Clindamycin NEGATIVE Sensitive    LINEZOLID 2 SENSITIVE Sensitive    OXACILLIN >=4 RESISTANT Resistant    RIFAMPIN <=0.5 SENSI... Sensitive    TETRACYCLINE <=1 SENSITIVE Sensitive    TRIMETH/SULFA >=320 RESIS...  Resistant    VANCOMYCIN <=0.5 SENSI... Sensitive    Studies/Results: US Venous Img Lower Bilateral (DVT)  Result Date: 12/17/2022 CLINICAL DATA:  Bilateral lower extremity pain and edema. History of IVC filter placement. Evaluate for DVT. History of left above knee amputation. EXAM: BILATERAL LOWER EXTREMITY VENOUS DOPPLER ULTRASOUND TECHNIQUE: Gray-scale sonography with graded compression, as well as color Doppler and duplex ultrasound were performed to evaluate the lower extremity deep venous systems from the level of the common femoral vein and including the common femoral, femoral, profunda femoral, popliteal and calf veins including the posterior tibial, peroneal and gastrocnemius veins when visible. The superficial great saphenous vein was also interrogated. Spectral Doppler was utilized to evaluate flow at rest and with distal augmentation maneuvers in the common femoral, femoral and popliteal veins. COMPARISON:  Left lower extremity venous Doppler ultrasound-05/04/2021 FINDINGS: RIGHT LOWER EXTREMITY Common Femoral Vein: No evidence of thrombus. Normal compressibility, respiratory phasicity and response to augmentation. Saphenofemoral Junction: No evidence of thrombus. Normal compressibility and flow on color Doppler imaging. Profunda Femoral Vein: No evidence of thrombus. Normal compressibility and flow on color Doppler imaging. Femoral Vein: No evidence of thrombus. Normal compressibility, respiratory phasicity and response to augmentation. Popliteal Vein: No evidence of thrombus. Normal compressibility, respiratory phasicity and response to augmentation. Calf Veins: Appear patent where visualized. Superficial Great Saphenous Vein: No evidence of thrombus. Normal compressibility. Other Findings:  None. 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 Saphenofemoral Junction: There is  nonocclusive wall thickening/chronic DVT involving the saphenofemoral junction (image 34). Profunda Femoral Vein: There is nonocclusive wall thickening/chronic DVT involving the left deep femoral vein (image 39). Femoral Vein: No evidence of acute or chronic thrombus. Normal compressibility, respiratory phasicity and response to augmentation. Popliteal Vein: N/A Calf Veins: N/A Superficial Great Saphenous Vein: No evidence of thrombus. Normal compressibility. Other Findings:  None. IMPRESSION: 1. No evidence of acute or chronic DVT within the right lower extremity. 2. 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 Electronically Signed   By: Simonne Come M.D.   On: 12/17/2022 13:34     Assessment/Plan: MRSA bacteremia = patient declined TEE, plan to treat as complicated bacteremia with 4 wk of treatment using 8/25 as day 1  Suspected abtx rash = not complete clear if it unasyn or vancomycin causing persistent rash. We will discontinue vancomycin and transition to give daptomycin with HD to finishing out the remaining course  ESRD on Hd = agree with plan to remove femoral line and change back to temp cath by vascular team.  Will sign off.  [facility] Pager: [phone]  12/18/2022, 8:24 PM

## 2022-12-18 NOTE — Plan of Care (Signed)
  Problem: Fluid Volume: Goal: Hemodynamic stability will improve Outcome: Progressing   Problem: Clinical Measurements: Goal: Diagnostic test results will improve Outcome: Progressing Goal: Signs and symptoms of infection will decrease Outcome: Progressing   Problem: Respiratory: Goal: Ability to maintain adequate ventilation will improve Outcome: Progressing   Problem: Education: Goal: Knowledge of General Education information will improve Description Including pain rating scale, medication(s)/side effects and non-pharmacologic comfort measures Outcome: Progressing   Problem: Clinical Measurements: Goal: Ability to maintain clinical measurements within normal limits will improve Outcome: Progressing Goal: Will remain free from infection Outcome: Progressing Goal: Diagnostic test results will improve Outcome: Progressing Goal: Respiratory complications will improve Outcome: Progressing Goal: Cardiovascular complication will be avoided Outcome: Progressing

## 2022-12-18 NOTE — TOC Progression Note (Signed)
Transition of Care [facility]) - Progression Note    Patient Details  Name: Alexandra Foster MRN: [id number] Date of Birth: Jan 23, 1951  [facility] CM/SW Contact  Kreg Shropshire, RN Phone Number: 12/18/2022, 12:20 PM  Clinical Narrative:    CM spoke with pt daughter. Family wants to take pt home for 24/7 care. Daughter stated pt spouse and sister will also help with 24/7 care. Cm asked if family needs resources for home health aids. Daughter wanted information regarding medicaid program for family getting paid to care for their loved ones. Daughter stated that they do not have any equipment at home. Family does have a ramp for pt to get into the house. Cm asked if they would be able to transport pt to and from dialysis if needed. Daughter stated yes.   According to therapy notes pt needs the following equipment at home. Cm will order closer to d/c date per insurance requirements.   [facility] Bed Wheelchair  Pt also recommended [facility]. Family does not have a preference on a company. Cm will make referral closer to d/c date   Expected Discharge Plan: Home w Home Health Services Barriers to Discharge: Continued Medical Work up  Expected Discharge Plan and Services     Post Acute Care Choice: Home Health Living arrangements for the past 2 months: Skilled Nursing Facility                                       Social Determinants of Health (SDOH) Interventions SDOH Screenings   Food Insecurity: Patient Unable To Answer (12/06/2022)  Housing: High Risk (12/06/2022)  Transportation Needs: No Transportation Needs (12/06/2022)  Utilities: Not At Risk (12/06/2022)  Alcohol Screen: Low Risk  (02/11/2021)  Depression (PHQ2-9): Low Risk  (04/30/2021)  Financial Resource Strain: Medium Risk (08/09/2020)  Physical Activity: Inactive (08/09/2020)  Social Connections: Moderately Integrated (08/09/2020)  Stress: No Stress Concern Present (08/09/2020)  Tobacco Use: Medium  Risk (12/05/2022)    Readmission Risk Interventions     No data to display

## 2022-12-18 NOTE — Consult Note (Signed)
Hospital Consult    Reason for Consult:  [facility] Catheter Placement for Hemodialysis Requesting Physician:  Zada Girt NP  MRN #:  [id number]  History of Present Illness: This is a 72 y.o. 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  Patient's prior permacath was removed on 12/06/22 due to blood cultures that were questionably positive for MRSA bacteremia at the time of admission.  Cultures were cleared on 12/07/2022.  Patient then had temporary dialysis catheter placed.  At this time vascular surgery was consulted for placement of another permacath as the patient's condition has improved over the past week.  [name] is at the bedside as the patient's neurostatus remains slightly confused.  I discussed with the daughter the placement of a dialysis permacatheter for the use of hemodialysis.  Daughter agrees with the plan and wants to proceed with the placement of the catheter tomorrow to improve her hemodialysis.  His daughter today at the bedside signed consent for this procedure.  Past Medical History:  Diagnosis Date   AKI (acute kidney injury) (HCC)    a. 04/2021 in setting of bacteremia/shock.   Arthritis    Asthma    Bacteremia    a. 04/2021 S pyogenes bacteremia in setting of lower ext cellulitis.   COPD (chronic obstructive pulmonary disease) (HCC)    Diabetes mellitus without complication (HCC)    Endometriosis    GERD (gastroesophageal reflux disease)    History of echocardiogram    a. 07/2013 Echo: EF 55-60%, impaired relaxation, mild TR; b. 04/2021 Echo: EF 50-55%, mild LVH, nl RV fxn, mild BAE, Ao sclerosis w/o stenosis.   Hypertension    Obesity    PAF (paroxysmal atrial fibrillation) (HCC)    a. 04/2021 in setting of septic shock/cellulitis.   Sleep apnea    CPAP    Past Surgical History:  Procedure Laterality Date   ABDOMINAL HYSTERECTOMY      APPLICATION OF WOUND VAC Left 05/17/2021   Procedure: APPLICATION OF WOUND VAC/WOUND VAC EXCHANGE-Matrix Myriad;  Surgeon: Carolan Shiver, MD;  Location: [facility];  Service: General;  Laterality: Left;   APPLICATION OF WOUND VAC  05/24/2021   Procedure: APPLICATION OF WOUND VAC;  Surgeon: Carolan Shiver, MD;  Location: [facility];  Service: General;;   APPLICATION OF WOUND VAC  05/10/2021   Procedure: APPLICATION OF WOUND VAC;  Surgeon: Carolan Shiver, MD;  Location: [facility];  Service: General;;   COLONOSCOPY  10/29/2006   Dr Servando Snare   COLONOSCOPY WITH PROPOFOL N/A 11/05/2016   Procedure: COLONOSCOPY WITH PROPOFOL;  Surgeon: Earline Mayotte, MD;  Location: [facility];  Service: Endoscopy;  Laterality: N/A;   INCISION AND DRAINAGE OF WOUND Left 05/24/2021   Procedure: IRRIGATION AND DEBRIDEMENT LEFT LEG;  Surgeon: Carolan Shiver, MD;  Location: [facility];  Service: General;  Laterality: Left;   INCISION AND DRAINAGE OF WOUND Left 05/10/2021   Procedure: IRRIGATION AND DEBRIDEMENT LEFT LEG;  Surgeon: Carolan Shiver, MD;  Location: [facility];  Service: General;  Laterality: Left;   IR FLUORO GUIDE CV LINE RIGHT  06/12/2021   IR PERC TUN PERIT CATH WO PORT S&I /IMAG  06/12/2021   IR REPLC GASTRO/COLONIC TUBE PERCUT W/FLUORO  06/12/2021   IVC FILTER INSERTION N/A 05/29/2021   Procedure: IVC FILTER INSERTION;  Surgeon: Renford Dills, MD;  Location: [facility];  Service: Cardiovascular;  Laterality: N/A;   MINOR GRAFT  APPLICATION  05/24/2021   Procedure: Myriad Matrix  APPLICATION;  Surgeon: Carolan Shiver, MD;  Location: [facility];  Service: General;;   NASAL SINUS SURGERY  2002   Dr Chestine Spore   PEG PLACEMENT N/A 05/28/2021   Procedure: PERCUTANEOUS ENDOSCOPIC GASTROSTOMY (PEG) PLACEMENT;  Surgeon: Sung Amabile, DO;  Location: [facility];  Service: General;  Laterality: N/A;  TRAVEL CASE   TRACHEOSTOMY TUBE PLACEMENT N/A 05/17/2021   Procedure: TRACHEOSTOMY;   Surgeon: Geanie Logan, MD;  Location: [facility];  Service: ENT;  Laterality: N/A;   WOUND DEBRIDEMENT Left 05/07/2021   Procedure: DEBRIDEMENT WOUND;  Surgeon: Carolan Shiver, MD;  Location: [facility];  Service: General;  Laterality: Left;    No Known Allergies  Prior to Admission medications   Medication Sig Start Date End Date Taking? Authorizing Provider  amiodarone (PACERONE) 200 MG tablet Place 1 tablet (200 mg total) into feeding tube 2 (two) times daily. 06/13/21  Yes Erin Fulling, MD  ascorbic acid (VITAMIN C) 500 MG tablet Place 1 tablet (500 mg total) into feeding tube 2 (two) times daily. 06/13/21  Yes Kasa, Wallis Bamberg, MD  busPIRone (BUSPAR) 5 MG tablet Take 5 mg by mouth 2 (two) times daily. 12/04/22  Yes [provider]  cholecalciferol (VITAMIN D) 25 MCG tablet Place 2 tablets (2,000 Units total) into feeding tube daily. 06/14/21  Yes Kasa, Wallis Bamberg, MD  clonazePAM (KLONOPIN) 0.5 MG tablet Take 0.25-0.5 mg by mouth 2 (two) times daily. 0.25 mg every morning and 0.5 mg at bedtime for Agitation 11/18/22  Yes [provider]  escitalopram (LEXAPRO) 10 MG tablet Place 1 tablet (10 mg total) into feeding tube daily. 06/14/21  Yes Erin Fulling, MD  hydrOXYzine (ATARAX) 50 MG tablet Take 100 mg by mouth 2 (two) times daily. 12/02/22  Yes [provider]  lip balm (BLISTEX) OINT Apply 1 application topically 3 (three) times daily. 06/13/21  Yes Erin Fulling, MD  loperamide (IMODIUM) 2 MG capsule Take 1 capsule (2 mg total) by mouth as needed for diarrhea or loose stools. 06/13/21  Yes Erin Fulling, MD  midodrine (PROAMATINE) 5 MG tablet Place 3 tablets (15 mg total) into feeding tube 3 (three) times daily with meals. 06/13/21  Yes Erin Fulling, MD  multivitamin (RENA-VIT) TABS tablet Place 1 tablet into feeding tube at bedtime. 06/13/21  Yes Erin Fulling, MD  omeprazole (PRILOSEC) 20 MG capsule Take 20 mg by mouth daily. 11/12/22  Yes [provider]  sennosides (SENOKOT)  8.8 MG/5ML syrup Place 10 mLs into feeding tube at bedtime as needed for moderate constipation. 06/13/21  Yes Erin Fulling, MD  artificial tears (LACRILUBE) OINT ophthalmic ointment Place into both eyes every 4 (four) hours as needed for dry eyes. Patient not taking: Reported on 12/06/2022 06/13/21   Erin Fulling, MD  chlorhexidine gluconate, MEDLINE KIT, (PERIDEX) 0.12 % solution 15 mLs by Mouth Rinse route 2 (two) times daily. Patient not taking: Reported on 12/06/2022 06/13/21   Erin Fulling, MD  collagenase (SANTYL) ointment Apply topically daily. Patient not taking: Reported on 12/06/2022 06/14/21   Erin Fulling, MD  dextrose 5 % SOLN 100 mL with copper chloride 0.4 MG/ML SOLN 2 mg Inject 2 mg into the vein daily. 06/14/21   Erin Fulling, MD  ipratropium-albuterol (DUONEB) 0.5-2.5 (3) MG/3ML SOLN Take 3 mLs by nebulization 3 (three) times daily. Patient not taking: Reported on 12/06/2022 06/13/21   Erin Fulling, MD  lactobacillus (FLORANEX/LACTINEX) PACK Place 1 packet (1 g total) into feeding tube 3 (three)  times daily with meals. Patient not taking: Reported on 12/06/2022 06/13/21   Erin Fulling, MD  pantoprazole sodium (PROTONIX) 40 mg Place 40 mg into feeding tube daily. Patient not taking: Reported on 12/06/2022 06/13/21   Erin Fulling, MD    Social History   Socioeconomic History   Marital status: Married    Spouse name: [name]   Number of children: 1   Years of education: 12   Highest education level: 12th grade  Occupational History    Employer: RETIRED  Tobacco Use   Smoking status: Former    Current packs/day: 0.00    Average packs/day: 2.0 packs/day for 40.0 years (80.0 ttl pk-yrs)    Types: Cigarettes    Start date: 1963    Quit date: 2003    Years since quitting: 21.6   Smokeless tobacco: Former    Types: Snuff    Quit date: 04/2001   Tobacco comments:    smoking cessation materials not required  Vaping Use   Vaping status: Never Used  Substance and Sexual Activity   Alcohol  use: No    Alcohol/week: 0.0 standard drinks of alcohol   Drug use: No   Sexual activity: Not Currently  Other Topics Concern   Not on file  Social History Narrative   Lives locally w/ husband.  Does not routinely exercise.   Social Determinants of Health   Financial Resource Strain: Medium Risk (08/09/2020)   Overall Financial Resource Strain (CARDIA)    Difficulty of Paying Living Expenses: Somewhat hard  Food Insecurity: Patient Unable To Answer (12/06/2022)   Hunger Vital Sign    Worried About Running Out of Food in the Last Year: Patient unable to answer    Ran Out of Food in the Last Year: Patient unable to answer  Transportation Needs: No Transportation Needs (12/06/2022)   PRAPARE - Administrator, Civil Service (Medical): No    Lack of Transportation (Non-Medical): No  Physical Activity: Inactive (08/09/2020)   Exercise Vital Sign    Days of Exercise per Week: 0 days    Minutes of Exercise per Session: 0 min  Stress: No Stress Concern Present (08/09/2020)   [employer] of Occupational Health - Occupational Stress Questionnaire    Feeling of Stress : Not at all  Social Connections: Moderately Integrated (08/09/2020)   Social Connection and Isolation Panel [NHANES]    Frequency of Communication with Friends and Family: More than three times a week    Frequency of Social Gatherings with Friends and Family: More than three times a week    Attends Religious Services: More than 4 times per year    Active Member of [employer] or Organizations: No    Attends Banker Meetings: Never    Marital Status: Married  Catering manager Violence: Patient Unable To Answer (12/06/2022)   Humiliation, Afraid, Rape, and Kick questionnaire    Fear of Current or Ex-Partner: Patient unable to answer    Emotionally Abused: Patient unable to answer    Physically Abused: Patient unable to answer    Sexually Abused: Patient unable to answer     Family History  Problem  Relation Age of Onset   Congestive Heart Failure Mother    Coronary artery disease Father 88   Breast cancer Sister 31   Heart disease Brother    Varicose Veins Brother    Alcohol abuse Brother     ROS: Otherwise negative unless mentioned in HPI  Physical Examination  Vitals:  12/18/22 0500 12/18/22 0600  BP: (!) 86/43 (!) 91/51  Pulse: 65 67  Resp: 20 13  Temp:    SpO2: 95% 95%   Body mass index is 34.5 kg/m.  General:  WDWN in NAD Gait: Not observed HENT: WNL, normocephalic Pulmonary: normal non-labored breathing, without Rales, rhonchi,  wheezing Cardiac: regular, without  Murmurs, rubs or gallops; without carotid bruits Abdomen: Positive bowel sounds throughout, soft, NT/ND, no masses Skin: without rashes Vascular Exam/Pulses: Mobile pulses throughout.  Noted to have left BKA. Extremities: without ischemic changes, without Gangrene , without cellulitis; without open wounds;  Musculoskeletal: no muscle wasting or atrophy  Neurologic: A&O X 3;  No focal weakness or paresthesias are detected; speech is fluent/normal Psychiatric:  The pt has Normal affect. Lymph:  Unremarkable  CBC    Component Value Date/Time   WBC 19.6 (H) 12/18/2022 0449   RBC 3.16 (L) 12/18/2022 0449   HGB 8.8 (L) 12/18/2022 0449   HGB 13.5 08/11/2013 0933   HCT 28.1 (L) 12/18/2022 0449   HCT 42.6 08/11/2013 0933   PLT 363 12/18/2022 0449   PLT 300 08/11/2013 0933   MCV 88.9 12/18/2022 0449   MCV 79 (L) 08/11/2013 0933   MCH 27.8 12/18/2022 0449   MCHC 31.3 12/18/2022 0449   RDW 21.9 (H) 12/18/2022 0449   RDW 14.1 08/11/2013 0933   LYMPHSABS 0.6 (L) 12/05/2022 2056   LYMPHSABS 3.2 08/11/2013 0933   MONOABS 0.8 12/05/2022 2056   MONOABS 0.6 08/11/2013 0933   EOSABS 0.0 12/05/2022 2056   EOSABS 1.2 (H) 08/11/2013 0933   BASOSABS 0.1 12/05/2022 2056   BASOSABS 0.1 08/11/2013 0933    BMET    Component Value Date/Time   NA 136 12/18/2022 0449   NA 138 08/11/2013 0933   K 3.5  12/18/2022 0449   K 3.3 (L) 08/11/2013 0933   CL 100 12/18/2022 0449   CL 102 08/11/2013 0933   CO2 27 12/18/2022 0449   CO2 32 08/11/2013 0933   GLUCOSE 112 (H) 12/18/2022 0449   GLUCOSE 107 (H) 08/11/2013 0933   BUN 72 (H) 12/18/2022 0449   BUN 13 08/11/2013 0933   CREATININE 2.18 (H) 12/18/2022 0449   CREATININE 0.98 02/11/2021 0941   CALCIUM 8.7 (L) 12/18/2022 0449   CALCIUM 9.2 08/11/2013 0933   GFRNONAA 23 (L) 12/18/2022 0449   GFRNONAA 61 07/14/2019 0940   GFRAA 71 07/14/2019 0940    COAGS: Lab Results  Component Value Date   INR 1.2 12/06/2022   INR 1.1 12/05/2022   INR 1.2 05/28/2021     Non-Invasive Vascular Imaging:   None Ordered  Statin:  No. Beta Blocker:  No. Aspirin:  No. ACEI:  No. ARB:  No. CCB use:  No Other antiplatelets/anticoagulants:  No.    ASSESSMENT/PLAN: This is a 72 y.o. female is admitted for MRSA bacteremia in the setting of a permacath infection.  Patient's permacath was removed on 12/06/2022.  Hemodialysis temporary access was placed at that time.  Patient has recovered well and is now needing dialysis permacatheter placement for hemodialysis.  Vascular surgery was consulted.  Vascular surgery plans on taking the patient to the vascular lab tomorrow 12/19/2022 for placement of dialysis permacatheter access.  I discussed the procedure in detail with the patient's daughter at the bedside this afternoon.  We discussed the procedure, benefits, risk, and complications.  Daughter verbalizes her understanding and wishes to proceed to soon as possible.  I answered all the daughter's questions.  The patient  will be made n.p.o. after midnight for procedure tomorrow.   -I discussed the plan in detail with Dr. Festus Barren MD and he is in agreement with the plan.   Marcie Bal [facility] 12/18/2022 9:18 AM

## 2022-12-19 ENCOUNTER — Encounter
Admission: EM | Disposition: A | Discharge status: Home Health | Payer: Self-pay | Source: Skilled Nursing Facility | Attending: Internal Medicine

## 2022-12-19 ENCOUNTER — Encounter: Payer: Self-pay | Admitting: Vascular Surgery

## 2022-12-19 DIAGNOSIS — Z9889 Other specified postprocedural states: Secondary | ICD-10-CM | POA: Diagnosis not present

## 2022-12-19 DIAGNOSIS — N186 End stage renal disease: Secondary | ICD-10-CM | POA: Diagnosis not present

## 2022-12-19 DIAGNOSIS — Z992 Dependence on renal dialysis: Secondary | ICD-10-CM | POA: Diagnosis not present

## 2022-12-19 DIAGNOSIS — R6521 Severe sepsis with septic shock: Secondary | ICD-10-CM | POA: Diagnosis not present

## 2022-12-19 DIAGNOSIS — A419 Sepsis, unspecified organism: Secondary | ICD-10-CM | POA: Diagnosis not present

## 2022-12-19 DIAGNOSIS — G9341 Metabolic encephalopathy: Secondary | ICD-10-CM | POA: Diagnosis not present

## 2022-12-19 DIAGNOSIS — B9562 Methicillin resistant Staphylococcus aureus infection as the cause of diseases classified elsewhere: Secondary | ICD-10-CM

## 2022-12-19 HISTORY — PX: DIALYSIS/PERMA CATHETER INSERTION: CATH118288

## 2022-12-19 LAB — CBC
HCT: 29.7 % — ABNORMAL LOW (ref 36.0–46.0)
Hemoglobin: 9.1 g/dL — ABNORMAL LOW (ref 12.0–15.0)
MCH: 27.1 pg (ref 26.0–34.0)
MCHC: 30.6 g/dL (ref 30.0–36.0)
MCV: 88.4 fL (ref 80.0–100.0)
Platelets: 461 10*3/uL — ABNORMAL HIGH (ref 150–400)
RBC: 3.36 MIL/uL — ABNORMAL LOW (ref 3.87–5.11)
RDW: 22.5 % — ABNORMAL HIGH (ref 11.5–15.5)
WBC: 22.1 10*3/uL — ABNORMAL HIGH (ref 4.0–10.5)
nRBC: 0.2 % (ref 0.0–0.2)

## 2022-12-19 LAB — RENAL FUNCTION PANEL
Albumin: 2.6 g/dL — ABNORMAL LOW (ref 3.5–5.0)
Anion gap: 9 (ref 5–15)
BUN: 81 mg/dL — ABNORMAL HIGH (ref 8–23)
CO2: 27 mmol/L (ref 22–32)
Calcium: 9.2 mg/dL (ref 8.9–10.3)
Chloride: 101 mmol/L (ref 98–111)
Creatinine, Ser: 2.65 mg/dL — ABNORMAL HIGH (ref 0.44–1.00)
GFR, Estimated: 18 mL/min — ABNORMAL LOW (ref >60)
Glucose, Bld: 129 mg/dL — ABNORMAL HIGH (ref 70–99)
Phosphorus: 4.3 mg/dL (ref 2.5–4.6)
Potassium: 3.7 mmol/L (ref 3.5–5.1)
Sodium: 137 mmol/L (ref 135–145)

## 2022-12-19 LAB — CK: Total CK: 16 U/L — ABNORMAL LOW (ref 38–234)

## 2022-12-19 LAB — GLUCOSE, CAPILLARY
Glucose-Capillary: 102 mg/dL — ABNORMAL HIGH (ref 70–99)
Glucose-Capillary: 117 mg/dL — ABNORMAL HIGH (ref 70–99)
Glucose-Capillary: 121 mg/dL — ABNORMAL HIGH (ref 70–99)
Glucose-Capillary: 123 mg/dL — ABNORMAL HIGH (ref 70–99)
Glucose-Capillary: 132 mg/dL — ABNORMAL HIGH (ref 70–99)
Glucose-Capillary: 152 mg/dL — ABNORMAL HIGH (ref 70–99)

## 2022-12-19 LAB — MAGNESIUM: Magnesium: 2 mg/dL (ref 1.7–2.4)

## 2022-12-19 SURGERY — DIALYSIS/PERMA CATHETER INSERTION
Anesthesia: Moderate Sedation

## 2022-12-19 MED ORDER — MIDAZOLAM HCL 2 MG/2ML IJ SOLN
INTRAMUSCULAR | Status: DC | PRN
  Administered 2022-12-19: 2 mg via INTRAVENOUS

## 2022-12-19 MED ORDER — HEPARIN (PORCINE) IN NACL 1000-0.9 UT/500ML-% IV SOLN
INTRAVENOUS | Status: DC | PRN
  Administered 2022-12-19: 500 mL

## 2022-12-19 MED ORDER — DIPHENHYDRAMINE HCL 50 MG/ML IJ SOLN: 50.0000 mg | Freq: Once | INTRAMUSCULAR | Status: DC | PRN

## 2022-12-19 MED ORDER — HYDROMORPHONE HCL 1 MG/ML IJ SOLN: 1.0000 mg | Freq: Once | INTRAMUSCULAR | Status: DC | PRN

## 2022-12-19 MED ORDER — MIDAZOLAM HCL 2 MG/2ML IJ SOLN
INTRAMUSCULAR | Status: AC
  Filled 2022-12-19: qty 2

## 2022-12-19 MED ORDER — CEFAZOLIN SODIUM-DEXTROSE 1-4 GM/50ML-% IV SOLN
INTRAVENOUS | Status: AC
  Filled 2022-12-19: qty 50

## 2022-12-19 MED ORDER — MIDAZOLAM HCL 2 MG/ML PO SYRP: 8.0000 mg | ORAL_SOLUTION | Freq: Once | ORAL | Status: DC | PRN

## 2022-12-19 MED ORDER — SODIUM CHLORIDE 0.9 % IV SOLN
500.0000 mg | INTRAVENOUS | Status: DC
  Filled 2022-12-19: qty 10

## 2022-12-19 MED ORDER — FAMOTIDINE 20 MG PO TABS: 40.0000 mg | ORAL_TABLET | Freq: Once | ORAL | Status: DC | PRN

## 2022-12-19 MED ORDER — FENTANYL CITRATE PF 50 MCG/ML IJ SOSY: 12.5000 ug | PREFILLED_SYRINGE | Freq: Once | INTRAMUSCULAR | Status: DC | PRN

## 2022-12-19 MED ORDER — METHYLPREDNISOLONE SODIUM SUCC 125 MG IJ SOLR: 125.0000 mg | Freq: Once | INTRAMUSCULAR | Status: DC | PRN

## 2022-12-19 MED ORDER — SODIUM CHLORIDE 0.9 % IV SOLN: INTRAVENOUS | Status: DC

## 2022-12-19 MED ORDER — CEFAZOLIN SODIUM-DEXTROSE 1-4 GM/50ML-% IV SOLN
1.0000 g | INTRAVENOUS | Status: AC
  Administered 2022-12-19: 1 g via INTRAVENOUS

## 2022-12-19 MED ORDER — LIDOCAINE-EPINEPHRINE (PF) 1 %-1:200000 IJ SOLN
INTRAMUSCULAR | Status: DC | PRN
  Administered 2022-12-19: 20 mL

## 2022-12-19 MED ORDER — ONDANSETRON HCL 4 MG/2ML IJ SOLN: 4.0000 mg | Freq: Four times a day (QID) | INTRAMUSCULAR | Status: DC | PRN

## 2022-12-19 SURGICAL SUPPLY — 7 items
ADH SKN CLS APL DERMABOND .7 (GAUZE/BANDAGES/DRESSINGS) ×1
BIOPATCH RED 1 DISK 7.0 (GAUZE/BANDAGES/DRESSINGS) IMPLANT
CATH CANNON HEMO 15FR 23CM (HEMODIALYSIS SUPPLIES) IMPLANT
COVER PROBE ULTRASOUND 5X96 (MISCELLANEOUS) IMPLANT
DERMABOND ADVANCED .7 DNX12 (GAUZE/BANDAGES/DRESSINGS) IMPLANT
SUT MNCRL AB 4-0 PS2 18 (SUTURE) IMPLANT
SUT PROLENE 0 CT 1 30 (SUTURE) IMPLANT

## 2022-12-19 NOTE — Progress Notes (Signed)
OT Cancellation Note  Patient Details Name: Alexandra Foster MRN: [id number] DOB: 12-08-1950   Cancelled Treatment:    Reason Eval/Treat Not Completed: Patient at procedure or test/ unavailable. Pt currently off unit at cath lab. OT to re-attempt as time allows and pt is available.   [name], MS, OTR/L , CBIS ascom [phone]  12/19/22, 9:22 AM

## 2022-12-19 NOTE — Progress Notes (Addendum)
[facility]  ROUNDING NOTE   Subjective:    Seen ICU  Patient critically ill.    No pressors in 24 hours Pruritis present  Lt permcath placed today Right femoral temp cath in place  Nocturnal Tube feeds via G tube at 16ml/hr    Objective:  Vital signs in last 24 hours:  Temp:  [97 F (36.1 C)-98.3 F (36.8 C)] 98.3 F (36.8 C) (09/06 1200) Pulse Rate:  [0-74] 59 (09/06 1200) Resp:  [11-30] 18 (09/06 1200) BP: (84-113)/(37-88) 99/50 (09/06 1200) SpO2:  [91 %-100 %] 95 % (09/06 1200)  Weight change:  Filed Weights   12/16/22 1145 12/16/22 1517 12/18/22 0500  Weight: 89.8 kg 89.8 kg 91.2 kg    Intake/Output: I/O last 3 completed shifts: In: 2164.4 [Other:360; NG/GT:1713.9; IV Piggyback:90.5] Out: 375 [Urine:100; Drains:275]   Intake/Output this shift:  No intake/output data recorded.  Physical Exam: General: Critically ill  Head: Normocephalic, atraumatic. Moist oral mucosal membranes  Eyes: Anicteric  Lungs:  Diminished bilaterally   Heart: regular   Abdomen:  Soft, nontender  Extremities:  1+ peripheral edema.  Neurologic: Alert to self and place,    Access:  Right femoral temp HD catheter 8/29.  LT permcath placed on 12/19/22    Basic Metabolic Panel: Recent Labs  Lab 12/14/22 0440 12/15/22 0521 12/16/22 0531 12/17/22 0637 12/18/22 0449 12/19/22 1224  NA 136 137 136 132* 136 137  K 3.6 3.4* 3.4* 3.2* 3.5 3.7  CL 98 101 99 99 100 101  CO2 27 29 27 27 27 27   GLUCOSE 160* 159* 148* 127* 112* 129*  BUN 43* 61* 75* 51* 72* 81*  CREATININE 1.72* 2.40* 2.77* 1.83* 2.18* 2.65*  CALCIUM 8.2* 8.1* 8.5* 8.1* 8.7* 9.2  MG 1.5* 2.7*  --   --  1.9 2.0  PHOS 2.3* 3.9 4.1 2.7 3.0 4.3    Liver Function Tests: Recent Labs  Lab 12/15/22 0521 12/16/22 0531 12/17/22 0637 12/18/22 0449 12/19/22 1224  ALBUMIN 1.9* 2.5* 2.4* 2.8* 2.6*   No results for input(s): "LIPASE", "AMYLASE" in the last 168 hours. No results for input(s): "AMMONIA" in  the last 168 hours.  CBC: Recent Labs  Lab 12/15/22 0521 12/16/22 0531 12/17/22 0637 12/18/22 0449 12/19/22 1224  WBC 22.6* 18.2* 19.1* 19.6* 22.1*  HGB 7.4* 7.1* 9.2* 8.8* 9.1*  HCT 25.2* 24.0* 28.7* 28.1* 29.7*  MCV 91.3 92.0 85.2 88.9 88.4  PLT 353 385 357 363 461*    Cardiac Enzymes: Recent Labs  Lab 12/19/22 1224  CKTOTAL 16*    BNP: Invalid input(s): "POCBNP"  CBG: Recent Labs  Lab 12/18/22 2010 12/18/22 2356 12/19/22 0734 12/19/22 0825 12/19/22 1126  GLUCAP 117* 101* 132* 152* 123*    Microbiology: Results for orders placed or performed during the hospital encounter of 12/05/22  Culture, blood (Routine x 2)     Status: Abnormal   Collection Time: 12/05/22  8:56 PM   Specimen: BLOOD  Result Value Ref Range Status   Specimen Description   Final    BLOOD BLOOD RIGHT ARM Performed at [facility], [address] [place], [place] [address]    Special Requests   Final    BOTTLES DRAWN AEROBIC AND ANAEROBIC Blood Culture adequate volume Performed at [facility], [address] [place], [place] [address]    Culture  Setup Time   Final    GRAM POSITIVE COCCI IN BOTH AEROBIC AND ANAEROBIC BOTTLES CRITICAL RESULT CALLED TO, READ BACK BY  AND VERIFIED WITH: SHEEMA HALLLAJI AT 1206 12/06/22.PMF Performed at [facility], [address] [place], [place] [address]    Culture (A)  Final    STAPHYLOCOCCUS AUREUS SUSCEPTIBILITIES PERFORMED ON PREVIOUS CULTURE WITHIN THE LAST 5 DAYS. Performed at [facility], [address], [place], [place] [address]    Report Status 12/08/2022 FINAL  Final  Culture, blood (Routine x 2)     Status: Abnormal   Collection Time: 12/05/22  8:57 PM   Specimen: BLOOD  Result Value Ref Range Status   Specimen Description   Final    BLOOD BLOOD LEFT ARM Performed at [facility], [address], [place], [place] [address]    Special Requests   Final    BOTTLES DRAWN AEROBIC AND  ANAEROBIC Blood Culture adequate volume Performed at [facility], [address] [place], [place] [address]    Culture  Setup Time   Final    GRAM POSITIVE COCCI IN BOTH AEROBIC AND ANAEROBIC BOTTLES CRITICAL RESULT CALLED TO, READ BACK BY AND VERIFIED WITH: SHEEMA HALLAJI AT 1206 12/06/22.PMF Performed at [facility], [address] [place], [place] [address]    Culture METHICILLIN RESISTANT STAPHYLOCOCCUS AUREUS (A)  Final   Report Status 12/08/2022 FINAL  Final   Organism ID, Bacteria METHICILLIN RESISTANT STAPHYLOCOCCUS AUREUS  Final      Susceptibility   Methicillin resistant staphylococcus aureus - MIC*    CIPROFLOXACIN >=8 RESISTANT Resistant     ERYTHROMYCIN >=8 RESISTANT Resistant     GENTAMICIN <=0.5 SENSITIVE Sensitive     OXACILLIN >=4 RESISTANT Resistant     TETRACYCLINE <=1 SENSITIVE Sensitive     VANCOMYCIN <=0.5 SENSITIVE Sensitive     TRIMETH/SULFA >=320 RESISTANT Resistant     CLINDAMYCIN <=0.25 SENSITIVE Sensitive     RIFAMPIN <=0.5 SENSITIVE Sensitive     Inducible Clindamycin NEGATIVE Sensitive     LINEZOLID 2 SENSITIVE Sensitive     * METHICILLIN RESISTANT STAPHYLOCOCCUS AUREUS  Blood Culture ID Panel (Reflexed)     Status: Abnormal   Collection Time: 12/05/22  8:57 PM  Result Value Ref Range Status   Enterococcus faecalis NOT DETECTED NOT DETECTED Final   Enterococcus Faecium NOT DETECTED NOT DETECTED Final   Listeria monocytogenes NOT DETECTED NOT DETECTED Final   Staphylococcus species DETECTED (A) NOT DETECTED Final    Comment: CRITICAL RESULT CALLED TO, READ BACK BY AND VERIFIED WITH: SHEEMA HALLAJI AT 1206 12/06/22.PMF    Staphylococcus aureus (BCID) DETECTED (A) NOT DETECTED Final    Comment: Methicillin (oxacillin)-resistant Staphylococcus aureus (MRSA). MRSA is predictably resistant to beta-lactam antibiotics (except ceftaroline). Preferred therapy is vancomycin unless clinically contraindicated. Patient requires contact  precautions if  hospitalized. CRITICAL RESULT CALLED TO, READ BACK BY AND VERIFIED WITH: SHEEMA HALLAJI AT 1206 12/06/22.PMF    Staphylococcus epidermidis DETECTED (A) NOT DETECTED Final    Comment: CRITICAL RESULT CALLED TO, READ BACK BY AND VERIFIED WITH: SHEEMA HALLAJI AT 1206 12/06/22.PMF    Staphylococcus lugdunensis NOT DETECTED NOT DETECTED Final   Streptococcus species NOT DETECTED NOT DETECTED Final   Streptococcus agalactiae NOT DETECTED NOT DETECTED Final   Streptococcus pneumoniae NOT DETECTED NOT DETECTED Final   Streptococcus pyogenes NOT DETECTED NOT DETECTED Final   A.calcoaceticus-baumannii NOT DETECTED NOT DETECTED Final   Bacteroides fragilis NOT DETECTED NOT DETECTED Final   Enterobacterales NOT DETECTED NOT DETECTED Final   Enterobacter cloacae complex NOT DETECTED NOT DETECTED Final   Escherichia coli NOT DETECTED NOT  DETECTED Final   Klebsiella aerogenes NOT DETECTED NOT DETECTED Final   Klebsiella oxytoca NOT DETECTED NOT DETECTED Final   Klebsiella pneumoniae NOT DETECTED NOT DETECTED Final   Proteus species NOT DETECTED NOT DETECTED Final   Salmonella species NOT DETECTED NOT DETECTED Final   Serratia marcescens NOT DETECTED NOT DETECTED Final   Haemophilus influenzae NOT DETECTED NOT DETECTED Final   Neisseria meningitidis NOT DETECTED NOT DETECTED Final   Pseudomonas aeruginosa NOT DETECTED NOT DETECTED Final   Stenotrophomonas maltophilia NOT DETECTED NOT DETECTED Final   Candida albicans NOT DETECTED NOT DETECTED Final   Candida auris NOT DETECTED NOT DETECTED Final   Candida glabrata NOT DETECTED NOT DETECTED Final   Candida krusei NOT DETECTED NOT DETECTED Final   Candida parapsilosis NOT DETECTED NOT DETECTED Final   Candida tropicalis NOT DETECTED NOT DETECTED Final   Cryptococcus neoformans/gattii NOT DETECTED NOT DETECTED Final   Methicillin resistance mecA/C DETECTED (A) NOT DETECTED Final    Comment: CRITICAL RESULT CALLED TO, READ BACK BY AND  VERIFIED WITH: SHEEMA HALLAJI AT 1206 12/06/22.PMF    Meth resistant mecA/C and MREJ DETECTED (A) NOT DETECTED Final    Comment: CRITICAL RESULT CALLED TO, READ BACK BY AND VERIFIED WITH: SHEEMA HALLAJI AT 1206 12/06/22.PMF Performed at [facility], [address] [place], [place] [address]   Resp panel by RT-PCR (RSV, Flu A&B, Covid) Anterior Nasal Swab     Status: None   Collection Time: 12/05/22  9:53 PM   Specimen: Anterior Nasal Swab  Result Value Ref Range Status   SARS Coronavirus 2 by RT PCR NEGATIVE NEGATIVE Final    Comment: (NOTE) SARS-CoV-2 target nucleic acids are NOT DETECTED.  The SARS-CoV-2 RNA is generally detectable in upper respiratory specimens during the acute phase of infection. The lowest concentration of SARS-CoV-2 viral copies this assay can detect is 138 copies/mL. A negative result does not preclude SARS-Cov-2 infection and should not be used as the sole basis for treatment or other patient management decisions. A negative result may occur with  improper specimen collection/handling, submission of specimen other than nasopharyngeal swab, presence of viral mutation(s) within the areas targeted by this assay, and inadequate number of viral copies(<138 copies/mL). A negative result must be combined with clinical observations, patient history, and epidemiological information. The expected result is Negative.  Fact Sheet for Patients:  BloggerCourse.com  Fact Sheet for Healthcare Providers:  SeriousBroker.it  This test is no t yet approved or cleared by the [place] FDA and  has been authorized for detection and/or diagnosis of SARS-CoV-2 by FDA under an Emergency Use Authorization (EUA). This EUA will remain  in effect (meaning this test can be used) for the duration of the COVID-19 declaration under Section 564(b)(1) of the Act, 21 U.S.C.section 360bbb-3(b)(1), unless the authorization is  terminated  or revoked sooner.       Influenza A by PCR NEGATIVE NEGATIVE Final   Influenza B by PCR NEGATIVE NEGATIVE Final    Comment: (NOTE) The Xpert Xpress SARS-CoV-2/FLU/RSV plus assay is intended as an aid in the diagnosis of influenza from Nasopharyngeal swab specimens and should not be used as a sole basis for treatment. Nasal washings and aspirates are unacceptable for Xpert Xpress SARS-CoV-2/FLU/RSV testing.  Fact Sheet for Patients: BloggerCourse.com  Fact Sheet for Healthcare Providers: SeriousBroker.it  This test is not yet approved or cleared by the [place] FDA and has been authorized for detection and/or diagnosis of SARS-CoV-2 by FDA under an Emergency Use  Authorization (EUA). This EUA will remain in effect (meaning this test can be used) for the duration of the COVID-19 declaration under Section 564(b)(1) of the Act, 21 U.S.C. section 360bbb-3(b)(1), unless the authorization is terminated or revoked.     Resp Syncytial Virus by PCR NEGATIVE NEGATIVE Final    Comment: (NOTE) Fact Sheet for Patients: BloggerCourse.com  Fact Sheet for Healthcare Providers: SeriousBroker.it  This test is not yet approved or cleared by the [place] FDA and has been authorized for detection and/or diagnosis of SARS-CoV-2 by FDA under an Emergency Use Authorization (EUA). This EUA will remain in effect (meaning this test can be used) for the duration of the COVID-19 declaration under Section 564(b)(1) of the Act, 21 U.S.C. section 360bbb-3(b)(1), unless the authorization is terminated or revoked.  Performed at [facility], [address] [place], [place] [address]   MRSA Next Gen by PCR, Nasal     Status: Abnormal   Collection Time: 12/06/22  4:27 AM   Specimen: Nasal Mucosa; Nasal Swab  Result Value Ref Range Status   MRSA by PCR Next Gen DETECTED (A)  NOT DETECTED Final    Comment: RESULT CALLED TO, READ BACK BY AND VERIFIED WITH: KRISTINE CHAMBERS AT 6045 12/06/22.PMF (NOTE) The GeneXpert MRSA Assay (FDA approved for NASAL specimens only), is one component of a comprehensive MRSA colonization surveillance program. It is not intended to diagnose MRSA infection nor to guide or monitor treatment for MRSA infections. Test performance is not FDA approved in patients less than 57 years old. Performed at [facility], [address], [place], [place] [address]   Cath Tip Culture     Status: Abnormal   Collection Time: 12/06/22  2:22 PM   Specimen: Catheter Tip; Other  Result Value Ref Range Status   Specimen Description   Final    CATH TIP Performed at [facility], [address] [place], [place] [address]    Special Requests   Final    NONE Performed at [facility], [address] [place], [place] [address]    Culture (A)  Final    >=100,000 COLONIES/mL METHICILLIN RESISTANT STAPHYLOCOCCUS AUREUS   Report Status 12/08/2022 FINAL  Final   Organism ID, Bacteria METHICILLIN RESISTANT STAPHYLOCOCCUS AUREUS (A)  Final      Susceptibility   Methicillin resistant staphylococcus aureus - MIC*    CIPROFLOXACIN >=8 RESISTANT Resistant     ERYTHROMYCIN >=8 RESISTANT Resistant     GENTAMICIN <=0.5 SENSITIVE Sensitive     OXACILLIN >=4 RESISTANT Resistant     TETRACYCLINE <=1 SENSITIVE Sensitive     VANCOMYCIN <=0.5 SENSITIVE Sensitive     TRIMETH/SULFA >=320 RESISTANT Resistant     CLINDAMYCIN <=0.25 SENSITIVE Sensitive     RIFAMPIN <=0.5 SENSITIVE Sensitive     Inducible Clindamycin NEGATIVE Sensitive     LINEZOLID 2 SENSITIVE Sensitive     * >=100,000 COLONIES/mL METHICILLIN RESISTANT STAPHYLOCOCCUS AUREUS  Urine Culture (for pregnant, neutropenic or urologic patients or patients with an indwelling urinary catheter)     Status: Abnormal   Collection Time: 12/06/22  5:41 PM   Specimen: Urine, Clean  Catch  Result Value Ref Range Status   Specimen Description   Final    URINE, CLEAN CATCH Performed at [facility], [address] [place], [place] [address]    Special Requests   Final    Normal Performed at [facility], [address] [place], [place] [address]    Culture (  A)  Final    70,000 COLONIES/mL ENTEROCOCCUS FAECALIS 70,000 COLONIES/mL YEAST    Report Status 12/09/2022 FINAL  Final   Organism ID, Bacteria ENTEROCOCCUS FAECALIS (A)  Final      Susceptibility   Enterococcus faecalis - MIC*    AMPICILLIN <=2 SENSITIVE Sensitive     NITROFURANTOIN <=16 SENSITIVE Sensitive     VANCOMYCIN 1 SENSITIVE Sensitive     * 70,000 COLONIES/mL ENTEROCOCCUS FAECALIS  Culture, blood (Routine X 2) w Reflex to ID Panel     Status: None   Collection Time: 12/07/22  1:31 PM   Specimen: BLOOD  Result Value Ref Range Status   Specimen Description BLOOD BLOOD RIGHT HAND  Final   Special Requests   Final    BOTTLES DRAWN AEROBIC ONLY Blood Culture adequate volume   Culture   Final    NO GROWTH 5 DAYS Performed at [facility], [address] [place], [place] [address]    Report Status 12/12/2022 FINAL  Final  Culture, blood (Routine X 2) w Reflex to ID Panel     Status: None   Collection Time: 12/07/22  6:41 PM   Specimen: BLOOD  Result Value Ref Range Status   Specimen Description BLOOD BLOOD RIGHT HAND  Final   Special Requests   Final    BOTTLES DRAWN AEROBIC AND ANAEROBIC Blood Culture results may not be optimal due to an inadequate volume of blood received in culture bottles   Culture   Final    NO GROWTH 5 DAYS Performed at [facility], [address], [place], [place] [address]    Report Status 12/12/2022 FINAL  Final  C Difficile Quick Screen (NO PCR Reflex)     Status: None   Collection Time: 12/12/22  4:07 PM   Specimen: STOOL  Result Value Ref Range Status   C Diff antigen NEGATIVE NEGATIVE Final   C Diff toxin NEGATIVE  NEGATIVE Final   C Diff interpretation No C. difficile detected.  Final    Comment: Performed at [facility], [address] [place], [place] [address]  Gastrointestinal Panel by PCR , Stool     Status: None   Collection Time: 12/13/22  8:38 AM   Specimen: Stool  Result Value Ref Range Status   Campylobacter species NOT DETECTED NOT DETECTED Final   Plesimonas shigelloides NOT DETECTED NOT DETECTED Final   Salmonella species NOT DETECTED NOT DETECTED Final   Yersinia enterocolitica NOT DETECTED NOT DETECTED Final   Vibrio species NOT DETECTED NOT DETECTED Final   Vibrio cholerae NOT DETECTED NOT DETECTED Final   Enteroaggregative E coli (EAEC) NOT DETECTED NOT DETECTED Final   Enteropathogenic E coli (EPEC) NOT DETECTED NOT DETECTED Final   Enterotoxigenic E coli (ETEC) NOT DETECTED NOT DETECTED Final   Shiga like toxin producing E coli (STEC) NOT DETECTED NOT DETECTED Final   Shigella/Enteroinvasive E coli (EIEC) NOT DETECTED NOT DETECTED Final   Cryptosporidium NOT DETECTED NOT DETECTED Final   Cyclospora cayetanensis NOT DETECTED NOT DETECTED Final   Entamoeba histolytica NOT DETECTED NOT DETECTED Final   Giardia lamblia NOT DETECTED NOT DETECTED Final   Adenovirus F40/41 NOT DETECTED NOT DETECTED Final   Astrovirus NOT DETECTED NOT DETECTED Final   Norovirus GI/GII NOT DETECTED NOT DETECTED Final   Rotavirus A NOT DETECTED NOT DETECTED Final   Sapovirus (I, II, IV, and V) NOT DETECTED NOT DETECTED Final    Comment: Performed at [facility], [address]  [place], [place] [address]    Coagulation Studies: No results for input(s): "LABPROT", "INR" in the last 72 hours.   Urinalysis: No results for input(s): "COLORURINE", "LABSPEC", "PHURINE", "GLUCOSEU", "HGBUR", "BILIRUBINUR", "KETONESUR", "PROTEINUR", "UROBILINOGEN", "NITRITE", "LEUKOCYTESUR" in the last 72 hours.  Invalid input(s): "APPERANCEUR"     Imaging: PERIPHERAL VASCULAR  CATHETERIZATION  Result Date: 12/19/2022 See surgical note for result.    Medications:    sodium chloride Stopped (12/15/22 0035)   sodium chloride Stopped (12/08/22 1823)   anticoagulant sodium citrate      sodium chloride   Intravenous Once   amiodarone  200 mg Per Tube BID   ascorbic acid  500 mg Per Tube BID   atorvastatin  40 mg Per Tube Daily   chlorhexidine  15 mL Mouth Rinse BID   Chlorhexidine Gluconate Cloth  6 each Topical Q0600   vitamin D3  2,000 Units Per Tube Daily   copper  2 mg Per Tube Daily   droxidopa  300 mg Per Tube TID WC   epoetin (EPOGEN/PROCRIT) injection  10,000 Units Intravenous Q T,Th,Sa-HD   escitalopram  10 mg Per Tube Daily   famotidine  10 mg Per Tube Daily   feeding supplement (NEPRO CARB STEADY)  1,000 mL Per Tube Q24H   feeding supplement (PROSource TF20)  60 mL Per Tube Daily   fludrocortisone  0.2 mg Per Tube Daily   free water  30 mL Per Tube Q4H   heparin injection (subcutaneous)  5,000 Units Subcutaneous Q8H   hydrOXYzine  50 mg Per Tube Q6H   insulin aspart  0-6 Units Subcutaneous Q4H   midodrine  20 mg Per Tube TID WC   multivitamin  1 tablet Per Tube QHS   mupirocin ointment   Nasal BID   nutrition supplement (JUVEN)  1 packet Per Tube BID BM   mouth rinse  15 mL Mouth Rinse 4 times per day   phenylephrine  10 mg Per J Tube BID   sodium chloride, acetaminophen **OR** acetaminophen, alteplase, alteplase, anticoagulant sodium citrate, artificial tears, clonazePAM, fentaNYL (SUBLIMAZE) injection, heparin, heparin, ipratropium-albuterol, lidocaine (PF), lidocaine-prilocaine, loperamide HCl, magnesium hydroxide, ondansetron **OR** ondansetron (ZOFRAN) IV, mouth rinse, pentafluoroprop-tetrafluoroeth, sennosides, traZODone  Assessment/ Plan:  [name] is a 72 y.o.  female with past medical history of diabetes, COPD, GERD, hypertension. OA, a fib, and end stage renal disease on hemodialysis. Patient presents to the ED from her  nursing facility complaining of altered mental status. She has been admitted for Lower urinary tract infectious disease [N39.0] Altered mental status, unspecified altered mental status type [R41.82] Sepsis due to gram-negative UTI (HCC) [A41.50, N39.0] Sepsis, due to unspecified organism, unspecified whether acute organ dysfunction present (HCC) [A41.9]  [facility]/TTS/Rt Permcath/? 81.3kg  End stage renal disease -Hemodialysis via femoral temp cath TTS schedule. - Scheduled to receive dialysis Saturday.  - Appreciate vascular placing lt chest permcath - Will remove rt groin temp cath after treatment tomorrow.  -Significant hypoalbuminemia. Will continue Albumin with dialysis.   2. Hypotension due to sepsis. Blood cultures and dialysis cath tip cultures with MRSA, Enterococcus (12/06/2022). Repeat cultures (12/07/2022) with no growth. However now with significant leukocytosis.   - Pressors remain held  -There is a concern about rash.  Antibiotics have been changed to daptomycin as per ID recommendations.  Suggested a course of 4 weeks starting 8/25 as day #1.  Antibiotics to be continued till 01/04/2023.   3. Anemia of chronic kidney disease : PRBC transfusion on 8/26,  9/3  - EPO with HD treatments  - Hgb 9.1, within goal   4. Secondary Hyperparathyroidism:  with hypophosphatemia.   - Calcium and phosphorus acceptable   5.  Hypokalemia  -Potassium 3.7      LOS: 13 Shantelle Breeze 9/6/20242:39 PM   Patient was seen and examined with Wendee Beavers, NP.  I personally formulated plan of care for the problems addressed and discussed with NP.  I agree with the note as documented except as noted below. I take the responsibility for the inherent risk of patient management.

## 2022-12-19 NOTE — Progress Notes (Signed)
Progress Note   Patient: Alexandra Foster ZOX:[id number] DOB: 01-Mar-1951 DOA: 12/05/2022     13 DOS: the patient was seen and examined on 12/19/2022   Brief hospital course: Alexandra Foster is a 72 y.o. female with past medical history significant for asthma, COPD, type diabetes mellitus, s/p G-tube, GERD, ESRD on HD, hypertension, osteoarthritis, paroxysmal atrial fibrillation and OSA on CPAP, who presented to [facility] ED on 12/05/22 from [facility] due to altered mental status with lethargy and decreased responsiveness. The patient's temperature was 102.2 when EMS arrived to this facility. The patient was not responding to questions throughout transport.  Patient had a fever of 101.4 in the emergency room, leukocytosis of 21.4, lactic acid of 2.3. Blood culture on 8/23 positive for MRSA and staph epidermis. 8/23: Presented from [facility] due to AMS, Code Stroke called due to slurred speech and weakness. Deemed not a candidate for thrombolytics due to no clear last known normal time.  Admitted by [facility] with Nephrology consultation for dialysis. 8/24: Rapid response called due to Hypotension prior to/during Hemodialysis.  Transfer to ICU for peripheral vasopressors and initiation of CRRT.  PCCM consulted to assist with vasopressors.  Blood cultures + for MRSA & Staph Epi. ID consulted. Vascular Surgery consulted, permcath removed.  Temporary HD catheter placed. 8/24: this morning with pronounce left facial droop and dysarthria, remains on CRRT on Levo @8  keep MAP>55 12/08/22: patient with multispecies sepsis, removing central line today due to bacteremia. Holding HD as per nephrology team due to shock.  12/09/22-patient with loose watery BM today, she does have skin breakdown over abd pannus.  She had re-initiation of her psychiatric medications today.  S/p pRBC transfusion overnight. Electrolytes are being repleted.  On room air but requires levophed still.  12/10/22- patient laying in bed with  no acute distress.  Levophed weaned to 71mcg/kg/min, afebrile on room air.  Handling NGT feeds well.  Purewick with no UOP this am. 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  12/11/22- patient off levophed , requires HD today, will place catheter per renal team.  Wound care to see for anal wound.  With episode of emesis during HD and concern for possible aspiration. 12/12/22- Back on low dose Levophed, weaning as able. Worsening Leukocytosis, ID plans to check for C.diff.  Ongoing discussions of when to remove newly placed temporary HD catheter. 12/13/22- Remains on levophed gtt despite scheduled midodrine and droxidopa.  HD session completed 12/14/22- No acute events overnight pt remains on levophed gtt.  Received HD. 12/15/22- No acute events overnight, remains on Levophed.  Increase Droxidopa to 300 mg TID. Leukocytosis improving. 12/16/22- No events overnight.  Remains on Levophed (less than yesterday, down to 5 mcg).  Will give 1 unit blood with HD today for Hgb 7.1.  Increase Midodrine to 20 mg TID.  Consult Cardiology for TEE, however pts family declined to proceed with TEE given possible risk associated with procedure  12/17/22: Pt remains on levophed gtt @2mcg /min.  US Venous Bilateral Lower Extremities revealed no evidence of acute or chronic DVT within the right lower extremity. 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     Principal Problem:   Septic shock (HCC) Active Problems:   MRSA bacteremia   Acute  metabolic encephalopathy   Paroxysmal atrial fibrillation (HCC)   Type 2 diabetes mellitus with chronic kidney disease, with long-term current use of insulin (HCC)   ESRD on  hemodialysis (HCC)   Anxiety and depression   Assessment and Plan: Septic shock with chronic hypotension. Bacteremia secondary to MRSA and staph epidermis. Dialysis catheter infection. Aspiration pneumonia bilateral lower lobes. Enterococcus faecalis UTI. Patient received vasopressors in the hospital, blood pressure is better.  Currently on midodrine, blood pressure is better. Septicemia was secondary to dialysis catheter infection.  Patient has been followed by ID, antibiotics as switched to daptomycin for 4 weeks.  Patient has refused TEE.  End-stage renal disease on dialysis. Hyponatremia  Hypokalemia. Patient has been followed by nephrology for scheduled dialysis.  Permacath was placed 9/6.  Acute on chronic anemia. Anemia of end-stage renal disease. Reactive thrombocytosis. Patient initially received blood transfusion, hemoglobin has been stabilized.  COPD. Obstructive sleep apnea on CPAP. Obesity with BMI 34.50. Appear to be stable.  Acute metabolic encephalopathy. Patient has been evaluate by neurology, no stroke.  Type 2 Diabetes. Patient is on every 4 hour sliding scale insulin with tube feeding.  Continue to follow.  Diarrhea. Appears to be secondary to antibiotics, C. difficile toxin negative.  Diarrhea seem to be better.  Severe debility. History of stroke with left-sided weakness. Patient is bedbound at baseline, very poor prognosis.  But family is not interested in palliative care versus hospice.    Subjective:  Patient is minimally responsive, spoke with ICU staff, patient has been like this entire stay in ICU. He is receiving tube feeding, she has not been eating.  Physical Exam: Vitals:   12/19/22 1000 12/19/22 1010 12/19/22 1100 12/19/22 1200  BP: (!) 89/37 99/80 (!) 88/54 (!) 99/50  Pulse: 69 71 64 (!) 59  Resp: 18 20 13 18   Temp:    98.3 F (36.8 C)  TempSrc:    Axillary  SpO2: 93% 96% 96% 95%  Weight:      Height:       General exam:  Appears calm and comfortable, obesity. Respiratory system: Clear to auscultation. Respiratory effort normal. Cardiovascular system: S1 & S2 heard, RRR. No JVD, murmurs, rubs, gallops or clicks. No pedal edema. Gastrointestinal system: Abdomen is nondistended, soft and nontender. No organomegaly or masses felt. Normal bowel sounds heard. Central nervous system: Drowsy and oriented to place and person, seem to have some aphasia, some left-sided weakness. Extremities: Symmetric 5 x 5 power. Skin: No rashes, lesions or ulcers Psychiatry: Flat affect.   Data Reviewed:  Review lab results, CT chest/abdomen/pelvis results.  Family Communication: None  Disposition: Status is: Inpatient Remains inpatient appropriate because: Severity of disease, IV treatment. Per TOC, family want to take patient home with home care.     Time spent: 55 minutes  Author: Marrion Coy, MD 12/19/2022 2:04 PM  For on call review [email or web address].

## 2022-12-19 NOTE — Hospital Course (Addendum)
Alexandra Foster is a 72 y.o. female with past medical history significant for asthma, COPD, type diabetes mellitus, s/p G-tube, GERD, ESRD on HD, hypertension, osteoarthritis, paroxysmal atrial fibrillation and OSA on CPAP, who presented to [facility] ED on 12/05/22 from [facility] due to altered mental status with lethargy and decreased responsiveness. The patient's temperature was 102.2 when EMS arrived to this facility. The patient was not responding to questions throughout transport.  Patient had a fever of 101.4 in the emergency room, leukocytosis of 21.4, lactic acid of 2.3. Blood culture on 8/23 positive for MRSA and staph epidermis. 8/23: Presented from [facility] due to AMS, Code Stroke called due to slurred speech and weakness. Deemed not a candidate for thrombolytics due to no clear last known normal time.  Admitted by [facility] with Nephrology consultation for dialysis. 8/24: Rapid response called due to Hypotension prior to/during Hemodialysis.  Transfer to ICU for peripheral vasopressors and initiation of CRRT.  PCCM consulted to assist with vasopressors.  Blood cultures + for MRSA & Staph Epi. ID consulted. Vascular Surgery consulted, permcath removed.  Temporary HD catheter placed. 8/24: this morning with pronounce left facial droop and dysarthria, remains on CRRT on Levo @8  keep MAP>55 12/08/22: patient with multispecies sepsis, removing central line today due to bacteremia. Holding HD as per nephrology team due to shock.  12/09/22-patient with loose watery BM today, she does have skin breakdown over abd pannus.  She had re-initiation of her psychiatric medications today.  S/p pRBC transfusion overnight. Electrolytes are being repleted.  On room air but requires levophed still.  12/10/22- patient laying in bed with no acute distress.  Levophed weaned to 8mcg/kg/min, afebrile on room air.  Handling NGT feeds well.  Purewick with no UOP this am. 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   12/11/22- patient off levophed , requires HD today, will place catheter per renal team.  Wound care to see for anal wound.  With episode of emesis during HD and concern for possible aspiration. 12/12/22- Back on low dose Levophed, weaning as able. Worsening Leukocytosis, ID plans to check for C.diff.  Ongoing discussions of when to remove newly placed temporary HD catheter. 12/13/22- Remains on levophed gtt despite scheduled midodrine and droxidopa.  HD session completed 12/14/22- No acute events overnight pt remains on levophed gtt.  Received HD. 12/15/22- No acute events overnight, remains on Levophed.  Increase Droxidopa to 300 mg TID. Leukocytosis improving. 12/16/22- No events overnight.  Remains on Levophed (less than yesterday, down to 5 mcg).  Will give 1 unit blood with HD today for Hgb 7.1.  Increase Midodrine to 20 mg TID.  Consult Cardiology for TEE, however pts family declined to proceed with TEE given possible risk associated with procedure  12/17/22: Pt remains on levophed gtt @2mcg /min.  US Venous Bilateral Lower Extremities revealed no evidence of acute or chronic DVT within the right lower extremity. 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    9/11.  Family clearing out space to prepare for her coming home.  Equipment will need to be delivered. 9/12.  Patient vomited today.  DuoNeb nebulized solution ordered.  1 dose of Zofran given.  Will get chest x-ray and abdominal x-ray.  [name] would like to  cut down to 4 cans of tube feeding today. 9/13.  Dietitian spoke with patient's daughter and they will do continuous feeding at night.

## 2022-12-19 NOTE — Progress Notes (Signed)
PHARMACY CONSULT NOTE  Pharmacy Consult for Electrolyte Monitoring and Replacement   Recent Labs: Potassium (mmol/L)  Date Value  12/19/2022 3.7  08/11/2013 3.3 (L)   Magnesium (mg/dL)  Date Value  81/19/1478 2.0   Calcium (mg/dL)  Date Value  29/56/2130 9.2   Calcium, Total (mg/dL)  Date Value  86/57/8469 9.2   Albumin (g/dL)  Date Value  62/95/2841 2.6 (L)  08/11/2013 3.1 (L)   Phosphorus (mg/dL)  Date Value  32/44/0102 4.3   Sodium (mmol/L)  Date Value  12/19/2022 137  08/11/2013 138    Assessment: 72 y.o. female with medical history significant for asthma, COPD, type diabetes mellitus, s/p G-tube, GERD, ESRD on HD, hypertension, osteoarthritis, paroxysmal atrial fibrillation and OSA on CPAP, who presented to the emergency room with acute onset of altered mental status with lethargy and decreased responsiveness, started on CRRT.  CRRT stopped 8/25. line holiday. CVC placed 8/29 and HD performed same day. Patient is on tube feeds.  Nutrition: Nepro Carb Steady @ 65 mL/hr + 30 mL of free water q4h  Pharmacy has been consulted to monitor and replace electrolytes.  Goal of Therapy:  Electrolytes WNL  Plan:  ---No electrolyte supplementation needed today ---Recheck electrolytes tomorrow morning  Burnis Medin, PharmD, BCPS

## 2022-12-19 NOTE — Progress Notes (Addendum)
Pharmacy Antibiotic Note  Alexandra Foster is a 72 y.o. female admitted on 12/05/2022 with bacteremia.  Bcx 4 of 4 GPC, BCID showing MRSA and Staph epidermidis. Patient started on CRRT 8/24 - stopped 8/25. Pharmacy has been consulted for daptomycin dosing and monitoring.   Afebrile WBC trending up; 22.1 today Daptomycin MIC 0.5 susceptible  Permcath placed today by vascular  Patient has declined TEE  Date HD (Y/N) Level Plan  8/24 CRRT  Vanc 1750 mg  8/25 CRRT  Vanc 1000 mg  8/26 N 22   8/27 N  Vanc 750 mg  8/28 N    8/29 Y 22 Vanc 1000 mg  8/30 N    8/31 Y  Vanc 1000 mg  9/1 N    9/3 Y  Vanc 1000mg   9/5   19   9/6 N     Plan: 9/5 Vancomycin random level = 19 Patient has not gone for dialysis since level so do not expect this to have changed significantly  Will schedule daptomycin 500 mg with dialysis TTS to start tomorrow  Baseline CK 16 - will monitor at least weekly while on daptomycin (pt on atorvastatin 40 mg daily) Pharmacy will continue to monitor and dose adjust appropriately   Height: 5\' 4"  (162.6 cm) Weight: 91.2 kg (201 lb) IBW/kg (Calculated) : 54.7  Temp (24hrs), Avg:97.8 F (36.6 C), Min:97 F (36.1 C), Max:98.3 F (36.8 C)  Recent Labs  Lab 12/15/22 0521 12/16/22 0531 12/16/22 1208 12/16/22 2231 12/17/22 0637 12/18/22 0449 12/19/22 1224  WBC 22.6* 18.2*  --   --  19.1* 19.6* 22.1*  CREATININE 2.40* 2.77*  --   --  1.83* 2.18* 2.65*  LATICACIDVEN  --   --  1.4 1.5  --   --   --   VANCORANDOM  --   --   --   --   --  19  --     Estimated Creatinine Clearance: 20.4 mL/min (A) (by C-G formula based on SCr of 2.65 mg/dL (H)).    No Known Allergies  Antimicrobials this admission: 8/23 Azithromycin / CRO x 1 8/24 Cefepime >> 8/26 8/24 Vancomycin >> 9/3 9/6 Daptomycin >>  Microbiology results: 8/23 BCx: 4/4 MRSA 8/23 Cath tip: MRSA 8/24 MRSA PCR: Positive  8/25 BCx: NGTD - final   Thank you for allowing pharmacy to be a part of this  patient's care.  Littie Deeds, PharmD PGY1 Pharmacy Resident 12/19/2022 2:04 PM

## 2022-12-19 NOTE — Op Note (Signed)
OPERATIVE NOTE    PRE-OPERATIVE DIAGNOSIS: 1. ESRD   POST-OPERATIVE DIAGNOSIS: same as above  PROCEDURE: Ultrasound guidance for vascular access to the left internal jugular vein Fluoroscopic guidance for placement of catheter Placement of a 23 cm tip to cuff tunneled hemodialysis catheter via the left internal jugular vein  SURGEON: Festus Barren, MD  ANESTHESIA:  Local with Moderate conscious sedation for approximately 20 minutes using 2 mg of Versed   ESTIMATED BLOOD LOSS: 5 cc  FLUORO TIME: less than one minute  CONTRAST: none  FINDING(S): 1.  Patent left internal jugular vein  SPECIMEN(S):  None  INDICATIONS:   Alexandra Foster is a 72 y.o. female who presents with renal failure and recent permcath infection requiring removal of her right internal jugular permcath.  The patient needs long term dialysis access for their ESRD, and a Permcath is necessary.  Risks and benefits are discussed and informed consent is obtained.    DESCRIPTION: After obtaining full informed written consent, the patient was brought back to the vascular suited. The patient's left neck and chest were sterilely prepped and draped in a sterile surgical field was created. Moderate conscious sedation was administered during a face to face encounter with the patient throughout the procedure with my supervision of the RN administering medicines and monitoring the patient's vital signs, pulse oximetry, telemetry and mental status throughout from the start of the procedure until the patient was taken to the recovery room.  The left internal jugular vein was visualized with ultrasound and found to be patent. It was then accessed under direct ultrasound guidance and a permanent image was recorded. A wire was placed. After skin nick and dilatation, the peel-away sheath was placed over the wire. I then turned my attention to an area under the clavicle. Approximately 1-2 fingerbreadths below the clavicle a small  counterincision was created and tunneled from the subclavicular incision to the access site. Using fluoroscopic guidance, a 23 centimeter tip to cuff tunneled hemodialysis catheter was selected, and tunneled from the subclavicular incision to the access site. It was then placed through the peel-away sheath and the peel-away sheath was removed. Using fluoroscopic guidance the catheter tips were parked in the right atrium. The appropriate distal connectors were placed. It withdrew blood well and flushed easily with heparinized saline and a concentrated heparin solution was then placed. It was secured to the chest wall with 2 Prolene sutures. The access incision was closed single 4-0 Monocryl. A 4-0 Monocryl pursestring suture was placed around the exit site. Sterile dressings were placed. The patient tolerated the procedure well and was taken to the recovery room in stable condition.  COMPLICATIONS: None  CONDITION: Stable  Festus Barren  12/19/2022, 9:34 AM   This note was created with Dragon Medical transcription system. Any errors in dictation are purely unintentional.

## 2022-12-20 DIAGNOSIS — A419 Sepsis, unspecified organism: Secondary | ICD-10-CM | POA: Diagnosis not present

## 2022-12-20 DIAGNOSIS — G9341 Metabolic encephalopathy: Secondary | ICD-10-CM | POA: Diagnosis not present

## 2022-12-20 DIAGNOSIS — R6521 Severe sepsis with septic shock: Secondary | ICD-10-CM | POA: Diagnosis not present

## 2022-12-20 DIAGNOSIS — N186 End stage renal disease: Secondary | ICD-10-CM | POA: Diagnosis not present

## 2022-12-20 LAB — GLUCOSE, CAPILLARY
Glucose-Capillary: 126 mg/dL — ABNORMAL HIGH (ref 70–99)
Glucose-Capillary: 121 mg/dL — ABNORMAL HIGH (ref 70–99)
Glucose-Capillary: 174 mg/dL — ABNORMAL HIGH (ref 70–99)
Glucose-Capillary: 105 mg/dL — ABNORMAL HIGH (ref 70–99)
Glucose-Capillary: 127 mg/dL — ABNORMAL HIGH (ref 70–99)

## 2022-12-20 LAB — RENAL FUNCTION PANEL
Albumin: 2.5 g/dL — ABNORMAL LOW (ref 3.5–5.0)
Anion gap: 8 (ref 5–15)
BUN: 98 mg/dL — ABNORMAL HIGH (ref 8–23)
CO2: 28 mmol/L (ref 22–32)
Calcium: 9.1 mg/dL (ref 8.9–10.3)
Chloride: 106 mmol/L (ref 98–111)
Creatinine, Ser: 2.65 mg/dL — ABNORMAL HIGH (ref 0.44–1.00)
GFR, Estimated: 18 mL/min — ABNORMAL LOW (ref >60)
Glucose, Bld: 141 mg/dL — ABNORMAL HIGH (ref 70–99)
Phosphorus: 3.9 mg/dL (ref 2.5–4.6)
Potassium: 3.7 mmol/L (ref 3.5–5.1)
Sodium: 142 mmol/L (ref 135–145)

## 2022-12-20 LAB — CBC
HCT: 30.5 % — ABNORMAL LOW (ref 36.0–46.0)
Hemoglobin: 9.2 g/dL — ABNORMAL LOW (ref 12.0–15.0)
MCH: 27.1 pg (ref 26.0–34.0)
MCHC: 30.2 g/dL (ref 30.0–36.0)
MCV: 89.7 fL (ref 80.0–100.0)
Platelets: 489 10*3/uL — ABNORMAL HIGH (ref 150–400)
RBC: 3.4 MIL/uL — ABNORMAL LOW (ref 3.87–5.11)
RDW: 22.7 % — ABNORMAL HIGH (ref 11.5–15.5)
WBC: 18 10*3/uL — ABNORMAL HIGH (ref 4.0–10.5)
nRBC: 0.4 % — ABNORMAL HIGH (ref 0.0–0.2)

## 2022-12-20 MED ORDER — ALBUMIN HUMAN 25 % IV SOLN
25.0000 g | Freq: Once | INTRAVENOUS | Status: AC
  Administered 2022-12-21: 25 g via INTRAVENOUS
  Filled 2022-12-20: qty 100

## 2022-12-20 MED ORDER — SODIUM CHLORIDE 0.9 % IV SOLN
500.0000 mg | INTRAVENOUS | Status: DC | PRN
  Administered 2022-12-21: 500 mg via INTRAVENOUS
  Filled 2022-12-20 (×2): qty 10

## 2022-12-20 MED ORDER — ALBUMIN HUMAN 25 % IV SOLN: 25.0000 g | Freq: Once | INTRAVENOUS | Status: DC

## 2022-12-20 MED ORDER — HYDROCORTISONE 1 % EX CREA
TOPICAL_CREAM | Freq: Three times a day (TID) | CUTANEOUS | Status: DC
  Filled 2022-12-20 (×3): qty 28

## 2022-12-20 MED ORDER — DIPHENHYDRAMINE HCL 25 MG PO CAPS
25.0000 mg | ORAL_CAPSULE | Freq: Four times a day (QID) | ORAL | Status: DC | PRN
  Administered 2022-12-21: 25 mg via ORAL
  Filled 2022-12-20 (×2): qty 1

## 2022-12-20 NOTE — Progress Notes (Signed)
Report called to [facility], receiving care nurse for room 148.

## 2022-12-20 NOTE — Progress Notes (Signed)
PHARMACY CONSULT NOTE  Pharmacy Consult for Electrolyte Monitoring and Replacement   Recent Labs: Potassium (mmol/L)  Date Value  12/20/2022 3.7  08/11/2013 3.3 (L)   Magnesium (mg/dL)  Date Value  62/95/2841 2.0   Calcium (mg/dL)  Date Value  32/44/0102 9.1   Calcium, Total (mg/dL)  Date Value  72/53/6644 9.2   Albumin (g/dL)  Date Value  03/47/4259 2.5 (L)  08/11/2013 3.1 (L)   Phosphorus (mg/dL)  Date Value  56/38/7564 3.9   Sodium (mmol/L)  Date Value  12/20/2022 142  08/11/2013 138    Assessment: 72 y.o. female with medical history significant for asthma, COPD, type diabetes mellitus, s/p G-tube, GERD, ESRD on HD, hypertension, osteoarthritis, paroxysmal atrial fibrillation and OSA on CPAP, who presented to the emergency room with acute onset of altered mental status with lethargy and decreased responsiveness, started on CRRT. On Amiodarone. Plan for albumin IV x 1 9/7.   CRRT stopped 8/25. line holiday. CVC placed 8/29 and HD performed same day. Patient is on tube feeds.  Last HD 12/16/2022  Nutrition: Nepro Carb Steady @ 65 mL/hr + 30 mL of free water q4h  Pharmacy has been consulted to monitor and replace electrolytes.  Goal of Therapy:  Electrolytes WNL  Plan:  No replacement needed.  F/u with AM labs.   Paschal Dopp, PharmD, BCPS

## 2022-12-20 NOTE — Progress Notes (Addendum)
[facility]  ROUNDING NOTE   Subjective:    Resting quietly in ICU No family present Denies pain or discomfort Continues to complain of itchy skin  Nocturnal Tube feeds via G tube at 46ml/hr    Objective:  Vital signs in last 24 hours:  Temp:  [98.3 F (36.8 C)] 98.3 F (36.8 C) (09/06 1200) Pulse Rate:  [56-76] 65 (09/07 1100) Resp:  [15-23] 23 (09/07 1000) BP: (95-152)/(46-110) 103/50 (09/07 1100) SpO2:  [95 %-100 %] 98 % (09/07 1100)  Weight change:  Filed Weights   12/16/22 1145 12/16/22 1517 12/18/22 0500  Weight: 89.8 kg 89.8 kg 91.2 kg    Intake/Output: I/O last 3 completed shifts: In: 464.8 [Other:90; NG/GT:374.8] Out: -    Intake/Output this shift:  Total I/O In: 449.7 [NG/GT:449.7] Out: -   Physical Exam: General: Critically ill  Head: Normocephalic, atraumatic. Moist oral mucosal membranes  Eyes: Anicteric  Lungs:  Diminished bilaterally   Heart: regular   Abdomen:  Soft, nontender  Extremities:  1+ peripheral edema.  Neurologic: Alert to self and place,    Access:  Right femoral temp HD catheter 8/29.  LT permcath placed on 12/19/22    Basic Metabolic Panel: Recent Labs  Lab 12/14/22 0440 12/15/22 0521 12/16/22 0531 12/17/22 0637 12/18/22 0449 12/19/22 1224 12/20/22 0740  NA 136 137 136 132* 136 137 142  K 3.6 3.4* 3.4* 3.2* 3.5 3.7 3.7  CL 98 101 99 99 100 101 106  CO2 27 29 27 27 27 27 28   GLUCOSE 160* 159* 148* 127* 112* 129* 141*  BUN 43* 61* 75* 51* 72* 81* 98*  CREATININE 1.72* 2.40* 2.77* 1.83* 2.18* 2.65* 2.65*  CALCIUM 8.2* 8.1* 8.5* 8.1* 8.7* 9.2 9.1  MG 1.5* 2.7*  --   --  1.9 2.0  --   PHOS 2.3* 3.9 4.1 2.7 3.0 4.3 3.9    Liver Function Tests: Recent Labs  Lab 12/16/22 0531 12/17/22 0637 12/18/22 0449 12/19/22 1224 12/20/22 0740  ALBUMIN 2.5* 2.4* 2.8* 2.6* 2.5*   No results for input(s): "LIPASE", "AMYLASE" in the last 168 hours. No results for input(s): "AMMONIA" in the last 168  hours.  CBC: Recent Labs  Lab 12/16/22 0531 12/17/22 0637 12/18/22 0449 12/19/22 1224 12/20/22 0740  WBC 18.2* 19.1* 19.6* 22.1* 18.0*  HGB 7.1* 9.2* 8.8* 9.1* 9.2*  HCT 24.0* 28.7* 28.1* 29.7* 30.5*  MCV 92.0 85.2 88.9 88.4 89.7  PLT 385 357 363 461* 489*    Cardiac Enzymes: Recent Labs  Lab 12/19/22 1224  CKTOTAL 16*    BNP: Invalid input(s): "POCBNP"  CBG: Recent Labs  Lab 12/19/22 1645 12/19/22 1952 12/19/22 2337 12/20/22 0412 12/20/22 0840  GLUCAP 102* 121* 117* 126* 121*    Microbiology: Results for orders placed or performed during the hospital encounter of 12/05/22  Culture, blood (Routine x 2)     Status: Abnormal   Collection Time: 12/05/22  8:56 PM   Specimen: BLOOD  Result Value Ref Range Status   Specimen Description   Final    BLOOD BLOOD RIGHT ARM Performed at [facility], [address] [place], [place] [address]    Special Requests   Final    BOTTLES DRAWN AEROBIC AND ANAEROBIC Blood Culture adequate volume Performed at [facility], [address] [place], [place] [address]    Culture  Setup Time   Final    GRAM POSITIVE COCCI IN BOTH AEROBIC AND ANAEROBIC BOTTLES CRITICAL RESULT CALLED TO, READ  BACK BY AND VERIFIED WITH: SHEEMA HALLLAJI AT 1206 12/06/22.PMF Performed at [facility], [address] [place], [place] [address]    Culture (A)  Final    STAPHYLOCOCCUS AUREUS SUSCEPTIBILITIES PERFORMED ON PREVIOUS CULTURE WITHIN THE LAST 5 DAYS. Performed at [facility], [address], [place], [place] [address]    Report Status 12/08/2022 FINAL  Final  Culture, blood (Routine x 2)     Status: Abnormal   Collection Time: 12/05/22  8:57 PM   Specimen: BLOOD  Result Value Ref Range Status   Specimen Description   Final    BLOOD BLOOD LEFT ARM Performed at [facility], [address] [place], [place] [address]    Special Requests   Final    BOTTLES DRAWN AEROBIC AND ANAEROBIC Blood  Culture adequate volume Performed at [facility], [address] [place], [place] [address]    Culture  Setup Time   Final    GRAM POSITIVE COCCI IN BOTH AEROBIC AND ANAEROBIC BOTTLES CRITICAL RESULT CALLED TO, READ BACK BY AND VERIFIED WITH: SHEEMA HALLAJI AT 1206 12/06/22.PMF Performed at [facility], [address] [place], [place] [address]    Culture METHICILLIN RESISTANT STAPHYLOCOCCUS AUREUS (A)  Final   Report Status 12/08/2022 FINAL  Final   Organism ID, Bacteria METHICILLIN RESISTANT STAPHYLOCOCCUS AUREUS  Final      Susceptibility   Methicillin resistant staphylococcus aureus - MIC*    CIPROFLOXACIN >=8 RESISTANT Resistant     ERYTHROMYCIN >=8 RESISTANT Resistant     GENTAMICIN <=0.5 SENSITIVE Sensitive     OXACILLIN >=4 RESISTANT Resistant     TETRACYCLINE <=1 SENSITIVE Sensitive     VANCOMYCIN <=0.5 SENSITIVE Sensitive     TRIMETH/SULFA >=320 RESISTANT Resistant     CLINDAMYCIN <=0.25 SENSITIVE Sensitive     RIFAMPIN <=0.5 SENSITIVE Sensitive     Inducible Clindamycin NEGATIVE Sensitive     LINEZOLID 2 SENSITIVE Sensitive     * METHICILLIN RESISTANT STAPHYLOCOCCUS AUREUS  Blood Culture ID Panel (Reflexed)     Status: Abnormal   Collection Time: 12/05/22  8:57 PM  Result Value Ref Range Status   Enterococcus faecalis NOT DETECTED NOT DETECTED Final   Enterococcus Faecium NOT DETECTED NOT DETECTED Final   Listeria monocytogenes NOT DETECTED NOT DETECTED Final   Staphylococcus species DETECTED (A) NOT DETECTED Final    Comment: CRITICAL RESULT CALLED TO, READ BACK BY AND VERIFIED WITH: SHEEMA HALLAJI AT 1206 12/06/22.PMF    Staphylococcus aureus (BCID) DETECTED (A) NOT DETECTED Final    Comment: Methicillin (oxacillin)-resistant Staphylococcus aureus (MRSA). MRSA is predictably resistant to beta-lactam antibiotics (except ceftaroline). Preferred therapy is vancomycin unless clinically contraindicated. Patient requires contact precautions if   hospitalized. CRITICAL RESULT CALLED TO, READ BACK BY AND VERIFIED WITH: SHEEMA HALLAJI AT 1206 12/06/22.PMF    Staphylococcus epidermidis DETECTED (A) NOT DETECTED Final    Comment: CRITICAL RESULT CALLED TO, READ BACK BY AND VERIFIED WITH: SHEEMA HALLAJI AT 1206 12/06/22.PMF    Staphylococcus lugdunensis NOT DETECTED NOT DETECTED Final   Streptococcus species NOT DETECTED NOT DETECTED Final   Streptococcus agalactiae NOT DETECTED NOT DETECTED Final   Streptococcus pneumoniae NOT DETECTED NOT DETECTED Final   Streptococcus pyogenes NOT DETECTED NOT DETECTED Final   A.calcoaceticus-baumannii NOT DETECTED NOT DETECTED Final   Bacteroides fragilis NOT DETECTED NOT DETECTED Final   Enterobacterales NOT DETECTED NOT DETECTED Final   Enterobacter cloacae complex NOT DETECTED NOT DETECTED Final   Escherichia coli NOT  DETECTED NOT DETECTED Final   Klebsiella aerogenes NOT DETECTED NOT DETECTED Final   Klebsiella oxytoca NOT DETECTED NOT DETECTED Final   Klebsiella pneumoniae NOT DETECTED NOT DETECTED Final   Proteus species NOT DETECTED NOT DETECTED Final   Salmonella species NOT DETECTED NOT DETECTED Final   Serratia marcescens NOT DETECTED NOT DETECTED Final   Haemophilus influenzae NOT DETECTED NOT DETECTED Final   Neisseria meningitidis NOT DETECTED NOT DETECTED Final   Pseudomonas aeruginosa NOT DETECTED NOT DETECTED Final   Stenotrophomonas maltophilia NOT DETECTED NOT DETECTED Final   Candida albicans NOT DETECTED NOT DETECTED Final   Candida auris NOT DETECTED NOT DETECTED Final   Candida glabrata NOT DETECTED NOT DETECTED Final   Candida krusei NOT DETECTED NOT DETECTED Final   Candida parapsilosis NOT DETECTED NOT DETECTED Final   Candida tropicalis NOT DETECTED NOT DETECTED Final   Cryptococcus neoformans/gattii NOT DETECTED NOT DETECTED Final   Methicillin resistance mecA/C DETECTED (A) NOT DETECTED Final    Comment: CRITICAL RESULT CALLED TO, READ BACK BY AND VERIFIED  WITH: SHEEMA HALLAJI AT 1206 12/06/22.PMF    Meth resistant mecA/C and MREJ DETECTED (A) NOT DETECTED Final    Comment: CRITICAL RESULT CALLED TO, READ BACK BY AND VERIFIED WITH: SHEEMA HALLAJI AT 1206 12/06/22.PMF Performed at [facility], [address] [place], [place] [address]   Resp panel by RT-PCR (RSV, Flu A&B, Covid) Anterior Nasal Swab     Status: None   Collection Time: 12/05/22  9:53 PM   Specimen: Anterior Nasal Swab  Result Value Ref Range Status   SARS Coronavirus 2 by RT PCR NEGATIVE NEGATIVE Final    Comment: (NOTE) SARS-CoV-2 target nucleic acids are NOT DETECTED.  The SARS-CoV-2 RNA is generally detectable in upper respiratory specimens during the acute phase of infection. The lowest concentration of SARS-CoV-2 viral copies this assay can detect is 138 copies/mL. A negative result does not preclude SARS-Cov-2 infection and should not be used as the sole basis for treatment or other patient management decisions. A negative result may occur with  improper specimen collection/handling, submission of specimen other than nasopharyngeal swab, presence of viral mutation(s) within the areas targeted by this assay, and inadequate number of viral copies(<138 copies/mL). A negative result must be combined with clinical observations, patient history, and epidemiological information. The expected result is Negative.  Fact Sheet for Patients:  BloggerCourse.com  Fact Sheet for Healthcare Providers:  SeriousBroker.it  This test is no t yet approved or cleared by the [place] FDA and  has been authorized for detection and/or diagnosis of SARS-CoV-2 by FDA under an Emergency Use Authorization (EUA). This EUA will remain  in effect (meaning this test can be used) for the duration of the COVID-19 declaration under Section 564(b)(1) of the Act, 21 U.S.C.section 360bbb-3(b)(1), unless the authorization is terminated   or revoked sooner.       Influenza A by PCR NEGATIVE NEGATIVE Final   Influenza B by PCR NEGATIVE NEGATIVE Final    Comment: (NOTE) The Xpert Xpress SARS-CoV-2/FLU/RSV plus assay is intended as an aid in the diagnosis of influenza from Nasopharyngeal swab specimens and should not be used as a sole basis for treatment. Nasal washings and aspirates are unacceptable for Xpert Xpress SARS-CoV-2/FLU/RSV testing.  Fact Sheet for Patients: BloggerCourse.com  Fact Sheet for Healthcare Providers: SeriousBroker.it  This test is not yet approved or cleared by the [place] FDA and has been authorized for detection and/or diagnosis of SARS-CoV-2 by FDA under an  Emergency Use Authorization (EUA). This EUA will remain in effect (meaning this test can be used) for the duration of the COVID-19 declaration under Section 564(b)(1) of the Act, 21 U.S.C. section 360bbb-3(b)(1), unless the authorization is terminated or revoked.     Resp Syncytial Virus by PCR NEGATIVE NEGATIVE Final    Comment: (NOTE) Fact Sheet for Patients: BloggerCourse.com  Fact Sheet for Healthcare Providers: SeriousBroker.it  This test is not yet approved or cleared by the [place] FDA and has been authorized for detection and/or diagnosis of SARS-CoV-2 by FDA under an Emergency Use Authorization (EUA). This EUA will remain in effect (meaning this test can be used) for the duration of the COVID-19 declaration under Section 564(b)(1) of the Act, 21 U.S.C. section 360bbb-3(b)(1), unless the authorization is terminated or revoked.  Performed at [facility], [address] [place], [place] [address]   MRSA Next Gen by PCR, Nasal     Status: Abnormal   Collection Time: 12/06/22  4:27 AM   Specimen: Nasal Mucosa; Nasal Swab  Result Value Ref Range Status   MRSA by PCR Next Gen DETECTED (A) NOT  DETECTED Final    Comment: RESULT CALLED TO, READ BACK BY AND VERIFIED WITH: KRISTINE CHAMBERS AT 1914 12/06/22.PMF (NOTE) The GeneXpert MRSA Assay (FDA approved for NASAL specimens only), is one component of a comprehensive MRSA colonization surveillance program. It is not intended to diagnose MRSA infection nor to guide or monitor treatment for MRSA infections. Test performance is not FDA approved in patients less than 25 years old. Performed at [facility], [address] [place], [place] [address]   Cath Tip Culture     Status: Abnormal   Collection Time: 12/06/22  2:22 PM   Specimen: Catheter Tip; Other  Result Value Ref Range Status   Specimen Description   Final    CATH TIP Performed at [facility], [address] [place], [place] [address]    Special Requests   Final    NONE Performed at [facility], [address] [place], [place] [address]    Culture (A)  Final    >=100,000 COLONIES/mL METHICILLIN RESISTANT STAPHYLOCOCCUS AUREUS   Report Status 12/08/2022 FINAL  Final   Organism ID, Bacteria METHICILLIN RESISTANT STAPHYLOCOCCUS AUREUS (A)  Final      Susceptibility   Methicillin resistant staphylococcus aureus - MIC*    CIPROFLOXACIN >=8 RESISTANT Resistant     ERYTHROMYCIN >=8 RESISTANT Resistant     GENTAMICIN <=0.5 SENSITIVE Sensitive     OXACILLIN >=4 RESISTANT Resistant     TETRACYCLINE <=1 SENSITIVE Sensitive     VANCOMYCIN <=0.5 SENSITIVE Sensitive     TRIMETH/SULFA >=320 RESISTANT Resistant     CLINDAMYCIN <=0.25 SENSITIVE Sensitive     RIFAMPIN <=0.5 SENSITIVE Sensitive     Inducible Clindamycin NEGATIVE Sensitive     LINEZOLID 2 SENSITIVE Sensitive     * >=100,000 COLONIES/mL METHICILLIN RESISTANT STAPHYLOCOCCUS AUREUS  Urine Culture (for pregnant, neutropenic or urologic patients or patients with an indwelling urinary catheter)     Status: Abnormal   Collection Time: 12/06/22  5:41 PM   Specimen: Urine, Clean Catch   Result Value Ref Range Status   Specimen Description   Final    URINE, CLEAN CATCH Performed at [facility], [address], [place], [place] [address]    Special Requests   Final    Normal Performed at [facility], [address] [place], [place] [address]  Culture (A)  Final    70,000 COLONIES/mL ENTEROCOCCUS FAECALIS 70,000 COLONIES/mL YEAST    Report Status 12/09/2022 FINAL  Final   Organism ID, Bacteria ENTEROCOCCUS FAECALIS (A)  Final      Susceptibility   Enterococcus faecalis - MIC*    AMPICILLIN <=2 SENSITIVE Sensitive     NITROFURANTOIN <=16 SENSITIVE Sensitive     VANCOMYCIN 1 SENSITIVE Sensitive     * 70,000 COLONIES/mL ENTEROCOCCUS FAECALIS  Culture, blood (Routine X 2) w Reflex to ID Panel     Status: None   Collection Time: 12/07/22  1:31 PM   Specimen: BLOOD  Result Value Ref Range Status   Specimen Description BLOOD BLOOD RIGHT HAND  Final   Special Requests   Final    BOTTLES DRAWN AEROBIC ONLY Blood Culture adequate volume   Culture   Final    NO GROWTH 5 DAYS Performed at [facility], [address] [place], [place] [address]    Report Status 12/12/2022 FINAL  Final  Culture, blood (Routine X 2) w Reflex to ID Panel     Status: None   Collection Time: 12/07/22  6:41 PM   Specimen: BLOOD  Result Value Ref Range Status   Specimen Description BLOOD BLOOD RIGHT HAND  Final   Special Requests   Final    BOTTLES DRAWN AEROBIC AND ANAEROBIC Blood Culture results may not be optimal due to an inadequate volume of blood received in culture bottles   Culture   Final    NO GROWTH 5 DAYS Performed at [facility], [address], [place], [place] [address]    Report Status 12/12/2022 FINAL  Final  C Difficile Quick Screen (NO PCR Reflex)     Status: None   Collection Time: 12/12/22  4:07 PM   Specimen: STOOL  Result Value Ref Range Status   C Diff antigen NEGATIVE NEGATIVE Final   C Diff toxin NEGATIVE NEGATIVE  Final   C Diff interpretation No C. difficile detected.  Final    Comment: Performed at [facility], [address] [place], [place] [address]  Gastrointestinal Panel by PCR , Stool     Status: None   Collection Time: 12/13/22  8:38 AM   Specimen: Stool  Result Value Ref Range Status   Campylobacter species NOT DETECTED NOT DETECTED Final   Plesimonas shigelloides NOT DETECTED NOT DETECTED Final   Salmonella species NOT DETECTED NOT DETECTED Final   Yersinia enterocolitica NOT DETECTED NOT DETECTED Final   Vibrio species NOT DETECTED NOT DETECTED Final   Vibrio cholerae NOT DETECTED NOT DETECTED Final   Enteroaggregative E coli (EAEC) NOT DETECTED NOT DETECTED Final   Enteropathogenic E coli (EPEC) NOT DETECTED NOT DETECTED Final   Enterotoxigenic E coli (ETEC) NOT DETECTED NOT DETECTED Final   Shiga like toxin producing E coli (STEC) NOT DETECTED NOT DETECTED Final   Shigella/Enteroinvasive E coli (EIEC) NOT DETECTED NOT DETECTED Final   Cryptosporidium NOT DETECTED NOT DETECTED Final   Cyclospora cayetanensis NOT DETECTED NOT DETECTED Final   Entamoeba histolytica NOT DETECTED NOT DETECTED Final   Giardia lamblia NOT DETECTED NOT DETECTED Final   Adenovirus F40/41 NOT DETECTED NOT DETECTED Final   Astrovirus NOT DETECTED NOT DETECTED Final   Norovirus GI/GII NOT DETECTED NOT DETECTED Final   Rotavirus A NOT DETECTED NOT DETECTED Final   Sapovirus (I, II, IV, and V) NOT DETECTED NOT DETECTED Final    Comment: Performed at [facility] [address] [place], [place] [address]    Coagulation Studies: No results for input(s): "LABPROT", "INR" in the last 72 hours.   Urinalysis: No results for input(s): "COLORURINE", "LABSPEC", "PHURINE", "GLUCOSEU", "HGBUR", "BILIRUBINUR", "KETONESUR", "PROTEINUR", "UROBILINOGEN", "NITRITE", "LEUKOCYTESUR" in the last 72 hours.  Invalid input(s): "APPERANCEUR"     Imaging: PERIPHERAL VASCULAR CATHETERIZATION  Result  Date: 12/19/2022 See surgical note for result.    Medications:    sodium chloride Stopped (12/15/22 0035)   sodium chloride Stopped (12/08/22 1823)   albumin human     anticoagulant sodium citrate     DAPTOmycin (CUBICIN) 500 mg in sodium chloride 0.9 % IVPB      sodium chloride   Intravenous Once   amiodarone  200 mg Per Tube BID   ascorbic acid  500 mg Per Tube BID   atorvastatin  40 mg Per Tube Daily   chlorhexidine  15 mL Mouth Rinse BID   Chlorhexidine Gluconate Cloth  6 each Topical Q0600   vitamin D3  2,000 Units Per Tube Daily   copper  2 mg Per Tube Daily   droxidopa  300 mg Per Tube TID WC   epoetin (EPOGEN/PROCRIT) injection  10,000 Units Intravenous Q T,Th,Sa-HD   escitalopram  10 mg Per Tube Daily   famotidine  10 mg Per Tube Daily   feeding supplement (NEPRO CARB STEADY)  1,000 mL Per Tube Q24H   feeding supplement (PROSource TF20)  60 mL Per Tube Daily   fludrocortisone  0.2 mg Per Tube Daily   free water  30 mL Per Tube Q4H   heparin injection (subcutaneous)  5,000 Units Subcutaneous Q8H   hydrocortisone cream   Topical TID   hydrOXYzine  50 mg Per Tube Q6H   insulin aspart  0-6 Units Subcutaneous Q4H   midodrine  20 mg Per Tube TID WC   multivitamin  1 tablet Per Tube QHS   mupirocin ointment   Nasal BID   nutrition supplement (JUVEN)  1 packet Per Tube BID BM   mouth rinse  15 mL Mouth Rinse 4 times per day   phenylephrine  10 mg Per J Tube BID   sodium chloride, acetaminophen **OR** acetaminophen, alteplase, alteplase, anticoagulant sodium citrate, artificial tears, clonazePAM, diphenhydrAMINE, fentaNYL (SUBLIMAZE) injection, heparin, ipratropium-albuterol, lidocaine (PF), lidocaine-prilocaine, loperamide HCl, magnesium hydroxide, ondansetron **OR** ondansetron (ZOFRAN) IV, mouth rinse, pentafluoroprop-tetrafluoroeth, sennosides, traZODone  Assessment/ Plan:  [name] is a 72 y.o.  female with past medical history of diabetes, COPD, GERD,  hypertension. OA, a fib, and end stage renal disease on hemodialysis. Patient presents to the ED from her nursing facility complaining of altered mental status. She has been admitted for Lower urinary tract infectious disease [N39.0] Altered mental status, unspecified altered mental status type [R41.82] Sepsis due to gram-negative UTI (HCC) [A41.50, N39.0] Sepsis, due to unspecified organism, unspecified whether acute organ dysfunction present (HCC) [A41.9]  [facility] [place]/TTS/Rt Permcath/? 81.3kg  End stage renal disease -Hemodialysis via Lt chest permcath TTS schedule. - Scheduled to receive dialysis later today.  - Will remove rt groin temp cath later today.  -Significant hypoalbuminemia. Will continue Albumin with dialysis.   2. Hypotension due to sepsis. Blood cultures and dialysis cath tip cultures with MRSA, Enterococcus (12/06/2022). Repeat cultures (12/07/2022) with no growth. However now with significant leukocytosis.   - Pressors remain held  -There is a concern about rash.  Continue daptomycin as per ID recommendations.  Suggested a course of 4 weeks starting 8/25 as day #1.  Antibiotics  to be continued till 01/04/2023.   3. Anemia of chronic kidney disease : PRBC transfusion on 8/26, 9/3  - EPO with HD treatments  - Hgb 9.2   4. Secondary Hyperparathyroidism:  with hypophosphatemia.   - Will continue to monitor bone minerals  5.  Hypokalemia  -Potassium remains 3.7      LOS: 14 Emy Angevine 9/7/202411:12 AM

## 2022-12-20 NOTE — Progress Notes (Signed)
Progress Note   Patient: Alexandra Foster ZOX:[id number] DOB: Jul 12, 1950 DOA: 12/05/2022     14 DOS: the patient was seen and examined on 12/20/2022   Brief hospital course: Kathyann Flis is a 72 y.o. female with past medical history significant for asthma, COPD, type diabetes mellitus, s/p G-tube, GERD, ESRD on HD, hypertension, osteoarthritis, paroxysmal atrial fibrillation and OSA on CPAP, who presented to [facility] ED on 12/05/22 from [facility] due to altered mental status with lethargy and decreased responsiveness. The patient's temperature was 102.2 when EMS arrived to this facility. The patient was not responding to questions throughout transport.  Patient had a fever of 101.4 in the emergency room, leukocytosis of 21.4, lactic acid of 2.3. Blood culture on 8/23 positive for MRSA and staph epidermis. 8/23: Presented from [facility] due to AMS, Code Stroke called due to slurred speech and weakness. Deemed not a candidate for thrombolytics due to no clear last known normal time.  Admitted by [facility] with Nephrology consultation for dialysis. 8/24: Rapid response called due to Hypotension prior to/during Hemodialysis.  Transfer to ICU for peripheral vasopressors and initiation of CRRT.  PCCM consulted to assist with vasopressors.  Blood cultures + for MRSA & Staph Epi. ID consulted. Vascular Surgery consulted, permcath removed.  Temporary HD catheter placed. 8/24: this morning with pronounce left facial droop and dysarthria, remains on CRRT on Levo @8  keep MAP>55 12/08/22: patient with multispecies sepsis, removing central line today due to bacteremia. Holding HD as per nephrology team due to shock.  12/09/22-patient with loose watery BM today, she does have skin breakdown over abd pannus.  She had re-initiation of her psychiatric medications today.  S/p pRBC transfusion overnight. Electrolytes are being repleted.  On room air but requires levophed still.  12/10/22- patient laying in bed with  no acute distress.  Levophed weaned to 94mcg/kg/min, afebrile on room air.  Handling NGT feeds well.  Purewick with no UOP this am. 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  12/11/22- patient off levophed , requires HD today, will place catheter per renal team.  Wound care to see for anal wound.  With episode of emesis during HD and concern for possible aspiration. 12/12/22- Back on low dose Levophed, weaning as able. Worsening Leukocytosis, ID plans to check for C.diff.  Ongoing discussions of when to remove newly placed temporary HD catheter. 12/13/22- Remains on levophed gtt despite scheduled midodrine and droxidopa.  HD session completed 12/14/22- No acute events overnight pt remains on levophed gtt.  Received HD. 12/15/22- No acute events overnight, remains on Levophed.  Increase Droxidopa to 300 mg TID. Leukocytosis improving. 12/16/22- No events overnight.  Remains on Levophed (less than yesterday, down to 5 mcg).  Will give 1 unit blood with HD today for Hgb 7.1.  Increase Midodrine to 20 mg TID.  Consult Cardiology for TEE, however pts family declined to proceed with TEE given possible risk associated with procedure  12/17/22: Pt remains on levophed gtt @2mcg /min.  US Venous Bilateral Lower Extremities revealed no evidence of acute or chronic DVT within the right lower extremity. 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     Principal Problem:   Septic shock (HCC) Active Problems:   MRSA bacteremia   Acute  metabolic encephalopathy   Paroxysmal atrial fibrillation (HCC)   Type 2 diabetes mellitus with chronic kidney disease, with long-term current use of insulin (HCC)   ESRD on  hemodialysis (HCC)   Anxiety and depression   Assessment and Plan:  Septic shock with chronic hypotension. Bacteremia secondary to MRSA and staph epidermis. Dialysis catheter infection. Aspiration pneumonia bilateral lower lobes. Enterococcus faecalis UTI. Patient received vasopressors in the hospital, blood pressure is better.  Currently on midodrine, blood pressure is better. Septicemia was secondary to dialysis catheter infection.  Patient has been followed by ID, antibiotics as switched to daptomycin for 4 weeks.  Patient has refused TEE. Condition has been stabilized.  Patient will be transferred to MedSurg.   End-stage renal disease on dialysis. Hyponatremia  Hypokalemia. Patient has been followed by nephrology for scheduled dialysis.  Permacath was placed 9/6.  Patient will be dialyzed today as scheduled.   Acute on chronic anemia. Anemia of end-stage renal disease. Reactive thrombocytosis. Patient initially received blood transfusion, hemoglobin has been stabilized.   COPD. Obstructive sleep apnea on CPAP. Obesity with BMI 34.50. Appear to be stable.   Acute metabolic encephalopathy. Patient has been evaluate by neurology, no stroke.   Type 2 Diabetes. Patient is on every 4 hour sliding scale insulin with tube feeding.  Continue to follow.   Diarrhea. Appears to be secondary to antibiotics, C. difficile toxin negative.  Diarrhea seem to be better.   Severe debility. History of stroke with left-sided weakness. Status post left AKA. Patient is bedbound at baseline, very poor prognosis.  But family is not interested in palliative care versus hospice.       Subjective:  Patient doing better today, she does not voice any complaint.  Physical Exam: Vitals:   12/20/22 1000 12/20/22 1100 12/20/22 1200 12/20/22 1300  BP: 116/61 (!) 103/50 (!) 105/56 (!) 108/55  Pulse: 76 65 66 66  Resp: (!) 23  17 18   Temp:      TempSrc:      SpO2: 97% 98% 97% 98%  Weight:       Height:       General exam: Appears calm and comfortable  Respiratory system: Clear to auscultation. Respiratory effort normal. Cardiovascular system: S1 & S2 heard, RRR. No JVD, murmurs, rubs, gallops or clicks. No pedal edema. Gastrointestinal system: Abdomen is nondistended, soft and nontender. No organomegaly or masses felt. Normal bowel sounds heard. Central nervous system: Alert and oriented x2. No focal neurological deficits. Extremities: Left AKA. Skin: No rashes, lesions or ulcers Psychiatry: Mood & affect appropriate.    Data Reviewed:  Lab results reviewed.  Family Communication: Sister updated at bedside.  Disposition: Status is: Inpatient Remains inpatient appropriate because: Severity of disease, IV treatment.     Time spent: 35 minutes  Author: Marrion Coy, MD 12/20/2022 1:31 PM  For on call review [email or web address].

## 2022-12-21 DIAGNOSIS — R6521 Severe sepsis with septic shock: Secondary | ICD-10-CM | POA: Diagnosis not present

## 2022-12-21 DIAGNOSIS — A419 Sepsis, unspecified organism: Secondary | ICD-10-CM | POA: Diagnosis not present

## 2022-12-21 DIAGNOSIS — N186 End stage renal disease: Secondary | ICD-10-CM | POA: Diagnosis not present

## 2022-12-21 DIAGNOSIS — G9341 Metabolic encephalopathy: Secondary | ICD-10-CM | POA: Diagnosis not present

## 2022-12-21 LAB — CBC
HCT: 28.8 % — ABNORMAL LOW (ref 36.0–46.0)
Hemoglobin: 8.8 g/dL — ABNORMAL LOW (ref 12.0–15.0)
MCH: 27 pg (ref 26.0–34.0)
MCHC: 30.6 g/dL (ref 30.0–36.0)
MCV: 88.3 fL (ref 80.0–100.0)
Platelets: 483 10*3/uL — ABNORMAL HIGH (ref 150–400)
RBC: 3.26 MIL/uL — ABNORMAL LOW (ref 3.87–5.11)
RDW: 22.5 % — ABNORMAL HIGH (ref 11.5–15.5)
WBC: 17 10*3/uL — ABNORMAL HIGH (ref 4.0–10.5)
nRBC: 0.4 % — ABNORMAL HIGH (ref 0.0–0.2)

## 2022-12-21 LAB — GLUCOSE, CAPILLARY
Glucose-Capillary: 100 mg/dL — ABNORMAL HIGH (ref 70–99)
Glucose-Capillary: 119 mg/dL — ABNORMAL HIGH (ref 70–99)
Glucose-Capillary: 132 mg/dL — ABNORMAL HIGH (ref 70–99)
Glucose-Capillary: 136 mg/dL — ABNORMAL HIGH (ref 70–99)
Glucose-Capillary: 147 mg/dL — ABNORMAL HIGH (ref 70–99)

## 2022-12-21 LAB — RENAL FUNCTION PANEL
Albumin: 2.5 g/dL — ABNORMAL LOW (ref 3.5–5.0)
Anion gap: 9 (ref 5–15)
BUN: 114 mg/dL — ABNORMAL HIGH (ref 8–23)
CO2: 28 mmol/L (ref 22–32)
Calcium: 9.2 mg/dL (ref 8.9–10.3)
Chloride: 105 mmol/L (ref 98–111)
Creatinine, Ser: 2.68 mg/dL — ABNORMAL HIGH (ref 0.44–1.00)
GFR, Estimated: 18 mL/min — ABNORMAL LOW (ref >60)
Glucose, Bld: 130 mg/dL — ABNORMAL HIGH (ref 70–99)
Phosphorus: 3.8 mg/dL (ref 2.5–4.6)
Potassium: 3.5 mmol/L (ref 3.5–5.1)
Sodium: 142 mmol/L (ref 135–145)

## 2022-12-21 MED ORDER — ALBUMIN HUMAN 25 % IV SOLN
INTRAVENOUS | Status: AC
  Filled 2022-12-21: qty 50

## 2022-12-21 MED ORDER — HEPARIN SODIUM (PORCINE) 1000 UNIT/ML IJ SOLN
INTRAMUSCULAR | Status: AC
  Filled 2022-12-21: qty 10

## 2022-12-21 MED ORDER — EPOETIN ALFA 10000 UNIT/ML IJ SOLN
INTRAMUSCULAR | Status: AC
  Filled 2022-12-21: qty 1

## 2022-12-21 MED ORDER — SODIUM CHLORIDE 0.9 % IV SOLN
500.0000 mg | Freq: Once | INTRAVENOUS | Status: DC
  Filled 2022-12-21: qty 10

## 2022-12-21 NOTE — Progress Notes (Signed)
  Received patient in bed to unit.   Informed consent signed and in chart.    TX duration: 3 hrs     Hand-off given to patient's nurse.    Access used: L CVC Access issues: High AP, reversed lines 3x, couldn't run higher  than 300 BFR without AP spiking. Vessel may be collapsing. New Cartridge set up required d/t high TMP   Total UF removed: 500 Medication(s) given: Epoetin, Albumin Post HD VS: WNL Post HD weight: UTA     [facility] D[facility] Unit

## 2022-12-21 NOTE — Progress Notes (Signed)
[facility]  ROUNDING NOTE   Subjective:   Patient laying in bed No family present  Tube feeds in place Will receive dialysis later today    Objective:  Vital signs in last 24 hours:  Temp:  [98.2 F (36.8 C)-98.7 F (37.1 C)] 98.7 F (37.1 C) (09/08 0834) Pulse Rate:  [63-74] 74 (09/08 0834) Resp:  [14-21] 14 (09/08 0834) BP: (80-114)/(39-67) 80/39 (09/08 0834) SpO2:  [96 %-98 %] 96 % (09/08 0834)  Weight change:  Filed Weights   12/16/22 1145 12/16/22 1517 12/18/22 0500  Weight: 89.8 kg 89.8 kg 91.2 kg    Intake/Output: I/O last 3 completed shifts: In: 449.7 [NG/GT:449.7] Out: -    Intake/Output this shift:  No intake/output data recorded.  Physical Exam: General: Ill appearing  Head: Normocephalic, atraumatic. Moist oral mucosal membranes  Eyes: Anicteric  Lungs:  Diminished bilaterally   Heart: regular   Abdomen:  Soft, nontender  Extremities:  1+ peripheral edema.  Neurologic: Alert to self and place,    Access:  LT permcath placed on 12/19/22    Basic Metabolic Panel: Recent Labs  Lab 12/15/22 0521 12/16/22 0531 12/17/22 0637 12/18/22 0449 12/19/22 1224 12/20/22 0740 12/21/22 0345  NA 137   < > 132* 136 137 142 142  K 3.4*   < > 3.2* 3.5 3.7 3.7 3.5  CL 101   < > 99 100 101 106 105  CO2 29   < > 27 27 27 28 28   GLUCOSE 159*   < > 127* 112* 129* 141* 130*  BUN 61*   < > 51* 72* 81* 98* 114*  CREATININE 2.40*   < > 1.83* 2.18* 2.65* 2.65* 2.68*  CALCIUM 8.1*   < > 8.1* 8.7* 9.2 9.1 9.2  MG 2.7*  --   --  1.9 2.0  --   --   PHOS 3.9   < > 2.7 3.0 4.3 3.9 3.8   < > = values in this interval not displayed.    Liver Function Tests: Recent Labs  Lab 12/17/22 4098 12/18/22 0449 12/19/22 1224 12/20/22 0740 12/21/22 0345  ALBUMIN 2.4* 2.8* 2.6* 2.5* 2.5*   No results for input(s): "LIPASE", "AMYLASE" in the last 168 hours. No results for input(s): "AMMONIA" in the last 168 hours.  CBC: Recent Labs  Lab 12/17/22 0637  12/18/22 0449 12/19/22 1224 12/20/22 0740 12/21/22 0345  WBC 19.1* 19.6* 22.1* 18.0* 17.0*  HGB 9.2* 8.8* 9.1* 9.2* 8.8*  HCT 28.7* 28.1* 29.7* 30.5* 28.8*  MCV 85.2 88.9 88.4 89.7 88.3  PLT 357 363 461* 489* 483*    Cardiac Enzymes: Recent Labs  Lab 12/19/22 1224  CKTOTAL 16*    BNP: Invalid input(s): "POCBNP"  CBG: Recent Labs  Lab 12/20/22 1554 12/20/22 2103 12/21/22 0004 12/21/22 0529 12/21/22 0849  GLUCAP 105* 127* 119* 136* 132*    Microbiology: Results for orders placed or performed during the hospital encounter of 12/05/22  Culture, blood (Routine x 2)     Status: Abnormal   Collection Time: 12/05/22  8:56 PM   Specimen: BLOOD  Result Value Ref Range Status   Specimen Description   Final    BLOOD BLOOD RIGHT ARM Performed at [facility], [address] [place], [place] [address]    Special Requests   Final    BOTTLES DRAWN AEROBIC AND ANAEROBIC Blood Culture adequate volume Performed at [facility], [address] [place], [place] [address]    Culture  Setup  Time   Final    GRAM POSITIVE COCCI IN BOTH AEROBIC AND ANAEROBIC BOTTLES CRITICAL RESULT CALLED TO, READ BACK BY AND VERIFIED WITH: SHEEMA HALLLAJI AT 1206 12/06/22.PMF Performed at [facility], [address] [place], [place] [address]    Culture (A)  Final    STAPHYLOCOCCUS AUREUS SUSCEPTIBILITIES PERFORMED ON PREVIOUS CULTURE WITHIN THE LAST 5 DAYS. Performed at [facility], [address], [place], [place] [address]    Report Status 12/08/2022 FINAL  Final  Culture, blood (Routine x 2)     Status: Abnormal   Collection Time: 12/05/22  8:57 PM   Specimen: BLOOD  Result Value Ref Range Status   Specimen Description   Final    BLOOD BLOOD LEFT ARM Performed at [facility], [address], [place], [place] [address]    Special Requests   Final    BOTTLES DRAWN AEROBIC AND ANAEROBIC Blood Culture adequate volume Performed at [facility], [address] [place], [place] [address]    Culture  Setup Time   Final    GRAM POSITIVE COCCI IN BOTH AEROBIC AND ANAEROBIC BOTTLES CRITICAL RESULT CALLED TO, READ BACK BY AND VERIFIED WITH: SHEEMA HALLAJI AT 1206 12/06/22.PMF Performed at [facility], [address] [place], [place] [address]    Culture METHICILLIN RESISTANT STAPHYLOCOCCUS AUREUS (A)  Final   Report Status 12/08/2022 FINAL  Final   Organism ID, Bacteria METHICILLIN RESISTANT STAPHYLOCOCCUS AUREUS  Final      Susceptibility   Methicillin resistant staphylococcus aureus - MIC*    CIPROFLOXACIN >=8 RESISTANT Resistant     ERYTHROMYCIN >=8 RESISTANT Resistant     GENTAMICIN <=0.5 SENSITIVE Sensitive     OXACILLIN >=4 RESISTANT Resistant     TETRACYCLINE <=1 SENSITIVE Sensitive     VANCOMYCIN <=0.5 SENSITIVE Sensitive     TRIMETH/SULFA >=320 RESISTANT Resistant     CLINDAMYCIN <=0.25 SENSITIVE Sensitive     RIFAMPIN <=0.5 SENSITIVE Sensitive     Inducible Clindamycin NEGATIVE Sensitive     LINEZOLID 2 SENSITIVE Sensitive     * METHICILLIN RESISTANT STAPHYLOCOCCUS AUREUS  Blood Culture ID Panel (Reflexed)     Status: Abnormal   Collection Time: 12/05/22  8:57 PM  Result Value Ref Range Status   Enterococcus faecalis NOT DETECTED NOT DETECTED Final   Enterococcus Faecium NOT DETECTED NOT DETECTED Final   Listeria monocytogenes NOT DETECTED NOT DETECTED Final   Staphylococcus species DETECTED (A) NOT DETECTED Final    Comment: CRITICAL RESULT CALLED TO, READ BACK BY AND VERIFIED WITH: SHEEMA HALLAJI AT 1206 12/06/22.PMF    Staphylococcus aureus (BCID) DETECTED (A) NOT DETECTED Final    Comment: Methicillin (oxacillin)-resistant Staphylococcus aureus (MRSA). MRSA is predictably resistant to beta-lactam antibiotics (except ceftaroline). Preferred therapy is vancomycin unless clinically contraindicated. Patient requires contact precautions if  hospitalized. CRITICAL RESULT CALLED TO, READ BACK BY  AND VERIFIED WITH: SHEEMA HALLAJI AT 1206 12/06/22.PMF    Staphylococcus epidermidis DETECTED (A) NOT DETECTED Final    Comment: CRITICAL RESULT CALLED TO, READ BACK BY AND VERIFIED WITH: SHEEMA HALLAJI AT 1206 12/06/22.PMF    Staphylococcus lugdunensis NOT DETECTED NOT DETECTED Final   Streptococcus species NOT DETECTED NOT DETECTED Final   Streptococcus agalactiae NOT DETECTED NOT DETECTED Final   Streptococcus pneumoniae NOT DETECTED NOT DETECTED Final   Streptococcus pyogenes NOT DETECTED NOT DETECTED Final   A.calcoaceticus-baumannii NOT DETECTED NOT DETECTED Final   Bacteroides fragilis NOT DETECTED NOT DETECTED Final  Enterobacterales NOT DETECTED NOT DETECTED Final   Enterobacter cloacae complex NOT DETECTED NOT DETECTED Final   Escherichia coli NOT DETECTED NOT DETECTED Final   Klebsiella aerogenes NOT DETECTED NOT DETECTED Final   Klebsiella oxytoca NOT DETECTED NOT DETECTED Final   Klebsiella pneumoniae NOT DETECTED NOT DETECTED Final   Proteus species NOT DETECTED NOT DETECTED Final   Salmonella species NOT DETECTED NOT DETECTED Final   Serratia marcescens NOT DETECTED NOT DETECTED Final   Haemophilus influenzae NOT DETECTED NOT DETECTED Final   Neisseria meningitidis NOT DETECTED NOT DETECTED Final   Pseudomonas aeruginosa NOT DETECTED NOT DETECTED Final   Stenotrophomonas maltophilia NOT DETECTED NOT DETECTED Final   Candida albicans NOT DETECTED NOT DETECTED Final   Candida auris NOT DETECTED NOT DETECTED Final   Candida glabrata NOT DETECTED NOT DETECTED Final   Candida krusei NOT DETECTED NOT DETECTED Final   Candida parapsilosis NOT DETECTED NOT DETECTED Final   Candida tropicalis NOT DETECTED NOT DETECTED Final   Cryptococcus neoformans/gattii NOT DETECTED NOT DETECTED Final   Methicillin resistance mecA/C DETECTED (A) NOT DETECTED Final    Comment: CRITICAL RESULT CALLED TO, READ BACK BY AND VERIFIED WITH: SHEEMA HALLAJI AT 1206 12/06/22.PMF    Meth resistant  mecA/C and MREJ DETECTED (A) NOT DETECTED Final    Comment: CRITICAL RESULT CALLED TO, READ BACK BY AND VERIFIED WITH: SHEEMA HALLAJI AT 1206 12/06/22.PMF Performed at [facility], [address] [place], [place] [address]   Resp panel by RT-PCR (RSV, Flu A&B, Covid) Anterior Nasal Swab     Status: None   Collection Time: 12/05/22  9:53 PM   Specimen: Anterior Nasal Swab  Result Value Ref Range Status   SARS Coronavirus 2 by RT PCR NEGATIVE NEGATIVE Final    Comment: (NOTE) SARS-CoV-2 target nucleic acids are NOT DETECTED.  The SARS-CoV-2 RNA is generally detectable in upper respiratory specimens during the acute phase of infection. The lowest concentration of SARS-CoV-2 viral copies this assay can detect is 138 copies/mL. A negative result does not preclude SARS-Cov-2 infection and should not be used as the sole basis for treatment or other patient management decisions. A negative result may occur with  improper specimen collection/handling, submission of specimen other than nasopharyngeal swab, presence of viral mutation(s) within the areas targeted by this assay, and inadequate number of viral copies(<138 copies/mL). A negative result must be combined with clinical observations, patient history, and epidemiological information. The expected result is Negative.  Fact Sheet for Patients:  BloggerCourse.com  Fact Sheet for Healthcare Providers:  SeriousBroker.it  This test is no t yet approved or cleared by the [place] FDA and  has been authorized for detection and/or diagnosis of SARS-CoV-2 by FDA under an Emergency Use Authorization (EUA). This EUA will remain  in effect (meaning this test can be used) for the duration of the COVID-19 declaration under Section 564(b)(1) of the Act, 21 U.S.C.section 360bbb-3(b)(1), unless the authorization is terminated  or revoked sooner.       Influenza A by PCR NEGATIVE  NEGATIVE Final   Influenza B by PCR NEGATIVE NEGATIVE Final    Comment: (NOTE) The Xpert Xpress SARS-CoV-2/FLU/RSV plus assay is intended as an aid in the diagnosis of influenza from Nasopharyngeal swab specimens and should not be used as a sole basis for treatment. Nasal washings and aspirates are unacceptable for Xpert Xpress SARS-CoV-2/FLU/RSV testing.  Fact Sheet for Patients: BloggerCourse.com  Fact Sheet for Healthcare Providers: SeriousBroker.it  This test is not yet approved  or cleared by the [place] and has been authorized for detection and/or diagnosis of SARS-CoV-2 by FDA under an Emergency Use Authorization (EUA). This EUA will remain in effect (meaning this test can be used) for the duration of the COVID-19 declaration under Section 564(b)(1) of the Act, 21 U.S.C. section 360bbb-3(b)(1), unless the authorization is terminated or revoked.     Resp Syncytial Virus by PCR NEGATIVE NEGATIVE Final    Comment: (NOTE) Fact Sheet for Patients: BloggerCourse.com  Fact Sheet for Healthcare Providers: SeriousBroker.it  This test is not yet approved or cleared by the [place] FDA and has been authorized for detection and/or diagnosis of SARS-CoV-2 by FDA under an Emergency Use Authorization (EUA). This EUA will remain in effect (meaning this test can be used) for the duration of the COVID-19 declaration under Section 564(b)(1) of the Act, 21 U.S.C. section 360bbb-3(b)(1), unless the authorization is terminated or revoked.  Performed at [facility], [address] [place], [place] [address]   MRSA Next Gen by PCR, Nasal     Status: Abnormal   Collection Time: 12/06/22  4:27 AM   Specimen: Nasal Mucosa; Nasal Swab  Result Value Ref Range Status   MRSA by PCR Next Gen DETECTED (A) NOT DETECTED Final    Comment: RESULT CALLED TO, READ BACK BY AND  VERIFIED WITH: KRISTINE CHAMBERS AT 3244 12/06/22.PMF (NOTE) The GeneXpert MRSA Assay (FDA approved for NASAL specimens only), is one component of a comprehensive MRSA colonization surveillance program. It is not intended to diagnose MRSA infection nor to guide or monitor treatment for MRSA infections. Test performance is not FDA approved in patients less than 61 years old. Performed at [facility], [address], [place], [place] [address]   Cath Tip Culture     Status: Abnormal   Collection Time: 12/06/22  2:22 PM   Specimen: Catheter Tip; Other  Result Value Ref Range Status   Specimen Description   Final    CATH TIP Performed at  [facility], [address] [place], [place] [address]    Special Requests   Final    NONE Performed at [facility], [address] [place], [place] [address]    Culture (A)  Final    >=100,000 COLONIES/mL METHICILLIN RESISTANT STAPHYLOCOCCUS AUREUS   Report Status 12/08/2022 FINAL  Final   Organism ID, Bacteria METHICILLIN RESISTANT STAPHYLOCOCCUS AUREUS (A)  Final      Susceptibility   Methicillin resistant staphylococcus aureus - MIC*    CIPROFLOXACIN >=8 RESISTANT Resistant     ERYTHROMYCIN >=8 RESISTANT Resistant     GENTAMICIN <=0.5 SENSITIVE Sensitive     OXACILLIN >=4 RESISTANT Resistant     TETRACYCLINE <=1 SENSITIVE Sensitive     VANCOMYCIN <=0.5 SENSITIVE Sensitive     TRIMETH/SULFA >=320 RESISTANT Resistant     CLINDAMYCIN <=0.25 SENSITIVE Sensitive     RIFAMPIN <=0.5 SENSITIVE Sensitive     Inducible Clindamycin NEGATIVE Sensitive     LINEZOLID 2 SENSITIVE Sensitive     * >=100,000 COLONIES/mL METHICILLIN RESISTANT STAPHYLOCOCCUS AUREUS  Urine Culture (for pregnant, neutropenic or urologic patients or patients with an indwelling urinary catheter)     Status: Abnormal   Collection Time: 12/06/22  5:41 PM   Specimen: Urine, Clean Catch  Result Value Ref Range Status   Specimen Description   Final     URINE, CLEAN CATCH Performed at [facility], [address], [place], [place] [address]    Special Requests  Final    Normal Performed at [facility], [address] [place], [place] [address]    Culture (A)  Final    70,000 COLONIES/mL ENTEROCOCCUS FAECALIS 70,000 COLONIES/mL YEAST    Report Status 12/09/2022 FINAL  Final   Organism ID, Bacteria ENTEROCOCCUS FAECALIS (A)  Final      Susceptibility   Enterococcus faecalis - MIC*    AMPICILLIN <=2 SENSITIVE Sensitive     NITROFURANTOIN <=16 SENSITIVE Sensitive     VANCOMYCIN 1 SENSITIVE Sensitive     * 70,000 COLONIES/mL ENTEROCOCCUS FAECALIS  Culture, blood (Routine X 2) w Reflex to ID Panel     Status: None   Collection Time: 12/07/22  1:31 PM   Specimen: BLOOD  Result Value Ref Range Status   Specimen Description BLOOD BLOOD RIGHT HAND  Final   Special Requests   Final    BOTTLES DRAWN AEROBIC ONLY Blood Culture adequate volume   Culture   Final    NO GROWTH 5 DAYS Performed at [facility], [address] [place], [place] [address]    Report Status 12/12/2022 FINAL  Final  Culture, blood (Routine X 2) w Reflex to ID Panel     Status: None   Collection Time: 12/07/22  6:41 PM   Specimen: BLOOD  Result Value Ref Range Status   Specimen Description BLOOD BLOOD RIGHT HAND  Final   Special Requests   Final    BOTTLES DRAWN AEROBIC AND ANAEROBIC Blood Culture results may not be optimal due to an inadequate volume of blood received in culture bottles   Culture   Final    NO GROWTH 5 DAYS Performed at [facility], [address], [place], [place] [address]    Report Status 12/12/2022 FINAL  Final  C Difficile Quick Screen (NO PCR Reflex)     Status: None   Collection Time: 12/12/22  4:07 PM   Specimen: STOOL  Result Value Ref Range Status   C Diff antigen NEGATIVE NEGATIVE Final   C Diff toxin NEGATIVE NEGATIVE Final   C Diff interpretation No C. difficile detected.  Final     Comment: Performed at [facility], [address] [place], [place] [address]  Gastrointestinal Panel by PCR , Stool     Status: None   Collection Time: 12/13/22  8:38 AM   Specimen: Stool  Result Value Ref Range Status   Campylobacter species NOT DETECTED NOT DETECTED Final   Plesimonas shigelloides NOT DETECTED NOT DETECTED Final   Salmonella species NOT DETECTED NOT DETECTED Final   Yersinia enterocolitica NOT DETECTED NOT DETECTED Final   Vibrio species NOT DETECTED NOT DETECTED Final   Vibrio cholerae NOT DETECTED NOT DETECTED Final   Enteroaggregative E coli (EAEC) NOT DETECTED NOT DETECTED Final   Enteropathogenic E coli (EPEC) NOT DETECTED NOT DETECTED Final   Enterotoxigenic E coli (ETEC) NOT DETECTED NOT DETECTED Final   Shiga like toxin producing E coli (STEC) NOT DETECTED NOT DETECTED Final   Shigella/Enteroinvasive E coli (EIEC) NOT DETECTED NOT DETECTED Final   Cryptosporidium NOT DETECTED NOT DETECTED Final   Cyclospora cayetanensis NOT DETECTED NOT DETECTED Final   Entamoeba histolytica NOT DETECTED NOT DETECTED Final   Giardia lamblia NOT DETECTED NOT DETECTED Final   Adenovirus F40/41 NOT DETECTED NOT DETECTED Final   Astrovirus NOT DETECTED NOT DETECTED Final   Norovirus GI/GII NOT DETECTED NOT DETECTED Final   Rotavirus A NOT DETECTED NOT DETECTED Final   Sapovirus (I, II,  IV, and V) NOT DETECTED NOT DETECTED Final    Comment: Performed at [facility], [address] [place], [place] [address]    Coagulation Studies: No results for input(s): "LABPROT", "INR" in the last 72 hours.   Urinalysis: No results for input(s): "COLORURINE", "LABSPEC", "PHURINE", "GLUCOSEU", "HGBUR", "BILIRUBINUR", "KETONESUR", "PROTEINUR", "UROBILINOGEN", "NITRITE", "LEUKOCYTESUR" in the last 72 hours.  Invalid input(s): "APPERANCEUR"     Imaging: No results found.   Medications:    sodium chloride Stopped (12/15/22 0035)   sodium chloride Stopped  (12/08/22 1823)   albumin human     anticoagulant sodium citrate     DAPTOmycin (CUBICIN) 500 mg in sodium chloride 0.9 % IVPB      sodium chloride   Intravenous Once   amiodarone  200 mg Per Tube BID   ascorbic acid  500 mg Per Tube BID   atorvastatin  40 mg Per Tube Daily   chlorhexidine  15 mL Mouth Rinse BID   Chlorhexidine Gluconate Cloth  6 each Topical Q0600   vitamin D3  2,000 Units Per Tube Daily   copper  2 mg Per Tube Daily   droxidopa  300 mg Per Tube TID WC   epoetin (EPOGEN/PROCRIT) injection  10,000 Units Intravenous Q T,Th,Sa-HD   escitalopram  10 mg Per Tube Daily   famotidine  10 mg Per Tube Daily   feeding supplement (NEPRO CARB STEADY)  1,000 mL Per Tube Q24H   feeding supplement (PROSource TF20)  60 mL Per Tube Daily   fludrocortisone  0.2 mg Per Tube Daily   free water  30 mL Per Tube Q4H   heparin injection (subcutaneous)  5,000 Units Subcutaneous Q8H   hydrocortisone cream   Topical TID   hydrOXYzine  50 mg Per Tube Q6H   insulin aspart  0-6 Units Subcutaneous Q4H   midodrine  20 mg Per Tube TID WC   multivitamin  1 tablet Per Tube QHS   mupirocin ointment   Nasal BID   nutrition supplement (JUVEN)  1 packet Per Tube BID BM   mouth rinse  15 mL Mouth Rinse 4 times per day   phenylephrine  10 mg Per J Tube BID   sodium chloride, acetaminophen **OR** acetaminophen, alteplase, alteplase, anticoagulant sodium citrate, artificial tears, clonazePAM, DAPTOmycin (CUBICIN) 500 mg in sodium chloride 0.9 % IVPB, diphenhydrAMINE, fentaNYL (SUBLIMAZE) injection, heparin, ipratropium-albuterol, lidocaine (PF), lidocaine-prilocaine, loperamide HCl, magnesium hydroxide, ondansetron **OR** ondansetron (ZOFRAN) IV, mouth rinse, pentafluoroprop-tetrafluoroeth, sennosides, traZODone  Assessment/ Plan:  Ms. Kyndra Eick is a 72 y.o.  female with past medical history of diabetes, COPD, GERD, hypertension. OA, a fib, and end stage renal disease on hemodialysis. Patient  presents to the ED from her nursing facility complaining of altered mental status. She has been admitted for Lower urinary tract infectious disease [N39.0] Altered mental status, unspecified altered mental status type [R41.82] Sepsis due to gram-negative UTI (HCC) [A41.50, N39.0] Sepsis, due to unspecified organism, unspecified whether acute organ dysfunction present (HCC) [A41.9]  [facility] [place]/TTS/Rt Permcath/? 81.3kg  End stage renal disease -Hemodialysis via Lt chest permcath TTS schedule. - Dialysis postponed Saturday due to technical concerns. Patient and labs stable - Plan to perform dialysis today. -Significant hypoalbuminemia. Will continue Albumin with dialysis.   2. Hypotension due to sepsis. Blood cultures and dialysis cath tip cultures with MRSA, Enterococcus (12/06/2022). Repeat cultures (12/07/2022) with no growth. However now with significant leukocytosis.   - Received pressor support during this admission.   - Continue daptomycin  as per ID recommendations.  Suggested a course of 4 weeks starting 8/25 as day #1.  Antibiotics to be continued till 01/04/2023.   3. Anemia of chronic kidney disease : PRBC transfusion on 8/26, 9/3  - Continue EPO with HD treatments  - Hgb 8.8   4. Secondary Hyperparathyroidism:  with hypophosphatemia.   - Calcium and phosphorus within desired range.  5.  Hypokalemia  -Potassium 3.5      LOS: 15 Alexandra Foster 9/8/202410:05 AM

## 2022-12-21 NOTE — Plan of Care (Signed)

## 2022-12-21 NOTE — Plan of Care (Signed)

## 2022-12-21 NOTE — Progress Notes (Signed)
Progress Note   Patient: Alexandra Foster:[id number] DOB: 1950-06-28 DOA: 12/05/2022     15 DOS: the patient was seen and examined on 12/21/2022   Brief hospital course: Alexandra Foster is a 72 y.o. female with past medical history significant for asthma, COPD, type diabetes mellitus, s/p G-tube, GERD, ESRD on HD, hypertension, osteoarthritis, paroxysmal atrial fibrillation and OSA on CPAP, who presented to [facility] ED on 12/05/22 from [facility] due to altered mental status with lethargy and decreased responsiveness. The patient's temperature was 102.2 when EMS arrived to this facility. The patient was not responding to questions throughout transport.  Patient had a fever of 101.4 in the emergency room, leukocytosis of 21.4, lactic acid of 2.3. Blood culture on 8/23 positive for MRSA and staph epidermis. 8/23: Presented from [facility] due to AMS, Code Stroke called due to slurred speech and weakness. Deemed not a candidate for thrombolytics due to no clear last known normal time.  Admitted by [facility] with Nephrology consultation for dialysis. 8/24: Rapid response called due to Hypotension prior to/during Hemodialysis.  Transfer to ICU for peripheral vasopressors and initiation of CRRT.  PCCM consulted to assist with vasopressors.  Blood cultures + for MRSA & Staph Epi. ID consulted. Vascular Surgery consulted, permcath removed.  Temporary HD catheter placed. 8/24: this morning with pronounce left facial droop and dysarthria, remains on CRRT on Levo @8  keep MAP>55 12/08/22: patient with multispecies sepsis, removing central line today due to bacteremia. Holding HD as per nephrology team due to shock.  12/09/22-patient with loose watery BM today, she does have skin breakdown over abd pannus.  She had re-initiation of her psychiatric medications today.  S/p pRBC transfusion overnight. Electrolytes are being repleted.  On room air but requires levophed still.  12/10/22- patient laying in bed with  no acute distress.  Levophed weaned to 64mcg/kg/min, afebrile on room air.  Handling NGT feeds well.  Purewick with no UOP this am. 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  12/11/22- patient off levophed , requires HD today, will place catheter per renal team.  Wound care to see for anal wound.  With episode of emesis during HD and concern for possible aspiration. 12/12/22- Back on low dose Levophed, weaning as able. Worsening Leukocytosis, ID plans to check for C.diff.  Ongoing discussions of when to remove newly placed temporary HD catheter. 12/13/22- Remains on levophed gtt despite scheduled midodrine and droxidopa.  HD session completed 12/14/22- No acute events overnight pt remains on levophed gtt.  Received HD. 12/15/22- No acute events overnight, remains on Levophed.  Increase Droxidopa to 300 mg TID. Leukocytosis improving. 12/16/22- No events overnight.  Remains on Levophed (less than yesterday, down to 5 mcg).  Will give 1 unit blood with HD today for Hgb 7.1.  Increase Midodrine to 20 mg TID.  Consult Cardiology for TEE, however pts family declined to proceed with TEE given possible risk associated with procedure  12/17/22: Pt remains on levophed gtt @2mcg /min.  US Venous Bilateral Lower Extremities revealed no evidence of acute or chronic DVT within the right lower extremity. 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     Principal Problem:   Septic shock (HCC) Active Problems:   MRSA bacteremia   Acute  metabolic encephalopathy   Paroxysmal atrial fibrillation (HCC)   Type 2 diabetes mellitus with chronic kidney disease, with long-term current use of insulin (HCC)   ESRD on  hemodialysis (HCC)   Anxiety and depression   Assessment and Plan: Septic shock with chronic hypotension. Bacteremia secondary to MRSA and staph epidermis. Dialysis catheter infection. Aspiration pneumonia bilateral lower lobes. Enterococcus faecalis UTI. Patient received vasopressors in the hospital, blood pressure is better.  Currently on midodrine, blood pressure is better. Septicemia was secondary to dialysis catheter infection.  Patient has been followed by ID, antibiotics as switched to daptomycin for 4 weeks.  Patient has refused TEE. Patient condition still stable, pending discharge to home while family prepare home for patient.  Does not want nursing home placement.   End-stage renal disease on dialysis. Hyponatremia  Hypokalemia. Patient has been followed by nephrology for scheduled dialysis.  Permacath was placed 9/6.  Patient is dialyzed today.   Acute on chronic anemia. Anemia of end-stage renal disease. Reactive thrombocytosis. Patient initially received blood transfusion, hemoglobin has been stabilized.   COPD. Obstructive sleep apnea on CPAP. Obesity with BMI 34.50. Appear to be stable.   Acute metabolic encephalopathy. Patient has been evaluate by neurology, no stroke.   Type 2 Diabetes. Patient is on every 4 hour sliding scale insulin with tube feeding.  Continue to follow.   Diarrhea. Appears to be secondary to antibiotics, C. difficile toxin negative.  Diarrhea seem to be better.   Severe debility. History of stroke with left-sided weakness. Status post left AKA. Patient is bedbound at baseline, very poor prognosis.  But family is not interested in palliative care versus hospice.            Subjective:  Patient doing better, had a bowel movement today, not watery.  Physical Exam: Vitals:   12/20/22 1709 12/20/22 2325 12/21/22 0834 12/21/22 1113  BP: (!) 99/53 114/66 (!) 110/47 (!) 114/51  Pulse: 67 68 74 73  Resp: 16 20 14 14   Temp: 98.2  F (36.8 C) 98.7 F (37.1 C) 98.7 F (37.1 C) 98.3 F (36.8 C)  TempSrc:  Oral    SpO2: 96% 98% 96% 95%  Weight:      Height:       General exam: Appears calm and comfortable  Respiratory system: Clear to auscultation. Respiratory effort normal. Cardiovascular system: S1 & S2 heard, RRR. No JVD, murmurs, rubs, gallops or clicks. No pedal edema. Gastrointestinal system: Abdomen is nondistended, soft and nontender. No organomegaly or masses felt. Normal bowel sounds heard. Central nervous system: Alert and oriented x2. No focal neurological deficits. Extremities: Symmetric 5 x 5 power. Skin: No rashes, lesions or ulcers Psychiatry: Flat affect.  Data Reviewed:  Lab results reviewed.  Family Communication: Daughter updated at the bedside.  Disposition: Status is: Inpatient Remains inpatient appropriate because: Unsafe discharge.     Time spent: 35 minutes  Author: Marrion Coy, MD 12/21/2022 2:06 PM  For on call review [email or web address].

## 2022-12-21 NOTE — Progress Notes (Signed)
PHARMACY CONSULT NOTE  Pharmacy Consult for Electrolyte Monitoring and Replacement   Recent Labs: Potassium (mmol/L)  Date Value  12/21/2022 3.5  08/11/2013 3.3 (L)   Magnesium (mg/dL)  Date Value  72/53/6644 2.0   Calcium (mg/dL)  Date Value  03/47/4259 9.2   Calcium, Total (mg/dL)  Date Value  56/38/7564 9.2   Albumin (g/dL)  Date Value  33/29/5188 2.5 (L)  08/11/2013 3.1 (L)   Phosphorus (mg/dL)  Date Value  41/66/0630 3.8   Sodium (mmol/L)  Date Value  12/21/2022 142  08/11/2013 138    Assessment: 72 y.o. female with medical history significant for asthma, COPD, type diabetes mellitus, s/p G-tube, GERD, ESRD on HD, hypertension, osteoarthritis, paroxysmal atrial fibrillation and OSA on CPAP, who presented to the emergency room with acute onset of altered mental status with lethargy and decreased responsiveness, started on CRRT. On Amiodarone. Plan for albumin IV x 1 9/8.   CRRT stopped 8/25. line holiday. CVC placed 8/29 and HD performed same day. Patient is on tube feeds.  Last HD 12/16/2022  Nutrition: Nepro Carb Steady @ 65 mL/hr + 30 mL of free water q4h  Pharmacy has been consulted to monitor and replace electrolytes.  Goal of Therapy:  Electrolytes WNL  Plan:  No replacement needed.  F/u with AM labs.   Paschal Dopp, PharmD, BCPS

## 2022-12-22 DIAGNOSIS — A419 Sepsis, unspecified organism: Secondary | ICD-10-CM | POA: Diagnosis not present

## 2022-12-22 DIAGNOSIS — N186 End stage renal disease: Secondary | ICD-10-CM | POA: Diagnosis not present

## 2022-12-22 DIAGNOSIS — G9341 Metabolic encephalopathy: Secondary | ICD-10-CM | POA: Diagnosis not present

## 2022-12-22 DIAGNOSIS — R6521 Severe sepsis with septic shock: Secondary | ICD-10-CM | POA: Diagnosis not present

## 2022-12-22 LAB — RENAL FUNCTION PANEL
Albumin: 2.7 g/dL — ABNORMAL LOW (ref 3.5–5.0)
Anion gap: 10 (ref 5–15)
BUN: 59 mg/dL — ABNORMAL HIGH (ref 8–23)
CO2: 29 mmol/L (ref 22–32)
Calcium: 8.7 mg/dL — ABNORMAL LOW (ref 8.9–10.3)
Chloride: 101 mmol/L (ref 98–111)
Creatinine, Ser: 1.71 mg/dL — ABNORMAL HIGH (ref 0.44–1.00)
GFR, Estimated: 31 mL/min — ABNORMAL LOW (ref >60)
Glucose, Bld: 125 mg/dL — ABNORMAL HIGH (ref 70–99)
Phosphorus: 2.5 mg/dL (ref 2.5–4.6)
Potassium: 3.3 mmol/L — ABNORMAL LOW (ref 3.5–5.1)
Sodium: 140 mmol/L (ref 135–145)

## 2022-12-22 LAB — GLUCOSE, CAPILLARY
Glucose-Capillary: 102 mg/dL — ABNORMAL HIGH (ref 70–99)
Glucose-Capillary: 115 mg/dL — ABNORMAL HIGH (ref 70–99)
Glucose-Capillary: 118 mg/dL — ABNORMAL HIGH (ref 70–99)
Glucose-Capillary: 112 mg/dL — ABNORMAL HIGH (ref 70–99)
Glucose-Capillary: 114 mg/dL — ABNORMAL HIGH (ref 70–99)
Glucose-Capillary: 104 mg/dL — ABNORMAL HIGH (ref 70–99)
Glucose-Capillary: 130 mg/dL — ABNORMAL HIGH (ref 70–99)

## 2022-12-22 MED ORDER — SODIUM CHLORIDE 0.9 % IV SOLN
500.0000 mg | Freq: Once | INTRAVENOUS | Status: AC
  Administered 2022-12-22: 500 mg via INTRAVENOUS
  Filled 2022-12-22: qty 10

## 2022-12-22 MED ORDER — POTASSIUM CHLORIDE CRYS ER 20 MEQ PO TBCR
20.0000 meq | EXTENDED_RELEASE_TABLET | Freq: Once | ORAL | Status: AC
  Administered 2022-12-22: 20 meq via ORAL
  Filled 2022-12-22: qty 1

## 2022-12-22 MED ORDER — SODIUM CHLORIDE 0.9 % IV SOLN
500.0000 mg | INTRAVENOUS | Status: DC
  Administered 2022-12-23 – 2023-01-03 (×6): 500 mg via INTRAVENOUS
  Filled 2022-12-22 (×6): qty 10

## 2022-12-22 NOTE — Progress Notes (Signed)
Patient is not able to walk the distance required to go the bathroom, or he/she is unable to safely negotiate stairs required to access the bathroom.  A 3in1 BSC will alleviate this problem  

## 2022-12-22 NOTE — Progress Notes (Signed)
Current IV infiltrated. Ongoing Cubicin IV stopped. IV Team  Consult placed but not available last night. Four failed IV insertion attempts. Dr. Arville Care informed. No new orders noted.

## 2022-12-22 NOTE — Progress Notes (Signed)
Patient has asthma, COPD, type diabetes mellitus, s/p G-tube, GERD, ESRD on HD, hypertension, osteoarthritis, paroxysmal atrial fibrillation and OSA on CPAP,  which requires upper body to be positioned in ways not feasible with a normal bed. Head must be elevated at least 30 or has difficulty breathing.

## 2022-12-22 NOTE — Plan of Care (Signed)
  Problem: Fluid Volume: Goal: Hemodynamic stability will improve Outcome: Progressing   Problem: Clinical Measurements: Goal: Cardiovascular complication will be avoided Outcome: Progressing   Problem: Nutrition: Goal: Adequate nutrition will be maintained Outcome: Progressing   Problem: Elimination: Goal: Will not experience complications related to bowel motility Outcome: Progressing

## 2022-12-22 NOTE — TOC Progression Note (Signed)
Transition of Care [facility]) - Progression Note    Patient Details  Name: Alexandra Foster MRN: [id number] Date of Birth: [birth year]  Transition of Care [facility]) CM/SW Contact  Marlowe Sax, RN Phone Number: 12/22/2022, 4:11 PM  Clinical Narrative:     Spoke to the patient hand her daughter [name] in the room, they stated that the patient will not be returning to [facility] Her daughter will be taking her home and be her caregiver, she will need EMS to go home, She will need [facility] lift, hospital bed, 3 in 1, tube feedings, and a wheelchair, [employer] to arrange delivery for the DME< the patient's daughter stated that they will be moving furniture on Wed when she has stronger people to help her, she stated that she was told the patient would not DC until Monday 9/16 I encouraged her to check on the CAP program, she asked about being paid to be the patient's caregiver, I explained I did not have information around that but CAP program may be able to help with that She will need an air mattress for decubitus and skin breakdown   Expected Discharge Plan: Home w Home Health Services Barriers to Discharge: Continued Medical Work up  Expected Discharge Plan and Services     Post Acute Care Choice: Home Health Living arrangements for the past 2 months: Skilled Nursing Facility                                       Social Determinants of Health (SDOH) Interventions SDOH Screenings   Food Insecurity: Patient Unable To Answer (12/06/2022)  Housing: High Risk (12/06/2022)  Transportation Needs: No Transportation Needs (12/06/2022)  Utilities: Not At Risk (12/06/2022)  Alcohol Screen: Low Risk  (02/11/2021)  Depression (PHQ2-9): Low Risk  (04/30/2021)  Financial Resource Strain: Medium Risk (08/09/2020)  Physical Activity: Inactive (08/09/2020)  Social Connections: Moderately Integrated (08/09/2020)  Stress: No Stress Concern Present (08/09/2020)  Tobacco Use: Medium Risk (12/05/2022)     Readmission Risk Interventions     No data to display

## 2022-12-22 NOTE — Progress Notes (Signed)
Progress Note   Patient: Alexandra Foster EXB:[id number] DOB: Jan 06, 1951 DOA: 12/05/2022     16 DOS: the patient was seen and examined on 12/22/2022   Brief hospital course: Nakenya Meller is a 72 y.o. female with past medical history significant for asthma, COPD, type diabetes mellitus, s/p G-tube, GERD, ESRD on HD, hypertension, osteoarthritis, paroxysmal atrial fibrillation and OSA on CPAP, who presented to [facility] ED on 12/05/22 from [facility] due to altered mental status with lethargy and decreased responsiveness. The patient's temperature was 102.2 when EMS arrived to this facility. The patient was not responding to questions throughout transport.  Patient had a fever of 101.4 in the emergency room, leukocytosis of 21.4, lactic acid of 2.3. Blood culture on 8/23 positive for MRSA and staph epidermis. 8/23: Presented from [facility] due to AMS, Code Stroke called due to slurred speech and weakness. Deemed not a candidate for thrombolytics due to no clear last known normal time.  Admitted by [facility] with Nephrology consultation for dialysis. 8/24: Rapid response called due to Hypotension prior to/during Hemodialysis.  Transfer to ICU for peripheral vasopressors and initiation of CRRT.  PCCM consulted to assist with vasopressors.  Blood cultures + for MRSA & Staph Epi. ID consulted. Vascular Surgery consulted, permcath removed.  Temporary HD catheter placed. 8/24: this morning with pronounce left facial droop and dysarthria, remains on CRRT on Levo @8  keep MAP>55 12/08/22: patient with multispecies sepsis, removing central line today due to bacteremia. Holding HD as per nephrology team due to shock.  12/09/22-patient with loose watery BM today, she does have skin breakdown over abd pannus.  She had re-initiation of her psychiatric medications today.  S/p pRBC transfusion overnight. Electrolytes are being repleted.  On room air but requires levophed still.  12/10/22- patient laying in bed with  no acute distress.  Levophed weaned to 42mcg/kg/min, afebrile on room air.  Handling NGT feeds well.  Purewick with no UOP this am. 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  12/11/22- patient off levophed , requires HD today, will place catheter per renal team.  Wound care to see for anal wound.  With episode of emesis during HD and concern for possible aspiration. 12/12/22- Back on low dose Levophed, weaning as able. Worsening Leukocytosis, ID plans to check for C.diff.  Ongoing discussions of when to remove newly placed temporary HD catheter. 12/13/22- Remains on levophed gtt despite scheduled midodrine and droxidopa.  HD session completed 12/14/22- No acute events overnight pt remains on levophed gtt.  Received HD. 12/15/22- No acute events overnight, remains on Levophed.  Increase Droxidopa to 300 mg TID. Leukocytosis improving. 12/16/22- No events overnight.  Remains on Levophed (less than yesterday, down to 5 mcg).  Will give 1 unit blood with HD today for Hgb 7.1.  Increase Midodrine to 20 mg TID.  Consult Cardiology for TEE, however pts family declined to proceed with TEE given possible risk associated with procedure  12/17/22: Pt remains on levophed gtt @2mcg /min.  US Venous Bilateral Lower Extremities revealed no evidence of acute or chronic DVT within the right lower extremity. 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     Principal Problem:   Septic shock (HCC) Active Problems:   MRSA bacteremia   Acute  metabolic encephalopathy   Paroxysmal atrial fibrillation (HCC)   Type 2 diabetes mellitus with chronic kidney disease, with long-term current use of insulin (HCC)   ESRD on  hemodialysis (HCC)   Anxiety and depression   Assessment and Plan:  Septic shock with chronic hypotension. Bacteremia secondary to MRSA and staph epidermis. Dialysis catheter infection. Aspiration pneumonia bilateral lower lobes. Enterococcus faecalis UTI. Patient received vasopressors in the hospital, blood pressure is better.  Currently on midodrine, blood pressure is better. Septicemia was secondary to dialysis catheter infection.  Patient has been followed by ID, antibiotics as switched to daptomycin for 4 weeks.  Patient has refused TEE. Patient condition still stable, pending discharge to home while family prepare home for patient.  Discussed with patient daughter yesterday, likely discharge before the end of the week.   End-stage renal disease on dialysis. Hyponatremia  Hypokalemia. Patient has been followed by nephrology for scheduled dialysis.  Permacath was placed 9/6.  Currently on scheduled dialysis.  Potassium level, give 20 mEq of oral potassium.   Acute on chronic anemia. Anemia of end-stage renal disease. Reactive thrombocytosis. Patient initially received blood transfusion, hemoglobin has been stabilized.   COPD. Obstructive sleep apnea on CPAP. Obesity with BMI 34.50. Appear to be stable.   Acute metabolic encephalopathy. Patient has been evaluate by neurology, no stroke.   Type 2 Diabetes. Patient is on every 4 hour sliding scale insulin with tube feeding.  Continue to follow.   Diarrhea. Appears to be secondary to antibiotics, C. difficile toxin negative.  Diarrhea seem to be better.   Severe debility. History of stroke with left-sided weakness. Status post left AKA. Patient is bedbound at baseline, very poor prognosis.  But family is not interested in palliative care versus hospice.       Subjective:  Patient does not have any complaint today.  Physical Exam: Vitals:   12/21/22 1830 12/21/22 2013 12/21/22 2329 12/22/22 0829  BP: 118/73 115/60  118/62 (!) 106/46  Pulse: 66 65 66 67  Resp: 19 17 18 20   Temp: 97.8 F (36.6 C) 98.8 F (37.1 C) 98.6 F (37 C) 97.7 F (36.5 C)  TempSrc: Axillary Oral Axillary   SpO2: 97% 98% 98% 97%  Weight:      Height:       General exam: Appears calm and comfortable  Respiratory system: Clear to auscultation. Respiratory effort normal. Cardiovascular system: S1 & S2 heard, RRR. No JVD, murmurs, rubs, gallops or clicks. No pedal edema. Gastrointestinal system: Abdomen is nondistended, soft and nontender. No organomegaly or masses felt. Normal bowel sounds heard. Central nervous system: Alert and oriented x2. No focal neurological deficits. Extremities: Left AKA. Skin: No rashes, lesions or ulcers Psychiatry: Flat affect.  Data Reviewed:  There are no new results to review at this time.  Family Communication: None  Disposition: Status is: Inpatient Remains inpatient appropriate because: Unsafe discharge.     Time spent: 35 minutes  Author: Marrion Coy, MD 12/22/2022 1:34 PM  For on call review [email or web address].

## 2022-12-22 NOTE — Plan of Care (Signed)
  Problem: Clinical Measurements: Goal: Diagnostic test results will improve Outcome: Progressing   Problem: Respiratory: Goal: Ability to maintain adequate ventilation will improve Outcome: Progressing   Problem: Nutrition: Goal: Adequate nutrition will be maintained Outcome: Progressing   Problem: Skin Integrity: Goal: Risk for impaired skin integrity will decrease Outcome: Progressing   Problem: Education: Goal: Ability to describe self-care measures that may prevent or decrease complications (Diabetes Survival Skills Education) will improve Outcome: Progressing   Problem: Coping: Goal: Ability to adjust to condition or change in health will improve Outcome: Progressing

## 2022-12-22 NOTE — Progress Notes (Signed)
Occupational Therapy Treatment Patient Details Name: Alexandra Foster MRN: [id number] DOB: 14-Sep-1950 Today's Date: 12/22/2022   History of present illness 72 y.o female with PMHx significant for ESRD on HD, L AKA admitted with Acute Metabolic Encephalopathy in setting of Severe Sepsis with Septic Shock due to MRSA Bacteremia from infected right chest Permcath, along with Enterococcus Faecalis UTI.   OT comments  Upon entering the room, pt sleeping soundly with daughter present in room. Pt arouses easily when therapist calls her name. AROM/AAROM for B UEs in all planes of movement. Pt does not complain on pain with ROM this session and able to move UEs against gravity but does fatigue quickly. Pt able to apply vaseline to lips with encouragement. OT then applies to pt's B forearms and pt able to utilize L UE to apply on R but needing some hand over hand assistance to utilize R to apply to L. OT assisted further for thoroughness. Pt's daughter reports they are still moving forward with plans to return home and not back to LTC. Equipment recommendations placed to assist with safety in this endeavor. Pt has made progress in regards to B UE strength and decreased pain from initial evaluation.       If plan is discharge home, recommend the following:  Two people to help with walking and/or transfers;A lot of help with bathing/dressing/bathroom;Assistance with cooking/housework;Assist for transportation;Help with stairs or ramp for entrance;Direct supervision/assist for financial management;Direct supervision/assist for medications management   Equipment Recommendations  Hoyer lift;Hospital bed;Other (comment) (hoyer sling)       Precautions / Restrictions Precautions Precautions: Fall              ADL either performed or assessed with clinical judgement   ADL                                         General ADL Comments: Pt applies lip product to lips with encouragement  and use of L hand. Pt utilized L UE and R UE to apply lotion with some additional assistance to cover all surface area well.    Extremity/Trunk Assessment Upper Extremity Assessment Upper Extremity Assessment: Generalized weakness   Lower Extremity Assessment Lower Extremity Assessment: Generalized weakness        Vision Patient Visual Report: No change from baseline            Cognition Arousal: Alert, Lethargic Behavior During Therapy: WFL for tasks assessed/performed Overall Cognitive Status: History of cognitive impairments - at baseline                                 General Comments: Pt is at baseline per daughter.                   Pertinent Vitals/ Pain       Pain Assessment Pain Assessment: Faces Faces Pain Scale: No hurt         Frequency  Min 1X/week        Progress Toward Goals  OT Goals(current goals can now be found in the care plan section)  Progress towards OT goals: Progressing toward goals      AM-PAC OT "6 Clicks" Daily Activity     Outcome Measure   Help from another person eating meals?: Total Help from another person taking care of personal  grooming?: A Little Help from another person toileting, which includes using toliet, bedpan, or urinal?: Total Help from another person bathing (including washing, rinsing, drying)?: Total Help from another person to put on and taking off regular upper body clothing?: Total Help from another person to put on and taking off regular lower body clothing?: Total 6 Click Score: 8    End of Session    OT Visit Diagnosis: Muscle weakness (generalized) (M62.81)   Activity Tolerance Patient tolerated treatment well   Patient Left in bed;with call bell/phone within reach;with family/visitor present   Nurse Communication Mobility status        Time: 1610-9604 OT Time Calculation (min): 11 min  Charges: OT General Charges $OT Visit: 1 Visit OT Treatments $Self Care/Home  Management : 8-22 mins  [name], MS, OTR/L , CBIS ascom [phone]  12/22/22, 4:16 PM

## 2022-12-22 NOTE — Progress Notes (Incomplete)
PHARMACY CONSULT NOTE  Pharmacy Consult for Electrolyte Monitoring and Replacement   Recent Labs: Potassium (mmol/L)  Date Value  12/22/2022 3.3 (L)  08/11/2013 3.3 (L)   Magnesium (mg/dL)  Date Value  16/01/9603 2.0   Calcium (mg/dL)  Date Value  54/12/8117 8.7 (L)   Calcium, Total (mg/dL)  Date Value  14/78/2956 9.2   Albumin (g/dL)  Date Value  21/30/8657 2.7 (L)  08/11/2013 3.1 (L)   Phosphorus (mg/dL)  Date Value  84/69/6295 2.5   Sodium (mmol/L)  Date Value  12/22/2022 140  08/11/2013 138    Assessment: 72 y.o. female with medical history significant for asthma, COPD, type diabetes mellitus, s/p G-tube, GERD, ESRD on HD, hypertension, osteoarthritis, paroxysmal atrial fibrillation and OSA on CPAP, who presented to the emergency room with acute onset of altered mental status with lethargy and decreased responsiveness, started on CRRT.    CRRT stopped 8/25. line holiday. CVC placed 8/29 and HD performed same day. Patient is on tube feeds.  Nutrition: Nepro Carb Steady @ 65 mL/hr + 30 mL of free water q4h   Pharmacy has been consulted to monitor and replace electrolytes.  Goal of Therapy:  Electrolytes WNL  Plan:  20 mEq Kcl per tube x1  F/u with AM labs.  Erasmo Leventhal, PharmD Candidate [employer]

## 2022-12-22 NOTE — Progress Notes (Signed)
[facility]  ROUNDING NOTE   Subjective:   Patient laying quietly, no family present No complaints to offer  Dialysis received yesterday, UF 0.5L  Objective:  Vital signs in last 24 hours:  Temp:  [97.7 F (36.5 C)-98.8 F (37.1 C)] 97.7 F (36.5 C) (09/09 0829) Pulse Rate:  [63-92] 67 (09/09 0829) Resp:  [17-29] 20 (09/09 0829) BP: (89-167)/(46-155) 106/46 (09/09 0829) SpO2:  [94 %-98 %] 97 % (09/09 0829)  Weight change:  Filed Weights   12/16/22 1145 12/16/22 1517 12/18/22 0500  Weight: 89.8 kg 89.8 kg 91.2 kg    Intake/Output: I/O last 3 completed shifts: In: 0  Out: 500 [Other:500]   Intake/Output this shift:  Total I/O In: -  Out: 100 [Urine:100]  Physical Exam: General: Ill appearing  Head: Normocephalic, atraumatic. Moist oral mucosal membranes  Eyes: Anicteric  Lungs:  Diminished bilaterally   Heart: regular   Abdomen:  Soft, nontender  Extremities:  1+ peripheral edema.  Neurologic: Alert to self and place,    Access:  LT permcath placed on 12/19/22    Basic Metabolic Panel: Recent Labs  Lab 12/18/22 0449 12/19/22 1224 12/20/22 0740 12/21/22 0345 12/22/22 0450  NA 136 137 142 142 140  K 3.5 3.7 3.7 3.5 3.3*  CL 100 101 106 105 101  CO2 27 27 28 28 29   GLUCOSE 112* 129* 141* 130* 125*  BUN 72* 81* 98* 114* 59*  CREATININE 2.18* 2.65* 2.65* 2.68* 1.71*  CALCIUM 8.7* 9.2 9.1 9.2 8.7*  MG 1.9 2.0  --   --   --   PHOS 3.0 4.3 3.9 3.8 2.5    Liver Function Tests: Recent Labs  Lab 12/18/22 0449 12/19/22 1224 12/20/22 0740 12/21/22 0345 12/22/22 0450  ALBUMIN 2.8* 2.6* 2.5* 2.5* 2.7*   No results for input(s): "LIPASE", "AMYLASE" in the last 168 hours. No results for input(s): "AMMONIA" in the last 168 hours.  CBC: Recent Labs  Lab 12/17/22 0637 12/18/22 0449 12/19/22 1224 12/20/22 0740 12/21/22 0345  WBC 19.1* 19.6* 22.1* 18.0* 17.0*  HGB 9.2* 8.8* 9.1* 9.2* 8.8*  HCT 28.7* 28.1* 29.7* 30.5* 28.8*  MCV 85.2  88.9 88.4 89.7 88.3  PLT 357 363 461* 489* 483*    Cardiac Enzymes: Recent Labs  Lab 12/19/22 1224  CKTOTAL 16*    BNP: Invalid input(s): "POCBNP"  CBG: Recent Labs  Lab 12/21/22 2111 12/21/22 2334 12/22/22 0400 12/22/22 0836 12/22/22 1232  GLUCAP 102* 100* 115* 118* 114*    Microbiology: Results for orders placed or performed during the hospital encounter of 12/05/22  Culture, blood (Routine x 2)     Status: Abnormal   Collection Time: 12/05/22  8:56 PM   Specimen: BLOOD  Result Value Ref Range Status   Specimen Description   Final    BLOOD BLOOD RIGHT ARM Performed at [facility], [address], [place], [place] [address]    Special Requests   Final    BOTTLES DRAWN AEROBIC AND ANAEROBIC Blood Culture adequate volume Performed at [facility], [address] [place], [place] [address]    Culture  Setup Time   Final    GRAM POSITIVE COCCI IN BOTH AEROBIC AND ANAEROBIC BOTTLES CRITICAL RESULT CALLED TO, READ BACK BY AND VERIFIED WITH: SHEEMA HALLLAJI AT 1206 12/06/22.PMF Performed at [facility], [address] [place], [place] [address]    Culture (A)  Final    STAPHYLOCOCCUS AUREUS SUSCEPTIBILITIES PERFORMED ON PREVIOUS CULTURE WITHIN THE LAST  5 DAYS. Performed at [facility], [address], [place], [place] [address]    Report Status 12/08/2022 FINAL  Final  Culture, blood (Routine x 2)     Status: Abnormal   Collection Time: 12/05/22  8:57 PM   Specimen: BLOOD  Result Value Ref Range Status   Specimen Description   Final    BLOOD BLOOD LEFT ARM Performed at [facility], [address] [place], [place] [address]    Special Requests   Final    BOTTLES DRAWN AEROBIC AND ANAEROBIC Blood Culture adequate volume Performed at [facility], [address] [place], [place] [address]    Culture  Setup Time   Final    GRAM POSITIVE COCCI IN BOTH AEROBIC AND ANAEROBIC BOTTLES CRITICAL RESULT  CALLED TO, READ BACK BY AND VERIFIED WITH: SHEEMA HALLAJI AT 1206 12/06/22.PMF Performed at [facility], [address], [place], [place] [address]    Culture METHICILLIN RESISTANT STAPHYLOCOCCUS AUREUS (A)  Final   Report Status 12/08/2022 FINAL  Final   Organism ID, Bacteria METHICILLIN RESISTANT STAPHYLOCOCCUS AUREUS  Final      Susceptibility   Methicillin resistant staphylococcus aureus - MIC*    CIPROFLOXACIN >=8 RESISTANT Resistant     ERYTHROMYCIN >=8 RESISTANT Resistant     GENTAMICIN <=0.5 SENSITIVE Sensitive     OXACILLIN >=4 RESISTANT Resistant     TETRACYCLINE <=1 SENSITIVE Sensitive     VANCOMYCIN <=0.5 SENSITIVE Sensitive     TRIMETH/SULFA >=320 RESISTANT Resistant     CLINDAMYCIN <=0.25 SENSITIVE Sensitive     RIFAMPIN <=0.5 SENSITIVE Sensitive     Inducible Clindamycin NEGATIVE Sensitive     LINEZOLID 2 SENSITIVE Sensitive     * METHICILLIN RESISTANT STAPHYLOCOCCUS AUREUS  Blood Culture ID Panel (Reflexed)     Status: Abnormal   Collection Time: 12/05/22  8:57 PM  Result Value Ref Range Status   Enterococcus faecalis NOT DETECTED NOT DETECTED Final   Enterococcus Faecium NOT DETECTED NOT DETECTED Final   Listeria monocytogenes NOT DETECTED NOT DETECTED Final   Staphylococcus species DETECTED (A) NOT DETECTED Final    Comment: CRITICAL RESULT CALLED TO, READ BACK BY AND VERIFIED WITH: SHEEMA HALLAJI AT 1206 12/06/22.PMF    Staphylococcus aureus (BCID) DETECTED (A) NOT DETECTED Final    Comment: Methicillin (oxacillin)-resistant Staphylococcus aureus (MRSA). MRSA is predictably resistant to beta-lactam antibiotics (except ceftaroline). Preferred therapy is vancomycin unless clinically contraindicated. Patient requires contact precautions if  hospitalized. CRITICAL RESULT CALLED TO, READ BACK BY AND VERIFIED WITH: SHEEMA HALLAJI AT 1206 12/06/22.PMF    Staphylococcus epidermidis DETECTED (A) NOT DETECTED Final    Comment: CRITICAL RESULT CALLED TO, READ BACK BY  AND VERIFIED WITH: SHEEMA HALLAJI AT 1206 12/06/22.PMF    Staphylococcus lugdunensis NOT DETECTED NOT DETECTED Final   Streptococcus species NOT DETECTED NOT DETECTED Final   Streptococcus agalactiae NOT DETECTED NOT DETECTED Final   Streptococcus pneumoniae NOT DETECTED NOT DETECTED Final   Streptococcus pyogenes NOT DETECTED NOT DETECTED Final   A.calcoaceticus-baumannii NOT DETECTED NOT DETECTED Final   Bacteroides fragilis NOT DETECTED NOT DETECTED Final   Enterobacterales NOT DETECTED NOT DETECTED Final   Enterobacter cloacae complex NOT DETECTED NOT DETECTED Final   Escherichia coli NOT DETECTED NOT DETECTED Final   Klebsiella aerogenes NOT DETECTED NOT DETECTED Final   Klebsiella oxytoca NOT DETECTED NOT DETECTED Final   Klebsiella pneumoniae NOT DETECTED NOT DETECTED Final   Proteus species NOT DETECTED NOT DETECTED Final  Salmonella species NOT DETECTED NOT DETECTED Final   Serratia marcescens NOT DETECTED NOT DETECTED Final   Haemophilus influenzae NOT DETECTED NOT DETECTED Final   Neisseria meningitidis NOT DETECTED NOT DETECTED Final   Pseudomonas aeruginosa NOT DETECTED NOT DETECTED Final   Stenotrophomonas maltophilia NOT DETECTED NOT DETECTED Final   Candida albicans NOT DETECTED NOT DETECTED Final   Candida auris NOT DETECTED NOT DETECTED Final   Candida glabrata NOT DETECTED NOT DETECTED Final   Candida krusei NOT DETECTED NOT DETECTED Final   Candida parapsilosis NOT DETECTED NOT DETECTED Final   Candida tropicalis NOT DETECTED NOT DETECTED Final   Cryptococcus neoformans/gattii NOT DETECTED NOT DETECTED Final   Methicillin resistance mecA/C DETECTED (A) NOT DETECTED Final    Comment: CRITICAL RESULT CALLED TO, READ BACK BY AND VERIFIED WITH: SHEEMA HALLAJI AT 1206 12/06/22.PMF    Meth resistant mecA/C and MREJ DETECTED (A) NOT DETECTED Final    Comment: CRITICAL RESULT CALLED TO, READ BACK BY AND VERIFIED WITH: SHEEMA HALLAJI AT 1206 12/06/22.PMF Performed at  [facility], [address] [place], [place] [address]   Resp panel by RT-PCR (RSV, Flu A&B, Covid) Anterior Nasal Swab     Status: None   Collection Time: 12/05/22  9:53 PM   Specimen: Anterior Nasal Swab  Result Value Ref Range Status   SARS Coronavirus 2 by RT PCR NEGATIVE NEGATIVE Final    Comment: (NOTE) SARS-CoV-2 target nucleic acids are NOT DETECTED.  The SARS-CoV-2 RNA is generally detectable in upper respiratory specimens during the acute phase of infection. The lowest concentration of SARS-CoV-2 viral copies this assay can detect is 138 copies/mL. A negative result does not preclude SARS-Cov-2 infection and should not be used as the sole basis for treatment or other patient management decisions. A negative result may occur with  improper specimen collection/handling, submission of specimen other than nasopharyngeal swab, presence of viral mutation(s) within the areas targeted by this assay, and inadequate number of viral copies(<138 copies/mL). A negative result must be combined with clinical observations, patient history, and epidemiological information. The expected result is Negative.  Fact Sheet for Patients:  BloggerCourse.com  Fact Sheet for Healthcare Providers:  SeriousBroker.it  This test is no t yet approved or cleared by the [place] FDA and  has been authorized for detection and/or diagnosis of SARS-CoV-2 by FDA under an Emergency Use Authorization (EUA). This EUA will remain  in effect (meaning this test can be used) for the duration of the COVID-19 declaration under Section 564(b)(1) of the Act, 21 U.S.C.section 360bbb-3(b)(1), unless the authorization is terminated  or revoked sooner.       Influenza A by PCR NEGATIVE NEGATIVE Final   Influenza B by PCR NEGATIVE NEGATIVE Final    Comment: (NOTE) The Xpert Xpress SARS-CoV-2/FLU/RSV plus assay is intended as an aid in the diagnosis of  influenza from Nasopharyngeal swab specimens and should not be used as a sole basis for treatment. Nasal washings and aspirates are unacceptable for Xpert Xpress SARS-CoV-2/FLU/RSV testing.  Fact Sheet for Patients: BloggerCourse.com  Fact Sheet for Healthcare Providers: SeriousBroker.it  This test is not yet approved or cleared by the [place] FDA and has been authorized for detection and/or diagnosis of SARS-CoV-2 by FDA under an Emergency Use Authorization (EUA). This EUA will remain in effect (meaning this test can be used) for the duration of the COVID-19 declaration under Section 564(b)(1) of the Act, 21 U.S.C. section 360bbb-3(b)(1), unless the authorization is terminated or revoked.  Resp Syncytial Virus by PCR NEGATIVE NEGATIVE Final    Comment: (NOTE) Fact Sheet for Patients: BloggerCourse.com  Fact Sheet for Healthcare Providers: SeriousBroker.it  This test is not yet approved or cleared by the [place] FDA and has been authorized for detection and/or diagnosis of SARS-CoV-2 by FDA under an Emergency Use Authorization (EUA). This EUA will remain in effect (meaning this test can be used) for the duration of the COVID-19 declaration under Section 564(b)(1) of the Act, 21 U.S.C. section 360bbb-3(b)(1), unless the authorization is terminated or revoked.  Performed at [facility], [address] [place], [place] [address]   MRSA Next Gen by PCR, Nasal     Status: Abnormal   Collection Time: 12/06/22  4:27 AM   Specimen: Nasal Mucosa; Nasal Swab  Result Value Ref Range Status   MRSA by PCR Next Gen DETECTED (A) NOT DETECTED Final    Comment: RESULT CALLED TO, READ BACK BY AND VERIFIED WITH: KRISTINE CHAMBERS AT 2841 12/06/22.PMF (NOTE) The GeneXpert MRSA Assay (FDA approved for NASAL specimens only), is one component of a comprehensive MRSA  colonization surveillance program. It is not intended to diagnose MRSA infection nor to guide or monitor treatment for MRSA infections. Test performance is not FDA approved in patients less than 58 years old. Performed at [facility], [address] [place], [place] [address]   Cath Tip Culture     Status: Abnormal   Collection Time: 12/06/22  2:22 PM   Specimen: Catheter Tip; Other  Result Value Ref Range Status   Specimen Description   Final    CATH TIP Performed at [facility], [address] [place], [place] [address]    Special Requests   Final    NONE Performed at [facility], [address] [place], [place] [address]    Culture (A)  Final    >=100,000 COLONIES/mL METHICILLIN RESISTANT STAPHYLOCOCCUS AUREUS   Report Status 12/08/2022 FINAL  Final   Organism ID, Bacteria METHICILLIN RESISTANT STAPHYLOCOCCUS AUREUS (A)  Final      Susceptibility   Methicillin resistant staphylococcus aureus - MIC*    CIPROFLOXACIN >=8 RESISTANT Resistant     ERYTHROMYCIN >=8 RESISTANT Resistant     GENTAMICIN <=0.5 SENSITIVE Sensitive     OXACILLIN >=4 RESISTANT Resistant     TETRACYCLINE <=1 SENSITIVE Sensitive     VANCOMYCIN <=0.5 SENSITIVE Sensitive     TRIMETH/SULFA >=320 RESISTANT Resistant     CLINDAMYCIN <=0.25 SENSITIVE Sensitive     RIFAMPIN <=0.5 SENSITIVE Sensitive     Inducible Clindamycin NEGATIVE Sensitive     LINEZOLID 2 SENSITIVE Sensitive     * >=100,000 COLONIES/mL METHICILLIN RESISTANT STAPHYLOCOCCUS AUREUS  Urine Culture (for pregnant, neutropenic or urologic patients or patients with an indwelling urinary catheter)     Status: Abnormal   Collection Time: 12/06/22  5:41 PM   Specimen: Urine, Clean Catch  Result Value Ref Range Status   Specimen Description   Final    URINE, CLEAN CATCH Performed at [facility], [address] [place], [place] [address]    Special Requests   Final    Normal Performed at [facility], [address] [place], [place] [address]    Culture (A)  Final    70,000 COLONIES/mL ENTEROCOCCUS FAECALIS 70,000 COLONIES/mL YEAST    Report Status 12/09/2022 FINAL  Final   Organism ID, Bacteria ENTEROCOCCUS FAECALIS (A)  Final      Susceptibility   Enterococcus faecalis -  MIC*    AMPICILLIN <=2 SENSITIVE Sensitive     NITROFURANTOIN <=16 SENSITIVE Sensitive     VANCOMYCIN 1 SENSITIVE Sensitive     * 70,000 COLONIES/mL ENTEROCOCCUS FAECALIS  Culture, blood (Routine X 2) w Reflex to ID Panel     Status: None   Collection Time: 12/07/22  1:31 PM   Specimen: BLOOD  Result Value Ref Range Status   Specimen Description BLOOD BLOOD RIGHT HAND  Final   Special Requests   Final    BOTTLES DRAWN AEROBIC ONLY Blood Culture adequate volume   Culture   Final    NO GROWTH 5 DAYS Performed at [facility], [address] [place], [place] [address]    Report Status 12/12/2022 FINAL  Final  Culture, blood (Routine X 2) w Reflex to ID Panel     Status: None   Collection Time: 12/07/22  6:41 PM   Specimen: BLOOD  Result Value Ref Range Status   Specimen Description BLOOD BLOOD RIGHT HAND  Final   Special Requests   Final    BOTTLES DRAWN AEROBIC AND ANAEROBIC Blood Culture results may not be optimal due to an inadequate volume of blood received in culture bottles   Culture   Final    NO GROWTH 5 DAYS Performed at [facility], [address], [place], [place] [address]    Report Status 12/12/2022 FINAL  Final  C Difficile Quick Screen (NO PCR Reflex)     Status: None   Collection Time: 12/12/22  4:07 PM   Specimen: STOOL  Result Value Ref Range Status   C Diff antigen NEGATIVE NEGATIVE Final   C Diff toxin NEGATIVE NEGATIVE Final   C Diff interpretation No C. difficile detected.  Final    Comment: Performed at [facility], [address] [place], [place] [address]  Gastrointestinal Panel by PCR , Stool     Status: None   Collection  Time: 12/13/22  8:38 AM   Specimen: Stool  Result Value Ref Range Status   Campylobacter species NOT DETECTED NOT DETECTED Final   Plesimonas shigelloides NOT DETECTED NOT DETECTED Final   Salmonella species NOT DETECTED NOT DETECTED Final   Yersinia enterocolitica NOT DETECTED NOT DETECTED Final   Vibrio species NOT DETECTED NOT DETECTED Final   Vibrio cholerae NOT DETECTED NOT DETECTED Final   Enteroaggregative E coli (EAEC) NOT DETECTED NOT DETECTED Final   Enteropathogenic E coli (EPEC) NOT DETECTED NOT DETECTED Final   Enterotoxigenic E coli (ETEC) NOT DETECTED NOT DETECTED Final   Shiga like toxin producing E coli (STEC) NOT DETECTED NOT DETECTED Final   Shigella/Enteroinvasive E coli (EIEC) NOT DETECTED NOT DETECTED Final   Cryptosporidium NOT DETECTED NOT DETECTED Final   Cyclospora cayetanensis NOT DETECTED NOT DETECTED Final   Entamoeba histolytica NOT DETECTED NOT DETECTED Final   Giardia lamblia NOT DETECTED NOT DETECTED Final   Adenovirus F40/41 NOT DETECTED NOT DETECTED Final   Astrovirus NOT DETECTED NOT DETECTED Final   Norovirus GI/GII NOT DETECTED NOT DETECTED Final   Rotavirus A NOT DETECTED NOT DETECTED Final   Sapovirus (I, II, IV, and V) NOT DETECTED NOT DETECTED Final    Comment: Performed at [facility], [address] [place], [place] [address]    Coagulation Studies: No results for input(s): "LABPROT", "INR" in the last 72 hours.   Urinalysis: No results for input(s): "COLORURINE", "LABSPEC", "PHURINE", "GLUCOSEU", "HGBUR", "BILIRUBINUR", "KETONESUR", "PROTEINUR", "UROBILINOGEN", "NITRITE", "LEUKOCYTESUR" in the last 72 hours.  Invalid input(s): "APPERANCEUR"     Imaging: No results found.   Medications:    sodium chloride Stopped (12/15/22 0035)   sodium chloride Stopped (12/08/22 1823)   anticoagulant sodium citrate     DAPTOmycin (CUBICIN) 500 mg in sodium chloride 0.9 % IVPB Stopped (12/22/22 0000)    sodium chloride    Intravenous Once   amiodarone  200 mg Per Tube BID   ascorbic acid  500 mg Per Tube BID   atorvastatin  40 mg Per Tube Daily   chlorhexidine  15 mL Mouth Rinse BID   Chlorhexidine Gluconate Cloth  6 each Topical Q0600   vitamin D3  2,000 Units Per Tube Daily   copper  2 mg Per Tube Daily   droxidopa  300 mg Per Tube TID WC   epoetin (EPOGEN/PROCRIT) injection  10,000 Units Intravenous Q T,Th,Sa-HD   escitalopram  10 mg Per Tube Daily   famotidine  10 mg Per Tube Daily   feeding supplement (NEPRO CARB STEADY)  1,000 mL Per Tube Q24H   feeding supplement (PROSource TF20)  60 mL Per Tube Daily   fludrocortisone  0.2 mg Per Tube Daily   free water  30 mL Per Tube Q4H   heparin injection (subcutaneous)  5,000 Units Subcutaneous Q8H   hydrocortisone cream   Topical TID   hydrOXYzine  50 mg Per Tube Q6H   insulin aspart  0-6 Units Subcutaneous Q4H   midodrine  20 mg Per Tube TID WC   multivitamin  1 tablet Per Tube QHS   mupirocin ointment   Nasal BID   nutrition supplement (JUVEN)  1 packet Per Tube BID BM   mouth rinse  15 mL Mouth Rinse 4 times per day   phenylephrine  10 mg Per J Tube BID   sodium chloride, acetaminophen **OR** acetaminophen, alteplase, alteplase, anticoagulant sodium citrate, artificial tears, clonazePAM, DAPTOmycin (CUBICIN) 500 mg in sodium chloride 0.9 % IVPB, diphenhydrAMINE, fentaNYL (SUBLIMAZE) injection, heparin, ipratropium-albuterol, lidocaine (PF), lidocaine-prilocaine, loperamide HCl, magnesium hydroxide, ondansetron **OR** ondansetron (ZOFRAN) IV, mouth rinse, pentafluoroprop-tetrafluoroeth, sennosides, traZODone  Assessment/ Plan:  [name] is a 72 y.o.  female with past medical history of diabetes, COPD, GERD, hypertension. OA, a fib, and end stage renal disease on hemodialysis. Patient presents to the ED from her nursing facility complaining of altered mental status. She has been admitted for Lower urinary tract infectious disease  [N39.0] Altered mental status, unspecified altered mental status type [R41.82] Sepsis due to gram-negative UTI (HCC) [A41.50, N39.0] Sepsis, due to unspecified organism, unspecified whether acute organ dysfunction present (HCC) [A41.9]  [facility]/TTS/Rt Permcath/? 81.3kg  End stage renal disease -Hemodialysis via Lt chest permcath TTS schedule. - Received dialysis yesterday, successful treatment with UF 0.5L achieved.  - Next treatment scheduled for Tuesday.   2. Hypotension due to sepsis. Blood cultures and dialysis cath tip cultures with MRSA, Enterococcus (12/06/2022). Repeat cultures (12/07/2022) with no growth. However now with significant leukocytosis.   - Received pressor support during this admission.   - Continue daptomycin as per ID recommendations.  Suggested a course of 4 weeks starting 8/25 as day #1.  Antibiotics to be continued till 01/04/2023.   3. Anemia of chronic kidney disease : PRBC transfusion on 8/26, 9/3  - Continue EPO with HD treatments  - will recheck labs with dialysis in am   4. Secondary Hyperparathyroidism:  with hypophosphatemia.   - Will continue to monitor bone minerals.   5.  Hypokalemia  -Potassium  3.3      LOS: 16 Alexandra Foster 9/9/20241:41 PM

## 2022-12-22 NOTE — Progress Notes (Signed)
Patient suffers from asthma, COPD, type diabetes mellitus, s/p G-tube, GERD, ESRD on HD, hypertension, osteoarthritis, paroxysmal atrial fibrillation and OSA on CPAP,   which impairs their ability to perform daily activities like Adls in the home.  A walking aide will not resolve issue with performing activities of daily living. A wheelchair will allow patient to safely perform daily activities. Patient can safely propel the wheelchair in the home or has a caregiver who can provide assistance. Length of need 12 months. Accessories: elevating leg rests (ELRs), wheel locks, extensions and anti-tippers.cushion

## 2022-12-23 DIAGNOSIS — E669 Obesity, unspecified: Secondary | ICD-10-CM | POA: Insufficient documentation

## 2022-12-23 DIAGNOSIS — G9341 Metabolic encephalopathy: Secondary | ICD-10-CM | POA: Diagnosis not present

## 2022-12-23 DIAGNOSIS — N186 End stage renal disease: Secondary | ICD-10-CM | POA: Diagnosis not present

## 2022-12-23 DIAGNOSIS — L8990 Pressure ulcer of unspecified site, unspecified stage: Secondary | ICD-10-CM | POA: Insufficient documentation

## 2022-12-23 DIAGNOSIS — A419 Sepsis, unspecified organism: Secondary | ICD-10-CM | POA: Diagnosis not present

## 2022-12-23 DIAGNOSIS — R6521 Severe sepsis with septic shock: Secondary | ICD-10-CM | POA: Diagnosis not present

## 2022-12-23 LAB — CBC
HCT: 30.2 % — ABNORMAL LOW (ref 36.0–46.0)
Hemoglobin: 9 g/dL — ABNORMAL LOW (ref 12.0–15.0)
MCH: 27.2 pg (ref 26.0–34.0)
MCHC: 29.8 g/dL — ABNORMAL LOW (ref 30.0–36.0)
MCV: 91.2 fL (ref 80.0–100.0)
Platelets: 456 10*3/uL — ABNORMAL HIGH (ref 150–400)
RBC: 3.31 MIL/uL — ABNORMAL LOW (ref 3.87–5.11)
RDW: 22.1 % — ABNORMAL HIGH (ref 11.5–15.5)
WBC: 16.9 10*3/uL — ABNORMAL HIGH (ref 4.0–10.5)
nRBC: 0.2 % (ref 0.0–0.2)

## 2022-12-23 LAB — GLUCOSE, CAPILLARY
Glucose-Capillary: 124 mg/dL — ABNORMAL HIGH (ref 70–99)
Glucose-Capillary: 129 mg/dL — ABNORMAL HIGH (ref 70–99)
Glucose-Capillary: 131 mg/dL — ABNORMAL HIGH (ref 70–99)
Glucose-Capillary: 138 mg/dL — ABNORMAL HIGH (ref 70–99)
Glucose-Capillary: 89 mg/dL (ref 70–99)
Glucose-Capillary: 95 mg/dL (ref 70–99)

## 2022-12-23 LAB — RENAL FUNCTION PANEL
Albumin: 2.7 g/dL — ABNORMAL LOW (ref 3.5–5.0)
Anion gap: 8 (ref 5–15)
BUN: 81 mg/dL — ABNORMAL HIGH (ref 8–23)
CO2: 28 mmol/L (ref 22–32)
Calcium: 9.1 mg/dL (ref 8.9–10.3)
Chloride: 103 mmol/L (ref 98–111)
Creatinine, Ser: 2.24 mg/dL — ABNORMAL HIGH (ref 0.44–1.00)
GFR, Estimated: 22 mL/min — ABNORMAL LOW (ref >60)
Glucose, Bld: 166 mg/dL — ABNORMAL HIGH (ref 70–99)
Phosphorus: 3.5 mg/dL (ref 2.5–4.6)
Potassium: 3.2 mmol/L — ABNORMAL LOW (ref 3.5–5.1)
Sodium: 139 mmol/L (ref 135–145)

## 2022-12-23 MED ORDER — EPOETIN ALFA 10000 UNIT/ML IJ SOLN
INTRAMUSCULAR | Status: AC
  Filled 2022-12-23: qty 1

## 2022-12-23 MED ORDER — POTASSIUM CHLORIDE CRYS ER 20 MEQ PO TBCR
40.0000 meq | EXTENDED_RELEASE_TABLET | Freq: Once | ORAL | Status: AC
  Administered 2022-12-23: 40 meq via ORAL
  Filled 2022-12-23: qty 2

## 2022-12-23 MED ORDER — FREE WATER
60.0000 mL | Freq: Every day | Status: DC
  Administered 2022-12-23 – 2022-12-26 (×11): 60 mL

## 2022-12-23 MED ORDER — NEPRO/CARBSTEADY PO LIQD
237.0000 mL | Freq: Every day | ORAL | Status: DC
  Administered 2022-12-23 – 2022-12-25 (×7): 237 mL

## 2022-12-23 MED ORDER — HEPARIN SODIUM (PORCINE) 1000 UNIT/ML IJ SOLN
INTRAMUSCULAR | Status: AC
  Filled 2022-12-23: qty 10

## 2022-12-23 NOTE — TOC Progression Note (Signed)
Transition of Care [facility]) - Progression Note    Patient Details  Name: Alexandra Foster MRN: [id number] Date of Birth: 01-Jan-1951  Transition of [facility]) CM/SW Contact  Marlowe Sax, RN Phone Number: 12/23/2022, 2:39 PM  Clinical Narrative:     Daughter stated that they will not be able to get room set up until Wed to arrange DMD delivery  Expected Discharge Plan: Home w Home Health Services Barriers to Discharge: Continued Medical Work up  Expected Discharge Plan and Services     Post Acute Care Choice: Home Health Living arrangements for the past 2 months: Skilled Nursing Facility                                       Social Determinants of Health (SDOH) Interventions SDOH Screenings   Food Insecurity: Patient Unable To Answer (12/06/2022)  Housing: High Risk (12/06/2022)  Transportation Needs: No Transportation Needs (12/06/2022)  Utilities: Not At Risk (12/06/2022)  Alcohol Screen: Low Risk  (02/11/2021)  Depression (PHQ2-9): Low Risk  (04/30/2021)  Financial Resource Strain: Medium Risk (08/09/2020)  Physical Activity: Inactive (08/09/2020)  Social Connections: Moderately Integrated (08/09/2020)  Stress: No Stress Concern Present (08/09/2020)  Tobacco Use: Medium Risk (12/05/2022)    Readmission Risk Interventions     No data to display

## 2022-12-23 NOTE — Progress Notes (Addendum)
Progress Note   Patient: Alexandra Foster EXB:[id number] DOB: 08-Sep-1950 DOA: 12/05/2022     17 DOS: the patient was seen and examined on 12/23/2022   Brief hospital course: Mavourneen Resende is a 72 y.o. female with past medical history significant for asthma, COPD, type diabetes mellitus, s/p G-tube, GERD, ESRD on HD, hypertension, osteoarthritis, paroxysmal atrial fibrillation and OSA on CPAP, who presented to [facility] ED on 12/05/22 from [facility] due to altered mental status with lethargy and decreased responsiveness. The patient's temperature was 102.2 when EMS arrived to this facility. The patient was not responding to questions throughout transport.  Patient had a fever of 101.4 in the emergency room, leukocytosis of 21.4, lactic acid of 2.3. Blood culture on 8/23 positive for MRSA and staph epidermis. 8/23: Presented from [facility] due to AMS, Code Stroke called due to slurred speech and weakness. Deemed not a candidate for thrombolytics due to no clear last known normal time.  Admitted by [facility] with Nephrology consultation for dialysis. 8/24: Rapid response called due to Hypotension prior to/during Hemodialysis.  Transfer to ICU for peripheral vasopressors and initiation of CRRT.  PCCM consulted to assist with vasopressors.  Blood cultures + for MRSA & Staph Epi. ID consulted. Vascular Surgery consulted, permcath removed.  Temporary HD catheter placed. 8/24: this morning with pronounce left facial droop and dysarthria, remains on CRRT on Levo @8  keep MAP>55 12/08/22: patient with multispecies sepsis, removing central line today due to bacteremia. Holding HD as per nephrology team due to shock.  12/09/22-patient with loose watery BM today, she does have skin breakdown over abd pannus.  She had re-initiation of her psychiatric medications today.  S/p pRBC transfusion overnight. Electrolytes are being repleted.  On room air but requires levophed still.  12/10/22- patient laying in bed with  no acute distress.  Levophed weaned to 42mcg/kg/min, afebrile on room air.  Handling NGT feeds well.  Purewick with no UOP this am. 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  12/11/22- patient off levophed , requires HD today, will place catheter per renal team.  Wound care to see for anal wound.  With episode of emesis during HD and concern for possible aspiration. 12/12/22- Back on low dose Levophed, weaning as able. Worsening Leukocytosis, ID plans to check for C.diff.  Ongoing discussions of when to remove newly placed temporary HD catheter. 12/13/22- Remains on levophed gtt despite scheduled midodrine and droxidopa.  HD session completed 12/14/22- No acute events overnight pt remains on levophed gtt.  Received HD. 12/15/22- No acute events overnight, remains on Levophed.  Increase Droxidopa to 300 mg TID. Leukocytosis improving. 12/16/22- No events overnight.  Remains on Levophed (less than yesterday, down to 5 mcg).  Will give 1 unit blood with HD today for Hgb 7.1.  Increase Midodrine to 20 mg TID.  Consult Cardiology for TEE, however pts family declined to proceed with TEE given possible risk associated with procedure  12/17/22: Pt remains on levophed gtt @2mcg /min.  US Venous Bilateral Lower Extremities revealed no evidence of acute or chronic DVT within the right lower extremity. 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   Patient condition has improved, currently medically stable for discharge.  Family need to prepare home for her before  discharge.  Principal Problem:   Septic shock (HCC) Active Problems:   MRSA bacteremia   Acute metabolic encephalopathy   Paroxysmal atrial  fibrillation (HCC)   Type 2 diabetes mellitus with chronic kidney disease, with long-term current use of insulin (HCC)   ESRD on hemodialysis (HCC)   Anxiety and depression   Assessment and Plan: Septic shock with chronic hypotension. Bacteremia secondary to MRSA and staph epidermis. Dialysis catheter infection. Aspiration pneumonia bilateral lower lobes. Enterococcus faecalis UTI. Patient received vasopressors in the hospital, blood pressure is better.  Currently on midodrine, blood pressure is better. Septicemia was secondary to dialysis catheter infection.  Patient has been followed by ID, antibiotics as switched to daptomycin for 4 weeks, last dose 10/3.  Patient has refused TEE. Patient condition still stable, pending discharge to home while family prepare home for patient.  Discussed with patient daughter yesterday, likely discharge before the end of the week.   End-stage renal disease on dialysis. Hyponatremia  Hypokalemia. Patient has been followed by nephrology for scheduled dialysis.  Permacath was placed 9/6.  Currently on scheduled dialysis.     Acute on chronic anemia. Anemia of end-stage renal disease. Reactive thrombocytosis. Patient initially received blood transfusion, hemoglobin has been stabilized.   COPD. Obstructive sleep apnea on CPAP. Obesity with BMI 34.50. Appear to be stable.   Acute metabolic encephalopathy. Patient has been evaluate by neurology, no stroke.   Type 2 Diabetes. Patient is on every 4 hour sliding scale insulin with tube feeding.  Continue to follow.   Diarrhea. Appears to be secondary to antibiotics, C. difficile toxin negative.  Diarrhea seem to be better.  Tube feeding  Patient is on tube feeding. Changed to bolus feeding per family request. DME home feeding orders signed.  Severe debility. History of stroke with left-sided weakness. Status post left AKA. Patient is bedbound at baseline, very poor prognosis.  But family is not  interested in palliative care versus hospice.      pressure ulcers POA/ Follow by RN Pressure Injury 12/06/22 Buttocks Left Stage 2 -  Partial thickness loss of dermis presenting as a shallow open injury with a red, pink wound bed without slough. (Active)  12/06/22 1800  Location: Buttocks  Location Orientation: Left  Staging: Stage 2 -  Partial thickness loss of dermis presenting as a shallow open injury with a red, pink wound bed without slough.  Wound Description (Comments):   Present on Admission: Yes     Pressure Injury 12/06/22 Buttocks Right Stage 2 -  Partial thickness loss of dermis presenting as a shallow open injury with a red, pink wound bed without slough. (Active)  12/06/22 1800  Location: Buttocks  Location Orientation: Right  Staging: Stage 2 -  Partial thickness loss of dermis presenting as a shallow open injury with a red, pink wound bed without slough.  Wound Description (Comments):   Present on Admission: Yes          Subjective:  Patient doing well today, no complaint.  Physical Exam: Vitals:   12/23/22 1130 12/23/22 1200 12/23/22 1230 12/23/22 1250  BP: 99/61 114/76 (!) 96/58 (!) 104/55  Pulse: 66 80 72 70  Resp: 17 14 18 19   Temp:    98.9 F (37.2 C)  TempSrc:    Axillary  SpO2: 99% 99% 96% 94%  Weight:    84.4 kg  Height:       General exam: Appears calm and comfortable  Respiratory system: Clear to auscultation. Respiratory effort normal.  Cardiovascular system: S1 & S2 heard, RRR. No JVD, murmurs, rubs, gallops or clicks. No pedal edema. Gastrointestinal system: Abdomen is nondistended, soft and nontender. No organomegaly or masses felt. Normal bowel sounds heard. Central nervous system: Alert and oriented x2. No focal neurological deficits. Extremities: Symmetric 5 x 5 power. Skin: No rashes, lesions or ulcers Psychiatry: Flat affect.   Data Reviewed:  Lab results reviewed.  Family Communication: None  Disposition: Status is:  Inpatient Remains inpatient appropriate because: Unsafe discharge.     Time spent: 35 minutes  Author: Marrion Coy, MD 12/23/2022 1:29 PM  For on call review [email or web address].

## 2022-12-23 NOTE — Progress Notes (Signed)
Nutrition Follow-up  DOCUMENTATION CODES:   Obesity unspecified  INTERVENTION:   -Continue 500 mg vitamin C BID -Continue renal MVI daily -Continue 2 mg copper via tube x 30 days -Continue daily weights -Transition to bolus feeds per g-tube per family request:   237 ml Nepro (1 ARC carton) 5 times daily  30 ml free water flush before and after feeding administration  Tube feeding regimen provides 2100 kcal (100% of needs), 95 grams of protein, and 860 ml of H2O. Total free water: 1160 ml daily  NUTRITION DIAGNOSIS:   Inadequate oral intake related to dysphagia as evidenced by NPO status (pt with chronic G-tube).  Ongoing  GOAL:   Patient will meet greater than or equal to 90% of their needs  Met with TF  MONITOR:   Labs, Weight trends, TF tolerance, Skin, I & O's  REASON FOR ASSESSMENT:   Consult Assessment of nutrition requirement/status  ASSESSMENT:   72 y.o. female with a history of asthma/COPD, HTN, DMII, obesity, OSA on CPAP, SDH, GERD, ESRD on HD, afib, anxiety, depression, possible cirrhosis and a lengthy admission (04/2021) for necrotizing fasciitis, bacteremia and sepsos ( requiring L AKA, tracheostomy and G-tube placement) and who is now admitted with acute metabolic encephalopathy in setting of severe sepsis with septic shock due to MRSA & staph epi bacteremia (suspected from right chest permcath) and UTI.  9/3- TEE cancelled per request of family 9/6- permacath placement   Reviewed I/O's: +4.3 L x 24 hours and +17.4 L since 12/09/22  UOP: 350 ml x 24 hours  Per ID notes, plan 4 week treatment of antibiotics for MRSA bacteremia.   Attempted to speak with pt, however, down in HD at time of visit.   Case discussed with MD; pt will be transitioning home with 24/7 care of family, She will not be returning to SNF. Pt requesting to transition to bolus feeds. Spoke with TOC, RN, and MD regarding discharge needs.   Per palliative care, pt desire full scope  treatment. Noted pt with poor prognosis, but does not want hospice or palliative care at this time.   Wt has been stable since admission.  Medications reviewed and include vitamin C, vitamin D3, copper, epogen, and renal MVI.   Labs reviewed: K: 3.2, CBGS: 95-138 (inpatient orders for glycemic control are 0-6 units insulin aspart every 4 hours).    Diet Order:   Diet Order             Diet NPO time specified  Diet effective midnight                   EDUCATION NEEDS:   No education needs have been identified at this time  Skin:  Skin Assessment: Skin Integrity Issues: Skin Integrity Issues:: Stage II Stage II: L/R buttocks  Last BM:  12/22/22 (type 7)  Height:   Ht Readings from Last 1 Encounters:  12/05/22 5\' 4"  (1.626 m)    Weight:   Wt Readings from Last 1 Encounters:  12/23/22 84.4 kg   BMI:  Body mass index is 31.93 kg/m.  Estimated Nutritional Needs:   Kcal:  1800-2100kcal/day  Protein:  90-105g/day  Fluid:  UOP +1L    Levada Schilling, RD, LDN, CDCES Registered Dietitian II Certified Diabetes Care and Education Specialist Please refer to [facility] for RD and/or RD on-call/weekend/after hours pager

## 2022-12-23 NOTE — Plan of Care (Signed)

## 2022-12-23 NOTE — Progress Notes (Signed)
PT Cancellation Note  Patient Details Name: Alexandra Foster MRN: [id number] DOB: [birth year]   Cancelled Treatment:    Reason Eval/Treat Not Completed: Other (comment).  Pt currently off floor at dialysis.  Will re-attempt PT session at a later date/time.  Hendricks Limes, PT 12/23/22, 12:54 PM

## 2022-12-23 NOTE — Plan of Care (Signed)
  Problem: Activity: Goal: Risk for activity intolerance will decrease Outcome: Progressing   Problem: Nutrition: Goal: Adequate nutrition will be maintained Outcome: Progressing   Problem: Coping: Goal: Level of anxiety will decrease Outcome: Progressing   Problem: Coping: Goal: Ability to adjust to condition or change in health will improve Outcome: Progressing

## 2022-12-23 NOTE — Progress Notes (Signed)
[facility]  ROUNDING NOTE   Subjective:   Patient seen and evaluated during dialysis    HEMODIALYSIS FLOWSHEET:  Blood Flow Rate (mL/min): 0 mL/min Arterial Pressure (mmHg): -32.72 mmHg Venous Pressure (mmHg): 46.26 mmHg TMP (mmHg): 1.61 mmHg Ultrafiltration Rate (mL/min): 548 mL/min Dialysate Flow Rate (mL/min): 300 ml/min Dialysis Fluid Bolus: Normal Saline Bolus Amount (mL): 200 mL  Resting quietly during dialysis  Objective:  Vital signs in last 24 hours:  Temp:  [98.1 F (36.7 C)-99.1 F (37.3 C)] 98.9 F (37.2 C) (09/10 1250) Pulse Rate:  [63-80] 70 (09/10 1250) Resp:  [14-34] 19 (09/10 1250) BP: (87-154)/(53-122) 104/55 (09/10 1250) SpO2:  [94 %-99 %] 94 % (09/10 1250) Weight:  [84.4 kg-85 kg] 84.4 kg (09/10 1250)  Weight change:  Filed Weights   12/23/22 0855 12/23/22 1250  Weight: 85 kg 84.4 kg    Intake/Output: I/O last 3 completed shifts: In: 4600 [NG/GT:4550; IV Piggyback:50] Out: 350 [Urine:350]   Intake/Output this shift:  Total I/O In: -  Out: 1450 [Urine:450; Other:1000]  Physical Exam: General: Ill appearing  Head: Normocephalic, atraumatic. Moist oral mucosal membranes  Eyes: Anicteric  Lungs:  Diminished bilaterally   Heart: regular   Abdomen:  Soft, nontender  Extremities:  1+ peripheral edema.  Neurologic: Alert to self and place,    Access:  LT permcath placed on 12/19/22    Basic Metabolic Panel: Recent Labs  Lab 12/18/22 0449 12/19/22 1224 12/20/22 0740 12/21/22 0345 12/22/22 0450 12/23/22 0616  NA 136 137 142 142 140 139  K 3.5 3.7 3.7 3.5 3.3* 3.2*  CL 100 101 106 105 101 103  CO2 27 27 28 28 29 28   GLUCOSE 112* 129* 141* 130* 125* 166*  BUN 72* 81* 98* 114* 59* 81*  CREATININE 2.18* 2.65* 2.65* 2.68* 1.71* 2.24*  CALCIUM 8.7* 9.2 9.1 9.2 8.7* 9.1  MG 1.9 2.0  --   --   --   --   PHOS 3.0 4.3 3.9 3.8 2.5 3.5    Liver Function Tests: Recent Labs  Lab 12/19/22 1224 12/20/22 0740 12/21/22 0345  12/22/22 0450 12/23/22 0616  ALBUMIN 2.6* 2.5* 2.5* 2.7* 2.7*   No results for input(s): "LIPASE", "AMYLASE" in the last 168 hours. No results for input(s): "AMMONIA" in the last 168 hours.  CBC: Recent Labs  Lab 12/18/22 0449 12/19/22 1224 12/20/22 0740 12/21/22 0345 12/23/22 0927  WBC 19.6* 22.1* 18.0* 17.0* 16.9*  HGB 8.8* 9.1* 9.2* 8.8* 9.0*  HCT 28.1* 29.7* 30.5* 28.8* 30.2*  MCV 88.9 88.4 89.7 88.3 91.2  PLT 363 461* 489* 483* 456*    Cardiac Enzymes: Recent Labs  Lab 12/19/22 1224  CKTOTAL 16*    BNP: Invalid input(s): "POCBNP"  CBG: Recent Labs  Lab 12/22/22 1644 12/22/22 2007 12/23/22 0023 12/23/22 0439 12/23/22 0748  GLUCAP 104* 130* 129* 138* 124*    Microbiology: Results for orders placed or performed during the hospital encounter of 12/05/22  Culture, blood (Routine x 2)     Status: Abnormal   Collection Time: 12/05/22  8:56 PM   Specimen: BLOOD  Result Value Ref Range Status   Specimen Description   Final    BLOOD BLOOD RIGHT ARM Performed at [facility], [address] [place], [place] [address]    Special Requests   Final    BOTTLES DRAWN AEROBIC AND ANAEROBIC Blood Culture adequate volume Performed at [facility], [address] [place], [place] [address]    Culture  Setup Time   Final    GRAM POSITIVE COCCI IN BOTH AEROBIC AND ANAEROBIC BOTTLES CRITICAL RESULT CALLED TO, READ BACK BY AND VERIFIED WITH: SHEEMA HALLLAJI AT 1206 12/06/22.PMF Performed at [facility], [address] [place], [place] [address]    Culture (A)  Final    STAPHYLOCOCCUS AUREUS SUSCEPTIBILITIES PERFORMED ON PREVIOUS CULTURE WITHIN THE LAST 5 DAYS. Performed at [facility], [address], [place], [place] [address]    Report Status 12/08/2022 FINAL  Final  Culture, blood (Routine x 2)     Status: Abnormal   Collection Time: 12/05/22  8:57 PM   Specimen: BLOOD  Result Value Ref Range Status   Specimen  Description   Final    BLOOD BLOOD LEFT ARM Performed at [facility], [address] [place], [place] [address]    Special Requests   Final    BOTTLES DRAWN AEROBIC AND ANAEROBIC Blood Culture adequate volume Performed at [facility], [address] [place], [place] [address]    Culture  Setup Time   Final    GRAM POSITIVE COCCI IN BOTH AEROBIC AND ANAEROBIC BOTTLES CRITICAL RESULT CALLED TO, READ BACK BY AND VERIFIED WITH: SHEEMA HALLAJI AT 1206 12/06/22.PMF Performed at [facility], [address] [place], [place] [address]    Culture METHICILLIN RESISTANT STAPHYLOCOCCUS AUREUS (A)  Final   Report Status 12/08/2022 FINAL  Final   Organism ID, Bacteria METHICILLIN RESISTANT STAPHYLOCOCCUS AUREUS  Final      Susceptibility   Methicillin resistant staphylococcus aureus - MIC*    CIPROFLOXACIN >=8 RESISTANT Resistant     ERYTHROMYCIN >=8 RESISTANT Resistant     GENTAMICIN <=0.5 SENSITIVE Sensitive     OXACILLIN >=4 RESISTANT Resistant     TETRACYCLINE <=1 SENSITIVE Sensitive     VANCOMYCIN <=0.5 SENSITIVE Sensitive     TRIMETH/SULFA >=320 RESISTANT Resistant     CLINDAMYCIN <=0.25 SENSITIVE Sensitive     RIFAMPIN <=0.5 SENSITIVE Sensitive     Inducible Clindamycin NEGATIVE Sensitive     LINEZOLID 2 SENSITIVE Sensitive     * METHICILLIN RESISTANT STAPHYLOCOCCUS AUREUS  Blood Culture ID Panel (Reflexed)     Status: Abnormal   Collection Time: 12/05/22  8:57 PM  Result Value Ref Range Status   Enterococcus faecalis NOT DETECTED NOT DETECTED Final   Enterococcus Faecium NOT DETECTED NOT DETECTED Final   Listeria monocytogenes NOT DETECTED NOT DETECTED Final   Staphylococcus species DETECTED (A) NOT DETECTED Final    Comment: CRITICAL RESULT CALLED TO, READ BACK BY AND VERIFIED WITH: SHEEMA HALLAJI AT 1206 12/06/22.PMF    Staphylococcus aureus (BCID) DETECTED (A) NOT DETECTED Final    Comment: Methicillin (oxacillin)-resistant Staphylococcus aureus  (MRSA). MRSA is predictably resistant to beta-lactam antibiotics (except ceftaroline). Preferred therapy is vancomycin unless clinically contraindicated. Patient requires contact precautions if  hospitalized. CRITICAL RESULT CALLED TO, READ BACK BY AND VERIFIED WITH: SHEEMA HALLAJI AT 1206 12/06/22.PMF    Staphylococcus epidermidis DETECTED (A) NOT DETECTED Final    Comment: CRITICAL RESULT CALLED TO, READ BACK BY AND VERIFIED WITH: SHEEMA HALLAJI AT 1206 12/06/22.PMF    Staphylococcus lugdunensis NOT DETECTED NOT DETECTED Final   Streptococcus species NOT DETECTED NOT DETECTED Final   Streptococcus agalactiae NOT DETECTED NOT DETECTED Final   Streptococcus pneumoniae NOT DETECTED NOT DETECTED Final   Streptococcus pyogenes NOT DETECTED NOT DETECTED Final   A.calcoaceticus-baumannii NOT DETECTED NOT DETECTED Final   Bacteroides fragilis NOT DETECTED NOT DETECTED Final  Enterobacterales NOT DETECTED NOT DETECTED Final   Enterobacter cloacae complex NOT DETECTED NOT DETECTED Final   Escherichia coli NOT DETECTED NOT DETECTED Final   Klebsiella aerogenes NOT DETECTED NOT DETECTED Final   Klebsiella oxytoca NOT DETECTED NOT DETECTED Final   Klebsiella pneumoniae NOT DETECTED NOT DETECTED Final   Proteus species NOT DETECTED NOT DETECTED Final   Salmonella species NOT DETECTED NOT DETECTED Final   Serratia marcescens NOT DETECTED NOT DETECTED Final   Haemophilus influenzae NOT DETECTED NOT DETECTED Final   Neisseria meningitidis NOT DETECTED NOT DETECTED Final   Pseudomonas aeruginosa NOT DETECTED NOT DETECTED Final   Stenotrophomonas maltophilia NOT DETECTED NOT DETECTED Final   Candida albicans NOT DETECTED NOT DETECTED Final   Candida auris NOT DETECTED NOT DETECTED Final   Candida glabrata NOT DETECTED NOT DETECTED Final   Candida krusei NOT DETECTED NOT DETECTED Final   Candida parapsilosis NOT DETECTED NOT DETECTED Final   Candida tropicalis NOT DETECTED NOT DETECTED Final    Cryptococcus neoformans/gattii NOT DETECTED NOT DETECTED Final   Methicillin resistance mecA/C DETECTED (A) NOT DETECTED Final    Comment: CRITICAL RESULT CALLED TO, READ BACK BY AND VERIFIED WITH: SHEEMA HALLAJI AT 1206 12/06/22.PMF    Meth resistant mecA/C and MREJ DETECTED (A) NOT DETECTED Final    Comment: CRITICAL RESULT CALLED TO, READ BACK BY AND VERIFIED WITH: SHEEMA HALLAJI AT 1206 12/06/22.PMF Performed at [facility], [address] [place], [place] [address]   Resp panel by RT-PCR (RSV, Flu A&B, Covid) Anterior Nasal Swab     Status: None   Collection Time: 12/05/22  9:53 PM   Specimen: Anterior Nasal Swab  Result Value Ref Range Status   SARS Coronavirus 2 by RT PCR NEGATIVE NEGATIVE Final    Comment: (NOTE) SARS-CoV-2 target nucleic acids are NOT DETECTED.  The SARS-CoV-2 RNA is generally detectable in upper respiratory specimens during the acute phase of infection. The lowest concentration of SARS-CoV-2 viral copies this assay can detect is 138 copies/mL. A negative result does not preclude SARS-Cov-2 infection and should not be used as the sole basis for treatment or other patient management decisions. A negative result may occur with  improper specimen collection/handling, submission of specimen other than nasopharyngeal swab, presence of viral mutation(s) within the areas targeted by this assay, and inadequate number of viral copies(<138 copies/mL). A negative result must be combined with clinical observations, patient history, and epidemiological information. The expected result is Negative.  Fact Sheet for Patients:  BloggerCourse.com  Fact Sheet for Healthcare Providers:  SeriousBroker.it  This test is no t yet approved or cleared by the [place] FDA and  has been authorized for detection and/or diagnosis of SARS-CoV-2 by FDA under an Emergency Use Authorization (EUA). This EUA will remain  in  effect (meaning this test can be used) for the duration of the COVID-19 declaration under Section 564(b)(1) of the Act, 21 U.S.C.section 360bbb-3(b)(1), unless the authorization is terminated  or revoked sooner.       Influenza A by PCR NEGATIVE NEGATIVE Final   Influenza B by PCR NEGATIVE NEGATIVE Final    Comment: (NOTE) The Xpert Xpress SARS-CoV-2/FLU/RSV plus assay is intended as an aid in the diagnosis of influenza from Nasopharyngeal swab specimens and should not be used as a sole basis for treatment. Nasal washings and aspirates are unacceptable for Xpert Xpress SARS-CoV-2/FLU/RSV testing.  Fact Sheet for Patients: BloggerCourse.com  Fact Sheet for Healthcare Providers: SeriousBroker.it  This test is not yet approved  or cleared by the [place] and has been authorized for detection and/or diagnosis of SARS-CoV-2 by FDA under an Emergency Use Authorization (EUA). This EUA will remain in effect (meaning this test can be used) for the duration of the COVID-19 declaration under Section 564(b)(1) of the Act, 21 U.S.C. section 360bbb-3(b)(1), unless the authorization is terminated or revoked.     Resp Syncytial Virus by PCR NEGATIVE NEGATIVE Final    Comment: (NOTE) Fact Sheet for Patients: BloggerCourse.com  Fact Sheet for Healthcare Providers: SeriousBroker.it  This test is not yet approved or cleared by the [place] FDA and has been authorized for detection and/or diagnosis of SARS-CoV-2 by FDA under an Emergency Use Authorization (EUA). This EUA will remain in effect (meaning this test can be used) for the duration of the COVID-19 declaration under Section 564(b)(1) of the Act, 21 U.S.C. section 360bbb-3(b)(1), unless the authorization is terminated or revoked.  Performed at [facility], [address] [place], [place] [address]   MRSA Next  Gen by PCR, Nasal     Status: Abnormal   Collection Time: 12/06/22  4:27 AM   Specimen: Nasal Mucosa; Nasal Swab  Result Value Ref Range Status   MRSA by PCR Next Gen DETECTED (A) NOT DETECTED Final    Comment: RESULT CALLED TO, READ BACK BY AND VERIFIED WITH: KRISTINE CHAMBERS AT 4132 12/06/22.PMF (NOTE) The GeneXpert MRSA Assay (FDA approved for NASAL specimens only), is one component of a comprehensive MRSA colonization surveillance program. It is not intended to diagnose MRSA infection nor to guide or monitor treatment for MRSA infections. Test performance is not FDA approved in patients less than 16 years old. Performed at [facility], [address], [place], [place] [address]   Cath Tip Culture     Status: Abnormal   Collection Time: 12/06/22  2:22 PM   Specimen: Catheter Tip; Other  Result Value Ref Range Status   Specimen Description   Final    CATH TIP Performed at [facility], [address] [place], [place] [address]    Special Requests   Final    NONE Performed at [facility], [address] [place], [place] [address]    Culture (A)  Final    >=100,000 COLONIES/mL METHICILLIN RESISTANT STAPHYLOCOCCUS AUREUS   Report Status 12/08/2022 FINAL  Final   Organism ID, Bacteria METHICILLIN RESISTANT STAPHYLOCOCCUS AUREUS (A)  Final      Susceptibility   Methicillin resistant staphylococcus aureus - MIC*    CIPROFLOXACIN >=8 RESISTANT Resistant     ERYTHROMYCIN >=8 RESISTANT Resistant     GENTAMICIN <=0.5 SENSITIVE Sensitive     OXACILLIN >=4 RESISTANT Resistant     TETRACYCLINE <=1 SENSITIVE Sensitive     VANCOMYCIN <=0.5 SENSITIVE Sensitive     TRIMETH/SULFA >=320 RESISTANT Resistant     CLINDAMYCIN <=0.25 SENSITIVE Sensitive     RIFAMPIN <=0.5 SENSITIVE Sensitive     Inducible Clindamycin NEGATIVE Sensitive     LINEZOLID 2 SENSITIVE Sensitive     * >=100,000 COLONIES/mL METHICILLIN RESISTANT STAPHYLOCOCCUS AUREUS  Urine Culture  (for pregnant, neutropenic or urologic patients or patients with an indwelling urinary catheter)     Status: Abnormal   Collection Time: 12/06/22  5:41 PM   Specimen: Urine, Clean Catch  Result Value Ref Range Status   Specimen Description   Final    URINE, CLEAN CATCH Performed at [facility], [address], [place], [place] [address]    Special Requests  Final    Normal Performed at [facility], [address] [place], [place] [address]    Culture (A)  Final    70,000 COLONIES/mL ENTEROCOCCUS FAECALIS 70,000 COLONIES/mL YEAST    Report Status 12/09/2022 FINAL  Final   Organism ID, Bacteria ENTEROCOCCUS FAECALIS (A)  Final      Susceptibility   Enterococcus faecalis - MIC*    AMPICILLIN <=2 SENSITIVE Sensitive     NITROFURANTOIN <=16 SENSITIVE Sensitive     VANCOMYCIN 1 SENSITIVE Sensitive     * 70,000 COLONIES/mL ENTEROCOCCUS FAECALIS  Culture, blood (Routine X 2) w Reflex to ID Panel     Status: None   Collection Time: 12/07/22  1:31 PM   Specimen: BLOOD  Result Value Ref Range Status   Specimen Description BLOOD BLOOD RIGHT HAND  Final   Special Requests   Final    BOTTLES DRAWN AEROBIC ONLY Blood Culture adequate volume   Culture   Final    NO GROWTH 5 DAYS Performed at [facility], [address] [place], [place] [address]    Report Status 12/12/2022 FINAL  Final  Culture, blood (Routine X 2) w Reflex to ID Panel     Status: None   Collection Time: 12/07/22  6:41 PM   Specimen: BLOOD  Result Value Ref Range Status   Specimen Description BLOOD BLOOD RIGHT HAND  Final   Special Requests   Final    BOTTLES DRAWN AEROBIC AND ANAEROBIC Blood Culture results may not be optimal due to an inadequate volume of blood received in culture bottles   Culture   Final    NO GROWTH 5 DAYS Performed at [facility], [address], [place], [place] [address]    Report Status 12/12/2022 FINAL  Final  C Difficile Quick Screen (NO PCR  Reflex)     Status: None   Collection Time: 12/12/22  4:07 PM   Specimen: STOOL  Result Value Ref Range Status   C Diff antigen NEGATIVE NEGATIVE Final   C Diff toxin NEGATIVE NEGATIVE Final   C Diff interpretation No C. difficile detected.  Final    Comment: Performed at [facility], [address] [place], [place] [address]  Gastrointestinal Panel by PCR , Stool     Status: None   Collection Time: 12/13/22  8:38 AM   Specimen: Stool  Result Value Ref Range Status   Campylobacter species NOT DETECTED NOT DETECTED Final   Plesimonas shigelloides NOT DETECTED NOT DETECTED Final   Salmonella species NOT DETECTED NOT DETECTED Final   Yersinia enterocolitica NOT DETECTED NOT DETECTED Final   Vibrio species NOT DETECTED NOT DETECTED Final   Vibrio cholerae NOT DETECTED NOT DETECTED Final   Enteroaggregative E coli (EAEC) NOT DETECTED NOT DETECTED Final   Enteropathogenic E coli (EPEC) NOT DETECTED NOT DETECTED Final   Enterotoxigenic E coli (ETEC) NOT DETECTED NOT DETECTED Final   Shiga like toxin producing E coli (STEC) NOT DETECTED NOT DETECTED Final   Shigella/Enteroinvasive E coli (EIEC) NOT DETECTED NOT DETECTED Final   Cryptosporidium NOT DETECTED NOT DETECTED Final   Cyclospora cayetanensis NOT DETECTED NOT DETECTED Final   Entamoeba histolytica NOT DETECTED NOT DETECTED Final   Giardia lamblia NOT DETECTED NOT DETECTED Final   Adenovirus F40/41 NOT DETECTED NOT DETECTED Final   Astrovirus NOT DETECTED NOT DETECTED Final   Norovirus GI/GII NOT DETECTED NOT DETECTED Final   Rotavirus A NOT DETECTED NOT DETECTED Final   Sapovirus (I, II,  IV, and V) NOT DETECTED NOT DETECTED Final    Comment: Performed at [facility], [address] [place], [place] [address]    Coagulation Studies: No results for input(s): "LABPROT", "INR" in the last 72 hours.   Urinalysis: No results for input(s): "COLORURINE", "LABSPEC", "PHURINE", "GLUCOSEU", "HGBUR", "BILIRUBINUR",  "KETONESUR", "PROTEINUR", "UROBILINOGEN", "NITRITE", "LEUKOCYTESUR" in the last 72 hours.  Invalid input(s): "APPERANCEUR"     Imaging: No results found.   Medications:    sodium chloride Stopped (12/15/22 0035)   sodium chloride Stopped (12/08/22 1823)   anticoagulant sodium citrate     DAPTOmycin (CUBICIN) 500 mg in sodium chloride 0.9 % IVPB 500 mg (12/23/22 1215)    sodium chloride   Intravenous Once   amiodarone  200 mg Per Tube BID   ascorbic acid  500 mg Per Tube BID   atorvastatin  40 mg Per Tube Daily   chlorhexidine  15 mL Mouth Rinse BID   Chlorhexidine Gluconate Cloth  6 each Topical Q0600   vitamin D3  2,000 Units Per Tube Daily   copper  2 mg Per Tube Daily   droxidopa  300 mg Per Tube TID WC   epoetin (EPOGEN/PROCRIT) injection  10,000 Units Intravenous Q T,Th,Sa-HD   escitalopram  10 mg Per Tube Daily   famotidine  10 mg Per Tube Daily   feeding supplement (NEPRO CARB STEADY)  1,000 mL Per Tube Q24H   feeding supplement (PROSource TF20)  60 mL Per Tube Daily   fludrocortisone  0.2 mg Per Tube Daily   free water  30 mL Per Tube Q4H   heparin injection (subcutaneous)  5,000 Units Subcutaneous Q8H   hydrocortisone cream   Topical TID   hydrOXYzine  50 mg Per Tube Q6H   insulin aspart  0-6 Units Subcutaneous Q4H   midodrine  20 mg Per Tube TID WC   multivitamin  1 tablet Per Tube QHS   mupirocin ointment   Nasal BID   nutrition supplement (JUVEN)  1 packet Per Tube BID BM   mouth rinse  15 mL Mouth Rinse 4 times per day   phenylephrine  10 mg Per J Tube BID   sodium chloride, acetaminophen **OR** acetaminophen, alteplase, alteplase, anticoagulant sodium citrate, artificial tears, clonazePAM, diphenhydrAMINE, fentaNYL (SUBLIMAZE) injection, heparin, ipratropium-albuterol, lidocaine (PF), lidocaine-prilocaine, loperamide HCl, magnesium hydroxide, ondansetron **OR** ondansetron (ZOFRAN) IV, mouth rinse, pentafluoroprop-tetrafluoroeth, sennosides,  traZODone  Assessment/ Plan:  Alexandra Foster is a 72 y.o.  female with past medical history of diabetes, COPD, GERD, hypertension. OA, a fib, and end stage renal disease on hemodialysis. Patient presents to the ED from her nursing facility complaining of altered mental status. She has been admitted for Lower urinary tract infectious disease [N39.0] Altered mental status, unspecified altered mental status type [R41.82] Sepsis due to gram-negative UTI (HCC) [A41.50, N39.0] Sepsis, due to unspecified organism, unspecified whether acute organ dysfunction present (HCC) [A41.9]  [facility]/TTS/Rt Permcath/? 81.3kg  End stage renal disease -Hemodialysis via Lt chest permcath TTS schedule. - Dialysis received earlier today, UF 1L achieved.  - Next treatment scheduled for Thursday  2. Hypotension due to sepsis. Blood cultures and dialysis cath tip cultures with MRSA, Enterococcus (12/06/2022). Repeat cultures (12/07/2022) with no growth. However now with significant leukocytosis.   - Received pressor support during this admission.   - Continue daptomycin as per ID recommendations.  Antibiotics to be continued till 01/15/2023. Patient refused TEE   3. Anemia of chronic kidney disease : PRBC transfusion  on 8/26, 9/3  - Continue EPO with HD treatments  - hgb acceptable, 9.0   4. Secondary Hyperparathyroidism:  with hypophosphatemia.   - Calcium and phosphorus within desired range.   5.  Hypokalemia  -Potassium 3.2      LOS: 17 [place] 9/10/20241:54 PM

## 2022-12-23 NOTE — Progress Notes (Signed)
Hemodialysis note  Received patient in bed to unit. Alert and oriented.  Informed consent signed and in chart.  Treatment initiated: 0916 Treatment completed: 1250  Patient tolerated well. Transported back to room, alert without acute distress.  Report given to patient's RN.   Access used: Left Chest HD PermCath Access issues: High arterial pressure. Lines were reversed during HD.  Total UF removed: 1L Medication(s) given:  Daptomycin IV, Epogen 10 000 units IV  Post HD weight: 84.3 kg   Alexandra Foster [facility]

## 2022-12-24 DIAGNOSIS — E669 Obesity, unspecified: Secondary | ICD-10-CM

## 2022-12-24 DIAGNOSIS — D638 Anemia in other chronic diseases classified elsewhere: Secondary | ICD-10-CM | POA: Insufficient documentation

## 2022-12-24 DIAGNOSIS — N186 End stage renal disease: Secondary | ICD-10-CM | POA: Diagnosis not present

## 2022-12-24 DIAGNOSIS — I480 Paroxysmal atrial fibrillation: Secondary | ICD-10-CM | POA: Diagnosis not present

## 2022-12-24 DIAGNOSIS — L89322 Pressure ulcer of left buttock, stage 2: Secondary | ICD-10-CM

## 2022-12-24 DIAGNOSIS — R7881 Bacteremia: Secondary | ICD-10-CM | POA: Diagnosis not present

## 2022-12-24 DIAGNOSIS — G9341 Metabolic encephalopathy: Secondary | ICD-10-CM | POA: Diagnosis not present

## 2022-12-24 LAB — RENAL FUNCTION PANEL
Albumin: 2.4 g/dL — ABNORMAL LOW (ref 3.5–5.0)
Anion gap: 7 (ref 5–15)
BUN: 35 mg/dL — ABNORMAL HIGH (ref 8–23)
CO2: 29 mmol/L (ref 22–32)
Calcium: 8.7 mg/dL — ABNORMAL LOW (ref 8.9–10.3)
Chloride: 101 mmol/L (ref 98–111)
Creatinine, Ser: 1.86 mg/dL — ABNORMAL HIGH (ref 0.44–1.00)
GFR, Estimated: 28 mL/min — ABNORMAL LOW (ref >60)
Glucose, Bld: 130 mg/dL — ABNORMAL HIGH (ref 70–99)
Phosphorus: 2.7 mg/dL (ref 2.5–4.6)
Potassium: 3.7 mmol/L (ref 3.5–5.1)
Sodium: 137 mmol/L (ref 135–145)

## 2022-12-24 LAB — GLUCOSE, CAPILLARY
Glucose-Capillary: 73 mg/dL (ref 70–99)
Glucose-Capillary: 87 mg/dL (ref 70–99)
Glucose-Capillary: 89 mg/dL (ref 70–99)
Glucose-Capillary: 112 mg/dL — ABNORMAL HIGH (ref 70–99)
Glucose-Capillary: 97 mg/dL (ref 70–99)
Glucose-Capillary: 112 mg/dL — ABNORMAL HIGH (ref 70–99)

## 2022-12-24 MED ORDER — NYSTATIN 100000 UNIT/GM EX POWD
Freq: Three times a day (TID) | CUTANEOUS | Status: DC
  Filled 2022-12-24: qty 15

## 2022-12-24 MED ORDER — DIPHENHYDRAMINE HCL 25 MG PO CAPS
25.0000 mg | ORAL_CAPSULE | Freq: Four times a day (QID) | ORAL | Status: DC | PRN
  Administered 2022-12-25 – 2023-01-02 (×5): 25 mg via ORAL
  Filled 2022-12-24 (×6): qty 1

## 2022-12-24 MED ORDER — HYDROXYZINE HCL 50 MG PO TABS
50.0000 mg | ORAL_TABLET | Freq: Four times a day (QID) | ORAL | Status: DC | PRN
  Administered 2022-12-24 – 2022-12-26 (×3): 50 mg
  Filled 2022-12-24 (×3): qty 1

## 2022-12-24 NOTE — Care Management Important Message (Signed)
Important Message  Patient Details  Name: Alexandra Foster MRN: [id number] Date of Birth: 1950-05-15   Medicare Important Message Given:  N/A - LOS <3 / Initial given by admissions     Olegario Messier A [name] 12/24/2022, 10:36 AM

## 2022-12-24 NOTE — Progress Notes (Signed)
[facility]  ROUNDING NOTE   Subjective:   Patient resting quietly No family present Denies pain or shortness of breath  Nocturnal tubes  Objective:  Vital signs in last 24 hours:  Temp:  [98.9 F (37.2 C)-99.8 F (37.7 C)] 99.4 F (37.4 C) (09/11 0753) Pulse Rate:  [64-80] 64 (09/11 0753) Resp:  [14-20] 20 (09/11 0753) BP: (96-114)/(46-76) 114/46 (09/11 0753) SpO2:  [94 %-99 %] 97 % (09/11 0753) Weight:  [84.4 kg] 84.4 kg (09/10 1250)  Weight change:  Filed Weights   12/23/22 0855 12/23/22 1250  Weight: 85 kg 84.4 kg    Intake/Output: I/O last 3 completed shifts: In: 64 [NG/GT:5780; IV Piggyback:50] Out: 1700 [Urine:700; Other:1000]   Intake/Output this shift:  No intake/output data recorded.  Physical Exam: General: Ill appearing  Head: Normocephalic, atraumatic. Moist oral mucosal membranes  Eyes: Anicteric  Lungs:  Diminished bilaterally   Heart: regular   Abdomen:  Soft, nontender  Extremities:  1+ peripheral edema.  Neurologic: Alert to self and place,    Access:  LT permcath placed on 12/19/22    Basic Metabolic Panel: Recent Labs  Lab 12/18/22 0449 12/19/22 1224 12/20/22 0740 12/21/22 0345 12/22/22 0450 12/23/22 0616 12/24/22 0553  NA 136 137 142 142 140 139 137  K 3.5 3.7 3.7 3.5 3.3* 3.2* 3.7  CL 100 101 106 105 101 103 101  CO2 27 27 28 28 29 28 29   GLUCOSE 112* 129* 141* 130* 125* 166* 130*  BUN 72* 81* 98* 114* 59* 81* 35*  CREATININE 2.18* 2.65* 2.65* 2.68* 1.71* 2.24* 1.86*  CALCIUM 8.7* 9.2 9.1 9.2 8.7* 9.1 8.7*  MG 1.9 2.0  --   --   --   --   --   PHOS 3.0 4.3 3.9 3.8 2.5 3.5 2.7    Liver Function Tests: Recent Labs  Lab 12/20/22 0740 12/21/22 0345 12/22/22 0450 12/23/22 0616 12/24/22 0553  ALBUMIN 2.5* 2.5* 2.7* 2.7* 2.4*   No results for input(s): "LIPASE", "AMYLASE" in the last 168 hours. No results for input(s): "AMMONIA" in the last 168 hours.  CBC: Recent Labs  Lab 12/18/22 0449 12/19/22 1224  12/20/22 0740 12/21/22 0345 12/23/22 0927  WBC 19.6* 22.1* 18.0* 17.0* 16.9*  HGB 8.8* 9.1* 9.2* 8.8* 9.0*  HCT 28.1* 29.7* 30.5* 28.8* 30.2*  MCV 88.9 88.4 89.7 88.3 91.2  PLT 363 461* 489* 483* 456*    Cardiac Enzymes: Recent Labs  Lab 12/19/22 1224  CKTOTAL 16*    BNP: Invalid input(s): "POCBNP"  CBG: Recent Labs  Lab 12/23/22 2025 12/23/22 2311 12/24/22 0340 12/24/22 0823 12/24/22 0829  GLUCAP 89 131* 73 89 87    Microbiology: Results for orders placed or performed during the hospital encounter of 12/05/22  Culture, blood (Routine x 2)     Status: Abnormal   Collection Time: 12/05/22  8:56 PM   Specimen: BLOOD  Result Value Ref Range Status   Specimen Description   Final    BLOOD BLOOD RIGHT ARM Performed at [facility], [address], [place], [place] [address]    Special Requests   Final    BOTTLES DRAWN AEROBIC AND ANAEROBIC Blood Culture adequate volume Performed at [facility], [address] [place], [place] [address]    Culture  Setup Time   Final    GRAM POSITIVE COCCI IN BOTH AEROBIC AND ANAEROBIC BOTTLES CRITICAL RESULT CALLED TO, READ BACK BY AND VERIFIED WITH: SHEEMA HALLLAJI AT 1206 12/06/22.PMF Performed at  [facility], [address], [place], [place] [address]    Culture (A)  Final    STAPHYLOCOCCUS AUREUS SUSCEPTIBILITIES PERFORMED ON PREVIOUS CULTURE WITHIN THE LAST 5 DAYS. Performed at [facility], [address] [place], [place] [address]    Report Status 12/08/2022 FINAL  Final  Culture, blood (Routine x 2)     Status: Abnormal   Collection Time: 12/05/22  8:57 PM   Specimen: BLOOD  Result Value Ref Range Status   Specimen Description   Final    BLOOD BLOOD LEFT ARM Performed at [facility], [address] [place], [place] [address]    Special Requests   Final    BOTTLES DRAWN AEROBIC AND ANAEROBIC Blood Culture adequate volume Performed at [facility], [address] [place], [place] [address]    Culture  Setup Time   Final    GRAM POSITIVE COCCI IN BOTH AEROBIC AND ANAEROBIC BOTTLES CRITICAL RESULT CALLED TO, READ BACK BY AND VERIFIED WITH: SHEEMA HALLAJI AT 1206 12/06/22.PMF Performed at [facility], [address], [place], [place] [address]    Culture METHICILLIN RESISTANT STAPHYLOCOCCUS AUREUS (A)  Final   Report Status 12/08/2022 FINAL  Final   Organism ID, Bacteria METHICILLIN RESISTANT STAPHYLOCOCCUS AUREUS  Final      Susceptibility   Methicillin resistant staphylococcus aureus - MIC*    CIPROFLOXACIN >=8 RESISTANT Resistant     ERYTHROMYCIN >=8 RESISTANT Resistant     GENTAMICIN <=0.5 SENSITIVE Sensitive     OXACILLIN >=4 RESISTANT Resistant     TETRACYCLINE <=1 SENSITIVE Sensitive     VANCOMYCIN <=0.5 SENSITIVE Sensitive     TRIMETH/SULFA >=320 RESISTANT Resistant     CLINDAMYCIN <=0.25 SENSITIVE Sensitive     RIFAMPIN <=0.5 SENSITIVE Sensitive     Inducible Clindamycin NEGATIVE Sensitive     LINEZOLID 2 SENSITIVE Sensitive     * METHICILLIN RESISTANT STAPHYLOCOCCUS AUREUS  Blood Culture ID Panel (Reflexed)     Status: Abnormal   Collection Time: 12/05/22  8:57 PM  Result Value Ref Range Status   Enterococcus faecalis NOT DETECTED NOT DETECTED Final   Enterococcus Faecium NOT DETECTED NOT DETECTED Final   Listeria monocytogenes NOT DETECTED NOT DETECTED Final   Staphylococcus species DETECTED (A) NOT DETECTED Final    Comment: CRITICAL RESULT CALLED TO, READ BACK BY AND VERIFIED WITH: SHEEMA HALLAJI AT 1206 12/06/22.PMF    Staphylococcus aureus (BCID) DETECTED (A) NOT DETECTED Final    Comment: Methicillin (oxacillin)-resistant Staphylococcus aureus (MRSA). MRSA is predictably resistant to beta-lactam antibiotics (except ceftaroline). Preferred therapy is vancomycin unless clinically contraindicated. Patient requires contact precautions if  hospitalized. CRITICAL RESULT CALLED TO, READ BACK BY AND VERIFIED  WITH: SHEEMA HALLAJI AT 1206 12/06/22.PMF    Staphylococcus epidermidis DETECTED (A) NOT DETECTED Final    Comment: CRITICAL RESULT CALLED TO, READ BACK BY AND VERIFIED WITH: SHEEMA HALLAJI AT 1206 12/06/22.PMF    Staphylococcus lugdunensis NOT DETECTED NOT DETECTED Final   Streptococcus species NOT DETECTED NOT DETECTED Final   Streptococcus agalactiae NOT DETECTED NOT DETECTED Final   Streptococcus pneumoniae NOT DETECTED NOT DETECTED Final   Streptococcus pyogenes NOT DETECTED NOT DETECTED Final   A.calcoaceticus-baumannii NOT DETECTED NOT DETECTED Final   Bacteroides fragilis NOT DETECTED NOT DETECTED Final   Enterobacterales NOT DETECTED NOT DETECTED Final   Enterobacter cloacae complex NOT DETECTED NOT DETECTED Final   Escherichia coli NOT DETECTED NOT DETECTED Final   Klebsiella aerogenes NOT DETECTED NOT DETECTED  Final   Klebsiella oxytoca NOT DETECTED NOT DETECTED Final   Klebsiella pneumoniae NOT DETECTED NOT DETECTED Final   Proteus species NOT DETECTED NOT DETECTED Final   Salmonella species NOT DETECTED NOT DETECTED Final   Serratia marcescens NOT DETECTED NOT DETECTED Final   Haemophilus influenzae NOT DETECTED NOT DETECTED Final   Neisseria meningitidis NOT DETECTED NOT DETECTED Final   Pseudomonas aeruginosa NOT DETECTED NOT DETECTED Final   Stenotrophomonas maltophilia NOT DETECTED NOT DETECTED Final   Candida albicans NOT DETECTED NOT DETECTED Final   Candida auris NOT DETECTED NOT DETECTED Final   Candida glabrata NOT DETECTED NOT DETECTED Final   Candida krusei NOT DETECTED NOT DETECTED Final   Candida parapsilosis NOT DETECTED NOT DETECTED Final   Candida tropicalis NOT DETECTED NOT DETECTED Final   Cryptococcus neoformans/gattii NOT DETECTED NOT DETECTED Final   Methicillin resistance mecA/C DETECTED (A) NOT DETECTED Final    Comment: CRITICAL RESULT CALLED TO, READ BACK BY AND VERIFIED WITH: SHEEMA HALLAJI AT 1206 12/06/22.PMF    Meth resistant mecA/C and  MREJ DETECTED (A) NOT DETECTED Final    Comment: CRITICAL RESULT CALLED TO, READ BACK BY AND VERIFIED WITH: SHEEMA HALLAJI AT 1206 12/06/22.PMF Performed at [facility], [address] [place], [place] [address]   Resp panel by RT-PCR (RSV, Flu A&B, Covid) Anterior Nasal Swab     Status: None   Collection Time: 12/05/22  9:53 PM   Specimen: Anterior Nasal Swab  Result Value Ref Range Status   SARS Coronavirus 2 by RT PCR NEGATIVE NEGATIVE Final    Comment: (NOTE) SARS-CoV-2 target nucleic acids are NOT DETECTED.  The SARS-CoV-2 RNA is generally detectable in upper respiratory specimens during the acute phase of infection. The lowest concentration of SARS-CoV-2 viral copies this assay can detect is 138 copies/mL. A negative result does not preclude SARS-Cov-2 infection and should not be used as the sole basis for treatment or other patient management decisions. A negative result may occur with  improper specimen collection/handling, submission of specimen other than nasopharyngeal swab, presence of viral mutation(s) within the areas targeted by this assay, and inadequate number of viral copies(<138 copies/mL). A negative result must be combined with clinical observations, patient history, and epidemiological information. The expected result is Negative.  Fact Sheet for Patients:  BloggerCourse.com  Fact Sheet for Healthcare Providers:  SeriousBroker.it  This test is no t yet approved or cleared by the [place] FDA and  has been authorized for detection and/or diagnosis of SARS-CoV-2 by FDA under an Emergency Use Authorization (EUA). This EUA will remain  in effect (meaning this test can be used) for the duration of the COVID-19 declaration under Section 564(b)(1) of the Act, 21 U.S.C.section 360bbb-3(b)(1), unless the authorization is terminated  or revoked sooner.       Influenza A by PCR NEGATIVE NEGATIVE  Final   Influenza B by PCR NEGATIVE NEGATIVE Final    Comment: (NOTE) The Xpert Xpress SARS-CoV-2/FLU/RSV plus assay is intended as an aid in the diagnosis of influenza from Nasopharyngeal swab specimens and should not be used as a sole basis for treatment. Nasal washings and aspirates are unacceptable for Xpert Xpress SARS-CoV-2/FLU/RSV testing.  Fact Sheet for Patients: BloggerCourse.com  Fact Sheet for Healthcare Providers: SeriousBroker.it  This test is not yet approved or cleared by the [place] FDA and has been authorized for detection and/or diagnosis of SARS-CoV-2 by FDA under an Emergency Use Authorization (EUA). This EUA will remain in effect (meaning this  test can be used) for the duration of the COVID-19 declaration under Section 564(b)(1) of the Act, 21 U.S.C. section 360bbb-3(b)(1), unless the authorization is terminated or revoked.     Resp Syncytial Virus by PCR NEGATIVE NEGATIVE Final    Comment: (NOTE) Fact Sheet for Patients: BloggerCourse.com  Fact Sheet for Healthcare Providers: SeriousBroker.it  This test is not yet approved or cleared by the [place] FDA and has been authorized for detection and/or diagnosis of SARS-CoV-2 by FDA under an Emergency Use Authorization (EUA). This EUA will remain in effect (meaning this test can be used) for the duration of the COVID-19 declaration under Section 564(b)(1) of the Act, 21 U.S.C. section 360bbb-3(b)(1), unless the authorization is terminated or revoked.  Performed at [facility], [address] [place], [place] [address]   MRSA Next Gen by PCR, Nasal     Status: Abnormal   Collection Time: 12/06/22  4:27 AM   Specimen: Nasal Mucosa; Nasal Swab  Result Value Ref Range Status   MRSA by PCR Next Gen DETECTED (A) NOT DETECTED Final    Comment: RESULT CALLED TO, READ BACK BY AND VERIFIED  WITH: KRISTINE CHAMBERS AT 2440 12/06/22.PMF (NOTE) The GeneXpert MRSA Assay (FDA approved for NASAL specimens only), is one component of a comprehensive MRSA colonization surveillance program. It is not intended to diagnose MRSA infection nor to guide or monitor treatment for MRSA infections. Test performance is not FDA approved in patients less than 69 years old. Performed at [facility], [address], [place], [place] [address]   Cath Tip Culture     Status: Abnormal   Collection Time: 12/06/22  2:22 PM   Specimen: Catheter Tip; Other  Result Value Ref Range Status   Specimen Description   Final    CATH TIP Performed at [facility], [address] [place], [place] [address]    Special Requests   Final    NONE Performed at [facility], [address] [place], [place] [address]    Culture (A)  Final    >=100,000 COLONIES/mL METHICILLIN RESISTANT STAPHYLOCOCCUS AUREUS   Report Status 12/08/2022 FINAL  Final   Organism ID, Bacteria METHICILLIN RESISTANT STAPHYLOCOCCUS AUREUS (A)  Final      Susceptibility   Methicillin resistant staphylococcus aureus - MIC*    CIPROFLOXACIN >=8 RESISTANT Resistant     ERYTHROMYCIN >=8 RESISTANT Resistant     GENTAMICIN <=0.5 SENSITIVE Sensitive     OXACILLIN >=4 RESISTANT Resistant     TETRACYCLINE <=1 SENSITIVE Sensitive     VANCOMYCIN <=0.5 SENSITIVE Sensitive     TRIMETH/SULFA >=320 RESISTANT Resistant     CLINDAMYCIN <=0.25 SENSITIVE Sensitive     RIFAMPIN <=0.5 SENSITIVE Sensitive     Inducible Clindamycin NEGATIVE Sensitive     LINEZOLID 2 SENSITIVE Sensitive     * >=100,000 COLONIES/mL METHICILLIN RESISTANT STAPHYLOCOCCUS AUREUS  Urine Culture (for pregnant, neutropenic or urologic patients or patients with an indwelling urinary catheter)     Status: Abnormal   Collection Time: 12/06/22  5:41 PM   Specimen: Urine, Clean Catch  Result Value Ref Range Status   Specimen Description   Final    URINE,  CLEAN CATCH Performed at [facility], [address], [place], [place] [address]    Special Requests   Final    Normal Performed at [facility], [address], [place], [place] [address]    Culture (A)  Final    70,000 COLONIES/mL ENTEROCOCCUS FAECALIS  70,000 COLONIES/mL YEAST    Report Status 12/09/2022 FINAL  Final   Organism ID, Bacteria ENTEROCOCCUS FAECALIS (A)  Final      Susceptibility   Enterococcus faecalis - MIC*    AMPICILLIN <=2 SENSITIVE Sensitive     NITROFURANTOIN <=16 SENSITIVE Sensitive     VANCOMYCIN 1 SENSITIVE Sensitive     * 70,000 COLONIES/mL ENTEROCOCCUS FAECALIS  Culture, blood (Routine X 2) w Reflex to ID Panel     Status: None   Collection Time: 12/07/22  1:31 PM   Specimen: BLOOD  Result Value Ref Range Status   Specimen Description BLOOD BLOOD RIGHT HAND  Final   Special Requests   Final    BOTTLES DRAWN AEROBIC ONLY Blood Culture adequate volume   Culture   Final    NO GROWTH 5 DAYS Performed at [facility], [address] [place], [place] [address]    Report Status 12/12/2022 FINAL  Final  Culture, blood (Routine X 2) w Reflex to ID Panel     Status: None   Collection Time: 12/07/22  6:41 PM   Specimen: BLOOD  Result Value Ref Range Status   Specimen Description BLOOD BLOOD RIGHT HAND  Final   Special Requests   Final    BOTTLES DRAWN AEROBIC AND ANAEROBIC Blood Culture results may not be optimal due to an inadequate volume of blood received in culture bottles   Culture   Final    NO GROWTH 5 DAYS Performed at [facility], [address], [place], [place] [address]    Report Status 12/12/2022 FINAL  Final  C Difficile Quick Screen (NO PCR Reflex)     Status: None   Collection Time: 12/12/22  4:07 PM   Specimen: STOOL  Result Value Ref Range Status   C Diff antigen NEGATIVE NEGATIVE Final   C Diff toxin NEGATIVE NEGATIVE Final   C Diff interpretation No C. difficile detected.  Final     Comment: Performed at [facility], [address] [place], [place] [address]  Gastrointestinal Panel by PCR , Stool     Status: None   Collection Time: 12/13/22  8:38 AM   Specimen: Stool  Result Value Ref Range Status   Campylobacter species NOT DETECTED NOT DETECTED Final   Plesimonas shigelloides NOT DETECTED NOT DETECTED Final   Salmonella species NOT DETECTED NOT DETECTED Final   Yersinia enterocolitica NOT DETECTED NOT DETECTED Final   Vibrio species NOT DETECTED NOT DETECTED Final   Vibrio cholerae NOT DETECTED NOT DETECTED Final   Enteroaggregative E coli (EAEC) NOT DETECTED NOT DETECTED Final   Enteropathogenic E coli (EPEC) NOT DETECTED NOT DETECTED Final   Enterotoxigenic E coli (ETEC) NOT DETECTED NOT DETECTED Final   Shiga like toxin producing E coli (STEC) NOT DETECTED NOT DETECTED Final   Shigella/Enteroinvasive E coli (EIEC) NOT DETECTED NOT DETECTED Final   Cryptosporidium NOT DETECTED NOT DETECTED Final   Cyclospora cayetanensis NOT DETECTED NOT DETECTED Final   Entamoeba histolytica NOT DETECTED NOT DETECTED Final   Giardia lamblia NOT DETECTED NOT DETECTED Final   Adenovirus F40/41 NOT DETECTED NOT DETECTED Final   Astrovirus NOT DETECTED NOT DETECTED Final   Norovirus GI/GII NOT DETECTED NOT DETECTED Final   Rotavirus A NOT DETECTED NOT DETECTED Final   Sapovirus (I, II, IV, and V) NOT DETECTED NOT DETECTED Final    Comment: Performed at [facility], [address], [place], [place] [address]    Coagulation Studies: No results  for input(s): "LABPROT", "INR" in the last 72 hours.   Urinalysis: No results for input(s): "COLORURINE", "LABSPEC", "PHURINE", "GLUCOSEU", "HGBUR", "BILIRUBINUR", "KETONESUR", "PROTEINUR", "UROBILINOGEN", "NITRITE", "LEUKOCYTESUR" in the last 72 hours.  Invalid input(s): "APPERANCEUR"     Imaging: No results found.   Medications:    sodium chloride Stopped (12/15/22 0035)   sodium chloride Stopped  (12/08/22 1823)   anticoagulant sodium citrate     DAPTOmycin (CUBICIN) 500 mg in sodium chloride 0.9 % IVPB Stopped (12/23/22 1245)    sodium chloride   Intravenous Once   amiodarone  200 mg Per Tube BID   ascorbic acid  500 mg Per Tube BID   atorvastatin  40 mg Per Tube Daily   chlorhexidine  15 mL Mouth Rinse BID   Chlorhexidine Gluconate Cloth  6 each Topical Q0600   vitamin D3  2,000 Units Per Tube Daily   copper  2 mg Per Tube Daily   droxidopa  300 mg Per Tube TID WC   epoetin (EPOGEN/PROCRIT) injection  10,000 Units Intravenous Q T,Th,Sa-HD   escitalopram  10 mg Per Tube Daily   famotidine  10 mg Per Tube Daily   feeding supplement (NEPRO CARB STEADY)  237 mL Per Tube 5 X Daily   feeding supplement (PROSource TF20)  60 mL Per Tube Daily   fludrocortisone  0.2 mg Per Tube Daily   free water  60 mL Per Tube 5 X Daily   heparin injection (subcutaneous)  5,000 Units Subcutaneous Q8H   hydrocortisone cream   Topical TID   hydrOXYzine  50 mg Per Tube Q6H   insulin aspart  0-6 Units Subcutaneous Q4H   midodrine  20 mg Per Tube TID WC   multivitamin  1 tablet Per Tube QHS   mupirocin ointment   Nasal BID   mouth rinse  15 mL Mouth Rinse 4 times per day   phenylephrine  10 mg Per J Tube BID   sodium chloride, acetaminophen **OR** acetaminophen, alteplase, alteplase, anticoagulant sodium citrate, artificial tears, clonazePAM, diphenhydrAMINE, fentaNYL (SUBLIMAZE) injection, heparin, ipratropium-albuterol, lidocaine (PF), lidocaine-prilocaine, loperamide HCl, magnesium hydroxide, ondansetron **OR** ondansetron (ZOFRAN) IV, mouth rinse, pentafluoroprop-tetrafluoroeth, sennosides, traZODone  Assessment/ Plan:  Alexandra Foster is a 72 y.o.  female with past medical history of diabetes, COPD, GERD, hypertension. OA, a fib, and end stage renal disease on hemodialysis. Patient presents to the ED from her nursing facility complaining of altered mental status. She has been admitted for  Lower urinary tract infectious disease [N39.0] Altered mental status, unspecified altered mental status type [R41.82] Sepsis due to gram-negative UTI (HCC) [A41.50, N39.0] Sepsis, due to unspecified organism, unspecified whether acute organ dysfunction present (HCC) [A41.9]  [facility]/TTS/Rt Permcath/? 81.3kg  End stage renal disease -Hemodialysis via Lt chest permcath TTS schedule. - Next treatment scheduled for Thursday  2. Hypotension due to sepsis. Blood cultures and dialysis cath tip cultures with MRSA, Enterococcus (12/06/2022). Repeat cultures (12/07/2022) with no growth. However now with significant leukocytosis.   - Received pressor support during this admission.   - Continue daptomycin as per ID recommendations.  Recommendations include Daptomycin 500mg  with dialysis until 01/03/23. Outpatient dialysis clinic notified.   3. Anemia of chronic kidney disease : PRBC transfusion on 8/26, 9/3  - Continue EPO with HD treatments  - hgb 9.0 on 12/23/22.   4. Secondary Hyperparathyroidism:  with hypophosphatemia.   - Will continue to monitor bone minerals during this admission.   5.  Hypokalemia  -Potassium 3.7 today  LOS: 18 Brynn Mulgrew 9/11/202410:50 AM

## 2022-12-24 NOTE — Progress Notes (Signed)
Physical Therapy Treatment Patient Details Name: Alexandra Foster MRN: [id number] DOB: [birth year] Today's Date: 12/24/2022   History of Present Illness 72 y.o female with PMHx significant for ESRD on HD, L AKA admitted with Acute Metabolic Encephalopathy in setting of Severe Sepsis with Septic Shock due to MRSA Bacteremia from infected right chest Permcath, along with Enterococcus Faecalis UTI.    PT Comments  Patient is agreeable to PT session. Four bouts of rolling to the right with cues for using bed rails to facilitate independence with assistance required. Encouraged patient to participate with routine repositioning for decrease caregiver burden. Patient declined therapeutic exercises. Family is requesting PT at home. PT will continue to follow.    If plan is discharge home, recommend the following: A lot of help with bathing/dressing/bathroom;Assistance with cooking/housework;Assist for transportation;Help with stairs or ramp for entrance;Supervision due to cognitive status;Direct supervision/assist for financial management   Can travel by private vehicle        Equipment Recommendations  [place] lift;Hospital bed;BSC/3in1    Recommendations for Other Services       Precautions / Restrictions Precautions Precautions: Fall Restrictions Weight Bearing Restrictions: No     Mobility  Bed Mobility Overal bed mobility: Needs Assistance Bed Mobility: Rolling Rolling: Mod assist, Max assist         General bed mobility comments: 4 bouts of rolling performed to the right. cues for technique using bed rails to facilitate independence. encourage patient to participate with bed mobility to decrease caregiver burden. patient declined further mobility    Transfers                        Ambulation/Gait                   Stairs             Wheelchair Mobility     Tilt Bed    Modified Rankin (Stroke Patients Only)       Balance                                             Cognition Arousal: Alert, Lethargic Behavior During Therapy: WFL for tasks assessed/performed Overall Cognitive Status: History of cognitive impairments - at baseline                                          Exercises      General Comments        Pertinent Vitals/Pain Pain Assessment Pain Assessment: No/denies pain    Home Living                          Prior Function            PT Goals (current goals can now be found in the care plan section) Acute Rehab PT Goals Patient Stated Goal: to go home PT Goal Formulation: Patient unable to participate in goal setting Time For Goal Achievement: 12/27/22 Potential to Achieve Goals: Fair Progress towards PT goals: Progressing toward goals    Frequency    Min 1X/week      PT Plan      Co-evaluation  AM-PAC PT "6 Clicks" Mobility   Outcome Measure  Help needed turning from your back to your side while in a flat bed without using bedrails?: A Lot Help needed moving from lying on your back to sitting on the side of a flat bed without using bedrails?: Total Help needed moving to and from a bed to a chair (including a wheelchair)?: Total Help needed standing up from a chair using your arms (e.g., wheelchair or bedside chair)?: Total Help needed to walk in hospital room?: Total Help needed climbing 3-5 steps with a railing? : Total 6 Click Score: 7    End of Session   Activity Tolerance: Patient limited by fatigue Patient left: in bed;with call bell/phone within reach;with family/visitor present Nurse Communication: Mobility status PT Visit Diagnosis: Muscle weakness (generalized) (M62.81);Difficulty in walking, not elsewhere classified (R26.2)     Time: 1222-1239 PT Time Calculation (min) (ACUTE ONLY): 17 min  Charges:    $Therapeutic Activity: 8-22 mins PT General Charges $$ ACUTE PT VISIT: 1 Visit                      Donna Bernard, PT, MPT    Ina Homes 12/24/2022, 1:17 PM

## 2022-12-24 NOTE — Plan of Care (Signed)

## 2022-12-24 NOTE — TOC Progression Note (Addendum)
Transition of Care [facility]) - Progression Note    Patient Details  Name: Alexandra Foster MRN: [id number] Date of Birth: 09-17-1950  Transition of [facility]) CM/SW Contact  Marlowe Sax, RN Phone Number: 12/24/2022, 11:08 AM  Clinical Narrative:     Sherron Monday with Mitch at [employer] and he is checking to see when the DME will be delivered, I printed the Tube Feeding order to have completed by Physician and gave to the physician to complete to be returned to [employer]  Expected Discharge Plan: Home w Home Health Services Barriers to Discharge: Continued Medical Work up  Expected Discharge Plan and Services     Post Acute Care Choice: Home Health Living arrangements for the past 2 months: Skilled Nursing Facility                                       Social Determinants of Health (SDOH) Interventions SDOH Screenings   Food Insecurity: Patient Unable To Answer (12/06/2022)  Housing: High Risk (12/06/2022)  Transportation Needs: No Transportation Needs (12/06/2022)  Utilities: Not At Risk (12/06/2022)  Alcohol Screen: Low Risk  (02/11/2021)  Depression (PHQ2-9): Low Risk  (04/30/2021)  Financial Resource Strain: Medium Risk (08/09/2020)  Physical Activity: Inactive (08/09/2020)  Social Connections: Moderately Integrated (08/09/2020)  Stress: No Stress Concern Present (08/09/2020)  Tobacco Use: Medium Risk (12/05/2022)    Readmission Risk Interventions     No data to display

## 2022-12-24 NOTE — Assessment & Plan Note (Addendum)
Last hemoglobin 10.2

## 2022-12-24 NOTE — Assessment & Plan Note (Signed)
Bilateral buttocks stage II, present on admission.  See full description below.

## 2022-12-24 NOTE — Progress Notes (Signed)
Patient spouse and daughter was shown how to administer crushed medications and tube feeding with 60 ml of flush through g-tube. Both parties demonstrated and acknowledge understanding thru teach back.

## 2022-12-24 NOTE — TOC Progression Note (Addendum)
Transition of Care [facility]) - Progression Note    Patient Details  Name: Alexandra Foster MRN: [id number] Date of Birth: 1950-08-29  Transition of Care [facility]) CM/SW Contact  Marlowe Sax, RN Phone Number: 12/24/2022, 12:37 PM  Clinical Narrative:    Spoke with the Thora Lance and they are recommending going thru [facility] to do tube feeding since they have a dietician in case needed   I called and left a  secure VM for Pam with [employer] about the Tube feeding, awaiting a response Expected Discharge Plan: Home w Home Health Services Barriers to Discharge: Continued Medical Work up  Expected Discharge Plan and Services     Post Acute Care Choice: Home Health Living arrangements for the past 2 months: Skilled Nursing Facility                                       Social Determinants of Health (SDOH) Interventions SDOH Screenings   Food Insecurity: Patient Unable To Answer (12/06/2022)  Housing: High Risk (12/06/2022)  Transportation Needs: No Transportation Needs (12/06/2022)  Utilities: Not At Risk (12/06/2022)  Alcohol Screen: Low Risk  (02/11/2021)  Depression (PHQ2-9): Low Risk  (04/30/2021)  Financial Resource Strain: Medium Risk (08/09/2020)  Physical Activity: Inactive (08/09/2020)  Social Connections: Moderately Integrated (08/09/2020)  Stress: No Stress Concern Present (08/09/2020)  Tobacco Use: Medium Risk (12/05/2022)    Readmission Risk Interventions     No data to display

## 2022-12-24 NOTE — Assessment & Plan Note (Addendum)
BMI 33.47

## 2022-12-24 NOTE — Assessment & Plan Note (Addendum)
Present on admission.  Initially required pressors.  Currently on midodrine and Florinef.  Patient also on droxidopa will taper down to 200 mg 3 times daily.  Spoke with pharmacist about what to do with this medication upon discharge.  Likely will need a prior approval.  Continue daptomycin through 9/21.

## 2022-12-24 NOTE — Progress Notes (Signed)
Progress Note   Patient: Alexandra Foster DOB: Apr 19, 1950 DOA: 12/05/2022     18 DOS: the patient was seen and examined on 12/24/2022   Brief hospital course: Alexandra Foster is a 72 y.o. female with past medical history significant for asthma, COPD, type diabetes mellitus, s/p G-tube, GERD, ESRD on HD, hypertension, osteoarthritis, paroxysmal atrial fibrillation and OSA on CPAP, who presented to [facility] ED on 12/05/22 from [facility] due to altered mental status with lethargy and decreased responsiveness. The patient's temperature was 102.2 when EMS arrived to this facility. The patient was not responding to questions throughout transport.  Patient had a fever of 101.4 in the emergency room, leukocytosis of 21.4, lactic acid of 2.3. Blood culture on 8/23 positive for MRSA and staph epidermis. 8/23: Presented from [facility] due to AMS, Code Stroke called due to slurred speech and weakness. Deemed not a candidate for thrombolytics due to no clear last known normal time.  Admitted by [facility] with Nephrology consultation for dialysis. 8/24: Rapid response called due to Hypotension prior to/during Hemodialysis.  Transfer to ICU for peripheral vasopressors and initiation of CRRT.  PCCM consulted to assist with vasopressors.  Blood cultures + for MRSA & Staph Epi. ID consulted. Vascular Surgery consulted, permcath removed.  Temporary HD catheter placed. 8/24: this morning with pronounce left facial droop and dysarthria, remains on CRRT on Levo @8  keep MAP>55 12/08/22: patient with multispecies sepsis, removing central line today due to bacteremia. Holding HD as per nephrology team due to shock.  12/09/22-patient with loose watery BM today, she does have skin breakdown over abd pannus.  She had re-initiation of her psychiatric medications today.  S/p pRBC transfusion overnight. Electrolytes are being repleted.  On room air but requires levophed still.  12/10/22- patient laying in bed with  no acute distress.  Levophed weaned to 73mcg/kg/min, afebrile on room air.  Handling NGT feeds well.  Purewick with no UOP this am. 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  12/11/22- patient off levophed , requires HD today, will place catheter per renal team.  Wound care to see for anal wound.  With episode of emesis during HD and concern for possible aspiration. 12/12/22- Back on low dose Levophed, weaning as able. Worsening Leukocytosis, ID plans to check for C.diff.  Ongoing discussions of when to remove newly placed temporary HD catheter. 12/13/22- Remains on levophed gtt despite scheduled midodrine and droxidopa.  HD session completed 12/14/22- No acute events overnight pt remains on levophed gtt.  Received HD. 12/15/22- No acute events overnight, remains on Levophed.  Increase Droxidopa to 300 mg TID. Leukocytosis improving. 12/16/22- No events overnight.  Remains on Levophed (less than yesterday, down to 5 mcg).  Will give 1 unit blood with HD today for Hgb 7.1.  Increase Midodrine to 20 mg TID.  Consult Cardiology for TEE, however pts family declined to proceed with TEE given possible risk associated with procedure  12/17/22: Pt remains on levophed gtt @2mcg /min.  US Venous Bilateral Lower Extremities revealed no evidence of acute or chronic DVT within the right lower extremity. 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    9/11.  Family clearing out space to prepare for her coming home.  Equipment will need to  be delivered.  Assessment and Plan: * Septic shock (HCC) Present on admission.  Initially required pressors.  Currently on midodrine and Florinef.  Continue daptomycin  through 9/21.  MRSA bacteremia MRSA bacteremia, Staph epidermidis bacteremia, Enterococcus faecalis UTI, aspiration pneumonia bilateral lower lobe.  Patient on daptomycin.  Acute metabolic encephalopathy This is improved  Paroxysmal atrial fibrillation (HCC) Continue amiodarone.  Type 2 diabetes mellitus with chronic kidney disease, with long-term current use of insulin (HCC) Patient on sliding scale coverage.  ESRD on hemodialysis (HCC) Continue dialysis as per nephrology  Anemia of chronic disease Last hemoglobin 9.0  Pressure injury of skin Bilateral buttocks stage II, present on admission.  See full description below.  Obesity (BMI 30-39.9) BMI 31.93  Anxiety and depression Continue Klonopin BuSpar and Lexapro.        Subjective: Patient complained of some rectal soreness.  Having some mushy type stools with the tube feeding.  Family working on clearing some space to receive equipment for her at home.  Physical Exam: Vitals:   12/23/22 1250 12/23/22 1646 12/23/22 2313 12/24/22 0753  BP: (!) 104/55 (!) 107/53 (!) 106/51 (!) 114/46  Pulse: 70 68 66 64  Resp: 19 16 20 20   Temp: 98.9 F (37.2 C) 99.8 F (37.7 C) 99.4 F (37.4 C) 99.4 F (37.4 C)  TempSrc: Axillary     SpO2: 94% 95% 97% 97%  Weight: 84.4 kg     Height:       Physical Exam HENT:     Head: Normocephalic.     Mouth/Throat:     Pharynx: No oropharyngeal exudate.  Eyes:     General: Lids are normal.     Conjunctiva/sclera: Conjunctivae normal.  Cardiovascular:     Rate and Rhythm: Normal rate and regular rhythm.     Heart sounds: Normal heart sounds, S1 normal and S2 normal.  Pulmonary:     Breath sounds: No decreased breath sounds, wheezing, rhonchi or rales.  Abdominal:     Palpations: Abdomen is soft.     Tenderness: There is no abdominal tenderness.  Musculoskeletal:     Right lower leg: No swelling.     Left Lower Extremity: Left leg is amputated above knee.  Skin:    General:  Skin is warm.     Findings: No rash.     Comments: Skin irritation on buttock and back.  Neurological:     Mental Status: She is alert.     Comments: Answers questions appropriately.     Data Reviewed: Creatinine 1.86, hemoglobin 9.0, white blood cell count 16.9, platelet count 456  Family Communication: Spoke with patient and his daughter on the phone  Disposition: Status is: Inpatient Remains inpatient appropriate because: [name] clearing out space in the house.  Equipment will need to be delivered.  Planned Discharge Destination: Home with Home Health    Time spent: 28 minutes  Author: Alford Highland, MD 12/24/2022 2:36 PM  For on call review [email or web address].

## 2022-12-25 ENCOUNTER — Inpatient Hospital Stay: Payer: Medicare Other

## 2022-12-25 DIAGNOSIS — G9341 Metabolic encephalopathy: Secondary | ICD-10-CM | POA: Diagnosis not present

## 2022-12-25 DIAGNOSIS — I480 Paroxysmal atrial fibrillation: Secondary | ICD-10-CM | POA: Diagnosis not present

## 2022-12-25 DIAGNOSIS — A419 Sepsis, unspecified organism: Secondary | ICD-10-CM | POA: Diagnosis not present

## 2022-12-25 DIAGNOSIS — R1110 Vomiting, unspecified: Secondary | ICD-10-CM

## 2022-12-25 DIAGNOSIS — N186 End stage renal disease: Secondary | ICD-10-CM | POA: Diagnosis not present

## 2022-12-25 LAB — RENAL FUNCTION PANEL
Albumin: 2.6 g/dL — ABNORMAL LOW (ref 3.5–5.0)
Anion gap: 11 (ref 5–15)
BUN: 51 mg/dL — ABNORMAL HIGH (ref 8–23)
CO2: 28 mmol/L (ref 22–32)
Calcium: 9.2 mg/dL (ref 8.9–10.3)
Chloride: 97 mmol/L — ABNORMAL LOW (ref 98–111)
Creatinine, Ser: 2.31 mg/dL — ABNORMAL HIGH (ref 0.44–1.00)
GFR, Estimated: 22 mL/min — ABNORMAL LOW (ref >60)
Glucose, Bld: 88 mg/dL (ref 70–99)
Phosphorus: 4.1 mg/dL (ref 2.5–4.6)
Potassium: 4 mmol/L (ref 3.5–5.1)
Sodium: 136 mmol/L (ref 135–145)

## 2022-12-25 LAB — GLUCOSE, CAPILLARY
Glucose-Capillary: 105 mg/dL — ABNORMAL HIGH (ref 70–99)
Glucose-Capillary: 120 mg/dL — ABNORMAL HIGH (ref 70–99)
Glucose-Capillary: 144 mg/dL — ABNORMAL HIGH (ref 70–99)
Glucose-Capillary: 151 mg/dL — ABNORMAL HIGH (ref 70–99)
Glucose-Capillary: 92 mg/dL (ref 70–99)

## 2022-12-25 MED ORDER — EPOETIN ALFA 10000 UNIT/ML IJ SOLN
INTRAMUSCULAR | Status: AC
  Filled 2022-12-25: qty 1

## 2022-12-25 MED ORDER — IPRATROPIUM-ALBUTEROL 0.5-2.5 (3) MG/3ML IN SOLN
3.0000 mL | Freq: Four times a day (QID) | RESPIRATORY_TRACT | Status: DC
  Administered 2022-12-25 – 2022-12-26 (×2): 3 mL via RESPIRATORY_TRACT
  Filled 2022-12-25 (×5): qty 3

## 2022-12-25 MED ORDER — NEPRO/CARBSTEADY PO LIQD
237.0000 mL | Freq: Four times a day (QID) | ORAL | Status: DC
  Administered 2022-12-25 – 2022-12-26 (×3): 237 mL

## 2022-12-25 MED ORDER — HEPARIN SODIUM (PORCINE) 1000 UNIT/ML IJ SOLN
INTRAMUSCULAR | Status: AC
  Filled 2022-12-25: qty 10

## 2022-12-25 NOTE — Progress Notes (Signed)
  Received patient in bed to unit.   Informed consent signed and in chart.    TX duration: 3.5 hrs     Hand-off given to patient's nurse.    Access used: L CVC Access issues: None   Total UF removed: 1000 mL Medication(s) given: Epogen Post HD VS: WDL, slightly hypotensive Post HD weight: UTA     [facility]

## 2022-12-25 NOTE — Care Management Important Message (Signed)
Important Message  Patient Details  Name: Alexandra Foster MRN: [id number] Date of Birth: 1950-09-15   Medicare Important Message Given:  N/A - LOS <3 / Initial given by admissions     Olegario Messier A Davin Archuletta 12/25/2022, 9:31 AM

## 2022-12-25 NOTE — Progress Notes (Signed)
[facility]  ROUNDING NOTE   Subjective:   Patient seen at bedside.  A bit nauseous today. Due for dialysis treatment today as well.  Objective:  Vital signs in last 24 hours:  Temp:  [98.2 F (36.8 C)-98.3 F (36.8 C)] 98.3 F (36.8 C) (09/12 1141) Pulse Rate:  [60-66] 62 (09/12 1400) Resp:  [13-21] 13 (09/12 1400) BP: (90-140)/(44-82) 95/56 (09/12 1400) SpO2:  [97 %-100 %] 100 % (09/12 1400) Weight:  [90.3 kg] 90.3 kg (09/12 0500)  Weight change: 5.266 kg Filed Weights   12/23/22 0855 12/23/22 1250 12/25/22 0500  Weight: 85 kg 84.4 kg 90.3 kg    Intake/Output: I/O last 3 completed shifts: In: 1230 [NG/GT:1230] Out: 250 [Urine:250]   Intake/Output this shift:  No intake/output data recorded.  Physical Exam: General: Ill appearing  Head: Normocephalic, atraumatic. Moist oral mucosal membranes  Eyes: Anicteric  Lungs:  Diminished bilaterally   Heart: regular   Abdomen:  Soft, nontender  Extremities: 1+ peripheral edema.  Neurologic: Alert to self and place  Access:  LT permcath placed on 12/19/22    Basic Metabolic Panel: Recent Labs  Lab 12/19/22 1224 12/20/22 0740 12/21/22 0345 12/22/22 0450 12/23/22 0616 12/24/22 0553 12/25/22 0438  NA 137   < > 142 140 139 137 136  K 3.7   < > 3.5 3.3* 3.2* 3.7 4.0  CL 101   < > 105 101 103 101 97*  CO2 27   < > 28 29 28 29 28   GLUCOSE 129*   < > 130* 125* 166* 130* 88  BUN 81*   < > 114* 59* 81* 35* 51*  CREATININE 2.65*   < > 2.68* 1.71* 2.24* 1.86* 2.31*  CALCIUM 9.2   < > 9.2 8.7* 9.1 8.7* 9.2  MG 2.0  --   --   --   --   --   --   PHOS 4.3   < > 3.8 2.5 3.5 2.7 4.1   < > = values in this interval not displayed.    Liver Function Tests: Recent Labs  Lab 12/21/22 0345 12/22/22 0450 12/23/22 0616 12/24/22 0553 12/25/22 0438  ALBUMIN 2.5* 2.7* 2.7* 2.4* 2.6*   No results for input(s): "LIPASE", "AMYLASE" in the last 168 hours. No results for input(s): "AMMONIA" in the last 168  hours.  CBC: Recent Labs  Lab 12/19/22 1224 12/20/22 0740 12/21/22 0345 12/23/22 0927  WBC 22.1* 18.0* 17.0* 16.9*  HGB 9.1* 9.2* 8.8* 9.0*  HCT 29.7* 30.5* 28.8* 30.2*  MCV 88.4 89.7 88.3 91.2  PLT 461* 489* 483* 456*    Cardiac Enzymes: Recent Labs  Lab 12/19/22 1224  CKTOTAL 16*    BNP: Invalid input(s): "POCBNP"  CBG: Recent Labs  Lab 12/24/22 1719 12/24/22 2042 12/25/22 0033 12/25/22 0455 12/25/22 0735  GLUCAP 97 112* 120* 92 151*    Microbiology: Results for orders placed or performed during the hospital encounter of 12/05/22  Culture, blood (Routine x 2)     Status: Abnormal   Collection Time: 12/05/22  8:56 PM   Specimen: BLOOD  Result Value Ref Range Status   Specimen Description   Final    BLOOD BLOOD RIGHT ARM Performed at [facility], [address] [place], [place] [address]    Special Requests   Final    BOTTLES DRAWN AEROBIC AND ANAEROBIC Blood Culture adequate volume Performed at [facility], [address], [place], [place] [address]    Culture  Setup  Time   Final    GRAM POSITIVE COCCI IN BOTH AEROBIC AND ANAEROBIC BOTTLES CRITICAL RESULT CALLED TO, READ BACK BY AND VERIFIED WITH: SHEEMA HALLLAJI AT 1206 12/06/22.PMF Performed at [facility], [address] [place], [place] [address]    Culture (A)  Final    STAPHYLOCOCCUS AUREUS SUSCEPTIBILITIES PERFORMED ON PREVIOUS CULTURE WITHIN THE LAST 5 DAYS. Performed at [facility], [address] [place], [place] [address]    Report Status 12/08/2022 FINAL  Final  Culture, blood (Routine x 2)     Status: Abnormal   Collection Time: 12/05/22  8:57 PM   Specimen: BLOOD  Result Value Ref Range Status   Specimen Description   Final    BLOOD BLOOD LEFT ARM Performed at [facility], [address], [place], [place] [address]    Special Requests   Final    BOTTLES DRAWN AEROBIC AND ANAEROBIC Blood Culture adequate volume Performed at  [facility], [address] [place], [place] [address]    Culture  Setup Time   Final    GRAM POSITIVE COCCI IN BOTH AEROBIC AND ANAEROBIC BOTTLES CRITICAL RESULT CALLED TO, READ BACK BY AND VERIFIED WITH: SHEEMA HALLAJI AT 1206 12/06/22.PMF Performed at [facility], [address], [place], [place] [address]    Culture METHICILLIN RESISTANT STAPHYLOCOCCUS AUREUS (A)  Final   Report Status 12/08/2022 FINAL  Final   Organism ID, Bacteria METHICILLIN RESISTANT STAPHYLOCOCCUS AUREUS  Final      Susceptibility   Methicillin resistant staphylococcus aureus - MIC*    CIPROFLOXACIN >=8 RESISTANT Resistant     ERYTHROMYCIN >=8 RESISTANT Resistant     GENTAMICIN <=0.5 SENSITIVE Sensitive     OXACILLIN >=4 RESISTANT Resistant     TETRACYCLINE <=1 SENSITIVE Sensitive     VANCOMYCIN <=0.5 SENSITIVE Sensitive     TRIMETH/SULFA >=320 RESISTANT Resistant     CLINDAMYCIN <=0.25 SENSITIVE Sensitive     RIFAMPIN <=0.5 SENSITIVE Sensitive     Inducible Clindamycin NEGATIVE Sensitive     LINEZOLID 2 SENSITIVE Sensitive     * METHICILLIN RESISTANT STAPHYLOCOCCUS AUREUS  Blood Culture ID Panel (Reflexed)     Status: Abnormal   Collection Time: 12/05/22  8:57 PM  Result Value Ref Range Status   Enterococcus faecalis NOT DETECTED NOT DETECTED Final   Enterococcus Faecium NOT DETECTED NOT DETECTED Final   Listeria monocytogenes NOT DETECTED NOT DETECTED Final   Staphylococcus species DETECTED (A) NOT DETECTED Final    Comment: CRITICAL RESULT CALLED TO, READ BACK BY AND VERIFIED WITH: SHEEMA HALLAJI AT 1206 12/06/22.PMF    Staphylococcus aureus (BCID) DETECTED (A) NOT DETECTED Final    Comment: Methicillin (oxacillin)-resistant Staphylococcus aureus (MRSA). MRSA is predictably resistant to beta-lactam antibiotics (except ceftaroline). Preferred therapy is vancomycin unless clinically contraindicated. Patient requires contact precautions if  hospitalized. CRITICAL RESULT CALLED TO, READ  BACK BY AND VERIFIED WITH: SHEEMA HALLAJI AT 1206 12/06/22.PMF    Staphylococcus epidermidis DETECTED (A) NOT DETECTED Final    Comment: CRITICAL RESULT CALLED TO, READ BACK BY AND VERIFIED WITH: SHEEMA HALLAJI AT 1206 12/06/22.PMF    Staphylococcus lugdunensis NOT DETECTED NOT DETECTED Final   Streptococcus species NOT DETECTED NOT DETECTED Final   Streptococcus agalactiae NOT DETECTED NOT DETECTED Final   Streptococcus pneumoniae NOT DETECTED NOT DETECTED Final   Streptococcus pyogenes NOT DETECTED NOT DETECTED Final   A.calcoaceticus-baumannii NOT DETECTED NOT DETECTED Final   Bacteroides fragilis NOT DETECTED NOT DETECTED Final  Enterobacterales NOT DETECTED NOT DETECTED Final   Enterobacter cloacae complex NOT DETECTED NOT DETECTED Final   Escherichia coli NOT DETECTED NOT DETECTED Final   Klebsiella aerogenes NOT DETECTED NOT DETECTED Final   Klebsiella oxytoca NOT DETECTED NOT DETECTED Final   Klebsiella pneumoniae NOT DETECTED NOT DETECTED Final   Proteus species NOT DETECTED NOT DETECTED Final   Salmonella species NOT DETECTED NOT DETECTED Final   Serratia marcescens NOT DETECTED NOT DETECTED Final   Haemophilus influenzae NOT DETECTED NOT DETECTED Final   Neisseria meningitidis NOT DETECTED NOT DETECTED Final   Pseudomonas aeruginosa NOT DETECTED NOT DETECTED Final   Stenotrophomonas maltophilia NOT DETECTED NOT DETECTED Final   Candida albicans NOT DETECTED NOT DETECTED Final   Candida auris NOT DETECTED NOT DETECTED Final   Candida glabrata NOT DETECTED NOT DETECTED Final   Candida krusei NOT DETECTED NOT DETECTED Final   Candida parapsilosis NOT DETECTED NOT DETECTED Final   Candida tropicalis NOT DETECTED NOT DETECTED Final   Cryptococcus neoformans/gattii NOT DETECTED NOT DETECTED Final   Methicillin resistance mecA/C DETECTED (A) NOT DETECTED Final    Comment: CRITICAL RESULT CALLED TO, READ BACK BY AND VERIFIED WITH: SHEEMA HALLAJI AT 1206 12/06/22.PMF    Meth  resistant mecA/C and MREJ DETECTED (A) NOT DETECTED Final    Comment: CRITICAL RESULT CALLED TO, READ BACK BY AND VERIFIED WITH: SHEEMA HALLAJI AT 1206 12/06/22.PMF Performed at [facility], [address] [place], [place] [address]   Resp panel by RT-PCR (RSV, Flu A&B, Covid) Anterior Nasal Swab     Status: None   Collection Time: 12/05/22  9:53 PM   Specimen: Anterior Nasal Swab  Result Value Ref Range Status   SARS Coronavirus 2 by RT PCR NEGATIVE NEGATIVE Final    Comment: (NOTE) SARS-CoV-2 target nucleic acids are NOT DETECTED.  The SARS-CoV-2 RNA is generally detectable in upper respiratory specimens during the acute phase of infection. The lowest concentration of SARS-CoV-2 viral copies this assay can detect is 138 copies/mL. A negative result does not preclude SARS-Cov-2 infection and should not be used as the sole basis for treatment or other patient management decisions. A negative result may occur with  improper specimen collection/handling, submission of specimen other than nasopharyngeal swab, presence of viral mutation(s) within the areas targeted by this assay, and inadequate number of viral copies(<138 copies/mL). A negative result must be combined with clinical observations, patient history, and epidemiological information. The expected result is Negative.  Fact Sheet for Patients:  BloggerCourse.com  Fact Sheet for Healthcare Providers:  SeriousBroker.it  This test is no t yet approved or cleared by the [place] FDA and  has been authorized for detection and/or diagnosis of SARS-CoV-2 by FDA under an Emergency Use Authorization (EUA). This EUA will remain  in effect (meaning this test can be used) for the duration of the COVID-19 declaration under Section 564(b)(1) of the Act, 21 U.S.C.section 360bbb-3(b)(1), unless the authorization is terminated  or revoked sooner.       Influenza A by PCR  NEGATIVE NEGATIVE Final   Influenza B by PCR NEGATIVE NEGATIVE Final    Comment: (NOTE) The Xpert Xpress SARS-CoV-2/FLU/RSV plus assay is intended as an aid in the diagnosis of influenza from Nasopharyngeal swab specimens and should not be used as a sole basis for treatment. Nasal washings and aspirates are unacceptable for Xpert Xpress SARS-CoV-2/FLU/RSV testing.  Fact Sheet for Patients: BloggerCourse.com  Fact Sheet for Healthcare Providers: SeriousBroker.it  This test is not yet approved  or cleared by the [place] and has been authorized for detection and/or diagnosis of SARS-CoV-2 by FDA under an Emergency Use Authorization (EUA). This EUA will remain in effect (meaning this test can be used) for the duration of the COVID-19 declaration under Section 564(b)(1) of the Act, 21 U.S.C. section 360bbb-3(b)(1), unless the authorization is terminated or revoked.     Resp Syncytial Virus by PCR NEGATIVE NEGATIVE Final    Comment: (NOTE) Fact Sheet for Patients: BloggerCourse.com  Fact Sheet for Healthcare Providers: SeriousBroker.it  This test is not yet approved or cleared by the [place] FDA and has been authorized for detection and/or diagnosis of SARS-CoV-2 by FDA under an Emergency Use Authorization (EUA). This EUA will remain in effect (meaning this test can be used) for the duration of the COVID-19 declaration under Section 564(b)(1) of the Act, 21 U.S.C. section 360bbb-3(b)(1), unless the authorization is terminated or revoked.  Performed at [facility], [address] [place], [place] [address]   MRSA Next Gen by PCR, Nasal     Status: Abnormal   Collection Time: 12/06/22  4:27 AM   Specimen: Nasal Mucosa; Nasal Swab  Result Value Ref Range Status   MRSA by PCR Next Gen DETECTED (A) NOT DETECTED Final    Comment: RESULT CALLED TO, READ BACK  BY AND VERIFIED WITH: KRISTINE CHAMBERS AT 6045 12/06/22.PMF (NOTE) The GeneXpert MRSA Assay (FDA approved for NASAL specimens only), is one component of a comprehensive MRSA colonization surveillance program. It is not intended to diagnose MRSA infection nor to guide or monitor treatment for MRSA infections. Test performance is not FDA approved in patients less than 68 years old. Performed at [facility], [address] [place], [place] [address]   Cath Tip Culture     Status: Abnormal   Collection Time: 12/06/22  2:22 PM   Specimen: Catheter Tip; Other  Result Value Ref Range Status   Specimen Description   Final    CATH TIP Performed at [facility], [address] [place], [place] [address]    Special Requests   Final    NONE Performed at [facility], [address] [place], [place] [address]    Culture (A)  Final    >=100,000 COLONIES/mL METHICILLIN RESISTANT STAPHYLOCOCCUS AUREUS   Report Status 12/08/2022 FINAL  Final   Organism ID, Bacteria METHICILLIN RESISTANT STAPHYLOCOCCUS AUREUS (A)  Final      Susceptibility   Methicillin resistant staphylococcus aureus - MIC*    CIPROFLOXACIN >=8 RESISTANT Resistant     ERYTHROMYCIN >=8 RESISTANT Resistant     GENTAMICIN <=0.5 SENSITIVE Sensitive     OXACILLIN >=4 RESISTANT Resistant     TETRACYCLINE <=1 SENSITIVE Sensitive     VANCOMYCIN <=0.5 SENSITIVE Sensitive     TRIMETH/SULFA >=320 RESISTANT Resistant     CLINDAMYCIN <=0.25 SENSITIVE Sensitive     RIFAMPIN <=0.5 SENSITIVE Sensitive     Inducible Clindamycin NEGATIVE Sensitive     LINEZOLID 2 SENSITIVE Sensitive     * >=100,000 COLONIES/mL METHICILLIN RESISTANT STAPHYLOCOCCUS AUREUS  Urine Culture (for pregnant, neutropenic or urologic patients or patients with an indwelling urinary catheter)     Status: Abnormal   Collection Time: 12/06/22  5:41 PM   Specimen: Urine, Clean Catch  Result Value Ref Range Status   Specimen Description    Final    URINE, CLEAN CATCH Performed at [facility], [address], [place], [place] [address]    Special Requests  Final    Normal Performed at [facility], [address] [place], [place] [address]    Culture (A)  Final    70,000 COLONIES/mL ENTEROCOCCUS FAECALIS 70,000 COLONIES/mL YEAST    Report Status 12/09/2022 FINAL  Final   Organism ID, Bacteria ENTEROCOCCUS FAECALIS (A)  Final      Susceptibility   Enterococcus faecalis - MIC*    AMPICILLIN <=2 SENSITIVE Sensitive     NITROFURANTOIN <=16 SENSITIVE Sensitive     VANCOMYCIN 1 SENSITIVE Sensitive     * 70,000 COLONIES/mL ENTEROCOCCUS FAECALIS  Culture, blood (Routine X 2) w Reflex to ID Panel     Status: None   Collection Time: 12/07/22  1:31 PM   Specimen: BLOOD  Result Value Ref Range Status   Specimen Description BLOOD BLOOD RIGHT HAND  Final   Special Requests   Final    BOTTLES DRAWN AEROBIC ONLY Blood Culture adequate volume   Culture   Final    NO GROWTH 5 DAYS Performed at [facility], [address] [place], [place] [address]    Report Status 12/12/2022 FINAL  Final  Culture, blood (Routine X 2) w Reflex to ID Panel     Status: None   Collection Time: 12/07/22  6:41 PM   Specimen: BLOOD  Result Value Ref Range Status   Specimen Description BLOOD BLOOD RIGHT HAND  Final   Special Requests   Final    BOTTLES DRAWN AEROBIC AND ANAEROBIC Blood Culture results may not be optimal due to an inadequate volume of blood received in culture bottles   Culture   Final    NO GROWTH 5 DAYS Performed at [facility], [address] [place], [place] [address]    Report Status 12/12/2022 FINAL  Final  C Difficile Quick Screen (NO PCR Reflex)     Status: None   Collection Time: 12/12/22  4:07 PM   Specimen: STOOL  Result Value Ref Range Status   C Diff antigen NEGATIVE NEGATIVE Final   C Diff toxin NEGATIVE NEGATIVE Final   C Diff interpretation No C. difficile detected.   Final    Comment: Performed at [facility], [address] [place], [place] [address]  Gastrointestinal Panel by PCR , Stool     Status: None   Collection Time: 12/13/22  8:38 AM   Specimen: Stool  Result Value Ref Range Status   Campylobacter species NOT DETECTED NOT DETECTED Final   Plesimonas shigelloides NOT DETECTED NOT DETECTED Final   Salmonella species NOT DETECTED NOT DETECTED Final   Yersinia enterocolitica NOT DETECTED NOT DETECTED Final   Vibrio species NOT DETECTED NOT DETECTED Final   Vibrio cholerae NOT DETECTED NOT DETECTED Final   Enteroaggregative E coli (EAEC) NOT DETECTED NOT DETECTED Final   Enteropathogenic E coli (EPEC) NOT DETECTED NOT DETECTED Final   Enterotoxigenic E coli (ETEC) NOT DETECTED NOT DETECTED Final   Shiga like toxin producing E coli (STEC) NOT DETECTED NOT DETECTED Final   Shigella/Enteroinvasive E coli (EIEC) NOT DETECTED NOT DETECTED Final   Cryptosporidium NOT DETECTED NOT DETECTED Final   Cyclospora cayetanensis NOT DETECTED NOT DETECTED Final   Entamoeba histolytica NOT DETECTED NOT DETECTED Final   Giardia lamblia NOT DETECTED NOT DETECTED Final   Adenovirus F40/41 NOT DETECTED NOT DETECTED Final   Astrovirus NOT DETECTED NOT DETECTED Final   Norovirus GI/GII NOT DETECTED NOT DETECTED Final   Rotavirus A NOT DETECTED NOT DETECTED Final   Sapovirus (I, II,  IV, and V) NOT DETECTED NOT DETECTED Final    Comment: Performed at [facility], [address] [place], [place] [address]    Coagulation Studies: No results for input(s): "LABPROT", "INR" in the last 72 hours.   Urinalysis: No results for input(s): "COLORURINE", "LABSPEC", "PHURINE", "GLUCOSEU", "HGBUR", "BILIRUBINUR", "KETONESUR", "PROTEINUR", "UROBILINOGEN", "NITRITE", "LEUKOCYTESUR" in the last 72 hours.  Invalid input(s): "APPERANCEUR"     Imaging: No results found.   Medications:    sodium chloride Stopped (12/15/22 0035)   sodium chloride  Stopped (12/08/22 1823)   anticoagulant sodium citrate     DAPTOmycin (CUBICIN) 500 mg in sodium chloride 0.9 % IVPB Stopped (12/23/22 1245)    sodium chloride   Intravenous Once   amiodarone  200 mg Per Tube BID   ascorbic acid  500 mg Per Tube BID   atorvastatin  40 mg Per Tube Daily   chlorhexidine  15 mL Mouth Rinse BID   Chlorhexidine Gluconate Cloth  6 each Topical Q0600   vitamin D3  2,000 Units Per Tube Daily   copper  2 mg Per Tube Daily   droxidopa  300 mg Per Tube TID WC   epoetin (EPOGEN/PROCRIT) injection  10,000 Units Intravenous Q T,Th,Sa-HD   escitalopram  10 mg Per Tube Daily   famotidine  10 mg Per Tube Daily   feeding supplement (NEPRO CARB STEADY)  237 mL Per Tube 5 X Daily   feeding supplement (PROSource TF20)  60 mL Per Tube Daily   fludrocortisone  0.2 mg Per Tube Daily   free water  60 mL Per Tube 5 X Daily   heparin injection (subcutaneous)  5,000 Units Subcutaneous Q8H   hydrocortisone cream   Topical TID   insulin aspart  0-6 Units Subcutaneous Q4H   ipratropium-albuterol  3 mL Nebulization QID   midodrine  20 mg Per Tube TID WC   multivitamin  1 tablet Per Tube QHS   mupirocin ointment   Nasal BID   nystatin   Topical TID   mouth rinse  15 mL Mouth Rinse 4 times per day   phenylephrine  10 mg Per J Tube BID   sodium chloride, acetaminophen **OR** acetaminophen, alteplase, alteplase, anticoagulant sodium citrate, artificial tears, clonazePAM, diphenhydrAMINE, fentaNYL (SUBLIMAZE) injection, heparin, hydrOXYzine, lidocaine (PF), lidocaine-prilocaine, loperamide HCl, magnesium hydroxide, ondansetron **OR** ondansetron (ZOFRAN) IV, mouth rinse, pentafluoroprop-tetrafluoroeth, sennosides, traZODone  Assessment/ Plan:  [name] is a 72 y.o.  female with past medical history of diabetes, COPD, GERD, hypertension. OA, a fib, and end stage renal disease on hemodialysis. Patient presents to the ED from her nursing facility complaining of altered  mental status. She has been admitted for Lower urinary tract infectious disease [N39.0] Altered mental status, unspecified altered mental status type [R41.82] Sepsis due to gram-negative UTI (HCC) [A41.50, N39.0] Sepsis, due to unspecified organism, unspecified whether acute organ dysfunction present (HCC) [A41.9]  [facility] [place]/TTS/Rt Permcath/? 81.3kg  End stage renal disease -Patient undergo hemodialysis treatment today.  Orders have been prepared.  2. Hypotension due to sepsis. Blood cultures and dialysis cath tip cultures with MRSA, Enterococcus (12/06/2022). Repeat cultures (12/07/2022) with no growth.   - Received pressor support during this admission.   - Continue daptomycin as per ID recommendations.  Recommendations include Daptomycin 500mg  with dialysis until 01/03/23. Outpatient dialysis clinic notified.  3. Anemia of chronic kidney disease : PRBC transfusion on 8/26, 9/3  -Maintain the patient on Epogen.   4. Secondary Hyperparathyroidism:  with hypophosphatemia.   -  Phosphorus 4.1 and at target  5.  Hypokalemia  -Potassium normal now at 4.0.  Continue to periodically monitor.     LOS: 19 Alexandra Foster 9/12/20242:39 PM

## 2022-12-25 NOTE — Assessment & Plan Note (Addendum)
Resolved.  Tube feedings at night.

## 2022-12-25 NOTE — Progress Notes (Signed)
OT Cancellation Note  Patient Details Name: Alexandra Foster MRN: [id number] DOB: [birth year]   Cancelled Treatment:    Reason Eval/Treat Not Completed: Patient at procedure or test/ unavailable. PT currently off floor for HD treatment. OT will re-attempt at next available time.   [name], MS, OTR/L , CBIS ascom [phone]  12/25/22, 1:49 PM

## 2022-12-25 NOTE — Plan of Care (Signed)

## 2022-12-25 NOTE — Plan of Care (Signed)
  Problem: Clinical Measurements: Goal: Diagnostic test results will improve Outcome: Progressing   Problem: Nutrition: Goal: Adequate nutrition will be maintained Outcome: Progressing   Problem: Coping: Goal: Level of anxiety will decrease Outcome: Progressing   

## 2022-12-25 NOTE — Progress Notes (Addendum)
Progress Note   Patient: Alexandra Foster JYN:[id number] DOB: [birth year] DOA: 12/05/2022     19 DOS: the patient was seen and examined on 12/25/2022   Brief hospital course: Alexandra Foster is a 72 y.o. female with past medical history significant for asthma, COPD, type diabetes mellitus, s/p G-tube, GERD, ESRD on HD, hypertension, osteoarthritis, paroxysmal atrial fibrillation and OSA on CPAP, who presented to [facility] ED on 12/05/22 from [facility] due to altered mental status with lethargy and decreased responsiveness. The patient's temperature was 102.2 when EMS arrived to this facility. The patient was not responding to questions throughout transport.  Patient had a fever of 101.4 in the emergency room, leukocytosis of 21.4, lactic acid of 2.3. Blood culture on 8/23 positive for MRSA and staph epidermis. 8/23: Presented from [facility] due to AMS, Code Stroke called due to slurred speech and weakness. Deemed not a candidate for thrombolytics due to no clear last known normal time.  Admitted by [facility] with Nephrology consultation for dialysis. 8/24: Rapid response called due to Hypotension prior to/during Hemodialysis.  Transfer to ICU for peripheral vasopressors and initiation of CRRT.  PCCM consulted to assist with vasopressors.  Blood cultures + for MRSA & Staph Epi. ID consulted. Vascular Surgery consulted, permcath removed.  Temporary HD catheter placed. 8/24: this morning with pronounce left facial droop and dysarthria, remains on CRRT on Levo @8  keep MAP>55 12/08/22: patient with multispecies sepsis, removing central line today due to bacteremia. Holding HD as per nephrology team due to shock.  12/09/22-patient with loose watery BM today, she does have skin breakdown over abd pannus.  She had re-initiation of her psychiatric medications today.  S/p pRBC transfusion overnight. Electrolytes are being repleted.  On room air but requires levophed still.  12/10/22- patient laying in bed with  no acute distress.  Levophed weaned to 22mcg/kg/min, afebrile on room air.  Handling NGT feeds well.  Purewick with no UOP this am. 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  12/11/22- patient off levophed , requires HD today, will place catheter per renal team.  Wound care to see for anal wound.  With episode of emesis during HD and concern for possible aspiration. 12/12/22- Back on low dose Levophed, weaning as able. Worsening Leukocytosis, ID plans to check for C.diff.  Ongoing discussions of when to remove newly placed temporary HD catheter. 12/13/22- Remains on levophed gtt despite scheduled midodrine and droxidopa.  HD session completed 12/14/22- No acute events overnight pt remains on levophed gtt.  Received HD. 12/15/22- No acute events overnight, remains on Levophed.  Increase Droxidopa to 300 mg TID. Leukocytosis improving. 12/16/22- No events overnight.  Remains on Levophed (less than yesterday, down to 5 mcg).  Will give 1 unit blood with HD today for Hgb 7.1.  Increase Midodrine to 20 mg TID.  Consult Cardiology for TEE, however pts family declined to proceed with TEE given possible risk associated with procedure  12/17/22: Pt remains on levophed gtt @2mcg /min.  US Venous Bilateral Lower Extremities revealed no evidence of acute or chronic DVT within the right lower extremity. 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    9/11.  Family clearing out space to prepare for her coming home.  Equipment will need to  be delivered. 9/12.  Patient vomited today.  DuoNeb nebulized solution ordered.  1 dose of Zofran given.  Will get chest x-ray and abdominal x-ray.  [name] would  like to cut down to 4 cans of tube feeding today.  Assessment and Plan: * Septic shock (HCC) Present on admission.  Initially required pressors.  Currently on midodrine and Florinef.  Continue daptomycin through 9/21.  MRSA bacteremia MRSA bacteremia, Staph epidermidis bacteremia, Enterococcus faecalis UTI, aspiration pneumonia bilateral lower lobe.  Patient on daptomycin.  Acute metabolic encephalopathy This is improved  Paroxysmal atrial fibrillation (HCC) Continue amiodarone.  Type 2 diabetes mellitus with chronic kidney disease, with long-term current use of insulin (HCC) Patient on sliding scale coverage.  ESRD on hemodialysis (HCC) Continue dialysis as per nephrology  Vomiting Give a dose of Zofran today.  Continue to monitor.  Decrease down to 4 cans of tube feeding today.  Get chest x-ray and abdominal x-ray.  Anemia of chronic disease Last hemoglobin 9.0  Pressure injury of skin Bilateral buttocks stage II, present on admission.  See full description below.  Obesity (BMI 30-39.9) BMI 34.16.  Anxiety and depression Continue Klonopin BuSpar and Lexapro.        Subjective: Patient vomited this morning.  No abdominal pain but felt nauseous.  Patient has a little bit of a wheeze today.  Patient admitted with septic shock.  Physical Exam: Vitals:   12/25/22 1330 12/25/22 1400 12/25/22 1430 12/25/22 1500  BP: (!) 90/46 (!) 95/56 103/68 (!) 131/110  Pulse: 60 62 66 60  Resp: 15 13 19 18   Temp:      TempSrc:      SpO2: 100% 100% 100% 100%  Weight:      Height:       Physical Exam HENT:     Head: Normocephalic.     Mouth/Throat:     Pharynx: No oropharyngeal exudate.  Eyes:     General: Lids are normal.     Conjunctiva/sclera: Conjunctivae normal.  Cardiovascular:     Rate and Rhythm: Normal rate and regular rhythm.     Heart sounds: Normal heart sounds, S1 normal and S2 normal.  Pulmonary:     Breath sounds: Examination of the right-middle field  reveals wheezing. Examination of the left-middle field reveals wheezing. Examination of the right-lower field reveals wheezing. Examination of the left-lower field reveals wheezing. Wheezing present. No decreased breath sounds, rhonchi or rales.  Abdominal:     Palpations: Abdomen is soft.     Tenderness: There is no abdominal tenderness.  Musculoskeletal:     Right lower leg: No swelling.     Left Lower Extremity: Left leg is amputated above knee.  Skin:    General: Skin is warm.     Findings: No rash.     Comments: Skin irritation on buttock and back.  Neurological:     Mental Status: She is alert.     Comments: Answers questions appropriately.     Data Reviewed: Creatinine 2.31, potassium 4.0  Family Communication: Updated daughter on the phone  Disposition: Status is: Inpatient Remains inpatient appropriate because: IV Zofran today.  [name] would like to cut down to 4 cans of tube feeding today.  Will get abdominal x-ray and chest x-ray.  Planned Discharge Destination: Home with home health    Time spent: 28 minutes  Author: Alford Highland, MD 12/25/2022 3:27 PM  For on call review [email or web address].

## 2022-12-25 NOTE — Consult Note (Addendum)
Pharmacy Antibiotic Note  ASSESSMENT: 72 y.o. female with PMH asthma, COPD, DM, G-tube, ESRD-HD is presenting with bacteremia. She initially presented on 8/23 with AMS and slurred speech and was transferred to ICU after becoming hypotensive prior to/during HD. Blood cultures returned positive for MRSA and ID was consulted. Patient declined TEE and the plan was for her to received vancomycin for 4 weeks, but patient developed a persistent rash and was subsequently transitioned to daptomycin with HD. Pharmacy has been consulted to manage daptomycin dosing.  Plan per ID note on 9/5 is 4 weeks of treatment for MRSA bacteremia using 12/07/2022 as day 1. Per nephrology note on 9/11, HD is planned for today, 9/12, in accordance with her regular HD schedule.  Patient measurements: Height: 5\' 4"  (162.6 cm) Weight: 90.3 kg (199 lb) IBW/kg (Calculated) : 54.7  Vital signs: Temp: 98.2 F (36.8 C) (09/12 0036) BP: 111/64 (09/12 0036) Pulse Rate: 66 (09/12 0036) Recent Labs  Lab 12/20/22 0740 12/21/22 0345 12/22/22 0450 12/23/22 0616 12/23/22 0927 12/24/22 0553 12/25/22 0438  WBC 18.0* 17.0*  --   --  16.9*  --   --   CREATININE 2.65* 2.68*   < > 2.24*  --  1.86* 2.31*   < > = values in this interval not displayed.   Estimated Creatinine Clearance: 23.2 mL/min (A) (by C-G formula based on SCr of 2.31 mg/dL (H)).  Allergies: No Known Allergies  Antimicrobials this admission: Azithromycin and Ceftriaxone 8/23 x 1 Cefepime 8/24 >> 8/26 Vancomycin 8/24 >> 9/6 Unasyn 8/30 >> 9/2 Cefazolin 9/6 x 1 Daptomycin 9/8 >>   Microbiology results: 8/23 BCx: 4/4 MRSA 8/23 Cath tip: MRSA 8/24 MRSA PCR: Positive  8/25 BCx: NGF 8/30 Cdiff screen: negative 8/31 Stool panel: negative  PLAN: Day #19 of antibiotics with appropriate MRSA coverage Continue daptomycin 500 mg every TuThSa, in accordance with dialysis schedule Will check weekly CK with AM labs tomorrow   Thank you for allowing  pharmacy to be a part of this patient's care.  Will M. Dareen Piano, PharmD Clinical Pharmacist 12/25/2022 8:17 AM

## 2022-12-26 DIAGNOSIS — N186 End stage renal disease: Secondary | ICD-10-CM | POA: Diagnosis not present

## 2022-12-26 DIAGNOSIS — R1310 Dysphagia, unspecified: Secondary | ICD-10-CM

## 2022-12-26 DIAGNOSIS — A419 Sepsis, unspecified organism: Secondary | ICD-10-CM | POA: Diagnosis not present

## 2022-12-26 DIAGNOSIS — G9341 Metabolic encephalopathy: Secondary | ICD-10-CM | POA: Diagnosis not present

## 2022-12-26 DIAGNOSIS — I480 Paroxysmal atrial fibrillation: Secondary | ICD-10-CM | POA: Diagnosis not present

## 2022-12-26 LAB — RENAL FUNCTION PANEL
Albumin: 2.6 g/dL — ABNORMAL LOW (ref 3.5–5.0)
Anion gap: 9 (ref 5–15)
BUN: 25 mg/dL — ABNORMAL HIGH (ref 8–23)
CO2: 28 mmol/L (ref 22–32)
Calcium: 8.8 mg/dL — ABNORMAL LOW (ref 8.9–10.3)
Chloride: 95 mmol/L — ABNORMAL LOW (ref 98–111)
Creatinine, Ser: 1.76 mg/dL — ABNORMAL HIGH (ref 0.44–1.00)
GFR, Estimated: 30 mL/min — ABNORMAL LOW (ref >60)
Glucose, Bld: 86 mg/dL (ref 70–99)
Phosphorus: 3.3 mg/dL (ref 2.5–4.6)
Potassium: 3.9 mmol/L (ref 3.5–5.1)
Sodium: 132 mmol/L — ABNORMAL LOW (ref 135–145)

## 2022-12-26 LAB — GLUCOSE, CAPILLARY
Glucose-Capillary: 111 mg/dL — ABNORMAL HIGH (ref 70–99)
Glucose-Capillary: 83 mg/dL (ref 70–99)
Glucose-Capillary: 96 mg/dL (ref 70–99)
Glucose-Capillary: 145 mg/dL — ABNORMAL HIGH (ref 70–99)
Glucose-Capillary: 104 mg/dL — ABNORMAL HIGH (ref 70–99)
Glucose-Capillary: 133 mg/dL — ABNORMAL HIGH (ref 70–99)

## 2022-12-26 LAB — CK: Total CK: 15 U/L — ABNORMAL LOW (ref 38–234)

## 2022-12-26 MED ORDER — HYDROXYZINE HCL 50 MG PO TABS
100.0000 mg | ORAL_TABLET | Freq: Two times a day (BID) | ORAL | Status: DC
  Administered 2022-12-26 – 2023-01-03 (×16): 100 mg
  Filled 2022-12-26 (×17): qty 2

## 2022-12-26 MED ORDER — NEPRO/CARBSTEADY PO LIQD
1120.0000 mL | ORAL | Status: DC
  Administered 2022-12-26 – 2023-01-01 (×7): 1120 mL

## 2022-12-26 MED ORDER — FREE WATER
50.0000 mL | Status: DC
  Administered 2022-12-26 – 2023-01-03 (×44): 50 mL

## 2022-12-26 MED ORDER — JUVEN PO PACK
1.0000 | PACK | Freq: Two times a day (BID) | ORAL | Status: DC
  Administered 2022-12-26 – 2023-01-03 (×13): 1

## 2022-12-26 MED ORDER — IPRATROPIUM-ALBUTEROL 0.5-2.5 (3) MG/3ML IN SOLN: 3.0000 mL | RESPIRATORY_TRACT | Status: DC | PRN

## 2022-12-26 MED ORDER — LOPERAMIDE HCL 1 MG/7.5ML PO SUSP
4.0000 mg | Freq: Three times a day (TID) | ORAL | Status: DC | PRN
  Administered 2022-12-26 – 2022-12-29 (×5): 4 mg
  Filled 2022-12-26 (×5): qty 30

## 2022-12-26 NOTE — TOC Progression Note (Signed)
Transition of Care [facility]) - Progression Note    Patient Details  Name: Alexandra Foster MRN: [id number] Date of Birth: [birth year]  Transition of Care [facility]) CM/SW Contact  Marlowe Sax, RN Phone Number: 12/26/2022, 10:45 AM  Clinical Narrative:     Sherron Monday with Elita Quick with [employer] she is working with he dietician here at the hospital to determine feeding tube needs, DME to be delivered to the house anticipate DC on Monday  Expected Discharge Plan: Home w Home Health Services Barriers to Discharge: Continued Medical Work up  Expected Discharge Plan and Services     Post Acute Care Choice: Home Health Living arrangements for the past 2 months: Skilled Nursing Facility                           HH Arranged: PT, OT, RN [facility] Agency: [facility] Date HH Agency Contacted: 12/24/22 Time HH Agency Contacted: 1310 Representative spoke with at [facility] Agency: Barbara Cower   Social Determinants of Health (SDOH) Interventions SDOH Screenings   Food Insecurity: Patient Unable To Answer (12/06/2022)  Housing: High Risk (12/06/2022)  Transportation Needs: No Transportation Needs (12/06/2022)  Utilities: Not At Risk (12/06/2022)  Alcohol Screen: Low Risk  (02/11/2021)  Depression (PHQ2-9): Low Risk  (04/30/2021)  Financial Resource Strain: Medium Risk (08/09/2020)  Physical Activity: Inactive (08/09/2020)  Social Connections: Moderately Integrated (08/09/2020)  Stress: No Stress Concern Present (08/09/2020)  Tobacco Use: Medium Risk (12/05/2022)    Readmission Risk Interventions     No data to display

## 2022-12-26 NOTE — Progress Notes (Signed)
Progress Note   Patient: Alexandra Foster XBJ:[id number] DOB: [birth year] DOA: 12/05/2022     20 DOS: the patient was seen and examined on 12/26/2022   Brief hospital course: Pele Weidert is a 72 y.o. female with past medical history significant for asthma, COPD, type diabetes mellitus, s/p G-tube, GERD, ESRD on HD, hypertension, osteoarthritis, paroxysmal atrial fibrillation and OSA on CPAP, who presented to [facility] ED on 12/05/22 from [facility] due to altered mental status with lethargy and decreased responsiveness. The patient's temperature was 102.2 when EMS arrived to this facility. The patient was not responding to questions throughout transport.  Patient had a fever of 101.4 in the emergency room, leukocytosis of 21.4, lactic acid of 2.3. Blood culture on 8/23 positive for MRSA and staph epidermis. 8/23: Presented from [facility] due to AMS, Code Stroke called due to slurred speech and weakness. Deemed not a candidate for thrombolytics due to no clear last known normal time.  Admitted by [facility] with Nephrology consultation for dialysis. 8/24: Rapid response called due to Hypotension prior to/during Hemodialysis.  Transfer to ICU for peripheral vasopressors and initiation of CRRT.  PCCM consulted to assist with vasopressors.  Blood cultures + for MRSA & Staph Epi. ID consulted. Vascular Surgery consulted, permcath removed.  Temporary HD catheter placed. 8/24: this morning with pronounce left facial droop and dysarthria, remains on CRRT on Levo @8  keep MAP>55 12/08/22: patient with multispecies sepsis, removing central line today due to bacteremia. Holding HD as per nephrology team due to shock.  12/09/22-patient with loose watery BM today, she does have skin breakdown over abd pannus.  She had re-initiation of her psychiatric medications today.  S/p pRBC transfusion overnight. Electrolytes are being repleted.  On room air but requires levophed still.  12/10/22- patient laying in bed with  no acute distress.  Levophed weaned to 61mcg/kg/min, afebrile on room air.  Handling NGT feeds well.  Purewick with no UOP this am. 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  12/11/22- patient off levophed , requires HD today, will place catheter per renal team.  Wound care to see for anal wound.  With episode of emesis during HD and concern for possible aspiration. 12/12/22- Back on low dose Levophed, weaning as able. Worsening Leukocytosis, ID plans to check for C.diff.  Ongoing discussions of when to remove newly placed temporary HD catheter. 12/13/22- Remains on levophed gtt despite scheduled midodrine and droxidopa.  HD session completed 12/14/22- No acute events overnight pt remains on levophed gtt.  Received HD. 12/15/22- No acute events overnight, remains on Levophed.  Increase Droxidopa to 300 mg TID. Leukocytosis improving. 12/16/22- No events overnight.  Remains on Levophed (less than yesterday, down to 5 mcg).  Will give 1 unit blood with HD today for Hgb 7.1.  Increase Midodrine to 20 mg TID.  Consult Cardiology for TEE, however pts family declined to proceed with TEE given possible risk associated with procedure  12/17/22: Pt remains on levophed gtt @2mcg /min.  US Venous Bilateral Lower Extremities revealed no evidence of acute or chronic DVT within the right lower extremity. 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    9/11.  Family clearing out space to prepare for her coming home.  Equipment will need to  be delivered. 9/12.  Patient vomited today.  DuoNeb nebulized solution ordered.  1 dose of Zofran given.  Will get chest x-ray and abdominal x-ray.  [name] would  like to cut down to 4 cans of tube feeding today. 9/13.  Dietitian spoke with patient's daughter and they will do continuous feeding at night.  Assessment and Plan: * Septic shock (HCC) Present on admission.  Initially required pressors.  Currently on midodrine and Florinef.  Continue daptomycin through 9/21.  MRSA bacteremia MRSA bacteremia, Staph epidermidis bacteremia, Enterococcus faecalis UTI, aspiration pneumonia bilateral lower lobe.  Patient on daptomycin.  Acute metabolic encephalopathy This is improved  Paroxysmal atrial fibrillation (HCC) Continue amiodarone.  Type 2 diabetes mellitus with chronic kidney disease, with long-term current use of insulin (HCC) Patient on sliding scale coverage.  ESRD on hemodialysis (HCC) Continue dialysis as per nephrology  Dysphagia Severe dysphagia secondary to prolonged trach.  Has been on tube feeding since then.  Dietitian spoke with patient's daughter to resume continuous tube feedings at night.  Vomiting Resolved.  Dietitian spoke with patient's daughter and they will do tube feedings at night.  Anemia of chronic disease Last hemoglobin 9.0  Pressure injury of skin Bilateral buttocks stage II, present on admission.  See full description below.  Obesity (BMI 30-39.9) BMI 34.16.  Anxiety and depression Continue Klonopin BuSpar and Lexapro.        Subjective: Patient was feeling okay.  No further vomiting.  She did have some itching in her buttock area and had a bowel movement there that needed to be cleaned up.  Offers no complaints today.  Physical Exam: Vitals:   12/25/22 1630 12/25/22 1739 12/25/22 2023 12/26/22 0830  BP: 90/75 (!) 98/58  116/71  Pulse: 65 69  66  Resp: 15 17  16   Temp:  98.2 F (36.8 C)  97.9 F (36.6 C)  TempSrc:      SpO2:  100% 95% 98%  Weight:      Height:       Physical Exam HENT:     Head: Normocephalic.     Mouth/Throat:     Pharynx: No oropharyngeal exudate.  Eyes:      General: Lids are normal.     Conjunctiva/sclera: Conjunctivae normal.  Cardiovascular:     Rate and Rhythm: Normal rate and regular rhythm.     Heart sounds: Normal heart sounds, S1 normal and S2 normal.  Pulmonary:     Breath sounds: No decreased breath sounds, wheezing, rhonchi or rales.  Abdominal:     Palpations: Abdomen is soft.     Tenderness: There is no abdominal tenderness.  Musculoskeletal:     Right lower leg: No swelling.     Left Lower Extremity: Left leg is amputated above knee.  Skin:    General: Skin is warm.     Findings: No rash.     Comments: Skin irritation on buttock and back.  Neurological:     Mental Status: She is alert.     Comments: Answers questions appropriately.     Data Reviewed: Creatinine 1.76, sodium 132, CK15  Family Communication: Spoke with patient's sister and husband at the bedside  Disposition: Status is: Inpatient Remains inpatient appropriate because: Family still waiting on delivery for equipment at home  Planned Discharge Destination: Home with Home Health    Time spent: 28 minutes  Author: Alford Highland, MD 12/26/2022 3:48 PM  For on call review [email or web address].

## 2022-12-26 NOTE — Progress Notes (Signed)
[facility]  ROUNDING NOTE   Subjective:   Patient resting comfortably in bed today. Due for dialysis treatment again tomorrow.  Objective:  Vital signs in last 24 hours:  Temp:  [97.9 F (36.6 C)-98.2 F (36.8 C)] 97.9 F (36.6 C) (09/13 0830) Pulse Rate:  [60-72] 66 (09/13 0830) Resp:  [15-32] 16 (09/13 0830) BP: (90-176)/(57-144) 116/71 (09/13 0830) SpO2:  [95 %-100 %] 98 % (09/13 0830)  Weight change:  Filed Weights   12/23/22 0855 12/23/22 1250 12/25/22 0500  Weight: 85 kg 84.4 kg 90.3 kg    Intake/Output: I/O last 3 completed shifts: In: -  Out: 1700 [Urine:700; Other:1000]   Intake/Output this shift:  No intake/output data recorded.  Physical Exam: General: Ill appearing  Head: Normocephalic, atraumatic. Moist oral mucosal membranes  Eyes: Anicteric  Lungs:  Diminished bilaterally   Heart: regular   Abdomen:  Soft, nontender  Extremities: 1+ peripheral edema.  Neurologic: Alert to self and place  Access:  LT permcath placed on 12/19/22    Basic Metabolic Panel: Recent Labs  Lab 12/22/22 0450 12/23/22 0616 12/24/22 0553 12/25/22 0438 12/26/22 0425  NA 140 139 137 136 132*  K 3.3* 3.2* 3.7 4.0 3.9  CL 101 103 101 97* 95*  CO2 29 28 29 28 28   GLUCOSE 125* 166* 130* 88 86  BUN 59* 81* 35* 51* 25*  CREATININE 1.71* 2.24* 1.86* 2.31* 1.76*  CALCIUM 8.7* 9.1 8.7* 9.2 8.8*  PHOS 2.5 3.5 2.7 4.1 3.3    Liver Function Tests: Recent Labs  Lab 12/22/22 0450 12/23/22 0616 12/24/22 0553 12/25/22 0438 12/26/22 0425  ALBUMIN 2.7* 2.7* 2.4* 2.6* 2.6*   No results for input(s): "LIPASE", "AMYLASE" in the last 168 hours. No results for input(s): "AMMONIA" in the last 168 hours.  CBC: Recent Labs  Lab 12/20/22 0740 12/21/22 0345 12/23/22 0927  WBC 18.0* 17.0* 16.9*  HGB 9.2* 8.8* 9.0*  HCT 30.5* 28.8* 30.2*  MCV 89.7 88.3 91.2  PLT 489* 483* 456*    Cardiac Enzymes: Recent Labs  Lab 12/26/22 0425  CKTOTAL 15*     BNP: Invalid input(s): "POCBNP"  CBG: Recent Labs  Lab 12/25/22 2120 12/26/22 0133 12/26/22 0355 12/26/22 0837 12/26/22 1203  GLUCAP 144* 111* 83 96 145*    Microbiology: Results for orders placed or performed during the hospital encounter of 12/05/22  Culture, blood (Routine x 2)     Status: Abnormal   Collection Time: 12/05/22  8:56 PM   Specimen: BLOOD  Result Value Ref Range Status   Specimen Description   Final    BLOOD BLOOD RIGHT ARM Performed at [facility], [address], [place], [place] [address]    Special Requests   Final    BOTTLES DRAWN AEROBIC AND ANAEROBIC Blood Culture adequate volume Performed at [facility], [address] [place], [place] [address]    Culture  Setup Time   Final    GRAM POSITIVE COCCI IN BOTH AEROBIC AND ANAEROBIC BOTTLES CRITICAL RESULT CALLED TO, READ BACK BY AND VERIFIED WITH: SHEEMA HALLLAJI AT 1206 12/06/22.PMF Performed at [facility], [address] [place], [place] [address]    Culture (A)  Final    STAPHYLOCOCCUS AUREUS SUSCEPTIBILITIES PERFORMED ON PREVIOUS CULTURE WITHIN THE LAST 5 DAYS. Performed at [facility] [place] [facility], [address] [place], [place] [address]    Report Status 12/08/2022 FINAL  Final  Culture, blood (Routine x 2)     Status: Abnormal  Collection Time: 12/05/22  8:57 PM   Specimen: BLOOD  Result Value Ref Range Status   Specimen Description   Final    BLOOD BLOOD LEFT ARM Performed at [facility], [address] [place], [place] [address]    Special Requests   Final    BOTTLES DRAWN AEROBIC AND ANAEROBIC Blood Culture adequate volume Performed at [facility], [address] [place], [place] [address]    Culture  Setup Time   Final    GRAM POSITIVE COCCI IN BOTH AEROBIC AND ANAEROBIC BOTTLES CRITICAL RESULT CALLED TO, READ BACK BY AND VERIFIED WITH: [facility] HALLAJI AT 1206 12/06/22.PMF Performed at [facility], [address], [place], [place] [address]    Culture METHICILLIN RESISTANT STAPHYLOCOCCUS AUREUS (A)  Final   Report Status 12/08/2022 FINAL  Final   Organism ID, Bacteria METHICILLIN RESISTANT STAPHYLOCOCCUS AUREUS  Final      Susceptibility   Methicillin resistant staphylococcus aureus - MIC*    CIPROFLOXACIN >=8 RESISTANT Resistant     ERYTHROMYCIN >=8 RESISTANT Resistant     GENTAMICIN <=0.5 SENSITIVE Sensitive     OXACILLIN >=4 RESISTANT Resistant     TETRACYCLINE <=1 SENSITIVE Sensitive     VANCOMYCIN <=0.5 SENSITIVE Sensitive     TRIMETH/SULFA >=320 RESISTANT Resistant     CLINDAMYCIN <=0.25 SENSITIVE Sensitive     RIFAMPIN <=0.5 SENSITIVE Sensitive     Inducible Clindamycin NEGATIVE Sensitive     LINEZOLID 2 SENSITIVE Sensitive     * METHICILLIN RESISTANT STAPHYLOCOCCUS AUREUS  Blood Culture ID Panel (Reflexed)     Status: Abnormal   Collection Time: 12/05/22  8:57 PM  Result Value Ref Range Status   Enterococcus faecalis NOT DETECTED NOT DETECTED Final   Enterococcus Faecium NOT DETECTED NOT DETECTED Final   Listeria monocytogenes NOT DETECTED NOT DETECTED Final   Staphylococcus species DETECTED (A) NOT DETECTED Final    Comment: CRITICAL RESULT CALLED TO, READ BACK BY AND VERIFIED WITH: SHEEMA HALLAJI AT 1206 12/06/22.PMF    Staphylococcus aureus (BCID) DETECTED (A) NOT DETECTED Final    Comment: Methicillin (oxacillin)-resistant Staphylococcus aureus (MRSA). MRSA is predictably resistant to beta-lactam antibiotics (except ceftaroline). Preferred therapy is vancomycin unless clinically contraindicated. Patient requires contact precautions if  hospitalized. CRITICAL RESULT CALLED TO, READ BACK BY AND VERIFIED WITH: SHEEMA HALLAJI AT 1206 12/06/22.PMF    Staphylococcus epidermidis DETECTED (A) NOT DETECTED Final    Comment: CRITICAL RESULT CALLED TO, READ BACK BY AND VERIFIED WITH: SHEEMA HALLAJI AT 1206 12/06/22.PMF    Staphylococcus lugdunensis NOT DETECTED NOT DETECTED Final    Streptococcus species NOT DETECTED NOT DETECTED Final   Streptococcus agalactiae NOT DETECTED NOT DETECTED Final   Streptococcus pneumoniae NOT DETECTED NOT DETECTED Final   Streptococcus pyogenes NOT DETECTED NOT DETECTED Final   A.calcoaceticus-baumannii NOT DETECTED NOT DETECTED Final   Bacteroides fragilis NOT DETECTED NOT DETECTED Final   Enterobacterales NOT DETECTED NOT DETECTED Final   Enterobacter cloacae complex NOT DETECTED NOT DETECTED Final   Escherichia coli NOT DETECTED NOT DETECTED Final   Klebsiella aerogenes NOT DETECTED NOT DETECTED Final   Klebsiella oxytoca NOT DETECTED NOT DETECTED Final   Klebsiella pneumoniae NOT DETECTED NOT DETECTED Final   Proteus species NOT DETECTED NOT DETECTED Final   Salmonella species NOT DETECTED NOT DETECTED Final   Serratia marcescens NOT DETECTED NOT DETECTED Final   Haemophilus influenzae NOT DETECTED NOT DETECTED Final   Neisseria meningitidis NOT DETECTED NOT DETECTED Final   Pseudomonas aeruginosa  NOT DETECTED NOT DETECTED Final   Stenotrophomonas maltophilia NOT DETECTED NOT DETECTED Final   Candida albicans NOT DETECTED NOT DETECTED Final   Candida auris NOT DETECTED NOT DETECTED Final   Candida glabrata NOT DETECTED NOT DETECTED Final   Candida krusei NOT DETECTED NOT DETECTED Final   Candida parapsilosis NOT DETECTED NOT DETECTED Final   Candida tropicalis NOT DETECTED NOT DETECTED Final   Cryptococcus neoformans/gattii NOT DETECTED NOT DETECTED Final   Methicillin resistance mecA/C DETECTED (A) NOT DETECTED Final    Comment: CRITICAL RESULT CALLED TO, READ BACK BY AND VERIFIED WITH: SHEEMA HALLAJI AT 1206 12/06/22.PMF    Meth resistant mecA/C and MREJ DETECTED (A) NOT DETECTED Final    Comment: CRITICAL RESULT CALLED TO, READ BACK BY AND VERIFIED WITH: SHEEMA HALLAJI AT 1206 12/06/22.PMF Performed at [facility], [address] [place], [place] [address]   Resp panel by RT-PCR (RSV, Flu A&B, Covid) Anterior  Nasal Swab     Status: None   Collection Time: 12/05/22  9:53 PM   Specimen: Anterior Nasal Swab  Result Value Ref Range Status   SARS Coronavirus 2 by RT PCR NEGATIVE NEGATIVE Final    Comment: (NOTE) SARS-CoV-2 target nucleic acids are NOT DETECTED.  The SARS-CoV-2 RNA is generally detectable in upper respiratory specimens during the acute phase of infection. The lowest concentration of SARS-CoV-2 viral copies this assay can detect is 138 copies/mL. A negative result does not preclude SARS-Cov-2 infection and should not be used as the sole basis for treatment or other patient management decisions. A negative result may occur with  improper specimen collection/handling, submission of specimen other than nasopharyngeal swab, presence of viral mutation(s) within the areas targeted by this assay, and inadequate number of viral copies(<138 copies/mL). A negative result must be combined with clinical observations, patient history, and epidemiological information. The expected result is Negative.  Fact Sheet for Patients:  BloggerCourse.com  Fact Sheet for Healthcare Providers:  SeriousBroker.it  This test is no t yet approved or cleared by the [place] FDA and  has been authorized for detection and/or diagnosis of SARS-CoV-2 by FDA under an Emergency Use Authorization (EUA). This EUA will remain  in effect (meaning this test can be used) for the duration of the COVID-19 declaration under Section 564(b)(1) of the Act, 21 U.S.C.section 360bbb-3(b)(1), unless the authorization is terminated  or revoked sooner.       Influenza A by PCR NEGATIVE NEGATIVE Final   Influenza B by PCR NEGATIVE NEGATIVE Final    Comment: (NOTE) The Xpert Xpress SARS-CoV-2/FLU/RSV plus assay is intended as an aid in the diagnosis of influenza from Nasopharyngeal swab specimens and should not be used as a sole basis for treatment. Nasal washings  and aspirates are unacceptable for Xpert Xpress SARS-CoV-2/FLU/RSV testing.  Fact Sheet for Patients: BloggerCourse.com  Fact Sheet for Healthcare Providers: SeriousBroker.it  This test is not yet approved or cleared by the [place] FDA and has been authorized for detection and/or diagnosis of SARS-CoV-2 by FDA under an Emergency Use Authorization (EUA). This EUA will remain in effect (meaning this test can be used) for the duration of the COVID-19 declaration under Section 564(b)(1) of the Act, 21 U.S.C. section 360bbb-3(b)(1), unless the authorization is terminated or revoked.     Resp Syncytial Virus by PCR NEGATIVE NEGATIVE Final    Comment: (NOTE) Fact Sheet for Patients: BloggerCourse.com  Fact Sheet for Healthcare Providers: SeriousBroker.it  This test is not yet approved or cleared by the  [place] [employer] trader and has been authorized for detection and/or diagnosis of SARS-CoV-2 by FDA under an TEFL teacher (EUA). This EUA will remain in effect (meaning this test can be used) for the duration of the COVID-19 declaration under Section 564(b)(1) of the Act, 21 U.S.C. section 360bbb-3(b)(1), unless the authorization is terminated or revoked.  Performed at [facility], [address] [place], [place] [address]   MRSA Next Gen by PCR, Nasal     Status: Abnormal   Collection Time: 12/06/22  4:27 AM   Specimen: Nasal Mucosa; Nasal Swab  Result Value Ref Range Status   MRSA by PCR Next Gen DETECTED (A) NOT DETECTED Final    Comment: RESULT CALLED TO, READ BACK BY AND VERIFIED WITH: KRISTINE CHAMBERS AT 2595 12/06/22.PMF (NOTE) The GeneXpert MRSA Assay (FDA approved for NASAL specimens only), is one component of a comprehensive MRSA colonization surveillance program. It is not intended to diagnose MRSA infection nor to guide or monitor treatment  for MRSA infections. Test performance is not FDA approved in patients less than 77 years old. Performed at [facility], [address] [place], [place] [address]   Cath Tip Culture     Status: Abnormal   Collection Time: 12/06/22  2:22 PM   Specimen: Catheter Tip; Other  Result Value Ref Range Status   Specimen Description   Final    CATH TIP Performed at [facility], [address] [place], [place] [address]    Special Requests   Final    NONE Performed at [facility], [address] [place], [place] [address]    Culture (A)  Final    >=100,000 COLONIES/mL METHICILLIN RESISTANT STAPHYLOCOCCUS AUREUS   Report Status 12/08/2022 FINAL  Final   Organism ID, Bacteria METHICILLIN RESISTANT STAPHYLOCOCCUS AUREUS (A)  Final      Susceptibility   Methicillin resistant staphylococcus aureus - MIC*    CIPROFLOXACIN >=8 RESISTANT Resistant     ERYTHROMYCIN >=8 RESISTANT Resistant     GENTAMICIN <=0.5 SENSITIVE Sensitive     OXACILLIN >=4 RESISTANT Resistant     TETRACYCLINE <=1 SENSITIVE Sensitive     VANCOMYCIN <=0.5 SENSITIVE Sensitive     TRIMETH/SULFA >=320 RESISTANT Resistant     CLINDAMYCIN <=0.25 SENSITIVE Sensitive     RIFAMPIN <=0.5 SENSITIVE Sensitive     Inducible Clindamycin NEGATIVE Sensitive     LINEZOLID 2 SENSITIVE Sensitive     * >=100,000 COLONIES/mL METHICILLIN RESISTANT STAPHYLOCOCCUS AUREUS  Urine Culture (for pregnant, neutropenic or urologic patients or patients with an indwelling urinary catheter)     Status: Abnormal   Collection Time: 12/06/22  5:41 PM   Specimen: Urine, Clean Catch  Result Value Ref Range Status   Specimen Description   Final    URINE, CLEAN CATCH Performed at [facility], [address], [place], [place] [address]    Special Requests   Final    Normal Performed at [facility], [address] [place], [place] [address]    Culture (A)  Final    70,000 COLONIES/mL ENTEROCOCCUS  FAECALIS 70,000 COLONIES/mL YEAST    Report Status 12/09/2022 FINAL  Final   Organism ID, Bacteria ENTEROCOCCUS FAECALIS (A)  Final      Susceptibility   Enterococcus faecalis - MIC*    AMPICILLIN <=2 SENSITIVE Sensitive     NITROFURANTOIN <=16 SENSITIVE Sensitive     VANCOMYCIN 1 SENSITIVE Sensitive     * 70,000 COLONIES/mL ENTEROCOCCUS FAECALIS  Culture,  blood (Routine X 2) w Reflex to ID Panel     Status: None   Collection Time: 12/07/22  1:31 PM   Specimen: BLOOD  Result Value Ref Range Status   Specimen Description BLOOD BLOOD RIGHT HAND  Final   Special Requests   Final    BOTTLES DRAWN AEROBIC ONLY Blood Culture adequate volume   Culture   Final    NO GROWTH 5 DAYS Performed at [facility], [address] [place], [place] [address]    Report Status 12/12/2022 FINAL  Final  Culture, blood (Routine X 2) w Reflex to ID Panel     Status: None   Collection Time: 12/07/22  6:41 PM   Specimen: BLOOD  Result Value Ref Range Status   Specimen Description BLOOD BLOOD RIGHT HAND  Final   Special Requests   Final    BOTTLES DRAWN AEROBIC AND ANAEROBIC Blood Culture results may not be optimal due to an inadequate volume of blood received in culture bottles   Culture   Final    NO GROWTH 5 DAYS Performed at [facility], [address] [place], [place] [address]    Report Status 12/12/2022 FINAL  Final  C Difficile Quick Screen (NO PCR Reflex)     Status: None   Collection Time: 12/12/22  4:07 PM   Specimen: STOOL  Result Value Ref Range Status   C Diff antigen NEGATIVE NEGATIVE Final   C Diff toxin NEGATIVE NEGATIVE Final   C Diff interpretation No C. difficile detected.  Final    Comment: Performed at [facility], [address] [place], [place] [address]  Gastrointestinal Panel by PCR , Stool     Status: None   Collection Time: 12/13/22  8:38 AM   Specimen: Stool  Result Value Ref Range Status   Campylobacter species NOT DETECTED NOT  DETECTED Final   Plesimonas shigelloides NOT DETECTED NOT DETECTED Final   Salmonella species NOT DETECTED NOT DETECTED Final   Yersinia enterocolitica NOT DETECTED NOT DETECTED Final   Vibrio species NOT DETECTED NOT DETECTED Final   Vibrio cholerae NOT DETECTED NOT DETECTED Final   Enteroaggregative E coli (EAEC) NOT DETECTED NOT DETECTED Final   Enteropathogenic E coli (EPEC) NOT DETECTED NOT DETECTED Final   Enterotoxigenic E coli (ETEC) NOT DETECTED NOT DETECTED Final   Shiga like toxin producing E coli (STEC) NOT DETECTED NOT DETECTED Final   Shigella/Enteroinvasive E coli (EIEC) NOT DETECTED NOT DETECTED Final   Cryptosporidium NOT DETECTED NOT DETECTED Final   Cyclospora cayetanensis NOT DETECTED NOT DETECTED Final   Entamoeba histolytica NOT DETECTED NOT DETECTED Final   Giardia lamblia NOT DETECTED NOT DETECTED Final   Adenovirus F40/41 NOT DETECTED NOT DETECTED Final   Astrovirus NOT DETECTED NOT DETECTED Final   Norovirus GI/GII NOT DETECTED NOT DETECTED Final   Rotavirus A NOT DETECTED NOT DETECTED Final   Sapovirus (I, II, IV, and V) NOT DETECTED NOT DETECTED Final    Comment: Performed at [facility], [address] [place], [place] [address]    Coagulation Studies: No results for input(s): "LABPROT", "INR" in the last 72 hours.   Urinalysis: No results for input(s): "COLORURINE", "LABSPEC", "PHURINE", "GLUCOSEU", "HGBUR", "BILIRUBINUR", "KETONESUR", "PROTEINUR", "UROBILINOGEN", "NITRITE", "LEUKOCYTESUR" in the last 72 hours.  Invalid input(s): "APPERANCEUR"     Imaging: DG ABD ACUTE 2+V W 1V CHEST  Result Date: 12/25/2022 CLINICAL DATA:  Nausea/vomiting EXAM: DG ABDOMEN ACUTE WITH 1 VIEW CHEST COMPARISON:  Chest radiograph  dated 12/12/2022 FINDINGS: Interval placement of a left IJ dual lumen dialysis catheter with its distal tip in the upper right atrium. Mild left lower lobe atelectasis. Right lung is clear. No frank interstitial edema. No pleural  effusion or pneumothorax. Cardiomegaly. Nonobstructive bowel gas pattern. IVC filter. IMPRESSION: Interval placement of a left IJ dual lumen dialysis catheter with its distal tip in the upper right atrium. Cardiomegaly.  Mild left lower lobe atelectasis. Nonobstructive bowel gas pattern. Electronically Signed   By: Charline Bills M.D.   On: 12/25/2022 21:11     Medications:    sodium chloride Stopped (12/15/22 0035)   sodium chloride Stopped (12/08/22 1823)   anticoagulant sodium citrate     DAPTOmycin (CUBICIN) 500 mg in sodium chloride 0.9 % IVPB 500 mg (12/26/22 1147)    sodium chloride   Intravenous Once   amiodarone  200 mg Per Tube BID   ascorbic acid  500 mg Per Tube BID   atorvastatin  40 mg Per Tube Daily   chlorhexidine  15 mL Mouth Rinse BID   Chlorhexidine Gluconate Cloth  6 each Topical Q0600   vitamin D3  2,000 Units Per Tube Daily   copper  2 mg Per Tube Daily   droxidopa  300 mg Per Tube TID WC   epoetin (EPOGEN/PROCRIT) injection  10,000 Units Intravenous Q T,Th,Sa-HD   escitalopram  10 mg Per Tube Daily   famotidine  10 mg Per Tube Daily   feeding supplement (NEPRO CARB STEADY)  1,120 mL Per Tube Q24H   fludrocortisone  0.2 mg Per Tube Daily   free water  50 mL Per Tube Q4H   heparin injection (subcutaneous)  5,000 Units Subcutaneous Q8H   hydrocortisone cream   Topical TID   insulin aspart  0-6 Units Subcutaneous Q4H   ipratropium-albuterol  3 mL Nebulization QID   midodrine  20 mg Per Tube TID WC   multivitamin  1 tablet Per Tube QHS   mupirocin ointment   Nasal BID   nutrition supplement (JUVEN)  1 packet Per Tube BID BM   nystatin   Topical TID   mouth rinse  15 mL Mouth Rinse 4 times per day   phenylephrine  10 mg Per J Tube BID   sodium chloride, acetaminophen **OR** acetaminophen, alteplase, alteplase, anticoagulant sodium citrate, artificial tears, clonazePAM, diphenhydrAMINE, fentaNYL (SUBLIMAZE) injection, heparin, hydrOXYzine, lidocaine (PF),  lidocaine-prilocaine, loperamide HCl, magnesium hydroxide, ondansetron **OR** ondansetron (ZOFRAN) IV, mouth rinse, pentafluoroprop-tetrafluoroeth, sennosides, traZODone  Assessment/ Plan:  [name] is a 72 y.o.  female with past medical history of diabetes, COPD, GERD, hypertension. OA, a fib, and end stage renal disease on hemodialysis. Patient presents to the ED from her nursing facility complaining of altered mental status. She has been admitted for Lower urinary tract infectious disease [N39.0] Altered mental status, unspecified altered mental status type [R41.82] Sepsis due to gram-negative UTI (HCC) [A41.50, N39.0] Sepsis, due to unspecified organism, unspecified whether acute organ dysfunction present (HCC) [A41.9]  [facility] [place]/TTS/Rt Permcath/? 81.3kg  End stage renal disease -Patient had hemodialysis treatment yesterday.  No acute indication for dialysis treatment today.  We will plan for treatment again tomorrow.  2. Hypotension due to sepsis. Blood cultures and dialysis cath tip cultures with MRSA, Enterococcus (12/06/2022). Repeat cultures (12/07/2022) with no growth.   - Received pressor support during this admission.   - Continue daptomycin as per ID recommendations.  Recommendations include Daptomycin 500mg  with dialysis until 01/03/23. Outpatient dialysis clinic notified.  3. Anemia of chronic kidney disease : PRBC transfusion on 8/26, 9/3  -Maintain the patient on Epogen.  Continue to periodically monitor CBC.   4. Secondary Hyperparathyroidism:  with hypophosphatemia.   -Serum phosphorus acceptable at 3.3 at last check.  5.  Hypokalemia  -Most recent serum potassium was acceptable at 3.9.     LOS: 20 Audine Mangione 9/13/20242:12 PM

## 2022-12-26 NOTE — Plan of Care (Signed)

## 2022-12-26 NOTE — Assessment & Plan Note (Signed)
Severe dysphagia secondary to prolonged trach.  Has been on tube feeding since then.  Dietitian spoke with patient's daughter to resume continuous tube feedings at night.

## 2022-12-26 NOTE — Plan of Care (Signed)

## 2022-12-26 NOTE — Progress Notes (Addendum)
Nutrition Follow-up  DOCUMENTATION CODES:   Obesity unspecified  INTERVENTION:   Change to nocturnal tube feeds:  Nepro @70ml /hr x 16 hrs (from 1800-1000)  Free water flushes 50ml q4 hours to maintain tube patency   Regimen provides 2016kcal/day, 91g/day protein and 1141ml/day of free water.   Rena-vit daily via tube   Juven Fruit Punch BID via tube, each serving provides 95kcal and 2.5g of protein (amino acids glutamine and arginine)  Daily weights   NUTRITION DIAGNOSIS:   Inadequate oral intake related to dysphagia as evidenced by NPO status (pt with chronic G-tube). -ongoing  GOAL:   Patient will meet greater than or equal to 90% of their needs -met  MONITOR:   Labs, Weight trends, TF tolerance, Skin, I & O's  ASSESSMENT:   72 y.o. female with a history of asthma/COPD, HTN, DMII, obesity, OSA on CPAP, SDH, GERD, ESRD on HD, afib, anxiety, depression, possible cirrhosis and a lengthy admission (04/2021) for necrotizing fasciitis, bacteremia and sepsis ( requiring L AKA, tracheostomy and G-tube placement) and who is now admitted with acute metabolic encephalopathy in setting of severe sepsis with septic shock due to MRSA & staph epi bacteremia (suspected from right chest permcath) and UTI.  Pt changed to bolus feeds per family request on 9/10. Pt noted to have an episode of nausea/vomiting yesterday; KUB within normal limits. Pt with long suspected gastroparesis. Pt has vomited twice this admission. Pt with chronic intermittent nausea/vomiting. Pt had intermittent nausea/vomiting during a lengthy admission in 2023 where she required erythromycin. Per SNF, pt with intermittent nausea and vomiting at the facility pta. Pt previously unable to tolerate bolus feeds and was changed to nocturnal feeds at SNF. Nepro is high in fiber and will already digest more slowly. Pt placed on Nepro for volume restriction secondary to ongoing hyponatremia and chronic HD. Spoke with pt's daughter  via phone, discussed the recommendation to change pt back to nocturnal feeds; daughter is in agreement. Pt with dysphagia and is NPO at baseline; pt relies 100% on enteral nutrition to meet her estimated needs and will need to receive all five cartons of Nepro daily. Refeed labs stable. Per chart, pt is up ~12lbs since admission; pt continues on HD. Plan is for possible discharge on Monday.  Of note, pt with copper deficiency. Copper was supplemented in hospital. Spoke with daughter, recommended for PCP to recheck copper labs at her next appointment in October.      Medications reviewed and include: vitamin C, vitamin D, epoetin, pepcid, heparin, insulin, midodrine, rena-vit, daptomycin   Labs reviewed: Na 132(L), K 3.9 wnl, BUN 25(H), creat 1.76(H), P 3.3 wnl Iron 45, TIBC 227(L), ferritin 1642(H), transferrin 159(L), folate 20.7- 8/30 Copper 29(L), vitamin D 34.20, vitamin A 24.9, vitamin C 1.0, vitamin E 11.6, zinc 55- 8/26 Wbc- 16.9(L), Hgb 9.0(L), Hct 30.2(L) Cbgs- 145, 96, 83, 111 x 24 hrs   UOP-   Diet Order:   Diet Order             Diet NPO time specified  Diet effective midnight                  EDUCATION NEEDS:   No education needs have been identified at this time  Skin:  Skin Assessment: Skin Integrity Issues: Skin Integrity Issues:: Stage II Stage II: L/R buttocks  Last BM:  9/13- type 6  Height:   Ht Readings from Last 1 Encounters:  12/05/22 5\' 4"  (1.626 m)    Weight:  Wt Readings from Last 1 Encounters:  12/25/22 90.3 kg   BMI:  Body mass index is 34.16 kg/m.  Estimated Nutritional Needs:   Kcal:  1800-2100kcal/day  Protein:  90-105g/day  Fluid:  UOP +1L  Betsey Holiday MS, RD, LDN Please refer to [facility] for RD and/or RD on-call/weekend/after hours pager

## 2022-12-26 NOTE — TOC Progression Note (Signed)
Transition of Care [facility]) - Progression Note    Patient Details  Name: Alexandra Foster MRN: [id number] Date of Birth: 09-Dec-1950  [facility] CM/SW Contact  Marlowe Sax, RN Phone Number: 12/26/2022, 2:21 PM  Clinical Narrative:     Met with the patient and her daughter in the room to discuss dc plan an needs, I notified Mitch with [employer] that they would like to hear from [employer] about delivering equipment to the house that they have the room all cleaned out waiting on delivery and that they need the DME to be able to go home, I explained that [employer] stated that they would be calling to make arrangements, I called pam with [employer] about the Tube feeding and am waiting to hear back from her about the teaching  Expected Discharge Plan: Home w Home Health Services Barriers to Discharge: Continued Medical Work up  Expected Discharge Plan and Services     Post Acute Care Choice: Home Health Living arrangements for the past 2 months: Skilled Nursing Facility                           HH Arranged: PT, OT, RN [facility] Agency: [facility] Date HH Agency Contacted: 12/24/22 Time HH Agency Contacted: [id number] Representative spoke with at [facility] Agency: Barbara Cower   Social Determinants of Health (SDOH) Interventions SDOH Screenings   Food Insecurity: Patient Unable To Answer (12/06/2022)  Housing: High Risk (12/06/2022)  Transportation Needs: No Transportation Needs (12/06/2022)  Utilities: Not At Risk (12/06/2022)  Alcohol Screen: Low Risk  (02/11/2021)  Depression (PHQ2-9): Low Risk  (04/30/2021)  Financial Resource Strain: Medium Risk (08/09/2020)  Physical Activity: Inactive (08/09/2020)  Social Connections: Moderately Integrated (08/09/2020)  Stress: No Stress Concern Present (08/09/2020)  Tobacco Use: Medium Risk (12/05/2022)    Readmission Risk Interventions    12/26/2022   11:15 AM  Readmission Risk Prevention Plan  Transportation Screening Complete   Medication Review (RN Care Manager) Complete  PCP or Specialist appointment within 3-5 days of discharge Complete  HRI or Home Care Consult Complete  SW Recovery Care/Counseling Consult Complete  Palliative Care Screening Not Applicable  Skilled Nursing Facility Not Applicable

## 2022-12-27 DIAGNOSIS — A419 Sepsis, unspecified organism: Secondary | ICD-10-CM | POA: Diagnosis not present

## 2022-12-27 DIAGNOSIS — G9341 Metabolic encephalopathy: Secondary | ICD-10-CM | POA: Diagnosis not present

## 2022-12-27 DIAGNOSIS — N186 End stage renal disease: Secondary | ICD-10-CM | POA: Diagnosis not present

## 2022-12-27 DIAGNOSIS — I480 Paroxysmal atrial fibrillation: Secondary | ICD-10-CM | POA: Diagnosis not present

## 2022-12-27 LAB — CBC
HCT: 33 % — ABNORMAL LOW (ref 36.0–46.0)
Hemoglobin: 10.2 g/dL — ABNORMAL LOW (ref 12.0–15.0)
MCH: 27.5 pg (ref 26.0–34.0)
MCHC: 30.9 g/dL (ref 30.0–36.0)
MCV: 88.9 fL (ref 80.0–100.0)
Platelets: 485 10*3/uL — ABNORMAL HIGH (ref 150–400)
RBC: 3.71 MIL/uL — ABNORMAL LOW (ref 3.87–5.11)
RDW: 20.9 % — ABNORMAL HIGH (ref 11.5–15.5)
WBC: 13.6 10*3/uL — ABNORMAL HIGH (ref 4.0–10.5)
nRBC: 0.4 % — ABNORMAL HIGH (ref 0.0–0.2)

## 2022-12-27 LAB — RENAL FUNCTION PANEL
Albumin: 2.5 g/dL — ABNORMAL LOW (ref 3.5–5.0)
Anion gap: 9 (ref 5–15)
BUN: 40 mg/dL — ABNORMAL HIGH (ref 8–23)
CO2: 28 mmol/L (ref 22–32)
Calcium: 8.9 mg/dL (ref 8.9–10.3)
Chloride: 96 mmol/L — ABNORMAL LOW (ref 98–111)
Creatinine, Ser: 2.56 mg/dL — ABNORMAL HIGH (ref 0.44–1.00)
GFR, Estimated: 19 mL/min — ABNORMAL LOW (ref >60)
Glucose, Bld: 135 mg/dL — ABNORMAL HIGH (ref 70–99)
Phosphorus: 4.4 mg/dL (ref 2.5–4.6)
Potassium: 3.4 mmol/L — ABNORMAL LOW (ref 3.5–5.1)
Sodium: 133 mmol/L — ABNORMAL LOW (ref 135–145)

## 2022-12-27 LAB — GLUCOSE, CAPILLARY
Glucose-Capillary: 126 mg/dL — ABNORMAL HIGH (ref 70–99)
Glucose-Capillary: 136 mg/dL — ABNORMAL HIGH (ref 70–99)
Glucose-Capillary: 141 mg/dL — ABNORMAL HIGH (ref 70–99)
Glucose-Capillary: 141 mg/dL — ABNORMAL HIGH (ref 70–99)
Glucose-Capillary: 147 mg/dL — ABNORMAL HIGH (ref 70–99)

## 2022-12-27 MED ORDER — EPOETIN ALFA 10000 UNIT/ML IJ SOLN
INTRAMUSCULAR | Status: AC
  Filled 2022-12-27: qty 1

## 2022-12-27 MED ORDER — DROXIDOPA 100 MG PO CAPS
200.0000 mg | ORAL_CAPSULE | Freq: Three times a day (TID) | ORAL | Status: DC
  Administered 2022-12-28 (×3): 200 mg
  Filled 2022-12-27 (×4): qty 2

## 2022-12-27 NOTE — Plan of Care (Signed)

## 2022-12-27 NOTE — Progress Notes (Signed)
[facility]  ROUNDING NOTE   Subjective:   Laying in bed. Somnolent.  Dialysis for later today.   Objective:  Vital signs in last 24 hours:  Temp:  [98 F (36.7 C)-99 F (37.2 C)] 98.4 F (36.9 C) (09/14 0845) Pulse Rate:  [65-68] 66 (09/14 0845) Resp:  [16-18] 16 (09/14 0845) BP: (107-123)/(48-55) 123/48 (09/14 0845) SpO2:  [95 %-100 %] 95 % (09/14 0845)  Weight change:  Filed Weights   12/23/22 0855 12/23/22 1250 12/25/22 0500  Weight: 85 kg 84.4 kg 90.3 kg    Intake/Output: I/O last 3 completed shifts: In: 0  Out: 100 [Urine:100]   Intake/Output this shift:  No intake/output data recorded.  Physical Exam: General: Ill appearing  Head: Normocephalic, atraumatic. Moist oral mucosal membranes  Eyes: Anicteric  Lungs:  Diminished bilaterally   Heart: regular   Abdomen:  Soft, nontender  Extremities: 1+ peripheral edema.  Neurologic: Somnolent  Access:  LT permcath placed on 12/19/22    Basic Metabolic Panel: Recent Labs  Lab 12/23/22 0616 12/24/22 0553 12/25/22 0438 12/26/22 0425 12/27/22 0448  NA 139 137 136 132* 133*  K 3.2* 3.7 4.0 3.9 3.4*  CL 103 101 97* 95* 96*  CO2 28 29 28 28 28   GLUCOSE 166* 130* 88 86 135*  BUN 81* 35* 51* 25* 40*  CREATININE 2.24* 1.86* 2.31* 1.76* 2.56*  CALCIUM 9.1 8.7* 9.2 8.8* 8.9  PHOS 3.5 2.7 4.1 3.3 4.4    Liver Function Tests: Recent Labs  Lab 12/23/22 0616 12/24/22 0553 12/25/22 0438 12/26/22 0425 12/27/22 0448  ALBUMIN 2.7* 2.4* 2.6* 2.6* 2.5*   No results for input(s): "LIPASE", "AMYLASE" in the last 168 hours. No results for input(s): "AMMONIA" in the last 168 hours.  CBC: Recent Labs  Lab 12/21/22 0345 12/23/22 0927  WBC 17.0* 16.9*  HGB 8.8* 9.0*  HCT 28.8* 30.2*  MCV 88.3 91.2  PLT 483* 456*    Cardiac Enzymes: Recent Labs  Lab 12/26/22 0425  CKTOTAL 15*    BNP: Invalid input(s): "POCBNP"  CBG: Recent Labs  Lab 12/26/22 1619 12/26/22 2036 12/27/22 0001  12/27/22 0453 12/27/22 0845  GLUCAP 104* 133* 141* 141* 147*    Microbiology: Results for orders placed or performed during the hospital encounter of 12/05/22  Culture, blood (Routine x 2)     Status: Abnormal   Collection Time: 12/05/22  8:56 PM   Specimen: BLOOD  Result Value Ref Range Status   Specimen Description   Final    BLOOD BLOOD RIGHT ARM Performed at [facility], [address] [place], [place] [address]    Special Requests   Final    BOTTLES DRAWN AEROBIC AND ANAEROBIC Blood Culture adequate volume Performed at [facility], [address] [place], [place] [address]    Culture  Setup Time   Final    GRAM POSITIVE COCCI IN BOTH AEROBIC AND ANAEROBIC BOTTLES CRITICAL RESULT CALLED TO, READ BACK BY AND VERIFIED WITH: SHEEMA HALLLAJI AT 1206 12/06/22.PMF Performed at [facility], [address] [place], [place] [address]    Culture (A)  Final    STAPHYLOCOCCUS AUREUS SUSCEPTIBILITIES PERFORMED ON PREVIOUS CULTURE WITHIN THE LAST 5 DAYS. Performed at [facility], [address] [place], [place] [address]    Report Status 12/08/2022 FINAL  Final  Culture, blood (Routine x 2)     Status: Abnormal   Collection Time: 12/05/22  8:57 PM   Specimen: BLOOD  Result Value  Ref Range Status   Specimen Description   Final    BLOOD BLOOD LEFT ARM Performed at [facility], [address] [place], [place] [address]    Special Requests   Final    BOTTLES DRAWN AEROBIC AND ANAEROBIC Blood Culture adequate volume Performed at [facility], [address] [place], [place] [address]    Culture  Setup Time   Final    GRAM POSITIVE COCCI IN BOTH AEROBIC AND ANAEROBIC BOTTLES CRITICAL RESULT CALLED TO, READ BACK BY AND VERIFIED WITH: [facility] HALLAJI AT 1206 12/06/22.PMF Performed at [facility], [address], [place], [place] [address]    Culture METHICILLIN RESISTANT STAPHYLOCOCCUS AUREUS (A)  Final   Report  Status 12/08/2022 FINAL  Final   Organism ID, Bacteria METHICILLIN RESISTANT STAPHYLOCOCCUS AUREUS  Final      Susceptibility   Methicillin resistant staphylococcus aureus - MIC*    CIPROFLOXACIN >=8 RESISTANT Resistant     ERYTHROMYCIN >=8 RESISTANT Resistant     GENTAMICIN <=0.5 SENSITIVE Sensitive     OXACILLIN >=4 RESISTANT Resistant     TETRACYCLINE <=1 SENSITIVE Sensitive     VANCOMYCIN <=0.5 SENSITIVE Sensitive     TRIMETH/SULFA >=320 RESISTANT Resistant     CLINDAMYCIN <=0.25 SENSITIVE Sensitive     RIFAMPIN <=0.5 SENSITIVE Sensitive     Inducible Clindamycin NEGATIVE Sensitive     LINEZOLID 2 SENSITIVE Sensitive     * METHICILLIN RESISTANT STAPHYLOCOCCUS AUREUS  Blood Culture ID Panel (Reflexed)     Status: Abnormal   Collection Time: 12/05/22  8:57 PM  Result Value Ref Range Status   Enterococcus faecalis NOT DETECTED NOT DETECTED Final   Enterococcus Faecium NOT DETECTED NOT DETECTED Final   Listeria monocytogenes NOT DETECTED NOT DETECTED Final   Staphylococcus species DETECTED (A) NOT DETECTED Final    Comment: CRITICAL RESULT CALLED TO, READ BACK BY AND VERIFIED WITH: SHEEMA HALLAJI AT 1206 12/06/22.PMF    Staphylococcus aureus (BCID) DETECTED (A) NOT DETECTED Final    Comment: Methicillin (oxacillin)-resistant Staphylococcus aureus (MRSA). MRSA is predictably resistant to beta-lactam antibiotics (except ceftaroline). Preferred therapy is vancomycin unless clinically contraindicated. Patient requires contact precautions if  hospitalized. CRITICAL RESULT CALLED TO, READ BACK BY AND VERIFIED WITH: SHEEMA HALLAJI AT 1206 12/06/22.PMF    Staphylococcus epidermidis DETECTED (A) NOT DETECTED Final    Comment: CRITICAL RESULT CALLED TO, READ BACK BY AND VERIFIED WITH: SHEEMA HALLAJI AT 1206 12/06/22.PMF    Staphylococcus lugdunensis NOT DETECTED NOT DETECTED Final   Streptococcus species NOT DETECTED NOT DETECTED Final   Streptococcus agalactiae NOT DETECTED NOT DETECTED  Final   Streptococcus pneumoniae NOT DETECTED NOT DETECTED Final   Streptococcus pyogenes NOT DETECTED NOT DETECTED Final   A.calcoaceticus-baumannii NOT DETECTED NOT DETECTED Final   Bacteroides fragilis NOT DETECTED NOT DETECTED Final   Enterobacterales NOT DETECTED NOT DETECTED Final   Enterobacter cloacae complex NOT DETECTED NOT DETECTED Final   Escherichia coli NOT DETECTED NOT DETECTED Final   Klebsiella aerogenes NOT DETECTED NOT DETECTED Final   Klebsiella oxytoca NOT DETECTED NOT DETECTED Final   Klebsiella pneumoniae NOT DETECTED NOT DETECTED Final   Proteus species NOT DETECTED NOT DETECTED Final   Salmonella species NOT DETECTED NOT DETECTED Final   Serratia marcescens NOT DETECTED NOT DETECTED Final   Haemophilus influenzae NOT DETECTED NOT DETECTED Final   Neisseria meningitidis NOT DETECTED NOT DETECTED Final   Pseudomonas aeruginosa NOT DETECTED NOT DETECTED Final   Stenotrophomonas maltophilia NOT DETECTED NOT DETECTED  Final   Candida albicans NOT DETECTED NOT DETECTED Final   Candida auris NOT DETECTED NOT DETECTED Final   Candida glabrata NOT DETECTED NOT DETECTED Final   Candida krusei NOT DETECTED NOT DETECTED Final   Candida parapsilosis NOT DETECTED NOT DETECTED Final   Candida tropicalis NOT DETECTED NOT DETECTED Final   Cryptococcus neoformans/gattii NOT DETECTED NOT DETECTED Final   Methicillin resistance mecA/C DETECTED (A) NOT DETECTED Final    Comment: CRITICAL RESULT CALLED TO, READ BACK BY AND VERIFIED WITH: SHEEMA HALLAJI AT 1206 12/06/22.PMF    Meth resistant mecA/C and MREJ DETECTED (A) NOT DETECTED Final    Comment: CRITICAL RESULT CALLED TO, READ BACK BY AND VERIFIED WITH: SHEEMA HALLAJI AT 1206 12/06/22.PMF Performed at [facility], [address] [place], [place] [address]   Resp panel by RT-PCR (RSV, Flu A&B, Covid) Anterior Nasal Swab     Status: None   Collection Time: 12/05/22  9:53 PM   Specimen: Anterior Nasal Swab  Result  Value Ref Range Status   SARS Coronavirus 2 by RT PCR NEGATIVE NEGATIVE Final    Comment: (NOTE) SARS-CoV-2 target nucleic acids are NOT DETECTED.  The SARS-CoV-2 RNA is generally detectable in upper respiratory specimens during the acute phase of infection. The lowest concentration of SARS-CoV-2 viral copies this assay can detect is 138 copies/mL. A negative result does not preclude SARS-Cov-2 infection and should not be used as the sole basis for treatment or other patient management decisions. A negative result may occur with  improper specimen collection/handling, submission of specimen other than nasopharyngeal swab, presence of viral mutation(s) within the areas targeted by this assay, and inadequate number of viral copies(<138 copies/mL). A negative result must be combined with clinical observations, patient history, and epidemiological information. The expected result is Negative.  Fact Sheet for Patients:  BloggerCourse.com  Fact Sheet for Healthcare Providers:  SeriousBroker.it  This test is no t yet approved or cleared by the [place] FDA and  has been authorized for detection and/or diagnosis of SARS-CoV-2 by FDA under an Emergency Use Authorization (EUA). This EUA will remain  in effect (meaning this test can be used) for the duration of the COVID-19 declaration under Section 564(b)(1) of the Act, 21 U.S.C.section 360bbb-3(b)(1), unless the authorization is terminated  or revoked sooner.       Influenza A by PCR NEGATIVE NEGATIVE Final   Influenza B by PCR NEGATIVE NEGATIVE Final    Comment: (NOTE) The Xpert Xpress SARS-CoV-2/FLU/RSV plus assay is intended as an aid in the diagnosis of influenza from Nasopharyngeal swab specimens and should not be used as a sole basis for treatment. Nasal washings and aspirates are unacceptable for Xpert Xpress SARS-CoV-2/FLU/RSV testing.  Fact Sheet for  Patients: BloggerCourse.com  Fact Sheet for Healthcare Providers: SeriousBroker.it  This test is not yet approved or cleared by the [place] FDA and has been authorized for detection and/or diagnosis of SARS-CoV-2 by FDA under an Emergency Use Authorization (EUA). This EUA will remain in effect (meaning this test can be used) for the duration of the COVID-19 declaration under Section 564(b)(1) of the Act, 21 U.S.C. section 360bbb-3(b)(1), unless the authorization is terminated or revoked.     Resp Syncytial Virus by PCR NEGATIVE NEGATIVE Final    Comment: (NOTE) Fact Sheet for Patients: BloggerCourse.com  Fact Sheet for Healthcare Providers: SeriousBroker.it  This test is not yet approved or cleared by the [place] FDA and has been authorized for detection and/or diagnosis of SARS-CoV-2  by FDA under an Emergency Use Authorization (EUA). This EUA will remain in effect (meaning this test can be used) for the duration of the COVID-19 declaration under Section 564(b)(1) of the Act, 21 U.S.C. section 360bbb-3(b)(1), unless the authorization is terminated or revoked.  Performed at [facility], [address] [place], [place] [address]   MRSA Next Gen by PCR, Nasal     Status: Abnormal   Collection Time: 12/06/22  4:27 AM   Specimen: Nasal Mucosa; Nasal Swab  Result Value Ref Range Status   MRSA by PCR Next Gen DETECTED (A) NOT DETECTED Final    Comment: RESULT CALLED TO, READ BACK BY AND VERIFIED WITH: KRISTINE CHAMBERS AT 7858 12/06/22.PMF (NOTE) The GeneXpert MRSA Assay (FDA approved for NASAL specimens only), is one component of a comprehensive MRSA colonization surveillance program. It is not intended to diagnose MRSA infection nor to guide or monitor treatment for MRSA infections. Test performance is not FDA approved in patients less than 35  years old. Performed at [facility], [address], [place], [place] [address]   Cath Tip Culture     Status: Abnormal   Collection Time: 12/06/22  2:22 PM   Specimen: Catheter Tip; Other  Result Value Ref Range Status   Specimen Description   Final    CATH TIP Performed at [facility], [address] [place], [place] [address]    Special Requests   Final    NONE Performed at [facility], [address] [place], [place] [address]    Culture (A)  Final    >=100,000 COLONIES/mL METHICILLIN RESISTANT STAPHYLOCOCCUS AUREUS   Report Status 12/08/2022 FINAL  Final   Organism ID, Bacteria METHICILLIN RESISTANT STAPHYLOCOCCUS AUREUS (A)  Final      Susceptibility   Methicillin resistant staphylococcus aureus - MIC*    CIPROFLOXACIN >=8 RESISTANT Resistant     ERYTHROMYCIN >=8 RESISTANT Resistant     GENTAMICIN <=0.5 SENSITIVE Sensitive     OXACILLIN >=4 RESISTANT Resistant     TETRACYCLINE <=1 SENSITIVE Sensitive     VANCOMYCIN <=0.5 SENSITIVE Sensitive     TRIMETH/SULFA >=320 RESISTANT Resistant     CLINDAMYCIN <=0.25 SENSITIVE Sensitive     RIFAMPIN <=0.5 SENSITIVE Sensitive     Inducible Clindamycin NEGATIVE Sensitive     LINEZOLID 2 SENSITIVE Sensitive     * >=100,000 COLONIES/mL METHICILLIN RESISTANT STAPHYLOCOCCUS AUREUS  Urine Culture (for pregnant, neutropenic or urologic patients or patients with an indwelling urinary catheter)     Status: Abnormal   Collection Time: 12/06/22  5:41 PM   Specimen: Urine, Clean Catch  Result Value Ref Range Status   Specimen Description   Final    URINE, CLEAN CATCH Performed at [facility], [address] [place], [place] [address]    Special Requests   Final    Normal Performed at [facility], [address] [place], [place] [address]    Culture (A)  Final    70,000 COLONIES/mL ENTEROCOCCUS FAECALIS 70,000 COLONIES/mL YEAST    Report Status 12/09/2022 FINAL  Final    Organism ID, Bacteria ENTEROCOCCUS FAECALIS (A)  Final      Susceptibility   Enterococcus faecalis - MIC*    AMPICILLIN <=2 SENSITIVE Sensitive     NITROFURANTOIN <=16 SENSITIVE Sensitive     VANCOMYCIN 1 SENSITIVE Sensitive     * 70,000 COLONIES/mL ENTEROCOCCUS FAECALIS  Culture, blood (Routine X 2) w Reflex to ID Panel  Status: None   Collection Time: 12/07/22  1:31 PM   Specimen: BLOOD  Result Value Ref Range Status   Specimen Description BLOOD BLOOD RIGHT HAND  Final   Special Requests   Final    BOTTLES DRAWN AEROBIC ONLY Blood Culture adequate volume   Culture   Final    NO GROWTH 5 DAYS Performed at [facility], [address] [place], [place] [address]    Report Status 12/12/2022 FINAL  Final  Culture, blood (Routine X 2) w Reflex to ID Panel     Status: None   Collection Time: 12/07/22  6:41 PM   Specimen: BLOOD  Result Value Ref Range Status   Specimen Description BLOOD BLOOD RIGHT HAND  Final   Special Requests   Final    BOTTLES DRAWN AEROBIC AND ANAEROBIC Blood Culture results may not be optimal due to an inadequate volume of blood received in culture bottles   Culture   Final    NO GROWTH 5 DAYS Performed at [facility], [address] [place], [place] [address]    Report Status 12/12/2022 FINAL  Final  C Difficile Quick Screen (NO PCR Reflex)     Status: None   Collection Time: 12/12/22  4:07 PM   Specimen: STOOL  Result Value Ref Range Status   C Diff antigen NEGATIVE NEGATIVE Final   C Diff toxin NEGATIVE NEGATIVE Final   C Diff interpretation No C. difficile detected.  Final    Comment: Performed at [facility], [address] [place], [place] [address]  Gastrointestinal Panel by PCR , Stool     Status: None   Collection Time: 12/13/22  8:38 AM   Specimen: Stool  Result Value Ref Range Status   Campylobacter species NOT DETECTED NOT DETECTED Final   Plesimonas shigelloides NOT DETECTED NOT DETECTED Final    Salmonella species NOT DETECTED NOT DETECTED Final   Yersinia enterocolitica NOT DETECTED NOT DETECTED Final   Vibrio species NOT DETECTED NOT DETECTED Final   Vibrio cholerae NOT DETECTED NOT DETECTED Final   Enteroaggregative E coli (EAEC) NOT DETECTED NOT DETECTED Final   Enteropathogenic E coli (EPEC) NOT DETECTED NOT DETECTED Final   Enterotoxigenic E coli (ETEC) NOT DETECTED NOT DETECTED Final   Shiga like toxin producing E coli (STEC) NOT DETECTED NOT DETECTED Final   Shigella/Enteroinvasive E coli (EIEC) NOT DETECTED NOT DETECTED Final   Cryptosporidium NOT DETECTED NOT DETECTED Final   Cyclospora cayetanensis NOT DETECTED NOT DETECTED Final   Entamoeba histolytica NOT DETECTED NOT DETECTED Final   Giardia lamblia NOT DETECTED NOT DETECTED Final   Adenovirus F40/41 NOT DETECTED NOT DETECTED Final   Astrovirus NOT DETECTED NOT DETECTED Final   Norovirus GI/GII NOT DETECTED NOT DETECTED Final   Rotavirus A NOT DETECTED NOT DETECTED Final   Sapovirus (I, II, IV, and V) NOT DETECTED NOT DETECTED Final    Comment: Performed at [facility], [address] [place], [place] [address]    Coagulation Studies: No results for input(s): "LABPROT", "INR" in the last 72 hours.   Urinalysis: No results for input(s): "COLORURINE", "LABSPEC", "PHURINE", "GLUCOSEU", "HGBUR", "BILIRUBINUR", "KETONESUR", "PROTEINUR", "UROBILINOGEN", "NITRITE", "LEUKOCYTESUR" in the last 72 hours.  Invalid input(s): "APPERANCEUR"     Imaging: DG ABD ACUTE 2+V W 1V CHEST  Result Date: 12/25/2022 CLINICAL DATA:  Nausea/vomiting EXAM: DG ABDOMEN ACUTE WITH 1 VIEW CHEST COMPARISON:  Chest radiograph dated 12/12/2022 FINDINGS: Interval placement of a left IJ dual lumen dialysis catheter  with its distal tip in the upper right atrium. Mild left lower lobe atelectasis. Right lung is clear. No frank interstitial edema. No pleural effusion or pneumothorax. Cardiomegaly. Nonobstructive bowel gas pattern. IVC  filter. IMPRESSION: Interval placement of a left IJ dual lumen dialysis catheter with its distal tip in the upper right atrium. Cardiomegaly.  Mild left lower lobe atelectasis. Nonobstructive bowel gas pattern. Electronically Signed   By: Charline Bills M.D.   On: 12/25/2022 21:11     Medications:    sodium chloride Stopped (12/15/22 0035)   sodium chloride Stopped (12/08/22 1823)   anticoagulant sodium citrate     DAPTOmycin (CUBICIN) 500 mg in sodium chloride 0.9 % IVPB 500 mg (12/26/22 1147)    sodium chloride   Intravenous Once   amiodarone  200 mg Per Tube BID   ascorbic acid  500 mg Per Tube BID   atorvastatin  40 mg Per Tube Daily   chlorhexidine  15 mL Mouth Rinse BID   Chlorhexidine Gluconate Cloth  6 each Topical Q0600   vitamin D3  2,000 Units Per Tube Daily   copper  2 mg Per Tube Daily   droxidopa  300 mg Per Tube TID WC   epoetin (EPOGEN/PROCRIT) injection  10,000 Units Intravenous Q T,Th,Sa-HD   escitalopram  10 mg Per Tube Daily   famotidine  10 mg Per Tube Daily   feeding supplement (NEPRO CARB STEADY)  1,120 mL Per Tube Q24H   fludrocortisone  0.2 mg Per Tube Daily   free water  50 mL Per Tube Q4H   heparin injection (subcutaneous)  5,000 Units Subcutaneous Q8H   hydrocortisone cream   Topical TID   hydrOXYzine  100 mg Per Tube BID   insulin aspart  0-6 Units Subcutaneous Q4H   midodrine  20 mg Per Tube TID WC   multivitamin  1 tablet Per Tube QHS   mupirocin ointment   Nasal BID   nutrition supplement (JUVEN)  1 packet Per Tube BID BM   nystatin   Topical TID   mouth rinse  15 mL Mouth Rinse 4 times per day   phenylephrine  10 mg Per J Tube BID   sodium chloride, acetaminophen **OR** acetaminophen, alteplase, alteplase, anticoagulant sodium citrate, artificial tears, clonazePAM, diphenhydrAMINE, fentaNYL (SUBLIMAZE) injection, heparin, ipratropium-albuterol, lidocaine (PF), lidocaine-prilocaine, loperamide HCl, magnesium hydroxide, ondansetron **OR**  ondansetron (ZOFRAN) IV, mouth rinse, pentafluoroprop-tetrafluoroeth, sennosides, traZODone  Assessment/ Plan:  [name] is a 72 y.o.  female with past medical history of diabetes, COPD, GERD, hypertension. OA, a fib, and end stage renal disease on hemodialysis. Patient presents to the ED from her nursing facility complaining of altered mental status. She has been admitted for Lower urinary tract infectious disease [N39.0] Altered mental status, unspecified altered mental status type [R41.82] Sepsis due to gram-negative UTI (HCC) [A41.50, N39.0] Sepsis, due to unspecified organism, unspecified whether acute organ dysfunction present (HCC) [A41.9]  [facility]/TTS/Rt Permcath/? 81.3kg  End stage renal disease -dialysis for today. Orders prepared.   2. Hypotension due to sepsis. Blood cultures and dialysis cath tip cultures with MRSA, Enterococcus (12/06/2022). Repeat cultures (12/07/2022) with no growth.   - Received pressor support during this admission.   - Continue daptomycin as per ID recommendations.  Recommendations include Daptomycin 500mg  with dialysis until 01/03/23. Outpatient dialysis clinic notified.  3. Anemia of chronic kidney disease : PRBC transfusion on 8/26, 9/3  -Maintain the patient on Epogen.     4. Secondary Hyperparathyroidism:  with  hypophosphatemia.   -hold phosphate binders.   5.  Hypokalemia  - continue to monitor with hemodialysis treatments.      LOS: 21 Alexandra Foster 9/14/202411:26 AM

## 2022-12-27 NOTE — TOC Progression Note (Signed)
Transition of Care [facility]) - Progression Note    Patient Details  Name: Alexandra Foster MRN: [id number] Date of Birth: 05-10-1950  [facility] CM/SW Contact  Allena Katz, LCSW Phone Number: 12/27/2022, 12:01 PM  Clinical Narrative:  Message sent to pam with [employer] to follow up on when patients teaching may happen. Message also [facility] to [facility] with [facility] who reports DME will be delivered tomorrow and that they have coordinated this with patients daughter.     Expected Discharge Plan: Home w Home Health Services Barriers to Discharge: Continued Medical Work up  Expected Discharge Plan and Services     Post Acute Care Choice: Home Health Living arrangements for the past 2 months: Skilled Nursing Facility                           HH Arranged: PT, OT, RN [facility] Agency: [facility] Date HH Agency Contacted: 12/24/22 Time HH Agency Contacted: 1310 Representative spoke with at [facility] [place] Agency: Barbara Cower   Social Determinants of Health (SDOH) Interventions SDOH Screenings   Food Insecurity: Patient Unable To Answer (12/06/2022)  Housing: High Risk (12/06/2022)  Transportation Needs: No Transportation Needs (12/06/2022)  Utilities: Not At Risk (12/06/2022)  Alcohol Screen: Low Risk  (02/11/2021)  Depression (PHQ2-9): Low Risk  (04/30/2021)  Financial Resource Strain: Medium Risk (08/09/2020)  Physical Activity: Inactive (08/09/2020)  Social Connections: Moderately Integrated (08/09/2020)  Stress: No Stress Concern Present (08/09/2020)  Tobacco Use: Medium Risk (12/05/2022)    Readmission Risk Interventions    12/26/2022   11:15 AM  Readmission Risk Prevention Plan  Transportation Screening Complete  Medication Review (RN Care Manager) Complete  PCP or Specialist appointment within 3-5 days of discharge Complete  HRI or Home Care Consult Complete  SW Recovery Care/Counseling Consult Complete  Palliative Care Screening Not Applicable  Skilled Nursing  Facility Not Applicable

## 2022-12-27 NOTE — Plan of Care (Signed)
  Problem: Nutrition: Goal: Adequate nutrition will be maintained Outcome: Progressing   Problem: Respiratory: Goal: Ability to maintain adequate ventilation will improve Outcome: Not Progressing   Problem: Education: Goal: Knowledge of General Education information will improve Description: Including pain rating scale, medication(s)/side effects and non-pharmacologic comfort measures Outcome: Not Progressing   Problem: Clinical Measurements: Goal: Ability to maintain clinical measurements within normal limits will improve Outcome: Not Progressing Goal: Diagnostic test results will improve Outcome: Not Progressing Goal: Respiratory complications will improve Outcome: Not Progressing   Problem: Activity: Goal: Risk for activity intolerance will decrease Outcome: Not Progressing

## 2022-12-27 NOTE — Progress Notes (Signed)
  Received patient in bed to unit.   Informed consent signed and in chart.    TX duration: 2 hrs 15 mins Pt became agitated and requested to get off machine early, pt refused to sign AMA Pt did not give an answer as to why she wanted off tx     Transported by  Hand-off given to patient's nurse.    Access used: L CVC Access issues: None   Total UF removed: 301 mL Medication(s) given: Epogen Post HD VS: WD, x hypotension     [facility]

## 2022-12-27 NOTE — Progress Notes (Signed)
Progress Note   Patient: Alexandra Foster [id number]:[id number] DOB: 1950-06-21 DOA: 12/05/2022     21 DOS: the patient was seen and examined on 12/27/2022   Brief hospital course: Alexandra Foster is a 72 y.o. female with past medical history significant for asthma, COPD, type diabetes mellitus, s/p G-tube, GERD, ESRD on HD, hypertension, osteoarthritis, paroxysmal atrial fibrillation and OSA on CPAP, who presented to [facility] ED on 12/05/22 from [facility] due to altered mental status with lethargy and decreased responsiveness. The patient's temperature was 102.2 when EMS arrived to this facility. The patient was not responding to questions throughout transport.  Patient had a fever of 101.4 in the emergency room, leukocytosis of 21.4, lactic acid of 2.3. Blood culture on 8/23 positive for MRSA and staph epidermis. 8/23: Presented from [facility] due to AMS, Code Stroke called due to slurred speech and weakness. Deemed not a candidate for thrombolytics due to no clear last known normal time.  Admitted by [facility] with Nephrology consultation for dialysis. 8/24: Rapid response called due to Hypotension prior to/during Hemodialysis.  Transfer to ICU for peripheral vasopressors and initiation of CRRT.  PCCM consulted to assist with vasopressors.  Blood cultures + for MRSA & Staph Epi. ID consulted. Vascular Surgery consulted, permcath removed.  Temporary HD catheter placed. 8/24: this morning with pronounce left facial droop and dysarthria, remains on CRRT on Levo @8  keep MAP>55 12/08/22: patient with multispecies sepsis, removing central line today due to bacteremia. Holding HD as per nephrology team due to shock.  12/09/22-patient with loose watery BM today, she does have skin breakdown over abd pannus.  She had re-initiation of her psychiatric medications today.  S/p pRBC transfusion overnight. Electrolytes are being repleted.  On room air but requires levophed still.  12/10/22- patient laying in bed with  no acute distress.  Levophed weaned to 78mcg/kg/min, afebrile on room air.  Handling NGT feeds well.  Purewick with no UOP this am. 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  12/11/22- patient off levophed , requires HD today, will place catheter per renal team.  Wound care to see for anal wound.  With episode of emesis during HD and concern for possible aspiration. 12/12/22- Back on low dose Levophed, weaning as able. Worsening Leukocytosis, ID plans to check for C.diff.  Ongoing discussions of when to remove newly placed temporary HD catheter. 12/13/22- Remains on levophed gtt despite scheduled midodrine and droxidopa.  HD session completed 12/14/22- No acute events overnight pt remains on levophed gtt.  Received HD. 12/15/22- No acute events overnight, remains on Levophed.  Increase Droxidopa to 300 mg TID. Leukocytosis improving. 12/16/22- No events overnight.  Remains on Levophed (less than yesterday, down to 5 mcg).  Will give 1 unit blood with HD today for Hgb 7.1.  Increase Midodrine to 20 mg TID.  Consult Cardiology for TEE, however pts family declined to proceed with TEE given possible risk associated with procedure  12/17/22: Pt remains on levophed gtt @2mcg /min.  US Venous Bilateral Lower Extremities revealed no evidence of acute or chronic DVT within the right lower extremity. 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    9/11.  Family clearing out space to prepare for her coming home.  Equipment will need to  be delivered. 9/12.  Patient vomited today.  DuoNeb nebulized solution ordered.  1 dose of Zofran given.  Will get chest x-ray and abdominal x-ray.  [name] would  like to cut down to 4 cans of tube feeding today. 9/13.  Dietitian spoke with patient's daughter and they will do continuous feeding at night 9/14.  No further vomiting.  Tolerated tube feedings at night.  Assessment and Plan: * Septic shock (HCC) Present on admission.  Initially required pressors.  Currently on midodrine and Florinef.  Patient also on droxidopa will taper down to 200 mg 3 times daily.  Spoke with pharmacist about what to do with this medication upon discharge.  Likely will need a prior approval.  Continue daptomycin through 9/21.  MRSA bacteremia MRSA bacteremia, Staph epidermidis bacteremia, Enterococcus faecalis UTI, aspiration pneumonia bilateral lower lobe.  Patient on daptomycin.  Acute metabolic encephalopathy This is improved  Paroxysmal atrial fibrillation (HCC) Continue amiodarone.  Type 2 diabetes mellitus with chronic kidney disease, with long-term current use of insulin (HCC) Patient on sliding scale coverage.  ESRD on hemodialysis (HCC) Continue dialysis as per nephrology  Dysphagia Severe dysphagia secondary to prolonged trach.  Has been on tube feeding since then.  Dietitian spoke with patient's daughter to resume continuous tube feedings at night.  Vomiting Resolved.  Tube feedings at night.  Anemia of chronic disease Last hemoglobin 9.0  Pressure injury of skin Bilateral buttocks stage II, present on admission.  See full description below.  Obesity (BMI 30-39.9) BMI 34.16.  Anxiety and depression Continue Klonopin BuSpar and Lexapro.        Subjective: Patient feels okay and offers no complaints.  No further diarrhea or vomiting.  Initially admitted with altered mental status.  Physical Exam: Vitals:   12/26/22 1623 12/26/22 1652 12/27/22 0107 12/27/22 0845  BP: (!) 107/53  (!) 110/55 (!) 123/48  Pulse: 68  65 66  Resp: 16  18 16   Temp: 98 F (36.7 C)  99 F (37.2 C) 98.4 F (36.9 C)  TempSrc:      SpO2: 100% 100% 99% 95%   Weight:      Height:       Physical Exam HENT:     Head: Normocephalic.     Mouth/Throat:     Pharynx: No oropharyngeal exudate.  Eyes:     General: Lids are normal.     Conjunctiva/sclera: Conjunctivae normal.  Cardiovascular:     Rate and Rhythm: Normal rate and regular rhythm.     Heart sounds: Normal heart sounds, S1 normal and S2 normal.  Pulmonary:     Breath sounds: No decreased breath sounds, wheezing, rhonchi or rales.  Abdominal:     Palpations: Abdomen is soft.     Tenderness: There is no abdominal tenderness.  Musculoskeletal:     Right lower leg: No swelling.     Left Lower Extremity: Left leg is amputated above knee.  Skin:    General: Skin is warm.     Findings: No rash.     Comments: Skin irritation on buttock and back.  Neurological:     Mental Status: She is alert.     Comments: Answers questions appropriately.     Data Reviewed: Creatinine 2.56  Family Communication: Updated daughter on the phone  Disposition: Status is: Inpatient Remains inpatient appropriate because: Will have equipment delivered on Monday.  Spoke with pharmacist about the droxidopa which will likely need a prior authorization.  Will start to taper and monitor  blood pressure.  Planned Discharge Destination: Home with Home Health    Time spent: 28 minutes  Author: Alford Highland, MD 12/27/2022 3:16 PM  For on call review [email or web address].

## 2022-12-27 NOTE — Plan of Care (Signed)

## 2022-12-28 DIAGNOSIS — R197 Diarrhea, unspecified: Secondary | ICD-10-CM | POA: Insufficient documentation

## 2022-12-28 DIAGNOSIS — I480 Paroxysmal atrial fibrillation: Secondary | ICD-10-CM | POA: Diagnosis not present

## 2022-12-28 DIAGNOSIS — A419 Sepsis, unspecified organism: Secondary | ICD-10-CM | POA: Diagnosis not present

## 2022-12-28 DIAGNOSIS — R1319 Other dysphagia: Secondary | ICD-10-CM

## 2022-12-28 DIAGNOSIS — N186 End stage renal disease: Secondary | ICD-10-CM | POA: Diagnosis not present

## 2022-12-28 DIAGNOSIS — G9341 Metabolic encephalopathy: Secondary | ICD-10-CM | POA: Diagnosis not present

## 2022-12-28 LAB — RENAL FUNCTION PANEL
Albumin: 2.4 g/dL — ABNORMAL LOW (ref 3.5–5.0)
Anion gap: 13 (ref 5–15)
BUN: 38 mg/dL — ABNORMAL HIGH (ref 8–23)
CO2: 26 mmol/L (ref 22–32)
Calcium: 8.8 mg/dL — ABNORMAL LOW (ref 8.9–10.3)
Chloride: 96 mmol/L — ABNORMAL LOW (ref 98–111)
Creatinine, Ser: 2 mg/dL — ABNORMAL HIGH (ref 0.44–1.00)
GFR, Estimated: 26 mL/min — ABNORMAL LOW (ref >60)
Glucose, Bld: 131 mg/dL — ABNORMAL HIGH (ref 70–99)
Phosphorus: 3.7 mg/dL (ref 2.5–4.6)
Potassium: 3.5 mmol/L (ref 3.5–5.1)
Sodium: 135 mmol/L (ref 135–145)

## 2022-12-28 LAB — GLUCOSE, CAPILLARY
Glucose-Capillary: 125 mg/dL — ABNORMAL HIGH (ref 70–99)
Glucose-Capillary: 118 mg/dL — ABNORMAL HIGH (ref 70–99)
Glucose-Capillary: 117 mg/dL — ABNORMAL HIGH (ref 70–99)
Glucose-Capillary: 103 mg/dL — ABNORMAL HIGH (ref 70–99)
Glucose-Capillary: 97 mg/dL (ref 70–99)

## 2022-12-28 LAB — HEMOGLOBIN: Hemoglobin: 10.2 g/dL — ABNORMAL LOW (ref 12.0–15.0)

## 2022-12-28 MED ORDER — DIPHENOXYLATE-ATROPINE 2.5-0.025 MG PO TABS
1.0000 | ORAL_TABLET | Freq: Four times a day (QID) | ORAL | Status: DC | PRN
  Administered 2022-12-28 – 2023-01-02 (×4): 1 via ORAL
  Filled 2022-12-28 (×4): qty 1

## 2022-12-28 MED ORDER — CHOLESTYRAMINE 4 G PO PACK
4.0000 g | PACK | Freq: Three times a day (TID) | ORAL | Status: DC
  Administered 2022-12-28 – 2023-01-03 (×16): 4 g
  Filled 2022-12-28 (×20): qty 1

## 2022-12-28 MED ORDER — DROXIDOPA 100 MG PO CAPS
100.0000 mg | ORAL_CAPSULE | Freq: Three times a day (TID) | ORAL | Status: DC
  Administered 2022-12-29 – 2023-01-02 (×11): 100 mg
  Filled 2022-12-28 (×16): qty 1

## 2022-12-28 NOTE — Progress Notes (Signed)
Progress Note   Patient: Alexandra Foster [id number]:[id number] DOB: 05-29-1950 DOA: 12/05/2022     22 DOS: the patient was seen and examined on 12/28/2022   Brief hospital course: Beautiful Kerschner is a 72 y.o. female with past medical history significant for asthma, COPD, type diabetes mellitus, s/p G-tube, GERD, ESRD on HD, hypertension, osteoarthritis, paroxysmal atrial fibrillation and OSA on CPAP, who presented to [facility] ED on 12/05/22 from [facility] due to altered mental status with lethargy and decreased responsiveness. The patient's temperature was 102.2 when EMS arrived to this facility. The patient was not responding to questions throughout transport.  Patient had a fever of 101.4 in the emergency room, leukocytosis of 21.4, lactic acid of 2.3. Blood culture on 8/23 positive for MRSA and staph epidermis. 8/23: Presented from [facility] due to AMS, Code Stroke called due to slurred speech and weakness. Deemed not a candidate for thrombolytics due to no clear last known normal time.  Admitted by [facility] with Nephrology consultation for dialysis. 8/24: Rapid response called due to Hypotension prior to/during Hemodialysis.  Transfer to ICU for peripheral vasopressors and initiation of CRRT.  PCCM consulted to assist with vasopressors.  Blood cultures + for MRSA & Staph Epi. ID consulted. Vascular Surgery consulted, permcath removed.  Temporary HD catheter placed. 8/24: this morning with pronounce left facial droop and dysarthria, remains on CRRT on Levo @8  keep MAP>55 12/08/22: patient with multispecies sepsis, removing central line today due to bacteremia. Holding HD as per nephrology team due to shock.  12/09/22-patient with loose watery BM today, she does have skin breakdown over abd pannus.  She had re-initiation of her psychiatric medications today.  S/p pRBC transfusion overnight. Electrolytes are being repleted.  On room air but requires levophed still.  12/10/22- patient laying in bed with  no acute distress.  Levophed weaned to 10mcg/kg/min, afebrile on room air.  Handling NGT feeds well.  Purewick with no UOP this am. 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  12/11/22- patient off levophed , requires HD today, will place catheter per renal team.  Wound care to see for anal wound.  With episode of emesis during HD and concern for possible aspiration. 12/12/22- Back on low dose Levophed, weaning as able. Worsening Leukocytosis, ID plans to check for C.diff.  Ongoing discussions of when to remove newly placed temporary HD catheter. 12/13/22- Remains on levophed gtt despite scheduled midodrine and droxidopa.  HD session completed 12/14/22- No acute events overnight pt remains on levophed gtt.  Received HD. 12/15/22- No acute events overnight, remains on Levophed.  Increase Droxidopa to 300 mg TID. Leukocytosis improving. 12/16/22- No events overnight.  Remains on Levophed (less than yesterday, down to 5 mcg).  Will give 1 unit blood with HD today for Hgb 7.1.  Increase Midodrine to 20 mg TID.  Consult Cardiology for TEE, however pts family declined to proceed with TEE given possible risk associated with procedure  12/17/22: Pt remains on levophed gtt @2mcg /min.  US Venous Bilateral Lower Extremities revealed no evidence of acute or chronic DVT within the right lower extremity. 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    9/11.  Family clearing out space to prepare for her coming home.  Equipment will need to  be delivered. 9/12.  Patient vomited today.  DuoNeb nebulized solution ordered.  1 dose of Zofran given.  Will get chest x-ray and abdominal x-ray.  [name] would  like to cut down to 4 cans of tube feeding today. 9/13.  Dietitian spoke with patient's daughter and they will do continuous feeding at night 9/14.  No further vomiting.  Tolerated tube feedings at night.  Assessment and Plan: * Septic shock (HCC) Present on admission.  Initially required pressors.  Currently on midodrine and Florinef.  Patient also on droxidopa will taper down to 100 mg 3 times daily.  Spoke with pharmacist about what to do with this medication upon discharge.  Likely will need a prior approval.  Continue daptomycin through 9/21.  MRSA bacteremia MRSA bacteremia, Staph epidermidis bacteremia, Enterococcus faecalis UTI, aspiration pneumonia bilateral lower lobe.  Patient on daptomycin.  Acute metabolic encephalopathy This is improved.  Patient answer simple questions.  Paroxysmal atrial fibrillation (HCC) Continue amiodarone.  Type 2 diabetes mellitus with chronic kidney disease, with long-term current use of insulin (HCC) Patient on sliding scale coverage.  Last hemoglobin A1c 5.2.  ESRD on hemodialysis (HCC) Continue dialysis as per nephrology  Diarrhea Likely secondary to tube feeds.  Will add cholestyramine.  Patient already on Imodium.  Will add Lomotil.  Dysphagia Severe dysphagia secondary to prolonged trach.  Has been on tube feeding since then.  Dietitian spoke with patient's daughter to resume continuous tube feedings at night.  Vomiting Resolved.  Tube feedings at night.  Anemia of chronic disease Last hemoglobin 10.2  Pressure injury of skin Bilateral buttocks stage II, present on admission.  See full description below.  Obesity (BMI 30-39.9) BMI 34.16.  Anxiety and depression Continue Klonopin BuSpar and Lexapro.        Subjective: Patient answers a few questions.  States she feels okay.  Initially admitted with septic shock  Physical Exam: Vitals:   12/27/22 1809 12/28/22 0017 12/28/22 0745 12/28/22 1531  BP: 114/89 (!) 125/55 (!)  117/54 (!) 110/54  Pulse: 61 62 63 63  Resp: 20 16 16 16   Temp: 98 F (36.7 C) 99 F (37.2 C) 97.9 F (36.6 C) 99.2 F (37.3 C)  TempSrc: Axillary     SpO2: 97% 96% 97% 98%  Weight:      Height:       Physical Exam HENT:     Head: Normocephalic.     Mouth/Throat:     Pharynx: No oropharyngeal exudate.  Eyes:     General: Lids are normal.     Conjunctiva/sclera: Conjunctivae normal.  Cardiovascular:     Rate and Rhythm: Normal rate and regular rhythm.     Heart sounds: Normal heart sounds, S1 normal and S2 normal.  Pulmonary:     Breath sounds: No decreased breath sounds, wheezing, rhonchi or rales.  Abdominal:     Palpations: Abdomen is soft.     Tenderness: There is no abdominal tenderness.  Musculoskeletal:     Right lower leg: No swelling.     Left Lower Extremity: Left leg is amputated above knee.  Skin:    General: Skin is warm.     Findings: No rash.     Comments: Skin irritation on buttock and back.  Neurological:     Mental Status: She is alert.     Comments: Answers questions appropriately.     Data Reviewed: Hemoglobin 10.2, creatinine 2  Family Communication: Spoke with patient's daughter on the phone  Disposition: Status  is: Inpatient Remains inpatient appropriate because: Awaiting equipment to deliver  Planned Discharge Destination: Home with Home Health    Time spent: 28 minutes I tried to fill out the form the best I could. Author: Alford Highland, MD 12/28/2022 3:48 PM  For on call review [email or web address].

## 2022-12-28 NOTE — Plan of Care (Signed)

## 2022-12-28 NOTE — Progress Notes (Signed)
[facility]  ROUNDING NOTE   Subjective:   Hemodialysis treatment yesterday. Patient was agitated during treatment and asked to stop treatment 1 hour and 15 minutes early.   UF of .   Objective:  Vital signs in last 24 hours:  Temp:  [97.9 F (36.6 C)-99 F (37.2 C)] 97.9 F (36.6 C) (09/15 0745) Pulse Rate:  [60-70] 63 (09/15 0745) Resp:  [16-24] 16 (09/15 0745) BP: (77-177)/(30-136) 117/54 (09/15 0745) SpO2:  [95 %-99 %] 97 % (09/15 0745)  Weight change:  Filed Weights   12/23/22 0855 12/23/22 1250 12/25/22 0500  Weight: 85 kg 84.4 kg 90.3 kg    Intake/Output: I/O last 3 completed shifts: In: 120 [Other:120] Out: 301 [Other:301]   Intake/Output this shift:  No intake/output data recorded.  Physical Exam: General: Ill appearing  Head: Normocephalic, atraumatic. Moist oral mucosal membranes  Eyes: Anicteric  Lungs:  Diminished bilaterally   Heart: regular   Abdomen:  Soft, nontender  Extremities: 1+ peripheral edema.  Neurologic: Somnolent  Access:  LT permcath placed on 12/19/22    Basic Metabolic Panel: Recent Labs  Lab 12/24/22 0553 12/25/22 0438 12/26/22 0425 12/27/22 0448 12/28/22 0555  NA 137 136 132* 133* 135  K 3.7 4.0 3.9 3.4* 3.5  CL 101 97* 95* 96* 96*  CO2 29 28 28 28 26   GLUCOSE 130* 88 86 135* 131*  BUN 35* 51* 25* 40* 38*  CREATININE 1.86* 2.31* 1.76* 2.56* 2.00*  CALCIUM 8.7* 9.2 8.8* 8.9 8.8*  PHOS 2.7 4.1 3.3 4.4 3.7    Liver Function Tests: Recent Labs  Lab 12/24/22 0553 12/25/22 0438 12/26/22 0425 12/27/22 0448 12/28/22 0555  ALBUMIN 2.4* 2.6* 2.6* 2.5* 2.4*   No results for input(s): "LIPASE", "AMYLASE" in the last 168 hours. No results for input(s): "AMMONIA" in the last 168 hours.  CBC: Recent Labs  Lab 12/23/22 0927 12/27/22 1915 12/28/22 0555  WBC 16.9* 13.6*  --   HGB 9.0* 10.2* 10.2*  HCT 30.2* 33.0*  --   MCV 91.2 88.9  --   PLT 456* 485*  --     Cardiac Enzymes: Recent Labs  Lab  12/26/22 0425  CKTOTAL 15*    BNP: Invalid input(s): "POCBNP"  CBG: Recent Labs  Lab 12/27/22 0845 12/27/22 1202 12/27/22 2021 12/28/22 0238 12/28/22 0746  GLUCAP 147* 126* 136* 125* 118*    Microbiology: Results for orders placed or performed during the hospital encounter of 12/05/22  Culture, blood (Routine x 2)     Status: Abnormal   Collection Time: 12/05/22  8:56 PM   Specimen: BLOOD  Result Value Ref Range Status   Specimen Description   Final    BLOOD BLOOD RIGHT ARM Performed at [facility], [address], [place], [place] [address]    Special Requests   Final    BOTTLES DRAWN AEROBIC AND ANAEROBIC Blood Culture adequate volume Performed at [facility], [address] [place], [place] [address]    Culture  Setup Time   Final    GRAM POSITIVE COCCI IN BOTH AEROBIC AND ANAEROBIC BOTTLES CRITICAL RESULT CALLED TO, READ BACK BY AND VERIFIED WITH: SHEEMA HALLLAJI AT 1206 12/06/22.PMF Performed at [facility], [address] [place], [place] [address]    Culture (A)  Final    STAPHYLOCOCCUS AUREUS SUSCEPTIBILITIES PERFORMED ON PREVIOUS CULTURE WITHIN THE LAST 5 DAYS. Performed at [facility], [address] [place], [place] [address]    Report Status 12/08/2022  FINAL  Final  Culture, blood (Routine x 2)     Status: Abnormal   Collection Time: 12/05/22  8:57 PM   Specimen: BLOOD  Result Value Ref Range Status   Specimen Description   Final    BLOOD BLOOD LEFT ARM Performed at [facility], [address] [place], [place] [address]    Special Requests   Final    BOTTLES DRAWN AEROBIC AND ANAEROBIC Blood Culture adequate volume Performed at [facility], [address] [place], [place] [address]    Culture  Setup Time   Final    GRAM POSITIVE COCCI IN BOTH AEROBIC AND ANAEROBIC BOTTLES CRITICAL RESULT CALLED TO, READ BACK BY AND VERIFIED WITH: [facility] HALLAJI AT 1206 12/06/22.PMF Performed at  [facility], [address] [place], [place] [address]    Culture METHICILLIN RESISTANT STAPHYLOCOCCUS AUREUS (A)  Final   Report Status 12/08/2022 FINAL  Final   Organism ID, Bacteria METHICILLIN RESISTANT STAPHYLOCOCCUS AUREUS  Final      Susceptibility   Methicillin resistant staphylococcus aureus - MIC*    CIPROFLOXACIN >=8 RESISTANT Resistant     ERYTHROMYCIN >=8 RESISTANT Resistant     GENTAMICIN <=0.5 SENSITIVE Sensitive     OXACILLIN >=4 RESISTANT Resistant     TETRACYCLINE <=1 SENSITIVE Sensitive     VANCOMYCIN <=0.5 SENSITIVE Sensitive     TRIMETH/SULFA >=320 RESISTANT Resistant     CLINDAMYCIN <=0.25 SENSITIVE Sensitive     RIFAMPIN <=0.5 SENSITIVE Sensitive     Inducible Clindamycin NEGATIVE Sensitive     LINEZOLID 2 SENSITIVE Sensitive     * METHICILLIN RESISTANT STAPHYLOCOCCUS AUREUS  Blood Culture ID Panel (Reflexed)     Status: Abnormal   Collection Time: 12/05/22  8:57 PM  Result Value Ref Range Status   Enterococcus faecalis NOT DETECTED NOT DETECTED Final   Enterococcus Faecium NOT DETECTED NOT DETECTED Final   Listeria monocytogenes NOT DETECTED NOT DETECTED Final   Staphylococcus species DETECTED (A) NOT DETECTED Final    Comment: CRITICAL RESULT CALLED TO, READ BACK BY AND VERIFIED WITH: SHEEMA HALLAJI AT 1206 12/06/22.PMF    Staphylococcus aureus (BCID) DETECTED (A) NOT DETECTED Final    Comment: Methicillin (oxacillin)-resistant Staphylococcus aureus (MRSA). MRSA is predictably resistant to beta-lactam antibiotics (except ceftaroline). Preferred therapy is vancomycin unless clinically contraindicated. Patient requires contact precautions if  hospitalized. CRITICAL RESULT CALLED TO, READ BACK BY AND VERIFIED WITH: SHEEMA HALLAJI AT 1206 12/06/22.PMF    Staphylococcus epidermidis DETECTED (A) NOT DETECTED Final    Comment: CRITICAL RESULT CALLED TO, READ BACK BY AND VERIFIED WITH: SHEEMA HALLAJI AT 1206 12/06/22.PMF    Staphylococcus lugdunensis NOT  DETECTED NOT DETECTED Final   Streptococcus species NOT DETECTED NOT DETECTED Final   Streptococcus agalactiae NOT DETECTED NOT DETECTED Final   Streptococcus pneumoniae NOT DETECTED NOT DETECTED Final   Streptococcus pyogenes NOT DETECTED NOT DETECTED Final   A.calcoaceticus-baumannii NOT DETECTED NOT DETECTED Final   Bacteroides fragilis NOT DETECTED NOT DETECTED Final   Enterobacterales NOT DETECTED NOT DETECTED Final   Enterobacter cloacae complex NOT DETECTED NOT DETECTED Final   Escherichia coli NOT DETECTED NOT DETECTED Final   Klebsiella aerogenes NOT DETECTED NOT DETECTED Final   Klebsiella oxytoca NOT DETECTED NOT DETECTED Final   Klebsiella pneumoniae NOT DETECTED NOT DETECTED Final   Proteus species NOT DETECTED NOT DETECTED Final   Salmonella species NOT DETECTED NOT DETECTED Final   Serratia marcescens NOT DETECTED NOT DETECTED Final   Haemophilus influenzae NOT  DETECTED NOT DETECTED Final   Neisseria meningitidis NOT DETECTED NOT DETECTED Final   Pseudomonas aeruginosa NOT DETECTED NOT DETECTED Final   Stenotrophomonas maltophilia NOT DETECTED NOT DETECTED Final   Candida albicans NOT DETECTED NOT DETECTED Final   Candida auris NOT DETECTED NOT DETECTED Final   Candida glabrata NOT DETECTED NOT DETECTED Final   Candida krusei NOT DETECTED NOT DETECTED Final   Candida parapsilosis NOT DETECTED NOT DETECTED Final   Candida tropicalis NOT DETECTED NOT DETECTED Final   Cryptococcus neoformans/gattii NOT DETECTED NOT DETECTED Final   Methicillin resistance mecA/C DETECTED (A) NOT DETECTED Final    Comment: CRITICAL RESULT CALLED TO, READ BACK BY AND VERIFIED WITH: SHEEMA HALLAJI AT 1206 12/06/22.PMF    Meth resistant mecA/C and MREJ DETECTED (A) NOT DETECTED Final    Comment: CRITICAL RESULT CALLED TO, READ BACK BY AND VERIFIED WITH: SHEEMA HALLAJI AT 1206 12/06/22.PMF Performed at [facility], [address] [place], [place] [address]   Resp panel by RT-PCR  (RSV, Flu A&B, Covid) Anterior Nasal Swab     Status: None   Collection Time: 12/05/22  9:53 PM   Specimen: Anterior Nasal Swab  Result Value Ref Range Status   SARS Coronavirus 2 by RT PCR NEGATIVE NEGATIVE Final    Comment: (NOTE) SARS-CoV-2 target nucleic acids are NOT DETECTED.  The SARS-CoV-2 RNA is generally detectable in upper respiratory specimens during the acute phase of infection. The lowest concentration of SARS-CoV-2 viral copies this assay can detect is 138 copies/mL. A negative result does not preclude SARS-Cov-2 infection and should not be used as the sole basis for treatment or other patient management decisions. A negative result may occur with  improper specimen collection/handling, submission of specimen other than nasopharyngeal swab, presence of viral mutation(s) within the areas targeted by this assay, and inadequate number of viral copies(<138 copies/mL). A negative result must be combined with clinical observations, patient history, and epidemiological information. The expected result is Negative.  Fact Sheet for Patients:  BloggerCourse.com  Fact Sheet for Healthcare Providers:  SeriousBroker.it  This test is no t yet approved or cleared by the [place] FDA and  has been authorized for detection and/or diagnosis of SARS-CoV-2 by FDA under an Emergency Use Authorization (EUA). This EUA will remain  in effect (meaning this test can be used) for the duration of the COVID-19 declaration under Section 564(b)(1) of the Act, 21 U.S.C.section 360bbb-3(b)(1), unless the authorization is terminated  or revoked sooner.       Influenza A by PCR NEGATIVE NEGATIVE Final   Influenza B by PCR NEGATIVE NEGATIVE Final    Comment: (NOTE) The Xpert Xpress SARS-CoV-2/FLU/RSV plus assay is intended as an aid in the diagnosis of influenza from Nasopharyngeal swab specimens and should not be used as a sole basis for  treatment. Nasal washings and aspirates are unacceptable for Xpert Xpress SARS-CoV-2/FLU/RSV testing.  Fact Sheet for Patients: BloggerCourse.com  Fact Sheet for Healthcare Providers: SeriousBroker.it  This test is not yet approved or cleared by the [place] FDA and has been authorized for detection and/or diagnosis of SARS-CoV-2 by FDA under an Emergency Use Authorization (EUA). This EUA will remain in effect (meaning this test can be used) for the duration of the COVID-19 declaration under Section 564(b)(1) of the Act, 21 U.S.C. section 360bbb-3(b)(1), unless the authorization is terminated or revoked.     Resp Syncytial Virus by PCR NEGATIVE NEGATIVE Final    Comment: (NOTE) Fact Sheet for Patients: BloggerCourse.com  Fact Sheet for Healthcare Providers: SeriousBroker.it  This test is not yet approved or cleared by the [place] FDA and has been authorized for detection and/or diagnosis of SARS-CoV-2 by FDA under an Emergency Use Authorization (EUA). This EUA will remain in effect (meaning this test can be used) for the duration of the COVID-19 declaration under Section 564(b)(1) of the Act, 21 U.S.C. section 360bbb-3(b)(1), unless the authorization is terminated or revoked.  Performed at [facility], [address] [place], [place] [address]   MRSA Next Gen by PCR, Nasal     Status: Abnormal   Collection Time: 12/06/22  4:27 AM   Specimen: Nasal Mucosa; Nasal Swab  Result Value Ref Range Status   MRSA by PCR Next Gen DETECTED (A) NOT DETECTED Final    Comment: RESULT CALLED TO, READ BACK BY AND VERIFIED WITH: KRISTINE CHAMBERS AT 8295 12/06/22.PMF (NOTE) The GeneXpert MRSA Assay (FDA approved for NASAL specimens only), is one component of a comprehensive MRSA colonization surveillance program. It is not intended to diagnose MRSA infection nor to  guide or monitor treatment for MRSA infections. Test performance is not FDA approved in patients less than 95 years old. Performed at [facility], [address] [place], [place] [address]   Cath Tip Culture     Status: Abnormal   Collection Time: 12/06/22  2:22 PM   Specimen: Catheter Tip; Other  Result Value Ref Range Status   Specimen Description   Final    CATH TIP Performed at [facility], [address] [place], [place] [address]    Special Requests   Final    NONE Performed at [facility], [address] [place], [place] [address]    Culture (A)  Final    >=100,000 COLONIES/mL METHICILLIN RESISTANT STAPHYLOCOCCUS AUREUS   Report Status 12/08/2022 FINAL  Final   Organism ID, Bacteria METHICILLIN RESISTANT STAPHYLOCOCCUS AUREUS (A)  Final      Susceptibility   Methicillin resistant staphylococcus aureus - MIC*    CIPROFLOXACIN >=8 RESISTANT Resistant     ERYTHROMYCIN >=8 RESISTANT Resistant     GENTAMICIN <=0.5 SENSITIVE Sensitive     OXACILLIN >=4 RESISTANT Resistant     TETRACYCLINE <=1 SENSITIVE Sensitive     VANCOMYCIN <=0.5 SENSITIVE Sensitive     TRIMETH/SULFA >=320 RESISTANT Resistant     CLINDAMYCIN <=0.25 SENSITIVE Sensitive     RIFAMPIN <=0.5 SENSITIVE Sensitive     Inducible Clindamycin NEGATIVE Sensitive     LINEZOLID 2 SENSITIVE Sensitive     * >=100,000 COLONIES/mL METHICILLIN RESISTANT STAPHYLOCOCCUS AUREUS  Urine Culture (for pregnant, neutropenic or urologic patients or patients with an indwelling urinary catheter)     Status: Abnormal   Collection Time: 12/06/22  5:41 PM   Specimen: Urine, Clean Catch  Result Value Ref Range Status   Specimen Description   Final    URINE, CLEAN CATCH Performed at [facility], [address] [place], [place] [address]    Special Requests   Final    Normal Performed at [facility], [address] [place], [place] [address]    Culture (A)  Final    70,000  COLONIES/mL ENTEROCOCCUS FAECALIS 70,000 COLONIES/mL YEAST    Report Status 12/09/2022 FINAL  Final   Organism ID, Bacteria ENTEROCOCCUS FAECALIS (A)  Final      Susceptibility   Enterococcus faecalis - MIC*    AMPICILLIN <=2 SENSITIVE Sensitive     NITROFURANTOIN <=16 SENSITIVE Sensitive  VANCOMYCIN 1 SENSITIVE Sensitive     * 70,000 COLONIES/mL ENTEROCOCCUS FAECALIS  Culture, blood (Routine X 2) w Reflex to ID Panel     Status: None   Collection Time: 12/07/22  1:31 PM   Specimen: BLOOD  Result Value Ref Range Status   Specimen Description BLOOD BLOOD RIGHT HAND  Final   Special Requests   Final    BOTTLES DRAWN AEROBIC ONLY Blood Culture adequate volume   Culture   Final    NO GROWTH 5 DAYS Performed at [facility], [address] [place], [place] [address]    Report Status 12/12/2022 FINAL  Final  Culture, blood (Routine X 2) w Reflex to ID Panel     Status: None   Collection Time: 12/07/22  6:41 PM   Specimen: BLOOD  Result Value Ref Range Status   Specimen Description BLOOD BLOOD RIGHT HAND  Final   Special Requests   Final    BOTTLES DRAWN AEROBIC AND ANAEROBIC Blood Culture results may not be optimal due to an inadequate volume of blood received in culture bottles   Culture   Final    NO GROWTH 5 DAYS Performed at [facility], [address], [place], [place] [address]    Report Status 12/12/2022 FINAL  Final  C Difficile Quick Screen (NO PCR Reflex)     Status: None   Collection Time: 12/12/22  4:07 PM   Specimen: STOOL  Result Value Ref Range Status   C Diff antigen NEGATIVE NEGATIVE Final   C Diff toxin NEGATIVE NEGATIVE Final   C Diff interpretation No C. difficile detected.  Final    Comment: Performed at [facility], [address] [place], [place] [address]  Gastrointestinal Panel by PCR , Stool     Status: None   Collection Time: 12/13/22  8:38 AM   Specimen: Stool  Result Value Ref Range Status   Campylobacter  species NOT DETECTED NOT DETECTED Final   Plesimonas shigelloides NOT DETECTED NOT DETECTED Final   Salmonella species NOT DETECTED NOT DETECTED Final   Yersinia enterocolitica NOT DETECTED NOT DETECTED Final   Vibrio species NOT DETECTED NOT DETECTED Final   Vibrio cholerae NOT DETECTED NOT DETECTED Final   Enteroaggregative E coli (EAEC) NOT DETECTED NOT DETECTED Final   Enteropathogenic E coli (EPEC) NOT DETECTED NOT DETECTED Final   Enterotoxigenic E coli (ETEC) NOT DETECTED NOT DETECTED Final   Shiga like toxin producing E coli (STEC) NOT DETECTED NOT DETECTED Final   Shigella/Enteroinvasive E coli (EIEC) NOT DETECTED NOT DETECTED Final   Cryptosporidium NOT DETECTED NOT DETECTED Final   Cyclospora cayetanensis NOT DETECTED NOT DETECTED Final   Entamoeba histolytica NOT DETECTED NOT DETECTED Final   Giardia lamblia NOT DETECTED NOT DETECTED Final   Adenovirus F40/41 NOT DETECTED NOT DETECTED Final   Astrovirus NOT DETECTED NOT DETECTED Final   Norovirus GI/GII NOT DETECTED NOT DETECTED Final   Rotavirus A NOT DETECTED NOT DETECTED Final   Sapovirus (I, II, IV, and V) NOT DETECTED NOT DETECTED Final    Comment: Performed at [facility], [address] [place], [place] [address]    Coagulation Studies: No results for input(s): "LABPROT", "INR" in the last 72 hours.   Urinalysis: No results for input(s): "COLORURINE", "LABSPEC", "PHURINE", "GLUCOSEU", "HGBUR", "BILIRUBINUR", "KETONESUR", "PROTEINUR", "UROBILINOGEN", "NITRITE", "LEUKOCYTESUR" in the last 72 hours.  Invalid input(s): "APPERANCEUR"     Imaging: No results found.   Medications:    sodium chloride Stopped (  12/15/22 0035)   sodium chloride Stopped (12/08/22 1823)   anticoagulant sodium citrate     DAPTOmycin (CUBICIN) 500 mg in sodium chloride 0.9 % IVPB 500 mg (12/27/22 1226)    sodium chloride   Intravenous Once   amiodarone  200 mg Per Tube BID   ascorbic acid  500 mg Per Tube BID    atorvastatin  40 mg Per Tube Daily   chlorhexidine  15 mL Mouth Rinse BID   Chlorhexidine Gluconate Cloth  6 each Topical Q0600   vitamin D3  2,000 Units Per Tube Daily   copper  2 mg Per Tube Daily   droxidopa  200 mg Per Tube TID WC   epoetin (EPOGEN/PROCRIT) injection  10,000 Units Intravenous Q T,Th,Sa-HD   escitalopram  10 mg Per Tube Daily   famotidine  10 mg Per Tube Daily   feeding supplement (NEPRO CARB STEADY)  1,120 mL Per Tube Q24H   fludrocortisone  0.2 mg Per Tube Daily   free water  50 mL Per Tube Q4H   heparin injection (subcutaneous)  5,000 Units Subcutaneous Q8H   hydrocortisone cream   Topical TID   hydrOXYzine  100 mg Per Tube BID   insulin aspart  0-6 Units Subcutaneous Q4H   midodrine  20 mg Per Tube TID WC   multivitamin  1 tablet Per Tube QHS   mupirocin ointment   Nasal BID   nutrition supplement (JUVEN)  1 packet Per Tube BID BM   nystatin   Topical TID   mouth rinse  15 mL Mouth Rinse 4 times per day   phenylephrine  10 mg Per J Tube BID   sodium chloride, acetaminophen **OR** acetaminophen, alteplase, alteplase, anticoagulant sodium citrate, artificial tears, clonazePAM, diphenhydrAMINE, fentaNYL (SUBLIMAZE) injection, heparin, ipratropium-albuterol, lidocaine (PF), lidocaine-prilocaine, loperamide HCl, magnesium hydroxide, ondansetron **OR** ondansetron (ZOFRAN) IV, mouth rinse, pentafluoroprop-tetrafluoroeth, sennosides, traZODone  Assessment/ Plan:  Ms. Hailly Domina is a 72 y.o.  female with past medical history of diabetes, COPD, GERD, hypertension. OA, a fib, and end stage renal disease on hemodialysis. Patient presents to the ED from her nursing facility complaining of altered mental status. She has been admitted for Lower urinary tract infectious disease [N39.0] Altered mental status, unspecified altered mental status type [R41.82] Sepsis due to gram-negative UTI (HCC) [A41.50, N39.0] Sepsis, due to unspecified organism, unspecified whether  acute organ dysfunction present (HCC) [A41.9]  [facility]/TTS/Rt Permcath/? 81.3kg  End stage renal disease - Continue TTS schedule  2. Hypotension due to sepsis. Blood cultures and dialysis cath tip cultures with MRSA, Enterococcus (12/06/2022). Repeat cultures (12/07/2022) with no growth.   - Received pressor support during this admission.   - Continue daptomycin as per ID recommendations.  Recommendations include Daptomycin 500mg  with dialysis until 01/03/23. Outpatient dialysis clinic notified.  - Current regimen of midodrine, phenylephrine and droxidopa  3. Anemia of chronic kidney disease : PRBC transfusion on 8/26, 9/3  -EPO with HD treatments   4. Secondary Hyperparathyroidism:  with hypophosphatemia.   -hold phosphate binders.   5.  Hypokalemia  - continue to monitor with hemodialysis treatments.   - 3K bath with next dialysis treatment.      LOS: 22 Alexandra Foster 9/15/202410:45 AM

## 2022-12-28 NOTE — Assessment & Plan Note (Addendum)
Likely secondary to tube feeds.  Cholestyramine 3 times a day.  Patient already on Imodium as needed and as needed Lomotil.  2 bowel movements documented yesterday.

## 2022-12-29 ENCOUNTER — Telehealth (HOSPITAL_COMMUNITY): Payer: Medicare Other | Admitting: Pharmacy Technician

## 2022-12-29 ENCOUNTER — Other Ambulatory Visit (HOSPITAL_COMMUNITY): Payer: Self-pay

## 2022-12-29 DIAGNOSIS — N186 End stage renal disease: Secondary | ICD-10-CM | POA: Diagnosis not present

## 2022-12-29 DIAGNOSIS — R7881 Bacteremia: Secondary | ICD-10-CM | POA: Diagnosis not present

## 2022-12-29 DIAGNOSIS — G9341 Metabolic encephalopathy: Secondary | ICD-10-CM | POA: Diagnosis not present

## 2022-12-29 DIAGNOSIS — I480 Paroxysmal atrial fibrillation: Secondary | ICD-10-CM | POA: Diagnosis not present

## 2022-12-29 LAB — RENAL FUNCTION PANEL
Albumin: 2.4 g/dL — ABNORMAL LOW (ref 3.5–5.0)
Anion gap: 9 (ref 5–15)
BUN: 54 mg/dL — ABNORMAL HIGH (ref 8–23)
CO2: 26 mmol/L (ref 22–32)
Calcium: 9.4 mg/dL (ref 8.9–10.3)
Chloride: 99 mmol/L (ref 98–111)
Creatinine, Ser: 2.5 mg/dL — ABNORMAL HIGH (ref 0.44–1.00)
GFR, Estimated: 20 mL/min — ABNORMAL LOW (ref >60)
Glucose, Bld: 120 mg/dL — ABNORMAL HIGH (ref 70–99)
Phosphorus: 4.6 mg/dL (ref 2.5–4.6)
Potassium: 3.8 mmol/L (ref 3.5–5.1)
Sodium: 134 mmol/L — ABNORMAL LOW (ref 135–145)

## 2022-12-29 LAB — GLUCOSE, CAPILLARY
Glucose-Capillary: 112 mg/dL — ABNORMAL HIGH (ref 70–99)
Glucose-Capillary: 127 mg/dL — ABNORMAL HIGH (ref 70–99)
Glucose-Capillary: 127 mg/dL — ABNORMAL HIGH (ref 70–99)
Glucose-Capillary: 132 mg/dL — ABNORMAL HIGH (ref 70–99)
Glucose-Capillary: 83 mg/dL (ref 70–99)
Glucose-Capillary: 88 mg/dL (ref 70–99)

## 2022-12-29 MED ORDER — HYDROXYZINE HCL 50 MG PO TABS: 100.0000 mg | ORAL_TABLET | Freq: Two times a day (BID) | ORAL | 0 refills | Status: DC

## 2022-12-29 MED ORDER — MIDODRINE HCL 10 MG PO TABS: 20.0000 mg | ORAL_TABLET | Freq: Three times a day (TID) | ORAL | 0 refills | Status: DC

## 2022-12-29 MED ORDER — AMIODARONE HCL 200 MG PO TABS: 200.0000 mg | ORAL_TABLET | Freq: Two times a day (BID) | ORAL | 0 refills | Status: DC

## 2022-12-29 MED ORDER — SODIUM CHLORIDE 0.9 % IV SOLN: 500.0000 mg | INTRAVENOUS | Status: DC

## 2022-12-29 MED ORDER — CHOLESTYRAMINE 4 G PO PACK: 4.0000 g | PACK | Freq: Three times a day (TID) | ORAL | 0 refills | Status: DC

## 2022-12-29 MED ORDER — DROXIDOPA 100 MG PO CAPS: 100.0000 mg | ORAL_CAPSULE | Freq: Three times a day (TID) | ORAL | 0 refills | Status: DC

## 2022-12-29 MED ORDER — DIPHENOXYLATE-ATROPINE 2.5-0.025 MG PO TABS: 1.0000 | ORAL_TABLET | Freq: Four times a day (QID) | ORAL | 0 refills | Status: DC | PRN

## 2022-12-29 MED ORDER — CLONAZEPAM 0.5 MG PO TABS: 0.5000 mg | ORAL_TABLET | Freq: Two times a day (BID) | ORAL | 0 refills | Status: DC | PRN

## 2022-12-29 MED ORDER — ESCITALOPRAM OXALATE 10 MG PO TABS: 10.0000 mg | ORAL_TABLET | Freq: Every day | ORAL | 0 refills | Status: DC

## 2022-12-29 MED ORDER — ACETAMINOPHEN 325 MG PO TABS: 650.0000 mg | ORAL_TABLET | Freq: Four times a day (QID) | ORAL | Status: DC | PRN

## 2022-12-29 MED ORDER — ATORVASTATIN CALCIUM 40 MG PO TABS: 40.0000 mg | ORAL_TABLET | Freq: Every day | ORAL | 0 refills | Status: DC

## 2022-12-29 MED ORDER — CHLORHEXIDINE GLUCONATE 0.12% ORAL RINSE (MEDLINE KIT): 15.0000 mL | Freq: Two times a day (BID) | OROMUCOSAL | 0 refills | Status: AC

## 2022-12-29 MED ORDER — POLYVINYL ALCOHOL 1.4 % OP SOLN: 1.0000 [drp] | OPHTHALMIC | 0 refills | Status: DC | PRN

## 2022-12-29 MED ORDER — NEPRO/CARBSTEADY PO LIQD: 1120.0000 mL | ORAL | Status: DC

## 2022-12-29 MED ORDER — VITAMIN D3 25 MCG PO TABS: 2000.0000 [IU] | ORAL_TABLET | Freq: Every day | ORAL | 0 refills | Status: DC

## 2022-12-29 MED ORDER — FREE WATER: 50.0000 mL | Status: DC

## 2022-12-29 MED ORDER — FLUDROCORTISONE ACETATE 0.1 MG PO TABS: 0.2000 mg | ORAL_TABLET | Freq: Every day | ORAL | 0 refills | Status: DC

## 2022-12-29 MED ORDER — RENA-VITE PO TABS: 1.0000 | ORAL_TABLET | Freq: Every day | ORAL | 0 refills | Status: DC

## 2022-12-29 MED ORDER — FAMOTIDINE 10 MG PO TABS: 10.0000 mg | ORAL_TABLET | Freq: Every day | ORAL | 0 refills | Status: DC

## 2022-12-29 MED ORDER — LOPERAMIDE HCL 1 MG/7.5ML PO SUSP: 4.0000 mg | Freq: Three times a day (TID) | ORAL | 0 refills | Status: AC | PRN

## 2022-12-29 MED ORDER — NYSTATIN 100000 UNIT/GM EX POWD: Freq: Three times a day (TID) | CUTANEOUS | 2 refills | Status: DC

## 2022-12-29 NOTE — TOC Progression Note (Signed)
Transition of Care [facility]) - Progression Note    Patient Details  Name: Alexandra Foster MRN: [id number] Date of Birth: September 24, 1950  Transition of Care [facility] CM/SW Contact  Marlowe Sax, RN Phone Number: 12/29/2022, 10:46 AM  Clinical Narrative:     Daughter confirms that [employer] delivered the DME yesterday, she got a 20 inch wheelchair, I called Mitch with [employer] and he confirmed that unless she is over 300 lbs she can not get a bariatric WC  Expected Discharge Plan: Home w Home Health Services Barriers to Discharge: Continued Medical Work up  Expected Discharge Plan and Services     Post Acute Care Choice: Home Health Living arrangements for the past 2 months: Skilled Nursing Facility                           HH Arranged: PT, OT, RN [facility] Agency: [facility] Date HH Agency Contacted: 12/24/22 Time HH Agency Contacted: 1310 Representative spoke with at [facility] Agency: Barbara Cower   Social Determinants of Health (SDOH) Interventions SDOH Screenings   Food Insecurity: Patient Unable To Answer (12/06/2022)  Housing: High Risk (12/06/2022)  Transportation Needs: No Transportation Needs (12/06/2022)  Utilities: Not At Risk (12/06/2022)  Alcohol Screen: Low Risk  (02/11/2021)  Depression (PHQ2-9): Low Risk  (04/30/2021)  Financial Resource Strain: Medium Risk (08/09/2020)  Physical Activity: Inactive (08/09/2020)  Social Connections: Moderately Integrated (08/09/2020)  Stress: No Stress Concern Present (08/09/2020)  Tobacco Use: Medium Risk (12/05/2022)    Readmission Risk Interventions    12/26/2022   11:15 AM  Readmission Risk Prevention Plan  Transportation Screening Complete  Medication Review (RN Care Manager) Complete  PCP or Specialist appointment within 3-5 days of discharge Complete  HRI or Home Care Consult Complete  SW Recovery Care/Counseling Consult Complete  Palliative Care Screening Not Applicable  Skilled Nursing Facility Not Applicable

## 2022-12-29 NOTE — Care Management Important Message (Signed)
Important Message  Patient Details  Name: Alexandra Foster MRN: [id number] Date of Birth: November 26, 1950   Medicare Important Message Given:  Yes     Olegario Messier A Deeksha Cotrell 12/29/2022, 10:53 AM

## 2022-12-29 NOTE — TOC Benefit Eligibility Note (Signed)
Patient Product/process development scientist completed.    The patient is insured through [employer]. Patient has Medicare and is not eligible for a copay card, but may be able to apply for patient assistance, if available.    Ran test claim for droxidopa 100 mg capsules and Requires Prior Authorization.   This test claim was processed through [facility]- copay amounts may vary at other pharmacies due to pharmacy/plan contracts, or as the patient moves through the different stages of their insurance plan.     Roland Earl, CPHT Pharmacy Technician III Certified Patient Advocate [facility] Patient Advocate Team Direct Number: [phone]  Fax: [phone]

## 2022-12-29 NOTE — Plan of Care (Signed)
  Problem: Fluid Volume: Goal: Hemodynamic stability will improve Outcome: Progressing   Problem: Clinical Measurements: Goal: Signs and symptoms of infection will decrease Outcome: Progressing   Problem: Respiratory: Goal: Ability to maintain adequate ventilation will improve Outcome: Progressing   Problem: Clinical Measurements: Goal: Ability to maintain clinical measurements within normal limits will improve Outcome: Progressing Goal: Will remain free from infection Outcome: Progressing Goal: Respiratory complications will improve Outcome: Progressing Goal: Cardiovascular complication will be avoided Outcome: Progressing   Problem: Elimination: Goal: Will not experience complications related to bowel motility Outcome: Progressing Goal: Will not experience complications related to urinary retention Outcome: Progressing   Problem: Pain Managment: Goal: General experience of comfort will improve Outcome: Progressing   Problem: Safety: Goal: Ability to remain free from injury will improve Outcome: Progressing   Problem: Skin Integrity: Goal: Risk for impaired skin integrity will decrease Outcome: Progressing

## 2022-12-29 NOTE — Telephone Encounter (Signed)
Pharmacy Patient Advocate Encounter   Received notification that prior authorization for Droxidopa 100MG  capsules is required/requested.   Insurance verification completed.   The patient is insured through [facility] .   Per test claim: PA required; PA submitted to [facility] via CoverMyMeds Key/confirmation #/EOC [id number] Status is pending

## 2022-12-29 NOTE — Progress Notes (Addendum)
Brief Nutrition Follow-Up Note  Case discussed with Jeri Modena with [employer]. Plan for pt to discharge home with family once all home health needs have been obtained.    Pt changed to bolus feeds per family request on 9/10. Pt noted to have an episode of nausea/vomiting yesterday; KUB within normal limits. Pt with long suspected gastroparesis. Pt has vomited twice this admission. Pt with chronic intermittent nausea/vomiting. Pt had intermittent nausea/vomiting during a lengthy admission in 2023 where she required erythromycin. Per SNF, pt with intermittent nausea and vomiting at the facility pta. Pt previously unable to tolerate bolus feeds and was changed to nocturnal feeds at SNF. Nepro is high in fiber and will already digest more slowly. Pt placed on Nepro for volume restriction secondary to ongoing hyponatremia and chronic HD. Pt is at a high readmission risk and change of formula could potentially put pt at high risk for readmission. Pt has been tolerating Nepro well prior to this hospital admission (pt was discharged to [facility] on 06/13/21). Given pt's history of suspected gastroparesis and intermittent nausea and vomiting, pt would not benefit from change of formula at this time. Additionally, pt with ESRD on HD and K and Phos are stable on currently formula; a standard nutrition formula has higher K and Phos content.  Anticipate long term need for enteral nutrition.   Current discharge TF recommendations are as follows:  Change to nocturnal tube feeds:   Nepro @70ml /hr x 16 hrs (from 1800-1000)   Free water flushes 50ml q4 hours to maintain tube patency    Regimen provides 2016kcal/day, 91g/day protein and 116ml/day of free water.    Levada Schilling, RD, LDN, CDCES Registered Dietitian II Certified Diabetes Care and Education Specialist Please refer to [facility] for RD and/or RD on-call/weekend/after hours pager

## 2022-12-29 NOTE — Progress Notes (Signed)
Nepro @70ml /hr x 16 hrs (from 1800-1000)   Free water flushes 50ml q4 hours to maintain tube patency    Regimen provides 2016kcal/day, 91g/day protein and 1168ml/day of free water.    Rena-vit daily via tube    Juven Fruit Punch BID via tube, each serving provides 95kcal and 2.5g of protein (amino acids glutamine and arginine)  Tumultuous Intrel Course needed as numerous others tried and failed therapies due to nausea, renal disease and complexity of clinical status and commodities and health issues.  The patient has remained stable  on Nepro and would be very risky to alter her plan of care for a different formula now as she is discharging to home.

## 2022-12-29 NOTE — Progress Notes (Addendum)
[facility]  ROUNDING NOTE   Subjective:   Patient seen laying in bed No family present Resting comfortably  Objective:  Vital signs in last 24 hours:  Temp:  [98.1 F (36.7 C)-99.2 F (37.3 C)] 98.2 F (36.8 C) (09/16 0755) Pulse Rate:  [61-63] 63 (09/16 0755) Resp:  [14-16] 14 (09/16 0755) BP: (102-111)/(41-54) 102/41 (09/16 0755) SpO2:  [98 %] 98 % (09/16 0755)  Weight change:  Filed Weights   12/23/22 0855 12/23/22 1250 12/25/22 0500  Weight: 85 kg 84.4 kg 90.3 kg    Intake/Output: I/O last 3 completed shifts: In: 120 [Other:120] Out: -    Intake/Output this shift:  No intake/output data recorded.  Physical Exam: General: Ill appearing  Head: Normocephalic, atraumatic. Moist oral mucosal membranes  Eyes: Anicteric  Lungs:  Diminished bilaterally   Heart: regular   Abdomen:  Soft, nontender  Extremities: 1+ peripheral edema.  Neurologic: Somnolent  Access:  LT permcath placed on 12/19/22    Basic Metabolic Panel: Recent Labs  Lab 12/25/22 0438 12/26/22 0425 12/27/22 0448 12/28/22 0555 12/29/22 0719  NA 136 132* 133* 135 134*  K 4.0 3.9 3.4* 3.5 3.8  CL 97* 95* 96* 96* 99  CO2 28 28 28 26 26   GLUCOSE 88 86 135* 131* 120*  BUN 51* 25* 40* 38* 54*  CREATININE 2.31* 1.76* 2.56* 2.00* 2.50*  CALCIUM 9.2 8.8* 8.9 8.8* 9.4  PHOS 4.1 3.3 4.4 3.7 4.6    Liver Function Tests: Recent Labs  Lab 12/25/22 0438 12/26/22 0425 12/27/22 0448 12/28/22 0555 12/29/22 0719  ALBUMIN 2.6* 2.6* 2.5* 2.4* 2.4*   No results for input(s): "LIPASE", "AMYLASE" in the last 168 hours. No results for input(s): "AMMONIA" in the last 168 hours.  CBC: Recent Labs  Lab 12/23/22 0927 12/27/22 1915 12/28/22 0555  WBC 16.9* 13.6*  --   HGB 9.0* 10.2* 10.2*  HCT 30.2* 33.0*  --   MCV 91.2 88.9  --   PLT 456* 485*  --     Cardiac Enzymes: Recent Labs  Lab 12/26/22 0425  CKTOTAL 15*    BNP: Invalid input(s): "POCBNP"  CBG: Recent Labs  Lab  12/28/22 2024 12/29/22 0012 12/29/22 0503 12/29/22 0756 12/29/22 1130  GLUCAP 97 83 88 127* 132*    Microbiology: Results for orders placed or performed during the hospital encounter of 12/05/22  Culture, blood (Routine x 2)     Status: Abnormal   Collection Time: 12/05/22  8:56 PM   Specimen: BLOOD  Result Value Ref Range Status   Specimen Description   Final    BLOOD BLOOD RIGHT ARM Performed at [facility], [address], [place], [place] [address]    Special Requests   Final    BOTTLES DRAWN AEROBIC AND ANAEROBIC Blood Culture adequate volume Performed at [facility], [address] [place], [place] [address]    Culture  Setup Time   Final    GRAM POSITIVE COCCI IN BOTH AEROBIC AND ANAEROBIC BOTTLES CRITICAL RESULT CALLED TO, READ BACK BY AND VERIFIED WITH: SHEEMA HALLLAJI AT 1206 12/06/22.PMF Performed at [facility], [address] [place], [place] [address]    Culture (A)  Final    STAPHYLOCOCCUS AUREUS SUSCEPTIBILITIES PERFORMED ON PREVIOUS CULTURE WITHIN THE LAST 5 DAYS. Performed at [facility], [address] [place], [place] [address]    Report Status 12/08/2022 FINAL  Final  Culture, blood (Routine x 2)     Status: Abnormal  Collection Time: 12/05/22  8:57 PM   Specimen: BLOOD  Result Value Ref Range Status   Specimen Description   Final    BLOOD BLOOD LEFT ARM Performed at [facility], [address], [place], [place] [address]    Special Requests   Final    BOTTLES DRAWN AEROBIC AND ANAEROBIC Blood Culture adequate volume Performed at [facility], [address] [place], [place] [address]    Culture  Setup Time   Final    GRAM POSITIVE COCCI IN BOTH AEROBIC AND ANAEROBIC BOTTLES CRITICAL RESULT CALLED TO, READ BACK BY AND VERIFIED WITH: [facility] HALLAJI AT 1206 12/06/22.PMF Performed at [facility], [address], [place], [place] [address]    Culture METHICILLIN RESISTANT  STAPHYLOCOCCUS AUREUS (A)  Final   Report Status 12/08/2022 FINAL  Final   Organism ID, Bacteria METHICILLIN RESISTANT STAPHYLOCOCCUS AUREUS  Final      Susceptibility   Methicillin resistant staphylococcus aureus - MIC*    CIPROFLOXACIN >=8 RESISTANT Resistant     ERYTHROMYCIN >=8 RESISTANT Resistant     GENTAMICIN <=0.5 SENSITIVE Sensitive     OXACILLIN >=4 RESISTANT Resistant     TETRACYCLINE <=1 SENSITIVE Sensitive     VANCOMYCIN <=0.5 SENSITIVE Sensitive     TRIMETH/SULFA >=320 RESISTANT Resistant     CLINDAMYCIN <=0.25 SENSITIVE Sensitive     RIFAMPIN <=0.5 SENSITIVE Sensitive     Inducible Clindamycin NEGATIVE Sensitive     LINEZOLID 2 SENSITIVE Sensitive     * METHICILLIN RESISTANT STAPHYLOCOCCUS AUREUS  Blood Culture ID Panel (Reflexed)     Status: Abnormal   Collection Time: 12/05/22  8:57 PM  Result Value Ref Range Status   Enterococcus faecalis NOT DETECTED NOT DETECTED Final   Enterococcus Faecium NOT DETECTED NOT DETECTED Final   Listeria monocytogenes NOT DETECTED NOT DETECTED Final   Staphylococcus species DETECTED (A) NOT DETECTED Final    Comment: CRITICAL RESULT CALLED TO, READ BACK BY AND VERIFIED WITH: SHEEMA HALLAJI AT 1206 12/06/22.PMF    Staphylococcus aureus (BCID) DETECTED (A) NOT DETECTED Final    Comment: Methicillin (oxacillin)-resistant Staphylococcus aureus (MRSA). MRSA is predictably resistant to beta-lactam antibiotics (except ceftaroline). Preferred therapy is vancomycin unless clinically contraindicated. Patient requires contact precautions if  hospitalized. CRITICAL RESULT CALLED TO, READ BACK BY AND VERIFIED WITH: SHEEMA HALLAJI AT 1206 12/06/22.PMF    Staphylococcus epidermidis DETECTED (A) NOT DETECTED Final    Comment: CRITICAL RESULT CALLED TO, READ BACK BY AND VERIFIED WITH: SHEEMA HALLAJI AT 1206 12/06/22.PMF    Staphylococcus lugdunensis NOT DETECTED NOT DETECTED Final   Streptococcus species NOT DETECTED NOT DETECTED Final    Streptococcus agalactiae NOT DETECTED NOT DETECTED Final   Streptococcus pneumoniae NOT DETECTED NOT DETECTED Final   Streptococcus pyogenes NOT DETECTED NOT DETECTED Final   A.calcoaceticus-baumannii NOT DETECTED NOT DETECTED Final   Bacteroides fragilis NOT DETECTED NOT DETECTED Final   Enterobacterales NOT DETECTED NOT DETECTED Final   Enterobacter cloacae complex NOT DETECTED NOT DETECTED Final   Escherichia coli NOT DETECTED NOT DETECTED Final   Klebsiella aerogenes NOT DETECTED NOT DETECTED Final   Klebsiella oxytoca NOT DETECTED NOT DETECTED Final   Klebsiella pneumoniae NOT DETECTED NOT DETECTED Final   Proteus species NOT DETECTED NOT DETECTED Final   Salmonella species NOT DETECTED NOT DETECTED Final   Serratia marcescens NOT DETECTED NOT DETECTED Final   Haemophilus influenzae NOT DETECTED NOT DETECTED Final   Neisseria meningitidis NOT DETECTED NOT DETECTED Final   Pseudomonas aeruginosa  NOT DETECTED NOT DETECTED Final   Stenotrophomonas maltophilia NOT DETECTED NOT DETECTED Final   Candida albicans NOT DETECTED NOT DETECTED Final   Candida auris NOT DETECTED NOT DETECTED Final   Candida glabrata NOT DETECTED NOT DETECTED Final   Candida krusei NOT DETECTED NOT DETECTED Final   Candida parapsilosis NOT DETECTED NOT DETECTED Final   Candida tropicalis NOT DETECTED NOT DETECTED Final   Cryptococcus neoformans/gattii NOT DETECTED NOT DETECTED Final   Methicillin resistance mecA/C DETECTED (A) NOT DETECTED Final    Comment: CRITICAL RESULT CALLED TO, READ BACK BY AND VERIFIED WITH: SHEEMA HALLAJI AT 1206 12/06/22.PMF    Meth resistant mecA/C and MREJ DETECTED (A) NOT DETECTED Final    Comment: CRITICAL RESULT CALLED TO, READ BACK BY AND VERIFIED WITH: SHEEMA HALLAJI AT 1206 12/06/22.PMF Performed at [facility], [address] [place], [place] [address]   Resp panel by RT-PCR (RSV, Flu A&B, Covid) Anterior Nasal Swab     Status: None   Collection Time: 12/05/22   9:53 PM   Specimen: Anterior Nasal Swab  Result Value Ref Range Status   SARS Coronavirus 2 by RT PCR NEGATIVE NEGATIVE Final    Comment: (NOTE) SARS-CoV-2 target nucleic acids are NOT DETECTED.  The SARS-CoV-2 RNA is generally detectable in upper respiratory specimens during the acute phase of infection. The lowest concentration of SARS-CoV-2 viral copies this assay can detect is 138 copies/mL. A negative result does not preclude SARS-Cov-2 infection and should not be used as the sole basis for treatment or other patient management decisions. A negative result may occur with  improper specimen collection/handling, submission of specimen other than nasopharyngeal swab, presence of viral mutation(s) within the areas targeted by this assay, and inadequate number of viral copies(<138 copies/mL). A negative result must be combined with clinical observations, patient history, and epidemiological information. The expected result is Negative.  Fact Sheet for Patients:  BloggerCourse.com  Fact Sheet for Healthcare Providers:  SeriousBroker.it  This test is no t yet approved or cleared by the [place] FDA and  has been authorized for detection and/or diagnosis of SARS-CoV-2 by FDA under an Emergency Use Authorization (EUA). This EUA will remain  in effect (meaning this test can be used) for the duration of the COVID-19 declaration under Section 564(b)(1) of the Act, 21 U.S.C.section 360bbb-3(b)(1), unless the authorization is terminated  or revoked sooner.       Influenza A by PCR NEGATIVE NEGATIVE Final   Influenza B by PCR NEGATIVE NEGATIVE Final    Comment: (NOTE) The Xpert Xpress SARS-CoV-2/FLU/RSV plus assay is intended as an aid in the diagnosis of influenza from Nasopharyngeal swab specimens and should not be used as a sole basis for treatment. Nasal washings and aspirates are unacceptable for Xpert Xpress  SARS-CoV-2/FLU/RSV testing.  Fact Sheet for Patients: BloggerCourse.com  Fact Sheet for Healthcare Providers: SeriousBroker.it  This test is not yet approved or cleared by the [place] FDA and has been authorized for detection and/or diagnosis of SARS-CoV-2 by FDA under an Emergency Use Authorization (EUA). This EUA will remain in effect (meaning this test can be used) for the duration of the COVID-19 declaration under Section 564(b)(1) of the Act, 21 U.S.C. section 360bbb-3(b)(1), unless the authorization is terminated or revoked.     Resp Syncytial Virus by PCR NEGATIVE NEGATIVE Final    Comment: (NOTE) Fact Sheet for Patients: BloggerCourse.com  Fact Sheet for Healthcare Providers: SeriousBroker.it  This test is not yet approved or cleared by the  [place] [employer] trader and has been authorized for detection and/or diagnosis of SARS-CoV-2 by FDA under an TEFL teacher (EUA). This EUA will remain in effect (meaning this test can be used) for the duration of the COVID-19 declaration under Section 564(b)(1) of the Act, 21 U.S.C. section 360bbb-3(b)(1), unless the authorization is terminated or revoked.  Performed at [facility], [address] [place], [place] [address]   MRSA Next Gen by PCR, Nasal     Status: Abnormal   Collection Time: 12/06/22  4:27 AM   Specimen: Nasal Mucosa; Nasal Swab  Result Value Ref Range Status   MRSA by PCR Next Gen DETECTED (A) NOT DETECTED Final    Comment: RESULT CALLED TO, READ BACK BY AND VERIFIED WITH: KRISTINE CHAMBERS AT 6045 12/06/22.PMF (NOTE) The GeneXpert MRSA Assay (FDA approved for NASAL specimens only), is one component of a comprehensive MRSA colonization surveillance program. It is not intended to diagnose MRSA infection nor to guide or monitor treatment for MRSA infections. Test performance is not FDA  approved in patients less than 32 years old. Performed at [facility], [address], [place], [place] [address]   Cath Tip Culture     Status: Abnormal   Collection Time: 12/06/22  2:22 PM   Specimen: Catheter Tip; Other  Result Value Ref Range Status   Specimen Description   Final    CATH TIP Performed at [facility], [address] [place], [place] [address]    Special Requests   Final    NONE Performed at [facility], [address] [place], [place] [address]    Culture (A)  Final    >=100,000 COLONIES/mL METHICILLIN RESISTANT STAPHYLOCOCCUS AUREUS   Report Status 12/08/2022 FINAL  Final   Organism ID, Bacteria METHICILLIN RESISTANT STAPHYLOCOCCUS AUREUS (A)  Final      Susceptibility   Methicillin resistant staphylococcus aureus - MIC*    CIPROFLOXACIN >=8 RESISTANT Resistant     ERYTHROMYCIN >=8 RESISTANT Resistant     GENTAMICIN <=0.5 SENSITIVE Sensitive     OXACILLIN >=4 RESISTANT Resistant     TETRACYCLINE <=1 SENSITIVE Sensitive     VANCOMYCIN <=0.5 SENSITIVE Sensitive     TRIMETH/SULFA >=320 RESISTANT Resistant     CLINDAMYCIN <=0.25 SENSITIVE Sensitive     RIFAMPIN <=0.5 SENSITIVE Sensitive     Inducible Clindamycin NEGATIVE Sensitive     LINEZOLID 2 SENSITIVE Sensitive     * >=100,000 COLONIES/mL METHICILLIN RESISTANT STAPHYLOCOCCUS AUREUS  Urine Culture (for pregnant, neutropenic or urologic patients or patients with an indwelling urinary catheter)     Status: Abnormal   Collection Time: 12/06/22  5:41 PM   Specimen: Urine, Clean Catch  Result Value Ref Range Status   Specimen Description   Final    URINE, CLEAN CATCH Performed at [facility], [address], [place], [place] [address]    Special Requests   Final    Normal Performed at [facility], [address] [place], [place] [address]    Culture (A)  Final    70,000 COLONIES/mL ENTEROCOCCUS FAECALIS 70,000 COLONIES/mL YEAST    Report Status  12/09/2022 FINAL  Final   Organism ID, Bacteria ENTEROCOCCUS FAECALIS (A)  Final      Susceptibility   Enterococcus faecalis - MIC*    AMPICILLIN <=2 SENSITIVE Sensitive     NITROFURANTOIN <=16 SENSITIVE Sensitive     VANCOMYCIN 1 SENSITIVE Sensitive     * 70,000 COLONIES/mL ENTEROCOCCUS FAECALIS  Culture,  blood (Routine X 2) w Reflex to ID Panel     Status: None   Collection Time: 12/07/22  1:31 PM   Specimen: BLOOD  Result Value Ref Range Status   Specimen Description BLOOD BLOOD RIGHT HAND  Final   Special Requests   Final    BOTTLES DRAWN AEROBIC ONLY Blood Culture adequate volume   Culture   Final    NO GROWTH 5 DAYS Performed at [facility], [address] [place], [place] [address]    Report Status 12/12/2022 FINAL  Final  Culture, blood (Routine X 2) w Reflex to ID Panel     Status: None   Collection Time: 12/07/22  6:41 PM   Specimen: BLOOD  Result Value Ref Range Status   Specimen Description BLOOD BLOOD RIGHT HAND  Final   Special Requests   Final    BOTTLES DRAWN AEROBIC AND ANAEROBIC Blood Culture results may not be optimal due to an inadequate volume of blood received in culture bottles   Culture   Final    NO GROWTH 5 DAYS Performed at [facility], [address], [place], [place] [address]    Report Status 12/12/2022 FINAL  Final  C Difficile Quick Screen (NO PCR Reflex)     Status: None   Collection Time: 12/12/22  4:07 PM   Specimen: STOOL  Result Value Ref Range Status   C Diff antigen NEGATIVE NEGATIVE Final   C Diff toxin NEGATIVE NEGATIVE Final   C Diff interpretation No C. difficile detected.  Final    Comment: Performed at [facility], [address] [place], [place] [address]  Gastrointestinal Panel by PCR , Stool     Status: None   Collection Time: 12/13/22  8:38 AM   Specimen: Stool  Result Value Ref Range Status   Campylobacter species NOT DETECTED NOT DETECTED Final   Plesimonas shigelloides NOT DETECTED NOT  DETECTED Final   Salmonella species NOT DETECTED NOT DETECTED Final   Yersinia enterocolitica NOT DETECTED NOT DETECTED Final   Vibrio species NOT DETECTED NOT DETECTED Final   Vibrio cholerae NOT DETECTED NOT DETECTED Final   Enteroaggregative E coli (EAEC) NOT DETECTED NOT DETECTED Final   Enteropathogenic E coli (EPEC) NOT DETECTED NOT DETECTED Final   Enterotoxigenic E coli (ETEC) NOT DETECTED NOT DETECTED Final   Shiga like toxin producing E coli (STEC) NOT DETECTED NOT DETECTED Final   Shigella/Enteroinvasive E coli (EIEC) NOT DETECTED NOT DETECTED Final   Cryptosporidium NOT DETECTED NOT DETECTED Final   Cyclospora cayetanensis NOT DETECTED NOT DETECTED Final   Entamoeba histolytica NOT DETECTED NOT DETECTED Final   Giardia lamblia NOT DETECTED NOT DETECTED Final   Adenovirus F40/41 NOT DETECTED NOT DETECTED Final   Astrovirus NOT DETECTED NOT DETECTED Final   Norovirus GI/GII NOT DETECTED NOT DETECTED Final   Rotavirus A NOT DETECTED NOT DETECTED Final   Sapovirus (I, II, IV, and V) NOT DETECTED NOT DETECTED Final    Comment: Performed at [facility], [address] [place], [place] [address]    Coagulation Studies: No results for input(s): "LABPROT", "INR" in the last 72 hours.   Urinalysis: No results for input(s): "COLORURINE", "LABSPEC", "PHURINE", "GLUCOSEU", "HGBUR", "BILIRUBINUR", "KETONESUR", "PROTEINUR", "UROBILINOGEN", "NITRITE", "LEUKOCYTESUR" in the last 72 hours.  Invalid input(s): "APPERANCEUR"     Imaging: No results found.   Medications:    sodium chloride Stopped (12/15/22 0035)   sodium chloride Stopped (12/08/22 1823)   anticoagulant sodium citrate  DAPTOmycin (CUBICIN) 500 mg in sodium chloride 0.9 % IVPB 500 mg (12/27/22 1226)    sodium chloride   Intravenous Once   amiodarone  200 mg Per Tube BID   ascorbic acid  500 mg Per Tube BID   atorvastatin  40 mg Per Tube Daily   chlorhexidine  15 mL Mouth Rinse BID   Chlorhexidine  Gluconate Cloth  6 each Topical Q0600   vitamin D3  2,000 Units Per Tube Daily   cholestyramine  4 g Per Tube TID   copper  2 mg Per Tube Daily   droxidopa  100 mg Per Tube TID WC   epoetin (EPOGEN/PROCRIT) injection  10,000 Units Intravenous Q T,Th,Sa-HD   escitalopram  10 mg Per Tube Daily   famotidine  10 mg Per Tube Daily   feeding supplement (NEPRO CARB STEADY)  1,120 mL Per Tube Q24H   fludrocortisone  0.2 mg Per Tube Daily   free water  50 mL Per Tube Q4H   heparin injection (subcutaneous)  5,000 Units Subcutaneous Q8H   hydrocortisone cream   Topical TID   hydrOXYzine  100 mg Per Tube BID   insulin aspart  0-6 Units Subcutaneous Q4H   midodrine  20 mg Per Tube TID WC   multivitamin  1 tablet Per Tube QHS   mupirocin ointment   Nasal BID   nutrition supplement (JUVEN)  1 packet Per Tube BID BM   nystatin   Topical TID   mouth rinse  15 mL Mouth Rinse 4 times per day   phenylephrine  10 mg Per J Tube BID   sodium chloride, acetaminophen **OR** acetaminophen, alteplase, alteplase, anticoagulant sodium citrate, artificial tears, clonazePAM, diphenhydrAMINE, diphenoxylate-atropine, fentaNYL (SUBLIMAZE) injection, heparin, ipratropium-albuterol, lidocaine (PF), lidocaine-prilocaine, loperamide HCl, magnesium hydroxide, ondansetron **OR** ondansetron (ZOFRAN) IV, mouth rinse, pentafluoroprop-tetrafluoroeth, traZODone  Assessment/ Plan:  [name] is a 72 y.o.  female with past medical history of diabetes, COPD, GERD, hypertension. OA, a fib, and end stage renal disease on hemodialysis. Patient presents to the ED from her nursing facility complaining of altered mental status. She has been admitted for Lower urinary tract infectious disease [N39.0] Altered mental status, unspecified altered mental status type [R41.82] Sepsis due to gram-negative UTI (HCC) [A41.50, N39.0] Sepsis, due to unspecified organism, unspecified whether acute organ dysfunction present (HCC)  [A41.9]  [facility]/TTS/Rt Permcath/? 81.3kg  End stage renal disease on hemodialysis - Continue TTS schedule - Next treatment scheduled for Tuesday  2. Hypotension due to sepsis. Blood cultures and dialysis cath tip cultures with MRSA, Enterococcus (12/06/2022). Repeat cultures (12/07/2022) with no growth.   - Received pressor support during this admission.   - Continue daptomycin as per ID recommendations.  Recommendations include Daptomycin 500mg  with dialysis until 01/03/23. Outpatient dialysis clinic notified.  - Current regimen of midodrine, phenylephrine and droxidopa  3. Anemia of chronic kidney disease : PRBC transfusion on 8/26, 9/3  - Hgb 10.2 - EPO with HD treatments   4. Secondary Hyperparathyroidism:  with hypophosphatemia.   -hold phosphate binders. .  - Calcium and phosphorus within desired range  5.  Hypokalemia  - continue to monitor with hemodialysis treatments.      LOS: 23 Alexandra Foster 9/16/202411:59 AM

## 2022-12-29 NOTE — Progress Notes (Signed)
Progress Note   Patient: Alexandra Foster HQI:[id number] DOB: February 24, 1951 DOA: 12/05/2022     23 DOS: the patient was seen and examined on 12/29/2022   Brief hospital course: Alexandra Foster is a 72 y.o. female with past medical history significant for asthma, COPD, type diabetes mellitus, s/p G-tube, GERD, ESRD on HD, hypertension, osteoarthritis, paroxysmal atrial fibrillation and OSA on CPAP, who presented to [facility] ED on 12/05/22 from [facility] due to altered mental status with lethargy and decreased responsiveness. The patient's temperature was 102.2 when EMS arrived to this facility. The patient was not responding to questions throughout transport.  Patient had a fever of 101.4 in the emergency room, leukocytosis of 21.4, lactic acid of 2.3. Blood culture on 8/23 positive for MRSA and staph epidermis. 8/23: Presented from [facility] due to AMS, Code Stroke called due to slurred speech and weakness. Deemed not a candidate for thrombolytics due to no clear last known normal time.  Admitted by [facility] with Nephrology consultation for dialysis. 8/24: Rapid response called due to Hypotension prior to/during Hemodialysis.  Transfer to ICU for peripheral vasopressors and initiation of CRRT.  PCCM consulted to assist with vasopressors.  Blood cultures + for MRSA & Staph Epi. ID consulted. Vascular Surgery consulted, permcath removed.  Temporary HD catheter placed. 8/24: this morning with pronounce left facial droop and dysarthria, remains on CRRT on Levo @8  keep MAP>55 12/08/22: patient with multispecies sepsis, removing central line today due to bacteremia. Holding HD as per nephrology team due to shock.  12/09/22-patient with loose watery BM today, she does have skin breakdown over abd pannus.  She had re-initiation of her psychiatric medications today.  S/p pRBC transfusion overnight. Electrolytes are being repleted.  On room air but requires levophed still.  12/10/22- patient laying in bed with  no acute distress.  Levophed weaned to 11mcg/kg/min, afebrile on room air.  Handling NGT feeds well.  Purewick with no UOP this am. 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  12/11/22- patient off levophed , requires HD today, will place catheter per renal team.  Wound care to see for anal wound.  With episode of emesis during HD and concern for possible aspiration. 12/12/22- Back on low dose Levophed, weaning as able. Worsening Leukocytosis, ID plans to check for C.diff.  Ongoing discussions of when to remove newly placed temporary HD catheter. 12/13/22- Remains on levophed gtt despite scheduled midodrine and droxidopa.  HD session completed 12/14/22- No acute events overnight pt remains on levophed gtt.  Received HD. 12/15/22- No acute events overnight, remains on Levophed.  Increase Droxidopa to 300 mg TID. Leukocytosis improving. 12/16/22- No events overnight.  Remains on Levophed (less than yesterday, down to 5 mcg).  Will give 1 unit blood with HD today for Hgb 7.1.  Increase Midodrine to 20 mg TID.  Consult Cardiology for TEE, however pts family declined to proceed with TEE given possible risk associated with procedure  12/17/22: Pt remains on levophed gtt @2mcg /min.  US Venous Bilateral Lower Extremities revealed no evidence of acute or chronic DVT within the right lower extremity. 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    9/11.  Family clearing out space to prepare for her coming home.  Equipment will need to  be delivered. 9/12.  Patient vomited today.  DuoNeb nebulized solution ordered.  1 dose of Zofran given.  Will get chest x-ray and abdominal x-ray.  [name] would  like to cut down to 4 cans of tube feeding today. 9/13.  Dietitian spoke with patient's daughter and they will do continuous feeding at night 9/14.  No further vomiting.  Tolerated tube feedings at night.  Added as needed Lomotil and cholestyramine 3 times a day for diarrhea  Assessment and Plan: * Septic shock (HCC) Present on admission.  Initially required pressors.  Currently on midodrine and Florinef.  Patient also on droxidopa will taper down to 100 mg 3 times daily.  Pharmacy to try to obtain prior authorization for this medication.  Continue daptomycin through 9/21.  MRSA bacteremia MRSA bacteremia, Staph epidermidis bacteremia, Enterococcus faecalis UTI, aspiration pneumonia bilateral lower lobe.  Patient on daptomycin through 921 with dialysis..  Acute metabolic encephalopathy This is improved.  Patient answer simple questions.  Paroxysmal atrial fibrillation (HCC) Continue amiodarone.  Type 2 diabetes mellitus with chronic kidney disease, with long-term current use of insulin (HCC) Last hemoglobin A1c 5.2.  ESRD on hemodialysis (HCC) Continue dialysis as per nephrology  Diarrhea Likely secondary to tube feeds.  Cholestyramine 3 times a day.  Patient already on Imodium as needed and will give as needed Lomotil.  Dysphagia Severe dysphagia secondary to prolonged trach.  Has been on tube feeding since then.    Vomiting Patient on tube feedings at night.  No further vomiting  Anemia of chronic disease Last hemoglobin 10.2  Pressure injury of skin Bilateral buttocks stage II, present on admission.  See full description below.  Obesity (BMI 30-39.9) BMI 33.47  Anxiety and depression Continue Klonopin BuSpar and Lexapro.        Subjective: Patient complains of her eyes being irritated otherwise feels okay.  Wants to get out of the hospital soon as possible.  Having diarrhea.  Admitted with septic shock.  Physical Exam: Vitals:   12/28/22 1531 12/29/22 0010  12/29/22 0707 12/29/22 0755  BP: (!) 110/54 (!) 111/49  (!) 102/41  Pulse: 63 61  63  Resp: 16 16  14   Temp: 99.2 F (37.3 C) 98.1 F (36.7 C)  98.2 F (36.8 C)  TempSrc:      SpO2: 98% 98%  98%  Weight:   88.5 kg   Height:       Physical Exam HENT:     Head: Normocephalic.     Mouth/Throat:     Pharynx: No oropharyngeal exudate.  Eyes:     General: Lids are normal.     Conjunctiva/sclera: Conjunctivae normal.  Cardiovascular:     Rate and Rhythm: Normal rate and regular rhythm.     Heart sounds: Normal heart sounds, S1 normal and S2 normal.  Pulmonary:     Breath sounds: No decreased breath sounds, wheezing, rhonchi or rales.  Abdominal:     Palpations: Abdomen is soft.     Tenderness: There is no abdominal tenderness.  Musculoskeletal:     Right lower leg: No swelling.     Left Lower Extremity: Left leg is amputated above knee.  Skin:    General: Skin is warm.     Findings: No rash.     Comments: Skin irritation on buttock and back.  Neurological:     Mental Status: She is alert.     Comments: Answers questions appropriately.     Data Reviewed: Last 4 sugars 83, 88,  127 132  Family Communication: Spoke with family at the bedside  Disposition: Status is: Inpatient Remains inpatient appropriate because: They are trying to arrange (get approved) the nighttime tube feeding for home.  Planned Discharge Destination: Home with Home Health    Time spent: 28 minutes  Author: Alford Highland, MD 12/29/2022 2:42 PM  For on call review [email or web address].

## 2022-12-30 ENCOUNTER — Other Ambulatory Visit (HOSPITAL_COMMUNITY): Payer: Self-pay

## 2022-12-30 DIAGNOSIS — R112 Nausea with vomiting, unspecified: Secondary | ICD-10-CM | POA: Diagnosis not present

## 2022-12-30 DIAGNOSIS — G9341 Metabolic encephalopathy: Secondary | ICD-10-CM | POA: Diagnosis not present

## 2022-12-30 DIAGNOSIS — I480 Paroxysmal atrial fibrillation: Secondary | ICD-10-CM | POA: Diagnosis not present

## 2022-12-30 DIAGNOSIS — A419 Sepsis, unspecified organism: Secondary | ICD-10-CM | POA: Diagnosis not present

## 2022-12-30 LAB — GLUCOSE, CAPILLARY
Glucose-Capillary: 110 mg/dL — ABNORMAL HIGH (ref 70–99)
Glucose-Capillary: 117 mg/dL — ABNORMAL HIGH (ref 70–99)
Glucose-Capillary: 124 mg/dL — ABNORMAL HIGH (ref 70–99)
Glucose-Capillary: 131 mg/dL — ABNORMAL HIGH (ref 70–99)
Glucose-Capillary: 146 mg/dL — ABNORMAL HIGH (ref 70–99)
Glucose-Capillary: 190 mg/dL — ABNORMAL HIGH (ref 70–99)
Glucose-Capillary: 194 mg/dL — ABNORMAL HIGH (ref 70–99)

## 2022-12-30 LAB — RENAL FUNCTION PANEL
Albumin: 2.7 g/dL — ABNORMAL LOW (ref 3.5–5.0)
Albumin: 2.8 g/dL — ABNORMAL LOW (ref 3.5–5.0)
Anion gap: 12 (ref 5–15)
Anion gap: 15 (ref 5–15)
BUN: 81 mg/dL — ABNORMAL HIGH (ref 8–23)
BUN: 28 mg/dL — ABNORMAL HIGH (ref 8–23)
CO2: 25 mmol/L (ref 22–32)
CO2: 22 mmol/L (ref 22–32)
Calcium: 9.6 mg/dL (ref 8.9–10.3)
Calcium: 9.1 mg/dL (ref 8.9–10.3)
Chloride: 101 mmol/L (ref 98–111)
Chloride: 98 mmol/L (ref 98–111)
Creatinine, Ser: 2.8 mg/dL — ABNORMAL HIGH (ref 0.44–1.00)
Creatinine, Ser: 1.42 mg/dL — ABNORMAL HIGH (ref 0.44–1.00)
GFR, Estimated: 17 mL/min — ABNORMAL LOW (ref >60)
GFR, Estimated: 39 mL/min — ABNORMAL LOW (ref >60)
Glucose, Bld: 116 mg/dL — ABNORMAL HIGH (ref 70–99)
Glucose, Bld: 158 mg/dL — ABNORMAL HIGH (ref 70–99)
Phosphorus: 4.5 mg/dL (ref 2.5–4.6)
Phosphorus: 2.2 mg/dL — ABNORMAL LOW (ref 2.5–4.6)
Potassium: 3.7 mmol/L (ref 3.5–5.1)
Potassium: 3.2 mmol/L — ABNORMAL LOW (ref 3.5–5.1)
Sodium: 138 mmol/L (ref 135–145)
Sodium: 135 mmol/L (ref 135–145)

## 2022-12-30 LAB — CBC
HCT: 36.9 % (ref 36.0–46.0)
HCT: 41.4 % (ref 36.0–46.0)
Hemoglobin: 10.8 g/dL — ABNORMAL LOW (ref 12.0–15.0)
Hemoglobin: 12.5 g/dL (ref 12.0–15.0)
MCH: 26.3 pg (ref 26.0–34.0)
MCH: 27 pg (ref 26.0–34.0)
MCHC: 29.3 g/dL — ABNORMAL LOW (ref 30.0–36.0)
MCHC: 30.2 g/dL (ref 30.0–36.0)
MCV: 90 fL (ref 80.0–100.0)
MCV: 89.4 fL (ref 80.0–100.0)
Platelets: 678 10*3/uL — ABNORMAL HIGH (ref 150–400)
Platelets: 657 10*3/uL — ABNORMAL HIGH (ref 150–400)
RBC: 4.1 MIL/uL (ref 3.87–5.11)
RBC: 4.63 MIL/uL (ref 3.87–5.11)
RDW: 20.4 % — ABNORMAL HIGH (ref 11.5–15.5)
RDW: 20.6 % — ABNORMAL HIGH (ref 11.5–15.5)
WBC: 12.6 10*3/uL — ABNORMAL HIGH (ref 4.0–10.5)
WBC: 23.5 10*3/uL — ABNORMAL HIGH (ref 4.0–10.5)
nRBC: 0.3 % — ABNORMAL HIGH (ref 0.0–0.2)
nRBC: 0.9 % — ABNORMAL HIGH (ref 0.0–0.2)

## 2022-12-30 MED ORDER — DIPHENHYDRAMINE HCL 50 MG/ML IJ SOLN
INTRAMUSCULAR | Status: AC
  Filled 2022-12-30: qty 1

## 2022-12-30 MED ORDER — DIPHENHYDRAMINE HCL 50 MG/ML IJ SOLN
25.0000 mg | Freq: Once | INTRAMUSCULAR | Status: AC
  Administered 2022-12-30: 25 mg via INTRAVENOUS

## 2022-12-30 MED ORDER — HEPARIN SODIUM (PORCINE) 1000 UNIT/ML IJ SOLN
INTRAMUSCULAR | Status: AC
  Filled 2022-12-30: qty 10

## 2022-12-30 MED ORDER — ONDANSETRON HCL 4 MG/2ML IJ SOLN
INTRAMUSCULAR | Status: AC
  Filled 2022-12-30: qty 2

## 2022-12-30 MED ORDER — EPOETIN ALFA 10000 UNIT/ML IJ SOLN
INTRAMUSCULAR | Status: AC
  Filled 2022-12-30: qty 1

## 2022-12-30 NOTE — Plan of Care (Signed)
Problem: Education: Goal: Knowledge of General Education information will improve Description: Including pain rating scale, medication(s)/side effects and non-pharmacologic comfort measures Outcome: Progressing   Problem: Nutrition: Goal: Adequate nutrition will be maintained Outcome: Progressing   Problem: Safety: Goal: Ability to remain free from injury will improve Outcome: Progressing

## 2022-12-30 NOTE — Progress Notes (Signed)
[facility]  ROUNDING NOTE   Subjective:   Patient seen and evaluated during dialysis   HEMODIALYSIS FLOWSHEET:  Blood Flow Rate (mL/min): 300 mL/min Arterial Pressure (mmHg): -180 mmHg Venous Pressure (mmHg): 150 mmHg TMP (mmHg): 9 mmHg Ultrafiltration Rate (mL/min): 660 mL/min Dialysate Flow Rate (mL/min): 300 ml/min Dialysis Fluid Bolus: Normal Saline Bolus Amount (mL): 200 mL  Multiple vomiting episodes during this treatment Zofran and Benadryl given   Objective:  Vital signs in last 24 hours:  Temp:  [97.6 F (36.4 C)-98.3 F (36.8 C)] 97.6 F (36.4 C) (09/17 0755) Pulse Rate:  [58-104] 89 (09/17 1100) Resp:  [14-29] 24 (09/17 1100) BP: (76-156)/(50-136) 156/136 (09/17 1100) SpO2:  [92 %-100 %] 92 % (09/17 1100)  Weight change:  Filed Weights   12/25/22 0500 12/29/22 0707  Weight: 90.3 kg 88.5 kg    Intake/Output: I/O last 3 completed shifts: In: 878.5 [NG/GT:878.5] Out: -    Intake/Output this shift:  No intake/output data recorded.  Physical Exam: General: Ill appearing  Head: Normocephalic, atraumatic. Moist oral mucosal membranes  Eyes: Anicteric  Lungs:  Diminished bilaterally   Heart: regular   Abdomen:  Soft, nontender  Extremities: 1+ peripheral edema.  Neurologic: Alert  Access:  LT permcath placed on 12/19/22    Basic Metabolic Panel: Recent Labs  Lab 12/26/22 0425 12/27/22 0448 12/28/22 0555 12/29/22 0719 12/30/22 0405  NA 132* 133* 135 134* 138  K 3.9 3.4* 3.5 3.8 3.7  CL 95* 96* 96* 99 101  CO2 28 28 26 26 25   GLUCOSE 86 135* 131* 120* 116*  BUN 25* 40* 38* 54* 81*  CREATININE 1.76* 2.56* 2.00* 2.50* 2.80*  CALCIUM 8.8* 8.9 8.8* 9.4 9.6  PHOS 3.3 4.4 3.7 4.6 4.5    Liver Function Tests: Recent Labs  Lab 12/26/22 0425 12/27/22 0448 12/28/22 0555 12/29/22 0719 12/30/22 0405  ALBUMIN 2.6* 2.5* 2.4* 2.4* 2.7*   No results for input(s): "LIPASE", "AMYLASE" in the last 168 hours. No results for input(s):  "AMMONIA" in the last 168 hours.  CBC: Recent Labs  Lab 12/27/22 1915 12/28/22 0555 12/30/22 0405 12/30/22 1034  WBC 13.6*  --  12.6* 23.5*  HGB 10.2* 10.2* 10.8* 12.5  HCT 33.0*  --  36.9 41.4  MCV 88.9  --  90.0 89.4  PLT 485*  --  678* 657*    Cardiac Enzymes: Recent Labs  Lab 12/26/22 0425  CKTOTAL 15*    BNP: Invalid input(s): "POCBNP"  CBG: Recent Labs  Lab 12/29/22 1130 12/29/22 1657 12/29/22 1957 12/30/22 0136 12/30/22 0425  GLUCAP 132* 112* 127* 131* 124*    Microbiology: Results for orders placed or performed during the hospital encounter of 12/05/22  Culture, blood (Routine x 2)     Status: Abnormal   Collection Time: 12/05/22  8:56 PM   Specimen: BLOOD  Result Value Ref Range Status   Specimen Description   Final    BLOOD BLOOD RIGHT ARM Performed at [facility], [address], [place], [place] [address]    Special Requests   Final    BOTTLES DRAWN AEROBIC AND ANAEROBIC Blood Culture adequate volume Performed at [facility], [address] [place], [place] [address]    Culture  Setup Time   Final    GRAM POSITIVE COCCI IN BOTH AEROBIC AND ANAEROBIC BOTTLES CRITICAL RESULT CALLED TO, READ BACK BY AND VERIFIED WITH: SHEEMA HALLLAJI AT 1206 12/06/22.PMF Performed at [facility], [address] [place], [place]  [address]    Culture (A)  Final    STAPHYLOCOCCUS AUREUS SUSCEPTIBILITIES PERFORMED ON PREVIOUS CULTURE WITHIN THE LAST 5 DAYS. Performed at [facility], [address] [place], [place] [address]    Report Status 12/08/2022 FINAL  Final  Culture, blood (Routine x 2)     Status: Abnormal   Collection Time: 12/05/22  8:57 PM   Specimen: BLOOD  Result Value Ref Range Status   Specimen Description   Final    BLOOD BLOOD LEFT ARM Performed at [facility], [address] [place], [place] [address]    Special Requests   Final    BOTTLES DRAWN AEROBIC AND ANAEROBIC Blood Culture  adequate volume Performed at [facility], [address] [place], [place] [address]    Culture  Setup Time   Final    GRAM POSITIVE COCCI IN BOTH AEROBIC AND ANAEROBIC BOTTLES CRITICAL RESULT CALLED TO, READ BACK BY AND VERIFIED WITH: SHEEMA HALLAJI AT 1206 12/06/22.PMF Performed at [facility], [address] [place], [place] [address]    Culture METHICILLIN RESISTANT STAPHYLOCOCCUS AUREUS (A)  Final   Report Status 12/08/2022 FINAL  Final   Organism ID, Bacteria METHICILLIN RESISTANT STAPHYLOCOCCUS AUREUS  Final      Susceptibility   Methicillin resistant staphylococcus aureus - MIC*    CIPROFLOXACIN >=8 RESISTANT Resistant     ERYTHROMYCIN >=8 RESISTANT Resistant     GENTAMICIN <=0.5 SENSITIVE Sensitive     OXACILLIN >=4 RESISTANT Resistant     TETRACYCLINE <=1 SENSITIVE Sensitive     VANCOMYCIN <=0.5 SENSITIVE Sensitive     TRIMETH/SULFA >=320 RESISTANT Resistant     CLINDAMYCIN <=0.25 SENSITIVE Sensitive     RIFAMPIN <=0.5 SENSITIVE Sensitive     Inducible Clindamycin NEGATIVE Sensitive     LINEZOLID 2 SENSITIVE Sensitive     * METHICILLIN RESISTANT STAPHYLOCOCCUS AUREUS  Blood Culture ID Panel (Reflexed)     Status: Abnormal   Collection Time: 12/05/22  8:57 PM  Result Value Ref Range Status   Enterococcus faecalis NOT DETECTED NOT DETECTED Final   Enterococcus Faecium NOT DETECTED NOT DETECTED Final   Listeria monocytogenes NOT DETECTED NOT DETECTED Final   Staphylococcus species DETECTED (A) NOT DETECTED Final    Comment: CRITICAL RESULT CALLED TO, READ BACK BY AND VERIFIED WITH: SHEEMA HALLAJI AT 1206 12/06/22.PMF    Staphylococcus aureus (BCID) DETECTED (A) NOT DETECTED Final    Comment: Methicillin (oxacillin)-resistant Staphylococcus aureus (MRSA). MRSA is predictably resistant to beta-lactam antibiotics (except ceftaroline). Preferred therapy is vancomycin unless clinically contraindicated. Patient requires contact precautions if   hospitalized. CRITICAL RESULT CALLED TO, READ BACK BY AND VERIFIED WITH: SHEEMA HALLAJI AT 1206 12/06/22.PMF    Staphylococcus epidermidis DETECTED (A) NOT DETECTED Final    Comment: CRITICAL RESULT CALLED TO, READ BACK BY AND VERIFIED WITH: SHEEMA HALLAJI AT 1206 12/06/22.PMF    Staphylococcus lugdunensis NOT DETECTED NOT DETECTED Final   Streptococcus species NOT DETECTED NOT DETECTED Final   Streptococcus agalactiae NOT DETECTED NOT DETECTED Final   Streptococcus pneumoniae NOT DETECTED NOT DETECTED Final   Streptococcus pyogenes NOT DETECTED NOT DETECTED Final   A.calcoaceticus-baumannii NOT DETECTED NOT DETECTED Final   Bacteroides fragilis NOT DETECTED NOT DETECTED Final   Enterobacterales NOT DETECTED NOT DETECTED Final   Enterobacter cloacae complex NOT DETECTED NOT DETECTED Final   Escherichia coli NOT DETECTED NOT DETECTED Final   Klebsiella aerogenes NOT DETECTED NOT DETECTED Final   Klebsiella oxytoca NOT DETECTED NOT DETECTED  Final   Klebsiella pneumoniae NOT DETECTED NOT DETECTED Final   Proteus species NOT DETECTED NOT DETECTED Final   Salmonella species NOT DETECTED NOT DETECTED Final   Serratia marcescens NOT DETECTED NOT DETECTED Final   Haemophilus influenzae NOT DETECTED NOT DETECTED Final   Neisseria meningitidis NOT DETECTED NOT DETECTED Final   Pseudomonas aeruginosa NOT DETECTED NOT DETECTED Final   Stenotrophomonas maltophilia NOT DETECTED NOT DETECTED Final   Candida albicans NOT DETECTED NOT DETECTED Final   Candida auris NOT DETECTED NOT DETECTED Final   Candida glabrata NOT DETECTED NOT DETECTED Final   Candida krusei NOT DETECTED NOT DETECTED Final   Candida parapsilosis NOT DETECTED NOT DETECTED Final   Candida tropicalis NOT DETECTED NOT DETECTED Final   Cryptococcus neoformans/gattii NOT DETECTED NOT DETECTED Final   Methicillin resistance mecA/C DETECTED (A) NOT DETECTED Final    Comment: CRITICAL RESULT CALLED TO, READ BACK BY AND VERIFIED  WITH: SHEEMA HALLAJI AT 1206 12/06/22.PMF    Meth resistant mecA/C and MREJ DETECTED (A) NOT DETECTED Final    Comment: CRITICAL RESULT CALLED TO, READ BACK BY AND VERIFIED WITH: SHEEMA HALLAJI AT 1206 12/06/22.PMF Performed at [facility], [address] [place], [place] [address]   Resp panel by RT-PCR (RSV, Flu A&B, Covid) Anterior Nasal Swab     Status: None   Collection Time: 12/05/22  9:53 PM   Specimen: Anterior Nasal Swab  Result Value Ref Range Status   SARS Coronavirus 2 by RT PCR NEGATIVE NEGATIVE Final    Comment: (NOTE) SARS-CoV-2 target nucleic acids are NOT DETECTED.  The SARS-CoV-2 RNA is generally detectable in upper respiratory specimens during the acute phase of infection. The lowest concentration of SARS-CoV-2 viral copies this assay can detect is 138 copies/mL. A negative result does not preclude SARS-Cov-2 infection and should not be used as the sole basis for treatment or other patient management decisions. A negative result may occur with  improper specimen collection/handling, submission of specimen other than nasopharyngeal swab, presence of viral mutation(s) within the areas targeted by this assay, and inadequate number of viral copies(<138 copies/mL). A negative result must be combined with clinical observations, patient history, and epidemiological information. The expected result is Negative.  Fact Sheet for Patients:  BloggerCourse.com  Fact Sheet for Healthcare Providers:  SeriousBroker.it  This test is no t yet approved or cleared by the [place] FDA and  has been authorized for detection and/or diagnosis of SARS-CoV-2 by FDA under an Emergency Use Authorization (EUA). This EUA will remain  in effect (meaning this test can be used) for the duration of the COVID-19 declaration under Section 564(b)(1) of the Act, 21 U.S.C.section 360bbb-3(b)(1), unless the authorization is terminated   or revoked sooner.       Influenza A by PCR NEGATIVE NEGATIVE Final   Influenza B by PCR NEGATIVE NEGATIVE Final    Comment: (NOTE) The Xpert Xpress SARS-CoV-2/FLU/RSV plus assay is intended as an aid in the diagnosis of influenza from Nasopharyngeal swab specimens and should not be used as a sole basis for treatment. Nasal washings and aspirates are unacceptable for Xpert Xpress SARS-CoV-2/FLU/RSV testing.  Fact Sheet for Patients: BloggerCourse.com  Fact Sheet for Healthcare Providers: SeriousBroker.it  This test is not yet approved or cleared by the [place] FDA and has been authorized for detection and/or diagnosis of SARS-CoV-2 by FDA under an Emergency Use Authorization (EUA). This EUA will remain in effect (meaning this test can be used) for the duration of the  COVID-19 declaration under Section 564(b)(1) of the Act, 21 U.S.C. section 360bbb-3(b)(1), unless the authorization is terminated or revoked.     Resp Syncytial Virus by PCR NEGATIVE NEGATIVE Final    Comment: (NOTE) Fact Sheet for Patients: BloggerCourse.com  Fact Sheet for Healthcare Providers: SeriousBroker.it  This test is not yet approved or cleared by the [place] FDA and has been authorized for detection and/or diagnosis of SARS-CoV-2 by FDA under an Emergency Use Authorization (EUA). This EUA will remain in effect (meaning this test can be used) for the duration of the COVID-19 declaration under Section 564(b)(1) of the Act, 21 U.S.C. section 360bbb-3(b)(1), unless the authorization is terminated or revoked.  Performed at [facility], [address] [place], [place] [address]   MRSA Next Gen by PCR, Nasal     Status: Abnormal   Collection Time: 12/06/22  4:27 AM   Specimen: Nasal Mucosa; Nasal Swab  Result Value Ref Range Status   MRSA by PCR Next Gen DETECTED (A) NOT  DETECTED Final    Comment: RESULT CALLED TO, READ BACK BY AND VERIFIED WITH: KRISTINE CHAMBERS AT 8119 12/06/22.PMF (NOTE) The GeneXpert MRSA Assay (FDA approved for NASAL specimens only), is one component of a comprehensive MRSA colonization surveillance program. It is not intended to diagnose MRSA infection nor to guide or monitor treatment for MRSA infections. Test performance is not FDA approved in patients less than 37 years old. Performed at [facility], [address] [place], [place] [address]   Cath Tip Culture     Status: Abnormal   Collection Time: 12/06/22  2:22 PM   Specimen: Catheter Tip; Other  Result Value Ref Range Status   Specimen Description   Final    CATH TIP Performed at [facility], [address] [place], [place] [address]    Special Requests   Final    NONE Performed at [facility], [address] [place], [place] [address]    Culture (A)  Final    >=100,000 COLONIES/mL METHICILLIN RESISTANT STAPHYLOCOCCUS AUREUS   Report Status 12/08/2022 FINAL  Final   Organism ID, Bacteria METHICILLIN RESISTANT STAPHYLOCOCCUS AUREUS (A)  Final      Susceptibility   Methicillin resistant staphylococcus aureus - MIC*    CIPROFLOXACIN >=8 RESISTANT Resistant     ERYTHROMYCIN >=8 RESISTANT Resistant     GENTAMICIN <=0.5 SENSITIVE Sensitive     OXACILLIN >=4 RESISTANT Resistant     TETRACYCLINE <=1 SENSITIVE Sensitive     VANCOMYCIN <=0.5 SENSITIVE Sensitive     TRIMETH/SULFA >=320 RESISTANT Resistant     CLINDAMYCIN <=0.25 SENSITIVE Sensitive     RIFAMPIN <=0.5 SENSITIVE Sensitive     Inducible Clindamycin NEGATIVE Sensitive     LINEZOLID 2 SENSITIVE Sensitive     * >=100,000 COLONIES/mL METHICILLIN RESISTANT STAPHYLOCOCCUS AUREUS  Urine Culture (for pregnant, neutropenic or urologic patients or patients with an indwelling urinary catheter)     Status: Abnormal   Collection Time: 12/06/22  5:41 PM   Specimen: Urine, Clean Catch   Result Value Ref Range Status   Specimen Description   Final    URINE, CLEAN CATCH Performed at [facility], [address] [place], [place] [address]    Special Requests   Final    Normal Performed at [facility], [address] [place], [place] [address]    Culture (A)  Final    70,000 COLONIES/mL ENTEROCOCCUS FAECALIS 70,000 COLONIES/mL YEAST    Report Status 12/09/2022  FINAL  Final   Organism ID, Bacteria ENTEROCOCCUS FAECALIS (A)  Final      Susceptibility   Enterococcus faecalis - MIC*    AMPICILLIN <=2 SENSITIVE Sensitive     NITROFURANTOIN <=16 SENSITIVE Sensitive     VANCOMYCIN 1 SENSITIVE Sensitive     * 70,000 COLONIES/mL ENTEROCOCCUS FAECALIS  Culture, blood (Routine X 2) w Reflex to ID Panel     Status: None   Collection Time: 12/07/22  1:31 PM   Specimen: BLOOD  Result Value Ref Range Status   Specimen Description BLOOD BLOOD RIGHT HAND  Final   Special Requests   Final    BOTTLES DRAWN AEROBIC ONLY Blood Culture adequate volume   Culture   Final    NO GROWTH 5 DAYS Performed at [facility], [address] [place], [place] [address]    Report Status 12/12/2022 FINAL  Final  Culture, blood (Routine X 2) w Reflex to ID Panel     Status: None   Collection Time: 12/07/22  6:41 PM   Specimen: BLOOD  Result Value Ref Range Status   Specimen Description BLOOD BLOOD RIGHT HAND  Final   Special Requests   Final    BOTTLES DRAWN AEROBIC AND ANAEROBIC Blood Culture results may not be optimal due to an inadequate volume of blood received in culture bottles   Culture   Final    NO GROWTH 5 DAYS Performed at [facility], [address] [place], [place] [address]    Report Status 12/12/2022 FINAL  Final  C Difficile Quick Screen (NO PCR Reflex)     Status: None   Collection Time: 12/12/22  4:07 PM   Specimen: STOOL  Result Value Ref Range Status   C Diff antigen NEGATIVE NEGATIVE Final   C Diff toxin NEGATIVE NEGATIVE  Final   C Diff interpretation No C. difficile detected.  Final    Comment: Performed at [facility], [address] [place], [place] [address]  Gastrointestinal Panel by PCR , Stool     Status: None   Collection Time: 12/13/22  8:38 AM   Specimen: Stool  Result Value Ref Range Status   Campylobacter species NOT DETECTED NOT DETECTED Final   Plesimonas shigelloides NOT DETECTED NOT DETECTED Final   Salmonella species NOT DETECTED NOT DETECTED Final   Yersinia enterocolitica NOT DETECTED NOT DETECTED Final   Vibrio species NOT DETECTED NOT DETECTED Final   Vibrio cholerae NOT DETECTED NOT DETECTED Final   Enteroaggregative E coli (EAEC) NOT DETECTED NOT DETECTED Final   Enteropathogenic E coli (EPEC) NOT DETECTED NOT DETECTED Final   Enterotoxigenic E coli (ETEC) NOT DETECTED NOT DETECTED Final   Shiga like toxin producing E coli (STEC) NOT DETECTED NOT DETECTED Final   Shigella/Enteroinvasive E coli (EIEC) NOT DETECTED NOT DETECTED Final   Cryptosporidium NOT DETECTED NOT DETECTED Final   Cyclospora cayetanensis NOT DETECTED NOT DETECTED Final   Entamoeba histolytica NOT DETECTED NOT DETECTED Final   Giardia lamblia NOT DETECTED NOT DETECTED Final   Adenovirus F40/41 NOT DETECTED NOT DETECTED Final   Astrovirus NOT DETECTED NOT DETECTED Final   Norovirus GI/GII NOT DETECTED NOT DETECTED Final   Rotavirus A NOT DETECTED NOT DETECTED Final   Sapovirus (I, II, IV, and V) NOT DETECTED NOT DETECTED Final    Comment: Performed at [facility], [address] [place], [place] [address]    Coagulation Studies: No results for input(s): "LABPROT", "INR" in the last 72 hours.  Urinalysis: No results for input(s): "COLORURINE", "LABSPEC", "PHURINE", "GLUCOSEU", "HGBUR", "BILIRUBINUR", "KETONESUR", "PROTEINUR", "UROBILINOGEN", "NITRITE", "LEUKOCYTESUR" in the last 72 hours.  Invalid input(s): "APPERANCEUR"     Imaging: No results found.   Medications:     sodium chloride Stopped (12/15/22 0035)   sodium chloride Stopped (12/08/22 1823)   anticoagulant sodium citrate     DAPTOmycin (CUBICIN) 500 mg in sodium chloride 0.9 % IVPB 500 mg (12/27/22 1226)    sodium chloride   Intravenous Once   amiodarone  200 mg Per Tube BID   ascorbic acid  500 mg Per Tube BID   atorvastatin  40 mg Per Tube Daily   chlorhexidine  15 mL Mouth Rinse BID   Chlorhexidine Gluconate Cloth  6 each Topical Q0600   vitamin D3  2,000 Units Per Tube Daily   cholestyramine  4 g Per Tube TID   copper  2 mg Per Tube Daily   droxidopa  100 mg Per Tube TID WC   epoetin (EPOGEN/PROCRIT) injection  10,000 Units Intravenous Q T,Th,Sa-HD   escitalopram  10 mg Per Tube Daily   famotidine  10 mg Per Tube Daily   feeding supplement (NEPRO CARB STEADY)  1,120 mL Per Tube Q24H   fludrocortisone  0.2 mg Per Tube Daily   free water  50 mL Per Tube Q4H   heparin injection (subcutaneous)  5,000 Units Subcutaneous Q8H   hydrocortisone cream   Topical TID   hydrOXYzine  100 mg Per Tube BID   insulin aspart  0-6 Units Subcutaneous Q4H   midodrine  20 mg Per Tube TID WC   multivitamin  1 tablet Per Tube QHS   mupirocin ointment   Nasal BID   nutrition supplement (JUVEN)  1 packet Per Tube BID BM   nystatin   Topical TID   phenylephrine  10 mg Per J Tube BID   sodium chloride, acetaminophen **OR** acetaminophen, alteplase, alteplase, anticoagulant sodium citrate, artificial tears, clonazePAM, diphenhydrAMINE, diphenoxylate-atropine, fentaNYL (SUBLIMAZE) injection, heparin, ipratropium-albuterol, lidocaine (PF), lidocaine-prilocaine, loperamide HCl, magnesium hydroxide, ondansetron **OR** ondansetron (ZOFRAN) IV, pentafluoroprop-tetrafluoroeth, traZODone  Assessment/ Plan:  Alexandra Foster is a 72 y.o.  female with past medical history of diabetes, COPD, GERD, hypertension. OA, a fib, and end stage renal disease on hemodialysis. Patient presents to the ED from her nursing  facility complaining of altered mental status. She has been admitted for Lower urinary tract infectious disease [N39.0] Altered mental status, unspecified altered mental status type [R41.82] Sepsis due to gram-negative UTI (HCC) [A41.50, N39.0] Sepsis, due to unspecified organism, unspecified whether acute organ dysfunction present (HCC) [A41.9]  [facility] Walker/TTS/Rt Permcath/? 81.3kg  End stage renal disease on hemodialysis - Continue TTS schedule - Received dialysis today, low to no UF.  - Next treatment scheduled for Thursday   2. Hypotension due to sepsis. Blood cultures and dialysis cath tip cultures with MRSA, Enterococcus (12/06/2022). Repeat cultures (12/07/2022) with no growth.   - Received pressor support during this admission.   - Continue daptomycin as per ID recommendations.  Recommendations include Daptomycin 500mg  with dialysis until 01/03/23. Outpatient dialysis clinic notified.  - Current regimen of midodrine and droxidopa  3. Anemia of chronic kidney disease : PRBC transfusion on 8/26, 9/3  - Hgb 12.5 - EPO with HD treatments   4. Secondary Hyperparathyroidism:  with hypophosphatemia.   -hold phosphate binders.   - Will continue to monitor bone minerals during this admission  5.  Hypokalemia  - continue to monitor with hemodialysis  treatments.      LOS: 24 Abdulahad Mederos 9/17/202411:44 AM

## 2022-12-30 NOTE — Plan of Care (Signed)

## 2022-12-30 NOTE — Progress Notes (Addendum)
  Received patient in bed to unit.   Informed consent signed and in chart.    TX duration: 3 hrs 1 min Pt requested to come off tx early. Refused to sign an AMA      Hand-off given to patient's nurse.    Access used: L CVC Access issues: None   Total UF removed: 700 mL Medication(s) given: Epogen, Zofran, benadryl Post HD VS: WDL Post HD weight: UTA     [facility]

## 2022-12-30 NOTE — Telephone Encounter (Signed)
Pharmacy Patient Advocate Encounter  Received notification from [facility] that Prior Authorization for Droxidopa 100MG  capsules  has been APPROVED from 12/30/2022 to 04/13/2098. Ran test claim, Copay is $0.00. This test claim was processed through [facility]- copay amounts may vary at other pharmacies due to pharmacy/plan contracts, or as the patient moves through the different stages of their insurance plan.   PA #/Case ID/Reference #: [id number]

## 2022-12-30 NOTE — Progress Notes (Signed)
PT Cancellation Note  Patient Details Name: Alexandra Foster MRN: [id number] DOB: [birth year]   Cancelled Treatment:    Reason Eval/Treat Not Completed: Fatigue/lethargy limiting ability to participate  Pt at dialysis this AM.  Returned in PM and pt asleep.  Awakens briefly but then returns to sleep.  Attempted LE ROM to awaken but pt makes no attempt to assist despite verbal and tactile cues.   Danielle Dess 12/30/2022, 1:40 PM

## 2022-12-30 NOTE — Progress Notes (Signed)
Pt vomited x 3  tube feed on hold for 10 min  pt cleaned up and given 4mg  zofran

## 2022-12-30 NOTE — Progress Notes (Signed)
Progress Note   Patient: Alexandra Foster GMW:[id number] DOB: Sep 26, 1950 DOA: 12/05/2022     24 DOS: the patient was seen and examined on 12/30/2022   Brief hospital course: Carnita Stockstill is a 72 y.o. female with past medical history significant for asthma, COPD, type diabetes mellitus, s/p G-tube, GERD, ESRD on HD, hypertension, osteoarthritis, paroxysmal atrial fibrillation and OSA on CPAP, who presented to [facility] ED on 12/05/22 from [facility] due to altered mental status with lethargy and decreased responsiveness. The patient's temperature was 102.2 when EMS arrived to this facility. The patient was not responding to questions throughout transport.  Patient had a fever of 101.4 in the emergency room, leukocytosis of 21.4, lactic acid of 2.3. Blood culture on 8/23 positive for MRSA and staph epidermis. 8/23: Presented from [facility] due to AMS, Code Stroke called due to slurred speech and weakness. Deemed not a candidate for thrombolytics due to no clear last known normal time.  Admitted by [facility] with Nephrology consultation for dialysis. 8/24: Rapid response called due to Hypotension prior to/during Hemodialysis.  Transfer to ICU for peripheral vasopressors and initiation of CRRT.  PCCM consulted to assist with vasopressors.  Blood cultures + for MRSA & Staph Epi. ID consulted. Vascular Surgery consulted, permcath removed.  Temporary HD catheter placed. 8/24: this morning with pronounce left facial droop and dysarthria, remains on CRRT on Levo @8  keep MAP>55 12/08/22: patient with multispecies sepsis, removing central line today due to bacteremia. Holding HD as per nephrology team due to shock.  12/09/22-patient with loose watery BM today, she does have skin breakdown over abd pannus.  She had re-initiation of her psychiatric medications today.  S/p pRBC transfusion overnight. Electrolytes are being repleted.  On room air but requires levophed still.  12/10/22- patient laying in bed with  no acute distress.  Levophed weaned to 64mcg/kg/min, afebrile on room air.  Handling NGT feeds well.  Purewick with no UOP this am. 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  12/11/22- patient off levophed , requires HD today, will place catheter per renal team.  Wound care to see for anal wound.  With episode of emesis during HD and concern for possible aspiration. 12/12/22- Back on low dose Levophed, weaning as able. Worsening Leukocytosis, ID plans to check for C.diff.  Ongoing discussions of when to remove newly placed temporary HD catheter. 12/13/22- Remains on levophed gtt despite scheduled midodrine and droxidopa.  HD session completed 12/14/22- No acute events overnight pt remains on levophed gtt.  Received HD. 12/15/22- No acute events overnight, remains on Levophed.  Increase Droxidopa to 300 mg TID. Leukocytosis improving. 12/16/22- No events overnight.  Remains on Levophed (less than yesterday, down to 5 mcg).  Will give 1 unit blood with HD today for Hgb 7.1.  Increase Midodrine to 20 mg TID.  Consult Cardiology for TEE, however pts family declined to proceed with TEE given possible risk associated with procedure  12/17/22: Pt remains on levophed gtt @2mcg /min.  US Venous Bilateral Lower Extremities revealed no evidence of acute or chronic DVT within the right lower extremity. 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    9/11.  Family clearing out space to prepare for her coming home.  Equipment will need to  be delivered. 9/12.  Patient vomited today.  DuoNeb nebulized solution ordered.  1 dose of Zofran given.  Will get chest x-ray and abdominal x-ray.  [name] would  like to cut down to 4 cans of tube feeding today. 9/13.  Dietitian spoke with patient's daughter and they will do continuous feeding at night 9/15.  No further vomiting.  Tolerated tube feedings at night.  Added as needed Lomotil and cholestyramine 3 times a day for diarrhea 9/17.  Patient had vomiting episodes while down on dialysis.  Will hold on discharge today.  Assessment and Plan: * Vomiting Down on dialysis today.  Will like to feedings.  Hold off on discharge today.  Monitor today.  Septic shock (HCC) Present on admission.  Initially required pressors.  Currently on midodrine and Florinef.  Patient also on droxidopa will taper down to 100 mg 3 times daily.  Pharmacy to try to obtain prior authorization for this medication.  Continue daptomycin through 9/21.  MRSA bacteremia MRSA bacteremia, Staph epidermidis bacteremia, Enterococcus faecalis UTI, aspiration pneumonia bilateral lower lobe.  Patient on daptomycin through 921 with dialysis.  Acute metabolic encephalopathy This is improved.  Patient answer simple questions.  Paroxysmal atrial fibrillation (HCC) Continue amiodarone.  Type 2 diabetes mellitus with chronic kidney disease, with long-term current use of insulin (HCC) Last hemoglobin A1c 5.2.  ESRD on hemodialysis (HCC) Continue dialysis as per nephrology  Diarrhea Likely secondary to tube feeds.  Cholestyramine 3 times a day.  Patient already on Imodium as needed and as needed Lomotil.  2 bowel movements documented yesterday.  Dysphagia Severe dysphagia secondary to prolonged trach.  Has been on tube feeding since then.    Anemia of chronic disease Last 12.5 after dialysis session today  Pressure injury of skin Bilateral buttocks stage II, present on admission.  See full description below.  Obesity (BMI 30-39.9) BMI 33.47  Anxiety and depression Continue Klonopin BuSpar and Lexapro.        Subjective: Patient seen near end of dialysis.  Patient asked  to come off early because she did not want to be on any further and then started vomiting prior to coming off dialysis.  No abdominal pain.  2 episodes of diarrhea documented on 9/16.  Physical Exam: Vitals:   12/30/22 1117 12/30/22 1145 12/30/22 1200 12/30/22 1242  BP: (!) 107/55 (!) 137/120 (!) 145/129 (!) 113/57  Pulse: 94 87 81 78  Resp: 15 (!) 21 (!) 22 14  Temp: 97.7 F (36.5 C)   98.3 F (36.8 C)  TempSrc: Axillary     SpO2: 93% 94%  100%  Weight:      Height:       Physical Exam HENT:     Head: Normocephalic.     Mouth/Throat:     Pharynx: No oropharyngeal exudate.  Eyes:     General: Lids are normal.     Conjunctiva/sclera: Conjunctivae normal.  Cardiovascular:     Rate and Rhythm: Normal rate and regular rhythm.     Heart sounds: Normal heart sounds, S1 normal and S2 normal.  Pulmonary:     Breath sounds: No decreased breath sounds, wheezing, rhonchi or rales.  Abdominal:     Palpations: Abdomen is soft.     Tenderness: There is no abdominal tenderness.  Musculoskeletal:     Right lower leg: No swelling.     Left Lower Extremity: Left leg is amputated above knee.  Skin:    General: Skin is warm.  Findings: No rash.  Neurological:     Mental Status: She is alert.     Comments: Answers simple questions appropriately.     Data Reviewed: Creatinine 1.42, potassium 3.2, albumin 2.8, white blood cell count 23.5 after vomiting (this morning prior to vomiting was 12.6).  Hemoglobin up to 12.5 after dialysis.  Family Communication: Spoke with daughter on the phone  Disposition: Status is: Inpatient Remains inpatient appropriate because: With vomiting numerous episodes with dialysis will hold off on discharge today.  Reassess tomorrow.  Planned Discharge Destination: Home with Home Health    Time spent: 28 minutes Case discussed with nephrology and TOC.  Author: Alford Highland, MD 12/30/2022 2:20 PM  For on call review [email or web address].

## 2022-12-31 ENCOUNTER — Inpatient Hospital Stay: Payer: Medicare Other

## 2022-12-31 DIAGNOSIS — R1310 Dysphagia, unspecified: Secondary | ICD-10-CM | POA: Diagnosis not present

## 2022-12-31 DIAGNOSIS — E1122 Type 2 diabetes mellitus with diabetic chronic kidney disease: Secondary | ICD-10-CM | POA: Diagnosis not present

## 2022-12-31 DIAGNOSIS — A419 Sepsis, unspecified organism: Secondary | ICD-10-CM | POA: Diagnosis not present

## 2022-12-31 DIAGNOSIS — N185 Chronic kidney disease, stage 5: Secondary | ICD-10-CM

## 2022-12-31 DIAGNOSIS — R112 Nausea with vomiting, unspecified: Secondary | ICD-10-CM | POA: Diagnosis not present

## 2022-12-31 LAB — GLUCOSE, CAPILLARY
Glucose-Capillary: 121 mg/dL — ABNORMAL HIGH (ref 70–99)
Glucose-Capillary: 151 mg/dL — ABNORMAL HIGH (ref 70–99)
Glucose-Capillary: 121 mg/dL — ABNORMAL HIGH (ref 70–99)
Glucose-Capillary: 103 mg/dL — ABNORMAL HIGH (ref 70–99)
Glucose-Capillary: 113 mg/dL — ABNORMAL HIGH (ref 70–99)
Glucose-Capillary: 114 mg/dL — ABNORMAL HIGH (ref 70–99)

## 2022-12-31 NOTE — Evaluation (Signed)
Occupational Therapy Re-Evaluation Patient Details Name: Alexandra Foster MRN: [id number] DOB: 20-May-1950 Today's Date: 12/31/2022   History of Present Illness 72 y.o female with PMHx significant for ESRD on HD, L AKA admitted with Acute Metabolic Encephalopathy in setting of Severe Sepsis with Septic Shock due to MRSA Bacteremia from infected right chest Permcath, along with Enterococcus Faecalis UTI.   Clinical Impression   Pt status is much the same; she is at her ADL baseline. Could improve UE function if motivated to do so; likely will require assist from caregiver for completing UE exercises at home; family in room looking forward to [facility] training in-home for optimal ADL function.        If plan is discharge home, recommend the following: Two people to help with walking and/or transfers;A lot of help with bathing/dressing/bathroom;Assistance with cooking/housework;Assist for transportation;Help with stairs or ramp for entrance;Direct supervision/assist for financial management;Direct supervision/assist for medications management    Functional Status Assessment  Patient has had a recent decline in their functional status and demonstrates the ability to make significant improvements in function in a reasonable and predictable amount of time.  Equipment Recommendations  [employer];Hospital bed;Other (comment) (hoyer sling)    Recommendations for Other Services       Precautions / Restrictions Precautions Precautions: Fall Restrictions Weight Bearing Restrictions: No      Mobility Bed Mobility               General bed mobility comments: Pt was in chair during session today. PTA assisted nursing staff with dependent lift to chair prior to session.    Transfers                   General transfer comment: baseline dependent (hoyer) lift.      Balance Overall balance assessment:  (not tested today; pt reclined in chair during OT session.)                                          ADL either performed or assessed with clinical judgement   ADL Overall ADL's : At baseline                                       General ADL Comments: Pt wiped eyes with wet washcloth with set up using L hand; very brief participation. OT attempted to engage pt in using BIL UE with dry washcloth to ease the itching on her skin on neck and upper arms; pt becoming irritated, asking for help; self-limiting. Otherwise, she is total A for all self care; tube feeds only.     Vision Patient Visual Report: No change from baseline       Perception         Praxis         Pertinent Vitals/Pain Pain Assessment Pain Assessment: PAINAD Breathing: normal Negative Vocalization: occasional moan/groan, low speech, negative/disapproving quality Facial Expression: sad, frightened, frown Body Language: tense, distressed pacing, fidgeting Consolability: no need to console PAINAD Score: 3 Pain Location: appearing to be R arm Pain Descriptors / Indicators: Aching Pain Intervention(s): Limited activity within patient's tolerance, Monitored during session     Extremity/Trunk Assessment Upper Extremity Assessment Upper Extremity Assessment: RUE deficits/detail;LUE deficits/detail RUE Deficits / Details: Pt has pain in RUE with ROM; unwilling to  show full AROM; lifts R arm approx 30 degrees shoulder flexion; weak grip/pinch, but functional for lightweight objects. Significant tremor with RUE activity. LUE Deficits / Details: LUE ROM WFL; also with tremor during AROM. Pt's L hand in mitt when OT arrived and departed 2/2 pt scratching with L hand.   Lower Extremity Assessment Lower Extremity Assessment: Defer to PT evaluation       Communication Communication Communication: No apparent difficulties   Cognition Arousal: Alert, Lethargic Behavior During Therapy: Anxious Overall Cognitive Status: History of cognitive impairments - at  baseline                                 General Comments: Pt is at baseline per daughter.     General Comments  Pt on room air throughout session; daughter present and supportive during session; daughter looking forward to [facility] services for training on bed level care at home.    Exercises     Shoulder Instructions      Home Living Family/patient expects to be discharged to:: Private residence                                 Additional Comments: Pt has been at LTC for quite some time but family wants to take pt home at hospital discharge and they are able to provide 24/7 assist.      Prior Functioning/Environment Prior Level of Function : Needs assist             Mobility Comments: Hoyer lift transfer at baseline at LTC ADLs Comments: Pt is total A for self care from bed level, tube feedings at baseline, and can wash face with set up A as well as assisting staff with rolling for care.        OT Problem List: Decreased strength;Decreased range of motion      OT Treatment/Interventions: Therapeutic exercise;Patient/family education    OT Goals(Current goals can be found in the care plan section) Acute Rehab OT Goals Patient Stated Goal: To take pt home OT Goal Formulation: With family Time For Goal Achievement: 01/14/23 Potential to Achieve Goals: Fair  OT Frequency: Min 1X/week    Co-evaluation              AM-PAC OT "6 Clicks" Daily Activity     Outcome Measure Help from another person eating meals?: Total Help from another person taking care of personal grooming?: A Little Help from another person toileting, which includes using toliet, bedpan, or urinal?: Total Help from another person bathing (including washing, rinsing, drying)?: Total Help from another person to put on and taking off regular upper body clothing?: Total Help from another person to put on and taking off regular lower body clothing?: Total 6 Click Score: 8    End of Session Nurse Communication: Mobility status  Activity Tolerance: Other (comment) (limited by anxiety) Patient left: in chair;with call bell/phone within reach;with family/visitor present  OT Visit Diagnosis: Muscle weakness (generalized) (M62.81)                Time: 4098-1191 OT Time Calculation (min): 16 min Charges:  OT General Charges $OT Visit: 1 Visit OT Evaluation $OT Re-eval: 1 Re-eval  Linward Foster, MS, OTR/L  Alvester Morin 12/31/2022, 4:25 PM

## 2022-12-31 NOTE — Progress Notes (Signed)
Nutrition Follow-up  DOCUMENTATION CODES:   Obesity unspecified  INTERVENTION:   Continue nocturnal tube feeds:   Nepro @70ml /hr x 16 hrs (from 1800-1000)   Free water flushes 50ml q4 hours to maintain tube patency    Regimen provides 2016kcal/day, 91g/day protein and 1154ml/day of free water.    Rena-vit daily via tube    Juven Fruit Punch BID via tube, each serving provides 95kcal and 2.5g of protein (amino acids glutamine and arginine)   Daily weights   NUTRITION DIAGNOSIS:   Inadequate oral intake related to dysphagia as evidenced by NPO status (pt with chronic G-tube).  Ongoing  GOAL:   Patient will meet greater than or equal to 90% of their needs  Met with TF  MONITOR:   Labs, Weight trends, TF tolerance, Skin, I & O's  REASON FOR ASSESSMENT:   Consult Assessment of nutrition requirement/status  ASSESSMENT:   72 y.o. female with a history of asthma/COPD, HTN, DMII, obesity, OSA on CPAP, SDH, GERD, ESRD on HD, afib, anxiety, depression, possible cirrhosis and a lengthy admission (04/2021) for necrotizing fasciitis, bacteremia and sepsis ( requiring L AKA, tracheostomy and G-tube placement) and who is now admitted with acute metabolic encephalopathy in setting of severe sepsis with septic shock due to MRSA & staph epi bacteremia (suspected from right chest permcath) and UTI.  Reviewed I/O's: -700 ml x 24 hours and +5 L since 12/17/22  TF held yesterday due to episode of vomiting at HD. Plan to resume TF today and possible discharge tomorrow or Friday.   Discussed with RNCM and [employer] regarding home TF needs.   Pt changed to bolus feeds per family request on 9/10. Pt noted to have an episode of nausea/vomiting yesterday; KUB within normal limits. Pt with long suspected gastroparesis. Pt has vomited twice this admission. Pt with chronic intermittent nausea/vomiting. Pt had intermittent nausea/vomiting during a lengthy admission in 2023 where she required  erythromycin. Per SNF, pt with intermittent nausea and vomiting at the facility pta. Pt previously unable to tolerate bolus feeds and was changed to nocturnal feeds at SNF. Nepro is high in fiber and will already digest more slowly. Pt placed on Nepro for volume restriction secondary to ongoing hyponatremia and chronic HD. Pt is at a high readmission risk and change of formula could potentially put pt at high risk for readmission. Pt has been tolerating Nepro well prior to this hospital admission (pt was [facility] to [facility] on 06/13/21). Given pt's history of suspected gastroparesis and intermittent nausea and vomiting, pt would not benefit from change of formula at this time. Additionally, pt with ESRD on HD and K and Phos are stable on currently formula; a standard nutrition formula has higher K and Phos content.   Anticipate long term need for enteral nutrition.   Medications reviewed and include vitamin C, questran, and copper.  Labs reviewed: CBGS: 113-151 (inpatient orders for glycemic control are 0-6 units insulin aspart every 4 hours).    Diet Order:   Diet Order             Diet NPO time specified  Diet effective midnight                   EDUCATION NEEDS:   No education needs have been identified at this time  Skin:  Skin Assessment: Skin Integrity Issues: Skin Integrity Issues:: Stage II Stage II: L/R buttocks  Last BM:  9/13- type 6  Height:   Ht Readings from Last 1 Encounters:  12/05/22 5\' 4"  (1.626 m)    Weight:   Wt Readings from Last 1 Encounters:  12/29/22 88.5 kg   BMI:  Body mass index is 33.47 kg/m.  Estimated Nutritional Needs:   Kcal:  1800-2100kcal/day  Protein:  90-105g/day  Fluid:  UOP +1L    Levada Schilling, RD, LDN, CDCES Registered Dietitian II Certified Diabetes Care and Education Specialist Please refer to AMION for RD and/or RD on-call/weekend/after hours pager

## 2022-12-31 NOTE — TOC Progression Note (Signed)
Transition of Care [facility]) - Progression Note    Patient Details  Name: Alexandra Foster MRN: [id number] Date of Birth: 1950-05-29  Transition of [facility]) CM/SW Contact  Marlowe Sax, RN Phone Number: 12/31/2022, 11:16 AM  Clinical Narrative:     It is reported that the patient is still having episodes of throwing up, I notified Pam with [facility] and Barbara Cower with [facility] that she will not be discharging today  Expected Discharge Plan: Home w Home Health Services Barriers to Discharge: Continued Medical Work up  Expected Discharge Plan and Services     Post Acute Care Choice: Home Health Living arrangements for the past 2 months: Skilled Nursing Facility                           HH Arranged: PT, OT, RN [facility] Agency: [facility] Date HH Agency Contacted: 12/24/22 Time HH Agency Contacted: 1310 Representative spoke with at [facility] Agency: Barbara Cower   Social Determinants of Health (SDOH) Interventions SDOH Screenings   Food Insecurity: Patient Unable To Answer (12/06/2022)  Housing: High Risk (12/06/2022)  Transportation Needs: No Transportation Needs (12/06/2022)  Utilities: Not At Risk (12/06/2022)  Alcohol Screen: Low Risk  (02/11/2021)  Depression (PHQ2-9): Low Risk  (04/30/2021)  Financial Resource Strain: Medium Risk (08/09/2020)  Physical Activity: Inactive (08/09/2020)  Social Connections: Moderately Integrated (08/09/2020)  Stress: No Stress Concern Present (08/09/2020)  Tobacco Use: Medium Risk (12/05/2022)    Readmission Risk Interventions    12/26/2022   11:15 AM  Readmission Risk Prevention Plan  Transportation Screening Complete  Medication Review (RN Care Manager) Complete  PCP or Specialist appointment within 3-5 days of discharge Complete  HRI or Home Care Consult Complete  SW Recovery Care/Counseling Consult Complete  Palliative Care Screening Not Applicable  Skilled Nursing Facility Not Applicable

## 2022-12-31 NOTE — Plan of Care (Signed)

## 2022-12-31 NOTE — Progress Notes (Signed)
[facility]  ROUNDING NOTE   Subjective:   Laying in bed No family present Continues to have nausea and vomiting  Nocturnal tube feeds   Objective:  Vital signs in last 24 hours:  Temp:  [97.7 F (36.5 C)-99.6 F (37.6 C)] 99.6 F (37.6 C) (09/18 0732) Pulse Rate:  [72-94] 72 (09/18 0732) Resp:  [14-24] 18 (09/18 0732) BP: (107-156)/(55-136) 127/62 (09/18 0732) SpO2:  [92 %-100 %] 97 % (09/18 0732)  Weight change:  Filed Weights   12/25/22 0500 12/29/22 0707  Weight: 90.3 kg 88.5 kg    Intake/Output: I/O last 3 completed shifts: In: 878.5 [NG/GT:878.5] Out: 700 [Other:700]   Intake/Output this shift:  No intake/output data recorded.  Physical Exam: General: Ill appearing  Head: Normocephalic, atraumatic. Moist oral mucosal membranes  Eyes: Anicteric  Lungs:  Diminished bilaterally   Heart: regular   Abdomen:  Soft, nontender  Extremities: 1+ peripheral edema.  Neurologic: Alert  Access:  LT permcath placed on 12/19/22    Basic Metabolic Panel: Recent Labs  Lab 12/28/22 0555 12/29/22 0719 12/30/22 0405 12/30/22 1034 12/31/22 0250  NA 135 134* 138 135 136  K 3.5 3.8 3.7 3.2* 4.1  CL 96* 99 101 98 100  CO2 26 26 25 22 27   GLUCOSE 131* 120* 116* 158* 131*  BUN 38* 54* 81* 28* 58*  CREATININE 2.00* 2.50* 2.80* 1.42* 2.49*  CALCIUM 8.8* 9.4 9.6 9.1 9.7  PHOS 3.7 4.6 4.5 2.2* 2.8    Liver Function Tests: Recent Labs  Lab 12/28/22 0555 12/29/22 0719 12/30/22 0405 12/30/22 1034 12/31/22 0250  ALBUMIN 2.4* 2.4* 2.7* 2.8* 2.8*   No results for input(s): "LIPASE", "AMYLASE" in the last 168 hours. No results for input(s): "AMMONIA" in the last 168 hours.  CBC: Recent Labs  Lab 12/27/22 1915 12/28/22 0555 12/30/22 0405 12/30/22 1034  WBC 13.6*  --  12.6* 23.5*  HGB 10.2* 10.2* 10.8* 12.5  HCT 33.0*  --  36.9 41.4  MCV 88.9  --  90.0 89.4  PLT 485*  --  678* 657*    Cardiac Enzymes: Recent Labs  Lab 12/26/22 0425  CKTOTAL  15*    BNP: Invalid input(s): "POCBNP"  CBG: Recent Labs  Lab 12/30/22 2016 12/30/22 2353 12/31/22 0336 12/31/22 0728 12/31/22 1000  GLUCAP 117* 146* 121* 151* 121*    Microbiology: Results for orders placed or performed during the hospital encounter of 12/05/22  Culture, blood (Routine x 2)     Status: Abnormal   Collection Time: 12/05/22  8:56 PM   Specimen: BLOOD  Result Value Ref Range Status   Specimen Description   Final    BLOOD BLOOD RIGHT ARM Performed at [facility], [address], [place], [place] [address]    Special Requests   Final    BOTTLES DRAWN AEROBIC AND ANAEROBIC Blood Culture adequate volume Performed at [facility], [address] [place], [place] [address]    Culture  Setup Time   Final    GRAM POSITIVE COCCI IN BOTH AEROBIC AND ANAEROBIC BOTTLES CRITICAL RESULT CALLED TO, READ BACK BY AND VERIFIED WITH: SHEEMA HALLLAJI AT 1206 12/06/22.PMF Performed at [facility], [address] [place], [place] [address]    Culture (A)  Final    STAPHYLOCOCCUS AUREUS SUSCEPTIBILITIES PERFORMED ON PREVIOUS CULTURE WITHIN THE LAST 5 DAYS. Performed at [facility], [address], [place], [place] [address]    Report Status 12/08/2022 FINAL  Final  Culture,  blood (Routine x 2)     Status: Abnormal   Collection Time: 12/05/22  8:57 PM   Specimen: BLOOD  Result Value Ref Range Status   Specimen Description   Final    BLOOD BLOOD LEFT ARM Performed at [facility], [address] [place], [place] [address]    Special Requests   Final    BOTTLES DRAWN AEROBIC AND ANAEROBIC Blood Culture adequate volume Performed at [facility], [address] [place], [place] [address]    Culture  Setup Time   Final    GRAM POSITIVE COCCI IN BOTH AEROBIC AND ANAEROBIC BOTTLES CRITICAL RESULT CALLED TO, READ BACK BY AND VERIFIED WITH: [facility] HALLAJI AT 1206 12/06/22.PMF Performed at [facility],  [address], [place], [place] [address]    Culture METHICILLIN RESISTANT STAPHYLOCOCCUS AUREUS (A)  Final   Report Status 12/08/2022 FINAL  Final   Organism ID, Bacteria METHICILLIN RESISTANT STAPHYLOCOCCUS AUREUS  Final      Susceptibility   Methicillin resistant staphylococcus aureus - MIC*    CIPROFLOXACIN >=8 RESISTANT Resistant     ERYTHROMYCIN >=8 RESISTANT Resistant     GENTAMICIN <=0.5 SENSITIVE Sensitive     OXACILLIN >=4 RESISTANT Resistant     TETRACYCLINE <=1 SENSITIVE Sensitive     VANCOMYCIN <=0.5 SENSITIVE Sensitive     TRIMETH/SULFA >=320 RESISTANT Resistant     CLINDAMYCIN <=0.25 SENSITIVE Sensitive     RIFAMPIN <=0.5 SENSITIVE Sensitive     Inducible Clindamycin NEGATIVE Sensitive     LINEZOLID 2 SENSITIVE Sensitive     * METHICILLIN RESISTANT STAPHYLOCOCCUS AUREUS  Blood Culture ID Panel (Reflexed)     Status: Abnormal   Collection Time: 12/05/22  8:57 PM  Result Value Ref Range Status   Enterococcus faecalis NOT DETECTED NOT DETECTED Final   Enterococcus Faecium NOT DETECTED NOT DETECTED Final   Listeria monocytogenes NOT DETECTED NOT DETECTED Final   Staphylococcus species DETECTED (A) NOT DETECTED Final    Comment: CRITICAL RESULT CALLED TO, READ BACK BY AND VERIFIED WITH: SHEEMA HALLAJI AT 1206 12/06/22.PMF    Staphylococcus aureus (BCID) DETECTED (A) NOT DETECTED Final    Comment: Methicillin (oxacillin)-resistant Staphylococcus aureus (MRSA). MRSA is predictably resistant to beta-lactam antibiotics (except ceftaroline). Preferred therapy is vancomycin unless clinically contraindicated. Patient requires contact precautions if  hospitalized. CRITICAL RESULT CALLED TO, READ BACK BY AND VERIFIED WITH: SHEEMA HALLAJI AT 1206 12/06/22.PMF    Staphylococcus epidermidis DETECTED (A) NOT DETECTED Final    Comment: CRITICAL RESULT CALLED TO, READ BACK BY AND VERIFIED WITH: SHEEMA HALLAJI AT 1206 12/06/22.PMF    Staphylococcus lugdunensis NOT DETECTED NOT DETECTED  Final   Streptococcus species NOT DETECTED NOT DETECTED Final   Streptococcus agalactiae NOT DETECTED NOT DETECTED Final   Streptococcus pneumoniae NOT DETECTED NOT DETECTED Final   Streptococcus pyogenes NOT DETECTED NOT DETECTED Final   A.calcoaceticus-baumannii NOT DETECTED NOT DETECTED Final   Bacteroides fragilis NOT DETECTED NOT DETECTED Final   Enterobacterales NOT DETECTED NOT DETECTED Final   Enterobacter cloacae complex NOT DETECTED NOT DETECTED Final   Escherichia coli NOT DETECTED NOT DETECTED Final   Klebsiella aerogenes NOT DETECTED NOT DETECTED Final   Klebsiella oxytoca NOT DETECTED NOT DETECTED Final   Klebsiella pneumoniae NOT DETECTED NOT DETECTED Final   Proteus species NOT DETECTED NOT DETECTED Final   Salmonella species NOT DETECTED NOT DETECTED Final   Serratia marcescens NOT DETECTED NOT DETECTED Final   Haemophilus influenzae NOT DETECTED NOT DETECTED Final  Neisseria meningitidis NOT DETECTED NOT DETECTED Final   Pseudomonas aeruginosa NOT DETECTED NOT DETECTED Final   Stenotrophomonas maltophilia NOT DETECTED NOT DETECTED Final   Candida albicans NOT DETECTED NOT DETECTED Final   Candida auris NOT DETECTED NOT DETECTED Final   Candida glabrata NOT DETECTED NOT DETECTED Final   Candida krusei NOT DETECTED NOT DETECTED Final   Candida parapsilosis NOT DETECTED NOT DETECTED Final   Candida tropicalis NOT DETECTED NOT DETECTED Final   Cryptococcus neoformans/gattii NOT DETECTED NOT DETECTED Final   Methicillin resistance mecA/C DETECTED (A) NOT DETECTED Final    Comment: CRITICAL RESULT CALLED TO, READ BACK BY AND VERIFIED WITH: SHEEMA HALLAJI AT 1206 12/06/22.PMF    Meth resistant mecA/C and MREJ DETECTED (A) NOT DETECTED Final    Comment: CRITICAL RESULT CALLED TO, READ BACK BY AND VERIFIED WITH: SHEEMA HALLAJI AT 1206 12/06/22.PMF Performed at [facility], [address] [place], [place] [address]   Resp panel by RT-PCR (RSV, Flu A&B, Covid)  Anterior Nasal Swab     Status: None   Collection Time: 12/05/22  9:53 PM   Specimen: Anterior Nasal Swab  Result Value Ref Range Status   SARS Coronavirus 2 by RT PCR NEGATIVE NEGATIVE Final    Comment: (NOTE) SARS-CoV-2 target nucleic acids are NOT DETECTED.  The SARS-CoV-2 RNA is generally detectable in upper respiratory specimens during the acute phase of infection. The lowest concentration of SARS-CoV-2 viral copies this assay can detect is 138 copies/mL. A negative result does not preclude SARS-Cov-2 infection and should not be used as the sole basis for treatment or other patient management decisions. A negative result may occur with  improper specimen collection/handling, submission of specimen other than nasopharyngeal swab, presence of viral mutation(s) within the areas targeted by this assay, and inadequate number of viral copies(<138 copies/mL). A negative result must be combined with clinical observations, patient history, and epidemiological information. The expected result is Negative.  Fact Sheet for Patients:  BloggerCourse.com  Fact Sheet for Healthcare Providers:  SeriousBroker.it  This test is no t yet approved or cleared by the [place] FDA and  has been authorized for detection and/or diagnosis of SARS-CoV-2 by FDA under an Emergency Use Authorization (EUA). This EUA will remain  in effect (meaning this test can be used) for the duration of the COVID-19 declaration under Section 564(b)(1) of the Act, 21 U.S.C.section 360bbb-3(b)(1), unless the authorization is terminated  or revoked sooner.       Influenza A by PCR NEGATIVE NEGATIVE Final   Influenza B by PCR NEGATIVE NEGATIVE Final    Comment: (NOTE) The Xpert Xpress SARS-CoV-2/FLU/RSV plus assay is intended as an aid in the diagnosis of influenza from Nasopharyngeal swab specimens and should not be used as a sole basis for treatment. Nasal washings  and aspirates are unacceptable for Xpert Xpress SARS-CoV-2/FLU/RSV testing.  Fact Sheet for Patients: BloggerCourse.com  Fact Sheet for Healthcare Providers: SeriousBroker.it  This test is not yet approved or cleared by the [place] FDA and has been authorized for detection and/or diagnosis of SARS-CoV-2 by FDA under an Emergency Use Authorization (EUA). This EUA will remain in effect (meaning this test can be used) for the duration of the COVID-19 declaration under Section 564(b)(1) of the Act, 21 U.S.C. section 360bbb-3(b)(1), unless the authorization is terminated or revoked.     Resp Syncytial Virus by PCR NEGATIVE NEGATIVE Final    Comment: (NOTE) Fact Sheet for Patients: BloggerCourse.com  Fact Sheet for Healthcare Providers: SeriousBroker.it  This test is not yet approved or cleared by the [place] and has been authorized for detection and/or diagnosis of SARS-CoV-2 by FDA under an Emergency Use Authorization (EUA). This EUA will remain in effect (meaning this test can be used) for the duration of the COVID-19 declaration under Section 564(b)(1) of the Act, 21 U.S.C. section 360bbb-3(b)(1), unless the authorization is terminated or revoked.  Performed at [facility], [address] [place], [place] [address]   MRSA Next Gen by PCR, Nasal     Status: Abnormal   Collection Time: 12/06/22  4:27 AM   Specimen: Nasal Mucosa; Nasal Swab  Result Value Ref Range Status   MRSA by PCR Next Gen DETECTED (A) NOT DETECTED Final    Comment: RESULT CALLED TO, READ BACK BY AND VERIFIED WITH: KRISTINE CHAMBERS AT 0981 12/06/22.PMF (NOTE) The GeneXpert MRSA Assay (FDA approved for NASAL specimens only), is one component of a comprehensive MRSA colonization surveillance program. It is not intended to diagnose MRSA infection nor to guide or monitor treatment  for MRSA infections. Test performance is not FDA approved in patients less than 55 years old. Performed at [facility], [address] [place], [place] [address]   Cath Tip Culture     Status: Abnormal   Collection Time: 12/06/22  2:22 PM   Specimen: Catheter Tip; Other  Result Value Ref Range Status   Specimen Description   Final    CATH TIP Performed at [facility], [address] [place], [place] [address]    Special Requests   Final    NONE Performed at [facility], [address] [place], [place] [address]    Culture (A)  Final    >=100,000 COLONIES/mL METHICILLIN RESISTANT STAPHYLOCOCCUS AUREUS   Report Status 12/08/2022 FINAL  Final   Organism ID, Bacteria METHICILLIN RESISTANT STAPHYLOCOCCUS AUREUS (A)  Final      Susceptibility   Methicillin resistant staphylococcus aureus - MIC*    CIPROFLOXACIN >=8 RESISTANT Resistant     ERYTHROMYCIN >=8 RESISTANT Resistant     GENTAMICIN <=0.5 SENSITIVE Sensitive     OXACILLIN >=4 RESISTANT Resistant     TETRACYCLINE <=1 SENSITIVE Sensitive     VANCOMYCIN <=0.5 SENSITIVE Sensitive     TRIMETH/SULFA >=320 RESISTANT Resistant     CLINDAMYCIN <=0.25 SENSITIVE Sensitive     RIFAMPIN <=0.5 SENSITIVE Sensitive     Inducible Clindamycin NEGATIVE Sensitive     LINEZOLID 2 SENSITIVE Sensitive     * >=100,000 COLONIES/mL METHICILLIN RESISTANT STAPHYLOCOCCUS AUREUS  Urine Culture (for pregnant, neutropenic or urologic patients or patients with an indwelling urinary catheter)     Status: Abnormal   Collection Time: 12/06/22  5:41 PM   Specimen: Urine, Clean Catch  Result Value Ref Range Status   Specimen Description   Final    URINE, CLEAN CATCH Performed at [facility], [address] [place], [place] [address]    Special Requests   Final    Normal Performed at [facility], [address] [place], [place] [address]    Culture (A)  Final    70,000 COLONIES/mL ENTEROCOCCUS  FAECALIS 70,000 COLONIES/mL YEAST    Report Status 12/09/2022 FINAL  Final   Organism ID, Bacteria ENTEROCOCCUS FAECALIS (A)  Final      Susceptibility   Enterococcus faecalis - MIC*    AMPICILLIN <=2 SENSITIVE Sensitive     NITROFURANTOIN <=16 SENSITIVE Sensitive     VANCOMYCIN 1 SENSITIVE Sensitive     *  70,000 COLONIES/mL ENTEROCOCCUS FAECALIS  Culture, blood (Routine X 2) w Reflex to ID Panel     Status: None   Collection Time: 12/07/22  1:31 PM   Specimen: BLOOD  Result Value Ref Range Status   Specimen Description BLOOD BLOOD RIGHT HAND  Final   Special Requests   Final    BOTTLES DRAWN AEROBIC ONLY Blood Culture adequate volume   Culture   Final    NO GROWTH 5 DAYS Performed at [facility], [address] [place], [place] [address]    Report Status 12/12/2022 FINAL  Final  Culture, blood (Routine X 2) w Reflex to ID Panel     Status: None   Collection Time: 12/07/22  6:41 PM   Specimen: BLOOD  Result Value Ref Range Status   Specimen Description BLOOD BLOOD RIGHT HAND  Final   Special Requests   Final    BOTTLES DRAWN AEROBIC AND ANAEROBIC Blood Culture results may not be optimal due to an inadequate volume of blood received in culture bottles   Culture   Final    NO GROWTH 5 DAYS Performed at [facility], [address], [place], [place] [address]    Report Status 12/12/2022 FINAL  Final  C Difficile Quick Screen (NO PCR Reflex)     Status: None   Collection Time: 12/12/22  4:07 PM   Specimen: STOOL  Result Value Ref Range Status   C Diff antigen NEGATIVE NEGATIVE Final   C Diff toxin NEGATIVE NEGATIVE Final   C Diff interpretation No C. difficile detected.  Final    Comment: Performed at [facility], [address] [place], [place] [address]  Gastrointestinal Panel by PCR , Stool     Status: None   Collection Time: 12/13/22  8:38 AM   Specimen: Stool  Result Value Ref Range Status   Campylobacter species NOT DETECTED NOT  DETECTED Final   Plesimonas shigelloides NOT DETECTED NOT DETECTED Final   Salmonella species NOT DETECTED NOT DETECTED Final   Yersinia enterocolitica NOT DETECTED NOT DETECTED Final   Vibrio species NOT DETECTED NOT DETECTED Final   Vibrio cholerae NOT DETECTED NOT DETECTED Final   Enteroaggregative E coli (EAEC) NOT DETECTED NOT DETECTED Final   Enteropathogenic E coli (EPEC) NOT DETECTED NOT DETECTED Final   Enterotoxigenic E coli (ETEC) NOT DETECTED NOT DETECTED Final   Shiga like toxin producing E coli (STEC) NOT DETECTED NOT DETECTED Final   Shigella/Enteroinvasive E coli (EIEC) NOT DETECTED NOT DETECTED Final   Cryptosporidium NOT DETECTED NOT DETECTED Final   Cyclospora cayetanensis NOT DETECTED NOT DETECTED Final   Entamoeba histolytica NOT DETECTED NOT DETECTED Final   Giardia lamblia NOT DETECTED NOT DETECTED Final   Adenovirus F40/41 NOT DETECTED NOT DETECTED Final   Astrovirus NOT DETECTED NOT DETECTED Final   Norovirus GI/GII NOT DETECTED NOT DETECTED Final   Rotavirus A NOT DETECTED NOT DETECTED Final   Sapovirus (I, II, IV, and V) NOT DETECTED NOT DETECTED Final    Comment: Performed at [facility], [address] [place], [place] [address]    Coagulation Studies: No results for input(s): "LABPROT", "INR" in the last 72 hours.   Urinalysis: No results for input(s): "COLORURINE", "LABSPEC", "PHURINE", "GLUCOSEU", "HGBUR", "BILIRUBINUR", "KETONESUR", "PROTEINUR", "UROBILINOGEN", "NITRITE", "LEUKOCYTESUR" in the last 72 hours.  Invalid input(s): "APPERANCEUR"     Imaging: No results found.   Medications:    sodium chloride Stopped (12/15/22 0035)   sodium chloride Stopped (12/08/22 1823)  anticoagulant sodium citrate     DAPTOmycin (CUBICIN) 500 mg in sodium chloride 0.9 % IVPB 500 mg (12/30/22 1420)    sodium chloride   Intravenous Once   amiodarone  200 mg Per Tube BID   ascorbic acid  500 mg Per Tube BID   atorvastatin  40 mg Per Tube  Daily   chlorhexidine  15 mL Mouth Rinse BID   Chlorhexidine Gluconate Cloth  6 each Topical Q0600   vitamin D3  2,000 Units Per Tube Daily   cholestyramine  4 g Per Tube TID   copper  2 mg Per Tube Daily   droxidopa  100 mg Per Tube TID WC   epoetin (EPOGEN/PROCRIT) injection  10,000 Units Intravenous Q T,Th,Sa-HD   escitalopram  10 mg Per Tube Daily   famotidine  10 mg Per Tube Daily   feeding supplement (NEPRO CARB STEADY)  1,120 mL Per Tube Q24H   fludrocortisone  0.2 mg Per Tube Daily   free water  50 mL Per Tube Q4H   heparin injection (subcutaneous)  5,000 Units Subcutaneous Q8H   hydrocortisone cream   Topical TID   hydrOXYzine  100 mg Per Tube BID   insulin aspart  0-6 Units Subcutaneous Q4H   midodrine  20 mg Per Tube TID WC   multivitamin  1 tablet Per Tube QHS   mupirocin ointment   Nasal BID   nutrition supplement (JUVEN)  1 packet Per Tube BID BM   nystatin   Topical TID   phenylephrine  10 mg Per J Tube BID   sodium chloride, acetaminophen **OR** acetaminophen, alteplase, anticoagulant sodium citrate, artificial tears, clonazePAM, diphenhydrAMINE, diphenoxylate-atropine, fentaNYL (SUBLIMAZE) injection, heparin, ipratropium-albuterol, lidocaine (PF), lidocaine-prilocaine, loperamide HCl, magnesium hydroxide, ondansetron **OR** ondansetron (ZOFRAN) IV, pentafluoroprop-tetrafluoroeth, traZODone  Assessment/ Plan:  [name] is a 72 y.o.  female with past medical history of diabetes, COPD, GERD, hypertension. OA, a fib, and end stage renal disease on hemodialysis. Patient presents to the ED from her nursing facility complaining of altered mental status. She has been admitted for Lower urinary tract infectious disease [N39.0] Altered mental status, unspecified altered mental status type [R41.82] Sepsis due to gram-negative UTI (HCC) [A41.50, N39.0] Sepsis, due to unspecified organism, unspecified whether acute organ dysfunction present (HCC) [A41.9]  [facility]  [place]/TTS/Rt Permcath/? 81.3kg  End stage renal disease on hemodialysis - Continue TTS schedule - Next treatment scheduled for Thursday   2. Hypotension due to sepsis. Blood cultures and dialysis cath tip cultures with MRSA, Enterococcus (12/06/2022). Repeat cultures (12/07/2022) with no growth.   - Received pressor support during this admission.   - Continue daptomycin as per ID recommendations.  Recommendations include Daptomycin 500mg  with dialysis until 01/03/23. Outpatient dialysis clinic notified.  - Current regimen of midodrine and droxidopa  3. Anemia of chronic kidney disease : PRBC transfusion on 8/26, 9/3  - Hgb 12.5 - EPO with HD treatments   4. Secondary Hyperparathyroidism:  with hypophosphatemia.   -hold phosphate binders.   - Bone minerals acceptable  5.  Hypokalemia  - continue to monitor with hemodialysis treatments.      LOS: 25 Amareon Phung 9/18/202410:42 AM

## 2022-12-31 NOTE — Progress Notes (Signed)
Physical Therapy Treatment Patient Details Name: Alexandra Foster MRN: [id number] DOB: Sep 03, 1950 Today's Date: 12/31/2022   History of Present Illness 72 y.o female with PMHx significant for ESRD on HD, L AKA admitted with Acute Metabolic Encephalopathy in setting of Severe Sepsis with Septic Shock due to MRSA Bacteremia from infected right chest Permcath, along with Enterococcus Faecalis UTI.    PT Comments  Pt was supine in bed upon arrival. RN staff requesting pt sit in recliner x >2 hours to make sure she would be able to tolerate OP HD. Chartered loss adjuster assisted RN staff with hoyer transfer. Once pt was in recliner, pt perform exercises to promote strengthening. See exercises listed below. A lot of education provided to increased activity throughout the day if she hopes to improve abilities and safety with ADLs. Supportive daughter present and will be assisting pt at DC.    If plan is discharge home, recommend the following: A lot of help with bathing/dressing/bathroom;Assistance with cooking/housework;Assist for transportation;Help with stairs or ramp for entrance;Supervision due to cognitive status;Direct supervision/assist for financial management     Equipment Recommendations  Other (comment) (Per TOC/family, pt has all equipment needs met)       Precautions / Restrictions Precautions Precautions: Fall Restrictions Weight Bearing Restrictions: No     Mobility  Bed Mobility  General bed mobility comments: Pt was hoyer lift transfered from bed to recliner. Max asisst to roll R/L for hoyer sling placement    Transfers  General transfer comment: baseline dependent (hoyer) lift.     Balance Overall balance assessment: Needs assistance Sitting-balance support: Bilateral upper extremity supported (R foot support) Sitting balance-Leahy Scale: Poor Sitting balance - Comments: heavy PT assist just to sustain sitting without back support       Cognition Arousal: Alert Behavior During  Therapy: Anxious Overall Cognitive Status: History of cognitive impairments - at baseline    General Comments: Pt is at baseline cognition per daughter.        Exercises General Exercises - Lower Extremity Ankle Circles/Pumps: Right, 10 reps Quad Sets: Right, 10 reps Gluteal Sets: Right, 10 reps Long Arc Quad: AAROM, 10 reps, Right Heel Slides: AROM, 10 reps, Right Hip ABduction/ADduction: AROM, 10 reps, Right Straight Leg Raises: AAROM, 5 reps    General Comments General comments (skin integrity, edema, etc.): Pt on room air throughout session; daughter present and supportive during session; daughter looking forward to [facility] services for training on bed level care at home.      Pertinent Vitals/Pain Pain Assessment Pain Assessment: PAINAD Breathing: normal Negative Vocalization: occasional moan/groan, low speech, negative/disapproving quality Facial Expression: sad, frightened, frown Body Language: tense, distressed pacing, fidgeting Consolability: no need to console PAINAD Score: 3 Pain Descriptors / Indicators: Aching Pain Intervention(s): Limited activity within patient's tolerance, Premedicated before session, Monitored during session, Repositioned    Home Living Family/patient expects to be discharged to:: Private residence    Additional Comments: Pt has been at LTC for quite some time but family wants to take pt home at hospital discharge and they are able to provide 24/7 assist.        PT Goals (current goals can now be found in the care plan section) Acute Rehab PT Goals Patient Stated Goal: to go home Progress towards PT goals: Not progressing toward goals - comment    Frequency    Min 1X/week       AM-PAC PT "6 Clicks" Mobility   Outcome Measure  Help needed turning from your back to  your side while in a flat bed without using bedrails?: A Lot Help needed moving from lying on your back to sitting on the side of a flat bed without using bedrails?:  Total Help needed moving to and from a bed to a chair (including a wheelchair)?: Total Help needed standing up from a chair using your arms (e.g., wheelchair or bedside chair)?: Total Help needed to walk in hospital room?: Total Help needed climbing 3-5 steps with a railing? : Total 6 Click Score: 7    End of Session   Activity Tolerance: Patient tolerated treatment well;Patient limited by fatigue Patient left: in chair;with call bell/phone within reach;with chair alarm set;with family/visitor present;with nursing/sitter in room Nurse Communication: Mobility status PT Visit Diagnosis: Muscle weakness (generalized) (M62.81);Difficulty in walking, not elsewhere classified (R26.2)     Time: 1610-9604 PT Time Calculation (min) (ACUTE ONLY): 14 min  Charges:    $Therapeutic Exercise: 8-22 mins PT General Charges $$ ACUTE PT VISIT: 1 Visit                    Jetta Lout PTA 12/31/22, 5:00 PM

## 2022-12-31 NOTE — Progress Notes (Signed)
[facility]  - [place] at [facility]   PATIENT NAME: Alexandra Foster    MR#:  [id number]  DATE OF BIRTH:  1950/07/12  SUBJECTIVE:  daughter at bedside. Patient had episode of vomiting. Tube feeding held. Had bowel movement. Patient anxious wants to go home. Discussed regarding resuming tube feeding and if tolerates can discharge either tomorrow or Friday. Tomorrow is dialysis day.  VITALS:  Blood pressure 127/62, pulse 72, temperature 99.6 F (37.6 C), resp. rate 18, height 5\' 4"  (1.626 m), weight 88.5 kg, SpO2 97%.  PHYSICAL EXAMINATION:   GENERAL:  72 y.o.-year-old patient with no acute distress. Morbidly obese bedbound LUNGS: Normal breath sounds bilaterally, no wheezing CARDIOVASCULAR: S1, S2 normal. No murmur   ABDOMEN: Soft, nontender, nondistended. PEG+ EXTREMITIES: left BKA NEUROLOGIC: nonfocal  patient is alert and awake SKIN:  Pressure Injury 12/06/22 Buttocks Left Stage 2 -  Partial thickness loss of dermis presenting as a shallow open injury with a red, pink wound bed without slough. (Active)  12/06/22 1800  Location: Buttocks  Location Orientation: Left  Staging: Stage 2 -  Partial thickness loss of dermis presenting as a shallow open injury with a red, pink wound bed without slough.  Wound Description (Comments):   Present on Admission: Yes     Pressure Injury 12/06/22 Buttocks Right Stage 2 -  Partial thickness loss of dermis presenting as a shallow open injury with a red, pink wound bed without slough. (Active)  12/06/22 1800  Location: Buttocks  Location Orientation: Right  Staging: Stage 2 -  Partial thickness loss of dermis presenting as a shallow open injury with a red, pink wound bed without slough.  Wound Description (Comments):   Present on Admission: Yes      LABORATORY PANEL:  CBC Recent Labs  Lab 12/30/22 1034  WBC 23.5*  HGB 12.5  HCT 41.4  PLT 657*    Chemistries  Recent Labs  Lab 12/31/22 0250  NA 136  K 4.1  CL 100   CO2 27  GLUCOSE 131*  BUN 58*  CREATININE 2.49*  CALCIUM 9.7    Assessment and Plan Alexandra Foster is a 72 y.o. female with past medical history significant for asthma, COPD, type diabetes mellitus, s/p G-tube, GERD, ESRD on HD, hypertension, osteoarthritis, paroxysmal atrial fibrillation and OSA on CPAP, who presented to [facility] ED on 12/05/22 from [facility] due to altered mental status with lethargy and decreased responsiveness.   Vomiting No vomintign today--will resume TF at 35 cc/ hr today and if tolerates well then resume regualr rate   Septic shock (HCC) Present on admission.  Initially required pressors.  Currently on midodrine and Florinef.  Patient also on droxidopa will taper down to 100 mg 3 times daily.  Pharmacy to try to obtain prior authorization for this medication.  Continue daptomycin through 9/21.  Blood cultures + for MRSA & Staph Epi on admission  MRSA bacteremia --MRSA bacteremia, Staph epidermidis bacteremia, Enterococcus faecalis UTI, aspiration pneumonia bilateral lower lobe.  Patient on daptomycin through 921 with dialysis.   Acute metabolic encephalopathy --Patient answer simple questions.   Paroxysmal atrial fibrillation (HCC) -Continue amiodarone.   Type 2 diabetes mellitus with chronic kidney disease, with long-term current use of insulin (HCC) Last hemoglobin A1c 5.2.   ESRD on hemodialysis (HCC) Continue dialysis as per nephrology   Diarrhea Likely secondary to tube feeds.  Cholestyramine 3 times a day.  Patient already on Imodium as needed and as needed Lomotil.  2 bowel  movements documented yesterday.   Dysphagia Severe dysphagia secondary to prolonged trach.  Has been on tube feeding since then.     Anemia of chronic disease Last 12.5 after dialysis session today   Pressure injury of skin Bilateral buttocks stage II, present on admission.  See full description below.   Obesity (BMI 30-39.9) BMI 33.47   Anxiety and  depression Continue Klonopin BuSpar and Lexapro.      -- Family communication :dter at bedside CODE STATUS: full DVT Prophylaxis :Heparin Level of care: Med-Surg Status is: Inpatient Remains inpatient appropriate because: vomiting. Need to resume TF    TOTAL TIME TAKING CARE OF THIS PATIENT: 35 minutes.  >50% time spent on counselling and coordination of care  Note: This dictation was prepared with Dragon dictation along with smaller phrase technology. Any transcriptional errors that result from this process are unintentional.  Enedina Finner M.D    [facility]   CC: Primary care physician; Shackleford, Lovell Sheehan, MD

## 2023-01-01 DIAGNOSIS — R1310 Dysphagia, unspecified: Secondary | ICD-10-CM | POA: Diagnosis not present

## 2023-01-01 DIAGNOSIS — R112 Nausea with vomiting, unspecified: Secondary | ICD-10-CM | POA: Diagnosis not present

## 2023-01-01 DIAGNOSIS — E1122 Type 2 diabetes mellitus with diabetic chronic kidney disease: Secondary | ICD-10-CM | POA: Diagnosis not present

## 2023-01-01 DIAGNOSIS — A419 Sepsis, unspecified organism: Secondary | ICD-10-CM | POA: Diagnosis not present

## 2023-01-01 LAB — RENAL FUNCTION PANEL
Albumin: 2.5 g/dL — ABNORMAL LOW (ref 3.5–5.0)
Anion gap: 9 (ref 5–15)
BUN: 81 mg/dL — ABNORMAL HIGH (ref 8–23)
CO2: 24 mmol/L (ref 22–32)
Calcium: 9.5 mg/dL (ref 8.9–10.3)
Chloride: 104 mmol/L (ref 98–111)
Creatinine, Ser: 2.92 mg/dL — ABNORMAL HIGH (ref 0.44–1.00)
GFR, Estimated: 16 mL/min — ABNORMAL LOW (ref >60)
Glucose, Bld: 99 mg/dL (ref 70–99)
Phosphorus: 4.1 mg/dL (ref 2.5–4.6)
Potassium: 3.7 mmol/L (ref 3.5–5.1)
Sodium: 137 mmol/L (ref 135–145)

## 2023-01-01 LAB — GLUCOSE, CAPILLARY
Glucose-Capillary: 108 mg/dL — ABNORMAL HIGH (ref 70–99)
Glucose-Capillary: 108 mg/dL — ABNORMAL HIGH (ref 70–99)
Glucose-Capillary: 114 mg/dL — ABNORMAL HIGH (ref 70–99)
Glucose-Capillary: 122 mg/dL — ABNORMAL HIGH (ref 70–99)
Glucose-Capillary: 89 mg/dL (ref 70–99)
Glucose-Capillary: 98 mg/dL (ref 70–99)
Glucose-Capillary: 99 mg/dL (ref 70–99)

## 2023-01-01 LAB — CBC
HCT: 33.8 % — ABNORMAL LOW (ref 36.0–46.0)
Hemoglobin: 10.4 g/dL — ABNORMAL LOW (ref 12.0–15.0)
MCH: 27.2 pg (ref 26.0–34.0)
MCHC: 30.8 g/dL (ref 30.0–36.0)
MCV: 88.3 fL (ref 80.0–100.0)
Platelets: 661 10*3/uL — ABNORMAL HIGH (ref 150–400)
RBC: 3.83 MIL/uL — ABNORMAL LOW (ref 3.87–5.11)
RDW: 19.7 % — ABNORMAL HIGH (ref 11.5–15.5)
WBC: 17 10*3/uL — ABNORMAL HIGH (ref 4.0–10.5)
nRBC: 0.5 % — ABNORMAL HIGH (ref 0.0–0.2)

## 2023-01-01 MED ORDER — HEPARIN SODIUM (PORCINE) 1000 UNIT/ML IJ SOLN
INTRAMUSCULAR | Status: AC
  Filled 2023-01-01: qty 10

## 2023-01-01 NOTE — Progress Notes (Signed)
PT Cancellation Note  Patient Details Name: Alexandra Foster MRN: [id number] DOB: 1950/04/29   Cancelled Treatment:    Reason Eval/Treat Not Completed: Other (comment).  Pt currently at dialysis.  Will re-attempt PT session at a later date/time.  Hendricks Limes, PT 01/01/23, 2:59 PM

## 2023-01-01 NOTE — Progress Notes (Signed)
Received patient in bed to unit.    Informed consent signed and in chart.    TX duration: 3.5     Transported back to floor  Hand-off given to patient's nurse. Jomarie Longs, LPN    Access used:  CVC Access issues: n/a  Total UF removed:  Medication(s) given: Daptomycin       Maple Hudson, RN Dialysis Unit   3.

## 2023-01-01 NOTE — Progress Notes (Signed)
HD treatment paused, due to high arterial pressure. Dialysis circuit was infused with heparin, patient's LIJ flushed with heparin. Treatment resumed after 15 minutes, with a decrease in blood flow rate. Nephrology team in suite, witnessed to event.

## 2023-01-01 NOTE — Progress Notes (Signed)
[facility]  - [place] at [facility]   PATIENT NAME: Alexandra Foster    MR#:  [id number]  DATE OF BIRTH:  [birth year]  SUBJECTIVE:  no family at bedside during my evaluation.   Patient tolerated tube feeding at half the rate per RN. Denies any complaints at present. Will increase rate today and see if she tolerates. Dialysis this afternoon. VITALS:  Blood pressure 115/77, pulse 68, temperature 97.7 F (36.5 C), temperature source Axillary, resp. rate 17, height 5\' 4"  (1.626 m), weight 89 kg, SpO2 95%.  PHYSICAL EXAMINATION:   GENERAL:  72 y.o.-year-old patient with no acute distress. Morbidly obese bedbound LUNGS: Normal breath sounds bilaterall CARDIOVASCULAR: S1, S2 normal. No murmur   ABDOMEN: Soft PEG+ EXTREMITIES: left BKA NEUROLOGIC: nonfocal  patient is alert and awake SKIN:  Pressure Injury 12/06/22 Buttocks Left Stage 2 -  Partial thickness loss of dermis presenting as a shallow open injury with a red, pink wound bed without slough. (Active)  12/06/22 1800  Location: Buttocks  Location Orientation: Left  Staging: Stage 2 -  Partial thickness loss of dermis presenting as a shallow open injury with a red, pink wound bed without slough.  Wound Description (Comments):   Present on Admission: Yes     Pressure Injury 12/06/22 Buttocks Right Stage 2 -  Partial thickness loss of dermis presenting as a shallow open injury with a red, pink wound bed without slough. (Active)  12/06/22 1800  Location: Buttocks  Location Orientation: Right  Staging: Stage 2 -  Partial thickness loss of dermis presenting as a shallow open injury with a red, pink wound bed without slough.  Wound Description (Comments):   Present on Admission: Yes      LABORATORY PANEL:  CBC Recent Labs  Lab 12/30/22 1034  WBC 23.5*  HGB 12.5  HCT 41.4  PLT 657*    Chemistries  Recent Labs  Lab 12/31/22 0250  NA 136  K 4.1  CL 100  CO2 27  GLUCOSE 131*  BUN 58*  CREATININE 2.49*   CALCIUM 9.7    Assessment and Plan Tagen Ingoldsby Paske is a 72 y.o. female with past medical history significant for asthma, COPD, type diabetes mellitus, s/p G-tube, GERD, ESRD on HD, hypertension, osteoarthritis, paroxysmal atrial fibrillation and OSA on CPAP, who presented to [facility] ED on 12/05/22 from [facility] due to altered mental status with lethargy and decreased responsiveness.   Vomiting No vomintign today--will resume TF at 35 cc/ hr today and if tolerates well then resume regualr rate   Septic shock (HCC) Present on admission.  Initially required pressors.  Currently on midodrine and Florinef.  Patient also on droxidopa will taper down to 100 mg 3 times daily.  Pharmacy to try to obtain prior authorization for this medication.  Continue daptomycin through 9/21.  Blood cultures + for MRSA & Staph Epi on admission  MRSA bacteremia --MRSA bacteremia, Staph epidermidis bacteremia, Enterococcus faecalis UTI, aspiration pneumonia bilateral lower lobe.  Patient on daptomycin through 921 with dialysis.   Acute metabolic encephalopathy --Patient answer simple questions.   Paroxysmal atrial fibrillation (HCC) -Continue amiodarone.   Type 2 diabetes mellitus with chronic kidney disease, with long-term current use of insulin (HCC) Last hemoglobin A1c 5.2.   ESRD on hemodialysis (HCC) Continue dialysis as per nephrology   Diarrhea Likely secondary to tube feeds.  Cholestyramine 3 times a day.  Patient already on Imodium as needed and as needed Lomotil.   Patient is having BM.  Dysphagia Severe dysphagia secondary to prolonged trach.  Has been on tube feeding since then.     Anemia of chronic disease Last 12.5 after dialysis session today   Pressure injury of skin Bilateral buttocks stage II, present on admission.  See full description below.   Obesity (BMI 30-39.9) BMI 33.47   Anxiety and depression Continue Klonopin BuSpar and Lexapro.      Family communication  :none today CODE STATUS: full DVT Prophylaxis :Heparin Level of care: Med-Surg Status is: Inpatient Remains inpatient appropriate because: vomiting. Need to advance TF    TOTAL TIME TAKING CARE OF THIS PATIENT: 35 minutes.  >50% time spent on counselling and coordination of care  Note: This dictation was prepared with Dragon dictation along with smaller phrase technology. Any transcriptional errors that result from this process are unintentional.  Enedina Finner M.D    [facility]   CC: Primary care physician; Shackleford, Lovell Sheehan, MD

## 2023-01-01 NOTE — Progress Notes (Signed)
Occupational Therapy Treatment Patient Details Name: Alexandra Foster MRN: [id number] DOB: 10/09/1950 Today's Date: 01/01/2023   History of present illness Pt. is a 72 y.o female with PMHx significant for ESRD on HD, L AKA admitted with Acute Metabolic Encephalopathy in setting of Severe Sepsis with Septic Shock due to MRSA Bacteremia from infected right chest Permcath, along with Enterococcus Faecalis UTI.   OT comments   Pt. Resting upon arrival. Pt. Easily to wake, and agreeable to treatment. Pt. Performed right shoulder AROM through limited range. Pt. Performed hand to face patterns of movement with the right hand in preparation for grooming tasks. Support for stabilization proximally with the RUE 2/2 tremors. Pt. participated in PROM to the right MP, PIP, and DIPs with emphasis on flexion 2/2 extensor tightness in all joints of the 2nd through 5th digits of the right hand, as well as PROM for thumb abduction to maintain the thumb webspace. PROM was performed to prepare the right hand to hold items during ADLs. Pt. Education was provided about compensatory strategies for tremors. Pt. Continues to benefit from OT services for ADL training, A/E training, There. Ex., and pt./caregiver education.       If plan is discharge home, recommend the following:      Equipment Recommendations  Hoyer lift;Hospital bed;Other (comment)    Recommendations for Other Services      Precautions / Restrictions Precautions Precautions: Fall, contact precautions Restrictions Weight Bearing Restrictions: No        Mobility Bed Mobility               General bed mobility comments: MaxA    Transfers                   General transfer comment: baseline dependent (hoyer) lift.     Balance                                           ADL either performed or assessed with clinical judgement   ADL Overall ADL's : At baseline                                             Extremity/Trunk Assessment Upper Extremity Assessment RUE Deficits / Details: Limited right shoulder range. Pt. With UE tremors, Limited digit MP, PIP, and DIP flexion 2/2 extensor tightness in all digits of the right hand. LUE Deficits / Details: Left hand mitt in place.            Vision Patient Visual Report: No change from baseline     Perception     Praxis      Cognition Arousal: Alert   Overall Cognitive Status: History of cognitive impairments - at baseline                                 General Comments: Pt is at baseline cognition per daughter.        Exercises      Shoulder Instructions       General Comments      Pertinent Vitals/ Pain       Pain Assessment Pain Assessment: No/denies pain  Home Living  Prior Functioning/Environment              Frequency  Min 1X/week        Progress Toward Goals  OT Goals(current goals can now be found in the care plan section)  Progress towards OT goals: Progressing toward goals  Acute Rehab OT Goals Patient Stated Goal: To return home OT Goal Formulation: With family Time For Goal Achievement: 01/14/23 Potential to Achieve Goals: Fair  Plan      Co-evaluation                 AM-PAC OT "6 Clicks" Daily Activity     Outcome Measure   Help from another person eating meals?: Total Help from another person taking care of personal grooming?: A Little Help from another person toileting, which includes using toliet, bedpan, or urinal?: Total Help from another person bathing (including washing, rinsing, drying)?: Total Help from another person to put on and taking off regular upper body clothing?: Total Help from another person to put on and taking off regular lower body clothing?: Total 6 Click Score: 8    End of Session    OT Visit Diagnosis: Muscle weakness (generalized) (M62.81)   Activity  Tolerance     Patient Left with call bell/phone within reach;with bed alarm set   Nurse Communication          Time: 1610-9604 OT Time Calculation (min): 17 min  Charges: OT General Charges $OT Visit: 1 Visit OT Treatments $Self Care/Home Management : 8-22 mins  Olegario Messier, MS, OTR/L   Olegario Messier 01/01/2023, 10:15 AM

## 2023-01-01 NOTE — TOC Progression Note (Signed)
Transition of Care [facility]) - Progression Note    Patient Details  Name: Alexandra Foster MRN: [id number] Date of Birth: Jul 20, 1950  Transition of Care [facility] CM/SW Contact  Marlowe Sax, RN Phone Number: 01/01/2023, 11:46 AM  Clinical Narrative:     The patient tolerated tube feeds last night amount will increase tonight ans if tolerated may DC tomorrow I notified Pam at [employer]  Expected Discharge Plan: Home w Home Health Services Barriers to Discharge: Continued Medical Work up  Expected Discharge Plan and Services     Post Acute Care Choice: Home Health Living arrangements for the past 2 months: Skilled Nursing Facility                           HH Arranged: PT, OT, RN [facility] Agency: [facility] Date HH Agency Contacted: 12/24/22 Time HH Agency Contacted: 1310 Representative spoke with at [facility] Agency: Barbara Cower   Social Determinants of Health (SDOH) Interventions SDOH Screenings   Food Insecurity: Patient Unable To Answer (12/06/2022)  Housing: High Risk (12/06/2022)  Transportation Needs: No Transportation Needs (12/06/2022)  Utilities: Not At Risk (12/06/2022)  Alcohol Screen: Low Risk  (02/11/2021)  Depression (PHQ2-9): Low Risk  (04/30/2021)  Financial Resource Strain: Medium Risk (08/09/2020)  Physical Activity: Inactive (08/09/2020)  Social Connections: Moderately Integrated (08/09/2020)  Stress: No Stress Concern Present (08/09/2020)  Tobacco Use: Medium Risk (12/05/2022)    Readmission Risk Interventions    12/26/2022   11:15 AM  Readmission Risk Prevention Plan  Transportation Screening Complete  Medication Review (RN Care Manager) Complete  PCP or Specialist appointment within 3-5 days of discharge Complete  HRI or Home Care Consult Complete  SW Recovery Care/Counseling Consult Complete  Palliative Care Screening Not Applicable  Skilled Nursing Facility Not Applicable

## 2023-01-01 NOTE — Care Management Important Message (Signed)
Important Message  Patient Details  Name: Alexandra Foster MRN: [id number] Date of Birth: Sep 22, 1950   Medicare Important Message Given:  Other (see comment)  Patient is in an isolation room so I called her room [phone]) to review the Important Message from Medicare but there was no answer, Will try again later.    Olegario Messier A Massiah Minjares 01/01/2023, 10:56 AM

## 2023-01-01 NOTE — Progress Notes (Signed)
[facility]  ROUNDING NOTE   Subjective:   Patient seen resting in bed Preparing for dialysis  States she feels well today Denies vomiting   Objective:  Vital signs in last 24 hours:  Temp:  [97.7 F (36.5 C)-99 F (37.2 C)] 97.7 F (36.5 C) (09/19 0819) Pulse Rate:  [65-68] 68 (09/19 0819) Resp:  [17-20] 17 (09/19 0819) BP: (115-136)/(55-77) 115/77 (09/19 0819) SpO2:  [95 %-100 %] 95 % (09/19 0819) Weight:  [89 kg] 89 kg (09/19 0819)  Weight change:  Filed Weights   12/29/22 0707 01/01/23 0819  Weight: 88.5 kg 89 kg    Intake/Output: No intake/output data recorded.   Intake/Output this shift:  No intake/output data recorded.  Physical Exam: General: Chronically Ill appearing  Head: Normocephalic, atraumatic. Moist oral mucosal membranes  Eyes: Anicteric  Lungs:  Diminished bilaterally   Heart: regular   Abdomen:  Soft, nontender  Extremities: 1+ peripheral edema.  Neurologic: Alert  Access:  LT permcath placed on 12/19/22    Basic Metabolic Panel: Recent Labs  Lab 12/28/22 0555 12/29/22 0719 12/30/22 0405 12/30/22 1034 12/31/22 0250  NA 135 134* 138 135 136  K 3.5 3.8 3.7 3.2* 4.1  CL 96* 99 101 98 100  CO2 26 26 25 22 27   GLUCOSE 131* 120* 116* 158* 131*  BUN 38* 54* 81* 28* 58*  CREATININE 2.00* 2.50* 2.80* 1.42* 2.49*  CALCIUM 8.8* 9.4 9.6 9.1 9.7  PHOS 3.7 4.6 4.5 2.2* 2.8    Liver Function Tests: Recent Labs  Lab 12/28/22 0555 12/29/22 0719 12/30/22 0405 12/30/22 1034 12/31/22 0250  ALBUMIN 2.4* 2.4* 2.7* 2.8* 2.8*   No results for input(s): "LIPASE", "AMYLASE" in the last 168 hours. No results for input(s): "AMMONIA" in the last 168 hours.  CBC: Recent Labs  Lab 12/27/22 1915 12/28/22 0555 12/30/22 0405 12/30/22 1034  WBC 13.6*  --  12.6* 23.5*  HGB 10.2* 10.2* 10.8* 12.5  HCT 33.0*  --  36.9 41.4  MCV 88.9  --  90.0 89.4  PLT 485*  --  678* 657*    Cardiac Enzymes: Recent Labs  Lab 12/26/22 0425   CKTOTAL 15*    BNP: Invalid input(s): "POCBNP"  CBG: Recent Labs  Lab 12/31/22 1659 12/31/22 1958 01/01/23 0018 01/01/23 0431 01/01/23 0823  GLUCAP 113* 114* 98 108* 122*    Microbiology: Results for orders placed or performed during the hospital encounter of 12/05/22  Culture, blood (Routine x 2)     Status: Abnormal   Collection Time: 12/05/22  8:56 PM   Specimen: BLOOD  Result Value Ref Range Status   Specimen Description   Final    BLOOD BLOOD RIGHT ARM Performed at [facility], [address], [place], [place] [address]    Special Requests   Final    BOTTLES DRAWN AEROBIC AND ANAEROBIC Blood Culture adequate volume Performed at [facility], [address], [place], [place] [address]    Culture  Setup Time   Final    GRAM POSITIVE COCCI IN BOTH AEROBIC AND ANAEROBIC BOTTLES CRITICAL RESULT CALLED TO, READ BACK BY AND VERIFIED WITH: SHEEMA HALLLAJI AT 1206 12/06/22.PMF Performed at [facility], [address] [place], [place] [address]    Culture (A)  Final    STAPHYLOCOCCUS AUREUS SUSCEPTIBILITIES PERFORMED ON PREVIOUS CULTURE WITHIN THE LAST 5 DAYS. Performed at [facility], [address], [place], [place] [address]    Report Status 12/08/2022 FINAL  Final  Culture, blood (Routine x 2)     Status: Abnormal   Collection Time: 12/05/22  8:57 PM   Specimen: BLOOD  Result Value Ref Range Status   Specimen Description   Final    BLOOD BLOOD LEFT ARM Performed at [facility], [address] [place], [place] [address]    Special Requests   Final    BOTTLES DRAWN AEROBIC AND ANAEROBIC Blood Culture adequate volume Performed at [facility], [address] [place], [place] [address]    Culture  Setup Time   Final    GRAM POSITIVE COCCI IN BOTH AEROBIC AND ANAEROBIC BOTTLES CRITICAL RESULT CALLED TO, READ BACK BY AND VERIFIED WITH: [facility] HALLAJI AT 1206 12/06/22.PMF Performed at [facility], [address] [place], [place] [address]    Culture METHICILLIN RESISTANT STAPHYLOCOCCUS AUREUS (A)  Final   Report Status 12/08/2022 FINAL  Final   Organism ID, Bacteria METHICILLIN RESISTANT STAPHYLOCOCCUS AUREUS  Final      Susceptibility   Methicillin resistant staphylococcus aureus - MIC*    CIPROFLOXACIN >=8 RESISTANT Resistant     ERYTHROMYCIN >=8 RESISTANT Resistant     GENTAMICIN <=0.5 SENSITIVE Sensitive     OXACILLIN >=4 RESISTANT Resistant     TETRACYCLINE <=1 SENSITIVE Sensitive     VANCOMYCIN <=0.5 SENSITIVE Sensitive     TRIMETH/SULFA >=320 RESISTANT Resistant     CLINDAMYCIN <=0.25 SENSITIVE Sensitive     RIFAMPIN <=0.5 SENSITIVE Sensitive     Inducible Clindamycin NEGATIVE Sensitive     LINEZOLID 2 SENSITIVE Sensitive     * METHICILLIN RESISTANT STAPHYLOCOCCUS AUREUS  Blood Culture ID Panel (Reflexed)     Status: Abnormal   Collection Time: 12/05/22  8:57 PM  Result Value Ref Range Status   Enterococcus faecalis NOT DETECTED NOT DETECTED Final   Enterococcus Faecium NOT DETECTED NOT DETECTED Final   Listeria monocytogenes NOT DETECTED NOT DETECTED Final   Staphylococcus species DETECTED (A) NOT DETECTED Final    Comment: CRITICAL RESULT CALLED TO, READ BACK BY AND VERIFIED WITH: SHEEMA HALLAJI AT 1206 12/06/22.PMF    Staphylococcus aureus (BCID) DETECTED (A) NOT DETECTED Final    Comment: Methicillin (oxacillin)-resistant Staphylococcus aureus (MRSA). MRSA is predictably resistant to beta-lactam antibiotics (except ceftaroline). Preferred therapy is vancomycin unless clinically contraindicated. Patient requires contact precautions if  hospitalized. CRITICAL RESULT CALLED TO, READ BACK BY AND VERIFIED WITH: SHEEMA HALLAJI AT 1206 12/06/22.PMF    Staphylococcus epidermidis DETECTED (A) NOT DETECTED Final    Comment: CRITICAL RESULT CALLED TO, READ BACK BY AND VERIFIED WITH: SHEEMA HALLAJI AT 1206 12/06/22.PMF    Staphylococcus lugdunensis NOT DETECTED NOT DETECTED  Final   Streptococcus species NOT DETECTED NOT DETECTED Final   Streptococcus agalactiae NOT DETECTED NOT DETECTED Final   Streptococcus pneumoniae NOT DETECTED NOT DETECTED Final   Streptococcus pyogenes NOT DETECTED NOT DETECTED Final   A.calcoaceticus-baumannii NOT DETECTED NOT DETECTED Final   Bacteroides fragilis NOT DETECTED NOT DETECTED Final   Enterobacterales NOT DETECTED NOT DETECTED Final   Enterobacter cloacae complex NOT DETECTED NOT DETECTED Final   Escherichia coli NOT DETECTED NOT DETECTED Final   Klebsiella aerogenes NOT DETECTED NOT DETECTED Final   Klebsiella oxytoca NOT DETECTED NOT DETECTED Final   Klebsiella pneumoniae NOT DETECTED NOT DETECTED Final   Proteus species NOT DETECTED NOT DETECTED Final   Salmonella species NOT DETECTED NOT DETECTED Final   Serratia marcescens NOT DETECTED NOT DETECTED Final   Haemophilus influenzae NOT DETECTED NOT DETECTED Final  Neisseria meningitidis NOT DETECTED NOT DETECTED Final   Pseudomonas aeruginosa NOT DETECTED NOT DETECTED Final   Stenotrophomonas maltophilia NOT DETECTED NOT DETECTED Final   Candida albicans NOT DETECTED NOT DETECTED Final   Candida auris NOT DETECTED NOT DETECTED Final   Candida glabrata NOT DETECTED NOT DETECTED Final   Candida krusei NOT DETECTED NOT DETECTED Final   Candida parapsilosis NOT DETECTED NOT DETECTED Final   Candida tropicalis NOT DETECTED NOT DETECTED Final   Cryptococcus neoformans/gattii NOT DETECTED NOT DETECTED Final   Methicillin resistance mecA/C DETECTED (A) NOT DETECTED Final    Comment: CRITICAL RESULT CALLED TO, READ BACK BY AND VERIFIED WITH: SHEEMA HALLAJI AT 1206 12/06/22.PMF    Meth resistant mecA/C and MREJ DETECTED (A) NOT DETECTED Final    Comment: CRITICAL RESULT CALLED TO, READ BACK BY AND VERIFIED WITH: SHEEMA HALLAJI AT 1206 12/06/22.PMF Performed at [facility], [address] [place], [place] [address]   Resp panel by RT-PCR (RSV, Flu A&B, Covid)  Anterior Nasal Swab     Status: None   Collection Time: 12/05/22  9:53 PM   Specimen: Anterior Nasal Swab  Result Value Ref Range Status   SARS Coronavirus 2 by RT PCR NEGATIVE NEGATIVE Final    Comment: (NOTE) SARS-CoV-2 target nucleic acids are NOT DETECTED.  The SARS-CoV-2 RNA is generally detectable in upper respiratory specimens during the acute phase of infection. The lowest concentration of SARS-CoV-2 viral copies this assay can detect is 138 copies/mL. A negative result does not preclude SARS-Cov-2 infection and should not be used as the sole basis for treatment or other patient management decisions. A negative result may occur with  improper specimen collection/handling, submission of specimen other than nasopharyngeal swab, presence of viral mutation(s) within the areas targeted by this assay, and inadequate number of viral copies(<138 copies/mL). A negative result must be combined with clinical observations, patient history, and epidemiological information. The expected result is Negative.  Fact Sheet for Patients:  BloggerCourse.com  Fact Sheet for Healthcare Providers:  SeriousBroker.it  This test is no t yet approved or cleared by the [place] FDA and  has been authorized for detection and/or diagnosis of SARS-CoV-2 by FDA under an Emergency Use Authorization (EUA). This EUA will remain  in effect (meaning this test can be used) for the duration of the COVID-19 declaration under Section 564(b)(1) of the Act, 21 U.S.C.section 360bbb-3(b)(1), unless the authorization is terminated  or revoked sooner.       Influenza A by PCR NEGATIVE NEGATIVE Final   Influenza B by PCR NEGATIVE NEGATIVE Final    Comment: (NOTE) The Xpert Xpress SARS-CoV-2/FLU/RSV plus assay is intended as an aid in the diagnosis of influenza from Nasopharyngeal swab specimens and should not be used as a sole basis for treatment. Nasal washings  and aspirates are unacceptable for Xpert Xpress SARS-CoV-2/FLU/RSV testing.  Fact Sheet for Patients: BloggerCourse.com  Fact Sheet for Healthcare Providers: SeriousBroker.it  This test is not yet approved or cleared by the [place] FDA and has been authorized for detection and/or diagnosis of SARS-CoV-2 by FDA under an Emergency Use Authorization (EUA). This EUA will remain in effect (meaning this test can be used) for the duration of the COVID-19 declaration under Section 564(b)(1) of the Act, 21 U.S.C. section 360bbb-3(b)(1), unless the authorization is terminated or revoked.     Resp Syncytial Virus by PCR NEGATIVE NEGATIVE Final    Comment: (NOTE) Fact Sheet for Patients: BloggerCourse.com  Fact Sheet for Healthcare Providers: SeriousBroker.it  This test is not yet approved or cleared by the [place] and has been authorized for detection and/or diagnosis of SARS-CoV-2 by FDA under an Emergency Use Authorization (EUA). This EUA will remain in effect (meaning this test can be used) for the duration of the COVID-19 declaration under Section 564(b)(1) of the Act, 21 U.S.C. section 360bbb-3(b)(1), unless the authorization is terminated or revoked.  Performed at [facility], [address] [place], [place] [address]   MRSA Next Gen by PCR, Nasal     Status: Abnormal   Collection Time: 12/06/22  4:27 AM   Specimen: Nasal Mucosa; Nasal Swab  Result Value Ref Range Status   MRSA by PCR Next Gen DETECTED (A) NOT DETECTED Final    Comment: RESULT CALLED TO, READ BACK BY AND VERIFIED WITH: KRISTINE CHAMBERS AT 6213 12/06/22.PMF (NOTE) The GeneXpert MRSA Assay (FDA approved for NASAL specimens only), is one component of a comprehensive MRSA colonization surveillance program. It is not intended to diagnose MRSA infection nor to guide or monitor treatment  for MRSA infections. Test performance is not FDA approved in patients less than 69 years old. Performed at [facility], [address] [place], [place] [address]   Cath Tip Culture     Status: Abnormal   Collection Time: 12/06/22  2:22 PM   Specimen: Catheter Tip; Other  Result Value Ref Range Status   Specimen Description   Final    CATH TIP Performed at [facility], [address] [place], [place] [address]    Special Requests   Final    NONE Performed at [facility], [address] [place], [place] [address]    Culture (A)  Final    >=100,000 COLONIES/mL METHICILLIN RESISTANT STAPHYLOCOCCUS AUREUS   Report Status 12/08/2022 FINAL  Final   Organism ID, Bacteria METHICILLIN RESISTANT STAPHYLOCOCCUS AUREUS (A)  Final      Susceptibility   Methicillin resistant staphylococcus aureus - MIC*    CIPROFLOXACIN >=8 RESISTANT Resistant     ERYTHROMYCIN >=8 RESISTANT Resistant     GENTAMICIN <=0.5 SENSITIVE Sensitive     OXACILLIN >=4 RESISTANT Resistant     TETRACYCLINE <=1 SENSITIVE Sensitive     VANCOMYCIN <=0.5 SENSITIVE Sensitive     TRIMETH/SULFA >=320 RESISTANT Resistant     CLINDAMYCIN <=0.25 SENSITIVE Sensitive     RIFAMPIN <=0.5 SENSITIVE Sensitive     Inducible Clindamycin NEGATIVE Sensitive     LINEZOLID 2 SENSITIVE Sensitive     * >=100,000 COLONIES/mL METHICILLIN RESISTANT STAPHYLOCOCCUS AUREUS  Urine Culture (for pregnant, neutropenic or urologic patients or patients with an indwelling urinary catheter)     Status: Abnormal   Collection Time: 12/06/22  5:41 PM   Specimen: Urine, Clean Catch  Result Value Ref Range Status   Specimen Description   Final    URINE, CLEAN CATCH Performed at [facility], [address], [place], [place] [address]    Special Requests   Final    Normal Performed at [facility], [address] [place], [place] [address]    Culture (A)  Final    70,000 COLONIES/mL ENTEROCOCCUS  FAECALIS 70,000 COLONIES/mL YEAST    Report Status 12/09/2022 FINAL  Final   Organism ID, Bacteria ENTEROCOCCUS FAECALIS (A)  Final      Susceptibility   Enterococcus faecalis - MIC*    AMPICILLIN <=2 SENSITIVE Sensitive     NITROFURANTOIN <=16 SENSITIVE Sensitive     VANCOMYCIN 1 SENSITIVE Sensitive     *  70,000 COLONIES/mL ENTEROCOCCUS FAECALIS  Culture, blood (Routine X 2) w Reflex to ID Panel     Status: None   Collection Time: 12/07/22  1:31 PM   Specimen: BLOOD  Result Value Ref Range Status   Specimen Description BLOOD BLOOD RIGHT HAND  Final   Special Requests   Final    BOTTLES DRAWN AEROBIC ONLY Blood Culture adequate volume   Culture   Final    NO GROWTH 5 DAYS Performed at [facility], [address] [place], [place] [address]    Report Status 12/12/2022 FINAL  Final  Culture, blood (Routine X 2) w Reflex to ID Panel     Status: None   Collection Time: 12/07/22  6:41 PM   Specimen: BLOOD  Result Value Ref Range Status   Specimen Description BLOOD BLOOD RIGHT HAND  Final   Special Requests   Final    BOTTLES DRAWN AEROBIC AND ANAEROBIC Blood Culture results may not be optimal due to an inadequate volume of blood received in culture bottles   Culture   Final    NO GROWTH 5 DAYS Performed at [facility], [address], [place], [place] [address]    Report Status 12/12/2022 FINAL  Final  C Difficile Quick Screen (NO PCR Reflex)     Status: None   Collection Time: 12/12/22  4:07 PM   Specimen: STOOL  Result Value Ref Range Status   C Diff antigen NEGATIVE NEGATIVE Final   C Diff toxin NEGATIVE NEGATIVE Final   C Diff interpretation No C. difficile detected.  Final    Comment: Performed at [facility], [address] [place], [place] [address]  Gastrointestinal Panel by PCR , Stool     Status: None   Collection Time: 12/13/22  8:38 AM   Specimen: Stool  Result Value Ref Range Status   Campylobacter species NOT DETECTED NOT  DETECTED Final   Plesimonas shigelloides NOT DETECTED NOT DETECTED Final   Salmonella species NOT DETECTED NOT DETECTED Final   Yersinia enterocolitica NOT DETECTED NOT DETECTED Final   Vibrio species NOT DETECTED NOT DETECTED Final   Vibrio cholerae NOT DETECTED NOT DETECTED Final   Enteroaggregative E coli (EAEC) NOT DETECTED NOT DETECTED Final   Enteropathogenic E coli (EPEC) NOT DETECTED NOT DETECTED Final   Enterotoxigenic E coli (ETEC) NOT DETECTED NOT DETECTED Final   Shiga like toxin producing E coli (STEC) NOT DETECTED NOT DETECTED Final   Shigella/Enteroinvasive E coli (EIEC) NOT DETECTED NOT DETECTED Final   Cryptosporidium NOT DETECTED NOT DETECTED Final   Cyclospora cayetanensis NOT DETECTED NOT DETECTED Final   Entamoeba histolytica NOT DETECTED NOT DETECTED Final   Giardia lamblia NOT DETECTED NOT DETECTED Final   Adenovirus F40/41 NOT DETECTED NOT DETECTED Final   Astrovirus NOT DETECTED NOT DETECTED Final   Norovirus GI/GII NOT DETECTED NOT DETECTED Final   Rotavirus A NOT DETECTED NOT DETECTED Final   Sapovirus (I, II, IV, and V) NOT DETECTED NOT DETECTED Final    Comment: Performed at [facility], [address] [place], [place] [address]    Coagulation Studies: No results for input(s): "LABPROT", "INR" in the last 72 hours.   Urinalysis: No results for input(s): "COLORURINE", "LABSPEC", "PHURINE", "GLUCOSEU", "HGBUR", "BILIRUBINUR", "KETONESUR", "PROTEINUR", "UROBILINOGEN", "NITRITE", "LEUKOCYTESUR" in the last 72 hours.  Invalid input(s): "APPERANCEUR"     Imaging: DG Abd 1 View  Result Date: 12/31/2022 CLINICAL DATA:  Nausea and vomiting EXAM: ABDOMEN - 1 VIEW COMPARISON:  12/25/2022 FINDINGS: Similar gaseous distension of the colon. Overall nonobstructive bowel gas pattern. IVC filter. No gross free intraperitoneal air on supine view. IMPRESSION: Nonobstructive bowel gas pattern. Electronically Signed   By: Duanne Guess D.O.   On: 12/31/2022  18:08     Medications:    sodium chloride Stopped (12/15/22 0035)   sodium chloride Stopped (12/08/22 1823)   anticoagulant sodium citrate     DAPTOmycin (CUBICIN) 500 mg in sodium chloride 0.9 % IVPB 500 mg (12/30/22 1420)    sodium chloride   Intravenous Once   amiodarone  200 mg Per Tube BID   ascorbic acid  500 mg Per Tube BID   atorvastatin  40 mg Per Tube Daily   chlorhexidine  15 mL Mouth Rinse BID   Chlorhexidine Gluconate Cloth  6 each Topical Q0600   vitamin D3  2,000 Units Per Tube Daily   cholestyramine  4 g Per Tube TID   copper  2 mg Per Tube Daily   droxidopa  100 mg Per Tube TID WC   epoetin (EPOGEN/PROCRIT) injection  10,000 Units Intravenous Q T,Th,Sa-HD   escitalopram  10 mg Per Tube Daily   famotidine  10 mg Per Tube Daily   feeding supplement (NEPRO CARB STEADY)  1,120 mL Per Tube Q24H   fludrocortisone  0.2 mg Per Tube Daily   free water  50 mL Per Tube Q4H   heparin injection (subcutaneous)  5,000 Units Subcutaneous Q8H   hydrocortisone cream   Topical TID   hydrOXYzine  100 mg Per Tube BID   insulin aspart  0-6 Units Subcutaneous Q4H   midodrine  20 mg Per Tube TID WC   multivitamin  1 tablet Per Tube QHS   mupirocin ointment   Nasal BID   nutrition supplement (JUVEN)  1 packet Per Tube BID BM   nystatin   Topical TID   phenylephrine  10 mg Per J Tube BID   sodium chloride, acetaminophen **OR** acetaminophen, alteplase, anticoagulant sodium citrate, artificial tears, clonazePAM, diphenhydrAMINE, diphenoxylate-atropine, fentaNYL (SUBLIMAZE) injection, heparin, ipratropium-albuterol, lidocaine (PF), lidocaine-prilocaine, loperamide HCl, magnesium hydroxide, ondansetron **OR** ondansetron (ZOFRAN) IV, pentafluoroprop-tetrafluoroeth, traZODone  Assessment/ Plan:  [name] is a 72 y.o.  female with past medical history of diabetes, COPD, GERD, hypertension. OA, a fib, and end stage renal disease on hemodialysis. Patient presents to the ED from  her nursing facility complaining of altered mental status. She has been admitted for Lower urinary tract infectious disease [N39.0] Altered mental status, unspecified altered mental status type [R41.82] Sepsis due to gram-negative UTI (HCC) [A41.50, N39.0] Sepsis, due to unspecified organism, unspecified whether acute organ dysfunction present (HCC) [A41.9]  [facility]/TTS/Rt Permcath/? 81.3kg  End stage renal disease on hemodialysis - Continue TTS schedule - Will receive dialysis today, UF goal 0.5-1L as tolerated. - Next treatment scheduled for Saturday  2. Hypotension due to sepsis. Blood cultures and dialysis cath tip cultures with MRSA, Enterococcus (12/06/2022). Repeat cultures (12/07/2022) with no growth.   - Received pressor support during this admission.   - Continue daptomycin with dialysis.  Recommendations include Daptomycin 500mg  with dialysis until 01/03/23.   - Current regimen of midodrine and droxidopa  3. Anemia of chronic kidney disease : PRBC transfusion on 8/26, 9/3  - Awaiting labs to determine need for EPO   4. Secondary Hyperparathyroidism:  with hypophosphatemia.   -hold phosphate binders.   - Awaiting labs  5.  Hypokalemia  - continue to monitor with hemodialysis treatments.  LOS: 26 Alexandra Foster 9/19/202411:28 AM

## 2023-01-01 NOTE — Plan of Care (Signed)
Problem: Education: Goal: Knowledge of General Education information will improve Description: Including pain rating scale, medication(s)/side effects and non-pharmacologic comfort measures Outcome: Progressing   Problem: Nutrition: Goal: Adequate nutrition will be maintained Outcome: Progressing   Problem: Pain Managment: Goal: General experience of comfort will improve Outcome: Progressing   Problem: Safety: Goal: Ability to remain free from injury will improve Outcome: Progressing

## 2023-01-02 LAB — RENAL FUNCTION PANEL
Albumin: 2.6 g/dL — ABNORMAL LOW (ref 3.5–5.0)
Albumin: 2.5 g/dL — ABNORMAL LOW (ref 3.5–5.0)
Anion gap: 13 (ref 5–15)
Anion gap: 10 (ref 5–15)
BUN: 79 mg/dL — ABNORMAL HIGH (ref 8–23)
BUN: 39 mg/dL — ABNORMAL HIGH (ref 8–23)
CO2: 22 mmol/L (ref 22–32)
CO2: 27 mmol/L (ref 22–32)
Calcium: 9.4 mg/dL (ref 8.9–10.3)
Calcium: 8.7 mg/dL — ABNORMAL LOW (ref 8.9–10.3)
Chloride: 102 mmol/L (ref 98–111)
Chloride: 100 mmol/L (ref 98–111)
Creatinine, Ser: 3.03 mg/dL — ABNORMAL HIGH (ref 0.44–1.00)
Creatinine, Ser: 1.75 mg/dL — ABNORMAL HIGH (ref 0.44–1.00)
GFR, Estimated: 16 mL/min — ABNORMAL LOW (ref >60)
GFR, Estimated: 30 mL/min — ABNORMAL LOW (ref >60)
Glucose, Bld: 82 mg/dL (ref 70–99)
Glucose, Bld: 109 mg/dL — ABNORMAL HIGH (ref 70–99)
Phosphorus: 4.1 mg/dL (ref 2.5–4.6)
Phosphorus: 2.3 mg/dL — ABNORMAL LOW (ref 2.5–4.6)
Potassium: 3.9 mmol/L (ref 3.5–5.1)
Potassium: 3.8 mmol/L (ref 3.5–5.1)
Sodium: 137 mmol/L (ref 135–145)
Sodium: 137 mmol/L (ref 135–145)

## 2023-01-02 LAB — GLUCOSE, CAPILLARY
Glucose-Capillary: 101 mg/dL — ABNORMAL HIGH (ref 70–99)
Glucose-Capillary: 118 mg/dL — ABNORMAL HIGH (ref 70–99)
Glucose-Capillary: 119 mg/dL — ABNORMAL HIGH (ref 70–99)
Glucose-Capillary: 119 mg/dL — ABNORMAL HIGH (ref 70–99)
Glucose-Capillary: 125 mg/dL — ABNORMAL HIGH (ref 70–99)
Glucose-Capillary: 134 mg/dL — ABNORMAL HIGH (ref 70–99)

## 2023-01-02 MED ORDER — LANCET DEVICE MISC: 1.0000 | Freq: Three times a day (TID) | 0 refills | Status: DC

## 2023-01-02 MED ORDER — METOCLOPRAMIDE HCL 5 MG PO TABS
5.0000 mg | ORAL_TABLET | Freq: Three times a day (TID) | ORAL | Status: DC
  Administered 2023-01-02 – 2023-01-03 (×3): 5 mg via ORAL
  Filled 2023-01-02 (×3): qty 1

## 2023-01-02 MED ORDER — NEPRO/CARBSTEADY PO LIQD
1120.0000 mL | ORAL | Status: DC
  Administered 2023-01-02: 1120 mL

## 2023-01-02 MED ORDER — CLONAZEPAM 0.5 MG PO TABS: 0.5000 mg | ORAL_TABLET | Freq: Two times a day (BID) | ORAL | 0 refills | Status: DC | PRN

## 2023-01-02 MED ORDER — ESCITALOPRAM OXALATE 10 MG PO TABS: 10.0000 mg | ORAL_TABLET | Freq: Every day | ORAL | 0 refills | Status: DC

## 2023-01-02 MED ORDER — VITAMIN D3 25 MCG PO TABS: 2000.0000 [IU] | ORAL_TABLET | Freq: Every day | ORAL | 0 refills | Status: DC

## 2023-01-02 MED ORDER — METOCLOPRAMIDE HCL 5 MG PO TABS: 5.0000 mg | ORAL_TABLET | Freq: Three times a day (TID) | ORAL | 1 refills | Status: DC

## 2023-01-02 MED ORDER — BLOOD GLUCOSE TEST VI STRP: 1.0000 | ORAL_STRIP | Freq: Three times a day (TID) | 3 refills | Status: DC

## 2023-01-02 MED ORDER — NEPRO/CARBSTEADY PO LIQD: 1120.0000 mL | ORAL | Status: DC

## 2023-01-02 MED ORDER — BLOOD GLUCOSE MONITORING SUPPL DEVI: 1.0000 | Freq: Three times a day (TID) | 0 refills | Status: DC

## 2023-01-02 MED ORDER — LANCETS MISC. MISC: 1.0000 | Freq: Three times a day (TID) | 0 refills | Status: DC

## 2023-01-02 MED ORDER — NEPRO/CARBSTEADY PO LIQD: 1120.0000 mL | ORAL | 0 refills | Status: DC

## 2023-01-02 NOTE — Plan of Care (Signed)

## 2023-01-02 NOTE — Progress Notes (Signed)
[facility]  - [place] at [facility]   PATIENT NAME: Alexandra Foster    MR#:  [id number]  DATE OF BIRTH:  [birth year]  SUBJECTIVE:  no family at bedside during my evaluation.   So far patient has been tolerating low rate of tube feeding will increase to 65 mL per hour tonight. Discussed with daughter that she will be sitting out in the recliner today. Will switch to dialysis tomorrow in the dialysis chair and hoping she should be able to discharge tomorrow. VITALS:  Blood pressure (!) 106/44, pulse 73, temperature 98.4 F (36.9 C), temperature source Oral, resp. rate 17, height 5\' 4"  (1.626 m), weight 89 kg, SpO2 96%.  PHYSICAL EXAMINATION:   GENERAL:  72 y.o.-year-old patient with no acute distress. Morbidly obese bedbound LUNGS: Normal breath sounds bilaterall CARDIOVASCULAR: S1, S2 normal. No murmur   ABDOMEN: Soft PEG+ EXTREMITIES: left BKA NEUROLOGIC: nonfocal  patient is alert and awake SKIN:  Pressure Injury 12/06/22 Buttocks Left Stage 2 -  Partial thickness loss of dermis presenting as a shallow open injury with a red, pink wound bed without slough. (Active)  12/06/22 1800  Location: Buttocks  Location Orientation: Left  Staging: Stage 2 -  Partial thickness loss of dermis presenting as a shallow open injury with a red, pink wound bed without slough.  Wound Description (Comments):   Present on Admission: Yes     Pressure Injury 12/06/22 Buttocks Right Stage 2 -  Partial thickness loss of dermis presenting as a shallow open injury with a red, pink wound bed without slough. (Active)  12/06/22 1800  Location: Buttocks  Location Orientation: Right  Staging: Stage 2 -  Partial thickness loss of dermis presenting as a shallow open injury with a red, pink wound bed without slough.  Wound Description (Comments):   Present on Admission: Yes      LABORATORY PANEL:  CBC Recent Labs  Lab 01/01/23 1138  WBC 17.0*  HGB 10.4*  HCT 33.8*  PLT 661*     Chemistries  Recent Labs  Lab 01/02/23 0501  NA 137  K 3.8  CL 100  CO2 27  GLUCOSE 109*  BUN 39*  CREATININE 1.75*  CALCIUM 8.7*    Assessment and Plan Alexandra Foster is a 72 y.o. female with past medical history significant for asthma, COPD, type diabetes mellitus, s/p G-tube, GERD, ESRD on HD, hypertension, osteoarthritis, paroxysmal atrial fibrillation and OSA on CPAP, who presented to [facility] ED on 12/05/22 from [place] due to altered mental status with lethargy and decreased responsiveness.   Vomiting No vomintign today--will resume TF at 35 cc/ hr today and if tolerates well then resume regualr rate   Septic shock (HCC) Present on admission.  Initially required pressors.  Currently on midodrine and Florinef.  Patient also on droxidopa will taper down to 100 mg 3 times daily.  Pharmacy to try to obtain prior authorization for this medication.  Continue daptomycin through 9/21.  Blood cultures + for MRSA & Staph Epi on admission  MRSA bacteremia --MRSA bacteremia, Staph epidermidis bacteremia, Enterococcus faecalis UTI, aspiration pneumonia bilateral lower lobe.  Patient on daptomycin through 921 with dialysis.   Acute metabolic encephalopathy --Patient answer simple questions.   Paroxysmal atrial fibrillation (HCC) -Continue amiodarone.   Type 2 diabetes mellitus with chronic kidney disease, with long-term current use of insulin (HCC) Last hemoglobin A1c 5.2.   ESRD on hemodialysis (HCC) Continue dialysis as per nephrology   Diarrhea Likely secondary to tube feeds.  Cholestyramine 3 times a day.  Patient already on Imodium as needed and as needed Lomotil.   Patient is having BM.   Dysphagia Severe dysphagia secondary to prolonged trach.  Has been on tube feeding since then.     Anemia of chronic disease Last 74 after dialysis session today   Pressure injury of skin Bilateral buttocks stage II, present on admission.  See full description  below.   Obesity (BMI 30-39.9) BMI 33.47   Anxiety and depression Continue Klonopin BuSpar and Lexapro.   Patient currently sitting in the recliner chair. She said in the recliner chair per PT last week also. Will attempt to do dialysis tomorrow in the chair and planning to discharge thereafter to home. This was discussed with patient's daughter Alexandra Foster on the phone. TOC also has discussed discharge plan with the daughter.   Family communication :dter Alexandra Foster CODE STATUS: full DVT Prophylaxis :Heparin Level of care: Med-Surg Status is: Inpatient Remains inpatient appropriate because: patient's daughter wants patient to dialysis tomorrow in the chair. Will discharge after that if remains stable.  TOTAL TIME TAKING CARE OF THIS PATIENT: 35 minutes.  >50% time spent on counselling and coordination of care  Note: This dictation was prepared with Dragon dictation along with smaller phrase technology. Any transcriptional errors that result from this process are unintentional.  Enedina Finner M.D    [facility]   CC: Primary care physician; Shackleford, Lovell Sheehan, MD

## 2023-01-02 NOTE — Progress Notes (Signed)
[facility]  ROUNDING NOTE   Subjective:   Patient seen resting in bed No family present  Denies pain or discomfort  Informed she will be placed in chair later this morning   Objective:  Vital signs in last 24 hours:  Temp:  [98 F (36.7 C)-98.9 F (37.2 C)] 98.4 F (36.9 C) (09/20 0804) Pulse Rate:  [57-75] 73 (09/20 0804) Resp:  [14-31] 17 (09/20 0804) BP: (76-120)/(34-76) 106/44 (09/20 0804) SpO2:  [94 %-100 %] 96 % (09/20 0804)  Weight change:  Filed Weights   12/29/22 0707 01/01/23 0819  Weight: 88.5 kg 89 kg    Intake/Output: No intake/output data recorded.   Intake/Output this shift:  No intake/output data recorded.  Physical Exam: General: Chronically Ill appearing  Head: Normocephalic, atraumatic. Dry oral mucosal membranes  Eyes: Anicteric  Lungs:  Diminished bilaterally   Heart: regular   Abdomen:  Soft, nontender  Extremities: 1+ peripheral edema.  Neurologic: Alert  Access:  LT permcath placed on 12/19/22    Basic Metabolic Panel: Recent Labs  Lab 12/30/22 1034 12/31/22 0250 01/01/23 0500 01/01/23 1324 01/02/23 0501  NA 135 136 137 137 137  K 3.2* 4.1 3.7 3.9 3.8  CL 98 100 104 102 100  CO2 22 27 24 22 27   GLUCOSE 158* 131* 99 82 109*  BUN 28* 58* 81* 79* 39*  CREATININE 1.42* 2.49* 2.92* 3.03* 1.75*  CALCIUM 9.1 9.7 9.5 9.4 8.7*  PHOS 2.2* 2.8 4.1 4.1 2.3*    Liver Function Tests: Recent Labs  Lab 12/30/22 1034 12/31/22 0250 01/01/23 0500 01/01/23 1324 01/02/23 0501  ALBUMIN 2.8* 2.8* 2.5* 2.6* 2.5*   No results for input(s): "LIPASE", "AMYLASE" in the last 168 hours. No results for input(s): "AMMONIA" in the last 168 hours.  CBC: Recent Labs  Lab 12/27/22 1915 12/28/22 0555 12/30/22 0405 12/30/22 1034 01/01/23 1138  WBC 13.6*  --  12.6* 23.5* 17.0*  HGB 10.2* 10.2* 10.8* 12.5 10.4*  HCT 33.0*  --  36.9 41.4 33.8*  MCV 88.9  --  90.0 89.4 88.3  PLT 485*  --  678* 657* 661*    Cardiac Enzymes: No  results for input(s): "CKTOTAL", "CKMB", "CKMBINDEX", "TROPONINI" in the last 168 hours.   BNP: Invalid input(s): "POCBNP"  CBG: Recent Labs  Lab 01/01/23 1843 01/01/23 2019 01/01/23 2353 01/02/23 0350 01/02/23 0846  GLUCAP 89 99 108* 118* 119*    Microbiology: Results for orders placed or performed during the hospital encounter of 12/05/22  Culture, blood (Routine x 2)     Status: Abnormal   Collection Time: 12/05/22  8:56 PM   Specimen: BLOOD  Result Value Ref Range Status   Specimen Description   Final    BLOOD BLOOD RIGHT ARM Performed at [facility], [address] [place], [place] [address]    Special Requests   Final    BOTTLES DRAWN AEROBIC AND ANAEROBIC Blood Culture adequate volume Performed at [facility], [address] [place], [place] [address]    Culture  Setup Time   Final    GRAM POSITIVE COCCI IN BOTH AEROBIC AND ANAEROBIC BOTTLES CRITICAL RESULT CALLED TO, READ BACK BY AND VERIFIED WITH: SHEEMA HALLLAJI AT 1206 12/06/22.PMF Performed at [facility], [address] [place], [place] [address]    Culture (A)  Final    STAPHYLOCOCCUS AUREUS SUSCEPTIBILITIES PERFORMED ON PREVIOUS CULTURE WITHIN THE LAST 5 DAYS. Performed at [facility], [address], [place], [place]  [address]    Report Status 12/08/2022 FINAL  Final  Culture, blood (Routine x 2)     Status: Abnormal   Collection Time: 12/05/22  8:57 PM   Specimen: BLOOD  Result Value Ref Range Status   Specimen Description   Final    BLOOD BLOOD LEFT ARM Performed at [facility], [address] [place], [place] [address]    Special Requests   Final    BOTTLES DRAWN AEROBIC AND ANAEROBIC Blood Culture adequate volume Performed at [facility], [address] [place], [place] [address]    Culture  Setup Time   Final    GRAM POSITIVE COCCI IN BOTH AEROBIC AND ANAEROBIC BOTTLES CRITICAL RESULT CALLED TO, READ BACK BY AND VERIFIED  WITH: SHEEMA HALLAJI AT 1206 12/06/22.PMF Performed at [facility], [address] [place], [place] [address]    Culture METHICILLIN RESISTANT STAPHYLOCOCCUS AUREUS (A)  Final   Report Status 12/08/2022 FINAL  Final   Organism ID, Bacteria METHICILLIN RESISTANT STAPHYLOCOCCUS AUREUS  Final      Susceptibility   Methicillin resistant staphylococcus aureus - MIC*    CIPROFLOXACIN >=8 RESISTANT Resistant     ERYTHROMYCIN >=8 RESISTANT Resistant     GENTAMICIN <=0.5 SENSITIVE Sensitive     OXACILLIN >=4 RESISTANT Resistant     TETRACYCLINE <=1 SENSITIVE Sensitive     VANCOMYCIN <=0.5 SENSITIVE Sensitive     TRIMETH/SULFA >=320 RESISTANT Resistant     CLINDAMYCIN <=0.25 SENSITIVE Sensitive     RIFAMPIN <=0.5 SENSITIVE Sensitive     Inducible Clindamycin NEGATIVE Sensitive     LINEZOLID 2 SENSITIVE Sensitive     * METHICILLIN RESISTANT STAPHYLOCOCCUS AUREUS  Blood Culture ID Panel (Reflexed)     Status: Abnormal   Collection Time: 12/05/22  8:57 PM  Result Value Ref Range Status   Enterococcus faecalis NOT DETECTED NOT DETECTED Final   Enterococcus Faecium NOT DETECTED NOT DETECTED Final   Listeria monocytogenes NOT DETECTED NOT DETECTED Final   Staphylococcus species DETECTED (A) NOT DETECTED Final    Comment: CRITICAL RESULT CALLED TO, READ BACK BY AND VERIFIED WITH: SHEEMA HALLAJI AT 1206 12/06/22.PMF    Staphylococcus aureus (BCID) DETECTED (A) NOT DETECTED Final    Comment: Methicillin (oxacillin)-resistant Staphylococcus aureus (MRSA). MRSA is predictably resistant to beta-lactam antibiotics (except ceftaroline). Preferred therapy is vancomycin unless clinically contraindicated. Patient requires contact precautions if  hospitalized. CRITICAL RESULT CALLED TO, READ BACK BY AND VERIFIED WITH: SHEEMA HALLAJI AT 1206 12/06/22.PMF    Staphylococcus epidermidis DETECTED (A) NOT DETECTED Final    Comment: CRITICAL RESULT CALLED TO, READ BACK BY AND VERIFIED WITH: SHEEMA HALLAJI AT  1206 12/06/22.PMF    Staphylococcus lugdunensis NOT DETECTED NOT DETECTED Final   Streptococcus species NOT DETECTED NOT DETECTED Final   Streptococcus agalactiae NOT DETECTED NOT DETECTED Final   Streptococcus pneumoniae NOT DETECTED NOT DETECTED Final   Streptococcus pyogenes NOT DETECTED NOT DETECTED Final   A.calcoaceticus-baumannii NOT DETECTED NOT DETECTED Final   Bacteroides fragilis NOT DETECTED NOT DETECTED Final   Enterobacterales NOT DETECTED NOT DETECTED Final   Enterobacter cloacae complex NOT DETECTED NOT DETECTED Final   Escherichia coli NOT DETECTED NOT DETECTED Final   Klebsiella aerogenes NOT DETECTED NOT DETECTED Final   Klebsiella oxytoca NOT DETECTED NOT DETECTED Final   Klebsiella pneumoniae NOT DETECTED NOT DETECTED Final   Proteus species NOT DETECTED NOT DETECTED Final   Salmonella species NOT DETECTED NOT DETECTED Final   Serratia marcescens NOT DETECTED NOT  DETECTED Final   Haemophilus influenzae NOT DETECTED NOT DETECTED Final   Neisseria meningitidis NOT DETECTED NOT DETECTED Final   Pseudomonas aeruginosa NOT DETECTED NOT DETECTED Final   Stenotrophomonas maltophilia NOT DETECTED NOT DETECTED Final   Candida albicans NOT DETECTED NOT DETECTED Final   Candida auris NOT DETECTED NOT DETECTED Final   Candida glabrata NOT DETECTED NOT DETECTED Final   Candida krusei NOT DETECTED NOT DETECTED Final   Candida parapsilosis NOT DETECTED NOT DETECTED Final   Candida tropicalis NOT DETECTED NOT DETECTED Final   Cryptococcus neoformans/gattii NOT DETECTED NOT DETECTED Final   Methicillin resistance mecA/C DETECTED (A) NOT DETECTED Final    Comment: CRITICAL RESULT CALLED TO, READ BACK BY AND VERIFIED WITH: SHEEMA HALLAJI AT 1206 12/06/22.PMF    Meth resistant mecA/C and MREJ DETECTED (A) NOT DETECTED Final    Comment: CRITICAL RESULT CALLED TO, READ BACK BY AND VERIFIED WITH: SHEEMA HALLAJI AT 1206 12/06/22.PMF Performed at [facility], [address] [place], [place] [address]   Resp panel by RT-PCR (RSV, Flu A&B, Covid) Anterior Nasal Swab     Status: None   Collection Time: 12/05/22  9:53 PM   Specimen: Anterior Nasal Swab  Result Value Ref Range Status   SARS Coronavirus 2 by RT PCR NEGATIVE NEGATIVE Final    Comment: (NOTE) SARS-CoV-2 target nucleic acids are NOT DETECTED.  The SARS-CoV-2 RNA is generally detectable in upper respiratory specimens during the acute phase of infection. The lowest concentration of SARS-CoV-2 viral copies this assay can detect is 138 copies/mL. A negative result does not preclude SARS-Cov-2 infection and should not be used as the sole basis for treatment or other patient management decisions. A negative result may occur with  improper specimen collection/handling, submission of specimen other than nasopharyngeal swab, presence of viral mutation(s) within the areas targeted by this assay, and inadequate number of viral copies(<138 copies/mL). A negative result must be combined with clinical observations, patient history, and epidemiological information. The expected result is Negative.  Fact Sheet for Patients:  BloggerCourse.com  Fact Sheet for Healthcare Providers:  SeriousBroker.it  This test is no t yet approved or cleared by the [place] FDA and  has been authorized for detection and/or diagnosis of SARS-CoV-2 by FDA under an Emergency Use Authorization (EUA). This EUA will remain  in effect (meaning this test can be used) for the duration of the COVID-19 declaration under Section 564(b)(1) of the Act, 21 U.S.C.section 360bbb-3(b)(1), unless the authorization is terminated  or revoked sooner.       Influenza A by PCR NEGATIVE NEGATIVE Final   Influenza B by PCR NEGATIVE NEGATIVE Final    Comment: (NOTE) The Xpert Xpress SARS-CoV-2/FLU/RSV plus assay is intended as an aid in the diagnosis of influenza from Nasopharyngeal swab  specimens and should not be used as a sole basis for treatment. Nasal washings and aspirates are unacceptable for Xpert Xpress SARS-CoV-2/FLU/RSV testing.  Fact Sheet for Patients: BloggerCourse.com  Fact Sheet for Healthcare Providers: SeriousBroker.it  This test is not yet approved or cleared by the [place] FDA and has been authorized for detection and/or diagnosis of SARS-CoV-2 by FDA under an Emergency Use Authorization (EUA). This EUA will remain in effect (meaning this test can be used) for the duration of the COVID-19 declaration under Section 564(b)(1) of the Act, 21 U.S.C. section 360bbb-3(b)(1), unless the authorization is terminated or revoked.     Resp Syncytial Virus by PCR NEGATIVE NEGATIVE Final    Comment: (  NOTE) Fact Sheet for Patients: BloggerCourse.com  Fact Sheet for Healthcare Providers: SeriousBroker.it  This test is not yet approved or cleared by the [place] FDA and has been authorized for detection and/or diagnosis of SARS-CoV-2 by FDA under an Emergency Use Authorization (EUA). This EUA will remain in effect (meaning this test can be used) for the duration of the COVID-19 declaration under Section 564(b)(1) of the Act, 21 U.S.C. section 360bbb-3(b)(1), unless the authorization is terminated or revoked.  Performed at [facility], [address] [place], [place] [address]   MRSA Next Gen by PCR, Nasal     Status: Abnormal   Collection Time: 12/06/22  4:27 AM   Specimen: Nasal Mucosa; Nasal Swab  Result Value Ref Range Status   MRSA by PCR Next Gen DETECTED (A) NOT DETECTED Final    Comment: RESULT CALLED TO, READ BACK BY AND VERIFIED WITH: KRISTINE CHAMBERS AT 7829 12/06/22.PMF (NOTE) The GeneXpert MRSA Assay (FDA approved for NASAL specimens only), is one component of a comprehensive MRSA colonization surveillance program. It  is not intended to diagnose MRSA infection nor to guide or monitor treatment for MRSA infections. Test performance is not FDA approved in patients less than 81 years old. Performed at [facility], [address], [place], [place] [address]   Cath Tip Culture     Status: Abnormal   Collection Time: 12/06/22  2:22 PM   Specimen: Catheter Tip; Other  Result Value Ref Range Status   Specimen Description   Final    CATH TIP Performed at [facility], [address] [place], [place] [address]    Special Requests   Final    NONE Performed at [facility], [address] [place], [place] [address]    Culture (A)  Final    >=100,000 COLONIES/mL METHICILLIN RESISTANT STAPHYLOCOCCUS AUREUS   Report Status 12/08/2022 FINAL  Final   Organism ID, Bacteria METHICILLIN RESISTANT STAPHYLOCOCCUS AUREUS (A)  Final      Susceptibility   Methicillin resistant staphylococcus aureus - MIC*    CIPROFLOXACIN >=8 RESISTANT Resistant     ERYTHROMYCIN >=8 RESISTANT Resistant     GENTAMICIN <=0.5 SENSITIVE Sensitive     OXACILLIN >=4 RESISTANT Resistant     TETRACYCLINE <=1 SENSITIVE Sensitive     VANCOMYCIN <=0.5 SENSITIVE Sensitive     TRIMETH/SULFA >=320 RESISTANT Resistant     CLINDAMYCIN <=0.25 SENSITIVE Sensitive     RIFAMPIN <=0.5 SENSITIVE Sensitive     Inducible Clindamycin NEGATIVE Sensitive     LINEZOLID 2 SENSITIVE Sensitive     * >=100,000 COLONIES/mL METHICILLIN RESISTANT STAPHYLOCOCCUS AUREUS  Urine Culture (for pregnant, neutropenic or urologic patients or patients with an indwelling urinary catheter)     Status: Abnormal   Collection Time: 12/06/22  5:41 PM   Specimen: Urine, Clean Catch  Result Value Ref Range Status   Specimen Description   Final    URINE, CLEAN CATCH Performed at [facility], [address] [place], [place] [address]    Special Requests   Final    Normal Performed at [facility], [address]  [place], [place] [address]    Culture (A)  Final    70,000 COLONIES/mL ENTEROCOCCUS FAECALIS 70,000 COLONIES/mL YEAST    Report Status 12/09/2022 FINAL  Final   Organism ID, Bacteria ENTEROCOCCUS FAECALIS (A)  Final      Susceptibility   Enterococcus faecalis - MIC*    AMPICILLIN <=2 SENSITIVE Sensitive  NITROFURANTOIN <=16 SENSITIVE Sensitive     VANCOMYCIN 1 SENSITIVE Sensitive     * 70,000 COLONIES/mL ENTEROCOCCUS FAECALIS  Culture, blood (Routine X 2) w Reflex to ID Panel     Status: None   Collection Time: 12/07/22  1:31 PM   Specimen: BLOOD  Result Value Ref Range Status   Specimen Description BLOOD BLOOD RIGHT HAND  Final   Special Requests   Final    BOTTLES DRAWN AEROBIC ONLY Blood Culture adequate volume   Culture   Final    NO GROWTH 5 DAYS Performed at [facility], [address] [place], [place] [address]    Report Status 12/12/2022 FINAL  Final  Culture, blood (Routine X 2) w Reflex to ID Panel     Status: None   Collection Time: 12/07/22  6:41 PM   Specimen: BLOOD  Result Value Ref Range Status   Specimen Description BLOOD BLOOD RIGHT HAND  Final   Special Requests   Final    BOTTLES DRAWN AEROBIC AND ANAEROBIC Blood Culture results may not be optimal due to an inadequate volume of blood received in culture bottles   Culture   Final    NO GROWTH 5 DAYS Performed at [facility], [address], [place], [place] [address]    Report Status 12/12/2022 FINAL  Final  C Difficile Quick Screen (NO PCR Reflex)     Status: None   Collection Time: 12/12/22  4:07 PM   Specimen: STOOL  Result Value Ref Range Status   C Diff antigen NEGATIVE NEGATIVE Final   C Diff toxin NEGATIVE NEGATIVE Final   C Diff interpretation No C. difficile detected.  Final    Comment: Performed at [facility], [address] [place], [place] [address]  Gastrointestinal Panel by PCR , Stool     Status: None   Collection Time: 12/13/22  8:38 AM   Specimen:  Stool  Result Value Ref Range Status   Campylobacter species NOT DETECTED NOT DETECTED Final   Plesimonas shigelloides NOT DETECTED NOT DETECTED Final   Salmonella species NOT DETECTED NOT DETECTED Final   Yersinia enterocolitica NOT DETECTED NOT DETECTED Final   Vibrio species NOT DETECTED NOT DETECTED Final   Vibrio cholerae NOT DETECTED NOT DETECTED Final   Enteroaggregative E coli (EAEC) NOT DETECTED NOT DETECTED Final   Enteropathogenic E coli (EPEC) NOT DETECTED NOT DETECTED Final   Enterotoxigenic E coli (ETEC) NOT DETECTED NOT DETECTED Final   Shiga like toxin producing E coli (STEC) NOT DETECTED NOT DETECTED Final   Shigella/Enteroinvasive E coli (EIEC) NOT DETECTED NOT DETECTED Final   Cryptosporidium NOT DETECTED NOT DETECTED Final   Cyclospora cayetanensis NOT DETECTED NOT DETECTED Final   Entamoeba histolytica NOT DETECTED NOT DETECTED Final   Giardia lamblia NOT DETECTED NOT DETECTED Final   Adenovirus F40/41 NOT DETECTED NOT DETECTED Final   Astrovirus NOT DETECTED NOT DETECTED Final   Norovirus GI/GII NOT DETECTED NOT DETECTED Final   Rotavirus A NOT DETECTED NOT DETECTED Final   Sapovirus (I, II, IV, and V) NOT DETECTED NOT DETECTED Final    Comment: Performed at [facility], [address] [place], [place] [address]    Coagulation Studies: No results for input(s): "LABPROT", "INR" in the last 72 hours.   Urinalysis: No results for input(s): "COLORURINE", "LABSPEC", "PHURINE", "GLUCOSEU", "HGBUR", "BILIRUBINUR", "KETONESUR", "PROTEINUR", "UROBILINOGEN", "NITRITE", "LEUKOCYTESUR" in the last 72 hours.  Invalid input(s): "APPERANCEUR"     Imaging: DG Abd 1 View  Result Date: 12/31/2022 CLINICAL DATA:  Nausea and vomiting EXAM: ABDOMEN - 1 VIEW COMPARISON:  12/25/2022 FINDINGS: Similar gaseous distension of the colon. Overall nonobstructive bowel gas pattern. IVC filter. No gross free intraperitoneal air on supine view. IMPRESSION: Nonobstructive bowel  gas pattern. Electronically Signed   By: Duanne Guess D.O.   On: 12/31/2022 18:08     Medications:    sodium chloride Stopped (12/15/22 0035)   sodium chloride Stopped (12/08/22 1823)   anticoagulant sodium citrate     DAPTOmycin (CUBICIN) 500 mg in sodium chloride 0.9 % IVPB 500 mg (01/01/23 1718)    sodium chloride   Intravenous Once   amiodarone  200 mg Per Tube BID   ascorbic acid  500 mg Per Tube BID   atorvastatin  40 mg Per Tube Daily   chlorhexidine  15 mL Mouth Rinse BID   Chlorhexidine Gluconate Cloth  6 each Topical Q0600   vitamin D3  2,000 Units Per Tube Daily   cholestyramine  4 g Per Tube TID   copper  2 mg Per Tube Daily   droxidopa  100 mg Per Tube TID WC   epoetin (EPOGEN/PROCRIT) injection  10,000 Units Intravenous Q T,Th,Sa-HD   escitalopram  10 mg Per Tube Daily   famotidine  10 mg Per Tube Daily   feeding supplement (NEPRO CARB STEADY)  1,120 mL Per Tube Q24H   fludrocortisone  0.2 mg Per Tube Daily   free water  50 mL Per Tube Q4H   heparin injection (subcutaneous)  5,000 Units Subcutaneous Q8H   hydrocortisone cream   Topical TID   hydrOXYzine  100 mg Per Tube BID   insulin aspart  0-6 Units Subcutaneous Q4H   midodrine  20 mg Per Tube TID WC   multivitamin  1 tablet Per Tube QHS   mupirocin ointment   Nasal BID   nutrition supplement (JUVEN)  1 packet Per Tube BID BM   nystatin   Topical TID   phenylephrine  10 mg Per J Tube BID   sodium chloride, acetaminophen **OR** acetaminophen, alteplase, anticoagulant sodium citrate, artificial tears, clonazePAM, diphenhydrAMINE, diphenoxylate-atropine, fentaNYL (SUBLIMAZE) injection, heparin, ipratropium-albuterol, lidocaine (PF), lidocaine-prilocaine, loperamide HCl, magnesium hydroxide, ondansetron **OR** ondansetron (ZOFRAN) IV, pentafluoroprop-tetrafluoroeth, traZODone  Assessment/ Plan:  [name] is a 72 y.o.  female with past medical history of diabetes, COPD, GERD, hypertension. OA, a  fib, and end stage renal disease on hemodialysis. Patient presents to the ED from her nursing facility complaining of altered mental status. She has been admitted for Lower urinary tract infectious disease [N39.0] Altered mental status, unspecified altered mental status type [R41.82] Sepsis due to gram-negative UTI (HCC) [A41.50, N39.0] Sepsis, due to unspecified organism, unspecified whether acute organ dysfunction present (HCC) [A41.9]  [facility] [place]/TTS/Rt Permcath/? 81.3kg  End stage renal disease on hemodialysis - Continue TTS schedule - Next treatment scheduled for Saturday - Patient will be placed in chair later today for minimum of four hours.  - Renal navigator will ensure hoyer pad for outpatient clinic transfers.   2. Hypotension due to sepsis. Blood cultures and dialysis cath tip cultures with MRSA, Enterococcus (12/06/2022). Repeat cultures (12/07/2022) with no growth.   - Received pressor support during this admission.   - Continue daptomycin with dialysis.  Recommendations include Daptomycin 500mg  with dialysis until 01/03/23.   - Current regimen of midodrine and droxidopa, blood pressure stable  3. Anemia of chronic kidney disease : PRBC transfusion on 8/26, 9/3  - Continue EPO  with dialysis   4. Secondary Hyperparathyroidism:  with hypophosphatemia.   -hold phosphate binders.   - Bone minerals at goal  5.  Hypokalemia  - Corrected     LOS: 27 Jackson Fetters 9/20/202411:33 AM

## 2023-01-02 NOTE — TOC Progression Note (Signed)
Transition of Care [facility]) - Progression Note    Patient Details  Name: Alexandra Foster MRN: [id number] Date of Birth: 1950-09-01  Transition of Care [facility]) CM/SW Contact  Marlowe Sax, RN Phone Number: 01/02/2023, 12:21 PM  Clinical Narrative:    Spoke with the patient's daughter  She prefers to wait until tomorrow to DC so the patient can sit in the chair and so that she has the opportunity to be taught how and when to do the blood sugars She stated that she needs Scripts for all medications and a glucometer due to the fact that the patient has been in a facility for 2 years She is aware that Dialysis will take place first thing in the morning and then she will dc home with EMS transport right afterwards She is requesting to be taught how to do Blood sugar checks and when to do them, the bedside nurse will instruct her on this [employer] will deliver feeding for tube feeding in the morning for tomorrow evening dosage  She asked about getting a bigger wheelchair, I did explain that with Insurance they will not cover the bariatric WC unless the patient is over 300 Lbs.  She stated understanding  The Hospital Bed and other DME has been delivered to the home and is set up and ready Expected Discharge Plan: Home w Home Health Services Barriers to Discharge: Continued Medical Work up  Expected Discharge Plan and Services     Post Acute Care Choice: Home Health Living arrangements for the past 2 months: Skilled Nursing Facility                           HH Arranged: PT, OT, RN [facility] Agency: [facility] Date HH Agency Contacted: 12/24/22 Time HH Agency Contacted: 1310 Representative spoke with at [facility] Agency: Barbara Cower   Social Determinants of Health (SDOH) Interventions SDOH Screenings   Food Insecurity: Patient Unable To Answer (12/06/2022)  Housing: High Risk (12/06/2022)  Transportation Needs: No Transportation Needs (12/06/2022)  Utilities: Not At Risk  (12/06/2022)  Alcohol Screen: Low Risk  (02/11/2021)  Depression (PHQ2-9): Low Risk  (04/30/2021)  Financial Resource Strain: Medium Risk (08/09/2020)  Physical Activity: Inactive (08/09/2020)  Social Connections: Moderately Integrated (08/09/2020)  Stress: No Stress Concern Present (08/09/2020)  Tobacco Use: Medium Risk (12/05/2022)    Readmission Risk Interventions    12/26/2022   11:15 AM  Readmission Risk Prevention Plan  Transportation Screening Complete  Medication Review (RN Care Manager) Complete  PCP or Specialist appointment within 3-5 days of discharge Complete  HRI or Home Care Consult Complete  SW Recovery Care/Counseling Consult Complete  Palliative Care Screening Not Applicable  Skilled Nursing Facility Not Applicable

## 2023-01-02 NOTE — Progress Notes (Signed)
Physical Therapy Treatment Patient Details Name: Alexandra Foster MRN: [id number] DOB: 1950/06/14 Today's Date: 01/02/2023   History of Present Illness Pt. is a 72 y.o female with PMHx significant for ESRD on HD, L AKA admitted with Acute Metabolic Encephalopathy in setting of Severe Sepsis with Septic Shock due to MRSA Bacteremia from infected right chest Permcath, along with Enterococcus Faecalis UTI.    PT Comments  Pt resting in bed upon PT arrival; agreeable to therapy.  During session pt min to mod assist with logrolling to R in bed with bed rail use (x5 trials); pt declined to trial sitting on EOB.  Initiated bed level LE ex's but pt noted with bowel incontinence requiring clean-up (nurse present and notified) so deferred further activities (nurse present attending to pt's needs end of session).  PT POC reviewed and updated.    If plan is discharge home, recommend the following: A lot of help with bathing/dressing/bathroom;Assistance with cooking/housework;Assist for transportation;Help with stairs or ramp for entrance;Supervision due to cognitive status;Direct supervision/assist for financial management;Two people to help with walking and/or transfers   Can travel by private vehicle      No  Equipment Recommendations  Other (comment) (Per prior note, family/TOC report pt has all DME needs met)    Recommendations for Other Services       Precautions / Restrictions Precautions Precautions: Fall Precaution Comments: L IJ HD cath; PEG Restrictions Weight Bearing Restrictions: No     Mobility  Bed Mobility Overal bed mobility: Needs Assistance Bed Mobility: Rolling Rolling: Min assist, Mod assist         General bed mobility comments: logrolling to R x5 trials; vc's for technique; use of bed rail    Transfers                   General transfer comment: baseline dependent (hoyer) lift; pt declined to sit on EOB    Ambulation/Gait                    Stairs             Wheelchair Mobility     Tilt Bed    Modified Rankin (Stroke Patients Only)       Balance                                            Cognition Arousal: Alert Behavior During Therapy: Anxious Overall Cognitive Status: No family/caregiver present to determine baseline cognitive functioning (Oriented to at least person)                                          Exercises General Exercises - Lower Extremity Ankle Circles/Pumps: AROM, Strengthening, Right, 10 reps (HOB elevated) Heel Slides: AROM, Strengthening, Right, 10 reps, Supine (HOB elevated)    General Comments  Pt agreeable to PT session.      Pertinent Vitals/Pain Pain Assessment Pain Assessment: Faces Faces Pain Scale: Hurts a little bit Pain Location: pt's bottom Pain Descriptors / Indicators: Tender Pain Intervention(s): Limited activity within patient's tolerance, Monitored during session, Repositioned (RN present and aware)    Home Living  Prior Function            PT Goals (current goals can now be found in the care plan section) Acute Rehab PT Goals Patient Stated Goal: to go home PT Goal Formulation: With patient Time For Goal Achievement: 01/16/23 Potential to Achieve Goals: Fair Progress towards PT goals: Progressing toward goals (improved bed mobility)    Frequency    Min 1X/week      PT Plan      Co-evaluation              AM-PAC PT "6 Clicks" Mobility   Outcome Measure  Help needed turning from your back to your side while in a flat bed without using bedrails?: A Lot Help needed moving from lying on your back to sitting on the side of a flat bed without using bedrails?: Total Help needed moving to and from a bed to a chair (including a wheelchair)?: Total Help needed standing up from a chair using your arms (e.g., wheelchair or bedside chair)?: Total Help needed to walk in  hospital room?: Total Help needed climbing 3-5 steps with a railing? : Total 6 Click Score: 7    End of Session   Activity Tolerance: Patient tolerated treatment well Patient left: in bed;with call bell/phone within reach;with nursing/sitter in room (Nursing present providing pt care) Nurse Communication: Mobility status PT Visit Diagnosis: Muscle weakness (generalized) (M62.81);Difficulty in walking, not elsewhere classified (R26.2)     Time: 9604-5409 PT Time Calculation (min) (ACUTE ONLY): 19 min  Charges:    $Therapeutic Activity: 8-22 mins PT General Charges $$ ACUTE PT VISIT: 1 Visit                     Hendricks Limes, PT 01/02/23, 9:20 AM

## 2023-01-02 NOTE — Plan of Care (Signed)
Problem: Respiratory: Goal: Ability to maintain adequate ventilation will improve Outcome: Progressing   Problem: Pain Managment: Goal: General experience of comfort will improve Outcome: Progressing   Problem: Safety: Goal: Ability to remain free from injury will improve Outcome: Progressing

## 2023-01-02 NOTE — Care Management Important Message (Signed)
Important Message  Patient Details  Name: Casimera Gerads MRN: [id number] Date of Birth: 1950-12-04   Medicare Important Message Given:  Yes     Olegario Messier A Dallis Czaja 01/02/2023, 3:27 PM

## 2023-01-03 LAB — GLUCOSE, CAPILLARY
Glucose-Capillary: 115 mg/dL — ABNORMAL HIGH (ref 70–99)
Glucose-Capillary: 122 mg/dL — ABNORMAL HIGH (ref 70–99)
Glucose-Capillary: 116 mg/dL — ABNORMAL HIGH (ref 70–99)
Glucose-Capillary: 99 mg/dL (ref 70–99)

## 2023-01-03 LAB — CBC
HCT: 33.3 % — ABNORMAL LOW (ref 36.0–46.0)
Hemoglobin: 10.1 g/dL — ABNORMAL LOW (ref 12.0–15.0)
MCH: 26.9 pg (ref 26.0–34.0)
MCHC: 30.3 g/dL (ref 30.0–36.0)
MCV: 88.8 fL (ref 80.0–100.0)
Platelets: 611 10*3/uL — ABNORMAL HIGH (ref 150–400)
RBC: 3.75 MIL/uL — ABNORMAL LOW (ref 3.87–5.11)
RDW: 18.8 % — ABNORMAL HIGH (ref 11.5–15.5)
WBC: 14.6 10*3/uL — ABNORMAL HIGH (ref 4.0–10.5)
nRBC: 0.2 % (ref 0.0–0.2)

## 2023-01-03 LAB — RENAL FUNCTION PANEL
Albumin: 2.4 g/dL — ABNORMAL LOW (ref 3.5–5.0)
Anion gap: 8 (ref 5–15)
BUN: 71 mg/dL — ABNORMAL HIGH (ref 8–23)
CO2: 26 mmol/L (ref 22–32)
Calcium: 9 mg/dL (ref 8.9–10.3)
Chloride: 101 mmol/L (ref 98–111)
Creatinine, Ser: 2.48 mg/dL — ABNORMAL HIGH (ref 0.44–1.00)
GFR, Estimated: 20 mL/min — ABNORMAL LOW (ref >60)
Glucose, Bld: 95 mg/dL (ref 70–99)
Phosphorus: 4.2 mg/dL (ref 2.5–4.6)
Potassium: 3.3 mmol/L — ABNORMAL LOW (ref 3.5–5.1)
Sodium: 135 mmol/L (ref 135–145)

## 2023-01-03 MED ORDER — HEPARIN SODIUM (PORCINE) 1000 UNIT/ML DIALYSIS: 25.0000 [IU]/kg | INTRAMUSCULAR | Status: DC | PRN

## 2023-01-03 MED ORDER — TRAZODONE HCL 50 MG PO TABS: 25.0000 mg | ORAL_TABLET | Freq: Every evening | ORAL | 0 refills | Status: DC | PRN

## 2023-01-03 MED ORDER — HEPARIN SODIUM (PORCINE) 1000 UNIT/ML IJ SOLN
INTRAMUSCULAR | Status: AC
  Filled 2023-01-03: qty 10

## 2023-01-03 MED ORDER — EPOETIN ALFA 10000 UNIT/ML IJ SOLN
INTRAMUSCULAR | Status: AC
  Filled 2023-01-03: qty 1

## 2023-01-03 NOTE — Plan of Care (Signed)

## 2023-01-03 NOTE — Discharge Summary (Addendum)
Physician Discharge Summary   Patient: Alexandra Foster MRN: [id number] DOB: Jul 04, 1950  Admit date:     12/05/2022  Discharge date: 01/03/23  Discharge Physician: Enedina Finner   PCP: Patrecia Pour, MD   Recommendations at discharge:   follow-up PCP Dr. Carlynn Purl in 1 to 2 weeks resume your tube feeding as per instruction resume your outpatient dialysis on your schedule  Discharge Diagnoses: Principal Problem:   Vomiting Active Problems:   Septic shock (HCC)   MRSA bacteremia   Acute metabolic encephalopathy   Paroxysmal atrial fibrillation (HCC)   Type 2 diabetes mellitus with chronic kidney disease, with long-term current use of insulin (HCC)   ESRD on hemodialysis (HCC)   Sepsis (HCC)   Anxiety and depression   Obesity (BMI 30-39.9)   Pressure injury of skin   Anemia of chronic disease   Dysphagia   Diarrhea   Alexandra Foster is a 72 y.o. female with past medical history significant for asthma, COPD, type diabetes mellitus, s/p G-tube, GERD, ESRD on HD, hypertension, osteoarthritis, paroxysmal atrial fibrillation and OSA on CPAP, who presented to [facility] ED on 12/05/22 from [place] due to altered mental status with lethargy and decreased responsiveness.    Septic shock (HCC)--now improved Present on admission.  Initially required pressors.   --Currently on midodrine and Florinef.  -- Patient was also on droxidopa will taper down to 100 mg 3 times daily--now d/ced since BP holding up -- Completed daptomycin through 9/21.  --Blood cultures + for MRSA & Staph Epi on admission --repeat [facility] 8/30 No growth   MRSA bacteremia --MRSA bacteremia, Staph epidermidis bacteremia, Enterococcus faecalis UTI, aspiration pneumonia bilateral lower lobe. -- daptomycin through 9/21 with dialysis.   Acute metabolic encephalopathy --Patient answer simple questions. --near baseline   Paroxysmal atrial fibrillation (HCC) -Continue amiodarone.   Type 2 diabetes  mellitus with chronic kidney disease, with long-term current use of insulin (HCC) Last hemoglobin A1c 5.2.   ESRD on hemodialysis [facility]) Continue dialysis as per nephrology Pt was on Copper tabs thru the hospital stay. D/ced now. PCP or Nephrology to recheck if clinically indicated Ok from Nephrology standpoint for d/c   Diarrhea Likely secondary to tube feeds.  Cholestyramine 3 times a day.  Patient already on Imodium as needed and as needed Lomotil.   Patient is having BM. C diff neg   Dysphagia Severe dysphagia secondary to prolonged trach.  Has been on tube feeding since then.   Vomiting resolved. Cont TF at present rate   Anemia of chronic disease Last 10.1 after dialysis session today   Pressure injury of skin Bilateral buttocks stage II, present on admission.  See full description below. Dressing instructions given     Obesity (BMI 30-39.9) BMI 33.47   Anxiety and depression Continue Klonopin,Lexapro.   Pt sat in the HD chair today and tolerated it very well patient has multiple comorbidities. She is at high risk for readmission.   Family communication :none today. Dter aware of d/c plans CODE STATUS: full DVT Prophylaxis :Heparin     Pain control - [place] Controlled Substance Reporting System database was reviewed. and patient was instructed, not to drive, operate heavy machinery, perform activities at heights, swimming or participation in water activities or provide baby-sitting services while on Pain, Sleep and Anxiety Medications; until their outpatient Physician has advised to do so again. Also recommended to not to take more than prescribed Pain, Sleep and Anxiety Medications.  Consultants: ID, Nephrology Disposition: Home health  Diet recommendation:  Discharge Diet Orders (From admission, onward)     Start     Ordered   01/03/23 0000  Diet - low sodium heart healthy        01/03/23 1217           Renal diet DISCHARGE MEDICATION: Allergies  as of 01/03/2023   No Known Allergies      Medication List     STOP taking these medications    artificial tears Oint ophthalmic ointment Commonly known as: LACRILUBE   busPIRone 5 MG tablet Commonly known as: BUSPAR   collagenase 250 UNIT/GM ointment Commonly known as: SANTYL   dextrose 5 % SOLN 100 mL with copper chloride 0.4 MG/ML SOLN 2 mg   ipratropium-albuterol 0.5-2.5 (3) MG/3ML Soln Commonly known as: DUONEB   lactobacillus Pack   lip balm Oint   loperamide 2 MG capsule Commonly known as: IMODIUM Replaced by: loperamide HCl 1 MG/7.5ML suspension   omeprazole 20 MG capsule Commonly known as: PRILOSEC   pantoprazole sodium 40 mg Commonly known as: PROTONIX   sennosides 8.8 MG/5ML syrup Commonly known as: SENOKOT       TAKE these medications    acetaminophen 325 MG tablet Commonly known as: TYLENOL Place 2 tablets (650 mg total) into feeding tube every 6 (six) hours as needed for mild pain (or Fever >/= 101).   amiodarone 200 MG tablet Commonly known as: PACERONE Place 1 tablet (200 mg total) into feeding tube 2 (two) times daily.   ascorbic acid 500 MG tablet Commonly known as: VITAMIN C Place 1 tablet (500 mg total) into feeding tube 2 (two) times daily.   atorvastatin 40 MG tablet Commonly known as: LIPITOR Place 1 tablet (40 mg total) into feeding tube daily.   Blood Glucose Monitoring Suppl Devi 1 each by Does not apply route in the morning, at noon, and at bedtime. May substitute to any manufacturer covered by patient's insurance. Notes to patient: As directed   BLOOD GLUCOSE TEST STRIPS Strp 1 each by In Vitro route in the morning, at noon, and at bedtime. May substitute to any manufacturer covered by patient's insurance. Notes to patient: As directed   chlorhexidine gluconate (MEDLINE KIT) 0.12 % solution Commonly known as: PERIDEX 15 mLs by Mouth Rinse route 2 (two) times daily.   cholestyramine 4 g packet Commonly known as:  QUESTRAN Place 1 packet (4 g total) into feeding tube 3 (three) times daily.   clonazePAM 0.5 MG tablet Commonly known as: KLONOPIN Take 1 tablet (0.5 mg total) by mouth 2 (two) times daily as needed for anxiety. 0.25 mg every morning and 0.5 mg at bedtime for Agitation What changed:  how much to take when to take this reasons to take this   DAPTOmycin 500 mg in sodium chloride 0.9 % 50 mL Inject 500 mg into the vein Every Tuesday,Thursday,and Saturday with dialysis. Stop after 01/03/23 dose (given with dialysis) Notes to patient: As directed on HD days: T/TH/SAT schedule   diphenoxylate-atropine 2.5-0.025 MG tablet Commonly known as: LOMOTIL 1 tablet by Per J Tube route 4 (four) times daily as needed for diarrhea or loose stools.   escitalopram 10 MG tablet Commonly known as: LEXAPRO Place 1 tablet (10 mg total) into feeding tube daily.   famotidine 10 MG tablet Commonly known as: PEPCID Place 1 tablet (10 mg total) into feeding tube daily.   feeding supplement (NEPRO CARB STEADY) Liqd Place 1,120 mLs into feeding tube daily.   feeding supplement (  NEPRO CARB STEADY) Liqd Place 1,120 mLs into feeding tube daily. Notes to patient: Duplicate order   fludrocortisone 0.1 MG tablet Commonly known as: FLORINEF Place 2 tablets (0.2 mg total) into feeding tube daily.   free water Soln Place 50 mLs into feeding tube every 4 (four) hours.   hydrOXYzine 50 MG tablet Commonly known as: ATARAX Take 2 tablets (100 mg total) by mouth 2 (two) times daily.   Lancet Device Misc 1 each by Does not apply route in the morning, at noon, and at bedtime. May substitute to any manufacturer covered by patient's insurance. Notes to patient: As directed   Lancets Misc. Misc 1 each by Does not apply route in the morning, at noon, and at bedtime. May substitute to any manufacturer covered by patient's insurance. Notes to patient: As directed   loperamide HCl 1 MG/7.5ML suspension Commonly  known as: IMODIUM Place 30 mLs (4 mg total) into feeding tube every 8 (eight) hours as needed for diarrhea or loose stools. Replaces: loperamide 2 MG capsule   metoCLOPramide 5 MG tablet Commonly known as: REGLAN Take 1 tablet (5 mg total) by mouth 3 (three) times daily before meals.   midodrine 10 MG tablet Commonly known as: PROAMATINE Place 2 tablets (20 mg total) into feeding tube 3 (three) times daily with meals. What changed:  medication strength how much to take   multivitamin Tabs tablet Place 1 tablet into feeding tube at bedtime.   nystatin powder Commonly known as: MYCOSTATIN/NYSTOP Apply topically 3 (three) times daily.   polyvinyl alcohol 1.4 % ophthalmic solution Commonly known as: LIQUIFILM TEARS Place 1 drop into both eyes as needed for dry eyes.   traZODone 50 MG tablet Commonly known as: DESYREL Take 0.5 tablets (25 mg total) by mouth at bedtime as needed for sleep.   vitamin D3 25 MCG tablet Commonly known as: CHOLECALCIFEROL Place 2 tablets (2,000 Units total) into feeding tube daily.               Durable Medical Equipment  (From admission, onward)           Start     Ordered   12/29/22 1240  For home use only DME Tube feeding  Once       Comments: Pt requires tube feeding for sole source nutrition use. Anticipate long term need for enteral nutrition.   Home TF orders:  Nepro @70ml /hr x 16 hrs (from 1800-1000)   Free water flushes 50ml q4 hours to maintain tube patency    Regimen provides 2016kcal/day, 91g/day protein and 1114ml/day of free water.   12/29/22 1239   12/22/22 1610  For home use only DME Bedside commode  Once       Comments: Bariatric  Question:  Patient needs a bedside commode to treat with the following condition  Answer:  Impaired mobility   12/22/22 1609   12/22/22 1609  For home use only DME standard manual wheelchair with seat cushion  Once       Comments: Will need Bariatric Wheelchair, nPatient suffers from  asthma, COPD, type diabetes mellitus, s/p G-tube, GERD, ESRD on HD, hypertension, osteoarthritis, paroxysmal atrial fibrillation and OSA on CPAP,   which impairs their ability to perform daily activities like Adls in the home.  A walking aide will not resolve issue with performing activities of daily living. A wheelchair will allow patient to safely perform daily activities. Patient can safely propel the wheelchair in the home or has a caregiver who can  provide assistance. Length of need 12 months. Accessories: elevating leg rests (ELRs), wheel locks, extensions and anti-tippers.cushion   12/22/22 1609   12/22/22 1600  For home use only DME Hospital bed  Once       Question Answer Comment  Length of Need 12 Months   Patient has (list medical condition): asthma, COPD, type diabetes mellitus, s/p G-tube, GERD, ESRD on HD, hypertension, osteoarthritis, paroxysmal atrial fibrillation and OSA on CPAP, Obese, sacral decubitus   The above medical condition requires: Patient requires the ability to reposition frequently   Bed type Semi-electric   Hoyer Lift Yes   Support Surface: Alternating Pressure Pad and Pump      12/22/22 1603              Discharge Care Instructions  (From admission, onward)           Start     Ordered   01/03/23 0000  Discharge wound care:       Comments: 12/10/22 1000    Foam dressing  Every 3 days     Comments: Silicone foam dressings to the buttocks OR use barrier cream (no need to use both),change every 3 days. ASSESS UNDER dressings each shift for any acute changes in the wounds.  12/09/22 1206   01/03/23 1217            Follow-up Information     [name], MD Follow up in 5 day(s).   Specialty: Family Medicine Contact information: [address] [place] [address] [phone]                Discharge Exam: Ceasar Mons Weights   01/01/23 0819  Weight: 89 kg   72 y.o.-year-old patient with no acute distress. Morbidly  obese bedbound LUNGS: Normal breath sounds bilaterally CARDIOVASCULAR: S1, S2 normal. No murmur   ABDOMEN: Soft PEG+ EXTREMITIES: left BKA NEUROLOGIC: nonfocal  patient is awake, gen weakness SKIN:  Pressure Injury 12/06/22 Buttocks Left Stage 2 -  Partial thickness loss of dermis presenting as a shallow open injury with a red, pink wound bed without slough. (Active)  12/06/22 1800  Location: Buttocks  Location Orientation: Left  Staging: Stage 2 -  Partial thickness loss of dermis presenting as a shallow open injury with a red, pink wound bed without slough.  Wound Description (Comments):   Present on Admission: Yes     Pressure Injury 12/06/22 Buttocks Right Stage 2 -  Partial thickness loss of dermis presenting as a shallow open injury with a red, pink wound bed without slough. (Active)  12/06/22 1800  Location: Buttocks  Location Orientation: Right  Staging: Stage 2 -  Partial thickness loss of dermis presenting as a shallow open injury with a red, pink wound bed without slough.  Wound Description (Comments):   Present on Admission: Yes    Condition at discharge: fair  The results of significant diagnostics from this hospitalization (including imaging, microbiology, ancillary and laboratory) are listed below for reference.   Imaging Studies: DG Abd 1 View  Result Date: 12/31/2022 CLINICAL DATA:  Nausea and vomiting EXAM: ABDOMEN - 1 VIEW COMPARISON:  12/25/2022 FINDINGS: Similar gaseous distension of the colon. Overall nonobstructive bowel gas pattern. IVC filter. No gross free intraperitoneal air on supine view. IMPRESSION: Nonobstructive bowel gas pattern. Electronically Signed   By: Duanne Guess D.O.   On: 12/31/2022 18:08   DG ABD ACUTE 2+V W 1V CHEST  Result Date: 12/25/2022 CLINICAL DATA:  Nausea/vomiting EXAM: DG ABDOMEN  ACUTE WITH 1 VIEW CHEST COMPARISON:  Chest radiograph dated 12/12/2022 FINDINGS: Interval placement of a left IJ dual lumen dialysis catheter with  its distal tip in the upper right atrium. Mild left lower lobe atelectasis. Right lung is clear. No frank interstitial edema. No pleural effusion or pneumothorax. Cardiomegaly. Nonobstructive bowel gas pattern. IVC filter. IMPRESSION: Interval placement of a left IJ dual lumen dialysis catheter with its distal tip in the upper right atrium. Cardiomegaly.  Mild left lower lobe atelectasis. Nonobstructive bowel gas pattern. Electronically Signed   By: Charline Bills M.D.   On: 12/25/2022 21:11   PERIPHERAL VASCULAR CATHETERIZATION  Result Date: 12/19/2022 See surgical note for result.  US Venous Img Lower Bilateral (DVT)  Result Date: 12/17/2022 CLINICAL DATA:  Bilateral lower extremity pain and edema. History of IVC filter placement. Evaluate for DVT. History of left above knee amputation. EXAM: BILATERAL LOWER EXTREMITY VENOUS DOPPLER ULTRASOUND TECHNIQUE: Gray-scale sonography with graded compression, as well as color Doppler and duplex ultrasound were performed to evaluate the lower extremity deep venous systems from the level of the common femoral vein and including the common femoral, femoral, profunda femoral, popliteal and calf veins including the posterior tibial, peroneal and gastrocnemius veins when visible. The superficial great saphenous vein was also interrogated. Spectral Doppler was utilized to evaluate flow at rest and with distal augmentation maneuvers in the common femoral, femoral and popliteal veins. COMPARISON:  Left lower extremity venous Doppler ultrasound-05/04/2021 FINDINGS: RIGHT LOWER EXTREMITY Common Femoral Vein: No evidence of thrombus. Normal compressibility, respiratory phasicity and response to augmentation. Saphenofemoral Junction: No evidence of thrombus. Normal compressibility and flow on color Doppler imaging. Profunda Femoral Vein: No evidence of thrombus. Normal compressibility and flow on color Doppler imaging. Femoral Vein: No evidence of thrombus. Normal  compressibility, respiratory phasicity and response to augmentation. Popliteal Vein: No evidence of thrombus. Normal compressibility, respiratory phasicity and response to augmentation. Calf Veins: Appear patent where visualized. Superficial Great Saphenous Vein: No evidence of thrombus. Normal compressibility. Other Findings:  None. 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 Saphenofemoral Junction: There is nonocclusive wall thickening/chronic DVT involving the saphenofemoral junction (image 34). Profunda Femoral Vein: There is nonocclusive wall thickening/chronic DVT involving the left deep femoral vein (image 39). Femoral Vein: No evidence of acute or chronic thrombus. Normal compressibility, respiratory phasicity and response to augmentation. Popliteal Vein: N/A Calf Veins: N/A Superficial Great Saphenous Vein: No evidence of thrombus. Normal compressibility. Other Findings:  None. IMPRESSION: 1. No evidence of acute or chronic DVT within the right lower extremity. 2. 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 Electronically Signed   By: Simonne Come M.D.   On: 12/17/2022 13:34   DG Abd 1 View  Result Date: 12/12/2022 CLINICAL DATA:  Vomiting. EXAM: ABDOMEN - 1 VIEW COMPARISON:  12/09/2022 and CT 12/10/2022 FINDINGS: Right femoral dialysis catheter with the tip in the lower IVC region. Patient has an IVC filter above the dialysis catheter tip. Patient is rotated on these images. Nonobstructive bowel gas pattern in the abdomen and pelvis. Round high-density foci in the pelvis most compatible with calcifications and colonic diverticula based on previous CT. Patient has known right  renal calculi that are poorly characterized on this examination. Gastrostomy tube is present.  IMPRESSION: 1. Nonobstructive bowel gas pattern. 2. Right femoral dialysis catheter. 3. Gastrostomy tube. 4. Right renal calculi. Electronically Signed   By: Richarda Overlie M.D.   On: 12/12/2022 08:00   DG Chest Port 1 View  Result Date: 12/12/2022 CLINICAL DATA:  Altered mental status.  Vomiting. EXAM: PORTABLE CHEST 1 VIEW COMPARISON:  12/05/2022 and chest CT 12/02/2022 FINDINGS: Heart and mediastinum are within normal limits. Patchy interstitial densities in both lungs are nonspecific. No large areas airspace disease or consolidation. Negative for a pneumothorax. No acute bone abnormality. IMPRESSION: Patchy interstitial densities in both lungs. Findings are nonspecific and could represent atelectasis or edema. No large areas of airspace disease. Electronically Signed   By: Richarda Overlie M.D.   On: 12/12/2022 07:57   CT CHEST ABDOMEN PELVIS WO CONTRAST  Result Date: 12/10/2022 CLINICAL DATA:  Sepsis, bacteremia EXAM: CT CHEST, ABDOMEN AND PELVIS WITHOUT CONTRAST TECHNIQUE: Multidetector CT imaging of the chest, abdomen and pelvis was performed following the standard protocol without IV contrast. RADIATION DOSE REDUCTION: This exam was performed according to the departmental dose-optimization program which includes automated exposure control, adjustment of the mA and/or kV according to patient size and/or use of iterative reconstruction technique. COMPARISON:  CT abdomen pelvis, 06/11/2021 FINDINGS: CT CHEST FINDINGS Cardiovascular: Aortic atherosclerosis. Normal heart size. No pericardial effusion. Mediastinum/Nodes: No enlarged mediastinal, hilar, or axillary lymph nodes. Thyroid gland, trachea, and esophagus demonstrate no significant findings. Lungs/Pleura: Small bilateral pleural effusions and associated atelectasis or consolidation. Musculoskeletal: No chest wall abnormality. No acute osseous findings. CT ABDOMEN  PELVIS FINDINGS Hepatobiliary: Coarse, nodular cirrhotic morphology of the liver. Rim calcified gallstones. No gallbladder wall thickening or biliary ductal dilatation. Pancreas: Unremarkable. No pancreatic ductal dilatation or surrounding inflammatory changes. Spleen: Normal in size without significant abnormality. Adrenals/Urinary Tract: Adrenal glands are unremarkable. Multiple nonobstructive right renal calculi. No ureteral calculi or hydronephrosis. Bladder is unremarkable. Stomach/Bowel: Percutaneous gastrostomy, tip and balloon within the distal gastric body. Appendix appears normal. No evidence of bowel wall thickening, distention, or inflammatory changes. Sigmoid diverticulosis. Rectal tube. Vascular/Lymphatic: Aortic atherosclerosis. Infrarenal IVC filter. No enlarged abdominal or pelvic lymph nodes. Reproductive: Status post hysterectomy. Other: No abdominal wall hernia or abnormality. No ascites. Musculoskeletal: No acute osseous findings. IMPRESSION: 1. Small bilateral pleural effusions and associated atelectasis or consolidation. 2. No acute noncontrast CT findings of the abdomen or pelvis to explain sepsis or bacteremia. 3. Cirrhosis. 4. Cholelithiasis. 5. Nonobstructive right renal calculi. 6. Sigmoid diverticulosis without evidence of acute diverticulitis. Aortic Atherosclerosis (ICD10-I70.0). Electronically Signed   By: Jearld Lesch M.D.   On: 12/10/2022 15:35   DG Abd 1 View  Result Date: 12/09/2022 CLINICAL DATA:  Abdominal distension, hypotension EXAM: ABDOMEN - 1 VIEW COMPARISON:  10/13/2022 FINDINGS: Three supine frontal views of the abdomen and pelvis are obtained. Pubic symphysis is excluded by collimation. Percutaneous gastrostomy tube overlies the central upper abdomen. The bowel gas pattern is unremarkable without obstruction or ileus. Stable 7 mm right renal calculus. Numerous canal vein phleboliths are again noted. No abdominal mass. IVC filter again noted. Degenerative changes of  the lower lumbar spine and bilateral hips. Lung bases are clear. IMPRESSION: 1. Unremarkable bowel gas pattern. 2. Stable 7 mm right renal calculus. Electronically Signed   By: Sharlet Salina M.D.   On: 12/09/2022 22:18   US Carotid Bilateral  Result Date: 12/08/2022 CLINICAL DATA:  TIA Hypertension Diabetes Former tobacco user EXAM: BILATERAL CAROTID DUPLEX ULTRASOUND TECHNIQUE: Wallace Cullens scale imaging, color Doppler and duplex ultrasound were performed  of bilateral carotid and vertebral arteries in the neck. COMPARISON:  08/03/2013 FINDINGS: Criteria: Quantification of carotid stenosis is based on velocity parameters that correlate the residual internal carotid diameter with NASCET-based stenosis levels, using the diameter of the distal internal carotid lumen as the denominator for stenosis measurement. The following velocity measurements were obtained: RIGHT ICA: 75/11 cm/sec CCA: 94/16 cm/sec SYSTOLIC ICA/CCA RATIO:  0.8 ECA: 135 cm/sec LEFT ICA: 90/17 cm/sec CCA: 76/15 cm/sec SYSTOLIC ICA/CCA RATIO:  1.2 ECA: 60 cm/sec RIGHT CAROTID ARTERY: No significant atheromatous plaque. RIGHT VERTEBRAL ARTERY:  Antegrade flow. LEFT CAROTID ARTERY:  No significant atheromatous plaque. LEFT VERTEBRAL ARTERY:  Antegrade flow. IMPRESSION: No significant stenosis of internal carotid arteries. Electronically Signed   By: Acquanetta Belling M.D.   On: 12/08/2022 16:55   ECHOCARDIOGRAM COMPLETE  Result Date: 12/07/2022    ECHOCARDIOGRAM REPORT   Patient Name:   [facility] Date of Exam: 12/07/2022 Medical Rec #:  [id number]           Height:       64.0 in Accession #:    [id number]          Weight:       192.0 lb Date of Birth:  1950-05-18           BSA:          1.923 m Patient Age:    72 years            BP:           99/69 mmHg Patient Gender: F                   HR:           92 bpm. Exam Location:  [facility] Procedure: 2D Echo and Intracardiac Opacification Agent Indications:     Bacteremia R78.81  History:         Patient has  prior history of Echocardiogram examinations, most                  recent 05/16/2021.  Sonographer:     Overton Mam RDCS, FASE Referring Phys:  [id number] Judithe Modest Diagnosing Phys: Dietrich Pates MD  Sonographer Comments: Technically challenging study due to limited acoustic windows, patient is obese and no subcostal window. Image acquisition challenging due to patient body habitus and Image acquisition challenging due to respiratory motion. IMPRESSIONS  1. Left ventricular ejection fraction, by estimation, is 60 to 65%. The left ventricle has normal function. The left ventricle has no regional wall motion abnormalities.  2. Right ventricular systolic function is low normal. The right ventricular size is mildly enlarged. Mildly increased right ventricular wall thickness.  3. Trivial mitral valve regurgitation.  4. Aortic valve regurgitation is not visualized. No aortic stenosis is present. Comparison(s): The left ventricular function is unchanged. FINDINGS  Left Ventricle: Left ventricular ejection fraction, by estimation, is 60 to 65%. The left ventricle has normal function. The left ventricle has no regional wall motion abnormalities. Definity contrast agent was given IV to delineate the left ventricular  endocardial borders. The left ventricular internal cavity size was normal in size. There is no left ventricular hypertrophy. Right Ventricle: The right ventricular size is mildly enlarged. Mildly increased right ventricular wall thickness. Right ventricular systolic function is low normal. Left Atrium: Left atrial size was normal in size. Right Atrium: Right atrial size was normal in size. Pericardium: There is no evidence of pericardial effusion. Mitral Valve: Mild mitral annular  calcification. Trivial mitral valve regurgitation. Tricuspid Valve: The tricuspid valve is normal in structure. Tricuspid valve regurgitation is mild. Aortic Valve: Aortic valve regurgitation is not visualized. No aortic stenosis  is present. Aortic valve peak gradient measures 9.2 mmHg. Pulmonic Valve: The pulmonic valve was not well visualized. Pulmonic valve regurgitation is mild. No evidence of pulmonic stenosis. Aorta: The aortic root is normal in size and structure. IAS/Shunts: The interatrial septum was not assessed. Additional Comments: A device lead is visualized.  LEFT VENTRICLE PLAX 2D LVIDd:         4.15 cm   Diastology LVIDs:         2.75 cm   LV e' medial:    5.11 cm/s LV PW:         1.05 cm   LV E/e' medial:  12.1 LV IVS:        1.05 cm   LV e' lateral:   8.59 cm/s LVOT diam:     1.80 cm   LV E/e' lateral: 7.2 LV SV:         39 LV SV Index:   20 LVOT Area:     2.54 cm  LEFT ATRIUM           Index        RIGHT ATRIUM          Index LA diam:      3.20 cm 1.66 cm/m   RA Area:     5.68 cm LA Vol (A4C): 20.6 ml 10.71 ml/m  RA Volume:   8.95 ml  4.65 ml/m  AORTIC VALVE                 PULMONIC VALVE AV Area (Vmax): 1.74 cm     PV Vmax:       1.30 m/s AV Vmax:        152.00 cm/s  PV Peak grad:  6.8 mmHg AV Peak Grad:   9.2 mmHg LVOT Vmax:      104.00 cm/s LVOT Vmean:     66.900 cm/s LVOT VTI:       0.153 m  AORTA Ao Root diam: 3.40 cm MITRAL VALVE               TRICUSPID VALVE MV Area (PHT): 4.12 cm    TR Peak grad:   37.9 mmHg MV Decel Time: 184 msec    TR Vmax:        308.00 cm/s MV E velocity: 61.80 cm/s MV A velocity: 83.80 cm/s  SHUNTS MV E/A ratio:  0.74        Systemic VTI:  0.15 m                            Systemic Diam: 1.80 cm Dietrich Pates MD Electronically signed by Dietrich Pates MD Signature Date/Time: 12/07/2022/6:35:47 PM    Final    MR BRAIN WO CONTRAST  Result Date: 12/07/2022 CLINICAL DATA:  Provided history: Neuro deficit, acute, stroke suspected. Additional history provided: Altered mental status, lethargy, decreased responsiveness. EXAM: MRI HEAD WITHOUT CONTRAST TECHNIQUE: Multiplanar, multiecho pulse sequences of the brain and surrounding structures were obtained without intravenous contrast. COMPARISON:   Non-contrast head CT 12/05/2022. FINDINGS: Brain: Mild-to-moderate generalized cerebral atrophy. Multifocal T2 FLAIR hyperintense signal abnormality within the cerebral white matter, nonspecific but compatible with minimal chronic small vessel ischemic disease. Curvilinear susceptibility weighted-signal loss within a sulcus along the mid-to-posterior left frontal lobe. This may  reflect a vessel or a small amount of chronic subarachnoid hemorrhage. Expanded and partially empty sella turcica. There is no acute infarct. No evidence of an intracranial mass. No extra-axial fluid collection. No midline shift. Vascular: Maintained flow voids within the proximal large arterial vessels. Possible left frontal lobe developmental venous anomaly (anatomic variant). Skull and upper cervical spine: No focal suspicious marrow lesion. Sinuses/Orbits: No mass or acute finding within the imaged orbits. Postsurgical appearance of the paranasal sinuses. Frothy secretions, and moderate background mucosal thickening, within the right sphenoid sinus. Moderate mucosal thickening within the left sphenoid sinus. Scattered opacification of bilateral ethmoid air cells, overall mild-to-moderate in severity. Mild mucosal thickening within the bilateral frontal sinuses. Other: Trace fluid within the bilateral mastoid air cells. Other: 7 mm Tornwaldt cyst within the midline posterior nasopharynx. IMPRESSION: 1. No evidence of an acute intracranial abnormality. 2. Minimal chronic small vessel ischemic changes within the cerebral white matter. 3. Possible small amount of chronic subarachnoid hemorrhage along the mid-to-posterior left frontal lobe, as described. 4. Mild-to-moderate generalized cerebral atrophy. 5. Paranasal sinus disease as described. Electronically Signed   By: Jackey Loge D.O.   On: 12/07/2022 10:18   CT HEAD CODE STROKE WO CONTRAST  Result Date: 12/05/2022 CLINICAL DATA:  Code stroke.  Right facial droop and slurred speech  EXAM: CT HEAD WITHOUT CONTRAST TECHNIQUE: Contiguous axial images were obtained from the base of the skull through the vertex without intravenous contrast. RADIATION DOSE REDUCTION: This exam was performed according to the departmental dose-optimization program which includes automated exposure control, adjustment of the mA and/or kV according to patient size and/or use of iterative reconstruction technique. COMPARISON:  None Available. FINDINGS: Brain: There is no mass, hemorrhage or extra-axial collection. The size and configuration of the ventricles and extra-axial CSF spaces are normal. The brain parenchyma is normal, without evidence of acute or chronic infarction. Vascular: No abnormal hyperdensity of the major intracranial arteries or dural venous sinuses. No intracranial atherosclerosis. Skull: The visualized skull base, calvarium and extracranial soft tissues are normal. Sinuses/Orbits: No fluid levels or advanced mucosal thickening of the visualized paranasal sinuses. No mastoid or middle ear effusion. The orbits are normal. ASPECTS [facility] Stroke Program Early CT Score) - Ganglionic level infarction (caudate, lentiform nuclei, internal capsule, insula, M1-M3 cortex): 7 - Supraganglionic infarction (M4-M6 cortex): 3 Total score (0-10 with 10 being normal): 10 IMPRESSION: 1. No acute intracranial abnormality. 2. ASPECTS is 10. These results were called by telephone at the time of interpretation on 12/05/2022 at 9:39 pm to provider [facility] , who verbally acknowledged these results. Electronically Signed   By: Deatra Robinson M.D.   On: 12/05/2022 21:39   DG Chest Port 1 View  Result Date: 12/05/2022 CLINICAL DATA:  Chest pain EXAM: PORTABLE CHEST 1 VIEW COMPARISON:  06/10/2021 FINDINGS: Cardiac shadow is mildly enlarged. Dialysis catheter is noted on the right new from the prior exam. Lungs are clear bilaterally. Mild central vascular prominence is noted likely related to volume overload. No effusion  is seen. No bony abnormality is noted. IMPRESSION: Mild vascular congestion likely related to volume overload. Electronically Signed   By: Alcide Clever M.D.   On: 12/05/2022 21:21    Microbiology: Results for orders placed or performed during the hospital encounter of 12/05/22  Culture, blood (Routine x 2)     Status: Abnormal   Collection Time: 12/05/22  8:56 PM   Specimen: BLOOD  Result Value Ref Range Status   Specimen Description   Final  BLOOD BLOOD RIGHT ARM Performed at [facility], [address] [place], [place] [address]    Special Requests   Final    BOTTLES DRAWN AEROBIC AND ANAEROBIC Blood Culture adequate volume Performed at [facility], [address] [place], [place] [address]    Culture  Setup Time   Final    GRAM POSITIVE COCCI IN BOTH AEROBIC AND ANAEROBIC BOTTLES CRITICAL RESULT CALLED TO, READ BACK BY AND VERIFIED WITH: SHEEMA HALLLAJI AT 1206 12/06/22.PMF Performed at [facility], [address] [place], [place] [address]    Culture (A)  Final    STAPHYLOCOCCUS AUREUS SUSCEPTIBILITIES PERFORMED ON PREVIOUS CULTURE WITHIN THE LAST 5 DAYS. Performed at [facility], [address] [place], [place] [address]    Report Status 12/08/2022 FINAL  Final  Culture, blood (Routine x 2)     Status: Abnormal   Collection Time: 12/05/22  8:57 PM   Specimen: BLOOD  Result Value Ref Range Status   Specimen Description   Final    BLOOD BLOOD LEFT ARM Performed at [facility], [address], [place], [place] [address]    Special Requests   Final    BOTTLES DRAWN AEROBIC AND ANAEROBIC Blood Culture adequate volume Performed at [facility], [address] [place], [place] [address]    Culture  Setup Time   Final    GRAM POSITIVE COCCI IN BOTH AEROBIC AND ANAEROBIC BOTTLES CRITICAL RESULT CALLED TO, READ BACK BY AND VERIFIED WITH: SHEEMA HALLAJI AT 1206 12/06/22.PMF Performed at [facility],  [address] [place], [place] [address]    Culture METHICILLIN RESISTANT STAPHYLOCOCCUS AUREUS (A)  Final   Report Status 12/08/2022 FINAL  Final   Organism ID, Bacteria METHICILLIN RESISTANT STAPHYLOCOCCUS AUREUS  Final      Susceptibility   Methicillin resistant staphylococcus aureus - MIC*    CIPROFLOXACIN >=8 RESISTANT Resistant     ERYTHROMYCIN >=8 RESISTANT Resistant     GENTAMICIN <=0.5 SENSITIVE Sensitive     OXACILLIN >=4 RESISTANT Resistant     TETRACYCLINE <=1 SENSITIVE Sensitive     VANCOMYCIN <=0.5 SENSITIVE Sensitive     TRIMETH/SULFA >=320 RESISTANT Resistant     CLINDAMYCIN <=0.25 SENSITIVE Sensitive     RIFAMPIN <=0.5 SENSITIVE Sensitive     Inducible Clindamycin NEGATIVE Sensitive     LINEZOLID 2 SENSITIVE Sensitive     * METHICILLIN RESISTANT STAPHYLOCOCCUS AUREUS  Blood Culture ID Panel (Reflexed)     Status: Abnormal   Collection Time: 12/05/22  8:57 PM  Result Value Ref Range Status   Enterococcus faecalis NOT DETECTED NOT DETECTED Final   Enterococcus Faecium NOT DETECTED NOT DETECTED Final   Listeria monocytogenes NOT DETECTED NOT DETECTED Final   Staphylococcus species DETECTED (A) NOT DETECTED Final    Comment: CRITICAL RESULT CALLED TO, READ BACK BY AND VERIFIED WITH: SHEEMA HALLAJI AT 1206 12/06/22.PMF    Staphylococcus aureus (BCID) DETECTED (A) NOT DETECTED Final    Comment: Methicillin (oxacillin)-resistant Staphylococcus aureus (MRSA). MRSA is predictably resistant to beta-lactam antibiotics (except ceftaroline). Preferred therapy is vancomycin unless clinically contraindicated. Patient requires contact precautions if  hospitalized. CRITICAL RESULT CALLED TO, READ BACK BY AND VERIFIED WITH: SHEEMA HALLAJI AT 1206 12/06/22.PMF    Staphylococcus epidermidis DETECTED (A) NOT DETECTED Final    Comment: CRITICAL RESULT CALLED TO, READ BACK BY AND VERIFIED WITH: SHEEMA HALLAJI AT 1206 12/06/22.PMF    Staphylococcus lugdunensis NOT DETECTED NOT DETECTED  Final  Streptococcus species NOT DETECTED NOT DETECTED Final   Streptococcus agalactiae NOT DETECTED NOT DETECTED Final   Streptococcus pneumoniae NOT DETECTED NOT DETECTED Final   Streptococcus pyogenes NOT DETECTED NOT DETECTED Final   A.calcoaceticus-baumannii NOT DETECTED NOT DETECTED Final   Bacteroides fragilis NOT DETECTED NOT DETECTED Final   Enterobacterales NOT DETECTED NOT DETECTED Final   Enterobacter cloacae complex NOT DETECTED NOT DETECTED Final   Escherichia coli NOT DETECTED NOT DETECTED Final   Klebsiella aerogenes NOT DETECTED NOT DETECTED Final   Klebsiella oxytoca NOT DETECTED NOT DETECTED Final   Klebsiella pneumoniae NOT DETECTED NOT DETECTED Final   Proteus species NOT DETECTED NOT DETECTED Final   Salmonella species NOT DETECTED NOT DETECTED Final   Serratia marcescens NOT DETECTED NOT DETECTED Final   Haemophilus influenzae NOT DETECTED NOT DETECTED Final   Neisseria meningitidis NOT DETECTED NOT DETECTED Final   Pseudomonas aeruginosa NOT DETECTED NOT DETECTED Final   Stenotrophomonas maltophilia NOT DETECTED NOT DETECTED Final   Candida albicans NOT DETECTED NOT DETECTED Final   Candida auris NOT DETECTED NOT DETECTED Final   Candida glabrata NOT DETECTED NOT DETECTED Final   Candida krusei NOT DETECTED NOT DETECTED Final   Candida parapsilosis NOT DETECTED NOT DETECTED Final   Candida tropicalis NOT DETECTED NOT DETECTED Final   Cryptococcus neoformans/gattii NOT DETECTED NOT DETECTED Final   Methicillin resistance mecA/C DETECTED (A) NOT DETECTED Final    Comment: CRITICAL RESULT CALLED TO, READ BACK BY AND VERIFIED WITH: SHEEMA HALLAJI AT 1206 12/06/22.PMF    Meth resistant mecA/C and MREJ DETECTED (A) NOT DETECTED Final    Comment: CRITICAL RESULT CALLED TO, READ BACK BY AND VERIFIED WITH: SHEEMA HALLAJI AT 1206 12/06/22.PMF Performed at [facility], [address] [place], [place] [address]   Resp panel by RT-PCR (RSV, Flu A&B, Covid)  Anterior Nasal Swab     Status: None   Collection Time: 12/05/22  9:53 PM   Specimen: Anterior Nasal Swab  Result Value Ref Range Status   SARS Coronavirus 2 by RT PCR NEGATIVE NEGATIVE Final    Comment: (NOTE) SARS-CoV-2 target nucleic acids are NOT DETECTED.  The SARS-CoV-2 RNA is generally detectable in upper respiratory specimens during the acute phase of infection. The lowest concentration of SARS-CoV-2 viral copies this assay can detect is 138 copies/mL. A negative result does not preclude SARS-Cov-2 infection and should not be used as the sole basis for treatment or other patient management decisions. A negative result may occur with  improper specimen collection/handling, submission of specimen other than nasopharyngeal swab, presence of viral mutation(s) within the areas targeted by this assay, and inadequate number of viral copies(<138 copies/mL). A negative result must be combined with clinical observations, patient history, and epidemiological information. The expected result is Negative.  Fact Sheet for Patients:  BloggerCourse.com  Fact Sheet for Healthcare Providers:  SeriousBroker.it  This test is no t yet approved or cleared by the [place] FDA and  has been authorized for detection and/or diagnosis of SARS-CoV-2 by FDA under an Emergency Use Authorization (EUA). This EUA will remain  in effect (meaning this test can be used) for the duration of the COVID-19 declaration under Section 564(b)(1) of the Act, 21 U.S.C.section 360bbb-3(b)(1), unless the authorization is terminated  or revoked sooner.       Influenza A by PCR NEGATIVE NEGATIVE Final   Influenza B by PCR NEGATIVE NEGATIVE Final    Comment: (NOTE) The Xpert Xpress SARS-CoV-2/FLU/RSV plus assay is intended as an  aid in the diagnosis of influenza from Nasopharyngeal swab specimens and should not be used as a sole basis for treatment. Nasal washings  and aspirates are unacceptable for Xpert Xpress SARS-CoV-2/FLU/RSV testing.  Fact Sheet for Patients: BloggerCourse.com  Fact Sheet for Healthcare Providers: SeriousBroker.it  This test is not yet approved or cleared by the [place] FDA and has been authorized for detection and/or diagnosis of SARS-CoV-2 by FDA under an Emergency Use Authorization (EUA). This EUA will remain in effect (meaning this test can be used) for the duration of the COVID-19 declaration under Section 564(b)(1) of the Act, 21 U.S.C. section 360bbb-3(b)(1), unless the authorization is terminated or revoked.     Resp Syncytial Virus by PCR NEGATIVE NEGATIVE Final    Comment: (NOTE) Fact Sheet for Patients: BloggerCourse.com  Fact Sheet for Healthcare Providers: SeriousBroker.it  This test is not yet approved or cleared by the [place] FDA and has been authorized for detection and/or diagnosis of SARS-CoV-2 by FDA under an Emergency Use Authorization (EUA). This EUA will remain in effect (meaning this test can be used) for the duration of the COVID-19 declaration under Section 564(b)(1) of the Act, 21 U.S.C. section 360bbb-3(b)(1), unless the authorization is terminated or revoked.  Performed at [facility], [address] [place], [place] [address]   MRSA Next Gen by PCR, Nasal     Status: Abnormal   Collection Time: 12/06/22  4:27 AM   Specimen: Nasal Mucosa; Nasal Swab  Result Value Ref Range Status   MRSA by PCR Next Gen DETECTED (A) NOT DETECTED Final    Comment: RESULT CALLED TO, READ BACK BY AND VERIFIED WITH: KRISTINE CHAMBERS AT 2725 12/06/22.PMF (NOTE) The GeneXpert MRSA Assay (FDA approved for NASAL specimens only), is one component of a comprehensive MRSA colonization surveillance program. It is not intended to diagnose MRSA infection nor to guide or monitor treatment  for MRSA infections. Test performance is not FDA approved in patients less than 44 years old. Performed at [facility], [address], [place], [place] [address]   Cath Tip Culture     Status: Abnormal   Collection Time: 12/06/22  2:22 PM   Specimen: Catheter Tip; Other  Result Value Ref Range Status   Specimen Description   Final    CATH TIP Performed at [facility], [address] [place], [place] [address]    Special Requests   Final    NONE Performed at [facility], [address] [place], [place] [address]    Culture (A)  Final    >=100,000 COLONIES/mL METHICILLIN RESISTANT STAPHYLOCOCCUS AUREUS   Report Status 12/08/2022 FINAL  Final   Organism ID, Bacteria METHICILLIN RESISTANT STAPHYLOCOCCUS AUREUS (A)  Final      Susceptibility   Methicillin resistant staphylococcus aureus - MIC*    CIPROFLOXACIN >=8 RESISTANT Resistant     ERYTHROMYCIN >=8 RESISTANT Resistant     GENTAMICIN <=0.5 SENSITIVE Sensitive     OXACILLIN >=4 RESISTANT Resistant     TETRACYCLINE <=1 SENSITIVE Sensitive     VANCOMYCIN <=0.5 SENSITIVE Sensitive     TRIMETH/SULFA >=320 RESISTANT Resistant     CLINDAMYCIN <=0.25 SENSITIVE Sensitive     RIFAMPIN <=0.5 SENSITIVE Sensitive     Inducible Clindamycin NEGATIVE Sensitive     LINEZOLID 2 SENSITIVE Sensitive     * >=100,000 COLONIES/mL METHICILLIN RESISTANT STAPHYLOCOCCUS AUREUS  Urine Culture (for pregnant, neutropenic or urologic patients or patients with an indwelling urinary catheter)  Status: Abnormal   Collection Time: 12/06/22  5:41 PM   Specimen: Urine, Clean Catch  Result Value Ref Range Status   Specimen Description   Final    URINE, CLEAN CATCH Performed at [facility], [address] [place], [place] [address]    Special Requests   Final    Normal Performed at [facility], [address] [place], [place] [address]    Culture (A)  Final    70,000 COLONIES/mL ENTEROCOCCUS  FAECALIS 70,000 COLONIES/mL YEAST    Report Status 12/09/2022 FINAL  Final   Organism ID, Bacteria ENTEROCOCCUS FAECALIS (A)  Final      Susceptibility   Enterococcus faecalis - MIC*    AMPICILLIN <=2 SENSITIVE Sensitive     NITROFURANTOIN <=16 SENSITIVE Sensitive     VANCOMYCIN 1 SENSITIVE Sensitive     * 70,000 COLONIES/mL ENTEROCOCCUS FAECALIS  Culture, blood (Routine X 2) w Reflex to ID Panel     Status: None   Collection Time: 12/07/22  1:31 PM   Specimen: BLOOD  Result Value Ref Range Status   Specimen Description BLOOD BLOOD RIGHT HAND  Final   Special Requests   Final    BOTTLES DRAWN AEROBIC ONLY Blood Culture adequate volume   Culture   Final    NO GROWTH 5 DAYS Performed at [facility], [address] [place], [place] [address]    Report Status 12/12/2022 FINAL  Final  Culture, blood (Routine X 2) w Reflex to ID Panel     Status: None   Collection Time: 12/07/22  6:41 PM   Specimen: BLOOD  Result Value Ref Range Status   Specimen Description BLOOD BLOOD RIGHT HAND  Final   Special Requests   Final    BOTTLES DRAWN AEROBIC AND ANAEROBIC Blood Culture results may not be optimal due to an inadequate volume of blood received in culture bottles   Culture   Final    NO GROWTH 5 DAYS Performed at [facility], [address] [place], [place] [address]    Report Status 12/12/2022 FINAL  Final  C Difficile Quick Screen (NO PCR Reflex)     Status: None   Collection Time: 12/12/22  4:07 PM   Specimen: STOOL  Result Value Ref Range Status   C Diff antigen NEGATIVE NEGATIVE Final   C Diff toxin NEGATIVE NEGATIVE Final   C Diff interpretation No C. difficile detected.  Final    Comment: Performed at [facility], [address] [place], [place] [address]  Gastrointestinal Panel by PCR , Stool     Status: None   Collection Time: 12/13/22  8:38 AM   Specimen: Stool  Result Value Ref Range Status   Campylobacter species NOT DETECTED NOT  DETECTED Final   Plesimonas shigelloides NOT DETECTED NOT DETECTED Final   Salmonella species NOT DETECTED NOT DETECTED Final   Yersinia enterocolitica NOT DETECTED NOT DETECTED Final   Vibrio species NOT DETECTED NOT DETECTED Final   Vibrio cholerae NOT DETECTED NOT DETECTED Final   Enteroaggregative E coli (EAEC) NOT DETECTED NOT DETECTED Final   Enteropathogenic E coli (EPEC) NOT DETECTED NOT DETECTED Final   Enterotoxigenic E coli (ETEC) NOT DETECTED NOT DETECTED Final   Shiga like toxin producing E coli (STEC) NOT DETECTED NOT DETECTED Final   Shigella/Enteroinvasive E coli (EIEC) NOT DETECTED NOT DETECTED Final   Cryptosporidium NOT DETECTED NOT DETECTED Final   Cyclospora cayetanensis NOT DETECTED NOT DETECTED Final   Entamoeba  histolytica NOT DETECTED NOT DETECTED Final   Giardia lamblia NOT DETECTED NOT DETECTED Final   Adenovirus F40/41 NOT DETECTED NOT DETECTED Final   Astrovirus NOT DETECTED NOT DETECTED Final   Norovirus GI/GII NOT DETECTED NOT DETECTED Final   Rotavirus A NOT DETECTED NOT DETECTED Final   Sapovirus (I, II, IV, and V) NOT DETECTED NOT DETECTED Final    Comment: Performed at [facility], [address] [place], [place] [address]    Labs: CBC: Recent Labs  Lab 12/27/22 1915 12/28/22 0555 12/30/22 0405 12/30/22 1034 01/01/23 1138 01/03/23 0845  WBC 13.6*  --  12.6* 23.5* 17.0* 14.6*  HGB 10.2* 10.2* 10.8* 12.5 10.4* 10.1*  HCT 33.0*  --  36.9 41.4 33.8* 33.3*  MCV 88.9  --  90.0 89.4 88.3 88.8  PLT 485*  --  678* 657* 661* 611*   Basic Metabolic Panel: Recent Labs  Lab 12/31/22 0250 01/01/23 0500 01/01/23 1324 01/02/23 0501 01/03/23 0845  NA 136 137 137 137 135  K 4.1 3.7 3.9 3.8 3.3*  CL 100 104 102 100 101  CO2 27 24 22 27 26   GLUCOSE 131* 99 82 109* 95  BUN 58* 81* 79* 39* 71*  CREATININE 2.49* 2.92* 3.03* 1.75* 2.48*  CALCIUM 9.7 9.5 9.4 8.7* 9.0  PHOS 2.8 4.1 4.1 2.3* 4.2   Liver Function Tests: Recent Labs  Lab  12/31/22 0250 01/01/23 0500 01/01/23 1324 01/02/23 0501 01/03/23 0845  ALBUMIN 2.8* 2.5* 2.6* 2.5* 2.4*   CBG: Recent Labs  Lab 01/02/23 1537 01/02/23 1959 01/02/23 2337 01/03/23 0417 01/03/23 0733  GLUCAP 134* 125* 101* 115* 122*    Discharge time spent: greater than 30 minutes.  Signed: Enedina Finner, MD [facility] 01/03/2023

## 2023-01-03 NOTE — Discharge Instructions (Signed)
Tube feeling as per instruction Resume HD per your out pt routine

## 2023-01-03 NOTE — TOC Progression Note (Signed)
Transition of Care [facility]) - Progression Note    Patient Details  Name: Alexandra Foster MRN: [id number] Date of Birth: 1951/04/07  Transition of Care [facility]) CM/SW Contact  Susa Simmonds, [place] Phone Number: 01/03/2023, 3:24 PM  Clinical Narrative:   EMS was contacted for transport home. EMS stated they have no ETA and have been short staffed. Patient fourth on the list.     Expected Discharge Plan: Home w Home Health Services Barriers to Discharge: Continued Medical Work up  Expected Discharge Plan and Services     Post Acute Care Choice: Home Health Living arrangements for the past 2 months: Skilled Nursing Facility Expected Discharge Date: 01/03/23                         HH Arranged: PT, OT, RN HH Agency: [facility] Date HH Agency Contacted: 12/24/22 Time HH Agency Contacted: 1310 Representative spoke with at [facility] Agency: Barbara Cower   Social Determinants of Health (SDOH) Interventions SDOH Screenings   Food Insecurity: Patient Unable To Answer (12/06/2022)  Housing: High Risk (12/06/2022)  Transportation Needs: No Transportation Needs (12/06/2022)  Utilities: Not At Risk (12/06/2022)  Alcohol Screen: Low Risk  (02/11/2021)  Depression (PHQ2-9): Low Risk  (04/30/2021)  Financial Resource Strain: Medium Risk (08/09/2020)  Physical Activity: Inactive (08/09/2020)  Social Connections: Moderately Integrated (08/09/2020)  Stress: No Stress Concern Present (08/09/2020)  Tobacco Use: Medium Risk (12/05/2022)    Readmission Risk Interventions    12/26/2022   11:15 AM  Readmission Risk Prevention Plan  Transportation Screening Complete  Medication Review (RN Care Manager) Complete  PCP or Specialist appointment within 3-5 days of discharge Complete  HRI or Home Care Consult Complete  SW Recovery Care/Counseling Consult Complete  Palliative Care Screening Not Applicable  Skilled Nursing Facility Not Applicable

## 2023-01-03 NOTE — Progress Notes (Signed)
Hemodialysis Note  Received patient in bed to unit. Alert and oriented. Informed consent signed and in chart.   Treatment initiated:0839 Treatment completed:1234  Patient tolerated treatment well. Transported back to the room alert, without acute distress. Report given to patient's RN.  Access used: LIJ Catheter  Access issues: Low Arterial Pressure, Flushed with Heparin, Circuit Heparinized   Total UF removed: 980 ml  Medications given: Epogen, Daptomycin   Post HD VS: Stable  Post HD weight: Unable to obtain, patient in chair, BKA  Bartolo Darter, RN [facility]

## 2023-01-03 NOTE — Progress Notes (Signed)
[facility]  ROUNDING NOTE   Subjective:   Patient seen and evaluated during dialysis   HEMODIALYSIS FLOWSHEET:  Blood Flow Rate (mL/min): 299 mL/min Arterial Pressure (mmHg): -182.21 mmHg Venous Pressure (mmHg): 175.55 mmHg TMP (mmHg): 6.66 mmHg Ultrafiltration Rate (mL/min): 673 mL/min Dialysate Flow Rate (mL/min): 300 ml/min Dialysis Fluid Bolus: Normal Saline Bolus Amount (mL): 100 mL  Tolerating treatment well, seated in chair   Objective:  Vital signs in last 24 hours:  Temp:  [98.7 F (37.1 C)-98.8 F (37.1 C)] 98.7 F (37.1 C) (09/21 0832) Pulse Rate:  [63-72] 72 (09/21 1030) Resp:  [16-23] 20 (09/21 1030) BP: (101-121)/(43-83) 105/70 (09/21 1030) SpO2:  [93 %-99 %] 99 % (09/21 1030)  Weight change:  Filed Weights   01/01/23 0819  Weight: 89 kg    Intake/Output: No intake/output data recorded.   Intake/Output this shift:  No intake/output data recorded.  Physical Exam: General: Chronically Ill appearing  Head: Normocephalic, atraumatic. Dry oral mucosal membranes  Eyes: Anicteric  Lungs:  Diminished bilaterally   Heart: regular   Abdomen:  Soft, nontender  Extremities: 1+ peripheral edema.  Neurologic: Alert  Access:  LT permcath placed on 12/19/22    Basic Metabolic Panel: Recent Labs  Lab 12/31/22 0250 01/01/23 0500 01/01/23 1324 01/02/23 0501 01/03/23 0845  NA 136 137 137 137 135  K 4.1 3.7 3.9 3.8 3.3*  CL 100 104 102 100 101  CO2 27 24 22 27 26   GLUCOSE 131* 99 82 109* 95  BUN 58* 81* 79* 39* 71*  CREATININE 2.49* 2.92* 3.03* 1.75* 2.48*  CALCIUM 9.7 9.5 9.4 8.7* 9.0  PHOS 2.8 4.1 4.1 2.3* 4.2    Liver Function Tests: Recent Labs  Lab 12/31/22 0250 01/01/23 0500 01/01/23 1324 01/02/23 0501 01/03/23 0845  ALBUMIN 2.8* 2.5* 2.6* 2.5* 2.4*   No results for input(s): "LIPASE", "AMYLASE" in the last 168 hours. No results for input(s): "AMMONIA" in the last 168 hours.  CBC: Recent Labs  Lab 12/27/22 1915  12/28/22 0555 12/30/22 0405 12/30/22 1034 01/01/23 1138 01/03/23 0845  WBC 13.6*  --  12.6* 23.5* 17.0* 14.6*  HGB 10.2* 10.2* 10.8* 12.5 10.4* 10.1*  HCT 33.0*  --  36.9 41.4 33.8* 33.3*  MCV 88.9  --  90.0 89.4 88.3 88.8  PLT 485*  --  678* 657* 661* 611*    Cardiac Enzymes: No results for input(s): "CKTOTAL", "CKMB", "CKMBINDEX", "TROPONINI" in the last 168 hours.   BNP: Invalid input(s): "POCBNP"  CBG: Recent Labs  Lab 01/02/23 1537 01/02/23 1959 01/02/23 2337 01/03/23 0417 01/03/23 0733  GLUCAP 134* 125* 101* 115* 122*    Microbiology: Results for orders placed or performed during the hospital encounter of 12/05/22  Culture, blood (Routine x 2)     Status: Abnormal   Collection Time: 12/05/22  8:56 PM   Specimen: BLOOD  Result Value Ref Range Status   Specimen Description   Final    BLOOD BLOOD RIGHT ARM Performed at [facility], [address], [place], [place] [address]    Special Requests   Final    BOTTLES DRAWN AEROBIC AND ANAEROBIC Blood Culture adequate volume Performed at [facility], [address] [place], [place] [address]    Culture  Setup Time   Final    GRAM POSITIVE COCCI IN BOTH AEROBIC AND ANAEROBIC BOTTLES CRITICAL RESULT CALLED TO, READ BACK BY AND VERIFIED WITH: SHEEMA HALLLAJI AT 1206 12/06/22.PMF Performed at [facility], [address] [place], [place] [address]    Culture (A)  Final    STAPHYLOCOCCUS AUREUS SUSCEPTIBILITIES PERFORMED ON PREVIOUS CULTURE WITHIN THE LAST 5 DAYS. Performed at [facility], [address], [place], [place] [address]    Report Status 12/08/2022 FINAL  Final  Culture, blood (Routine x 2)     Status: Abnormal   Collection Time: 12/05/22  8:57 PM   Specimen: BLOOD  Result Value Ref Range Status   Specimen Description   Final    BLOOD BLOOD LEFT ARM Performed at [facility], [address] [place], [place] [address]    Special Requests   Final     BOTTLES DRAWN AEROBIC AND ANAEROBIC Blood Culture adequate volume Performed at [facility], [address] [place], [place] [address]    Culture  Setup Time   Final    GRAM POSITIVE COCCI IN BOTH AEROBIC AND ANAEROBIC BOTTLES CRITICAL RESULT CALLED TO, READ BACK BY AND VERIFIED WITH: SHEEMA HALLAJI AT 1206 12/06/22.PMF Performed at [facility], [address], [place], [place] [address]    Culture METHICILLIN RESISTANT STAPHYLOCOCCUS AUREUS (A)  Final   Report Status 12/08/2022 FINAL  Final   Organism ID, Bacteria METHICILLIN RESISTANT STAPHYLOCOCCUS AUREUS  Final      Susceptibility   Methicillin resistant staphylococcus aureus - MIC*    CIPROFLOXACIN >=8 RESISTANT Resistant     ERYTHROMYCIN >=8 RESISTANT Resistant     GENTAMICIN <=0.5 SENSITIVE Sensitive     OXACILLIN >=4 RESISTANT Resistant     TETRACYCLINE <=1 SENSITIVE Sensitive     VANCOMYCIN <=0.5 SENSITIVE Sensitive     TRIMETH/SULFA >=320 RESISTANT Resistant     CLINDAMYCIN <=0.25 SENSITIVE Sensitive     RIFAMPIN <=0.5 SENSITIVE Sensitive     Inducible Clindamycin NEGATIVE Sensitive     LINEZOLID 2 SENSITIVE Sensitive     * METHICILLIN RESISTANT STAPHYLOCOCCUS AUREUS  Blood Culture ID Panel (Reflexed)     Status: Abnormal   Collection Time: 12/05/22  8:57 PM  Result Value Ref Range Status   Enterococcus faecalis NOT DETECTED NOT DETECTED Final   Enterococcus Faecium NOT DETECTED NOT DETECTED Final   Listeria monocytogenes NOT DETECTED NOT DETECTED Final   Staphylococcus species DETECTED (A) NOT DETECTED Final    Comment: CRITICAL RESULT CALLED TO, READ BACK BY AND VERIFIED WITH: SHEEMA HALLAJI AT 1206 12/06/22.PMF    Staphylococcus aureus (BCID) DETECTED (A) NOT DETECTED Final    Comment: Methicillin (oxacillin)-resistant Staphylococcus aureus (MRSA). MRSA is predictably resistant to beta-lactam antibiotics (except ceftaroline). Preferred therapy is vancomycin unless clinically contraindicated. Patient  requires contact precautions if  hospitalized. CRITICAL RESULT CALLED TO, READ BACK BY AND VERIFIED WITH: SHEEMA HALLAJI AT 1206 12/06/22.PMF    Staphylococcus epidermidis DETECTED (A) NOT DETECTED Final    Comment: CRITICAL RESULT CALLED TO, READ BACK BY AND VERIFIED WITH: SHEEMA HALLAJI AT 1206 12/06/22.PMF    Staphylococcus lugdunensis NOT DETECTED NOT DETECTED Final   Streptococcus species NOT DETECTED NOT DETECTED Final   Streptococcus agalactiae NOT DETECTED NOT DETECTED Final   Streptococcus pneumoniae NOT DETECTED NOT DETECTED Final   Streptococcus pyogenes NOT DETECTED NOT DETECTED Final   A.calcoaceticus-baumannii NOT DETECTED NOT DETECTED Final   Bacteroides fragilis NOT DETECTED NOT DETECTED Final   Enterobacterales NOT DETECTED NOT DETECTED Final   Enterobacter cloacae complex NOT DETECTED NOT DETECTED Final   Escherichia coli NOT DETECTED NOT DETECTED Final   Klebsiella aerogenes NOT DETECTED NOT DETECTED Final   Klebsiella oxytoca NOT  DETECTED NOT DETECTED Final   Klebsiella pneumoniae NOT DETECTED NOT DETECTED Final   Proteus species NOT DETECTED NOT DETECTED Final   Salmonella species NOT DETECTED NOT DETECTED Final   Serratia marcescens NOT DETECTED NOT DETECTED Final   Haemophilus influenzae NOT DETECTED NOT DETECTED Final   Neisseria meningitidis NOT DETECTED NOT DETECTED Final   Pseudomonas aeruginosa NOT DETECTED NOT DETECTED Final   Stenotrophomonas maltophilia NOT DETECTED NOT DETECTED Final   Candida albicans NOT DETECTED NOT DETECTED Final   Candida auris NOT DETECTED NOT DETECTED Final   Candida glabrata NOT DETECTED NOT DETECTED Final   Candida krusei NOT DETECTED NOT DETECTED Final   Candida parapsilosis NOT DETECTED NOT DETECTED Final   Candida tropicalis NOT DETECTED NOT DETECTED Final   Cryptococcus neoformans/gattii NOT DETECTED NOT DETECTED Final   Methicillin resistance mecA/C DETECTED (A) NOT DETECTED Final    Comment: CRITICAL RESULT CALLED TO,  READ BACK BY AND VERIFIED WITH: SHEEMA HALLAJI AT 1206 12/06/22.PMF    Meth resistant mecA/C and MREJ DETECTED (A) NOT DETECTED Final    Comment: CRITICAL RESULT CALLED TO, READ BACK BY AND VERIFIED WITH: SHEEMA HALLAJI AT 1206 12/06/22.PMF Performed at [facility], [address] [place], [place] [address]   Resp panel by RT-PCR (RSV, Flu A&B, Covid) Anterior Nasal Swab     Status: None   Collection Time: 12/05/22  9:53 PM   Specimen: Anterior Nasal Swab  Result Value Ref Range Status   SARS Coronavirus 2 by RT PCR NEGATIVE NEGATIVE Final    Comment: (NOTE) SARS-CoV-2 target nucleic acids are NOT DETECTED.  The SARS-CoV-2 RNA is generally detectable in upper respiratory specimens during the acute phase of infection. The lowest concentration of SARS-CoV-2 viral copies this assay can detect is 138 copies/mL. A negative result does not preclude SARS-Cov-2 infection and should not be used as the sole basis for treatment or other patient management decisions. A negative result may occur with  improper specimen collection/handling, submission of specimen other than nasopharyngeal swab, presence of viral mutation(s) within the areas targeted by this assay, and inadequate number of viral copies(<138 copies/mL). A negative result must be combined with clinical observations, patient history, and epidemiological information. The expected result is Negative.  Fact Sheet for Patients:  BloggerCourse.com  Fact Sheet for Healthcare Providers:  SeriousBroker.it  This test is no t yet approved or cleared by the [place] FDA and  has been authorized for detection and/or diagnosis of SARS-CoV-2 by FDA under an Emergency Use Authorization (EUA). This EUA will remain  in effect (meaning this test can be used) for the duration of the COVID-19 declaration under Section 564(b)(1) of the Act, 21 U.S.C.section 360bbb-3(b)(1), unless the  authorization is terminated  or revoked sooner.       Influenza A by PCR NEGATIVE NEGATIVE Final   Influenza B by PCR NEGATIVE NEGATIVE Final    Comment: (NOTE) The Xpert Xpress SARS-CoV-2/FLU/RSV plus assay is intended as an aid in the diagnosis of influenza from Nasopharyngeal swab specimens and should not be used as a sole basis for treatment. Nasal washings and aspirates are unacceptable for Xpert Xpress SARS-CoV-2/FLU/RSV testing.  Fact Sheet for Patients: BloggerCourse.com  Fact Sheet for Healthcare Providers: SeriousBroker.it  This test is not yet approved or cleared by the [place] FDA and has been authorized for detection and/or diagnosis of SARS-CoV-2 by FDA under an Emergency Use Authorization (EUA). This EUA will remain in effect (meaning this test can be used) for the  duration of the COVID-19 declaration under Section 564(b)(1) of the Act, 21 U.S.C. section 360bbb-3(b)(1), unless the authorization is terminated or revoked.     Resp Syncytial Virus by PCR NEGATIVE NEGATIVE Final    Comment: (NOTE) Fact Sheet for Patients: BloggerCourse.com  Fact Sheet for Healthcare Providers: SeriousBroker.it  This test is not yet approved or cleared by the [place] FDA and has been authorized for detection and/or diagnosis of SARS-CoV-2 by FDA under an Emergency Use Authorization (EUA). This EUA will remain in effect (meaning this test can be used) for the duration of the COVID-19 declaration under Section 564(b)(1) of the Act, 21 U.S.C. section 360bbb-3(b)(1), unless the authorization is terminated or revoked.  Performed at [facility], [address] [place], [place] [address]   MRSA Next Gen by PCR, Nasal     Status: Abnormal   Collection Time: 12/06/22  4:27 AM   Specimen: Nasal Mucosa; Nasal Swab  Result Value Ref Range Status   MRSA by PCR Next  Gen DETECTED (A) NOT DETECTED Final    Comment: RESULT CALLED TO, READ BACK BY AND VERIFIED WITH: KRISTINE CHAMBERS AT 6045 12/06/22.PMF (NOTE) The GeneXpert MRSA Assay (FDA approved for NASAL specimens only), is one component of a comprehensive MRSA colonization surveillance program. It is not intended to diagnose MRSA infection nor to guide or monitor treatment for MRSA infections. Test performance is not FDA approved in patients less than 27 years old. Performed at [facility], [address], [place], [place] [address]   Cath Tip Culture     Status: Abnormal   Collection Time: 12/06/22  2:22 PM   Specimen: Catheter Tip; Other  Result Value Ref Range Status   Specimen Description   Final    CATH TIP Performed at [facility], [address] [place], [place] [address]    Special Requests   Final    NONE Performed at [facility], [address] [place], [place] [address]    Culture (A)  Final    >=100,000 COLONIES/mL METHICILLIN RESISTANT STAPHYLOCOCCUS AUREUS   Report Status 12/08/2022 FINAL  Final   Organism ID, Bacteria METHICILLIN RESISTANT STAPHYLOCOCCUS AUREUS (A)  Final      Susceptibility   Methicillin resistant staphylococcus aureus - MIC*    CIPROFLOXACIN >=8 RESISTANT Resistant     ERYTHROMYCIN >=8 RESISTANT Resistant     GENTAMICIN <=0.5 SENSITIVE Sensitive     OXACILLIN >=4 RESISTANT Resistant     TETRACYCLINE <=1 SENSITIVE Sensitive     VANCOMYCIN <=0.5 SENSITIVE Sensitive     TRIMETH/SULFA >=320 RESISTANT Resistant     CLINDAMYCIN <=0.25 SENSITIVE Sensitive     RIFAMPIN <=0.5 SENSITIVE Sensitive     Inducible Clindamycin NEGATIVE Sensitive     LINEZOLID 2 SENSITIVE Sensitive     * >=100,000 COLONIES/mL METHICILLIN RESISTANT STAPHYLOCOCCUS AUREUS  Urine Culture (for pregnant, neutropenic or urologic patients or patients with an indwelling urinary catheter)     Status: Abnormal   Collection Time: 12/06/22  5:41 PM   Specimen:  Urine, Clean Catch  Result Value Ref Range Status   Specimen Description   Final    URINE, CLEAN CATCH Performed at [facility], [address], [place], [place] [address]    Special Requests   Final    Normal Performed at [facility], [address] [place], [place] [address]    Culture (A)  Final    70,000 COLONIES/mL ENTEROCOCCUS FAECALIS 70,000 COLONIES/mL YEAST  Report Status 12/09/2022 FINAL  Final   Organism ID, Bacteria ENTEROCOCCUS FAECALIS (A)  Final      Susceptibility   Enterococcus faecalis - MIC*    AMPICILLIN <=2 SENSITIVE Sensitive     NITROFURANTOIN <=16 SENSITIVE Sensitive     VANCOMYCIN 1 SENSITIVE Sensitive     * 70,000 COLONIES/mL ENTEROCOCCUS FAECALIS  Culture, blood (Routine X 2) w Reflex to ID Panel     Status: None   Collection Time: 12/07/22  1:31 PM   Specimen: BLOOD  Result Value Ref Range Status   Specimen Description BLOOD BLOOD RIGHT HAND  Final   Special Requests   Final    BOTTLES DRAWN AEROBIC ONLY Blood Culture adequate volume   Culture   Final    NO GROWTH 5 DAYS Performed at [facility], [address] [place], [place] [address]    Report Status 12/12/2022 FINAL  Final  Culture, blood (Routine X 2) w Reflex to ID Panel     Status: None   Collection Time: 12/07/22  6:41 PM   Specimen: BLOOD  Result Value Ref Range Status   Specimen Description BLOOD BLOOD RIGHT HAND  Final   Special Requests   Final    BOTTLES DRAWN AEROBIC AND ANAEROBIC Blood Culture results may not be optimal due to an inadequate volume of blood received in culture bottles   Culture   Final    NO GROWTH 5 DAYS Performed at [facility], [address] [place], [place] [address]    Report Status 12/12/2022 FINAL  Final  C Difficile Quick Screen (NO PCR Reflex)     Status: None   Collection Time: 12/12/22  4:07 PM   Specimen: STOOL  Result Value Ref Range Status   C Diff antigen NEGATIVE NEGATIVE Final   C Diff toxin  NEGATIVE NEGATIVE Final   C Diff interpretation No C. difficile detected.  Final    Comment: Performed at [facility], [address] [place], [place] [address]  Gastrointestinal Panel by PCR , Stool     Status: None   Collection Time: 12/13/22  8:38 AM   Specimen: Stool  Result Value Ref Range Status   Campylobacter species NOT DETECTED NOT DETECTED Final   Plesimonas shigelloides NOT DETECTED NOT DETECTED Final   Salmonella species NOT DETECTED NOT DETECTED Final   Yersinia enterocolitica NOT DETECTED NOT DETECTED Final   Vibrio species NOT DETECTED NOT DETECTED Final   Vibrio cholerae NOT DETECTED NOT DETECTED Final   Enteroaggregative E coli (EAEC) NOT DETECTED NOT DETECTED Final   Enteropathogenic E coli (EPEC) NOT DETECTED NOT DETECTED Final   Enterotoxigenic E coli (ETEC) NOT DETECTED NOT DETECTED Final   Shiga like toxin producing E coli (STEC) NOT DETECTED NOT DETECTED Final   Shigella/Enteroinvasive E coli (EIEC) NOT DETECTED NOT DETECTED Final   Cryptosporidium NOT DETECTED NOT DETECTED Final   Cyclospora cayetanensis NOT DETECTED NOT DETECTED Final   Entamoeba histolytica NOT DETECTED NOT DETECTED Final   Giardia lamblia NOT DETECTED NOT DETECTED Final   Adenovirus F40/41 NOT DETECTED NOT DETECTED Final   Astrovirus NOT DETECTED NOT DETECTED Final   Norovirus GI/GII NOT DETECTED NOT DETECTED Final   Rotavirus A NOT DETECTED NOT DETECTED Final   Sapovirus (I, II, IV, and V) NOT DETECTED NOT DETECTED Final    Comment: Performed at [facility], [address] [place], [place] [address]    Coagulation Studies: No results for input(s): "LABPROT", "INR" in the  last 72 hours.   Urinalysis: No results for input(s): "COLORURINE", "LABSPEC", "PHURINE", "GLUCOSEU", "HGBUR", "BILIRUBINUR", "KETONESUR", "PROTEINUR", "UROBILINOGEN", "NITRITE", "LEUKOCYTESUR" in the last 72 hours.  Invalid input(s): "APPERANCEUR"     Imaging: No results  found.   Medications:    sodium chloride Stopped (12/15/22 0035)   anticoagulant sodium citrate     DAPTOmycin (CUBICIN) 500 mg in sodium chloride 0.9 % IVPB 500 mg (01/01/23 1718)    amiodarone  200 mg Per Tube BID   ascorbic acid  500 mg Per Tube BID   atorvastatin  40 mg Per Tube Daily   chlorhexidine  15 mL Mouth Rinse BID   Chlorhexidine Gluconate Cloth  6 each Topical Q0600   vitamin D3  2,000 Units Per Tube Daily   cholestyramine  4 g Per Tube TID   copper  2 mg Per Tube Daily   epoetin (EPOGEN/PROCRIT) injection  10,000 Units Intravenous Q T,Th,Sa-HD   escitalopram  10 mg Per Tube Daily   famotidine  10 mg Per Tube Daily   feeding supplement (NEPRO CARB STEADY)  1,120 mL Per Tube Q24H   fludrocortisone  0.2 mg Per Tube Daily   free water  50 mL Per Tube Q4H   heparin injection (subcutaneous)  5,000 Units Subcutaneous Q8H   hydrocortisone cream   Topical TID   hydrOXYzine  100 mg Per Tube BID   insulin aspart  0-6 Units Subcutaneous Q4H   metoCLOPramide  5 mg Oral TID AC   midodrine  20 mg Per Tube TID WC   multivitamin  1 tablet Per Tube QHS   mupirocin ointment   Nasal BID   nutrition supplement (JUVEN)  1 packet Per Tube BID BM   nystatin   Topical TID   phenylephrine  10 mg Per J Tube BID   sodium chloride, acetaminophen **OR** acetaminophen, alteplase, anticoagulant sodium citrate, artificial tears, clonazePAM, diphenhydrAMINE, diphenoxylate-atropine, heparin, heparin, ipratropium-albuterol, lidocaine (PF), lidocaine-prilocaine, loperamide HCl, magnesium hydroxide, ondansetron **OR** ondansetron (ZOFRAN) IV, pentafluoroprop-tetrafluoroeth, traZODone  Assessment/ Plan:  [name] is a 72 y.o.  female with past medical history of diabetes, COPD, GERD, hypertension. OA, a fib, and end stage renal disease on hemodialysis. Patient presents to the ED from her nursing facility complaining of altered mental status. She has been admitted for Lower urinary tract  infectious disease [N39.0] Altered mental status, unspecified altered mental status type [R41.82] Sepsis due to gram-negative UTI (HCC) [A41.50, N39.0] Sepsis, due to unspecified organism, unspecified whether acute organ dysfunction present (HCC) [A41.9]  [facility]/TTS/Rt Permcath/? 81.3kg  End stage renal disease on hemodialysis - Continue TTS schedule -Receiving scheduled dialysis today, UF goal 0.5 to 1 L as tolerated. - Next treatment scheduled for Tuesday. - Patient tolerating treatment in chair, cleared to discharge from renal stance and continue outpatient dialysis.    2. Hypotension due to sepsis. Blood cultures and dialysis cath tip cultures with MRSA, Enterococcus (12/06/2022). Repeat cultures (12/07/2022) with no growth.   - Received pressor support during this admission.   - Continue daptomycin with dialysis.  Recommendations include Daptomycin 500mg  with dialysis until 01/03/23.   - Current regimen of midodrine and droxidopa, blood pressure stable  3. Anemia of chronic kidney disease : PRBC transfusion on 8/26, 9/3  -Hemoglobin 10.1  -EPO given during dialysis.  Will hold next dose.   4. Secondary Hyperparathyroidism:  with hypophosphatemia.   -hold phosphate binders.   - Bone minerals at goal  5.  Hypokalemia  - Corrected  LOS: 28 Alexandra Foster 9/21/202411:13 AM

## 2023-01-03 NOTE — Progress Notes (Signed)
Patient and daughter updated about ETA of [facility] transport at this time.Pt and daughter expressed understanding.[facility] was called via phone and reported Pt is next in line for transport and will be arriving asap.lncoming night shift nurse informed of Pt status and aware dc instructions,education and medications had being reviewed with the patient and daughter respectively.patient stable at this time,no verbal c/o or any ssx of distress.Pt is dc home with self care and outpatient dialysis schedule per order.

## 2023-01-05 ENCOUNTER — Telehealth: Payer: Self-pay | Admitting: Internal Medicine

## 2023-01-05 ENCOUNTER — Telehealth: Payer: Self-pay | Admitting: Family Medicine

## 2023-01-05 NOTE — Telephone Encounter (Signed)
She was in rehab for over a yr but is getting out

## 2023-01-05 NOTE — Telephone Encounter (Unsigned)
Copied from CRM [id number]. Topic: General - Other >> Jan 05, 2023 10:37 AM Epimenio Foot F wrote: Reason for CRM: Arline Asp with [facility] is calling in because pt is requesting to delay to the start of her home health services on 01/07/23. Arline Asp says pt would prefer to start on Wednesday due to her going to dialysis.

## 2023-01-05 NOTE — Telephone Encounter (Signed)
Copied from CRM [id number]. Topic: General - Other >> Jan 05, 2023 10:04 AM Turkey B wrote: Reason for CRM: pt's daughter called in wants to drop off med paperwork for pt

## 2023-01-05 NOTE — Telephone Encounter (Signed)
Copied from CRM [id number]. Topic: Transportation - Transportation >> Jan 05, 2023 10:05 AM Turkey B wrote: Reason for CRM: pt's daughter called in , pt needs transportation for appt 10/30/

## 2023-01-07 ENCOUNTER — Other Ambulatory Visit: Payer: Self-pay | Admitting: Nurse Practitioner

## 2023-01-07 MED ORDER — ACCU-CHEK SOFTCLIX LANCETS MISC: 12 refills | Status: DC

## 2023-01-07 MED ORDER — ACCU-CHEK GUIDE VI STRP: ORAL_STRIP | 12 refills | Status: DC

## 2023-01-07 NOTE — H&P (View-Only) (Signed)
New scripts sent with ICD 10 Z79.4

## 2023-01-07 NOTE — Progress Notes (Signed)
New scripts sent with ICD 10 Z79.4

## 2023-01-08 ENCOUNTER — Telehealth: Payer: Self-pay | Admitting: Family Medicine

## 2023-01-08 NOTE — Telephone Encounter (Signed)
Home Health Verbal Orders - Caller/Agency: Jatana from [facility]  Callback Number: [phone] Requesting PT Frequency: 1x1 2x4 1x4

## 2023-01-08 NOTE — Telephone Encounter (Signed)
Verbal orders given  

## 2023-01-09 ENCOUNTER — Other Ambulatory Visit: Payer: Self-pay | Admitting: Internal Medicine

## 2023-01-09 MED ORDER — BLOOD GLUCOSE TEST VI STRP
1.0000 | ORAL_STRIP | Freq: Three times a day (TID) | 12 refills | Status: DC
Start: 2023-01-09 — End: 2023-01-20

## 2023-01-09 MED ORDER — ACCU-CHEK SOFTCLIX LANCETS MISC
1.0000 | Freq: Three times a day (TID) | 12 refills | Status: DC
Start: 2023-01-09 — End: 2023-01-20

## 2023-01-09 NOTE — Progress Notes (Signed)
E-script sent for lancets and strips to [facility] [address] in [place] with E11.22 ICD 10 included free text in indication

## 2023-01-14 ENCOUNTER — Telehealth: Payer: Self-pay | Admitting: Family Medicine

## 2023-01-14 NOTE — Telephone Encounter (Signed)
Home Health Verbal Orders - Caller/Agency: Darral Dash from [facility] Callback Number: [phone] Requesting OT, ws supposed to be last week,[facility] are person was on vacation, so she is requesting urgent approval today to se pt Frequency: evalauation

## 2023-01-14 NOTE — Telephone Encounter (Signed)
Verbal orders given  

## 2023-01-15 ENCOUNTER — Telehealth: Payer: Self-pay | Admitting: Family Medicine

## 2023-01-15 ENCOUNTER — Telehealth: Payer: Self-pay | Admitting: *Deleted

## 2023-01-15 DIAGNOSIS — I10 Essential (primary) hypertension: Secondary | ICD-10-CM

## 2023-01-15 NOTE — Patient Outreach (Signed)
Care Coordination   Collaboration   Visit Note   01/15/2023 Name: Eller Gotts MRN: [id number] DOB: 05-23-1950  Alexandra Foster is a 72 y.o. year old female who sees Shackleford, Lovell Sheehan, MD for primary care.   What matters to the patients health and wellness today?  Transportation to PCP on 02/11/23.    Goals Addressed             This Visit's Progress    care coordination activities       Interventions Today    Flowsheet Row Most Recent Value  General Interventions   General Interventions Discussed/Reviewed Communication with  Communication with --  [Request received for assistance with transportation-Careguide contacted, referral completed for transportation resources to medical appointments]              SDOH assessments and interventions completed:  No     Care Coordination Interventions:  Yes, provided   Follow up plan:  Patient referred to [facility] for transportation resources to medical appointments    Encounter Outcome:  Patient Visit Completed

## 2023-01-15 NOTE — Telephone Encounter (Signed)
Home Health Verbal Orders - Caller/Agency: Darral Dash / [facility] Callback Number: [phone] Requesting OT Frequency: 1 wk 1 2 wk 3  Esther also requesting speech eval for swallowing

## 2023-01-16 ENCOUNTER — Telehealth: Payer: Self-pay | Admitting: *Deleted

## 2023-01-16 NOTE — Telephone Encounter (Signed)
Telephone encounter was:  Unsuccessful.  01/16/2023 Name: Alexandra Foster MRN: [id number] DOB: 1950/05/30  Unsuccessful outbound call made today to assist with:  Transportation Needs   Outreach Attempt:  1st Attempt  A HIPAA compliant voice message was left requesting a return call.  Instructed patient to call back at [phone].  Dione Booze [facility]  Population Health Careguide  Direct Dial: [phone] Website: [email or web address]

## 2023-01-16 NOTE — Telephone Encounter (Signed)
Completed.

## 2023-01-19 ENCOUNTER — Telehealth: Payer: Self-pay | Admitting: Family Medicine

## 2023-01-19 ENCOUNTER — Other Ambulatory Visit: Payer: Self-pay

## 2023-01-19 ENCOUNTER — Telehealth: Payer: Self-pay | Admitting: *Deleted

## 2023-01-19 NOTE — Telephone Encounter (Signed)
Telephone encounter was:  Unsuccessful.  01/19/2023 Name: Alesi Kroft MRN: [id number] DOB: 03-Jan-1951  Unsuccessful outbound call made today to assist with:  Transportation Needs   Outreach Attempt:  2nd Attempt  Mobile number not in service   Sandhya Denherder [facility]  Population Health Careguide  Direct Dial: [phone] Website: [email or web address]

## 2023-01-19 NOTE — Telephone Encounter (Addendum)
Patients daughter has called back to check on this and states PCP listed, Dr Marcene Brawn was paitents PCP while she was in the hospital. Patients daughter states patient was almost in hospital for 2 years. Patient was last seen by Dr Carlynn Purl on 1.17.23. Please advise where to schedule patient for HFU if she needs to be sooner than her HFU scheduled for 10.30.24 and contact [name], daughter, back @ # [phone]. Patient daughter states she is frustrated going back and forth with this and wants this figured out for the patient. [name] states she has dialysis on Tuesday and Thursdays, she needs to be seen on Wednesday. [name] states she is out of medication completely and needs an appt for a refill. [name] states she will need to know ahead of time for transportation purposes as well.

## 2023-01-19 NOTE — Telephone Encounter (Signed)
No provider have anything available for the next few weeks. Please advise as the pt is completely out of her meds. The provider on her chart is from the rehab place she was staying.

## 2023-01-19 NOTE — Telephone Encounter (Signed)
Pts daughter is calling to report that she has been waiting for a HFU appt that is 02/11/23. Pt is completely out of her medications. Has reached out to the pharmacy Daughter would like something to be done today. Please advise [facility]- [phone]

## 2023-01-19 NOTE — Telephone Encounter (Signed)
Alexandra Foster Bellanger is calling in checking on the status of an appointment for pt. Alexandra Foster says she cannot wait until the end of the month to be seen because she is out of a lot of her medications. Alexandra Foster says she can do a virtual appointment if need be, but waiting to the 30th isn't ideal. Alexandra Foster is concerned that her mother will get sicker without medication as pt has been out of medication for 3 weeks. Alexandra Foster says she needs something done by Wednesday. Please follow up with Alexandra Foster.

## 2023-01-20 ENCOUNTER — Telehealth: Payer: Self-pay

## 2023-01-20 ENCOUNTER — Other Ambulatory Visit: Payer: Self-pay

## 2023-01-20 DIAGNOSIS — I6529 Occlusion and stenosis of unspecified carotid artery: Secondary | ICD-10-CM

## 2023-01-20 DIAGNOSIS — E785 Hyperlipidemia, unspecified: Secondary | ICD-10-CM

## 2023-01-20 DIAGNOSIS — I4891 Unspecified atrial fibrillation: Secondary | ICD-10-CM

## 2023-01-20 DIAGNOSIS — F419 Anxiety disorder, unspecified: Secondary | ICD-10-CM

## 2023-01-20 DIAGNOSIS — E1169 Type 2 diabetes mellitus with other specified complication: Secondary | ICD-10-CM

## 2023-01-20 DIAGNOSIS — K219 Gastro-esophageal reflux disease without esophagitis: Secondary | ICD-10-CM

## 2023-01-20 MED ORDER — CHOLESTYRAMINE 4 G PO PACK
4.0000 g | PACK | Freq: Three times a day (TID) | ORAL | 0 refills | Status: DC
Start: 2023-01-20 — End: 2023-02-11

## 2023-01-20 MED ORDER — HYDROXYZINE HCL 50 MG PO TABS
50.0000 mg | ORAL_TABLET | Freq: Two times a day (BID) | ORAL | 0 refills | Status: DC
Start: 2023-01-20 — End: 2023-02-04

## 2023-01-20 MED ORDER — AMIODARONE HCL 200 MG PO TABS
200.0000 mg | ORAL_TABLET | Freq: Two times a day (BID) | ORAL | 0 refills | Status: DC
Start: 2023-01-20 — End: 2023-02-12

## 2023-01-20 MED ORDER — METOCLOPRAMIDE HCL 5 MG PO TABS
5.0000 mg | ORAL_TABLET | Freq: Three times a day (TID) | ORAL | 0 refills | Status: DC
Start: 2023-01-20 — End: 2023-02-04

## 2023-01-20 MED ORDER — BLOOD GLUCOSE MONITORING SUPPL DEVI
1.0000 | Freq: Three times a day (TID) | 0 refills | Status: DC
Start: 2023-01-20 — End: 2023-02-14

## 2023-01-20 MED ORDER — ACCU-CHEK SOFTCLIX LANCETS MISC: 1.0000 | Freq: Three times a day (TID) | 12 refills | Status: DC

## 2023-01-20 MED ORDER — BLOOD GLUCOSE TEST VI STRP: 1.0000 | ORAL_STRIP | Freq: Three times a day (TID) | 0 refills | Status: DC

## 2023-01-20 MED ORDER — ESCITALOPRAM OXALATE 10 MG PO TABS
10.0000 mg | ORAL_TABLET | Freq: Every day | ORAL | 0 refills | Status: DC
Start: 2023-01-20 — End: 2023-02-11

## 2023-01-20 MED ORDER — ATORVASTATIN CALCIUM 40 MG PO TABS
40.0000 mg | ORAL_TABLET | Freq: Every day | ORAL | 0 refills | Status: DC
Start: 2023-01-20 — End: 2023-02-12

## 2023-01-20 NOTE — Telephone Encounter (Signed)
[name] [name], presented to clinic for a medication reconciliation/review per Dr. Carlynn Purl' recommendation in order to make sure patient is prescribed enough medication to make it through until her follow up appointment. Medications tee'd up and Dr. Carlynn Purl updated on status.

## 2023-01-20 NOTE — Telephone Encounter (Signed)
Spoke with patient's daughter and went over the options for getting medications per Dr. Carlynn Purl. She stated she will try to gather all prescription bottles and bring them in for reconciliation and 2 week refill to get Alexandra Foster through until her appt or one comes available sooner. Office hours given and she stated she would try to come in today.

## 2023-01-30 ENCOUNTER — Telehealth (INDEPENDENT_AMBULATORY_CARE_PROVIDER_SITE_OTHER): Payer: Self-pay

## 2023-01-30 NOTE — Telephone Encounter (Signed)
A fax was received from [place] at [facility]. An order to do a permcath exchange was sent. Patient scheduled with Dr. Wyn Quaker on 02/02/23 with a 10:30 am arrival time to the [facility]. Pre-procedure instructions will be faxed to [facility] per request.

## 2023-02-02 ENCOUNTER — Telehealth (INDEPENDENT_AMBULATORY_CARE_PROVIDER_SITE_OTHER): Payer: Self-pay

## 2023-02-02 ENCOUNTER — Telehealth: Payer: Self-pay | Admitting: Family Medicine

## 2023-02-02 DIAGNOSIS — N186 End stage renal disease: Secondary | ICD-10-CM

## 2023-02-02 NOTE — Telephone Encounter (Signed)
Pt's daughter [name] is calling in because she said she is running out of medications and was told by Dr. Carlynn Purl to call in when the meds are getting low. [name] did not specify the medications that were needed. Please follow up with [name].

## 2023-02-02 NOTE — Telephone Encounter (Signed)
[name] called stating she was not coming to her permcath exchange today as she was given the information this morning from [facility]. The daughter stated that someone at [facility] stated that she was to get a call from [place]. Per the fax that was sent in, [facility] requested an appt to have a permcath exchange and to fax the appt back to [facility]. Appt was faxed back to them. Patient has been rescheduled to 02/04/23 with a 12:00 pm arrival time to the [facility]. [name] stated if transportation can be arranged then she will be brought in. She also stated the patient was allergic to contrast dye and in patient's chart there are no allergies listed at all. The daughter stated she will make Specials aware of this.

## 2023-02-03 ENCOUNTER — Telehealth: Payer: Self-pay

## 2023-02-03 ENCOUNTER — Inpatient Hospital Stay: Payer: Medicare Other | Admitting: Family Medicine

## 2023-02-03 NOTE — Telephone Encounter (Signed)
Left voicemail to let daughter know medications should last until visit and to bring in all medication bottles per Dr. Carlynn Purl' advice to appointment.

## 2023-02-04 ENCOUNTER — Encounter
Admission: RE | Disposition: A | Discharge status: Home / Self Care | Payer: Self-pay | Source: Home / Self Care | Attending: Vascular Surgery

## 2023-02-04 ENCOUNTER — Other Ambulatory Visit: Payer: Self-pay

## 2023-02-04 ENCOUNTER — Ambulatory Visit: Payer: Self-pay | Admitting: *Deleted

## 2023-02-04 ENCOUNTER — Ambulatory Visit
Admission: RE | Admit: 2023-02-04 | Discharge: 2023-02-04 | Disposition: A | Discharge status: Home / Self Care | Payer: Medicare Other | Attending: Vascular Surgery | Admitting: Vascular Surgery

## 2023-02-04 ENCOUNTER — Encounter: Payer: Self-pay | Admitting: Vascular Surgery

## 2023-02-04 DIAGNOSIS — E785 Hyperlipidemia, unspecified: Secondary | ICD-10-CM

## 2023-02-04 DIAGNOSIS — Y839 Surgical procedure, unspecified as the cause of abnormal reaction of the patient, or of later complication, without mention of misadventure at the time of the procedure: Secondary | ICD-10-CM | POA: Insufficient documentation

## 2023-02-04 DIAGNOSIS — F32A Depression, unspecified: Secondary | ICD-10-CM

## 2023-02-04 DIAGNOSIS — Z992 Dependence on renal dialysis: Secondary | ICD-10-CM | POA: Insufficient documentation

## 2023-02-04 DIAGNOSIS — I6529 Occlusion and stenosis of unspecified carotid artery: Secondary | ICD-10-CM

## 2023-02-04 DIAGNOSIS — N186 End stage renal disease: Secondary | ICD-10-CM | POA: Insufficient documentation

## 2023-02-04 DIAGNOSIS — T8249XA Other complication of vascular dialysis catheter, initial encounter: Secondary | ICD-10-CM | POA: Insufficient documentation

## 2023-02-04 DIAGNOSIS — K219 Gastro-esophageal reflux disease without esophagitis: Secondary | ICD-10-CM

## 2023-02-04 HISTORY — PX: DIALYSIS/PERMA CATHETER INSERTION: CATH118288

## 2023-02-04 LAB — POTASSIUM (ARMC VASCULAR LAB ONLY): Potassium (ARMC vascular lab): 4.5 mmol/L (ref 3.5–5.1)

## 2023-02-04 LAB — GLUCOSE, CAPILLARY: Glucose-Capillary: 121 mg/dL — ABNORMAL HIGH (ref 70–99)

## 2023-02-04 SURGERY — DIALYSIS/PERMA CATHETER INSERTION
Anesthesia: Moderate Sedation

## 2023-02-04 MED ORDER — DIPHENHYDRAMINE HCL 50 MG/ML IJ SOLN: 50.0000 mg | Freq: Once | INTRAMUSCULAR | Status: DC | PRN

## 2023-02-04 MED ORDER — HEPARIN SODIUM (PORCINE) 10000 UNIT/ML IJ SOLN
INTRAMUSCULAR | Status: AC
  Filled 2023-02-04: qty 1

## 2023-02-04 MED ORDER — METOCLOPRAMIDE HCL 5 MG PO TABS: 5.0000 mg | ORAL_TABLET | Freq: Three times a day (TID) | ORAL | 0 refills | Status: DC

## 2023-02-04 MED ORDER — MIDAZOLAM HCL 2 MG/ML PO SYRP: 8.0000 mg | ORAL_SOLUTION | Freq: Once | ORAL | Status: DC | PRN

## 2023-02-04 MED ORDER — FENTANYL CITRATE (PF) 100 MCG/2ML IJ SOLN
INTRAMUSCULAR | Status: DC | PRN
  Administered 2023-02-04: 25 ug via INTRAVENOUS

## 2023-02-04 MED ORDER — MIDODRINE HCL 10 MG PO TABS: 20.0000 mg | ORAL_TABLET | Freq: Three times a day (TID) | ORAL | 0 refills | Status: DC

## 2023-02-04 MED ORDER — HEPARIN SODIUM (PORCINE) 10000 UNIT/ML IJ SOLN
INTRAMUSCULAR | Status: DC | PRN
  Administered 2023-02-04: 10000 [IU]

## 2023-02-04 MED ORDER — SODIUM CHLORIDE 0.9 % IV SOLN: INTRAVENOUS | Status: DC

## 2023-02-04 MED ORDER — CLONAZEPAM 0.5 MG PO TABS: 0.5000 mg | ORAL_TABLET | Freq: Two times a day (BID) | ORAL | 0 refills | Status: DC | PRN

## 2023-02-04 MED ORDER — HYDROMORPHONE HCL 1 MG/ML IJ SOLN: 1.0000 mg | Freq: Once | INTRAMUSCULAR | Status: DC | PRN

## 2023-02-04 MED ORDER — CEFAZOLIN SODIUM-DEXTROSE 1-4 GM/50ML-% IV SOLN
1.0000 g | INTRAVENOUS | Status: AC
  Administered 2023-02-04: 1 g via INTRAVENOUS

## 2023-02-04 MED ORDER — CEFAZOLIN SODIUM-DEXTROSE 1-4 GM/50ML-% IV SOLN
INTRAVENOUS | Status: AC
  Filled 2023-02-04: qty 50

## 2023-02-04 MED ORDER — FENTANYL CITRATE (PF) 100 MCG/2ML IJ SOLN
INTRAMUSCULAR | Status: AC
  Filled 2023-02-04: qty 2

## 2023-02-04 MED ORDER — RENA-VITE PO TABS: 1.0000 | ORAL_TABLET | Freq: Every day | ORAL | 0 refills | Status: DC

## 2023-02-04 MED ORDER — METHYLPREDNISOLONE SODIUM SUCC 125 MG IJ SOLR: 125.0000 mg | Freq: Once | INTRAMUSCULAR | Status: DC | PRN

## 2023-02-04 MED ORDER — HYDROXYZINE HCL 50 MG PO TABS: 50.0000 mg | ORAL_TABLET | Freq: Two times a day (BID) | ORAL | 0 refills | Status: DC

## 2023-02-04 MED ORDER — FAMOTIDINE 20 MG PO TABS: 40.0000 mg | ORAL_TABLET | Freq: Once | ORAL | Status: DC | PRN

## 2023-02-04 MED ORDER — MIDAZOLAM HCL 2 MG/2ML IJ SOLN
INTRAMUSCULAR | Status: DC | PRN
  Administered 2023-02-04: 1 mg via INTRAVENOUS

## 2023-02-04 MED ORDER — ONDANSETRON HCL 4 MG/2ML IJ SOLN: 4.0000 mg | Freq: Four times a day (QID) | INTRAMUSCULAR | Status: DC | PRN

## 2023-02-04 MED ORDER — MIDAZOLAM HCL 5 MG/5ML IJ SOLN
INTRAMUSCULAR | Status: AC
  Filled 2023-02-04: qty 5

## 2023-02-04 MED ORDER — LIDOCAINE-EPINEPHRINE (PF) 1 %-1:200000 IJ SOLN
INTRAMUSCULAR | Status: DC | PRN
  Administered 2023-02-04: 20 mL

## 2023-02-04 MED ORDER — HEPARIN (PORCINE) IN NACL 1000-0.9 UT/500ML-% IV SOLN
INTRAVENOUS | Status: DC | PRN
  Administered 2023-02-04: 500 mL

## 2023-02-04 SURGICAL SUPPLY — 6 items
BIOPATCH RED 1 DISK 7.0 (GAUZE/BANDAGES/DRESSINGS) IMPLANT
CATH PALINDROME-SP 14.5FX23 (CATHETERS) IMPLANT
GUIDEWIRE SUPER STIFF .035X180 (WIRE) IMPLANT
PACK ANGIOGRAPHY (CUSTOM PROCEDURE TRAY) IMPLANT
SUT MNCRL AB 4-0 PS2 18 (SUTURE) IMPLANT
SUT PROLENE 0 CT 1 30 (SUTURE) IMPLANT

## 2023-02-04 NOTE — Op Note (Signed)
OPERATIVE NOTE    PRE-OPERATIVE DIAGNOSIS: 1. ESRD 2. Non-functional permcath  POST-OPERATIVE DIAGNOSIS: same as above  PROCEDURE: Fluoroscopic guidance for placement of catheter Placement of a 23 cm tip to cuff tunneled hemodialysis catheter via the left internal jugular vein and removal of previous catheter  SURGEON: Festus Barren, MD  ANESTHESIA:  Local with moderate conscious sedation for 13 minutes using 1 mg of Versed and 25 mcg of Fentanyl  ESTIMATED BLOOD LOSS: 3 cc  FINDING(S): none  SPECIMEN(S):  None  INDICATIONS:   Patient is a 72 y.o.female who presents with non-functional dialysis catheter and ESRD.  The patient needs long term dialysis access for their ESRD, and a Permcath is necessary.  Risks and benefits are discussed and informed consent is obtained.    DESCRIPTION: After obtaining full informed written consent, the patient was brought back to the vascular suite. The patient received moderate conscious sedation during a face-to-face encounter with me present throughout the entire procedure and supervising the RN monitoring the vital signs, pulse oximetry, telemetry, and mental status throughout the entire procedure. The patient's existing catheter, left neck and chest were sterilely prepped and draped in a sterile surgical field was created.  The existing catheter was dissected free from the fibrous sheath securing the cuff with hemostats and blunt dissection.  A wire was placed. The existing catheter was then removed and the wire used to keep venous access. I selected a 23 cm tip to cuff tunneled dialysis catheter.  Using fluoroscopic guidance the catheter tips were parked in the right atrium. The appropriate distal connectors were placed. It withdrew blood well and flushed easily with heparinized saline and a concentrated heparin solution was then placed. It was secured to the chest wall with 2 Prolene sutures. A 4-0 Monocryl pursestring suture was placed around the exit  site. Sterile dressings were placed. The patient tolerated the procedure well and was taken to the recovery room in stable condition.  COMPLICATIONS: None  CONDITION: Stable  Festus Barren 02/04/2023 1:29 PM   This note was created with Dragon Medical transcription system. Any errors in dictation are purely unintentional.

## 2023-02-04 NOTE — Discharge Instructions (Signed)
Tunneled Catheter Insertion, Care After The following information offers guidance on how to care for yourself after your procedure. Your health care provider may also give you more specific instructions. If you have problems or questions, contact your health care provider. What can I expect after the procedure? After the procedure, it is common to have: Some mild redness, bruising, swelling, and pain around your catheter site. A small amount of blood or clear fluid coming from your incisions. Follow these instructions at home: Medicines Take over-the-counter and prescription medicines only as told by your health care provider. If you were prescribed an antibiotic medicine, take it as told by your health care provider. Do not stop taking the antibiotic even if you start to feel better. Incision care  Follow instructions from your health care provider about how to take care of your incisions. Make sure you: The dialysis center will change the dressing that covers your perm cath for dialysis. The skin glue on the site will gradually wear off, do not attempt to peel them off.  You need to avoid getting the area of your perm cath catheter clean and dry, so avoid showers, swimming, etc.  Check your incision areas every day for signs of infection.    Check for: More redness, swelling, or pain. More fluid or blood. Warmth. Pus or a bad smell. Catheter care  Keep your catheter site clean and dry. Leave thecaps on the ends of the catheter when not in use. Do not pull on your catheter. Activity Return to your normal activities as told by your health care provider. Ask your health care provider what activities are safe for you. Follow any other activity restrictions as instructed by your health care provider. Do not lift anything that is heavier than 10 lb (4.5 kg), or the limit that you are told, until your health care provider says that it is safe. Driving Do not drive for [id number] Ask your  health care provider if the medicine prescribed to you requires you to avoid driving or using machinery. General instructions Follow your health care provider's specific instructions for the type of catheter that you have. Do not take baths, swim, or use a hot tub until your health care provider approves. Ask your health care provider if you may take showers. Keep all follow-up visits. This is important. Contact a health care provider if: You feel unusually weak or nauseous. You have a fever or chills. You have more redness, swelling, or pain at your incisions or around the area where your catheter has been inserted or where it exits. You have pus or a bad smell coming from your catheter site. Your catheter site feels warm to the touch. Your catheter is not working properly. Fluid is leaking from the catheter, under the dressing, or around the dressing. You are unable to flush your catheter. Get help right away if: Your catheter develops a hole or it breaks. Your catheter comes loose or gets pulled completely out. If this happens, press on your catheter site firmly with a clean cloth until you can get medical help. You develop bleeding from your catheter or your insertion site, and your bleeding does not stop. You have swelling in your shoulder, neck, chest, or face. You have pain or swelling when fluids or medicines are being given through the catheter. You have chest pain or difficulty breathing. These symptoms may represent a serious problem that is an emergency. Do not wait to see if the symptoms will go away. Get  medical help right away. Call your local emergency services (911 in the [place] Do not drive yourself to the hospital. Summary After the procedure, it is common to have mild redness, swelling, and pain around your catheter site. Return to your normal activities as told by your health care provider. Ask your health care provider what activities are safe for you. Follow your  health care provider's specific instructions for the type of catheter that you have. Keep your catheter site and your dressings clean and dry. Contact a health care provider if your catheter is not working properly. Get help right away if you have chest pain, difficulty breathing, or your catheter comes loose or gets pulled completely out. This information is not intended to replace advice given to you by your health care provider. Make sure you discuss any questions you have with your health care provider. Document Revised: 08/06/2020 Document Reviewed: 08/06/2020 Elsevier Patient Education  2023 Elsevier Inc.     Tunneled Catheter Insertion, Care After The following information offers guidance on how to care for yourself after your procedure. Your health care provider may also give you more specific instructions. If you have problems or questions, contact your health care provider. What can I expect after the procedure? After the procedure, it is common to have: Some mild redness, bruising, swelling, and pain around your catheter site. A small amount of blood or clear fluid coming from your incisions. Follow these instructions at home: Medicines Take over-the-counter and prescription medicines only as told by your health care provider. If you were prescribed an antibiotic medicine, take it as told by your health care provider. Do not stop taking the antibiotic even if you start to feel better. Incision care  Follow instructions from your health care provider about how to take care of your incisions. Make sure you: The dialysis center will change the dressing that covers your perm cath for dialysis. The skin glue on the site will gradually wear off, do not attempt to peel them off.  You need to avoid getting the area of your perm cath catheter clean and dry, so avoid showers, swimming, etc.  Check your incision areas every day for signs of infection.    Check for: More redness, swelling,  or pain. More fluid or blood. Warmth. Pus or a bad smell. Catheter care  Keep your catheter site clean and dry. Leave thecaps on the ends of the catheter when not in use. Do not pull on your catheter. Activity Return to your normal activities as told by your health care provider. Ask your health care provider what activities are safe for you. Follow any other activity restrictions as instructed by your health care provider. Do not lift anything that is heavier than 10 lb (4.5 kg), or the limit that you are told, until your health care provider says that it is safe. Driving Do not drive for 27CWCBJ Ask your health care provider if the medicine prescribed to you requires you to avoid driving or using machinery. General instructions Follow your health care provider's specific instructions for the type of catheter that you have. Do not take baths, swim, or use a hot tub until your health care provider approves. Ask your health care provider if you may take showers. Keep all follow-up visits. This is important. Contact a health care provider if: You feel unusually weak or nauseous. You have a fever or chills. You have more redness, swelling, or pain at your incisions or around the area where your  catheter has been inserted or where it exits. You have pus or a bad smell coming from your catheter site. Your catheter site feels warm to the touch. Your catheter is not working properly. Fluid is leaking from the catheter, under the dressing, or around the dressing. You are unable to flush your catheter. Get help right away if: Your catheter develops a hole or it breaks. Your catheter comes loose or gets pulled completely out. If this happens, press on your catheter site firmly with a clean cloth until you can get medical help. You develop bleeding from your catheter or your insertion site, and your bleeding does not stop. You have swelling in your shoulder, neck, chest, or face. You have pain  or swelling when fluids or medicines are being given through the catheter. You have chest pain or difficulty breathing. These symptoms may represent a serious problem that is an emergency. Do not wait to see if the symptoms will go away. Get medical help right away. Call your local emergency services (911 in the [place] Do not drive yourself to the hospital. Summary After the procedure, it is common to have mild redness, swelling, and pain around your catheter site. Return to your normal activities as told by your health care provider. Ask your health care provider what activities are safe for you. Follow your health care provider's specific instructions for the type of catheter that you have. Keep your catheter site and your dressings clean and dry. Contact a health care provider if your catheter is not working properly. Get help right away if you have chest pain, difficulty breathing, or your catheter comes loose or gets pulled completely out. This information is not intended to replace advice given to you by your health care provider. Make sure you discuss any questions you have with your health care provider. Document Revised: 08/06/2020 Document Reviewed: 08/06/2020 Elsevier Patient Education  2023 [employer].

## 2023-02-04 NOTE — Telephone Encounter (Signed)
Pt's daughter called in , I relayed message to her from [place], but she states that pt  is out of her BP med

## 2023-02-04 NOTE — Interval H&P Note (Signed)
History and Physical Interval Note:  02/04/2023 12:36 PM  Alexandra Foster  has presented today for surgery, with the diagnosis of Perma Cath Exchange    End Stage Renal.  The various methods of treatment have been discussed with the patient and family. After consideration of risks, benefits and other options for treatment, the patient has consented to  Procedure(s): DIALYSIS/PERMA CATHETER INSERTION (N/A) as a surgical intervention.  The patient's history has been reviewed, patient examined, no change in status, stable for surgery.  I have reviewed the patient's chart and labs.  Questions were answered to the patient's satisfaction.     Festus Barren

## 2023-02-04 NOTE — Telephone Encounter (Signed)
Spoke to daughter and got the information for medications/tee'd up and sent to Dr. Carlynn Purl.

## 2023-02-04 NOTE — Telephone Encounter (Signed)
  Chief Complaint: Daughter Earley Abide needing clarification on the Hydroxyzine 50 mg. Symptoms: Her medication list is indicating 1 tablet twice a day (50 mg each) but on this list from the hospital it's 2 tablets twice a day morning and night which is what I have been giving her.    When was it changed to 1 tablet twice a day?   She's been on 2 tablets twice a day for a while for her itching and anxiety. Frequency: N/A Pertinent Negatives: Patient denies knowing when it was changed. Disposition: [] ED /[] Urgent Care (no appt availability in office) / [] Appointment(In office/virtual)/ []  [facility]/ [] Home Care/ [] Refused Recommended Disposition /[] [place] [facility]/ [x]  Follow-up with PCP Additional Notes: Message sent to Dr. Carlynn Purl   Pt agreeable to someone calling her back.

## 2023-02-04 NOTE — Telephone Encounter (Signed)
Message from Phill Myron sent at 02/04/2023  3:39 PM EDT  Summary: Questioning medication dosage   Daughter calling about mother's medication.  She is  questioning the medication dosage change:   hydrOXYzine (ATARAX) 50 MG tablet  ................  it should be 2 tabs twice a day          Call History  Contact Date/Time Type Contact Phone/Fax User  02/04/2023 03:34 PM EDT Phone (Incoming) Bellanger,Latresa (Emergency Contact) [phone] Judie Petit) [place] [address]   Reason for Disposition  [1] Caller has URGENT medicine question about med that PCP or specialist prescribed AND [2] triager unable to answer question  Answer Assessment - Initial Assessment Questions 1. NAME of MEDICINE: "What medicine(s) are you calling about?"     Hydroxyzine 50 mg.    2. QUESTION: "What is your question?" (e.g., double dose of medicine, side effect)     She was sent home with a list of her medications after having a hemodialysis inserted today.  I normally give her 2 tablets two times a day, morning and night.   This paper is saying that but her medication list is saying 1 tablet twice a day.   I'm just wondering when it got changed.    Can Dr. Carlynn Purl clarify this?    She really needs this medication.   It's for her itching and anxiety which she really needs.  3. PRESCRIBER: "Who prescribed the medicine?" Reason: if prescribed by specialist, call should be referred to that group.     Drl Sowles 4. SYMPTOMS: "Do you have any symptoms?" If Yes, ask: "What symptoms are you having?"  "How bad are the symptoms (e.g., mild, moderate, severe)    N/A 5. PREGNANCY:  "Is there any chance that you are pregnant?" "When was your last menstrual period?"     N/A  Protocols used: Medication Question Call-A-AH

## 2023-02-04 NOTE — Telephone Encounter (Addendum)
Patients daughter has called back and advised of information below from [place]. Per patient's daughter, patient's medication will not last until visit, that patient will be running out today of certain medications, did not specify which ones. [name] would like a call back today, as soon in possible in regards to this @  #  [phone].

## 2023-02-05 ENCOUNTER — Encounter: Payer: Self-pay | Admitting: Vascular Surgery

## 2023-02-05 ENCOUNTER — Other Ambulatory Visit: Payer: Self-pay | Admitting: Family Medicine

## 2023-02-05 ENCOUNTER — Other Ambulatory Visit: Payer: Self-pay

## 2023-02-05 DIAGNOSIS — F419 Anxiety disorder, unspecified: Secondary | ICD-10-CM

## 2023-02-05 DIAGNOSIS — F32A Depression, unspecified: Secondary | ICD-10-CM

## 2023-02-05 MED ORDER — HYDROXYZINE HCL 50 MG PO TABS: 100.0000 mg | ORAL_TABLET | Freq: Two times a day (BID) | ORAL | 0 refills | Status: DC

## 2023-02-05 NOTE — Telephone Encounter (Signed)
Pt called in regarding update of refill of clonazePAM . Please cb

## 2023-02-06 ENCOUNTER — Telehealth: Payer: Self-pay | Admitting: Family Medicine

## 2023-02-06 NOTE — Telephone Encounter (Signed)
Danni with [facility] states that the pt had a missed appt this week due to dialysis and pt forgot about her Skilled Nursing appt with [facility]. Per Alger Simons pt is scheduled to be seen twice a week and when there is a missed appt, she has to report it to pt PCP.    Danni Phone number: [phone]

## 2023-02-09 ENCOUNTER — Telehealth: Payer: Self-pay | Admitting: Family Medicine

## 2023-02-09 ENCOUNTER — Ambulatory Visit: Payer: Self-pay | Admitting: *Deleted

## 2023-02-09 NOTE — Telephone Encounter (Signed)
  Chief Complaint: stage 2 wounds per [facility] and daughter on DPR Symptoms: open wound dime size under right breast and right abdominal area. Cleaned with soap and water patted dry and gauze .  Frequency: na  Pertinent Negatives: Patient denies drainage no fever  Disposition: [] ED /[] Urgent Care (no appt availability in office) / [x] Appointment(In office/virtual)/ []  [facility]/ [] Home Care/ [] Refused Recommended Disposition /[] [place] [facility]/ []  Follow-up with PCP Additional Notes:   Appt already scheduled for 02/11/23. Do you want to prescribe wound care prior to OV? Recommended for daughter to continue treatment of washing areas with soap and water and pat dry . Daughter requesting any medications to be sent to [facility].   Summary: home health call   Dani from [facility] called sttd during her visit she noticed 2 addtnl dime size wounds, the first wound is under the right breast that is stage 2 and the other is inguinal right side stage 2 also. For additional questions please contact Dani at [phone] as she stated she is treating the wounds.               Reason for Disposition  Wound infection suspected by triager (e.g., other signs of wound infection)  Answer Assessment - Initial Assessment Questions 1. LOCATION: "Where is the wound located?"      Under right breast and right abdominal area 2. WOUND APPEARANCE: "What does the wound look like?"      Stage 2 per notes of Dani from [facility] and patient's daughter on DPR  3. SIZE: If redness is present, ask: "What is the size of the red area?" (Inches, centimeters, or compare to size of a coin)      Na  4. SPREAD: "What's changed in the last day?"  "Do you see any red streaks coming from the wound?"     Na  5. ONSET: "When did it start to look infected?"      Na  6. MECHANISM: "How did the wound start, what was the cause?"     na 7. PAIN: Do you have any pain?"  If Yes, ask:  "How bad is the pain?"  (e.g., Scale 1-10; mild, moderate, or severe)    - MILD (1-3): Doesn't interfere with normal activities.     - MODERATE (4-7): Interferes with normal activities or awakens from sleep.    - SEVERE (8-10): Excruciating pain, unable to do any normal activities.       na 8. FEVER: "Do you have a fever?" If Yes, ask: "What is your temperature, how was it measured, and when did it start?"     na 9. OTHER SYMPTOMS: "Do you have any other symptoms?" (e.g., shaking chills, weakness, rash elsewhere on body)     Open areas dime size  10. PREGNANCY: "Is there any chance you are pregnant?" "When was your last menstrual period?"       na  Protocols used: Wound Infection Suspected-A-AH

## 2023-02-09 NOTE — Telephone Encounter (Signed)
Dani called from [facility] stated patient has 2 dime size wounds located under her right breast stage 2 and inguinal right side stage 2 also. Valentino Saxon is requesting new wound supplies. Supplies needed are Optifoam gentle light dressing and Silver Alginate and skin prep. Please f/u with Dani for additional questions.

## 2023-02-09 NOTE — Telephone Encounter (Signed)
Darral Dash, OT w/ [facility] calling to move pt's appt for reassessment from last week (last Friday) to today.  Because of dialysis changes. Cb  [phone]  Darral Dash asked to make this urgent.

## 2023-02-10 NOTE — H&P (Signed)
[facility] Admission History & Physical  MRN : [id number]  Alexandra Foster is a 72 y.o. (Mar 12, 1951) female who presents with chief complaint of No chief complaint on file. [name]  History of Present Illness:  I am asked to evaluate the patient by the dialysis center. The patient was sent here because they were unable to achieve adequate dialysis yesterday. Furthermore the Center states they were unable to aspirate either lumen of the catheter yesterday.  This problem has been getting worse for about 1 week. The patient is unaware of any other change.   Patient denies pain or tenderness overlying the access.  There is no pain with dialysis.  Patient denies fevers or shaking chills while on dialysis.    There have multiple any past interventions and declots of his multiple different access.  The patient is not chronically hypotensive on dialysis.   No current facility-administered medications for this encounter.   Current Outpatient Medications  Medication Sig Dispense Refill   acetaminophen (TYLENOL) 325 MG tablet Place 2 tablets (650 mg total) into feeding tube every 6 (six) hours as needed for mild pain (or Fever >/= 101).     amiodarone (PACERONE) 200 MG tablet Place 1 tablet (200 mg total) into feeding tube 2 (two) times daily. 60 tablet 0   ascorbic acid (VITAMIN C) 500 MG tablet Place 1 tablet (500 mg total) into feeding tube 2 (two) times daily.     atorvastatin (LIPITOR) 40 MG tablet Place 1 tablet (40 mg total) into feeding tube daily. 30 tablet 0   cholestyramine (QUESTRAN) 4 g packet Place 1 packet (4 g total) into feeding tube 3 (three) times daily. 90 each 0   DAPTOmycin 500 mg in sodium chloride 0.9 % 50 mL Inject 500 mg into the vein Every Tuesday,Thursday,and Saturday with dialysis. Stop after 01/03/23 dose (given with dialysis)     diphenoxylate-atropine (LOMOTIL) 2.5-0.025 MG tablet 1 tablet by Per J Tube route 4 (four) times daily as needed for  diarrhea or loose stools. 120 tablet 0   escitalopram (LEXAPRO) 10 MG tablet Place 1 tablet (10 mg total) into feeding tube daily. 30 tablet 0   famotidine (PEPCID) 10 MG tablet Place 1 tablet (10 mg total) into feeding tube daily. 30 tablet 0   fludrocortisone (FLORINEF) 0.1 MG tablet Place 2 tablets (0.2 mg total) into feeding tube daily. 60 tablet 0   metoCLOPramide (REGLAN) 5 MG tablet Take 1 tablet (5 mg total) by mouth 3 (three) times daily before meals. 30 tablet 0   midodrine (PROAMATINE) 10 MG tablet Place 2 tablets (20 mg total) into feeding tube 3 (three) times daily with meals. 30 tablet 0   multivitamin (RENA-VIT) TABS tablet Place 1 tablet into feeding tube at bedtime. 30 tablet 0   Nutritional Supplements (FEEDING SUPPLEMENT, NEPRO CARB STEADY,) LIQD Place 1,120 mLs into feeding tube daily.     Nutritional Supplements (FEEDING SUPPLEMENT, NEPRO CARB STEADY,) LIQD Place 1,120 mLs into feeding tube daily. 1000 mL 0   nystatin (MYCOSTATIN/NYSTOP) powder Apply topically 3 (three) times daily. 56 g 2   polyvinyl alcohol (LIQUIFILM TEARS) 1.4 % ophthalmic solution Place 1 drop into both eyes as needed for dry eyes. 15 mL 0   traZODone (DESYREL) 50 MG tablet Take 0.5 tablets (25 mg total) by mouth at bedtime as needed for sleep. 20 tablet 0   vitamin D3 (CHOLECALCIFEROL) 25 MCG tablet Place 2 tablets (2,000 Units total) into feeding tube daily. 60 tablet  0   Accu-Chek Softclix Lancets lancets 1 each by Other route 4 (four) times daily -  before meals and at bedtime. Use before meals and at bedtime for blood glucose monitoring 100 each 12   Blood Glucose Monitoring Suppl DEVI 1 each by Does not apply route in the morning, at noon, and at bedtime. May substitute to any manufacturer covered by patient's insurance. 1 each 0   Glucose Blood (BLOOD GLUCOSE TEST STRIPS) STRP 1 each by In Vitro route 4 (four) times daily -  before meals and at bedtime. May substitute to any manufacturer covered by  patient's insurance. 120 each 0   hydrOXYzine (ATARAX) 50 MG tablet Take 2 tablets (100 mg total) by mouth 2 (two) times daily. 40 tablet 0   Water For Irrigation, Sterile (FREE WATER) SOLN Place 50 mLs into feeding tube every 4 (four) hours.       Past Surgical History:  Procedure Laterality Date   ABDOMINAL HYSTERECTOMY     APPLICATION OF WOUND VAC Left 05/17/2021   Procedure: APPLICATION OF WOUND VAC/WOUND VAC EXCHANGE-Matrix Myriad;  Surgeon: Carolan Shiver, MD;  Location: [facility];  Service: General;  Laterality: Left;   APPLICATION OF WOUND VAC  05/24/2021   Procedure: APPLICATION OF WOUND VAC;  Surgeon: Carolan Shiver, MD;  Location: [facility];  Service: General;;   APPLICATION OF WOUND VAC  05/10/2021   Procedure: APPLICATION OF WOUND VAC;  Surgeon: Carolan Shiver, MD;  Location: [facility];  Service: General;;   COLONOSCOPY  10/29/2006   Dr Servando Snare   COLONOSCOPY WITH PROPOFOL N/A 11/05/2016   Procedure: COLONOSCOPY WITH PROPOFOL;  Surgeon: Earline Mayotte, MD;  Location: [facility];  Service: Endoscopy;  Laterality: N/A;   DIALYSIS/PERMA CATHETER INSERTION N/A 12/19/2022   Procedure: DIALYSIS/PERMA CATHETER INSERTION;  Surgeon: Annice Needy, MD;  Location: [facility];  Service: Cardiovascular;  Laterality: N/A;   DIALYSIS/PERMA CATHETER INSERTION N/A 02/04/2023   Procedure: DIALYSIS/PERMA CATHETER INSERTION;  Surgeon: Annice Needy, MD;  Location: [facility];  Service: Cardiovascular;  Laterality: N/A;   INCISION AND DRAINAGE OF WOUND Left 05/24/2021   Procedure: IRRIGATION AND DEBRIDEMENT LEFT LEG;  Surgeon: Carolan Shiver, MD;  Location: [facility];  Service: General;  Laterality: Left;   INCISION AND DRAINAGE OF WOUND Left 05/10/2021   Procedure: IRRIGATION AND DEBRIDEMENT LEFT LEG;  Surgeon: Carolan Shiver, MD;  Location: [facility];  Service: General;  Laterality: Left;   IR FLUORO GUIDE CV LINE RIGHT  06/12/2021   IR PERC TUN PERIT  CATH WO PORT S&I /IMAG  06/12/2021   IR REPLC GASTRO/COLONIC TUBE PERCUT W/FLUORO  06/12/2021   IVC FILTER INSERTION N/A 05/29/2021   Procedure: IVC FILTER INSERTION;  Surgeon: Renford Dills, MD;  Location: [facility];  Service: Cardiovascular;  Laterality: N/A;   MINOR GRAFT APPLICATION  05/24/2021   Procedure: Myriad Matrix  APPLICATION;  Surgeon: Carolan Shiver, MD;  Location: [facility];  Service: General;;   NASAL SINUS SURGERY  2002   Dr Chestine Spore   PEG PLACEMENT N/A 05/28/2021   Procedure: PERCUTANEOUS ENDOSCOPIC GASTROSTOMY (PEG) PLACEMENT;  Surgeon: Sung Amabile, DO;  Location: [facility];  Service: General;  Laterality: N/A;  TRAVEL CASE   TRACHEOSTOMY TUBE PLACEMENT N/A 05/17/2021   Procedure: TRACHEOSTOMY;  Surgeon: Geanie Logan, MD;  Location: [facility];  Service: ENT;  Laterality: N/A;   WOUND DEBRIDEMENT Left 05/07/2021   Procedure: DEBRIDEMENT WOUND;  Surgeon: Carolan Shiver, MD;  Location: [facility];  Service: General;  Laterality: Left;     Social History   Tobacco Use   Smoking status: Former    Current packs/day: 0.00    Average packs/day: 2.0 packs/day for 40.0 years (80.0 ttl pk-yrs)    Types: Cigarettes    Start date: 1963    Quit date: 2003    Years since quitting: 21.8   Smokeless tobacco: Former    Types: Snuff    Quit date: 04/2001   Tobacco comments:    smoking cessation materials not required  Vaping Use   Vaping status: Never Used  Substance Use Topics   Alcohol use: No    Alcohol/week: 0.0 standard drinks of alcohol   Drug use: No    Family History  Problem Relation Age of Onset   Congestive Heart Failure Mother    Coronary artery disease Father 64   Breast cancer Sister 63   Heart disease Brother    Varicose Veins Brother    Alcohol abuse Brother     No family history of bleeding or clotting disorders, autoimmune disease or porphyria  No Known Allergies   REVIEW OF SYSTEMS (Negative unless checked)  Constitutional:  [] Weight loss  [] Fever  [] Chills Cardiac: [] Chest pain   [] Chest pressure   [] Palpitations   [] Shortness of breath when laying flat   [] Shortness of breath at rest   [x] Shortness of breath with exertion. Vascular:  [] Pain in legs with walking   [] Pain in legs at rest   [] Pain in legs when laying flat   [] Claudication   [] Pain in feet when walking  [] Pain in feet at rest  [] Pain in feet when laying flat   [] History of DVT   [] Phlebitis   [] Swelling in legs   [] Varicose veins   [] Non-healing ulcers Pulmonary:   [] Uses home oxygen   [] Productive cough   [] Hemoptysis   [] Wheeze  [] COPD   [] Asthma Neurologic:  [] Dizziness  [] Blackouts   [] Seizures   [] History of stroke   [] History of TIA  [] Aphasia   [] Temporary blindness   [] Dysphagia   [] Weakness or numbness in arms   [] Weakness or numbness in legs Musculoskeletal:  [x] Arthritis   [] Joint swelling   [x] Joint pain   [] Low back pain Hematologic:  [] Easy bruising  [] Easy bleeding   [] Hypercoagulable state   [x] Anemic  [] Hepatitis Gastrointestinal:  [] Blood in stool   [] Vomiting blood  [] Gastroesophageal reflux/heartburn   [] Difficulty swallowing. Genitourinary:  [x] Chronic kidney disease   [] Difficult urination  [] Frequent urination  [] Burning with urination   [] Blood in urine Skin:  [] Rashes   [] Ulcers   [] Wounds Psychological:  [] History of anxiety   []  History of major depression.  Physical Examination  Vitals:   02/04/23 1338 02/04/23 1345 02/04/23 1400 02/04/23 1415  BP: 110/62 (!) 101/59 103/62 (!) 98/58  Pulse: 73 74 72 70  Resp: 17 18 16 15   Temp:      TempSrc:      SpO2: 93% 92% 92% 92%  Weight:      Height:       Body mass index is 34.33 kg/m. Gen: WD/WN, NAD Head: [place]/AT, No temporalis wasting.  Ear/Nose/Throat: Hearing grossly intact, nares w/o erythema or drainage, oropharynx w/o Erythema/Exudate,  Eyes: Conjunctiva clear, sclera non-icteric Neck: Trachea midline.  No JVD.  Pulmonary:  Good air movement, respirations not  labored, no use of accessory muscles.  Cardiac: RRR, normal S1, S2. Vascular:  tunneled catheter without tenderness or drainage Vessel Right Left  Radial Palpable Palpable  Gastrointestinal: soft, non-tender/non-distended. No guarding/reflex.  Musculoskeletal: M/S 5/5 throughout.  Extremities without ischemic changes.  No deformity or atrophy.  Neurologic: Sensation grossly intact in extremities.  Symmetrical.  Speech is fluent. Motor exam as listed above. Psychiatric: Judgment intact, Mood & affect appropriate for pt's clinical situation. Dermatologic: No rashes or ulcers noted.  No cellulitis or open wounds.    CBC Lab Results  Component Value Date   WBC 14.6 (H) 01/03/2023   HGB 10.1 (L) 01/03/2023   HCT 33.3 (L) 01/03/2023   MCV 88.8 01/03/2023   PLT 611 (H) 01/03/2023    BMET    Component Value Date/Time   NA 135 01/03/2023 0845   NA 138 08/11/2013 0933   K 3.3 (L) 01/03/2023 0845   K 3.3 (L) 08/11/2013 0933   CL 101 01/03/2023 0845   CL 102 08/11/2013 0933   CO2 26 01/03/2023 0845   CO2 32 08/11/2013 0933   GLUCOSE 95 01/03/2023 0845   GLUCOSE 107 (H) 08/11/2013 0933   BUN 71 (H) 01/03/2023 0845   BUN 13 08/11/2013 0933   CREATININE 2.48 (H) 01/03/2023 0845   CREATININE 0.98 02/11/2021 0941   CALCIUM 9.0 01/03/2023 0845   CALCIUM 9.2 08/11/2013 0933   GFRNONAA 20 (L) 01/03/2023 0845   GFRNONAA 61 07/14/2019 0940   GFRAA 71 07/14/2019 0940   CrCl cannot be calculated (Patient's most recent lab result is older than the maximum 21 days allowed.).  COAG Lab Results  Component Value Date   INR 1.2 12/06/2022   INR 1.1 12/05/2022   INR 1.2 05/28/2021    Radiology PERIPHERAL VASCULAR CATHETERIZATION  Result Date: 02/04/2023 See surgical note for result.   Assessment/Plan 1.  Complication dialysis device with thrombosis AV access:  Patient's tunneled catheter is thrombosed. The patient will undergo exchange of the catheter same venous access using  interventional techniques.  The risks and benefits were described to the patient.  All questions were answered.  The patient agrees to proceed with intervention.  2.  End-stage renal disease requiring hemodialysis:  Patient will continue dialysis therapy without further interruption if a successful exchange is not achieved then new site will be found for tunneled catheter placement. Dialysis has already been arranged since the patient missed their previous session 3.  Hypertension:  Patient will continue medical management; nephrology is following no changes in oral medications. 4. Diabetes mellitus:  Glucose will be monitored and oral medications been held this morning once the patient has undergone the patient's procedure po intake will be reinitiated and again Accu-Cheks will be used to assess the blood glucose level and treat as needed. The patient will be restarted on the patient's usual hypoglycemic regime 5.  Coronary artery disease:  EKG will be monitored. Nitrates will be used if needed. The patient's oral cardiac medications will be continued.    Festus Barren, MD  02/10/2023 4:12 PM

## 2023-02-10 NOTE — Progress Notes (Unsigned)
Name: Alexandra Foster   MRN: [id number]    DOB: 04-06-1951   Date:02/11/2023       Progress Note  Subjective  Chief Complaint  Hospital Follow-Up  HPI  Patient was admitted 05/04/2021 to [facility] with fatigue, pain and chills.  She was diagnosed with sepsis secondary to necrotizing fascitis. During the admission she had to be placed on mechanical ventilation, had a tracheostomy, developed subdural hemorrhage , atrial fibrillation, acute kidney failure ( secondary to ischemic nephropathy) and acute blood loss. She was discharged on 06/2021 to a long term facility . She has been to [facility] multiple times since. She has been back at home since 12/2022 and is now coming back for medication refills  DMII with dyslipidemia: no recent A1C levels, patient denies polyphagia, polydipsia or polyuria. We will check lipid panel today. Foot exam done .   Malnutrition: prior to hospital stay in 04/2021 her weight was 269 lbs today her weight is down to 189 lbs. She lost most of the weight during her first hospital stay . She is on PEG feeding only due to dysphagia  PEG tube: since her hospital stay she has been to [facility] due to leakage, she has not seen surgeon since placement. We will refer her back to Dr. Tonna Boehringer, she also needs tips for feeding  History of HTN/hypotension since she left hospital March 2023/Afib . She had a normal Echo recently. CT abdomen/pelvis did not show any adrenal gland necrosis. She has not seen cardiologist since first admission. She is taking Amiodarone and pressors. 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   Dysthymia/anxiety: phq 9 positive, she also has periods of agitation. She is currently on lexapro, trazodone for sleep and due to anxiety and pruritus  high dose hydroxyzine 100 mg bid. We will switch hydroxyzine to 50 mg to take up to TID and try to go down as tolerated. They also brought otc Benadryl and explained risk of both medications in the elderly   Diarrhea: controlled with Questran  ESRD on HD: she goes three days a week to HD, she has a port   Atherosclerosis of aorta: continue statin therapy  Vaginal discharge: going on for months, per daughter it is in large amount, milky, we are unable to place her on an exam table, we will refer her to gyn   Patient Active Problem List   Diagnosis Date Noted   Malnutrition (HCC) 02/11/2023   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   Cholelithiasis without cholecystitis 02/11/2023   Bowel and bladder incontinence 02/11/2023   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   Diarrhea 12/28/2022   Anemia of chronic disease 12/24/2022   Anxiety and depression 12/06/2022   Type 2 diabetes mellitus with chronic kidney disease, with long-term current use of insulin (HCC) 12/06/2022   ESRD on hemodialysis (HCC) 12/06/2022   Subdural hemorrhage (HCC) 05/18/2021   Atrial fibrillation and flutter (HCC)    Personal history of COVID-19 07/14/2019   Osteoarthritis of both knees 01/14/2018   Urge incontinence 03/14/2016   Osteopenia 02/13/2016   Allergic rhinitis 04/13/2015   COPD (chronic obstructive pulmonary disease) (HCC) 09/18/2014   Esophagitis, reflux 09/18/2014    Past Surgical History:  Procedure Laterality Date   ABDOMINAL HYSTERECTOMY     APPLICATION OF WOUND  VAC Left 05/17/2021   Procedure: APPLICATION OF WOUND VAC/WOUND VAC EXCHANGE-Matrix Myriad;  Surgeon: Carolan Shiver, MD;  Location: [facility] OR[facility];  Service: General;  Laterality: Left;   APPLICATION OF WOUND VAC  05/24/2021   Procedure: APPLICATION OF WOUND VAC;  Surgeon: Carolan Shiver, MD;  Location: [facility];  Service: General;;   APPLICATION OF WOUND VAC  05/10/2021   Procedure: APPLICATION OF  WOUND VAC;  Surgeon: Carolan Shiver, MD;  Location: [facility];  Service: General;;   COLONOSCOPY  10/29/2006   Dr Servando Snare   COLONOSCOPY WITH PROPOFOL N/A 11/05/2016   Procedure: COLONOSCOPY WITH PROPOFOL;  Surgeon: Earline Mayotte, MD;  Location: [facility];  Service: Endoscopy;  Laterality: N/A;   DIALYSIS/PERMA CATHETER INSERTION N/A 12/19/2022   Procedure: DIALYSIS/PERMA CATHETER INSERTION;  Surgeon: Annice Needy, MD;  Location: [facility];  Service: Cardiovascular;  Laterality: N/A;   DIALYSIS/PERMA CATHETER INSERTION N/A 02/04/2023   Procedure: DIALYSIS/PERMA CATHETER INSERTION;  Surgeon: Annice Needy, MD;  Location: [facility];  Service: Cardiovascular;  Laterality: N/A;   INCISION AND DRAINAGE OF WOUND Left 05/24/2021   Procedure: IRRIGATION AND DEBRIDEMENT LEFT LEG;  Surgeon: Carolan Shiver, MD;  Location: [facility];  Service: General;  Laterality: Left;   INCISION AND DRAINAGE OF WOUND Left 05/10/2021   Procedure: IRRIGATION AND DEBRIDEMENT LEFT LEG;  Surgeon: Carolan Shiver, MD;  Location: [facility];  Service: General;  Laterality: Left;   IR FLUORO GUIDE CV LINE RIGHT  06/12/2021   IR PERC TUN PERIT CATH WO PORT S&I /IMAG  06/12/2021   IR REPLC GASTRO/COLONIC TUBE PERCUT W/FLUORO  06/12/2021   IVC FILTER INSERTION N/A 05/29/2021   Procedure: IVC FILTER INSERTION;  Surgeon: Renford Dills, MD;  Location: [facility];  Service: Cardiovascular;  Laterality: N/A;   MINOR GRAFT APPLICATION  05/24/2021   Procedure: Myriad Matrix  APPLICATION;  Surgeon: Carolan Shiver, MD;  Location: [facility];  Service: General;;   NASAL SINUS SURGERY  2002   Dr Chestine Spore   PEG PLACEMENT N/A 05/28/2021   Procedure: PERCUTANEOUS ENDOSCOPIC GASTROSTOMY (PEG) PLACEMENT;  Surgeon: Sung Amabile, DO;  Location: [facility];  Service: General;  Laterality: N/A;  TRAVEL CASE   TRACHEOSTOMY TUBE PLACEMENT N/A 05/17/2021   Procedure: TRACHEOSTOMY;  Surgeon: Geanie Logan, MD;  Location: [facility];  Service: ENT;  Laterality: N/A;   WOUND DEBRIDEMENT Left 05/07/2021   Procedure: DEBRIDEMENT WOUND;  Surgeon: Carolan Shiver, MD;  Location: [facility];  Service: General;  Laterality: Left;    Family History  Problem Relation Age of Onset   Congestive Heart Failure Mother    Coronary artery disease Father 49   Breast cancer Sister 4   Heart disease Brother    Varicose Veins Brother    Alcohol abuse Brother     Social History   Tobacco Use   Smoking status: Former    Current packs/day: 0.00    Average packs/day: 2.0 packs/day for 40.0 years (80.0 ttl pk-yrs)    Types: Cigarettes    Start date: 1963    Quit date: 2003    Years since quitting: 21.8   Smokeless tobacco: Former    Types: Snuff    Quit date: 04/2001   Tobacco comments:    smoking cessation materials not required  Substance Use Topics   Alcohol use: No    Alcohol/week: 0.0 standard drinks of alcohol     Current Outpatient Medications:    Accu-Chek Softclix Lancets lancets, 1  each by Other route 4 (four) times daily -  before meals and at bedtime. Use before meals and at bedtime for blood glucose monitoring, Disp: 100 each, Rfl: 12   acetaminophen (TYLENOL) 325 MG tablet, Place 2 tablets (650 mg total) into feeding tube every 6 (six) hours as needed for mild pain (or Fever >/= 101)., Disp: , Rfl:    amiodarone (PACERONE) 200 MG tablet, Place 1 tablet (200 mg total) into feeding tube 2 (two) times daily., Disp: 60 tablet, Rfl: 0   ascorbic acid (VITAMIN C) 500 MG tablet, Place 1 tablet (500 mg total) into feeding tube 2 (two) times daily., Disp: , Rfl:    atorvastatin (LIPITOR) 40 MG tablet, Place 1 tablet (40 mg total) into feeding tube daily., Disp: 30 tablet, Rfl: 0   Blood Glucose Monitoring Suppl DEVI, 1 each by Does not apply route in the morning, at noon, and at bedtime. May substitute to any manufacturer covered by patient's insurance., Disp: 1 each, Rfl: 0   clonazePAM  (KLONOPIN) 0.5 MG tablet, Take 0.5-1 tablets (0.25-0.5 mg total) by mouth daily as needed for anxiety., Disp: 30 tablet, Rfl: 0   DAPTOmycin 500 mg in sodium chloride 0.9 % 50 mL, Inject 500 mg into the vein Every Tuesday,Thursday,and Saturday with dialysis. Stop after 01/03/23 dose (given with dialysis), Disp: , Rfl:    diphenoxylate-atropine (LOMOTIL) 2.5-0.025 MG tablet, 1 tablet by Per J Tube route 4 (four) times daily as needed for diarrhea or loose stools., Disp: 120 tablet, Rfl: 0   fludrocortisone (FLORINEF) 0.1 MG tablet, Place 2 tablets (0.2 mg total) into feeding tube daily., Disp: 60 tablet, Rfl: 0   Glucose Blood (BLOOD GLUCOSE TEST STRIPS) STRP, 1 each by In Vitro route 4 (four) times daily -  before meals and at bedtime. May substitute to any manufacturer covered by patient's insurance., Disp: 120 each, Rfl: 0   midodrine (PROAMATINE) 10 MG tablet, Place 2 tablets (20 mg total) into feeding tube 3 (three) times daily with meals., Disp: 30 tablet, Rfl: 0   multivitamin (RENA-VIT) TABS tablet, Place 1 tablet into feeding tube at bedtime., Disp: 30 tablet, Rfl: 0   Nutritional Supplements (FEEDING SUPPLEMENT, NEPRO CARB STEADY,) LIQD, Place 1,120 mLs into feeding tube daily., Disp: , Rfl:    Nutritional Supplements (FEEDING SUPPLEMENT, NEPRO CARB STEADY,) LIQD, Place 1,120 mLs into feeding tube daily., Disp: 1000 mL, Rfl: 0   nystatin (MYCOSTATIN/NYSTOP) powder, Apply topically 3 (three) times daily., Disp: 56 g, Rfl: 2   polyvinyl alcohol (LIQUIFILM TEARS) 1.4 % ophthalmic solution, Place 1 drop into both eyes as needed for dry eyes., Disp: 15 mL, Rfl: 0   vitamin D3 (CHOLECALCIFEROL) 25 MCG tablet, Place 2 tablets (2,000 Units total) into feeding tube daily., Disp: 60 tablet, Rfl: 0   Water For Irrigation, Sterile (FREE WATER) SOLN, Place 50 mLs into feeding tube every 4 (four) hours., Disp: , Rfl:    cholestyramine (QUESTRAN) 4 g packet, Place 1 packet (4 g total) into feeding tube 3  (three) times daily., Disp: 90 each, Rfl: 0   escitalopram (LEXAPRO) 10 MG tablet, Place 1 tablet (10 mg total) into feeding tube daily., Disp: 90 tablet, Rfl: 0   famotidine (PEPCID) 10 MG tablet, Place 1 tablet (10 mg total) into feeding tube daily., Disp: 90 tablet, Rfl: 0   hydrOXYzine (ATARAX) 50 MG tablet, Take 1 tablet (50 mg total) by mouth 3 (three) times daily as needed., Disp: 90 tablet, Rfl: 0   metoCLOPramide (  REGLAN) 5 MG tablet, Take 1 tablet (5 mg total) by mouth 3 (three) times daily before meals., Disp: 90 tablet, Rfl: 0   traZODone (DESYREL) 50 MG tablet, Take 0.5 tablets (25 mg total) by mouth at bedtime as needed for sleep., Disp: 45 tablet, Rfl: 0  No Known Allergies  I personally reviewed active problem list, medication list, allergies, family history, social history, health maintenance with the patient/caregiver today.   ROS  Ten systems reviewed and is negative except as mentioned in HPI    Objective  Vitals:   02/11/23 1350  BP: 110/68  Pulse: 62  Resp: 18  Temp: 98 F (36.7 C)  TempSrc: Oral  SpO2: 96%  Weight: 189 lb 12.8 oz (86.1 kg)  Height: 5\' 4"  (1.626 m)    Body mass index is 32.58 kg/m.  Physical Exam  Constitutional: Patient appears well-developed  Obese  No distress.  HEENT: head atraumatic, normocephalic, pupils equal and reactive to light, , neck supple, Cardiovascular: Normal rate, regular rhythm and normal heart sounds.  No murmur heard. No edema on right lower extremity . Left above knee amputation  Pulmonary/Chest: Effort normal and breath sounds normal. No respiratory distress. Abdominal: Soft.  There is no tenderness. PEG tube on left upper quadrant Psychiatric: Patient kept eyes closed most of the time, but able to answer question.    Diabetic Foot Exam - Simple   Simple Foot Form Visual Inspection See comments: Yes Sensation Testing Intact to touch and monofilament testing bilaterally: Yes Pulse Check Posterior Tibialis  and Dorsalis pulse intact bilaterally: Yes Comments Some maceration between toes, discussed how to clean it      PHQ2/9:     02/11/2023    2:39 PM 04/30/2021    8:05 AM 02/11/2021    8:53 AM 08/24/2020    3:22 PM 08/09/2020    3:39 PM  Depression screen PHQ 2/9  Decreased Interest 2 0 0 0 0  Down, Depressed, Hopeless 2 0 0 0 0  PHQ - 2 Score 4 0 0 0 0  Altered sleeping 3      Tired, decreased energy 3      Change in appetite 0      Feeling bad or failure about yourself  2      Trouble concentrating 0      Moving slowly or fidgety/restless 0      Suicidal thoughts 0      PHQ-9 Score 12      Difficult doing work/chores Somewhat difficult        phq 9 is positive    Fall Risk:    02/11/2023    1:49 PM 04/30/2021    8:04 AM 02/11/2021    8:53 AM 08/24/2020    3:22 PM 08/09/2020    3:42 PM  Fall Risk   Falls in the past year? Exclusion - non ambulatory 0 0 0 0  Number falls in past yr:   0 0 0  Injury with Fall?   0 0 0  Risk for fall due to :     No Fall Risks  Follow up Falls prevention discussed;Education provided;Falls evaluation completed Falls prevention discussed Falls evaluation completed Falls evaluation completed Falls prevention discussed     Assessment & Plan  1. End-stage renal disease on hemodialysis (HCC)  Having HD three days a week   2. Dyslipidemia associated with type 2 diabetes mellitus (HCC)  - Hemoglobin A1c  3. Atherosclerosis of aorta (HCC)  - Lipid panel  4. Paroxysmal atrial fibrillation (HCC)  - Ambulatory referral to Cardiology  5. Mild protein-calorie malnutrition (HCC)  Continue feeding per tube  6. Status post insertion of percutaneous endoscopic gastrostomy (PEG) tube (HCC)  - metoCLOPramide (REGLAN) 5 MG tablet; Take 1 tablet (5 mg total) by mouth 3 (three) times daily before meals.  Dispense: 90 tablet; Refill: 0 - famotidine (PEPCID) 10 MG tablet; Place 1 tablet (10 mg total) into feeding tube daily.  Dispense: 90  tablet; Refill: 0 - Ambulatory referral to General Surgery  7. Anxiety and depression  - hydrOXYzine (ATARAX) 50 MG tablet; Take 1 tablet (50 mg total) by mouth 3 (three) times daily as needed.  Dispense: 90 tablet; Refill: 0 - escitalopram (LEXAPRO) 10 MG tablet; Place 1 tablet (10 mg total) into feeding tube daily.  Dispense: 90 tablet; Refill: 0 - clonazePAM (KLONOPIN) 0.5 MG tablet; Take 0.5-1 tablets (0.25-0.5 mg total) by mouth daily as needed for anxiety.  Dispense: 30 tablet; Refill: 0  8. Needs flu shot  - Flu Vaccine Trivalent High Dose (Fluad)  9. Need for Tdap vaccination  - Tdap vaccine greater than or equal to 7yo IM  10. Insomnia, unspecified type  - traZODone (DESYREL) 50 MG tablet; Take 0.5 tablets (25 mg total) by mouth at bedtime as needed for sleep.  Dispense: 45 tablet; Refill: 0  11. Bowel and bladder incontinence  Using depends , has to change at least 3 times per day  12. Vaginal discharge  - Ambulatory referral to Obstetrics / Gynecology  13. Long-term use of high-risk medication  - TSH - Hepatic function panel

## 2023-02-11 ENCOUNTER — Ambulatory Visit (INDEPENDENT_AMBULATORY_CARE_PROVIDER_SITE_OTHER): Payer: Medicare Other | Admitting: Family Medicine

## 2023-02-11 ENCOUNTER — Encounter: Payer: Self-pay | Admitting: Family Medicine

## 2023-02-11 VITALS — BP 110/68 | HR 62 | Temp 98.0°F | Resp 18 | Ht 64.0 in | Wt 189.8 lb

## 2023-02-11 DIAGNOSIS — N186 End stage renal disease: Secondary | ICD-10-CM

## 2023-02-11 DIAGNOSIS — I480 Paroxysmal atrial fibrillation: Secondary | ICD-10-CM

## 2023-02-11 DIAGNOSIS — Z23 Encounter for immunization: Secondary | ICD-10-CM | POA: Diagnosis not present

## 2023-02-11 DIAGNOSIS — F32A Depression, unspecified: Secondary | ICD-10-CM

## 2023-02-11 DIAGNOSIS — N898 Other specified noninflammatory disorders of vagina: Secondary | ICD-10-CM

## 2023-02-11 DIAGNOSIS — F419 Anxiety disorder, unspecified: Secondary | ICD-10-CM

## 2023-02-11 DIAGNOSIS — S78112A Complete traumatic amputation at level between left hip and knee, initial encounter: Secondary | ICD-10-CM | POA: Insufficient documentation

## 2023-02-11 DIAGNOSIS — K8020 Calculus of gallbladder without cholecystitis without obstruction: Secondary | ICD-10-CM | POA: Insufficient documentation

## 2023-02-11 DIAGNOSIS — I700 Atherosclerosis of aorta: Secondary | ICD-10-CM | POA: Diagnosis not present

## 2023-02-11 DIAGNOSIS — Z992 Dependence on renal dialysis: Secondary | ICD-10-CM | POA: Diagnosis not present

## 2023-02-11 DIAGNOSIS — Z931 Gastrostomy status: Secondary | ICD-10-CM

## 2023-02-11 DIAGNOSIS — Z09 Encounter for follow-up examination after completed treatment for conditions other than malignant neoplasm: Secondary | ICD-10-CM

## 2023-02-11 DIAGNOSIS — E1169 Type 2 diabetes mellitus with other specified complication: Secondary | ICD-10-CM | POA: Diagnosis not present

## 2023-02-11 DIAGNOSIS — Z8739 Personal history of other diseases of the musculoskeletal system and connective tissue: Secondary | ICD-10-CM | POA: Insufficient documentation

## 2023-02-11 DIAGNOSIS — Z79899 Other long term (current) drug therapy: Secondary | ICD-10-CM

## 2023-02-11 DIAGNOSIS — E46 Unspecified protein-calorie malnutrition: Secondary | ICD-10-CM | POA: Insufficient documentation

## 2023-02-11 DIAGNOSIS — S78112D Complete traumatic amputation at level between left hip and knee, subsequent encounter: Secondary | ICD-10-CM

## 2023-02-11 DIAGNOSIS — E441 Mild protein-calorie malnutrition: Secondary | ICD-10-CM

## 2023-02-11 DIAGNOSIS — R159 Full incontinence of feces: Secondary | ICD-10-CM | POA: Insufficient documentation

## 2023-02-11 DIAGNOSIS — G4700 Insomnia, unspecified: Secondary | ICD-10-CM

## 2023-02-11 MED ORDER — ESCITALOPRAM OXALATE 10 MG PO TABS: 10.0000 mg | ORAL_TABLET | Freq: Every day | ORAL | 0 refills | Status: DC

## 2023-02-11 MED ORDER — TRAZODONE HCL 50 MG PO TABS: 25.0000 mg | ORAL_TABLET | Freq: Every evening | ORAL | 0 refills | Status: DC | PRN

## 2023-02-11 MED ORDER — CHOLESTYRAMINE 4 G PO PACK: 4.0000 g | PACK | Freq: Three times a day (TID) | ORAL | 0 refills | Status: DC

## 2023-02-11 MED ORDER — METOCLOPRAMIDE HCL 5 MG PO TABS: 5.0000 mg | ORAL_TABLET | Freq: Three times a day (TID) | ORAL | 0 refills | Status: DC

## 2023-02-11 MED ORDER — HYDROXYZINE HCL 50 MG PO TABS: 50.0000 mg | ORAL_TABLET | Freq: Three times a day (TID) | ORAL | 0 refills | Status: DC | PRN

## 2023-02-11 MED ORDER — FAMOTIDINE 10 MG PO TABS: 10.0000 mg | ORAL_TABLET | Freq: Every day | ORAL | 0 refills | Status: DC

## 2023-02-11 MED ORDER — CLONAZEPAM 0.5 MG PO TABS: 0.2500 mg | ORAL_TABLET | Freq: Every day | ORAL | 0 refills | Status: DC | PRN

## 2023-02-11 NOTE — Telephone Encounter (Signed)
Darral Dash called back for an update on request from Monday. Please advise

## 2023-02-11 NOTE — Telephone Encounter (Signed)
Verbal order given to Esther 

## 2023-02-12 ENCOUNTER — Other Ambulatory Visit: Payer: Self-pay

## 2023-02-12 ENCOUNTER — Other Ambulatory Visit: Payer: Self-pay | Admitting: Family Medicine

## 2023-02-12 ENCOUNTER — Ambulatory Visit: Payer: Self-pay

## 2023-02-12 DIAGNOSIS — F419 Anxiety disorder, unspecified: Secondary | ICD-10-CM

## 2023-02-12 DIAGNOSIS — I120 Hypertensive chronic kidney disease with stage 5 chronic kidney disease or end stage renal disease: Secondary | ICD-10-CM

## 2023-02-12 DIAGNOSIS — L89312 Pressure ulcer of right buttock, stage 2: Secondary | ICD-10-CM | POA: Diagnosis not present

## 2023-02-12 DIAGNOSIS — I6529 Occlusion and stenosis of unspecified carotid artery: Secondary | ICD-10-CM

## 2023-02-12 DIAGNOSIS — E1122 Type 2 diabetes mellitus with diabetic chronic kidney disease: Secondary | ICD-10-CM

## 2023-02-12 DIAGNOSIS — I480 Paroxysmal atrial fibrillation: Secondary | ICD-10-CM

## 2023-02-12 DIAGNOSIS — N185 Chronic kidney disease, stage 5: Secondary | ICD-10-CM

## 2023-02-12 DIAGNOSIS — R6521 Severe sepsis with septic shock: Secondary | ICD-10-CM | POA: Diagnosis not present

## 2023-02-12 DIAGNOSIS — Z992 Dependence on renal dialysis: Secondary | ICD-10-CM

## 2023-02-12 DIAGNOSIS — L89322 Pressure ulcer of left buttock, stage 2: Secondary | ICD-10-CM | POA: Diagnosis not present

## 2023-02-12 DIAGNOSIS — A419 Sepsis, unspecified organism: Secondary | ICD-10-CM | POA: Diagnosis not present

## 2023-02-12 DIAGNOSIS — I4891 Unspecified atrial fibrillation: Secondary | ICD-10-CM

## 2023-02-12 DIAGNOSIS — F32A Depression, unspecified: Secondary | ICD-10-CM

## 2023-02-12 DIAGNOSIS — D631 Anemia in chronic kidney disease: Secondary | ICD-10-CM

## 2023-02-12 LAB — HEPATIC FUNCTION PANEL
AG Ratio: 0.7 (calc) — ABNORMAL LOW (ref 1.0–2.5)
ALT: 140 U/L — ABNORMAL HIGH (ref 6–29)
AST: 144 U/L — ABNORMAL HIGH (ref 10–35)
Albumin: 3.3 g/dL — ABNORMAL LOW (ref 3.6–5.1)
Alkaline phosphatase (APISO): 256 U/L — ABNORMAL HIGH (ref 37–153)
Bilirubin, Direct: 0.2 mg/dL (ref 0.0–0.2)
Globulin: 4.5 g/dL — ABNORMAL HIGH (ref 1.9–3.7)
Indirect Bilirubin: 0.2 mg/dL (ref 0.2–1.2)
Total Bilirubin: 0.4 mg/dL (ref 0.2–1.2)
Total Protein: 7.8 g/dL (ref 6.1–8.1)

## 2023-02-12 LAB — LIPID PANEL
Cholesterol: 64 mg/dL (ref <200)
HDL: 27 mg/dL — ABNORMAL LOW (ref >=50)
LDL Cholesterol (Calc): 19 mg/dL
Non-HDL Cholesterol (Calc): 37 mg/dL (ref <130)
Total CHOL/HDL Ratio: 2.4 (calc) (ref <5.0)
Triglycerides: 98 mg/dL (ref <150)

## 2023-02-12 LAB — HEMOGLOBIN A1C
Hgb A1c MFr Bld: 4.7 %{Hb} (ref <5.7)
Mean Plasma Glucose: 88 mg/dL
eAG (mmol/L): 4.9 mmol/L

## 2023-02-12 LAB — TSH: TSH: 3.06 m[IU]/L (ref 0.40–4.50)

## 2023-02-12 MED ORDER — MIDODRINE HCL 10 MG PO TABS: 20.0000 mg | ORAL_TABLET | Freq: Three times a day (TID) | ORAL | 0 refills | Status: DC

## 2023-02-12 MED ORDER — AMIODARONE HCL 200 MG PO TABS: 200.0000 mg | ORAL_TABLET | Freq: Three times a day (TID) | ORAL | 0 refills | Status: DC

## 2023-02-12 MED ORDER — AMIODARONE HCL 200 MG PO TABS: 200.0000 mg | ORAL_TABLET | Freq: Two times a day (BID) | ORAL | 0 refills | Status: DC

## 2023-02-12 MED ORDER — ATORVASTATIN CALCIUM 20 MG PO TABS: 20.0000 mg | ORAL_TABLET | Freq: Every day | ORAL | 0 refills | Status: DC

## 2023-02-12 NOTE — Telephone Encounter (Signed)
Chief Complaint: Medication Question   Disposition: [] ED /[] Urgent Care (no appt availability in office) / [] Appointment(In office/virtual)/ []  [place] [facility]/ [x] Home Care/ [] Refused Recommended Disposition /[] [place] [facility]/ []  Follow-up with PCP Additional Notes: Spoke to Kathlene November, Pharmacist who confirmed he did receive a new Rx with clear directions today. The new order is for Amiodarone 200 MG  Place 1 tablet into feeding tube 2 times daily. #60, 0RF. No further assistance needed at this time.   Summary: med directions   Melissa from [facility] needs clearer directions for pt for med, amiodarone (PACERONE) 200 MG tablet.  It says take twice a day and then 3 times a day.     Reason for Disposition  Caller has medicine question only, adult not sick, AND triager answers question  Answer Assessment - Initial Assessment Questions 1. NAME of MEDICINE: "What medicine(s) are you calling about?"     Amiodarone (PACERONE) 200 MG tablet 2. QUESTION: "What is your question?" (e.g., double dose of medicine, side effect)     I need clearer directions for this medication order should patient be taking it twice daily or three times day?  3. PRESCRIBER: "Who prescribed the medicine?" Reason: if prescribed by specialist, call should be referred to that group.     Dr. Carlynn Purl  Protocols used: Medication Question Call-A-AH

## 2023-02-13 ENCOUNTER — Emergency Department: Payer: Medicare Other

## 2023-02-13 ENCOUNTER — Other Ambulatory Visit: Payer: Self-pay | Admitting: Family Medicine

## 2023-02-13 ENCOUNTER — Inpatient Hospital Stay: Payer: Medicare Other

## 2023-02-13 ENCOUNTER — Inpatient Hospital Stay
Admission: EM | Admit: 2023-02-13 | Discharge: 2023-02-20 | DRG: 314 | Disposition: A | Discharge status: Home Health | Payer: Medicare Other | Attending: Hospitalist | Admitting: Hospitalist

## 2023-02-13 ENCOUNTER — Telehealth (INDEPENDENT_AMBULATORY_CARE_PROVIDER_SITE_OTHER): Payer: Self-pay | Admitting: Nurse Practitioner

## 2023-02-13 ENCOUNTER — Telehealth (INDEPENDENT_AMBULATORY_CARE_PROVIDER_SITE_OTHER): Payer: Self-pay

## 2023-02-13 DIAGNOSIS — G9340 Encephalopathy, unspecified: Secondary | ICD-10-CM | POA: Diagnosis not present

## 2023-02-13 DIAGNOSIS — R6520 Severe sepsis without septic shock: Secondary | ICD-10-CM | POA: Diagnosis present

## 2023-02-13 DIAGNOSIS — E1122 Type 2 diabetes mellitus with diabetic chronic kidney disease: Secondary | ICD-10-CM | POA: Diagnosis present

## 2023-02-13 DIAGNOSIS — Z8614 Personal history of Methicillin resistant Staphylococcus aureus infection: Secondary | ICD-10-CM

## 2023-02-13 DIAGNOSIS — R1310 Dysphagia, unspecified: Secondary | ICD-10-CM | POA: Diagnosis present

## 2023-02-13 DIAGNOSIS — Z811 Family history of alcohol abuse and dependence: Secondary | ICD-10-CM

## 2023-02-13 DIAGNOSIS — I5032 Chronic diastolic (congestive) heart failure: Secondary | ICD-10-CM | POA: Diagnosis present

## 2023-02-13 DIAGNOSIS — N3000 Acute cystitis without hematuria: Secondary | ICD-10-CM

## 2023-02-13 DIAGNOSIS — E669 Obesity, unspecified: Secondary | ICD-10-CM | POA: Diagnosis present

## 2023-02-13 DIAGNOSIS — E1140 Type 2 diabetes mellitus with diabetic neuropathy, unspecified: Secondary | ICD-10-CM | POA: Diagnosis present

## 2023-02-13 DIAGNOSIS — L8990 Pressure ulcer of unspecified site, unspecified stage: Secondary | ICD-10-CM | POA: Insufficient documentation

## 2023-02-13 DIAGNOSIS — G928 Other toxic encephalopathy: Secondary | ICD-10-CM | POA: Diagnosis present

## 2023-02-13 DIAGNOSIS — E2740 Unspecified adrenocortical insufficiency: Secondary | ICD-10-CM | POA: Insufficient documentation

## 2023-02-13 DIAGNOSIS — G9341 Metabolic encephalopathy: Secondary | ICD-10-CM

## 2023-02-13 DIAGNOSIS — L8992 Pressure ulcer of unspecified site, stage 2: Secondary | ICD-10-CM | POA: Insufficient documentation

## 2023-02-13 DIAGNOSIS — Z7952 Long term (current) use of systemic steroids: Secondary | ICD-10-CM

## 2023-02-13 DIAGNOSIS — I132 Hypertensive heart and chronic kidney disease with heart failure and with stage 5 chronic kidney disease, or end stage renal disease: Secondary | ICD-10-CM | POA: Diagnosis present

## 2023-02-13 DIAGNOSIS — I272 Pulmonary hypertension, unspecified: Secondary | ICD-10-CM | POA: Diagnosis present

## 2023-02-13 DIAGNOSIS — F419 Anxiety disorder, unspecified: Secondary | ICD-10-CM | POA: Diagnosis present

## 2023-02-13 DIAGNOSIS — Z89612 Acquired absence of left leg above knee: Secondary | ICD-10-CM | POA: Diagnosis not present

## 2023-02-13 DIAGNOSIS — Z992 Dependence on renal dialysis: Secondary | ICD-10-CM

## 2023-02-13 DIAGNOSIS — A419 Sepsis, unspecified organism: Secondary | ICD-10-CM | POA: Diagnosis present

## 2023-02-13 DIAGNOSIS — I480 Paroxysmal atrial fibrillation: Secondary | ICD-10-CM | POA: Diagnosis present

## 2023-02-13 DIAGNOSIS — I4891 Unspecified atrial fibrillation: Secondary | ICD-10-CM

## 2023-02-13 DIAGNOSIS — R001 Bradycardia, unspecified: Secondary | ICD-10-CM | POA: Diagnosis present

## 2023-02-13 DIAGNOSIS — N2581 Secondary hyperparathyroidism of renal origin: Secondary | ICD-10-CM | POA: Diagnosis present

## 2023-02-13 DIAGNOSIS — N186 End stage renal disease: Secondary | ICD-10-CM | POA: Diagnosis present

## 2023-02-13 DIAGNOSIS — T3695XA Adverse effect of unspecified systemic antibiotic, initial encounter: Secondary | ICD-10-CM | POA: Diagnosis not present

## 2023-02-13 DIAGNOSIS — Z1152 Encounter for screening for COVID-19: Secondary | ICD-10-CM | POA: Diagnosis not present

## 2023-02-13 DIAGNOSIS — R6521 Severe sepsis with septic shock: Secondary | ICD-10-CM | POA: Diagnosis present

## 2023-02-13 DIAGNOSIS — J189 Pneumonia, unspecified organism: Secondary | ICD-10-CM | POA: Diagnosis not present

## 2023-02-13 DIAGNOSIS — E876 Hypokalemia: Secondary | ICD-10-CM | POA: Diagnosis present

## 2023-02-13 DIAGNOSIS — Z6832 Body mass index (BMI) 32.0-32.9, adult: Secondary | ICD-10-CM

## 2023-02-13 DIAGNOSIS — E785 Hyperlipidemia, unspecified: Secondary | ICD-10-CM | POA: Diagnosis present

## 2023-02-13 DIAGNOSIS — Z803 Family history of malignant neoplasm of breast: Secondary | ICD-10-CM

## 2023-02-13 DIAGNOSIS — I2510 Atherosclerotic heart disease of native coronary artery without angina pectoris: Secondary | ICD-10-CM | POA: Diagnosis present

## 2023-02-13 DIAGNOSIS — K7460 Unspecified cirrhosis of liver: Secondary | ICD-10-CM | POA: Diagnosis present

## 2023-02-13 DIAGNOSIS — J690 Pneumonitis due to inhalation of food and vomit: Secondary | ICD-10-CM | POA: Diagnosis present

## 2023-02-13 DIAGNOSIS — Z87891 Personal history of nicotine dependence: Secondary | ICD-10-CM

## 2023-02-13 DIAGNOSIS — E66811 Obesity, class 1: Secondary | ICD-10-CM | POA: Diagnosis present

## 2023-02-13 DIAGNOSIS — Y848 Other medical procedures as the cause of abnormal reaction of the patient, or of later complication, without mention of misadventure at the time of the procedure: Secondary | ICD-10-CM | POA: Diagnosis present

## 2023-02-13 DIAGNOSIS — Z79899 Other long term (current) drug therapy: Secondary | ICD-10-CM

## 2023-02-13 DIAGNOSIS — R0609 Other forms of dyspnea: Secondary | ICD-10-CM | POA: Diagnosis not present

## 2023-02-13 DIAGNOSIS — Z931 Gastrostomy status: Secondary | ICD-10-CM

## 2023-02-13 DIAGNOSIS — L89322 Pressure ulcer of left buttock, stage 2: Secondary | ICD-10-CM | POA: Diagnosis present

## 2023-02-13 DIAGNOSIS — Z8249 Family history of ischemic heart disease and other diseases of the circulatory system: Secondary | ICD-10-CM

## 2023-02-13 DIAGNOSIS — Z515 Encounter for palliative care: Secondary | ICD-10-CM | POA: Diagnosis not present

## 2023-02-13 DIAGNOSIS — E872 Acidosis, unspecified: Secondary | ICD-10-CM | POA: Diagnosis present

## 2023-02-13 DIAGNOSIS — R471 Dysarthria and anarthria: Secondary | ICD-10-CM | POA: Diagnosis present

## 2023-02-13 DIAGNOSIS — J9811 Atelectasis: Secondary | ICD-10-CM | POA: Diagnosis present

## 2023-02-13 DIAGNOSIS — R112 Nausea with vomiting, unspecified: Secondary | ICD-10-CM | POA: Diagnosis present

## 2023-02-13 DIAGNOSIS — Z87440 Personal history of urinary (tract) infections: Secondary | ICD-10-CM

## 2023-02-13 DIAGNOSIS — G4733 Obstructive sleep apnea (adult) (pediatric): Secondary | ICD-10-CM | POA: Diagnosis present

## 2023-02-13 DIAGNOSIS — T80211A Bloodstream infection due to central venous catheter, initial encounter: Principal | ICD-10-CM | POA: Diagnosis present

## 2023-02-13 DIAGNOSIS — K219 Gastro-esophageal reflux disease without esophagitis: Secondary | ICD-10-CM | POA: Diagnosis present

## 2023-02-13 DIAGNOSIS — R197 Diarrhea, unspecified: Secondary | ICD-10-CM | POA: Diagnosis not present

## 2023-02-13 DIAGNOSIS — Z90710 Acquired absence of both cervix and uterus: Secondary | ICD-10-CM

## 2023-02-13 DIAGNOSIS — Z5986 Financial insecurity: Secondary | ICD-10-CM

## 2023-02-13 HISTORY — DX: Unspecified adrenocortical insufficiency: E27.40

## 2023-02-13 LAB — COMPREHENSIVE METABOLIC PANEL
ALT: 135 U/L — ABNORMAL HIGH (ref 0–44)
AST: 139 U/L — ABNORMAL HIGH (ref 15–41)
Albumin: 2.7 g/dL — ABNORMAL LOW (ref 3.5–5.0)
Alkaline Phosphatase: 199 U/L — ABNORMAL HIGH (ref 38–126)
Anion gap: 11 (ref 5–15)
BUN: 52 mg/dL — ABNORMAL HIGH (ref 8–23)
CO2: 20 mmol/L — ABNORMAL LOW (ref 22–32)
Calcium: 8.7 mg/dL — ABNORMAL LOW (ref 8.9–10.3)
Chloride: 105 mmol/L (ref 98–111)
Creatinine, Ser: 3.22 mg/dL — ABNORMAL HIGH (ref 0.44–1.00)
GFR, Estimated: 15 mL/min — ABNORMAL LOW (ref >60)
Glucose, Bld: 109 mg/dL — ABNORMAL HIGH (ref 70–99)
Potassium: 4.5 mmol/L (ref 3.5–5.1)
Sodium: 136 mmol/L (ref 135–145)
Total Bilirubin: 0.9 mg/dL (ref 0.3–1.2)
Total Protein: 7.6 g/dL (ref 6.5–8.1)

## 2023-02-13 LAB — CBC WITH DIFFERENTIAL/PLATELET
Abs Immature Granulocytes: 0.04 10*3/uL (ref 0.00–0.07)
Basophils Absolute: 0.1 10*3/uL (ref 0.0–0.1)
Basophils Relative: 0 %
Eosinophils Absolute: 2.8 10*3/uL — ABNORMAL HIGH (ref 0.0–0.5)
Eosinophils Relative: 22 %
HCT: 43.5 % (ref 36.0–46.0)
Hemoglobin: 13.1 g/dL (ref 12.0–15.0)
Immature Granulocytes: 0 %
Lymphocytes Relative: 17 %
Lymphs Abs: 2.2 10*3/uL (ref 0.7–4.0)
MCH: 25.8 pg — ABNORMAL LOW (ref 26.0–34.0)
MCHC: 30.1 g/dL (ref 30.0–36.0)
MCV: 85.8 fL (ref 80.0–100.0)
Monocytes Absolute: 0.8 10*3/uL (ref 0.1–1.0)
Monocytes Relative: 7 %
Neutro Abs: 6.7 10*3/uL (ref 1.7–7.7)
Neutrophils Relative %: 54 %
Platelets: 297 10*3/uL (ref 150–400)
RBC: 5.07 MIL/uL (ref 3.87–5.11)
RDW: 15.1 % (ref 11.5–15.5)
Smear Review: NORMAL
WBC: 12.6 10*3/uL — ABNORMAL HIGH (ref 4.0–10.5)
nRBC: 0 % (ref 0.0–0.2)

## 2023-02-13 LAB — URINALYSIS, W/ REFLEX TO CULTURE (INFECTION SUSPECTED)
Bilirubin Urine: NEGATIVE
Glucose, UA: NEGATIVE mg/dL
Hgb urine dipstick: NEGATIVE
Ketones, ur: NEGATIVE mg/dL
Nitrite: NEGATIVE
Protein, ur: 30 mg/dL — AB
Specific Gravity, Urine: 1.016 (ref 1.005–1.030)
WBC, UA: >50 WBC/hpf (ref 0–5)
pH: 5 (ref 5.0–8.0)

## 2023-02-13 LAB — LACTIC ACID, PLASMA
Lactic Acid, Venous: 1.7 mmol/L (ref 0.5–1.9)
Lactic Acid, Venous: 1.3 mmol/L (ref 0.5–1.9)

## 2023-02-13 LAB — PROTIME-INR
INR: 1.2 (ref 0.8–1.2)
Prothrombin Time: 14.9 s (ref 11.4–15.2)

## 2023-02-13 LAB — PATHOLOGIST SMEAR REVIEW

## 2023-02-13 LAB — RESP PANEL BY RT-PCR (RSV, FLU A&B, COVID)  RVPGX2
Influenza A by PCR: NEGATIVE
Influenza B by PCR: NEGATIVE
Resp Syncytial Virus by PCR: NEGATIVE
SARS Coronavirus 2 by RT PCR: NEGATIVE

## 2023-02-13 LAB — MRSA NEXT GEN BY PCR, NASAL: MRSA by PCR Next Gen: DETECTED — AB

## 2023-02-13 MED ORDER — HYDROCORTISONE SOD SUC (PF) 100 MG IJ SOLR: 50.0000 mg | Freq: Four times a day (QID) | INTRAMUSCULAR | Status: DC

## 2023-02-13 MED ORDER — HEPARIN SODIUM (PORCINE) 5000 UNIT/ML IJ SOLN
5000.0000 [IU] | Freq: Two times a day (BID) | INTRAMUSCULAR | Status: DC
  Administered 2023-02-13 – 2023-02-20 (×12): 5000 [IU] via SUBCUTANEOUS
  Filled 2023-02-13 (×14): qty 1

## 2023-02-13 MED ORDER — IPRATROPIUM-ALBUTEROL 0.5-2.5 (3) MG/3ML IN SOLN
3.0000 mL | Freq: Four times a day (QID) | RESPIRATORY_TRACT | Status: DC
  Administered 2023-02-13 – 2023-02-16 (×11): 3 mL via RESPIRATORY_TRACT
  Filled 2023-02-13 (×11): qty 3

## 2023-02-13 MED ORDER — CLONAZEPAM 0.5 MG PO TABS
0.5000 mg | ORAL_TABLET | Freq: Two times a day (BID) | ORAL | Status: DC | PRN
  Administered 2023-02-15 – 2023-02-18 (×4): 0.5 mg via ORAL
  Filled 2023-02-13 (×4): qty 1

## 2023-02-13 MED ORDER — VANCOMYCIN HCL IN DEXTROSE 1-5 GM/200ML-% IV SOLN
1000.0000 mg | Freq: Once | INTRAVENOUS | Status: AC
  Administered 2023-02-13: 1000 mg via INTRAVENOUS
  Filled 2023-02-13: qty 200

## 2023-02-13 MED ORDER — ESCITALOPRAM OXALATE 10 MG PO TABS
10.0000 mg | ORAL_TABLET | Freq: Every day | ORAL | Status: DC
Start: 2023-02-13 — End: 2023-02-20
  Administered 2023-02-13 – 2023-02-20 (×7): 10 mg
  Filled 2023-02-13 (×7): qty 1

## 2023-02-13 MED ORDER — HYDROCORTISONE SOD SUC (PF) 100 MG IJ SOLR
100.0000 mg | Freq: Two times a day (BID) | INTRAMUSCULAR | Status: DC
  Administered 2023-02-13 – 2023-02-14 (×2): 100 mg via INTRAVENOUS
  Filled 2023-02-13 (×2): qty 2

## 2023-02-13 MED ORDER — TRAZODONE HCL 50 MG PO TABS
25.0000 mg | ORAL_TABLET | Freq: Every evening | ORAL | Status: DC | PRN
  Administered 2023-02-15 – 2023-02-18 (×4): 25 mg via ORAL
  Filled 2023-02-13 (×4): qty 1

## 2023-02-13 MED ORDER — MIDODRINE HCL 5 MG PO TABS
20.0000 mg | ORAL_TABLET | Freq: Three times a day (TID) | ORAL | Status: DC
  Administered 2023-02-13: 10 mg via ORAL
  Administered 2023-02-14 – 2023-02-18 (×12): 20 mg via ORAL
  Filled 2023-02-13 (×12): qty 4

## 2023-02-13 MED ORDER — POLYVINYL ALCOHOL 1.4 % OP SOLN: 1.0000 [drp] | OPHTHALMIC | Status: DC | PRN

## 2023-02-13 MED ORDER — ACETAMINOPHEN 325 MG PO TABS
650.0000 mg | ORAL_TABLET | Freq: Four times a day (QID) | ORAL | Status: DC | PRN
  Administered 2023-02-13 – 2023-02-17 (×3): 650 mg
  Filled 2023-02-13 (×3): qty 2

## 2023-02-13 MED ORDER — SODIUM CHLORIDE 0.9 % IV SOLN
2.0000 g | Freq: Once | INTRAVENOUS | Status: AC
  Administered 2023-02-13: 2 g via INTRAVENOUS
  Filled 2023-02-13: qty 12.5

## 2023-02-13 MED ORDER — METOCLOPRAMIDE HCL 5 MG/ML IJ SOLN: 5.0000 mg | Freq: Four times a day (QID) | INTRAMUSCULAR | Status: DC | PRN

## 2023-02-13 MED ORDER — LACTATED RINGERS IV BOLUS
1000.0000 mL | Freq: Once | INTRAVENOUS | Status: AC
  Administered 2023-02-13: 1000 mL via INTRAVENOUS

## 2023-02-13 MED ORDER — ONDANSETRON HCL 4 MG/2ML IJ SOLN
4.0000 mg | Freq: Once | INTRAMUSCULAR | Status: AC
  Administered 2023-02-13: 4 mg via INTRAVENOUS
  Filled 2023-02-13: qty 2

## 2023-02-13 MED ORDER — ATORVASTATIN CALCIUM 20 MG PO TABS
20.0000 mg | ORAL_TABLET | Freq: Every day | ORAL | Status: DC
Start: 2023-02-13 — End: 2023-02-20
  Administered 2023-02-13 – 2023-02-20 (×7): 20 mg
  Filled 2023-02-13 (×8): qty 1

## 2023-02-13 MED ORDER — PANCRELIPASE (LIP-PROT-AMYL) 10440-39150 UNITS PO TABS
20880.0000 [IU] | ORAL_TABLET | Freq: Once | ORAL | Status: AC
  Administered 2023-02-14: 20880 [IU]
  Filled 2023-02-13: qty 2

## 2023-02-13 MED ORDER — SODIUM BICARBONATE 650 MG PO TABS
650.0000 mg | ORAL_TABLET | Freq: Once | ORAL | Status: AC
  Administered 2023-02-14: 650 mg
  Filled 2023-02-13: qty 1

## 2023-02-13 MED ORDER — DEXTROSE 5 % IV SOLN
500.0000 mg | Freq: Once | INTRAVENOUS | Status: AC
  Administered 2023-02-13: 500 mg via INTRAVENOUS
  Filled 2023-02-13: qty 5

## 2023-02-13 MED ORDER — HYDROXYZINE HCL 25 MG PO TABS: 50.0000 mg | ORAL_TABLET | Freq: Three times a day (TID) | ORAL | Status: DC | PRN

## 2023-02-13 MED ORDER — DIPHENOXYLATE-ATROPINE 2.5-0.025 MG PO TABS
1.0000 | ORAL_TABLET | Freq: Four times a day (QID) | ORAL | Status: DC | PRN
  Administered 2023-02-18 (×2): 1 via JEJUNOSTOMY
  Filled 2023-02-13 (×2): qty 1

## 2023-02-13 MED ORDER — ZINC OXIDE 40 % EX OINT
TOPICAL_OINTMENT | Freq: Three times a day (TID) | CUTANEOUS | Status: DC | PRN
  Filled 2023-02-13: qty 113

## 2023-02-13 MED ORDER — MIDODRINE HCL 5 MG PO TABS
10.0000 mg | ORAL_TABLET | Freq: Three times a day (TID) | ORAL | Status: DC
  Administered 2023-02-13: 10 mg via ORAL
  Filled 2023-02-13: qty 2

## 2023-02-13 MED ORDER — PIPERACILLIN-TAZOBACTAM IN DEX 2-0.25 GM/50ML IV SOLN
2.2500 g | Freq: Three times a day (TID) | INTRAVENOUS | Status: DC
  Administered 2023-02-13 – 2023-02-17 (×11): 2.25 g via INTRAVENOUS
  Filled 2023-02-13 (×16): qty 50

## 2023-02-13 MED ORDER — PANTOPRAZOLE SODIUM 40 MG IV SOLR
40.0000 mg | INTRAVENOUS | Status: DC
  Administered 2023-02-13 – 2023-02-15 (×3): 40 mg via INTRAVENOUS
  Filled 2023-02-13 (×5): qty 10

## 2023-02-13 MED ORDER — SODIUM CHLORIDE 0.9 % IV BOLUS (SEPSIS)
1000.0000 mL | Freq: Once | INTRAVENOUS | Status: AC
  Administered 2023-02-13: 1000 mL via INTRAVENOUS

## 2023-02-13 MED ORDER — HYDROXYZINE HCL 25 MG PO TABS
50.0000 mg | ORAL_TABLET | Freq: Two times a day (BID) | ORAL | Status: DC
  Administered 2023-02-13 – 2023-02-14 (×2): 50 mg via ORAL
  Filled 2023-02-13 (×2): qty 2

## 2023-02-13 MED ORDER — CHOLESTYRAMINE 4 G PO PACK
4.0000 g | PACK | Freq: Three times a day (TID) | ORAL | Status: DC
Start: 2023-02-13 — End: 2023-02-20
  Administered 2023-02-13 – 2023-02-20 (×18): 4 g
  Filled 2023-02-13 (×23): qty 1

## 2023-02-13 MED ORDER — BUDESONIDE 0.25 MG/2ML IN SUSP
0.2500 mg | Freq: Two times a day (BID) | RESPIRATORY_TRACT | Status: DC
  Administered 2023-02-13 – 2023-02-19 (×12): 0.25 mg via RESPIRATORY_TRACT
  Filled 2023-02-13 (×12): qty 2

## 2023-02-13 NOTE — Progress Notes (Signed)
CODE SEPSIS - PHARMACY COMMUNICATION  **Broad Spectrum Antibiotics should be administered within 1 hour of Sepsis diagnosis**  Time Code Sepsis Called/Page Received: 1610  Antibiotics Ordered: Cefepime + vancomycin + azithromycin  Time of 1st antibiotic administration: 0806  Additional action taken by pharmacy: N/A  Tressie Ellis 02/13/2023  7:58 AM

## 2023-02-13 NOTE — Consult Note (Signed)
PHARMACY -  BRIEF ANTIBIOTIC NOTE   Pharmacy has received consult(s) for cefepime and vancomycin from an ED provider. Patient is also ordered azithromycin. The patient's profile has been reviewed for ht/wt/allergies/indication/available labs.   Hx ESRD on HD. Recent history of MRSA bacteremia admitted 12/05/22 - 01/03/23. Further history of Enterococcus in urine and PsA in respiratory cultures.  One time order(s) placed for  --Cefepime 2 g IV --Vancomycin 1 g IV  Further antibiotics/pharmacy consults should be ordered by admitting physician if indicated.                       Thank you, Tressie Ellis 02/13/2023  7:55 AM

## 2023-02-13 NOTE — Consult Note (Signed)
WOC Nurse Consult Note: patient brought to ED for possible infection at G tube,  Per MD note examination does not support infection of G tube; this consult was performed remotely utilizing EMR records including photo documentation  Reason for Consult: G tube leaking  Wound type: minimal irritant contact dermatitis from G tube drainage ICD-10 CM Codes for Irritant Dermatitis L24B1 - Related to digestive stoma or fistula Pressure Injury POA: NA  Measurement: Wound bed: minimal erythema, partial thickness skin loss from 6 to 8 o'clock  Drainage (amount, consistency, odor) moderate amount tan drainage noted on old dressing however unsure how long dressing had been in place  Periwound:  Dressing procedure/placement/frequency: Clean around G tube with NS, apply a thin layer of Desitin to skin surrounding G tube.  May cut silver hydrofiber (Aquacel AG [place] [phone]) in same fashion as split gauze and place around G tube to absorb drainage.  If more absorption is needed order Drawtex 4x4 dressing [place] [phone]) and place around G tube.   POC discussed with bedside nurse. WOC team will not follow. Re-consult if further needs arise.   Thank you,    Priscella Mann MSN, RN-BC, [employer] [phone]

## 2023-02-13 NOTE — ED Triage Notes (Signed)
Pt presents to ER via ems form home for a diagnosed UTI that has turned into AMS tonight. Family states pt is normally a and ox4. Pt is currently alert but oriented to person and place but not time. EMS states pt is currently being treated for said UTI. No foley present. Pt has left AK amputation. Pt has J tube and port in left chest as well. Resp are even and unlabored but slightly tachypneic. Skin is dry warm and appropriate for ethnicity.

## 2023-02-13 NOTE — ED Provider Notes (Signed)
[facility] Provider Note    Event Date/Time   First MD Initiated Contact with Patient 02/13/23 [phone]     (approximate)   History   Altered Mental Status (Pt presents to ER via ems form home for a diagnosed UTI that has turned into AMS tonight. Family states pt is normally a and ox4. Pt is currently alert but oriented to person and place but not time. EMS states pt is currently being treated for said UTI. No foley present. Pt has left AK amputation. Pt has J tube and port in left chest as well. Resp are even and unlabored but slightly tachypneic. Skin is dry warm and appropriate for ethnicity. )   HPI  Alexandra Foster is a 72 year old female with history of diabetes, asthma/COPD, ESRD on HD, HTN, A-fib presenting to the emergency department for evaluation of altered mental status.  History obtained from patient's daughter and husband who are at bedside.  They report that earlier today, patient was noted to be agitated, making nonsensical statements.  Reports she has had similar presentations in the setting of infection and are concerned that she might have a UTI.  They do confirm that she does not have a diagnosed UTI and is not currently on treatment.  No noted fevers or chills.  I reviewed her discharge summary from 01/03/2023.  At that time, patient presented with altered mental status, found to have septic shock, MRSA bacteremia E faecalis UTI, aspiration pneumonia for which patient was treated with daptomycin.         Physical Exam   Triage Vital Signs: ED Triage Vitals  Encounter Vitals Group     BP 02/13/23 0429 98/63     Systolic BP Percentile --      Diastolic BP Percentile --      Pulse Rate 02/13/23 0429 (!) 57     Resp 02/13/23 0429 (!) 24     Temp 02/13/23 0429 97.7 F (36.5 C)     Temp Source 02/13/23 0429 Oral     SpO2 02/13/23 0428 98 %     Weight 02/13/23 0432 189 lb 9.5 oz (86 kg)     Height 02/13/23 0432 5\' 4"  (1.626 m)     Head  Circumference --      Peak Flow --      Pain Score 02/13/23 0431 0     Pain Loc --      Pain Education --      Exclude from Growth Chart --     Most recent vital signs: Vitals:   02/13/23 0530 02/13/23 0545  BP: 106/61   Pulse: (!) 52 (!) 53  Resp: (!) 21 18  Temp:    SpO2: 97% 97%     General: Awake, interactive, not acutely distressed CV:  Regular rate Resp:  Lung sounds mildly diminished bilaterally, mild tachypnea present Abd:  Saw, nontender, no appreciable tenderness Neuro:  No gross facial asymmetry, able to tell me her name and that we are in the hospital, not oriented to time or situation, moving extremity spontaneously Skin:  Mild skin breakdown in the sacral region, does not appear acutely infected  ED Results / Procedures / Treatments   Labs (all labs ordered are listed, but only abnormal results are displayed) Labs Reviewed  COMPREHENSIVE METABOLIC PANEL - Abnormal; Notable for the following components:      Result Value   CO2 20 (*)    Glucose, Bld 109 (*)    BUN  52 (*)    Creatinine, Ser 3.22 (*)    Calcium 8.7 (*)    Albumin 2.7 (*)    AST 139 (*)    ALT 135 (*)    Alkaline Phosphatase 199 (*)    GFR, Estimated 15 (*)    All other components within normal limits  URINALYSIS, W/ REFLEX TO CULTURE (INFECTION SUSPECTED) - Abnormal; Notable for the following components:   Color, Urine AMBER (*)    APPearance CLOUDY (*)    Protein, ur 30 (*)    Leukocytes,Ua LARGE (*)    Bacteria, UA RARE (*)    Non Squamous Epithelial PRESENT (*)    All other components within normal limits  CBC WITH DIFFERENTIAL/PLATELET - Abnormal; Notable for the following components:   WBC 12.6 (*)    MCH 25.8 (*)    Eosinophils Absolute 2.8 (*)    All other components within normal limits  CULTURE, BLOOD (ROUTINE X 2)  CULTURE, BLOOD (ROUTINE X 2)  URINE CULTURE  RESP PANEL BY RT-PCR (RSV, FLU A&B, COVID)  RVPGX2  LACTIC ACID, PLASMA  LACTIC ACID, PLASMA  PROTIME-INR   CBC WITH DIFFERENTIAL/PLATELET  PATHOLOGIST SMEAR REVIEW     EKG EKG independently reviewed interpreted by myself (ER attending) demonstrates:  EKG demonstrates sinus or junctional rhythm at a rate of 54, QRS 210, he QTc listed at 565, but shorter on my review  RADIOLOGY Imaging independently reviewed and interpreted by myself demonstrates:  CT head without acute bleed CT chest abdomen pelvis with airspace disease in the right lung concerning for infection  PROCEDURES:  Critical Care performed: Yes, see critical care procedure note(s)  CRITICAL CARE Performed by: Trinna Post   Total critical care time: 32 minutes  Critical care time was exclusive of separately billable procedures and treating other patients.  Critical care was necessary to treat or prevent imminent or life-threatening deterioration.  Critical care was time spent personally by me on the following activities: development of treatment plan with patient and/or surrogate as well as nursing, discussions with consultants, evaluation of patient's response to treatment, examination of patient, obtaining history from patient or surrogate, ordering and performing treatments and interventions, ordering and review of laboratory studies, ordering and review of radiographic studies, pulse oximetry and re-evaluation of patient's condition.   Procedures   MEDICATIONS ORDERED IN ED: Medications  vancomycin (VANCOCIN) IVPB 1000 mg/200 mL premix (has no administration in time range)  ceFEPIme (MAXIPIME) 2 g in sodium chloride 0.9 % 100 mL IVPB (has no administration in time range)  azithromycin (ZITHROMAX) 500 mg in dextrose 5 % 250 mL IVPB (has no administration in time range)  sodium chloride 0.9 % bolus 1,000 mL (has no administration in time range)     IMPRESSION / MDM / ASSESSMENT AND PLAN / ED COURSE  I reviewed the triage vital signs and the nursing notes.  Differential diagnosis includes, but is not limited to, acute  infection including UTI, pneumonia, other infectious source, acute intrarenal process, anemia, electrode abnormality  Patient's presentation is most consistent with acute presentation with potential threat to life or bodily function.  72 year old female presenting with encephalopathy similar to prior presentations when acutely infected.  Blood cultures and labs sent, but does not meet sepsis criteria on presentation.  Lab work did result with mild leukocytosis WC of 12.6.  Urine concerning for infection with large leukocyte esterase and greater than 50 white blood cells.  Possible pneumonia on CT.  Labs with AKI.  Upon identification  of questionable sepsis, sepsis orders were initiated with 1 L of IV fluid and empiric antibiotics with cefepime, vancomycin, and azithromycin given IV antibiotic use within the past 3 months.  Will reach out to hospitalist team to discuss admission.    FINAL CLINICAL IMPRESSION(S) / ED DIAGNOSES   Final diagnoses:  Acute encephalopathy  Community acquired pneumonia of right lung, unspecified part of lung  Acute cystitis without hematuria     Rx / DC Orders   ED Discharge Orders     None        Note:  This document was prepared using Dragon voice recognition software and may include unintentional dictation errors.   Trinna Post, MD 02/13/23 [phone]

## 2023-02-13 NOTE — Progress Notes (Signed)
ELink following for sepsis protocol

## 2023-02-13 NOTE — Telephone Encounter (Signed)
Copied from CRM [id number]. Topic: General - Other >> Feb 13, 2023  8:10 AM Phill Myron wrote: Home Health Verbal Orders - Caller/Agency:  Darral Dash with [facility]  Callback Number: [phone] Requesting OT/PT/Skilled Nursing/Social Work/Speech Therapy:  OT   Frequency:   1w1, 2w3

## 2023-02-13 NOTE — ED Notes (Signed)
Pt left to CT

## 2023-02-13 NOTE — Telephone Encounter (Signed)
I received an order to have the patient's permcath exchanged. I contacted the patient's daughter and the patient has been admitted to the hospital and per the daughter she told the doctor that someone needs to look at the patient's permcath as well.

## 2023-02-13 NOTE — H&P (Addendum)
History and Physical    Alexandra Foster ZOX:[id number] DOB: 08-30-1950 DOA: 02/13/2023  PCP: [name], MD (Confirm with patient/family/NH records and if not entered, this has to be entered at [facility] point of entry) Patient coming from: SNF  I have personally briefly reviewed patient's old medical records in [facility]  Chief Complaint: Altered mentations  HPI: Alexandra Foster is a 72 y.o. female with medical history significant of ESRD on HD TTS, via a left chest PermCath, asthma/COPD, severe dysphagia status post G-tube, IIDM, HTN, left AKA, PAF, OSA on CPAP, cirrhosis, adrenal insufficiency on Cortef, brought in by family member for evaluation of sepsis.  Patient is confused, or history provided by daughter over the phone and patient's sister at bedside.  Patient started to have altered mentations yesterday and threw up x 1 during dialysis.  Overnight, patient became more confused.  According to daughter, patient's G-tube insertion site has a possible infected as she has been seeing more yellow-greenish pus coming out from around the G-tube entrance and she had to change the gauze of the G-tube 2 times a day to clean the past.  However patient does have a visiting wound care nurse seeing her 2 weeks sick day and according to the wound care team, the G-tube site was not infected.  In addition, daughter also reported that for the last 2 HD sessions, the outpatient HD team reported that the PermCath on left chest " has some narrowing and blood flow very slow" and patient was scheduled to have PermCath exchanged next week.  Patient denied any fever chills no diarrhea no abdominal pain or chest pain.  ED Course: Blood pressure borderline low borderline bradycardia afebrile, O2 saturation 100% room air.  Workup showed WBC 12.6, AST 139, ALT 135, total bilirubin 0.9.  BUN 52, creatinine 3.2, K4.5.  Patient received 1 L of IV fluids and vancomycin and cefepime.  CT chest showed early  inflammatory changes on bilateral lower lungs.  CT abdomen pelvis showed no acute findings, diverticulosis without signs of acute diverticulitis.  Review of Systems: Unable to perform, patient is confused.  Past Medical History:  Diagnosis Date   AKI (acute kidney injury) (HCC)    a. 04/2021 in setting of bacteremia/shock.   Arthritis    Asthma    Bacteremia    a. 04/2021 S pyogenes bacteremia in setting of lower ext cellulitis.   COPD (chronic obstructive pulmonary disease) (HCC)    Diabetes mellitus without complication (HCC)    Endometriosis    GERD (gastroesophageal reflux disease)    History of echocardiogram    a. 07/2013 Echo: EF 55-60%, impaired relaxation, mild TR; b. 04/2021 Echo: EF 50-55%, mild LVH, nl RV fxn, mild BAE, Ao sclerosis w/o stenosis.   Hypertension    Obesity    PAF (paroxysmal atrial fibrillation) (HCC)    a. 04/2021 in setting of septic shock/cellulitis.   Sleep apnea    CPAP    Past Surgical History:  Procedure Laterality Date   ABDOMINAL HYSTERECTOMY     APPLICATION OF WOUND VAC Left 05/17/2021   Procedure: APPLICATION OF WOUND VAC/WOUND VAC EXCHANGE-Matrix Myriad;  Surgeon: Carolan Shiver, MD;  Location: [facility] ORS;  Service: General;  Laterality: Left;   APPLICATION OF WOUND VAC  05/24/2021   Procedure: APPLICATION OF WOUND VAC;  Surgeon: Carolan Shiver, MD;  Location: [facility] ORS;  Service: General;;   APPLICATION OF WOUND VAC  05/10/2021   Procedure: APPLICATION OF WOUND VAC;  Surgeon: Carolan Shiver, MD;  Location: [facility] ORS;  Service: General;;   COLONOSCOPY  10/29/2006   Dr Servando Snare   COLONOSCOPY WITH PROPOFOL N/A 11/05/2016   Procedure: COLONOSCOPY WITH PROPOFOL;  Surgeon: Earline Mayotte, MD;  Location: [facility] ENDOSCOPY;  Service: Endoscopy;  Laterality: N/A;   DIALYSIS/PERMA CATHETER INSERTION N/A 12/19/2022   Procedure: DIALYSIS/PERMA CATHETER INSERTION;  Surgeon: Annice Needy, MD;  Location: [facility] INVASIVE CV LAB;  Service:  Cardiovascular;  Laterality: N/A;   DIALYSIS/PERMA CATHETER INSERTION N/A 02/04/2023   Procedure: DIALYSIS/PERMA CATHETER INSERTION;  Surgeon: Annice Needy, MD;  Location: [facility] INVAS[facility] CV [facility];  Service: Cardiovascular;  Laterality: N/A;   INCISION AND DRAINAGE OF WOUND Left 05/24/2021   Procedure: IRRIGATION AND DEBRIDEMENT LEFT LEG;  Surgeon: Carolan Shiver, MD;  Location: [facility] ORS;  Service: General;  Laterality: Left;   INCISION AND DRAINAGE OF WOUND Left 05/10/2021   Procedure: IRRIGATION AND DEBRIDEMENT LEFT LEG;  Surgeon: Carolan Shiver, MD;  Location: [facility] ORS;  Service: General;  Laterality: Left;   IR FLUORO GUIDE CV LINE RIGHT  06/12/2021   IR PERC TUN PERIT CATH WO PORT S&I /IMAG  06/12/2021   IR REPLC GASTRO/COLONIC TUBE PERCUT W/FLUORO  06/12/2021   IVC FILTER INSERTION N/A 05/29/2021   Procedure: IVC FILTER INSERTION;  Surgeon: Renford Dills, MD;  Location: [facility] CV LAB;  Service: Cardiovascular;  Laterality: N/A;   MINOR GRAFT APPLICATION  05/24/2021   Procedure: Myriad Matrix  APPLICATION;  Surgeon: Carolan Shiver, MD;  Location: [facility] OR[facility];  Service: General;;   NASAL SINUS SURGERY  2002   Dr Chestine Spore   PEG PLACEMENT N/A 05/28/2021   Procedure: PERCUTANEOUS ENDOSCOPIC GASTROSTOMY (PEG) PLACEMENT;  Surgeon: Sung Amabile, DO;  Location: [facility] ENDOSCOPY;  Service: General;  Laterality: N/A;  TRAVEL CASE   TRACHEOSTOMY TUBE PLACEMENT N/A 05/17/2021   Procedure: TRACHEOSTOMY;  Surgeon: Geanie Logan, MD;  Location: [facility] ORS;  Service: ENT;  Laterality: N/A;   WOUND DEBRIDEMENT Left 05/07/2021   Procedure: DEBRIDEMENT WOUND;  Surgeon: Carolan Shiver, MD;  Location: [facility] ORS;  Service: General;  Laterality: Left;     reports that she quit smoking about 21 years ago. Her smoking use included cigarettes. She started smoking about 61 years ago. She has a 80 pack-year smoking history. She quit smokeless tobacco use about 21 years ago.  Her smokeless tobacco use  included snuff. She reports that she does not drink alcohol and does not use drugs.  No Known Allergies  Family History  Problem Relation Age of Onset   Congestive Heart Failure Mother    Coronary artery disease Father 17   Breast cancer Sister 36   Heart disease Brother    Varicose Veins Brother    Alcohol abuse Brother      Prior to Admission medications   Medication Sig Start Date End Date Taking? Authorizing Provider  Accu-Chek Softclix Lancets lancets 1 each by Other route 4 (four) times daily -  before meals and at bedtime. Use before meals and at bedtime for blood glucose monitoring 01/20/23 02/19/23  [name], MD  acetaminophen (TYLENOL) 325 MG tablet Place 2 tablets (650 mg total) into feeding tube every 6 (six) hours as needed for mild pain (or Fever >/= 101). 12/29/22   Alford Highland, MD  amiodarone (PACERONE) 200 MG tablet Place 1 tablet (200 mg total) into feeding tube 2 (two) times daily. 02/12/23   [name], MD  ascorbic acid (VITAMIN C) 500 MG tablet Place 1 tablet (500 mg total) into feeding  tube 2 (two) times daily. 06/13/21   Erin Fulling, MD  atorvastatin (LIPITOR) 20 MG tablet Place 1 tablet (20 mg total) into feeding tube daily. 02/12/23   [name], MD  Blood Glucose Monitoring Suppl DEVI 1 each by Does not apply route in the morning, at noon, and at bedtime. May substitute to any manufacturer covered by patient's insurance. 01/20/23   [name], MD  cholestyramine Lanetta Inch) 4 g packet Place 1 packet (4 g total) into feeding tube 3 (three) times daily. 02/11/23   [name], MD  clonazePAM (KLONOPIN) 0.5 MG tablet Take 0.5-1 tablets (0.25-0.5 mg total) by mouth daily as needed for anxiety. 02/11/23   [name], MD  DAPTOmycin 500 mg in sodium chloride 0.9 % 50 mL Inject 500 mg into the vein Every Tuesday,Thursday,and Saturday with dialysis. Stop after 01/03/23 dose (given with dialysis) 12/30/22   Alford Highland, MD   diphenoxylate-atropine (LOMOTIL) 2.5-0.025 MG tablet 1 tablet by Per J Tube route 4 (four) times daily as needed for diarrhea or loose stools. 12/29/22   Alford Highland, MD  escitalopram (LEXAPRO) 10 MG tablet Place 1 tablet (10 mg total) into feeding tube daily. 02/11/23   [name], MD  famotidine (PEPCID) 10 MG tablet Place 1 tablet (10 mg total) into feeding tube daily. 02/11/23   [name], MD  fludrocortisone (FLORINEF) 0.1 MG tablet Place 2 tablets (0.2 mg total) into feeding tube daily. 12/30/22   Wieting, Richard, MD  Glucose Blood (BLOOD GLUCOSE TEST STRIPS) STRP 1 each by In Vitro route 4 (four) times daily -  before meals and at bedtime. May substitute to any manufacturer covered by patient's insurance. 01/20/23 02/19/23  [name], MD  hydrOXYzine (ATARAX) 50 MG tablet Take 1 tablet (50 mg total) by mouth 3 (three) times daily as needed. 02/11/23   [name], MD  metoCLOPramide (REGLAN) 5 MG tablet Take 1 tablet (5 mg total) by mouth 3 (three) times daily before meals. 02/11/23   [name], MD  midodrine (PROAMATINE) 10 MG tablet Place 2 tablets (20 mg total) into feeding tube 3 (three) times daily with meals. 02/12/23   [name], MD  multivitamin (RENA-VIT) TABS tablet Place 1 tablet into feeding tube at bedtime. 02/04/23   [name], MD  Nutritional Supplements (FEEDING SUPPLEMENT, NEPRO CARB STEADY,) LIQD Place 1,120 mLs into feeding tube daily. 12/29/22   Alford Highland, MD  Nutritional Supplements (FEEDING SUPPLEMENT, NEPRO CARB STEADY,) LIQD Place 1,120 mLs into feeding tube daily. 01/02/23   Enedina Finner, MD  nystatin (MYCOSTATIN/NYSTOP) powder Apply topically 3 (three) times daily. 12/29/22   Alford Highland, MD  polyvinyl alcohol (LIQUIFILM TEARS) 1.4 % ophthalmic solution Place 1 drop into both eyes as needed for dry eyes. 12/29/22   Alford Highland, MD  traZODone (DESYREL) 50 MG tablet Take 0.5 tablets (25 mg total) by mouth at bedtime as  needed for sleep. 02/11/23   [name], MD  vitamin D3 (CHOLECALCIFEROL) 25 MCG tablet Place 2 tablets (2,000 Units total) into feeding tube daily. 01/02/23   Enedina Finner, MD  Water For Irrigation, Sterile (FREE WATER) SOLN Place 50 mLs into feeding tube every 4 (four) hours. 12/29/22   Alford Highland, MD    Physical Exam: Vitals:   02/13/23 0810 02/13/23 0930 02/13/23 1300 02/13/23 1304  BP:  114/73 (!) 128/58   Pulse:  (!) 54 61   Resp:  13 (!) 28   Temp: (!) 97.1 F (36.2 C)   98 F (36.7 C)  TempSrc:    Axillary  SpO2:  98% (!) 83%   Weight:      Height:        Constitutional: NAD, calm, comfortable Vitals:   02/13/23 0810 02/13/23 0930 02/13/23 1300 02/13/23 1304  BP:  114/73 (!) 128/58   Pulse:  (!) 54 61   Resp:  13 (!) 28   Temp: (!) 97.1 F (36.2 C)   98 F (36.7 C)  TempSrc:    Axillary  SpO2:  98% (!) 83%   Weight:      Height:       Eyes: PERRL, lids and conjunctivae normal ENMT: Mucous membranes are moist. Posterior pharynx clear of any exudate or lesions.Normal dentition.  Neck: normal, supple, no masses, no thyromegaly Respiratory: clear to auscultation bilaterally, no wheezing, bilateral crackles and increasing respiratory effort. No accessory muscle use.  Cardiovascular: Regular rate and rhythm, no murmurs / rubs / gallops. No extremity edema. 2+ pedal pulses. No carotid bruits.  Abdomen: no tenderness, no masses palpated. No hepatosplenomegaly. Bowel sounds positive.  Musculoskeletal: no clubbing / cyanosis. No joint deformity upper and lower extremities. Good ROM, no contractures. Normal muscle tone.  Skin: Checked the HD catheter insertion entrance site appeared to be clean, no discharge or bleeding.  Surrounding the HD catheter entrance there was no rash or swelling or tenderness.  Around the G-tube entrance site there is thin colored secretion, nontender none rash no undulation Neurologic: CN 2-12 grossly intact. Sensation intact, DTR normal.  Strength 5/5 in all 4.  Psychiatric: Awake, drowsy, confused    Labs on Admission: I have personally reviewed following labs and imaging studies  CBC: Recent Labs  Lab 02/13/23 0629  WBC 12.6*  NEUTROABS 6.7  HGB 13.1  HCT 43.5  MCV 85.8  PLT 297   Basic Metabolic Panel: Recent Labs  Lab 02/13/23 0438  NA 136  K 4.5  CL 105  CO2 20*  GLUCOSE 109*  BUN 52*  CREATININE 3.22*  CALCIUM 8.7*   GFR: Estimated Creatinine Clearance: 16.3 mL/min (A) (by C-G formula based on SCr of 3.22 mg/dL (H)). Liver Function Tests: Recent Labs  Lab 02/11/23 1455 02/13/23 0438  AST 144* 139*  ALT 140* 135*  ALKPHOS  --  199*  BILITOT 0.4 0.9  PROT 7.8 7.6  ALBUMIN  --  2.7*   No results for input(s): "LIPASE", "AMYLASE" in the last 168 hours. No results for input(s): "AMMONIA" in the last 168 hours. Coagulation Profile: Recent Labs  Lab 02/13/23 0438  INR 1.2   Cardiac Enzymes: No results for input(s): "CKTOTAL", "CKMB", "CKMBINDEX", "TROPONINI" in the last 168 hours. BNP (last 3 results) No results for input(s): "PROBNP" in the last 8760 hours. HbA1C: Recent Labs    02/11/23 1455  HGBA1C 4.7   CBG: No results for input(s): "GLUCAP" in the last 168 hours. Lipid Profile: Recent Labs    02/11/23 1455  CHOL 64  HDL 27*  LDLCALC 19  TRIG 98  CHOLHDL 2.4   Thyroid Function Tests: Recent Labs    02/11/23 1455  TSH 3.06   Anemia Panel: No results for input(s): "VITAMINB12", "FOLATE", "FERRITIN", "TIBC", "IRON", "RETICCTPCT" in the last 72 hours. Urine analysis:    Component Value Date/Time   COLORURINE AMBER (A) 02/13/2023 0629   APPEARANCEUR CLOUDY (A) 02/13/2023 0629   LABSPEC 1.016 02/13/2023 0629   PHURINE 5.0 02/13/2023 0629   GLUCOSEU NEGATIVE 02/13/2023 0629   HGBUR NEGATIVE 02/13/2023 0629   BILIRUBINUR NEGATIVE 02/13/2023  2956   KETONESUR NEGATIVE 02/13/2023 0629   PROTEINUR 30 (A) 02/13/2023 0629   NITRITE NEGATIVE 02/13/2023 0629    LEUKOCYTESUR LARGE (A) 02/13/2023 0629    Radiological Exams on Admission: CT CHEST ABDOMEN PELVIS WO CONTRAST  Result Date: 02/13/2023 CLINICAL DATA:  Sepsis.  Mental status changes. EXAM: CT CHEST, ABDOMEN AND PELVIS WITHOUT CONTRAST TECHNIQUE: Multidetector CT imaging of the chest, abdomen and pelvis was performed following the standard protocol without IV contrast. RADIATION DOSE REDUCTION: This exam was performed according to the departmental dose-optimization program which includes automated exposure control, adjustment of the mA and/or kV according to patient size and/or use of iterative reconstruction technique. COMPARISON:  12/10/2022 FINDINGS: CT CHEST FINDINGS Cardiovascular: The heart size is normal. No substantial pericardial effusion. Left-sided central line tip is positioned in the right atrium Mediastinum/Nodes: No mediastinal lymphadenopathy. No evidence for gross hilar lymphadenopathy although assessment is limited by the lack of intravenous contrast on the current study. The esophagus has normal imaging features. There is no axillary lymphadenopathy. Lungs/Pleura: Architectural distortion is noted in the lungs bilaterally, right greater than left. Probable superimposed atelectasis in the dependent bases. Clustered nodular airspace disease in the anterior right lung (image 60/4) is new in the interval. No dense focal airspace consolidation. No pleural effusion. Musculoskeletal: No worrisome lytic or sclerotic osseous abnormality. CT ABDOMEN PELVIS FINDINGS Hepatobiliary: Nodular liver contour compatible cirrhosis. Calcified gallstones evident. No intrahepatic or extrahepatic biliary dilation. Pancreas: No focal mass lesion. No dilatation of the main duct. No intraparenchymal cyst. No peripancreatic edema. Spleen: No splenomegaly. No suspicious focal mass lesion. Adrenals/Urinary Tract: No adrenal nodule or mass. Nonobstructing stones noted right kidney. Left kidney unremarkable. No evidence  for hydroureter. The urinary bladder appears normal for the degree of distention. Stomach/Bowel: Gastrostomy tube evident. There is gas along the course of the gastrostomy tube which may be anterior wall of the stomach herniating into the defect in the rectus sheath for passage of the tube (see coronal 67 and 68 of series 6 and axial 60/2) although a small extraluminal focus of gas related to the G-tube tract could also have this appearance. No focal rim enhancing fluid collection to suggest abscess. Duodenum is normally positioned as is the ligament of Treitz. No small bowel wall thickening. No small bowel dilatation. The terminal ileum is normal. The appendix is normal. No gross colonic mass. No colonic wall thickening. Diverticular changes are noted in the left colon without evidence of diverticulitis. Vascular/Lymphatic: There is mild atherosclerotic calcification of the abdominal aorta without aneurysm. IVC filter visualized. There is no gastrohepatic or hepatoduodenal ligament lymphadenopathy. No retroperitoneal or mesenteric lymphadenopathy. No pelvic sidewall lymphadenopathy. Reproductive: There is no adnexal mass. Other: No intraperitoneal free fluid. Musculoskeletal: No worrisome lytic or sclerotic osseous abnormality. IMPRESSION: 1. Clustered nodular airspace disease in the anterior right lung is new in the interval, compatible with an infectious/inflammatory process. 2. Gastrostomy tube evident. There is gas along the course of the gastrostomy tube which may be anterior wall of the stomach herniating into the rectus sheath defect for the G tube although a small extraluminal focus of gas in the G-tube tract could also have this appearance. No focal rim enhancing fluid collection to suggest abscess. 3. Cirrhosis. 4. Cholelithiasis. 5. Nonobstructing right renal stones. 6. Left colonic diverticulosis without diverticulitis. 7.  Aortic Atherosclerosis (ICD10-I70.0). Electronically Signed   By: Kennith Center  M.D.   On: 02/13/2023 06:59   CT Head Wo Contrast  Result Date: 02/13/2023 CLINICAL DATA:  Mental  status change with unknown cause EXAM: CT HEAD WITHOUT CONTRAST TECHNIQUE: Contiguous axial images were obtained from the base of the skull through the vertex without intravenous contrast. RADIATION DOSE REDUCTION: This exam was performed according to the departmental dose-optimization program which includes automated exposure control, adjustment of the mA and/or kV according to patient size and/or use of iterative reconstruction technique. COMPARISON:  Brain MRI 12/07/2022 FINDINGS: Brain: No evidence of acute infarction, hemorrhage, hydrocephalus, extra-axial collection or mass lesion/mass effect. Expanded and low-density sella, usually incidental in isolation. Generalized atrophy. Vascular: No hyperdense vessel or unexpected calcification. Skull: Normal. Negative for fracture or focal lesion. Sinuses/Orbits: No acute finding. IMPRESSION: No acute or interval finding. Electronically Signed   By: Tiburcio Pea M.D.   On: 02/13/2023 06:25    EKG: Independently reviewed.  Sinus rhythm, prolonged QTc  Assessment/Plan Principal Problem:   Sepsis (HCC) Active Problems:   Aspiration pneumonia (HCC)  (please populate well all problems here in Problem List. (For example, if patient is on BP meds at home and you resume or decide to hold them, it is a problem that needs to be her. Same for CAD, COPD, HLD and so on)  Severe sepsis -Evidenced by leukocytosis, hypotension requiring IV bolus, with symptoms and signs of endorgan damage of acute metabolic encephalopathy and source of infection less clear. -Daughter reported the patient might have a infection at the G-tube insertion entrance site.  Physical exam however does not support active infection or cellulitis on the abdominal wall.  HD PermCath insertion site appears to be clean.  Blood culture sent to rule out bacteremia. -Will continue empiric coverage for  sepsis with vancomycin and cefepime.  Review of most recent blood culture in August when patient had bacteremia showed vancomycin sensitive MRSA and Enterococcus. -Other source recent infection, possibly patient also had aspiration as she was witnessed to have nauseous vomiting yesterday and patient does cough up clear phlegm in the ED as well. -Start stress dose of hydrocortisone -Case was discussed with ICU attending Dr. Aundria Rud  Acute metabolic encephalopathy -Secondary to sepsis, manage sepsis then reevaluate -Will check ammonium level as she has underlying cirrhosis  Acute transaminitis -Without concurrent bilirubinemia, probably related to sepsis, recheck LFT tomorrow  ESRD on HD with access difficulties -Case was discussed with on-call nephrology Dr. Wynelle Link.  Made nephrology aware that patient has been having access problems. -Expect early HD tonight versus tomorrow morning  PAF -Hold off amiodarone as BP running low. -Not on anticoagulation, reason unclear  G-tube Severe dysphagia -N.p.o. -Plan to hold off G-tube feeding tonight until sepsis stabilized. -Start PPI for GI prophylaxis  Chronic adrenal insufficiency -Change home dose of Cortef to stress dose of hydrocortisone -Continue midodrine  Prolonged Qtc -Recheck EKG tomorrow  OSA -CPAP HS  DVT prophylaxis: Heparin subcu Code Status: Full code Family Communication: Daughter over the phone and sister at bedside Disposition Plan: Patient is sick with severe sepsis requiring IV fluid, IV antibiotics, expect more than 2 midnight hospital stay Consults called: Critical care, nephrology Admission status: Stepdown unit   Emeline General MD [facility] Pager [phone]  02/13/2023, 3:23 PM

## 2023-02-13 NOTE — Consult Note (Signed)
Pharmacy Antibiotic Note  Alexandra Foster is a 72 y.o. female admitted on 02/13/2023 with  altered mental status .  Pharmacy has been consulted for Vancomycin and Zosyn dosing.  Plan: Vancomycin 2g IV total x 1 as loading dose Patient receives HD outpatient. Will await nephrology plan and then order Vancomycin 1g IV to be given at the end of each dialysis session.  Zosyn 2.25g IV Q8 hours(dialysis dosing)  Height: 5\' 4"  (162.6 cm) Weight: 86 kg (189 lb 9.5 oz) IBW/kg (Calculated) : 54.7  Temp (24hrs), Avg:97.6 F (36.4 C), Min:97.1 F (36.2 C), Max:98 F (36.7 C)  Recent Labs  Lab 02/13/23 0438 02/13/23 0629  WBC  --  12.6*  CREATININE 3.22*  --   LATICACIDVEN 1.7 1.3    Estimated Creatinine Clearance: 16.3 mL/min (A) (by C-G formula based on SCr of 3.22 mg/dL (H)).    No Known Allergies  Antimicrobials this admission: Vancomycin 11/1 >>  Zosyn 11/1 >>  Cefepime/Azithro x 1  Dose adjustments this admission: HD dosing protocol of antibiotics being used  Microbiology results: 11/1 BCx: pending 11/1 UCx: pending  11/1 MRSA PCR: pending  Thank you for allowing pharmacy to be a part of this patient's care.  Harrell Lark A Bastien Strawser 02/13/2023 3:45 PM

## 2023-02-13 NOTE — Telephone Encounter (Signed)
Verba order given per Dr. Carlynn Purl' approval

## 2023-02-13 NOTE — Telephone Encounter (Signed)
Brandy with [facility] called stating that patient had a hard time dialyzing yesterday and that they had sent a referral yesterday (not received) and again this morning (not received) but that patient needed to get in to be seen. While speaking with Gearldine Bienenstock, I looked patient up and saw that patient was currently in the ED. After speaking with Sheppard Plumber, NP, I advised Gearldine Bienenstock to call the ED and alert them that the cath is not working for patient. I advised per Sheppard Plumber, NP, that the ED would contact someone from [facility] to consult. Gearldine Bienenstock acknowledged that she would call the ED.

## 2023-02-13 NOTE — ED Provider Notes (Signed)
72 yo F with h/o DM, asthma/COPD, ESRD, HTN, AFib here with AMS. Pt has h/o UTIs with similar presentations. Last full HD sessions Tuesday, had issue with catheter yesterday. Now here with UTI. Will admit.   Shaune Pollack, MD 02/13/23 1242

## 2023-02-13 NOTE — ED Notes (Signed)
Pt returned from CT °

## 2023-02-14 DIAGNOSIS — R6521 Severe sepsis with septic shock: Secondary | ICD-10-CM | POA: Diagnosis not present

## 2023-02-14 DIAGNOSIS — Z515 Encounter for palliative care: Secondary | ICD-10-CM

## 2023-02-14 DIAGNOSIS — N186 End stage renal disease: Secondary | ICD-10-CM | POA: Diagnosis not present

## 2023-02-14 DIAGNOSIS — A419 Sepsis, unspecified organism: Secondary | ICD-10-CM | POA: Diagnosis present

## 2023-02-14 DIAGNOSIS — G9340 Encephalopathy, unspecified: Secondary | ICD-10-CM

## 2023-02-14 LAB — CBC
HCT: 42.9 % (ref 36.0–46.0)
Hemoglobin: 13.4 g/dL (ref 12.0–15.0)
MCH: 26.5 pg (ref 26.0–34.0)
MCHC: 31.2 g/dL (ref 30.0–36.0)
MCV: 85 fL (ref 80.0–100.0)
Platelets: 284 10*3/uL (ref 150–400)
RBC: 5.05 MIL/uL (ref 3.87–5.11)
RDW: 15 % (ref 11.5–15.5)
WBC: 32.9 10*3/uL — ABNORMAL HIGH (ref 4.0–10.5)
nRBC: 0 % (ref 0.0–0.2)

## 2023-02-14 LAB — BLOOD GAS, VENOUS
Acid-base deficit: 13.1 mmol/L — ABNORMAL HIGH (ref 0.0–2.0)
Acid-base deficit: 6.7 mmol/L — ABNORMAL HIGH (ref 0.0–2.0)
Acid-base deficit: 4.3 mmol/L — ABNORMAL HIGH (ref 0.0–2.0)
Bicarbonate: 14.6 mmol/L — ABNORMAL LOW (ref 20.0–28.0)
Bicarbonate: 19.2 mmol/L — ABNORMAL LOW (ref 20.0–28.0)
Bicarbonate: 21.6 mmol/L (ref 20.0–28.0)
O2 Saturation: 80.3 %
O2 Saturation: 85.7 %
O2 Saturation: 87 %
Patient temperature: 37
Patient temperature: 37
Patient temperature: 37
pCO2, Ven: 39 mm[Hg] — ABNORMAL LOW (ref 44–60)
pCO2, Ven: 39 mm[Hg] — ABNORMAL LOW (ref 44–60)
pCO2, Ven: 42 mm[Hg] — ABNORMAL LOW (ref 44–60)
pH, Ven: 7.18 — CL (ref 7.25–7.43)
pH, Ven: 7.3 (ref 7.25–7.43)
pH, Ven: 7.32 (ref 7.25–7.43)
pO2, Ven: 49 mm[Hg] — ABNORMAL HIGH (ref 32–45)
pO2, Ven: 52 mm[Hg] — ABNORMAL HIGH (ref 32–45)
pO2, Ven: 53 mm[Hg] — ABNORMAL HIGH (ref 32–45)

## 2023-02-14 LAB — CBC WITH DIFFERENTIAL/PLATELET
Abs Immature Granulocytes: 0.37 10*3/uL — ABNORMAL HIGH (ref 0.00–0.07)
Basophils Absolute: 0 10*3/uL (ref 0.0–0.1)
Basophils Relative: 0 %
Eosinophils Absolute: 0.1 10*3/uL (ref 0.0–0.5)
Eosinophils Relative: 0 %
HCT: 47.7 % — ABNORMAL HIGH (ref 36.0–46.0)
Hemoglobin: 14.2 g/dL (ref 12.0–15.0)
Immature Granulocytes: 1 %
Lymphocytes Relative: 1 %
Lymphs Abs: 0.3 10*3/uL — ABNORMAL LOW (ref 0.7–4.0)
MCH: 25.9 pg — ABNORMAL LOW (ref 26.0–34.0)
MCHC: 29.8 g/dL — ABNORMAL LOW (ref 30.0–36.0)
MCV: 86.9 fL (ref 80.0–100.0)
Monocytes Absolute: 0.4 10*3/uL (ref 0.1–1.0)
Monocytes Relative: 1 %
Neutro Abs: 32 10*3/uL — ABNORMAL HIGH (ref 1.7–7.7)
Neutrophils Relative %: 97 %
Platelets: 283 10*3/uL (ref 150–400)
RBC: 5.49 MIL/uL — ABNORMAL HIGH (ref 3.87–5.11)
RDW: 15.1 % (ref 11.5–15.5)
WBC: 33.2 10*3/uL — ABNORMAL HIGH (ref 4.0–10.5)
nRBC: 0 % (ref 0.0–0.2)

## 2023-02-14 LAB — LACTIC ACID, PLASMA
Lactic Acid, Venous: 4.8 mmol/L (ref 0.5–1.9)
Lactic Acid, Venous: 5.5 mmol/L (ref 0.5–1.9)
Lactic Acid, Venous: 3.8 mmol/L (ref 0.5–1.9)
Lactic Acid, Venous: 4.6 mmol/L (ref 0.5–1.9)

## 2023-02-14 LAB — COMPREHENSIVE METABOLIC PANEL
ALT: 133 U/L — ABNORMAL HIGH (ref 0–44)
AST: 127 U/L — ABNORMAL HIGH (ref 15–41)
Albumin: 2 g/dL — ABNORMAL LOW (ref 3.5–5.0)
Alkaline Phosphatase: 112 U/L (ref 38–126)
Anion gap: 17 — ABNORMAL HIGH (ref 5–15)
BUN: 58 mg/dL — ABNORMAL HIGH (ref 8–23)
CO2: 16 mmol/L — ABNORMAL LOW (ref 22–32)
Calcium: 8.4 mg/dL — ABNORMAL LOW (ref 8.9–10.3)
Chloride: 106 mmol/L (ref 98–111)
Creatinine, Ser: 3.94 mg/dL — ABNORMAL HIGH (ref 0.44–1.00)
GFR, Estimated: 11 mL/min — ABNORMAL LOW (ref >60)
Glucose, Bld: 174 mg/dL — ABNORMAL HIGH (ref 70–99)
Potassium: 3.9 mmol/L (ref 3.5–5.1)
Sodium: 139 mmol/L (ref 135–145)
Total Bilirubin: 0.9 mg/dL (ref 0.3–1.2)
Total Protein: 5.8 g/dL — ABNORMAL LOW (ref 6.5–8.1)

## 2023-02-14 LAB — GLUCOSE, CAPILLARY
Glucose-Capillary: 190 mg/dL — ABNORMAL HIGH (ref 70–99)
Glucose-Capillary: 159 mg/dL — ABNORMAL HIGH (ref 70–99)
Glucose-Capillary: 140 mg/dL — ABNORMAL HIGH (ref 70–99)
Glucose-Capillary: 154 mg/dL — ABNORMAL HIGH (ref 70–99)

## 2023-02-14 LAB — AMMONIA: Ammonia: 23 umol/L (ref 9–35)

## 2023-02-14 LAB — URINE CULTURE

## 2023-02-14 LAB — CBG MONITORING, ED
Glucose-Capillary: 157 mg/dL — ABNORMAL HIGH (ref 70–99)
Glucose-Capillary: 164 mg/dL — ABNORMAL HIGH (ref 70–99)

## 2023-02-14 LAB — MRSA NEXT GEN BY PCR, NASAL: MRSA by PCR Next Gen: DETECTED — AB

## 2023-02-14 LAB — MAGNESIUM: Magnesium: 1.9 mg/dL (ref 1.7–2.4)

## 2023-02-14 MED ORDER — SODIUM BICARBONATE 8.4 % IV SOLN
100.0000 meq | Freq: Once | INTRAVENOUS | Status: AC
  Administered 2023-02-14: 100 meq via INTRAVENOUS
  Filled 2023-02-14: qty 50

## 2023-02-14 MED ORDER — WHITE PETROLATUM EX OINT
TOPICAL_OINTMENT | CUTANEOUS | Status: DC | PRN
  Filled 2023-02-14: qty 5

## 2023-02-14 MED ORDER — CHLORHEXIDINE GLUCONATE CLOTH 2 % EX PADS
6.0000 | MEDICATED_PAD | Freq: Every day | CUTANEOUS | Status: DC
  Administered 2023-02-15 – 2023-02-20 (×6): 6 via TOPICAL

## 2023-02-14 MED ORDER — SODIUM CHLORIDE 0.9 % IV SOLN
250.0000 mL | INTRAVENOUS | Status: DC
  Administered 2023-02-14: 250 mL via INTRAVENOUS

## 2023-02-14 MED ORDER — HYDROCORTISONE SOD SUC (PF) 100 MG IJ SOLR
100.0000 mg | Freq: Three times a day (TID) | INTRAMUSCULAR | Status: DC
  Administered 2023-02-14 – 2023-02-18 (×10): 100 mg via INTRAVENOUS
  Filled 2023-02-14 (×10): qty 2

## 2023-02-14 MED ORDER — HYDROXYZINE HCL 50 MG PO TABS
100.0000 mg | ORAL_TABLET | Freq: Two times a day (BID) | ORAL | Status: DC
  Administered 2023-02-14 – 2023-02-18 (×7): 100 mg via ORAL
  Filled 2023-02-14 (×6): qty 2
  Filled 2023-02-14: qty 4
  Filled 2023-02-14 (×2): qty 2

## 2023-02-14 MED ORDER — FLUCONAZOLE IN SODIUM CHLORIDE 400-0.9 MG/200ML-% IV SOLN
400.0000 mg | INTRAVENOUS | Status: DC
  Administered 2023-02-14 – 2023-02-16 (×3): 400 mg via INTRAVENOUS
  Filled 2023-02-14 (×3): qty 200

## 2023-02-14 MED ORDER — DEXTROSE IN LACTATED RINGERS 5 % IV SOLN: INTRAVENOUS | Status: DC

## 2023-02-14 MED ORDER — MUPIROCIN 2 % EX OINT
1.0000 | TOPICAL_OINTMENT | Freq: Two times a day (BID) | CUTANEOUS | Status: AC
  Administered 2023-02-14 – 2023-02-18 (×9): 1 via NASAL
  Filled 2023-02-14: qty 22

## 2023-02-14 MED ORDER — NOREPINEPHRINE 4 MG/250ML-% IV SOLN
2.0000 ug/min | INTRAVENOUS | Status: DC
  Administered 2023-02-14: 5 ug/min via INTRAVENOUS
  Administered 2023-02-14: 2 ug/min via INTRAVENOUS
  Administered 2023-02-15: 3 ug/min via INTRAVENOUS
  Filled 2023-02-14 (×3): qty 250

## 2023-02-14 MED ORDER — HYDROXYZINE HCL 25 MG PO TABS: 100.0000 mg | ORAL_TABLET | Freq: Two times a day (BID) | ORAL | Status: DC

## 2023-02-14 MED ORDER — LACTATED RINGERS IV BOLUS
500.0000 mL | Freq: Once | INTRAVENOUS | Status: AC
  Administered 2023-02-14: 500 mL via INTRAVENOUS

## 2023-02-14 MED ORDER — LIDOCAINE HCL (PF) 1 % IJ SOLN
30.0000 mL | Freq: Once | INTRAMUSCULAR | Status: AC
  Administered 2023-02-14: 30 mL
  Filled 2023-02-14: qty 30

## 2023-02-14 MED ORDER — ORAL CARE MOUTH RINSE
15.0000 mL | OROMUCOSAL | Status: DC | PRN
  Administered 2023-02-14 – 2023-02-15 (×2): 15 mL via OROMUCOSAL

## 2023-02-14 MED ORDER — ORAL CARE MOUTH RINSE
15.0000 mL | OROMUCOSAL | Status: DC
  Administered 2023-02-15 – 2023-02-20 (×18): 15 mL via OROMUCOSAL

## 2023-02-14 MED ORDER — HYDROXYZINE HCL 25 MG PO TABS
50.0000 mg | ORAL_TABLET | Freq: Once | ORAL | Status: AC
  Administered 2023-02-14: 50 mg via ORAL
  Filled 2023-02-14: qty 2

## 2023-02-14 MED ORDER — CHLORHEXIDINE GLUCONATE CLOTH 2 % EX PADS
6.0000 | MEDICATED_PAD | Freq: Every day | CUTANEOUS | Status: DC
  Filled 2023-02-14: qty 6

## 2023-02-14 MED ORDER — DIPHENHYDRAMINE HCL 50 MG/ML IJ SOLN
12.5000 mg | Freq: Four times a day (QID) | INTRAMUSCULAR | Status: DC | PRN
  Administered 2023-02-14: 12.5 mg via INTRAVENOUS
  Filled 2023-02-14: qty 1

## 2023-02-14 NOTE — Consult Note (Signed)
[facility]  ASSESSMENT / PLAN: 72 y.o. female with sepsis, recent left jugular tunneled dialysis catheter placement. Primary team requesting TDC removal and line holiday. Will plan to place temporary dialysis catheter tomorrow.   CHIEF COMPLAINT: sepsis, altered mental status  HISTORY OF PRESENT ILLNESS: Alexandra Foster is a 72 y.o. female with history of  ESRD on HD TTS, via a left chest PermCath, asthma/COPD, severe dysphagia status post G-tube, IIDM, HTN, left AKA, PAF, OSA on CPAP, cirrhosis, adrenal insufficiency on Cortef, brought in by family member for evaluation of sepsis.   She had a tunneled dialysis catheter placed by Dr. Wyn Quaker 02/04/23. Given her septic picture, I was asked to remove the catheter. The patient is in the ICU and awake, but mildly confused. Her daughter is at the bedside. I explained the rationale for removal. They are understanding.   Past Medical History:  Diagnosis Date   AKI (acute kidney injury) (HCC)    a. 04/2021 in setting of bacteremia/shock.   Arthritis    Asthma    Bacteremia    a. 04/2021 S pyogenes bacteremia in setting of lower ext cellulitis.   COPD (chronic obstructive pulmonary disease) (HCC)    Diabetes mellitus without complication (HCC)    Endometriosis    GERD (gastroesophageal reflux disease)    History of echocardiogram    a. 07/2013 Echo: EF 55-60%, impaired relaxation, mild TR; b. 04/2021 Echo: EF 50-55%, mild LVH, nl RV fxn, mild BAE, Ao sclerosis w/o stenosis.   Hypertension    Obesity    PAF (paroxysmal atrial fibrillation) (HCC)    a. 04/2021 in setting of septic shock/cellulitis.   Sleep apnea    CPAP    Past Surgical History:  Procedure Laterality Date   ABDOMINAL HYSTERECTOMY     APPLICATION OF WOUND VAC Left 05/17/2021   Procedure: APPLICATION OF WOUND VAC/WOUND VAC EXCHANGE-Matrix Myriad;  Surgeon: Carolan Shiver, MD;  Location: [facility];  Service: General;  Laterality: Left;   APPLICATION OF  WOUND VAC  05/24/2021   Procedure: APPLICATION OF WOUND VAC;  Surgeon: Carolan Shiver, MD;  Location: [facility];  Service: General;;   APPLICATION OF WOUND VAC  05/10/2021   Procedure: APPLICATION OF WOUND VAC;  Surgeon: Carolan Shiver, MD;  Location: [facility];  Service: General;;   COLONOSCOPY  10/29/2006   Dr Servando Snare   COLONOSCOPY WITH PROPOFOL N/A 11/05/2016   Procedure: COLONOSCOPY WITH PROPOFOL;  Surgeon: Earline Mayotte, MD;  Location: [facility];  Service: Endoscopy;  Laterality: N/A;   DIALYSIS/PERMA CATHETER INSERTION N/A 12/19/2022   Procedure: DIALYSIS/PERMA CATHETER INSERTION;  Surgeon: Annice Needy, MD;  Location: [facility];  Service: Cardiovascular;  Laterality: N/A;   DIALYSIS/PERMA CATHETER INSERTION N/A 02/04/2023   Procedure: DIALYSIS/PERMA CATHETER INSERTION;  Surgeon: Annice Needy, MD;  Location: [facility];  Service: Cardiovascular;  Laterality: N/A;   INCISION AND DRAINAGE OF WOUND Left 05/24/2021   Procedure: IRRIGATION AND DEBRIDEMENT LEFT LEG;  Surgeon: Carolan Shiver, MD;  Location: [facility];  Service: General;  Laterality: Left;   INCISION AND DRAINAGE OF WOUND Left 05/10/2021   Procedure: IRRIGATION AND DEBRIDEMENT LEFT LEG;  Surgeon: Carolan Shiver, MD;  Location: [facility];  Service: General;  Laterality: Left;   IR FLUORO GUIDE CV LINE RIGHT  06/12/2021   IR PERC TUN PERIT CATH WO PORT S&I /IMAG  06/12/2021   IR REPLC GASTRO/COLONIC TUBE PERCUT W/FLUORO  06/12/2021   IVC FILTER INSERTION N/A 05/29/2021  Procedure: IVC FILTER INSERTION;  Surgeon: Renford Dills, MD;  Location: [facility];  Service: Cardiovascular;  Laterality: N/A;   MINOR GRAFT APPLICATION  05/24/2021   Procedure: Myriad Matrix  APPLICATION;  Surgeon: Carolan Shiver, MD;  Location: [facility];  Service: General;;   NASAL SINUS SURGERY  2002   Dr Chestine Spore   PEG PLACEMENT N/A 05/28/2021   Procedure: PERCUTANEOUS ENDOSCOPIC GASTROSTOMY (PEG)  PLACEMENT;  Surgeon: Sung Amabile, DO;  Location: [facility];  Service: General;  Laterality: N/A;  TRAVEL CASE   TRACHEOSTOMY TUBE PLACEMENT N/A 05/17/2021   Procedure: TRACHEOSTOMY;  Surgeon: Geanie Logan, MD;  Location: [facility];  Service: ENT;  Laterality: N/A;   WOUND DEBRIDEMENT Left 05/07/2021   Procedure: DEBRIDEMENT WOUND;  Surgeon: Carolan Shiver, MD;  Location: [facility];  Service: General;  Laterality: Left;    Family History  Problem Relation Age of Onset   Congestive Heart Failure Mother    Coronary artery disease Father 26   Breast cancer Sister 21   Heart disease Brother    Varicose Veins Brother    Alcohol abuse Brother     Social History   Socioeconomic History   Marital status: Married    Spouse name: [name]   Number of children: 1   Years of education: 12   Highest education level: 12th grade  Occupational History    Employer: RETIRED  Tobacco Use   Smoking status: Former    Current packs/day: 0.00    Average packs/day: 2.0 packs/day for 40.0 years (80.0 ttl pk-yrs)    Types: Cigarettes    Start date: 1963    Quit date: 2003    Years since quitting: 21.8   Smokeless tobacco: Former    Types: Snuff    Quit date: 04/2001   Tobacco comments:    smoking cessation materials not required  Vaping Use   Vaping status: Never Used  Substance and Sexual Activity   Alcohol use: No    Alcohol/week: 0.0 standard drinks of alcohol   Drug use: No   Sexual activity: Not Currently  Other Topics Concern   Not on file  Social History Narrative   Not on file   Social Determinants of Health   Financial Resource Strain: Medium Risk (08/09/2020)   Overall Financial Resource Strain (CARDIA)    Difficulty of Paying Living Expenses: Somewhat hard  Food Insecurity: Patient Unable To Answer (12/06/2022)   Hunger Vital Sign    Worried About Running Out of Food in the Last Year: Patient unable to answer    Ran Out of Food in the Last Year: Patient unable to  answer  Transportation Needs: No Transportation Needs (12/06/2022)   PRAPARE - Administrator, Civil Service (Medical): No    Lack of Transportation (Non-Medical): No  Physical Activity: Inactive (08/09/2020)   Exercise Vital Sign    Days of Exercise per Week: 0 days    Minutes of Exercise per Session: 0 min  Stress: No Stress Concern Present (08/09/2020)   [employer] of Occupational Health - Occupational Stress Questionnaire    Feeling of Stress : Not at all  Social Connections: Moderately Integrated (08/09/2020)   Social Connection and Isolation Panel [NHANES]    Frequency of Communication with Friends and Family: More than three times a week    Frequency of Social Gatherings with Friends and Family: More than three times a week    Attends Religious Services: More than 4 times per year  Active Member of Clubs or Organizations: No    Attends Banker Meetings: Never    Marital Status: Married  Catering manager Violence: Patient Unable To Answer (12/06/2022)   Humiliation, Afraid, Rape, and Kick questionnaire    Fear of Current or Ex-Partner: Patient unable to answer    Emotionally Abused: Patient unable to answer    Physically Abused: Patient unable to answer    Sexually Abused: Patient unable to answer    No Known Allergies  Current Facility-Administered Medications  Medication Dose Route Frequency Provider Last Rate Last Admin   0.9 %  sodium chloride infusion  250 mL Intravenous Continuous Georgeann Oppenheim, Sudheer B, MD 10 mL/hr at 02/14/23 1500 Infusion Verify at 02/14/23 1500   acetaminophen (TYLENOL) tablet 650 mg  650 mg Per Tube Q6H PRN Mikey College T, MD   650 mg at 02/13/23 2330   atorvastatin (LIPITOR) tablet 20 mg  20 mg Per Tube Daily Mikey College T, MD   20 mg at 02/14/23 1108   budesonide (PULMICORT) nebulizer solution 0.25 mg  0.25 mg Nebulization BID Mikey College T, MD   0.25 mg at 02/14/23 0857   Chlorhexidine Gluconate Cloth 2 % PADS 6 each  6  each Topical Q0600 Manuela Schwartz, NP       Melene Muller ON 02/15/2023] Chlorhexidine Gluconate Cloth 2 % PADS 6 each  6 each Topical Q0600 Sreenath, Sudheer B, MD       cholestyramine (QUESTRAN) packet 4 g  4 g Per Tube TID Mikey College T, MD   4 g at 02/14/23 1241   clonazePAM (KLONOPIN) tablet 0.5 mg  0.5 mg Oral BID PRN Mikey College T, MD       dextrose 5 % in lactated ringers infusion   Intravenous Continuous Manuela Schwartz, NP 40 mL/hr at 02/14/23 0805 Restarted at 02/14/23 0805   diphenhydrAMINE (BENADRYL) injection 12.5 mg  12.5 mg Intravenous Q6H PRN Lolita Patella B, MD   12.5 mg at 02/14/23 1438   diphenoxylate-atropine (LOMOTIL) 2.5-0.025 MG per tablet 1 tablet  1 tablet Per J Tube QID PRN Mikey College T, MD       escitalopram (LEXAPRO) tablet 10 mg  10 mg Per Tube Daily Mikey College T, MD   10 mg at 02/14/23 1108   fluconazole (DIFLUCAN) IVPB 400 mg  400 mg Intravenous Q24H Otelia Sergeant, RPH   Stopped at 02/14/23 0852   heparin injection 5,000 Units  5,000 Units Subcutaneous Q12H Mikey College T, MD   5,000 Units at 02/14/23 1119   hydrocortisone sodium succinate (SOLU-CORTEF) 100 MG injection 100 mg  100 mg Intravenous Q8H Sreenath, Sudheer B, MD   100 mg at 02/14/23 1505   hydrOXYzine (ATARAX) tablet 50 mg  50 mg Oral Q12H Mikey College T, MD   50 mg at 02/14/23 1101   ipratropium-albuterol (DUONEB) 0.5-2.5 (3) MG/3ML nebulizer solution 3 mL  3 mL Nebulization Q6H Mikey College T, MD   3 mL at 02/14/23 1355   lidocaine (PF) (XYLOCAINE) 1 % injection 30 mL  30 mL Infiltration Once Leonie Douglas, MD       liver oil-zinc oxide (DESITIN) 40 % ointment   Topical TID PRN Emeline General, MD       metoCLOPramide (REGLAN) injection 5 mg  5 mg Intravenous Q6H PRN Mikey College T, MD       midodrine (PROAMATINE) tablet 20 mg  20 mg Oral TID WC Emeline General, MD   20 mg at  02/14/23 1105   mupirocin ointment (BACTROBAN) 2 % 1 Application  1 Application Nasal BID Manuela Schwartz, NP   1 Application at  02/14/23 1121   norepinephrine (LEVOPHED) 4mg  in (0.016 mg/mL) premix infusion  2-10 mcg/min Intravenous Titrated Raechel Chute, MD 15 mL/hr at 02/14/23 1500 4 mcg/min at 02/14/23 1500   pantoprazole (PROTONIX) injection 40 mg  40 mg Intravenous Q24H Mikey College T, MD   40 mg at 02/14/23 1436   piperacillin-tazobactam (ZOSYN) IVPB 2.25 g  2.25 g Intravenous Q8H Mila Merry A, RPH   Stopped at 02/14/23 1453   polyvinyl alcohol (LIQUIFILM TEARS) 1.4 % ophthalmic solution 1 drop  1 drop Both Eyes PRN Mikey College T, MD       traZODone (DESYREL) tablet 25 mg  25 mg Oral QHS PRN Mikey College T, MD       white petrolatum (VASELINE) gel   Topical PRN Lolita Patella B, MD        PHYSICAL EXAM Vitals:   02/14/23 1430 02/14/23 1445 02/14/23 1505 02/14/23 1515  BP: (!) 110/95 (!) 132/115 (!) 81/62 101/70  Pulse: 75 77 72 72  Resp: (!) 21 19 19  (!) 24  Temp:      TempSrc:      SpO2: 95% 92% 93% 93%  Weight:      Height:       Chronically ill elderly woman in no distress Regular rate and rhythm Unlabored breathing LIJ TDC in place  PERTINENT LABORATORY AND RADIOLOGIC DATA  Most recent CBC    Latest Ref Rng & Units 02/14/2023    5:00 AM 02/13/2023    6:29 AM 02/13/2023   12:47 AM  CBC  WBC 4.0 - 10.5 K/uL 32.9  12.6  33.2   Hemoglobin 12.0 - 15.0 g/dL 56.2  13.0  86.5   Hematocrit 36.0 - 46.0 % 42.9  43.5  47.7   Platelets 150 - 400 K/uL 284  297  283      Most recent CMP    Latest Ref Rng & Units 02/14/2023    5:00 AM 02/13/2023    4:38 AM 02/11/2023    2:55 PM  CMP  Glucose 70 - 99 mg/dL 784  696    BUN 8 - 23 mg/dL 58  52    Creatinine 2.95 - 1.00 mg/dL 2.84  1.32    Sodium 440 - 145 mmol/L 139  136    Potassium 3.5 - 5.1 mmol/L 3.9  4.5    Chloride 98 - 111 mmol/L 106  105    CO2 22 - 32 mmol/L 16  20    Calcium 8.9 - 10.3 mg/dL 8.4  8.7    Total Protein 6.5 - 8.1 g/dL 5.8  7.6  7.8   Total Bilirubin 0.3 - 1.2 mg/dL 0.9  0.9  0.4   Alkaline Phos 38 - 126 U/L 112   199    AST 15 - 41 U/L 127  139  144   ALT 0 - 44 U/L 133  135  140     Renal function Estimated Creatinine Clearance: 13.2 mL/min (A) (by C-G formula based on SCr of 3.94 mg/dL (H)).  Hgb A1c MFr Bld (% of total Hgb)  Date Value  02/11/2023 4.7    LDL Cholesterol (Calc)  Date Value Ref Range Status  02/11/2023 19 mg/dL (calc) Final    Comment:    Reference range: <100 . Desirable range <100 mg/dL for primary prevention;   <70  mg/dL for patients with CHD or diabetic patients  with > or = 2 CHD risk factors. [name] LDL-C is now calculated using the Martin-Hopkins  calculation, which is a validated novel method providing  better accuracy than the Friedewald equation in the  estimation of LDL-C.  Horald Pollen et al. Lenox Ahr. 3329;518(84): 2061-2068  (http://education.[facility].com/faq/FAQ164)      Rande Brunt. Lenell Antu, MD FACS [facility]  Office Phone Number: [phone] 02/14/2023 3:43 PM   Total time spent on preparing this encounter including chart review, data review, collecting history, examining the patient, coordinating care for this new patient, 60 minutes.  Portions of this report may have been transcribed using voice recognition software.  Every effort has been made to ensure accuracy; however, inadvertent computerized transcription errors may still be present.

## 2023-02-14 NOTE — Consult Note (Signed)
NAME:  Alexandra Foster, MRN:  [id number], DOB:  11-29-1950, LOS: 1 ADMISSION DATE:  02/13/2023, CONSULTATION DATE:  02/14/2023 REFERRING MD:  Lolita Patella, MD, CHIEF COMPLAINT:  Sepsis   History of Present Illness:   72 year old female with history of ESRD on HD secondary to diabetic nephropathy, also s/p left BKA, presenting to the hospital with AMS.  Patient recently had a tunneled catheter placed which was noted to be malfunctioning. She was also noted to be more altered, somnolent, and forgetful. She was sent to the ED for further workup and evaluation. She was recently admitted to the hospital in August/September of 2024 where she was noted to have an infected dialysis catheter with positive blood cultures with MRSA resulting in septic shock. Patient weaned off vasopressors, and was dialyzed through a temporary femoral dialysis catheter. With improvement, patient was discharged on 01/03/2023. Patient also has a G-Tube placed for feeding. Cuffed tunneled HD catheter placed via the left internal jugular on 02/04/2023 with vascular surgery.  She was brought in via EMS for altered mental status on 02/13/2023. Patient's mental status notable for sudden change by family where she was no longer oriented to time and was confused. She was also nauseated during dialysis. Further history from family per chart review notable for increased secretions from around the G-tube. Given recent infection, high concern for sepsis, and patient was admitted for further workup after resuscitation and IV antibiotic administration.  Hemodynamically, patient's blood pressure has been soft but she was not hypotensive on presentation. Initial lactic acid values were within normal, though on repeat overnight it did jump to 5.5. blood gas overnight notable for metabolic acidosis, thou this improved on repeat this AM. CBC showing significant leukocytosis. CT chest with atelectasis at the bases, no pneumonia on my review.  Abdominal CT read with liver cirrhosis, with gas along the course of the gastrostomy tube.  PCCM consulted given patient's hypotension and concern for severe sepsis and septic shock.  Pertinent  Medical History   -T2DM -Left BKA -history of subdural hematoma -history of necrotizing fascitis -history of tracheostomy -ESRD on HD -afib -liver cirrhosis -adrenal insufficiency  Significant Hospital Events: Including procedures, antibiotic start and stop dates in addition to other pertinent events   02/13/2023: admission, mild hypotension overnight 02/14/2023: lactic acid elevated this AM, MAP > 65 mmHg   Objective   Blood pressure (!) 87/57, pulse 73, temperature 98.9 F (37.2 C), temperature source Axillary, resp. rate 17, height 5\' 4"  (1.626 m), weight 86 kg, SpO2 100%.        Intake/Output Summary (Last 24 hours) at 02/14/2023 0741 Last data filed at 02/13/2023 1717 Gross per 24 hour  Intake 1542.84 ml  Output --  Net 1542.84 ml   Filed Weights   02/13/23 0432  Weight: 86 kg    Examination: Physical Exam Constitutional:      General: She is not in acute distress.    Appearance: She is obese. She is ill-appearing.  Cardiovascular:     Rate and Rhythm: Normal rate and regular rhythm.     Pulses: Normal pulses.     Heart sounds: Normal heart sounds.  Pulmonary:     Effort: Pulmonary effort is normal.     Breath sounds: Normal breath sounds. No wheezing.  Musculoskeletal:     Right lower leg: Edema present.     Left lower leg: Edema present.  Neurological:     Mental Status: She is disoriented.     Assessment &  Plan:   Neurology #Toxic Metabolic Encephalopathy  This is in the setting of sepsis. Patient's mental status reportedly normal at baseline. CT head without acute abnormality, no concern for CVA at this point. Ammonia level normal at 23. Avoid psychotropic medications as able.  -advise d/c hydroxyzine  Cardiovascular #Septic  Shock #Afib  Progressive hypotension secondary to septic shock requiring initiation of vasopressor support. Now on nor-epinephrine, with goal MAP > 60 mmHg. TTE from August of 2024 showed an enlarged RV, suggestive of pulmonary hypertension. Will repeat given hypotension.  -pressors for goal MAP > 60 mmHg -trend lactic acid -TTE  Pulmonary #Pulmonary Hypertension #OSA  This is likely group two secondary to heart failure with preserved ejection fraction. Otherwise no acute pulmonary issues at this point, with chest CT showing atelectasis but not pneumonia. Does have OSA, will resume CPAP nocturnally.  -resume CPAP -goal SpO2 > 92%  Gastrointestinal #Liver Cirrhosis #elevated transaminase levels  Imaging shows a cirrhotic appearing liver, but unclear cause behind liver cirrhosis. MELD score is 21, with elevation in AST and ALT and decrease in synthetic function. She's also had a G-tube placed 05/2022 with increased secretions from around it. Unclear if this represents infection, and will consider consulting with general surgery tomorrow for further evaluation. Patient could benefit from a GI consult to guide investigation into the etiology behind her liver cirrhosis.  Renal #ESRD on HD  History of ESRD on HD, with recently infected dialysis catheter. This was recently re-placed and appears to have low flows and is functioning poorly. Given septic shock, high concern for an infected dialysis catheter. This was explanted today with vascular surgery. Appreciate input from nephrology. Will need another dialysis catheter placed to continue with HD.  Endocrine #Presumed Adrenal Insufficiency  Report of adrenal insufficiency and patient has been maintained on replacement outpatient. She has not had appropriate testing to differentiate primary vs secondary insufficiency. She's on hydrocortisone which is continued given septic shock.  -ICU glycemic protocol  Hem/Onc  Heparin subQ for  prophylaxis  ID #Septic Shock #Recent MRSA bacteremia  Presents for septic shock with possible sources include infected dialysis catheter or infected G-tube insertion site. She's had a history of necrotizing fascitis in the past as well as MRSA bacteremia. Blood cultures drawn and pending, and she is on broad spectrum antibiotics. Will consider consultation with surgery tomorrow to assess G-tube.  -continue broad spectrum antibiotics -dialysis catheter d/c'd -await blood cultures  Best Practice (right click and "Reselect all SmartList Selections" daily)   Diet/type: tubefeeds DVT prophylaxis: prophylactic heparin  GI prophylaxis: PPI Lines: N/A Foley:  N/A Code Status:  full code Last date of multidisciplinary goals of care discussion [02/14/2023]  Labs   CBC: Recent Labs  Lab 02/13/23 0047 02/13/23 0629 02/14/23 0500  WBC 33.2* 12.6* 32.9*  NEUTROABS 32.0* 6.7  --   HGB 14.2 13.1 13.4  HCT 47.7* 43.5 42.9  MCV 86.9 85.8 85.0  PLT 283 297 284    Basic Metabolic Panel: Recent Labs  Lab 02/13/23 0047 02/13/23 0438 02/14/23 0500  NA  --  136 139  K  --  4.5 3.9  CL  --  105 106  CO2  --  20* 16*  GLUCOSE  --  109* 174*  BUN  --  52* 58*  CREATININE  --  3.22* 3.94*  CALCIUM  --  8.7* 8.4*  MG 1.9  --   --    GFR: Estimated Creatinine Clearance: 13.3 mL/min (A) (by C-G formula based  on SCr of 3.94 mg/dL (H)). Recent Labs  Lab 02/13/23 0047 02/13/23 0438 02/13/23 0629 02/14/23 0500  WBC 33.2*  --  12.6* 32.9*  LATICACIDVEN 4.8* 1.7 1.3 5.5*    Liver Function Tests: Recent Labs  Lab 02/11/23 1455 02/13/23 0438 02/14/23 0500  AST 144* 139* 127*  ALT 140* 135* 133*  ALKPHOS  --  199* 112  BILITOT 0.4 0.9 0.9  PROT 7.8 7.6 5.8*  ALBUMIN  --  2.7* 2.0*   No results for input(s): "LIPASE", "AMYLASE" in the last 168 hours. Recent Labs  Lab 02/13/23 0047  AMMONIA 23    ABG    Component Value Date/Time   PHART 7.40 05/14/2021 0341   PCO2ART 45  05/14/2021 0341   PO2ART 129 (H) 05/14/2021 0341   HCO3 19.2 (L) 02/14/2023 0500   ACIDBASEDEF 6.7 (H) 02/14/2023 0500   O2SAT 85.7 02/14/2023 0500     Coagulation Profile: Recent Labs  Lab 02/13/23 0438  INR 1.2    Cardiac Enzymes: No results for input(s): "CKTOTAL", "CKMB", "CKMBINDEX", "TROPONINI" in the last 168 hours.  HbA1C: Hgb A1c MFr Bld  Date/Time Value Ref Range Status  02/11/2023 02:55 PM 4.7 <5.7 % of total Hgb Final    Comment:    For the purpose of screening for the presence of diabetes: . <5.7%       Consistent with the absence of diabetes 5.7-6.4%    Consistent with increased risk for diabetes             (prediabetes) > or =6.5%  Consistent with diabetes . This assay result is consistent with a decreased risk of diabetes. . Currently, no consensus exists regarding use of hemoglobin A1c for diagnosis of diabetes in children. . According to American Diabetes Association (ADA) guidelines, hemoglobin A1c <7.0% represents optimal control in non-pregnant diabetic patients. Different metrics may apply to specific patient populations.  Standards of Medical Care in Diabetes(ADA). [name]   12/07/2022 02:13 AM 5.2 4.8 - 5.6 % Final    Comment:    (NOTE) Pre diabetes:          5.7%-6.4%  Diabetes:              >6.4%  Glycemic control for   <7.0% adults with diabetes     CBG: Recent Labs  Lab 02/14/23 0453  GLUCAP 157*      Past Medical History:  She,  has a past medical history of AKI (acute kidney injury) (HCC), Arthritis, Asthma, Bacteremia, COPD (chronic obstructive pulmonary disease) (HCC), Diabetes mellitus without complication (HCC), Endometriosis, GERD (gastroesophageal reflux disease), History of echocardiogram, Hypertension, Obesity, PAF (paroxysmal atrial fibrillation) (HCC), and Sleep apnea.   Surgical History:   Past Surgical History:  Procedure Laterality Date   ABDOMINAL HYSTERECTOMY     APPLICATION OF WOUND VAC Left 05/17/2021    Procedure: APPLICATION OF WOUND VAC/WOUND VAC EXCHANGE-Matrix Myriad;  Surgeon: Carolan Shiver, MD;  Location: [facility];  Service: General;  Laterality: Left;   APPLICATION OF WOUND VAC  05/24/2021   Procedure: APPLICATION OF WOUND VAC;  Surgeon: Carolan Shiver, MD;  Location: [facility];  Service: General;;   APPLICATION OF WOUND VAC  05/10/2021   Procedure: APPLICATION OF WOUND VAC;  Surgeon: Carolan Shiver, MD;  Location: [facility];  Service: General;;   COLONOSCOPY  10/29/2006   Dr Servando Snare   COLONOSCOPY WITH PROPOFOL N/A 11/05/2016   Procedure: COLONOSCOPY WITH PROPOFOL;  Surgeon: Earline Mayotte, MD;  Location: [facility];  Service: Endoscopy;  Laterality: N/A;   DIALYSIS/PERMA CATHETER INSERTION N/A 12/19/2022   Procedure: DIALYSIS/PERMA CATHETER INSERTION;  Surgeon: Annice Needy, MD;  Location: [facility];  Service: Cardiovascular;  Laterality: N/A;   DIALYSIS/PERMA CATHETER INSERTION N/A 02/04/2023   Procedure: DIALYSIS/PERMA CATHETER INSERTION;  Surgeon: Annice Needy, MD;  Location: [facility];  Service: Cardiovascular;  Laterality: N/A;   INCISION AND DRAINAGE OF WOUND Left 05/24/2021   Procedure: IRRIGATION AND DEBRIDEMENT LEFT LEG;  Surgeon: Carolan Shiver, MD;  Location: [facility];  Service: General;  Laterality: Left;   INCISION AND DRAINAGE OF WOUND Left 05/10/2021   Procedure: IRRIGATION AND DEBRIDEMENT LEFT LEG;  Surgeon: Carolan Shiver, MD;  Location: [facility];  Service: General;  Laterality: Left;   IR FLUORO GUIDE CV LINE RIGHT  06/12/2021   IR PERC TUN PERIT CATH WO PORT S&I /IMAG  06/12/2021   IR REPLC GASTRO/COLONIC TUBE PERCUT W/FLUORO  06/12/2021   IVC FILTER INSERTION N/A 05/29/2021   Procedure: IVC FILTER INSERTION;  Surgeon: Renford Dills, MD;  Location: [facility];  Service: Cardiovascular;  Laterality: N/A;   MINOR GRAFT APPLICATION  05/24/2021   Procedure: Myriad Matrix  APPLICATION;  Surgeon: Carolan Shiver, MD;  Location: [facility];  Service: General;;   NASAL SINUS SURGERY  2002   Dr Chestine Spore   PEG PLACEMENT N/A 05/28/2021   Procedure: PERCUTANEOUS ENDOSCOPIC GASTROSTOMY (PEG) PLACEMENT;  Surgeon: Sung Amabile, DO;  Location: [facility];  Service: General;  Laterality: N/A;  TRAVEL CASE   TRACHEOSTOMY TUBE PLACEMENT N/A 05/17/2021   Procedure: TRACHEOSTOMY;  Surgeon: Geanie Logan, MD;  Location: [facility];  Service: ENT;  Laterality: N/A;   WOUND DEBRIDEMENT Left 05/07/2021   Procedure: DEBRIDEMENT WOUND;  Surgeon: Carolan Shiver, MD;  Location: [facility];  Service: General;  Laterality: Left;     Social History:   reports that she quit smoking about 21 years ago. Her smoking use included cigarettes. She started smoking about 61 years ago. She has a 80 pack-year smoking history. She quit smokeless tobacco use about 21 years ago.  Her smokeless tobacco use included snuff. She reports that she does not drink alcohol and does not use drugs.   Family History:  Her family history includes Alcohol abuse in her brother; Breast cancer (age of onset: 32) in her sister; Congestive Heart Failure in her mother; Coronary artery disease (age of onset: 20) in her father; Heart disease in her brother; Varicose Veins in her brother.   Allergies No Known Allergies   Home Medications  Prior to Admission medications   Medication Sig Start Date End Date Taking? Authorizing Provider  acetaminophen (TYLENOL) 325 MG tablet Place 2 tablets (650 mg total) into feeding tube every 6 (six) hours as needed for mild pain (or Fever >/= 101). 12/29/22  Yes Wieting, Richard, MD  amiodarone (PACERONE) 200 MG tablet Place 1 tablet (200 mg total) into feeding tube 2 (two) times daily. 02/12/23  Yes Sowles, [name], MD  ascorbic acid (VITAMIN C) 500 MG tablet Place 1 tablet (500 mg total) into feeding tube 2 (two) times daily. 06/13/21  Yes Erin Fulling, MD  atorvastatin (LIPITOR) 20 MG tablet Place 1 tablet (20 mg total)  into feeding tube daily. 02/12/23  Yes Sowles, [name], MD  cholestyramine Lanetta Inch) 4 g packet Place 1 packet (4 g total) into feeding tube 3 (three) times daily. 02/11/23  Yes Sowles, [name], MD  clonazePAM (KLONOPIN) 0.5 MG tablet Take 0.5-1 tablets (0.25-0.5 mg total)  by mouth daily as needed for anxiety. 02/11/23  Yes Sowles, [name], MD  DAPTOmycin 500 mg in sodium chloride 0.9 % 50 mL Inject 500 mg into the vein Every Tuesday,Thursday,and Saturday with dialysis. Stop after 01/03/23 dose (given with dialysis) 12/30/22  Yes Wieting, Richard, MD  diphenoxylate-atropine (LOMOTIL) 2.5-0.025 MG tablet 1 tablet by Per J Tube route 4 (four) times daily as needed for diarrhea or loose stools. 12/29/22  Yes Wieting, Richard, MD  escitalopram (LEXAPRO) 10 MG tablet Place 1 tablet (10 mg total) into feeding tube daily. 02/11/23  Yes Sowles, [name], MD  famotidine (PEPCID) 10 MG tablet Place 1 tablet (10 mg total) into feeding tube daily. 02/11/23  Yes Sowles, [name], MD  fludrocortisone (FLORINEF) 0.1 MG tablet Place 2 tablets (0.2 mg total) into feeding tube daily. 12/30/22  Yes Wieting, Richard, MD  hydrOXYzine (ATARAX) 50 MG tablet Take 1 tablet (50 mg total) by mouth 3 (three) times daily as needed. 02/11/23  Yes Sowles, [name], MD  metoCLOPramide (REGLAN) 5 MG tablet Take 1 tablet (5 mg total) by mouth 3 (three) times daily before meals. 02/11/23  Yes Sowles, [name], MD  midodrine (PROAMATINE) 10 MG tablet Place 2 tablets (20 mg total) into feeding tube 3 (three) times daily with meals. 02/12/23  Yes Sowles, [name], MD  multivitamin (RENA-VIT) TABS tablet Place 1 tablet into feeding tube at bedtime. 02/04/23  Yes Sowles, [name], MD  nystatin (MYCOSTATIN/NYSTOP) powder Apply topically 3 (three) times daily. 12/29/22  Yes Wieting, Richard, MD  polyvinyl alcohol (LIQUIFILM TEARS) 1.4 % ophthalmic solution Place 1 drop into both eyes as needed for dry eyes. 12/29/22  Yes Alford Highland, MD  traZODone  (DESYREL) 50 MG tablet Take 0.5 tablets (25 mg total) by mouth at bedtime as needed for sleep. 02/11/23  Yes Sowles, [name], MD  vitamin D3 (CHOLECALCIFEROL) 25 MCG tablet Place 2 tablets (2,000 Units total) into feeding tube daily. 01/02/23  Yes Enedina Finner, MD  Accu-Chek Softclix Lancets lancets 1 each by Other route 4 (four) times daily -  before meals and at bedtime. Use before meals and at bedtime for blood glucose monitoring 01/20/23 02/19/23  [name], MD  Blood Glucose Monitoring Suppl DEVI 1 each by Does not apply route in the morning, at noon, and at bedtime. May substitute to any manufacturer covered by patient's insurance. 01/20/23   [name], MD  Glucose Blood (BLOOD GLUCOSE TEST STRIPS) STRP 1 each by In Vitro route 4 (four) times daily -  before meals and at bedtime. May substitute to any manufacturer covered by patient's insurance. 01/20/23 02/19/23  [name], MD  Nutritional Supplements (FEEDING SUPPLEMENT, NEPRO CARB STEADY,) LIQD Place 1,120 mLs into feeding tube daily. 12/29/22   Alford Highland, MD  Nutritional Supplements (FEEDING SUPPLEMENT, NEPRO CARB STEADY,) LIQD Place 1,120 mLs into feeding tube daily. 01/02/23   Enedina Finner, MD  Water For Irrigation, Sterile (FREE WATER) SOLN Place 50 mLs into feeding tube every 4 (four) hours. 12/29/22   Alford Highland, MD     Critical care time: 39 minutes    Raechel Chute, MD  Pulmonary Critical Care 02/14/2023 6:56 PM

## 2023-02-14 NOTE — Progress Notes (Signed)
TDC removed at bedside under local anesthesia. TDC intact.  Tip placed into sterile container for culture.  Patient tolerated well.  Rande Brunt. Lenell Antu, MD FACS [facility]  Office Phone Number: [phone] 02/14/2023 3:47 PM

## 2023-02-14 NOTE — ED Notes (Signed)
Primary RN made aware of tele monitoring BP reading

## 2023-02-14 NOTE — ED Notes (Signed)
Per hospitalitis wait on vasopressor due to BP. Obtain another Lactic and report results.

## 2023-02-14 NOTE — ED Notes (Signed)
Midodrine not stocked in pyxis. Will give per tube once stocked.

## 2023-02-14 NOTE — ED Notes (Signed)
Going to start levo, there was some back and forth about levo vs midodrine for bp support. Advocated for levo.

## 2023-02-14 NOTE — ED Notes (Signed)
Pt repositioned in bed. Blankets changed. Son at bedside.

## 2023-02-14 NOTE — Progress Notes (Signed)
[facility]  PROGRESS NOTE   Subjective:   72 year old female with history of hypertension, coronary artery disease, congestive heart failure, COPD, cirrhosis, diabetes, status post left AKA, status post G-tube placement.  Patient is now being admitted for altered mental status.  Her last dialysis treatment was on Thursday.  She is also found to be septic.  Patient received Zosyn and vancomycin so far.  Objective:  Vital signs: Blood pressure (!) 87/54, pulse 73, temperature 99.8 F (37.7 C), temperature source Axillary, resp. rate 20, height 5\' 4"  (1.626 m), weight 86 kg, SpO2 99%.  Intake/Output Summary (Last 24 hours) at 02/14/2023 1221 Last data filed at 02/14/2023 0852 Gross per 24 hour  Intake 443.76 ml  Output --  Net 443.76 ml   Filed Weights   02/13/23 0432  Weight: 86 kg     Physical Exam: General:  No acute distress  Head:  Normocephalic, atraumatic. Moist oral mucosal membranes  Eyes:  Anicteric  Neck:  Supple  Lungs:   Clear to auscultation, normal effort  Heart:  S1S2 no rubs  Abdomen:   Soft, nontender, bowel sounds present  Extremities: Left AKA.  Neurologic:  Awake, alert, following commands  Skin:  No lesions  Access:     Basic Metabolic Panel: Recent Labs  Lab 02/13/23 0047 02/13/23 0438 02/14/23 0500  NA  --  136 139  K  --  4.5 3.9  CL  --  105 106  CO2  --  20* 16*  GLUCOSE  --  109* 174*  BUN  --  52* 58*  CREATININE  --  3.22* 3.94*  CALCIUM  --  8.7* 8.4*  MG 1.9  --   --    GFR: Estimated Creatinine Clearance: 13.3 mL/min (A) (by C-G formula based on SCr of 3.94 mg/dL (H)).  Liver Function Tests: Recent Labs  Lab 02/11/23 1455 02/13/23 0438 02/14/23 0500  AST 144* 139* 127*  ALT 140* 135* 133*  ALKPHOS  --  199* 112  BILITOT 0.4 0.9 0.9  PROT 7.8 7.6 5.8*  ALBUMIN  --  2.7* 2.0*   No results for input(s): "LIPASE", "AMYLASE" in the last 168 hours. Recent Labs  Lab 02/13/23 0047  AMMONIA 23     CBC: Recent Labs  Lab 02/13/23 0047 02/13/23 0629 02/14/23 0500  WBC 33.2* 12.6* 32.9*  NEUTROABS 32.0* 6.7  --   HGB 14.2 13.1 13.4  HCT 47.7* 43.5 42.9  MCV 86.9 85.8 85.0  PLT 283 297 284     HbA1C: Hgb A1c MFr Bld  Date/Time Value Ref Range Status  02/11/2023 02:55 PM 4.7 <5.7 % of total Hgb Final    Comment:    For the purpose of screening for the presence of diabetes: . <5.7%       Consistent with the absence of diabetes 5.7-6.4%    Consistent with increased risk for diabetes             (prediabetes) > or =6.5%  Consistent with diabetes . This assay result is consistent with a decreased risk of diabetes. . Currently, no consensus exists regarding use of hemoglobin A1c for diagnosis of diabetes in children. . According to American Diabetes Association (ADA) guidelines, hemoglobin A1c <7.0% represents optimal control in non-pregnant diabetic patients. Different metrics may apply to specific patient populations.  Standards of Medical Care in Diabetes(ADA). [name]   12/07/2022 02:13 AM 5.2 4.8 - 5.6 % Final    Comment:    (NOTE) Pre diabetes:  5.7%-6.4%  Diabetes:              >6.4%  Glycemic control for   <7.0% adults with diabetes     Urinalysis: Recent Labs    02/13/23 0629  COLORURINE AMBER*  LABSPEC 1.016  PHURINE 5.0  GLUCOSEU NEGATIVE  HGBUR NEGATIVE  BILIRUBINUR NEGATIVE  KETONESUR NEGATIVE  PROTEINUR 30*  NITRITE NEGATIVE  LEUKOCYTESUR LARGE*      Imaging: DG Chest 1 View  Result Date: 02/13/2023 CLINICAL DATA:  Congestive heart failure EXAM: CHEST  1 VIEW COMPARISON:  12/25/2022 FINDINGS: Left central venous catheter with tip over the right atrium. Shallow inspiration. Cardiac enlargement. No vascular congestion, edema, or consolidation. No pleural effusions. No pneumothorax. Mediastinal contours appear intact. Degenerative changes in the spine. IMPRESSION: Shallow inspiration. Cardiac enlargement. No evidence of active  pulmonary disease. Electronically Signed   By: Burman Nieves M.D.   On: 02/13/2023 18:15   CT CHEST ABDOMEN PELVIS WO CONTRAST  Result Date: 02/13/2023 CLINICAL DATA:  Sepsis.  Mental status changes. EXAM: CT CHEST, ABDOMEN AND PELVIS WITHOUT CONTRAST TECHNIQUE: Multidetector CT imaging of the chest, abdomen and pelvis was performed following the standard protocol without IV contrast. RADIATION DOSE REDUCTION: This exam was performed according to the departmental dose-optimization program which includes automated exposure control, adjustment of the mA and/or kV according to patient size and/or use of iterative reconstruction technique. COMPARISON:  12/10/2022 FINDINGS: CT CHEST FINDINGS Cardiovascular: The heart size is normal. No substantial pericardial effusion. Left-sided central line tip is positioned in the right atrium Mediastinum/Nodes: No mediastinal lymphadenopathy. No evidence for gross hilar lymphadenopathy although assessment is limited by the lack of intravenous contrast on the current study. The esophagus has normal imaging features. There is no axillary lymphadenopathy. Lungs/Pleura: Architectural distortion is noted in the lungs bilaterally, right greater than left. Probable superimposed atelectasis in the dependent bases. Clustered nodular airspace disease in the anterior right lung (image 60/4) is new in the interval. No dense focal airspace consolidation. No pleural effusion. Musculoskeletal: No worrisome lytic or sclerotic osseous abnormality. CT ABDOMEN PELVIS FINDINGS Hepatobiliary: Nodular liver contour compatible cirrhosis. Calcified gallstones evident. No intrahepatic or extrahepatic biliary dilation. Pancreas: No focal mass lesion. No dilatation of the main duct. No intraparenchymal cyst. No peripancreatic edema. Spleen: No splenomegaly. No suspicious focal mass lesion. Adrenals/Urinary Tract: No adrenal nodule or mass. Nonobstructing stones noted right kidney. Left kidney  unremarkable. No evidence for hydroureter. The urinary bladder appears normal for the degree of distention. Stomach/Bowel: Gastrostomy tube evident. There is gas along the course of the gastrostomy tube which may be anterior wall of the stomach herniating into the defect in the rectus sheath for passage of the tube (see coronal 67 and 68 of series 6 and axial 60/2) although a small extraluminal focus of gas related to the G-tube tract could also have this appearance. No focal rim enhancing fluid collection to suggest abscess. Duodenum is normally positioned as is the ligament of Treitz. No small bowel wall thickening. No small bowel dilatation. The terminal ileum is normal. The appendix is normal. No gross colonic mass. No colonic wall thickening. Diverticular changes are noted in the left colon without evidence of diverticulitis. Vascular/Lymphatic: There is mild atherosclerotic calcification of the abdominal aorta without aneurysm. IVC filter visualized. There is no gastrohepatic or hepatoduodenal ligament lymphadenopathy. No retroperitoneal or mesenteric lymphadenopathy. No pelvic sidewall lymphadenopathy. Reproductive: There is no adnexal mass. Other: No intraperitoneal free fluid. Musculoskeletal: No worrisome lytic or sclerotic osseous abnormality. IMPRESSION: 1.  Clustered nodular airspace disease in the anterior right lung is new in the interval, compatible with an infectious/inflammatory process. 2. Gastrostomy tube evident. There is gas along the course of the gastrostomy tube which may be anterior wall of the stomach herniating into the rectus sheath defect for the G tube although a small extraluminal focus of gas in the G-tube tract could also have this appearance. No focal rim enhancing fluid collection to suggest abscess. 3. Cirrhosis. 4. Cholelithiasis. 5. Nonobstructing right renal stones. 6. Left colonic diverticulosis without diverticulitis. 7.  Aortic Atherosclerosis (ICD10-I70.0). Electronically  Signed   By: Kennith Center M.D.   On: 02/13/2023 06:59   CT Head Wo Contrast  Result Date: 02/13/2023 CLINICAL DATA:  Mental status change with unknown cause EXAM: CT HEAD WITHOUT CONTRAST TECHNIQUE: Contiguous axial images were obtained from the base of the skull through the vertex without intravenous contrast. RADIATION DOSE REDUCTION: This exam was performed according to the departmental dose-optimization program which includes automated exposure control, adjustment of the mA and/or kV according to patient size and/or use of iterative reconstruction technique. COMPARISON:  Brain MRI 12/07/2022 FINDINGS: Brain: No evidence of acute infarction, hemorrhage, hydrocephalus, extra-axial collection or mass lesion/mass effect. Expanded and low-density sella, usually incidental in isolation. Generalized atrophy. Vascular: No hyperdense vessel or unexpected calcification. Skull: Normal. Negative for fracture or focal lesion. Sinuses/Orbits: No acute finding. IMPRESSION: No acute or interval finding. Electronically Signed   By: Tiburcio Pea M.D.   On: 02/13/2023 06:25     Medications:    sodium chloride Stopped (02/14/23 1013)   dextrose 5% lactated ringers 40 mL/hr at 02/14/23 0330   fluconazole (DIFLUCAN) IV Stopped (02/14/23 9147)   norepinephrine (LEVOPHED) Adult infusion 7 mcg/min (02/14/23 1012)   piperacillin-tazobactam (ZOSYN)  IV Stopped (02/14/23 0851)    atorvastatin  20 mg Per Tube Daily   budesonide (PULMICORT) nebulizer solution  0.25 mg Nebulization BID   Chlorhexidine Gluconate Cloth  6 each Topical Q0600   cholestyramine  4 g Per Tube TID   escitalopram  10 mg Per Tube Daily   heparin  5,000 Units Subcutaneous Q12H   hydrocortisone sod succinate (SOLU-CORTEF) inj  100 mg Intravenous Q8H   hydrOXYzine  50 mg Oral Q12H   ipratropium-albuterol  3 mL Nebulization Q6H   midodrine  20 mg Oral TID WC   mupirocin ointment  1 Application Nasal BID   pantoprazole (PROTONIX) IV  40 mg  Intravenous Q24H    Assessment/ Plan:     72 year old female with history of hypertension, coronary artery disease, congestive heart failure, COPD, cirrhosis, diabetes, status post left AKA, status post G-tube placement.  Patient is now being admitted for altered mental status.  Her last dialysis treatment was on Thursday.  She is also found to be septic.  Patient received Zosyn and vancomycin so far.  #1: End-stage renal disease: Last treatment was on Thursday.  Patient has permacath which most likely is infected.  Spoke to vascular to have the catheter removed.  Will rest her for today and tomorrow.  Will reassess her on Monday for a new catheter placement.  #2: Hypotension: Patient is now on Levophed.  Continue the same for blood pressure support.  Patient to be admitted to ICU.  #3: Sepsis: Blood cultures done.  Patient is now on Zosyn, vancomycin and fluconazole.  #4: Secondary hyperparathyroidism: Will continue to monitor closely.  Patient to be admitted to the ICU for monitoring. Permacath to be removed today by vascular. Labs and  medications reviewed. Will continue to follow along with you.   LOS: 1 Lorain Childes, MD [facility] 11/2/202412:21 PM

## 2023-02-14 NOTE — ED Notes (Signed)
ICU MD came to bedside. BP is better than it was. Hold vasopressors for now. Pharmacy was called, Zosyn on the way and is compatible with the fluconazole. Both IV's do not work to get new lactic. Will need to do u/s IV.

## 2023-02-14 NOTE — Consult Note (Signed)
Pharmacy Antibiotic Note  Alexandra Foster is a 72 y.o. female admitted on 02/13/2023 with  altered mental status .  Pharmacy has been consulted for Vancomycin and Zosyn dosing.  Plan: Vancomycin 2g IV total x 1 as loading dose Patient receives HD outpatient. Will await nephrology plan and then order Vancomycin 1g IV to be given at the end of each dialysis session.  Zosyn 2.25g IV Q8 hours(dialysis dosing)  11/02  Pharmacy consulted for Fluconazole dosing. Ordered Fluconazole 400 mg IV q24h. Will adjust admininstration time to give after dialysis on dialysis days  Height: 5\' 4"  (162.6 cm) Weight: 86 kg (189 lb 9.5 oz) IBW/kg (Calculated) : 54.7  Temp (24hrs), Avg:98.9 F (37.2 C), Min:97.1 F (36.2 C), Max:102.6 F (39.2 C)  Recent Labs  Lab 02/13/23 0047 02/13/23 0438 02/13/23 0629 02/14/23 0500  WBC 33.2*  --  12.6* 32.9*  CREATININE  --  3.22*  --  3.94*  LATICACIDVEN 4.8* 1.7 1.3 5.5*    Estimated Creatinine Clearance: 13.3 mL/min (A) (by C-G formula based on SCr of 3.94 mg/dL (H)).    No Known Allergies  Antimicrobials this admission: Vancomycin 11/1 >>  Zosyn 11/1 >>  Cefepime/Azithro x 1 Fluconazole >>  Dose adjustments this admission: HD dosing protocol of antibiotics being used  Microbiology results: 11/1 BCx: pending 11/1 UCx: pending  11/1 MRSA PCR: pending  Thank you for allowing pharmacy to be a part of this patient's care.  Raghad Lorenz,Natha S 02/14/2023 6:03 AM

## 2023-02-14 NOTE — Consult Note (Signed)
Consultation Note Date: 02/14/2023   Patient Name: Alexandra Foster  DOB: 14-Jan-1951  MRN: [id number]  Age / Sex: 72 y.o., female  PCP: [name], MD Referring Physician: Tresa Moore, MD  Reason for Consultation: Establishing goals of care   HPI/Brief Hospital Course: 72 y.o. female  with past medical history of ESRD on HD TTS, asthma/COPD, severe dysphagia s/p G-tube placement, type 2 diabetes with related diabetic neuropathy status post left AKA, OSA on CPAP, cirrhosis, adrenal insufficiency, paroxysmal A-fib admitted from home on 02/13/2023 with altered mental status.  According to daughter patient became altered day prior to admission and also has concerns about yellow/greenish pus surrounding G-tube that has been present for well over a week.  Daughter also reports concern of newly placed left chest PermCath causing difficulties during last 2 HD sessions.  According to daughter Mr. Shelle Iron was scheduled to have PermCath exchanged next week.  ED found to have significant leukocytosis and metabolic acidosis, admitted to ICU for severe sepsis and septic shock requiring peripheral vasopressor support Concern for G tube versus PermCath source of infection  Noted recent admission in September for PermCath infection and MRSA bacteremia and septic shock  Palliative medicine was consulted for assisting with goals of care conversations  Subjective:  Extensive chart review has been completed prior to meeting patient including labs, vital signs, imaging, progress notes, orders, and available advanced directive documents from current and previous encounters.  Visited with Alexandra Foster at her bedside. She is awake, alert, difficulty to communicate with due to underlying dysarthria. Daughter-Alexandra Foster at bedside during time of visit.  Introduced myself as a Publishing rights manager as a member of the palliative care team. Explained palliative medicine is specialized  medical care for people living with serious illness. It focuses on providing relief from the symptoms and stress of a serious illness. The goal is to improve quality of life for both the patient and the family.   Alexandra Foster shares she is aware of role of PMT and can recall conversations during previous admissions.  Alexandra Foster shares she is her mother's primary caregiver in the home, she lives independently but spends 4 nights per week with her mother and father and her aunt-Brenda spends the other 3 nights. Alexandra Foster is Alexandra Foster's spouse but is not as involved in her care, he defers all questions and decisions to his daughter-Alexandra Foster.  Alexandra Foster shares at baseline she assists her mother with all ADL's including bathing, dressing and hygiene. Alexandra Foster primarily spends her time in bed or up in chair.  Attempted to elicit goals of care. We discussed code status, Full Code versus Do Not Resuscitate. Encouraged patient/family to consider DNR/DNI status understanding evidenced based poor outcomes in similar hospitalized patients, as the cause of the arrest is likely associated with chronic/terminal disease rather than a reversible acute cardio-pulmonary event. Alexandra Foster and Alexandra Foster confirm she wishes to remain Full Code at this time. They share they would likely not be accepting of long term life preserving measures.  I discussed importance of continued conversations with family/support persons and all members of their medical team regarding overall plan of care and treatment options ensuring decisions are in alignment with patients goals of care.  All questions/concerns addressed. Emotional support provided to patient/family/support persons. PMT will continue to follow and support patient as needed.  Objective: Primary Diagnoses: Present on Admission:  Sepsis (HCC)  Septic shock (HCC)   Physical Exam Constitutional:      General: She is not in acute distress.  Appearance: She is ill-appearing.   Pulmonary:     Effort: Pulmonary effort is normal. No respiratory distress.  Skin:    General: Skin is warm and dry.     Comments: Sloughing  Neurological:     Mental Status: She is alert and oriented to person, place, and time.     Cranial Nerves: Dysarthria present.     Motor: Weakness present.     Vital Signs: BP (!) 134/105   Pulse 70   Temp 98.7 F (37.1 C) (Axillary)   Resp (!) 21   Ht 5\' 4"  (1.626 m)   Wt 85.1 kg   SpO2 95%   BMI 32.20 kg/m  Pain Scale: 0-10   Pain Score: 0-No pain  IO: Intake/output summary:  Intake/Output Summary (Last 24 hours) at 02/14/2023 1712 Last data filed at 02/14/2023 1500 Gross per 24 hour  Intake 796.59 ml  Output --  Net 796.59 ml    LBM:   Baseline Weight: Weight: 86 kg Most recent weight: Weight: 85.1 kg       Palliative Assessment/Data:40%   Assessment and Plan  SUMMARY OF RECOMMENDATIONS   Full Code-Full Scope Time for outcomes PMT to continue to follow for ongoing needs and support  Palliative Prophylaxis:   Bowel Regimen, Delirium Protocol and Frequent Pain Assessment  Discussed With: Nursing staff and ICU attending   Thank you for this consult and allowing Palliative Medicine to participate in the care of Alexandra Foster. Palliative medicine will continue to follow and assist as needed.   Time Total: 75 minutes  Time spent includes: Detailed review of medical records (labs, imaging, vital signs), medically appropriate exam (mental status, respiratory, cardiac, skin), discussed with treatment team, counseling and educating patient, family and staff, documenting clinical information, medication management and coordination of care.   Signed by: Leeanne Deed, DNP, AGNP-C Palliative Medicine    Please contact Palliative Medicine Team phone at [phone] for questions and concerns.  For individual provider: See Loretha Stapler

## 2023-02-14 NOTE — Progress Notes (Signed)
Dr. Aundria Rud gave order to titrate levophed drip to keep MAP > 60.

## 2023-02-14 NOTE — Progress Notes (Addendum)
Spoke with Dr. Georgeann Oppenheim and made MD aware that patient has left sided mouth droop and difficulty with speech being clear at times. Husband at bedside and states her mouth drooping and unclear speech has been going on for 2 years and is not new. Husband states that patient's speech is much more clear at this time than it has been. Patient alert to self and place, follows commands. MD aware of all mentioned above including that patient c/o itching. MD gave order for Vaseline PRN itching per family's request and atarax has already been ordered. No further orders given by MD at this time.

## 2023-02-14 NOTE — ED Notes (Signed)
Patient has complained that her skin is continuously itching.  She has rolled and had her back scratched, and then moisturizing cream has been applied.  Patient says that it helps.  Moisturizer also applied to lips.

## 2023-02-14 NOTE — ED Notes (Signed)
Nephrologist at bedside. Unknown when was last dialysis. Unknown when port a cath will be removed/replaced which is suspected to be source of infection.

## 2023-02-14 NOTE — ED Notes (Signed)
Pt has cellulitis with skin tear to L arm where this RN would have done u/s IV. IV team order placed.

## 2023-02-14 NOTE — Progress Notes (Signed)
       CROSS COVER NOTE  NAME: Alexandra Foster MRN: [id number] DOB : 11/25/1950    Concern as stated by nurse / staff   Message received from RN  Steward Drone, this patient's blood pressure has continued to trend downward, and now has a BP of 75/55 with MAP of 62.     Pertinent findings on chart review: Records/ labs/ admission note reviewed in EPIC  Assessment and  Interventions   Assessment:    02/14/2023    3:30 AM 02/14/2023    2:30 AM 02/14/2023    1:30 AM  Vitals with BMI  Systolic 80 74 88  Diastolic 48 55 58  Pulse 74 78 68    Patient alert able to answer simple questions. C/o intense itching on her back which is chronic No shortness of breath  SR, sats good on  3L supplemental oxygen  Plan: Severe sepsis Total 1.5 liters LR orderedfollow cultures and fever curve. - BP improved, has chronic hypotension and she appears to be at her baseline. Low volume dextrose fluids initiated with her being npo Metabolic acidosis with bicarb deficit - 2 amps sodium bicarb + above mentioned fluids Need perm cath replacement asap Diflucan added to vanc and zosyn       Donnie Mesa NP [facility] Cross Cover 7pm-7am - check amion for availability Pager [phone]

## 2023-02-14 NOTE — ED Notes (Signed)
  CBG 164  

## 2023-02-14 NOTE — ED Notes (Addendum)
Husband at bedside, last dialysis was Thursday/2d ago.

## 2023-02-14 NOTE — ED Notes (Signed)
Received report. Overdue meds are coming from pharmacy. Messaged hospitalist re: BP readings and whether they want to start pressors. Pt has known source of infx (permacath,to chest).

## 2023-02-14 NOTE — Progress Notes (Signed)
PROGRESS NOTE    Alexandra Foster  UJW:[id number] DOB: Mar 17, 1951 DOA: 02/13/2023 PCP: [name], MD    Brief Narrative:   72 y.o. female with medical history significant of ESRD on HD TTS, via a left chest PermCath, asthma/COPD, severe dysphagia status post G-tube, IIDM, HTN, left AKA, PAF, OSA on CPAP, cirrhosis, adrenal insufficiency on Cortef, brought in by family member for evaluation of sepsis.   Patient is confused, or history provided by daughter over the phone and patient's sister at bedside.  Patient started to have altered mentations yesterday and threw up x 1 during dialysis.  Overnight, patient became more confused.  According to daughter, patient's G-tube insertion site has a possible infected as she has been seeing more yellow-greenish pus coming out from around the G-tube entrance and she had to change the gauze of the G-tube 2 times a day to clean the past.  However patient does have a visiting wound care nurse seeing her 2 weeks sick day and according to the wound care team, the G-tube site was not infected.  In addition, daughter also reported that for the last 2 HD sessions, the outpatient HD team reported that the PermCath on left chest " has some narrowing and blood flow very slow" and patient was scheduled to have PermCath exchanged next week.  Patient denied any fever chills no diarrhea no abdominal pain or chest pain.   Assessment & Plan:   Principal Problem:   Sepsis (HCC) Active Problems:   Aspiration pneumonia (HCC)   Septic shock (HCC)  Septic shock Patient has had low blood pressure despite multiple fluid boluses.  Sepsis criteria met with leukocytosis, tachycardia, fever.  Potential sources include chest dialysis catheter versus gastric tube.  Higher suspicion that HD catheter site may be infected. Patient's maps remained low despite treatment with oral midodrine and IV stress dose steroids Plan: Upgrade to ICU Peripheral Levophed initiated PCCM  engaged Broad-spectrum antimicrobial coverage and antifungal coverage Vascular engaged to remove PermCath Guarded prognosis  Acute metabolic encephalopathy Likely secondary to sepsis Mentating reasonably clearly on my evaluation   Acute transaminitis Downtrending, likely related to sepsis Follow LFTs   ESRD on HD with access difficulties Case discussed with on-call nephrology.  Patient was last dialyzed Thursday 10/31.  Given concern that dialysis catheter may be infectious source it would be removed.  Case discussed with nephrology and vascular surgery.  Patient will get a line holiday for today and tomorrow.  Will be reevaluated for temporary dialysis access placement within the next 24 to 48 hours.  PAF Patient on anticoagulation for unclear reasons.  Amiodarone is currently held.  Heart rate controlled.  Continue to monitor on telemetry.  Restart amiodarone as appropriate   G-tube Severe dysphagia Currently NPO.  Per RN unable to tolerate oral intake.  G-tube feeding on hold until sepsis stabilized.  PPI for GI prophylaxis.  Restart G-tube feeding as appropriate.  Chronic adrenal insufficiency Continue midodrine 20 mg 3 times daily.  Hold home Solu-Cortef.  Initiate hydrocortisone stress dose 100 mg every 8 hours IV  OSA CPAP nightly   DVT prophylaxis: SQ heparin Code Status: Full Family Communication:Daughter [name] [phone] on 11/2 Disposition Plan:  Level of care: ICU  Consultants:  PCCM Nephrology Vascular surgery Palliative care  Procedures:  None  Antimicrobials: Vancomycin Zosyn Fluconazole   Subjective: Examined.  Remains in ED at time of my evaluation.  Ill-appearing.  Mentating reasonably clearly.  Reports clinical improvement since admission.  Objective: Vitals:   02/14/23  1215 02/14/23 1230 02/14/23 1245 02/14/23 1305  BP: (!) 79/51 (!) 82/53 108/62   Pulse: 73 72 70   Resp: 20 20 (!) 23 (!) 25  Temp:      TempSrc:      SpO2: 97% 96%  98% 98%  Weight:      Height:        Intake/Output Summary (Last 24 hours) at 02/14/2023 1313 Last data filed at 02/14/2023 0852 Gross per 24 hour  Intake 443.76 ml  Output --  Net 443.76 ml   Filed Weights   02/13/23 0432  Weight: 86 kg    Examination:  General exam: Ill-appearing Respiratory system: Bibasilar crackles.  Normal work of breathing.  2 L Cardiovascular system: S1-S2, regular rate, irregular rhythm, no murmurs, no pedal edema Gastrointestinal system: Soft, NT/ND, normal bowel sounds, + G-tube Central nervous system: Alert.  Oriented x 2.  No focal deficits Extremities: Decreased power bilateral lower extremities.  Gait not assessed Skin: No rashes, lesions or ulcers Psychiatry: Judgement and insight appear impaired. Mood & affect confused.     Data Reviewed: I have personally reviewed following labs and imaging studies  CBC: Recent Labs  Lab 02/13/23 0047 02/13/23 0629 02/14/23 0500  WBC 33.2* 12.6* 32.9*  NEUTROABS 32.0* 6.7  --   HGB 14.2 13.1 13.4  HCT 47.7* 43.5 42.9  MCV 86.9 85.8 85.0  PLT 283 297 284   Basic Metabolic Panel: Recent Labs  Lab 02/13/23 0047 02/13/23 0438 02/14/23 0500  NA  --  136 139  K  --  4.5 3.9  CL  --  105 106  CO2  --  20* 16*  GLUCOSE  --  109* 174*  BUN  --  52* 58*  CREATININE  --  3.22* 3.94*  CALCIUM  --  8.7* 8.4*  MG 1.9  --   --    GFR: Estimated Creatinine Clearance: 13.3 mL/min (A) (by C-G formula based on SCr of 3.94 mg/dL (H)). Liver Function Tests: Recent Labs  Lab 02/11/23 1455 02/13/23 0438 02/14/23 0500  AST 144* 139* 127*  ALT 140* 135* 133*  ALKPHOS  --  199* 112  BILITOT 0.4 0.9 0.9  PROT 7.8 7.6 5.8*  ALBUMIN  --  2.7* 2.0*   No results for input(s): "LIPASE", "AMYLASE" in the last 168 hours. Recent Labs  Lab 02/13/23 0047  AMMONIA 23   Coagulation Profile: Recent Labs  Lab 02/13/23 0438  INR 1.2   Cardiac Enzymes: No results for input(s): "CKTOTAL", "CKMB",  "CKMBINDEX", "TROPONINI" in the last 168 hours. BNP (last 3 results) No results for input(s): "PROBNP" in the last 8760 hours. HbA1C: Recent Labs    02/11/23 1455  HGBA1C 4.7   CBG: Recent Labs  Lab 02/14/23 0453 02/14/23 0857 02/14/23 1310  GLUCAP 157* 164* 190*   Lipid Profile: Recent Labs    02/11/23 1455  CHOL 64  HDL 27*  LDLCALC 19  TRIG 98  CHOLHDL 2.4   Thyroid Function Tests: Recent Labs    02/11/23 1455  TSH 3.06   Anemia Panel: No results for input(s): "VITAMINB12", "FOLATE", "FERRITIN", "TIBC", "IRON", "RETICCTPCT" in the last 72 hours. Sepsis Labs: Recent Labs  Lab 02/13/23 0438 02/13/23 0629 02/14/23 0500 02/14/23 0845  LATICACIDVEN 1.7 1.3 5.5* 3.8*    Recent Results (from the past 240 hour(s))  Culture, blood (Routine x 2)     Status: None (Preliminary result)   Collection Time: 02/13/23  4:39 AM   Specimen: BLOOD  Result Value Ref Range Status   Specimen Description BLOOD BLOOD LEFT ARM  Final   Special Requests   Final    BOTTLES DRAWN AEROBIC AND ANAEROBIC Blood Culture adequate volume   Culture   Final    NO GROWTH 1 DAY Performed at [facility], [address], [place], [place] [address]    Report Status PENDING  Incomplete  Culture, blood (Routine x 2)     Status: None (Preliminary result)   Collection Time: 02/13/23  4:39 AM   Specimen: BLOOD  Result Value Ref Range Status   Specimen Description BLOOD BLOOD RIGHT ARM  Final   Special Requests   Final    BOTTLES DRAWN AEROBIC AND ANAEROBIC Blood Culture adequate volume   Culture   Final    NO GROWTH 1 DAY Performed at [facility], [address] [place], [place] [address]    Report Status PENDING  Incomplete  Urine Culture     Status: Abnormal   Collection Time: 02/13/23  6:29 AM   Specimen: Urine, Random  Result Value Ref Range Status   Specimen Description   Final    URINE, RANDOM Performed at [facility], [address],  [place], [place] [address]    Special Requests   Final    NONE Reflexed from [id number] Performed at [facility], [address] [place], [place] [address]    Culture MULTIPLE SPECIES PRESENT, SUGGEST RECOLLECTION (A)  Final   Report Status 02/14/2023 FINAL  Final  Resp panel by RT-PCR (RSV, Flu A&B, Covid) Anterior Nasal Swab     Status: None   Collection Time: 02/13/23  1:30 PM   Specimen: Anterior Nasal Swab  Result Value Ref Range Status   SARS Coronavirus 2 by RT PCR NEGATIVE NEGATIVE Final    Comment: (NOTE) SARS-CoV-2 target nucleic acids are NOT DETECTED.  The SARS-CoV-2 RNA is generally detectable in upper respiratory specimens during the acute phase of infection. The lowest concentration of SARS-CoV-2 viral copies this assay can detect is 138 copies/mL. A negative result does not preclude SARS-Cov-2 infection and should not be used as the sole basis for treatment or other patient management decisions. A negative result may occur with  improper specimen collection/handling, submission of specimen other than nasopharyngeal swab, presence of viral mutation(s) within the areas targeted by this assay, and inadequate number of viral copies(<138 copies/mL). A negative result must be combined with clinical observations, patient history, and epidemiological information. The expected result is Negative.  Fact Sheet for Patients:  BloggerCourse.com  Fact Sheet for Healthcare Providers:  SeriousBroker.it  This test is no t yet approved or cleared by the [place] FDA and  has been authorized for detection and/or diagnosis of SARS-CoV-2 by FDA under an Emergency Use Authorization (EUA). This EUA will remain  in effect (meaning this test can be used) for the duration of the COVID-19 declaration under Section 564(b)(1) of the Act, 21 U.S.C.section 360bbb-3(b)(1), unless the authorization is terminated  or revoked sooner.        Influenza A by PCR NEGATIVE NEGATIVE Final   Influenza B by PCR NEGATIVE NEGATIVE Final    Comment: (NOTE) The Xpert Xpress SARS-CoV-2/FLU/RSV plus assay is intended as an aid in the diagnosis of influenza from Nasopharyngeal swab specimens and should not be used as a sole basis for treatment. Nasal washings and aspirates are unacceptable for Xpert Xpress SARS-CoV-2/FLU/RSV testing.  Fact Sheet for Patients: BloggerCourse.com  Fact Sheet for Healthcare Providers:  SeriousBroker.it  This test is not yet approved or cleared by the [place] and has been authorized for detection and/or diagnosis of SARS-CoV-2 by FDA under an Emergency Use Authorization (EUA). This EUA will remain in effect (meaning this test can be used) for the duration of the COVID-19 declaration under Section 564(b)(1) of the Act, 21 U.S.C. section 360bbb-3(b)(1), unless the authorization is terminated or revoked.     Resp Syncytial Virus by PCR NEGATIVE NEGATIVE Final    Comment: (NOTE) Fact Sheet for Patients: BloggerCourse.com  Fact Sheet for Healthcare Providers: SeriousBroker.it  This test is not yet approved or cleared by the [place] FDA and has been authorized for detection and/or diagnosis of SARS-CoV-2 by FDA under an Emergency Use Authorization (EUA). This EUA will remain in effect (meaning this test can be used) for the duration of the COVID-19 declaration under Section 564(b)(1) of the Act, 21 U.S.C. section 360bbb-3(b)(1), unless the authorization is terminated or revoked.  Performed at [facility], [address] [place], [place] [address]   MRSA Next Gen by PCR, Nasal     Status: Abnormal   Collection Time: 02/13/23  9:05 PM   Specimen: Nasal Mucosa; Nasal Swab  Result Value Ref Range Status   MRSA by PCR Next Gen DETECTED (A) NOT DETECTED Final    Comment:  CRITICAL RESULT CALLED TO, READ BACK BY AND VERIFIED WITH: Mosetta Putt RN @ 2300 02/13/23 [facility] (NOTE) The GeneXpert MRSA Assay (FDA approved for NASAL specimens only), is one component of a comprehensive MRSA colonization surveillance program. It is not intended to diagnose MRSA infection nor to guide or monitor treatment for MRSA infections. Test performance is not FDA approved in patients less than 31 years old. Performed at [facility], [address], [place], [place] [address]          Radiology Studies: DG Chest 1 View  Result Date: 02/13/2023 CLINICAL DATA:  Congestive heart failure EXAM: CHEST  1 VIEW COMPARISON:  12/25/2022 FINDINGS: Left central venous catheter with tip over the right atrium. Shallow inspiration. Cardiac enlargement. No vascular congestion, edema, or consolidation. No pleural effusions. No pneumothorax. Mediastinal contours appear intact. Degenerative changes in the spine. IMPRESSION: Shallow inspiration. Cardiac enlargement. No evidence of active pulmonary disease. Electronically Signed   By: Burman Nieves M.D.   On: 02/13/2023 18:15   CT CHEST ABDOMEN PELVIS WO CONTRAST  Result Date: 02/13/2023 CLINICAL DATA:  Sepsis.  Mental status changes. EXAM: CT CHEST, ABDOMEN AND PELVIS WITHOUT CONTRAST TECHNIQUE: Multidetector CT imaging of the chest, abdomen and pelvis was performed following the standard protocol without IV contrast. RADIATION DOSE REDUCTION: This exam was performed according to the departmental dose-optimization program which includes automated exposure control, adjustment of the mA and/or kV according to patient size and/or use of iterative reconstruction technique. COMPARISON:  12/10/2022 FINDINGS: CT CHEST FINDINGS Cardiovascular: The heart size is normal. No substantial pericardial effusion. Left-sided central line tip is positioned in the right atrium Mediastinum/Nodes: No mediastinal lymphadenopathy. No evidence for gross hilar  lymphadenopathy although assessment is limited by the lack of intravenous contrast on the current study. The esophagus has normal imaging features. There is no axillary lymphadenopathy. Lungs/Pleura: Architectural distortion is noted in the lungs bilaterally, right greater than left. Probable superimposed atelectasis in the dependent bases. Clustered nodular airspace disease in the anterior right lung (image 60/4) is new in the interval. No dense focal airspace consolidation. No pleural effusion. Musculoskeletal: No worrisome lytic or sclerotic osseous abnormality.  CT ABDOMEN PELVIS FINDINGS Hepatobiliary: Nodular liver contour compatible cirrhosis. Calcified gallstones evident. No intrahepatic or extrahepatic biliary dilation. Pancreas: No focal mass lesion. No dilatation of the main duct. No intraparenchymal cyst. No peripancreatic edema. Spleen: No splenomegaly. No suspicious focal mass lesion. Adrenals/Urinary Tract: No adrenal nodule or mass. Nonobstructing stones noted right kidney. Left kidney unremarkable. No evidence for hydroureter. The urinary bladder appears normal for the degree of distention. Stomach/Bowel: Gastrostomy tube evident. There is gas along the course of the gastrostomy tube which may be anterior wall of the stomach herniating into the defect in the rectus sheath for passage of the tube (see coronal 67 and 68 of series 6 and axial 60/2) although a small extraluminal focus of gas related to the G-tube tract could also have this appearance. No focal rim enhancing fluid collection to suggest abscess. Duodenum is normally positioned as is the ligament of Treitz. No small bowel wall thickening. No small bowel dilatation. The terminal ileum is normal. The appendix is normal. No gross colonic mass. No colonic wall thickening. Diverticular changes are noted in the left colon without evidence of diverticulitis. Vascular/Lymphatic: There is mild atherosclerotic calcification of the abdominal aorta  without aneurysm. IVC filter visualized. There is no gastrohepatic or hepatoduodenal ligament lymphadenopathy. No retroperitoneal or mesenteric lymphadenopathy. No pelvic sidewall lymphadenopathy. Reproductive: There is no adnexal mass. Other: No intraperitoneal free fluid. Musculoskeletal: No worrisome lytic or sclerotic osseous abnormality. IMPRESSION: 1. Clustered nodular airspace disease in the anterior right lung is new in the interval, compatible with an infectious/inflammatory process. 2. Gastrostomy tube evident. There is gas along the course of the gastrostomy tube which may be anterior wall of the stomach herniating into the rectus sheath defect for the G tube although a small extraluminal focus of gas in the G-tube tract could also have this appearance. No focal rim enhancing fluid collection to suggest abscess. 3. Cirrhosis. 4. Cholelithiasis. 5. Nonobstructing right renal stones. 6. Left colonic diverticulosis without diverticulitis. 7.  Aortic Atherosclerosis (ICD10-I70.0). Electronically Signed   By: Kennith Center M.D.   On: 02/13/2023 06:59   CT Head Wo Contrast  Result Date: 02/13/2023 CLINICAL DATA:  Mental status change with unknown cause EXAM: CT HEAD WITHOUT CONTRAST TECHNIQUE: Contiguous axial images were obtained from the base of the skull through the vertex without intravenous contrast. RADIATION DOSE REDUCTION: This exam was performed according to the departmental dose-optimization program which includes automated exposure control, adjustment of the mA and/or kV according to patient size and/or use of iterative reconstruction technique. COMPARISON:  Brain MRI 12/07/2022 FINDINGS: Brain: No evidence of acute infarction, hemorrhage, hydrocephalus, extra-axial collection or mass lesion/mass effect. Expanded and low-density sella, usually incidental in isolation. Generalized atrophy. Vascular: No hyperdense vessel or unexpected calcification. Skull: Normal. Negative for fracture or focal  lesion. Sinuses/Orbits: No acute finding. IMPRESSION: No acute or interval finding. Electronically Signed   By: Tiburcio Pea M.D.   On: 02/13/2023 06:25        Scheduled Meds:  atorvastatin  20 mg Per Tube Daily   budesonide (PULMICORT) nebulizer solution  0.25 mg Nebulization BID   Chlorhexidine Gluconate Cloth  6 each Topical Q0600   [START ON 02/15/2023] Chlorhexidine Gluconate Cloth  6 each Topical Q0600   cholestyramine  4 g Per Tube TID   escitalopram  10 mg Per Tube Daily   heparin  5,000 Units Subcutaneous Q12H   hydrocortisone sod succinate (SOLU-CORTEF) inj  100 mg Intravenous Q8H   hydrOXYzine  50 mg Oral Q12H  ipratropium-albuterol  3 mL Nebulization Q6H   midodrine  20 mg Oral TID WC   mupirocin ointment  1 Application Nasal BID   pantoprazole (PROTONIX) IV  40 mg Intravenous Q24H   Continuous Infusions:  sodium chloride Stopped (02/14/23 1013)   dextrose 5% lactated ringers 40 mL/hr at 02/14/23 0330   fluconazole (DIFLUCAN) IV Stopped (02/14/23 1610)   norepinephrine (LEVOPHED) Adult infusion 7 mcg/min (02/14/23 1012)   piperacillin-tazobactam (ZOSYN)  IV Stopped (02/14/23 0851)     LOS: 1 day   CRITICAL CARE Performed by: Tresa Moore   Total critical care time: 55 minutes  Critical care time was exclusive of separately billable procedures and treating other patients.  Critical care was necessary to treat or prevent imminent or life-threatening deterioration.  Critical care was time spent personally by me on the following activities: development of treatment plan with patient and/or surrogate as well as nursing, discussions with consultants, evaluation of patient's response to treatment, examination of patient, obtaining history from patient or surrogate, ordering and performing treatments and interventions, ordering and review of laboratory studies, ordering and review of radiographic studies, pulse oximetry and re-evaluation of patient's  condition.     Tresa Moore, MD [facility]   If 7PM-7AM, please contact night-coverage  02/14/2023, 1:13 PM

## 2023-02-14 NOTE — ED Notes (Signed)
Attempted to call ICU to give report. ICU RN will call ED RN back!

## 2023-02-14 NOTE — ED Notes (Signed)
Pharmacy to tube Zosyn per [facility] message

## 2023-02-14 NOTE — ED Notes (Signed)
Still missing 0600 dose of Zosyn. Messaged pharmacy about obtaining missing dose.

## 2023-02-15 ENCOUNTER — Inpatient Hospital Stay (HOSPITAL_COMMUNITY)
Admit: 2023-02-15 | Discharge: 2023-02-15 | Disposition: A | Discharge status: Home / Self Care | Payer: Medicare Other | Attending: Student in an Organized Health Care Education/Training Program | Admitting: Student in an Organized Health Care Education/Training Program

## 2023-02-15 DIAGNOSIS — R0609 Other forms of dyspnea: Secondary | ICD-10-CM

## 2023-02-15 DIAGNOSIS — L8990 Pressure ulcer of unspecified site, unspecified stage: Secondary | ICD-10-CM | POA: Insufficient documentation

## 2023-02-15 DIAGNOSIS — J189 Pneumonia, unspecified organism: Secondary | ICD-10-CM | POA: Diagnosis not present

## 2023-02-15 DIAGNOSIS — A419 Sepsis, unspecified organism: Secondary | ICD-10-CM | POA: Diagnosis not present

## 2023-02-15 DIAGNOSIS — L8992 Pressure ulcer of unspecified site, stage 2: Secondary | ICD-10-CM | POA: Insufficient documentation

## 2023-02-15 DIAGNOSIS — R6521 Severe sepsis with septic shock: Secondary | ICD-10-CM | POA: Diagnosis not present

## 2023-02-15 DIAGNOSIS — R6520 Severe sepsis without septic shock: Secondary | ICD-10-CM | POA: Diagnosis not present

## 2023-02-15 DIAGNOSIS — G9341 Metabolic encephalopathy: Secondary | ICD-10-CM | POA: Diagnosis not present

## 2023-02-15 DIAGNOSIS — N3000 Acute cystitis without hematuria: Secondary | ICD-10-CM

## 2023-02-15 DIAGNOSIS — Z931 Gastrostomy status: Secondary | ICD-10-CM

## 2023-02-15 DIAGNOSIS — Z515 Encounter for palliative care: Secondary | ICD-10-CM | POA: Diagnosis not present

## 2023-02-15 LAB — CBC WITH DIFFERENTIAL/PLATELET
Abs Immature Granulocytes: 0.28 10*3/uL — ABNORMAL HIGH (ref 0.00–0.07)
Basophils Absolute: 0.1 10*3/uL (ref 0.0–0.1)
Basophils Relative: 0 %
Eosinophils Absolute: 0.2 10*3/uL (ref 0.0–0.5)
Eosinophils Relative: 1 %
HCT: 38.4 % (ref 36.0–46.0)
Hemoglobin: 12.1 g/dL (ref 12.0–15.0)
Immature Granulocytes: 1 %
Lymphocytes Relative: 2 %
Lymphs Abs: 0.7 10*3/uL (ref 0.7–4.0)
MCH: 26.2 pg (ref 26.0–34.0)
MCHC: 31.5 g/dL (ref 30.0–36.0)
MCV: 83.3 fL (ref 80.0–100.0)
Monocytes Absolute: 0.5 10*3/uL (ref 0.1–1.0)
Monocytes Relative: 2 %
Neutro Abs: 29.5 10*3/uL — ABNORMAL HIGH (ref 1.7–7.7)
Neutrophils Relative %: 94 %
Platelets: 295 10*3/uL (ref 150–400)
RBC: 4.61 MIL/uL (ref 3.87–5.11)
RDW: 14.8 % (ref 11.5–15.5)
WBC: 31.2 10*3/uL — ABNORMAL HIGH (ref 4.0–10.5)
nRBC: 0.1 % (ref 0.0–0.2)

## 2023-02-15 LAB — ECHOCARDIOGRAM COMPLETE
AR max vel: 2.92 cm2
AV Area VTI: 2.92 cm2
AV Area mean vel: 2.89 cm2
AV Mean grad: 7 mm[Hg]
AV Peak grad: 14.1 mm[Hg]
Ao pk vel: 1.88 m/s
Area-P 1/2: 2.37 cm2
Height: 64 in
S' Lateral: 3.6 cm
Weight: 3001.78 [oz_av]

## 2023-02-15 LAB — COMPREHENSIVE METABOLIC PANEL
ALT: 142 U/L — ABNORMAL HIGH (ref 0–44)
AST: 99 U/L — ABNORMAL HIGH (ref 15–41)
Albumin: 2.2 g/dL — ABNORMAL LOW (ref 3.5–5.0)
Alkaline Phosphatase: 90 U/L (ref 38–126)
Anion gap: 10 (ref 5–15)
BUN: 72 mg/dL — ABNORMAL HIGH (ref 8–23)
CO2: 25 mmol/L (ref 22–32)
Calcium: 8.7 mg/dL — ABNORMAL LOW (ref 8.9–10.3)
Chloride: 103 mmol/L (ref 98–111)
Creatinine, Ser: 4.65 mg/dL — ABNORMAL HIGH (ref 0.44–1.00)
GFR, Estimated: 9 mL/min — ABNORMAL LOW (ref >60)
Glucose, Bld: 155 mg/dL — ABNORMAL HIGH (ref 70–99)
Potassium: 4.5 mmol/L (ref 3.5–5.1)
Sodium: 138 mmol/L (ref 135–145)
Total Bilirubin: 0.8 mg/dL (ref 0.3–1.2)
Total Protein: 6.5 g/dL (ref 6.5–8.1)

## 2023-02-15 LAB — MAGNESIUM
Magnesium: 2.1 mg/dL (ref 1.7–2.4)
Magnesium: 2 mg/dL (ref 1.7–2.4)

## 2023-02-15 LAB — GLUCOSE, CAPILLARY
Glucose-Capillary: 108 mg/dL — ABNORMAL HIGH (ref 70–99)
Glucose-Capillary: 111 mg/dL — ABNORMAL HIGH (ref 70–99)
Glucose-Capillary: 124 mg/dL — ABNORMAL HIGH (ref 70–99)
Glucose-Capillary: 93 mg/dL (ref 70–99)
Glucose-Capillary: 95 mg/dL (ref 70–99)

## 2023-02-15 LAB — LACTIC ACID, PLASMA: Lactic Acid, Venous: 1.5 mmol/L (ref 0.5–1.9)

## 2023-02-15 LAB — PHOSPHORUS
Phosphorus: 4.6 mg/dL (ref 2.5–4.6)
Phosphorus: 5.2 mg/dL — ABNORMAL HIGH (ref 2.5–4.6)

## 2023-02-15 MED ORDER — PROSOURCE TF20 ENFIT COMPATIBL EN LIQD
60.0000 mL | Freq: Every day | ENTERAL | Status: DC
  Administered 2023-02-15 – 2023-02-20 (×5): 60 mL
  Filled 2023-02-15 (×5): qty 60

## 2023-02-15 MED ORDER — NEPRO/CARBSTEADY PO LIQD
1000.0000 mL | ORAL | Status: DC
  Administered 2023-02-15 – 2023-02-18 (×3): 1000 mL

## 2023-02-15 MED ORDER — RENA-VITE PO TABS
1.0000 | ORAL_TABLET | Freq: Every day | ORAL | Status: DC
  Administered 2023-02-15 – 2023-02-19 (×5): 1
  Filled 2023-02-15 (×6): qty 1

## 2023-02-15 MED ORDER — AMIODARONE HCL 200 MG PO TABS: 200.0000 mg | ORAL_TABLET | Freq: Two times a day (BID) | ORAL | Status: DC

## 2023-02-15 NOTE — Progress Notes (Signed)
Initial Nutrition Assessment  DOCUMENTATION CODES:   Not applicable  INTERVENTION:  Initiate tube feeding via PEG: Nepro at 45 ml/h (1080 ml per day) Start at 40ml/hr increase 10ml every 8hrs as tolerated.  Prosource TF20 60 ml daily  Provides 2024 kcal, 107 gm protein, 1585 ml free water daily FWF every 6 hours.  Renal vitamin    NUTRITION DIAGNOSIS:   Increased nutrient needs related to chronic illness as evidenced by estimated needs.    GOAL:   Patient will meet greater than or equal to 90% of their needs    MONITOR:   TF tolerance, Labs, Weight trends, I & O's  REASON FOR ASSESSMENT:   Consult Enteral/tube feeding initiation and management  ASSESSMENT:   72 y.o. f admitted from home with sepsis. Pmh; COPD, Asthma, DM, GERD, ESRD (HD dependent) Severe dysphagia status post G-tube, Left AKA, PAF, OSA on CPAP, cirhosis. Due reports of patients current confused status. Family reached spoke with daughter Alexandra Foster. She stated that Patient is on Nepro 1 carton 4 x day with FWF/day. Provides; 1680kcal,76 g pro, FW. Daughter stated that she has been tolerating feedings well and has no question for RD at this time. . All information obtained through EMR and team.   Admit weight: 85.1 kg Current weight: 85.1 kg Weight history:  02/14/23 85.1 kg  02/11/23 86.1 kg  02/04/23 90.7 kg  06/13/21 117.3 kg      Average Meal Intake; NPO EN dependent  Nutritionally Relevant Medications reviewed  Labs Reviewed:  CBG ranges from 174-155 mg/dL over the last 24 hours HgbA1c 4.7    NUTRITION - FOCUSED PHYSICAL EXAM:  Deferred  Diet Order:   Diet Order             Diet NPO time specified  Diet effective now                   EDUCATION NEEDS:   Not appropriate for education at this time  Skin:  Skin Assessment: Skin Integrity Issues: Skin Integrity Issues:: Stage II Stage II: Buttocks  Last BM:  PTA  Height:   Ht Readings from  Last 1 Encounters:  02/13/23 5\' 4"  (1.626 m)    Weight:   Wt Readings from Last 1 Encounters:  02/14/23 85.1 kg    Ideal Body Weight:     BMI:  Body mass index is 32.2 kg/m.  Estimated Nutritional Needs:   Kcal:  1800-2000 kcal/d  Protein:  80-90 g/d  Fluid:  >/= 1590ml/day    Alexandra Foster RDN, LDN Clinical Dietitian  RDN pager # available on Amion

## 2023-02-15 NOTE — Progress Notes (Signed)
[facility]  PROGRESS NOTE   Subjective:   Patient seen in the ICU.  Awake and alert today. Blood pressure stable.  On Levophed. Family at bedside. Permacath was removed yesterday. WBC count is lower today.  Patient is afebrile.  Objective:  Vital signs: Blood pressure 116/60, pulse (!) 56, temperature 98.2 F (36.8 C), temperature source Axillary, resp. rate 20, height 5\' 4"  (1.626 m), weight 85.1 kg, SpO2 95%.  Intake/Output Summary (Last 24 hours) at 02/15/2023 1327 Last data filed at 02/15/2023 1300 Gross per 24 hour  Intake 1622.87 ml  Output 200 ml  Net 1422.87 ml   Filed Weights   02/13/23 0432 02/14/23 1305  Weight: 86 kg 85.1 kg     Physical Exam: General:  No acute distress  Head:  Normocephalic, atraumatic. Moist oral mucosal membranes  Eyes:  Anicteric  Neck:  Supple  Lungs:   Clear to auscultation, normal effort  Heart:  S1S2 no rubs  Abdomen:   Soft, nontender, bowel sounds present  Extremities:  peripheral edema.  Neurologic:  Awake, alert, following commands  Skin:  No lesions  Access:     Basic Metabolic Panel: Recent Labs  Lab 02/13/23 0047 02/13/23 0438 02/14/23 0500 02/15/23 0424  NA  --  136 139 138  K  --  4.5 3.9 4.5  CL  --  105 106 103  CO2  --  20* 16* 25  GLUCOSE  --  109* 174* 155*  BUN  --  52* 58* 72*  CREATININE  --  3.22* 3.94* 4.65*  CALCIUM  --  8.7* 8.4* 8.7*  MG 1.9  --   --   --    GFR: Estimated Creatinine Clearance: 11.2 mL/min (A) (by C-G formula based on SCr of 4.65 mg/dL (H)).  Liver Function Tests: Recent Labs  Lab 02/11/23 1455 02/13/23 0438 02/14/23 0500 02/15/23 0424  AST 144* 139* 127* 99*  ALT 140* 135* 133* 142*  ALKPHOS  --  199* 112 90  BILITOT 0.4 0.9 0.9 0.8  PROT 7.8 7.6 5.8* 6.5  ALBUMIN  --  2.7* 2.0* 2.2*   No results for input(s): "LIPASE", "AMYLASE" in the last 168 hours. Recent Labs  Lab 02/13/23 0047  AMMONIA 23    CBC: Recent Labs  Lab 02/13/23 0047  02/13/23 0629 02/14/23 0500 02/15/23 0424  WBC 33.2* 12.6* 32.9* 31.2*  NEUTROABS 32.0* 6.7  --  29.5*  HGB 14.2 13.1 13.4 12.1  HCT 47.7* 43.5 42.9 38.4  MCV 86.9 85.8 85.0 83.3  PLT 283 297 284 295     HbA1C: Hgb A1c MFr Bld  Date/Time Value Ref Range Status  02/11/2023 02:55 PM 4.7 <5.7 % of total Hgb Final    Comment:    For the purpose of screening for the presence of diabetes: . <5.7%       Consistent with the absence of diabetes 5.7-6.4%    Consistent with increased risk for diabetes             (prediabetes) > or =6.5%  Consistent with diabetes . This assay result is consistent with a decreased risk of diabetes. . Currently, no consensus exists regarding use of hemoglobin A1c for diagnosis of diabetes in children. . According to American Diabetes Association (ADA) guidelines, hemoglobin A1c <7.0% represents optimal control in non-pregnant diabetic patients. Different metrics may apply to specific patient populations.  Standards of Medical Care in Diabetes(ADA). [name]   12/07/2022 02:13 AM 5.2 4.8 - 5.6 % Final  Comment:    (NOTE) Pre diabetes:          5.7%-6.4%  Diabetes:              >6.4%  Glycemic control for   <7.0% adults with diabetes     Urinalysis: Recent Labs    02/13/23 0629  COLORURINE AMBER*  LABSPEC 1.016  PHURINE 5.0  GLUCOSEU NEGATIVE  HGBUR NEGATIVE  BILIRUBINUR NEGATIVE  KETONESUR NEGATIVE  PROTEINUR 30*  NITRITE NEGATIVE  LEUKOCYTESUR LARGE*      Imaging: DG Chest 1 View  Result Date: 02/13/2023 CLINICAL DATA:  Congestive heart failure EXAM: CHEST  1 VIEW COMPARISON:  12/25/2022 FINDINGS: Left central venous catheter with tip over the right atrium. Shallow inspiration. Cardiac enlargement. No vascular congestion, edema, or consolidation. No pleural effusions. No pneumothorax. Mediastinal contours appear intact. Degenerative changes in the spine. IMPRESSION: Shallow inspiration. Cardiac enlargement. No evidence of active  pulmonary disease. Electronically Signed   By: Burman Nieves M.D.   On: 02/13/2023 18:15     Medications:    fluconazole (DIFLUCAN) IV Stopped (02/15/23 0981)   norepinephrine (LEVOPHED) Adult infusion 1 mcg/min (02/15/23 1300)   piperacillin-tazobactam (ZOSYN)  IV Stopped (02/15/23 1914)    atorvastatin  20 mg Per Tube Daily   budesonide (PULMICORT) nebulizer solution  0.25 mg Nebulization BID   Chlorhexidine Gluconate Cloth  6 each Topical Q0600   Chlorhexidine Gluconate Cloth  6 each Topical Q0600   cholestyramine  4 g Per Tube TID   escitalopram  10 mg Per Tube Daily   heparin  5,000 Units Subcutaneous Q12H   hydrocortisone sod succinate (SOLU-CORTEF) inj  100 mg Intravenous Q8H   hydrOXYzine  100 mg Oral BID   ipratropium-albuterol  3 mL Nebulization Q6H   midodrine  20 mg Oral TID WC   mupirocin ointment  1 Application Nasal BID   mouth rinse  15 mL Mouth Rinse 4 times per day   pantoprazole (PROTONIX) IV  40 mg Intravenous Q24H    Assessment/ Plan:     72 year old female with history of hypertension, coronary artery disease, congestive heart failure, COPD, cirrhosis, diabetes, status post left AKA, status post G-tube placement.  Patient is now being admitted for altered mental status.  Her last dialysis treatment was on Thursday.  She is also found to be septic.  The permacath was removed.    #1: End-stage renal disease: Last treatment was on Thursday.  Patient has permacath which most likely is infected.  Spoke to vascular and catheter removed.  Will rest her for today and will reassess her tomorrow for a new groin catheter placement.   #2: Hypotension: Patient is now on Levophed.  Continue midodrine. Continue the same for blood pressure support.  Patient to be maintained in ICU.   #3: Sepsis: Blood cultures done.  Patient is now on Zosyn, vancomycin and fluconazole.   #4: Secondary hyperparathyroidism: Will continue to monitor closely.  Spoke to the patient's daughter  at bedside. Labs and medications reviewed. Will continue to follow along with you.   LOS: 2 Lorain Childes, MD [facility] 11/3/20241:27 PM

## 2023-02-15 NOTE — Progress Notes (Signed)
PROGRESS NOTE    Alexandra Foster  WUJ:[id number] DOB: 12-Dec-1950 DOA: 02/13/2023 PCP: [name], MD    Brief Narrative:   72 y.o. female with medical history significant of ESRD on HD TTS, via a left chest PermCath, asthma/COPD, severe dysphagia status post G-tube, IIDM, HTN, left AKA, PAF, OSA on CPAP, cirrhosis, adrenal insufficiency on Cortef, brought in by family member for evaluation of sepsis.   Patient is confused, or history provided by daughter over the phone and patient's sister at bedside.  Patient started to have altered mentations yesterday and threw up x 1 during dialysis.  Overnight, patient became more confused.  According to daughter, patient's G-tube insertion site has a possible infected as she has been seeing more yellow-greenish pus coming out from around the G-tube entrance and she had to change the gauze of the G-tube 2 times a day to clean the past.  However patient does have a visiting wound care nurse seeing her 2 weeks sick day and according to the wound care team, the G-tube site was not infected.  In addition, daughter also reported that for the last 2 HD sessions, the outpatient HD team reported that the PermCath on left chest " has some narrowing and blood flow very slow" and patient was scheduled to have PermCath exchanged next week.  Patient denied any fever chills no diarrhea no abdominal pain or chest pain.   Assessment & Plan:   Principal Problem:   Sepsis (HCC) Active Problems:   Aspiration pneumonia (HCC)   Septic shock (HCC)   Gastrostomy tube in place (HCC)   Pressure injury of skin  Septic shock Patient has had low blood pressure despite multiple fluid boluses.  Sepsis criteria met with leukocytosis, tachycardia, fever.  Potential sources include chest dialysis catheter versus gastric tube.  Higher suspicion that HD catheter site may be infected. Patient's maps remained low despite treatment with oral midodrine and IV stress dose  steroids Plan: Patient remains in ICU.  Peripheral Levophed running at 5 mcg/h.  Wean as tolerated.  Continue midodrine 20 mg 3 times daily via tube and hydrocortisone 100 mg IV every 8 hours.  Continue broad-spectrum antimicrobial and antifungal coverage.  Maintain patient without dialysis catheter.  Surgery evaluated PEG tube site is possible septic focus.  This was felt unlikely.  No surgical intervention warranted at this time.  Continue with daily labs.  Monitor culture data.  Acute metabolic encephalopathy Likely secondary to sepsis Mentating reasonably clearly on my evaluation   Acute transaminitis Downtrending, likely related to sepsis Daily LFTs   ESRD on HD with access difficulties Case discussed with on-call nephrology.  Patient was last dialyzed Thursday 10/31.  Given recent line sepsis and poor flow through catheter this is felt to be the infectious source and vascular was engaged.  PermCath was removed 11/2 with plans for line holiday.  Tentative plan to replace dialysis catheter 11/4.   PAF Patient not on anticoagulation for unclear reasons.  Amiodarone restarted.  Continue to monitor on telemetry.  G-tube Severe dysphagia General Surgery consult requested and appreciated.  Patient will remain n.p.o. as she has severe dysphagia.  All medications via tube.  Tube feeds to be restarted 11/3.  Chronic adrenal insufficiency Midodrine 20 mg 3 times daily.  Stress dose steroids 1 mg IV every 8 hours  OSA CPAP nightly   DVT prophylaxis: SQ heparin Code Status: Full Family Communication:Daughter [name] [phone] on 11/2 Disposition Plan: Status is: Inpatient Remains inpatient appropriate because: Multiple acute issues  as above   Level of care: ICU  Consultants:  PCCM Nephrology Vascular surgery Palliative care General Surgery  Procedures:  None  Antimicrobials: Vancomycin Zosyn Fluconazole   Subjective: Seen and examined.  Remains in ED.  Mentation  waxing and waning.  Objective: Vitals:   02/15/23 1200 02/15/23 1230 02/15/23 1245 02/15/23 1300  BP: (!) 107/57 116/61 (!) 81/61 116/60  Pulse: (!) 54 (!) 58 (!) 57 (!) 56  Resp: 15 15 19 20   Temp:  98.2 F (36.8 C)    TempSrc:  Axillary    SpO2: 95% 95% 95% 95%  Weight:      Height:        Intake/Output Summary (Last 24 hours) at 02/15/2023 1438 Last data filed at 02/15/2023 1300 Gross per 24 hour  Intake 1622.87 ml  Output 200 ml  Net 1422.87 ml   Filed Weights   02/13/23 0432 02/14/23 1305  Weight: 86 kg 85.1 kg    Examination:  General exam: Appears chronically ill Respiratory system: Lungs clear overall.  Normal work of breathing.  2 L Cardiovascular system: S1-S2, regular rate, irregular rhythm, no murmurs, no pedal edema Gastrointestinal system: Soft, NT/ND, normal bowel sounds, + G-tube Central nervous system: Oriented x 2.  No focal deficits Extremities: Decreased power bilateral lower extremities.  Gait not assessed Skin: No rashes, lesions or ulcers Psychiatry: Judgement and insight appear impaired. Mood & affect flattened.     Data Reviewed: I have personally reviewed following labs and imaging studies  CBC: Recent Labs  Lab 02/13/23 0047 02/13/23 0629 02/14/23 0500 02/15/23 0424  WBC 33.2* 12.6* 32.9* 31.2*  NEUTROABS 32.0* 6.7  --  29.5*  HGB 14.2 13.1 13.4 12.1  HCT 47.7* 43.5 42.9 38.4  MCV 86.9 85.8 85.0 83.3  PLT 283 297 284 295   Basic Metabolic Panel: Recent Labs  Lab 02/13/23 0047 02/13/23 0438 02/14/23 0500 02/15/23 0424 02/15/23 1344  NA  --  136 139 138  --   K  --  4.5 3.9 4.5  --   CL  --  105 106 103  --   CO2  --  20* 16* 25  --   GLUCOSE  --  109* 174* 155*  --   BUN  --  52* 58* 72*  --   CREATININE  --  3.22* 3.94* 4.65*  --   CALCIUM  --  8.7* 8.4* 8.7*  --   MG 1.9  --   --   --  2.1  PHOS  --   --   --   --  4.6   GFR: Estimated Creatinine Clearance: 11.2 mL/min (A) (by C-G formula based on SCr of 4.65 mg/dL  (H)). Liver Function Tests: Recent Labs  Lab 02/11/23 1455 02/13/23 0438 02/14/23 0500 02/15/23 0424  AST 144* 139* 127* 99*  ALT 140* 135* 133* 142*  ALKPHOS  --  199* 112 90  BILITOT 0.4 0.9 0.9 0.8  PROT 7.8 7.6 5.8* 6.5  ALBUMIN  --  2.7* 2.0* 2.2*   No results for input(s): "LIPASE", "AMYLASE" in the last 168 hours. Recent Labs  Lab 02/13/23 0047  AMMONIA 23   Coagulation Profile: Recent Labs  Lab 02/13/23 0438  INR 1.2   Cardiac Enzymes: No results for input(s): "CKTOTAL", "CKMB", "CKMBINDEX", "TROPONINI" in the last 168 hours. BNP (last 3 results) No results for input(s): "PROBNP" in the last 8760 hours. HbA1C: No results for input(s): "HGBA1C" in the last 72 hours.  CBG: Recent  Labs  Lab 02/14/23 1529 02/14/23 2008 02/14/23 2327 02/15/23 0421 02/15/23 1231  GLUCAP 159* 140* 154* 124* 108*   Lipid Profile: No results for input(s): "CHOL", "HDL", "LDLCALC", "TRIG", "CHOLHDL", "LDLDIRECT" in the last 72 hours.  Thyroid Function Tests: No results for input(s): "TSH", "T4TOTAL", "FREET4", "T3FREE", "THYROIDAB" in the last 72 hours.  Anemia Panel: No results for input(s): "VITAMINB12", "FOLATE", "FERRITIN", "TIBC", "IRON", "RETICCTPCT" in the last 72 hours. Sepsis Labs: Recent Labs  Lab 02/13/23 0629 02/14/23 0500 02/14/23 0845 02/14/23 1655  LATICACIDVEN 1.3 5.5* 3.8* 4.6*    Recent Results (from the past 240 hour(s))  Culture, blood (Routine x 2)     Status: None (Preliminary result)   Collection Time: 02/13/23  4:39 AM   Specimen: BLOOD  Result Value Ref Range Status   Specimen Description BLOOD BLOOD LEFT ARM  Final   Special Requests   Final    BOTTLES DRAWN AEROBIC AND ANAEROBIC Blood Culture adequate volume   Culture   Final    NO GROWTH 2 DAYS Performed at [facility], [address], [place], [place] [address]    Report Status PENDING  Incomplete  Culture, blood (Routine x 2)     Status: None (Preliminary result)    Collection Time: 02/13/23  4:39 AM   Specimen: BLOOD  Result Value Ref Range Status   Specimen Description BLOOD BLOOD RIGHT ARM  Final   Special Requests   Final    BOTTLES DRAWN AEROBIC AND ANAEROBIC Blood Culture adequate volume   Culture   Final    NO GROWTH 2 DAYS Performed at [facility], [address], [place], [place] [address]    Report Status PENDING  Incomplete  Urine Culture     Status: Abnormal   Collection Time: 02/13/23  6:29 AM   Specimen: Urine, Random  Result Value Ref Range Status   Specimen Description   Final    URINE, RANDOM Performed at [facility], [address], [place], [place] [address]    Special Requests   Final    NONE Reflexed from [phone] Performed at [facility], [address] [place], [place] [address]    Culture MULTIPLE SPECIES PRESENT, SUGGEST RECOLLECTION (A)  Final   Report Status 02/14/2023 FINAL  Final  Resp panel by RT-PCR (RSV, Flu A&B, Covid) Anterior Nasal Swab     Status: None   Collection Time: 02/13/23  1:30 PM   Specimen: Anterior Nasal Swab  Result Value Ref Range Status   SARS Coronavirus 2 by RT PCR NEGATIVE NEGATIVE Final    Comment: (NOTE) SARS-CoV-2 target nucleic acids are NOT DETECTED.  The SARS-CoV-2 RNA is generally detectable in upper respiratory specimens during the acute phase of infection. The lowest concentration of SARS-CoV-2 viral copies this assay can detect is 138 copies/mL. A negative result does not preclude SARS-Cov-2 infection and should not be used as the sole basis for treatment or other patient management decisions. A negative result may occur with  improper specimen collection/handling, submission of specimen other than nasopharyngeal swab, presence of viral mutation(s) within the areas targeted by this assay, and inadequate number of viral copies(<138 copies/mL). A negative result must be combined with clinical observations, patient history, and  epidemiological information. The expected result is Negative.  Fact Sheet for Patients:  BloggerCourse.com  Fact Sheet for Healthcare Providers:  SeriousBroker.it  This test is no t yet approved or cleared by the [place] and  has been authorized for  detection and/or diagnosis of SARS-CoV-2 by FDA under an Emergency Use Authorization (EUA). This EUA will remain  in effect (meaning this test can be used) for the duration of the COVID-19 declaration under Section 564(b)(1) of the Act, 21 U.S.C.section 360bbb-3(b)(1), unless the authorization is terminated  or revoked sooner.       Influenza A by PCR NEGATIVE NEGATIVE Final   Influenza B by PCR NEGATIVE NEGATIVE Final    Comment: (NOTE) The Xpert Xpress SARS-CoV-2/FLU/RSV plus assay is intended as an aid in the diagnosis of influenza from Nasopharyngeal swab specimens and should not be used as a sole basis for treatment. Nasal washings and aspirates are unacceptable for Xpert Xpress SARS-CoV-2/FLU/RSV testing.  Fact Sheet for Patients: BloggerCourse.com  Fact Sheet for Healthcare Providers: SeriousBroker.it  This test is not yet approved or cleared by the [place] FDA and has been authorized for detection and/or diagnosis of SARS-CoV-2 by FDA under an Emergency Use Authorization (EUA). This EUA will remain in effect (meaning this test can be used) for the duration of the COVID-19 declaration under Section 564(b)(1) of the Act, 21 U.S.C. section 360bbb-3(b)(1), unless the authorization is terminated or revoked.     Resp Syncytial Virus by PCR NEGATIVE NEGATIVE Final    Comment: (NOTE) Fact Sheet for Patients: BloggerCourse.com  Fact Sheet for Healthcare Providers: SeriousBroker.it  This test is not yet approved or cleared by the [place] FDA and has been  authorized for detection and/or diagnosis of SARS-CoV-2 by FDA under an Emergency Use Authorization (EUA). This EUA will remain in effect (meaning this test can be used) for the duration of the COVID-19 declaration under Section 564(b)(1) of the Act, 21 U.S.C. section 360bbb-3(b)(1), unless the authorization is terminated or revoked.  Performed at [facility], [address] [place], [place] [address]   MRSA Next Gen by PCR, Nasal     Status: Abnormal   Collection Time: 02/13/23  9:05 PM   Specimen: Nasal Mucosa; Nasal Swab  Result Value Ref Range Status   MRSA by PCR Next Gen DETECTED (A) NOT DETECTED Final    Comment: CRITICAL RESULT CALLED TO, READ BACK BY AND VERIFIED WITH: Mosetta Putt RN @ 2300 02/13/23 [facility] (NOTE) The GeneXpert MRSA Assay (FDA approved for NASAL specimens only), is one component of a comprehensive MRSA colonization surveillance program. It is not intended to diagnose MRSA infection nor to guide or monitor treatment for MRSA infections. Test performance is not FDA approved in patients less than 15 years old. Performed at [facility], [address] [place], [place] [address]   MRSA Next Gen by PCR, Nasal     Status: Abnormal   Collection Time: 02/14/23  1:15 PM   Specimen: Nasal Mucosa; Nasal Swab  Result Value Ref Range Status   MRSA by PCR Next Gen DETECTED (A) NOT DETECTED Final    Comment: RESULT CALLED TO, READ BACK BY AND VERIFIED WITH: BRITTNEY MANSFIELD AT 1614 02/14/23.[facility] (NOTE) The GeneXpert MRSA Assay (FDA approved for NASAL specimens only), is one component of a comprehensive MRSA colonization surveillance program. It is not intended to diagnose MRSA infection nor to guide or monitor treatment for MRSA infections. Test performance is not FDA approved in patients less than 70 years old. Performed at [facility], [address] [place], [place] [address]   Aerobic Culture w Gram Stain (superficial  specimen)     Status: None (Preliminary result)   Collection Time: 02/14/23  4:55 PM   Specimen:  Catheter Tip  Result Value Ref Range Status   Specimen Description   Final    CATH TIP Performed at [facility], [address], [place], [place] [address]    Special Requests   Final    NONE Performed at [facility], [address] [place], [place] [address]    Gram Stain   Final    RARE WBC PRESENT, PREDOMINANTLY PMN NO ORGANISMS SEEN Performed at [facility], [address], [place], [place] [address]    Culture PENDING  Incomplete   Report Status PENDING  Incomplete  Anaerobic culture w Gram Stain     Status: None (Preliminary result)   Collection Time: 02/14/23  4:55 PM   Specimen: Catheter Tip  Result Value Ref Range Status   Specimen Description   Final    CATH TIP Performed at [facility], [address] [place], [place] [address]    Special Requests   Final    NONE Performed at [facility], [address] [place], [place] [address]    Gram Stain   Final    RARE WBC PRESENT, PREDOMINANTLY PMN NO ORGANISMS SEEN Performed at [facility], [address] [place], [place] [address]    Culture PENDING  Incomplete   Report Status PENDING  Incomplete         Radiology Studies: DG Chest 1 View  Result Date: 02/13/2023 CLINICAL DATA:  Congestive heart failure EXAM: CHEST  1 VIEW COMPARISON:  12/25/2022 FINDINGS: Left central venous catheter with tip over the right atrium. Shallow inspiration. Cardiac enlargement. No vascular congestion, edema, or consolidation. No pleural effusions. No pneumothorax. Mediastinal contours appear intact. Degenerative changes in the spine. IMPRESSION: Shallow inspiration. Cardiac enlargement. No evidence of active pulmonary disease. Electronically Signed   By: Burman Nieves M.D.   On: 02/13/2023 18:15        Scheduled Meds:  atorvastatin  20 mg Per Tube Daily   budesonide  (PULMICORT) nebulizer solution  0.25 mg Nebulization BID   Chlorhexidine Gluconate Cloth  6 each Topical Q0600   Chlorhexidine Gluconate Cloth  6 each Topical Q0600   cholestyramine  4 g Per Tube TID   escitalopram  10 mg Per Tube Daily   feeding supplement (PROSource TF20)  60 mL Per Tube Daily   heparin  5,000 Units Subcutaneous Q12H   hydrocortisone sod succinate (SOLU-CORTEF) inj  100 mg Intravenous Q8H   hydrOXYzine  100 mg Oral BID   ipratropium-albuterol  3 mL Nebulization Q6H   midodrine  20 mg Oral TID WC   multivitamin  1 tablet Per Tube QHS   mupirocin ointment  1 Application Nasal BID   mouth rinse  15 mL Mouth Rinse 4 times per day   pantoprazole (PROTONIX) IV  40 mg Intravenous Q24H   Continuous Infusions:  feeding supplement (NEPRO CARB STEADY)     fluconazole (DIFLUCAN) IV Stopped (02/15/23 7253)   norepinephrine (LEVOPHED) Adult infusion 1 mcg/min (02/15/23 1300)   piperacillin-tazobactam (ZOSYN)  IV Stopped (02/15/23 0621)     LOS: 2 days  CRITICAL CARE Performed by: Tresa Moore   Total critical care time: 45 minutes  Critical care time was exclusive of separately billable procedures and treating other patients.  Critical care was necessary to treat or prevent imminent or life-threatening deterioration.  Critical care was time spent personally by me on the following activities: development of treatment plan with patient and/or surrogate as well as nursing,  discussions with consultants, evaluation of patient's response to treatment, examination of patient, obtaining history from patient or surrogate, ordering and performing treatments and interventions, ordering and review of laboratory studies, ordering and review of radiographic studies, pulse oximetry and re-evaluation of patient's condition.     Tresa Moore, MD [facility]   If 7PM-7AM, please contact night-coverage  02/15/2023, 2:38 PM

## 2023-02-15 NOTE — Progress Notes (Signed)
*  PRELIMINARY RESULTS* Echocardiogram 2D Echocardiogram has been performed.  Earlie Server Zniyah Midkiff 02/15/2023, 11:11 AM

## 2023-02-15 NOTE — Plan of Care (Signed)
  Problem: Clinical Measurements: Goal: Respiratory complications will improve Outcome: Progressing   Problem: Activity: Goal: Risk for activity intolerance will decrease 02/15/2023 0211 by Johnell Comings, RN Outcome: Progressing 02/15/2023 0210 by Johnell Comings, RN Outcome: Progressing   Problem: Coping: Goal: Level of anxiety will decrease Outcome: Progressing   Problem: Elimination: Goal: Will not experience complications related to bowel motility Outcome: Progressing Goal: Will not experience complications related to urinary retention Outcome: Progressing   Problem: Pain Management: Goal: General experience of comfort will improve 02/15/2023 0211 by Johnell Comings, RN Outcome: Progressing 02/15/2023 0210 by Johnell Comings, RN Outcome: Progressing   Problem: Safety: Goal: Ability to remain free from injury will improve Outcome: Progressing

## 2023-02-15 NOTE — Consult Note (Signed)
[facility] SUR[facility] CON[facility]ULTATION NOTE (initial) - cpt: 09811   HISTORY OF PRESENT ILLNESS (HPI):  72 y.o. female present in the [facility] ICU today for evaluation of her PEG tube.  PEG tube placed about a year and a half ago, with tube changes as expected.  Patient reports no pain at PEG tube site, family was concerned about some recent greenish drainage around the gastrostomy tube.  Apparently there was some finding on CT scan, that contributed to the concern that this may be a source of sepsis.  Surgery is consulted by attending physician Dr. Georgeann Oppenheim in this context for evaluation and management of potential sepsis from PEG tube site.  PAST MEDICAL HISTORY (PMH):  Past Medical History:  Diagnosis Date   AKI (acute kidney injury) (HCC)    a. 04/2021 in setting of bacteremia/shock.   Arthritis    Asthma    Bacteremia    a. 04/2021 S pyogenes bacteremia in setting of lower ext cellulitis.   COPD (chronic obstructive pulmonary disease) (HCC)    Diabetes mellitus without complication (HCC)    Endometriosis    GERD (gastroesophageal reflux disease)    History of echocardiogram    a. 07/2013 Echo: EF 55-60%, impaired relaxation, mild TR; b. 04/2021 Echo: EF 50-55%, mild LVH, nl RV fxn, mild BAE, Ao sclerosis w/o stenosis.   Hypertension    Obesity    PAF (paroxysmal atrial fibrillation) (HCC)    a. 04/2021 in setting of septic shock/cellulitis.   Sleep apnea    CPAP     PAST SURGICAL HISTORY (PSH):  Past Surgical History:  Procedure Laterality Date   ABDOMINAL HYSTERECTOMY     APPLICATION OF WOUND VAC Left 05/17/2021   Procedure: APPLICATION OF WOUND VAC/WOUND VAC EXCHANGE-Matrix Myriad;  Surgeon: Carolan Shiver, MD;  Location: [facility];  Service: General;  Laterality: Left;   APPLICATION OF WOUND VAC  05/24/2021   Procedure: APPLICATION OF WOUND VAC;  Surgeon: Carolan Shiver, MD;  Location: [facility];  Service: General;;   APPLICATION OF WOUND VAC  05/10/2021    Procedure: APPLICATION OF WOUND VAC;  Surgeon: Carolan Shiver, MD;  Location: [facility];  Service: General;;   COLONOSCOPY  10/29/2006   Dr Servando Snare   COLONOSCOPY WITH PROPOFOL N/A 11/05/2016   Procedure: COLONOSCOPY WITH PROPOFOL;  Surgeon: Earline Mayotte, MD;  Location: [facility];  Service: Endoscopy;  Laterality: N/A;   DIALYSIS/PERMA CATHETER INSERTION N/A 12/19/2022   Procedure: DIALYSIS/PERMA CATHETER INSERTION;  Surgeon: Annice Needy, MD;  Location: [facility];  Service: Cardiovascular;  Laterality: N/A;   DIALYSIS/PERMA CATHETER INSERTION N/A 02/04/2023   Procedure: DIALYSIS/PERMA CATHETER INSERTION;  Surgeon: Annice Needy, MD;  Location: [facility];  Service: Cardiovascular;  Laterality: N/A;   INCISION AND DRAINAGE OF WOUND Left 05/24/2021   Procedure: IRRIGATION AND DEBRIDEMENT LEFT LEG;  Surgeon: Carolan Shiver, MD;  Location: [facility];  Service: General;  Laterality: Left;   INCISION AND DRAINAGE OF WOUND Left 05/10/2021   Procedure: IRRIGATION AND DEBRIDEMENT LEFT LEG;  Surgeon: Carolan Shiver, MD;  Location: [facility];  Service: General;  Laterality: Left;   IR FLUORO GUIDE CV LINE RIGHT  06/12/2021   IR PERC TUN PERIT CATH WO PORT S&I /IMAG  06/12/2021   IR REPLC GASTRO/COLONIC TUBE PERCUT W/FLUORO  06/12/2021   IVC FILTER INSERTION N/A 05/29/2021   Procedure: IVC FILTER INSERTION;  Surgeon: Renford Dills, MD;  Location: [facility];  Service: Cardiovascular;  Laterality: N/A;  MINOR GRAFT APPLICATION  05/24/2021   Procedure: Myriad Matrix  APPLICATION;  Surgeon: Carolan Shiver, MD;  Location: [facility];  Service: General;;   NASAL SINUS SURGERY  2002   Dr Chestine Spore   PEG PLACEMENT N/A 05/28/2021   Procedure: PERCUTANEOUS ENDOSCOPIC GASTROSTOMY (PEG) PLACEMENT;  Surgeon: Sung Amabile, DO;  Location: [facility];  Service: General;  Laterality: N/A;  TRAVEL CASE   TRACHEOSTOMY TUBE PLACEMENT N/A 05/17/2021   Procedure:  TRACHEOSTOMY;  Surgeon: Geanie Logan, MD;  Location: [facility];  Service: ENT;  Laterality: N/A;   WOUND DEBRIDEMENT Left 05/07/2021   Procedure: DEBRIDEMENT WOUND;  Surgeon: Carolan Shiver, MD;  Location: [facility];  Service: General;  Laterality: Left;     MEDICATIONS:  Prior to Admission medications   Medication Sig Start Date End Date Taking? Authorizing Provider  acetaminophen (TYLENOL) 325 MG tablet Place 2 tablets (650 mg total) into feeding tube every 6 (six) hours as needed for mild pain (or Fever >/= 101). 12/29/22  Yes Wieting, Richard, MD  amiodarone (PACERONE) 200 MG tablet Place 1 tablet (200 mg total) into feeding tube 2 (two) times daily. 02/12/23  Yes Sowles, [name], MD  ascorbic acid (VITAMIN C) 500 MG tablet Place 1 tablet (500 mg total) into feeding tube 2 (two) times daily. 06/13/21  Yes Erin Fulling, MD  atorvastatin (LIPITOR) 20 MG tablet Place 1 tablet (20 mg total) into feeding tube daily. 02/12/23  Yes Sowles, [name], MD  cholestyramine Lanetta Inch) 4 g packet Place 1 packet (4 g total) into feeding tube 3 (three) times daily. 02/11/23  Yes Sowles, [name], MD  clonazePAM (KLONOPIN) 0.5 MG tablet Take 0.5-1 tablets (0.25-0.5 mg total) by mouth daily as needed for anxiety. 02/11/23  Yes Sowles, [name], MD  DAPTOmycin 500 mg in sodium chloride 0.9 % 50 mL Inject 500 mg into the vein Every Tuesday,Thursday,and Saturday with dialysis. Stop after 01/03/23 dose (given with dialysis) 12/30/22  Yes Wieting, Richard, MD  diphenoxylate-atropine (LOMOTIL) 2.5-0.025 MG tablet 1 tablet by Per J Tube route 4 (four) times daily as needed for diarrhea or loose stools. 12/29/22  Yes Wieting, Richard, MD  escitalopram (LEXAPRO) 10 MG tablet Place 1 tablet (10 mg total) into feeding tube daily. 02/11/23  Yes Sowles, [name], MD  famotidine (PEPCID) 10 MG tablet Place 1 tablet (10 mg total) into feeding tube daily. 02/11/23  Yes Sowles, [name], MD  fludrocortisone (FLORINEF) 0.1 MG tablet  Place 2 tablets (0.2 mg total) into feeding tube daily. 12/30/22  Yes Wieting, Richard, MD  hydrOXYzine (ATARAX) 50 MG tablet Take 1 tablet (50 mg total) by mouth 3 (three) times daily as needed. Patient taking differently: Take 100 mg by mouth 2 (two) times daily. 02/11/23  Yes Sowles, [name], MD  metoCLOPramide (REGLAN) 5 MG tablet Take 1 tablet (5 mg total) by mouth 3 (three) times daily before meals. 02/11/23  Yes Sowles, [name], MD  midodrine (PROAMATINE) 10 MG tablet Place 2 tablets (20 mg total) into feeding tube 3 (three) times daily with meals. 02/12/23  Yes Sowles, [name], MD  multivitamin (RENA-VIT) TABS tablet Place 1 tablet into feeding tube at bedtime. 02/04/23  Yes Sowles, [name], MD  nystatin (MYCOSTATIN/NYSTOP) powder Apply topically 3 (three) times daily. 12/29/22  Yes Wieting, Richard, MD  polyvinyl alcohol (LIQUIFILM TEARS) 1.4 % ophthalmic solution Place 1 drop into both eyes as needed for dry eyes. 12/29/22  Yes Alford Highland, MD  traZODone (DESYREL) 50 MG tablet Take 0.5 tablets (25 mg total) by mouth at bedtime as  needed for sleep. 02/11/23  Yes Sowles, [name], MD  vitamin D3 (CHOLECALCIFEROL) 25 MCG tablet Place 2 tablets (2,000 Units total) into feeding tube daily. 01/02/23  Yes Enedina Finner, MD  Nutritional Supplements (FEEDING SUPPLEMENT, NEPRO CARB STEADY,) LIQD Place 1,120 mLs into feeding tube daily. 12/29/22   Alford Highland, MD  Nutritional Supplements (FEEDING SUPPLEMENT, NEPRO CARB STEADY,) LIQD Place 1,120 mLs into feeding tube daily. 01/02/23   Enedina Finner, MD  Water For Irrigation, Sterile (FREE WATER) SOLN Place 50 mLs into feeding tube every 4 (four) hours. 12/29/22   Alford Highland, MD     ALLERGIES:  No Known Allergies   SOCIAL HISTORY:  Social History   Socioeconomic History   Marital status: Married    Spouse name: [name]   Number of children: 1   Years of education: 12   Highest education level: 12th grade  Occupational History    Employer:  RETIRED  Tobacco Use   Smoking status: Former    Current packs/day: 0.00    Average packs/day: 2.0 packs/day for 40.0 years (80.0 ttl pk-yrs)    Types: Cigarettes    Start date: 1963    Quit date: 2003    Years since quitting: 21.8   Smokeless tobacco: Former    Types: Snuff    Quit date: 04/2001   Tobacco comments:    smoking cessation materials not required  Vaping Use   Vaping status: Never Used  Substance and Sexual Activity   Alcohol use: No    Alcohol/week: 0.0 standard drinks of alcohol   Drug use: No   Sexual activity: Not Currently  Other Topics Concern   Not on file  Social History Narrative   Not on file   Social Determinants of Health   Financial Resource Strain: Medium Risk (08/09/2020)   Overall Financial Resource Strain (CARDIA)    Difficulty of Paying Living Expenses: Somewhat hard  Food Insecurity: Patient Unable To Answer (12/06/2022)   Hunger Vital Sign    Worried About Running Out of Food in the Last Year: Patient unable to answer    Ran Out of Food in the Last Year: Patient unable to answer  Transportation Needs: No Transportation Needs (12/06/2022)   PRAPARE - Administrator, Civil Service (Medical): No    Lack of Transportation (Non-Medical): No  Physical Activity: Inactive (08/09/2020)   Exercise Vital Sign    Days of Exercise per Week: 0 days    Minutes of Exercise per Session: 0 min  Stress: No Stress Concern Present (08/09/2020)   [employer] of Occupational Health - Occupational Stress Questionnaire    Feeling of Stress : Not at all  Social Connections: Moderately Integrated (08/09/2020)   Social Connection and Isolation Panel [NHANES]    Frequency of Communication with Friends and Family: More than three times a week    Frequency of Social Gatherings with Friends and Family: More than three times a week    Attends Religious Services: More than 4 times per year    Active Member of [employer] or Organizations: No    Attends Tax inspector Meetings: Never    Marital Status: Married  Catering manager Violence: Patient Unable To Answer (12/06/2022)   Humiliation, Afraid, Rape, and Kick questionnaire    Fear of Current or Ex-Partner: Patient unable to answer    Emotionally Abused: Patient unable to answer    Physically Abused: Patient unable to answer    Sexually Abused: Patient unable to answer  FAMILY HISTORY:  Family History  Problem Relation Age of Onset   Congestive Heart Failure Mother    Coronary artery disease Father 37   Breast cancer Sister 54   Heart disease Brother    Varicose Veins Brother    Alcohol abuse Brother       REVIEW OF SYSTEMS:  Review of Systems  Unable to perform ROS: Acuity of condition    VITAL SIGNS:  Temp:  [97.9 F (36.6 C)-99.8 F (37.7 C)] 97.9 F (36.6 C) (11/03 0800) Pulse Rate:  [56-80] 58 (11/03 1000) Resp:  [12-29] 15 (11/03 1000) BP: (79-134)/(41-115) 104/58 (11/03 1000) SpO2:  [92 %-99 %] 96 % (11/03 1000) Weight:  [85.1 kg] 85.1 kg (11/02 1305)     Height: 5\' 4"  (162.6 cm) Weight: 85.1 kg BMI (Calculated): 32.19   INTAKE/OUTPUT:  11/02 0701 - 11/03 0700 In: 1640.8 [I.V.:857.9; IV Piggyback:452.9] Out: 100 [Urine:100]  PHYSICAL EXAM:  Physical Exam Blood pressure (!) 104/58, pulse (!) 58, temperature 97.9 F (36.6 C), temperature source Axillary, resp. rate 15, height 5\' 4"  (1.626 m), weight 85.1 kg, SpO2 96%. Last Weight  Most recent update: 02/14/2023  1:14 PM    Weight  85.1 kg (187 lb 9.8 oz)             CONSTITUTIONAL: Well developed, morbidly obese, appropriately responsive.   EYES: Sclera non-icteric.   EARS, NOSE, MOUTH AND THROAT:  The oropharynx is clear. Oral mucosa is pink and moist.    Hearing is intact to voice.  NECK: Trachea is midline, and there is no jugular venous distension.  LYMPH NODES:  Lymph nodes in the neck are not enlarged. RESPIRATORY:   Normal respiratory effort without pathologic use of accessory  muscles. CARDIOVASCULAR:  Well perfused.  GI: The abdomen is notable for left upper quadrant PEG tube site with dressing around it.  There is a small amount of green drainage on the dressing.  The tube was noted to have a degree of slack in it, which was also noted on the CT scan.  The external flange was not snug against the skin, and therefore allowing some of the gastric drainage to come around the tube at the exit site.  The surrounding skin is without induration, adjacent abdomen is soft, nontender, and nondistended. There were no palpable masses. I did not appreciate hepatosplenomegaly. MUSCULOSKELETAL: No obvious wounds or deformity, warm.  SKIN: Skin turgor is normal. No pathologic skin lesions appreciated.  NEUROLOGIC: Alert and appropriately responsive. PSYCH:  Affect is appropriate for situation.  Data Reviewed I have personally reviewed what is currently available of the patient's imaging, recent labs and medical records.    Labs:     Latest Ref Rng & Units 02/15/2023    4:24 AM 02/14/2023    5:00 AM 02/13/2023    6:29 AM  CBC  WBC 4.0 - 10.5 K/uL 31.2  32.9  12.6   Hemoglobin 12.0 - 15.0 g/dL 14.7  82.9  56.2   Hematocrit 36.0 - 46.0 % 38.4  42.9  43.5   Platelets 150 - 400 K/uL 295  284  297       Latest Ref Rng & Units 02/15/2023    4:24 AM 02/14/2023    5:00 AM 02/13/2023    4:38 AM  CMP  Glucose 70 - 99 mg/dL 130  865  784   BUN 8 - 23 mg/dL 72  58  52   Creatinine 0.44 - 1.00 mg/dL 6.96  2.95  3.22   Sodium 135 - 145 mmol/L 138  139  136   Potassium 3.5 - 5.1 mmol/L 4.5  3.9  4.5   Chloride 98 - 111 mmol/L 103  106  105   CO2 22 - 32 mmol/L 25  16  20    Calcium 8.9 - 10.3 mg/dL 8.7  8.4  8.7   Total Protein 6.5 - 8.1 g/dL 6.5  5.8  7.6   Total Bilirubin 0.3 - 1.2 mg/dL 0.8  0.9  0.9   Alkaline Phos 38 - 126 U/L 90  112  199   AST 15 - 41 U/L 99  127  139   ALT 0 - 44 U/L 142  133  135      Imaging studies:   Last 24 hrs: No results  found.   Assessment/Plan:  72 y.o. female with out evidence of longstanding gastrostomy tube as source of current sepsis, complicated by pertinent comorbidities including:  Patient Active Problem List   Diagnosis Date Noted   Septic shock (HCC) 02/14/2023   Aspiration pneumonia (HCC) 02/13/2023   Adrenal insufficiency (HCC) 02/13/2023   Malnutrition (HCC) 02/11/2023   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   Cholelithiasis without cholecystitis 02/11/2023   Bowel and bladder incontinence 02/11/2023   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   Diarrhea 12/28/2022   Anemia of chronic disease 12/24/2022   Anxiety and depression 12/06/2022   Type 2 diabetes mellitus with chronic kidney disease, with long-term current use of insulin (HCC) 12/06/2022   ESRD on hemodialysis (HCC) 12/06/2022   Subdural hemorrhage (HCC) 05/18/2021   Atrial fibrillation and flutter (HCC)    Sepsis (HCC) 05/04/2021   Personal history of COVID-19 07/14/2019   Osteoarthritis of both knees 01/14/2018   Urge incontinence 03/14/2016   Osteopenia 02/13/2016   Allergic rhinitis 04/13/2015   COPD (chronic obstructive pulmonary disease) (HCC) 09/18/2014   Esophagitis, reflux 09/18/2014    -Surgery to sign off, if we may be of further help please feel free to call.     -As discussed with nursing would keep flange snug against the skin, and prevent play or sliding of any excess tube into the patient as possible.  This will limit the amount of peri-G-tube drainage.  All of the above findings and recommendations were discussed with the patient and  family(if present), and all of patient's and present family's questions were answered to their expressed satisfaction.  Thank you for the opportunity to participate in this patient's care.   -- Campbell Lerner, M.D., FACS 02/15/2023, 10:49 AM

## 2023-02-15 NOTE — Progress Notes (Signed)
Daily Progress Note   Patient Name: Alexandra Foster       Date: 02/15/2023 DOB: 1950/12/15  Age: 72 y.o. MRN#: [id number] Attending Physician: Tresa Moore, MD Primary Care Physician: [name], MD Admit Date: 02/13/2023  Reason for Consultation/Follow-up: Establishing goals of care  HPI/Brief Hospital Review: 72 y.o. female  with past medical history of ESRD on HD TTS, asthma/COPD, severe dysphagia s/p G-tube placement, type 2 diabetes with related diabetic neuropathy status post left AKA, OSA on CPAP, cirrhosis, adrenal insufficiency, paroxysmal A-fib admitted from home on 02/13/2023 with altered mental status.  According to daughter patient became altered day prior to admission and also has concerns about yellow/greenish pus surrounding G-tube that has been present for well over a week.  Daughter also reports concern of newly placed left chest PermCath causing difficulties during last 2 HD sessions.  According to daughter Mr. Shelle Iron was scheduled to have PermCath exchanged next week.   ED found to have significant leukocytosis and metabolic acidosis, admitted to ICU for severe sepsis and septic shock requiring peripheral vasopressor support Concern for G tube versus PermCath source of infection   Noted recent admission in September for PermCath infection and MRSA bacteremia and septic shock   Palliative medicine was consulted for assisting with goals of care conversations  Subjective: Extensive chart review has been completed prior to meeting patient including labs, vital signs, imaging, progress notes, orders, and available advanced directive documents from current and previous encounters.    Visited with Alexandra Foster at her bedside she is awake, alert and shares she is feeling  well today.  Her daughter is at bedside during time of visit reports feeling as though her mom is doing better today.  She does not have any questions or concerns, goals remain clear.  No current acute palliative needs at this time.  Encouraged daughter to call PMT phone if/when needs or concerns arise.  Thank you for allowing the Palliative Medicine Team to assist in the care of this patient.  Total time:  25 minutes  Time spent includes: Detailed review of medical records (labs, imaging, vital signs), medically appropriate exam (mental status, respiratory, cardiac, skin), discussed with treatment team, counseling and educating patient, family and staff, documenting clinical information, medication management and coordination of care.  Leeanne Deed, DNP, AGNP-C Palliative Medicine  Please contact Palliative Medicine Team phone at [phone] for questions and concerns.

## 2023-02-15 NOTE — Progress Notes (Signed)
NAME:  Uyen Eichholz, MRN:  [id number], DOB:  Aug 11, 1950, LOS: 2 ADMISSION DATE:  02/13/2023,  CHIEF COMPLAINT:  Sepsis   History of Present Illness:   72 year old female with history of ESRD on HD secondary to diabetic nephropathy, also s/p left BKA, presenting to the hospital with AMS.   Patient recently had a tunneled catheter placed which was noted to be malfunctioning. She was also noted to be more altered, somnolent, and forgetful. She was sent to the ED for further workup and evaluation. She was recently admitted to the hospital in August/September of 2024 where she was noted to have an infected dialysis catheter with positive blood cultures with MRSA resulting in septic shock. Patient weaned off vasopressors, and was dialyzed through a temporary femoral dialysis catheter. With improvement, patient was discharged on 01/03/2023. Patient also has a G-Tube placed for feeding. Cuffed tunneled HD catheter placed via the left internal jugular on 02/04/2023 with vascular surgery.   She was brought in via EMS for altered mental status on 02/13/2023. Patient's mental status notable for sudden change by family where she was no longer oriented to time and was confused. She was also nauseated during dialysis. Further history from family per chart review notable for increased secretions from around the G-tube. Given recent infection, high concern for sepsis, and patient was admitted for further workup after resuscitation and IV antibiotic administration.   Hemodynamically, patient's blood pressure has been soft but she was not hypotensive on presentation. Initial lactic acid values were within normal, though on repeat overnight it did jump to 5.5. blood gas overnight notable for metabolic acidosis, thou this improved on repeat this AM. CBC showing significant leukocytosis. CT chest with atelectasis at the bases, no pneumonia on my review. Abdominal CT read with liver cirrhosis, with gas along the course of  the gastrostomy tube.   PCCM consulted given patient's hypotension and concern for severe sepsis and septic shock.  Pertinent  Medical History  -T2DM -Left BKA -history of subdural hematoma -history of necrotizing fascitis -history of tracheostomy -ESRD on HD -afib -liver cirrhosis -adrenal insufficiency  Significant Hospital Events: Including procedures, antibiotic start and stop dates in addition to other pertinent events   02/13/2023: admission, mild hypotension overnight 02/14/2023: lactic acid elevated this AM, MAP > 65 mmHg 02/15/2023: remains on vasopressors  Interim History / Subjective:  Confused, denies pain, reports back itching  Objective   Blood pressure 106/78, pulse (!) 58, temperature 98.4 F (36.9 C), temperature source Axillary, resp. rate 18, height 5\' 4"  (1.626 m), weight 85.1 kg, SpO2 97%.        Intake/Output Summary (Last 24 hours) at 02/15/2023 0857 Last data filed at 02/15/2023 0800 Gross per 24 hour  Intake 1509.13 ml  Output 100 ml  Net 1409.13 ml   Filed Weights   02/13/23 0432 02/14/23 1305  Weight: 86 kg 85.1 kg    Examination: Physical Exam Vitals reviewed.  Constitutional:      General: She is not in acute distress.    Appearance: She is obese. She is ill-appearing.  HENT:     Mouth/Throat:     Mouth: Mucous membranes are moist.  Cardiovascular:     Rate and Rhythm: Normal rate and regular rhythm.     Pulses: Normal pulses.     Heart sounds: Normal heart sounds.  Pulmonary:     Breath sounds: Normal breath sounds.  Abdominal:     General: There is distension.     Palpations:  Abdomen is soft.     Tenderness: There is no abdominal tenderness.  Neurological:     Mental Status: She is alert. She is disoriented.     Motor: Weakness present.       Assessment & Plan:   Neurology #Toxic Metabolic Encephalopathy   This is in the setting of sepsis. Patient's mental status reportedly normal at baseline. CT head without acute  abnormality, no concern for CVA at this point. Ammonia level normal at 23. Avoid psychotropic medications as able.   -advise d/c hydroxyzine   Cardiovascular #Septic Shock #Afib   Progressive hypotension secondary to septic shock requiring initiation of vasopressor support. Now on nor-epinephrine, with goal MAP > 60 mmHg. TTE from August of 2024 showed an enlarged RV, suggestive of pulmonary hypertension. PA pressures mildly elevated on repeat, but no other abnormality to explain hypotension.   -pressors for goal MAP > 60 mmHg -trend lactic acid -TTE   Pulmonary #Pulmonary Hypertension #OSA   This is likely group two secondary to heart failure with preserved ejection fraction. Otherwise no acute pulmonary issues at this point, with chest CT showing atelectasis but not pneumonia. Does have OSA, will resume CPAP nocturnally.   -resume CPAP -goal SpO2 > 92%   Gastrointestinal #Liver Cirrhosis #elevated transaminase levels   Imaging shows a cirrhotic appearing liver, but unclear cause behind liver cirrhosis. MELD score is 21, with elevation in AST and ALT and decrease in synthetic function. She's also had a G-tube placed 05/2022 with increased secretions from around it. Appreciate input from our surgical consultants - this is not concerning for G-tube infection. Patient could benefit from a GI consult to guide investigation into the etiology behind her liver cirrhosis.   Renal #ESRD on HD   History of ESRD on HD, with recently infected dialysis catheter. This was recently re-placed and appears to have low flows and is functioning poorly. Given septic shock, high concern for an infected dialysis catheter which was explanted yesterday. Appreciate input from nephrology and vascular surgery.   Endocrine #Presumed Adrenal Insufficiency   Report of adrenal insufficiency and patient has been maintained on replacement outpatient. She has not had appropriate testing to differentiate primary vs  secondary insufficiency. She's on hydrocortisone which is continued given septic shock.   -ICU glycemic protocol   Hem/Onc   Heparin subQ for prophylaxis   ID #Septic Shock #Recent MRSA bacteremia   Presents for septic shock with possible sources include infected dialysis catheter or infected G-tube insertion site. Shock likely secondary to the infected dialysis catheter. Cultures have remained negative so far, will continue with broad spectrum antibiotics. No findings on physical exam to suggest necrotizing fascitis. Continue with broad spectrum antibiotics.  -continue broad spectrum antibiotics -dialysis catheter d/c'd -await blood cultures  Best Practice (right click and "Reselect all SmartList Selections" daily)   Diet/type: tubefeeds DVT prophylaxis: prophylactic heparin  GI prophylaxis: PPI Lines: N/A Foley:  Yes, and it is still needed Code Status:  full code Last date of multidisciplinary goals of care discussion [02/15/2023]  Labs   CBC: Recent Labs  Lab 02/13/23 0047 02/13/23 0629 02/14/23 0500 02/15/23 0424  WBC 33.2* 12.6* 32.9* 31.2*  NEUTROABS 32.0* 6.7  --  29.5*  HGB 14.2 13.1 13.4 12.1  HCT 47.7* 43.5 42.9 38.4  MCV 86.9 85.8 85.0 83.3  PLT 283 297 284 295    Basic Metabolic Panel: Recent Labs  Lab 02/13/23 0047 02/13/23 0438 02/14/23 0500 02/15/23 0424  NA  --  136 139  138  K  --  4.5 3.9 4.5  CL  --  105 106 103  CO2  --  20* 16* 25  GLUCOSE  --  109* 174* 155*  BUN  --  52* 58* 72*  CREATININE  --  3.22* 3.94* 4.65*  CALCIUM  --  8.7* 8.4* 8.7*  MG 1.9  --   --   --    GFR: Estimated Creatinine Clearance: 11.2 mL/min (A) (by C-G formula based on SCr of 4.65 mg/dL (H)). Recent Labs  Lab 02/13/23 0047 02/13/23 0438 02/13/23 0629 02/14/23 0500 02/14/23 0845 02/14/23 1655 02/15/23 0424  WBC 33.2*  --  12.6* 32.9*  --   --  31.2*  LATICACIDVEN 4.8*   < > 1.3 5.5* 3.8* 4.6*  --    < > = values in this interval not displayed.     Liver Function Tests: Recent Labs  Lab 02/11/23 1455 02/13/23 0438 02/14/23 0500 02/15/23 0424  AST 144* 139* 127* 99*  ALT 140* 135* 133* 142*  ALKPHOS  --  199* 112 90  BILITOT 0.4 0.9 0.9 0.8  PROT 7.8 7.6 5.8* 6.5  ALBUMIN  --  2.7* 2.0* 2.2*   No results for input(s): "LIPASE", "AMYLASE" in the last 168 hours. Recent Labs  Lab 02/13/23 0047  AMMONIA 23    ABG    Component Value Date/Time   PHART 7.40 05/14/2021 0341   PCO2ART 45 05/14/2021 0341   PO2ART 129 (H) 05/14/2021 0341   HCO3 21.6 02/14/2023 0937   ACIDBASEDEF 4.3 (H) 02/14/2023 0937   O2SAT 87 02/14/2023 0937     Coagulation Profile: Recent Labs  Lab 02/13/23 0438  INR 1.2    Cardiac Enzymes: No results for input(s): "CKTOTAL", "CKMB", "CKMBINDEX", "TROPONINI" in the last 168 hours.  HbA1C: Hgb A1c MFr Bld  Date/Time Value Ref Range Status  02/11/2023 02:55 PM 4.7 <5.7 % of total Hgb Final    Comment:    For the purpose of screening for the presence of diabetes: . <5.7%       Consistent with the absence of diabetes 5.7-6.4%    Consistent with increased risk for diabetes             (prediabetes) > or =6.5%  Consistent with diabetes . This assay result is consistent with a decreased risk of diabetes. . Currently, no consensus exists regarding use of hemoglobin A1c for diagnosis of diabetes in children. . According to American Diabetes Association (ADA) guidelines, hemoglobin A1c <7.0% represents optimal control in non-pregnant diabetic patients. Different metrics may apply to specific patient populations.  Standards of Medical Care in Diabetes(ADA). [name]   12/07/2022 02:13 AM 5.2 4.8 - 5.6 % Final    Comment:    (NOTE) Pre diabetes:          5.7%-6.4%  Diabetes:              >6.4%  Glycemic control for   <7.0% adults with diabetes     CBG: Recent Labs  Lab 02/14/23 1310 02/14/23 1529 02/14/23 2008 02/14/23 2327 02/15/23 0421  GLUCAP 190* 159* 140* 154* 124*     Past Medical History:  She,  has a past medical history of AKI (acute kidney injury) (HCC), Arthritis, Asthma, Bacteremia, COPD (chronic obstructive pulmonary disease) (HCC), Diabetes mellitus without complication (HCC), Endometriosis, GERD (gastroesophageal reflux disease), History of echocardiogram, Hypertension, Obesity, PAF (paroxysmal atrial fibrillation) (HCC), and Sleep apnea.   Surgical History:   Past Surgical History:  Procedure Laterality Date   ABDOMINAL HYSTERECTOMY     APPLICATION OF WOUND VAC Left 05/17/2021   Procedure: APPLICATION OF WOUND VAC/WOUND VAC EXCHANGE-Matrix Myriad;  Surgeon: Carolan Shiver, MD;  Location: [facility];  Service: General;  Laterality: Left;   APPLICATION OF WOUND VAC  05/24/2021   Procedure: APPLICATION OF WOUND VAC;  Surgeon: Carolan Shiver, MD;  Location: [facility];  Service: General;;   APPLICATION OF WOUND VAC  05/10/2021   Procedure: APPLICATION OF WOUND VAC;  Surgeon: Carolan Shiver, MD;  Location: [facility];  Service: General;;   COLONOSCOPY  10/29/2006   Dr Servando Snare   COLONOSCOPY WITH PROPOFOL N/A 11/05/2016   Procedure: COLONOSCOPY WITH PROPOFOL;  Surgeon: Earline Mayotte, MD;  Location: [facility];  Service: Endoscopy;  Laterality: N/A;   DIALYSIS/PERMA CATHETER INSERTION N/A 12/19/2022   Procedure: DIALYSIS/PERMA CATHETER INSERTION;  Surgeon: Annice Needy, MD;  Location: [facility];  Service: Cardiovascular;  Laterality: N/A;   DIALYSIS/PERMA CATHETER INSERTION N/A 02/04/2023   Procedure: DIALYSIS/PERMA CATHETER INSERTION;  Surgeon: Annice Needy, MD;  Location: [facility];  Service: Cardiovascular;  Laterality: N/A;   INCISION AND DRAINAGE OF WOUND Left 05/24/2021   Procedure: IRRIGATION AND DEBRIDEMENT LEFT LEG;  Surgeon: Carolan Shiver, MD;  Location: [facility];  Service: General;  Laterality: Left;   INCISION AND DRAINAGE OF WOUND Left 05/10/2021   Procedure: IRRIGATION AND DEBRIDEMENT LEFT  LEG;  Surgeon: Carolan Shiver, MD;  Location: [facility];  Service: General;  Laterality: Left;   IR FLUORO GUIDE CV LINE RIGHT  06/12/2021   IR PERC TUN PERIT CATH WO PORT S&I /IMAG  06/12/2021   IR REPLC GASTRO/COLONIC TUBE PERCUT W/FLUORO  06/12/2021   IVC FILTER INSERTION N/A 05/29/2021   Procedure: IVC FILTER INSERTION;  Surgeon: Renford Dills, MD;  Location: [facility];  Service: Cardiovascular;  Laterality: N/A;   MINOR GRAFT APPLICATION  05/24/2021   Procedure: Myriad Matrix  APPLICATION;  Surgeon: Carolan Shiver, MD;  Location: [facility];  Service: General;;   NASAL SINUS SURGERY  2002   Dr Chestine Spore   PEG PLACEMENT N/A 05/28/2021   Procedure: PERCUTANEOUS ENDOSCOPIC GASTROSTOMY (PEG) PLACEMENT;  Surgeon: Sung Amabile, DO;  Location: [facility];  Service: General;  Laterality: N/A;  TRAVEL CASE   TRACHEOSTOMY TUBE PLACEMENT N/A 05/17/2021   Procedure: TRACHEOSTOMY;  Surgeon: Geanie Logan, MD;  Location: [facility];  Service: ENT;  Laterality: N/A;   WOUND DEBRIDEMENT Left 05/07/2021   Procedure: DEBRIDEMENT WOUND;  Surgeon: Carolan Shiver, MD;  Location: [facility];  Service: General;  Laterality: Left;     Social History:   reports that she quit smoking about 21 years ago. Her smoking use included cigarettes. She started smoking about 61 years ago. She has a 80 pack-year smoking history. She quit smokeless tobacco use about 21 years ago.  Her smokeless tobacco use included snuff. She reports that she does not drink alcohol and does not use drugs.   Family History:  Her family history includes Alcohol abuse in her brother; Breast cancer (age of onset: 64) in her sister; Congestive Heart Failure in her mother; Coronary artery disease (age of onset: 52) in her father; Heart disease in her brother; Varicose Veins in her brother.   Allergies No Known Allergies   Home Medications  Prior to Admission medications   Medication Sig Start Date End Date Taking?  Authorizing Provider  acetaminophen (TYLENOL) 325 MG tablet Place 2 tablets (650 mg total) into  feeding tube every 6 (six) hours as needed for mild pain (or Fever >/= 101). 12/29/22  Yes Wieting, Richard, MD  amiodarone (PACERONE) 200 MG tablet Place 1 tablet (200 mg total) into feeding tube 2 (two) times daily. 02/12/23  Yes Sowles, [name], MD  ascorbic acid (VITAMIN C) 500 MG tablet Place 1 tablet (500 mg total) into feeding tube 2 (two) times daily. 06/13/21  Yes Erin Fulling, MD  atorvastatin (LIPITOR) 20 MG tablet Place 1 tablet (20 mg total) into feeding tube daily. 02/12/23  Yes Sowles, [name], MD  cholestyramine Lanetta Inch) 4 g packet Place 1 packet (4 g total) into feeding tube 3 (three) times daily. 02/11/23  Yes Sowles, [name], MD  clonazePAM (KLONOPIN) 0.5 MG tablet Take 0.5-1 tablets (0.25-0.5 mg total) by mouth daily as needed for anxiety. 02/11/23  Yes Sowles, [name], MD  DAPTOmycin 500 mg in sodium chloride 0.9 % 50 mL Inject 500 mg into the vein Every Tuesday,Thursday,and Saturday with dialysis. Stop after 01/03/23 dose (given with dialysis) 12/30/22  Yes Wieting, Richard, MD  diphenoxylate-atropine (LOMOTIL) 2.5-0.025 MG tablet 1 tablet by Per J Tube route 4 (four) times daily as needed for diarrhea or loose stools. 12/29/22  Yes Wieting, Richard, MD  escitalopram (LEXAPRO) 10 MG tablet Place 1 tablet (10 mg total) into feeding tube daily. 02/11/23  Yes Sowles, [name], MD  famotidine (PEPCID) 10 MG tablet Place 1 tablet (10 mg total) into feeding tube daily. 02/11/23  Yes Sowles, [name], MD  fludrocortisone (FLORINEF) 0.1 MG tablet Place 2 tablets (0.2 mg total) into feeding tube daily. 12/30/22  Yes Wieting, Richard, MD  hydrOXYzine (ATARAX) 50 MG tablet Take 1 tablet (50 mg total) by mouth 3 (three) times daily as needed. Patient taking differently: Take 100 mg by mouth 2 (two) times daily. 02/11/23  Yes Sowles, [name], MD  metoCLOPramide (REGLAN) 5 MG tablet Take 1 tablet (5 mg  total) by mouth 3 (three) times daily before meals. 02/11/23  Yes Sowles, [name], MD  midodrine (PROAMATINE) 10 MG tablet Place 2 tablets (20 mg total) into feeding tube 3 (three) times daily with meals. 02/12/23  Yes Sowles, [name], MD  multivitamin (RENA-VIT) TABS tablet Place 1 tablet into feeding tube at bedtime. 02/04/23  Yes Sowles, [name], MD  nystatin (MYCOSTATIN/NYSTOP) powder Apply topically 3 (three) times daily. 12/29/22  Yes Wieting, Richard, MD  polyvinyl alcohol (LIQUIFILM TEARS) 1.4 % ophthalmic solution Place 1 drop into both eyes as needed for dry eyes. 12/29/22  Yes Alford Highland, MD  traZODone (DESYREL) 50 MG tablet Take 0.5 tablets (25 mg total) by mouth at bedtime as needed for sleep. 02/11/23  Yes Sowles, [name], MD  vitamin D3 (CHOLECALCIFEROL) 25 MCG tablet Place 2 tablets (2,000 Units total) into feeding tube daily. 01/02/23  Yes Enedina Finner, MD  Nutritional Supplements (FEEDING SUPPLEMENT, NEPRO CARB STEADY,) LIQD Place 1,120 mLs into feeding tube daily. 12/29/22   Alford Highland, MD  Nutritional Supplements (FEEDING SUPPLEMENT, NEPRO CARB STEADY,) LIQD Place 1,120 mLs into feeding tube daily. 01/02/23   Enedina Finner, MD  Water For Irrigation, Sterile (FREE WATER) SOLN Place 50 mLs into feeding tube every 4 (four) hours. 12/29/22   Alford Highland, MD     Critical care time: 43 minutes    Raechel Chute, MD  Pulmonary Critical Care 02/15/2023 7:26 PM

## 2023-02-15 NOTE — Progress Notes (Signed)
Spoke with Dr. Georgeann Oppenheim and discussed patient's heart rate in 50's and asked about giving the first amio dose at 1815 when allowed since questran was given at 1415 and asked about 2200 dose of amio. MD gave order to discontinue amio order and he will address it tomorrow.

## 2023-02-16 ENCOUNTER — Encounter (INDEPENDENT_AMBULATORY_CARE_PROVIDER_SITE_OTHER): Payer: Medicare Other

## 2023-02-16 ENCOUNTER — Ambulatory Visit: Admit: 2023-02-16 | Payer: Medicare Other | Admitting: Vascular Surgery

## 2023-02-16 ENCOUNTER — Encounter
Admission: EM | Disposition: A | Discharge status: Home Health | Payer: Self-pay | Source: Home / Self Care | Attending: Internal Medicine

## 2023-02-16 DIAGNOSIS — Z992 Dependence on renal dialysis: Secondary | ICD-10-CM | POA: Diagnosis not present

## 2023-02-16 DIAGNOSIS — A419 Sepsis, unspecified organism: Secondary | ICD-10-CM | POA: Diagnosis not present

## 2023-02-16 DIAGNOSIS — G9341 Metabolic encephalopathy: Secondary | ICD-10-CM | POA: Diagnosis not present

## 2023-02-16 DIAGNOSIS — R6520 Severe sepsis without septic shock: Secondary | ICD-10-CM | POA: Diagnosis not present

## 2023-02-16 DIAGNOSIS — N186 End stage renal disease: Secondary | ICD-10-CM | POA: Diagnosis not present

## 2023-02-16 HISTORY — PX: DIALYSIS/PERMA CATHETER INSERTION: CATH118288

## 2023-02-16 LAB — COMPREHENSIVE METABOLIC PANEL
ALT: 164 U/L — ABNORMAL HIGH (ref 0–44)
AST: 144 U/L — ABNORMAL HIGH (ref 15–41)
Albumin: 2.2 g/dL — ABNORMAL LOW (ref 3.5–5.0)
Alkaline Phosphatase: 116 U/L (ref 38–126)
Anion gap: 13 (ref 5–15)
BUN: 78 mg/dL — ABNORMAL HIGH (ref 8–23)
CO2: 20 mmol/L — ABNORMAL LOW (ref 22–32)
Calcium: 8.8 mg/dL — ABNORMAL LOW (ref 8.9–10.3)
Chloride: 103 mmol/L (ref 98–111)
Creatinine, Ser: 4.7 mg/dL — ABNORMAL HIGH (ref 0.44–1.00)
GFR, Estimated: 9 mL/min — ABNORMAL LOW (ref >60)
Glucose, Bld: 114 mg/dL — ABNORMAL HIGH (ref 70–99)
Potassium: 4.2 mmol/L (ref 3.5–5.1)
Sodium: 136 mmol/L (ref 135–145)
Total Bilirubin: 0.8 mg/dL (ref <1.2)
Total Protein: 6.4 g/dL — ABNORMAL LOW (ref 6.5–8.1)

## 2023-02-16 LAB — GLUCOSE, CAPILLARY
Glucose-Capillary: 127 mg/dL — ABNORMAL HIGH (ref 70–99)
Glucose-Capillary: 120 mg/dL — ABNORMAL HIGH (ref 70–99)
Glucose-Capillary: 120 mg/dL — ABNORMAL HIGH (ref 70–99)
Glucose-Capillary: 123 mg/dL — ABNORMAL HIGH (ref 70–99)
Glucose-Capillary: 107 mg/dL — ABNORMAL HIGH (ref 70–99)
Glucose-Capillary: 117 mg/dL — ABNORMAL HIGH (ref 70–99)
Glucose-Capillary: 103 mg/dL — ABNORMAL HIGH (ref 70–99)

## 2023-02-16 LAB — CBC WITH DIFFERENTIAL/PLATELET
Abs Immature Granulocytes: 0.22 10*3/uL — ABNORMAL HIGH (ref 0.00–0.07)
Basophils Absolute: 0 10*3/uL (ref 0.0–0.1)
Basophils Relative: 0 %
Eosinophils Absolute: 0 10*3/uL (ref 0.0–0.5)
Eosinophils Relative: 0 %
HCT: 34.4 % — ABNORMAL LOW (ref 36.0–46.0)
Hemoglobin: 11.3 g/dL — ABNORMAL LOW (ref 12.0–15.0)
Immature Granulocytes: 1 %
Lymphocytes Relative: 2 %
Lymphs Abs: 0.6 10*3/uL — ABNORMAL LOW (ref 0.7–4.0)
MCH: 25.9 pg — ABNORMAL LOW (ref 26.0–34.0)
MCHC: 32.8 g/dL (ref 30.0–36.0)
MCV: 78.7 fL — ABNORMAL LOW (ref 80.0–100.0)
Monocytes Absolute: 0.4 10*3/uL (ref 0.1–1.0)
Monocytes Relative: 2 %
Neutro Abs: 25 10*3/uL — ABNORMAL HIGH (ref 1.7–7.7)
Neutrophils Relative %: 95 %
Platelets: 351 10*3/uL (ref 150–400)
RBC: 4.37 MIL/uL (ref 3.87–5.11)
RDW: 14.6 % (ref 11.5–15.5)
Smear Review: NORMAL
WBC: 26.3 10*3/uL — ABNORMAL HIGH (ref 4.0–10.5)
nRBC: 0.1 % (ref 0.0–0.2)

## 2023-02-16 LAB — PHOSPHORUS
Phosphorus: 5.2 mg/dL — ABNORMAL HIGH (ref 2.5–4.6)
Phosphorus: 5 mg/dL — ABNORMAL HIGH (ref 2.5–4.6)

## 2023-02-16 LAB — MAGNESIUM: Magnesium: 2.2 mg/dL (ref 1.7–2.4)

## 2023-02-16 SURGERY — DIALYSIS/PERMA CATHETER INSERTION
Anesthesia: Moderate Sedation

## 2023-02-16 MED ORDER — MIDAZOLAM HCL 2 MG/2ML IJ SOLN
INTRAMUSCULAR | Status: DC | PRN
  Administered 2023-02-16: 1 mg via INTRAVENOUS
  Administered 2023-02-16: .5 mg via INTRAVENOUS

## 2023-02-16 MED ORDER — FENTANYL CITRATE PF 50 MCG/ML IJ SOSY: 12.5000 ug | PREFILLED_SYRINGE | Freq: Once | INTRAMUSCULAR | Status: DC | PRN

## 2023-02-16 MED ORDER — SODIUM CHLORIDE 0.9 % IV SOLN: INTRAVENOUS | Status: DC

## 2023-02-16 MED ORDER — VANCOMYCIN VARIABLE DOSE PER UNSTABLE RENAL FUNCTION (PHARMACIST DOSING): Status: DC

## 2023-02-16 MED ORDER — HEPARIN (PORCINE) IN NACL 1000-0.9 UT/500ML-% IV SOLN
INTRAVENOUS | Status: DC | PRN
  Administered 2023-02-16: 500 mL

## 2023-02-16 MED ORDER — FAMOTIDINE 20 MG PO TABS: 40.0000 mg | ORAL_TABLET | Freq: Once | ORAL | Status: DC | PRN

## 2023-02-16 MED ORDER — MIDAZOLAM HCL 2 MG/ML PO SYRP: 8.0000 mg | ORAL_SOLUTION | Freq: Once | ORAL | Status: DC | PRN

## 2023-02-16 MED ORDER — DIPHENHYDRAMINE HCL 50 MG/ML IJ SOLN: 50.0000 mg | Freq: Once | INTRAMUSCULAR | Status: DC | PRN

## 2023-02-16 MED ORDER — FENTANYL CITRATE (PF) 100 MCG/2ML IJ SOLN
INTRAMUSCULAR | Status: DC | PRN
  Administered 2023-02-16: 50 ug via INTRAVENOUS
  Administered 2023-02-16: 25 ug via INTRAVENOUS

## 2023-02-16 MED ORDER — LIDOCAINE-EPINEPHRINE (PF) 1 %-1:200000 IJ SOLN
INTRAMUSCULAR | Status: DC | PRN
  Administered 2023-02-16: 20 mL

## 2023-02-16 MED ORDER — PHENOL 1.4 % MT LIQD
1.0000 | OROMUCOSAL | Status: DC | PRN
  Administered 2023-02-16: 1 via OROMUCOSAL
  Filled 2023-02-16: qty 177

## 2023-02-16 MED ORDER — METHYLPREDNISOLONE SODIUM SUCC 125 MG IJ SOLR: 125.0000 mg | Freq: Once | INTRAMUSCULAR | Status: DC | PRN

## 2023-02-16 MED ORDER — IPRATROPIUM-ALBUTEROL 0.5-2.5 (3) MG/3ML IN SOLN
3.0000 mL | Freq: Two times a day (BID) | RESPIRATORY_TRACT | Status: DC
  Administered 2023-02-16 – 2023-02-19 (×6): 3 mL via RESPIRATORY_TRACT
  Filled 2023-02-16 (×6): qty 3

## 2023-02-16 MED ORDER — FENTANYL CITRATE (PF) 100 MCG/2ML IJ SOLN
INTRAMUSCULAR | Status: AC
  Filled 2023-02-16: qty 2

## 2023-02-16 MED ORDER — MIDAZOLAM HCL 2 MG/2ML IJ SOLN
INTRAMUSCULAR | Status: AC
  Filled 2023-02-16: qty 2

## 2023-02-16 SURGICAL SUPPLY — 7 items
ADH SKN CLS APL DERMABOND .7 (GAUZE/BANDAGES/DRESSINGS) ×1
BIOPATCH RED 1 DISK 7.0 (GAUZE/BANDAGES/DRESSINGS) IMPLANT
CATH PALIN MAXID VT KIT 19CM (CATHETERS) IMPLANT
COVER PROBE ULTRASOUND 5X96 (MISCELLANEOUS) IMPLANT
DERMABOND ADVANCED .7 DNX12 (GAUZE/BANDAGES/DRESSINGS) IMPLANT
SUT MNCRL AB 4-0 PS2 18 (SUTURE) IMPLANT
SUT PROLENE 0 CT 1 30 (SUTURE) IMPLANT

## 2023-02-16 NOTE — Plan of Care (Signed)
  Problem: Clinical Measurements: Goal: Will remain free from infection Outcome: Progressing Goal: Respiratory complications will improve Outcome: Progressing   Problem: Activity: Goal: Risk for activity intolerance will decrease Outcome: Progressing   Problem: Nutrition: Goal: Adequate nutrition will be maintained Outcome: Progressing   Problem: Coping: Goal: Level of anxiety will decrease Outcome: Progressing   Problem: Elimination: Goal: Will not experience complications related to bowel motility Outcome: Progressing   Problem: Pain Management: Goal: General experience of comfort will improve Outcome: Progressing   Problem: Safety: Goal: Ability to remain free from injury will improve Outcome: Progressing

## 2023-02-16 NOTE — Progress Notes (Signed)
PROGRESS NOTE    Alexandra Foster  ZOX:[id number] DOB: [birth year] DOA: 02/13/2023 PCP: [name], MD    Brief Narrative:   72 y.o. female with medical history significant of ESRD on HD TTS, via a left chest PermCath, asthma/COPD, severe dysphagia status post G-tube, IIDM, HTN, left AKA, PAF, OSA on CPAP, cirrhosis, adrenal insufficiency on Cortef, brought in by family member for evaluation of sepsis.   Patient is confused, or history provided by daughter over the phone and patient's sister at bedside.  Patient started to have altered mentations yesterday and threw up x 1 during dialysis.  Overnight, patient became more confused.  According to daughter, patient's G-tube insertion site has a possible infected as she has been seeing more yellow-greenish pus coming out from around the G-tube entrance and she had to change the gauze of the G-tube 2 times a day to clean the past.  However patient does have a visiting wound care nurse seeing her 2 weeks sick day and according to the wound care team, the G-tube site was not infected.  In addition, daughter also reported that for the last 2 HD sessions, the outpatient HD team reported that the PermCath on left chest " has some narrowing and blood flow very slow" and patient was scheduled to have PermCath exchanged next week.  Patient denied any fever chills no diarrhea no abdominal pain or chest pain.   Assessment & Plan:   Principal Problem:   Sepsis (HCC) Active Problems:   Aspiration pneumonia (HCC)   Septic shock (HCC)   Gastrostomy tube in place (HCC)   Pressure injury of skin  Septic shock Patient has had low blood pressure despite multiple fluid boluses.  Sepsis criteria met with leukocytosis, tachycardia, fever.  Potential sources include chest dialysis catheter versus gastric tube.  Higher suspicion that HD catheter site may be infected. Patient's maps remained low despite treatment with oral midodrine and IV stress dose  steroids Plan:  Patient remains in ICU.  Peripheral Levophed slowly weaning down.  Currently at 1 mcg/h.  Continue to wean.  Continue midodrine 20 mg 3 times daily via tube.  Continue hydrocortisone 100 mg IV every 8 hours.  MAP goal 60.  Continue vancomycin and Zosyn.  Can stop Diflucan.  PEG tube was evaluated for septic focus by general surgery.  Do not feel that PEG tube is infected.  Tube feeds reinitiated.  Patient was given a line holiday for 48 hours after removal of HD PermCath.  Vascular surgery consulted and will replace catheter today.  Nephrology following for inpatient HD needs.  Continue with daily labs.  Acute metabolic encephalopathy Likely secondary to sepsis Mentating reasonably clearly on my evaluation   Acute transaminitis Downtrending, likely related to sepsis Daily LFTs   ESRD on HD with access difficulties Case discussed with on-call nephrology.  Patient was last dialyzed Thursday 10/31.  HD catheter was removed by vascular surgery Saturday 11/2.  Vascular surgery reengaged and will replace vascular access 11/4.  PAF Patient not on anticoagulation for unclear reasons.   Amiodarone on hold given resting bradycardia Continue to monitor on telemetry. Anticipate restarting amiodarone within 24 hours  G-tube Severe dysphagia General Surgery consult requested and appreciated.   Patient will remain n.p.o. as she has severe dysphagia.  All medications via tube.   Tube feeds restarted 11/3  Chronic adrenal insufficiency Midodrine 20 mg 3 times daily.   Stress dose steroids 100 mg IV every 8 hours  OSA CPAP nightly   DVT  prophylaxis: SQ heparin Code Status: Full Family Communication:Daughter [name] [phone] on 11/2 Disposition Plan: Status is: Inpatient Remains inpatient appropriate because: Multiple acute issues as above   Level of care: ICU  Consultants:  PCCM Nephrology Vascular surgery Palliative care General Surgery  Procedures:   None  Antimicrobials: Vancomycin Zosyn   Subjective: Seen and examined.  Shock physiology slowly resolving  Objective: Vitals:   02/16/23 1200 02/16/23 1300 02/16/23 1359 02/16/23 1400  BP: 110/62 118/63 117/60 126/62  Pulse: (!) 57 (!) 57 (!) 59 (!) 58  Resp: 15 17 (!) 22 (!) 24  Temp: 98 F (36.7 C)  98 F (36.7 C)   TempSrc: Axillary  Axillary   SpO2: 94% 93% 95% 92%  Weight:      Height:        Intake/Output Summary (Last 24 hours) at 02/16/2023 1448 Last data filed at 02/16/2023 1400 Gross per 24 hour  Intake 1080.08 ml  Output 145 ml  Net 935.08 ml   Filed Weights   02/13/23 0432 02/14/23 1305 02/16/23 0426  Weight: 86 kg 85.1 kg 85.4 kg    Examination:  General exam: NAD.  Chronically ill-appearing Respiratory system: Lungs clear overall.  Normal work of breathing.  2 L Cardiovascular system: S1-S2, bradycardic, no murmurs, no pedal edema Gastrointestinal system: Soft, NT/ND, normal bowel sounds, + G-tube Central nervous system: Oriented x 2.  No focal deficits Extremities: Decreased power bilateral lower extremities.  Gait not assessed Skin: No rashes, lesions or ulcers Psychiatry: Judgement and insight appear impaired. Mood & affect flattened.     Data Reviewed: I have personally reviewed following labs and imaging studies  CBC: Recent Labs  Lab 02/13/23 0047 02/13/23 0629 02/14/23 0500 02/15/23 0424 02/16/23 0414  WBC 33.2* 12.6* 32.9* 31.2* 26.3*  NEUTROABS 32.0* 6.7  --  29.5* 25.0*  HGB 14.2 13.1 13.4 12.1 11.3*  HCT 47.7* 43.5 42.9 38.4 34.4*  MCV 86.9 85.8 85.0 83.3 78.7*  PLT 283 297 284 295 351   Basic Metabolic Panel: Recent Labs  Lab 02/13/23 0047 02/13/23 0438 02/14/23 0500 02/15/23 0424 02/15/23 1344 02/15/23 1651 02/16/23 0414  NA  --  136 139 138  --   --  136  K  --  4.5 3.9 4.5  --   --  4.2  CL  --  105 106 103  --   --  103  CO2  --  20* 16* 25  --   --  20*  GLUCOSE  --  109* 174* 155*  --   --  114*  BUN   --  52* 58* 72*  --   --  78*  CREATININE  --  3.22* 3.94* 4.65*  --   --  4.70*  CALCIUM  --  8.7* 8.4* 8.7*  --   --  8.8*  MG 1.9  --   --   --  2.1 2.0 2.2  PHOS  --   --   --   --  4.6 5.2* 5.2*   GFR: Estimated Creatinine Clearance: 11.1 mL/min (A) (by C-G formula based on SCr of 4.7 mg/dL (H)). Liver Function Tests: Recent Labs  Lab 02/11/23 1455 02/13/23 0438 02/14/23 0500 02/15/23 0424 02/16/23 0414  AST 144* 139* 127* 99* 144*  ALT 140* 135* 133* 142* 164*  ALKPHOS  --  199* 112 90 116  BILITOT 0.4 0.9 0.9 0.8 0.8  PROT 7.8 7.6 5.8* 6.5 6.4*  ALBUMIN  --  2.7* 2.0* 2.2* 2.2*  No results for input(s): "LIPASE", "AMYLASE" in the last 168 hours. Recent Labs  Lab 02/13/23 0047  AMMONIA 23   Coagulation Profile: Recent Labs  Lab 02/13/23 0438  INR 1.2   Cardiac Enzymes: No results for input(s): "CKTOTAL", "CKMB", "CKMBINDEX", "TROPONINI" in the last 168 hours. BNP (last 3 results) No results for input(s): "PROBNP" in the last 8760 hours. HbA1C: No results for input(s): "HGBA1C" in the last 72 hours.  CBG: Recent Labs  Lab 02/15/23 1935 02/15/23 2343 02/16/23 0719 02/16/23 1124 02/16/23 1400  GLUCAP 95 111* 120* 123* 107*   Lipid Profile: No results for input(s): "CHOL", "HDL", "LDLCALC", "TRIG", "CHOLHDL", "LDLDIRECT" in the last 72 hours.  Thyroid Function Tests: No results for input(s): "TSH", "T4TOTAL", "FREET4", "T3FREE", "THYROIDAB" in the last 72 hours.  Anemia Panel: No results for input(s): "VITAMINB12", "FOLATE", "FERRITIN", "TIBC", "IRON", "RETICCTPCT" in the last 72 hours. Sepsis Labs: Recent Labs  Lab 02/14/23 0500 02/14/23 0845 02/14/23 1655 02/15/23 1937  LATICACIDVEN 5.5* 3.8* 4.6* 1.5    Recent Results (from the past 240 hour(s))  Culture, blood (Routine x 2)     Status: None (Preliminary result)   Collection Time: 02/13/23  4:39 AM   Specimen: BLOOD  Result Value Ref Range Status   Specimen Description BLOOD BLOOD LEFT  ARM  Final   Special Requests   Final    BOTTLES DRAWN AEROBIC AND ANAEROBIC Blood Culture adequate volume   Culture   Final    NO GROWTH 3 DAYS Performed at [facility], [address], [place], [place] [address]    Report Status PENDING  Incomplete  Culture, blood (Routine x 2)     Status: None (Preliminary result)   Collection Time: 02/13/23  4:39 AM   Specimen: BLOOD  Result Value Ref Range Status   Specimen Description BLOOD BLOOD RIGHT ARM  Final   Special Requests   Final    BOTTLES DRAWN AEROBIC AND ANAEROBIC Blood Culture adequate volume   Culture   Final    NO GROWTH 3 DAYS Performed at [facility], [address], [place], [place] [address]    Report Status PENDING  Incomplete  Urine Culture     Status: Abnormal   Collection Time: 02/13/23  6:29 AM   Specimen: Urine, Random  Result Value Ref Range Status   Specimen Description   Final    URINE, RANDOM Performed at [facility], [address] [place], [place] [address]    Special Requests   Final    NONE Reflexed from [id number] Performed at [facility], [address] [place], [place] [address]    Culture MULTIPLE SPECIES PRESENT, SUGGEST RECOLLECTION (A)  Final   Report Status 02/14/2023 FINAL  Final  Resp panel by RT-PCR (RSV, Flu A&B, Covid) Anterior Nasal Swab     Status: None   Collection Time: 02/13/23  1:30 PM   Specimen: Anterior Nasal Swab  Result Value Ref Range Status   SARS Coronavirus 2 by RT PCR NEGATIVE NEGATIVE Final    Comment: (NOTE) SARS-CoV-2 target nucleic acids are NOT DETECTED.  The SARS-CoV-2 RNA is generally detectable in upper respiratory specimens during the acute phase of infection. The lowest concentration of SARS-CoV-2 viral copies this assay can detect is 138 copies/mL. A negative result does not preclude SARS-Cov-2 infection and should not be used as the sole basis for treatment or other patient management decisions. A negative result  may occur with  improper specimen collection/handling, submission of  specimen other than nasopharyngeal swab, presence of viral mutation(s) within the areas targeted by this assay, and inadequate number of viral copies(<138 copies/mL). A negative result must be combined with clinical observations, patient history, and epidemiological information. The expected result is Negative.  Fact Sheet for Patients:  BloggerCourse.com  Fact Sheet for Healthcare Providers:  SeriousBroker.it  This test is no t yet approved or cleared by the [place] FDA and  has been authorized for detection and/or diagnosis of SARS-CoV-2 by FDA under an Emergency Use Authorization (EUA). This EUA will remain  in effect (meaning this test can be used) for the duration of the COVID-19 declaration under Section 564(b)(1) of the Act, 21 U.S.C.section 360bbb-3(b)(1), unless the authorization is terminated  or revoked sooner.       Influenza A by PCR NEGATIVE NEGATIVE Final   Influenza B by PCR NEGATIVE NEGATIVE Final    Comment: (NOTE) The Xpert Xpress SARS-CoV-2/FLU/RSV plus assay is intended as an aid in the diagnosis of influenza from Nasopharyngeal swab specimens and should not be used as a sole basis for treatment. Nasal washings and aspirates are unacceptable for Xpert Xpress SARS-CoV-2/FLU/RSV testing.  Fact Sheet for Patients: BloggerCourse.com  Fact Sheet for Healthcare Providers: SeriousBroker.it  This test is not yet approved or cleared by the [place] FDA and has been authorized for detection and/or diagnosis of SARS-CoV-2 by FDA under an Emergency Use Authorization (EUA). This EUA will remain in effect (meaning this test can be used) for the duration of the COVID-19 declaration under Section 564(b)(1) of the Act, 21 U.S.C. section 360bbb-3(b)(1), unless the authorization is terminated  or revoked.     Resp Syncytial Virus by PCR NEGATIVE NEGATIVE Final    Comment: (NOTE) Fact Sheet for Patients: BloggerCourse.com  Fact Sheet for Healthcare Providers: SeriousBroker.it  This test is not yet approved or cleared by the [place] FDA and has been authorized for detection and/or diagnosis of SARS-CoV-2 by FDA under an Emergency Use Authorization (EUA). This EUA will remain in effect (meaning this test can be used) for the duration of the COVID-19 declaration under Section 564(b)(1) of the Act, 21 U.S.C. section 360bbb-3(b)(1), unless the authorization is terminated or revoked.  Performed at [facility], [address] [place], [place] [address]   MRSA Next Gen by PCR, Nasal     Status: Abnormal   Collection Time: 02/13/23  9:05 PM   Specimen: Nasal Mucosa; Nasal Swab  Result Value Ref Range Status   MRSA by PCR Next Gen DETECTED (A) NOT DETECTED Final    Comment: CRITICAL RESULT CALLED TO, READ BACK BY AND VERIFIED WITH: Mosetta Putt RN @ 2300 02/13/23 [facility] (NOTE) The GeneXpert MRSA Assay (FDA approved for NASAL specimens only), is one component of a comprehensive MRSA colonization surveillance program. It is not intended to diagnose MRSA infection nor to guide or monitor treatment for MRSA infections. Test performance is not FDA approved in patients less than 36 years old. Performed at [facility], [address] [place], [place] [address]   MRSA Next Gen by PCR, Nasal     Status: Abnormal   Collection Time: 02/14/23  1:15 PM   Specimen: Nasal Mucosa; Nasal Swab  Result Value Ref Range Status   MRSA by PCR Next Gen DETECTED (A) NOT DETECTED Final    Comment: RESULT CALLED TO, READ BACK BY AND VERIFIED WITH: BRITTNEY MANSFIELD AT 1614 02/14/23.PM[facility] (NOTE) The GeneXpert MRSA Assay (FDA approved for NASAL specimens only), is one component  of a comprehensive MRSA colonization  surveillance program. It is not intended to diagnose MRSA infection nor to guide or monitor treatment for MRSA infections. Test performance is not FDA approved in patients less than 37 years old. Performed at [facility], [address] [place], [place] [address]   Aerobic Culture w Gram Stain (superficial specimen)     Status: None (Preliminary result)   Collection Time: 02/14/23  4:55 PM   Specimen: Catheter Tip  Result Value Ref Range Status   Specimen Description   Final    CATH TIP Performed at [facility], [address] [place], [place] [address]    Special Requests   Final    NONE Performed at [facility], [address] [place], [place] [address]    Gram Stain   Final    RARE WBC PRESENT, PREDOMINANTLY PMN NO ORGANISMS SEEN    Culture   Final    NO GROWTH 1 DAY Performed at [facility], [address] [place], [place] [address]    Report Status PENDING  Incomplete  Anaerobic culture w Gram Stain     Status: None (Preliminary result)   Collection Time: 02/14/23  4:55 PM   Specimen: Catheter Tip  Result Value Ref Range Status   Specimen Description   Final    CATH TIP Performed at [facility], [address], [place], [place] [address]    Special Requests   Final    NONE Performed at [facility], [address] [place], [place] [address]    Gram Stain   Final    RARE WBC PRESENT, PREDOMINANTLY PMN NO ORGANISMS SEEN Performed at [facility], [address], [place], [place] [address]    Culture PENDING  Incomplete   Report Status PENDING  Incomplete         Radiology Studies: ECHOCARDIOGRAM COMPLETE  Result Date: 02/15/2023    ECHOCARDIOGRAM REPORT   Patient Name:   Ranada Vigorito Tryon Date of Exam: 02/15/2023 Medical Rec #:  [id number]            Height:       64.0 in Accession #:    [id number]          Weight:       187.6 lb Date of Birth:  1950/09/13           BSA:          1.904 m  Patient Age:    72 years            BP:           93/33 mmHg Patient Gender: F                   HR:           60 bpm. Exam Location:  [facility] Procedure: 2D Echo, Cardiac Doppler and Color Doppler Indications:     Dyspnea R06.00  History:         Patient has prior history of Echocardiogram examinations, most                  recent 12/07/2022. COPD and ESRD; Risk Factors:Former Smoker and                  Sleep Apnea.  Sonographer:     Dondra Prader RVT RCS Referring Phys:  Raechel Chute Diagnosing Phys: Jodelle Red MD  Sonographer Comments: Dyspnea R06.00 IMPRESSIONS  1. Left ventricular ejection fraction, by estimation, is 60 to 65%. The left ventricle has normal function. The left ventricle has no regional wall motion abnormalities. Left ventricular diastolic parameters are indeterminate.  2. Right ventricular systolic function is normal. The right ventricular size is normal. There is mildly elevated pulmonary artery systolic pressure. The estimated right ventricular systolic pressure is 37.8 mmHg.  3. Left atrial size was mildly dilated.  4. The mitral valve is normal in structure. Trivial mitral valve regurgitation. No evidence of mitral stenosis. Moderate to severe mitral annular calcification.  5. The aortic valve is tricuspid. There is mild calcification of the aortic valve. There is mild thickening of the aortic valve. Aortic valve regurgitation is trivial. Aortic valve sclerosis/calcification is present, without any evidence of aortic stenosis.  6. The inferior vena cava is normal in size with greater than 50% respiratory variability, suggesting right atrial pressure of 3 mmHg. Comparison(s): No significant change from prior study. Conclusion(s)/Recommendation(s): Otherwise normal echocardiogram, with minor abnormalities described in the report. FINDINGS  Left Ventricle: Left ventricular ejection fraction, by estimation, is 60 to 65%. The left ventricle has normal function. The left ventricle has no  regional wall motion abnormalities. The left ventricular internal cavity size was normal in size. There is  no left ventricular hypertrophy. Left ventricular diastolic parameters are indeterminate. Right Ventricle: The right ventricular size is normal. Right vetricular wall thickness was not well visualized. Right ventricular systolic function is normal. There is mildly elevated pulmonary artery systolic pressure. The tricuspid regurgitant velocity  is 2.95 m/s, and with an assumed right atrial pressure of 3 mmHg, the estimated right ventricular systolic pressure is 37.8 mmHg. Left Atrium: Left atrial size was mildly dilated. Right Atrium: Right atrial size was normal in size. Pericardium: There is no evidence of pericardial effusion. Mitral Valve: The mitral valve is normal in structure. Moderate to severe mitral annular calcification. Trivial mitral valve regurgitation. No evidence of mitral valve stenosis. Tricuspid Valve: The tricuspid valve is normal in structure. Tricuspid valve regurgitation is mild . No evidence of tricuspid stenosis. Aortic Valve: The aortic valve is tricuspid. There is mild calcification of the aortic valve. There is mild thickening of the aortic valve. Aortic valve regurgitation is trivial. Aortic valve sclerosis/calcification is present, without any evidence of aortic stenosis. Aortic valve mean gradient measures 7.0 mmHg. Aortic valve peak gradient measures 14.1 mmHg. Aortic valve area, by VTI measures 2.92 cm. Pulmonic Valve: The pulmonic valve was grossly normal. Pulmonic valve regurgitation is mild. No evidence of pulmonic stenosis. Aorta: The aortic root, ascending aorta and aortic arch are all structurally normal, with no evidence of dilitation or obstruction. Venous: The inferior vena cava is normal in size with greater than 50% respiratory variability, suggesting right atrial pressure of 3 mmHg. IAS/Shunts: The atrial septum is grossly normal.  LEFT VENTRICLE PLAX 2D LVIDd:          5.35 cm   Diastology LVIDs:         3.60 cm   LV e' medial:    6.16 cm/s LV PW:         0.90 cm   LV E/e' medial:  11.7 LV IVS:        0.85 cm   LV e' lateral:   10.10 cm/s LVOT diam:     2.30 cm   LV E/e' lateral: 7.2 LV SV:         133 LV SV Index:   70 LVOT Area:  4.15 cm  RIGHT VENTRICLE             IVC RV S prime:     14.40 cm/s  IVC diam: 1.20 cm TAPSE (M-mode): 2.7 cm LEFT ATRIUM             Index        RIGHT ATRIUM           Index LA diam:        3.40 cm 1.79 cm/m   RA Area:     14.00 cm LA Vol (A2C):   39.9 ml 20.96 ml/m  RA Volume:   31.40 ml  16.49 ml/m LA Vol (A4C):   59.9 ml 31.46 ml/m LA Biplane Vol: 50.2 ml 26.37 ml/m  AORTIC VALVE                     PULMONIC VALVE AV Area (Vmax):    2.92 cm      PV Vmax:          1.21 m/s AV Area (Vmean):   2.89 cm      PV Peak grad:     5.9 mmHg AV Area (VTI):     2.92 cm      PR End Diast Vel: 5.86 msec AV Vmax:           188.00 cm/s AV Vmean:          124.000 cm/s AV VTI:            0.455 m AV Peak Grad:      14.1 mmHg AV Mean Grad:      7.0 mmHg LVOT Vmax:         132.00 cm/s LVOT Vmean:        86.200 cm/s LVOT VTI:          0.320 m LVOT/AV VTI ratio: 0.70  AORTA Ao Asc diam: 2.80 cm MITRAL VALVE                TRICUSPID VALVE MV Area (PHT): 2.37 cm     TR Peak grad:   34.8 mmHg MV Decel Time: 320 msec     TR Vmax:        295.00 cm/s MV E velocity: 72.30 cm/s MV A velocity: 103.00 cm/s  SHUNTS MV E/A ratio:  0.70         Systemic VTI:  0.32 m                             Systemic Diam: 2.30 cm Jodelle Red MD Electronically signed by Jodelle Red MD Signature Date/Time: 02/15/2023/1:37:51 PM    Final         Scheduled Meds:  [MAR Hold] atorvastatin  20 mg Per Tube Daily   [MAR Hold] budesonide (PULMICORT) nebulizer solution  0.25 mg Nebulization BID   [MAR Hold] Chlorhexidine Gluconate Cloth  6 each Topical Q0600   [MAR Hold] Chlorhexidine Gluconate Cloth  6 each Topical Q0600   [MAR Hold] cholestyramine  4 g Per Tube  TID   [MAR Hold] escitalopram  10 mg Per Tube Daily   [MAR Hold] feeding supplement (PROSource TF20)  60 mL Per Tube Daily   [MAR Hold] heparin  5,000 Units Subcutaneous Q12H   [MAR Hold] hydrocortisone sod succinate (SOLU-CORTEF) inj  100 mg Intravenous Q8H   [MAR Hold] hydrOXYzine  100 mg Oral BID   [MAR Hold] ipratropium-albuterol  3 mL  Nebulization BID   [MAR Hold] midodrine  20 mg Oral TID WC   [MAR Hold] multivitamin  1 tablet Per Tube QHS   [MAR Hold] mupirocin ointment  1 Application Nasal BID   [MAR Hold] mouth rinse  15 mL Mouth Rinse 4 times per day   [MAR Hold] pantoprazole (PROTONIX) IV  40 mg Intravenous Q24H   [MAR Hold] vancomycin variable dose per unstable renal function (pharmacist dosing)   Does not apply See admin instructions   Continuous Infusions:  sodium chloride 10 mL/hr at 02/16/23 1406   feeding supplement (NEPRO CARB STEADY) Stopped (02/16/23 1144)   [MAR Hold] norepinephrine (LEVOPHED) Adult infusion Stopped (02/16/23 1122)   [MAR Hold] piperacillin-tazobactam (ZOSYN)  IV Stopped (02/16/23 1336)     LOS: 3 days   CRITICAL CARE Performed by: Tresa Moore   Total critical care time: 40 minutes  Critical care time was exclusive of separately billable procedures and treating other patients.  Critical care was necessary to treat or prevent imminent or life-threatening deterioration.  Critical care was time spent personally by me on the following activities: development of treatment plan with patient and/or surrogate as well as nursing, discussions with consultants, evaluation of patient's response to treatment, examination of patient, obtaining history from patient or surrogate, ordering and performing treatments and interventions, ordering and review of laboratory studies, ordering and review of radiographic studies, pulse oximetry and re-evaluation of patient's condition.     Tresa Moore, MD [facility]   If 7PM-7AM, please  contact night-coverage  02/16/2023, 2:48 PM

## 2023-02-16 NOTE — TOC Initial Note (Signed)
Transition of Care [facility]) - Initial/Assessment Note    Patient Details  Name: Alexandra Foster MRN: [id number] Date of Birth: 1950/07/26  Transition of Care [facility]) CM/SW Contact:    Margarito Liner, LCSW Phone Number: 02/16/2023, 12:29 PM  Clinical Narrative:  Readmission prevention screen complete. CSW met with patient. Sister at bedside. CSW introduced role and explained that discharge planning would be discussed. PCP is [name], MD. [facility] Transportation Zenaida Niece transports her to appointments and HD. Pharmacy is [facility]. Sister reports issues with getting lancets for several weeks. She is unsure if she has the monitor. CSW notified the assigned pharmacist. Patient lives home with husband. Daughter is her caregiver during the day and sister is her caregiver during the night. Patient is active with [facility] for RN, PT, OT. Patient has a Nurse, adult, hospital bed, wheelchair, and BSC at home. No further concerns. CSW encouraged patient and her sister to contact CSW as needed. CSW will continue to follow patient and her family for support and facilitate return home once stable. Family will see if Medicaid Transportation driver can take her home at discharge. If not, she will need EMS transport.                  Expected Discharge Plan: Home w Home Health Services Barriers to Discharge: Continued Medical Work up   Patient Goals and CMS Choice            Expected Discharge Plan and Services     Post Acute Care Choice: Resumption of Svcs/PTA Provider Living arrangements for the past 2 months: Single Family Home                           HH Arranged: RN, PT, OT [facility] Agency: [facility] Date HH Agency Contacted: 02/16/23   Representative spoke with at [facility] Agency: Duwaine Maxin  Prior Living Arrangements/Services Living arrangements for the past 2 months: Single Family Home Lives with:: Spouse Patient language and need for  interpreter reviewed:: Yes Do you feel safe going back to the place where you live?: Yes      Need for Family Participation in Patient Care: Yes (Comment) Care giver support system in place?: Yes (comment) Current home services: DME, Home OT, Home PT, Home RN Criminal Activity/Legal Involvement Pertinent to Current Situation/Hospitalization: No - Comment as needed  Activities of Daily Living      Permission Sought/Granted Permission sought to share information with : Facility Medical sales representative, Family Supports       Permission granted to share info w AGENCY: [facility]  Permission granted to share info w Relationship: Sister     Emotional Assessment Appearance:: Appears stated age Attitude/Demeanor/Rapport: Gracious, Engaged Affect (typically observed): Accepting Orientation: : Oriented to Self, Oriented to Place Alcohol / Substance Use: Not Applicable Psych Involvement: No (comment)  Admission diagnosis:  Acute cystitis without hematuria [N30.00] Acute encephalopathy [G93.40] Septic shock (HCC) [A41.9, R65.21] Sepsis (HCC) [A41.9] Community acquired pneumonia of right lung, unspecified part of lung [J18.9] Patient Active Problem List   Diagnosis Date Noted   Gastrostomy tube in place (HCC) 02/15/2023   Pressure injury of skin 02/15/2023   Septic shock (HCC) 02/14/2023   Aspiration pneumonia (HCC) 02/13/2023   Adrenal insufficiency (HCC) 02/13/2023   Malnutrition (HCC) 02/11/2023   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   Cholelithiasis without cholecystitis 02/11/2023   Bowel and bladder incontinence 02/11/2023  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   Diarrhea 12/28/2022   Anemia of chronic disease 12/24/2022   Anxiety and depression 12/06/2022   Type 2 diabetes mellitus with chronic kidney disease, with long-term current use of insulin (HCC) 12/06/2022   ESRD on hemodialysis (HCC) 12/06/2022   Subdural hemorrhage (HCC)  05/18/2021   Atrial fibrillation and flutter (HCC)    Sepsis (HCC) 05/04/2021   Personal history of COVID-19 07/14/2019   Osteoarthritis of both knees 01/14/2018   Urge incontinence 03/14/2016   Osteopenia 02/13/2016   Allergic rhinitis 04/13/2015   COPD (chronic obstructive pulmonary disease) (HCC) 09/18/2014   Esophagitis, reflux 09/18/2014   PCP:  [name], MD Pharmacy:   [facility] - [place], [place] - [address] [place] [address] Phone: [phone] Fax: [phone]  [facility] [address] [place] - [address] [place] [address] Phone: [phone] Fax: [phone]     Social Determinants of Health (SDOH) Social History: SDOH Screenings   Food Insecurity: Patient Unable To Answer (12/06/2022)  Housing: High Risk (12/06/2022)  Transportation Needs: No Transportation Needs (12/06/2022)  Utilities: Not At Risk (12/06/2022)  Alcohol Screen: Low Risk  (02/11/2021)  Depression (PHQ2-9): High Risk (02/11/2023)  Financial Resource Strain: Medium Risk (08/09/2020)  Physical Activity: Inactive (08/09/2020)  Social Connections: Moderately Integrated (08/09/2020)  Stress: No Stress Concern Present (08/09/2020)  Tobacco Use: Medium Risk (02/13/2023)   SDOH Interventions:     Readmission Risk Interventions    02/16/2023   12:26 PM 12/26/2022   11:15 AM  Readmission Risk Prevention Plan  Transportation Screening Complete Complete  PCP or Specialist Appt within 3-5 Days Complete   HRI or Home Care Consult Complete   Social Work Consult for Recovery Care Planning/Counseling Complete   Palliative Care Screening Not Applicable   Medication Review [facility]) Complete Complete  PCP or Specialist appointment within 3-5 days of discharge  Complete  HRI or Home Care Consult  Complete  SW Recovery Care/Counseling Consult  Complete  Palliative Care Screening  Not Applicable  Skilled Nursing  Facility  Not Applicable

## 2023-02-16 NOTE — Op Note (Signed)
OPERATIVE NOTE   PRE-OPERATIVE DIAGNOSIS: 1. ESRD   POST-OPERATIVE DIAGNOSIS: same as above  PROCEDURE: Ultrasound guidance for vascular access to the right internal jugular vein Fluoroscopic guidance for placement of catheter Placement of a 19 cm tip to cuff tunneled hemodialysis catheter via the right internal jugular vein  SURGEON: Festus Barren, MD  ANESTHESIA:  Local with Moderate conscious sedation for approximately 15 minutes using 1.5 mg of Versed and 75 mcg of Fentanyl  ESTIMATED BLOOD LOSS: 10 cc  FLUORO TIME: less than one minute  CONTRAST: none  FINDING(S): 1.  Patent right internal jugular vein  SPECIMEN(S):  None  INDICATIONS:   Alexandra Foster is a 72 y.o.female who presents with renal failure.  Her previous PermCath has been removed for concern for infection.  The patient needs long term dialysis access for their ESRD, and a Permcath is necessary.  Risks and benefits are discussed and informed consent is obtained.    DESCRIPTION: After obtaining full informed written consent, the patient was brought back to the vascular suited. The patient's right neck and chest were sterilely prepped and draped in a sterile surgical field was created. Moderate conscious sedation was administered during a face to face encounter with the patient throughout the procedure with my supervision of the RN administering medicines and monitoring the patient's vital signs, pulse oximetry, telemetry and mental status throughout from the start of the procedure until the patient was taken to the recovery room.  The right internal jugular vein was visualized with ultrasound and found to be patent. It was then accessed under direct ultrasound guidance and a permanent image was recorded. A wire was placed. After skin nick and dilatation, the peel-away sheath was placed over the wire. I then turned my attention to an area under the clavicle. Approximately 1-2 fingerbreadths below the clavicle a small  counterincision was created and tunneled from the subclavicular incision to the access site. Using fluoroscopic guidance, a 19 centimeter tip to cuff tunneled hemodialysis catheter was selected, and tunneled from the subclavicular incision to the access site. It was then placed through the peel-away sheath and the peel-away sheath was removed. Using fluoroscopic guidance the catheter tips were parked in the right atrium. The appropriate distal connectors were placed. It withdrew blood well and flushed easily with heparinized saline and a concentrated heparin solution was then placed. It was secured to the chest wall with 2 Prolene sutures. The access incision was closed single 4-0 Monocryl. A 4-0 Monocryl pursestring suture was placed around the exit site. Sterile dressings were placed. The patient tolerated the procedure well and was taken to the recovery room in stable condition.  COMPLICATIONS: None  CONDITION: Stable  Festus Barren, MD 02/16/2023 2:46 PM   This note was created with Dragon Medical transcription system. Any errors in dictation are purely unintentional.

## 2023-02-16 NOTE — Plan of Care (Signed)
  Problem: Coping: Goal: Level of anxiety will decrease Outcome: Progressing   Problem: Pain Management: Goal: General experience of comfort will improve Outcome: Progressing   Problem: Safety: Goal: Ability to remain free from injury will improve Outcome: Progressing

## 2023-02-16 NOTE — Progress Notes (Signed)
[facility]  PROGRESS NOTE   Subjective:   Patient seen in the ICU.  Awake and alert today. Patient is admitted for sepsis but the source is unclear. Her white count is still elevated but overall improved compared to 32.9 at admission down to 26.3. She is also getting IV steroids and has neutrophilia. Currently afebrile.  Objective:  Vital signs: Blood pressure (!) 128/51, pulse (!) 57, temperature 97.9 F (36.6 C), temperature source Axillary, resp. rate 16, height 5\' 4"  (1.626 m), weight 85.4 kg, SpO2 94%.  Intake/Output Summary (Last 24 hours) at 02/16/2023 1727 Last data filed at 02/16/2023 1700 Gross per 24 hour  Intake 1048.24 ml  Output 420 ml  Net 628.24 ml   Filed Weights   02/13/23 0432 02/14/23 1305 02/16/23 0426  Weight: 86 kg 85.1 kg 85.4 kg     Physical Exam: General:  No acute distress  Head:  Normocephalic, atraumatic. Moist oral mucosal membranes  Eyes:  Anicteric  Neck:  Supple  Lungs:   Clear to auscultation, normal effort  Heart:  S1S2 no rubs  Abdomen:   Soft, nontender, bowel sounds present  Extremities:  peripheral edema.  Neurologic:  Awake, alert, following commands  Skin:  No lesions  Access:     Basic Metabolic Panel: Recent Labs  Lab 02/13/23 0047 02/13/23 0438 02/14/23 0500 02/15/23 0424 02/15/23 1344 02/15/23 1651 02/16/23 0414  NA  --  136 139 138  --   --  136  K  --  4.5 3.9 4.5  --   --  4.2  CL  --  105 106 103  --   --  103  CO2  --  20* 16* 25  --   --  20*  GLUCOSE  --  109* 174* 155*  --   --  114*  BUN  --  52* 58* 72*  --   --  78*  CREATININE  --  3.22* 3.94* 4.65*  --   --  4.70*  CALCIUM  --  8.7* 8.4* 8.7*  --   --  8.8*  MG 1.9  --   --   --  2.1 2.0 2.2  PHOS  --   --   --   --  4.6 5.2* 5.2*   GFR: Estimated Creatinine Clearance: 11.1 mL/min (A) (by C-G formula based on SCr of 4.7 mg/dL (H)).  Liver Function Tests: Recent Labs  Lab 02/11/23 1455 02/13/23 0438 02/14/23 0500  02/15/23 0424 02/16/23 0414  AST 144* 139* 127* 99* 144*  ALT 140* 135* 133* 142* 164*  ALKPHOS  --  199* 112 90 116  BILITOT 0.4 0.9 0.9 0.8 0.8  PROT 7.8 7.6 5.8* 6.5 6.4*  ALBUMIN  --  2.7* 2.0* 2.2* 2.2*   No results for input(s): "LIPASE", "AMYLASE" in the last 168 hours. Recent Labs  Lab 02/13/23 0047  AMMONIA 23    CBC: Recent Labs  Lab 02/13/23 0047 02/13/23 0629 02/14/23 0500 02/15/23 0424 02/16/23 0414  WBC 33.2* 12.6* 32.9* 31.2* 26.3*  NEUTROABS 32.0* 6.7  --  29.5* 25.0*  HGB 14.2 13.1 13.4 12.1 11.3*  HCT 47.7* 43.5 42.9 38.4 34.4*  MCV 86.9 85.8 85.0 83.3 78.7*  PLT 283 297 284 295 351     HbA1C: Hgb A1c MFr Bld  Date/Time Value Ref Range Status  02/11/2023 02:55 PM 4.7 <5.7 % of total Hgb Final    Comment:    For the purpose of screening for the presence  of diabetes: . <5.7%       Consistent with the absence of diabetes 5.7-6.4%    Consistent with increased risk for diabetes             (prediabetes) > or =6.5%  Consistent with diabetes . This assay result is consistent with a decreased risk of diabetes. . Currently, no consensus exists regarding use of hemoglobin A1c for diagnosis of diabetes in children. . According to American Diabetes Association (ADA) guidelines, hemoglobin A1c <7.0% represents optimal control in non-pregnant diabetic patients. Different metrics may apply to specific patient populations.  Standards of Medical Care in Diabetes(ADA). [name]   12/07/2022 02:13 AM 5.2 4.8 - 5.6 % Final    Comment:    (NOTE) Pre diabetes:          5.7%-6.4%  Diabetes:              >6.4%  Glycemic control for   <7.0% adults with diabetes     Urinalysis: No results for input(s): "COLORURINE", "LABSPEC", "PHURINE", "GLUCOSEU", "HGBUR", "BILIRUBINUR", "KETONESUR", "PROTEINUR", "UROBILINOGEN", "NITRITE", "LEUKOCYTESUR" in the last 72 hours.  Invalid input(s): "APPERANCEUR"     Imaging: PERIPHERAL VASCULAR CATHETERIZATION  Result  Date: 02/16/2023 See surgical note for result.  ECHOCARDIOGRAM COMPLETE  Result Date: 02/15/2023    ECHOCARDIOGRAM REPORT   Patient Name:   Mariesa Grieder Rihn Date of Exam: 02/15/2023 Medical Rec #:  [id number]            Height:       64.0 in Accession #:    [id number]          Weight:       187.6 lb Date of Birth:  Dec 17, 1950           BSA:          1.904 m Patient Age:    72 years            BP:           93/33 mmHg Patient Gender: F                   HR:           60 bpm. Exam Location:  [facility] Procedure: 2D Echo, Cardiac Doppler and Color Doppler Indications:     Dyspnea R06.00  History:         Patient has prior history of Echocardiogram examinations, most                  recent 12/07/2022. COPD and ESRD; Risk Factors:Former Smoker and                  Sleep Apnea.  Sonographer:     Dondra Prader RVT RCS Referring Phys:  Raechel Chute Diagnosing Phys: Jodelle Red MD  Sonographer Comments: Dyspnea R06.00 IMPRESSIONS  1. Left ventricular ejection fraction, by estimation, is 60 to 65%. The left ventricle has normal function. The left ventricle has no regional wall motion abnormalities. Left ventricular diastolic parameters are indeterminate.  2. Right ventricular systolic function is normal. The right ventricular size is normal. There is mildly elevated pulmonary artery systolic pressure. The estimated right ventricular systolic pressure is 37.8 mmHg.  3. Left atrial size was mildly dilated.  4. The mitral valve is normal in structure. Trivial mitral valve regurgitation. No evidence of mitral stenosis. Moderate to severe mitral annular calcification.  5. The aortic valve is tricuspid. There is mild calcification of the aortic valve. There is mild  thickening of the aortic valve. Aortic valve regurgitation is trivial. Aortic valve sclerosis/calcification is present, without any evidence of aortic stenosis.  6. The inferior vena cava is normal in size with greater than 50% respiratory variability, suggesting  right atrial pressure of 3 mmHg. Comparison(s): No significant change from prior study. Conclusion(s)/Recommendation(s): Otherwise normal echocardiogram, with minor abnormalities described in the report. FINDINGS  Left Ventricle: Left ventricular ejection fraction, by estimation, is 60 to 65%. The left ventricle has normal function. The left ventricle has no regional wall motion abnormalities. The left ventricular internal cavity size was normal in size. There is  no left ventricular hypertrophy. Left ventricular diastolic parameters are indeterminate. Right Ventricle: The right ventricular size is normal. Right vetricular wall thickness was not well visualized. Right ventricular systolic function is normal. There is mildly elevated pulmonary artery systolic pressure. The tricuspid regurgitant velocity  is 2.95 m/s, and with an assumed right atrial pressure of 3 mmHg, the estimated right ventricular systolic pressure is 37.8 mmHg. Left Atrium: Left atrial size was mildly dilated. Right Atrium: Right atrial size was normal in size. Pericardium: There is no evidence of pericardial effusion. Mitral Valve: The mitral valve is normal in structure. Moderate to severe mitral annular calcification. Trivial mitral valve regurgitation. No evidence of mitral valve stenosis. Tricuspid Valve: The tricuspid valve is normal in structure. Tricuspid valve regurgitation is mild . No evidence of tricuspid stenosis. Aortic Valve: The aortic valve is tricuspid. There is mild calcification of the aortic valve. There is mild thickening of the aortic valve. Aortic valve regurgitation is trivial. Aortic valve sclerosis/calcification is present, without any evidence of aortic stenosis. Aortic valve mean gradient measures 7.0 mmHg. Aortic valve peak gradient measures 14.1 mmHg. Aortic valve area, by VTI measures 2.92 cm. Pulmonic Valve: The pulmonic valve was grossly normal. Pulmonic valve regurgitation is mild. No evidence of pulmonic  stenosis. Aorta: The aortic root, ascending aorta and aortic arch are all structurally normal, with no evidence of dilitation or obstruction. Venous: The inferior vena cava is normal in size with greater than 50% respiratory variability, suggesting right atrial pressure of 3 mmHg. IAS/Shunts: The atrial septum is grossly normal.  LEFT VENTRICLE PLAX 2D LVIDd:         5.35 cm   Diastology LVIDs:         3.60 cm   LV e' medial:    6.16 cm/s LV PW:         0.90 cm   LV E/e' medial:  11.7 LV IVS:        0.85 cm   LV e' lateral:   10.10 cm/s LVOT diam:     2.30 cm   LV E/e' lateral: 7.2 LV SV:         133 LV SV Index:   70 LVOT Area:     4.15 cm  RIGHT VENTRICLE             IVC RV S prime:     14.40 cm/s  IVC diam: 1.20 cm TAPSE (M-mode): 2.7 cm LEFT ATRIUM             Index        RIGHT ATRIUM           Index LA diam:        3.40 cm 1.79 cm/m   RA Area:     14.00 cm LA Vol (A2C):   39.9 ml 20.96 ml/m  RA Volume:   31.40 ml  16.49 ml/m  LA Vol (A4C):   59.9 ml 31.46 ml/m LA Biplane Vol: 50.2 ml 26.37 ml/m  AORTIC VALVE                     PULMONIC VALVE AV Area (Vmax):    2.92 cm      PV Vmax:          1.21 m/s AV Area (Vmean):   2.89 cm      PV Peak grad:     5.9 mmHg AV Area (VTI):     2.92 cm      PR End Diast Vel: 5.86 msec AV Vmax:           188.00 cm/s AV Vmean:          124.000 cm/s AV VTI:            0.455 m AV Peak Grad:      14.1 mmHg AV Mean Grad:      7.0 mmHg LVOT Vmax:         132.00 cm/s LVOT Vmean:        86.200 cm/s LVOT VTI:          0.320 m LVOT/AV VTI ratio: 0.70  AORTA Ao Asc diam: 2.80 cm MITRAL VALVE                TRICUSPID VALVE MV Area (PHT): 2.37 cm     TR Peak grad:   34.8 mmHg MV Decel Time: 320 msec     TR Vmax:        295.00 cm/s MV E velocity: 72.30 cm/s MV A velocity: 103.00 cm/s  SHUNTS MV E/A ratio:  0.70         Systemic VTI:  0.32 m                             Systemic Diam: 2.30 cm Jodelle Red MD Electronically signed by Jodelle Red MD Signature  Date/Time: 02/15/2023/1:37:51 PM    Final      Medications:    feeding supplement (NEPRO CARB STEADY) Stopped (02/16/23 1144)   piperacillin-tazobactam (ZOSYN)  IV Stopped (02/16/23 1336)    atorvastatin  20 mg Per Tube Daily   budesonide (PULMICORT) nebulizer solution  0.25 mg Nebulization BID   Chlorhexidine Gluconate Cloth  6 each Topical Q0600   cholestyramine  4 g Per Tube TID   escitalopram  10 mg Per Tube Daily   feeding supplement (PROSource TF20)  60 mL Per Tube Daily   heparin  5,000 Units Subcutaneous Q12H   hydrocortisone sod succinate (SOLU-CORTEF) inj  100 mg Intravenous Q8H   hydrOXYzine  100 mg Oral BID   ipratropium-albuterol  3 mL Nebulization BID   midodrine  20 mg Oral TID WC   multivitamin  1 tablet Per Tube QHS   mupirocin ointment  1 Application Nasal BID   mouth rinse  15 mL Mouth Rinse 4 times per day   pantoprazole (PROTONIX) IV  40 mg Intravenous Q24H   vancomycin variable dose per unstable renal function (pharmacist dosing)   Does not apply See admin instructions    Assessment/ Plan:     72 year old female with history of hypertension, coronary artery disease, congestive heart failure, COPD, cirrhosis, diabetes, status post left AKA, status post G-tube placement.  Patient is now being admitted for altered mental status.  Her last dialysis treatment was on Thursday.  She is also found to  be septic.  The permacath was removed.    #1: End-stage renal disease: Last treatment was on Thursday.  PermCath has been removed.  Source of infection is not clear.  Catheter tip cultures are negative so far.  Currently receiving empiric vancomycin and Zosyn. Next hemodialysis planned for Tuesday or Wednesday depending on access availability.   #2: Hypotension:   Receiving midodrine.  Last blood pressure 103/58 this afternoon.   #3: Sepsis: Blood cultures done.  Patient is now on Zosyn, vancomycin     #4: Secondary hyperparathyroidism: Will continue to monitor  closely.  Spoke to the patient's husband who is at bedside. Labs and medications reviewed. Will continue to follow along with you.   LOS: 3 Mosetta Pigeon, MD [facility] 11/4/20245:27 PM

## 2023-02-16 NOTE — Progress Notes (Signed)
Per Dr. Georgeann Oppenheim and Wendee Beavers,  hold tube feedings and medications for now as pt will be having a new perm cath placed today.

## 2023-02-17 ENCOUNTER — Encounter: Payer: Self-pay | Admitting: Vascular Surgery

## 2023-02-17 DIAGNOSIS — G9341 Metabolic encephalopathy: Secondary | ICD-10-CM | POA: Diagnosis not present

## 2023-02-17 DIAGNOSIS — R6520 Severe sepsis without septic shock: Secondary | ICD-10-CM | POA: Diagnosis not present

## 2023-02-17 DIAGNOSIS — A419 Sepsis, unspecified organism: Secondary | ICD-10-CM | POA: Diagnosis not present

## 2023-02-17 LAB — COMPREHENSIVE METABOLIC PANEL
ALT: 197 U/L — ABNORMAL HIGH (ref 0–44)
AST: 155 U/L — ABNORMAL HIGH (ref 15–41)
Albumin: 2.3 g/dL — ABNORMAL LOW (ref 3.5–5.0)
Alkaline Phosphatase: 131 U/L — ABNORMAL HIGH (ref 38–126)
Anion gap: 11 (ref 5–15)
BUN: 85 mg/dL — ABNORMAL HIGH (ref 8–23)
CO2: 19 mmol/L — ABNORMAL LOW (ref 22–32)
Calcium: 9 mg/dL (ref 8.9–10.3)
Chloride: 107 mmol/L (ref 98–111)
Creatinine, Ser: 4.97 mg/dL — ABNORMAL HIGH (ref 0.44–1.00)
GFR, Estimated: 9 mL/min — ABNORMAL LOW (ref >60)
Glucose, Bld: 144 mg/dL — ABNORMAL HIGH (ref 70–99)
Potassium: 4.2 mmol/L (ref 3.5–5.1)
Sodium: 137 mmol/L (ref 135–145)
Total Bilirubin: 0.7 mg/dL (ref <1.2)
Total Protein: 6.3 g/dL — ABNORMAL LOW (ref 6.5–8.1)

## 2023-02-17 LAB — CBC WITH DIFFERENTIAL/PLATELET
Abs Immature Granulocytes: 0.17 10*3/uL — ABNORMAL HIGH (ref 0.00–0.07)
Basophils Absolute: 0 10*3/uL (ref 0.0–0.1)
Basophils Relative: 0 %
Eosinophils Absolute: 0 10*3/uL (ref 0.0–0.5)
Eosinophils Relative: 0 %
HCT: 33 % — ABNORMAL LOW (ref 36.0–46.0)
Hemoglobin: 10.7 g/dL — ABNORMAL LOW (ref 12.0–15.0)
Immature Granulocytes: 1 %
Lymphocytes Relative: 4 %
Lymphs Abs: 0.7 10*3/uL (ref 0.7–4.0)
MCH: 25.8 pg — ABNORMAL LOW (ref 26.0–34.0)
MCHC: 32.4 g/dL (ref 30.0–36.0)
MCV: 79.5 fL — ABNORMAL LOW (ref 80.0–100.0)
Monocytes Absolute: 0.4 10*3/uL (ref 0.1–1.0)
Monocytes Relative: 2 %
Neutro Abs: 15.8 10*3/uL — ABNORMAL HIGH (ref 1.7–7.7)
Neutrophils Relative %: 93 %
Platelets: 353 10*3/uL (ref 150–400)
RBC: 4.15 MIL/uL (ref 3.87–5.11)
RDW: 14.7 % (ref 11.5–15.5)
WBC: 17.1 10*3/uL — ABNORMAL HIGH (ref 4.0–10.5)
nRBC: 0.2 % (ref 0.0–0.2)

## 2023-02-17 LAB — GLUCOSE, CAPILLARY
Glucose-Capillary: 99 mg/dL (ref 70–99)
Glucose-Capillary: 145 mg/dL — ABNORMAL HIGH (ref 70–99)
Glucose-Capillary: 151 mg/dL — ABNORMAL HIGH (ref 70–99)
Glucose-Capillary: 137 mg/dL — ABNORMAL HIGH (ref 70–99)

## 2023-02-17 LAB — AEROBIC CULTURE W GRAM STAIN (SUPERFICIAL SPECIMEN): Culture: NO GROWTH

## 2023-02-17 LAB — MAGNESIUM: Magnesium: 2.4 mg/dL (ref 1.7–2.4)

## 2023-02-17 LAB — HEPATITIS B SURFACE ANTIGEN: Hepatitis B Surface Ag: NONREACTIVE

## 2023-02-17 LAB — PHOSPHORUS: Phosphorus: 5.4 mg/dL — ABNORMAL HIGH (ref 2.5–4.6)

## 2023-02-17 MED ORDER — JUVEN PO PACK
1.0000 | PACK | Freq: Two times a day (BID) | ORAL | Status: DC
  Administered 2023-02-18 – 2023-02-20 (×4): 1

## 2023-02-17 MED ORDER — ALBUMIN HUMAN 25 % IV SOLN
INTRAVENOUS | Status: AC
Start: 2023-02-17 — End: ?
  Filled 2023-02-17: qty 100

## 2023-02-17 MED ORDER — HEPARIN SODIUM (PORCINE) 10000 UNIT/ML IJ SOLN
INTRAMUSCULAR | Status: DC | PRN
  Administered 2023-02-16: 10000 [IU]

## 2023-02-17 MED ORDER — FREE WATER
30.0000 mL | Status: DC
  Administered 2023-02-17 – 2023-02-20 (×20): 30 mL

## 2023-02-17 MED ORDER — VANCOMYCIN HCL IN DEXTROSE 1-5 GM/200ML-% IV SOLN
1000.0000 mg | Freq: Once | INTRAVENOUS | Status: AC
  Administered 2023-02-17: 1000 mg via INTRAVENOUS
  Filled 2023-02-17: qty 200

## 2023-02-17 MED ORDER — PIPERACILLIN-TAZOBACTAM IN DEX 2-0.25 GM/50ML IV SOLN
2.2500 g | Freq: Three times a day (TID) | INTRAVENOUS | Status: DC
  Administered 2023-02-17 – 2023-02-18 (×4): 2.25 g via INTRAVENOUS
  Filled 2023-02-17 (×5): qty 50

## 2023-02-17 MED ORDER — ALBUMIN HUMAN 25 % IV SOLN
25.0000 g | Freq: Once | INTRAVENOUS | Status: AC
  Administered 2023-02-17: 25 g via INTRAVENOUS

## 2023-02-17 NOTE — Progress Notes (Signed)
  Received patient in bed to unit.   Informed consent signed and in chart.    TX duration: 3.5hrs     Transported back to floor  Hand-off given to patient's nurse. No c/o and no distress noted    Access used: R HD Catheter  Access issues: none   Total UF removed:  1.0L Medication(s) given: albumin Post HD VS:  100/44 Post HD weight: 85.4kg     Lynann Beaver  [facility]

## 2023-02-17 NOTE — Plan of Care (Signed)
  Problem: Clinical Measurements: Goal: Ability to maintain clinical measurements within normal limits will improve Outcome: Progressing Goal: Will remain free from infection Outcome: Progressing Goal: Respiratory complications will improve Outcome: Progressing   Problem: Pain Management: Goal: General experience of comfort will improve Outcome: Progressing   Problem: Safety: Goal: Ability to remain free from injury will improve Outcome: Progressing   Problem: Education: Goal: Knowledge of General Education information will improve Description: Including pain rating scale, medication(s)/side effects and non-pharmacologic comfort measures Outcome: Not Progressing

## 2023-02-17 NOTE — Progress Notes (Signed)
[facility]  PROGRESS NOTE   Subjective:   Patient seen laying in bed Alert, pleasant Denies pain  Dialysis scheduled for later today.   Objective:  Vital signs: Blood pressure (!) 103/49, pulse (!) 59, temperature 98.8 F (37.1 C), temperature source Axillary, resp. rate 14, height 5\' 4"  (1.626 m), weight 86.3 kg, SpO2 94%.  Intake/Output Summary (Last 24 hours) at 02/17/2023 1604 Last data filed at 02/17/2023 1300 Gross per 24 hour  Intake 1060.04 ml  Output 500 ml  Net 560.04 ml   Filed Weights   02/16/23 0426 02/17/23 0500 02/17/23 1320  Weight: 85.4 kg 90.9 kg 86.3 kg     Physical Exam: General:  No acute distress  Head:  Normocephalic, atraumatic. Moist oral mucosal membranes  Eyes:  Anicteric  Lungs:   Clear to auscultation, normal effort  Heart:  S1S2 no rubs  Abdomen:   Soft, nontender, bowel sounds present  Extremities:  Trace peripheral edema.  Neurologic:  Awake, alert, following commands  Skin:  No lesions  Access: Rt chest permcath    Basic Metabolic Panel: Recent Labs  Lab 02/13/23 0047 02/13/23 0438 02/14/23 0500 02/15/23 0424 02/15/23 1344 02/15/23 1651 02/16/23 0414 02/16/23 1724 02/17/23 0437  NA  --  136 139 138  --   --  136  --  137  K  --  4.5 3.9 4.5  --   --  4.2  --  4.2  CL  --  105 106 103  --   --  103  --  107  CO2  --  20* 16* 25  --   --  20*  --  19*  GLUCOSE  --  109* 174* 155*  --   --  114*  --  144*  BUN  --  52* 58* 72*  --   --  78*  --  85*  CREATININE  --  3.22* 3.94* 4.65*  --   --  4.70*  --  4.97*  CALCIUM  --  8.7* 8.4* 8.7*  --   --  8.8*  --  9.0  MG 1.9  --   --   --  2.1 2.0 2.2  --  2.4  PHOS  --   --   --   --  4.6 5.2* 5.2* 5.0* 5.4*   GFR: Estimated Creatinine Clearance: 10.6 mL/min (A) (by C-G formula based on SCr of 4.97 mg/dL (H)).  Liver Function Tests: Recent Labs  Lab 02/13/23 0438 02/14/23 0500 02/15/23 0424 02/16/23 0414 02/17/23 0437  AST 139* 127* 99* 144* 155*  ALT  135* 133* 142* 164* 197*  ALKPHOS 199* 112 90 116 131*  BILITOT 0.9 0.9 0.8 0.8 0.7  PROT 7.6 5.8* 6.5 6.4* 6.3*  ALBUMIN 2.7* 2.0* 2.2* 2.2* 2.3*   No results for input(s): "LIPASE", "AMYLASE" in the last 168 hours. Recent Labs  Lab 02/13/23 0047  AMMONIA 23    CBC: Recent Labs  Lab 02/13/23 0047 02/13/23 0629 02/14/23 0500 02/15/23 0424 02/16/23 0414 02/17/23 0437  WBC 33.2* 12.6* 32.9* 31.2* 26.3* 17.1*  NEUTROABS 32.0* 6.7  --  29.5* 25.0* 15.8*  HGB 14.2 13.1 13.4 12.1 11.3* 10.7*  HCT 47.7* 43.5 42.9 38.4 34.4* 33.0*  MCV 86.9 85.8 85.0 83.3 78.7* 79.5*  PLT 283 297 284 295 351 353     HbA1C: Hgb A1c MFr Bld  Date/Time Value Ref Range Status  02/11/2023 02:55 PM 4.7 <5.7 % of total Hgb Final  Comment:    For the purpose of screening for the presence of diabetes: . <5.7%       Consistent with the absence of diabetes 5.7-6.4%    Consistent with increased risk for diabetes             (prediabetes) > or =6.5%  Consistent with diabetes . This assay result is consistent with a decreased risk of diabetes. . Currently, no consensus exists regarding use of hemoglobin A1c for diagnosis of diabetes in children. . According to American Diabetes Association (ADA) guidelines, hemoglobin A1c <7.0% represents optimal control in non-pregnant diabetic patients. Different metrics may apply to specific patient populations.  Standards of Medical Care in Diabetes(ADA). [name]   12/07/2022 02:13 AM 5.2 4.8 - 5.6 % Final    Comment:    (NOTE) Pre diabetes:          5.7%-6.4%  Diabetes:              >6.4%  Glycemic control for   <7.0% adults with diabetes     Urinalysis: No results for input(s): "COLORURINE", "LABSPEC", "PHURINE", "GLUCOSEU", "HGBUR", "BILIRUBINUR", "KETONESUR", "PROTEINUR", "UROBILINOGEN", "NITRITE", "LEUKOCYTESUR" in the last 72 hours.  Invalid input(s): "APPERANCEUR"     Imaging: PERIPHERAL VASCULAR CATHETERIZATION  Result Date:  02/16/2023 See surgical note for result.    Medications:    feeding supplement (NEPRO CARB STEADY) 45 mL/hr at 02/17/23 1300   piperacillin-tazobactam (ZOSYN)  IV Stopped (02/17/23 0704)   vancomycin      atorvastatin  20 mg Per Tube Daily   budesonide (PULMICORT) nebulizer solution  0.25 mg Nebulization BID   Chlorhexidine Gluconate Cloth  6 each Topical Q0600   cholestyramine  4 g Per Tube TID   escitalopram  10 mg Per Tube Daily   feeding supplement (PROSource TF20)  60 mL Per Tube Daily   free water  30 mL Per Tube Q4H   heparin  5,000 Units Subcutaneous Q12H   hydrocortisone sod succinate (SOLU-CORTEF) inj  100 mg Intravenous Q8H   hydrOXYzine  100 mg Oral BID   ipratropium-albuterol  3 mL Nebulization BID   midodrine  20 mg Oral TID WC   multivitamin  1 tablet Per Tube QHS   mupirocin ointment  1 Application Nasal BID   nutrition supplement (JUVEN)  1 packet Per Tube BID BM   mouth rinse  15 mL Mouth Rinse 4 times per day   pantoprazole (PROTONIX) IV  40 mg Intravenous Q24H   vancomycin variable dose per unstable renal function (pharmacist dosing)   Does not apply See admin instructions    Assessment/ Plan:     72 year old female with history of hypertension, coronary artery disease, congestive heart failure, COPD, cirrhosis, diabetes, status post left AKA, status post G-tube placement.  Patient is now being admitted for altered mental status.  Her last dialysis treatment was on Thursday.  She is also found to be septic.  The permacath was removed.    #1: End-stage renal disease: Last treatment was on Thursday.  PermCath has been removed.  Source of infection is not clear.  Catheter tip cultures are negative so far.  Currently receiving empiric vancomycin and Zosyn. Receiving dialysis today, UF goal 1L as tolerated. Next treatment scheduled on Thursday.    #2: Hypotension:   Receiving midodrine.  Will offer Albumin with dialysis, if needed, for BP support.   #3: Sepsis:  Blood cultures done.  Continue IV Zosyn and vancomycin     #4: Secondary hyperparathyroidism: Will  continue to monitor closely.    LOS: 4 [facility] 11/5/20244:04 PM

## 2023-02-17 NOTE — Plan of Care (Signed)
  Problem: Clinical Measurements: Goal: Respiratory complications will improve Outcome: Progressing Goal: Cardiovascular complication will be avoided Outcome: Progressing   Problem: Elimination: Goal: Will not experience complications related to urinary retention Outcome: Progressing   Problem: Pain Management: Goal: General experience of comfort will improve Outcome: Progressing   Problem: Safety: Goal: Ability to remain free from injury will improve Outcome: Progressing   Problem: Skin Integrity: Goal: Risk for impaired skin integrity will decrease Outcome: Progressing

## 2023-02-17 NOTE — Progress Notes (Signed)
Nutrition Follow-up  DOCUMENTATION CODES:   Not applicable  INTERVENTION:   Nepro@45ml /hr continuous + ProSource TF 20- Give 60ml daily via tube  Free water flushes 30ml q4 hours to maintain tube patency   Regimen provides 2024kcal/day, 107g/day protein and 983ml/day of free water.   Rena-vit daily via tube   Juven Fruit Punch BID via tube, each serving provides 95kcal and 2.5g of protein (amino acids glutamine and arginine)  Daily weights   Check copper lab  NUTRITION DIAGNOSIS:   Increased nutrient needs related to chronic illness as evidenced by estimated needs.  GOAL:   Patient will meet greater than or equal to 90% of their needs -met with tube feeds   MONITOR:   Labs, Weight trends, TF tolerance, I & O's, Skin  ASSESSMENT:   72 y.o. female with a history of asthma/COPD, HTN, DMII, obesity, OSA on CPAP, SDH, GERD, ESRD on HD, afib, anxiety, depression, possible cirrhosis and a lengthy admission (04/2021) for necrotizing fasciitis, bacteremia and sepsis (requiring L AKA, tracheostomy and G-tube placement) and recent admission for MRSA & Staph epi bacteremia from right chest permcath and UTI and who is now admitted with toxic metabolic encephalopathy and septic shock.  Pt s/p PermCath placement 11/4  Met with pt and family member in room. Pt reports that she is feeling ok. Family reports that pt continues on her home tube feed regimen of Nepro boluses, four cartons daily via tube along with 30ml flushes before and after each feed (provides 1680kcal/day, 76g/day protein and 960ml/day of free water). Pt is tolerating continuous tube feeds in hospital. Pt with suspected gastroparesis and does vomit intermittently at baseline. Pt does not take anything by mouth. Pt had a BM this morning. Per chart, pt appears weight stable pta. Pt with h/o copper deficiency which was treated last admission; will recheck copper lab. Plan is to restart HD today and tomorrow.   Medications  reviewed and include: questran, heparin, solu-cortef, rena-vit, protonix, zosyn, vancomycin   Labs reviewed: K 4.2 wnl, BUN 85(H), creat 4.97(H), P 5.4(H), Mg 2.4(L) Wbc- 17.1(H), Hgb 10.7(L), Hct 33.0(L), MCV 79.5(L), MCH 25.8(L) Cbgs- 145, 103, 99, 117, 107, 123, 120, 120 x 24 hrs   NUTRITION - FOCUSED PHYSICAL EXAM:  Flowsheet Row Most Recent Value  Orbital Region No depletion  Upper Arm Region No depletion  Thoracic and Lumbar Region No depletion  Buccal Region No depletion  Temple Region No depletion  Clavicle Bone Region No depletion  Clavicle and Acromion Bone Region No depletion  Scapular Bone Region No depletion  Dorsal Hand No depletion  Patellar Region No depletion  Anterior Thigh Region No depletion  Posterior Calf Region No depletion  Edema (RD Assessment) Mild  Hair Reviewed  Eyes Reviewed  Mouth Reviewed  Skin Reviewed  Nails Reviewed   Diet Order:   Diet Order             Diet NPO time specified  Diet effective now                  EDUCATION NEEDS:   Not appropriate for education at this time  Skin:  Skin Assessment: Skin Integrity Issues: Skin Integrity Issues:: Stage II Stage II: Buttocks  Last BM:  11/5- per RN  Height:   Ht Readings from Last 1 Encounters:  02/13/23 5\' 4"  (1.626 m)    Weight:   Wt Readings from Last 1 Encounters:  02/17/23 90.9 kg   BMI:  Body mass index is 34.4 kg/m.  Estimated Nutritional Needs:   Kcal:  1800-2100kcal/day  Protein:  90-105g/day  Fluid:  UOP +1L  Betsey Holiday MS, RD, LDN Please refer to [facility] for RD and/or RD on-call/weekend/after hours pager

## 2023-02-17 NOTE — Progress Notes (Signed)
PROGRESS NOTE    Alexandra Foster  ION:[id number] DOB: 1950-11-23 DOA: 02/13/2023 PCP: [name], MD    Brief Narrative:   72 y.o. female with medical history significant of ESRD on HD TTS, via a left chest PermCath, asthma/COPD, severe dysphagia status post G-tube, IIDM, HTN, left AKA, PAF, OSA on CPAP, cirrhosis, adrenal insufficiency on Cortef, brought in by family member for evaluation of sepsis.   Patient is confused, or history provided by daughter over the phone and patient's sister at bedside.  Patient started to have altered mentations yesterday and threw up x 1 during dialysis.  Overnight, patient became more confused.  According to daughter, patient's G-tube insertion site has a possible infected as she has been seeing more yellow-greenish pus coming out from around the G-tube entrance and she had to change the gauze of the G-tube 2 times a day to clean the past.  However patient does have a visiting wound care nurse seeing her 2 weeks sick day and according to the wound care team, the G-tube site was not infected.  In addition, daughter also reported that for the last 2 HD sessions, the outpatient HD team reported that the PermCath on left chest " has some narrowing and blood flow very slow" and patient was scheduled to have PermCath exchanged next week.  Patient denied any fever chills no diarrhea no abdominal pain or chest pain.   Assessment & Plan:   Principal Problem:   Sepsis (HCC) Active Problems:   Aspiration pneumonia (HCC)   Septic shock (HCC)   Gastrostomy tube in place (HCC)   Pressure injury of skin  Septic shock, improved Patient has had low blood pressure despite multiple fluid boluses.  Sepsis criteria met with leukocytosis, tachycardia, fever.  Potential sources include chest dialysis catheter versus gastric tube.  Higher suspicion that HD catheter site may be infected. Patient's maps remained low despite treatment with oral midodrine and IV stress dose  steroids Plan: Patient has been weaned from Levophed.  Will transfer out of intensive care.  Continue midodrine 20 mg 3 times daily via tube.  Continue hydrocortisone 100 mg IV every 8 hours.  MAP goal of 60.  Continue broad-spectrum IV coverage for now.  Attempt to de-escalate antibiotics in AM  PEG tube was evaluated for septic focus by general surgery.  Do not feel that PEG tube is infected.  Tube feeds reinitiated.  Patient was given a line holiday for 48 hours after removal of HD PermCath.  PermCath now back in place in right chest  Continue with daily labs.  Acute metabolic encephalopathy Likely secondary to sepsis Mentating clearly on my evaluation   Acute transaminitis Downtrending, likely related to sepsis Daily LFTs   ESRD on HD with access difficulties Case discussed with on-call nephrology.  Patient was last dialyzed Thursday 10/31.  HD catheter was removed by vascular surgery Saturday 11/2.  Vascular surgery reengaged and will replace vascular access 11/4. Plan: HD today 11/5  PAF Patient not on anticoagulation for unclear reasons.   Amiodarone on hold given resting bradycardia Continue to monitor on telemetry. Consider restarting amiodarone 11/6  G-tube Severe dysphagia General Surgery consult requested and appreciated.   Patient will remain n.p.o. as she has severe dysphagia.  All medications via tube.   Tube feeds restarted 11/3  Chronic adrenal insufficiency Midodrine 20 mg 3 times daily.   Stress dose steroids 100 mg IV every 8 hours Home Solu-Cortef on hold  OSA CPAP nightly   DVT prophylaxis: SQ  heparin Code Status: Full Family Communication:Daughter [name] [phone] on 11/2, 11/5 Disposition Plan: Status is: Inpatient Remains inpatient appropriate because: Multiple acute issues as above   Level of care: Progressive  Consultants:  PCCM Nephrology Vascular surgery Palliative care General Surgery  Procedures:   None  Antimicrobials: Vancomycin Zosyn   Subjective: Seen and examined.  Shock physiology resolved.  Objective: Vitals:   02/17/23 1339 02/17/23 1356 02/17/23 1400 02/17/23 1430  BP: (!) 114/59  (!) 112/49 (!) 101/51  Pulse: (!) 57 (!) 56 (!) 55 (!) 56  Resp:   15 14  Temp:      TempSrc:      SpO2: 94% 95% 95% 95%  Weight:      Height:        Intake/Output Summary (Last 24 hours) at 02/17/2023 1517 Last data filed at 02/17/2023 1300 Gross per 24 hour  Intake 1079.04 ml  Output 500 ml  Net 579.04 ml   Filed Weights   02/16/23 0426 02/17/23 0500 02/17/23 1320  Weight: 85.4 kg 90.9 kg 86.3 kg    Examination:  General exam: No acute distress.  Appears fatigued Respiratory system: Lungs clear overall.  Normal work of breathing.  2 L Cardiovascular system: S1-S2, RRR, no murmurs, no pedal edema  Gastrointestinal system: Soft, NT/ND, normal bowel sounds, + G-tube Central nervous system: Oriented x 2.  No focal deficits Extremities: Decreased power bilateral lower extremities.  Gait not assessed Skin: No rashes, lesions or ulcers Psychiatry: Judgement and insight appear impaired. Mood & affect flattened.     Data Reviewed: I have personally reviewed following labs and imaging studies  CBC: Recent Labs  Lab 02/13/23 0047 02/13/23 0629 02/14/23 0500 02/15/23 0424 02/16/23 0414 02/17/23 0437  WBC 33.2* 12.6* 32.9* 31.2* 26.3* 17.1*  NEUTROABS 32.0* 6.7  --  29.5* 25.0* 15.8*  HGB 14.2 13.1 13.4 12.1 11.3* 10.7*  HCT 47.7* 43.5 42.9 38.4 34.4* 33.0*  MCV 86.9 85.8 85.0 83.3 78.7* 79.5*  PLT 283 297 284 295 351 353   Basic Metabolic Panel: Recent Labs  Lab 02/13/23 0047 02/13/23 0438 02/14/23 0500 02/15/23 0424 02/15/23 1344 02/15/23 1651 02/16/23 0414 02/16/23 1724 02/17/23 0437  NA  --  136 139 138  --   --  136  --  137  K  --  4.5 3.9 4.5  --   --  4.2  --  4.2  CL  --  105 106 103  --   --  103  --  107  CO2  --  20* 16* 25  --   --  20*  --   19*  GLUCOSE  --  109* 174* 155*  --   --  114*  --  144*  BUN  --  52* 58* 72*  --   --  78*  --  85*  CREATININE  --  3.22* 3.94* 4.65*  --   --  4.70*  --  4.97*  CALCIUM  --  8.7* 8.4* 8.7*  --   --  8.8*  --  9.0  MG 1.9  --   --   --  2.1 2.0 2.2  --  2.4  PHOS  --   --   --   --  4.6 5.2* 5.2* 5.0* 5.4*   GFR: Estimated Creatinine Clearance: 10.6 mL/min (A) (by C-G formula based on SCr of 4.97 mg/dL (H)). Liver Function Tests: Recent Labs  Lab 02/13/23 0438 02/14/23 0500 02/15/23 0424 02/16/23 0414 02/17/23 2778  AST 139* 127* 99* 144* 155*  ALT 135* 133* 142* 164* 197*  ALKPHOS 199* 112 90 116 131*  BILITOT 0.9 0.9 0.8 0.8 0.7  PROT 7.6 5.8* 6.5 6.4* 6.3*  ALBUMIN 2.7* 2.0* 2.2* 2.2* 2.3*   No results for input(s): "LIPASE", "AMYLASE" in the last 168 hours. Recent Labs  Lab 02/13/23 0047  AMMONIA 23   Coagulation Profile: Recent Labs  Lab 02/13/23 0438  INR 1.2   Cardiac Enzymes: No results for input(s): "CKTOTAL", "CKMB", "CKMBINDEX", "TROPONINI" in the last 168 hours. BNP (last 3 results) No results for input(s): "PROBNP" in the last 8760 hours. HbA1C: No results for input(s): "HGBA1C" in the last 72 hours.  CBG: Recent Labs  Lab 02/16/23 1542 02/16/23 2037 02/16/23 2317 02/17/23 0729 02/17/23 1206  GLUCAP 117* 99 103* 145* 151*   Lipid Profile: No results for input(s): "CHOL", "HDL", "LDLCALC", "TRIG", "CHOLHDL", "LDLDIRECT" in the last 72 hours.  Thyroid Function Tests: No results for input(s): "TSH", "T4TOTAL", "FREET4", "T3FREE", "THYROIDAB" in the last 72 hours.  Anemia Panel: No results for input(s): "VITAMINB12", "FOLATE", "FERRITIN", "TIBC", "IRON", "RETICCTPCT" in the last 72 hours. Sepsis Labs: Recent Labs  Lab 02/14/23 0500 02/14/23 0845 02/14/23 1655 02/15/23 1937  LATICACIDVEN 5.5* 3.8* 4.6* 1.5    Recent Results (from the past 240 hour(s))  Culture, blood (Routine x 2)     Status: None (Preliminary result)   Collection  Time: 02/13/23  4:39 AM   Specimen: BLOOD  Result Value Ref Range Status   Specimen Description BLOOD BLOOD LEFT ARM  Final   Special Requests   Final    BOTTLES DRAWN AEROBIC AND ANAEROBIC Blood Culture adequate volume   Culture   Final    NO GROWTH 4 DAYS Performed at [facility], [address], [place], [place] [address]    Report Status PENDING  Incomplete  Culture, blood (Routine x 2)     Status: None (Preliminary result)   Collection Time: 02/13/23  4:39 AM   Specimen: BLOOD  Result Value Ref Range Status   Specimen Description BLOOD BLOOD RIGHT ARM  Final   Special Requests   Final    BOTTLES DRAWN AEROBIC AND ANAEROBIC Blood Culture adequate volume   Culture   Final    NO GROWTH 4 DAYS Performed at [facility], [address], [place], [place] [address]    Report Status PENDING  Incomplete  Urine Culture     Status: Abnormal   Collection Time: 02/13/23  6:29 AM   Specimen: Urine, Random  Result Value Ref Range Status   Specimen Description   Final    URINE, RANDOM Performed at [facility], [address], [place], [place] [address]    Special Requests   Final    NONE Reflexed from [id number] Performed at [facility], [address] [place], [place] [address]    Culture MULTIPLE SPECIES PRESENT, SUGGEST RECOLLECTION (A)  Final   Report Status 02/14/2023 FINAL  Final  Resp panel by RT-PCR (RSV, Flu A&B, Covid) Anterior Nasal Swab     Status: None   Collection Time: 02/13/23  1:30 PM   Specimen: Anterior Nasal Swab  Result Value Ref Range Status   SARS Coronavirus 2 by RT PCR NEGATIVE NEGATIVE Final    Comment: (NOTE) SARS-CoV-2 target nucleic acids are NOT DETECTED.  The SARS-CoV-2 RNA is generally detectable in upper respiratory specimens during the acute phase of infection. The lowest concentration of SARS-CoV-2 viral copies this  assay can detect is 138 copies/mL. A negative result does not preclude  SARS-Cov-2 infection and should not be used as the sole basis for treatment or other patient management decisions. A negative result may occur with  improper specimen collection/handling, submission of specimen other than nasopharyngeal swab, presence of viral mutation(s) within the areas targeted by this assay, and inadequate number of viral copies(<138 copies/mL). A negative result must be combined with clinical observations, patient history, and epidemiological information. The expected result is Negative.  Fact Sheet for Patients:  BloggerCourse.com  Fact Sheet for Healthcare Providers:  SeriousBroker.it  This test is no t yet approved or cleared by the [place] FDA and  has been authorized for detection and/or diagnosis of SARS-CoV-2 by FDA under an Emergency Use Authorization (EUA). This EUA will remain  in effect (meaning this test can be used) for the duration of the COVID-19 declaration under Section 564(b)(1) of the Act, 21 U.S.C.section 360bbb-3(b)(1), unless the authorization is terminated  or revoked sooner.       Influenza A by PCR NEGATIVE NEGATIVE Final   Influenza B by PCR NEGATIVE NEGATIVE Final    Comment: (NOTE) The Xpert Xpress SARS-CoV-2/FLU/RSV plus assay is intended as an aid in the diagnosis of influenza from Nasopharyngeal swab specimens and should not be used as a sole basis for treatment. Nasal washings and aspirates are unacceptable for Xpert Xpress SARS-CoV-2/FLU/RSV testing.  Fact Sheet for Patients: BloggerCourse.com  Fact Sheet for Healthcare Providers: SeriousBroker.it  This test is not yet approved or cleared by the [place] FDA and has been authorized for detection and/or diagnosis of SARS-CoV-2 by FDA under an Emergency Use Authorization (EUA). This EUA will remain in effect (meaning this test can be used) for the duration of  the COVID-19 declaration under Section 564(b)(1) of the Act, 21 U.S.C. section 360bbb-3(b)(1), unless the authorization is terminated or revoked.     Resp Syncytial Virus by PCR NEGATIVE NEGATIVE Final    Comment: (NOTE) Fact Sheet for Patients: BloggerCourse.com  Fact Sheet for Healthcare Providers: SeriousBroker.it  This test is not yet approved or cleared by the [place] FDA and has been authorized for detection and/or diagnosis of SARS-CoV-2 by FDA under an Emergency Use Authorization (EUA). This EUA will remain in effect (meaning this test can be used) for the duration of the COVID-19 declaration under Section 564(b)(1) of the Act, 21 U.S.C. section 360bbb-3(b)(1), unless the authorization is terminated or revoked.  Performed at [facility], [address] [place], [place] [address]   MRSA Next Gen by PCR, Nasal     Status: Abnormal   Collection Time: 02/13/23  9:05 PM   Specimen: Nasal Mucosa; Nasal Swab  Result Value Ref Range Status   MRSA by PCR Next Gen DETECTED (A) NOT DETECTED Final    Comment: CRITICAL RESULT CALLED TO, READ BACK BY AND VERIFIED WITH: Mosetta Putt RN @ 2300 02/13/23 [facility] (NOTE) The GeneXpert MRSA Assay (FDA approved for NASAL specimens only), is one component of a comprehensive MRSA colonization surveillance program. It is not intended to diagnose MRSA infection nor to guide or monitor treatment for MRSA infections. Test performance is not FDA approved in patients less than 74 years old. Performed at [facility], [address] [place], [place] [address]   MRSA Next Gen by PCR, Nasal     Status: Abnormal   Collection Time: 02/14/23  1:15 PM   Specimen: Nasal Mucosa; Nasal Swab  Result Value Ref Range Status  MRSA by PCR Next Gen DETECTED (A) NOT DETECTED Final    Comment: RESULT CALLED TO, READ BACK BY AND VERIFIED WITH: BRITTNEY MANSFIELD AT 1614  02/14/23.PM[facility] (NOTE) The GeneXpert MRSA Assay (FDA approved for NASAL specimens only), is one component of a comprehensive MRSA colonization surveillance program. It is not intended to diagnose MRSA infection nor to guide or monitor treatment for MRSA infections. Test performance is not FDA approved in patients less than 53 years old. Performed at [facility], [address], [place], [place] [address]   Aerobic Culture w Gram Stain (superficial specimen)     Status: None   Collection Time: 02/14/23  4:55 PM   Specimen: Catheter Tip  Result Value Ref Range Status   Specimen Description   Final    CATH TIP Performed at [facility], [address], [place], [place] [address]    Special Requests   Final    NONE Performed at [facility], [address] [place], [place] [address]    Gram Stain   Final    RARE WBC PRESENT, PREDOMINANTLY PMN NO ORGANISMS SEEN    Culture   Final    NO GROWTH 2 DAYS Performed at [facility], [address] [place], [place] [address]    Report Status 02/17/2023 FINAL  Final  Anaerobic culture w Gram Stain     Status: None (Preliminary result)   Collection Time: 02/14/23  4:55 PM   Specimen: Catheter Tip  Result Value Ref Range Status   Specimen Description   Final    CATH TIP Performed at [facility], [address] [place], [place] [address]    Special Requests   Final    NONE Performed at [facility], [address] [place], [place] [address]    Gram Stain   Final    RARE WBC PRESENT, PREDOMINANTLY PMN NO ORGANISMS SEEN Performed at [facility], [address] [place], [place] [address]    Culture   Final    NO ANAEROBES ISOLATED; CULTURE IN PROGRESS FOR 5 DAYS   Report Status PENDING  Incomplete         Radiology Studies: PERIPHERAL VASCULAR CATHETERIZATION  Result Date: 02/16/2023 See surgical note for result.       Scheduled Meds:  atorvastatin  20 mg  Per Tube Daily   budesonide (PULMICORT) nebulizer solution  0.25 mg Nebulization BID   Chlorhexidine Gluconate Cloth  6 each Topical Q0600   cholestyramine  4 g Per Tube TID   escitalopram  10 mg Per Tube Daily   feeding supplement (PROSource TF20)  60 mL Per Tube Daily   free water  30 mL Per Tube Q4H   heparin  5,000 Units Subcutaneous Q12H   hydrocortisone sod succinate (SOLU-CORTEF) inj  100 mg Intravenous Q8H   hydrOXYzine  100 mg Oral BID   ipratropium-albuterol  3 mL Nebulization BID   midodrine  20 mg Oral TID WC   multivitamin  1 tablet Per Tube QHS   mupirocin ointment  1 Application Nasal BID   nutrition supplement (JUVEN)  1 packet Per Tube BID BM   mouth rinse  15 mL Mouth Rinse 4 times per day   pantoprazole (PROTONIX) IV  40 mg Intravenous Q24H   vancomycin variable dose per unstable renal function (pharmacist dosing)   Does not apply See admin instructions   Continuous Infusions:  feeding supplement (NEPRO CARB STEADY) 45 mL/hr at 02/17/23 1300  piperacillin-tazobactam (ZOSYN)  IV Stopped (02/17/23 0704)   vancomycin       LOS: 4 days       Tresa Moore, MD [facility]   If 7PM-7AM, please contact night-coverage  02/17/2023, 3:17 PM

## 2023-02-18 DIAGNOSIS — R6520 Severe sepsis without septic shock: Secondary | ICD-10-CM | POA: Diagnosis not present

## 2023-02-18 DIAGNOSIS — A419 Sepsis, unspecified organism: Secondary | ICD-10-CM | POA: Diagnosis not present

## 2023-02-18 DIAGNOSIS — G9341 Metabolic encephalopathy: Secondary | ICD-10-CM | POA: Diagnosis not present

## 2023-02-18 LAB — CBC WITH DIFFERENTIAL/PLATELET
Abs Immature Granulocytes: 0.14 10*3/uL — ABNORMAL HIGH (ref 0.00–0.07)
Basophils Absolute: 0 10*3/uL (ref 0.0–0.1)
Basophils Relative: 0 %
Eosinophils Absolute: 0 10*3/uL (ref 0.0–0.5)
Eosinophils Relative: 0 %
HCT: 34.1 % — ABNORMAL LOW (ref 36.0–46.0)
Hemoglobin: 11.1 g/dL — ABNORMAL LOW (ref 12.0–15.0)
Immature Granulocytes: 1 %
Lymphocytes Relative: 7 %
Lymphs Abs: 0.8 10*3/uL (ref 0.7–4.0)
MCH: 26.2 pg (ref 26.0–34.0)
MCHC: 32.6 g/dL (ref 30.0–36.0)
MCV: 80.4 fL (ref 80.0–100.0)
Monocytes Absolute: 0.4 10*3/uL (ref 0.1–1.0)
Monocytes Relative: 3 %
Neutro Abs: 10.8 10*3/uL — ABNORMAL HIGH (ref 1.7–7.7)
Neutrophils Relative %: 89 %
Platelets: 303 10*3/uL (ref 150–400)
RBC: 4.24 MIL/uL (ref 3.87–5.11)
RDW: 14.6 % (ref 11.5–15.5)
WBC: 12.1 10*3/uL — ABNORMAL HIGH (ref 4.0–10.5)
nRBC: 0.2 % (ref 0.0–0.2)

## 2023-02-18 LAB — CULTURE, BLOOD (ROUTINE X 2)
Culture: NO GROWTH
Culture: NO GROWTH
Special Requests: ADEQUATE
Special Requests: ADEQUATE

## 2023-02-18 LAB — COMPREHENSIVE METABOLIC PANEL
ALT: 194 U/L — ABNORMAL HIGH (ref 0–44)
AST: 155 U/L — ABNORMAL HIGH (ref 15–41)
Albumin: 2.7 g/dL — ABNORMAL LOW (ref 3.5–5.0)
Alkaline Phosphatase: 160 U/L — ABNORMAL HIGH (ref 38–126)
Anion gap: 8 (ref 5–15)
BUN: 37 mg/dL — ABNORMAL HIGH (ref 8–23)
CO2: 27 mmol/L (ref 22–32)
Calcium: 8.5 mg/dL — ABNORMAL LOW (ref 8.9–10.3)
Chloride: 100 mmol/L (ref 98–111)
Creatinine, Ser: 2.3 mg/dL — ABNORMAL HIGH (ref 0.44–1.00)
GFR, Estimated: 22 mL/min — ABNORMAL LOW (ref >60)
Glucose, Bld: 153 mg/dL — ABNORMAL HIGH (ref 70–99)
Potassium: 3.5 mmol/L (ref 3.5–5.1)
Sodium: 135 mmol/L (ref 135–145)
Total Bilirubin: 0.8 mg/dL (ref <1.2)
Total Protein: 6.6 g/dL (ref 6.5–8.1)

## 2023-02-18 LAB — GLUCOSE, CAPILLARY
Glucose-Capillary: 161 mg/dL — ABNORMAL HIGH (ref 70–99)
Glucose-Capillary: 115 mg/dL — ABNORMAL HIGH (ref 70–99)
Glucose-Capillary: 170 mg/dL — ABNORMAL HIGH (ref 70–99)
Glucose-Capillary: 139 mg/dL — ABNORMAL HIGH (ref 70–99)
Glucose-Capillary: 134 mg/dL — ABNORMAL HIGH (ref 70–99)
Glucose-Capillary: 125 mg/dL — ABNORMAL HIGH (ref 70–99)

## 2023-02-18 LAB — PHOSPHORUS: Phosphorus: 2.4 mg/dL — ABNORMAL LOW (ref 2.5–4.6)

## 2023-02-18 LAB — MAGNESIUM: Magnesium: 2 mg/dL (ref 1.7–2.4)

## 2023-02-18 MED ORDER — MIDODRINE HCL 5 MG PO TABS
20.0000 mg | ORAL_TABLET | Freq: Three times a day (TID) | ORAL | Status: DC
  Administered 2023-02-18 – 2023-02-20 (×7): 20 mg
  Filled 2023-02-18 (×7): qty 4

## 2023-02-18 MED ORDER — PANTOPRAZOLE SODIUM 40 MG PO TBEC: 40.0000 mg | DELAYED_RELEASE_TABLET | Freq: Every day | ORAL | Status: DC

## 2023-02-18 MED ORDER — HYDROXYZINE HCL 50 MG PO TABS
100.0000 mg | ORAL_TABLET | Freq: Two times a day (BID) | ORAL | Status: DC
  Administered 2023-02-18 – 2023-02-20 (×4): 100 mg
  Filled 2023-02-18 (×5): qty 2

## 2023-02-18 MED ORDER — VANCOMYCIN HCL IN DEXTROSE 1-5 GM/200ML-% IV SOLN
1000.0000 mg | INTRAVENOUS | Status: DC
  Administered 2023-02-19: 1000 mg via INTRAVENOUS
  Filled 2023-02-18 (×2): qty 200

## 2023-02-18 MED ORDER — PANTOPRAZOLE SODIUM 40 MG IV SOLR
40.0000 mg | Freq: Every day | INTRAVENOUS | Status: DC
  Administered 2023-02-18 – 2023-02-19 (×2): 40 mg via INTRAVENOUS
  Filled 2023-02-18 (×2): qty 10

## 2023-02-18 NOTE — Plan of Care (Signed)
  Problem: Clinical Measurements: Goal: Ability to maintain clinical measurements within normal limits will improve Outcome: Progressing Goal: Will remain free from infection Outcome: Progressing Goal: Respiratory complications will improve Outcome: Progressing Goal: Cardiovascular complication will be avoided Outcome: Progressing   Problem: Education: Goal: Knowledge of General Education information will improve Description: Including pain rating scale, medication(s)/side effects and non-pharmacologic comfort measures Outcome: Not Progressing   Problem: Health Behavior/Discharge Planning: Goal: Ability to manage health-related needs will improve Outcome: Not Progressing   Problem: Activity: Goal: Risk for activity intolerance will decrease Outcome: Not Progressing   Problem: Coping: Goal: Level of anxiety will decrease Outcome: Not Progressing

## 2023-02-18 NOTE — Discharge Planning (Signed)
ESTABLISHED HEMPDIALYSIS  Outpatient Facility  [facility]  [address]  [place] [address] [phone]  Schedule: TTS 11:20AM  Patient is active and can resume upon discharge.   Dimas Chyle Dialysis Coordinator II  [facility] Cell: [phone] eFax: [phone] [email or web address]

## 2023-02-18 NOTE — Progress Notes (Signed)
[facility]  PROGRESS NOTE   Subjective:   Patient seen laying in bed Alert and oriented to self Denies pain or discomfort at this time  Scheduled for dialysis tomorrow  Objective:  Vital signs: Blood pressure 120/63, pulse (!) 55, temperature 98.5 F (36.9 C), resp. rate 16, height 5\' 4"  (1.626 m), weight 85.1 kg, SpO2 100%.  Intake/Output Summary (Last 24 hours) at 02/18/2023 1650 Last data filed at 02/18/2023 0912 Gross per 24 hour  Intake 1114 ml  Output 1201 ml  Net -87 ml   Filed Weights   02/17/23 1320 02/17/23 1722 02/18/23 0500  Weight: 86.3 kg 85.4 kg 85.1 kg     Physical Exam: General:  No acute distress  Head:  Normocephalic, atraumatic. Moist oral mucosal membranes  Eyes:  Anicteric  Lungs:   Clear to auscultation, normal effort  Heart:  S1S2 no rubs  Abdomen:   Soft, nontender, bowel sounds present  Extremities:  Trace peripheral edema.  Neurologic:  Awake, alert, following commands  Skin:  No lesions  Access: Rt chest permcath    Basic Metabolic Panel: Recent Labs  Lab 02/13/23 0047 02/14/23 0500 02/15/23 0424 02/15/23 1344 02/15/23 1651 02/16/23 0414 02/16/23 1724 02/17/23 0437 02/18/23 0410  NA  --  139 138  --   --  136  --  137 135  K  --  3.9 4.5  --   --  4.2  --  4.2 3.5  CL  --  106 103  --   --  103  --  107 100  CO2  --  16* 25  --   --  20*  --  19* 27  GLUCOSE  --  174* 155*  --   --  114*  --  144* 153*  BUN  --  58* 72*  --   --  78*  --  85* 37*  CREATININE  --  3.94* 4.65*  --   --  4.70*  --  4.97* 2.30*  CALCIUM  --  8.4* 8.7*  --   --  8.8*  --  9.0 8.5*  MG  --   --   --  2.1 2.0 2.2  --  2.4 2.0  PHOS   < >  --   --  4.6 5.2* 5.2* 5.0* 5.4* 2.4*   < > = values in this interval not displayed.   GFR: Estimated Creatinine Clearance: 22.7 mL/min (A) (by C-G formula based on SCr of 2.3 mg/dL (H)).  Liver Function Tests: Recent Labs  Lab 02/14/23 0500 02/15/23 0424 02/16/23 0414 02/17/23 0437  02/18/23 0410  AST 127* 99* 144* 155* 155*  ALT 133* 142* 164* 197* 194*  ALKPHOS 112 90 116 131* 160*  BILITOT 0.9 0.8 0.8 0.7 0.8  PROT 5.8* 6.5 6.4* 6.3* 6.6  ALBUMIN 2.0* 2.2* 2.2* 2.3* 2.7*   No results for input(s): "LIPASE", "AMYLASE" in the last 168 hours. Recent Labs  Lab 02/13/23 0047  AMMONIA 23    CBC: Recent Labs  Lab 02/13/23 0629 02/14/23 0500 02/15/23 0424 02/16/23 0414 02/17/23 0437 02/18/23 0410  WBC 12.6* 32.9* 31.2* 26.3* 17.1* 12.1*  NEUTROABS 6.7  --  29.5* 25.0* 15.8* 10.8*  HGB 13.1 13.4 12.1 11.3* 10.7* 11.1*  HCT 43.5 42.9 38.4 34.4* 33.0* 34.1*  MCV 85.8 85.0 83.3 78.7* 79.5* 80.4  PLT 297 284 295 351 353 303     HbA1C: Hgb A1c MFr Bld  Date/Time Value Ref Range Status  02/11/2023 02:55 PM 4.7 <5.7 % of total Hgb Final    Comment:    For the purpose of screening for the presence of diabetes: . <5.7%       Consistent with the absence of diabetes 5.7-6.4%    Consistent with increased risk for diabetes             (prediabetes) > or =6.5%  Consistent with diabetes . This assay result is consistent with a decreased risk of diabetes. . Currently, no consensus exists regarding use of hemoglobin A1c for diagnosis of diabetes in children. . According to American Diabetes Association (ADA) guidelines, hemoglobin A1c <7.0% represents optimal control in non-pregnant diabetic patients. Different metrics may apply to specific patient populations.  Standards of Medical Care in Diabetes(ADA). [name]   12/07/2022 02:13 AM 5.2 4.8 - 5.6 % Final    Comment:    (NOTE) Pre diabetes:          5.7%-6.4%  Diabetes:              >6.4%  Glycemic control for   <7.0% adults with diabetes     Urinalysis: No results for input(s): "COLORURINE", "LABSPEC", "PHURINE", "GLUCOSEU", "HGBUR", "BILIRUBINUR", "KETONESUR", "PROTEINUR", "UROBILINOGEN", "NITRITE", "LEUKOCYTESUR" in the last 72 hours.  Invalid input(s): "APPERANCEUR"     Imaging: No results  found.   Medications:    feeding supplement (NEPRO CARB STEADY) 1,000 mL (02/18/23 1226)   piperacillin-tazobactam (ZOSYN)  IV 2.25 g (02/18/23 1058)   [START ON 02/19/2023] vancomycin      atorvastatin  20 mg Per Tube Daily   budesonide (PULMICORT) nebulizer solution  0.25 mg Nebulization BID   Chlorhexidine Gluconate Cloth  6 each Topical Q0600   cholestyramine  4 g Per Tube TID   escitalopram  10 mg Per Tube Daily   feeding supplement (PROSource TF20)  60 mL Per Tube Daily   free water  30 mL Per Tube Q4H   heparin  5,000 Units Subcutaneous Q12H   hydrOXYzine  100 mg Per Tube BID   ipratropium-albuterol  3 mL Nebulization BID   midodrine  20 mg Per Tube TID WC   multivitamin  1 tablet Per Tube QHS   mupirocin ointment  1 Application Nasal BID   nutrition supplement (JUVEN)  1 packet Per Tube BID BM   mouth rinse  15 mL Mouth Rinse 4 times per day   pantoprazole (PROTONIX) IV  40 mg Intravenous QHS    Assessment/ Plan:     72 year old female with history of hypertension, coronary artery disease, congestive heart failure, COPD, cirrhosis, diabetes, status post left AKA, status post G-tube placement.  Patient is now being admitted for altered mental status.  Her last dialysis treatment was on Thursday.  She is also found to be septic.  The permacath was removed.    #1: End-stage renal disease: Last treatment was on Thursday.  PermCath has been removed.  Source of infection is not clear.  Catheter tip cultures are negative so far.  Currently receiving empiric vancomycin and Zosyn. Next treatment scheduled on Thursday.    #2: Hypotension:   Receiving midodrine.  Blood pressure has remained stable.   #3: Sepsis: Blood cultures negative.  Continue IV Zosyn and vancomycin     #4: Secondary hyperparathyroidism: Calcium 8.5 with phosphorus 2.4.    LOS: 5 [facility] 11/6/20244:50 PM

## 2023-02-18 NOTE — Progress Notes (Signed)
Telephone report called to Boubarcar Diallo,RN. Patient taken to room 135 via bed.

## 2023-02-18 NOTE — Progress Notes (Signed)
PROGRESS NOTE    Alexandra Foster  FAO:[id number] DOB: 06-16-1950 DOA: 02/13/2023 PCP: [name], MD  135A/135A-AA  LOS: 5 days   Brief hospital course:   Assessment & Plan:  72 y.o. female with medical history significant of ESRD on HD TTS, via a left chest PermCath, asthma/COPD, severe dysphagia status post G-tube, IIDM, HTN, left AKA, PAF, OSA on CPAP, cirrhosis, adrenal insufficiency on Cortef, brought in by family member for evaluation of sepsis.   Patient is confused, or history provided by daughter over the phone and patient's sister at bedside.  Patient started to have altered mentations yesterday and threw up x 1 during dialysis.  Overnight, patient became more confused.  According to daughter, patient's G-tube insertion site has a possible infected as she has been seeing more yellow-greenish pus coming out from around the G-tube entrance and she had to change the gauze of the G-tube 2 times a day to clean the past.  However patient does have a visiting wound care nurse seeing her 2 weeks sick day and according to the wound care team, the G-tube site was not infected.  In addition, daughter also reported that for the last 2 HD sessions, the outpatient HD team reported that the PermCath on left chest " has some narrowing and blood flow very slow" and patient was scheduled to have PermCath exchanged next week.  Patient denied any fever chills no diarrhea no abdominal pain or chest pain.  Septic shock, improved Patient has had low blood pressure despite multiple fluid boluses.  Sepsis criteria met with leukocytosis, tachycardia, fever.  Potential sources include chest dialysis catheter versus gastric tube.  PEG tube was evaluated for septic focus by general surgery.  Do not feel that PEG tube is infected.  Higher suspicion that HD catheter site may be infected. --was on pressor, since weaned off.  started on oral midodrine and IV stress dose steroids --Patient was given a line  holiday for 48 hours after removal of HD PermCath.  PermCath now back in place in right chest Plan: --cont midodrine --taper down stress dose steroid --d/c zosyn --cont vanc   Acute metabolic encephalopathy Likely secondary to sepsis.  Now resolved.   Acute transaminitis Downtrending, likely related to sepsis   ESRD on HD with access difficulties Case discussed with on-call nephrology.  Patient was last dialyzed Thursday 10/31.  HD catheter was removed by vascular surgery Saturday 11/2.  Vascular surgery reengaged and will replace vascular access 11/4. --iHD per nephro   PAF Patient not on anticoagulation for unclear reasons.   Amiodarone on hold given resting bradycardia   G-tube dependent Severe dysphagia General Surgery consulted  Patient will remain n.p.o. as she has severe dysphagia.  All medications via tube.   Tube feeds restarted 11/3 --cont tube feed   Chronic adrenal insufficiency Midodrine 20 mg 3 times daily.   Home Solu-Cortef on hold --taper down stress dose steroid   OSA CPAP nightly  COPD --cont Pulmicort and DuoNeb   DVT prophylaxis: Heparin SQ Code Status: Full code  Family Communication: husband updated at bedside today Level of care: Med-Surg Dispo:   The patient is from: home Anticipated d/c is to: home Anticipated d/c date is: 1-2 days   Subjective and Interval History:  Pt has been having frequent loose stools causing irritation.  Reported intermittent abdominal pain.  Still making urine, but no dysuria PTA.   Objective: Vitals:   02/18/23 0800 02/18/23 0900 02/18/23 1200 02/18/23 1648  BP: (!) 96/48  117/63 138/68 120/63  Pulse: (!) 57 (!) 54 (!) 58 (!) 55  Resp:   18 16  Temp:   98.4 F (36.9 C) 98.5 F (36.9 C)  TempSrc:   Tympanic   SpO2: 95% 95% 92% 100%  Weight:      Height:        Intake/Output Summary (Last 24 hours) at 02/18/2023 1801 Last data filed at 02/18/2023 0912 Gross per 24 hour  Intake 1114 ml  Output 201  ml  Net 913 ml   Filed Weights   02/17/23 1320 02/17/23 1722 02/18/23 0500  Weight: 86.3 kg 85.4 kg 85.1 kg    Examination:   Constitutional: NAD, AAOx3 HEENT: conjunctivae and lids normal, EOMI CV: No cyanosis.   RESP: normal respiratory effort, on RA Extremities: edema in RLE SKIN: warm, dry, peeling in both upper arms Psych: depressed mood and affect.     Data Reviewed: I have personally reviewed labs and imaging studies  Time spent: 50 minutes  [name], MD [facility] If 7PM-7AM, please contact night-coverage 02/18/2023, 6:01 PM

## 2023-02-19 DIAGNOSIS — G9341 Metabolic encephalopathy: Secondary | ICD-10-CM | POA: Diagnosis not present

## 2023-02-19 DIAGNOSIS — R6520 Severe sepsis without septic shock: Secondary | ICD-10-CM | POA: Diagnosis not present

## 2023-02-19 DIAGNOSIS — A419 Sepsis, unspecified organism: Secondary | ICD-10-CM | POA: Diagnosis not present

## 2023-02-19 LAB — RENAL FUNCTION PANEL
Albumin: 2.4 g/dL — ABNORMAL LOW (ref 3.5–5.0)
Anion gap: 9 (ref 5–15)
BUN: 76 mg/dL — ABNORMAL HIGH (ref 8–23)
CO2: 26 mmol/L (ref 22–32)
Calcium: 8.5 mg/dL — ABNORMAL LOW (ref 8.9–10.3)
Chloride: 99 mmol/L (ref 98–111)
Creatinine, Ser: 2.98 mg/dL — ABNORMAL HIGH (ref 0.44–1.00)
GFR, Estimated: 16 mL/min — ABNORMAL LOW (ref >60)
Glucose, Bld: 110 mg/dL — ABNORMAL HIGH (ref 70–99)
Phosphorus: 2.9 mg/dL (ref 2.5–4.6)
Potassium: 2.8 mmol/L — ABNORMAL LOW (ref 3.5–5.1)
Sodium: 134 mmol/L — ABNORMAL LOW (ref 135–145)

## 2023-02-19 LAB — CBC
HCT: 33.1 % — ABNORMAL LOW (ref 36.0–46.0)
Hemoglobin: 10.8 g/dL — ABNORMAL LOW (ref 12.0–15.0)
MCH: 25.8 pg — ABNORMAL LOW (ref 26.0–34.0)
MCHC: 32.6 g/dL (ref 30.0–36.0)
MCV: 79 fL — ABNORMAL LOW (ref 80.0–100.0)
Platelets: 287 10*3/uL (ref 150–400)
RBC: 4.19 MIL/uL (ref 3.87–5.11)
RDW: 14.7 % (ref 11.5–15.5)
WBC: 14.5 10*3/uL — ABNORMAL HIGH (ref 4.0–10.5)
nRBC: 0.2 % (ref 0.0–0.2)

## 2023-02-19 LAB — GLUCOSE, CAPILLARY
Glucose-Capillary: 104 mg/dL — ABNORMAL HIGH (ref 70–99)
Glucose-Capillary: 104 mg/dL — ABNORMAL HIGH (ref 70–99)

## 2023-02-19 LAB — COPPER, SERUM: Copper: 84 ug/dL (ref 80–158)

## 2023-02-19 MED ORDER — HEPARIN SODIUM (PORCINE) 1000 UNIT/ML IJ SOLN
INTRAMUSCULAR | Status: AC
  Filled 2023-02-19: qty 10

## 2023-02-19 MED ORDER — DIPHENOXYLATE-ATROPINE 2.5-0.025 MG PO TABS: 1.0000 | ORAL_TABLET | Freq: Three times a day (TID) | ORAL | Status: DC | PRN

## 2023-02-19 MED ORDER — ALTEPLASE 2 MG IJ SOLR: 2.0000 mg | Freq: Once | INTRAMUSCULAR | Status: DC | PRN

## 2023-02-19 MED ORDER — FLUDROCORTISONE ACETATE 0.1 MG PO TABS
0.2000 mg | ORAL_TABLET | Freq: Every day | ORAL | Status: DC
  Administered 2023-02-19 – 2023-02-20 (×2): 0.2 mg
  Filled 2023-02-19 (×2): qty 2

## 2023-02-19 MED ORDER — ALBUMIN HUMAN 25 % IV SOLN
25.0000 g | Freq: Once | INTRAVENOUS | Status: AC
  Administered 2023-02-19: 25 g via INTRAVENOUS

## 2023-02-19 MED ORDER — IPRATROPIUM-ALBUTEROL 0.5-2.5 (3) MG/3ML IN SOLN: 3.0000 mL | Freq: Four times a day (QID) | RESPIRATORY_TRACT | Status: DC | PRN

## 2023-02-19 MED ORDER — ALBUMIN HUMAN 25 % IV SOLN
INTRAVENOUS | Status: AC
  Filled 2023-02-19: qty 100

## 2023-02-19 NOTE — Progress Notes (Signed)
PROGRESS NOTE    Alexandra Foster  ZOX:[id number] DOB: 12-Jun-1950 DOA: 02/13/2023 PCP: [name], MD  135A/135A-AA  LOS: 6 days   Brief hospital course:   Assessment & Plan:  72 y.o. female with medical history significant of ESRD on HD TTS, via a left chest PermCath, asthma/COPD, severe dysphagia status post G-tube, IIDM, HTN, left AKA, PAF, OSA on CPAP, cirrhosis, adrenal insufficiency on Cortef, brought in by family member for evaluation of sepsis.   Patient is confused, or history provided by daughter over the phone and patient's sister at bedside.  Patient started to have altered mentations yesterday and threw up x 1 during dialysis.  Overnight, patient became more confused.  According to daughter, patient's G-tube insertion site has a possible infected as she has been seeing more yellow-greenish pus coming out from around the G-tube entrance and she had to change the gauze of the G-tube 2 times a day to clean the past.  However patient does have a visiting wound care nurse seeing her 2 weeks sick day and according to the wound care team, the G-tube site was not infected.  In addition, daughter also reported that for the last 2 HD sessions, the outpatient HD team reported that the PermCath on left chest " has some narrowing and blood flow very slow" and patient was scheduled to have PermCath exchanged next week.  Patient denied any fever chills no diarrhea no abdominal pain or chest pain.  Septic shock, improved Patient has had low blood pressure despite multiple fluid boluses.  Sepsis criteria met with leukocytosis, tachycardia, fever.  Potential sources include chest dialysis catheter versus gastric tube.  PEG tube was evaluated for septic focus by general surgery.  Do not feel that PEG tube is infected.  Higher suspicion that HD catheter site may be infected. --was on pressor, since weaned off.  started on oral midodrine and IV stress dose steroids --Patient was given a line  holiday for 48 hours after removal of HD PermCath.  PermCath now back in place in right chest --zosyn d/c'ed. Plan: --cont midodrine --taper down stress dose steroid --cont Vanc for 10 days, starting from new PermCath insertion date.   Acute metabolic encephalopathy Likely secondary to sepsis.  Now resolved.   Acute transaminitis Downtrending, likely related to sepsis   ESRD on HD with access difficulties Case discussed with on-call nephrology.  Patient was last dialyzed Thursday 10/31.  HD catheter was removed by vascular surgery Saturday 11/2.  Vascular surgery reengaged and will replace vascular access 11/4. --iHD per nephro   PAF Patient not on anticoagulation for unclear reasons.   --hold amiodarone given bradycardia   G-tube dependent Severe dysphagia General Surgery consulted  Patient will remain n.p.o. as she has severe dysphagia.  All medications via tube.   Tube feeds restarted 11/3 --cont tube feed   Chronic adrenal insufficiency Midodrine 20 mg 3 times daily.   --tapered down stress dose steroid --resume home Florinef today   OSA CPAP nightly  COPD --cont Pulmicort and DuoNeb  Hypokalemia --correct with HD  Diarrhea --per husband, started after presentation.  Likely due to abx use and tube feeds.  No fever, leukocytosis improving, no strong evidence of C diff. --Lomotil PRN    DVT prophylaxis: Heparin SQ Code Status: Full code  Family Communication: daughter updated on the phone today Level of care: Med-Surg Dispo:   The patient is from: home Anticipated d/c is to: home Anticipated d/c date is: 1-2 days   Subjective and  Interval History:  Pt received lomotil yesterday.  Diarrhea improved today.   Objective: Vitals:   02/19/23 1200 02/19/23 1230 02/19/23 1237 02/19/23 1555  BP: 97/61 (!) 86/55 104/62 113/61  Pulse: (!) 52 (!) 54 (!) 56 (!) 55  Resp: 17 17 17 17   Temp:   98.7 F (37.1 C) 98.2 F (36.8 C)  TempSrc:   Oral   SpO2: 97%  99% 98% 100%  Weight:   86.4 kg   Height:        Intake/Output Summary (Last 24 hours) at 02/19/2023 1722 Last data filed at 02/19/2023 1500 Gross per 24 hour  Intake 1386 ml  Output 900 ml  Net 486 ml   Filed Weights   02/19/23 0500 02/19/23 0821 02/19/23 1237  Weight: 86.3 kg 87.5 kg 86.4 kg    Examination:   Constitutional: NAD, alert HEENT: conjunctivae and lids normal, EOMI CV: No cyanosis.   RESP: normal respiratory effort, on RA Psych: Normal mood and affect.     Data Reviewed: I have personally reviewed labs and imaging studies  Time spent: 35 minutes  [name], MD [facility] If 7PM-7AM, please contact night-coverage 02/19/2023, 5:22 PM

## 2023-02-19 NOTE — Progress Notes (Signed)
Hemodialysis note  Received patient in bed to unit. Alert and oriented x 2.  Informed consent signed and in chart.  Treatment initiated: 1610 Treatment completed: 1237  Patient tolerated well. Transported back to room, alert without acute distress.  Report given to patient's RN.   Access used: Right Chest HD cath Access issues: High arterial pressure  Total UF removed: 900 ml Medication(s) given:  Albumin 25G IV and Vancomycin IV  Post HD weight: 86.4 kg   Wolfgang Phoenix [facility]

## 2023-02-19 NOTE — Progress Notes (Addendum)
[facility]  PROGRESS NOTE   Subjective:   Patient seen and evaluated during dialysis   HEMODIALYSIS FLOWSHEET:  Blood Flow Rate (mL/min): 350 mL/min Arterial Pressure (mmHg): -200 mmHg Venous Pressure (mmHg): 150 mmHg TMP (mmHg): 11 mmHg Ultrafiltration Rate (mL/min): 1071 mL/min Dialysate Flow Rate (mL/min): 300 ml/min Dialysis Fluid Bolus: Normal Saline Bolus Amount (mL): 100 mL  Complaining of hip pain, awaiting medications  Objective:  Vital signs: Blood pressure (!) 92/58, pulse (!) 58, temperature 98.3 F (36.8 C), resp. rate 16, height 5\' 4"  (1.626 m), weight 87.5 kg, SpO2 97%. No intake or output data in the 24 hours ending 02/19/23 1124  Filed Weights   02/18/23 0500 02/19/23 0500 02/19/23 0821  Weight: 85.1 kg 86.3 kg 87.5 kg     Physical Exam: General:  No acute distress  Head:  Normocephalic, atraumatic. Moist oral mucosal membranes  Eyes:  Anicteric  Lungs:   Clear to auscultation, normal effort  Heart:  S1S2 no rubs  Abdomen:   Soft, nontender, bowel sounds present  Extremities:  Trace peripheral edema.  Neurologic:  Awake, alert, following commands  Skin:  No lesions  Access: Rt chest permcath/Dr. Dew/02/16/2023    Basic Metabolic Panel: Recent Labs  Lab 02/13/23 0047 02/15/23 0424 02/15/23 1344 02/15/23 1651 02/16/23 0414 02/16/23 1724 02/17/23 0437 02/18/23 0410 02/19/23 0836  NA  --  138  --   --  136  --  137 135 134*  K  --  4.5  --   --  4.2  --  4.2 3.5 2.8*  CL  --  103  --   --  103  --  107 100 99  CO2  --  25  --   --  20*  --  19* 27 26  GLUCOSE  --  155*  --   --  114*  --  144* 153* 110*  BUN  --  72*  --   --  78*  --  85* 37* 76*  CREATININE  --  4.65*  --   --  4.70*  --  4.97* 2.30* 2.98*  CALCIUM  --  8.7*  --   --  8.8*  --  9.0 8.5* 8.5*  MG  --   --  2.1 2.0 2.2  --  2.4 2.0  --   PHOS   < >  --  4.6 5.2* 5.2* 5.0* 5.4* 2.4* 2.9   < > = values in this interval not displayed.   GFR: Estimated  Creatinine Clearance: 17.7 mL/min (A) (by C-G formula based on SCr of 2.98 mg/dL (H)).  Liver Function Tests: Recent Labs  Lab 02/14/23 0500 02/15/23 0424 02/16/23 0414 02/17/23 0437 02/18/23 0410 02/19/23 0836  AST 127* 99* 144* 155* 155*  --   ALT 133* 142* 164* 197* 194*  --   ALKPHOS 112 90 116 131* 160*  --   BILITOT 0.9 0.8 0.8 0.7 0.8  --   PROT 5.8* 6.5 6.4* 6.3* 6.6  --   ALBUMIN 2.0* 2.2* 2.2* 2.3* 2.7* 2.4*   No results for input(s): "LIPASE", "AMYLASE" in the last 168 hours. Recent Labs  Lab 02/13/23 0047  AMMONIA 23    CBC: Recent Labs  Lab 02/13/23 0629 02/14/23 0500 02/15/23 0424 02/16/23 0414 02/17/23 0437 02/18/23 0410  WBC 12.6* 32.9* 31.2* 26.3* 17.1* 12.1*  NEUTROABS 6.7  --  29.5* 25.0* 15.8* 10.8*  HGB 13.1 13.4 12.1 11.3* 10.7* 11.1*  HCT 43.5 42.9 38.4  34.4* 33.0* 34.1*  MCV 85.8 85.0 83.3 78.7* 79.5* 80.4  PLT 297 284 295 351 353 303     HbA1C: Hgb A1c MFr Bld  Date/Time Value Ref Range Status  02/11/2023 02:55 PM 4.7 <5.7 % of total Hgb Final    Comment:    For the purpose of screening for the presence of diabetes: . <5.7%       Consistent with the absence of diabetes 5.7-6.4%    Consistent with increased risk for diabetes             (prediabetes) > or =6.5%  Consistent with diabetes . This assay result is consistent with a decreased risk of diabetes. . Currently, no consensus exists regarding use of hemoglobin A1c for diagnosis of diabetes in children. . According to American Diabetes Association (ADA) guidelines, hemoglobin A1c <7.0% represents optimal control in non-pregnant diabetic patients. Different metrics may apply to specific patient populations.  Standards of Medical Care in Diabetes(ADA). [name]   12/07/2022 02:13 AM 5.2 4.8 - 5.6 % Final    Comment:    (NOTE) Pre diabetes:          5.7%-6.4%  Diabetes:              >6.4%  Glycemic control for   <7.0% adults with diabetes     Urinalysis: No results for  input(s): "COLORURINE", "LABSPEC", "PHURINE", "GLUCOSEU", "HGBUR", "BILIRUBINUR", "KETONESUR", "PROTEINUR", "UROBILINOGEN", "NITRITE", "LEUKOCYTESUR" in the last 72 hours.  Invalid input(s): "APPERANCEUR"     Imaging: No results found.   Medications:    feeding supplement (NEPRO CARB STEADY) 1,000 mL (02/18/23 1226)   vancomycin 1,000 mg (02/19/23 1115)    atorvastatin  20 mg Per Tube Daily   budesonide (PULMICORT) nebulizer solution  0.25 mg Nebulization BID   Chlorhexidine Gluconate Cloth  6 each Topical Q0600   cholestyramine  4 g Per Tube TID   escitalopram  10 mg Per Tube Daily   feeding supplement (PROSource TF20)  60 mL Per Tube Daily   fludrocortisone  0.2 mg Per Tube Daily   free water  30 mL Per Tube Q4H   heparin  5,000 Units Subcutaneous Q12H   hydrOXYzine  100 mg Per Tube BID   ipratropium-albuterol  3 mL Nebulization BID   midodrine  20 mg Per Tube TID WC   multivitamin  1 tablet Per Tube QHS   nutrition supplement (JUVEN)  1 packet Per Tube BID BM   mouth rinse  15 mL Mouth Rinse 4 times per day   pantoprazole (PROTONIX) IV  40 mg Intravenous QHS    Assessment/ Plan:     72 year old female with history of hypertension, coronary artery disease, congestive heart failure, COPD, cirrhosis, diabetes, status post left AKA, status post G-tube placement.  Patient is now being admitted for altered mental status.  Her last dialysis treatment was on Thursday.  She is also found to be septic.  The permacath was removed.    #1: End-stage renal disease: PermCath has been removed.  Source of infection is not clear.  Catheter tip cultures are negative so far.  Receiving Vancomycin Receiving dialysis today, UF 1L as tolerated. Next treatment scheduled scheduled for Saturday.    #2: Hypotension:   Receiving midodrine.  Blood pressure soft during dialysis . Will order albumin with dialysis for BP support.    #3: Sepsis: Blood cultures negative.  Continue vancomycin. Completed  IV Zosyn   #4: Secondary hyperparathyroidism: Calcium 8.5 with phosphorus 2.9  #5.  Hypokalemia-  potassium 2.8 4K dialysis bath used today.    LOS: 6 [facility] 11/7/202411:24 AM

## 2023-02-19 NOTE — Plan of Care (Signed)

## 2023-02-19 NOTE — Consult Note (Signed)
Pharmacy Antibiotic Note  Alexandra Foster is a 72 y.o. female admitted on 02/13/2023 with  altered mental status /?sepsis.  Pharmacy has been consulted for Vancomycin dosing.  -HD  TTS -Patient was given a line holiday for 48 hours after removal of HD PermCath. PermCath now back in place in right chest -Catheter tip culture NGTD, MRSA PCR (+), 1st HD 11/5, 2nd HD 11/7  -?PNA  Plan: DAY 7-    Vancomycin 1g IV with each HD session, currently TTS,  to be given at the end of each dialysis session.  F/u cultures, vanc before 3rd HD  Height: 5\' 4"  (162.6 cm) Weight: 87.5 kg (192 lb 14.4 oz) (Taken via bed.) IBW/kg (Calculated) : 54.7  Temp (24hrs), Avg:98.4 F (36.9 C), Min:98.2 F (36.8 C), Max:98.5 F (36.9 C)  Recent Labs  Lab 02/13/23 0629 02/14/23 0500 02/14/23 0845 02/14/23 1655 02/15/23 0424 02/15/23 1937 02/16/23 0414 02/17/23 0437 02/18/23 0410 02/19/23 0836  WBC 12.6* 32.9*  --   --  31.2*  --  26.3* 17.1* 12.1*  --   CREATININE  --  3.94*  --   --  4.65*  --  4.70* 4.97* 2.30* 2.98*  LATICACIDVEN 1.3 5.5* 3.8* 4.6*  --  1.5  --   --   --   --     Estimated Creatinine Clearance: 17.7 mL/min (A) (by C-G formula based on SCr of 2.98 mg/dL (H)).    No Known Allergies  Antimicrobials this admission: Vancomycin x1 11/1>>      Zosyn 11/1 >> 11/6 Cefepime/Azithro x 1 11/1 Fluconazole 11/2>>11/4  Dose adjustments this admission: HD dosing protocol of antibiotics being used  Microbiology results: 11/1 BCx: NGTD 11/1 UCx: multipl species 11/1 MRSA PCR: + 11/2 CAth tip cx NGTD  Thank you for allowing pharmacy to be a part of this patient's care.  Briella Hobday A 02/19/2023 11:25 AM

## 2023-02-19 NOTE — Care Management Important Message (Signed)
Important Message  Patient Details  Name: Alexandra Foster MRN: [id number] Date of Birth: July 17, 1950   Important Message Given:  Yes - Medicare IM     Olegario Messier A [name] 02/19/2023, 12:57 PM

## 2023-02-19 NOTE — Plan of Care (Signed)
  Problem: Nutrition: Goal: Adequate nutrition will be maintained Outcome: Progressing   Problem: Pain Management: Goal: General experience of comfort will improve Outcome: Progressing   Problem: Safety: Goal: Ability to remain free from injury will improve Outcome: Progressing

## 2023-02-20 ENCOUNTER — Encounter: Payer: Self-pay | Admitting: Nephrology

## 2023-02-20 ENCOUNTER — Other Ambulatory Visit: Payer: Self-pay

## 2023-02-20 LAB — ANAEROBIC CULTURE W GRAM STAIN

## 2023-02-20 LAB — POTASSIUM: Potassium: 3.1 mmol/L — ABNORMAL LOW (ref 3.5–5.1)

## 2023-02-20 LAB — GLUCOSE, CAPILLARY
Glucose-Capillary: 101 mg/dL — ABNORMAL HIGH (ref 70–99)
Glucose-Capillary: 103 mg/dL — ABNORMAL HIGH (ref 70–99)
Glucose-Capillary: 124 mg/dL — ABNORMAL HIGH (ref 70–99)
Glucose-Capillary: 82 mg/dL (ref 70–99)
Glucose-Capillary: 91 mg/dL (ref 70–99)

## 2023-02-20 MED ORDER — VANCOMYCIN HCL IN DEXTROSE 1-5 GM/200ML-% IV SOLN: 1000.0000 mg | INTRAVENOUS | Status: AC

## 2023-02-20 MED ORDER — AMIODARONE HCL 200 MG PO TABS: ORAL_TABLET | ORAL | Status: DC

## 2023-02-20 MED ORDER — HYDROXYZINE HCL 50 MG PO TABS: 100.0000 mg | ORAL_TABLET | Freq: Two times a day (BID) | ORAL | Status: DC

## 2023-02-20 MED ORDER — ZINC OXIDE 40 % EX OINT: TOPICAL_OINTMENT | Freq: Three times a day (TID) | CUTANEOUS | Status: DC | PRN

## 2023-02-20 NOTE — Discharge Summary (Addendum)
Physician Discharge Summary   Alexandra Foster  female DOB: April 06, 1951  FAO:[id number]  PCP: [name], MD  Admit date: 02/13/2023 Discharge date: 02/20/2023  Admitted From: home Disposition:  home Daughter updated on the phone prior to discharge. Home Health: Yes CODE STATUS: Full code  Discharge Instructions     Discharge wound care:   Complete by: As directed    Clean around G tube with water, apply a thin layer of Desitin to skin surrounding G tube.  May cut silver hydrofiber (Aquacel AG [facility] [place] [phone]) in same fashion as split gauze and place around G tube to absorb drainage.  If more absorption is needed order Drawtex 4x4 dressing [facility] [place] [phone]) and place around G tube. [facility] Course:  For full details, please see H&P, progress notes, consult notes and ancillary notes.  Briefly,  Alexandra Foster is a  72 y.o. female with medical history significant of ESRD on HD TTS, via a left chest PermCath, asthma/COPD, severe dysphagia status post G-tube, IIDM, HTN, left AKA, PAF, OSA on CPAP, cirrhosis, adrenal insufficiency on Cortef, brought in by family member for evaluation of sepsis.   Patient started to have altered mentations the day PTA and threw up x 1 during dialysis.  Overnight, patient became more confused.  According to daughter, patient's G-tube insertion site has a possible infected as she has been seeing more yellow-greenish pus coming out from around the G-tube entrance.  However patient does have a visiting wound care nurse and according to the wound care team, the G-tube site was not infected.  In addition, daughter also reported that for the last 2 HD sessions, the outpatient HD team reported that the PermCath on left chest " has some narrowing and blood flow very slow" and patient was scheduled to have PermCath exchanged next week.     Septic shock, resolved Patient has had low blood pressure despite multiple fluid boluses.  Sepsis criteria  met with leukocytosis, tachycardia, fever.  Potential sources include chest dialysis catheter versus gastric tube.  PEG tube was evaluated for septic focus by general surgery, who did not feel that PEG tube is infected.  Higher suspicion that HD catheter site may be infected, however, blood cultures were neg.  Urine cx grew multiple species, likely colonization.   --pt was on pressor, since weaned off.  started on oral midodrine and IV stress dose steroids --Patient was given a line holiday for 48 hours after removal of HD PermCath.  PermCath then re-inserted back in place in right chest --Pt was started on empiric Vanc/zosyn on presentation.  Zosyn was given for 6 days.  cont Vanc for 10 days starting from new PermCath insertion date, to be given with dialysis after discharge. --cont midodrine after discharge. --tapered down stress dose steroid and pt was back on her home Florinef prior to discharge.  Acute metabolic encephalopathy Likely secondary to sepsis.  Now resolved.   Acute transaminitis Downtrending, likely related to sepsis   ESRD on HD with access difficulties Patient was last dialyzed Thursday 10/31.  HD catheter was removed by vascular surgery Saturday 11/2.  Vascular surgery replaced vascular access 11/4. --iHD per nephro   PAF Patient not on anticoagulation for unclear reasons.   --hold amiodarone given bradycardia   G-tube dependent Severe dysphagia General Surgery consulted  Patient will remain n.p.o. as she has severe dysphagia.  All medications via tube.   Tube feeds restarted 11/3   Chronic adrenal insufficiency  Midodrine 20 mg 3 times daily.   --cont midodrine after discharge. --tapered down stress dose steroid and pt was back on her home Florinef prior to discharge.   OSA CPAP nightly   Hypokalemia --correct with HD   Diarrhea --per husband, started after presentation.  Likely due to abx use and tube feeds.  No fever, leukocytosis improving, no strong  evidence of C diff. --Lomotil PRN    Discharge Diagnoses:  Principal Problem:   Sepsis (HCC) Active Problems:   Aspiration pneumonia (HCC)   Septic shock (HCC)   Gastrostomy tube in place (HCC)   Pressure injury of skin   30 Day Unplanned Readmission Risk Score    Flowsheet Row ED to Hosp-Admission (Current) from 02/13/2023 in [facility] (1A)  30 Day Unplanned Readmission Risk Score (%) 27.43 Filed at 02/20/2023 0801       This score is the patient's risk of an unplanned readmission within 30 days of being discharged (0 -100%). The score is based on dignosis, age, lab data, medications, orders, and past utilization.   Low:  0-14.9   Medium: 15-21.9   High: 22-29.9   Extreme: 30 and above         Discharge Instructions:  Allergies as of 02/20/2023   No Known Allergies      Medication List     STOP taking these medications    DAPTOmycin 500 mg in sodium chloride 0.9 % 50 mL       TAKE these medications    acetaminophen 325 MG tablet Commonly known as: TYLENOL Place 2 tablets (650 mg total) into feeding tube every 6 (six) hours as needed for mild pain (or Fever >/= 101).   amiodarone 200 MG tablet Commonly known as: PACERONE Hold due to low heart rate, pending outpatient cardiology followup. What changed:  how much to take how to take this when to take this additional instructions   ascorbic acid 500 MG tablet Commonly known as: VITAMIN C Place 1 tablet (500 mg total) into feeding tube 2 (two) times daily.   atorvastatin 20 MG tablet Commonly known as: LIPITOR Place 1 tablet (20 mg total) into feeding tube daily.   cholestyramine 4 g packet Commonly known as: QUESTRAN Place 1 packet (4 g total) into feeding tube 3 (three) times daily.   clonazePAM 0.5 MG tablet Commonly known as: KLONOPIN Take 0.5-1 tablets (0.25-0.5 mg total) by mouth daily as needed for anxiety.   diphenoxylate-atropine 2.5-0.025 MG  tablet Commonly known as: LOMOTIL 1 tablet by Per J Tube route 4 (four) times daily as needed for diarrhea or loose stools.   escitalopram 10 MG tablet Commonly known as: LEXAPRO Place 1 tablet (10 mg total) into feeding tube daily.   famotidine 10 MG tablet Commonly known as: PEPCID Place 1 tablet (10 mg total) into feeding tube daily.   feeding supplement (NEPRO CARB STEADY) Liqd Place 1,120 mLs into feeding tube daily.   feeding supplement (NEPRO CARB STEADY) Liqd Place 1,120 mLs into feeding tube daily.   fludrocortisone 0.1 MG tablet Commonly known as: FLORINEF Place 2 tablets (0.2 mg total) into feeding tube daily.   free water Soln Place 50 mLs into feeding tube every 4 (four) hours.   hydrOXYzine 50 MG tablet Commonly known as: ATARAX Take 2 tablets (100 mg total) by mouth 2 (two) times daily. Home med. What changed:  how much to take when to take this reasons to take this additional instructions   liver oil-zinc  oxide 40 % ointment Commonly known as: DESITIN Apply topically 3 (three) times daily as needed for irritation. Apply to skin around G tube 3 times daily as needed for skin irritation   metoCLOPramide 5 MG tablet Commonly known as: REGLAN Take 1 tablet (5 mg total) by mouth 3 (three) times daily before meals.   midodrine 10 MG tablet Commonly known as: PROAMATINE Place 2 tablets (20 mg total) into feeding tube 3 (three) times daily with meals.   multivitamin Tabs tablet Place 1 tablet into feeding tube at bedtime.   nystatin powder Commonly known as: MYCOSTATIN/NYSTOP Apply topically 3 (three) times daily.   polyvinyl alcohol 1.4 % ophthalmic solution Commonly known as: LIQUIFILM TEARS Place 1 drop into both eyes as needed for dry eyes.   traZODone 50 MG tablet Commonly known as: DESYREL Take 0.5 tablets (25 mg total) by mouth at bedtime as needed for sleep.   vancomycin 1-5 GM/200ML-% Soln Commonly known as: VANCOCIN Inject 200 mLs  (1,000 mg total) into the vein Every Tuesday,Thursday,and Saturday with dialysis for 5 days. Start taking on: February 21, 2023   vitamin D3 25 MCG tablet Commonly known as: CHOLECALCIFEROL Place 2 tablets (2,000 Units total) into feeding tube daily.               Discharge Care Instructions  (From admission, onward)           Start     Ordered   02/20/23 0000  Discharge wound care:       Comments: Clean around G tube with water, apply a thin layer of Desitin to skin surrounding G tube.  May cut silver hydrofiber (Aquacel AG [facility] [place] [phone]) in same fashion as split gauze and place around G tube to absorb drainage.  If more absorption is needed order Drawtex 4x4 dressing [facility] [place] [phone]) and place around G tube. - -   02/20/23 1156             Follow-up Information     [name], MD Follow up in 1 week(s).   Specialty: Family Medicine Contact information: [address] [place] [address] [phone]                 No Known Allergies   The results of significant diagnostics from this hospitalization (including imaging, microbiology, ancillary and laboratory) are listed below for reference.   Consultations:   Procedures/Studies: PERIPHERAL VASCULAR CATHETERIZATION  Result Date: 02/16/2023 See surgical note for result.  ECHOCARDIOGRAM COMPLETE  Result Date: 02/15/2023    ECHOCARDIOGRAM REPORT   Patient Name:   Neika Friedrichs [place] Date of Exam: 02/15/2023 Medical Rec #:  [id number]            Height:       64.0 in Accession #:    [id number]          Weight:       187.6 lb Date of Birth:  February 08, 1951           BSA:          1.904 m Patient Age:    72 years            BP:           93/33 mmHg Patient Gender: F                   HR:           60 bpm. Exam Location:  [facility] Procedure: 2D Echo, Cardiac  Doppler and Color Doppler Indications:     Dyspnea R06.00  History:         Patient has prior history of Echocardiogram examinations, most                   recent 12/07/2022. COPD and ESRD; Risk Factors:Former Smoker and                  Sleep Apnea.  Sonographer:     Dondra Prader RVT RCS Referring Phys:  Raechel Chute Diagnosing Phys: Jodelle Red MD  Sonographer Comments: Dyspnea R06.00 IMPRESSIONS  1. Left ventricular ejection fraction, by estimation, is 60 to 65%. The left ventricle has normal function. The left ventricle has no regional wall motion abnormalities. Left ventricular diastolic parameters are indeterminate.  2. Right ventricular systolic function is normal. The right ventricular size is normal. There is mildly elevated pulmonary artery systolic pressure. The estimated right ventricular systolic pressure is 37.8 mmHg.  3. Left atrial size was mildly dilated.  4. The mitral valve is normal in structure. Trivial mitral valve regurgitation. No evidence of mitral stenosis. Moderate to severe mitral annular calcification.  5. The aortic valve is tricuspid. There is mild calcification of the aortic valve. There is mild thickening of the aortic valve. Aortic valve regurgitation is trivial. Aortic valve sclerosis/calcification is present, without any evidence of aortic stenosis.  6. The inferior vena cava is normal in size with greater than 50% respiratory variability, suggesting right atrial pressure of 3 mmHg. Comparison(s): No significant change from prior study. Conclusion(s)/Recommendation(s): Otherwise normal echocardiogram, with minor abnormalities described in the report. FINDINGS  Left Ventricle: Left ventricular ejection fraction, by estimation, is 60 to 65%. The left ventricle has normal function. The left ventricle has no regional wall motion abnormalities. The left ventricular internal cavity size was normal in size. There is  no left ventricular hypertrophy. Left ventricular diastolic parameters are indeterminate. Right Ventricle: The right ventricular size is normal. Right vetricular wall thickness was not well visualized.  Right ventricular systolic function is normal. There is mildly elevated pulmonary artery systolic pressure. The tricuspid regurgitant velocity  is 2.95 m/s, and with an assumed right atrial pressure of 3 mmHg, the estimated right ventricular systolic pressure is 37.8 mmHg. Left Atrium: Left atrial size was mildly dilated. Right Atrium: Right atrial size was normal in size. Pericardium: There is no evidence of pericardial effusion. Mitral Valve: The mitral valve is normal in structure. Moderate to severe mitral annular calcification. Trivial mitral valve regurgitation. No evidence of mitral valve stenosis. Tricuspid Valve: The tricuspid valve is normal in structure. Tricuspid valve regurgitation is mild . No evidence of tricuspid stenosis. Aortic Valve: The aortic valve is tricuspid. There is mild calcification of the aortic valve. There is mild thickening of the aortic valve. Aortic valve regurgitation is trivial. Aortic valve sclerosis/calcification is present, without any evidence of aortic stenosis. Aortic valve mean gradient measures 7.0 mmHg. Aortic valve peak gradient measures 14.1 mmHg. Aortic valve area, by VTI measures 2.92 cm. Pulmonic Valve: The pulmonic valve was grossly normal. Pulmonic valve regurgitation is mild. No evidence of pulmonic stenosis. Aorta: The aortic root, ascending aorta and aortic arch are all structurally normal, with no evidence of dilitation or obstruction. Venous: The inferior vena cava is normal in size with greater than 50% respiratory variability, suggesting right atrial pressure of 3 mmHg. IAS/Shunts: The atrial septum is grossly normal.  LEFT VENTRICLE PLAX 2D LVIDd:         5.35  cm   Diastology LVIDs:         3.60 cm   LV e' medial:    6.16 cm/s LV PW:         0.90 cm   LV E/e' medial:  11.7 LV IVS:        0.85 cm   LV e' lateral:   10.10 cm/s LVOT diam:     2.30 cm   LV E/e' lateral: 7.2 LV SV:         133 LV SV Index:   70 LVOT Area:     4.15 cm  RIGHT VENTRICLE              IVC RV S prime:     14.40 cm/s  IVC diam: 1.20 cm TAPSE (M-mode): 2.7 cm LEFT ATRIUM             Index        RIGHT ATRIUM           Index LA diam:        3.40 cm 1.79 cm/m   RA Area:     14.00 cm LA Vol (A2C):   39.9 ml 20.96 ml/m  RA Volume:   31.40 ml  16.49 ml/m LA Vol (A4C):   59.9 ml 31.46 ml/m LA Biplane Vol: 50.2 ml 26.37 ml/m  AORTIC VALVE                     PULMONIC VALVE AV Area (Vmax):    2.92 cm      PV Vmax:          1.21 m/s AV Area (Vmean):   2.89 cm      PV Peak grad:     5.9 mmHg AV Area (VTI):     2.92 cm      PR End Diast Vel: 5.86 msec AV Vmax:           188.00 cm/s AV Vmean:          124.000 cm/s AV VTI:            0.455 m AV Peak Grad:      14.1 mmHg AV Mean Grad:      7.0 mmHg LVOT Vmax:         132.00 cm/s LVOT Vmean:        86.200 cm/s LVOT VTI:          0.320 m LVOT/AV VTI ratio: 0.70  AORTA Ao Asc diam: 2.80 cm MITRAL VALVE                TRICUSPID VALVE MV Area (PHT): 2.37 cm     TR Peak grad:   34.8 mmHg MV Decel Time: 320 msec     TR Vmax:        295.00 cm/s MV E velocity: 72.30 cm/s MV A velocity: 103.00 cm/s  SHUNTS MV E/A ratio:  0.70         Systemic VTI:  0.32 m                             Systemic Diam: 2.30 cm Jodelle Red MD Electronically signed by Jodelle Red MD Signature Date/Time: 02/15/2023/1:37:51 PM    Final    DG Chest 1 View  Result Date: 02/13/2023 CLINICAL DATA:  Congestive heart failure EXAM: CHEST  1 VIEW COMPARISON:  12/25/2022 FINDINGS: Left central venous catheter with tip over the  right atrium. Shallow inspiration. Cardiac enlargement. No vascular congestion, edema, or consolidation. No pleural effusions. No pneumothorax. Mediastinal contours appear intact. Degenerative changes in the spine. IMPRESSION: Shallow inspiration. Cardiac enlargement. No evidence of active pulmonary disease. Electronically Signed   By: Burman Nieves M.D.   On: 02/13/2023 18:15   CT CHEST ABDOMEN PELVIS WO CONTRAST  Result Date:  02/13/2023 CLINICAL DATA:  Sepsis.  Mental status changes. EXAM: CT CHEST, ABDOMEN AND PELVIS WITHOUT CONTRAST TECHNIQUE: Multidetector CT imaging of the chest, abdomen and pelvis was performed following the standard protocol without IV contrast. RADIATION DOSE REDUCTION: This exam was performed according to the departmental dose-optimization program which includes automated exposure control, adjustment of the mA and/or kV according to patient size and/or use of iterative reconstruction technique. COMPARISON:  12/10/2022 FINDINGS: CT CHEST FINDINGS Cardiovascular: The heart size is normal. No substantial pericardial effusion. Left-sided central line tip is positioned in the right atrium Mediastinum/Nodes: No mediastinal lymphadenopathy. No evidence for gross hilar lymphadenopathy although assessment is limited by the lack of intravenous contrast on the current study. The esophagus has normal imaging features. There is no axillary lymphadenopathy. Lungs/Pleura: Architectural distortion is noted in the lungs bilaterally, right greater than left. Probable superimposed atelectasis in the dependent bases. Clustered nodular airspace disease in the anterior right lung (image 60/4) is new in the interval. No dense focal airspace consolidation. No pleural effusion. Musculoskeletal: No worrisome lytic or sclerotic osseous abnormality. CT ABDOMEN PELVIS FINDINGS Hepatobiliary: Nodular liver contour compatible cirrhosis. Calcified gallstones evident. No intrahepatic or extrahepatic biliary dilation. Pancreas: No focal mass lesion. No dilatation of the main duct. No intraparenchymal cyst. No peripancreatic edema. Spleen: No splenomegaly. No suspicious focal mass lesion. Adrenals/Urinary Tract: No adrenal nodule or mass. Nonobstructing stones noted right kidney. Left kidney unremarkable. No evidence for hydroureter. The urinary bladder appears normal for the degree of distention. Stomach/Bowel: Gastrostomy tube evident. There is  gas along the course of the gastrostomy tube which may be anterior wall of the stomach herniating into the defect in the rectus sheath for passage of the tube (see coronal 67 and 68 of series 6 and axial 60/2) although a small extraluminal focus of gas related to the G-tube tract could also have this appearance. No focal rim enhancing fluid collection to suggest abscess. Duodenum is normally positioned as is the ligament of Treitz. No small bowel wall thickening. No small bowel dilatation. The terminal ileum is normal. The appendix is normal. No gross colonic mass. No colonic wall thickening. Diverticular changes are noted in the left colon without evidence of diverticulitis. Vascular/Lymphatic: There is mild atherosclerotic calcification of the abdominal aorta without aneurysm. IVC filter visualized. There is no gastrohepatic or hepatoduodenal ligament lymphadenopathy. No retroperitoneal or mesenteric lymphadenopathy. No pelvic sidewall lymphadenopathy. Reproductive: There is no adnexal mass. Other: No intraperitoneal free fluid. Musculoskeletal: No worrisome lytic or sclerotic osseous abnormality. IMPRESSION: 1. Clustered nodular airspace disease in the anterior right lung is new in the interval, compatible with an infectious/inflammatory process. 2. Gastrostomy tube evident. There is gas along the course of the gastrostomy tube which may be anterior wall of the stomach herniating into the rectus sheath defect for the G tube although a small extraluminal focus of gas in the G-tube tract could also have this appearance. No focal rim enhancing fluid collection to suggest abscess. 3. Cirrhosis. 4. Cholelithiasis. 5. Nonobstructing right renal stones. 6. Left colonic diverticulosis without diverticulitis. 7.  Aortic Atherosclerosis (ICD10-I70.0). Electronically Signed   By: Jamison Oka.D.  On: 02/13/2023 06:59   CT Head Wo Contrast  Result Date: 02/13/2023 CLINICAL DATA:  Mental status change with unknown  cause EXAM: CT HEAD WITHOUT CONTRAST TECHNIQUE: Contiguous axial images were obtained from the base of the skull through the vertex without intravenous contrast. RADIATION DOSE REDUCTION: This exam was performed according to the departmental dose-optimization program which includes automated exposure control, adjustment of the mA and/or kV according to patient size and/or use of iterative reconstruction technique. COMPARISON:  Brain MRI 12/07/2022 FINDINGS: Brain: No evidence of acute infarction, hemorrhage, hydrocephalus, extra-axial collection or mass lesion/mass effect. Expanded and low-density sella, usually incidental in isolation. Generalized atrophy. Vascular: No hyperdense vessel or unexpected calcification. Skull: Normal. Negative for fracture or focal lesion. Sinuses/Orbits: No acute finding. IMPRESSION: No acute or interval finding. Electronically Signed   By: Tiburcio Pea M.D.   On: 02/13/2023 06:25   PERIPHERAL VASCULAR CATHETERIZATION  Result Date: 02/04/2023 See surgical note for result.     Labs: BNP (last 3 results) No results for input(s): "BNP" in the last 8760 hours. Basic Metabolic Panel: Recent Labs  Lab 02/15/23 0424 02/15/23 1344 02/15/23 1344 02/15/23 1651 02/16/23 0414 02/16/23 1724 02/17/23 0437 02/18/23 0410 02/19/23 0836 02/20/23 0222  NA 138  --   --   --  136  --  137 135 134*  --   K 4.5  --   --   --  4.2  --  4.2 3.5 2.8* 3.1*  CL 103  --   --   --  103  --  107 100 99  --   CO2 25  --   --   --  20*  --  19* 27 26  --   GLUCOSE 155*  --   --   --  114*  --  144* 153* 110*  --   BUN 72*  --   --   --  78*  --  85* 37* 76*  --   CREATININE 4.65*  --   --   --  4.70*  --  4.97* 2.30* 2.98*  --   CALCIUM 8.7*  --   --   --  8.8*  --  9.0 8.5* 8.5*  --   MG  --  2.1  --  2.0 2.2  --  2.4 2.0  --   --   PHOS  --  4.6   < > 5.2* 5.2* 5.0* 5.4* 2.4* 2.9  --    < > = values in this interval not displayed.   Liver Function Tests: Recent Labs  Lab  02/14/23 0500 02/15/23 0424 02/16/23 0414 02/17/23 0437 02/18/23 0410 02/19/23 0836  AST 127* 99* 144* 155* 155*  --   ALT 133* 142* 164* 197* 194*  --   ALKPHOS 112 90 116 131* 160*  --   BILITOT 0.9 0.8 0.8 0.7 0.8  --   PROT 5.8* 6.5 6.4* 6.3* 6.6  --   ALBUMIN 2.0* 2.2* 2.2* 2.3* 2.7* 2.4*   No results for input(s): "LIPASE", "AMYLASE" in the last 168 hours. No results for input(s): "AMMONIA" in the last 168 hours. CBC: Recent Labs  Lab 02/15/23 0424 02/16/23 0414 02/17/23 0437 02/18/23 0410 02/19/23 2250  WBC 31.2* 26.3* 17.1* 12.1* 14.5*  NEUTROABS 29.5* 25.0* 15.8* 10.8*  --   HGB 12.1 11.3* 10.7* 11.1* 10.8*  HCT 38.4 34.4* 33.0* 34.1* 33.1*  MCV 83.3 78.7* 79.5* 80.4 79.0*  PLT 295 351 353 303 287   Cardiac  Enzymes: No results for input(s): "CKTOTAL", "CKMB", "CKMBINDEX", "TROPONINI" in the last 168 hours. BNP: Invalid input(s): "POCBNP" CBG: Recent Labs  Lab 02/19/23 0340 02/19/23 2019 02/20/23 0013 02/20/23 0358 02/20/23 1032  GLUCAP 104* 104* 103* 101* 91   D-Dimer No results for input(s): "DDIMER" in the last 72 hours. Hgb A1c No results for input(s): "HGBA1C" in the last 72 hours. Lipid Profile No results for input(s): "CHOL", "HDL", "LDLCALC", "TRIG", "CHOLHDL", "LDLDIRECT" in the last 72 hours. Thyroid function studies No results for input(s): "TSH", "T4TOTAL", "T3FREE", "THYROIDAB" in the last 72 hours.  Invalid input(s): "FREET3" Anemia work up No results for input(s): "VITAMINB12", "FOLATE", "FERRITIN", "TIBC", "IRON", "RETICCTPCT" in the last 72 hours. Urinalysis    Component Value Date/Time   COLORURINE AMBER (A) 02/13/2023 0629   APPEARANCEUR CLOUDY (A) 02/13/2023 0629   LABSPEC 1.016 02/13/2023 0629   PHURINE 5.0 02/13/2023 0629   GLUCOSEU NEGATIVE 02/13/2023 0629   HGBUR NEGATIVE 02/13/2023 0629   BILIRUBINUR NEGATIVE 02/13/2023 0629   KETONESUR NEGATIVE 02/13/2023 0629   PROTEINUR 30 (A) 02/13/2023 0629   NITRITE NEGATIVE  02/13/2023 0629   LEUKOCYTESUR LARGE (A) 02/13/2023 0629   Sepsis Labs Recent Labs  Lab 02/16/23 0414 02/17/23 0437 02/18/23 0410 02/19/23 2250  WBC 26.3* 17.1* 12.1* 14.5*   Microbiology Recent Results (from the past 240 hour(s))  Culture, blood (Routine x 2)     Status: None   Collection Time: 02/13/23  4:39 AM   Specimen: BLOOD  Result Value Ref Range Status   Specimen Description BLOOD BLOOD LEFT ARM  Final   Special Requests   Final    BOTTLES DRAWN AEROBIC AND ANAEROBIC Blood Culture adequate volume   Culture   Final    NO GROWTH 5 DAYS Performed at [facility], [address] [place], [place] [address]    Report Status 02/18/2023 FINAL  Final  Culture, blood (Routine x 2)     Status: None   Collection Time: 02/13/23  4:39 AM   Specimen: BLOOD  Result Value Ref Range Status   Specimen Description BLOOD BLOOD RIGHT ARM  Final   Special Requests   Final    BOTTLES DRAWN AEROBIC AND ANAEROBIC Blood Culture adequate volume   Culture   Final    NO GROWTH 5 DAYS Performed at [facility], [address], [place], [place] [address]    Report Status 02/18/2023 FINAL  Final  Urine Culture     Status: Abnormal   Collection Time: 02/13/23  6:29 AM   Specimen: Urine, Random  Result Value Ref Range Status   Specimen Description   Final    URINE, RANDOM Performed at [facility], [address], [place], [place] [address]    Special Requests   Final    NONE Reflexed from [id number] Performed at [facility], [address] [place], [place] [address]    Culture MULTIPLE SPECIES PRESENT, SUGGEST RECOLLECTION (A)  Final   Report Status 02/14/2023 FINAL  Final  Resp panel by RT-PCR (RSV, Flu A&B, Covid) Anterior Nasal Swab     Status: None   Collection Time: 02/13/23  1:30 PM   Specimen: Anterior Nasal Swab  Result Value Ref Range Status   SARS Coronavirus 2 by RT PCR NEGATIVE NEGATIVE Final    Comment: (NOTE) SARS-CoV-2 target  nucleic acids are NOT DETECTED.  The SARS-CoV-2 RNA is generally detectable in upper respiratory specimens during the acute phase of infection. The lowest concentration of SARS-CoV-2 viral  copies this assay can detect is 138 copies/mL. A negative result does not preclude SARS-Cov-2 infection and should not be used as the sole basis for treatment or other patient management decisions. A negative result may occur with  improper specimen collection/handling, submission of specimen other than nasopharyngeal swab, presence of viral mutation(s) within the areas targeted by this assay, and inadequate number of viral copies(<138 copies/mL). A negative result must be combined with clinical observations, patient history, and epidemiological information. The expected result is Negative.  Fact Sheet for Patients:  BloggerCourse.com  Fact Sheet for Healthcare Providers:  SeriousBroker.it  This test is no t yet approved or cleared by the [place] FDA and  has been authorized for detection and/or diagnosis of SARS-CoV-2 by FDA under an Emergency Use Authorization (EUA). This EUA will remain  in effect (meaning this test can be used) for the duration of the COVID-19 declaration under Section 564(b)(1) of the Act, 21 U.S.C.section 360bbb-3(b)(1), unless the authorization is terminated  or revoked sooner.       Influenza A by PCR NEGATIVE NEGATIVE Final   Influenza B by PCR NEGATIVE NEGATIVE Final    Comment: (NOTE) The Xpert Xpress SARS-CoV-2/FLU/RSV plus assay is intended as an aid in the diagnosis of influenza from Nasopharyngeal swab specimens and should not be used as a sole basis for treatment. Nasal washings and aspirates are unacceptable for Xpert Xpress SARS-CoV-2/FLU/RSV testing.  Fact Sheet for Patients: BloggerCourse.com  Fact Sheet for Healthcare  Providers: SeriousBroker.it  This test is not yet approved or cleared by the [place] FDA and has been authorized for detection and/or diagnosis of SARS-CoV-2 by FDA under an Emergency Use Authorization (EUA). This EUA will remain in effect (meaning this test can be used) for the duration of the COVID-19 declaration under Section 564(b)(1) of the Act, 21 U.S.C. section 360bbb-3(b)(1), unless the authorization is terminated or revoked.     Resp Syncytial Virus by PCR NEGATIVE NEGATIVE Final    Comment: (NOTE) Fact Sheet for Patients: BloggerCourse.com  Fact Sheet for Healthcare Providers: SeriousBroker.it  This test is not yet approved or cleared by the [place] FDA and has been authorized for detection and/or diagnosis of SARS-CoV-2 by FDA under an Emergency Use Authorization (EUA). This EUA will remain in effect (meaning this test can be used) for the duration of the COVID-19 declaration under Section 564(b)(1) of the Act, 21 U.S.C. section 360bbb-3(b)(1), unless the authorization is terminated or revoked.  Performed at [facility], [address] [place], [place] [address]   MRSA Next Gen by PCR, Nasal     Status: Abnormal   Collection Time: 02/13/23  9:05 PM   Specimen: Nasal Mucosa; Nasal Swab  Result Value Ref Range Status   MRSA by PCR Next Gen DETECTED (A) NOT DETECTED Final    Comment: CRITICAL RESULT CALLED TO, READ BACK BY AND VERIFIED WITH: Mosetta Putt RN @ 2300 02/13/23 [facility] (NOTE) The GeneXpert MRSA Assay (FDA approved for NASAL specimens only), is one component of a comprehensive MRSA colonization surveillance program. It is not intended to diagnose MRSA infection nor to guide or monitor treatment for MRSA infections. Test performance is not FDA approved in patients less than 69 years old. Performed at [facility], [address] [place], [place]  [address]   MRSA Next Gen by PCR, Nasal     Status: Abnormal   Collection Time: 02/14/23  1:15 PM   Specimen: Nasal Mucosa; Nasal Swab  Result Value Ref Range  Status   MRSA by PCR Next Gen DETECTED (A) NOT DETECTED Final    Comment: RESULT CALLED TO, READ BACK BY AND VERIFIED WITH: BRITTNEY MANSFIELD AT 1614 02/14/23.PMF (NOTE) The GeneXpert MRSA Assay (FDA approved for NASAL specimens only), is one component of a comprehensive MRSA colonization surveillance program. It is not intended to diagnose MRSA infection nor to guide or monitor treatment for MRSA infections. Test performance is not FDA approved in patients less than 60 years old. Performed at [facility], [address], [place], [place] [address]   Aerobic Culture w Gram Stain (superficial specimen)     Status: None   Collection Time: 02/14/23  4:55 PM   Specimen: Catheter Tip  Result Value Ref Range Status   Specimen Description   Final    CATH TIP Performed at [facility], [address] [place], [place] [address]    Special Requests   Final    NONE Performed at [facility], [address] [place], [place] [address]    Gram Stain   Final    RARE WBC PRESENT, PREDOMINANTLY PMN NO ORGANISMS SEEN    Culture   Final    NO GROWTH 2 DAYS Performed at [facility], [address], [place], [place] [address]    Report Status 02/17/2023 FINAL  Final  Anaerobic culture w Gram Stain     Status: None (Preliminary result)   Collection Time: 02/14/23  4:55 PM   Specimen: Catheter Tip  Result Value Ref Range Status   Specimen Description   Final    CATH TIP Performed at [facility], [address] [place], [place] [address]    Special Requests   Final    NONE Performed at [facility], [address] [place], [place] [address]    Gram Stain   Final    RARE WBC PRESENT, PREDOMINANTLY PMN NO ORGANISMS SEEN Performed at [facility], [address],  [place], [place] [address]    Culture   Final    NO ANAEROBES ISOLATED; CULTURE IN PROGRESS FOR 5 DAYS   Report Status PENDING  Incomplete     Total time spend on discharging this patient, including the last patient exam, discussing the hospital stay, instructions for ongoing care as it relates to all pertinent caregivers, as well as preparing the medical discharge records, prescriptions, and/or referrals as applicable, is 45 minutes.    [name], MD  [facility] 02/20/2023, 11:56 AM

## 2023-02-20 NOTE — Progress Notes (Signed)
Enfit connecter with port closure attached to g tube for discharge. Home health to bring supplies to the house. Family educated and aware on how to use current set-up with enfit supplies.

## 2023-02-20 NOTE — Progress Notes (Signed)
Addendum  Continue Vancomycin 1 gm iv with each dialysis x 3 doses. Last dose 02/26/2023

## 2023-02-20 NOTE — TOC Transition Note (Signed)
Transition of Care [facility]) - CM/SW Discharge Note   Patient Details  Name: Alexandra Foster MRN: [id number] Date of Birth: [birth year]  Transition of Care [facility]) CM/SW Contact:  Garret Reddish, RN Phone Number: 02/20/2023, 2:44 PM   Clinical Narrative:     Chart reviewed.  Noted that patient has orders for discharge today.    I have meet with patient and her sisters at bedside today.   Patient's sisters report that that patient lives at home with her husband.  Patient has caregivers that care for her daily.  Sisters report that patient is active with [facility].  I have informed Morrie Sheldon with [facility] that patient will be a discharge home for today.  I have informed Morrie Sheldon that home care services will be home health PT, OT, and RN.    I have informed Pam with [facility] that patient will be going home on enfit connector and patient will need supplies at home.  Pam reports that [facility] supplies patient's tube feeding and will provide all needed tube feeding supplies.    Family has requested EMS transport.  I have confirmed address of [address] [place], [place] [address].  I have arranged [facility] to transport patient home today.    I have informed staff nurse of the above information.  Final next level of care: Home w Home Health Services Barriers to Discharge: No Barriers Identified   Patient Goals and CMS Choice      Discharge Placement                  Patient to be transferred to facility by:  [facility] will transport patient home today) Name of family member notified: Patient's sisters are at bedside and have been notified about transport today. Patient and family notified of of transfer: 02/20/23  Discharge Plan and Services Additional resources added to the After Visit Summary for       Post Acute Care Choice: Resumption of Svcs/PTA Provider                    HH Arranged: PT, RN, OT [facility] Agency: [facility] ([facility]) Date [facility] Agency  Contacted: 02/20/23   Representative spoke with at [facility] Agency: Morrie Sheldon  Social Determinants of Health (SDOH) Interventions SDOH Screenings   Food Insecurity: Patient Unable To Answer (02/20/2023)  Housing: Patient Unable To Answer (02/20/2023)  Recent Concern: Housing - High Risk (12/06/2022)  Transportation Needs: Patient Unable To Answer (02/20/2023)  Utilities: Patient Unable To Answer (02/20/2023)  Alcohol Screen: Low Risk  (02/11/2021)  Depression (PHQ2-9): High Risk (02/11/2023)  Financial Resource Strain: Medium Risk (08/09/2020)  Physical Activity: Inactive (08/09/2020)  Social Connections: Moderately Integrated (08/09/2020)  Stress: No Stress Concern Present (08/09/2020)  Tobacco Use: Medium Risk (02/13/2023)     Readmission Risk Interventions    02/16/2023   12:26 PM 12/26/2022   11:15 AM  Readmission Risk Prevention Plan  Transportation Screening Complete Complete  PCP or Specialist Appt within 3-5 Days Complete   HRI or Home Care Consult Complete   Social Work Consult for Recovery Care Planning/Counseling Complete   Palliative Care Screening Not Applicable   Medication Review Oceanographer) Complete Complete  PCP or Specialist appointment within 3-5 days of discharge  Complete  HRI or Home Care Consult  Complete  SW Recovery Care/Counseling Consult  Complete  Palliative Care Screening  Not Applicable  Skilled Nursing Facility  Not Applicable

## 2023-02-20 NOTE — Progress Notes (Signed)
Notified Dr. Fran Lowes of BG 82 per order (order states to notify of BG <90).  Per Dr. Fran Lowes continue to monitor for now.

## 2023-02-20 NOTE — Progress Notes (Signed)
[facility]  PROGRESS NOTE   Subjective:   Patient seen resting in bed Sister at bedside Patient denies pain Room air, denies shortness of breath  Objective:  Vital signs: Blood pressure 119/66, pulse (!) 52, temperature 98.3 F (36.8 C), resp. rate 16, height 5\' 4"  (1.626 m), weight 83 kg, SpO2 98%.  Intake/Output Summary (Last 24 hours) at 02/20/2023 1305 Last data filed at 02/19/2023 1500 Gross per 24 hour  Intake 120 ml  Output --  Net 120 ml    Filed Weights   02/19/23 0821 02/19/23 1237 02/20/23 0716  Weight: 87.5 kg 86.4 kg 83 kg     Physical Exam: General:  No acute distress  Head:  Normocephalic, atraumatic. Moist oral mucosal membranes  Eyes:  Anicteric  Lungs:   Clear to auscultation, normal effort  Heart:  S1S2 no rubs  Abdomen:   Soft, nontender, bowel sounds present  Extremities:  Trace peripheral edema.  Neurologic:  Awake, alert, following commands  Skin:  No lesions  Access: Rt chest permcath/Dr. Dew/02/16/2023    Basic Metabolic Panel: Recent Labs  Lab 02/15/23 0424 02/15/23 1344 02/15/23 1344 02/15/23 1651 02/16/23 0414 02/16/23 1724 02/17/23 0437 02/18/23 0410 02/19/23 0836 02/20/23 0222  NA 138  --   --   --  136  --  137 135 134*  --   K 4.5  --   --   --  4.2  --  4.2 3.5 2.8* 3.1*  CL 103  --   --   --  103  --  107 100 99  --   CO2 25  --   --   --  20*  --  19* 27 26  --   GLUCOSE 155*  --   --   --  114*  --  144* 153* 110*  --   BUN 72*  --   --   --  78*  --  85* 37* 76*  --   CREATININE 4.65*  --   --   --  4.70*  --  4.97* 2.30* 2.98*  --   CALCIUM 8.7*  --   --   --  8.8*  --  9.0 8.5* 8.5*  --   MG  --  2.1  --  2.0 2.2  --  2.4 2.0  --   --   PHOS  --  4.6   < > 5.2* 5.2* 5.0* 5.4* 2.4* 2.9  --    < > = values in this interval not displayed.   GFR: Estimated Creatinine Clearance: 17.3 mL/min (A) (by C-G formula based on SCr of 2.98 mg/dL (H)).  Liver Function Tests: Recent Labs  Lab 02/14/23 0500  02/15/23 0424 02/16/23 0414 02/17/23 0437 02/18/23 0410 02/19/23 0836  AST 127* 99* 144* 155* 155*  --   ALT 133* 142* 164* 197* 194*  --   ALKPHOS 112 90 116 131* 160*  --   BILITOT 0.9 0.8 0.8 0.7 0.8  --   PROT 5.8* 6.5 6.4* 6.3* 6.6  --   ALBUMIN 2.0* 2.2* 2.2* 2.3* 2.7* 2.4*   No results for input(s): "LIPASE", "AMYLASE" in the last 168 hours. No results for input(s): "AMMONIA" in the last 168 hours.   CBC: Recent Labs  Lab 02/15/23 0424 02/16/23 0414 02/17/23 0437 02/18/23 0410 02/19/23 2250  WBC 31.2* 26.3* 17.1* 12.1* 14.5*  NEUTROABS 29.5* 25.0* 15.8* 10.8*  --   HGB 12.1 11.3* 10.7* 11.1* 10.8*  HCT 38.4  34.4* 33.0* 34.1* 33.1*  MCV 83.3 78.7* 79.5* 80.4 79.0*  PLT 295 351 353 303 287     HbA1C: Hgb A1c MFr Bld  Date/Time Value Ref Range Status  02/11/2023 02:55 PM 4.7 <5.7 % of total Hgb Final    Comment:    For the purpose of screening for the presence of diabetes: . <5.7%       Consistent with the absence of diabetes 5.7-6.4%    Consistent with increased risk for diabetes             (prediabetes) > or =6.5%  Consistent with diabetes . This assay result is consistent with a decreased risk of diabetes. . Currently, no consensus exists regarding use of hemoglobin A1c for diagnosis of diabetes in children. . According to American Diabetes Association (ADA) guidelines, hemoglobin A1c <7.0% represents optimal control in non-pregnant diabetic patients. Different metrics may apply to specific patient populations.  Standards of Medical Care in Diabetes(ADA). [name]   12/07/2022 02:13 AM 5.2 4.8 - 5.6 % Final    Comment:    (NOTE) Pre diabetes:          5.7%-6.4%  Diabetes:              >6.4%  Glycemic control for   <7.0% adults with diabetes     Urinalysis: No results for input(s): "COLORURINE", "LABSPEC", "PHURINE", "GLUCOSEU", "HGBUR", "BILIRUBINUR", "KETONESUR", "PROTEINUR", "UROBILINOGEN", "NITRITE", "LEUKOCYTESUR" in the last 72  hours.  Invalid input(s): "APPERANCEUR"     Imaging: No results found.   Medications:    feeding supplement (NEPRO CARB STEADY) 1,000 mL (02/18/23 1226)   vancomycin Stopped (02/19/23 1215)    atorvastatin  20 mg Per Tube Daily   Chlorhexidine Gluconate Cloth  6 each Topical Q0600   cholestyramine  4 g Per Tube TID   escitalopram  10 mg Per Tube Daily   feeding supplement (PROSource TF20)  60 mL Per Tube Daily   fludrocortisone  0.2 mg Per Tube Daily   free water  30 mL Per Tube Q4H   heparin  5,000 Units Subcutaneous Q12H   hydrOXYzine  100 mg Per Tube BID   midodrine  20 mg Per Tube TID WC   multivitamin  1 tablet Per Tube QHS   nutrition supplement (JUVEN)  1 packet Per Tube BID BM   mouth rinse  15 mL Mouth Rinse 4 times per day   pantoprazole (PROTONIX) IV  40 mg Intravenous QHS    Assessment/ Plan:     72 year old female with history of hypertension, coronary artery disease, congestive heart failure, COPD, cirrhosis, diabetes, status post left AKA, status post G-tube placement.  Patient is now being admitted for altered mental status.  Her last dialysis treatment was on Thursday.  She is also found to be septic.  The permacath was removed.    #1: End-stage renal disease: PermCath has been removed.  Source of infection is not clear.  Catheter tip cultures are negative so far.  Receiving Vancomycin Dialysis received yesterday, UF achieved. Next treatment scheduled on Saturday.    #2: Hypotension:   Receiving midodrine.  Blood pressure stable 119/66   #3: Sepsis: Blood cultures negative.  Continue vancomycin. Completed IV Zosyn, per primary team.    #4: Secondary hyperparathyroidism: Will continue to monitor bone minerals  #5.  Hypokalemia-  potassium 3.1, Will continue to use 4K bath as needed during dialysis to correct potassium.     LOS: 7 [facility] kidney [address]  11/8/20241:05 PM

## 2023-02-23 ENCOUNTER — Telehealth: Payer: Self-pay | Admitting: Family Medicine

## 2023-02-23 ENCOUNTER — Other Ambulatory Visit: Payer: Self-pay

## 2023-02-23 ENCOUNTER — Telehealth: Payer: Self-pay

## 2023-02-23 DIAGNOSIS — E1169 Type 2 diabetes mellitus with other specified complication: Secondary | ICD-10-CM

## 2023-02-23 MED ORDER — ACCU-CHEK GUIDE VI STRP: ORAL_STRIP | 0 refills | Status: DC

## 2023-02-23 NOTE — Transitions of Care (Post Inpatient/ED Visit) (Signed)
02/23/2023  Name: Alexandra Foster MRN: [id number] DOB: Jun 21, 1950  Today's TOC FU Call Status: Today's TOC FU Call Status:: Successful TOC FU Call Completed TOC FU Call Complete Date: 02/23/23 Patient's Name and Date of Birth confirmed.  Transition Care Management Follow-up Telephone Call Date of Discharge: 02/20/23 Discharge Facility: [facility]) Type of Discharge: Inpatient Admission Primary Inpatient Discharge Diagnosis:: encephalopathy How have you been since you were released from the hospital?: Better Any questions or concerns?: No  Items Reviewed: Did you receive and understand the discharge instructions provided?: Yes Medications obtained,verified, and reconciled?: Yes (Medications Reviewed) Any new allergies since your discharge?: No Dietary orders reviewed?: Yes Do you have support at home?: Yes People in Home: spouse, child(ren), adult  Medications Reviewed Today: Medications Reviewed Today     Reviewed by Karena Addison, LPN (Licensed Practical Nurse) on 02/23/23 at 1009  Med List Status: <None>   Medication Order Taking? Sig Documenting Provider Last Dose Status Informant  acetaminophen (TYLENOL) 325 MG tablet [id number] No Place 2 tablets (650 mg total) into feeding tube every 6 (six) hours as needed for mild pain (or Fever >/= 101). Alford Highland, MD 02/12/2023 Active Care Giver  amiodarone (PACERONE) 200 MG tablet [id number]  Hold due to low heart rate, pending outpatient cardiology followup. [name], MD  Active   ascorbic acid (VITAMIN C) 500 MG tablet [id number] No Place 1 tablet (500 mg total) into feeding tube 2 (two) times daily. Erin Fulling, MD 02/12/2023 Active Care Giver  atorvastatin (LIPITOR) 20 MG tablet [id number] No Place 1 tablet (20 mg total) into feeding tube daily. [name], MD 02/12/2023 Active Care Giver  cholestyramine (QUESTRAN) 4 g packet [id number] No Place 1 packet (4 g total) into feeding tube 3  (three) times daily. [name], MD 02/12/2023 Active Care Giver  clonazePAM (KLONOPIN) 0.5 MG tablet [id number] No Take 0.5-1 tablets (0.25-0.5 mg total) by mouth daily as needed for anxiety. [name], MD 02/12/2023 Active Care Giver  diphenoxylate-atropine (LOMOTIL) 2.5-0.025 MG tablet [id number] No 1 tablet by Per J Tube route 4 (four) times daily as needed for diarrhea or loose stools. Alford Highland, MD prn unk Active Care Giver  escitalopram (LEXAPRO) 10 MG tablet [id number] No Place 1 tablet (10 mg total) into feeding tube daily. [name], MD 02/12/2023 Active Care Giver  famotidine (PEPCID) 10 MG tablet [id number] No Place 1 tablet (10 mg total) into feeding tube daily. [name], MD 02/12/2023 Active Care Giver  fludrocortisone (FLORINEF) 0.1 MG tablet [id number] No Place 2 tablets (0.2 mg total) into feeding tube daily. Alford Highland, MD 02/12/2023 Active Care Giver  hydrOXYzine (ATARAX) 50 MG tablet [id number]  Take 2 tablets (100 mg total) by mouth 2 (two) times daily. Home med. [name], MD  Active   liver oil-zinc oxide (DESITIN) 40 % ointment [id number]  Apply topically 3 (three) times daily as needed for irritation. Apply to skin around G tube 3 times daily as needed for skin irritation [name], MD  Active   metoCLOPramide (REGLAN) 5 MG tablet [id number] No Take 1 tablet (5 mg total) by mouth 3 (three) times daily before meals. [name], MD 02/12/2023 Active Care Giver  midodrine (PROAMATINE) 10 MG tablet [id number] No Place 2 tablets (20 mg total) into feeding tube 3 (three) times daily with meals. [name], MD 02/12/2023 Active Care Giver  multivitamin (RENA-VIT) TABS tablet [id number] No Place 1 tablet into feeding tube at bedtime. [name], MD 02/12/2023 Active Care Giver  Nutritional Supplements (FEEDING SUPPLEMENT, NEPRO CARB STEADY,) LIQD [id number] No Place 1,120 mLs into feeding tube daily. Alford Highland, MD Taking Active Care Giver   Nutritional Supplements (FEEDING SUPPLEMENT, NEPRO CARB STEADY,) LIQD [id number] No Place 1,120 mLs into feeding tube daily. Enedina Finner, MD Taking Active Care Giver  nystatin (MYCOSTATIN/NYSTOP) powder [id number] No Apply topically 3 (three) times daily. Alford Highland, MD 02/12/2023 Active Care Giver  polyvinyl alcohol (LIQUIFILM TEARS) 1.4 % ophthalmic solution [id number] No Place 1 drop into both eyes as needed for dry eyes. Alford Highland, MD Past Week Active Care Giver  traZODone (DESYREL) 50 MG tablet [id number] No Take 0.5 tablets (25 mg total) by mouth at bedtime as needed for sleep. [name], MD 02/12/2023 Active Care Giver  vancomycin (VANCOCIN) 1-5 GM/200ML-% SOLN [id number]  Inject 200 mLs (1,000 mg total) into the vein Every Tuesday,Thursday,and Saturday with dialysis for 5 days. [name], MD  Active   vitamin D3 (CHOLECALCIFEROL) 25 MCG tablet [id number] No Place 2 tablets (2,000 Units total) into feeding tube daily. Enedina Finner, MD 02/12/2023 Active Care Giver  Water For Irrigation, Sterile (FREE WATER) SOLN [id number] No Place 50 mLs into feeding tube every 4 (four) hours. Alford Highland, MD Taking Active Care Giver  Med List Note Sharia Reeve, CPhT 12/06/22 1130): [facility] [phone]            Home Care and Equipment/Supplies: Were Home Health Services Ordered?: NA Any new equipment or medical supplies ordered?: NA  Functional Questionnaire: Do you need assistance with bathing/showering or dressing?: Yes Do you need assistance with meal preparation?: Yes Do you need assistance with eating?: Yes Do you have difficulty maintaining continence: Yes Do you need assistance with getting out of bed/getting out of a chair/moving?: Yes Do you have difficulty managing or taking your medications?: Yes  Follow up appointments reviewed: PCP Follow-up appointment confirmed?: Yes Date of PCP follow-up appointment?: 02/27/23 Follow-up Provider:  [facility] Follow-up appointment confirmed?: NA Do you need transportation to your follow-up appointment?: No Do you understand care options if your condition(s) worsen?: Yes-patient verbalized understanding    SIGNATURE Karena Addison, LPN [facility] Nurse Health Advisor Direct Dial [phone]

## 2023-02-23 NOTE — Telephone Encounter (Signed)
AccuChek Test Strips not on current list.

## 2023-02-23 NOTE — Telephone Encounter (Signed)
Home Health Verbal Orders - Caller/Agency: Marchelle Folks from [facility] Callback Number:  [phone] Service Requested: Skilled Nursing Frequency: 2w2 Will need to recertify for home health.     Any new concerns about the patient? Yes   Marchelle Folks noted 3 new pressure ulcers. Wound care was putting bordered foam.

## 2023-02-23 NOTE — Transitions of Care (Post Inpatient/ED Visit) (Signed)
02/23/2023  Name: Alexandra Foster MRN: [id number] DOB: [birth year]  Today's TOC FU Call Status: Today's TOC FU Call Status:: Successful TOC FU Call Completed TOC FU Call Complete Date: 02/23/23 Patient's Name and Date of Birth confirmed.  Transition Care Management Follow-up Telephone Call Date of Discharge: 02/20/23 Discharge Facility: [facility]) Type of Discharge: Inpatient Admission Primary Inpatient Discharge Diagnosis:: Acute Encephalopathy How have you been since you were released from the hospital?: Better Any questions or concerns?: Yes Patient Questions/Concerns:: still unable ro get BG testing strips Patient Questions/Concerns Addressed: Other: (Called PCP requested an order be called in [facility] Nurse will f/U)  Items Reviewed: Did you receive and understand the discharge instructions provided?: Yes Medications obtained,verified, and reconciled?: Yes (Medications Reviewed) Any new allergies since your discharge?: No Dietary orders reviewed?: Yes Type of Diet Ordered:: She is on Tube feeding  NEPRO CARB STEADY LIQUID1,120 mL, with water flushes Do you have support at home?: Yes People in Home: spouse, child(ren), dependent, sibling(s) Name of Support/Comfort Primary Source: Spoise [name] Daughter Ardean Larsen Sister Steward Drone  Medications Reviewed Today: Medications Reviewed Today     Reviewed by Johnnette Barrios, RN (Registered Nurse) on 02/23/23 at 1107  Med List Status: <None>   Medication Order Taking? Sig Documenting Provider Last Dose Status Informant  acetaminophen (TYLENOL) 325 MG tablet [id number] Yes Place 2 tablets (650 mg total) into feeding tube every 6 (six) hours as needed for mild pain (or Fever >/= 101). Alford Highland, MD Taking Active Care Giver  amiodarone (PACERONE) 200 MG tablet [id number] No Hold due to low heart rate, pending outpatient cardiology followup.  Patient not taking: Reported on 02/23/2023   [name],  MD Not Taking Active   ascorbic acid (VITAMIN C) 500 MG tablet [id number] Yes Place 1 tablet (500 mg total) into feeding tube 2 (two) times daily. Erin Fulling, MD Taking Active Care Giver  atorvastatin (LIPITOR) 20 MG tablet [id number] Yes Place 1 tablet (20 mg total) into feeding tube daily. [name], MD Taking Active Care Giver  cholestyramine Lanetta Inch) 4 g packet [id number] No Place 1 packet (4 g total) into feeding tube 3 (three) times daily.  Patient not taking: Reported on 02/23/2023   [name], MD Not Taking Active Care Giver  clonazePAM [facility]) 0.5 MG tablet [id number] Yes Take 0.5-1 tablets (0.25-0.5 mg total) by mouth daily as needed for anxiety. [name], MD Taking Active Care Giver  diphenoxylate-atropine (LOMOTIL) 2.5-0.025 MG tablet [id number] No 1 tablet by Per J Tube route 4 (four) times daily as needed for diarrhea or loose stools.  Patient not taking: Reported on 02/23/2023   Alford Highland, MD Not Taking Active Care Giver  escitalopram (LEXAPRO) 10 MG tablet [id number] Yes Place 1 tablet (10 mg total) into feeding tube daily. [name], MD Taking Active Care Giver  famotidine (PEPCID) 10 MG tablet [id number] Yes Place 1 tablet (10 mg total) into feeding tube daily. [name], MD Taking Active Care Giver  fludrocortisone (FLORINEF) 0.1 MG tablet [id number] Yes Place 2 tablets (0.2 mg total) into feeding tube daily. Alford Highland, MD Taking Active Care Giver  hydrOXYzine (ATARAX) 50 MG tablet [id number] No Take 2 tablets (100 mg total) by mouth 2 (two) times daily. Home med.  Patient not taking: Reported on 02/23/2023   [name], MD Not Taking Active   liver oil-zinc oxide (DESITIN) 40 % ointment [id number] Yes Apply topically 3 (three) times daily as needed for irritation. Apply to skin around G  tube 3 times daily as needed for skin irritation [name], MD Taking Active   metoCLOPramide (REGLAN) 5 MG tablet [id number] Yes Take 1 tablet (5 mg total)  by mouth 3 (three) times daily before meals. [name], MD Taking Active Care Giver  midodrine (PROAMATINE) 10 MG tablet [id number] Yes Place 2 tablets (20 mg total) into feeding tube 3 (three) times daily with meals. [name], MD Taking Active Care Giver  multivitamin (RENA-VIT) TABS tablet [id number] Yes Place 1 tablet into feeding tube at bedtime. [name], MD Taking Active Care Giver  Nutritional Supplements (FEEDING SUPPLEMENT, NEPRO CARB STEADY,) LIQD [id number] Yes Place 1,120 mLs into feeding tube daily. Alford Highland, MD Taking Active Care Giver  Nutritional Supplements (FEEDING SUPPLEMENT, NEPRO CARB STEADY,) LIQD [id number] Yes Place 1,120 mLs into feeding tube daily. Enedina Finner, MD Taking Active Care Giver  nystatin (MYCOSTATIN/NYSTOP) powder [id number] Yes Apply topically 3 (three) times daily. Alford Highland, MD Taking Active Care Giver  polyvinyl alcohol (LIQUIFILM TEARS) 1.4 % ophthalmic solution [id number] No Place 1 drop into both eyes as needed for dry eyes.  Patient not taking: Reported on 02/23/2023   Alford Highland, MD Not Taking Active Care Giver  traZODone (DESYREL) 50 MG tablet [id number] Yes Take 0.5 tablets (25 mg total) by mouth at bedtime as needed for sleep. [name], MD Taking Active Care Giver  vancomycin [facility]) 1-5 GM/200ML-% SOLN [id number] Yes Inject 200 mLs (1,000 mg total) into the vein Every Tuesday,Thursday,and Saturday with dialysis for 5 days. [name], MD Taking Active   vitamin D3 (CHOLECALCIFEROL) 25 MCG tablet [id number] Yes Place 2 tablets (2,000 Units total) into feeding tube daily. Enedina Finner, MD Taking Active Care Giver  Water For Irrigation, Sterile (FREE WATER) Criss Rosales [id number] Yes Place 50 mLs into feeding tube every 4 (four) hours. Alford Highland, MD Taking Active Care Giver  Med List Note Sharia Reeve, CPhT 12/06/22 1130): [place] [phone]            Home Care and Equipment/Supplies: Were  Home Health Services Ordered?: Yes Name of Home Health Agency:: [facility] [phone]) was previously following patient Has Agency set up a time to come to your home?: Yes First Home Health Visit Date: 02/22/23 Any new equipment or medical supplies ordered?: No  Functional Questionnaire: Do you need assistance with bathing/showering or dressing?: No Do you need assistance with meal preparation?: No Do you need assistance with eating?: No Do you have difficulty maintaining continence: No Do you need assistance with getting out of bed/getting out of a chair/moving?: No Do you have difficulty managing or taking your medications?: No  Follow up appointments reviewed: Date of PCP follow-up appointment?: 02/27/23 Follow-up Provider: [name], MD Do you need transportation to your follow-up appointment?: No (She has transport provided by DSS) Do you understand care options if your condition(s) worsen?: Yes-patient verbalized understanding (Reviewed with Daughter)  SDOH Interventions Today    Flowsheet Row Most Recent Value  SDOH Interventions   Food Insecurity Interventions Intervention Not Indicated  Housing Interventions Intervention Not Indicated  Transportation Interventions Intervention Not Indicated, Patient Resources (Friends/Family), Payor Benefit  Utilities Interventions Intervention Not Indicated       Goals Addressed             This Visit's Progress    TOC Care Plan       Current Barriers:  Chronic Disease Management support and education needs related to ESRD   RNCM Clinical Goal(s):  Patient will work with  the Care Management team over the next 30 days to address Transition of Care Barriers: Medication Management Diet/Nutrition/Food Resources Support at home Provider appointments Home Health services Equipment/DME through collaboration with RN Care manager, provider, and care team.   Interventions: Evaluation of current treatment plan related to   self management and patient's adherence to plan as established by provider  Transitions of Care:  New goal. Doctor Visits  - discussed the importance of doctor visits  Patient Goals/Self-Care Activities: Participate in Transition of Care Program/Attend [facility] scheduled calls Take all medications as prescribed Attend all scheduled provider appointments Call pharmacy for medication refills 3-7 days in advance of running out of medications  Follow Up Plan:  Telephone follow up appointment with care management team member scheduled for:  03/03/23 @ 10:00am  The patient has been provided with contact information for the care management team and has been advised to call with any health related questions or concerns.           Susa Loffler , BSN, RN Care Management Coordinator [place]   [facility] [email or web address] Direct Dial: [phone]

## 2023-02-23 NOTE — Telephone Encounter (Signed)
Medication Refill -  Most Recent Primary Care Visit:  Provider: [name]  Department: [facility]  Visit Type: HOSPITAL FU  Date: 02/11/2023  Medication: AccuChek Test Strips   Has the patient contacted their pharmacy? Yes  (Agent: If yes, when and what did the pharmacy advise?) Contact Provider   Is this the correct pharmacy for this prescription? Yes  This is the patient's preferred pharmacy: [facility] [address]   Has the prescription been filled recently? Yes  Is the patient out of the medication? Yes  Has the patient been seen for an appointment in the last year OR does the patient have an upcoming appointment? Yes  Can we respond through MyChart? No  Agent: Please be advised that Rx refills may take up to 3 business days. We ask that you follow-up with your pharmacy.

## 2023-02-23 NOTE — Telephone Encounter (Signed)
Sent!

## 2023-02-24 IMAGING — MG MM DIGITAL SCREENING BILAT W/ TOMO AND CAD
6 of 9 series · 6 of 25 positions shown · non-contrast
Comparison: Previous exam(s).

CLINICAL DATA: Screening.

EXAM:
DIGITAL SCREENING BILATERAL MAMMOGRAM WITH TOMOSYNTHESIS AND CAD
TECHNIQUE: Bilateral screening digital craniocaudal and mediolateral oblique
mammograms were obtained. Bilateral screening digital breast
tomosynthesis was performed. The images were evaluated with
computer-aided detection.

[L MLO synth-2D (1 of 2)]
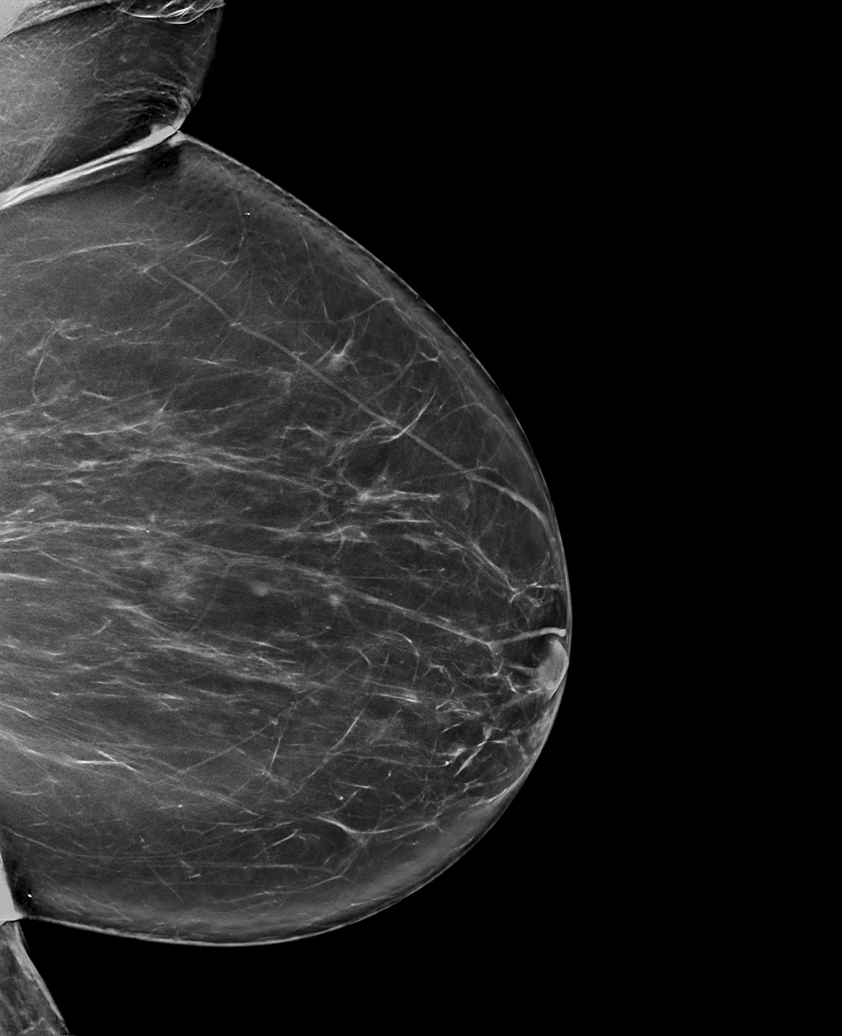

[L MLO synth-2D (2 of 2)]
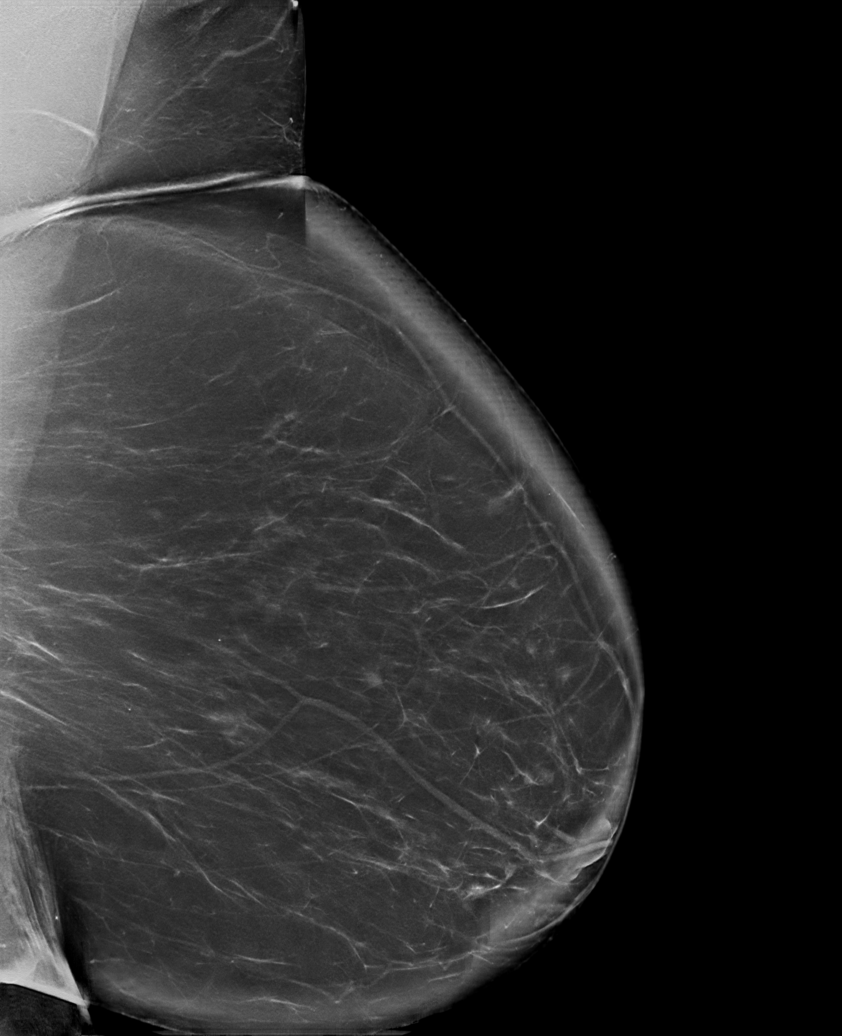

[L CC synth-2D]
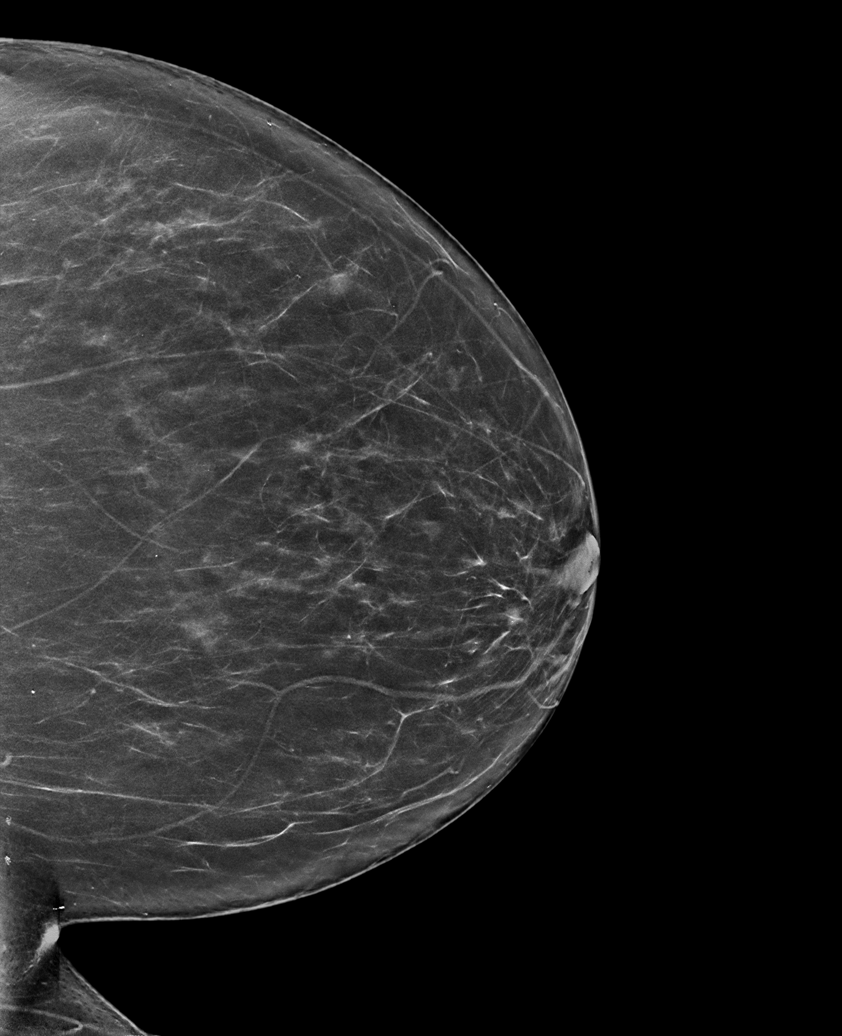

[R MLO synth-2D]
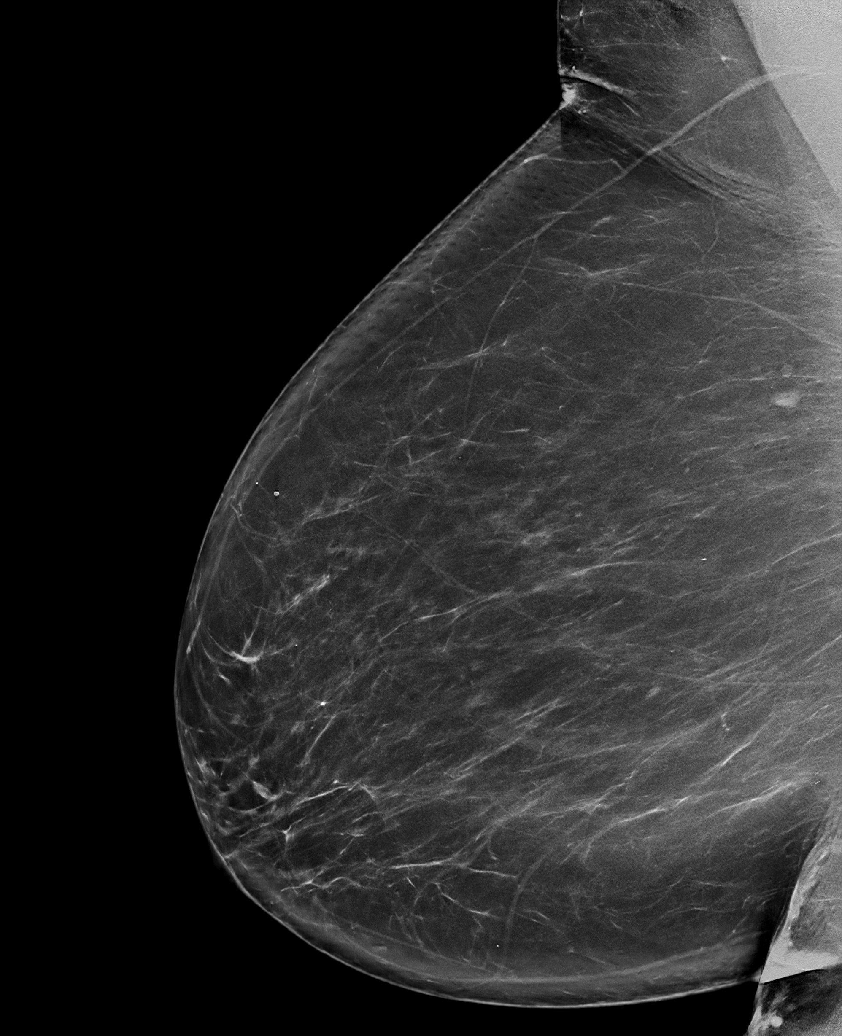

[R CC synth-2D]
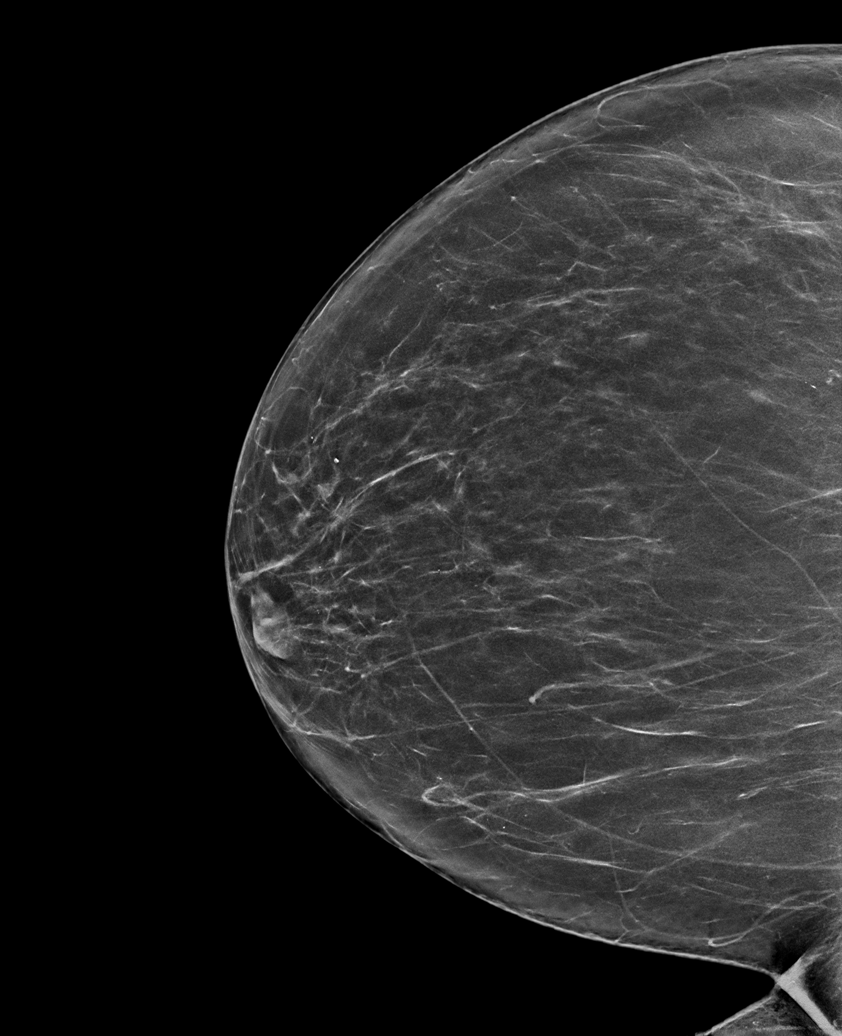

[L MLO tomo · tomo slice 39/78.0]
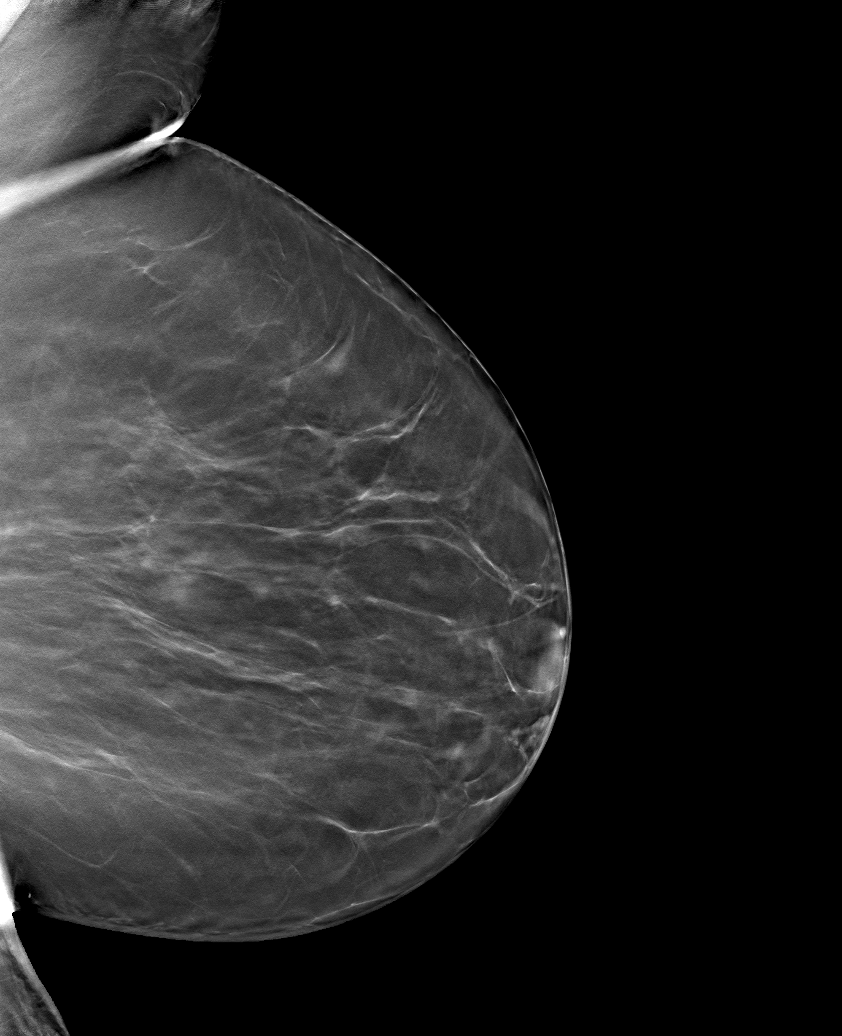

[6 of 25 positions shown; findings below may reference images not displayed]

ACR Breast Density Category b: There are scattered areas of
fibroglandular density.
FINDINGS: There are no findings suspicious for malignancy.
IMPRESSION: No mammographic evidence of malignancy. A result letter of this
screening mammogram will be mailed directly to the patient.

RECOMMENDATION:
Screening mammogram in one year. (Code:51-O-LD2)

BI-RADS CATEGORY  1: Negative.

## 2023-02-24 NOTE — Telephone Encounter (Signed)
Verbal orders given  

## 2023-02-27 ENCOUNTER — Ambulatory Visit (INDEPENDENT_AMBULATORY_CARE_PROVIDER_SITE_OTHER): Payer: Medicare Other | Admitting: Physician Assistant

## 2023-02-27 ENCOUNTER — Encounter: Payer: Self-pay | Admitting: Physician Assistant

## 2023-02-27 ENCOUNTER — Other Ambulatory Visit: Payer: Self-pay

## 2023-02-27 ENCOUNTER — Emergency Department: Payer: Medicare Other

## 2023-02-27 ENCOUNTER — Inpatient Hospital Stay: Payer: Medicare Other

## 2023-02-27 ENCOUNTER — Inpatient Hospital Stay
Admission: EM | Admit: 2023-02-27 | Discharge: 2023-03-08 | DRG: 871 | Disposition: A | Discharge status: Home Health | Payer: Medicare Other | Source: Ambulatory Visit | Attending: Internal Medicine | Admitting: Internal Medicine

## 2023-02-27 VITALS — BP 108/68 | HR 59 | Temp 97.3°F | Resp 16 | Ht 64.0 in | Wt 189.0 lb

## 2023-02-27 DIAGNOSIS — Z931 Gastrostomy status: Secondary | ICD-10-CM | POA: Diagnosis not present

## 2023-02-27 DIAGNOSIS — R0602 Shortness of breath: Secondary | ICD-10-CM

## 2023-02-27 DIAGNOSIS — Z803 Family history of malignant neoplasm of breast: Secondary | ICD-10-CM

## 2023-02-27 DIAGNOSIS — R0989 Other specified symptoms and signs involving the circulatory and respiratory systems: Secondary | ICD-10-CM

## 2023-02-27 DIAGNOSIS — L98411 Non-pressure chronic ulcer of buttock limited to breakdown of skin: Secondary | ICD-10-CM | POA: Diagnosis present

## 2023-02-27 DIAGNOSIS — N2581 Secondary hyperparathyroidism of renal origin: Secondary | ICD-10-CM | POA: Diagnosis present

## 2023-02-27 DIAGNOSIS — Z79899 Other long term (current) drug therapy: Secondary | ICD-10-CM

## 2023-02-27 DIAGNOSIS — E876 Hypokalemia: Secondary | ICD-10-CM | POA: Diagnosis not present

## 2023-02-27 DIAGNOSIS — I953 Hypotension of hemodialysis: Secondary | ICD-10-CM | POA: Diagnosis not present

## 2023-02-27 DIAGNOSIS — I509 Heart failure, unspecified: Secondary | ICD-10-CM | POA: Diagnosis present

## 2023-02-27 DIAGNOSIS — A419 Sepsis, unspecified organism: Secondary | ICD-10-CM

## 2023-02-27 DIAGNOSIS — E2740 Unspecified adrenocortical insufficiency: Secondary | ICD-10-CM | POA: Diagnosis present

## 2023-02-27 DIAGNOSIS — E871 Hypo-osmolality and hyponatremia: Secondary | ICD-10-CM | POA: Diagnosis not present

## 2023-02-27 DIAGNOSIS — E65 Localized adiposity: Secondary | ICD-10-CM | POA: Insufficient documentation

## 2023-02-27 DIAGNOSIS — N186 End stage renal disease: Secondary | ICD-10-CM | POA: Diagnosis not present

## 2023-02-27 DIAGNOSIS — R6521 Severe sepsis with septic shock: Secondary | ICD-10-CM | POA: Diagnosis present

## 2023-02-27 DIAGNOSIS — J690 Pneumonitis due to inhalation of food and vomit: Secondary | ICD-10-CM | POA: Diagnosis present

## 2023-02-27 DIAGNOSIS — I132 Hypertensive heart and chronic kidney disease with heart failure and with stage 5 chronic kidney disease, or end stage renal disease: Secondary | ICD-10-CM | POA: Diagnosis present

## 2023-02-27 DIAGNOSIS — E46 Unspecified protein-calorie malnutrition: Secondary | ICD-10-CM | POA: Diagnosis present

## 2023-02-27 DIAGNOSIS — R197 Diarrhea, unspecified: Secondary | ICD-10-CM | POA: Diagnosis present

## 2023-02-27 DIAGNOSIS — F419 Anxiety disorder, unspecified: Secondary | ICD-10-CM | POA: Diagnosis not present

## 2023-02-27 DIAGNOSIS — D631 Anemia in chronic kidney disease: Secondary | ICD-10-CM | POA: Diagnosis present

## 2023-02-27 DIAGNOSIS — Z515 Encounter for palliative care: Secondary | ICD-10-CM

## 2023-02-27 DIAGNOSIS — Z89612 Acquired absence of left leg above knee: Secondary | ICD-10-CM

## 2023-02-27 DIAGNOSIS — Z8249 Family history of ischemic heart disease and other diseases of the circulatory system: Secondary | ICD-10-CM

## 2023-02-27 DIAGNOSIS — Z8739 Personal history of other diseases of the musculoskeletal system and connective tissue: Secondary | ICD-10-CM

## 2023-02-27 DIAGNOSIS — Z8614 Personal history of Methicillin resistant Staphylococcus aureus infection: Secondary | ICD-10-CM

## 2023-02-27 DIAGNOSIS — Z794 Long term (current) use of insulin: Secondary | ICD-10-CM

## 2023-02-27 DIAGNOSIS — E1122 Type 2 diabetes mellitus with diabetic chronic kidney disease: Secondary | ICD-10-CM | POA: Diagnosis present

## 2023-02-27 DIAGNOSIS — R6520 Severe sepsis without septic shock: Secondary | ICD-10-CM

## 2023-02-27 DIAGNOSIS — Z87440 Personal history of urinary (tract) infections: Secondary | ICD-10-CM

## 2023-02-27 DIAGNOSIS — E1140 Type 2 diabetes mellitus with diabetic neuropathy, unspecified: Secondary | ICD-10-CM | POA: Diagnosis present

## 2023-02-27 DIAGNOSIS — Z7952 Long term (current) use of systemic steroids: Secondary | ICD-10-CM

## 2023-02-27 DIAGNOSIS — Z883 Allergy status to other anti-infective agents status: Secondary | ICD-10-CM

## 2023-02-27 DIAGNOSIS — Z992 Dependence on renal dialysis: Secondary | ICD-10-CM

## 2023-02-27 DIAGNOSIS — J440 Chronic obstructive pulmonary disease with acute lower respiratory infection: Secondary | ICD-10-CM | POA: Diagnosis present

## 2023-02-27 DIAGNOSIS — R0600 Dyspnea, unspecified: Principal | ICD-10-CM | POA: Diagnosis present

## 2023-02-27 DIAGNOSIS — M8580 Other specified disorders of bone density and structure, unspecified site: Secondary | ICD-10-CM | POA: Diagnosis present

## 2023-02-27 DIAGNOSIS — K9423 Gastrostomy malfunction: Secondary | ICD-10-CM | POA: Diagnosis not present

## 2023-02-27 DIAGNOSIS — I480 Paroxysmal atrial fibrillation: Secondary | ICD-10-CM | POA: Diagnosis present

## 2023-02-27 DIAGNOSIS — R1310 Dysphagia, unspecified: Secondary | ICD-10-CM | POA: Diagnosis not present

## 2023-02-27 DIAGNOSIS — Z8701 Personal history of pneumonia (recurrent): Secondary | ICD-10-CM

## 2023-02-27 DIAGNOSIS — K8020 Calculus of gallbladder without cholecystitis without obstruction: Secondary | ICD-10-CM | POA: Diagnosis present

## 2023-02-27 DIAGNOSIS — E441 Mild protein-calorie malnutrition: Secondary | ICD-10-CM | POA: Diagnosis present

## 2023-02-27 DIAGNOSIS — F32A Depression, unspecified: Secondary | ICD-10-CM | POA: Diagnosis present

## 2023-02-27 DIAGNOSIS — E785 Hyperlipidemia, unspecified: Secondary | ICD-10-CM | POA: Diagnosis present

## 2023-02-27 DIAGNOSIS — N200 Calculus of kidney: Secondary | ICD-10-CM | POA: Diagnosis present

## 2023-02-27 DIAGNOSIS — G4733 Obstructive sleep apnea (adult) (pediatric): Secondary | ICD-10-CM | POA: Diagnosis not present

## 2023-02-27 DIAGNOSIS — K7460 Unspecified cirrhosis of liver: Secondary | ICD-10-CM | POA: Diagnosis present

## 2023-02-27 DIAGNOSIS — Z90710 Acquired absence of both cervix and uterus: Secondary | ICD-10-CM

## 2023-02-27 DIAGNOSIS — R0689 Other abnormalities of breathing: Secondary | ICD-10-CM | POA: Diagnosis present

## 2023-02-27 DIAGNOSIS — L089 Local infection of the skin and subcutaneous tissue, unspecified: Secondary | ICD-10-CM | POA: Diagnosis present

## 2023-02-27 DIAGNOSIS — G9341 Metabolic encephalopathy: Secondary | ICD-10-CM

## 2023-02-27 DIAGNOSIS — E669 Obesity, unspecified: Secondary | ICD-10-CM | POA: Diagnosis present

## 2023-02-27 DIAGNOSIS — I9589 Other hypotension: Secondary | ICD-10-CM | POA: Diagnosis present

## 2023-02-27 DIAGNOSIS — J189 Pneumonia, unspecified organism: Secondary | ICD-10-CM | POA: Diagnosis present

## 2023-02-27 DIAGNOSIS — I2510 Atherosclerotic heart disease of native coronary artery without angina pectoris: Secondary | ICD-10-CM | POA: Diagnosis present

## 2023-02-27 DIAGNOSIS — Z7189 Other specified counseling: Secondary | ICD-10-CM | POA: Diagnosis not present

## 2023-02-27 DIAGNOSIS — Z6832 Body mass index (BMI) 32.0-32.9, adult: Secondary | ICD-10-CM

## 2023-02-27 DIAGNOSIS — R0609 Other forms of dyspnea: Secondary | ICD-10-CM | POA: Diagnosis present

## 2023-02-27 DIAGNOSIS — Z87891 Personal history of nicotine dependence: Secondary | ICD-10-CM

## 2023-02-27 DIAGNOSIS — Z811 Family history of alcohol abuse and dependence: Secondary | ICD-10-CM

## 2023-02-27 DIAGNOSIS — K219 Gastro-esophageal reflux disease without esophagitis: Secondary | ICD-10-CM | POA: Diagnosis present

## 2023-02-27 LAB — PROTIME-INR
INR: 1 (ref 0.8–1.2)
Prothrombin Time: 13.5 s (ref 11.4–15.2)

## 2023-02-27 LAB — CBC WITH DIFFERENTIAL/PLATELET
Abs Immature Granulocytes: 0.08 10*3/uL — ABNORMAL HIGH (ref 0.00–0.07)
Basophils Absolute: 0.1 10*3/uL (ref 0.0–0.1)
Basophils Relative: 1 %
Eosinophils Absolute: 3.1 10*3/uL — ABNORMAL HIGH (ref 0.0–0.5)
Eosinophils Relative: 20 %
HCT: 39.8 % (ref 36.0–46.0)
Hemoglobin: 12.4 g/dL (ref 12.0–15.0)
Immature Granulocytes: 1 %
Lymphocytes Relative: 17 %
Lymphs Abs: 2.6 10*3/uL (ref 0.7–4.0)
MCH: 25.7 pg — ABNORMAL LOW (ref 26.0–34.0)
MCHC: 31.2 g/dL (ref 30.0–36.0)
MCV: 82.4 fL (ref 80.0–100.0)
Monocytes Absolute: 1 10*3/uL (ref 0.1–1.0)
Monocytes Relative: 7 %
Neutro Abs: 8.8 10*3/uL — ABNORMAL HIGH (ref 1.7–7.7)
Neutrophils Relative %: 54 %
Platelets: 388 10*3/uL (ref 150–400)
RBC: 4.83 MIL/uL (ref 3.87–5.11)
RDW: 16 % — ABNORMAL HIGH (ref 11.5–15.5)
Smear Review: NORMAL
WBC: 15.7 10*3/uL — ABNORMAL HIGH (ref 4.0–10.5)
nRBC: 0 % (ref 0.0–0.2)

## 2023-02-27 LAB — LACTIC ACID, PLASMA: Lactic Acid, Venous: 1.8 mmol/L (ref 0.5–1.9)

## 2023-02-27 LAB — COMPREHENSIVE METABOLIC PANEL
ALT: 74 U/L — ABNORMAL HIGH (ref 0–44)
AST: 59 U/L — ABNORMAL HIGH (ref 15–41)
Albumin: 3 g/dL — ABNORMAL LOW (ref 3.5–5.0)
Alkaline Phosphatase: 157 U/L — ABNORMAL HIGH (ref 38–126)
Anion gap: 9 (ref 5–15)
BUN: 27 mg/dL — ABNORMAL HIGH (ref 8–23)
CO2: 24 mmol/L (ref 22–32)
Calcium: 9.2 mg/dL (ref 8.9–10.3)
Chloride: 100 mmol/L (ref 98–111)
Creatinine, Ser: 2.3 mg/dL — ABNORMAL HIGH (ref 0.44–1.00)
GFR, Estimated: 22 mL/min — ABNORMAL LOW (ref >60)
Glucose, Bld: 89 mg/dL (ref 70–99)
Potassium: 3.3 mmol/L — ABNORMAL LOW (ref 3.5–5.1)
Sodium: 133 mmol/L — ABNORMAL LOW (ref 135–145)
Total Bilirubin: 0.5 mg/dL (ref <1.2)
Total Protein: 7.3 g/dL (ref 6.5–8.1)

## 2023-02-27 LAB — APTT: aPTT: 30 s (ref 24–36)

## 2023-02-27 LAB — BRAIN NATRIURETIC PEPTIDE: B Natriuretic Peptide: 88.2 pg/mL (ref 0.0–100.0)

## 2023-02-27 LAB — PROCALCITONIN: Procalcitonin: 0.22 ng/mL

## 2023-02-27 MED ORDER — MELATONIN 5 MG PO TABS: 5.0000 mg | ORAL_TABLET | Freq: Every evening | ORAL | Status: DC | PRN

## 2023-02-27 MED ORDER — VANCOMYCIN HCL IN DEXTROSE 1-5 GM/200ML-% IV SOLN
1000.0000 mg | INTRAVENOUS | Status: AC
  Administered 2023-02-27: 1000 mg via INTRAVENOUS
  Filled 2023-02-27: qty 200

## 2023-02-27 MED ORDER — ESCITALOPRAM OXALATE 10 MG PO TABS
10.0000 mg | ORAL_TABLET | Freq: Every day | ORAL | Status: DC
  Administered 2023-02-28 – 2023-03-08 (×9): 10 mg
  Filled 2023-02-27 (×9): qty 1

## 2023-02-27 MED ORDER — FAMOTIDINE 20 MG PO TABS
10.0000 mg | ORAL_TABLET | Freq: Every day | ORAL | Status: DC
  Administered 2023-02-28 – 2023-03-08 (×9): 10 mg
  Filled 2023-02-27 (×9): qty 1

## 2023-02-27 MED ORDER — VITAMIN C 500 MG PO TABS
500.0000 mg | ORAL_TABLET | Freq: Two times a day (BID) | ORAL | Status: DC
  Administered 2023-02-27 – 2023-03-08 (×17): 500 mg
  Filled 2023-02-27 (×18): qty 1

## 2023-02-27 MED ORDER — ZINC OXIDE 40 % EX OINT: TOPICAL_OINTMENT | Freq: Three times a day (TID) | CUTANEOUS | Status: DC | PRN

## 2023-02-27 MED ORDER — METOCLOPRAMIDE HCL 10 MG PO TABS
5.0000 mg | ORAL_TABLET | Freq: Three times a day (TID) | ORAL | Status: DC
  Administered 2023-02-27 – 2023-02-28 (×2): 5 mg via ORAL
  Filled 2023-02-27 (×3): qty 1

## 2023-02-27 MED ORDER — TRAZODONE HCL 50 MG PO TABS
25.0000 mg | ORAL_TABLET | Freq: Every evening | ORAL | Status: DC | PRN
  Administered 2023-02-27: 25 mg via ORAL
  Filled 2023-02-27: qty 1

## 2023-02-27 MED ORDER — CHOLESTYRAMINE 4 G PO PACK
4.0000 g | PACK | Freq: Three times a day (TID) | ORAL | Status: DC
  Administered 2023-02-27 – 2023-03-08 (×22): 4 g
  Filled 2023-02-27 (×27): qty 1

## 2023-02-27 MED ORDER — DOXYCYCLINE HYCLATE 100 MG IV SOLR
100.0000 mg | Freq: Two times a day (BID) | INTRAVENOUS | Status: DC
  Administered 2023-02-27 – 2023-02-28 (×2): 100 mg via INTRAVENOUS
  Filled 2023-02-27 (×3): qty 100

## 2023-02-27 MED ORDER — CLONAZEPAM 0.5 MG PO TABS
0.5000 mg | ORAL_TABLET | Freq: Every day | ORAL | Status: DC | PRN
  Administered 2023-02-27 – 2023-03-08 (×3): 0.5 mg via ORAL
  Filled 2023-02-27 (×4): qty 1

## 2023-02-27 MED ORDER — NEPRO/CARBSTEADY PO LIQD
1120.0000 mL | ORAL | Status: DC
  Administered 2023-02-27: 1120 mL

## 2023-02-27 MED ORDER — ATORVASTATIN CALCIUM 20 MG PO TABS
20.0000 mg | ORAL_TABLET | Freq: Every day | ORAL | Status: DC
  Administered 2023-02-28 – 2023-03-08 (×9): 20 mg
  Filled 2023-02-27 (×9): qty 1

## 2023-02-27 MED ORDER — ONDANSETRON HCL 4 MG/2ML IJ SOLN
4.0000 mg | Freq: Four times a day (QID) | INTRAMUSCULAR | Status: AC | PRN
  Administered 2023-02-28: 4 mg via INTRAVENOUS
  Filled 2023-02-27 (×2): qty 2

## 2023-02-27 MED ORDER — ACETAMINOPHEN 650 MG RE SUPP
650.0000 mg | Freq: Four times a day (QID) | RECTAL | Status: AC | PRN
Start: 2023-02-27 — End: 2023-03-04

## 2023-02-27 MED ORDER — NYSTATIN 100000 UNIT/GM EX POWD
Freq: Three times a day (TID) | CUTANEOUS | Status: DC
  Filled 2023-02-27 (×2): qty 15

## 2023-02-27 MED ORDER — SENNOSIDES-DOCUSATE SODIUM 8.6-50 MG PO TABS: 1.0000 | ORAL_TABLET | Freq: Every evening | ORAL | Status: DC | PRN

## 2023-02-27 MED ORDER — HEPARIN SODIUM (PORCINE) 5000 UNIT/ML IJ SOLN
5000.0000 [IU] | Freq: Three times a day (TID) | INTRAMUSCULAR | Status: DC
  Administered 2023-02-28 – 2023-03-08 (×24): 5000 [IU] via SUBCUTANEOUS
  Filled 2023-02-27 (×24): qty 1

## 2023-02-27 MED ORDER — ONDANSETRON HCL 4 MG PO TABS: 4.0000 mg | ORAL_TABLET | Freq: Four times a day (QID) | ORAL | Status: AC | PRN

## 2023-02-27 MED ORDER — CEFEPIME HCL 2 G IV SOLR
2.0000 g | INTRAVENOUS | Status: AC
  Administered 2023-02-27: 2 g via INTRAVENOUS
  Filled 2023-02-27: qty 12.5

## 2023-02-27 MED ORDER — ACETAMINOPHEN 325 MG PO TABS
650.0000 mg | ORAL_TABLET | Freq: Four times a day (QID) | ORAL | Status: AC | PRN
Start: 2023-02-27 — End: 2023-03-04

## 2023-02-27 MED ORDER — IPRATROPIUM-ALBUTEROL 0.5-2.5 (3) MG/3ML IN SOLN: 3.0000 mL | Freq: Four times a day (QID) | RESPIRATORY_TRACT | Status: AC | PRN

## 2023-02-27 MED ORDER — HYDROXYZINE HCL 50 MG PO TABS
100.0000 mg | ORAL_TABLET | Freq: Two times a day (BID) | ORAL | Status: DC
  Administered 2023-02-27 – 2023-03-08 (×18): 100 mg
  Filled 2023-02-27 (×20): qty 2

## 2023-02-27 MED ORDER — RENA-VITE PO TABS
1.0000 | ORAL_TABLET | Freq: Every day | ORAL | Status: DC
  Administered 2023-02-27 – 2023-03-07 (×8): 1
  Filled 2023-02-27 (×9): qty 1

## 2023-02-27 MED ORDER — MIDODRINE HCL 5 MG PO TABS
20.0000 mg | ORAL_TABLET | Freq: Three times a day (TID) | ORAL | Status: DC
  Administered 2023-02-27 – 2023-03-08 (×23): 20 mg
  Filled 2023-02-27 (×23): qty 4

## 2023-02-27 MED ORDER — METRONIDAZOLE 500 MG/100ML IV SOLN
500.0000 mg | Freq: Two times a day (BID) | INTRAVENOUS | Status: DC
  Administered 2023-02-27 – 2023-02-28 (×2): 500 mg via INTRAVENOUS
  Filled 2023-02-27 (×2): qty 100

## 2023-02-27 MED ORDER — VITAMIN D 25 MCG (1000 UNIT) PO TABS
2000.0000 [IU] | ORAL_TABLET | Freq: Every day | ORAL | Status: DC
  Administered 2023-02-28 – 2023-03-08 (×9): 2000 [IU]
  Filled 2023-02-27 (×9): qty 2

## 2023-02-27 MED ORDER — FLUDROCORTISONE ACETATE 0.1 MG PO TABS
0.2000 mg | ORAL_TABLET | Freq: Every day | ORAL | Status: DC
  Administered 2023-02-28 – 2023-03-08 (×9): 0.2 mg
  Filled 2023-02-27 (×9): qty 2

## 2023-02-27 NOTE — Assessment & Plan Note (Addendum)
For history of severe dysphagia Diet patient has been consulted for resumption/continuation of tube feeds Liver oil-think Desitin 3 times daily as needed for irritation surrounding the tube feed Aspiration precaution

## 2023-02-27 NOTE — Assessment & Plan Note (Addendum)
Home Lexapro 10 mg per tube daily, hydroxyzine 100 mg per 2 twice daily were resumed Home Clonazepam 0.5 mg daily as needed for anxiety resumed

## 2023-02-27 NOTE — H&P (Addendum)
History and Physical   [name] VOZ:[id number] DOB: 1950/12/02 DOA: 02/27/2023  PCP: [name], MD  Outpatient Specialists: Dr. Wyn Quaker, vascular surgery Patient coming from: [facility] via EMS  I have personally briefly reviewed patient's old medical records in [facility] EMR.  Chief Concern: Concerns of sepsis by outpatient provider  HPI: [name] is a 34 female with ESRD on HD (TuThSa), G tube feed, hyperlipidemia, history of left AKA in 2023, depression, anxiety, who presents emergency department for chief concerns of concerns for sepsis.  Vitals in the ED showed temperature of 98.6, respiration rate 23, heart rate 64, blood pressure 108/82, SpO2 99% on room air.  Serum sodium is 133, potassium 3.3, chloride 100, bicarb 24, BUN of 27, serum creatinine of 2.30, EGFR of 22, nonfasting blood glucose 89, WBC 15.7, hemoglobin 12.4, platelets of 388.  Alk phos was elevated at 157.  AST was 59, ALT is 54.  BNP was 88.2.  Lactic acid is 1.2.  Procalcitonin was 0.22.  Blood cultures x 2 were ordered in the ED, pending collection at the time of this dictation.  ED treatment: None -------------------------------  At bedside, patient is able to tell me her name, age, current location, current calendar year. She has difficulty enunciating her words and her daughter states this is normal due to hsitry of prolong tracheostomy for at least 8 months.   Alexandra Foster, daugther who is priamry care giver at bedside: denies fever, nausea, vomiting, diarrhea, blood in her urine. Per daughter, patient denies dysuria.  She denies changes to her sleeping behavior.  Social history: She lives at home with her daughter. She did not smoke, etoh, and recreational drug use.   ROS: Unable to complete as patient is weak and sleepy  ED Course: Discussed with EDP, patient requiring hospitalization for chief concerns of persistent infection.  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)   Hx of AKA (above knee amputation), left (HCC)   Truncal obesity   Assessment and Plan:  * Dyspnea on exertion With increased productive cough Etiology workup in progress Given patient has uptrending leukocytosis of 15.7, with increased tachypnea, cough that is productive, persistent pneumonia cannot be excluded at this time Sepsis cannot be excluded at this time Blood cultures x 2 have been ordered and pending collection Per outpatient documentation, patient has been getting vancomycin with dialysis for prior infection treatment, suspected HD catheter site infection Broad-spectrum antibiotic with cefepime, vancomycin initiated on admission Admit to PCU, inpatient  Per discharging provider on last hospitalization, her source of sepsis was unclear at that time.  ESRD on hemodialysis [facility]) Nephrology has been consulted via staff message time for 02/28/2023 at 07:00 hours  Truncal obesity This complicates overall care and prognosis.   Hx of AKA (above knee amputation), left (HCC) Secondary to infection, etiology was unclear. Patient states she was told it may have been a spider bite  Gastrostomy tube in place [facility]) For history of severe dysphagia Diet patient has been consulted for resumption/continuation of tube feeds Liver oil-think Desitin 3 times daily as needed for irritation surrounding the tube  feed Aspiration precaution  Adrenal insufficiency (HCC) Home midodrine 20 mg 3 times daily and home fludrocortisone 0.2 milligram per tube daily resumed on admission  Dysphagia Aspiration  precaution Incentive spirometry, flutter valve every 2 hours while awake Respiratory care team consulted: Please educate patient on appropriate I-S and flutter valve use. Nursing care order/instruction: Please ensure patient has access to is a spirometry and flutter valve are within reach.  Patient has severe dysphagia and increased risk of aspiration pneumonia. Discussed with daughter at bedside that even when patient is at baseline at home, I recommend daily incentive spirometry and flutter valve use at least twice a day  Anxiety and depression Home Lexapro 10 mg per tube daily, hydroxyzine 100 mg per 2 twice daily were resumed Home Clonazepam 0.5 mg daily as needed for anxiety resumed  OSA (obstructive sleep apnea) Per daughter, patient used to wear CPAP machine at night however after patient was intubated and on tracheostomy, after she was discharged home, she no longer tolerated the CPAP machine and has not been able to wear CPAP at night CPAP was considered however given daughters explanation, CPAP machine was discontinued Discussed with daughter at bedside that patient would need outpatient sleep study for evaluation of nasal CPAP machine/alternative to CPAP mask Daughter endorses understanding and compliance  You are chart reviewed.   DVT prophylaxis: Heparin 5000 units subcutaneous every 8 hours Code Status: Full code Diet: N.p.o., will hold tube feed on admission due to concerns of sepsis Family Communication: Updated daughter, Alexandra Foster at bedside Disposition Plan: Pending clinical course Consults called: Nephrology has been consulted for continuation of hemodialysis on admission Admission status: PCU, inpatient  Past Medical History:  Diagnosis Date   AKI (acute kidney injury) (HCC)    a. 04/2021 in setting of bacteremia/shock.   Arthritis    Asthma    Bacteremia    a. 04/2021 S pyogenes bacteremia in setting of lower ext cellulitis.   COPD (chronic obstructive pulmonary  disease) (HCC)    Diabetes mellitus without complication (HCC)    Endometriosis    GERD (gastroesophageal reflux disease)    History of echocardiogram    a. 07/2013 Echo: EF 55-60%, impaired relaxation, mild TR; b. 04/2021 Echo: EF 50-55%, mild LVH, nl RV fxn, mild BAE, Ao sclerosis w/o stenosis.   Hypertension    Obesity    PAF (paroxysmal atrial fibrillation) (HCC)    a. 04/2021 in setting of septic shock/cellulitis.   Sleep apnea    CPAP   Past Surgical History:  Procedure Laterality Date   ABDOMINAL HYSTERECTOMY     APPLICATION OF WOUND VAC Left 05/17/2021   Procedure: APPLICATION OF WOUND VAC/WOUND VAC EXCHANGE-Matrix Myriad;  Surgeon: Carolan Shiver, MD;  Location: [facility] ORS;  Service: General;  Laterality: Left;   APPLICATION OF WOUND VAC  05/24/2021   Procedure: APPLICATION OF WOUND VAC;  Surgeon: Carolan Shiver, MD;  Location: [facility] ORS;  Service: General;;   APPLICATION OF WOUND VAC  05/10/2021   Procedure: APPLICATION OF WOUND VAC;  Surgeon: Carolan Shiver, MD;  Location: [facility] ORS;  Service: General;;   COLONOSCOPY  10/29/2006   Dr Servando Snare   COLONOSCOPY WITH PROPOFOL N/A 11/05/2016   Procedure: COLONOSCOPY WITH PROPOFOL;  Surgeon: Earline Mayotte, MD;  Location: [facility] ENDOS[facility];  Service: Endoscopy;  Laterality: N/A;   DIALYSIS/PERMA CATHETER INSERTION N/A 12/19/2022   Procedure: DIALYSIS/PERMA CATHETER INSERTION;  Surgeon: Annice Needy, MD;  Location: [facility];  Service: Cardiovascular;  Laterality: N/A;   DIALYSIS/PERMA  CATHETER INSERTION N/A 02/04/2023   Procedure: DIALYSIS/PERMA CATHETER INSERTION;  Surgeon: Annice Needy, MD;  Location: [facility];  Service: Cardiovascular;  Laterality: N/A;   DIALYSIS/PERMA CATHETER INSERTION N/A 02/16/2023   Procedure: DIALYSIS/PERMA CATHETER INSERTION;  Surgeon: Annice Needy, MD;  Location: [facility];  Service: Cardiovascular;  Laterality: N/A;   INCISION AND DRAINAGE OF WOUND Left 05/24/2021    Procedure: IRRIGATION AND DEBRIDEMENT LEFT LEG;  Surgeon: Carolan Shiver, MD;  Location: [facility] ORS;  Service: General;  Laterality: Left;   INCISION AND DRAINAGE OF WOUND Left 05/10/2021   Procedure: IRRIGATION AND DEBRIDEMENT LEFT LEG;  Surgeon: Carolan Shiver, MD;  Location: [facility] OR[facility];  Service: General;  Laterality: Left;   IR FLUORO GUIDE CV LINE RIGHT  06/12/2021   IR PERC TUN PERIT CATH WO PORT S&I /IMAG  06/12/2021   IR REPLC GASTRO/COLONIC TUBE PERCUT W/FLUORO  06/12/2021   IVC FILTER INSERTION N/A 05/29/2021   Procedure: IVC FILTER INSERTION;  Surgeon: Renford Dills, MD;  Location: [facility];  Service: Cardiovascular;  Laterality: N/A;   MINOR GRAFT APPLICATION  05/24/2021   Procedure: Myriad Matrix  APPLICATION;  Surgeon: Carolan Shiver, MD;  Location: [facility] OR[facility];  Service: General;;   NASAL SINUS SURGERY  2002   Dr Chestine Spore   PEG PLACEMENT N/A 05/28/2021   Procedure: PERCUTANEOUS ENDOSCOPIC GASTROSTOMY (PEG) PLACEMENT;  Surgeon: Sung Amabile, DO;  Location: [facility] END[facility];  Service: General;  Laterality: N/A;  TRAVEL CASE   TRACHEOSTOMY TUBE PLACEMENT N/A 05/17/2021   Procedure: TRACHEOSTOMY;  Surgeon: Geanie Logan, MD;  Location: [facility] OR[facility];  Service: ENT;  Laterality: N/A;   WOUND DEBRIDEMENT Left 05/07/2021   Procedure: DEBRIDEMENT WOUND;  Surgeon: Carolan Shiver, MD;  Location: [facility] OR[facility];  Service: General;  Laterality: Left;   Social History:  reports that she quit smoking about 21 years ago. Her smoking use included cigarettes. She started smoking about 61 years ago. She has a 80 pack-year smoking history. She quit smokeless tobacco use about 21 years ago.  Her smokeless tobacco use included snuff. She reports that she does not drink alcohol and does not use drugs.  No Known Allergies Family History  Problem Relation Age of Onset   Congestive Heart Failure Mother    Coronary artery disease Father 68   Breast cancer Sister 24   Heart disease Brother     Varicose Veins Brother    Alcohol abuse Brother    Family history: Family history reviewed and not pertinent.  Prior to Admission medications   Medication Sig Start Date End Date Taking? Authorizing Provider  acetaminophen (TYLENOL) 325 MG tablet Place 2 tablets (650 mg total) into feeding tube every 6 (six) hours as needed for mild pain (or Fever >/= 101). 12/29/22   Alford Highland, MD  amiodarone (PACERONE) 200 MG tablet Hold due to low heart rate, pending outpatient cardiology followup. Patient not taking: Reported on 02/23/2023 02/20/23   [name], MD  ascorbic acid (VITAMIN C) 500 MG tablet Place 1 tablet (500 mg total) into feeding tube 2 (two) times daily. 06/13/21   Erin Fulling, MD  atorvastatin (LIPITOR) 20 MG tablet Place 1 tablet (20 mg total) into feeding tube daily. 02/12/23   [name], MD  cholestyramine Lanetta Inch) 4 g packet Place 1 packet (4 g total) into feeding tube 3 (three) times daily. Patient not taking: Reported on 02/23/2023 02/11/23   [name], MD  clonazePAM (KLONOPIN) 0.5 MG tablet Take 0.5-1 tablets (0.25-0.5  mg total) by mouth daily as needed for anxiety. 02/11/23   [name], MD  diphenoxylate-atropine (LOMOTIL) 2.5-0.025 MG tablet 1 tablet by Per J Tube route 4 (four) times daily as needed for diarrhea or loose stools. Patient not taking: Reported on 02/23/2023 12/29/22   Alford Highland, MD  escitalopram (LEXAPRO) 10 MG tablet Place 1 tablet (10 mg total) into feeding tube daily. 02/11/23   [name], MD  famotidine (PEPCID) 10 MG tablet Place 1 tablet (10 mg total) into feeding tube daily. 02/11/23   [name], MD  fludrocortisone (FLORINEF) 0.1 MG tablet Place 2 tablets (0.2 mg total) into feeding tube daily. 12/30/22   Alford Highland, MD  glucose blood (ACCU-CHEK GUIDE) test strip Use as instructed 02/23/23   [name], MD  hydrOXYzine (ATARAX) 50 MG tablet Take 2 tablets (100 mg total) by mouth 2 (two) times daily.  Home med. Patient not taking: Reported on 02/23/2023 02/20/23   [name], MD  liver oil-zinc oxide (DESITIN) 40 % ointment Apply topically 3 (three) times daily as needed for irritation. Apply to skin around G tube 3 times daily as needed for skin irritation 02/20/23   [name], MD  metoCLOPramide (REGLAN) 5 MG tablet Take 1 tablet (5 mg total) by mouth 3 (three) times daily before meals. 02/11/23   [name], MD  midodrine (PROAMATINE) 10 MG tablet Place 2 tablets (20 mg total) into feeding tube 3 (three) times daily with meals. 02/12/23   [name], MD  multivitamin (RENA-VIT) TABS tablet Place 1 tablet into feeding tube at bedtime. 02/04/23   [name], MD  Nutritional Supplements (FEEDING SUPPLEMENT, NEPRO CARB STEADY,) LIQD Place 1,120 mLs into feeding tube daily. 12/29/22   Alford Highland, MD  Nutritional Supplements (FEEDING SUPPLEMENT, NEPRO CARB STEADY,) LIQD Place 1,120 mLs into feeding tube daily. 01/02/23   Enedina Finner, MD  nystatin (MYCOSTATIN/NYSTOP) powder Apply topically 3 (three) times daily. 12/29/22   Alford Highland, MD  polyvinyl alcohol (LIQUIFILM TEARS) 1.4 % ophthalmic solution Place 1 drop into both eyes as needed for dry eyes. Patient not taking: Reported on 02/23/2023 12/29/22   Alford Highland, MD  traZODone (DESYREL) 50 MG tablet Take 0.5 tablets (25 mg total) by mouth at bedtime as needed for sleep. 02/11/23   [name], MD  vitamin D3 (CHOLECALCIFEROL) 25 MCG tablet Place 2 tablets (2,000 Units total) into feeding tube daily. 01/02/23   Enedina Finner, MD  Water For Irrigation, Sterile (FREE WATER) SOLN Place 50 mLs into feeding tube every 4 (four) hours. 12/29/22   Alford Highland, MD   Physical Exam: Vitals:   02/27/23 1440  BP: 108/82  Pulse: 64  Resp: (!) 23  Temp: 98.6 F (37 C)  TempSrc: Axillary  SpO2: 99%   Constitutional: appears older than chronological age, frail, NAD, sleepy Eyes: PERRL, lids and conjunctivae normal ENMT:  Mucous membranes are moist. Posterior pharynx clear of any exudate or lesions. Age-appropriate dentition. Hearing appropriate Neck: normal, supple, no masses, no thyromegaly Respiratory: clear to auscultation bilaterally, no wheezing, no crackles. Normal respiratory effort. No accessory muscle use.  Cardiovascular: Regular rate and rhythm, no murmurs / rubs / gallops. No extremity edema. 2+ pedal pulses. No carotid bruits.  Abdomen: Morbidly obese abdomen, no tenderness, no masses palpated, no hepatosplenomegaly. Bowel sounds positive.  Tube feeds in place, negative for visual evidence of purulence or discharge at this time.  Desitin surrounding tube feed is present Musculoskeletal: no clubbing / cyanosis.  Left AKA.  Decreased muscle  tone.  Skin: no rashes, lesions, ulcers. No induration Neurologic: Sensation intact. Strength 5/5 in all 4.  Psychiatric: Unable to assess judgment and insight as patient allows daughter to do most of the talking. Alert and oriented x 3.  Depressed mood.  Flat affect  EKG: independently reviewed, showing sinus rhythm with rate of 68, QTc 506  Chest x-ray on Admission: I personally reviewed and I agree with radiologist reading as below.  DG Chest Port 1 View  Result Date: 02/27/2023 CLINICAL DATA:  Possible sepsis.  Coarse crackles noted bilaterally. EXAM: PORTABLE CHEST 1 VIEW COMPARISON:  One-view chest x-ray 02/13/2023 FINDINGS: Patient is rotated to the right. The left IJ catheter was removed. A new right IJ dialysis catheter is in place. The distal tip is at the cavoatrial junction. The heart size is normal. Lung volumes are low. Left basilar airspace opacity is noted. No focal airspace disease is present in the right. No edema or effusion is present. The visualized soft tissues and bony thorax are unremarkable. IMPRESSION: 1. Low lung volumes with left basilar airspace disease concerning for pneumonia. 2. New right IJ dialysis catheter without complicating  features. Electronically Signed   By: Marin Roberts M.D.   On: 02/27/2023 17:52    Labs on Admission: I have personally reviewed following labs  CBC: Recent Labs  Lab 02/27/23 1449  WBC 15.7*  NEUTROABS 8.8*  HGB 12.4  HCT 39.8  MCV 82.4  PLT 388   Basic Metabolic Panel: Recent Labs  Lab 02/27/23 1449  NA 133*  K 3.3*  CL 100  CO2 24  GLUCOSE 89  BUN 27*  CREATININE 2.30*  CALCIUM 9.2   GFR: Estimated Creatinine Clearance: 22.7 mL/min (A) (by C-G formula based on SCr of 2.3 mg/dL (H)).  Liver Function Tests: Recent Labs  Lab 02/27/23 1449  AST 59*  ALT 74*  ALKPHOS 157*  BILITOT 0.5  PROT 7.3  ALBUMIN 3.0*   Coagulation Profile: Recent Labs  Lab 02/27/23 1449  INR 1.0   Urine analysis:    Component Value Date/Time   COLORURINE AMBER (A) 02/13/2023 0629   APPEARANCEUR CLOUDY (A) 02/13/2023 0629   LABSPEC 1.016 02/13/2023 0629   PHURINE 5.0 02/13/2023 0629   GLUCOSEU NEGATIVE 02/13/2023 0629   HGBUR NEGATIVE 02/13/2023 0629   BILIRUBINUR NEGATIVE 02/13/2023 0629   KETONESUR NEGATIVE 02/13/2023 0629   PROTEINUR 30 (A) 02/13/2023 0629   NITRITE NEGATIVE 02/13/2023 0629   LEUKOCYTESUR LARGE (A) 02/13/2023 0629   This document was prepared using Dragon Voice Recognition software and may include unintentional dictation errors.  Dr. Sedalia Muta [facility]  If 7PM-7AM, please contact overnight-coverage provider If 7AM-7PM, please contact day attending provider [email or web address]  02/27/2023, 7:31 PM

## 2023-02-27 NOTE — Assessment & Plan Note (Signed)
Secondary to infection, etiology was unclear. Patient states she was told it may have been a spider bite

## 2023-02-27 NOTE — Progress Notes (Signed)
Established Patient Office Visit  Name: Alexandra Foster   MRN: [id number]    DOB: 21-Nov-1950   Date:02/27/2023  Today's Provider: Jacquelin Hawking, MHS, PA-C Introduced myself to the patient as a PA-C and provided education on APPs in clinical practice.         Subjective  Chief Complaint  Chief Complaint  Patient presents with   Hospitalization Follow-up    HPI  She is here with her Daughter, [name] who is providing majority of HPI information   Unable to complete Hospital follow up due to lack of dc summary from hospital team - unsure of treatment course   She states she was discharged with instructions to have abx therapy on dialysis  She states she is concerned for lingering infection - states she is still coughing and  having to sleep with head elevated almost in upright position to help with breathing and coughing   Per notes from Dr. Thedore Mins from dialysis - she was supposed to have "Vancomycin 1 gm IV with each dialysis x 3 doses. Last dose 02/26/23"  Her daughter states G-tube still looks the same as it did prior to her admission- was told it was fine by hospital team  Her daughter states her energy level is around her normal right now      Patient Active Problem List   Diagnosis Date Noted   Gastrostomy tube in place (HCC) 02/15/2023   Pressure injury of skin 02/15/2023   Septic shock (HCC) 02/14/2023   Aspiration pneumonia (HCC) 02/13/2023   Adrenal insufficiency (HCC) 02/13/2023   Malnutrition (HCC) 02/11/2023   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   Cholelithiasis without cholecystitis 02/11/2023   Bowel and bladder incontinence 02/11/2023   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   Diarrhea 12/28/2022   Anemia of chronic disease 12/24/2022   Anxiety and depression 12/06/2022   Type 2 diabetes mellitus with chronic kidney disease, with long-term current use of insulin (HCC) 12/06/2022   ESRD on hemodialysis (HCC)  12/06/2022   Subdural hemorrhage (HCC) 05/18/2021   Atrial fibrillation and flutter (HCC)    Sepsis (HCC) 05/04/2021   Personal history of COVID-19 07/14/2019   Osteoarthritis of both knees 01/14/2018   Urge incontinence 03/14/2016   Osteopenia 02/13/2016   Allergic rhinitis 04/13/2015   COPD (chronic obstructive pulmonary disease) (HCC) 09/18/2014   Esophagitis, reflux 09/18/2014    Past Surgical History:  Procedure Laterality Date   ABDOMINAL HYSTERECTOMY     APPLICATION OF WOUND VAC Left 05/17/2021   Procedure: APPLICATION OF WOUND VAC/WOUND VAC EXCHANGE-Matrix Myriad;  Surgeon: Carolan Shiver, MD;  Location: [facility];  Service: General;  Laterality: Left;   APPLICATION OF WOUND VAC  05/24/2021   Procedure: APPLICATION OF WOUND VAC;  Surgeon: Carolan Shiver, MD;  Location: [facility];  Service: General;;   APPLICATION OF WOUND VAC  05/10/2021   Procedure: APPLICATION OF WOUND VAC;  Surgeon: Carolan Shiver, MD;  Location: [facility];  Service: General;;   COLONOSCOPY  10/29/2006   Dr Servando Snare   COLONOSCOPY WITH PROPOFOL N/A 11/05/2016   Procedure: COLONOSCOPY WITH PROPOFOL;  Surgeon: Earline Mayotte, MD;  Location: [facility];  Service: Endoscopy;  Laterality: N/A;   DIALYSIS/PERMA CATHETER INSERTION N/A 12/19/2022   Procedure: DIALYSIS/PERMA CATHETER INSERTION;  Surgeon: Annice Needy, MD;  Location: [facility];  Service: Cardiovascular;  Laterality: N/A;   DIALYSIS/PERMA CATHETER INSERTION N/A 02/04/2023   Procedure: DIALYSIS/PERMA CATHETER  INSERTION;  Surgeon: Annice Needy, MD;  Location: [facility];  Service: Cardiovascular;  Laterality: N/A;   DIALYSIS/PERMA CATHETER INSERTION N/A 02/16/2023   Procedure: DIALYSIS/PERMA CATHETER INSERTION;  Surgeon: Annice Needy, MD;  Location: [facility];  Service: Cardiovascular;  Laterality: N/A;   INCISION AND DRAINAGE OF WOUND Left 05/24/2021   Procedure: IRRIGATION AND DEBRIDEMENT LEFT LEG;   Surgeon: Carolan Shiver, MD;  Location: [facility];  Service: General;  Laterality: Left;   INCISION AND DRAINAGE OF WOUND Left 05/10/2021   Procedure: IRRIGATION AND DEBRIDEMENT LEFT LEG;  Surgeon: Carolan Shiver, MD;  Location: [facility];  Service: General;  Laterality: Left;   IR FLUORO GUIDE CV LINE RIGHT  06/12/2021   IR PERC TUN PERIT CATH WO PORT S&I /IMAG  06/12/2021   IR REPLC GASTRO/COLONIC TUBE PERCUT W/FLUORO  06/12/2021   IVC FILTER INSERTION N/A 05/29/2021   Procedure: IVC FILTER INSERTION;  Surgeon: Renford Dills, MD;  Location: [facility];  Service: Cardiovascular;  Laterality: N/A;   MINOR GRAFT APPLICATION  05/24/2021   Procedure: Myriad Matrix  APPLICATION;  Surgeon: Carolan Shiver, MD;  Location: [facility];  Service: General;;   NASAL SINUS SURGERY  2002   Dr Chestine Spore   PEG PLACEMENT N/A 05/28/2021   Procedure: PERCUTANEOUS ENDOSCOPIC GASTROSTOMY (PEG) PLACEMENT;  Surgeon: Sung Amabile, DO;  Location: [facility];  Service: General;  Laterality: N/A;  TRAVEL CASE   TRACHEOSTOMY TUBE PLACEMENT N/A 05/17/2021   Procedure: TRACHEOSTOMY;  Surgeon: Geanie Logan, MD;  Location: [facility];  Service: ENT;  Laterality: N/A;   WOUND DEBRIDEMENT Left 05/07/2021   Procedure: DEBRIDEMENT WOUND;  Surgeon: Carolan Shiver, MD;  Location: [facility];  Service: General;  Laterality: Left;    Family History  Problem Relation Age of Onset   Congestive Heart Failure Mother    Coronary artery disease Father 65   Breast cancer Sister 52   Heart disease Brother    Varicose Veins Brother    Alcohol abuse Brother     Social History   Tobacco Use   Smoking status: Former    Current packs/day: 0.00    Average packs/day: 2.0 packs/day for 40.0 years (80.0 ttl pk-yrs)    Types: Cigarettes    Start date: 1963    Quit date: 2003    Years since quitting: 21.8   Smokeless tobacco: Former    Types: Snuff    Quit date: 04/2001   Tobacco comments:    smoking  cessation materials not required  Substance Use Topics   Alcohol use: No    Alcohol/week: 0.0 standard drinks of alcohol     Current Outpatient Medications:    acetaminophen (TYLENOL) 325 MG tablet, Place 2 tablets (650 mg total) into feeding tube every 6 (six) hours as needed for mild pain (or Fever >/= 101)., Disp: , Rfl:    ascorbic acid (VITAMIN C) 500 MG tablet, Place 1 tablet (500 mg total) into feeding tube 2 (two) times daily., Disp: , Rfl:    atorvastatin (LIPITOR) 20 MG tablet, Place 1 tablet (20 mg total) into feeding tube daily., Disp: 90 tablet, Rfl: 0   clonazePAM (KLONOPIN) 0.5 MG tablet, Take 0.5-1 tablets (0.25-0.5 mg total) by mouth daily as needed for anxiety., Disp: 30 tablet, Rfl: 0   escitalopram (LEXAPRO) 10 MG tablet, Place 1 tablet (10 mg total) into feeding tube daily., Disp: 90 tablet, Rfl: 0   famotidine (PEPCID) 10 MG tablet, Place 1 tablet (10  mg total) into feeding tube daily., Disp: 90 tablet, Rfl: 0   fludrocortisone (FLORINEF) 0.1 MG tablet, Place 2 tablets (0.2 mg total) into feeding tube daily., Disp: 60 tablet, Rfl: 0   glucose blood (ACCU-CHEK GUIDE) test strip, Use as instructed, Disp: 200 each, Rfl: 0   liver oil-zinc oxide (DESITIN) 40 % ointment, Apply topically 3 (three) times daily as needed for irritation. Apply to skin around G tube 3 times daily as needed for skin irritation, Disp: , Rfl:    metoCLOPramide (REGLAN) 5 MG tablet, Take 1 tablet (5 mg total) by mouth 3 (three) times daily before meals., Disp: 90 tablet, Rfl: 0   midodrine (PROAMATINE) 10 MG tablet, Place 2 tablets (20 mg total) into feeding tube 3 (three) times daily with meals., Disp: 180 tablet, Rfl: 0   multivitamin (RENA-VIT) TABS tablet, Place 1 tablet into feeding tube at bedtime., Disp: 30 tablet, Rfl: 0   Nutritional Supplements (FEEDING SUPPLEMENT, NEPRO CARB STEADY,) LIQD, Place 1,120 mLs into feeding tube daily., Disp: , Rfl:    Nutritional Supplements (FEEDING SUPPLEMENT,  NEPRO CARB STEADY,) LIQD, Place 1,120 mLs into feeding tube daily., Disp: 1000 mL, Rfl: 0   nystatin (MYCOSTATIN/NYSTOP) powder, Apply topically 3 (three) times daily., Disp: 56 g, Rfl: 2   traZODone (DESYREL) 50 MG tablet, Take 0.5 tablets (25 mg total) by mouth at bedtime as needed for sleep., Disp: 45 tablet, Rfl: 0   vitamin D3 (CHOLECALCIFEROL) 25 MCG tablet, Place 2 tablets (2,000 Units total) into feeding tube daily., Disp: 60 tablet, Rfl: 0   Water For Irrigation, Sterile (FREE WATER) SOLN, Place 50 mLs into feeding tube every 4 (four) hours., Disp: , Rfl:    amiodarone (PACERONE) 200 MG tablet, Hold due to low heart rate, pending outpatient cardiology followup. (Patient not taking: Reported on 02/23/2023), Disp: , Rfl:    cholestyramine (QUESTRAN) 4 g packet, Place 1 packet (4 g total) into feeding tube 3 (three) times daily. (Patient not taking: Reported on 02/23/2023), Disp: 90 each, Rfl: 0   diphenoxylate-atropine (LOMOTIL) 2.5-0.025 MG tablet, 1 tablet by Per J Tube route 4 (four) times daily as needed for diarrhea or loose stools. (Patient not taking: Reported on 02/23/2023), Disp: 120 tablet, Rfl: 0   hydrOXYzine (ATARAX) 50 MG tablet, Take 2 tablets (100 mg total) by mouth 2 (two) times daily. Home med. (Patient not taking: Reported on 02/23/2023), Disp: , Rfl:    polyvinyl alcohol (LIQUIFILM TEARS) 1.4 % ophthalmic solution, Place 1 drop into both eyes as needed for dry eyes. (Patient not taking: Reported on 02/23/2023), Disp: 15 mL, Rfl: 0  No Known Allergies  I personally reviewed active problem list, medication list, allergies, notes from last encounter, notes from last 2 encounters, lab results, imaging with the patient/caregiver today.   Review of Systems  Constitutional:  Positive for chills and diaphoresis.  Respiratory:  Positive for cough, sputum production and shortness of breath.       Objective  Vitals:   02/27/23 1248  BP: 108/68  Pulse: (!) 59  Resp: 16   Temp: (!) 97.3 F (36.3 C)  TempSrc: Temporal  SpO2: 93%  Weight: 189 lb (85.7 kg)  Height: 5\' 4"  (1.626 m)    Body mass index is 32.44 kg/m.  Physical Exam Constitutional:      General: She is awake.     Appearance: She is well-developed and well-groomed. She is ill-appearing and diaphoretic.  Cardiovascular:     Rate and Rhythm: Normal rate and  regular rhythm.     Heart sounds: Normal heart sounds.  Pulmonary:     Effort: Tachypnea present.     Breath sounds: Decreased air movement present. Examination of the right-middle field reveals decreased breath sounds. Examination of the right-lower field reveals decreased breath sounds. Examination of the left-lower field reveals decreased breath sounds. Decreased breath sounds present. No wheezing, rhonchi or rales.  Musculoskeletal:     Cervical back: Normal range of motion.  Neurological:     Mental Status: She is alert.     Motor: Weakness and abnormal muscle tone present.  Psychiatric:        Attention and Perception: Attention normal.        Mood and Affect: Affect is flat.        Speech: Speech is slurred.        Behavior: Behavior normal. Behavior is cooperative.         PHQ2/9:    02/27/2023   12:48 PM 02/11/2023    2:39 PM 04/30/2021    8:05 AM 02/11/2021    8:53 AM 08/24/2020    3:22 PM  Depression screen PHQ 2/9  Decreased Interest 1 2 0 0 0  Down, Depressed, Hopeless 1 2 0 0 0  PHQ - 2 Score 2 4 0 0 0  Altered sleeping 1 3     Tired, decreased energy 1 3     Change in appetite 1 0     Feeling bad or failure about yourself  0 2     Trouble concentrating 0 0     Moving slowly or fidgety/restless 0 0     Suicidal thoughts 0 0     PHQ-9 Score 5 12     Difficult doing work/chores Somewhat difficult Somewhat difficult         Fall Risk:    02/27/2023   12:48 PM 02/11/2023    1:49 PM 04/30/2021    8:04 AM 02/11/2021    8:53 AM 08/24/2020    3:22 PM  Fall Risk   Falls in the past year? 1 Exclusion -  non ambulatory 0 0 0  Number falls in past yr: 1   0 0  Injury with Fall? 1   0 0  Risk for fall due to : Impaired balance/gait;History of fall(s)      Follow up Falls prevention discussed;Education provided;Falls evaluation completed Falls prevention discussed;Education provided;Falls evaluation completed Falls prevention discussed Falls evaluation completed Falls evaluation completed      Functional Status Survey: Is the patient deaf or have difficulty hearing?: Yes Does the patient have difficulty seeing, even when wearing glasses/contacts?: Yes Does the patient have difficulty concentrating, remembering, or making decisions?: Yes Does the patient have difficulty walking or climbing stairs?: Yes Does the patient have difficulty dressing or bathing?: Yes Does the patient have difficulty doing errands alone such as visiting a doctor's office or shopping?: Yes    Assessment & Plan  Problem List Items Addressed This Visit       Other   Sepsis (HCC)    Acute, new concern, potentially ongoing Patient was previously hospitalized from 11/1-11/8 for sepsis with encephalitis.  Unable to see discharge summary from this hospitalization.  Records requested but note does not appear finished.  Unable to complete hospital follow-up visit today. Per chart review patient has completed IV vancomycin course that was provided during dialysis treatments on 02/26/2023. Today I am concerned that she still has lingering infection given her lung sounds on  auscultation and overall presentation today in office Will send to ED for further evaluation and workup      Other Visit Diagnoses     SOB (shortness of breath)    -  Primary   Decreased breath sounds of both lungs          Acute, likely ongoing Patient was recently hospitalized for sepsis (unsure of source of infection as hospitalization charts are not available as of today-reviewing her intake charts from 02/13/2023 it appears that she was  diagnosed with pneumonia, cystitis.  She also has permacath and G-tube which could also be sources for infection) She presents today with diaphoresis, tachypnea, shortness of breath along with notable decreased breath sounds bilaterally during appointment today She should be finishing a course of IV vancomycin from her most recent hospital stay which is concerning given her current symptoms as I am not sure that her infection has been adequately treated Discussion was had with patient and her daughter about sending her back to the emergency room for evaluation and potential admission given her overall health status and recent hospitalization for sepsis. Initially patient did not wish to go to the hospital.  I asked her PCP Dr. Carlynn Purl to come in and evaluate her as well and discuss potential treatment options. Dr. Carlynn Purl is in agreement the patient should go to the hospital for evaluation.  After presenting patient with these options patient was amenable to being taken via EMS to ED. EMS was called and patient was transported to [facility] Recommend follow-up after discharge for further evaluation and ongoing management  No follow-ups on file.   I, Elene Downum E Karisha Marlin, PA-C, have reviewed all documentation for this visit. The documentation on 02/27/23 for the exam, diagnosis, procedures, and orders are all accurate and complete.   Jacquelin Hawking, MHS, PA-C [facility]

## 2023-02-27 NOTE — ED Provider Notes (Signed)
[facility] Provider Note    Event Date/Time   First MD Initiated Contact with Patient 02/27/23 1539     (approximate)   History   Shortness of Breath   HPI  Alexandra Foster is a 72 y.o. female end-stage renal disease history of recent pneumonia.  Currently on dialysis had hemodialysis yesterday  Has had ongoing fatigue some cough generalized weakness.  Feels slightly short of breath.  Feels like her pneumonia never really got better.  I saw her primary care doctor today who is concerned that she had findings on examination including slightly reduced oxygen level about 93% which is below her baseline and crackles are sounded like she had pneumonia in her lower lungs according to her daughter.  Sent here for evaluation for consideration that she may have ongoing pneumonia    Attempted to review the patient's discharge summary from November 8 but is not yet complete. It is noted though that the pending diagnosis is sepsis pneumonia septic shock   Physical Exam   Triage Vital Signs: ED Triage Vitals  Encounter Vitals Group     BP 02/27/23 1440 108/82     Systolic BP Percentile --      Diastolic BP Percentile --      Pulse Rate 02/27/23 1440 64     Resp 02/27/23 1440 (!) 23     Temp 02/27/23 1440 98.6 F (37 C)     Temp Source 02/27/23 1440 Axillary     SpO2 02/27/23 1440 99 %     Weight --      Height --      Head Circumference --      Peak Flow --      Pain Score 02/27/23 1445 0     Pain Loc --      Pain Education --      Exclude from Growth Chart --     Most recent vital signs: Vitals:   02/27/23 1440  BP: 108/82  Pulse: 64  Resp: (!) 23  Temp: 98.6 F (37 C)  SpO2: 99%     General: Awake, no distress.  Appears a fatigued but in no acute distress CV:  Good peripheral perfusion.  Normal tones and rate Resp:  Normal effort for very slight tachypnea.  She is crackles notably in the right lower base slight in the left lower.   Upper lung fields are clear bilaterally. Abd:  No distention.  Other:  Left lower extremity previous amputation.  Right lower extremity no obvious evidence of acute edema venous cords congestion or thigh or calf tenderness.   ED Results / Procedures / Treatments   Labs (all labs ordered are listed, but only abnormal results are displayed) Labs Reviewed  COMPREHENSIVE METABOLIC PANEL - Abnormal; Notable for the following components:      Result Value   Sodium 133 (*)    Potassium 3.3 (*)    BUN 27 (*)    Creatinine, Ser 2.30 (*)    Albumin 3.0 (*)    AST 59 (*)    ALT 74 (*)    Alkaline Phosphatase 157 (*)    GFR, Estimated 22 (*)    All other components within normal limits  CBC WITH DIFFERENTIAL/PLATELET - Abnormal; Notable for the following components:   WBC 15.7 (*)    MCH 25.7 (*)    RDW 16.0 (*)    Neutro Abs 8.8 (*)    Eosinophils Absolute 3.1 (*)    Abs  Immature Granulocytes 0.08 (*)    All other components within normal limits  CULTURE, BLOOD (ROUTINE X 2)  CULTURE, BLOOD (ROUTINE X 2)  LACTIC ACID, PLASMA  PROTIME-INR  APTT  BRAIN NATRIURETIC PEPTIDE  PROCALCITONIN  URINALYSIS, W/ REFLEX TO CULTURE (INFECTION SUSPECTED)  CORTISOL-AM, BLOOD  PROCALCITONIN  BASIC METABOLIC PANEL  CBC     EKG  And interpreted by me at 1500 heart rate 70 QRS 90 QTc 500 Normal sinus rhythm, no evidence of frank acute ischemia   RADIOLOGY  Chest x-ray interpreted by me as concerning for possible small bibasilar infiltrates left greater than right   PROCEDURES:  Critical Care performed: No  Procedures   MEDICATIONS ORDERED IN ED: Medications  acetaminophen (TYLENOL) tablet 650 mg (has no administration in time range)    Or  acetaminophen (TYLENOL) suppository 650 mg (has no administration in time range)  ondansetron (ZOFRAN) tablet 4 mg (has no administration in time range)    Or  ondansetron (ZOFRAN) injection 4 mg (has no administration in time range)   heparin injection 5,000 Units (has no administration in time range)  metroNIDAZOLE (FLAGYL) IVPB 500 mg (has no administration in time range)  senna-docusate (Senokot-S) tablet 1 tablet (has no administration in time range)  vancomycin (VANCOCIN) IVPB 1000 mg/200 mL premix (has no administration in time range)  doxycycline (VIBRAMYCIN) 100 mg in dextrose 5 % 250 mL IVPB (has no administration in time range)  atorvastatin (LIPITOR) tablet 20 mg (has no administration in time range)  midodrine (PROAMATINE) tablet 20 mg (has no administration in time range)  escitalopram (LEXAPRO) tablet 10 mg (has no administration in time range)  hydrOXYzine (ATARAX) tablet 100 mg (has no administration in time range)  traZODone (DESYREL) tablet 25 mg (has no administration in time range)  fludrocortisone (FLORINEF) tablet 0.2 mg (has no administration in time range)  metoCLOPramide (REGLAN) tablet 5 mg (has no administration in time range)  multivitamin (RENA-VIT) tablet 1 tablet (has no administration in time range)  ascorbic acid (VITAMIN C) tablet 500 mg (has no administration in time range)  clonazePAM (KLONOPIN) tablet 0.5 mg (has no administration in time range)  cholecalciferol (VITAMIN D3) 25 MCG (1000 UNIT) tablet 2,000 Units (has no administration in time range)  cholestyramine (QUESTRAN) packet 4 g (has no administration in time range)  famotidine (PEPCID) tablet 10 mg (has no administration in time range)  feeding supplement (NEPRO CARB STEADY) liquid 1,120 mL (has no administration in time range)  liver oil-zinc oxide (DESITIN) 40 % ointment (has no administration in time range)  nystatin (MYCOSTATIN/NYSTOP) topical powder (has no administration in time range)  ipratropium-albuterol (DUONEB) 0.5-2.5 (3) MG/3ML nebulizer solution 3 mL (has no administration in time range)  ceFEPIme (MAXIPIME) 2 g in sodium chloride 0.9 % 100 mL IVPB (0 g Intravenous Stopped 02/27/23 1925)     IMPRESSION / MDM  / ASSESSMENT AND PLAN / ED COURSE  I reviewed the triage vital signs and the nursing notes.                              Differential diagnosis includes, but is not limited to, recurrent pneumonia, pneumothorax, fluid overload, pulmonary edema, ACS though this seems unlikely with no associated chest pain and given the degree of description of symptoms, DVT PE.  Likelihood of PE seems quite low the patient has IVC filter in place.  Other items also considered.  Primary concern is elevating white  count, productive cough, fatigue, mild dyspnea as well as Rales on examination in the right base suspicious for pneumonia  Patient's presentation is most consistent with acute complicated illness / injury requiring diagnostic workup.  Patient has IVC filter, making the likelihood of PE low.   The patient is on the cardiac monitor to evaluate for evidence of arrhythmia and/or significant heart rate changes.   Discussed in consult with her hospitalist Dr. Sedalia Muta.  I am somewhat questionable as to whether the patient would have recurring pneumonia.  It is not 100% certain to me, but some factors such as elevated white count and cough are concerning.  However her procalcitonin is normal.  The hospitalist, Dr. Sedalia Muta, discussed with me and she will see and evaluate and consideration for initiating antibiotics will be given.  At this point I do not have a clear picture that this is pneumonia but I do suspect it.  She does not show any hemodynamic instability or evidence to suggest severe sepsis or septic shock.  Will defer to hospitalist for decision as to initiation of an choice of antibiotic.  Patient and family understand agreeable plan for admission     FINAL CLINICAL IMPRESSION(S) / ED DIAGNOSES   Final diagnoses:  Dyspnea, unspecified type     Rx / DC Orders   ED Discharge Orders     None        Note:  This document was prepared using Dragon voice recognition software and may include  unintentional dictation errors.   Sharyn Creamer, MD 02/27/23 [phone]

## 2023-02-27 NOTE — Assessment & Plan Note (Signed)
Aspiration precaution Incentive spirometry, flutter valve every 2 hours while awake Respiratory care team consulted: Please educate patient on appropriate I-S and flutter valve use. Nursing care order/instruction: Please ensure patient has access to is a spirometry and flutter valve are within reach.  Patient has severe dysphagia and increased risk of aspiration pneumonia. Discussed with daughter at bedside that even when patient is at baseline at home, I recommend daily incentive spirometry and flutter valve use at least twice a day

## 2023-02-27 NOTE — H&P (View-Only) (Signed)
 Established Patient Office Visit  Name: Alexandra Foster   MRN: [id number]    DOB: 21-Nov-1950   Date:02/27/2023  Today's Provider: Jacquelin Hawking, MHS, PA-C Introduced myself to the patient as a PA-C and provided education on APPs in clinical practice.         Subjective  Chief Complaint  Chief Complaint  Patient presents with   Hospitalization Follow-up    HPI  She is here with her Daughter, [name] who is providing majority of HPI information   Unable to complete Hospital follow up due to lack of dc summary from hospital team - unsure of treatment course   She states she was discharged with instructions to have abx therapy on dialysis  She states she is concerned for lingering infection - states she is still coughing and  having to sleep with head elevated almost in upright position to help with breathing and coughing   Per notes from Dr. Thedore Mins from dialysis - she was supposed to have "Vancomycin 1 gm IV with each dialysis x 3 doses. Last dose 02/26/23"  Her daughter states G-tube still looks the same as it did prior to her admission- was told it was fine by hospital team  Her daughter states her energy level is around her normal right now      Patient Active Problem List   Diagnosis Date Noted   Gastrostomy tube in place (HCC) 02/15/2023   Pressure injury of skin 02/15/2023   Septic shock (HCC) 02/14/2023   Aspiration pneumonia (HCC) 02/13/2023   Adrenal insufficiency (HCC) 02/13/2023   Malnutrition (HCC) 02/11/2023   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   Cholelithiasis without cholecystitis 02/11/2023   Bowel and bladder incontinence 02/11/2023   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   Diarrhea 12/28/2022   Anemia of chronic disease 12/24/2022   Anxiety and depression 12/06/2022   Type 2 diabetes mellitus with chronic kidney disease, with long-term current use of insulin (HCC) 12/06/2022   ESRD on hemodialysis (HCC)  12/06/2022   Subdural hemorrhage (HCC) 05/18/2021   Atrial fibrillation and flutter (HCC)    Sepsis (HCC) 05/04/2021   Personal history of COVID-19 07/14/2019   Osteoarthritis of both knees 01/14/2018   Urge incontinence 03/14/2016   Osteopenia 02/13/2016   Allergic rhinitis 04/13/2015   COPD (chronic obstructive pulmonary disease) (HCC) 09/18/2014   Esophagitis, reflux 09/18/2014    Past Surgical History:  Procedure Laterality Date   ABDOMINAL HYSTERECTOMY     APPLICATION OF WOUND VAC Left 05/17/2021   Procedure: APPLICATION OF WOUND VAC/WOUND VAC EXCHANGE-Matrix Myriad;  Surgeon: Carolan Shiver, MD;  Location: [facility];  Service: General;  Laterality: Left;   APPLICATION OF WOUND VAC  05/24/2021   Procedure: APPLICATION OF WOUND VAC;  Surgeon: Carolan Shiver, MD;  Location: [facility];  Service: General;;   APPLICATION OF WOUND VAC  05/10/2021   Procedure: APPLICATION OF WOUND VAC;  Surgeon: Carolan Shiver, MD;  Location: [facility];  Service: General;;   COLONOSCOPY  10/29/2006   Dr Servando Snare   COLONOSCOPY WITH PROPOFOL N/A 11/05/2016   Procedure: COLONOSCOPY WITH PROPOFOL;  Surgeon: Earline Mayotte, MD;  Location: [facility];  Service: Endoscopy;  Laterality: N/A;   DIALYSIS/PERMA CATHETER INSERTION N/A 12/19/2022   Procedure: DIALYSIS/PERMA CATHETER INSERTION;  Surgeon: Annice Needy, MD;  Location: [facility];  Service: Cardiovascular;  Laterality: N/A;   DIALYSIS/PERMA CATHETER INSERTION N/A 02/04/2023   Procedure: DIALYSIS/PERMA CATHETER  INSERTION;  Surgeon: Annice Needy, MD;  Location: [facility];  Service: Cardiovascular;  Laterality: N/A;   DIALYSIS/PERMA CATHETER INSERTION N/A 02/16/2023   Procedure: DIALYSIS/PERMA CATHETER INSERTION;  Surgeon: Annice Needy, MD;  Location: [facility];  Service: Cardiovascular;  Laterality: N/A;   INCISION AND DRAINAGE OF WOUND Left 05/24/2021   Procedure: IRRIGATION AND DEBRIDEMENT LEFT LEG;   Surgeon: Carolan Shiver, MD;  Location: [facility];  Service: General;  Laterality: Left;   INCISION AND DRAINAGE OF WOUND Left 05/10/2021   Procedure: IRRIGATION AND DEBRIDEMENT LEFT LEG;  Surgeon: Carolan Shiver, MD;  Location: [facility];  Service: General;  Laterality: Left;   IR FLUORO GUIDE CV LINE RIGHT  06/12/2021   IR PERC TUN PERIT CATH WO PORT S&I /IMAG  06/12/2021   IR REPLC GASTRO/COLONIC TUBE PERCUT W/FLUORO  06/12/2021   IVC FILTER INSERTION N/A 05/29/2021   Procedure: IVC FILTER INSERTION;  Surgeon: Renford Dills, MD;  Location: [facility];  Service: Cardiovascular;  Laterality: N/A;   MINOR GRAFT APPLICATION  05/24/2021   Procedure: Myriad Matrix  APPLICATION;  Surgeon: Carolan Shiver, MD;  Location: [facility];  Service: General;;   NASAL SINUS SURGERY  2002   Dr Chestine Spore   PEG PLACEMENT N/A 05/28/2021   Procedure: PERCUTANEOUS ENDOSCOPIC GASTROSTOMY (PEG) PLACEMENT;  Surgeon: Sung Amabile, DO;  Location: [facility];  Service: General;  Laterality: N/A;  TRAVEL CASE   TRACHEOSTOMY TUBE PLACEMENT N/A 05/17/2021   Procedure: TRACHEOSTOMY;  Surgeon: Geanie Logan, MD;  Location: [facility];  Service: ENT;  Laterality: N/A;   WOUND DEBRIDEMENT Left 05/07/2021   Procedure: DEBRIDEMENT WOUND;  Surgeon: Carolan Shiver, MD;  Location: [facility];  Service: General;  Laterality: Left;    Family History  Problem Relation Age of Onset   Congestive Heart Failure Mother    Coronary artery disease Father 65   Breast cancer Sister 52   Heart disease Brother    Varicose Veins Brother    Alcohol abuse Brother     Social History   Tobacco Use   Smoking status: Former    Current packs/day: 0.00    Average packs/day: 2.0 packs/day for 40.0 years (80.0 ttl pk-yrs)    Types: Cigarettes    Start date: 1963    Quit date: 2003    Years since quitting: 21.8   Smokeless tobacco: Former    Types: Snuff    Quit date: 04/2001   Tobacco comments:    smoking  cessation materials not required  Substance Use Topics   Alcohol use: No    Alcohol/week: 0.0 standard drinks of alcohol     Current Outpatient Medications:    acetaminophen (TYLENOL) 325 MG tablet, Place 2 tablets (650 mg total) into feeding tube every 6 (six) hours as needed for mild pain (or Fever >/= 101)., Disp: , Rfl:    ascorbic acid (VITAMIN C) 500 MG tablet, Place 1 tablet (500 mg total) into feeding tube 2 (two) times daily., Disp: , Rfl:    atorvastatin (LIPITOR) 20 MG tablet, Place 1 tablet (20 mg total) into feeding tube daily., Disp: 90 tablet, Rfl: 0   clonazePAM (KLONOPIN) 0.5 MG tablet, Take 0.5-1 tablets (0.25-0.5 mg total) by mouth daily as needed for anxiety., Disp: 30 tablet, Rfl: 0   escitalopram (LEXAPRO) 10 MG tablet, Place 1 tablet (10 mg total) into feeding tube daily., Disp: 90 tablet, Rfl: 0   famotidine (PEPCID) 10 MG tablet, Place 1 tablet (10  mg total) into feeding tube daily., Disp: 90 tablet, Rfl: 0   fludrocortisone (FLORINEF) 0.1 MG tablet, Place 2 tablets (0.2 mg total) into feeding tube daily., Disp: 60 tablet, Rfl: 0   glucose blood (ACCU-CHEK GUIDE) test strip, Use as instructed, Disp: 200 each, Rfl: 0   liver oil-zinc oxide (DESITIN) 40 % ointment, Apply topically 3 (three) times daily as needed for irritation. Apply to skin around G tube 3 times daily as needed for skin irritation, Disp: , Rfl:    metoCLOPramide (REGLAN) 5 MG tablet, Take 1 tablet (5 mg total) by mouth 3 (three) times daily before meals., Disp: 90 tablet, Rfl: 0   midodrine (PROAMATINE) 10 MG tablet, Place 2 tablets (20 mg total) into feeding tube 3 (three) times daily with meals., Disp: 180 tablet, Rfl: 0   multivitamin (RENA-VIT) TABS tablet, Place 1 tablet into feeding tube at bedtime., Disp: 30 tablet, Rfl: 0   Nutritional Supplements (FEEDING SUPPLEMENT, NEPRO CARB STEADY,) LIQD, Place 1,120 mLs into feeding tube daily., Disp: , Rfl:    Nutritional Supplements (FEEDING SUPPLEMENT,  NEPRO CARB STEADY,) LIQD, Place 1,120 mLs into feeding tube daily., Disp: 1000 mL, Rfl: 0   nystatin (MYCOSTATIN/NYSTOP) powder, Apply topically 3 (three) times daily., Disp: 56 g, Rfl: 2   traZODone (DESYREL) 50 MG tablet, Take 0.5 tablets (25 mg total) by mouth at bedtime as needed for sleep., Disp: 45 tablet, Rfl: 0   vitamin D3 (CHOLECALCIFEROL) 25 MCG tablet, Place 2 tablets (2,000 Units total) into feeding tube daily., Disp: 60 tablet, Rfl: 0   Water For Irrigation, Sterile (FREE WATER) SOLN, Place 50 mLs into feeding tube every 4 (four) hours., Disp: , Rfl:    amiodarone (PACERONE) 200 MG tablet, Hold due to low heart rate, pending outpatient cardiology followup. (Patient not taking: Reported on 02/23/2023), Disp: , Rfl:    cholestyramine (QUESTRAN) 4 g packet, Place 1 packet (4 g total) into feeding tube 3 (three) times daily. (Patient not taking: Reported on 02/23/2023), Disp: 90 each, Rfl: 0   diphenoxylate-atropine (LOMOTIL) 2.5-0.025 MG tablet, 1 tablet by Per J Tube route 4 (four) times daily as needed for diarrhea or loose stools. (Patient not taking: Reported on 02/23/2023), Disp: 120 tablet, Rfl: 0   hydrOXYzine (ATARAX) 50 MG tablet, Take 2 tablets (100 mg total) by mouth 2 (two) times daily. Home med. (Patient not taking: Reported on 02/23/2023), Disp: , Rfl:    polyvinyl alcohol (LIQUIFILM TEARS) 1.4 % ophthalmic solution, Place 1 drop into both eyes as needed for dry eyes. (Patient not taking: Reported on 02/23/2023), Disp: 15 mL, Rfl: 0  No Known Allergies  I personally reviewed active problem list, medication list, allergies, notes from last encounter, notes from last 2 encounters, lab results, imaging with the patient/caregiver today.   Review of Systems  Constitutional:  Positive for chills and diaphoresis.  Respiratory:  Positive for cough, sputum production and shortness of breath.       Objective  Vitals:   02/27/23 1248  BP: 108/68  Pulse: (!) 59  Resp: 16   Temp: (!) 97.3 F (36.3 C)  TempSrc: Temporal  SpO2: 93%  Weight: 189 lb (85.7 kg)  Height: 5\' 4"  (1.626 m)    Body mass index is 32.44 kg/m.  Physical Exam Constitutional:      General: She is awake.     Appearance: She is well-developed and well-groomed. She is ill-appearing and diaphoretic.  Cardiovascular:     Rate and Rhythm: Normal rate and  regular rhythm.     Heart sounds: Normal heart sounds.  Pulmonary:     Effort: Tachypnea present.     Breath sounds: Decreased air movement present. Examination of the right-middle field reveals decreased breath sounds. Examination of the right-lower field reveals decreased breath sounds. Examination of the left-lower field reveals decreased breath sounds. Decreased breath sounds present. No wheezing, rhonchi or rales.  Musculoskeletal:     Cervical back: Normal range of motion.  Neurological:     Mental Status: She is alert.     Motor: Weakness and abnormal muscle tone present.  Psychiatric:        Attention and Perception: Attention normal.        Mood and Affect: Affect is flat.        Speech: Speech is slurred.        Behavior: Behavior normal. Behavior is cooperative.         PHQ2/9:    02/27/2023   12:48 PM 02/11/2023    2:39 PM 04/30/2021    8:05 AM 02/11/2021    8:53 AM 08/24/2020    3:22 PM  Depression screen PHQ 2/9  Decreased Interest 1 2 0 0 0  Down, Depressed, Hopeless 1 2 0 0 0  PHQ - 2 Score 2 4 0 0 0  Altered sleeping 1 3     Tired, decreased energy 1 3     Change in appetite 1 0     Feeling bad or failure about yourself  0 2     Trouble concentrating 0 0     Moving slowly or fidgety/restless 0 0     Suicidal thoughts 0 0     PHQ-9 Score 5 12     Difficult doing work/chores Somewhat difficult Somewhat difficult         Fall Risk:    02/27/2023   12:48 PM 02/11/2023    1:49 PM 04/30/2021    8:04 AM 02/11/2021    8:53 AM 08/24/2020    3:22 PM  Fall Risk   Falls in the past year? 1 Exclusion -  non ambulatory 0 0 0  Number falls in past yr: 1   0 0  Injury with Fall? 1   0 0  Risk for fall due to : Impaired balance/gait;History of fall(s)      Follow up Falls prevention discussed;Education provided;Falls evaluation completed Falls prevention discussed;Education provided;Falls evaluation completed Falls prevention discussed Falls evaluation completed Falls evaluation completed      Functional Status Survey: Is the patient deaf or have difficulty hearing?: Yes Does the patient have difficulty seeing, even when wearing glasses/contacts?: Yes Does the patient have difficulty concentrating, remembering, or making decisions?: Yes Does the patient have difficulty walking or climbing stairs?: Yes Does the patient have difficulty dressing or bathing?: Yes Does the patient have difficulty doing errands alone such as visiting a doctor's office or shopping?: Yes    Assessment & Plan  Problem List Items Addressed This Visit       Other   Sepsis (HCC)    Acute, new concern, potentially ongoing Patient was previously hospitalized from 11/1-11/8 for sepsis with encephalitis.  Unable to see discharge summary from this hospitalization.  Records requested but note does not appear finished.  Unable to complete hospital follow-up visit today. Per chart review patient has completed IV vancomycin course that was provided during dialysis treatments on 02/26/2023. Today I am concerned that she still has lingering infection given her lung sounds on  auscultation and overall presentation today in office Will send to ED for further evaluation and workup      Other Visit Diagnoses     SOB (shortness of breath)    -  Primary   Decreased breath sounds of both lungs          Acute, likely ongoing Patient was recently hospitalized for sepsis (unsure of source of infection as hospitalization charts are not available as of today-reviewing her intake charts from 02/13/2023 it appears that she was  diagnosed with pneumonia, cystitis.  She also has permacath and G-tube which could also be sources for infection) She presents today with diaphoresis, tachypnea, shortness of breath along with notable decreased breath sounds bilaterally during appointment today She should be finishing a course of IV vancomycin from her most recent hospital stay which is concerning given her current symptoms as I am not sure that her infection has been adequately treated Discussion was had with patient and her daughter about sending her back to the emergency room for evaluation and potential admission given her overall health status and recent hospitalization for sepsis. Initially patient did not wish to go to the hospital.  I asked her PCP Dr. Carlynn Purl to come in and evaluate her as well and discuss potential treatment options. Dr. Carlynn Purl is in agreement the patient should go to the hospital for evaluation.  After presenting patient with these options patient was amenable to being taken via EMS to ED. EMS was called and patient was transported to [facility] Recommend follow-up after discharge for further evaluation and ongoing management  No follow-ups on file.   I, Elene Downum E Karisha Marlin, PA-C, have reviewed all documentation for this visit. The documentation on 02/27/23 for the exam, diagnosis, procedures, and orders are all accurate and complete.   Jacquelin Hawking, MHS, PA-C [facility]

## 2023-02-27 NOTE — Progress Notes (Signed)
CODE SEPSIS - PHARMACY COMMUNICATION  **Broad Spectrum Antibiotics should be administered within 1 hour of Sepsis diagnosis**  Time Code Sepsis Called/Page Received: 1753  Antibiotics Ordered: cefepime, vanc  Time of 1st antibiotic administration: 1852  Additional action taken by pharmacy:    If necessary, Name of Provider/Nurse Contacted:      Angelique Blonder ,PharmD Clinical Pharmacist  02/27/2023  7:03 PM

## 2023-02-27 NOTE — Assessment & Plan Note (Signed)
Acute, new concern, potentially ongoing Patient was previously hospitalized from 11/1-11/8 for sepsis with encephalitis.  Unable to see discharge summary from this hospitalization.  Records requested but note does not appear finished.  Unable to complete hospital follow-up visit today. Per chart review patient has completed IV vancomycin course that was provided during dialysis treatments on 02/26/2023. Today I am concerned that she still has lingering infection given her lung sounds on auscultation and overall presentation today in office Will send to ED for further evaluation and workup

## 2023-02-27 NOTE — ED Triage Notes (Addendum)
See also first nurse note. Dialysis port on right side of chest and g-tube noted. Productive cough noted, respirations even, unlabored. Coarse crackles noted bilaterally. Pt is AOX4, pt is drooling and speech sounds slurred- pt slates this is normal for her. Mild non-pitting edema noted in upper and lower extremities bilaterally.

## 2023-02-27 NOTE — ED Provider Triage Note (Signed)
Emergency Medicine Provider Triage Evaluation Note  Danaye Youngren , a 72 y.o. female  was evaluated in triage.  Pt complains of being sent by doctor for sepsis work up. Patient unable to provide a history, review of previous providers note does not provide much more, nothing in A&P. Sounds like patient was admitted to the hospital, did not have a discharge summary. Getting IV vanc at dialysis, last dose 11/14. Previous provider is concerned about lingering infection.  Review of Systems  Positive: Cough Negative: fever  Physical Exam  BP 108/82 (BP Location: Left Arm)   Pulse 64   Temp 98.6 F (37 C) (Axillary)   Resp (!) 23   SpO2 99%  Gen:   Awake, no distress   Resp:  Normal effort  MSK:   Moves extremities without difficulty  Other:    Medical Decision Making  Medically screening exam initiated at 2:41 PM.  Appropriate orders placed.  Randy Chrisp Burkard was informed that the remainder of the evaluation will be completed by another provider, this initial triage assessment does not replace that evaluation, and the importance of remaining in the ED until their evaluation is complete.     Cameron Ali, PA-C 02/27/23 1451

## 2023-02-27 NOTE — Assessment & Plan Note (Signed)
-   This complicates overall care and prognosis.  

## 2023-02-27 NOTE — ED Triage Notes (Signed)
Recent sepsis. Discharge from hospital on 11/8.  Sent in from [place] from [facility] for concerns of sepsis.  Arrives from [facility] via [facility]  VS wnl.

## 2023-02-27 NOTE — Assessment & Plan Note (Addendum)
With increased productive cough Etiology workup in progress Given patient has uptrending leukocytosis of 15.7, with increased tachypnea, cough that is productive, persistent pneumonia cannot be excluded at this time Sepsis cannot be excluded at this time Blood cultures x 2 have been ordered and pending collection Per outpatient documentation, patient has been getting vancomycin with dialysis for prior infection treatment, suspected HD catheter site infection Broad-spectrum antibiotic with cefepime, vancomycin initiated on admission Admit to PCU, inpatient  Per discharging provider on last hospitalization, her source of sepsis was unclear at that time.

## 2023-02-27 NOTE — Assessment & Plan Note (Signed)
Home midodrine 20 mg 3 times daily and home fludrocortisone 0.2 milligram per tube daily resumed on admission

## 2023-02-27 NOTE — Hospital Course (Addendum)
[name] is a 18 female with ESRD on HD (TuThSa), G tube feed, hyperlipidemia, history of left AKA in 2023, depression, anxiety, who presents emergency department for chief concerns of concerns for sepsis.  Vitals in the ED showed temperature of 98.6, respiration rate 23, heart rate 64, blood pressure 108/82, SpO2 99% on room air.  Serum sodium is 133, potassium 3.3, chloride 100, bicarb 24, BUN of 27, serum creatinine of 2.30, EGFR of 22, nonfasting blood glucose 89, WBC 15.7, hemoglobin 12.4, platelets of 388.  Alk phos was elevated at 157.  AST was 59, ALT is 54.  BNP was 88.2.  Lactic acid is 1.2.  Procalcitonin was 0.22.  Blood cultures x 2 were ordered in the ED, pending collection at the time of this dictation.  ED treatment: None

## 2023-02-27 NOTE — Progress Notes (Signed)
PHARMACY -  BRIEF ANTIBIOTIC NOTE   Pharmacy has received consult(s) for vancomycin and cefepime from an ED provider.  The patient's profile has been reviewed for ht/wt/allergies/indication/available labs.    One time order(s) placed for cefepime 2 gm and Vancomycin 1000 mg  Further antibiotics/pharmacy consults should be ordered by admitting physician if indicated.                       Thank you, Nezar Buckles A 02/27/2023  6:04 PM

## 2023-02-27 NOTE — ED Notes (Signed)
Placed on purwick/ pericare performed/ barrier cream added to bottom/ brief changed

## 2023-02-27 NOTE — Assessment & Plan Note (Signed)
Per daughter, patient used to wear CPAP machine at night however after patient was intubated and on tracheostomy, after she was discharged home, she no longer tolerated the CPAP machine and has not been able to wear CPAP at night CPAP was considered however given daughters explanation, CPAP machine was discontinued Discussed with daughter at bedside that patient would need outpatient sleep study for evaluation of nasal CPAP machine/alternative to CPAP mask Daughter endorses understanding and compliance

## 2023-02-27 NOTE — Assessment & Plan Note (Signed)
Nephrology has been consulted via staff message time for 02/28/2023 at 07:00 hours

## 2023-02-28 ENCOUNTER — Inpatient Hospital Stay: Payer: Medicare Other

## 2023-02-28 DIAGNOSIS — N186 End stage renal disease: Secondary | ICD-10-CM | POA: Diagnosis not present

## 2023-02-28 DIAGNOSIS — R0609 Other forms of dyspnea: Secondary | ICD-10-CM | POA: Diagnosis not present

## 2023-02-28 DIAGNOSIS — R1310 Dysphagia, unspecified: Secondary | ICD-10-CM | POA: Diagnosis not present

## 2023-02-28 DIAGNOSIS — R0689 Other abnormalities of breathing: Secondary | ICD-10-CM | POA: Diagnosis present

## 2023-02-28 DIAGNOSIS — Z515 Encounter for palliative care: Secondary | ICD-10-CM

## 2023-02-28 DIAGNOSIS — R0600 Dyspnea, unspecified: Secondary | ICD-10-CM | POA: Diagnosis present

## 2023-02-28 LAB — CBC
HCT: 45.9 % (ref 36.0–46.0)
Hemoglobin: 14.1 g/dL (ref 12.0–15.0)
MCH: 25.7 pg — ABNORMAL LOW (ref 26.0–34.0)
MCHC: 30.7 g/dL (ref 30.0–36.0)
MCV: 83.6 fL (ref 80.0–100.0)
Platelets: 367 10*3/uL (ref 150–400)
RBC: 5.49 MIL/uL — ABNORMAL HIGH (ref 3.87–5.11)
RDW: 16.1 % — ABNORMAL HIGH (ref 11.5–15.5)
WBC: 32.3 10*3/uL — ABNORMAL HIGH (ref 4.0–10.5)
nRBC: 0 % (ref 0.0–0.2)

## 2023-02-28 LAB — PROCALCITONIN: Procalcitonin: 13.1 ng/mL

## 2023-02-28 LAB — BASIC METABOLIC PANEL
Anion gap: 13 (ref 5–15)
BUN: 33 mg/dL — ABNORMAL HIGH (ref 8–23)
CO2: 21 mmol/L — ABNORMAL LOW (ref 22–32)
Calcium: 9.4 mg/dL (ref 8.9–10.3)
Chloride: 100 mmol/L (ref 98–111)
Creatinine, Ser: 3.07 mg/dL — ABNORMAL HIGH (ref 0.44–1.00)
GFR, Estimated: 15 mL/min — ABNORMAL LOW (ref >60)
Glucose, Bld: 137 mg/dL — ABNORMAL HIGH (ref 70–99)
Potassium: 4 mmol/L (ref 3.5–5.1)
Sodium: 134 mmol/L — ABNORMAL LOW (ref 135–145)

## 2023-02-28 LAB — CORTISOL-AM, BLOOD: Cortisol - AM: 51 ug/dL — ABNORMAL HIGH (ref 6.7–22.6)

## 2023-02-28 MED ORDER — ALTEPLASE 2 MG IJ SOLR: 2.0000 mg | Freq: Once | INTRAMUSCULAR | Status: DC | PRN

## 2023-02-28 MED ORDER — BISACODYL 10 MG RE SUPP
10.0000 mg | Freq: Every day | RECTAL | Status: DC | PRN
  Administered 2023-02-28: 10 mg via RECTAL
  Filled 2023-02-28 (×4): qty 1

## 2023-02-28 MED ORDER — PROSOURCE TF20 ENFIT COMPATIBL EN LIQD
60.0000 mL | Freq: Every day | ENTERAL | Status: DC
  Administered 2023-03-01 – 2023-03-08 (×8): 60 mL
  Filled 2023-02-28 (×9): qty 60

## 2023-02-28 MED ORDER — VANCOMYCIN HCL IN DEXTROSE 1-5 GM/200ML-% IV SOLN
1000.0000 mg | INTRAVENOUS | Status: AC
  Administered 2023-02-28: 1000 mg via INTRAVENOUS
  Filled 2023-02-28: qty 200

## 2023-02-28 MED ORDER — METOCLOPRAMIDE HCL 5 MG/ML IJ SOLN
10.0000 mg | Freq: Three times a day (TID) | INTRAMUSCULAR | Status: DC
  Administered 2023-02-28 – 2023-03-08 (×22): 10 mg via INTRAVENOUS
  Filled 2023-02-28 (×22): qty 2

## 2023-02-28 MED ORDER — NEPRO/CARBSTEADY PO LIQD
1000.0000 mL | ORAL | Status: DC
  Administered 2023-03-01 – 2023-03-08 (×8): 1000 mL

## 2023-02-28 MED ORDER — LIDOCAINE-PRILOCAINE 2.5-2.5 % EX CREA
1.0000 | TOPICAL_CREAM | CUTANEOUS | Status: DC | PRN
Start: 2023-02-28 — End: 2023-03-02

## 2023-02-28 MED ORDER — PANCRELIPASE (LIP-PROT-AMYL) 10440-39150 UNITS PO TABS
20880.0000 [IU] | ORAL_TABLET | Freq: Once | ORAL | Status: DC
  Filled 2023-02-28: qty 2

## 2023-02-28 MED ORDER — LORAZEPAM 2 MG/ML IJ SOLN
0.5000 mg | Freq: Four times a day (QID) | INTRAMUSCULAR | Status: DC | PRN
  Administered 2023-02-28: 0.5 mg via INTRAVENOUS
  Filled 2023-02-28: qty 1

## 2023-02-28 MED ORDER — HEPARIN SODIUM (PORCINE) 1000 UNIT/ML DIALYSIS
20.0000 [IU]/kg | INTRAMUSCULAR | Status: DC | PRN
Start: 2023-02-28 — End: 2023-03-02

## 2023-02-28 MED ORDER — PENTAFLUOROPROP-TETRAFLUOROETH EX AERO: 1.0000 | INHALATION_SPRAY | CUTANEOUS | Status: DC | PRN

## 2023-02-28 MED ORDER — SODIUM BICARBONATE 650 MG PO TABS: 650.0000 mg | ORAL_TABLET | Freq: Once | ORAL | Status: DC

## 2023-02-28 MED ORDER — LIDOCAINE HCL (PF) 1 % IJ SOLN: 5.0000 mL | INTRAMUSCULAR | Status: DC | PRN

## 2023-02-28 MED ORDER — HEPARIN SODIUM (PORCINE) 1000 UNIT/ML DIALYSIS
1000.0000 [IU] | INTRAMUSCULAR | Status: DC | PRN
Start: 2023-02-28 — End: 2023-03-02

## 2023-02-28 MED ORDER — DOXYCYCLINE HYCLATE 100 MG PO TABS
100.0000 mg | ORAL_TABLET | Freq: Two times a day (BID) | ORAL | Status: DC
  Administered 2023-02-28 – 2023-03-01 (×2): 100 mg
  Filled 2023-02-28 (×2): qty 1

## 2023-02-28 MED ORDER — MIDODRINE HCL 5 MG PO TABS
20.0000 mg | ORAL_TABLET | Freq: Once | ORAL | Status: AC
  Administered 2023-02-28: 20 mg via ORAL
  Filled 2023-02-28: qty 4

## 2023-02-28 MED ORDER — VANCOMYCIN HCL IN DEXTROSE 1-5 GM/200ML-% IV SOLN: 1000.0000 mg | INTRAVENOUS | Status: DC

## 2023-02-28 MED ORDER — HEPARIN SODIUM (PORCINE) 1000 UNIT/ML IJ SOLN
INTRAMUSCULAR | Status: AC
  Filled 2023-02-28: qty 10

## 2023-02-28 MED ORDER — ANTICOAGULANT SODIUM CITRATE 4% (200MG/5ML) IV SOLN
5.0000 mL | Status: DC | PRN
Start: 2023-02-28 — End: 2023-03-02

## 2023-02-28 MED ORDER — SODIUM CHLORIDE 0.9 % IV SOLN
3.0000 g | INTRAVENOUS | Status: DC
  Administered 2023-02-28: 3 g via INTRAVENOUS
  Filled 2023-02-28 (×2): qty 8

## 2023-02-28 MED ORDER — CHLORHEXIDINE GLUCONATE CLOTH 2 % EX PADS: 6.0000 | MEDICATED_PAD | Freq: Every day | CUTANEOUS | Status: DC

## 2023-02-28 NOTE — Progress Notes (Signed)
Initial Nutrition Assessment  DOCUMENTATION CODES:   Not applicable  INTERVENTION:   Once able to resume TF via PEG, recommend: Nepro at 31ml/hr ( per day) 60ml ProSource TF 20 once daily Goal TF provides: 1992 kcal, 107g protein, and free water daily   Once tolerating re-initiation of TF, consider adding Juven BID per tube to support wound healing  Renal MVI with minerals daily  NUTRITION DIAGNOSIS:   Increased nutrient needs related to chronic illness as evidenced by estimated needs.  GOAL:   Patient will meet greater than or equal to 90% of their needs  MONITOR:   Labs, Weight trends, TF tolerance, Skin, I & O's  REASON FOR ASSESSMENT:   Consult Enteral/tube feeding initiation and management  ASSESSMENT:   Pt presents with dyspnea on exertion, concern for sepsis by outpatient provider. PMH significant for ESRD on HD, G tube dependent, HLD, h/o L AKA (2023), depression, anxiety. Multiple recent admissions d/t MRSA and staph epi bacteremia from R chest permcath and UTI as well as toxic metabolic encephalopathy septic shock  Pt well known to RD team as she has been followed closely during prior admission.   Pt currently remains in the ED. Unable to speak with pt via phone call.  Review of prior RD notes reflect pt on bolus regimen at home but continuous during admission. Suspected h/o gastroparesis at baseline with intermittent vomiting.   Pt noted to have episode of emesis after medication administration last night.  TF started over night however held for 2 hours following emesis.  This morning noted G tube leaking and pops out with medication administration.   Reached out to RN regarding current TF orders. RN reports pt was receiving Nepro at 69ml/hr however TF currently on hold given G tube stopper pops out when connecting to feeds. RN reports IR cannot assess tube until Monday. Will updated TF order when able to resume feeding.   Uncertain of EDW.   Updated weight from outside provider PTA was 85.7 kg. No significant weight changes since last admission noted.   Medications: Vitamin C 500mg  BID, Vitamin D3 2000 united daily, questrana, pepcid, reglan, rena-vit, IV abx, PRN zofran  Labs: sodium 134, BUN 33, Cr 3.07, GFR 15  NUTRITION - FOCUSED PHYSICAL EXAM: RD working remotely. Deferred to follow up.   Diet Order:   Diet Order             Diet NPO time specified  Diet effective now                   EDUCATION NEEDS:   No education needs have been identified at this time  Skin:  Skin Integrity Issues:: Stage II Stage II: buttock  Last BM:  11/15  Height:   Ht Readings from Last 1 Encounters:  02/27/23 5\' 4"  (1.626 m)    Weight:   Wt Readings from Last 1 Encounters:  02/27/23 85.7 kg   BMI:  There is no height or weight on file to calculate BMI.  Estimated Nutritional Needs:   Kcal:  1800-2000  Protein:  90-105g  Fluid:  1L + UOP  Drusilla Kanner, RDN, LDN Clinical Nutrition

## 2023-02-28 NOTE — ED Notes (Signed)
Repositioned pt, pillows applied then removed per pt request. Pt asked nurse to scratch head. Pt states she is "itchy".

## 2023-02-28 NOTE — ED Notes (Signed)
Tube feed resumed per Jon Billings, NP's request

## 2023-02-28 NOTE — Progress Notes (Signed)
Pharmacy Antibiotic Note  Alexandra Foster is a 72 y.o. 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  Pharmacy has been consulted for Unasyn and Vancomycin dosing.  Plan: Unasyn 3 grams IV every 24 hours follow renal function for needed dose adjustments  2. Vancomycin 1000 mg IV given 11/15 @ 1949, Will give Vancomycin 1000 mg IV today to complete loading dose, then 1000 mg IV after each HD session.  3. Draw level around 4th to 5th dose if needed 4. Follow cultures and adjust therapy as needed  Temp (24hrs), Avg:98.4 F (36.9 C), Min:97.9 F (36.6 C), Max:99.6 F (37.6 C)  Recent Labs  Lab 02/27/23 1449 02/28/23 0621  WBC 15.7* 32.3*  CREATININE 2.30* 3.07*  LATICACIDVEN 1.8  --     Estimated Creatinine Clearance: 17 mL/min (A) (by C-G formula based on SCr of 3.07 mg/dL (H)).    No Known Allergies  Antimicrobials this admission: 11/16 Unasyn >>  11/16 Vancomycin  Microbiology results: 11/16 BCx: NGTD  Thank you for allowing pharmacy to be a part of this patient's care.  Meriel Pica Rodriguez-Guzman PharmD, BCPS 02/28/2023 1:33 PM

## 2023-02-28 NOTE — ED Notes (Signed)
This nurse attempted to flush meds down gtube. G tube immediately  leaks and pops out any lid or other tube that is connected. Family at bedside advised [place] to wait to push the rest of the medications due to possibility her abdomen is too full due to the feedings. MD made aware

## 2023-02-28 NOTE — ED Notes (Signed)
Pt husband at bedside. Pt Husband said to update pt daughter when pt is moved upstairs.

## 2023-02-28 NOTE — ED Notes (Signed)
Pt is sleeping. IV abx infusing through PIV and continuous tube feed infusing through G tube. Pt has mitts on hands. Respirations are unlabored.

## 2023-02-28 NOTE — ED Notes (Signed)
Pt is increasingly anxious. This nurse messaged MD for Iv anxiety med due to the tube pushing all medications out.

## 2023-02-28 NOTE — Consult Note (Signed)
Consultation Note Date: 02/28/2023   Patient Name: Alexandra Foster  DOB: [birth year]  MRN: [id number]  Age / Sex: 72 y.o., female  PCP: [name], MD Referring Physician: Tresa Moore, MD  Reason for Consultation: Establishing goals of care   HPI/Brief Hospital Course: 73 y.o. female  with past medical history of ESRD on HD TTS, g-tube with tube feedings due to severe dysphagia due to history of prolonged tracheostomy, left AKA, asthma, T2DM with neuropathy, OSA previously on CPAP, adrenal insufficiency and paroxsymal atrial fibrillation admitted from home on 02/27/2023 with worsening productive cough and dyspnea on exertion.   Had follow-up appointment with PCP on 11/15 and due to acute diaphoresis, tachypnea and [facility] PCP recommended EMS transport to ED-concern for recurrent infection/sepsis  Found to have leukocytosis with associated symptoms as noted above, etiology work up pending-started on broad spectrum antibiotics  Noted recent hospitalization 11/1-11/8 for septic shock, etiology unclear-PEG tube evaluated by general surgery, perm cath removed and replaced although blood cultures remained negative, urine culture grew multiple species-likely colonization-discharged on IV vancomycin to be administered during HD sessions  Ms. Hedgecock is familiar to PMT services as she has been followed closely during previous hospitalizations  Palliative medicine was consulted for assisting with goals of care conversations.  Subjective:  Extensive chart review has been completed prior to meeting patient including labs, vital signs, imaging, progress notes, orders, and available advanced directive documents from current and previous encounters.  Visited with Alexandra Foster at her bedside. She is awake, alert, able to engage in conversations. No family at bedside during time of visit.  Introduced myself as a Publishing rights manager as a member of the palliative  care team. Explained palliative medicine is specialized medical care for people living with serious illness. It focuses on providing relief from the symptoms and stress of a serious illness. The goal is to improve quality of life for both the patient and the family.   Alexandra Foster is able to recall our previous visits and conversations. Alexandra Foster shares she was brought to hospital again for concern for recurrent infection/sepsis.  Alexandra Foster shares her daughter is currently on vacation out of town and does not want me to call and update or discuss things with her at this time. Alexandra Foster does give me permission to call and speak to her husband, Alexandra Foster. [name] shares she wishes to continue with current plan of care.  Called and spoke with Alexandra Foster-he is also able to recall our previous encounters. He confirms daughter is out of town and he also would prefer that she not be called at this time. Alexandra Foster shares he will be calling and speaking with daughter later this evening and would encourage daughter to reach out to PMT with any needs or concerns.  I discussed importance of continued conversations with family/support persons and all members of their medical team regarding overall plan of care and treatment options ensuring decisions are in alignment with patients goals of care.  All questions/concerns addressed. Emotional support provided to patient/family/support persons. PMT will continue to follow and support patient as needed.  Objective: Primary Diagnoses: Present on Admission:  Dyspnea on exertion  Osteopenia  Malnutrition (HCC)  Anxiety and depression  OSA (obstructive sleep apnea)  Adrenal insufficiency (HCC)  Dyspnea and respiratory abnormalities   Physical Exam Constitutional:      General: She is not in acute distress.    Appearance: She is ill-appearing.     Comments: Dysarthria from previous prolonged tracheostomy  Pulmonary:     Effort: Pulmonary effort is normal. No  respiratory distress.  Skin:    General: Skin is warm and dry.  Neurological:     Mental Status: She is alert and oriented to person, place, and time.     Vital Signs: BP (!) 148/118   Pulse 88   Temp (!) 100.7 F (38.2 C) (Axillary) Comment: Pt refused oral temp  Resp (!) 21   SpO2 98%  Pain Scale: 0-10   Pain Score: 0-No pain  IO: Intake/output summary:  Intake/Output Summary (Last 24 hours) at 02/28/2023 1638 Last data filed at 02/28/2023 1520 Gross per 24 hour  Intake 550 ml  Output --  Net 550 ml    LBM: Last BM Date : 02/27/23 Baseline Weight:   Most recent weight:        Assessment and Plan  SUMMARY OF RECOMMENDATIONS   Full Code-Full Scope Time for outcomes PMT to continue to follow for ongoing needs and support  Palliative Prophylaxis:   Bowel Regimen, Delirium Protocol and Frequent Pain Assessment   Discussed With: Primary team   Thank you for this consult and allowing Palliative Medicine to participate in the care of Alexandra Foster. Palliative medicine will continue to follow and assist as needed.   Time Total: 40 minutes  Time spent includes: Detailed review of medical records (labs, imaging, vital signs), medically appropriate exam (mental status, respiratory, cardiac, skin), discussed with treatment team, counseling and educating patient, family and staff, documenting clinical information, medication management and coordination of care.   Signed by: Leeanne Deed, DNP, AGNP-C Palliative Medicine    Please contact Palliative Medicine Team phone at [phone] for questions and concerns.  For individual provider: See Loretha Stapler

## 2023-02-28 NOTE — ED Notes (Signed)
Leslee Home made aware of B/P- this nurse was told to continue to monitor

## 2023-02-28 NOTE — ED Notes (Signed)
Called daughter Elease Hashimoto [phone]) for update

## 2023-02-28 NOTE — ED Notes (Signed)
Admitting NP made aware of B/P for the 2nd time/ no new orders

## 2023-02-28 NOTE — ED Notes (Signed)
Family at bedside. 

## 2023-02-28 NOTE — Progress Notes (Signed)
Pharmacy Antibiotic Note  Alexandra Foster is a 72 y.o. 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  Pharmacy has been consulted for Unasyn dosing.  Plan: start Unasyn 3 grams IV every 24 hours ---follow renal function for needed dose adjustments    Temp (24hrs), Avg:98 F (36.7 C), Min:97.3 F (36.3 C), Max:98.6 F (37 C)  Recent Labs  Lab 02/27/23 1449 02/28/23 0621  WBC 15.7* 32.3*  CREATININE 2.30* 3.07*  LATICACIDVEN 1.8  --     Estimated Creatinine Clearance: 17 mL/min (A) (by C-G formula based on SCr of 3.07 mg/dL (H)).    No Known Allergies  Antimicrobials this admission: 11/16 Unasyn >>   Microbiology results: 11/16 BCx: NGTD  Thank you for allowing pharmacy to be a part of this patient's care.  Lowella Bandy 02/28/2023 8:21 AM

## 2023-02-28 NOTE — Progress Notes (Signed)
IV Team unable to obtain access. Pt with multiple skin tears. Bilateral arms assessed with and without [place] - veins too small, too deep, or non-compressible. Recommend central line placement. Primary RN notified.

## 2023-02-28 NOTE — ED Notes (Signed)
Called pharmacy to retime next feeding supplement. Feeding supplement is going at 66mL/hr and has in bottle (was started late last night). Also asked about compatibility of feeding supplement with AM meds to be given per tube. Midodrine and metoclopramide are compatible with tube feeding.

## 2023-02-28 NOTE — ED Notes (Signed)
This Rn provided pericare to pt, full linen change, new gown and new chuck pad placed. Warm blankets given and pt repositioned. Call light within reach. NAD

## 2023-02-28 NOTE — Progress Notes (Signed)
[facility]  PROGRESS NOTE   Subjective:   Patient is known to our practice from previous admissions.  She was recently discharged on November 8.  She is brought back to the hospital by family due to concerns of coughing, difficulty breathing, concern of infection around the G-tube.  Her husband is at bedside. Patient denies any acute pain at present. Nursing staff reports that some of the tube feeds regurgitates out of the G-tube.  It can be flushed with water but there is some resistance to tube feeds. Evaluation in the ER shows elevated white count of 15.7 that has increased to 32.3 today.  Hemoglobin is in the normal range.  Electrolytes panel show normal potassium.  BUN level of 33.  Blood glucose of 137.  Blood cultures have been performed and are pending.   Objective:  Vital signs: Blood pressure (!) 93/58, pulse 88, temperature 99.6 F (37.6 C), temperature source Oral, resp. rate (!) 25, SpO2 96%.  Intake/Output Summary (Last 24 hours) at 02/28/2023 1252 Last data filed at 02/28/2023 1000 Gross per 24 hour  Intake 350 ml  Output --  Net 350 ml    There were no vitals filed for this visit.    Physical Exam: General:  No acute distress  Head:  Normocephalic, atraumatic. Moist oral mucosal membranes  Eyes:  Anicteric  Lungs:   normal effort, coarse breath sounds  Heart:  S1S2 no rubs  Abdomen:   Soft, nontender, bowel sounds present, G-tube in place  Extremities: Trace to 1+ peripheral edema.  Neurologic:  Awake, alert, following commands  Skin:  No lesions  Access: Rt chest permcath/Dr. Dew/02/16/2023    Basic Metabolic Panel: Recent Labs  Lab 02/27/23 1449 02/28/23 0621  NA 133* 134*  K 3.3* 4.0  CL 100 100  CO2 24 21*  GLUCOSE 89 137*  BUN 27* 33*  CREATININE 2.30* 3.07*  CALCIUM 9.2 9.4   GFR: Estimated Creatinine Clearance: 17 mL/min (A) (by C-G formula based on SCr of 3.07 mg/dL (H)).  Liver Function Tests: Recent Labs  Lab  02/27/23 1449  AST 59*  ALT 74*  ALKPHOS 157*  BILITOT 0.5  PROT 7.3  ALBUMIN 3.0*   No results for input(s): "LIPASE", "AMYLASE" in the last 168 hours. No results for input(s): "AMMONIA" in the last 168 hours.   CBC: Recent Labs  Lab 02/27/23 1449 02/28/23 0621  WBC 15.7* 32.3*  NEUTROABS 8.8*  --   HGB 12.4 14.1  HCT 39.8 45.9  MCV 82.4 83.6  PLT 388 367     HbA1C: Hgb A1c MFr Bld  Date/Time Value Ref Range Status  02/11/2023 02:55 PM 4.7 <5.7 % of total Hgb Final    Comment:    For the purpose of screening for the presence of diabetes: . <5.7%       Consistent with the absence of diabetes 5.7-6.4%    Consistent with increased risk for diabetes             (prediabetes) > or =6.5%  Consistent with diabetes . This assay result is consistent with a decreased risk of diabetes. . Currently, no consensus exists regarding use of hemoglobin A1c for diagnosis of diabetes in children. . According to American Diabetes Association (ADA) guidelines, hemoglobin A1c <7.0% represents optimal control in non-pregnant diabetic patients. Different metrics may apply to specific patient populations.  Standards of Medical Care in Diabetes(ADA). [name]   12/07/2022 02:13 AM 5.2 4.8 - 5.6 % Final  Comment:    (NOTE) Pre diabetes:          5.7%-6.4%  Diabetes:              >6.4%  Glycemic control for   <7.0% adults with diabetes     Urinalysis: No results for input(s): "COLORURINE", "LABSPEC", "PHURINE", "GLUCOSEU", "HGBUR", "BILIRUBINUR", "KETONESUR", "PROTEINUR", "UROBILINOGEN", "NITRITE", "LEUKOCYTESUR" in the last 72 hours.  Invalid input(s): "APPERANCEUR"     Imaging: CT CHEST ABDOMEN PELVIS WO CONTRAST  Result Date: 02/27/2023 CLINICAL DATA:  Recent sepsis, discharge from hospital 02/20/2023 EXAM: CT CHEST, ABDOMEN AND PELVIS WITHOUT CONTRAST TECHNIQUE: Multidetector CT imaging of the chest, abdomen and pelvis was performed following the standard protocol  without IV contrast. RADIATION DOSE REDUCTION: This exam was performed according to the departmental dose-optimization program which includes automated exposure control, adjustment of the mA and/or kV according to patient size and/or use of iterative reconstruction technique. COMPARISON:  02/13/2023, 12/10/2022 FINDINGS: CT CHEST FINDINGS Cardiovascular: Unenhanced imaging of the heart is stable without pericardial effusion. Calcification of the mitral annulus. Right internal jugular dialysis catheter tip within the superior vena cava. Normal caliber of the thoracic aorta. Stable atherosclerosis of the aorta and coronary vasculature. Mediastinum/Nodes: No enlarged mediastinal, hilar, or axillary lymph nodes. Thyroid gland, trachea, and esophagus demonstrate no significant findings. Lungs/Pleura: Trace bilateral pleural effusions, left greater than right. Minimal dependent bilateral lower lobe atelectasis. No airspace disease or pneumothorax. Central airways are patent. There is a 6 x 7 mm right lower lobe pulmonary nodule reference image 77/4, unchanged since 12/10/2022. Musculoskeletal: No acute or destructive bony abnormalities. Reconstructed images demonstrate no additional findings. CT ABDOMEN PELVIS FINDINGS Hepatobiliary: Nodular contour of the liver consistent with cirrhosis. No focal parenchymal abnormality identified on this limited unenhanced exam. Stable calcified gallstones without cholecystitis. Pancreas: Unremarkable unenhanced appearance. Spleen: Unremarkable unenhanced appearance. Adrenals/Urinary Tract: Multiple nonobstructing right renal calculi are identified, unchanged in size or number with largest in the upper pole measuring 14 mm. No left-sided calculi. No obstructive uropathy within either kidney. Numerous calcified phleboliths are seen along the course of the right gonadal vein, and should not be confused with ureteral calculi. Stomach/Bowel: No bowel obstruction or ileus. Normal appendix  right lower quadrant. Diverticulosis of the sigmoid colon without diverticulitis. No bowel wall thickening or inflammatory change. Percutaneous gastrostomy tube tip within the gastric lumen, with no evidence of complication. Vascular/Lymphatic: Stable IVC filter. Stable aortic atherosclerosis. No pathologic adenopathy within the abdomen or pelvis. Reproductive: Status post hysterectomy. No adnexal masses. Other: No free fluid or free intraperitoneal gas. Small fat containing hiatal hernia. Musculoskeletal: No acute or destructive bony abnormalities. Reconstructed images demonstrate no additional findings. IMPRESSION: 1. Trace bilateral pleural effusions with minimal dependent lower lobe atelectasis. 2. Cirrhosis. 3. Stable nonobstructing right renal calculi. 4. Cholelithiasis without cholecystitis. 5. Sigmoid diverticulosis without diverticulitis. 6. 6 mm mean diameter right lower lobe pulmonary nodule, stable for 3 months. Non-contrast chest CT at 3-9 months is recommended to document 1 year stability. If the nodule is stable at time of repeat CT, then future CT at 15-21 months (from today's scan) is considered optional for low-risk patients, but is recommended for high-risk patients. This recommendation follows the consensus statement: Guidelines for Management of Incidental Pulmonary Nodules Detected on CT Images: From the Fleischner Society 2017; Radiology 2017; 284:228-243. 7.  Aortic Atherosclerosis (ICD10-I70.0). Electronically Signed   By: Sharlet Salina M.D.   On: 02/27/2023 20:48   DG Chest Port 1 View  Result Date: 02/27/2023 CLINICAL DATA:  Possible sepsis.  Coarse crackles noted bilaterally. EXAM: PORTABLE CHEST 1 VIEW COMPARISON:  One-view chest x-ray 02/13/2023 FINDINGS: Patient is rotated to the right. The left IJ catheter was removed. A new right IJ dialysis catheter is in place. The distal tip is at the cavoatrial junction. The heart size is normal. Lung volumes are low. Left basilar airspace  opacity is noted. No focal airspace disease is present in the right. No edema or effusion is present. The visualized soft tissues and bony thorax are unremarkable. IMPRESSION: 1. Low lung volumes with left basilar airspace disease concerning for pneumonia. 2. New right IJ dialysis catheter without complicating features. Electronically Signed   By: Marin Roberts M.D.   On: 02/27/2023 17:52     Medications:    ampicillin-sulbactam (UNASYN) IV Stopped (02/28/23 1000)   doxycycline (VIBRAMYCIN) IV Stopped (02/28/23 0909)    ascorbic acid  500 mg Per Tube BID   atorvastatin  20 mg Per Tube Daily   vitamin D3  2,000 Units Per Tube Daily   cholestyramine  4 g Per Tube TID   escitalopram  10 mg Per Tube Daily   famotidine  10 mg Per Tube Daily   feeding supplement (NEPRO CARB STEADY)  1,120 mL Per Tube Q24H   fludrocortisone  0.2 mg Per Tube Daily   heparin  5,000 Units Subcutaneous Q8H   hydrOXYzine  100 mg Per Tube BID   metoCLOPramide  5 mg Oral TID AC   midodrine  20 mg Per Tube TID WC   multivitamin  1 tablet Per Tube QHS   nystatin   Topical TID    Assessment/ Plan:     72 year old female with history of hypertension, coronary artery disease, congestive heart failure, COPD, cirrhosis, diabetes, status post AKA, status post G-tube placement.   Patient is admitted for: Dyspnea on exertion [R06.09]  Outpatient dialysis: [facility]/TTS/[facility] [address]/right IJ PermCath  #1: End-stage renal disease:  We will continue hemodialysis TTS schedule.  Consent obtained from daughter and her husband who is at bedside.  During previous admission she had sepsis from unclear source.  PermCath was removed and then replaced.  She was given empiric vancomycin and Zosyn.  Vancomycin was continued at discharge.  She also had transaminitis which was thought to be related to sepsis.    #2:  Chronic hypotension:   Patient has history of chronic hypotension from adrenal insufficiency. Requires midodrine 20  mg 3 times per day. During his previous admission she required stress dose steroid.   #3:  Hypokalemia. Today's potassium level is a range.    LOS: 1 Zyra Parrillo [facility] 11/16/202412:52 PM

## 2023-02-28 NOTE — ED Notes (Signed)
Manuela Schwartz, NP aware of  low B/P and that pt vomited after admin of all nighttime medications via feeding tube/ NP also aware that pt has audible rhonchi/ this nurse suggested that pt may have aspirated/ order for suctioning placed/

## 2023-02-28 NOTE — ED Notes (Signed)
Ot vomited after admin of all nighttime meds, including her midodrine for B/P/ NP made aware

## 2023-02-28 NOTE — ED Notes (Signed)
Called daughter Elease Hashimoto [phone]) to update

## 2023-02-28 NOTE — ED Notes (Signed)
Put tube feed on hold per Jon Billings, NP's request

## 2023-02-28 NOTE — Progress Notes (Signed)
Received patient from the ED, skin assessed patient arrived with stage 2 ulcer to buttocks, mepilex applied, noted to have skin tear inner thigh area mepilex applied. Overall the patient skin not in good condition. Skin extremely thin, sloughing and excoriated and red all over. Family member at bedside states patient maybe allergic to CHG belive to have had a concern from a recent admission. Wound consult to be entered for further follow up. G-tube site with redness at site, split-gauze dressing applied.    02/28/23 1845  Pressure Injury 02/14/23 Buttocks Left;Lower;Mid Stage 2 -  Partial thickness loss of dermis presenting as a shallow open injury with a red, pink wound bed without slough. pink, red  Date First Assessed/Time First Assessed: 02/14/23 1315   Location: Buttocks  Location Orientation: Left;Lower;Mid  Staging: Stage 2 -  Partial thickness loss of dermis presenting as a shallow open injury with a red, pink wound bed without slough.  Wou...  Dressing Type Foam - Lift dressing to assess site every shift (place foam dressing on admisson)  Dressing Changed  Site / Wound Assessment Pink;Red  Treatment  (stage 2 on admission, mepilex placed on sacrum)  Wound / Incision (Open or Dehisced) 12/17/22 Non-pressure wound;Skin tear Thigh Left;Posterior;Proximal scratching skin tears  Date First Assessed/Time First Assessed: 12/17/22 0730   Wound Type: Non-pressure wound;Skin tear  Location: Thigh  Location Orientation: Left;Posterior;Proximal  Wound Description (Comments): scratching skin tears  Dressing Type Foam - Lift dressing to assess site every shift  Dressing Changed New  Dressing Status Clean, Dry, Intact  Site / Wound Assessment Bleeding;Clean  Drainage Amount Minimal  Drainage Description Serous

## 2023-02-28 NOTE — ED Notes (Signed)
Unsuccessful in obtaining blood for morning draw/ lab called to come and obtain

## 2023-02-28 NOTE — Progress Notes (Signed)
PROGRESS NOTE    Alexandra Foster  ZOX:[id number] DOB: [birth year] DOA: 02/27/2023 PCP: [name], MD    Brief Narrative:  26 female with ESRD on HD (TuThSa), G tube feed, hyperlipidemia, history of left AKA in 2023, depression, anxiety, who presents emergency department for chief concerns of concerns for sepsis. At bedside, patient is able to tell me her name, age, current location, current calendar year. She has difficulty enunciating her words and her daughter states this is normal due to hsitry of prolong tracheostomy for at least 8 months.    [name], daugther who is priamry care giver at bedside: denies fever, nausea, vomiting, diarrhea, blood in her urine. Per daughter, patient denies dysuria.     Assessment & Plan:   Principal Problem:   Dyspnea on exertion Active Problems:   Type 2 diabetes mellitus with chronic kidney disease, with long-term current use of insulin (HCC)   ESRD on hemodialysis (HCC)   OSA (obstructive sleep apnea)   Osteopenia   Anxiety and depression   Dysphagia   Malnutrition (HCC)   History of necrotizing fasciitis   Adrenal insufficiency (HCC)   Gastrostomy tube in place (HCC)   Hx of AKA (above knee amputation), left (HCC)   Truncal obesity  Dyspnea With increased productive cough Etiology workup in progress Patient does have a leukocytosis which is subsequently uptrending associated with tachypnea and productive cough cannot totally exclude pneumonia at this time. Plan: Empiric broad-spectrum IV antibiotics Monitor vitals and fever curve Continue midodrine Low threshold for transfer to higher level of care    ESRD on hemodialysis [facility]) Nephrology following for inpatient HD needs   Truncal obesity This complicates overall care and prognosis.    Hx of AKA (above knee amputation), left (HCC) No acute issues   Gastrostomy tube in place [facility]) For history of severe dysphagia RD consulted for initiation of tube feeds.  May need  interventional radiology to perform tube check however this will not be available until Monday 11/18   Adrenal insufficiency (HCC) Home midodrine 20 mg 3 times daily and home fludrocortisone 0.2 milligram per tube daily resumed on admission   Dysphagia Aspiration precaution Incentive spirometry, flutter valve every 2 hours while awake    Anxiety and depression Resume home Lexapro 10 mg daily, hydroxyzine 100 mg twice daily, clonazepam 0.5 mg daily as needed   OSA (obstructive sleep apnea) Unable to tolerate home CPAP.   DVT prophylaxis: SQ heparin Code Status: Full Family Communication: None Disposition Plan: Status is: Inpatient Remains inpatient appropriate because: Multiple acute issues as above   Level of care: Progressive  Consultants:  Palliative care Nephrology  Procedures:  None  Antimicrobials: Vancomycin Unasyn  Subjective: Seen and examined.  No family at bedside this morning.  Patient unable to provide history.  Objective: Vitals:   02/28/23 0845 02/28/23 0900 02/28/23 1040 02/28/23 1300  BP: (!) 74/51 (!) 85/70 (!) 93/58 92/73  Pulse: 91 88  88  Resp: (!) 0 (!) 25  (!) 21  Temp:      TempSrc:      SpO2: 97% 96%  98%    Intake/Output Summary (Last 24 hours) at 02/28/2023 1321 Last data filed at 02/28/2023 1000 Gross per 24 hour  Intake 350 ml  Output --  Net 350 ml   There were no vitals filed for this visit.  Examination:  General exam: Appears frail and chronically ill Respiratory system: Crackles.  Normal work of breathing.  2 L Cardiovascular system: S1-S2, RRR, no  murmurs, no pedal edema Gastrointestinal system: NT/ND, normal bowel sounds,+ PEG Central nervous system: Sleepy.  Unable to assess orientation Extremities: 0 X5 bilaterally lower extremities Skin: No rashes, lesions or ulcers Psychiatry: Unable to assess    Data Reviewed: I have personally reviewed following labs and imaging studies  CBC: Recent Labs  Lab  02/27/23 1449 02/28/23 0621  WBC 15.7* 32.3*  NEUTROABS 8.8*  --   HGB 12.4 14.1  HCT 39.8 45.9  MCV 82.4 83.6  PLT 388 367   Basic Metabolic Panel: Recent Labs  Lab 02/27/23 1449 02/28/23 0621  NA 133* 134*  K 3.3* 4.0  CL 100 100  CO2 24 21*  GLUCOSE 89 137*  BUN 27* 33*  CREATININE 2.30* 3.07*  CALCIUM 9.2 9.4   GFR: Estimated Creatinine Clearance: 17 mL/min (A) (by C-G formula based on SCr of 3.07 mg/dL (H)). Liver Function Tests: Recent Labs  Lab 02/27/23 1449  AST 59*  ALT 74*  ALKPHOS 157*  BILITOT 0.5  PROT 7.3  ALBUMIN 3.0*   No results for input(s): "LIPASE", "AMYLASE" in the last 168 hours. No results for input(s): "AMMONIA" in the last 168 hours. Coagulation Profile: Recent Labs  Lab 02/27/23 1449  INR 1.0   Cardiac Enzymes: No results for input(s): "CKTOTAL", "CKMB", "CKMBINDEX", "TROPONINI" in the last 168 hours. BNP (last 3 results) No results for input(s): "PROBNP" in the last 8760 hours. HbA1C: No results for input(s): "HGBA1C" in the last 72 hours. CBG: No results for input(s): "GLUCAP" in the last 168 hours. Lipid Profile: No results for input(s): "CHOL", "HDL", "LDLCALC", "TRIG", "CHOLHDL", "LDLDIRECT" in the last 72 hours. Thyroid Function Tests: No results for input(s): "TSH", "T4TOTAL", "FREET4", "T3FREE", "THYROIDAB" in the last 72 hours. Anemia Panel: No results for input(s): "VITAMINB12", "FOLATE", "FERRITIN", "TIBC", "IRON", "RETICCTPCT" in the last 72 hours. Sepsis Labs: Recent Labs  Lab 02/27/23 1449 02/27/23 1453 02/28/23 0621  PROCALCITON  --  0.22 13.10  LATICACIDVEN 1.8  --   --     Recent Results (from the past 240 hour(s))  Blood Culture (routine x 2)     Status: None (Preliminary result)   Collection Time: 02/27/23  2:49 PM   Specimen: BLOOD LEFT ARM  Result Value Ref Range Status   Specimen Description BLOOD LEFT ARM  Final   Special Requests   Final    BOTTLES DRAWN AEROBIC AND ANAEROBIC Blood Culture  adequate volume   Culture   Final    NO GROWTH < 24 HOURS Performed at [facility], [address] [place], [place] [address]    Report Status PENDING  Incomplete  Blood Culture (routine x 2)     Status: None (Preliminary result)   Collection Time: 02/28/23  6:21 AM   Specimen: BLOOD  Result Value Ref Range Status   Specimen Description BLOOD BLOOD RIGHT HAND  Final   Special Requests   Final    BOTTLES DRAWN AEROBIC AND ANAEROBIC Blood Culture results may not be optimal due to an inadequate volume of blood received in culture bottles   Culture   Final    NO GROWTH <12 HOURS Performed at [facility], [address], [place], [place] [address]    Report Status PENDING  Incomplete         Radiology Studies: DG Abd 1 View  Result Date: 02/28/2023 CLINICAL DATA:  Abdominal pain with leaking gastrostomy tube EXAM: ABDOMEN - 1 VIEW COMPARISON:  Abdominal radiograph dated 12/31/2022 FINDINGS: Patient is  rotated to the right. Percutaneous gastrostomy tube projects over the right upper quadrant. Nonspecific bowel gas pattern. Large volume stool within the ascending colon. No free air or pneumatosis. No abnormal radio-opaque calculi or mass effect. No acute or substantial osseous abnormality. The sacrum and coccyx are partially obscured by overlying bowel contents. IVC filter in-situ. Partially imaged central venous catheter tip projects over the superior cavoatrial junction. IMPRESSION: 1. Nonspecific bowel gas pattern. Large volume stool within the ascending colon. 2. Patient is rotated to the right. Percutaneous gastrostomy tube projects over the right upper quadrant. Electronically Signed   By: Agustin Cree M.D.   On: 02/28/2023 13:12   CT CHEST ABDOMEN PELVIS WO CONTRAST  Result Date: 02/27/2023 CLINICAL DATA:  Recent sepsis, discharge from hospital 02/20/2023 EXAM: CT CHEST, ABDOMEN AND PELVIS WITHOUT CONTRAST TECHNIQUE: Multidetector CT imaging of the chest, abdomen  and pelvis was performed following the standard protocol without IV contrast. RADIATION DOSE REDUCTION: This exam was performed according to the departmental dose-optimization program which includes automated exposure control, adjustment of the mA and/or kV according to patient size and/or use of iterative reconstruction technique. COMPARISON:  02/13/2023, 12/10/2022 FINDINGS: CT CHEST FINDINGS Cardiovascular: Unenhanced imaging of the heart is stable without pericardial effusion. Calcification of the mitral annulus. Right internal jugular dialysis catheter tip within the superior vena cava. Normal caliber of the thoracic aorta. Stable atherosclerosis of the aorta and coronary vasculature. Mediastinum/Nodes: No enlarged mediastinal, hilar, or axillary lymph nodes. Thyroid gland, trachea, and esophagus demonstrate no significant findings. Lungs/Pleura: Trace bilateral pleural effusions, left greater than right. Minimal dependent bilateral lower lobe atelectasis. No airspace disease or pneumothorax. Central airways are patent. There is a 6 x 7 mm right lower lobe pulmonary nodule reference image 77/4, unchanged since 12/10/2022. Musculoskeletal: No acute or destructive bony abnormalities. Reconstructed images demonstrate no additional findings. CT ABDOMEN PELVIS FINDINGS Hepatobiliary: Nodular contour of the liver consistent with cirrhosis. No focal parenchymal abnormality identified on this limited unenhanced exam. Stable calcified gallstones without cholecystitis. Pancreas: Unremarkable unenhanced appearance. Spleen: Unremarkable unenhanced appearance. Adrenals/Urinary Tract: Multiple nonobstructing right renal calculi are identified, unchanged in size or number with largest in the upper pole measuring 14 mm. No left-sided calculi. No obstructive uropathy within either kidney. Numerous calcified phleboliths are seen along the course of the right gonadal vein, and should not be confused with ureteral calculi.  Stomach/Bowel: No bowel obstruction or ileus. Normal appendix right lower quadrant. Diverticulosis of the sigmoid colon without diverticulitis. No bowel wall thickening or inflammatory change. Percutaneous gastrostomy tube tip within the gastric lumen, with no evidence of complication. Vascular/Lymphatic: Stable IVC filter. Stable aortic atherosclerosis. No pathologic adenopathy within the abdomen or pelvis. Reproductive: Status post hysterectomy. No adnexal masses. Other: No free fluid or free intraperitoneal gas. Small fat containing hiatal hernia. Musculoskeletal: No acute or destructive bony abnormalities. Reconstructed images demonstrate no additional findings. IMPRESSION: 1. Trace bilateral pleural effusions with minimal dependent lower lobe atelectasis. 2. Cirrhosis. 3. Stable nonobstructing right renal calculi. 4. Cholelithiasis without cholecystitis. 5. Sigmoid diverticulosis without diverticulitis. 6. 6 mm mean diameter right lower lobe pulmonary nodule, stable for 3 months. Non-contrast chest CT at 3-9 months is recommended to document 1 year stability. If the nodule is stable at time of repeat CT, then future CT at 15-21 months (from today's scan) is considered optional for low-risk patients, but is recommended for high-risk patients. This recommendation follows the consensus statement: Guidelines for Management of Incidental Pulmonary Nodules Detected on CT Images: From the Fleischner Society  2017; Radiology 2017; 295:621-308. 7.  Aortic Atherosclerosis (ICD10-I70.0). Electronically Signed   By: Sharlet Salina M.D.   On: 02/27/2023 20:48   DG Chest Port 1 View  Result Date: 02/27/2023 CLINICAL DATA:  Possible sepsis.  Coarse crackles noted bilaterally. EXAM: PORTABLE CHEST 1 VIEW COMPARISON:  One-view chest x-ray 02/13/2023 FINDINGS: Patient is rotated to the right. The left IJ catheter was removed. A new right IJ dialysis catheter is in place. The distal tip is at the cavoatrial junction. The  heart size is normal. Lung volumes are low. Left basilar airspace opacity is noted. No focal airspace disease is present in the right. No edema or effusion is present. The visualized soft tissues and bony thorax are unremarkable. IMPRESSION: 1. Low lung volumes with left basilar airspace disease concerning for pneumonia. 2. New right IJ dialysis catheter without complicating features. Electronically Signed   By: Marin Roberts M.D.   On: 02/27/2023 17:52        Scheduled Meds:  ascorbic acid  500 mg Per Tube BID   atorvastatin  20 mg Per Tube Daily   [START ON 03/01/2023] Chlorhexidine Gluconate Cloth  6 each Topical Q0600   vitamin D3  2,000 Units Per Tube Daily   cholestyramine  4 g Per Tube TID   escitalopram  10 mg Per Tube Daily   famotidine  10 mg Per Tube Daily   [START ON 03/01/2023] feeding supplement (NEPRO CARB STEADY)  1,000 mL Per Tube Q24H   feeding supplement (PROSource TF20)  60 mL Per Tube Daily   fludrocortisone  0.2 mg Per Tube Daily   heparin  5,000 Units Subcutaneous Q8H   hydrOXYzine  100 mg Per Tube BID   metoCLOPramide  5 mg Oral TID AC   midodrine  20 mg Per Tube TID WC   multivitamin  1 tablet Per Tube QHS   nystatin   Topical TID   Continuous Infusions:  ampicillin-sulbactam (UNASYN) IV Stopped (02/28/23 1000)   doxycycline (VIBRAMYCIN) IV Stopped (02/28/23 0909)     LOS: 1 day      Tresa Moore, MD [facility]   If 7PM-7AM, please contact night-coverage  02/28/2023, 1:21 PM

## 2023-02-28 NOTE — Progress Notes (Signed)
Patient seen for nasal suctioning. No distress noted. Resting comfortably on 2liters. BBS noted mild secretions. Able to pass catheter in right nare x2 for moderate amount of white secretions. HR 98/88 RR25/19 o2 saturation noted 95-98%. Patient tolerated interventions well. HOB remains in upright position.

## 2023-02-28 NOTE — ED Notes (Signed)
Pt vomited after admin of midodrine/

## 2023-03-01 ENCOUNTER — Inpatient Hospital Stay: Payer: Medicare Other

## 2023-03-01 DIAGNOSIS — R6521 Severe sepsis with septic shock: Secondary | ICD-10-CM | POA: Diagnosis not present

## 2023-03-01 DIAGNOSIS — N186 End stage renal disease: Secondary | ICD-10-CM

## 2023-03-01 DIAGNOSIS — A419 Sepsis, unspecified organism: Secondary | ICD-10-CM | POA: Diagnosis not present

## 2023-03-01 DIAGNOSIS — E2740 Unspecified adrenocortical insufficiency: Secondary | ICD-10-CM

## 2023-03-01 DIAGNOSIS — Z992 Dependence on renal dialysis: Secondary | ICD-10-CM

## 2023-03-01 LAB — CBC
HCT: 41.6 % (ref 36.0–46.0)
Hemoglobin: 13 g/dL (ref 12.0–15.0)
MCH: 25.4 pg — ABNORMAL LOW (ref 26.0–34.0)
MCHC: 31.3 g/dL (ref 30.0–36.0)
MCV: 81.4 fL (ref 80.0–100.0)
Platelets: 413 10*3/uL — ABNORMAL HIGH (ref 150–400)
RBC: 5.11 MIL/uL (ref 3.87–5.11)
RDW: 15.9 % — ABNORMAL HIGH (ref 11.5–15.5)
WBC: 42.8 10*3/uL — ABNORMAL HIGH (ref 4.0–10.5)
nRBC: 0 % (ref 0.0–0.2)

## 2023-03-01 LAB — T4, FREE: Free T4: 1.22 ng/dL — ABNORMAL HIGH (ref 0.61–1.12)

## 2023-03-01 LAB — COMPREHENSIVE METABOLIC PANEL
ALT: 92 U/L — ABNORMAL HIGH (ref 0–44)
AST: 91 U/L — ABNORMAL HIGH (ref 15–41)
Albumin: 2.5 g/dL — ABNORMAL LOW (ref 3.5–5.0)
Alkaline Phosphatase: 102 U/L (ref 38–126)
Anion gap: 13 (ref 5–15)
BUN: 49 mg/dL — ABNORMAL HIGH (ref 8–23)
CO2: 20 mmol/L — ABNORMAL LOW (ref 22–32)
Calcium: 9.3 mg/dL (ref 8.9–10.3)
Chloride: 101 mmol/L (ref 98–111)
Creatinine, Ser: 4.48 mg/dL — ABNORMAL HIGH (ref 0.44–1.00)
GFR, Estimated: 10 mL/min — ABNORMAL LOW (ref >60)
Glucose, Bld: 158 mg/dL — ABNORMAL HIGH (ref 70–99)
Potassium: 4.9 mmol/L (ref 3.5–5.1)
Sodium: 134 mmol/L — ABNORMAL LOW (ref 135–145)
Total Bilirubin: 0.9 mg/dL (ref <1.2)
Total Protein: 6.4 g/dL — ABNORMAL LOW (ref 6.5–8.1)

## 2023-03-01 LAB — GLUCOSE, CAPILLARY
Glucose-Capillary: 130 mg/dL — ABNORMAL HIGH (ref 70–99)
Glucose-Capillary: 146 mg/dL — ABNORMAL HIGH (ref 70–99)
Glucose-Capillary: 154 mg/dL — ABNORMAL HIGH (ref 70–99)
Glucose-Capillary: 155 mg/dL — ABNORMAL HIGH (ref 70–99)
Glucose-Capillary: 177 mg/dL — ABNORMAL HIGH (ref 70–99)

## 2023-03-01 LAB — TSH: TSH: 2.482 u[IU]/mL (ref 0.350–4.500)

## 2023-03-01 LAB — LACTIC ACID, PLASMA: Lactic Acid, Venous: 2.7 mmol/L (ref 0.5–1.9)

## 2023-03-01 MED ORDER — INSULIN ASPART 100 UNIT/ML IJ SOLN
1.0000 [IU] | INTRAMUSCULAR | Status: DC
  Administered 2023-03-01: 1 [IU] via SUBCUTANEOUS
  Administered 2023-03-01 (×3): 2 [IU] via SUBCUTANEOUS
  Administered 2023-03-02: 1 [IU] via SUBCUTANEOUS
  Filled 2023-03-01 (×5): qty 1

## 2023-03-01 MED ORDER — PIPERACILLIN-TAZOBACTAM IN DEX 2-0.25 GM/50ML IV SOLN
2.2500 g | Freq: Three times a day (TID) | INTRAVENOUS | Status: AC
Start: 2023-03-01 — End: 2023-03-05
  Administered 2023-03-01 – 2023-03-05 (×14): 2.25 g via INTRAVENOUS
  Filled 2023-03-01 (×15): qty 50

## 2023-03-01 MED ORDER — SODIUM CHLORIDE 0.9% FLUSH: 10.0000 mL | INTRAVENOUS | Status: DC | PRN

## 2023-03-01 MED ORDER — SODIUM CHLORIDE 0.9% FLUSH
10.0000 mL | Freq: Two times a day (BID) | INTRAVENOUS | Status: DC
  Administered 2023-03-01 – 2023-03-05 (×11): 10 mL
  Administered 2023-03-06: 20 mL
  Administered 2023-03-06 – 2023-03-07 (×3): 10 mL

## 2023-03-01 MED ORDER — NOREPINEPHRINE 16 MG/250ML-% IV SOLN
0.0000 ug/min | INTRAVENOUS | Status: DC
Start: 2023-03-01 — End: 2023-03-02
  Administered 2023-03-01: 2 ug/min via INTRAVENOUS
  Filled 2023-03-01 (×2): qty 250

## 2023-03-01 MED ORDER — HYDROCORTISONE SOD SUC (PF) 100 MG IJ SOLR
50.0000 mg | Freq: Three times a day (TID) | INTRAMUSCULAR | Status: DC
  Administered 2023-03-01 – 2023-03-02 (×5): 50 mg via INTRAVENOUS
  Filled 2023-03-01 (×5): qty 2

## 2023-03-01 NOTE — Progress Notes (Signed)
       CROSS COVER NOTE  NAME: Alexandra Foster MRN: [id number] DOB : [birth year]    Concern as stated by nurse / staff   Manual BP is 58/43;      Pertinent findings on chart review: 27 yrar old female with ESRD, chronic gtube due to dysphagia from prolonged intubation in pat, AKA adrenal insufficiency, OSA, DM who presented with dyspnea on exertion.  Chest xray initially negative for infiltrates indicataing pneumonia. However she has had worsening leukocytosis and fevers.  She is on midodrine for chronic hypotension. She did not receive her doses today. Overnight on 11/15, patient had aspiration event.  Today her IV access ws lost and IV team unable to secure a new catheter. Her antibiotics were changed to give via her tube. Blood pressures tonight have again dropped significantly when HD was getting ready to be initiated. Concern this is due to worsening infection/sepsis secondary to aspiration pneumonia  Assessment and  Interventions   Assessment:    02/28/2023   11:36 PM 02/28/2023    9:15 PM 02/28/2023    6:52 PM  Vitals with BMI  Systolic 58 142 153  Diastolic 35 61 125  Pulse  93 409   Patient admitted to not feeling well. Respirations diminished. Plan: Transfer to ICU Consult intensivist - need for pressors - Discussed with oncall Webb Silversmith ACNP Husband informed via phone of transfer and likely need for central line       Donnie Mesa NP [facility] Cross Cover 7pm-7am - check amion for availability Pager [phone]

## 2023-03-01 NOTE — Progress Notes (Signed)
PT is hypotensive. On call nephrologist inform about pt's condition. Pt was not hooked up on the HD machine. Also inform md that patient also just got midodriine per G-tube and patient will be transferred to ICU. Per on call md not to do tx at this time.

## 2023-03-01 NOTE — Plan of Care (Signed)
Pt was unable to receive HD b/c BP was too low.  We didn't have IV access.  Was able to give midodrine and doxy through G tube.  B/c of low BP and lack of IV, on call NP was consulted and decided pt needed a higher level of care.  Attempted to call spouse, and only able to leave msg.  Notes say daughter not to be contacted b/c she's on vacation and will be back today.

## 2023-03-01 NOTE — Procedures (Signed)
CENTRAL VENOUS CATHETER PROCEDURE NOTE   Patient Name: Alexandra Foster   MRN: [id number]   Date of Birth/ Sex: 1950/10/18 , female      Admission Date: 02/27/2023  Attending Provider: Tresa Moore, MD  Primary Diagnosis: Dyspnea on exertion    PROCEDURE: U/S Guided Internal jugular central venous catheter   INDICATION (S): Medication administration and Difficult access  PROCEDURE OPERATOR: Webb Silversmith NP  CONSENT: Unable to obtain consent due to emergent nature of procedure.  PROCEDURE SUMMARY: A time-out was performed. The patient's LEFT neck region was prepped and draped in sterile fashion using chlorhexidine scrub. Anesthesia was achieved with 1% lidocaine. The LEFT internal jugular vein was accessed under ultrasound guidance using a finder needle and sheath. U/S images were permanently documented. Venous blood was withdrawn and the sheath was advanced into the vein and the needle was withdrawn. A guidewire was advanced through the sheath. A small incision was made with a 10-blade scalpel and the sheath was exchanged for a dilator over the guidewire until appropriate dilation was obtained. The dilator was removed and an 8.5 [place] central venous TRIPLE-lumen catheter was advanced over the guidewire and secured into place with 4 sutures at 20 cm. At time of procedure completion, all ports aspirated and flushed properly. Post-procedure x-ray shows the tip of the catheter within the superior vena cava.  COMPLICATIONS: None; patient tolerated the procedure well. Chest X-ray is ordered to verify placement for internal jugular or subclavian cannulation.   Chest x-ray is not ordered for femoral cannulation.  ESTIMATED BLOOD LOSS: None   Webb Silversmith, DNP, CCRN, FNP-C, AGACNP-BC Acute Care & Family Nurse Practitioner  [facility]  See Amion for personal pager PCCM on call pager [phone] until 7 am

## 2023-03-01 NOTE — Plan of Care (Signed)
  Problem: Nutrition: Goal: Adequate nutrition will be maintained Outcome: Progressing   Problem: Coping: Goal: Level of anxiety will decrease Outcome: Progressing   Problem: Elimination: Goal: Will not experience complications related to bowel motility Outcome: Progressing   Problem: Fluid Volume: Goal: Hemodynamic stability will improve Outcome: Not Progressing   Problem: Skin Integrity: Goal: Risk for impaired skin integrity will decrease Outcome: Not Progressing   Problem: Respiratory: Goal: Ability to maintain adequate ventilation will improve Outcome: Not Applicable   Problem: Clinical Measurements: Goal: Respiratory complications will improve Outcome: Not Applicable

## 2023-03-01 NOTE — Progress Notes (Signed)
PHARMACY CONSULT NOTE - FOLLOW UP  Pharmacy Consult for Electrolyte Monitoring and Replacement   Recent Labs: Potassium (mmol/L)  Date Value  03/01/2023 4.9  08/11/2013 3.3 (L)   Magnesium (mg/dL)  Date Value  16/01/9603 2.0   Calcium (mg/dL)  Date Value  54/12/8117 9.3   Calcium, Total (mg/dL)  Date Value  14/78/2956 9.2   Albumin (g/dL)  Date Value  21/30/8657 2.5 (L)  08/11/2013 3.1 (L)   Phosphorus (mg/dL)  Date Value  84/69/6295 2.9   Sodium (mmol/L)  Date Value  03/01/2023 134 (L)  08/11/2013 138   Assessment: 72 y.o. 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   Goal of Therapy:  Electrolytes WNL  Plan:  ---no electrolyte replacement warranted for today ---recheck electrolytes in am  Lowella Bandy ,PharmD Clinical Pharmacist 03/01/2023 11:44 AM

## 2023-03-01 NOTE — Consult Note (Signed)
NAME:  Alexandra Foster, MRN:  [id number], DOB:  March 14, 1951, LOS: 2 ADMISSION DATE:  02/27/2023, CONSULTATION DATE:  03/01/2023 REFERRING MD:  Manuela Schwartz REASON FOR CONSULT:  Hypotension   HPI  72 y.o with significant PMH of Asthma, COPD, type diabetes mellitus, s/p G-tube, GERD, ESRD on HD(TuThSa), hypertension, osteoarthritis, paroxysmal atrial fibrillation and OSA on CPAP who was sent to the ED from her PCP's office for evaluation of possible sepsis.  Patient has had multiple admission this year with different medical issues most recently on 8/23/24with Acute Metabolic Encephalopathy in setting of Severe Sepsis with Septic Shock due to MRSA Bacteremia from infected right chest Permcath, along with Enterococcus Faecalis UTI and TIA.   ED Course: Initial vital signs showed HR of 64 beats/minute, BP 108/82 mm Hg, the RR 23 breaths/minute, and the oxygen saturation 99% on RA and a temperature of 98.78F (37.0C).  Pertinent Labs/Diagnostics Findings: Na+/ K+: 133/3.3 Glucose: 89 BUN/Cr.: 27/2.30  AST/ALT:59/54 WBC: 15.7  PCT: 0.22  Lactic acid: 1.2 CXR> CTA Chest Abd/pelvis>see results Medication administered in the WU:JWJX Disposition:Hospitalist consulted for admission to medsurg unit. Patient was started on broad spectrum abx for suspected sepsis of unknown source.  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 PCCM consulted  Consults:  Nephrology PCCM  Procedures:  11/17: Left internal jugular Central Line  Significant Diagnostic Tests:  11/17: Chest  Xray> FINDINGS: Left internal jugular central line placement with the tip in the SVC. Right dialysis catheter is unchanged. No pneumothorax. Heart and mediastinal contours are within normal limits. No confluent opacities, effusions or overt edema. No acute bony abnormality.  11/15: Chest Xray> IMPRESSION: 1. Low lung volumes with left basilar airspace disease concerning for pneumonia. 2. New right IJ dialysis catheter without complicating features.  11/15: CTA Chest, abdomen and pelvis> IMPRESSION: 1. Trace bilateral pleural effusions with minimal dependent lower lobe atelectasis. 2. Cirrhosis. 3. Stable nonobstructing right renal calculi. 4. Cholelithiasis without cholecystitis. 5. Sigmoid diverticulosis without diverticulitis. 6. 6 mm mean diameter right lower lobe pulmonary nodule, stable for 3 months. Non-contrast chest CT at 3-9 months is recommended to document 1 year stability. If the nodule is stable at time of repeat CT, then future CT at 15-21 months (from today's scan) is considered optional for low-risk patients, but is recommended for high-risk patients. This recommendation follows the consensus statement: Guidelines for Management of Incidental Pulmonary Nodules Detected on CT Images: From the Fleischner Society 2017; Radiology 2017; 284:228-243. 7.  Aortic Atherosclerosis (ICD10-I70.0).  Interim History / Subjective:      Micro Data:  11/15: Blood culture x2>No growth  Antimicrobials:  11/16 Unasyn >>  11/16 Vancomycin  OBJECTIVE  Blood pressure (!) 58/35, pulse 93, temperature 99.2 F (37.3 C), temperature source Oral, resp. rate 18, SpO2 100%.      Intake/Output Summary (Last 24 hours) at 03/01/2023 0147 Last data filed at 02/28/2023 1520 Gross per 24 hour  Intake 550 ml  Output --  Net 550 ml   There were no vitals filed for this visit.  Physical Examination  GENERAL:  year-old critically ill patient lying in the bed  EYES:  PEERLA. No  scleral icterus. Extraocular muscles intact.  HEENT: Head atraumatic, normocephalic. Oropharynx and nasopharynx clear.  NECK:  No JVD, supple  LUNGS: Decreased breath sounds bilaterally.  No use of accessory muscles of respiration.  CARDIOVASCULAR: S1, S2 normal. No murmurs, rubs, or gallops.  ABDOMEN: Soft, NTND EXTREMITIES: LBKA. Capillary refill < 3 seconds in all extremities. Pulses palpable distally. NEUROLOGIC: The patient is . No focal neurological deficit appreciated. Cranial nerves are intact.  SKIN:          Labs/imaging that I havepersonally reviewed  (right click and "Reselect all SmartList Selections" daily)     Labs   CBC: Recent Labs  Lab 02/27/23 1449 02/28/23 0621  WBC 15.7* 32.3*  NEUTROABS 8.8*  --   HGB 12.4 14.1  HCT 39.8 45.9  MCV 82.4 83.6  PLT 388 367    Basic Metabolic Panel: Recent Labs  Lab 02/27/23 1449 02/28/23 0621  NA 133* 134*  K 3.3* 4.0  CL 100 100  CO2 24 21*  GLUCOSE 89 137*  BUN 27* 33*  CREATININE 2.30* 3.07*  CALCIUM 9.2 9.4   GFR: Estimated Creatinine Clearance: 17 mL/min (A) (by C-G formula based on SCr of 3.07 mg/dL (H)). Recent Labs  Lab 02/27/23 1449 02/27/23 1453 02/28/23 0621  PROCALCITON  --  0.22 13.10  WBC 15.7*  --  32.3*  LATICACIDVEN 1.8  --   --     Liver Function Tests: Recent Labs  Lab 02/27/23 1449  AST 59*  ALT 74*  ALKPHOS 157*  BILITOT 0.5  PROT 7.3  ALBUMIN 3.0*   No results for input(s): "LIPASE", "AMYLASE" in the last 168 hours. No results for input(s): "AMMONIA" in the last 168 hours.  ABG    Component Value Date/Time   PHART 7.40 05/14/2021 0341   PCO2ART 45 05/14/2021 0341   PO2ART 129 (H) 05/14/2021 0341   HCO3 21.6 02/14/2023 0937   ACIDBASEDEF 4.3 (H) 02/14/2023 0937   O2SAT 87 02/14/2023 0937     Coagulation Profile: Recent Labs  Lab 02/27/23 1449  INR 1.0    Cardiac Enzymes: No results for input(s): "CKTOTAL", "CKMB", "CKMBINDEX", "TROPONINI" in the last  168 hours.  HbA1C: Hgb A1c MFr Bld  Date/Time Value Ref Range Status  02/11/2023 02:55 PM 4.7 <5.7 % of total Hgb Final    Comment:    For the purpose of screening for the presence of diabetes: . <5.7%       Consistent with the absence of diabetes 5.7-6.4%    Consistent with increased risk for diabetes             (prediabetes) > or =6.5%  Consistent with diabetes . This assay result is consistent with a decreased risk of diabetes. . Currently, no consensus exists regarding use of hemoglobin A1c for diagnosis of diabetes in children. . According to American Diabetes Association (ADA) guidelines, hemoglobin A1c <7.0% represents optimal control in non-pregnant diabetic patients. Different metrics may apply to specific patient populations.  Standards of Medical Care in Diabetes(ADA). [name]   12/07/2022 02:13 AM 5.2 4.8 - 5.6 % Final    Comment:    (NOTE) Pre diabetes:          5.7%-6.4%  Diabetes:              >6.4%  Glycemic control for   <7.0% adults with diabetes     CBG: Recent Labs  Lab 03/01/23 0009  GLUCAP 130*    Review of Systems:  Unable to be obtained secondary to the patient's altered mental status.    Past Medical History  She,  has a past medical history of AKI (acute kidney injury) (HCC), Arthritis, Asthma, Bacteremia, COPD (chronic obstructive pulmonary disease) (HCC), Diabetes mellitus without complication (HCC), Endometriosis, GERD (gastroesophageal reflux disease), History of echocardiogram, Hypertension, Obesity, PAF (paroxysmal atrial fibrillation) (HCC), and Sleep apnea.   Surgical History    Past Surgical History:  Procedure Laterality Date   ABDOMINAL HYSTERECTOMY     APPLICATION OF WOUND VAC Left 05/17/2021   Procedure: APPLICATION OF WOUND VAC/WOUND VAC EXCHANGE-Matrix Myriad;  Surgeon: Carolan Shiver, MD;  Location: [facility];  Service: General;  Laterality: Left;   APPLICATION OF WOUND VAC  05/24/2021   Procedure: APPLICATION OF  WOUND VAC;  Surgeon: Carolan Shiver, MD;  Location: [facility];  Service: General;;   APPLICATION OF WOUND VAC  05/10/2021   Procedure: APPLICATION OF WOUND VAC;  Surgeon: Carolan Shiver, MD;  Location: [facility];  Service: General;;   COLONOSCOPY  10/29/2006   Dr Servando Snare   COLONOSCOPY WITH PROPOFOL N/A 11/05/2016   Procedure: COLONOSCOPY WITH PROPOFOL;  Surgeon: Earline Mayotte, MD;  Location: [facility];  Service: Endoscopy;  Laterality: N/A;   DIALYSIS/PERMA CATHETER INSERTION N/A 12/19/2022   Procedure: DIALYSIS/PERMA CATHETER INSERTION;  Surgeon: Annice Needy, MD;  Location: [facility];  Service: Cardiovascular;  Laterality: N/A;   DIALYSIS/PERMA CATHETER INSERTION N/A 02/04/2023   Procedure: DIALYSIS/PERMA CATHETER INSERTION;  Surgeon: Annice Needy, MD;  Location: [facility];  Service: Cardiovascular;  Laterality: N/A;   DIALYSIS/PERMA CATHETER INSERTION N/A 02/16/2023   Procedure: DIALYSIS/PERMA CATHETER INSERTION;  Surgeon: Annice Needy, MD;  Location: [facility];  Service: Cardiovascular;  Laterality: N/A;   INCISION AND DRAINAGE OF WOUND Left 05/24/2021   Procedure: IRRIGATION AND DEBRIDEMENT LEFT LEG;  Surgeon: Carolan Shiver, MD;  Location: [facility];  Service: General;  Laterality: Left;   INCISION AND DRAINAGE OF WOUND Left 05/10/2021   Procedure: IRRIGATION AND DEBRIDEMENT LEFT LEG;  Surgeon: Carolan Shiver, MD;  Location: [facility];  Service: General;  Laterality: Left;   IR FLUORO GUIDE CV LINE RIGHT  06/12/2021   IR PERC TUN PERIT CATH WO PORT S&I /IMAG  06/12/2021   IR REPLC GASTRO/COLONIC TUBE PERCUT W/FLUORO  06/12/2021   IVC FILTER INSERTION N/A 05/29/2021   Procedure: IVC FILTER INSERTION;  Surgeon: Renford Dills, MD;  Location: [facility];  Service: Cardiovascular;  Laterality: N/A;   MINOR GRAFT APPLICATION  05/24/2021   Procedure: Myriad Matrix  APPLICATION;  Surgeon: Carolan Shiver, MD;  Location: [facility]  [facility];  Service: General;;   NASAL SINUS SURGERY  2002   Dr Chestine Spore   PEG PLACEMENT N/A 05/28/2021   Procedure: PERCUTANEOUS ENDOSCOPIC GASTROSTOMY (PEG) PLACEMENT;  Surgeon: Sung Amabile, DO;  Location: [facility];  Service: General;  Laterality: N/A;  TRAVEL CASE   TRACHEOSTOMY TUBE PLACEMENT N/A 05/17/2021   Procedure: TRACHEOSTOMY;  Surgeon: Geanie Logan, MD;  Location: [facility];  Service: ENT;  Laterality: N/A;   WOUND DEBRIDEMENT Left 05/07/2021   Procedure: DEBRIDEMENT WOUND;  Surgeon: Carolan Shiver, MD;  Location: [facility];  Service: General;  Laterality: Left;     Social History   reports that she quit smoking about 21 years ago. Her smoking use included cigarettes. She started smoking about 61 years ago. She has a 80 pack-year smoking history. She quit smokeless tobacco use about 21 years ago.  Her smokeless  tobacco use included snuff. She reports that she does not drink alcohol and does not use drugs.   Family History   Her family history includes Alcohol abuse in her brother; Breast cancer (age of onset: 13) in her sister; Congestive Heart Failure in her mother; Coronary artery disease (age of onset: 49) in her father; Heart disease in her brother; Varicose Veins in her brother.   Allergies Allergies  Allergen Reactions   Chlorhexidine Dermatitis     Home Medications  Prior to Admission medications   Medication Sig Start Date End Date Taking? Authorizing Provider  ascorbic acid (VITAMIN C) 500 MG tablet Place 1 tablet (500 mg total) into feeding tube 2 (two) times daily. 06/13/21  Yes Erin Fulling, MD  atorvastatin (LIPITOR) 20 MG tablet Place 1 tablet (20 mg total) into feeding tube daily. 02/12/23  Yes Sowles, [name], MD  cholestyramine Lanetta Inch) 4 g packet Place 1 packet (4 g total) into feeding tube 3 (three) times daily. 02/11/23  Yes Sowles, [name], MD  clonazePAM (KLONOPIN) 0.5 MG tablet Take 0.5-1 tablets (0.25-0.5 mg total) by mouth daily as needed for anxiety.  02/11/23  Yes Sowles, [name], MD  escitalopram (LEXAPRO) 10 MG tablet Place 1 tablet (10 mg total) into feeding tube daily. 02/11/23  Yes Sowles, [name], MD  famotidine (PEPCID) 10 MG tablet Place 1 tablet (10 mg total) into feeding tube daily. 02/11/23  Yes Sowles, [name], MD  fludrocortisone (FLORINEF) 0.1 MG tablet Place 2 tablets (0.2 mg total) into feeding tube daily. 12/30/22  Yes Wieting, Richard, MD  hydrOXYzine (ATARAX) 50 MG tablet Take 2 tablets (100 mg total) by mouth 2 (two) times daily. Home med. 02/20/23  Yes [name], MD  metoCLOPramide (REGLAN) 5 MG tablet Take 1 tablet (5 mg total) by mouth 3 (three) times daily before meals. 02/11/23  Yes Sowles, [name], MD  midodrine (PROAMATINE) 10 MG tablet Place 2 tablets (20 mg total) into feeding tube 3 (three) times daily with meals. 02/12/23  Yes Sowles, [name], MD  multivitamin (RENA-VIT) TABS tablet Place 1 tablet into feeding tube at bedtime. 02/04/23  Yes Sowles, [name], MD  traZODone (DESYREL) 50 MG tablet Take 0.5 tablets (25 mg total) by mouth at bedtime as needed for sleep. 02/11/23  Yes Sowles, [name], MD  vitamin D3 (CHOLECALCIFEROL) 25 MCG tablet Place 2 tablets (2,000 Units total) into feeding tube daily. 01/02/23  Yes Enedina Finner, MD  acetaminophen (TYLENOL) 325 MG tablet Place 2 tablets (650 mg total) into feeding tube every 6 (six) hours as needed for mild pain (or Fever >/= 101). 12/29/22   Alford Highland, MD  amiodarone (PACERONE) 200 MG tablet Hold due to low heart rate, pending outpatient cardiology followup. Patient not taking: Reported on 02/23/2023 02/20/23   [name], MD  diphenoxylate-atropine (LOMOTIL) 2.5-0.025 MG tablet 1 tablet by Per J Tube route 4 (four) times daily as needed for diarrhea or loose stools. Patient not taking: Reported on 02/23/2023 12/29/22   Alford Highland, MD  glucose blood (ACCU-CHEK GUIDE) test strip Use as instructed 02/23/23   [name], MD  liver oil-zinc oxide (DESITIN) 40 %  ointment Apply topically 3 (three) times daily as needed for irritation. Apply to skin around G tube 3 times daily as needed for skin irritation 02/20/23   [name], MD  Nutritional Supplements (FEEDING SUPPLEMENT, NEPRO CARB STEADY,) LIQD Place 1,120 mLs into feeding tube daily. 12/29/22   Alford Highland, MD  Nutritional Supplements (FEEDING SUPPLEMENT, NEPRO CARB STEADY,) LIQD Place 1,120 mLs  into feeding tube daily. 01/02/23   Enedina Finner, MD  nystatin (MYCOSTATIN/NYSTOP) powder Apply topically 3 (three) times daily. 12/29/22   Alford Highland, MD  polyvinyl alcohol (LIQUIFILM TEARS) 1.4 % ophthalmic solution Place 1 drop into both eyes as needed for dry eyes. Patient not taking: Reported on 02/23/2023 12/29/22   Alford Highland, MD  Water For Irrigation, Sterile (FREE WATER) SOLN Place 50 mLs into feeding tube every 4 (four) hours. 12/29/22   Alford Highland, MD  Scheduled Meds:  ascorbic acid  500 mg Per Tube BID   atorvastatin  20 mg Per Tube Daily   vitamin D3  2,000 Units Per Tube Daily   cholestyramine  4 g Per Tube TID   doxycycline  100 mg Per Tube Q12H   escitalopram  10 mg Per Tube Daily   famotidine  10 mg Per Tube Daily   feeding supplement (NEPRO CARB STEADY)  1,000 mL Per Tube Q24H   feeding supplement (PROSource TF20)  60 mL Per Tube Daily   fludrocortisone  0.2 mg Per Tube Daily   heparin  5,000 Units Subcutaneous Q8H   hydrocortisone sod succinate (SOLU-CORTEF) inj  50 mg Intravenous Q8H   hydrOXYzine  100 mg Per Tube BID   lipase/protease/amylase)  20,880 Units Per Tube Once   And   sodium bicarbonate  650 mg Per Tube Once   metoCLOPramide (REGLAN) injection  10 mg Intravenous Q8H   midodrine  20 mg Per Tube TID WC   multivitamin  1 tablet Per Tube QHS   nystatin   Topical TID   sodium chloride flush  10-40 mL Intracatheter Q12H   Continuous Infusions:  ampicillin-sulbactam (UNASYN) IV Stopped (02/28/23 1000)   anticoagulant sodium citrate     norepinephrine  (LEVOPHED) Adult infusion 6 mcg/min (03/01/23 0200)   [START ON 03/03/2023] vancomycin     PRN Meds:.acetaminophen **OR** acetaminophen, alteplase, anticoagulant sodium citrate, bisacodyl, clonazePAM, heparin, heparin, ipratropium-albuterol, lidocaine (PF), lidocaine-prilocaine, liver oil-zinc oxide, LORazepam, ondansetron **OR** ondansetron (ZOFRAN) IV, pentafluoroprop-tetrafluoroeth, senna-docusate, sodium chloride flush, traZODone  Active Hospital Problem list   See systems below  Assessment & Plan:  #Septic Shock vs Chronic Hypotension (on midodrine outpatient) #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 (IVC filter placed 05/29/21) PMHx: Paroxsymal Atrial Fibrillation, HTN, HLD Echocardiogram 02/15/2023: LVEF 60-65%, RV systolic function low normal, RV size mildly enlarged, trivial mitral regurgitation -Check Lactate -Vasopressors as needed to maintain MAP goal -continue Midodrine to 20 mg TID, -start stress dose steroids -Continue Atorvastatin -Hold outpatient amiodarone   #Sepsis due to Suspected Recurrent Aspiration  Recent MRSA BACTEREMIA due to infected Permcath & Enterococcus Faecalis UTI Permcath was removed on 8/24 -Trend Lactate, procal, WBC and monitor fever curve  -continue vancomycin pending cultures & sensitivities  will switch to Zosyn for broader coverage -ID previously recommended TEE ~ pts family declined due to concerns of pt receiving sedation~ ID previously recommended 6 weeks of Vancomycin if unable to undergo TEE -May consider re-consulting ID if no improvement  worsening leukocytosis and procal   #ESRD on Hemodialysis TTHS #Mild Hyponatremia #Hypokalemia  Unable to complete HD 02/28/23 due to hypotension -Trend BMP  -Ensure adequate renal perfusion -Replace electrolytes as indicated ~ Pharmacy following for assistance with electrolyte replacement -Nephrology following, appreciate input: HD per recommendations   #COPD without acute  exacerbation #Concern for aspiration #OSA, uses CPAP at home -Supplemental O2 as needed to maintain O2 sats 88 to 92% -CPAP qhs -Follow intermittent CXR & ABG as needed -Bronchodilators prn -Pulmonary  toilet as able -ABX as above   #Anemia of chronic kidney disease -Trend CBC -Monitor for s/sx of bleeding  -VTE prophylaxis: subcutaneous heparin  -Transfuse for Hgb <7   #Hx of Adrenal Insufficiency -Repeat Cortisol is 51.0  (previously 35.9) ~ continue Florinef -Check TSH and free T4    #Severe irritant dermatitis and Skin tears at abdominal folds and buttocks  - Turn q2hrs  - Wound care consult    #Type 2 diabetes mellitus - CBG's q4h; Target range of 140 to 180 - SSI - Follow ICU Hypo/Hyperglycemia protocol - Hold outpatient metformin  #Anxiety and depression -Resume home Lexapro 10 mg daily, hydroxyzine 100 mg twice daily, clonazepam 0.5 mg daily as needed   Best practice:  Diet:  Tube Feed  Pain/Anxiety/Delirium protocol (if indicated): No VAP protocol (if indicated): Not indicated DVT prophylaxis: Subcutaneous Heparin GI prophylaxis: H2B Glucose control:  SSI Yes Central venous access:  Yes, and it is still needed Arterial line:  N/A Foley:  N/A Mobility:  bed rest  PT consulted: N/A Last date of multidisciplinary goals of care discussion [11/17] Code Status:  full code Disposition: ICU   = Goals of Care = Code Status Order: FULL  Primary Emergency Contact: Rosenfield,[name], Home Phone: [phone] Wishes to pursue full aggressive treatment and intervention options, including CPR and intubation, but goals of care will be addressed on going with family if that should become necessary.   Critical care time: 45 minutes        Webb Silversmith DNP, CCRN, FNP-C, AGACNP-BC Acute Care & Family Nurse Practitioner [facility] PCCM on call pager [phone]

## 2023-03-01 NOTE — Significant Event (Signed)
Patient became hypotensive during HD yesterday requiring urgent transfer to ICU, placement of central venous access, initiation of vasopressors  Discussed case with PCCM attending.  PCCM to assume primary care of patient at this time.  [facility] hospitalist service to sign off, please contact our service when patient stable for transfer back to general medical service  Lolita Patella MD  No charge

## 2023-03-01 NOTE — TOC Initial Note (Signed)
Transition of Care [facility]) - Initial/Assessment Note    Patient Details  Name: Alexandra Foster MRN: [id number] Date of Birth: 1950-06-18  [facility]) CM/SW Contact:    Colette Ribas, LCSWA Phone Number: 03/01/2023, 11:16 AM  Clinical Narrative:                  CSW reached out to patient for HRA, unable to complete at this time.        Patient Goals and CMS Choice            Expected Discharge Plan and Services                                              Prior Living Arrangements/Services                       Activities of Daily Living   ADL Screening (condition at time of admission) Independently performs ADLs?: No Is the patient deaf or have difficulty hearing?: No Does the patient have difficulty seeing, even when wearing glasses/contacts?: No Does the patient have difficulty concentrating, remembering, or making decisions?: Yes  Permission Sought/Granted                  Emotional Assessment              Admission diagnosis:  Dyspnea on exertion [R06.09] Dyspnea and respiratory abnormalities [R06.00, R06.89] Dyspnea, unspecified type [R06.00] Patient Active Problem List   Diagnosis Date Noted   Dyspnea and respiratory abnormalities 02/28/2023   Dyspnea on exertion 02/27/2023   Hx of AKA (above knee amputation), left (HCC) 02/27/2023   Truncal obesity 02/27/2023   Gastrostomy tube in place (HCC) 02/15/2023   Pressure injury of skin 02/15/2023   Septic shock (HCC) 02/14/2023   Aspiration pneumonia (HCC) 02/13/2023   Adrenal insufficiency (HCC) 02/13/2023   Malnutrition (HCC) 02/11/2023   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   Cholelithiasis without cholecystitis 02/11/2023   Bowel and bladder incontinence 02/11/2023   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   Diarrhea 12/28/2022   Dysphagia 12/26/2022   Anemia of chronic disease 12/24/2022   Anxiety and depression  12/06/2022   Type 2 diabetes mellitus with chronic kidney disease, with long-term current use of insulin (HCC) 12/06/2022   ESRD on hemodialysis (HCC) 12/06/2022   Subdural hemorrhage (HCC) 05/18/2021   Atrial fibrillation and flutter (HCC)    Sepsis (HCC) 05/04/2021   Personal history of COVID-19 07/14/2019   Osteoarthritis of both knees 01/14/2018   Urge incontinence 03/14/2016   Osteopenia 02/13/2016   OSA (obstructive sleep apnea) 01/08/2016   Allergic rhinitis 04/13/2015   COPD (chronic obstructive pulmonary disease) (HCC) 09/18/2014   Esophagitis, reflux 09/18/2014   PCP:  [name], MD Pharmacy:   [facility] [address], [place] - [address] [place] [address] Phone: [phone] Fax: [phone]     Social Determinants of Health (SDOH) Social History: SDOH Screenings   Food Insecurity: No Food Insecurity (02/28/2023)  Housing: Patient Unable To Answer (02/23/2023)  Recent Concern: Housing - High Risk (12/06/2022)  Transportation Needs: No Transportation Needs (02/23/2023)  Utilities: Not At Risk (02/23/2023)  Alcohol Screen: Low Risk  (02/11/2021)  Depression (PHQ2-9): Medium Risk (02/27/2023)  Financial Resource Strain: Medium Risk (08/09/2020)  Physical Activity: Inactive (08/09/2020)  Social Connections: Moderately Integrated (08/09/2020)  Stress: No Stress Concern Present (08/09/2020)  Tobacco Use: Medium Risk (02/27/2023)   SDOH Interventions:     Readmission Risk Interventions    02/16/2023   12:26 PM 12/26/2022   11:15 AM  Readmission Risk Prevention Plan  Transportation Screening Complete Complete  PCP or Specialist Appt within 3-5 Days Complete   HRI or Home Care Consult Complete   Social Work Consult for Recovery Care Planning/Counseling Complete   Palliative Care Screening Not Applicable   Medication Review Oceanographer) Complete Complete  PCP or Specialist appointment within 3-5 days of discharge   Complete  HRI or Home Care Consult  Complete  SW Recovery Care/Counseling Consult  Complete  Palliative Care Screening  Not Applicable  Skilled Nursing Facility  Not Applicable

## 2023-03-01 NOTE — Progress Notes (Signed)
Brief Progress Note:  Patient's behind was examined while nurses were performing nursing care. There are no sign of significant pressure ulcers, but rather skin tears. Will continue with wound care as prescribed.

## 2023-03-01 NOTE — Progress Notes (Signed)
Pharmacy Antibiotic Note  Alexandra Foster is a 72 y.o. 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  Pharmacy has been consulted for Unasyn and Vancomycin dosing.  Plan: Unasyn 3 grams IV every 24 hours follow renal function for needed dose adjustments Height: 5\' 4"  (162.6 cm) Weight: 85.5 kg (188 lb 7.9 oz) IBW/kg (Calculated) : 54.72. Vancomycin 1000 mg IV given 11/15 @ 1949, Will give Vancomycin 1000 mg IV today to complete loading dose, then 1000 mg IV after each HD session.  3. Draw level around 4th to 5th dose if needed 4. Follow cultures and adjust therapy as needed  11/17 Unasyn D/C, Pharmacy to dose Zosyn for Sepsis Zosyn 2.25 gm q8h per indication and ESRD on HD.  Temp (24hrs), Avg:99.2 F (37.3 C), Min:98.1 F (36.7 C), Max:100.7 F (38.2 C)  Recent Labs  Lab 02/27/23 1449 02/28/23 0621 03/01/23 0255  WBC 15.7* 32.3* 42.8*  CREATININE 2.30* 3.07* 4.48*  LATICACIDVEN 1.8  --  2.7*    Estimated Creatinine Clearance: 11.7 mL/min (A) (by C-G formula based on SCr of 4.48 mg/dL (H)).    Allergies  Allergen Reactions   Chlorhexidine Dermatitis    Antimicrobials this admission: 11/16 Unasyn >> 11/17 11/16 Vancomycin 11/17 Zosyn >>  Microbiology results: 11/16 BCx: NGTD  Thank you for allowing pharmacy to be a part of this patient's care.  Otelia Sergeant, PharmD, [facility] 03/01/2023 6:41 AM

## 2023-03-01 NOTE — Progress Notes (Addendum)
[facility]  PROGRESS NOTE   Subjective:   Patient is known to our practice from previous admissions.  She was recently discharged on November 8.  She is brought back to the hospital by family due to concerns of coughing, difficulty breathing, concern of infection around the G-tube.    Evaluation in the ER shows elevated white count of 15.7 that has increased to 32.3 today.  Hemoglobin is in the normal range.  Electrolytes panel show normal potassium.  BUN level of 33.  Blood glucose of 137.  Blood cultures have been performed and are pending.  Overnight patient became hypotensive.  Dialysis was canceled because of hemodynamic instability.  She was transferred to the ICU.  This morning she is lethargic but able to follow simple commands.  She is currently on Levophed at 10.  Getting broad-spectrum antibiotics vancomycin and Zosyn for presumptive aspiration pneumonia.  Objective:  Vital signs: Blood pressure (!) 92/57, pulse 84, temperature 98.5 F (36.9 C), temperature source Axillary, resp. rate 17, height 5\' 4"  (1.626 m), weight 85.5 kg, SpO2 96%.  Intake/Output Summary (Last 24 hours) at 03/01/2023 0944 Last data filed at 03/01/2023 0600 Gross per 24 hour  Intake 333.67 ml  Output --  Net 333.67 ml    Filed Weights   03/01/23 0045  Weight: 85.5 kg      Physical Exam: General:  No acute distress  Head:  Normocephalic, atraumatic. Moist oral mucosal membranes  Eyes:  Anicteric  Lungs:   normal effort, coarse breath sounds  Heart:  S1S2 no rubs  Abdomen:   Soft, nontender, bowel sounds present, G-tube in place  Extremities: Trace to 1+ peripheral edema.  Neurologic: Lethargic but able to follow simple commands  Skin:  No lesions  Access: Rt chest permcath/Dr. Dew/02/16/2023    Basic Metabolic Panel: Recent Labs  Lab 02/27/23 1449 02/28/23 0621 03/01/23 0255  NA 133* 134* 134*  K 3.3* 4.0 4.9  CL 100 100 101  CO2 24 21* 20*  GLUCOSE 89 137* 158*   BUN 27* 33* 49*  CREATININE 2.30* 3.07* 4.48*  CALCIUM 9.2 9.4 9.3   GFR: Estimated Creatinine Clearance: 11.7 mL/min (A) (by C-G formula based on SCr of 4.48 mg/dL (H)).  Liver Function Tests: Recent Labs  Lab 02/27/23 1449 03/01/23 0255  AST 59* 91*  ALT 74* 92*  ALKPHOS 157* 102  BILITOT 0.5 0.9  PROT 7.3 6.4*  ALBUMIN 3.0* 2.5*   No results for input(s): "LIPASE", "AMYLASE" in the last 168 hours. No results for input(s): "AMMONIA" in the last 168 hours.   CBC: Recent Labs  Lab 02/27/23 1449 02/28/23 0621 03/01/23 0255  WBC 15.7* 32.3* 42.8*  NEUTROABS 8.8*  --   --   HGB 12.4 14.1 13.0  HCT 39.8 45.9 41.6  MCV 82.4 83.6 81.4  PLT 388 367 413*     HbA1C: Hgb A1c MFr Bld  Date/Time Value Ref Range Status  02/11/2023 02:55 PM 4.7 <5.7 % of total Hgb Final    Comment:    For the purpose of screening for the presence of diabetes: . <5.7%       Consistent with the absence of diabetes 5.7-6.4%    Consistent with increased risk for diabetes             (prediabetes) > or =6.5%  Consistent with diabetes . This assay result is consistent with a decreased risk of diabetes. . Currently, no consensus exists regarding use of hemoglobin A1c for diagnosis  of diabetes in children. . According to American Diabetes Association (ADA) guidelines, hemoglobin A1c <7.0% represents optimal control in non-pregnant diabetic patients. Different metrics may apply to specific patient populations.  Standards of Medical Care in Diabetes(ADA). [name]   12/07/2022 02:13 AM 5.2 4.8 - 5.6 % Final    Comment:    (NOTE) Pre diabetes:          5.7%-6.4%  Diabetes:              >6.4%  Glycemic control for   <7.0% adults with diabetes     Urinalysis: No results for input(s): "COLORURINE", "LABSPEC", "PHURINE", "GLUCOSEU", "HGBUR", "BILIRUBINUR", "KETONESUR", "PROTEINUR", "UROBILINOGEN", "NITRITE", "LEUKOCYTESUR" in the last 72 hours.  Invalid input(s): "APPERANCEUR"      Imaging: DG Chest Port 1 View  Result Date: 03/01/2023 CLINICAL DATA:  Central line placement EXAM: PORTABLE CHEST 1 VIEW COMPARISON:  02/27/2023 FINDINGS: Left internal jugular central line placement with the tip in the SVC. Right dialysis catheter is unchanged. No pneumothorax. Heart and mediastinal contours are within normal limits. No confluent opacities, effusions or overt edema. No acute bony abnormality. IMPRESSION: Left central line tip in the SVC.  No pneumothorax. Electronically Signed   By: Charlett Nose M.D.   On: 03/01/2023 01:19   DG Abd 1 View  Result Date: 02/28/2023 CLINICAL DATA:  Abdominal pain with leaking gastrostomy tube EXAM: ABDOMEN - 1 VIEW COMPARISON:  Abdominal radiograph dated 12/31/2022 FINDINGS: Patient is rotated to the right. Percutaneous gastrostomy tube projects over the right upper quadrant. Nonspecific bowel gas pattern. Large volume stool within the ascending colon. No free air or pneumatosis. No abnormal radio-opaque calculi or mass effect. No acute or substantial osseous abnormality. The sacrum and coccyx are partially obscured by overlying bowel contents. IVC filter in-situ. Partially imaged central venous catheter tip projects over the superior cavoatrial junction. IMPRESSION: 1. Nonspecific bowel gas pattern. Large volume stool within the ascending colon. 2. Patient is rotated to the right. Percutaneous gastrostomy tube projects over the right upper quadrant. Electronically Signed   By: Agustin Cree M.D.   On: 02/28/2023 13:12   CT CHEST ABDOMEN PELVIS WO CONTRAST  Result Date: 02/27/2023 CLINICAL DATA:  Recent sepsis, discharge from hospital 02/20/2023 EXAM: CT CHEST, ABDOMEN AND PELVIS WITHOUT CONTRAST TECHNIQUE: Multidetector CT imaging of the chest, abdomen and pelvis was performed following the standard protocol without IV contrast. RADIATION DOSE REDUCTION: This exam was performed according to the departmental dose-optimization program which includes  automated exposure control, adjustment of the mA and/or kV according to patient size and/or use of iterative reconstruction technique. COMPARISON:  02/13/2023, 12/10/2022 FINDINGS: CT CHEST FINDINGS Cardiovascular: Unenhanced imaging of the heart is stable without pericardial effusion. Calcification of the mitral annulus. Right internal jugular dialysis catheter tip within the superior vena cava. Normal caliber of the thoracic aorta. Stable atherosclerosis of the aorta and coronary vasculature. Mediastinum/Nodes: No enlarged mediastinal, hilar, or axillary lymph nodes. Thyroid gland, trachea, and esophagus demonstrate no significant findings. Lungs/Pleura: Trace bilateral pleural effusions, left greater than right. Minimal dependent bilateral lower lobe atelectasis. No airspace disease or pneumothorax. Central airways are patent. There is a 6 x 7 mm right lower lobe pulmonary nodule reference image 77/4, unchanged since 12/10/2022. Musculoskeletal: No acute or destructive bony abnormalities. Reconstructed images demonstrate no additional findings. CT ABDOMEN PELVIS FINDINGS Hepatobiliary: Nodular contour of the liver consistent with cirrhosis. No focal parenchymal abnormality identified on this limited unenhanced exam. Stable calcified gallstones without cholecystitis. Pancreas: Unremarkable unenhanced appearance. Spleen: Unremarkable  unenhanced appearance. Adrenals/Urinary Tract: Multiple nonobstructing right renal calculi are identified, unchanged in size or number with largest in the upper pole measuring 14 mm. No left-sided calculi. No obstructive uropathy within either kidney. Numerous calcified phleboliths are seen along the course of the right gonadal vein, and should not be confused with ureteral calculi. Stomach/Bowel: No bowel obstruction or ileus. Normal appendix right lower quadrant. Diverticulosis of the sigmoid colon without diverticulitis. No bowel wall thickening or inflammatory change. Percutaneous  gastrostomy tube tip within the gastric lumen, with no evidence of complication. Vascular/Lymphatic: Stable IVC filter. Stable aortic atherosclerosis. No pathologic adenopathy within the abdomen or pelvis. Reproductive: Status post hysterectomy. No adnexal masses. Other: No free fluid or free intraperitoneal gas. Small fat containing hiatal hernia. Musculoskeletal: No acute or destructive bony abnormalities. Reconstructed images demonstrate no additional findings. IMPRESSION: 1. Trace bilateral pleural effusions with minimal dependent lower lobe atelectasis. 2. Cirrhosis. 3. Stable nonobstructing right renal calculi. 4. Cholelithiasis without cholecystitis. 5. Sigmoid diverticulosis without diverticulitis. 6. 6 mm mean diameter right lower lobe pulmonary nodule, stable for 3 months. Non-contrast chest CT at 3-9 months is recommended to document 1 year stability. If the nodule is stable at time of repeat CT, then future CT at 15-21 months (from today's scan) is considered optional for low-risk patients, but is recommended for high-risk patients. This recommendation follows the consensus statement: Guidelines for Management of Incidental Pulmonary Nodules Detected on CT Images: From the Fleischner Society 2017; Radiology 2017; 284:228-243. 7.  Aortic Atherosclerosis (ICD10-I70.0). Electronically Signed   By: Sharlet Salina M.D.   On: 02/27/2023 20:48   DG Chest Port 1 View  Result Date: 02/27/2023 CLINICAL DATA:  Possible sepsis.  Coarse crackles noted bilaterally. EXAM: PORTABLE CHEST 1 VIEW COMPARISON:  One-view chest x-ray 02/13/2023 FINDINGS: Patient is rotated to the right. The left IJ catheter was removed. A new right IJ dialysis catheter is in place. The distal tip is at the cavoatrial junction. The heart size is normal. Lung volumes are low. Left basilar airspace opacity is noted. No focal airspace disease is present in the right. No edema or effusion is present. The visualized soft tissues and bony  thorax are unremarkable. IMPRESSION: 1. Low lung volumes with left basilar airspace disease concerning for pneumonia. 2. New right IJ dialysis catheter without complicating features. Electronically Signed   By: Marin Roberts M.D.   On: 02/27/2023 17:52     Medications:    anticoagulant sodium citrate     norepinephrine (LEVOPHED) Adult infusion 10 mcg/min (03/01/23 0600)   piperacillin-tazobactam (ZOSYN)  IV     [START ON 03/03/2023] vancomycin      ascorbic acid  500 mg Per Tube BID   atorvastatin  20 mg Per Tube Daily   vitamin D3  2,000 Units Per Tube Daily   cholestyramine  4 g Per Tube TID   doxycycline  100 mg Per Tube Q12H   escitalopram  10 mg Per Tube Daily   famotidine  10 mg Per Tube Daily   feeding supplement (NEPRO CARB STEADY)  1,000 mL Per Tube Q24H   feeding supplement (PROSource TF20)  60 mL Per Tube Daily   fludrocortisone  0.2 mg Per Tube Daily   heparin  5,000 Units Subcutaneous Q8H   hydrocortisone sod succinate (SOLU-CORTEF) inj  50 mg Intravenous Q8H   hydrOXYzine  100 mg Per Tube BID   lipase/protease/amylase)  20,880 Units Per Tube Once   And   sodium bicarbonate  650 mg Per Tube  Once   metoCLOPramide (REGLAN) injection  10 mg Intravenous Q8H   midodrine  20 mg Per Tube TID WC   multivitamin  1 tablet Per Tube QHS   nystatin   Topical TID   sodium chloride flush  10-40 mL Intracatheter Q12H    Assessment/ Plan:     72 year old female with history of hypertension, coronary artery disease, congestive heart failure, COPD, cirrhosis, diabetes, status post AKA, status post G-tube placement.   Patient is admitted for: Dyspnea on exertion [R06.09] Dyspnea and respiratory abnormalities [R06.00, R06.89] Dyspnea, unspecified type [R06.00]  Outpatient dialysis: [facility]/TTS/[facility] [address]/right IJ PermCath  #1: End-stage renal disease:  We will continue hemodialysis TTS schedule.  During previous admission she had sepsis from unclear source.  PermCath was  removed and then replaced.  Overnight her dialysis was canceled due to hypotension.  We will arrange for next dialysis on Monday presuming that she is more stable.   #2:  Chronic hypotension:   Patient has history of chronic hypotension from adrenal insufficiency. Requires midodrine 20 mg 3 times per day. Currently requiring IV Levophed for hemodynamic support.   #3:  Sepsis due to recurrent  aspiration Currently on vancomycin, Zosyn. Requiring pressors-Levophed     LOS: 2 Willye Javier [facility] 11/17/20249:44 AM

## 2023-03-02 LAB — RENAL FUNCTION PANEL
Albumin: 2.3 g/dL — ABNORMAL LOW (ref 3.5–5.0)
Anion gap: 14 (ref 5–15)
BUN: 76 mg/dL — ABNORMAL HIGH (ref 8–23)
CO2: 20 mmol/L — ABNORMAL LOW (ref 22–32)
Calcium: 9.9 mg/dL (ref 8.9–10.3)
Chloride: 103 mmol/L (ref 98–111)
Creatinine, Ser: 5.57 mg/dL — ABNORMAL HIGH (ref 0.44–1.00)
GFR, Estimated: 8 mL/min — ABNORMAL LOW (ref >60)
Glucose, Bld: 152 mg/dL — ABNORMAL HIGH (ref 70–99)
Phosphorus: 3.7 mg/dL (ref 2.5–4.6)
Potassium: 4.5 mmol/L (ref 3.5–5.1)
Sodium: 137 mmol/L (ref 135–145)

## 2023-03-02 LAB — CBC
HCT: 34.8 % — ABNORMAL LOW (ref 36.0–46.0)
Hemoglobin: 11 g/dL — ABNORMAL LOW (ref 12.0–15.0)
MCH: 25.1 pg — ABNORMAL LOW (ref 26.0–34.0)
MCHC: 31.6 g/dL (ref 30.0–36.0)
MCV: 79.3 fL — ABNORMAL LOW (ref 80.0–100.0)
Platelets: 359 10*3/uL (ref 150–400)
RBC: 4.39 MIL/uL (ref 3.87–5.11)
RDW: 15.7 % — ABNORMAL HIGH (ref 11.5–15.5)
WBC: 28.6 10*3/uL — ABNORMAL HIGH (ref 4.0–10.5)
nRBC: 0 % (ref 0.0–0.2)

## 2023-03-02 LAB — MAGNESIUM: Magnesium: 2.5 mg/dL — ABNORMAL HIGH (ref 1.7–2.4)

## 2023-03-02 LAB — GLUCOSE, CAPILLARY
Glucose-Capillary: 140 mg/dL — ABNORMAL HIGH (ref 70–99)
Glucose-Capillary: 142 mg/dL — ABNORMAL HIGH (ref 70–99)
Glucose-Capillary: 111 mg/dL — ABNORMAL HIGH (ref 70–99)
Glucose-Capillary: 120 mg/dL — ABNORMAL HIGH (ref 70–99)
Glucose-Capillary: 104 mg/dL — ABNORMAL HIGH (ref 70–99)

## 2023-03-02 LAB — C-REACTIVE PROTEIN: CRP: 9.8 mg/dL — ABNORMAL HIGH (ref <1.0)

## 2023-03-02 MED ORDER — FREE WATER
30.0000 mL | Status: DC
  Administered 2023-03-02 – 2023-03-08 (×36): 30 mL

## 2023-03-02 MED ORDER — ZINC OXIDE 20 % EX OINT: TOPICAL_OINTMENT | CUTANEOUS | Status: DC | PRN

## 2023-03-02 MED ORDER — VANCOMYCIN HCL IN DEXTROSE 1-5 GM/200ML-% IV SOLN
1000.0000 mg | Freq: Once | INTRAVENOUS | Status: AC
  Administered 2023-03-02: 1000 mg via INTRAVENOUS
  Filled 2023-03-02: qty 200

## 2023-03-02 MED ORDER — INSULIN ASPART 100 UNIT/ML IJ SOLN
1.0000 [IU] | INTRAMUSCULAR | Status: DC
  Administered 2023-03-02: 1 [IU] via SUBCUTANEOUS
  Filled 2023-03-02: qty 1

## 2023-03-02 MED ORDER — JUVEN PO PACK
1.0000 | PACK | Freq: Two times a day (BID) | ORAL | Status: DC
  Administered 2023-03-02 – 2023-03-08 (×12): 1

## 2023-03-02 NOTE — Progress Notes (Signed)
[facility]  PROGRESS NOTE   Subjective:   Patient is known to our practice from previous admissions.  She was recently discharged on November 8.  She is brought back to the hospital by family due to concerns of coughing, difficulty breathing, concern of infection around the G-tube.      Update:  Patient did not have dialysis treatment yesterday. We are planning for hemodialysis treatment again today.  Objective:  Vital signs: Blood pressure (!) 107/58, pulse 65, temperature 97.7 F (36.5 C), temperature source Axillary, resp. rate 16, height 5\' 4"  (1.626 m), weight 85.5 kg, SpO2 94%.  Intake/Output Summary (Last 24 hours) at 03/02/2023 0839 Last data filed at 03/02/2023 0600 Gross per 24 hour  Intake 1410.96 ml  Output --  Net 1410.96 ml    Filed Weights   03/01/23 0045  Weight: 85.5 kg      Physical Exam: General: No acute distress  Head: Normocephalic, atraumatic. Moist oral mucosal membranes  Eyes: Anicteric  Lungs:  normal effort, coarse breath sounds  Heart: S1S2 no rubs  Abdomen:  Soft, nontender, bowel sounds present, G-tube in place  Extremities: 1+ lower extremity edema  Neurologic: Lethargic but arousable  Skin: No lesions  Access: Rt chest permcath/Dr. Dew/02/16/2023    Basic Metabolic Panel: Recent Labs  Lab 02/27/23 1449 02/28/23 0621 03/01/23 0255 03/02/23 0440 03/02/23 0441  NA 133* 134* 134* 137  --   K 3.3* 4.0 4.9 4.5  --   CL 100 100 101 103  --   CO2 24 21* 20* 20*  --   GLUCOSE 89 137* 158* 152*  --   BUN 27* 33* 49* 76*  --   CREATININE 2.30* 3.07* 4.48* 5.57*  --   CALCIUM 9.2 9.4 9.3 9.9  --   MG  --   --   --   --  2.5*  PHOS  --   --   --  3.7  --    GFR: Estimated Creatinine Clearance: 9.4 mL/min (A) (by C-G formula based on SCr of 5.57 mg/dL (H)).  Liver Function Tests: Recent Labs  Lab 02/27/23 1449 03/01/23 0255 03/02/23 0440  AST 59* 91*  --   ALT 74* 92*  --   ALKPHOS 157* 102  --   BILITOT 0.5  0.9  --   PROT 7.3 6.4*  --   ALBUMIN 3.0* 2.5* 2.3*   No results for input(s): "LIPASE", "AMYLASE" in the last 168 hours. No results for input(s): "AMMONIA" in the last 168 hours.   CBC: Recent Labs  Lab 02/27/23 1449 02/28/23 0621 03/01/23 0255 03/02/23 0441  WBC 15.7* 32.3* 42.8* 28.6*  NEUTROABS 8.8*  --   --   --   HGB 12.4 14.1 13.0 11.0*  HCT 39.8 45.9 41.6 34.8*  MCV 82.4 83.6 81.4 79.3*  PLT 388 367 413* 359     HbA1C: Hgb A1c MFr Bld  Date/Time Value Ref Range Status  02/11/2023 02:55 PM 4.7 <5.7 % of total Hgb Final    Comment:    For the purpose of screening for the presence of diabetes: . <5.7%       Consistent with the absence of diabetes 5.7-6.4%    Consistent with increased risk for diabetes             (prediabetes) > or =6.5%  Consistent with diabetes . This assay result is consistent with a decreased risk of diabetes. . Currently, no consensus exists regarding use of hemoglobin A1c  for diagnosis of diabetes in children. . According to American Diabetes Association (ADA) guidelines, hemoglobin A1c <7.0% represents optimal control in non-pregnant diabetic patients. Different metrics may apply to specific patient populations.  Standards of Medical Care in Diabetes(ADA). [name]   12/07/2022 02:13 AM 5.2 4.8 - 5.6 % Final    Comment:    (NOTE) Pre diabetes:          5.7%-6.4%  Diabetes:              >6.4%  Glycemic control for   <7.0% adults with diabetes     Urinalysis: No results for input(s): "COLORURINE", "LABSPEC", "PHURINE", "GLUCOSEU", "HGBUR", "BILIRUBINUR", "KETONESUR", "PROTEINUR", "UROBILINOGEN", "NITRITE", "LEUKOCYTESUR" in the last 72 hours.  Invalid input(s): "APPERANCEUR"     Imaging: US Abdomen Limited RUQ (LIVER/GB)  Result Date: 03/01/2023 CLINICAL DATA:  Elevated liver function tests EXAM: ULTRASOUND ABDOMEN LIMITED RIGHT UPPER QUADRANT COMPARISON:  02/27/2023 FINDINGS: Gallbladder: Shadowing gallstone layers  dependently within the gallbladder, measuring 19 mm. No gallbladder wall thickening or pericholecystic fluid. Negative sonographic Murphy sign. Common bile duct: Diameter: 3 mm Liver: Coarsened liver echotexture with nodularity of the liver capsule consistent with cirrhosis. No focal parenchymal abnormality or intrahepatic duct dilation. Portal vein is patent on color Doppler imaging with normal direction of blood flow towards the liver. Other: None. IMPRESSION: 1. Cholelithiasis without evidence of acute cholecystitis. 2. Cirrhosis. Electronically Signed   By: Sharlet Salina M.D.   On: 03/01/2023 11:26   DG Chest Port 1 View  Result Date: 03/01/2023 CLINICAL DATA:  Central line placement EXAM: PORTABLE CHEST 1 VIEW COMPARISON:  02/27/2023 FINDINGS: Left internal jugular central line placement with the tip in the SVC. Right dialysis catheter is unchanged. No pneumothorax. Heart and mediastinal contours are within normal limits. No confluent opacities, effusions or overt edema. No acute bony abnormality. IMPRESSION: Left central line tip in the SVC.  No pneumothorax. Electronically Signed   By: Charlett Nose M.D.   On: 03/01/2023 01:19   DG Abd 1 View  Result Date: 02/28/2023 CLINICAL DATA:  Abdominal pain with leaking gastrostomy tube EXAM: ABDOMEN - 1 VIEW COMPARISON:  Abdominal radiograph dated 12/31/2022 FINDINGS: Patient is rotated to the right. Percutaneous gastrostomy tube projects over the right upper quadrant. Nonspecific bowel gas pattern. Large volume stool within the ascending colon. No free air or pneumatosis. No abnormal radio-opaque calculi or mass effect. No acute or substantial osseous abnormality. The sacrum and coccyx are partially obscured by overlying bowel contents. IVC filter in-situ. Partially imaged central venous catheter tip projects over the superior cavoatrial junction. IMPRESSION: 1. Nonspecific bowel gas pattern. Large volume stool within the ascending colon. 2. Patient is  rotated to the right. Percutaneous gastrostomy tube projects over the right upper quadrant. Electronically Signed   By: Agustin Cree M.D.   On: 02/28/2023 13:12     Medications:    anticoagulant sodium citrate     norepinephrine (LEVOPHED) Adult infusion Stopped (03/02/23 0415)   piperacillin-tazobactam (ZOSYN)  IV 2.25 g (03/02/23 0829)   [START ON 03/03/2023] vancomycin      ascorbic acid  500 mg Per Tube BID   atorvastatin  20 mg Per Tube Daily   vitamin D3  2,000 Units Per Tube Daily   cholestyramine  4 g Per Tube TID   escitalopram  10 mg Per Tube Daily   famotidine  10 mg Per Tube Daily   feeding supplement (NEPRO CARB STEADY)  1,000 mL Per Tube Q24H   feeding supplement (PROSource  TF20)  60 mL Per Tube Daily   fludrocortisone  0.2 mg Per Tube Daily   heparin  5,000 Units Subcutaneous Q8H   hydrocortisone sod succinate (SOLU-CORTEF) inj  50 mg Intravenous Q8H   hydrOXYzine  100 mg Per Tube BID   insulin aspart  1-3 Units Subcutaneous Q4H   lipase/protease/amylase)  20,880 Units Per Tube Once   And   sodium bicarbonate  650 mg Per Tube Once   metoCLOPramide (REGLAN) injection  10 mg Intravenous Q8H   midodrine  20 mg Per Tube TID WC   multivitamin  1 tablet Per Tube QHS   nystatin   Topical TID   sodium chloride flush  10-40 mL Intracatheter Q12H    Assessment/ Plan:     72 year old female with history of hypertension, coronary artery disease, congestive heart failure, COPD, cirrhosis, diabetes, status post AKA, status post G-tube placement.   Patient is admitted for: Dyspnea on exertion [R06.09] Dyspnea and respiratory abnormalities [R06.00, R06.89] Dyspnea, unspecified type [R06.00]  Outpatient dialysis: [facility]S/[facility] [address]/right IJ PermCath  #1: End-stage renal disease:  Patient did not have dialysis treatment on Saturday.  Therefore we will plan for hemodialysis treatment today.  Orders have been prepared.   #2:  Chronic hypotension:   Patient has history of  chronic hypotension from adrenal insufficiency. Requires midodrine 20 mg 3 times per day. Levophed now stopped.   #3:  Sepsis due to recurrent  aspiration Currently off pressors.  Continue Comycin and Zosyn.  #4: Anemia of chronic kidney.  Hemoglobin currently down to 11.  Old off on Epogen at this time.     LOS: 3 Raeley Gilmore [facility] 11/18/20248:39 AM

## 2023-03-02 NOTE — Progress Notes (Signed)
NAME:  Alexandra Foster, MRN:  [id number], DOB:  [birth year], LOS: 3 ADMISSION DATE:  02/27/2023, CONSULTATION DATE:  03/01/2023 REFERRING MD:  Manuela Schwartz REASON FOR CONSULT:  Hypotension   HPI  72 y.o with significant PMH of Asthma, COPD, type diabetes mellitus, s/p G-tube, GERD, ESRD on HD(TuThSa), hypertension, osteoarthritis, paroxysmal atrial fibrillation and OSA on CPAP who was sent to the ED from her PCP's office for evaluation of possible sepsis.  Patient has had multiple admission this year with different medical issues most recently on 8/23/24with Acute Metabolic Encephalopathy in setting of Severe Sepsis with Septic Shock due to MRSA Bacteremia from infected right chest Permcath, along with Enterococcus Faecalis UTI and TIA.   ED Course: Initial vital signs showed HR of 64 beats/minute, BP 108/82 mm Hg, the RR 23 breaths/minute, and the oxygen saturation 99% on RA and a temperature of 98.53F (37.0C).  Pertinent Labs/Diagnostics Findings: Na+/ K+: 133/3.3 Glucose: 89 BUN/Cr.: 27/2.30  AST/ALT:59/54 WBC: 15.7  PCT: 0.22  Lactic acid: 1.2 CXR> CTA Chest Abd/pelvis>see results Medication administered in the KV:[id number] Disposition:Hospitalist consulted for admission to medsurg unit. Patient was started on broad spectrum abx for suspected sepsis of unknown source.  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 PCCM consulted 03/02/23- patient continues to shows signs of discomfort, she has poor prognosis with multiorgan failure.  Patients family is coming in  today to discuss goals of care.   Consults:  Nephrology PCCM  Procedures:  11/17: Left internal jugular Central Line   Significant Diagnostic Tests:  11/17: Chest Xray> FINDINGS: Left internal jugular central line placement with the tip in the SVC. Right dialysis catheter is unchanged. No pneumothorax. Heart and mediastinal contours are within normal limits. No confluent opacities, effusions or overt edema. No acute bony abnormality.  11/15: Chest Xray> IMPRESSION: 1. Low lung volumes with left basilar airspace disease concerning for pneumonia. 2. New right IJ dialysis catheter without complicating features.  11/15: CTA Chest, abdomen and pelvis> IMPRESSION: 1. Trace bilateral pleural effusions with minimal dependent lower lobe atelectasis. 2. Cirrhosis. 3. Stable nonobstructing right renal calculi. 4. Cholelithiasis without cholecystitis. 5. Sigmoid diverticulosis without diverticulitis. 6. 6 mm mean diameter right lower lobe pulmonary nodule, stable for 3 months. Non-contrast chest CT at 3-9 months is recommended to document 1 year stability. If the nodule is stable at time of repeat CT, then future CT at 15-21 months (from today's scan) is considered optional for low-risk patients, but is recommended for high-risk patients. This recommendation follows the consensus statement: Guidelines for Management of Incidental Pulmonary Nodules Detected on CT Images: From the Fleischner Society 2017; Radiology 2017; 284:228-243. 7.  Aortic Atherosclerosis (ICD10-I70.0).  Interim History / Subjective:      Micro Data:  11/15: Blood culture x2>No growth  Antimicrobials:  11/16 Unasyn >>  11/16 Vancomycin  OBJECTIVE  Blood pressure 115/61, pulse 65, temperature 98.9 F (37.2 C), temperature source Oral, resp. rate 14, height 5\' 4"  (1.626 m), weight 85.5 kg, SpO2 95%.      Intake/Output Summary (Last 24 hours) at 03/02/2023 0846 Last data filed at 03/02/2023 0600 Gross  per 24 hour  Intake 1410.96 ml  Output --  Net 1410.96 ml   Filed Weights   03/01/23 0045  Weight: 85.5 kg    Physical Examination  GENERAL:  year-old critically ill patient lying in the bed  EYES: PEERLA. No scleral icterus. Extraocular muscles intact.  HEENT: Head atraumatic, normocephalic. Oropharynx and nasopharynx clear.  NECK:  No JVD, supple  LUNGS: Decreased breath sounds bilaterally.  No use of accessory muscles of respiration.  CARDIOVASCULAR: S1, S2 normal. No murmurs, rubs, or gallops.  ABDOMEN: Soft, NTND EXTREMITIES: LBKA. Capillary refill < 3 seconds in all extremities. Pulses palpable distally. NEUROLOGIC: The patient is . No focal neurological deficit appreciated. Cranial nerves are intact.  SKIN:          Labs/imaging that I havepersonally reviewed  (right click and "Reselect all SmartList Selections" daily)     Labs   CBC: Recent Labs  Lab 02/27/23 1449 02/28/23 0621 03/01/23 0255 03/02/23 0441  WBC 15.7* 32.3* 42.8* 28.6*  NEUTROABS 8.8*  --   --   --   HGB 12.4 14.1 13.0 11.0*  HCT 39.8 45.9 41.6 34.8*  MCV 82.4 83.6 81.4 79.3*  PLT 388 367 413* 359    Basic Metabolic Panel: Recent Labs  Lab 02/27/23 1449 02/28/23 0621 03/01/23 0255 03/02/23 0440 03/02/23 0441  NA 133* 134* 134* 137  --   K 3.3* 4.0 4.9 4.5  --   CL 100 100 101 103  --   CO2 24 21* 20* 20*  --   GLUCOSE 89 137* 158* 152*  --   BUN 27* 33* 49* 76*  --   CREATININE 2.30* 3.07* 4.48* 5.57*  --   CALCIUM 9.2 9.4 9.3 9.9  --   MG  --   --   --   --  2.5*  PHOS  --   --   --  3.7  --    GFR: Estimated Creatinine Clearance: 9.4 mL/min (A) (by C-G formula based on SCr of 5.57 mg/dL (H)). Recent Labs  Lab 02/27/23 1449 02/27/23 1453 02/28/23 0621 03/01/23 0255 03/02/23 0441  PROCALCITON  --  0.22 13.10  --   --   WBC 15.7*  --  32.3* 42.8* 28.6*  LATICACIDVEN 1.8  --   --  2.7*  --     Liver Function Tests: Recent Labs  Lab 02/27/23 1449 03/01/23 0255  03/02/23 0440  AST 59* 91*  --   ALT 74* 92*  --   ALKPHOS 157* 102  --   BILITOT 0.5 0.9  --   PROT 7.3 6.4*  --   ALBUMIN 3.0* 2.5* 2.3*   No results for input(s): "LIPASE", "AMYLASE" in the last 168 hours. No results for input(s): "AMMONIA" in the last 168 hours.  ABG    Component Value Date/Time   PHART 7.40 05/14/2021 0341   PCO2ART 45 05/14/2021 0341   PO2ART 129 (H) 05/14/2021 0341   HCO3 21.6 02/14/2023 0937   ACIDBASEDEF 4.3 (H) 02/14/2023 0937   O2SAT 87 02/14/2023 0937     Coagulation Profile: Recent Labs  Lab 02/27/23 1449  INR 1.0    Cardiac Enzymes: No results for input(s): "CKTOTAL", "CKMB", "CKMBINDEX", "TROPONINI" in the last 168 hours.  HbA1C: Hgb A1c MFr Bld  Date/Time Value Ref Range Status  02/11/2023 02:55 PM 4.7 <5.7 % of total Hgb Final    Comment:    For the purpose of screening for the presence of diabetes: . <5.7%  Consistent with the absence of diabetes 5.7-6.4%    Consistent with increased risk for diabetes             (prediabetes) > or =6.5%  Consistent with diabetes . This assay result is consistent with a decreased risk of diabetes. . Currently, no consensus exists regarding use of hemoglobin A1c for diagnosis of diabetes in children. . According to American Diabetes Association (ADA) guidelines, hemoglobin A1c <7.0% represents optimal control in non-pregnant diabetic patients. Different metrics may apply to specific patient populations.  Standards of Medical Care in Diabetes(ADA). [name]   12/07/2022 02:13 AM 5.2 4.8 - 5.6 % Final    Comment:    (NOTE) Pre diabetes:          5.7%-6.4%  Diabetes:              >6.4%  Glycemic control for   <7.0% adults with diabetes     CBG: Recent Labs  Lab 03/01/23 1615 03/01/23 1938 03/01/23 2319 03/02/23 0347 03/02/23 0722  GLUCAP 177* 155* 154* 140* 142*    Review of Systems:   Unable to be obtained secondary to the patient's altered mental status.    Past  Medical History  She,  has a past medical history of AKI (acute kidney injury) (HCC), Arthritis, Asthma, Bacteremia, COPD (chronic obstructive pulmonary disease) (HCC), Diabetes mellitus without complication (HCC), Endometriosis, GERD (gastroesophageal reflux disease), History of echocardiogram, Hypertension, Obesity, PAF (paroxysmal atrial fibrillation) (HCC), and Sleep apnea.   Surgical History    Past Surgical History:  Procedure Laterality Date   ABDOMINAL HYSTERECTOMY     APPLICATION OF WOUND VAC Left 05/17/2021   Procedure: APPLICATION OF WOUND VAC/WOUND VAC EXCHANGE-Matrix Myriad;  Surgeon: Carolan Shiver, MD;  Location: [facility];  Service: General;  Laterality: Left;   APPLICATION OF WOUND VAC  05/24/2021   Procedure: APPLICATION OF WOUND VAC;  Surgeon: Carolan Shiver, MD;  Location: [facility];  Service: General;;   APPLICATION OF WOUND VAC  05/10/2021   Procedure: APPLICATION OF WOUND VAC;  Surgeon: Carolan Shiver, MD;  Location: [facility];  Service: General;;   COLONOSCOPY  10/29/2006   Dr Servando Snare   COLONOSCOPY WITH PROPOFOL N/A 11/05/2016   Procedure: COLONOSCOPY WITH PROPOFOL;  Surgeon: Earline Mayotte, MD;  Location: [facility];  Service: Endoscopy;  Laterality: N/A;   DIALYSIS/PERMA CATHETER INSERTION N/A 12/19/2022   Procedure: DIALYSIS/PERMA CATHETER INSERTION;  Surgeon: Annice Needy, MD;  Location: [facility];  Service: Cardiovascular;  Laterality: N/A;   DIALYSIS/PERMA CATHETER INSERTION N/A 02/04/2023   Procedure: DIALYSIS/PERMA CATHETER INSERTION;  Surgeon: Annice Needy, MD;  Location: [facility];  Service: Cardiovascular;  Laterality: N/A;   DIALYSIS/PERMA CATHETER INSERTION N/A 02/16/2023   Procedure: DIALYSIS/PERMA CATHETER INSERTION;  Surgeon: Annice Needy, MD;  Location: [facility];  Service: Cardiovascular;  Laterality: N/A;   INCISION AND DRAINAGE OF WOUND Left 05/24/2021   Procedure: IRRIGATION AND DEBRIDEMENT LEFT LEG;   Surgeon: Carolan Shiver, MD;  Location: [facility];  Service: General;  Laterality: Left;   INCISION AND DRAINAGE OF WOUND Left 05/10/2021   Procedure: IRRIGATION AND DEBRIDEMENT LEFT LEG;  Surgeon: Carolan Shiver, MD;  Location: [facility];  Service: General;  Laterality: Left;   IR FLUORO GUIDE CV LINE RIGHT  06/12/2021   IR PERC TUN PERIT CATH WO PORT S&I /IMAG  06/12/2021   IR REPLC GASTRO/COLONIC TUBE PERCUT W/FLUORO  06/12/2021   IVC FILTER INSERTION N/A 05/29/2021   Procedure: IVC  FILTER INSERTION;  Surgeon: Renford Dills, MD;  Location: [facility];  Service: Cardiovascular;  Laterality: N/A;   MINOR GRAFT APPLICATION  05/24/2021   Procedure: Myriad Matrix  APPLICATION;  Surgeon: Carolan Shiver, MD;  Location: [facility];  Service: General;;   NASAL SINUS SURGERY  2002   Dr Chestine Spore   PEG PLACEMENT N/A 05/28/2021   Procedure: PERCUTANEOUS ENDOSCOPIC GASTROSTOMY (PEG) PLACEMENT;  Surgeon: Sung Amabile, DO;  Location: [facility];  Service: General;  Laterality: N/A;  TRAVEL CASE   TRACHEOSTOMY TUBE PLACEMENT N/A 05/17/2021   Procedure: TRACHEOSTOMY;  Surgeon: Geanie Logan, MD;  Location: [facility];  Service: ENT;  Laterality: N/A;   WOUND DEBRIDEMENT Left 05/07/2021   Procedure: DEBRIDEMENT WOUND;  Surgeon: Carolan Shiver, MD;  Location: [facility];  Service: General;  Laterality: Left;     Social History   reports that she quit smoking about 21 years ago. Her smoking use included cigarettes. She started smoking about 61 years ago. She has a 80 pack-year smoking history. She quit smokeless tobacco use about 21 years ago.  Her smokeless tobacco use included snuff. She reports that she does not drink alcohol and does not use drugs.   Family History   Her family history includes Alcohol abuse in her brother; Breast cancer (age of onset: 19) in her sister; Congestive Heart Failure in her mother; Coronary artery disease (age of onset: 55) in her father; Heart disease in  her brother; Varicose Veins in her brother.   Allergies Allergies  Allergen Reactions   Chlorhexidine Dermatitis     Home Medications  Prior to Admission medications   Medication Sig Start Date End Date Taking? Authorizing Provider  ascorbic acid (VITAMIN C) 500 MG tablet Place 1 tablet (500 mg total) into feeding tube 2 (two) times daily. 06/13/21  Yes Erin Fulling, MD  atorvastatin (LIPITOR) 20 MG tablet Place 1 tablet (20 mg total) into feeding tube daily. 02/12/23  Yes Sowles, [name], MD  cholestyramine Lanetta Inch) 4 g packet Place 1 packet (4 g total) into feeding tube 3 (three) times daily. 02/11/23  Yes Sowles, [name], MD  clonazePAM (KLONOPIN) 0.5 MG tablet Take 0.5-1 tablets (0.25-0.5 mg total) by mouth daily as needed for anxiety. 02/11/23  Yes Sowles, [name], MD  escitalopram (LEXAPRO) 10 MG tablet Place 1 tablet (10 mg total) into feeding tube daily. 02/11/23  Yes Sowles, [name], MD  famotidine (PEPCID) 10 MG tablet Place 1 tablet (10 mg total) into feeding tube daily. 02/11/23  Yes Sowles, [name], MD  fludrocortisone (FLORINEF) 0.1 MG tablet Place 2 tablets (0.2 mg total) into feeding tube daily. 12/30/22  Yes Wieting, Richard, MD  hydrOXYzine (ATARAX) 50 MG tablet Take 2 tablets (100 mg total) by mouth 2 (two) times daily. Home med. 02/20/23  Yes [name], MD  metoCLOPramide (REGLAN) 5 MG tablet Take 1 tablet (5 mg total) by mouth 3 (three) times daily before meals. 02/11/23  Yes Sowles, [name], MD  midodrine (PROAMATINE) 10 MG tablet Place 2 tablets (20 mg total) into feeding tube 3 (three) times daily with meals. 02/12/23  Yes Sowles, [name], MD  multivitamin (RENA-VIT) TABS tablet Place 1 tablet into feeding tube at bedtime. 02/04/23  Yes Sowles, [name], MD  traZODone (DESYREL) 50 MG tablet Take 0.5 tablets (25 mg total) by mouth at bedtime as needed for sleep. 02/11/23  Yes Sowles, [name], MD  vitamin D3 (CHOLECALCIFEROL) 25 MCG tablet Place 2 tablets (2,000 Units total)  into feeding tube daily. 01/02/23  Yes  Enedina Finner, MD  acetaminophen (TYLENOL) 325 MG tablet Place 2 tablets (650 mg total) into feeding tube every 6 (six) hours as needed for mild pain (or Fever >/= 101). 12/29/22   Alford Highland, MD  amiodarone (PACERONE) 200 MG tablet Hold due to low heart rate, pending outpatient cardiology followup. Patient not taking: Reported on 02/23/2023 02/20/23   [name], MD  diphenoxylate-atropine (LOMOTIL) 2.5-0.025 MG tablet 1 tablet by Per J Tube route 4 (four) times daily as needed for diarrhea or loose stools. Patient not taking: Reported on 02/23/2023 12/29/22   Alford Highland, MD  glucose blood (ACCU-CHEK GUIDE) test strip Use as instructed 02/23/23   [name], MD  liver oil-zinc oxide (DESITIN) 40 % ointment Apply topically 3 (three) times daily as needed for irritation. Apply to skin around G tube 3 times daily as needed for skin irritation 02/20/23   [name], MD  Nutritional Supplements (FEEDING SUPPLEMENT, NEPRO CARB STEADY,) LIQD Place 1,120 mLs into feeding tube daily. 12/29/22   Alford Highland, MD  Nutritional Supplements (FEEDING SUPPLEMENT, NEPRO CARB STEADY,) LIQD Place 1,120 mLs into feeding tube daily. 01/02/23   Enedina Finner, MD  nystatin (MYCOSTATIN/NYSTOP) powder Apply topically 3 (three) times daily. 12/29/22   Alford Highland, MD  polyvinyl alcohol (LIQUIFILM TEARS) 1.4 % ophthalmic solution Place 1 drop into both eyes as needed for dry eyes. Patient not taking: Reported on 02/23/2023 12/29/22   Alford Highland, MD  Water For Irrigation, Sterile (FREE WATER) SOLN Place 50 mLs into feeding tube every 4 (four) hours. 12/29/22   Alford Highland, MD  Scheduled Meds:  ascorbic acid  500 mg Per Tube BID   atorvastatin  20 mg Per Tube Daily   vitamin D3  2,000 Units Per Tube Daily   cholestyramine  4 g Per Tube TID   escitalopram  10 mg Per Tube Daily   famotidine  10 mg Per Tube Daily   feeding supplement (NEPRO CARB STEADY)  1,000 mL Per  Tube Q24H   feeding supplement (PROSource TF20)  60 mL Per Tube Daily   fludrocortisone  0.2 mg Per Tube Daily   heparin  5,000 Units Subcutaneous Q8H   hydrocortisone sod succinate (SOLU-CORTEF) inj  50 mg Intravenous Q8H   hydrOXYzine  100 mg Per Tube BID   insulin aspart  1-3 Units Subcutaneous Q4H   lipase/protease/amylase)  20,880 Units Per Tube Once   And   sodium bicarbonate  650 mg Per Tube Once   metoCLOPramide (REGLAN) injection  10 mg Intravenous Q8H   midodrine  20 mg Per Tube TID WC   multivitamin  1 tablet Per Tube QHS   nystatin   Topical TID   sodium chloride flush  10-40 mL Intracatheter Q12H   Continuous Infusions:  anticoagulant sodium citrate     norepinephrine (LEVOPHED) Adult infusion Stopped (03/02/23 0415)   piperacillin-tazobactam (ZOSYN)  IV 2.25 g (03/02/23 0829)   [START ON 03/03/2023] vancomycin     PRN Meds:.acetaminophen **OR** acetaminophen, alteplase, anticoagulant sodium citrate, bisacodyl, clonazePAM, heparin, heparin, ipratropium-albuterol, lidocaine (PF), lidocaine-prilocaine, liver oil-zinc oxide, LORazepam, ondansetron **OR** ondansetron (ZOFRAN) IV, pentafluoroprop-tetrafluoroeth, senna-docusate, sodium chloride flush, traZODone  Active Hospital Problem list   See systems below  Assessment & Plan:     1#Septic Shock -  PRESENT ON ADMISSION - DUE TO PNEUMONIA AND INFECTED CHRONIC SOFT TISSUE INFECTION         -Continue antibiotics         -infectious disease consultation - patient with recurrent  infections -          -cardiology consultation - patient had MRSA bacteremia in the  Recent MRSA BACTEREMIA due to infected Permcath & Enterococcus Faecalis UTI Permcath was removed on 8/24 -Trend Lactate, procal, WBC and monitor fever curve  -continue vancomycin pending cultures & sensitivities  will switch to Zosyn for broader coverage -ID previously recommended TEE ~ pts family declined due to concerns of pt receiving sedation~ ID previously  recommended 6 weeks of Vancomycin if unable to undergo TEE  2# - 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 (IVC filter placed 05/29/21) PMHx: Paroxsymal Atrial Fibrillation, HTN, HLD Echocardiogram 02/15/2023: LVEF 60-65%, RV systolic function low normal, RV size mildly enlarged, trivial mitral regurgitation -Check Lactate -Vasopressors as needed to maintain MAP goal -continue Midodrine to 20 mg TID, -start stress dose steroids -Continue Atorvastatin -Hold outpatient amiodarone     #3ESRD on Hemodialysis TTHS - nephrolithiasis #4 cholelithiasis - chronic - monitor for possible pancreatitis vs acute cholecystitis #5 OSA on CPAP Supplemental O2 as needed to maintain O2 sats 88 to 92% -CPAP qhs -Follow intermittent CXR & ABG as needed -Bronchodilators prn -Pulmonary toilet as able -ABX as above # Hyponatremia #Hypokalemia  Unable to complete HD 02/28/23 due to hypotension -Trend BMP  -Ensure adequate renal perfusion -Replace electrolytes as indicated ~ Pharmacy following for assistance with electrolyte replacement -Nephrology following, appreciate input: HD per recommendations   #COPD without acute exacerbation -continue current nebulizer and antimicrobials    #Anemia of chronic kidney disease -Trend CBC -Monitor for s/sx of bleeding  -VTE prophylaxis: subcutaneous heparin  -Transfuse for Hgb <7   #Hx of Adrenal Insufficiency -Repeat Cortisol is 51.0  (previously 35.9) ~ continue Florinef -Check TSH and free T4    #Severe irritant dermatitis and Skin tears at abdominal folds and buttocks  - Turn q2hrs  - Wound care consult    #Type 2 diabetes mellitus - CBG's q4h; Target range of 140 to 180 - SSI - Follow ICU Hypo/Hyperglycemia protocol - Hold outpatient metformin  #Anxiety and depression -Resume home Lexapro 10 mg daily, hydroxyzine 100 mg twice daily, clonazepam 0.5 mg daily as needed   Best practice:  Diet:  Tube Feed  Pain/Anxiety/Delirium protocol (if  indicated): No VAP protocol (if indicated): Not indicated DVT prophylaxis: Subcutaneous Heparin GI prophylaxis: H2B Glucose control:  SSI Yes Central venous access:  Yes, and it is still needed Arterial line:  N/A Foley:  N/A Mobility:  bed rest  PT consulted: N/A Last date of multidisciplinary goals of care discussion [11/17] Code Status:  full code Disposition: ICU   = Goals of Care = Code Status Order: FULL  Primary Emergency Contact: Rushlow,[name], Home Phone: [phone] Wishes to pursue full aggressive treatment and intervention options, including CPR and intubation, but goals of care will be addressed on going with family if that should become necessary.   Critical care provider statement:   Total critical care time: 33 minutes   Performed by: Karna Christmas MD   Critical care time was exclusive of separately billable procedures and treating other patients.   Critical care was necessary to treat or prevent imminent or life-threatening deterioration.   Critical care was time spent personally by me on the following activities: development of treatment plan with patient and/or surrogate as well as nursing, discussions with consultants, evaluation of patient's response to treatment, examination of patient, obtaining history from patient or surrogate, ordering and performing treatments and interventions, ordering and review of laboratory studies, ordering and  review of radiographic studies, pulse oximetry and re-evaluation of patient's condition.    Vida Rigger, M.D.  Pulmonary & Critical Care Medicine

## 2023-03-02 NOTE — Progress Notes (Signed)
Nutrition Follow Up Note   DOCUMENTATION CODES:   Obesity unspecified  INTERVENTION:   Nepro @45ml /hr continuous + ProSource TF 20- Give 60ml daily via tube  Free water flushes 30ml q4 hours to maintain tube patency   Regimen provides 2024kcal/day, 107g/day protein and 95ml/day of free water.   Rena-vit daily via tube   Juven Fruit Punch BID via tube, each serving provides 95kcal and 2.5g of protein (amino acids glutamine and arginine)  Daily weights   NUTRITION DIAGNOSIS:   Inadequate oral intake related to dysphagia as evidenced by NPO status, other (comment) (pt with chronic G-tube). -ongoing   GOAL:   Patient will meet greater than or equal to 90% of their needs -met   MONITOR:   Labs, Weight trends, TF tolerance, Skin, I & O's  ASSESSMENT:   72 y.o. female with a history of asthma/COPD, HTN, DMII, obesity, OSA on CPAP, SDH, GERD, ESRD on HD, afib, anxiety, depression, possible cirrhosis and a lengthy admission (04/2021) for necrotizing fasciitis, bacteremia and sepsis (requiring L AKA, tracheostomy and G-tube placement) and recent admissions for MRSA & Staph epi bacteremia, UTI, toxic metabolic encephalopathy and septic shock who is admitted with recurrent sepsis.  Met with pt in room today. Pt reports that she is feeling horrible today. Tube feeds started over the weekend and pt is tolerating well at goal rate. Pt's home regimen is Nepro boluses, four cartons daily via tube along with 30ml flushes before and after each feed (provides 1680kcal/day, 76g/day protein and 939ml/day of free water). Pt receiving continuous tube feeds in hospital r/t her high aspiration risk. Pt does have a h/o suspected gastroparesis and does vomit intermittently at baseline. Pt does not take anything by mouth. Per chart, pt appears weight stable pta. Pt with h/o copper deficiency which was treated last admission and returned normal. Plan is for HD today.   Medications reviewed and include:  vitamin C, D3, pepcid, heparin, solu-cortef, insulin, reglan, rena-vit, zosyn  Labs reviewed: K 4.5 wnl, BUN 76(H), creat 5.57(H), P 3.7 wnl, Mg 2.5(H) Copper- 84 wnl- 11/5 Wbc- 28.6(H)  Cbgs- 111, 142, 140 x 24 hrs  AIC 4.7- 01/2023  NUTRITION - FOCUSED PHYSICAL EXAM:  Flowsheet Row Most Recent Value  Orbital Region No depletion  Upper Arm Region Mild depletion  Thoracic and Lumbar Region No depletion  Buccal Region No depletion  Temple Region No depletion  Clavicle Bone Region No depletion  Clavicle and Acromion Bone Region No depletion  Scapular Bone Region No depletion  Dorsal Hand No depletion  Patellar Region No depletion  Anterior Thigh Region No depletion  Posterior Calf Region No depletion  Edema (RD Assessment) Mild  Hair Reviewed  Eyes Reviewed  Mouth Reviewed  Skin Reviewed  Nails Reviewed   Diet Order:   Diet Order             Diet NPO time specified  Diet effective now                  EDUCATION NEEDS:   No education needs have been identified at this time  Skin:  Skin Assessment: Reviewed RN Assessment (superficial skin loss over buttocks and thighs)  Last BM:  11/18- type 6  Height:   Ht Readings from Last 1 Encounters:  03/01/23 5\' 4"  (1.626 m)    Weight:   Wt Readings from Last 1 Encounters:  03/01/23 85.5 kg    Ideal Body Weight:  54.5 kg  BMI:  Body mass index is  32.35 kg/m.  Estimated Nutritional Needs:   Kcal:  1800-2000kcal/day  Protein:  90-105g/day  Fluid:  UOP +1L  Betsey Holiday MS, RD, LDN Please refer to [facility] for RD and/or RD on-call/weekend/after hours pager

## 2023-03-02 NOTE — IPAL (Signed)
GOALS OF CARE FAMILY CONFERENCE   Current clinical status, hospital findings and medical plan was reviewed with family.   Updated and notified of patients ongoing immediate critical medical problems.   Patient remains poorly responsive   Patient has severe and multiple active comorbid conditions portending a very poor prognosis including - ESRD, OSA, CHF with AF, DM, Septic shock, pneumonia, multiple soft skin ulcers/skin wounds, left Above knee amputation, COPD, adrenal insufficiency, nephrolithiais, cholelithiasis, electrolyte dysfunction  Explained to family course of therapy and the modalities   Patient with Progressive multiorgan failure with high probability of a very minimal chance of meaningful recovery despite aggressive and optimal medical therapy.   Family is appreciative of care and relate understanding that patient is severely critically ill with anticipation of passing away during this hospitalization.   They have consented and agreed to [facility]  Code status but DO WANT CPR and chest compressions   Family are satisfied with Plan of action and management. All questions answered  Additional Critical Care time 35 mins    Vida Rigger, M.D.  Pulmonary & Critical Care Medicine  [facility]

## 2023-03-02 NOTE — Consult Note (Signed)
WOC Nurse Consult Note: Reason for Consult:pressure ulcers However noted skin loss due to moisture and possible reaction to CHG or other irritant  Wound type: superficial skin loss over buttocks and thighs  Pressure Injury POA: NA Measurement:NA Wound JYN:WGNFAOZ thickness skin loss; pink base Drainage (amount, consistency, odor) serous Periwound: intact  Dressing procedure/placement/frequency: Apply single layer of xeroform to the weeping, sloughing skin, top with ABD pads, ok to secure with mesh underwear or use foam.  If areas are weeping too much for foam, DC xeroform and foam and use barrier ointment only.  Low air loss mattress in place will help with moisture management and pressure redistribution.  Will request one to be ordered if patient transfers from the ICU   Re consult if needed, will not follow at this time. Thanks  Alexandria Shiflett [employer], RN,CWOCN, CNS, CWON-AP [phone])

## 2023-03-02 NOTE — Progress Notes (Signed)
PHARMACY CONSULT NOTE - FOLLOW UP  Pharmacy Consult for Electrolyte Monitoring and Replacement   Recent Labs: Potassium (mmol/L)  Date Value  03/02/2023 4.5  08/11/2013 3.3 (L)   Magnesium (mg/dL)  Date Value  87/56/4332 2.5 (H)   Calcium (mg/dL)  Date Value  95/18/8416 9.9   Calcium, Total (mg/dL)  Date Value  60/63/0160 9.2   Albumin (g/dL)  Date Value  10/93/2355 2.3 (L)  08/11/2013 3.1 (L)   Phosphorus (mg/dL)  Date Value  73/22/0254 3.7   Sodium (mmol/L)  Date Value  03/02/2023 137  08/11/2013 138   Assessment: 73 y.o. 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   Goal of Therapy:  Electrolytes WNL  Plan:  --No electrolyte replacement warranted for today --Re-check electrolytes in AM  Tressie Ellis 03/02/2023 7:45 AM

## 2023-03-02 NOTE — Plan of Care (Signed)
  Problem: Fluid Volume: Goal: Hemodynamic stability will improve Outcome: Progressing   Problem: Clinical Measurements: Goal: Diagnostic test results will improve Outcome: Progressing Goal: Signs and symptoms of infection will decrease Outcome: Progressing   Problem: Education: Goal: Knowledge of General Education information will improve Description: Including pain rating scale, medication(s)/side effects and non-pharmacologic comfort measures Outcome: Progressing   Problem: Health Behavior/Discharge Planning: Goal: Ability to manage health-related needs will improve Outcome: Progressing   Problem: Clinical Measurements: Goal: Ability to maintain clinical measurements within normal limits will improve Outcome: Progressing Goal: Will remain free from infection Outcome: Progressing Goal: Diagnostic test results will improve Outcome: Progressing Goal: Cardiovascular complication will be avoided Outcome: Progressing   Problem: Activity: Goal: Risk for activity intolerance will decrease Outcome: Progressing   Problem: Nutrition: Goal: Adequate nutrition will be maintained Outcome: Progressing   Problem: Coping: Goal: Level of anxiety will decrease Outcome: Progressing   Problem: Elimination: Goal: Will not experience complications related to bowel motility Outcome: Progressing Goal: Will not experience complications related to urinary retention Outcome: Progressing   Problem: Pain Management: Goal: General experience of comfort will improve Outcome: Progressing   Problem: Safety: Goal: Ability to remain free from injury will improve Outcome: Progressing   Problem: Skin Integrity: Goal: Risk for impaired skin integrity will decrease Outcome: Progressing

## 2023-03-02 NOTE — TOC Progression Note (Signed)
Transition of Care [facility]) - Progression Note    Patient Details  Name: Alexandra Foster MRN: [id number] Date of Birth: 12-Jul-1950  Transition of Care [facility]) CM/SW Contact  Allena Katz, LCSW Phone Number: 03/02/2023, 4:18 PM  Clinical Narrative:    GOC discussion with family and MD. MD reports an anticipated hospital passing. TOC following for care plan updates.        Expected Discharge Plan and Services                                               Social Determinants of Health (SDOH) Interventions SDOH Screenings   Food Insecurity: No Food Insecurity (02/28/2023)  Housing: Patient Unable To Answer (02/23/2023)  Recent Concern: Housing - High Risk (12/06/2022)  Transportation Needs: No Transportation Needs (02/23/2023)  Utilities: Not At Risk (02/23/2023)  Alcohol Screen: Low Risk  (02/11/2021)  Depression (PHQ2-9): Medium Risk (02/27/2023)  Financial Resource Strain: Medium Risk (08/09/2020)  Physical Activity: Inactive (08/09/2020)  Social Connections: Moderately Integrated (08/09/2020)  Stress: No Stress Concern Present (08/09/2020)  Tobacco Use: Medium Risk (02/27/2023)    Readmission Risk Interventions    02/16/2023   12:26 PM 12/26/2022   11:15 AM  Readmission Risk Prevention Plan  Transportation Screening Complete Complete  PCP or Specialist Appt within 3-5 Days Complete   HRI or Home Care Consult Complete   Social Work Consult for Recovery Care Planning/Counseling Complete   Palliative Care Screening Not Applicable   Medication Review Oceanographer) Complete Complete  PCP or Specialist appointment within 3-5 days of discharge  Complete  HRI or Home Care Consult  Complete  SW Recovery Care/Counseling Consult  Complete  Palliative Care Screening  Not Applicable  Skilled Nursing Facility  Not Applicable

## 2023-03-02 NOTE — Progress Notes (Signed)
Received patient in bed to unit.    Informed consent signed and in chart.    TX duration: 3.5 hrs     Transported back to floor  Hand-off given to patient's nurse.   Access used:  rt chest cvc Access issues: n/a  Total UF removed: 400 mls Medication(s) given: Vancomycin 1000 mg       Maple Hudson, RN Dialysis Unit

## 2023-03-03 DIAGNOSIS — E2740 Unspecified adrenocortical insufficiency: Secondary | ICD-10-CM | POA: Diagnosis not present

## 2023-03-03 DIAGNOSIS — N186 End stage renal disease: Secondary | ICD-10-CM | POA: Diagnosis not present

## 2023-03-03 DIAGNOSIS — R0609 Other forms of dyspnea: Secondary | ICD-10-CM | POA: Diagnosis not present

## 2023-03-03 DIAGNOSIS — Z931 Gastrostomy status: Secondary | ICD-10-CM

## 2023-03-03 DIAGNOSIS — G4733 Obstructive sleep apnea (adult) (pediatric): Secondary | ICD-10-CM

## 2023-03-03 DIAGNOSIS — E1122 Type 2 diabetes mellitus with diabetic chronic kidney disease: Secondary | ICD-10-CM | POA: Diagnosis not present

## 2023-03-03 LAB — CBC
HCT: 29.7 % — ABNORMAL LOW (ref 36.0–46.0)
HCT: 31.1 % — ABNORMAL LOW (ref 36.0–46.0)
Hemoglobin: 9.4 g/dL — ABNORMAL LOW (ref 12.0–15.0)
Hemoglobin: 10.1 g/dL — ABNORMAL LOW (ref 12.0–15.0)
MCH: 25.6 pg — ABNORMAL LOW (ref 26.0–34.0)
MCH: 26 pg (ref 26.0–34.0)
MCHC: 31.6 g/dL (ref 30.0–36.0)
MCHC: 32.5 g/dL (ref 30.0–36.0)
MCV: 80.9 fL (ref 80.0–100.0)
MCV: 79.9 fL — ABNORMAL LOW (ref 80.0–100.0)
Platelets: 296 10*3/uL (ref 150–400)
Platelets: 327 10*3/uL (ref 150–400)
RBC: 3.67 MIL/uL — ABNORMAL LOW (ref 3.87–5.11)
RBC: 3.89 MIL/uL (ref 3.87–5.11)
RDW: 15.6 % — ABNORMAL HIGH (ref 11.5–15.5)
RDW: 15.6 % — ABNORMAL HIGH (ref 11.5–15.5)
WBC: 17.4 10*3/uL — ABNORMAL HIGH (ref 4.0–10.5)
WBC: 15.1 10*3/uL — ABNORMAL HIGH (ref 4.0–10.5)
nRBC: 0.2 % (ref 0.0–0.2)
nRBC: 0.2 % (ref 0.0–0.2)

## 2023-03-03 LAB — GLUCOSE, CAPILLARY
Glucose-Capillary: 108 mg/dL — ABNORMAL HIGH (ref 70–99)
Glucose-Capillary: 109 mg/dL — ABNORMAL HIGH (ref 70–99)
Glucose-Capillary: 110 mg/dL — ABNORMAL HIGH (ref 70–99)
Glucose-Capillary: 84 mg/dL (ref 70–99)
Glucose-Capillary: 96 mg/dL (ref 70–99)
Glucose-Capillary: 99 mg/dL (ref 70–99)

## 2023-03-03 LAB — MAGNESIUM: Magnesium: 1.8 mg/dL (ref 1.7–2.4)

## 2023-03-03 LAB — BASIC METABOLIC PANEL
Anion gap: 7 (ref 5–15)
BUN: 34 mg/dL — ABNORMAL HIGH (ref 8–23)
CO2: 27 mmol/L (ref 22–32)
Calcium: 8.3 mg/dL — ABNORMAL LOW (ref 8.9–10.3)
Chloride: 97 mmol/L — ABNORMAL LOW (ref 98–111)
Creatinine, Ser: 2.66 mg/dL — ABNORMAL HIGH (ref 0.44–1.00)
GFR, Estimated: 18 mL/min — ABNORMAL LOW (ref >60)
Glucose, Bld: 101 mg/dL — ABNORMAL HIGH (ref 70–99)
Potassium: 2.8 mmol/L — ABNORMAL LOW (ref 3.5–5.1)
Sodium: 131 mmol/L — ABNORMAL LOW (ref 135–145)

## 2023-03-03 LAB — RENAL FUNCTION PANEL
Albumin: 2.4 g/dL — ABNORMAL LOW (ref 3.5–5.0)
Anion gap: 6 (ref 5–15)
BUN: 44 mg/dL — ABNORMAL HIGH (ref 8–23)
CO2: 26 mmol/L (ref 22–32)
Calcium: 8.4 mg/dL — ABNORMAL LOW (ref 8.9–10.3)
Chloride: 98 mmol/L (ref 98–111)
Creatinine, Ser: 3.2 mg/dL — ABNORMAL HIGH (ref 0.44–1.00)
GFR, Estimated: 15 mL/min — ABNORMAL LOW (ref >60)
Glucose, Bld: 111 mg/dL — ABNORMAL HIGH (ref 70–99)
Phosphorus: 2.5 mg/dL (ref 2.5–4.6)
Potassium: 4 mmol/L (ref 3.5–5.1)
Sodium: 130 mmol/L — ABNORMAL LOW (ref 135–145)

## 2023-03-03 LAB — MRSA NEXT GEN BY PCR, NASAL: MRSA by PCR Next Gen: DETECTED — AB

## 2023-03-03 LAB — PHOSPHORUS: Phosphorus: 1.8 mg/dL — ABNORMAL LOW (ref 2.5–4.6)

## 2023-03-03 LAB — HEPATITIS B SURFACE ANTIBODY, QUANTITATIVE: Hep B S AB Quant (Post): <3.5 m[IU]/mL — ABNORMAL LOW

## 2023-03-03 LAB — C-REACTIVE PROTEIN: CRP: 4.6 mg/dL — ABNORMAL HIGH (ref <1.0)

## 2023-03-03 MED ORDER — HEPARIN SODIUM (PORCINE) 1000 UNIT/ML DIALYSIS
1000.0000 [IU] | INTRAMUSCULAR | Status: DC | PRN
Start: 2023-03-03 — End: 2023-03-05
  Administered 2023-03-03: 3300 [IU]
  Administered 2023-03-05: 1000 [IU]
  Filled 2023-03-03: qty 1

## 2023-03-03 MED ORDER — HEPARIN SODIUM (PORCINE) 1000 UNIT/ML DIALYSIS
1000.0000 [IU] | INTRAMUSCULAR | Status: DC | PRN
  Administered 2023-03-05: 1000 [IU]
  Filled 2023-03-03: qty 1

## 2023-03-03 MED ORDER — ALTEPLASE 2 MG IJ SOLR: 2.0000 mg | Freq: Once | INTRAMUSCULAR | Status: DC | PRN

## 2023-03-03 MED ORDER — POTASSIUM CHLORIDE 10 MEQ/100ML IV SOLN
10.0000 meq | INTRAVENOUS | Status: AC
Start: 2023-03-03 — End: 2023-03-03
  Administered 2023-03-03 (×2): 10 meq via INTRAVENOUS
  Filled 2023-03-03 (×2): qty 100

## 2023-03-03 MED ORDER — POTASSIUM CHLORIDE 20 MEQ PO PACK
20.0000 meq | PACK | Freq: Once | ORAL | Status: AC
  Administered 2023-03-03: 20 meq
  Filled 2023-03-03: qty 1

## 2023-03-03 MED ORDER — NOREPINEPHRINE 16 MG/250ML-% IV SOLN
0.0000 ug/min | INTRAVENOUS | Status: DC
  Administered 2023-03-03: 2 ug/min via INTRAVENOUS
  Filled 2023-03-03: qty 250

## 2023-03-03 MED ORDER — MAGNESIUM SULFATE 2 GM/50ML IV SOLN
2.0000 g | Freq: Once | INTRAVENOUS | Status: AC
  Administered 2023-03-03: 2 g via INTRAVENOUS
  Filled 2023-03-03: qty 50

## 2023-03-03 MED ORDER — POTASSIUM & SODIUM PHOSPHATES 280-160-250 MG PO PACK
1.0000 | PACK | ORAL | Status: AC
  Administered 2023-03-03 (×4): 1
  Filled 2023-03-03 (×4): qty 1

## 2023-03-03 NOTE — Progress Notes (Addendum)
PHARMACY CONSULT NOTE - FOLLOW UP  Pharmacy Consult for Electrolyte Monitoring and Replacement   Recent Labs: Potassium (mmol/L)  Date Value  03/03/2023 2.8 (L)  08/11/2013 3.3 (L)   Magnesium (mg/dL)  Date Value  16/01/9603 1.8   Calcium (mg/dL)  Date Value  54/12/8117 8.3 (L)   Calcium, Total (mg/dL)  Date Value  14/78/2956 9.2   Albumin (g/dL)  Date Value  21/30/8657 2.3 (L)  08/11/2013 3.1 (L)   Phosphorus (mg/dL)  Date Value  84/69/6295 1.8 (L)   Sodium (mmol/L)  Date Value  03/03/2023 131 (L)  08/11/2013 138   Assessment: 72 y.o. 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   Patient had HD 11/18. Labs notable for several electrolyte abnormalities today  Goal of Therapy:  Electrolytes WNL  Plan:  --K 2.8, Kcl 10 mEq IV x 2 doses per provider. Will order an additional Kcl 20 mEq per tube x 1 dose --Mg 1.8, magnesium sulfate 2 g IV x 1 dose --Phos 1.8, Phos-Nak 1 packet per tube q4h x 4 doses ordered --Re-check electrolytes in AM  Tressie Ellis 03/03/2023 9:29 AM

## 2023-03-03 NOTE — Progress Notes (Signed)
Patient has an allergy to chlorhexidine. Patient bathed earlier with soap and water. Continue to assess.

## 2023-03-03 NOTE — Progress Notes (Signed)
NAME:  Alexandra Foster, MRN:  [id number], DOB:  [birth year], LOS: 4 ADMISSION DATE:  02/27/2023, CONSULTATION DATE:  03/01/2023 REFERRING MD:  Manuela Schwartz REASON FOR CONSULT:  Hypotension   HPI  72 y.o with significant PMH of Asthma, COPD, type diabetes mellitus, s/p G-tube, GERD, ESRD on HD(TuThSa), hypertension, osteoarthritis, paroxysmal atrial fibrillation and OSA on CPAP who was sent to the ED from her PCP's office for evaluation of possible sepsis.  Patient has had multiple admission this year with different medical issues most recently on 8/23/24with Acute Metabolic Encephalopathy in setting of Severe Sepsis with Septic Shock due to MRSA Bacteremia from infected right chest Permcath, along with Enterococcus Faecalis UTI and TIA.   ED Course: Initial vital signs showed HR of 64 beats/minute, BP 108/82 mm Hg, the RR 23 breaths/minute, and the oxygen saturation 99% on RA and a temperature of 98.11F (37.0C).  Pertinent Labs/Diagnostics Findings: Na+/ K+: 133/3.3 Glucose: 89 BUN/Cr.: 27/2.30  AST/ALT:59/54 WBC: 15.7  PCT: 0.22  Lactic acid: 1.2 CXR> CTA Chest Abd/pelvis>see results Medication administered in the HQ:IONG Disposition:Hospitalist consulted for admission to medsurg unit. Patient was started on broad spectrum abx for suspected sepsis of unknown source.  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 PCCM consulted 03/02/23- patient continues to shows signs of discomfort, she has poor prognosis with multiorgan failure.  Patients family is coming in  today to discuss goals of care.  03/02/28- patient with improved leukocytosis and reduced CRP.    Consults:  Nephrology PCCM  Procedures:  11/17: Left internal jugular Central Line   Significant Diagnostic Tests:  11/17: Chest Xray> FINDINGS: Left internal jugular central line placement with the tip in the SVC. Right dialysis catheter is unchanged. No pneumothorax. Heart and mediastinal contours are within normal limits. No confluent opacities, effusions or overt edema. No acute bony abnormality.  11/15: Chest Xray> IMPRESSION: 1. Low lung volumes with left basilar airspace disease concerning for pneumonia. 2. New right IJ dialysis catheter without complicating features.  11/15: CTA Chest, abdomen and pelvis> IMPRESSION: 1. Trace bilateral pleural effusions with minimal dependent lower lobe atelectasis. 2. Cirrhosis. 3. Stable nonobstructing right renal calculi. 4. Cholelithiasis without cholecystitis. 5. Sigmoid diverticulosis without diverticulitis. 6. 6 mm mean diameter right lower lobe pulmonary nodule, stable for 3 months. Non-contrast chest CT at 3-9 months is recommended to document 1 year stability. If the nodule is stable at time of repeat CT, then future CT at 15-21 months (from today's scan) is considered optional for low-risk patients, but is recommended for high-risk patients. This recommendation follows the consensus statement: Guidelines for Management of Incidental Pulmonary Nodules Detected on CT Images: From the Fleischner Society 2017; Radiology 2017; 284:228-243. 7.  Aortic Atherosclerosis (ICD10-I70.0).  Interim History / Subjective:      Micro Data:  11/15: Blood culture x2>No growth  Antimicrobials:  11/16 Unasyn >>  11/16 Vancomycin  OBJECTIVE  Blood pressure (!) 89/46, pulse 65, temperature 98.6 F (37 C), resp. rate 18, height 5\' 4"  (1.626 m), weight 86.3 kg, SpO2 94%.      Intake/Output Summary (Last 24 hours) at 03/03/2023  0754 Last data  filed at 03/03/2023 0600 Gross per 24 hour  Intake 1350 ml  Output 400 ml  Net 950 ml   Filed Weights   03/01/23 0045 03/03/23 0500  Weight: 85.5 kg 86.3 kg    Physical Examination  GENERAL:  year-old critically ill patient lying in the bed  EYES: PEERLA. No scleral icterus. Extraocular muscles intact.  HEENT: Head atraumatic, normocephalic. Oropharynx and nasopharynx clear.  NECK:  No JVD, supple  LUNGS: Decreased breath sounds bilaterally.  No use of accessory muscles of respiration.  CARDIOVASCULAR: S1, S2 normal. No murmurs, rubs, or gallops.  ABDOMEN: Soft, NTND EXTREMITIES: LBKA. Capillary refill < 3 seconds in all extremities. Pulses palpable distally. NEUROLOGIC: The patient is . No focal neurological deficit appreciated. Cranial nerves are intact.  SKIN:          Labs/imaging that I havepersonally reviewed  (right click and "Reselect all SmartList Selections" daily)     Labs   CBC: Recent Labs  Lab 02/27/23 1449 02/28/23 0621 03/01/23 0255 03/02/23 0441 03/03/23 0415  WBC 15.7* 32.3* 42.8* 28.6* 17.4*  NEUTROABS 8.8*  --   --   --   --   HGB 12.4 14.1 13.0 11.0* 9.4*  HCT 39.8 45.9 41.6 34.8* 29.7*  MCV 82.4 83.6 81.4 79.3* 80.9  PLT 388 367 413* 359 296    Basic Metabolic Panel: Recent Labs  Lab 02/27/23 1449 02/28/23 0621 03/01/23 0255 03/02/23 0440 03/02/23 0441 03/03/23 0415  NA 133* 134* 134* 137  --  131*  K 3.3* 4.0 4.9 4.5  --  2.8*  CL 100 100 101 103  --  97*  CO2 24 21* 20* 20*  --  27  GLUCOSE 89 137* 158* 152*  --  101*  BUN 27* 33* 49* 76*  --  34*  CREATININE 2.30* 3.07* 4.48* 5.57*  --  2.66*  CALCIUM 9.2 9.4 9.3 9.9  --  8.3*  MG  --   --   --   --  2.5* 1.8  PHOS  --   --   --  3.7  --  1.8*   GFR: Estimated Creatinine Clearance: 19.7 mL/min (A) (by C-G formula based on SCr of 2.66 mg/dL (H)). Recent Labs  Lab 02/27/23 1449 02/27/23 1453 02/28/23 0621 03/01/23 0255 03/02/23 0441 03/03/23 0415   PROCALCITON  --  0.22 13.10  --   --   --   WBC 15.7*  --  32.3* 42.8* 28.6* 17.4*  LATICACIDVEN 1.8  --   --  2.7*  --   --     Liver Function Tests: Recent Labs  Lab 02/27/23 1449 03/01/23 0255 03/02/23 0440  AST 59* 91*  --   ALT 74* 92*  --   ALKPHOS 157* 102  --   BILITOT 0.5 0.9  --   PROT 7.3 6.4*  --   ALBUMIN 3.0* 2.5* 2.3*   No results for input(s): "LIPASE", "AMYLASE" in the last 168 hours. No results for input(s): "AMMONIA" in the last 168 hours.  ABG    Component Value Date/Time   PHART 7.40 05/14/2021 0341   PCO2ART 45 05/14/2021 0341   PO2ART 129 (H) 05/14/2021 0341   HCO3 21.6 02/14/2023 0937   ACIDBASEDEF 4.3 (H) 02/14/2023 0937   O2SAT 87 02/14/2023 0937     Coagulation Profile: Recent Labs  Lab 02/27/23 1449  INR 1.0    Cardiac Enzymes: No results for input(s): "CKTOTAL", "CKMB", "CKMBINDEX", "TROPONINI" in the last 168 hours.  HbA1C:  Hgb A1c MFr Bld  Date/Time Value Ref Range Status  02/11/2023 02:55 PM 4.7 <5.7 % of total Hgb Final    Comment:    For the purpose of screening for the presence of diabetes: . <5.7%       Consistent with the absence of diabetes 5.7-6.4%    Consistent with increased risk for diabetes             (prediabetes) > or =6.5%  Consistent with diabetes . This assay result is consistent with a decreased risk of diabetes. . Currently, no consensus exists regarding use of hemoglobin A1c for diagnosis of diabetes in children. . According to American Diabetes Association (ADA) guidelines, hemoglobin A1c <7.0% represents optimal control in non-pregnant diabetic patients. Different metrics may apply to specific patient populations.  Standards of Medical Care in Diabetes(ADA). [name]   12/07/2022 02:13 AM 5.2 4.8 - 5.6 % Final    Comment:    (NOTE) Pre diabetes:          5.7%-6.4%  Diabetes:              >6.4%  Glycemic control for   <7.0% adults with diabetes     CBG: Recent Labs  Lab 03/02/23 1121  03/02/23 1925 03/02/23 2308 03/03/23 0354 03/03/23 0720  GLUCAP 111* 120* 104* 99 110*    Review of Systems:   Unable to be obtained secondary to the patient's altered mental status.    Past Medical History  She,  has a past medical history of AKI (acute kidney injury) (HCC), Arthritis, Asthma, Bacteremia, COPD (chronic obstructive pulmonary disease) (HCC), Diabetes mellitus without complication (HCC), Endometriosis, GERD (gastroesophageal reflux disease), History of echocardiogram, Hypertension, Obesity, PAF (paroxysmal atrial fibrillation) (HCC), and Sleep apnea.   Surgical History    Past Surgical History:  Procedure Laterality Date   ABDOMINAL HYSTERECTOMY     APPLICATION OF WOUND VAC Left 05/17/2021   Procedure: APPLICATION OF WOUND VAC/WOUND VAC EXCHANGE-Matrix Myriad;  Surgeon: Carolan Shiver, MD;  Location: [facility];  Service: General;  Laterality: Left;   APPLICATION OF WOUND VAC  05/24/2021   Procedure: APPLICATION OF WOUND VAC;  Surgeon: Carolan Shiver, MD;  Location: [facility];  Service: General;;   APPLICATION OF WOUND VAC  05/10/2021   Procedure: APPLICATION OF WOUND VAC;  Surgeon: Carolan Shiver, MD;  Location: [facility];  Service: General;;   COLONOSCOPY  10/29/2006   Dr Servando Snare   COLONOSCOPY WITH PROPOFOL N/A 11/05/2016   Procedure: COLONOSCOPY WITH PROPOFOL;  Surgeon: Earline Mayotte, MD;  Location: [facility];  Service: Endoscopy;  Laterality: N/A;   DIALYSIS/PERMA CATHETER INSERTION N/A 12/19/2022   Procedure: DIALYSIS/PERMA CATHETER INSERTION;  Surgeon: Annice Needy, MD;  Location: [facility];  Service: Cardiovascular;  Laterality: N/A;   DIALYSIS/PERMA CATHETER INSERTION N/A 02/04/2023   Procedure: DIALYSIS/PERMA CATHETER INSERTION;  Surgeon: Annice Needy, MD;  Location: [facility];  Service: Cardiovascular;  Laterality: N/A;   DIALYSIS/PERMA CATHETER INSERTION N/A 02/16/2023   Procedure: DIALYSIS/PERMA CATHETER INSERTION;   Surgeon: Annice Needy, MD;  Location: [facility];  Service: Cardiovascular;  Laterality: N/A;   INCISION AND DRAINAGE OF WOUND Left 05/24/2021   Procedure: IRRIGATION AND DEBRIDEMENT LEFT LEG;  Surgeon: Carolan Shiver, MD;  Location: [facility];  Service: General;  Laterality: Left;   INCISION AND DRAINAGE OF WOUND Left 05/10/2021   Procedure: IRRIGATION AND DEBRIDEMENT LEFT LEG;  Surgeon: Carolan Shiver, MD;  Location: [facility];  Service: General;  Laterality: Left;   IR FLUORO GUIDE CV LINE RIGHT  06/12/2021   IR PERC TUN PERIT CATH WO PORT S&I /IMAG  06/12/2021   IR REPLC GASTRO/COLONIC TUBE PERCUT W/FLUORO  06/12/2021   IVC FILTER INSERTION N/A 05/29/2021   Procedure: IVC FILTER INSERTION;  Surgeon: Renford Dills, MD;  Location: [facility];  Service: Cardiovascular;  Laterality: N/A;   MINOR GRAFT APPLICATION  05/24/2021   Procedure: Myriad Matrix  APPLICATION;  Surgeon: Carolan Shiver, MD;  Location: [facility];  Service: General;;   NASAL SINUS SURGERY  2002   Dr Chestine Spore   PEG PLACEMENT N/A 05/28/2021   Procedure: PERCUTANEOUS ENDOSCOPIC GASTROSTOMY (PEG) PLACEMENT;  Surgeon: Sung Amabile, DO;  Location: [facility];  Service: General;  Laterality: N/A;  TRAVEL CASE   TRACHEOSTOMY TUBE PLACEMENT N/A 05/17/2021   Procedure: TRACHEOSTOMY;  Surgeon: Geanie Logan, MD;  Location: [facility];  Service: ENT;  Laterality: N/A;   WOUND DEBRIDEMENT Left 05/07/2021   Procedure: DEBRIDEMENT WOUND;  Surgeon: Carolan Shiver, MD;  Location: [facility];  Service: General;  Laterality: Left;     Social History   reports that she quit smoking about 21 years ago. Her smoking use included cigarettes. She started smoking about 61 years ago. She has a 80 pack-year smoking history. She quit smokeless tobacco use about 21 years ago.  Her smokeless tobacco use included snuff. She reports that she does not drink alcohol and does not use drugs.   Family History   Her family  history includes Alcohol abuse in her brother; Breast cancer (age of onset: 77) in her sister; Congestive Heart Failure in her mother; Coronary artery disease (age of onset: 5) in her father; Heart disease in her brother; Varicose Veins in her brother.   Allergies Allergies  Allergen Reactions   Chlorhexidine Dermatitis     Home Medications  Prior to Admission medications   Medication Sig Start Date End Date Taking? Authorizing Provider  ascorbic acid (VITAMIN C) 500 MG tablet Place 1 tablet (500 mg total) into feeding tube 2 (two) times daily. 06/13/21  Yes Erin Fulling, MD  atorvastatin (LIPITOR) 20 MG tablet Place 1 tablet (20 mg total) into feeding tube daily. 02/12/23  Yes Sowles, [name], MD  cholestyramine Lanetta Inch) 4 g packet Place 1 packet (4 g total) into feeding tube 3 (three) times daily. 02/11/23  Yes Sowles, [name], MD  clonazePAM (KLONOPIN) 0.5 MG tablet Take 0.5-1 tablets (0.25-0.5 mg total) by mouth daily as needed for anxiety. 02/11/23  Yes Sowles, [name], MD  escitalopram (LEXAPRO) 10 MG tablet Place 1 tablet (10 mg total) into feeding tube daily. 02/11/23  Yes Sowles, [name], MD  famotidine (PEPCID) 10 MG tablet Place 1 tablet (10 mg total) into feeding tube daily. 02/11/23  Yes Sowles, [name], MD  fludrocortisone (FLORINEF) 0.1 MG tablet Place 2 tablets (0.2 mg total) into feeding tube daily. 12/30/22  Yes Wieting, Richard, MD  hydrOXYzine (ATARAX) 50 MG tablet Take 2 tablets (100 mg total) by mouth 2 (two) times daily. Home med. 02/20/23  Yes [name], MD  metoCLOPramide (REGLAN) 5 MG tablet Take 1 tablet (5 mg total) by mouth 3 (three) times daily before meals. 02/11/23  Yes Sowles, [name], MD  midodrine (PROAMATINE) 10 MG tablet Place 2 tablets (20 mg total) into feeding tube 3 (three) times daily with meals. 02/12/23  Yes Sowles, [name], MD  multivitamin (RENA-VIT) TABS tablet Place 1 tablet into feeding tube at bedtime. 02/04/23  Yes [name], MD  traZODone  (DESYREL) 50 MG tablet Take 0.5 tablets (25 mg total) by mouth at bedtime as needed for sleep. 02/11/23  Yes Sowles, [name], MD  vitamin D3 (CHOLECALCIFEROL) 25 MCG tablet Place 2 tablets (2,000 Units total) into feeding tube daily. 01/02/23  Yes Enedina Finner, MD  acetaminophen (TYLENOL) 325 MG tablet Place 2 tablets (650 mg total) into feeding tube every 6 (six) hours as needed for mild pain (or Fever >/= 101). 12/29/22   Alford Highland, MD  amiodarone (PACERONE) 200 MG tablet Hold due to low heart rate, pending outpatient cardiology followup. Patient not taking: Reported on 02/23/2023 02/20/23   [name], MD  diphenoxylate-atropine (LOMOTIL) 2.5-0.025 MG tablet 1 tablet by Per J Tube route 4 (four) times daily as needed for diarrhea or loose stools. Patient not taking: Reported on 02/23/2023 12/29/22   Alford Highland, MD  glucose blood (ACCU-CHEK GUIDE) test strip Use as instructed 02/23/23   [name], MD  liver oil-zinc oxide (DESITIN) 40 % ointment Apply topically 3 (three) times daily as needed for irritation. Apply to skin around G tube 3 times daily as needed for skin irritation 02/20/23   [name], MD  Nutritional Supplements (FEEDING SUPPLEMENT, NEPRO CARB STEADY,) LIQD Place 1,120 mLs into feeding tube daily. 12/29/22   Alford Highland, MD  Nutritional Supplements (FEEDING SUPPLEMENT, NEPRO CARB STEADY,) LIQD Place 1,120 mLs into feeding tube daily. 01/02/23   Enedina Finner, MD  nystatin (MYCOSTATIN/NYSTOP) powder Apply topically 3 (three) times daily. 12/29/22   Alford Highland, MD  polyvinyl alcohol (LIQUIFILM TEARS) 1.4 % ophthalmic solution Place 1 drop into both eyes as needed for dry eyes. Patient not taking: Reported on 02/23/2023 12/29/22   Alford Highland, MD  Water For Irrigation, Sterile (FREE WATER) SOLN Place 50 mLs into feeding tube every 4 (four) hours. 12/29/22   Alford Highland, MD  Scheduled Meds:  ascorbic acid  500 mg Per Tube BID   atorvastatin  20 mg Per Tube  Daily   vitamin D3  2,000 Units Per Tube Daily   cholestyramine  4 g Per Tube TID   escitalopram  10 mg Per Tube Daily   famotidine  10 mg Per Tube Daily   feeding supplement (NEPRO CARB STEADY)  1,000 mL Per Tube Q24H   feeding supplement (PROSource TF20)  60 mL Per Tube Daily   fludrocortisone  0.2 mg Per Tube Daily   free water  30 mL Per Tube Q4H   heparin  5,000 Units Subcutaneous Q8H   hydrOXYzine  100 mg Per Tube BID   insulin aspart  1-3 Units Subcutaneous Q4H   metoCLOPramide (REGLAN) injection  10 mg Intravenous Q8H   midodrine  20 mg Per Tube TID WC   multivitamin  1 tablet Per Tube QHS   nutrition supplement (JUVEN)  1 packet Per Tube BID BM   nystatin   Topical TID   potassium & sodium phosphates  1 packet Per Tube Q4H   sodium chloride flush  10-40 mL Intracatheter Q12H   Continuous Infusions:  piperacillin-tazobactam (ZOSYN)  IV 2.25 g (03/03/23 0413)   potassium chloride     PRN Meds:.acetaminophen **OR** acetaminophen, bisacodyl, clonazePAM, liver oil-zinc oxide, LORazepam, ondansetron **OR** ondansetron (ZOFRAN) IV, senna-docusate, sodium chloride flush, zinc oxide  Active Hospital Problem list   See systems below  Assessment & Plan:     1#Septic Shock -  PRESENT ON ADMISSION - DUE TO PNEUMONIA AND INFECTED CHRONIC SOFT TISSUE INFECTION         -  Continue antibiotics         -infectious disease consultation - patient with recurrent infections -          -cardiology consultation - patient had MRSA bacteremia in the  Recent MRSA BACTEREMIA due to infected Permcath & Enterococcus Faecalis UTI Permcath was removed on 8/24 -Trend Lactate, procal, WBC and monitor fever curve  -continue vancomycin pending cultures & sensitivities  will switch to Zosyn for broader coverage -ID previously recommended TEE ~ pts family declined due to concerns of pt receiving sedation~ ID previously recommended 6 weeks of Vancomycin if unable to undergo TEE   2# - Left AKA chronic  VTE per Venous US 12/17/22 (IVC filter placed 05/29/21) PMHx: Paroxsymal Atrial Fibrillation, HTN, HLD Echocardiogram 02/15/2023: LVEF 60-65%, RV systolic function low normal, RV size mildly enlarged, trivial mitral regurgitation -Check Lactate -Vasopressors as needed to maintain MAP goal -continue Midodrine to 20 mg TID, -start stress dose steroids -Continue Atorvastatin -Hold outpatient amiodarone     #3ESRD on Hemodialysis TTHS - nephrolithiasis #4 cholelithiasis - chronic - monitor for possible pancreatitis vs acute cholecystitis #5 OSA on CPAP Supplemental O2 as needed to maintain O2 sats 88 to 92% -CPAP qhs -Follow intermittent CXR & ABG as needed -Bronchodilators prn -Pulmonary toilet as able -ABX as above # Hyponatremia #Hypokalemia  Unable to complete HD 02/28/23 due to hypotension -Trend BMP  -Ensure adequate renal perfusion -Replace electrolytes as indicated ~ Pharmacy following for assistance with electrolyte replacement -Nephrology following, appreciate input: HD per recommendations   #COPD without acute exacerbation -continue current nebulizer and antimicrobials    #Anemia of chronic kidney disease -Trend CBC -Monitor for s/sx of bleeding  -VTE prophylaxis: subcutaneous heparin  -Transfuse for Hgb <7   #Hx of Adrenal Insufficiency -Repeat Cortisol is 51.0  (previously 35.9) ~ continue Florinef -Check TSH and free T4    #Severe irritant dermatitis and Skin tears at abdominal folds and buttocks  - Turn q2hrs  - Wound care consult    #Type 2 diabetes mellitus - CBG's q4h; Target range of 140 to 180 - SSI - Follow ICU Hypo/Hyperglycemia protocol - Hold outpatient metformin  #Anxiety and depression -Resume home Lexapro 10 mg daily, hydroxyzine 100 mg twice daily, clonazepam 0.5 mg daily as needed   Best practice:  Diet:  Tube Feed  Pain/Anxiety/Delirium protocol (if indicated): No VAP protocol (if indicated): Not indicated DVT prophylaxis:  Subcutaneous Heparin GI prophylaxis: H2B Glucose control:  SSI Yes Central venous access:  Yes, and it is still needed Arterial line:  N/A Foley:  N/A Mobility:  bed rest  PT consulted: N/A Last date of multidisciplinary goals of care discussion [11/17] Code Status:  full code Disposition: ICU   = Goals of Care = Code Status Order: FULL  Primary Emergency Contact: Noack,[name], Home Phone: [phone] Wishes to pursue full aggressive treatment and intervention options, including CPR and intubation, but goals of care will be addressed on going with family if that should become necessary.   Critical care provider statement:   Total critical care time: 33 minutes   Performed by: Karna Christmas MD   Critical care time was exclusive of separately billable procedures and treating other patients.   Critical care was necessary to treat or prevent imminent or life-threatening deterioration.   Critical care was time spent personally by me on the following activities: development of treatment plan with patient and/or surrogate as well as nursing, discussions with consultants, evaluation of patient's response to treatment, examination of patient, obtaining history  from patient or surrogate, ordering and performing treatments and interventions, ordering and review of laboratory studies, ordering and review of radiographic studies, pulse oximetry and re-evaluation of patient's condition.    Vida Rigger, M.D.  Pulmonary & Critical Care Medicine

## 2023-03-03 NOTE — Progress Notes (Signed)
Received patient at bedside in ICU.  Alert and oriented to person.  Informed consent signed and in chart.   TX duration: 2:00  Patient tolerated well.  Patient condition stable upon this treatment discharge.   Alert, without acute distress.  Hand-off given to patient's nurse.   Access used: RIJ TDC Access issues: None  Total UF removed: 1000 mL Medication(s) given: None Post HD VS: please see data insert    03/03/23 1930  Vitals  Temp 98.6 F (37 C)  Temp Source Axillary  BP 131/67  MAP (mmHg) 84  BP Location Left Arm  BP Method Automatic  Patient Position (if appropriate) Lying  Pulse Rate 64  Pulse Rate Source Monitor  ECG Heart Rate 63  Oxygen Therapy  SpO2 97 %  O2 Device Room Air  Patient Activity (if Appropriate) In bed  Pulse Oximetry Type Continuous  Post Treatment  Dialyzer Clearance Lightly streaked  Hemodialysis Intake (mL) 0 mL  Liters Processed 43.2  Fluid Removed (mL) 1000 mL  Tolerated HD Treatment Yes  Post-Hemodialysis Comments Treatment completed and blood returned without issue.  Note  Patient Observations Patient alert, no c/o voiced, no acute distress noted; patient condition stable upon this treatment discharge.  Hemodialysis Catheter Right Internal jugular Double lumen Permanent (Tunneled)  Placement Date/Time: 02/16/23 1434   Serial / Lot #: [id number]  Expiration Date: 09/20/27  Time Out: Correct patient;Correct site;Correct procedure  Maximum sterile barrier precautions: Hand hygiene;Large sterile sheet;Sterile probe cover;Cap;Mask;St...  Site Condition No complications  Blue Lumen Status Flushed;Heparin locked;Dead end cap in place  Red Lumen Status Flushed;Heparin locked;Dead end cap in place  Purple Lumen Status N/A  Catheter fill solution Heparin 1000 units/ml  Catheter fill volume (Arterial) 1.6 cc  Catheter fill volume (Venous) 1.7  Dressing Type Transparent  Dressing Status Antimicrobial disc in place;Clean, Dry, Intact   Drainage Description None  Post treatment catheter status Capped and Clamped      [facility]

## 2023-03-03 NOTE — Progress Notes (Signed)
Progress Note   Patient: Alexandra Foster ACZ:[id number] DOB: Feb 11, 1951 DOA: 02/27/2023     4 DOS: the patient was seen and examined on 03/03/2023   Brief hospital course: 72 y.o with significant PMH of Asthma, COPD, type diabetes mellitus, s/p G-tube, GERD, ESRD on HD(TuThSa), hypertension, osteoarthritis, paroxysmal atrial fibrillation and OSA on CPAP who was sent to the ED from her PCP's office for evaluation of possible sepsis. Patient had multiple prior admissions, recent septic shock due to MRSA bacteremia from infected PermCath, Enterococcus UTI.  Patient got vancomycin therapy for 10 days after new PermCath with insertion.   Patient was initially admitted to hospitalist service with impression of possible sepsis, started on broad-spectrum antibiotics cefepime, vancomycin.  On 03/01/2023 patient became hypotensive during hemodialysis, ICU team consulted who started him on vasopressor, stress to steroids, midodrine.  Patient's overall prognosis is poor, ICU attending discussed with patient's family regarding goals of care, she has made DNI but family wants CPR to be done.  Patient's blood pressure is better, tolerated hemodialysis transferred to [facility] service.  Assessment and Plan: Septic shock Possible aspiration pneumonia Chronic infected decubitus ulcers Recent history of MRSA bacteremia status post removal of cath 8/24. Patient will be continued on vancomycin and Zosyn therapy for broader coverage. Patient's family declined TEE given high risk of respiratory failure due to sedative medications during procedure. Patient's white count is improving, will trend Lactic acid, procal.  Chronic hypotension. Off pressors currently. Patient does have history of adrenal insufficiency. Continue midodrine 20 mg 3 times per day. Continue Florinef therapy.  ESRD on HD Continue hemodialysis per nephrology team. Patient did receive hemodialysis yesterday.  Diarrhea- Seems chronic. If  persistent foul smelling loose stool will get c.diff Rectal tube ordered.  Hyponatremia Hypokalemia Hypophosphatemia Electrolyte abnormalities after hemodialysis. Pharmacy consult ordered for electrolyte management. IV replacement for potassium, magnesium ordered. Oral phosphorus replacement ordered.  Anemia of Chronic disease- Hemoglobin around 10. No active bleeding at this time. Continue to monitor H&H transfuse if hemoglobin less than 7. Continue erythropoietin as per nephrology.  Paroxysmal atrial fibrillation- Patient's home dose amiodarone therapy on hold due to bradycardia. Outpatient cardiology follow-up.  COPD OSA on CPAP No exacerbation of COPD. Continue current nebulizer treatments.  Anxiety and depression: Patient will be continued on home dose Lexapro, Atarax, Klonopin therapy.  History of left AKA Pressure injury of skin Dysphagia status post G-tube in place- Continue tube feeds as per dietitian recommendations. Nursing skin care per wound consult.  Obesity BMI 32.66 Nutrition Documentation    Flowsheet Row ED to Hosp-Admission (Current) from 02/27/2023 in [facility] ICU/CCU  Nutrition Problem Inadequate oral intake  Etiology dysphagia  Nutrition Goal Patient will meet greater than or equal to 90% of their needs  Interventions Tube feeding      Out of bed to chair. Incentive spirometry if able to  Fall, aspiration precautions. DVT prophylaxis- Heparin sq   Code Status: Prior  Subjective: Patient is seen and examined today morning.  RN notified that her potassium, phosphorus levels low.  Patient's blood pressures lower side but better. Has loose stools, She is able to answer me and slow voice.  Able to tolerate tube feeds.  Upon questioning about her CODE STATUS she is unable to answer.  Physical Exam: Vitals:   03/03/23 1000 03/03/23 1100 03/03/23 1200 03/03/23 1300  BP: (!) 94/46 (!) 82/46 (!) 90/48 (!) 90/45  Pulse: 61  61 63 (!) 58  Resp: 18 17 17 17   Temp:  98.9 F (37.2 C)  TempSrc:      SpO2: 97% 97% 97% 98%  Weight:      Height:        General - Elderly ill looking but can American obese female, no apparent distress HEENT - PERRLA, EOMI, atraumatic head, non tender sinuses. Lung -distant breath sounds, bibasilar rales, diffuse rhonchi, no wheezes. Heart - S1, S2 heard, no murmurs, rubs, chronic 1+ pitting pedal edema. Abdomen - Soft, non tender, obese, PEG tube intact Neuro - Alert, awake and oriented, non focal exam. Skin - Warm and dry, left AKA  Data Reviewed:      Latest Ref Rng & Units 03/03/2023    4:15 AM 03/02/2023    4:41 AM 03/01/2023    2:55 AM  CBC  WBC 4.0 - 10.5 K/uL 17.4  28.6  42.8   Hemoglobin 12.0 - 15.0 g/dL 9.4  28.4  13.2   Hematocrit 36.0 - 46.0 % 29.7  34.8  41.6   Platelets 150 - 400 K/uL 296  359  413       Latest Ref Rng & Units 03/03/2023    4:15 AM 03/02/2023    4:40 AM 03/01/2023    2:55 AM  BMP  Glucose 70 - 99 mg/dL 440  102  725   BUN 8 - 23 mg/dL 34  76  49   Creatinine 0.44 - 1.00 mg/dL 3.66  4.40  3.47   Sodium 135 - 145 mmol/L 131  137  134   Potassium 3.5 - 5.1 mmol/L 2.8  4.5  4.9   Chloride 98 - 111 mmol/L 97  103  101   CO2 22 - 32 mmol/L 27  20  20    Calcium 8.9 - 10.3 mg/dL 8.3  9.9  9.3    No results found.   Family Communication: Discussed with family, they understand and agree. All questions answereed.  Disposition: Status is: Inpatient Remains inpatient appropriate because: Sepsis, IV antibiotics, hemodialysis  Planned Discharge Destination: Home with Home Health and Skilled nursing facility     MDM level 3-patient is critically ill due to septic shock, required vasopressor support in ICU.  She has elevated white count, on empiric antibiotic therapy.  She is ESRD on hemodialysis recent MRSA infection.  She is at high risk for sudden clinical deterioration.  Author: Marcelino Duster, MD 03/03/2023 1:29 PM Secure  chat 7am to 7pm For on call review [email or web address].

## 2023-03-03 NOTE — Progress Notes (Signed)
[facility]  PROGRESS NOTE   Subjective:   Patient is known to our practice from previous admissions.  She was recently discharged on November 8.  She is brought back to the hospital by family due to concerns of coughing, difficulty breathing, concern of infection around the G-tube.      Update:  Patient due for hemodialysis treatment today.  Still having relative hypotension. Case discussed with nursing. We agreed to start the patient on Levophed during dialysis to maintain a MAP of 65.  Objective:  Vital signs: Blood pressure 131/67, pulse 64, temperature 98.6 F (37 C), temperature source Axillary, resp. rate 16, height 5\' 4"  (1.626 m), weight 86.3 kg, SpO2 97%.  Intake/Output Summary (Last 24 hours) at 03/03/2023 1952 Last data filed at 03/03/2023 1930 Gross per 24 hour  Intake 1612.62 ml  Output 1000 ml  Net 612.62 ml    Filed Weights   03/01/23 0045 03/03/23 0500 03/03/23 1724  Weight: 85.5 kg 86.3 kg 86.3 kg      Physical Exam: General: No acute distress  Head: Normocephalic, atraumatic. Moist oral mucosal membranes  Eyes: Anicteric  Lungs:  normal effort, coarse breath sounds  Heart: S1S2 no rubs  Abdomen:  Soft, nontender, bowel sounds present, G-tube in place  Extremities: 1+ lower extremity edema  Neurologic: Lethargic but arousable  Skin: No lesions  Access: Rt chest permcath/Dr. Dew/02/16/2023    Basic Metabolic Panel: Recent Labs  Lab 02/28/23 0621 03/01/23 0255 03/02/23 0440 03/02/23 0441 03/03/23 0415 03/03/23 1700  NA 134* 134* 137  --  131* 130*  K 4.0 4.9 4.5  --  2.8* 4.0  CL 100 101 103  --  97* 98  CO2 21* 20* 20*  --  27 26  GLUCOSE 137* 158* 152*  --  101* 111*  BUN 33* 49* 76*  --  34* 44*  CREATININE 3.07* 4.48* 5.57*  --  2.66* 3.20*  CALCIUM 9.4 9.3 9.9  --  8.3* 8.4*  MG  --   --   --  2.5* 1.8  --   PHOS  --   --  3.7  --  1.8* 2.5   GFR: Estimated Creatinine Clearance: 16.4 mL/min (A) (by C-G formula  based on SCr of 3.2 mg/dL (H)).  Liver Function Tests: Recent Labs  Lab 02/27/23 1449 03/01/23 0255 03/02/23 0440 03/03/23 1700  AST 59* 91*  --   --   ALT 74* 92*  --   --   ALKPHOS 157* 102  --   --   BILITOT 0.5 0.9  --   --   PROT 7.3 6.4*  --   --   ALBUMIN 3.0* 2.5* 2.3* 2.4*   No results for input(s): "LIPASE", "AMYLASE" in the last 168 hours. No results for input(s): "AMMONIA" in the last 168 hours.   CBC: Recent Labs  Lab 02/27/23 1449 02/28/23 0621 03/01/23 0255 03/02/23 0441 03/03/23 0415 03/03/23 1700  WBC 15.7* 32.3* 42.8* 28.6* 17.4* 15.1*  NEUTROABS 8.8*  --   --   --   --   --   HGB 12.4 14.1 13.0 11.0* 9.4* 10.1*  HCT 39.8 45.9 41.6 34.8* 29.7* 31.1*  MCV 82.4 83.6 81.4 79.3* 80.9 79.9*  PLT 388 367 413* 359 296 327     HbA1C: Hgb A1c MFr Bld  Date/Time Value Ref Range Status  02/11/2023 02:55 PM 4.7 <5.7 % of total Hgb Final    Comment:    For the purpose of  screening for the presence of diabetes: . <5.7%       Consistent with the absence of diabetes 5.7-6.4%    Consistent with increased risk for diabetes             (prediabetes) > or =6.5%  Consistent with diabetes . This assay result is consistent with a decreased risk of diabetes. . Currently, no consensus exists regarding use of hemoglobin A1c for diagnosis of diabetes in children. . According to American Diabetes Association (ADA) guidelines, hemoglobin A1c <7.0% represents optimal control in non-pregnant diabetic patients. Different metrics may apply to specific patient populations.  Standards of Medical Care in Diabetes(ADA). [name]   12/07/2022 02:13 AM 5.2 4.8 - 5.6 % Final    Comment:    (NOTE) Pre diabetes:          5.7%-6.4%  Diabetes:              >6.4%  Glycemic control for   <7.0% adults with diabetes     Urinalysis: No results for input(s): "COLORURINE", "LABSPEC", "PHURINE", "GLUCOSEU", "HGBUR", "BILIRUBINUR", "KETONESUR", "PROTEINUR", "UROBILINOGEN",  "NITRITE", "LEUKOCYTESUR" in the last 72 hours.  Invalid input(s): "APPERANCEUR"     Imaging: No results found.   Medications:    norepinephrine (LEVOPHED) Adult infusion 5 mcg/min (03/03/23 1824)   piperacillin-tazobactam (ZOSYN)  IV Stopped (03/03/23 1230)    ascorbic acid  500 mg Per Tube BID   atorvastatin  20 mg Per Tube Daily   vitamin D3  2,000 Units Per Tube Daily   cholestyramine  4 g Per Tube TID   escitalopram  10 mg Per Tube Daily   famotidine  10 mg Per Tube Daily   feeding supplement (NEPRO CARB STEADY)  1,000 mL Per Tube Q24H   feeding supplement (PROSource TF20)  60 mL Per Tube Daily   fludrocortisone  0.2 mg Per Tube Daily   free water  30 mL Per Tube Q4H   heparin  5,000 Units Subcutaneous Q8H   hydrOXYzine  100 mg Per Tube BID   insulin aspart  1-3 Units Subcutaneous Q4H   metoCLOPramide (REGLAN) injection  10 mg Intravenous Q8H   midodrine  20 mg Per Tube TID WC   multivitamin  1 tablet Per Tube QHS   nutrition supplement (JUVEN)  1 packet Per Tube BID BM   nystatin   Topical TID   potassium & sodium phosphates  1 packet Per Tube Q4H   sodium chloride flush  10-40 mL Intracatheter Q12H    Assessment/ Plan:     72 year old female with history of hypertension, coronary artery disease, congestive heart failure, COPD, cirrhosis, diabetes, status post AKA, status post G-tube placement.   Patient is admitted for: Dyspnea on exertion [R06.09] Dyspnea and respiratory abnormalities [R06.00, R06.89] Dyspnea, unspecified type [R06.00]  Outpatient dialysis: [facility]/TTS/[facility] [address]/right IJ PermCath  #1: End-stage renal disease:  Patient due for hemodialysis treatment today.  We are providing Levophed for use during dialysis treatment to maintain a MAP of 65 or greater.   #2:  Chronic hypotension:   Patient has history of chronic hypotension from adrenal insufficiency. Requires midodrine 20 mg 3 times per day. Levophed was provided during dialysis  treatment.   #3:  Sepsis due to recurrent  aspiration Patient currently on Zosyn.  #4: Anemia of chronic kidney.   Lab Results  Component Value Date   HGB 10.1 (L) 03/03/2023   Hemoglobin acceptable at 10.1.  Hold off on Epogen at this time.  LOS: 4 Kalel Harty [facility] 11/19/20247:52 PM

## 2023-03-03 NOTE — Progress Notes (Signed)
Per MD-Sreeram CAll Doctor if MAP is less then 55 or systolic less then 80.  MD to place order  Continue to assess.

## 2023-03-03 NOTE — Plan of Care (Signed)
Patient alert with intermittent drowsiness with confusion. No complaints of pain or shortness of breath. Tolerating tube feeds. Patient did have a few episodes of diarrhea, rectal tube ordered. Replaced patients electrolytes per orders. Patient started on levophed to maintain MAP of 65 during dialysis. Once completed MAP goal is 55 or greater. Continue to assess.

## 2023-03-03 NOTE — Discharge Planning (Signed)
ESTABLISHED HEMPDIALYSIS  Outpatient Facility  [facility]  [address]  [place] [address] [phone]  Schedule: TTS 11:20AM  Confirmed patient is active and can resume schedule upon discharge.  Dimas Chyle Dialysis Coordinator II  [facility] Cell: [phone] eFax: [phone] [email or web address]

## 2023-03-04 ENCOUNTER — Inpatient Hospital Stay: Payer: Medicare Other | Admitting: Radiology

## 2023-03-04 ENCOUNTER — Other Ambulatory Visit: Payer: Medicare Other

## 2023-03-04 DIAGNOSIS — E1122 Type 2 diabetes mellitus with diabetic chronic kidney disease: Secondary | ICD-10-CM

## 2023-03-04 DIAGNOSIS — Z794 Long term (current) use of insulin: Secondary | ICD-10-CM

## 2023-03-04 DIAGNOSIS — N186 End stage renal disease: Secondary | ICD-10-CM | POA: Diagnosis not present

## 2023-03-04 DIAGNOSIS — R0609 Other forms of dyspnea: Secondary | ICD-10-CM | POA: Diagnosis not present

## 2023-03-04 DIAGNOSIS — E2740 Unspecified adrenocortical insufficiency: Secondary | ICD-10-CM | POA: Diagnosis not present

## 2023-03-04 HISTORY — PX: IR RADIOLOGIST EVAL & MGMT: IMG5224

## 2023-03-04 LAB — CBC
HCT: 30.5 % — ABNORMAL LOW (ref 36.0–46.0)
Hemoglobin: 9.8 g/dL — ABNORMAL LOW (ref 12.0–15.0)
MCH: 25.1 pg — ABNORMAL LOW (ref 26.0–34.0)
MCHC: 32.1 g/dL (ref 30.0–36.0)
MCV: 78.2 fL — ABNORMAL LOW (ref 80.0–100.0)
Platelets: 281 10*3/uL (ref 150–400)
RBC: 3.9 MIL/uL (ref 3.87–5.11)
RDW: 15.7 % — ABNORMAL HIGH (ref 11.5–15.5)
WBC: 12.5 10*3/uL — ABNORMAL HIGH (ref 4.0–10.5)
nRBC: 0.3 % — ABNORMAL HIGH (ref 0.0–0.2)

## 2023-03-04 LAB — BASIC METABOLIC PANEL
Anion gap: 7 (ref 5–15)
BUN: 31 mg/dL — ABNORMAL HIGH (ref 8–23)
CO2: 24 mmol/L (ref 22–32)
Calcium: 8.7 mg/dL — ABNORMAL LOW (ref 8.9–10.3)
Chloride: 102 mmol/L (ref 98–111)
Creatinine, Ser: 2.57 mg/dL — ABNORMAL HIGH (ref 0.44–1.00)
GFR, Estimated: 19 mL/min — ABNORMAL LOW (ref >60)
Glucose, Bld: 97 mg/dL (ref 70–99)
Potassium: 3.6 mmol/L (ref 3.5–5.1)
Sodium: 133 mmol/L — ABNORMAL LOW (ref 135–145)

## 2023-03-04 LAB — GLUCOSE, CAPILLARY
Glucose-Capillary: 100 mg/dL — ABNORMAL HIGH (ref 70–99)
Glucose-Capillary: 90 mg/dL (ref 70–99)
Glucose-Capillary: 92 mg/dL (ref 70–99)
Glucose-Capillary: 94 mg/dL (ref 70–99)
Glucose-Capillary: 85 mg/dL (ref 70–99)

## 2023-03-04 LAB — PROCALCITONIN: Procalcitonin: 6.65 ng/mL

## 2023-03-04 LAB — C-REACTIVE PROTEIN: CRP: 2 mg/dL — ABNORMAL HIGH (ref <1.0)

## 2023-03-04 LAB — CULTURE, BLOOD (ROUTINE X 2)
Culture: NO GROWTH
Special Requests: ADEQUATE

## 2023-03-04 LAB — MAGNESIUM: Magnesium: 2.2 mg/dL (ref 1.7–2.4)

## 2023-03-04 LAB — VANCOMYCIN, RANDOM: Vancomycin Rm: 27 ug/mL

## 2023-03-04 LAB — PHOSPHORUS: Phosphorus: 2.3 mg/dL — ABNORMAL LOW (ref 2.5–4.6)

## 2023-03-04 LAB — LACTIC ACID, PLASMA: Lactic Acid, Venous: 1.7 mmol/L (ref 0.5–1.9)

## 2023-03-04 MED ORDER — MORPHINE SULFATE (PF) 2 MG/ML IV SOLN: 2.0000 mg | INTRAVENOUS | Status: DC | PRN

## 2023-03-04 MED ORDER — STERILE WATER FOR INJECTION IJ SOLN
INTRAMUSCULAR | Status: AC
  Filled 2023-03-04: qty 20

## 2023-03-04 MED ORDER — K PHOS MONO-SOD PHOS DI & MONO 155-852-130 MG PO TABS
500.0000 mg | ORAL_TABLET | Freq: Once | ORAL | Status: AC
  Administered 2023-03-04: 500 mg
  Filled 2023-03-04: qty 2

## 2023-03-04 MED ORDER — TRAMADOL HCL 50 MG PO TABS
50.0000 mg | ORAL_TABLET | Freq: Two times a day (BID) | ORAL | Status: DC | PRN
  Administered 2023-03-04: 50 mg via ORAL
  Filled 2023-03-04 (×2): qty 1

## 2023-03-04 NOTE — Plan of Care (Signed)
  Problem: Fluid Volume: Goal: Hemodynamic stability will improve Outcome: Progressing   Problem: Clinical Measurements: Goal: Diagnostic test results will improve Outcome: Progressing Goal: Signs and symptoms of infection will decrease Outcome: Progressing   Problem: Clinical Measurements: Goal: Will remain free from infection Outcome: Progressing Goal: Cardiovascular complication will be avoided Outcome: Progressing

## 2023-03-04 NOTE — Progress Notes (Signed)
[facility]  PROGRESS NOTE   Subjective:   Patient is known to our practice from previous admissions.  She was recently discharged on November 8.  She is brought back to the hospital by family due to concerns of coughing, difficulty breathing, concern of infection around the G-tube.      Update:  Patient underwent hemodialysis treatment yesterday. UF achieved was 1 kg. Patient awake this a.m. but confused.  Objective:  Vital signs: Blood pressure (!) 91/55, pulse 66, temperature 98.3 F (36.8 C), temperature source Axillary, resp. rate 13, height 5\' 4"  (1.626 m), weight 83.4 kg, SpO2 98%.  Intake/Output Summary (Last 24 hours) at 03/04/2023 0817 Last data filed at 03/04/2023 0700 Gross per 24 hour  Intake 1712.43 ml  Output 1150 ml  Net 562.43 ml    Filed Weights   03/03/23 0500 03/03/23 1724 03/04/23 0500  Weight: 86.3 kg 86.3 kg 83.4 kg      Physical Exam: General: No acute distress  Head: Normocephalic, atraumatic. Moist oral mucosal membranes  Eyes: Anicteric  Lungs:  normal effort, coarse breath sounds  Heart: S1S2 no rubs  Abdomen:  Soft, nontender, bowel sounds present, G-tube in place  Extremities: 1+ lower extremity edema  Neurologic: Awake but confused  Skin: No lesions  Access: Rt chest permcath/Dr. Dew/02/16/2023    Basic Metabolic Panel: Recent Labs  Lab 03/01/23 0255 03/02/23 0440 03/02/23 0441 03/03/23 0415 03/03/23 1700 03/04/23 0434  NA 134* 137  --  131* 130* 133*  K 4.9 4.5  --  2.8* 4.0 3.6  CL 101 103  --  97* 98 102  CO2 20* 20*  --  27 26 24   GLUCOSE 158* 152*  --  101* 111* 97  BUN 49* 76*  --  34* 44* 31*  CREATININE 4.48* 5.57*  --  2.66* 3.20* 2.57*  CALCIUM 9.3 9.9  --  8.3* 8.4* 8.7*  MG  --   --  2.5* 1.8  --  2.2  PHOS  --  3.7  --  1.8* 2.5 2.3*   GFR: Estimated Creatinine Clearance: 20.1 mL/min (A) (by C-G formula based on SCr of 2.57 mg/dL (H)).  Liver Function Tests: Recent Labs  Lab  02/27/23 1449 03/01/23 0255 03/02/23 0440 03/03/23 1700  AST 59* 91*  --   --   ALT 74* 92*  --   --   ALKPHOS 157* 102  --   --   BILITOT 0.5 0.9  --   --   PROT 7.3 6.4*  --   --   ALBUMIN 3.0* 2.5* 2.3* 2.4*   No results for input(s): "LIPASE", "AMYLASE" in the last 168 hours. No results for input(s): "AMMONIA" in the last 168 hours.   CBC: Recent Labs  Lab 02/27/23 1449 02/28/23 0621 03/01/23 0255 03/02/23 0441 03/03/23 0415 03/03/23 1700 03/04/23 0434  WBC 15.7*   < > 42.8* 28.6* 17.4* 15.1* 12.5*  NEUTROABS 8.8*  --   --   --   --   --   --   HGB 12.4   < > 13.0 11.0* 9.4* 10.1* 9.8*  HCT 39.8   < > 41.6 34.8* 29.7* 31.1* 30.5*  MCV 82.4   < > 81.4 79.3* 80.9 79.9* 78.2*  PLT 388   < > 413* 359 296 327 281   < > = values in this interval not displayed.     HbA1C: Hgb A1c MFr Bld  Date/Time Value Ref Range Status  02/11/2023 02:55 PM  4.7 <5.7 % of total Hgb Final    Comment:    For the purpose of screening for the presence of diabetes: . <5.7%       Consistent with the absence of diabetes 5.7-6.4%    Consistent with increased risk for diabetes             (prediabetes) > or =6.5%  Consistent with diabetes . This assay result is consistent with a decreased risk of diabetes. . Currently, no consensus exists regarding use of hemoglobin A1c for diagnosis of diabetes in children. . According to American Diabetes Association (ADA) guidelines, hemoglobin A1c <7.0% represents optimal control in non-pregnant diabetic patients. Different metrics may apply to specific patient populations.  Standards of Medical Care in Diabetes(ADA). [name]   12/07/2022 02:13 AM 5.2 4.8 - 5.6 % Final    Comment:    (NOTE) Pre diabetes:          5.7%-6.4%  Diabetes:              >6.4%  Glycemic control for   <7.0% adults with diabetes     Urinalysis: No results for input(s): "COLORURINE", "LABSPEC", "PHURINE", "GLUCOSEU", "HGBUR", "BILIRUBINUR", "KETONESUR", "PROTEINUR",  "UROBILINOGEN", "NITRITE", "LEUKOCYTESUR" in the last 72 hours.  Invalid input(s): "APPERANCEUR"     Imaging: No results found.   Medications:    norepinephrine (LEVOPHED) Adult infusion Stopped (03/03/23 2100)   piperacillin-tazobactam (ZOSYN)  IV Stopped (03/04/23 0425)    ascorbic acid  500 mg Per Tube BID   atorvastatin  20 mg Per Tube Daily   vitamin D3  2,000 Units Per Tube Daily   cholestyramine  4 g Per Tube TID   escitalopram  10 mg Per Tube Daily   famotidine  10 mg Per Tube Daily   feeding supplement (NEPRO CARB STEADY)  1,000 mL Per Tube Q24H   feeding supplement (PROSource TF20)  60 mL Per Tube Daily   fludrocortisone  0.2 mg Per Tube Daily   free water  30 mL Per Tube Q4H   heparin  5,000 Units Subcutaneous Q8H   hydrOXYzine  100 mg Per Tube BID   insulin aspart  1-3 Units Subcutaneous Q4H   metoCLOPramide (REGLAN) injection  10 mg Intravenous Q8H   midodrine  20 mg Per Tube TID WC   multivitamin  1 tablet Per Tube QHS   nutrition supplement (JUVEN)  1 packet Per Tube BID BM   nystatin   Topical TID   phosphorus  500 mg Per Tube Once   sodium chloride flush  10-40 mL Intracatheter Q12H    Assessment/ Plan:     72 year old female with history of hypertension, coronary artery disease, congestive heart failure, COPD, cirrhosis, diabetes, status post AKA, status post G-tube placement.   Patient is admitted for: Dyspnea on exertion [R06.09] Dyspnea and respiratory abnormalities [R06.00, R06.89] Dyspnea, unspecified type [R06.00]  Outpatient dialysis: [facility]/TTS/[facility] [address]/right IJ PermCath  #1: End-stage renal disease:  Patient underwent hemodialysis treatment yesterday.  Tolerated well.  UF achieved was 1 kg.  Next Allises treatment scheduled for tomorrow.   #2:  Chronic hypotension:   Patient has history of chronic hypotension from adrenal insufficiency. Requires midodrine 20 mg 3 times per day.    #3:  Sepsis due to recurrent  aspiration Continue  Zosyn as per hospitalist.  #4: Anemia of chronic kidney.   Lab Results  Component Value Date   HGB 9.8 (L) 03/04/2023   Hemoglobin slightly lower today at 9.8.  Continue to  monitor CBC.  Hold off on Epogen for now.     LOS: 5 Xayvion Shirah [facility] 11/20/20248:17 AM

## 2023-03-04 NOTE — Progress Notes (Signed)
Progress Note   Patient: Alexandra Foster ZOX:[id number] DOB: 05/14/1950 DOA: 02/27/2023     5 DOS: the patient was seen and examined on 03/04/2023   Brief hospital course: 72 y.o with significant PMH of Asthma, COPD, type diabetes mellitus, s/p G-tube, GERD, ESRD on HD(TuThSa), hypertension, osteoarthritis, paroxysmal atrial fibrillation and OSA on CPAP who was sent to the ED from her PCP's office for evaluation of possible sepsis. Patient had multiple prior admissions, recent septic shock due to MRSA bacteremia from infected PermCath, Enterococcus UTI.  Patient got vancomycin therapy for 10 days after new PermCath with insertion.   Patient was initially admitted to hospitalist service with impression of possible sepsis, started on broad-spectrum antibiotics cefepime, vancomycin.  On 03/01/2023 patient became hypotensive during hemodialysis, ICU team consulted who started him on vasopressor, stress to steroids, midodrine.  Patient's overall prognosis is poor, ICU attending discussed with patient's family regarding goals of care, she has made DNI but family wants CPR to be done.  Patient's blood pressure is better, tolerated hemodialysis transferred to [facility] service.  11/20 - Palliative care requested for f/up for GOC, transfer to floors. IR assessment for PEG clogging  Assessment and Plan: Septic shock Possible aspiration pneumonia Chronic infected decubitus ulcers Recent history of MRSA bacteremia status post removal of cath 8/24. Stop vanco and continue Zosyn for now Patient's family declined TEE given high risk of respiratory failure due to sedative medications during procedure. Patient's white count is improving, will trend Lactic acid, procal.  Chronic hypotension. Off pressors currently. Patient does have history of adrenal insufficiency. Continue midodrine 20 mg 3 times per day. Continue Florinef therapy.  ESRD on HD Continue hemodialysis per nephrology team.  Diarrhea- Seems  chronic.  Hyponatremia Hypokalemia Hypophosphatemia Electrolyte abnormalities after hemodialysis. Pharmacy consult ordered for electrolyte management.  Anemia of Chronic disease- Hemoglobin around 10. No active bleeding at this time. Continue to monitor H&H transfuse if hemoglobin less than 7. Continue erythropoietin as per nephrology.  Paroxysmal atrial fibrillation- Patient's home dose amiodarone therapy on hold due to bradycardia. Outpatient cardiology follow-up.  COPD OSA on CPAP No exacerbation of COPD. Continue current nebulizer treatments.  Anxiety and depression: Patient will be continued on home dose Lexapro, Atarax, Klonopin therapy.  History of left AKA Pressure injury of skin Dysphagia status post G-tube in place- G tube malfunction/clogging Continue tube feeds as per dietitian recommendations. IR evaluated the G Tube on 11/20 - both ports are flushing now.  Obesity BMI 32.66 Nutrition Documentation    Flowsheet Row ED to Hosp-Admission (Current) from [facility]/27/2023 in [facility] ICU/CCU  Nutrition Problem Inadequate oral intake  Etiology dysphagia  Nutrition Goal Patient will meet greater than or equal to 90% of their needs  Interventions Tube feeding      Out of bed to chair. Incentive spirometry if able to  Fall, aspiration precautions. DVT prophylaxis- Heparin sq   Code Status: Prior  Subjective: slow voice but no new issues.  Physical Exam: Vitals:   03/04/23 1100 03/04/23 1200 03/04/23 1344 03/04/23 1548  BP: (!) 110/53 (!) 115/49 (!) 110/54 (!) 102/56  Pulse: 64 66 71 68  Resp:  19 14 14   Temp:  98.5 F (36.9 C) 98.5 F (36.9 C) 98.5 F (36.9 C)  TempSrc:  Tympanic Oral Oral  SpO2: 94% 96% 100% 100%  Weight:      Height:        General - Elderly ill looking but can American obese female, no apparent distress HEENT -  PERRLA, EOMI, atraumatic head, non tender sinuses. Lung -distant breath sounds, bibasilar rales,  diffuse rhonchi, no wheezes. Heart - S1, S2 heard, no murmurs, rubs, chronic 1+ pitting pedal edema. Abdomen - Soft, non tender, obese, PEG tube intact Neuro - Alert, awake and oriented, non focal exam. Skin - Warm and dry, left AKA  Data Reviewed:      Latest Ref Rng & Units 03/04/2023    4:34 AM 03/03/2023    5:00 PM 03/03/2023    4:15 AM  CBC  WBC 4.0 - 10.5 K/uL 12.5  15.1  17.4   Hemoglobin 12.0 - 15.0 g/dL 9.8  52.8  9.4   Hematocrit 36.0 - 46.0 % 30.5  31.1  29.7   Platelets 150 - 400 K/uL 281  327  296       Latest Ref Rng & Units 03/04/2023    4:34 AM 03/03/2023    5:00 PM 03/03/2023    4:15 AM  BMP  Glucose 70 - 99 mg/dL 97  413  244   BUN 8 - 23 mg/dL 31  44  34   Creatinine 0.44 - 1.00 mg/dL 0.10  2.72  5.36   Sodium 135 - 145 mmol/L 133  130  131   Potassium 3.5 - 5.1 mmol/L 3.6  4.0  2.8   Chloride 98 - 111 mmol/L 102  98  97   CO2 22 - 32 mmol/L 24  26  27    Calcium 8.9 - 10.3 mg/dL 8.7  8.4  8.3    IR Radiologist Eval & Mgmt  Result Date: 03/04/2023 EXAM: NEW PATIENT OFFICE VISIT CHIEF COMPLAINT: See epic note HISTORY OF PRESENT ILLNESS: See epic note REVIEW OF SYSTEMS: See epic note PHYSICAL EXAMINATION: See epic note ASSESSMENT AND PLAN: See epic note Electronically Signed   By: Olive Bass M.D.   On: 03/04/2023 15:15     Family Communication: None at bedside  Disposition: Status is: Inpatient Remains inpatient appropriate because: Sepsis, IV antibiotics, hemodialysis  Planned Discharge Destination: Home with Home Health and Skilled nursing facility     MDM level 3-patient is on empiric antibiotic therapy.  She is ESRD on hemodialysis recent MRSA infection.  She is at high risk for sudden clinical deterioration.   Time spent: 35 mins  Author: Delfino Lovett, MD 03/04/2023 9:03 PM Secure chat 7am to 7pm For on call review [email or web address].

## 2023-03-04 NOTE — Progress Notes (Signed)
Telephone report called to [facility] Dunkley,RN. Patient to be transported via bed by this nurse.

## 2023-03-04 NOTE — Progress Notes (Signed)
PHARMACY CONSULT NOTE  Pharmacy Consult for Electrolyte Monitoring and Replacement   Recent Labs: Potassium (mmol/L)  Date Value  03/04/2023 3.6  08/11/2013 3.3 (L)   Magnesium (mg/dL)  Date Value  40/98/1191 2.2   Calcium (mg/dL)  Date Value  47/82/9562 8.7 (L)   Calcium, Total (mg/dL)  Date Value  13/11/6576 9.2   Albumin (g/dL)  Date Value  46/96/2952 2.4 (L)  08/11/2013 3.1 (L)   Phosphorus (mg/dL)  Date Value  84/13/2440 2.3 (L)   Sodium (mmol/L)  Date Value  03/04/2023 133 (L)  08/11/2013 138   Assessment: 72 y.o. 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   Goal of Therapy:  Electrolytes WNL  Plan:  --Phos 2.3, K Phos Neutral 500 mg per tube x 1 dose --Re-check electrolytes in AM  Tressie Ellis 03/04/2023 7:53 AM

## 2023-03-04 NOTE — Progress Notes (Signed)
IR asked to evaluate patient's g-tube which is clogged per bedside RN. Records show IR last exchanged the g-tube 06/12/21 to a 20 Fr balloon retention.   Bedside assessment shows an 18 Fr balloon retention that does not appear to be one placed by [facility]. There are two ports and both are easily flushed. Kangaroo pump re-started and there were no issues with tube feeds infusing.   Bedside nursing made aware.   Alwyn Ren, [place] [phone] 03/04/2023, 12:43 PM

## 2023-03-04 NOTE — Plan of Care (Signed)
PMT contacted by primary team to address code status. Up to see patient. She is currently changing beds and being moved out of ICU to another unit. Will follow up at another time.

## 2023-03-04 NOTE — Patient Outreach (Addendum)
  Care Management  Transitions of Care Program Transitions of Care Post-discharge week 2  03/04/2023 Name: Alexandra Foster MRN: [id number] DOB: 1950-10-31  Subjective: Alexandra Foster is a 72 y.o. year old female who is a primary care patient of [name], MD. The Care Management team was unable to reach the patient by phone to assess and address transitions of care needs.  On record review Patient , was seen in PCP office for SOB, there was concern for possible sepsis , she was sent to ER and was admitted to ICU 02/27/23 Dyspnea on exertion Will close this TOC engagement and follow-up when she is discharged   Plan: No Additional outreach attempts will be made to reach the patient at this time  Susa Loffler , BSN, RN Care Management Coordinator [facility]   [facility] [email or web address] Direct Dial: [phone]

## 2023-03-04 NOTE — Plan of Care (Signed)
  Problem: Education: Goal: Knowledge of General Education information will improve Description: Including pain rating scale, medication(s)/side effects and non-pharmacologic comfort measures Outcome: Progressing   Problem: Safety: Goal: Ability to remain free from injury will improve Outcome: Progressing   

## 2023-03-05 DIAGNOSIS — Z7189 Other specified counseling: Secondary | ICD-10-CM

## 2023-03-05 DIAGNOSIS — E1122 Type 2 diabetes mellitus with diabetic chronic kidney disease: Secondary | ICD-10-CM | POA: Diagnosis not present

## 2023-03-05 DIAGNOSIS — R0609 Other forms of dyspnea: Secondary | ICD-10-CM | POA: Diagnosis not present

## 2023-03-05 DIAGNOSIS — Z89612 Acquired absence of left leg above knee: Secondary | ICD-10-CM

## 2023-03-05 DIAGNOSIS — F32A Depression, unspecified: Secondary | ICD-10-CM

## 2023-03-05 DIAGNOSIS — Z8739 Personal history of other diseases of the musculoskeletal system and connective tissue: Secondary | ICD-10-CM | POA: Diagnosis not present

## 2023-03-05 DIAGNOSIS — F419 Anxiety disorder, unspecified: Secondary | ICD-10-CM

## 2023-03-05 LAB — CBC
HCT: 28.8 % — ABNORMAL LOW (ref 36.0–46.0)
Hemoglobin: 9.5 g/dL — ABNORMAL LOW (ref 12.0–15.0)
MCH: 26.2 pg (ref 26.0–34.0)
MCHC: 33 g/dL (ref 30.0–36.0)
MCV: 79.6 fL — ABNORMAL LOW (ref 80.0–100.0)
Platelets: 287 10*3/uL (ref 150–400)
RBC: 3.62 MIL/uL — ABNORMAL LOW (ref 3.87–5.11)
RDW: 15.4 % (ref 11.5–15.5)
WBC: 13.2 10*3/uL — ABNORMAL HIGH (ref 4.0–10.5)
nRBC: 0.2 % (ref 0.0–0.2)

## 2023-03-05 LAB — BASIC METABOLIC PANEL
Anion gap: 10 (ref 5–15)
BUN: 52 mg/dL — ABNORMAL HIGH (ref 8–23)
CO2: 25 mmol/L (ref 22–32)
Calcium: 9.1 mg/dL (ref 8.9–10.3)
Chloride: 99 mmol/L (ref 98–111)
Creatinine, Ser: 3.59 mg/dL — ABNORMAL HIGH (ref 0.44–1.00)
GFR, Estimated: 13 mL/min — ABNORMAL LOW (ref >60)
Glucose, Bld: 99 mg/dL (ref 70–99)
Potassium: 3.7 mmol/L (ref 3.5–5.1)
Sodium: 134 mmol/L — ABNORMAL LOW (ref 135–145)

## 2023-03-05 LAB — GLUCOSE, CAPILLARY
Glucose-Capillary: 106 mg/dL — ABNORMAL HIGH (ref 70–99)
Glucose-Capillary: 100 mg/dL — ABNORMAL HIGH (ref 70–99)
Glucose-Capillary: 113 mg/dL — ABNORMAL HIGH (ref 70–99)
Glucose-Capillary: 88 mg/dL (ref 70–99)

## 2023-03-05 LAB — MAGNESIUM: Magnesium: 2.4 mg/dL (ref 1.7–2.4)

## 2023-03-05 LAB — CULTURE, BLOOD (ROUTINE X 2): Culture: NO GROWTH

## 2023-03-05 LAB — PHOSPHORUS: Phosphorus: 3.5 mg/dL (ref 2.5–4.6)

## 2023-03-05 MED ORDER — ALBUMIN HUMAN 25 % IV SOLN
25.0000 g | Freq: Once | INTRAVENOUS | Status: AC
  Administered 2023-03-05: 25 g via INTRAVENOUS
  Filled 2023-03-05: qty 100

## 2023-03-05 MED ORDER — MORPHINE SULFATE (PF) 2 MG/ML IV SOLN: 1.0000 mg | INTRAVENOUS | Status: DC | PRN

## 2023-03-05 MED ORDER — ALBUMIN HUMAN 25 % IV SOLN
INTRAVENOUS | Status: AC
  Filled 2023-03-05: qty 100

## 2023-03-05 MED ORDER — MUPIROCIN 2 % EX OINT
1.0000 | TOPICAL_OINTMENT | Freq: Two times a day (BID) | CUTANEOUS | Status: DC
  Administered 2023-03-05 – 2023-03-08 (×7): 1 via NASAL
  Filled 2023-03-05: qty 22

## 2023-03-05 NOTE — Progress Notes (Signed)
Nutrition Follow-up  DOCUMENTATION CODES:   Obesity unspecified  INTERVENTION:   -Continue TF via g-tube:   Nepro @ 45 ml/hr   60 ml Prosource TF daily  30 ml free water flush every 4 hours  Tube feeding regimen provides 2024 kcal (100% of needs), 107 grams of protein, and 785 ml of H2O. Total free watrer: 965 ml daily  -Continue renal MVI daily via tube -Continue 1 packet Juven BID via tube, each packet provides 95 calories, 2.5 grams of protein (collagen), and 9.8 grams of carbohydrate (3 grams sugar); also contains 7 grams of L-arginine and L-glutamine, 300 mg vitamin C, 15 mg vitamin E, 1.2 mcg vitamin B-12, 9.5 mg zinc, 200 mg calcium, and 1.5 g  Calcium Beta-hydroxy-Beta-methylbutyrate to support wound healing   NUTRITION DIAGNOSIS:   Inadequate oral intake related to dysphagia as evidenced by NPO status, other (comment) (pt with chronic G-tube).  Ongoing  GOAL:   Patient will meet greater than or equal to 90% of their needs  Met with TF  MONITOR:   Labs, Weight trends, TF tolerance, Skin, I & O's  REASON FOR ASSESSMENT:   Consult Enteral/tube feeding initiation and management  ASSESSMENT:   72 y.o. female with a history of asthma/COPD, HTN, DMII, obesity, OSA on CPAP, SDH, GERD, ESRD on HD, afib, anxiety, depression, possible cirrhosis and a lengthy admission (04/2021) for necrotizing fasciitis, bacteremia and sepsis (requiring L AKA, tracheostomy and G-tube placement) and recent admissions for MRSA & Staph epi bacteremia, UTI, toxic metabolic encephalopathy and septic shock who is admitted with recurrent sepsis.  11/20- IR evaluated g-tube- easily flushes and TF infusing without difficulty  Reviewed I/O's: +962 ml x 24 hours and +4.5 L since admission  Rectal tube output: 45 ml x 24 hours   Pt lying in bed at time of visit. She did not arouse to voice.   Pt is NPO and with PEG tube for sole source nutrition. Nepro infusing via PEG at goal rate of 45 ml/hr.  Pt tolerating TF well.   Palliative care following for goals of care. Per PCCM, notes, pt is DNR, but family would like CPR and chest compressions.   Medications reviewed and include vitamin C, vitamin D3, questran, lexapro, and reglan.  Labs reviewed: CBGS: 85-106 (inpatient orders for glycemic control are 1-3 units insulin aspart every 4 hours).    Diet Order:   Diet Order             Diet NPO time specified  Diet effective now                   EDUCATION NEEDS:   No education needs have been identified at this time  Skin:  Skin Assessment: Skin Integrity Issues: Skin Integrity Issues:: Incisions Stage II: buttock Incisions: IAD to bilateral broin, non-pressure wound to sacrum, degloving rt posterior thigh  Last BM:  03/05/23 (type 7 via rectal tube)  Height:   Ht Readings from Last 1 Encounters:  03/01/23 5\' 4"  (1.626 m)    Weight:   Wt Readings from Last 1 Encounters:  03/04/23 83.4 kg    Ideal Body Weight:  54.5 kg  BMI:  Body mass index is 31.56 kg/m.  Estimated Nutritional Needs:   Kcal:  1800-2000kcal/day  Protein:  90-105g/day  Fluid:  UOP +1L    Levada Schilling, RD, LDN, CDCES Registered Dietitian III Certified Diabetes Care and Education Specialist Please refer to AMION for RD and/or RD on-call/weekend/after hours pager

## 2023-03-05 NOTE — Progress Notes (Addendum)
Notified by Floor RN that patient is allergic to CHG. HD catheter Dressing was changed and gauze was used instead of biopatch. HD catheter dressing needs to be changed after each HD.

## 2023-03-05 NOTE — Procedures (Signed)
Dr. Cherylann Ratel ordered albumin 25g via secured EPIC chat due to low BP 83/50 (59).

## 2023-03-05 NOTE — Progress Notes (Signed)
Order to remove the CVC.  Requested to place PIV. Assessed BUE with ultrasound for PIV placement.  RUE with veins too small for PIV placement, CVR>75%.  LUE with swelling noted throughout extremity, noncompressible veins and small with CVR>60% noted.   Recommended doppler to r/o DVT via secure chat with Dr Sherryll Burger and Olu RN.  Doppler ordered.  Earley Abide, pts sister in law, at the bedside.  Educated Hilda on healthy vs non healthy veins, sizes of the veins needed and depth.  Earley Abide agrees no veins of suitable size as she was avidly watching the assessment and interactive with education.  Pt was as well, but dozed off easily.  D/w Earley Abide pros and cons of maintaining the CVC vs PIV.  Hilda in complete agreement that CVC should remain in place since pt still on IV ABT and verbalizes understanding of infection risks.  Earley Abide and Olu RN aware of need to change CVC dressing due to loose, but needs to locate alcohol swabs to replace CHG.

## 2023-03-05 NOTE — Progress Notes (Signed)
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 Patient had multiple prior admissions, recent septic shock due to MRSA bacteremia from infected PermCath, Enterococcus UTI.  Patient got vancomycin therapy for 10 days after new PermCath with insertion.   Patient was initially admitted to hospitalist service with impression of possible sepsis, started on broad-spectrum antibiotics cefepime, vancomycin.  On 03/01/2023 patient became hypotensive during hemodialysis, ICU team consulted who started him on vasopressor, stress to steroids, midodrine.  Patient's overall prognosis is poor, ICU attending discussed with patient's family regarding goals of care, she has made DNI but family wants CPR to be done.  Patient's blood pressure is better, tolerated hemodialysis transferred to [facility] service.  11/20 - Palliative care requested for f/up for GOC, transfer to floors. IR assessment for PEG clogging 11/21: Family is not ready to take her back and not quite open for goals of care conversation either  Assessment and Plan: Septic shock Possible aspiration pneumonia Chronic infected decubitus ulcers Recent history of MRSA bacteremia status post removal of cath 8/24. continue Zosyn for now Patient's family declined TEE given high risk of respiratory failure due to sedative medications during procedure. I have requested central line removal and obtain 2 peripheral IVs  Chronic hypotension. Patient does have history of adrenal insufficiency. Continue midodrine 20 mg 3 times per day. Continue Florinef therapy.  ESRD on  HD Continue hemodialysis per nephrology team.  Diarrhea- Seems chronic.  Hyponatremia Hypokalemia Hypophosphatemia Electrolyte abnormalities after hemodialysis. Pharmacy consult ordered for electrolyte management.  Anemia of Chronic disease- Hemoglobin around 10. No active bleeding at this time. Continue to monitor H&H transfuse if hemoglobin less than 7. Continue erythropoietin as per nephrology.  Paroxysmal atrial fibrillation- Patient's home dose amiodarone therapy on hold due to bradycardia. Outpatient cardiology follow-up.  COPD OSA on CPAP No exacerbation of COPD. Continue current nebulizer treatments.  Anxiety and depression: Continue Lexapro, Atarax, Klonopin therapy.  History of left AKA Pressure injury of skin Dysphagia status post G-tube in place- G tube malfunction/clogging Continue tube feeds as per dietitian recommendations. IR evaluated the G Tube on 11/20 - both ports are flushing now.  Obesity BMI 32.66 Nutrition Documentation    Flowsheet Row ED to Hosp-Admission (Current) from [facility]/27/2023 in [facility] ICU/CCU  Nutrition Problem Inadequate oral intake  Etiology dysphagia  Nutrition Goal Patient will meet greater than or equal to 90% of their needs  Interventions Tube feeding      Out of bed to chair. Incentive spirometry if able to  Fall, aspiration precautions. DVT prophylaxis- Heparin sq   Code Status: Prior  Subjective: slow voice but no new issues.  Physical Exam: Vitals:   03/04/23 1344 03/04/23 1548 03/04/23 2242 03/05/23 0749  BP: (!) 110/54 (!) 102/56 122/66 (!) 93/54  Pulse: 71 68 67 67  Resp: 14 14 20 16   Temp: 98.5 F (36.9 C) 98.5 F (36.9 C) 98 F (36.7 C)   TempSrc: Oral Oral    SpO2: 100% 100% 98% 100%  Weight:      Height:        General - Elderly ill looking but can  American obese female, no apparent distress HEENT - PERRLA, EOMI, atraumatic head, non tender sinuses. Lung -distant breath  sounds, bibasilar rales, diffuse rhonchi, no wheezes. Heart - S1, S2 heard, no murmurs, rubs, chronic 1+ pitting pedal edema. Abdomen - Soft, non tender, obese, PEG tube intact Neuro - Alert, awake and oriented, non focal exam. Skin - Warm and dry, left AKA Access: Left internal jugular central line, right chest permacath in place  Data Reviewed:      Latest Ref Rng & Units 03/05/2023    4:40 AM 03/04/2023    4:34 AM 03/03/2023    5:00 PM  CBC  WBC 4.0 - 10.5 K/uL 13.2  12.5  15.1   Hemoglobin 12.0 - 15.0 g/dL 9.5  9.8  47.8   Hematocrit 36.0 - 46.0 % 28.8  30.5  31.1   Platelets 150 - 400 K/uL 287  281  327       Latest Ref Rng & Units 03/05/2023    4:40 AM 03/04/2023    4:34 AM 03/03/2023    5:00 PM  BMP  Glucose 70 - 99 mg/dL 99  97  295   BUN 8 - 23 mg/dL 52  31  44   Creatinine 0.44 - 1.00 mg/dL 6.21  3.08  6.57   Sodium 135 - 145 mmol/L 134  133  130   Potassium 3.5 - 5.1 mmol/L 3.7  3.6  4.0   Chloride 98 - 111 mmol/L 99  102  98   CO2 22 - 32 mmol/L 25  24  26    Calcium 8.9 - 10.3 mg/dL 9.1  8.7  8.4    IR Radiologist Eval & Mgmt  Result Date: 03/04/2023 EXAM: NEW PATIENT OFFICE VISIT CHIEF COMPLAINT: See epic note HISTORY OF PRESENT ILLNESS: See epic note REVIEW OF SYSTEMS: See epic note PHYSICAL EXAMINATION: See epic note ASSESSMENT AND PLAN: See epic note Electronically Signed   By: Olive Bass M.D.   On: 03/04/2023 15:15     Family Communication: Updated daughter over phone  Disposition: Status is: Inpatient Remains inpatient appropriate because: Sepsis, IV antibiotics, hemodialysis  Planned Discharge Destination: Home with Home Health and Skilled nursing facility     MDM level 3-patient is on empiric antibiotic therapy.  She is ESRD on hemodialysis recent MRSA infection.  She is at high risk for sudden clinical deterioration.   Time spent: 35 mins  Author: Delfino Lovett, MD 03/05/2023 12:08 PM Secure chat 7am to 7pm For on call review  [email or web address].

## 2023-03-05 NOTE — Progress Notes (Addendum)
Daily Progress Note   Patient Name: Alexandra Foster       Date: 03/05/2023 DOB: 06/28/1950  Age: 72 y.o. MRN#: [id number] Attending Physician: Delfino Lovett, MD Primary Care Physician: [name], MD Admit Date: 02/27/2023  Reason for Consultation/Follow-up: Establishing goals of care  Subjective: Requested by primary team to discuss code status with family. Patient is resting in bed with sister at bedside. She does not speak during my visit. Called to speak with husband. He states their daughter manages any healthcare discussions or decisions and advises their daughter is HPOA.   Called to speak with patient's daughter. Daughter states she checks on her mother and father 5 days a week, and patient's sister helps the other 2 days per week. She discusses dialysis days and non-dialysis days. She states she helps her mother out of bed, and to get dressed, and to change her depends. She states her mother enjoys watching game shows.   Broached code status. Daughter states that she has been clear she wants CPR for her mother and does not want a DNR status; she wants full code. She states she wants everything done to try to keep her mother alive. She states she would not want to "put her through" having a tracheostomy, or being on a ventilator for up to 2 weeks. She states ventilator support has been discussed, and she agreed to temporary ventilator support if needed, for a few hours, to see how she does, and family could decide when to extubate. Discussed CPR, ROSC and ventilator support, and usual care. She states she wants all care, and will let staff know when she is ready to extubate.   Daughter is extremely clear she does not want to be asked about code status again as she is tired of being  asked these questions, and states she was just asked about this earlier today. Discussed that PMT is here for her if she would like to speak with [place] in the future, and can be called if she would like to speak with [place].     Length of Stay: 6  Current Medications: Scheduled Meds:   ascorbic acid  500 mg Per Tube BID   atorvastatin  20 mg Per Tube Daily   vitamin D3  2,000 Units Per Tube Daily   cholestyramine  4 g  Per Tube TID   escitalopram  10 mg Per Tube Daily   famotidine  10 mg Per Tube Daily   feeding supplement (NEPRO CARB STEADY)  1,000 mL Per Tube Q24H   feeding supplement (PROSource TF20)  60 mL Per Tube Daily   fludrocortisone  0.2 mg Per Tube Daily   free water  30 mL Per Tube Q4H   heparin  5,000 Units Subcutaneous Q8H   hydrOXYzine  100 mg Per Tube BID   insulin aspart  1-3 Units Subcutaneous Q4H   metoCLOPramide (REGLAN) injection  10 mg Intravenous Q8H   midodrine  20 mg Per Tube TID WC   multivitamin  1 tablet Per Tube QHS   mupirocin ointment  1 Application Nasal BID   nutrition supplement (JUVEN)  1 packet Per Tube BID BM   nystatin   Topical TID   sodium chloride flush  10-40 mL Intracatheter Q12H    Continuous Infusions:  piperacillin-tazobactam (ZOSYN)  IV 2.25 g (03/05/23 1229)    PRN Meds: alteplase, alteplase, bisacodyl, clonazePAM, heparin, heparin, liver oil-zinc oxide, LORazepam, morphine injection, ondansetron **OR** ondansetron (ZOFRAN) IV, senna-docusate, sodium chloride flush, traMADol, zinc oxide  Physical Exam Pulmonary:     Effort: Pulmonary effort is normal.  Neurological:     Mental Status: She is alert.             Vital Signs: BP (!) 93/54   Pulse 67   Temp 98 F (36.7 C)   Resp 16   Ht 5\' 4"  (1.626 m)   Wt 83.4 kg   SpO2 100%   BMI 31.56 kg/m  SpO2: SpO2: 100 % O2 Device: O2 Device: Room Air O2 Flow Rate: O2 Flow Rate (L/min): 2 L/min  Intake/output summary:  Intake/Output Summary (Last 24 hours) at 03/05/2023 1329 Last  data filed at 03/05/2023 8119 Gross per 24 hour  Intake 752 ml  Output 0 ml  Net 752 ml   LBM: Last BM Date : 03/04/23 Baseline Weight: Weight: 85.5 kg Most recent weight: Weight: 83.4 kg   Patient Active Problem List   Diagnosis Date Noted   Dyspnea and respiratory abnormalities 02/28/2023   Dyspnea on exertion 02/27/2023   Hx of AKA (above knee amputation), left (HCC) 02/27/2023   Truncal obesity 02/27/2023   Gastrostomy tube in place (HCC) 02/15/2023   Pressure injury of skin 02/15/2023   Septic shock (HCC) 02/14/2023   Aspiration pneumonia (HCC) 02/13/2023   Adrenal insufficiency (HCC) 02/13/2023   Malnutrition (HCC) 02/11/2023   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   Cholelithiasis without cholecystitis 02/11/2023   Bowel and bladder incontinence 02/11/2023   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   Diarrhea 12/28/2022   Dysphagia 12/26/2022   Anemia of chronic disease 12/24/2022   Anxiety and depression 12/06/2022   Type 2 diabetes mellitus with chronic kidney disease, with long-term current use of insulin (HCC) 12/06/2022   ESRD on hemodialysis (HCC) 12/06/2022   Subdural hemorrhage (HCC) 05/18/2021   Atrial fibrillation and flutter (HCC)    Sepsis (HCC) 05/04/2021   Personal history of COVID-19 07/14/2019   Osteoarthritis of both knees 01/14/2018   Urge incontinence 03/14/2016   Osteopenia 02/13/2016   OSA (obstructive sleep apnea) 01/08/2016   Allergic rhinitis 04/13/2015   COPD (chronic obstructive pulmonary disease) (HCC) 09/18/2014   Esophagitis, reflux 09/18/2014    Palliative Care Assessment & Plan     Recommendations/Plan: Daughter advises she has been asked multiple times and has been very  clear she wants full code status and would want CPR. She advises she would want short term ventilator support; and family can decide when to extubate.  She is very clear she does not want to be asked about code status again.   PMT  will sign off at this time as goals are set. Please reconsult of daughter requests to speak with [place].   Code Status: Code Status History     Date Active Date Inactive Code Status Order ID Comments User Context   03/02/2023 1312 03/02/2023 1437 Limited: Do not attempt resuscitation (DNR) -DNR-LIMITED -Do Not Intubate/DNI  [id number]  Vida Rigger, MD Inpatient   02/27/2023 1756 03/02/2023 1312 Full Code [id number]  Cox, Amy N, DO ED   02/13/2023 1514 02/20/2023 2316 Full Code [id number]  Emeline General, MD ED   12/06/2022 0132 01/04/2023 0112 Full Code [id number]  Mansy, Vernetta Honey, MD ED   05/04/2021 2329 06/13/2021 2307 Full Code [id number]  Jimmye Norman, NP ED   04/30/2019 1934 05/03/2019 1822 Full Code [id number]  Clydia Llano, MD Inpatient   04/29/2019 2048 04/30/2019 1830 Full Code [id number]  Mansy, Vernetta Honey, MD ED    Questions for Most Recent Historical Code Status (Order [id number])     Question Answer   If pulseless and not breathing No CPR or chest compressions.   In Pre-Arrest Conditions (Patient Is Breathing and Has A Pulse) Do not intubate. Provide all appropriate non-invasive medical interventions. Avoid ICU transfer unless indicated or required.   Consent: Discussion documented in EHR or advanced directives reviewed           Thank you for allowing the Palliative Medicine Team to assist in the care of this patient.   Morton Stall, NP  Please contact Palliative Medicine Team phone at [phone] for questions and concerns.

## 2023-03-05 NOTE — Progress Notes (Signed)
PHARMACY CONSULT NOTE  Pharmacy Consult for Electrolyte Monitoring and Replacement   Recent Labs: Potassium (mmol/L)  Date Value  03/05/2023 3.7  08/11/2013 3.3 (L)   Magnesium (mg/dL)  Date Value  16/01/9603 2.4   Calcium (mg/dL)  Date Value  54/12/8117 9.1   Calcium, Total (mg/dL)  Date Value  14/78/2956 9.2   Albumin (g/dL)  Date Value  21/30/8657 2.4 (L)  08/11/2013 3.1 (L)   Phosphorus (mg/dL)  Date Value  84/69/6295 3.5   Sodium (mmol/L)  Date Value  03/05/2023 134 (L)  08/11/2013 138   Assessment: 72 y.o. 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   Goal of Therapy:  Electrolytes within normal limits  Plan:  --No replacement warranted.  --Re-check electrolytes in AM  Elliot Gurney, PharmD, BCPS Clinical Pharmacist  03/05/2023 7:31 AM

## 2023-03-05 NOTE — Plan of Care (Signed)
  Problem: Fluid Volume: Goal: Hemodynamic stability will improve Outcome: Progressing   

## 2023-03-05 NOTE — Progress Notes (Signed)
[facility]  PROGRESS NOTE   Subjective:   Patient is known to our practice from previous admissions.  She was recently discharged on November 8.  She is brought back to the hospital by family due to concerns of coughing, difficulty breathing, concern of infection around the G-tube.      Update:  Patient due for hemodialysis treatment today. During the dialysis treatment blood pressure did drop a bit. We ordered albumin 5 g IV x 1.  Objective:  Vital signs: Blood pressure 94/60, pulse 65, temperature 98.2 F (36.8 C), temperature source Axillary, resp. rate 16, height 5\' 4"  (1.626 m), weight 82.4 kg, SpO2 98%.  Intake/Output Summary (Last 24 hours) at 03/05/2023 1615 Last data filed at 03/05/2023 3664 Gross per 24 hour  Intake 752 ml  Output 0 ml  Net 752 ml    Filed Weights   03/03/23 1724 03/04/23 0500 03/05/23 1432  Weight: 86.3 kg 83.4 kg 82.4 kg      Physical Exam: General: No acute distress  Head: Normocephalic, atraumatic. Moist oral mucosal membranes  Eyes: Anicteric  Lungs:  normal effort, coarse breath sounds  Heart: S1S2 no rubs  Abdomen:  Soft, nontender, bowel sounds present, G-tube in place  Extremities: 1+ lower extremity edema  Neurologic: Awake but confused  Skin: No lesions  Access: Rt chest permcath/Dr. Dew/02/16/2023    Basic Metabolic Panel: Recent Labs  Lab 03/02/23 0440 03/02/23 0441 03/03/23 0415 03/03/23 1700 03/04/23 0434 03/05/23 0440  NA 137  --  131* 130* 133* 134*  K 4.5  --  2.8* 4.0 3.6 3.7  CL 103  --  97* 98 102 99  CO2 20*  --  27 26 24 25   GLUCOSE 152*  --  101* 111* 97 99  BUN 76*  --  34* 44* 31* 52*  CREATININE 5.57*  --  2.66* 3.20* 2.57* 3.59*  CALCIUM 9.9  --  8.3* 8.4* 8.7* 9.1  MG  --  2.5* 1.8  --  2.2 2.4  PHOS 3.7  --  1.8* 2.5 2.3* 3.5   GFR: Estimated Creatinine Clearance: 14.3 mL/min (A) (by C-G formula based on SCr of 3.59 mg/dL (H)).  Liver Function Tests: Recent Labs  Lab  02/27/23 1449 03/01/23 0255 03/02/23 0440 03/03/23 1700  AST 59* 91*  --   --   ALT 74* 92*  --   --   ALKPHOS 157* 102  --   --   BILITOT 0.5 0.9  --   --   PROT 7.3 6.4*  --   --   ALBUMIN 3.0* 2.5* 2.3* 2.4*   No results for input(s): "LIPASE", "AMYLASE" in the last 168 hours. No results for input(s): "AMMONIA" in the last 168 hours.   CBC: Recent Labs  Lab 02/27/23 1449 02/28/23 0621 03/02/23 0441 03/03/23 0415 03/03/23 1700 03/04/23 0434 03/05/23 0440  WBC 15.7*   < > 28.6* 17.4* 15.1* 12.5* 13.2*  NEUTROABS 8.8*  --   --   --   --   --   --   HGB 12.4   < > 11.0* 9.4* 10.1* 9.8* 9.5*  HCT 39.8   < > 34.8* 29.7* 31.1* 30.5* 28.8*  MCV 82.4   < > 79.3* 80.9 79.9* 78.2* 79.6*  PLT 388   < > 359 296 327 281 287   < > = values in this interval not displayed.     HbA1C: Hgb A1c MFr Bld  Date/Time Value Ref Range Status  02/11/2023 02:55 PM 4.7 <5.7 % of total Hgb Final    Comment:    For the purpose of screening for the presence of diabetes: . <5.7%       Consistent with the absence of diabetes 5.7-6.4%    Consistent with increased risk for diabetes             (prediabetes) > or =6.5%  Consistent with diabetes . This assay result is consistent with a decreased risk of diabetes. . Currently, no consensus exists regarding use of hemoglobin A1c for diagnosis of diabetes in children. . According to American Diabetes Association (ADA) guidelines, hemoglobin A1c <7.0% represents optimal control in non-pregnant diabetic patients. Different metrics may apply to specific patient populations.  Standards of Medical Care in Diabetes(ADA). [name]   12/07/2022 02:13 AM 5.2 4.8 - 5.6 % Final    Comment:    (NOTE) Pre diabetes:          5.7%-6.4%  Diabetes:              >6.4%  Glycemic control for   <7.0% adults with diabetes     Urinalysis: No results for input(s): "COLORURINE", "LABSPEC", "PHURINE", "GLUCOSEU", "HGBUR", "BILIRUBINUR", "KETONESUR", "PROTEINUR",  "UROBILINOGEN", "NITRITE", "LEUKOCYTESUR" in the last 72 hours.  Invalid input(s): "APPERANCEUR"     Imaging: IR Radiologist Eval & Mgmt  Result Date: 03/04/2023 EXAM: NEW PATIENT OFFICE VISIT CHIEF COMPLAINT: See epic note HISTORY OF PRESENT ILLNESS: See epic note REVIEW OF SYSTEMS: See epic note PHYSICAL EXAMINATION: See epic note ASSESSMENT AND PLAN: See epic note Electronically Signed   By: Olive Bass M.D.   On: 03/04/2023 15:15     Medications:    piperacillin-tazobactam (ZOSYN)  IV 2.25 g (03/05/23 1229)    ascorbic acid  500 mg Per Tube BID   atorvastatin  20 mg Per Tube Daily   vitamin D3  2,000 Units Per Tube Daily   cholestyramine  4 g Per Tube TID   escitalopram  10 mg Per Tube Daily   famotidine  10 mg Per Tube Daily   feeding supplement (NEPRO CARB STEADY)  1,000 mL Per Tube Q24H   feeding supplement (PROSource TF20)  60 mL Per Tube Daily   fludrocortisone  0.2 mg Per Tube Daily   free water  30 mL Per Tube Q4H   heparin  5,000 Units Subcutaneous Q8H   hydrOXYzine  100 mg Per Tube BID   insulin aspart  1-3 Units Subcutaneous Q4H   metoCLOPramide (REGLAN) injection  10 mg Intravenous Q8H   midodrine  20 mg Per Tube TID WC   multivitamin  1 tablet Per Tube QHS   mupirocin ointment  1 Application Nasal BID   nutrition supplement (JUVEN)  1 packet Per Tube BID BM   nystatin   Topical TID   sodium chloride flush  10-40 mL Intracatheter Q12H    Assessment/ Plan:     72 year old female with history of hypertension, coronary artery disease, congestive heart failure, COPD, cirrhosis, diabetes, status post AKA, status post G-tube placement.   Patient is admitted for: Dyspnea on exertion [R06.09] Dyspnea and respiratory abnormalities [R06.00, R06.89] Dyspnea, unspecified type [R06.00]  Outpatient dialysis: [facility]/TTS/[facility] [address]/right IJ PermCath  #1: End-stage renal disease:  Patient has been scheduled for dialysis treatment today.  During the dialysis  treatment we were called and notified that her blood pressure was a bit low.  We instructed the team to give albumin 25 g IV x 1.   #2:  Chronic hypotension:   Patient has history of chronic hypotension from adrenal insufficiency. Requires midodrine 20 mg 3 times per day.  Albumin 25 g IV x 1 ordered during dialysis today.    #3:  Sepsis due to recurrent  aspiration Continue Zosyn as per hospitalist.  #4: Anemia of chronic kidney.   Lab Results  Component Value Date   HGB 9.5 (L) 03/05/2023   Hemoglobin slightly below goal at 9.5.  Hold off on Epogen for now.     LOS: 6 Alexandra Foster [facility] 11/21/20244:15 PM

## 2023-03-06 ENCOUNTER — Inpatient Hospital Stay: Payer: Medicare Other

## 2023-03-06 DIAGNOSIS — E1122 Type 2 diabetes mellitus with diabetic chronic kidney disease: Secondary | ICD-10-CM | POA: Diagnosis not present

## 2023-03-06 DIAGNOSIS — N186 End stage renal disease: Secondary | ICD-10-CM | POA: Diagnosis not present

## 2023-03-06 DIAGNOSIS — R1310 Dysphagia, unspecified: Secondary | ICD-10-CM | POA: Diagnosis not present

## 2023-03-06 DIAGNOSIS — R0609 Other forms of dyspnea: Secondary | ICD-10-CM | POA: Diagnosis not present

## 2023-03-06 LAB — CBC
HCT: 29.1 % — ABNORMAL LOW (ref 36.0–46.0)
Hemoglobin: 9.5 g/dL — ABNORMAL LOW (ref 12.0–15.0)
MCH: 25.6 pg — ABNORMAL LOW (ref 26.0–34.0)
MCHC: 32.6 g/dL (ref 30.0–36.0)
MCV: 78.4 fL — ABNORMAL LOW (ref 80.0–100.0)
Platelets: 284 10*3/uL (ref 150–400)
RBC: 3.71 MIL/uL — ABNORMAL LOW (ref 3.87–5.11)
RDW: 15.8 % — ABNORMAL HIGH (ref 11.5–15.5)
WBC: 14.8 10*3/uL — ABNORMAL HIGH (ref 4.0–10.5)
nRBC: 0 % (ref 0.0–0.2)

## 2023-03-06 LAB — BASIC METABOLIC PANEL
Anion gap: 10 (ref 5–15)
BUN: 28 mg/dL — ABNORMAL HIGH (ref 8–23)
CO2: 28 mmol/L (ref 22–32)
Calcium: 9 mg/dL (ref 8.9–10.3)
Chloride: 95 mmol/L — ABNORMAL LOW (ref 98–111)
Creatinine, Ser: 2.32 mg/dL — ABNORMAL HIGH (ref 0.44–1.00)
GFR, Estimated: 22 mL/min — ABNORMAL LOW (ref >60)
Glucose, Bld: 104 mg/dL — ABNORMAL HIGH (ref 70–99)
Potassium: 3.4 mmol/L — ABNORMAL LOW (ref 3.5–5.1)
Sodium: 133 mmol/L — ABNORMAL LOW (ref 135–145)

## 2023-03-06 LAB — GLUCOSE, CAPILLARY
Glucose-Capillary: 103 mg/dL — ABNORMAL HIGH (ref 70–99)
Glucose-Capillary: 112 mg/dL — ABNORMAL HIGH (ref 70–99)
Glucose-Capillary: 117 mg/dL — ABNORMAL HIGH (ref 70–99)
Glucose-Capillary: 126 mg/dL — ABNORMAL HIGH (ref 70–99)
Glucose-Capillary: 87 mg/dL (ref 70–99)

## 2023-03-06 MED ORDER — TRAMADOL HCL 50 MG PO TABS
25.0000 mg | ORAL_TABLET | Freq: Two times a day (BID) | ORAL | Status: DC | PRN
Start: 2023-03-06 — End: 2023-03-06

## 2023-03-06 MED ORDER — ALBUMIN HUMAN 25 % IV SOLN: 25.0000 g | Freq: Once | INTRAVENOUS | Status: DC

## 2023-03-06 MED ORDER — MORPHINE SULFATE (PF) 2 MG/ML IV SOLN
0.5000 mg | INTRAVENOUS | Status: DC | PRN
Start: 2023-03-06 — End: 2023-03-06

## 2023-03-06 MED ORDER — TRAMADOL HCL 50 MG PO TABS
25.0000 mg | ORAL_TABLET | Freq: Two times a day (BID) | ORAL | Status: DC | PRN
  Filled 2023-03-06: qty 1

## 2023-03-06 NOTE — Progress Notes (Signed)
[facility]  PROGRESS NOTE   Subjective:   Patient is known to our practice from previous admissions.  She was recently discharged on November 8.  She is brought back to the hospital by family due to concerns of coughing, difficulty breathing, concern of infection around the G-tube.      Update:  Patient underwent hemodialysis on Thursday.  700 cc of fluid was removed. .  Objective:  Vital signs: Blood pressure (!) 113/49, pulse 76, temperature 98.8 F (37.1 C), resp. rate 18, height 5\' 4"  (1.626 m), weight 81.7 kg, SpO2 93%.  Intake/Output Summary (Last 24 hours) at 03/06/2023 1208 Last data filed at 03/05/2023 1820 Gross per 24 hour  Intake --  Output 700 ml  Net -700 ml    Filed Weights   03/04/23 0500 03/05/23 1432 03/05/23 1823  Weight: 83.4 kg 82.4 kg 81.7 kg      Physical Exam: General: No acute distress  Head: Normocephalic, atraumatic. Moist oral mucosal membranes  Eyes: Anicteric  Lungs:  normal effort, coarse breath sounds  Heart: S1S2 no rubs  Abdomen:  Soft, nontender, bowel sounds present, G-tube in place  Extremities: 1+ lower extremity edema, Left AKA  Neurologic: Awake but confused  Skin: No lesions  Access: Rt chest permcath/Dr. Dew/02/16/2023    Basic Metabolic Panel: Recent Labs  Lab 03/02/23 0440 03/02/23 0441 03/03/23 0415 03/03/23 1700 03/04/23 0434 03/05/23 0440 03/06/23 0438  NA 137  --  131* 130* 133* 134* 133*  K 4.5  --  2.8* 4.0 3.6 3.7 3.4*  CL 103  --  97* 98 102 99 95*  CO2 20*  --  27 26 24 25 28   GLUCOSE 152*  --  101* 111* 97 99 104*  BUN 76*  --  34* 44* 31* 52* 28*  CREATININE 5.57*  --  2.66* 3.20* 2.57* 3.59* 2.32*  CALCIUM 9.9  --  8.3* 8.4* 8.7* 9.1 9.0  MG  --  2.5* 1.8  --  2.2 2.4  --   PHOS 3.7  --  1.8* 2.5 2.3* 3.5  --    GFR: Estimated Creatinine Clearance: 22 mL/min (A) (by C-G formula based on SCr of 2.32 mg/dL (H)).  Liver Function Tests: Recent Labs  Lab 02/27/23 1449  03/01/23 0255 03/02/23 0440 03/03/23 1700  AST 59* 91*  --   --   ALT 74* 92*  --   --   ALKPHOS 157* 102  --   --   BILITOT 0.5 0.9  --   --   PROT 7.3 6.4*  --   --   ALBUMIN 3.0* 2.5* 2.3* 2.4*   No results for input(s): "LIPASE", "AMYLASE" in the last 168 hours. No results for input(s): "AMMONIA" in the last 168 hours.   CBC: Recent Labs  Lab 02/27/23 1449 02/28/23 0621 03/03/23 0415 03/03/23 1700 03/04/23 0434 03/05/23 0440 03/06/23 0438  WBC 15.7*   < > 17.4* 15.1* 12.5* 13.2* 14.8*  NEUTROABS 8.8*  --   --   --   --   --   --   HGB 12.4   < > 9.4* 10.1* 9.8* 9.5* 9.5*  HCT 39.8   < > 29.7* 31.1* 30.5* 28.8* 29.1*  MCV 82.4   < > 80.9 79.9* 78.2* 79.6* 78.4*  PLT 388   < > 296 327 281 287 284   < > = values in this interval not displayed.     HbA1C: Hgb A1c MFr Bld  Date/Time Value  Ref Range Status  02/11/2023 02:55 PM 4.7 <5.7 % of total Hgb Final    Comment:    For the purpose of screening for the presence of diabetes: . <5.7%       Consistent with the absence of diabetes 5.7-6.4%    Consistent with increased risk for diabetes             (prediabetes) > or =6.5%  Consistent with diabetes . This assay result is consistent with a decreased risk of diabetes. . Currently, no consensus exists regarding use of hemoglobin A1c for diagnosis of diabetes in children. . According to American Diabetes Association (ADA) guidelines, hemoglobin A1c <7.0% represents optimal control in non-pregnant diabetic patients. Different metrics may apply to specific patient populations.  Standards of Medical Care in Diabetes(ADA). [name]   12/07/2022 02:13 AM 5.2 4.8 - 5.6 % Final    Comment:    (NOTE) Pre diabetes:          5.7%-6.4%  Diabetes:              >6.4%  Glycemic control for   <7.0% adults with diabetes     Urinalysis: No results for input(s): "COLORURINE", "LABSPEC", "PHURINE", "GLUCOSEU", "HGBUR", "BILIRUBINUR", "KETONESUR", "PROTEINUR", "UROBILINOGEN",  "NITRITE", "LEUKOCYTESUR" in the last 72 hours.  Invalid input(s): "APPERANCEUR"     Imaging: IR Radiologist Eval & Mgmt  Result Date: 03/04/2023 EXAM: NEW PATIENT OFFICE VISIT CHIEF COMPLAINT: See epic note HISTORY OF PRESENT ILLNESS: See epic note REVIEW OF SYSTEMS: See epic note PHYSICAL EXAMINATION: See epic note ASSESSMENT AND PLAN: See epic note Electronically Signed   By: Olive Bass M.D.   On: 03/04/2023 15:15     Medications:      ascorbic acid  500 mg Per Tube BID   atorvastatin  20 mg Per Tube Daily   vitamin D3  2,000 Units Per Tube Daily   cholestyramine  4 g Per Tube TID   escitalopram  10 mg Per Tube Daily   famotidine  10 mg Per Tube Daily   feeding supplement (NEPRO CARB STEADY)  1,000 mL Per Tube Q24H   feeding supplement (PROSource TF20)  60 mL Per Tube Daily   fludrocortisone  0.2 mg Per Tube Daily   free water  30 mL Per Tube Q4H   heparin  5,000 Units Subcutaneous Q8H   hydrOXYzine  100 mg Per Tube BID   insulin aspart  1-3 Units Subcutaneous Q4H   metoCLOPramide (REGLAN) injection  10 mg Intravenous Q8H   midodrine  20 mg Per Tube TID WC   multivitamin  1 tablet Per Tube QHS   mupirocin ointment  1 Application Nasal BID   nutrition supplement (JUVEN)  1 packet Per Tube BID BM   nystatin   Topical TID   sodium chloride flush  10-40 mL Intracatheter Q12H    Assessment/ Plan:     72 year old female with history of hypertension, coronary artery disease, congestive heart failure, COPD, cirrhosis, diabetes, status post AKA, status post G-tube placement.   Patient is admitted for: Dyspnea on exertion [R06.09] Dyspnea and respiratory abnormalities [R06.00, R06.89] Dyspnea, unspecified type [R06.00]  Outpatient dialysis: [facility]/TTS/[facility] [address]/right IJ PermCath  #1: End-stage renal disease:  -Continuing TTS schedule.  Patient had low blood pressure on Thursday required IV albumin infusion. -Next hemodialysis on Saturday    #2:  Chronic  hypotension:   Patient has history of chronic hypotension from adrenal insufficiency. Requires midodrine 20 mg 3 times per day.  Albumin  25 g IV x 1 ordered during dialysis today.   #3:  Sepsis due to recurrent  aspiration Continue Zosyn as per hospitalist.- completed   #4: Anemia of chronic kidney.   Lab Results  Component Value Date   HGB 9.5 (L) 03/06/2023   Hemoglobin slightly below goal at 9.5.  Hold off on Epogen for now.     LOS: 7 Krishika Bugge [facility] 11/22/202412:08 PM

## 2023-03-06 NOTE — Progress Notes (Signed)
Progress Note   Patient: Alexandra Foster GNF:[id number] DOB: [birth year] DOA: 02/27/2023     7 DOS: the patient was seen and examined on 03/06/2023   Brief hospital course: 72 y.o with significant PMH of Asthma, COPD, type diabetes mellitus, s/p G-tube, GERD, ESRD on HD(TuThSa), hypertension, osteoarthritis, paroxysmal atrial fibrillation and OSA on CPAP who was sent to the ED from her PCP's office for evaluation of possible sepsis. Patient had multiple prior admissions, recent septic shock due to MRSA bacteremia from infected PermCath, Enterococcus UTI.  Patient got vancomycin therapy for 10 days after new PermCath with insertion.   Patient was initially admitted to hospitalist service with impression of possible sepsis, started on broad-spectrum antibiotics cefepime, vancomycin.  On 03/01/2023 patient became hypotensive during hemodialysis, ICU team consulted who started him on vasopressor, stress to steroids, midodrine.  Patient's overall prognosis is poor, ICU attending discussed with patient's family regarding goals of care, she has made DNI but family wants CPR to be done.  Patient's blood pressure is better, tolerated hemodialysis transferred to [facility] service.  11/20 - Palliative care requested for f/up for GOC, transfer to floors. IR assessment for PEG clogging 11/21: Family is not ready to take her back and not quite open for goals of care conversation either 11/22: Completed IV antibiotics.  Daughter does not want to leave till Sunday.  I have ordered chest x-ray for today  Assessment and Plan: Septic shock Possible aspiration pneumonia Chronic infected decubitus ulcers Recent history of MRSA bacteremia status post removal of cath 8/24. Completed IV Zosyn course Patient's family declined TEE given high risk of respiratory failure due to sedative medications during procedure.  Chronic hypotension. Patient does have history of adrenal insufficiency. Continue midodrine 20 mg 3 times  per day. Continue Florinef therapy.  ESRD on HD Continue hemodialysis per nephrology team.  Diarrhea- Seems chronic.  Hyponatremia Hypokalemia Hypophosphatemia Electrolyte abnormalities after hemodialysis. Pharmacy consult ordered for electrolyte management.  Anemia of Chronic disease- Hemoglobin around 10. No active bleeding at this time. Continue to monitor H&H transfuse if hemoglobin less than 7. Continue erythropoietin as per nephrology.  Paroxysmal atrial fibrillation- Patient's home dose amiodarone therapy on hold due to bradycardia. Outpatient cardiology follow-up.  COPD OSA on CPAP No exacerbation of COPD. Continue current nebulizer treatments.  Anxiety and depression: Continue Lexapro, Atarax, Klonopin therapy.  History of left AKA Pressure injury of skin Dysphagia status post G-tube in place- G tube malfunction/clogging Continue tube feeds as per dietitian recommendations. IR evaluated the G Tube on 11/20 - both ports are flushing now.  Obesity BMI 32.66 Nutrition Documentation    Flowsheet Row ED to Hosp-Admission (Current) from [facility]/27/2023 in [facility]CCU  Nutrition Problem Inadequate oral intake  Etiology dysphagia  Nutrition Goal Patient will meet greater than or equal to 90% of their needs  Interventions Tube feeding      Out of bed to chair. Incentive spirometry if able to  Fall, aspiration precautions. DVT prophylaxis- Heparin sq   Code Status: Prior  Subjective: Wants to rotate in the bed if she is having some difficulty moving in the bed  Physical Exam: Vitals:   03/05/23 1820 03/05/23 1823 03/06/23 0030 03/06/23 0834  BP: 107/75  (!) 109/41 (!) 113/49  Pulse: 65  67 76  Resp: 18  18   Temp: 98.3 F (36.8 C)  98 F (36.7 C) 98.8 F (37.1 C)  TempSrc: Oral  Oral   SpO2: 99%  100% 93%  Weight:  81.7 kg    Height:        General - Elderly chronically ill looking African American obese female, no  apparent distress HEENT - PERRLA, EOMI, atraumatic head, non tender sinuses. Lung -clear to auscultation by laterally.  Decreased breath sounds at bases Heart - S1, S2 heard, no murmurs, rubs, chronic 1+ pitting pedal edema. Abdomen - Soft, non tender, obese, PEG tube intact Neuro - Alert, awake and oriented, non focal exam. Skin - Warm and dry, left AKA Access: Left internal jugular central line, right chest permacath in place  Data Reviewed:      Latest Ref Rng & Units 03/06/2023    4:38 AM 03/05/2023    4:40 AM 03/04/2023    4:34 AM  CBC  WBC 4.0 - 10.5 K/uL 14.8  13.2  12.5   Hemoglobin 12.0 - 15.0 g/dL 9.5  9.5  9.8   Hematocrit 36.0 - 46.0 % 29.1  28.8  30.5   Platelets 150 - 400 K/uL 284  287  281       Latest Ref Rng & Units 03/06/2023    4:38 AM 03/05/2023    4:40 AM 03/04/2023    4:34 AM  BMP  Glucose 70 - 99 mg/dL 960  99  97   BUN 8 - 23 mg/dL 28  52  31   Creatinine 0.44 - 1.00 mg/dL 4.54  0.98  1.19   Sodium 135 - 145 mmol/L 133  134  133   Potassium 3.5 - 5.1 mmol/L 3.4  3.7  3.6   Chloride 98 - 111 mmol/L 95  99  102   CO2 22 - 32 mmol/L 28  25  24    Calcium 8.9 - 10.3 mg/dL 9.0  9.1  8.7    No results found.   Family Communication: Updated daughter over phone  Disposition: Status is: Inpatient Remains inpatient appropriate because: Family does not feel she is ready to go home yet  Planned Discharge Destination: Home with Home Health and Skilled nursing facility     MDM level 3-patient is on empiric antibiotic therapy.  She is ESRD on hemodialysis recent MRSA infection.  She is at high risk for sudden clinical deterioration.   Time spent: 35 mins  Author: Delfino Lovett, MD 03/06/2023 2:09 PM Secure chat 7am to 7pm For on call review [email or web address].

## 2023-03-06 NOTE — Progress Notes (Signed)
PHARMACY CONSULT NOTE  Pharmacy Consult for Electrolyte Monitoring and Replacement   Recent Labs: Potassium (mmol/L)  Date Value  03/06/2023 3.4 (L)  08/11/2013 3.3 (L)   Magnesium (mg/dL)  Date Value  60/73/7106 2.4   Calcium (mg/dL)  Date Value  26/94/8546 9.0   Calcium, Total (mg/dL)  Date Value  27/06/5007 9.2   Albumin (g/dL)  Date Value  38/18/2993 2.4 (L)  08/11/2013 3.1 (L)   Phosphorus (mg/dL)  Date Value  71/69/6789 3.5   Sodium (mmol/L)  Date Value  03/06/2023 133 (L)  08/11/2013 138   Assessment: 72 y.o. 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   Goal of Therapy:  Electrolytes within normal limits  Plan:  --K+ 3.4. Received HD 11/21. Will defer replacement to nephrology.  --Re-check electrolytes in AM  Elliot Gurney, PharmD, BCPS Clinical Pharmacist  03/06/2023 7:30 AM

## 2023-03-06 NOTE — Plan of Care (Signed)
  Problem: Fluid Volume: Goal: Hemodynamic stability will improve 03/06/2023 0229 by Danford Bad, RN Outcome: Progressing 03/06/2023 0229 by Danford Bad, RN Outcome: Progressing   Problem: Clinical Measurements: Goal: Diagnostic test results will improve 03/06/2023 0229 by Danford Bad, RN Outcome: Progressing 03/06/2023 0229 by Danford Bad, RN Outcome: Progressing Goal: Signs and symptoms of infection will decrease 03/06/2023 0229 by Danford Bad, RN Outcome: Progressing 03/06/2023 0229 by Danford Bad, RN Outcome: Progressing   Problem: Education: Goal: Knowledge of General Education information will improve Description: Including pain rating scale, medication(s)/side effects and non-pharmacologic comfort measures 03/06/2023 0229 by Danford Bad, RN Outcome: Progressing 03/06/2023 0229 by Danford Bad, RN Outcome: Progressing   Problem: Clinical Measurements: Goal: Ability to maintain clinical measurements within normal limits will improve 03/06/2023 0229 by Danford Bad, RN Outcome: Progressing 03/06/2023 0229 by Danford Bad, RN Outcome: Progressing Goal: Will remain free from infection 03/06/2023 0229 by Danford Bad, RN Outcome: Progressing 03/06/2023 0229 by Danford Bad, RN Outcome: Progressing Goal: Diagnostic test results will improve 03/06/2023 0229 by Danford Bad, RN Outcome: Progressing 03/06/2023 0229 by Danford Bad, RN Outcome: Progressing Goal: Cardiovascular complication will be avoided 03/06/2023 0229 by Danford Bad, RN Outcome: Progressing 03/06/2023 0229 by Danford Bad, RN Outcome: Progressing   Problem: Activity: Goal: Risk for activity intolerance will decrease 03/06/2023 0229 by Danford Bad, RN Outcome: Progressing 03/06/2023 0229 by Danford Bad, RN Outcome: Progressing   Problem: Nutrition: Goal: Adequate nutrition will  be maintained 03/06/2023 0229 by Danford Bad, RN Outcome: Progressing 03/06/2023 0229 by Danford Bad, RN Outcome: Progressing   Problem: Coping: Goal: Level of anxiety will decrease 03/06/2023 0229 by Danford Bad, RN Outcome: Progressing 03/06/2023 0229 by Danford Bad, RN Outcome: Progressing   Problem: Elimination: Goal: Will not experience complications related to bowel motility 03/06/2023 0229 by Danford Bad, RN Outcome: Progressing 03/06/2023 0229 by Danford Bad, RN Outcome: Progressing Goal: Will not experience complications related to urinary retention 03/06/2023 0229 by Danford Bad, RN Outcome: Progressing 03/06/2023 0229 by Danford Bad, RN Outcome: Progressing   Problem: Pain Management: Goal: General experience of comfort will improve 03/06/2023 0229 by Danford Bad, RN Outcome: Progressing 03/06/2023 0229 by Danford Bad, RN Outcome: Progressing   Problem: Safety: Goal: Ability to remain free from injury will improve 03/06/2023 0229 by Danford Bad, RN Outcome: Progressing 03/06/2023 0229 by Danford Bad, RN Outcome: Progressing   Problem: Skin Integrity: Goal: Risk for impaired skin integrity will decrease 03/06/2023 0229 by Danford Bad, RN Outcome: Progressing 03/06/2023 0229 by Danford Bad, RN Outcome: Progressing

## 2023-03-06 NOTE — Care Management Important Message (Signed)
Important Message  Patient Details  Name: Alexandra Foster MRN: [id number] Date of Birth: 1950-06-10   Important Message Given:  Yes - Medicare IM     Olegario Messier A Dominik Yordy 03/06/2023, 10:06 AM

## 2023-03-07 DIAGNOSIS — E1122 Type 2 diabetes mellitus with diabetic chronic kidney disease: Secondary | ICD-10-CM | POA: Diagnosis not present

## 2023-03-07 DIAGNOSIS — F419 Anxiety disorder, unspecified: Secondary | ICD-10-CM | POA: Diagnosis not present

## 2023-03-07 DIAGNOSIS — E2740 Unspecified adrenocortical insufficiency: Secondary | ICD-10-CM | POA: Diagnosis not present

## 2023-03-07 DIAGNOSIS — R0609 Other forms of dyspnea: Secondary | ICD-10-CM | POA: Diagnosis not present

## 2023-03-07 LAB — ALBUMIN: Albumin: 2.5 g/dL — ABNORMAL LOW (ref 3.5–5.0)

## 2023-03-07 LAB — CBC
HCT: 29.4 % — ABNORMAL LOW (ref 36.0–46.0)
Hemoglobin: 9.5 g/dL — ABNORMAL LOW (ref 12.0–15.0)
MCH: 25.4 pg — ABNORMAL LOW (ref 26.0–34.0)
MCHC: 32.3 g/dL (ref 30.0–36.0)
MCV: 78.6 fL — ABNORMAL LOW (ref 80.0–100.0)
Platelets: 325 10*3/uL (ref 150–400)
RBC: 3.74 MIL/uL — ABNORMAL LOW (ref 3.87–5.11)
RDW: 16 % — ABNORMAL HIGH (ref 11.5–15.5)
WBC: 14 10*3/uL — ABNORMAL HIGH (ref 4.0–10.5)
nRBC: 0.1 % (ref 0.0–0.2)

## 2023-03-07 LAB — BASIC METABOLIC PANEL
Anion gap: 8 (ref 5–15)
BUN: 42 mg/dL — ABNORMAL HIGH (ref 8–23)
CO2: 28 mmol/L (ref 22–32)
Calcium: 8.9 mg/dL (ref 8.9–10.3)
Chloride: 99 mmol/L (ref 98–111)
Creatinine, Ser: 3.56 mg/dL — ABNORMAL HIGH (ref 0.44–1.00)
GFR, Estimated: 13 mL/min — ABNORMAL LOW (ref >60)
Glucose, Bld: 106 mg/dL — ABNORMAL HIGH (ref 70–99)
Potassium: 3.5 mmol/L (ref 3.5–5.1)
Sodium: 135 mmol/L (ref 135–145)

## 2023-03-07 LAB — GLUCOSE, CAPILLARY
Glucose-Capillary: 100 mg/dL — ABNORMAL HIGH (ref 70–99)
Glucose-Capillary: 101 mg/dL — ABNORMAL HIGH (ref 70–99)
Glucose-Capillary: 106 mg/dL — ABNORMAL HIGH (ref 70–99)
Glucose-Capillary: 117 mg/dL — ABNORMAL HIGH (ref 70–99)
Glucose-Capillary: 88 mg/dL (ref 70–99)

## 2023-03-07 LAB — PHOSPHORUS: Phosphorus: 3.4 mg/dL (ref 2.5–4.6)

## 2023-03-07 NOTE — Procedures (Signed)
HD Note:  Some information was entered later than the data was gathered due to patient care needs. The stated time with the data is accurate.  Received patient in bed to unit.   Alert and oriented.   Informed consent signed and in chart.   Access used: Upper right chest HD catheter Access issues: None  Patient BP lowered during the treatment.  UF goal reduced to 800 ml to accommodate.    TX duration 3.5 hours  Alert, without acute distress.  Total UF removed: 800 ml  Hand-off given to patient's nurse.   Transported back to the room   Inmer Nix L. Dareen Piano, RN Kidney Dialysis Unit.

## 2023-03-07 NOTE — Progress Notes (Signed)
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 Patient had multiple prior admissions, recent septic shock due to MRSA bacteremia from infected PermCath, Enterococcus UTI.  Patient got vancomycin therapy for 10 days after new PermCath with insertion.   Patient was initially admitted to hospitalist service with impression of possible sepsis, started on broad-spectrum antibiotics cefepime, vancomycin.  On 03/01/2023 patient became hypotensive during hemodialysis, ICU team consulted who started him on vasopressor, stress to steroids, midodrine.  Patient's overall prognosis is poor, ICU attending discussed with patient's family regarding goals of care, she has made DNI but family wants CPR to be done.  Patient's blood pressure is better, tolerated hemodialysis transferred to [facility] service.  11/20 - Palliative care requested for f/up for GOC, transfer to floors. IR assessment for PEG clogging 11/21: Family is not ready to take her back and not quite open for goals of care conversation either 11/22-11/23: Completed IV antibiotics.  Daughter does not want to have her leave till Sunday.  Chest x-ray showing no acute changes  Assessment and Plan: Septic shock Possible aspiration pneumonia Chronic infected decubitus ulcers Recent history of MRSA bacteremia status post removal of cath 8/24. Completed IV Zosyn course Patient's family declined TEE given high risk of respiratory failure due to sedative medications during procedure.  Chronic hypotension. Patient does have history of adrenal insufficiency. Continue midodrine  20 mg 3 times per day. Continue Florinef therapy.  ESRD on HD Continue hemodialysis per nephrology team.  Diarrhea- Seems chronic.  Hyponatremia Hypokalemia Hypophosphatemia Electrolyte abnormalities after hemodialysis. Pharmacy consult ordered for electrolyte management.  Anemia of Chronic disease- Hemoglobin around 10. No active bleeding at this time. Continue to monitor H&H transfuse if hemoglobin less than 7. Continue erythropoietin as per nephrology.  Paroxysmal atrial fibrillation- Patient's home dose amiodarone therapy on hold due to bradycardia. Outpatient cardiology follow-up.  COPD OSA on CPAP No exacerbation of COPD. Continue current nebulizer treatments.  Anxiety and depression: Continue Lexapro, Atarax, Klonopin therapy.  History of left AKA Pressure injury of skin Dysphagia status post G-tube in place- G tube malfunction/clogging Continue tube feeds as per dietitian recommendations. IR evaluated the G Tube on 11/20 - both ports are flushing now.  Obesity BMI 32.66 Nutrition Documentation    Flowsheet Row ED to Hosp-Admission (Current) from [facility]/27/2023 in [facility]CCU  Nutrition Problem Inadequate oral intake  Etiology dysphagia  Nutrition Goal Patient will meet greater than or equal to 90% of their needs  Interventions Tube feeding      Out of bed to chair. Incentive spirometry if able to  Fall, aspiration precautions. DVT prophylaxis- Heparin sq   Code Status: Full Code  Subjective: Patient was just given skin care.  Lot of dry flaky skin all over her bed and around her bed on the floor.  Daughter at bedside requesting Foley.  I explained that not being a good idea to avoid risk for recurrent UTI.  No new issues.  Daughter does not want to get discharge till tomorrow  Physical Exam: Vitals:   03/07/23 1009 03/07/23 1030 03/07/23 1100 03/07/23 1130  BP: (!) 96/59 (!) 97/54  94/64 (!) 96/56  Pulse: 74 67 64 70   Resp: (!) 24 20 20 18   Temp:      TempSrc:      SpO2: 100% 99% 100% 100%  Weight:      Height:        General - Elderly chronically ill looking African American obese female, no apparent distress HEENT - PERRLA, EOMI, atraumatic head, non tender sinuses. Lung -clear to auscultation by laterally.  Decreased breath sounds at bases Heart - S1, S2 heard, no murmurs, rubs,  Abdomen - Soft, non tender, obese, PEG tube intact Neuro - Alert, awake and oriented, non focal exam. Skin - Warm and dry, left AKA Access: Left internal jugular central line, right chest permacath in place  Data Reviewed:      Latest Ref Rng & Units 03/07/2023    5:03 AM 03/06/2023    4:38 AM 03/05/2023    4:40 AM  CBC  WBC 4.0 - 10.5 K/uL 14.0  14.8  13.2   Hemoglobin 12.0 - 15.0 g/dL 9.5  9.5  9.5   Hematocrit 36.0 - 46.0 % 29.4  29.1  28.8   Platelets 150 - 400 K/uL 325  284  287       Latest Ref Rng & Units 03/07/2023    5:03 AM 03/06/2023    4:38 AM 03/05/2023    4:40 AM  BMP  Glucose 70 - 99 mg/dL 782  956  99   BUN 8 - 23 mg/dL 42  28  52   Creatinine 0.44 - 1.00 mg/dL 2.13  0.86  5.78   Sodium 135 - 145 mmol/L 135  133  134   Potassium 3.5 - 5.1 mmol/L 3.5  3.4  3.7   Chloride 98 - 111 mmol/L 99  95  99   CO2 22 - 32 mmol/L 28  28  25    Calcium 8.9 - 10.3 mg/dL 8.9  9.0  9.1    DG Chest Port 1 View  Result Date: 03/06/2023 CLINICAL DATA:  Dyspnea. EXAM: PORTABLE CHEST 1 VIEW COMPARISON:  Chest x-ray dated March 01, 2023. FINDINGS: The patient is rotated to the right, limiting evaluation. Unchanged tunneled right internal jugular dialysis catheter and left internal jugular central venous catheter. Stable cardiomediastinal silhouette with normal heart size. Unchanged mild bibasilar atelectasis. No focal consolidation, pleural effusion, or pneumothorax. No acute osseous abnormality. IMPRESSION: 1. Unchanged mild bibasilar atelectasis. Electronically Signed   By: Obie Dredge M.D.   On:  03/06/2023 18:21     Family Communication: Updated daughter over phone  Disposition: Status is: Inpatient Remains inpatient appropriate because: Family does not feel she is ready to go home yet.  She is agreeable for discharge tomorrow  Planned Discharge Destination: Home with Home Health and Skilled nursing facility     MDM level 3-patient is on empiric antibiotic therapy.  She is ESRD on hemodialysis recent MRSA infection.  She is at high risk for sudden clinical deterioration.   Time spent: 35 mins  Author: Delfino Lovett, MD 03/07/2023 12:21 PM Secure chat 7am to 7pm For on call review [email or web address].

## 2023-03-07 NOTE — Progress Notes (Signed)
PHARMACY CONSULT NOTE  Pharmacy Consult for Electrolyte Monitoring and Replacement   Recent Labs: Potassium (mmol/L)  Date Value  03/07/2023 3.5  08/11/2013 3.3 (L)   Magnesium (mg/dL)  Date Value  16/01/9603 2.4   Calcium (mg/dL)  Date Value  54/12/8117 8.9   Calcium, Total (mg/dL)  Date Value  14/78/2956 9.2   Albumin (g/dL)  Date Value  21/30/8657 2.5 (L)  08/11/2013 3.1 (L)   Phosphorus (mg/dL)  Date Value  84/69/6295 3.4   Sodium (mmol/L)  Date Value  03/07/2023 135  08/11/2013 138   Assessment: 72 y.o. 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   Goal of Therapy:  Electrolytes within normal limits  Plan:  --No replacement currently indicated --Re-check electrolytes in AM  Bettey Costa, PharmD Clinical Pharmacist 03/07/2023 8:10 AM

## 2023-03-07 NOTE — Plan of Care (Signed)
  Problem: Fluid Volume: Goal: Hemodynamic stability will improve Outcome: Progressing   Problem: Clinical Measurements: Goal: Diagnostic test results will improve Outcome: Progressing   Problem: Health Behavior/Discharge Planning: Goal: Ability to manage health-related needs will improve Outcome: Progressing   Problem: Clinical Measurements: Goal: Ability to maintain clinical measurements within normal limits will improve Outcome: Progressing   Problem: Activity: Goal: Risk for activity intolerance will decrease Outcome: Progressing   Problem: Nutrition: Goal: Adequate nutrition will be maintained Outcome: Progressing   Problem: Pain Management: Goal: General experience of comfort will improve Outcome: Progressing

## 2023-03-07 NOTE — Plan of Care (Signed)
Problem: Fluid Volume: Goal: Hemodynamic stability will improve Outcome: Progressing   Problem: Clinical Measurements: Goal: Diagnostic test results will improve Outcome: Progressing Goal: Signs and symptoms of infection will decrease Outcome: Progressing   Problem: Education: Goal: Knowledge of General Education information will improve Description: Including pain rating scale, medication(s)/side effects and non-pharmacologic comfort measures Outcome: Progressing

## 2023-03-07 NOTE — TOC Progression Note (Signed)
Transition of Care [facility]) - Progression Note    Patient Details  Name: Alexandra Foster MRN: [id number] Date of Birth: Dec 02, 1950  [facility] of [facility]) CM/SW Contact  Liliana Cline, LCSW Phone Number: 03/07/2023, 2:06 PM  Clinical Narrative:    CSW notified by MD that plan is for patient to DC tomorrow with home health and will need EMS transport. CSW attempted call to daughter to confirm plan and confirm address.  Per TOC handoff, patient was active with [facility]. Reached out to [facility] with [facility] to confirm coverage and notify her of DC plan for tomorrow. Heather with [facility] confirmed they are active with patient for RN services.         Expected Discharge Plan and Services                                               Social Determinants of Health (SDOH) Interventions SDOH Screenings   Food Insecurity: No Food Insecurity (02/28/2023)  Housing: Patient Declined (03/04/2023)  Recent Concern: Housing - High Risk (12/06/2022)  Transportation Needs: No Transportation Needs (03/04/2023)  Utilities: Not At Risk (03/04/2023)  Alcohol Screen: Low Risk  (02/11/2021)  Depression (PHQ2-9): Medium Risk (02/27/2023)  Financial Resource Strain: Medium Risk (08/09/2020)  Physical Activity: Inactive (08/09/2020)  Social Connections: Moderately Integrated (08/09/2020)  Stress: No Stress Concern Present (08/09/2020)  Tobacco Use: Medium Risk (02/27/2023)    Readmission Risk Interventions    02/16/2023   12:26 PM 12/26/2022   11:15 AM  Readmission Risk Prevention Plan  Transportation Screening Complete Complete  PCP or Specialist Appt within 3-5 Days Complete   HRI or Home Care Consult Complete   Social Work Consult for Recovery Care Planning/Counseling Complete   Palliative Care Screening Not Applicable   Medication Review Oceanographer) Complete Complete  PCP or Specialist appointment within 3-5 days of discharge  Complete  HRI or Home  Care Consult  Complete  SW Recovery Care/Counseling Consult  Complete  Palliative Care Screening  Not Applicable  Skilled Nursing Facility  Not Applicable

## 2023-03-07 NOTE — Progress Notes (Signed)
[facility]  Dialysis Note   Subjective:   Seen and examined on hemodialysis treatment.  Complained of some nausea this morning.   HEMODIALYSIS FLOWSHEET:  Blood Flow Rate (mL/min): 0 mL/min Arterial Pressure (mmHg): -233.12 mmHg Venous Pressure (mmHg): 185.45 mmHg TMP (mmHg): 0 mmHg Ultrafiltration Rate (mL/min): 410 mL/min Dialysate Flow Rate (mL/min): 300 ml/min Dialysis Fluid Bolus: Normal Saline Bolus Amount (mL): 100 mL    Objective:  Vital signs in last 24 hours:  Temp:  [98.6 F (37 C)-99.1 F (37.3 C)] 99.1 F (37.3 C) (11/23 1243) Pulse Rate:  [62-75] 73 (11/23 1252) Resp:  [16-26] 25 (11/23 1252) BP: (90-117)/(37-79) 101/37 (11/23 1252) SpO2:  [94 %-100 %] 100 % (11/23 1252) Weight:  [82.5 kg-85.1 kg] 82.5 kg (11/23 0858)  Weight change:  Filed Weights   03/05/23 1823 03/07/23 0704 03/07/23 0858  Weight: 81.7 kg 85.1 kg 82.5 kg    Intake/Output: No intake/output data recorded.   Intake/Output this shift:  Total I/O In: -  Out: 800 [Other:800]  Physical Exam: General: NAD,   Head: Normocephalic, atraumatic. Moist oral mucosal membranes  Eyes: Anicteric, PERRL  Neck: Supple, trachea midline  Lungs:  Clear to auscultation  Heart: Regular rate and rhythm  Abdomen:  Soft, nontender,   Extremities:  peripheral edema.  Neurologic: Nonfocal, moving all four extremities  Skin: No lesions  Access:     Basic Metabolic Panel: Recent Labs  Lab 03/02/23 0441 03/03/23 0415 03/03/23 1700 03/04/23 0434 03/05/23 0440 03/06/23 0438 03/07/23 0503  NA  --  131* 130* 133* 134* 133* 135  K  --  2.8* 4.0 3.6 3.7 3.4* 3.5  CL  --  97* 98 102 99 95* 99  CO2  --  27 26 24 25 28 28   GLUCOSE  --  101* 111* 97 99 104* 106*  BUN  --  34* 44* 31* 52* 28* 42*  CREATININE  --  2.66* 3.20* 2.57* 3.59* 2.32* 3.56*  CALCIUM  --  8.3* 8.4* 8.7* 9.1 9.0 8.9  MG 2.5* 1.8  --  2.2 2.4  --   --   PHOS  --  1.8* 2.5 2.3* 3.5  --  3.4    Liver Function  Tests: Recent Labs  Lab 03/01/23 0255 03/02/23 0440 03/03/23 1700 03/07/23 0503  AST 91*  --   --   --   ALT 92*  --   --   --   ALKPHOS 102  --   --   --   BILITOT 0.9  --   --   --   PROT 6.4*  --   --   --   ALBUMIN 2.5* 2.3* 2.4* 2.5*   No results for input(s): "LIPASE", "AMYLASE" in the last 168 hours. No results for input(s): "AMMONIA" in the last 168 hours.  CBC: Recent Labs  Lab 03/03/23 1700 03/04/23 0434 03/05/23 0440 03/06/23 0438 03/07/23 0503  WBC 15.1* 12.5* 13.2* 14.8* 14.0*  HGB 10.1* 9.8* 9.5* 9.5* 9.5*  HCT 31.1* 30.5* 28.8* 29.1* 29.4*  MCV 79.9* 78.2* 79.6* 78.4* 78.6*  PLT 327 281 287 284 325    Cardiac Enzymes: No results for input(s): "CKTOTAL", "CKMB", "CKMBINDEX", "TROPONINI" in the last 168 hours.  BNP: Invalid input(s): "POCBNP"  CBG: Recent Labs  Lab 03/06/23 1737 03/06/23 1959 03/07/23 0002 03/07/23 0534 03/07/23 0810  GLUCAP 103* 117* 106* 101* 88    Microbiology: Results for orders placed or performed during the hospital encounter of 02/27/23  Blood  Culture (routine x 2)     Status: None   Collection Time: 02/27/23  2:49 PM   Specimen: BLOOD LEFT ARM  Result Value Ref Range Status   Specimen Description BLOOD LEFT ARM  Final   Special Requests   Final    BOTTLES DRAWN AEROBIC AND ANAEROBIC Blood Culture adequate volume   Culture   Final    NO GROWTH 5 DAYS Performed at [facility], [address], [place], [place] [address]    Report Status 03/04/2023 FINAL  Final  Blood Culture (routine x 2)     Status: None   Collection Time: 02/28/23  6:21 AM   Specimen: BLOOD  Result Value Ref Range Status   Specimen Description BLOOD BLOOD RIGHT HAND  Final   Special Requests   Final    BOTTLES DRAWN AEROBIC AND ANAEROBIC Blood Culture results may not be optimal due to an inadequate volume of blood received in culture bottles   Culture   Final    NO GROWTH 5 DAYS Performed at [facility], [address], [place], [place] [address]    Report Status 03/05/2023 FINAL  Final  MRSA Next Gen by PCR, Nasal     Status: Abnormal   Collection Time: 03/03/23  8:47 AM   Specimen: Nasal Mucosa; Nasal Swab  Result Value Ref Range Status   MRSA by PCR Next Gen DETECTED (A) NOT DETECTED Final    Comment: RESULT CALLED TO, READ BACK BY AND VERIFIED WITH: Cleda Clarks RN 1120 03/03/23 HNM (NOTE) The GeneXpert MRSA Assay (FDA approved for NASAL specimens only), is one component of a comprehensive MRSA colonization surveillance program. It is not intended to diagnose MRSA infection nor to guide or monitor treatment for MRSA infections. Test performance is not FDA approved in patients less than 35 years old. Performed at [facility], [address] [place], [place] [address]     Coagulation Studies: No results for input(s): "LABPROT", "INR" in the last 72 hours.  Urinalysis: No results for input(s): "COLORURINE", "LABSPEC", "PHURINE", "GLUCOSEU", "HGBUR", "BILIRUBINUR", "KETONESUR", "PROTEINUR", "UROBILINOGEN", "NITRITE", "LEUKOCYTESUR" in the last 72 hours.  Invalid input(s): "APPERANCEUR"    Imaging: DG Chest Port 1 View  Result Date: 03/06/2023 CLINICAL DATA:  Dyspnea. EXAM: PORTABLE CHEST 1 VIEW COMPARISON:  Chest x-ray dated March 01, 2023. FINDINGS: The patient is rotated to the right, limiting evaluation. Unchanged tunneled right internal jugular dialysis catheter and left internal jugular central venous catheter. Stable cardiomediastinal silhouette with normal heart size. Unchanged mild bibasilar atelectasis. No focal consolidation, pleural effusion, or pneumothorax. No acute osseous abnormality. IMPRESSION: 1. Unchanged mild bibasilar atelectasis. Electronically Signed   By: Obie Dredge M.D.   On: 03/06/2023 18:21     Medications:    albumin human      ascorbic acid  500 mg Per Tube BID   atorvastatin  20 mg Per Tube Daily   vitamin D3  2,000 Units Per Tube Daily    cholestyramine  4 g Per Tube TID   escitalopram  10 mg Per Tube Daily   famotidine  10 mg Per Tube Daily   feeding supplement (NEPRO CARB STEADY)  1,000 mL Per Tube Q24H   feeding supplement (PROSource TF20)  60 mL Per Tube Daily   fludrocortisone  0.2 mg Per Tube Daily   free water  30 mL Per Tube Q4H   heparin  5,000 Units Subcutaneous Q8H   hydrOXYzine  100 mg Per Tube BID  insulin aspart  1-3 Units Subcutaneous Q4H   metoCLOPramide (REGLAN) injection  10 mg Intravenous Q8H   midodrine  20 mg Per Tube TID WC   multivitamin  1 tablet Per Tube QHS   mupirocin ointment  1 Application Nasal BID   nutrition supplement (JUVEN)  1 packet Per Tube BID BM   nystatin   Topical TID   sodium chloride flush  10-40 mL Intracatheter Q12H   bisacodyl, clonazePAM, liver oil-zinc oxide, LORazepam, senna-docusate, sodium chloride flush, traMADol, zinc oxide  Assessment/ Plan:  [name] is a 72 y.o.  female   Principal Problem:   Dyspnea on exertion Active Problems:   OSA (obstructive sleep apnea)   Osteopenia   Anxiety and depression   Type 2 diabetes mellitus with chronic kidney disease, with long-term current use of insulin (HCC)   ESRD on hemodialysis (HCC)   Dysphagia   Malnutrition (HCC)   History of necrotizing fasciitis   Adrenal insufficiency (HCC)   Gastrostomy tube in place (HCC)   Hx of AKA (above knee amputation), left (HCC)   Truncal obesity   Dyspnea and respiratory abnormalities     End Stage Renal Disease on hemodialysis: Continue stable dialysis.  Potassium [facility] vascular lab)  Date Value Ref Range Status  02/04/2023 4.5 3.5 - 5.1 mmol/L Final    Comment:    Performed at [facility], [address] [place], [place] [address]    Intake/Output Summary (Last 24 hours) at 03/07/2023 1329 Last data filed at 03/07/2023 1252 Gross per 24 hour  Intake --  Output 800 ml  Net -800 ml    2. Hypertension with chronic kidney disease: Blood  pressure is well-controlled.   BP (!) 101/37   Pulse 73   Temp 99.1 F (37.3 C)   Resp (!) 25   Ht 5\' 4"  (1.626 m)   Wt 82.5 kg   SpO2 100%   BMI 31.22 kg/m   3. Anemia of chronic kidney disease/ kidney injury/chronic disease/acute blood loss: Will follow anemia protocols. Lab Results  Component Value Date   HGB 9.5 (L) 03/07/2023    4. Secondary Hyperparathyroidism: No need for binders at this time.   Lab Results  Component Value Date   CALCIUM 8.9 03/07/2023   PHOS 3.4 03/07/2023   5.  Sepsis/aspiration pneumonia/infected decub ulcers: Patient completed the course of antibiotics with Zosyn.  Presently hemodynamically stable.  Continue supportive care.    LOS: 8 Lorain Childes, MD [facility] 11/23/20241:29 PM

## 2023-03-08 ENCOUNTER — Other Ambulatory Visit: Payer: Self-pay | Admitting: Family Medicine

## 2023-03-08 DIAGNOSIS — Z8739 Personal history of other diseases of the musculoskeletal system and connective tissue: Secondary | ICD-10-CM | POA: Diagnosis not present

## 2023-03-08 DIAGNOSIS — E65 Localized adiposity: Secondary | ICD-10-CM

## 2023-03-08 DIAGNOSIS — R0609 Other forms of dyspnea: Secondary | ICD-10-CM | POA: Diagnosis not present

## 2023-03-08 DIAGNOSIS — R0600 Dyspnea, unspecified: Secondary | ICD-10-CM | POA: Diagnosis not present

## 2023-03-08 DIAGNOSIS — E1122 Type 2 diabetes mellitus with diabetic chronic kidney disease: Secondary | ICD-10-CM | POA: Diagnosis not present

## 2023-03-08 LAB — RENAL FUNCTION PANEL
Albumin: 2.4 g/dL — ABNORMAL LOW (ref 3.5–5.0)
Anion gap: 8 (ref 5–15)
BUN: 33 mg/dL — ABNORMAL HIGH (ref 8–23)
CO2: 27 mmol/L (ref 22–32)
Calcium: 8.3 mg/dL — ABNORMAL LOW (ref 8.9–10.3)
Chloride: 99 mmol/L (ref 98–111)
Creatinine, Ser: 2.25 mg/dL — ABNORMAL HIGH (ref 0.44–1.00)
GFR, Estimated: 22 mL/min — ABNORMAL LOW (ref >60)
Glucose, Bld: 91 mg/dL (ref 70–99)
Phosphorus: 3.2 mg/dL (ref 2.5–4.6)
Potassium: 3.9 mmol/L (ref 3.5–5.1)
Sodium: 134 mmol/L — ABNORMAL LOW (ref 135–145)

## 2023-03-08 LAB — MAGNESIUM: Magnesium: 2.1 mg/dL (ref 1.7–2.4)

## 2023-03-08 LAB — GLUCOSE, CAPILLARY
Glucose-Capillary: 110 mg/dL — ABNORMAL HIGH (ref 70–99)
Glucose-Capillary: 115 mg/dL — ABNORMAL HIGH (ref 70–99)
Glucose-Capillary: 137 mg/dL — ABNORMAL HIGH (ref 70–99)
Glucose-Capillary: 97 mg/dL (ref 70–99)

## 2023-03-08 MED ORDER — AMOXICILLIN-POT CLAVULANATE 500-125 MG PO TABS: 1.0000 | ORAL_TABLET | Freq: Two times a day (BID) | ORAL | 0 refills | Status: AC

## 2023-03-08 MED ORDER — ASCORBIC ACID 500 MG PO TABS: 250.0000 mg | ORAL_TABLET | Freq: Every day | ORAL | Status: DC

## 2023-03-08 NOTE — Progress Notes (Signed)
PHARMACY CONSULT NOTE  Pharmacy Consult for Electrolyte Monitoring and Replacement   Recent Labs: Potassium (mmol/L)  Date Value  03/08/2023 3.9  08/11/2013 3.3 (L)   Magnesium (mg/dL)  Date Value  13/24/4010 2.1   Calcium (mg/dL)  Date Value  27/25/3664 8.3 (L)   Calcium, Total (mg/dL)  Date Value  40/34/7425 9.2   Albumin (g/dL)  Date Value  95/63/8756 2.4 (L)  08/11/2013 3.1 (L)   Phosphorus (mg/dL)  Date Value  43/32/9518 3.2   Sodium (mmol/L)  Date Value  03/08/2023 134 (L)  08/11/2013 138   Assessment: 72 y.o. 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   Goal of Therapy:  Electrolytes within normal limits  Plan:  --No replacement currently indicated --Re-check electrolytes in AM  Bettey Costa, PharmD Clinical Pharmacist 03/08/2023 7:47 AM

## 2023-03-08 NOTE — TOC Progression Note (Signed)
Transition of Care [facility]) - Progression Note    Patient Details  Name: Alexandra Foster MRN: [id number] Date of Birth: 1950/10/11  Transition of Care [facility]) CM/SW Contact  Liliana Cline, LCSW Phone Number: 03/08/2023, 9:54 AM  Clinical Narrative:    Attempted call to patient's daughter regarding DC home today per MD. Left another VM requesting a return call.         Expected Discharge Plan and Services         Expected Discharge Date: 03/08/23                                     Social Determinants of Health (SDOH) Interventions SDOH Screenings   Food Insecurity: No Food Insecurity (02/28/2023)  Housing: Patient Declined (03/04/2023)  Recent Concern: Housing - High Risk (12/06/2022)  Transportation Needs: No Transportation Needs (03/04/2023)  Utilities: Not At Risk (03/04/2023)  Alcohol Screen: Low Risk  (02/11/2021)  Depression (PHQ2-9): Medium Risk (02/27/2023)  Financial Resource Strain: Medium Risk (08/09/2020)  Physical Activity: Inactive (08/09/2020)  Social Connections: Moderately Integrated (08/09/2020)  Stress: No Stress Concern Present (08/09/2020)  Tobacco Use: Medium Risk (02/27/2023)    Readmission Risk Interventions    02/16/2023   12:26 PM 12/26/2022   11:15 AM  Readmission Risk Prevention Plan  Transportation Screening Complete Complete  PCP or Specialist Appt within 3-5 Days Complete   HRI or Home Care Consult Complete   Social Work Consult for Recovery Care Planning/Counseling Complete   Palliative Care Screening Not Applicable   Medication Review Oceanographer) Complete Complete  PCP or Specialist appointment within 3-5 days of discharge  Complete  HRI or Home Care Consult  Complete  SW Recovery Care/Counseling Consult  Complete  Palliative Care Screening  Not Applicable  Skilled Nursing Facility  Not Applicable

## 2023-03-08 NOTE — Plan of Care (Signed)
  Problem: Fluid Volume: Goal: Hemodynamic stability will improve Outcome: Adequate for Discharge   Problem: Clinical Measurements: Goal: Diagnostic test results will improve Outcome: Adequate for Discharge Goal: Signs and symptoms of infection will decrease Outcome: Adequate for Discharge   Problem: Education: Goal: Knowledge of General Education information will improve Description: Including pain rating scale, medication(s)/side effects and non-pharmacologic comfort measures Outcome: Adequate for Discharge   Problem: Health Behavior/Discharge Planning: Goal: Ability to manage health-related needs will improve Outcome: Adequate for Discharge   Problem: Clinical Measurements: Goal: Ability to maintain clinical measurements within normal limits will improve Outcome: Adequate for Discharge Goal: Will remain free from infection Outcome: Adequate for Discharge Goal: Diagnostic test results will improve Outcome: Adequate for Discharge Goal: Cardiovascular complication will be avoided Outcome: Adequate for Discharge   Problem: Activity: Goal: Risk for activity intolerance will decrease Outcome: Adequate for Discharge   Problem: Nutrition: Goal: Adequate nutrition will be maintained Outcome: Adequate for Discharge   Problem: Coping: Goal: Level of anxiety will decrease Outcome: Adequate for Discharge   Problem: Elimination: Goal: Will not experience complications related to bowel motility Outcome: Adequate for Discharge Goal: Will not experience complications related to urinary retention Outcome: Adequate for Discharge   Problem: Pain Management: Goal: General experience of comfort will improve Outcome: Adequate for Discharge   Problem: Safety: Goal: Ability to remain free from injury will improve Outcome: Adequate for Discharge   Problem: Skin Integrity: Goal: Risk for impaired skin integrity will decrease Outcome: Adequate for Discharge

## 2023-03-08 NOTE — Progress Notes (Signed)
AVS packet reviewed with daughter, patient belongings gathered and taken home by daughter. EMS transported patient; this RN notified daughter of departure time by telephone.

## 2023-03-08 NOTE — TOC Transition Note (Addendum)
Transition of Care [facility]) - CM/SW Discharge Note   Patient Details  Name: Alexandra Foster MRN: [id number] Date of Birth: Apr 10, 1951  Transition of Care [facility]) CM/SW Contact:  Liliana Cline, LCSW Phone Number: 03/08/2023, 10:42 AM   Clinical Narrative:    Patient to DC home today. Notified Heather with [facility], they will follow for PT, OT, and RN services. CSW left VMs for daughter requesting return call. RN Lurena Joiner was able to reach daughter who confirmed address and stated patient can come any time after 12 noon. Requested daughter call CSW back with any needs prior to DC.  EMS paperwork completed.  11:05- Return call received from daughter [name].  She confirms plan for [facility] and DC to address on file. She states she wants patient's lungs to be listened to again before she DC today, RN has notified MD.  [name] denies other TOC needs prior to DC.   12:25- Notified by RN that patient Is ready for transport. Called [facility], patient is next on the list pending truck availability.   Final next level of care: Home w Home Health Services Barriers to Discharge: Barriers Resolved   Patient Goals and CMS Choice      Discharge Placement                    Name of family member notified: left VMs x 2 for daughter Patient and family notified of of transfer: 03/08/23  Discharge Plan and Services Additional resources added to the After Visit Summary for                            [facility] Arranged: PT, OT, RN [facility] Agency: [facility] Date HH Agency Contacted: 03/08/23   Representative spoke with at [facility] Agency: Herbert Seta  Social Determinants of Health (SDOH) Interventions SDOH Screenings   Food Insecurity: No Food Insecurity (02/28/2023)  Housing: Patient Declined (03/04/2023)  Recent Concern: Housing - High Risk (12/06/2022)  Transportation Needs: No Transportation Needs (03/04/2023)  Utilities: Not At Risk (03/04/2023)  Alcohol Screen:  Low Risk  (02/11/2021)  Depression (PHQ2-9): Medium Risk (02/27/2023)  Financial Resource Strain: Medium Risk (08/09/2020)  Physical Activity: Inactive (08/09/2020)  Social Connections: Moderately Integrated (08/09/2020)  Stress: No Stress Concern Present (08/09/2020)  Tobacco Use: Medium Risk (02/27/2023)     Readmission Risk Interventions    02/16/2023   12:26 PM 12/26/2022   11:15 AM  Readmission Risk Prevention Plan  Transportation Screening Complete Complete  PCP or Specialist Appt within 3-5 Days Complete   HRI or Home Care Consult Complete   Social Work Consult for Recovery Care Planning/Counseling Complete   Palliative Care Screening Not Applicable   Medication Review Oceanographer) Complete Complete  PCP or Specialist appointment within 3-5 days of discharge  Complete  HRI or Home Care Consult  Complete  SW Recovery Care/Counseling Consult  Complete  Palliative Care Screening  Not Applicable  Skilled Nursing Facility  Not Applicable

## 2023-03-08 NOTE — Plan of Care (Signed)
Problem: Fluid Volume: Goal: Hemodynamic stability will improve Outcome: Progressing   Problem: Clinical Measurements: Goal: Diagnostic test results will improve Outcome: Progressing Goal: Signs and symptoms of infection will decrease Outcome: Progressing

## 2023-03-08 NOTE — Discharge Summary (Signed)
Physician Discharge Summary   Patient: Alexandra Foster MRN: [id number] DOB: [birth year]  Admit date:     02/27/2023  Discharge date: 03/08/23  Discharge Physician: Delfino Lovett   PCP: [name], MD   Recommendations at discharge:    F/up with outpt providers as requested  Discharge Diagnoses: Principal Problem:   Dyspnea on exertion Active Problems:   Type 2 diabetes mellitus with chronic kidney disease, with long-term current use of insulin (HCC)   ESRD on hemodialysis (HCC)   OSA (obstructive sleep apnea)   Osteopenia   Anxiety and depression   Dysphagia   Malnutrition (HCC)   History of necrotizing fasciitis   Adrenal insufficiency (HCC)   Gastrostomy tube in place (HCC)   Hx of AKA (above knee amputation), left (HCC)   Truncal obesity   Dyspnea  Hospital Course: Assessment and Plan:  72 y.o with significant PMH of Asthma, COPD, type diabetes mellitus, s/p G-tube, GERD, ESRD on HD(TuThSa), hypertension, osteoarthritis, paroxysmal atrial fibrillation and OSA on CPAP who was sent to the ED from her PCP's office for evaluation of possible sepsis. Patient had multiple prior admissions, recent septic shock due to MRSA bacteremia from infected PermCath, Enterococcus UTI.  Patient got vancomycin therapy for 10 days after new PermCath with insertion.    Patient was initially admitted to hospitalist service with impression of possible sepsis, started on broad-spectrum antibiotics cefepime, vancomycin.  On 03/01/2023 patient became hypotensive during hemodialysis, ICU team consulted who started him on vasopressor, stress to steroids, midodrine.  Patient's overall prognosis is poor, ICU attending discussed with patient's family regarding goals of care, she has made DNI but family wants CPR to be done.  Patient's blood pressure is better, tolerated hemodialysis transferred to [facility] service.   11/20 - Palliative care requested for f/up for GOC, transfer to floors. IR assessment for  PEG clogging 11/21: Family is not ready to take her back and not quite open for goals of care conversation either 11/22-11/23: Completed IV antibiotics.  Daughter does not want to have her leave till Sunday.  Chest x-ray showing no acute changes   Assessment and Plan: Septic shock Aspiration pneumonia - remains at high risk for recurrent aspirations Chronic infected decubitus ulcers Recent history of MRSA bacteremia status post removal of cath 8/24. Completed IV Zosyn course. Sent home on PO Augmentin for 5 more days per family request Patient's family declined TEE given high risk of respiratory failure due to sedative medications during procedure.   Chronic hypotension. Patient does have history of adrenal insufficiency. Continue midodrine 20 mg 3 times per day. Continue Florinef therapy.   ESRD on HD Continue hemodialysis per nephrology team.   Diarrhea- chronic.   Hyponatremia Hypokalemia Hypophosphatemia repleted   Anemia of Chronic disease- Hemoglobin stable No active bleeding at this time. Continue erythropoietin as per nephrology.   Paroxysmal atrial fibrillation- COPD OSA on CPAP Anxiety and depression: Stable on home meds   History of left AKA Pressure injury of skin Dysphagia status post G-tube in place- G tube malfunction/clogging Continue tube feeds IR evaluated the G Tube on 11/20 - both ports are flushing now.   Obesity BMI 32.66     She remains at very high risk for readmissions. I had tried to engage palliative care but family isn't receptive.  Consultants: Nephro, IR, Surgery, Palliative care, Vascular Surgery Disposition: Home health Diet recommendation:  Discharge Diet Orders (From admission, onward)     Start     Ordered   03/08/23 0000  Diet - low sodium heart healthy        03/08/23 0943           Carb modified diet DISCHARGE MEDICATION: Allergies as of 03/08/2023       Reactions   Chlorhexidine Dermatitis         Medication List     STOP taking these medications    amiodarone 200 MG tablet Commonly known as: PACERONE   diphenoxylate-atropine 2.5-0.025 MG tablet Commonly known as: LOMOTIL   polyvinyl alcohol 1.4 % ophthalmic solution Commonly known as: LIQUIFILM TEARS       TAKE these medications    Accu-Chek Guide test strip Generic drug: glucose blood Use as instructed   acetaminophen 325 MG tablet Commonly known as: TYLENOL Place 2 tablets (650 mg total) into feeding tube every 6 (six) hours as needed for mild pain (or Fever >/= 101).   amoxicillin-clavulanate 500-125 MG tablet Commonly known as: Augmentin Take 1 tablet by mouth 2 (two) times daily for 5 days.   ascorbic acid 500 MG tablet Commonly known as: VITAMIN C Place 0.5 tablets (250 mg total) into feeding tube daily. What changed:  how much to take when to take this   atorvastatin 20 MG tablet Commonly known as: LIPITOR Place 1 tablet (20 mg total) into feeding tube daily.   cholestyramine 4 g packet Commonly known as: QUESTRAN Place 1 packet (4 g total) into feeding tube 3 (three) times daily.   clonazePAM 0.5 MG tablet Commonly known as: KLONOPIN Take 0.5-1 tablets (0.25-0.5 mg total) by mouth daily as needed for anxiety.   escitalopram 10 MG tablet Commonly known as: LEXAPRO Place 1 tablet (10 mg total) into feeding tube daily.   famotidine 10 MG tablet Commonly known as: PEPCID Place 1 tablet (10 mg total) into feeding tube daily.   feeding supplement (NEPRO CARB STEADY) Liqd Place 1,120 mLs into feeding tube daily. What changed: Another medication with the same name was removed. Continue taking this medication, and follow the directions you see here.   fludrocortisone 0.1 MG tablet Commonly known as: FLORINEF Place 2 tablets (0.2 mg total) into feeding tube daily.   free water Soln Place 50 mLs into feeding tube every 4 (four) hours.   hydrOXYzine 50 MG tablet Commonly known as:  ATARAX Take 2 tablets (100 mg total) by mouth 2 (two) times daily. Home med.   liver oil-zinc oxide 40 % ointment Commonly known as: DESITIN Apply topically 3 (three) times daily as needed for irritation. Apply to skin around G tube 3 times daily as needed for skin irritation   metoCLOPramide 5 MG tablet Commonly known as: REGLAN Take 1 tablet (5 mg total) by mouth 3 (three) times daily before meals.   midodrine 10 MG tablet Commonly known as: PROAMATINE Place 2 tablets (20 mg total) into feeding tube 3 (three) times daily with meals.   multivitamin Tabs tablet Place 1 tablet into feeding tube at bedtime.   nystatin powder Commonly known as: MYCOSTATIN/NYSTOP Apply topically 3 (three) times daily.   traZODone 50 MG tablet Commonly known as: DESYREL Take 0.5 tablets (25 mg total) by mouth at bedtime as needed for sleep.   vitamin D3 25 MCG tablet Commonly known as: CHOLECALCIFEROL Place 2 tablets (2,000 Units total) into feeding tube daily.               Discharge Care Instructions  (From admission, onward)           Start  Ordered   03/08/23 0000  Discharge wound care:       Comments: As above   03/08/23 6578            Follow-up Information     [name], MD. Schedule an appointment as soon as possible for a visit in 1 week(s).   Specialty: Family Medicine Why: [facility] Discharge F/UP Contact information: [address] [place] [address] [phone]         Huston Foley, MD. Schedule an appointment as soon as possible for a visit in 2 week(s).   Specialties: Neurology, Radiology Why: [facility] Discharge F/UP Contact information: [address] [place] [address] [phone]         Vida Rigger, MD. Schedule an appointment as soon as possible for a visit in 2 week(s).   Specialty: Pulmonary Disease Why: [facility] Discharge F/UP Contact information: [address] [place] [address] [phone]                Discharge Exam: Ceasar Mons Weights   03/07/23 0704 03/07/23 0858 03/08/23 0446  Weight: 85.1 kg 82.5 kg 82.4 kg   General - Elderly chronically ill looking African American obese female, no apparent distress HEENT - PERRLA, EOMI, atraumatic head, non tender sinuses. Lung -clear to auscultation by laterally.  Decreased breath sounds at bases Heart - S1, S2 heard, no murmurs, rubs,  Abdomen - Soft, non tender, obese, PEG tube intact Neuro - Alert, awake and oriented, non focal exam. Skin - Warm and dry, left AKA Access: right chest permacath in place  Condition at discharge: fair  The results of significant diagnostics from this hospitalization (including imaging, microbiology, ancillary and laboratory) are listed below for reference.   Imaging Studies: US Venous Img Upper Uni Left (DVT)  Result Date: 03/07/2023 CLINICAL DATA:  Left upper extremity edema.  Evaluate for DVT. EXAM: LEFT UPPER EXTREMITY VENOUS DOPPLER ULTRASOUND TECHNIQUE: Gray-scale sonography with graded compression, as well as color Doppler and duplex ultrasound were performed to evaluate the upper extremity deep venous system from the level of the subclavian vein and including the jugular, axillary, basilic, radial, ulnar and upper cephalic vein. Spectral Doppler was utilized to evaluate flow at rest and with distal augmentation maneuvers. COMPARISON:  None Available. FINDINGS: Contralateral Subclavian Vein: Respiratory phasicity is normal and symmetric with the symptomatic side. No evidence of thrombus. Normal compressibility. Internal Jugular Vein: Not visualized due to bandages from an IV. Subclavian Vein: No evidence of thrombus. Normal compressibility, respiratory phasicity and response to augmentation. Axillary Vein: No evidence of thrombus. Normal compressibility, respiratory phasicity and response to augmentation. Cephalic Vein: There is hypoechoic occlusive  thrombus involving the cephalic vein at the level of the distal arm (image 11 and 31). The cephalic vein appears patent proximally and at its mid aspect Basilic Vein: No evidence of thrombus. Normal compressibility, respiratory phasicity and response to augmentation. Brachial Veins: No evidence of thrombus. Normal compressibility, respiratory phasicity and response to augmentation. Radial Veins: No evidence of thrombus. Normal compressibility, respiratory phasicity and response to augmentation. Ulnar Veins: No evidence of thrombus. Normal compressibility, respiratory phasicity and response to augmentation. Other Findings:  None visualized. IMPRESSION: 1. No evidence of DVT within the left upper extremity. 2. Examination is positive for short-segment occlusive superficial thrombophlebitis involving the distal aspect of the cephalic vein. Electronically Signed   By: Simonne Come M.D.   On: 03/07/2023 17:39   DG Chest [facility]  Result Date: 03/06/2023 CLINICAL DATA:  Dyspnea. EXAM: PORTABLE CHEST 1 VIEW COMPARISON:  Chest x-ray dated March 01, 2023. FINDINGS: The patient is rotated to the right, limiting evaluation. Unchanged tunneled right internal jugular dialysis catheter and left internal jugular central venous catheter. Stable cardiomediastinal silhouette with normal heart size. Unchanged mild bibasilar atelectasis. No focal consolidation, pleural effusion, or pneumothorax. No acute osseous abnormality. IMPRESSION: 1. Unchanged mild bibasilar atelectasis. Electronically Signed   By: Obie Dredge M.D.   On: 03/06/2023 18:21   IR Radiologist Eval & Mgmt  Result Date: 03/04/2023 EXAM: NEW PATIENT OFFICE VISIT CHIEF COMPLAINT: See epic note HISTORY OF PRESENT ILLNESS: See epic note REVIEW OF SYSTEMS: See epic note PHYSICAL EXAMINATION: See epic note ASSESSMENT AND PLAN: See epic note Electronically Signed   By: Olive Bass M.D.   On: 03/04/2023 15:15   US Abdomen Limited RUQ (LIVER/GB)  Result  Date: 03/01/2023 CLINICAL DATA:  Elevated liver function tests EXAM: ULTRASOUND ABDOMEN LIMITED RIGHT UPPER QUADRANT COMPARISON:  02/27/2023 FINDINGS: Gallbladder: Shadowing gallstone layers dependently within the gallbladder, measuring 19 mm. No gallbladder wall thickening or pericholecystic fluid. Negative sonographic Murphy sign. Common bile duct: Diameter: 3 mm Liver: Coarsened liver echotexture with nodularity of the liver capsule consistent with cirrhosis. No focal parenchymal abnormality or intrahepatic duct dilation. Portal vein is patent on color Doppler imaging with normal direction of blood flow towards the liver. Other: None. IMPRESSION: 1. Cholelithiasis without evidence of acute cholecystitis. 2. Cirrhosis. Electronically Signed   By: Sharlet Salina M.D.   On: 03/01/2023 11:26   DG Chest Port 1 View  Result Date: 03/01/2023 CLINICAL DATA:  Central line placement EXAM: PORTABLE CHEST 1 VIEW COMPARISON:  02/27/2023 FINDINGS: Left internal jugular central line placement with the tip in the SVC. Right dialysis catheter is unchanged. No pneumothorax. Heart and mediastinal contours are within normal limits. No confluent opacities, effusions or overt edema. No acute bony abnormality. IMPRESSION: Left central line tip in the SVC.  No pneumothorax. Electronically Signed   By: Charlett Nose M.D.   On: 03/01/2023 01:19   DG Abd 1 View  Result Date: 02/28/2023 CLINICAL DATA:  Abdominal pain with leaking gastrostomy tube EXAM: ABDOMEN - 1 VIEW COMPARISON:  Abdominal radiograph dated 12/31/2022 FINDINGS: Patient is rotated to the right. Percutaneous gastrostomy tube projects over the right upper quadrant. Nonspecific bowel gas pattern. Large volume stool within the ascending colon. No free air or pneumatosis. No abnormal radio-opaque calculi or mass effect. No acute or substantial osseous abnormality. The sacrum and coccyx are partially obscured by overlying bowel contents. IVC filter in-situ. Partially  imaged central venous catheter tip projects over the superior cavoatrial junction. IMPRESSION: 1. Nonspecific bowel gas pattern. Large volume stool within the ascending colon. 2. Patient is rotated to the right. Percutaneous gastrostomy tube projects over the right upper quadrant. Electronically Signed   By: Agustin Cree M.D.   On: 02/28/2023 13:12   CT CHEST ABDOMEN PELVIS WO CONTRAST  Result Date: 02/27/2023 CLINICAL DATA:  Recent sepsis, discharge from hospital 02/20/2023 EXAM: CT CHEST, ABDOMEN AND PELVIS WITHOUT CONTRAST TECHNIQUE: Multidetector CT imaging of the chest, abdomen and pelvis was performed following the standard protocol without IV contrast. RADIATION DOSE REDUCTION: This exam was performed according to the departmental dose-optimization program which includes automated exposure control, adjustment of the mA and/or kV according to patient size and/or use of iterative reconstruction technique. COMPARISON:  02/13/2023, 12/10/2022 FINDINGS: CT CHEST FINDINGS Cardiovascular: Unenhanced imaging of the heart is stable without  pericardial effusion. Calcification of the mitral annulus. Right internal jugular dialysis catheter tip within the superior vena cava. Normal caliber of the thoracic aorta. Stable atherosclerosis of the aorta and coronary vasculature. Mediastinum/Nodes: No enlarged mediastinal, hilar, or axillary lymph nodes. Thyroid gland, trachea, and esophagus demonstrate no significant findings. Lungs/Pleura: Trace bilateral pleural effusions, left greater than right. Minimal dependent bilateral lower lobe atelectasis. No airspace disease or pneumothorax. Central airways are patent. There is a 6 x 7 mm right lower lobe pulmonary nodule reference image 77/4, unchanged since 12/10/2022. Musculoskeletal: No acute or destructive bony abnormalities. Reconstructed images demonstrate no additional findings. CT ABDOMEN PELVIS FINDINGS Hepatobiliary: Nodular contour of the liver consistent with  cirrhosis. No focal parenchymal abnormality identified on this limited unenhanced exam. Stable calcified gallstones without cholecystitis. Pancreas: Unremarkable unenhanced appearance. Spleen: Unremarkable unenhanced appearance. Adrenals/Urinary Tract: Multiple nonobstructing right renal calculi are identified, unchanged in size or number with largest in the upper pole measuring 14 mm. No left-sided calculi. No obstructive uropathy within either kidney. Numerous calcified phleboliths are seen along the course of the right gonadal vein, and should not be confused with ureteral calculi. Stomach/Bowel: No bowel obstruction or ileus. Normal appendix right lower quadrant. Diverticulosis of the sigmoid colon without diverticulitis. No bowel wall thickening or inflammatory change. Percutaneous gastrostomy tube tip within the gastric lumen, with no evidence of complication. Vascular/Lymphatic: Stable IVC filter. Stable aortic atherosclerosis. No pathologic adenopathy within the abdomen or pelvis. Reproductive: Status post hysterectomy. No adnexal masses. Other: No free fluid or free intraperitoneal gas. Small fat containing hiatal hernia. Musculoskeletal: No acute or destructive bony abnormalities. Reconstructed images demonstrate no additional findings. IMPRESSION: 1. Trace bilateral pleural effusions with minimal dependent lower lobe atelectasis. 2. Cirrhosis. 3. Stable nonobstructing right renal calculi. 4. Cholelithiasis without cholecystitis. 5. Sigmoid diverticulosis without diverticulitis. 6. 6 mm mean diameter right lower lobe pulmonary nodule, stable for 3 months. Non-contrast chest CT at 3-9 months is recommended to document 1 year stability. If the nodule is stable at time of repeat CT, then future CT at 15-21 months (from today's scan) is considered optional for low-risk patients, but is recommended for high-risk patients. This recommendation follows the consensus statement: Guidelines for Management of  Incidental Pulmonary Nodules Detected on CT Images: From the Fleischner Society 2017; Radiology 2017; 284:228-243. 7.  Aortic Atherosclerosis (ICD10-I70.0). Electronically Signed   By: Sharlet Salina M.D.   On: 02/27/2023 20:48   DG Chest Port 1 View  Result Date: 02/27/2023 CLINICAL DATA:  Possible sepsis.  Coarse crackles noted bilaterally. EXAM: PORTABLE CHEST 1 VIEW COMPARISON:  One-view chest x-ray 02/13/2023 FINDINGS: Patient is rotated to the right. The left IJ catheter was removed. A new right IJ dialysis catheter is in place. The distal tip is at the cavoatrial junction. The heart size is normal. Lung volumes are low. Left basilar airspace opacity is noted. No focal airspace disease is present in the right. No edema or effusion is present. The visualized soft tissues and bony thorax are unremarkable. IMPRESSION: 1. Low lung volumes with left basilar airspace disease concerning for pneumonia. 2. New right IJ dialysis catheter without complicating features. Electronically Signed   By: Marin Roberts M.D.   On: 02/27/2023 17:52   PERIPHERAL VASCULAR CATHETERIZATION  Result Date: 02/16/2023 See surgical note for result.  ECHOCARDIOGRAM COMPLETE  Result Date: 02/15/2023    ECHOCARDIOGRAM REPORT   Patient Name:   Alexandra Foster Date of Exam: 02/15/2023 Medical Rec #:  [id number]  Height:       64.0 in Accession #:    [id number]          Weight:       187.6 lb Date of Birth:  04/26/1950           BSA:          1.904 m Patient Age:    72 years            BP:           93/33 mmHg Patient Gender: F                   HR:           60 bpm. Exam Location:  [facility] Procedure: 2D Echo, Cardiac Doppler and Color Doppler Indications:     Dyspnea R06.00  History:         Patient has prior history of Echocardiogram examinations, most                  recent 12/07/2022. COPD and ESRD; Risk Factors:Former Smoker and                  Sleep Apnea.  Sonographer:     Dondra Prader RVT RCS Referring Phys:   Raechel Chute Diagnosing Phys: Jodelle Red MD  Sonographer Comments: Dyspnea R06.00 IMPRESSIONS  1. Left ventricular ejection fraction, by estimation, is 60 to 65%. The left ventricle has normal function. The left ventricle has no regional wall motion abnormalities. Left ventricular diastolic parameters are indeterminate.  2. Right ventricular systolic function is normal. The right ventricular size is normal. There is mildly elevated pulmonary artery systolic pressure. The estimated right ventricular systolic pressure is 37.8 mmHg.  3. Left atrial size was mildly dilated.  4. The mitral valve is normal in structure. Trivial mitral valve regurgitation. No evidence of mitral stenosis. Moderate to severe mitral annular calcification.  5. The aortic valve is tricuspid. There is mild calcification of the aortic valve. There is mild thickening of the aortic valve. Aortic valve regurgitation is trivial. Aortic valve sclerosis/calcification is present, without any evidence of aortic stenosis.  6. The inferior vena cava is normal in size with greater than 50% respiratory variability, suggesting right atrial pressure of 3 mmHg. Comparison(s): No significant change from prior study. Conclusion(s)/Recommendation(s): Otherwise normal echocardiogram, with minor abnormalities described in the report. FINDINGS  Left Ventricle: Left ventricular ejection fraction, by estimation, is 60 to 65%. The left ventricle has normal function. The left ventricle has no regional wall motion abnormalities. The left ventricular internal cavity size was normal in size. There is  no left ventricular hypertrophy. Left ventricular diastolic parameters are indeterminate. Right Ventricle: The right ventricular size is normal. Right vetricular wall thickness was not well visualized. Right ventricular systolic function is normal. There is mildly elevated pulmonary artery systolic pressure. The tricuspid regurgitant velocity  is 2.95 m/s, and with  an assumed right atrial pressure of 3 mmHg, the estimated right ventricular systolic pressure is 37.8 mmHg. Left Atrium: Left atrial size was mildly dilated. Right Atrium: Right atrial size was normal in size. Pericardium: There is no evidence of pericardial effusion. Mitral Valve: The mitral valve is normal in structure. Moderate to severe mitral annular calcification. Trivial mitral valve regurgitation. No evidence of mitral valve stenosis. Tricuspid Valve: The tricuspid valve is normal in structure. Tricuspid valve regurgitation is mild . No evidence of tricuspid stenosis. Aortic Valve: The aortic valve is tricuspid. There is mild  calcification of the aortic valve. There is mild thickening of the aortic valve. Aortic valve regurgitation is trivial. Aortic valve sclerosis/calcification is present, without any evidence of aortic stenosis. Aortic valve mean gradient measures 7.0 mmHg. Aortic valve peak gradient measures 14.1 mmHg. Aortic valve area, by VTI measures 2.92 cm. Pulmonic Valve: The pulmonic valve was grossly normal. Pulmonic valve regurgitation is mild. No evidence of pulmonic stenosis. Aorta: The aortic root, ascending aorta and aortic arch are all structurally normal, with no evidence of dilitation or obstruction. Venous: The inferior vena cava is normal in size with greater than 50% respiratory variability, suggesting right atrial pressure of 3 mmHg. IAS/Shunts: The atrial septum is grossly normal.  LEFT VENTRICLE PLAX 2D LVIDd:         5.35 cm   Diastology LVIDs:         3.60 cm   LV e' medial:    6.16 cm/s LV PW:         0.90 cm   LV E/e' medial:  11.7 LV IVS:        0.85 cm   LV e' lateral:   10.10 cm/s LVOT diam:     2.30 cm   LV E/e' lateral: 7.2 LV SV:         133 LV SV Index:   70 LVOT Area:     4.15 cm  RIGHT VENTRICLE             IVC RV S prime:     14.40 cm/s  IVC diam: 1.20 cm TAPSE (M-mode): 2.7 cm LEFT ATRIUM             Index        RIGHT ATRIUM           Index LA diam:        3.40 cm  1.79 cm/m   RA Area:     14.00 cm LA Vol (A2C):   39.9 ml 20.96 ml/m  RA Volume:   31.40 ml  16.49 ml/m LA Vol (A4C):   59.9 ml 31.46 ml/m LA Biplane Vol: 50.2 ml 26.37 ml/m  AORTIC VALVE                     PULMONIC VALVE AV Area (Vmax):    2.92 cm      PV Vmax:          1.21 m/s AV Area (Vmean):   2.89 cm      PV Peak grad:     5.9 mmHg AV Area (VTI):     2.92 cm      PR End Diast Vel: 5.86 msec AV Vmax:           188.00 cm/s AV Vmean:          124.000 cm/s AV VTI:            0.455 m AV Peak Grad:      14.1 mmHg AV Mean Grad:      7.0 mmHg LVOT Vmax:         132.00 cm/s LVOT Vmean:        86.200 cm/s LVOT VTI:          0.320 m LVOT/AV VTI ratio: 0.70  AORTA Ao Asc diam: 2.80 cm MITRAL VALVE                TRICUSPID VALVE MV Area (PHT): 2.37 cm     TR Peak grad:   34.8 mmHg  MV Decel Time: 320 msec     TR Vmax:        295.00 cm/s MV E velocity: 72.30 cm/s MV A velocity: 103.00 cm/s  SHUNTS MV E/A ratio:  0.70         Systemic VTI:  0.32 m                             Systemic Diam: 2.30 cm Jodelle Red MD Electronically signed by Jodelle Red MD Signature Date/Time: 02/15/2023/1:37:51 PM    Final    DG Chest 1 View  Result Date: 02/13/2023 CLINICAL DATA:  Congestive heart failure EXAM: CHEST  1 VIEW COMPARISON:  12/25/2022 FINDINGS: Left central venous catheter with tip over the right atrium. Shallow inspiration. Cardiac enlargement. No vascular congestion, edema, or consolidation. No pleural effusions. No pneumothorax. Mediastinal contours appear intact. Degenerative changes in the spine. IMPRESSION: Shallow inspiration. Cardiac enlargement. No evidence of active pulmonary disease. Electronically Signed   By: Burman Nieves M.D.   On: 02/13/2023 18:15   CT CHEST ABDOMEN PELVIS WO CONTRAST  Result Date: 02/13/2023 CLINICAL DATA:  Sepsis.  Mental status changes. EXAM: CT CHEST, ABDOMEN AND PELVIS WITHOUT CONTRAST TECHNIQUE: Multidetector CT imaging of the chest, abdomen and pelvis  was performed following the standard protocol without IV contrast. RADIATION DOSE REDUCTION: This exam was performed according to the departmental dose-optimization program which includes automated exposure control, adjustment of the mA and/or kV according to patient size and/or use of iterative reconstruction technique. COMPARISON:  12/10/2022 FINDINGS: CT CHEST FINDINGS Cardiovascular: The heart size is normal. No substantial pericardial effusion. Left-sided central line tip is positioned in the right atrium Mediastinum/Nodes: No mediastinal lymphadenopathy. No evidence for gross hilar lymphadenopathy although assessment is limited by the lack of intravenous contrast on the current study. The esophagus has normal imaging features. There is no axillary lymphadenopathy. Lungs/Pleura: Architectural distortion is noted in the lungs bilaterally, right greater than left. Probable superimposed atelectasis in the dependent bases. Clustered nodular airspace disease in the anterior right lung (image 60/4) is new in the interval. No dense focal airspace consolidation. No pleural effusion. Musculoskeletal: No worrisome lytic or sclerotic osseous abnormality. CT ABDOMEN PELVIS FINDINGS Hepatobiliary: Nodular liver contour compatible cirrhosis. Calcified gallstones evident. No intrahepatic or extrahepatic biliary dilation. Pancreas: No focal mass lesion. No dilatation of the main duct. No intraparenchymal cyst. No peripancreatic edema. Spleen: No splenomegaly. No suspicious focal mass lesion. Adrenals/Urinary Tract: No adrenal nodule or mass. Nonobstructing stones noted right kidney. Left kidney unremarkable. No evidence for hydroureter. The urinary bladder appears normal for the degree of distention. Stomach/Bowel: Gastrostomy tube evident. There is gas along the course of the gastrostomy tube which may be anterior wall of the stomach herniating into the defect in the rectus sheath for passage of the tube (see coronal 67 and  68 of series 6 and axial 60/2) although a small extraluminal focus of gas related to the G-tube tract could also have this appearance. No focal rim enhancing fluid collection to suggest abscess. Duodenum is normally positioned as is the ligament of Treitz. No small bowel wall thickening. No small bowel dilatation. The terminal ileum is normal. The appendix is normal. No gross colonic mass. No colonic wall thickening. Diverticular changes are noted in the left colon without evidence of diverticulitis. Vascular/Lymphatic: There is mild atherosclerotic calcification of the abdominal aorta without aneurysm. IVC filter visualized. There is no gastrohepatic or hepatoduodenal ligament lymphadenopathy. No retroperitoneal or  mesenteric lymphadenopathy. No pelvic sidewall lymphadenopathy. Reproductive: There is no adnexal mass. Other: No intraperitoneal free fluid. Musculoskeletal: No worrisome lytic or sclerotic osseous abnormality. IMPRESSION: 1. Clustered nodular airspace disease in the anterior right lung is new in the interval, compatible with an infectious/inflammatory process. 2. Gastrostomy tube evident. There is gas along the course of the gastrostomy tube which may be anterior wall of the stomach herniating into the rectus sheath defect for the G tube although a small extraluminal focus of gas in the G-tube tract could also have this appearance. No focal rim enhancing fluid collection to suggest abscess. 3. Cirrhosis. 4. Cholelithiasis. 5. Nonobstructing right renal stones. 6. Left colonic diverticulosis without diverticulitis. 7.  Aortic Atherosclerosis (ICD10-I70.0). Electronically Signed   By: Kennith Center M.D.   On: 02/13/2023 06:59   CT Head Wo Contrast  Result Date: 02/13/2023 CLINICAL DATA:  Mental status change with unknown cause EXAM: CT HEAD WITHOUT CONTRAST TECHNIQUE: Contiguous axial images were obtained from the base of the skull through the vertex without intravenous contrast. RADIATION DOSE  REDUCTION: This exam was performed according to the departmental dose-optimization program which includes automated exposure control, adjustment of the mA and/or kV according to patient size and/or use of iterative reconstruction technique. COMPARISON:  Brain MRI 12/07/2022 FINDINGS: Brain: No evidence of acute infarction, hemorrhage, hydrocephalus, extra-axial collection or mass lesion/mass effect. Expanded and low-density sella, usually incidental in isolation. Generalized atrophy. Vascular: No hyperdense vessel or unexpected calcification. Skull: Normal. Negative for fracture or focal lesion. Sinuses/Orbits: No acute finding. IMPRESSION: No acute or interval finding. Electronically Signed   By: Tiburcio Pea M.D.   On: 02/13/2023 06:25    Microbiology: Results for orders placed or performed during the hospital encounter of 02/27/23  Blood Culture (routine x 2)     Status: None   Collection Time: 02/27/23  2:49 PM   Specimen: BLOOD LEFT ARM  Result Value Ref Range Status   Specimen Description BLOOD LEFT ARM  Final   Special Requests   Final    BOTTLES DRAWN AEROBIC AND ANAEROBIC Blood Culture adequate volume   Culture   Final    NO GROWTH 5 DAYS Performed at [facility], [address] [place], [place] [address]    Report Status 03/04/2023 FINAL  Final  Blood Culture (routine x 2)     Status: None   Collection Time: 02/28/23  6:21 AM   Specimen: BLOOD  Result Value Ref Range Status   Specimen Description BLOOD BLOOD RIGHT HAND  Final   Special Requests   Final    BOTTLES DRAWN AEROBIC AND ANAEROBIC Blood Culture results may not be optimal due to an inadequate volume of blood received in culture bottles   Culture   Final    NO GROWTH 5 DAYS Performed at [facility], [address] [place], [place] [address]    Report Status 03/05/2023 FINAL  Final  MRSA Next Gen by PCR, Nasal     Status: Abnormal   Collection Time: 03/03/23  8:47 AM   Specimen: Nasal Mucosa;  Nasal Swab  Result Value Ref Range Status   MRSA by PCR Next Gen DETECTED (A) NOT DETECTED Final    Comment: RESULT CALLED TO, READ BACK BY AND VERIFIED WITH: Cleda Clarks RN 1120 03/03/23 HNM (NOTE) The GeneXpert MRSA Assay (FDA approved for NASAL specimens only), is one component of a comprehensive MRSA colonization surveillance program. It is not intended to diagnose MRSA infection nor to guide or monitor  treatment for MRSA infections. Test performance is not FDA approved in patients less than 59 years old. Performed at [facility], [address] [place], [place] [address]     Labs: CBC: Recent Labs  Lab 03/03/23 1700 03/04/23 0434 03/05/23 0440 03/06/23 0438 03/07/23 0503  WBC 15.1* 12.5* 13.2* 14.8* 14.0*  HGB 10.1* 9.8* 9.5* 9.5* 9.5*  HCT 31.1* 30.5* 28.8* 29.1* 29.4*  MCV 79.9* 78.2* 79.6* 78.4* 78.6*  PLT 327 281 287 284 325   Basic Metabolic Panel: Recent Labs  Lab 03/02/23 0441 03/03/23 0415 03/03/23 1700 03/04/23 0434 03/05/23 0440 03/06/23 0438 03/07/23 0503 03/08/23 0443  NA  --  131* 130* 133* 134* 133* 135 134*  K  --  2.8* 4.0 3.6 3.7 3.4* 3.5 3.9  CL  --  97* 98 102 99 95* 99 99  CO2  --  27 26 24 25 28 28 27   GLUCOSE  --  101* 111* 97 99 104* 106* 91  BUN  --  34* 44* 31* 52* 28* 42* 33*  CREATININE  --  2.66* 3.20* 2.57* 3.59* 2.32* 3.56* 2.25*  CALCIUM  --  8.3* 8.4* 8.7* 9.1 9.0 8.9 8.3*  MG 2.5* 1.8  --  2.2 2.4  --   --  2.1  PHOS  --  1.8* 2.5 2.3* 3.5  --  3.4 3.2   Liver Function Tests: Recent Labs  Lab 03/02/23 0440 03/03/23 1700 03/07/23 0503 03/08/23 0443  ALBUMIN 2.3* 2.4* 2.5* 2.4*   CBG: Recent Labs  Lab 03/07/23 1952 03/07/23 2346 03/08/23 0426 03/08/23 0750 03/08/23 1150  GLUCAP 117* 115* 97 110* 137*    Discharge time spent: greater than 30 minutes.  Signed: Delfino Lovett, MD [facility] 03/08/2023

## 2023-03-09 ENCOUNTER — Other Ambulatory Visit: Payer: Self-pay | Admitting: Family Medicine

## 2023-03-09 DIAGNOSIS — Z931 Gastrostomy status: Secondary | ICD-10-CM

## 2023-03-09 NOTE — Telephone Encounter (Signed)
Copied from CRM [id number]. Topic: General - Other >> Mar 09, 2023 10:29 AM Macon Large wrote: Reason for CRM: Bonita Quin with [facility] reports that they received an order for palliative care and will be following. Cb# [phone]

## 2023-03-10 ENCOUNTER — Telehealth: Payer: Self-pay

## 2023-03-10 ENCOUNTER — Telehealth: Payer: Self-pay | Admitting: *Deleted

## 2023-03-10 ENCOUNTER — Ambulatory Visit: Payer: Self-pay | Admitting: *Deleted

## 2023-03-10 DIAGNOSIS — E1169 Type 2 diabetes mellitus with other specified complication: Secondary | ICD-10-CM

## 2023-03-10 DIAGNOSIS — J42 Unspecified chronic bronchitis: Secondary | ICD-10-CM

## 2023-03-10 MED ORDER — GLUCOSE BLOOD VI STRP: ORAL_STRIP | 12 refills | Status: DC

## 2023-03-10 NOTE — Telephone Encounter (Signed)
Shanda Bumps, from [facility], has called to let Dois Davenport know that their nurse is able to get out to the patient today, so patient will be checked out today 03/10/2023.  [facility] Callback [phone] OPTION 2

## 2023-03-10 NOTE — Telephone Encounter (Signed)
Called [facility] to see if [facility] nurse could evaluate rash that was expressed in telephone conversation from daughter Ardean Larsen. Awaiting return call for status update.

## 2023-03-10 NOTE — Transitions of Care (Post Inpatient/ED Visit) (Signed)
03/10/2023  Name: Alexandra Foster MRN: [id number] DOB: [birth year]  Today's TOC FU Call Status: Today's TOC FU Call Status:: Successful TOC FU Call Completed TOC FU Call Complete Date: 03/10/23 Patient's Name and Date of Birth confirmed.  Transition Care Management Follow-up Telephone Call Date of Discharge: 03/08/23 Discharge Facility: [facility]) Type of Discharge: Inpatient Admission Primary Inpatient Discharge Diagnosis:: DOE How have you been since you were released from the hospital?: Better Any questions or concerns?: Yes Patient Questions/Concerns:: Still nable ro get BG strips, Has significant rash covering buttocks, groin, area. Patient Questions/Concerns Addressed: Other: (reviewed meds added Augmentin as allergy/ intolerance, reports she has taken before amd it caused skin breakout/ rash, Call to PCP)  Items Reviewed: Did you receive and understand the discharge instructions provided?: Yes Medications obtained,verified, and reconciled?: Yes (Medications Reviewed) Any new allergies since your discharge?: Yes (Adding Augmentin due to suspected skin reactions) Dietary orders reviewed?: Yes Type of Diet Ordered:: feeding supplement  (NEPRO CARB STEADY)  Liqd  Place 1,120 mLs into feeding  tube daily. Do you have support at home?: Yes People in Home: spouse, child(ren), adult, sibling(s) Name of Support/Comfort Primary Source: Spouse [name], Daughter [name]  Medications Reviewed Today: Medications Reviewed Today     Reviewed by Johnnette Barrios, RN (Registered Nurse) on 03/10/23 at 1123  Med List Status: <None>   Medication Order Taking? Sig Documenting Provider Last Dose Status Informant  acetaminophen (TYLENOL) 325 MG tablet [id number] Yes Place 2 tablets (650 mg total) into feeding tube every 6 (six) hours as needed for mild pain (or Fever >/= 101). Alford Highland, MD Taking Active Care Giver, Child   amoxicillin-clavulanate (AUGMENTIN) 500-125 MG tablet [id number] Yes Take 1 tablet by mouth 2 (two) times daily for 5 days. Delfino Lovett, MD Taking Active   ascorbic acid (VITAMIN C) 500 MG tablet [id number] Yes Place 0.5 tablets (250 mg total) into feeding tube daily. Delfino Lovett, MD Taking Active   atorvastatin (LIPITOR) 20 MG tablet [id number] Yes Place 1 tablet (20 mg total) into feeding tube daily. [name], MD Taking Active Care Giver, Child  cholestyramine Lanetta Inch) 4 g packet [id number] Yes Place 1 packet (4 g total) into feeding tube 3 (three) times daily. [name], MD Taking Active Care Giver, Child  clonazePAM [facility]) 0.5 MG tablet [id number] Yes Take 0.5-1 tablets (0.25-0.5 mg total) by mouth daily as needed for anxiety. [name], MD Taking Active Care Giver, Child  escitalopram (LEXAPRO) 10 MG tablet [id number] Yes Place 1 tablet (10 mg total) into feeding tube daily. [name], MD Taking Active Care Giver, Child  famotidine (PEPCID) 10 MG tablet [id number] Yes Place 1 tablet (10 mg total) into feeding tube daily. [name], MD Taking Active Care Giver, Child  fludrocortisone (FLORINEF) 0.1 MG tablet [id number] Yes Place 2 tablets (0.2 mg total) into feeding tube daily. Alford Highland, MD Taking Active Care Giver, Child  glucose blood (ACCU-CHEK GUIDE) test strip [id number] No Use as instructed  Patient not taking: Reported on 03/10/2023   [name], MD Not Taking Active Child           Med Note Alexandra Foster, Alexandra Foster L   Tue Mar 10, 2023 11:07 AM) Needs specific order for 2 x day for strips to be sent   hydrOXYzine (ATARAX) 50 MG tablet [id number] Yes Take 2 tablets (100 mg total) by mouth 2 (two) times daily. Home med. Alexandra Priestly, MD Taking Active Child  liver oil-zinc oxide (DESITIN) 40 %  ointment [id number] Yes Apply topically 3 (three) times daily as needed for irritation. Apply to skin around G tube 3 times daily as needed for skin irritation Alexandra Priestly, MD  Taking Active Child  metoCLOPramide (REGLAN) 5 MG tablet [id number] Yes TAKE 1 TABLET BY MOUTH THREE TIMES DAILY BEFORE MEAL(S) [name], MD Taking Active   midodrine (PROAMATINE) 10 MG tablet [id number] Yes Place 2 tablets (20 mg total) into feeding tube 3 (three) times daily with meals. [name], MD Taking Active Care Giver, Child  multivitamin (RENA-VIT) TABS tablet [id number] Yes Place 1 tablet into feeding tube at bedtime. [name], MD Taking Active Care Giver, Child  Nutritional Supplements (FEEDING SUPPLEMENT, NEPRO CARB STEADY,) LIQD [id number] Yes Place 1,120 mLs into feeding tube daily. Enedina Finner, MD Taking Active Care Giver, Child  nystatin (MYCOSTATIN/NYSTOP) powder [id number] Yes Apply topically 3 (three) times daily. Alford Highland, MD Taking Active Care Giver, Child  traZODone (DESYREL) 50 MG tablet [id number] Yes Take 0.5 tablets (25 mg total) by mouth at bedtime as needed for sleep. [name], MD Taking Active Care Giver, Child  vitamin D3 (CHOLECALCIFEROL) 25 MCG tablet [id number] Yes Place 2 tablets (2,000 Units total) into feeding tube daily. Enedina Finner, MD Taking Active Care Giver, Child  Water For Irrigation, Sterile (FREE WATER) Criss Rosales [id number] Yes Place 50 mLs into feeding tube every 4 (four) hours. Alford Highland, MD Taking Active Care [place], Child  Med List Note Sharia Reeve, CPhT 12/06/22 1130): [facility] [phone]            Home Care and Equipment/Supplies: Were Home Health Services Ordered?: Yes Name of Home Health Agency:: [facility], PT/OT [phone] Has Agency set up a time to come to your home?: Yes First Home Health Visit Date: 03/11/23 (HH was unable ro come 11/25 due to altered Dialysis schedule) Any new equipment or medical supplies ordered?: No  Functional Questionnaire: Do you need assistance with bathing/showering or dressing?: Yes Do you need assistance with meal preparation?: Yes Do you need  assistance with eating?: Yes Do you have difficulty maintaining continence: Yes Do you need assistance with getting out of bed/getting out of a chair/moving?: Yes Do you have difficulty managing or taking your medications?: Yes  Follow up appointments reviewed: PCP Follow-up appointment confirmed?: Yes Date of PCP follow-up appointment?: 03/16/23 Follow-up Provider: Ames Dura, PA virtual visit Specialist Hospital Follow-up appointment confirmed?: No Reason Specialist Follow-Up Not Confirmed: Patient has Specialist Provider Number and will Call for Appointment (Needs to schedule Pulmonology and Cardiology Nephrology @ Dialysis T-Th Sat) Do you need transportation to your follow-up appointment?: Yes Transportation Need Intervention Addressed By:: AMB Referral For SDOH Needs (Uses DSS Transport, Virtual appts,) Do you understand care options if your condition(s) worsen?: Yes-patient verbalized understanding  SDOH Interventions Today    Flowsheet Row Most Recent Value  SDOH Interventions   Food Insecurity Interventions Intervention Not Indicated  Housing Interventions Intervention Not Indicated  Transportation Interventions Patient Resources (Friends/Family), AMB Referral  [Uses DSS, Needs home assist, Transport assist,]  Utilities Interventions Intervention Not Indicated       Goals Addressed             This Visit's Progress    TOC Care Plan       Current Barriers:  Care Coordination needs related to Transportation, Medication procurement, and ADL IADL limitations Chronic Disease Management support and education needs related to COPD and ESRD  Transportation barriers Difficulty obtaining medications  RNCM Clinical Goal(s):  Patient  will work with the Care Management team over the next 30 days to address Transition of Care Barriers: Medication Management Diet/Nutrition/Food Resources Support at home Provider appointments Home Health services Transportation take all  medications exactly as prescribed and will call provider for medication related questions as evidenced by medications are taken as prescribed, No missed medications, medications are available  attend all scheduled medical appointments: with PCP, Cardiology, Pulmonology and Nephrology  as evidenced by no missed appointments  demonstrate a decrease in COPD and ESRD exacerbations as evidenced by decreased ED / Urgent Care Visits   through collaboration with RN Care manager, provider, and care team.   Interventions: Evaluation of current treatment plan related to  self management and patient's adherence to plan as established by provider  Transitions of Care:  New goal. Doctor Visits  - discussed the importance of doctor visits Contacted provider for patient needs regarding suspected yeast rash/ skin allergy. Need for Glucose strips, and PCP appt. Contacted Health RN/OT/PT - [facility] [phone] for scheduling initial appt, and eval skin rash   Patient Goals/Self-Care Activities: Participate in Transition of Care Program/Attend TOC scheduled calls Take all medications as prescribed Attend all scheduled provider appointments Call pharmacy for medication refills 3-7 days in advance of running out of medications Call provider office for new concerns or questions  Work with the social worker to address care coordination needs and will continue to work with the clinical team to address health care and disease management related needs  Follow Up Plan:  The patient has been provided with contact information for the care management team and has been advised to call with any health related questions or concerns.         Routine follow-up and on-going assessment evaluation and education of disease processes, recommended interventions for both chronic and acute medical conditions , will occur during each weekly visit along with ongoing review of symptoms ,medication reviews and reconciliation. Any  updates , inconsistencies, discrepancies or acute care concerns will be addressed and routed to the correct Practitioner if indicated   Based on current information and Insurance plan -Reviewed benefits available to patient, including details about eligibility options for care if any area of needs were identified.  Reviewed patients ability to access and / or navigating the benefits system..Amb Referral made for SDOH needs  , refer to orders section of note for details   Please refer to Care Plan for goals and interventions -Effectiveness of interventions, symptom management and outcomes will be evaluated  weekly during [facility] 30-day Program Outreach calls  . Any necessary  changes and updates to Care Plan will be completed episodically    Reviewed goals for care Patient verbalizes understanding of instructions and care plan provided. Patient was encouraged to make informed decisions about their care, actively participate in managing their health condition, and implement lifestyle changes as needed to promote independence and self-management of health care  The patient has been provided with contact information for the care management team and has been advised to call with any health-related questions or concerns.   The patient has been provided with contact information for the care management team and has been advised to call with any health-related questions or concerns.   Susa Loffler , BSN, RN Care Management Coordinator [facility] [email or web address] Direct Dial: [phone]

## 2023-03-10 NOTE — Patient Instructions (Signed)
Visit Information  Thank you for taking time to visit with me today. Please don't hesitate to contact me if I can be of assistance to you before our next scheduled telephone appointment.  Our next appointment is by telephone on 12/2 at 2:30pm  Following is a copy of your care plan:   Goals Addressed             This Visit's Progress    TOC Care Plan       Current Barriers:  Care Coordination needs related to Transportation, Medication procurement, and ADL IADL limitations Chronic Disease Management support and education needs related to COPD and ESRD  Transportation barriers Difficulty obtaining medications  RNCM Clinical Goal(s):  Patient will work with the Care Management team over the next 30 days to address Transition of Care Barriers: Medication Management Diet/Nutrition/Food Resources Support at home Provider appointments Home Health services Transportation take all medications exactly as prescribed and will call provider for medication related questions as evidenced by medications are taken as prescribed, No missed medications, medications are available  attend all scheduled medical appointments: with PCP, Cardiology, Pulmonology and Nephrology  as evidenced by no missed appointments  demonstrate a decrease in COPD and ESRD exacerbations as evidenced by decreased ED / Urgent Care Visits   through collaboration with RN Care manager, provider, and care team.   Interventions: Evaluation of current treatment plan related to  self management and patient's adherence to plan as established by provider  Transitions of Care:  New goal. Doctor Visits  - discussed the importance of doctor visits Contacted provider for patient needs regarding suspected yeast rash/ skin allergy. Need for Glucose strips, and PCP appt. Contacted Health RN/OT/PT - [facility] [phone] for scheduling initial appt, and eval skin rash   Patient Goals/Self-Care Activities: Participate in Transition  of Care Program/Attend TOC scheduled calls Take all medications as prescribed Attend all scheduled provider appointments Call pharmacy for medication refills 3-7 days in advance of running out of medications Call provider office for new concerns or questions  Work with the social worker to address care coordination needs and will continue to work with the clinical team to address health care and disease management related needs  Follow Up Plan:  The patient has been provided with contact information for the care management team and has been advised to call with any health related questions or concerns.          Patient verbalizes understanding of instructions and care plan provided today and agrees to view in MyChart. Active MyChart status and patient understanding of how to access instructions and care plan via MyChart confirmed with patient.     The patient has been provided with contact information for the care management team and has been advised to call with any health related questions or concerns.   Please call the care guide team at [phone] if you need to cancel or reschedule your appointment.   Please call the Suicide and Crisis Lifeline: 988 call the [place] National Suicide Prevention Lifeline: 2690429633 or TTY: (719) 414-7142 TTY (508)363-0857) to talk to a trained counselor if you are experiencing a Mental Health or Behavioral Health Crisis or need someone to talk to.  Susa Loffler , BSN, RN Care Management Coordinator [place]   [facility] [email or web address] Direct Dial: [phone]

## 2023-03-10 NOTE — Telephone Encounter (Signed)
Summary: Rash and directions on test strips   Per agent: "Christie the Case Manager called asking for a nurse call to be made regarding a: Bad Rash the daughter reported and a change in the directions of glucose blood (ACCU-CHEK GUIDE) test strips, they need to have more specific directions, can not say "as needed" . Lorene Dy CM ask that you speak with daughter)"           Chief Complaint: Rash Symptoms: Diffuse red rash "With white areas."Area of buttock, between legs. States acquired in hospital, did not send anything to treat at discharge. Rash is painful Frequency:  Pertinent Negatives: Patient denies fever Disposition: [] ED /[] Urgent Care (no appt availability in office) / [] Appointment(In office/virtual)/ []  [place] [facility]/ [] Home Care/ [x] Refused Recommended Disposition /[] [place] [facility]/ []  Follow-up with PCP Additional Notes:   Initial call from CM. Pt is incontinent and wears depends. [facility]As hospital f/U appt already secured for Dec. 2nd. Advised should be seen sooner for this issue. States hard to do virtual related to rash as "I can't get any lifts so you wouldn't see the rash." Daughter declined appt.  Also needs clarification on test strips orders.  Assured daughter NT would route to practice for PCPs review and final disposition.   Reason for Disposition  [1] Localized rash is very painful AND [2] no fever  Answer Assessment - Initial Assessment Questions 1. APPEARANCE of RASH: "Describe the rash."      Bright red, with diffused "White places." 2. LOCATION: "Where is the rash located?"      Buttocks and between legs 3. NUMBER: "How many spots are there?"      Diffuse 4. SIZE: "How big are the spots?" (Inches, centimeters or compare to size of a coin)      No 5. ONSET: "When did the rash start?"      In hospital 6. ITCHING: "Does the rash itch?" If Yes, ask: "How bad is the itch?"  (Scale 0-10; or none, mild, moderate, severe)     Yes 7. PAIN: "Does  the rash hurt?" If Yes, ask: "How bad is the pain?"  (Scale 0-10; or none, mild, moderate, severe)    - NONE (0): no pain    - MILD (1-3): doesn't interfere with normal activities     - MODERATE (4-7): interferes with normal activities or awakens from sleep     - SEVERE (8-10): excruciating pain, unable to do any normal activities     Yes, varies 8. OTHER SYMPTOMS: "Do you have any other symptoms?" (e.g., fever)     no  Protocols used: Rash or Redness - Localized-A-AH

## 2023-03-10 NOTE — Progress Notes (Signed)
  Care Coordination   Note   03/10/2023 Name: Charlin Deantonio MRN: [id number] DOB: 03/20/1951  Dario Guardian Harkless is a 72 y.o. year old female who sees [name], MD for primary care. I reached out to [employer] by phone today to offer care coordination services.  Ms. Antonino was given information about Care Coordination services today including:   The Care Coordination services include support from the care team which includes your Nurse Coordinator, Clinical Social Worker, or Pharmacist.  The Care Coordination team is here to help remove barriers to the health concerns and goals most important to you. Care Coordination services are voluntary, and the patient may decline or stop services at any time by request to their care team member.   Care Coordination Consent Status: Patient agreed to services and verbal consent obtained.   Follow up plan:  Telephone appointment with care coordination team member scheduled for:  03/17/2023  Encounter Outcome:  Patient Scheduled from referral   Burman Nieves, [facility] Care Coordination Care Guide Direct Dial: [phone]

## 2023-03-10 NOTE — Telephone Encounter (Signed)
Changed and spoke with daughter. She will contact home health nurse to see if they can evaluate the rash and let us know otherwise.

## 2023-03-11 ENCOUNTER — Ambulatory Visit: Payer: Self-pay | Admitting: *Deleted

## 2023-03-11 NOTE — Telephone Encounter (Signed)
  Chief Complaint: Pam RN from [facility] # [phone], called to report skin issues, open wounds on patient skin . Not with patient now. Requesting wound care orders. Recent discharge from hospital 03/09/23 Symptoms: multiple skin issues. Open areas different sizes. No bleeding no drainage. Skin red . No fever. 1. Under right abdominal fold thin line open skin "like tiny spaghetti noodle , 2. Open area upper back, 3. 2 areas buttocks area, 4. Upper left thigh x 1 , 5. Right inner thigh.  Frequency: yesterday  Pertinent Negatives: Patient denies fever no drainage no bleeding no odor.  Disposition: [] ED /[] Urgent Care (no appt availability in office) / [] Appointment(In office/virtual)/ []  [facility]/ [] Home Care/ [] Refused Recommended Disposition /[] [place] [facility]/ [x]  Follow-up with PCP Additional Notes:   RN from [facility] requesting wound care orders and family requesting urinary catheter to be placed until wounds healed. Patient taking po augmentin until 03/13/23. Dressing applied over open areas temporary by [facility] RN until wound orders given. RN reports family refusing hospice or palliative care at this time patient is dialysis patient . Please advise if appt can be scheduled for patient . Patient is doing dialysis today .        Reason for Disposition  [1] Looks infected (spreading redness, pus) AND [2] no fever    Redness but does not look infected.  Answer Assessment - Initial Assessment Questions 1. APPEARANCE of INJURY: "What does the injury look like?"      Multiple open skin wounds noted after discharge from hospital 03/09/23, redness, no drainage no bleeding per Pam RN with [facility]. Not with patient now but last night  2. SIZE: "How large is the cut?"      Multiple sizes 3. BLEEDING: "Is it bleeding now?" If Yes, ask: "Is it difficult to stop?"      no 4. LOCATION: "Where is the injury located?"      Under right abdominal fold- right inner thigh,  left upper thigh, 2 areas on buttocks, 1 upper back  5. ONSET: "How long ago did the injury occur?"      unknown 6. MECHANISM: "Tell me how it happened."      Not sure was recently discharged from hospital 7. TETANUS: "When was the last tetanus booster?"     na 8. PREGNANCY: "Is there any chance you are pregnant?" "When was your last menstrual period?"     na  Protocols used: Skin Injury-A-AH

## 2023-03-12 ENCOUNTER — Other Ambulatory Visit: Payer: Self-pay | Admitting: Family Medicine

## 2023-03-12 DIAGNOSIS — F32A Depression, unspecified: Secondary | ICD-10-CM

## 2023-03-16 ENCOUNTER — Telehealth: Payer: Medicare Other | Admitting: Physician Assistant

## 2023-03-16 ENCOUNTER — Other Ambulatory Visit: Payer: Self-pay

## 2023-03-16 NOTE — Patient Outreach (Signed)
Care Management  Transitions of Care Program Transitions of Care Post-discharge week 3   03/16/2023 Name: Alexandra Foster MRN: [id number] DOB: November 25, 1950  Subjective: Alexandra Foster is a 72 y.o. year old female who is a primary care patient of [name], MD. The Care Management team Engaged with patient Engaged with patient by telephone to assess and address transitions of care needs.   Consent to Services:  Patient was given information about care management services, agreed to services, and gave verbal consent to participate.   Assessment:   Patient voices no new complaints Patient has not developed/ reported any new Medical issues / Dx or acute changes.- since last follow-up call for most recent  Hospital stay    11/1-11/8 / 2024 She continues with probable fungal rash to groin, buttocks. Followed by [facility] Nurse who suggested pressure injury. She is bed bound on an APP mattress. No significant change in area per daughter, using Nystatin powder, Silver dressing ( provided by [facility]) , frequent changes,. Patient had appt today w/ PCP, which was cancelled pee office they are ot able to perform virtual visits.  Amb appt for 12/3 - requested this be rescheduled as patient will be at Dialysis T-TH-Sat. She completed ABT. Question need to consider an allergy due to significant cutaneous reactions each time she has taken this medication. No medication changes, since last call.   Patient educated on red flag s/s to watch for and was encouraged to report, any changes in baseline or  medication regimen,  changes in health status  /  well-being, safety concerns  or any new unmanaged side effects or symptoms not relieved with interventions  to PCP and / or the  [facility] Case Management team          SDOH Interventions    Flowsheet Row Telephone from 03/10/2023 in [place] POPULATION HEALTH DEPARTMENT Telephone from 02/23/2023 in [place] POPULATION HEALTH DEPARTMENT Office Visit from  02/11/2023 in [facility] ED to Hosp-Admission (Discharged) from 12/05/2022 in [facility])  SDOH Interventions      Food Insecurity Interventions Intervention Not Indicated Intervention Not Indicated -- Patient Unable to Answer  Housing Interventions Intervention Not Indicated Intervention Not Indicated -- --  Transportation Interventions Patient Resources (Friends/Family), AMB Referral  [Uses DSS, Needs home assist, Transport assist,] Intervention Not Indicated, Patient Resources (Friends/Family), Payor Benefit -- --  Utilities Interventions Intervention Not Indicated Intervention Not Indicated -- --  Depression Interventions/Treatment  -- -- Currently on Treatment --        Goals Addressed             This Visit's Progress    TOC Care Plan       Current Barriers:  Care Coordination needs related to Transportation, Medication procurement, and ADL IADL limitations Chronic Disease Management support and education needs related to COPD and ESRD  Transportation barriers Difficulty obtaining medications  RNCM Clinical Goal(s):  Patient will work with the Care Management team over the next 30 days to address Transition of Care Barriers: Medication Management Diet/Nutrition/Food Resources Support at home Provider appointments Home Health services Transportation take all medications exactly as prescribed and will call provider for medication related questions as evidenced by medications are taken as prescribed, No missed medications, medications are available  attend all scheduled medical appointments: with PCP, Cardiology, Pulmonology and Nephrology  as evidenced by no missed appointments  work with social worker to address  related to the management of  Transportation, Inability to perform ADL's independently, and Inability to perform IADL's independently related to the management of ESRD as evidenced by review of EMR and  patient or social worker report demonstrate a decrease in COPD and ESRD exacerbations as evidenced by decreased ED / Urgent Care Visits   through collaboration with RN Care manager, provider, and care team.   Interventions: Evaluation of current treatment plan related to  self management and patient's adherence to plan as established by provider  Transitions of Care:  Goal on track:  Yes. and Provider cancelled virtual appt. Rescheduling Amb referral appt  Doctor Visits  - discussed the importance of doctor visits Contacted provider for patient needs regarding suspected yeast rash/ skin allergy. Need for Glucose strips, and PCP appt. Contacted Health RN/OT/PT - [facility] [phone] for scheduling initial appt, and eval skin rash   Patient Goals/Self-Care Activities: Participate in Transition of Care Program/Attend TOC scheduled calls Take all medications as prescribed Attend all scheduled provider appointments Call pharmacy for medication refills 3-7 days in advance of running out of medications Call provider office for new concerns or questions  Work with the social worker to address care coordination needs and will continue to work with the clinical team to address health care and disease management related needs  Follow Up Plan:  Telephone follow up appointment with care management team member scheduled for:  03/25/23 11:00am  The patient has been provided with contact information for the care management team and has been advised to call with any health related questions or concerns.        Routine follow-up and on-going assessment evaluation and education of disease processes, recommended interventions for both chronic and acute medical conditions , will occur during each weekly visit along with ongoing review of symptoms ,medication reviews and reconciliation. Any updates , inconsistencies, discrepancies or acute care concerns will be addressed and routed to the correct Practitioner  if indicated   Based on current information and Insurance plan -Reviewed benefits available to patient, including details about eligibility options for care if any area of needs were identified.  Reviewed patients ability to access and / or navigating the benefits system..Amb Referral made if indicted , refer to orders section of note for details   Please refer to Care Plan for goals and interventions -Effectiveness of interventions, symptom management and outcomes will be evaluated  weekly during [facility] 30-day Program Outreach calls  . Any necessary  changes and updates to Care Plan will be completed episodically    Reviewed goals for care Patient verbalizes understanding of instructions and care plan provided. Patient was encouraged to make informed decisions about their care, actively participate in managing their health condition, and implement lifestyle changes as needed to promote independence and self-management of health care    Plan: The patient has been provided with contact information for the care management team and has been advised to call with any health related questions or concerns.   Susa Loffler , BSN, RN Care Management Coordinator [place]   [facility] [email or web address] Direct Dial: [phone]

## 2023-03-17 ENCOUNTER — Telehealth: Payer: Self-pay | Admitting: Family Medicine

## 2023-03-17 NOTE — Telephone Encounter (Signed)
No answer from Angie, left detailed vm

## 2023-03-17 NOTE — Telephone Encounter (Signed)
Home Health Verbal Orders - Caller/Agency: Angie [facility]  Callback Number: [phone] Service Requested: Physical Therapy Frequency:   2w4  1w5  Any new concerns about the patient? No.

## 2023-03-20 ENCOUNTER — Encounter: Payer: Self-pay | Admitting: Physician Assistant

## 2023-03-20 ENCOUNTER — Ambulatory Visit (INDEPENDENT_AMBULATORY_CARE_PROVIDER_SITE_OTHER): Payer: Medicare Other | Admitting: Physician Assistant

## 2023-03-20 VITALS — BP 118/72 | HR 75 | Resp 18 | Ht 64.0 in

## 2023-03-20 DIAGNOSIS — Z09 Encounter for follow-up examination after completed treatment for conditions other than malignant neoplasm: Secondary | ICD-10-CM

## 2023-03-20 DIAGNOSIS — I6529 Occlusion and stenosis of unspecified carotid artery: Secondary | ICD-10-CM

## 2023-03-20 DIAGNOSIS — F419 Anxiety disorder, unspecified: Secondary | ICD-10-CM | POA: Diagnosis not present

## 2023-03-20 DIAGNOSIS — R0603 Acute respiratory distress: Secondary | ICD-10-CM | POA: Diagnosis not present

## 2023-03-20 DIAGNOSIS — F32A Depression, unspecified: Secondary | ICD-10-CM

## 2023-03-20 DIAGNOSIS — N186 End stage renal disease: Secondary | ICD-10-CM

## 2023-03-20 DIAGNOSIS — Z992 Dependence on renal dialysis: Secondary | ICD-10-CM

## 2023-03-20 MED ORDER — CHOLESTYRAMINE 4 G PO PACK: 4.0000 g | PACK | Freq: Three times a day (TID) | ORAL | 0 refills | Status: DC

## 2023-03-20 MED ORDER — ESCITALOPRAM OXALATE 10 MG PO TABS: 10.0000 mg | ORAL_TABLET | Freq: Every day | ORAL | 0 refills | Status: DC

## 2023-03-20 MED ORDER — RENA-VITE PO TABS: 1.0000 | ORAL_TABLET | Freq: Every day | ORAL | 0 refills | Status: DC

## 2023-03-20 MED ORDER — FLUDROCORTISONE ACETATE 0.1 MG PO TABS: 0.2000 mg | ORAL_TABLET | Freq: Every day | ORAL | 0 refills | Status: DC

## 2023-03-20 MED ORDER — ATORVASTATIN CALCIUM 20 MG PO TABS: 20.0000 mg | ORAL_TABLET | Freq: Every day | ORAL | 0 refills | Status: DC

## 2023-03-20 NOTE — Progress Notes (Unsigned)
Established Patient Office Visit  Name: Alexandra Foster   MRN: [id number]    DOB: Jun 09, 1950   Date:03/20/2023  Today's Provider: Jacquelin Hawking, MHS, PA-C Introduced myself to the patient as a PA-C and provided education on APPs in clinical practice.         Subjective  Chief Complaint  Chief Complaint  Patient presents with   Hospitalization Follow-up    Sepsis  feeling much better    HPI   Follow up for  hospital admission from 02/28/23-03/08/23  She reports she is feeling better than previously minus itching on her skin Her daughter reports that she is getting Hydroxyzine for the itching   Family reports she had her ports changed out and she seems to be doing better She states her breathing is better than previously   Goals of care conversation and prognosis had with patient and her daughter- they would like to continue with therapies- do not wish to pursue palliative or hospice care     Patient Active Problem List   Diagnosis Date Noted   Dyspnea 02/28/2023   Dyspnea on exertion 02/27/2023   Hx of AKA (above knee amputation), left (HCC) 02/27/2023   Truncal obesity 02/27/2023   Gastrostomy tube in place (HCC) 02/15/2023   Pressure injury of skin 02/15/2023   Septic shock (HCC) 02/14/2023   Aspiration pneumonia (HCC) 02/13/2023   Adrenal insufficiency (HCC) 02/13/2023   Malnutrition (HCC) 02/11/2023   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   Cholelithiasis without cholecystitis 02/11/2023   Bowel and bladder incontinence 02/11/2023   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   Diarrhea 12/28/2022   Dysphagia 12/26/2022   Anemia of chronic disease 12/24/2022   Anxiety and depression 12/06/2022   Type 2 diabetes mellitus with chronic kidney disease, with long-term current use of insulin (HCC) 12/06/2022   ESRD on hemodialysis (HCC) 12/06/2022   Subdural hemorrhage (HCC) 05/18/2021   Atrial fibrillation and flutter (HCC)     Sepsis (HCC) 05/04/2021   Personal history of COVID-19 07/14/2019   Osteoarthritis of both knees 01/14/2018   Urge incontinence 03/14/2016   Osteopenia 02/13/2016   OSA (obstructive sleep apnea) 01/08/2016   Allergic rhinitis 04/13/2015   COPD (chronic obstructive pulmonary disease) (HCC) 09/18/2014   Esophagitis, reflux 09/18/2014    Past Surgical History:  Procedure Laterality Date   ABDOMINAL HYSTERECTOMY     APPLICATION OF WOUND VAC Left 05/17/2021   Procedure: APPLICATION OF WOUND VAC/WOUND VAC EXCHANGE-Matrix Myriad;  Surgeon: Carolan Shiver, MD;  Location: [facility];  Service: General;  Laterality: Left;   APPLICATION OF WOUND VAC  05/24/2021   Procedure: APPLICATION OF WOUND VAC;  Surgeon: Carolan Shiver, MD;  Location: [facility];  Service: General;;   APPLICATION OF WOUND VAC  05/10/2021   Procedure: APPLICATION OF WOUND VAC;  Surgeon: Carolan Shiver, MD;  Location: [facility];  Service: General;;   COLONOSCOPY  10/29/2006   Dr Servando Snare   COLONOSCOPY WITH PROPOFOL N/A 11/05/2016   Procedure: COLONOSCOPY WITH PROPOFOL;  Surgeon: Earline Mayotte, MD;  Location: [facility];  Service: Endoscopy;  Laterality: N/A;   DIALYSIS/PERMA CATHETER INSERTION N/A 12/19/2022   Procedure: DIALYSIS/PERMA CATHETER INSERTION;  Surgeon: Annice Needy, MD;  Location: [facility];  Service: Cardiovascular;  Laterality: N/A;   DIALYSIS/PERMA CATHETER INSERTION N/A 02/04/2023   Procedure: DIALYSIS/PERMA CATHETER INSERTION;  Surgeon: Annice Needy, MD;  Location: [facility];  Service: Cardiovascular;  Laterality: N/A;   DIALYSIS/PERMA CATHETER INSERTION N/A 02/16/2023   Procedure: DIALYSIS/PERMA CATHETER INSERTION;  Surgeon: Annice Needy, MD;  Location: [facility];  Service: Cardiovascular;  Laterality: N/A;   INCISION AND DRAINAGE OF WOUND Left 05/24/2021   Procedure: IRRIGATION AND DEBRIDEMENT LEFT LEG;  Surgeon: Carolan Shiver, MD;  Location: [facility];   Service: General;  Laterality: Left;   INCISION AND DRAINAGE OF WOUND Left 05/10/2021   Procedure: IRRIGATION AND DEBRIDEMENT LEFT LEG;  Surgeon: Carolan Shiver, MD;  Location: [facility];  Service: General;  Laterality: Left;   IR FLUORO GUIDE CV LINE RIGHT  06/12/2021   IR PERC TUN PERIT CATH WO PORT S&I /IMAG  06/12/2021   IR RADIOLOGIST EVAL & MGMT  03/04/2023   IR REPLC GASTRO/COLONIC TUBE PERCUT W/FLUORO  06/12/2021   IVC FILTER INSERTION N/A 05/29/2021   Procedure: IVC FILTER INSERTION;  Surgeon: Renford Dills, MD;  Location: [facility];  Service: Cardiovascular;  Laterality: N/A;   MINOR GRAFT APPLICATION  05/24/2021   Procedure: Myriad Matrix  APPLICATION;  Surgeon: Carolan Shiver, MD;  Location: [facility];  Service: General;;   NASAL SINUS SURGERY  2002   Dr Chestine Spore   PEG PLACEMENT N/A 05/28/2021   Procedure: PERCUTANEOUS ENDOSCOPIC GASTROSTOMY (PEG) PLACEMENT;  Surgeon: Sung Amabile, DO;  Location: [facility];  Service: General;  Laterality: N/A;  TRAVEL CASE   TRACHEOSTOMY TUBE PLACEMENT N/A 05/17/2021   Procedure: TRACHEOSTOMY;  Surgeon: Geanie Logan, MD;  Location: [facility];  Service: ENT;  Laterality: N/A;   WOUND DEBRIDEMENT Left 05/07/2021   Procedure: DEBRIDEMENT WOUND;  Surgeon: Carolan Shiver, MD;  Location: [facility];  Service: General;  Laterality: Left;    Family History  Problem Relation Age of Onset   Congestive Heart Failure Mother    Coronary artery disease Father 35   Breast cancer Sister 7   Heart disease Brother    Varicose Veins Brother    Alcohol abuse Brother     Social History   Tobacco Use   Smoking status: Former    Current packs/day: 0.00    Average packs/day: 2.0 packs/day for 40.0 years (80.0 ttl pk-yrs)    Types: Cigarettes    Start date: 1963    Quit date: 2003    Years since quitting: 21.9   Smokeless tobacco: Former    Types: Snuff    Quit date: 04/2001   Tobacco comments:    smoking cessation materials not  required  Substance Use Topics   Alcohol use: No    Alcohol/week: 0.0 standard drinks of alcohol    No current facility-administered medications for this visit. No current outpatient medications on file.  Facility-Administered Medications Ordered in Other Visits:    0.9 %  sodium chloride infusion, , Intravenous, Continuous, Sheppard Plumber E, NP, Last Rate: 10 mL/hr at 03/23/23 1540, New Bag at 03/23/23 1540   diphenhydrAMINE (BENADRYL) injection 50 mg, 50 mg, Intravenous, Once PRN, Georgiana Spinner, NP   famotidine (PEPCID) tablet 40 mg, 40 mg, Oral, Once PRN, Georgiana Spinner, NP   fentaNYL (SUBLIMAZE) injection, , , PRN, Annice Needy, MD, 50 mcg at 03/23/23 1556   Heparin (Porcine) in NaCl 1000-0.9 UT/500ML-% SOLN, , , PRN, Annice Needy, MD, 500 mL at 03/23/23 1617   heparin injection, , , PRN, Annice Needy, MD, 10,000 Units at 03/23/23 1618   HYDROmorphone (DILAUDID) injection 1 mg, 1 mg, Intravenous, Once PRN, Georgiana Spinner, NP  methylPREDNISolone sodium succinate (SOLU-MEDROL) 125 mg/2 mL injection 125 mg, 125 mg, Intravenous, Once PRN, Georgiana Spinner, NP   midazolam (VERSED) 2 MG/ML syrup 8 mg, 8 mg, Oral, Once PRN, Georgiana Spinner, NP   midazolam (VERSED) injection, , , PRN, Annice Needy, MD, 1 mg at 03/23/23 1556   ondansetron (ZOFRAN) injection 4 mg, 4 mg, Intravenous, Q6H PRN, Georgiana Spinner, NP   vancomycin (VANCOCIN) IVPB 1000 mg/200 mL premix, 1,000 mg, Intravenous, 60 min Pre-Op, Sheppard Plumber E, NP, Last Rate: 200 mL/hr at 03/23/23 1557, 1,000 mg at 03/23/23 1557  Allergies  Allergen Reactions   Chlorhexidine Dermatitis   Augmentin [Amoxicillin-Pot Clavulanate] Dermatitis    I personally reviewed active problem list, medication list, notes from last encounter, lab results with the patient/caregiver today.   Review of Systems  Constitutional:  Positive for malaise/fatigue. Negative for chills and fever.  Respiratory:  Positive for cough. Negative for shortness of  breath and wheezing.   Cardiovascular:  Negative for chest pain.  Gastrointestinal:  Negative for nausea and vomiting.  Musculoskeletal:  Negative for falls.  Skin:  Positive for itching.  Neurological:  Negative for dizziness and loss of consciousness.      Objective  Vitals:   03/20/23 1249  BP: 118/72  Pulse: 75  Resp: 18  SpO2: 98%  Height: 5\' 4"  (1.626 m)    Body mass index is 31.18 kg/m.  Physical Exam Vitals reviewed.  Constitutional:      General: She is awake.     Appearance: Normal appearance. She is well-developed and well-groomed.  HENT:     Head: Normocephalic and atraumatic.  Cardiovascular:     Rate and Rhythm: Normal rate and regular rhythm.     Heart sounds: Heart sounds are distant.  Pulmonary:     Effort: Pulmonary effort is normal.     Breath sounds: Normal breath sounds. No decreased air movement. No decreased breath sounds, wheezing, rhonchi or rales.  Neurological:     Mental Status: She is alert.  Psychiatric:        Attention and Perception: Attention normal.        Speech: Speech is slurred.        Behavior: Behavior is cooperative.         PHQ2/9:    03/20/2023   12:49 PM 02/27/2023   12:48 PM 02/11/2023    2:39 PM 04/30/2021    8:05 AM 02/11/2021    8:53 AM  Depression screen PHQ 2/9  Decreased Interest 0 1 2 0 0  Down, Depressed, Hopeless 0 1 2 0 0  PHQ - 2 Score 0 2 4 0 0  Altered sleeping 0 1 3    Tired, decreased energy 0 1 3    Change in appetite 0 1 0    Feeling bad or failure about yourself  0 0 2    Trouble concentrating 0 0 0    Moving slowly or fidgety/restless 0 0 0    Suicidal thoughts 0 0 0    PHQ-9 Score 0 5 12    Difficult doing work/chores Not difficult at all Somewhat difficult Somewhat difficult        Fall Risk:    03/23/2023   11:26 AM 03/20/2023   12:49 PM 02/27/2023   12:48 PM 02/11/2023    1:49 PM 04/30/2021    8:04 AM  Fall Risk   Falls in the past year? 0 0 1 Exclusion - non ambulatory 0  Number falls in past yr:  0 1    Injury with Fall?  0 1    Risk for fall due to :   Impaired balance/gait;History of fall(s)    Follow up   Falls prevention discussed;Education provided;Falls evaluation completed Falls prevention discussed;Education provided;Falls evaluation completed Falls prevention discussed      Functional Status Survey: Is the patient deaf or have difficulty hearing?: No Does the patient have difficulty seeing, even when wearing glasses/contacts?: No Does the patient have difficulty concentrating, remembering, or making decisions?: No Does the patient have difficulty walking or climbing stairs?: Yes Does the patient have difficulty dressing or bathing?: Yes Does the patient have difficulty doing errands alone such as visiting a doctor's office or shopping?: Yes    Assessment & Plan  Problem List Items Addressed This Visit       Other   Anxiety and depression   Relevant Medications   escitalopram (LEXAPRO) 10 MG tablet   Dyspnea - Primary   Other Visit Diagnoses     Hospital discharge follow-up       Carotid artery plaque, unspecified laterality       Relevant Medications   cholestyramine (QUESTRAN) 4 g packet   atorvastatin (LIPITOR) 20 MG tablet   End-stage renal disease on hemodialysis [facility])          Patient presents today for hospitalization follow-up after she was sent to the hospital from previous office visit on 11-15 through 11/24 I reviewed her hospital discharge summary.  She and her family reports that she is feeling much better and her breathing has improved significantly. I reviewed with her daughter that it is very likely the patient will have continued complications and repeat hospitalizations in her current state. We did discuss the potential for palliative and hospice care but patient's daughter declined this today Will provide refills for requested medications Continue current medication regimen as directed Follow-up as needed for  progressing or persistent symptoms    Return in about 3 months (around 06/18/2023) for Follow up .   I, Naydelin Ziegler E Maximillian Habibi, PA-C, have reviewed all documentation for this visit. The documentation on 03/23/23 for the exam, diagnosis, procedures, and orders are all accurate and complete.   Jacquelin Hawking, MHS, PA-C [facility]

## 2023-03-23 ENCOUNTER — Ambulatory Visit: Payer: Self-pay

## 2023-03-23 ENCOUNTER — Encounter
Admission: AD | Disposition: A | Discharge status: Home / Self Care | Payer: Self-pay | Source: Ambulatory Visit | Attending: Vascular Surgery

## 2023-03-23 ENCOUNTER — Encounter: Payer: Self-pay | Admitting: Vascular Surgery

## 2023-03-23 ENCOUNTER — Ambulatory Visit
Admission: AD | Admit: 2023-03-23 | Discharge: 2023-03-23 | Disposition: A | Discharge status: Home / Self Care | Payer: Medicare Other | Source: Ambulatory Visit | Attending: Vascular Surgery | Admitting: Vascular Surgery

## 2023-03-23 ENCOUNTER — Other Ambulatory Visit: Payer: Self-pay

## 2023-03-23 DIAGNOSIS — Z992 Dependence on renal dialysis: Secondary | ICD-10-CM | POA: Insufficient documentation

## 2023-03-23 DIAGNOSIS — T859XXA Unspecified complication of internal prosthetic device, implant and graft, initial encounter: Secondary | ICD-10-CM | POA: Diagnosis present

## 2023-03-23 DIAGNOSIS — T8249XA Other complication of vascular dialysis catheter, initial encounter: Secondary | ICD-10-CM

## 2023-03-23 DIAGNOSIS — E1122 Type 2 diabetes mellitus with diabetic chronic kidney disease: Secondary | ICD-10-CM | POA: Diagnosis not present

## 2023-03-23 DIAGNOSIS — Z87891 Personal history of nicotine dependence: Secondary | ICD-10-CM | POA: Insufficient documentation

## 2023-03-23 DIAGNOSIS — N186 End stage renal disease: Secondary | ICD-10-CM | POA: Diagnosis not present

## 2023-03-23 HISTORY — PX: DIALYSIS/PERMA CATHETER INSERTION: CATH118288

## 2023-03-23 LAB — GLUCOSE, CAPILLARY: Glucose-Capillary: 91 mg/dL (ref 70–99)

## 2023-03-23 LAB — POTASSIUM (ARMC VASCULAR LAB ONLY): Potassium (ARMC vascular lab): 4.7 mmol/L (ref 3.5–5.1)

## 2023-03-23 SURGERY — DIALYSIS/PERMA CATHETER INSERTION
Anesthesia: Moderate Sedation

## 2023-03-23 MED ORDER — FENTANYL CITRATE (PF) 100 MCG/2ML IJ SOLN
INTRAMUSCULAR | Status: AC
  Filled 2023-03-23: qty 2

## 2023-03-23 MED ORDER — MIDAZOLAM HCL 2 MG/ML PO SYRP: 8.0000 mg | ORAL_SOLUTION | Freq: Once | ORAL | Status: DC | PRN

## 2023-03-23 MED ORDER — METHYLPREDNISOLONE SODIUM SUCC 125 MG IJ SOLR: 125.0000 mg | Freq: Once | INTRAMUSCULAR | Status: DC | PRN

## 2023-03-23 MED ORDER — HEPARIN SODIUM (PORCINE) 10000 UNIT/ML IJ SOLN
INTRAMUSCULAR | Status: AC
  Filled 2023-03-23: qty 1

## 2023-03-23 MED ORDER — HYDROMORPHONE HCL 1 MG/ML IJ SOLN: 1.0000 mg | Freq: Once | INTRAMUSCULAR | Status: DC | PRN

## 2023-03-23 MED ORDER — HEPARIN (PORCINE) IN NACL 1000-0.9 UT/500ML-% IV SOLN
INTRAVENOUS | Status: DC | PRN
  Administered 2023-03-23: 500 mL

## 2023-03-23 MED ORDER — VANCOMYCIN HCL IN DEXTROSE 1-5 GM/200ML-% IV SOLN
1000.0000 mg | INTRAVENOUS | Status: AC
  Administered 2023-03-23: 1000 mg via INTRAVENOUS

## 2023-03-23 MED ORDER — HEPARIN SODIUM (PORCINE) 10000 UNIT/ML IJ SOLN
INTRAMUSCULAR | Status: DC | PRN
  Administered 2023-03-23: 10000 [IU]

## 2023-03-23 MED ORDER — FENTANYL CITRATE (PF) 100 MCG/2ML IJ SOLN
INTRAMUSCULAR | Status: DC | PRN
  Administered 2023-03-23: 50 ug via INTRAVENOUS

## 2023-03-23 MED ORDER — MIDAZOLAM HCL 2 MG/2ML IJ SOLN
INTRAMUSCULAR | Status: DC | PRN
  Administered 2023-03-23: 1 mg via INTRAVENOUS

## 2023-03-23 MED ORDER — ONDANSETRON HCL 4 MG/2ML IJ SOLN: 4.0000 mg | Freq: Four times a day (QID) | INTRAMUSCULAR | Status: DC | PRN

## 2023-03-23 MED ORDER — DIPHENHYDRAMINE HCL 50 MG/ML IJ SOLN: 50.0000 mg | Freq: Once | INTRAMUSCULAR | Status: DC | PRN

## 2023-03-23 MED ORDER — SODIUM CHLORIDE 0.9 % IV SOLN: INTRAVENOUS | Status: DC

## 2023-03-23 MED ORDER — VANCOMYCIN HCL IN DEXTROSE 1-5 GM/200ML-% IV SOLN
INTRAVENOUS | Status: AC
  Filled 2023-03-23: qty 200

## 2023-03-23 MED ORDER — FAMOTIDINE 20 MG PO TABS: 40.0000 mg | ORAL_TABLET | Freq: Once | ORAL | Status: DC | PRN

## 2023-03-23 MED ORDER — MIDAZOLAM HCL 5 MG/5ML IJ SOLN
INTRAMUSCULAR | Status: AC
Start: 2023-03-23 — End: ?
  Filled 2023-03-23: qty 5

## 2023-03-23 SURGICAL SUPPLY — 5 items
BIOPATCH RED 1 DISK 7.0 (GAUZE/BANDAGES/DRESSINGS) IMPLANT
CATH PALINDROME-P 19CM W/VT (CATHETERS) IMPLANT
GUIDEWIRE SUPER STIFF .035X180 (WIRE) IMPLANT
PACK ANGIOGRAPHY (CUSTOM PROCEDURE TRAY) IMPLANT
SUT PROLENE 0 CT 1 30 (SUTURE) IMPLANT

## 2023-03-23 NOTE — Op Note (Signed)
OPERATIVE NOTE    PRE-OPERATIVE DIAGNOSIS: 1. ESRD 2. Non-functional permcath  POST-OPERATIVE DIAGNOSIS: same as above  PROCEDURE: Fluoroscopic guidance for placement of catheter Placement of a 19 cm tip to cuff tunneled hemodialysis catheter via the right internal jugular vein and removal of previous catheter  SURGEON: Festus Barren, MD  ANESTHESIA:  Local with moderate conscious sedation for 18 minutes using 1 mg of Versed and 50 mcg of Fentanyl  ESTIMATED BLOOD LOSS: 3 cc  FINDING(S): none  SPECIMEN(S):  None  INDICATIONS:   Patient is a 72 y.o.female who presents with non-functional dialysis catheter and ESRD.  The patient needs long term dialysis access for their ESRD, and a Permcath is necessary.  Risks and benefits are discussed and informed consent is obtained.    DESCRIPTION: After obtaining full informed written consent, the patient was brought back to the vascular suite. The patient received moderate conscious sedation during a face-to-face encounter with me present throughout the entire procedure and supervising the RN monitoring the vital signs, pulse oximetry, telemetry, and mental status throughout the entire procedure. The patient's existing catheter, right neck and chest were sterilely prepped and draped in a sterile surgical field was created.  The existing catheter was dissected free from the fibrous sheath securing the cuff with hemostats and blunt dissection.  A wire was placed. The existing catheter was then removed and the wire used to keep venous access. I selected a 19 cm tip to cuff tunneled dialysis catheter.  Using fluoroscopic guidance the catheter tips were parked in the right atrium. The appropriate distal connectors were placed. It withdrew blood well and flushed easily with heparinized saline and a concentrated heparin solution was then placed. It was secured to the chest wall with 2 Prolene sutures. A 4-0 Monocryl pursestring suture was placed around the exit  site. Sterile dressings were placed. The patient tolerated the procedure well and was taken to the recovery room in stable condition.  COMPLICATIONS: None  CONDITION: Stable  Festus Barren 03/23/2023 4:20 PM   This note was created with Dragon Medical transcription system. Any errors in dictation are purely unintentional.

## 2023-03-23 NOTE — Discharge Instructions (Signed)
Tunneled Catheter Insertion, Care After The following information offers guidance on how to care for yourself after your procedure. Your health care provider may also give you more specific instructions. If you have problems or questions, contact your health care provider. What can I expect after the procedure? After the procedure, it is common to have: Some mild redness, bruising, swelling, and pain around your catheter site. A small amount of blood or clear fluid coming from your incisions. Follow these instructions at home: Medicines Take over-the-counter and prescription medicines only as told by your health care provider. If you were prescribed an antibiotic medicine, take it as told by your health care provider. Do not stop taking the antibiotic even if you start to feel better. Incision care  Follow instructions from your health care provider about how to take care of your incisions. Make sure you: The dialysis center will change the dressing that covers your perm cath for dialysis. The skin glue on the site will gradually wear off, do not attempt to peel them off.  You need to avoid getting the area of your perm cath catheter clean and dry, so avoid showers, swimming, etc.  Check your incision areas every day for signs of infection.    Check for: More redness, swelling, or pain. More fluid or blood. Warmth. Pus or a bad smell. Catheter care  Keep your catheter site clean and dry. Leave thecaps on the ends of the catheter when not in use. Do not pull on your catheter. Activity Return to your normal activities as told by your health care provider. Ask your health care provider what activities are safe for you. Follow any other activity restrictions as instructed by your health care provider. Do not lift anything that is heavier than 10 lb (4.5 kg), or the limit that you are told, until your health care provider says that it is safe. Driving Do not drive for [id number] Ask your  health care provider if the medicine prescribed to you requires you to avoid driving or using machinery. General instructions Follow your health care provider's specific instructions for the type of catheter that you have. Do not take baths, swim, or use a hot tub until your health care provider approves. Ask your health care provider if you may take showers. Keep all follow-up visits. This is important. Contact a health care provider if: You feel unusually weak or nauseous. You have a fever or chills. You have more redness, swelling, or pain at your incisions or around the area where your catheter has been inserted or where it exits. You have pus or a bad smell coming from your catheter site. Your catheter site feels warm to the touch. Your catheter is not working properly. Fluid is leaking from the catheter, under the dressing, or around the dressing. You are unable to flush your catheter. Get help right away if: Your catheter develops a hole or it breaks. Your catheter comes loose or gets pulled completely out. If this happens, press on your catheter site firmly with a clean cloth until you can get medical help. You develop bleeding from your catheter or your insertion site, and your bleeding does not stop. You have swelling in your shoulder, neck, chest, or face. You have pain or swelling when fluids or medicines are being given through the catheter. You have chest pain or difficulty breathing. These symptoms may represent a serious problem that is an emergency. Do not wait to see if the symptoms will go away. Get  medical help right away. Call your local emergency services (911 in the [place] Do not drive yourself to the hospital. Summary After the procedure, it is common to have mild redness, swelling, and pain around your catheter site. Return to your normal activities as told by your health care provider. Ask your health care provider what activities are safe for you. Follow your  health care provider's specific instructions for the type of catheter that you have. Keep your catheter site and your dressings clean and dry. Contact a health care provider if your catheter is not working properly. Get help right away if you have chest pain, difficulty breathing, or your catheter comes loose or gets pulled completely out. This information is not intended to replace advice given to you by your health care provider. Make sure you discuss any questions you have with your health care provider. Document Revised: 08/06/2020 Document Reviewed: 08/06/2020 Elsevier Patient Education  2023 [employer].

## 2023-03-23 NOTE — Interval H&P Note (Signed)
History and Physical Interval Note:  03/23/2023 2:59 PM  Alexandra Foster  has presented today for surgery, with the diagnosis of Perma Cath Exchange    End Stage Renal.  The various methods of treatment have been discussed with the patient and family. After consideration of risks, benefits and other options for treatment, the patient has consented to  Procedure(s): DIALYSIS/PERMA CATHETER INSERTION (N/A) as a surgical intervention.  The patient's history has been reviewed, patient examined, no change in status, stable for surgery.  I have reviewed the patient's chart and labs.  Questions were answered to the patient's satisfaction.     Festus Barren

## 2023-03-23 NOTE — Patient Instructions (Signed)
Visit Information  Thank you for taking time to visit with me today. Please don't hesitate to contact me if I can be of assistance to you.   Following are the goals we discussed today:  Patient daughter will await notification from [facility] on PCS services. SW will submit PCS request form to provider.   Our next appointment is by telephone on 04/06/23 at 11am  Please call the care guide team at [phone] if you need to cancel or reschedule your appointment.   If you are experiencing a Mental Health or Behavioral Health Crisis or need someone to talk to, please call 911  Patient verbalizes understanding of instructions and care plan provided today and agrees to view in MyChart. Active MyChart status and patient understanding of how to access instructions and care plan via MyChart confirmed with patient.     Telephone follow up appointment with care management team member scheduled for: 04/06/23 at 11am.   Lysle Morales, BSW Social Worker [phone]

## 2023-03-23 NOTE — Patient Outreach (Signed)
  Care Coordination   Initial Visit Note   03/23/2023 Name: Alexandra Foster MRN: [id number] DOB: 26-Jan-1951  Dario Guardian Kuipers is a 72 y.o. year old female who sees [name], MD for primary care. I spoke with  Dario Guardian Shrestha daughter Earley Abide by phone today.  What matters to the patients health and wellness today?  Patient needs personal care. Patient received Medicaid and is on the waiting list for CAP.    Goals Addressed             This Visit's Progress    Care Coordination Activities       Interventions Today    Flowsheet Row Most Recent Value  Chronic Disease   Chronic disease during today's visit Diabetes, Chronic Obstructive Pulmonary Disease (COPD), Atrial Fibrillation (AFib), Other, Chronic Kidney Disease/End Stage Renal Disease (ESRD)  [Amputee]  General Interventions   General Interventions Discussed/Reviewed General Interventions Discussed, General Interventions Reviewed, Communication with  [Pt daughter reports need for personal care. Pt is bedbound and daughter is caregiver since September. OT/PT/Nurse are the only services received.]  Communication with --  [SW t/c [facility] and pt is on waiting list for CAP. Advised to have doctor submit [id number] for personal care while patient is waiting for CAP.  SW submit form for personal care to doctor.]              SDOH assessments and interventions completed:  Yes  SDOH Interventions Today    Flowsheet Row Most Recent Value  SDOH Interventions   Food Insecurity Interventions Intervention Not Indicated  Housing Interventions Intervention Not Indicated  Transportation Interventions Intervention Not Indicated, Other (Comment)  [Transportation through Medicaid]  Utilities Interventions Intervention Not Indicated        Care Coordination Interventions:  Yes, provided   Follow up plan: Follow up call scheduled for 04/06/23 at 11am    Encounter Outcome:  Patient Visit Completed

## 2023-03-24 ENCOUNTER — Encounter: Payer: Self-pay | Admitting: Vascular Surgery

## 2023-03-24 MED ORDER — LIDOCAINE-EPINEPHRINE (PF) 1 %-1:200000 IJ SOLN
INTRAMUSCULAR | Status: AC | PRN
  Administered 2023-03-23: 20 mL

## 2023-03-25 ENCOUNTER — Other Ambulatory Visit: Payer: Self-pay

## 2023-03-25 DIAGNOSIS — Z931 Gastrostomy status: Secondary | ICD-10-CM

## 2023-03-25 NOTE — Patient Outreach (Signed)
  Care Management  Transitions of Care Program Transitions of Care Post-discharge week 3  03/25/2023 Name: Alexandra Foster MRN: [id number] DOB: 1950-07-02  Subjective: Alexandra Foster is a 72 y.o. year old female who is a primary care patient of [name], MD. The Care Management team was unable to reach the patient by phone to assess and address transitions of care needs. Daughter had to go out of Town left this am am Rescheduled call for 03/29/22 11am   Plan: Additional outreach attempts will be made to reach the patient enrolled in the [facility] Program (Post Inpatient/ED Visit).  Susa Loffler , BSN, RN Care Management Coordinator [place]   [facility] [email or web address] Direct Dial: [phone]

## 2023-03-30 ENCOUNTER — Telehealth: Payer: Self-pay | Admitting: Family Medicine

## 2023-03-30 ENCOUNTER — Other Ambulatory Visit: Payer: Self-pay

## 2023-03-30 ENCOUNTER — Other Ambulatory Visit: Payer: Self-pay | Admitting: Family Medicine

## 2023-03-30 DIAGNOSIS — E1169 Type 2 diabetes mellitus with other specified complication: Secondary | ICD-10-CM

## 2023-03-30 MED ORDER — GLUCOSE BLOOD VI STRP: ORAL_STRIP | 12 refills | Status: DC

## 2023-03-30 MED ORDER — LANCETS MISC. MISC: 1.0000 | Freq: Three times a day (TID) | 12 refills | Status: AC

## 2023-03-30 NOTE — Telephone Encounter (Signed)
Medication Refill -  Most Recent Primary Care Visit:  Provider: Jacquelin Hawking E  Department: [facility]  Visit Type: HOSPITAL FU  Date: 03/20/2023  Medication: midodrine (PROAMATINE) 10 MG tablet   Has the patient contacted their pharmacy? Yes  Is this the correct pharmacy for this prescription? Yes If no, delete pharmacy and type the correct one.  This is the patient's preferred pharmacy:  [facility] [address], [place] - [address] [place] [address] Phone: [phone] Fax: [phone]   Has the prescription been filled recently? Yes  Is the patient out of the medication? Yes  Has the patient been seen for an appointment in the last year OR does the patient have an upcoming appointment? Yes  Can we respond through MyChart? No  Agent: Please be advised that Rx refills may take up to 3 business days. We ask that you follow-up with your pharmacy.

## 2023-03-30 NOTE — Patient Outreach (Signed)
Care Management  Transitions of Care Program Transitions of Care Post-discharge week 3   03/30/2023 Name: Alexandra Foster MRN: [id number] DOB: 02/24/1951  Subjective: Alexandra Foster is a 72 y.o. year old female who is a primary care patient of Alexandra Cory, MD. The Care Management team Engaged with patient Engaged with patient by telephone to assess and address transitions of care needs.   Consent to Services:  Patient was given information about care management services, agreed to services, and gave verbal consent to participate.   Assessment:  Patient voices no new complaints Patient has not developed/ reported any new Medical issues / Dx or acute changes.- since last follow-up call for most recent  Hospital stay    11/15-11/24 ER visit 12/9 / 2024  Per daughter things are status quo. They have CAPs assessment today @ 12:30/ Still unable to obtain glucometer strips Call placed to PCP office and also requested  [facility] Nurse write specific order for PCP approval.  She has T/F and Incontinence supplies, and medications available  Patient educated on red flag s/s to watch for and was encouraged to report, any changes in baseline or  medication regimen,  changes in health status  /  well-being, safety concerns  or any new unmanaged side effects or symptoms not relieved with interventions  to PCP and / or the  [facility] Case Management team          SDOH Interventions    Flowsheet Row Care Coordination from 03/23/2023 in [facility] Care Coordination Telephone from 03/10/2023 in [place] [facility]PARTMENT Telephone from 02/23/2023 in [place] POPULATION HEALTH DEPARTMENT Office Visit from 02/11/2023 in [facility] ED to Hosp-Admission (Discharged) from 12/05/2022 in [facility])  SDOH Interventions       Food Insecurity Interventions Intervention Not Indicated Intervention Not  Indicated Intervention Not Indicated -- Patient Unable to Answer  Housing Interventions Intervention Not Indicated Intervention Not Indicated Intervention Not Indicated -- --  Transportation Interventions Intervention Not Indicated, Other (Comment)  [Transportation through Medicaid] Patient Resources (Friends/Family), AMB Referral  [Uses DSS, Needs home assist, Transport assist,] Intervention Not Indicated, Patient Resources (Friends/Family), Payor Benefit -- --  Utilities Interventions Intervention Not Indicated Intervention Not Indicated Intervention Not Indicated -- --  Depression Interventions/Treatment  -- -- -- Currently on Treatment --        Goals Addressed             This Visit's Progress    TOC Care Plan       Current Barriers:  Care Coordination needs related to Transportation, Medication procurement, and ADL IADL limitations Chronic Disease Management support and education needs related to COPD and ESRD  Transportation barriers Difficulty obtaining medications  RNCM Clinical Goal(s):  Patient will work with the Care Management team over the next 30 days to address Transition of Care Barriers: Medication Management Diet/Nutrition/Food Resources Support at home Provider appointments Home Health services Transportation take all medications exactly as prescribed and will call provider for medication related questions as evidenced by medications are taken as prescribed, No missed medications, medications are available  attend all scheduled medical appointments: with PCP, Cardiology, Pulmonology and Nephrology  as evidenced by no missed appointments  work with social worker to address  related to the management of Transportation, Inability to perform ADL's independently, and Inability to perform IADL's independently related to the management of ESRD as evidenced by review of EMR and patient or social  worker report demonstrate a decrease in COPD and ESRD exacerbations as  evidenced by decreased ED / Urgent Care Visits   through collaboration with RN Care manager, provider, and care team.   Interventions: Evaluation of current treatment plan related to  self management and patient's adherence to plan as established by provider  Transitions of Care:  Goal on track:  Yes. and Provider cancelled virtual appt.  Amb referral appt. completed   Doctor Visits  - discussed the importance of doctor visits Contacted provider for patient needs regarding suspected yeast rash/ skin allergy. Need for Glucose strips, and PCP appt. Contacted Health RN/OT/PT - [facility] [phone] for scheduling initial appt, and eval skin rash   Patient Goals/Self-Care Activities: Participate in Transition of Care Program/Attend TOC scheduled calls Take all medications as prescribed Attend all scheduled provider appointments Call pharmacy for medication refills 3-7 days in advance of running out of medications Call provider office for new concerns or questions  Work with the social worker to address care coordination needs and will continue to work with the clinical team to address health care and disease management related needs  Follow Up Plan:  Telephone follow up appointment with care management team member scheduled for:  12/23 @ 12 N Has CAP referral/ assessment today   The patient has been provided with contact information for the care management team and has been advised to call with any health related questions or concerns.          Plan:   .care  Call placed to PCP office  spoke with Alexandra Foster  Requested Glucometer strips be called ro Pharmacy wit specific time of BID vs PRN, so equipment can be sent to patient .  Also requested [facility] write specific order and send to PCP for signature to facilitate ordering   This is the patient's preferred pharmacy:  [facility] [address], [place] - [address] [place] [address] Phone:  [phone] Fax: [phone] Routine follow-up and on-going assessment evaluation and education of disease processes, recommended interventions for both chronic and acute medical conditions , will occur during each weekly call along with ongoing review of symptoms ,medication reviews and reconciliation. If indicated ,any updates , inconsistencies, discrepancies or acute care concerns will be addressed and routed to the correct Practitioner .  Based on current information and Insurance plan -Reviewed benefits accessible to patient, including details about eligibility options for care and  available value based care options  if any areas of needs were identified.  Reviewed patient/  caregiver's ability to access and / or  ability with navigating the benefits system..Amb Referral made if indicted , refer to orders section of note for details   Please refer to Care Plan for goals and interventions -Effectiveness of interventions, symptom management and outcomes will be evaluated  weekly during [facility] 30-day Program Outreach calls  . Any necessary  changes and updates to Care Plan will be completed episodically    Reviewed goals for care Patient/ Caregiver  verbalizes understanding of instructions and care plan provided. Patient / Caregiver was encouraged to make informed decisions about their care, actively participate in managing their health condition, and implement lifestyle changes as needed to promote independence and self-management of health care   The patient has been provided with contact information for the care management team and has been advised to call with any health related questions or concerns.   Susa Loffler , BSN, RN Care Management Coordinator [place]  [facility] [email or web address] Direct Dial: [phone]

## 2023-03-30 NOTE — Telephone Encounter (Signed)
Waiting on Dr.Sowles to review and sign

## 2023-03-30 NOTE — Telephone Encounter (Signed)
Requested medication (s) are due for refill today: Yes  Requested medication (s) are on the active medication list: Yes  Last refill:  02/12/23  Future visit scheduled: Yes  Notes to clinic:  Unable to refill per protocol, cannot delegate.      Requested Prescriptions  Pending Prescriptions Disp Refills   midodrine (PROAMATINE) 10 MG tablet [Pharmacy Med Name: Midodrine HCl 10 MG Oral Tablet] 180 tablet 0    Sig: PLACE 2 TABLETS INTO FEEDING TUBE THREE TIMES DAILY WITH MEALS     Not Delegated - Cardiovascular: Midodrine Failed - 03/30/2023 10:57 AM      Failed - This refill cannot be delegated      Failed - Cr in normal range and within 360 days    Creat  Date Value Ref Range Status  02/11/2021 0.98 0.60 - 1.00 mg/dL Final   Creatinine, Ser  Date Value Ref Range Status  03/08/2023 2.25 (H) 0.44 - 1.00 mg/dL Final   Creatinine, Urine  Date Value Ref Range Status  02/11/2021 151 20 - 275 mg/dL Final         Failed - ALT in normal range and within 360 days    ALT  Date Value Ref Range Status  03/01/2023 92 (H) 0 - 44 U/L Final   SGPT (ALT)  Date Value Ref Range Status  08/11/2013 26 12 - 78 U/L Final         Failed - AST in normal range and within 360 days    AST  Date Value Ref Range Status  03/01/2023 91 (H) 15 - 41 U/L Final   SGOT(AST)  Date Value Ref Range Status  08/11/2013 27 15 - 37 Unit/L Final         Passed - Last BP in normal range    BP Readings from Last 1 Encounters:  03/23/23 117/63         Passed - Valid encounter within last 12 months    Recent Outpatient Visits           1 week ago Acute respiratory distress   [place] [facility] Mecum, Erin E, PA-C   1 month ago SOB (shortness of breath)   [facility] Mecum, Erin E, PA-C   1 month ago End-stage renal disease on hemodialysis [facility])   [facility] [name], MD   1 year ago Viral upper respiratory tract  infection   [place] [facility] [name], MD   2 years ago Diabetes mellitus type 2 in obese [facility])   [facility] [name], MD       Future Appointments             In 1 month Debbe Odea, MD [facility] [place]   In 2 months [name], MD [facility]

## 2023-03-30 NOTE — Telephone Encounter (Signed)
Previous rx for strips and lancets were not sent in electronically.

## 2023-03-30 NOTE — Telephone Encounter (Signed)
Medication requested today by pharmacy and routed to the office in that encounter.

## 2023-03-30 NOTE — Telephone Encounter (Signed)
Pt is missing her testing lancets and strips. The instructions need to say two times a day instead of as needed. Please advise, patient is completely out and has requested this since last month.

## 2023-04-01 ENCOUNTER — Other Ambulatory Visit: Payer: Self-pay | Admitting: Family Medicine

## 2023-04-01 MED ORDER — CHOLESTYRAMINE 4 G PO PACK: 4.0000 g | PACK | Freq: Three times a day (TID) | ORAL | 2 refills | Status: DC

## 2023-04-01 NOTE — Telephone Encounter (Signed)
Requested Prescriptions  Pending Prescriptions Disp Refills   cholestyramine (QUESTRAN) 4 g packet 90 each 2    Sig: Place 1 packet (4 g total) into feeding tube 3 (three) times daily.     Cardiovascular:  Antilipid - Bile Acid Sequestrants Failed - 04/01/2023  5:05 PM      Failed - Lipid Panel in normal range within the last 12 months    Cholesterol  Date Value Ref Range Status  02/11/2023 64 <200 mg/dL Final   LDL Cholesterol (Calc)  Date Value Ref Range Status  02/11/2023 19 mg/dL (calc) Final    Comment:    Reference range: <100 . Desirable range <100 mg/dL for primary prevention;   <70 mg/dL for patients with CHD or diabetic patients  with > or = 2 CHD risk factors. [name] LDL-C is now calculated using the Martin-Hopkins  calculation, which is a validated novel method providing  better accuracy than the Friedewald equation in the  estimation of LDL-C.  Horald Pollen et al. Lenox Ahr. 9629;528(41): 2061-2068  (http://education.[employer].com/faq/FAQ164)    HDL  Date Value Ref Range Status  02/11/2023 27 (L) > OR = 50 mg/dL Final   Triglycerides  Date Value Ref Range Status  02/11/2023 98 <150 mg/dL Final         Passed - Valid encounter within last 12 months    Recent Outpatient Visits           1 week ago Acute respiratory distress   [facility] Mecum, Erin E, PA-C   1 month ago SOB (shortness of breath)   [place] [facility] Mecum, Erin E, PA-C   1 month ago End-stage renal disease on hemodialysis [facility])   [place] [facility] [name], MD   1 year ago Viral upper respiratory tract infection   [facility] [facility] [name], MD   2 years ago Diabetes mellitus type 2 in obese [facility])   [facility] [name], MD       Future Appointments             In 4 weeks Agbor-Etang, Arlys John, MD [facility] [place]   In 2 months  [name], MD [facility], [facility]

## 2023-04-01 NOTE — Telephone Encounter (Signed)
Medication Refill -  Most Recent Primary Care Visit:  Provider: Jacquelin Hawking E  Department: [facility]  Visit Type: HOSPITAL FU  Date: 03/20/2023  Medication: cholestyramine (QUESTRAN) 4 g packet   Has the patient contacted their pharmacy? Yes  Is this the correct pharmacy for this prescription? Yes This is the patient's preferred pharmacy:  [facility] [address] [place] - [address] [place] [address] Phone: [phone] Fax: [phone]   Has the prescription been filled recently? Yes  Is the patient out of the medication? No  Has the patient been seen for an appointment in the last year OR does the patient have an upcoming appointment? Yes  Can we respond through MyChart? Yes  Agent: Please be advised that Rx refills may take up to 3 business days. We ask that you follow-up with your pharmacy.

## 2023-04-03 ENCOUNTER — Ambulatory Visit: Payer: Self-pay

## 2023-04-03 NOTE — Telephone Encounter (Signed)
     Chief Complaint: Danny with [facility] reports pt. Is coughing again, crackles in bases of lungs. O2 sat 80%, SOB. Family refuses ED. Asking if Dr. Carlynn Purl would try another antibiotic. State if pt. Feels worse they will call 911. Symptoms: Above Frequency: Today Pertinent Negatives: Patient denies  Disposition: [] ED /[] Urgent Care (no appt availability in office) / [] Appointment(In office/virtual)/ []  [place] [facility]/ [] Home Care/ [x] Refused Recommended Disposition /[] [place] [facility]/ [x]  Follow-up with PCP Additional Notes: Please advise family and Danny, nurse with [facility]. Contact number [phone]. Reason for Disposition  [1] MODERATE difficulty breathing (e.g., speaks in phrases, SOB even at rest, pulse 100-120) AND [2] still present when not coughing  Answer Assessment - Initial Assessment Questions 1. ONSET: "When did the cough begin?"      Today 2. SEVERITY: "How bad is the cough today?"      Moderate 3. SPUTUM: "Describe the color of your sputum" (none, dry cough; clear, white, yellow, green)     None 4. HEMOPTYSIS: "Are you coughing up any blood?" If so ask: "How much?" (flecks, streaks, tablespoons, etc.)     No 5. DIFFICULTY BREATHING: "Are you having difficulty breathing?" If Yes, ask: "How bad is it?" (e.g., mild, moderate, severe)    - MILD: No SOB at rest, mild SOB with walking, speaks normally in sentences, can lie down, no retractions, pulse < 100.    - MODERATE: SOB at rest, SOB with minimal exertion and prefers to sit, cannot lie down flat, speaks in phrases, mild retractions, audible wheezing, pulse 100-120.    - SEVERE: Very SOB at rest, speaks in single words, struggling to breathe, sitting hunched forward, retractions, pulse > 120      Moderate 6. FEVER: "Do you have a fever?" If Yes, ask: "What is your temperature, how was it measured, and when did it start?"     No 7. CARDIAC HISTORY: "Do you have any history of heart disease?"  (e.g., heart attack, congestive heart failure)      No 8. LUNG HISTORY: "Do you have any history of lung disease?"  (e.g., pulmonary embolus, asthma, emphysema)     Aspiration 9. PE RISK FACTORS: "Do you have a history of blood clots?" (or: recent major surgery, recent prolonged travel, bedridden)     No 10. OTHER SYMPTOMS: "Do you have any other symptoms?" (e.g., runny nose, wheezing, chest pain)       Crackles 11. PREGNANCY: "Is there any chance you are pregnant?" "When was your last menstrual period?"       No 12. TRAVEL: "Have you traveled out of the country in the last month?" (e.g., travel history, exposures)       No  Protocols used: Cough - Acute Non-Productive-A-AH

## 2023-04-03 NOTE — Telephone Encounter (Signed)
Please schedule

## 2023-04-03 NOTE — Telephone Encounter (Signed)
Daughter notified, mother will need to be seen at ED or urgent care

## 2023-04-06 ENCOUNTER — Other Ambulatory Visit: Payer: Self-pay

## 2023-04-06 ENCOUNTER — Ambulatory Visit: Payer: Self-pay

## 2023-04-06 NOTE — Patient Outreach (Signed)
Care Management  Transitions of Care Program Transitions of Care Post-discharge week 4   04/06/2023 Name: Alexandra Foster MRN: [id number] DOB: [birth year]  Subjective: Alexandra Foster is a 72 y.o. year old female who is a primary care patient of [name], MD. The Care Management team Engaged with patient Engaged with patient by telephone to assess and address transitions of care needs.   Consent to Services:  Patient was given information about care management services, agreed to services, and gave verbal consent to participate.   Assessment:   Patient voices no new complaints Patient has not developed/ reported any new Medical issues / Dx or acute changes.- since last follow-up call for most recent  Hospital stay    11/15-/11/24/ 2024  ER visit 12/9 Recc ER visit 12/20- family declined  She is currently @ Dialysis, spoke with spouse Vernice Jefferson to speak with Dtr. Ardean Larsen at this time. Per spouse patient is better since Friday and has decreased cough. Family is monitoring O2 sats and pee spouse they are " ok"  HH continues to see patient. Dialysis scheduled altered this week due to Holidays  Still needs Glucometer strips this has been an ongoing issue with getting order corrected so the insurance will send supplies.   She was approved for CAP services awaiting on final number of hours and then plan is to start services Family is supportive She is bed bound Tube fed by Family, has had some skin breakdown which is slowly resolving  Patient educated on red flag s/s to watch for and was encouraged to report, any changes in baseline or  medication regimen,  changes in health status  /  well-being, safety concerns  or any new unmanaged side effects or symptoms not relieved with interventions  to PCP and / or the  VBCI Case Management team          SDOH Interventions    Flowsheet Row Care Coordination from 03/23/2023 in [facility] Care Coordination Telephone  from 03/10/2023 in [place] POPULATION HEALTH DEPARTMENT Telephone from 02/23/2023 in [place] POPULATION HEALTH DEPARTMENT Office Visit from 02/11/2023 in [facility] ED to Hosp-Admission (Discharged) from 12/05/2022 in [facility] (1A)  SDOH Interventions       Food Insecurity Interventions Intervention Not Indicated Intervention Not Indicated Intervention Not Indicated -- Patient Unable to Answer  Housing Interventions Intervention Not Indicated Intervention Not Indicated Intervention Not Indicated -- --  Transportation Interventions Intervention Not Indicated, Other (Comment)  [Transportation through Medicaid] Patient Resources (Friends/Family), AMB Referral  [Uses DSS, Needs home assist, Transport assist,] Intervention Not Indicated, Patient Resources (Friends/Family), Payor Benefit -- --  Utilities Interventions Intervention Not Indicated Intervention Not Indicated Intervention Not Indicated -- --  Depression Interventions/Treatment  -- -- -- Currently on Treatment --        Goals Addressed             This Visit's Progress    COMPLETED: TOC Care Plan       Current Barriers:  Chronic Disease Management support and education needs related to ESRD   RNCM Clinical Goal(s):  Patient will work with the Care Management team over the next 30 days to address Transition of Care Barriers: Medication Management Diet/Nutrition/Food Resources Support at home Provider appointments Home Health services Equipment/DME through collaboration with RN Care manager, provider, and care team.   Interventions: Evaluation of current treatment plan related to  self management and patient's adherence to plan  as established by provider  Transitions of Care:  Goal on track:  NO. Doctor Visits  - discussed the importance of doctor visits  Patient Goals/Self-Care Activities: Participate in Transition of Care Program/Attend [facility] scheduled  calls Take all medications as prescribed Attend all scheduled provider appointments Call pharmacy for medication refills 3-7 days in advance of running out of medications  Follow Up Plan:  Telephone follow up appointment with care management team member scheduled for:  03/04/23 @ 10:00am - missed call will reattempt  The patient has been provided with contact information for the care management team and has been advised to call with any health related questions or concerns.       COMPLETED: TOC Care Plan       Current Barriers:  Care Coordination needs related to Transportation, Medication procurement, and ADL IADL limitations Chronic Disease Management support and education needs related to COPD and ESRD  Transportation barriers Difficulty obtaining medications  RNCM Clinical Goal(s):  Patient will work with the Care Management team over the next 30 days to address Transition of Care Barriers: Medication Management Diet/Nutrition/Food Resources Support at home Provider appointments Home Health services Transportation take all medications exactly as prescribed and will call provider for medication related questions as evidenced by medications are taken as prescribed, No missed medications, medications are available  attend all scheduled medical appointments: with PCP, Cardiology, Pulmonology and Nephrology  as evidenced by no missed appointments  work with Child psychotherapist to address  related to the management of Transportation, Inability to perform ADL's independently, and Inability to perform IADL's independently related to the management of ESRD as evidenced by review of EMR and patient or social worker report demonstrate a decrease in COPD and ESRD exacerbations as evidenced by decreased ED / Urgent Care Visits   through collaboration with RN Care manager, provider, and care team.   Interventions: Evaluation of current treatment plan related to  self management and patient's adherence to  plan as established by provider  Transitions of Care:  Goal on track:  Yes. and Provider cancelled virtual appt.  Amb referral appt. Completed has appt today Dialysis and referral to Amb Care    Doctor Visits  - discussed the importance of doctor visits Contacted provider for patient needs regarding suspected yeast rash/ skin allergy. Need for Glucose strips, and PCP appt. Contacted Health RN/OT/PT - [facility] [phone] for med mgmt and wound care  eval skin rash   Patient Goals/Self-Care Activities: Participate in Transition of Care Program/Attend TOC scheduled calls Take all medications as prescribed Attend all scheduled provider appointments Call pharmacy for medication refills 3-7 days in advance of running out of medications Call provider office for new concerns or questions  Work with the social worker to address care coordination needs and will continue to work with the clinical team to address health care and disease management related needs  Follow Up Plan:  The patient has been provided with contact information for the care management team and has been advised to call with any health related questions or concerns.  Transferred to Pod Nurse for ongoing  follow-up           Plan:  The patient has completed the 30-day TOC Program. Condition is unstable at times with recent s/s URTI acute needs identified at this time. Chronic conditions and ongoing care is  managed thru collaboration with  PCP,  Specialists and additional Healthcare Providers and Longitudinal Nurse and VBCI SW  . Appointments scheduled  Nurse  04/15/22  SW 04/26/22 Patient /Caregiver verbalized understanding of ongoing plan of care.  SDOH needs have been screened and interventions provided if identified.  Previously reviewed current home medications -- provided education as needed. Wit daughter   Previously discussed rationale of use, how/when to take medications. Patient/ Caregiver  is aware of potential side  effects, and was encouraged to notify PCP for any changes in condition or signs / symptoms not relieved  with interventions.   Patient will call 911 for Medical Emergencies or Life -Threatening or report to a local emergency department or urgent care.   Patient / Caregiver was encouraged to Contact PCP  with any questions or concerns regarding ongoing  medical care, any  difficulty obtaining or picking up  prescriptions, any  changes or  worsening in  condition including signs / symptoms not relieved  with interventions  Patient had no additional questions or concerns at this time. Receiving Dialysis today Current needs addressed.      The patient has been provided with contact information for the care management team and has been advised to call with any health related questions or concerns.   Susa Loffler , BSN, RN Care Management Coordinator [facility]   [facility] [email or web address] Direct Dial: [phone]

## 2023-04-06 NOTE — Patient Instructions (Signed)
Visit Information  Thank you for taking time to visit with me today. Please don't hesitate to contact me if I can be of assistance to you before our next scheduled telephone appointment.  Your next appointment is by telephone on 1/2 at 2:30pm with [facility]. RN  Following is a copy of your care plan:   Goals Addressed             This Visit's Progress    COMPLETED: TOC Care Plan       Current Barriers:  Chronic Disease Management support and education needs related to ESRD   RNCM Clinical Goal(s):  Patient will work with the Care Management team over the next 30 days to address Transition of Care Barriers: Medication Management Diet/Nutrition/Food Resources Support at home Provider appointments Home Health services Equipment/DME through collaboration with RN Care manager, provider, and care team.   Interventions: Evaluation of current treatment plan related to  self management and patient's adherence to plan as established by provider  Transitions of Care:  Goal on track:  NO. Doctor Visits  - discussed the importance of doctor visits  Patient Goals/Self-Care Activities: Participate in Transition of Care Program/Attend [facility] scheduled calls Take all medications as prescribed Attend all scheduled provider appointments Call pharmacy for medication refills 3-7 days in advance of running out of medications  Follow Up Plan:  Telephone follow up appointment with care management team member scheduled for:  03/04/23 @ 10:00am - missed call will reattempt  The patient has been provided with contact information for the care management team and has been advised to call with any health related questions or concerns.       COMPLETED: TOC Care Plan       Current Barriers:  Care Coordination needs related to Transportation, Medication procurement, and ADL IADL limitations Chronic Disease Management support and education needs related to COPD and ESRD  Transportation barriers Difficulty  obtaining medications  RNCM Clinical Goal(s):  Patient will work with the Care Management team over the next 30 days to address Transition of Care Barriers: Medication Management Diet/Nutrition/Food Resources Support at home Provider appointments Home Health services Transportation take all medications exactly as prescribed and will call provider for medication related questions as evidenced by medications are taken as prescribed, No missed medications, medications are available  attend all scheduled medical appointments: with PCP, Cardiology, Pulmonology and Nephrology  as evidenced by no missed appointments  work with Child psychotherapist to address  related to the management of Transportation, Inability to perform ADL's independently, and Inability to perform IADL's independently related to the management of ESRD as evidenced by review of EMR and patient or social worker report demonstrate a decrease in COPD and ESRD exacerbations as evidenced by decreased ED / Urgent Care Visits   through collaboration with RN Care manager, provider, and care team.   Interventions: Evaluation of current treatment plan related to  self management and patient's adherence to plan as established by provider  Transitions of Care:  Goal on track:  Yes. and Provider cancelled virtual appt.  Amb referral appt. Completed has appt today Dialysis and referral to Amb Care    Doctor Visits  - discussed the importance of doctor visits Contacted provider for patient needs regarding suspected yeast rash/ skin allergy. Need for Glucose strips, and PCP appt. Contacted Health RN/OT/PT - [facility] [phone] for med mgmt and wound care  eval skin rash   Patient Goals/Self-Care Activities: Participate in Transition of Care Program/Attend TOC scheduled calls  Take all medications as prescribed Attend all scheduled provider appointments Call pharmacy for medication refills 3-7 days in advance of running out of medications Call  provider office for new concerns or questions  Work with the social worker to address care coordination needs and will continue to work with the clinical team to address health care and disease management related needs  Follow Up Plan:  The patient has been provided with contact information for the care management team and has been advised to call with any health related questions or concerns.  Transferred to [facility] Nurse for ongoing  follow-up         The patient has  completed the 30-day [facility] Program. Floreen Comber has some acute needs at this time. Chronic conditions and ongoing care is  managed thru collaboration with  PCP,  Specialists and additional Healthcare Providers and Longitudinal Nurse and Child psychotherapist  . Patient / Caregiver verbalized understanding of ongoing plan of care.  SDOH needs have been screened and interventions provided if identified.  Previously Reviewed current home medications with Daughter -- still awaiting Glucometer strips to be ordered   Previously discussed rationale of use, how/when to take medications. Patient is aware of potential side effects, and was encouraged to notify PCP for any changes in condition or signs / symptoms not relieved  with interventions.   Patient will call 911 for Medical Emergencies or Life -Threatening or report to a local emergency department or urgent care.   Patient was encouraged to Contact PCP  with any questions or concerns regarding ongoing  medical care, any  difficulty obtaining or picking up  prescriptions, any  changes or  worsening in  condition including signs / symptoms not relieved  with interventions  Patient / Caregiver had no additional questions or concerns at this time. Current needs addressed.

## 2023-04-06 NOTE — Patient Instructions (Signed)
Visit Information  Thank you for taking time to visit with me today. Please don't hesitate to contact me if I can be of assistance to you.   Following are the goals we discussed today:  Patient to await start date for PCS.   Our next appointment is by telephone on 04/27/23 at 11am.  Please call the care guide team at [phone] if you need to cancel or reschedule your appointment.   If you are experiencing a Mental Health or Behavioral Health Crisis or need someone to talk to, please call 911  Patient verbalizes understanding of instructions and care plan provided today and agrees to view in MyChart. Active MyChart status and patient understanding of how to access instructions and care plan via MyChart confirmed with patient.     Telephone follow up appointment with care management team member scheduled for:04/27/23 at 11am.  Lysle Morales, BSW Social Worker [phone]

## 2023-04-06 NOTE — Patient Outreach (Signed)
  Care Coordination   Follow Up Visit Note   04/06/2023 Name: Alexandra Foster MRN: [id number] DOB: 1951/02/28  Alexandra Foster is a 72 y.o. year old female who sees [name], MD for primary care. I spoke with  Alexandra Foster by phone today.  What matters to the patients health and wellness today?  Patient has been approved for [facility] and is waiting for start date.    Goals Addressed             This Visit's Progress    Care Coordination Activities       Interventions Today    Flowsheet Row Most Recent Value  General Interventions   General Interventions Discussed/Reviewed General Interventions Discussed, General Interventions Reviewed  [Pt's daughter reports PCS were approved and assessment completed. Pt was approved for 80 hr per month. They are waiting for a start date.]              SDOH assessments and interventions completed:  No     Care Coordination Interventions:  Yes, provided   Follow up plan: Follow up call scheduled for 04/27/23 at 11am.    Encounter Outcome:  Patient Visit Completed

## 2023-04-12 ENCOUNTER — Other Ambulatory Visit: Payer: Self-pay | Admitting: Family Medicine

## 2023-04-12 DIAGNOSIS — Z931 Gastrostomy status: Secondary | ICD-10-CM

## 2023-04-16 ENCOUNTER — Other Ambulatory Visit: Payer: Self-pay | Admitting: Physician Assistant

## 2023-04-16 ENCOUNTER — Ambulatory Visit: Payer: Self-pay | Admitting: *Deleted

## 2023-04-16 NOTE — Patient Outreach (Signed)
  Care Coordination   Follow Up Visit Note   04/16/2023 Name: Alexandra Foster MRN: [id number] DOB: 1950/12/10  Alexandra Foster is a 73 y.o. year old female who sees Alexandra Mire, MD for primary care. I spoke with Alexandra Foster by phone today.  What matters to the patients health and wellness today?  Alexandra report patient is improving, expresses gratitude for all the assistance with her transition home from hospital. Denies any urgent concerns, encouraged to contact this care manager with questions.     Goals Addressed             This Visit's Progress    Effective management of chronic medical conditions       Interventions Today    Flowsheet Row Most Recent Value  Chronic Disease   Chronic disease during today's visit Diabetes, Chronic Obstructive Pulmonary Disease (COPD), Congestive Heart Failure (CHF), Chronic Kidney Disease/End Stage Renal Disease (ESRD), Atrial Fibrillation (AFib)  General Interventions   General Interventions Discussed/Reviewed General Interventions Reviewed, Doctor Visits, Level of Care  [HD sessions M/W/F]  Doctor Visits Discussed/Reviewed Doctor Visits Reviewed, PCP, Specialist  [upcoming with BSW 1/13 and Cardiology 1/15]  PCP/Specialist Visits Compliance with follow-up visit  Level of Care Personal Care Services  [Personal care services to start today]  Exercise Interventions   Exercise Discussed/Reviewed Physical Activity  Physical Activity Discussed/Reviewed Physical Activity Reviewed  [Working HH for PT/OT]  Education Interventions   Education Provided Provided Education  Provided Verbal Education On Blood Sugar Monitoring, Medication, When to see the doctor, Nutrition  [Blood sugars are monitored at HD, BP and oxyen saturations are monitored at home]  Nutrition Interventions   Nutrition Discussed/Reviewed Supplemental nutrition, Nutrition Reviewed  [Tolerating PEG bolus feedings]  Pharmacy Interventions   Pharmacy  Dicussed/Reviewed Affording Medications, Pharmacy Topics Reviewed              SDOH assessments and interventions completed:  No     Care Coordination Interventions:  Yes, provided   Follow up plan: Follow up call scheduled for 1/22    Encounter Outcome:  Patient Visit Completed   Odella Ku, RN, MSN, CCM   [facility], [facility] RN Care Coordinator Direct Dial: [phone] / Main [phone] Fax [phone] Email: [email or web address]: [email or web address]

## 2023-04-20 NOTE — Telephone Encounter (Signed)
 Requested medication (s) are due for refill today: yes  Requested medication (s) are on the active medication list: yes  Last refill:  03/20/23 #30/0  Future visit scheduled: yes  Notes to clinic:  Unable to refill per protocol, medication not assigned to the refill protocol.      Requested Prescriptions  Pending Prescriptions Disp Refills   multivitamin (RENA-VIT) TABS tablet [Pharmacy Med Name: Rena-Vite Oral Tablet] 30 tablet 0    Sig: PLACE ONE TABLET INTO FEEDING TUBE AT BEDTIME     Off-Protocol Failed - 04/20/2023 12:03 PM      Failed - Medication not assigned to a protocol, review manually.      Passed - Valid encounter within last 12 months    Recent Outpatient Visits           1 month ago Acute respiratory distress   [place] [facility] Mecum, Erin E, PA-C   1 month ago SOB (shortness of breath)   [place] [facility] Mecum, Rocky BRAVO, PA-C   2 months ago End-stage renal disease on hemodialysis [facility])   [place] [facility] Glenard Mire, MD   1 year ago Viral upper respiratory tract infection   [place] [facility] Glenard Mire, MD   2 years ago Diabetes mellitus type 2 in obese [facility])   [place] [facility] Glenard Mire, MD       Future Appointments             In 1 week Darliss Rogue, MD [facility] at [place]   In 2 months Sowles, Krichna, MD [facility], [facility]           Endocrinology:  Vitamins Passed - 04/20/2023 12:03 PM      Passed - Valid encounter within last 12 months    Recent Outpatient Visits           1 month ago Acute respiratory distress   [place] [facility] Mecum, Rocky BRAVO, PA-C   1 month ago SOB (shortness of breath)   [facility] Mecum, Rocky BRAVO, PA-C   2 months ago End-stage renal disease on hemodialysis [facility])   [place] [facility]  Sowles, Krichna, MD   1 year ago Viral upper respiratory tract infection    [facility] Sowles, Krichna, MD   2 years ago Diabetes mellitus type 2 in obese [facility])   [facility] Sowles, Krichna, MD       Future Appointments             In 1 week Agbor-Etang, Rogue, MD [facility] at [place]   In 2 months Sowles, Krichna, MD [facility], [facility]

## 2023-04-22 ENCOUNTER — Other Ambulatory Visit: Payer: Self-pay | Admitting: Family Medicine

## 2023-04-22 DIAGNOSIS — F419 Anxiety disorder, unspecified: Secondary | ICD-10-CM

## 2023-04-23 ENCOUNTER — Other Ambulatory Visit: Payer: Self-pay | Admitting: Family Medicine

## 2023-04-27 ENCOUNTER — Other Ambulatory Visit: Payer: Self-pay | Admitting: Family Medicine

## 2023-04-27 ENCOUNTER — Telehealth: Payer: Self-pay | Admitting: Family Medicine

## 2023-04-27 ENCOUNTER — Ambulatory Visit: Payer: Self-pay

## 2023-04-27 NOTE — Telephone Encounter (Signed)
 Home Health Verbal Orders - Caller/Agency: Creta Levin from [facility] Callback Number: [phone] Service Requested: Physical Therapy Frequency: increase 2x2w; extend to 2x9w Any new concerns about the patient? No

## 2023-04-27 NOTE — Patient Outreach (Signed)
  Care Coordination   Follow Up Visit Note   04/27/2023 Name: Alexandra Foster MRN: [id number] DOB: [birth year]  Alexandra Foster is a 73 y.o. year old female who sees Glenard Mire, MD for primary care. I spoke with  Alexandra Foster daughter Alexandra Foster by phone today.  What matters to the patients health and wellness today?  Patient had started [facility] services.    Goals Addressed             This Visit's Progress    Care Coordination Activities       Interventions Today    Flowsheet Row Most Recent Value  General Interventions   General Interventions Discussed/Reviewed General Interventions Discussed, General Interventions Reviewed  [Daughter reports pt PCS services started 1 wk ago. Pt is eligible for 80 hrs a month. PCS staff visits MTWF and every other weekend all night.  Daughter reports no other unmet need.]              SDOH assessments and interventions completed:  No     Care Coordination Interventions:  Yes, provided   Follow up plan: No further intervention required.   Encounter Outcome:  Patient Visit Completed

## 2023-04-27 NOTE — Telephone Encounter (Signed)
VO given to [place]

## 2023-04-27 NOTE — Patient Instructions (Signed)
 Visit Information  Thank you for taking time to visit with me today. Please don't hesitate to contact me if I can be of assistance to you.   Following are the goals we discussed today:  Patient has started [facility] and is pleased with the outcome.    If you are experiencing a Mental Health or Behavioral Health Crisis or need someone to talk to, please call 911  Patient verbalizes understanding of instructions and care plan provided today and agrees to view in MyChart. Active MyChart status and patient understanding of how to access instructions and care plan via MyChart confirmed with patient.     No further follow up required: Patient does not request a follow up visit.  Tillman Gardener, BSW Social Worker [phone]

## 2023-04-29 ENCOUNTER — Other Ambulatory Visit: Payer: Self-pay | Admitting: Physician Assistant

## 2023-04-29 ENCOUNTER — Encounter: Payer: Self-pay | Admitting: Cardiology

## 2023-04-29 ENCOUNTER — Ambulatory Visit: Payer: Medicare Other | Attending: Cardiology | Admitting: Cardiology

## 2023-04-29 ENCOUNTER — Ambulatory Visit: Payer: Medicare Other

## 2023-04-29 VITALS — BP 113/75 | HR 66 | Ht 64.0 in

## 2023-04-29 DIAGNOSIS — E7800 Pure hypercholesterolemia, unspecified: Secondary | ICD-10-CM | POA: Diagnosis present

## 2023-04-29 DIAGNOSIS — I480 Paroxysmal atrial fibrillation: Secondary | ICD-10-CM | POA: Insufficient documentation

## 2023-04-29 NOTE — Progress Notes (Addendum)
Cardiology Office Note:    Date:  04/29/2023   ID:  Alexandra Foster, Alexandra Foster 1950/12/16, MRN [id number]  PCP:  [name], MD   [facility] Providers Cardiologist:  Debbe Odea, MD     Referring MD: [name], MD   Chief Complaint  Patient presents with   New Patient (Initial Visit)    Referred for cardiac evaluation of Paroxysmal atrial fibrillation with no known personal cardiac history.      History of Present Illness:    Alexandra Foster is a 73 y.o. female with a hx of hyperlipidemia, diabetes former smoker x 40+ years, COPD, ESRD on HD TThS, left AKA presenting due to paroxysmal atrial fibrillation.  Patient has a history of paroxysmal atrial fibrillation noted back in January 2023 in the setting of septic shock/cellulitis.  Amiodarone was started to maintain sinus rhythm upon discharge.  Amiodarone stopped 4 months ago per PCP.  Referral placed to evaluate restarting Amio.  Per EMR record, patient has had several hospital admissions from bacteremia typically MRSA bacteremia from infected PermCath, UTI Enterococcus.  Admission 2 months ago 03/01/2023 was for hypotension during hemodialysis needing pressors, midodrine.  Eventually diagnosed with aspiration pneumonia, also infected decubitus ulcers.  On Florinef and midodrine for chronic hypotension.    Echocardiogram 02/15/2023 normal EF 60 to 65%.  Past Medical History:  Diagnosis Date   AKI (acute kidney injury) (HCC)    a. 04/2021 in setting of bacteremia/shock.   Arthritis    Asthma    Bacteremia    a. 04/2021 S pyogenes bacteremia in setting of lower ext cellulitis.   COPD (chronic obstructive pulmonary disease) (HCC)    Diabetes mellitus without complication (HCC)    Endometriosis    GERD (gastroesophageal reflux disease)    History of echocardiogram    a. 07/2013 Echo: EF 55-60%, impaired relaxation, mild TR; b. 04/2021 Echo: EF 50-55%, mild LVH, nl RV fxn, mild BAE, Ao sclerosis w/o  stenosis.   Hypertension    Obesity    PAF (paroxysmal atrial fibrillation) (HCC)    a. 04/2021 in setting of septic shock/cellulitis.   Sleep apnea    CPAP    Past Surgical History:  Procedure Laterality Date   ABDOMINAL HYSTERECTOMY     APPLICATION OF WOUND VAC Left 05/17/2021   Procedure: APPLICATION OF WOUND VAC/WOUND VAC EXCHANGE-Matrix Myriad;  Surgeon: Carolan Shiver, MD;  Location: [facility];  Service: General;  Laterality: Left;   APPLICATION OF WOUND VAC  05/24/2021   Procedure: APPLICATION OF WOUND VAC;  Surgeon: Carolan Shiver, MD;  Location: [facility];  Service: General;;   APPLICATION OF WOUND VAC  05/10/2021   Procedure: APPLICATION OF WOUND VAC;  Surgeon: Carolan Shiver, MD;  Location: [facility];  Service: General;;   COLONOSCOPY  10/29/2006   Dr Servando Snare   COLONOSCOPY WITH PROPOFOL N/A 11/05/2016   Procedure: COLONOSCOPY WITH PROPOFOL;  Surgeon: Earline Mayotte, MD;  Location: [facility];  Service: Endoscopy;  Laterality: N/A;   DIALYSIS/PERMA CATHETER INSERTION N/A 12/19/2022   Procedure: DIALYSIS/PERMA CATHETER INSERTION;  Surgeon: Annice Needy, MD;  Location: [facility];  Service: Cardiovascular;  Laterality: N/A;   DIALYSIS/PERMA CATHETER INSERTION N/A 02/04/2023   Procedure: DIALYSIS/PERMA CATHETER INSERTION;  Surgeon: Annice Needy, MD;  Location: [facility];  Service: Cardiovascular;  Laterality: N/A;   DIALYSIS/PERMA CATHETER INSERTION N/A 02/16/2023   Procedure: DIALYSIS/PERMA CATHETER INSERTION;  Surgeon: Annice Needy, MD;  Location: [facility];  Service: Cardiovascular;  Laterality: N/A;   DIALYSIS/PERMA CATHETER INSERTION N/A 03/23/2023   Procedure: DIALYSIS/PERMA CATHETER INSERTION;  Surgeon: Annice Needy, MD;  Location: [facility];  Service: Cardiovascular;  Laterality: N/A;   INCISION AND DRAINAGE OF WOUND Left 05/24/2021   Procedure: IRRIGATION AND DEBRIDEMENT LEFT LEG;  Surgeon: Carolan Shiver,  MD;  Location: [facility];  Service: General;  Laterality: Left;   INCISION AND DRAINAGE OF WOUND Left 05/10/2021   Procedure: IRRIGATION AND DEBRIDEMENT LEFT LEG;  Surgeon: Carolan Shiver, MD;  Location: [facility];  Service: General;  Laterality: Left;   IR FLUORO GUIDE CV LINE RIGHT  06/12/2021   IR PERC TUN PERIT CATH WO PORT S&I /IMAG  06/12/2021   IR RADIOLOGIST EVAL & MGMT  03/04/2023   IR REPLC GASTRO/COLONIC TUBE PERCUT W/FLUORO  06/12/2021   IVC FILTER INSERTION N/A 05/29/2021   Procedure: IVC FILTER INSERTION;  Surgeon: Renford Dills, MD;  Location: [facility];  Service: Cardiovascular;  Laterality: N/A;   MINOR GRAFT APPLICATION  05/24/2021   Procedure: Myriad Matrix  APPLICATION;  Surgeon: Carolan Shiver, MD;  Location: [facility];  Service: General;;   NASAL SINUS SURGERY  2002   Dr Chestine Spore   PEG PLACEMENT N/A 05/28/2021   Procedure: PERCUTANEOUS ENDOSCOPIC GASTROSTOMY (PEG) PLACEMENT;  Surgeon: Sung Amabile, DO;  Location: [facility];  Service: General;  Laterality: N/A;  TRAVEL CASE   TRACHEOSTOMY TUBE PLACEMENT N/A 05/17/2021   Procedure: TRACHEOSTOMY;  Surgeon: Geanie Logan, MD;  Location: [facility];  Service: ENT;  Laterality: N/A;   WOUND DEBRIDEMENT Left 05/07/2021   Procedure: DEBRIDEMENT WOUND;  Surgeon: Carolan Shiver, MD;  Location: [facility];  Service: General;  Laterality: Left;    Current Medications: Current Meds  Medication Sig   acetaminophen (TYLENOL) 325 MG tablet Place 2 tablets (650 mg total) into feeding tube every 6 (six) hours as needed for mild pain (or Fever >/= 101).   ascorbic acid (VITAMIN C) 500 MG tablet Place 0.5 tablets (250 mg total) into feeding tube daily.   atorvastatin (LIPITOR) 20 MG tablet Place 1 tablet (20 mg total) into feeding tube daily.   cholestyramine (QUESTRAN) 4 g packet Place 1 packet (4 g total) into feeding tube 3 (three) times daily.   clonazePAM (KLONOPIN) 0.5 MG tablet TAKE 1/2 TO 1 (ONE-HALF TO ONE)  TABLET BY MOUTH ONCE DAILY AS NEEDED FOR ANXIETY   escitalopram (LEXAPRO) 10 MG tablet Place 1 tablet (10 mg total) into feeding tube daily.   famotidine (PEPCID) 10 MG tablet Place 1 tablet (10 mg total) into feeding tube daily.   fludrocortisone (FLORINEF) 0.1 MG tablet Place 2 tablets (0.2 mg total) into feeding tube daily.   glucose blood test strip Use 1 strip to check blood glucose levels 2 times daily   hydrOXYzine (ATARAX) 50 MG tablet Take 1 tablet by mouth three times daily as needed   Lancets Misc. MISC 1 each by Does not apply route in the morning, at noon, and at bedtime. Use 1 strip to check blood glucose levels 2 times daily May substitute to any manufacturer covered by patient's insurance.   liver oil-zinc oxide (DESITIN) 40 % ointment Apply topically 3 (three) times daily as needed for irritation. Apply to skin around G tube 3 times daily as needed for skin irritation   metoCLOPramide (REGLAN) 5 MG tablet TAKE 1 TABLET BY MOUTH THREE TIMES DAILY BEFORE MEAL(S)   midodrine (PROAMATINE) 10 MG tablet PLACE 2 TABLETS INTO  FEEDING TUBE 3 TIMES DAILY WITH MEALS   multivitamin (RENA-VIT) TABS tablet PLACE ONE TABLET INTO FEEDING TUBE AT BEDTIME   Nutritional Supplements (FEEDING SUPPLEMENT, NEPRO CARB STEADY,) LIQD Place 1,120 mLs into feeding tube daily.   nystatin (MYCOSTATIN/NYSTOP) powder Apply topically 3 (three) times daily.   traZODone (DESYREL) 50 MG tablet Take 0.5 tablets (25 mg total) by mouth at bedtime as needed for sleep.   vitamin D3 (CHOLECALCIFEROL) 25 MCG tablet Place 2 tablets (2,000 Units total) into feeding tube daily.   Water For Irrigation, Sterile (FREE WATER) SOLN Place 50 mLs into feeding tube every 4 (four) hours.     Allergies:   Chlorhexidine and Augmentin [amoxicillin-pot clavulanate]   Social History   Socioeconomic History   Marital status: Married    Spouse name: [name]   Number of children: 1   Years of education: 12   Highest education level:  12th grade  Occupational History    Employer: RETIRED  Tobacco Use   Smoking status: Former    Current packs/day: 0.00    Average packs/day: 2.0 packs/day for 40.0 years (80.0 ttl pk-yrs)    Types: Cigarettes    Start date: 1963    Quit date: 2003    Years since quitting: 22.0   Smokeless tobacco: Former    Types: Snuff    Quit date: 04/2001   Tobacco comments:    smoking cessation materials not required  Vaping Use   Vaping status: Never Used  Substance and Sexual Activity   Alcohol use: No    Alcohol/week: 0.0 standard drinks of alcohol   Drug use: No   Sexual activity: Not Currently  Other Topics Concern   Not on file  Social History Narrative   Not on file   Social Drivers of Health   Financial Resource Strain: Medium Risk (08/09/2020)   Overall Financial Resource Strain (CARDIA)    Difficulty of Paying Living Expenses: Somewhat hard  Food Insecurity: No Food Insecurity (03/23/2023)   Hunger Vital Sign    Worried About Running Out of Food in the Last Year: Never true    Ran Out of Food in the Last Year: Never true  Transportation Needs: No Transportation Needs (03/23/2023)   PRAPARE - Administrator, Civil Service (Medical): No    Lack of Transportation (Non-Medical): No  Recent Concern: Transportation Needs - Unmet Transportation Needs (03/10/2023)   PRAPARE - Transportation    Lack of Transportation (Medical): Yes    Lack of Transportation (Non-Medical): Yes  Physical Activity: Inactive (08/09/2020)   Exercise Vital Sign    Days of Exercise per Week: 0 days    Minutes of Exercise per Session: 0 min  Stress: No Stress Concern Present (08/09/2020)   [employer] of Occupational Health - Occupational Stress Questionnaire    Feeling of Stress : Not at all  Social Connections: Moderately Integrated (08/09/2020)   Social Connection and Isolation Panel [NHANES]    Frequency of Communication with Friends and Family: More than three times a week     Frequency of Social Gatherings with Friends and Family: More than three times a week    Attends Religious Services: More than 4 times per year    Active Member of [employer] or Organizations: No    Attends Banker Meetings: Never    Marital Status: Married     Family History: The patient's family history includes Alcohol abuse in her brother; Breast cancer (age of onset: 57) in her sister;  Congestive Heart Failure in her mother; Coronary artery disease (age of onset: 19) in her father; Heart disease in her brother; Varicose Veins in her brother.  ROS:   Please see the history of present illness.     All other systems reviewed and are negative.  EKGs/Labs/Other Studies Reviewed:    The following studies were reviewed today:  EKG Interpretation Date/Time:  Wednesday April 29 2023 11:27:46 EST Ventricular Rate:  66 PR Interval:  172 QRS Duration:  122 QT Interval:  460 QTC Calculation: 482 R Axis:   12  Text Interpretation: Normal sinus rhythm with sinus arrhythmia Right bundle branch block Confirmed by Debbe Odea ([id number]) on 04/29/2023 11:36:25 AM    Recent Labs: 02/27/2023: B Natriuretic Peptide 88.2 03/01/2023: ALT 92; TSH 2.482 03/07/2023: Hemoglobin 9.5; Platelets 325 03/08/2023: BUN 33; Creatinine, Ser 2.25; Magnesium 2.1; Potassium 3.9; Sodium 134  Recent Lipid Panel    Component Value Date/Time   CHOL 64 02/11/2023 1455   TRIG 98 02/11/2023 1455   HDL 27 (L) 02/11/2023 1455   CHOLHDL 2.4 02/11/2023 1455   VLDL 33 12/07/2022 0213   LDLCALC 19 02/11/2023 1455     Risk Assessment/Calculations:             Physical Exam:    VS:  BP 113/75 (BP Location: Left Arm, Patient Position: Sitting, Cuff Size: Large)   Pulse 66   Ht 5\' 4"  (1.626 m)   SpO2 95%   BMI 32.61 kg/m     Wt Readings from Last 3 Encounters:  03/23/23 190 lb (86.2 kg)  03/08/23 181 lb 10.5 oz (82.4 kg)  02/27/23 189 lb (85.7 kg)     GEN:  Well nourished, appears  somnolent HEENT: Normal NECK: No JVD; No carotid bruits CARDIAC: RRR, no murmurs, rubs, gallops RESPIRATORY: Diminished breath sounds bilaterally, no wheezing ABDOMEN: Soft, non-tender, non-distended MUSCULOSKELETAL:  No edema; left AKA noted SKIN: Warm and dry NEUROLOGIC:  Alert and oriented to person, somnolent PSYCHIATRIC:  Normal affect   ASSESSMENT:    1. Paroxysmal atrial fibrillation (HCC)   2. Pure hypercholesterolemia    PLAN:    In order of problems listed above:  Paroxysmal atrial fibrillation, occurring in the setting of sepsis.  Off amiodarone the past 4 to 5 months.  Will avoid anticoagulation due to comorbidities, chronic anemia.  Place cardiac monitor to evaluate A-fib recurrence/burden if any.  Okay to hold amnio for now.  If A-fib noted on monitor, will recommend restarting amiodarone to maintain sinus rhythm as patient is not candidate for long-term anticoagulation, also likely not candidate for invasive procedures/ablation.  Last echo with normal EF. Hyperlipidemia, cholesterol controlled.  Continue Lipitor 20.  Follow-up after cardiac monitor.     Medication Adjustments/Labs and Tests Ordered: Current medicines are reviewed at length with the patient today.  Concerns regarding medicines are outlined above.  Orders Placed This Encounter  Procedures   LONG TERM MONITOR (3-14 DAYS)   EKG 12-Lead   No orders of the defined types were placed in this encounter.   Patient Instructions  Medication Instructions:   Your physician recommends that you continue on your current medications as directed. Please refer to the Current Medication list given to you today.   *If you need a refill on your cardiac medications before your next appointment, please call your pharmacy*   Lab Work:  None Ordered  If you have labs (blood work) drawn today and your tests are completely normal, you will receive your results only  by: MyChart Message (if you have MyChart) OR A  paper copy in the mail If you have any lab test that is abnormal or we need to change your treatment, we will call you to review the results.   Testing/Procedures:  Your physician has recommended that you wear a Zio monitor.   This monitor is a medical device that records the heart's electrical activity. Doctors most often use these monitors to diagnose arrhythmias. Arrhythmias are problems with the speed or rhythm of the heartbeat. The monitor is a small device applied to your chest. You can wear one while you do your normal daily activities. While wearing this monitor if you have any symptoms to push the button and record what you felt. Once you have worn this monitor for the period of time provider prescribed (Usually 14 days), you will return the monitor device in the postage paid box. Once it is returned they will download the data collected and provide [place] with a report which the provider will then review and we will call you with those results. Important tips:  Avoid showering during the first 24 hours of wearing the monitor. Avoid excessive sweating to help maximize wear time. Do not submerge the device, no hot tubs, and no swimming pools. Keep any lotions or oils away from the patch. After 24 hours you may shower with the patch on. Take brief showers with your back facing the shower head.  Do not remove patch once it has been placed because that will interrupt data and decrease adhesive wear time. Push the button when you have any symptoms and write down what you were feeling. Once you have completed wearing your monitor, remove and place into box which has postage paid and place in your outgoing mailbox.  If for some reason you have misplaced your box then call our office and we can provide another box and/or mail it off for you.      Follow-Up: At [facility], you and your health needs are our priority.  As part of our continuing mission to provide you with exceptional  heart care, we have created designated Provider Care Teams.  These Care Teams include your primary Cardiologist (physician) and Advanced Practice Providers (APPs -  Physician Assistants and Nurse Practitioners) who all work together to provide you with the care you need, when you need it.  We recommend signing up for the patient portal called "MyChart".  Sign up information is provided on this After Visit Summary.  MyChart is used to connect with patients for Virtual Visits (Telemedicine).  Patients are able to view lab/test results, encounter notes, upcoming appointments, etc.  Non-urgent messages can be sent to your provider as well.   To learn more about what you can do with MyChart, go to [email or web address].    Your next appointment:   3 month(s)  Provider:   You may see Debbe Odea, MD or one of the following Advanced Practice Providers on your designated Care Team:   Nicolasa Ducking, NP Eula Listen, PA-C Cadence Fransico Michael, PA-C Charlsie Quest, NP Carlos Levering, NP    Signed, Debbe Odea, MD  04/29/2023 12:51 PM    [facility]

## 2023-04-29 NOTE — Telephone Encounter (Signed)
 Requested medication (s) are due for refill today - yes  Requested medication (s) are on the active medication list -yes  Future visit scheduled -yes  Last refill: 03/20/23 #60  Notes to clinic: non delegated Rx  Requested Prescriptions  Pending Prescriptions Disp Refills   fludrocortisone  (FLORINEF ) 0.1 MG tablet [Pharmacy Med Name: Fludrocortisone  Acetate 0.1 MG Oral Tablet] 60 tablet 0    Sig: PLACE TWO TABLETS INTO FEEDING TUBE DAILY     Not Delegated - Endocrinology: Oral Corticosteroids - fludrocortisone  Failed - 04/29/2023  4:03 PM      Failed - This refill cannot be delegated      Failed - Manual Review: Eye exam for IOP if prolonged treatment      Failed - Na in normal range and within 180 days    Sodium  Date Value Ref Range Status  03/08/2023 134 (L) 135 - 145 mmol/L Final  08/11/2013 138 136 - 145 mmol/L Final         Failed - Bone Mineral Density or Dexa Scan completed in the last 2 years      Passed - K in normal range and within 180 days    Potassium  Date Value Ref Range Status  03/08/2023 3.9 3.5 - 5.1 mmol/L Final  08/11/2013 3.3 (L) 3.5 - 5.1 mmol/L Final   Potassium [facility] vascular lab)  Date Value Ref Range Status  03/23/2023 4.7 3.5 - 5.1 mmol/L Final    Comment:    Performed at [facility], [address] [place], [place] [address]         Passed - Glucose (serum) in normal range and within 180 days    Glucose  Date Value Ref Range Status  08/11/2013 107 (H) 65 - 99 mg/dL Final   Glucose, Bld  Date Value Ref Range Status  03/08/2023 91 70 - 99 mg/dL Final    Comment:    Glucose reference range applies only to samples taken after fasting for at least 8 hours.   POC Glucose  Date Value Ref Range Status  12/16/2017 83 70 - 99 mg/dl Final   Glucose Fasting, POC  Date Value Ref Range Status  12/16/2017 83 70 - 99 mg/dL Final   Glucose-Capillary  Date Value Ref Range Status  03/23/2023 91 70 - 99 mg/dL Final    Comment:     Glucose reference range applies only to samples taken after fasting for at least 8 hours.         Passed - Last BP in normal range    BP Readings from Last 1 Encounters:  04/29/23 113/75         Passed - Valid encounter within last 6 months    Recent Outpatient Visits           1 month ago Acute respiratory distress   [facility] Mecum, Erin E, PA-C   2 months ago SOB (shortness of breath)   [place] [facility] Mecum, Pearla Bottom, PA-C   2 months ago End-stage renal disease on hemodialysis [facility])   [facility] Sowles, Krichna, MD   1 year ago Viral upper respiratory tract infection    [facility] Sowles, Krichna, MD   2 years ago Diabetes mellitus type 2 in obese [facility])   [facility] Sowles, Krichna, MD       Future Appointments  In 1 month Sowles, Krichna, MD [facility]               Requested Prescriptions  Pending Prescriptions Disp Refills   fludrocortisone  (FLORINEF ) 0.1 MG tablet [Pharmacy Med Name: Fludrocortisone  Acetate 0.1 MG Oral Tablet] 60 tablet 0    Sig: PLACE TWO TABLETS INTO FEEDING TUBE DAILY     Not Delegated - Endocrinology: Oral Corticosteroids - fludrocortisone  Failed - 04/29/2023  4:03 PM      Failed - This refill cannot be delegated      Failed - Manual Review: Eye exam for IOP if prolonged treatment      Failed - Na in normal range and within 180 days    Sodium  Date Value Ref Range Status  03/08/2023 134 (L) 135 - 145 mmol/L Final  08/11/2013 138 136 - 145 mmol/L Final         Failed - Bone Mineral Density or Dexa Scan completed in the last 2 years      Passed - K in normal range and within 180 days    Potassium  Date Value Ref Range Status  03/08/2023 3.9 3.5 - 5.1 mmol/L Final  08/11/2013 3.3 (L) 3.5 - 5.1 mmol/L Final   Potassium [facility] vascular lab)  Date Value Ref  Range Status  03/23/2023 4.7 3.5 - 5.1 mmol/L Final    Comment:    Performed at [facility], [address] [place], [place] [address]         Passed - Glucose (serum) in normal range and within 180 days    Glucose  Date Value Ref Range Status  08/11/2013 107 (H) 65 - 99 mg/dL Final   Glucose, Bld  Date Value Ref Range Status  03/08/2023 91 70 - 99 mg/dL Final    Comment:    Glucose reference range applies only to samples taken after fasting for at least 8 hours.   POC Glucose  Date Value Ref Range Status  12/16/2017 83 70 - 99 mg/dl Final   Glucose Fasting, POC  Date Value Ref Range Status  12/16/2017 83 70 - 99 mg/dL Final   Glucose-Capillary  Date Value Ref Range Status  03/23/2023 91 70 - 99 mg/dL Final    Comment:    Glucose reference range applies only to samples taken after fasting for at least 8 hours.         Passed - Last BP in normal range    BP Readings from Last 1 Encounters:  04/29/23 113/75         Passed - Valid encounter within last 6 months    Recent Outpatient Visits           1 month ago Acute respiratory distress   [facility] Mecum, Erin E, PA-C   2 months ago SOB (shortness of breath)   [facility] Mecum, Pearla Bottom, PA-C   2 months ago End-stage renal disease on hemodialysis [facility])   [place] [facility] Sowles, Krichna, MD   1 year ago Viral upper respiratory tract infection   [place] [facility] Sowles, Krichna, MD   2 years ago Diabetes mellitus type 2 in obese [facility])   [facility] Sowles, Krichna, MD       Future Appointments             In 1 month Ava Lei, Krichna, MD [facility]

## 2023-04-29 NOTE — Patient Instructions (Signed)
 Medication Instructions:   Your physician recommends that you continue on your current medications as directed. Please refer to the Current Medication list given to you today.  *If you need a refill on your cardiac medications before your next appointment, please call your pharmacy*   Lab Work:  None Ordered  If you have labs (blood work) drawn today and your tests are completely normal, you will receive your results only by: MyChart Message (if you have MyChart) OR A paper copy in the mail If you have any lab test that is abnormal or we need to change your treatment, we will call you to review the results.   Testing/Procedures:  Your physician has recommended that you wear a Zio monitor.   This monitor is a medical device that records the heart's electrical activity. Doctors most often use these monitors to diagnose arrhythmias. Arrhythmias are problems with the speed or rhythm of the heartbeat. The monitor is a small device applied to your chest. You can wear one while you do your normal daily activities. While wearing this monitor if you have any symptoms to push the button and record what you felt. Once you have worn this monitor for the period of time provider prescribed (Usually 14 days), you will return the monitor device in the postage paid box. Once it is returned they will download the data collected and provide [place] with a report which the provider will then review and we will call you with those results. Important tips:  Avoid showering during the first 24 hours of wearing the monitor. Avoid excessive sweating to help maximize wear time. Do not submerge the device, no hot tubs, and no swimming pools. Keep any lotions or oils away from the patch. After 24 hours you may shower with the patch on. Take brief showers with your back facing the shower head.  Do not remove patch once it has been placed because that will interrupt data and decrease adhesive wear time. Push the button  when you have any symptoms and write down what you were feeling. Once you have completed wearing your monitor, remove and place into box which has postage paid and place in your outgoing mailbox.  If for some reason you have misplaced your box then call our office and we can provide another box and/or mail it off for you.    Follow-Up: At [facility], you and your health needs are our priority.  As part of our continuing mission to provide you with exceptional heart care, we have created designated Provider Care Teams.  These Care Teams include your primary Cardiologist (physician) and Advanced Practice Providers (APPs -  Physician Assistants and Nurse Practitioners) who all work together to provide you with the care you need, when you need it.  We recommend signing up for the patient portal called "MyChart".  Sign up information is provided on this After Visit Summary.  MyChart is used to connect with patients for Virtual Visits (Telemedicine).  Patients are able to view lab/test results, encounter notes, upcoming appointments, etc.  Non-urgent messages can be sent to your provider as well.   To learn more about what you can do with MyChart, go to [email or web address].    Your next appointment:   3 month(s)  Provider:   You may see Debbe Odea, MD or one of the following Advanced Practice Providers on your designated Care Team:   Nicolasa Ducking, NP Eula Listen, PA-C Cadence Fransico Michael, PA-C Charlsie Quest, NP Carlos Levering, NP

## 2023-05-06 ENCOUNTER — Ambulatory Visit: Payer: Self-pay | Admitting: *Deleted

## 2023-05-06 NOTE — Patient Outreach (Signed)
  Care Coordination   05/06/2023 Name: Alexandra Foster MRN: [id number] DOB: July 03, 1950   Care Coordination Outreach Attempts:  An unsuccessful outreach was attempted for an appointment today.  Follow Up Plan:  Additional outreach attempts will be made to offer the patient complex care management information and services.   Encounter Outcome:  No Answer   Care Coordination Interventions:  No, not indicated    Rodney Langton, RN, MSN, CCM [place]  [facility], [facility] RN Care Coordinator Direct Dial: [phone] / Main [phone] Fax [phone] Email: [email or web address]: [email or web address]

## 2023-05-07 DIAGNOSIS — Z431 Encounter for attention to gastrostomy: Secondary | ICD-10-CM

## 2023-05-07 DIAGNOSIS — J690 Pneumonitis due to inhalation of food and vomit: Secondary | ICD-10-CM | POA: Diagnosis not present

## 2023-05-07 DIAGNOSIS — D631 Anemia in chronic kidney disease: Secondary | ICD-10-CM

## 2023-05-07 DIAGNOSIS — I120 Hypertensive chronic kidney disease with stage 5 chronic kidney disease or end stage renal disease: Secondary | ICD-10-CM | POA: Diagnosis not present

## 2023-05-07 DIAGNOSIS — I480 Paroxysmal atrial fibrillation: Secondary | ICD-10-CM

## 2023-05-07 DIAGNOSIS — E43 Unspecified severe protein-calorie malnutrition: Secondary | ICD-10-CM

## 2023-05-07 DIAGNOSIS — E2740 Unspecified adrenocortical insufficiency: Secondary | ICD-10-CM

## 2023-05-07 DIAGNOSIS — N186 End stage renal disease: Secondary | ICD-10-CM | POA: Diagnosis not present

## 2023-05-07 DIAGNOSIS — Z992 Dependence on renal dialysis: Secondary | ICD-10-CM

## 2023-05-07 DIAGNOSIS — L89312 Pressure ulcer of right buttock, stage 2: Secondary | ICD-10-CM

## 2023-05-07 DIAGNOSIS — L89892 Pressure ulcer of other site, stage 2: Secondary | ICD-10-CM

## 2023-05-07 DIAGNOSIS — E1122 Type 2 diabetes mellitus with diabetic chronic kidney disease: Secondary | ICD-10-CM | POA: Diagnosis not present

## 2023-05-08 ENCOUNTER — Ambulatory Visit: Payer: Medicare Other | Admitting: Family Medicine

## 2023-05-12 ENCOUNTER — Other Ambulatory Visit: Payer: Self-pay

## 2023-05-12 ENCOUNTER — Encounter: Payer: Self-pay | Admitting: Intensive Care

## 2023-05-12 ENCOUNTER — Emergency Department: Payer: Medicare Other

## 2023-05-12 ENCOUNTER — Ambulatory Visit: Payer: Self-pay

## 2023-05-12 DIAGNOSIS — K7460 Unspecified cirrhosis of liver: Secondary | ICD-10-CM | POA: Diagnosis present

## 2023-05-12 DIAGNOSIS — K9423 Gastrostomy malfunction: Secondary | ICD-10-CM | POA: Diagnosis not present

## 2023-05-12 DIAGNOSIS — N390 Urinary tract infection, site not specified: Secondary | ICD-10-CM | POA: Diagnosis present

## 2023-05-12 DIAGNOSIS — J449 Chronic obstructive pulmonary disease, unspecified: Secondary | ICD-10-CM | POA: Diagnosis present

## 2023-05-12 DIAGNOSIS — E785 Hyperlipidemia, unspecified: Secondary | ICD-10-CM | POA: Diagnosis present

## 2023-05-12 DIAGNOSIS — L299 Pruritus, unspecified: Secondary | ICD-10-CM | POA: Diagnosis not present

## 2023-05-12 DIAGNOSIS — Z888 Allergy status to other drugs, medicaments and biological substances status: Secondary | ICD-10-CM

## 2023-05-12 DIAGNOSIS — D631 Anemia in chronic kidney disease: Secondary | ICD-10-CM | POA: Diagnosis present

## 2023-05-12 DIAGNOSIS — F32A Depression, unspecified: Secondary | ICD-10-CM | POA: Diagnosis present

## 2023-05-12 DIAGNOSIS — Z992 Dependence on renal dialysis: Secondary | ICD-10-CM

## 2023-05-12 DIAGNOSIS — E875 Hyperkalemia: Secondary | ICD-10-CM | POA: Diagnosis not present

## 2023-05-12 DIAGNOSIS — N186 End stage renal disease: Secondary | ICD-10-CM | POA: Diagnosis present

## 2023-05-12 DIAGNOSIS — I1311 Hypertensive heart and chronic kidney disease without heart failure, with stage 5 chronic kidney disease, or end stage renal disease: Secondary | ICD-10-CM | POA: Diagnosis present

## 2023-05-12 DIAGNOSIS — J690 Pneumonitis due to inhalation of food and vomit: Secondary | ICD-10-CM | POA: Diagnosis present

## 2023-05-12 DIAGNOSIS — J9601 Acute respiratory failure with hypoxia: Secondary | ICD-10-CM | POA: Diagnosis not present

## 2023-05-12 DIAGNOSIS — I480 Paroxysmal atrial fibrillation: Secondary | ICD-10-CM | POA: Diagnosis present

## 2023-05-12 DIAGNOSIS — D75839 Thrombocytosis, unspecified: Secondary | ICD-10-CM | POA: Diagnosis present

## 2023-05-12 DIAGNOSIS — G9341 Metabolic encephalopathy: Secondary | ICD-10-CM | POA: Diagnosis present

## 2023-05-12 DIAGNOSIS — A4150 Gram-negative sepsis, unspecified: Secondary | ICD-10-CM | POA: Diagnosis not present

## 2023-05-12 DIAGNOSIS — R6520 Severe sepsis without septic shock: Secondary | ICD-10-CM | POA: Diagnosis present

## 2023-05-12 DIAGNOSIS — E1122 Type 2 diabetes mellitus with diabetic chronic kidney disease: Secondary | ICD-10-CM | POA: Diagnosis present

## 2023-05-12 DIAGNOSIS — I2510 Atherosclerotic heart disease of native coronary artery without angina pectoris: Secondary | ICD-10-CM | POA: Diagnosis present

## 2023-05-12 DIAGNOSIS — Z87891 Personal history of nicotine dependence: Secondary | ICD-10-CM

## 2023-05-12 DIAGNOSIS — Z79899 Other long term (current) drug therapy: Secondary | ICD-10-CM

## 2023-05-12 DIAGNOSIS — E2740 Unspecified adrenocortical insufficiency: Secondary | ICD-10-CM | POA: Diagnosis present

## 2023-05-12 DIAGNOSIS — L89312 Pressure ulcer of right buttock, stage 2: Secondary | ICD-10-CM | POA: Diagnosis present

## 2023-05-12 DIAGNOSIS — F419 Anxiety disorder, unspecified: Secondary | ICD-10-CM | POA: Diagnosis present

## 2023-05-12 DIAGNOSIS — Z881 Allergy status to other antibiotic agents status: Secondary | ICD-10-CM

## 2023-05-12 DIAGNOSIS — Z89612 Acquired absence of left leg above knee: Secondary | ICD-10-CM

## 2023-05-12 DIAGNOSIS — Z8249 Family history of ischemic heart disease and other diseases of the circulatory system: Secondary | ICD-10-CM

## 2023-05-12 DIAGNOSIS — Z91158 Patient's noncompliance with renal dialysis for other reason: Secondary | ICD-10-CM

## 2023-05-12 DIAGNOSIS — G4733 Obstructive sleep apnea (adult) (pediatric): Secondary | ICD-10-CM | POA: Diagnosis present

## 2023-05-12 DIAGNOSIS — R197 Diarrhea, unspecified: Secondary | ICD-10-CM | POA: Diagnosis not present

## 2023-05-12 DIAGNOSIS — E669 Obesity, unspecified: Secondary | ICD-10-CM | POA: Diagnosis present

## 2023-05-12 DIAGNOSIS — K219 Gastro-esophageal reflux disease without esophagitis: Secondary | ICD-10-CM | POA: Diagnosis present

## 2023-05-12 DIAGNOSIS — D721 Eosinophilia, unspecified: Secondary | ICD-10-CM | POA: Diagnosis present

## 2023-05-12 DIAGNOSIS — Z6830 Body mass index (BMI) 30.0-30.9, adult: Secondary | ICD-10-CM

## 2023-05-12 DIAGNOSIS — Z66 Do not resuscitate: Secondary | ICD-10-CM | POA: Diagnosis present

## 2023-05-12 LAB — CBC
HCT: 45.3 % (ref 36.0–46.0)
Hemoglobin: 14.3 g/dL (ref 12.0–15.0)
MCH: 26 pg (ref 26.0–34.0)
MCHC: 31.6 g/dL (ref 30.0–36.0)
MCV: 82.4 fL (ref 80.0–100.0)
Platelets: 353 10*3/uL (ref 150–400)
RBC: 5.5 MIL/uL — ABNORMAL HIGH (ref 3.87–5.11)
RDW: 16.3 % — ABNORMAL HIGH (ref 11.5–15.5)
WBC: 20.8 10*3/uL — ABNORMAL HIGH (ref 4.0–10.5)
nRBC: 0.1 % (ref 0.0–0.2)

## 2023-05-12 LAB — RESP PANEL BY RT-PCR (RSV, FLU A&B, COVID)  RVPGX2
Influenza A by PCR: NEGATIVE
Influenza B by PCR: NEGATIVE
Resp Syncytial Virus by PCR: NEGATIVE
SARS Coronavirus 2 by RT PCR: NEGATIVE

## 2023-05-12 LAB — TROPONIN I (HIGH SENSITIVITY): Troponin I (High Sensitivity): 21 ng/L — ABNORMAL HIGH (ref <18)

## 2023-05-12 NOTE — Telephone Encounter (Signed)
  Chief Complaint: Trazodone not effective Symptoms: only sleeping 30 minutes Frequency: 1-2 months Pertinent Negatives: Patient denies  Disposition: [] ED /[] Urgent Care (no appt availability in office) / [] Appointment(In office/virtual)/ []  [place] [facility]/ [] Home Care/ [] Refused Recommended Disposition /[] [place] [facility]/ [x]  Follow-up with PCP Additional Notes: Returned call to pt's daughter. Daughter states that the Trazodone has never been all that effective in helping pt sleep. Pt would sleep 2-3 hours, but in the past 2 months pt is not falling asleep and if she does she only stays asleep for 30 minutes.  Daughter is wondering if dosage can be increased or if there is another medication that can be prescribed. Please advise.    Summary: Rx dosage increase requested   Daughter, [name] calling in requesting increase in dosage for traZODone (DESYREL) 50 MG tablet, pt is not sleeping well. This has been going on for about a month.  Please reach out to daughter to assist.         Reason for Disposition  [1] Caller has NON-URGENT medicine question about med that PCP prescribed AND [2] triager unable to answer question  Answer Assessment - Initial Assessment Questions 1. NAME of MEDICINE: "What medicine(s) are you calling about?"     Trazodone 50mg  2. QUESTION: "What is your question?" (e.g., double dose of medicine, side effect)     Not working for sleep 3. PRESCRIBER: "Who prescribed the medicine?" Reason: if prescribed by specialist, call should be referred to that group.     Dr. Carlynn Purl 4. SYMPTOMS: "Do you have any symptoms?" If Yes, ask: "What symptoms are you having?"  "How bad are the symptoms (e.g., mild, moderate, severe)     NO sleeping  Protocols used: Medication Question Call-A-AH

## 2023-05-12 NOTE — ED Notes (Signed)
Pts daughter stated Pt was wet and needed changing. This tech took Pt into waiting room D on stretcher and changed pt. Pt had heavily urine soaked and large solid BM in brief. While Pt was on right side, Pt began to vomit up mucus. This tech completed Peri care, put new brief on pt, new chux on bed, new sheet, changed pt out into hospital gown, and slid Pt up in bed with help of NT Tracey.  Pt is now dry and comfortably resting in bed. Pts belongings were put in pt belongings bag and given to Pt daughter.

## 2023-05-12 NOTE — ED Triage Notes (Signed)
First nurse note: Patient brought in by [facility] from dialysis. Finished treatment. Patients family wanted her sent to hospital after treatment for AMS X4 days.  EMS reports A&O x4 with them  Patient gets feeding tube treatments  EMS vitals: 160CBG 114/72 b/p 90HR 94% RA  Hx COPD and diabetes  Hx amputation right leg

## 2023-05-12 NOTE — ED Provider Triage Note (Signed)
Emergency Medicine Provider Triage Evaluation Note  Alexandra Foster, a 73 y.o. female  was evaluated in triage.  Pt complains of with a history of ESRD, COPD, HTN, GERD, HTN presents after completing  dialysis. Daughter, who is primary caregiver gives report of 1 weeks of NV, cough, chills, and 3 days AMS, intermittently. She has a history of aspiration pneumonia.   Review of Systems  Positive: AMS, cough, NV Negative: FCS  Physical Exam  BP (!) 138/116 (BP Location: Left Arm)   Pulse 93   Temp 97.6 F (36.4 C) (Oral)   Resp 18   Ht 5\' 4"  (1.626 m)   Wt 79.4 kg   SpO2 91%   BMI 30.04 kg/m  Gen:   Awake, no distress  NAD Resp:  Normal effort.  rhonchi noteed bilaterally MSK:   Moves extremities without difficulty  Other:    Medical Decision Making  Medically screening exam initiated at 7:36 PM.  Appropriate orders placed.  Alexandra Foster was informed that the remainder of the evaluation will be completed by another provider, this initial triage assessment does not replace that evaluation, and the importance of remaining in the ED until their evaluation is complete.  Geriatric patient to the ED from dialysis. She has had reports of cough, NV, and concern for PNA from family, with a history of the same.    Lissa Hoard, PA-C 05/12/23 1945

## 2023-05-12 NOTE — ED Notes (Signed)
Lab called to send phlebotomist.

## 2023-05-13 ENCOUNTER — Inpatient Hospital Stay: Payer: Medicare Other

## 2023-05-13 ENCOUNTER — Encounter: Payer: Self-pay | Admitting: Family Medicine

## 2023-05-13 ENCOUNTER — Inpatient Hospital Stay
Admission: EM | Admit: 2023-05-13 | Discharge: 2023-05-27 | DRG: 871 | Disposition: A | Discharge status: Home Health | Payer: Medicare Other | Attending: Internal Medicine | Admitting: Internal Medicine

## 2023-05-13 DIAGNOSIS — J189 Pneumonia, unspecified organism: Secondary | ICD-10-CM | POA: Diagnosis not present

## 2023-05-13 DIAGNOSIS — I9589 Other hypotension: Secondary | ICD-10-CM

## 2023-05-13 DIAGNOSIS — J690 Pneumonitis due to inhalation of food and vomit: Secondary | ICD-10-CM | POA: Diagnosis present

## 2023-05-13 DIAGNOSIS — Z89612 Acquired absence of left leg above knee: Secondary | ICD-10-CM | POA: Diagnosis not present

## 2023-05-13 DIAGNOSIS — J9601 Acute respiratory failure with hypoxia: Secondary | ICD-10-CM | POA: Diagnosis present

## 2023-05-13 DIAGNOSIS — I5033 Acute on chronic diastolic (congestive) heart failure: Secondary | ICD-10-CM | POA: Diagnosis not present

## 2023-05-13 DIAGNOSIS — E785 Hyperlipidemia, unspecified: Secondary | ICD-10-CM | POA: Diagnosis present

## 2023-05-13 DIAGNOSIS — L299 Pruritus, unspecified: Secondary | ICD-10-CM | POA: Diagnosis present

## 2023-05-13 DIAGNOSIS — F32A Depression, unspecified: Secondary | ICD-10-CM | POA: Diagnosis present

## 2023-05-13 DIAGNOSIS — K9423 Gastrostomy malfunction: Secondary | ICD-10-CM | POA: Diagnosis not present

## 2023-05-13 DIAGNOSIS — D721 Eosinophilia, unspecified: Secondary | ICD-10-CM | POA: Diagnosis present

## 2023-05-13 DIAGNOSIS — R6520 Severe sepsis without septic shock: Secondary | ICD-10-CM | POA: Diagnosis present

## 2023-05-13 DIAGNOSIS — N390 Urinary tract infection, site not specified: Secondary | ICD-10-CM | POA: Diagnosis present

## 2023-05-13 DIAGNOSIS — E875 Hyperkalemia: Secondary | ICD-10-CM | POA: Diagnosis not present

## 2023-05-13 DIAGNOSIS — Z931 Gastrostomy status: Secondary | ICD-10-CM

## 2023-05-13 DIAGNOSIS — D75839 Thrombocytosis, unspecified: Secondary | ICD-10-CM

## 2023-05-13 DIAGNOSIS — Z66 Do not resuscitate: Secondary | ICD-10-CM | POA: Diagnosis present

## 2023-05-13 DIAGNOSIS — E1122 Type 2 diabetes mellitus with diabetic chronic kidney disease: Secondary | ICD-10-CM | POA: Diagnosis present

## 2023-05-13 DIAGNOSIS — I1311 Hypertensive heart and chronic kidney disease without heart failure, with stage 5 chronic kidney disease, or end stage renal disease: Secondary | ICD-10-CM | POA: Diagnosis present

## 2023-05-13 DIAGNOSIS — I959 Hypotension, unspecified: Secondary | ICD-10-CM | POA: Diagnosis present

## 2023-05-13 DIAGNOSIS — F419 Anxiety disorder, unspecified: Secondary | ICD-10-CM | POA: Diagnosis not present

## 2023-05-13 DIAGNOSIS — N186 End stage renal disease: Secondary | ICD-10-CM

## 2023-05-13 DIAGNOSIS — Z992 Dependence on renal dialysis: Secondary | ICD-10-CM

## 2023-05-13 DIAGNOSIS — A4150 Gram-negative sepsis, unspecified: Secondary | ICD-10-CM | POA: Diagnosis present

## 2023-05-13 DIAGNOSIS — L89312 Pressure ulcer of right buttock, stage 2: Secondary | ICD-10-CM | POA: Diagnosis present

## 2023-05-13 DIAGNOSIS — I480 Paroxysmal atrial fibrillation: Secondary | ICD-10-CM | POA: Diagnosis present

## 2023-05-13 DIAGNOSIS — A419 Sepsis, unspecified organism: Secondary | ICD-10-CM | POA: Diagnosis present

## 2023-05-13 DIAGNOSIS — B9689 Other specified bacterial agents as the cause of diseases classified elsewhere: Secondary | ICD-10-CM | POA: Diagnosis not present

## 2023-05-13 DIAGNOSIS — G9341 Metabolic encephalopathy: Secondary | ICD-10-CM | POA: Diagnosis present

## 2023-05-13 DIAGNOSIS — R4182 Altered mental status, unspecified: Secondary | ICD-10-CM

## 2023-05-13 DIAGNOSIS — J449 Chronic obstructive pulmonary disease, unspecified: Secondary | ICD-10-CM | POA: Diagnosis present

## 2023-05-13 DIAGNOSIS — E669 Obesity, unspecified: Secondary | ICD-10-CM | POA: Diagnosis present

## 2023-05-13 DIAGNOSIS — R197 Diarrhea, unspecified: Secondary | ICD-10-CM | POA: Diagnosis not present

## 2023-05-13 DIAGNOSIS — D631 Anemia in chronic kidney disease: Secondary | ICD-10-CM | POA: Diagnosis present

## 2023-05-13 DIAGNOSIS — K7460 Unspecified cirrhosis of liver: Secondary | ICD-10-CM | POA: Diagnosis present

## 2023-05-13 DIAGNOSIS — D72829 Elevated white blood cell count, unspecified: Secondary | ICD-10-CM | POA: Diagnosis not present

## 2023-05-13 DIAGNOSIS — L8992 Pressure ulcer of unspecified site, stage 2: Secondary | ICD-10-CM | POA: Diagnosis present

## 2023-05-13 DIAGNOSIS — G4700 Insomnia, unspecified: Secondary | ICD-10-CM

## 2023-05-13 DIAGNOSIS — D638 Anemia in other chronic diseases classified elsewhere: Secondary | ICD-10-CM | POA: Diagnosis present

## 2023-05-13 DIAGNOSIS — J181 Lobar pneumonia, unspecified organism: Secondary | ICD-10-CM

## 2023-05-13 DIAGNOSIS — E2740 Unspecified adrenocortical insufficiency: Secondary | ICD-10-CM | POA: Diagnosis present

## 2023-05-13 LAB — CORTISOL-AM, BLOOD: Cortisol - AM: 46.9 ug/dL — ABNORMAL HIGH (ref 6.7–22.6)

## 2023-05-13 LAB — URINALYSIS, ROUTINE W REFLEX MICROSCOPIC
RBC / HPF: >50 RBC/hpf (ref 0–5)
Squamous Epithelial / HPF: 0 /[HPF] (ref 0–5)
WBC, UA: >50 WBC/hpf (ref 0–5)

## 2023-05-13 LAB — LACTIC ACID, PLASMA
Lactic Acid, Venous: 1.7 mmol/L (ref 0.5–1.9)
Lactic Acid, Venous: 2.3 mmol/L (ref 0.5–1.9)

## 2023-05-13 LAB — CBC
HCT: 46 % (ref 36.0–46.0)
Hemoglobin: 13.8 g/dL (ref 12.0–15.0)
MCH: 26 pg (ref 26.0–34.0)
MCHC: 30 g/dL (ref 30.0–36.0)
MCV: 86.6 fL (ref 80.0–100.0)
Platelets: 332 10*3/uL (ref 150–400)
RBC: 5.31 MIL/uL — ABNORMAL HIGH (ref 3.87–5.11)
RDW: 16.2 % — ABNORMAL HIGH (ref 11.5–15.5)
WBC: 5.6 10*3/uL (ref 4.0–10.5)
nRBC: 0.5 % — ABNORMAL HIGH (ref 0.0–0.2)

## 2023-05-13 LAB — BASIC METABOLIC PANEL
Anion gap: 12 (ref 5–15)
BUN: 30 mg/dL — ABNORMAL HIGH (ref 8–23)
CO2: 20 mmol/L — ABNORMAL LOW (ref 22–32)
Calcium: 8.6 mg/dL — ABNORMAL LOW (ref 8.9–10.3)
Chloride: 103 mmol/L (ref 98–111)
Creatinine, Ser: 3.95 mg/dL — ABNORMAL HIGH (ref 0.44–1.00)
GFR, Estimated: 11 mL/min — ABNORMAL LOW (ref >60)
Glucose, Bld: 123 mg/dL — ABNORMAL HIGH (ref 70–99)
Potassium: 4.1 mmol/L (ref 3.5–5.1)
Sodium: 135 mmol/L (ref 135–145)

## 2023-05-13 LAB — COMPREHENSIVE METABOLIC PANEL
ALT: 64 U/L — ABNORMAL HIGH (ref 0–44)
AST: 81 U/L — ABNORMAL HIGH (ref 15–41)
Albumin: 3.2 g/dL — ABNORMAL LOW (ref 3.5–5.0)
Alkaline Phosphatase: 190 U/L — ABNORMAL HIGH (ref 38–126)
Anion gap: 21 — ABNORMAL HIGH (ref 5–15)
BUN: 26 mg/dL — ABNORMAL HIGH (ref 8–23)
CO2: 21 mmol/L — ABNORMAL LOW (ref 22–32)
Calcium: 9.5 mg/dL (ref 8.9–10.3)
Chloride: 94 mmol/L — ABNORMAL LOW (ref 98–111)
Creatinine, Ser: 3.61 mg/dL — ABNORMAL HIGH (ref 0.44–1.00)
GFR, Estimated: 13 mL/min — ABNORMAL LOW (ref >60)
Glucose, Bld: 142 mg/dL — ABNORMAL HIGH (ref 70–99)
Potassium: 4.7 mmol/L (ref 3.5–5.1)
Sodium: 136 mmol/L (ref 135–145)
Total Bilirubin: 0.9 mg/dL (ref 0.0–1.2)
Total Protein: 8.9 g/dL — ABNORMAL HIGH (ref 6.5–8.1)

## 2023-05-13 LAB — PROTIME-INR
INR: 1.1 (ref 0.8–1.2)
Prothrombin Time: 14.8 s (ref 11.4–15.2)

## 2023-05-13 LAB — TROPONIN I (HIGH SENSITIVITY): Troponin I (High Sensitivity): 10 ng/L (ref <18)

## 2023-05-13 MED ORDER — SODIUM CHLORIDE 0.9 % IV BOLUS
500.0000 mL | Freq: Once | INTRAVENOUS | Status: AC
  Administered 2023-05-13: 500 mL via INTRAVENOUS

## 2023-05-13 MED ORDER — HYDROXYZINE HCL 50 MG PO TABS
50.0000 mg | ORAL_TABLET | Freq: Once | ORAL | Status: DC
  Filled 2023-05-13: qty 1

## 2023-05-13 MED ORDER — SODIUM CHLORIDE 0.9 % IV SOLN
500.0000 mg | INTRAVENOUS | Status: DC
  Administered 2023-05-13 – 2023-05-15 (×3): 500 mg via INTRAVENOUS
  Filled 2023-05-13 (×4): qty 5

## 2023-05-13 MED ORDER — METOCLOPRAMIDE HCL 10 MG PO TABS
5.0000 mg | ORAL_TABLET | Freq: Three times a day (TID) | ORAL | Status: DC
  Administered 2023-05-13: 5 mg via ORAL
  Filled 2023-05-13: qty 1

## 2023-05-13 MED ORDER — FLUDROCORTISONE ACETATE 0.1 MG PO TABS
0.1000 mg | ORAL_TABLET | Freq: Every day | ORAL | Status: DC
  Administered 2023-05-13: 0.1 mg via ORAL
  Filled 2023-05-13: qty 1

## 2023-05-13 MED ORDER — ZINC OXIDE 40 % EX OINT: TOPICAL_OINTMENT | Freq: Three times a day (TID) | CUTANEOUS | Status: DC | PRN

## 2023-05-13 MED ORDER — HYDROCOD POLI-CHLORPHE POLI ER 10-8 MG/5ML PO SUER
5.0000 mL | Freq: Two times a day (BID) | ORAL | Status: DC | PRN
  Administered 2023-05-14: 5 mL
  Filled 2023-05-13: qty 5

## 2023-05-13 MED ORDER — ACETAMINOPHEN 500 MG PO TABS
1000.0000 mg | ORAL_TABLET | Freq: Once | ORAL | Status: DC
  Filled 2023-05-13: qty 2

## 2023-05-13 MED ORDER — CHOLESTYRAMINE 4 G PO PACK
4.0000 g | PACK | Freq: Three times a day (TID) | ORAL | Status: DC
  Administered 2023-05-13 (×2): 4 g
  Filled 2023-05-13 (×3): qty 1

## 2023-05-13 MED ORDER — LORAZEPAM 0.5 MG PO TABS: 0.5000 mg | ORAL_TABLET | ORAL | Status: DC | PRN

## 2023-05-13 MED ORDER — MIDODRINE HCL 5 MG PO TABS
10.0000 mg | ORAL_TABLET | Freq: Three times a day (TID) | ORAL | Status: DC
  Administered 2023-05-13: 10 mg via ORAL
  Filled 2023-05-13: qty 2

## 2023-05-13 MED ORDER — HEPARIN SODIUM (PORCINE) 5000 UNIT/ML IJ SOLN
5000.0000 [IU] | Freq: Three times a day (TID) | INTRAMUSCULAR | Status: DC
  Administered 2023-05-13 – 2023-05-27 (×41): 5000 [IU] via SUBCUTANEOUS
  Filled 2023-05-13 (×40): qty 1

## 2023-05-13 MED ORDER — HYDROCOD POLI-CHLORPHE POLI ER 10-8 MG/5ML PO SUER: 5.0000 mL | Freq: Two times a day (BID) | ORAL | Status: DC | PRN

## 2023-05-13 MED ORDER — TRAZODONE HCL 50 MG PO TABS: 25.0000 mg | ORAL_TABLET | Freq: Every evening | ORAL | Status: DC | PRN

## 2023-05-13 MED ORDER — FREE WATER
30.0000 mL | Status: DC
  Administered 2023-05-13 – 2023-05-27 (×78): 30 mL
  Filled 2023-05-13 (×9): qty 30

## 2023-05-13 MED ORDER — ACETAMINOPHEN 650 MG RE SUPP
650.0000 mg | Freq: Four times a day (QID) | RECTAL | Status: DC | PRN
  Administered 2023-05-13: 650 mg via RECTAL
  Filled 2023-05-13: qty 1

## 2023-05-13 MED ORDER — NEPRO/CARBSTEADY PO LIQD
1120.0000 mL | ORAL | Status: DC
Start: 2023-05-13 — End: 2023-05-13

## 2023-05-13 MED ORDER — ATORVASTATIN CALCIUM 20 MG PO TABS
20.0000 mg | ORAL_TABLET | Freq: Every day | ORAL | Status: DC
  Administered 2023-05-13 – 2023-05-14 (×2): 20 mg
  Filled 2023-05-13 (×2): qty 1

## 2023-05-13 MED ORDER — RISAQUAD PO CAPS
2.0000 | ORAL_CAPSULE | Freq: Three times a day (TID) | ORAL | Status: DC
Start: 2023-05-13 — End: 2023-05-13

## 2023-05-13 MED ORDER — HYDROXYZINE HCL 50 MG PO TABS
50.0000 mg | ORAL_TABLET | Freq: Three times a day (TID) | ORAL | Status: DC
  Administered 2023-05-14 (×2): 50 mg
  Filled 2023-05-13: qty 2
  Filled 2023-05-13: qty 1

## 2023-05-13 MED ORDER — CLONAZEPAM 0.5 MG PO TABS: 0.5000 mg | ORAL_TABLET | Freq: Two times a day (BID) | ORAL | Status: DC | PRN

## 2023-05-13 MED ORDER — HYDROXYZINE HCL 10 MG/5ML PO SYRP: 50.0000 mg | ORAL_SOLUTION | Freq: Once | ORAL | Status: DC

## 2023-05-13 MED ORDER — METOCLOPRAMIDE HCL 5 MG PO TABS
5.0000 mg | ORAL_TABLET | Freq: Three times a day (TID) | ORAL | Status: DC
Start: 2023-05-13 — End: 2023-05-14
  Administered 2023-05-13 – 2023-05-14 (×4): 5 mg
  Filled 2023-05-13 (×5): qty 1

## 2023-05-13 MED ORDER — GUAIFENESIN ER 600 MG PO TB12
600.0000 mg | ORAL_TABLET | Freq: Two times a day (BID) | ORAL | Status: DC
  Filled 2023-05-13 (×2): qty 1

## 2023-05-13 MED ORDER — RENA-VITE PO TABS
1.0000 | ORAL_TABLET | Freq: Every day | ORAL | Status: DC
  Filled 2023-05-13: qty 1

## 2023-05-13 MED ORDER — LOPERAMIDE HCL 2 MG PO CAPS: 2.0000 mg | ORAL_CAPSULE | Freq: Three times a day (TID) | ORAL | Status: DC | PRN

## 2023-05-13 MED ORDER — ACETAMINOPHEN 325 MG PO TABS: 650.0000 mg | ORAL_TABLET | Freq: Four times a day (QID) | ORAL | Status: DC | PRN

## 2023-05-13 MED ORDER — HYDROXYZINE HCL 25 MG PO TABS: 50.0000 mg | ORAL_TABLET | Freq: Three times a day (TID) | ORAL | Status: DC

## 2023-05-13 MED ORDER — FAMOTIDINE 20 MG PO TABS
10.0000 mg | ORAL_TABLET | Freq: Every day | ORAL | Status: DC
  Administered 2023-05-13 – 2023-05-14 (×2): 10 mg
  Filled 2023-05-13 (×2): qty 1

## 2023-05-13 MED ORDER — NEPRO/CARBSTEADY PO LIQD
1000.0000 mL | ORAL | Status: DC
  Administered 2023-05-14 – 2023-05-25 (×5): 1000 mL

## 2023-05-13 MED ORDER — LOPERAMIDE HCL 1 MG/7.5ML PO SUSP: 2.0000 mg | Freq: Three times a day (TID) | ORAL | Status: DC | PRN

## 2023-05-13 MED ORDER — LORAZEPAM 0.5 MG PO TABS
0.5000 mg | ORAL_TABLET | ORAL | Status: DC | PRN
  Administered 2023-05-13: 0.5 mg via ORAL
  Filled 2023-05-13: qty 1

## 2023-05-13 MED ORDER — HYDROXYZINE HCL 10 MG/5ML PO SYRP
50.0000 mg | ORAL_SOLUTION | Freq: Once | ORAL | Status: DC
  Filled 2023-05-13: qty 25

## 2023-05-13 MED ORDER — HYDROXYZINE HCL 10 MG PO TABS
10.0000 mg | ORAL_TABLET | Freq: Three times a day (TID) | ORAL | Status: DC | PRN
  Filled 2023-05-13: qty 1

## 2023-05-13 MED ORDER — NYSTATIN 100000 UNIT/GM EX POWD
Freq: Three times a day (TID) | CUTANEOUS | Status: DC
  Filled 2023-05-13 (×4): qty 15

## 2023-05-13 MED ORDER — HYDROXYZINE HCL 50 MG PO TABS
50.0000 mg | ORAL_TABLET | Freq: Once | ORAL | Status: AC
Start: 2023-05-13 — End: 2023-05-13
  Administered 2023-05-13: 50 mg

## 2023-05-13 MED ORDER — ONDANSETRON HCL 4 MG/2ML IJ SOLN: 4.0000 mg | Freq: Four times a day (QID) | INTRAMUSCULAR | Status: DC | PRN

## 2023-05-13 MED ORDER — FLUDROCORTISONE ACETATE 0.1 MG PO TABS
0.1000 mg | ORAL_TABLET | Freq: Every day | ORAL | Status: DC
  Filled 2023-05-13: qty 1

## 2023-05-13 MED ORDER — VANCOMYCIN HCL IN DEXTROSE 1-5 GM/200ML-% IV SOLN
1000.0000 mg | Freq: Once | INTRAVENOUS | Status: AC
  Administered 2023-05-13: 1000 mg via INTRAVENOUS
  Filled 2023-05-13: qty 200

## 2023-05-13 MED ORDER — IPRATROPIUM-ALBUTEROL 0.5-2.5 (3) MG/3ML IN SOLN
3.0000 mL | Freq: Four times a day (QID) | RESPIRATORY_TRACT | Status: DC
  Administered 2023-05-13 – 2023-05-15 (×8): 3 mL via RESPIRATORY_TRACT
  Filled 2023-05-13 (×8): qty 3

## 2023-05-13 MED ORDER — NEPRO/CARBSTEADY PO LIQD
1000.0000 mL | ORAL | Status: DC
  Administered 2023-05-13: 1000 mL

## 2023-05-13 MED ORDER — ONDANSETRON HCL 4 MG PO TABS: 4.0000 mg | ORAL_TABLET | Freq: Four times a day (QID) | ORAL | Status: DC | PRN

## 2023-05-13 MED ORDER — SODIUM CHLORIDE 0.9 % IV SOLN
2.0000 g | INTRAVENOUS | Status: DC
  Administered 2023-05-13 – 2023-05-15 (×3): 2 g via INTRAVENOUS
  Filled 2023-05-13 (×4): qty 20

## 2023-05-13 MED ORDER — VITAMIN D 25 MCG (1000 UNIT) PO TABS
2000.0000 [IU] | ORAL_TABLET | Freq: Every day | ORAL | Status: DC
  Administered 2023-05-13 – 2023-05-14 (×2): 2000 [IU]
  Filled 2023-05-13 (×2): qty 2

## 2023-05-13 MED ORDER — NEPRO/CARBSTEADY PO LIQD: 1000.0000 mL | ORAL | Status: DC

## 2023-05-13 MED ORDER — ACETAMINOPHEN 650 MG RE SUPP: 650.0000 mg | Freq: Four times a day (QID) | RECTAL | Status: DC | PRN

## 2023-05-13 MED ORDER — MAGNESIUM HYDROXIDE 400 MG/5ML PO SUSP: 30.0000 mL | Freq: Every day | ORAL | Status: DC | PRN

## 2023-05-13 MED ORDER — ENOXAPARIN SODIUM 40 MG/0.4ML IJ SOSY: 40.0000 mg | PREFILLED_SYRINGE | INTRAMUSCULAR | Status: DC

## 2023-05-13 MED ORDER — SODIUM CHLORIDE 0.9 % IV SOLN
2.0000 g | Freq: Once | INTRAVENOUS | Status: AC
  Administered 2023-05-13: 2 g via INTRAVENOUS
  Filled 2023-05-13: qty 12.5

## 2023-05-13 MED ORDER — ACETAMINOPHEN 500 MG PO TABS
1000.0000 mg | ORAL_TABLET | Freq: Once | ORAL | Status: AC
  Administered 2023-05-13: 1000 mg

## 2023-05-13 MED ORDER — MIDODRINE HCL 5 MG PO TABS
10.0000 mg | ORAL_TABLET | Freq: Three times a day (TID) | ORAL | Status: DC
  Administered 2023-05-13 – 2023-05-14 (×3): 10 mg
  Filled 2023-05-13 (×4): qty 2

## 2023-05-13 MED ORDER — IOHEXOL 350 MG/ML SOLN
100.0000 mL | Freq: Once | INTRAVENOUS | Status: AC | PRN
  Administered 2023-05-13: 100 mL via INTRAVENOUS

## 2023-05-13 MED ORDER — ESCITALOPRAM OXALATE 10 MG PO TABS
10.0000 mg | ORAL_TABLET | Freq: Every day | ORAL | Status: DC
  Administered 2023-05-13 – 2023-05-14 (×2): 10 mg
  Filled 2023-05-13 (×2): qty 1

## 2023-05-13 MED ORDER — VITAMIN C 500 MG PO TABS
250.0000 mg | ORAL_TABLET | Freq: Every day | ORAL | Status: DC
  Administered 2023-05-13 – 2023-05-14 (×2): 250 mg
  Filled 2023-05-13 (×2): qty 1

## 2023-05-13 MED ORDER — LACTATED RINGERS IV SOLN
150.0000 mL/h | INTRAVENOUS | Status: DC
  Administered 2023-05-13: 150 mL/h via INTRAVENOUS

## 2023-05-13 NOTE — Assessment & Plan Note (Addendum)
On midodrine and florinef for chronically low blood pressure.

## 2023-05-13 NOTE — ED Notes (Signed)
[place] 2.3 provider aware

## 2023-05-13 NOTE — ED Notes (Addendum)
Pt back from CT; Increase leakage noted at PEG tube site , this RN notified attending Wieting MD. Awaiting orders  , will continue to monitor.

## 2023-05-13 NOTE — ED Notes (Addendum)
Attending Steward Drone NP notified of pt BP and rectal temp

## 2023-05-13 NOTE — ED Notes (Signed)
Ice bags placed under arms and groin, attending Steward Drone NP aware.

## 2023-05-13 NOTE — Progress Notes (Signed)
  Progress Note   Patient: Alexandra Foster ZOX:[id number] DOB: [birth year] DOA: 05/13/2023     0 DOS: the patient was seen and examined on 05/13/2023   Brief hospital course: 73 year old female with end-stage renal disease on hemodialysis, osteoarthritis, asthma, COPD, type 2 diabetes mellitus, GERD, hypertension and paroxysmal atrial fibrillation and obstructive sleep apnea on CPAP presented to the ER with confusion over the last 3 days with intermittent vomiting.  She was admitted with clinical sepsis secondary to right upper lobe pneumonia likely aspiration and urinary infection.  Started on antibiotic.  1/29.  Patient has some cough and shortness of breath.  Does not wear oxygen at home.  Assessment and Plan: * Sepsis due to gram-negative UTI (HCC) Sepsis present on admission with leukocytosis, fever, tachycardia and tachypnea.  Patient has right upper lobe pneumonia and positive urine analysis.  Follow-up urine and blood cultures.  On Rocephin for urinary infection and Rocephin and Zithromax for pneumonia.   Aspiration pneumonia (HCC) Right upper lobar pneumonia.  Suspect aspiration.  Continue Rocephin and Zithromax.  Acute metabolic encephalopathy Mental status improved.  ESRD on hemodialysis [facility]) Nephrology consult  Dyslipidemia On Lipitor  Itching On atarax standing dose  Hypotension On midodrine and florinef  Obesity (BMI 30-39.9) BMI 30.04  Anxiety and depression On Klonopin and Lexapro.        Subjective: Patient feels some shortness of breath and some cough.  Initially told me that she eats but patient's daughter states that all the feeding is through the tube and nothing by mouth.  Admitted with sepsis UTI and pneumonia.  Physical Exam: Vitals:   05/13/23 0700 05/13/23 0705 05/13/23 0725 05/13/23 1022  BP: (!) 126/101 (!) 147/115 (!) 147/115 (!) 91/52  Pulse: 92 91 91 (!) 108  Resp: (!) 31 (!) 27 (!) 22 20  Temp:   99.4 F (37.4 C)   TempSrc:       SpO2: 93% 91% 98% 94%  Weight:      Height:       Physical Exam HENT:     Head: Normocephalic.  Eyes:     General: Lids are normal.     Conjunctiva/sclera: Conjunctivae normal.  Cardiovascular:     Rate and Rhythm: Normal rate and regular rhythm.     Heart sounds: Normal heart sounds, S1 normal and S2 normal.  Pulmonary:     Breath sounds: No decreased breath sounds, wheezing, rhonchi or rales.  Abdominal:     Palpations: Abdomen is soft.     Tenderness: There is no abdominal tenderness.  Musculoskeletal:     Right lower leg: No swelling.     Left lower leg: No swelling.  Skin:    General: Skin is warm.     Findings: No rash.  Neurological:     Mental Status: She is alert.     Data Reviewed: Platelets count 5.6, hemoglobin 13.8, platelet count 332, lactic acid 1.7, creatinine 3.95  Family Communication: Spoke with daughter on the phone  Disposition: Status is: Inpatient Requiring IV antibiotics  Planned Discharge Destination: Home with Home Health    Time spent: 28 minutes  Author: Alford Highland, MD 05/13/2023 12:50 PM  For on call review [email or web address].

## 2023-05-13 NOTE — ED Notes (Signed)
This RN and NT to change pt, Mepilex dressing applied to sacrum

## 2023-05-13 NOTE — ED Provider Notes (Signed)
[facility] Provider Note    Event Date/Time   First MD Initiated Contact with Patient 05/13/23 0028     (approximate)   History   Altered Mental Status   HPI  Alexandra Foster is a 73 y.o. female   who presents to the emergency department today because of concerns for altered mental status.  Patient is unable to give history.  History is obtained from family at bedside.  They have noticed over the past roughly 3 days patient has had increased confusion.  Additionally she has had intermittent vomiting.  Patient has had similar symptoms in the past with right-sided urinary tract infection and aspiration pneumonia.  No fevers noticed at home.  Patient does have history of dialysis today.  Dialysis today.  Had another episode of vomiting today at dialysis.      Physical Exam   Triage Vital Signs: ED Triage Vitals  Encounter Vitals Group     BP 05/12/23 1608 (!) 138/116     Systolic BP Percentile --      Diastolic BP Percentile --      Pulse Rate 05/12/23 1608 93     Resp 05/12/23 1608 18     Temp 05/12/23 1608 97.6 F (36.4 C)     Temp Source 05/12/23 1608 Oral     SpO2 05/12/23 1608 90 %     Weight 05/12/23 1634 175 lb (79.4 kg)     Height 05/12/23 1634 5\' 4"  (1.626 m)     Head Circumference --      Peak Flow --      Pain Score 05/12/23 1634 0     Pain Loc --      Pain Education --      Exclude from Growth Chart --     Most recent vital signs: Vitals:   05/12/23 1638 05/13/23 0020  BP:  (!) 126/105  Pulse:  96  Resp:    Temp:  (!) 100.4 F (38 C)  SpO2: 91% 91%   General: Awake, alert, not oriented. CV:  Good peripheral perfusion. Regular rate and rhythm. Resp:  Normal effort. Lungs clear. Abd:  No distention. Non tender.   ED Results / Procedures / Treatments   Labs (all labs ordered are listed, but only abnormal results are displayed) Labs Reviewed  CBC - Abnormal; Notable for the following components:      Result Value    WBC 20.8 (*)    RBC 5.50 (*)    RDW 16.3 (*)    All other components within normal limits  COMPREHENSIVE METABOLIC PANEL - Abnormal; Notable for the following components:   Chloride 94 (*)    CO2 21 (*)    Glucose, Bld 142 (*)    BUN 26 (*)    Creatinine, Ser 3.61 (*)    Total Protein 8.9 (*)    Albumin 3.2 (*)    AST 81 (*)    ALT 64 (*)    Alkaline Phosphatase 190 (*)    GFR, Estimated 13 (*)    Anion gap 21 (*)    All other components within normal limits  LACTIC ACID, PLASMA - Abnormal; Notable for the following components:   Lactic Acid, Venous 2.3 (*)    All other components within normal limits  URINALYSIS, ROUTINE W REFLEX MICROSCOPIC - Abnormal; Notable for the following components:   Color, Urine RED (*)    APPearance TURBID (*)    Glucose, UA   (*)  Value: TEST NOT REPORTED DUE TO COLOR INTERFERENCE OF URINE PIGMENT   Hgb urine dipstick   (*)    Value: TEST NOT REPORTED DUE TO COLOR INTERFERENCE OF URINE PIGMENT   Bilirubin Urine   (*)    Value: TEST NOT REPORTED DUE TO COLOR INTERFERENCE OF URINE PIGMENT   Ketones, ur   (*)    Value: TEST NOT REPORTED DUE TO COLOR INTERFERENCE OF URINE PIGMENT   Protein, ur   (*)    Value: TEST NOT REPORTED DUE TO COLOR INTERFERENCE OF URINE PIGMENT   Nitrite   (*)    Value: TEST NOT REPORTED DUE TO COLOR INTERFERENCE OF URINE PIGMENT   Leukocytes,Ua   (*)    Value: TEST NOT REPORTED DUE TO COLOR INTERFERENCE OF URINE PIGMENT   Bacteria, UA MANY (*)    All other components within normal limits  TROPONIN I (HIGH SENSITIVITY) - Abnormal; Notable for the following components:   Troponin I (High Sensitivity) 21 (*)    All other components within normal limits  RESP PANEL BY RT-PCR (RSV, FLU A&B, COVID)  RVPGX2  CULTURE, BLOOD (ROUTINE X 2)  CULTURE, BLOOD (ROUTINE X 2)  LACTIC ACID, PLASMA  TROPONIN I (HIGH SENSITIVITY)     EKG  I, Phineas Semen, attending physician, personally viewed and interpreted this EKG  EKG  Time: 1615 Rate: 91 Rhythm: normal sinus rhythm Axis: left axis deviation Intervals: qtc 504 QRS: RBBB ST changes: no st elevation Impression: abnormal ekg   RADIOLOGY I independently interpreted and visualized the CXR. My interpretation: Haziness to right lung Radiology interpretation:  IMPRESSION:  1. Hazy opacity projects over the right upper lobe. Some of this is  rotational and cause by soft tissues of the chest wall and  asymmetric inclusion of the scapula, but accounting for this  possibility of right upper lobe bronchopneumonia or asymmetric edema  is not excluded.  2. Dialysis catheter tip: Lower SVC.     PROCEDURES:  Critical Care performed: Yes  CRITICAL CARE Performed by: Phineas Semen   Total critical care time: 30 minutes  Critical care time was exclusive of separately billable procedures and treating other patients.  Critical care was necessary to treat or prevent imminent or life-threatening deterioration.  Critical care was time spent personally by me on the following activities: development of treatment plan with patient and/or surrogate as well as nursing, discussions with consultants, evaluation of patient's response to treatment, examination of patient, obtaining history from patient or surrogate, ordering and performing treatments and interventions, ordering and review of laboratory studies, ordering and review of radiographic studies, pulse oximetry and re-evaluation of patient's condition.   Procedures    MEDICATIONS ORDERED IN ED: Medications  ceFEPIme (MAXIPIME) 2 g in sodium chloride 0.9 % 100 mL IVPB (has no administration in time range)  vancomycin (VANCOCIN) IVPB 1000 mg/200 mL premix (has no administration in time range)  sodium chloride 0.9 % bolus 500 mL (has no administration in time range)  acetaminophen (TYLENOL) tablet 1,000 mg (has no administration in time range)     IMPRESSION / MDM / ASSESSMENT AND PLAN / ED COURSE  I  reviewed the triage vital signs and the nursing notes.                              Differential diagnosis includes, but is not limited to, UTI, pneumonia, anemia, CVA  Patient's presentation is most consistent with acute presentation  with potential threat to life or bodily function.   Patient presented to the emergency department today because of concerns for altered mental status (, blood work here is concerning for elevated white count and lactic acidosis.  Patient did get a well-perfused.  Emergency department.  Chest x-ray concerning for possible pneumonia.  UA concerning for urinary tract infection. Will start broad spectrum IV abx. Discussed with Dr. Arville Care with the hospitalist service who will evaluate for admission.      FINAL CLINICAL IMPRESSION(S) / ED DIAGNOSES   Final diagnoses:  Lower urinary tract infectious disease  Altered mental status, unspecified altered mental status type  Pneumonia due to infectious organism, unspecified laterality, unspecified part of lung      Note:  This document was prepared using Dragon voice recognition software and may include unintentional dictation errors.    Phineas Semen, MD 05/13/23 [phone]

## 2023-05-13 NOTE — ED Notes (Signed)
Pt a difficulty stick, unable to obtain IV access at this time, IV team to be consulted, order to be placed.

## 2023-05-13 NOTE — ED Notes (Signed)
IV team at the bedside for PIV x2

## 2023-05-13 NOTE — Assessment & Plan Note (Addendum)
On Lipitor

## 2023-05-13 NOTE — ED Notes (Signed)
DG Abdomen resulted per Steward Drone NP, Resume feed and medications

## 2023-05-13 NOTE — ED Notes (Signed)
Attending MD Wieting aware of pt BP

## 2023-05-13 NOTE — Progress Notes (Signed)
[facility]  ROUNDING NOTE   Subjective:   Alexandra Foster is a 73 y.o. female with history of hypertension, coronary artery disease, congestive heart failure, COPD, cirrhosis, diabetes, status post left AKA, status post G-tube placement, and end stage renal disease on hemodialysis.  Patient presents to ED from dialysis at the request of family due to altered mental status.  Patient has been admitted for Sepsis due to gram-negative UTI (HCC) [A41.50, N39.0]  Patient is known to our practice from previous admissions and receives outpatient dialysis treatments at [facility] on a TTS schedule, supervised by [facility].  Patient did receive full dialysis treatment Tuesday prior to ED arrival.  Patient seen laying in bed.  No family present.  Chart review states patient had altered mental status for 4 days prior to presentation.  Family also reported vomiting and notes the symptoms were similar from when she had a UTI and pneumonia previously.  Labs on ED arrival unremarkable for renal patient who recently completed dialysis.  White count is elevated 20.8.  Lactic acid 2.3.  Respiratory panel negative for influenza, COVID-19, and RSV.  Urinalysis turbid, red with bacteria.  Awaiting urine culture.  Chest x-ray shows questionable right upper lobe bronchopneumonia.  We have been consulted to manage dialysis needs during this admission.   Objective:  Vital signs in last 24 hours:  Temp:  [97.6 F (36.4 C)-100.4 F (38 C)] 98.9 F (37.2 C) (01/29 1543) Pulse Rate:  [80-108] 108 (01/29 1022) Resp:  [18-31] 20 (01/29 1022) BP: (91-147)/(52-116) 91/52 (01/29 1022) SpO2:  [86 %-98 %] 94 % (01/29 1022) Weight:  [79.4 kg] 79.4 kg (01/28 1634)  Weight change:  Filed Weights   05/12/23 1634  Weight: 79.4 kg    Intake/Output: I/O last 3 completed shifts: In: 600 [IV Piggyback:600] Out: -    Intake/Output this shift:  Total I/O In: 1330.4 [I.V.:1000; NG/GT:3; IV  Piggyback:327.4] Out: -   Physical Exam: General: NAD  Head: Normocephalic, atraumatic. Moist oral mucosal membranes  Eyes: Anicteric  Lungs:  Clear to auscultation, normal effort  Heart: Regular rate and rhythm  Abdomen:  Soft, nontender, obese  Extremities:  trace peripheral edema.  Neurologic: Somnolent, moving all four extremities  Skin: No lesions  Access: Rt chest permcath     Basic Metabolic Panel: Recent Labs  Lab 05/12/23 2345 05/13/23 0522  NA 136 135  K 4.7 4.1  CL 94* 103  CO2 21* 20*  GLUCOSE 142* 123*  BUN 26* 30*  CREATININE 3.61* 3.95*  CALCIUM 9.5 8.6*    Liver Function Tests: Recent Labs  Lab 05/12/23 2345  AST 81*  ALT 64*  ALKPHOS 190*  BILITOT 0.9  PROT 8.9*  ALBUMIN 3.2*   No results for input(s): "LIPASE", "AMYLASE" in the last 168 hours. No results for input(s): "AMMONIA" in the last 168 hours.  CBC: Recent Labs  Lab 05/12/23 1743 05/13/23 0522  WBC 20.8* 5.6  HGB 14.3 13.8  HCT 45.3 46.0  MCV 82.4 86.6  PLT 353 332    Cardiac Enzymes: No results for input(s): "CKTOTAL", "CKMB", "CKMBINDEX", "TROPONINI" in the last 168 hours.  BNP: Invalid input(s): "POCBNP"  CBG: No results for input(s): "GLUCAP" in the last 168 hours.  Microbiology: Results for orders placed or performed during the hospital encounter of 05/13/23  Resp panel by RT-PCR (RSV, Flu A&B, Covid) Anterior Nasal Swab     Status: None   Collection Time: 05/12/23  4:37 PM   Specimen: Anterior  Nasal Swab  Result Value Ref Range Status   SARS Coronavirus 2 by RT PCR NEGATIVE NEGATIVE Final    Comment: (NOTE) SARS-CoV-2 target nucleic acids are NOT DETECTED.  The SARS-CoV-2 RNA is generally detectable in upper respiratory specimens during the acute phase of infection. The lowest concentration of SARS-CoV-2 viral copies this assay can detect is 138 copies/mL. A negative result does not preclude SARS-Cov-2 infection and should not be used as the sole basis for  treatment or other patient management decisions. A negative result may occur with  improper specimen collection/handling, submission of specimen other than nasopharyngeal swab, presence of viral mutation(s) within the areas targeted by this assay, and inadequate number of viral copies(<138 copies/mL). A negative result must be combined with clinical observations, patient history, and epidemiological information. The expected result is Negative.  Fact Sheet for Patients:  BloggerCourse.com  Fact Sheet for Healthcare Providers:  SeriousBroker.it  This test is no t yet approved or cleared by the [place] FDA and  has been authorized for detection and/or diagnosis of SARS-CoV-2 by FDA under an Emergency Use Authorization (EUA). This EUA will remain  in effect (meaning this test can be used) for the duration of the COVID-19 declaration under Section 564(b)(1) of the Act, 21 U.S.C.section 360bbb-3(b)(1), unless the authorization is terminated  or revoked sooner.       Influenza A by PCR NEGATIVE NEGATIVE Final   Influenza B by PCR NEGATIVE NEGATIVE Final    Comment: (NOTE) The Xpert Xpress SARS-CoV-2/FLU/RSV plus assay is intended as an aid in the diagnosis of influenza from Nasopharyngeal swab specimens and should not be used as a sole basis for treatment. Nasal washings and aspirates are unacceptable for Xpert Xpress SARS-CoV-2/FLU/RSV testing.  Fact Sheet for Patients: BloggerCourse.com  Fact Sheet for Healthcare Providers: SeriousBroker.it  This test is not yet approved or cleared by the [place] FDA and has been authorized for detection and/or diagnosis of SARS-CoV-2 by FDA under an Emergency Use Authorization (EUA). This EUA will remain in effect (meaning this test can be used) for the duration of the COVID-19 declaration under Section 564(b)(1) of the Act, 21  U.S.C. section 360bbb-3(b)(1), unless the authorization is terminated or revoked.     Resp Syncytial Virus by PCR NEGATIVE NEGATIVE Final    Comment: (NOTE) Fact Sheet for Patients: BloggerCourse.com  Fact Sheet for Healthcare Providers: SeriousBroker.it  This test is not yet approved or cleared by the [place] FDA and has been authorized for detection and/or diagnosis of SARS-CoV-2 by FDA under an Emergency Use Authorization (EUA). This EUA will remain in effect (meaning this test can be used) for the duration of the COVID-19 declaration under Section 564(b)(1) of the Act, 21 U.S.C. section 360bbb-3(b)(1), unless the authorization is terminated or revoked.  Performed at [facility], [address] [place], [place] [address]   Culture, blood (routine x 2)     Status: None (Preliminary result)   Collection Time: 05/12/23 11:45 PM   Specimen: BLOOD  Result Value Ref Range Status   Specimen Description BLOOD RIGHT ANTECUBITAL  Final   Special Requests   Final    BOTTLES DRAWN AEROBIC AND ANAEROBIC Blood Culture adequate volume   Culture   Final    NO GROWTH < 12 HOURS Performed at [facility], [address] [place], [place] [address]    Report Status PENDING  Incomplete  Culture, blood (routine x 2)     Status: None (Preliminary result)   Collection  Time: 05/13/23 12:00 AM   Specimen: BLOOD  Result Value Ref Range Status   Specimen Description BLOOD LEFT WRIST  Final   Special Requests   Final    AEROBIC BOTTLE ONLY Blood Culture results may not be optimal due to an inadequate volume of blood received in culture bottles   Culture   Final    NO GROWTH < 12 HOURS Performed at [facility], [address] [place], [place] [address]    Report Status PENDING  Incomplete    Coagulation Studies: Recent Labs    05/13/23 0522  LABPROT 14.8  INR 1.1    Urinalysis: Recent Labs     05/13/23 0010  COLORURINE RED*  LABSPEC TEST NOT REPORTED DUE TO COLOR INTERFERENCE OF URINE PIGMENT  PHURINE TEST NOT REPORTED DUE TO COLOR INTERFERENCE OF URINE PIGMENT  GLUCOSEU TEST NOT REPORTED DUE TO COLOR INTERFERENCE OF URINE PIGMENT*  HGBUR TEST NOT REPORTED DUE TO COLOR INTERFERENCE OF URINE PIGMENT*  BILIRUBINUR TEST NOT REPORTED DUE TO COLOR INTERFERENCE OF URINE PIGMENT*  KETONESUR TEST NOT REPORTED DUE TO COLOR INTERFERENCE OF URINE PIGMENT*  PROTEINUR TEST NOT REPORTED DUE TO COLOR INTERFERENCE OF URINE PIGMENT*  NITRITE TEST NOT REPORTED DUE TO COLOR INTERFERENCE OF URINE PIGMENT*  LEUKOCYTESUR TEST NOT REPORTED DUE TO COLOR INTERFERENCE OF URINE PIGMENT*      Imaging: DG Chest 1 View Result Date: 05/12/2023 CLINICAL DATA:  Shortness of breath EXAM: CHEST  1 VIEW COMPARISON:  03/06/2023 FINDINGS: The patient is rotated to the right on today's radiograph, reducing diagnostic sensitivity and specificity. Dialysis catheter tip: Lower SVC. Hazy opacity projects over the right upper lobe. Some of this is rotational and cause by soft tissues of the chest wall and asymmetric inclusion of the scapula, but accounting for this possibility of right upper lobe bronchopneumonia or asymmetric edema is not excluded. Left lung appears clear. Thoracic spondylosis. IMPRESSION: 1. Hazy opacity projects over the right upper lobe. Some of this is rotational and cause by soft tissues of the chest wall and asymmetric inclusion of the scapula, but accounting for this possibility of right upper lobe bronchopneumonia or asymmetric edema is not excluded. 2. Dialysis catheter tip: Lower SVC. Electronically Signed   By: Gaylyn Rong M.D.   On: 05/12/2023 17:29     Medications:    azithromycin Stopped (05/13/23 0515)   cefTRIAXone (ROCEPHIN)  IV Stopped (05/13/23 1232)   feeding supplement (NEPRO CARB STEADY) 45 mL/hr at 05/13/23 1246    acidophilus  2 capsule Oral TID   ascorbic acid  250 mg Per  Tube Daily   atorvastatin  20 mg Per Tube Daily   vitamin D3  2,000 Units Per Tube Daily   cholestyramine  4 g Per Tube TID   escitalopram  10 mg Per Tube Daily   famotidine  10 mg Per Tube Daily   [START ON 05/14/2023] fludrocortisone  0.1 mg Per Tube Daily   free water  30 mL Per Tube Q4H   guaiFENesin  600 mg Oral BID   heparin injection (subcutaneous)  5,000 Units Subcutaneous Q8H   hydrOXYzine  50 mg Oral TID   ipratropium-albuterol  3 mL Nebulization QID   metoCLOPramide  5 mg Oral TID AC & HS   midodrine  10 mg Per Tube TID with meals   multivitamin  1 tablet Oral QHS   nystatin   Topical TID   acetaminophen **OR** acetaminophen, chlorpheniramine-HYDROcodone, clonazePAM, liver oil-zinc oxide, loperamide, LORazepam, magnesium hydroxide, ondansetron **  OR** ondansetron (ZOFRAN) IV, traZODone  Assessment/ Plan:  Alexandra Foster is a 73 y.o.  female with history of hypertension, coronary artery disease, congestive heart failure, COPD, cirrhosis, diabetes, status post left AKA, status post G-tube placement, and end stage renal disease on hemodialysis.  Patient presents to ED from dialysis at the request of family due to altered mental status.  Patient has been admitted for Sepsis due to gram-negative UTI (HCC) [A41.50, N39.0]  [facility]/TTs/Rt permcath/75.6kg  End-stage renal disease: Last treatment was on Tuesday. Will plan next treatment on Thursday.   #2.  Sepsis, gram negative UTI. Received Lactated ringers in ED. Stopped. Primary team to manage IV Rocephin. Awaiting urine culture.    #3: Anemia of chronic kidney disease Lab Results  Component Value Date   HGB 13.8 05/13/2023    Hgb at desired range.    #4: Secondary Hyperparathyroidism: with outpatient labs: PTH 35, phosphorus 7.6, calcium 9.4 on 04/21/23.   Lab Results  Component Value Date   CALCIUM 8.6 (L) 05/13/2023   PHOS 3.2 03/08/2023    Will continue to monitor bone minerals during this admission.      LOS: 0 Geovana Gebel 1/29/20253:49 PM

## 2023-05-13 NOTE — Assessment & Plan Note (Addendum)
BMI 30.84

## 2023-05-13 NOTE — Assessment & Plan Note (Addendum)
On Klonopin and Lexapro.

## 2023-05-13 NOTE — Assessment & Plan Note (Addendum)
Multifocal pneumonia seen on CT scan. See Severe Sepsis

## 2023-05-13 NOTE — Assessment & Plan Note (Addendum)
On atarax standing dose and as needed Benadryl

## 2023-05-13 NOTE — ED Notes (Signed)
Per attending Wieting MD hold tube feeding now .

## 2023-05-13 NOTE — Progress Notes (Signed)
       CROSS COVER NOTE  NAME: Alexandra Foster MRN: [id number] DOB : 1951-03-13 ATTENDING PHYSICIAN: Alford Highland, MD    Date of Service   05/13/2023   HPI/Events of Note   Notified regarding leakage at peg tube site  Interventions   Assessment/Plan: Peg tube site appears normal, slight redness, not infected looking, no apparent abnormalities. Abdomen soft around site. Patient without nausea. No pain at site. Flange quite far from . No real leakage with flush. Attempted feed again and leakage again noted  Abdominal tube study xray ordered stat X X  Daughter at bedside during assessment. Findings and plan explained

## 2023-05-13 NOTE — Assessment & Plan Note (Addendum)
Mental status improving daily.

## 2023-05-13 NOTE — ED Notes (Signed)
Pt was desatting with good pleth to 86% this RN placed pt on 2L [place] now satting 92%

## 2023-05-13 NOTE — H&P (Addendum)
[place]   PATIENT NAME: Alexandra Foster    MR#:  [id number]  DATE OF BIRTH:  Mar 15, 1951  DATE OF ADMISSION:  05/13/2023  PRIMARY CARE PHYSICIAN: [name], MD   Patient is coming from: Home  REQUESTING/REFERRING PHYSICIAN: Phineas Semen, MD  CHIEF COMPLAINT:   Chief Complaint  Patient presents with   Altered Mental Status    HISTORY OF PRESENT ILLNESS:  Alexandra Foster is a 73 y.o. African American female with medical history significant for ESRD on HD, osteoarthritis, asthma, COPD, type 2 diabetes mellitus, GERD, hypertension and paroxysmal atrial fibrillation as well as OSA on CPAP, who presented to the ER with acute onset of altered mental status with confusion.  The patient has been having worsening confusion over the last 3 days with associated intermittent vomiting.  She had similar symptoms with UTI and aspiration pneumonia in the past.  No fever or chills were noted.  She was noted to have mild dyspnea without significant cough or wheezing.  No chest pain or palpitations.  She had his hemodialysis today and vomited again during dialysis.  No chest pain or palpitations.  ED Course: When he came to the ER, temperature was 100.4/38, BP 126/105 with pulse currently 91% and later 86% on room air.  Respiratory rate was normal and later 28.  And heart rate was up to 101.  See the seizures significant except is a 20.8.  Lactic acid was 2.3 and later 1.7.  CMP revealed hypochloremia, CO2 of 21 and glucose 142 with a BUN of 26 and creatinine 3.61 with anion gap of 21, alk phos 198 albumin 3.2 and total protein 8.9 with AST of 81 and ALT 64.  UA was positive for UTI and blood cultures and urine culture were sent. EKG as reviewed by me :  EKG showed normal sinus rhythm with a rate of 91 with sinus arrhythmia with left axis deviation, right bundle branch block. Imaging: Portable chest x-ray showed the following: 1. Hazy opacity projects over the right upper lobe. Some of  this is rotational and cause by soft tissues of the chest wall and asymmetric inclusion of the scapula, but accounting for this possibility of right upper lobe bronchopneumonia or asymmetric edema is not excluded. 2. Dialysis catheter tip: Lower SVC.  The patient was given IV cefepime and vancomycin, 500 mL IV normal saline bolus, Atarax 50 mg for itching and anxiety and 1 g p.o. Tylenol.  She will be admitted to a unit bed for further evaluation and management. PAST MEDICAL HISTORY:   Past Medical History:  Diagnosis Date   AKI (acute kidney injury) (HCC)    a. 04/2021 in setting of bacteremia/shock.   Arthritis    Asthma    Bacteremia    a. 04/2021 S pyogenes bacteremia in setting of lower ext cellulitis.   COPD (chronic obstructive pulmonary disease) (HCC)    Diabetes mellitus without complication (HCC)    Endometriosis    GERD (gastroesophageal reflux disease)    History of echocardiogram    a. 07/2013 Echo: EF 55-60%, impaired relaxation, mild TR; b. 04/2021 Echo: EF 50-55%, mild LVH, nl RV fxn, mild BAE, Ao sclerosis w/o stenosis.   Hypertension    Obesity    PAF (paroxysmal atrial fibrillation) (HCC)    a. 04/2021 in setting of septic shock/cellulitis.   Sleep apnea    CPAP  ESRD on HD  PAST SURGICAL HISTORY:   Past Surgical History:  Procedure Laterality Date  ABDOMINAL HYSTERECTOMY     APPLICATION OF WOUND VAC Left 05/17/2021   Procedure: APPLICATION OF WOUND VAC/WOUND VAC EXCHANGE-Matrix Myriad;  Surgeon: Carolan Shiver, MD;  Location: [facility];  Service: General;  Laterality: Left;   APPLICATION OF WOUND VAC  05/24/2021   Procedure: APPLICATION OF WOUND VAC;  Surgeon: Carolan Shiver, MD;  Location: [facility];  Service: General;;   APPLICATION OF WOUND VAC  05/10/2021   Procedure: APPLICATION OF WOUND VAC;  Surgeon: Carolan Shiver, MD;  Location: [facility];  Service: General;;   COLONOSCOPY  10/29/2006   Dr Servando Snare   COLONOSCOPY WITH PROPOFOL N/A  11/05/2016   Procedure: COLONOSCOPY WITH PROPOFOL;  Surgeon: Earline Mayotte, MD;  Location: [facility];  Service: Endoscopy;  Laterality: N/A;   DIALYSIS/PERMA CATHETER INSERTION N/A 12/19/2022   Procedure: DIALYSIS/PERMA CATHETER INSERTION;  Surgeon: Annice Needy, MD;  Location: [facility];  Service: Cardiovascular;  Laterality: N/A;   DIALYSIS/PERMA CATHETER INSERTION N/A 02/04/2023   Procedure: DIALYSIS/PERMA CATHETER INSERTION;  Surgeon: Annice Needy, MD;  Location: [facility];  Service: Cardiovascular;  Laterality: N/A;   DIALYSIS/PERMA CATHETER INSERTION N/A 02/16/2023   Procedure: DIALYSIS/PERMA CATHETER INSERTION;  Surgeon: Annice Needy, MD;  Location: [facility];  Service: Cardiovascular;  Laterality: N/A;   DIALYSIS/PERMA CATHETER INSERTION N/A 03/23/2023   Procedure: DIALYSIS/PERMA CATHETER INSERTION;  Surgeon: Annice Needy, MD;  Location: [facility];  Service: Cardiovascular;  Laterality: N/A;   INCISION AND DRAINAGE OF WOUND Left 05/24/2021   Procedure: IRRIGATION AND DEBRIDEMENT LEFT LEG;  Surgeon: Carolan Shiver, MD;  Location: [facility];  Service: General;  Laterality: Left;   INCISION AND DRAINAGE OF WOUND Left 05/10/2021   Procedure: IRRIGATION AND DEBRIDEMENT LEFT LEG;  Surgeon: Carolan Shiver, MD;  Location: [facility];  Service: General;  Laterality: Left;   IR FLUORO GUIDE CV LINE RIGHT  06/12/2021   IR PERC TUN PERIT CATH WO PORT S&I /IMAG  06/12/2021   IR RADIOLOGIST EVAL & MGMT  03/04/2023   IR REPLC GASTRO/COLONIC TUBE PERCUT W/FLUORO  06/12/2021   IVC FILTER INSERTION N/A 05/29/2021   Procedure: IVC FILTER INSERTION;  Surgeon: Renford Dills, MD;  Location: [facility];  Service: Cardiovascular;  Laterality: N/A;   MINOR GRAFT APPLICATION  05/24/2021   Procedure: Myriad Matrix  APPLICATION;  Surgeon: Carolan Shiver, MD;  Location: [facility];  Service: General;;   NASAL SINUS SURGERY  2002   Dr Chestine Spore    PEG PLACEMENT N/A 05/28/2021   Procedure: PERCUTANEOUS ENDOSCOPIC GASTROSTOMY (PEG) PLACEMENT;  Surgeon: Sung Amabile, DO;  Location: [facility];  Service: General;  Laterality: N/A;  TRAVEL CASE   TRACHEOSTOMY TUBE PLACEMENT N/A 05/17/2021   Procedure: TRACHEOSTOMY;  Surgeon: Geanie Logan, MD;  Location: [facility];  Service: ENT;  Laterality: N/A;   WOUND DEBRIDEMENT Left 05/07/2021   Procedure: DEBRIDEMENT WOUND;  Surgeon: Carolan Shiver, MD;  Location: [facility];  Service: General;  Laterality: Left;    SOCIAL HISTORY:   Social History   Tobacco Use   Smoking status: Former    Current packs/day: 0.00    Average packs/day: 2.0 packs/day for 40.0 years (80.0 ttl pk-yrs)    Types: Cigarettes    Start date: 12    Quit date: 2003    Years since quitting: 22.0   Smokeless tobacco: Former    Types: Snuff    Quit date: 04/2001   Tobacco comments:    smoking cessation materials  not required  Substance Use Topics   Alcohol use: No    Alcohol/week: 0.0 standard drinks of alcohol    FAMILY HISTORY:   Family History  Problem Relation Age of Onset   Congestive Heart Failure Mother    Coronary artery disease Father 89   Breast cancer Sister 58   Heart disease Brother    Varicose Veins Brother    Alcohol abuse Brother     DRUG ALLERGIES:   Allergies  Allergen Reactions   Chlorhexidine Dermatitis   Augmentin [Amoxicillin-Pot Clavulanate] Dermatitis    REVIEW OF SYSTEMS:   ROS As per history of present illness. All pertinent systems were reviewed above. Constitutional, HEENT, cardiovascular, respiratory, GI, GU, musculoskeletal, neuro, psychiatric, endocrine, integumentary and hematologic systems were reviewed and are otherwise negative/unremarkable except for positive findings mentioned above in the HPI.   MEDICATIONS AT HOME:   Prior to Admission medications   Medication Sig Start Date End Date Taking? Authorizing Provider  acetaminophen (TYLENOL) 325 MG tablet  Place 2 tablets (650 mg total) into feeding tube every 6 (six) hours as needed for mild pain (or Fever >/= 101). 12/29/22   Alford Highland, MD  ascorbic acid (VITAMIN C) 500 MG tablet Place 0.5 tablets (250 mg total) into feeding tube daily. 03/08/23   Delfino Lovett, MD  atorvastatin (LIPITOR) 20 MG tablet Place 1 tablet (20 mg total) into feeding tube daily. 03/20/23   Mecum, Erin E, PA-C  cholestyramine (QUESTRAN) 4 g packet Place 1 packet (4 g total) into feeding tube 3 (three) times daily. 04/01/23   Sowles, [name], MD  clonazePAM (KLONOPIN) 0.5 MG tablet TAKE 1/2 TO 1 (ONE-HALF TO ONE) TABLET BY MOUTH ONCE DAILY AS NEEDED FOR ANXIETY 04/23/23   Carlynn Purl, [name], MD  escitalopram (LEXAPRO) 10 MG tablet Place 1 tablet (10 mg total) into feeding tube daily. 03/20/23   Mecum, Erin E, PA-C  famotidine (PEPCID) 10 MG tablet Place 1 tablet (10 mg total) into feeding tube daily. 02/11/23   [name], MD  fludrocortisone (FLORINEF) 0.1 MG tablet PLACE TWO TABLETS INTO FEEDING TUBE DAILY 04/30/23   Carlynn Purl, [name], MD  glucose blood test strip Use 1 strip to check blood glucose levels 2 times daily 03/30/23   [name], MD  hydrOXYzine (ATARAX) 50 MG tablet Take 1 tablet by mouth three times daily as needed 04/23/23   [name], MD  liver oil-zinc oxide (DESITIN) 40 % ointment Apply topically 3 (three) times daily as needed for irritation. Apply to skin around G tube 3 times daily as needed for skin irritation 02/20/23   [name], MD  metoCLOPramide (REGLAN) 5 MG tablet TAKE 1 TABLET BY MOUTH THREE TIMES DAILY BEFORE MEAL(S) 04/13/23   [name], MD  midodrine (PROAMATINE) 10 MG tablet PLACE 2 TABLETS INTO FEEDING TUBE 3 TIMES DAILY WITH MEALS 04/24/23   [name], MD  multivitamin (RENA-VIT) TABS tablet PLACE ONE TABLET INTO FEEDING TUBE AT BEDTIME 04/21/23   Mecum, Erin E, PA-C  Nutritional Supplements (FEEDING SUPPLEMENT, NEPRO CARB STEADY,) LIQD Place 1,120 mLs into feeding tube  daily. 01/02/23   Enedina Finner, MD  nystatin (MYCOSTATIN/NYSTOP) powder Apply topically 3 (three) times daily. 12/29/22   Alford Highland, MD  traZODone (DESYREL) 50 MG tablet Take 0.5 tablets (25 mg total) by mouth at bedtime as needed for sleep. 02/11/23   [name], MD  vitamin D3 (CHOLECALCIFEROL) 25 MCG tablet Place 2 tablets (2,000 Units total) into feeding tube daily. 01/02/23   Enedina Finner, MD  Water For Irrigation, Sterile (FREE WATER) SOLN Place 50 mLs into feeding tube every 4 (four) hours. 12/29/22   Alford Highland, MD      VITAL SIGNS:  Blood pressure 104/77, pulse 80, temperature 99.4 F (37.4 C), temperature source Oral, resp. rate (!) 27, height 5\' 4"  (1.626 m), weight 79.4 kg, SpO2 93%.  PHYSICAL EXAMINATION:  Physical Exam  GENERAL:  73 y.o.-year-old African-American female patient lying in the bed with mild respiratory distress with conversational dyspnea. EYES: Pupils equal, round, reactive to light and accommodation. No scleral icterus. Extraocular muscles intact.  HEENT: Head atraumatic, normocephalic. Oropharynx and nasopharynx clear.  NECK:  Supple, no jugular venous distention. No thyroid enlargement, no tenderness.  LUNGS: Diminished breath midlung zone breath sounds with associated crackles.  No use of accessory muscles of respiration.  CARDIOVASCULAR: Regular rate and rhythm, S1, S2 normal. No murmurs, rubs, or gallops.  ABDOMEN: Soft, nondistended, nontender. Bowel sounds present. No organomegaly or mass.  EXTREMITIES: No pedal edema, cyanosis, or clubbing.  The patient has a left AKA. NEUROLOGIC: Cranial nerves II through XII are intact. Muscle strength 5/5 in all extremities. Sensation intact. Gait not checked.  PSYCHIATRIC: The patient is alert and oriented x 3.  Normal affect and good eye contact. SKIN: No obvious rash, lesion, or ulcer.   LABORATORY PANEL:   CBC Recent Labs  Lab 05/13/23 0522  WBC 5.6  HGB 13.8  HCT 46.0  PLT 332    ------------------------------------------------------------------------------------------------------------------  Chemistries  Recent Labs  Lab 05/12/23 2345 05/13/23 0522  NA 136 135  K 4.7 4.1  CL 94* 103  CO2 21* 20*  GLUCOSE 142* 123*  BUN 26* 30*  CREATININE 3.61* 3.95*  CALCIUM 9.5 8.6*  AST 81*  --   ALT 64*  --   ALKPHOS 190*  --   BILITOT 0.9  --    ------------------------------------------------------------------------------------------------------------------  Cardiac Enzymes No results for input(s): "TROPONINI" in the last 168 hours. ------------------------------------------------------------------------------------------------------------------  RADIOLOGY:  DG Chest 1 View Result Date: 05/12/2023 CLINICAL DATA:  Shortness of breath EXAM: CHEST  1 VIEW COMPARISON:  03/06/2023 FINDINGS: The patient is rotated to the right on today's radiograph, reducing diagnostic sensitivity and specificity. Dialysis catheter tip: Lower SVC. Hazy opacity projects over the right upper lobe. Some of this is rotational and cause by soft tissues of the chest wall and asymmetric inclusion of the scapula, but accounting for this possibility of right upper lobe bronchopneumonia or asymmetric edema is not excluded. Left lung appears clear. Thoracic spondylosis. IMPRESSION: 1. Hazy opacity projects over the right upper lobe. Some of this is rotational and cause by soft tissues of the chest wall and asymmetric inclusion of the scapula, but accounting for this possibility of right upper lobe bronchopneumonia or asymmetric edema is not excluded. 2. Dialysis catheter tip: Lower SVC. Electronically Signed   By: Gaylyn Rong M.D.   On: 05/12/2023 17:29      IMPRESSION AND PLAN:  Assessment and Plan: * Sepsis due to gram-negative UTI [facility]) - The patient will be admitted to a progressive unit bed. - We will continue antibiotic therapy with IV Rocephin. - We will follow urine culture. -  Will hydrate with IV normal saline. - Sepsis manifested by leukocytosis, fever, tachycardia and tachypnea. -The patient may meet severe sepsis criteria due to hypotension but she has chronic hypotension on midodrine..  Aspiration pneumonia (HCC) - This is likely the case due to recurrent vomiting in the setting of G-tube. - Will continue antibiotic therapy  with IV Rocephin and Zithromax. - We will follow blood cultures. - Mucolytic therapy will be provided as well as bronchodilator therapy.  Acute metabolic encephalopathy - This is like secondary to #1 and #2. - Management as above. - Will monitor mental status.  ESRD on hemodialysis [facility]) - Nephrology consult to be obtained to follow-up on dialysis. - I notified Dr. Thedore Mins about the patient.  Dyslipidemia - Continue statin therapy.  Anxiety and depression - We will continue Klonopin and Lexapro.     DVT prophylaxis: Lovenox.  Advanced Care Planning:  Code Status: The patient is DNR and DNI.  I discussed it with her. Family Communication:  The plan of care was discussed in details with the patient (and family). I answered all questions. The patient agreed to proceed with the above mentioned plan. Further management will depend upon hospital course. Disposition Plan: Back to previous home environment Consults called: none.  All the records are reviewed and case discussed with ED provider.  Status is: Inpatient   At the time of the admission, it appears that the appropriate admission status for this patient is inpatient.  This is judged to be reasonable and necessary in order to provide the required intensity of service to ensure the patient's safety given the presenting symptoms, physical exam findings and initial radiographic and laboratory data in the context of comorbid conditions.  The patient requires inpatient status due to high intensity of service, high risk of further deterioration and high frequency of surveillance  required.  I certify that at the time of admission, it is my clinical judgment that the patient will require inpatient hospital care extending more than 2 midnights.                            Dispo: The patient is from: Home              Anticipated d/c is to: Home              Patient currently is not medically stable to d/c.              Difficult to place patient: No  Hannah Beat M.D on 05/13/2023 at 6:43 AM  [facility]   From 7 PM-7 AM, contact night-coverage [email or web address]  CC: Primary care physician; [name], MD

## 2023-05-13 NOTE — ED Notes (Signed)
Per attending Steward Drone NP, Hold tube feeding d/t febrile at this time.

## 2023-05-13 NOTE — ED Notes (Signed)
NP to bedside

## 2023-05-13 NOTE — Assessment & Plan Note (Deleted)
Sepsis present on admission with leukocytosis, fever, tachycardia and tachypnea.  Patient has right upper lobe pneumonia and positive urine analysis.  Follow-up urine and blood cultures.  On Rocephin for urinary infection and Rocephin and Zithromax for pneumonia.

## 2023-05-13 NOTE — ED Notes (Signed)
Pt to CT

## 2023-05-13 NOTE — ED Notes (Signed)
Pt is very anxious and scratching all over. Pt is restless and yelling out for people to come scratch her. This RN attempted comfort measures and applied lotion to body parts that were irritated. Pt continues to complain and requesting to scratch her. Provider aware. PRN orders will be given.

## 2023-05-13 NOTE — Hospital Course (Addendum)
73 year old female with end-stage renal disease on hemodialysis, osteoarthritis, asthma, COPD, type 2 diabetes mellitus, GERD, hypertension and paroxysmal atrial fibrillation and obstructive sleep apnea on CPAP presented to the ER with confusion over the last 3 days with intermittent vomiting.  She was admitted with clinical sepsis secondary to right upper lobe pneumonia likely aspiration and urinary infection.  Started on antibiotic.  1/29.  some cough and shortness of breath.  Restarted tube feeds.  Fever of 103. 2/1.  Rapid response at dialysis.  Patient had large brown BM then was less responsive and has shortness of breath.  Bipap ordered.  ABG.  Iv albumin.  Spoke with nephrology to remove fluid.  CT scan showing worsening pneumonia.  Antibiotics switched to Zosyn and vancomycin 2/2.  Respiratory status much improved.  Off BiPAP on 3 L of oxygen.  BP lower side increased midodrine to 15 mg 3 times daily. 2/3. On 1 L O2.  Steroids discontinued. 2/4.  White blood cell count 30.1.  Diarrhea not documented.  I assumed care 05/20/23 WBC improved to 18.8k Pt remained on IV Zosyn through 2/6, stopped having completed total of 10 days antibiotic coverage. When tried off antibiotics, WBC again increased. ID was consulted and PO Zyvox was started for additional MRSA coverage pending repeat cultures and attempt to identify any infection source.  Blood cultures negative at day 4.  WBC is staying in low 20's but pt asymptomatic.    Getting hematology's input today and anticipate discharge home tomorrow.

## 2023-05-13 NOTE — Assessment & Plan Note (Addendum)
Dialysis today

## 2023-05-14 ENCOUNTER — Encounter: Payer: Self-pay | Admitting: Family Medicine

## 2023-05-14 DIAGNOSIS — N390 Urinary tract infection, site not specified: Secondary | ICD-10-CM | POA: Diagnosis not present

## 2023-05-14 DIAGNOSIS — A4150 Gram-negative sepsis, unspecified: Secondary | ICD-10-CM | POA: Diagnosis not present

## 2023-05-14 DIAGNOSIS — J690 Pneumonitis due to inhalation of food and vomit: Secondary | ICD-10-CM | POA: Diagnosis not present

## 2023-05-14 DIAGNOSIS — G9341 Metabolic encephalopathy: Secondary | ICD-10-CM | POA: Diagnosis not present

## 2023-05-14 DIAGNOSIS — E669 Obesity, unspecified: Secondary | ICD-10-CM

## 2023-05-14 LAB — RENAL FUNCTION PANEL
Albumin: 2.2 g/dL — ABNORMAL LOW (ref 3.5–5.0)
Anion gap: 15 (ref 5–15)
BUN: 51 mg/dL — ABNORMAL HIGH (ref 8–23)
CO2: 18 mmol/L — ABNORMAL LOW (ref 22–32)
Calcium: 8.9 mg/dL (ref 8.9–10.3)
Chloride: 101 mmol/L (ref 98–111)
Creatinine, Ser: 5.64 mg/dL — ABNORMAL HIGH (ref 0.44–1.00)
GFR, Estimated: 7 mL/min — ABNORMAL LOW (ref >60)
Glucose, Bld: 137 mg/dL — ABNORMAL HIGH (ref 70–99)
Phosphorus: 4 mg/dL (ref 2.5–4.6)
Potassium: 5.2 mmol/L — ABNORMAL HIGH (ref 3.5–5.1)
Sodium: 134 mmol/L — ABNORMAL LOW (ref 135–145)

## 2023-05-14 LAB — CBC
HCT: 42.1 % (ref 36.0–46.0)
Hemoglobin: 13.2 g/dL (ref 12.0–15.0)
MCH: 25.9 pg — ABNORMAL LOW (ref 26.0–34.0)
MCHC: 31.4 g/dL (ref 30.0–36.0)
MCV: 82.5 fL (ref 80.0–100.0)
Platelets: 432 10*3/uL — ABNORMAL HIGH (ref 150–400)
RBC: 5.1 MIL/uL (ref 3.87–5.11)
RDW: 15.9 % — ABNORMAL HIGH (ref 11.5–15.5)
WBC: 27.3 10*3/uL — ABNORMAL HIGH (ref 4.0–10.5)
nRBC: 0.1 % (ref 0.0–0.2)

## 2023-05-14 LAB — GLUCOSE, CAPILLARY
Glucose-Capillary: 125 mg/dL — ABNORMAL HIGH (ref 70–99)
Glucose-Capillary: 120 mg/dL — ABNORMAL HIGH (ref 70–99)

## 2023-05-14 LAB — MRSA NEXT GEN BY PCR, NASAL: MRSA by PCR Next Gen: DETECTED — AB

## 2023-05-14 MED ORDER — MIDODRINE HCL 5 MG PO TABS
10.0000 mg | ORAL_TABLET | Freq: Three times a day (TID) | ORAL | Status: DC
Start: 2023-05-14 — End: 2023-05-17
  Administered 2023-05-14 – 2023-05-17 (×8): 10 mg via ORAL
  Filled 2023-05-14 (×7): qty 2

## 2023-05-14 MED ORDER — FAMOTIDINE 20 MG PO TABS
10.0000 mg | ORAL_TABLET | Freq: Every day | ORAL | Status: DC
  Administered 2023-05-15 – 2023-05-24 (×9): 10 mg via ORAL
  Filled 2023-05-14 (×9): qty 1

## 2023-05-14 MED ORDER — FLUDROCORTISONE ACETATE 0.1 MG PO TABS
0.1000 mg | ORAL_TABLET | Freq: Every day | ORAL | Status: DC
  Administered 2023-05-15 – 2023-05-24 (×10): 0.1 mg via ORAL
  Filled 2023-05-14 (×11): qty 1

## 2023-05-14 MED ORDER — HEPARIN SODIUM (PORCINE) 1000 UNIT/ML DIALYSIS: 1000.0000 [IU] | INTRAMUSCULAR | Status: DC | PRN

## 2023-05-14 MED ORDER — HYDROXYZINE HCL 25 MG PO TABS
50.0000 mg | ORAL_TABLET | Freq: Three times a day (TID) | ORAL | Status: DC
  Administered 2023-05-14 – 2023-05-24 (×30): 50 mg via ORAL
  Filled 2023-05-14: qty 2
  Filled 2023-05-14: qty 1
  Filled 2023-05-14 (×5): qty 2
  Filled 2023-05-14 (×2): qty 1
  Filled 2023-05-14 (×9): qty 2
  Filled 2023-05-14: qty 1
  Filled 2023-05-14 (×12): qty 2

## 2023-05-14 MED ORDER — METOCLOPRAMIDE HCL 5 MG PO TABS
5.0000 mg | ORAL_TABLET | Freq: Three times a day (TID) | ORAL | Status: DC
  Administered 2023-05-14 – 2023-05-27 (×48): 5 mg via ORAL
  Filled 2023-05-14 (×51): qty 1

## 2023-05-14 MED ORDER — LOPERAMIDE HCL 1 MG/7.5ML PO SUSP
2.0000 mg | Freq: Three times a day (TID) | ORAL | Status: DC | PRN
  Administered 2023-05-17: 2 mg via ORAL
  Filled 2023-05-14 (×3): qty 15

## 2023-05-14 MED ORDER — DIPHENHYDRAMINE HCL 12.5 MG/5ML PO ELIX
12.5000 mg | ORAL_SOLUTION | Freq: Once | ORAL | Status: DC
Start: 2023-05-14 — End: 2023-05-14
  Filled 2023-05-14: qty 5

## 2023-05-14 MED ORDER — ESCITALOPRAM OXALATE 10 MG PO TABS
10.0000 mg | ORAL_TABLET | Freq: Every day | ORAL | Status: DC
Start: 2023-05-15 — End: 2023-05-25
  Administered 2023-05-15 – 2023-05-24 (×9): 10 mg via ORAL
  Filled 2023-05-14 (×9): qty 1

## 2023-05-14 MED ORDER — ALBUMIN HUMAN 25 % IV SOLN
25.0000 g | Freq: Once | INTRAVENOUS | Status: AC
  Administered 2023-05-14: 25 g via INTRAVENOUS

## 2023-05-14 MED ORDER — TRAZODONE HCL 50 MG PO TABS
25.0000 mg | ORAL_TABLET | Freq: Every evening | ORAL | Status: DC | PRN
  Administered 2023-05-14 – 2023-05-22 (×5): 25 mg via ORAL
  Filled 2023-05-14 (×5): qty 1

## 2023-05-14 MED ORDER — CHOLESTYRAMINE 4 G PO PACK
4.0000 g | PACK | Freq: Three times a day (TID) | ORAL | Status: DC
  Administered 2023-05-14 – 2023-05-24 (×29): 4 g via ORAL
  Filled 2023-05-14 (×32): qty 1

## 2023-05-14 MED ORDER — ONDANSETRON HCL 4 MG/2ML IJ SOLN: 4.0000 mg | Freq: Four times a day (QID) | INTRAMUSCULAR | Status: DC | PRN

## 2023-05-14 MED ORDER — LORAZEPAM 0.5 MG PO TABS
0.5000 mg | ORAL_TABLET | ORAL | Status: DC | PRN
  Administered 2023-05-14 – 2023-05-22 (×4): 0.5 mg via ORAL
  Filled 2023-05-14 (×4): qty 1

## 2023-05-14 MED ORDER — MUPIROCIN 2 % EX OINT
1.0000 | TOPICAL_OINTMENT | Freq: Two times a day (BID) | CUTANEOUS | Status: AC
  Administered 2023-05-14 – 2023-05-19 (×9): 1 via NASAL
  Filled 2023-05-14: qty 22

## 2023-05-14 MED ORDER — STERILE WATER FOR INJECTION IJ SOLN
INTRAMUSCULAR | Status: AC
  Filled 2023-05-14: qty 10

## 2023-05-14 MED ORDER — RENA-VITE PO TABS
1.0000 | ORAL_TABLET | Freq: Every day | ORAL | Status: DC
  Administered 2023-05-14: 1 via ORAL
  Filled 2023-05-14: qty 1

## 2023-05-14 MED ORDER — ACETAMINOPHEN 650 MG RE SUPP: 650.0000 mg | Freq: Four times a day (QID) | RECTAL | Status: DC | PRN

## 2023-05-14 MED ORDER — HYDROCOD POLI-CHLORPHE POLI ER 10-8 MG/5ML PO SUER: 5.0000 mL | Freq: Two times a day (BID) | ORAL | Status: DC | PRN

## 2023-05-14 MED ORDER — CHOLESTYRAMINE 4 G PO PACK
4.0000 g | PACK | Freq: Three times a day (TID) | ORAL | Status: DC
  Filled 2023-05-14 (×4): qty 1

## 2023-05-14 MED ORDER — ONDANSETRON HCL 4 MG PO TABS: 4.0000 mg | ORAL_TABLET | Freq: Four times a day (QID) | ORAL | Status: DC | PRN

## 2023-05-14 MED ORDER — ALTEPLASE 2 MG IJ SOLR
INTRAMUSCULAR | Status: AC
  Filled 2023-05-14: qty 4

## 2023-05-14 MED ORDER — RENA-VITE PO TABS
1.0000 | ORAL_TABLET | Freq: Every day | ORAL | Status: DC
  Filled 2023-05-14: qty 1

## 2023-05-14 MED ORDER — CHLORHEXIDINE GLUCONATE CLOTH 2 % EX PADS: 6.0000 | MEDICATED_PAD | Freq: Every day | CUTANEOUS | Status: DC

## 2023-05-14 MED ORDER — DIPHENHYDRAMINE HCL 12.5 MG/5ML PO ELIX
12.5000 mg | ORAL_SOLUTION | Freq: Three times a day (TID) | ORAL | Status: DC | PRN
  Administered 2023-05-14 – 2023-05-15 (×2): 12.5 mg
  Filled 2023-05-14 (×2): qty 5

## 2023-05-14 MED ORDER — ALTEPLASE 2 MG IJ SOLR
2.0000 mg | Freq: Once | INTRAMUSCULAR | Status: AC | PRN
  Administered 2023-05-14: 2 mg

## 2023-05-14 MED ORDER — VITAMIN D 25 MCG (1000 UNIT) PO TABS
2000.0000 [IU] | ORAL_TABLET | Freq: Every day | ORAL | Status: DC
  Administered 2023-05-15 – 2023-05-24 (×9): 2000 [IU] via ORAL
  Filled 2023-05-14 (×9): qty 2

## 2023-05-14 MED ORDER — ALBUMIN HUMAN 25 % IV SOLN
INTRAVENOUS | Status: AC
  Filled 2023-05-14: qty 100

## 2023-05-14 MED ORDER — ATORVASTATIN CALCIUM 20 MG PO TABS
20.0000 mg | ORAL_TABLET | Freq: Every day | ORAL | Status: DC
  Administered 2023-05-15 – 2023-05-24 (×9): 20 mg via ORAL
  Filled 2023-05-14 (×9): qty 1

## 2023-05-14 MED ORDER — VITAMIN C 500 MG PO TABS
250.0000 mg | ORAL_TABLET | Freq: Every day | ORAL | Status: DC
  Administered 2023-05-15 – 2023-05-24 (×10): 250 mg via ORAL
  Filled 2023-05-14 (×9): qty 1

## 2023-05-14 MED ORDER — ACETAMINOPHEN 325 MG PO TABS
650.0000 mg | ORAL_TABLET | Freq: Four times a day (QID) | ORAL | Status: DC | PRN
  Administered 2023-05-19 – 2023-05-26 (×5): 650 mg via ORAL
  Filled 2023-05-14 (×6): qty 2

## 2023-05-14 NOTE — ED Notes (Signed)
Cooling blanket applied

## 2023-05-14 NOTE — Progress Notes (Signed)
0900: Patient's blue HD port is not pulling well but is flushing. Red port is flushing and pulling well. Attempted to start patient on HD but machine was alarming for low arterial pressure. Instilled cathflo for an hour before initiating HD. HD started at 1006.

## 2023-05-14 NOTE — Progress Notes (Signed)
Progress Note   Patient: Alexandra Foster WGN:[id number] DOB: 1950-12-03 DOA: 05/13/2023     1 DOS: the patient was seen and examined on 05/14/2023   Brief hospital course: 73 year old female with end-stage renal disease on hemodialysis, osteoarthritis, asthma, COPD, type 2 diabetes mellitus, GERD, hypertension and paroxysmal atrial fibrillation and obstructive sleep apnea on CPAP presented to the ER with confusion over the last 3 days with intermittent vomiting.  She was admitted with clinical sepsis secondary to right upper lobe pneumonia likely aspiration and urinary infection.  Started on antibiotic.  1/29.  Patient has some cough and shortness of breath.  Does not wear oxygen at home.  Blood pressure very high in the right arm and very low in the left arm.  CT angio of the left arm does not show any blockages.  May have a steal from the right arm with a catheter in the right chest.  Feeding tube confirmed then placed last night and can restart tube feeds.  Had a fever of 103. 1/30.  Patient asking for water.  Patient gets all of her nutrition through her tube.   Assessment and Plan: * Sepsis due to gram-negative UTI (HCC) Sepsis present on admission with leukocytosis, fever, tachycardia and tachypnea.  Patient has right upper lobe pneumonia and Klebsiella growing out of the urine culture.  Had fever of 103 last night and white count went up to 27.3 today.  On Rocephin for urinary infection and Rocephin and Zithromax for pneumonia.   Hypotension On midodrine and florinef.  Blood pressure very low in the left arm and very high in the right arm.  Aspiration pneumonia (HCC) Right upper lobar pneumonia.  Suspect aspiration.  Continue Rocephin and Zithromax.  Acute metabolic encephalopathy Mental status improved.  ESRD on hemodialysis [facility]) Dialysis today  Dyslipidemia On Lipitor  Itching On atarax standing dose  Obesity (BMI 30-39.9) BMI 30.80  Anxiety and depression On  Klonopin and Lexapro.        Subjective: Patient asking for water and was very upset when I did not give it to her.  Patient gets all her nutrition through her tube.  Physical Exam: Vitals:   05/14/23 1230 05/14/23 1245 05/14/23 1300 05/14/23 1330  BP:  (!) 85/51 (!) 74/63 (!) 80/43  Pulse:      Resp: 15 20 (!) 21 (!) 21  Temp:      TempSrc:      SpO2: 100% 100% 99% 99%  Weight:      Height:       Physical Exam HENT:     Head: Normocephalic.  Eyes:     General: Lids are normal.     Conjunctiva/sclera: Conjunctivae normal.  Cardiovascular:     Rate and Rhythm: Normal rate and regular rhythm.     Heart sounds: Normal heart sounds, S1 normal and S2 normal.  Pulmonary:     Breath sounds: No decreased breath sounds, wheezing, rhonchi or rales.  Abdominal:     Palpations: Abdomen is soft.     Tenderness: There is no abdominal tenderness.  Musculoskeletal:     Right lower leg: No swelling.     Left Lower Extremity: Left leg is amputated above knee.  Skin:    General: Skin is warm.     Findings: No rash.  Neurological:     Mental Status: She is alert.     Data Reviewed: Urine culture growing Klebsiella, blood cultures negative for 1-day, potassium 5.2, creatinine 5.46, white blood cell count  27.3, hemoglobin 13.2, platelet count 432  Family Communication: Updated daughter on the phone  Disposition: Status is: Inpatient Remains inpatient appropriate because: Had a fever of 103 yesterday  Planned Discharge Destination: Home with home health    Time spent: 28 minutes  Author: Alford Highland, MD 05/14/2023 1:40 PM  For on call review [email or web address].

## 2023-05-14 NOTE — ED Notes (Signed)
Cooling blanket removed due to temp being 98.5 rectal.

## 2023-05-14 NOTE — Progress Notes (Addendum)
Hemodialysis note  Received patient in bed to unit. Alert and oriented x 3.  Informed consent signed and in chart.  Treatment initiated: 1006 Treatment completed: 1400  Patient tolerated well. Transported back to room, alert without acute distress.  Report given to patient's RN.   Access used: Right Chest HD catheter Access issues: Blue port is not pulling well.  Total UF removed: 0 (due to episodes of hypotension) Medication(s) given:  Albumin 25g IV  Post HD weight: 81.4 kg   Alexandra Foster [name]

## 2023-05-14 NOTE — Progress Notes (Signed)
[facility]  ROUNDING NOTE   Subjective:   Alexandra Foster is a 73 y.o. female with history of hypertension, coronary artery disease, congestive heart failure, COPD, cirrhosis, diabetes, status post left AKA, status post G-tube placement, and end stage renal disease on hemodialysis.  Patient presents to ED from dialysis at the request of family due to altered mental status.  Patient has been admitted for Lower urinary tract infectious disease [N39.0] Altered mental status, unspecified altered mental status type [R41.82] Pneumonia due to infectious organism, unspecified laterality, unspecified part of lung [J18.9] Sepsis due to gram-negative UTI (HCC) [A41.50, N39.0]  Patient is known to our practice from previous admissions and receives outpatient dialysis treatments at [facility] on a TTS schedule, supervised by [facility].    Patient seen and evaluated during dialysis   HEMODIALYSIS FLOWSHEET:  Blood Flow Rate (mL/min): 349 mL/min Arterial Pressure (mmHg): -212.71 mmHg Venous Pressure (mmHg): 203.22 mmHg TMP (mmHg): -19.59 mmHg Ultrafiltration Rate (mL/min): 1023 mL/min Dialysate Flow Rate (mL/min): 299 ml/min  Resting comfortably during treatment.    Objective:  Vital signs in last 24 hours:  Temp:  [98.4 F (36.9 C)-103.8 F (39.9 C)] 98.4 F (36.9 C) (01/30 0836) Pulse Rate:  [81-113] 93 (01/30 1100) Resp:  [13-35] 16 (01/30 1100) BP: (66-110)/(42-91) 92/75 (01/30 1100) SpO2:  [91 %-100 %] 100 % (01/30 1100) Weight:  [81.4 kg] 81.4 kg (01/30 0835)  Weight change:  Filed Weights   05/12/23 1634 05/14/23 0835  Weight: 79.4 kg 81.4 kg    Intake/Output: I/O last 3 completed shifts: In: 1930.4 [I.V.:1000; NG/GT:3; IV Piggyback:927.4] Out: -    Intake/Output this shift:  No intake/output data recorded.  Physical Exam: General: NAD  Head: Normocephalic, atraumatic. Moist oral mucosal membranes  Eyes: Anicteric  Lungs:  Clear to  auscultation, normal effort  Heart: Regular rate and rhythm  Abdomen:  Soft, nontender, obese  Extremities:  trace peripheral edema.  Neurologic: Somnolent, moving all four extremities  Skin: No lesions  Access: Rt chest permcath     Basic Metabolic Panel: Recent Labs  Lab 05/12/23 2345 05/13/23 0522 05/14/23 0847  NA 136 135 134*  K 4.7 4.1 5.2*  CL 94* 103 101  CO2 21* 20* 18*  GLUCOSE 142* 123* 137*  BUN 26* 30* 51*  CREATININE 3.61* 3.95* 5.64*  CALCIUM 9.5 8.6* 8.9  PHOS  --   --  4.0    Liver Function Tests: Recent Labs  Lab 05/12/23 2345 05/14/23 0847  AST 81*  --   ALT 64*  --   ALKPHOS 190*  --   BILITOT 0.9  --   PROT 8.9*  --   ALBUMIN 3.2* 2.2*   No results for input(s): "LIPASE", "AMYLASE" in the last 168 hours. No results for input(s): "AMMONIA" in the last 168 hours.  CBC: Recent Labs  Lab 05/12/23 1743 05/13/23 0522 05/14/23 0847  WBC 20.8* 5.6 27.3*  HGB 14.3 13.8 13.2  HCT 45.3 46.0 42.1  MCV 82.4 86.6 82.5  PLT 353 332 432*    Cardiac Enzymes: No results for input(s): "CKTOTAL", "CKMB", "CKMBINDEX", "TROPONINI" in the last 168 hours.  BNP: Invalid input(s): "POCBNP"  CBG: No results for input(s): "GLUCAP" in the last 168 hours.  Microbiology: Results for orders placed or performed during the hospital encounter of 05/13/23  Resp panel by RT-PCR (RSV, Flu A&B, Covid) Anterior Nasal Swab     Status: None   Collection Time: 05/12/23  4:37 PM   Specimen:  Anterior Nasal Swab  Result Value Ref Range Status   SARS Coronavirus 2 by RT PCR NEGATIVE NEGATIVE Final    Comment: (NOTE) SARS-CoV-2 target nucleic acids are NOT DETECTED.  The SARS-CoV-2 RNA is generally detectable in upper respiratory specimens during the acute phase of infection. The lowest concentration of SARS-CoV-2 viral copies this assay can detect is 138 copies/mL. A negative result does not preclude SARS-Cov-2 infection and should not be used as the sole basis for  treatment or other patient management decisions. A negative result may occur with  improper specimen collection/handling, submission of specimen other than nasopharyngeal swab, presence of viral mutation(s) within the areas targeted by this assay, and inadequate number of viral copies(<138 copies/mL). A negative result must be combined with clinical observations, patient history, and epidemiological information. The expected result is Negative.  Fact Sheet for Patients:  BloggerCourse.com  Fact Sheet for Healthcare Providers:  SeriousBroker.it  This test is no t yet approved or cleared by the [place] FDA and  has been authorized for detection and/or diagnosis of SARS-CoV-2 by FDA under an Emergency Use Authorization (EUA). This EUA will remain  in effect (meaning this test can be used) for the duration of the COVID-19 declaration under Section 564(b)(1) of the Act, 21 U.S.C.section 360bbb-3(b)(1), unless the authorization is terminated  or revoked sooner.       Influenza A by PCR NEGATIVE NEGATIVE Final   Influenza B by PCR NEGATIVE NEGATIVE Final    Comment: (NOTE) The Xpert Xpress SARS-CoV-2/FLU/RSV plus assay is intended as an aid in the diagnosis of influenza from Nasopharyngeal swab specimens and should not be used as a sole basis for treatment. Nasal washings and aspirates are unacceptable for Xpert Xpress SARS-CoV-2/FLU/RSV testing.  Fact Sheet for Patients: BloggerCourse.com  Fact Sheet for Healthcare Providers: SeriousBroker.it  This test is not yet approved or cleared by the [place] FDA and has been authorized for detection and/or diagnosis of SARS-CoV-2 by FDA under an Emergency Use Authorization (EUA). This EUA will remain in effect (meaning this test can be used) for the duration of the COVID-19 declaration under Section 564(b)(1) of the Act, 21  U.S.C. section 360bbb-3(b)(1), unless the authorization is terminated or revoked.     Resp Syncytial Virus by PCR NEGATIVE NEGATIVE Final    Comment: (NOTE) Fact Sheet for Patients: BloggerCourse.com  Fact Sheet for Healthcare Providers: SeriousBroker.it  This test is not yet approved or cleared by the [place] FDA and has been authorized for detection and/or diagnosis of SARS-CoV-2 by FDA under an Emergency Use Authorization (EUA). This EUA will remain in effect (meaning this test can be used) for the duration of the COVID-19 declaration under Section 564(b)(1) of the Act, 21 U.S.C. section 360bbb-3(b)(1), unless the authorization is terminated or revoked.  Performed at [facility], [address] [place], [place] [address]   Culture, blood (routine x 2)     Status: None (Preliminary result)   Collection Time: 05/12/23 11:45 PM   Specimen: BLOOD  Result Value Ref Range Status   Specimen Description BLOOD RIGHT ANTECUBITAL  Final   Special Requests   Final    BOTTLES DRAWN AEROBIC AND ANAEROBIC Blood Culture adequate volume   Culture   Final    NO GROWTH 1 DAY Performed at [facility], [address] [place], [place] [address]    Report Status PENDING  Incomplete  Culture, blood (routine x 2)     Status: None (Preliminary result)   Collection  Time: 05/13/23 12:00 AM   Specimen: BLOOD  Result Value Ref Range Status   Specimen Description BLOOD LEFT WRIST  Final   Special Requests   Final    AEROBIC BOTTLE ONLY Blood Culture results may not be optimal due to an inadequate volume of blood received in culture bottles   Culture   Final    NO GROWTH 1 DAY Performed at [facility], [address] [place], [place] [address]    Report Status PENDING  Incomplete  Urine Culture (for pregnant, neutropenic or urologic patients or patients with an indwelling urinary catheter)     Status:  Abnormal (Preliminary result)   Collection Time: 05/13/23 12:10 AM   Specimen: Urine, Clean Catch  Result Value Ref Range Status   Specimen Description   Final    URINE, CLEAN CATCH Performed at [facility], [address], [place], [place] [address]    Special Requests   Final    NONE Performed at [facility], [address] [place], [place] [address]    Culture (A)  Final    >=100,000 COLONIES/mL GRAM NEGATIVE RODS IDENTIFICATION AND SUSCEPTIBILITIES TO FOLLOW Performed at [facility], [address], [place], [place] [address]    Report Status PENDING  Incomplete    Coagulation Studies: Recent Labs    05/13/23 0522  LABPROT 14.8  INR 1.1    Urinalysis: Recent Labs    05/13/23 0010  COLORURINE RED*  LABSPEC TEST NOT REPORTED DUE TO COLOR INTERFERENCE OF URINE PIGMENT  PHURINE TEST NOT REPORTED DUE TO COLOR INTERFERENCE OF URINE PIGMENT  GLUCOSEU TEST NOT REPORTED DUE TO COLOR INTERFERENCE OF URINE PIGMENT*  HGBUR TEST NOT REPORTED DUE TO COLOR INTERFERENCE OF URINE PIGMENT*  BILIRUBINUR TEST NOT REPORTED DUE TO COLOR INTERFERENCE OF URINE PIGMENT*  KETONESUR TEST NOT REPORTED DUE TO COLOR INTERFERENCE OF URINE PIGMENT*  PROTEINUR TEST NOT REPORTED DUE TO COLOR INTERFERENCE OF URINE PIGMENT*  NITRITE TEST NOT REPORTED DUE TO COLOR INTERFERENCE OF URINE PIGMENT*  LEUKOCYTESUR TEST NOT REPORTED DUE TO COLOR INTERFERENCE OF URINE PIGMENT*      Imaging: DG ABDOMEN PEG TUBE LOCATION Result Date: 05/13/2023 CLINICAL DATA:  G-tube check EXAM: ABDOMEN - 1 VIEW COMPARISON:  CT 05/13/2023 FINDINGS: Technologist notes indicate 30 cc Gastrografin administered through the percutaneous gastrostomy tube. There is contrast throughout the stomach. Gastrostomy tube in the distal stomach. IMPRESSION: 1. Percutaneous gastrostomy tube appears in proper location on AP view of the abdomen. 2. Injected contrast outlines the stomach. Electronically Signed   By:  Genevive Bi M.D.   On: 05/13/2023 21:42   CT ANGIO UP EXTREM LEFT W &/OR WO CONTAST Result Date: 05/13/2023 CLINICAL DATA:  Decreased blood pressure in left arm compared to the right EXAM: CT ANGIOGRAPHY OF THE LEFT UPPEREXTREMITY TECHNIQUE: Multidetector CT imaging of the left upper extremitywas performed using the standard protocol during bolus administration of intravenous contrast. Multiplanar CT image reconstructions and MIPs were obtained to evaluate the vascular anatomy. RADIATION DOSE REDUCTION: This exam was performed according to the departmental dose-optimization program which includes automated exposure control, adjustment of the mA and/or kV according to patient size and/or use of iterative reconstruction technique. CONTRAST:  OMNIPAQUE IOHEXOL 350 MG/ML SOLN COMPARISON:  None Available. FINDINGS: Vascular: Thoracic aorta shows no aneurysmal dilatation or dissection. No cardiac enlargement is seen. The origins of the right innominate, left common carotid and left subclavian arteries are all widely patent. The left subclavian artery and  left axillary artery are widely patent. Considerable artifact from the injected contrast bolus in the left arm is noted limiting the exam. The brachial artery appears patent although very obscured by the contrast bolus within the venous structures. The distal branches below the elbow are poorly visualized due to the timing of the contrast bolus. Pulmonary artery as visualized is within normal limits no pulmonary embolus is seen. The abdominal aorta is within normal limits. IVC filter is noted in satisfactory position. Dialysis catheter is noted in the right jugular vein. Nonvascular: Thoracic inlet is within normal limits. Scattered nodular changes are noted in the breasts bilaterally of uncertain significance. Correlation with prior mammography would be helpful. These were not present on prior CT in November of 2024. No hilar or mediastinal adenopathy is  noted. The lungs demonstrate some patchy atelectatic changes bilaterally. Diffuse parenchymal densities are seen particularly in the right lung. This may represent some volume overload and edema. Possibility of underlying inflammatory change could not be totally excluded. Included structures in the abdomen show evidence of gastrostomy catheter in satisfactory position. Scattered diverticular change of the colon is noted without diverticulitis. Bony structures show degenerative change of the thoracolumbar spine. Review of the MIP images confirms the above findings. IMPRESSION: No findings to suggest left subclavian or axillary arterial stenosis are seen. The more distal arterial structures to include the brachial, radial and ulnar arteries are poorly visualized in part due to injection of contrast in the same arm as well as timing of the contrast bolus. Changes in lungs particularly on the right which may represent edema although the possibility of underlying inflammatory change deserves consideration. Subcutaneous nodular changes in the breasts bilaterally not seen on prior exam in November of 2024. The need for further follow-up can be determined on a clinical basis. Electronically Signed   By: Alcide Clever M.D.   On: 05/13/2023 20:07   DG Chest 1 View Result Date: 05/12/2023 CLINICAL DATA:  Shortness of breath EXAM: CHEST  1 VIEW COMPARISON:  03/06/2023 FINDINGS: The patient is rotated to the right on today's radiograph, reducing diagnostic sensitivity and specificity. Dialysis catheter tip: Lower SVC. Hazy opacity projects over the right upper lobe. Some of this is rotational and cause by soft tissues of the chest wall and asymmetric inclusion of the scapula, but accounting for this possibility of right upper lobe bronchopneumonia or asymmetric edema is not excluded. Left lung appears clear. Thoracic spondylosis. IMPRESSION: 1. Hazy opacity projects over the right upper lobe. Some of this is rotational and  cause by soft tissues of the chest wall and asymmetric inclusion of the scapula, but accounting for this possibility of right upper lobe bronchopneumonia or asymmetric edema is not excluded. 2. Dialysis catheter tip: Lower SVC. Electronically Signed   By: Gaylyn Rong M.D.   On: 05/12/2023 17:29     Medications:    azithromycin Stopped (05/14/23 0617)   cefTRIAXone (ROCEPHIN)  IV Stopped (05/13/23 1232)   feeding supplement (NEPRO CARB STEADY) Stopped (05/14/23 0416)    ascorbic acid  250 mg Per Tube Daily   atorvastatin  20 mg Per Tube Daily   vitamin D3  2,000 Units Per Tube Daily   cholestyramine  4 g Per Tube TID   escitalopram  10 mg Per Tube Daily   famotidine  10 mg Per Tube Daily   fludrocortisone  0.1 mg Per Tube Daily   free water  30 mL Per Tube Q4H   heparin injection (subcutaneous)  5,000 Units Subcutaneous  Q8H   hydrOXYzine  50 mg Per Tube TID   ipratropium-albuterol  3 mL Nebulization QID   metoCLOPramide  5 mg Per Tube TID AC & HS   midodrine  10 mg Per Tube TID with meals   multivitamin  1 tablet Per Tube QHS   nystatin   Topical TID   acetaminophen **OR** acetaminophen, chlorpheniramine-HYDROcodone, heparin, liver oil-zinc oxide, loperamide HCl, LORazepam, magnesium hydroxide, ondansetron **OR** ondansetron (ZOFRAN) IV, traZODone  Assessment/ Plan:  Ms. Shikita Vaillancourt is a 73 y.o.  female with history of hypertension, coronary artery disease, congestive heart failure, COPD, cirrhosis, diabetes, status post left AKA, status post G-tube placement, and end stage renal disease on hemodialysis.  Patient presents to ED from dialysis at the request of family due to altered mental status.  Patient has been admitted for Lower urinary tract infectious disease [N39.0] Altered mental status, unspecified altered mental status type [R41.82] Pneumonia due to infectious organism, unspecified laterality, unspecified part of lung [J18.9] Sepsis due to gram-negative UTI  (HCC) [A41.50, N39.0]  [facility]/TTs/Rt permcath/75.6kg  End-stage renal disease: Receiving dialysis today, UF goal 1.5L. Hypotension during treatment, will order Albumin 25g once for blood pressure support and fluid removal. Next treatment scheduled for Saturday.   #2.  Sepsis, gram negative UTI. Received Lactated ringers in ED. Stopped. Primary team to manage IV Rocephin and azithromycin. Urine culture with gram negative rods.   #3: Anemia of chronic kidney disease Lab Results  Component Value Date   HGB 13.2 05/14/2023    Hgb at desired range.    #4: Secondary Hyperparathyroidism: with outpatient labs: PTH 35, phosphorus 7.6, calcium 9.4 on 04/21/23.   Lab Results  Component Value Date   CALCIUM 8.9 05/14/2023   PHOS 4.0 05/14/2023    Calcium and phosphorus remain stable    LOS: 1 Alexandra Foster 1/30/202511:16 AM

## 2023-05-15 DIAGNOSIS — I9589 Other hypotension: Secondary | ICD-10-CM | POA: Diagnosis not present

## 2023-05-15 DIAGNOSIS — J189 Pneumonia, unspecified organism: Secondary | ICD-10-CM | POA: Diagnosis not present

## 2023-05-15 DIAGNOSIS — A419 Sepsis, unspecified organism: Secondary | ICD-10-CM | POA: Diagnosis not present

## 2023-05-15 DIAGNOSIS — B9689 Other specified bacterial agents as the cause of diseases classified elsewhere: Secondary | ICD-10-CM

## 2023-05-15 DIAGNOSIS — N390 Urinary tract infection, site not specified: Secondary | ICD-10-CM | POA: Diagnosis not present

## 2023-05-15 DIAGNOSIS — E875 Hyperkalemia: Secondary | ICD-10-CM | POA: Diagnosis present

## 2023-05-15 DIAGNOSIS — R6520 Severe sepsis without septic shock: Secondary | ICD-10-CM

## 2023-05-15 DIAGNOSIS — J9601 Acute respiratory failure with hypoxia: Secondary | ICD-10-CM

## 2023-05-15 LAB — CBC WITH DIFFERENTIAL/PLATELET
Abs Immature Granulocytes: 0.51 10*3/uL — ABNORMAL HIGH (ref 0.00–0.07)
Basophils Absolute: 0.1 10*3/uL (ref 0.0–0.1)
Basophils Relative: 0 %
Eosinophils Absolute: 5.9 10*3/uL — ABNORMAL HIGH (ref 0.0–0.5)
Eosinophils Relative: 22 %
HCT: 36 % (ref 36.0–46.0)
Hemoglobin: 11.7 g/dL — ABNORMAL LOW (ref 12.0–15.0)
Immature Granulocytes: 0 %
Lymphocytes Relative: 3 %
Lymphs Abs: 0.8 10*3/uL (ref 0.7–4.0)
MCH: 26.7 pg (ref 26.0–34.0)
MCHC: 32.5 g/dL (ref 30.0–36.0)
MCV: 82.2 fL (ref 80.0–100.0)
Monocytes Absolute: 0.8 10*3/uL (ref 0.1–1.0)
Monocytes Relative: 3 %
Neutro Abs: 19.4 10*3/uL — ABNORMAL HIGH (ref 1.7–7.7)
Neutrophils Relative %: 70 %
Platelets: 270 10*3/uL (ref 150–400)
RBC: 4.38 MIL/uL (ref 3.87–5.11)
RDW: 15.7 % — ABNORMAL HIGH (ref 11.5–15.5)
Smear Review: NORMAL
WBC: 27.5 10*3/uL — ABNORMAL HIGH (ref 4.0–10.5)
nRBC: 0.2 % (ref 0.0–0.2)

## 2023-05-15 LAB — GLUCOSE, CAPILLARY
Glucose-Capillary: 126 mg/dL — ABNORMAL HIGH (ref 70–99)
Glucose-Capillary: 141 mg/dL — ABNORMAL HIGH (ref 70–99)
Glucose-Capillary: 149 mg/dL — ABNORMAL HIGH (ref 70–99)

## 2023-05-15 LAB — URINE CULTURE: Culture: >=100000 — AB

## 2023-05-15 LAB — PATHOLOGIST SMEAR REVIEW

## 2023-05-15 MED ORDER — JUVEN PO PACK
1.0000 | PACK | Freq: Two times a day (BID) | ORAL | Status: DC
  Administered 2023-05-15 – 2023-05-18 (×4): 1

## 2023-05-15 MED ORDER — SODIUM CHLORIDE 0.9 % IV SOLN
100.0000 mg | Freq: Two times a day (BID) | INTRAVENOUS | Status: DC
  Administered 2023-05-15 – 2023-05-16 (×2): 100 mg via INTRAVENOUS
  Filled 2023-05-15 (×2): qty 100

## 2023-05-15 MED ORDER — RENA-VITE PO TABS
1.0000 | ORAL_TABLET | Freq: Every day | ORAL | Status: DC
  Administered 2023-05-15 – 2023-05-17 (×3): 1
  Filled 2023-05-15 (×3): qty 1

## 2023-05-15 NOTE — Progress Notes (Signed)
[facility]  ROUNDING NOTE   Subjective:   Tawania Daponte is a 73 y.o. female with history of hypertension, coronary artery disease, congestive heart failure, COPD, cirrhosis, diabetes, status post left AKA, status post G-tube placement, and end stage renal disease on hemodialysis.  Patient presents to ED from dialysis at the request of family due to altered mental status.  Patient has been admitted for Lower urinary tract infectious disease [N39.0] Altered mental status, unspecified altered mental status type [R41.82] Pneumonia due to infectious organism, unspecified laterality, unspecified part of lung [J18.9] Sepsis due to gram-negative UTI (HCC) [A41.50, N39.0]  Patient is known to our practice from previous admissions and receives outpatient dialysis treatments at [facility] on a TTS schedule, supervised by [facility].    Patient seen resting in bed No family present Alert and oriented to self N.p.o., tube feeds at 30 mL/h    Objective:  Vital signs in last 24 hours:  Temp:  [97.7 F (36.5 C)-99.3 F (37.4 C)] 97.8 F (36.6 C) (01/31 0806) Pulse Rate:  [85-97] 87 (01/31 1120) Resp:  [16-27] 16 (01/31 0806) BP: (85-119)/(39-69) 102/53 (01/31 1120) SpO2:  [92 %-100 %] 92 % (01/31 0806) Weight:  [77.2 kg-81.4 kg] 77.2 kg (01/31 0350)  Weight change:  Filed Weights   05/14/23 0835 05/14/23 1400 05/15/23 0350  Weight: 81.4 kg 81.4 kg 77.2 kg    Intake/Output: I/O last 3 completed shifts: In: 1470.1 [NG/GT:895.5; IV Piggyback:574.6] Out: 0    Intake/Output this shift:  Total I/O In: 35.3 [NG/GT:35.3] Out: -   Physical Exam: General: NAD  Head: Normocephalic, atraumatic. Moist oral mucosal membranes  Eyes: Anicteric  Lungs:  Clear to auscultation, normal effort  Heart: Regular rate and rhythm  Abdomen:  Soft, nontender, obese  Extremities:  trace peripheral edema.  Neurologic: Alert and oriented to self, moving all four  extremities  Skin: No lesions  Access: Rt chest permcath     Basic Metabolic Panel: Recent Labs  Lab 05/12/23 2345 05/13/23 0522 05/14/23 0847  NA 136 135 134*  K 4.7 4.1 5.2*  CL 94* 103 101  CO2 21* 20* 18*  GLUCOSE 142* 123* 137*  BUN 26* 30* 51*  CREATININE 3.61* 3.95* 5.64*  CALCIUM 9.5 8.6* 8.9  PHOS  --   --  4.0    Liver Function Tests: Recent Labs  Lab 05/12/23 2345 05/14/23 0847  AST 81*  --   ALT 64*  --   ALKPHOS 190*  --   BILITOT 0.9  --   PROT 8.9*  --   ALBUMIN 3.2* 2.2*   No results for input(s): "LIPASE", "AMYLASE" in the last 168 hours. No results for input(s): "AMMONIA" in the last 168 hours.  CBC: Recent Labs  Lab 05/12/23 1743 05/13/23 0522 05/14/23 0847 05/15/23 0324  WBC 20.8* 5.6 27.3* 27.5*  NEUTROABS  --   --   --  19.4*  HGB 14.3 13.8 13.2 11.7*  HCT 45.3 46.0 42.1 36.0  MCV 82.4 86.6 82.5 82.2  PLT 353 332 432* 270    Cardiac Enzymes: No results for input(s): "CKTOTAL", "CKMB", "CKMBINDEX", "TROPONINI" in the last 168 hours.  BNP: Invalid input(s): "POCBNP"  CBG: Recent Labs  Lab 05/14/23 1555 05/14/23 1952 05/15/23 0027 05/15/23 0358  GLUCAP 125* 120* 126* 149*    Microbiology: Results for orders placed or performed during the hospital encounter of 05/13/23  Resp panel by RT-PCR (RSV, Flu A&B, Covid) Anterior Nasal Swab  Status: None   Collection Time: 05/12/23  4:37 PM   Specimen: Anterior Nasal Swab  Result Value Ref Range Status   SARS Coronavirus 2 by RT PCR NEGATIVE NEGATIVE Final    Comment: (NOTE) SARS-CoV-2 target nucleic acids are NOT DETECTED.  The SARS-CoV-2 RNA is generally detectable in upper respiratory specimens during the acute phase of infection. The lowest concentration of SARS-CoV-2 viral copies this assay can detect is 138 copies/mL. A negative result does not preclude SARS-Cov-2 infection and should not be used as the sole basis for treatment or other patient management decisions.  A negative result may occur with  improper specimen collection/handling, submission of specimen other than nasopharyngeal swab, presence of viral mutation(s) within the areas targeted by this assay, and inadequate number of viral copies(<138 copies/mL). A negative result must be combined with clinical observations, patient history, and epidemiological information. The expected result is Negative.  Fact Sheet for Patients:  BloggerCourse.com  Fact Sheet for Healthcare Providers:  SeriousBroker.it  This test is no t yet approved or cleared by the [place] FDA and  has been authorized for detection and/or diagnosis of SARS-CoV-2 by FDA under an Emergency Use Authorization (EUA). This EUA will remain  in effect (meaning this test can be used) for the duration of the COVID-19 declaration under Section 564(b)(1) of the Act, 21 U.S.C.section 360bbb-3(b)(1), unless the authorization is terminated  or revoked sooner.       Influenza A by PCR NEGATIVE NEGATIVE Final   Influenza B by PCR NEGATIVE NEGATIVE Final    Comment: (NOTE) The Xpert Xpress SARS-CoV-2/FLU/RSV plus assay is intended as an aid in the diagnosis of influenza from Nasopharyngeal swab specimens and should not be used as a sole basis for treatment. Nasal washings and aspirates are unacceptable for Xpert Xpress SARS-CoV-2/FLU/RSV testing.  Fact Sheet for Patients: BloggerCourse.com  Fact Sheet for Healthcare Providers: SeriousBroker.it  This test is not yet approved or cleared by the [place] FDA and has been authorized for detection and/or diagnosis of SARS-CoV-2 by FDA under an Emergency Use Authorization (EUA). This EUA will remain in effect (meaning this test can be used) for the duration of the COVID-19 declaration under Section 564(b)(1) of the Act, 21 U.S.C. section 360bbb-3(b)(1), unless the authorization  is terminated or revoked.     Resp Syncytial Virus by PCR NEGATIVE NEGATIVE Final    Comment: (NOTE) Fact Sheet for Patients: BloggerCourse.com  Fact Sheet for Healthcare Providers: SeriousBroker.it  This test is not yet approved or cleared by the [place] FDA and has been authorized for detection and/or diagnosis of SARS-CoV-2 by FDA under an Emergency Use Authorization (EUA). This EUA will remain in effect (meaning this test can be used) for the duration of the COVID-19 declaration under Section 564(b)(1) of the Act, 21 U.S.C. section 360bbb-3(b)(1), unless the authorization is terminated or revoked.  Performed at [facility], [address] [place], [place] [address]   Culture, blood (routine x 2)     Status: None (Preliminary result)   Collection Time: 05/12/23 11:45 PM   Specimen: BLOOD  Result Value Ref Range Status   Specimen Description BLOOD RIGHT ANTECUBITAL  Final   Special Requests   Final    BOTTLES DRAWN AEROBIC AND ANAEROBIC Blood Culture adequate volume   Culture   Final    NO GROWTH 2 DAYS Performed at [facility], [address] [place], [place] [address]    Report Status PENDING  Incomplete  Culture, blood (routine  x 2)     Status: None (Preliminary result)   Collection Time: 05/13/23 12:00 AM   Specimen: BLOOD  Result Value Ref Range Status   Specimen Description BLOOD LEFT WRIST  Final   Special Requests   Final    AEROBIC BOTTLE ONLY Blood Culture results may not be optimal due to an inadequate volume of blood received in culture bottles   Culture   Final    NO GROWTH 2 DAYS Performed at [facility], [address], [place], [place] [address]    Report Status PENDING  Incomplete  Urine Culture (for pregnant, neutropenic or urologic patients or patients with an indwelling urinary catheter)     Status: Abnormal   Collection Time: 05/13/23 12:10 AM   Specimen:  Urine, Clean Catch  Result Value Ref Range Status   Specimen Description   Final    URINE, CLEAN CATCH Performed at [facility], [address], [place], [place] [address]    Special Requests   Final    NONE Performed at [facility], [address], [place], [place] [address]    Culture >=100,000 COLONIES/mL KLEBSIELLA PNEUMONIAE (A)  Final   Report Status 05/15/2023 FINAL  Final   Organism ID, Bacteria KLEBSIELLA PNEUMONIAE (A)  Final      Susceptibility   Klebsiella pneumoniae - MIC*    AMPICILLIN RESISTANT Resistant     CEFAZOLIN <=4 SENSITIVE Sensitive     CEFEPIME <=0.12 SENSITIVE Sensitive     CEFTRIAXONE <=0.25 SENSITIVE Sensitive     CIPROFLOXACIN <=0.25 SENSITIVE Sensitive     GENTAMICIN <=1 SENSITIVE Sensitive     IMIPENEM <=0.25 SENSITIVE Sensitive     NITROFURANTOIN <=16 SENSITIVE Sensitive     TRIMETH/SULFA <=20 SENSITIVE Sensitive     AMPICILLIN/SULBACTAM 4 SENSITIVE Sensitive     PIP/TAZO <=4 SENSITIVE Sensitive ug/mL    * >=100,000 COLONIES/mL KLEBSIELLA PNEUMONIAE  MRSA Next Gen by PCR, Nasal     Status: Abnormal   Collection Time: 05/14/23  4:30 PM   Specimen: Nasal Mucosa; Nasal Swab  Result Value Ref Range Status   MRSA by PCR Next Gen DETECTED (A) NOT DETECTED Final    Comment: RESULT CALLED TO, READ BACK BY AND VERIFIED WITH: DORATHY MUHORO @1908  ON 05/14/23 SKL (NOTE) The GeneXpert MRSA Assay (FDA approved for NASAL specimens only), is one component of a comprehensive MRSA colonization surveillance program. It is not intended to diagnose MRSA infection nor to guide or monitor treatment for MRSA infections. Test performance is not FDA approved in patients less than 21 years old. Performed at [facility], [address] [place], [place] [address]     Coagulation Studies: Recent Labs    05/13/23 0522  LABPROT 14.8  INR 1.1    Urinalysis: Recent Labs    05/13/23 0010  COLORURINE RED*  LABSPEC TEST NOT  REPORTED DUE TO COLOR INTERFERENCE OF URINE PIGMENT  PHURINE TEST NOT REPORTED DUE TO COLOR INTERFERENCE OF URINE PIGMENT  GLUCOSEU TEST NOT REPORTED DUE TO COLOR INTERFERENCE OF URINE PIGMENT*  HGBUR TEST NOT REPORTED DUE TO COLOR INTERFERENCE OF URINE PIGMENT*  BILIRUBINUR TEST NOT REPORTED DUE TO COLOR INTERFERENCE OF URINE PIGMENT*  KETONESUR TEST NOT REPORTED DUE TO COLOR INTERFERENCE OF URINE PIGMENT*  PROTEINUR TEST NOT REPORTED DUE TO COLOR INTERFERENCE OF URINE PIGMENT*  NITRITE TEST NOT REPORTED DUE TO COLOR INTERFERENCE OF URINE PIGMENT*  LEUKOCYTESUR TEST NOT REPORTED DUE TO COLOR INTERFERENCE OF URINE PIGMENT*  Imaging: DG ABDOMEN PEG TUBE LOCATION Result Date: 05/13/2023 CLINICAL DATA:  G-tube check EXAM: ABDOMEN - 1 VIEW COMPARISON:  CT 05/13/2023 FINDINGS: Technologist notes indicate 30 cc Gastrografin administered through the percutaneous gastrostomy tube. There is contrast throughout the stomach. Gastrostomy tube in the distal stomach. IMPRESSION: 1. Percutaneous gastrostomy tube appears in proper location on AP view of the abdomen. 2. Injected contrast outlines the stomach. Electronically Signed   By: Genevive Bi M.D.   On: 05/13/2023 21:42   CT ANGIO UP EXTREM LEFT W &/OR WO CONTAST Result Date: 05/13/2023 CLINICAL DATA:  Decreased blood pressure in left arm compared to the right EXAM: CT ANGIOGRAPHY OF THE LEFT UPPEREXTREMITY TECHNIQUE: Multidetector CT imaging of the left upper extremitywas performed using the standard protocol during bolus administration of intravenous contrast. Multiplanar CT image reconstructions and MIPs were obtained to evaluate the vascular anatomy. RADIATION DOSE REDUCTION: This exam was performed according to the departmental dose-optimization program which includes automated exposure control, adjustment of the mA and/or kV according to patient size and/or use of iterative reconstruction technique. CONTRAST:  OMNIPAQUE IOHEXOL 350 MG/ML  SOLN COMPARISON:  None Available. FINDINGS: Vascular: Thoracic aorta shows no aneurysmal dilatation or dissection. No cardiac enlargement is seen. The origins of the right innominate, left common carotid and left subclavian arteries are all widely patent. The left subclavian artery and left axillary artery are widely patent. Considerable artifact from the injected contrast bolus in the left arm is noted limiting the exam. The brachial artery appears patent although very obscured by the contrast bolus within the venous structures. The distal branches below the elbow are poorly visualized due to the timing of the contrast bolus. Pulmonary artery as visualized is within normal limits no pulmonary embolus is seen. The abdominal aorta is within normal limits. IVC filter is noted in satisfactory position. Dialysis catheter is noted in the right jugular vein. Nonvascular: Thoracic inlet is within normal limits. Scattered nodular changes are noted in the breasts bilaterally of uncertain significance. Correlation with prior mammography would be helpful. These were not present on prior CT in November of 2024. No hilar or mediastinal adenopathy is noted. The lungs demonstrate some patchy atelectatic changes bilaterally. Diffuse parenchymal densities are seen particularly in the right lung. This may represent some volume overload and edema. Possibility of underlying inflammatory change could not be totally excluded. Included structures in the abdomen show evidence of gastrostomy catheter in satisfactory position. Scattered diverticular change of the colon is noted without diverticulitis. Bony structures show degenerative change of the thoracolumbar spine. Review of the MIP images confirms the above findings. IMPRESSION: No findings to suggest left subclavian or axillary arterial stenosis are seen. The more distal arterial structures to include the brachial, radial and ulnar arteries are poorly visualized in part due to  injection of contrast in the same arm as well as timing of the contrast bolus. Changes in lungs particularly on the right which may represent edema although the possibility of underlying inflammatory change deserves consideration. Subcutaneous nodular changes in the breasts bilaterally not seen on prior exam in November of 2024. The need for further follow-up can be determined on a clinical basis. Electronically Signed   By: Alcide Clever M.D.   On: 05/13/2023 20:07     Medications:    azithromycin Stopped (05/15/23 0500)   cefTRIAXone (ROCEPHIN)  IV 2 g (05/15/23 0843)   feeding supplement (NEPRO CARB STEADY) 45 mL/hr at 05/15/23 0732    ascorbic acid  250 mg Oral  Daily   atorvastatin  20 mg Oral Daily   vitamin D3  2,000 Units Oral Daily   cholestyramine  4 g Oral TID   escitalopram  10 mg Oral Daily   famotidine  10 mg Oral Daily   fludrocortisone  0.1 mg Oral Daily   free water  30 mL Per Tube Q4H   heparin injection (subcutaneous)  5,000 Units Subcutaneous Q8H   hydrOXYzine  50 mg Oral TID   metoCLOPramide  5 mg Oral TID AC & HS   midodrine  10 mg Oral TID with meals   multivitamin  1 tablet Per Tube QHS   mupirocin ointment  1 Application Nasal BID   nutrition supplement (JUVEN)  1 packet Per Tube BID BM   nystatin   Topical TID   acetaminophen **OR** acetaminophen, chlorpheniramine-HYDROcodone, diphenhydrAMINE, liver oil-zinc oxide, loperamide HCl, LORazepam, magnesium hydroxide, ondansetron **OR** ondansetron (ZOFRAN) IV, traZODone  Assessment/ Plan:  Ms. Makyra Corprew is a 73 y.o.  female with history of hypertension, coronary artery disease, congestive heart failure, COPD, cirrhosis, diabetes, status post left AKA, status post G-tube placement, and end stage renal disease on hemodialysis.  Patient presents to ED from dialysis at the request of family due to altered mental status.  Patient has been admitted for Lower urinary tract infectious disease [N39.0] Altered mental  status, unspecified altered mental status type [R41.82] Pneumonia due to infectious organism, unspecified laterality, unspecified part of lung [J18.9] Sepsis due to gram-negative UTI (HCC) [A41.50, N39.0]  [facility]/TTs/Rt permcath/75.6kg  End-stage renal disease: Patient refused dialysis yesterday due to nausea. Next treatment scheduled for Saturday.   #2.  Sepsis, gram negative UTI. Received Lactated ringers in ED. Stopped. Primary team to manage IV Rocephin and azithromycin. Urine culture with Klebsiella.   #3: Anemia of chronic kidney disease Lab Results  Component Value Date   HGB 11.7 (L) 05/15/2023    Hgb within acceptable target.  No need for ESA's at this time.   #4: Secondary Hyperparathyroidism: with outpatient labs: PTH 35, phosphorus 7.6, calcium 9.4 on 04/21/23.   Lab Results  Component Value Date   CALCIUM 8.9 05/14/2023   PHOS 4.0 05/14/2023    Will continue to monitor bone minerals as they are acceptable at this time.    LOS: 2 Cormick Moss 1/31/20251:33 PM

## 2023-05-15 NOTE — Assessment & Plan Note (Signed)
Buttocks, see full description below, documented on 1/30.

## 2023-05-15 NOTE — Assessment & Plan Note (Addendum)
Present on admission with leukocytosis fever tachycardia and tachypnea and acute respiratory failure.  Patient has right upper lobe pneumonia and Klebsiella growing out of urine culture.  WBC climbed to peak 30.1 (note pt was also on steroids). Antibiotics switched on 2/1 to Zosyn.  Monitored off after 10 days completed (2/6 >> 2/7).  Off steroids since 2/3. WBC increased when tried off antibiotics. ID consulted for recommendations Resumed on Linezolid 2/8 >> 2/11 Repeat blood cultures negative x 4days. ??Skin source - wounds appear healthy   No diarrhea. Monitor fever curve, CBC and clinically for s/sx's of infection --STOP antibiotics --Monitor CBC with diff --Hematology consulted for their input which is appreciated. --Weekly CBC's ordered for [facility] RN to draw  Suspect drug reaction, given eosinophilia and skin peeling noted after vancomycin.

## 2023-05-15 NOTE — Progress Notes (Signed)
Progress Note   Patient: Alexandra Foster WUJ:[id number] DOB: June 07, 1950 DOA: 05/13/2023     2 DOS: the patient was seen and examined on 05/15/2023   Brief hospital course: 73 year old female with end-stage renal disease on hemodialysis, osteoarthritis, asthma, COPD, type 2 diabetes mellitus, GERD, hypertension and paroxysmal atrial fibrillation and obstructive sleep apnea on CPAP presented to the ER with confusion over the last 3 days with intermittent vomiting.  She was admitted with clinical sepsis secondary to right upper lobe pneumonia likely aspiration and urinary infection.  Started on antibiotic.  1/29.  Patient has some cough and shortness of breath.  Does not wear oxygen at home.  Blood pressure very high in the right arm and very low in the left arm.  CT angio of the left arm does not show any blockages.  May have a steal from the right arm with a catheter in the right chest.  Feeding tube confirmed then placed last night and can restart tube feeds.  Had a fever of 103. 1/30.  Patient asking for water.  Patient gets all of her nutrition through her tube. 1/31.  Patient complains of dry mouth.   Assessment and Plan: Severe sepsis (HCC) Present on admission with leukocytosis fever tachycardia and tachypnea and acute respiratory failure.  Patient has right upper lobe pneumonia and Klebsiella growing out of urine culture.  White count still very elevated.  With MRSA PCR being positive we will switch Zithromax over to doxycycline.  Continue Rocephin.  Hypotension On midodrine and florinef for chronically low blood pressure.  Aspiration pneumonia (HCC) Right upper lobar pneumonia.  Suspect aspiration.  Continue Rocephin and Zithromax switch over to doxycycline with MRSA PCR being positive.  Acute metabolic encephalopathy Mental status improved.  ESRD on hemodialysis [facility]) Dialysis yesterday  Dyslipidemia On Lipitor  Hyperkalemia Dialysis to manage  Urinary tract infection  due to Klebsiella species Continue Rocephin  Itching On atarax standing dose and as needed Benadryl  Stage II decubitus ulcer (HCC) Buttocks, see full description below, documented on 1/30.  Obesity (BMI 30-39.9) BMI down to 29.21  Anxiety and depression On Klonopin and Lexapro.  Acute hypoxic respiratory failure (HCC) Patient did have a pulse ox of 86% and was placed on oxygen.  This problem has resolved and patient at 1 room air.        Subjective: Patient complains of dry mouth.  White blood cell count still elevated.  Admitted with sepsis.  Physical Exam: Vitals:   05/15/23 1118 05/15/23 1120 05/15/23 1541 05/15/23 1545  BP: (!) 85/39 (!) 102/53 (!) 78/54 110/60  Pulse: 85 87 90   Resp:   16   Temp:   99.1 F (37.3 C)   TempSrc:      SpO2:   94%   Weight:      Height:       Physical Exam HENT:     Head: Normocephalic.  Eyes:     General: Lids are normal.     Conjunctiva/sclera: Conjunctivae normal.  Cardiovascular:     Rate and Rhythm: Normal rate and regular rhythm.     Heart sounds: Normal heart sounds, S1 normal and S2 normal.  Pulmonary:     Breath sounds: Examination of the right-lower field reveals decreased breath sounds. Examination of the left-lower field reveals decreased breath sounds. Decreased breath sounds present. No wheezing, rhonchi or rales.  Abdominal:     Palpations: Abdomen is soft.     Tenderness: There is no abdominal tenderness.  Musculoskeletal:     Right lower leg: No swelling.     Left Lower Extremity: Left leg is amputated above knee.  Skin:    General: Skin is warm.     Findings: No rash.  Neurological:     Mental Status: She is alert.     Data Reviewed: Klebsiella growing out of the urine, blood cultures negative, MRSA PCR positive for MRSA White blood cell count 27.5, hemoglobin 11.7, platelet count 270 Family Communication: Updated patient's daughter on the phone  Disposition: Status is: Inpatient Remains  inpatient appropriate because: With MRSA screen being positive will switch Zithromax over to doxycycline.  Would like to see white blood cell count come down further before any disposition.  Planned Discharge Destination: Home with Home Health    Time spent: 28 minutes  Author: Alford Highland, MD 05/15/2023 5:34 PM  For on call review [email or web address].

## 2023-05-15 NOTE — Assessment & Plan Note (Signed)
Treated with antibiotics °

## 2023-05-15 NOTE — Assessment & Plan Note (Addendum)
With respiratory distress on 2/1 was placed on BiPAP.  Patient is on room air.  This problem has resolved.

## 2023-05-15 NOTE — Plan of Care (Signed)

## 2023-05-15 NOTE — Progress Notes (Addendum)
Initial Nutrition Assessment  DOCUMENTATION CODES:   Not applicable  INTERVENTION:   -Continue TF via g-tube:    Nepro @ 45 ml/hr    60 ml Prosource TF daily   30 ml free water flush every 4 hours   Tube feeding regimen provides 2024 kcal (100% of needs), 107 grams of protein, and 785 ml of H2O. Total free watrer: 965 ml daily   -Continue renal MVI daily via tube -1 packet Juven BID via tube, each packet provides 95 calories, 2.5 grams of protein (collagen), and 9.8 grams of carbohydrate (3 grams sugar); also contains 7 grams of L-arginine and L-glutamine, 300 mg vitamin C, 15 mg vitamin E, 1.2 mcg vitamin B-12, 9.5 mg zinc, 200 mg calcium, and 1.5 g  Calcium Beta-hydroxy-Beta-methylbutyrate to support wound healing   NUTRITION DIAGNOSIS:   Inadequate oral intake related to dysphagia, inability to eat as evidenced by NPO status.  GOAL:   Patient will meet greater than or equal to 90% of their needs  MONITOR:   TF tolerance  REASON FOR ASSESSMENT:   Consult Enteral/tube feeding initiation and management  ASSESSMENT:   Pt with end-stage renal disease on hemodialysis, osteoarthritis, asthma, COPD, type 2 diabetes mellitus, GERD, hypertension and paroxysmal atrial fibrillation and obstructive sleep apnea on CPAP presented with confusion with intermittent vomiting 3 days PTA.  Pt admitted with sepsis secondary to gram negative UTI.   1/29- KUB reveals g-tube positioned in stomach  Reviewed I/O's: +1.5 L x 24 hours and +3.4 L since admission  Pt lying in bed, sleeping at time of visit, but opened eyes to touch. No family present at time of visit. Pt did not interact much with this RD, and asked "what do you want" when attempted to engage pt in conversation. Pt denies any nausea, vomiting, or abdominal pain and is tolerating TF well. She confirms that she receives the same regimen at home and tolerates this well.   Pt reports that she had HD yesterday and usually tolerates  treatments well without difficult.   Pt NPO due to dysphagia and aspiration pneumonia; she receives TF via g-tube as sole source nutrition. Home regimen is Nepro @ 45 ml/hr, 60 ml Prosource TF daily, and 30 ml free water flush every 4 hours. Tube feeding regimen provides 2024 kcal (100% of needs), 107 grams of protein, and 785 ml of H2O. Total free watrer: 965 ml daily   Reviewed wt hx; pt has experienced a 10.3% wt loss over the past 3 months, which is significant for time frame. Some wt loss may be related to fluid changes due to HD. Pt also with mild edema, which may be masking true weight loss as well as fat and muscle depletions. Suspect some degree of malnutrition, however, unable to identify at this time.   Medications reviewed and include vitamin C, vitamin D3, questran, reglan, and renal MVI.   Lab Results  Component Value Date   HGBA1C 4.7 02/11/2023   PTA DM medications are none.   Labs reviewed: Na: 134, K: 5.2, CBGS: 102-149 (inpatient orders for glycemic control are none).    NUTRITION - FOCUSED PHYSICAL EXAM:  Flowsheet Row Most Recent Value  Orbital Region No depletion  Upper Arm Region Moderate depletion  Thoracic and Lumbar Region No depletion  Buccal Region No depletion  Temple Region Mild depletion  Clavicle Bone Region No depletion  Clavicle and Acromion Bone Region No depletion  Scapular Bone Region No depletion  Dorsal Hand Mild depletion  Patellar  Region Moderate depletion  Anterior Thigh Region Moderate depletion  Posterior Calf Region Moderate depletion  Edema (RD Assessment) Mild  Hair Reviewed  Eyes Reviewed  Mouth Reviewed  Skin Reviewed  Nails Reviewed       Diet Order:   Diet Order     None       EDUCATION NEEDS:   No education needs have been identified at this time  Skin:  Skin Assessment: Skin Integrity Issues: Skin Integrity Issues:: Other (Comment), Stage II Stage II: rt and lt buttocks Other: MASD under bilateral breasts, IAD  to bilateral groin  Last BM:  05/14/23 (type 5)  Height:   Ht Readings from Last 1 Encounters:  05/12/23 5\' 4"  (1.626 m)    Weight:   Wt Readings from Last 1 Encounters:  05/15/23 77.2 kg    Ideal Body Weight:  54.5 kg  BMI:  Body mass index is 29.21 kg/m.  Estimated Nutritional Needs:   Kcal:  1650-1850  Protein:  85-100 grams  Fluid:  1000 ml + UOP    Levada Schilling, RD, LDN, CDCES Registered Dietitian III Certified Diabetes Care and Education Specialist If unable to reach this RD, please use "RD Inpatient" group chat on secure chat between hours of 8am-4 pm daily

## 2023-05-16 ENCOUNTER — Other Ambulatory Visit: Payer: Self-pay | Admitting: Family Medicine

## 2023-05-16 ENCOUNTER — Inpatient Hospital Stay: Payer: Medicare Other

## 2023-05-16 DIAGNOSIS — I9589 Other hypotension: Secondary | ICD-10-CM | POA: Diagnosis not present

## 2023-05-16 DIAGNOSIS — I5033 Acute on chronic diastolic (congestive) heart failure: Secondary | ICD-10-CM | POA: Diagnosis present

## 2023-05-16 DIAGNOSIS — Z931 Gastrostomy status: Secondary | ICD-10-CM

## 2023-05-16 DIAGNOSIS — A419 Sepsis, unspecified organism: Secondary | ICD-10-CM | POA: Diagnosis not present

## 2023-05-16 DIAGNOSIS — J9601 Acute respiratory failure with hypoxia: Secondary | ICD-10-CM | POA: Diagnosis not present

## 2023-05-16 DIAGNOSIS — E875 Hyperkalemia: Secondary | ICD-10-CM

## 2023-05-16 LAB — CBC WITH DIFFERENTIAL/PLATELET
Abs Immature Granulocytes: 0.33 10*3/uL — ABNORMAL HIGH (ref 0.00–0.07)
Basophils Absolute: 0.1 10*3/uL (ref 0.0–0.1)
Basophils Relative: 0 %
Eosinophils Absolute: 9.4 10*3/uL — ABNORMAL HIGH (ref 0.0–0.5)
Eosinophils Relative: 32 %
HCT: 35 % — ABNORMAL LOW (ref 36.0–46.0)
Hemoglobin: 10.9 g/dL — ABNORMAL LOW (ref 12.0–15.0)
Immature Granulocytes: 1 %
Lymphocytes Relative: 3 %
Lymphs Abs: 0.9 10*3/uL (ref 0.7–4.0)
MCH: 26 pg (ref 26.0–34.0)
MCHC: 31.1 g/dL (ref 30.0–36.0)
MCV: 83.3 fL (ref 80.0–100.0)
Monocytes Absolute: 1.2 10*3/uL — ABNORMAL HIGH (ref 0.1–1.0)
Monocytes Relative: 4 %
Neutro Abs: 17.2 10*3/uL — ABNORMAL HIGH (ref 1.7–7.7)
Neutrophils Relative %: 60 %
Platelets: 296 10*3/uL (ref 150–400)
RBC: 4.2 MIL/uL (ref 3.87–5.11)
RDW: 15.6 % — ABNORMAL HIGH (ref 11.5–15.5)
Smear Review: NORMAL
WBC: 29.1 10*3/uL — ABNORMAL HIGH (ref 4.0–10.5)
nRBC: 0.3 % — ABNORMAL HIGH (ref 0.0–0.2)

## 2023-05-16 LAB — BASIC METABOLIC PANEL
Anion gap: 13 (ref 5–15)
BUN: 48 mg/dL — ABNORMAL HIGH (ref 8–23)
CO2: 23 mmol/L (ref 22–32)
Calcium: 9.2 mg/dL (ref 8.9–10.3)
Chloride: 101 mmol/L (ref 98–111)
Creatinine, Ser: 4.7 mg/dL — ABNORMAL HIGH (ref 0.44–1.00)
GFR, Estimated: 9 mL/min — ABNORMAL LOW (ref >60)
Glucose, Bld: 126 mg/dL — ABNORMAL HIGH (ref 70–99)
Potassium: 3.4 mmol/L — ABNORMAL LOW (ref 3.5–5.1)
Sodium: 137 mmol/L (ref 135–145)

## 2023-05-16 LAB — BLOOD GAS, ARTERIAL
Acid-base deficit: 2.3 mmol/L — ABNORMAL HIGH (ref 0.0–2.0)
Bicarbonate: 22.5 mmol/L (ref 20.0–28.0)
O2 Saturation: 98.9 %
Patient temperature: 37
pCO2 arterial: 38 mm[Hg] (ref 32–48)
pH, Arterial: 7.38 (ref 7.35–7.45)
pO2, Arterial: 90 mm[Hg] (ref 83–108)

## 2023-05-16 LAB — GLUCOSE, CAPILLARY
Glucose-Capillary: 110 mg/dL — ABNORMAL HIGH (ref 70–99)
Glucose-Capillary: 133 mg/dL — ABNORMAL HIGH (ref 70–99)

## 2023-05-16 MED ORDER — ALBUMIN HUMAN 25 % IV SOLN
INTRAVENOUS | Status: AC
  Filled 2023-05-16: qty 50

## 2023-05-16 MED ORDER — VANCOMYCIN HCL 1750 MG/350ML IV SOLN
1750.0000 mg | Freq: Once | INTRAVENOUS | Status: AC
Start: 2023-05-16 — End: 2023-05-16
  Administered 2023-05-16: 1750 mg via INTRAVENOUS
  Filled 2023-05-16: qty 350

## 2023-05-16 MED ORDER — ALBUMIN HUMAN 25 % IV SOLN
25.0000 g | Freq: Once | INTRAVENOUS | Status: AC
  Administered 2023-05-16: 25 g via INTRAVENOUS

## 2023-05-16 MED ORDER — METHYLPREDNISOLONE SODIUM SUCC 40 MG IJ SOLR
40.0000 mg | Freq: Every day | INTRAMUSCULAR | Status: DC
Start: 2023-05-16 — End: 2023-05-18
  Administered 2023-05-17 – 2023-05-18 (×2): 40 mg via INTRAVENOUS
  Filled 2023-05-16 (×3): qty 1

## 2023-05-16 MED ORDER — IOHEXOL 350 MG/ML SOLN
75.0000 mL | Freq: Once | INTRAVENOUS | Status: AC | PRN
  Administered 2023-05-16: 75 mL via INTRAVENOUS

## 2023-05-16 MED ORDER — VANCOMYCIN HCL 750 MG/150ML IV SOLN
750.0000 mg | INTRAVENOUS | Status: DC
  Filled 2023-05-16: qty 150

## 2023-05-16 MED ORDER — IPRATROPIUM-ALBUTEROL 0.5-2.5 (3) MG/3ML IN SOLN
3.0000 mL | Freq: Four times a day (QID) | RESPIRATORY_TRACT | Status: DC
  Filled 2023-05-16: qty 3

## 2023-05-16 MED ORDER — HEPARIN SODIUM (PORCINE) 1000 UNIT/ML IJ SOLN
1000.0000 [IU] | Freq: Once | INTRAMUSCULAR | Status: AC
  Administered 2023-05-16: 1000 [IU]

## 2023-05-16 MED ORDER — PIPERACILLIN-TAZOBACTAM IN DEX 2-0.25 GM/50ML IV SOLN
2.2500 g | Freq: Three times a day (TID) | INTRAVENOUS | Status: DC
  Administered 2023-05-16 – 2023-05-21 (×15): 2.25 g via INTRAVENOUS
  Filled 2023-05-16 (×17): qty 50

## 2023-05-16 MED ORDER — IPRATROPIUM-ALBUTEROL 0.5-2.5 (3) MG/3ML IN SOLN: 3.0000 mL | RESPIRATORY_TRACT | Status: DC | PRN

## 2023-05-16 NOTE — Progress Notes (Signed)
Late entry for 0851 Called a Rapid d/t patient became unresponsive after stated "I am using the bathroom" o2 Sats dropped placed patient on 3 LPM of oxygen, lowered head in trendelenburg, administered 200 ml NS until help arrived.

## 2023-05-16 NOTE — Progress Notes (Signed)
Progress Note   Patient: Alexandra Foster WUX:[id number] DOB: [birth year] DOA: 05/13/2023     3 DOS: the patient was seen and examined on 05/16/2023   Brief hospital course: 73 year old female with end-stage renal disease on hemodialysis, osteoarthritis, asthma, COPD, type 2 diabetes mellitus, GERD, hypertension and paroxysmal atrial fibrillation and obstructive sleep apnea on CPAP presented to the ER with confusion over the last 3 days with intermittent vomiting.  She was admitted with clinical sepsis secondary to right upper lobe pneumonia likely aspiration and urinary infection.  Started on antibiotic.  1/29.  Patient has some cough and shortness of breath.  Does not wear oxygen at home.  Blood pressure very high in the right arm and very low in the left arm.  CT angio of the left arm does not show any blockages.  May have a steal from the right arm with a catheter in the right chest.  Feeding tube confirmed then placed last night and can restart tube feeds.  Had a fever of 103. 1/30.  Patient asking for water.  Patient gets all of her nutrition through her tube. 1/31.  Patient complains of dry mouth. 2/1.  Called to rapid response at dialysis.  Patient had large brown BM then was less responsive and has shortness of breath.  Bipap ordered.  ABG.  Iv albumin.  Spoke with nephrology to remove fluid.   Assessment and Plan: * Acute on chronic diastolic CHF (congestive heart failure) (HCC) Spoke with nephrology to remove fluid.  Will give IV albumin.  Continue Florinef and midodrine for blood pressure.   BiPAP for right now.  ABGs, chest x-ray  Acute hypoxic respiratory failure (HCC) Will place on BiPAP for now for respiratory distress.  Was off oxygen yesterday.  Severe sepsis (HCC) Present on admission with leukocytosis fever tachycardia and tachypnea and acute respiratory failure.  Patient has right upper lobe pneumonia and Klebsiella growing out of urine culture.  White count still very  elevated.  With MRSA PCR being positive will give vancomycin.  Continue Rocephin.  Hypotension On midodrine and florinef for chronically low blood pressure.  Will give albumin with dialysis today  Aspiration pneumonia (HCC) Right upper lobar pneumonia.  Suspect aspiration.  Continue Rocephin and vancomycin.  Acute metabolic encephalopathy Mental status improved.  ESRD on hemodialysis [facility]) Dialysis today  Dyslipidemia On Lipitor  Hyperkalemia Dialysis to manage  Urinary tract infection due to Klebsiella species Continue Rocephin  Itching On atarax standing dose and as needed Benadryl  Stage II decubitus ulcer (HCC) Buttocks, see full description below, documented on 1/30.  Obesity (BMI 30-39.9) BMI down to 29.21  Anxiety and depression On Klonopin and Lexapro.        Subjective: Called to rapid response for patient having a large bowel movement and becoming a little less responsive likely vasovagal.  Then patient had shortness of breath.    Physical Exam: Vitals:   05/16/23 0900 05/16/23 0911 05/16/23 0914 05/16/23 0915  BP: (!) 79/54  (!) 81/58 (!) 84/56  Pulse:  92    Resp: (!) 37 (!) 30 (!) 27 (!) 27  Temp:      TempSrc:      SpO2:  95% 100% 100%  Weight:      Height:       Physical Exam HENT:     Head: Normocephalic.  Eyes:     General: Lids are normal.     Conjunctiva/sclera: Conjunctivae normal.  Cardiovascular:     Rate and Rhythm:  Normal rate and regular rhythm.     Heart sounds: Normal heart sounds, S1 normal and S2 normal.  Pulmonary:     Effort: Accessory muscle usage present.     Breath sounds: Examination of the right-middle field reveals wheezing. Examination of the left-middle field reveals wheezing. Examination of the right-lower field reveals decreased breath sounds and rales. Examination of the left-lower field reveals decreased breath sounds and rales. Decreased breath sounds, wheezing and rales present. No rhonchi.  Abdominal:      Palpations: Abdomen is soft.     Tenderness: There is no abdominal tenderness.  Musculoskeletal:     Right lower leg: No swelling.     Left Lower Extremity: Left leg is amputated above knee.  Skin:    General: Skin is warm.     Findings: No rash.  Neurological:     Mental Status: She is alert.     Data Reviewed: EKG interpreted by me shows normal sinus rhythm 89 bpm right bundle branch block. ABG shows a pH of 7.38 pCO2 of 38 and pO2 of 98 on nasal cannula Creatinine 4.7, white blood cell count 29.1, hemoglobin 10.9, platelet count 296  Family Communication: Updated daughter on the phone  Disposition: Status is: Inpatient Remains inpatient appropriate because: With respiratory distress currently will continue to monitor here in the hospital.  Planned Discharge Destination: To be determined    Time spent: 38 minutes, critical care time.  Patient placed on BiPAP.  Case discussed with nephrology to remove fluid.  IV albumin ABG chest x-ray ordered.  Patient is a DNR.  Author: Alford Highland, MD 05/16/2023 9:27 AM  For on call review [email or web address].

## 2023-05-16 NOTE — Progress Notes (Signed)
[facility]  Dialysis Note   Subjective:   Seen and examined on hemodialysis treatment.  Events from this a.m. noted.  Blood pressure is low.  Receiving albumin IV.   HEMODIALYSIS FLOWSHEET:  Blood Flow Rate (mL/min): 199 mL/min Arterial Pressure (mmHg): -128.68 mmHg Venous Pressure (mmHg): 126.25 mmHg TMP (mmHg): 4.24 mmHg Ultrafiltration Rate (mL/min): 764 mL/min Dialysate Flow Rate (mL/min): 300 ml/min Dialysis Fluid Bolus: Normal Saline    Objective:  Vital signs in last 24 hours:  Temp:  [98.5 F (36.9 C)-99.3 F (37.4 C)] 99.3 F (37.4 C) (02/01 0811) Pulse Rate:  [80-92] 92 (02/01 1230) Resp:  [16-44] 23 (02/01 1230) BP: (77-110)/(47-66) 90/58 (02/01 1230) SpO2:  [90 %-100 %] 100 % (02/01 1230) FiO2 (%):  [50 %] 50 % (02/01 0911) Weight:  [76.2 kg-78.2 kg] 76.2 kg (02/01 0811)  Weight change: -3.2 kg Filed Weights   05/15/23 0350 05/16/23 0518 05/16/23 0811  Weight: 77.2 kg 78.2 kg 76.2 kg    Intake/Output: I/O last 3 completed shifts: In: 2637.9 [ZO/XW:9604; IV Piggyback:753.9] Out: -    Intake/Output this shift:  No intake/output data recorded.  Physical Exam: General: NAD,   Head: Normocephalic, atraumatic. Moist oral mucosal membranes  Eyes: Anicteric, PERRL  Neck: Supple, trachea midline  Lungs:  Clear to auscultation  Heart: Regular rate and rhythm  Abdomen:  Soft, nontender,   Extremities:  peripheral edema.  Neurologic: Nonfocal, moving all four extremities  Skin: No lesions  Access:     Basic Metabolic Panel: Recent Labs  Lab 05/12/23 2345 05/13/23 0522 05/14/23 0847 05/16/23 0414  NA 136 135 134* 137  K 4.7 4.1 5.2* 3.4*  CL 94* 103 101 101  CO2 21* 20* 18* 23  GLUCOSE 142* 123* 137* 126*  BUN 26* 30* 51* 48*  CREATININE 3.61* 3.95* 5.64* 4.70*  CALCIUM 9.5 8.6* 8.9 9.2  PHOS  --   --  4.0  --     Liver Function Tests: Recent Labs  Lab 05/12/23 2345 05/14/23 0847  AST 81*  --   ALT 64*  --   ALKPHOS  190*  --   BILITOT 0.9  --   PROT 8.9*  --   ALBUMIN 3.2* 2.2*   No results for input(s): "LIPASE", "AMYLASE" in the last 168 hours. No results for input(s): "AMMONIA" in the last 168 hours.  CBC: Recent Labs  Lab 05/12/23 1743 05/13/23 0522 05/14/23 0847 05/15/23 0324 05/16/23 0414  WBC 20.8* 5.6 27.3* 27.5* 29.1*  NEUTROABS  --   --   --  19.4* 17.2*  HGB 14.3 13.8 13.2 11.7* 10.9*  HCT 45.3 46.0 42.1 36.0 35.0*  MCV 82.4 86.6 82.5 82.2 83.3  PLT 353 332 432* 270 296    Cardiac Enzymes: No results for input(s): "CKTOTAL", "CKMB", "CKMBINDEX", "TROPONINI" in the last 168 hours.  BNP: Invalid input(s): "POCBNP"  CBG: Recent Labs  Lab 05/15/23 0027 05/15/23 0358 05/15/23 1938 05/15/23 2358 05/16/23 0443  GLUCAP 126* 149* 141* 110* 133*    Microbiology: Results for orders placed or performed during the hospital encounter of 05/13/23  Resp panel by RT-PCR (RSV, Flu A&B, Covid) Anterior Nasal Swab     Status: None   Collection Time: 05/12/23  4:37 PM   Specimen: Anterior Nasal Swab  Result Value Ref Range Status   SARS Coronavirus 2 by RT PCR NEGATIVE NEGATIVE Final    Comment: (NOTE) SARS-CoV-2 target nucleic acids are NOT DETECTED.  The SARS-CoV-2 RNA is generally detectable in  upper respiratory specimens during the acute phase of infection. The lowest concentration of SARS-CoV-2 viral copies this assay can detect is 138 copies/mL. A negative result does not preclude SARS-Cov-2 infection and should not be used as the sole basis for treatment or other patient management decisions. A negative result may occur with  improper specimen collection/handling, submission of specimen other than nasopharyngeal swab, presence of viral mutation(s) within the areas targeted by this assay, and inadequate number of viral copies(<138 copies/mL). A negative result must be combined with clinical observations, patient history, and epidemiological information. The expected  result is Negative.  Fact Sheet for Patients:  BloggerCourse.com  Fact Sheet for Healthcare Providers:  SeriousBroker.it  This test is no t yet approved or cleared by the [place] FDA and  has been authorized for detection and/or diagnosis of SARS-CoV-2 by FDA under an Emergency Use Authorization (EUA). This EUA will remain  in effect (meaning this test can be used) for the duration of the COVID-19 declaration under Section 564(b)(1) of the Act, 21 U.S.C.section 360bbb-3(b)(1), unless the authorization is terminated  or revoked sooner.       Influenza A by PCR NEGATIVE NEGATIVE Final   Influenza B by PCR NEGATIVE NEGATIVE Final    Comment: (NOTE) The Xpert Xpress SARS-CoV-2/FLU/RSV plus assay is intended as an aid in the diagnosis of influenza from Nasopharyngeal swab specimens and should not be used as a sole basis for treatment. Nasal washings and aspirates are unacceptable for Xpert Xpress SARS-CoV-2/FLU/RSV testing.  Fact Sheet for Patients: BloggerCourse.com  Fact Sheet for Healthcare Providers: SeriousBroker.it  This test is not yet approved or cleared by the [place] FDA and has been authorized for detection and/or diagnosis of SARS-CoV-2 by FDA under an Emergency Use Authorization (EUA). This EUA will remain in effect (meaning this test can be used) for the duration of the COVID-19 declaration under Section 564(b)(1) of the Act, 21 U.S.C. section 360bbb-3(b)(1), unless the authorization is terminated or revoked.     Resp Syncytial Virus by PCR NEGATIVE NEGATIVE Final    Comment: (NOTE) Fact Sheet for Patients: BloggerCourse.com  Fact Sheet for Healthcare Providers: SeriousBroker.it  This test is not yet approved or cleared by the [place] FDA and has been authorized for detection and/or diagnosis of  SARS-CoV-2 by FDA under an Emergency Use Authorization (EUA). This EUA will remain in effect (meaning this test can be used) for the duration of the COVID-19 declaration under Section 564(b)(1) of the Act, 21 U.S.C. section 360bbb-3(b)(1), unless the authorization is terminated or revoked.  Performed at [facility], [address] [place], [place] [address]   Culture, blood (routine x 2)     Status: None (Preliminary result)   Collection Time: 05/12/23 11:45 PM   Specimen: BLOOD  Result Value Ref Range Status   Specimen Description BLOOD RIGHT ANTECUBITAL  Final   Special Requests   Final    BOTTLES DRAWN AEROBIC AND ANAEROBIC Blood Culture adequate volume   Culture   Final    NO GROWTH 3 DAYS Performed at [facility], [address] [place] [address], [place], [place] [address]    Report Status PENDING  Incomplete  Culture, blood (routine x 2)     Status: None (Preliminary result)   Collection Time: 05/13/23 12:00 AM   Specimen: BLOOD  Result Value Ref Range Status   Specimen Description BLOOD LEFT WRIST  Final   Special Requests   Final    AEROBIC BOTTLE ONLY Blood Culture results may  not be optimal due to an inadequate volume of blood received in culture bottles   Culture   Final    NO GROWTH 3 DAYS Performed at [facility], [address] [place], [place] [address]    Report Status PENDING  Incomplete  Urine Culture (for pregnant, neutropenic or urologic patients or patients with an indwelling urinary catheter)     Status: Abnormal   Collection Time: 05/13/23 12:10 AM   Specimen: Urine, Clean Catch  Result Value Ref Range Status   Specimen Description   Final    URINE, CLEAN CATCH Performed at [facility], [address] [place], [place] [address]    Special Requests   Final    NONE Performed at [facility], [address] [place], [place] [address]    Culture >=100,000 COLONIES/mL KLEBSIELLA PNEUMONIAE (A)  Final   Report  Status 05/15/2023 FINAL  Final   Organism ID, Bacteria KLEBSIELLA PNEUMONIAE (A)  Final      Susceptibility   Klebsiella pneumoniae - MIC*    AMPICILLIN RESISTANT Resistant     CEFAZOLIN <=4 SENSITIVE Sensitive     CEFEPIME <=0.12 SENSITIVE Sensitive     CEFTRIAXONE <=0.25 SENSITIVE Sensitive     CIPROFLOXACIN <=0.25 SENSITIVE Sensitive     GENTAMICIN <=1 SENSITIVE Sensitive     IMIPENEM <=0.25 SENSITIVE Sensitive     NITROFURANTOIN <=16 SENSITIVE Sensitive     TRIMETH/SULFA <=20 SENSITIVE Sensitive     AMPICILLIN/SULBACTAM 4 SENSITIVE Sensitive     PIP/TAZO <=4 SENSITIVE Sensitive ug/mL    * >=100,000 COLONIES/mL KLEBSIELLA PNEUMONIAE  MRSA Next Gen by PCR, Nasal     Status: Abnormal   Collection Time: 05/14/23  4:30 PM   Specimen: Nasal Mucosa; Nasal Swab  Result Value Ref Range Status   MRSA by PCR Next Gen DETECTED (A) NOT DETECTED Final    Comment: RESULT CALLED TO, READ BACK BY AND VERIFIED WITH: DORATHY MUHORO @1908  ON 05/14/23 SKL (NOTE) The GeneXpert MRSA Assay (FDA approved for NASAL specimens only), is one component of a comprehensive MRSA colonization surveillance program. It is not intended to diagnose MRSA infection nor to guide or monitor treatment for MRSA infections. Test performance is not FDA approved in patients less than 40 years old. Performed at [facility], [address] [place], [place] [address]     Coagulation Studies: No results for input(s): "LABPROT", "INR" in the last 72 hours.  Urinalysis: No results for input(s): "COLORURINE", "LABSPEC", "PHURINE", "GLUCOSEU", "HGBUR", "BILIRUBINUR", "KETONESUR", "PROTEINUR", "UROBILINOGEN", "NITRITE", "LEUKOCYTESUR" in the last 72 hours.  Invalid input(s): "APPERANCEUR"    Imaging: DG Chest Port 1 View Result Date: 05/16/2023 CLINICAL DATA:  Shortness of breath during dialysis EXAM: PORTABLE CHEST 1 VIEW COMPARISON:  05/12/2023 FINDINGS: Right IJ  dialysis catheter tip at low SVC. Midline  trachea. Normal heart size for level of inspiration. Mild right hemidiaphragm elevation. No pleural effusion or pneumothorax. Diffuse interstitial prominence. More confluent right mid and upper lung airspace disease is slightly more distinct today. Mild left base subsegmental atelectasis is unchanged. Numerous leads and wires project over the chest. IMPRESSION: Right mid and upper lung airspace disease is slightly more distinct today, favoring pneumonia over asymmetric pulmonary edema. Electronically Signed   By: Jeronimo Greaves M.D.   On: 05/16/2023 12:14     Medications:    cefTRIAXone (ROCEPHIN)  IV Stopped (05/15/23 0914)   feeding supplement (NEPRO CARB STEADY) 1,000 mL (05/15/23 2257)   vancomycin 1,750 mg (05/16/23 1301)   [  START ON 05/19/2023] vancomycin      ascorbic acid  250 mg Oral Daily   atorvastatin  20 mg Oral Daily   vitamin D3  2,000 Units Oral Daily   cholestyramine  4 g Oral TID   escitalopram  10 mg Oral Daily   famotidine  10 mg Oral Daily   fludrocortisone  0.1 mg Oral Daily   free water  30 mL Per Tube Q4H   heparin injection (subcutaneous)  5,000 Units Subcutaneous Q8H   hydrOXYzine  50 mg Oral TID   ipratropium-albuterol  3 mL Nebulization Q6H   methylPREDNISolone (SOLU-MEDROL) injection  40 mg Intravenous Daily   metoCLOPramide  5 mg Oral TID AC & HS   midodrine  10 mg Oral TID with meals   multivitamin  1 tablet Per Tube QHS   mupirocin ointment  1 Application Nasal BID   nutrition supplement (JUVEN)  1 packet Per Tube BID BM   nystatin   Topical TID   acetaminophen **OR** acetaminophen, chlorpheniramine-HYDROcodone, diphenhydrAMINE, liver oil-zinc oxide, loperamide HCl, LORazepam, magnesium hydroxide, ondansetron **OR** ondansetron (ZOFRAN) IV, traZODone  Assessment/ Plan:  [name] is a 73 y.o.  female with history of hypertension, coronary artery disease, congestive heart failure, COPD, cirrhosis, diabetes, status post left AKA, status post  G-tube placement, and end stage renal disease on hemodialysis.  Patient presents to ED from dialysis at the request of family due to altered mental status.  Patient has been admitted for Lower urinary tract infectious disease [N39.0] Altered mental status, unspecified altered mental status type [R41.82] Pneumonia due to infectious organism, unspecified laterality, unspecified part of lung [J18.9] Sepsis due to gram-negative UTI (HCC) [A41.50, N39.0]  Principal Problem:   Acute on chronic diastolic CHF (congestive heart failure) (HCC) Active Problems:   Severe sepsis (HCC)   Acute hypoxic respiratory failure (HCC)   Acute metabolic encephalopathy   Anxiety and depression   ESRD on hemodialysis (HCC)   Obesity (BMI 30-39.9)   Aspiration pneumonia (HCC)   Stage II decubitus ulcer (HCC)   Dyslipidemia   Hypotension   Itching   Pneumonia due to infectious organism   Urinary tract infection due to Klebsiella species   Hyperkalemia   End Stage Renal Disease on hemodialysis: Will attempt fluid removal as tolerated.  Will continue with albumin infusion.  Potassium [facility] vascular lab)  Date Value Ref Range Status  03/23/2023 4.7 3.5 - 5.1 mmol/L Final    Comment:    Performed at [facility], [address] [place], [place] [address]    Intake/Output Summary (Last 24 hours) at 05/16/2023 1304 Last data filed at 05/16/2023 0630 Gross per 24 hour  Intake 1647.43 ml  Output --  Net 1647.43 ml    2. Hypertension with chronic kidney disease: Blood pressure has been low.  Will continue midodrine.   BP (!) 90/58 (BP Location: Right Arm)   Pulse 92   Temp 99.3 F (37.4 C) (Axillary)   Resp (!) 23   Ht 5\' 4"  (1.626 m)   Wt 76.2 kg   SpO2 100%   BMI 28.84 kg/m   3. Anemia of chronic kidney disease/ kidney injury/chronic disease/acute blood loss: Continue anemia protocol. Lab Results  Component Value Date   HGB 10.9 (L) 05/16/2023    4. Secondary Hyperparathyroidism:  Will monitor closely.   Lab Results  Component Value Date   CALCIUM 9.2 05/16/2023   PHOS 4.0 05/14/2023    5: Sepsis: Patient has MRSA positive  and has been on vancomycin and Rocephin.  Overall prognosis is very poor. Continue supportive care.   LOS: 3 Lorain Childes, MD [facility] 2/1/20251:04 PM

## 2023-05-16 NOTE — Consult Note (Signed)
Pharmacy Antibiotic Note  Alexandra Foster is a 73 y.o. female admitted on 05/13/2023 with sepsis.  They presented with confusion and over the last three days intermittent vomiting. They've been treated with antibiotics for sepsis secondary to right upper lobe pneumonia. During hospitalization they were on 3 days of ceftriaxone and they last two days they're been on doxycycline. Today, 2/1 they had a rapid response in dialysis, they become less responsive and had SOB. Provider consulted pharmacy for Vancomycin dosing and to stop doxycycline. Provider has now consulted pharmacy to dose Zosyn for aspiration pneumonia.  Today, 05/16/2023 BP < 90 SBP HR 80s Tmax 99.3 WBC 29.1  Plan: Give loading dose 1750 mg (23 mg/kg) post dialysis session Plan to dose vancomycin post dialysis at 750 mg (patient between 61 - 80 kg) Start Zosyn 2.25 g IV q8h  Follow for LOT  Height: 5\' 4"  (162.6 cm) Weight: 76.4 kg (168 lb 6.9 oz) IBW/kg (Calculated) : 54.7  Temp (24hrs), Avg:98.8 F (37.1 C), Min:97.8 F (36.6 C), Max:99.3 F (37.4 C)  Recent Labs  Lab 05/12/23 1743 05/12/23 2345 05/13/23 0000 05/13/23 0224 05/13/23 0522 05/14/23 0847 05/15/23 0324 05/16/23 0414  WBC 20.8*  --   --   --  5.6 27.3* 27.5* 29.1*  CREATININE  --  3.61*  --   --  3.95* 5.64*  --  4.70*  LATICACIDVEN  --   --  2.3* 1.7  --   --   --   --     Estimated Creatinine Clearance: 10.5 mL/min (A) (by C-G formula based on SCr of 4.7 mg/dL (H)).    Allergies  Allergen Reactions   Chlorhexidine Dermatitis   Augmentin [Amoxicillin-Pot Clavulanate] Dermatitis   Antimicrobials this admission: 1/29 - 1/31 Zithromax 1/31 - 2/1 Doxycycline 1/29 - present: Ceftriaxone 2/1 - present: Vancomycin  Dose adjustments this admission: NA  Microbiology results: 1/29 BCx: NGTD 1/29 UCx: Klebsiella pneumoniae  1/20 MRSA PCR: Detected  No new cultures collected to date  Thank you for allowing pharmacy to be a part of this  patient's care.  Merryl Hacker, PharmD Clinical Pharmacist  05/16/2023 5:23 PM

## 2023-05-16 NOTE — Consult Note (Signed)
Pharmacy Antibiotic Note  Alexandra Foster is a 73 y.o. female admitted on 05/13/2023 with sepsis.  They presented with confusion and over the last three days intermittent vomiting. They've been treated with antibiotics for sepsis secondary to right upper lobe pneumonia. During hospitalization they were on 3 days of ceftriaxone and they last two days they're been on doxycycline. Today, 2/1 they had a rapid response in dialysis, they become less responsive and had SOB. Provider consulted pharmacy for Vancomycin dosing and to stop doxycycline.  Today, 05/16/2023 BP < 90 SBP HR 80s Tmax 99.3 WBC 29.1  Plan: Give loading dose 1750 mg (23 mg/kg) post dialysis session Plan to dose vancomycin post dialysis at 750 mg (patient between 61 - 80 kg) Follow for LOT  Height: 5\' 4"  (162.6 cm) Weight: 76.2 kg (167 lb 15.9 oz) IBW/kg (Calculated) : 54.7  Temp (24hrs), Avg:99 F (37.2 C), Min:98.5 F (36.9 C), Max:99.3 F (37.4 C)  Recent Labs  Lab 05/12/23 1743 05/12/23 2345 05/13/23 0000 05/13/23 0224 05/13/23 0522 05/14/23 0847 05/15/23 0324 05/16/23 0414  WBC 20.8*  --   --   --  5.6 27.3* 27.5* 29.1*  CREATININE  --  3.61*  --   --  3.95* 5.64*  --  4.70*  LATICACIDVEN  --   --  2.3* 1.7  --   --   --   --     Estimated Creatinine Clearance: 10.5 mL/min (A) (by C-G formula based on SCr of 4.7 mg/dL (H)).    Allergies  Allergen Reactions   Chlorhexidine Dermatitis   Augmentin [Amoxicillin-Pot Clavulanate] Dermatitis   Antimicrobials this admission: 1/29 - 1/31 Zithromax 1/31 - 2/1 Doxycycline 1/29 - present: Ceftriaxone 2/1 - present: Vancomycin  Dose adjustments this admission: NA  Microbiology results: 1/29 BCx: NGTD 1/29 UCx: Klebsiella pneumoniae  1/20 MRSA PCR: Detected  No new cultures collected to date  Thank you for allowing pharmacy to be a part of this patient's care.  Effie Shy, PharmD Pharmacy Resident  05/16/2023 11:56 AM

## 2023-05-16 NOTE — Progress Notes (Addendum)
On reevaluation, patient breathing much better than she was this morning.  On 3 L of oxygen breathing comfortably.  Patient feels better than she did this morning.  Physical Exam Cardiovascular:     Rate and Rhythm: Normal rate and regular rhythm.     Heart sounds: Normal heart sounds, S1 normal and S2 normal.  Pulmonary:     Breath sounds: Examination of the right-middle field reveals wheezing. Examination of the right-lower field reveals decreased breath sounds and rhonchi. Examination of the left-lower field reveals decreased breath sounds and rhonchi. Decreased breath sounds, wheezing and rhonchi present.     CT chest does not show any pulmonary embolism but worsening pneumonia.  Patient likely had a vasovagal syncope after a large bowel movement down at dialysis and then started rapidly breathing.  Dialysis removed some fluid.  Switch doxycycline over to vancomycin.  Gave a dose of Solu-Medrol.  Will change his Rocephin over to Zosyn.  Reassess again tomorrow.  Dr. Alford Highland 15 minutes

## 2023-05-16 NOTE — Assessment & Plan Note (Signed)
Spoke with nephrology to remove fluid.  Will give IV albumin.  Continue Florinef and midodrine for blood pressure.   BiPAP for right now.  ABGs, chest x-ray

## 2023-05-16 NOTE — Progress Notes (Signed)
Late note Received patient in bed to unit.  Alert and oriented.  Informed consent signed and in chart.   TX duration: 2.5 hours  Patient became hypotensive despite administered albumin see AR Transported back to the room  Alert, without acute distress.  Hand-off given to patient's nurse Shanda Bumps  Access used: RIJ Access issues: venous line would not aspirate reversed lines. Setup machine 3 times   Total UF removed: 0 Medication(s) given: Albumin, epo, and   Vanco Post HD VS: 92/60 Post HD weight: 76.4kg  Freddie Breech, RN [facility]

## 2023-05-16 NOTE — Significant Event (Signed)
Rapid Response Event Note   Reason for Call : called for short of breath, hypotention   Initial Focused Assessment: laying in bed, receiving dialysis treatment (been on for 15 mins). Noted bowel movement, pt able to talk, answer questions. Tachypnea noted. See flowsheets for VS      Interventions: Dr Hilton Sinclair to bedside, CXR, ABG, EKG, bipap, and albumin ordered. He also spoke with Renal on call about fluid removal.    Plan of Care: If pt remains on bipap will need progressive transfer...    Event Summary: as above  MD Notified: [place] [phone] Call [id number] Arrival [id number] End [id number]  Epimenio Schetter A, RN

## 2023-05-16 NOTE — Plan of Care (Signed)
  Problem: Clinical Measurements: Goal: Diagnostic test results will improve Outcome: Progressing Goal: Signs and symptoms of infection will decrease Outcome: Progressing   Problem: Respiratory: Goal: Ability to maintain adequate ventilation will improve Outcome: Progressing   Problem: Education: Goal: Knowledge of General Education information will improve Description: Including pain rating scale, medication(s)/side effects and non-pharmacologic comfort measures Outcome: Progressing   Problem: Health Behavior/Discharge Planning: Goal: Ability to manage health-related needs will improve Outcome: Progressing   Problem: Clinical Measurements: Goal: Ability to maintain clinical measurements within normal limits will improve Outcome: Progressing Goal: Will remain free from infection Outcome: Progressing Goal: Diagnostic test results will improve Outcome: Progressing Goal: Respiratory complications will improve Outcome: Progressing Goal: Cardiovascular complication will be avoided Outcome: Progressing   Problem: Activity: Goal: Risk for activity intolerance will decrease Outcome: Progressing   Problem: Nutrition: Goal: Adequate nutrition will be maintained Outcome: Progressing   Problem: Coping: Goal: Level of anxiety will decrease Outcome: Progressing   Problem: Elimination: Goal: Will not experience complications related to bowel motility Outcome: Progressing Goal: Will not experience complications related to urinary retention Outcome: Progressing   Problem: Pain Managment: Goal: General experience of comfort will improve and/or be controlled Outcome: Progressing   Problem: Safety: Goal: Ability to remain free from injury will improve Outcome: Progressing   Problem: Skin Integrity: Goal: Risk for impaired skin integrity will decrease Outcome: Progressing   Problem: Fluid Volume: Goal: Hemodynamic stability will improve Outcome: Progressing

## 2023-05-16 NOTE — Progress Notes (Signed)
   05/16/23 0900  Spiritual Encounters  Type of Visit Initial  Referral source Code page  Reason for visit Code  OnCall Visit Yes   Chaplain responded to Rapid. Patient being cared for and no family was present at the time.

## 2023-05-17 DIAGNOSIS — N390 Urinary tract infection, site not specified: Secondary | ICD-10-CM | POA: Diagnosis not present

## 2023-05-17 DIAGNOSIS — A419 Sepsis, unspecified organism: Secondary | ICD-10-CM | POA: Diagnosis not present

## 2023-05-17 DIAGNOSIS — J9601 Acute respiratory failure with hypoxia: Secondary | ICD-10-CM | POA: Diagnosis not present

## 2023-05-17 DIAGNOSIS — I9589 Other hypotension: Secondary | ICD-10-CM | POA: Diagnosis not present

## 2023-05-17 LAB — CBC
HCT: 30.8 % — ABNORMAL LOW (ref 36.0–46.0)
Hemoglobin: 9.9 g/dL — ABNORMAL LOW (ref 12.0–15.0)
MCH: 26.3 pg (ref 26.0–34.0)
MCHC: 32.1 g/dL (ref 30.0–36.0)
MCV: 81.7 fL (ref 80.0–100.0)
Platelets: 176 10*3/uL (ref 150–400)
RBC: 3.77 MIL/uL — ABNORMAL LOW (ref 3.87–5.11)
RDW: 15.8 % — ABNORMAL HIGH (ref 11.5–15.5)
WBC: 26.3 10*3/uL — ABNORMAL HIGH (ref 4.0–10.5)
nRBC: 0.3 % — ABNORMAL HIGH (ref 0.0–0.2)

## 2023-05-17 LAB — BASIC METABOLIC PANEL
Anion gap: 13 (ref 5–15)
BUN: 43 mg/dL — ABNORMAL HIGH (ref 8–23)
CO2: 24 mmol/L (ref 22–32)
Calcium: 8.7 mg/dL — ABNORMAL LOW (ref 8.9–10.3)
Chloride: 98 mmol/L (ref 98–111)
Creatinine, Ser: 3.97 mg/dL — ABNORMAL HIGH (ref 0.44–1.00)
GFR, Estimated: 11 mL/min — ABNORMAL LOW (ref >60)
Glucose, Bld: 95 mg/dL (ref 70–99)
Potassium: 3.2 mmol/L — ABNORMAL LOW (ref 3.5–5.1)
Sodium: 135 mmol/L (ref 135–145)

## 2023-05-17 MED ORDER — ORAL CARE MOUTH RINSE: 15.0000 mL | OROMUCOSAL | Status: DC | PRN

## 2023-05-17 MED ORDER — MIDODRINE HCL 5 MG PO TABS
15.0000 mg | ORAL_TABLET | Freq: Three times a day (TID) | ORAL | Status: DC
  Administered 2023-05-17 – 2023-05-25 (×22): 15 mg via ORAL
  Filled 2023-05-17 (×21): qty 3

## 2023-05-17 MED ORDER — ORAL CARE MOUTH RINSE
15.0000 mL | OROMUCOSAL | Status: DC
  Administered 2023-05-17 – 2023-05-27 (×37): 15 mL via OROMUCOSAL

## 2023-05-17 NOTE — Plan of Care (Signed)

## 2023-05-17 NOTE — Progress Notes (Signed)
Progress Note   Patient: Alexandra Foster RUE:[id number] DOB: Oct 19, 1950 DOA: 05/13/2023     4 DOS: the patient was seen and examined on 05/17/2023   Brief hospital course: 73 year old female with end-stage renal disease on hemodialysis, osteoarthritis, asthma, COPD, type 2 diabetes mellitus, GERD, hypertension and paroxysmal atrial fibrillation and obstructive sleep apnea on CPAP presented to the ER with confusion over the last 3 days with intermittent vomiting.  She was admitted with clinical sepsis secondary to right upper lobe pneumonia likely aspiration and urinary infection.  Started on antibiotic.  1/29.  Patient has some cough and shortness of breath.  Does not wear oxygen at home.  Blood pressure very high in the right arm and very low in the left arm.  CT angio of the left arm does not show any blockages.  May have a steal from the right arm with a catheter in the right chest.  Feeding tube confirmed then placed last night and can restart tube feeds.  Had a fever of 103. 1/30.  Patient asking for water.  Patient gets all of her nutrition through her tube. 1/31.  Patient complains of dry mouth. 2/1.  Called to rapid response at dialysis.  Patient had large brown BM then was less responsive and has shortness of breath.  Bipap ordered.  ABG.  Iv albumin.  Spoke with nephrology to remove fluid.  CT scan showing worsening pneumonia.  Antibiotics switched to Zosyn and vancomycin 2/3.  Respiratory status much improved from yesterday.  Off BiPAP and on 3 L of oxygen.  Will try to taper oxygen.  Blood pressure on the lower side will increase midodrine to 15 mg 3 times daily.   Assessment and Plan: * Severe sepsis (HCC) Present on admission with leukocytosis fever tachycardia and tachypnea and acute respiratory failure.  Patient has right upper lobe pneumonia and Klebsiella growing out of urine culture.  White count still very elevated.  Antibiotics switched yesterday to vancomycin and  Zosyn.  Acute hypoxic respiratory failure (HCC) With respiratory distress on 2/1 was placed on BiPAP.  Now on 3 L we will try to taper to off.  Hypotension On midodrine and florinef for chronically low blood pressure.  Increase midodrine to 15 mg 3 times daily.  Aspiration pneumonia (HCC) Right upper lobar pneumonia.  Continue Zosyn and vancomycin  Acute metabolic encephalopathy Mental status improved.  ESRD on hemodialysis [facility]) Dialysis yesterday  Dyslipidemia On Lipitor  Acute on chronic diastolic CHF (congestive heart failure) (HCC) Likely more worsening pneumonia rather than heart failure.  Unable to remove much fluid with dialysis secondary to hypotension.  Hyperkalemia Improved with dialysis  Urinary tract infection due to Klebsiella species Antibiotics switched to Zosyn  Itching On atarax standing dose and as needed Benadryl  Stage II decubitus ulcer (HCC) Buttocks, see full description below, documented on 1/30.  Obesity (BMI 30-39.9) BMI 29.78  Anxiety and depression On Klonopin and Lexapro.        Subjective: Patient again asking for water.  Blood pressure running on the lower side.  Admitted with pneumonia and urinary infection, with sepsis  Physical Exam: Vitals:   05/17/23 0610 05/17/23 0618 05/17/23 0652 05/17/23 1250  BP: (!) 87/47  (!) 96/49 (!) 89/47  Pulse: 71  78 78  Resp:   (!) 22 (!) 22  Temp:   99 F (37.2 C)   TempSrc:   Axillary   SpO2:    99%  Weight:  78.7 kg    Height:  Physical Exam HENT:     Head: Normocephalic.  Eyes:     General: Lids are normal.     Conjunctiva/sclera: Conjunctivae normal.  Cardiovascular:     Rate and Rhythm: Normal rate and regular rhythm.     Heart sounds: Normal heart sounds, S1 normal and S2 normal.  Pulmonary:     Effort: No accessory muscle usage.     Breath sounds: Examination of the right-lower field reveals decreased breath sounds and rhonchi. Examination of the left-lower field  reveals decreased breath sounds and rhonchi. Decreased breath sounds and rhonchi present. No wheezing or rales.  Abdominal:     Palpations: Abdomen is soft.     Tenderness: There is no abdominal tenderness.  Musculoskeletal:     Right lower leg: No swelling.     Left Lower Extremity: Left leg is amputated above knee.  Skin:    General: Skin is warm.     Findings: No rash.  Neurological:     Mental Status: She is alert.     Data Reviewed: White blood cell count down to 26.3, hemoglobin 9.9, creatinine 3.97, potassium 3.2  Family Communication: Spoke with daughter on the phone  Disposition: Status is: Inpatient Remains inpatient appropriate because: Switched antibiotics yesterday.  White blood cell count still high.  Planned Discharge Destination: Home with Home Health    Time spent: 28 minutes  Author: Alford Highland, MD 05/17/2023 2:31 PM  For on call review [email or web address].

## 2023-05-17 NOTE — Progress Notes (Signed)
[facility]  PROGRESS NOTE   Subjective:   Patient is awake and alert.  Blood pressure has been lower.  Was dialyzed yesterday.  Did not tolerate fluid removal.  Objective:  Vital signs: Blood pressure (!) 89/47, pulse 78, temperature 99 F (37.2 C), temperature source Axillary, resp. rate (!) 22, height 5\' 4"  (1.626 m), weight 78.7 kg, SpO2 99%.  Intake/Output Summary (Last 24 hours) at 05/17/2023 1252 Last data filed at 05/17/2023 0519 Gross per 24 hour  Intake 1237.5 ml  Output 0 ml  Net 1237.5 ml   Filed Weights   05/16/23 0811 05/16/23 1400 05/17/23 0618  Weight: 76.2 kg 76.4 kg 78.7 kg     Physical Exam: General:  No acute distress  Head:  Normocephalic, atraumatic. Moist oral mucosal membranes  Eyes:  Anicteric  Neck:  Supple  Lungs:   Clear to auscultation, normal effort  Heart:  S1S2 no rubs  Abdomen:   Soft, nontender, bowel sounds present  Extremities: 1+ peripheral edema.  Neurologic:  Awake, alert, following commands  Skin:  No lesions  Access:     Basic Metabolic Panel: Recent Labs  Lab 05/12/23 2345 05/13/23 0522 05/14/23 0847 05/16/23 0414 05/17/23 0623  NA 136 135 134* 137 135  K 4.7 4.1 5.2* 3.4* 3.2*  CL 94* 103 101 101 98  CO2 21* 20* 18* 23 24  GLUCOSE 142* 123* 137* 126* 95  BUN 26* 30* 51* 48* 43*  CREATININE 3.61* 3.95* 5.64* 4.70* 3.97*  CALCIUM 9.5 8.6* 8.9 9.2 8.7*  PHOS  --   --  4.0  --   --    GFR: Estimated Creatinine Clearance: 12.6 mL/min (A) (by C-G formula based on SCr of 3.97 mg/dL (H)).  Liver Function Tests: Recent Labs  Lab 05/12/23 2345 05/14/23 0847  AST 81*  --   ALT 64*  --   ALKPHOS 190*  --   BILITOT 0.9  --   PROT 8.9*  --   ALBUMIN 3.2* 2.2*   No results for input(s): "LIPASE", "AMYLASE" in the last 168 hours. No results for input(s): "AMMONIA" in the last 168 hours.  CBC: Recent Labs  Lab 05/13/23 0522 05/14/23 0847 05/15/23 0324 05/16/23 0414 05/17/23 0623  WBC 5.6 27.3* 27.5*  29.1* 26.3*  NEUTROABS  --   --  19.4* 17.2*  --   HGB 13.8 13.2 11.7* 10.9* 9.9*  HCT 46.0 42.1 36.0 35.0* 30.8*  MCV 86.6 82.5 82.2 83.3 81.7  PLT 332 432* 270 296 176     HbA1C: Hgb A1c MFr Bld  Date/Time Value Ref Range Status  02/11/2023 02:55 PM 4.7 <5.7 % of total Hgb Final    Comment:    For the purpose of screening for the presence of diabetes: . <5.7%       Consistent with the absence of diabetes 5.7-6.4%    Consistent with increased risk for diabetes             (prediabetes) > or =6.5%  Consistent with diabetes . This assay result is consistent with a decreased risk of diabetes. . Currently, no consensus exists regarding use of hemoglobin A1c for diagnosis of diabetes in children. . According to American Diabetes Association (ADA) guidelines, hemoglobin A1c <7.0% represents optimal control in non-pregnant diabetic patients. Different metrics may apply to specific patient populations.  Standards of Medical Care in Diabetes(ADA). [name]   12/07/2022 02:13 AM 5.2 4.8 - 5.6 % Final    Comment:    (NOTE)  Pre diabetes:          5.7%-6.4%  Diabetes:              >6.4%  Glycemic control for   <7.0% adults with diabetes     Urinalysis: No results for input(s): "COLORURINE", "LABSPEC", "PHURINE", "GLUCOSEU", "HGBUR", "BILIRUBINUR", "KETONESUR", "PROTEINUR", "UROBILINOGEN", "NITRITE", "LEUKOCYTESUR" in the last 72 hours.  Invalid input(s): "APPERANCEUR"    Imaging: CT Angio Chest Pulmonary Embolism (PE) W or WO Contrast Result Date: 05/16/2023 CLINICAL DATA:  Shortness of breath. EXAM: CT ANGIOGRAPHY CHEST WITH CONTRAST TECHNIQUE: Multidetector CT imaging of the chest was performed using the standard protocol during bolus administration of intravenous contrast. Multiplanar CT image reconstructions and MIPs were obtained to evaluate the vascular anatomy. RADIATION DOSE REDUCTION: This exam was performed according to the departmental dose-optimization program which  includes automated exposure control, adjustment of the mA and/or kV according to patient size and/or use of iterative reconstruction technique. CONTRAST:  75mL OMNIPAQUE IOHEXOL 350 MG/ML SOLN COMPARISON:  February 27, 2023. FINDINGS: Cardiovascular: Satisfactory opacification of the pulmonary arteries to the segmental level. No evidence of pulmonary embolism. Normal heart size. No pericardial effusion. Mediastinum/Nodes: No enlarged mediastinal, hilar, or axillary lymph nodes. Thyroid gland, trachea, and esophagus demonstrate no significant findings. Lungs/Pleura: No pneumothorax or pleural effusion is noted. Significantly increased patchy airspace opacities are noted throughout the right lung and to a lesser degree in the left lung most consistent with worsening multifocal pneumonia. Upper Abdomen: Hepatic cirrhosis. Musculoskeletal: No chest wall abnormality. No acute or significant osseous findings. Review of the MIP images confirms the above findings. IMPRESSION: No definite evidence of pulmonary embolus. Significant increased patchy airspace opacities are noted throughout both lungs, right much greater than left, most consistent with worsening multifocal pneumonia. Hepatic cirrhosis. Electronically Signed   By: Lupita Raider M.D.   On: 05/16/2023 17:08   DG Chest Port 1 View Result Date: 05/16/2023 CLINICAL DATA:  Shortness of breath during dialysis EXAM: PORTABLE CHEST 1 VIEW COMPARISON:  05/12/2023 FINDINGS: Right IJ  dialysis catheter tip at low SVC. Midline trachea. Normal heart size for level of inspiration. Mild right hemidiaphragm elevation. No pleural effusion or pneumothorax. Diffuse interstitial prominence. More confluent right mid and upper lung airspace disease is slightly more distinct today. Mild left base subsegmental atelectasis is unchanged. Numerous leads and wires project over the chest. IMPRESSION: Right mid and upper lung airspace disease is slightly more distinct today, favoring  pneumonia over asymmetric pulmonary edema. Electronically Signed   By: Jeronimo Greaves M.D.   On: 05/16/2023 12:14     Medications:    feeding supplement (NEPRO CARB STEADY) 45 mL/hr at 05/17/23 0400   piperacillin-tazobactam (ZOSYN)  IV 2.25 g (05/17/23 1113)   [START ON 05/19/2023] vancomycin      ascorbic acid  250 mg Oral Daily   atorvastatin  20 mg Oral Daily   vitamin D3  2,000 Units Oral Daily   cholestyramine  4 g Oral TID   escitalopram  10 mg Oral Daily   famotidine  10 mg Oral Daily   fludrocortisone  0.1 mg Oral Daily   free water  30 mL Per Tube Q4H   heparin injection (subcutaneous)  5,000 Units Subcutaneous Q8H   hydrOXYzine  50 mg Oral TID   methylPREDNISolone (SOLU-MEDROL) injection  40 mg Intravenous Daily   metoCLOPramide  5 mg Oral TID AC & HS   midodrine  10 mg Oral TID with meals   multivitamin  1 tablet  Per Tube QHS   mupirocin ointment  1 Application Nasal BID   nutrition supplement (JUVEN)  1 packet Per Tube BID BM   nystatin   Topical TID   mouth rinse  15 mL Mouth Rinse 4 times per day    Assessment/ Plan:     73 y.o.  female with history of hypertension, coronary artery disease, congestive heart failure, COPD, cirrhosis, diabetes, status post left AKA, status post G-tube placement, and end stage renal disease on hemodialysis.  Patient presents to ED from dialysis at the request of family due to altered mental status.  Patient has been admitted for Lower urinary tract infectious disease [N39.0]. Altered mental status, unspecified altered mental status type [R41.82].Pneumonia due to infectious organism, unspecified laterality, unspecified part of lung [J18.9] Sepsis due to gram-negative UTI (HCC) [A41.50, N39.0]   Principal Problem:   Acute on chronic diastolic CHF (congestive heart failure) (HCC) Active Problems:   Severe sepsis (HCC)   Acute hypoxic respiratory failure (HCC)   Acute metabolic encephalopathy   Anxiety and depression   ESRD on  hemodialysis (HCC)   Obesity (BMI 30-39.9)   Aspiration pneumonia (HCC)   Stage II decubitus ulcer (HCC)   Dyslipidemia   Hypotension   Itching   Pneumonia due to infectious organism   Urinary tract infection due to Klebsiella species   Hyperkalemia     End Stage Renal Disease on hemodialysis: She was dialyzed yesterday.  Did not tolerate fluid removal. Will continue with albumin infusion at dialysis.  Overall prognosis is poor.  2: Aspiration pneumonia/sepsis: Patient is MRSA positive and has been on vancomycin and Rocephin.  3: Hypotension: Patient has been on midodrine prior to dialysis.  Will continue the same.  4: Anemia: Anemia secondary to chronic kidney disease.  Will continue anemia protocols.  Overall prognosis is poor. Labs and medications reviewed. Will continue to follow along with you.   LOS: 4 Lorain Childes, MD [facility] 2/2/202512:52 PM

## 2023-05-17 NOTE — Progress Notes (Addendum)
This patients Systolic BP has been from 77 to 94. Diastolic BP from 16'X to 60 for the previous 24 hours.  She is alert, confused. Follows commands.  She has no midodrine scheduled or PRN.

## 2023-05-18 DIAGNOSIS — J4489 Other specified chronic obstructive pulmonary disease: Secondary | ICD-10-CM

## 2023-05-18 DIAGNOSIS — I120 Hypertensive chronic kidney disease with stage 5 chronic kidney disease or end stage renal disease: Secondary | ICD-10-CM

## 2023-05-18 DIAGNOSIS — E43 Unspecified severe protein-calorie malnutrition: Secondary | ICD-10-CM

## 2023-05-18 DIAGNOSIS — I480 Paroxysmal atrial fibrillation: Secondary | ICD-10-CM

## 2023-05-18 DIAGNOSIS — R197 Diarrhea, unspecified: Secondary | ICD-10-CM

## 2023-05-18 DIAGNOSIS — I9589 Other hypotension: Secondary | ICD-10-CM | POA: Diagnosis not present

## 2023-05-18 DIAGNOSIS — L89312 Pressure ulcer of right buttock, stage 2: Secondary | ICD-10-CM

## 2023-05-18 DIAGNOSIS — E2740 Unspecified adrenocortical insufficiency: Secondary | ICD-10-CM

## 2023-05-18 DIAGNOSIS — J189 Pneumonia, unspecified organism: Secondary | ICD-10-CM | POA: Diagnosis not present

## 2023-05-18 DIAGNOSIS — A419 Sepsis, unspecified organism: Secondary | ICD-10-CM | POA: Diagnosis not present

## 2023-05-18 DIAGNOSIS — E1122 Type 2 diabetes mellitus with diabetic chronic kidney disease: Secondary | ICD-10-CM

## 2023-05-18 DIAGNOSIS — L89892 Pressure ulcer of other site, stage 2: Secondary | ICD-10-CM

## 2023-05-18 DIAGNOSIS — Z931 Gastrostomy status: Secondary | ICD-10-CM

## 2023-05-18 DIAGNOSIS — Z992 Dependence on renal dialysis: Secondary | ICD-10-CM

## 2023-05-18 DIAGNOSIS — J9601 Acute respiratory failure with hypoxia: Secondary | ICD-10-CM | POA: Diagnosis not present

## 2023-05-18 DIAGNOSIS — N186 End stage renal disease: Secondary | ICD-10-CM

## 2023-05-18 DIAGNOSIS — L89322 Pressure ulcer of left buttock, stage 2: Secondary | ICD-10-CM

## 2023-05-18 LAB — CULTURE, BLOOD (ROUTINE X 2)
Culture: NO GROWTH
Culture: NO GROWTH
Special Requests: ADEQUATE

## 2023-05-18 MED ORDER — ALTEPLASE 2 MG IJ SOLR
INTRAMUSCULAR | Status: AC
  Filled 2023-05-18: qty 4

## 2023-05-18 MED ORDER — STERILE WATER FOR INJECTION IJ SOLN
INTRAMUSCULAR | Status: AC
  Filled 2023-05-18: qty 10

## 2023-05-18 MED ORDER — ALTEPLASE 2 MG IJ SOLR
2.0000 mg | Freq: Once | INTRAMUSCULAR | Status: AC | PRN
  Administered 2023-05-18: 2 mg

## 2023-05-18 MED ORDER — JUVEN PO PACK
1.0000 | PACK | Freq: Two times a day (BID) | ORAL | Status: DC
  Administered 2023-05-18 – 2023-05-24 (×9): 1 via ORAL

## 2023-05-18 MED ORDER — RENA-VITE PO TABS
1.0000 | ORAL_TABLET | Freq: Every day | ORAL | Status: DC
  Administered 2023-05-18 – 2023-05-26 (×9): 1 via ORAL
  Filled 2023-05-18 (×10): qty 1

## 2023-05-18 MED ORDER — DIPHENHYDRAMINE HCL 12.5 MG/5ML PO ELIX
12.5000 mg | ORAL_SOLUTION | Freq: Three times a day (TID) | ORAL | Status: DC | PRN
  Administered 2023-05-22 – 2023-05-24 (×3): 12.5 mg via ORAL
  Filled 2023-05-18 (×4): qty 5

## 2023-05-18 MED ORDER — LOPERAMIDE HCL 1 MG/7.5ML PO SUSP
4.0000 mg | Freq: Four times a day (QID) | ORAL | Status: DC | PRN
  Administered 2023-05-19 – 2023-05-24 (×9): 4 mg via ORAL
  Filled 2023-05-18 (×12): qty 30

## 2023-05-18 NOTE — Evaluation (Signed)
Physical Therapy Evaluation Patient Details Name: Alexandra Foster MRN: [id number] DOB: 29-Jan-1951 Today's Date: 05/18/2023  History of Present Illness  Pt is a 73 y.o. female presenting to hospital 05/12/23 with c/o AMS; vomited at dialysis.  Pt admitted with sepsis d/t gram-negative UTI, aspiration PNA, and acute metabolic encephalopathy.  PMH includes ESRD on HD, OA, asthma, COPD, DM type 2, htn, paroxysmal a-fib, OSA on CPAP, trach, L AKA.  Clinical Impression  Prior to recent medical concerns, pt reports using hoyer lift to transfer to w/c; lives with family on main level of home with ramp to enter.  Pt oriented to person, hospital, general situation and month/year (although some generalized confusion noted at times during session; no family present to verify information; per prior therapy notes and per nursing pt uses hoyer lift).  Currently pt is max assist with logrolling L/R in bed and 2 assist to boost pt up in bed using bed pad.  Pt appearing with generalized weakness and decreased activity tolerance.  Pt would currently benefit from skilled PT to address noted impairments and functional limitations (see below for any additional details).  Upon hospital discharge, pt may benefit from ongoing therapy.     If plan is discharge home, recommend the following: Two people to help with walking and/or transfers;Two people to help with bathing/dressing/bathroom;Assistance with cooking/housework;Assist for transportation;Help with stairs or ramp for entrance   Can travel by private vehicle    No    Equipment Recommendations None recommended by PT  Recommendations for Other Services       Functional Status Assessment Patient has had a recent decline in their functional status and/or demonstrates limited ability to make significant improvements in function in a reasonable and predictable amount of time     Precautions / Restrictions Precautions Precautions: Fall Precaution Comments: PEG  tube; R IJ perm cath Restrictions Weight Bearing Restrictions Per Provider Order: No Other Position/Activity Restrictions: L AKA      Mobility  Bed Mobility Overal bed mobility: Needs Assistance Bed Mobility: Rolling Rolling: Max assist, Used rails         General bed mobility comments: logrolling L/R in bed using bed pad to assist; vc's for technique    Transfers                   General transfer comment: Deferred (pt uses hoyer lift)    Ambulation/Gait                  Stairs            Wheelchair Mobility     Tilt Bed    Modified Rankin (Stroke Patients Only)       Balance                                             Pertinent Vitals/Pain Pain Assessment Pain Assessment: Faces Faces Pain Scale: Hurts a little bit Pain Location: itchy skin all over Pain Descriptors / Indicators:  (Itchy) Pain Intervention(s): Limited activity within patient's tolerance, Monitored during session, Repositioned    Home Living Family/patient expects to be discharged to:: Private residence Living Arrangements: Spouse/significant other;Children Available Help at Discharge: Family;Available PRN/intermittently Type of Home: House Home Access: Ramped entrance       Home Layout: Two level;Able to live on main level with bedroom/bathroom Home Equipment: Wheelchair - power;Wheelchair - manual  Additional Comments: No family present to verify information    Prior Function Prior Level of Function : Needs assist (No family present to verify information)             Mobility Comments: Pt reports using hoyer lift to transfer to w/c every other day; pt reports having PT and OT at home 2x/week (PT working on LE ex's in bed; pt reports sitting on EOB a while ago with therapy but did not go well although pt did not elaborate); pt reports using power w/c; requires assist with bed mobility but helps as much as she can  Has aide 1x/week      Extremity/Trunk Assessment   Upper Extremity Assessment Upper Extremity Assessment: Generalized weakness    Lower Extremity Assessment Lower Extremity Assessment: Generalized weakness;LLE deficits/detail;RLE deficits/detail RLE Deficits / Details: at least 3/5 AROM hip flexion, knee flexion/extension, and DF; approximately 10-20 degrees short of knee extension; R DF close to neutral AROM LLE Deficits / Details: L AKA (at least 3/5 AROM hip flexion and hip abd/adduction)       Communication   Communication Communication: No apparent difficulties Cueing Techniques: Verbal cues  Cognition Arousal: Alert Behavior During Therapy: Anxious Overall Cognitive Status: No family/caregiver present to determine baseline cognitive functioning                                 General Comments: Oriented to person, hospital, general situation and month/year; generalized confused noted at times though        General Comments General comments (skin integrity, edema, etc.): PEG tube in place.  Nursing cleared pt for participation in physical therapy.  Pt agreeable to PT session.    Exercises     Assessment/Plan    PT Assessment Patient needs continued PT services  PT Problem List Decreased strength;Decreased activity tolerance;Decreased mobility       PT Treatment Interventions Functional mobility training;Therapeutic activities;Therapeutic exercise;Patient/family education    PT Goals (Current goals can be found in the Care Plan section)  Acute Rehab PT Goals Patient Stated Goal: to improve overall strength PT Goal Formulation: With patient Time For Goal Achievement: 06/01/23 Potential to Achieve Goals: Fair    Frequency Min 1X/week     Co-evaluation               AM-PAC PT "6 Clicks" Mobility  Outcome Measure Help needed turning from your back to your side while in a flat bed without using bedrails?: A Lot Help needed moving from lying on your back to  sitting on the side of a flat bed without using bedrails?: Total Help needed moving to and from a bed to a chair (including a wheelchair)?: Total Help needed standing up from a chair using your arms (e.g., wheelchair or bedside chair)?: Total Help needed to walk in hospital room?: Total Help needed climbing 3-5 steps with a railing? : Total 6 Click Score: 7    End of Session Equipment Utilized During Treatment: Oxygen (1 L via nasal cannula) Activity Tolerance: Patient tolerated treatment well Patient left: in bed;with call bell/phone within reach;with bed alarm set Nurse Communication: Mobility status;Precautions PT Visit Diagnosis: Other abnormalities of gait and mobility (R26.89);Muscle weakness (generalized) (M62.81)    Time: 1610-9604 PT Time Calculation (min) (ACUTE ONLY): 22 min   Charges:   PT Evaluation $PT Eval Low Complexity: 1 Low   PT General Charges $$ ACUTE PT VISIT: 1 Visit  Hendricks Limes, PT 05/18/23, 5:16 PM

## 2023-05-18 NOTE — Care Management Important Message (Signed)
Important Message  Patient Details  Name: Samon Dishner MRN: [id number] Date of Birth: [birth year]   Important Message Given:  Yes - Medicare IM     Sherilyn Banker 05/18/2023, 2:22 PM

## 2023-05-18 NOTE — Consult Note (Signed)
Pharmacy Antibiotic Note  Alexandra Foster is a 73 y.o. female admitted on 05/13/2023 with sepsis.  They presented with confusion and over the last three days intermittent vomiting. They've been treated with antibiotics for sepsis secondary to right upper lobe pneumonia. During hospitalization they were on 3 days of ceftriaxone and they last two days they're been on doxycycline. Today, 2/1 they had a rapid response in dialysis, they become less responsive and had SOB. Provider consulted pharmacy for Vancomycin dosing and to stop doxycycline. Also consulted pharmacy to dose Zosyn for aspiration pneumonia.  Today, 05/18/2023 Afebrile WBC 29.1 > 26.3  Plan: Vancomycin loading dose 1750 mg given 2/1 Continue vancomycin 750 mg on HD days (patient between 61 - 80 kg), dose due Tuesday Continue Zosyn 2.25 g IV q8h  Follow for LOT  Height: 5\' 4"  (162.6 cm) Weight: 80.1 kg (176 lb 9.4 oz) IBW/kg (Calculated) : 54.7  Temp (24hrs), Avg:98.8 F (37.1 C), Min:98.3 F (36.8 C), Max:99.3 F (37.4 C)  Recent Labs  Lab 05/12/23 2345 05/13/23 0000 05/13/23 0224 05/13/23 0522 05/14/23 0847 05/15/23 0324 05/16/23 0414 05/17/23 0623  WBC  --   --   --  5.6 27.3* 27.5* 29.1* 26.3*  CREATININE 3.61*  --   --  3.95* 5.64*  --  4.70* 3.97*  LATICACIDVEN  --  2.3* 1.7  --   --   --   --   --     Estimated Creatinine Clearance: 12.7 mL/min (A) (by C-G formula based on SCr of 3.97 mg/dL (H)).    Allergies  Allergen Reactions   Chlorhexidine Dermatitis   Augmentin [Amoxicillin-Pot Clavulanate] Dermatitis   Antimicrobials this admission: Zithromax 1/29 - 1/31 Doxycycline 1/31 - 2/1  Ceftriaxone 1/29 - 1/31 Vancomycin 2/1 >> Zosyn 2/1 >>  Dose adjustments this admission: NA  Microbiology results: 1/29 BCx: NG x 5 days 1/29 UCx: Klebsiella pneumoniae  1/20 MRSA PCR: Detected  No new cultures collected to date  Thank you for allowing pharmacy to be a part of this patient's care.  Alexandra Talerico  Foster PharmD, BCPS 05/18/2023 9:14 AM

## 2023-05-18 NOTE — Progress Notes (Signed)
Progress Note   Patient: Alexandra Foster WUJ:[id number] DOB: 1950-04-25 DOA: 05/13/2023     5 DOS: the patient was seen and examined on 05/18/2023   Brief hospital course: 73 year old female with end-stage renal disease on hemodialysis, osteoarthritis, asthma, COPD, type 2 diabetes mellitus, GERD, hypertension and paroxysmal atrial fibrillation and obstructive sleep apnea on CPAP presented to the ER with confusion over the last 3 days with intermittent vomiting.  She was admitted with clinical sepsis secondary to right upper lobe pneumonia likely aspiration and urinary infection.  Started on antibiotic.  1/29.  Patient has some cough and shortness of breath.  Does not wear oxygen at home.  Blood pressure very high in the right arm and very low in the left arm.  CT angio of the left arm does not show any blockages.  May have a steal from the right arm with a catheter in the right chest.  Feeding tube confirmed then placed last night and can restart tube feeds.  Had a fever of 103. 1/30.  Patient asking for water.  Patient gets all of her nutrition through her tube. 1/31.  Patient complains of dry mouth. 2/1.  Called to rapid response at dialysis.  Patient had large brown BM then was less responsive and has shortness of breath.  Bipap ordered.  ABG.  Iv albumin.  Spoke with nephrology to remove fluid.  CT scan showing worsening pneumonia.  Antibiotics switched to Zosyn and vancomycin 2/2.  Respiratory status much improved from yesterday.  Off BiPAP and on 3 L of oxygen.  Will try to taper oxygen.  Blood pressure on the lower side will increase midodrine to 15 mg 3 times daily. 2/3.  This morning was down to 1 L asked nursing staff to try to taper off.  As needed Imodium for diarrhea.   Assessment and Plan: * Severe sepsis (HCC) Present on admission with leukocytosis fever tachycardia and tachypnea and acute respiratory failure.  Patient has right upper lobe pneumonia and Klebsiella growing out of  urine culture.  White count still very elevated.  Antibiotics switched on 2/1 to vancomycin and Zosyn.  Acute hypoxic respiratory failure (HCC) With respiratory distress on 2/1 was placed on BiPAP.  This morning on 1 L and hopefully can taper off.  Hypotension On midodrine and florinef for chronically low blood pressure.  Increased midodrine to 15 mg 3 times daily.  Aspiration pneumonia (HCC) Multifocal pneumonia seen on CT scan.  On vancomycin and Zosyn.  Acute metabolic encephalopathy Mental status improved.  ESRD on hemodialysis [facility]) Dialysis on Saturday  Dyslipidemia On Lipitor  Acute on chronic diastolic CHF (congestive heart failure) (HCC) Likely more worsening pneumonia rather than heart failure.  Unable to remove much fluid with dialysis secondary to hypotension.  Hyperkalemia Improved with dialysis  Urinary tract infection due to Klebsiella species Treated with antibiotics  Itching On atarax standing dose and as needed Benadryl  Stage II decubitus ulcer (HCC) Buttocks, see full description below, documented on 1/30.  Diarrhea As needed Imodium and standing dose cholestyramine.  Obesity (BMI 30-39.9) BMI 30.31  Anxiety and depression On Klonopin and Lexapro.        Subjective: Patient feels okay.  When I was in there seeing her this morning she did have an episode of diarrhea.  Physical Exam: Vitals:   05/17/23 2030 05/18/23 0417 05/18/23 0500 05/18/23 1021  BP: (!) 101/53 (!) 107/52  (!) 90/46  Pulse: 80 65  68  Resp: 16  Temp: 99.3 F (37.4 C) 98.3 F (36.8 C)  (!) 97.5 F (36.4 C)  TempSrc: Axillary     SpO2: 100% 100%  98%  Weight:   80.1 kg   Height:       Physical Exam HENT:     Head: Normocephalic.  Eyes:     General: Lids are normal.     Conjunctiva/sclera: Conjunctivae normal.  Cardiovascular:     Rate and Rhythm: Normal rate and regular rhythm.     Heart sounds: Normal heart sounds, S1 normal and S2 normal.  Pulmonary:      Effort: No accessory muscle usage.     Breath sounds: Examination of the right-lower field reveals decreased breath sounds. Examination of the left-lower field reveals decreased breath sounds. Decreased breath sounds present. No wheezing, rhonchi or rales.  Abdominal:     Palpations: Abdomen is soft.     Tenderness: There is no abdominal tenderness.  Musculoskeletal:     Right lower leg: No swelling.     Left Lower Extremity: Left leg is amputated above knee.  Skin:    General: Skin is warm.     Findings: No rash.  Neurological:     Mental Status: She is alert.     Data Reviewed: No new data today  Family Communication: Updated daughter on the phone  Disposition: Status is: Inpatient Remains inpatient appropriate because: Continue IV antibiotics today.  Recheck white count tomorrow.  Patient had quite a bit of diarrhea yesterday.  As needed Imodium  Planned Discharge Destination: Will get PT and OT consultations.  Family to decide on whether to go home with home health for rehab.    Time spent: 28 minutes  Author: Alford Highland, MD 05/18/2023 3:27 PM  For on call review [email or web address].

## 2023-05-18 NOTE — Progress Notes (Signed)
[facility]  ROUNDING NOTE   Subjective:   Alexandra Foster is a 73 y.o. female with history of hypertension, coronary artery disease, congestive heart failure, COPD, cirrhosis, diabetes, status post left AKA, status post G-tube placement, and end stage renal disease on hemodialysis.  Patient presents to ED from dialysis at the request of family due to altered mental status.  Patient has been admitted for Lower urinary tract infectious disease [N39.0] Altered mental status, unspecified altered mental status type [R41.82] Pneumonia due to infectious organism, unspecified laterality, unspecified part of lung [J18.9] Sepsis due to gram-negative UTI (HCC) [A41.50, N39.0]  Patient is known to our practice from previous admissions and receives outpatient dialysis treatments at [facility] on a TTS schedule, supervised by [facility].    Update Patient seen resting in bed Family at bedside, applying coconut oil to patient's skin Patient continues to complain of itching  Tube feeds at 45 mL/h   Objective:  Vital signs in last 24 hours:  Temp:  [97.5 F (36.4 C)-99.3 F (37.4 C)] 97.5 F (36.4 C) (02/03 1021) Pulse Rate:  [65-80] 68 (02/03 1021) Resp:  [16] 16 (02/02 2030) BP: (90-107)/(46-53) 90/46 (02/03 1021) SpO2:  [98 %-100 %] 98 % (02/03 1021) Weight:  [80.1 kg] 80.1 kg (02/03 0500)  Weight change: 3.9 kg Filed Weights   05/16/23 1400 05/17/23 0618 05/18/23 0500  Weight: 76.4 kg 78.7 kg 80.1 kg    Intake/Output: I/O last 3 completed shifts: In: 1744.5 [NG/GT:1597.5; IV Piggyback:147] Out: -    Intake/Output this shift:  No intake/output data recorded.  Physical Exam: General: NAD  Head: Normocephalic, atraumatic. Moist oral mucosal membranes  Eyes: Anicteric  Lungs:  Clear to auscultation, normal effort  Heart: Regular rate and rhythm  Abdomen:  Soft, nontender, obese  Extremities:  trace peripheral edema.  Neurologic: Alert and  oriented to self, moving all four extremities  Skin: No lesions  Access: Rt chest permcath     Basic Metabolic Panel: Recent Labs  Lab 05/12/23 2345 05/13/23 0522 05/14/23 0847 05/16/23 0414 05/17/23 0623  NA 136 135 134* 137 135  K 4.7 4.1 5.2* 3.4* 3.2*  CL 94* 103 101 101 98  CO2 21* 20* 18* 23 24  GLUCOSE 142* 123* 137* 126* 95  BUN 26* 30* 51* 48* 43*  CREATININE 3.61* 3.95* 5.64* 4.70* 3.97*  CALCIUM 9.5 8.6* 8.9 9.2 8.7*  PHOS  --   --  4.0  --   --     Liver Function Tests: Recent Labs  Lab 05/12/23 2345 05/14/23 0847  AST 81*  --   ALT 64*  --   ALKPHOS 190*  --   BILITOT 0.9  --   PROT 8.9*  --   ALBUMIN 3.2* 2.2*   No results for input(s): "LIPASE", "AMYLASE" in the last 168 hours. No results for input(s): "AMMONIA" in the last 168 hours.  CBC: Recent Labs  Lab 05/13/23 0522 05/14/23 0847 05/15/23 0324 05/16/23 0414 05/17/23 0623  WBC 5.6 27.3* 27.5* 29.1* 26.3*  NEUTROABS  --   --  19.4* 17.2*  --   HGB 13.8 13.2 11.7* 10.9* 9.9*  HCT 46.0 42.1 36.0 35.0* 30.8*  MCV 86.6 82.5 82.2 83.3 81.7  PLT 332 432* 270 296 176    Cardiac Enzymes: No results for input(s): "CKTOTAL", "CKMB", "CKMBINDEX", "TROPONINI" in the last 168 hours.  BNP: Invalid input(s): "POCBNP"  CBG: Recent Labs  Lab 05/15/23 0027 05/15/23 0358 05/15/23 1938 05/15/23 2358 05/16/23  0443  GLUCAP 126* 149* 141* 110* 133*    Microbiology: Results for orders placed or performed during the hospital encounter of 05/13/23  Resp panel by RT-PCR (RSV, Flu A&B, Covid) Anterior Nasal Swab     Status: None   Collection Time: 05/12/23  4:37 PM   Specimen: Anterior Nasal Swab  Result Value Ref Range Status   SARS Coronavirus 2 by RT PCR NEGATIVE NEGATIVE Final    Comment: (NOTE) SARS-CoV-2 target nucleic acids are NOT DETECTED.  The SARS-CoV-2 RNA is generally detectable in upper respiratory specimens during the acute phase of infection. The lowest concentration of  SARS-CoV-2 viral copies this assay can detect is 138 copies/mL. A negative result does not preclude SARS-Cov-2 infection and should not be used as the sole basis for treatment or other patient management decisions. A negative result may occur with  improper specimen collection/handling, submission of specimen other than nasopharyngeal swab, presence of viral mutation(s) within the areas targeted by this assay, and inadequate number of viral copies(<138 copies/mL). A negative result must be combined with clinical observations, patient history, and epidemiological information. The expected result is Negative.  Fact Sheet for Patients:  BloggerCourse.com  Fact Sheet for Healthcare Providers:  SeriousBroker.it  This test is no t yet approved or cleared by the [place] FDA and  has been authorized for detection and/or diagnosis of SARS-CoV-2 by FDA under an Emergency Use Authorization (EUA). This EUA will remain  in effect (meaning this test can be used) for the duration of the COVID-19 declaration under Section 564(b)(1) of the Act, 21 U.S.C.section 360bbb-3(b)(1), unless the authorization is terminated  or revoked sooner.       Influenza A by PCR NEGATIVE NEGATIVE Final   Influenza B by PCR NEGATIVE NEGATIVE Final    Comment: (NOTE) The Xpert Xpress SARS-CoV-2/FLU/RSV plus assay is intended as an aid in the diagnosis of influenza from Nasopharyngeal swab specimens and should not be used as a sole basis for treatment. Nasal washings and aspirates are unacceptable for Xpert Xpress SARS-CoV-2/FLU/RSV testing.  Fact Sheet for Patients: BloggerCourse.com  Fact Sheet for Healthcare Providers: SeriousBroker.it  This test is not yet approved or cleared by the [place] FDA and has been authorized for detection and/or diagnosis of SARS-CoV-2 by FDA under an Emergency Use  Authorization (EUA). This EUA will remain in effect (meaning this test can be used) for the duration of the COVID-19 declaration under Section 564(b)(1) of the Act, 21 U.S.C. section 360bbb-3(b)(1), unless the authorization is terminated or revoked.     Resp Syncytial Virus by PCR NEGATIVE NEGATIVE Final    Comment: (NOTE) Fact Sheet for Patients: BloggerCourse.com  Fact Sheet for Healthcare Providers: SeriousBroker.it  This test is not yet approved or cleared by the [place] FDA and has been authorized for detection and/or diagnosis of SARS-CoV-2 by FDA under an Emergency Use Authorization (EUA). This EUA will remain in effect (meaning this test can be used) for the duration of the COVID-19 declaration under Section 564(b)(1) of the Act, 21 U.S.C. section 360bbb-3(b)(1), unless the authorization is terminated or revoked.  Performed at [facility], [address] [place], [place] [address]   Culture, blood (routine x 2)     Status: None   Collection Time: 05/12/23 11:45 PM   Specimen: BLOOD  Result Value Ref Range Status   Specimen Description BLOOD RIGHT ANTECUBITAL  Final   Special Requests   Final    BOTTLES DRAWN AEROBIC AND ANAEROBIC Blood Culture adequate  volume   Culture   Final    NO GROWTH 5 DAYS Performed at [facility], [address] [place], [place] [address]    Report Status 05/18/2023 FINAL  Final  Culture, blood (routine x 2)     Status: None   Collection Time: 05/13/23 12:00 AM   Specimen: BLOOD  Result Value Ref Range Status   Specimen Description BLOOD LEFT WRIST  Final   Special Requests   Final    AEROBIC BOTTLE ONLY Blood Culture results may not be optimal due to an inadequate volume of blood received in culture bottles   Culture   Final    NO GROWTH 5 DAYS Performed at [facility], [address], [place], [place] [address]    Report Status 05/18/2023 FINAL   Final  Urine Culture (for pregnant, neutropenic or urologic patients or patients with an indwelling urinary catheter)     Status: Abnormal   Collection Time: 05/13/23 12:10 AM   Specimen: Urine, Clean Catch  Result Value Ref Range Status   Specimen Description   Final    URINE, CLEAN CATCH Performed at [facility], [address], [place], [place] [address]    Special Requests   Final    NONE Performed at [facility], [address] [place], [place] [address]    Culture >=100,000 COLONIES/mL KLEBSIELLA PNEUMONIAE (A)  Final   Report Status 05/15/2023 FINAL  Final   Organism ID, Bacteria KLEBSIELLA PNEUMONIAE (A)  Final      Susceptibility   Klebsiella pneumoniae - MIC*    AMPICILLIN RESISTANT Resistant     CEFAZOLIN <=4 SENSITIVE Sensitive     CEFEPIME <=0.12 SENSITIVE Sensitive     CEFTRIAXONE <=0.25 SENSITIVE Sensitive     CIPROFLOXACIN <=0.25 SENSITIVE Sensitive     GENTAMICIN <=1 SENSITIVE Sensitive     IMIPENEM <=0.25 SENSITIVE Sensitive     NITROFURANTOIN <=16 SENSITIVE Sensitive     TRIMETH/SULFA <=20 SENSITIVE Sensitive     AMPICILLIN/SULBACTAM 4 SENSITIVE Sensitive     PIP/TAZO <=4 SENSITIVE Sensitive ug/mL    * >=100,000 COLONIES/mL KLEBSIELLA PNEUMONIAE  MRSA Next Gen by PCR, Nasal     Status: Abnormal   Collection Time: 05/14/23  4:30 PM   Specimen: Nasal Mucosa; Nasal Swab  Result Value Ref Range Status   MRSA by PCR Next Gen DETECTED (A) NOT DETECTED Final    Comment: RESULT CALLED TO, READ BACK BY AND VERIFIED WITH: DORATHY MUHORO @1908  ON 05/14/23 SKL (NOTE) The GeneXpert MRSA Assay (FDA approved for NASAL specimens only), is one component of a comprehensive MRSA colonization surveillance program. It is not intended to diagnose MRSA infection nor to guide or monitor treatment for MRSA infections. Test performance is not FDA approved in patients less than 49 years old. Performed at [facility], [address]  [place], [place] [address]     Coagulation Studies: No results for input(s): "LABPROT", "INR" in the last 72 hours.   Urinalysis: No results for input(s): "COLORURINE", "LABSPEC", "PHURINE", "GLUCOSEU", "HGBUR", "BILIRUBINUR", "KETONESUR", "PROTEINUR", "UROBILINOGEN", "NITRITE", "LEUKOCYTESUR" in the last 72 hours.  Invalid input(s): "APPERANCEUR"     Imaging: CT Angio Chest Pulmonary Embolism (PE) W or WO Contrast Result Date: 05/16/2023 CLINICAL DATA:  Shortness of breath. EXAM: CT ANGIOGRAPHY CHEST WITH CONTRAST TECHNIQUE: Multidetector CT imaging of the chest was performed using the standard protocol during bolus administration of intravenous contrast. Multiplanar CT image reconstructions and MIPs were obtained to evaluate the vascular anatomy.  RADIATION DOSE REDUCTION: This exam was performed according to the departmental dose-optimization program which includes automated exposure control, adjustment of the mA and/or kV according to patient size and/or use of iterative reconstruction technique. CONTRAST:  75mL OMNIPAQUE IOHEXOL 350 MG/ML SOLN COMPARISON:  February 27, 2023. FINDINGS: Cardiovascular: Satisfactory opacification of the pulmonary arteries to the segmental level. No evidence of pulmonary embolism. Normal heart size. No pericardial effusion. Mediastinum/Nodes: No enlarged mediastinal, hilar, or axillary lymph nodes. Thyroid gland, trachea, and esophagus demonstrate no significant findings. Lungs/Pleura: No pneumothorax or pleural effusion is noted. Significantly increased patchy airspace opacities are noted throughout the right lung and to a lesser degree in the left lung most consistent with worsening multifocal pneumonia. Upper Abdomen: Hepatic cirrhosis. Musculoskeletal: No chest wall abnormality. No acute or significant osseous findings. Review of the MIP images confirms the above findings. IMPRESSION: No definite evidence of pulmonary embolus. Significant increased patchy airspace  opacities are noted throughout both lungs, right much greater than left, most consistent with worsening multifocal pneumonia. Hepatic cirrhosis. Electronically Signed   By: Lupita Raider M.D.   On: 05/16/2023 17:08     Medications:    feeding supplement (NEPRO CARB STEADY) 45 mL/hr at 05/17/23 1600   piperacillin-tazobactam (ZOSYN)  IV 2.25 g (05/18/23 0959)   [START ON 05/19/2023] vancomycin      ascorbic acid  250 mg Oral Daily   atorvastatin  20 mg Oral Daily   vitamin D3  2,000 Units Oral Daily   cholestyramine  4 g Oral TID   escitalopram  10 mg Oral Daily   famotidine  10 mg Oral Daily   fludrocortisone  0.1 mg Oral Daily   free water  30 mL Per Tube Q4H   heparin injection (subcutaneous)  5,000 Units Subcutaneous Q8H   hydrOXYzine  50 mg Oral TID   metoCLOPramide  5 mg Oral TID AC & HS   midodrine  15 mg Oral TID with meals   multivitamin  1 tablet Oral QHS   mupirocin ointment  1 Application Nasal BID   nutrition supplement (JUVEN)  1 packet Oral BID BM   nystatin   Topical TID   mouth rinse  15 mL Mouth Rinse 4 times per day   acetaminophen **OR** acetaminophen, chlorpheniramine-HYDROcodone, diphenhydrAMINE, ipratropium-albuterol, liver oil-zinc oxide, loperamide HCl, LORazepam, magnesium hydroxide, ondansetron **OR** ondansetron (ZOFRAN) IV, mouth rinse, traZODone  Assessment/ Plan:  Alexandra Foster is a 73 y.o.  female with history of hypertension, coronary artery disease, congestive heart failure, COPD, cirrhosis, diabetes, status post left AKA, status post G-tube placement, and end stage renal disease on hemodialysis.  Patient presents to ED from dialysis at the request of family due to altered mental status.  Patient has been admitted for Lower urinary tract infectious disease [N39.0] Altered mental status, unspecified altered mental status type [R41.82] Pneumonia due to infectious organism, unspecified laterality, unspecified part of lung [J18.9] Sepsis due to  gram-negative UTI (HCC) [A41.50, N39.0]  [facility]/TTs/Rt permcath/75.6kg  End-stage renal disease: Next treatment scheduled for Tuesday.  #2.  Sepsis, gram negative UTI. Received Lactated ringers in ED. Urine culture with Klebsiella.  Patient currently receiving Zosyn and vancomycin.  #3: Anemia of chronic kidney disease Lab Results  Component Value Date   HGB 9.9 (L) 05/17/2023    Hgb at desired goal.  No need for ESA's at this time.   #4: Secondary Hyperparathyroidism: with outpatient labs: PTH 35, phosphorus 7.6, calcium 9.4 on 04/21/23.   Lab Results  Component Value Date   CALCIUM 8.7 (L) 05/17/2023   PHOS 4.0 05/14/2023    Calcium within optimal range.    LOS: 5 Zaara Sprowl 2/3/20252:53 PM

## 2023-05-18 NOTE — Plan of Care (Signed)

## 2023-05-18 NOTE — Progress Notes (Signed)
Instilled cathflo to both HD ports .

## 2023-05-18 NOTE — Assessment & Plan Note (Signed)
As needed Imodium and standing dose cholestyramine.

## 2023-05-19 DIAGNOSIS — I9589 Other hypotension: Secondary | ICD-10-CM | POA: Diagnosis not present

## 2023-05-19 DIAGNOSIS — N390 Urinary tract infection, site not specified: Secondary | ICD-10-CM | POA: Diagnosis not present

## 2023-05-19 DIAGNOSIS — A419 Sepsis, unspecified organism: Secondary | ICD-10-CM | POA: Diagnosis not present

## 2023-05-19 DIAGNOSIS — J9601 Acute respiratory failure with hypoxia: Secondary | ICD-10-CM | POA: Diagnosis not present

## 2023-05-19 LAB — BASIC METABOLIC PANEL
Anion gap: 17 — ABNORMAL HIGH (ref 5–15)
BUN: 105 mg/dL — ABNORMAL HIGH (ref 8–23)
CO2: 19 mmol/L — ABNORMAL LOW (ref 22–32)
Calcium: 9.3 mg/dL (ref 8.9–10.3)
Chloride: 97 mmol/L — ABNORMAL LOW (ref 98–111)
Creatinine, Ser: 5.87 mg/dL — ABNORMAL HIGH (ref 0.44–1.00)
GFR, Estimated: 7 mL/min — ABNORMAL LOW (ref >60)
Glucose, Bld: 122 mg/dL — ABNORMAL HIGH (ref 70–99)
Potassium: 3.7 mmol/L (ref 3.5–5.1)
Sodium: 133 mmol/L — ABNORMAL LOW (ref 135–145)

## 2023-05-19 LAB — CBC
HCT: 29.9 % — ABNORMAL LOW (ref 36.0–46.0)
Hemoglobin: 9.7 g/dL — ABNORMAL LOW (ref 12.0–15.0)
MCH: 26.4 pg (ref 26.0–34.0)
MCHC: 32.4 g/dL (ref 30.0–36.0)
MCV: 81.3 fL (ref 80.0–100.0)
Platelets: 324 10*3/uL (ref 150–400)
RBC: 3.68 MIL/uL — ABNORMAL LOW (ref 3.87–5.11)
RDW: 15.5 % (ref 11.5–15.5)
WBC: 30.1 10*3/uL — ABNORMAL HIGH (ref 4.0–10.5)
nRBC: 0.3 % — ABNORMAL HIGH (ref 0.0–0.2)

## 2023-05-19 LAB — VANCOMYCIN, TROUGH: Vancomycin Tr: 35 ug/mL (ref 15–20)

## 2023-05-19 LAB — ALBUMIN: Albumin: 2.2 g/dL — ABNORMAL LOW (ref 3.5–5.0)

## 2023-05-19 MED ORDER — EPOETIN ALFA-EPBX 4000 UNIT/ML IJ SOLN
4000.0000 [IU] | INTRAMUSCULAR | Status: DC
  Administered 2023-05-19 – 2023-05-26 (×4): 4000 [IU] via INTRAVENOUS
  Filled 2023-05-19 (×3): qty 1

## 2023-05-19 MED ORDER — HEPARIN SODIUM (PORCINE) 1000 UNIT/ML IJ SOLN
INTRAMUSCULAR | Status: AC
  Filled 2023-05-19: qty 10

## 2023-05-19 MED ORDER — VANCOMYCIN HCL 750 MG IV SOLR
750.0000 mg | INTRAVENOUS | Status: DC
  Filled 2023-05-19: qty 15

## 2023-05-19 MED ORDER — CHLORHEXIDINE GLUCONATE CLOTH 2 % EX PADS
6.0000 | MEDICATED_PAD | Freq: Every day | CUTANEOUS | Status: DC
Start: 2023-05-19 — End: 2023-05-19

## 2023-05-19 MED ORDER — EPOETIN ALFA-EPBX 4000 UNIT/ML IJ SOLN
INTRAMUSCULAR | Status: AC
  Filled 2023-05-19: qty 1

## 2023-05-19 NOTE — Progress Notes (Signed)
 Hemodialysis Note:  Received patient in bed to unit. Oriented to person and place Verbal consent given by her daughter through phone  Treatment initiated: 0900 Treatment completed: 1300  Access used: Right internal jugular catheter Access issues: None  Patient tolerated well. Transported back to room, alert without acute distress. Report given to patient's RN.  Total UF removed: 0.5 Liter Medications given: Retacrit  4000 units IV  Post HD weight: 78 Kg  [facility]

## 2023-05-19 NOTE — Progress Notes (Signed)
 [facility]  ROUNDING NOTE   Subjective:   Alexandra Foster is a 73 y.o. female with history of hypertension, coronary artery disease, congestive heart failure, COPD, cirrhosis, diabetes, status post left AKA, status post G-tube placement, and end stage renal disease on hemodialysis.  Patient presents to ED from dialysis at the request of family due to altered mental status.  Patient has been admitted for Lower urinary tract infectious disease [N39.0] Altered mental status, unspecified altered mental status type [R41.82] Pneumonia due to infectious organism, unspecified laterality, unspecified part of lung [J18.9] Sepsis due to gram-negative UTI (HCC) [A41.50, N39.0]  Patient is known to our practice from previous admissions and receives outpatient dialysis treatments at [facility] on a TTS schedule, supervised by [facility].    Update Patient seen and evaluated during dialysis   HEMODIALYSIS FLOWSHEET:  Blood Flow Rate (mL/min): 299 mL/min Arterial Pressure (mmHg): -111.11 mmHg Venous Pressure (mmHg): 111.91 mmHg TMP (mmHg): 0.4 mmHg Ultrafiltration Rate (mL/min): 0 mL/min Dialysate Flow Rate (mL/min): 299 ml/min Dialysis Fluid Bolus: Normal Saline  Continues to complain of itching   Objective:  Vital signs in last 24 hours:  Temp:  [98.2 F (36.8 C)-99.5 F (37.5 C)] 99.5 F (37.5 C) (02/04 0832) Pulse Rate:  [61-77] 77 (02/04 1030) Resp:  [20-24] 24 (02/04 1030) BP: (85-121)/(46-58) 104/57 (02/04 1030) SpO2:  [96 %-100 %] 97 % (02/04 1030) Weight:  [78.5 kg-80.6 kg] 78.5 kg (02/04 0832)  Weight change: 0.5 kg Filed Weights   05/18/23 0500 05/19/23 0520 05/19/23 0832  Weight: 80.1 kg 80.6 kg 78.5 kg    Intake/Output: I/O last 3 completed shifts: In: 2223.5 [NG/GT:2023.5; IV Piggyback:200] Out: -    Intake/Output this shift:  No intake/output data recorded.  Physical Exam: General: NAD  Head: Normocephalic, atraumatic. Moist  oral mucosal membranes  Eyes: Anicteric  Lungs:  Clear to auscultation, normal effort  Heart: Regular rate and rhythm  Abdomen:  Soft, nontender, obese  Extremities:  trace peripheral edema.  Neurologic: Alert and oriented to self, moving all four extremities  Skin: No lesions, dry  Access: Rt chest permcath     Basic Metabolic Panel: Recent Labs  Lab 05/13/23 0522 05/14/23 0847 05/16/23 0414 05/17/23 0623 05/19/23 0653  NA 135 134* 137 135 133*  K 4.1 5.2* 3.4* 3.2* 3.7  CL 103 101 101 98 97*  CO2 20* 18* 23 24 19*  GLUCOSE 123* 137* 126* 95 122*  BUN 30* 51* 48* 43* 105*  CREATININE 3.95* 5.64* 4.70* 3.97* 5.87*  CALCIUM  8.6* 8.9 9.2 8.7* 9.3  PHOS  --  4.0  --   --   --     Liver Function Tests: Recent Labs  Lab 05/12/23 2345 05/14/23 0847 05/19/23 0805  AST 81*  --   --   ALT 64*  --   --   ALKPHOS 190*  --   --   BILITOT 0.9  --   --   PROT 8.9*  --   --   ALBUMIN  3.2* 2.2* 2.2*   No results for input(s): LIPASE, AMYLASE in the last 168 hours. No results for input(s): AMMONIA in the last 168 hours.  CBC: Recent Labs  Lab 05/14/23 0847 05/15/23 0324 05/16/23 0414 05/17/23 0623 05/19/23 0653  WBC 27.3* 27.5* 29.1* 26.3* 30.1*  NEUTROABS  --  19.4* 17.2*  --   --   HGB 13.2 11.7* 10.9* 9.9* 9.7*  HCT 42.1 36.0 35.0* 30.8* 29.9*  MCV 82.5 82.2 83.3  81.7 81.3  PLT 432* 270 296 176 324    Cardiac Enzymes: No results for input(s): CKTOTAL, CKMB, CKMBINDEX, TROPONINI in the last 168 hours.  BNP: Invalid input(s): POCBNP  CBG: Recent Labs  Lab 05/15/23 0027 05/15/23 0358 05/15/23 1938 05/15/23 2358 05/16/23 0443  GLUCAP 126* 149* 141* 110* 133*    Microbiology: Results for orders placed or performed during the hospital encounter of 05/13/23  Resp panel by RT-PCR (RSV, Flu A&B, Covid) Anterior Nasal Swab     Status: None   Collection Time: 05/12/23  4:37 PM   Specimen: Anterior Nasal Swab  Result Value Ref Range Status    SARS Coronavirus 2 by RT PCR NEGATIVE NEGATIVE Final    Comment: (NOTE) SARS-CoV-2 target nucleic acids are NOT DETECTED.  The SARS-CoV-2 RNA is generally detectable in upper respiratory specimens during the acute phase of infection. The lowest concentration of SARS-CoV-2 viral copies this assay can detect is 138 copies/mL. A negative result does not preclude SARS-Cov-2 infection and should not be used as the sole basis for treatment or other patient management decisions. A negative result may occur with  improper specimen collection/handling, submission of specimen other than nasopharyngeal swab, presence of viral mutation(s) within the areas targeted by this assay, and inadequate number of viral copies(<138 copies/mL). A negative result must be combined with clinical observations, patient history, and epidemiological information. The expected result is Negative.  Fact Sheet for Patients:  bloggercourse.com  Fact Sheet for Healthcare Providers:  seriousbroker.it  This test is no t yet approved or cleared by the [place]  FDA and  has been authorized for detection and/or diagnosis of SARS-CoV-2 by FDA under an Emergency Use Authorization (EUA). This EUA will remain  in effect (meaning this test can be used) for the duration of the COVID-19 declaration under Section 564(b)(1) of the Act, 21 U.S.C.section 360bbb-3(b)(1), unless the authorization is terminated  or revoked sooner.       Influenza A by PCR NEGATIVE NEGATIVE Final   Influenza B by PCR NEGATIVE NEGATIVE Final    Comment: (NOTE) The Xpert Xpress SARS-CoV-2/FLU/RSV plus assay is intended as an aid in the diagnosis of influenza from Nasopharyngeal swab specimens and should not be used as a sole basis for treatment. Nasal washings and aspirates are unacceptable for Xpert Xpress SARS-CoV-2/FLU/RSV testing.  Fact Sheet for  Patients: bloggercourse.com  Fact Sheet for Healthcare Providers: seriousbroker.it  This test is not yet approved or cleared by the [place]  FDA and has been authorized for detection and/or diagnosis of SARS-CoV-2 by FDA under an Emergency Use Authorization (EUA). This EUA will remain in effect (meaning this test can be used) for the duration of the COVID-19 declaration under Section 564(b)(1) of the Act, 21 U.S.C. section 360bbb-3(b)(1), unless the authorization is terminated or revoked.     Resp Syncytial Virus by PCR NEGATIVE NEGATIVE Final    Comment: (NOTE) Fact Sheet for Patients: bloggercourse.com  Fact Sheet for Healthcare Providers: seriousbroker.it  This test is not yet approved or cleared by the [place]  FDA and has been authorized for detection and/or diagnosis of SARS-CoV-2 by FDA under an Emergency Use Authorization (EUA). This EUA will remain in effect (meaning this test can be used) for the duration of the COVID-19 declaration under Section 564(b)(1) of the Act, 21 U.S.C. section 360bbb-3(b)(1), unless the authorization is terminated or revoked.  Performed at [facility], [address], [place], [place] [address]   Culture, blood (routine x 2)  Status: None   Collection Time: 05/12/23 11:45 PM   Specimen: BLOOD  Result Value Ref Range Status   Specimen Description BLOOD RIGHT ANTECUBITAL  Final   Special Requests   Final    BOTTLES DRAWN AEROBIC AND ANAEROBIC Blood Culture adequate volume   Culture   Final    NO GROWTH 5 DAYS Performed at [facility], [address] [place], [place] [address]    Report Status 05/18/2023 FINAL  Final  Culture, blood (routine x 2)     Status: None   Collection Time: 05/13/23 12:00 AM   Specimen: BLOOD  Result Value Ref Range Status   Specimen Description BLOOD LEFT WRIST  Final    Special Requests   Final    AEROBIC BOTTLE ONLY Blood Culture results may not be optimal due to an inadequate volume of blood received in culture bottles   Culture   Final    NO GROWTH 5 DAYS Performed at [facility], [address], [place], [place] [address]    Report Status 05/18/2023 FINAL  Final  Urine Culture (for pregnant, neutropenic or urologic patients or patients with an indwelling urinary catheter)     Status: Abnormal   Collection Time: 05/13/23 12:10 AM   Specimen: Urine, Clean Catch  Result Value Ref Range Status   Specimen Description   Final    URINE, CLEAN CATCH Performed at [facility], [address] [place], [place] [address]    Special Requests   Final    NONE Performed at [facility], [address], [place], [place] [address]    Culture >=100,000 COLONIES/mL KLEBSIELLA PNEUMONIAE (A)  Final   Report Status 05/15/2023 FINAL  Final   Organism ID, Bacteria KLEBSIELLA PNEUMONIAE (A)  Final      Susceptibility   Klebsiella pneumoniae - MIC*    AMPICILLIN  RESISTANT Resistant     CEFAZOLIN  <=4 SENSITIVE Sensitive     CEFEPIME  <=0.12 SENSITIVE Sensitive     CEFTRIAXONE  <=0.25 SENSITIVE Sensitive     CIPROFLOXACIN <=0.25 SENSITIVE Sensitive     GENTAMICIN  <=1 SENSITIVE Sensitive     IMIPENEM <=0.25 SENSITIVE Sensitive     NITROFURANTOIN <=16 SENSITIVE Sensitive     TRIMETH/SULFA <=20 SENSITIVE Sensitive     AMPICILLIN /SULBACTAM 4 SENSITIVE Sensitive     PIP/TAZO <=4 SENSITIVE Sensitive ug/mL    * >=100,000 COLONIES/mL KLEBSIELLA PNEUMONIAE  MRSA Next Gen by PCR, Nasal     Status: Abnormal   Collection Time: 05/14/23  4:30 PM   Specimen: Nasal Mucosa; Nasal Swab  Result Value Ref Range Status   MRSA by PCR Next Gen DETECTED (A) NOT DETECTED Final    Comment: RESULT CALLED TO, READ BACK BY AND VERIFIED WITH: DORATHY MUHORO @1908  ON 05/14/23 SKL (NOTE) The GeneXpert MRSA Assay (FDA approved for NASAL specimens only), is one  component of a comprehensive MRSA colonization surveillance program. It is not intended to diagnose MRSA infection nor to guide or monitor treatment for MRSA infections. Test performance is not FDA approved in patients less than 49 years old. Performed at [facility], [address] [place], [place] [address]     Coagulation Studies: No results for input(s): LABPROT, INR in the last 72 hours.   Urinalysis: No results for input(s): COLORURINE, LABSPEC, PHURINE, GLUCOSEU, HGBUR, BILIRUBINUR, KETONESUR, PROTEINUR, UROBILINOGEN, NITRITE, LEUKOCYTESUR in the last 72 hours.  Invalid input(s): APPERANCEUR     Imaging: No results found.    Medications:    feeding  supplement (NEPRO CARB STEADY) 45 mL/hr at 05/19/23 0458   piperacillin -tazobactam (ZOSYN )  IV 2.25 g (05/19/23 0130)    ascorbic acid   250 mg Oral Daily   atorvastatin   20 mg Oral Daily   vitamin D3  2,000 Units Oral Daily   cholestyramine   4 g Oral TID   escitalopram   10 mg Oral Daily   famotidine   10 mg Oral Daily   fludrocortisone   0.1 mg Oral Daily   free water   30 mL Per Tube Q4H   heparin  injection (subcutaneous)  5,000 Units Subcutaneous Q8H   hydrOXYzine   50 mg Oral TID   metoCLOPramide   5 mg Oral TID AC & HS   midodrine   15 mg Oral TID with meals   multivitamin  1 tablet Oral QHS   mupirocin  ointment  1 Application Nasal BID   nutrition supplement (JUVEN)  1 packet Oral BID BM   nystatin    Topical TID   mouth rinse  15 mL Mouth Rinse 4 times per day   acetaminophen  **OR** acetaminophen , chlorpheniramine-HYDROcodone, diphenhydrAMINE , ipratropium-albuterol , liver oil-zinc  oxide, loperamide  HCl, LORazepam , magnesium  hydroxide, ondansetron  **OR** ondansetron  (ZOFRAN ) IV, mouth rinse, traZODone   Assessment/ Plan:  Alexandra Foster is a 73 y.o.  female with history of hypertension, coronary artery disease, congestive heart failure, COPD, cirrhosis, diabetes, status  post left AKA, status post G-tube placement, and end stage renal disease on hemodialysis.  Patient presents to ED from dialysis at the request of family due to altered mental status.  Patient has been admitted for Lower urinary tract infectious disease [N39.0] Altered mental status, unspecified altered mental status type [R41.82] Pneumonia due to infectious organism, unspecified laterality, unspecified part of lung [J18.9] Sepsis due to gram-negative UTI (HCC) [A41.50, N39.0]  [facility]/TTs/Rt permcath/75.6kg  End-stage renal disease: Receiving dialysis today, UF 1L as tolerated. Next treatment scheduled for Thursday.   #2.  Sepsis, gram negative UTI. Received Lactated ringers  in ED. Urine culture with Klebsiella.  Patient currently receiving Zosyn  and vancomycin . White count remains elevated.   #3: Anemia of chronic kidney disease Lab Results  Component Value Date   HGB 9.7 (L) 05/19/2023    Hgb at desired goal.  Will order low dose EPO with dialysis.    #4: Secondary Hyperparathyroidism: with outpatient labs: PTH 35, phosphorus 7.6, calcium  9.4 on 04/21/23.   Lab Results  Component Value Date   CALCIUM  9.3 05/19/2023   PHOS 4.0 05/14/2023    Will continue to monitor bone minerals during this admission.     LOS: 6 Rosbel Buckner 2/4/202511:15 AM

## 2023-05-19 NOTE — Progress Notes (Addendum)
 OT Cancellation Note  Patient Details Name: Alexandra Foster MRN: [id number] DOB: [birth year]   Cancelled Treatment:    Reason Eval/Treat Not Completed: Patient at procedure or test/ unavailable. Pt noted to be off the floor for dialysis this morning, unavailable at this time. Will continue to follow POC at later date/time as pt available and medically stable. Pt asleep on entry to room with family member and nurse present. She had received meds that make her sleepy and it was recommended OT eval be completed tomorrow. Spoke with family member in room regarding PLOF being hoyer lift transfers to W/C. Total assist for ADLs and working with [facility] therapy to sit on EOB. Will re-attempt eval tomorrow when pt is awake/less drowsy.   Zykia Walla E Parley Pidcock 05/19/2023, 12:39 PM

## 2023-05-19 NOTE — Plan of Care (Signed)

## 2023-05-19 NOTE — Consult Note (Addendum)
 Pharmacy Antibiotic Note  Alexandra Foster is a 73 y.o. female admitted on 05/13/2023 with sepsis.  They presented with confusion and over the last three days intermittent vomiting. They've been treated with antibiotics for sepsis secondary to right upper lobe pneumonia. During hospitalization they were on 3 days of ceftriaxone  and they last two days they're been on doxycycline . Today, 2/1 they had a rapid response in dialysis, they become less responsive and had SOB. Provider consulted pharmacy for Vancomycin  dosing and to stop doxycycline . Also consulted pharmacy to dose Zosyn  for aspiration pneumonia.  Today, 05/19/2023 Afebrile WBC 29.1 > 26.3>30.1  Plan: Vancomycin  loading dose 1750 mg given 2/1. Random level prior to HD = 35 today 2/4 HOLD  vancomycin  750 mg today, reassess prior to HD On Thursday. Continue Zosyn  2.25 g IV q8h  Follow for LOT - 2/4 Will continue Abx for now per Dr Ginnie  Height: 5' 4 (162.6 cm) Weight: 78.5 kg (173 lb 1 oz) IBW/kg (Calculated) : 54.7  Temp (24hrs), Avg:98.7 F (37.1 C), Min:97.5 F (36.4 C), Max:99.5 F (37.5 C)  Recent Labs  Lab 05/13/23 0000 05/13/23 0224 05/13/23 0522 05/14/23 0847 05/15/23 0324 05/16/23 0414 05/17/23 0623 05/19/23 0653 05/19/23 0805  WBC  --   --  5.6 27.3* 27.5* 29.1* 26.3* 30.1*  --   CREATININE  --   --  3.95* 5.64*  --  4.70* 3.97* 5.87*  --   LATICACIDVEN 2.3* 1.7  --   --   --   --   --   --   --   VANCOTROUGH  --   --   --   --   --   --   --   --  35*    Estimated Creatinine Clearance: 8.5 mL/min (A) (by C-G formula based on SCr of 5.87 mg/dL (H)).    Allergies  Allergen Reactions   Chlorhexidine  Dermatitis   Augmentin  [Amoxicillin -Pot Clavulanate] Dermatitis   Antimicrobials this admission: Zithromax  1/29 - 1/31 Doxycycline  1/31 - 2/1  Ceftriaxone  1/29 - 1/31 Vancomycin  2/1 >> Zosyn  2/1 >>  Dose adjustments this admission: NA  Microbiology results: 1/29 BCx: NG x 5 days 1/29 UCx:  Klebsiella pneumoniae  1/20 MRSA PCR: Detected  No new cultures collected to date  Thank you for allowing pharmacy to be a part of this patient's care.  Jayvier Burgher Rodriguez-Guzman PharmD, BCPS 05/19/2023 9:07 AM

## 2023-05-19 NOTE — Progress Notes (Signed)
 Progress Note   Patient: Alexandra Foster FMW:[id number] DOB: 01-24-1951 DOA: 05/13/2023     6 DOS: the patient was seen and examined on 05/19/2023   Brief hospital course: 73 year old female with end-stage renal disease on hemodialysis, osteoarthritis, asthma, COPD, type 2 diabetes mellitus, GERD, hypertension and paroxysmal atrial fibrillation and obstructive sleep apnea on CPAP presented to the ER with confusion over the last 3 days with intermittent vomiting.  She was admitted with clinical sepsis secondary to right upper lobe pneumonia likely aspiration and urinary infection.  Started on antibiotic.  1/29.  Patient has some cough and shortness of breath.  Does not wear oxygen at home.  Blood pressure very high in the right arm and very low in the left arm.  CT angio of the left arm does not show any blockages.  May have a steal from the right arm with a catheter in the right chest.  Feeding tube confirmed then placed last night and can restart tube feeds.  Had a fever of 103. 1/30.  Patient asking for water .  Patient gets all of her nutrition through her tube. 1/31.  Patient complains of dry mouth. 2/1.  Called to rapid response at dialysis.  Patient had large brown BM then was less responsive and has shortness of breath.  Bipap ordered.  ABG.  Iv albumin .  Spoke with nephrology to remove fluid.  CT scan showing worsening pneumonia.  Antibiotics switched to Zosyn  and vancomycin  2/2.  Respiratory status much improved from yesterday.  Off BiPAP and on 3 L of oxygen.  Will try to taper oxygen.  Blood pressure on the lower side will increase midodrine  to 15 mg 3 times daily. 2/3.  This morning was down to 1 L asked nursing staff to try to taper off.  As needed Imodium  for diarrhea.  Steroids discontinued. 2/4.  White blood cell count 30.1.  Diarrhea not documented.    Assessment and Plan: * Severe sepsis (HCC) Present on admission with leukocytosis fever tachycardia and tachypnea and acute  respiratory failure.  Patient has right upper lobe pneumonia and Klebsiella growing out of urine culture.  White count still very elevated at 30.1.[name]  Antibiotics switched on 2/1 to vancomycin  and Zosyn .  Continue these antibiotics for now.  Steroids discontinued after dose on 2/3.  Acute hypoxic respiratory failure (HCC) With respiratory distress on 2/1 was placed on BiPAP.  Last pulse ox on room air.  This problem has resolved.  Hypotension On midodrine  and florinef  for chronically low blood pressure.  Increased midodrine  to 15 mg 3 times daily.  Aspiration pneumonia (HCC) Multifocal pneumonia seen on CT scan.  On vancomycin  and Zosyn .  Acute metabolic encephalopathy Mental status improved.  [facility] on hemodialysis [facility] Dialysis today.  Dyslipidemia On Lipitor  Acute on chronic diastolic CHF (congestive heart failure) (HCC) Likely more worsening pneumonia rather than heart failure.  Dialysis to manage fluid.  Hyperkalemia Improved with dialysis  Urinary tract infection due to Klebsiella species Treated with antibiotics  Itching On atarax  standing dose and as needed Benadryl   Stage II decubitus ulcer (HCC) Buttocks, see full description below, documented on 1/30.  Diarrhea As needed Imodium  and standing dose cholestyramine .  Obesity (BMI 30-39.9) BMI down to 29.71  Anxiety and depression On Klonopin  and Lexapro .        Subjective: Patient seen down at dialysis and was complaining of some itching.  No vaginal discharge.  No diarrhea as of this morning.  Physical Exam: Vitals:   05/19/23 1200  05/19/23 1230 05/19/23 1300 05/19/23 1400  BP: (!) 89/56 98/73 (!) 86/58 96/72  Pulse: 60 62 66 68  Resp: 18 20 19 20   Temp:   98.5 F (36.9 C)   TempSrc:   Axillary   SpO2: 100% 100% 100% 100%  Weight:      Height:       Physical Exam HENT:     Head: Normocephalic.  Eyes:     General: Lids are normal.     Conjunctiva/sclera: Conjunctivae normal.  Cardiovascular:      Rate and Rhythm: Normal rate and regular rhythm.     Heart sounds: Normal heart sounds, S1 normal and S2 normal.  Pulmonary:     Effort: No accessory muscle usage.     Breath sounds: Examination of the right-lower field reveals decreased breath sounds. Examination of the left-lower field reveals decreased breath sounds. Decreased breath sounds present. No wheezing, rhonchi or rales.  Abdominal:     Palpations: Abdomen is soft.     Tenderness: There is no abdominal tenderness.  Musculoskeletal:     Right lower leg: No swelling.     Left Lower Extremity: Left leg is amputated above knee.  Skin:    General: Skin is warm.     Findings: No rash.  Neurological:     Mental Status: She is alert.     Data Reviewed: White blood cell count 30.1, hemoglobin 9.7, platelet count 324, sodium 133, potassium 3.7, creatinine 5.87, albumin  2.2 Family Communication: Updated patient's daughter on the phone  Disposition: Status is: Inpatient Remains inpatient appropriate because: Continue IV vancomycin  and Zosyn .  White blood cell count still high.  Steroids discontinued yesterday.  Planned Discharge Destination: Home with Home Health    Time spent: 27 minutes  Author: Charlie Patterson, MD 05/19/2023 2:28 PM  For on call review [email or web address].

## 2023-05-20 DIAGNOSIS — R6520 Severe sepsis without septic shock: Secondary | ICD-10-CM | POA: Diagnosis not present

## 2023-05-20 DIAGNOSIS — A419 Sepsis, unspecified organism: Secondary | ICD-10-CM | POA: Diagnosis not present

## 2023-05-20 LAB — CBC
HCT: 32.9 % — ABNORMAL LOW (ref 36.0–46.0)
Hemoglobin: 10.2 g/dL — ABNORMAL LOW (ref 12.0–15.0)
MCH: 25.8 pg — ABNORMAL LOW (ref 26.0–34.0)
MCHC: 31 g/dL (ref 30.0–36.0)
MCV: 83.1 fL (ref 80.0–100.0)
Platelets: 331 10*3/uL (ref 150–400)
RBC: 3.96 MIL/uL (ref 3.87–5.11)
RDW: 15.8 % — ABNORMAL HIGH (ref 11.5–15.5)
WBC: 18.8 10*3/uL — ABNORMAL HIGH (ref 4.0–10.5)
nRBC: 0.6 % — ABNORMAL HIGH (ref 0.0–0.2)

## 2023-05-20 NOTE — Progress Notes (Signed)
 Physical Therapy Treatment Patient Details Name: Alexandra Foster MRN: [id number] DOB: Nov 02, 1950 Today's Date: 05/20/2023   History of Present Illness Pt is a 73 y.o. female presenting to hospital 05/12/23 with c/o AMS; vomited at dialysis.  Pt admitted with sepsis d/t gram-negative UTI, aspiration PNA, and acute metabolic encephalopathy.  PMH includes ESRD on HD, OA, asthma, COPD, DM type 2, htn, paroxysmal a-fib, OSA on CPAP, trach, L AKA.    PT Comments  Treatment attempted this afternoon. Pt alone at bedside and complaining of back hurting. Offered repositioning in bed and transfer to chair via hoyer. Pt very adamant that she doesn't want to move at all due to her back pain. Explained the benefits of mobility and attempted ROM of B LE to assist in position of comfort. Pt again refuses mobility attempts and has a difficult time staying awake for session. Updated RN. Will continue to progress as able.    If plan is discharge home, recommend the following: Two people to help with walking and/or transfers;Two people to help with bathing/dressing/bathroom;Assistance with cooking/housework;Assist for transportation;Help with stairs or ramp for entrance   Can travel by private vehicle        Equipment Recommendations  None recommended by PT    Recommendations for Other Services       Precautions / Restrictions Precautions Precautions: Fall Precaution Comments: PEG tube; R IJ perm cath Restrictions Weight Bearing Restrictions Per Provider Order: No Other Position/Activity Restrictions: L AKA     Mobility  Bed Mobility               General bed mobility comments: pt refused all mobility this session    Transfers                        Ambulation/Gait                   Stairs             Wheelchair Mobility     Tilt Bed    Modified Rankin (Stroke Patients Only)       Balance                                             Cognition Arousal: Lethargic Behavior During Therapy: Flat affect Overall Cognitive Status: No family/caregiver present to determine baseline cognitive functioning                                 General Comments: very lethargic with limited ability to keep eyes open throughout session. When awake, pt asking to be left alone        Exercises Other Exercises Other Exercises: assisted in attempting repositioning in bed including ROM for R LE and active SLRs and hip abd/add with L AKA. Very minimal participation noted by patient    General Comments        Pertinent Vitals/Pain Pain Assessment Pain Assessment: Faces Faces Pain Scale: Hurts even more Pain Location: low back- secondary to skin breakdown Pain Descriptors / Indicators: Aching, Grimacing, Dull, Discomfort Pain Intervention(s): Limited activity within patient's tolerance, Repositioned    Home Living                          Prior  Function            PT Goals (current goals can now be found in the care plan section) Acute Rehab PT Goals Patient Stated Goal: to improve overall strength PT Goal Formulation: With patient Time For Goal Achievement: 06/01/23 Potential to Achieve Goals: Fair Progress towards PT goals: Progressing toward goals    Frequency    Min 1X/week      PT Plan      Co-evaluation              AM-PAC PT 6 Clicks Mobility   Outcome Measure  Help needed turning from your back to your side while in a flat bed without using bedrails?: A Lot Help needed moving from lying on your back to sitting on the side of a flat bed without using bedrails?: Total Help needed moving to and from a bed to a chair (including a wheelchair)?: Total Help needed standing up from a chair using your arms (e.g., wheelchair or bedside chair)?: Total Help needed to walk in hospital room?: Total Help needed climbing 3-5 steps with a railing? : Total 6 Click Score: 7    End of  Session   Activity Tolerance: Patient tolerated treatment well Patient left: in bed;with call bell/phone within reach;with bed alarm set Nurse Communication: Mobility status;Precautions PT Visit Diagnosis: Other abnormalities of gait and mobility (R26.89);Muscle weakness (generalized) (M62.81)     Time: 8477-8469 PT Time Calculation (min) (ACUTE ONLY): 8 min  Charges:    $Therapeutic Activity: 8-22 mins PT General Charges $$ ACUTE PT VISIT: 1 Visit                     Corean Dade, PT, DPT, GCS [phone]    Astou Lada 05/20/2023, 4:45 PM

## 2023-05-20 NOTE — Progress Notes (Signed)
 Nutrition Follow-up  DOCUMENTATION CODES:   Not applicable  INTERVENTION:   -Continue TF via g-tube:    Nepro @ 45 ml/hr    60 ml Prosource TF daily   30 ml free water  flush every 4 hours   Tube feeding regimen provides 2024 kcal (100% of needs), 107 grams of protein, and 785 ml of H2O. Total free watrer: 965 ml daily   -Continue renal MVI daily via tube -Continue 1 packet Juven BID via tube, each packet provides 95 calories, 2.5 grams of protein (collagen), and 9.8 grams of carbohydrate (3 grams sugar); also contains 7 grams of L-arginine and L-glutamine, 300 mg vitamin C , 15 mg vitamin E , 1.2 mcg vitamin B-12, 9.5 mg zinc , 200 mg calcium , and 1.5 g  Calcium  Beta-hydroxy-Beta-methylbutyrate to support wound healing   NUTRITION DIAGNOSIS:   Inadequate oral intake related to dysphagia, inability to eat as evidenced by NPO status.  Ongoing  GOAL:   Patient will meet greater than or equal to 90% of their needs  Met with TF  MONITOR:   TF tolerance  REASON FOR ASSESSMENT:   Consult Enteral/tube feeding initiation and management  ASSESSMENT:   Pt with end-stage renal disease on hemodialysis, osteoarthritis, asthma, COPD, type 2 diabetes mellitus, GERD, hypertension and paroxysmal atrial fibrillation and obstructive sleep apnea on CPAP presented with confusion with intermittent vomiting 3 days PTA.  Reviewed I/O's: +623 ml x 24 hours and +9.8 L since admission  Pt sitting up in bed, working with PT at time of visit. Pt smiling and much more alert and interactive in comparison to previous visit. Pt reports feeling well today and denies any issues with TF. She denies any nausea, vomiting, or abdominal pain. No family at bedside. Per PT, pt was just cleaned.   Pt NPO due to dysphagia and aspiration pneumonia; she receives TF via g-tube as sole source nutrition. Home regimen is Nepro @ 45 ml/hr, 60 ml Prosource TF daily, and 30 ml free water  flush every 4 hours. Tube feeding  regimen provides 2024 kcal (100% of needs), 107 grams of protein, and 785 ml of H2O. Total free watrer: 965 ml daily .   No wt loss noted since admission. Suspect some wt fluctuations related to edema as well as fluid changes from HD. Noted pt +9.8 L since admission. Per nephrology notes, EDW 75.6 kg.   Medications reviewed and include vitamin C , vitamin D3, questran , and reglan .  Labs reviewed: Na: 133, K, Mg, and Phos WDL, CBGS: 133 (inpatient orders for glycemic control are none).    Diet Order:   Diet Order     None       EDUCATION NEEDS:   No education needs have been identified at this time  Skin:  Skin Assessment: Skin Integrity Issues: Skin Integrity Issues:: Other (Comment), Stage II Stage II: rt and lt buttocks Other: MASD under bilateral breasts, IAD to bilateral groin  Last BM:  05/20/23 (type 7)  Height:   Ht Readings from Last 1 Encounters:  05/12/23 5' 4 (1.626 m)    Weight:   Wt Readings from Last 1 Encounters:  05/20/23 84.4 kg    Ideal Body Weight:  54.5 kg  BMI:  Body mass index is 31.94 kg/m.  Estimated Nutritional Needs:   Kcal:  1650-1850  Protein:  85-100 grams  Fluid:  1000 ml + UOP    Margery ORN, RD, LDN, CDCES Registered Dietitian III Certified Diabetes Care and Education Specialist If unable to reach this RD,  please use RD Inpatient group chat on secure chat between hours of 8am-4 pm daily

## 2023-05-20 NOTE — TOC Progression Note (Signed)
 Transition of Care [facility]) - Progression Note    Patient Details  Name: Alexandra Foster MRN: [id number] Date of Birth: May 18, 1950  Transition of Care [facility] CM/SW Contact  Tomasa JAYSON Childes, RN Phone Number: 05/20/2023, 11:13 AM  Clinical Narrative:    Attempt to reach patient's spouse and daughter at contact number listed . No answer. Left a message requesting return call.          Expected Discharge Plan and Services                                               Social Determinants of Health (SDOH) Interventions SDOH Screenings   Food Insecurity: No Food Insecurity (05/14/2023)  Housing: Low Risk  (05/14/2023)  Transportation Needs: No Transportation Needs (05/14/2023)  Recent Concern: Transportation Needs - Unmet Transportation Needs (03/10/2023)  Utilities: Not At Risk (05/14/2023)  Alcohol  Screen: Low Risk  (02/11/2021)  Depression (PHQ2-9): Low Risk  (03/20/2023)  Recent Concern: Depression (PHQ2-9) - Medium Risk (02/27/2023)  Financial Resource Strain: Medium Risk (08/09/2020)  Physical Activity: Inactive (08/09/2020)  Social Connections: Moderately Integrated (05/14/2023)  Stress: No Stress Concern Present (08/09/2020)  Tobacco Use: Medium Risk (05/14/2023)    Readmission Risk Interventions    02/16/2023   12:26 PM 12/26/2022   11:15 AM  Readmission Risk Prevention Plan  Transportation Screening Complete Complete  PCP or Specialist Appt within 3-5 Days Complete   HRI or Home Care Consult Complete   Social Work Consult for Recovery Care Planning/Counseling Complete   Palliative Care Screening Not Applicable   Medication Review [employer]) Complete Complete  PCP or Specialist appointment within 3-5 days of discharge  Complete  HRI or Home Care Consult  Complete  SW Recovery Care/Counseling Consult  Complete  Palliative Care Screening  Not Applicable  Skilled Nursing Facility  Not Applicable

## 2023-05-20 NOTE — Evaluation (Signed)
 Occupational Therapy Evaluation Patient Details Name: Alexandra Foster MRN: [id number] DOB: 1950-11-21 Today's Date: 05/20/2023   History of Present Illness Pt is a 73 y.o. female presenting to hospital 05/12/23 with c/o AMS; vomited at dialysis.  Pt admitted with sepsis d/t gram-negative UTI, aspiration PNA, and acute metabolic encephalopathy.  PMH includes ESRD on HD, OA, asthma, COPD, DM type 2, htn, paroxysmal a-fib, OSA on CPAP, trach, L AKA.   Clinical Impression   Pt was seen for OT evaluation this date. Prior to hospital admission, pt was living at home with 24/7 assist from family, etc. With total assist for ADLs, IADLs, and hoyer lift transfers to her W/C. Pt was receiving HH therapy services.  Pt presents to acute OT demonstrating impaired ADL performance and functional mobility 2/2 weakness and low activity tolerance (See OT problem list for additional functional deficits). Pt reports tender/soreness to her buttocks during peri-care. Pt currently requires Max A to roll to the R side and Mod A to roll to the L side with max verbal cueing and tactile assist for placement on bed rails. Pt able to maintain sidelying while peri-care and brief change provided with total assist. Max A x2 for scooting/repositioning to [facility]. Pt would benefit from skilled OT services to address noted impairments and functional limitations (see below for any additional details) in order to maximize safety and independence while minimizing falls risk and caregiver burden. Do anticipate the need for follow up OT services upon acute hospital DC.        If plan is discharge home, recommend the following: Two people to help with bathing/dressing/bathroom;Two people to help with walking and/or transfers    Functional Status Assessment  Patient has had a recent decline in their functional status and demonstrates the ability to make significant improvements in function in a reasonable and predictable amount of time.   Equipment Recommendations  None recommended by OT    Recommendations for Other Services       Precautions / Restrictions Precautions Precautions: Fall Precaution Comments: PEG tube; R IJ perm cath Restrictions Weight Bearing Restrictions Per Provider Order: No Other Position/Activity Restrictions: L AKA      Mobility Bed Mobility Overal bed mobility: Needs Assistance Bed Mobility: Rolling Rolling: Mod assist, Max assist         General bed mobility comments: mod/Max A for logrolling to both sides in bed; Max A to the L; Mod A to the R side; able to hold bed rail and maintain position while peri-care provided    Transfers                   General transfer comment: Deferred (pt uses hoyer lift)      Balance                                           ADL either performed or assessed with clinical judgement   ADL Overall ADL's : Needs assistance/impaired                             Toileting- Clothing Manipulation and Hygiene: Total assistance;Bed level         General ADL Comments: total assist for peri-care after BM in bed     Vision         Perception  Praxis         Pertinent Vitals/Pain Pain Assessment Pain Assessment: Faces Faces Pain Scale: Hurts little more Pain Location: buttocks with peri-care/hygiene Pain Descriptors / Indicators: Tender Pain Intervention(s): Monitored during session, Repositioned     Extremity/Trunk Assessment Upper Extremity Assessment Upper Extremity Assessment: Generalized weakness   Lower Extremity Assessment Lower Extremity Assessment: Generalized weakness;LLE deficits/detail LLE Deficits / Details: L AKA       Communication Communication Communication: No apparent difficulties   Cognition Arousal: Alert Behavior During Therapy: Anxious Overall Cognitive Status: No family/caregiver present to determine baseline cognitive functioning                                  General Comments: Oriented to person, hospital, general situation and month/year; generalized confusion noted at times though     General Comments  PEG tube in place; very dry skin    Exercises Other Exercises Other Exercises: Edu on role of OT in acute setting.   Shoulder Instructions      Home Living Family/patient expects to be discharged to:: Private residence Living Arrangements: Spouse/significant other;Children Available Help at Discharge: Family;Available PRN/intermittently Type of Home: House Home Access: Ramped entrance     Home Layout: Two level;Able to live on main level with bedroom/bathroom               Home Equipment: Wheelchair - power;Wheelchair - manual   Additional Comments: family present and verified this information      Prior Functioning/Environment Prior Level of Function : Needs assist             Mobility Comments: Pt reports using hoyer lift to transfer to w/c every other day; pt reports having PT and OT at home 2x/week (PT working on LE ex's in bed; pt reports sitting on EOB a while ago with therapy but did not go well although pt did not elaborate); pt reports using power w/c; requires assist with bed mobility but helps as much as she can ADLs Comments: Pt is total A for self care from bed level, tube feedings at baseline, and can wash face with set up A as well as assisting staff with rolling for care.        OT Problem List: Decreased strength;Decreased activity tolerance      OT Treatment/Interventions: Self-care/ADL training;Therapeutic exercise;Patient/family education;Therapeutic activities    OT Goals(Current goals can be found in the care plan section) Acute Rehab OT Goals Patient Stated Goal: improve strength OT Goal Formulation: With patient/family Time For Goal Achievement: 06/03/23 Potential to Achieve Goals: Good ADL Goals Pt Will Perform Grooming: with min assist;bed level Pt/caregiver will  Perform Home Exercise Program: Increased strength;Both right and left upper extremity;With theraband;With minimal assist;With written HEP provided  OT Frequency: Min 1X/week    Co-evaluation              AM-PAC OT 6 Clicks Daily Activity     Outcome Measure Help from another person eating meals?: Total (PEG) Help from another person taking care of personal grooming?: A Lot Help from another person toileting, which includes using toliet, bedpan, or urinal?: Total Help from another person bathing (including washing, rinsing, drying)?: Total Help from another person to put on and taking off regular upper body clothing?: Total Help from another person to put on and taking off regular lower body clothing?: Total 6 Click Score: 7   End of Session Nurse Communication:  Mobility status  Activity Tolerance: Patient tolerated treatment well Patient left: in bed;with call bell/phone within reach;with bed alarm set  OT Visit Diagnosis: Other abnormalities of gait and mobility (R26.89);Muscle weakness (generalized) (M62.81)                Time: 9097-9076 OT Time Calculation (min): 21 min Charges:  OT General Charges $OT Visit: 1 Visit OT Evaluation $OT Eval Moderate Complexity: 1 Mod OT Treatments $Self Care/Home Management : 8-22 mins Shawndale Kilpatrick, OTR/L  05/20/23, 11:01 AM  Kasin Tonkinson E Shailynn Fong 05/20/2023, 10:59 AM

## 2023-05-20 NOTE — Plan of Care (Signed)

## 2023-05-20 NOTE — Progress Notes (Signed)
 [facility]  ROUNDING NOTE   Subjective:   Alexandra Foster is a 73 y.o. female with history of hypertension, coronary artery disease, congestive heart failure, COPD, cirrhosis, diabetes, status post left AKA, status post G-tube placement, and end stage renal disease on hemodialysis.  Patient presents to ED from dialysis at the request of family due to altered mental status.  Patient has been admitted for Lower urinary tract infectious disease [N39.0] Altered mental status, unspecified altered mental status type [R41.82] Pneumonia due to infectious organism, unspecified laterality, unspecified part of lung [J18.9] Sepsis due to gram-negative UTI (HCC) [A41.50, N39.0]  Patient is known to our practice from previous admissions and receives outpatient dialysis treatments at [facility] on a TTS schedule, supervised by [facility].    Update Patient seen laying in bed Complaining of sacral discomfort No family present Tube feeds infusing   Objective:  Vital signs in last 24 hours:  Temp:  [97.3 F (36.3 C)-98.7 F (37.1 C)] 97.3 F (36.3 C) (02/05 0835) Pulse Rate:  [58-68] 66 (02/05 0835) Resp:  [18-20] 19 (02/05 0835) BP: (83-96)/(48-72) 90/57 (02/05 0835) SpO2:  [97 %-100 %] 99 % (02/05 0835) Weight:  [84.4 kg] 84.4 kg (02/05 0546)  Weight change: -2.1 kg Filed Weights   05/19/23 0832 05/19/23 1300 05/20/23 0546  Weight: 78.5 kg 78 kg 84.4 kg    Intake/Output: I/O last 3 completed shifts: In: 3196.5 [NG/GT:2996.5; IV Piggyback:200] Out: 500 [Other:500]   Intake/Output this shift:  No intake/output data recorded.  Physical Exam: General: NAD  Head: Normocephalic, atraumatic. Moist oral mucosal membranes  Eyes: Anicteric  Lungs:  Clear to auscultation, normal effort  Heart: Regular rate and rhythm  Abdomen:  Soft, nontender, obese  Extremities:  trace peripheral edema.  Neurologic: Alert and oriented to self, moving all four extremities   Skin: No lesions, dry, flaky  Access: Rt chest permcath     Basic Metabolic Panel: Recent Labs  Lab 05/14/23 0847 05/16/23 0414 05/17/23 0623 05/19/23 0653  NA 134* 137 135 133*  K 5.2* 3.4* 3.2* 3.7  CL 101 101 98 97*  CO2 18* 23 24 19*  GLUCOSE 137* 126* 95 122*  BUN 51* 48* 43* 105*  CREATININE 5.64* 4.70* 3.97* 5.87*  CALCIUM  8.9 9.2 8.7* 9.3  PHOS 4.0  --   --   --     Liver Function Tests: Recent Labs  Lab 05/14/23 0847 05/19/23 0805  ALBUMIN  2.2* 2.2*   No results for input(s): LIPASE, AMYLASE in the last 168 hours. No results for input(s): AMMONIA in the last 168 hours.  CBC: Recent Labs  Lab 05/15/23 0324 05/16/23 0414 05/17/23 0623 05/19/23 0653 05/20/23 0546  WBC 27.5* 29.1* 26.3* 30.1* 18.8*  NEUTROABS 19.4* 17.2*  --   --   --   HGB 11.7* 10.9* 9.9* 9.7* 10.2*  HCT 36.0 35.0* 30.8* 29.9* 32.9*  MCV 82.2 83.3 81.7 81.3 83.1  PLT 270 296 176 324 331    Cardiac Enzymes: No results for input(s): CKTOTAL, CKMB, CKMBINDEX, TROPONINI in the last 168 hours.  BNP: Invalid input(s): POCBNP  CBG: Recent Labs  Lab 05/15/23 0027 05/15/23 0358 05/15/23 1938 05/15/23 2358 05/16/23 0443  GLUCAP 126* 149* 141* 110* 133*    Microbiology: Results for orders placed or performed during the hospital encounter of 05/13/23  Resp panel by RT-PCR (RSV, Flu A&B, Covid) Anterior Nasal Swab     Status: None   Collection Time: 05/12/23  4:37 PM  Specimen: Anterior Nasal Swab  Result Value Ref Range Status   SARS Coronavirus 2 by RT PCR NEGATIVE NEGATIVE Final    Comment: (NOTE) SARS-CoV-2 target nucleic acids are NOT DETECTED.  The SARS-CoV-2 RNA is generally detectable in upper respiratory specimens during the acute phase of infection. The lowest concentration of SARS-CoV-2 viral copies this assay can detect is 138 copies/mL. A negative result does not preclude SARS-Cov-2 infection and should not be used as the sole basis for treatment  or other patient management decisions. A negative result may occur with  improper specimen collection/handling, submission of specimen other than nasopharyngeal swab, presence of viral mutation(s) within the areas targeted by this assay, and inadequate number of viral copies(<138 copies/mL). A negative result must be combined with clinical observations, patient history, and epidemiological information. The expected result is Negative.  Fact Sheet for Patients:  bloggercourse.com  Fact Sheet for Healthcare Providers:  seriousbroker.it  This test is no t yet approved or cleared by the [place]  FDA and  has been authorized for detection and/or diagnosis of SARS-CoV-2 by FDA under an Emergency Use Authorization (EUA). This EUA will remain  in effect (meaning this test can be used) for the duration of the COVID-19 declaration under Section 564(b)(1) of the Act, 21 U.S.C.section 360bbb-3(b)(1), unless the authorization is terminated  or revoked sooner.       Influenza A by PCR NEGATIVE NEGATIVE Final   Influenza B by PCR NEGATIVE NEGATIVE Final    Comment: (NOTE) The Xpert Xpress SARS-CoV-2/FLU/RSV plus assay is intended as an aid in the diagnosis of influenza from Nasopharyngeal swab specimens and should not be used as a sole basis for treatment. Nasal washings and aspirates are unacceptable for Xpert Xpress SARS-CoV-2/FLU/RSV testing.  Fact Sheet for Patients: bloggercourse.com  Fact Sheet for Healthcare Providers: seriousbroker.it  This test is not yet approved or cleared by the [place]  FDA and has been authorized for detection and/or diagnosis of SARS-CoV-2 by FDA under an Emergency Use Authorization (EUA). This EUA will remain in effect (meaning this test can be used) for the duration of the COVID-19 declaration under Section 564(b)(1) of the Act, 21 U.S.C. section  360bbb-3(b)(1), unless the authorization is terminated or revoked.     Resp Syncytial Virus by PCR NEGATIVE NEGATIVE Final    Comment: (NOTE) Fact Sheet for Patients: bloggercourse.com  Fact Sheet for Healthcare Providers: seriousbroker.it  This test is not yet approved or cleared by the [place]  FDA and has been authorized for detection and/or diagnosis of SARS-CoV-2 by FDA under an Emergency Use Authorization (EUA). This EUA will remain in effect (meaning this test can be used) for the duration of the COVID-19 declaration under Section 564(b)(1) of the Act, 21 U.S.C. section 360bbb-3(b)(1), unless the authorization is terminated or revoked.  Performed at [facility], [address] [place], [place] [address]   Culture, blood (routine x 2)     Status: None   Collection Time: 05/12/23 11:45 PM   Specimen: BLOOD  Result Value Ref Range Status   Specimen Description BLOOD RIGHT ANTECUBITAL  Final   Special Requests   Final    BOTTLES DRAWN AEROBIC AND ANAEROBIC Blood Culture adequate volume   Culture   Final    NO GROWTH 5 DAYS Performed at [facility], [address] [place], [place] [address]    Report Status 05/18/2023 FINAL  Final  Culture, blood (routine x 2)     Status: None   Collection Time: 05/13/23  12:00 AM   Specimen: BLOOD  Result Value Ref Range Status   Specimen Description BLOOD LEFT WRIST  Final   Special Requests   Final    AEROBIC BOTTLE ONLY Blood Culture results may not be optimal due to an inadequate volume of blood received in culture bottles   Culture   Final    NO GROWTH 5 DAYS Performed at [facility], [address] [place], [place] [address]    Report Status 05/18/2023 FINAL  Final  Urine Culture (for pregnant, neutropenic or urologic patients or patients with an indwelling urinary catheter)     Status: Abnormal   Collection Time: 05/13/23 12:10 AM    Specimen: Urine, Clean Catch  Result Value Ref Range Status   Specimen Description   Final    URINE, CLEAN CATCH Performed at [facility], [address], [place], [place] [address]    Special Requests   Final    NONE Performed at [facility], [address] [place], [place] [address]    Culture >=100,000 COLONIES/mL KLEBSIELLA PNEUMONIAE (A)  Final   Report Status 05/15/2023 FINAL  Final   Organism ID, Bacteria KLEBSIELLA PNEUMONIAE (A)  Final      Susceptibility   Klebsiella pneumoniae - MIC*    AMPICILLIN  RESISTANT Resistant     CEFAZOLIN  <=4 SENSITIVE Sensitive     CEFEPIME  <=0.12 SENSITIVE Sensitive     CEFTRIAXONE  <=0.25 SENSITIVE Sensitive     CIPROFLOXACIN <=0.25 SENSITIVE Sensitive     GENTAMICIN  <=1 SENSITIVE Sensitive     IMIPENEM <=0.25 SENSITIVE Sensitive     NITROFURANTOIN <=16 SENSITIVE Sensitive     TRIMETH/SULFA <=20 SENSITIVE Sensitive     AMPICILLIN /SULBACTAM 4 SENSITIVE Sensitive     PIP/TAZO <=4 SENSITIVE Sensitive ug/mL    * >=100,000 COLONIES/mL KLEBSIELLA PNEUMONIAE  MRSA Next Gen by PCR, Nasal     Status: Abnormal   Collection Time: 05/14/23  4:30 PM   Specimen: Nasal Mucosa; Nasal Swab  Result Value Ref Range Status   MRSA by PCR Next Gen DETECTED (A) NOT DETECTED Final    Comment: RESULT CALLED TO, READ BACK BY AND VERIFIED WITH: DORATHY MUHORO @1908  ON 05/14/23 SKL (NOTE) The GeneXpert MRSA Assay (FDA approved for NASAL specimens only), is one component of a comprehensive MRSA colonization surveillance program. It is not intended to diagnose MRSA infection nor to guide or monitor treatment for MRSA infections. Test performance is not FDA approved in patients less than 85 years old. Performed at [facility], [address] [place], [place] [address]     Coagulation Studies: No results for input(s): LABPROT, INR in the last 72 hours.   Urinalysis: No results for input(s): COLORURINE, LABSPEC,  PHURINE, GLUCOSEU, HGBUR, BILIRUBINUR, KETONESUR, PROTEINUR, UROBILINOGEN, NITRITE, LEUKOCYTESUR in the last 72 hours.  Invalid input(s): APPERANCEUR     Imaging: No results found.    Medications:    feeding supplement (NEPRO CARB STEADY) 45 mL/hr at 05/19/23 1836   piperacillin -tazobactam (ZOSYN )  IV 2.25 g (05/20/23 0554)    ascorbic acid   250 mg Oral Daily   atorvastatin   20 mg Oral Daily   vitamin D3  2,000 Units Oral Daily   cholestyramine   4 g Oral TID   epoetin  alfa-epbx (RETACRIT ) injection  4,000 Units Intravenous Q T,Th,Sa-HD   escitalopram   10 mg Oral Daily   famotidine   10 mg Oral Daily   fludrocortisone   0.1 mg Oral Daily   free water   30 mL Per  Tube Q4H   heparin  injection (subcutaneous)  5,000 Units Subcutaneous Q8H   hydrOXYzine   50 mg Oral TID   metoCLOPramide   5 mg Oral TID AC & HS   midodrine   15 mg Oral TID with meals   multivitamin  1 tablet Oral QHS   nutrition supplement (JUVEN)  1 packet Oral BID BM   nystatin    Topical TID   mouth rinse  15 mL Mouth Rinse 4 times per day   acetaminophen  **OR** acetaminophen , chlorpheniramine-HYDROcodone, diphenhydrAMINE , ipratropium-albuterol , liver oil-zinc  oxide, loperamide  HCl, LORazepam , magnesium  hydroxide, ondansetron  **OR** ondansetron  (ZOFRAN ) IV, mouth rinse, traZODone   Assessment/ Plan:  [name] is a 73 y.o.  female with history of hypertension, coronary artery disease, congestive heart failure, COPD, cirrhosis, diabetes, status post left AKA, status post G-tube placement, and end stage renal disease on hemodialysis.  Patient presents to ED from dialysis at the request of family due to altered mental status.  Patient has been admitted for Lower urinary tract infectious disease [N39.0] Altered mental status, unspecified altered mental status type [R41.82] Pneumonia due to infectious organism, unspecified laterality, unspecified part of lung [J18.9] Sepsis due to  gram-negative UTI (HCC) [A41.50, N39.0]  [facility]/TTs/Rt permcath/75.6kg  End-stage renal disease: Dialysis received yesterday.  UF decreased due to hypotension.  0.5 L achieved.  Next treatment scheduled for Thursday.  #2.  Sepsis, gram negative UTI. Received Lactated ringers  in ED. Urine culture with Klebsiella.  Patient currently receiving Zosyn  and vancomycin .  White count has decreased significantly.  #3: Anemia of chronic kidney disease Lab Results  Component Value Date   HGB 10.2 (L) 05/20/2023    Hgb at desired goal.  Continue low dose EPO with dialysis.    #4: Secondary Hyperparathyroidism: with outpatient labs: PTH 35, phosphorus 7.6, calcium  9.4 on 04/21/23.   Lab Results  Component Value Date   CALCIUM  9.3 05/19/2023   PHOS 4.0 05/14/2023    Bone minerals acceptable.    LOS: 7 Mattia Osterman 2/5/20251:03 PM

## 2023-05-20 NOTE — Progress Notes (Addendum)
 Progress Note   Patient: Alexandra Foster FMW:[id number] DOB: 1951-03-20 DOA: 05/13/2023     8 DOS: the patient was seen and examined on 05/21/2023   Brief hospital course: 73 year old female with end-stage renal disease on hemodialysis, osteoarthritis, asthma, COPD, type 2 diabetes mellitus, GERD, hypertension and paroxysmal atrial fibrillation and obstructive sleep apnea on CPAP presented to the ER with confusion over the last 3 days with intermittent vomiting.  She was admitted with clinical sepsis secondary to right upper lobe pneumonia likely aspiration and urinary infection.  Started on antibiotic.  1/29.  Patient has some cough and shortness of breath.  Does not wear oxygen at home.  Blood pressure very high in the right arm and very low in the left arm.  CT angio of the left arm does not show any blockages.  May have a steal from the right arm with a catheter in the right chest.  Feeding tube confirmed then placed last night and can restart tube feeds.  Had a fever of 103. 1/30.  Patient asking for water .  Patient gets all of her nutrition through her tube. 1/31.  Patient complains of dry mouth. 2/1.  Called to rapid response at dialysis.  Patient had large brown BM then was less responsive and has shortness of breath.  Bipap ordered.  ABG.  Iv albumin .  Spoke with nephrology to remove fluid.  CT scan showing worsening pneumonia.  Antibiotics switched to Zosyn  and vancomycin  2/2.  Respiratory status much improved from yesterday.  Off BiPAP and on 3 L of oxygen.  Will try to taper oxygen.  Blood pressure on the lower side will increase midodrine  to 15 mg 3 times daily. 2/3.  This morning was down to 1 L asked nursing staff to try to taper off.  As needed Imodium  for diarrhea.  Steroids discontinued. 2/4.  White blood cell count 30.1.  Diarrhea not documented.  I assumed care 05/20/23 WBC improved to 18.8k Pt remains on IV Zosyn      Assessment and Plan: * Severe sepsis (HCC) Present on  admission with leukocytosis fever tachycardia and tachypnea and acute respiratory failure.  Patient has right upper lobe pneumonia and Klebsiella growing out of urine culture.  White count still very elevated at 30.1.[name]  Antibiotics switched on 2/1 to vancomycin  and Zosyn .  Continue these antibiotics for now.  Steroids discontinued after dose on 2/3.  Acute hypoxic respiratory failure (HCC) With respiratory distress on 2/1 was placed on BiPAP.  Last pulse ox on room air.  This problem has resolved.  Hypotension On midodrine  and florinef  for chronically low blood pressure.  Increased midodrine  to 15 mg 3 times daily.  Aspiration pneumonia (HCC) Multifocal pneumonia seen on CT scan.  On Zosyn .  Acute metabolic encephalopathy Mental status improved.  ESRD on hemodialysis [facility]) Dialysis today.  Dyslipidemia On Lipitor  Acute on chronic diastolic CHF (congestive heart failure) (HCC) Likely more worsening pneumonia rather than heart failure.  Dialysis to manage fluid.  Hyperkalemia Improved with dialysis  Urinary tract infection due to Klebsiella species Treated with antibiotics  Itching On atarax  standing dose and as needed Benadryl   Stage II decubitus ulcer (HCC) Buttocks, see full description below, documented on 1/30.  Diarrhea As needed Imodium  and standing dose cholestyramine .  Obesity (BMI 30-39.9) BMI down to 29.71  Anxiety and depression On Klonopin  and Lexapro .        Subjective: Pt seen resting in bed this AM.  She denies specific complaints.  No acute events reported.  Feels a little better today than yesterday.   Physical Exam: Vitals:   05/21/23 1140 05/21/23 1141 05/21/23 1145 05/21/23 1200  BP: 100/62 100/62 101/61 (!) 88/52  Pulse: 72 75    Resp: (!) 22 (!) 24 (!) 28 20  Temp:  99.5 F (37.5 C)  99 F (37.2 C)  TempSrc:  Axillary    SpO2: 92% 92%    Weight:  80.3 kg    Height:       General exam: awake, alert, no acute distress, chronically  ill appearing HEENT: moist mucus membranes, hearing grossly normal  Respiratory system: decreased breath sounds, no wheezes, normal respiratory effort on 1 L/min [place] o2. Cardiovascular system: normal S1/S2, RRR, perm cath right upper chest   Gastrointestinal system: soft, NT, ND, no HSM felt, +bowel sounds. Central nervous system: A&O x 2+. no gross focal neurologic deficits, normal speech Skin: dry flaky skin, normal temperature Psychiatry: normal mood, flat affect      Data Reviewed: Notable labs --  Na 133, Bl 97, bicarb 19, glucose 122, BUN 105, Cr 5.87, gap 17, albumin  2.2 WBC improved 30.1 >> 18.8 Hbg stable 10.2     Family Communication: Dr. Josette Updated patient's daughter on the phone 2/4. No family at bedside on rounds. Will attempt to call as time allows.   Disposition: Status is: Inpatient Remains inpatient appropriate because: Persistent leukocytosis, remains on IV antibiotics pending further clinical improvement.    Planned Discharge Destination: Home with Home Health    Time spent: 38 minutes  Author: Burnard DELENA Cunning, DO 05/21/2023 4:39 PM  For on call review [email or web address].

## 2023-05-21 DIAGNOSIS — A419 Sepsis, unspecified organism: Secondary | ICD-10-CM | POA: Diagnosis not present

## 2023-05-21 DIAGNOSIS — R6520 Severe sepsis without septic shock: Secondary | ICD-10-CM | POA: Diagnosis not present

## 2023-05-21 LAB — RENAL FUNCTION PANEL
Albumin: 2.1 g/dL — ABNORMAL LOW (ref 3.5–5.0)
Anion gap: 16 — ABNORMAL HIGH (ref 5–15)
BUN: 78 mg/dL — ABNORMAL HIGH (ref 8–23)
CO2: 22 mmol/L (ref 22–32)
Calcium: 9.4 mg/dL (ref 8.9–10.3)
Chloride: 97 mmol/L — ABNORMAL LOW (ref 98–111)
Creatinine, Ser: 4.45 mg/dL — ABNORMAL HIGH (ref 0.44–1.00)
GFR, Estimated: 10 mL/min — ABNORMAL LOW (ref >60)
Glucose, Bld: 95 mg/dL (ref 70–99)
Phosphorus: 4.7 mg/dL — ABNORMAL HIGH (ref 2.5–4.6)
Potassium: 3.5 mmol/L (ref 3.5–5.1)
Sodium: 135 mmol/L (ref 135–145)

## 2023-05-21 LAB — CBC
HCT: 32.4 % — ABNORMAL LOW (ref 36.0–46.0)
Hemoglobin: 10.2 g/dL — ABNORMAL LOW (ref 12.0–15.0)
MCH: 26 pg (ref 26.0–34.0)
MCHC: 31.5 g/dL (ref 30.0–36.0)
MCV: 82.4 fL (ref 80.0–100.0)
Platelets: 391 10*3/uL (ref 150–400)
RBC: 3.93 MIL/uL (ref 3.87–5.11)
RDW: 15.8 % — ABNORMAL HIGH (ref 11.5–15.5)
WBC: 17.9 10*3/uL — ABNORMAL HIGH (ref 4.0–10.5)
nRBC: 0.5 % — ABNORMAL HIGH (ref 0.0–0.2)

## 2023-05-21 MED ORDER — HEPARIN SODIUM (PORCINE) 1000 UNIT/ML IJ SOLN
1000.0000 [IU] | INTRAMUSCULAR | Status: DC | PRN
  Administered 2023-05-21: 1000 [IU]

## 2023-05-21 MED ORDER — ALBUMIN HUMAN 25 % IV SOLN
25.0000 g | Freq: Once | INTRAVENOUS | Status: AC
  Administered 2023-05-21: 25 g via INTRAVENOUS

## 2023-05-21 MED ORDER — ALBUMIN HUMAN 25 % IV SOLN
INTRAVENOUS | Status: AC
  Filled 2023-05-21: qty 100

## 2023-05-21 MED ORDER — EPOETIN ALFA-EPBX 4000 UNIT/ML IJ SOLN
INTRAMUSCULAR | Status: AC
  Filled 2023-05-21: qty 1

## 2023-05-21 NOTE — Progress Notes (Addendum)
 Progress Note   Patient: Alexandra Foster FMW:[id number] DOB: 1950/06/20 DOA: 05/13/2023     9 DOS: the patient was seen and examined on 05/22/2023   Brief hospital course: 73 year old female with end-stage renal disease on hemodialysis, osteoarthritis, asthma, COPD, type 2 diabetes mellitus, GERD, hypertension and paroxysmal atrial fibrillation and obstructive sleep apnea on CPAP presented to the ER with confusion over the last 3 days with intermittent vomiting.  She was admitted with clinical sepsis secondary to right upper lobe pneumonia likely aspiration and urinary infection.  Started on antibiotic.  1/29.  Patient has some cough and shortness of breath.  Does not wear oxygen at home.  Blood pressure very high in the right arm and very low in the left arm.  CT angio of the left arm does not show any blockages.  May have a steal from the right arm with a catheter in the right chest.  Feeding tube confirmed then placed last night and can restart tube feeds.  Had a fever of 103. 1/30.  Patient asking for water .  Patient gets all of her nutrition through her tube. 1/31.  Patient complains of dry mouth. 2/1.  Called to rapid response at dialysis.  Patient had large brown BM then was less responsive and has shortness of breath.  Bipap ordered.  ABG.  Iv albumin .  Spoke with nephrology to remove fluid.  CT scan showing worsening pneumonia.  Antibiotics switched to Zosyn  and vancomycin  2/2.  Respiratory status much improved from yesterday.  Off BiPAP and on 3 L of oxygen.  Will try to taper oxygen.  Blood pressure on the lower side will increase midodrine  to 15 mg 3 times daily. 2/3.  This morning was down to 1 L asked nursing staff to try to taper off.  As needed Imodium  for diarrhea.  Steroids discontinued. 2/4.  White blood cell count 30.1.  Diarrhea not documented.  I assumed care 05/20/23 WBC improved to 18.8k Pt remained on IV Zosyn  through 2/6, stopped having completed total of 10 days  antibiotic coverage.    Assessment and Plan: * Severe sepsis (HCC) Present on admission with leukocytosis fever tachycardia and tachypnea and acute respiratory failure.  Patient has right upper lobe pneumonia and Klebsiella growing out of urine culture.  WBC climbed to peak 30.1 (note pt was also on steroids). Antibiotics switched on 2/1 to Zosyn .  Will stop antibiotics -- 10 days completed. Off steroids since 2/3. Monitor fever curve, CBC and clinically for s/sx's of infection  Acute hypoxic respiratory failure (HCC) With respiratory distress on 2/1 was placed on BiPAP.  Last pulse ox on room air.  This problem has resolved.  Hypotension On midodrine  and florinef  for chronically low blood pressure.  Increased midodrine  to 15 mg 3 times daily.  Aspiration pneumonia (HCC) Multifocal pneumonia seen on CT scan.  Stop Zosyn . Completed total of 10 days antibiotics   Acute metabolic encephalopathy Mental status improved.  ESRD on hemodialysis [facility]) Dialysis per Nephrology  Dyslipidemia On Lipitor  Acute on chronic diastolic CHF (congestive heart failure) (HCC) Likely more worsening pneumonia rather than heart failure.  Dialysis to manage fluid.  Hyperkalemia Improved with dialysis  Urinary tract infection due to Klebsiella species Treated with antibiotics  Pneumonia due to infectious organism See aspiration pneumonia  Itching On atarax  standing dose and as needed Benadryl   Stage II decubitus ulcer (HCC) Buttocks, see full description below, documented on 1/30.  Diarrhea As needed Imodium  and standing dose cholestyramine .  Obesity (BMI 30-39.9)  BMI down to 29.71  Anxiety and depression On Klonopin  and Lexapro .        Subjective: Pt resting, seen in HD this AM.  No acute complaints or events reported.   Physical Exam: Vitals:   05/21/23 2037 05/22/23 0014 05/22/23 0351 05/22/23 0500  BP: (!) 100/53 (!) 101/53 (!) 108/55   Pulse: 63 64 67   Resp: 18 18 20     Temp: 98.3 F (36.8 C) 98.2 F (36.8 C) 99.1 F (37.3 C)   TempSrc:  Axillary    SpO2: 100% 100% 100%   Weight:    81.3 kg  Height:       General exam: sleeping comfortably, no acute distress, chronically ill appearing HEENT: moist mucus membranes, hearing grossly normal  Respiratory system: decreased breath sounds, no wheezes, normal respiratory effort on 3 L/min [place] o2. Cardiovascular system: normal S1/S2, RRR, perm cath right upper chest accessed for dialysis Gastrointestinal system: soft, NT, ND Central nervous system: A&O x 2+. no gross focal neurologic deficits, normal speech Skin: dry flaky skin, normal temperature Psychiatry: normal mood, flat affect      Data Reviewed: Notable labs --  Cl 97 BUN 78 Cr 4.45 Gap 16 Phos 4.7 Albumin  2.1  WBC improved 30.1 >> 18.8 >> 17.9 Hbg stable 10.2 >> 10.2     Family Communication: Dr. Josette Updated patient's daughter on the phone 2/4. No family at bedside on rounds. Will attempt to call as time allows. Unable to reach spouse or daughter when attempted this afternoon.   Disposition: Status is: Inpatient Remains inpatient appropriate because: Persistent leukocytosis, remains on IV antibiotics pending further clinical improvement.    Planned Discharge Destination: Home with Home Health    Time spent: 36 minutes  Author: Burnard DELENA Cunning, DO 05/22/2023 9:10 AM  For on call review [email or web address].

## 2023-05-21 NOTE — Progress Notes (Addendum)
 Received patient in bed to unit.  Alert and oriented.  Informed consent signed and in chart.   TX duration: 3.5 hours  Patient tolerated well.  Transported back to the room  Alert, without acute distress.  Hand-off given to patient's nurse Lauraine Decent  Access used: RIJ Access issues: reversed lines  Total UF removed: 1300 ml Medication(s) given: albumin  and epo Post HD VS: 100/62 Post HD weight: 80.3kg  [place], RN [facility]

## 2023-05-21 NOTE — Assessment & Plan Note (Signed)
 See aspiration pneumonia

## 2023-05-21 NOTE — Progress Notes (Signed)
 PT Cancellation Note  Patient Details Name: Birgitta Uhlir MRN: [id number] DOB: 01-10-1951   Cancelled Treatment:    Reason Eval/Treat Not Completed: Other (comment). Pt currently at HD. Will re-attempt another time.   Laneshia Pina 05/21/2023, 9:48 AM Corean Dade, PT, DPT, [facility] [phone]

## 2023-05-21 NOTE — Progress Notes (Signed)
 [facility]  ROUNDING NOTE   Subjective:   Alexandra Foster is a 73 y.o. female with history of hypertension, coronary artery disease, congestive heart failure, COPD, cirrhosis, diabetes, status post left AKA, status post G-tube placement, and end stage renal disease on hemodialysis.  Patient presents to ED from dialysis at the request of family due to altered mental status.  Patient has been admitted for Lower urinary tract infectious disease [N39.0] Altered mental status, unspecified altered mental status type [R41.82] Pneumonia due to infectious organism, unspecified laterality, unspecified part of lung [J18.9] Sepsis due to gram-negative UTI (HCC) [A41.50, N39.0]  Patient is known to our practice from previous admissions and receives outpatient dialysis treatments at [facility] on a TTS schedule, supervised by [facility].    Update  Patient seen and evaluated during dialysis   HEMODIALYSIS FLOWSHEET:  Blood Flow Rate (mL/min): 299 mL/min (Simultaneous filing. User may not have seen previous data.) Arterial Pressure (mmHg): -127.47 mmHg (Simultaneous filing. User may not have seen previous data.) Venous Pressure (mmHg): 117.16 mmHg (Simultaneous filing. User may not have seen previous data.) TMP (mmHg): 5.45 mmHg (Simultaneous filing. User may not have seen previous data.) Ultrafiltration Rate (mL/min): 831 mL/min (Simultaneous filing. User may not have seen previous data.) Dialysate Flow Rate (mL/min): 300 ml/min (Simultaneous filing. User may not have seen previous data.) Dialysis Fluid Bolus: Normal Saline  Tolerating treatment well.    Objective:  Vital signs in last 24 hours:  Temp:  [98.4 F (36.9 C)-99.8 F (37.7 C)] 99.1 F (37.3 C) (02/06 0738) Pulse Rate:  [62-69] 69 (02/06 1030) Resp:  [11-28] 22 (02/06 1030) BP: (83-101)/(42-62) 96/62 (02/06 1030) SpO2:  [90 %-100 %] 91 % (02/06 1030) Weight:  [81.6 kg-82.2 kg] 81.6 kg (02/06  0738)  Weight change: 3.7 kg Filed Weights   05/20/23 0546 05/21/23 0404 05/21/23 0738  Weight: 84.4 kg 82.2 kg 81.6 kg    Intake/Output: I/O last 3 completed shifts: In: 410 [NG/GT:360; IV Piggyback:50] Out: -    Intake/Output this shift:  No intake/output data recorded.  Physical Exam: General: NAD  Head: Normocephalic, atraumatic. Moist oral mucosal membranes  Eyes: Anicteric  Lungs:  Clear to auscultation, normal effort  Heart: Regular rate and rhythm  Abdomen:  Soft, nontender, obese  Extremities:  trace peripheral edema.  Neurologic: Alert and oriented to self, moving all four extremities  Skin: No lesions, dry, flaky  Access: Rt chest permcath     Basic Metabolic Panel: Recent Labs  Lab 05/16/23 0414 05/17/23 0623 05/19/23 0653 05/21/23 0636  NA 137 135 133* 135  K 3.4* 3.2* 3.7 3.5  CL 101 98 97* 97*  CO2 23 24 19* 22  GLUCOSE 126* 95 122* 95  BUN 48* 43* 105* 78*  CREATININE 4.70* 3.97* 5.87* 4.45*  CALCIUM  9.2 8.7* 9.3 9.4  PHOS  --   --   --  4.7*    Liver Function Tests: Recent Labs  Lab 05/19/23 0805 05/21/23 0636  ALBUMIN  2.2* 2.1*   No results for input(s): LIPASE, AMYLASE in the last 168 hours. No results for input(s): AMMONIA in the last 168 hours.  CBC: Recent Labs  Lab 05/15/23 0324 05/16/23 0414 05/17/23 0623 05/19/23 0653 05/20/23 0546 05/21/23 0636  WBC 27.5* 29.1* 26.3* 30.1* 18.8* 17.9*  NEUTROABS 19.4* 17.2*  --   --   --   --   HGB 11.7* 10.9* 9.9* 9.7* 10.2* 10.2*  HCT 36.0 35.0* 30.8* 29.9* 32.9* 32.4*  MCV 82.2  83.3 81.7 81.3 83.1 82.4  PLT 270 296 176 324 331 391    Cardiac Enzymes: No results for input(s): CKTOTAL, CKMB, CKMBINDEX, TROPONINI in the last 168 hours.  BNP: Invalid input(s): POCBNP  CBG: Recent Labs  Lab 05/15/23 0027 05/15/23 0358 05/15/23 1938 05/15/23 2358 05/16/23 0443  GLUCAP 126* 149* 141* 110* 133*    Microbiology: Results for orders placed or performed during  the hospital encounter of 05/13/23  Resp panel by RT-PCR (RSV, Flu A&B, Covid) Anterior Nasal Swab     Status: None   Collection Time: 05/12/23  4:37 PM   Specimen: Anterior Nasal Swab  Result Value Ref Range Status   SARS Coronavirus 2 by RT PCR NEGATIVE NEGATIVE Final    Comment: (NOTE) SARS-CoV-2 target nucleic acids are NOT DETECTED.  The SARS-CoV-2 RNA is generally detectable in upper respiratory specimens during the acute phase of infection. The lowest concentration of SARS-CoV-2 viral copies this assay can detect is 138 copies/mL. A negative result does not preclude SARS-Cov-2 infection and should not be used as the sole basis for treatment or other patient management decisions. A negative result may occur with  improper specimen collection/handling, submission of specimen other than nasopharyngeal swab, presence of viral mutation(s) within the areas targeted by this assay, and inadequate number of viral copies(<138 copies/mL). A negative result must be combined with clinical observations, patient history, and epidemiological information. The expected result is Negative.  Fact Sheet for Patients:  bloggercourse.com  Fact Sheet for Healthcare Providers:  seriousbroker.it  This test is no t yet approved or cleared by the [place]  FDA and  has been authorized for detection and/or diagnosis of SARS-CoV-2 by FDA under an Emergency Use Authorization (EUA). This EUA will remain  in effect (meaning this test can be used) for the duration of the COVID-19 declaration under Section 564(b)(1) of the Act, 21 U.S.C.section 360bbb-3(b)(1), unless the authorization is terminated  or revoked sooner.       Influenza A by PCR NEGATIVE NEGATIVE Final   Influenza B by PCR NEGATIVE NEGATIVE Final    Comment: (NOTE) The Xpert Xpress SARS-CoV-2/FLU/RSV plus assay is intended as an aid in the diagnosis of influenza from Nasopharyngeal swab  specimens and should not be used as a sole basis for treatment. Nasal washings and aspirates are unacceptable for Xpert Xpress SARS-CoV-2/FLU/RSV testing.  Fact Sheet for Patients: bloggercourse.com  Fact Sheet for Healthcare Providers: seriousbroker.it  This test is not yet approved or cleared by the [place]  FDA and has been authorized for detection and/or diagnosis of SARS-CoV-2 by FDA under an Emergency Use Authorization (EUA). This EUA will remain in effect (meaning this test can be used) for the duration of the COVID-19 declaration under Section 564(b)(1) of the Act, 21 U.S.C. section 360bbb-3(b)(1), unless the authorization is terminated or revoked.     Resp Syncytial Virus by PCR NEGATIVE NEGATIVE Final    Comment: (NOTE) Fact Sheet for Patients: bloggercourse.com  Fact Sheet for Healthcare Providers: seriousbroker.it  This test is not yet approved or cleared by the [place]  FDA and has been authorized for detection and/or diagnosis of SARS-CoV-2 by FDA under an Emergency Use Authorization (EUA). This EUA will remain in effect (meaning this test can be used) for the duration of the COVID-19 declaration under Section 564(b)(1) of the Act, 21 U.S.C. section 360bbb-3(b)(1), unless the authorization is terminated or revoked.  Performed at [facility], [address] [place], [place] [address]   Culture, blood (routine x  2)     Status: None   Collection Time: 05/12/23 11:45 PM   Specimen: BLOOD  Result Value Ref Range Status   Specimen Description BLOOD RIGHT ANTECUBITAL  Final   Special Requests   Final    BOTTLES DRAWN AEROBIC AND ANAEROBIC Blood Culture adequate volume   Culture   Final    NO GROWTH 5 DAYS Performed at [facility], [address] [place], [place] [address]    Report Status 05/18/2023 FINAL  Final  Culture, blood  (routine x 2)     Status: None   Collection Time: 05/13/23 12:00 AM   Specimen: BLOOD  Result Value Ref Range Status   Specimen Description BLOOD LEFT WRIST  Final   Special Requests   Final    AEROBIC BOTTLE ONLY Blood Culture results may not be optimal due to an inadequate volume of blood received in culture bottles   Culture   Final    NO GROWTH 5 DAYS Performed at [facility], [address], [place], [place] [address]    Report Status 05/18/2023 FINAL  Final  Urine Culture (for pregnant, neutropenic or urologic patients or patients with an indwelling urinary catheter)     Status: Abnormal   Collection Time: 05/13/23 12:10 AM   Specimen: Urine, Clean Catch  Result Value Ref Range Status   Specimen Description   Final    URINE, CLEAN CATCH Performed at [facility], [address] [place], [place] [address]    Special Requests   Final    NONE Performed at [facility], [address], [place], [place] [address]    Culture >=100,000 COLONIES/mL KLEBSIELLA PNEUMONIAE (A)  Final   Report Status 05/15/2023 FINAL  Final   Organism ID, Bacteria KLEBSIELLA PNEUMONIAE (A)  Final      Susceptibility   Klebsiella pneumoniae - MIC*    AMPICILLIN  RESISTANT Resistant     CEFAZOLIN  <=4 SENSITIVE Sensitive     CEFEPIME  <=0.12 SENSITIVE Sensitive     CEFTRIAXONE  <=0.25 SENSITIVE Sensitive     CIPROFLOXACIN <=0.25 SENSITIVE Sensitive     GENTAMICIN  <=1 SENSITIVE Sensitive     IMIPENEM <=0.25 SENSITIVE Sensitive     NITROFURANTOIN <=16 SENSITIVE Sensitive     TRIMETH/SULFA <=20 SENSITIVE Sensitive     AMPICILLIN /SULBACTAM 4 SENSITIVE Sensitive     PIP/TAZO <=4 SENSITIVE Sensitive ug/mL    * >=100,000 COLONIES/mL KLEBSIELLA PNEUMONIAE  MRSA Next Gen by PCR, Nasal     Status: Abnormal   Collection Time: 05/14/23  4:30 PM   Specimen: Nasal Mucosa; Nasal Swab  Result Value Ref Range Status   MRSA by PCR Next Gen DETECTED (A) NOT DETECTED Final    Comment:  RESULT CALLED TO, READ BACK BY AND VERIFIED WITH: DORATHY MUHORO @1908  ON 05/14/23 SKL (NOTE) The GeneXpert MRSA Assay (FDA approved for NASAL specimens only), is one component of a comprehensive MRSA colonization surveillance program. It is not intended to diagnose MRSA infection nor to guide or monitor treatment for MRSA infections. Test performance is not FDA approved in patients less than 27 years old. Performed at [facility], [address] [place], [place] [address]     Coagulation Studies: No results for input(s): LABPROT, INR in the last 72 hours.   Urinalysis: No results for input(s): COLORURINE, LABSPEC, PHURINE, GLUCOSEU, HGBUR, BILIRUBINUR, KETONESUR, PROTEINUR, UROBILINOGEN, NITRITE, LEUKOCYTESUR in the last 72 hours.  Invalid input(s): APPERANCEUR     Imaging: No results found.  Medications:    feeding supplement (NEPRO CARB STEADY) 45 mL/hr at 05/19/23 1836   piperacillin -tazobactam (ZOSYN )  IV 2.25 g (05/21/23 0637)    ascorbic acid   250 mg Oral Daily   atorvastatin   20 mg Oral Daily   vitamin D3  2,000 Units Oral Daily   cholestyramine   4 g Oral TID   epoetin  alfa-epbx (RETACRIT ) injection  4,000 Units Intravenous Q T,Th,Sa-HD   escitalopram   10 mg Oral Daily   famotidine   10 mg Oral Daily   fludrocortisone   0.1 mg Oral Daily   free water   30 mL Per Tube Q4H   heparin  injection (subcutaneous)  5,000 Units Subcutaneous Q8H   hydrOXYzine   50 mg Oral TID   metoCLOPramide   5 mg Oral TID AC & HS   midodrine   15 mg Oral TID with meals   multivitamin  1 tablet Oral QHS   nutrition supplement (JUVEN)  1 packet Oral BID BM   nystatin    Topical TID   mouth rinse  15 mL Mouth Rinse 4 times per day   acetaminophen  **OR** acetaminophen , chlorpheniramine-HYDROcodone, diphenhydrAMINE , ipratropium-albuterol , liver oil-zinc  oxide, loperamide  HCl, LORazepam , magnesium  hydroxide, ondansetron  **OR** ondansetron  (ZOFRAN ) IV,  mouth rinse, traZODone   Assessment/ Plan:  [name] is a 73 y.o.  female with history of hypertension, coronary artery disease, congestive heart failure, COPD, cirrhosis, diabetes, status post left AKA, status post G-tube placement, and end stage renal disease on hemodialysis.  Patient presents to ED from dialysis at the request of family due to altered mental status.  Patient has been admitted for Lower urinary tract infectious disease [N39.0] Altered mental status, unspecified altered mental status type [R41.82] Pneumonia due to infectious organism, unspecified laterality, unspecified part of lung [J18.9] Sepsis due to gram-negative UTI (HCC) [A41.50, N39.0]  [facility]/TTs/Rt permcath/75.6kg  End-stage renal disease: Receiving dialysis today, UF goal 1L as tolerated. Hypotensive, receiving albumin  25g with treatment for blood pressure support. Next treatment scheduled for Saturday.   #2.  Sepsis, gram negative UTI. Received Lactated ringers  in ED. Urine culture with Klebsiella.  Patient currently receiving Zosyn  and vancomycin .  White count continues to improve, 17.9  #3: Anemia of chronic kidney disease Lab Results  Component Value Date   HGB 10.2 (L) 05/21/2023    Hgb within optimal range. Continue low dose EPO with dialysis.    #4: Secondary Hyperparathyroidism: with outpatient labs: PTH 35, phosphorus 7.6, calcium  9.4 on 04/21/23.   Lab Results  Component Value Date   CALCIUM  9.4 05/21/2023   PHOS 4.7 (H) 05/21/2023    Will continue to monitor bone minerals during this admission.     LOS: 8 Norberta Stobaugh 2/6/202510:56 AM

## 2023-05-22 ENCOUNTER — Inpatient Hospital Stay: Payer: Medicare Other

## 2023-05-22 DIAGNOSIS — J9601 Acute respiratory failure with hypoxia: Secondary | ICD-10-CM | POA: Diagnosis not present

## 2023-05-22 DIAGNOSIS — R6520 Severe sepsis without septic shock: Secondary | ICD-10-CM | POA: Diagnosis not present

## 2023-05-22 DIAGNOSIS — J189 Pneumonia, unspecified organism: Secondary | ICD-10-CM | POA: Diagnosis not present

## 2023-05-22 DIAGNOSIS — N390 Urinary tract infection, site not specified: Secondary | ICD-10-CM | POA: Diagnosis not present

## 2023-05-22 DIAGNOSIS — A419 Sepsis, unspecified organism: Secondary | ICD-10-CM | POA: Diagnosis not present

## 2023-05-22 DIAGNOSIS — G9341 Metabolic encephalopathy: Secondary | ICD-10-CM | POA: Diagnosis not present

## 2023-05-22 LAB — CBC
HCT: 30.9 % — ABNORMAL LOW (ref 36.0–46.0)
Hemoglobin: 9.8 g/dL — ABNORMAL LOW (ref 12.0–15.0)
MCH: 26 pg (ref 26.0–34.0)
MCHC: 31.7 g/dL (ref 30.0–36.0)
MCV: 82 fL (ref 80.0–100.0)
Platelets: 412 10*3/uL — ABNORMAL HIGH (ref 150–400)
RBC: 3.77 MIL/uL — ABNORMAL LOW (ref 3.87–5.11)
RDW: 16.3 % — ABNORMAL HIGH (ref 11.5–15.5)
WBC: 18.9 10*3/uL — ABNORMAL HIGH (ref 4.0–10.5)
nRBC: 1.3 % — ABNORMAL HIGH (ref 0.0–0.2)

## 2023-05-22 LAB — HEPATITIS B SURFACE ANTIGEN: Hepatitis B Surface Ag: NONREACTIVE

## 2023-05-22 MED ORDER — ALTEPLASE 2 MG IJ SOLR
2.0000 mg | Freq: Once | INTRAMUSCULAR | Status: AC | PRN
  Administered 2023-05-22: 2 mg

## 2023-05-22 MED ORDER — HEPARIN SODIUM (PORCINE) 1000 UNIT/ML DIALYSIS
1000.0000 [IU] | INTRAMUSCULAR | Status: DC | PRN
  Administered 2023-05-23: 1000 [IU]
  Filled 2023-05-22: qty 1

## 2023-05-22 MED ORDER — ALTEPLASE 2 MG IJ SOLR
INTRAMUSCULAR | Status: AC
  Filled 2023-05-22: qty 4

## 2023-05-22 MED ORDER — STERILE WATER FOR INJECTION IJ SOLN
INTRAMUSCULAR | Status: AC
  Filled 2023-05-22: qty 10

## 2023-05-22 NOTE — TOC Progression Note (Addendum)
 Transition of Care [facility] [place]) - Progression Note    Patient Details  Name: Alexandra Foster MRN: [id number] Date of Birth: [birth year]  Transition of Care [facility]) CM/SW Contact  Tomasa JAYSON Childes, RN Phone Number: 05/22/2023, 11:54 AM  Clinical Narrative:    Attempt to reach patient's spouse to discuss [facility] recommendation. No answer. Lefta message requesting return phone call to [facility].   4:21pm Attempt to contact patiet's daughter. No answer. Left a message.         Expected Discharge Plan and Services                                               Social Determinants of Health (SDOH) Interventions SDOH Screenings   Food Insecurity: No Food Insecurity (05/14/2023)  Housing: Low Risk  (05/14/2023)  Transportation Needs: No Transportation Needs (05/14/2023)  Recent Concern: Transportation Needs - Unmet Transportation Needs (03/10/2023)  Utilities: Not At Risk (05/14/2023)  Alcohol  Screen: Low Risk  (02/11/2021)  Depression (PHQ2-9): Low Risk  (03/20/2023)  Recent Concern: Depression (PHQ2-9) - Medium Risk (02/27/2023)  Financial Resource Strain: Medium Risk (08/09/2020)  Physical Activity: Inactive (08/09/2020)  Social Connections: Moderately Integrated (05/14/2023)  Stress: No Stress Concern Present (08/09/2020)  Tobacco Use: Medium Risk (05/14/2023)    Readmission Risk Interventions    02/16/2023   12:26 PM 12/26/2022   11:15 AM  Readmission Risk Prevention Plan  Transportation Screening Complete Complete  PCP or Specialist Appt within 3-5 Days Complete   HRI or Home Care Consult Complete   Social Work Consult for Recovery Care Planning/Counseling Complete   Palliative Care Screening Not Applicable   Medication Review Oceanographer) Complete Complete  PCP or Specialist appointment within 3-5 days of discharge  Complete  HRI or Home Care Consult  Complete  SW Recovery Care/Counseling Consult  Complete  Palliative Care Screening  Not Applicable  Skilled Nursing  Facility  Not Applicable

## 2023-05-22 NOTE — Progress Notes (Signed)
 PT Cancellation Note  Patient Details Name: Alexandra Foster MRN: [id number] DOB: [birth year]   Cancelled Treatment:    Reason Eval/Treat Not Completed: Other (comment). TA x 2. On first attempt with OT, pt reports she doesn't want to perform OOB mobility. On 2nd attempt, pt having active and frequent diarrhea and RN in room for assistance and asked to delay session. Will return at more convenient time.   Lashea Goda 05/22/2023, 2:31 PM Corean Dade, PT, DPT, GCS [phone]

## 2023-05-22 NOTE — Progress Notes (Signed)
 [facility]  ROUNDING NOTE   Subjective:   Alexandra Foster is a 73 y.o. female with history of hypertension, coronary artery disease, congestive heart failure, COPD, cirrhosis, diabetes, status post left AKA, status post G-tube placement, and end stage renal disease on hemodialysis.  Patient presents to ED from dialysis at the request of family due to altered mental status.  Patient has been admitted for Lower urinary tract infectious disease [N39.0] Altered mental status, unspecified altered mental status type [R41.82] Pneumonia due to infectious organism, unspecified laterality, unspecified part of lung [J18.9] Sepsis due to gram-negative UTI (HCC) [A41.50, N39.0]  Patient is known to our practice from previous admissions and receives outpatient dialysis treatments at [facility] on a TTS schedule, supervised by [facility].    Update  Patient seen laying in bed No family present Alert, continues to complain of itching  Tube feeds in place Dialysis received yesterday  Objective:  Vital signs in last 24 hours:  Temp:  [98.2 F (36.8 C)-99.1 F (37.3 C)] 98.5 F (36.9 C) (02/07 0918) Pulse Rate:  [63-70] 70 (02/07 0918) Resp:  [18-20] 18 (02/07 0918) BP: (100-108)/(53-55) 100/54 (02/07 0918) SpO2:  [99 %-100 %] 99 % (02/07 0918) Weight:  [81.3 kg] 81.3 kg (02/07 0500)  Weight change: -0.6 kg Filed Weights   05/21/23 0738 05/21/23 1141 05/22/23 0500  Weight: 81.6 kg 80.3 kg 81.3 kg    Intake/Output: I/O last 3 completed shifts: In: 510 [NG/GT:510] Out: 1300 [Other:1300]   Intake/Output this shift:  No intake/output data recorded.  Physical Exam: General: NAD  Head: Normocephalic, atraumatic. Moist oral mucosal membranes  Eyes: Anicteric  Lungs:  Clear to auscultation, normal effort  Heart: Regular rate and rhythm  Abdomen:  Soft, nontender, obese  Extremities:  trace peripheral edema.  Neurologic: Alert and oriented to self, moving  all four extremities  Skin: No lesions, dry, flaky  Access: Rt chest permcath     Basic Metabolic Panel: Recent Labs  Lab 05/16/23 0414 05/17/23 0623 05/19/23 0653 05/21/23 0636  NA 137 135 133* 135  K 3.4* 3.2* 3.7 3.5  CL 101 98 97* 97*  CO2 23 24 19* 22  GLUCOSE 126* 95 122* 95  BUN 48* 43* 105* 78*  CREATININE 4.70* 3.97* 5.87* 4.45*  CALCIUM  9.2 8.7* 9.3 9.4  PHOS  --   --   --  4.7*    Liver Function Tests: Recent Labs  Lab 05/19/23 0805 05/21/23 0636  ALBUMIN  2.2* 2.1*   No results for input(s): LIPASE, AMYLASE in the last 168 hours. No results for input(s): AMMONIA in the last 168 hours.  CBC: Recent Labs  Lab 05/16/23 0414 05/17/23 0623 05/19/23 0653 05/20/23 0546 05/21/23 0636 05/22/23 0516  WBC 29.1* 26.3* 30.1* 18.8* 17.9* 18.9*  NEUTROABS 17.2*  --   --   --   --   --   HGB 10.9* 9.9* 9.7* 10.2* 10.2* 9.8*  HCT 35.0* 30.8* 29.9* 32.9* 32.4* 30.9*  MCV 83.3 81.7 81.3 83.1 82.4 82.0  PLT 296 176 324 331 391 412*    Cardiac Enzymes: No results for input(s): CKTOTAL, CKMB, CKMBINDEX, TROPONINI in the last 168 hours.  BNP: Invalid input(s): POCBNP  CBG: Recent Labs  Lab 05/15/23 1938 05/15/23 2358 05/16/23 0443  GLUCAP 141* 110* 133*    Microbiology: Results for orders placed or performed during the hospital encounter of 05/13/23  Resp panel by RT-PCR (RSV, Flu A&B, Covid) Anterior Nasal Swab     Status:  None   Collection Time: 05/12/23  4:37 PM   Specimen: Anterior Nasal Swab  Result Value Ref Range Status   SARS Coronavirus 2 by RT PCR NEGATIVE NEGATIVE Final    Comment: (NOTE) SARS-CoV-2 target nucleic acids are NOT DETECTED.  The SARS-CoV-2 RNA is generally detectable in upper respiratory specimens during the acute phase of infection. The lowest concentration of SARS-CoV-2 viral copies this assay can detect is 138 copies/mL. A negative result does not preclude SARS-Cov-2 infection and should not be used as the  sole basis for treatment or other patient management decisions. A negative result may occur with  improper specimen collection/handling, submission of specimen other than nasopharyngeal swab, presence of viral mutation(s) within the areas targeted by this assay, and inadequate number of viral copies(<138 copies/mL). A negative result must be combined with clinical observations, patient history, and epidemiological information. The expected result is Negative.  Fact Sheet for Patients:  bloggercourse.com  Fact Sheet for Healthcare Providers:  seriousbroker.it  This test is no t yet approved or cleared by the [place]  FDA and  has been authorized for detection and/or diagnosis of SARS-CoV-2 by FDA under an Emergency Use Authorization (EUA). This EUA will remain  in effect (meaning this test can be used) for the duration of the COVID-19 declaration under Section 564(b)(1) of the Act, 21 U.S.C.section 360bbb-3(b)(1), unless the authorization is terminated  or revoked sooner.       Influenza A by PCR NEGATIVE NEGATIVE Final   Influenza B by PCR NEGATIVE NEGATIVE Final    Comment: (NOTE) The Xpert Xpress SARS-CoV-2/FLU/RSV plus assay is intended as an aid in the diagnosis of influenza from Nasopharyngeal swab specimens and should not be used as a sole basis for treatment. Nasal washings and aspirates are unacceptable for Xpert Xpress SARS-CoV-2/FLU/RSV testing.  Fact Sheet for Patients: bloggercourse.com  Fact Sheet for Healthcare Providers: seriousbroker.it  This test is not yet approved or cleared by the [place]  FDA and has been authorized for detection and/or diagnosis of SARS-CoV-2 by FDA under an Emergency Use Authorization (EUA). This EUA will remain in effect (meaning this test can be used) for the duration of the COVID-19 declaration under Section 564(b)(1) of the  Act, 21 U.S.C. section 360bbb-3(b)(1), unless the authorization is terminated or revoked.     Resp Syncytial Virus by PCR NEGATIVE NEGATIVE Final    Comment: (NOTE) Fact Sheet for Patients: bloggercourse.com  Fact Sheet for Healthcare Providers: seriousbroker.it  This test is not yet approved or cleared by the [place]  FDA and has been authorized for detection and/or diagnosis of SARS-CoV-2 by FDA under an Emergency Use Authorization (EUA). This EUA will remain in effect (meaning this test can be used) for the duration of the COVID-19 declaration under Section 564(b)(1) of the Act, 21 U.S.C. section 360bbb-3(b)(1), unless the authorization is terminated or revoked.  Performed at [facility], [address] [place], [place] [address]   Culture, blood (routine x 2)     Status: None   Collection Time: 05/12/23 11:45 PM   Specimen: BLOOD  Result Value Ref Range Status   Specimen Description BLOOD RIGHT ANTECUBITAL  Final   Special Requests   Final    BOTTLES DRAWN AEROBIC AND ANAEROBIC Blood Culture adequate volume   Culture   Final    NO GROWTH 5 DAYS Performed at [facility], [address], [place], [place] [address]    Report Status 05/18/2023 FINAL  Final  Culture, blood (routine x 2)  Status: None   Collection Time: 05/13/23 12:00 AM   Specimen: BLOOD  Result Value Ref Range Status   Specimen Description BLOOD LEFT WRIST  Final   Special Requests   Final    AEROBIC BOTTLE ONLY Blood Culture results may not be optimal due to an inadequate volume of blood received in culture bottles   Culture   Final    NO GROWTH 5 DAYS Performed at [facility], [address], [place], [place] [address]    Report Status 05/18/2023 FINAL  Final  Urine Culture (for pregnant, neutropenic or urologic patients or patients with an indwelling urinary catheter)     Status: Abnormal   Collection Time:  05/13/23 12:10 AM   Specimen: Urine, Clean Catch  Result Value Ref Range Status   Specimen Description   Final    URINE, CLEAN CATCH Performed at [facility], [address], [place], [place] [address]    Special Requests   Final    NONE Performed at [facility], [address], [place], [place] [address]    Culture >=100,000 COLONIES/mL KLEBSIELLA PNEUMONIAE (A)  Final   Report Status 05/15/2023 FINAL  Final   Organism ID, Bacteria KLEBSIELLA PNEUMONIAE (A)  Final      Susceptibility   Klebsiella pneumoniae - MIC*    AMPICILLIN  RESISTANT Resistant     CEFAZOLIN  <=4 SENSITIVE Sensitive     CEFEPIME  <=0.12 SENSITIVE Sensitive     CEFTRIAXONE  <=0.25 SENSITIVE Sensitive     CIPROFLOXACIN <=0.25 SENSITIVE Sensitive     GENTAMICIN  <=1 SENSITIVE Sensitive     IMIPENEM <=0.25 SENSITIVE Sensitive     NITROFURANTOIN <=16 SENSITIVE Sensitive     TRIMETH/SULFA <=20 SENSITIVE Sensitive     AMPICILLIN /SULBACTAM 4 SENSITIVE Sensitive     PIP/TAZO <=4 SENSITIVE Sensitive ug/mL    * >=100,000 COLONIES/mL KLEBSIELLA PNEUMONIAE  MRSA Next Gen by PCR, Nasal     Status: Abnormal   Collection Time: 05/14/23  4:30 PM   Specimen: Nasal Mucosa; Nasal Swab  Result Value Ref Range Status   MRSA by PCR Next Gen DETECTED (A) NOT DETECTED Final    Comment: RESULT CALLED TO, READ BACK BY AND VERIFIED WITH: DORATHY MUHORO @1908  ON 05/14/23 SKL (NOTE) The GeneXpert MRSA Assay (FDA approved for NASAL specimens only), is one component of a comprehensive MRSA colonization surveillance program. It is not intended to diagnose MRSA infection nor to guide or monitor treatment for MRSA infections. Test performance is not FDA approved in patients less than 95 years old. Performed at [facility], [address] [place], [place] [address]     Coagulation Studies: No results for input(s): LABPROT, INR in the last 72 hours.   Urinalysis: No results for input(s):  COLORURINE, LABSPEC, PHURINE, GLUCOSEU, HGBUR, BILIRUBINUR, KETONESUR, PROTEINUR, UROBILINOGEN, NITRITE, LEUKOCYTESUR in the last 72 hours.  Invalid input(s): APPERANCEUR     Imaging: No results found.    Medications:    feeding supplement (NEPRO CARB STEADY) 45 mL/hr at 05/19/23 1836   heparin  sodium (porcine)      ascorbic acid   250 mg Oral Daily   atorvastatin   20 mg Oral Daily   vitamin D3  2,000 Units Oral Daily   cholestyramine   4 g Oral TID   epoetin  alfa-epbx (RETACRIT ) injection  4,000 Units Intravenous Q T,Th,Sa-HD   escitalopram   10 mg Oral Daily   famotidine   10 mg Oral Daily   fludrocortisone   0.1 mg Oral Daily   free water   30 mL Per Tube Q4H   heparin  injection (subcutaneous)  5,000 Units Subcutaneous Q8H   hydrOXYzine   50 mg Oral TID   metoCLOPramide   5 mg Oral TID AC & HS   midodrine   15 mg Oral TID with meals   multivitamin  1 tablet Oral QHS   nutrition supplement (JUVEN)  1 packet Oral BID BM   nystatin    Topical TID   mouth rinse  15 mL Mouth Rinse 4 times per day   acetaminophen  **OR** acetaminophen , chlorpheniramine-HYDROcodone, diphenhydrAMINE , heparin  sodium (porcine), ipratropium-albuterol , liver oil-zinc  oxide, loperamide  HCl, LORazepam , magnesium  hydroxide, ondansetron  **OR** ondansetron  (ZOFRAN ) IV, mouth rinse, traZODone   Assessment/ Plan:  Ms. Maleiya Pergola is a 73 y.o.  female with history of hypertension, coronary artery disease, congestive heart failure, COPD, cirrhosis, diabetes, status post left AKA, status post G-tube placement, and end stage renal disease on hemodialysis.  Patient presents to ED from dialysis at the request of family due to altered mental status.  Patient has been admitted for Lower urinary tract infectious disease [N39.0] Altered mental status, unspecified altered mental status type [R41.82] Pneumonia due to infectious organism, unspecified laterality, unspecified part of lung [J18.9] Sepsis  due to gram-negative UTI (HCC) [A41.50, N39.0]  [facility] [place]/TTs/Rt permcath/75.6kg  End-stage renal disease: Patient received dialysis yesterday, UF 1.3 L achieved.  Albumin  utilized to optimize fluid removal.  Next treatment scheduled for Saturday.  #2.  Sepsis, gram negative UTI. Received Lactated ringers  in ED. Urine culture with Klebsiella.  Completed antibiotic regimen. White count appears elevated but stable, 18.9.  Peaked at 30.1.  #3: Anemia of chronic kidney disease Lab Results  Component Value Date   HGB 9.8 (L) 05/22/2023    Continue low dose EPO with dialysis.    #4: Secondary Hyperparathyroidism: with outpatient labs: PTH 35, phosphorus 7.6, calcium  9.4 on 04/21/23.   Lab Results  Component Value Date   CALCIUM  9.4 05/21/2023   PHOS 4.7 (H) 05/21/2023    Calcium  and phosphorus within desired range.  Will continue to monitor    LOS: 9 Arah Aro 2/7/202512:50 PM

## 2023-05-22 NOTE — Progress Notes (Addendum)
 Progress Note   Patient: Alexandra Foster FMW:[id number] DOB: [birth year] DOA: 05/13/2023     9 DOS: the patient was seen and examined on 05/22/2023   Brief hospital course: 73 year old female with end-stage renal disease on hemodialysis, osteoarthritis, asthma, COPD, type 2 diabetes mellitus, GERD, hypertension and paroxysmal atrial fibrillation and obstructive sleep apnea on CPAP presented to the ER with confusion over the last 3 days with intermittent vomiting.  She was admitted with clinical sepsis secondary to right upper lobe pneumonia likely aspiration and urinary infection.  Started on antibiotic.  1/29.  Patient has some cough and shortness of breath.  Does not wear oxygen at home.  Blood pressure very high in the right arm and very low in the left arm.  CT angio of the left arm does not show any blockages.  May have a steal from the right arm with a catheter in the right chest.  Feeding tube confirmed then placed last night and can restart tube feeds.  Had a fever of 103. 1/30.  Patient asking for water .  Patient gets all of her nutrition through her tube. 1/31.  Patient complains of dry mouth. 2/1.  Called to rapid response at dialysis.  Patient had large brown BM then was less responsive and has shortness of breath.  Bipap ordered.  ABG.  Iv albumin .  Spoke with nephrology to remove fluid.  CT scan showing worsening pneumonia.  Antibiotics switched to Zosyn  and vancomycin  2/2.  Respiratory status much improved from yesterday.  Off BiPAP and on 3 L of oxygen.  Will try to taper oxygen.  Blood pressure on the lower side will increase midodrine  to 15 mg 3 times daily. 2/3.  This morning was down to 1 L asked nursing staff to try to taper off.  As needed Imodium  for diarrhea.  Steroids discontinued. 2/4.  White blood cell count 30.1.  Diarrhea not documented.  I assumed care 05/20/23 WBC improved to 18.8k Pt remained on IV Zosyn  through 2/6, stopped having completed total of 10 days  antibiotic coverage.    Assessment and Plan: * Severe sepsis (HCC) Present on admission with leukocytosis fever tachycardia and tachypnea and acute respiratory failure.  Patient has right upper lobe pneumonia and Klebsiella growing out of urine culture.  WBC climbed to peak 30.1 (note pt was also on steroids). Antibiotics switched on 2/1 to Zosyn .  Will stop antibiotics -- 10 days completed. Off steroids since 2/3. Monitor fever curve, CBC and clinically for s/sx's of infection  2/7 -- check repeat chest xray and blood cultures.  Requested ID's input on persistent leukocytosis.  Acute hypoxic respiratory failure (HCC) With respiratory distress on 2/1 was placed on BiPAP.  Last pulse ox on room air.  This problem has resolved.  Hypotension On midodrine  and florinef  for chronically low blood pressure.  Increased midodrine  to 15 mg 3 times daily.  Aspiration pneumonia (HCC) Multifocal pneumonia seen on CT scan.  Stop Zosyn . Completed total of 10 days antibiotics   Acute metabolic encephalopathy Mental status improved.  ESRD on hemodialysis [facility]) Dialysis per Nephrology  Dyslipidemia On Lipitor  Acute on chronic diastolic CHF (congestive heart failure) (HCC) Likely more worsening pneumonia rather than heart failure.  Dialysis to manage fluid.  Hyperkalemia Improved with dialysis  Urinary tract infection due to Klebsiella species Treated with antibiotics  Pneumonia due to infectious organism See aspiration pneumonia  Itching On atarax  standing dose and as needed Benadryl   Stage II decubitus ulcer (HCC) Buttocks, see full description  below, documented on 1/30.  Diarrhea As needed Imodium  and standing dose cholestyramine .  Obesity (BMI 30-39.9) BMI down to 29.71  Anxiety and depression On Klonopin  and Lexapro .        Subjective: Pt awake resting in bed this AM. She denies complaints including fever/chills, malaise, sore throat, congestion.  Has ongoing cough  recovering from aspiration pneumonia.  No abdominal pain, nausea vomiting or diarrhea.  Pt states hopes to go home tomorrow.  States she feels strong enough to manage at home with family's help.   Physical Exam: Vitals:   05/22/23 0351 05/22/23 0500 05/22/23 0918 05/22/23 1318  BP: (!) 108/55  (!) 100/54 (!) 97/48  Pulse: 67  70 69  Resp: 20  18   Temp: 99.1 F (37.3 C)  98.5 F (36.9 C) 98.6 F (37 C)  TempSrc:   Oral Axillary  SpO2: 100%  99% 99%  Weight:  81.3 kg    Height:       General exam: awake, alert, no acute distress, chronically ill appearing HEENT: moist mucus membranes, hearing grossly normal  Respiratory system: lungs clear diminished bases due to poor inspiratory volumes, no wheezes, normal respiratory effort on 3 L/min [place] o2. Cardiovascular system: normal S1/S2, RRR, perm cath right upper chest accessed for dialysis Gastrointestinal system: soft, NT, ND Central nervous system: A&O x 2+. no gross focal neurologic deficits, normal speech Skin: dry flaky skin, normal temperature Psychiatry: normal mood, flat affect      Data Reviewed: Notable labs --  Cl 97 BUN 78 Cr 4.45 Gap 16 Phos 4.7 Albumin  2.1  WBC improved 30.1 >> 18.8 >> 17.9 Hbg stable 10.2 >> 10.2     Family Communication: Daughter Gorman updated by phone today.  Updated on clinical status and current management, monitoring off antibiotics after getting 10 days of broad coverage.  Gorman states she is not coming home until her white count is normal.  Discussed repeat chest xray and cultures, but if no infection found labs can be monitored as outpatient.  She continued to insist patient should be kept in hospital until WBC normal.     Disposition: Status is: Inpatient Remains inpatient appropriate because: monitoring leukocytosis and clinically for s/sx's of infection off antibiotics since afternoon 2/6.  Possible d/c tomorrow if clinically stable and no acute inpatient medical needs are  identified in the meantime.    Planned Discharge Destination: Home with Home Health    Time spent: 52 minutes including time at bedside and in coordination of care with staff and consultants, and family  Author: Burnard DELENA Cunning, DO 05/22/2023 1:48 PM  For on call review [email or web address].

## 2023-05-22 NOTE — Progress Notes (Signed)
 Occupational Therapy Treatment Patient Details Name: Alexandra Foster MRN: [id number] DOB: 10/25/1950 Today's Date: 05/22/2023   History of present illness Pt is a 73 y.o. female presenting to hospital 05/12/23 with c/o AMS; vomited at dialysis.  Pt admitted with sepsis d/t gram-negative UTI, aspiration PNA, and acute metabolic encephalopathy.  PMH includes ESRD on HD, OA, asthma, COPD, DM type 2, htn, paroxysmal a-fib, OSA on CPAP, trach, L AKA.   OT comments  Pt sleeping upon OT arrival, awakens briefly to name and returns to sleep. Pt lethargic, refusing to attempt OOB transfers with hoyer (when asked, pt stating she only is OOB at home for transport to MD appts). With max encouragement, pt performing UE arm raises and assists minimally with repositioning/PROM of RLE. MaxA to wash face bed level. Will trial 1x more attempt to facilitate increased participation for ADL performance to decrease caregiver burden. Discharge recommendation appropriate. Anticipate the need for follow up [facility] OT services upon acute hospital DC.       If plan is discharge home, recommend the following:  Two people to help with bathing/dressing/bathroom;Two people to help with walking and/or transfers   Equipment Recommendations  None recommended by OT    Recommendations for Other Services Other (comment)    Precautions / Restrictions Precautions Precautions: Fall Precaution Comments: PEG tube; R IJ perm cath Restrictions Weight Bearing Restrictions Per Provider Order: No Other Position/Activity Restrictions: L AKA       Mobility Bed Mobility               General bed mobility comments: Pt refusing all mobility    Transfers                   General transfer comment: Pt refused transfer to chair with hoyer     Balance                                           ADL either performed or assessed with clinical judgement   ADL Overall ADL's : Needs assistance/impaired      Grooming: Maximal assistance;Bed level Grooming Details (indicate cue type and reason): max encourgement to wash face, pt states you do it and OT provides edu regarding importance of maintaining strength in UE. Pt washes face briefly before returning back to sleep                                      Cognition Arousal: Lethargic Behavior During Therapy: Flat affect Overall Cognitive Status: No family/caregiver present to determine baseline cognitive functioning                                 General Comments: lethargic, pt opens eyes to name but responds minimally. states no when asked if she would get OOB using hoyer lift. max encourgment to participate washing face        Exercises Exercises: Other exercises Other Exercises Other Exercises: Pt peforming minimal UE exercises (reaching BUE up x2, and assisting with BLE ROM minimally)       General Comments Skin dry and flaking throughout, oil in room    Pertinent Vitals/ Pain       Pain Assessment Pain Assessment: No/denies pain   Frequency  Min 1X/week        Progress Toward Goals  OT Goals(current goals can now be found in the care plan section)  Progress towards OT goals: OT to reassess next treatment  Acute Rehab OT Goals OT Goal Formulation: With patient/family Time For Goal Achievement: 06/03/23 Potential to Achieve Goals: Fair ADL Goals Pt Will Perform Grooming: with min assist;bed level Pt/caregiver will Perform Home Exercise Program: Increased strength;Both right and left upper extremity;With theraband;With minimal assist;With written HEP provided  Plan         AM-PAC OT 6 Clicks Daily Activity     Outcome Measure   Help from another person eating meals?: Total Help from another person taking care of personal grooming?: Total Help from another person toileting, which includes using toliet, bedpan, or urinal?: Total Help from another person bathing (including  washing, rinsing, drying)?: Total Help from another person to put on and taking off regular upper body clothing?: Total Help from another person to put on and taking off regular lower body clothing?: Total 6 Click Score: 6    End of Session    OT Visit Diagnosis: Other abnormalities of gait and mobility (R26.89);Muscle weakness (generalized) (M62.81)   Activity Tolerance Patient limited by fatigue   Patient Left in bed;with call bell/phone within reach;with bed alarm set   Nurse Communication Mobility status        Time: 8896-8887 OT Time Calculation (min): 9 min  Charges: OT General Charges $OT Visit: 1 Visit OT Treatments $Self Care/Home Management : 8-22 mins  Desira Alessandrini L. Mckaylin Bastien, OTR/L  05/22/23, 11:26 AM

## 2023-05-22 NOTE — Consult Note (Signed)
 NAME: Alexandra Foster  DOB: [birth year]  MRN: [id number]  Date/Time: 05/22/2023 3:40 PM  REQUESTING PROVIDER: Dr. Burnard   REASON FOR CONSULT: Leukocytosis ? Alexandra Foster is a 73 y.o. 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 As per the ED chart and the family the patient has had 3-day history of increasing confusion.  She also had intermittent vomiting.  She has past history of aspiration pneumonia as well as UTI. In the ED vitals were BP of 138/116, pulse 93, respiratory rate 18, temperature 97.6  Labs revealed WBC of 20.8, Hb 14.3, platelet 353 , lactic acid of 2.3, CO2 of 21, glucose of 142, BUN 26, creatinine 3.61, alk phos of 198, albumin  of 3.2, protein of 8.9 EKG showed normal sinus rhythm Chest x-ray showed hazy opacities over the right upper lobe patient was started on IV cefepime  and Vanco after blood cultures were sent. Patient was switched to Rocephin  and Zithromax  for community-acquired pneumonia.  She also had Klebsiella in the urine.  There was a concern for aspiration pneumonia. As leukocytosis was still elevated with MRSA PCR positive the Zithromax  was switched to doxycycline  and ceftriaxone  was continued on 05/15/2023. Alexandra Foster  Patient had a vasovagal episode after the bowel movement on 05/16/2023.The Doxy was switched to vancomycin  on  05/16/2023.  CT scan showed worsening pneumonia and hence ceftriaxone  was switched to Zosyn  on 05/16/2023..  On 05/19/2023 the blood white blood count was 30 and the steroids which she was on before was to have been discontinued on 05/18/2023. On 05/20/2023 the  WBC was 18.8 the patient remained on Zosyn .  Antibiotics was discontinued on 05/21/2023. I am asked to see the patient asWhite count still remains high and the daughter was upset that the antibiotics were stopped.  The antibiotic history this admission 05/13/2023 1 dose of cefepime  and 1 dose of vancomycin .  05/13/2023 until 05/16/2023 ceftriaxone  05/13/2023 until 05/15/2023 azithromycin  05/15/2023 until 03/14/2024 doxycycline  05/16/2023 until 05/19/2023 vancomycin  05/16/2023 until 05/21/2023 Zosyn     Past Medical History:  Diagnosis Date   Adrenal insufficiency (HCC) 02/13/2023   AKI (acute kidney injury) (HCC)    a. 04/2021 in setting of bacteremia/shock.   Arthritis    Asthma    Bacteremia    a. 04/2021 S pyogenes bacteremia in setting of lower ext cellulitis.   COPD (chronic obstructive pulmonary disease) (HCC)    Diabetes mellitus without complication (HCC)    Endometriosis    GERD (gastroesophageal reflux disease)    History of echocardiogram    a. 07/2013 Echo: EF 55-60%, impaired relaxation, mild TR; b. 04/2021 Echo: EF 50-55%, mild LVH, nl RV fxn, mild BAE, Ao sclerosis w/o stenosis.   Hypertension    Obesity    PAF (paroxysmal atrial fibrillation) (HCC)    a. 04/2021 in setting of septic shock/cellulitis.   Sleep apnea    CPAP    Past Surgical History:  Procedure Laterality Date   ABDOMINAL HYSTERECTOMY     APPLICATION OF WOUND VAC Left 05/17/2021   Procedure: APPLICATION OF WOUND VAC/WOUND VAC EXCHANGE-Matrix Myriad;  Surgeon: Rodolph Romano, MD;  Location: [facility] ORS;  Service: General;  Laterality: Left;   APPLICATION OF WOUND  [facility]  05/24/2021   Procedure: APPLICATION OF WOUND VAC;  Surgeon: Rodolph Romano, MD;  Location: [facility] OR[facility];  Service: General;;   APPLICATION OF WOUND VAC  05/10/2021   Procedure: APPLICATION OF WOUND VAC;  Surgeon: Rodolph Romano, MD;  Location: [facility];  Service: General;;   COLONOSCOPY  10/29/2006   Dr Jinny   COLONOSCOPY WITH PROPOFOL  N/A  11/05/2016   Procedure: COLONOSCOPY WITH PROPOFOL ;  Surgeon: Dessa Reyes ORN, MD;  Location: [facility] ENDOSCOPY;  Service: Endoscopy;  Laterality: N/A;   DIALYSIS/PERMA CATHETER INSERTION N/A 12/19/2022   Procedure: DIALYSIS/PERMA CATHETER INSERTION;  Surgeon: Marea Selinda RAMAN, MD;  Location: [facility] INVASIVE CV LAB;  Service: Cardiovascular;  Laterality: N/A;   DIALYSIS/PERMA CATHETER INSERTION N/A 02/04/2023   Procedure: DIALYSIS/PERMA CATHETER INSERTION;  Surgeon: Marea Selinda RAMAN, MD;  Location: [facility];  Service: Cardiovascular;  Laterality: N/A;   DIALYSIS/PERMA CATHETER INSERTION N/A 02/16/2023   Procedure: DIALYSIS/PERMA CATHETER INSERTION;  Surgeon: Marea Selinda RAMAN, MD;  Location: [facility];  Service: Cardiovascular;  Laterality: N/A;   DIALYSIS/PERMA CATHETER INSERTION N/A 03/23/2023   Procedure: DIALYSIS/PERMA CATHETER INSERTION;  Surgeon: Marea Selinda RAMAN, MD;  Location: [facility];  Service: Cardiovascular;  Laterality: N/A;   INCISION AND DRAINAGE OF WOUND Left 05/24/2021   Procedure: IRRIGATION AND DEBRIDEMENT LEFT LEG;  Surgeon: Rodolph Romano, MD;  Location: [facility];  Service: General;  Laterality: Left;   INCISION AND DRAINAGE OF WOUND Left 05/10/2021   Procedure: IRRIGATION AND DEBRIDEMENT LEFT LEG;  Surgeon: Rodolph Romano, MD;  Location: [facility];  Service: General;  Laterality: Left;   IR FLUORO GUIDE CV LINE RIGHT  06/12/2021   IR PERC TUN PERIT CATH WO PORT S&I /IMAG  06/12/2021   IR RADIOLOGIST EVAL & MGMT  03/04/2023   IR REPLC GASTRO/COLONIC TUBE PERCUT W/FLUORO  06/12/2021   IVC FILTER INSERTION N/A 05/29/2021   Procedure: IVC FILTER INSERTION;  Surgeon: Jama Cordella MATSU, MD;  Location: [facility];  Service: Cardiovascular;  Laterality: N/A;   MINOR GRAFT APPLICATION  05/24/2021   Procedure: Myriad Matrix  APPLICATION;  Surgeon: Rodolph Romano, MD;  Location: [facility];  Service: General;;   NASAL SINUS SURGERY  2002   Dr Gretta    PEG PLACEMENT N/A 05/28/2021   Procedure: PERCUTANEOUS ENDOSCOPIC GASTROSTOMY (PEG) PLACEMENT;  Surgeon: Tye Millet, DO;  Location: [facility] ENDOSCOPY;  Service: General;  Laterality: N/A;  TRAVEL CASE   TRACHEOSTOMY TUBE PLACEMENT N/A 05/17/2021   Procedure: TRACHEOSTOMY;  Surgeon: Blair Mt, MD;  Location: [facility];  Service: ENT;  Laterality: N/A;   WOUND DEBRIDEMENT Left 05/07/2021   Procedure: DEBRIDEMENT WOUND;  Surgeon: Rodolph Romano, MD;  Location: [facility];  Service: General;  Laterality: Left;    Social History   Socioeconomic History   Marital status: Married    Spouse name: Seena   Number of children: 1   Years of education: 12   Highest education level: 12th grade  Occupational History    Employer: RETIRED  Tobacco Use   Smoking status: Former    Current packs/day: 0.00    Average packs/day: 2.0 packs/day for 40.0 years (80.0 ttl pk-yrs)    Types: Cigarettes    Start date: 1963    Quit date: 2003    Years since quitting: 22.1   Smokeless tobacco: Former    Types: Snuff    Quit date: 04/2001   Tobacco comments:    smoking cessation materials not required  Vaping Use  Vaping status: Never Used  Substance and Sexual Activity   Alcohol  use: No    Alcohol /week: 0.0 standard drinks of alcohol    Drug use: No   Sexual activity: Not Currently  Other Topics Concern   Not on file  Social History Narrative   Not on file   Social Drivers of Health   Financial Resource Strain: Medium Risk (08/09/2020)   Overall Financial Resource Strain (CARDIA)    Difficulty of Paying Living Expenses: Somewhat hard  Food Insecurity: No Food Insecurity (05/14/2023)   Hunger Vital Sign    Worried About Running Out of Food in the Last Year: Never true    Ran Out of Food in the Last Year: Never true  Transportation Needs: No Transportation Needs (05/14/2023)   PRAPARE - Administrator, Civil Service (Medical): No    Lack of Transportation (Non-Medical): No  Recent  Concern: Transportation Needs - Unmet Transportation Needs (03/10/2023)   PRAPARE - Transportation    Lack of Transportation (Medical): Yes    Lack of Transportation (Non-Medical): Yes  Physical Activity: Inactive (08/09/2020)   Exercise Vital Sign    Days of Exercise per Week: 0 days    Minutes of Exercise per Session: 0 min  Stress: No Stress Concern Present (08/09/2020)   [employer] of Occupational Health - Occupational Stress Questionnaire    Feeling of Stress : Not at all  Social Connections: Moderately Integrated (05/14/2023)   Social Connection and Isolation Panel [NHANES]    Frequency of Communication with Friends and Family: More than three times a week    Frequency of Social Gatherings with Friends and Family: Once a week    Attends Religious Services: 1 to 4 times per year    Active Member of [employer] or Organizations: No    Attends Banker Meetings: Never    Marital Status: Married  Catering Manager Violence: Not At Risk (05/14/2023)   Humiliation, Afraid, Rape, and Kick questionnaire    Fear of Current or Ex-Partner: No    Emotionally Abused: No    Physically Abused: No    Sexually Abused: No    Family History  Problem Relation Age of Onset   Congestive Heart Failure Mother    Coronary artery disease Father 55   Breast cancer Sister 44   Heart disease Brother    Varicose Veins Brother    Alcohol  abuse Brother    Allergies  Allergen Reactions   Chlorhexidine  Dermatitis   Augmentin  [Amoxicillin -Pot Clavulanate] Dermatitis   I? Current Facility-Administered Medications  Medication Dose Route Frequency Provider Last Rate Last Admin   acetaminophen  (TYLENOL ) tablet 650 mg  650 mg Oral Q6H PRN Meegan, Eryn, RPH   650 mg at 05/19/23 0115   Or   acetaminophen  (TYLENOL ) suppository 650 mg  650 mg Rectal Q6H PRN Meegan, Eryn, RPH       ascorbic acid  (VITAMIN C ) tablet 250 mg  250 mg Oral Daily Meegan, Eryn, RPH   250 mg at 05/22/23 0913   atorvastatin   (LIPITOR) tablet 20 mg  20 mg Oral Daily Meegan, Eryn, RPH   20 mg at 05/22/23 0910   chlorpheniramine-HYDROcodone (TUSSIONEX) 10-8 MG/5ML suspension 5 mL  5 mL Oral Q12H PRN Meegan, Eryn, RPH       cholecalciferol  (VITAMIN D3) 25 MCG (1000 UNIT) tablet 2,000 Units  2,000 Units Oral Daily Meegan, Eryn, RPH   2,000 Units at 05/22/23 0910   cholestyramine  (QUESTRAN ) packet 4 g  4 g Oral TID  Meegan, Eryn, RPH   4 g at 05/22/23 0914   diphenhydrAMINE  (BENADRYL ) 12.5 MG/5ML elixir 12.5 mg  12.5 mg Oral Q8H PRN Madueme, Elvira C, RPH   12.5 mg at 05/22/23 1246   epoetin  alfa-epbx (RETACRIT ) injection 4,000 Units  4,000 Units Intravenous Q T,Th,Sa-HD Druscilla Bald, NP   4,000 Units at 05/21/23 0944   escitalopram  (LEXAPRO ) tablet 10 mg  10 mg Oral Daily Meegan, Eryn, RPH   10 mg at 05/22/23 0913   famotidine  (PEPCID ) tablet 10 mg  10 mg Oral Daily Meegan, Eryn, RPH   10 mg at 05/22/23 0911   feeding supplement (NEPRO CARB STEADY) liquid 1,000 mL  1,000 mL Per Tube Continuous Morrison, Brenda, NP 45 mL/hr at 05/19/23 1836 Infusion Verify at 05/19/23 1836   fludrocortisone  (FLORINEF ) tablet 0.1 mg  0.1 mg Oral Daily Meegan, Eryn, RPH   0.1 mg at 05/22/23 0913   free water  30 mL  30 mL Per Tube Q4H Josette Ade, MD   30 mL at 05/22/23 1300   heparin  injection 1,000 Units  1,000 Units Intracatheter PRN Druscilla Bald, NP       heparin  injection 5,000 Units  5,000 Units Subcutaneous Q8H Mansy, Jan A, MD   5,000 Units at 05/22/23 1348   hydrOXYzine  (ATARAX ) tablet 50 mg  50 mg Oral TID Meegan, Eryn, RPH   50 mg at 05/22/23 0910   ipratropium-albuterol  (DUONEB) 0.5-2.5 (3) MG/3ML nebulizer solution 3 mL  3 mL Nebulization Q4H PRN Josette Ade, MD       liver oil-zinc  oxide (DESITIN) 40 % ointment   Topical TID PRN Mansy, Madison LABOR, MD       loperamide  HCl (IMODIUM ) 1 MG/7.5ML suspension 4 mg  4 mg Oral Q6H PRN Josette Ade, MD   4 mg at 05/22/23 1330   LORazepam  (ATIVAN ) tablet 0.5 mg  0.5 mg Oral  Q4H PRN Meegan, Eryn, RPH   0.5 mg at 05/16/23 2211   magnesium  hydroxide (MILK OF MAGNESIA) suspension 30 mL  30 mL Oral Daily PRN Mansy, Jan A, MD       metoCLOPramide  (REGLAN ) tablet 5 mg  5 mg Oral TID AC & HS Meegan, Eryn, RPH   5 mg at 05/22/23 1247   midodrine  (PROAMATINE ) tablet 15 mg  15 mg Oral TID with meals Josette Ade, MD   15 mg at 05/22/23 1247   multivitamin (RENA-VIT) tablet 1 tablet  1 tablet Oral QHS Madueme, Elvira C, RPH   1 tablet at 05/21/23 2015   nutrition supplement (JUVEN) (JUVEN) powder packet 1 packet  1 packet Oral BID BM Madueme, Elvira C, RPH   1 packet at 05/22/23 1247   nystatin  (MYCOSTATIN /NYSTOP ) topical powder   Topical TID Mansy, Jan A, MD   Given at 05/22/23 1230   ondansetron  (ZOFRAN ) tablet 4 mg  4 mg Oral Q6H PRN Meegan, Eryn, RPH       Or   ondansetron  (ZOFRAN ) injection 4 mg  4 mg Intravenous Q6H PRN Meegan, Eryn, RPH       Oral care mouth rinse  15 mL Mouth Rinse 4 times per day Jesus America, NP   15 mL at 05/22/23 9061   Oral care mouth rinse  15 mL Mouth Rinse PRN Jesus America, NP       traZODone  (DESYREL ) tablet 25 mg  25 mg Oral QHS PRN Meegan, Eryn, RPH   25 mg at 05/19/23 0114   Facility-Administered Medications Ordered in Other Encounters  Medication Dose Route  Frequency Provider Last Rate Last Admin   lidocaine -EPINEPHrine  (PF) (XYLOCAINE -EPINEPHrine ) 1 %-1:200000 (PF) injection    PRN Marea Selinda RAMAN, MD   20 mL at 03/23/23 1607     Abtx:  Anti-infectives (From admission, onward)    Start     Dose/Rate Route Frequency Ordered Stop   05/19/23 1200  vancomycin  (VANCOREADY) IVPB 750 mg/150 mL  Status:  Discontinued        750 mg 150 mL/hr over 60 Minutes Intravenous Every T-Th-Sa (Hemodialysis) 05/16/23 1205 05/19/23 0709   05/19/23 1200  vancomycin  (VANCOCIN ) 750 mg in sodium chloride  0.9 % 250 mL IVPB  Status:  Discontinued        750 mg 250 mL/hr over 60 Minutes Intravenous Every T-Th-Sa (Hemodialysis) 05/19/23 0710 05/19/23  0907   05/16/23 1830  piperacillin -tazobactam (ZOSYN ) IVPB 2.25 g  Status:  Discontinued        2.25 g 100 mL/hr over 30 Minutes Intravenous Every 8 hours 05/16/23 1725 05/21/23 1642   05/16/23 1200  vancomycin  (VANCOREADY) IVPB 1750 mg/350 mL        1,750 mg 175 mL/hr over 120 Minutes Intravenous  Once 05/16/23 0930 05/16/23 1417   05/15/23 1830  doxycycline  (VIBRAMYCIN ) 100 mg in sodium chloride  0.9 % 250 mL IVPB  Status:  Discontinued        100 mg 125 mL/hr over 120 Minutes Intravenous Every 12 hours 05/15/23 1722 05/16/23 0918   05/13/23 1000  cefTRIAXone  (ROCEPHIN ) 2 g in sodium chloride  0.9 % 100 mL IVPB  Status:  Discontinued        2 g 200 mL/hr over 30 Minutes Intravenous Every 24 hours 05/13/23 0336 05/16/23 1713   05/13/23 0400  azithromycin  (ZITHROMAX ) 500 mg in sodium chloride  0.9 % 250 mL IVPB  Status:  Discontinued        500 mg 250 mL/hr over 60 Minutes Intravenous Every 24 hours 05/13/23 0336 05/15/23 1722   05/13/23 0045  ceFEPIme  (MAXIPIME ) 2 g in sodium chloride  0.9 % 100 mL IVPB        2 g 200 mL/hr over 30 Minutes Intravenous  Once 05/13/23 0031 05/13/23 0249   05/13/23 0045  vancomycin  (VANCOCIN ) IVPB 1000 mg/200 mL premix        1,000 mg 200 mL/hr over 60 Minutes Intravenous  Once 05/13/23 0031 05/13/23 0357       REVIEW OF SYSTEMS:  Unable to comprehend She does nto say much Objective:  VITALS:  BP (!) 100/50 (BP Location: Right Arm)   Pulse 67   Temp 98.4 F (36.9 C)   Resp 18   Ht 5' 4 (1.626 m)   Wt 81.3 kg   SpO2 98%   BMI 30.77 kg/m  LDA Rt internal jugular HD cath PHYSICAL EXAM:  General: Awake, responds to questions most of the time  Lungs: b/l air entry Heart: s1s2. Abdomen: Soft, non-tender,not distended. Bowel sounds normal. No masses Extremities: left AKA Skin: peeling skin  Lymph: Cervical, supraclavicular normal. Neurologic: cannot assess Pertinent Labs Lab Results CBC      Component Value Date/Time   WBC 18.9 (H)  05/22/2023 0516   RBC 3.77 (L) 05/22/2023 0516   HGB 9.8 (L) 05/22/2023 0516   HGB 13.5 08/11/2013 0933   HCT 30.9 (L) 05/22/2023 0516   HCT 42.6 08/11/2013 0933   PLT 412 (H) 05/22/2023 0516   PLT 300 08/11/2013 0933   MCV 82.0 05/22/2023 0516   MCV 79 (L) 08/11/2013 0933   MCH 26.0  05/22/2023 0516   MCHC 31.7 05/22/2023 0516   RDW 16.3 (H) 05/22/2023 0516   RDW 14.1 08/11/2013 0933   LYMPHSABS 0.9 05/16/2023 0414   LYMPHSABS 3.2 08/11/2013 0933   MONOABS 1.2 (H) 05/16/2023 0414   MONOABS 0.6 08/11/2013 0933   EOSABS 9.4 (H) 05/16/2023 0414   EOSABS 1.2 (H) 08/11/2013 0933   BASOSABS 0.1 05/16/2023 0414   BASOSABS 0.1 08/11/2013 0933       Latest Ref Rng & Units 05/21/2023    6:36 AM 05/19/2023    6:53 AM 05/17/2023    6:23 AM  CMP  Glucose 70 - 99 mg/dL 95  877  95   BUN 8 - 23 mg/dL 78  894  43   Creatinine 0.44 - 1.00 mg/dL 5.54  4.12  6.02   Sodium 135 - 145 mmol/L 135  133  135   Potassium 3.5 - 5.1 mmol/L 3.5  3.7  3.2   Chloride 98 - 111 mmol/L 97  97  98   CO2 22 - 32 mmol/L 22  19  24    Calcium  8.9 - 10.3 mg/dL 9.4  9.3  8.7       Microbiology: Recent Results (from the past 240 hours)  Resp panel by RT-PCR (RSV, Flu A&B, Covid) Anterior Nasal Swab     Status: None   Collection Time: 05/12/23  4:37 PM   Specimen: Anterior Nasal Swab  Result Value Ref Range Status   SARS Coronavirus 2 by RT PCR NEGATIVE NEGATIVE Final    Comment: (NOTE) SARS-CoV-2 target nucleic acids are NOT DETECTED.  The SARS-CoV-2 RNA is generally detectable in upper respiratory specimens during the acute phase of infection. The lowest concentration of SARS-CoV-2 viral copies this assay can detect is 138 copies/mL. A negative result does not preclude SARS-Cov-2 infection and should not be used as the sole basis for treatment or other patient management decisions. A negative result may occur with  improper specimen collection/handling, submission of specimen other than nasopharyngeal  swab, presence of viral mutation(s) within the areas targeted by this assay, and inadequate number of viral copies(<138 copies/mL). A negative result must be combined with clinical observations, patient history, and epidemiological information. The expected result is Negative.  Fact Sheet for Patients:  bloggercourse.com  Fact Sheet for Healthcare Providers:  seriousbroker.it  This test is no t yet approved or cleared by the [place]  FDA and  has been authorized for detection and/or diagnosis of SARS-CoV-2 by FDA under an Emergency Use Authorization (EUA). This EUA will remain  in effect (meaning this test can be used) for the duration of the COVID-19 declaration under Section 564(b)(1) of the Act, 21 U.S.C.section 360bbb-3(b)(1), unless the authorization is terminated  or revoked sooner.       Influenza A by PCR NEGATIVE NEGATIVE Final   Influenza B by PCR NEGATIVE NEGATIVE Final    Comment: (NOTE) The Xpert Xpress SARS-CoV-2/FLU/RSV plus assay is intended as an aid in the diagnosis of influenza from Nasopharyngeal swab specimens and should not be used as a sole basis for treatment. Nasal washings and aspirates are unacceptable for Xpert Xpress SARS-CoV-2/FLU/RSV testing.  Fact Sheet for Patients: bloggercourse.com  Fact Sheet for Healthcare Providers: seriousbroker.it  This test is not yet approved or cleared by the [place]  FDA and has been authorized for detection and/or diagnosis of SARS-CoV-2 by FDA under an Emergency Use Authorization (EUA). This EUA will remain in effect (meaning this test can be used) for the duration of  the COVID-19 declaration under Section 564(b)(1) of the Act, 21 U.S.C. section 360bbb-3(b)(1), unless the authorization is terminated or revoked.     Resp Syncytial Virus by PCR NEGATIVE NEGATIVE Final    Comment: (NOTE) Fact Sheet for  Patients: bloggercourse.com  Fact Sheet for Healthcare Providers: seriousbroker.it  This test is not yet approved or cleared by the [place]  FDA and has been authorized for detection and/or diagnosis of SARS-CoV-2 by FDA under an Emergency Use Authorization (EUA). This EUA will remain in effect (meaning this test can be used) for the duration of the COVID-19 declaration under Section 564(b)(1) of the Act, 21 U.S.C. section 360bbb-3(b)(1), unless the authorization is terminated or revoked.  Performed at [facility], [address] [place], [place] [address]   Culture, blood (routine x 2)     Status: None   Collection Time: 05/12/23 11:45 PM   Specimen: BLOOD  Result Value Ref Range Status   Specimen Description BLOOD RIGHT ANTECUBITAL  Final   Special Requests   Final    BOTTLES DRAWN AEROBIC AND ANAEROBIC Blood Culture adequate volume   Culture   Final    NO GROWTH 5 DAYS Performed at [facility], [address] [place], [place] [address]    Report Status 05/18/2023 FINAL  Final  Culture, blood (routine x 2)     Status: None   Collection Time: 05/13/23 12:00 AM   Specimen: BLOOD  Result Value Ref Range Status   Specimen Description BLOOD LEFT WRIST  Final   Special Requests   Final    AEROBIC BOTTLE ONLY Blood Culture results may not be optimal due to an inadequate volume of blood received in culture bottles   Culture   Final    NO GROWTH 5 DAYS Performed at [facility], [address], [place], [place] [address]    Report Status 05/18/2023 FINAL  Final  Urine Culture (for pregnant, neutropenic or urologic patients or patients with an indwelling urinary catheter)     Status: Abnormal   Collection Time: 05/13/23 12:10 AM   Specimen: Urine, Clean Catch  Result Value Ref Range Status   Specimen Description   Final    URINE, CLEAN CATCH Performed at [facility], [address] [place], [place] [address]    Special Requests   Final    NONE Performed at [facility], [address] [place], [place] [address]    Culture >=100,000 COLONIES/mL KLEBSIELLA PNEUMONIAE (A)  Final   Report Status 05/15/2023 FINAL  Final   Organism ID, Bacteria KLEBSIELLA PNEUMONIAE (A)  Final      Susceptibility   Klebsiella pneumoniae - MIC*    AMPICILLIN  RESISTANT Resistant     CEFAZOLIN  <=4 SENSITIVE Sensitive     CEFEPIME  <=0.12 SENSITIVE Sensitive     CEFTRIAXONE  <=0.25 SENSITIVE Sensitive     CIPROFLOXACIN <=0.25 SENSITIVE Sensitive     GENTAMICIN  <=1 SENSITIVE Sensitive     IMIPENEM <=0.25 SENSITIVE Sensitive     NITROFURANTOIN <=16 SENSITIVE Sensitive     TRIMETH/SULFA <=20 SENSITIVE Sensitive     AMPICILLIN /SULBACTAM 4 SENSITIVE Sensitive     PIP/TAZO <=4 SENSITIVE Sensitive ug/mL    * >=100,000 COLONIES/mL KLEBSIELLA PNEUMONIAE  MRSA Next Gen by PCR, Nasal     Status: Abnormal   Collection Time: 05/14/23  4:30 PM   Specimen: Nasal Mucosa; Nasal Swab  Result Value Ref Range Status   MRSA by PCR Next Gen DETECTED (A) NOT DETECTED Final  Comment: RESULT CALLED TO, READ BACK BY AND VERIFIED WITH: DORATHY MUHORO @1908  ON 05/14/23 SKL (NOTE) The GeneXpert MRSA Assay (FDA approved for NASAL specimens only), is one component of a comprehensive MRSA colonization surveillance program. It is not intended to diagnose MRSA infection nor to guide or monitor treatment for MRSA infections. Test performance is not FDA approved in patients less than 2 years old. Performed at [facility], [address] [place], [place] [address]     IMAGING RESULTS:  I have personally reviewed the films ?Improved air space opacity rt lung compared to admission film  05/16/23 CTA- multifocal opacities- rt > left   Impression/Recommendation ? Patient presenting with altered mental status and initially had aspiration pneumonia, treated as community-acquired  pneumonia and had about 10 days of antibiotics different ones Had MRSA nares PCR positive   Patient has been treated for UTI due to Klebsiella, aspiration pneumonia initially with ceftriaxone  and azithromycin  and later with vancomycin  and Zosyn  MRSA nares PCR positive for likely MRSA pneumonia as well The total number of days of vancomycin  was 4 and Doxy was 2 to 6 days of MRSA treatment Leukocytosis is persisting It is improved than before  she also got Solu-Medrol  40 mg into 3 days from 2 1 2-3 and has been stopped After 10 days of antibiotics Vanco and Zosyn  were discontinued Daughter concerned that the leukocytosis is persisting Today patient has had blood culture sent She has a central line hemodialysis catheter.since Dec 2024 She has scaly skin- similar in sept 2024 and thought to be due to vanco/unasyn  She received vanco this admission Could this have contributed to leucocytosis? Remove excess peripheral lines on left arm If leucocytosis worsens tomorrow may have to start Anti MRSA Rx like linezolid   No diarrhea  ESRD Left AKA Anemia H/o necrotizing fascitis left leg  ? _I have personally spent  -75--minutes involved in face-to-face and non-face-to-face activities for this patient on the day of the visit. Professional time spent includes the following activities: Preparing to see the patient (review of tests), Obtaining and/or reviewing separately obtained history (admission/discharge record), Performing a medically appropriate examination and/or evaluation , Ordering medications/tests/procedures, referring and communicating with other health care professionals, Documenting clinical information in the EMR, Independently interpreting results (not separately reported), Communicating results to the patient/family/caregiver, Counseling and educating the patient/family/caregiver and Care coordination (not separately reported).    Note:  This document was prepared using Dragon voice  recognition software and may include unintentional dictation errors.

## 2023-05-22 NOTE — Consult Note (Signed)
 WOC Nurse Consult Note: patient familiar to WOC team from previous admission with a very similar presentation, used Xeroform at that time with good success per caregiver; felt to be all related to moisture at that time  Reason for Consult: peeling skin sacrum/back  Wound type: 1.  Full thickness R back ? Related to reaction as caregiver states this happens every time she comes to hospital  2.  Moisture Associated Skin Damage to buttocks 3.  ? Deep Tissue Pressure Injury B medial buttock evolving to Stage 3  4.  R ischium full thickness r/t reaction versus moisture and friction  Pressure Injury POA: yes, charted as stage 2 on admission  Measurement: 1. R lower back 6 cm x 6 cm x 0.1 cm 100% red moist;  2. Buttocks 7 cm x 10 cm x 0.1 cm 50% red moist 25% purple maroon most medial aspect 25% yellow  3.  R ischium 1 cm x 1 cm x 0.1 cm 100% pink moist   Wound bed: as above  Drainage (amount, consistency, odor) minimal serosanguinous  Periwound: dry peeling skin  Dressing procedure/placement/frequency: Clean R back, B buttocks and R ischium with NS, apply Xeroform gauze to wound beds daily, cover with ABD pad or silicone foam whichever is preferred.   POC discussed with caregiver and bedside nurse. Caregiver states Xeroform has worked well  in the past and would like MD to order this for home as home health said they had to have an order to use.  Patient would benefit from a low air loss mattress for moisture management and pressure redistribution.   WOC team will follow every 7 to 10 days to assess response to treatment.   Thank you,    Powell Bar MSN, RN-BC, [employer] [phone]

## 2023-05-23 DIAGNOSIS — R6520 Severe sepsis without septic shock: Secondary | ICD-10-CM | POA: Diagnosis not present

## 2023-05-23 DIAGNOSIS — D721 Eosinophilia, unspecified: Secondary | ICD-10-CM | POA: Diagnosis present

## 2023-05-23 DIAGNOSIS — A419 Sepsis, unspecified organism: Secondary | ICD-10-CM | POA: Diagnosis not present

## 2023-05-23 LAB — CBC WITH DIFFERENTIAL/PLATELET
Abs Immature Granulocytes: 1.77 10*3/uL — ABNORMAL HIGH (ref 0.00–0.07)
Basophils Absolute: 0.1 10*3/uL (ref 0.0–0.1)
Basophils Relative: 1 %
Eosinophils Absolute: 2.8 10*3/uL — ABNORMAL HIGH (ref 0.0–0.5)
Eosinophils Relative: 13 %
HCT: 29.7 % — ABNORMAL LOW (ref 36.0–46.0)
Hemoglobin: 9.6 g/dL — ABNORMAL LOW (ref 12.0–15.0)
Immature Granulocytes: 8 %
Lymphocytes Relative: 14 %
Lymphs Abs: 3 10*3/uL (ref 0.7–4.0)
MCH: 26.8 pg (ref 26.0–34.0)
MCHC: 32.3 g/dL (ref 30.0–36.0)
MCV: 83 fL (ref 80.0–100.0)
Monocytes Absolute: 1.2 10*3/uL — ABNORMAL HIGH (ref 0.1–1.0)
Monocytes Relative: 6 %
Neutro Abs: 12.4 10*3/uL — ABNORMAL HIGH (ref 1.7–7.7)
Neutrophils Relative %: 58 %
Platelets: 453 10*3/uL — ABNORMAL HIGH (ref 150–400)
RBC: 3.58 MIL/uL — ABNORMAL LOW (ref 3.87–5.11)
RDW: 17 % — ABNORMAL HIGH (ref 11.5–15.5)
Smear Review: NORMAL
WBC: 21.3 10*3/uL — ABNORMAL HIGH (ref 4.0–10.5)
nRBC: 0.4 % — ABNORMAL HIGH (ref 0.0–0.2)

## 2023-05-23 LAB — RENAL FUNCTION PANEL
Albumin: 3.3 g/dL — ABNORMAL LOW (ref 3.5–5.0)
Anion gap: 10 (ref 5–15)
BUN: 19 mg/dL (ref 8–23)
CO2: 26 mmol/L (ref 22–32)
Calcium: 8.6 mg/dL — ABNORMAL LOW (ref 8.9–10.3)
Chloride: 100 mmol/L (ref 98–111)
Creatinine, Ser: 0.9 mg/dL (ref 0.44–1.00)
GFR, Estimated: >60 mL/min (ref >60)
Glucose, Bld: 115 mg/dL — ABNORMAL HIGH (ref 70–99)
Phosphorus: 2.9 mg/dL (ref 2.5–4.6)
Potassium: 3.3 mmol/L — ABNORMAL LOW (ref 3.5–5.1)
Sodium: 136 mmol/L (ref 135–145)

## 2023-05-23 LAB — GLUCOSE, CAPILLARY: Glucose-Capillary: 88 mg/dL (ref 70–99)

## 2023-05-23 MED ORDER — ALTEPLASE 2 MG IJ SOLR: 2.0000 mg | Freq: Once | INTRAMUSCULAR | Status: DC | PRN

## 2023-05-23 MED ORDER — EPOETIN ALFA-EPBX 4000 UNIT/ML IJ SOLN
INTRAMUSCULAR | Status: AC
  Filled 2023-05-23: qty 1

## 2023-05-23 MED ORDER — LINEZOLID 600 MG/300ML IV SOLN
600.0000 mg | Freq: Two times a day (BID) | INTRAVENOUS | Status: DC
  Filled 2023-05-23: qty 300

## 2023-05-23 MED ORDER — LINEZOLID 600 MG PO TABS
600.0000 mg | ORAL_TABLET | Freq: Two times a day (BID) | ORAL | Status: DC
  Administered 2023-05-23 – 2023-05-24 (×3): 600 mg via ORAL
  Filled 2023-05-23 (×4): qty 1

## 2023-05-23 MED ORDER — POTASSIUM CHLORIDE CRYS ER 20 MEQ PO TBCR
20.0000 meq | EXTENDED_RELEASE_TABLET | Freq: Once | ORAL | Status: AC
  Administered 2023-05-23: 20 meq via ORAL
  Filled 2023-05-23: qty 1

## 2023-05-23 MED ORDER — LINEZOLID 600 MG PO TABS: 600.0000 mg | ORAL_TABLET | Freq: Two times a day (BID) | ORAL | Status: DC

## 2023-05-23 NOTE — Plan of Care (Signed)
  Problem: Clinical Measurements: Goal: Diagnostic test results will improve Outcome: Progressing   Problem: Clinical Measurements: Goal: Signs and symptoms of infection will decrease Outcome: Progressing   Problem: Respiratory: Goal: Ability to maintain adequate ventilation will improve Outcome: Progressing   

## 2023-05-23 NOTE — Progress Notes (Signed)
 Hemodialysis note  Received patient in bed to unit. Alert and oriented.  Informed consent signed and in chart.  Treatment initiated: 0915 Treatment completed: 1248  Patient tolerated well. Transported back to room, alert without acute distress.  Report given to patient's RN.   Access used: Right Chest HD PermCath Access issues: none  Total UF removed: 1.5L Medication(s) given:  Retacrit  4000 units IV  Post HD weight: 78 kg   [facility]

## 2023-05-23 NOTE — Progress Notes (Signed)
 [facility]  ROUNDING NOTE   Subjective:   Alexandra Foster is a 73 y.o. female with history of hypertension, coronary artery disease, congestive heart failure, COPD, cirrhosis, diabetes, status post left AKA, status post G-tube placement, and end stage renal disease on hemodialysis.  Patient presents to ED from dialysis at the request of family due to altered mental status.  Patient has been admitted for Lower urinary tract infectious disease [N39.0] Altered mental status, unspecified altered mental status type [R41.82] Pneumonia due to infectious organism, unspecified laterality, unspecified part of lung [J18.9] Sepsis due to gram-negative UTI (HCC) [A41.50, N39.0]   Patient is known to our practice from previous admissions and receives outpatient dialysis treatments at [facility] on a TTS schedule, supervised by [facility].    Patient seen on dialysis tolerating treatment.  Hemodialysis dialysis treatment flowsheet  Blood flow rate (mL/min): 349 Arterial pressures (mmHg): -157.77 Venous pressures (mmHg):141.41 TMP (mmHg): 3.43 Ultrafiltration rate (mL/min):685 Dialysate (mL/min): 299  Objective:  Vital signs in last 24 hours:  Temp:  [98.2 F (36.8 C)-99.2 F (37.3 C)] 98.2 F (36.8 C) (02/08 1552) Pulse Rate:  [65-79] 71 (02/08 1552) Resp:  [14-25] 23 (02/08 1253) BP: (90-118)/(46-63) 90/58 (02/08 1552) SpO2:  [89 %-100 %] 100 % (02/08 1552) Weight:  [78 kg-82.7 kg] 78 kg (02/08 1253)  Weight change: 1.1 kg Filed Weights   05/23/23 0905 05/23/23 1248 05/23/23 1253  Weight: 79.6 kg 78 kg 78 kg    Intake/Output: I/O last 3 completed shifts: In: 510 [NG/GT:510] Out: -    Intake/Output this shift:  Total I/O In: -  Out: 1500 [Other:1500]  Physical Exam: General: NAD,   Head: Normocephalic, atraumatic. Moist oral mucosal membranes  Eyes: Anicteric, PERRL  Neck: Supple, trachea midline  Lungs:  diminished to auscultation  Heart:  Regular rate and rhythm  Abdomen:  Soft, nontender, obese  Extremities:  Trace peripheral edema.  Neurologic: Nonfocal, moving all four extremities  Skin: No lesions, dry, flaky  Access: Rt chest permcath    Basic Metabolic Panel: Recent Labs  Lab 05/17/23 0623 05/19/23 0653 05/21/23 0636 05/23/23 0402  NA 135 133* 135 136  K 3.2* 3.7 3.5 3.3*  CL 98 97* 97* 100  CO2 24 19* 22 26  GLUCOSE 95 122* 95 115*  BUN 43* 105* 78* 19  CREATININE 3.97* 5.87* 4.45* 0.90  CALCIUM  8.7* 9.3 9.4 8.6*  PHOS  --   --  4.7* 2.9    Liver Function Tests: Recent Labs  Lab 05/19/23 0805 05/21/23 0636 05/23/23 0402  ALBUMIN  2.2* 2.1* 3.3*   No results for input(s): LIPASE, AMYLASE in the last 168 hours. No results for input(s): AMMONIA in the last 168 hours.  CBC: Recent Labs  Lab 05/19/23 0653 05/20/23 0546 05/21/23 0636 05/22/23 0516 05/23/23 0402  WBC 30.1* 18.8* 17.9* 18.9* 21.3*  NEUTROABS  --   --   --   --  12.4*  HGB 9.7* 10.2* 10.2* 9.8* 9.6*  HCT 29.9* 32.9* 32.4* 30.9* 29.7*  MCV 81.3 83.1 82.4 82.0 83.0  PLT 324 331 391 412* 453*    Cardiac Enzymes: No results for input(s): CKTOTAL, CKMB, CKMBINDEX, TROPONINI in the last 168 hours.  BNP: Invalid input(s): POCBNP  CBG: No results for input(s): GLUCAP in the last 168 hours.  Microbiology: Results for orders placed or performed during the hospital encounter of 05/13/23  Resp panel by RT-PCR (RSV, Flu A&B, Covid) Anterior Nasal Swab     Status: None  Collection Time: 05/12/23  4:37 PM   Specimen: Anterior Nasal Swab  Result Value Ref Range Status   SARS Coronavirus 2 by RT PCR NEGATIVE NEGATIVE Final    Comment: (NOTE) SARS-CoV-2 target nucleic acids are NOT DETECTED.  The SARS-CoV-2 RNA is generally detectable in upper respiratory specimens during the acute phase of infection. The lowest concentration of SARS-CoV-2 viral copies this assay can detect is 138 copies/mL. A negative result  does not preclude SARS-Cov-2 infection and should not be used as the sole basis for treatment or other patient management decisions. A negative result may occur with  improper specimen collection/handling, submission of specimen other than nasopharyngeal swab, presence of viral mutation(s) within the areas targeted by this assay, and inadequate number of viral copies(<138 copies/mL). A negative result must be combined with clinical observations, patient history, and epidemiological information. The expected result is Negative.  Fact Sheet for Patients:  bloggercourse.com  Fact Sheet for Healthcare Providers:  seriousbroker.it  This test is no t yet approved or cleared by the [place]  FDA and  has been authorized for detection and/or diagnosis of SARS-CoV-2 by FDA under an Emergency Use Authorization (EUA). This EUA will remain  in effect (meaning this test can be used) for the duration of the COVID-19 declaration under Section 564(b)(1) of the Act, 21 U.S.C.section 360bbb-3(b)(1), unless the authorization is terminated  or revoked sooner.       Influenza A by PCR NEGATIVE NEGATIVE Final   Influenza B by PCR NEGATIVE NEGATIVE Final    Comment: (NOTE) The Xpert Xpress SARS-CoV-2/FLU/RSV plus assay is intended as an aid in the diagnosis of influenza from Nasopharyngeal swab specimens and should not be used as a sole basis for treatment. Nasal washings and aspirates are unacceptable for Xpert Xpress SARS-CoV-2/FLU/RSV testing.  Fact Sheet for Patients: bloggercourse.com  Fact Sheet for Healthcare Providers: seriousbroker.it  This test is not yet approved or cleared by the [place]  FDA and has been authorized for detection and/or diagnosis of SARS-CoV-2 by FDA under an Emergency Use Authorization (EUA). This EUA will remain in effect (meaning this test can be used) for  the duration of the COVID-19 declaration under Section 564(b)(1) of the Act, 21 U.S.C. section 360bbb-3(b)(1), unless the authorization is terminated or revoked.     Resp Syncytial Virus by PCR NEGATIVE NEGATIVE Final    Comment: (NOTE) Fact Sheet for Patients: bloggercourse.com  Fact Sheet for Healthcare Providers: seriousbroker.it  This test is not yet approved or cleared by the [place]  FDA and has been authorized for detection and/or diagnosis of SARS-CoV-2 by FDA under an Emergency Use Authorization (EUA). This EUA will remain in effect (meaning this test can be used) for the duration of the COVID-19 declaration under Section 564(b)(1) of the Act, 21 U.S.C. section 360bbb-3(b)(1), unless the authorization is terminated or revoked.  Performed at [facility], [address] [place], [place] [address]   Culture, blood (routine x 2)     Status: None   Collection Time: 05/12/23 11:45 PM   Specimen: BLOOD  Result Value Ref Range Status   Specimen Description BLOOD RIGHT ANTECUBITAL  Final   Special Requests   Final    BOTTLES DRAWN AEROBIC AND ANAEROBIC Blood Culture adequate volume   Culture   Final    NO GROWTH 5 DAYS Performed at [facility], [address] [place], [place] [address]    Report Status 05/18/2023 FINAL  Final  Culture, blood (routine x 2)  Status: None   Collection Time: 05/13/23 12:00 AM   Specimen: BLOOD  Result Value Ref Range Status   Specimen Description BLOOD LEFT WRIST  Final   Special Requests   Final    AEROBIC BOTTLE ONLY Blood Culture results may not be optimal due to an inadequate volume of blood received in culture bottles   Culture   Final    NO GROWTH 5 DAYS Performed at [facility], [address] [place], [place] [address]    Report Status 05/18/2023 FINAL  Final  Urine Culture (for pregnant, neutropenic or urologic patients or patients with  an indwelling urinary catheter)     Status: Abnormal   Collection Time: 05/13/23 12:10 AM   Specimen: Urine, Clean Catch  Result Value Ref Range Status   Specimen Description   Final    URINE, CLEAN CATCH Performed at [facility], [address], [place], [place] [address]    Special Requests   Final    NONE Performed at [facility], [address], [place], [place] [address]    Culture >=100,000 COLONIES/mL KLEBSIELLA PNEUMONIAE (A)  Final   Report Status 05/15/2023 FINAL  Final   Organism ID, Bacteria KLEBSIELLA PNEUMONIAE (A)  Final      Susceptibility   Klebsiella pneumoniae - MIC*    AMPICILLIN  RESISTANT Resistant     CEFAZOLIN  <=4 SENSITIVE Sensitive     CEFEPIME  <=0.12 SENSITIVE Sensitive     CEFTRIAXONE  <=0.25 SENSITIVE Sensitive     CIPROFLOXACIN <=0.25 SENSITIVE Sensitive     GENTAMICIN  <=1 SENSITIVE Sensitive     IMIPENEM <=0.25 SENSITIVE Sensitive     NITROFURANTOIN <=16 SENSITIVE Sensitive     TRIMETH/SULFA <=20 SENSITIVE Sensitive     AMPICILLIN /SULBACTAM 4 SENSITIVE Sensitive     PIP/TAZO <=4 SENSITIVE Sensitive ug/mL    * >=100,000 COLONIES/mL KLEBSIELLA PNEUMONIAE  MRSA Next Gen by PCR, Nasal     Status: Abnormal   Collection Time: 05/14/23  4:30 PM   Specimen: Nasal Mucosa; Nasal Swab  Result Value Ref Range Status   MRSA by PCR Next Gen DETECTED (A) NOT DETECTED Final    Comment: RESULT CALLED TO, READ BACK BY AND VERIFIED WITH: DORATHY MUHORO @1908  ON 05/14/23 SKL (NOTE) The GeneXpert MRSA Assay (FDA approved for NASAL specimens only), is one component of a comprehensive MRSA colonization surveillance program. It is not intended to diagnose MRSA infection nor to guide or monitor treatment for MRSA infections. Test performance is not FDA approved in patients less than 68 years old. Performed at [facility], [address] [place], [place] [address]   Culture, blood (Routine X 2) w Reflex to ID Panel     Status: None  (Preliminary result)   Collection Time: 05/22/23  3:04 PM   Specimen: BLOOD RIGHT ARM  Result Value Ref Range Status   Specimen Description BLOOD RIGHT ARM  Final   Special Requests   Final    BOTTLES DRAWN AEROBIC ONLY Blood Culture results may not be optimal due to an inadequate volume of blood received in culture bottles   Culture   Final    NO GROWTH < 24 HOURS Performed at [facility], [address], [place], [place] [address]    Report Status PENDING  Incomplete  Culture, blood (Routine X 2) w Reflex to ID Panel     Status: None (Preliminary result)   Collection Time: 05/22/23  3:04 PM   Specimen: BLOOD RIGHT HAND  Result Value  Ref Range Status   Specimen Description BLOOD RIGHT HAND  Final   Special Requests   Final    BOTTLES DRAWN AEROBIC ONLY Blood Culture results may not be optimal due to an inadequate volume of blood received in culture bottles   Culture   Final    NO GROWTH < 24 HOURS Performed at [facility], [address] [place], [place] [address]    Report Status PENDING  Incomplete    Coagulation Studies: No results for input(s): LABPROT, INR in the last 72 hours.  Urinalysis: No results for input(s): COLORURINE, LABSPEC, PHURINE, GLUCOSEU, HGBUR, BILIRUBINUR, KETONESUR, PROTEINUR, UROBILINOGEN, NITRITE, LEUKOCYTESUR in the last 72 hours.  Invalid input(s): APPERANCEUR    Imaging: DG Chest Port 1 View Result Date: 05/22/2023 CLINICAL DATA:  Altered mental status and leukocytosis. EXAM: PORTABLE CHEST 1 VIEW COMPARISON:  05/16/2023 FINDINGS: Stable heart size and appearance of tunneled dialysis catheter. Airspace disease of the right lung appears improved with some mild patchy asymmetric opacity remaining compared to the left lung. This could represent asymmetric edema of versus pneumonia. No pleural fluid or pneumothorax identified. The visualized skeletal structures are unremarkable. IMPRESSION: Improved  airspace disease of the right lung with some mild patchy asymmetric opacity remaining compared to the left lung. This could represent asymmetric edema versus pneumonia. Electronically Signed   By: Marcey Moan M.D.   On: 05/22/2023 13:37     Medications:    feeding supplement (NEPRO CARB STEADY) 1,000 mL (05/22/23 1942)    ascorbic acid   250 mg Oral Daily   atorvastatin   20 mg Oral Daily   vitamin D3  2,000 Units Oral Daily   cholestyramine   4 g Oral TID   epoetin  alfa-epbx (RETACRIT ) injection  4,000 Units Intravenous Q T,Th,Sa-HD   escitalopram   10 mg Oral Daily   famotidine   10 mg Oral Daily   fludrocortisone   0.1 mg Oral Daily   free water   30 mL Per Tube Q4H   heparin  injection (subcutaneous)  5,000 Units Subcutaneous Q8H   hydrOXYzine   50 mg Oral TID   linezolid   600 mg Oral Q12H   metoCLOPramide   5 mg Oral TID AC & HS   midodrine   15 mg Oral TID with meals   multivitamin  1 tablet Oral QHS   nutrition supplement (JUVEN)  1 packet Oral BID BM   nystatin    Topical TID   mouth rinse  15 mL Mouth Rinse 4 times per day   potassium chloride   20 mEq Oral Once   acetaminophen  **OR** acetaminophen , chlorpheniramine-HYDROcodone, diphenhydrAMINE , ipratropium-albuterol , liver oil-zinc  oxide, loperamide  HCl, LORazepam , magnesium  hydroxide, ondansetron  **OR** ondansetron  (ZOFRAN ) IV, mouth rinse, traZODone   Assessment/ Plan:  [name] is a 73 y.o.  female with history of hypertension, coronary artery disease, congestive heart failure, COPD, cirrhosis, diabetes, status post left AKA, status post G-tube placement, and end stage renal disease on hemodialysis.  Patient presents to ED from dialysis at the request of family due to altered mental status.  Patient has been admitted for Lower urinary tract infectious disease [N39.0] Altered mental status, unspecified altered mental status type [R41.82] Pneumonia due to infectious organism, unspecified laterality, unspecified part  of lung [J18.9] Sepsis due to gram-negative UTI (HCC) [A41.50, N39.0]   CK [facility] Alpha/TTs/Rt permcath/75.6kg   End-stage renal disease: Patient received dialysis today, UF 1.5 L goal.     2.  Sepsis, gram negative UTI. Received Lactated ringers  in ED. Urine culture with Klebsiella.  Completed antibiotic regimen.  White count appears elevated but stable, 18.9.  Peaked at 30.1.   3: Anemia of chronic kidney disease Recent Labs       Lab Results  Component Value Date    HGB 9.6 (L) 05/23/2023      Continue low dose EPO with dialysis.    4: Secondary Hyperparathyroidism: with outpatient labs: PTH 35, phosphorus 7.6, calcium  9.4 on 04/21/23.    Recent Labs       Lab Results  Component Value Date    CALCIUM  8.6 05/23/2023    PHOS 2.9  05/23/2023      Calcium  and phosphorus within desired range.  Will monitor for changes   LOS: 10 Alexandra Foster 2/8/20254:38 PM

## 2023-05-23 NOTE — Progress Notes (Signed)
 Progress Note   Patient: Alexandra Foster FMW:[id number] DOB: 1950-07-15 DOA: 05/13/2023     10 DOS: the patient was seen and examined on 05/23/2023   Brief hospital course: 73 year old female with end-stage renal disease on hemodialysis, osteoarthritis, asthma, COPD, type 2 diabetes mellitus, GERD, hypertension and paroxysmal atrial fibrillation and obstructive sleep apnea on CPAP presented to the ER with confusion over the last 3 days with intermittent vomiting.  She was admitted with clinical sepsis secondary to right upper lobe pneumonia likely aspiration and urinary infection.  Started on antibiotic.  1/29.  Patient has some cough and shortness of breath.  Does not wear oxygen at home.  Blood pressure very high in the right arm and very low in the left arm.  CT angio of the left arm does not show any blockages.  May have a steal from the right arm with a catheter in the right chest.  Feeding tube confirmed then placed last night and can restart tube feeds.  Had a fever of 103. 1/30.  Patient asking for water .  Patient gets all of her nutrition through her tube. 1/31.  Patient complains of dry mouth. 2/1.  Called to rapid response at dialysis.  Patient had large brown BM then was less responsive and has shortness of breath.  Bipap ordered.  ABG.  Iv albumin .  Spoke with nephrology to remove fluid.  CT scan showing worsening pneumonia.  Antibiotics switched to Zosyn  and vancomycin  2/2.  Respiratory status much improved from yesterday.  Off BiPAP and on 3 L of oxygen.  Will try to taper oxygen.  Blood pressure on the lower side will increase midodrine  to 15 mg 3 times daily. 2/3.  This morning was down to 1 L asked nursing staff to try to taper off.  As needed Imodium  for diarrhea.  Steroids discontinued. 2/4.  White blood cell count 30.1.  Diarrhea not documented.  I assumed care 05/20/23 WBC improved to 18.8k Pt remained on IV Zosyn  through 2/6, stopped having completed total of 10 days  antibiotic coverage.    Assessment and Plan: * Severe sepsis (HCC) Present on admission with leukocytosis fever tachycardia and tachypnea and acute respiratory failure.  Patient has right upper lobe pneumonia and Klebsiella growing out of urine culture.  WBC climbed to peak 30.1 (note pt was also on steroids). Antibiotics switched on 2/1 to Zosyn .  Monitored off after 10 days completed (2/6 >> 2/7). Off steroids since 2/3. WBC increasing again, 21k today. ID consulted for recommendations Resumed on Linezolid  2/8 Repeat blood cultures pending ??Skin sourse.  Wounds healthy appearing based on Wound Care RN description from 2/7.   RN to remove PIV's Monitor fever curve, CBC and clinically for s/sx's of infection  Acute hypoxic respiratory failure (HCC) With respiratory distress on 2/1 was placed on BiPAP.  Last pulse ox on room air.  This problem has resolved.  Hypotension On midodrine  and florinef  for chronically low blood pressure.  Increased midodrine  to 15 mg 3 times daily.  Aspiration pneumonia (HCC) Multifocal pneumonia seen on CT scan.  Stop Zosyn . Completed total of 10 days antibiotics See Severe Sepsis   Acute metabolic encephalopathy Mental status improved.  ESRD on hemodialysis [facility]) Dialysis per Nephrology  Dyslipidemia On Lipitor  Acute on chronic diastolic CHF (congestive heart failure) (HCC) Likely more worsening pneumonia rather than heart failure.  Dialysis to manage fluid.  Hyperkalemia Improved with dialysis  Urinary tract infection due to Klebsiella species Treated with antibiotics  Pneumonia due to  infectious organism See aspiration pneumonia  Itching On atarax  standing dose and as needed Benadryl   Stage II decubitus ulcer (HCC) Buttocks, see full description below, documented on 1/30.  Diarrhea As needed Imodium  and standing dose cholestyramine .  Obesity (BMI 30-39.9) BMI down to 29.71  Anxiety and depression On Klonopin  and  Lexapro .        Subjective: Pt seen in dialysis this AM, sleeping but woke to voice. She reports being tired but denies other complaints or symptoms including cough, congestion, sore throat, abdominal pain, nauase/vomiting or diarrhea.   Physical Exam: Vitals:   05/23/23 1200 05/23/23 1230 05/23/23 1248 05/23/23 1253  BP: (!) 93/59 (!) 95/57 (!) 101/54 (!) 101/54  Pulse: 76 78 75   Resp: 19 14 19  (!) 23  Temp:   98.7 F (37.1 C)   TempSrc:   Axillary   SpO2: (!) 89% 100% 100% 98%  Weight:   78 kg 78 kg  Height:       General exam: awake, alert, no acute distress, chronically ill appearing HEENT: moist mucus membranes, hearing grossly normal  Respiratory system: lungs clear diminished bases due to poor inspiratory volumes, no wheezes, normal respiratory effort on 3 L/min [place] o2. Cardiovascular system: normal S1/S2, RRR, perm cath right upper chest accessed for dialysis Gastrointestinal system: soft, NT, ND Central nervous system: A&O x 2+. no gross focal neurologic deficits, normal speech Skin: dry flaky skin, normal temperature Psychiatry: normal mood, flat affect      Data Reviewed:   Notable labs --  K 3.3 Cl 97 Glucose 115 Ca 8.6   WBC improved 30.1 >> 18.8 >> 17.9 >>21.3 Hbg stable 10.2 >> 10.2 >> 9.6     Family Communication: Alexandra Foster updated by phone 2/7 and today.    Disposition: Status is: Inpatient Remains inpatient appropriate because: worsening leukocytosis resumed on antibiotics pending evaluation including repeat cultures.    Planned Discharge Destination: Home with Home Health    Time spent: 52 minutes including time at bedside and in coordination of care with staff and consultants, and family  Author: Burnard DELENA Cunning, DO 05/23/2023 3:47 PM  For on call review [email or web address].

## 2023-05-24 ENCOUNTER — Other Ambulatory Visit: Payer: Self-pay | Admitting: Family Medicine

## 2023-05-24 DIAGNOSIS — R4182 Altered mental status, unspecified: Secondary | ICD-10-CM | POA: Diagnosis not present

## 2023-05-24 DIAGNOSIS — R6520 Severe sepsis without septic shock: Secondary | ICD-10-CM | POA: Diagnosis not present

## 2023-05-24 DIAGNOSIS — A419 Sepsis, unspecified organism: Secondary | ICD-10-CM | POA: Diagnosis not present

## 2023-05-24 DIAGNOSIS — J189 Pneumonia, unspecified organism: Secondary | ICD-10-CM | POA: Diagnosis not present

## 2023-05-24 LAB — CBC WITH DIFFERENTIAL/PLATELET
Abs Immature Granulocytes: 0.9 10*3/uL — ABNORMAL HIGH (ref 0.00–0.07)
Basophils Absolute: 0.1 10*3/uL (ref 0.0–0.1)
Basophils Relative: 1 %
Eosinophils Absolute: 2.1 10*3/uL — ABNORMAL HIGH (ref 0.0–0.5)
Eosinophils Relative: 11 %
HCT: 31 % — ABNORMAL LOW (ref 36.0–46.0)
Hemoglobin: 9.9 g/dL — ABNORMAL LOW (ref 12.0–15.0)
Immature Granulocytes: 5 %
Lymphocytes Relative: 12 %
Lymphs Abs: 2.4 10*3/uL (ref 0.7–4.0)
MCH: 26.5 pg (ref 26.0–34.0)
MCHC: 31.9 g/dL (ref 30.0–36.0)
MCV: 82.9 fL (ref 80.0–100.0)
Monocytes Absolute: 1.2 10*3/uL — ABNORMAL HIGH (ref 0.1–1.0)
Monocytes Relative: 6 %
Neutro Abs: 12.8 10*3/uL — ABNORMAL HIGH (ref 1.7–7.7)
Neutrophils Relative %: 65 %
Platelets: 552 10*3/uL — ABNORMAL HIGH (ref 150–400)
RBC: 3.74 MIL/uL — ABNORMAL LOW (ref 3.87–5.11)
RDW: 17.5 % — ABNORMAL HIGH (ref 11.5–15.5)
WBC: 19.5 10*3/uL — ABNORMAL HIGH (ref 4.0–10.5)
nRBC: 0.2 % (ref 0.0–0.2)

## 2023-05-24 NOTE — Progress Notes (Signed)
 [facility]  ROUNDING NOTE   Subjective:   Alexandra Foster is a 73 y.o. female with history of hypertension, coronary artery disease, congestive heart failure, COPD, cirrhosis, diabetes, status post left AKA, status post G-tube placement, and end stage renal disease on hemodialysis.  Patient presents to ED from dialysis at the request of family due to altered mental status.  Patient has been admitted for Lower urinary tract infectious disease [N39.0] Altered mental status, unspecified altered mental status type [R41.82] Pneumonia due to infectious organism, unspecified laterality, unspecified part of lung [J18.9] Sepsis due to gram-negative UTI (HCC) [A41.50, N39.0]   Patient is known to our practice from previous admissions and receives outpatient dialysis treatments at [facility] on a TTS schedule, supervised by [facility].   Patient seen in room during rounds. Mild outbursts of discomfort during bedside care noted. No signs of respiratory distress.   Objective:  Vital signs in last 24 hours:  Temp:  [98.2 F (36.8 C)-100.1 F (37.8 C)] 100.1 F (37.8 C) (02/08 2303) Pulse Rate:  [71-78] 75 (02/09 0756) Resp:  [14-23] 18 (02/08 2303) BP: (90-111)/(51-59) 108/52 (02/09 0756) SpO2:  [89 %-100 %] 98 % (02/09 0756) Weight:  [78 kg-78.2 kg] 78.2 kg (02/09 0518)  Weight change: -3.1 kg Filed Weights   05/23/23 1248 05/23/23 1253 05/24/23 0518  Weight: 78 kg 78 kg 78.2 kg    Intake/Output: I/O last 3 completed shifts: In: -  Out: 1500 [Other:1500]   Intake/Output this shift:  No intake/output data recorded.  Physical Exam: General: NAD,   Head: Normocephalic, atraumatic. Moist oral mucosal membranes  Eyes: Anicteric, PERRL  Neck: Supple, trachea midline  Lungs:  Clear to auscultation  Heart: Regular rate and rhythm  Abdomen:  Soft, nontender,   Extremities:  No peripheral edema.  Neurologic: Nonfocal, moving all four extremities  Skin: No  lesions  Access: Rt chest permcath    Basic Metabolic Panel: Recent Labs  Lab 05/19/23 0653 05/21/23 0636 05/23/23 0402  NA 133* 135 136  K 3.7 3.5 3.3*  CL 97* 97* 100  CO2 19* 22 26  GLUCOSE 122* 95 115*  BUN 105* 78* 19  CREATININE 5.87* 4.45* 0.90  CALCIUM  9.3 9.4 8.6*  PHOS  --  4.7* 2.9    Liver Function Tests: Recent Labs  Lab 05/19/23 0805 05/21/23 0636 05/23/23 0402  ALBUMIN  2.2* 2.1* 3.3*   No results for input(s): LIPASE, AMYLASE in the last 168 hours. No results for input(s): AMMONIA in the last 168 hours.  CBC: Recent Labs  Lab 05/20/23 0546 05/21/23 0636 05/22/23 0516 05/23/23 0402 05/24/23 0412  WBC 18.8* 17.9* 18.9* 21.3* 19.5*  NEUTROABS  --   --   --  12.4* 12.8*  HGB 10.2* 10.2* 9.8* 9.6* 9.9*  HCT 32.9* 32.4* 30.9* 29.7* 31.0*  MCV 83.1 82.4 82.0 83.0 82.9  PLT 331 391 412* 453* 552*    Cardiac Enzymes: No results for input(s): CKTOTAL, CKMB, CKMBINDEX, TROPONINI in the last 168 hours.  BNP: Invalid input(s): POCBNP  CBG: Recent Labs  Lab 05/23/23 1719  GLUCAP 88    Microbiology: Results for orders placed or performed during the hospital encounter of 05/13/23  Resp panel by RT-PCR (RSV, Flu A&B, Covid) Anterior Nasal Swab     Status: None   Collection Time: 05/12/23  4:37 PM   Specimen: Anterior Nasal Swab  Result Value Ref Range Status   SARS Coronavirus 2 by RT PCR NEGATIVE NEGATIVE Final  Comment: (NOTE) SARS-CoV-2 target nucleic acids are NOT DETECTED.  The SARS-CoV-2 RNA is generally detectable in upper respiratory specimens during the acute phase of infection. The lowest concentration of SARS-CoV-2 viral copies this assay can detect is 138 copies/mL. A negative result does not preclude SARS-Cov-2 infection and should not be used as the sole basis for treatment or other patient management decisions. A negative result may occur with  improper specimen collection/handling, submission of specimen  other than nasopharyngeal swab, presence of viral mutation(s) within the areas targeted by this assay, and inadequate number of viral copies(<138 copies/mL). A negative result must be combined with clinical observations, patient history, and epidemiological information. The expected result is Negative.  Fact Sheet for Patients:  bloggercourse.com  Fact Sheet for Healthcare Providers:  seriousbroker.it  This test is no t yet approved or cleared by the [place]  FDA and  has been authorized for detection and/or diagnosis of SARS-CoV-2 by FDA under an Emergency Use Authorization (EUA). This EUA will remain  in effect (meaning this test can be used) for the duration of the COVID-19 declaration under Section 564(b)(1) of the Act, 21 U.S.C.section 360bbb-3(b)(1), unless the authorization is terminated  or revoked sooner.       Influenza A by PCR NEGATIVE NEGATIVE Final   Influenza B by PCR NEGATIVE NEGATIVE Final    Comment: (NOTE) The Xpert Xpress SARS-CoV-2/FLU/RSV plus assay is intended as an aid in the diagnosis of influenza from Nasopharyngeal swab specimens and should not be used as a sole basis for treatment. Nasal washings and aspirates are unacceptable for Xpert Xpress SARS-CoV-2/FLU/RSV testing.  Fact Sheet for Patients: bloggercourse.com  Fact Sheet for Healthcare Providers: seriousbroker.it  This test is not yet approved or cleared by the [place]  FDA and has been authorized for detection and/or diagnosis of SARS-CoV-2 by FDA under an Emergency Use Authorization (EUA). This EUA will remain in effect (meaning this test can be used) for the duration of the COVID-19 declaration under Section 564(b)(1) of the Act, 21 U.S.C. section 360bbb-3(b)(1), unless the authorization is terminated or revoked.     Resp Syncytial Virus by PCR NEGATIVE NEGATIVE Final     Comment: (NOTE) Fact Sheet for Patients: bloggercourse.com  Fact Sheet for Healthcare Providers: seriousbroker.it  This test is not yet approved or cleared by the [place]  FDA and has been authorized for detection and/or diagnosis of SARS-CoV-2 by FDA under an Emergency Use Authorization (EUA). This EUA will remain in effect (meaning this test can be used) for the duration of the COVID-19 declaration under Section 564(b)(1) of the Act, 21 U.S.C. section 360bbb-3(b)(1), unless the authorization is terminated or revoked.  Performed at [facility], [address] [place], [place] [address]   Culture, blood (routine x 2)     Status: None   Collection Time: 05/12/23 11:45 PM   Specimen: BLOOD  Result Value Ref Range Status   Specimen Description BLOOD RIGHT ANTECUBITAL  Final   Special Requests   Final    BOTTLES DRAWN AEROBIC AND ANAEROBIC Blood Culture adequate volume   Culture   Final    NO GROWTH 5 DAYS Performed at [facility], [address], [place], [place] [address]    Report Status 05/18/2023 FINAL  Final  Culture, blood (routine x 2)     Status: None   Collection Time: 05/13/23 12:00 AM   Specimen: BLOOD  Result Value Ref Range Status   Specimen Description BLOOD LEFT WRIST  Final   Special  Requests   Final    AEROBIC BOTTLE ONLY Blood Culture results may not be optimal due to an inadequate volume of blood received in culture bottles   Culture   Final    NO GROWTH 5 DAYS Performed at [facility], [address] [place], [place] [address]    Report Status 05/18/2023 FINAL  Final  Urine Culture (for pregnant, neutropenic or urologic patients or patients with an indwelling urinary catheter)     Status: Abnormal   Collection Time: 05/13/23 12:10 AM   Specimen: Urine, Clean Catch  Result Value Ref Range Status   Specimen Description   Final    URINE, CLEAN CATCH Performed at  [facility], [address], [place], [place] [address]    Special Requests   Final    NONE Performed at [facility], [address], [place], [place] [address]    Culture >=100,000 COLONIES/mL KLEBSIELLA PNEUMONIAE (A)  Final   Report Status 05/15/2023 FINAL  Final   Organism ID, Bacteria KLEBSIELLA PNEUMONIAE (A)  Final      Susceptibility   Klebsiella pneumoniae - MIC*    AMPICILLIN  RESISTANT Resistant     CEFAZOLIN  <=4 SENSITIVE Sensitive     CEFEPIME  <=0.12 SENSITIVE Sensitive     CEFTRIAXONE  <=0.25 SENSITIVE Sensitive     CIPROFLOXACIN <=0.25 SENSITIVE Sensitive     GENTAMICIN  <=1 SENSITIVE Sensitive     IMIPENEM <=0.25 SENSITIVE Sensitive     NITROFURANTOIN <=16 SENSITIVE Sensitive     TRIMETH/SULFA <=20 SENSITIVE Sensitive     AMPICILLIN /SULBACTAM 4 SENSITIVE Sensitive     PIP/TAZO <=4 SENSITIVE Sensitive ug/mL    * >=100,000 COLONIES/mL KLEBSIELLA PNEUMONIAE  MRSA Next Gen by PCR, Nasal     Status: Abnormal   Collection Time: 05/14/23  4:30 PM   Specimen: Nasal Mucosa; Nasal Swab  Result Value Ref Range Status   MRSA by PCR Next Gen DETECTED (A) NOT DETECTED Final    Comment: RESULT CALLED TO, READ BACK BY AND VERIFIED WITH: DORATHY MUHORO @1908  ON 05/14/23 SKL (NOTE) The GeneXpert MRSA Assay (FDA approved for NASAL specimens only), is one component of a comprehensive MRSA colonization surveillance program. It is not intended to diagnose MRSA infection nor to guide or monitor treatment for MRSA infections. Test performance is not FDA approved in patients less than 13 years old. Performed at [facility], [address] [place], [place] [address]   Culture, blood (Routine X 2) w Reflex to ID Panel     Status: None (Preliminary result)   Collection Time: 05/22/23  3:04 PM   Specimen: BLOOD RIGHT ARM  Result Value Ref Range Status   Specimen Description BLOOD RIGHT ARM  Final   Special Requests   Final    BOTTLES DRAWN AEROBIC ONLY  Blood Culture results may not be optimal due to an inadequate volume of blood received in culture bottles   Culture   Final    NO GROWTH 2 DAYS Performed at [facility], [address] [place], [place] [address]    Report Status PENDING  Incomplete  Culture, blood (Routine X 2) w Reflex to ID Panel     Status: None (Preliminary result)   Collection Time: 05/22/23  3:04 PM   Specimen: BLOOD RIGHT HAND  Result Value Ref Range Status   Specimen Description BLOOD RIGHT HAND  Final   Special Requests   Final    BOTTLES DRAWN AEROBIC ONLY Blood Culture results may not be  optimal due to an inadequate volume of blood received in culture bottles   Culture   Final    NO GROWTH 2 DAYS Performed at [facility], [address] [place], [place] [address]    Report Status PENDING  Incomplete    Coagulation Studies: No results for input(s): LABPROT, INR in the last 72 hours.  Urinalysis: No results for input(s): COLORURINE, LABSPEC, PHURINE, GLUCOSEU, HGBUR, BILIRUBINUR, KETONESUR, PROTEINUR, UROBILINOGEN, NITRITE, LEUKOCYTESUR in the last 72 hours.  Invalid input(s): APPERANCEUR    Imaging: No results found.   Medications:    feeding supplement (NEPRO CARB STEADY) 1,000 mL (05/24/23 0248)    ascorbic acid   250 mg Oral Daily   atorvastatin   20 mg Oral Daily   vitamin D3  2,000 Units Oral Daily   cholestyramine   4 g Oral TID   epoetin  alfa-epbx (RETACRIT ) injection  4,000 Units Intravenous Q T,Th,Sa-HD   escitalopram   10 mg Oral Daily   famotidine   10 mg Oral Daily   fludrocortisone   0.1 mg Oral Daily   free water   30 mL Per Tube Q4H   heparin  injection (subcutaneous)  5,000 Units Subcutaneous Q8H   hydrOXYzine   50 mg Oral TID   linezolid   600 mg Oral Q12H   metoCLOPramide   5 mg Oral TID AC & HS   midodrine   15 mg Oral TID with meals   multivitamin  1 tablet Oral QHS   nutrition supplement (JUVEN)  1 packet Oral BID BM   nystatin     Topical TID   mouth rinse  15 mL Mouth Rinse 4 times per day   acetaminophen  **OR** acetaminophen , chlorpheniramine-HYDROcodone, diphenhydrAMINE , ipratropium-albuterol , liver oil-zinc  oxide, loperamide  HCl, LORazepam , magnesium  hydroxide, ondansetron  **OR** ondansetron  (ZOFRAN ) IV, mouth rinse, traZODone   Assessment/ Plan:  [name] is a 73 y.o.  female [name] is a 73 y.o.  female with history of hypertension, coronary artery disease, congestive heart failure, COPD, cirrhosis, diabetes, status post left AKA, status post G-tube placement, and end stage renal disease on hemodialysis.  Patient presents to ED from dialysis at the request of family due to altered mental status.  Patient has been admitted for Lower urinary tract infectious disease [N39.0] Altered mental status, unspecified altered mental status type [R41.82] Pneumonia due to infectious organism, unspecified laterality, unspecified part of lung [J18.9] Sepsis due to gram-negative UTI (HCC) [A41.50, N39.0]   [facility]/TTs/Rt permcath/75.6kg   End-stage renal disease: Patient last HD 2/8 UF 1.5 L tolerated. Plan for next dialysis on Tuesday.   2.  Sepsis, gram negative UTI. Received Lactated ringers  in ED. Urine culture with Klebsiella.  Completed antibiotic regimen. White count appears elevated but stable, 18.9.  Peaked at 30.1.   3: Anemia of chronic kidney disease Recent Labs           Lab Results  Component Value Date    HGB 9.9 (L) 05/24/2023      Continue low dose EPO with dialysis.    4: Secondary Hyperparathyroidism: with outpatient labs: PTH 35, phosphorus 7.6, calcium  9.4 on 04/21/23.    Recent Labs           Lab Results  Component Value Date    CALCIUM  8.6 05/23/2023    PHOS 2.9  05/23/2023      Calcium  and phosphorus within desired range.  Will monitor for changes   LOS: 11 Pranshu Lyster P Polo Mcmartin 2/9/202511:16 AM

## 2023-05-24 NOTE — Progress Notes (Addendum)
 Date of Admission:  05/13/2023    ID: Alexandra Foster is a 73 y.o. female Principal Problem:   Severe sepsis (HCC) Active Problems:   Acute hypoxic respiratory failure (HCC)   Acute metabolic encephalopathy   Anxiety and depression   ESRD on hemodialysis (HCC)   Obesity (BMI 30-39.9)   Diarrhea   Aspiration pneumonia (HCC)   Stage II decubitus ulcer (HCC)   Dyslipidemia   Hypotension   Itching   Pneumonia due to infectious organism   Urinary tract infection due to Klebsiella species   Hyperkalemia   Acute on chronic diastolic CHF (congestive heart failure) (HCC)   Eosinophilia  Alexandra Foster is a 73 y.o. 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 As per the ED chart and the family the patient has had 3-day history of increasing confusion.  She also had intermittent vomiting.  She has past history of aspiration pneumonia as well as UTI. In the ED vitals were BP of 138/116, pulse 93, respiratory rate 18, temperature 97.6   Labs revealed WBC of 20.8, Hb 14.3, platelet 353 , lactic acid of 2.3, CO2 of 21, glucose of 142, BUN 26, creatinine 3.61, alk phos of 198, albumin  of 3.2, protein of 8.9 EKG showed normal sinus rhythm Chest x-ray showed hazy opacities over the right upper lobe patient was started on IV cefepime  and Vanco after blood cultures were sent. Patient was switched to Rocephin  and Zithromax  for community-acquired pneumonia.  She also had Klebsiella in the urine.  There was a concern for aspiration pneumonia. As leukocytosis was still elevated with MRSA PCR positive the  Zithromax  was switched to doxycycline  and ceftriaxone  was continued on 05/15/2023. [name]  Patient had a vasovagal episode after the bowel movement on 05/16/2023.The Doxy was switched to vancomycin  on  05/16/2023.  CT scan showed worsening pneumonia and hence ceftriaxone  was switched to Zosyn  on 05/16/2023..  On 05/19/2023 the blood white blood count was 30 and the steroids which she was on before was to have been discontinued on 05/18/2023. On 05/20/2023 the WBC was 18.8 the patient remained on Zosyn .  Antibiotics was discontinued on 05/21/2023. I am asked to see the patient asWhite count still remains high and the daughter was upset that the antibiotics were stopped.  Subjective: Pt is feeling better Daughter at bed side Medications:   ascorbic acid   250 mg Oral Daily   atorvastatin   20 mg Oral Daily   vitamin D3  2,000 Units Oral Daily   cholestyramine   4 g Oral TID   epoetin  alfa-epbx (RETACRIT ) injection  4,000 Units Intravenous Q T,Th,Sa-HD   escitalopram   10 mg Oral Daily   famotidine   10 mg Oral Daily   fludrocortisone   0.1 mg Oral Daily   free water   30 mL Per Tube Q4H   heparin  injection (subcutaneous)  5,000 Units Subcutaneous Q8H   hydrOXYzine   50 mg Oral TID   linezolid   600 mg Oral Q12H   metoCLOPramide   5 mg Oral TID AC & HS   midodrine   15 mg Oral TID with meals   multivitamin  1 tablet Oral QHS   nutrition supplement (JUVEN)  1 packet  Oral BID BM   nystatin    Topical TID   mouth rinse  15 mL Mouth Rinse 4 times per day    Objective: Vital signs in last 24 hours: Patient Vitals for the past 24 hrs:  BP Temp Temp src Pulse Resp SpO2 Weight  05/24/23 1241 (!) 98/40 -- -- 71 18 99 % --  05/24/23 0756 (!) 108/52 -- -- 75 -- 98 % --  05/24/23 0518 -- -- -- -- -- -- 78.2 kg  05/23/23 2303 (!) 111/57 100.1 F (37.8 C) -- 73 18 95 % --  05/23/23 2014 (!) 105/51 99.7 F (37.6 C) -- 71 18 98 % --  05/23/23 1552 (!) 90/58 98.2 F (36.8 C) Axillary 71 -- 100 % --     LDA HD cath    PHYSICAL EXAM:  General: Alert, cooperative, no distress, appears stated age.  Lungs: Clear to auscultation bilaterally. No Wheezing or Rhonchi. No rales. Heart: Regular rate and rhythm, no murmur, rub or gallop. Abdomen: Soft,PEG Left AKA Skin:dry scaling skln over trunk, arms , lowe rextremities Lymph: Cervical, supraclavicular normal. Neurologic: Grossly non-focal  Lab Results    Latest Ref Rng & Units 05/24/2023    4:12 AM 05/23/2023    4:02 AM 05/22/2023    5:16 AM  CBC  WBC 4.0 - 10.5 K/uL 19.5  21.3  18.9   Hemoglobin 12.0 - 15.0 g/dL 9.9  9.6  9.8   Hematocrit 36.0 - 46.0 % 31.0  29.7  30.9   Platelets 150 - 400 K/uL 552  453  412        Latest Ref Rng & Units 05/23/2023    4:02 AM 05/21/2023    6:36 AM 05/19/2023    6:53 AM  CMP  Glucose 70 - 99 mg/dL 884  95  877   BUN 8 - 23 mg/dL 19  78  894   Creatinine 0.44 - 1.00 mg/dL 9.09  5.54  4.12   Sodium 135 - 145 mmol/L 136  135  133   Potassium 3.5 - 5.1 mmol/L 3.3  3.5  3.7   Chloride 98 - 111 mmol/L 100  97  97   CO2 22 - 32 mmol/L 26  22  19    Calcium  8.9 - 10.3 mg/dL 8.6  9.4  9.3       Microbiology: BC - NG    Assessment/Plan: Patient presenting with altered mental status and initially had aspiration pneumonia, treated as community-acquired pneumonia and had about 10 days of antibiotics different ones Had MRSA nares PCR positive    Patient has been treated for UTI due to Klebsiella, and aspiration pneumonia. initially with ceftriaxone  and azithromycin  and later with vancomycin  and Zosyn  MRSA nares PCR positive==likely MRSA pneumonia The total number of days of vancomycin  was 4 and Doxy was 2 to 6 days of MRSA treatment Leukocytosis is persisting  she also got Solu-Medrol  40 mg into 3 days from 2 1 2-3 and has been stopped After 10 days of antibiotics Vanco and Zosyn  were discontinued on 2/6  blood culture sent on 2/7 She has a central line hemodialysis catheter.since Dec 2024  She has scaly peeling skin-  similar in sept 2024 and thought to be due to vanco or unasyn   She received vanco this admission- likely she has vanco allergy  ( also got zosyn )- she has eosinophilia which is improving  Could this have contributed to leucocytosis? Remove excess peripheral lines on left arm Started linezolid  on 2/8- watch until  tomorrow- if bc neg and no improvement in leucocytosis canl DC antibiotc   No diarrhea   ESRD Left AKA Anemia H/o necrotizing fascitis left leg  Discussed the management with her daughter at bed side

## 2023-05-24 NOTE — Progress Notes (Signed)
 Progress Note   Patient: Alexandra Foster FMW:[id number] DOB: 1950/10/29 DOA: 05/13/2023     11 DOS: the patient was seen and examined on 05/24/2023   Brief hospital course: 73 year old female with end-stage renal disease on hemodialysis, osteoarthritis, asthma, COPD, type 2 diabetes mellitus, GERD, hypertension and paroxysmal atrial fibrillation and obstructive sleep apnea on CPAP presented to the ER with confusion over the last 3 days with intermittent vomiting.  She was admitted with clinical sepsis secondary to right upper lobe pneumonia likely aspiration and urinary infection.  Started on antibiotic.  1/29.  Patient has some cough and shortness of breath.  Does not wear oxygen at home.  Blood pressure very high in the right arm and very low in the left arm.  CT angio of the left arm does not show any blockages.  May have a steal from the right arm with a catheter in the right chest.  Feeding tube confirmed then placed last night and can restart tube feeds.  Had a fever of 103. 1/30.  Patient asking for water .  Patient gets all of her nutrition through her tube. 1/31.  Patient complains of dry mouth. 2/1.  Called to rapid response at dialysis.  Patient had large brown BM then was less responsive and has shortness of breath.  Bipap ordered.  ABG.  Iv albumin .  Spoke with nephrology to remove fluid.  CT scan showing worsening pneumonia.  Antibiotics switched to Zosyn  and vancomycin  2/2.  Respiratory status much improved from yesterday.  Off BiPAP and on 3 L of oxygen.  Will try to taper oxygen.  Blood pressure on the lower side will increase midodrine  to 15 mg 3 times daily. 2/3.  This morning was down to 1 L asked nursing staff to try to taper off.  As needed Imodium  for diarrhea.  Steroids discontinued. 2/4.  White blood cell count 30.1.  Diarrhea not documented.  I assumed care 05/20/23 WBC improved to 18.8k Pt remained on IV Zosyn  through 2/6, stopped having completed total of 10 days  antibiotic coverage.    Assessment and Plan: * Severe sepsis (HCC) Present on admission with leukocytosis fever tachycardia and tachypnea and acute respiratory failure.  Patient has right upper lobe pneumonia and Klebsiella growing out of urine culture.  WBC climbed to peak 30.1 (note pt was also on steroids). Antibiotics switched on 2/1 to Zosyn .  Monitored off after 10 days completed (2/6 >> 2/7). Off steroids since 2/3. WBC increasing again, 21k today. ID consulted for recommendations Resumed on Linezolid  2/8 Repeat blood cultures pending ??Skin sourse.  Wounds healthy appearing based on Wound Care RN description from 2/7.   RN to remove PIV's Monitor fever curve, CBC and clinically for s/sx's of infection  Acute hypoxic respiratory failure (HCC) With respiratory distress on 2/1 was placed on BiPAP.  Last pulse ox on room air.  This problem has resolved.  Hypotension On midodrine  and florinef  for chronically low blood pressure.  Increased midodrine  to 15 mg 3 times daily.  Aspiration pneumonia (HCC) Multifocal pneumonia seen on CT scan.  Stop Zosyn . Completed total of 10 days antibiotics See Severe Sepsis   Acute metabolic encephalopathy Mental status improved.  ESRD on hemodialysis [facility]) Dialysis per Nephrology  Dyslipidemia On Lipitor  Acute on chronic diastolic CHF (congestive heart failure) (HCC) Likely more worsening pneumonia rather than heart failure.  Dialysis to manage fluid.  Hyperkalemia Improved with dialysis  Urinary tract infection due to Klebsiella species Treated with antibiotics  Pneumonia due to  infectious organism See aspiration pneumonia  Itching On atarax  standing dose and as needed Benadryl   Stage II decubitus ulcer (HCC) Buttocks, see full description below, documented on 1/30.  Diarrhea As needed Imodium  and standing dose cholestyramine .  Obesity (BMI 30-39.9) BMI down to 29.71  Anxiety and depression On Klonopin  and  Lexapro .        Subjective: Pt awake resting in bed when seen this AM.  Reports feeling hot and the room thermostat set about 78 degrees.  Pt otherwise denies feeling sick or any symptoms.   Physical Exam: Vitals:   05/23/23 2014 05/23/23 2303 05/24/23 0518 05/24/23 0756  BP: (!) 105/51 (!) 111/57  (!) 108/52  Pulse: 71 73  75  Resp: 18 18    Temp: 99.7 F (37.6 C) 100.1 F (37.8 C)    TempSrc:      SpO2: 98% 95%  98%  Weight:   78.2 kg   Height:       General exam: awake, alert, no acute distress, chronically ill appearing HEENT: moist mucus membranes, hearing grossly normal  Respiratory system: lungs clear diminished bases due to poor inspiratory volumes, no wheezes, normal respiratory effort on 3 L/min [name] Cardiovascular system: normal S1/S2, RRR, perm cath right upper chest accessed for dialysis Gastrointestinal system: soft, NT, ND Central nervous system: A&O x 2+. no gross focal neurologic deficits, normal speech Skin: dry flaky skin, normal temperature Psychiatry: normal mood, flat affect      Data Reviewed:   Notable labs  --  Last K 3.3  WBC trending 30.1 >> 18.8 >> 17.9 >>21.3 (resumed abx) >> 19.5  Hbg stable 10.2 >> 10.2 >> 9.6 >> 9.9     Family Communication: Daughter Alexandra Foster updated by phone 2/7 and 2/8. Will attempt to call as time allows.    Disposition: Status is: Inpatient Remains inpatient appropriate because: worsening/persistent leukocytosis resumed on antibiotics 2/8, pending evaluation including repeat cultures.    Planned Discharge Destination: Home with Home Health    Time spent: 42 minutes  Author: Burnard DELENA Cunning, DO 05/24/2023 12:30 PM  For on call review [email or web address].

## 2023-05-24 NOTE — Plan of Care (Signed)

## 2023-05-25 DIAGNOSIS — R6520 Severe sepsis without septic shock: Secondary | ICD-10-CM | POA: Diagnosis not present

## 2023-05-25 DIAGNOSIS — A419 Sepsis, unspecified organism: Secondary | ICD-10-CM | POA: Diagnosis not present

## 2023-05-25 LAB — CBC
HCT: 32.6 % — ABNORMAL LOW (ref 36.0–46.0)
Hemoglobin: 10.4 g/dL — ABNORMAL LOW (ref 12.0–15.0)
MCH: 26.7 pg (ref 26.0–34.0)
MCHC: 31.9 g/dL (ref 30.0–36.0)
MCV: 83.8 fL (ref 80.0–100.0)
Platelets: 623 10*3/uL — ABNORMAL HIGH (ref 150–400)
RBC: 3.89 MIL/uL (ref 3.87–5.11)
RDW: 18.1 % — ABNORMAL HIGH (ref 11.5–15.5)
WBC: 22.3 10*3/uL — ABNORMAL HIGH (ref 4.0–10.5)
nRBC: 0.1 % (ref 0.0–0.2)

## 2023-05-25 LAB — BASIC METABOLIC PANEL
Anion gap: 11 (ref 5–15)
BUN: 57 mg/dL — ABNORMAL HIGH (ref 8–23)
CO2: 23 mmol/L (ref 22–32)
Calcium: 9.6 mg/dL (ref 8.9–10.3)
Chloride: 99 mmol/L (ref 98–111)
Creatinine, Ser: 3.77 mg/dL — ABNORMAL HIGH (ref 0.44–1.00)
GFR, Estimated: 12 mL/min — ABNORMAL LOW (ref >60)
Glucose, Bld: 112 mg/dL — ABNORMAL HIGH (ref 70–99)
Potassium: 4 mmol/L (ref 3.5–5.1)
Sodium: 133 mmol/L — ABNORMAL LOW (ref 135–145)

## 2023-05-25 LAB — GLUCOSE, CAPILLARY: Glucose-Capillary: 111 mg/dL — ABNORMAL HIGH (ref 70–99)

## 2023-05-25 MED ORDER — ALTEPLASE 2 MG IJ SOLR
INTRAMUSCULAR | Status: AC
  Filled 2023-05-25: qty 2

## 2023-05-25 MED ORDER — HYDROCOD POLI-CHLORPHE POLI ER 10-8 MG/5ML PO SUER: 5.0000 mL | Freq: Two times a day (BID) | ORAL | Status: DC | PRN

## 2023-05-25 MED ORDER — CHOLESTYRAMINE 4 G PO PACK
4.0000 g | PACK | Freq: Three times a day (TID) | ORAL | Status: DC
  Administered 2023-05-25 – 2023-05-27 (×7): 4 g
  Filled 2023-05-25 (×9): qty 1

## 2023-05-25 MED ORDER — FAMOTIDINE 20 MG PO TABS
10.0000 mg | ORAL_TABLET | Freq: Every day | ORAL | Status: DC
  Administered 2023-05-25 – 2023-05-27 (×3): 10 mg
  Filled 2023-05-25 (×3): qty 1

## 2023-05-25 MED ORDER — LORAZEPAM 0.5 MG PO TABS
0.5000 mg | ORAL_TABLET | ORAL | Status: DC | PRN
  Administered 2023-05-26 – 2023-05-27 (×2): 0.5 mg
  Filled 2023-05-25 (×2): qty 1

## 2023-05-25 MED ORDER — HYDROXYZINE HCL 25 MG PO TABS
50.0000 mg | ORAL_TABLET | Freq: Three times a day (TID) | ORAL | Status: DC
  Administered 2023-05-25 – 2023-05-27 (×7): 50 mg
  Filled 2023-05-25 (×7): qty 2

## 2023-05-25 MED ORDER — LINEZOLID 600 MG PO TABS
600.0000 mg | ORAL_TABLET | Freq: Two times a day (BID) | ORAL | Status: DC
  Administered 2023-05-25 (×2): 600 mg
  Filled 2023-05-25 (×2): qty 1

## 2023-05-25 MED ORDER — FLUDROCORTISONE ACETATE 0.1 MG PO TABS
0.1000 mg | ORAL_TABLET | Freq: Every day | ORAL | Status: DC
  Administered 2023-05-25 – 2023-05-27 (×3): 0.1 mg
  Filled 2023-05-25 (×3): qty 1

## 2023-05-25 MED ORDER — LOPERAMIDE HCL 1 MG/7.5ML PO SUSP
4.0000 mg | Freq: Four times a day (QID) | ORAL | Status: DC | PRN
  Administered 2023-05-25: 4 mg
  Filled 2023-05-25: qty 30

## 2023-05-25 MED ORDER — VITAMIN D 25 MCG (1000 UNIT) PO TABS
2000.0000 [IU] | ORAL_TABLET | Freq: Every day | ORAL | Status: DC
  Administered 2023-05-25 – 2023-05-27 (×3): 2000 [IU]
  Filled 2023-05-25 (×3): qty 2

## 2023-05-25 MED ORDER — ONDANSETRON HCL 4 MG/2ML IJ SOLN
4.0000 mg | Freq: Four times a day (QID) | INTRAMUSCULAR | Status: DC | PRN
  Administered 2023-05-26: 4 mg via INTRAVENOUS

## 2023-05-25 MED ORDER — DIPHENHYDRAMINE HCL 12.5 MG/5ML PO ELIX: 12.5000 mg | ORAL_SOLUTION | Freq: Three times a day (TID) | ORAL | Status: DC | PRN

## 2023-05-25 MED ORDER — VITAMIN C 500 MG PO TABS
250.0000 mg | ORAL_TABLET | Freq: Every day | ORAL | Status: DC
  Administered 2023-05-25 – 2023-05-27 (×3): 250 mg
  Filled 2023-05-25 (×2): qty 1

## 2023-05-25 MED ORDER — ATORVASTATIN CALCIUM 20 MG PO TABS
20.0000 mg | ORAL_TABLET | Freq: Every day | ORAL | Status: DC
Start: 2023-05-25 — End: 2023-05-27
  Administered 2023-05-25 – 2023-05-27 (×3): 20 mg
  Filled 2023-05-25 (×3): qty 1

## 2023-05-25 MED ORDER — ALTEPLASE 2 MG IJ SOLR
2.0000 mg | Freq: Once | INTRAMUSCULAR | Status: AC | PRN
  Administered 2023-05-25: 2 mg

## 2023-05-25 MED ORDER — ONDANSETRON HCL 4 MG PO TABS: 4.0000 mg | ORAL_TABLET | Freq: Four times a day (QID) | ORAL | Status: DC | PRN

## 2023-05-25 MED ORDER — MAGNESIUM HYDROXIDE 400 MG/5ML PO SUSP: 30.0000 mL | Freq: Every day | ORAL | Status: DC | PRN

## 2023-05-25 MED ORDER — TRAZODONE HCL 50 MG PO TABS: 25.0000 mg | ORAL_TABLET | Freq: Every evening | ORAL | Status: DC | PRN

## 2023-05-25 MED ORDER — MIDODRINE HCL 5 MG PO TABS
15.0000 mg | ORAL_TABLET | Freq: Three times a day (TID) | ORAL | Status: DC
  Administered 2023-05-25 – 2023-05-27 (×7): 15 mg
  Filled 2023-05-25 (×8): qty 3

## 2023-05-25 MED ORDER — STERILE WATER FOR INJECTION IJ SOLN
INTRAMUSCULAR | Status: AC
  Filled 2023-05-25: qty 10

## 2023-05-25 MED ORDER — JUVEN PO PACK
1.0000 | PACK | Freq: Two times a day (BID) | ORAL | Status: DC
  Administered 2023-05-25 – 2023-05-27 (×4): 1

## 2023-05-25 MED ORDER — ESCITALOPRAM OXALATE 10 MG PO TABS
10.0000 mg | ORAL_TABLET | Freq: Every day | ORAL | Status: DC
  Administered 2023-05-25 – 2023-05-27 (×3): 10 mg
  Filled 2023-05-25 (×3): qty 1

## 2023-05-25 NOTE — Plan of Care (Signed)

## 2023-05-25 NOTE — Telephone Encounter (Signed)
 Requested medication (s) are due for refill today: Yes  Requested medication (s) are on the active medication list: Yes  Last refill:  04/24/23  Future visit scheduled: Yes  Notes to clinic:  Unable to refill per protocol, cannot delegate.      Requested Prescriptions  Pending Prescriptions Disp Refills   midodrine  (PROAMATINE ) 10 MG tablet [Pharmacy Med Name: Midodrine  HCl 10 MG Oral Tablet] 180 tablet 0    Sig: PLACE 2 TABLETS INTO FEEDING TUBE THREE TIMES DAILY WITH MEALS     Not Delegated - Cardiovascular: Midodrine  Failed - 05/25/2023  5:08 PM      Failed - This refill cannot be delegated      Failed - Cr in normal range and within 360 days    Creat  Date Value Ref Range Status  02/11/2021 0.98 0.60 - 1.00 mg/dL Final   Creatinine, Ser  Date Value Ref Range Status  05/25/2023 3.77 (H) 0.44 - 1.00 mg/dL Final   Creatinine, Urine  Date Value Ref Range Status  02/11/2021 151 20 - 275 mg/dL Final         Failed - ALT in normal range and within 360 days    ALT  Date Value Ref Range Status  05/12/2023 64 (H) 0 - 44 U/L Final   SGPT (ALT)  Date Value Ref Range Status  08/11/2013 26 12 - 78 U/L Final         Failed - AST in normal range and within 360 days    AST  Date Value Ref Range Status  05/12/2023 81 (H) 15 - 41 U/L Final   SGOT(AST)  Date Value Ref Range Status  08/11/2013 27 15 - 37 Unit/L Final         Passed - Last BP in normal range    BP Readings from Last 1 Encounters:  05/25/23 129/68         Passed - Valid encounter within last 12 months    Recent Outpatient Visits           2 months ago Acute respiratory distress   [place] [facility] Mecum, Erin E, PA-C   2 months ago SOB (shortness of breath)   [place] Mecum, Erin E, PA-C   3 months ago End-stage renal disease on hemodialysis [facility])   [place] [facility] Sowles, Krichna, MD   2 years ago Viral upper respiratory tract  infection   [place] [facility] Sowles, Krichna, MD   2 years ago Diabetes mellitus type 2 in obese [facility])   [facility] Sowles, Krichna, MD       Future Appointments             In 4 weeks Sowles, Krichna, MD [facility], [facility]

## 2023-05-25 NOTE — Progress Notes (Signed)
 Physical Therapy Treatment Patient Details Name: Alexandra Foster MRN: [id number] DOB: 1951/01/09 Today's Date: 05/25/2023   History of Present Illness Pt is a 73 y.o. female presenting to hospital 05/12/23 with c/o AMS; vomited at dialysis.  Pt admitted with sepsis d/t gram-negative UTI, aspiration PNA, and acute metabolic encephalopathy.  PMH includes ESRD on HD, OA, asthma, COPD, DM type 2, htn, paroxysmal a-fib, OSA on CPAP, trach, L AKA.    PT Comments  Pt initially sleeping, however is able to awaken and conversant with therapist. Offered to hoyer to chair x 3 attempts with pt refusal, saying she only gets up to chair during dialysis. Pt unwilling to participate in skilled therapy x multiple attempts. Pt is at baseline and will sign off - refer to HHPT at this time. MD aware.   If plan is discharge home, recommend the following: Two people to help with walking and/or transfers;Two people to help with bathing/dressing/bathroom;Assistance with cooking/housework;Assist for transportation;Help with stairs or ramp for entrance   Can travel by private vehicle        Equipment Recommendations  None recommended by PT    Recommendations for Other Services       Precautions / Restrictions Precautions Precautions: Fall Precaution Comments: PEG tube; R IJ perm cath Restrictions Weight Bearing Restrictions Per Provider Order: No Other Position/Activity Restrictions: L AKA     Mobility  Bed Mobility Overal bed mobility: Needs Assistance Bed Mobility: Rolling Rolling: Mod assist         General bed mobility comments: able to roll to R so therapist can scratch her back. Needs total assist for sliding up towards HOB. Attempted to reach for over head rail, however limited by ROM with B UEs    Transfers                   General transfer comment: offered to transfer to chair, however refused multiple times    Ambulation/Gait               General Gait Details: non  ambulatory at baseline   Stairs             Wheelchair Mobility     Tilt Bed    Modified Rankin (Stroke Patients Only)       Balance                                            Cognition Arousal: Alert Behavior During Therapy: Flat affect Overall Cognitive Status: No family/caregiver present to determine baseline cognitive functioning                                 General Comments: initially lethargic, however does become more alert with stimulation. Able to conversate this date        Exercises Other Exercises Other Exercises: demonstrates ability to stretch R LE to approx 90 degrees flexion. Able to demonstrate active movement in L AKA    General Comments        Pertinent Vitals/Pain Pain Assessment Pain Assessment: No/denies pain    Home Living                          Prior Function            PT Goals (  current goals can now be found in the care plan section) Acute Rehab PT Goals Patient Stated Goal: to improve overall strength PT Goal Formulation: With patient Time For Goal Achievement: 06/01/23 Potential to Achieve Goals: Fair Progress towards PT goals: Goals met/education completed, patient discharged from PT    Frequency    Min 1X/week      PT Plan      Co-evaluation              AM-PAC PT "6 Clicks" Mobility   Outcome Measure  Help needed turning from your back to your side while in a flat bed without using bedrails?: A Lot Help needed moving from lying on your back to sitting on the side of a flat bed without using bedrails?: Total Help needed moving to and from a bed to a chair (including a wheelchair)?: Total Help needed standing up from a chair using your arms (e.g., wheelchair or bedside chair)?: Total Help needed to walk in hospital room?: Total Help needed climbing 3-5 steps with a railing? : Total 6 Click Score: 7    End of Session   Activity Tolerance: Patient  tolerated treatment well Patient left: in bed;with call bell/phone within reach;with bed alarm set Nurse Communication: Mobility status;Precautions PT Visit Diagnosis: Other abnormalities of gait and mobility (R26.89);Muscle weakness (generalized) (M62.81)     Time: 1009-1020 PT Time Calculation (min) (ACUTE ONLY): 11 min  Charges:    $Therapeutic Activity: 8-22 mins PT General Charges $$ ACUTE PT VISIT: 1 Visit                     Amparo Balk, PT, DPT, GCS [phone]    [place] Luna 05/25/2023, 10:26 AM

## 2023-05-25 NOTE — Progress Notes (Signed)
 Progress Note   Patient: Alexandra Foster EPP:[id number] DOB: [birth year] DOA: 05/13/2023     12 DOS: the patient was seen and examined on 05/25/2023   Brief hospital course: 73 year old female with end-stage renal disease on hemodialysis, osteoarthritis, asthma, COPD, type 2 diabetes mellitus, GERD, hypertension and paroxysmal atrial fibrillation and obstructive sleep apnea on CPAP presented to the ER with confusion over the last 3 days with intermittent vomiting.  She was admitted with clinical sepsis secondary to right upper lobe pneumonia likely aspiration and urinary infection.  Started on antibiotic.  1/29.  some cough and shortness of breath.  Restarted tube feeds.  Fever of 103.  2/1.  Rapid response at dialysis.  Patient had large brown BM then was less responsive and has shortness of breath.  Bipap ordered.  ABG.  Iv albumin .  Spoke with nephrology to remove fluid.  CT scan showing worsening pneumonia.  Antibiotics switched to Zosyn  and vancomycin   2/2.  Respiratory status much improved.  Off BiPAP on 3 L of oxygen.  BP lower side increased midodrine  to 15 mg 3 times daily. 2/3. On 1 L O2.  Steroids discontinued. 2/4.  White blood cell count 30.1.  Diarrhea not documented.  I assumed care 05/20/23 WBC improved to 18.8k Pt remained on IV Zosyn  through 2/6, stopped having completed total of 10 days antibiotic coverage. When tried off antibiotics, WBC again increased. ID consulted and PO Zyvox  was started for additional MRSA coverage pending repeat cultures and attempt to identify any infection source.    Assessment and Plan: * Severe sepsis (HCC) Present on admission with leukocytosis fever tachycardia and tachypnea and acute respiratory failure.  Patient has right upper lobe pneumonia and Klebsiella growing out of urine culture.  WBC climbed to peak 30.1 (note pt was also on steroids). Antibiotics switched on 2/1 to Zosyn .  Monitored off after 10 days completed (2/6 >> 2/7). Off  steroids since 2/3. WBC increased when tried off antibiotics. ID consulted for recommendations Resumed on Linezolid  2/8 Repeat blood cultures pending ??Skin source.  No diarrhea. Wounds healthy appearing based on Wound Care RN description from 2/7.   Monitor fever curve, CBC and clinically for s/sx's of infection  Acute hypoxic respiratory failure (HCC) With respiratory distress on 2/1 was placed on BiPAP.  Last pulse ox on room air.  This problem has resolved.  Hypotension On midodrine  and florinef  for chronically low blood pressure.  Increased midodrine  to 15 mg 3 times daily.  Aspiration pneumonia (HCC) Multifocal pneumonia seen on CT scan.  Stop Zosyn . Completed total of 10 days antibiotics See Severe Sepsis   Acute metabolic encephalopathy Mental status improved.  ESRD on hemodialysis [facility]) Dialysis per Nephrology  Dyslipidemia On Lipitor  Acute on chronic diastolic CHF (congestive heart failure) (HCC) Likely more worsening pneumonia rather than heart failure.  Dialysis to manage fluid.  Hyperkalemia Improved with dialysis  Urinary tract infection due to Klebsiella species Treated with antibiotics  Pneumonia due to infectious organism See aspiration pneumonia  Itching On atarax  standing dose and as needed Benadryl   Stage II decubitus ulcer (HCC) Buttocks, see full description below, documented on 1/30.  Diarrhea As needed Imodium  and standing dose cholestyramine .  Obesity (BMI 30-39.9) BMI down to 29.71  Anxiety and depression On Klonopin  and Lexapro .        Subjective: Pt awake resting in bed when seen this AM.  She denies any complaints, reports feeling well overall.  No fever/chills, no diarrhea, nausea/vomiting, sore throat congestion.  Still  intermittent cough but not worsening.   Physical Exam: Vitals:   05/25/23 0447 05/25/23 0500 05/25/23 0826 05/25/23 1238  BP: (!) 125/57  (!) 106/56 (!) 114/55  Pulse: 62  63 64  Resp: 20     Temp:  98.8 F (37.1 C)  98.5 F (36.9 C)   TempSrc: Axillary     SpO2: 98%  99% 100%  Weight:  80.9 kg    Height:       General exam: awake, alert, no acute distress, chronically ill appearing HEENT: moist mucus membranes, hearing grossly normal  Respiratory system: lungs clear diminished bases due to poor inspiratory volumes, no wheezes, normal respiratory effort on 3 L/min [place] o2. Cardiovascular system: normal S1/S2, RRR, perm cath right upper chest accessed for dialysis Gastrointestinal system: soft, NT, ND Central nervous system: A&O x 2+. no gross focal neurologic deficits, normal speech Skin: dry flaky skin, normal temperature Psychiatry: normal mood, flat affect      Data Reviewed:   Notable labs  --  Na 133 Glucose 112 BUN 57 Cr 3.77  WBC trending 30.1 >> 18.8 >> 17.9 >>21.3 (resumed abx) >> 19.5 >> 22.3  Hbg stable 10.2 >> 10.2 >> 9.6 >> 9.9 >> 10.4     Family Communication: Daughter Alexandra Foster updated by phone 2/7 and 2/8. Will attempt to call as time allows.    Disposition: Status is: Inpatient Remains inpatient appropriate because: worsening/persistent leukocytosis resumed on antibiotics 2/8, pending evaluation including repeat cultures.    Planned Discharge Destination: Home with Home Health    Time spent: 42 minutes  Author: Montey Apa, DO 05/25/2023 2:53 PM  For on call review [email or web address].

## 2023-05-25 NOTE — Progress Notes (Signed)
 [facility]  ROUNDING NOTE   Subjective:   Alexandra Foster is a 73 y.o. female with history of hypertension, coronary artery disease, congestive heart failure, COPD, cirrhosis, diabetes, status post left AKA, status post G-tube placement, and end stage renal disease on hemodialysis.  Patient presents to ED from dialysis at the request of family due to altered mental status.  Patient has been admitted for Lower urinary tract infectious disease [N39.0] Altered mental status, unspecified altered mental status type [R41.82] Pneumonia due to infectious organism, unspecified laterality, unspecified part of lung [J18.9] Sepsis due to gram-negative UTI (HCC) [A41.50, N39.0]  Patient is known to our practice from previous admissions and receives outpatient dialysis treatments at [facility] on a TTS schedule, supervised by [facility].    Update  Patient seen resting, eyes closed No family present at bedside Tube feeds in place  Will plan for dialysis tomorrow  Objective:  Vital signs in last 24 hours:  Temp:  [98.3 F (36.8 C)-98.8 F (37.1 C)] 98.5 F (36.9 C) (02/10 0826) Pulse Rate:  [62-64] 64 (02/10 1238) Resp:  [17-20] 20 (02/10 0447) BP: (104-126)/(52-57) 114/55 (02/10 1238) SpO2:  [97 %-100 %] 100 % (02/10 1238) Weight:  [80.9 kg] 80.9 kg (02/10 0500)  Weight change: 1.3 kg Filed Weights   05/23/23 1253 05/24/23 0518 05/25/23 0500  Weight: 78 kg 78.2 kg 80.9 kg    Intake/Output: No intake/output data recorded.   Intake/Output this shift:  No intake/output data recorded.  Physical Exam: General: NAD  Head: Normocephalic, atraumatic. Moist oral mucosal membranes  Eyes: Anicteric  Lungs:  Clear to auscultation, normal effort  Heart: Regular rate and rhythm  Abdomen:  Soft, nontender, obese  Extremities:  trace peripheral edema.  Neurologic: Alert and oriented to self, moving all four extremities  Skin: No lesions, dry, flaky  Access:  Rt chest permcath     Basic Metabolic Panel: Recent Labs  Lab 05/19/23 0653 05/21/23 0636 05/23/23 0402 05/25/23 0941  NA 133* 135 136 133*  K 3.7 3.5 3.3* 4.0  CL 97* 97* 100 99  CO2 19* 22 26 23   GLUCOSE 122* 95 115* 112*  BUN 105* 78* 19 57*  CREATININE 5.87* 4.45* 0.90 3.77*  CALCIUM  9.3 9.4 8.6* 9.6  PHOS  --  4.7* 2.9  --     Liver Function Tests: Recent Labs  Lab 05/19/23 0805 05/21/23 0636 05/23/23 0402  ALBUMIN  2.2* 2.1* 3.3*   No results for input(s): "LIPASE", "AMYLASE" in the last 168 hours. No results for input(s): "AMMONIA" in the last 168 hours.  CBC: Recent Labs  Lab 05/21/23 0636 05/22/23 0516 05/23/23 0402 05/24/23 0412 05/25/23 0941  WBC 17.9* 18.9* 21.3* 19.5* 22.3*  NEUTROABS  --   --  12.4* 12.8*  --   HGB 10.2* 9.8* 9.6* 9.9* 10.4*  HCT 32.4* 30.9* 29.7* 31.0* 32.6*  MCV 82.4 82.0 83.0 82.9 83.8  PLT 391 412* 453* 552* 623*    Cardiac Enzymes: No results for input(s): "CKTOTAL", "CKMB", "CKMBINDEX", "TROPONINI" in the last 168 hours.  BNP: Invalid input(s): "POCBNP"  CBG: Recent Labs  Lab 05/23/23 1719  GLUCAP 88    Microbiology: Results for orders placed or performed during the hospital encounter of 05/13/23  Resp panel by RT-PCR (RSV, Flu A&B, Covid) Anterior Nasal Swab     Status: None   Collection Time: 05/12/23  4:37 PM   Specimen: Anterior Nasal Swab  Result Value Ref Range Status   SARS Coronavirus 2  by RT PCR NEGATIVE NEGATIVE Final    Comment: (NOTE) SARS-CoV-2 target nucleic acids are NOT DETECTED.  The SARS-CoV-2 RNA is generally detectable in upper respiratory specimens during the acute phase of infection. The lowest concentration of SARS-CoV-2 viral copies this assay can detect is 138 copies/mL. A negative result does not preclude SARS-Cov-2 infection and should not be used as the sole basis for treatment or other patient management decisions. A negative result may occur with  improper specimen  collection/handling, submission of specimen other than nasopharyngeal swab, presence of viral mutation(s) within the areas targeted by this assay, and inadequate number of viral copies(<138 copies/mL). A negative result must be combined with clinical observations, patient history, and epidemiological information. The expected result is Negative.  Fact Sheet for Patients:  BloggerCourse.com  Fact Sheet for Healthcare Providers:  SeriousBroker.it  This test is no t yet approved or cleared by the [place]  FDA and  has been authorized for detection and/or diagnosis of SARS-CoV-2 by FDA under an Emergency Use Authorization (EUA). This EUA will remain  in effect (meaning this test can be used) for the duration of the COVID-19 declaration under Section 564(b)(1) of the Act, 21 U.S.C.section 360bbb-3(b)(1), unless the authorization is terminated  or revoked sooner.       Influenza A by PCR NEGATIVE NEGATIVE Final   Influenza B by PCR NEGATIVE NEGATIVE Final    Comment: (NOTE) The Xpert Xpress SARS-CoV-2/FLU/RSV plus assay is intended as an aid in the diagnosis of influenza from Nasopharyngeal swab specimens and should not be used as a sole basis for treatment. Nasal washings and aspirates are unacceptable for Xpert Xpress SARS-CoV-2/FLU/RSV testing.  Fact Sheet for Patients: BloggerCourse.com  Fact Sheet for Healthcare Providers: SeriousBroker.it  This test is not yet approved or cleared by the [place]  FDA and has been authorized for detection and/or diagnosis of SARS-CoV-2 by FDA under an Emergency Use Authorization (EUA). This EUA will remain in effect (meaning this test can be used) for the duration of the COVID-19 declaration under Section 564(b)(1) of the Act, 21 U.S.C. section 360bbb-3(b)(1), unless the authorization is terminated or revoked.     Resp Syncytial  Virus by PCR NEGATIVE NEGATIVE Final    Comment: (NOTE) Fact Sheet for Patients: BloggerCourse.com  Fact Sheet for Healthcare Providers: SeriousBroker.it  This test is not yet approved or cleared by the [place]  FDA and has been authorized for detection and/or diagnosis of SARS-CoV-2 by FDA under an Emergency Use Authorization (EUA). This EUA will remain in effect (meaning this test can be used) for the duration of the COVID-19 declaration under Section 564(b)(1) of the Act, 21 U.S.C. section 360bbb-3(b)(1), unless the authorization is terminated or revoked.  Performed at [facility], [address] [place], [place] [address]   Culture, blood (routine x 2)     Status: None   Collection Time: 05/12/23 11:45 PM   Specimen: BLOOD  Result Value Ref Range Status   Specimen Description BLOOD RIGHT ANTECUBITAL  Final   Special Requests   Final    BOTTLES DRAWN AEROBIC AND ANAEROBIC Blood Culture adequate volume   Culture   Final    NO GROWTH 5 DAYS Performed at [facility], [address] [place], [place] [address]    Report Status 05/18/2023 FINAL  Final  Culture, blood (routine x 2)     Status: None   Collection Time: 05/13/23 12:00 AM   Specimen: BLOOD  Result Value Ref Range Status   Specimen  Description BLOOD LEFT WRIST  Final   Special Requests   Final    AEROBIC BOTTLE ONLY Blood Culture results may not be optimal due to an inadequate volume of blood received in culture bottles   Culture   Final    NO GROWTH 5 DAYS Performed at [facility], [address] [place], [place] [address]    Report Status 05/18/2023 FINAL  Final  Urine Culture (for pregnant, neutropenic or urologic patients or patients with an indwelling urinary catheter)     Status: Abnormal   Collection Time: 05/13/23 12:10 AM   Specimen: Urine, Clean Catch  Result Value Ref Range Status   Specimen Description   Final     URINE, CLEAN CATCH Performed at [facility], [address], [place], [place] [address]    Special Requests   Final    NONE Performed at [facility], [address] [place], [place] [address]    Culture >=100,000 COLONIES/mL KLEBSIELLA PNEUMONIAE (A)  Final   Report Status 05/15/2023 FINAL  Final   Organism ID, Bacteria KLEBSIELLA PNEUMONIAE (A)  Final      Susceptibility   Klebsiella pneumoniae - MIC*    AMPICILLIN  RESISTANT Resistant     CEFAZOLIN  <=4 SENSITIVE Sensitive     CEFEPIME  <=0.12 SENSITIVE Sensitive     CEFTRIAXONE  <=0.25 SENSITIVE Sensitive     CIPROFLOXACIN <=0.25 SENSITIVE Sensitive     GENTAMICIN  <=1 SENSITIVE Sensitive     IMIPENEM <=0.25 SENSITIVE Sensitive     NITROFURANTOIN <=16 SENSITIVE Sensitive     TRIMETH/SULFA <=20 SENSITIVE Sensitive     AMPICILLIN /SULBACTAM 4 SENSITIVE Sensitive     PIP/TAZO <=4 SENSITIVE Sensitive ug/mL    * >=100,000 COLONIES/mL KLEBSIELLA PNEUMONIAE  MRSA Next Gen by PCR, Nasal     Status: Abnormal   Collection Time: 05/14/23  4:30 PM   Specimen: Nasal Mucosa; Nasal Swab  Result Value Ref Range Status   MRSA by PCR Next Gen DETECTED (A) NOT DETECTED Final    Comment: RESULT CALLED TO, READ BACK BY AND VERIFIED WITH: DORATHY MUHORO @1908  ON 05/14/23 SKL (NOTE) The GeneXpert MRSA Assay (FDA approved for NASAL specimens only), is one component of a comprehensive MRSA colonization surveillance program. It is not intended to diagnose MRSA infection nor to guide or monitor treatment for MRSA infections. Test performance is not FDA approved in patients less than 56 years old. Performed at [facility], [address] [place], [place] [address]   Culture, blood (Routine X 2) w Reflex to ID Panel     Status: None (Preliminary result)   Collection Time: 05/22/23  3:04 PM   Specimen: BLOOD RIGHT ARM  Result Value Ref Range Status   Specimen Description BLOOD RIGHT ARM  Final   Special Requests    Final    BOTTLES DRAWN AEROBIC ONLY Blood Culture results may not be optimal due to an inadequate volume of blood received in culture bottles   Culture   Final    NO GROWTH 3 DAYS Performed at [facility], [address], [place], [place] [address]    Report Status PENDING  Incomplete  Culture, blood (Routine X 2) w Reflex to ID Panel     Status: None (Preliminary result)   Collection Time: 05/22/23  3:04 PM   Specimen: BLOOD RIGHT HAND  Result Value Ref Range Status   Specimen Description BLOOD RIGHT HAND  Final   Special Requests   Final    BOTTLES  DRAWN AEROBIC ONLY Blood Culture results may not be optimal due to an inadequate volume of blood received in culture bottles   Culture   Final    NO GROWTH 3 DAYS Performed at [facility], [address] [place], [place] [address]    Report Status PENDING  Incomplete    Coagulation Studies: No results for input(s): "LABPROT", "INR" in the last 72 hours.   Urinalysis: No results for input(s): "COLORURINE", "LABSPEC", "PHURINE", "GLUCOSEU", "HGBUR", "BILIRUBINUR", "KETONESUR", "PROTEINUR", "UROBILINOGEN", "NITRITE", "LEUKOCYTESUR" in the last 72 hours.  Invalid input(s): "APPERANCEUR"     Imaging: No results found.    Medications:    feeding supplement (NEPRO CARB STEADY) 1,000 mL (05/25/23 0453)    ascorbic acid   250 mg Per Tube Daily   atorvastatin   20 mg Per Tube Daily   vitamin D3  2,000 Units Per Tube Daily   cholestyramine   4 g Per Tube TID   epoetin  alfa-epbx (RETACRIT ) injection  4,000 Units Intravenous Q T,Th,Sa-HD   escitalopram   10 mg Per Tube Daily   famotidine   10 mg Per Tube Daily   fludrocortisone   0.1 mg Per Tube Daily   free water   30 mL Per Tube Q4H   heparin  injection (subcutaneous)  5,000 Units Subcutaneous Q8H   hydrOXYzine   50 mg Per Tube TID   linezolid   600 mg Per Tube Q12H   metoCLOPramide   5 mg Oral TID AC & HS   midodrine   15 mg Per Tube TID with meals   multivitamin   1 tablet Oral QHS   nutrition supplement (JUVEN)  1 packet Per Tube BID BM   nystatin    Topical TID   mouth rinse  15 mL Mouth Rinse 4 times per day   acetaminophen  **OR** acetaminophen , chlorpheniramine-HYDROcodone, diphenhydrAMINE , ipratropium-albuterol , liver oil-zinc  oxide, loperamide  HCl, LORazepam , magnesium  hydroxide, ondansetron  **OR** ondansetron  (ZOFRAN ) IV, mouth rinse, traZODone   Assessment/ Plan:  [name] is a 73 y.o.  female with history of hypertension, coronary artery disease, congestive heart failure, COPD, cirrhosis, diabetes, status post left AKA, status post G-tube placement, and end stage renal disease on hemodialysis.  Patient presents to ED from dialysis at the request of family due to altered mental status.  Patient has been admitted for Lower urinary tract infectious disease [N39.0] Altered mental status, unspecified altered mental status type [R41.82] Pneumonia due to infectious organism, unspecified laterality, unspecified part of lung [J18.9] Sepsis due to gram-negative UTI (HCC) [A41.50, N39.0]  [facility]/TTs/Rt permcath/75.6kg  End-stage renal disease:  Next treatment scheduled for Tuesday.  #2.  Sepsis, gram negative UTI. Received Lactated ringers  in ED. Urine culture with Klebsiella.  Completed antibiotic regimen but restarted due to family concern.  White count peaked at 30.1.  ID consulted  #3: Anemia of chronic kidney disease Lab Results  Component Value Date   HGB 10.4 (L) 05/25/2023  Hemoglobin within optimal range. Continue low dose EPO with dialysis.    #4: Secondary Hyperparathyroidism: with outpatient labs: PTH 35, phosphorus 7.6, calcium  9.4 on 04/21/23.   Lab Results  Component Value Date   CALCIUM  9.6 05/25/2023   PHOS 2.9 05/23/2023    Bone minerals acceptable at this time.    LOS: 12 Menno Vanbergen 2/10/20251:40 PM

## 2023-05-25 NOTE — TOC Progression Note (Addendum)
 Transition of Care [facility]) - Progression Note    Patient Details  Name: Alexandra Foster MRN: [id number] Date of Birth: 1950/08/07  [facility]) CM/SW Contact  Baird Bombard, RN Phone Number: 05/25/2023, 2:54 PM  Clinical Narrative:    Attempt to arrange [facility] PT/ OT/ RN. No answer. At daughter or spouses contact numbers. Left messages requesting return calls.   4:00pm Retrieved call from patient's daughter. Per patient's daughter, patient was active with [facility] before admission. [facility] notified.          Expected Discharge Plan and Services                                               Social Determinants of Health (SDOH) Interventions SDOH Screenings   Food Insecurity: No Food Insecurity (05/14/2023)  Housing: Low Risk  (05/14/2023)  Transportation Needs: No Transportation Needs (05/14/2023)  Recent Concern: Transportation Needs - Unmet Transportation Needs (03/10/2023)  Utilities: Not At Risk (05/14/2023)  Alcohol  Screen: Low Risk  (02/11/2021)  Depression (PHQ2-9): Low Risk  (03/20/2023)  Recent Concern: Depression (PHQ2-9) - Medium Risk (02/27/2023)  Financial Resource Strain: Medium Risk (08/09/2020)  Physical Activity: Inactive (08/09/2020)  Social Connections: Moderately Integrated (05/14/2023)  Stress: No Stress Concern Present (08/09/2020)  Tobacco Use: Medium Risk (05/14/2023)    Readmission Risk Interventions    02/16/2023   12:26 PM 12/26/2022   11:15 AM  Readmission Risk Prevention Plan  Transportation Screening Complete Complete  PCP or Specialist Appt within 3-5 Days Complete   HRI or Home Care Consult Complete   Social Work Consult for Recovery Care Planning/Counseling Complete   Palliative Care Screening Not Applicable   Medication Review [facility]) Complete Complete  PCP or Specialist appointment within 3-5 days of discharge  Complete  HRI or Home Care Consult  Complete  SW Recovery Care/Counseling Consult  Complete   Palliative Care Screening  Not Applicable  Skilled Nursing Facility  Not Applicable

## 2023-05-25 NOTE — Progress Notes (Signed)
 Occupational Therapy Treatment Patient Details Name: Alexandra Foster MRN: [id number] DOB: August 11, 1950 Today's Date: 05/25/2023   History of present illness Pt is a 73 y.o. female presenting to hospital 05/12/23 with c/o AMS; vomited at dialysis.  Pt admitted with sepsis d/t gram-negative UTI, aspiration PNA, and acute metabolic encephalopathy.  PMH includes ESRD on HD, OA, asthma, COPD, DM type 2, htn, paroxysmal a-fib, OSA on CPAP, trach, L AKA.   OT comments  Pt received with sister in room. Sister was providing care to pt for self-care needs including changing brief, donning new gown and applying lotion. Pt able to raise BUE to help with gown, and participates in UE exercises with encouragement from sister. Decreased ROM to R elbow and R digits due to non-use. PROM provided to assist with joint mobility and integrity. Pt performing additional UB exercises to improve ability to perform self-care tasks, fatiguing quickly and requiring rest breaks after each set. When sister leaves, pt closes eyes and does not want to engage. Discharge recommendation appropriate, OT will continue to follow.       If plan is discharge home, recommend the following:  Two people to help with bathing/dressing/bathroom;Two people to help with walking and/or transfers   Equipment Recommendations  None recommended by OT    Recommendations for Other Services Other (comment)    Precautions / Restrictions Precautions Precautions: Fall Precaution Comments: PEG tube; R IJ perm cath Restrictions Weight Bearing Restrictions Per Provider Order: No Other Position/Activity Restrictions: L AKA       Mobility Bed Mobility Overal bed mobility: Needs Assistance Bed Mobility: Rolling Rolling: Mod assist              Transfers                   General transfer comment: NT, pt hoyer at baseline         ADL either performed or assessed with clinical judgement   ADL                        Lower Body Dressing: Maximal assistance;Bed level Lower Body Dressing Details (indicate cue type and reason): maxA to don sock bed level, RLE positioned with heel floated and legs in neutral               General ADL Comments: Sister in room giving patient a bath, pericare and applying oil due to flaky skin.    Extremity/Trunk Assessment Upper Extremity Assessment Upper Extremity Assessment: RUE deficits/detail RUE Deficits / Details: Decreased R elbow ROM (likely contracture forming due to non-use, R hand with non-functional grasp (digits in extension and little PROM at DIP/PIP). Pt unable to make a composite fist             Cognition Arousal: Alert Behavior During Therapy: Flat affect                                   General Comments: participated in session with sister insisting pt work on getting stronger. pt continually asks for her back to be scratched, and requires constant cuing to participate throughout. when sister leaves, pt immediatly shuts eyes and stops engaging        Exercises Exercises: General Upper Extremity, Hand exercises General Exercises - Upper Extremity Shoulder Flexion: Strengthening, Both, 20 reps, Supine, AROM Shoulder Horizontal ABduction: AROM, Both, 20 reps, Supine Elbow Flexion: Both, AROM,  Supine, 10 reps Elbow Extension: 10 reps, AROM, Supine, Strengthening, Both Hand Exercises Wrist Flexion: PROM, AROM, Both, Supine, 10 reps Wrist Extension: PROM, AROM, Both, 10 reps, Supine Digit Composite Flexion: PROM, Right, 5 reps, Supine Thumb Abduction: PROM, Supine, 5 reps Other Exercises Other Exercises: discussed with pt importance of moving UE/LE and rolling       General Comments Skin dry, and flaky. Fingers in socks, noted to be sticking together and difficult to move apart. Sister was helping pt clean webspaces between fingers and toes    Pertinent Vitals/ Pain       Pain Assessment Pain Assessment: No/denies  pain   Frequency  Min 1X/week        Progress Toward Goals  OT Goals(current goals can now be found in the care plan section)  Progress towards OT goals: Progressing toward goals  Acute Rehab OT Goals OT Goal Formulation: With patient/family Time For Goal Achievement: 06/03/23 Potential to Achieve Goals: Fair ADL Goals Pt Will Perform Grooming: with min assist;bed level Pt/caregiver will Perform Home Exercise Program: Increased strength;Both right and left upper extremity;With theraband;With minimal assist;With written HEP provided  Plan         AM-PAC OT "6 Clicks" Daily Activity     Outcome Measure   Help from another person eating meals?: Total Help from another person taking care of personal grooming?: Total Help from another person toileting, which includes using toliet, bedpan, or urinal?: Total Help from another person bathing (including washing, rinsing, drying)?: Total Help from another person to put on and taking off regular upper body clothing?: Total Help from another person to put on and taking off regular lower body clothing?: Total 6 Click Score: 6    End of Session    OT Visit Diagnosis: Other abnormalities of gait and mobility (R26.89);Muscle weakness (generalized) (M62.81)   Activity Tolerance Patient limited by fatigue   Patient Left in bed;with call bell/phone within reach;with bed alarm set   Nurse Communication Mobility status        Time: 1610-9604 OT Time Calculation (min): 24 min  Charges: OT General Charges $OT Visit: 1 Visit OT Treatments $Therapeutic Activity: 23-37 mins  Crystie Yanko L. Gillian Meeuwsen, OTR/L  05/25/23, 1:01 PM

## 2023-05-25 NOTE — Progress Notes (Signed)
 Report given to Tammy, 2C Nurse assuming care of patient. All outstanding questions resolved.

## 2023-05-25 NOTE — Plan of Care (Signed)

## 2023-05-25 NOTE — Care Management Important Message (Signed)
 Important Message  Patient Details  Name: Alexandra Foster MRN: [id number] Date of Birth: September 15, 1950   Important Message Given:  Yes - Medicare IM     Felix Host 05/25/2023, 3:17 PM

## 2023-05-26 DIAGNOSIS — D72829 Elevated white blood cell count, unspecified: Secondary | ICD-10-CM | POA: Diagnosis not present

## 2023-05-26 DIAGNOSIS — A419 Sepsis, unspecified organism: Secondary | ICD-10-CM | POA: Diagnosis not present

## 2023-05-26 DIAGNOSIS — R6520 Severe sepsis without septic shock: Secondary | ICD-10-CM | POA: Diagnosis not present

## 2023-05-26 DIAGNOSIS — D75839 Thrombocytosis, unspecified: Secondary | ICD-10-CM

## 2023-05-26 LAB — TECHNOLOGIST SMEAR REVIEW: Plt Morphology: NORMAL

## 2023-05-26 LAB — CBC WITH DIFFERENTIAL/PLATELET
Abs Immature Granulocytes: 0.32 10*3/uL — ABNORMAL HIGH (ref 0.00–0.07)
Basophils Absolute: 0.1 10*3/uL (ref 0.0–0.1)
Basophils Relative: 1 %
Eosinophils Absolute: 3.2 10*3/uL — ABNORMAL HIGH (ref 0.0–0.5)
Eosinophils Relative: 15 %
HCT: 31 % — ABNORMAL LOW (ref 36.0–46.0)
Hemoglobin: 9.7 g/dL — ABNORMAL LOW (ref 12.0–15.0)
Immature Granulocytes: 2 %
Lymphocytes Relative: 11 %
Lymphs Abs: 2.4 10*3/uL (ref 0.7–4.0)
MCH: 26.2 pg (ref 26.0–34.0)
MCHC: 31.3 g/dL (ref 30.0–36.0)
MCV: 83.8 fL (ref 80.0–100.0)
Monocytes Absolute: 1.1 10*3/uL — ABNORMAL HIGH (ref 0.1–1.0)
Monocytes Relative: 5 %
Neutro Abs: 14.4 10*3/uL — ABNORMAL HIGH (ref 1.7–7.7)
Neutrophils Relative %: 66 %
Platelets: 653 10*3/uL — ABNORMAL HIGH (ref 150–400)
RBC: 3.7 MIL/uL — ABNORMAL LOW (ref 3.87–5.11)
RDW: 18.3 % — ABNORMAL HIGH (ref 11.5–15.5)
Smear Review: NORMAL
WBC: 21.6 10*3/uL — ABNORMAL HIGH (ref 4.0–10.5)
nRBC: 0.1 % (ref 0.0–0.2)

## 2023-05-26 LAB — RENAL FUNCTION PANEL
Albumin: 2.4 g/dL — ABNORMAL LOW (ref 3.5–5.0)
Anion gap: 10 (ref 5–15)
BUN: 81 mg/dL — ABNORMAL HIGH (ref 8–23)
CO2: 22 mmol/L (ref 22–32)
Calcium: 9.4 mg/dL (ref 8.9–10.3)
Chloride: 98 mmol/L (ref 98–111)
Creatinine, Ser: 4.35 mg/dL — ABNORMAL HIGH (ref 0.44–1.00)
GFR, Estimated: 10 mL/min — ABNORMAL LOW (ref >60)
Glucose, Bld: 88 mg/dL (ref 70–99)
Phosphorus: 6.6 mg/dL — ABNORMAL HIGH (ref 2.5–4.6)
Potassium: 4.1 mmol/L (ref 3.5–5.1)
Sodium: 130 mmol/L — ABNORMAL LOW (ref 135–145)

## 2023-05-26 LAB — RETIC PANEL
Immature Retic Fract: 30.9 % — ABNORMAL HIGH (ref 2.3–15.9)
RBC.: 3.7 MIL/uL — ABNORMAL LOW (ref 3.87–5.11)
Retic Count, Absolute: 123.2 10*3/uL (ref 19.0–186.0)
Retic Ct Pct: 3.3 % — ABNORMAL HIGH (ref 0.4–3.1)
Reticulocyte Hemoglobin: 29.3 pg (ref >27.9)

## 2023-05-26 LAB — FERRITIN: Ferritin: 256 ng/mL (ref 11–307)

## 2023-05-26 LAB — IRON AND TIBC
Iron: 46 ug/dL (ref 28–170)
Saturation Ratios: 15 % (ref 10.4–31.8)
TIBC: 315 ug/dL (ref 250–450)
UIBC: 269 ug/dL

## 2023-05-26 MED ORDER — FERROUS SULFATE 75 (15 FE) MG/ML PO SOLN
60.0000 mg | Freq: Every day | ORAL | Status: DC
  Administered 2023-05-27: 60 mg
  Filled 2023-05-26: qty 4

## 2023-05-26 MED ORDER — HEPARIN SODIUM (PORCINE) 1000 UNIT/ML IJ SOLN
INTRAMUSCULAR | Status: AC
Start: 2023-05-26 — End: ?
  Filled 2023-05-26: qty 10

## 2023-05-26 MED ORDER — ONDANSETRON HCL 4 MG/2ML IJ SOLN
INTRAMUSCULAR | Status: AC
Start: 2023-05-26 — End: ?
  Filled 2023-05-26: qty 2

## 2023-05-26 MED ORDER — EPOETIN ALFA-EPBX 4000 UNIT/ML IJ SOLN
INTRAMUSCULAR | Status: AC
  Filled 2023-05-26: qty 1

## 2023-05-26 NOTE — Progress Notes (Signed)
Patient stated that she is feeling sick and wants to be taken off the HD Machine. Given Zofran and paused UF but repeatedly states "take me off the machine". Waiver was signed and HD NP was notified.

## 2023-05-26 NOTE — Progress Notes (Signed)
[facility]  ROUNDING NOTE   Subjective:   Alexandra Foster is a 73 y.o. female with history of hypertension, coronary artery disease, congestive heart failure, COPD, cirrhosis, diabetes, status post left AKA, status post G-tube placement, and end stage renal disease on hemodialysis.  Patient presents to ED from dialysis at the request of family due to altered mental status.  Patient has been admitted for Lower urinary tract infectious disease [N39.0] Altered mental status, unspecified altered mental status type [R41.82] Pneumonia due to infectious organism, unspecified laterality, unspecified part of lung [J18.9] Sepsis due to gram-negative UTI (HCC) [A41.50, N39.0]  Patient is known to our practice from previous admissions and receives outpatient dialysis treatments at [facility] on a TTS schedule, supervised [facility].    Update  Patient seen and evaluated during dialysis   HEMODIALYSIS FLOWSHEET:  Blood Flow Rate (mL/min): 400 mL/min Arterial Pressure (mmHg): -195.34 mmHg Venous Pressure (mmHg): 163.44 mmHg TMP (mmHg): -13.13 mmHg Ultrafiltration Rate (mL/min): 686 mL/min Dialysate Flow Rate (mL/min): 300 ml/min Dialysis Fluid Bolus: Normal Saline  Resting comfortably during dialysis  Objective:  Vital signs in last 24 hours:  Temp:  [97.9 F (36.6 C)-99 F (37.2 C)] 98.3 F (36.8 C) (02/11 0800) Pulse Rate:  [58-75] 75 (02/11 0900) Resp:  [14-19] 18 (02/11 0900) BP: (107-129)/(55-88) 111/88 (02/11 0900) SpO2:  [98 %-100 %] 98 % (02/11 0900) Weight:  [79.1 kg-81.5 kg] 79.1 kg (02/11 0800)  Weight change: 0.6 kg Filed Weights   05/25/23 0500 05/26/23 0500 05/26/23 0800  Weight: 80.9 kg 81.5 kg 79.1 kg    Intake/Output: No intake/output data recorded.   Intake/Output this shift:  No intake/output data recorded.  Physical Exam: General: NAD  Head: Normocephalic, atraumatic. Moist oral mucosal membranes  Eyes: Anicteric   Lungs:  Clear to auscultation, normal effort  Heart: Regular rate and rhythm  Abdomen:  Soft, nontender, obese  Extremities:  trace peripheral edema.  Neurologic: Alert and oriented to self, moving all four extremities  Skin: No lesions, dry, flaky  Access: Rt chest permcath     Basic Metabolic Panel: Recent Labs  Lab 05/21/23 0636 05/23/23 0402 05/25/23 0941 05/26/23 0811  NA 135 136 133* 130*  K 3.5 3.3* 4.0 4.1  CL 97* 100 99 98  CO2 22 26 23 22   GLUCOSE 95 115* 112* 88  BUN 78* 19 57* 81*  CREATININE 4.45* 0.90 3.77* 4.35*  CALCIUM 9.4 8.6* 9.6 9.4  PHOS 4.7* 2.9  --  6.6*    Liver Function Tests: Recent Labs  Lab 05/21/23 0636 05/23/23 0402 05/26/23 0811  ALBUMIN 2.1* 3.3* 2.4*   No results for input(s): "LIPASE", "AMYLASE" in the last 168 hours. No results for input(s): "AMMONIA" in the last 168 hours.  CBC: Recent Labs  Lab 05/22/23 0516 05/23/23 0402 05/24/23 0412 05/25/23 0941 05/26/23 0526  WBC 18.9* 21.3* 19.5* 22.3* 21.6*  NEUTROABS  --  12.4* 12.8*  --  14.4*  HGB 9.8* 9.6* 9.9* 10.4* 9.7*  HCT 30.9* 29.7* 31.0* 32.6* 31.0*  MCV 82.0 83.0 82.9 83.8 83.8  PLT 412* 453* 552* 623* 653*    Cardiac Enzymes: No results for input(s): "CKTOTAL", "CKMB", "CKMBINDEX", "TROPONINI" in the last 168 hours.  BNP: Invalid input(s): "POCBNP"  CBG: Recent Labs  Lab 05/23/23 1719 05/25/23 1717  GLUCAP 88 111*    Microbiology: Results for orders placed or performed during the hospital encounter of 05/13/23  Resp panel by RT-PCR (RSV, Flu A&B, Covid) Anterior Nasal  Swab     Status: None   Collection Time: 05/12/23  4:37 PM   Specimen: Anterior Nasal Swab  Result Value Ref Range Status   SARS Coronavirus 2 by RT PCR NEGATIVE NEGATIVE Final    Comment: (NOTE) SARS-CoV-2 target nucleic acids are NOT DETECTED.  The SARS-CoV-2 RNA is generally detectable in upper respiratory specimens during the acute phase of infection. The lowest concentration of  SARS-CoV-2 viral copies this assay can detect is 138 copies/mL. A negative result does not preclude SARS-Cov-2 infection and should not be used as the sole basis for treatment or other patient management decisions. A negative result may occur with  improper specimen collection/handling, submission of specimen other than nasopharyngeal swab, presence of viral mutation(s) within the areas targeted by this assay, and inadequate number of viral copies(<138 copies/mL). A negative result must be combined with clinical observations, patient history, and epidemiological information. The expected result is Negative.  Fact Sheet for Patients:  BloggerCourse.com  Fact Sheet for Healthcare Providers:  SeriousBroker.it  This test is no t yet approved or cleared by the [place] FDA and  has been authorized for detection and/or diagnosis of SARS-CoV-2 by FDA under an Emergency Use Authorization (EUA). This EUA will remain  in effect (meaning this test can be used) for the duration of the COVID-19 declaration under Section 564(b)(1) of the Act, 21 U.S.C.section 360bbb-3(b)(1), unless the authorization is terminated  or revoked sooner.       Influenza A by PCR NEGATIVE NEGATIVE Final   Influenza B by PCR NEGATIVE NEGATIVE Final    Comment: (NOTE) The Xpert Xpress SARS-CoV-2/FLU/RSV plus assay is intended as an aid in the diagnosis of influenza from Nasopharyngeal swab specimens and should not be used as a sole basis for treatment. Nasal washings and aspirates are unacceptable for Xpert Xpress SARS-CoV-2/FLU/RSV testing.  Fact Sheet for Patients: BloggerCourse.com  Fact Sheet for Healthcare Providers: SeriousBroker.it  This test is not yet approved or cleared by the [place] FDA and has been authorized for detection and/or diagnosis of SARS-CoV-2 by FDA under an Emergency Use  Authorization (EUA). This EUA will remain in effect (meaning this test can be used) for the duration of the COVID-19 declaration under Section 564(b)(1) of the Act, 21 U.S.C. section 360bbb-3(b)(1), unless the authorization is terminated or revoked.     Resp Syncytial Virus by PCR NEGATIVE NEGATIVE Final    Comment: (NOTE) Fact Sheet for Patients: BloggerCourse.com  Fact Sheet for Healthcare Providers: SeriousBroker.it  This test is not yet approved or cleared by the [place] FDA and has been authorized for detection and/or diagnosis of SARS-CoV-2 by FDA under an Emergency Use Authorization (EUA). This EUA will remain in effect (meaning this test can be used) for the duration of the COVID-19 declaration under Section 564(b)(1) of the Act, 21 U.S.C. section 360bbb-3(b)(1), unless the authorization is terminated or revoked.  Performed at [facility], [address] [place], [place] [address]   Culture, blood (routine x 2)     Status: None   Collection Time: 05/12/23 11:45 PM   Specimen: BLOOD  Result Value Ref Range Status   Specimen Description BLOOD RIGHT ANTECUBITAL  Final   Special Requests   Final    BOTTLES DRAWN AEROBIC AND ANAEROBIC Blood Culture adequate volume   Culture   Final    NO GROWTH 5 DAYS Performed at [facility], [address] [place], [place] [address]    Report Status 05/18/2023 FINAL  Final  Culture, blood (routine x 2)     Status: None   Collection Time: 05/13/23 12:00 AM   Specimen: BLOOD  Result Value Ref Range Status   Specimen Description BLOOD LEFT WRIST  Final   Special Requests   Final    AEROBIC BOTTLE ONLY Blood Culture results may not be optimal due to an inadequate volume of blood received in culture bottles   Culture   Final    NO GROWTH 5 DAYS Performed at [facility], [address] [place], [place] [address]    Report Status 05/18/2023 FINAL   Final  Urine Culture (for pregnant, neutropenic or urologic patients or patients with an indwelling urinary catheter)     Status: Abnormal   Collection Time: 05/13/23 12:10 AM   Specimen: Urine, Clean Catch  Result Value Ref Range Status   Specimen Description   Final    URINE, CLEAN CATCH Performed at [facility], [address], [place], [place] [address]    Special Requests   Final    NONE Performed at [facility], [address], [place], [place] [address]    Culture >=100,000 COLONIES/mL KLEBSIELLA PNEUMONIAE (A)  Final   Report Status 05/15/2023 FINAL  Final   Organism ID, Bacteria KLEBSIELLA PNEUMONIAE (A)  Final      Susceptibility   Klebsiella pneumoniae - MIC*    AMPICILLIN RESISTANT Resistant     CEFAZOLIN <=4 SENSITIVE Sensitive     CEFEPIME <=0.12 SENSITIVE Sensitive     CEFTRIAXONE <=0.25 SENSITIVE Sensitive     CIPROFLOXACIN <=0.25 SENSITIVE Sensitive     GENTAMICIN <=1 SENSITIVE Sensitive     IMIPENEM <=0.25 SENSITIVE Sensitive     NITROFURANTOIN <=16 SENSITIVE Sensitive     TRIMETH/SULFA <=20 SENSITIVE Sensitive     AMPICILLIN/SULBACTAM 4 SENSITIVE Sensitive     PIP/TAZO <=4 SENSITIVE Sensitive ug/mL    * >=100,000 COLONIES/mL KLEBSIELLA PNEUMONIAE  MRSA Next Gen by PCR, Nasal     Status: Abnormal   Collection Time: 05/14/23  4:30 PM   Specimen: Nasal Mucosa; Nasal Swab  Result Value Ref Range Status   MRSA by PCR Next Gen DETECTED (A) NOT DETECTED Final    Comment: RESULT CALLED TO, READ BACK BY AND VERIFIED WITH: DORATHY MUHORO @1908  ON 05/14/23 SKL (NOTE) The GeneXpert MRSA Assay (FDA approved for NASAL specimens only), is one component of a comprehensive MRSA colonization surveillance program. It is not intended to diagnose MRSA infection nor to guide or monitor treatment for MRSA infections. Test performance is not FDA approved in patients less than 48 years old. Performed at [facility], [address]  [place], [place] [address]   Culture, blood (Routine X 2) w Reflex to ID Panel     Status: None (Preliminary result)   Collection Time: 05/22/23  3:04 PM   Specimen: BLOOD RIGHT ARM  Result Value Ref Range Status   Specimen Description BLOOD RIGHT ARM  Final   Special Requests   Final    BOTTLES DRAWN AEROBIC ONLY Blood Culture results may not be optimal due to an inadequate volume of blood received in culture bottles   Culture   Final    NO GROWTH 4 DAYS Performed at [facility], [address], [place], [place] [address]    Report Status PENDING  Incomplete  Culture, blood (Routine X 2) w Reflex to ID Panel     Status: None (Preliminary result)   Collection Time: 05/22/23  3:04 PM  Specimen: BLOOD RIGHT HAND  Result Value Ref Range Status   Specimen Description BLOOD RIGHT HAND  Final   Special Requests   Final    BOTTLES DRAWN AEROBIC ONLY Blood Culture results may not be optimal due to an inadequate volume of blood received in culture bottles   Culture   Final    NO GROWTH 4 DAYS Performed at [facility], [address] [place], [place] [address]    Report Status PENDING  Incomplete    Coagulation Studies: No results for input(s): "LABPROT", "INR" in the last 72 hours.   Urinalysis: No results for input(s): "COLORURINE", "LABSPEC", "PHURINE", "GLUCOSEU", "HGBUR", "BILIRUBINUR", "KETONESUR", "PROTEINUR", "UROBILINOGEN", "NITRITE", "LEUKOCYTESUR" in the last 72 hours.  Invalid input(s): "APPERANCEUR"     Imaging: No results found.    Medications:    feeding supplement (NEPRO CARB STEADY) 1,000 mL (05/25/23 0453)    ascorbic acid  250 mg Per Tube Daily   atorvastatin  20 mg Per Tube Daily   vitamin D3  2,000 Units Per Tube Daily   cholestyramine  4 g Per Tube TID   epoetin alfa-epbx (RETACRIT) injection  4,000 Units Intravenous Q T,Th,Sa-HD   escitalopram  10 mg Per Tube Daily   famotidine  10 mg Per Tube Daily   fludrocortisone  0.1 mg Per  Tube Daily   free water  30 mL Per Tube Q4H   heparin injection (subcutaneous)  5,000 Units Subcutaneous Q8H   hydrOXYzine  50 mg Per Tube TID   metoCLOPramide  5 mg Oral TID AC & HS   midodrine  15 mg Per Tube TID with meals   multivitamin  1 tablet Oral QHS   nutrition supplement (JUVEN)  1 packet Per Tube BID BM   nystatin   Topical TID   mouth rinse  15 mL Mouth Rinse 4 times per day   acetaminophen **OR** acetaminophen, chlorpheniramine-HYDROcodone, diphenhydrAMINE, ipratropium-albuterol, liver oil-zinc oxide, loperamide HCl, LORazepam, magnesium hydroxide, ondansetron **OR** ondansetron (ZOFRAN) IV, mouth rinse, traZODone  Assessment/ Plan:  [name] is a 73 y.o.  female with history of hypertension, coronary artery disease, congestive heart failure, COPD, cirrhosis, diabetes, status post left AKA, status post G-tube placement, and end stage renal disease on hemodialysis.  Patient presents to ED from dialysis at the request of family due to altered mental status.  Patient has been admitted for Lower urinary tract infectious disease [N39.0] Altered mental status, unspecified altered mental status type [R41.82] Pneumonia due to infectious organism, unspecified laterality, unspecified part of lung [J18.9] Sepsis due to gram-negative UTI (HCC) [A41.50, N39.0]  [facility]/TTs/Rt permcath/75.6kg  End-stage renal disease:  Receiving dialysis today, UF 1.5L as tolerated. Next treatment scheduled for Thursday  #2.  Sepsis, gram negative UTI. Received Lactated ringers in ED. Urine culture with Klebsiella.  Completed antibiotic regimen.  White count peaked at 30.1.  ID consulted  #3: Anemia of chronic kidney disease Lab Results  Component Value Date   HGB 9.7 (L) 05/26/2023  Hemoglobin acceptable Continue low dose EPO with dialysis.    #4: Secondary Hyperparathyroidism: with outpatient labs: PTH 35, phosphorus 7.6, calcium 9.4 on 04/21/23.   Lab Results  Component  Value Date   CALCIUM 9.4 05/26/2023   PHOS 6.6 (H) 05/26/2023    Calcium acceptable but phosphorus elevated. Will monitor for now. Can consider binders as outpatient.     LOS: 13 Kaiven Vester 2/11/20259:20 AM

## 2023-05-26 NOTE — Progress Notes (Signed)
Progress Note   Patient: Alexandra Foster ZOX:[id number] DOB: 22-Dec-1950 DOA: 05/13/2023     13 DOS: the patient was seen and examined on 05/26/2023   Brief hospital course: 73 year old female with end-stage renal disease on hemodialysis, osteoarthritis, asthma, COPD, type 2 diabetes mellitus, GERD, hypertension and paroxysmal atrial fibrillation and obstructive sleep apnea on CPAP presented to the ER with confusion over the last 3 days with intermittent vomiting.  She was admitted with clinical sepsis secondary to right upper lobe pneumonia likely aspiration and urinary infection.  Started on antibiotic.  1/29.  some cough and shortness of breath.  Restarted tube feeds.  Fever of 103. 2/1.  Rapid response at dialysis.  Patient had large brown BM then was less responsive and has shortness of breath.  Bipap ordered.  ABG.  Iv albumin.  Spoke with nephrology to remove fluid.  CT scan showing worsening pneumonia.  Antibiotics switched to Zosyn and vancomycin 2/2.  Respiratory status much improved.  Off BiPAP on 3 L of oxygen.  BP lower side increased midodrine to 15 mg 3 times daily. 2/3. On 1 L O2.  Steroids discontinued. 2/4.  White blood cell count 30.1.  Diarrhea not documented.  I assumed care 05/20/23 WBC improved to 18.8k Pt remained on IV Zosyn through 2/6, stopped having completed total of 10 days antibiotic coverage. When tried off antibiotics, WBC again increased. ID was consulted and PO Zyvox was started for additional MRSA coverage pending repeat cultures and attempt to identify any infection source.  Blood cultures negative at day 4.  WBC is staying in low 20's but pt asymptomatic.    Getting hematology's input today and anticipate discharge home tomorrow.    Assessment and Plan: * Severe sepsis (HCC) Present on admission with leukocytosis fever tachycardia and tachypnea and acute respiratory failure.  Patient has right upper lobe pneumonia and Klebsiella growing out of urine  culture.  WBC climbed to peak 30.1 (note pt was also on steroids). Antibiotics switched on 2/1 to Zosyn.  Monitored off after 10 days completed (2/6 >> 2/7).  Off steroids since 2/3. WBC increased when tried off antibiotics. ID consulted for recommendations Resumed on Linezolid 2/8 >> 2/11 Repeat blood cultures negative x 4days. ??Skin source - wounds appear healthy   No diarrhea. Monitor fever curve, CBC and clinically for s/sx's of infection --STOP antibiotics --Monitor CBC with diff --Hematology consulted for their input which is appreciated. --Weekly CBC's ordered for [name] RN to draw  Suspect drug reaction, given eosinophilia and skin peeling noted after vancomycin.  Acute hypoxic respiratory failure (HCC) With respiratory distress on 2/1 was placed on BiPAP.  Last pulse ox on room air.  This problem has resolved.  Hypotension On midodrine and florinef for chronically low blood pressure.  Increased midodrine to 15 mg 3 times daily.  Aspiration pneumonia (HCC) Multifocal pneumonia seen on CT scan.  Stop Zosyn. Completed total of 10 days antibiotics See Severe Sepsis   Acute metabolic encephalopathy Mental status improved.  ESRD on hemodialysis [place]) Dialysis per Nephrology  Dyslipidemia On Lipitor  Acute on chronic diastolic CHF (congestive heart failure) (HCC) Likely more worsening pneumonia rather than heart failure.  Dialysis to manage fluid.  Hyperkalemia Improved with dialysis  Urinary tract infection due to Klebsiella species Treated with antibiotics  Pneumonia due to infectious organism See aspiration pneumonia  Itching On atarax standing dose and as needed Benadryl  Stage II decubitus ulcer (HCC) Buttocks, see full description below, documented on 1/30.  Diarrhea As  needed Imodium and standing dose cholestyramine.  Obesity (BMI 30-39.9) BMI down to 29.71  Anxiety and depression On Klonopin and Lexapro.        Subjective: Pt seen in  dialysis, asking to have her back rubbed.  No other complaints or issues reported.  No diarrhea per nursing.   Physical Exam: Vitals:   05/26/23 1030 05/26/23 1100 05/26/23 1102 05/26/23 1153  BP: (!) 104/59  (!) 117/53 101/63  Pulse: 68 72 72 71  Resp: 19 (!) 26 (!) 25 18  Temp:   98.6 F (37 C) 98.7 F (37.1 C)  TempSrc:   Oral Oral  SpO2: 100% 100% 100% 97%  Weight:   78.2 kg   Height:       General exam: awake, alert, no acute distress, chronically ill appearing HEENT: moist mucus membranes, hearing grossly normal  Respiratory system: lungs clear but diminished bases due to poor inspiratory volumes, no wheezes, normal respiratory effort on 3 L/min [name]. Cardiovascular system: normal S1/S2, RRR, perm cath right upper chest accessed for dialysis Gastrointestinal system: soft, NT, ND Central nervous system: A&O x 2+. no gross focal neurologic deficits, normal speech Skin: dry flaky skin, normal temperature Psychiatry: normal mood, flat affect      Data Reviewed:   Notable labs  --  Na 130 BUN 81 Cr 4.35 Phos 6.6 Albumin 2.4  WBC trending 30.1 >> 18.8 >> 17.9 >>21.3 (resumed abx) >> 19.5 >> 22.3 >> 21.6 (stop abx - cultures negative at day 4).    Hbg stable 10.2 >> 10.2 >> 9.6 >> 9.9 >> 10.4 >> 9.7  Platelets 453 >> 552 >> 623 >> 653 suspect reactive     Family Communication: Daughter Alexandra Foster updated by phone today.  Discussed anticipated medical readiness for discharge later today vs tomorrow, pending input from hematology and ID.  Alexandra Foster stated she will be prepared for pt to return home tomorrow.    Disposition: Status is: Inpatient Remains inpatient appropriate because: monitoring leukocytosis and pending hematology recommendation.  Likely discharge home with [facility] tomorrow 2/12.    Planned Discharge Destination: Home with Home Health    Time spent: 40 minutes  Author: [name], DO 05/26/2023 12:53 PM  For on call review [email or web address].

## 2023-05-26 NOTE — Progress Notes (Signed)
Hemodialysis Note:  Received patient in bed to unit. Alert and oriented. Informed consent singed and in chart.  Treatment initiated: 0819 Treatment completed: 1102 Patient requested to end her treatment 54 minutes early HD NP notified  Access used: Right internal jugular catheter Access issues: None  Patient tolerated well. Transported back to room, alert without acute distress. Report given to patient's RN.  Total UF removed: 0.9 Liter Medications given: Retacrit 4000 units IV, Zofran 4mg  IV  Post HD weight: 78.2 Kg  [facility]

## 2023-05-26 NOTE — Plan of Care (Signed)

## 2023-05-26 NOTE — Consult Note (Signed)
Hematology/Oncology Consult note [phone] [phone]      Patient Care Team: [name], MD as PCP - General (Family Medicine) Debbe Odea, MD as PCP - Cardiology (Cardiology) Huston Foley, MD as Consulting Physician (Neurology) Bud Face, MD as Referring Physician (Otolaryngology) Andee Poles, [facility] (Inactive) (Pharmacist) Rodney Langton, RN as [facility] Care Management   Name of the patient: Alexandra Foster  [id number]  [birth year]   REASON FOR COSULTATION:  Persistent leukocytosis History of presenting illness-  73 y.o. female with PMH listed at below who is currently admitted due to increased confusion, pneumonia as well as UTI. At presentation, patient had a white count 5.6 on 05/13/2023.  White count increased to 27.3 on 05/14/2023.  Peaked on 05/19/2023 with levels of 30.1.  Patient also received Solu-Medrol 40 mg daily from 05/16/2023 - 05/18/2023.  Then stopped..  Leukocytosis is predominantly neutrophilia as well as monocytosis and eosinophilia.  For treatment of aspiration pneumonia/Klebsiella UTI, patient was treated with multiple antibiotics, including broad-spectrum antibiotics with vancomycin... Total white count trended down to 17.9 on 05/21/2023, and then started to trend up again and reached 23.3 on 05/25/2023. She has eosinophilia which peaked on 05/16/2023 at the level of 9.4 and trended down.   Patient has a central line hemodialysis catheter as well as a chronic decub ulcer. During this admission, he has developed scaly peeling skin, which was thought to be due to an allergic reaction to vancomycin. Patient is clinically improving however leukocytosis is persistent. Hematology was consulted for leukocytosis.  Patient is a very poor historian.  Most of the medical history was obtained from chart review and discussed with care team.  She denies any pain, nausea vomiting diarrhea.  Allergies  Allergen Reactions   Augmentin  [Amoxicillin-Pot Clavulanate] Dermatitis   Chlorhexidine Dermatitis   Vancomycin Rash    Skin peeling    Zosyn [Piperacillin-Tazobactam In Dex] Rash    Patient Active Problem List   Diagnosis Date Noted   Altered mental status 05/24/2023   Eosinophilia 05/23/2023   Acute on chronic diastolic CHF (congestive heart failure) (HCC) 05/16/2023   Urinary tract infection due to Klebsiella species 05/15/2023   Hyperkalemia 05/15/2023   Dyslipidemia 05/13/2023   Hypotension 05/13/2023   Itching 05/13/2023   Pneumonia due to infectious organism 05/13/2023   Dyspnea 02/28/2023   Dyspnea on exertion 02/27/2023   Hx of AKA (above knee amputation), left (HCC) 02/27/2023   Truncal obesity 02/27/2023   Gastrostomy tube in place (HCC) 02/15/2023   Stage II decubitus ulcer (HCC) 02/15/2023   Septic shock (HCC) 02/14/2023   Aspiration pneumonia (HCC) 02/13/2023   Adrenal insufficiency (HCC) 02/13/2023   Malnutrition (HCC) 02/11/2023   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   Cholelithiasis without cholecystitis 02/11/2023   Bowel and bladder incontinence 02/11/2023   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   Diarrhea 12/28/2022   Dysphagia 12/26/2022   Anemia of chronic disease 12/24/2022   Obesity (BMI 30-39.9) 12/23/2022   Acute metabolic encephalopathy 12/06/2022   Anxiety and depression 12/06/2022   Type 2 diabetes mellitus with chronic kidney disease, with long-term current use of insulin (HCC) 12/06/2022   ESRD on hemodialysis (HCC) 12/06/2022   Subdural hemorrhage (HCC) 05/18/2021   Acute hypoxic respiratory failure (HCC) 05/12/2021   Atrial fibrillation and flutter (HCC)    Severe sepsis (HCC) 05/04/2021   Personal history of COVID-19 07/14/2019   Osteoarthritis of both knees 01/14/2018   Urge incontinence 03/14/2016   Osteopenia 02/13/2016  OSA (obstructive sleep apnea) 01/08/2016   Allergic rhinitis 04/13/2015   COPD (chronic obstructive  pulmonary disease) (HCC) 09/18/2014   Esophagitis, reflux 09/18/2014     Past Medical History:  Diagnosis Date   Adrenal insufficiency (HCC) 02/13/2023   AKI (acute kidney injury) (HCC)    a. 04/2021 in setting of bacteremia/shock.   Arthritis    Asthma    Bacteremia    a. 04/2021 S pyogenes bacteremia in setting of lower ext cellulitis.   COPD (chronic obstructive pulmonary disease) (HCC)    Diabetes mellitus without complication (HCC)    Endometriosis    GERD (gastroesophageal reflux disease)    History of echocardiogram    a. 07/2013 Echo: EF 55-60%, impaired relaxation, mild TR; b. 04/2021 Echo: EF 50-55%, mild LVH, nl RV fxn, mild BAE, Ao sclerosis w/o stenosis.   Hypertension    Obesity    PAF (paroxysmal atrial fibrillation) (HCC)    a. 04/2021 in setting of septic shock/cellulitis.   Sleep apnea    CPAP     Past Surgical History:  Procedure Laterality Date   ABDOMINAL HYSTERECTOMY     APPLICATION OF WOUND VAC Left 05/17/2021   Procedure: APPLICATION OF WOUND VAC/WOUND VAC EXCHANGE-Matrix Myriad;  Surgeon: Carolan Shiver, MD;  Location: [facility];  Service: General;  Laterality: Left;   APPLICATION OF WOUND VAC  05/24/2021   Procedure: APPLICATION OF WOUND VAC;  Surgeon: Carolan Shiver, MD;  Location: [facility];  Service: General;;   APPLICATION OF WOUND VAC  05/10/2021   Procedure: APPLICATION OF WOUND VAC;  Surgeon: Carolan Shiver, MD;  Location: [facility];  Service: General;;   COLONOSCOPY  10/29/2006   Dr Servando Snare   COLONOSCOPY WITH PROPOFOL N/A 11/05/2016   Procedure: COLONOSCOPY WITH PROPOFOL;  Surgeon: Earline Mayotte, MD;  Location: [facility];  Service: Endoscopy;  Laterality: N/A;   DIALYSIS/PERMA CATHETER INSERTION N/A 12/19/2022   Procedure: DIALYSIS/PERMA CATHETER INSERTION;  Surgeon: Annice Needy, MD;  Location: [facility];  Service: Cardiovascular;  Laterality: N/A;   DIALYSIS/PERMA CATHETER INSERTION N/A 02/04/2023   Procedure:  DIALYSIS/PERMA CATHETER INSERTION;  Surgeon: Annice Needy, MD;  Location: [facility];  Service: Cardiovascular;  Laterality: N/A;   DIALYSIS/PERMA CATHETER INSERTION N/A 02/16/2023   Procedure: DIALYSIS/PERMA CATHETER INSERTION;  Surgeon: Annice Needy, MD;  Location: [facility];  Service: Cardiovascular;  Laterality: N/A;   DIALYSIS/PERMA CATHETER INSERTION N/A 03/23/2023   Procedure: DIALYSIS/PERMA CATHETER INSERTION;  Surgeon: Annice Needy, MD;  Location: [facility];  Service: Cardiovascular;  Laterality: N/A;   INCISION AND DRAINAGE OF WOUND Left 05/24/2021   Procedure: IRRIGATION AND DEBRIDEMENT LEFT LEG;  Surgeon: Carolan Shiver, MD;  Location: [facility];  Service: General;  Laterality: Left;   INCISION AND DRAINAGE OF WOUND Left 05/10/2021   Procedure: IRRIGATION AND DEBRIDEMENT LEFT LEG;  Surgeon: Carolan Shiver, MD;  Location: [facility];  Service: General;  Laterality: Left;   IR FLUORO GUIDE CV LINE RIGHT  06/12/2021   IR PERC TUN PERIT CATH WO PORT S&I /IMAG  06/12/2021   IR RADIOLOGIST EVAL & MGMT  03/04/2023   IR REPLC GASTRO/COLONIC TUBE PERCUT W/FLUORO  06/12/2021   IVC FILTER INSERTION N/A 05/29/2021   Procedure: IVC FILTER INSERTION;  Surgeon: Renford Dills, MD;  Location: [facility];  Service: Cardiovascular;  Laterality: N/A;   MINOR GRAFT APPLICATION  05/24/2021   Procedure: Myriad Matrix  APPLICATION;  Surgeon: Carolan Shiver, MD;  Location: [facility];  Service: General;;   NASAL SINUS SURGERY  2002   Dr Chestine Spore   PEG PLACEMENT N/A 05/28/2021   Procedure: PERCUTANEOUS ENDOSCOPIC GASTROSTOMY (PEG) PLACEMENT;  Surgeon: Sung Amabile, DO;  Location: [facility];  Service: General;  Laterality: N/A;  TRAVEL CASE   TRACHEOSTOMY TUBE PLACEMENT N/A 05/17/2021   Procedure: TRACHEOSTOMY;  Surgeon: Geanie Logan, MD;  Location: [facility];  Service: ENT;  Laterality: N/A;   WOUND DEBRIDEMENT Left 05/07/2021   Procedure: DEBRIDEMENT WOUND;   Surgeon: Carolan Shiver, MD;  Location: [facility];  Service: General;  Laterality: Left;    Social History   Socioeconomic History   Marital status: Married    Spouse name: [name]   Number of children: 1   Years of education: 12   Highest education level: 12th grade  Occupational History    Employer: RETIRED  Tobacco Use   Smoking status: Former    Current packs/day: 0.00    Average packs/day: 2.0 packs/day for 40.0 years (80.0 ttl pk-yrs)    Types: Cigarettes    Start date: 1963    Quit date: 2003    Years since quitting: 22.1   Smokeless tobacco: Former    Types: Snuff    Quit date: 04/2001   Tobacco comments:    smoking cessation materials not required  Vaping Use   Vaping status: Never Used  Substance and Sexual Activity   Alcohol use: No    Alcohol/week: 0.0 standard drinks of alcohol   Drug use: No   Sexual activity: Not Currently  Other Topics Concern   Not on file  Social History Narrative   Not on file   Social Drivers of Health   Financial Resource Strain: Medium Risk (08/09/2020)   Overall Financial Resource Strain (CARDIA)    Difficulty of Paying Living Expenses: Somewhat hard  Food Insecurity: No Food Insecurity (05/14/2023)   Hunger Vital Sign    Worried About Running Out of Food in the Last Year: Never true    Ran Out of Food in the Last Year: Never true  Transportation Needs: No Transportation Needs (05/14/2023)   PRAPARE - Administrator, Civil Service (Medical): No    Lack of Transportation (Non-Medical): No  Recent Concern: Transportation Needs - Unmet Transportation Needs (03/10/2023)   PRAPARE - Transportation    Lack of Transportation (Medical): Yes    Lack of Transportation (Non-Medical): Yes  Physical Activity: Inactive (08/09/2020)   Exercise Vital Sign    Days of Exercise per Week: 0 days    Minutes of Exercise per Session: 0 min  Stress: No Stress Concern Present (08/09/2020)   [employer] of Occupational Health  - Occupational Stress Questionnaire    Feeling of Stress : Not at all  Social Connections: Moderately Integrated (05/14/2023)   Social Connection and Isolation Panel [NHANES]    Frequency of Communication with Friends and Family: More than three times a week    Frequency of Social Gatherings with Friends and Family: Once a week    Attends Religious Services: 1 to 4 times per year    Active Member of [employer] or Organizations: No    Attends Banker Meetings: Never    Marital Status: Married  Catering manager Violence: Not At Risk (05/14/2023)   Humiliation, Afraid, Rape, and Kick questionnaire    Fear of Current or Ex-Partner: No    Emotionally Abused: No    Physically Abused: No    Sexually Abused: No  Family History  Problem Relation Age of Onset   Congestive Heart Failure Mother    Coronary artery disease Father 62   Breast cancer Sister 42   Heart disease Brother    Varicose Veins Brother    Alcohol abuse Brother      Current Facility-Administered Medications:    acetaminophen (TYLENOL) tablet 650 mg, 650 mg, Oral, Q6H PRN, 650 mg at 05/23/23 2252 **OR** acetaminophen (TYLENOL) suppository 650 mg, 650 mg, Rectal, Q6H PRN, Meegan, Eryn, RPH   ascorbic acid (VITAMIN C) tablet 250 mg, 250 mg, Per Tube, Daily, Esaw Grandchild A, DO, 250 mg at 05/25/23 1104   atorvastatin (LIPITOR) tablet 20 mg, 20 mg, Per Tube, Daily, Esaw Grandchild A, DO, 20 mg at 05/25/23 1106   chlorpheniramine-HYDROcodone (TUSSIONEX) 10-8 MG/5ML suspension 5 mL, 5 mL, Per Tube, Q12H PRN, Esaw Grandchild A, DO   cholecalciferol (VITAMIN D3) 25 MCG (1000 UNIT) tablet 2,000 Units, 2,000 Units, Per Tube, Daily, Esaw Grandchild A, DO, 2,000 Units at 05/25/23 1102   cholestyramine (QUESTRAN) packet 4 g, 4 g, Per Tube, TID, Esaw Grandchild A, DO, 4 g at 05/25/23 2200   diphenhydrAMINE (BENADRYL) 12.5 MG/5ML elixir 12.5 mg, 12.5 mg, Per Tube, Q8H PRN, Esaw Grandchild A, DO   epoetin alfa-epbx (RETACRIT)  injection 4,000 Units, 4,000 Units, Intravenous, Q T,Th,Sa-HD, Breeze, Gery Pray, NP, 4,000 Units at 05/23/23 1223   escitalopram (LEXAPRO) tablet 10 mg, 10 mg, Per Tube, Daily, Esaw Grandchild A, DO, 10 mg at 05/25/23 1104   famotidine (PEPCID) tablet 10 mg, 10 mg, Per Tube, Daily, Esaw Grandchild A, DO, 10 mg at 05/25/23 1105   feeding supplement (NEPRO CARB STEADY) liquid 1,000 mL, 1,000 mL, Per Tube, Continuous, Manuela Schwartz, NP, Last Rate: 45 mL/hr at 05/25/23 0453, 1,000 mL at 05/25/23 0453   fludrocortisone (FLORINEF) tablet 0.1 mg, 0.1 mg, Per Tube, Daily, Esaw Grandchild A, DO, 0.1 mg at 05/25/23 1102   free water 30 mL, 30 mL, Per Tube, Q4H, Wieting, Richard, MD, 30 mL at 05/26/23 0449   heparin injection 5,000 Units, 5,000 Units, Subcutaneous, Q8H, Mansy, Jan A, MD, 5,000 Units at 05/26/23 0549   hydrOXYzine (ATARAX) tablet 50 mg, 50 mg, Per Tube, TID, Esaw Grandchild A, DO, 50 mg at 05/26/23 0703   ipratropium-albuterol (DUONEB) 0.5-2.5 (3) MG/3ML nebulizer solution 3 mL, 3 mL, Nebulization, Q4H PRN, Alford Highland, MD   liver oil-zinc oxide (DESITIN) 40 % ointment, , Topical, TID PRN, Mansy, Jan A, MD   loperamide HCl (IMODIUM) 1 MG/7.5ML suspension 4 mg, 4 mg, Per Tube, Q6H PRN, Esaw Grandchild A, DO, 4 mg at 05/25/23 1106   LORazepam (ATIVAN) tablet 0.5 mg, 0.5 mg, Per Tube, Q4H PRN, Esaw Grandchild A, DO   magnesium hydroxide (MILK OF MAGNESIA) suspension 30 mL, 30 mL, Per Tube, Daily PRN, Esaw Grandchild A, DO   metoCLOPramide (REGLAN) tablet 5 mg, 5 mg, Oral, TID AC & HS, Meegan, Eryn, RPH, 5 mg at 05/25/23 2200   midodrine (PROAMATINE) tablet 15 mg, 15 mg, Per Tube, TID with meals, Esaw Grandchild A, DO, 15 mg at 05/26/23 0703   multivitamin (RENA-VIT) tablet 1 tablet, 1 tablet, Oral, QHS, Madueme, Elvira C, RPH, 1 tablet at 05/25/23 2201   nutrition supplement (JUVEN) (JUVEN) powder packet 1 packet, 1 packet, Per Tube, BID BM, Esaw Grandchild A, DO, 1 packet at 05/25/23  1335   nystatin (MYCOSTATIN/NYSTOP) topical powder, , Topical, TID, Mansy, Vernetta Honey, MD, Given at 05/25/23 2159   ondansetron [facility]) tablet 4  mg, 4 mg, Per Tube, Q6H PRN **OR** ondansetron (ZOFRAN) injection 4 mg, 4 mg, Intravenous, Q6H PRN, Esaw Grandchild A, DO   Oral care mouth rinse, 15 mL, Mouth Rinse, 4 times per day, Manuela Schwartz, NP, 15 mL at 05/25/23 2201   Oral care mouth rinse, 15 mL, Mouth Rinse, PRN, Manuela Schwartz, NP   traZODone (DESYREL) tablet 25 mg, 25 mg, Per Tube, QHS PRN, Esaw Grandchild A, DO  Facility-Administered Medications Ordered in Other Encounters:    lidocaine-EPINEPHrine (PF) (XYLOCAINE-EPINEPHrine) 1 %-1:200000 (PF) injection, , , PRN, Annice Needy, MD, 20 mL at 03/23/23 1607  Review of Systems  Unable to perform ROS: Mental status change  Respiratory:  Negative for chest tightness, cough and shortness of breath.   Gastrointestinal:  Negative for abdominal pain.    PHYSICAL EXAM Vitals:   05/26/23 0500 05/26/23 0548 05/26/23 0800 05/26/23 0819  BP:  (!) 113/56 (!) 107/59   Pulse:  64 63 (!) 58  Resp:  17 17 14   Temp:  99 F (37.2 C) 98.3 F (36.8 C)   TempSrc:  Oral Axillary   SpO2:  100% 99% 100%  Weight: 179 lb 10.8 oz (81.5 kg)  174 lb 6.1 oz (79.1 kg)   Height:       Physical Exam Constitutional:      General: She is not in acute distress.    Appearance: She is ill-appearing. She is not diaphoretic.  HENT:     Head: Normocephalic and atraumatic.  Eyes:     General: No scleral icterus. Cardiovascular:     Rate and Rhythm: Normal rate and regular rhythm.  Pulmonary:     Effort: Pulmonary effort is normal. No respiratory distress.  Abdominal:     General: Bowel sounds are normal. There is no distension.     Palpations: Abdomen is soft. There is no mass.  Musculoskeletal:     Cervical back: Normal range of motion and neck supple.     Comments: Left AKA  Skin:    Findings: No erythema.     Comments: Right anterior chest wall  hemodialysis catheter, no erythematous changes  Neurological:     Mental Status: She is alert. Mental status is at baseline.     Motor: No abnormal muscle tone.  Psychiatric:        Mood and Affect: Affect normal.       LABORATORY STUDIES    Latest Ref Rng & Units 05/26/2023    5:26 AM 05/25/2023    9:41 AM 05/24/2023    4:12 AM  CBC  WBC 4.0 - 10.5 K/uL 21.6  22.3  19.5   Hemoglobin 12.0 - 15.0 g/dL 9.7  16.1  9.9   Hematocrit 36.0 - 46.0 % 31.0  32.6  31.0   Platelets 150 - 400 K/uL 653  623  552       Latest Ref Rng & Units 05/26/2023    8:11 AM 05/25/2023    9:41 AM 05/23/2023    4:02 AM  CMP  Glucose 70 - 99 mg/dL 88  096  045   BUN 8 - 23 mg/dL 81  57  19   Creatinine 0.44 - 1.00 mg/dL 4.09  8.11  9.14   Sodium 135 - 145 mmol/L 130  133  136   Potassium 3.5 - 5.1 mmol/L 4.1  4.0  3.3   Chloride 98 - 111 mmol/L 98  99  100   CO2 22 - 32 mmol/L 22  23  26   Calcium 8.9 - 10.3 mg/dL 9.4  9.6  8.6      RADIOGRAPHIC STUDIES: I have personally reviewed the radiological images as listed and agreed with the findings in the report. DG Chest Port 1 View Result Date: 05/22/2023 CLINICAL DATA:  Altered mental status and leukocytosis. EXAM: PORTABLE CHEST 1 VIEW COMPARISON:  05/16/2023 FINDINGS: Stable heart size and appearance of tunneled dialysis catheter. Airspace disease of the right lung appears improved with some mild patchy asymmetric opacity remaining compared to the left lung. This could represent asymmetric edema of versus pneumonia. No pleural fluid or pneumothorax identified. The visualized skeletal structures are unremarkable. IMPRESSION: Improved airspace disease of the right lung with some mild patchy asymmetric opacity remaining compared to the left lung. This could represent asymmetric edema versus pneumonia. Electronically Signed   By: Irish Lack M.D.   On: 05/22/2023 13:37   CT Angio Chest Pulmonary Embolism (PE) W or WO Contrast Result Date: 05/16/2023 CLINICAL  DATA:  Shortness of breath. EXAM: CT ANGIOGRAPHY CHEST WITH CONTRAST TECHNIQUE: Multidetector CT imaging of the chest was performed using the standard protocol during bolus administration of intravenous contrast. Multiplanar CT image reconstructions and MIPs were obtained to evaluate the vascular anatomy. RADIATION DOSE REDUCTION: This exam was performed according to the departmental dose-optimization program which includes automated exposure control, adjustment of the mA and/or kV according to patient size and/or use of iterative reconstruction technique. CONTRAST:  75mL OMNIPAQUE IOHEXOL 350 MG/ML SOLN COMPARISON:  February 27, 2023. FINDINGS: Cardiovascular: Satisfactory opacification of the pulmonary arteries to the segmental level. No evidence of pulmonary embolism. Normal heart size. No pericardial effusion. Mediastinum/Nodes: No enlarged mediastinal, hilar, or axillary lymph nodes. Thyroid gland, trachea, and esophagus demonstrate no significant findings. Lungs/Pleura: No pneumothorax or pleural effusion is noted. Significantly increased patchy airspace opacities are noted throughout the right lung and to a lesser degree in the left lung most consistent with worsening multifocal pneumonia. Upper Abdomen: Hepatic cirrhosis. Musculoskeletal: No chest wall abnormality. No acute or significant osseous findings. Review of the MIP images confirms the above findings. IMPRESSION: No definite evidence of pulmonary embolus. Significant increased patchy airspace opacities are noted throughout both lungs, right much greater than left, most consistent with worsening multifocal pneumonia. Hepatic cirrhosis. Electronically Signed   By: Lupita Raider M.D.   On: 05/16/2023 17:08   DG Chest Port 1 View Result Date: 05/16/2023 CLINICAL DATA:  Shortness of breath during dialysis EXAM: PORTABLE CHEST 1 VIEW COMPARISON:  05/12/2023 FINDINGS: Right IJ  dialysis catheter tip at low SVC. Midline trachea. Normal heart size for  level of inspiration. Mild right hemidiaphragm elevation. No pleural effusion or pneumothorax. Diffuse interstitial prominence. More confluent right mid and upper lung airspace disease is slightly more distinct today. Mild left base subsegmental atelectasis is unchanged. Numerous leads and wires project over the chest. IMPRESSION: Right mid and upper lung airspace disease is slightly more distinct today, favoring pneumonia over asymmetric pulmonary edema. Electronically Signed   By: Jeronimo Greaves M.D.   On: 05/16/2023 12:14   DG ABDOMEN PEG TUBE LOCATION Result Date: 05/13/2023 CLINICAL DATA:  G-tube check EXAM: ABDOMEN - 1 VIEW COMPARISON:  CT 05/13/2023 FINDINGS: Technologist notes indicate 30 cc Gastrografin administered through the percutaneous gastrostomy tube. There is contrast throughout the stomach. Gastrostomy tube in the distal stomach. IMPRESSION: 1. Percutaneous gastrostomy tube appears in proper location on AP view of the abdomen. 2. Injected contrast outlines the stomach. Electronically Signed   By: Roseanne Reno  Amil Amen M.D.   On: 05/13/2023 21:42   CT ANGIO UP EXTREM LEFT W &/OR WO CONTAST Result Date: 05/13/2023 CLINICAL DATA:  Decreased blood pressure in left arm compared to the right EXAM: CT ANGIOGRAPHY OF THE LEFT UPPEREXTREMITY TECHNIQUE: Multidetector CT imaging of the left upper extremitywas performed using the standard protocol during bolus administration of intravenous contrast. Multiplanar CT image reconstructions and MIPs were obtained to evaluate the vascular anatomy. RADIATION DOSE REDUCTION: This exam was performed according to the departmental dose-optimization program which includes automated exposure control, adjustment of the mA and/or kV according to patient size and/or use of iterative reconstruction technique. CONTRAST:  OMNIPAQUE IOHEXOL 350 MG/ML SOLN COMPARISON:  None Available. FINDINGS: Vascular: Thoracic aorta shows no aneurysmal dilatation or dissection. No cardiac  enlargement is seen. The origins of the right innominate, left common carotid and left subclavian arteries are all widely patent. The left subclavian artery and left axillary artery are widely patent. Considerable artifact from the injected contrast bolus in the left arm is noted limiting the exam. The brachial artery appears patent although very obscured by the contrast bolus within the venous structures. The distal branches below the elbow are poorly visualized due to the timing of the contrast bolus. Pulmonary artery as visualized is within normal limits no pulmonary embolus is seen. The abdominal aorta is within normal limits. IVC filter is noted in satisfactory position. Dialysis catheter is noted in the right jugular vein. Nonvascular: Thoracic inlet is within normal limits. Scattered nodular changes are noted in the breasts bilaterally of uncertain significance. Correlation with prior mammography would be helpful. These were not present on prior CT in November of 2024. No hilar or mediastinal adenopathy is noted. The lungs demonstrate some patchy atelectatic changes bilaterally. Diffuse parenchymal densities are seen particularly in the right lung. This may represent some volume overload and edema. Possibility of underlying inflammatory change could not be totally excluded. Included structures in the abdomen show evidence of gastrostomy catheter in satisfactory position. Scattered diverticular change of the colon is noted without diverticulitis. Bony structures show degenerative change of the thoracolumbar spine. Review of the MIP images confirms the above findings. IMPRESSION: No findings to suggest left subclavian or axillary arterial stenosis are seen. The more distal arterial structures to include the brachial, radial and ulnar arteries are poorly visualized in part due to injection of contrast in the same arm as well as timing of the contrast bolus. Changes in lungs particularly on the right which may  represent edema although the possibility of underlying inflammatory change deserves consideration. Subcutaneous nodular changes in the breasts bilaterally not seen on prior exam in November of 2024. The need for further follow-up can be determined on a clinical basis. Electronically Signed   By: Alcide Clever M.D.   On: 05/13/2023 20:07   DG Chest 1 View Result Date: 05/12/2023 CLINICAL DATA:  Shortness of breath EXAM: CHEST  1 VIEW COMPARISON:  03/06/2023 FINDINGS: The patient is rotated to the right on today's radiograph, reducing diagnostic sensitivity and specificity. Dialysis catheter tip: Lower SVC. Hazy opacity projects over the right upper lobe. Some of this is rotational and cause by soft tissues of the chest wall and asymmetric inclusion of the scapula, but accounting for this possibility of right upper lobe bronchopneumonia or asymmetric edema is not excluded. Left lung appears clear. Thoracic spondylosis. IMPRESSION: 1. Hazy opacity projects over the right upper lobe. Some of this is rotational and cause by soft tissues of the chest wall and  asymmetric inclusion of the scapula, but accounting for this possibility of right upper lobe bronchopneumonia or asymmetric edema is not excluded. 2. Dialysis catheter tip: Lower SVC. Electronically Signed   By: Gaylyn Rong M.D.   On: 05/12/2023 17:29   PERIPHERAL VASCULAR CATHETERIZATION Result Date: 03/23/2023 See surgical note for result.  US Venous Img Upper Uni Left (DVT) Result Date: 03/07/2023 CLINICAL DATA:  Left upper extremity edema.  Evaluate for DVT. EXAM: LEFT UPPER EXTREMITY VENOUS DOPPLER ULTRASOUND TECHNIQUE: Gray-scale sonography with graded compression, as well as color Doppler and duplex ultrasound were performed to evaluate the upper extremity deep venous system from the level of the subclavian vein and including the jugular, axillary, basilic, radial, ulnar and upper cephalic vein. Spectral Doppler was utilized to evaluate flow at  rest and with distal augmentation maneuvers. COMPARISON:  None Available. FINDINGS: Contralateral Subclavian Vein: Respiratory phasicity is normal and symmetric with the symptomatic side. No evidence of thrombus. Normal compressibility. Internal Jugular Vein: Not visualized due to bandages from an IV. Subclavian Vein: No evidence of thrombus. Normal compressibility, respiratory phasicity and response to augmentation. Axillary Vein: No evidence of thrombus. Normal compressibility, respiratory phasicity and response to augmentation. Cephalic Vein: There is hypoechoic occlusive thrombus involving the cephalic vein at the level of the distal arm (image 11 and 31). The cephalic vein appears patent proximally and at its mid aspect Basilic Vein: No evidence of thrombus. Normal compressibility, respiratory phasicity and response to augmentation. Brachial Veins: No evidence of thrombus. Normal compressibility, respiratory phasicity and response to augmentation. Radial Veins: No evidence of thrombus. Normal compressibility, respiratory phasicity and response to augmentation. Ulnar Veins: No evidence of thrombus. Normal compressibility, respiratory phasicity and response to augmentation. Other Findings:  None visualized. IMPRESSION: 1. No evidence of DVT within the left upper extremity. 2. Examination is positive for short-segment occlusive superficial thrombophlebitis involving the distal aspect of the cephalic vein. Electronically Signed   By: Simonne Come M.D.   On: 03/07/2023 17:39   DG Chest Port 1 View Result Date: 03/06/2023 CLINICAL DATA:  Dyspnea. EXAM: PORTABLE CHEST 1 VIEW COMPARISON:  Chest x-ray dated March 01, 2023. FINDINGS: The patient is rotated to the right, limiting evaluation. Unchanged tunneled right internal jugular dialysis catheter and left internal jugular central venous catheter. Stable cardiomediastinal silhouette with normal heart size. Unchanged mild bibasilar atelectasis. No focal  consolidation, pleural effusion, or pneumothorax. No acute osseous abnormality. IMPRESSION: 1. Unchanged mild bibasilar atelectasis. Electronically Signed   By: Obie Dredge M.D.   On: 03/06/2023 18:21   IR Radiologist Eval & Mgmt Result Date: 03/04/2023 EXAM: NEW PATIENT OFFICE VISIT CHIEF COMPLAINT: See epic note HISTORY OF PRESENT ILLNESS: See epic note REVIEW OF SYSTEMS: See epic note PHYSICAL EXAMINATION: See epic note ASSESSMENT AND PLAN: See epic note Electronically Signed   By: Olive Bass M.D.   On: 03/04/2023 15:15   US Abdomen Limited RUQ (LIVER/GB) Result Date: 03/01/2023 CLINICAL DATA:  Elevated liver function tests EXAM: ULTRASOUND ABDOMEN LIMITED RIGHT UPPER QUADRANT COMPARISON:  02/27/2023 FINDINGS: Gallbladder: Shadowing gallstone layers dependently within the gallbladder, measuring 19 mm. No gallbladder wall thickening or pericholecystic fluid. Negative sonographic Murphy sign. Common bile duct: Diameter: 3 mm Liver: Coarsened liver echotexture with nodularity of the liver capsule consistent with cirrhosis. No focal parenchymal abnormality or intrahepatic duct dilation. Portal vein is patent on color Doppler imaging with normal direction of blood flow towards the liver. Other: None. IMPRESSION: 1. Cholelithiasis without evidence of acute cholecystitis. 2. Cirrhosis. Electronically Signed  By: Sharlet Salina M.D.   On: 03/01/2023 11:26   DG Chest Port 1 View Result Date: 03/01/2023 CLINICAL DATA:  Central line placement EXAM: PORTABLE CHEST 1 VIEW COMPARISON:  02/27/2023 FINDINGS: Left internal jugular central line placement with the tip in the SVC. Right dialysis catheter is unchanged. No pneumothorax. Heart and mediastinal contours are within normal limits. No confluent opacities, effusions or overt edema. No acute bony abnormality. IMPRESSION: Left central line tip in the SVC.  No pneumothorax. Electronically Signed   By: Charlett Nose M.D.   On: 03/01/2023 01:19   DG Abd 1  View Result Date: 02/28/2023 CLINICAL DATA:  Abdominal pain with leaking gastrostomy tube EXAM: ABDOMEN - 1 VIEW COMPARISON:  Abdominal radiograph dated 12/31/2022 FINDINGS: Patient is rotated to the right. Percutaneous gastrostomy tube projects over the right upper quadrant. Nonspecific bowel gas pattern. Large volume stool within the ascending colon. No free air or pneumatosis. No abnormal radio-opaque calculi or mass effect. No acute or substantial osseous abnormality. The sacrum and coccyx are partially obscured by overlying bowel contents. IVC filter in-situ. Partially imaged central venous catheter tip projects over the superior cavoatrial junction. IMPRESSION: 1. Nonspecific bowel gas pattern. Large volume stool within the ascending colon. 2. Patient is rotated to the right. Percutaneous gastrostomy tube projects over the right upper quadrant. Electronically Signed   By: Agustin Cree M.D.   On: 02/28/2023 13:12   CT CHEST ABDOMEN PELVIS WO CONTRAST Result Date: 02/27/2023 CLINICAL DATA:  Recent sepsis, discharge from hospital 02/20/2023 EXAM: CT CHEST, ABDOMEN AND PELVIS WITHOUT CONTRAST TECHNIQUE: Multidetector CT imaging of the chest, abdomen and pelvis was performed following the standard protocol without IV contrast. RADIATION DOSE REDUCTION: This exam was performed according to the departmental dose-optimization program which includes automated exposure control, adjustment of the mA and/or kV according to patient size and/or use of iterative reconstruction technique. COMPARISON:  02/13/2023, 12/10/2022 FINDINGS: CT CHEST FINDINGS Cardiovascular: Unenhanced imaging of the heart is stable without pericardial effusion. Calcification of the mitral annulus. Right internal jugular dialysis catheter tip within the superior vena cava. Normal caliber of the thoracic aorta. Stable atherosclerosis of the aorta and coronary vasculature. Mediastinum/Nodes: No enlarged mediastinal, hilar, or axillary lymph nodes.  Thyroid gland, trachea, and esophagus demonstrate no significant findings. Lungs/Pleura: Trace bilateral pleural effusions, left greater than right. Minimal dependent bilateral lower lobe atelectasis. No airspace disease or pneumothorax. Central airways are patent. There is a 6 x 7 mm right lower lobe pulmonary nodule reference image 77/4, unchanged since 12/10/2022. Musculoskeletal: No acute or destructive bony abnormalities. Reconstructed images demonstrate no additional findings. CT ABDOMEN PELVIS FINDINGS Hepatobiliary: Nodular contour of the liver consistent with cirrhosis. No focal parenchymal abnormality identified on this limited unenhanced exam. Stable calcified gallstones without cholecystitis. Pancreas: Unremarkable unenhanced appearance. Spleen: Unremarkable unenhanced appearance. Adrenals/Urinary Tract: Multiple nonobstructing right renal calculi are identified, unchanged in size or number with largest in the upper pole measuring 14 mm. No left-sided calculi. No obstructive uropathy within either kidney. Numerous calcified phleboliths are seen along the course of the right gonadal vein, and should not be confused with ureteral calculi. Stomach/Bowel: No bowel obstruction or ileus. Normal appendix right lower quadrant. Diverticulosis of the sigmoid colon without diverticulitis. No bowel wall thickening or inflammatory change. Percutaneous gastrostomy tube tip within the gastric lumen, with no evidence of complication. Vascular/Lymphatic: Stable IVC filter. Stable aortic atherosclerosis. No pathologic adenopathy within the abdomen or pelvis. Reproductive: Status post hysterectomy. No adnexal masses. Other: No free fluid or  free intraperitoneal gas. Small fat containing hiatal hernia. Musculoskeletal: No acute or destructive bony abnormalities. Reconstructed images demonstrate no additional findings. IMPRESSION: 1. Trace bilateral pleural effusions with minimal dependent lower lobe atelectasis. 2.  Cirrhosis. 3. Stable nonobstructing right renal calculi. 4. Cholelithiasis without cholecystitis. 5. Sigmoid diverticulosis without diverticulitis. 6. 6 mm mean diameter right lower lobe pulmonary nodule, stable for 3 months. Non-contrast chest CT at 3-9 months is recommended to document 1 year stability. If the nodule is stable at time of repeat CT, then future CT at 15-21 months (from today's scan) is considered optional for low-risk patients, but is recommended for high-risk patients. This recommendation follows the consensus statement: Guidelines for Management of Incidental Pulmonary Nodules Detected on CT Images: From the Fleischner Society 2017; Radiology 2017; 284:228-243. 7.  Aortic Atherosclerosis (ICD10-I70.0). Electronically Signed   By: Sharlet Salina M.D.   On: 02/27/2023 20:48   DG Chest Port 1 View Result Date: 02/27/2023 CLINICAL DATA:  Possible sepsis.  Coarse crackles noted bilaterally. EXAM: PORTABLE CHEST 1 VIEW COMPARISON:  One-view chest x-ray 02/13/2023 FINDINGS: Patient is rotated to the right. The left IJ catheter was removed. A new right IJ dialysis catheter is in place. The distal tip is at the cavoatrial junction. The heart size is normal. Lung volumes are low. Left basilar airspace opacity is noted. No focal airspace disease is present in the right. No edema or effusion is present. The visualized soft tissues and bony thorax are unremarkable. IMPRESSION: 1. Low lung volumes with left basilar airspace disease concerning for pneumonia. 2. New right IJ dialysis catheter without complicating features. Electronically Signed   By: Marin Roberts M.D.   On: 02/27/2023 17:52     Assessment and plan-   # Leukocytosis, predominantly neutrophilia with eosinophilia and monocytosis. Patient has had multiple hospitalization during the past few months due to infection. She has had leukocytosis during current hospitalizations as expected. No baseline leukocytosis level during the  interval between hospitalizations. Most likely leukocytosis with neutrophilia/eosinophilia or secondary to drug reaction.  ID recommendation was reviewed.  There is plan to stop antibiotics as she is clinically doing well and has had a negative workup for recurrent infection.  Thrombocytosis could also be reactive. Borderline iron saturation 15. Recommend trial of iron supplementation.  Check peripheral blood flow cytometry, smear, BCR-ABL1 FISH, Jak2 mutation with reflex.  Monitor clinically.   Thank you for allowing me to participate in the care of this patient.   Rickard Patience, MD, PhD Hematology Oncology 05/26/2023

## 2023-05-26 NOTE — TOC Progression Note (Signed)
Transition of Care [facility]) - Progression Note    Patient Details  Name: Alexandra Foster MRN: [id number] Date of Birth: [birth year]  [facility] CM/SW Contact  Chapman Fitch, RN Phone Number: 05/26/2023, 4:29 PM  Clinical Narrative:      Adele Dan with [facility] confirms they can accept patient back with wound care and weekly lab draws       Expected Discharge Plan and Services                                               Social Determinants of Health (SDOH) Interventions SDOH Screenings   Food Insecurity: No Food Insecurity (05/14/2023)  Housing: Low Risk  (05/14/2023)  Transportation Needs: No Transportation Needs (05/14/2023)  Recent Concern: Transportation Needs - Unmet Transportation Needs (03/10/2023)  Utilities: Not At Risk (05/14/2023)  Alcohol Screen: Low Risk  (02/11/2021)  Depression (PHQ2-9): Low Risk  (03/20/2023)  Recent Concern: Depression (PHQ2-9) - Medium Risk (02/27/2023)  Financial Resource Strain: Medium Risk (08/09/2020)  Physical Activity: Inactive (08/09/2020)  Social Connections: Moderately Integrated (05/14/2023)  Stress: No Stress Concern Present (08/09/2020)  Tobacco Use: Medium Risk (05/14/2023)    Readmission Risk Interventions    02/16/2023   12:26 PM 12/26/2022   11:15 AM  Readmission Risk Prevention Plan  Transportation Screening Complete Complete  PCP or Specialist Appt within 3-5 Days Complete   HRI or Home Care Consult Complete   Social Work Consult for Recovery Care Planning/Counseling Complete   Palliative Care Screening Not Applicable   Medication Review Oceanographer) Complete Complete  PCP or Specialist appointment within 3-5 days of discharge  Complete  HRI or Home Care Consult  Complete  SW Recovery Care/Counseling Consult  Complete  Palliative Care Screening  Not Applicable  Skilled Nursing Facility  Not Applicable

## 2023-05-26 NOTE — Consult Note (Addendum)
WOC Nurse wound follow up Refer to previous WOC consult notes on 2/7.  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  Bilat buttocks and sacrum with red moist Stage 3 pressure injuries, 85% red, 15% yellow, mod amt yellow drainage, affected area is approx [id number].3cm  Dressing procedure/placement/frequency: Continue present plan of care; orders for topical treatment have been ordered for bedside nurses to perform as follows: pply Xeroform gauze to wound beds daily, cover with ABD pad or silicone foam whichever is preferred. Change foam dressing Q 3 days or PRN soiling. WOC team will reassess weekly to determine if a change in the plan of care is indicated at that time.  Thank-you,  Cammie Mcgee MSN, RN, CWOCN, [place], CNS [phone]

## 2023-05-27 ENCOUNTER — Other Ambulatory Visit: Payer: Self-pay

## 2023-05-27 DIAGNOSIS — A419 Sepsis, unspecified organism: Secondary | ICD-10-CM | POA: Diagnosis not present

## 2023-05-27 DIAGNOSIS — G9341 Metabolic encephalopathy: Secondary | ICD-10-CM | POA: Diagnosis not present

## 2023-05-27 DIAGNOSIS — J9601 Acute respiratory failure with hypoxia: Secondary | ICD-10-CM | POA: Diagnosis not present

## 2023-05-27 DIAGNOSIS — I9589 Other hypotension: Secondary | ICD-10-CM | POA: Diagnosis not present

## 2023-05-27 LAB — CBC WITH DIFFERENTIAL/PLATELET
Abs Immature Granulocytes: 0.23 10*3/uL — ABNORMAL HIGH (ref 0.00–0.07)
Basophils Absolute: 0.1 10*3/uL (ref 0.0–0.1)
Basophils Relative: 1 %
Eosinophils Absolute: 2.9 10*3/uL — ABNORMAL HIGH (ref 0.0–0.5)
Eosinophils Relative: 16 %
HCT: 31.8 % — ABNORMAL LOW (ref 36.0–46.0)
Hemoglobin: 9.9 g/dL — ABNORMAL LOW (ref 12.0–15.0)
Immature Granulocytes: 1 %
Lymphocytes Relative: 13 %
Lymphs Abs: 2.4 10*3/uL (ref 0.7–4.0)
MCH: 26.1 pg (ref 26.0–34.0)
MCHC: 31.1 g/dL (ref 30.0–36.0)
MCV: 83.9 fL (ref 80.0–100.0)
Monocytes Absolute: 1.2 10*3/uL — ABNORMAL HIGH (ref 0.1–1.0)
Monocytes Relative: 6 %
Neutro Abs: 11.6 10*3/uL — ABNORMAL HIGH (ref 1.7–7.7)
Neutrophils Relative %: 63 %
Platelets: 564 10*3/uL — ABNORMAL HIGH (ref 150–400)
RBC: 3.79 MIL/uL — ABNORMAL LOW (ref 3.87–5.11)
RDW: 18.7 % — ABNORMAL HIGH (ref 11.5–15.5)
Smear Review: NORMAL
WBC: 18.3 10*3/uL — ABNORMAL HIGH (ref 4.0–10.5)
nRBC: 0.1 % (ref 0.0–0.2)

## 2023-05-27 LAB — CULTURE, BLOOD (ROUTINE X 2)
Culture: NO GROWTH
Culture: NO GROWTH

## 2023-05-27 MED ORDER — ZINC OXIDE 40 % EX OINT
TOPICAL_OINTMENT | Freq: Three times a day (TID) | CUTANEOUS | 0 refills | Status: DC | PRN
  Filled 2023-05-27: qty 56, fill #0

## 2023-05-27 MED ORDER — HYDROXYZINE HCL 50 MG PO TABS
50.0000 mg | ORAL_TABLET | Freq: Three times a day (TID) | ORAL | 0 refills | Status: DC | PRN
  Filled 2023-05-27: qty 90, 30d supply, fill #0

## 2023-05-27 MED ORDER — FREE WATER: 30.0000 mL | Status: DC

## 2023-05-27 MED ORDER — NEPRO/CARBSTEADY PO LIQD: 1000.0000 mL | ORAL | Status: DC

## 2023-05-27 MED ORDER — ESCITALOPRAM OXALATE 10 MG PO TABS
10.0000 mg | ORAL_TABLET | Freq: Every day | ORAL | 0 refills | Status: DC
  Filled 2023-05-27: qty 30, 30d supply, fill #0

## 2023-05-27 MED ORDER — METOCLOPRAMIDE HCL 5 MG PO TABS
5.0000 mg | ORAL_TABLET | Freq: Three times a day (TID) | ORAL | 0 refills | Status: DC
  Filled 2023-05-27: qty 80, 20d supply, fill #0

## 2023-05-27 MED ORDER — MIDODRINE HCL 10 MG PO TABS
20.0000 mg | ORAL_TABLET | Freq: Three times a day (TID) | ORAL | 0 refills | Status: DC
  Filled 2023-05-27: qty 180, 30d supply, fill #0

## 2023-05-27 MED ORDER — CHOLESTYRAMINE 4 G PO PACK
4.0000 g | PACK | Freq: Three times a day (TID) | ORAL | 0 refills | Status: DC
  Filled 2023-05-27: qty 90, 30d supply, fill #0

## 2023-05-27 MED ORDER — NYSTATIN 100000 UNIT/GM EX POWD
Freq: Three times a day (TID) | CUTANEOUS | 0 refills | Status: DC
  Filled 2023-05-27: qty 60, 60d supply, fill #0

## 2023-05-27 MED ORDER — TRAZODONE HCL 50 MG PO TABS
25.0000 mg | ORAL_TABLET | Freq: Every evening | ORAL | 0 refills | Status: DC | PRN
  Filled 2023-05-27: qty 15, 30d supply, fill #0

## 2023-05-27 MED ORDER — FERROUS SULFATE 75 (15 FE) MG/ML PO SOLN
60.0000 mg | Freq: Every day | ORAL | 0 refills | Status: DC
  Filled 2023-05-27: qty 50, 12d supply, fill #0

## 2023-05-27 NOTE — Progress Notes (Signed)
[facility]  ROUNDING NOTE   Subjective:   Alexandra Foster is a 73 y.o. female with history of hypertension, coronary artery disease, congestive heart failure, COPD, cirrhosis, diabetes, status post left AKA, status post G-tube placement, and end stage renal disease on hemodialysis.  Patient presents to ED from dialysis at the request of family due to altered mental status.  Patient has been admitted for Lower urinary tract infectious disease [N39.0] Altered mental status, unspecified altered mental status type [R41.82] Pneumonia due to infectious organism, unspecified laterality, unspecified part of lung [J18.9] Sepsis due to gram-negative UTI (HCC) [A41.50, N39.0]  Patient is known to our practice from previous admissions and receives outpatient dialysis treatments at [facility] on a TTS schedule, supervised by [facility].    Update  Patient seen laying in bed No family at bedside Denies requesting early termination of dialysis treatment yesterday   Objective:  Vital signs in last 24 hours:  Temp:  [98.2 F (36.8 C)-99.3 F (37.4 C)] 98.4 F (36.9 C) (02/12 0838) Pulse Rate:  [65-75] 75 (02/12 0838) Resp:  [18-20] 18 (02/12 0838) BP: (101-115)/(53-63) 115/62 (02/12 0838) SpO2:  [95 %-100 %] 100 % (02/12 0838) Weight:  [81.5 kg] 81.5 kg (02/12 0500)  Weight change: -2.4 kg Filed Weights   05/26/23 0800 05/26/23 1102 05/27/23 0500  Weight: 79.1 kg 78.2 kg 81.5 kg    Intake/Output: I/O last 3 completed shifts: In: -  Out: 900 [Other:900]   Intake/Output this shift:  No intake/output data recorded.  Physical Exam: General: NAD  Head: Normocephalic, atraumatic. Moist oral mucosal membranes  Eyes: Anicteric  Lungs:  Clear to auscultation, normal effort  Heart: Regular rate and rhythm  Abdomen:  Soft, nontender, obese  Extremities:  trace peripheral edema.  Neurologic: Alert and oriented to self, moving all four extremities  Skin: No  lesions, dry, flaky  Access: Rt chest permcath     Basic Metabolic Panel: Recent Labs  Lab 05/21/23 0636 05/23/23 0402 05/25/23 0941 05/26/23 0811  NA 135 136 133* 130*  K 3.5 3.3* 4.0 4.1  CL 97* 100 99 98  CO2 22 26 23 22   GLUCOSE 95 115* 112* 88  BUN 78* 19 57* 81*  CREATININE 4.45* 0.90 3.77* 4.35*  CALCIUM 9.4 8.6* 9.6 9.4  PHOS 4.7* 2.9  --  6.6*    Liver Function Tests: Recent Labs  Lab 05/21/23 0636 05/23/23 0402 05/26/23 0811  ALBUMIN 2.1* 3.3* 2.4*   No results for input(s): "LIPASE", "AMYLASE" in the last 168 hours. No results for input(s): "AMMONIA" in the last 168 hours.  CBC: Recent Labs  Lab 05/23/23 0402 05/24/23 0412 05/25/23 0941 05/26/23 0526 05/27/23 0559  WBC 21.3* 19.5* 22.3* 21.6* 18.3*  NEUTROABS 12.4* 12.8*  --  14.4* 11.6*  HGB 9.6* 9.9* 10.4* 9.7* 9.9*  HCT 29.7* 31.0* 32.6* 31.0* 31.8*  MCV 83.0 82.9 83.8 83.8 83.9  PLT 453* 552* 623* 653* 564*    Cardiac Enzymes: No results for input(s): "CKTOTAL", "CKMB", "CKMBINDEX", "TROPONINI" in the last 168 hours.  BNP: Invalid input(s): "POCBNP"  CBG: Recent Labs  Lab 05/23/23 1719 05/25/23 1717  GLUCAP 88 111*    Microbiology: Results for orders placed or performed during the hospital encounter of 05/13/23  Resp panel by RT-PCR (RSV, Flu A&B, Covid) Anterior Nasal Swab     Status: None   Collection Time: 05/12/23  4:37 PM   Specimen: Anterior Nasal Swab  Result Value Ref Range Status   SARS  Coronavirus 2 by RT PCR NEGATIVE NEGATIVE Final    Comment: (NOTE) SARS-CoV-2 target nucleic acids are NOT DETECTED.  The SARS-CoV-2 RNA is generally detectable in upper respiratory specimens during the acute phase of infection. The lowest concentration of SARS-CoV-2 viral copies this assay can detect is 138 copies/mL. A negative result does not preclude SARS-Cov-2 infection and should not be used as the sole basis for treatment or other patient management decisions. A negative  result may occur with  improper specimen collection/handling, submission of specimen other than nasopharyngeal swab, presence of viral mutation(s) within the areas targeted by this assay, and inadequate number of viral copies(<138 copies/mL). A negative result must be combined with clinical observations, patient history, and epidemiological information. The expected result is Negative.  Fact Sheet for Patients:  BloggerCourse.com  Fact Sheet for Healthcare Providers:  SeriousBroker.it  This test is no t yet approved or cleared by the [place] FDA and  has been authorized for detection and/or diagnosis of SARS-CoV-2 by FDA under an Emergency Use Authorization (EUA). This EUA will remain  in effect (meaning this test can be used) for the duration of the COVID-19 declaration under Section 564(b)(1) of the Act, 21 U.S.C.section 360bbb-3(b)(1), unless the authorization is terminated  or revoked sooner.       Influenza A by PCR NEGATIVE NEGATIVE Final   Influenza B by PCR NEGATIVE NEGATIVE Final    Comment: (NOTE) The Xpert Xpress SARS-CoV-2/FLU/RSV plus assay is intended as an aid in the diagnosis of influenza from Nasopharyngeal swab specimens and should not be used as a sole basis for treatment. Nasal washings and aspirates are unacceptable for Xpert Xpress SARS-CoV-2/FLU/RSV testing.  Fact Sheet for Patients: BloggerCourse.com  Fact Sheet for Healthcare Providers: SeriousBroker.it  This test is not yet approved or cleared by the [place] FDA and has been authorized for detection and/or diagnosis of SARS-CoV-2 by FDA under an Emergency Use Authorization (EUA). This EUA will remain in effect (meaning this test can be used) for the duration of the COVID-19 declaration under Section 564(b)(1) of the Act, 21 U.S.C. section 360bbb-3(b)(1), unless the authorization is  terminated or revoked.     Resp Syncytial Virus by PCR NEGATIVE NEGATIVE Final    Comment: (NOTE) Fact Sheet for Patients: BloggerCourse.com  Fact Sheet for Healthcare Providers: SeriousBroker.it  This test is not yet approved or cleared by the [place] FDA and has been authorized for detection and/or diagnosis of SARS-CoV-2 by FDA under an Emergency Use Authorization (EUA). This EUA will remain in effect (meaning this test can be used) for the duration of the COVID-19 declaration under Section 564(b)(1) of the Act, 21 U.S.C. section 360bbb-3(b)(1), unless the authorization is terminated or revoked.  Performed at [facility], [address] [place], [place] [address]   Culture, blood (routine x 2)     Status: None   Collection Time: 05/12/23 11:45 PM   Specimen: BLOOD  Result Value Ref Range Status   Specimen Description BLOOD RIGHT ANTECUBITAL  Final   Special Requests   Final    BOTTLES DRAWN AEROBIC AND ANAEROBIC Blood Culture adequate volume   Culture   Final    NO GROWTH 5 DAYS Performed at [facility], [address] [place], [place] [address]    Report Status 05/18/2023 FINAL  Final  Culture, blood (routine x 2)     Status: None   Collection Time: 05/13/23 12:00 AM   Specimen: BLOOD  Result Value Ref Range Status  Specimen Description BLOOD LEFT WRIST  Final   Special Requests   Final    AEROBIC BOTTLE ONLY Blood Culture results may not be optimal due to an inadequate volume of blood received in culture bottles   Culture   Final    NO GROWTH 5 DAYS Performed at [facility], [address] [place], [place] [address]    Report Status 05/18/2023 FINAL  Final  Urine Culture (for pregnant, neutropenic or urologic patients or patients with an indwelling urinary catheter)     Status: Abnormal   Collection Time: 05/13/23 12:10 AM   Specimen: Urine, Clean Catch  Result Value Ref  Range Status   Specimen Description   Final    URINE, CLEAN CATCH Performed at [facility], [address], [place], [place] [address]    Special Requests   Final    NONE Performed at [facility], [address], [place], [place] [address]    Culture >=100,000 COLONIES/mL KLEBSIELLA PNEUMONIAE (A)  Final   Report Status 05/15/2023 FINAL  Final   Organism ID, Bacteria KLEBSIELLA PNEUMONIAE (A)  Final      Susceptibility   Klebsiella pneumoniae - MIC*    AMPICILLIN RESISTANT Resistant     CEFAZOLIN <=4 SENSITIVE Sensitive     CEFEPIME <=0.12 SENSITIVE Sensitive     CEFTRIAXONE <=0.25 SENSITIVE Sensitive     CIPROFLOXACIN <=0.25 SENSITIVE Sensitive     GENTAMICIN <=1 SENSITIVE Sensitive     IMIPENEM <=0.25 SENSITIVE Sensitive     NITROFURANTOIN <=16 SENSITIVE Sensitive     TRIMETH/SULFA <=20 SENSITIVE Sensitive     AMPICILLIN/SULBACTAM 4 SENSITIVE Sensitive     PIP/TAZO <=4 SENSITIVE Sensitive ug/mL    * >=100,000 COLONIES/mL KLEBSIELLA PNEUMONIAE  MRSA Next Gen by PCR, Nasal     Status: Abnormal   Collection Time: 05/14/23  4:30 PM   Specimen: Nasal Mucosa; Nasal Swab  Result Value Ref Range Status   MRSA by PCR Next Gen DETECTED (A) NOT DETECTED Final    Comment: RESULT CALLED TO, READ BACK BY AND VERIFIED WITH: DORATHY MUHORO @1908  ON 05/14/23 SKL (NOTE) The GeneXpert MRSA Assay (FDA approved for NASAL specimens only), is one component of a comprehensive MRSA colonization surveillance program. It is not intended to diagnose MRSA infection nor to guide or monitor treatment for MRSA infections. Test performance is not FDA approved in patients less than 41 years old. Performed at [facility], [address] [place], [place] [address]   Culture, blood (Routine X 2) w Reflex to ID Panel     Status: None   Collection Time: 05/22/23  3:04 PM   Specimen: BLOOD RIGHT ARM  Result Value Ref Range Status   Specimen Description BLOOD RIGHT ARM  Final    Special Requests   Final    BOTTLES DRAWN AEROBIC ONLY Blood Culture results may not be optimal due to an inadequate volume of blood received in culture bottles   Culture   Final    NO GROWTH 5 DAYS Performed at [facility], [address], [place], [place] [address]    Report Status 05/27/2023 FINAL  Final  Culture, blood (Routine X 2) w Reflex to ID Panel     Status: None   Collection Time: 05/22/23  3:04 PM   Specimen: BLOOD RIGHT HAND  Result Value Ref Range Status   Specimen Description BLOOD RIGHT HAND  Final   Special Requests   Final    BOTTLES DRAWN AEROBIC  ONLY Blood Culture results may not be optimal due to an inadequate volume of blood received in culture bottles   Culture   Final    NO GROWTH 5 DAYS Performed at [facility], [address] [place], [place] [address]    Report Status 05/27/2023 FINAL  Final    Coagulation Studies: No results for input(s): "LABPROT", "INR" in the last 72 hours.   Urinalysis: No results for input(s): "COLORURINE", "LABSPEC", "PHURINE", "GLUCOSEU", "HGBUR", "BILIRUBINUR", "KETONESUR", "PROTEINUR", "UROBILINOGEN", "NITRITE", "LEUKOCYTESUR" in the last 72 hours.  Invalid input(s): "APPERANCEUR"     Imaging: No results found.    Medications:    feeding supplement (NEPRO CARB STEADY) 1,000 mL (05/25/23 0453)    ascorbic acid  250 mg Per Tube Daily   atorvastatin  20 mg Per Tube Daily   vitamin D3  2,000 Units Per Tube Daily   cholestyramine  4 g Per Tube TID   epoetin alfa-epbx (RETACRIT) injection  4,000 Units Intravenous Q T,Th,Sa-HD   escitalopram  10 mg Per Tube Daily   famotidine  10 mg Per Tube Daily   ferrous sulfate  60 mg of iron Per Tube Daily   fludrocortisone  0.1 mg Per Tube Daily   free water  30 mL Per Tube Q4H   heparin injection (subcutaneous)  5,000 Units Subcutaneous Q8H   hydrOXYzine  50 mg Per Tube TID   metoCLOPramide  5 mg Oral TID AC & HS   midodrine  15 mg Per Tube TID with  meals   multivitamin  1 tablet Oral QHS   nutrition supplement (JUVEN)  1 packet Per Tube BID BM   nystatin   Topical TID   mouth rinse  15 mL Mouth Rinse 4 times per day   acetaminophen **OR** acetaminophen, chlorpheniramine-HYDROcodone, diphenhydrAMINE, ipratropium-albuterol, liver oil-zinc oxide, loperamide HCl, LORazepam, magnesium hydroxide, ondansetron **OR** ondansetron (ZOFRAN) IV, mouth rinse, traZODone  Assessment/ Plan:  [name] is a 73 y.o.  female with history of hypertension, coronary artery disease, congestive heart failure, COPD, cirrhosis, diabetes, status post left AKA, status post G-tube placement, and end stage renal disease on hemodialysis.  Patient presents to ED from dialysis at the request of family due to altered mental status.  Patient has been admitted for Lower urinary tract infectious disease [N39.0] Altered mental status, unspecified altered mental status type [R41.82] Pneumonia due to infectious organism, unspecified laterality, unspecified part of lung [J18.9] Sepsis due to gram-negative UTI (HCC) [A41.50, N39.0]  [facility]/TTs/Rt permcath/75.6kg  End-stage renal disease: Patient requested early termination of treatment 1 hour prior to completion.  Next treatment scheduled for Thursday  #2.  Sepsis, gram negative UTI. Received Lactated ringers in ED. Urine culture with Klebsiella.  Completed antibiotic regimen.  White count peaked at 30.1.  ID consulted.  White count slowly improving  #3: Anemia of chronic kidney disease Lab Results  Component Value Date   HGB 9.9 (L) 05/27/2023  Hemoglobin 9.9, within goal Continue low dose EPO with dialysis.    #4: Secondary Hyperparathyroidism: with outpatient labs: PTH 35, phosphorus 7.6, calcium 9.4 on 04/21/23.   Lab Results  Component Value Date   CALCIUM 9.4 05/26/2023   PHOS 6.6 (H) 05/26/2023    Will monitor bone minerals for now. Can consider binders as outpatient.     LOS:  14 Yaviel Kloster 2/12/202511:05 AM

## 2023-05-27 NOTE — TOC Transition Note (Signed)
Transition of Care [facility]) - Discharge Note   Patient Details  Name: Alexandra Foster MRN: [id number] Date of Birth: 12/26/1950  [facility] CM/SW Contact:  Chapman Fitch, RN Phone Number: 05/27/2023, 11:20 AM   Clinical Narrative:     Patient to discharge today today Daughter notified EMS transport called.  Notified them that patient will be ready at 1 pm after meds delivered to bed.  Daughter states that patients spouse will be at the home to accept her  [name] with [facility] notified of discharge        Patient Goals and CMS Choice            Discharge Placement                       Discharge Plan and Services Additional resources added to the After Visit Summary for                                       Social Drivers of Health (SDOH) Interventions SDOH Screenings   Food Insecurity: No Food Insecurity (05/14/2023)  Housing: Low Risk  (05/14/2023)  Transportation Needs: No Transportation Needs (05/14/2023)  Recent Concern: Transportation Needs - Unmet Transportation Needs (03/10/2023)  Utilities: Not At Risk (05/14/2023)  Alcohol Screen: Low Risk  (02/11/2021)  Depression (PHQ2-9): Low Risk  (03/20/2023)  Recent Concern: Depression (PHQ2-9) - Medium Risk (02/27/2023)  Financial Resource Strain: Medium Risk (08/09/2020)  Physical Activity: Inactive (08/09/2020)  Social Connections: Moderately Integrated (05/14/2023)  Stress: No Stress Concern Present (08/09/2020)  Tobacco Use: Medium Risk (05/14/2023)     Readmission Risk Interventions    02/16/2023   12:26 PM 12/26/2022   11:15 AM  Readmission Risk Prevention Plan  Transportation Screening Complete Complete  PCP or Specialist Appt within 3-5 Days Complete   HRI or Home Care Consult Complete   Social Work Consult for Recovery Care Planning/Counseling Complete   Palliative Care Screening Not Applicable   Medication Review Oceanographer) Complete Complete  PCP or  Specialist appointment within 3-5 days of discharge  Complete  HRI or Home Care Consult  Complete  SW Recovery Care/Counseling Consult  Complete  Palliative Care Screening  Not Applicable  Skilled Nursing Facility  Not Applicable

## 2023-05-27 NOTE — Assessment & Plan Note (Signed)
Hemoglobin upon discharge 9.9.

## 2023-05-27 NOTE — Assessment & Plan Note (Signed)
Secondary to infection and/or drug reaction.

## 2023-05-27 NOTE — Discharge Summary (Signed)
Physician Discharge Summary   Patient: Alexandra Foster MRN: [id number] DOB: 25-Apr-1950  Admit date:     05/13/2023  Discharge date: 05/27/23  Discharge Physician: Alford Highland   PCP: [name], MD   Recommendations at discharge:   Follow-up PCP 5 days Follow-up with dialysis  Discharge Diagnoses: Principal Problem:   Severe sepsis (HCC) Active Problems:   Acute hypoxic respiratory failure (HCC)   Hypotension   Aspiration pneumonia (HCC)   Acute metabolic encephalopathy   ESRD on hemodialysis (HCC)   Dyslipidemia   Acute on chronic diastolic CHF (congestive heart failure) (HCC)   Anxiety and depression   Obesity (BMI 30-39.9)   Anemia of chronic disease   Diarrhea   Stage II decubitus ulcer (HCC)   Itching   Pneumonia due to infectious organism   Urinary tract infection due to Klebsiella species   Hyperkalemia   Eosinophilia   Altered mental status   Leukocytosis   Thrombocytosis    Hospital Course: 73 year old female with end-stage renal disease on hemodialysis, osteoarthritis, asthma, COPD, type 2 diabetes mellitus, GERD, hypertension and paroxysmal atrial fibrillation and obstructive sleep apnea on CPAP presented to the ER with confusion over the last 3 days with intermittent vomiting.  She was admitted with clinical sepsis secondary to right upper lobe pneumonia likely aspiration and urinary infection.  Started on antibiotic.  1/29.  some cough and shortness of breath.  Restarted tube feeds.  Fever of 103. 2/1.  Rapid response at dialysis.  Patient had large brown BM then was less responsive and has shortness of breath.  Bipap ordered.  ABG.  Iv albumin.  Spoke with nephrology to remove fluid.  CT scan showing worsening pneumonia.  Antibiotics switched to Zosyn and vancomycin 2/2.  Respiratory status much improved.  Off BiPAP on 3 L of oxygen.  BP lower side increased midodrine to 15 mg 3 times daily. 2/3. On 1 L O2.  Steroids discontinued. 2/4.  White  blood cell count 30.1.  Diarrhea not documented.  I assumed care 05/20/23 WBC improved to 18.8k Pt remained on IV Zosyn through 2/6, stopped having completed total of 10 days antibiotic coverage. When tried off antibiotics, WBC again increased. ID was consulted and PO Zyvox was started for additional MRSA coverage pending repeat cultures and attempt to identify any infection source.  Blood cultures negative at day 4.  WBC is staying in low 20's but pt asymptomatic.    2/12.  Patient feeling fine.  White blood cell count down to 18.3.  Antibiotics have been discontinued.  Patient will be discharged home with home health.    Assessment and Plan: * Severe sepsis (HCC) Present on admission with leukocytosis fever tachycardia and tachypnea and acute respiratory failure.  Patient has right upper lobe pneumonia and Klebsiella growing out of urine culture.  WBC climbed to peak 30.1 (note pt was also on steroids). Antibiotics switched on 2/1 to Zosyn and vancomycin.   ID switched antibiotics over to Zyvox and completed course Repeat blood cultures on 2/7 were negative for 5 days. Suspect drug reaction, given eosinophilia and skin peeling noted after vancomycin.  Acute hypoxic respiratory failure (HCC) With respiratory distress on 2/1 was placed on BiPAP.  Patient is on room air.  This problem has resolved.  Hypotension On midodrine and florinef for chronically low blood pressure.    Aspiration pneumonia (HCC) Multifocal pneumonia seen on CT scan. See Severe Sepsis   Acute metabolic encephalopathy Mental status improved.  ESRD on hemodialysis [facility]) Dialysis  per Nephrology  Dyslipidemia On Lipitor  Acute on chronic diastolic CHF (congestive heart failure) (HCC) Likely more worsening pneumonia rather than heart failure.  Dialysis to manage fluid.  Thrombocytosis Secondary to infection and/or drug reaction.  Hyperkalemia Improved with dialysis  Urinary tract infection due to  Klebsiella species Treated with antibiotics  Pneumonia due to infectious organism See aspiration pneumonia  Itching On atarax standing dose and as needed Benadryl  Stage II decubitus ulcer (HCC) Buttocks, see full description below, documented on 1/30.  Diarrhea As needed Imodium and standing dose cholestyramine.  Anemia of chronic disease Hemoglobin upon discharge 9.9.  Obesity (BMI 30-39.9) BMI 30.84.  Anxiety and depression On Klonopin and Lexapro.         Consultants: Infectious disease, hematology Procedures performed: None Disposition: Home health Diet recommendation:  Tube feedings DISCHARGE MEDICATION: Allergies as of 05/27/2023       Reactions   Augmentin [amoxicillin-pot Clavulanate] Dermatitis   Chlorhexidine Dermatitis   Vancomycin Rash   Skin peeling   Zosyn [piperacillin-tazobactam In Dex] Rash        Medication List     TAKE these medications    acetaminophen 325 MG tablet Commonly known as: TYLENOL Place 2 tablets (650 mg total) into feeding tube every 6 (six) hours as needed for mild pain (or Fever >/= 101).   ascorbic acid 500 MG tablet Commonly known as: VITAMIN C Place 0.5 tablets (250 mg total) into feeding tube daily.   atorvastatin 20 MG tablet Commonly known as: LIPITOR Place 1 tablet (20 mg total) into feeding tube daily.   cholestyramine 4 g packet Commonly known as: QUESTRAN Place 1 packet (4 g total) into feeding tube 3 (three) times daily.   clonazePAM 0.5 MG tablet Commonly known as: KLONOPIN TAKE 1/2 TO 1 (ONE-HALF TO ONE) TABLET BY MOUTH ONCE DAILY AS NEEDED FOR ANXIETY   escitalopram 10 MG tablet Commonly known as: LEXAPRO Place 1 tablet (10 mg total) into feeding tube daily.   famotidine 10 MG tablet Commonly known as: PEPCID Place 1 tablet (10 mg total) into feeding tube daily.   feeding supplement (NEPRO CARB STEADY) Liqd Place 1,000 mLs into feeding tube continuous. 45 ml/hour What changed:  how  much to take when to take this additional instructions   ferrous sulfate 75 (15 Fe) MG/ML Soln Commonly known as: FER-IN-SOL Place 4 mLs (60 mg of iron total) into feeding tube daily.   fludrocortisone 0.1 MG tablet Commonly known as: FLORINEF PLACE TWO TABLETS INTO FEEDING TUBE DAILY   free water Soln Place 30 mLs into feeding tube every 4 (four) hours. What changed: how much to take   glucose blood test strip Use 1 strip to check blood glucose levels 2 times daily   hydrOXYzine 50 MG tablet Commonly known as: ATARAX Take 1 tablet (50 mg total) by mouth 3 (three) times daily as needed for itching. What changed: reasons to take this   liver oil-zinc oxide 40 % ointment Commonly known as: DESITIN Apply topically 3 (three) times daily as needed for irritation. Apply to skin around G tube 3 times daily as needed for skin irritation   metoCLOPramide 5 MG tablet Commonly known as: REGLAN Place 1 tablet (5 mg total) into feeding tube 4 (four) times daily -  before meals and at bedtime. What changed: See the new instructions.   midodrine 10 MG tablet Commonly known as: PROAMATINE Take 2 tablets (20 mg total) by mouth 3 (three) times daily. What changed: See  the new instructions.   multivitamin Tabs tablet PLACE ONE TABLET INTO FEEDING TUBE AT BEDTIME   nystatin powder Commonly known as: MYCOSTATIN/NYSTOP Apply topically 3 (three) times daily.   traZODone 50 MG tablet Commonly known as: DESYREL Take 0.5 tablets (25 mg total) by mouth at bedtime as needed for sleep.   vitamin D3 25 MCG tablet Commonly known as: CHOLECALCIFEROL Place 2 tablets (2,000 Units total) into feeding tube daily.        Discharge Exam: Filed Weights   05/26/23 0800 05/26/23 1102 05/27/23 0500  Weight: 79.1 kg 78.2 kg 81.5 kg   Physical Exam HENT:     Head: Normocephalic.  Eyes:     General: Lids are normal.     Conjunctiva/sclera: Conjunctivae normal.  Cardiovascular:     Rate and  Rhythm: Normal rate and regular rhythm.     Heart sounds: Normal heart sounds, S1 normal and S2 normal.  Pulmonary:     Effort: No accessory muscle usage.     Breath sounds: Examination of the right-lower field reveals decreased breath sounds. Examination of the left-lower field reveals decreased breath sounds. Decreased breath sounds present. No wheezing, rhonchi or rales.  Abdominal:     Palpations: Abdomen is soft.     Tenderness: There is no abdominal tenderness.  Musculoskeletal:     Right lower leg: No swelling.     Left Lower Extremity: Left leg is amputated above knee.  Skin:    General: Skin is warm.     Findings: No rash.  Neurological:     Mental Status: She is alert.      Condition at discharge: fair  The results of significant diagnostics from this hospitalization (including imaging, microbiology, ancillary and laboratory) are listed below for reference.   Imaging Studies: DG Chest Port 1 View Result Date: 05/22/2023 CLINICAL DATA:  Altered mental status and leukocytosis. EXAM: PORTABLE CHEST 1 VIEW COMPARISON:  05/16/2023 FINDINGS: Stable heart size and appearance of tunneled dialysis catheter. Airspace disease of the right lung appears improved with some mild patchy asymmetric opacity remaining compared to the left lung. This could represent asymmetric edema of versus pneumonia. No pleural fluid or pneumothorax identified. The visualized skeletal structures are unremarkable. IMPRESSION: Improved airspace disease of the right lung with some mild patchy asymmetric opacity remaining compared to the left lung. This could represent asymmetric edema versus pneumonia. Electronically Signed   By: Irish Lack M.D.   On: 05/22/2023 13:37   CT Angio Chest Pulmonary Embolism (PE) W or WO Contrast Result Date: 05/16/2023 CLINICAL DATA:  Shortness of breath. EXAM: CT ANGIOGRAPHY CHEST WITH CONTRAST TECHNIQUE: Multidetector CT imaging of the chest was performed using the standard  protocol during bolus administration of intravenous contrast. Multiplanar CT image reconstructions and MIPs were obtained to evaluate the vascular anatomy. RADIATION DOSE REDUCTION: This exam was performed according to the departmental dose-optimization program which includes automated exposure control, adjustment of the mA and/or kV according to patient size and/or use of iterative reconstruction technique. CONTRAST:  75mL OMNIPAQUE IOHEXOL 350 MG/ML SOLN COMPARISON:  February 27, 2023. FINDINGS: Cardiovascular: Satisfactory opacification of the pulmonary arteries to the segmental level. No evidence of pulmonary embolism. Normal heart size. No pericardial effusion. Mediastinum/Nodes: No enlarged mediastinal, hilar, or axillary lymph nodes. Thyroid gland, trachea, and esophagus demonstrate no significant findings. Lungs/Pleura: No pneumothorax or pleural effusion is noted. Significantly increased patchy airspace opacities are noted throughout the right lung and to a lesser degree in the left lung most consistent  with worsening multifocal pneumonia. Upper Abdomen: Hepatic cirrhosis. Musculoskeletal: No chest wall abnormality. No acute or significant osseous findings. Review of the MIP images confirms the above findings. IMPRESSION: No definite evidence of pulmonary embolus. Significant increased patchy airspace opacities are noted throughout both lungs, right much greater than left, most consistent with worsening multifocal pneumonia. Hepatic cirrhosis. Electronically Signed   By: Lupita Raider M.D.   On: 05/16/2023 17:08   DG Chest Port 1 View Result Date: 05/16/2023 CLINICAL DATA:  Shortness of breath during dialysis EXAM: PORTABLE CHEST 1 VIEW COMPARISON:  05/12/2023 FINDINGS: Right IJ  dialysis catheter tip at low SVC. Midline trachea. Normal heart size for level of inspiration. Mild right hemidiaphragm elevation. No pleural effusion or pneumothorax. Diffuse interstitial prominence. More confluent right mid  and upper lung airspace disease is slightly more distinct today. Mild left base subsegmental atelectasis is unchanged. Numerous leads and wires project over the chest. IMPRESSION: Right mid and upper lung airspace disease is slightly more distinct today, favoring pneumonia over asymmetric pulmonary edema. Electronically Signed   By: Jeronimo Greaves M.D.   On: 05/16/2023 12:14   DG ABDOMEN PEG TUBE LOCATION Result Date: 05/13/2023 CLINICAL DATA:  G-tube check EXAM: ABDOMEN - 1 VIEW COMPARISON:  CT 05/13/2023 FINDINGS: Technologist notes indicate 30 cc Gastrografin administered through the percutaneous gastrostomy tube. There is contrast throughout the stomach. Gastrostomy tube in the distal stomach. IMPRESSION: 1. Percutaneous gastrostomy tube appears in proper location on AP view of the abdomen. 2. Injected contrast outlines the stomach. Electronically Signed   By: Genevive Bi M.D.   On: 05/13/2023 21:42   CT ANGIO UP EXTREM LEFT W &/OR WO CONTAST Result Date: 05/13/2023 CLINICAL DATA:  Decreased blood pressure in left arm compared to the right EXAM: CT ANGIOGRAPHY OF THE LEFT UPPEREXTREMITY TECHNIQUE: Multidetector CT imaging of the left upper extremitywas performed using the standard protocol during bolus administration of intravenous contrast. Multiplanar CT image reconstructions and MIPs were obtained to evaluate the vascular anatomy. RADIATION DOSE REDUCTION: This exam was performed according to the departmental dose-optimization program which includes automated exposure control, adjustment of the mA and/or kV according to patient size and/or use of iterative reconstruction technique. CONTRAST:  OMNIPAQUE IOHEXOL 350 MG/ML SOLN COMPARISON:  None Available. FINDINGS: Vascular: Thoracic aorta shows no aneurysmal dilatation or dissection. No cardiac enlargement is seen. The origins of the right innominate, left common carotid and left subclavian arteries are all widely patent. The left subclavian  artery and left axillary artery are widely patent. Considerable artifact from the injected contrast bolus in the left arm is noted limiting the exam. The brachial artery appears patent although very obscured by the contrast bolus within the venous structures. The distal branches below the elbow are poorly visualized due to the timing of the contrast bolus. Pulmonary artery as visualized is within normal limits no pulmonary embolus is seen. The abdominal aorta is within normal limits. IVC filter is noted in satisfactory position. Dialysis catheter is noted in the right jugular vein. Nonvascular: Thoracic inlet is within normal limits. Scattered nodular changes are noted in the breasts bilaterally of uncertain significance. Correlation with prior mammography would be helpful. These were not present on prior CT in November of 2024. No hilar or mediastinal adenopathy is noted. The lungs demonstrate some patchy atelectatic changes bilaterally. Diffuse parenchymal densities are seen particularly in the right lung. This may represent some volume overload and edema. Possibility of underlying inflammatory change could not be totally excluded. Included  structures in the abdomen show evidence of gastrostomy catheter in satisfactory position. Scattered diverticular change of the colon is noted without diverticulitis. Bony structures show degenerative change of the thoracolumbar spine. Review of the MIP images confirms the above findings. IMPRESSION: No findings to suggest left subclavian or axillary arterial stenosis are seen. The more distal arterial structures to include the brachial, radial and ulnar arteries are poorly visualized in part due to injection of contrast in the same arm as well as timing of the contrast bolus. Changes in lungs particularly on the right which may represent edema although the possibility of underlying inflammatory change deserves consideration. Subcutaneous nodular changes in the breasts  bilaterally not seen on prior exam in November of 2024. The need for further follow-up can be determined on a clinical basis. Electronically Signed   By: Alcide Clever M.D.   On: 05/13/2023 20:07   DG Chest 1 View Result Date: 05/12/2023 CLINICAL DATA:  Shortness of breath EXAM: CHEST  1 VIEW COMPARISON:  03/06/2023 FINDINGS: The patient is rotated to the right on today's radiograph, reducing diagnostic sensitivity and specificity. Dialysis catheter tip: Lower SVC. Hazy opacity projects over the right upper lobe. Some of this is rotational and cause by soft tissues of the chest wall and asymmetric inclusion of the scapula, but accounting for this possibility of right upper lobe bronchopneumonia or asymmetric edema is not excluded. Left lung appears clear. Thoracic spondylosis. IMPRESSION: 1. Hazy opacity projects over the right upper lobe. Some of this is rotational and cause by soft tissues of the chest wall and asymmetric inclusion of the scapula, but accounting for this possibility of right upper lobe bronchopneumonia or asymmetric edema is not excluded. 2. Dialysis catheter tip: Lower SVC. Electronically Signed   By: Gaylyn Rong M.D.   On: 05/12/2023 17:29    Microbiology: Results for orders placed or performed during the hospital encounter of 05/13/23  Resp panel by RT-PCR (RSV, Flu A&B, Covid) Anterior Nasal Swab     Status: None   Collection Time: 05/12/23  4:37 PM   Specimen: Anterior Nasal Swab  Result Value Ref Range Status   SARS Coronavirus 2 by RT PCR NEGATIVE NEGATIVE Final    Comment: (NOTE) SARS-CoV-2 target nucleic acids are NOT DETECTED.  The SARS-CoV-2 RNA is generally detectable in upper respiratory specimens during the acute phase of infection. The lowest concentration of SARS-CoV-2 viral copies this assay can detect is 138 copies/mL. A negative result does not preclude SARS-Cov-2 infection and should not be used as the sole basis for treatment or other patient  management decisions. A negative result may occur with  improper specimen collection/handling, submission of specimen other than nasopharyngeal swab, presence of viral mutation(s) within the areas targeted by this assay, and inadequate number of viral copies(<138 copies/mL). A negative result must be combined with clinical observations, patient history, and epidemiological information. The expected result is Negative.  Fact Sheet for Patients:  BloggerCourse.com  Fact Sheet for Healthcare Providers:  SeriousBroker.it  This test is no t yet approved or cleared by the [place] FDA and  has been authorized for detection and/or diagnosis of SARS-CoV-2 by FDA under an Emergency Use Authorization (EUA). This EUA will remain  in effect (meaning this test can be used) for the duration of the COVID-19 declaration under Section 564(b)(1) of the Act, 21 U.S.C.section 360bbb-3(b)(1), unless the authorization is terminated  or revoked sooner.       Influenza A by PCR NEGATIVE NEGATIVE Final  Influenza B by PCR NEGATIVE NEGATIVE Final    Comment: (NOTE) The Xpert Xpress SARS-CoV-2/FLU/RSV plus assay is intended as an aid in the diagnosis of influenza from Nasopharyngeal swab specimens and should not be used as a sole basis for treatment. Nasal washings and aspirates are unacceptable for Xpert Xpress SARS-CoV-2/FLU/RSV testing.  Fact Sheet for Patients: BloggerCourse.com  Fact Sheet for Healthcare Providers: SeriousBroker.it  This test is not yet approved or cleared by the [place] FDA and has been authorized for detection and/or diagnosis of SARS-CoV-2 by FDA under an Emergency Use Authorization (EUA). This EUA will remain in effect (meaning this test can be used) for the duration of the COVID-19 declaration under Section 564(b)(1) of the Act, 21 U.S.C. section 360bbb-3(b)(1),  unless the authorization is terminated or revoked.     Resp Syncytial Virus by PCR NEGATIVE NEGATIVE Final    Comment: (NOTE) Fact Sheet for Patients: BloggerCourse.com  Fact Sheet for Healthcare Providers: SeriousBroker.it  This test is not yet approved or cleared by the [place] FDA and has been authorized for detection and/or diagnosis of SARS-CoV-2 by FDA under an Emergency Use Authorization (EUA). This EUA will remain in effect (meaning this test can be used) for the duration of the COVID-19 declaration under Section 564(b)(1) of the Act, 21 U.S.C. section 360bbb-3(b)(1), unless the authorization is terminated or revoked.  Performed at [facility], [address] [place], [place] [address]   Culture, blood (routine x 2)     Status: None   Collection Time: 05/12/23 11:45 PM   Specimen: BLOOD  Result Value Ref Range Status   Specimen Description BLOOD RIGHT ANTECUBITAL  Final   Special Requests   Final    BOTTLES DRAWN AEROBIC AND ANAEROBIC Blood Culture adequate volume   Culture   Final    NO GROWTH 5 DAYS Performed at [facility], [address] [place], [place] [address]    Report Status 05/18/2023 FINAL  Final  Culture, blood (routine x 2)     Status: None   Collection Time: 05/13/23 12:00 AM   Specimen: BLOOD  Result Value Ref Range Status   Specimen Description BLOOD LEFT WRIST  Final   Special Requests   Final    AEROBIC BOTTLE ONLY Blood Culture results may not be optimal due to an inadequate volume of blood received in culture bottles   Culture   Final    NO GROWTH 5 DAYS Performed at [facility], [address] [place], [place] [address]    Report Status 05/18/2023 FINAL  Final  Urine Culture (for pregnant, neutropenic or urologic patients or patients with an indwelling urinary catheter)     Status: Abnormal   Collection Time: 05/13/23 12:10 AM   Specimen: Urine,  Clean Catch  Result Value Ref Range Status   Specimen Description   Final    URINE, CLEAN CATCH Performed at [facility], [address] [place], [place] [address]    Special Requests   Final    NONE Performed at [facility], [address], [place], [place] [address]    Culture >=100,000 COLONIES/mL KLEBSIELLA PNEUMONIAE (A)  Final   Report Status 05/15/2023 FINAL  Final   Organism ID, Bacteria KLEBSIELLA PNEUMONIAE (A)  Final      Susceptibility   Klebsiella pneumoniae - MIC*    AMPICILLIN RESISTANT Resistant     CEFAZOLIN <=4 SENSITIVE Sensitive     CEFEPIME <=0.12 SENSITIVE Sensitive     CEFTRIAXONE <=  0.25 SENSITIVE Sensitive     CIPROFLOXACIN <=0.25 SENSITIVE Sensitive     GENTAMICIN <=1 SENSITIVE Sensitive     IMIPENEM <=0.25 SENSITIVE Sensitive     NITROFURANTOIN <=16 SENSITIVE Sensitive     TRIMETH/SULFA <=20 SENSITIVE Sensitive     AMPICILLIN/SULBACTAM 4 SENSITIVE Sensitive     PIP/TAZO <=4 SENSITIVE Sensitive ug/mL    * >=100,000 COLONIES/mL KLEBSIELLA PNEUMONIAE  MRSA Next Gen by PCR, Nasal     Status: Abnormal   Collection Time: 05/14/23  4:30 PM   Specimen: Nasal Mucosa; Nasal Swab  Result Value Ref Range Status   MRSA by PCR Next Gen DETECTED (A) NOT DETECTED Final    Comment: RESULT CALLED TO, READ BACK BY AND VERIFIED WITH: DORATHY MUHORO @1908  ON 05/14/23 SKL (NOTE) The GeneXpert MRSA Assay (FDA approved for NASAL specimens only), is one component of a comprehensive MRSA colonization surveillance program. It is not intended to diagnose MRSA infection nor to guide or monitor treatment for MRSA infections. Test performance is not FDA approved in patients less than 4 years old. Performed at [facility], [address] [place], [place] [address]   Culture, blood (Routine X 2) w Reflex to ID Panel     Status: None   Collection Time: 05/22/23  3:04 PM   Specimen: BLOOD RIGHT ARM  Result Value Ref Range Status   Specimen  Description BLOOD RIGHT ARM  Final   Special Requests   Final    BOTTLES DRAWN AEROBIC ONLY Blood Culture results may not be optimal due to an inadequate volume of blood received in culture bottles   Culture   Final    NO GROWTH 5 DAYS Performed at [facility], [address] [place], [place] [address]    Report Status 05/27/2023 FINAL  Final  Culture, blood (Routine X 2) w Reflex to ID Panel     Status: None   Collection Time: 05/22/23  3:04 PM   Specimen: BLOOD RIGHT HAND  Result Value Ref Range Status   Specimen Description BLOOD RIGHT HAND  Final   Special Requests   Final    BOTTLES DRAWN AEROBIC ONLY Blood Culture results may not be optimal due to an inadequate volume of blood received in culture bottles   Culture   Final    NO GROWTH 5 DAYS Performed at [facility], [address] [place], [place] [address]    Report Status 05/27/2023 FINAL  Final    Labs: CBC: Recent Labs  Lab 05/23/23 0402 05/24/23 0412 05/25/23 0941 05/26/23 0526 05/27/23 0559  WBC 21.3* 19.5* 22.3* 21.6* 18.3*  NEUTROABS 12.4* 12.8*  --  14.4* 11.6*  HGB 9.6* 9.9* 10.4* 9.7* 9.9*  HCT 29.7* 31.0* 32.6* 31.0* 31.8*  MCV 83.0 82.9 83.8 83.8 83.9  PLT 453* 552* 623* 653* 564*   Basic Metabolic Panel: Recent Labs  Lab 05/21/23 0636 05/23/23 0402 05/25/23 0941 05/26/23 0811  NA 135 136 133* 130*  K 3.5 3.3* 4.0 4.1  CL 97* 100 99 98  CO2 22 26 23 22   GLUCOSE 95 115* 112* 88  BUN 78* 19 57* 81*  CREATININE 4.45* 0.90 3.77* 4.35*  CALCIUM 9.4 8.6* 9.6 9.4  PHOS 4.7* 2.9  --  6.6*   Liver Function Tests: Recent Labs  Lab 05/21/23 0636 05/23/23 0402 05/26/23 0811  ALBUMIN 2.1* 3.3* 2.4*   CBG: Recent Labs  Lab 05/23/23 1719 05/25/23 1717  GLUCAP 88 111*    Discharge time spent: greater  than 30 minutes.  Signed: Alford Highland, MD [facility] 05/27/2023

## 2023-05-27 NOTE — Plan of Care (Signed)

## 2023-05-27 NOTE — Plan of Care (Signed)

## 2023-05-28 ENCOUNTER — Telehealth: Payer: Self-pay

## 2023-05-28 NOTE — Transitions of Care (Post Inpatient/ED Visit) (Signed)
05/28/2023  Name: Alexandra Foster MRN: [id number] DOB: [birth year]  Today's TOC FU Call Status: Today's TOC FU Call Status:: Successful TOC FU Call Completed TOC FU Call Complete Date: 05/28/23 Patient's Name and Date of Birth confirmed.  Transition Care Management Follow-up Telephone Call Date of Discharge: 05/27/23 Discharge Facility: [facility]) Type of Discharge: Inpatient Admission Primary Inpatient Discharge Diagnosis:: Severe sepsis How have you been since you were released from the hospital?: Better Any questions or concerns?: No  Items Reviewed: Did you receive and understand the discharge instructions provided?: Yes Medications obtained,verified, and reconciled?: Yes (Medications Reviewed) Any new allergies since your discharge?: No Dietary orders reviewed?: Yes Type of Diet Ordered:: Place 1,000 mLs into feeding tube continuous. 45 ml/hour Do you have support at home?: Yes People in Home: child(ren), adult, spouse Name of Support/Comfort Primary Source: Alexandra Foster  Medications Reviewed Today: Medications Reviewed Today     Reviewed by Johnnette Barrios, RN (Registered Nurse) on 05/28/23 at 1039  Med List Status: <None>   Medication Order Taking? Sig Documenting Provider Last Dose Status Informant  acetaminophen (TYLENOL) 325 MG tablet [id number] Yes Place 2 tablets (650 mg total) into feeding tube every 6 (six) hours as needed for mild pain (or Fever >/= 101). Alford Highland, MD Taking Active Child  ascorbic acid (VITAMIN C) 500 MG tablet [id number] Yes Place 0.5 tablets (250 mg total) into feeding tube daily. Delfino Lovett, MD Taking Active Child  atorvastatin (LIPITOR) 20 MG tablet [id number] Yes Place 1 tablet (20 mg total) into feeding tube daily. Mecum, Oswaldo Conroy, PA-C Taking Active Child  cholestyramine (QUESTRAN) 4 g packet [id number] Yes Place 1 packet (4 g total) into feeding tube 3 (three) times daily. Alford Highland, MD Taking Active   clonazePAM (KLONOPIN) 0.5 MG tablet [id number] Yes TAKE 1/2 TO 1 (ONE-HALF TO ONE) TABLET BY MOUTH ONCE DAILY AS NEEDED FOR ANXIETY Carlynn Purl, [name], MD Taking Active Child  escitalopram (LEXAPRO) 10 MG tablet [id number] Yes Place 1 tablet (10 mg total) into feeding tube daily. Alford Highland, MD Taking Active   famotidine (PEPCID) 10 MG tablet [id number] Yes Place 1 tablet (10 mg total) into feeding tube daily. [name], MD Taking Active Child  ferrous sulfate (FER-IN-SOL) 75 (15 Fe) MG/ML SOLN [id number] Yes Place 4 mLs (60 mg of iron total) into feeding tube daily. Alford Highland, MD Taking Active            Med Note (Kyllie Pettijohn L   Thu May 28, 2023 10:39 AM) Will pick up and start today   fludrocortisone (FLORINEF) 0.1 MG tablet [id number] Yes PLACE TWO TABLETS INTO FEEDING TUBE DAILY [name], MD Taking Active Child  glucose blood test strip [id number] Yes Use 1 strip to check blood glucose levels 2 times daily [name], MD Taking Active Child  hydrOXYzine (ATARAX) 50 MG tablet [id number] Yes Take 1 tablet (50 mg total) by mouth 3 (three) times daily as needed for itching. Alford Highland, MD Taking Active   liver oil-zinc oxide (DESITIN) 40 % ointment [id number] Yes Apply topically 3 (three) times daily as needed for irritation. Apply to skin around G tube 3 times daily as needed for skin irritation Alford Highland, MD Taking Active   metoCLOPramide (REGLAN) 5 MG tablet [id number] Yes Place 1 tablet (5 mg total) into feeding tube 4 (four) times daily -  before meals and at bedtime. Alford Highland, MD Taking Active   midodrine (PROAMATINE) 10 MG tablet [id number]  Yes Take 2 tablets (20 mg total) by mouth 3 (three) times daily. Alford Highland, MD Taking Active   multivitamin (RENA-VIT) TABS tablet [id number] Yes PLACE ONE TABLET INTO FEEDING TUBE AT BEDTIME Mecum, Oswaldo Conroy, PA-C Taking Active Child  Nutritional Supplements (FEEDING SUPPLEMENT, NEPRO  CARB STEADY,) LIQD [id number] Yes Place 1,000 mLs into feeding tube continuous. 45 ml/hour Alford Highland, MD Taking Active   nystatin (MYCOSTATIN/NYSTOP) powder [id number] Yes Apply topically 3 (three) times daily. Alford Highland, MD Taking Active   traZODone (DESYREL) 50 MG tablet [id number] Yes Take 0.5 tablets (25 mg total) by mouth at bedtime as needed for sleep. Alford Highland, MD Taking Active   vitamin D3 (CHOLECALCIFEROL) 25 MCG tablet [id number] Yes Place 2 tablets (2,000 Units total) into feeding tube daily. Enedina Finner, MD Taking Active Child  Water For Irrigation, Sterile (FREE WATER) Criss Rosales [id number] Yes Place 30 mLs into feeding tube every 4 (four) hours. Alford Highland, MD Taking Active   Med List Note Sharia Reeve, CPhT 12/06/22 1130): [facility] [phone]          Medication reconciliation / review completed based on most recent discharge summary and EHR medication list. Confirmed patient is taking all newly prescribed medications as instructed (any discrepancies are noted in review section)   Patient / Caregiver is aware of any changes to and / or  any dosage adjustments to medication regimen. Patient/ Caregiver denies questions at this time and reports no barriers to medication adherence.   Home Care and Equipment/Supplies: Were Home Health Services Ordered?: Yes Name of Home Health Agency:: [facility] [phone] Has Agency set up a time to come to your home?: Yes First Home Health Visit Date: 05/28/23 Any new equipment or medical supplies ordered?: Yes Name of Medical supply agency?: [facility] Were you able to get the equipment/medical supplies?: Yes Do you have any questions related to the use of the equipment/supplies?: No  Functional Questionnaire: Do you need assistance with bathing/showering or dressing?: Yes Do you need assistance with meal preparation?: Yes Do you need assistance with eating?: Yes Do you have difficulty maintaining  continence: Yes Do you need assistance with getting out of bed/getting out of a chair/moving?: Yes Do you have difficulty managing or taking your medications?: Yes  Follow up appointments reviewed: PCP Follow-up appointment confirmed?: Yes (PCP will not do virtual visitas, there is limited availability for appointments that require stretcher) Date of PCP follow-up appointment?: 06/22/23 Follow-up Provider: [name] Specialist [facility] Follow-up appointment confirmed?: NA Do you need transportation to your follow-up appointment?: Yes Transportation Need Intervention Addressed By:: Other: (Family uses DSS transport) Do you understand care options if your condition(s) worsen?: Yes-patient verbalized understanding  SDOH Interventions Today    Flowsheet Row Most Recent Value  SDOH Interventions   Food Insecurity Interventions Intervention Not Indicated  Housing Interventions Intervention Not Indicated  Transportation Interventions Intervention Not Indicated, Patient Resources (Friends/Family), Payor Benefit  Utilities Interventions Intervention Not Indicated      Interventions Today    Flowsheet Row Most Recent Value  General Interventions   General Interventions Discussed/Reviewed General Interventions Discussed, General Interventions Reviewed, Durable Medical Equipment (DME), Doctor Visits  Doctor Visits Discussed/Reviewed Doctor Visits Reviewed, PCP, Doctor Visits Discussed  Durable Medical Equipment (DME) BP Cuff, Glucomoter, Wheelchair, Other  [Tube Feeding]  Wheelchair Standard  PCP/Specialist Visits Compliance with follow-up visit  Exercise Interventions   Exercise Discussed/Reviewed Physical Activity  [PROM]  Nutrition Interventions   Nutrition Discussed/Reviewed Nutrition Discussed, Nutrition Reviewed, Supplemental nutrition  Pharmacy Interventions   Pharmacy Dicussed/Reviewed Medications and their functions       Benefits reviewed  Based on current information  and Insurance plan -Reviewed benefits accessible to patient, including details about eligibility options for care and  available value based care options  if any areas of needs were identified.  Reviewed patient/  caregiver's ability to access and / or  ability with navigating the benefits system..Amb Referral made if indicted , refer to orders section of note for details   Reviewed goals for care Patient/ Caregiver  verbalizes understanding of instructions and care plan provided. Patient / Caregiver was encouraged to make informed decisions about their care, actively participate in managing their health condition, and implement lifestyle changes as needed to promote independence and self-management of health care. There were no reported  barriers to care.   TOC program  Patient is at high risk for readmission and/or has history of  high utilization  Discussed VBCI  TOC program and weekly calls to patient to assess condition/status, medication management  and provide support/education as indicated . Patient/ Caregiver voiced understanding and declined enrollment in the 30-day [facility] Program.   She is known to [facility] team. With referral last month to CCM team Visit scheduled 06/03/23 Call should be made to Daughter who is primary Caregiver . She is followed by [facility] with Nursing and therapy services. Dialysis T-TH and Sat.      The patient has been provided with contact information for the care management team and has been advised to call with any health-related questions or concerns. Follow up as indicated with Care Team , or sooner should any new problems arise.      Susa Loffler , BSN, RN [facility]   VBCI-Population Health RN Care Manager Direct Dial [phone]  Fax: [phone] Website: [email or web address]

## 2023-06-01 LAB — CALR +MPL + E12-E15  (REFLEX)

## 2023-06-01 LAB — JAK2 V617F RFX CALR/MPL/E12-15

## 2023-06-01 LAB — BCR-ABL1 FISH
Cells Analyzed: 200
Cells Counted: 200

## 2023-06-01 LAB — COMP PANEL: LEUKEMIA/LYMPHOMA

## 2023-06-03 ENCOUNTER — Ambulatory Visit: Payer: Medicaid Other | Admitting: *Deleted

## 2023-06-03 NOTE — Patient Outreach (Signed)
  Care Coordination   Follow Up Visit Note   06/04/2023 Name: [name] MRN: [id number] DOB: 1950-07-26  Alexandra Foster is a 73 y.o. year old female who sees [name], MD for primary care. I spoke with daughter of  Alexandra Foster by phone today.  What matters to the patients health and wellness today?  Daughter report patient doing well, no concerns since most recent discharge.  Denies any urgent concerns.     Goals Addressed             This Visit's Progress    Effective management of chronic medical conditions   On track    Interventions Today    Flowsheet Row Most Recent Value  Chronic Disease   Chronic disease during today's visit Chronic Kidney Disease/End Stage Renal Disease (ESRD), Congestive Heart Failure (CHF)  General Interventions   General Interventions Discussed/Reviewed General Interventions Reviewed, Doctor Visits, Level of Care  [Daughter report issue with information that was found in her MyChart, provided with the patient advocate information and steps to report a concern.]  Doctor Visits Discussed/Reviewed Doctor Visits Reviewed, PCP  [upcoming PCP 3/10]  PCP/Specialist Visits Compliance with follow-up visit  Level of Care Personal Care Services  [facility] started, but would like to change agencies as her current aide will be changing companies.  Contact information for [facility].]  Education Interventions   Education Provided Provided Education  Provided Verbal Education On When to see the doctor, Medication  [Medications reviewed.  Continue with HD sessions MWF, HH team remains active]              SDOH assessments and interventions completed:  No     Care Coordination Interventions:  Yes, provided   Follow up plan: Follow up call scheduled for 3/12    Encounter Outcome:  Patient Visit Completed   Rodney Langton, RN, MSN, CCM [place]  [facility], [facility] RN Care Coordinator Direct  Dial: [phone] / Main [phone] Fax [phone] Email: [email or web address]: [email or web address]

## 2023-06-03 NOTE — Telephone Encounter (Signed)
Copied from CRM [id number]. Topic: Clinical - Home Health Verbal Orders >> Jun 03, 2023 11:21 AM Shelah Lewandowsky wrote: Caller/Agency: Cindy with [facility] Callback Number: [phone] Service Requested: Physical Therapy Frequency: 1x wk 1 wk, 2x wk for 2 weeks, 1x wk for 5 weeks Any new concerns about the patient? No

## 2023-06-03 NOTE — Telephone Encounter (Signed)
Verbals given  

## 2023-06-04 DIAGNOSIS — I480 Paroxysmal atrial fibrillation: Secondary | ICD-10-CM | POA: Diagnosis not present

## 2023-06-04 NOTE — Patient Instructions (Signed)
Visit Information  Thank you for taking time to visit with me today. Please don't hesitate to contact me if I can be of assistance to you before our next scheduled telephone appointment.  Following are the goals we discussed today:  Look for email with contact information for [facility] concerns and Personal Care Services  Our next appointment is by telephone on 3/12  Please call the care guide team at [phone] if you need to cancel or reschedule your appointment.   Please call the Suicide and Crisis Lifeline: 988 call the [place] National Suicide Prevention Lifeline: 629-148-2267 or TTY: 806-593-8391 TTY 862 187 6267) to talk to a trained counselor call 1-800-273-TALK (toll free, 24 hour hotline) call 911 if you are experiencing a Mental Health or Behavioral Health Crisis or need someone to talk to.  The patient verbalized understanding of instructions, educational materials, and care plan provided today and DECLINED offer to receive copy of patient instructions, educational materials, and care plan.   The patient has been provided with contact information for the care management team and has been advised to call with any health related questions or concerns.   Rodney Langton, RN, MSN, CCM [facility], [facility] RN Care Coordinator Direct Dial: [phone] / Main [phone] Fax [phone] Email: [email or web address] Website: [email or web address]

## 2023-06-06 ENCOUNTER — Other Ambulatory Visit: Payer: Self-pay

## 2023-06-06 ENCOUNTER — Emergency Department: Payer: Medicare Other

## 2023-06-06 ENCOUNTER — Inpatient Hospital Stay
Admission: EM | Admit: 2023-06-06 | Discharge: 2023-06-13 | DRG: 871 | Disposition: E | Payer: Medicare Other | Attending: Osteopathic Medicine | Admitting: Osteopathic Medicine

## 2023-06-06 DIAGNOSIS — Z992 Dependence on renal dialysis: Secondary | ICD-10-CM

## 2023-06-06 DIAGNOSIS — Z931 Gastrostomy status: Secondary | ICD-10-CM

## 2023-06-06 DIAGNOSIS — M1990 Unspecified osteoarthritis, unspecified site: Secondary | ICD-10-CM | POA: Diagnosis present

## 2023-06-06 DIAGNOSIS — Z1624 Resistance to multiple antibiotics: Secondary | ICD-10-CM | POA: Diagnosis present

## 2023-06-06 DIAGNOSIS — K7200 Acute and subacute hepatic failure without coma: Secondary | ICD-10-CM | POA: Diagnosis present

## 2023-06-06 DIAGNOSIS — I4891 Unspecified atrial fibrillation: Secondary | ICD-10-CM | POA: Diagnosis present

## 2023-06-06 DIAGNOSIS — D631 Anemia in chronic kidney disease: Secondary | ICD-10-CM | POA: Diagnosis present

## 2023-06-06 DIAGNOSIS — F32A Depression, unspecified: Secondary | ICD-10-CM | POA: Diagnosis present

## 2023-06-06 DIAGNOSIS — Z803 Family history of malignant neoplasm of breast: Secondary | ICD-10-CM

## 2023-06-06 DIAGNOSIS — I132 Hypertensive heart and chronic kidney disease with heart failure and with stage 5 chronic kidney disease, or end stage renal disease: Secondary | ICD-10-CM | POA: Diagnosis present

## 2023-06-06 DIAGNOSIS — R112 Nausea with vomiting, unspecified: Secondary | ICD-10-CM | POA: Diagnosis present

## 2023-06-06 DIAGNOSIS — G4733 Obstructive sleep apnea (adult) (pediatric): Secondary | ICD-10-CM | POA: Diagnosis present

## 2023-06-06 DIAGNOSIS — R791 Abnormal coagulation profile: Secondary | ICD-10-CM | POA: Diagnosis present

## 2023-06-06 DIAGNOSIS — N186 End stage renal disease: Secondary | ICD-10-CM | POA: Diagnosis not present

## 2023-06-06 DIAGNOSIS — I9589 Other hypotension: Secondary | ICD-10-CM | POA: Diagnosis present

## 2023-06-06 DIAGNOSIS — R6520 Severe sepsis without septic shock: Secondary | ICD-10-CM | POA: Diagnosis present

## 2023-06-06 DIAGNOSIS — G9341 Metabolic encephalopathy: Secondary | ICD-10-CM | POA: Diagnosis present

## 2023-06-06 DIAGNOSIS — J690 Pneumonitis due to inhalation of food and vomit: Secondary | ICD-10-CM | POA: Diagnosis present

## 2023-06-06 DIAGNOSIS — J189 Pneumonia, unspecified organism: Principal | ICD-10-CM | POA: Diagnosis present

## 2023-06-06 DIAGNOSIS — A4159 Other Gram-negative sepsis: Principal | ICD-10-CM | POA: Diagnosis present

## 2023-06-06 DIAGNOSIS — N390 Urinary tract infection, site not specified: Secondary | ICD-10-CM | POA: Diagnosis present

## 2023-06-06 DIAGNOSIS — I480 Paroxysmal atrial fibrillation: Secondary | ICD-10-CM | POA: Diagnosis present

## 2023-06-06 DIAGNOSIS — I4510 Unspecified right bundle-branch block: Secondary | ICD-10-CM | POA: Diagnosis present

## 2023-06-06 DIAGNOSIS — Z89612 Acquired absence of left leg above knee: Secondary | ICD-10-CM

## 2023-06-06 DIAGNOSIS — E872 Acidosis, unspecified: Secondary | ICD-10-CM | POA: Diagnosis present

## 2023-06-06 DIAGNOSIS — J449 Chronic obstructive pulmonary disease, unspecified: Secondary | ICD-10-CM | POA: Diagnosis present

## 2023-06-06 DIAGNOSIS — Z8619 Personal history of other infectious and parasitic diseases: Secondary | ICD-10-CM

## 2023-06-06 DIAGNOSIS — I5032 Chronic diastolic (congestive) heart failure: Secondary | ICD-10-CM | POA: Diagnosis present

## 2023-06-06 DIAGNOSIS — F419 Anxiety disorder, unspecified: Secondary | ICD-10-CM | POA: Diagnosis present

## 2023-06-06 DIAGNOSIS — Z66 Do not resuscitate: Secondary | ICD-10-CM | POA: Diagnosis present

## 2023-06-06 DIAGNOSIS — E1122 Type 2 diabetes mellitus with diabetic chronic kidney disease: Secondary | ICD-10-CM | POA: Diagnosis present

## 2023-06-06 DIAGNOSIS — Z79899 Other long term (current) drug therapy: Secondary | ICD-10-CM

## 2023-06-06 DIAGNOSIS — Z1152 Encounter for screening for COVID-19: Secondary | ICD-10-CM

## 2023-06-06 DIAGNOSIS — Z90710 Acquired absence of both cervix and uterus: Secondary | ICD-10-CM

## 2023-06-06 DIAGNOSIS — E875 Hyperkalemia: Secondary | ICD-10-CM | POA: Diagnosis not present

## 2023-06-06 DIAGNOSIS — D849 Immunodeficiency, unspecified: Secondary | ICD-10-CM | POA: Diagnosis present

## 2023-06-06 DIAGNOSIS — E2740 Unspecified adrenocortical insufficiency: Secondary | ICD-10-CM | POA: Diagnosis present

## 2023-06-06 DIAGNOSIS — Z7952 Long term (current) use of systemic steroids: Secondary | ICD-10-CM

## 2023-06-06 DIAGNOSIS — R0602 Shortness of breath: Secondary | ICD-10-CM | POA: Diagnosis not present

## 2023-06-06 DIAGNOSIS — E785 Hyperlipidemia, unspecified: Secondary | ICD-10-CM | POA: Diagnosis present

## 2023-06-06 DIAGNOSIS — Z888 Allergy status to other drugs, medicaments and biological substances status: Secondary | ICD-10-CM

## 2023-06-06 DIAGNOSIS — Z8679 Personal history of other diseases of the circulatory system: Secondary | ICD-10-CM

## 2023-06-06 DIAGNOSIS — I2510 Atherosclerotic heart disease of native coronary artery without angina pectoris: Secondary | ICD-10-CM | POA: Diagnosis present

## 2023-06-06 DIAGNOSIS — K219 Gastro-esophageal reflux disease without esophagitis: Secondary | ICD-10-CM | POA: Diagnosis present

## 2023-06-06 DIAGNOSIS — Z883 Allergy status to other anti-infective agents status: Secondary | ICD-10-CM

## 2023-06-06 DIAGNOSIS — Z811 Family history of alcohol abuse and dependence: Secondary | ICD-10-CM

## 2023-06-06 DIAGNOSIS — L89152 Pressure ulcer of sacral region, stage 2: Secondary | ICD-10-CM | POA: Diagnosis present

## 2023-06-06 DIAGNOSIS — Z720 Tobacco use: Secondary | ICD-10-CM

## 2023-06-06 DIAGNOSIS — L8992 Pressure ulcer of unspecified site, stage 2: Secondary | ICD-10-CM | POA: Diagnosis present

## 2023-06-06 DIAGNOSIS — R5381 Other malaise: Secondary | ICD-10-CM | POA: Diagnosis present

## 2023-06-06 DIAGNOSIS — Z87898 Personal history of other specified conditions: Secondary | ICD-10-CM

## 2023-06-06 DIAGNOSIS — K7460 Unspecified cirrhosis of liver: Secondary | ICD-10-CM | POA: Diagnosis present

## 2023-06-06 DIAGNOSIS — A419 Sepsis, unspecified organism: Secondary | ICD-10-CM | POA: Diagnosis present

## 2023-06-06 DIAGNOSIS — Z881 Allergy status to other antibiotic agents status: Secondary | ICD-10-CM

## 2023-06-06 DIAGNOSIS — J440 Chronic obstructive pulmonary disease with acute lower respiratory infection: Secondary | ICD-10-CM | POA: Diagnosis present

## 2023-06-06 DIAGNOSIS — L89153 Pressure ulcer of sacral region, stage 3: Secondary | ICD-10-CM | POA: Diagnosis present

## 2023-06-06 DIAGNOSIS — I4892 Unspecified atrial flutter: Secondary | ICD-10-CM | POA: Diagnosis present

## 2023-06-06 DIAGNOSIS — Z8249 Family history of ischemic heart disease and other diseases of the circulatory system: Secondary | ICD-10-CM

## 2023-06-06 DIAGNOSIS — J9602 Acute respiratory failure with hypercapnia: Secondary | ICD-10-CM | POA: Diagnosis present

## 2023-06-06 DIAGNOSIS — R6521 Severe sepsis with septic shock: Secondary | ICD-10-CM | POA: Diagnosis present

## 2023-06-06 DIAGNOSIS — Z880 Allergy status to penicillin: Secondary | ICD-10-CM

## 2023-06-06 DIAGNOSIS — J9601 Acute respiratory failure with hypoxia: Secondary | ICD-10-CM | POA: Diagnosis present

## 2023-06-06 DIAGNOSIS — Z515 Encounter for palliative care: Secondary | ICD-10-CM

## 2023-06-06 DIAGNOSIS — N2581 Secondary hyperparathyroidism of renal origin: Secondary | ICD-10-CM | POA: Diagnosis present

## 2023-06-06 DIAGNOSIS — Y95 Nosocomial condition: Secondary | ICD-10-CM | POA: Diagnosis present

## 2023-06-06 LAB — URINALYSIS, W/ REFLEX TO CULTURE (INFECTION SUSPECTED)
Bilirubin Urine: NEGATIVE
Glucose, UA: NEGATIVE mg/dL
Hgb urine dipstick: NEGATIVE
Ketones, ur: NEGATIVE mg/dL
Nitrite: NEGATIVE
Protein, ur: 100 mg/dL — AB
Specific Gravity, Urine: 1.016 (ref 1.005–1.030)
WBC, UA: >50 WBC/hpf (ref 0–5)
pH: 5 (ref 5.0–8.0)

## 2023-06-06 LAB — COMPREHENSIVE METABOLIC PANEL
ALT: 39 U/L (ref 0–44)
AST: 90 U/L — ABNORMAL HIGH (ref 15–41)
Albumin: 2.9 g/dL — ABNORMAL LOW (ref 3.5–5.0)
Alkaline Phosphatase: 118 U/L (ref 38–126)
Anion gap: 19 — ABNORMAL HIGH (ref 5–15)
BUN: 39 mg/dL — ABNORMAL HIGH (ref 8–23)
CO2: 16 mmol/L — ABNORMAL LOW (ref 22–32)
Calcium: 9.3 mg/dL (ref 8.9–10.3)
Chloride: 100 mmol/L (ref 98–111)
Creatinine, Ser: 4.38 mg/dL — ABNORMAL HIGH (ref 0.44–1.00)
GFR, Estimated: 10 mL/min — ABNORMAL LOW (ref >60)
Glucose, Bld: 151 mg/dL — ABNORMAL HIGH (ref 70–99)
Potassium: 4.5 mmol/L (ref 3.5–5.1)
Sodium: 135 mmol/L (ref 135–145)
Total Bilirubin: 1.3 mg/dL — ABNORMAL HIGH (ref 0.0–1.2)
Total Protein: 7.7 g/dL (ref 6.5–8.1)

## 2023-06-06 LAB — RESP PANEL BY RT-PCR (RSV, FLU A&B, COVID)  RVPGX2
Influenza A by PCR: NEGATIVE
Influenza B by PCR: NEGATIVE
Resp Syncytial Virus by PCR: NEGATIVE
SARS Coronavirus 2 by RT PCR: NEGATIVE

## 2023-06-06 LAB — CBC WITH DIFFERENTIAL/PLATELET
Abs Immature Granulocytes: 0.06 10*3/uL (ref 0.00–0.07)
Basophils Absolute: 0 10*3/uL (ref 0.0–0.1)
Basophils Relative: 0 %
Eosinophils Absolute: 0 10*3/uL (ref 0.0–0.5)
Eosinophils Relative: 0 %
HCT: 44.7 % (ref 36.0–46.0)
Hemoglobin: 13.4 g/dL (ref 12.0–15.0)
Immature Granulocytes: 1 %
Lymphocytes Relative: 7 %
Lymphs Abs: 0.7 10*3/uL (ref 0.7–4.0)
MCH: 26.4 pg (ref 26.0–34.0)
MCHC: 30 g/dL (ref 30.0–36.0)
MCV: 88.2 fL (ref 80.0–100.0)
Monocytes Absolute: 0.6 10*3/uL (ref 0.1–1.0)
Monocytes Relative: 7 %
Neutro Abs: 7.5 10*3/uL (ref 1.7–7.7)
Neutrophils Relative %: 85 %
Platelets: 317 10*3/uL (ref 150–400)
RBC: 5.07 MIL/uL (ref 3.87–5.11)
RDW: 18.7 % — ABNORMAL HIGH (ref 11.5–15.5)
WBC: 8.9 10*3/uL (ref 4.0–10.5)
nRBC: 0.8 % — ABNORMAL HIGH (ref 0.0–0.2)

## 2023-06-06 LAB — TROPONIN I (HIGH SENSITIVITY): Troponin I (High Sensitivity): 11 ng/L (ref <18)

## 2023-06-06 LAB — APTT: aPTT: 26 s (ref 24–36)

## 2023-06-06 LAB — PROTIME-INR
INR: 1.1 (ref 0.8–1.2)
Prothrombin Time: 14.7 s (ref 11.4–15.2)

## 2023-06-06 LAB — BRAIN NATRIURETIC PEPTIDE: B Natriuretic Peptide: 59.3 pg/mL (ref 0.0–100.0)

## 2023-06-06 LAB — LACTIC ACID, PLASMA: Lactic Acid, Venous: 8 mmol/L (ref 0.5–1.9)

## 2023-06-06 LAB — LIPASE, BLOOD: Lipase: 53 U/L — ABNORMAL HIGH (ref 11–51)

## 2023-06-06 MED ORDER — SODIUM CHLORIDE 0.9 % IV BOLUS (SEPSIS): 1000.0000 mL | Freq: Once | INTRAVENOUS | Status: DC

## 2023-06-06 MED ORDER — SODIUM CHLORIDE 0.9 % IV BOLUS (SEPSIS)
1000.0000 mL | Freq: Once | INTRAVENOUS | Status: AC
  Administered 2023-06-06: 1000 mL via INTRAVENOUS

## 2023-06-06 MED ORDER — SODIUM CHLORIDE 0.9 % IV SOLN
2.0000 g | Freq: Once | INTRAVENOUS | Status: AC
  Administered 2023-06-06: 2 g via INTRAVENOUS
  Filled 2023-06-06: qty 12.5

## 2023-06-06 MED ORDER — SODIUM CHLORIDE 0.9 % IV BOLUS (SEPSIS): 500.0000 mL | Freq: Once | INTRAVENOUS | Status: DC

## 2023-06-06 MED ORDER — ONDANSETRON HCL 4 MG/2ML IJ SOLN
4.0000 mg | Freq: Once | INTRAMUSCULAR | Status: AC
  Administered 2023-06-06: 4 mg via INTRAVENOUS
  Filled 2023-06-06: qty 2

## 2023-06-06 NOTE — ED Provider Notes (Signed)
 [facility] Provider Note    Event Date/Time   First MD Initiated Contact with Patient 06/06/23 2213     (approximate)   History   Chief Complaint: Altered Mental Status   HPI  Alexandra Foster is a 73 y.o. female with a history of COPD, paroxysmal atrial fibrillation, hypertension, diabetes, ESRD on hemodialysis who is brought to the ED due to altered mental status, respiratory distress.  Patient complains of shortness of breath, cough, and chronic itching of her back          Physical Exam   Triage Vital Signs: ED Triage Vitals  Encounter Vitals Group     BP 06/06/23 2148 (!) 156/68     Systolic BP Percentile --      Diastolic BP Percentile --      Pulse Rate 06/06/23 2148 (!) 106     Resp 06/06/23 2148 (!) 35     Temp 06/06/23 2149 (!) 101.8 F (38.8 C)     Temp Source 06/06/23 2149 Rectal     SpO2 06/06/23 2148 100 %     Weight --      Height --      Head Circumference --      Peak Flow --      Pain Score 06/06/23 2151 8     Pain Loc --      Pain Education --      Exclude from Growth Chart --     Most recent vital signs: Vitals:   06/06/23 2330 06/07/23 0000  BP: (!) 104/29 (!) 108/92  Pulse:  98  Resp: 19 (!) 23  Temp:    SpO2:  99%    General: Awake, respiratory distress CV:  Good peripheral perfusion.  Tachycardia heart rate 105 Resp:  Normal effort.  Tachypnea.  Bilateral crackles. Abd:  No distention.  Soft nontender Other:  No lower extremity edema.  Dry oral mucosa.   ED Results / Procedures / Treatments   Labs (all labs ordered are listed, but only abnormal results are displayed) Labs Reviewed  LACTIC ACID, PLASMA - Abnormal; Notable for the following components:      Result Value   Lactic Acid, Venous 8.0 (*)    All other components within normal limits  CBC WITH DIFFERENTIAL/PLATELET - Abnormal; Notable for the following components:   RDW 18.7 (*)    nRBC 0.8 (*)    All other components within  normal limits  URINALYSIS, W/ REFLEX TO CULTURE (INFECTION SUSPECTED) - Abnormal; Notable for the following components:   Color, Urine AMBER (*)    APPearance TURBID (*)    Protein, ur 100 (*)    Leukocytes,Ua MODERATE (*)    Bacteria, UA RARE (*)    All other components within normal limits  COMPREHENSIVE METABOLIC PANEL - Abnormal; Notable for the following components:   CO2 16 (*)    Glucose, Bld 151 (*)    BUN 39 (*)    Creatinine, Ser 4.38 (*)    Albumin 2.9 (*)    AST 90 (*)    Total Bilirubin 1.3 (*)    GFR, Estimated 10 (*)    Anion gap 19 (*)    All other components within normal limits  LIPASE, BLOOD - Abnormal; Notable for the following components:   Lipase 53 (*)    All other components within normal limits  RESP PANEL BY RT-PCR (RSV, FLU A&B, COVID)  RVPGX2  CULTURE, BLOOD (ROUTINE X 2)  CULTURE,  BLOOD (ROUTINE X 2)  RESP PANEL BY RT-PCR (RSV, FLU A&B, COVID)  RVPGX2  URINE CULTURE  PROTIME-INR  APTT  BRAIN NATRIURETIC PEPTIDE  PROCALCITONIN  LACTIC ACID, PLASMA  TROPONIN I (HIGH SENSITIVITY)  TROPONIN I (HIGH SENSITIVITY)     EKG Interpreted by me Sinus tachycardia rate 102.  Normal axis, normal intervals.  Right bundle branch block.  No acute ischemic changes.   RADIOLOGY Chest x-ray interpreted by me, shows multifocal pneumonia.  Radiology report reviewed   PROCEDURES:  .Critical Care  Performed by: Sharman Cheek, MD Authorized by: Sharman Cheek, MD   Critical care provider statement:    Critical care time (minutes):  35   Critical care time was exclusive of:  Separately billable procedures and treating other patients   Critical care was necessary to treat or prevent imminent or life-threatening deterioration of the following conditions:  Sepsis, respiratory failure and shock   Critical care was time spent personally by me on the following activities:  Development of treatment plan with patient or surrogate, discussions with consultants,  evaluation of patient's response to treatment, examination of patient, obtaining history from patient or surrogate, ordering and performing treatments and interventions, ordering and review of laboratory studies, ordering and review of radiographic studies, pulse oximetry, re-evaluation of patient's condition and review of old charts   Care discussed with: admitting provider      MEDICATIONS ORDERED IN ED: Medications  ondansetron (ZOFRAN) injection 4 mg (4 mg Intravenous Given 06/06/23 2223)  sodium chloride 0.9 % bolus 1,000 mL (1,000 mLs Intravenous New Bag/Given 06/06/23 2223)  ceFEPIme (MAXIPIME) 2 g in sodium chloride 0.9 % 100 mL IVPB (0 g Intravenous Stopped 06/06/23 2258)  diphenhydrAMINE (BENADRYL) injection 12.5 mg (12.5 mg Intravenous Given 06/07/23 0010)     IMPRESSION / MDM / ASSESSMENT AND PLAN / ED COURSE  I reviewed the triage vital signs and the nursing notes.  DDx: Pneumonia, pulm edema, pleural effusion, UTI, sepsis, electrolyte derangement, influenza, COVID  Patient's presentation is most consistent with acute presentation with potential threat to life or bodily function.       Clinical Course as of 06/07/23 0122  Sat Jun 06, 2023  2217 P/w resp distress, suspected sepsis. Will give empiric cefepime, bipap trial.  [PS]  Sun Jun 07, 2023  0001 Lactate 8.0. will give additional IVF bolus for septic shock. I'm worried about high risk of volume overload and pulm edema due to pt's ESRD and HD needs. I don't think a full 8ml/kg bolus of is worth the risk of necessitating intubation since pt has not had hypotension. [PS]    Clinical Course User Index [PS] Sharman Cheek, MD     FINAL CLINICAL IMPRESSION(S) / ED DIAGNOSES   Final diagnoses:  HCAP (healthcare-associated pneumonia)  Severe sepsis (HCC)  ESRD on hemodialysis (HCC)     Rx / DC Orders   ED Discharge Orders     None        Note:  This document was prepared using Dragon voice  recognition software and may include unintentional dictation errors.   Sharman Cheek, MD 06/07/23 [phone]

## 2023-06-06 NOTE — ED Notes (Signed)
 Lac 8.0 provider aware. Pt and daughter requesting benadryl. Provider aware

## 2023-06-06 NOTE — Progress Notes (Signed)
 CODE SEPSIS - PHARMACY COMMUNICATION  **Broad Spectrum Antibiotics should be administered within 1 hour of Sepsis diagnosis**  Time Code Sepsis Called/Page Received: 2219  Antibiotics Ordered: Cefepime  Time of 1st antibiotic administration: 2228  Otelia Sergeant, PharmD, [facility] 06/06/2023 10:22 PM

## 2023-06-06 NOTE — ED Notes (Signed)
Pt on bipap

## 2023-06-06 NOTE — Sepsis Progress Note (Signed)
 Elink following code sepsis

## 2023-06-06 NOTE — ED Notes (Signed)
 RT called for bipap

## 2023-06-06 NOTE — ED Triage Notes (Signed)
 BIBA for AMS, pt recent admit for UTI/pneum. Pmh COPD/DM.

## 2023-06-07 ENCOUNTER — Encounter: Payer: Self-pay | Admitting: Internal Medicine

## 2023-06-07 ENCOUNTER — Inpatient Hospital Stay: Payer: Medicare Other

## 2023-06-07 DIAGNOSIS — Z992 Dependence on renal dialysis: Secondary | ICD-10-CM | POA: Diagnosis not present

## 2023-06-07 DIAGNOSIS — Z66 Do not resuscitate: Secondary | ICD-10-CM | POA: Diagnosis present

## 2023-06-07 DIAGNOSIS — I4892 Unspecified atrial flutter: Secondary | ICD-10-CM

## 2023-06-07 DIAGNOSIS — J9601 Acute respiratory failure with hypoxia: Secondary | ICD-10-CM | POA: Diagnosis present

## 2023-06-07 DIAGNOSIS — I4891 Unspecified atrial fibrillation: Secondary | ICD-10-CM

## 2023-06-07 DIAGNOSIS — R6520 Severe sepsis without septic shock: Secondary | ICD-10-CM

## 2023-06-07 DIAGNOSIS — K7200 Acute and subacute hepatic failure without coma: Secondary | ICD-10-CM | POA: Diagnosis present

## 2023-06-07 DIAGNOSIS — G4733 Obstructive sleep apnea (adult) (pediatric): Secondary | ICD-10-CM

## 2023-06-07 DIAGNOSIS — J690 Pneumonitis due to inhalation of food and vomit: Secondary | ICD-10-CM | POA: Diagnosis present

## 2023-06-07 DIAGNOSIS — G9341 Metabolic encephalopathy: Secondary | ICD-10-CM

## 2023-06-07 DIAGNOSIS — N390 Urinary tract infection, site not specified: Secondary | ICD-10-CM | POA: Diagnosis present

## 2023-06-07 DIAGNOSIS — J440 Chronic obstructive pulmonary disease with acute lower respiratory infection: Secondary | ICD-10-CM | POA: Diagnosis present

## 2023-06-07 DIAGNOSIS — Z515 Encounter for palliative care: Secondary | ICD-10-CM | POA: Diagnosis not present

## 2023-06-07 DIAGNOSIS — A4159 Other Gram-negative sepsis: Secondary | ICD-10-CM | POA: Diagnosis present

## 2023-06-07 DIAGNOSIS — J449 Chronic obstructive pulmonary disease, unspecified: Secondary | ICD-10-CM

## 2023-06-07 DIAGNOSIS — Y95 Nosocomial condition: Secondary | ICD-10-CM | POA: Diagnosis present

## 2023-06-07 DIAGNOSIS — I5032 Chronic diastolic (congestive) heart failure: Secondary | ICD-10-CM | POA: Diagnosis present

## 2023-06-07 DIAGNOSIS — F32A Depression, unspecified: Secondary | ICD-10-CM | POA: Diagnosis present

## 2023-06-07 DIAGNOSIS — J189 Pneumonia, unspecified organism: Secondary | ICD-10-CM | POA: Diagnosis present

## 2023-06-07 DIAGNOSIS — E1122 Type 2 diabetes mellitus with diabetic chronic kidney disease: Secondary | ICD-10-CM | POA: Diagnosis present

## 2023-06-07 DIAGNOSIS — D849 Immunodeficiency, unspecified: Secondary | ICD-10-CM | POA: Diagnosis present

## 2023-06-07 DIAGNOSIS — Z931 Gastrostomy status: Secondary | ICD-10-CM

## 2023-06-07 DIAGNOSIS — I132 Hypertensive heart and chronic kidney disease with heart failure and with stage 5 chronic kidney disease, or end stage renal disease: Secondary | ICD-10-CM | POA: Diagnosis present

## 2023-06-07 DIAGNOSIS — N186 End stage renal disease: Secondary | ICD-10-CM | POA: Diagnosis present

## 2023-06-07 DIAGNOSIS — E2740 Unspecified adrenocortical insufficiency: Secondary | ICD-10-CM

## 2023-06-07 DIAGNOSIS — R112 Nausea with vomiting, unspecified: Secondary | ICD-10-CM | POA: Diagnosis present

## 2023-06-07 DIAGNOSIS — J9602 Acute respiratory failure with hypercapnia: Secondary | ICD-10-CM | POA: Diagnosis present

## 2023-06-07 DIAGNOSIS — F419 Anxiety disorder, unspecified: Secondary | ICD-10-CM

## 2023-06-07 DIAGNOSIS — D631 Anemia in chronic kidney disease: Secondary | ICD-10-CM | POA: Diagnosis present

## 2023-06-07 DIAGNOSIS — K7460 Unspecified cirrhosis of liver: Secondary | ICD-10-CM | POA: Diagnosis present

## 2023-06-07 DIAGNOSIS — A419 Sepsis, unspecified organism: Secondary | ICD-10-CM | POA: Diagnosis not present

## 2023-06-07 DIAGNOSIS — R6521 Severe sepsis with septic shock: Secondary | ICD-10-CM | POA: Diagnosis present

## 2023-06-07 DIAGNOSIS — E872 Acidosis, unspecified: Secondary | ICD-10-CM | POA: Diagnosis present

## 2023-06-07 DIAGNOSIS — E785 Hyperlipidemia, unspecified: Secondary | ICD-10-CM

## 2023-06-07 DIAGNOSIS — Z1152 Encounter for screening for COVID-19: Secondary | ICD-10-CM | POA: Diagnosis not present

## 2023-06-07 DIAGNOSIS — L89153 Pressure ulcer of sacral region, stage 3: Secondary | ICD-10-CM | POA: Diagnosis present

## 2023-06-07 LAB — COMPREHENSIVE METABOLIC PANEL
ALT: 39 U/L (ref 0–44)
AST: 71 U/L — ABNORMAL HIGH (ref 15–41)
Albumin: 2.5 g/dL — ABNORMAL LOW (ref 3.5–5.0)
Alkaline Phosphatase: 75 U/L (ref 38–126)
Anion gap: 18 — ABNORMAL HIGH (ref 5–15)
BUN: 42 mg/dL — ABNORMAL HIGH (ref 8–23)
CO2: 12 mmol/L — ABNORMAL LOW (ref 22–32)
Calcium: 8.4 mg/dL — ABNORMAL LOW (ref 8.9–10.3)
Chloride: 103 mmol/L (ref 98–111)
Creatinine, Ser: 4.52 mg/dL — ABNORMAL HIGH (ref 0.44–1.00)
GFR, Estimated: 10 mL/min — ABNORMAL LOW (ref >60)
Glucose, Bld: 132 mg/dL — ABNORMAL HIGH (ref 70–99)
Potassium: 5.2 mmol/L — ABNORMAL HIGH (ref 3.5–5.1)
Sodium: 133 mmol/L — ABNORMAL LOW (ref 135–145)
Total Bilirubin: 0.6 mg/dL (ref 0.0–1.2)
Total Protein: 6.8 g/dL (ref 6.5–8.1)

## 2023-06-07 LAB — LACTIC ACID, PLASMA
Lactic Acid, Venous: 7.2 mmol/L (ref 0.5–1.9)
Lactic Acid, Venous: 8 mmol/L (ref 0.5–1.9)
Lactic Acid, Venous: 7.4 mmol/L (ref 0.5–1.9)

## 2023-06-07 LAB — RESPIRATORY PANEL BY PCR

## 2023-06-07 LAB — CBC
HCT: 38.3 % (ref 36.0–46.0)
Hemoglobin: 11.3 g/dL — ABNORMAL LOW (ref 12.0–15.0)
MCH: 26.3 pg (ref 26.0–34.0)
MCHC: 29.5 g/dL — ABNORMAL LOW (ref 30.0–36.0)
MCV: 89.3 fL (ref 80.0–100.0)
Platelets: 246 10*3/uL (ref 150–400)
RBC: 4.29 MIL/uL (ref 3.87–5.11)
RDW: 17.9 % — ABNORMAL HIGH (ref 11.5–15.5)
WBC: 11.2 10*3/uL — ABNORMAL HIGH (ref 4.0–10.5)
nRBC: 0.3 % — ABNORMAL HIGH (ref 0.0–0.2)

## 2023-06-07 LAB — BLOOD GAS, VENOUS
Acid-base deficit: 13.1 mmol/L — ABNORMAL HIGH (ref 0.0–2.0)
Bicarbonate: 16.5 mmol/L — ABNORMAL LOW (ref 20.0–28.0)
O2 Saturation: 53.5 %
Patient temperature: 37
pCO2, Ven: 52 mm[Hg] (ref 44–60)
pH, Ven: 7.11 — CL (ref 7.25–7.43)
pO2, Ven: 37 mm[Hg] (ref 32–45)

## 2023-06-07 LAB — MRSA NEXT GEN BY PCR, NASAL: MRSA by PCR Next Gen: NOT DETECTED

## 2023-06-07 LAB — GLUCOSE, CAPILLARY
Glucose-Capillary: 139 mg/dL — ABNORMAL HIGH (ref 70–99)
Glucose-Capillary: 154 mg/dL — ABNORMAL HIGH (ref 70–99)
Glucose-Capillary: 127 mg/dL — ABNORMAL HIGH (ref 70–99)

## 2023-06-07 LAB — PROCALCITONIN: Procalcitonin: 32.51 ng/mL

## 2023-06-07 LAB — RESP PANEL BY RT-PCR (RSV, FLU A&B, COVID)  RVPGX2
Influenza A by PCR: NEGATIVE
Influenza B by PCR: NEGATIVE
Resp Syncytial Virus by PCR: NEGATIVE
SARS Coronavirus 2 by RT PCR: NEGATIVE

## 2023-06-07 LAB — TROPONIN I (HIGH SENSITIVITY): Troponin I (High Sensitivity): 13 ng/L (ref <18)

## 2023-06-07 MED ORDER — ALBUMIN HUMAN 25 % IV SOLN
25.0000 g | Freq: Once | INTRAVENOUS | Status: AC
  Administered 2023-06-07: 25 g via INTRAVENOUS
  Filled 2023-06-07: qty 100

## 2023-06-07 MED ORDER — DM-GUAIFENESIN ER 30-600 MG PO TB12: 1.0000 | ORAL_TABLET | Freq: Two times a day (BID) | ORAL | Status: DC | PRN

## 2023-06-07 MED ORDER — FERROUS SULFATE 75 (15 FE) MG/ML PO SOLN
60.0000 mg | Freq: Every day | ORAL | Status: DC
  Administered 2023-06-07: 60 mg
  Filled 2023-06-07 (×2): qty 4

## 2023-06-07 MED ORDER — PHENOL 1.4 % MT LIQD
1.0000 | OROMUCOSAL | Status: DC | PRN
  Administered 2023-06-07: 1 via OROMUCOSAL
  Filled 2023-06-07: qty 177

## 2023-06-07 MED ORDER — LINEZOLID 600 MG/300ML IV SOLN
600.0000 mg | Freq: Two times a day (BID) | INTRAVENOUS | Status: DC
  Administered 2023-06-07 (×3): 600 mg via INTRAVENOUS
  Filled 2023-06-07 (×4): qty 300

## 2023-06-07 MED ORDER — SODIUM CHLORIDE 0.9 % IV BOLUS: 500.0000 mL | Freq: Once | INTRAVENOUS | Status: DC

## 2023-06-07 MED ORDER — HYDROCORTISONE SOD SUC (PF) 100 MG IJ SOLR
100.0000 mg | Freq: Once | INTRAMUSCULAR | Status: DC
  Filled 2023-06-07 (×2): qty 2

## 2023-06-07 MED ORDER — RENA-VITE PO TABS
1.0000 | ORAL_TABLET | Freq: Every day | ORAL | Status: DC
  Administered 2023-06-07: 1
  Filled 2023-06-07 (×2): qty 1

## 2023-06-07 MED ORDER — HYDROXYZINE HCL 25 MG PO TABS
50.0000 mg | ORAL_TABLET | Freq: Three times a day (TID) | ORAL | Status: DC | PRN
  Administered 2023-06-07 – 2023-06-08 (×2): 50 mg via ORAL
  Filled 2023-06-07 (×2): qty 2

## 2023-06-07 MED ORDER — STERILE WATER FOR INJECTION IV SOLN
INTRAVENOUS | Status: DC
  Filled 2023-06-07: qty 1000

## 2023-06-07 MED ORDER — FUROSEMIDE 10 MG/ML IJ SOLN
80.0000 mg | Freq: Once | INTRAMUSCULAR | Status: AC
  Administered 2023-06-07: 80 mg via INTRAVENOUS
  Filled 2023-06-07: qty 8

## 2023-06-07 MED ORDER — NYSTATIN 100000 UNIT/GM EX POWD
Freq: Two times a day (BID) | CUTANEOUS | Status: DC
  Filled 2023-06-07: qty 15

## 2023-06-07 MED ORDER — IPRATROPIUM-ALBUTEROL 0.5-2.5 (3) MG/3ML IN SOLN
3.0000 mL | Freq: Four times a day (QID) | RESPIRATORY_TRACT | Status: DC
  Administered 2023-06-07 – 2023-06-08 (×4): 3 mL via RESPIRATORY_TRACT
  Filled 2023-06-07 (×5): qty 3

## 2023-06-07 MED ORDER — ALTEPLASE 2 MG IJ SOLR
INTRAMUSCULAR | Status: AC
  Filled 2023-06-07: qty 2

## 2023-06-07 MED ORDER — ORAL CARE MOUTH RINSE: 15.0000 mL | OROMUCOSAL | Status: DC | PRN

## 2023-06-07 MED ORDER — ALTEPLASE 2 MG IJ SOLR
2.0000 mg | Freq: Once | INTRAMUSCULAR | Status: AC | PRN
  Administered 2023-06-07: 2 mg
  Filled 2023-06-07: qty 2

## 2023-06-07 MED ORDER — SODIUM CHLORIDE 0.9 % IV SOLN: INTRAVENOUS | Status: DC

## 2023-06-07 MED ORDER — ALTEPLASE 2 MG IJ SOLR
2.0000 mg | Freq: Once | INTRAMUSCULAR | Status: AC | PRN
Start: 2023-06-07 — End: 2023-06-07
  Administered 2023-06-07: 2 mg

## 2023-06-07 MED ORDER — CLONAZEPAM 0.5 MG PO TABS
0.5000 mg | ORAL_TABLET | Freq: Two times a day (BID) | ORAL | Status: DC | PRN
  Administered 2023-06-08: 0.5 mg via ORAL
  Filled 2023-06-07: qty 1

## 2023-06-07 MED ORDER — VITAMIN C 500 MG PO TABS
250.0000 mg | ORAL_TABLET | Freq: Every day | ORAL | Status: DC
  Administered 2023-06-07: 250 mg
  Filled 2023-06-07: qty 1

## 2023-06-07 MED ORDER — ESCITALOPRAM OXALATE 10 MG PO TABS
10.0000 mg | ORAL_TABLET | Freq: Every day | ORAL | Status: DC
  Administered 2023-06-07: 10 mg
  Filled 2023-06-07: qty 1

## 2023-06-07 MED ORDER — HYDROCORTISONE SOD SUC (PF) 100 MG IJ SOLR
100.0000 mg | Freq: Once | INTRAMUSCULAR | Status: AC
  Administered 2023-06-07: 100 mg via INTRAVENOUS
  Filled 2023-06-07: qty 2

## 2023-06-07 MED ORDER — SODIUM CHLORIDE 0.9% FLUSH: 10.0000 mL | INTRAVENOUS | Status: DC | PRN

## 2023-06-07 MED ORDER — METRONIDAZOLE 500 MG/100ML IV SOLN
500.0000 mg | Freq: Two times a day (BID) | INTRAVENOUS | Status: DC
  Administered 2023-06-07: 500 mg via INTRAVENOUS
  Filled 2023-06-07 (×2): qty 100

## 2023-06-07 MED ORDER — FLUDROCORTISONE ACETATE 0.1 MG PO TABS
0.2000 mg | ORAL_TABLET | Freq: Every day | ORAL | Status: DC
  Filled 2023-06-07: qty 2

## 2023-06-07 MED ORDER — MIDODRINE HCL 5 MG PO TABS: 20.0000 mg | ORAL_TABLET | Freq: Three times a day (TID) | ORAL | Status: DC

## 2023-06-07 MED ORDER — STERILE WATER FOR INJECTION IJ SOLN
INTRAMUSCULAR | Status: AC
  Filled 2023-06-07: qty 10

## 2023-06-07 MED ORDER — DIPHENHYDRAMINE HCL 50 MG/ML IJ SOLN
12.5000 mg | INTRAMUSCULAR | Status: AC
  Administered 2023-06-07: 12.5 mg via INTRAVENOUS
  Filled 2023-06-07: qty 1

## 2023-06-07 MED ORDER — SODIUM CHLORIDE 0.9 % IV BOLUS
1000.0000 mL | Freq: Once | INTRAVENOUS | Status: AC
  Administered 2023-06-07: 1000 mL via INTRAVENOUS

## 2023-06-07 MED ORDER — TRAZODONE HCL 50 MG PO TABS
25.0000 mg | ORAL_TABLET | Freq: Every evening | ORAL | Status: DC | PRN
  Administered 2023-06-07: 25 mg via ORAL
  Filled 2023-06-07: qty 1

## 2023-06-07 MED ORDER — SODIUM CHLORIDE 0.9 % IV SOLN
1.0000 g | INTRAVENOUS | Status: DC
  Administered 2023-06-07: 1 g via INTRAVENOUS
  Filled 2023-06-07: qty 10

## 2023-06-07 MED ORDER — ATORVASTATIN CALCIUM 20 MG PO TABS
20.0000 mg | ORAL_TABLET | Freq: Every day | ORAL | Status: DC
  Administered 2023-06-07: 20 mg
  Filled 2023-06-07: qty 1

## 2023-06-07 MED ORDER — ORAL CARE MOUTH RINSE
15.0000 mL | OROMUCOSAL | Status: DC
  Administered 2023-06-07 (×2): 15 mL via OROMUCOSAL

## 2023-06-07 MED ORDER — HYDROCORTISONE SOD SUC (PF) 100 MG IJ SOLR
50.0000 mg | Freq: Three times a day (TID) | INTRAMUSCULAR | Status: AC
  Administered 2023-06-07 – 2023-06-08 (×3): 50 mg via INTRAVENOUS
  Filled 2023-06-07 (×3): qty 1

## 2023-06-07 MED ORDER — ONDANSETRON HCL 4 MG/2ML IJ SOLN
4.0000 mg | Freq: Three times a day (TID) | INTRAMUSCULAR | Status: DC | PRN
Start: 2023-06-07 — End: 2023-06-08

## 2023-06-07 MED ORDER — ALBUTEROL SULFATE (2.5 MG/3ML) 0.083% IN NEBU: 2.5000 mg | INHALATION_SOLUTION | RESPIRATORY_TRACT | Status: DC | PRN

## 2023-06-07 MED ORDER — FAMOTIDINE 20 MG PO TABS
10.0000 mg | ORAL_TABLET | Freq: Every day | ORAL | Status: DC
  Administered 2023-06-07: 10 mg
  Filled 2023-06-07: qty 1

## 2023-06-07 MED ORDER — CHLORHEXIDINE GLUCONATE CLOTH 2 % EX PADS: 6.0000 | MEDICATED_PAD | Freq: Every day | CUTANEOUS | Status: DC

## 2023-06-07 MED ORDER — OSMOLITE 1.2 CAL PO LIQD: 1000.0000 mL | ORAL | Status: DC

## 2023-06-07 MED ORDER — VITAL HIGH PROTEIN PO LIQD
1000.0000 mL | ORAL | Status: DC
  Administered 2023-06-07: 1000 mL

## 2023-06-07 MED ORDER — IBUPROFEN 400 MG PO TABS
400.0000 mg | ORAL_TABLET | Freq: Four times a day (QID) | ORAL | Status: DC | PRN
  Administered 2023-06-07: 400 mg via ORAL
  Filled 2023-06-07: qty 1

## 2023-06-07 MED ORDER — SODIUM BICARBONATE 8.4 % IV SOLN
50.0000 meq | Freq: Once | INTRAVENOUS | Status: AC
  Administered 2023-06-07: 50 meq via INTRAVENOUS
  Filled 2023-06-07: qty 50

## 2023-06-07 MED ORDER — SODIUM CHLORIDE 0.9% FLUSH: 10.0000 mL | Freq: Two times a day (BID) | INTRAVENOUS | Status: DC

## 2023-06-07 MED ORDER — ACETAMINOPHEN 10 MG/ML IV SOLN
1000.0000 mg | Freq: Once | INTRAVENOUS | Status: AC
  Administered 2023-06-07 (×2): 1000 mg via INTRAVENOUS
  Filled 2023-06-07: qty 100

## 2023-06-07 MED ORDER — ALTEPLASE 2 MG IJ SOLR
INTRAMUSCULAR | Status: AC
  Filled 2023-06-07: qty 4

## 2023-06-07 MED ORDER — HEPARIN SODIUM (PORCINE) 1000 UNIT/ML DIALYSIS: 1000.0000 [IU] | INTRAMUSCULAR | Status: DC | PRN

## 2023-06-07 MED ORDER — HEPARIN SODIUM (PORCINE) 5000 UNIT/ML IJ SOLN
5000.0000 [IU] | Freq: Three times a day (TID) | INTRAMUSCULAR | Status: DC
  Administered 2023-06-07 – 2023-06-08 (×3): 5000 [IU] via SUBCUTANEOUS
  Filled 2023-06-07 (×4): qty 1

## 2023-06-07 MED ORDER — IPRATROPIUM-ALBUTEROL 0.5-2.5 (3) MG/3ML IN SOLN
3.0000 mL | RESPIRATORY_TRACT | Status: DC
  Administered 2023-06-07: 3 mL via RESPIRATORY_TRACT
  Filled 2023-06-07 (×2): qty 3

## 2023-06-07 MED ORDER — MIDODRINE HCL 5 MG PO TABS
20.0000 mg | ORAL_TABLET | Freq: Three times a day (TID) | ORAL | Status: DC
  Administered 2023-06-07 – 2023-06-08 (×4): 20 mg
  Filled 2023-06-07 (×4): qty 4

## 2023-06-07 MED ORDER — CHOLESTYRAMINE 4 G PO PACK
4.0000 g | PACK | Freq: Three times a day (TID) | ORAL | Status: DC
  Administered 2023-06-07 (×3): 4 g
  Filled 2023-06-07 (×5): qty 1

## 2023-06-07 MED ORDER — VITAMIN D 25 MCG (1000 UNIT) PO TABS
2000.0000 [IU] | ORAL_TABLET | Freq: Every day | ORAL | Status: DC
  Administered 2023-06-07: 2000 [IU]
  Filled 2023-06-07: qty 2

## 2023-06-07 MED ORDER — FREE WATER
30.0000 mL | Status: DC
  Administered 2023-06-07 – 2023-06-08 (×7): 30 mL
  Filled 2023-06-07 (×3): qty 30

## 2023-06-07 NOTE — Progress Notes (Addendum)
 Brief Nutrition Note  Consult received for enteral/tube feeding initiation and management.  Adult Enteral Nutrition Protocol initiated. Full assessment to follow.  G-tube tube in place with tip located in gastric lumen per xray imaging.   Admitting Dx: ESRD on hemodialysis (HCC) [N18.6, Z99.2] HCAP (healthcare-associated pneumonia) [J18.9] Severe sepsis (HCC) [A41.9, R65.20]  Body mass index is 28.91 kg/m.  Labs:  Recent Labs  Lab 06/06/23 2159  NA 135  K 4.5  CL 100  CO2 16*  BUN 39*  CREATININE 4.38*  CALCIUM 9.3  GLUCOSE 151*   Trickle start, Vital HP.   Jamelle Haring RDN, LDN Clinical Dietitian   If unable to reach, please contact "RD Inpatient" secure chat group between 8 am-4 pm daily"

## 2023-06-07 NOTE — Hospital Course (Addendum)
 Hospital course / significant events:   HPI: Alexandra Foster is a 73 y.o. female with medical history significant of s/p of G-tube, ESRD-HD (TTS), diet-controled DM, dCHF, A fib not on AC due to hx of SAH, HDL, HTN, COPD/asthma, OSA on CPAP, s/p of left AKA, who presents with SOB and AMS. Pecently hospitalized from 1/29 - 2/12 due to severe sepsis secondary to aspiration pneumonia and Klebsiella UTI. Per her daughter, patient is confused   02/22: to ED. SpO2 84% RA, respiratory distress, BiPAP started in ED. Nausea with 5 times of nonbilious nonbloody vomiting. WBC 8.9, procalcitonin 32.51, BNP  59.3, lactic acid 8.0 --> 7.2, troponin 11, negative PCR for COVID, flu and RSV, INR 1.1, PTT 26, abnormal liver function (ALP 118, AST 53, t98,22L.3), lipase 53, potassium 4.5, bicarbonate 16, creatinine 4.38, BUN 39, positive urinalysis (turbid appearance, moderate amount of leukocyte, rare bacteria, WBC > 50), chest x-ray showed multifocal infiltration.  02/23: admitted to hospitalist overnight for HCAP/aspiration pneumonia, UTI, severe sepsis. Abx Cefepime, Zyvox, (patient is allergic to vancomycin). Remains metabolic acidosis w/ increased WOB and confused, HD today and back on BiPap in the afternoon and to overnight. See IPAL note and discussion on code status below.  02/24:  agitated overnight responded to clonazepam. Tube site drainage, holding tube feeds. Remains on BiPap. HD this morning unable to complete - BP not tolerating, pt increased WOB even w/ BiPap. Decision made w/ family this afternoon for comfort measures     Consultants:  Nephrology   Procedures/Surgeries: none      ASSESSMENT & PLAN:   Comfort measures End of life care  D/c aggressive treatments, non-comfort medications/interventions, labs, etc  Morphine for pain/air hunger Benzo for anxiety/agitation Haldol if benzo ineffective Robunil prn secretions Acetaminophen prn fever  OK to d/c BiPAP once on morphine for 30  mins or so / once patient WOB eases on morphine  OK for Foley if needed OK for telemetry if desired but please alert central tele monitoring that patient is comfort measures and do not alert for abnormal rhythm, ok to alert if pt is in asystole >2 mins   Acute hypoxic respiratory failure due to HCAP (healthcare-associated pneumonia) and aspiration pneumonia  HCAP, aspiration pneumonia/pneumonitis  associated w/ severe sepsis - POA   UTI (urinary tract infection) associated w/ severe sepsis - POA   Metabolic acidosis  LUE edema  Acute metabolic encephalopathy, multifactorial d/t sepsis, uremia, hypoxia/hypercarbia   COPD (chronic obstructive pulmonary disease)   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  May also be shock liver d/t hypotension/sepsis    OSA (obstructive sleep apnea)   Anxiety and depression   Stage II decubitus ulcer sacrum POA  ADVANCED CARE PLANNING See previous progress notes / IPAL    overweight based on BMI: Body mass index is 28.91 kg/m.  Underweight - under 18  overweight - 25 to 29 obese - 30 or more Class 1 obesity: BMI of 30.0 to 34 Class 2 obesity: BMI of 35.0 to 39 Class 3 obesity: BMI of 40.0 to 49 Super Morbid Obesity: BMI 50-59 Super-super Morbid Obesity: BMI 60+ Significantly low or high BMI is associated with higher medical risk.  Weight management advised as adjunct  to other disease management and risk reduction treatments    DVT prophylaxis: n/a IV fluids: n/a Nutrition: n/a Central lines / invasive devices: ok for Foley if needed  Code Status: DNR ACP documentation reviewed:  none on file in [facility]  TOC needs:  TBD Barriers to dispo / significant pending items: anticipate patient to pass in the hospital

## 2023-06-07 NOTE — H&P (Signed)
 History and Physical    Alexandra Foster ZOX:[id number] DOB: July 17, 1950 DOA: 06/06/2023  Referring MD/NP/PA:   PCP: [name], MD   Patient coming from:  The patient is coming from home.     Chief Complaint: SOB and AMS  HPI: Alexandra Foster is a 73 y.o. female with medical history significant of s/p of G-tube, ESRD-HD (TTS), diet-controled DM, dCHF, A fib not on AC due to hx of SAH, HDL, HTN, COPD/asthma, OSA on CPAP, s/p of left AKA, who presents with SOB and AMS.  Patient was recently hospitalized from 1/29 - 2/12 due to severe sepsis secondary to aspiration pneumonia and Klebsiella UTI. Per her daughter, patient is confused today.  She keeps talking about the things which happened 20 years ago and which does not make sense.  Patient has SOB and cough with white mucus production.  Patient was found to have oxygen desaturation to 84% on room air, with severe respiratory distress, cannot speak in full sentence, and BiPAP is started in ED.  Patient also has nausea with 5 times of nonbilious nonbloody vomiting. Pt does not seem to have abdominal pain or diarrhea per her daughter.  Patient has fever and chills.       Data reviewed independently and ED Course: pt was found to have WBC 8.9, procalcitonin 32.51, BNP  59.3, lactic acid 8.0 --> 7.2, troponin 11, negative PCR for COVID, flu and RSV, INR 1.1, PTT 26, abnormal liver function (ALP 118, AST 53, t98,22L.3), lipase 53, potassium 4.5, bicarbonate 16, creatinine 4.38, BUN 39, positive urinalysis (turbid appearance, moderate amount of leukocyte, rare bacteria, WBC > 50), chest x-ray showed multifocal infiltration.  Patient is admitted to PCU as inpatient.   EKG: I have personally reviewed.  Sinus rhythm, QTc 458, right bundle blockade, nonspecific T wave change.   Review of Systems:   General: Has fevers, chills, no body weight gain, has poor appetite, has fatigue HEENT: no blurry vision, hearing changes or sore  throat Respiratory: has dyspnea, coughing, no wheezing CV: no chest pain, no palpitations GI: has nausea, vomiting, no abdominal pain, diarrhea, constipation GU: no dysuria, burning on urination, increased urinary frequency, hematuria  Ext: has leg edema Neuro: no unilateral weakness, numbness, or tingling, no vision change or hearing loss Skin: Has sacral wound MSK: No muscle spasm, no deformity, no limitation of range of movement in spin Heme: No easy bruising.  Travel history: No recent long distant travel.   Allergy:  Allergies  Allergen Reactions   Augmentin [Amoxicillin-Pot Clavulanate] Dermatitis   Chlorhexidine Dermatitis   Vancomycin Rash    Skin peeling    Zosyn [Piperacillin-Tazobactam In Dex] Rash    Past Medical History:  Diagnosis Date   Adrenal insufficiency (HCC) 02/13/2023   AKI (acute kidney injury) (HCC)    a. 04/2021 in setting of bacteremia/shock.   Arthritis    Asthma    Bacteremia    a. 04/2021 S pyogenes bacteremia in setting of lower ext cellulitis.   COPD (chronic obstructive pulmonary disease) (HCC)    Diabetes mellitus without complication (HCC)    Endometriosis    GERD (gastroesophageal reflux disease)    History of echocardiogram    a. 07/2013 Echo: EF 55-60%, impaired relaxation, mild TR; b. 04/2021 Echo: EF 50-55%, mild LVH, nl RV fxn, mild BAE, Ao sclerosis w/o stenosis.   Hypertension    Obesity    PAF (paroxysmal atrial fibrillation) (HCC)    a. 04/2021 in setting of septic shock/cellulitis.  Sleep apnea    CPAP    Past Surgical History:  Procedure Laterality Date   ABDOMINAL HYSTERECTOMY     APPLICATION OF WOUND VAC Left 05/17/2021   Procedure: APPLICATION OF WOUND VAC/WOUND VAC EXCHANGE-Matrix Myriad;  Surgeon: Carolan Shiver, MD;  Location: [facility];  Service: General;  Laterality: Left;   APPLICATION OF WOUND VAC  05/24/2021   Procedure: APPLICATION OF WOUND VAC;  Surgeon: Carolan Shiver, MD;  Location: [facility];   Service: General;;   APPLICATION OF WOUND VAC  05/10/2021   Procedure: APPLICATION OF WOUND VAC;  Surgeon: Carolan Shiver, MD;  Location: [facility];  Service: General;;   COLONOSCOPY  10/29/2006   Dr Servando Snare   COLONOSCOPY WITH PROPOFOL N/A 11/05/2016   Procedure: COLONOSCOPY WITH PROPOFOL;  Surgeon: Earline Mayotte, MD;  Location: [facility];  Service: Endoscopy;  Laterality: N/A;   DIALYSIS/PERMA CATHETER INSERTION N/A 12/19/2022   Procedure: DIALYSIS/PERMA CATHETER INSERTION;  Surgeon: Annice Needy, MD;  Location: [facility];  Service: Cardiovascular;  Laterality: N/A;   DIALYSIS/PERMA CATHETER INSERTION N/A 02/04/2023   Procedure: DIALYSIS/PERMA CATHETER INSERTION;  Surgeon: Annice Needy, MD;  Location: [facility];  Service: Cardiovascular;  Laterality: N/A;   DIALYSIS/PERMA CATHETER INSERTION N/A 02/16/2023   Procedure: DIALYSIS/PERMA CATHETER INSERTION;  Surgeon: Annice Needy, MD;  Location: [facility];  Service: Cardiovascular;  Laterality: N/A;   DIALYSIS/PERMA CATHETER INSERTION N/A 03/23/2023   Procedure: DIALYSIS/PERMA CATHETER INSERTION;  Surgeon: Annice Needy, MD;  Location: [facility];  Service: Cardiovascular;  Laterality: N/A;   INCISION AND DRAINAGE OF WOUND Left 05/24/2021   Procedure: IRRIGATION AND DEBRIDEMENT LEFT LEG;  Surgeon: Carolan Shiver, MD;  Location: [facility];  Service: General;  Laterality: Left;   INCISION AND DRAINAGE OF WOUND Left 05/10/2021   Procedure: IRRIGATION AND DEBRIDEMENT LEFT LEG;  Surgeon: Carolan Shiver, MD;  Location: [facility];  Service: General;  Laterality: Left;   IR FLUORO GUIDE CV LINE RIGHT  06/12/2021   IR PERC TUN PERIT CATH WO PORT S&I /IMAG  06/12/2021   IR RADIOLOGIST EVAL & MGMT  03/04/2023   IR REPLC GASTRO/COLONIC TUBE PERCUT W/FLUORO  06/12/2021   IVC FILTER INSERTION N/A 05/29/2021   Procedure: IVC FILTER INSERTION;  Surgeon: Renford Dills, MD;  Location: [facility];   Service: Cardiovascular;  Laterality: N/A;   MINOR GRAFT APPLICATION  05/24/2021   Procedure: Myriad Matrix  APPLICATION;  Surgeon: Carolan Shiver, MD;  Location: [facility];  Service: General;;   NASAL SINUS SURGERY  2002   Dr Chestine Spore   PEG PLACEMENT N/A 05/28/2021   Procedure: PERCUTANEOUS ENDOSCOPIC GASTROSTOMY (PEG) PLACEMENT;  Surgeon: Sung Amabile, DO;  Location: [facility];  Service: General;  Laterality: N/A;  TRAVEL CASE   TRACHEOSTOMY TUBE PLACEMENT N/A 05/17/2021   Procedure: TRACHEOSTOMY;  Surgeon: Geanie Logan, MD;  Location: [facility];  Service: ENT;  Laterality: N/A;   WOUND DEBRIDEMENT Left 05/07/2021   Procedure: DEBRIDEMENT WOUND;  Surgeon: Carolan Shiver, MD;  Location: [facility];  Service: General;  Laterality: Left;    Social History:  reports that she quit smoking about 22 years ago. Her smoking use included cigarettes. She started smoking about 62 years ago. She has a 80 pack-year smoking history. She quit smokeless tobacco use about 22 years ago.  Her smokeless tobacco use included snuff. She reports that she does not drink alcohol and does not use drugs.  Family History:  Family History  Problem Relation Age of Onset   Congestive Heart Failure Mother    Coronary artery disease Father 52   Breast cancer Sister 57   Heart disease Brother    Varicose Veins Brother    Alcohol abuse Brother      Prior to Admission medications   Medication Sig Start Date End Date Taking? Authorizing Provider  acetaminophen (TYLENOL) 325 MG tablet Place 2 tablets (650 mg total) into feeding tube every 6 (six) hours as needed for mild pain (or Fever >/= 101). 12/29/22  Yes Wieting, Richard, MD  ascorbic acid (VITAMIN C) 500 MG tablet Place 0.5 tablets (250 mg total) into feeding tube daily. 03/08/23  Yes Delfino Lovett, MD  atorvastatin (LIPITOR) 20 MG tablet Place 1 tablet (20 mg total) into feeding tube daily. 03/20/23  Yes Mecum, Erin E, PA-C  cholestyramine (QUESTRAN) 4 g packet  Place 1 packet (4 g total) into feeding tube 3 (three) times daily. 05/27/23  Yes Wieting, Richard, MD  clonazePAM (KLONOPIN) 0.5 MG tablet TAKE 1/2 TO 1 (ONE-HALF TO ONE) TABLET BY MOUTH ONCE DAILY AS NEEDED FOR ANXIETY 04/23/23  Yes Sowles, [name], MD  escitalopram (LEXAPRO) 10 MG tablet Place 1 tablet (10 mg total) into feeding tube daily. 05/27/23  Yes Wieting, Richard, MD  famotidine (PEPCID) 10 MG tablet Place 1 tablet (10 mg total) into feeding tube daily. 02/11/23  Yes Sowles, [name], MD  ferrous sulfate (FER-IN-SOL) 75 (15 Fe) MG/ML SOLN Place 4 mLs (60 mg of iron total) into feeding tube daily. 05/27/23  Yes Wieting, Richard, MD  fludrocortisone (FLORINEF) 0.1 MG tablet PLACE TWO TABLETS INTO FEEDING TUBE DAILY 04/30/23  Yes Sowles, [name], MD  hydrOXYzine (ATARAX) 50 MG tablet Take 1 tablet (50 mg total) by mouth 3 (three) times daily as needed for itching. 05/27/23  Yes Wieting, Richard, MD  liver oil-zinc oxide (DESITIN) 40 % ointment Apply topically 3 (three) times daily as needed for irritation. Apply to skin around G tube 3 times daily as needed for skin irritation 05/27/23  Yes Wieting, Richard, MD  metoCLOPramide (REGLAN) 5 MG tablet Place 1 tablet (5 mg total) into feeding tube 4 (four) times daily -  before meals and at bedtime. 05/27/23  Yes Wieting, Richard, MD  midodrine (PROAMATINE) 10 MG tablet Take 2 tablets (20 mg total) by mouth 3 (three) times daily. 05/27/23  Yes Wieting, Richard, MD  multivitamin (RENA-VIT) TABS tablet PLACE ONE TABLET INTO FEEDING TUBE AT BEDTIME 04/21/23  Yes Mecum, Erin E, PA-C  Nutritional Supplements (FEEDING SUPPLEMENT, NEPRO CARB STEADY,) LIQD Place 1,000 mLs into feeding tube continuous. 45 ml/hour 05/27/23  Yes Wieting, Richard, MD  nystatin (MYCOSTATIN/NYSTOP) powder Apply topically 3 (three) times daily. 05/27/23  Yes Alford Highland, MD  traZODone (DESYREL) 50 MG tablet Take 0.5 tablets (25 mg total) by mouth at bedtime as needed for sleep. 05/27/23  Yes  Wieting, Richard, MD  vitamin D3 (CHOLECALCIFEROL) 25 MCG tablet Place 2 tablets (2,000 Units total) into feeding tube daily. 01/02/23  Yes Enedina Finner, MD  Water For Irrigation, Sterile (FREE WATER) SOLN Place 30 mLs into feeding tube every 4 (four) hours. 05/27/23  Yes Wieting, Richard, MD  glucose blood test strip Use 1 strip to check blood glucose levels 2 times daily 03/30/23   [name], MD    Physical Exam: Vitals:   06/07/23 0030 06/07/23 0100 06/07/23 0116 06/07/23 0152  BP: (!) 146/22 (!) 153/55    Pulse: 90 87    Resp: (!) 29 [name])  25    Temp:   98 F (36.7 C) (!) 101.9 F (38.8 C)  TempSrc:   Oral Rectal  SpO2: 100% 100%     General: Has acute respiratory distress HEENT:       Eyes: PERRL, EOMI, no jaundice       ENT: No discharge from the ears and nose, no pharynx injection, no tonsillar enlargement.        Neck: No JVD, no bruit, no mass felt. Heme: No neck lymph node enlargement. Cardiac: S1/S2, RRR, No murmurs, No gallops or rubs. Respiratory: Has crackles bilaterally GI: Soft, nondistended, nontender, no organomegaly, BS present. GU: No hematuria Ext: has trace leg edema bilaterally. S/p of left AKA Musculoskeletal: No joint deformities, No joint redness or warmth, no limitation of ROM in spin. Skin: Has sacral wound Neuro: Confused, partially following command, cranial nerves II-XII grossly intact, moves all extremities normally.  Psych: Patient is not psychotic, no suicidal or hemocidal ideation.  Labs on Admission: I have personally reviewed following labs and imaging studies  CBC: Recent Labs  Lab 06/06/23 2159  WBC 8.9  NEUTROABS 7.5  HGB 13.4  HCT 44.7  MCV 88.2  PLT 317   Basic Metabolic Panel: Recent Labs  Lab 06/06/23 2159  NA 135  K 4.5  CL 100  CO2 16*  GLUCOSE 151*  BUN 39*  CREATININE 4.38*  CALCIUM 9.3   GFR: Estimated Creatinine Clearance: 11.6 mL/min (A) (by C-G formula based on SCr of 4.38 mg/dL (H)). Liver Function  Tests: Recent Labs  Lab 06/06/23 2159  AST 90*  ALT 39  ALKPHOS 118  BILITOT 1.3*  PROT 7.7  ALBUMIN 2.9*   Recent Labs  Lab 06/06/23 2159  LIPASE 53*   No results for input(s): "AMMONIA" in the last 168 hours. Coagulation Profile: Recent Labs  Lab 06/06/23 2159  INR 1.1   Cardiac Enzymes: No results for input(s): "CKTOTAL", "CKMB", "CKMBINDEX", "TROPONINI" in the last 168 hours. BNP (last 3 results) No results for input(s): "PROBNP" in the last 8760 hours. HbA1C: No results for input(s): "HGBA1C" in the last 72 hours. CBG: No results for input(s): "GLUCAP" in the last 168 hours. Lipid Profile: No results for input(s): "CHOL", "HDL", "LDLCALC", "TRIG", "CHOLHDL", "LDLDIRECT" in the last 72 hours. Thyroid Function Tests: No results for input(s): "TSH", "T4TOTAL", "FREET4", "T3FREE", "THYROIDAB" in the last 72 hours. Anemia Panel: No results for input(s): "VITAMINB12", "FOLATE", "FERRITIN", "TIBC", "IRON", "RETICCTPCT" in the last 72 hours. Urine analysis:    Component Value Date/Time   COLORURINE AMBER (A) 06/06/2023 2158   APPEARANCEUR TURBID (A) 06/06/2023 2158   LABSPEC 1.016 06/06/2023 2158   PHURINE 5.0 06/06/2023 2158   GLUCOSEU NEGATIVE 06/06/2023 2158   HGBUR NEGATIVE 06/06/2023 2158   BILIRUBINUR NEGATIVE 06/06/2023 2158   KETONESUR NEGATIVE 06/06/2023 2158   PROTEINUR 100 (A) 06/06/2023 2158   NITRITE NEGATIVE 06/06/2023 2158   LEUKOCYTESUR MODERATE (A) 06/06/2023 2158   Sepsis Labs: @LABRCNTIP (procalcitonin:4,lacticidven:4) ) Recent Results (from the past 240 hours)  Resp panel by RT-PCR (RSV, Flu A&B, Covid) Anterior Nasal Swab     Status: None   Collection Time: 06/06/23  9:59 PM   Specimen: Anterior Nasal Swab  Result Value Ref Range Status   SARS Coronavirus 2 by RT PCR NEGATIVE NEGATIVE Final    Comment: (NOTE) SARS-CoV-2 target nucleic acids are NOT DETECTED.  The SARS-CoV-2 RNA is generally detectable in upper respiratory specimens  during the acute phase of infection. The lowest concentration of SARS-CoV-2  viral copies this assay can detect is 138 copies/mL. A negative result does not preclude SARS-Cov-2 infection and should not be used as the sole basis for treatment or other patient management decisions. A negative result may occur with  improper specimen collection/handling, submission of specimen other than nasopharyngeal swab, presence of viral mutation(s) within the areas targeted by this assay, and inadequate number of viral copies(<138 copies/mL). A negative result must be combined with clinical observations, patient history, and epidemiological information. The expected result is Negative.  Fact Sheet for Patients:  BloggerCourse.com  Fact Sheet for Healthcare Providers:  SeriousBroker.it  This test is no t yet approved or cleared by the [place] FDA and  has been authorized for detection and/or diagnosis of SARS-CoV-2 by FDA under an Emergency Use Authorization (EUA). This EUA will remain  in effect (meaning this test can be used) for the duration of the COVID-19 declaration under Section 564(b)(1) of the Act, 21 U.S.C.section 360bbb-3(b)(1), unless the authorization is terminated  or revoked sooner.       Influenza A by PCR NEGATIVE NEGATIVE Final   Influenza B by PCR NEGATIVE NEGATIVE Final    Comment: (NOTE) The Xpert Xpress SARS-CoV-2/FLU/RSV plus assay is intended as an aid in the diagnosis of influenza from Nasopharyngeal swab specimens and should not be used as a sole basis for treatment. Nasal washings and aspirates are unacceptable for Xpert Xpress SARS-CoV-2/FLU/RSV testing.  Fact Sheet for Patients: BloggerCourse.com  Fact Sheet for Healthcare Providers: SeriousBroker.it  This test is not yet approved or cleared by the [place] FDA and has been authorized for detection  and/or diagnosis of SARS-CoV-2 by FDA under an Emergency Use Authorization (EUA). This EUA will remain in effect (meaning this test can be used) for the duration of the COVID-19 declaration under Section 564(b)(1) of the Act, 21 U.S.C. section 360bbb-3(b)(1), unless the authorization is terminated or revoked.     Resp Syncytial Virus by PCR NEGATIVE NEGATIVE Final    Comment: (NOTE) Fact Sheet for Patients: BloggerCourse.com  Fact Sheet for Healthcare Providers: SeriousBroker.it  This test is not yet approved or cleared by the [place] FDA and has been authorized for detection and/or diagnosis of SARS-CoV-2 by FDA under an Emergency Use Authorization (EUA). This EUA will remain in effect (meaning this test can be used) for the duration of the COVID-19 declaration under Section 564(b)(1) of the Act, 21 U.S.C. section 360bbb-3(b)(1), unless the authorization is terminated or revoked.  Performed at [facility], [address] [place], [place] [address]      Radiological Exams on Admission:   Assessment/Plan Principal Problem:   HCAP (healthcare-associated pneumonia) Active Problems:   Acute hypoxic respiratory failure (HCC)   Aspiration pneumonia (HCC)   UTI (urinary tract infection)   Severe sepsis (HCC)   Acute metabolic encephalopathy   COPD (chronic obstructive pulmonary disease) (HCC)   ESRD on hemodialysis (HCC)   Atrial fibrillation and flutter (HCC)   Dyslipidemia   Adrenal insufficiency (HCC)   Chronic diastolic CHF (congestive heart failure) (HCC)   G tube feedings (HCC)   Nausea & vomiting   OSA (obstructive sleep apnea)   Anxiety and depression   Stage II decubitus ulcer (HCC)   Assessment and Plan:   Acute hypoxic respiratory failure due to HCAP (healthcare-associated pneumonia) and aspiration pneumonia:   - Will admit to med-surg bed   as inpt - on BiPAP --> try to wean off BIPAP -  Abx: Cefepime, Zyvox, Flagyl (patient is allergic  to vancomycin) - Incentive spirometry - Mucinex for cough  - Bronchodilators - Urine legionella and S. pneumococcal antigen - Follow up blood culture x2, sputum culture - Check respiratory virus panel  UTI (urinary tract infection) -on broad antibiotics as above -f/u Urine culture  Severe sepsis due to HCAP, aspiration pneumonia and UTI: pt meets criteria for severe sepsis with fever of 101.8, heart rate of 106, RR 35, lactic acid 8.0 --> 7.2.  Procalcitonin 32.51. -IV fluid: 1.5 of NS bolus in ED, followed by 50 mL per hour of NS (patient has ESRD and CHF, limiting aggressive IV fluids treatment) -trend lactic acid   Acute metabolic encephalopathy: Likely multifactorial etiology, particularly severe sepsis and hypoxia, UTI -Frequent neurocheck -Fall precaution  COPD (chronic obstructive pulmonary disease) (HCC) -Bronchodilators as needed Mucinex  ESRD on hemodialysis (TTS): Patient had a full course of dialysis today, magnesium normal 4.5. -Message sent to Dr. Cherylann Ratel of renal for dialysis  Atrial fibrillation and flutter [facility]): Heart rate 106 --> 98 -Telemonitoring  Dyslipidemia -Lipitor  History of adrenal insufficiency (HCC): Blood pressure soft at 99/57 -Continue home Florinef -Start stress dose Solu-Cortef, 100 mg, then 50 mg q8hour x 3 doses -Continue home midodrine 10 mg 3 times daily  Chronic diastolic CHF (congestive heart failure) (HCC): 2D echo on 02/15/2023 showed EF 60 to 65%.  BNP normal 59. -Volume management per renal by dialysis  G tube feedings (HCC) -Ordered tube feeding protocol  Nausea & vomiting: Etiology is not clear.  Patient does have some mild abnormal liver function which is likely due to severe sepsis and ongoing infection.  Lipase normal. -Follow-up CT scan of abdomen/pelvis  OSA (obstructive sleep apnea) -Currently on BiPAP  Anxiety and depression -Continue home medications  Stage II  decubitus ulcer (HCC) -Wound care consult       DVT ppx: SQ Heparin     Code Status: pt had DRN order in the past, but today her daughter who is the power of attorney wants patient to be full code.  Family Communication:   Yes, patient's daughter  at bed side.         Disposition Plan:  Anticipate discharge back to previous environment  Consults called:  Dr. Cherylann Ratel of renal  Admission status and Level of care: Progressive: as inpt        Dispo: The patient is from: Home              Anticipated d/c is to: Home              Anticipated d/c date is: 2 days              Patient currently is not medically stable to d/c.    Severity of Illness:  The appropriate patient status for this patient is INPATIENT. Inpatient status is judged to be reasonable and necessary in order to provide the required intensity of service to ensure the patient's safety. The patient's presenting symptoms, physical exam findings, and initial radiographic and laboratory data in the context of their chronic comorbidities is felt to place them at high risk for further clinical deterioration. Furthermore, it is not anticipated that the patient will be medically stable for discharge from the hospital within 2 midnights of admission.   * I certify that at the point of admission it is my clinical judgment that the patient will require inpatient hospital care spanning beyond 2 midnights from the point of admission due to high intensity of service, high risk for  further deterioration and high frequency of surveillance required.*       Date of Service 06/07/2023    Lorretta Harp [facility]   If 7PM-7AM, please contact night-coverage [email or web address] 06/07/2023, 2:03 AM

## 2023-06-07 NOTE — Progress Notes (Signed)
 Patient arrieved via bed from ER with nurse and RT accompanying patient.

## 2023-06-07 NOTE — Progress Notes (Signed)
 PROGRESS NOTE    Alexandra Foster   WUJ:[id number] DOB: June 20, 1950  DOA: 06/06/2023 Date of Service: 06/07/23 which is hospital day 0  PCP: [name], MD    Hospital course / significant events:   HPI: Alexandra Foster is a 73 y.o. female with medical history significant of s/p of G-tube, ESRD-HD (TTS), diet-controled DM, dCHF, A fib not on AC due to hx of SAH, HDL, HTN, COPD/asthma, OSA on CPAP, s/p of left AKA, who presents with SOB and AMS. Pecently hospitalized from 1/29 - 2/12 due to severe sepsis secondary to aspiration pneumonia and Klebsiella UTI. Per her daughter, patient is confused   02/22: to ED. SpO2 84% RA, respiratory distress, BiPAP started in ED. Nausea with 5 times of nonbilious nonbloody vomiting. WBC 8.9, procalcitonin 32.51, BNP  59.3, lactic acid 8.0 --> 7.2, troponin 11, negative PCR for COVID, flu and RSV, INR 1.1, PTT 26, abnormal liver function (ALP 118, AST 53, t98,22L.3), lipase 53, potassium 4.5, bicarbonate 16, creatinine 4.38, BUN 39, positive urinalysis (turbid appearance, moderate amount of leukocyte, rare bacteria, WBC > 50), chest x-ray showed multifocal infiltration.  02/23: admitted to hospitalist overnight for HCAP/aspiration pneumonia, UTI, severe sepsis. Abx Cefepime, Zyvox, (patient is allergic to vancomycin). Remains acidotic, HD today. See discussion on code status below      Consultants:  Nephrology   Procedures/Surgeries: none      ASSESSMENT & PLAN:   Acute hypoxic respiratory failure due to HCAP (healthcare-associated pneumonia) and aspiration pneumonia:   BiPAP to wean as able Treating underlying causes   HCAP, aspiration pneumonia/pneumonitis  associated w/ severe sepsis - POA Abx: Cefepime, Zyvox, Flagyl (patient is allergic to vancomycin) Supplemental O2  ESRD limits aggressive fluids Incentive spirometry Mucinex for cough  Bronchodilators Urine legionella and S. pneumococcal antigen Follow up blood culture  x2, sputum culture Check respiratory virus panel   UTI (urinary tract infection) associated w/ severe sepsis - POA on broad antibiotics as above ESRD limits aggressive fluids f/u Urine culture   Metabolic acidosis Likely dt sepsis, complicated by ESRD Bicarb infusion Monitor VBG --> still acidotic Nephrology to move dialysis to today   LUE edema Follow ultrasound   Acute metabolic encephalopathy: Likely multifactorial etiology, particularly severe sepsis and hypoxia, UTI Frequent neurocheck Fall precaution Treating underlying causes    COPD (chronic obstructive pulmonary disease) Bronchodilators as needed Mucinex   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 Nephrology to follow    Atrial fibrillation and flutter: Heart rate 106 --> 98 Telemonitoring Low BP precludes rate control meds Hx SAH precludes anticoagulation   Dyslipidemia Lipitor   Adrenal insufficiency associated with chronic hypotension Blood pressure soft at 99/57 Continue home Florinef Start stress dose Solu-Cortef, 100 mg, then 50 mg q8hour x 3 doses Continue home midodrine 10 mg 3 times daily   Chronic diastolic CHF (congestive heart failure) : 2D echo on 02/15/2023 showed EF 60 to 65%.  BNP normal 59. Volume management per renal by dialysis   G tube feedings Ordered tube feeding protocol   Nausea & vomiting: Etiology is not clear.  Patient does have some mild abnormal liver function which is likely due to severe sepsis and ongoing infection.  Lipase normal. Follow-up CT scan of abdomen/pelvis --> no concerns Symptomatic care, antiemetics Aspiration precautions    Abnormal liver enzymes Imaging demonstrates nodular liver - clinical significance w/ concerns for cirrhosis.  May also be shock liver d/t hypotension/sepsis   Follow  periodic CMP  OSA (obstructive sleep apnea) Currently on BiPAP   Anxiety and depression Continue home medications:  klonopin prn, lexapro   Stage II decubitus ulcer sacrum POA Wound care consult  ADVANCED CARE PLANNING Discussed w/ husband at bedside who defers to daughter, spoke to daughter on the phone mid-day. Updated her that we are treating fairly severe multifocal pneumonia, likely there is an aspiration component, also UTI. Given steroid therapy, ESRD, debility she is of course predisposed to infection. Already challenging to maintain adequate blood pressure.  I advised that sepsis/hypotension can affect cardiac perfusion such that arrest is possible, particularly with ESRD patients around dialysis, risk of hypovolemia. She is high risk for decompensation from a cardiovascular AND respiratory standpoint  I explained that I would be very reluctant to perform CPR on Ms. Danielson, very reluctant to intubate her - I do not believe these interventions would be successful in resuscitating her, or even if they were she would not recover what she would consider an acceptable QOL, furthermore would be likely to decompensate again. I advised that I do not want patients to be in pain if it will not benefit them, daughter was surprised to learn that CPR/intubation is painful and wants to talk to her dad / patient's husband before making a firm decision on code status. I advised against CPR/intubation.  Daughter agrees to revisit this issue later today after discussion w/ her dad.     overweight based on BMI: Body mass index is 28.91 kg/m.  Underweight - under 18  overweight - 25 to 29 obese - 30 or more Class 1 obesity: BMI of 30.0 to 34 Class 2 obesity: BMI of 35.0 to 39 Class 3 obesity: BMI of 40.0 to 49 Super Morbid Obesity: BMI 50-59 Super-super Morbid Obesity: BMI 60+ Significantly low or high BMI is associated with higher medical risk.  Weight management advised as adjunct to other disease management and risk reduction treatments    DVT prophylaxis: heparin IV fluids: [place] continuous IV fluids held for  now given ESRD/CHF hx and BP has improved, also on sodium bicarb infusion Nutrition: tube feeds Central lines / invasive devices: Gtube, HD cath in R internal jugular  Code Status: FULL CODE for now see above re: advanced care planning  ACP documentation reviewed:  none on file in [facility]  TOC needs: TBD Barriers to dispo / significant pending items: await cultures to target abx, stabilize other problems noted above, expect several days in hospital              Subjective / Brief ROS:  Patient reports "feeling ok" Not able to carry on detailed conversation Pain controlled.    Family Communication: husband at bedside on rounds, I spoke to daughter over the phone mid-day     Objective Findings:  Vitals:   06/07/23 0645 06/07/23 0815 06/07/23 0816 06/07/23 1100  BP: (!) 127/97     Pulse: 77     Resp: 20     Temp: 98.8 F (37.1 C) 98.7 F (37.1 C)  98.4 F (36.9 C)  TempSrc: Axillary Axillary  Axillary  SpO2: 97%  95%   Weight:      Height:        Intake/Output Summary (Last 24 hours) at 06/07/2023 1347 Last data filed at 06/07/2023 0600 Gross per 24 hour  Intake 1414.42 ml  Output --  Net 1414.42 ml   Filed Weights   06/07/23 0221  Weight: 76.4 kg    Examination:  Physical  Exam Constitutional:      Appearance: She is ill-appearing. She is not toxic-appearing.  Cardiovascular:     Rate and Rhythm: Normal rate and regular rhythm.  Pulmonary:     Breath sounds: Rales present.  Abdominal:     General: Abdomen is flat.     Palpations: Abdomen is soft.  Musculoskeletal:     Right lower leg: No edema.  Skin:    General: Skin is warm and dry.          Scheduled Medications:   ascorbic acid  250 mg Per Tube Daily   atorvastatin  20 mg Per Tube Daily   [START ON 05/29/2023] Chlorhexidine Gluconate Cloth  6 each Topical Q0600   vitamin D3  2,000 Units Per Tube Daily   cholestyramine  4 g Per Tube TID   escitalopram  10 mg Per Tube Daily    famotidine  10 mg Per Tube Daily   feeding supplement (VITAL HIGH PROTEIN)  1,000 mL Per Tube Q24H   ferrous sulfate  60 mg of iron Per Tube Daily   [START ON 05/30/2023] fludrocortisone  0.2 mg Per Tube Daily   free water  30 mL Per Tube Q4H   heparin  5,000 Units Subcutaneous Q8H   hydrocortisone sod succinate (SOLU-CORTEF) inj  50 mg Intravenous Q8H   ipratropium-albuterol  3 mL Nebulization Q6H   midodrine  20 mg Per Tube TID WC   multivitamin  1 tablet Per Tube Q1400   mouth rinse  15 mL Mouth Rinse 4 times per day    Continuous Infusions:  ceFEPime (MAXIPIME) IV     linezolid (ZYVOX) IV 600 mg (06/07/23 1001)    PRN Medications:  albuterol, alteplase, clonazePAM, dextromethorphan-guaiFENesin, heparin, hydrOXYzine, ibuprofen, ondansetron (ZOFRAN) IV, mouth rinse, phenol, traZODone  Antimicrobials from admission:  Anti-infectives (From admission, onward)    Start     Dose/Rate Route Frequency Ordered Stop   06/07/23 2200  ceFEPIme (MAXIPIME) 1 g in sodium chloride 0.9 % 100 mL IVPB        1 g 200 mL/hr over 30 Minutes Intravenous Every 24 hours 06/07/23 0310     06/07/23 0200  linezolid (ZYVOX) IVPB 600 mg        600 mg 300 mL/hr over 60 Minutes Intravenous Every 12 hours 06/07/23 0110     06/07/23 0200  metroNIDAZOLE (FLAGYL) IVPB 500 mg  Status:  Discontinued        500 mg 100 mL/hr over 60 Minutes Intravenous Every 12 hours 06/07/23 0142 06/07/23 1223   06/06/23 2230  ceFEPIme (MAXIPIME) 2 g in sodium chloride 0.9 % 100 mL IVPB        2 g 200 mL/hr over 30 Minutes Intravenous  Once 06/06/23 2215 06/06/23 2258           Data Reviewed:  I have personally reviewed the following...  CBC: Recent Labs  Lab 06/06/23 2159 06/07/23 0509  WBC 8.9 11.2*  NEUTROABS 7.5  --   HGB 13.4 11.3*  HCT 44.7 38.3  MCV 88.2 89.3  PLT 317 246   Basic Metabolic Panel: Recent Labs  Lab 06/06/23 2159 06/07/23 0800  NA 135 133*  K 4.5 5.2*  CL 100 103  CO2 16* 12*   GLUCOSE 151* 132*  BUN 39* 42*  CREATININE 4.38* 4.52*  CALCIUM 9.3 8.4*   GFR: Estimated Creatinine Clearance: 10.9 mL/min (A) (by C-G formula based on SCr of 4.52 mg/dL (H)). Liver Function Tests: Recent Labs  Lab 06/06/23 2159 06/07/23 0800  AST 90* 71*  ALT 39 39  ALKPHOS 118 75  BILITOT 1.3* 0.6  PROT 7.7 6.8  ALBUMIN 2.9* 2.5*   Recent Labs  Lab 06/06/23 2159  LIPASE 53*   No results for input(s): "AMMONIA" in the last 168 hours. Coagulation Profile: Recent Labs  Lab 06/06/23 2159  INR 1.1   Cardiac Enzymes: No results for input(s): "CKTOTAL", "CKMB", "CKMBINDEX", "TROPONINI" in the last 168 hours. BNP (last 3 results) No results for input(s): "PROBNP" in the last 8760 hours. HbA1C: No results for input(s): "HGBA1C" in the last 72 hours. CBG: Recent Labs  Lab 06/07/23 0759 06/07/23 1220  GLUCAP 139* 154*   Lipid Profile: No results for input(s): "CHOL", "HDL", "LDLCALC", "TRIG", "CHOLHDL", "LDLDIRECT" in the last 72 hours. Thyroid Function Tests: No results for input(s): "TSH", "T4TOTAL", "FREET4", "T3FREE", "THYROIDAB" in the last 72 hours. Anemia Panel: No results for input(s): "VITAMINB12", "FOLATE", "FERRITIN", "TIBC", "IRON", "RETICCTPCT" in the last 72 hours. Most Recent Urinalysis On File:     Component Value Date/Time   COLORURINE AMBER (A) 06/06/2023 2158   APPEARANCEUR TURBID (A) 06/06/2023 2158   LABSPEC 1.016 06/06/2023 2158   PHURINE 5.0 06/06/2023 2158   GLUCOSEU NEGATIVE 06/06/2023 2158   HGBUR NEGATIVE 06/06/2023 2158   BILIRUBINUR NEGATIVE 06/06/2023 2158   KETONESUR NEGATIVE 06/06/2023 2158   PROTEINUR 100 (A) 06/06/2023 2158   NITRITE NEGATIVE 06/06/2023 2158   LEUKOCYTESUR MODERATE (A) 06/06/2023 2158   Sepsis Labs: @LABRCNTIP (procalcitonin:4,lacticidven:4) Microbiology: Recent Results (from the past 240 hours)  Blood Culture (routine x 2)     Status: None (Preliminary result)   Collection Time: 06/06/23  9:50 PM    Specimen: BLOOD  Result Value Ref Range Status   Specimen Description BLOOD BLOOD LEFT ARM  Final   Special Requests   Final    BOTTLES DRAWN AEROBIC ONLY Blood Culture results may not be optimal due to an inadequate volume of blood received in culture bottles   Culture   Final    NO GROWTH < 12 HOURS Performed at [facility], [address] [place], [place] [address]    Report Status PENDING  Incomplete  Blood Culture (routine x 2)     Status: None (Preliminary result)   Collection Time: 06/06/23  9:58 PM   Specimen: BLOOD  Result Value Ref Range Status   Specimen Description BLOOD RAC  Final   Special Requests   Final    BOTTLES DRAWN AEROBIC ONLY Blood Culture results may not be optimal due to an inadequate volume of blood received in culture bottles   Culture   Final    NO GROWTH < 12 HOURS Performed at [facility], [address] [place], [place] [address]    Report Status PENDING  Incomplete  Resp panel by RT-PCR (RSV, Flu A&B, Covid) Anterior Nasal Swab     Status: None   Collection Time: 06/06/23  9:59 PM   Specimen: Anterior Nasal Swab  Result Value Ref Range Status   SARS Coronavirus 2 by RT PCR NEGATIVE NEGATIVE Final    Comment: (NOTE) SARS-CoV-2 target nucleic acids are NOT DETECTED.  The SARS-CoV-2 RNA is generally detectable in upper respiratory specimens during the acute phase of infection. The lowest concentration of SARS-CoV-2 viral copies this assay can detect is 138 copies/mL. A negative result does not preclude SARS-Cov-2 infection and should not be used as the sole basis for treatment or other patient management decisions. A negative result  may occur with  improper specimen collection/handling, submission of specimen other than nasopharyngeal swab, presence of viral mutation(s) within the areas targeted by this assay, and inadequate number of viral copies(<138 copies/mL). A negative result must be combined with clinical  observations, patient history, and epidemiological information. The expected result is Negative.  Fact Sheet for Patients:  BloggerCourse.com  Fact Sheet for Healthcare Providers:  SeriousBroker.it  This test is no t yet approved or cleared by the [place] FDA and  has been authorized for detection and/or diagnosis of SARS-CoV-2 by FDA under an Emergency Use Authorization (EUA). This EUA will remain  in effect (meaning this test can be used) for the duration of the COVID-19 declaration under Section 564(b)(1) of the Act, 21 U.S.C.section 360bbb-3(b)(1), unless the authorization is terminated  or revoked sooner.       Influenza A by PCR NEGATIVE NEGATIVE Final   Influenza B by PCR NEGATIVE NEGATIVE Final    Comment: (NOTE) The Xpert Xpress SARS-CoV-2/FLU/RSV plus assay is intended as an aid in the diagnosis of influenza from Nasopharyngeal swab specimens and should not be used as a sole basis for treatment. Nasal washings and aspirates are unacceptable for Xpert Xpress SARS-CoV-2/FLU/RSV testing.  Fact Sheet for Patients: BloggerCourse.com  Fact Sheet for Healthcare Providers: SeriousBroker.it  This test is not yet approved or cleared by the [place] FDA and has been authorized for detection and/or diagnosis of SARS-CoV-2 by FDA under an Emergency Use Authorization (EUA). This EUA will remain in effect (meaning this test can be used) for the duration of the COVID-19 declaration under Section 564(b)(1) of the Act, 21 U.S.C. section 360bbb-3(b)(1), unless the authorization is terminated or revoked.     Resp Syncytial Virus by PCR NEGATIVE NEGATIVE Final    Comment: (NOTE) Fact Sheet for Patients: BloggerCourse.com  Fact Sheet for Healthcare Providers: SeriousBroker.it  This test is not yet approved or cleared by  the [place] FDA and has been authorized for detection and/or diagnosis of SARS-CoV-2 by FDA under an Emergency Use Authorization (EUA). This EUA will remain in effect (meaning this test can be used) for the duration of the COVID-19 declaration under Section 564(b)(1) of the Act, 21 U.S.C. section 360bbb-3(b)(1), unless the authorization is terminated or revoked.  Performed at [facility], [address], [place], [place] [address]       Radiology Studies last 3 days: CT ABDOMEN PELVIS WO CONTRAST Result Date: 06/07/2023 CLINICAL DATA:  Recent sepsis. Shortness of breath and altered mental status. Nausea and vomiting. EXAM: CT ABDOMEN AND PELVIS WITHOUT CONTRAST TECHNIQUE: Multidetector CT imaging of the abdomen and pelvis was performed following the standard protocol without IV contrast. RADIATION DOSE REDUCTION: This exam was performed according to the departmental dose-optimization program which includes automated exposure control, adjustment of the mA and/or kV according to patient size and/or use of iterative reconstruction technique. COMPARISON:  02/27/2023 FINDINGS: Lower chest: Diffuse patchy and nodular consolidative opacity with areas of ground-glass density noted in both lungs. Confluent consolidative disease is seen in the posterior right lower lobe. Fluid noted distal esophagus. Hepatobiliary: No suspicious focal abnormality within the liver parenchyma. Nodular contour raises the question of cirrhosis. 2 cm calcified gallstone evident. No intrahepatic or extrahepatic biliary dilation. Pancreas: No focal mass lesion. No dilatation of the main duct. No intraparenchymal cyst. No peripancreatic edema. Spleen: No splenomegaly. No suspicious focal mass lesion. Adrenals/Urinary Tract: No adrenal nodule or mass. Multiple nonobstructing stones in the right kidney measure up to 15 mm.  No stones are seen in the left kidney. No hydroureteronephrosis. Bladder is decompressed.  Stomach/Bowel: Stomach is unremarkable. No gastric wall thickening. No evidence of outlet obstruction. Gastrostomy tube tip is positioned in the gastric lumen. Duodenum is normally positioned as is the ligament of Treitz. No small bowel wall thickening. No small bowel dilatation. The terminal ileum is normal. The appendix is normal. No gross colonic mass. No colonic wall thickening. Diverticular changes are noted in the left colon without evidence of diverticulitis. Vascular/Lymphatic: There is mild atherosclerotic calcification of the abdominal aorta without aneurysm. IVC filter visualized. There is no gastrohepatic or hepatoduodenal ligament lymphadenopathy. No retroperitoneal or mesenteric lymphadenopathy. No pelvic sidewall lymphadenopathy. Reproductive: Hysterectomy.  There is no adnexal mass. Other: No intraperitoneal free fluid. Musculoskeletal: No worrisome lytic or sclerotic osseous abnormality. Areas of nodular soft tissue density in the subcutaneous fat of the anterior abdominal wall are likely related to injection sites. Small umbilical hernia contains only fat. IMPRESSION: 1. Diffuse patchy and nodular consolidative opacity with areas of ground-glass density in both lungs. Confluent consolidative disease in the posterior right lower lobe. Imaging features compatible with multifocal pneumonia. 2. No evidence for bowel obstruction. 3. Fluid in the distal esophagus compatible with gastroesophageal reflux. 4. Cholelithiasis. 5. Multiple nonobstructing right renal stones measure up to 15 mm. 6. Left colonic diverticulosis without diverticulitis. 7. Nodular contour of the liver raises the question of cirrhosis. 8.  Aortic Atherosclerosis (ICD10-I70.0). Electronically Signed   By: Kennith Center M.D.   On: 06/07/2023 06:23   DG Chest Port 1 View Result Date: 06/06/2023 CLINICAL DATA:  Altered level of consciousness, sepsis, COPD EXAM: PORTABLE CHEST 1 VIEW COMPARISON:  05/22/2023 FINDINGS: 2 frontal views of  the chest demonstrate stable right internal jugular dialysis catheter. The cardiac silhouette is unremarkable. There is increased pulmonary vascular congestion. Bilateral airspace disease, greatest at the lung bases left greater than right. No effusion or pneumothorax. No acute bony abnormalities. IMPRESSION: 1. Progressive multifocal bilateral airspace disease, greatest at the lung bases, which could reflect worsening pneumonia or edema. Electronically Signed   By: Sharlet Salina M.D.   On: 06/06/2023 22:51       Time spent: 50 min     Sunnie Nielsen, DO [facility] 06/07/2023, 1:47 PM    Dictation software may have been used to generate the above note. Typos may occur and escape review in typed/dictated notes. Please contact Dr Lyn Hollingshead directly for clarity if needed.  Staff may message me via secure chat in Epic  but this may not receive an immediate response,  please page me for urgent matters!  If 7PM-7AM, please contact night coverage [email or web address]

## 2023-06-07 NOTE — ED Notes (Signed)
 Pt reports having a BM. Pt rolled. Skin breakdown noted to bottom and back. Stage 1 and Stage 2 breakdown noted. Mirapex dressing applied. Checked rectal temp. Noted to be elevated. Ibuprofen given via g-tube for fever

## 2023-06-07 NOTE — Progress Notes (Signed)
       CROSS COVER NOTE  NAME: Alexandra Foster MRN: [id number] DOB : 02-11-1951 ATTENDING PHYSICIAN: Sunnie Nielsen, DO    Date of Service   06/07/2023   HPI/Events of Note   Notified of hypotension and fever 101.5 axillaryof on arrival to 2A Ms Kindig frequently hospitalized with infection and is chronically hypotensive secondary to adrenal dysfunction ESRD ect. Review of labs initially with great concern of septic shocj as well with lactic acid initially 8. Patient had not received any of her ordered hydrocortizone .  Interventions   Assessment/Plan:    06/07/2023    2:21 AM 06/07/2023    1:00 AM 06/07/2023   12:30 AM  Vitals with BMI  Height 5\' 4"     Weight 168 lbs 7 oz    BMI 28.9    Systolic 105 153 025  Diastolic 36 55 22  Pulse 90 87 90   On arrival to room patient with BIPAP in place satting 98% resp non labored. Good volumes. Alert, and recognized me from prior hospitalizations. Was able to tell me her left arm hurt as well as her throat but other than that ok.  Cooling blanket laying on patient, not properly cooling Left arm edema more pronounced above proximally into shoulder  VBG metabolic acidosis   CT abdomen pelvis changed to stat - results signif for multifocal PNA - on adequate coverage with cefepime, linezolid and flagyl  Liter of LR given with initial call of fever and hypotension 1 am sodium bicarb f/b  bicarb infusion with repeat vbg at noon Cbg monitoring q 4h U/s LUE         Donnie Mesa NP [facility] Cross Cover 7pm-7am - check amion for availability Pager [phone]

## 2023-06-07 NOTE — Progress Notes (Signed)
 Pharmacy Antibiotic Note  Alexandra Foster is a 73 y.o. female w/ ESRD on MWF HD, admitted on 06/06/2023 with HCAP.  Pharmacy has been consulted for Cefepime dosing.  Plan: Cefepime 1 gm q24hr per indication w/ ESRD on HD  Pharmacy will continue to follow and will adjust abx dosing whenever warranted.  Temp (24hrs), Avg:100.8 F (38.2 C), Min:98 F (36.7 C), Max:101.9 F (38.8 C)   Recent Labs  Lab 06/06/23 2159 06/07/23 0033  WBC 8.9  --   CREATININE 4.38*  --   LATICACIDVEN 8.0* 7.2*    Estimated Creatinine Clearance: 11.3 mL/min (A) (by C-G formula based on SCr of 4.38 mg/dL (H)).    Allergies  Allergen Reactions   Augmentin [Amoxicillin-Pot Clavulanate] Dermatitis   Chlorhexidine Dermatitis   Vancomycin Rash    Skin peeling    Zosyn [Piperacillin-Tazobactam In Dex] Rash    Antimicrobials this admission: 2/22 Cefepime >>  2/22 Linezolid >>  2/22 Flagyl >>   Microbiology results: 2/22 BCx: Pending 2/22 UCx: Pending   Thank you for allowing pharmacy to be a part of this patient's care.  Otelia Sergeant, PharmD, [facility] 06/07/2023 3:09 AM

## 2023-06-07 NOTE — Plan of Care (Signed)
  Problem: Respiratory: Goal: Ability to maintain adequate ventilation will improve Outcome: Progressing   Problem: Respiratory: Goal: Ability to maintain adequate ventilation will improve Outcome: Progressing   Problem: Respiratory: Goal: Ability to maintain a clear airway will improve Outcome: Progressing

## 2023-06-07 NOTE — Consult Note (Signed)
 WOC Nurse Consult Note: Reason for Consult: sacral wound and irritation under the breast Patient from home Gtube, ESRD, DM, COPD Wound type: Stage 3 Pressure Injury; sacrum Irritant Contact Dermatitis; inframammary  Pressure Injury POA: Yes Measurement: see nursing flow sheets Wound bed: see nursing flow sheets Drainage (amount, consistency, odor) see nursing flow sheets Periwound: intact  Dressing procedure/placement/frequency: Discussed with bedside nurse, reviewed measurements. Reported to be improved since prior admission, continue silicone foam for shallow wound with minimal drainage.  Add nystatin powder per nursing request for ICD.  Turn and reposition per hospital policy   Re consult if needed, will not follow at this time. Thanks  Carmella Kees [employer], RN,CWOCN, CNS, CWON-AP [phone])

## 2023-06-07 NOTE — Progress Notes (Signed)
 Hemodialysis note  Received patient in bed to unit. Alert and oriented.  Informed consent signed and in chart.  Tx duation: 2.5 hours. Rinsed back blood 30 mins early due to high TMP and time being bypassed. Dr. Cherylann Ratel was notified.    Patient tolerated well. Transported back to room, alert without acute distress.  Report given to patient's RN.   Access used: Right Chest HD PermCath Access issues: Activase was instilled for an hour due to both catheters not pulling well. BFR was decreased to 250 during tx and lines were reversed. Cathflo was intilled again after tx.  Total UF removed: 0 (due to hypotension) Medication(s) given:  none Post HD VS: BP: 92/58 Post HD weight: 77kg   [facility]

## 2023-06-07 NOTE — Progress Notes (Signed)
 [facility]  ROUNDING NOTE   Subjective:   Alexandra Foster is a 74 y.o. female with history of hypertension, coronary artery disease, congestive heart failure, COPD, cirrhosis, diabetes, status post left AKA, status post G-tube placement, and end stage renal disease on hemodialysis.  Patient presents to ED with altered mental status.  Patient has been admitted for ESRD on hemodialysis (HCC) [N18.6, Z99.2] HCAP (healthcare-associated pneumonia) [J18.9] Severe sepsis (HCC) [A41.9, R65.20]  Patient is known to our practice from previous admissions and receives outpatient dialysis treatments at [facility] on a TTS schedule, supervised by [facility].  She is seen laying In bed, family at bedside. Complains of being cold. Family confirms dialysis was received yesterday, unsure if it was a full treatment.   Labs on ED arrival unremarkable for renal patient. Lactic acid 8.0. Negative respiratory panel. Chest xray shows worsening pneumonia. Potassium also elevated 5.2 today.   We have been consulted to manage dialysis needs during this admission.    Objective:  Vital signs in last 24 hours:  Temp:  [98 F (36.7 C)-101.9 F (38.8 C)] 98.7 F (37.1 C) (02/23 0815) Pulse Rate:  [77-106] 77 (02/23 0645) Resp:  [19-35] 20 (02/23 0645) BP: (99-156)/(22-97) 127/97 (02/23 0645) SpO2:  [84 %-100 %] 95 % (02/23 0816) FiO2 (%):  [60 %-100 %] 60 % (02/23 0645) Weight:  [76.4 kg] 76.4 kg (02/23 0221)  Weight change:  Filed Weights   06/07/23 0221  Weight: 76.4 kg    Intake/Output: I/O last 3 completed shifts: In: 1414.4 [I.V.:1000; NG/GT:30; IV Piggyback:384.4] Out: -    Intake/Output this shift:  No intake/output data recorded.  Physical Exam: General: NAD  Head: Normocephalic, atraumatic. Moist oral mucosal membranes  Eyes: Anicteric  Lungs:  Crackles, normal effort, Rhine O2  Heart: Regular rate and rhythm  Abdomen:  Soft, nontender, obese  Extremities:   trace peripheral edema.  Neurologic: Alert and oriented to self, moving all four extremities  Skin: No lesions  Access: Rt chest permcath     Basic Metabolic Panel: Recent Labs  Lab 06/06/23 2159 06/07/23 0800  NA 135 133*  K 4.5 5.2*  CL 100 103  CO2 16* 12*  GLUCOSE 151* 132*  BUN 39* 42*  CREATININE 4.38* 4.52*  CALCIUM 9.3 8.4*    Liver Function Tests: Recent Labs  Lab 06/06/23 2159 06/07/23 0800  AST 90* 71*  ALT 39 39  ALKPHOS 118 75  BILITOT 1.3* 0.6  PROT 7.7 6.8  ALBUMIN 2.9* 2.5*   Recent Labs  Lab 06/06/23 2159  LIPASE 53*   No results for input(s): "AMMONIA" in the last 168 hours.  CBC: Recent Labs  Lab 06/06/23 2159 06/07/23 0509  WBC 8.9 11.2*  NEUTROABS 7.5  --   HGB 13.4 11.3*  HCT 44.7 38.3  MCV 88.2 89.3  PLT 317 246    Cardiac Enzymes: No results for input(s): "CKTOTAL", "CKMB", "CKMBINDEX", "TROPONINI" in the last 168 hours.  BNP: Invalid input(s): "POCBNP"  CBG: Recent Labs  Lab 06/07/23 0759  GLUCAP 139*    Microbiology: Results for orders placed or performed during the hospital encounter of 06/06/23  Blood Culture (routine x 2)     Status: None (Preliminary result)   Collection Time: 06/06/23  9:50 PM   Specimen: BLOOD  Result Value Ref Range Status   Specimen Description BLOOD BLOOD LEFT ARM  Final   Special Requests   Final    BOTTLES DRAWN AEROBIC ONLY Blood Culture results may  not be optimal due to an inadequate volume of blood received in culture bottles   Culture   Final    NO GROWTH < 12 HOURS Performed at [facility], [address] [place], [place] [address]    Report Status PENDING  Incomplete  Blood Culture (routine x 2)     Status: None (Preliminary result)   Collection Time: 06/06/23  9:58 PM   Specimen: BLOOD  Result Value Ref Range Status   Specimen Description BLOOD RAC  Final   Special Requests   Final    BOTTLES DRAWN AEROBIC ONLY Blood Culture results may not be optimal due to  an inadequate volume of blood received in culture bottles   Culture   Final    NO GROWTH < 12 HOURS Performed at [facility], [address] [place], [place] [address]    Report Status PENDING  Incomplete  Resp panel by RT-PCR (RSV, Flu A&B, Covid) Anterior Nasal Swab     Status: None   Collection Time: 06/06/23  9:59 PM   Specimen: Anterior Nasal Swab  Result Value Ref Range Status   SARS Coronavirus 2 by RT PCR NEGATIVE NEGATIVE Final    Comment: (NOTE) SARS-CoV-2 target nucleic acids are NOT DETECTED.  The SARS-CoV-2 RNA is generally detectable in upper respiratory specimens during the acute phase of infection. The lowest concentration of SARS-CoV-2 viral copies this assay can detect is 138 copies/mL. A negative result does not preclude SARS-Cov-2 infection and should not be used as the sole basis for treatment or other patient management decisions. A negative result may occur with  improper specimen collection/handling, submission of specimen other than nasopharyngeal swab, presence of viral mutation(s) within the areas targeted by this assay, and inadequate number of viral copies(<138 copies/mL). A negative result must be combined with clinical observations, patient history, and epidemiological information. The expected result is Negative.  Fact Sheet for Patients:  BloggerCourse.com  Fact Sheet for Healthcare Providers:  SeriousBroker.it  This test is no t yet approved or cleared by the [place] FDA and  has been authorized for detection and/or diagnosis of SARS-CoV-2 by FDA under an Emergency Use Authorization (EUA). This EUA will remain  in effect (meaning this test can be used) for the duration of the COVID-19 declaration under Section 564(b)(1) of the Act, 21 U.S.C.section 360bbb-3(b)(1), unless the authorization is terminated  or revoked sooner.       Influenza A by PCR NEGATIVE NEGATIVE Final    Influenza B by PCR NEGATIVE NEGATIVE Final    Comment: (NOTE) The Xpert Xpress SARS-CoV-2/FLU/RSV plus assay is intended as an aid in the diagnosis of influenza from Nasopharyngeal swab specimens and should not be used as a sole basis for treatment. Nasal washings and aspirates are unacceptable for Xpert Xpress SARS-CoV-2/FLU/RSV testing.  Fact Sheet for Patients: BloggerCourse.com  Fact Sheet for Healthcare Providers: SeriousBroker.it  This test is not yet approved or cleared by the [place] FDA and has been authorized for detection and/or diagnosis of SARS-CoV-2 by FDA under an Emergency Use Authorization (EUA). This EUA will remain in effect (meaning this test can be used) for the duration of the COVID-19 declaration under Section 564(b)(1) of the Act, 21 U.S.C. section 360bbb-3(b)(1), unless the authorization is terminated or revoked.     Resp Syncytial Virus by PCR NEGATIVE NEGATIVE Final    Comment: (NOTE) Fact Sheet for Patients: BloggerCourse.com  Fact Sheet for Healthcare Providers: SeriousBroker.it  This test is not yet approved or  cleared by the [place] and has been authorized for detection and/or diagnosis of SARS-CoV-2 by FDA under an Emergency Use Authorization (EUA). This EUA will remain in effect (meaning this test can be used) for the duration of the COVID-19 declaration under Section 564(b)(1) of the Act, 21 U.S.C. section 360bbb-3(b)(1), unless the authorization is terminated or revoked.  Performed at [facility], [address] [place], [place] [address]     Coagulation Studies: Recent Labs    06/06/23 2157-07-11  LABPROT 14.7  INR 1.1     Urinalysis: Recent Labs    06/06/23 07-11-56  COLORURINE AMBER*  LABSPEC 1.016  PHURINE 5.0  GLUCOSEU NEGATIVE  HGBUR NEGATIVE  BILIRUBINUR NEGATIVE  KETONESUR NEGATIVE  PROTEINUR 100*   NITRITE NEGATIVE  LEUKOCYTESUR MODERATE*       Imaging: CT ABDOMEN PELVIS WO CONTRAST Result Date: 06/07/2023 CLINICAL DATA:  Recent sepsis. Shortness of breath and altered mental status. Nausea and vomiting. EXAM: CT ABDOMEN AND PELVIS WITHOUT CONTRAST TECHNIQUE: Multidetector CT imaging of the abdomen and pelvis was performed following the standard protocol without IV contrast. RADIATION DOSE REDUCTION: This exam was performed according to the departmental dose-optimization program which includes automated exposure control, adjustment of the mA and/or kV according to patient size and/or use of iterative reconstruction technique. COMPARISON:  02/27/2023 FINDINGS: Lower chest: Diffuse patchy and nodular consolidative opacity with areas of ground-glass density noted in both lungs. Confluent consolidative disease is seen in the posterior right lower lobe. Fluid noted distal esophagus. Hepatobiliary: No suspicious focal abnormality within the liver parenchyma. Nodular contour raises the question of cirrhosis. 2 cm calcified gallstone evident. No intrahepatic or extrahepatic biliary dilation. Pancreas: No focal mass lesion. No dilatation of the main duct. No intraparenchymal cyst. No peripancreatic edema. Spleen: No splenomegaly. No suspicious focal mass lesion. Adrenals/Urinary Tract: No adrenal nodule or mass. Multiple nonobstructing stones in the right kidney measure up to 15 mm. No stones are seen in the left kidney. No hydroureteronephrosis. Bladder is decompressed. Stomach/Bowel: Stomach is unremarkable. No gastric wall thickening. No evidence of outlet obstruction. Gastrostomy tube tip is positioned in the gastric lumen. Duodenum is normally positioned as is the ligament of Treitz. No small bowel wall thickening. No small bowel dilatation. The terminal ileum is normal. The appendix is normal. No gross colonic mass. No colonic wall thickening. Diverticular changes are noted in the left colon without  evidence of diverticulitis. Vascular/Lymphatic: There is mild atherosclerotic calcification of the abdominal aorta without aneurysm. IVC filter visualized. There is no gastrohepatic or hepatoduodenal ligament lymphadenopathy. No retroperitoneal or mesenteric lymphadenopathy. No pelvic sidewall lymphadenopathy. Reproductive: Hysterectomy.  There is no adnexal mass. Other: No intraperitoneal free fluid. Musculoskeletal: No worrisome lytic or sclerotic osseous abnormality. Areas of nodular soft tissue density in the subcutaneous fat of the anterior abdominal wall are likely related to injection sites. Small umbilical hernia contains only fat. IMPRESSION: 1. Diffuse patchy and nodular consolidative opacity with areas of ground-glass density in both lungs. Confluent consolidative disease in the posterior right lower lobe. Imaging features compatible with multifocal pneumonia. 2. No evidence for bowel obstruction. 3. Fluid in the distal esophagus compatible with gastroesophageal reflux. 4. Cholelithiasis. 5. Multiple nonobstructing right renal stones measure up to 15 mm. 6. Left colonic diverticulosis without diverticulitis. 7. Nodular contour of the liver raises the question of cirrhosis. 8.  Aortic Atherosclerosis (ICD10-I70.0). Electronically Signed   By: Kennith Center M.D.   On: 06/07/2023 06:23   DG Chest Port 1 View Result Date:  06/06/2023 CLINICAL DATA:  Altered level of consciousness, sepsis, COPD EXAM: PORTABLE CHEST 1 VIEW COMPARISON:  05/22/2023 FINDINGS: 2 frontal views of the chest demonstrate stable right internal jugular dialysis catheter. The cardiac silhouette is unremarkable. There is increased pulmonary vascular congestion. Bilateral airspace disease, greatest at the lung bases left greater than right. No effusion or pneumothorax. No acute bony abnormalities. IMPRESSION: 1. Progressive multifocal bilateral airspace disease, greatest at the lung bases, which could reflect worsening pneumonia or edema.  Electronically Signed   By: Sharlet Salina M.D.   On: 06/06/2023 22:51      Medications:    ceFEPime (MAXIPIME) IV     linezolid (ZYVOX) IV 600 mg (06/07/23 1001)   metronidazole Stopped (06/07/23 0351)    ascorbic acid  250 mg Per Tube Daily   atorvastatin  20 mg Per Tube Daily   vitamin D3  2,000 Units Per Tube Daily   cholestyramine  4 g Per Tube TID   escitalopram  10 mg Per Tube Daily   famotidine  10 mg Per Tube Daily   feeding supplement (VITAL HIGH PROTEIN)  1,000 mL Per Tube Q24H   ferrous sulfate  60 mg of iron Per Tube Daily   [START ON 06/09/2023] fludrocortisone  0.2 mg Per Tube Daily   free water  30 mL Per Tube Q4H   heparin  5,000 Units Subcutaneous Q8H   hydrocortisone sod succinate (SOLU-CORTEF) inj  50 mg Intravenous Q8H   ipratropium-albuterol  3 mL Nebulization Q6H   midodrine  20 mg Per Tube TID WC   multivitamin  1 tablet Per Tube Q1400   mouth rinse  15 mL Mouth Rinse 4 times per day   albuterol, clonazePAM, dextromethorphan-guaiFENesin, hydrOXYzine, ibuprofen, ondansetron (ZOFRAN) IV, mouth rinse, phenol, traZODone  Assessment/ Plan:  [name] is a 73 y.o.  female with history of hypertension, coronary artery disease, congestive heart failure, COPD, cirrhosis, diabetes, status post left AKA, status post G-tube placement, and end stage renal disease on hemodialysis.  Patient presents to ED with altered mental status.  Patient has been admitted for ESRD on hemodialysis (HCC) [N18.6, Z99.2] HCAP (healthcare-associated pneumonia) [J18.9] Severe sepsis (HCC) [A41.9, R65.20]  CK [facility]/TTs/Rt permcath/75.6kg  End-stage renal disease with hyperkalemia: Unsure of length of last treatment on Saturday. Potassium 5.2. Will order IV Furosemide 80mg  once and Albumin 25g once. Will stop IVF. Will likely schedule short treatment on Monday, will evaluate in am.   #2.  Acute respiratory failure, requiring BIPAP. Now weaned to 6L. Crackles present  on exam. Will order IV furosemide as above and stop IVF.   #3: Anemia of chronic kidney disease Lab Results  Component Value Date   HGB 11.3 (L) 06/07/2023  Hemoglobin 11.3, within goal Continue low dose EPO with dialysis.    #4: Secondary Hyperparathyroidism: with outpatient labs: PTH 35, phosphorus 7.6, calcium 9.4 on 04/21/23.   Lab Results  Component Value Date   CALCIUM 8.4 (L) 06/07/2023   PHOS 6.6 (H) 05/26/2023    Will monitor bone minerals during this admission.   #5 Hypotension, patient has some history of hypotension, especially during dialysis. Currently prescribed Midodrine 20 mg three times daily. Will diurese with IV Furosemide and Albumin for Blood pressure support.    LOS: 0 Ramere Downs 2/23/202511:16 AM

## 2023-06-07 NOTE — IPAL (Signed)
  Interdisciplinary Goals of Care Family Meeting   Date carried out: 06/07/2023  Location of the meeting: Phone conference  Member's involved: Physician and Family Member or next of kin : daughter and husband   Durable Power of Pensions consultant or Environmental health practitioner: daughter and husband are in agreement, daughter is POA but this is not confirmed w/ documentation in chart review     Discussion: We discussed goals of care for [employer] .   I had discussed w/ family earlier re: patient is very weak, she is not medically critical at this time but I fear she may easily become so given her acute illness complicated by her underlying co-morbidities. Patient is not able to speak for herself, she is confused. I advised that I think CPR/intubation/ventilation would cause suffering without providing benefit to the patient, therefore I would recommend against CPR/intubation/ventilation in her case. I am hopeful that she will respond to treatment and her breathing will improve, acidosis will correct, etc but I am realistic that she may get worse instead of better. If she is decompensating despite treatment, such that death appears imminent, I would recommend administering medications to ease pain, ease air hunger, and ease anxiety and anticipate her passing. Daughter and husband are in agreement for DO NOT RESUSCITATE status while continuing with all other treatments possible.   Code status:   Code Status: DNR  Disposition: Continue current acute care with DNR status in place   Time spent for the meeting: 30 min     Sunnie Nielsen, DO  06/07/2023, 4:48 PM

## 2023-06-07 NOTE — Progress Notes (Addendum)
 HD NP was notified regarding patient's low blood pressure and ordered to start HD. Will keep UF off. Will monitor patient closely.   Red HD port is not pulling well. Blue port is pulling but stops every now and then. Both ports are flushing well. Lines were reversed and attempted HD.  1520: Machine keeps alarming for high arterial pressure. Rinsed back blood and instilled Cathflo on both HD ports. Dr. Cherylann Ratel was notified.  1639: HD restarted. Lines were reversed.  1715: (+) desaturation with increased work of breathing. Floor RN was notified. RT was notified by floor RN. Patient was placed on Bi-PAP.

## 2023-06-07 NOTE — ED Notes (Signed)
 Pt transported to floor

## 2023-06-07 NOTE — Consult Note (Signed)
 Please note that the [facility] nursing team is utilizing a standardized work plan to manage patient consults. We are triaging consults and will try to see the patients within 48 hours. Wound photos in the patient's chart allow [place] to consult on the patient in the most efficient and timely manner.    Guneet Delpino [facility], CNS, [employer] [phone]

## 2023-06-07 NOTE — Progress Notes (Signed)
 Patient A&O x 3. Able to make her needs known. Patient arrieved from ER with elevated Temp with soft B/P and elevated respirations. Jon Billings NP notified, see new orders through the shift. Patient transported to and from CT with charge Trinna Post RN, and RT Nadine Counts. Call light within reach

## 2023-06-08 ENCOUNTER — Inpatient Hospital Stay: Payer: Medicare Other

## 2023-06-08 DIAGNOSIS — J189 Pneumonia, unspecified organism: Secondary | ICD-10-CM | POA: Diagnosis not present

## 2023-06-08 LAB — COMPREHENSIVE METABOLIC PANEL
ALT: 33 U/L (ref 0–44)
AST: 63 U/L — ABNORMAL HIGH (ref 15–41)
Albumin: 2.7 g/dL — ABNORMAL LOW (ref 3.5–5.0)
Alkaline Phosphatase: 62 U/L (ref 38–126)
Anion gap: 17 — ABNORMAL HIGH (ref 5–15)
BUN: 36 mg/dL — ABNORMAL HIGH (ref 8–23)
CO2: 19 mmol/L — ABNORMAL LOW (ref 22–32)
Calcium: 8.6 mg/dL — ABNORMAL LOW (ref 8.9–10.3)
Chloride: 98 mmol/L (ref 98–111)
Creatinine, Ser: 3.66 mg/dL — ABNORMAL HIGH (ref 0.44–1.00)
GFR, Estimated: 12 mL/min — ABNORMAL LOW (ref >60)
Glucose, Bld: 130 mg/dL — ABNORMAL HIGH (ref 70–99)
Potassium: 4.4 mmol/L (ref 3.5–5.1)
Sodium: 134 mmol/L — ABNORMAL LOW (ref 135–145)
Total Bilirubin: 0.8 mg/dL (ref 0.0–1.2)
Total Protein: 6.7 g/dL (ref 6.5–8.1)

## 2023-06-08 LAB — BLOOD CULTURE ID PANEL (REFLEXED) - BCID2

## 2023-06-08 LAB — CBC
HCT: 32.6 % — ABNORMAL LOW (ref 36.0–46.0)
Hemoglobin: 10.3 g/dL — ABNORMAL LOW (ref 12.0–15.0)
MCH: 26.6 pg (ref 26.0–34.0)
MCHC: 31.6 g/dL (ref 30.0–36.0)
MCV: 84.2 fL (ref 80.0–100.0)
Platelets: 220 10*3/uL (ref 150–400)
RBC: 3.87 MIL/uL (ref 3.87–5.11)
RDW: 18.1 % — ABNORMAL HIGH (ref 11.5–15.5)
WBC: 13.6 10*3/uL — ABNORMAL HIGH (ref 4.0–10.5)
nRBC: 0.7 % — ABNORMAL HIGH (ref 0.0–0.2)

## 2023-06-08 LAB — GLUCOSE, CAPILLARY
Glucose-Capillary: 127 mg/dL — ABNORMAL HIGH (ref 70–99)
Glucose-Capillary: 132 mg/dL — ABNORMAL HIGH (ref 70–99)
Glucose-Capillary: 158 mg/dL — ABNORMAL HIGH (ref 70–99)

## 2023-06-08 MED ORDER — MORPHINE BOLUS VIA INFUSION
5.0000 mg | INTRAVENOUS | Status: DC | PRN
  Administered 2023-06-08 (×2): 5 mg via INTRAVENOUS

## 2023-06-08 MED ORDER — ONDANSETRON HCL 4 MG/2ML IJ SOLN
4.0000 mg | Freq: Four times a day (QID) | INTRAMUSCULAR | Status: DC | PRN
Start: 2023-06-08 — End: 2023-06-08

## 2023-06-08 MED ORDER — DIPHENHYDRAMINE HCL 50 MG/ML IJ SOLN: 25.0000 mg | INTRAMUSCULAR | Status: DC | PRN

## 2023-06-08 MED ORDER — GLYCOPYRROLATE 0.2 MG/ML IJ SOLN: 0.2000 mg | INTRAMUSCULAR | Status: DC | PRN

## 2023-06-08 MED ORDER — ACETAMINOPHEN 325 MG PO TABS: 650.0000 mg | ORAL_TABLET | Freq: Four times a day (QID) | ORAL | Status: DC | PRN

## 2023-06-08 MED ORDER — GLYCOPYRROLATE 1 MG PO TABS: 1.0000 mg | ORAL_TABLET | ORAL | Status: DC | PRN

## 2023-06-08 MED ORDER — HEPARIN SODIUM (PORCINE) 1000 UNIT/ML IJ SOLN
INTRAMUSCULAR | Status: AC
  Filled 2023-06-08: qty 10

## 2023-06-08 MED ORDER — ACETAMINOPHEN 650 MG RE SUPP: 650.0000 mg | Freq: Four times a day (QID) | RECTAL | Status: DC | PRN

## 2023-06-08 MED ORDER — POLYVINYL ALCOHOL 1.4 % OP SOLN: 1.0000 [drp] | Freq: Four times a day (QID) | OPHTHALMIC | Status: DC | PRN

## 2023-06-08 MED ORDER — HALOPERIDOL LACTATE 5 MG/ML IJ SOLN: 2.5000 mg | INTRAMUSCULAR | Status: DC | PRN

## 2023-06-08 MED ORDER — LORAZEPAM 2 MG/ML IJ SOLN: 2.0000 mg | INTRAMUSCULAR | Status: DC | PRN

## 2023-06-08 MED ORDER — ONDANSETRON 4 MG PO TBDP: 4.0000 mg | ORAL_TABLET | Freq: Four times a day (QID) | ORAL | Status: DC | PRN

## 2023-06-08 MED ORDER — MORPHINE 100MG IN NS 100ML (1MG/ML) PREMIX INFUSION
0.0000 mg/h | INTRAVENOUS | Status: DC
Start: 2023-06-08 — End: 2023-06-08
  Administered 2023-06-08: 1 mg/h via INTRAVENOUS
  Administered 2023-06-08: 5 mg/h via INTRAVENOUS
  Filled 2023-06-08: qty 100

## 2023-06-09 ENCOUNTER — Encounter: Payer: Self-pay | Admitting: *Deleted

## 2023-06-09 LAB — URINE CULTURE: Culture: 50000 — AB

## 2023-06-09 NOTE — Patient Outreach (Signed)
 Care Coordination   Follow Up Visit Note   06/09/2023 Name: Alexandra Foster MRN: [id number] DOB: [birth year]  Alexandra Foster is a 73 y.o. year old female who sees Alexandra Cory, MD for primary care.   Noted that patient readmitted to hospital on 2023-06-15, passed away on 17-Jun-2023.    Goals Addressed             This Visit's Progress    COMPLETED: care coordination activities       Interventions Today    Flowsheet Row Most Recent Value  General Interventions   General Interventions Discussed/Reviewed Communication with  Communication with --  [Request received for assistance with transportation-[employer] contacted, referral completed for transportation resources to medical appointments]           COMPLETED: Effective management of chronic medical conditions   Not on track    Interventions Today    Flowsheet Row Most Recent Value  Chronic Disease   Chronic disease during today's visit Chronic Kidney Disease/End Stage Renal Disease (ESRD), Congestive Heart Failure (CHF)  General Interventions   General Interventions Discussed/Reviewed General Interventions Reviewed, Doctor Visits, Level of Care  [Daughter report issue with information that was found in her MyChart, provided with the patient advocate information and steps to report a concern.]  Doctor Visits Discussed/Reviewed Doctor Visits Reviewed, PCP  [upcoming PCP 3/10]  PCP/Specialist Visits Compliance with follow-up visit  Level of Care Personal Care Services  [facility] started, but would like to change agencies as her current aide will be changing companies.  Contact information for [employer] emailed.]  Education Interventions   Education Provided Provided Education  Provided Verbal Education On When to see the doctor, Medication  [Medications reviewed.  Continue with HD sessions MWF, HH team remains active]              SDOH assessments and interventions completed:  No     Care Coordination Interventions:   No, not indicated   Follow up plan: No further intervention required.   Encounter Outcome:  Patient Visit Completed   Alexandra Langton, RN, MSN, CCM [place]  [facility], [facility] RN Care Coordinator Direct Dial: [phone] / Main [phone] Fax [phone] Email: [email or web address]: [email or web address]

## 2023-06-10 LAB — BLOOD GAS, VENOUS
Acid-base deficit: 12.6 mmol/L — ABNORMAL HIGH (ref 0.0–2.0)
Bicarbonate: 17.4 mmol/L — ABNORMAL LOW (ref 20.0–28.0)
O2 Saturation: 26.9 %
Patient temperature: 37
pCO2, Ven: 56 mm[Hg] (ref 44–60)
pH, Ven: 7.1 — CL (ref 7.25–7.43)

## 2023-06-10 LAB — CULTURE, BLOOD (ROUTINE X 2)
Culture  Setup Time: NO GROWTH
Culture  Setup Time: NO GROWTH

## 2023-06-13 NOTE — Death Summary Note (Signed)
 DEATH SUMMARY   Patient Details  Name: Alexandra Foster MRN: [id number] DOB: 1951-02-04 [id number], Alexandra Hefty, MD Admission/Discharge Information   Admit Date:  06-19-2023  Date of Death: Date of Death: 06/21/2023  Time of Death: Time of Death: 28-Jun-1719  Length of Stay: 1   Principle Cause of death: acute hypoxic respiratory failure (immediate cause), d/t severe sepsis, d/t multifocal pneumonia, d/t immune suppression on chronic steroid use and debility and adrenal insufficiency  Associated conditions: ESRD on dialysis, Diabetes Type 2, COPD, Atrial FIbrillation, HFpEF, OSA, former smoker   Hospital Diagnoses: Principal Problem:   HCAP (healthcare-associated pneumonia) Active Problems:   Acute hypoxic respiratory failure (HCC)   Aspiration pneumonia (HCC)   UTI (urinary tract infection)   Severe sepsis (HCC)   Acute metabolic encephalopathy   COPD (chronic obstructive pulmonary disease) (HCC)   ESRD on hemodialysis (HCC)   Atrial fibrillation and flutter (HCC)   Dyslipidemia   Adrenal insufficiency (HCC)   Chronic diastolic CHF (congestive heart failure) (HCC)   G tube feedings (HCC)   Nausea & vomiting   OSA (obstructive sleep apnea)   Anxiety and depression   Stage II decubitus ulcer [facility])    Hospital course / significant events:   HPI: Camylle Whicker is a 73 y.o. female with medical history significant of s/p of G-tube, ESRD-HD (TTS), diet-controled DM, dCHF, A fib not on AC due to hx of SAH, HDL, HTN, COPD/asthma, OSA on CPAP, s/p of left AKA, who presents with SOB and AMS. Pecently hospitalized from 1/29 - 2/12 due to severe sepsis secondary to aspiration pneumonia and Klebsiella UTI. Per her daughter, patient is confused   19-Jun-2023: to ED. SpO2 84% RA, respiratory distress, BiPAP started in ED. Nausea with 5 times of nonbilious nonbloody vomiting. WBC 8.9, procalcitonin 32.51, BNP  59.3, lactic acid 8.0 --> 7.2, troponin 11, negative PCR for COVID, flu and RSV, INR  1.1, PTT 26, abnormal liver function (ALP 118, AST 53, t98,22L.3), lipase 53, potassium 4.5, bicarbonate 16, creatinine 4.38, BUN 39, positive urinalysis (turbid appearance, moderate amount of leukocyte, rare bacteria, WBC > 50), chest x-ray showed multifocal infiltration.  02/23: admitted to hospitalist overnight for HCAP/aspiration pneumonia, UTI, severe sepsis. Abx Cefepime, Zyvox, (patient is allergic to vancomycin). Remains metabolic acidosis w/ increased WOB and confused, HD today and back on BiPap in the afternoon and to overnight. See IPAL note and discussion on code status below.  Jun 21, 2023:  agitated overnight responded to clonazepam. Tube site drainage, holding tube feeds. Remains on BiPap. HD this morning unable to complete - BP not tolerating, pt increased WOB even w/ BiPap. Decision made w/ family this afternoon for comfort measures. Pt placed on morphine protocol to treat air hunger and passed soon after coming off BiPap.     Consultants:  Nephrology   Procedures/Surgeries: none      ASSESSMENT & PLAN:   Comfort measures End of life care  D/c aggressive treatments, non-comfort medications/interventions, labs, etc  Morphine for pain/air hunger Benzo for anxiety/agitation Haldol if benzo ineffective Robunil prn secretions Acetaminophen prn fever  OK to d/c BiPAP once on morphine for 30 mins or so / once patient WOB eases on morphine  OK for Foley if needed OK for telemetry if desired but please alert central tele monitoring that patient is comfort measures and do not alert for abnormal rhythm, ok to alert if pt is in asystole >2 mins   Acute hypoxic respiratory failure due to HCAP (healthcare-associated pneumonia) and aspiration pneumonia -  POA  HCAP, aspiration pneumonia/pneumonitis  associated w/ severe sepsis - POA   UTI (urinary tract infection) associated w/ severe sepsis - POA   Metabolic acidosis  LUE edema  Acute metabolic encephalopathy, multifactorial d/t  sepsis, uremia, hypoxia/hypercarbia   COPD (chronic obstructive pulmonary disease)   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  May also be shock liver d/t hypotension/sepsis    OSA (obstructive sleep apnea)   Anxiety and depression   Stage II decubitus ulcer sacrum POA  ADVANCED CARE PLANNING See previous progress notes / IPAL    overweight based on BMI: Body mass index is 28.91 kg/m.  Underweight - under 18  overweight - 25 to 29 obese - 30 or more Class 1 obesity: BMI of 30.0 to 34 Class 2 obesity: BMI of 35.0 to 39 Class 3 obesity: BMI of 40.0 to 49 Super Morbid Obesity: BMI 50-59 Super-super Morbid Obesity: BMI 60+ Significantly low or high BMI is associated with higher medical risk.          The results of significant diagnostics from this hospitalization (including imaging, microbiology, ancillary and laboratory) are listed below for reference.   Significant Diagnostic Studies: DG ABDOMEN PEG TUBE LOCATION Result Date: 05/16/2023 CLINICAL DATA:  [id number] Leaking PEG tube (HCC) 045409 PEG TUBE VERIFICATION PEG TUBE LEAKING 50 ML OF GASTROGRAFIN INJECTED INTO PEG TUBE FLUSHED WITH SALINE LOT # [id number] EXP 04/2025 EXAM: ABDOMEN - 1 VIEW COMPARISON:  X-ray abdomen 05/13/2023 FINDINGS: IVC filter noted overlying the right mid abdomen. Gastrostomy tube overlies the right mid abdomen. PO contrast partially opacifies the gastric lumen and first and second portion of the duodenum. No radiographic finding of extravasation of PO contrast from the bowel. The bowel gas pattern is normal. No radio-opaque calculi or other significant radiographic abnormality are seen.  Partially visualized right chest wall dialysis catheter with tip overlying the expected region of the superior caval junction. IMPRESSION: 1. Gastrostomy tube in appropriate position. 2. Nonobstructive bowel gas pattern. Electronically Signed   By: Tish Frederickson M.D.   On: 06/01/2023 09:44   CT ABDOMEN PELVIS WO CONTRAST Result Date: 06/07/2023 CLINICAL DATA:  Recent sepsis. Shortness of breath and altered mental status. Nausea and vomiting. EXAM: CT ABDOMEN AND PELVIS WITHOUT CONTRAST TECHNIQUE: Multidetector CT imaging of the abdomen and pelvis was performed following the standard protocol without IV contrast. RADIATION DOSE REDUCTION: This exam was performed according to the departmental dose-optimization program which includes automated exposure control, adjustment of the mA and/or kV according to patient size and/or use of iterative reconstruction technique. COMPARISON:  02/27/2023 FINDINGS: Lower chest: Diffuse patchy and nodular consolidative opacity with areas of ground-glass density noted in both lungs. Confluent consolidative disease is seen in the posterior right lower lobe. Fluid noted distal esophagus. Hepatobiliary: No suspicious focal abnormality within the liver parenchyma. Nodular contour raises the question of cirrhosis. 2 cm calcified gallstone evident. No intrahepatic or extrahepatic biliary dilation. Pancreas: No focal mass lesion. No dilatation of the main duct. No intraparenchymal cyst. No peripancreatic edema. Spleen: No splenomegaly. No suspicious focal mass lesion. Adrenals/Urinary Tract: No adrenal nodule or mass. Multiple nonobstructing stones in the right kidney measure up to 15 mm. No stones are seen in the left  kidney. No hydroureteronephrosis. Bladder is decompressed. Stomach/Bowel: Stomach is unremarkable. No gastric wall thickening. No evidence of outlet obstruction. Gastrostomy tube tip is positioned in the gastric lumen. Duodenum is normally positioned as is the ligament of  Treitz. No small bowel wall thickening. No small bowel dilatation. The terminal ileum is normal. The appendix is normal. No gross colonic mass. No colonic wall thickening. Diverticular changes are noted in the left colon without evidence of diverticulitis. Vascular/Lymphatic: There is mild atherosclerotic calcification of the abdominal aorta without aneurysm. IVC filter visualized. There is no gastrohepatic or hepatoduodenal ligament lymphadenopathy. No retroperitoneal or mesenteric lymphadenopathy. No pelvic sidewall lymphadenopathy. Reproductive: Hysterectomy.  There is no adnexal mass. Other: No intraperitoneal free fluid. Musculoskeletal: No worrisome lytic or sclerotic osseous abnormality. Areas of nodular soft tissue density in the subcutaneous fat of the anterior abdominal wall are likely related to injection sites. Small umbilical hernia contains only fat. IMPRESSION: 1. Diffuse patchy and nodular consolidative opacity with areas of ground-glass density in both lungs. Confluent consolidative disease in the posterior right lower lobe. Imaging features compatible with multifocal pneumonia. 2. No evidence for bowel obstruction. 3. Fluid in the distal esophagus compatible with gastroesophageal reflux. 4. Cholelithiasis. 5. Multiple nonobstructing right renal stones measure up to 15 mm. 6. Left colonic diverticulosis without diverticulitis. 7. Nodular contour of the liver raises the question of cirrhosis. 8.  Aortic Atherosclerosis (ICD10-I70.0). Electronically Signed   By: Kennith Center M.D.   On: 06/07/2023 06:23   DG Chest Port 1 View Result Date: 06/06/2023 CLINICAL DATA:  Altered level of consciousness, sepsis, COPD EXAM: PORTABLE CHEST 1 VIEW COMPARISON:  05/22/2023 FINDINGS: 2 frontal views of the chest demonstrate stable right internal jugular dialysis catheter. The cardiac silhouette is unremarkable. There is increased pulmonary vascular congestion. Bilateral airspace disease, greatest at the lung  bases left greater than right. No effusion or pneumothorax. No acute bony abnormalities. IMPRESSION: 1. Progressive multifocal bilateral airspace disease, greatest at the lung bases, which could reflect worsening pneumonia or edema. Electronically Signed   By: Sharlet Salina M.D.   On: 06/06/2023 22:51   LONG TERM MONITOR (3-14 DAYS) Result Date: 06/04/2023 Patch Wear Time:  13 days and 6 hours (2025-01-22T12:45:23-0500 to 2025-02-04T19:39:27-0500) Patient had a min HR of 56 bpm, max HR of 125 bpm, and avg HR of 79 bpm. Predominant underlying rhythm was Sinus Rhythm. Bundle Branch Block/IVCD was present. Junctional Rhythm was present. Isolated SVEs were occasional (2.4%, 35495), SVE Couplets were rare (<1.0%, 2575), and no SVE Triplets were present. Isolated VEs were rare (<1.0%), and no VE Couplets or VE Triplets were present. Conclusion Average heart rate 79 bpm.  Minimal 56, max heart rate 125. Occasional PACs, 2.4% burden. No atrial fibrillation or atrial flutter. No significant or sustained arrhythmias.   DG Chest Port 1 View Result Date: 05/22/2023 CLINICAL DATA:  Altered mental status and leukocytosis. EXAM: PORTABLE CHEST 1 VIEW COMPARISON:  05/16/2023 FINDINGS: Stable heart size and appearance of tunneled dialysis catheter. Airspace disease of the right lung appears improved with some mild patchy asymmetric opacity remaining compared to the left lung. This could represent asymmetric edema of versus pneumonia. No pleural fluid or pneumothorax identified. The visualized skeletal structures are unremarkable. IMPRESSION: Improved airspace disease of the right lung with some mild patchy asymmetric opacity remaining compared to the left lung. This could represent asymmetric edema versus pneumonia. Electronically Signed   By: Irish Lack M.D.   On: 05/22/2023 13:37   CT Angio Chest Pulmonary Embolism (PE)  W or WO Contrast Result Date: 05/16/2023 CLINICAL DATA:  Shortness of breath. EXAM: CT ANGIOGRAPHY  CHEST WITH CONTRAST TECHNIQUE: Multidetector CT imaging of the chest was performed using the standard protocol during bolus administration of intravenous contrast. Multiplanar CT image reconstructions and MIPs were obtained to evaluate the vascular anatomy. RADIATION DOSE REDUCTION: This exam was performed according to the departmental dose-optimization program which includes automated exposure control, adjustment of the mA and/or kV according to patient size and/or use of iterative reconstruction technique. CONTRAST:  75mL OMNIPAQUE IOHEXOL 350 MG/ML SOLN COMPARISON:  February 27, 2023. FINDINGS: Cardiovascular: Satisfactory opacification of the pulmonary arteries to the segmental level. No evidence of pulmonary embolism. Normal heart size. No pericardial effusion. Mediastinum/Nodes: No enlarged mediastinal, hilar, or axillary lymph nodes. Thyroid gland, trachea, and esophagus demonstrate no significant findings. Lungs/Pleura: No pneumothorax or pleural effusion is noted. Significantly increased patchy airspace opacities are noted throughout the right lung and to a lesser degree in the left lung most consistent with worsening multifocal pneumonia. Upper Abdomen: Hepatic cirrhosis. Musculoskeletal: No chest wall abnormality. No acute or significant osseous findings. Review of the MIP images confirms the above findings. IMPRESSION: No definite evidence of pulmonary embolus. Significant increased patchy airspace opacities are noted throughout both lungs, right much greater than left, most consistent with worsening multifocal pneumonia. Hepatic cirrhosis. Electronically Signed   By: Lupita Raider M.D.   On: 05/16/2023 17:08   DG Chest Port 1 View Result Date: 05/16/2023 CLINICAL DATA:  Shortness of breath during dialysis EXAM: PORTABLE CHEST 1 VIEW COMPARISON:  05/12/2023 FINDINGS: Right IJ  dialysis catheter tip at low SVC. Midline trachea. Normal heart size for level of inspiration. Mild right hemidiaphragm  elevation. No pleural effusion or pneumothorax. Diffuse interstitial prominence. More confluent right mid and upper lung airspace disease is slightly more distinct today. Mild left base subsegmental atelectasis is unchanged. Numerous leads and wires project over the chest. IMPRESSION: Right mid and upper lung airspace disease is slightly more distinct today, favoring pneumonia over asymmetric pulmonary edema. Electronically Signed   By: Jeronimo Greaves M.D.   On: 05/16/2023 12:14   DG ABDOMEN PEG TUBE LOCATION Result Date: 05/13/2023 CLINICAL DATA:  G-tube check EXAM: ABDOMEN - 1 VIEW COMPARISON:  CT 05/13/2023 FINDINGS: Technologist notes indicate 30 cc Gastrografin administered through the percutaneous gastrostomy tube. There is contrast throughout the stomach. Gastrostomy tube in the distal stomach. IMPRESSION: 1. Percutaneous gastrostomy tube appears in proper location on AP view of the abdomen. 2. Injected contrast outlines the stomach. Electronically Signed   By: Genevive Bi M.D.   On: 05/13/2023 21:42   CT ANGIO UP EXTREM LEFT W &/OR WO CONTAST Result Date: 05/13/2023 CLINICAL DATA:  Decreased blood pressure in left arm compared to the right EXAM: CT ANGIOGRAPHY OF THE LEFT UPPEREXTREMITY TECHNIQUE: Multidetector CT imaging of the left upper extremitywas performed using the standard protocol during bolus administration of intravenous contrast. Multiplanar CT image reconstructions and MIPs were obtained to evaluate the vascular anatomy. RADIATION DOSE REDUCTION: This exam was performed according to the departmental dose-optimization program which includes automated exposure control, adjustment of the mA and/or kV according to patient size and/or use of iterative reconstruction technique. CONTRAST:  OMNIPAQUE IOHEXOL 350 MG/ML SOLN COMPARISON:  None Available. FINDINGS: Vascular: Thoracic aorta shows no aneurysmal dilatation or dissection. No cardiac enlargement is seen. The origins of the right  innominate, left common carotid and left subclavian arteries are all widely patent. The left subclavian artery and left axillary  artery are widely patent. Considerable artifact from the injected contrast bolus in the left arm is noted limiting the exam. The brachial artery appears patent although very obscured by the contrast bolus within the venous structures. The distal branches below the elbow are poorly visualized due to the timing of the contrast bolus. Pulmonary artery as visualized is within normal limits no pulmonary embolus is seen. The abdominal aorta is within normal limits. IVC filter is noted in satisfactory position. Dialysis catheter is noted in the right jugular vein. Nonvascular: Thoracic inlet is within normal limits. Scattered nodular changes are noted in the breasts bilaterally of uncertain significance. Correlation with prior mammography would be helpful. These were not present on prior CT in November of 2024. No hilar or mediastinal adenopathy is noted. The lungs demonstrate some patchy atelectatic changes bilaterally. Diffuse parenchymal densities are seen particularly in the right lung. This may represent some volume overload and edema. Possibility of underlying inflammatory change could not be totally excluded. Included structures in the abdomen show evidence of gastrostomy catheter in satisfactory position. Scattered diverticular change of the colon is noted without diverticulitis. Bony structures show degenerative change of the thoracolumbar spine. Review of the MIP images confirms the above findings. IMPRESSION: No findings to suggest left subclavian or axillary arterial stenosis are seen. The more distal arterial structures to include the brachial, radial and ulnar arteries are poorly visualized in part due to injection of contrast in the same arm as well as timing of the contrast bolus. Changes in lungs particularly on the right which may represent edema although the possibility of  underlying inflammatory change deserves consideration. Subcutaneous nodular changes in the breasts bilaterally not seen on prior exam in November of 2024. The need for further follow-up can be determined on a clinical basis. Electronically Signed   By: Alcide Clever M.D.   On: 05/13/2023 20:07   DG Chest 1 View Result Date: 05/12/2023 CLINICAL DATA:  Shortness of breath EXAM: CHEST  1 VIEW COMPARISON:  03/06/2023 FINDINGS: The patient is rotated to the right on today's radiograph, reducing diagnostic sensitivity and specificity. Dialysis catheter tip: Lower SVC. Hazy opacity projects over the right upper lobe. Some of this is rotational and cause by soft tissues of the chest wall and asymmetric inclusion of the scapula, but accounting for this possibility of right upper lobe bronchopneumonia or asymmetric edema is not excluded. Left lung appears clear. Thoracic spondylosis. IMPRESSION: 1. Hazy opacity projects over the right upper lobe. Some of this is rotational and cause by soft tissues of the chest wall and asymmetric inclusion of the scapula, but accounting for this possibility of right upper lobe bronchopneumonia or asymmetric edema is not excluded. 2. Dialysis catheter tip: Lower SVC. Electronically Signed   By: Gaylyn Rong M.D.   On: 05/12/2023 17:29    Microbiology: Recent Results (from the past 240 hours)  Blood Culture (routine x 2)     Status: None (Preliminary result)   Collection Time: 06/06/23  9:50 PM   Specimen: BLOOD LEFT ARM  Result Value Ref Range Status   Specimen Description   Final    BLOOD LEFT ARM Performed at [facility], [address], [place], [place] [address]    Special Requests   Final    BOTTLES DRAWN AEROBIC ONLY Blood Culture results may not be optimal due to an inadequate volume of blood received in culture bottles Performed at [facility], [address], [place], [place] [address]  Culture  Setup Time   Final    GRAM POSITIVE  COCCI AEROBIC BOTTLE ONLY CRITICAL RESULT CALLED TO, READ BACK BY AND VERIFIED WITH: Paschal Dopp PHARMD 1329 05/28/2023 HNM Performed at [facility], [address] [place], [place] [address]    Culture GRAM POSITIVE COCCI  Final   Report Status PENDING  Incomplete  Blood Culture ID Panel (Reflexed)     Status: Abnormal   Collection Time: 06/06/23  9:50 PM  Result Value Ref Range Status   Enterococcus faecalis NOT DETECTED NOT DETECTED Final   Enterococcus Faecium NOT DETECTED NOT DETECTED Final   Listeria monocytogenes NOT DETECTED NOT DETECTED Final   Staphylococcus species DETECTED (A) NOT DETECTED Final    Comment: CRITICAL RESULT CALLED TO, READ BACK BY AND VERIFIED WITH: Paschal Dopp PHARMD 1329 05/24/2023 HNM    Staphylococcus aureus (BCID) NOT DETECTED NOT DETECTED Final   Staphylococcus epidermidis DETECTED (A) NOT DETECTED Final    Comment: Methicillin (oxacillin) resistant coagulase negative staphylococcus. Possible blood culture contaminant (unless isolated from more than one blood culture draw or clinical case suggests pathogenicity). No antibiotic treatment is indicated for blood  culture contaminants. CRITICAL RESULT CALLED TO, READ BACK BY AND VERIFIED WITH: Paschal Dopp PHARMD 1329 05/19/2023 HNM    Staphylococcus lugdunensis NOT DETECTED NOT DETECTED Final   Streptococcus species NOT DETECTED NOT DETECTED Final   Streptococcus agalactiae NOT DETECTED NOT DETECTED Final   Streptococcus pneumoniae NOT DETECTED NOT DETECTED Final   Streptococcus pyogenes NOT DETECTED NOT DETECTED Final   A.calcoaceticus-baumannii NOT DETECTED NOT DETECTED Final   Bacteroides fragilis NOT DETECTED NOT DETECTED Final   Enterobacterales NOT DETECTED NOT DETECTED Final   Enterobacter cloacae complex NOT DETECTED NOT DETECTED Final   Escherichia coli NOT DETECTED NOT DETECTED Final   Klebsiella aerogenes NOT DETECTED NOT DETECTED Final   Klebsiella oxytoca NOT DETECTED NOT DETECTED Final    Klebsiella pneumoniae NOT DETECTED NOT DETECTED Final   Proteus species NOT DETECTED NOT DETECTED Final   Salmonella species NOT DETECTED NOT DETECTED Final   Serratia marcescens NOT DETECTED NOT DETECTED Final   Haemophilus influenzae NOT DETECTED NOT DETECTED Final   Neisseria meningitidis NOT DETECTED NOT DETECTED Final   Pseudomonas aeruginosa NOT DETECTED NOT DETECTED Final   Stenotrophomonas maltophilia NOT DETECTED NOT DETECTED Final   Candida albicans NOT DETECTED NOT DETECTED Final   Candida auris NOT DETECTED NOT DETECTED Final   Candida glabrata NOT DETECTED NOT DETECTED Final   Candida krusei NOT DETECTED NOT DETECTED Final   Candida parapsilosis NOT DETECTED NOT DETECTED Final   Candida tropicalis NOT DETECTED NOT DETECTED Final   Cryptococcus neoformans/gattii NOT DETECTED NOT DETECTED Final   Methicillin resistance mecA/C DETECTED (A) NOT DETECTED Final    Comment: CRITICAL RESULT CALLED TO, READ BACK BY AND VERIFIED WITHPaschal Dopp PHARMD 1329 06/04/2023 HNM Performed at [facility], [address] [place], [place] [address]   Blood Culture (routine x 2)     Status: None (Preliminary result)   Collection Time: 06/06/23  9:58 PM   Specimen: BLOOD  Result Value Ref Range Status   Specimen Description   Final    BLOOD RAC Performed at [facility], [address] [place], [place] [address]    Special Requests   Final    BOTTLES DRAWN AEROBIC ONLY Blood Culture results may not be optimal due to an inadequate volume of blood received in culture bottles Performed at [facility], [address] [place], [place] [address]    Culture  Setup Time   Final    GRAM POSITIVE COCCI AEROBIC BOTTLE ONLY CRITICAL VALUE NOTED.  VALUE IS CONSISTENT WITH PREVIOUSLY REPORTED AND CALLED VALUE. Performed at [facility], [address] [place], [place] [address]    Culture [facility] POSITIVE COCCI  Final   Report Status PENDING  Incomplete   Urine Culture     Status: Abnormal (Preliminary result)   Collection Time: 06/06/23  9:58 PM   Specimen: Urine, Random  Result Value Ref Range Status   Specimen Description   Final    URINE, RANDOM Performed at [facility], [address], [place], [place] [address]    Special Requests   Final    NONE Reflexed from [id number] Performed at [facility], [address], [place], [place] [address]    Culture 50,000 COLONIES/mL [facility] MORGANII (A)  Final   Report Status PENDING  Incomplete  Resp panel by RT-PCR (RSV, Flu A&B, Covid) Anterior Nasal Swab     Status: None   Collection Time: 06/06/23  9:59 PM   Specimen: Anterior Nasal Swab  Result Value Ref Range Status   SARS Coronavirus 2 by RT PCR NEGATIVE NEGATIVE Final    Comment: (NOTE) SARS-CoV-2 target nucleic acids are NOT DETECTED.  The SARS-CoV-2 RNA is generally detectable in upper respiratory specimens during the acute phase of infection. The lowest concentration of SARS-CoV-2 viral copies this assay can detect is 138 copies/mL. A negative result does not preclude SARS-Cov-2 infection and should not be used as the sole basis for treatment or other patient management decisions. A negative result may occur with  improper specimen collection/handling, submission of specimen other than nasopharyngeal swab, presence of viral mutation(s) within the areas targeted by this assay, and inadequate number of viral copies(<138 copies/mL). A negative result must be combined with clinical observations, patient history, and epidemiological information. The expected result is Negative.  Fact Sheet for Patients:  BloggerCourse.com  Fact Sheet for Healthcare Providers:  SeriousBroker.it  This test is no t yet approved or cleared by the [place] FDA and  has been authorized for detection and/or diagnosis of SARS-CoV-2 by FDA under an Emergency Use Authorization  (EUA). This EUA will remain  in effect (meaning this test can be used) for the duration of the COVID-19 declaration under Section 564(b)(1) of the Act, 21 U.S.C.section 360bbb-3(b)(1), unless the authorization is terminated  or revoked sooner.       Influenza A by PCR NEGATIVE NEGATIVE Final   Influenza B by PCR NEGATIVE NEGATIVE Final    Comment: (NOTE) The Xpert Xpress SARS-CoV-2/FLU/RSV plus assay is intended as an aid in the diagnosis of influenza from Nasopharyngeal swab specimens and should not be used as a sole basis for treatment. Nasal washings and aspirates are unacceptable for Xpert Xpress SARS-CoV-2/FLU/RSV testing.  Fact Sheet for Patients: BloggerCourse.com  Fact Sheet for Healthcare Providers: SeriousBroker.it  This test is not yet approved or cleared by the [place] FDA and has been authorized for detection and/or diagnosis of SARS-CoV-2 by FDA under an Emergency Use Authorization (EUA). This EUA will remain in effect (meaning this test can be used) for the duration of the COVID-19 declaration under Section 564(b)(1) of the Act, 21 U.S.C. section 360bbb-3(b)(1), unless the authorization is terminated or revoked.     Resp Syncytial Virus by PCR NEGATIVE NEGATIVE Final    Comment: (NOTE) Fact Sheet for Patients: BloggerCourse.com  Fact Sheet for  Healthcare Providers: SeriousBroker.it  This test is not yet approved or cleared by the [place] and has been authorized for detection and/or diagnosis of SARS-CoV-2 by FDA under an Emergency Use Authorization (EUA). This EUA will remain in effect (meaning this test can be used) for the duration of the COVID-19 declaration under Section 564(b)(1) of the Act, 21 U.S.C. section 360bbb-3(b)(1), unless the authorization is terminated or revoked.  Performed at [facility], [address]  [place], [place] [address]   Resp panel by RT-PCR (RSV, Flu A&B, Covid) Anterior Nasal Swab     Status: None   Collection Time: 06/07/23 12:45 PM   Specimen: Anterior Nasal Swab  Result Value Ref Range Status   SARS Coronavirus 2 by RT PCR NEGATIVE NEGATIVE Final    Comment: (NOTE) SARS-CoV-2 target nucleic acids are NOT DETECTED.  The SARS-CoV-2 RNA is generally detectable in upper respiratory specimens during the acute phase of infection. The lowest concentration of SARS-CoV-2 viral copies this assay can detect is 138 copies/mL. A negative result does not preclude SARS-Cov-2 infection and should not be used as the sole basis for treatment or other patient management decisions. A negative result may occur with  improper specimen collection/handling, submission of specimen other than nasopharyngeal swab, presence of viral mutation(s) within the areas targeted by this assay, and inadequate number of viral copies(<138 copies/mL). A negative result must be combined with clinical observations, patient history, and epidemiological information. The expected result is Negative.  Fact Sheet for Patients:  BloggerCourse.com  Fact Sheet for Healthcare Providers:  SeriousBroker.it  This test is no t yet approved or cleared by the [place] FDA and  has been authorized for detection and/or diagnosis of SARS-CoV-2 by FDA under an Emergency Use Authorization (EUA). This EUA will remain  in effect (meaning this test can be used) for the duration of the COVID-19 declaration under Section 564(b)(1) of the Act, 21 U.S.C.section 360bbb-3(b)(1), unless the authorization is terminated  or revoked sooner.       Influenza A by PCR NEGATIVE NEGATIVE Final   Influenza B by PCR NEGATIVE NEGATIVE Final    Comment: (NOTE) The Xpert Xpress SARS-CoV-2/FLU/RSV plus assay is intended as an aid in the diagnosis of influenza from Nasopharyngeal swab specimens  and should not be used as a sole basis for treatment. Nasal washings and aspirates are unacceptable for Xpert Xpress SARS-CoV-2/FLU/RSV testing.  Fact Sheet for Patients: BloggerCourse.com  Fact Sheet for Healthcare Providers: SeriousBroker.it  This test is not yet approved or cleared by the [place] FDA and has been authorized for detection and/or diagnosis of SARS-CoV-2 by FDA under an Emergency Use Authorization (EUA). This EUA will remain in effect (meaning this test can be used) for the duration of the COVID-19 declaration under Section 564(b)(1) of the Act, 21 U.S.C. section 360bbb-3(b)(1), unless the authorization is terminated or revoked.     Resp Syncytial Virus by PCR NEGATIVE NEGATIVE Final    Comment: (NOTE) Fact Sheet for Patients: BloggerCourse.com  Fact Sheet for Healthcare Providers: SeriousBroker.it  This test is not yet approved or cleared by the [place] FDA and has been authorized for detection and/or diagnosis of SARS-CoV-2 by FDA under an Emergency Use Authorization (EUA). This EUA will remain in effect (meaning this test can be used) for the duration of the COVID-19 declaration under Section 564(b)(1) of the Act, 21 U.S.C. section 360bbb-3(b)(1), unless the authorization is terminated or revoked.  Performed at [facility], [address]  [place], [place] [address]   Respiratory (~20 pathogens) panel by PCR     Status: None   Collection Time: 06/07/23 12:45 PM   Specimen: Nasopharyngeal Swab; Respiratory  Result Value Ref Range Status   Adenovirus NOT DETECTED NOT DETECTED Final   Coronavirus 229E NOT DETECTED NOT DETECTED Final    Comment: (NOTE) The Coronavirus on the Respiratory Panel, DOES NOT test for the novel  Coronavirus (2019 nCoV)    Coronavirus HKU1 NOT DETECTED NOT DETECTED Final   Coronavirus NL63 NOT DETECTED NOT  DETECTED Final   Coronavirus OC43 NOT DETECTED NOT DETECTED Final   Metapneumovirus NOT DETECTED NOT DETECTED Final   Rhinovirus / Enterovirus NOT DETECTED NOT DETECTED Final   Influenza A NOT DETECTED NOT DETECTED Final   Influenza B NOT DETECTED NOT DETECTED Final   Parainfluenza Virus 1 NOT DETECTED NOT DETECTED Final   Parainfluenza Virus 2 NOT DETECTED NOT DETECTED Final   Parainfluenza Virus 3 NOT DETECTED NOT DETECTED Final   Parainfluenza Virus 4 NOT DETECTED NOT DETECTED Final   Respiratory Syncytial Virus NOT DETECTED NOT DETECTED Final   Bordetella pertussis NOT DETECTED NOT DETECTED Final   Bordetella Parapertussis NOT DETECTED NOT DETECTED Final   Chlamydophila pneumoniae NOT DETECTED NOT DETECTED Final   Mycoplasma pneumoniae NOT DETECTED NOT DETECTED Final    Comment: Performed at [facility], [address] [place], [place] [address]  MRSA Next Gen by PCR, Nasal     Status: None   Collection Time: 06/07/23 12:45 PM   Specimen: Nasal Mucosa; Nasal Swab  Result Value Ref Range Status   MRSA by PCR Next Gen NOT DETECTED NOT DETECTED Final    Comment: (NOTE) The GeneXpert MRSA Assay (FDA approved for NASAL specimens only), is one component of a comprehensive MRSA colonization surveillance program. It is not intended to diagnose MRSA infection nor to guide or monitor treatment for MRSA infections. Test performance is not FDA approved in patients less than 51 years old. Performed at [facility], [address] [place], [place] [address]     Time spent: 15 minutes  Signed: Sunnie Nielsen, DO 06/09/23 7:25 AM

## 2023-06-13 NOTE — Progress Notes (Addendum)
 Hemodialysis note  Received patient in bed to unit. Patient is awake but looks critically ill and is on Bi-PAP.  Informed consent signed and in chart.   Patient was started on HD but was seen by Dr. Cherylann Ratel and HD NP after a few mins and they decided to terminate HD. Rinsed back patient's blood and floor RN was notified.  Patient is hypotensive and O2 sat was 88. RT was called and increased FiO2 to 60%. O2sat increased to 97%. Patient was transferred to room on full monitor and escorted by this RN and RT.  Access used: Right Chest HD catheter Access issues: none  Total UF removed: 0 Medication(s) given:  0 BP: 102/58   [facility]

## 2023-06-13 NOTE — Progress Notes (Signed)
 Patient has increased anxiety and agitation. Continues to take off BiPAP. PRN Klonopin  0.5 mg given as order, effective. Jawo NP notified for peg tube site drainage. Per Geradine Girt NP ok to stop tube feeding/and H2O flushes  until ABD XRAY confirms placement. Bed bath completed with soap and water. Patient tolerated well.

## 2023-06-13 NOTE — TOC Initial Note (Signed)
 Transition of Care [facility]) - Initial/Assessment Note    Patient Details  Name: Alexandra Foster MRN: [id number] Date of Birth: February 09, 1951  Transition of Care [facility]) CM/SW Contact:    Margarito Liner, LCSW Phone Number: 06/04/2023, 2:26 PM  Clinical Narrative:  Readmission prevention screen complete. Patient currently on continuous bipap. Numerous family members at bedside, including husband. PCP is [name], MD. She uses Medicaid Transportation to get to appointments. [facility]acy is [facility] [address]. No issues obtaining medications. Patient lives home with her husband. She is active with [facility] for PT, OT, RN. She has a Nurse, adult, wheelchair, hospital bed, and BSC at home. She is not on oxygen at home. No further concerns. CSW will continue to follow patient and her family for support and facilitate return home once stable.                Expected Discharge Plan: Home w Home Health Services Barriers to Discharge: Continued Medical Work up   Patient Goals and CMS Choice            Expected Discharge Plan and Services     Post Acute Care Choice: Resumption of Svcs/PTA Provider Living arrangements for the past 2 months: Single Family Home                           HH Arranged: RN, OT, PT HH Agency: [facility] Date HH Agency Contacted: 05/25/2023   Representative spoke with at [facility] Agency: [name]  Prior Living Arrangements/Services Living arrangements for the past 2 months: Single Family Home Lives with:: Spouse Patient language and need for interpreter reviewed:: Yes Do you feel safe going back to the place where you live?: Yes      Need for Family Participation in Patient Care: Yes (Comment) Care giver support system in place?: Yes (comment) Current home services: DME, Home PT, Home OT, Home RN Criminal Activity/Legal Involvement Pertinent to Current Situation/Hospitalization: No - Comment as needed  Activities of Daily  Living   ADL Screening (condition at time of admission) Independently performs ADLs?: No Does the patient have a NEW difficulty with bathing/dressing/toileting/self-feeding that is expected to last >3 days?: No Does the patient have a NEW difficulty with getting in/out of bed, walking, or climbing stairs that is expected to last >3 days?: No Does the patient have a NEW difficulty with communication that is expected to last >3 days?: No Is the patient deaf or have difficulty hearing?: No Does the patient have difficulty seeing, even when wearing glasses/contacts?: No Does the patient have difficulty concentrating, remembering, or making decisions?: No  Permission Sought/Granted Permission sought to share information with : Facility Medical sales representative, Family Supports                Emotional Assessment Appearance:: Appears stated age Attitude/Demeanor/Rapport: Unable to Assess Affect (typically observed): Unable to Assess Orientation: : Oriented to Self, Oriented to Place, Oriented to Situation Alcohol / Substance Use: Not Applicable Psych Involvement: No (comment)  Admission diagnosis:  ESRD on hemodialysis (HCC) [N18.6, Z99.2] HCAP (healthcare-associated pneumonia) [J18.9] Severe sepsis (HCC) [A41.9, R65.20] Patient Active Problem List   Diagnosis Date Noted   HCAP (healthcare-associated pneumonia) 06/07/2023   UTI (urinary tract infection) 06/07/2023   G tube feedings (HCC) 06/07/2023   Chronic diastolic CHF (congestive heart failure) (HCC) 06/07/2023   Nausea & vomiting 06/07/2023   Leukocytosis 05/26/2023   Thrombocytosis 05/26/2023   Altered mental  status 05/24/2023   Eosinophilia 05/23/2023   Acute on chronic diastolic CHF (congestive heart failure) (HCC) 05/16/2023   Urinary tract infection due to Klebsiella species 05/15/2023   Hyperkalemia 05/15/2023   Dyslipidemia 05/13/2023   Hypotension 05/13/2023   Itching 05/13/2023   Pneumonia due to infectious  organism 05/13/2023   Dyspnea 02/28/2023   Dyspnea on exertion 02/27/2023   Hx of AKA (above knee amputation), left (HCC) 02/27/2023   Truncal obesity 02/27/2023   Gastrostomy tube in place [facility]) 02/15/2023   Stage II decubitus ulcer (HCC) 02/15/2023   Septic shock (HCC) 02/14/2023   Aspiration pneumonia (HCC) 02/13/2023   Adrenal insufficiency (HCC) 02/13/2023   Malnutrition (HCC) 02/11/2023   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   Cholelithiasis without cholecystitis 02/11/2023   Bowel and bladder incontinence 02/11/2023   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   Diarrhea 12/28/2022   Dysphagia 12/26/2022   Anemia of chronic disease 12/24/2022   Obesity (BMI 30-39.9) 12/23/2022   Acute metabolic encephalopathy 12/06/2022   Anxiety and depression 12/06/2022   Type 2 diabetes mellitus with chronic kidney disease, with long-term current use of insulin (HCC) 12/06/2022   ESRD on hemodialysis (HCC) 12/06/2022   Subdural hemorrhage (HCC) 05/18/2021   Acute hypoxic respiratory failure (HCC) 05/12/2021   Atrial fibrillation and flutter (HCC)    Severe sepsis (HCC) 05/04/2021   Personal history of COVID-19 07/14/2019   Osteoarthritis of both knees 01/14/2018   Urge incontinence 03/14/2016   Osteopenia 02/13/2016   OSA (obstructive sleep apnea) 01/08/2016   Allergic rhinitis 04/13/2015   COPD (chronic obstructive pulmonary disease) (HCC) 09/18/2014   Esophagitis, reflux 09/18/2014   PCP:  [name], MD Pharmacy:   [facility] [address], [place] - [address] [place] [address] Phone: [phone] Fax: [phone]  [facility] [place] [facility] [address] [place] [address] Phone: [phone] Fax: [phone]     Social Drivers of Health (SDOH) Social History: SDOH Screenings   Food Insecurity: No Food Insecurity (06/07/2023)  Housing: Low Risk  (06/07/2023)   Transportation Needs: Unmet Transportation Needs (06/07/2023)  Utilities: Not At Risk (06/07/2023)  Alcohol Screen: Low Risk  (02/11/2021)  Depression (PHQ2-9): Low Risk  (03/20/2023)  Recent Concern: Depression (PHQ2-9) - Medium Risk (02/27/2023)  Financial Resource Strain: Medium Risk (08/09/2020)  Physical Activity: Inactive (08/09/2020)  Social Connections: Moderately Integrated (06/07/2023)  Stress: No Stress Concern Present (08/09/2020)  Tobacco Use: Medium Risk (06/07/2023)   SDOH Interventions:     Readmission Risk Interventions    06/04/2023    2:24 PM 02/16/2023   12:26 PM 12/26/2022   11:15 AM  Readmission Risk Prevention Plan  Transportation Screening Complete Complete Complete  PCP or Specialist Appt within 3-5 Days  Complete   HRI or Home Care Consult  Complete   Social Work Consult for Recovery Care Planning/Counseling  Complete   Palliative Care Screening  Not Applicable   Medication Review [employer]) Complete Complete Complete  PCP or Specialist appointment within 3-5 days of discharge Complete  Complete  HRI or Home Care Consult Complete  Complete  SW Recovery Care/Counseling Consult Complete  Complete  Palliative Care Screening Not Applicable  Not Applicable  Skilled Nursing Facility Not Applicable  Not Applicable

## 2023-06-13 NOTE — Progress Notes (Signed)
 [facility]  ROUNDING NOTE   Subjective:   Alexandra Foster is a 73 y.o. female with history of hypertension, coronary artery disease, congestive heart failure, COPD, cirrhosis, diabetes, status post left AKA, status post G-tube placement, and end stage renal disease on hemodialysis.  Patient presents to ED with altered mental status.  Patient has been admitted for ESRD on hemodialysis (HCC) [N18.6, Z99.2] HCAP (healthcare-associated pneumonia) [J18.9] Severe sepsis (HCC) [A41.9, R65.20]  Patient is known to our practice from previous admissions and receives outpatient dialysis treatments at [facility] on a TTS schedule, supervised by [facility].    Patient seen and evaluated during dialysis   HEMODIALYSIS FLOWSHEET:  Blood Flow Rate (mL/min): 0 mL/min Arterial Pressure (mmHg): -34.54 mmHg Venous Pressure (mmHg): 23.23 mmHg TMP (mmHg): 6.87 mmHg Ultrafiltration Rate (mL/min): 869 mL/min Dialysate Flow Rate (mL/min): 300 ml/min  Patient currently on BiPAP Hypotensive   Objective:  Vital signs in last 24 hours:  Temp:  [98.5 F (36.9 C)-99.4 F (37.4 C)] 98.7 F (37.1 C) (02/24 0845) Pulse Rate:  [84-101] 101 (02/24 0900) Resp:  [11-34] 34 (02/24 0900) BP: (72-104)/(39-71) 94/39 (02/24 0900) SpO2:  [88 %-99 %] 97 % (02/24 0900) FiO2 (%):  [40 %-60 %] 60 % (02/24 0900) Weight:  [77 kg-81.7 kg] 81.7 kg (02/24 0845)  Weight change: 0.7 kg Filed Weights   05/19/2023 0500 05/25/2023 0815 05/26/2023 0845  Weight: 81 kg 81.7 kg 81.7 kg    Intake/Output: I/O last 3 completed shifts: In: 2539.4 [I.V.:1000; NG/GT:1055; IV Piggyback:484.4] Out: 0    Intake/Output this shift:  No intake/output data recorded.  Physical Exam: General: NAD  Head: Normocephalic, atraumatic.   Eyes: Anicteric  Lungs:  Crackles, normal effort, BiPAP  Heart: Regular rate and rhythm  Abdomen:  Soft, nontender, obese  Extremities:  trace peripheral edema.  Neurologic:  Somnolent  Skin: No lesions  Access: Rt chest permcath     Basic Metabolic Panel: Recent Labs  Lab 06/06/23 2159 06/07/23 0800 05/21/2023 0646  NA 135 133* 134*  K 4.5 5.2* 4.4  CL 100 103 98  CO2 16* 12* 19*  GLUCOSE 151* 132* 130*  BUN 39* 42* 36*  CREATININE 4.38* 4.52* 3.66*  CALCIUM 9.3 8.4* 8.6*    Liver Function Tests: Recent Labs  Lab 06/06/23 2159 06/07/23 0800 05/21/2023 0646  AST 90* 71* 63*  ALT 39 39 33  ALKPHOS 118 75 62  BILITOT 1.3* 0.6 0.8  PROT 7.7 6.8 6.7  ALBUMIN 2.9* 2.5* 2.7*   Recent Labs  Lab 06/06/23 2159  LIPASE 53*   No results for input(s): "AMMONIA" in the last 168 hours.  CBC: Recent Labs  Lab 06/06/23 2159 06/07/23 0509 05/18/2023 0646  WBC 8.9 11.2* 13.6*  NEUTROABS 7.5  --   --   HGB 13.4 11.3* 10.3*  HCT 44.7 38.3 32.6*  MCV 88.2 89.3 84.2  PLT 317 246 220    Cardiac Enzymes: No results for input(s): "CKTOTAL", "CKMB", "CKMBINDEX", "TROPONINI" in the last 168 hours.  BNP: Invalid input(s): "POCBNP"  CBG: Recent Labs  Lab 06/07/23 0759 06/07/23 1220 06/07/23 2054 06/09/2023 0028 05/17/2023 0609  GLUCAP 139* 154* 127* 158* 127*    Microbiology: Results for orders placed or performed during the hospital encounter of 06/06/23  Blood Culture (routine x 2)     Status: None (Preliminary result)   Collection Time: 06/06/23  9:50 PM   Specimen: BLOOD  Result Value Ref Range Status   Specimen Description BLOOD BLOOD  LEFT ARM  Final   Special Requests   Final    BOTTLES DRAWN AEROBIC ONLY Blood Culture results may not be optimal due to an inadequate volume of blood received in culture bottles   Culture   Final    NO GROWTH 2 DAYS Performed at [facility], [address] [place], [place] [address]    Report Status PENDING  Incomplete  Blood Culture (routine x 2)     Status: None (Preliminary result)   Collection Time: 06/06/23  9:58 PM   Specimen: BLOOD  Result Value Ref Range Status   Specimen  Description BLOOD RAC  Final   Special Requests   Final    BOTTLES DRAWN AEROBIC ONLY Blood Culture results may not be optimal due to an inadequate volume of blood received in culture bottles   Culture   Final    NO GROWTH 2 DAYS Performed at [facility], [address] [place], [place] [address]    Report Status PENDING  Incomplete  Urine Culture     Status: None (Preliminary result)   Collection Time: 06/06/23  9:58 PM   Specimen: Urine, Random  Result Value Ref Range Status   Specimen Description   Final    URINE, RANDOM Performed at [facility], [address], [place], [place] [address]    Special Requests   Final    NONE Reflexed from [id number] Performed at [facility], [address], [place], [place] [address]    Culture   Final    CULTURE REINCUBATED FOR BETTER GROWTH Performed at [facility], [address] [place], [place] [address]    Report Status PENDING  Incomplete  Resp panel by RT-PCR (RSV, Flu A&B, Covid) Anterior Nasal Swab     Status: None   Collection Time: 06/06/23  9:59 PM   Specimen: Anterior Nasal Swab  Result Value Ref Range Status   SARS Coronavirus 2 by RT PCR NEGATIVE NEGATIVE Final    Comment: (NOTE) SARS-CoV-2 target nucleic acids are NOT DETECTED.  The SARS-CoV-2 RNA is generally detectable in upper respiratory specimens during the acute phase of infection. The lowest concentration of SARS-CoV-2 viral copies this assay can detect is 138 copies/mL. A negative result does not preclude SARS-Cov-2 infection and should not be used as the sole basis for treatment or other patient management decisions. A negative result may occur with  improper specimen collection/handling, submission of specimen other than nasopharyngeal swab, presence of viral mutation(s) within the areas targeted by this assay, and inadequate number of viral copies(<138 copies/mL). A negative result must be combined with clinical  observations, patient history, and epidemiological information. The expected result is Negative.  Fact Sheet for Patients:  BloggerCourse.com  Fact Sheet for Healthcare Providers:  SeriousBroker.it  This test is no t yet approved or cleared by the [place] FDA and  has been authorized for detection and/or diagnosis of SARS-CoV-2 by FDA under an Emergency Use Authorization (EUA). This EUA will remain  in effect (meaning this test can be used) for the duration of the COVID-19 declaration under Section 564(b)(1) of the Act, 21 U.S.C.section 360bbb-3(b)(1), unless the authorization is terminated  or revoked sooner.       Influenza A by PCR NEGATIVE NEGATIVE Final   Influenza B by PCR NEGATIVE NEGATIVE Final    Comment: (NOTE) The Xpert Xpress SARS-CoV-2/FLU/RSV plus assay is intended as an aid in the diagnosis of influenza from Nasopharyngeal swab specimens and should not  be used as a sole basis for treatment. Nasal washings and aspirates are unacceptable for Xpert Xpress SARS-CoV-2/FLU/RSV testing.  Fact Sheet for Patients: BloggerCourse.com  Fact Sheet for Healthcare Providers: SeriousBroker.it  This test is not yet approved or cleared by the [place] FDA and has been authorized for detection and/or diagnosis of SARS-CoV-2 by FDA under an Emergency Use Authorization (EUA). This EUA will remain in effect (meaning this test can be used) for the duration of the COVID-19 declaration under Section 564(b)(1) of the Act, 21 U.S.C. section 360bbb-3(b)(1), unless the authorization is terminated or revoked.     Resp Syncytial Virus by PCR NEGATIVE NEGATIVE Final    Comment: (NOTE) Fact Sheet for Patients: BloggerCourse.com  Fact Sheet for Healthcare Providers: SeriousBroker.it  This test is not yet approved or cleared by  the [place] FDA and has been authorized for detection and/or diagnosis of SARS-CoV-2 by FDA under an Emergency Use Authorization (EUA). This EUA will remain in effect (meaning this test can be used) for the duration of the COVID-19 declaration under Section 564(b)(1) of the Act, 21 U.S.C. section 360bbb-3(b)(1), unless the authorization is terminated or revoked.  Performed at [facility], [address] [place], [place] [address]   Resp panel by RT-PCR (RSV, Flu A&B, Covid) Anterior Nasal Swab     Status: None   Collection Time: 06/07/23 12:45 PM   Specimen: Anterior Nasal Swab  Result Value Ref Range Status   SARS Coronavirus 2 by RT PCR NEGATIVE NEGATIVE Final    Comment: (NOTE) SARS-CoV-2 target nucleic acids are NOT DETECTED.  The SARS-CoV-2 RNA is generally detectable in upper respiratory specimens during the acute phase of infection. The lowest concentration of SARS-CoV-2 viral copies this assay can detect is 138 copies/mL. A negative result does not preclude SARS-Cov-2 infection and should not be used as the sole basis for treatment or other patient management decisions. A negative result may occur with  improper specimen collection/handling, submission of specimen other than nasopharyngeal swab, presence of viral mutation(s) within the areas targeted by this assay, and inadequate number of viral copies(<138 copies/mL). A negative result must be combined with clinical observations, patient history, and epidemiological information. The expected result is Negative.  Fact Sheet for Patients:  BloggerCourse.com  Fact Sheet for Healthcare Providers:  SeriousBroker.it  This test is no t yet approved or cleared by the [place] FDA and  has been authorized for detection and/or diagnosis of SARS-CoV-2 by FDA under an Emergency Use Authorization (EUA). This EUA will remain  in effect (meaning this test can  be used) for the duration of the COVID-19 declaration under Section 564(b)(1) of the Act, 21 U.S.C.section 360bbb-3(b)(1), unless the authorization is terminated  or revoked sooner.       Influenza A by PCR NEGATIVE NEGATIVE Final   Influenza B by PCR NEGATIVE NEGATIVE Final    Comment: (NOTE) The Xpert Xpress SARS-CoV-2/FLU/RSV plus assay is intended as an aid in the diagnosis of influenza from Nasopharyngeal swab specimens and should not be used as a sole basis for treatment. Nasal washings and aspirates are unacceptable for Xpert Xpress SARS-CoV-2/FLU/RSV testing.  Fact Sheet for Patients: BloggerCourse.com  Fact Sheet for Healthcare Providers: SeriousBroker.it  This test is not yet approved or cleared by the [place] FDA and has been authorized for detection and/or diagnosis of SARS-CoV-2 by FDA under an Emergency Use Authorization (EUA). This EUA will remain in effect (meaning this test can be used) for the duration of the  COVID-19 declaration under Section 564(b)(1) of the Act, 21 U.S.C. section 360bbb-3(b)(1), unless the authorization is terminated or revoked.     Resp Syncytial Virus by PCR NEGATIVE NEGATIVE Final    Comment: (NOTE) Fact Sheet for Patients: BloggerCourse.com  Fact Sheet for Healthcare Providers: SeriousBroker.it  This test is not yet approved or cleared by the [place] FDA and has been authorized for detection and/or diagnosis of SARS-CoV-2 by FDA under an Emergency Use Authorization (EUA). This EUA will remain in effect (meaning this test can be used) for the duration of the COVID-19 declaration under Section 564(b)(1) of the Act, 21 U.S.C. section 360bbb-3(b)(1), unless the authorization is terminated or revoked.  Performed at [facility], [address] [place], [place] [address]   Respiratory (~20 pathogens) panel by PCR      Status: None   Collection Time: 06/07/23 12:45 PM   Specimen: Nasopharyngeal Swab; Respiratory  Result Value Ref Range Status   Adenovirus NOT DETECTED NOT DETECTED Final   Coronavirus 229E NOT DETECTED NOT DETECTED Final    Comment: (NOTE) The Coronavirus on the Respiratory Panel, DOES NOT test for the novel  Coronavirus Jun 17, 2017 nCoV)    Coronavirus HKU1 NOT DETECTED NOT DETECTED Final   Coronavirus NL63 NOT DETECTED NOT DETECTED Final   Coronavirus OC43 NOT DETECTED NOT DETECTED Final   Metapneumovirus NOT DETECTED NOT DETECTED Final   Rhinovirus / Enterovirus NOT DETECTED NOT DETECTED Final   Influenza A NOT DETECTED NOT DETECTED Final   Influenza B NOT DETECTED NOT DETECTED Final   Parainfluenza Virus 1 NOT DETECTED NOT DETECTED Final   Parainfluenza Virus 2 NOT DETECTED NOT DETECTED Final   Parainfluenza Virus 3 NOT DETECTED NOT DETECTED Final   Parainfluenza Virus 4 NOT DETECTED NOT DETECTED Final   Respiratory Syncytial Virus NOT DETECTED NOT DETECTED Final   Bordetella pertussis NOT DETECTED NOT DETECTED Final   Bordetella Parapertussis NOT DETECTED NOT DETECTED Final   Chlamydophila pneumoniae NOT DETECTED NOT DETECTED Final   Mycoplasma pneumoniae NOT DETECTED NOT DETECTED Final    Comment: Performed at [facility], [address] [place], [place] [address]  MRSA Next Gen by PCR, Nasal     Status: None   Collection Time: 06/07/23 12:45 PM   Specimen: Nasal Mucosa; Nasal Swab  Result Value Ref Range Status   MRSA by PCR Next Gen NOT DETECTED NOT DETECTED Final    Comment: (NOTE) The GeneXpert MRSA Assay (FDA approved for NASAL specimens only), is one component of a comprehensive MRSA colonization surveillance program. It is not intended to diagnose MRSA infection nor to guide or monitor treatment for MRSA infections. Test performance is not FDA approved in patients less than 51 years old. Performed at [facility], [address]  [place], [place] [address]     Coagulation Studies: Recent Labs    06/06/23 2157-06-17  LABPROT 14.7  INR 1.1     Urinalysis: Recent Labs    06/06/23 17-Jun-2156  COLORURINE AMBER*  LABSPEC 1.016  PHURINE 5.0  GLUCOSEU NEGATIVE  HGBUR NEGATIVE  BILIRUBINUR NEGATIVE  KETONESUR NEGATIVE  PROTEINUR 100*  NITRITE NEGATIVE  LEUKOCYTESUR MODERATE*       Imaging: DG ABDOMEN PEG TUBE LOCATION Result Date: 05/29/2023 CLINICAL DATA:  [id number] Leaking PEG tube (HCC) 664403 PEG TUBE VERIFICATION PEG TUBE LEAKING 50 ML OF GASTROGRAFIN INJECTED INTO PEG TUBE FLUSHED WITH SALINE LOT # [id number] EXP 04/2025 EXAM: ABDOMEN - 1 VIEW COMPARISON:  X-ray abdomen 05/13/2023 FINDINGS: IVC  filter noted overlying the right mid abdomen. Gastrostomy tube overlies the right mid abdomen. PO contrast partially opacifies the gastric lumen and first and second portion of the duodenum. No radiographic finding of extravasation of PO contrast from the bowel. The bowel gas pattern is normal. No radio-opaque calculi or other significant radiographic abnormality are seen. Partially visualized right chest wall dialysis catheter with tip overlying the expected region of the superior caval junction. IMPRESSION: 1. Gastrostomy tube in appropriate position. 2. Nonobstructive bowel gas pattern. Electronically Signed   By: Tish Frederickson M.D.   On: 05/29/2023 09:44   CT ABDOMEN PELVIS WO CONTRAST Result Date: 06/07/2023 CLINICAL DATA:  Recent sepsis. Shortness of breath and altered mental status. Nausea and vomiting. EXAM: CT ABDOMEN AND PELVIS WITHOUT CONTRAST TECHNIQUE: Multidetector CT imaging of the abdomen and pelvis was performed following the standard protocol without IV contrast. RADIATION DOSE REDUCTION: This exam was performed according to the departmental dose-optimization program which includes automated exposure control, adjustment of the mA and/or kV according to patient size and/or use of iterative reconstruction technique.  COMPARISON:  02/27/2023 FINDINGS: Lower chest: Diffuse patchy and nodular consolidative opacity with areas of ground-glass density noted in both lungs. Confluent consolidative disease is seen in the posterior right lower lobe. Fluid noted distal esophagus. Hepatobiliary: No suspicious focal abnormality within the liver parenchyma. Nodular contour raises the question of cirrhosis. 2 cm calcified gallstone evident. No intrahepatic or extrahepatic biliary dilation. Pancreas: No focal mass lesion. No dilatation of the main duct. No intraparenchymal cyst. No peripancreatic edema. Spleen: No splenomegaly. No suspicious focal mass lesion. Adrenals/Urinary Tract: No adrenal nodule or mass. Multiple nonobstructing stones in the right kidney measure up to 15 mm. No stones are seen in the left kidney. No hydroureteronephrosis. Bladder is decompressed. Stomach/Bowel: Stomach is unremarkable. No gastric wall thickening. No evidence of outlet obstruction. Gastrostomy tube tip is positioned in the gastric lumen. Duodenum is normally positioned as is the ligament of Treitz. No small bowel wall thickening. No small bowel dilatation. The terminal ileum is normal. The appendix is normal. No gross colonic mass. No colonic wall thickening. Diverticular changes are noted in the left colon without evidence of diverticulitis. Vascular/Lymphatic: There is mild atherosclerotic calcification of the abdominal aorta without aneurysm. IVC filter visualized. There is no gastrohepatic or hepatoduodenal ligament lymphadenopathy. No retroperitoneal or mesenteric lymphadenopathy. No pelvic sidewall lymphadenopathy. Reproductive: Hysterectomy.  There is no adnexal mass. Other: No intraperitoneal free fluid. Musculoskeletal: No worrisome lytic or sclerotic osseous abnormality. Areas of nodular soft tissue density in the subcutaneous fat of the anterior abdominal wall are likely related to injection sites. Small umbilical hernia contains only fat.  IMPRESSION: 1. Diffuse patchy and nodular consolidative opacity with areas of ground-glass density in both lungs. Confluent consolidative disease in the posterior right lower lobe. Imaging features compatible with multifocal pneumonia. 2. No evidence for bowel obstruction. 3. Fluid in the distal esophagus compatible with gastroesophageal reflux. 4. Cholelithiasis. 5. Multiple nonobstructing right renal stones measure up to 15 mm. 6. Left colonic diverticulosis without diverticulitis. 7. Nodular contour of the liver raises the question of cirrhosis. 8.  Aortic Atherosclerosis (ICD10-I70.0). Electronically Signed   By: Kennith Center M.D.   On: 06/07/2023 06:23   DG Chest Port 1 View Result Date: 06/06/2023 CLINICAL DATA:  Altered level of consciousness, sepsis, COPD EXAM: PORTABLE CHEST 1 VIEW COMPARISON:  05/22/2023 FINDINGS: 2 frontal views of the chest demonstrate stable right internal jugular dialysis catheter. The cardiac silhouette is unremarkable. There is  increased pulmonary vascular congestion. Bilateral airspace disease, greatest at the lung bases left greater than right. No effusion or pneumothorax. No acute bony abnormalities. IMPRESSION: 1. Progressive multifocal bilateral airspace disease, greatest at the lung bases, which could reflect worsening pneumonia or edema. Electronically Signed   By: Sharlet Salina M.D.   On: 06/06/2023 22:51      Medications:    ceFEPime (MAXIPIME) IV 1 g (06/07/23 2302)   linezolid (ZYVOX) IV 600 mg (06/07/23 2133)    ascorbic acid  250 mg Per Tube Daily   atorvastatin  20 mg Per Tube Daily   vitamin D3  2,000 Units Per Tube Daily   cholestyramine  4 g Per Tube TID   escitalopram  10 mg Per Tube Daily   famotidine  10 mg Per Tube Daily   feeding supplement (VITAL HIGH PROTEIN)  1,000 mL Per Tube Q24H   ferrous sulfate  60 mg of iron Per Tube Daily   fludrocortisone  0.2 mg Per Tube Daily   free water  30 mL Per Tube Q4H   heparin  5,000 Units  Subcutaneous Q8H   ipratropium-albuterol  3 mL Nebulization Q6H   midodrine  20 mg Per Tube TID WC   multivitamin  1 tablet Per Tube Q1400   nystatin   Topical BID   mouth rinse  15 mL Mouth Rinse 4 times per day   sodium chloride flush  10-40 mL Intracatheter Q12H   albuterol, clonazePAM, dextromethorphan-guaiFENesin, heparin, hydrOXYzine, ibuprofen, ondansetron (ZOFRAN) IV, mouth rinse, phenol, sodium chloride flush, traZODone  Assessment/ Plan:  [name] is a 73 y.o.  female with history of hypertension, coronary artery disease, congestive heart failure, COPD, cirrhosis, diabetes, status post left AKA, status post G-tube placement, and end stage renal disease on hemodialysis.  Patient presents to ED with altered mental status.  Patient has been admitted for ESRD on hemodialysis (HCC) [N18.6, Z99.2] HCAP (healthcare-associated pneumonia) [J18.9] Severe sepsis (HCC) [A41.9, R65.20]  [facility]/TTS/Rt permcath/75.6kg  End-stage renal disease with hyperkalemia: Patient received urgent dialysis yesterday, no UF due to hypotension.  Attempted to provide dialysis today as patient remains on BiPAP.  Unable to perform fluid removal due to hypotension.  Patient has received scheduled midodrine and albumin for blood pressure support without desired effect.  We feel it is not safe to proceed with dialysis at this time.  Requested termination of treatment.  Strongly encourage palliative consult for goals of care.  #2.  Acute respiratory failure, requiring BIPAP.  Patient replaced on BiPAP today.  Unable to remove fluid due to hypotension.  #3: Anemia of chronic kidney disease Lab Results  Component Value Date   HGB 10.3 (L) 06/12/2023  Hemoglobin 10.3, acceptable Continue low dose EPO with dialysis.    #4: Secondary Hyperparathyroidism: with outpatient labs: PTH 35, phosphorus 7.6, calcium 9.4 on 04/21/23.   Lab Results  Component Value Date   CALCIUM 8.6 (L) 05/26/2023    PHOS 6.6 (H) 05/26/2023    Calcium within desired range.  Will continue to monitor bone minerals.  #5 Hypotension, patient has some history of hypotension, especially during dialysis. Currently prescribed Midodrine 20 mg three times daily.  Patient received 2 doses of ordered midodrine prior to dialysis this morning, remains hypotensive.  Patient received albumin for blood pressure support prior to dialysis yesterday, remained hypotensive during treatment.   LOS: 1 Escher Harr 2/24/202511:23 AM

## 2023-06-13 NOTE — Discharge Planning (Signed)
 ESTABLISHED HEMPDIALYSIS  Outpatient Facility  [facility]  [address]  [place] [address] [phone]  Schedule: TTS 11:20AM  Dimas Chyle Dialysis Coordinator II  [facility] Cell: [phone] eFax: [phone] [email or web address]

## 2023-06-13 NOTE — Progress Notes (Signed)
 PROGRESS NOTE    Alexandra Foster   UJW:[id number] DOB: [birth year]  DOA: 06/06/2023 Date of Service: 05/20/2023 which is hospital day 1  PCP: [name], MD    Hospital course / significant events:   HPI: Alexandra Foster is a 73 y.o. female with medical history significant of s/p of G-tube, ESRD-HD (TTS), diet-controled DM, dCHF, A fib not on AC due to hx of SAH, HDL, HTN, COPD/asthma, OSA on CPAP, s/p of left AKA, who presents with SOB and AMS. Pecently hospitalized from 1/29 - 2/12 due to severe sepsis secondary to aspiration pneumonia and Klebsiella UTI. Per her daughter, patient is confused   02/22: to ED. SpO2 84% RA, respiratory distress, BiPAP started in ED. Nausea with 5 times of nonbilious nonbloody vomiting. WBC 8.9, procalcitonin 32.51, BNP  59.3, lactic acid 8.0 --> 7.2, troponin 11, negative PCR for COVID, flu and RSV, INR 1.1, PTT 26, abnormal liver function (ALP 118, AST 53, t98,22L.3), lipase 53, potassium 4.5, bicarbonate 16, creatinine 4.38, BUN 39, positive urinalysis (turbid appearance, moderate amount of leukocyte, rare bacteria, WBC > 50), chest x-ray showed multifocal infiltration.  02/23: admitted to hospitalist overnight for HCAP/aspiration pneumonia, UTI, severe sepsis. Abx Cefepime, Zyvox, (patient is allergic to vancomycin). Remains metabolic acidosis w/ increased WOB and confused, HD today and back on BiPap in the afternoon and to overnight. See IPAL note and discussion on code status below.  02/24:  agitated overnight responded to clonazepam. Tube site drainage, holding tube feeds. Remains on BiPap. HD this morning unable to complete - BP not tolerating, pt increased WOB even w/ BiPap. Decision made w/ family this afternoon for comfort measures     Consultants:  Nephrology   Procedures/Surgeries: none      ASSESSMENT & PLAN:   Comfort measures End of life care  D/c aggressive treatments, non-comfort medications/interventions, labs, etc   Morphine for pain/air hunger Benzo for anxiety/agitation Haldol if benzo ineffective Robunil prn secretions Acetaminophen prn fever  OK to d/c BiPAP once on morphine for 30 mins or so / once patient WOB eases on morphine  OK for Foley if needed OK for telemetry if desired but please alert central tele monitoring that patient is comfort measures and do not alert for abnormal rhythm, ok to alert if pt is in asystole >2 mins   Acute hypoxic respiratory failure due to HCAP (healthcare-associated pneumonia) and aspiration pneumonia  HCAP, aspiration pneumonia/pneumonitis  associated w/ severe sepsis - POA   UTI (urinary tract infection) associated w/ severe sepsis - POA   Metabolic acidosis  LUE edema  Acute metabolic encephalopathy, multifactorial d/t sepsis, uremia, hypoxia/hypercarbia   COPD (chronic obstructive pulmonary disease)   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  May also be shock liver d/t hypotension/sepsis    OSA (obstructive sleep apnea)   Anxiety and depression   Stage II decubitus ulcer sacrum POA  ADVANCED CARE PLANNING See previous progress notes / IPAL    overweight based on BMI: Body mass index is 28.91 kg/m.  Underweight - under 18  overweight - 25 to 29 obese - 30 or more Class 1 obesity: BMI of 30.0 to 34 Class 2 obesity: BMI of 35.0 to 39 Class 3  obesity: BMI of 40.0 to 49 Super Morbid Obesity: BMI 50-59 Super-super Morbid Obesity: BMI 60+ Significantly low or high BMI is associated with higher medical risk.  Weight management advised as adjunct to other disease management and risk reduction treatments    DVT prophylaxis:  n/a IV fluids: n/a Nutrition: n/a Central lines / invasive devices: ok for Foley if needed  Code Status: DNR ACP documentation reviewed:  none on file in [facility]  TOC needs: TBD Barriers to dispo / significant pending items: anticipate patient to pass in the hospital              Subjective / Brief ROS:  Patient unable to contribute She opens eyes to voice, follows basic commands, appears to recognize family members    Family Communication: husband and daughter confirm for comfort measures, multiple other family members at bedside      Objective Findings:  Vitals:   05/22/2023 0815 06/11/2023 0830 06/03/2023 0845 06/05/2023 0900  BP: 95/61 (!) 86/57 (!) 102/58 (!) 94/39  Pulse: 98 99 98 (!) 101  Resp: 11 (!) 34 (!) 34 (!) 34  Temp: 99 F (37.2 C) 98.7 F (37.1 C) 98.7 F (37.1 C)   TempSrc:  Axillary Axillary   SpO2: 90% 90% 90% 97%  Weight: 81.7 kg  81.7 kg   Height:        Intake/Output Summary (Last 24 hours) at 05/24/2023 1506 Last data filed at 05/23/2023 0845 Gross per 24 hour  Intake 1125 ml  Output 0 ml  Net 1125 ml   Filed Weights   05/16/2023 0500 05/30/2023 0815 06/02/2023 0845  Weight: 81 kg 81.7 kg 81.7 kg    Examination:  Physical Exam Constitutional:      General: She is in acute distress.     Appearance: She is ill-appearing and toxic-appearing.  Cardiovascular:     Rate and Rhythm: Regular rhythm. Tachycardia present.  Pulmonary:     Effort: Respiratory distress present.     Breath sounds: Rales present.  Abdominal:     General: Abdomen is flat.     Palpations: Abdomen is soft.  Musculoskeletal:     Right lower leg: No edema.  Skin:    General: Skin is warm and dry.  Neurological:     Mental Status: She is disoriented.          Scheduled Medications:   mouth rinse  15 mL Mouth Rinse 4 times per day    Continuous Infusions:  morphine      PRN Medications:  diphenhydrAMINE, [DISCONTINUED] glycopyrrolate **OR** [DISCONTINUED]  glycopyrrolate **OR** glycopyrrolate, haloperidol lactate, LORazepam, morphine, [DISCONTINUED] ondansetron **OR** ondansetron (ZOFRAN) IV, mouth rinse, phenol, polyvinyl alcohol  Antimicrobials from admission:  Anti-infectives (From admission, onward)    Start     Dose/Rate Route Frequency Ordered Stop   06/07/23 2200  ceFEPIme (MAXIPIME) 1 g in sodium chloride 0.9 % 100 mL IVPB  Status:  Discontinued        1 g 200 mL/hr over 30 Minutes Intravenous Every 24 hours 06/07/23 0310 06/09/2023 1443   06/07/23 0200  linezolid (ZYVOX) IVPB 600 mg  Status:  Discontinued        600 mg 300 mL/hr over 60 Minutes Intravenous Every 12 hours 06/07/23 0110 05/22/2023 1438   06/07/23 0200  metroNIDAZOLE (FLAGYL) IVPB 500 mg  Status:  Discontinued        500 mg 100 mL/hr over 60 Minutes Intravenous Every 12 hours 06/07/23 0142 06/07/23 1223   06/06/23 2230  ceFEPIme (MAXIPIME) 2 g in sodium chloride 0.9 % 100 mL IVPB        2 g 200 mL/hr over 30 Minutes Intravenous  Once 06/06/23 2215 06/06/23 2258           Data Reviewed:  I have personally reviewed the following...  CBC: Recent Labs  Lab 06/06/23 2159 06/07/23 0509 06/04/2023 0646  WBC 8.9 11.2* 13.6*  NEUTROABS 7.5  --   --   HGB 13.4 11.3* 10.3*  HCT 44.7 38.3 32.6*  MCV 88.2 89.3 84.2  PLT 317 246 220   Basic Metabolic Panel: Recent Labs  Lab 06/06/23 2159 06/07/23 0800 05/24/2023 0646  NA 135 133* 134*  K 4.5 5.2* 4.4  CL 100 103 98  CO2 16* 12* 19*  GLUCOSE 151* 132* 130*  BUN 39* 42* 36*  CREATININE 4.38* 4.52* 3.66*  CALCIUM 9.3 8.4* 8.6*   GFR: Estimated Creatinine Clearance: 13.9 mL/min (A) (by C-G formula based on SCr of 3.66 mg/dL (H)). Liver Function Tests: Recent Labs  Lab 06/06/23 2159 06/07/23 0800 06/07/2023 0646  AST 90* 71* 63*  ALT 39 39 33  ALKPHOS 118 75 62  BILITOT 1.3* 0.6 0.8  PROT 7.7 6.8 6.7  ALBUMIN 2.9* 2.5* 2.7*   Recent Labs  Lab 06/06/23 2159  LIPASE 53*   No results for input(s):  "AMMONIA" in the last 168 hours. Coagulation Profile: Recent Labs  Lab 06/06/23 2159  INR 1.1   Cardiac Enzymes: No results for input(s): "CKTOTAL", "CKMB", "CKMBINDEX", "TROPONINI" in the last 168 hours. BNP (last 3 results) No results for input(s): "PROBNP" in the last 8760 hours. HbA1C: No results for input(s): "HGBA1C" in the last 72 hours. CBG: Recent Labs  Lab 06/07/23 0759 06/07/23 1220 06/07/23 2054 05/22/2023 0028 05/25/2023 0609  GLUCAP 139* 154* 127* 158* 127*   Lipid Profile: No results for input(s): "CHOL", "HDL", "LDLCALC", "TRIG", "CHOLHDL", "LDLDIRECT" in the last 72 hours. Thyroid Function Tests: No results for input(s): "TSH", "T4TOTAL", "FREET4", "T3FREE", "THYROIDAB" in the last 72 hours. Anemia Panel: No results for input(s): "VITAMINB12", "FOLATE", "FERRITIN", "TIBC", "IRON", "RETICCTPCT" in the last 72 hours. Most Recent Urinalysis On File:     Component Value Date/Time   COLORURINE AMBER (A) 06/06/2023 2158   APPEARANCEUR TURBID (A) 06/06/2023 2158   LABSPEC 1.016 06/06/2023 2158   PHURINE 5.0 06/06/2023 2158   GLUCOSEU NEGATIVE 06/06/2023 2158   HGBUR NEGATIVE 06/06/2023 2158   BILIRUBINUR NEGATIVE 06/06/2023 2158   KETONESUR NEGATIVE 06/06/2023 2158   PROTEINUR 100 (A) 06/06/2023 2158   NITRITE NEGATIVE 06/06/2023 2158   LEUKOCYTESUR MODERATE (A) 06/06/2023 2158   Sepsis Labs: @LABRCNTIP (procalcitonin:4,lacticidven:4) Microbiology: Recent Results (from the past 240 hours)  Blood Culture (routine x 2)     Status: None (Preliminary result)   Collection Time: 06/06/23  9:50 PM   Specimen: BLOOD  Result Value Ref Range Status   Specimen Description BLOOD BLOOD LEFT ARM  Final   Special Requests   Final    BOTTLES DRAWN AEROBIC ONLY Blood Culture results may not be optimal due to an inadequate volume of blood received in culture bottles   Culture  Setup Time   Final    GRAM POSITIVE COCCI AEROBIC BOTTLE ONLY Organism ID to follow CRITICAL  RESULT CALLED TO, READ BACK BY AND VERIFIED WITH: Paschal Dopp PHARMD [id number] 05/28/2023 HNM    Culture   Final    NO GROWTH 2 DAYS Performed at [facility], [address] [place], [place]  [address]    Report Status PENDING  Incomplete  Blood Culture ID Panel (Reflexed)     Status: Abnormal   Collection Time: 06/06/23  9:50 PM  Result Value Ref Range Status   Enterococcus faecalis NOT DETECTED NOT DETECTED Final   Enterococcus Faecium NOT DETECTED NOT DETECTED Final   Listeria monocytogenes NOT DETECTED NOT DETECTED Final   Staphylococcus species DETECTED (A) NOT DETECTED Final    Comment: CRITICAL RESULT CALLED TO, READ BACK BY AND VERIFIED WITH: Paschal Dopp PHARMD 1329 06/12/2023 HNM    Staphylococcus aureus (BCID) NOT DETECTED NOT DETECTED Final   Staphylococcus epidermidis DETECTED (A) NOT DETECTED Final    Comment: Methicillin (oxacillin) resistant coagulase negative staphylococcus. Possible blood culture contaminant (unless isolated from more than one blood culture draw or clinical case suggests pathogenicity). No antibiotic treatment is indicated for blood  culture contaminants. CRITICAL RESULT CALLED TO, READ BACK BY AND VERIFIED WITH: Paschal Dopp PHARMD 1329 06/12/2023 HNM    Staphylococcus lugdunensis NOT DETECTED NOT DETECTED Final   Streptococcus species NOT DETECTED NOT DETECTED Final   Streptococcus agalactiae NOT DETECTED NOT DETECTED Final   Streptococcus pneumoniae NOT DETECTED NOT DETECTED Final   Streptococcus pyogenes NOT DETECTED NOT DETECTED Final   A.calcoaceticus-baumannii NOT DETECTED NOT DETECTED Final   Bacteroides fragilis NOT DETECTED NOT DETECTED Final   Enterobacterales NOT DETECTED NOT DETECTED Final   Enterobacter cloacae complex NOT DETECTED NOT DETECTED Final   Escherichia coli NOT DETECTED NOT DETECTED Final   Klebsiella aerogenes NOT DETECTED NOT DETECTED Final   Klebsiella oxytoca NOT DETECTED NOT DETECTED Final   Klebsiella pneumoniae NOT  DETECTED NOT DETECTED Final   Proteus species NOT DETECTED NOT DETECTED Final   Salmonella species NOT DETECTED NOT DETECTED Final   Serratia marcescens NOT DETECTED NOT DETECTED Final   Haemophilus influenzae NOT DETECTED NOT DETECTED Final   Neisseria meningitidis NOT DETECTED NOT DETECTED Final   Pseudomonas aeruginosa NOT DETECTED NOT DETECTED Final   Stenotrophomonas maltophilia NOT DETECTED NOT DETECTED Final   Candida albicans NOT DETECTED NOT DETECTED Final   Candida auris NOT DETECTED NOT DETECTED Final   Candida glabrata NOT DETECTED NOT DETECTED Final   Candida krusei NOT DETECTED NOT DETECTED Final   Candida parapsilosis NOT DETECTED NOT DETECTED Final   Candida tropicalis NOT DETECTED NOT DETECTED Final   Cryptococcus neoformans/gattii NOT DETECTED NOT DETECTED Final   Methicillin resistance mecA/C DETECTED (A) NOT DETECTED Final    Comment: CRITICAL RESULT CALLED TO, READ BACK BY AND VERIFIED WITHPaschal Dopp PHARMD 1329 05/30/2023 HNM Performed at [facility], [address] [place], [place] [address]   Blood Culture (routine x 2)     Status: None (Preliminary result)   Collection Time: 06/06/23  9:58 PM   Specimen: BLOOD  Result Value Ref Range Status   Specimen Description BLOOD RAC  Final   Special Requests   Final    BOTTLES DRAWN AEROBIC ONLY Blood Culture results may not be optimal due to an inadequate volume of blood received in culture bottles   Culture   Final    NO GROWTH 2 DAYS Performed at [facility], [address] [place], [place] [address]    Report Status PENDING  Incomplete  Urine Culture     Status: Abnormal (Preliminary result)   Collection Time: 06/06/23  9:58 PM   Specimen: Urine, Random  Result Value Ref Range Status   Specimen Description   Final    URINE, RANDOM Performed  at [facility], [address] [place], [place] [address]    Special Requests   Final    NONE Reflexed from [id number] Performed at  [facility], [address] [place], [place] [address]    Culture 50,000 COLONIES/mL [facility] MORGANII (A)  Final   Report Status PENDING  Incomplete  Resp panel by RT-PCR (RSV, Flu A&B, Covid) Anterior Nasal Swab     Status: None   Collection Time: 06/06/23  9:59 PM   Specimen: Anterior Nasal Swab  Result Value Ref Range Status   SARS Coronavirus 2 by RT PCR NEGATIVE NEGATIVE Final    Comment: (NOTE) SARS-CoV-2 target nucleic acids are NOT DETECTED.  The SARS-CoV-2 RNA is generally detectable in upper respiratory specimens during the acute phase of infection. The lowest concentration of SARS-CoV-2 viral copies this assay can detect is 138 copies/mL. A negative result does not preclude SARS-Cov-2 infection and should not be used as the sole basis for treatment or other patient management decisions. A negative result may occur with  improper specimen collection/handling, submission of specimen other than nasopharyngeal swab, presence of viral mutation(s) within the areas targeted by this assay, and inadequate number of viral copies(<138 copies/mL). A negative result must be combined with clinical observations, patient history, and epidemiological information. The expected result is Negative.  Fact Sheet for Patients:  BloggerCourse.com  Fact Sheet for Healthcare Providers:  SeriousBroker.it  This test is no t yet approved or cleared by the [place] FDA and  has been authorized for detection and/or diagnosis of SARS-CoV-2 by FDA under an Emergency Use Authorization (EUA). This EUA will remain  in effect (meaning this test can be used) for the duration of the COVID-19 declaration under Section 564(b)(1) of the Act, 21 U.S.C.section 360bbb-3(b)(1), unless the authorization is terminated  or revoked sooner.       Influenza A by PCR NEGATIVE NEGATIVE Final   Influenza B by PCR NEGATIVE NEGATIVE Final    Comment:  (NOTE) The Xpert Xpress SARS-CoV-2/FLU/RSV plus assay is intended as an aid in the diagnosis of influenza from Nasopharyngeal swab specimens and should not be used as a sole basis for treatment. Nasal washings and aspirates are unacceptable for Xpert Xpress SARS-CoV-2/FLU/RSV testing.  Fact Sheet for Patients: BloggerCourse.com  Fact Sheet for Healthcare Providers: SeriousBroker.it  This test is not yet approved or cleared by the [place] FDA and has been authorized for detection and/or diagnosis of SARS-CoV-2 by FDA under an Emergency Use Authorization (EUA). This EUA will remain in effect (meaning this test can be used) for the duration of the COVID-19 declaration under Section 564(b)(1) of the Act, 21 U.S.C. section 360bbb-3(b)(1), unless the authorization is terminated or revoked.     Resp Syncytial Virus by PCR NEGATIVE NEGATIVE Final    Comment: (NOTE) Fact Sheet for Patients: BloggerCourse.com  Fact Sheet for Healthcare Providers: SeriousBroker.it  This test is not yet approved or cleared by the [place] FDA and has been authorized for detection and/or diagnosis of SARS-CoV-2 by FDA under an Emergency Use Authorization (EUA). This EUA will remain in effect (meaning this test can be used) for the duration of the COVID-19 declaration under Section 564(b)(1) of the Act, 21 U.S.C. section 360bbb-3(b)(1), unless the authorization is terminated or revoked.  Performed at [facility], [address] [place], [place] [address]   Resp panel by RT-PCR (RSV, Flu A&B, Covid) Anterior Nasal Swab     Status: None   Collection Time: 06/07/23 12:45  PM   Specimen: Anterior Nasal Swab  Result Value Ref Range Status   SARS Coronavirus 2 by RT PCR NEGATIVE NEGATIVE Final    Comment: (NOTE) SARS-CoV-2 target nucleic acids are NOT DETECTED.  The SARS-CoV-2 RNA is  generally detectable in upper respiratory specimens during the acute phase of infection. The lowest concentration of SARS-CoV-2 viral copies this assay can detect is 138 copies/mL. A negative result does not preclude SARS-Cov-2 infection and should not be used as the sole basis for treatment or other patient management decisions. A negative result may occur with  improper specimen collection/handling, submission of specimen other than nasopharyngeal swab, presence of viral mutation(s) within the areas targeted by this assay, and inadequate number of viral copies(<138 copies/mL). A negative result must be combined with clinical observations, patient history, and epidemiological information. The expected result is Negative.  Fact Sheet for Patients:  BloggerCourse.com  Fact Sheet for Healthcare Providers:  SeriousBroker.it  This test is no t yet approved or cleared by the [place] FDA and  has been authorized for detection and/or diagnosis of SARS-CoV-2 by FDA under an Emergency Use Authorization (EUA). This EUA will remain  in effect (meaning this test can be used) for the duration of the COVID-19 declaration under Section 564(b)(1) of the Act, 21 U.S.C.section 360bbb-3(b)(1), unless the authorization is terminated  or revoked sooner.       Influenza A by PCR NEGATIVE NEGATIVE Final   Influenza B by PCR NEGATIVE NEGATIVE Final    Comment: (NOTE) The Xpert Xpress SARS-CoV-2/FLU/RSV plus assay is intended as an aid in the diagnosis of influenza from Nasopharyngeal swab specimens and should not be used as a sole basis for treatment. Nasal washings and aspirates are unacceptable for Xpert Xpress SARS-CoV-2/FLU/RSV testing.  Fact Sheet for Patients: BloggerCourse.com  Fact Sheet for Healthcare Providers: SeriousBroker.it  This test is not yet approved or cleared by the [place] FDA and has been authorized for detection and/or diagnosis of SARS-CoV-2 by FDA under an Emergency Use Authorization (EUA). This EUA will remain in effect (meaning this test can be used) for the duration of the COVID-19 declaration under Section 564(b)(1) of the Act, 21 U.S.C. section 360bbb-3(b)(1), unless the authorization is terminated or revoked.     Resp Syncytial Virus by PCR NEGATIVE NEGATIVE Final    Comment: (NOTE) Fact Sheet for Patients: BloggerCourse.com  Fact Sheet for Healthcare Providers: SeriousBroker.it  This test is not yet approved or cleared by the [place] FDA and has been authorized for detection and/or diagnosis of SARS-CoV-2 by FDA under an Emergency Use Authorization (EUA). This EUA will remain in effect (meaning this test can be used) for the duration of the COVID-19 declaration under Section 564(b)(1) of the Act, 21 U.S.C. section 360bbb-3(b)(1), unless the authorization is terminated or revoked.  Performed at [facility], [address] [place], [place] [address]   Respiratory (~20 pathogens) panel by PCR     Status: None   Collection Time: 06/07/23 12:45 PM   Specimen: Nasopharyngeal Swab; Respiratory  Result Value Ref Range Status   Adenovirus NOT DETECTED NOT DETECTED Final   Coronavirus 229E NOT DETECTED NOT DETECTED Final    Comment: (NOTE) The Coronavirus on the Respiratory Panel, DOES NOT test for the novel  Coronavirus (2019 nCoV)    Coronavirus HKU1 NOT DETECTED NOT DETECTED Final   Coronavirus NL63 NOT DETECTED NOT DETECTED Final   Coronavirus OC43 NOT DETECTED NOT DETECTED Final   Metapneumovirus NOT DETECTED NOT DETECTED  Final   Rhinovirus / Enterovirus NOT DETECTED NOT DETECTED Final   Influenza A NOT DETECTED NOT DETECTED Final   Influenza B NOT DETECTED NOT DETECTED Final   Parainfluenza Virus 1 NOT DETECTED NOT DETECTED Final   Parainfluenza Virus 2 NOT  DETECTED NOT DETECTED Final   Parainfluenza Virus 3 NOT DETECTED NOT DETECTED Final   Parainfluenza Virus 4 NOT DETECTED NOT DETECTED Final   Respiratory Syncytial Virus NOT DETECTED NOT DETECTED Final   Bordetella pertussis NOT DETECTED NOT DETECTED Final   Bordetella Parapertussis NOT DETECTED NOT DETECTED Final   Chlamydophila pneumoniae NOT DETECTED NOT DETECTED Final   Mycoplasma pneumoniae NOT DETECTED NOT DETECTED Final    Comment: Performed at [facility], [address] [place], [place] [address]  MRSA Next Gen by PCR, Nasal     Status: None   Collection Time: 06/07/23 12:45 PM   Specimen: Nasal Mucosa; Nasal Swab  Result Value Ref Range Status   MRSA by PCR Next Gen NOT DETECTED NOT DETECTED Final    Comment: (NOTE) The GeneXpert MRSA Assay (FDA approved for NASAL specimens only), is one component of a comprehensive MRSA colonization surveillance program. It is not intended to diagnose MRSA infection nor to guide or monitor treatment for MRSA infections. Test performance is not FDA approved in patients less than 85 years old. Performed at [facility], [address], [place], [place] [address]       Radiology Studies last 3 days: DG ABDOMEN PEG TUBE LOCATION Result Date: 06/01/2023 CLINICAL DATA:  [id number] Leaking PEG tube (HCC) 130865 PEG TUBE VERIFICATION PEG TUBE LEAKING 50 ML OF GASTROGRAFIN INJECTED INTO PEG TUBE FLUSHED WITH SALINE LOT # [id number] EXP 04/2025 EXAM: ABDOMEN - 1 VIEW COMPARISON:  X-ray abdomen 05/13/2023 FINDINGS: IVC filter noted overlying the right mid abdomen. Gastrostomy tube overlies the right mid abdomen. PO contrast partially opacifies the gastric lumen and first and second portion of the duodenum. No radiographic finding of extravasation of PO contrast from the bowel. The bowel gas pattern is normal. No radio-opaque calculi or other significant radiographic abnormality are seen. Partially visualized right chest wall dialysis catheter  with tip overlying the expected region of the superior caval junction. IMPRESSION: 1. Gastrostomy tube in appropriate position. 2. Nonobstructive bowel gas pattern. Electronically Signed   By: Tish Frederickson M.D.   On: 06/02/2023 09:44   CT ABDOMEN PELVIS WO CONTRAST Result Date: 06/07/2023 CLINICAL DATA:  Recent sepsis. Shortness of breath and altered mental status. Nausea and vomiting. EXAM: CT ABDOMEN AND PELVIS WITHOUT CONTRAST TECHNIQUE: Multidetector CT imaging of the abdomen and pelvis was performed following the standard protocol without IV contrast. RADIATION DOSE REDUCTION: This exam was performed according to the departmental dose-optimization program which includes automated exposure control, adjustment of the mA and/or kV according to patient size and/or use of iterative reconstruction technique. COMPARISON:  02/27/2023 FINDINGS: Lower chest: Diffuse patchy and nodular consolidative opacity with areas of ground-glass density noted in both lungs. Confluent consolidative disease is seen in the posterior right lower lobe. Fluid noted distal esophagus. Hepatobiliary: No suspicious focal abnormality within the liver parenchyma. Nodular contour raises the question of cirrhosis. 2 cm calcified gallstone evident. No intrahepatic or extrahepatic biliary dilation. Pancreas: No focal mass lesion. No dilatation of the main duct. No intraparenchymal cyst. No peripancreatic edema. Spleen: No splenomegaly. No suspicious focal mass lesion. Adrenals/Urinary Tract: No adrenal nodule or mass. Multiple nonobstructing stones in the right kidney measure up to 15 mm. No  stones are seen in the left kidney. No hydroureteronephrosis. Bladder is decompressed. Stomach/Bowel: Stomach is unremarkable. No gastric wall thickening. No evidence of outlet obstruction. Gastrostomy tube tip is positioned in the gastric lumen. Duodenum is normally positioned as is the ligament of Treitz. No small bowel wall thickening. No small bowel  dilatation. The terminal ileum is normal. The appendix is normal. No gross colonic mass. No colonic wall thickening. Diverticular changes are noted in the left colon without evidence of diverticulitis. Vascular/Lymphatic: There is mild atherosclerotic calcification of the abdominal aorta without aneurysm. IVC filter visualized. There is no gastrohepatic or hepatoduodenal ligament lymphadenopathy. No retroperitoneal or mesenteric lymphadenopathy. No pelvic sidewall lymphadenopathy. Reproductive: Hysterectomy.  There is no adnexal mass. Other: No intraperitoneal free fluid. Musculoskeletal: No worrisome lytic or sclerotic osseous abnormality. Areas of nodular soft tissue density in the subcutaneous fat of the anterior abdominal wall are likely related to injection sites. Small umbilical hernia contains only fat. IMPRESSION: 1. Diffuse patchy and nodular consolidative opacity with areas of ground-glass density in both lungs. Confluent consolidative disease in the posterior right lower lobe. Imaging features compatible with multifocal pneumonia. 2. No evidence for bowel obstruction. 3. Fluid in the distal esophagus compatible with gastroesophageal reflux. 4. Cholelithiasis. 5. Multiple nonobstructing right renal stones measure up to 15 mm. 6. Left colonic diverticulosis without diverticulitis. 7. Nodular contour of the liver raises the question of cirrhosis. 8.  Aortic Atherosclerosis (ICD10-I70.0). Electronically Signed   By: Kennith Center M.D.   On: 06/07/2023 06:23   DG Chest Port 1 View Result Date: 06/06/2023 CLINICAL DATA:  Altered level of consciousness, sepsis, COPD EXAM: PORTABLE CHEST 1 VIEW COMPARISON:  05/22/2023 FINDINGS: 2 frontal views of the chest demonstrate stable right internal jugular dialysis catheter. The cardiac silhouette is unremarkable. There is increased pulmonary vascular congestion. Bilateral airspace disease, greatest at the lung bases left greater than right. No effusion or  pneumothorax. No acute bony abnormalities. IMPRESSION: 1. Progressive multifocal bilateral airspace disease, greatest at the lung bases, which could reflect worsening pneumonia or edema. Electronically Signed   By: Sharlet Salina M.D.   On: 06/06/2023 22:51       Time spent: 50 min     Sunnie Nielsen, DO [facility] 06/01/2023, 3:06 PM    Dictation software may have been used to generate the above note. Typos may occur and escape review in typed/dictated notes. Please contact Dr Lyn Hollingshead directly for clarity if needed.  Staff may message me via secure chat in Epic  but this may not receive an immediate response,  please page me for urgent matters!  If 7PM-7AM, please contact night coverage [email or web address]

## 2023-06-13 NOTE — Progress Notes (Signed)
   06/01/2023 1530  Spiritual Encounters  Type of Visit Initial  Care provided to: Pt and family  Conversation partners present during encounter Nurse  Referral source Nurse (RN/NT/LPN)  Reason for visit End-of-life  OnCall Visit Yes   Chaplain received the end of life [place] Consult in EPIC and as the on call Chaplain, visited the patient's room.  Family shared that their own pastor was present and they didn't need the Chaplain to come.  Chaplain shared with Nurse and closed the [facility] Consult.     Rev. Rana M. Earlene Plater, MDiv Chaplain Resident [facility]

## 2023-06-13 NOTE — Progress Notes (Signed)
 Patient report given to Amandeep on 1C for transfer. Family notified at bedside. Patient transferred down by transport and respiratory.

## 2023-06-13 NOTE — Consult Note (Signed)
 PHARMACY - PHYSICIAN COMMUNICATION CRITICAL VALUE ALERT - BLOOD CULTURE IDENTIFICATION (BCID)  Alexandra Foster is an 73 y.o. female who presented to [facility] on 06/06/2023 with a chief complaint of SOB and AMS.   Assessment: 1/4 bottles GPC; BCID detected MRSE  Name of physician (or Provider) Contacted: Dr. Lyn Hollingshead  Current antibiotics: Cefepime & Linezolid  Changes to prescribed antibiotics recommended: Continue current antibiotics - awaiting GOC decision  Results for orders placed or performed during the hospital encounter of 06/06/23  Blood Culture ID Panel (Reflexed) (Collected: 06/06/2023  9:50 PM)  Result Value Ref Range   Enterococcus faecalis NOT DETECTED NOT DETECTED   Enterococcus Faecium NOT DETECTED NOT DETECTED   Listeria monocytogenes NOT DETECTED NOT DETECTED   Staphylococcus species DETECTED (A) NOT DETECTED   Staphylococcus aureus (BCID) NOT DETECTED NOT DETECTED   Staphylococcus epidermidis DETECTED (A) NOT DETECTED   Staphylococcus lugdunensis NOT DETECTED NOT DETECTED   Streptococcus species NOT DETECTED NOT DETECTED   Streptococcus agalactiae NOT DETECTED NOT DETECTED   Streptococcus pneumoniae NOT DETECTED NOT DETECTED   Streptococcus pyogenes NOT DETECTED NOT DETECTED   A.calcoaceticus-baumannii NOT DETECTED NOT DETECTED   Bacteroides fragilis NOT DETECTED NOT DETECTED   Enterobacterales NOT DETECTED NOT DETECTED   Enterobacter cloacae complex NOT DETECTED NOT DETECTED   Escherichia coli NOT DETECTED NOT DETECTED   Klebsiella aerogenes NOT DETECTED NOT DETECTED   Klebsiella oxytoca NOT DETECTED NOT DETECTED   Klebsiella pneumoniae NOT DETECTED NOT DETECTED   Proteus species NOT DETECTED NOT DETECTED   Salmonella species NOT DETECTED NOT DETECTED   Serratia marcescens NOT DETECTED NOT DETECTED   Haemophilus influenzae NOT DETECTED NOT DETECTED   Neisseria meningitidis NOT DETECTED NOT DETECTED   Pseudomonas aeruginosa NOT DETECTED NOT DETECTED    Stenotrophomonas maltophilia NOT DETECTED NOT DETECTED   Candida albicans NOT DETECTED NOT DETECTED   Candida auris NOT DETECTED NOT DETECTED   Candida glabrata NOT DETECTED NOT DETECTED   Candida krusei NOT DETECTED NOT DETECTED   Candida parapsilosis NOT DETECTED NOT DETECTED   Candida tropicalis NOT DETECTED NOT DETECTED   Cryptococcus neoformans/gattii NOT DETECTED NOT DETECTED   Methicillin resistance mecA/C DETECTED (A) NOT DETECTED   Littie Deeds, PharmD Pharmacy Resident  05/24/2023 1:39 PM

## 2023-06-13 NOTE — Plan of Care (Signed)
  Problem: Respiratory: Goal: Ability to maintain a clear airway will improve Outcome: Progressing   Problem: Respiratory: Goal: Levels of oxygenation will improve Outcome: Progressing   Problem: Respiratory: Goal: Ability to maintain adequate ventilation will improve Outcome: Progressing   Problem: Clinical Measurements: Goal: Ability to maintain a body temperature in the normal range will improve Outcome: Progressing   Problem: Respiratory: Goal: Ability to maintain adequate ventilation will improve Outcome: Progressing

## 2023-06-13 NOTE — Progress Notes (Signed)
 Patient off the floor at this time for dialysis. Transferred with respiratory for continuous BiPAP.

## 2023-06-13 DEATH — deceased

## 2023-06-22 ENCOUNTER — Ambulatory Visit: Payer: Self-pay | Admitting: Family Medicine

## 2023-06-24 ENCOUNTER — Encounter: Payer: Medicare Other | Admitting: *Deleted
